# Patient Record
Sex: Female | Born: 1950 | Race: White | Hispanic: No | Marital: Married | Smoking: Never smoker
Health system: Southern US, Community
[De-identification: ages and names within clinical notes are randomized; demographics above are authoritative.]

## PROBLEM LIST (undated history)

## (undated) DIAGNOSIS — Z8619 Personal history of other infectious and parasitic diseases: Secondary | ICD-10-CM

## (undated) DIAGNOSIS — Z9981 Dependence on supplemental oxygen: Secondary | ICD-10-CM

## (undated) DIAGNOSIS — I2721 Secondary pulmonary arterial hypertension: Secondary | ICD-10-CM

## (undated) DIAGNOSIS — I509 Heart failure, unspecified: Secondary | ICD-10-CM

## (undated) DIAGNOSIS — L97509 Non-pressure chronic ulcer of other part of unspecified foot with unspecified severity: Secondary | ICD-10-CM

## (undated) DIAGNOSIS — R0609 Other forms of dyspnea: Secondary | ICD-10-CM

## (undated) DIAGNOSIS — I10 Essential (primary) hypertension: Secondary | ICD-10-CM

## (undated) DIAGNOSIS — E039 Hypothyroidism, unspecified: Secondary | ICD-10-CM

## (undated) DIAGNOSIS — L97909 Non-pressure chronic ulcer of unspecified part of unspecified lower leg with unspecified severity: Secondary | ICD-10-CM

## (undated) DIAGNOSIS — M1990 Unspecified osteoarthritis, unspecified site: Secondary | ICD-10-CM

## (undated) DIAGNOSIS — I779 Disorder of arteries and arterioles, unspecified: Secondary | ICD-10-CM

## (undated) DIAGNOSIS — R0600 Dyspnea, unspecified: Secondary | ICD-10-CM

## (undated) DIAGNOSIS — C8590 Non-Hodgkin lymphoma, unspecified, unspecified site: Secondary | ICD-10-CM

## (undated) DIAGNOSIS — R040 Epistaxis: Secondary | ICD-10-CM

## (undated) DIAGNOSIS — I272 Pulmonary hypertension, unspecified: Secondary | ICD-10-CM

## (undated) DIAGNOSIS — D649 Anemia, unspecified: Secondary | ICD-10-CM

## (undated) DIAGNOSIS — M349 Systemic sclerosis, unspecified: Secondary | ICD-10-CM

## (undated) DIAGNOSIS — C439 Malignant melanoma of skin, unspecified: Secondary | ICD-10-CM

## (undated) HISTORY — DX: Dyspnea, unspecified: R06.00

## (undated) HISTORY — DX: Personal history of other infectious and parasitic diseases: Z86.19

## (undated) HISTORY — DX: Non-pressure chronic ulcer of other part of unspecified foot with unspecified severity: L97.509

## (undated) HISTORY — DX: Secondary pulmonary arterial hypertension: I27.21

## (undated) HISTORY — DX: Essential (primary) hypertension: I10

## (undated) HISTORY — DX: Non-Hodgkin lymphoma, unspecified, unspecified site: C85.90

## (undated) HISTORY — DX: Systemic sclerosis, unspecified: M34.9

## (undated) HISTORY — PX: OTHER SURGICAL HISTORY: SHX169

## (undated) HISTORY — PX: TONSILLECTOMY AND ADENOIDECTOMY: SHX28

## (undated) HISTORY — DX: Disorder of arteries and arterioles, unspecified: I77.9

## (undated) HISTORY — DX: Malignant melanoma of skin, unspecified: C43.9

## (undated) HISTORY — DX: Other forms of dyspnea: R06.09

## (undated) HISTORY — PX: TONSILLECTOMY: SUR1361

## (undated) SURGERY — Surgical Case
Anesthesia: *Unknown

---

## 1997-12-31 DIAGNOSIS — C439 Malignant melanoma of skin, unspecified: Secondary | ICD-10-CM

## 1997-12-31 HISTORY — DX: Malignant melanoma of skin, unspecified: C43.9

## 1998-05-10 ENCOUNTER — Ambulatory Visit (HOSPITAL_COMMUNITY): Admission: RE | Admit: 1998-05-10 | Discharge: 1998-05-10 | Payer: Self-pay | Admitting: Dermatology

## 1998-10-07 ENCOUNTER — Encounter: Admission: RE | Admit: 1998-10-07 | Discharge: 1999-01-05 | Payer: Self-pay | Admitting: Internal Medicine

## 1998-10-18 ENCOUNTER — Ambulatory Visit (HOSPITAL_COMMUNITY): Admission: RE | Admit: 1998-10-18 | Discharge: 1998-10-18 | Payer: Self-pay | Admitting: Orthopedic Surgery

## 1998-10-19 ENCOUNTER — Encounter (HOSPITAL_BASED_OUTPATIENT_CLINIC_OR_DEPARTMENT_OTHER): Payer: Self-pay | Admitting: Internal Medicine

## 1998-10-31 HISTORY — PX: OTHER SURGICAL HISTORY: SHX169

## 1998-11-07 ENCOUNTER — Encounter: Admission: RE | Admit: 1998-11-07 | Discharge: 1998-11-07 | Payer: Self-pay | Admitting: Infectious Diseases

## 1998-11-10 ENCOUNTER — Ambulatory Visit (HOSPITAL_COMMUNITY): Admission: RE | Admit: 1998-11-10 | Discharge: 1998-11-10 | Payer: Self-pay | Admitting: Orthopedic Surgery

## 2000-01-05 ENCOUNTER — Ambulatory Visit (HOSPITAL_COMMUNITY): Admission: RE | Admit: 2000-01-05 | Discharge: 2000-01-05 | Payer: Self-pay | Admitting: Rheumatology

## 2000-01-29 ENCOUNTER — Encounter: Admission: RE | Admit: 2000-01-29 | Discharge: 2000-01-29 | Payer: Self-pay | Admitting: Dermatology

## 2000-01-29 ENCOUNTER — Encounter: Payer: Self-pay | Admitting: Dermatology

## 2000-02-27 ENCOUNTER — Other Ambulatory Visit: Admission: RE | Admit: 2000-02-27 | Discharge: 2000-02-27 | Payer: Self-pay | Admitting: *Deleted

## 2000-02-28 ENCOUNTER — Other Ambulatory Visit: Admission: RE | Admit: 2000-02-28 | Discharge: 2000-02-28 | Payer: Self-pay | Admitting: *Deleted

## 2000-03-22 ENCOUNTER — Encounter: Admission: RE | Admit: 2000-03-22 | Discharge: 2000-03-22 | Payer: Self-pay | Admitting: Internal Medicine

## 2000-03-22 ENCOUNTER — Encounter: Payer: Self-pay | Admitting: Internal Medicine

## 2000-10-07 ENCOUNTER — Ambulatory Visit (HOSPITAL_COMMUNITY): Admission: RE | Admit: 2000-10-07 | Discharge: 2000-10-07 | Payer: Self-pay | Admitting: Rheumatology

## 2001-01-10 ENCOUNTER — Encounter: Payer: Self-pay | Admitting: Dermatology

## 2001-01-10 ENCOUNTER — Encounter: Admission: RE | Admit: 2001-01-10 | Discharge: 2001-01-10 | Payer: Self-pay | Admitting: Dermatology

## 2001-03-26 ENCOUNTER — Encounter: Admission: RE | Admit: 2001-03-26 | Discharge: 2001-03-26 | Payer: Self-pay | Admitting: Obstetrics and Gynecology

## 2001-03-26 ENCOUNTER — Encounter: Payer: Self-pay | Admitting: Obstetrics and Gynecology

## 2001-08-08 ENCOUNTER — Ambulatory Visit (HOSPITAL_COMMUNITY): Admission: RE | Admit: 2001-08-08 | Discharge: 2001-08-08 | Payer: Self-pay | Admitting: Rheumatology

## 2002-03-30 ENCOUNTER — Encounter: Payer: Self-pay | Admitting: Obstetrics & Gynecology

## 2002-03-30 ENCOUNTER — Encounter: Admission: RE | Admit: 2002-03-30 | Discharge: 2002-03-30 | Payer: Self-pay | Admitting: Obstetrics & Gynecology

## 2002-05-15 ENCOUNTER — Ambulatory Visit (HOSPITAL_COMMUNITY): Admission: RE | Admit: 2002-05-15 | Discharge: 2002-05-15 | Payer: Self-pay | Admitting: Rheumatology

## 2003-03-25 ENCOUNTER — Other Ambulatory Visit: Admission: RE | Admit: 2003-03-25 | Discharge: 2003-03-25 | Payer: Self-pay | Admitting: Obstetrics & Gynecology

## 2003-04-13 ENCOUNTER — Encounter: Admission: RE | Admit: 2003-04-13 | Discharge: 2003-04-13 | Payer: Self-pay | Admitting: Rheumatology

## 2003-04-13 ENCOUNTER — Encounter: Payer: Self-pay | Admitting: Rheumatology

## 2003-05-26 ENCOUNTER — Ambulatory Visit (HOSPITAL_COMMUNITY): Admission: RE | Admit: 2003-05-26 | Discharge: 2003-05-26 | Payer: Self-pay | Admitting: Rheumatology

## 2004-05-08 ENCOUNTER — Other Ambulatory Visit: Admission: RE | Admit: 2004-05-08 | Discharge: 2004-05-08 | Payer: Self-pay | Admitting: Obstetrics & Gynecology

## 2004-06-05 ENCOUNTER — Encounter: Admission: RE | Admit: 2004-06-05 | Discharge: 2004-06-05 | Payer: Self-pay | Admitting: Obstetrics & Gynecology

## 2006-12-31 DIAGNOSIS — J189 Pneumonia, unspecified organism: Secondary | ICD-10-CM

## 2006-12-31 HISTORY — DX: Pneumonia, unspecified organism: J18.9

## 2010-02-28 HISTORY — PX: OTHER SURGICAL HISTORY: SHX169

## 2010-08-12 LAB — HM MAMMOGRAPHY

## 2011-06-04 LAB — HM PAP SMEAR

## 2011-06-19 ENCOUNTER — Encounter: Payer: Self-pay | Admitting: Internal Medicine

## 2011-08-10 ENCOUNTER — Encounter: Payer: Self-pay | Admitting: Internal Medicine

## 2011-08-10 ENCOUNTER — Ambulatory Visit (INDEPENDENT_AMBULATORY_CARE_PROVIDER_SITE_OTHER): Payer: BC Managed Care – PPO | Admitting: Internal Medicine

## 2011-08-10 DIAGNOSIS — F3289 Other specified depressive episodes: Secondary | ICD-10-CM

## 2011-08-10 DIAGNOSIS — I10 Essential (primary) hypertension: Secondary | ICD-10-CM | POA: Insufficient documentation

## 2011-08-10 DIAGNOSIS — G629 Polyneuropathy, unspecified: Secondary | ICD-10-CM

## 2011-08-10 DIAGNOSIS — Z9109 Other allergy status, other than to drugs and biological substances: Secondary | ICD-10-CM

## 2011-08-10 DIAGNOSIS — F329 Major depressive disorder, single episode, unspecified: Secondary | ICD-10-CM

## 2011-08-10 DIAGNOSIS — G589 Mononeuropathy, unspecified: Secondary | ICD-10-CM

## 2011-08-10 DIAGNOSIS — F32A Depression, unspecified: Secondary | ICD-10-CM

## 2011-08-10 DIAGNOSIS — I739 Peripheral vascular disease, unspecified: Secondary | ICD-10-CM

## 2011-08-10 DIAGNOSIS — I743 Embolism and thrombosis of arteries of the lower extremities: Secondary | ICD-10-CM | POA: Insufficient documentation

## 2011-08-10 DIAGNOSIS — L940 Localized scleroderma [morphea]: Secondary | ICD-10-CM

## 2011-08-10 DIAGNOSIS — I779 Disorder of arteries and arterioles, unspecified: Secondary | ICD-10-CM | POA: Insufficient documentation

## 2011-08-10 DIAGNOSIS — E785 Hyperlipidemia, unspecified: Secondary | ICD-10-CM

## 2011-08-10 DIAGNOSIS — Z0000 Encounter for general adult medical examination without abnormal findings: Secondary | ICD-10-CM

## 2011-08-10 DIAGNOSIS — Z889 Allergy status to unspecified drugs, medicaments and biological substances status: Secondary | ICD-10-CM

## 2011-08-10 MED ORDER — TRAMADOL HCL 50 MG PO TABS: 50.0000 mg | ORAL_TABLET | Freq: Four times a day (QID) | ORAL | Status: DC | PRN

## 2011-08-10 MED ORDER — CITALOPRAM HYDROBROMIDE 20 MG PO TABS: 20.0000 mg | ORAL_TABLET | Freq: Every day | ORAL | Status: DC

## 2011-08-10 NOTE — Progress Notes (Signed)
Subjective:    [name], female    DOB: 29-Jul-1951, 60 y.o.   MRN: [id number]  HPI Patient is a new patient to establish with a history of scleroderma.  She has been told in the past that she has Raynaud's and peripheral artery occlusive disease scleroderma which has been seemingly localized to her skin hypertension which has been moderately well controlled and hyperlipidemia.  She has never been on an ACE inhibitor she has taken steroids for her scleroderma in the past.  When they lived in the [place] area she had both a rheumatologist and a cardiologist.  Those records will be obtained and reviewed. She did not have primary care physician nor has she had a recent physical examination.  She is unaware of how her scleroderma was diagnosed specifically she is unaware of an ANA titer or any sedimentation rate  information.  She certainly takes gabapentin and tramadol for pain to lower extremities and is on Celexa for depression he takes Lipitor for hyperlipidemia    Review of Systems  Constitutional: Negative for activity change, appetite change and fatigue.  HENT: Negative for ear pain, congestion, neck pain, postnasal drip and sinus pressure.   Eyes: Negative for redness and visual disturbance.  Respiratory: Positive for shortness of breath. Negative for cough and wheezing.   Cardiovascular: Positive for palpitations and leg swelling.  Gastrointestinal: Negative for abdominal pain and abdominal distention.  Genitourinary: Negative for dysuria, frequency and menstrual problem.  Musculoskeletal: Negative for myalgias, joint swelling and arthralgias.  Skin: Negative for rash and wound.  Neurological: Negative for dizziness, weakness and headaches.  Hematological: Negative for adenopathy. Does not bruise/bleed easily.  Psychiatric/Behavioral: Negative for sleep disturbance and decreased concentration.   Past Medical History  Diagnosis Date  . Cancer   . History  of chicken pox   . Hypertension   . Sclerodermia generalized   . Non Hodgkin's lymphoma   . Melanoma 1999   Past Surgical History  Procedure Date  . Rt little toe removal 10/1998  . Stent left thigh 3/11  . Tonsillectomy and adenoidectomy   . Small intestine surgery     reports that she has never smoked. She does not have any smokeless tobacco history on file. She reports that she does not drink alcohol or use illicit drugs. family history includes Hyperlipidemia in her mother; Lupus in her father; and Lymphoma in her father. Allergies  Allergen Reactions  . Penicillins        Objective:   Physical Exam  Nursing note and vitals reviewed. Constitutional: She is oriented to person, place, and time. She appears well-developed and well-nourished. No distress.  HENT:  Head: Normocephalic and atraumatic.  Right Ear: External ear normal.  Left Ear: External ear normal.  Nose: Nose normal.  Mouth/Throat: Oropharynx is clear and moist.  Eyes: Conjunctivae and EOM are normal. Pupils are equal, round, and reactive to light.  Neck: Normal range of motion. Neck supple. No JVD present. No tracheal deviation present. No thyromegaly present.  Cardiovascular: Normal rate, regular rhythm, normal heart sounds and intact distal pulses.   No murmur heard. Pulmonary/Chest: Effort normal and breath sounds normal. She has no wheezes. She exhibits no tenderness.  Abdominal: Soft. Bowel sounds are normal.  Musculoskeletal: Normal range of motion. She exhibits no edema and no tenderness.  Lymphadenopathy:    She has no cervical adenopathy.  Neurological: She is alert and oriented to person, place, and time. She has normal reflexes. No cranial nerve  deficit.  Skin: Skin is warm and dry. She is not diaphoretic.  Psychiatric: She has a normal mood and affect. Her behavior is normal.          Assessment & Plan:  Ace for scleroderma? We will review an ANA and also her previous rheumatological  history she is on Aldactone to for blood pressure. I do believe that she may benefit from the addition of an Ace especially if the diagnosis of scleroderma is true we will obtain appropriate lab work to confirm this diagnosis as well as review what her vitamin D levels are her lipids and liver panel and look at a sedimentation rate.  I believe this complicated patient will require a team to manage her including cardiology for a history of hypertension peripheral vascular disease and rheumatology if indeed the diagnosis of scleroderma as correct.  We have asked the patient to obtain records from previous physicians which over the

## 2011-08-10 NOTE — Patient Instructions (Signed)
b12 dots subligual 500 mg twice a day

## 2011-08-11 LAB — BASIC METABOLIC PANEL
BUN: 23 mg/dL (ref 6–23)
CO2: 25 mEq/L (ref 19–32)
Calcium: 10.2 mg/dL (ref 8.4–10.5)
Chloride: 97 mEq/L (ref 96–112)
Creat: 1.1 mg/dL (ref 0.50–1.10)
Glucose, Bld: 86 mg/dL (ref 70–99)
Potassium: 3.9 mEq/L (ref 3.5–5.3)
Sodium: 140 mEq/L (ref 135–145)

## 2011-08-11 LAB — VITAMIN B12: Vitamin B-12: 781 pg/mL (ref 211–911)

## 2011-08-11 LAB — SEDIMENTATION RATE: Sed Rate: 34 mm/hr — ABNORMAL HIGH (ref 0–22)

## 2011-08-13 LAB — ANTI-NUCLEAR AB-TITER (ANA TITER): ANA Titer 1: 1:320 {titer} — ABNORMAL HIGH

## 2011-08-13 LAB — ANA: Anti Nuclear Antibody(ANA): POSITIVE — AB

## 2011-10-12 ENCOUNTER — Other Ambulatory Visit: Payer: Self-pay | Admitting: *Deleted

## 2011-10-12 ENCOUNTER — Ambulatory Visit (INDEPENDENT_AMBULATORY_CARE_PROVIDER_SITE_OTHER): Payer: BC Managed Care – PPO | Admitting: Internal Medicine

## 2011-10-12 VITALS — BP 120/74 | HR 72 | Temp 98.2°F | Resp 16 | Ht 68.0 in | Wt 152.0 lb

## 2011-10-12 DIAGNOSIS — M792 Neuralgia and neuritis, unspecified: Secondary | ICD-10-CM

## 2011-10-12 DIAGNOSIS — G5790 Unspecified mononeuropathy of unspecified lower limb: Secondary | ICD-10-CM

## 2011-10-12 MED ORDER — AMITRIPTYLINE HCL 10 MG PO TABS: 10.0000 mg | ORAL_TABLET | Freq: Every day | ORAL | Status: DC

## 2011-10-12 NOTE — Progress Notes (Signed)
  Subjective:    [name], female    DOB: 16-Jan-1951, 60 y.o.   MRN: [id number]  HPI Pt will continue with the [facility] Rheumatologist and cardiologist She will need an echo to look at right sided heart pressures This has been set up for march The [place] information is in the  Chart [facility]   At [facility] The cardiologist is a [place]  The ANA was normal for her per the rheumatologist   Review of Systems  Constitutional: Negative for activity change, appetite change and fatigue.  HENT: Negative for ear pain, congestion, neck pain, postnasal drip and sinus pressure.   Eyes: Negative for redness and visual disturbance.  Respiratory: Negative for cough, shortness of breath and wheezing.   Gastrointestinal: Negative for abdominal pain and abdominal distention.  Genitourinary: Negative for dysuria, frequency and menstrual problem.  Musculoskeletal: Negative for myalgias, joint swelling and arthralgias.  Skin: Negative for rash and wound.  Neurological: Negative for dizziness, weakness and headaches.  Hematological: Negative for adenopathy. Does not bruise/bleed easily.  Psychiatric/Behavioral: Negative for sleep disturbance and decreased concentration.   Past Medical History  Diagnosis Date  . Cancer   . History of chicken pox   . Hypertension   . Sclerodermia generalized   . Non Hodgkin's lymphoma   . Melanoma 1999   Past Surgical History  Procedure Date  . Rt little toe removal 10/1998  . Stent left thigh 3/11  . Tonsillectomy and adenoidectomy   . Small intestine surgery     reports that she has never smoked. She does not have any smokeless tobacco history on file. She reports that she does not drink alcohol or use illicit drugs. family history includes Hyperlipidemia in her mother; Lupus in her father; and Lymphoma in her father. Allergies  Allergen Reactions  . Penicillins        Objective:   Physical Exam  Nursing note and vitals  reviewed. Constitutional: She is oriented to person, place, and time. She appears well-developed and well-nourished. No distress.  HENT:  Head: Normocephalic and atraumatic.  Right Ear: External ear normal.  Left Ear: External ear normal.  Nose: Nose normal.  Mouth/Throat: Oropharynx is clear and moist.  Eyes: Conjunctivae and EOM are normal. Pupils are equal, round, and reactive to light.  Neck: Normal range of motion. Neck supple. No JVD present. No tracheal deviation present. No thyromegaly present.  Cardiovascular: Normal rate, regular rhythm, normal heart sounds and intact distal pulses.   No murmur heard. Pulmonary/Chest: Effort normal and breath sounds normal. She has no wheezes. She exhibits no tenderness.  Abdominal: Soft. Bowel sounds are normal.  Musculoskeletal: Normal range of motion. She exhibits no edema and no tenderness.  Lymphadenopathy:    She has no cervical adenopathy.  Neurological: She is alert and oriented to person, place, and time. She has normal reflexes. No cranial nerve deficit.  Skin: Skin is warm and dry. She is not diaphoretic.  Psychiatric: She has a normal mood and affect. Her behavior is normal.          Assessment & Plan:  Neuropathy not improved Started lyrica 4 weeks a go on 100 BID Trial of elavil 25

## 2011-10-12 NOTE — Patient Instructions (Signed)
Patient was instructed to continue all medications as prescribed. To stop at the checkout desk and schedule a followup appointment

## 2011-11-19 ENCOUNTER — Encounter (HOSPITAL_COMMUNITY): Payer: Self-pay | Admitting: *Deleted

## 2011-11-19 ENCOUNTER — Emergency Department (HOSPITAL_COMMUNITY)
Admission: EM | Admit: 2011-11-19 | Discharge: 2011-11-19 | Disposition: A | Discharge status: Home / Self Care | Payer: BC Managed Care – PPO | Attending: Emergency Medicine | Admitting: Emergency Medicine

## 2011-11-19 ENCOUNTER — Encounter (HOSPITAL_COMMUNITY): Payer: Self-pay | Admitting: Emergency Medicine

## 2011-11-19 ENCOUNTER — Emergency Department (HOSPITAL_COMMUNITY)
Admission: EM | Admit: 2011-11-19 | Discharge: 2011-11-19 | Disposition: A | Discharge status: Home / Self Care | Payer: BC Managed Care – PPO | Source: Home / Self Care | Attending: Emergency Medicine | Admitting: Emergency Medicine

## 2011-11-19 DIAGNOSIS — Z7982 Long term (current) use of aspirin: Secondary | ICD-10-CM | POA: Insufficient documentation

## 2011-11-19 DIAGNOSIS — R040 Epistaxis: Secondary | ICD-10-CM | POA: Insufficient documentation

## 2011-11-19 DIAGNOSIS — J3489 Other specified disorders of nose and nasal sinuses: Secondary | ICD-10-CM | POA: Insufficient documentation

## 2011-11-19 DIAGNOSIS — Z79899 Other long term (current) drug therapy: Secondary | ICD-10-CM | POA: Insufficient documentation

## 2011-11-19 DIAGNOSIS — I10 Essential (primary) hypertension: Secondary | ICD-10-CM | POA: Insufficient documentation

## 2011-11-19 MED ORDER — COCAINE HCL 4 % EX SOLN
4.0000 mL | Freq: Once | CUTANEOUS | Status: AC
  Administered 2011-11-19: 4 mL via NASAL
  Filled 2011-11-19: qty 4

## 2011-11-19 NOTE — ED Notes (Signed)
Pt states she started having a nose bleed 3 hours ago

## 2011-11-19 NOTE — ED Notes (Signed)
Pt was discharged from ER with packing to left nares. Pt blew nose and some clots came out. Pt returned for further eval. Packing remains in place. No active bleeding noted.

## 2011-11-19 NOTE — ED Notes (Signed)
Pt sitting quietly in bed. No distress noted. No active bleeding noted.

## 2011-11-19 NOTE — ED Provider Notes (Addendum)
History     CSN: [id number] Arrival date & time: 11/19/2011  2:32 AM   First MD Initiated Contact with Patient 11/19/11 0252      Chief Complaint  Patient presents with  . Epistaxis    (Consider location/radiation/quality/duration/timing/severity/associated sxs/prior treatment) HPI Patient returns the emergency room this evening after being discharged short time ago with complaints of recurrent nosebleed.  Patient has history of perforated nasal septum. While in the emergency room earlier she had her left nares packed with a Rhinostat. Patient went out to the car this evening and tried to blow her nose to clear out the right nares. She started to feel that the blood was coming out of the left side again so she returned to the emergency room. Currently the bleeding has stopped but the nasal packing does look bloodier than  before.    . Past Medical History  Diagnosis Date  . Cancer   . History of chicken pox   . Hypertension   . Sclerodermia generalized   . Non Hodgkin's lymphoma   . Melanoma 1999    Past Surgical History  Procedure Date  . Rt little toe removal 10/1998  . Stent left thigh 3/11  . Tonsillectomy and adenoidectomy   . Small intestine surgery     Family History  Problem Relation Age of Onset  . Lupus Father   . Lymphoma Father   . Hyperlipidemia Mother     History  Substance Use Topics  . Smoking status: Never Smoker   . Smokeless tobacco: Not on file  . Alcohol Use: Yes     couple of times a week    OB History    Grav Para Term Preterm Abortions TAB SAB Ect Mult Living                  Review of Systems  All other systems reviewed and are negative.    Allergies  Penicillins  Home Medications   Current Outpatient Rx  Name Route Sig Dispense Refill  . AMITRIPTYLINE HCL 10 MG PO TABS Oral Take 1 tablet (10 mg total) by mouth at bedtime. 30 tablet 11  . ASPIRIN 81 MG PO TABS Oral Take 81 mg by mouth daily.      . ATORVASTATIN CALCIUM  40 MG PO TABS Oral Take 40 mg by mouth daily.      . AZELASTINE HCL 137 MCG/SPRAY NA SOLN Nasal Place 1 spray into the nose daily. Use in each nostril as directed    . VITAMIN D 2000 UNITS PO CAPS Oral Take by mouth daily.      Marland Kitchen CITALOPRAM HYDROBROMIDE 20 MG PO TABS Oral Take 1 tablet (20 mg total) by mouth daily. 30 tablet 11  . CLOPIDOGREL BISULFATE 75 MG PO TABS Oral Take 75 mg by mouth daily.      . FUROSEMIDE 20 MG PO TABS Oral Take 20 mg by mouth 2 (two) times daily.      Marland Kitchen GABAPENTIN 300 MG PO CAPS Oral Take 1 tablet by mouth 2 (two) times daily.    . MOMETASONE FUROATE 50 MCG/ACT NA SUSP Nasal Place 2 sprays into the nose daily.      . MULTIVITAMINS PO TABS Oral Take 1 tablet by mouth daily.      Marland Kitchen MUPIROCIN 2 % EX OINT Oral Take 1 application by mouth 2 (two) times daily.    Marland Kitchen NEXIUM 40 MG PO CPDR Oral Take 1 tablet by mouth Daily.    Marland Kitchen  OMEGA 3 1000 MG PO CAPS Oral Take 3 capsules by mouth daily.      Marland Kitchen SPIRONOLACTONE 25 MG PO TABS Oral Take 25 mg by mouth daily.        There were no vitals taken for this visit.  Physical Exam  Nursing note and vitals reviewed. Constitutional: She appears well-developed and well-nourished. No distress.  HENT:  Head: Normocephalic and atraumatic.  Right Ear: External ear normal.  Left Ear: External ear normal.       No blood noted in posterior pharynx, Rhinostat in the left nares, no active bleeding noted from the left or right time.  Eyes: Conjunctivae are normal. Right eye exhibits no discharge. Left eye exhibits no discharge. No scleral icterus.  Neck: Neck supple. No tracheal deviation present.  Cardiovascular: Normal rate.   Pulmonary/Chest: Effort normal. No stridor. No respiratory distress.  Musculoskeletal: She exhibits no edema.  Neurological: She is alert. Cranial nerve deficit: no gross deficits.  Skin: Skin is warm and dry. No rash noted.  Psychiatric: She has a normal mood and affect.    ED Course  Procedures (including  critical care time)  Labs Reviewed - No data to display No results found.   1. Epistaxis       MDM  The patient was monitored in the emergency room for a couple of hours. There is no return of the epistaxis. Her packing remained in place        Kathalene Frames, MD 11/19/11 5929  Kathalene Frames, MD 11/27/11 [phone]

## 2011-11-19 NOTE — ED Notes (Signed)
Pt provided with warm blanket and diet sprite. Husband provided with diet sprite as well. No active bleeding noted.

## 2011-11-19 NOTE — ED Notes (Signed)
Pt sitting in bed quietly. No active bleeding noted.

## 2011-11-19 NOTE — ED Provider Notes (Signed)
History     CSN: [id number] Arrival date & time: 11/19/2011 12:42 AM   First MD Initiated Contact with Patient 11/19/11 0049      Chief Complaint  Patient presents with  . Epistaxis    (Consider location/radiation/quality/duration/timing/severity/associated sxs/prior treatment) HPI Comments: The patient denies any history of bleeding disorder. She's not having any numbness or weakness.  Patient is a 60 y.o. female presenting with nosebleeds. The history is provided by the patient.  Epistaxis  This is a new problem. The current episode started 3 to 5 hours ago. The problem occurs constantly. The problem has not changed since onset.The problem is associated with aspirin. The bleeding has been from both nares. She has tried applying pressure for the symptoms. The treatment provided mild relief. Her past medical history does not include sinus problems or frequent nosebleeds. Past medical history comments: The patient does have a history of a perforated nasal septum.    Past Medical History  Diagnosis Date  . Cancer   . History of chicken pox   . Hypertension   . Sclerodermia generalized   . Non Hodgkin's lymphoma   . Melanoma 1999    Past Surgical History  Procedure Date  . Rt little toe removal 10/1998  . Stent left thigh 3/11  . Tonsillectomy and adenoidectomy   . Small intestine surgery     Family History  Problem Relation Age of Onset  . Lupus Father   . Lymphoma Father   . Hyperlipidemia Mother     History  Substance Use Topics  . Smoking status: Never Smoker   . Smokeless tobacco: Not on file  . Alcohol Use: Yes     couple of times a week    OB History    Grav Para Term Preterm Abortions TAB SAB Ect Mult Living                  Review of Systems  HENT: Positive for nosebleeds.   All other systems reviewed and are negative.    Allergies  Penicillins  Home Medications   Current Outpatient Rx  Name Route Sig Dispense Refill  . AMITRIPTYLINE HCL  10 MG PO TABS Oral Take 1 tablet (10 mg total) by mouth at bedtime. 30 tablet 11  . ASPIRIN 81 MG PO TABS Oral Take 81 mg by mouth daily.      . ATORVASTATIN CALCIUM 40 MG PO TABS Oral Take 40 mg by mouth daily.      . AZELASTINE HCL 137 MCG/SPRAY NA SOLN Nasal Place 1 spray into the nose 2 (two) times daily. Use in each nostril as directed     . VITAMIN D 2000 UNITS PO CAPS Oral Take by mouth daily.      Marland Kitchen CITALOPRAM HYDROBROMIDE 20 MG PO TABS Oral Take 1 tablet (20 mg total) by mouth daily. 30 tablet 11  . CLOPIDOGREL BISULFATE 75 MG PO TABS Oral Take 75 mg by mouth daily.      . FUROSEMIDE 20 MG PO TABS Oral Take 20 mg by mouth 2 (two) times daily.      . MOMETASONE FUROATE 50 MCG/ACT NA SUSP Nasal Place 2 sprays into the nose daily.      . MULTIVITAMINS PO TABS Oral Take 1 tablet by mouth daily.      . OMEGA 3 1000 MG PO CAPS Oral Take 3 capsules by mouth daily.      Marland Kitchen PREGABALIN 100 MG PO CAPS Oral Take 100 mg by mouth  2 (two) times daily.      Marland Kitchen SPIRONOLACTONE 25 MG PO TABS Oral Take 25 mg by mouth daily.        BP 161/92  Pulse 97  Temp(Src) 98.7 F (37.1 C) (Oral)  Resp 18  SpO2 94%  Physical Exam  Nursing note and vitals reviewed. Constitutional: She appears well-developed and well-nourished. No distress.  HENT:  Head: Normocephalic and atraumatic.  Right Ear: External ear normal.  Left Ear: External ear normal.  Nose: Epistaxis is observed.  Mouth/Throat: No oropharyngeal exudate.       Perforated nasal septum, clotted blood in both nares  Eyes: Conjunctivae are normal. Right eye exhibits no discharge. Left eye exhibits no discharge. No scleral icterus.  Neck: Neck supple. No tracheal deviation present.  Cardiovascular: Normal rate and regular rhythm.   Pulmonary/Chest: Effort normal and breath sounds normal. No stridor. No respiratory distress. She has no wheezes. She has no rales.  Musculoskeletal: She exhibits no edema.  Neurological: She is alert. Cranial nerve  deficit: no gross deficits.  Skin: Skin is warm and dry. No rash noted.  Psychiatric: She has a normal mood and affect.    ED Course  EPISTAXIS MANAGEMENT Date/Time: 11/19/2011 1:39 AM Performed by: Dorie Rank R Authorized by: Dorie Rank R Consent: Verbal consent obtained. Risks and benefits: risks, benefits and alternatives were discussed Consent given by: patient Patient sedated: no Treatment site: left anterior Repair method: nasal balloon (Rhinostat anterior 5.5 cm  packing) Post-procedure assessment: bleeding stopped Treatment complexity: simple Patient tolerance: Patient tolerated the procedure well with no immediate complications.   (including critical care time)  Labs Reviewed - No data to display No results found.  Diagnosis: Epistaxis   MDM  Patient monitored in the ED. No further bleeding noted. He should be discharged home to followup with an ear nose and throat doctor.        Kathalene Frames, MD 11/19/11 [phone]

## 2011-12-05 ENCOUNTER — Encounter: Payer: Self-pay | Admitting: Internal Medicine

## 2011-12-05 ENCOUNTER — Ambulatory Visit (INDEPENDENT_AMBULATORY_CARE_PROVIDER_SITE_OTHER): Payer: BC Managed Care – PPO | Admitting: Internal Medicine

## 2011-12-05 ENCOUNTER — Ambulatory Visit: Payer: BC Managed Care – PPO | Admitting: Internal Medicine

## 2011-12-05 VITALS — BP 130/80 | HR 76 | Temp 98.2°F | Resp 16 | Ht 68.0 in | Wt 152.0 lb

## 2011-12-05 DIAGNOSIS — I998 Other disorder of circulatory system: Secondary | ICD-10-CM

## 2011-12-05 DIAGNOSIS — I999 Unspecified disorder of circulatory system: Secondary | ICD-10-CM

## 2011-12-05 DIAGNOSIS — G5790 Unspecified mononeuropathy of unspecified lower limb: Secondary | ICD-10-CM

## 2011-12-05 DIAGNOSIS — R300 Dysuria: Secondary | ICD-10-CM

## 2011-12-05 DIAGNOSIS — I872 Venous insufficiency (chronic) (peripheral): Secondary | ICD-10-CM

## 2011-12-05 DIAGNOSIS — M792 Neuralgia and neuritis, unspecified: Secondary | ICD-10-CM

## 2011-12-05 LAB — POCT URINALYSIS DIPSTICK
Bilirubin, UA: NEGATIVE
Blood, UA: NEGATIVE
Glucose, UA: NEGATIVE
Ketones, UA: NEGATIVE
Nitrite, UA: NEGATIVE
Protein, UA: NEGATIVE
Spec Grav, UA: <=1.005
Urobilinogen, UA: 0.2
pH, UA: 5.5

## 2011-12-05 MED ORDER — AMITRIPTYLINE HCL 25 MG PO TABS: 25.0000 mg | ORAL_TABLET | Freq: Every day | ORAL | Status: DC

## 2011-12-05 MED ORDER — CIPROFLOXACIN HCL 250 MG PO TABS: 250.0000 mg | ORAL_TABLET | Freq: Two times a day (BID) | ORAL | Status: AC

## 2011-12-05 NOTE — Progress Notes (Signed)
Subjective:    [name], female    DOB: 1951-06-13, 60 y.o.   MRN: [id number]  HPI  Follow up for CTD, HTN and depression Elavil started for the neuropathy The pt cannot detect any change in the neuropathy bu she sleeps better at night Has change back to neurontin from lyrica for the same diagnosis The pt has been on b12 orally Blood pressure stable Diagnosed with scleroderma seeing once a year Had been on reclast for bone density and the specialist has recommended skipping a year  Review of Systems  Constitutional: Positive for fatigue.  HENT: Negative.   Eyes: Negative.   Respiratory: Positive for wheezing.   Genitourinary: Positive for frequency.  Musculoskeletal: Positive for arthralgias.  Skin: Positive for color change.  Neurological: Positive for weakness and light-headedness.  / Past Medical History  Diagnosis Date  . Cancer   . History of chicken pox   . Hypertension   . Sclerodermia generalized   . Non Hodgkin's lymphoma   . Melanoma 1999    History   Social History  . Marital Status: Married    Spouse Name: N/A    Number of Children: N/A  . Years of Education: N/A   Occupational History  . Not on file.   Social History Main Topics  . Smoking status: Never Smoker   . Smokeless tobacco: Not on file  . Alcohol Use: Yes     couple of times a week  . Drug Use: No  . Sexually Active: Not Currently   Other Topics Concern  . Not on file   Social History Narrative  . No narrative on file    Past Surgical History  Procedure Date  . Rt little toe removal 10/1998  . Stent left thigh 3/11  . Tonsillectomy and adenoidectomy   . Small intestine surgery     Family History  Problem Relation Age of Onset  . Lupus Father   . Lymphoma Father   . Hyperlipidemia Mother     Allergies  Allergen Reactions  . Penicillins Rash    Current Outpatient Prescriptions on File Prior to Visit  Medication Sig Dispense Refill  . aspirin 81 MG  tablet Take 81 mg by mouth daily.        Marland Kitchen atorvastatin (LIPITOR) 40 MG tablet Take 40 mg by mouth daily.        Marland Kitchen azelastine (ASTELIN) 137 MCG/SPRAY nasal spray Place 1 spray into the nose daily. Use in each nostril as directed      . Cholecalciferol (VITAMIN D) 2000 UNITS CAPS Take by mouth daily.        . citalopram (CELEXA) 20 MG tablet Take 1 tablet (20 mg total) by mouth daily.  30 tablet  11  . clopidogrel (PLAVIX) 75 MG tablet Take 75 mg by mouth daily.        . furosemide (LASIX) 20 MG tablet Take 20 mg by mouth 2 (two) times daily.        Marland Kitchen gabapentin (NEURONTIN) 300 MG capsule Take 1 tablet by mouth 2 (two) times daily.      . mometasone (NASONEX) 50 MCG/ACT nasal spray Place 2 sprays into the nose daily.        . multivitamin (THERAGRAN) per tablet Take 1 tablet by mouth daily.        . mupirocin ointment (BACTROBAN) 2 % Take 1 application by mouth 2 (two) times daily.      Marland Kitchen NEXIUM 40 MG capsule  Take 1 tablet by mouth Daily.      . OMEGA 3 1000 MG CAPS Take 3 capsules by mouth daily.        Marland Kitchen spironolactone (ALDACTONE) 25 MG tablet Take 25 mg by mouth daily.          BP 130/80  Pulse 76  Temp 98.2 F (36.8 C)  Resp 16  Ht 5' 8" (1.727 m)  Wt 152 lb (68.947 kg)  BMI 23.11 kg/m2        Objective:   Physical Exam  Nursing note and vitals reviewed.  Patient is a 60 year old white female HEENT reveals pupils are equal round reactive to light and accommodation neck is supple without JVD or peduncles were clear to auscultation and percussion heart examination revealed regular rate and rhythm with 1/6 systolic murmur abdomen soft bowel sounds present all 4 quadrants examination of the upper extremities revealed small ulcerations on the fingertips and scleroderma changes. Examination of lower extremities reveals 2+ edema good capillary refill multiple small ulcerations and chronic skin changes       Assessment & Plan:  Scleroderma stable per her  rheumatologist.  Peripheral neuropathy increase Elavil to 25 mg by mouth each bedtime both venous and vascular insufficiency of the lower subcutaneous followed by vascular surgeon at 10 pound compression stockings for the most of the edema and poor wound healing  check urinalysis for frequency some

## 2011-12-05 NOTE — Progress Notes (Signed)
Addended by: Allyne Gee on: 12/05/2011 12:50 PM   Modules accepted: Orders

## 2011-12-05 NOTE — Patient Instructions (Addendum)
The patient is instructed to continue all medications as prescribed. Schedule followup with check out clerk upon leaving the clinic Get the hose for compression of the legs

## 2011-12-12 ENCOUNTER — Ambulatory Visit: Payer: BC Managed Care – PPO | Admitting: Internal Medicine

## 2012-01-08 ENCOUNTER — Encounter: Payer: Self-pay | Admitting: Family

## 2012-01-08 ENCOUNTER — Telehealth: Payer: Self-pay | Admitting: *Deleted

## 2012-01-08 ENCOUNTER — Ambulatory Visit (INDEPENDENT_AMBULATORY_CARE_PROVIDER_SITE_OTHER): Payer: BC Managed Care – PPO | Admitting: Family

## 2012-01-08 VITALS — BP 120/70 | Temp 97.5°F | Wt 158.0 lb

## 2012-01-08 DIAGNOSIS — L299 Pruritus, unspecified: Secondary | ICD-10-CM

## 2012-01-08 DIAGNOSIS — T7840XA Allergy, unspecified, initial encounter: Secondary | ICD-10-CM

## 2012-01-08 DIAGNOSIS — M349 Systemic sclerosis, unspecified: Secondary | ICD-10-CM

## 2012-01-08 DIAGNOSIS — Z888 Allergy status to other drugs, medicaments and biological substances status: Secondary | ICD-10-CM

## 2012-01-08 MED ORDER — PREDNISONE 20 MG PO TABS: 20.0000 mg | ORAL_TABLET | Freq: Every day | ORAL | Status: AC

## 2012-01-08 MED ORDER — FLUCONAZOLE 150 MG PO TABS: 150.0000 mg | ORAL_TABLET | Freq: Once | ORAL | Status: AC

## 2012-01-08 MED ORDER — METHYLPREDNISOLONE ACETATE PF 40 MG/ML IJ SUSP
80.0000 mg | Freq: Once | INTRAMUSCULAR | Status: AC
  Administered 2012-01-08: 80 mg via INTRAMUSCULAR

## 2012-01-08 NOTE — Telephone Encounter (Signed)
Keep OV for today.

## 2012-01-08 NOTE — Telephone Encounter (Signed)
Pt is taking Zpack and Bactrim x one week or more, and has had hives x 2-3 days.  Spoke to Dr. Allyson Sabal, and he advised her to start Benadry 50 mg. q 4 hours, and stop Bactrim.  She has done that, but is still having residual rash, and has progressed to her face.  No SOB, throat closing, or other symptoms of of anaphylactic reaction.  Itching has stopped.  Pt was sheduled this afternoon with go see one of the MDs here.  Advised if any other symptoms (as above) to go straight to ER., or call 911

## 2012-01-08 NOTE — Progress Notes (Signed)
Subjective:    [name], female    DOB: 1951/09/20, 61 y.o.   MRN: [id number]  HPI 61 year old white female who complete allergic reaction 2 possibly Bactrim. She was started on Bactrim by the dermatologist for management of her scleroderma. She is contacted the dermatologist who his encouraged her to take Benadryl for symptoms, and it has helped some but she continues to have swelling and itching. She is also on Zithromax for respiratory infection. She has tolerated Zithromax 5 in the past. To her knowledge, she is never been on Bactrim. She denies any shortness of breath, chest pain, palpitations, nausea or vomiting.   Review of Systems  HENT: Positive for facial swelling.   Eyes: Negative.   Respiratory: Negative.   Cardiovascular: Negative.   Skin:        Scleroderma. A red, itchy , rash noted to the arms and chest.  Neurological: Negative.   Hematological: Negative.        Past Medical History  Diagnosis Date  . Cancer   . History of chicken pox   . Hypertension   . Sclerodermia generalized   . Non Hodgkin's lymphoma   . Melanoma 1999    History   Social History  . Marital Status: Married    Spouse Name: N/A    Number of Children: N/A  . Years of Education: N/A   Occupational History  . Not on file.   Social History Main Topics  . Smoking status: Never Smoker   . Smokeless tobacco: Not on file  . Alcohol Use: Yes     couple of times a week  . Drug Use: No  . Sexually Active: Not Currently   Other Topics Concern  . Not on file   Social History Narrative  . No narrative on file    Past Surgical History  Procedure Date  . Rt little toe removal 10/1998  . Stent left thigh 3/11  . Tonsillectomy and adenoidectomy   . Small intestine surgery     Family History  Problem Relation Age of Onset  . Lupus Father   . Lymphoma Father   . Hyperlipidemia Mother     Allergies  Allergen Reactions  . Penicillins Rash    Current Outpatient  Prescriptions on File Prior to Visit  Medication Sig Dispense Refill  . amitriptyline (ELAVIL) 25 MG tablet Take 1 tablet (25 mg total) by mouth at bedtime.  30 tablet  11  . aspirin 81 MG tablet Take 81 mg by mouth daily.        Marland Kitchen atorvastatin (LIPITOR) 40 MG tablet Take 40 mg by mouth daily.        Marland Kitchen azelastine (ASTELIN) 137 MCG/SPRAY nasal spray Place 1 spray into the nose daily. Use in each nostril as directed      . Cholecalciferol (VITAMIN D) 2000 UNITS CAPS Take by mouth daily.        . citalopram (CELEXA) 20 MG tablet Take 1 tablet (20 mg total) by mouth daily.  30 tablet  11  . clopidogrel (PLAVIX) 75 MG tablet Take 75 mg by mouth daily.        . furosemide (LASIX) 20 MG tablet Take 20 mg by mouth 2 (two) times daily.        Marland Kitchen gabapentin (NEURONTIN) 300 MG capsule Take 1 tablet by mouth 2 (two) times daily.      . mometasone (NASONEX) 50 MCG/ACT nasal spray Place 2 sprays into the nose  daily.        . multivitamin (THERAGRAN) per tablet Take 1 tablet by mouth daily.        . mupirocin ointment (BACTROBAN) 2 % Take 1 application by mouth 2 (two) times daily.      Marland Kitchen NEXIUM 40 MG capsule Take 1 tablet by mouth Daily.      . OMEGA 3 1000 MG CAPS Take 3 capsules by mouth daily.        Marland Kitchen spironolactone (ALDACTONE) 25 MG tablet Take 25 mg by mouth daily.        . vitamin B-12 (CYANOCOBALAMIN) 500 MCG tablet Take 500 mcg by mouth daily.         No current facility-administered medications on file prior to visit.    BP 120/70  Temp(Src) 97.5 F (36.4 C) (Oral)  Wt 158 lb (71.668 kg)chart Objective:   Physical Exam  Constitutional: She is oriented to person, place, and time. She appears well-developed and well-nourished.  HENT:  Right Ear: External ear normal.  Nose: Nose normal.  Mouth/Throat: Oropharynx is clear and moist.       Mild facial swelling noted, mild , pruritic rash noted. No difficulty breathing.  Neck: Normal range of motion. Neck supple.  Cardiovascular: Normal rate,  regular rhythm and normal heart sounds.   Pulmonary/Chest: Effort normal and breath sounds normal.       Great air movement.  Neurological: She is alert and oriented to person, place, and time.  Skin: Skin is warm and dry. Rash noted.       Red,  Pruritic rash noted to the face, arms bilaterally and torso.  Psychiatric: She has a normal mood and affect.          Assessment & Plan:  Assessment: Allergic reaction to medication, pruritus  Plan: Depo-Medrol 80 mg IM given x1. DC Bactrim. Continue azithromycin. Prednisone 20 mg 1 by mouth daily x5 days. Followup dermatology for further management of her scleroderma.

## 2012-01-08 NOTE — Patient Instructions (Signed)
Drug Allergy Allergic reactions to medicines are common. Some allergic reactions are mild. A delayed type of drug allergy that occurs 1 week or more after exposure to a medicine or vaccine is called serum sickness. A life-threatening, sudden (acute) allergic reaction that involves the whole body is called anaphylaxis. CAUSES  "True" drug allergies occur when there is an allergic reaction to a medicine. This is caused by overactivity of the immune system. First, the body becomes sensitized. The immune system is triggered by your first exposure to the medicine. Following this first exposure, future exposure to the same medicine may be life-threatening. Almost any medicine can cause an allergic reaction. Common ones are:  Penicillin.   Sulfonamides (sulfa drugs).   Local anesthetics.   X-ray dyes that contain iodine.  SYMPTOMS  Common symptoms of a minor allergic reaction are:  Swelling around the mouth.   An itchy red rash or hives.   Vomiting or diarrhea.  Anaphylaxis can cause swelling of the mouth and throat. This makes it difficult to breathe and swallow. Severe reactions can be fatal within seconds, even after exposure to only a trace amount of the drug that causes the reaction. HOME CARE INSTRUCTIONS   If you are unsure of what caused your reaction, keep a diary of foods and medicines used. Include the symptoms that followed. Avoid anything that causes reactions.   You may want to follow up with an allergy specialist after the reaction has cleared in order to be tested to confirm the allergy. It is important to confirm that your reaction is an allergy, not just a side effect to the medicine. If you have a true allergy to a medicine, this may prevent that medicine and related medicines from being given to you when you are very ill.   If you have hives or a rash:   Take medicines as directed by your caregiver.   You may use an over-the-counter antihistamine (diphenhydramine) as  needed.   Apply cold compresses to the skin or take baths in cool water. Avoid hot baths or showers.   If you are severely allergic:   Continuous observation after a severe reaction may be needed. Hospitalization is often required.   Wear a medical alert bracelet or necklace stating your allergy.   You and your family must learn how to use an anaphylaxis kit or give an epinephrine injection to temporarily treat an emergency allergic reaction. If you have had a severe reaction, always carry your epinephrine injection or anaphylaxis kit with you. This can be lifesaving if you have a severe reaction.   Do not drive or perform tasks after treatment until the medicines used to treat your reaction have worn off, or until your caregiver says it is okay.  SEEK MEDICAL CARE IF:   You think you had an allergic reaction. Symptoms usually start within 30 minutes after exposure.   Symptoms are getting worse rather than better.   You develop new symptoms.   The symptoms that brought you to your caregiver return.  SEEK IMMEDIATE MEDICAL CARE IF:   You have swelling of the mouth, difficulty breathing, or wheezing.   You have a tight feeling in your chest or throat.   You develop hives, swelling, or itching all over your body.   You develop severe vomiting or diarrhea.   You feel faint or pass out.  This is an emergency. Use your epinephrine injection or anaphylaxis kit as you have been instructed. Call for emergency medical help. Even  if you improve after the injection, you need to be examined at a hospital emergency department. MAKE SURE YOU:   Understand these instructions.   Will watch your condition.   Will get help right away if you are not doing well or get worse.  Document Released: 12/17/2005 Document Revised: 08/29/2011 Document Reviewed: 05/23/2011 [name] Patient Information 2012 [place].

## 2012-01-11 ENCOUNTER — Telehealth: Payer: Self-pay | Admitting: *Deleted

## 2012-01-11 NOTE — Telephone Encounter (Addendum)
Pt saw Padonda this week, and was given Prednisone.  She feels it is making her feel worse and wants to discontinue it.  Please advise.  Rash is gone, but all she does is sleep.

## 2012-01-11 NOTE — Telephone Encounter (Signed)
Okay to discontinue prednisone

## 2012-01-11 NOTE — Telephone Encounter (Signed)
Notified pt. 

## 2012-02-07 ENCOUNTER — Encounter: Payer: Self-pay | Admitting: Internal Medicine

## 2012-02-07 ENCOUNTER — Ambulatory Visit (INDEPENDENT_AMBULATORY_CARE_PROVIDER_SITE_OTHER): Payer: BC Managed Care – PPO | Admitting: Internal Medicine

## 2012-02-07 VITALS — BP 138/80 | HR 76 | Temp 98.3°F | Resp 16 | Ht 68.0 in | Wt 152.0 lb

## 2012-02-07 DIAGNOSIS — G589 Mononeuropathy, unspecified: Secondary | ICD-10-CM

## 2012-02-07 DIAGNOSIS — T887XXA Unspecified adverse effect of drug or medicament, initial encounter: Secondary | ICD-10-CM

## 2012-02-07 DIAGNOSIS — Z23 Encounter for immunization: Secondary | ICD-10-CM

## 2012-02-07 DIAGNOSIS — Z0000 Encounter for general adult medical examination without abnormal findings: Secondary | ICD-10-CM

## 2012-02-07 DIAGNOSIS — G629 Polyneuropathy, unspecified: Secondary | ICD-10-CM

## 2012-02-07 DIAGNOSIS — M349 Systemic sclerosis, unspecified: Secondary | ICD-10-CM

## 2012-02-07 MED ORDER — GABAPENTIN 300 MG PO CAPS: 300.0000 mg | ORAL_CAPSULE | Freq: Three times a day (TID) | ORAL | Status: DC

## 2012-02-07 MED ORDER — DIAZEPAM 10 MG PO TABS: 10.0000 mg | ORAL_TABLET | Freq: Every evening | ORAL | Status: AC | PRN

## 2012-02-07 NOTE — Progress Notes (Signed)
Subjective:    [name], female    DOB: Jun 28, 1951, 62 y.o.   MRN: [id number]  HPI The patient had ulcers on the leg was seen by dermatology she was placed on Silvadene cream and sulfa trimethoprim and oral antibiotic.  She developed a diffuse burning rash with sulfa antibiotic was seen by our advanced clinical practitioner recognized as a drug allergy and the severity of which mandated a prednisone course.  The sulfa drug was withdrawn the prednisone course resulted in the resolution of the skin reaction before it developed into a more serious syndrome.  The patient is continuing to apply the Silvadene cream to the legs with slow but steady progress.   Review of Systems  Constitutional: Negative for activity change, appetite change and fatigue.  HENT: Negative for ear pain, congestion, neck pain, postnasal drip and sinus pressure.   Eyes: Negative for redness and visual disturbance.  Respiratory: Negative for cough, shortness of breath and wheezing.   Gastrointestinal: Negative for abdominal pain and abdominal distention.  Genitourinary: Negative for dysuria, frequency and menstrual problem.  Musculoskeletal: Negative for myalgias, joint swelling and arthralgias.  Skin: Negative for rash and wound.  Neurological: Negative for dizziness, weakness and headaches.  Hematological: Negative for adenopathy. Does not bruise/bleed easily.  Psychiatric/Behavioral: Negative for sleep disturbance and decreased concentration.   The patient is instructed to continue all medications as prescribed. Schedule followup with check out clerk upon leaving the clinic Past Medical History  Diagnosis Date  . Cancer   . History of chicken pox   . Hypertension   . Sclerodermia generalized   . Non Hodgkin's lymphoma   . Melanoma 1999    History   Social History  . Marital Status: Married    Spouse Name: N/A    Number of Children: N/A  . Years of Education: N/A   Occupational  History  . Not on file.   Social History Main Topics  . Smoking status: Never Smoker   . Smokeless tobacco: Not on file  . Alcohol Use: Yes     couple of times a week  . Drug Use: No  . Sexually Active: Not Currently   Other Topics Concern  . Not on file   Social History Narrative  . No narrative on file    Past Surgical History  Procedure Date  . Rt little toe removal 10/1998  . Stent left thigh 3/11  . Tonsillectomy and adenoidectomy   . Small intestine surgery     Family History  Problem Relation Age of Onset  . Lupus Father   . Lymphoma Father   . Hyperlipidemia Mother     Allergies  Allergen Reactions  . Sulfa Antibiotics     rash  . Penicillins Rash    Current Outpatient Prescriptions on File Prior to Visit  Medication Sig Dispense Refill  . amitriptyline (ELAVIL) 25 MG tablet Take 1 tablet (25 mg total) by mouth at bedtime.  30 tablet  11  . aspirin 81 MG tablet Take 81 mg by mouth daily.        Marland Kitchen atorvastatin (LIPITOR) 40 MG tablet Take 40 mg by mouth daily.        Marland Kitchen azelastine (ASTELIN) 137 MCG/SPRAY nasal spray Place 1 spray into the nose daily. Use in each nostril as directed      . Cholecalciferol (VITAMIN D) 2000 UNITS CAPS Take by mouth daily.        . citalopram (CELEXA) 20 MG tablet  Take 1 tablet (20 mg total) by mouth daily.  30 tablet  11  . clopidogrel (PLAVIX) 75 MG tablet Take 75 mg by mouth daily.        . furosemide (LASIX) 20 MG tablet Take 20 mg by mouth 2 (two) times daily.        Marland Kitchen gabapentin (NEURONTIN) 300 MG capsule Take 1 tablet by mouth 2 (two) times daily.      . mometasone (NASONEX) 50 MCG/ACT nasal spray Place 2 sprays into the nose daily.        . multivitamin (THERAGRAN) per tablet Take 1 tablet by mouth daily.        . mupirocin ointment (BACTROBAN) 2 % Take 1 application by mouth 2 (two) times daily.      Marland Kitchen NEXIUM 40 MG capsule Take 1 tablet by mouth Daily.      . OMEGA 3 1000 MG CAPS Take 3 capsules by mouth daily.          Marland Kitchen spironolactone (ALDACTONE) 25 MG tablet Take 25 mg by mouth daily.        . vitamin B-12 (CYANOCOBALAMIN) 500 MCG tablet Take 500 mcg by mouth daily.          BP 138/80  Pulse 76  Temp 98.3 F (36.8 C)  Resp 16  Ht _0  (1.727 m)  Wt 152 lb (68.947 kg)  BMI 23.11 kg/m2         Objective:   Physical Exam  Nursing note and vitals reviewed. Constitutional: She is oriented to person, place, and time. She appears well-developed and well-nourished. No distress.  HENT:  Head: Normocephalic and atraumatic.  Right Ear: External ear normal.  Left Ear: External ear normal.  Nose: Nose normal.  Mouth/Throat: Oropharynx is clear and moist.  Eyes: Conjunctivae and EOM are normal. Pupils are equal, round, and reactive to light.  Neck: Normal range of motion. Neck supple. No JVD present. No tracheal deviation present. No thyromegaly present.  Cardiovascular: Normal rate, regular rhythm, normal heart sounds and intact distal pulses.   No murmur heard. Pulmonary/Chest: Effort normal and breath sounds normal. She has no wheezes. She exhibits no tenderness.  Abdominal: Soft. Bowel sounds are normal.  Musculoskeletal: Normal range of motion. She exhibits no edema and no tenderness.  Lymphadenopathy:    She has no cervical adenopathy.  Neurological: She is alert and oriented to person, place, and time. She has normal reflexes. No cranial nerve deficit.  Skin: Skin is warm and dry. She is not diaphoretic.  Psychiatric: She has a normal mood and affect. Her behavior is normal.          Assessment & Plan:  Do to her scleroderma she will require an EGD and this time for colonoscopy.  We have referred her to gastroenterology to have this done.  The patient passed worsening peripheral neuropathy and it's keeping her awake at night this is partially neuropathic and partially vascular we will increase the Neurontin to 600 mg at night and give her diazepam for rest.  She will require a  zostervax We will continue to monitor her with her rheumatologist.

## 2012-02-07 NOTE — Patient Instructions (Signed)
The patient is instructed to continue all medications as prescribed. Schedule followup with check out clerk upon leaving the clinic

## 2012-02-07 NOTE — Progress Notes (Signed)
Addended by: Allyne Gee on: 02/07/2012 12:11 PM   Modules accepted: Orders

## 2012-02-28 ENCOUNTER — Telehealth: Payer: Self-pay | Admitting: Internal Medicine

## 2012-02-28 DIAGNOSIS — Z889 Allergy status to unspecified drugs, medicaments and biological substances status: Secondary | ICD-10-CM

## 2012-02-28 MED ORDER — MOMETASONE FUROATE 50 MCG/ACT NA SUSP: 2.0000 | Freq: Every day | NASAL | Status: DC

## 2012-02-28 MED ORDER — AZELASTINE HCL 0.1 % NA SOLN: 1.0000 | Freq: Every day | NASAL | Status: DC

## 2012-02-28 NOTE — Telephone Encounter (Signed)
Patient called stating that she needs refills on her nasonex 50 mcg and astlin 137 mcg called into [facility]. Please assist.

## 2012-03-14 ENCOUNTER — Other Ambulatory Visit: Payer: Self-pay | Admitting: *Deleted

## 2012-03-14 ENCOUNTER — Other Ambulatory Visit: Payer: Self-pay | Admitting: Internal Medicine

## 2012-03-14 DIAGNOSIS — I272 Pulmonary hypertension, unspecified: Secondary | ICD-10-CM

## 2012-03-14 DIAGNOSIS — E785 Hyperlipidemia, unspecified: Secondary | ICD-10-CM

## 2012-03-14 DIAGNOSIS — D869 Sarcoidosis, unspecified: Secondary | ICD-10-CM

## 2012-03-14 MED ORDER — ATORVASTATIN CALCIUM 40 MG PO TABS: 40.0000 mg | ORAL_TABLET | Freq: Every day | ORAL | Status: DC

## 2012-03-14 NOTE — Telephone Encounter (Signed)
Pt needs new rx generic lipitor 39m call into [facility] [address] [phone] Pt will be bringing a copy of abnormal echocardiogram for dr jArnoldo Moral[place]

## 2012-03-18 ENCOUNTER — Encounter: Payer: Self-pay | Admitting: Internal Medicine

## 2012-03-24 ENCOUNTER — Encounter (HOSPITAL_BASED_OUTPATIENT_CLINIC_OR_DEPARTMENT_OTHER): Payer: BC Managed Care – PPO | Attending: Internal Medicine

## 2012-03-24 DIAGNOSIS — Z79899 Other long term (current) drug therapy: Secondary | ICD-10-CM | POA: Insufficient documentation

## 2012-03-24 DIAGNOSIS — Z85820 Personal history of malignant melanoma of skin: Secondary | ICD-10-CM | POA: Insufficient documentation

## 2012-03-24 DIAGNOSIS — M349 Systemic sclerosis, unspecified: Secondary | ICD-10-CM | POA: Insufficient documentation

## 2012-03-24 DIAGNOSIS — I7300 Raynaud's syndrome without gangrene: Secondary | ICD-10-CM | POA: Insufficient documentation

## 2012-03-24 DIAGNOSIS — Z9221 Personal history of antineoplastic chemotherapy: Secondary | ICD-10-CM | POA: Insufficient documentation

## 2012-03-24 DIAGNOSIS — G5790 Unspecified mononeuropathy of unspecified lower limb: Secondary | ICD-10-CM | POA: Insufficient documentation

## 2012-03-24 DIAGNOSIS — I2789 Other specified pulmonary heart diseases: Secondary | ICD-10-CM | POA: Insufficient documentation

## 2012-03-24 DIAGNOSIS — Z7982 Long term (current) use of aspirin: Secondary | ICD-10-CM | POA: Insufficient documentation

## 2012-03-24 DIAGNOSIS — S98139A Complete traumatic amputation of one unspecified lesser toe, initial encounter: Secondary | ICD-10-CM | POA: Insufficient documentation

## 2012-03-24 DIAGNOSIS — I10 Essential (primary) hypertension: Secondary | ICD-10-CM | POA: Insufficient documentation

## 2012-03-24 DIAGNOSIS — Z87898 Personal history of other specified conditions: Secondary | ICD-10-CM | POA: Insufficient documentation

## 2012-03-24 DIAGNOSIS — L97809 Non-pressure chronic ulcer of other part of unspecified lower leg with unspecified severity: Secondary | ICD-10-CM | POA: Insufficient documentation

## 2012-03-25 ENCOUNTER — Ambulatory Visit (INDEPENDENT_AMBULATORY_CARE_PROVIDER_SITE_OTHER)
Admission: RE | Admit: 2012-03-25 | Discharge: 2012-03-25 | Disposition: A | Discharge status: Home / Self Care | Payer: BC Managed Care – PPO | Source: Ambulatory Visit | Attending: Pulmonary Disease | Admitting: Pulmonary Disease

## 2012-03-25 ENCOUNTER — Encounter: Payer: Self-pay | Admitting: Pulmonary Disease

## 2012-03-25 ENCOUNTER — Ambulatory Visit (INDEPENDENT_AMBULATORY_CARE_PROVIDER_SITE_OTHER): Payer: BC Managed Care – PPO | Admitting: Pulmonary Disease

## 2012-03-25 VITALS — BP 110/68 | HR 80 | Temp 97.4°F | Ht 68.0 in | Wt 157.6 lb

## 2012-03-25 DIAGNOSIS — R0600 Dyspnea, unspecified: Secondary | ICD-10-CM

## 2012-03-25 DIAGNOSIS — I2729 Other secondary pulmonary hypertension: Secondary | ICD-10-CM

## 2012-03-25 DIAGNOSIS — R0989 Other specified symptoms and signs involving the circulatory and respiratory systems: Secondary | ICD-10-CM

## 2012-03-25 DIAGNOSIS — R69 Illness, unspecified: Secondary | ICD-10-CM

## 2012-03-25 DIAGNOSIS — R0609 Other forms of dyspnea: Secondary | ICD-10-CM

## 2012-03-25 DIAGNOSIS — I2789 Other specified pulmonary heart diseases: Secondary | ICD-10-CM

## 2012-03-25 NOTE — Progress Notes (Signed)
[facility]  NAME:  [name] [name], [name] [name]                ACCOUNT NO.:  [id number]  MEDICAL RECORD NO.:  [id number]      DATE OF BIRTH:  1951/09/29  PHYSICIAN:  Orlando Penner. Seretha Estabrooks, M.D.  VISIT DATE:  03/24/2012                                  OFFICE VISIT   HISTORY:  This pleasant 61 year old white female is seen for evaluation of multiple ulcerations on her lower extremities.  The patient's history is extremely complex, is centered primarily around scleroderma (incidentally, this has been the dermatologist's assessment that these are vascular ulcers in association with her scleroderma, although I have no documentation that a biopsy is ever been done).  She has also had non-Hodgkin's lymphoma of the brain and has required chemotherapy for series of 8 treatments in 2010 for that problem.  She has had a melanoma removed, which is thought have been cured.  She does have hypertension and also known pulmonary hypertension, recently diagnosed.  She has had basal cell carcinomas in numerous areas, which have been resected.  She has Raynaud syndrome in connection with her scleroderma, and she has lower extremity neuropathy.  Her past operative procedures have included tonsillectomy in the remote past, amputation of the right fifth toe secondary to osteomyelitis a number of years ago, biopsy of her brain tumor in 2010, and right and left femoral stents placed by physician in [place] several years ago.  She has been treated with all routine measures at dermatology office elsewhere and has had trials of Oasis, and Apligraf with failure to heal these ulcers.  She is referred now and hopes that we can through the use of some more recently available techniques to provide healing for these ulcerations.  PAST MEDICAL HISTORY:  Operations are as indicated above. Hospitalizations have been in association with these problems.  She has allergies to PENICILLIN, which causes hives and  SULFA, which is said to cause edema and a rash.  Her regular medications include: 1. Baby aspirin 1 daily. 2. Vitamin D 1000 units daily. 3. Fish oil 2 g daily. 4. Multiple vitamin 1 daily. 5. Lipitor 20 mg daily. 6. Biotin 2500 mg daily. 7. Celexa 20 mg daily. 8. Plavix 75 mg daily. 9. Nexium 40 mg daily. 10.Lasix 20 mg b.i.d. 11.Neurontin 800 mg b.i.d. 12.Aldactone 75 mg daily. 13.She has recently been started on doxycycline per the dermatologist     100 mg b.i.d. for 30 days.  This was started on March 20, 2-13.  PERSONAL HISTORY:  The patient is married, lives with her husband, who has been very supportive throughout these various illnesses.  She is college educated, is currently unemployed and has been for a number of years.  She apparently does not smoke or use alcoholic beverages at this time.  FAMILY HISTORY:  Not reviewed.  The patient says that she is essentially stable with respect all over problems except for these skin lesions and also that she is as yet under no specific treatment for recently discovered pulmonary hypertension.  PHYSICAL EXAMINATION:  VITAL SIGNS:  Blood pressure is 120/85, pulse is 87 and regular, respirations 18 AND unlabored, temperature 98.3.  She is 5 feet 8 inches tall, weighs 150 pounds. GENERAL:  She is alert, cooperative, in no immediate distress. SKIN:  Warm and  dry, shows distal mottling in all extremities, consistent with her scleroderma and Raynaud's phenomena. HEENT:  She is slightly pallid.  Her mucous membranes are moist. CHEST:  Grossly clear to auscultation. HEART:  Tones are of good quality.  There is a definite fourth heart sound heard at the sternal border, which might well be a right ventricular gallop in view of her known pulmonary hypertension. ABDOMEN:  Nontender. EXTREMITIES:  Free of edema.  The mottling changes as described above are in a stocking-glove distribution in both upper and lower extremities.  She has on  the tip of the index finger of her right hand a small ulcer, which is bandaged and which she says she can manage on her own, and if these occur frequently and that they usually resolve themselves with her on limited treatment. On the other hand, on the lower extremities, she has multiple lesions to include one in the right ankle measuring 1.3 x 3.3 cm approximately 0.2 cm in depth, with an adherent fibrotic base. On the left ankle, she has another 2.9 x 3.4 x 0.5 cm in depth that has an extremely semi-necrotic base, and this is just distal and slightly posterior to the lateral malleolus.  On her right anterior ankle, she has another lesion measuring 1.4 x 1.1 x 0.1 cm that has a more yellowish semi-fibrotic base.  On her left ankle laterally, she has fourth lesion measuring 2.4 x 1.8 x 0.3 cm again with a fairly fibrous base.  Pulses are not palpable in her feet bilaterally.  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  DISPOSITION:  Effort is made to the extent that she can tolerate to debride these wounds of as much of the slough as previously described as possible.  This precedes a reasonably well, although on the anterior lower extremity lesion on the right, which is about the mid pretibial in location.  I very quickly had a small vein, which spewed blood at pretty good pressure presumably because she lacks the ability to contract these veins.  This was stopped with extended pressure and eventually a silver nitrate cauterization.  The lesion on the left medial heel really rather than ankle was cautiously debrided because of its proximity to the posterior tibial artery.  I was able to get out most of the semi-necrotic material, but was very cautious and had to leave some behind.  The debridement of the other lesions went reasonably well, although  none of them was I able to get perfectly clean.  Following this, the lesions were all treated with applications of Santyl mixed with Hydrogel, and were dressed with Tielle dressings.  I certainly do not think that any form of compressive dressing can or should be used safely in this situation.  The patient was given a prescription for Santyl and instructions about how to change these dressings at home on every-other-day basis.  Her followup visit here will be in 1 week.          ______________________________ Orlando Penner. London Pepper, M.D.     RES/MEDQ  D:  03/24/2012  T:  03/25/2012  Job:  [id number]

## 2012-03-25 NOTE — Assessment & Plan Note (Addendum)
The patient has a history of scleroderma, as well as documented pulmonary hypertension in the past at right heart catheterization per the patient.  She has been treated with Tracleer and revatio in the past, but this was apparently discontinued when followup echoes appeared normal??  She has not had a right heart catheterization since 2007, and her echocardiogram this year shows an estimated right ventricular systolic pressure of 643- 220 mmHg.  She was also noted to have severe tricuspid regurgitation.  At this point, I think she needs to have a right heart catheterization, as long as cardiology feels it is safe given her pulmonary pressures.  If she truly does have very severe pulmonary hypertension, she would be best served with a referral to [facility] for consideration of flolan.

## 2012-03-25 NOTE — Patient Instructions (Signed)
Agree with referral to rheumatology and GI by Dr. Arnoldo Morale Will set up for full breathing studies, and do baseline cxr today.  Will call you with the results. Will schedule appt with Dr. [name] to consider right heart cath that I think you need to establish severity of your pulmonary hypertension. Once all of the above is done, will see you again to discuss treatment.

## 2012-03-25 NOTE — Progress Notes (Signed)
Subjective:    [name], female    DOB: 10-02-1951, 61 y.o.   MRN: [id number]  HPI The patient is a 61 year old female who I've been asked to see for pulmonary hypertension.  She was diagnosed with scleroderma in 1968, and subsequently diagnosed with pulmonary hypertension in December of 2007.  She did have a right heart catheterization by her history for verification.  The patient has been treated with tracleer and revatio for approximately one to 2 years, and then tells me the medications were stopped because the echo "looked good".  I do not have her old records available for review.  Apparently she was followed very closely with serial echocardiograms that remained fairly normal, and therefore she was left off medications.  She had a recent echo last month in [place] that showed changes consistent with severe pulmonary hypertension with a dysfunctional right ventricle.  The patient states that she has never been told scleroderma is affecting her lungs.  She does have some reflux symptoms but no definite regurgitation.  She does have a history of aspiration pneumonia.  She has been followed by a rheumatologist in the past treated with CellCept, but is unsure why this medication was stopped.  She has been referred to a rheumatologist at [facility].  The patient has not had any recent chest x-rays or pulmonary function studies, and her last right heart catheter was in 2007.  Currently, the patient has significant dyspnea on exertion.  She has been in active for 3 years, and also comments that neuropathy limits her mobility more than her breathing.  She will get winded bringing groceries in from the car, and also walking up one flight of stairs.  She has a 1-2 block dyspnea on exertion at a moderate pace on flat ground.  She states that cough is usually not an issue for her, but she has had URI symptoms this past week.  She states that  lower extremity edema is not usually an issue for  her.  The patient also has a history of non-Hodgkin's lymphoma, and was felt to be cured after chemotherapy in 2010.   Review of Systems  Constitutional: Negative for fever and unexpected weight change.  HENT: Negative for ear pain, nosebleeds, congestion, sore throat, rhinorrhea, sneezing, trouble swallowing, dental problem, postnasal drip and sinus pressure.   Eyes: Negative for redness and itching.  Respiratory: Positive for cough and shortness of breath. Negative for chest tightness and wheezing.   Cardiovascular: Negative for palpitations and leg swelling.  Gastrointestinal: Negative for nausea and vomiting.  Genitourinary: Negative for dysuria.  Musculoskeletal: Negative for joint swelling.  Skin: Negative for rash.  Neurological: Negative for headaches.  Hematological: Does not bruise/bleed easily.  Psychiatric/Behavioral: Negative for dysphoric mood. The patient is not nervous/anxious.        Objective:   Physical Exam Constitutional:  Well developed, no acute distress  HENT:  Nares patent without discharge, mild crusting noted  Oropharynx without exudate, palate and uvula are normal  Eyes:  Perrla, eomi, no scleral icterus  Neck:  No JVD, no TMG  Cardiovascular:  Normal rate, regular rhythm, no rubs or gallops.  No murmurs.  Prominent P2        Intact distal pulses  Pulmonary :  Normal breath sounds, no stridor or respiratory distress   No rales, rhonchi, or wheezing  Abdominal:  Soft, nondistended, bowel sounds present.  No tenderness noted.   Musculoskeletal:  mild lower extremity edema noted.  Lymph  Nodes:  No cervical lymphadenopathy noted  Skin:  No cyanosis noted, but ++skin changes in extremities compatible with her scleroderma  Neurologic:  Alert, appropriate, moves all 4 extremities without obvious deficit.         Assessment & Plan:

## 2012-03-25 NOTE — Assessment & Plan Note (Signed)
The patient has significant dyspnea on exertion that I suspect is related to her severe pulmonary hypertension, but has not had a pulmonary workup in many years.  We need to make sure she does not have interstitial lung disease related to her scleroderma or chronic occult aspiration.  The patient feels that her neuropathy also limits her exertional tolerance as well.  Will schedule for chest x-ray and pulmonary function studies in the near future.

## 2012-03-26 ENCOUNTER — Other Ambulatory Visit: Payer: Self-pay | Admitting: Pulmonary Disease

## 2012-03-26 DIAGNOSIS — R0600 Dyspnea, unspecified: Secondary | ICD-10-CM

## 2012-03-26 MED ORDER — LEVOFLOXACIN 750 MG PO TABS: 750.0000 mg | ORAL_TABLET | Freq: Every day | ORAL | Status: AC

## 2012-03-28 ENCOUNTER — Encounter (HOSPITAL_COMMUNITY): Payer: BC Managed Care – PPO

## 2012-03-31 ENCOUNTER — Encounter (HOSPITAL_COMMUNITY): Payer: Self-pay | Admitting: Emergency Medicine

## 2012-03-31 ENCOUNTER — Encounter (HOSPITAL_BASED_OUTPATIENT_CLINIC_OR_DEPARTMENT_OTHER): Payer: BC Managed Care – PPO | Attending: Internal Medicine

## 2012-03-31 ENCOUNTER — Emergency Department (HOSPITAL_COMMUNITY)
Admission: EM | Admit: 2012-03-31 | Discharge: 2012-03-31 | Disposition: A | Discharge status: Home / Self Care | Payer: BC Managed Care – PPO | Attending: Emergency Medicine | Admitting: Emergency Medicine

## 2012-03-31 DIAGNOSIS — IMO0002 Reserved for concepts with insufficient information to code with codable children: Secondary | ICD-10-CM | POA: Insufficient documentation

## 2012-03-31 DIAGNOSIS — M349 Systemic sclerosis, unspecified: Secondary | ICD-10-CM | POA: Insufficient documentation

## 2012-03-31 DIAGNOSIS — I872 Venous insufficiency (chronic) (peripheral): Secondary | ICD-10-CM | POA: Insufficient documentation

## 2012-03-31 DIAGNOSIS — T148XXA Other injury of unspecified body region, initial encounter: Secondary | ICD-10-CM

## 2012-03-31 DIAGNOSIS — Z880 Allergy status to penicillin: Secondary | ICD-10-CM | POA: Insufficient documentation

## 2012-03-31 DIAGNOSIS — S81009A Unspecified open wound, unspecified knee, initial encounter: Secondary | ICD-10-CM | POA: Insufficient documentation

## 2012-03-31 DIAGNOSIS — Z87898 Personal history of other specified conditions: Secondary | ICD-10-CM | POA: Insufficient documentation

## 2012-03-31 DIAGNOSIS — X58XXXA Exposure to other specified factors, initial encounter: Secondary | ICD-10-CM | POA: Insufficient documentation

## 2012-03-31 DIAGNOSIS — Z882 Allergy status to sulfonamides status: Secondary | ICD-10-CM | POA: Insufficient documentation

## 2012-03-31 DIAGNOSIS — L97809 Non-pressure chronic ulcer of other part of unspecified lower leg with unspecified severity: Secondary | ICD-10-CM | POA: Insufficient documentation

## 2012-03-31 DIAGNOSIS — Z79899 Other long term (current) drug therapy: Secondary | ICD-10-CM | POA: Insufficient documentation

## 2012-03-31 DIAGNOSIS — Y849 Medical procedure, unspecified as the cause of abnormal reaction of the patient, or of later complication, without mention of misadventure at the time of the procedure: Secondary | ICD-10-CM | POA: Insufficient documentation

## 2012-03-31 NOTE — ED Provider Notes (Signed)
History     CSN: [id number]  Arrival date & time 03/31/12  1648   First MD Initiated Contact with Patient 03/31/12 1655      Chief Complaint  Patient presents with  . bleeding from debridement in lower leg     HPI Patient presented to the emergency room today for bleeding from a wound. She has a history of scleroderma and has had recurrent chronic issues with poor peripheral vascular perfusion and wounds on her extremities. Patient went to the wound care center today to have a debridement of a wound on her right lower leg. Patient states she had the treatment and they noted some bleeding and initially felt like the bleeding had stopped with pressure.   As the patient was walking out the door, she noted a fair amount of blood oozing from the wound and it had soaked her shoe. Patient had a pressure dressing applied and she was sent to the emergency room. She denies any numbness or weakness or any other new acute complaints. Past Medical History  Diagnosis Date  . History of chicken pox   . Hypertension   . Sclerodermia generalized   . Non Hodgkin's lymphoma     Treated with chemo 2010, felt to be cured.  . Melanoma 1999    Past Surgical History  Procedure Date  . Rt little toe removal 10/1998  . Stent left thigh 3/11  . Tonsillectomy and adenoidectomy   . Small intestine surgery   . Biopsy on cerebellum     Family History  Problem Relation Age of Onset  . Lupus Father   . Lymphoma Father   . Hyperlipidemia Mother   . Cancer Daughter     sarcoma    History  Substance Use Topics  . Smoking status: Never Smoker   . Smokeless tobacco: Not on file  . Alcohol Use: Yes     couple of times a week    OB History    Grav Para Term Preterm Abortions TAB SAB Ect Mult Living                  Review of Systems  All other systems reviewed and are negative.    Allergies  Sulfa antibiotics and Penicillins  Home Medications   Current Outpatient Rx  Name Route Sig  Dispense Refill  . AMITRIPTYLINE HCL 25 MG PO TABS Oral Take 25 mg by mouth at bedtime.    . ASPIRIN 81 MG PO TABS Oral Take 81 mg by mouth daily.      . ATORVASTATIN CALCIUM 40 MG PO TABS Oral Take 1 tablet (40 mg total) by mouth daily. 30 tablet 11  . AZELASTINE HCL 137 MCG/SPRAY NA SOLN Nasal Place 1 spray into the nose daily. Use in each nostril as directed 30 mL 6  . VITAMIN D 2000 UNITS PO CAPS Oral Take by mouth daily.      Marland Kitchen CITALOPRAM HYDROBROMIDE 20 MG PO TABS Oral Take 1 tablet (20 mg total) by mouth daily. 30 tablet 11  . CLOPIDOGREL BISULFATE 75 MG PO TABS Oral Take 75 mg by mouth daily.      . COLLAGENASE 250 UNIT/GM EX OINT Topical Apply 1 application topically 2 (two) times daily. To both legs.    Marland Kitchen DOXYCYCLINE HYCLATE 100 MG PO TABS Oral Take 100 mg by mouth 2 (two) times daily.    . FUROSEMIDE 20 MG PO TABS Oral Take 20 mg by mouth 2 (two) times daily.      Marland Kitchen  GABAPENTIN 300 MG PO CAPS Oral Take 1 capsule (300 mg total) by mouth 3 (three) times daily. 300 mg in the a.m. and 600 mg at night 90 capsule 11  . LEVOFLOXACIN 750 MG PO TABS Oral Take 1 tablet (750 mg total) by mouth daily. 7 tablet 0  . MOMETASONE FUROATE 50 MCG/ACT NA SUSP Nasal Place 2 sprays into the nose daily. 17 g 6  . MULTIVITAMINS PO TABS Oral Take 1 tablet by mouth daily.      Marland Kitchen NEXIUM 40 MG PO CPDR Oral Take 1 tablet by mouth Daily.    . OMEGA 3 1000 MG PO CAPS Oral Take 3 capsules by mouth daily.     Marland Kitchen SPIRONOLACTONE 25 MG PO TABS Oral Take 25 mg by mouth daily.     Marland Kitchen VITAMIN B-12 500 MCG PO TABS Oral Take 500 mcg by mouth daily.        BP 120/78  Pulse 80  Temp 97.2 F (36.2 C)  SpO2 98%  Physical Exam  Nursing note and vitals reviewed. Constitutional: She appears well-developed and well-nourished. No distress.  HENT:  Head: Normocephalic and atraumatic.  Right Ear: External ear normal.  Left Ear: External ear normal.  Eyes: Conjunctivae are normal. Right eye exhibits no discharge. Left eye  exhibits no discharge. No scleral icterus.  Neck: Neck supple. No tracheal deviation present.  Cardiovascular: Normal rate and regular rhythm.   Pulmonary/Chest: Effort normal and breath sounds normal. No stridor. No respiratory distress. She has no wheezes.  Musculoskeletal: She exhibits no edema.       Poor perfusion distal extremities in the fingers and toes, approximately 1-2 cm ulcerative type lesion anterior shin on the right leg with a bandage in place  Neurological: She is alert. Cranial nerve deficit: no gross deficits.  Skin: Skin is warm and dry.       Shiny tense skin consistent with scleroderma  Psychiatric: She has a normal mood and affect.    ED Course  Procedures (including critical care time)  Labs Reviewed - No data to display No results found.   1. Bleeding from wound       MDM  The patient's pressure dressing was removed. At this time there is no active bleeding at the site. There is a small dressing with dried blood overlying the wound.  Will monitor the patient in the ED at this time appears like the bleeding has been controlled. Her own doctors coming to see her here in the next few minutes.  Pt monitored in the ED.  No further bleeding.  Pt was seen by her wound care doctor in the ED.      Kathalene Frames, MD 04/01/12 [phone]

## 2012-03-31 NOTE — ED Notes (Signed)
NHA:FB90<XY> Expected date:<BR> Expected time:<BR> Means of arrival:<BR> Comments:<BR>

## 2012-03-31 NOTE — ED Notes (Signed)
When bandage removed from lower leg, no bleeding present. Dr London Pepper at bedside

## 2012-03-31 NOTE — Discharge Instructions (Signed)
Wound Care Wound care helps prevent pain and infection.  You may need a tetanus shot if:  You cannot remember when you had your last tetanus shot.   You have never had a tetanus shot.   The injury broke your skin.  If you need a tetanus shot and you choose not to have one, you may get tetanus. Sickness from tetanus can be serious. HOME CARE   Only take medicine as told by your doctor.   Clean the wound daily with mild soap and water.   Change any bandages (dressings) as told by your doctor.   Put medicated cream and a bandage on the wound as told by your doctor.   Change the bandage if it gets wet, dirty, or starts to smell.   Take showers. Do not take baths, swim, or do anything that puts your wound under water.   Rest and raise (elevate) the wound until the pain and puffiness (swelling) are better.   Keep all doctor visits as told.  GET HELP RIGHT AWAY IF:   Yellowish-white fluid (pus) comes from the wound.   Medicine does not lessen your pain.   There is a red streak going away from the wound.   You cannot move your finger or toe.   You have a fever.  MAKE SURE YOU:   Understand these instructions.   Will watch your condition.   Will get help right away if you are not doing well or get worse.  Document Released: 09/25/2008 Document Revised: 12/06/2011 Document Reviewed: 04/22/2011 [facility] Patient Information 2012 [place].

## 2012-03-31 NOTE — ED Notes (Signed)
Was at wound care center having debridement of lower right leg and started bleeding

## 2012-04-04 ENCOUNTER — Ambulatory Visit (HOSPITAL_COMMUNITY)
Admission: RE | Admit: 2012-04-04 | Discharge: 2012-04-04 | Disposition: A | Discharge status: Home / Self Care | Payer: BC Managed Care – PPO | Source: Ambulatory Visit | Attending: Pulmonary Disease | Admitting: Pulmonary Disease

## 2012-04-04 DIAGNOSIS — R0989 Other specified symptoms and signs involving the circulatory and respiratory systems: Secondary | ICD-10-CM | POA: Insufficient documentation

## 2012-04-04 DIAGNOSIS — R0600 Dyspnea, unspecified: Secondary | ICD-10-CM

## 2012-04-04 DIAGNOSIS — R0602 Shortness of breath: Secondary | ICD-10-CM | POA: Insufficient documentation

## 2012-04-04 DIAGNOSIS — R05 Cough: Secondary | ICD-10-CM | POA: Insufficient documentation

## 2012-04-04 DIAGNOSIS — R0609 Other forms of dyspnea: Secondary | ICD-10-CM | POA: Insufficient documentation

## 2012-04-04 DIAGNOSIS — J988 Other specified respiratory disorders: Secondary | ICD-10-CM | POA: Insufficient documentation

## 2012-04-04 DIAGNOSIS — R059 Cough, unspecified: Secondary | ICD-10-CM | POA: Insufficient documentation

## 2012-04-04 MED ORDER — ALBUTEROL SULFATE (5 MG/ML) 0.5% IN NEBU: 2.5000 mg | INHALATION_SOLUTION | Freq: Once | RESPIRATORY_TRACT | Status: DC

## 2012-04-08 ENCOUNTER — Encounter: Payer: Self-pay | Admitting: Internal Medicine

## 2012-04-08 ENCOUNTER — Ambulatory Visit (INDEPENDENT_AMBULATORY_CARE_PROVIDER_SITE_OTHER): Payer: BC Managed Care – PPO | Admitting: Internal Medicine

## 2012-04-08 VITALS — BP 120/80 | HR 84 | Temp 98.2°F | Resp 18 | Ht 68.0 in | Wt 149.0 lb

## 2012-04-08 DIAGNOSIS — L940 Localized scleroderma [morphea]: Secondary | ICD-10-CM

## 2012-04-08 DIAGNOSIS — I10 Essential (primary) hypertension: Secondary | ICD-10-CM

## 2012-04-08 DIAGNOSIS — L97509 Non-pressure chronic ulcer of other part of unspecified foot with unspecified severity: Secondary | ICD-10-CM | POA: Insufficient documentation

## 2012-04-08 DIAGNOSIS — I743 Embolism and thrombosis of arteries of the lower extremities: Secondary | ICD-10-CM

## 2012-04-08 DIAGNOSIS — I779 Disorder of arteries and arterioles, unspecified: Secondary | ICD-10-CM

## 2012-04-08 LAB — BASIC METABOLIC PANEL
BUN: 22 mg/dL (ref 6–23)
CO2: 27 mEq/L (ref 19–32)
Calcium: 9.1 mg/dL (ref 8.4–10.5)
Chloride: 102 mEq/L (ref 96–112)
Creatinine, Ser: 1 mg/dL (ref 0.4–1.2)
GFR: 57.61 mL/min — ABNORMAL LOW (ref >60.00)
Glucose, Bld: 66 mg/dL — ABNORMAL LOW (ref 70–99)
Potassium: 4.4 mEq/L (ref 3.5–5.1)
Sodium: 140 mEq/L (ref 135–145)

## 2012-04-08 LAB — CBC WITH DIFFERENTIAL/PLATELET
Basophils Absolute: 0.1 10*3/uL (ref 0.0–0.1)
Basophils Relative: 0.6 % (ref 0.0–3.0)
Eosinophils Absolute: 0 10*3/uL (ref 0.0–0.7)
Eosinophils Relative: 0.6 % (ref 0.0–5.0)
HCT: 45 % (ref 36.0–46.0)
Hemoglobin: 14.9 g/dL (ref 12.0–15.0)
Lymphocytes Relative: 11.6 % — ABNORMAL LOW (ref 12.0–46.0)
Lymphs Abs: 1 10*3/uL (ref 0.7–4.0)
MCHC: 33 g/dL (ref 30.0–36.0)
MCV: 102.1 fl — ABNORMAL HIGH (ref 78.0–100.0)
Monocytes Absolute: 0.8 10*3/uL (ref 0.1–1.0)
Monocytes Relative: 9.6 % (ref 3.0–12.0)
Neutro Abs: 6.6 10*3/uL (ref 1.4–7.7)
Neutrophils Relative %: 77.6 % — ABNORMAL HIGH (ref 43.0–77.0)
Platelets: 299 10*3/uL (ref 150.0–400.0)
RBC: 4.41 Mil/uL (ref 3.87–5.11)
RDW: 18 % — ABNORMAL HIGH (ref 11.5–14.6)
WBC: 8.5 10*3/uL (ref 4.5–10.5)

## 2012-04-08 NOTE — Progress Notes (Addendum)
Subjective:    Patient ID: CHESNEE FLOREN, female    DOB: March 25, 1951, 61 y.o.   MRN: [id number]  HPI as is a 61 year old female with a history of scleroderma hypertension hyperlipidemia who was seen in wound care clinic for progressive ulcerations of the legs and now she is wearing Unna boots bilaterally.  She had a debridement which hit a vein causing significant blood loss and CBC differential will be monitored today as well as an iron level to see if she desired to rebuild blood.  She also will be monitored with a base metabolic panel due to the development of pulmonary hypertension seen on her echocardiogram she was referred to cardiology for examination and consideration for heart catheterization to monitor pressures at which time she may be a candidate for either CellCept or Revadio    Review of Systems  Constitutional: Positive for activity change, appetite change and unexpected weight change.  HENT: Positive for hearing loss and congestion.   Respiratory: Positive for chest tightness and shortness of breath.   Genitourinary: Negative.   Musculoskeletal: Positive for myalgias, joint swelling and gait problem.  Neurological: Positive for weakness.       Objective:   Physical Exam  Nursing note and vitals reviewed. Constitutional: She appears well-developed and well-nourished. No distress.  HENT:  Head: Normocephalic and atraumatic.  Right Ear: External ear normal.  Left Ear: External ear normal.  Nose: Nose normal.  Mouth/Throat: Oropharynx is clear and moist.  Eyes: Conjunctivae and EOM are normal. Pupils are equal, round, and reactive to light.  Neck: Normal range of motion. Neck supple. No JVD present. No tracheal deviation present. No thyromegaly present.  Cardiovascular: Normal rate, regular rhythm, normal heart sounds and intact distal pulses.   No murmur heard. Pulmonary/Chest: Effort normal and breath sounds normal. She has no wheezes. She exhibits no tenderness.    Abdominal: Soft. Bowel sounds are normal.  Musculoskeletal: She exhibits no edema and no tenderness.  Lymphadenopathy:    She has no cervical adenopathy.  Neurological: She has normal reflexes. No cranial nerve deficit.  Skin: She is not diaphoretic.       Una boots in place Multiple foot ulcers  Psychiatric: She has a normal mood and affect. Her behavior is normal.      Past Medical History  Diagnosis Date  . History of chicken pox   . Hypertension   . Sclerodermia generalized   . Non Hodgkin's lymphoma     Treated with chemo 2010, felt to be cured.  . Melanoma 1999    History   Social History  . Marital Status: Married    Spouse Name: N/A    Number of Children: 1  . Years of Education: N/A   Occupational History  . homemaker    Social History Main Topics  . Smoking status: Never Smoker   . Smokeless tobacco: Not on file  . Alcohol Use: Yes     couple of times a week  . Drug Use: No  . Sexually Active: Not Currently   Other Topics Concern  . Not on file   Social History Narrative  . No narrative on file    Past Surgical History  Procedure Date  . Rt little toe removal 10/1998  . Stent left thigh 3/11  . Tonsillectomy and adenoidectomy   . Small intestine surgery   . Biopsy on cerebellum     Family History  Problem Relation Age of Onset  . Lupus Father   .  Lymphoma Father   . Hyperlipidemia Mother   . Cancer Daughter     sarcoma    Allergies  Allergen Reactions  . Sulfa Antibiotics     rash  . Penicillins Rash    Current Outpatient Prescriptions on File Prior to Visit  Medication Sig Dispense Refill  . amitriptyline (ELAVIL) 25 MG tablet Take 25 mg by mouth at bedtime.      Marland Kitchen aspirin 81 MG tablet Take 81 mg by mouth daily.        Marland Kitchen atorvastatin (LIPITOR) 40 MG tablet Take 1 tablet (40 mg total) by mouth daily.  30 tablet  11  . azelastine (ASTELIN) 137 MCG/SPRAY nasal spray Place 1 spray into the nose daily. Use in each nostril as  directed  30 mL  6  . Cholecalciferol (VITAMIN D) 2000 UNITS CAPS Take by mouth daily.        . citalopram (CELEXA) 20 MG tablet Take 1 tablet (20 mg total) by mouth daily.  30 tablet  11  . clopidogrel (PLAVIX) 75 MG tablet Take 75 mg by mouth daily.        . collagenase (SANTYL) ointment Apply 1 application topically 2 (two) times daily. To both legs.      Marland Kitchen doxycycline (VIBRA-TABS) 100 MG tablet Take 100 mg by mouth 2 (two) times daily.      . furosemide (LASIX) 20 MG tablet Take 20 mg by mouth 2 (two) times daily.        Marland Kitchen gabapentin (NEURONTIN) 300 MG capsule Take 1 capsule (300 mg total) by mouth 3 (three) times daily. 300 mg in the a.m. and 600 mg at night  90 capsule  11  . mometasone (NASONEX) 50 MCG/ACT nasal spray Place 2 sprays into the nose daily.  17 g  6  . multivitamin (THERAGRAN) per tablet Take 1 tablet by mouth daily.        Marland Kitchen NEXIUM 40 MG capsule Take 1 tablet by mouth Daily.      . OMEGA 3 1000 MG CAPS Take 3 capsules by mouth daily.       Marland Kitchen spironolactone (ALDACTONE) 25 MG tablet Take 25 mg by mouth daily.       . vitamin B-12 (CYANOCOBALAMIN) 500 MCG tablet Take 500 mcg by mouth daily.          BP 120/80  Pulse 84  Temp 98.2 F (36.8 C)  Resp 18  Ht _0  (1.727 m)  Wt 149 lb (67.586 kg)  BMI 22.66 kg/m2       Assessment & Plan:  Discussion of pulmonary HTN with possible therapeutic options to include Cellcept or Revadio? She will have a CBC differential and renal panel monitor today as part of our followup her blood pressure stable She will continue to follow up with Dr Gwenette Greet and with cardiology locally with plans to continue with Rhematology in [place]. Seeing GI for colon with Carlean Purl.

## 2012-04-08 NOTE — Patient Instructions (Signed)
The patient is instructed to continue all medications as prescribed. Schedule followup with check out clerk upon leaving the clinic

## 2012-04-08 NOTE — Progress Notes (Signed)
  Subjective:    [name], female    DOB: [birth year], 61 y.o.   MRN: [id number]  HPI Pt with scleroderma and foot ulcers  Seen in the wound center and una boots placed  For one week. Seeing Dr London Pepper. Lost significant blood at last debridement. Blood pressure stable Breathing stable but has seen Dr Gwenette Greet and echo noted pulmonary HTN and a cath is planned. Seeing Dr Zoila Shutter to discuss this procedure. Notes late afternoon SOB   Review of Systems  Constitutional: Negative for activity change, appetite change and fatigue.  HENT: Negative for ear pain, congestion, neck pain, postnasal drip and sinus pressure.   Eyes: Negative for redness and visual disturbance.  Respiratory: Negative for cough, shortness of breath and wheezing.   Gastrointestinal: Negative for abdominal pain and abdominal distention.  Genitourinary: Negative for dysuria, frequency and menstrual problem.  Musculoskeletal: Negative for myalgias, joint swelling and arthralgias.  Skin: Negative for rash and wound.  Neurological: Negative for dizziness, weakness and headaches.  Hematological: Negative for adenopathy. Does not bruise/bleed easily.  Psychiatric/Behavioral: Negative for sleep disturbance and decreased concentration.       Objective:   Physical Exam  Nursing note and vitals reviewed. Constitutional: She is oriented to person, place, and time. She appears well-developed and well-nourished. No distress.  HENT:  Head: Normocephalic and atraumatic.  Right Ear: External ear normal.  Left Ear: External ear normal.  Nose: Nose normal.  Mouth/Throat: Oropharynx is clear and moist.  Eyes: Conjunctivae and EOM are normal. Pupils are equal, round, and reactive to light.  Neck: Normal range of motion. Neck supple. No JVD present. No tracheal deviation present. No thyromegaly present.  Cardiovascular: Normal rate, regular rhythm, normal heart sounds and intact distal pulses.   No murmur  heard. Pulmonary/Chest: Effort normal and breath sounds normal. She has no wheezes. She exhibits no tenderness.  Abdominal: Soft. Bowel sounds are normal.  Musculoskeletal: Normal range of motion. She exhibits no edema and no tenderness.  Lymphadenopathy:    She has no cervical adenopathy.  Neurological: She is alert and oriented to person, place, and time. She has normal reflexes. No cranial nerve deficit.  Skin: Skin is warm and dry. She is not diaphoretic.  Psychiatric: She has a normal mood and affect. Her behavior is normal.          Assessment & Plan:

## 2012-04-08 NOTE — Progress Notes (Signed)
Addended by: Ricard Dillon on: 04/08/2012 11:45 AM   Modules accepted: Orders

## 2012-04-09 ENCOUNTER — Encounter (HOSPITAL_COMMUNITY): Payer: Self-pay | Admitting: *Deleted

## 2012-04-09 ENCOUNTER — Ambulatory Visit (HOSPITAL_COMMUNITY)
Admission: RE | Admit: 2012-04-09 | Discharge: 2012-04-09 | Disposition: A | Discharge status: Home / Self Care | Payer: BC Managed Care – PPO | Source: Ambulatory Visit | Attending: Internal Medicine | Admitting: Internal Medicine

## 2012-04-09 VITALS — BP 124/64 | HR 80 | Wt 148.5 lb

## 2012-04-09 DIAGNOSIS — R69 Illness, unspecified: Secondary | ICD-10-CM

## 2012-04-09 DIAGNOSIS — I2729 Other secondary pulmonary hypertension: Secondary | ICD-10-CM

## 2012-04-09 DIAGNOSIS — R0609 Other forms of dyspnea: Secondary | ICD-10-CM | POA: Insufficient documentation

## 2012-04-09 DIAGNOSIS — I2789 Other specified pulmonary heart diseases: Secondary | ICD-10-CM | POA: Insufficient documentation

## 2012-04-09 DIAGNOSIS — R0600 Dyspnea, unspecified: Secondary | ICD-10-CM

## 2012-04-09 DIAGNOSIS — R0989 Other specified symptoms and signs involving the circulatory and respiratory systems: Secondary | ICD-10-CM | POA: Insufficient documentation

## 2012-04-09 NOTE — Progress Notes (Signed)
Referring Physician: Primary Care: Primary Cardiologist:  Weight Range: Baseline proBNP:  HPI: Ms. Radin is a 61 y.o. 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  The patient has been treated with tracleer and revatio for approximately one to 2 years, and then tells me the medications were stopped because the echo "looked good".  Initially was in [place] then moved to [place] for 2 years. Has been in [place] for 1 year.   With regard to her scleroderma.  Has been following with Dr. Ricky Stabs in [place]. Prednisone was stopped several years ago and has not been anything for her scleroderma since that time.  Diagnosed with PAH in 2007 in [place]/ She did have a right heart catheterization by her history for verification. The patient has been treated with cellcept, tracleer and revatio for approximately one to 2 years, and then tells me the medications were stopped in [place] because the echo "looked good".  I do not have her old records available for review. Apparently she was followed very closely with serial echocardiograms that remained fairly normal, and therefore she was left off medications. She had a recent echo last month in [place] as a f/u for her chemotherapy. LV EF 65-70%. RV severely enlarged/HK severe TR. pRVSP 115-120. She saw Dr. Gwenette Greet 2-3 weeks ago and referred here for [place]. Also had CXR which showed RML PNA and treated with abx. No evidence of pulmonary fibrosis.   Over the last few weeks has noticed exertional dyspnea. She lost her daughter to cancer last year and now is helping to take care of her young granddaughter. Denies syncope or presyncope. Struggling with leg ulcers and neuropathy. Has severe Raynaud's. Due to see Dr. Ricky Stabs this summer. Mild edema which is well controlled. Walks slowly but can get around whole store without too much problem. Struggles with  stairs.  Takes lasix and spironolactone. Weight stable.   Also has a h/o PAD and has bilateral lower extremity stents and started on Plavix. No claudication currently.   PFTs pending.    Review of Systems: [y] = yes, _0  = no   General: Weight gain _1 ; Weight loss _2 ; Anorexia _3 ; Fatigue _4 ; Fever _5 ; Chills _6 ; Weakness _7   Cardiac: Chest pain/pressure _8 ; Resting SOB _9 ; Exertional SOB _10 ; Orthopnea _11 ; Pedal Edema _12 ; Palpitations _13 ; Syncope _14 ; Presyncope _15 ; Paroxysmal nocturnal dyspnea_16   Pulmonary: Cough _17 ; Wheezing_18 ; Hemoptysis_19 ; Sputum _20 ; Snoring _21   GI: Vomiting_22 ; Dysphagia_23 ; Melena_24 ; Hematochezia _25 ; Heartburn_26 ; Abdominal pain _27 ; Constipation _28 ; Diarrhea _29 ; BRBPR _30   GU: Hematuria_31 ; Dysuria _32 ; Nocturia_33   Vascular: Pain in legs with walking _34 ; Pain in feet with lying flat _35 ; Non-healing sores _36 ; Stroke _37 ; TIA _38 ; Slurred speech _39 ;  Neuro: Headaches_40 ; Vertigo_41 ; Seizures_42 ; Paresthesias_43 ;Blurred vision _44 ; Diplopia _45 ; Vision changes _46   Ortho/Skin: Arthritis _47 ; Joint pain _48 ; Muscle pain _49 ; Joint swelling _50 ; Back Pain _51 ; Rash _52   Psych: Depression_53 ; Anxiety_54   Heme: Bleeding problems _55 ; Clotting disorders _56 ; Anemia _57   Endocrine: Diabetes _58 ; Thyroid dysfunction_59    Past Medical History  Diagnosis Date  . History of  chicken pox   . Hypertension   . Sclerodermia generalized   . Non Hodgkin's lymphoma     Treated with chemo 2010, felt to be cured.  . Melanoma 1999  . Peripheral arterial occlusive disease   . Neuropathic foot ulcer   . DOE (dyspnea on exertion)     Current Outpatient Prescriptions  Medication Sig Dispense Refill  . amitriptyline (ELAVIL) 25 MG tablet Take 25 mg by mouth at bedtime.      Marland Kitchen aspirin 81 MG tablet Take 81 mg by mouth daily.        Marland Kitchen atorvastatin (LIPITOR) 40 MG tablet Take 1 tablet (40 mg total) by mouth daily.  30 tablet  11  . azelastine (ASTELIN)  137 MCG/SPRAY nasal spray Place 1 spray into the nose daily. Use in each nostril as directed  30 mL  6  . Cholecalciferol (VITAMIN D) 2000 UNITS CAPS Take by mouth daily.        . citalopram (CELEXA) 20 MG tablet Take 1 tablet (20 mg total) by mouth daily.  30 tablet  11  . clopidogrel (PLAVIX) 75 MG tablet Take 75 mg by mouth daily.        . collagenase (SANTYL) ointment Apply 1 application topically 2 (two) times daily. To both legs.      Marland Kitchen doxycycline (VIBRA-TABS) 100 MG tablet Take 100 mg by mouth 2 (two) times daily.      . furosemide (LASIX) 20 MG tablet Take 20 mg by mouth 2 (two) times daily.        Marland Kitchen gabapentin (NEURONTIN) 300 MG capsule Take 1 capsule (300 mg total) by mouth 3 (three) times daily. 300 mg in the a.m. and 600 mg at night  90 capsule  11  . mometasone (NASONEX) 50 MCG/ACT nasal spray Place 2 sprays into the nose daily.  17 g  6  . multivitamin (THERAGRAN) per tablet Take 1 tablet by mouth daily.        Marland Kitchen NEXIUM 40 MG capsule Take 1 tablet by mouth Daily.      . OMEGA 3 1000 MG CAPS Take 3 capsules by mouth daily.       Marland Kitchen spironolactone (ALDACTONE) 25 MG tablet Take 25 mg by mouth daily.       . vitamin B-12 (CYANOCOBALAMIN) 500 MCG tablet Take 500 mcg by mouth daily.          Allergies  Allergen Reactions  . Sulfa Antibiotics     rash  . Penicillins Rash    History   Social History  . Marital Status: Married    Spouse Name: N/A    Number of Children: 1  . Years of Education: N/A   Occupational History  . homemaker    Social History Main Topics  . Smoking status: Never Smoker   . Smokeless tobacco: Not on file  . Alcohol Use: Yes     couple of times a week  . Drug Use: No  . Sexually Active: Not Currently   Other Topics Concern  . Not on file   Social History Narrative  . No narrative on file    Family History  Problem Relation Age of Onset  . Lupus Father   . Lymphoma Father   . Hyperlipidemia Mother   . Cancer Daughter     sarcoma  .  Colon cancer      PHYSICAL EXAM: Filed Vitals:   04/09/12 1225  BP: 124/64  Pulse: 80  Weight: 148  lb 8 oz (67.359 kg)    General: No acute distress No respiratory difficulty HEENT: normal Neck: supple. JVP 6. Carotids 2+ bilat; no bruits. No lymphadenopathy or thryomegaly appreciated. Cor: PMI nondisplaced. Regular rate & rhythm. Prominent P2. Soft TR murmur. No obvious RV lift Lungs: clear Abdomen: soft, nontender, nondistended. No hepatosplenomegaly. No bruits or masses. Good bowel sounds. 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 + gouty nodules. LEs wrapped due to ulcers. + edema Neuro: alert & oriented x 3, cranial nerves grossly intact. moves all 4 extremities w/o difficulty. Affect pleasant.    ASSESSMENT & PLAN:

## 2012-04-09 NOTE — Assessment & Plan Note (Signed)
She has evidence of severe PAH by ECHO in the setting of scleroderma. NYHA II-III with mild volume overload. Will plan RHC next week with probable re-initiation of combination therapy with bosentan and sildenafil once we have the data. Would have low threshold to consider IV therapies given degree of her PAH. She was unable to perform 6 minute walk today but we will schedule for next week pre-cath. Have suggested she also continue to follow closely with rheumatology for close management of her scleroderma with particular attention as to whether or not she should be back on Cellcept.

## 2012-04-09 NOTE — Patient Instructions (Signed)
You are scheduled for a Right Heart Cath on Friday 4/19, please see instruction sheet   Your physician recommends that you return for lab work in: Thursday 4/18 to [facility] on [address]

## 2012-04-10 ENCOUNTER — Other Ambulatory Visit (INDEPENDENT_AMBULATORY_CARE_PROVIDER_SITE_OTHER): Payer: BC Managed Care – PPO

## 2012-04-10 DIAGNOSIS — I2789 Other specified pulmonary heart diseases: Secondary | ICD-10-CM

## 2012-04-10 DIAGNOSIS — R69 Illness, unspecified: Secondary | ICD-10-CM

## 2012-04-10 DIAGNOSIS — R0600 Dyspnea, unspecified: Secondary | ICD-10-CM

## 2012-04-10 DIAGNOSIS — R0989 Other specified symptoms and signs involving the circulatory and respiratory systems: Secondary | ICD-10-CM

## 2012-04-10 DIAGNOSIS — I2729 Other secondary pulmonary hypertension: Secondary | ICD-10-CM

## 2012-04-10 DIAGNOSIS — R0609 Other forms of dyspnea: Secondary | ICD-10-CM

## 2012-04-10 LAB — CBC
HCT: 48.2 % — ABNORMAL HIGH (ref 36.0–46.0)
Hemoglobin: 15.6 g/dL — ABNORMAL HIGH (ref 12.0–15.0)
MCHC: 32.4 g/dL (ref 30.0–36.0)
MCV: 102.6 fl — ABNORMAL HIGH (ref 78.0–100.0)
Platelets: 292 10*3/uL (ref 150.0–400.0)
RBC: 4.7 Mil/uL (ref 3.87–5.11)
RDW: 17.6 % — ABNORMAL HIGH (ref 11.5–14.6)
WBC: 7.7 10*3/uL (ref 4.5–10.5)

## 2012-04-10 LAB — BASIC METABOLIC PANEL
BUN: 20 mg/dL (ref 6–23)
CO2: 25 mEq/L (ref 19–32)
Calcium: 8.9 mg/dL (ref 8.4–10.5)
Chloride: 99 mEq/L (ref 96–112)
Creatinine, Ser: 1 mg/dL (ref 0.4–1.2)
GFR: 60.3 mL/min (ref >60.00)
Glucose, Bld: 110 mg/dL — ABNORMAL HIGH (ref 70–99)
Potassium: 4.2 mEq/L (ref 3.5–5.1)
Sodium: 136 mEq/L (ref 135–145)

## 2012-04-10 LAB — PROTIME-INR
INR: 1.1 ratio — ABNORMAL HIGH (ref 0.8–1.0)
Prothrombin Time: 12.3 s (ref 10.2–12.4)

## 2012-04-11 ENCOUNTER — Ambulatory Visit: Payer: BC Managed Care – PPO | Admitting: Internal Medicine

## 2012-04-17 ENCOUNTER — Other Ambulatory Visit (HOSPITAL_COMMUNITY): Payer: Self-pay | Admitting: Adult Health

## 2012-04-17 DIAGNOSIS — I272 Pulmonary hypertension, unspecified: Secondary | ICD-10-CM

## 2012-04-18 ENCOUNTER — Encounter (HOSPITAL_BASED_OUTPATIENT_CLINIC_OR_DEPARTMENT_OTHER)
Admission: RE | Disposition: A | Discharge status: Home / Self Care | Payer: Self-pay | Source: Ambulatory Visit | Attending: Internal Medicine

## 2012-04-18 ENCOUNTER — Ambulatory Visit (HOSPITAL_COMMUNITY)
Admission: RE | Admit: 2012-04-18 | Discharge: 2012-04-18 | Disposition: A | Discharge status: Home / Self Care | Payer: BC Managed Care – PPO | Source: Ambulatory Visit | Attending: Internal Medicine | Admitting: Internal Medicine

## 2012-04-18 ENCOUNTER — Inpatient Hospital Stay (HOSPITAL_BASED_OUTPATIENT_CLINIC_OR_DEPARTMENT_OTHER)
Admission: RE | Admit: 2012-04-18 | Discharge: 2012-04-18 | Disposition: A | Discharge status: Home / Self Care | Payer: BC Managed Care – PPO | Source: Ambulatory Visit | Attending: Internal Medicine | Admitting: Internal Medicine

## 2012-04-18 DIAGNOSIS — M349 Systemic sclerosis, unspecified: Secondary | ICD-10-CM | POA: Insufficient documentation

## 2012-04-18 DIAGNOSIS — I10 Essential (primary) hypertension: Secondary | ICD-10-CM | POA: Insufficient documentation

## 2012-04-18 DIAGNOSIS — Z79899 Other long term (current) drug therapy: Secondary | ICD-10-CM | POA: Insufficient documentation

## 2012-04-18 DIAGNOSIS — I272 Pulmonary hypertension, unspecified: Secondary | ICD-10-CM

## 2012-04-18 DIAGNOSIS — I739 Peripheral vascular disease, unspecified: Secondary | ICD-10-CM | POA: Insufficient documentation

## 2012-04-18 DIAGNOSIS — I2789 Other specified pulmonary heart diseases: Secondary | ICD-10-CM

## 2012-04-18 DIAGNOSIS — I279 Pulmonary heart disease, unspecified: Secondary | ICD-10-CM

## 2012-04-18 LAB — POCT I-STAT 3, VENOUS BLOOD GAS (G3P V)
Acid-base deficit: 1 mmol/L (ref 0.0–2.0)
Bicarbonate: 24.9 mEq/L — ABNORMAL HIGH (ref 20.0–24.0)
Bicarbonate: 25 mEq/L — ABNORMAL HIGH (ref 20.0–24.0)
O2 Saturation: 41 %
O2 Saturation: 43 %
TCO2: 26 mmol/L (ref 0–100)
TCO2: 26 mmol/L (ref 0–100)
pCO2, Ven: 42.1 mmHg — ABNORMAL LOW (ref 45.0–50.0)
pCO2, Ven: 43.3 mmHg — ABNORMAL LOW (ref 45.0–50.0)
pH, Ven: 7.367 — ABNORMAL HIGH (ref 7.250–7.300)
pH, Ven: 7.382 — ABNORMAL HIGH (ref 7.250–7.300)
pO2, Ven: 24 mmHg — CL (ref 30.0–45.0)
pO2, Ven: 25 mmHg — CL (ref 30.0–45.0)

## 2012-04-18 LAB — POCT I-STAT 3, ART BLOOD GAS (G3+)
Acid-base deficit: 3 mmol/L — ABNORMAL HIGH (ref 0.0–2.0)
Bicarbonate: 21.1 mEq/L (ref 20.0–24.0)
Bicarbonate: 24.2 mEq/L — ABNORMAL HIGH (ref 20.0–24.0)
Bicarbonate: 24.2 mEq/L — ABNORMAL HIGH (ref 20.0–24.0)
O2 Saturation: 76 %
O2 Saturation: 79 %
O2 Saturation: 79 %
TCO2: 22 mmol/L (ref 0–100)
TCO2: 25 mmol/L (ref 0–100)
TCO2: 25 mmol/L (ref 0–100)
pCO2 arterial: 35.9 mmHg (ref 35.0–45.0)
pCO2 arterial: 36.1 mmHg (ref 35.0–45.0)
pCO2 arterial: 36.2 mmHg (ref 35.0–45.0)
pH, Arterial: 7.376 (ref 7.350–7.400)
pH, Arterial: 7.434 — ABNORMAL HIGH (ref 7.350–7.400)
pH, Arterial: 7.437 — ABNORMAL HIGH (ref 7.350–7.400)
pO2, Arterial: 41 mmHg — ABNORMAL LOW (ref 80.0–100.0)
pO2, Arterial: 42 mmHg — ABNORMAL LOW (ref 80.0–100.0)
pO2, Arterial: 42 mmHg — ABNORMAL LOW (ref 80.0–100.0)

## 2012-04-18 SURGERY — JV RIGHT HEART CATHETERIZATION
Anesthesia: Moderate Sedation

## 2012-04-18 MED ORDER — SODIUM CHLORIDE 0.9 % IJ SOLN: 3.0000 mL | Freq: Two times a day (BID) | INTRAMUSCULAR | Status: DC

## 2012-04-18 MED ORDER — ASPIRIN 81 MG PO CHEW
324.0000 mg | CHEWABLE_TABLET | ORAL | Status: AC
  Administered 2012-04-18: 324 mg via ORAL

## 2012-04-18 MED ORDER — ONDANSETRON HCL 4 MG/2ML IJ SOLN: 4.0000 mg | Freq: Four times a day (QID) | INTRAMUSCULAR | Status: DC | PRN

## 2012-04-18 MED ORDER — DIAZEPAM 5 MG PO TABS
5.0000 mg | ORAL_TABLET | ORAL | Status: AC
  Administered 2012-04-18: 5 mg via ORAL

## 2012-04-18 MED ORDER — SODIUM CHLORIDE 0.9 % IJ SOLN: 3.0000 mL | INTRAMUSCULAR | Status: DC | PRN

## 2012-04-18 MED ORDER — SODIUM CHLORIDE 0.9 % IV SOLN
INTRAVENOUS | Status: DC
  Administered 2012-04-18: 13:00:00 via INTRAVENOUS

## 2012-04-18 MED ORDER — SODIUM CHLORIDE 0.9 % IV SOLN: 250.0000 mL | INTRAVENOUS | Status: DC | PRN

## 2012-04-18 MED ORDER — ACETAMINOPHEN 325 MG PO TABS: 650.0000 mg | ORAL_TABLET | ORAL | Status: DC | PRN

## 2012-04-18 MED ORDER — GUAIFENESIN-CODEINE 100-10 MG/5ML PO SOLN
5.0000 mL | Freq: Once | ORAL | Status: AC
  Administered 2012-04-18: 5 mL via ORAL

## 2012-04-18 NOTE — Progress Notes (Signed)
Bedrest begins @ 1515.  Dr. [name] in to discuss results with patient and husband.  Home oxygen arranged for patient with [place].

## 2012-04-18 NOTE — H&P (View-Only) (Signed)
Referring Physician: Primary Care: Primary Cardiologist:  Weight Range: Baseline proBNP:  HPI: Ms. Standley is a 60 y.o. 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  The patient has been treated with tracleer and revatio for approximately one to 2 years, and then tells me the medications were stopped because the echo "looked good".  Initially was in [place] then moved to [place] for 2 years. Has been in [place] for 1 year.   With regard to her scleroderma.  Has been following with Dr. Kassab in [place]. Prednisone was stopped several years ago and has not been anything for her scleroderma since that time.  Diagnosed with PAH in 2007 in [place]/ She did have a right heart catheterization by her history for verification. The patient has been treated with cellcept, tracleer and revatio for approximately one to 2 years, and then tells me the medications were stopped in [place] because the echo "looked good".  I do not have her old records available for review. Apparently she was followed very closely with serial echocardiograms that remained fairly normal, and therefore she was left off medications. She had a recent echo last month in [place] as a f/u for her chemotherapy. LV EF 65-70%. RV severely enlarged/HK severe TR. pRVSP 115-120. She saw Dr. Clance 2-3 weeks ago and referred here for RHC. Also had CXR which showed RML PNA and treated with abx. No evidence of pulmonary fibrosis.   Over the last few weeks has noticed exertional dyspnea. She lost her daughter to cancer last year and now is helping to take care of her young granddaughter. Denies syncope or presyncope. Struggling with leg ulcers and neuropathy. Has severe Raynaud's. Due to see Dr. Kassab this summer. Mild edema which is well controlled. Walks slowly but can get around whole store without too much problem. Struggles with  stairs.  Takes lasix and spironolactone. Weight stable.   Also has a h/o PAD and has bilateral lower extremity stents and started on Plavix. No claudication currently.   PFTs pending.    Review of Systems: [y] = yes, [ ] = no   General: Weight gain [ ]; Weight loss [ ]; Anorexia [ ]; Fatigue [ ]; Fever [ ]; Chills [ ]; Weakness [ ]  Cardiac: Chest pain/pressure [ ]; Resting SOB [ ]; Exertional SOB [ ]; Orthopnea [ ]; Pedal Edema [ ]; Palpitations [ ]; Syncope [ ]; Presyncope [ ]; Paroxysmal nocturnal dyspnea[ ]  Pulmonary: Cough [ ]; Wheezing[ ]; Hemoptysis[ ]; Sputum [ ]; Snoring [ ]  GI: Vomiting[ ]; Dysphagia[ ]; Melena[ ]; Hematochezia [ ]; Heartburn[ ]; Abdominal pain [ ]; Constipation [ ]; Diarrhea [ ]; BRBPR [ ]  GU: Hematuria[ ]; Dysuria [ ]; Nocturia[ ]  Vascular: Pain in legs with walking [ ]; Pain in feet with lying flat [ ]; Non-healing sores [ ]; Stroke [ ]; TIA [ ]; Slurred speech [ ];  Neuro: Headaches[ ]; Vertigo[ ]; Seizures[ ]; Paresthesias[ ];Blurred vision [ ]; Diplopia [ ]; Vision changes [ ]  Ortho/Skin: Arthritis [ ]; Joint pain [ ]; Muscle pain [ ]; Joint swelling [ ]; Back Pain [ ]; Rash [ ]  Psych: Depression[ ]; Anxiety[ ]  Heme: Bleeding problems [ ]; Clotting disorders [ ]; Anemia [ ]  Endocrine: Diabetes [ ]; Thyroid dysfunction[ ]   Past Medical History  Diagnosis Date  . History of   chicken pox   . Hypertension   . Sclerodermia generalized   . Non Hodgkin's lymphoma     Treated with chemo 2010, felt to be cured.  . Melanoma 1999  . Peripheral arterial occlusive disease   . Neuropathic foot ulcer   . DOE (dyspnea on exertion)     Current Outpatient Prescriptions  Medication Sig Dispense Refill  . amitriptyline (ELAVIL) 25 MG tablet Take 25 mg by mouth at bedtime.      . aspirin 81 MG tablet Take 81 mg by mouth daily.        . atorvastatin (LIPITOR) 40 MG tablet Take 1 tablet (40 mg total) by mouth daily.  30 tablet  11  . azelastine (ASTELIN)  137 MCG/SPRAY nasal spray Place 1 spray into the nose daily. Use in each nostril as directed  30 mL  6  . Cholecalciferol (VITAMIN D) 2000 UNITS CAPS Take by mouth daily.        . citalopram (CELEXA) 20 MG tablet Take 1 tablet (20 mg total) by mouth daily.  30 tablet  11  . clopidogrel (PLAVIX) 75 MG tablet Take 75 mg by mouth daily.        . collagenase (SANTYL) ointment Apply 1 application topically 2 (two) times daily. To both legs.      . doxycycline (VIBRA-TABS) 100 MG tablet Take 100 mg by mouth 2 (two) times daily.      . furosemide (LASIX) 20 MG tablet Take 20 mg by mouth 2 (two) times daily.        . gabapentin (NEURONTIN) 300 MG capsule Take 1 capsule (300 mg total) by mouth 3 (three) times daily. 300 mg in the a.m. and 600 mg at night  90 capsule  11  . mometasone (NASONEX) 50 MCG/ACT nasal spray Place 2 sprays into the nose daily.  17 g  6  . multivitamin (THERAGRAN) per tablet Take 1 tablet by mouth daily.        . NEXIUM 40 MG capsule Take 1 tablet by mouth Daily.      . OMEGA 3 1000 MG CAPS Take 3 capsules by mouth daily.       . spironolactone (ALDACTONE) 25 MG tablet Take 25 mg by mouth daily.       . vitamin B-12 (CYANOCOBALAMIN) 500 MCG tablet Take 500 mcg by mouth daily.          Allergies  Allergen Reactions  . Sulfa Antibiotics     rash  . Penicillins Rash    History   Social History  . Marital Status: Married    Spouse Name: N/A    Number of Children: 1  . Years of Education: N/A   Occupational History  . homemaker    Social History Main Topics  . Smoking status: Never Smoker   . Smokeless tobacco: Not on file  . Alcohol Use: Yes     couple of times a week  . Drug Use: No  . Sexually Active: Not Currently   Other Topics Concern  . Not on file   Social History Narrative  . No narrative on file    Family History  Problem Relation Age of Onset  . Lupus Father   . Lymphoma Father   . Hyperlipidemia Mother   . Cancer Daughter     sarcoma  .  Colon cancer      PHYSICAL EXAM: Filed Vitals:   04/09/12 1225  BP: 124/64  Pulse: 80  Weight: 148   lb 8 oz (67.359 kg)    General: No acute distress No respiratory difficulty HEENT: normal Neck: supple. JVP 6. Carotids 2+ bilat; no bruits. No lymphadenopathy or thryomegaly appreciated. Cor: PMI nondisplaced. Regular rate & rhythm. Prominent P2. Soft TR murmur. No obvious RV lift Lungs: clear Abdomen: soft, nontender, nondistended. No hepatosplenomegaly. No bruits or masses. Good bowel sounds. 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 + gouty nodules. LEs wrapped due to ulcers. + edema Neuro: alert & oriented x 3, cranial nerves grossly intact. moves all 4 extremities w/o difficulty. Affect pleasant.    ASSESSMENT & PLAN:   

## 2012-04-18 NOTE — CV Procedure (Signed)
Cardiac Cath Procedure Note:  Indication: PAH  Procedures performed:  1) Right heart catheterization  Description of procedure:   The risks and indication of the procedure were explained. Consent was signed and placed on the chart. An appropriate timeout was taken prior to the procedure. The right groin was prepped and draped in the routine sterile fashion and anesthetized with 1% local lidocaine.   A 7 FR venous sheath was placed in the right femoral vein using a modified Seldinger technique. A standard Swan-Ganz catheter was used for the procedure.   Complications: None apparent.  Findings:  RA = 9 RV = 75/11/14 PA = 71/25 (44) PCW = 4 Fick cardiac output/index = 3.0/1.7 Thermo CO/CI = 2.7/1.5 PVR = 23.5 Woods FA sat = 79% on room air (checked 3 times). Sat with finger probe on air 94% PA sat = 41%, 43%  Assessment: 1. Moderate PAH with cor pulmonale in setting of scleroderma 2. Hypoxemia  Plan/Discussion:  She is on the verge of requiring IV therapies however she would like to avoid if possible. Will treat aggressively with bosentan and add sildenafil very shortly thereafter. Start home O2. Consider recath in 2-3 months, if outputs still low would have low threshold to switch to IV therapies.   Daniel Bensimhon 3:01 PM

## 2012-04-18 NOTE — Progress Notes (Signed)
Pt in for 6 minute walk test, she walked 880 feet

## 2012-04-18 NOTE — OR Nursing (Signed)
Patient stable and meal was served

## 2012-04-18 NOTE — Interval H&P Note (Signed)
History and Physical Interval Note:  04/18/2012 2:28 PM  [name] [name]  has presented today for surgery, with the diagnosis of pulmonary HTN  The various methods of treatment have been discussed with the patient and family. After consideration of risks, benefits and other options for treatment, the patient has consented to  Procedure(s) (LRB): JV RIGHT HEART CATHETERIZATION (N/A) as a surgical intervention .  The patients' history has been reviewed, patient examined, no change in status, stable for surgery.  I have reviewed the patients' chart and labs.  Questions were answered to the patient's satisfaction.     Giovannie Scerbo

## 2012-04-18 NOTE — OR Nursing (Signed)
Discharge instructions reviewed and signed, pt stated understanding, ambulated in hall without difficulty, site level 0, transported to husband's car via wheelchair

## 2012-04-21 ENCOUNTER — Telehealth (HOSPITAL_COMMUNITY): Payer: Self-pay | Admitting: *Deleted

## 2012-04-21 ENCOUNTER — Encounter (HOSPITAL_BASED_OUTPATIENT_CLINIC_OR_DEPARTMENT_OTHER): Payer: BC Managed Care – PPO

## 2012-04-21 NOTE — Telephone Encounter (Signed)
[name] [name] called today in regards to her recent cath.  She wants to know if they have decided what type of medicine they are going to put her on.  Please give her a call back. Thanks.

## 2012-04-21 NOTE — Telephone Encounter (Signed)
Order faxed to [place] for O2, have started paperwork for Tracleer, need pt's signature and labs have left her a mess to call back

## 2012-04-24 NOTE — Telephone Encounter (Signed)
Pt is coming in AM at 11 to sign paperwork and get labs

## 2012-04-25 ENCOUNTER — Ambulatory Visit (HOSPITAL_COMMUNITY)
Admission: RE | Admit: 2012-04-25 | Discharge: 2012-04-25 | Disposition: A | Discharge status: Home / Self Care | Payer: BC Managed Care – PPO | Source: Ambulatory Visit | Attending: Internal Medicine | Admitting: Internal Medicine

## 2012-04-25 ENCOUNTER — Telehealth (HOSPITAL_COMMUNITY): Payer: Self-pay | Admitting: Adult Health

## 2012-04-25 ENCOUNTER — Other Ambulatory Visit: Payer: Self-pay | Admitting: *Deleted

## 2012-04-25 ENCOUNTER — Other Ambulatory Visit: Payer: Self-pay | Admitting: Internal Medicine

## 2012-04-25 DIAGNOSIS — I272 Pulmonary hypertension, unspecified: Secondary | ICD-10-CM

## 2012-04-25 NOTE — Telephone Encounter (Signed)
Patient's labs from earlier hemolyzed. Called and asked her to get CMET 4/29 at [facility] office on [address]. She verbalized understand.

## 2012-04-28 ENCOUNTER — Other Ambulatory Visit (INDEPENDENT_AMBULATORY_CARE_PROVIDER_SITE_OTHER): Payer: BC Managed Care – PPO

## 2012-04-28 DIAGNOSIS — I272 Pulmonary hypertension, unspecified: Secondary | ICD-10-CM

## 2012-04-28 DIAGNOSIS — I2789 Other specified pulmonary heart diseases: Secondary | ICD-10-CM

## 2012-04-28 LAB — COMPREHENSIVE METABOLIC PANEL
ALT: 18 U/L (ref 0–35)
AST: 28 U/L (ref 0–37)
Albumin: 3.7 g/dL (ref 3.5–5.2)
Alkaline Phosphatase: 118 U/L — ABNORMAL HIGH (ref 39–117)
BUN: 24 mg/dL — ABNORMAL HIGH (ref 6–23)
CO2: 27 mEq/L (ref 19–32)
Calcium: 9.1 mg/dL (ref 8.4–10.5)
Chloride: 99 mEq/L (ref 96–112)
Creatinine, Ser: 1 mg/dL (ref 0.4–1.2)
GFR: 56.96 mL/min — ABNORMAL LOW (ref >60.00)
Glucose, Bld: 109 mg/dL — ABNORMAL HIGH (ref 70–99)
Potassium: 4.8 mEq/L (ref 3.5–5.1)
Sodium: 138 mEq/L (ref 135–145)
Total Bilirubin: 0.9 mg/dL (ref 0.3–1.2)
Total Protein: 7.3 g/dL (ref 6.0–8.3)

## 2012-04-28 NOTE — Telephone Encounter (Signed)
Pt came by on Friday and signed forms and had labs, paperwork faxed to [facility]

## 2012-04-30 ENCOUNTER — Telehealth (HOSPITAL_COMMUNITY): Payer: Self-pay | Admitting: *Deleted

## 2012-04-30 NOTE — Telephone Encounter (Signed)
[facility] is working on getting her Tracleer, pt is aware

## 2012-04-30 NOTE — Telephone Encounter (Signed)
[name] [name] called today. Her medication has not arrived yet.  She is anxious to get started and wanted you to be aware. Thanks.

## 2012-05-05 ENCOUNTER — Encounter (HOSPITAL_BASED_OUTPATIENT_CLINIC_OR_DEPARTMENT_OTHER): Payer: BC Managed Care – PPO | Attending: Internal Medicine

## 2012-05-05 DIAGNOSIS — M349 Systemic sclerosis, unspecified: Secondary | ICD-10-CM | POA: Insufficient documentation

## 2012-05-05 DIAGNOSIS — L97909 Non-pressure chronic ulcer of unspecified part of unspecified lower leg with unspecified severity: Secondary | ICD-10-CM | POA: Insufficient documentation

## 2012-05-05 DIAGNOSIS — I87319 Chronic venous hypertension (idiopathic) with ulcer of unspecified lower extremity: Secondary | ICD-10-CM | POA: Insufficient documentation

## 2012-05-05 DIAGNOSIS — L97309 Non-pressure chronic ulcer of unspecified ankle with unspecified severity: Secondary | ICD-10-CM | POA: Insufficient documentation

## 2012-05-05 DIAGNOSIS — Z79899 Other long term (current) drug therapy: Secondary | ICD-10-CM | POA: Insufficient documentation

## 2012-05-06 ENCOUNTER — Telehealth (HOSPITAL_COMMUNITY): Payer: Self-pay | Admitting: *Deleted

## 2012-05-06 NOTE — Telephone Encounter (Signed)
Pt is aware we are working on getting her Tracleer, PA was completed and faxed to [employer] this AM, will let her know when it is approved

## 2012-05-06 NOTE — Telephone Encounter (Signed)
Ms Dawkins called regarding her medications she was suppose to receive in the mail.  She has not gotten them yet and is wondering what is going on. Can you give her a call. Thanks.

## 2012-05-08 ENCOUNTER — Encounter (HOSPITAL_COMMUNITY): Payer: Self-pay

## 2012-05-08 ENCOUNTER — Ambulatory Visit (HOSPITAL_COMMUNITY)
Admission: RE | Admit: 2012-05-08 | Discharge: 2012-05-08 | Disposition: A | Discharge status: Home / Self Care | Payer: BC Managed Care – PPO | Source: Ambulatory Visit | Attending: Internal Medicine | Admitting: Internal Medicine

## 2012-05-08 VITALS — BP 98/58 | HR 64 | Resp 18 | Ht 68.0 in | Wt 149.1 lb

## 2012-05-08 DIAGNOSIS — R69 Illness, unspecified: Secondary | ICD-10-CM

## 2012-05-08 DIAGNOSIS — R0609 Other forms of dyspnea: Secondary | ICD-10-CM | POA: Insufficient documentation

## 2012-05-08 DIAGNOSIS — Z9221 Personal history of antineoplastic chemotherapy: Secondary | ICD-10-CM | POA: Insufficient documentation

## 2012-05-08 DIAGNOSIS — I739 Peripheral vascular disease, unspecified: Secondary | ICD-10-CM | POA: Insufficient documentation

## 2012-05-08 DIAGNOSIS — Z85820 Personal history of malignant melanoma of skin: Secondary | ICD-10-CM | POA: Insufficient documentation

## 2012-05-08 DIAGNOSIS — I2729 Other secondary pulmonary hypertension: Secondary | ICD-10-CM

## 2012-05-08 DIAGNOSIS — I10 Essential (primary) hypertension: Secondary | ICD-10-CM | POA: Insufficient documentation

## 2012-05-08 DIAGNOSIS — I2789 Other specified pulmonary heart diseases: Secondary | ICD-10-CM

## 2012-05-08 DIAGNOSIS — R0989 Other specified symptoms and signs involving the circulatory and respiratory systems: Secondary | ICD-10-CM | POA: Insufficient documentation

## 2012-05-08 DIAGNOSIS — Z7982 Long term (current) use of aspirin: Secondary | ICD-10-CM | POA: Insufficient documentation

## 2012-05-08 DIAGNOSIS — M349 Systemic sclerosis, unspecified: Secondary | ICD-10-CM | POA: Insufficient documentation

## 2012-05-08 NOTE — Patient Instructions (Addendum)
Your physician recommends that you schedule a follow-up appointment in: 3 weeks

## 2012-05-08 NOTE — Progress Notes (Signed)
Referring Physician: Primary Care: Primary Cardiologist:  Weight Range: Baseline proBNP:  HPI: Ms. Bankson is a 61 y.o. 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  The patient has been treated with tracleer and revatio for approximately one to 2 years, and then tells me the medications were stopped because the echo "looked good".  Initially was in [place] then moved to [place] for 2 years. Has been in [place] for 1 year.   With regard to her scleroderma.  Has been following with Dr. Ricky Stabs in [place]. Prednisone was stopped several years ago and has not been anything for her scleroderma since that time.  Diagnosed with PAH in 2007 in [place]/ She did have a right heart catheterization by her history for verification. The patient has been treated with cellcept, tracleer and revatio for approximately one to 2 years, and then tells me the medications were stopped in [place] because the echo "looked good".  I do not have her old records available for review. Apparently she was followed very closely with serial echocardiograms that remained fairly normal, and therefore she was left off medications. She had a recent echo last month in [place] as a f/u for her chemotherapy. LV EF 65-70%. RV severely enlarged/HK severe TR. pRVSP 115-120. She saw Dr. Gwenette Greet 2-3 weeks ago and referred here for Pleak. Also had CXR which showed RML PNA and treated with abx. No evidence of pulmonary fibrosis.   Underwent RHC on 4/19 as below:  RA = 9  RV = 75/11/14  PA = 71/25 (44)  PCW = 4  Fick cardiac output/index = 3.0/1.7  Thermo CO/CI = 2.7/1.5  PVR = 23.5 [place]  FA sat = 79% on room air (checked 3 times). Sat with finger probe on air 94%  PA sat = 41%, 43%  Assessment:  1. 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 Still  dyspneic with exertion. NYHA III. Has a hard time carrying the O2. No edema.  She lost her daughter to cancer last year and now is helping to take care of her young granddaughter. Denies syncope or presyncope. Struggling with leg ulcers and neuropathy. Has severe Raynaud's. Tracleer has just been approved and should start soon.  Takes lasix and spironolactone. Weight stable.   Also has a h/o PAD and has bilateral lower extremity stents and started on Plavix. No claudication currently.   PFTs pending.    Review of Systems: [y] = yes, _0  = no   General: Weight gain _1 ; Weight loss _2 ; Anorexia _3 ; Fatigue _4 ; Fever _5 ; Chills _6 ; Weakness _7   Cardiac: Chest pain/pressure _8 ; Resting SOB _9 ; Exertional SOB _10 ; Orthopnea _11 ; Pedal Edema _12 ; Palpitations _13 ; Syncope _14 ; Presyncope _15 ; Paroxysmal nocturnal dyspnea_16   Pulmonary: Cough _17 ; Wheezing_18 ; Hemoptysis_19 ; Sputum _20 ; Snoring _21   GI: Vomiting_22 ; Dysphagia_23 ; Melena_24 ; Hematochezia _25 ; Heartburn_26 ; Abdominal pain _27 ; Constipation _28 ; Diarrhea _29 ; BRBPR _30   GU: Hematuria_31 ; Dysuria _32 ; Nocturia_33   Vascular: Pain in legs with walking _34 ; Pain in feet with lying flat _35 ; Non-healing sores _36 ; Stroke _37 ; TIA _38 ; Slurred speech _39 ;  Neuro: Headaches_40 ; Vertigo_41 ;  Seizures_0 ; Paresthesias_1 ;Blurred vision _2 ; Diplopia _3 ; Vision changes _4   Ortho/Skin: Arthritis _5 ; Joint pain _6 ; Muscle pain _7 ; Joint swelling _8 ; Back Pain _9 ; Rash _10   Psych: Depression_11 ; Anxiety_12   Heme: Bleeding problems _13 ; Clotting disorders _14 ; Anemia _15   Endocrine: Diabetes _16 ; Thyroid dysfunction_17    Past Medical History  Diagnosis Date  . History of chicken pox   . Hypertension   . Sclerodermia generalized   . Non Hodgkin's lymphoma     Treated with chemo 2010, felt to be cured.  . Melanoma 1999  . Peripheral arterial occlusive disease   . Neuropathic foot ulcer   . DOE (dyspnea on exertion)      Current Outpatient Prescriptions  Medication Sig Dispense Refill  . amitriptyline (ELAVIL) 25 MG tablet Take 25 mg by mouth at bedtime.      Marland Kitchen aspirin 81 MG tablet Take 81 mg by mouth daily.        Marland Kitchen atorvastatin (LIPITOR) 40 MG tablet Take 1 tablet (40 mg total) by mouth daily.  30 tablet  11  . azelastine (ASTELIN) 137 MCG/SPRAY nasal spray Place 2 sprays into the nose daily. Use in each nostril as directed      . BIOTIN PO Take 1 tablet by mouth daily.      . Cholecalciferol (VITAMIN D) 2000 UNITS CAPS Take by mouth daily.        . citalopram (CELEXA) 20 MG tablet Take 1 tablet (20 mg total) by mouth daily.  30 tablet  11  . clopidogrel (PLAVIX) 75 MG tablet Take 75 mg by mouth daily.        . collagenase (SANTYL) ointment Apply 1 application topically 2 (two) times daily. To both legs.      Marland Kitchen doxycycline (VIBRA-TABS) 100 MG tablet Take 100 mg by mouth 2 (two) times daily.      . furosemide (LASIX) 20 MG tablet Take 20 mg by mouth 2 (two) times daily.        Marland Kitchen gabapentin (NEURONTIN) 300 MG capsule Take 300 mg by mouth 2 (two) times daily.      . mometasone (NASONEX) 50 MCG/ACT nasal spray Place 2 sprays into the nose daily.  17 g  6  . multivitamin (THERAGRAN) per tablet Take 1 tablet by mouth daily.        Marland Kitchen NEXIUM 40 MG capsule Take 1 tablet by mouth Daily.      . OMEGA 3 1000 MG CAPS Take 3 capsules by mouth daily.       . Probiotic Product (PROBIOTIC FORMULA PO) Take 1 capsule by mouth daily.      Marland Kitchen spironolactone (ALDACTONE) 25 MG tablet Take 25 mg by mouth daily.       . vitamin B-12 (CYANOCOBALAMIN) 500 MCG tablet Take 500 mcg by mouth 2 (two) times daily.       Marland Kitchen DISCONTD: azelastine (ASTELIN) 137 MCG/SPRAY nasal spray Place 1 spray into the nose daily. Use in each nostril as directed  30 mL  6  . DISCONTD: gabapentin (NEURONTIN) 300 MG capsule Take 1 capsule (300 mg total) by mouth 3 (three) times daily. 300 mg in the a.m. and 600 mg at night  90 capsule  11    Allergies   Allergen Reactions  . Sulfa Antibiotics     Severe rash with significant peeling of face. No airway involvement per patient  . Penicillins  Rash    History   Social History  . Marital Status: Married    Spouse Name: N/A    Number of Children: 1  . Years of Education: N/A   Occupational History  . homemaker    Social History Main Topics  . Smoking status: Never Smoker   . Smokeless tobacco: Not on file  . Alcohol Use: Yes     couple of times a week  . Drug Use: No  . Sexually Active: Not Currently   Other Topics Concern  . Not on file   Social History Narrative  . No narrative on file    Family History  Problem Relation Age of Onset  . Lupus Father   . Lymphoma Father   . Hyperlipidemia Mother   . Cancer Daughter     sarcoma  . Colon cancer      PHYSICAL EXAM: Filed Vitals:   05/08/12 1004  BP: 98/58  Pulse: 64  Resp: 18  Height: _0  (1.727 m)  Weight: 149 lb 1.9 oz (67.64 kg)    General: No acute distress No respiratory difficulty HEENT: normal Neck: supple. JVP 6. Carotids 2+ bilat; no bruits. No lymphadenopathy or thryomegaly appreciated. Cor: PMI nondisplaced. Regular rate & rhythm. Prominent P2. Soft TR murmur. No obvious RV lift Lungs: clear Abdomen: soft, nontender, nondistended. No hepatosplenomegaly. No bruits or masses. Good bowel sounds. 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 + gouty nodules. LEs wrapped due to ulcers. 1+ edema Neuro: alert & oriented x 3, cranial nerves grossly intact. moves all 4 extremities w/o difficulty. Affect pleasant.    ASSESSMENT & PLAN:

## 2012-05-08 NOTE — Assessment & Plan Note (Signed)
She has moderate to severe PAH with cor pulmonale. NYHA III. Should be starting Tracleer this week. Given severity of her disease will likely start Revatio for combination therapy in next several weeks once Tracleer on board. Continue O2. Can repeat 6MW with forehead probe to reassess need for home O2 once started on [facility] therapy. Volume status OK. See back in 1 month,

## 2012-05-08 NOTE — Progress Notes (Signed)
Encounter addended by: Scarlette Calico, RN on: 05/08/2012 11:05 AM<BR>     Documentation filed: Visit Diagnoses, Patient Instructions Section

## 2012-05-12 ENCOUNTER — Encounter (HOSPITAL_COMMUNITY): Payer: Self-pay | Admitting: Emergency Medicine

## 2012-05-12 ENCOUNTER — Telehealth (HOSPITAL_COMMUNITY): Payer: Self-pay | Admitting: *Deleted

## 2012-05-12 ENCOUNTER — Emergency Department (HOSPITAL_COMMUNITY)
Admission: EM | Admit: 2012-05-12 | Discharge: 2012-05-13 | Disposition: A | Discharge status: Home / Self Care | Payer: BC Managed Care – PPO | Attending: Emergency Medicine | Admitting: Emergency Medicine

## 2012-05-12 DIAGNOSIS — I10 Essential (primary) hypertension: Secondary | ICD-10-CM | POA: Insufficient documentation

## 2012-05-12 DIAGNOSIS — S91009A Unspecified open wound, unspecified ankle, initial encounter: Secondary | ICD-10-CM | POA: Insufficient documentation

## 2012-05-12 DIAGNOSIS — I83893 Varicose veins of bilateral lower extremities with other complications: Secondary | ICD-10-CM | POA: Insufficient documentation

## 2012-05-12 DIAGNOSIS — X58XXXA Exposure to other specified factors, initial encounter: Secondary | ICD-10-CM | POA: Insufficient documentation

## 2012-05-12 DIAGNOSIS — S81009A Unspecified open wound, unspecified knee, initial encounter: Secondary | ICD-10-CM | POA: Insufficient documentation

## 2012-05-12 DIAGNOSIS — I83899 Varicose veins of unspecified lower extremities with other complications: Secondary | ICD-10-CM

## 2012-05-12 NOTE — ED Notes (Signed)
PA Olean Ree at bedside.

## 2012-05-12 NOTE — ED Notes (Signed)
CWC:[id number]<GB> Expected date:<BR> Expected time:11:24 PM<BR> Means of arrival:<BR> Comments:<BR> M251 - 60yoF Bleeding varicose

## 2012-05-12 NOTE — ED Notes (Signed)
Pt BIB  EMS. 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 Pt bumped leg and vein to R leg began to bleed tonight. Presently, bleeding controlled with pressure bandage.

## 2012-05-12 NOTE — Telephone Encounter (Addendum)
[name] [name] called today, she still has not received her medications, wants you to be aware. Thanks.  Pt called back, accidentally erased a message from the pharmacy, and is waiting for them to call her back again. Thanks.

## 2012-05-13 NOTE — ED Notes (Signed)
PA Schulz at bedside. 

## 2012-05-13 NOTE — ED Notes (Signed)
61 year old female with a history of scleroderma and poorly healing wounds to her right anterior lower extremity. Presents after wound change this evening with heavy bleeding from the wound.  Physical exam shows that she has a large vein which has been injured, there is a heavily bleeding wound with dark red blood which is controllable with distal pressure but not proximal pressure or inflated blood pressure cuff.  Due to the size of the wound and the ongoing heavy bleeding it required suture with a figure-of-eight suture, see repair below. Post suture placement, with wound reinspected with no bleeding, no pain, good capillary refill distally.  LACERATION REPAIR Performed by: Johnna Acosta Authorized by: Johnna Acosta Consent: Verbal consent obtained. Risks and benefits: risks, benefits and alternatives were discussed Consent given by: patient Patient identity confirmed: provided demographic data Prepped and Draped in normal sterile fashion Wound explored  Laceration Location: Right lower extremity  Bleeding vein, approximately 0.25 cm  No Foreign Bodies seen or palpated  Anesthesia: local infiltration  Local anesthetic: lidocaine 1 % with epinephrine  Anesthetic total: 4 ml  Irrigation method: syringe Amount of cleaning: standard  Skin closure: 3-0 Prolene   Number of sutures: 1   Technique: Figure-of-eight   Patient tolerance: Patient tolerated the procedure well with no immediate complications.   Johnna Acosta, MD 05/13/12 [phone]

## 2012-05-13 NOTE — ED Provider Notes (Signed)
Medical screening examination/treatment/procedure(s) were conducted as a shared visit with non-physician practitioner(s) and myself.  I personally evaluated the patient during the encounter  Please see my separate respective documentation pertaining to this patient encounter   Johnna Acosta, MD 05/13/12 1428

## 2012-05-13 NOTE — Discharge Instructions (Signed)
With the dressing in place for 12 hours, then he may wash the area with water until the "quick clot" has been washed off, keep the area moistened with a thick coating of antibiotic ointment.  Keep the area covered with a dressing, you may experience a small amount of oozing, but she should not have bleeding like tonight.  Please followup with your wound care Dr. as scheduled on the 20th.  If you have any further difficulties, return to the emergency department or to your primary care physician.  The sutures should be left in for at least 10 days Bleeding Varicose Veins, Surgical Repair Surgery can be used to repair varicose veins that have caused bleeding. The first goal is to stop the bleeding. Then, the aim is to keep the bleeding from happening again. To do this, the vein can be:  Removed.   Sealed off.  Varicose veins are a sign that blood is not flowing to the heart as it should. Instead the blood has pooled in the veins because the small valves in the veins no longer prevent back flow of blood. This causes the veins to get bigger. These bigger veins push toward the surface of the skin. The skin over the veins becomes very thin. Even a small injury to a varicose vein can cause bleeding. Bleeding might result from:  A scrape from bumping a piece of furniture.   A nick from shaving.  Surgery is one way to keep the bleeding from happening over and over. The body has many veins. Some veins are very deep and cannot be seen. Closing or removing a few that are close to the surface will not cause a problem. There will still be plenty of blood flowing throughout your body. LET YOUR CAREGIVER KNOW ABOUT:   Any allergies.   All medications you are taking, including:   Herbs, eyedrops, over-the-counter medications, and creams.   Blood thinners (anticoagulants), aspirin or other drugs that could affect blood clotting.   Use of steroids (by mouth or as creams).   Previous problems with anesthetics,  including local anesthetics.   Possibility of pregnancy, if this applies.   Any history of blood clots.   Any history of bleeding or other blood problems.   Previous surgery.   Smoking history.   Other health problems.  RISKS AND COMPLICATIONS  Possible problems vary, depending on the method that was used. In general:  Short-term possibilities include:   Excessive bleeding.   Pain.   Redness or tenderness at the treatment site.   Swelling of the leg or ankle.   Loss of feeling (numbness) in the area treated. Tingling also is possible.   A pooling of blood in the wound (hematoma).   Infection.   Slow healing.   Longer-term possibilities include:   Scarring.   Skin damage or changes in color.   Damage to blood vessels in the area.   A return of bleeding. The same vein or another may bleed again. New treatment, or surgery, may be needed.  BEFORE THE PROCEDURE  Two weeks before your surgery, stop using aspirin and nonsteroidal anti-inflammatory drugs (NSAIDs) for pain relief. This includes prescription drugs and over-the-counter NSAIDs. Also stop taking vitamin E.   If you take blood thinners, ask your health care provider when you should stop taking them.   You might be asked to wear pressure stockings for a few days before the surgery.   You might be asked to wear a boot that is like  a cast Louretta Parma boot). It will help any open sores to heal better.   Do not eat or drink for about 8 hours before your surgery.   You might be asked to shower or wash with a special antibacterial soap before the procedure.   Arrive at least an hour before the surgery or whenever your surgeon recommends. This will give you time to check in and fill out any needed paperwork.   If this will be an outpatient procedure, you will be able to go home the same day. Sometimes people stay overnight in the hospital after the surgery. Ask your surgeon what to expect. Either way, make arrangements  in advance for someone to drive you home.  PROCEDURE Several methods can be used to treat varicose veins that have caused bleeding. The best one for you will depend on the location and size of the veins. Discuss the options with your surgeon. Make sure you understand what the procedure involves. To prepare for varicose vein surgery:  You will change into a hospital gown.   You will be given an IV; a needle will be inserted in your arm. Medication will be able to flow directly into your body through this needle.   You might be given a medication to help you relax (sedative).   One of two methods will keep you from feeling pain:   You may be given a drug that will put you to sleep during the surgery (general anesthetic).   The area around the veins will be numb, but you will remain awake (local anesthetic).   The procedure options include:   Ablation. This method uses heat to close varicose veins. A thin tube (catheter) is threaded into the vein. High frequency electrical current heats the walls of the vein. This destroys them. The procedure is sometimes called radiofrequency ablation.   Laser treatment. A cut (incision) is made in the skin near the vein. The surgeon uses a laser (high-energy light beam) to destroy the walls of the vein. The procedure takes about 30 minutes.   Ambulatory phlebectomy. Tiny cuts are made in the skin over the veins. Then, the vein is removed, section by section. This can take several hours. Ambulatory means that you will be able to walk as soon as the procedure is done. "Phleb" means vein and "ectomy" means to remove by surgery.   Stripping. The surgeon will make a small incision near the vein. Then, the vein will be tied off. This shuts off the blood supply. The vein can then be removed.  AFTER THE PROCEDURE  You will stay in a recovery area until the anesthesia has worn off. Your blood pressure and pulse will be checked every so often.   You might be given  more pain medication. Your leg may be wrapped, or you may be given compression stockings or a bandage. They are elastic. The pressure helps prevent bleeding in veins that have been cut.   Some surgeries are outpatient procedures. That means you will go home the same day. If not, you will be moved to a hospital room for an overnight stay.   Before you go home, you will be taught how to care for the dressing (medicine and bandage). You also will be told what you should and should not do while your veins heal. For instance, you might need to walk a lot to prevent blood clots.  PROGNOSIS  Surgical treatment for varicose veins usually works. Sometimes bleeding does return, or a different  vein bleeds. Be sure to talk to your health care providers about what you can do to keep this from happening. Document Released: 05/05/2009 Document Revised: 12/06/2011 Document Reviewed: 05/05/2009 [facility] Patient Information 2012 [place].

## 2012-05-13 NOTE — ED Provider Notes (Signed)
History     CSN: [id number]  Arrival date & time 05/12/12  2329   First MD Initiated Contact with Patient 05/12/12 2341      Chief Complaint  Patient presents with  . Bleeding Varicose Vein     (Consider location/radiation/quality/duration/timing/severity/associated sxs/prior treatment) HPI Comments: Patient has a history of sclera derma and lower extremity ulcers.  She is followed at the wound care clinic.  She recently had a small sore on her anterior right shin debrided.  At that time.  She had significant blood loss, but was able to be stopped with pressure.  Today.  She was changing the dressing and bleeding was again noted.  Pressure was applied, and the EMS was called.  They applied a pressure dressing and transported patient to the emergency department.  On arrival to the emergency room the wound was breathing, briskly pressure was again applied  The history is provided by the patient.    Past Medical History  Diagnosis Date  . History of chicken pox   . Hypertension   . Sclerodermia generalized   . Non Hodgkin's lymphoma     Treated with chemo 2010, felt to be cured.  . Melanoma 1999  . Peripheral arterial occlusive disease   . Neuropathic foot ulcer   . DOE (dyspnea on exertion)     Past Surgical History  Procedure Date  . Rt little toe removal 10/1998  . Stent left thigh 3/11  . Tonsillectomy and adenoidectomy   . Small intestine surgery   . Biopsy on cerebellum     Family History  Problem Relation Age of Onset  . Lupus Father   . Lymphoma Father   . Hyperlipidemia Mother   . Cancer Daughter     sarcoma  . Colon cancer      History  Substance Use Topics  . Smoking status: Never Smoker   . Smokeless tobacco: Not on file  . Alcohol Use: Yes     couple of times a week    OB History    Grav Para Term Preterm Abortions TAB SAB Ect Mult Living                  Review of Systems  Constitutional: Negative for fever.  Skin: Positive for wound.    Neurological: Negative for dizziness and weakness.    Allergies  Sulfa antibiotics and Penicillins  Home Medications   Current Outpatient Rx  Name Route Sig Dispense Refill  . AMITRIPTYLINE HCL 25 MG PO TABS Oral Take 25 mg by mouth at bedtime.    . ASPIRIN 81 MG PO TABS Oral Take 81 mg by mouth daily.      . ATORVASTATIN CALCIUM 40 MG PO TABS Oral Take 1 tablet (40 mg total) by mouth daily. 30 tablet 11  . AZELASTINE HCL 137 MCG/SPRAY NA SOLN Nasal Place 2 sprays into the nose daily. Use in each nostril as directed    . BIOTIN PO Oral Take 1 tablet by mouth daily.    Marland Kitchen VITAMIN D 2000 UNITS PO CAPS Oral Take by mouth daily.      Marland Kitchen CITALOPRAM HYDROBROMIDE 20 MG PO TABS Oral Take 1 tablet (20 mg total) by mouth daily. 30 tablet 11  . CLOPIDOGREL BISULFATE 75 MG PO TABS Oral Take 75 mg by mouth daily.      . COLLAGENASE 250 UNIT/GM EX OINT Topical Apply 1 application topically 2 (two) times daily. To both legs.    Marland Kitchen DOXYCYCLINE HYCLATE  100 MG PO TABS Oral Take 100 mg by mouth 2 (two) times daily.    . FUROSEMIDE 20 MG PO TABS Oral Take 20 mg by mouth 2 (two) times daily.      Marland Kitchen GABAPENTIN 300 MG PO CAPS Oral Take 300 mg by mouth 2 (two) times daily.    . MOMETASONE FUROATE 50 MCG/ACT NA SUSP Nasal Place 2 sprays into the nose daily. 17 g 6  . MULTIVITAMINS PO TABS Oral Take 1 tablet by mouth daily.      Marland Kitchen NEXIUM 40 MG PO CPDR Oral Take 1 tablet by mouth Daily.    . OMEGA 3 1000 MG PO CAPS Oral Take 3 capsules by mouth daily.     Marland Kitchen PROBIOTIC FORMULA PO Oral Take 1 capsule by mouth daily.    Marland Kitchen SPIRONOLACTONE 25 MG PO TABS Oral Take 25 mg by mouth daily.     Marland Kitchen VITAMIN B-12 500 MCG PO TABS Oral Take 500 mcg by mouth 2 (two) times daily.       BP 152/87  Pulse 93  SpO2 94%  Physical Exam  Constitutional: She is oriented to person, place, and time. She appears well-developed and well-nourished.  HENT:  Head: Normocephalic.  Eyes: Pupils are equal, round, and reactive to light.  Neck:  Normal range of motion.  Cardiovascular: Normal rate.   Pulmonary/Chest: Effort normal.  Neurological: She is alert and oriented to person, place, and time.  Skin:       ED Course  Procedures (including critical care time)  Labs Reviewed - No data to display No results found.   No diagnosis found.  The site was reexamined approximately 35 minutes after sutures placed as to further bleeding.  Quick Clot was placed with  a dressing over   Will discharge patient home  MDM   I was unable to stop bleeding with pressure cuff applied to the thigh, nor digital pressure applied distal to the wound.  Dr. Sabra Heck was called to the bedside for assistance.  He anesthetized the area, and placed a figure-of-eight distal to the bleeding vein with resolution of bleeding.        Garald Balding, NP 05/13/12 [phone]

## 2012-05-14 ENCOUNTER — Emergency Department (HOSPITAL_COMMUNITY)
Admission: EM | Admit: 2012-05-14 | Discharge: 2012-05-15 | Disposition: A | Discharge status: Home / Self Care | Payer: BC Managed Care – PPO | Attending: Emergency Medicine | Admitting: Emergency Medicine

## 2012-05-14 DIAGNOSIS — S91009A Unspecified open wound, unspecified ankle, initial encounter: Secondary | ICD-10-CM | POA: Insufficient documentation

## 2012-05-14 DIAGNOSIS — W269XXA Contact with unspecified sharp object(s), initial encounter: Secondary | ICD-10-CM | POA: Insufficient documentation

## 2012-05-14 DIAGNOSIS — I10 Essential (primary) hypertension: Secondary | ICD-10-CM | POA: Insufficient documentation

## 2012-05-14 DIAGNOSIS — Z87898 Personal history of other specified conditions: Secondary | ICD-10-CM | POA: Insufficient documentation

## 2012-05-14 DIAGNOSIS — T148XXA Other injury of unspecified body region, initial encounter: Secondary | ICD-10-CM

## 2012-05-14 DIAGNOSIS — Z7982 Long term (current) use of aspirin: Secondary | ICD-10-CM | POA: Insufficient documentation

## 2012-05-14 DIAGNOSIS — S81009A Unspecified open wound, unspecified knee, initial encounter: Secondary | ICD-10-CM | POA: Insufficient documentation

## 2012-05-14 DIAGNOSIS — Z79899 Other long term (current) drug therapy: Secondary | ICD-10-CM | POA: Insufficient documentation

## 2012-05-14 DIAGNOSIS — Z7902 Long term (current) use of antithrombotics/antiplatelets: Secondary | ICD-10-CM | POA: Insufficient documentation

## 2012-05-14 MED ORDER — LIDOCAINE-EPINEPHRINE (PF) 1 %-1:200000 IJ SOLN
INTRAMUSCULAR | Status: AC
  Administered 2012-05-14
  Filled 2012-05-14: qty 10

## 2012-05-14 MED ORDER — BACITRACIN ZINC 500 UNIT/GM EX OINT
TOPICAL_OINTMENT | CUTANEOUS | Status: AC
  Filled 2012-05-14: qty 0.9

## 2012-05-14 NOTE — ED Notes (Signed)
EHM:[id number]<SJ> Expected date:<BR> Expected time: 8:53 PM<BR> Means of arrival:<BR> Comments:<BR> PTAR 36 - 60yoF Open wound seeping on ankle

## 2012-05-14 NOTE — ED Provider Notes (Signed)
History     CSN: [id number] Arrival date & time 05/14/12  2105 First MD Initiated Contact with Patient 05/14/12 2212     Chief Complaint  Patient presents with  . Wound Check   HPI The patient has history of scleroderma and recurrent skin ulcerations. She had a wound on her right inner ankle debris the last week and today it started bleeding. Patient thinks she lost maybe 30-40 cc of blood. Patient does take Plavix. Had similar symptoms like this in the past requiring visits to the emergency room. She had been seen recently for this because of bleeding in another area. In the emergency room in the placed a suture to ligate the vein. Patient states the bleeding seems to decrease when she is supine but as soon as she stands up the bleeding returns. Patient denies any numbness weakness or other complaints. Past Medical History  Diagnosis Date  . History of chicken pox   . Hypertension   . Sclerodermia generalized   . Non Hodgkin's lymphoma     Treated with chemo 2010, felt to be cured.  . Melanoma 1999  . Peripheral arterial occlusive disease   . Neuropathic foot ulcer   . DOE (dyspnea on exertion)     Past Surgical History  Procedure Date  . Rt little toe removal 10/1998  . Stent left thigh 3/11  . Tonsillectomy and adenoidectomy   . Small intestine surgery   . Biopsy on cerebellum     Family History  Problem Relation Age of Onset  . Lupus Father   . Lymphoma Father   . Hyperlipidemia Mother   . Cancer Daughter     sarcoma  . Colon cancer      History  Substance Use Topics  . Smoking status: Never Smoker   . Smokeless tobacco: Not on file  . Alcohol Use: Yes     couple of times a week    OB History    Grav Para Term Preterm Abortions TAB SAB Ect Mult Living                  Review of Systems  All other systems reviewed and are negative.    Allergies  Sulfa antibiotics and Penicillins  Home Medications   Current Outpatient Rx  Name Route Sig Dispense  Refill  . AMITRIPTYLINE HCL 25 MG PO TABS Oral Take 25 mg by mouth at bedtime.    . ASPIRIN 81 MG PO TABS Oral Take 81 mg by mouth daily.      . ATORVASTATIN CALCIUM 40 MG PO TABS Oral Take 1 tablet (40 mg total) by mouth daily. 30 tablet 11  . AZELASTINE HCL 137 MCG/SPRAY NA SOLN Nasal Place 2 sprays into the nose daily. Use in each nostril at bedtime    . BIOTIN PO Oral Take 1 tablet by mouth daily at 12 noon.     . BOSENTAN 62.5 MG PO TABS Oral Take 62.5 mg by mouth 2 (two) times daily.    Marland Kitchen VITAMIN D 2000 UNITS PO CAPS Oral Take by mouth daily.      Marland Kitchen CITALOPRAM HYDROBROMIDE 20 MG PO TABS Oral Take 1 tablet (20 mg total) by mouth daily. 30 tablet 11  . CLOPIDOGREL BISULFATE 75 MG PO TABS Oral Take 75 mg by mouth daily.      . COLLAGENASE 250 UNIT/GM EX OINT Topical Apply 1 application topically 2 (two) times daily. To both legs.    Marland Kitchen DOXYCYCLINE HYCLATE 100 MG  PO TABS Oral Take 100 mg by mouth 2 (two) times daily.    . FUROSEMIDE 20 MG PO TABS Oral Take 20 mg by mouth 2 (two) times daily.      Marland Kitchen GABAPENTIN 300 MG PO CAPS Oral Take 300 mg by mouth 2 (two) times daily.    . MOMETASONE FUROATE 50 MCG/ACT NA SUSP Nasal Place 2 sprays into the nose daily. 17 g 6  . MULTIVITAMINS PO TABS Oral Take 1 tablet by mouth daily.      Marland Kitchen NEXIUM 40 MG PO CPDR Oral Take 1 tablet by mouth Daily.    . OMEGA-3-ACID ETHYL ESTERS 1 G PO CAPS Oral Take 1 g by mouth See admin instructions. 1 g cap at noon and then 2 capsules at bedtime    . PROBIOTIC FORMULA PO Oral Take 1 capsule by mouth daily.    Marland Kitchen SPIRONOLACTONE 25 MG PO TABS Oral Take 25 mg by mouth daily.     Marland Kitchen VITAMIN B-12 500 MCG PO TABS Oral Take 500 mcg by mouth 2 (two) times daily.       BP 131/66  Pulse 93  Temp(Src) 98.1 F (36.7 C) (Oral)  Resp 18  SpO2 96%  Physical Exam  Nursing note and vitals reviewed. Constitutional: She appears well-developed and well-nourished. No distress.  HENT:  Head: Normocephalic and atraumatic.  Right Ear:  External ear normal.  Left Ear: External ear normal.  Eyes: Conjunctivae are normal. Right eye exhibits no discharge. Left eye exhibits no discharge. No scleral icterus.  Neck: Neck supple. No tracheal deviation present.  Cardiovascular: Normal rate.   Pulmonary/Chest: Effort normal. No stridor. No respiratory distress.  Musculoskeletal: She exhibits no edema.       Ulcerative lesion medial aspect of right ankle, small visible vessel at surface, when pt stood up the bleeding returned  Neurological: She is alert. Cranial nerve deficit: no gross deficits.  Skin: Skin is warm and dry.       Shiny thick skin  Psychiatric: She has a normal mood and affect.    ED Course  Procedures (including critical care time) Wound treatment Figure of eight sutures placed with 4-o vicryl to help ligate wound.  Gelfoam dressing placed with pressure dressing.  Labs Reviewed - No data to display No results found.   1. Bleeding from wound       MDM  A pressure dressing was placed following the suture ligation. The patient was monitored in the emergency room and she was able to and alert without any additional bleeding. The patient is a pressure dressing in place and she will followup with the wound care center as planned.        Kathalene Frames, MD 05/15/12 0000

## 2012-05-14 NOTE — Discharge Instructions (Signed)
Keep the dressing on the wound.  Follow up at the wound care center for removal of the dressing as planned.  Return as needed for worsening symptoms

## 2012-05-14 NOTE — ED Notes (Signed)
Pt has wound on her R inner ankle and had it debrided last week and states that it simply burst today. Lost approx 30-40cc per EMS. Pt is on blood thinners, plavix. Bleeding controlled at this time.

## 2012-05-16 ENCOUNTER — Encounter (HOSPITAL_COMMUNITY)
Admission: EM | Disposition: A | Discharge status: Home / Self Care | Payer: Self-pay | Source: Home / Self Care | Attending: Emergency Medicine

## 2012-05-16 ENCOUNTER — Emergency Department (HOSPITAL_COMMUNITY): Payer: BC Managed Care – PPO | Admitting: Anesthesiology

## 2012-05-16 ENCOUNTER — Other Ambulatory Visit: Payer: Self-pay | Admitting: Thoracic Diseases

## 2012-05-16 ENCOUNTER — Encounter (HOSPITAL_COMMUNITY): Payer: Self-pay | Admitting: Anesthesiology

## 2012-05-16 ENCOUNTER — Emergency Department (HOSPITAL_COMMUNITY)
Admission: EM | Admit: 2012-05-16 | Discharge: 2012-05-16 | Disposition: A | Discharge status: Home / Self Care | Payer: BC Managed Care – PPO | Attending: Emergency Medicine | Admitting: Emergency Medicine

## 2012-05-16 ENCOUNTER — Encounter (HOSPITAL_COMMUNITY): Payer: Self-pay

## 2012-05-16 DIAGNOSIS — IMO0002 Reserved for concepts with insufficient information to code with codable children: Secondary | ICD-10-CM | POA: Insufficient documentation

## 2012-05-16 DIAGNOSIS — M349 Systemic sclerosis, unspecified: Secondary | ICD-10-CM | POA: Insufficient documentation

## 2012-05-16 DIAGNOSIS — T148XXA Other injury of unspecified body region, initial encounter: Secondary | ICD-10-CM

## 2012-05-16 DIAGNOSIS — Y838 Other surgical procedures as the cause of abnormal reaction of the patient, or of later complication, without mention of misadventure at the time of the procedure: Secondary | ICD-10-CM | POA: Insufficient documentation

## 2012-05-16 DIAGNOSIS — I10 Essential (primary) hypertension: Secondary | ICD-10-CM | POA: Insufficient documentation

## 2012-05-16 DIAGNOSIS — Z85820 Personal history of malignant melanoma of skin: Secondary | ICD-10-CM | POA: Insufficient documentation

## 2012-05-16 DIAGNOSIS — I8390 Asymptomatic varicose veins of unspecified lower extremity: Secondary | ICD-10-CM

## 2012-05-16 DIAGNOSIS — I70209 Unspecified atherosclerosis of native arteries of extremities, unspecified extremity: Secondary | ICD-10-CM | POA: Insufficient documentation

## 2012-05-16 DIAGNOSIS — L97909 Non-pressure chronic ulcer of unspecified part of unspecified lower leg with unspecified severity: Secondary | ICD-10-CM | POA: Insufficient documentation

## 2012-05-16 DIAGNOSIS — I87319 Chronic venous hypertension (idiopathic) with ulcer of unspecified lower extremity: Secondary | ICD-10-CM | POA: Insufficient documentation

## 2012-05-16 DIAGNOSIS — C8589 Other specified types of non-Hodgkin lymphoma, extranodal and solid organ sites: Secondary | ICD-10-CM | POA: Insufficient documentation

## 2012-05-16 HISTORY — PX: VEIN LIGATION AND STRIPPING: SHX2653

## 2012-05-16 LAB — CBC
HCT: 38.7 % (ref 36.0–46.0)
Hemoglobin: 12.6 g/dL (ref 12.0–15.0)
MCH: 32.6 pg (ref 26.0–34.0)
MCHC: 32.6 g/dL (ref 30.0–36.0)
MCV: 100 fL (ref 78.0–100.0)
Platelets: 216 10*3/uL (ref 150–400)
RBC: 3.87 MIL/uL (ref 3.87–5.11)
RDW: 15.4 % (ref 11.5–15.5)
WBC: 7.1 10*3/uL (ref 4.0–10.5)

## 2012-05-16 LAB — BASIC METABOLIC PANEL
BUN: 24 mg/dL — ABNORMAL HIGH (ref 6–23)
CO2: 26 mEq/L (ref 19–32)
Calcium: 9.4 mg/dL (ref 8.4–10.5)
Chloride: 100 mEq/L (ref 96–112)
Creatinine, Ser: 0.83 mg/dL (ref 0.50–1.10)
GFR calc Af Amer: 86 mL/min — ABNORMAL LOW (ref >90)
GFR calc non Af Amer: 74 mL/min — ABNORMAL LOW (ref >90)
Glucose, Bld: 97 mg/dL (ref 70–99)
Potassium: 3.9 mEq/L (ref 3.5–5.1)
Sodium: 138 mEq/L (ref 135–145)

## 2012-05-16 SURGERY — LIGATION AND STRIPPING, VARICOSE VEIN
Anesthesia: Spinal | Site: Leg Lower | Laterality: Right | Wound class: Dirty or Infected

## 2012-05-16 MED ORDER — CEFAZOLIN SODIUM 1-5 GM-% IV SOLN
INTRAVENOUS | Status: AC
  Filled 2012-05-16: qty 50

## 2012-05-16 MED ORDER — ONDANSETRON HCL 4 MG/2ML IJ SOLN: 4.0000 mg | Freq: Once | INTRAMUSCULAR | Status: DC | PRN

## 2012-05-16 MED ORDER — FENTANYL CITRATE 0.05 MG/ML IJ SOLN
INTRAMUSCULAR | Status: DC | PRN
  Administered 2012-05-16: 100 ug via INTRAVENOUS

## 2012-05-16 MED ORDER — CEFAZOLIN SODIUM 1-5 GM-% IV SOLN
1.0000 g | INTRAVENOUS | Status: AC
  Administered 2012-05-16: 1 g via INTRAVENOUS

## 2012-05-16 MED ORDER — PHENYLEPHRINE HCL 10 MG/ML IJ SOLN
20.0000 mg | INTRAVENOUS | Status: DC | PRN
  Administered 2012-05-16: 100 ug/min via INTRAVENOUS

## 2012-05-16 MED ORDER — FLUMAZENIL 0.5 MG/5ML IV SOLN
INTRAVENOUS | Status: DC | PRN
  Administered 2012-05-16: 0.3 mg via INTRAVENOUS

## 2012-05-16 MED ORDER — MIDAZOLAM HCL 5 MG/5ML IJ SOLN
INTRAMUSCULAR | Status: DC | PRN
  Administered 2012-05-16 (×2): 2 mg via INTRAVENOUS

## 2012-05-16 MED ORDER — LACTATED RINGERS IV SOLN
INTRAVENOUS | Status: DC | PRN
  Administered 2012-05-16 (×2): via INTRAVENOUS

## 2012-05-16 MED ORDER — HYDROMORPHONE HCL PF 1 MG/ML IJ SOLN: 0.2500 mg | INTRAMUSCULAR | Status: DC | PRN

## 2012-05-16 MED ORDER — PHENYLEPHRINE HCL 10 MG/ML IJ SOLN
INTRAMUSCULAR | Status: DC | PRN
  Administered 2012-05-16 (×4): 120 ug via INTRAVENOUS

## 2012-05-16 MED ORDER — OXYCODONE-ACETAMINOPHEN 5-325 MG PO TABS: 1.0000 | ORAL_TABLET | ORAL | Status: AC | PRN

## 2012-05-16 MED ORDER — SUCCINYLCHOLINE CHLORIDE 20 MG/ML IJ SOLN
INTRAMUSCULAR | Status: DC | PRN
  Administered 2012-05-16: 80 mg via INTRAVENOUS

## 2012-05-16 MED ORDER — ALBUMIN HUMAN 5 % IV SOLN
INTRAVENOUS | Status: DC | PRN
  Administered 2012-05-16 (×2): via INTRAVENOUS

## 2012-05-16 MED ORDER — PROPOFOL 10 MG/ML IV EMUL
INTRAVENOUS | Status: DC | PRN
  Administered 2012-05-16: 50 mg via INTRAVENOUS
  Administered 2012-05-16: 150 mg via INTRAVENOUS

## 2012-05-16 MED ORDER — LACTATED RINGERS IV SOLN
INTRAVENOUS | Status: DC
  Administered 2012-05-16: 15:00:00 via INTRAVENOUS

## 2012-05-16 SURGICAL SUPPLY — 45 items
BAG ISOLATION DRAPE 18X18 (DRAPES) IMPLANT
BANDAGE GAUZE ELAST BULKY 4 IN (GAUZE/BANDAGES/DRESSINGS) ×2 IMPLANT
BENZOIN TINCTURE PRP APPL 2/3 (GAUZE/BANDAGES/DRESSINGS) IMPLANT
BLADE SURG 11 STRL SS (BLADE) ×2 IMPLANT
BLADE SURG 15 STRL LF DISP TIS (BLADE) IMPLANT
BLADE SURG 15 STRL SS (BLADE)
BNDG COHESIVE 4X5 TAN STRL (GAUZE/BANDAGES/DRESSINGS) ×2 IMPLANT
BNDG COHESIVE 6X5 TAN STRL LF (GAUZE/BANDAGES/DRESSINGS) IMPLANT
CANISTER SUCTION 2500CC (MISCELLANEOUS) ×2 IMPLANT
CLIP LIGATING EXTRA MED SLVR (CLIP) IMPLANT
CLIP LIGATING EXTRA SM BLUE (MISCELLANEOUS) IMPLANT
CLOTH BEACON ORANGE TIMEOUT ST (SAFETY) ×2 IMPLANT
COVER SURGICAL LIGHT HANDLE (MISCELLANEOUS) ×4 IMPLANT
DRAPE INCISE IOBAN 66X45 STRL (DRAPES) IMPLANT
DRAPE ISOLATION BAG 18X18 (DRAPES)
DRSG COVADERM 4X8 (GAUZE/BANDAGES/DRESSINGS) IMPLANT
ELECT REM PT RETURN 9FT ADLT (ELECTROSURGICAL) ×2
ELECTRODE REM PT RTRN 9FT ADLT (ELECTROSURGICAL) ×1 IMPLANT
GLOVE SS BIOGEL STRL SZ 7.5 (GLOVE) ×1 IMPLANT
GLOVE SUPERSENSE BIOGEL SZ 7.5 (GLOVE) ×1
GOWN STRL NON-REIN LRG LVL3 (GOWN DISPOSABLE) ×6 IMPLANT
KIT BASIN OR (CUSTOM PROCEDURE TRAY) ×2 IMPLANT
KIT ROOM TURNOVER OR (KITS) ×2 IMPLANT
NS IRRIG 1000ML POUR BTL (IV SOLUTION) ×2 IMPLANT
PACK GENERAL/GYN (CUSTOM PROCEDURE TRAY) ×2 IMPLANT
PACK UNIVERSAL I (CUSTOM PROCEDURE TRAY) ×2 IMPLANT
PAD ARMBOARD 7.5X6 YLW CONV (MISCELLANEOUS) ×4 IMPLANT
SPECIMEN JAR SMALL (MISCELLANEOUS) IMPLANT
SPONGE GAUZE 4X4 12PLY (GAUZE/BANDAGES/DRESSINGS) ×2 IMPLANT
STRIP CLOSURE SKIN 1/2X4 (GAUZE/BANDAGES/DRESSINGS) IMPLANT
SUT SILK 2 0 (SUTURE) ×1
SUT SILK 2 0 SH (SUTURE) IMPLANT
SUT SILK 2-0 18XBRD TIE 12 (SUTURE) ×1 IMPLANT
SUT SILK 3 0 (SUTURE)
SUT SILK 3-0 18XBRD TIE 12 (SUTURE) IMPLANT
SUT VIC AB 3-0 SH 27 (SUTURE) ×1
SUT VIC AB 3-0 SH 27X BRD (SUTURE) ×1 IMPLANT
SUT VIC AB 3-0 SH 8-18 (SUTURE) IMPLANT
SUT VICRYL 4-0 PS2 18IN ABS (SUTURE) ×4 IMPLANT
SUT VICRYL AB 3 0 TIES (SUTURE) ×2 IMPLANT
TOWEL OR 17X24 6PK STRL BLUE (TOWEL DISPOSABLE) ×4 IMPLANT
TOWEL OR 17X26 10 PK STRL BLUE (TOWEL DISPOSABLE) ×4 IMPLANT
UNDERPAD 30X30 INCONTINENT (UNDERPADS AND DIAPERS) ×2 IMPLANT
VEIN STRIPPER DISP (MISCELLANEOUS) ×2 IMPLANT
WATER STERILE IRR 1000ML POUR (IV SOLUTION) ×2 IMPLANT

## 2012-05-16 NOTE — ED Notes (Signed)
Called or and spoke with dr Scot Dock scrub nurse (relay) and asked about possibly getting a bed upstairs for pt. md advises pt is not being admitted today. He hopes to be able to get pt procedure done before the emergency case arrives from other facility. Pt and family aware.

## 2012-05-16 NOTE — ED Notes (Signed)
MD at bedside. 

## 2012-05-16 NOTE — ED Notes (Signed)
Vascular Surgeon at bedside.

## 2012-05-16 NOTE — ED Notes (Signed)
To ED via EMS from home for eval of complications of vascular procedure done on Monday 5/13. Initial procedure done 4wks ago, where a vein was scraped and repaired. On Monday the vein in RLE burst and Dr Tempie Hoist repaired it with suture, upon exiting bed this morning the vein ruptured and bled on the floor. EMS was able to control bleeding. At arrival gauze applied and original pressure dressing. Per EMS pt 110/68, 88p. No distress noted.

## 2012-05-16 NOTE — Anesthesia Preprocedure Evaluation (Signed)
Anesthesia Evaluation  Patient identified by MRN, date of birth, ID band Patient awake    Reviewed: Allergy & Precautions, H&P , NPO status , Patient's Chart, lab work & pertinent test results  Airway Mallampati: II TM Distance: >3 FB     Dental  (+) Teeth Intact   Pulmonary  breath sounds clear to auscultation        Cardiovascular Rhythm:Regular Rate:Normal     Neuro/Psych    GI/Hepatic   Endo/Other    Renal/GU      Musculoskeletal   Abdominal   Peds  Hematology   Anesthesia Other Findings   Reproductive/Obstetrics                           Anesthesia Physical Anesthesia Plan  ASA: III  Anesthesia Plan: Spinal   Post-op Pain Management:    Induction:   Airway Management Planned: Nasal Cannula  Additional Equipment:   Intra-op Plan:   Post-operative Plan:   Informed Consent: I have reviewed the patients History and Physical, chart, labs and discussed the procedure including the risks, benefits and alternatives for the proposed anesthesia with the patient or authorized representative who has indicated his/her understanding and acceptance.   Dental advisory given  Plan Discussed with:   Anesthesia Plan Comments: (Plan SAB Pulmonary htn a concern.  Roberts Gaudy, MD)        Anesthesia Quick Evaluation

## 2012-05-16 NOTE — ED Notes (Signed)
Report givent to pam

## 2012-05-16 NOTE — ED Notes (Signed)
Pt does have covered wound from debridement above the one that is bleeding to the mid tibial and also has one on her left shin mid tibial, and left medial ankle.

## 2012-05-16 NOTE — Op Note (Signed)
OPERATIVE REPORT  DATE OF SURGERY: 05/16/2012  PATIENT: [name] [name], 61 y.o. female MRN: [id number]  DOB: 06/23/1951  PRE-OPERATIVE DIAGNOSIS: Bleeding from disrupted necrotic right great saphenous vein at the medial malleolus  POST-OPERATIVE DIAGNOSIS:  Same  PROCEDURE: Debridement of medial ankle wound and ligation proximally and distally of exposed great saphenous vein  SURGEON:  [name], M.D.   ASSISTANT: Nurse  ANESTHESIA:  LMA  EBL: 50 ml     BLOOD ADMINISTERED: None  DRAINS: None  SPECIMEN: None  COUNTS CORRECT:  YES  PLAN OF CARE: PACU   PATIENT DISPOSITION:  PACU - hemodynamically stable  PROCEDURE DETAILS: Indication for procedure: The patient has severe venous hypertension and venous ulceration over her right leg she has had several episodes of bleeding requiring several instances of presenting to the emergency room for control. She presents again today with significant bleeding. On physical exam she has disruption of the anterior wall of her great saphenous vein at the base of this necrotic ulcer. It is recommended that she get [name] the operating room for repair of this area for local control and consideration for eventual treatment for venous hypertension.  Procedure in detail: The patient was taken to the operating room placed supine position where the area of the right leg was prepped and draped in usual sterile fashion. There was an area pretibial where she had suture in several weeks ago and the suture was removed and there was no evidence of bleeding. This was not over the saphenous vein. Attention was then directed to the area just at the level of the medial malleolus where she had an ulcer that extended down into the anterior wall of the great saphenous vein. On removing the dressing and I clinically necessary she had significant amount of bleeding from this. The patient was placed in Trendelenburg and this was easily controlled with pressure above the level  of the ulcer. The necrotic tissue was debrided and and this essentially went down into [name] large great saphenous vein which was necrotic. This was controlled this was [name] placement of mattress sutures to control the vein. The difficulty was she did have [name] great deal of ulceration and very friable tissue around this due to the venous ulcer. 3-0 Prolene suture in [name] horizontal mattress fashion was placed proximal and distal to the opening to control the bleeding. This was successful. The wounds were irrigated further debrided and [name] hydrogel was placed over the open areas. 4 x 4 was placed over this and [name] Curlex and Coban pressure dressing was applied. The patient tolerated procedure without immediate complication was transferred to the recovery room. She will be seen in my office in 3 days for formal venous study. It should be noted that I did reimage her great saphenous vein with SonoSite ultrasound in the operating room and she had obvious gross reflux in her great saphenous vein. It is indeterminate what her deep system anatomy is. This will be evaluated in our office.   [name], M.D. 05/16/2012 4:52 PM

## 2012-05-16 NOTE — Transfer of Care (Signed)
Immediate Anesthesia Transfer of Care Note  Patient: [name] [name]  Procedure(s) Performed: Procedure(s) (LRB): VEIN LIGATION AND STRIPPING (Right)  Patient Location: PACU  Anesthesia Type: General  Level of Consciousness: awake, alert , oriented and patient cooperative  Airway & Oxygen Therapy: Patient Spontanous Breathing and Patient connected to nasal cannula oxygen  Post-op Assessment: Report given to PACU RN, Post -op Vital signs reviewed and stable and Patient moving all extremities X 4  Post vital signs: Reviewed and stable  Complications: No apparent anesthesia complications

## 2012-05-16 NOTE — H&P (Signed)
[facility] [place]  Patient name: [name] [name] MRN: [id number] DOB: 1951/07/05 Sex: female  REASON FOR CONSULT: bleeding from greater saphenous vein right leg  HPI: [name] [name] is a 61 y.o. female with a history of scleroderma. She has had extensive wounds of both lower extremities. These are followed closely in the wound care center. In addition she has undergone previous endovascular revascularization of both lower extremities according to the patient. This was done in [place] and she continues to follow up with her vascular surgeon in [place]. She has presented now with a third episode of bleeding from the greater saphenous vein of the distal right leg. Most recently 2 days ago this was sutured however she returns again with bleeding from this. She is unaware of any bleeding or clotting disorders.  Past Medical History  Diagnosis Date  . History of chicken pox   . Hypertension   . Sclerodermia generalized   . Non Hodgkin's lymphoma     Treated with chemo 2010, felt to be cured.  . Melanoma 1999  . Peripheral arterial occlusive disease   . Neuropathic foot ulcer   . DOE (dyspnea on exertion)     Family History  Problem Relation Age of Onset  . Lupus Father   . Lymphoma Father   . Hyperlipidemia Mother   . Cancer Daughter     sarcoma  . Colon cancer      SOCIAL HISTORY: History  Substance Use Topics  . Smoking status: Never Smoker   . Smokeless tobacco: Not on file  . Alcohol Use: Yes     couple of times a week    Allergies  Allergen Reactions  . Sulfa Antibiotics     Severe rash with significant peeling of face. No airway involvement per patient  . Penicillins Rash    Current Facility-Administered Medications  Medication Dose Route Frequency Provider Last Rate Last Dose  . ceFAZolin (ANCEF) IVPB 1 g/50 mL premix  1 g Intravenous On Call Angelia Mould, MD      . lactated ringers infusion   Intravenous Continuous Finis Bud, MD 50 mL/hr at 05/16/12 1430      REVIEW OF SYSTEMS: Valu.Nieves ] denotes positive finding; [  ] denotes negative finding CARDIOVASCULAR:  _0  chest pain   _1  chest pressure   _2  palpitations   _3  orthopnea   _4  dyspnea on exertion   _5  claudication   _6  rest pain   _7  DVT   _8  phlebitis PULMONARY:   _9  productive cough   _10  asthma   _11  wheezing NEUROLOGIC:   _12  weakness  _13  paresthesias  _14  aphasia  _15  amaurosis  _16  dizziness HEMATOLOGIC:   _17  bleeding problems   _18  clotting disorders MUSCULOSKELETAL:  _19  joint pain   _20  joint swelling _21  leg swelling GASTROINTESTINAL: _22   blood in stool  _23   hematemesis GENITOURINARY:  _24   dysuria  _25   hematuria PSYCHIATRIC:  _26  history of major depression INTEGUMENTARY:  _27  rashes  _28  ulcers CONSTITUTIONAL:  _29  fever   _30  chills  PHYSICAL EXAM: Filed Vitals:   05/16/12 1047 05/16/12 1200 05/16/12 1307 05/16/12 1359  BP: 106/57 102/56 107/66 109/53  Pulse: 88 79 84 83  Temp:      TempSrc:      Resp: 18  20  SpO2: 97% 98% 99% 100%   There is no height or weight on file to calculate BMI. GENERAL: The patient is a well-nourished female, in no acute distress. The vital signs are documented above. CARDIOVASCULAR: There is a regular rate and rhythm without significant murmur appreciated.she has palpable femoral and popliteal pulses bilaterally. Both feet are warm and well perfused although I cannot palpate pulses. She does have brisk anterior tibial dorsalis pedis and posterior tibial signals bilaterally.  PULMONARY: There is good air exchange bilaterally without wheezing or rales. ABDOMEN: Soft and non-tender with normal pitched bowel sounds.  MUSCULOSKELETAL: There are no major deformities or cyanosis. NEUROLOGIC: No focal weakness or paresthesias are detected. SKIN: She has multiple wounds in both lower extremities. As above the ankle on the right there is exposed saphenous vein which is the area that has been bleeding.  There is a wound on the anterior lateral aspect of the right leg which also had apparently bled recently also. She has an extensive wound of her left medial malleolus. PSYCHIATRIC: The patient has a normal affect.  DATA:  Lab Results  Component Value Date   WBC 7.1 05/16/2012   HGB 12.6 05/16/2012   HCT 38.7 05/16/2012   MCV 100.0 05/16/2012   PLT 216 05/16/2012   Lab Results  Component Value Date   NA 138 05/16/2012   K 3.9 05/16/2012   CL 100 05/16/2012   CO2 26 05/16/2012   Lab Results  Component Value Date   CREATININE 0.83 05/16/2012   Lab Results  Component Value Date   INR 1.1* 04/10/2012   No results found for this basename: HGBA1C   CBG (last 3)  No results found for this basename: GLUCAP:3 in the last 72 hours  MEDICAL ISSUES: Given the recurrent episodes of bleeding from the right leg wound I have recommended exploration in the operating room with ligation of the right greater saphenous vein. Dr. [name] will proceed with surgery as another emergency has. The procedure and risks were discussed with the patient.   [facility] [place] Beeper: [phone]

## 2012-05-16 NOTE — ED Notes (Signed)
Lab at bedside

## 2012-05-16 NOTE — ED Provider Notes (Signed)
History     CSN: [id number]  Arrival date & time 05/16/12  7322   First MD Initiated Contact with Patient 05/16/12 [phone]      Chief Complaint  Patient presents with  . Coagulation Disorder    Vascular surgery on BLE with bleeding     HPI Patient presents to the emergency room with complaints of recurrent bleeding from her left lower extremity.  If she seen this patient twice before for the same problem. Most recently I saw her on Monday. Patient had a procedure done about 4 weeks ago of wound debridement around her right medial lower extremity. Patient has history of scleroderma and recurrent skin ulcerations. She has frequent visits to the wound care center for treatment of these wounds. Since this most recent procedure in that area patient has had several episodes of recurrent bleeding.  She actually had an area of bleeding in a different location requiring a visit to the ED which was treated with figure-of-eight suturing of a vessel. Patient was seen by me on Monday when she had a noted area that started bleeding. The family had noticed that the vein in that area had become more swollen after the suturing of the other areas of bleeding. On Monday in the emergency room she was noted to have brisk bleeding from a visible vein at the base of the wound.  I was able to treat the bleeding vessel with superficial suturing and Gelfoam dressing. Patient states yesterday there was no additional bleeding but this morning she woke up and all started having brisk bleeding from the ankle again. When she is supine the bleeding diminishes as soon as she tries to stand it becomes very brisk again. She denies any other symptoms. Past Medical History  Diagnosis Date  . History of chicken pox   . Hypertension   . Sclerodermia generalized   . Non Hodgkin's lymphoma     Treated with chemo 2010, felt to be cured.  . Melanoma 1999  . Peripheral arterial occlusive disease   . Neuropathic foot ulcer   . DOE  (dyspnea on exertion)     Past Surgical History  Procedure Date  . Rt little toe removal 10/1998  . Stent left thigh 3/11  . Tonsillectomy and adenoidectomy   . Small intestine surgery   . Biopsy on cerebellum     Family History  Problem Relation Age of Onset  . Lupus Father   . Lymphoma Father   . Hyperlipidemia Mother   . Cancer Daughter     sarcoma  . Colon cancer      History  Substance Use Topics  . Smoking status: Never Smoker   . Smokeless tobacco: Not on file  . Alcohol Use: Yes     couple of times a week    OB History    Grav Para Term Preterm Abortions TAB SAB Ect Mult Living                  Review of Systems  All other systems reviewed and are negative.    Allergies  Sulfa antibiotics and Penicillins  Home Medications   Current Outpatient Rx  Name Route Sig Dispense Refill  . BOSENTAN 62.5 MG PO TABS Oral Take 62.5 mg by mouth 2 (two) times daily.    Marland Kitchen AMITRIPTYLINE HCL 25 MG PO TABS Oral Take 25 mg by mouth at bedtime.    . ASPIRIN 81 MG PO TABS Oral Take 81 mg by mouth daily.      Marland Kitchen  AZELASTINE HCL 137 MCG/SPRAY NA SOLN Nasal Place 2 sprays into the nose daily. Use in each nostril at bedtime    . BIOTIN PO Oral Take 1 tablet by mouth daily at 12 noon.     Marland Kitchen VITAMIN D 2000 UNITS PO CAPS Oral Take by mouth daily.      Marland Kitchen CITALOPRAM HYDROBROMIDE 20 MG PO TABS Oral Take 1 tablet (20 mg total) by mouth daily. 30 tablet 11  . CLOPIDOGREL BISULFATE 75 MG PO TABS Oral Take 75 mg by mouth daily.      . COLLAGENASE 250 UNIT/GM EX OINT Topical Apply 1 application topically 2 (two) times daily. To both legs.    Marland Kitchen DOXYCYCLINE HYCLATE 100 MG PO TABS Oral Take 100 mg by mouth 2 (two) times daily.    . FUROSEMIDE 20 MG PO TABS Oral Take 20 mg by mouth 2 (two) times daily.      Marland Kitchen GABAPENTIN 300 MG PO CAPS Oral Take 300 mg by mouth 2 (two) times daily.    . MOMETASONE FUROATE 50 MCG/ACT NA SUSP Nasal Place 2 sprays into the nose daily. 17 g 6  . MULTIVITAMINS PO  TABS Oral Take 1 tablet by mouth daily.      Marland Kitchen NEXIUM 40 MG PO CPDR Oral Take 1 tablet by mouth Daily.    . OMEGA-3-ACID ETHYL ESTERS 1 G PO CAPS Oral Take 1 g by mouth See admin instructions. 1 g cap at noon and then 2 capsules at bedtime    . PROBIOTIC FORMULA PO Oral Take 1 capsule by mouth daily.    Marland Kitchen SPIRONOLACTONE 25 MG PO TABS Oral Take 25 mg by mouth daily.     Marland Kitchen VITAMIN B-12 500 MCG PO TABS Oral Take 500 mcg by mouth 2 (two) times daily.       BP 113/56  Pulse 80  Temp(Src) 97.7 F (36.5 C) (Oral)  Resp 18  Physical Exam  Nursing note and vitals reviewed. Constitutional: She appears well-developed and well-nourished. No distress.  HENT:  Head: Normocephalic and atraumatic.  Right Ear: External ear normal.  Left Ear: External ear normal.  Eyes: Conjunctivae are normal. Right eye exhibits no discharge. Left eye exhibits no discharge. No scleral icterus.  Neck: Neck supple. No tracheal deviation present.  Cardiovascular: Normal rate.   Pulmonary/Chest: Effort normal. No stridor. No respiratory distress.  Musculoskeletal: She exhibits no edema.       Right medial ankle with skin ulcer, at the base of this ulcer is a visible vein with a slight bulge, at rest there is no active bleeding at this time  Neurological: She is alert. Cranial nerve deficit: no gross deficits.  Skin: Skin is warm and dry. No rash noted.       Skin changes associated with scleroderma  Psychiatric: She has a normal mood and affect.    ED Course  Procedures (including critical care time)  Labs Reviewed  BASIC METABOLIC PANEL - Abnormal; Notable for the following:    BUN 24 (*)    GFR calc non Af Amer 74 (*)    GFR calc Af Amer 86 (*)    All other components within normal limits  CBC   No results found.    MDM  I suspect that this bleeding may be coming from the patient's saphenous vein or or a branch thereof. The last time I saw her I attempted to close the skin around the wound however  because of her scleroderma really is  no tissue that I am able to bring up over the vessel itself. Concern that she may need either a vascular repair possible skin grafting to cover this area. I will consult the vascular surgeon on call to get their opinion.  9:41 AM Dr. Scot Dock is in the OR.  He will come to evaluate the patient.        Kathalene Frames, MD 05/16/12 [phone]

## 2012-05-16 NOTE — ED Notes (Signed)
Family at bedside. 

## 2012-05-16 NOTE — Preoperative (Signed)
Beta Blockers   Reason not to administer Beta Blockers:Not Applicable

## 2012-05-16 NOTE — Discharge Instructions (Signed)
Keep dressing on until seen by Dr.Early next Tuesday - 05/20/12

## 2012-05-16 NOTE — Anesthesia Postprocedure Evaluation (Signed)
  Anesthesia Post-op Note  Patient: [name] [name]  Procedure(s) Performed: Procedure(s) (LRB): VEIN LIGATION AND STRIPPING (Right)  Patient Location: PACU  Anesthesia Type: General  Level of Consciousness: awake, alert  and oriented  Airway and Oxygen Therapy: Patient Spontanous Breathing and Patient connected to nasal cannula oxygen  Post-op Pain: mild  Post-op Assessment: Post-op Vital signs reviewed and Patient's Cardiovascular Status Stable  Post-op Vital Signs: stable  Complications: No apparent anesthesia complications

## 2012-05-19 ENCOUNTER — Encounter (HOSPITAL_COMMUNITY): Payer: Self-pay | Admitting: Vascular Surgery

## 2012-05-19 ENCOUNTER — Encounter: Payer: Self-pay | Admitting: Vascular Surgery

## 2012-05-20 ENCOUNTER — Encounter (INDEPENDENT_AMBULATORY_CARE_PROVIDER_SITE_OTHER): Payer: BC Managed Care – PPO | Admitting: *Deleted

## 2012-05-20 ENCOUNTER — Ambulatory Visit (INDEPENDENT_AMBULATORY_CARE_PROVIDER_SITE_OTHER): Payer: BC Managed Care – PPO | Admitting: Vascular Surgery

## 2012-05-20 ENCOUNTER — Encounter: Payer: Self-pay | Admitting: Vascular Surgery

## 2012-05-20 VITALS — BP 105/63 | HR 85 | Temp 97.7°F | Resp 20 | Ht 68.0 in | Wt 150.0 lb

## 2012-05-20 DIAGNOSIS — I8390 Asymptomatic varicose veins of unspecified lower extremity: Secondary | ICD-10-CM

## 2012-05-20 DIAGNOSIS — I83893 Varicose veins of bilateral lower extremities with other complications: Secondary | ICD-10-CM | POA: Insufficient documentation

## 2012-05-20 DIAGNOSIS — I872 Venous insufficiency (chronic) (peripheral): Secondary | ICD-10-CM

## 2012-05-20 NOTE — Progress Notes (Signed)
The patient presents today for followup of the emergent surgery on 05/16/2002. She presented to the emergency department with ongoing bleeding from her right ankle. She has an extensive past history of venous stasis disease and also extensive past history of peripheral vascular occlusive disease. She does have severe scleroderma as well. She had an ulcer that had eroded into the anterior wall of her right great saphenous vein at her medial malleolus with complete disruption of the anterior wall and major bleeding. She was taken emergently to the operating room where she underwent repair of this. I did do a SonoSite ultrasound in the operating room and this did show gross reflux in her saphenous vein. She is here today for followup and also for formal duplex of her right leg. She had a pressure dressing placed a time and has had no further bleeding.  Past Medical History  Diagnosis Date  . History of chicken pox   . Hypertension   . Sclerodermia generalized   . Non Hodgkin's lymphoma     Treated with chemo 2010, felt to be cured.  . Melanoma 1999  . Peripheral arterial occlusive disease   . Neuropathic foot ulcer   . DOE (dyspnea on exertion)     History  Substance Use Topics  . Smoking status: Never Smoker   . Smokeless tobacco: Never Used  . Alcohol Use: Yes     couple of times a week    Family History  Problem Relation Age of Onset  . Lupus Father   . Lymphoma Father   . Hyperlipidemia Mother   . Cancer Daughter     sarcoma  . Colon cancer      Allergies  Allergen Reactions  . Sulfa Antibiotics     Severe rash with significant peeling of face. No airway involvement per patient  . Penicillins Rash    Current outpatient prescriptions:amitriptyline (ELAVIL) 25 MG tablet, Take 25 mg by mouth at bedtime., Disp: , Rfl: ;  aspirin 81 MG tablet, Take 81 mg by mouth daily.  , Disp: , Rfl: ;  azelastine (ASTELIN) 137 MCG/SPRAY nasal spray, Place 2 sprays into the nose daily. Use in  each nostril at bedtime, Disp: , Rfl: ;  BIOTIN PO, Take 1 tablet by mouth daily at 12 noon. , Disp: , Rfl:  bosentan (TRACLEER) 62.5 MG tablet, Take 62.5 mg by mouth 2 (two) times daily., Disp: , Rfl: ;  Cholecalciferol (VITAMIN D) 2000 UNITS CAPS, Take by mouth daily.  , Disp: , Rfl: ;  citalopram (CELEXA) 20 MG tablet, Take 1 tablet (20 mg total) by mouth daily., Disp: 30 tablet, Rfl: 11;  clopidogrel (PLAVIX) 75 MG tablet, Take 75 mg by mouth daily.  , Disp: , Rfl:  collagenase (SANTYL) ointment, Apply 1 application topically 2 (two) times daily. To both legs., Disp: , Rfl: ;  doxycycline (VIBRA-TABS) 100 MG tablet, Take 100 mg by mouth 2 (two) times daily., Disp: , Rfl: ;  furosemide (LASIX) 20 MG tablet, Take 20 mg by mouth 2 (two) times daily.  , Disp: , Rfl: ;  gabapentin (NEURONTIN) 300 MG capsule, Take 300 mg by mouth 2 (two) times daily., Disp: , Rfl:  mometasone (NASONEX) 50 MCG/ACT nasal spray, Place 2 sprays into the nose daily., Disp: 17 g, Rfl: 6;  multivitamin (THERAGRAN) per tablet, Take 1 tablet by mouth daily.  , Disp: , Rfl: ;  NEXIUM 40 MG capsule, Take 1 tablet by mouth Daily., Disp: , Rfl: ;  omega-3 acid  ethyl esters (LOVAZA) 1 G capsule, Take 1 g by mouth See admin instructions. 1 g cap at noon and then 2 capsules at bedtime, Disp: , Rfl:  oxyCODONE-acetaminophen (ROXICET) 5-325 MG per tablet, Take 1 tablet by mouth every 4 (four) hours as needed for pain., Disp: 30 tablet, Rfl: 0;  Probiotic Product (PROBIOTIC FORMULA PO), Take 1 capsule by mouth daily., Disp: , Rfl: ;  spironolactone (ALDACTONE) 25 MG tablet, Take 25 mg by mouth daily. , Disp: , Rfl: ;  vitamin B-12 (CYANOCOBALAMIN) 500 MCG tablet, Take 500 mcg by mouth 2 (two) times daily. , Disp: , Rfl:   BP 105/63  Pulse 85  Temp(Src) 97.7 F (36.5 C) (Oral)  Resp 20  Ht _0  (1.727 m)  Wt 150 lb (68.04 kg)  BMI 22.81 kg/m2  Body mass index is 22.81 kg/(m^2).       Physical exam well-developed white female no  acute distress. She did have her Kopan dressing removed from her right leg she also had the dressing removed from her left leg where she has multiple open venous ulcers. She had not been using compression but had been using and spermatic debridement or ulcers. She is being followed at the wound center. She does not have any further bleeding with sutures intact at the level of the ankle. The ulcerations have a significant amount of necrotic debris in the base of the right and left leg. Your leg does look better after 3 days of Kopan compression  Venous duplex of her right leg with no gross reflux in her deep and superficial system with dilated saphenous vein from the saphenofemoral junction down to the area of the wrap.  Impression and plan severe venous stasis disease bilaterally. We have switched her to Silvadene and compression. I explained the critical importance of compression for healing of these wounds. She will followup with me in several weeks to assure that this is healing and also for removal of the sutures at her saphenous vein ligation site. She will continue to followup at the wound center for her chronic wound

## 2012-05-21 NOTE — Telephone Encounter (Signed)
Pt's Tracleer was started about a week ago, she is asking if she should be started on Revatio or not, will ask Dr [name] and get back to her tomorrow

## 2012-05-22 ENCOUNTER — Encounter (HOSPITAL_COMMUNITY): Payer: Self-pay

## 2012-05-22 NOTE — Telephone Encounter (Signed)
Discussed w/Dr Bensimhon he would like for pt to be on Tracleer for a good month before Revatio was added, pt is aware, will go ahead and start paperwork next week since it will take time to get med

## 2012-05-23 NOTE — Procedures (Unsigned)
LOWER EXTREMITY VENOUS REFLUX EXAM  INDICATION:  Right varicose vein ligation with venous hypertension and chronic insufficiency  EXAM:  Using color-flow imaging and pulse Doppler spectral analysis, the right common femoral, femoral, popliteal, posterior tibial, great and small saphenous veins were evaluated.  There is evidence suggesting deep venous insufficiency in the right lower extremity.  The right saphenofemoral junction is not competent with reflux of >500 milliseconds.  The right GSV is not competent with reflux of >500 milliseconds with the caliber as described below.   The right proximal small saphenous vein demonstrates competency.  GSV Diameter (used if found to be incompetent only)                                           Right    Left Proximal Greater Saphenous Vein           1.15 cm  cm Proximal-to-mid-thigh                     0.73 cm  cm Mid thigh                                 0.68 cm  cm Mid-distal thigh                          cm       cm Distal thigh                              0.64 cm  cm Knee                                      0.54 cm  cm  IMPRESSION: 1. The right great saphenous vein is not competent with reflux of >500     milliseconds. 2. The right great saphenous vein is not tortuous. 3. The deep venous system is not competent with reflux of >500     milliseconds. 4. The right small saphenous vein is competent.  ___________________________________________ [name], M.D.  EM/MEDQ  D:  05/20/2012  T:  05/20/2012  Job:  [id number]

## 2012-06-02 ENCOUNTER — Encounter (HOSPITAL_BASED_OUTPATIENT_CLINIC_OR_DEPARTMENT_OTHER): Payer: BC Managed Care – PPO | Attending: Internal Medicine

## 2012-06-02 ENCOUNTER — Encounter: Payer: Self-pay | Admitting: Vascular Surgery

## 2012-06-02 DIAGNOSIS — M349 Systemic sclerosis, unspecified: Secondary | ICD-10-CM | POA: Insufficient documentation

## 2012-06-02 DIAGNOSIS — I87319 Chronic venous hypertension (idiopathic) with ulcer of unspecified lower extremity: Secondary | ICD-10-CM | POA: Insufficient documentation

## 2012-06-02 DIAGNOSIS — Z79899 Other long term (current) drug therapy: Secondary | ICD-10-CM | POA: Insufficient documentation

## 2012-06-02 DIAGNOSIS — L97309 Non-pressure chronic ulcer of unspecified ankle with unspecified severity: Secondary | ICD-10-CM | POA: Insufficient documentation

## 2012-06-02 DIAGNOSIS — L97909 Non-pressure chronic ulcer of unspecified part of unspecified lower leg with unspecified severity: Secondary | ICD-10-CM | POA: Insufficient documentation

## 2012-06-03 ENCOUNTER — Encounter: Payer: Self-pay | Admitting: Vascular Surgery

## 2012-06-03 ENCOUNTER — Ambulatory Visit (INDEPENDENT_AMBULATORY_CARE_PROVIDER_SITE_OTHER): Payer: BC Managed Care – PPO | Admitting: Vascular Surgery

## 2012-06-03 VITALS — BP 114/59 | HR 90 | Resp 18 | Ht 68.0 in | Wt 152.8 lb

## 2012-06-03 DIAGNOSIS — I83893 Varicose veins of bilateral lower extremities with other complications: Secondary | ICD-10-CM

## 2012-06-03 NOTE — Progress Notes (Signed)
Cleansed bilateral venous ulcers right and left legs with Carraklenz.  Applied Silvadene to bilateral venous ulcers then applied sterile 4x4s over ulcerations then applied Kerlix and Cobain wrap to bilateral foot and calves. Pt. Tolerated procedure well.  [name] [name], [name] [name]

## 2012-06-03 NOTE — Progress Notes (Signed)
The patient presents today for followup of emergent surgery for bleeding from her saphenous vein at her ankle due to severe venous stasis disease. She has had no further bleeding and the suture sutures were removed from this area today. She does have bilateral severe venous stasis ulceration. She is currently using Silvadene and Ace wrap compression with good response. She is being seen by the wound center for followup of her chronic wounds. We will see her again in one month for followup as well suspect that she will come to staged bilateral ablation of her saphenous vein for her  superficial component of her venous hypertension

## 2012-06-12 ENCOUNTER — Encounter (HOSPITAL_BASED_OUTPATIENT_CLINIC_OR_DEPARTMENT_OTHER): Payer: BC Managed Care – PPO | Attending: General Surgery

## 2012-06-12 ENCOUNTER — Ambulatory Visit (HOSPITAL_COMMUNITY)
Admission: RE | Admit: 2012-06-12 | Discharge: 2012-06-12 | Disposition: A | Discharge status: Home / Self Care | Payer: BC Managed Care – PPO | Source: Ambulatory Visit | Attending: Internal Medicine | Admitting: Internal Medicine

## 2012-06-12 ENCOUNTER — Encounter (HOSPITAL_COMMUNITY): Payer: Self-pay

## 2012-06-12 VITALS — BP 105/60 | HR 80 | Ht 68.0 in | Wt 148.4 lb

## 2012-06-12 DIAGNOSIS — Z79899 Other long term (current) drug therapy: Secondary | ICD-10-CM | POA: Insufficient documentation

## 2012-06-12 DIAGNOSIS — I10 Essential (primary) hypertension: Secondary | ICD-10-CM | POA: Insufficient documentation

## 2012-06-12 DIAGNOSIS — I2789 Other specified pulmonary heart diseases: Secondary | ICD-10-CM

## 2012-06-12 DIAGNOSIS — L97409 Non-pressure chronic ulcer of unspecified heel and midfoot with unspecified severity: Secondary | ICD-10-CM | POA: Insufficient documentation

## 2012-06-12 DIAGNOSIS — I2729 Other secondary pulmonary hypertension: Secondary | ICD-10-CM

## 2012-06-12 DIAGNOSIS — R69 Illness, unspecified: Secondary | ICD-10-CM

## 2012-06-12 DIAGNOSIS — I872 Venous insufficiency (chronic) (peripheral): Secondary | ICD-10-CM | POA: Insufficient documentation

## 2012-06-12 DIAGNOSIS — M349 Systemic sclerosis, unspecified: Secondary | ICD-10-CM | POA: Insufficient documentation

## 2012-06-12 DIAGNOSIS — E119 Type 2 diabetes mellitus without complications: Secondary | ICD-10-CM | POA: Insufficient documentation

## 2012-06-12 LAB — HEPATIC FUNCTION PANEL
ALT: 13 U/L (ref 0–35)
AST: 30 U/L (ref 0–37)
Albumin: 3.7 g/dL (ref 3.5–5.2)
Alkaline Phosphatase: 134 U/L — ABNORMAL HIGH (ref 39–117)
Bilirubin, Direct: 0.1 mg/dL (ref 0.0–0.3)
Indirect Bilirubin: 0.4 mg/dL (ref 0.3–0.9)
Total Bilirubin: 0.5 mg/dL (ref 0.3–1.2)
Total Protein: 7.4 g/dL (ref 6.0–8.3)

## 2012-06-12 MED ORDER — BOSENTAN 62.5 MG PO TABS: 125.0000 mg | ORAL_TABLET | Freq: Two times a day (BID) | ORAL | Status: DC

## 2012-06-12 NOTE — Assessment & Plan Note (Addendum)
Feeling gradually better. Will increase Tracleer 125 mg twice a day. Add revatio 20 mg tid in 2 weeks.  Follow up in 1 month with a  6 MW with forehead probe.  Follow up in 2 months with an ECHO.  Check liver function monthly. Dr. Arnoldo Morale working on getting her connected with Rheumatologist in town to transfer care from [place].

## 2012-06-12 NOTE — Patient Instructions (Addendum)
Take Tracleer 125 mg twice a day  In two weeks start Revatio 20 mg tid.  Follow up in 1 month with a 6 minute walk with forehead probe  Follow up in 2 months with an ECHO

## 2012-06-16 ENCOUNTER — Telehealth (HOSPITAL_COMMUNITY): Payer: Self-pay | Admitting: *Deleted

## 2012-06-16 ENCOUNTER — Encounter: Payer: Self-pay | Admitting: Internal Medicine

## 2012-06-16 ENCOUNTER — Ambulatory Visit (INDEPENDENT_AMBULATORY_CARE_PROVIDER_SITE_OTHER): Payer: BC Managed Care – PPO | Admitting: Internal Medicine

## 2012-06-16 VITALS — BP 136/72 | HR 76 | Temp 98.6°F | Resp 18 | Ht 68.0 in | Wt 149.0 lb

## 2012-06-16 DIAGNOSIS — I779 Disorder of arteries and arterioles, unspecified: Secondary | ICD-10-CM

## 2012-06-16 DIAGNOSIS — I743 Embolism and thrombosis of arteries of the lower extremities: Secondary | ICD-10-CM

## 2012-06-16 DIAGNOSIS — I2729 Other secondary pulmonary hypertension: Secondary | ICD-10-CM

## 2012-06-16 DIAGNOSIS — R69 Illness, unspecified: Secondary | ICD-10-CM

## 2012-06-16 DIAGNOSIS — I2789 Other specified pulmonary heart diseases: Secondary | ICD-10-CM

## 2012-06-16 DIAGNOSIS — L940 Localized scleroderma [morphea]: Secondary | ICD-10-CM

## 2012-06-16 NOTE — Progress Notes (Signed)
Subjective:    [name], female    DOB: 08/27/1951, 61 y.o.   MRN: [id number]  HPI Pt has been seen by cardiology and has been places on a titrations of medications With goal of taking both Revatio and Tracleer for pulmonary HTN Mood is stable Oxygen dependent  pulmonary hypertension reviewed medication list We have referred her to local pulmonary and local cardiology and we are seeking to get rheumatology in the region.  She has been previously followed by a rheumatologist in [place]. Her diagnoses is scleroderma   Review of Systems  Constitutional: Negative for activity change, appetite change and fatigue.  HENT: Negative for ear pain, congestion, neck pain, postnasal drip and sinus pressure.   Eyes: Negative for redness and visual disturbance.  Respiratory: Positive for shortness of breath. Negative for cough and wheezing.   Cardiovascular: Positive for leg swelling.  Gastrointestinal: Negative for abdominal pain and abdominal distention.  Genitourinary: Negative for dysuria, frequency and menstrual problem.  Musculoskeletal: Positive for myalgias, joint swelling and arthralgias.  Skin: Negative for rash and wound.       Minimal sclerodactyly  Neurological: Positive for weakness and numbness. Negative for dizziness and headaches.  Hematological: Negative for adenopathy. Does not bruise/bleed easily.  Psychiatric/Behavioral: Negative for disturbed wake/sleep cycle and decreased concentration.   Past Medical History  Diagnosis Date  . History of chicken pox   . Hypertension   . Sclerodermia generalized   . Non Hodgkin's lymphoma     Treated with chemo 2010, felt to be cured.  . Melanoma 1999  . Peripheral arterial occlusive disease   . Neuropathic foot ulcer   . DOE (dyspnea on exertion)     History   Social History  . Marital Status: Married    Spouse Name: N/A    Number of Children: 1  . Years of Education: N/A   Occupational History  .  homemaker    Social History Main Topics  . Smoking status: Never Smoker   . Smokeless tobacco: Never Used  . Alcohol Use: Yes     couple of times a week  . Drug Use: No  . Sexually Active: Not Currently   Other Topics Concern  . Not on file   Social History Narrative  . No narrative on file    Past Surgical History  Procedure Date  . Rt little toe removal 10/1998  . Stent left thigh 3/11  . Tonsillectomy and adenoidectomy   . Small intestine surgery   . Biopsy on cerebellum   . Vein ligation and stripping 05/16/2012    Procedure: VEIN LIGATION AND STRIPPING;  Surgeon: [name], MD;  Location: [facility];  Service: Vascular;  Laterality: Right;  Irrigation and Debridement right lower leg, ligation of saphenous vein.    Family History  Problem Relation Age of Onset  . Lupus Father   . Lymphoma Father   . Hyperlipidemia Mother   . Cancer Daughter     sarcoma  . Colon cancer      Allergies  Allergen Reactions  . Sulfa Antibiotics     Severe rash with significant peeling of face. No airway involvement per patient  . Penicillins Rash    Current Outpatient Prescriptions on File Prior to Visit  Medication Sig Dispense Refill  . amitriptyline (ELAVIL) 25 MG tablet Take 25 mg by mouth at bedtime.      Marland Kitchen aspirin 81 MG tablet Take 81 mg by mouth daily.        Marland Kitchen  azelastine (ASTELIN) 137 MCG/SPRAY nasal spray Place 2 sprays into the nose daily. Use in each nostril at bedtime      . BIOTIN PO Take 1 tablet by mouth daily at 12 noon.       . bosentan (TRACLEER) 62.5 MG tablet Take 2 tablets (125 mg total) by mouth 2 (two) times daily.  120 tablet  3  . Cholecalciferol (VITAMIN D) 2000 UNITS CAPS Take by mouth daily.        . citalopram (CELEXA) 20 MG tablet Take 1 tablet (20 mg total) by mouth daily.  30 tablet  11  . clopidogrel (PLAVIX) 75 MG tablet Take 75 mg by mouth daily.        . collagenase (SANTYL) ointment Apply 1 application topically 2 (two) times daily. To both legs.       Marland Kitchen doxycycline (VIBRA-TABS) 100 MG tablet Take 100 mg by mouth 2 (two) times daily.      . furosemide (LASIX) 20 MG tablet Take 20 mg by mouth 2 (two) times daily.        Marland Kitchen gabapentin (NEURONTIN) 300 MG capsule Take 300 mg by mouth 2 (two) times daily.      . mometasone (NASONEX) 50 MCG/ACT nasal spray Place 2 sprays into the nose daily.  17 g  6  . multivitamin (THERAGRAN) per tablet Take 1 tablet by mouth daily.        Marland Kitchen NEXIUM 40 MG capsule Take 1 tablet by mouth Daily.      Marland Kitchen omega-3 acid ethyl esters (LOVAZA) 1 G capsule Take 1 g by mouth See admin instructions. 1 g cap at noon and then 2 capsules at bedtime      . Probiotic Product (PROBIOTIC FORMULA PO) Take 1 capsule by mouth daily.      Marland Kitchen spironolactone (ALDACTONE) 25 MG tablet Take 25 mg by mouth daily.       . vitamin B-12 (CYANOCOBALAMIN) 500 MCG tablet Take 500 mcg by mouth 2 (two) times daily.         BP 136/72  Pulse 76  Temp 98.6 F (37 C)  Resp 18  Ht _0  (1.727 m)  Wt 149 lb (67.586 kg)  BMI 22.66 kg/m2       Objective:   Physical Exam  Nursing note and vitals reviewed. Constitutional: She is oriented to person, place, and time. She appears well-nourished.  HENT:  Head: Normocephalic and atraumatic.  Eyes: Conjunctivae are normal. Pupils are equal, round, and reactive to light.  Neck: Normal range of motion. Neck supple.  Cardiovascular: Normal rate.   Murmur heard. Pulmonary/Chest: She has rales.       Increased breath sounds with dry crackles at bases  Abdominal: Soft. Bowel sounds are normal.  Musculoskeletal: Normal range of motion.       Early clubbing changes   Neurological: She is alert and oriented to person, place, and time.  Skin: Skin is warm and dry.  Psychiatric: She has a normal mood and affect. Her behavior is normal.          Assessment & Plan:  Stop the neurontin due to side effects Stay on the elavil Referral [facility]. Continue medication titration per  cardiology 4 pulmonary hypertension , monitoring echocardiogram  Stable hypertension Nonprogression of peripheral artery occlusive disease monitored by vascular surgery

## 2012-06-16 NOTE — Patient Instructions (Addendum)
"  pratical paleo" Stop the Neurontin and see how the peripheral neuropathy is affected if you notice a significant worsening resume the Neurontin and we will ween off the Elavil

## 2012-06-17 NOTE — Telephone Encounter (Signed)
Pt needs monthly liver function test due to Tracleer, order placed and appt scheduled for [place] the 2nd Friday of every month, pt is aware

## 2012-06-29 NOTE — Progress Notes (Signed)
[name], female   DOB: 05-25-1951, 61 y.o.   MRN: [id number] Referring Physician: Primary Care: Primary Cardiologist:  Weight Range: Baseline proBNP:  HPI: [name] is a 61 y.o. 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  The patient has been treated with tracleer and revatio for approximately one to 2 years, and then tells me the medications were stopped because the echo "looked good".  Initially was in [place] then moved to [place] for 2 years. Has been in [place] for 1 year.   With regard to her scleroderma.  Has been following with Dr. Ricky Stabs in [place]. Prednisone was stopped several years ago and has not been anything for her scleroderma since that time.  Diagnosed with PAH in 2007 in [place]/ She did have a right heart catheterization by her history for verification. The patient has been treated with cellcept, tracleer and revatio for approximately one to 2 years, and then tells me the medications were stopped in [place] because the echo "looked good".  I do not have her old records available for review. Apparently she was followed very closely with serial echocardiograms that remained fairly normal, and therefore she was left off medications. She had a recent echo last month in [place] as a f/u for her chemotherapy. LV EF 65-70%. RV severely enlarged/HK severe TR. pRVSP 115-120. She saw Dr. Gwenette Greet 2-3 weeks ago and referred here for [place]. Also had CXR which showed RML PNA and treated with abx. No evidence of pulmonary fibrosis.   Underwent RHC on 4/19 as below:  RA = 9  RV = 75/11/14  PA = 71/25 (44)  PCW = 4  Fick cardiac output/index = 3.0/1.7  Thermo CO/CI = 2.7/1.5  PVR = 23.5 [place]  FA sat = 79% on room air (checked 3 times). Sat with finger probe on air 94%  PA sat = 41%, 43%   MW 04/18/12 880 feet   She returns for follow up. Denies  SOB/PND/Orthopnea. On chronic 3 liters [place]. Chronic lower extremity edema/wounds. Difficulty with bleeding associated with debridement. Now followed by Dr [name]. Started on traclear last month and feels better. She does not weigh at home.  Review of Systems: [y] = yes, _0  = no   General: Weight gain _1 ; Weight loss _2 ; Anorexia _3 ; Fatigue _4 ; Fever _5 ; Chills _6 ; Weakness _7   Cardiac: Chest pain/pressure _8 ; Resting SOB _9 ; Exertional SOB [Y ]; Orthopnea _10 ; Pedal Edema _11 ; Palpitations _12 ; Syncope _13 ; Presyncope _14 ; Paroxysmal nocturnal dyspnea_15   Pulmonary: Cough _16 ; Wheezing_17 ; Hemoptysis_18 ; Sputum _19 ; Snoring _20   GI: Vomiting_21 ; Dysphagia_22 ; Melena_23 ; Hematochezia _24 ; Heartburn_25 ; Abdominal pain _26 ; Constipation _27 ; Diarrhea _28 ; BRBPR _29   GU: Hematuria_30 ; Dysuria _31 ; Nocturia_32   Vascular: Pain in legs with walking _33 ; Pain in feet with lying flat _34 ; Non-healing sores _35 ; Stroke _36 ; TIA _37 ; Slurred speech _38 ;  Neuro: Headaches_39 ; Vertigo_40 ; Seizures_41 ; Paresthesias_42 ;Blurred vision _43 ; Diplopia _44 ; Vision changes _45   Ortho/Skin: Arthritis _46 ; Joint pain _47 ; Muscle pain _48 ; Joint swelling [Y ]; Back Pain _49 ; Rash _50   Psych: Depression_51 ; Anxiety_52   Heme: Bleeding problems [Y ]; Clotting  disorders _0 ; Anemia _1   Endocrine: Diabetes _2 ; Thyroid dysfunction_3    Past Medical History  Diagnosis Date  . History of chicken pox   . Hypertension   . Sclerodermia generalized   . Non Hodgkin's lymphoma     Treated with chemo 2010, felt to be cured.  . Melanoma 1999  . Peripheral arterial occlusive disease   . Neuropathic foot ulcer   . DOE (dyspnea on exertion)     Current Outpatient Prescriptions  Medication Sig Dispense Refill  . amitriptyline (ELAVIL) 25 MG tablet Take 25 mg by mouth at bedtime.      Marland Kitchen aspirin 81 MG tablet Take 81 mg by mouth daily.        Marland Kitchen azelastine (ASTELIN) 137 MCG/SPRAY nasal spray Place 2 sprays into the nose  daily. Use in each nostril at bedtime      . BIOTIN PO Take 1 tablet by mouth daily at 12 noon.       . bosentan (TRACLEER) 62.5 MG tablet Take 2 tablets (125 mg total) by mouth 2 (two) times daily.  120 tablet  3  . Cholecalciferol (VITAMIN D) 2000 UNITS CAPS Take by mouth daily.        . citalopram (CELEXA) 20 MG tablet Take 1 tablet (20 mg total) by mouth daily.  30 tablet  11  . clopidogrel (PLAVIX) 75 MG tablet Take 75 mg by mouth daily.        . collagenase (SANTYL) ointment Apply 1 application topically 2 (two) times daily. To both legs.      Marland Kitchen doxycycline (VIBRA-TABS) 100 MG tablet Take 100 mg by mouth 2 (two) times daily.      . furosemide (LASIX) 20 MG tablet Take 20 mg by mouth 2 (two) times daily.        Marland Kitchen gabapentin (NEURONTIN) 300 MG capsule Take 300 mg by mouth 2 (two) times daily.      . mometasone (NASONEX) 50 MCG/ACT nasal spray Place 2 sprays into the nose daily.  17 g  6  . multivitamin (THERAGRAN) per tablet Take 1 tablet by mouth daily.        Marland Kitchen NEXIUM 40 MG capsule Take 1 tablet by mouth Daily.      Marland Kitchen omega-3 acid ethyl esters (LOVAZA) 1 G capsule Take 1 g by mouth See admin instructions. 1 g cap at noon and then 2 capsules at bedtime      . Probiotic Product (PROBIOTIC FORMULA PO) Take 1 capsule by mouth daily.      Marland Kitchen spironolactone (ALDACTONE) 25 MG tablet Take 25 mg by mouth daily.       . vitamin B-12 (CYANOCOBALAMIN) 500 MCG tablet Take 500 mcg by mouth 2 (two) times daily.         Allergies  Allergen Reactions  . Sulfa Antibiotics     Severe rash with significant peeling of face. No airway involvement per patient  . Penicillins Rash    History   Social History  . Marital Status: Married    Spouse Name: N/A    Number of Children: 1  . Years of Education: N/A   Occupational History  . homemaker    Social History Main Topics  . Smoking status: Never Smoker   . Smokeless tobacco: Never Used  . Alcohol Use: Yes     couple of times a week  . Drug Use:  No  . Sexually Active: Not Currently   Other Topics Concern  .  Not on file   Social History Narrative  . No narrative on file    Family History  Problem Relation Age of Onset  . Lupus Father   . Lymphoma Father   . Hyperlipidemia Mother   . Cancer Daughter     sarcoma  . Colon cancer      PHYSICAL EXAM: Filed Vitals:   06/12/12 1338  BP: 105/60  Pulse: 80  Height: _0  (1.727 m)  Weight: 148 lb 6.4 oz (67.314 kg)    General: No acute distress No respiratory difficulty HEENT: normal Neck: supple. JVP5-6. Carotids 2+ bilat; no bruits. No lymphadenopathy or thryomegaly appreciated. Cor: PMI nondisplaced. Regular rate & rhythm. Prominent P2. Soft TR murmur. No obvious RV lift Lungs: clear on 3 liters [place] Abdomen: soft, nontender, nondistended. No hepatosplenomegaly. No bruits or masses. Good bowel sounds. 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 + gouty nodules. LEs wrapped due to ulcers. 1+ edema Neuro: alert & oriented x 3, cranial nerves grossly intact. moves all 4 extremities w/o difficulty. Affect pleasant.    ASSESSMENT & PLAN:

## 2012-07-04 ENCOUNTER — Telehealth (HOSPITAL_COMMUNITY): Payer: Self-pay | Admitting: Vascular Surgery

## 2012-07-04 NOTE — Telephone Encounter (Signed)
Returned call to the patient in regards to her Adcirca.  Approval for [name] [name] has been faxed to her insurance company.  Currently awaiting approval.  Will have [name] follow up next week.  She appreciated the call back.

## 2012-07-04 NOTE — Telephone Encounter (Signed)
Pt was told she going to be put on some med in two weeks her two weeks was up last week she would like to talk to someone

## 2012-07-07 ENCOUNTER — Telehealth (HOSPITAL_COMMUNITY): Payer: Self-pay | Admitting: *Deleted

## 2012-07-07 NOTE — Telephone Encounter (Signed)
[name] [name] called today in regards to revatio. She is suppose to be starting this medication this week with the increase of the tracleer.  There might be some confusion on where she is suppose to be getting this medication. She has taken it in the past and had it filled at a major pharmacy, she currently is using [facility] on [address], so she can get it there.  Please follow up with her.

## 2012-07-08 ENCOUNTER — Ambulatory Visit: Payer: BC Managed Care – PPO | Admitting: Vascular Surgery

## 2012-07-10 ENCOUNTER — Encounter (HOSPITAL_BASED_OUTPATIENT_CLINIC_OR_DEPARTMENT_OTHER): Payer: BC Managed Care – PPO

## 2012-07-11 ENCOUNTER — Other Ambulatory Visit: Payer: BC Managed Care – PPO

## 2012-07-11 NOTE — Telephone Encounter (Signed)
Revatio was approved and pt states she received the med and started taking it on Tuesday.  Also am working on getting pt's Tracleer approved since her insurance changed a little form has been completed and faxed to [facility], [facility] is aware as well

## 2012-07-14 ENCOUNTER — Other Ambulatory Visit: Payer: BC Managed Care – PPO

## 2012-07-14 ENCOUNTER — Encounter: Payer: Self-pay | Admitting: Vascular Surgery

## 2012-07-14 DIAGNOSIS — I2789 Other specified pulmonary heart diseases: Secondary | ICD-10-CM

## 2012-07-14 LAB — HEPATIC FUNCTION PANEL
ALT: 18 U/L (ref 0–35)
AST: 36 U/L (ref 0–37)
Albumin: 3.6 g/dL (ref 3.5–5.2)
Alkaline Phosphatase: 159 U/L — ABNORMAL HIGH (ref 39–117)
Bilirubin, Direct: 0.2 mg/dL (ref 0.0–0.3)
Total Bilirubin: 0.6 mg/dL (ref 0.3–1.2)
Total Protein: 7 g/dL (ref 6.0–8.3)

## 2012-07-15 ENCOUNTER — Encounter: Payer: Self-pay | Admitting: Vascular Surgery

## 2012-07-15 ENCOUNTER — Ambulatory Visit (INDEPENDENT_AMBULATORY_CARE_PROVIDER_SITE_OTHER): Payer: BC Managed Care – PPO | Admitting: Vascular Surgery

## 2012-07-15 VITALS — BP 115/66 | HR 86 | Temp 97.6°F | Resp 16 | Ht 68.0 in | Wt 152.0 lb

## 2012-07-15 DIAGNOSIS — I83893 Varicose veins of bilateral lower extremities with other complications: Secondary | ICD-10-CM

## 2012-07-15 NOTE — Progress Notes (Signed)
Patient presents today for 10 followup of her venous and arterial pathology. She's had no further bleeding since a major bleed from her right great saphenous vein at her ankle which was treated in surgery. She's continued to have local care of this area at the the wound center. I have a complete office charts from the prior surgical treatment achieved in [facility]. She had a left common femoral artery endarterectomy and left superficial artery stenting and also had a right superficial femoral artery stent. She is known narrowing at the tibioperoneal trunk on the right. Her venous ulcers are very slowly healing with 2 large ulcers on her left leg and 2 large ulcers on her right leg. The area at the bleed from her saphenous vein I has a very deep wound with some necrotic debris which was reached today in the office. Does have palpable popliteal pulses bilaterally I do not feel distal pulses. She does have significant swelling. On listening with a pencil probe Doppler she does have good Doppler signal at the dorsalis pedis and posterior tibial bilaterally.  Again reviewed her right leg venous duplex with her. This does show gross reflux and enlarged great saphenous vein. She's had several months of treatment now with By constant 24-hour date she may benefit from similar procedure on the left but I would defer this until she has had recovered from the right leg. She understands this is an outpatient procedure in our office under local anesthetic she understands that should take approximately one hourtube and compresWe will proceed with this once we have assured insurance coveragesion and continues to have nonhealing of the saphenous ulcers. I have recommended that we proceed with laser ablation of her right great saphenous vein for improvement in her venous hypertension.

## 2012-07-17 ENCOUNTER — Other Ambulatory Visit: Payer: Self-pay | Admitting: *Deleted

## 2012-07-17 ENCOUNTER — Encounter (HOSPITAL_BASED_OUTPATIENT_CLINIC_OR_DEPARTMENT_OTHER): Payer: BC Managed Care – PPO | Attending: General Surgery

## 2012-07-17 DIAGNOSIS — I872 Venous insufficiency (chronic) (peripheral): Secondary | ICD-10-CM | POA: Insufficient documentation

## 2012-07-17 DIAGNOSIS — I83219 Varicose veins of right lower extremity with both ulcer of unspecified site and inflammation: Secondary | ICD-10-CM

## 2012-07-17 DIAGNOSIS — Z79899 Other long term (current) drug therapy: Secondary | ICD-10-CM | POA: Insufficient documentation

## 2012-07-17 DIAGNOSIS — E119 Type 2 diabetes mellitus without complications: Secondary | ICD-10-CM | POA: Insufficient documentation

## 2012-07-17 DIAGNOSIS — I10 Essential (primary) hypertension: Secondary | ICD-10-CM | POA: Insufficient documentation

## 2012-07-17 DIAGNOSIS — L97409 Non-pressure chronic ulcer of unspecified heel and midfoot with unspecified severity: Secondary | ICD-10-CM | POA: Insufficient documentation

## 2012-07-17 DIAGNOSIS — L97919 Non-pressure chronic ulcer of unspecified part of right lower leg with unspecified severity: Secondary | ICD-10-CM

## 2012-07-17 DIAGNOSIS — M349 Systemic sclerosis, unspecified: Secondary | ICD-10-CM | POA: Insufficient documentation

## 2012-07-21 ENCOUNTER — Encounter (HOSPITAL_COMMUNITY): Payer: Self-pay

## 2012-07-21 ENCOUNTER — Ambulatory Visit (HOSPITAL_COMMUNITY)
Admission: RE | Admit: 2012-07-21 | Discharge: 2012-07-21 | Disposition: A | Discharge status: Home / Self Care | Payer: BC Managed Care – PPO | Source: Ambulatory Visit | Attending: Internal Medicine | Admitting: Internal Medicine

## 2012-07-21 VITALS — BP 118/68 | HR 84 | Ht 68.0 in | Wt 155.0 lb

## 2012-07-21 DIAGNOSIS — I2729 Other secondary pulmonary hypertension: Secondary | ICD-10-CM

## 2012-07-21 DIAGNOSIS — I2789 Other specified pulmonary heart diseases: Secondary | ICD-10-CM

## 2012-07-21 DIAGNOSIS — R69 Illness, unspecified: Secondary | ICD-10-CM

## 2012-07-21 DIAGNOSIS — I10 Essential (primary) hypertension: Secondary | ICD-10-CM

## 2012-07-21 MED ORDER — FUROSEMIDE 40 MG PO TABS: 40.0000 mg | ORAL_TABLET | Freq: Two times a day (BID) | ORAL | Status: DC

## 2012-07-21 MED ORDER — POTASSIUM CHLORIDE CRYS ER 20 MEQ PO TBCR: 20.0000 meq | EXTENDED_RELEASE_TABLET | Freq: Two times a day (BID) | ORAL | Status: DC

## 2012-07-21 MED ORDER — SILDENAFIL CITRATE 20 MG PO TABS: 20.0000 mg | ORAL_TABLET | Freq: Three times a day (TID) | ORAL | Status: DC

## 2012-07-21 NOTE — Assessment & Plan Note (Addendum)
Now on combination therapy. Seems somewhat improved but confounded by edema which is likely related to bosentan. Will increase lasix to 40 bid and add kcl 20 bid. Check labs on Wednesday (prior to surgery). See back next week. If fluid not getting better call me. At some point will need to do brief hall walk with forehead pulse-ox to ensure that exertional O2 sats are >90%.  Once fluid off will aggressively titrate revatio up to 80 tid in stepwise fashion.

## 2012-07-21 NOTE — Patient Instructions (Addendum)
Increase Furosemide to 40 mg Twice daily   Start K-dur 20 meq Twice daily   Labs on Wed. 7/24 at [facility] recommends that you schedule a follow-up appointment in: 1 week

## 2012-07-21 NOTE — Progress Notes (Signed)
[name], female   DOB: 11-16-1951, 61 y.o.   MRN: [id number] Referring Physician: Primary Care: Primary Cardiologist:  Weight Range: Baseline proBNP:  HPI: [name] is a 61 y.o. 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  The patient has been treated with tracleer and revatio for approximately one to 2 years, and then tells me the medications were stopped because the echo "looked good".  Initially was in [place] then moved to [place] for 2 years. Has been in [place] for 1 year.   With regard to her scleroderma.  Has been following with Dr. Ricky Stabs in [place]. Prednisone was stopped several years ago and has not been anything for her scleroderma since that time.  Diagnosed with PAH in 2007 in [place]/ She did have a right heart catheterization by her history for verification. The patient has been treated with cellcept, tracleer and revatio for approximately one to 2 years, and then tells me the medications were stopped in [place] because the echo "looked good".  I do not have her old records available for review. Apparently she was followed very closely with serial echocardiograms that remained fairly normal, and therefore she was left off medications. She had a recent echo last month in [place] as a f/u for her chemotherapy. LV EF 65-70%. RV severely enlarged/HK severe TR. pRVSP 115-120. She saw Dr. Gwenette Greet 2-3 weeks ago and referred here for [place]. Also had CXR which showed RML PNA and treated with abx. No evidence of pulmonary fibrosis.   Underwent RHC on 4/19 as below:  RA = 9  RV = 75/11/14  PA = 71/25 (44)  PCW = 4  Fick cardiac output/index = 3.0/1.7  Thermo CO/CI = 2.7/1.5  PVR = 23.5 Woods  FA sat = 79% on room air (checked 3 times). Sat with finger probe on air 94%  PA sat = 41%, 43%   MW 04/18/12 880 feet   In June we increased her bosentan  to 125 bid. Two weeks ago also started Revatio 20 tid. Tolerating meds well. However remains dyspneic with minimal exertion. + edema. Up 10 pounds. On chronic 3 liters [place]. Chronic lower extremity edema/wounds. Having venous surgery with Dr. [name] on Thursday. No syncope or presyncope. LFTs checked last week - OK.   Review of Systems: [y] = yes, _0  = no   General: Weight gain _1 ; Weight loss _2 ; Anorexia _3 ; Fatigue _4 ; Fever _5 ; Chills _6 ; Weakness _7   Cardiac: Chest pain/pressure _8 ; Resting SOB _9 ; Exertional SOB [Y ]; Orthopnea _10 ; Pedal Edema _11 ; Palpitations _12 ; Syncope _13 ; Presyncope _14 ; Paroxysmal nocturnal dyspnea_15   Pulmonary: Cough _16 ; Wheezing_17 ; Hemoptysis_18 ; Sputum _19 ; Snoring _20   GI: Vomiting_21 ; Dysphagia_22 ; Melena_23 ; Hematochezia _24 ; Heartburn_25 ; Abdominal pain _26 ; Constipation _27 ; Diarrhea _28 ; BRBPR _29   GU: Hematuria_30 ; Dysuria _31 ; Nocturia_32   Vascular: Pain in legs with walking _33 ; Pain in feet with lying flat _34 ; Non-healing sores _35 ; Stroke _36 ; TIA _37 ; Slurred speech _38 ;  Neuro: Headaches_39 ; Vertigo_40 ; Seizures_41 ; Paresthesias_42 ;Blurred vision _43 ; Diplopia _44 ; Vision changes _45   Ortho/Skin: Arthritis _46 ; Joint pain _47 ; Muscle pain _48 ; Joint swelling [Y ]; Back Pain _49 ;  Rash _0   Psych: Depression_1 ; Anxiety_2   Heme: Bleeding problems [Y ]; Clotting disorders _3 ; Anemia _4   Endocrine: Diabetes _5 ; Thyroid dysfunction_6    Past Medical History  Diagnosis Date  . History of chicken pox   . Hypertension   . Sclerodermia generalized   . Non Hodgkin's lymphoma     Treated with chemo 2010, felt to be cured.  . Melanoma 1999  . Peripheral arterial occlusive disease   . Neuropathic foot ulcer   . DOE (dyspnea on exertion)     Current Outpatient Prescriptions  Medication Sig Dispense Refill  . amitriptyline (ELAVIL) 25 MG tablet Take 25 mg by mouth at bedtime.      Marland Kitchen aspirin 81 MG tablet Take 81 mg by mouth daily.          Marland Kitchen atorvastatin (LIPITOR) 40 MG tablet Take 1 tablet by mouth Daily.      Marland Kitchen azelastine (ASTELIN) 137 MCG/SPRAY nasal spray Place 2 sprays into the nose daily. Use in each nostril at bedtime      . BIOTIN PO Take 1 tablet by mouth daily at 12 noon.       . bosentan (TRACLEER) 125 MG tablet Take 125 mg by mouth 2 (two) times daily.      . Cholecalciferol (VITAMIN D) 2000 UNITS CAPS Take by mouth daily.        . citalopram (CELEXA) 20 MG tablet Take 1 tablet (20 mg total) by mouth daily.  30 tablet  11  . clopidogrel (PLAVIX) 75 MG tablet Take 75 mg by mouth daily.        . collagenase (SANTYL) ointment Apply 1 application topically 2 (two) times daily. To both legs.      . furosemide (LASIX) 20 MG tablet Take 20 mg by mouth 2 (two) times daily.        Marland Kitchen gabapentin (NEURONTIN) 300 MG capsule One tablet in the morning and two tablets at night      . mometasone (NASONEX) 50 MCG/ACT nasal spray Place 2 sprays into the nose daily.  17 g  6  . multivitamin (THERAGRAN) per tablet Take 1 tablet by mouth daily.        Marland Kitchen NEXIUM 40 MG capsule Take 1 tablet by mouth Daily.      Marland Kitchen omega-3 acid ethyl esters (LOVAZA) 1 G capsule Take 1 g by mouth See admin instructions. 1 g cap at noon and then 2 capsules at bedtime      . Probiotic Product (PROBIOTIC FORMULA PO) Take 1 capsule by mouth daily.      Marland Kitchen REVATIO 20 MG tablet Take 20 mg by mouth 3 (three) times daily.      Marland Kitchen spironolactone (ALDACTONE) 25 MG tablet Take 25 mg by mouth daily.       . vitamin B-12 (CYANOCOBALAMIN) 500 MCG tablet Take 500 mcg by mouth 2 (two) times daily.         Allergies  Allergen Reactions  . Sulfa Antibiotics     Severe rash with significant peeling of face. No airway involvement per patient  . Penicillins Rash    History   Social History  . Marital Status: Married    Spouse Name: N/A    Number of Children: 1  . Years of Education: N/A   Occupational History  . homemaker    Social History Main Topics  . Smoking  status: Never Smoker   . Smokeless tobacco: Never Used  .  Alcohol Use: Yes     couple of times a week  . Drug Use: No  . Sexually Active: Not Currently   Other Topics Concern  . Not on file   Social History Narrative  . No narrative on file    Family History  Problem Relation Age of Onset  . Lupus Father   . Lymphoma Father   . Hyperlipidemia Mother   . Cancer Daughter     sarcoma  . Colon cancer      PHYSICAL EXAM: Filed Vitals:   07/21/12 1426  BP: 118/68  Pulse: 84  Height: _0  (1.727 m)  Weight: 155 lb (70.308 kg)    General: No acute distress No respiratory difficulty HEENT: normal Neck: supple. JVP 5-6. Carotids 2+ bilat; no bruits. No lymphadenopathy or thryomegaly appreciated. Cor: PMI nondisplaced. Regular rate & rhythm. Prominent P2. Soft TR murmur. No obvious RV lift Lungs: clear on 3 liters [place] Abdomen: soft, nontender, nondistended. No hepatosplenomegaly. No bruits or masses. Good bowel sounds. 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 + gouty nodules. LEs wrapped due to ulcers. 2+ edema to thighs Neuro: alert & oriented x 3, cranial nerves grossly intact. moves all 4 extremities w/o difficulty. Affect pleasant.    ASSESSMENT & PLAN:

## 2012-07-21 NOTE — Addendum Note (Signed)
Encounter addended by: Scarlette Calico, RN on: 07/21/2012  3:19 PM<BR>     Documentation filed: Patient Instructions Section, Orders

## 2012-07-23 ENCOUNTER — Other Ambulatory Visit: Payer: Self-pay | Admitting: *Deleted

## 2012-07-23 ENCOUNTER — Other Ambulatory Visit (INDEPENDENT_AMBULATORY_CARE_PROVIDER_SITE_OTHER): Payer: BC Managed Care – PPO

## 2012-07-23 ENCOUNTER — Encounter: Payer: Self-pay | Admitting: Vascular Surgery

## 2012-07-23 DIAGNOSIS — I83009 Varicose veins of unspecified lower extremity with ulcer of unspecified site: Secondary | ICD-10-CM

## 2012-07-23 DIAGNOSIS — I2789 Other specified pulmonary heart diseases: Secondary | ICD-10-CM

## 2012-07-23 DIAGNOSIS — R69 Illness, unspecified: Secondary | ICD-10-CM

## 2012-07-23 DIAGNOSIS — L97909 Non-pressure chronic ulcer of unspecified part of unspecified lower leg with unspecified severity: Secondary | ICD-10-CM

## 2012-07-23 DIAGNOSIS — I2729 Other secondary pulmonary hypertension: Secondary | ICD-10-CM

## 2012-07-23 LAB — BASIC METABOLIC PANEL
BUN: 19 mg/dL (ref 6–23)
CO2: 23 mEq/L (ref 19–32)
Calcium: 9.4 mg/dL (ref 8.4–10.5)
Chloride: 96 mEq/L (ref 96–112)
Creatinine, Ser: 0.9 mg/dL (ref 0.4–1.2)
GFR: 69.93 mL/min (ref >60.00)
Glucose, Bld: 102 mg/dL — ABNORMAL HIGH (ref 70–99)
Potassium: 5.5 mEq/L — ABNORMAL HIGH (ref 3.5–5.1)
Sodium: 135 mEq/L (ref 135–145)

## 2012-07-24 ENCOUNTER — Ambulatory Visit (INDEPENDENT_AMBULATORY_CARE_PROVIDER_SITE_OTHER): Payer: BC Managed Care – PPO | Admitting: Vascular Surgery

## 2012-07-24 ENCOUNTER — Telehealth: Payer: Self-pay | Admitting: Internal Medicine

## 2012-07-24 ENCOUNTER — Encounter: Payer: Self-pay | Admitting: Vascular Surgery

## 2012-07-24 VITALS — BP 123/77 | HR 79 | Resp 16 | Ht 68.0 in | Wt 150.0 lb

## 2012-07-24 DIAGNOSIS — L97919 Non-pressure chronic ulcer of unspecified part of right lower leg with unspecified severity: Secondary | ICD-10-CM | POA: Insufficient documentation

## 2012-07-24 DIAGNOSIS — I83219 Varicose veins of right lower extremity with both ulcer of unspecified site and inflammation: Secondary | ICD-10-CM

## 2012-07-24 HISTORY — PX: ENDOVENOUS ABLATION SAPHENOUS VEIN W/ LASER: SUR449

## 2012-07-24 NOTE — Telephone Encounter (Signed)
Spoke with pt - informed of dr. Lora Havens instructions - She is also on KCl bid because it was running low - per dr. Shawna Orleans- hold KCl and aldactone , no NSAIDs - rov next week here with lab before - she states she has appt with dr. Sung Amabile on tues.of next week - I will leave note for bonnye to advise if appt here is needed. KIK

## 2012-07-24 NOTE — Progress Notes (Signed)
Laser Ablation Procedure      Date: 07/24/2012    [name] [name] DOB:01-24-1951  Consent signed: Yes  Surgeon:T.F. Neal Oshea  Procedure: Laser Ablation: right Greater Saphenous Vein  BP 123/77  Pulse 79  Resp 16  Ht 5' 8" (1.727 m)  Wt 150 lb (68.04 kg)  BMI 22.81 kg/m2  Start time: 11:00AM   End time: 12:10AM  Tumescent Anesthesia: 450 cc 0.9% NaCl with 50 cc Lidocaine HCL with 1% Epi and 15 cc 8.4% NaHCO3  Local Anesthesia: 4 cc Lidocaine HCL and NaHCO3 (ratio 2:1)  Continuous Mode: 15 Watts Total Energy 2531 Joules Total Time2:48       Patient tolerated procedure well: Yes  Notes: [name] [name] has open ulcerations right leg ankle and calf.   Silvadene applied to ulcers, sterile 2x2s and 4x4s applied on top of ulcers and then right foot and calf wrapped with Kerlix and topped with Cobain.  Ace wrap applied to right thigh for compression.  Rankin, Meliton Rattan  Description of Procedure:  After marking the course of the saphenous vein and the secondary varicosities in the standing position, the patient was placed on the operating table in the supine position, and the right leg was prepped and draped in sterile fashion. Local anesthetic was administered, and under ultrasound guidance the saphenous vein was accessed with a micro needle and guide wire; then the micro puncture sheath was placed. A guide wire was inserted to the saphenofemoral junction, followed by a 5 french sheath.  The position of the sheath and then the laser fiber below the junction was confirmed using the ultrasound and visualization of the aiming beam.  Tumescent anesthesia was administered along the course of the saphenous vein using ultrasound guidance. Protective laser glasses were placed on the patient, and the laser was fired at at 15 watt continuous mode.  For a total of 2531 joules.  A steri strip was applied to the puncture site.      Marland Kitchen  Ace wrap bandages were applied over right thigh up to  the  saphenofemoral junction. Silvadene was applied to ulcers on right ankle and right foot. 2x2s and 4x4s applied over ulcers and right foot and calf wrapped with Kerlix and topped with Cobain.    Blood loss was less than 15 cc.  The patient transported by wheelchair out of the operating room having tolerated the procedure well.

## 2012-07-24 NOTE — Telephone Encounter (Signed)
I would not use NSAIDs especially considering her recent blood work which shows hyperkalemia.   I suggest pt hold her aldactone and follow up with Dr. Arnoldo Morale next week.  She will need repeat BMET before OV.  Use hyperkalemia code.

## 2012-07-24 NOTE — Telephone Encounter (Signed)
Caller: [name] [name]/Patient; PCP: Benay Pillow; CB#: [phone]; ; ; Call regarding can pt take Advil Husband states pt had vein ablation surgery today and they recommended Advil 658m three times a day, but he wanted to make sure she can use this with other medications that she is on. The physician that did surgery recommended he call as well to make sure that this will be ok.

## 2012-07-24 NOTE — Telephone Encounter (Signed)
Dr. Arnoldo Morale out of office at this time - please advise

## 2012-07-25 ENCOUNTER — Encounter: Payer: Self-pay | Admitting: Vascular Surgery

## 2012-07-25 ENCOUNTER — Telehealth (HOSPITAL_COMMUNITY): Payer: Self-pay | Admitting: *Deleted

## 2012-07-25 DIAGNOSIS — I2789 Other specified pulmonary heart diseases: Secondary | ICD-10-CM

## 2012-07-25 NOTE — Telephone Encounter (Signed)
Message copied by Scarlette Calico on Fri Jul 25, 2012  8:57 AM ------      Message from: Glori Bickers R      Created: Thu Jul 24, 2012 11:03 AM       Repeat 2 weeks

## 2012-07-25 NOTE — Telephone Encounter (Signed)
Dr Arnoldo Morale is aware

## 2012-07-29 ENCOUNTER — Ambulatory Visit (HOSPITAL_COMMUNITY)
Admission: RE | Admit: 2012-07-29 | Discharge: 2012-07-29 | Disposition: A | Discharge status: Home / Self Care | Payer: BC Managed Care – PPO | Source: Ambulatory Visit | Attending: Adult Health | Admitting: Adult Health

## 2012-07-29 ENCOUNTER — Encounter (HOSPITAL_COMMUNITY): Payer: Self-pay

## 2012-07-29 VITALS — BP 116/69 | HR 90 | Ht 68.0 in | Wt 148.4 lb

## 2012-07-29 DIAGNOSIS — R69 Illness, unspecified: Secondary | ICD-10-CM

## 2012-07-29 DIAGNOSIS — I2789 Other specified pulmonary heart diseases: Secondary | ICD-10-CM

## 2012-07-29 DIAGNOSIS — I2729 Other secondary pulmonary hypertension: Secondary | ICD-10-CM

## 2012-07-29 MED ORDER — SILDENAFIL CITRATE 20 MG PO TABS: 40.0000 mg | ORAL_TABLET | Freq: Three times a day (TID) | ORAL | Status: DC

## 2012-07-29 NOTE — Assessment & Plan Note (Signed)
Lower extremity edema improving with increased lasix 40 mg BID.  Have discussed use of sliding scale lasix to keep home weight around 148 pounds.  She says she will start weighing daily.  If weight is stable around 148 pounds for 7-10 days will have her increase revatio to 40 mg TID.  Am hopeful to titrate to 80 mg TID.  Can check 6 MW at follow up appointment.  Will have CMET drawn next week.  She will call if weight begins to climb again.  Have discussed the above recommendations with Dr. [name] via phone and he agrees.

## 2012-07-29 NOTE — Progress Notes (Signed)
Primary Care: Dr. Benay Pillow Primary Cardiologist: Dr. [name] Pulmonologist: Dr. Gwenette Greet  HPI: [name] [name] is a 61 y.o. 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  The patient has been treated with tracleer and revatio for approximately one to 2 years, and then tells me the medications were stopped because the echo "looked good".  Initially was in [place] then moved to [place] for 2 years. Has been in [place] for 1 year.   With regard to her scleroderma.  Has been following with Dr. Ricky Stabs in [place]. Prednisone was stopped several years ago and has not been anything for her scleroderma since that time.  Diagnosed with PAH in 2007 in [place]/ She did have a right heart catheterization by her history for verification. The patient has been treated with cellcept, tracleer and revatio for approximately one to 2 years, and then tells me the medications were stopped in [place] because the echo "looked good".  I do not have her old records available for review. Apparently she was followed very closely with serial echocardiograms that remained fairly normal, and therefore she was left off medications. She had a recent echo last month in [place] as a f/u for her chemotherapy. LV EF 65-70%. RV severely enlarged/HK severe TR. pRVSP 115-120. She saw Dr. Gwenette Greet 2-3 weeks ago and referred here for [place]. Also had CXR which showed RML PNA and treated with abx. No evidence of pulmonary fibrosis.   Underwent RHC on 4/19 as below:  RA = 9  RV = 75/11/14  PA = 71/25 (44)  PCW = 4  Fick cardiac output/index = 3.0/1.7  Thermo CO/CI = 2.7/1.5  PVR = 23.5 [place]  FA sat = 79% on room air (checked 3 times). Sat with finger probe on air 94%  PA sat = 41%, 43%   6 MW 04/18/12 880 feet   Returns for 1 week follow up today.  Volume status elevated last week therefore lasix changed to BID.  She has  had improvement since the increase in lasix.  Her breathing is close to baseline, she does not feel dyspneic with exertion but does still have dyspnea when lifting objects.  Continues on chronic 3 liters.  Legs wrapped due to nonhealing wounds on lower extremities.  She underwent right greater savenous vein laser ablation by Dr. [name] on 7/25 in hopes to help with healing.  She denies dizziness/syncope.  Tolerating revatio at this time.  Not weighing daily.     Review of Systems: All pertinent positives and negatives as in HPI, otherwise negative.   Past Medical History  Diagnosis Date  . History of chicken pox   . Hypertension   . Sclerodermia generalized   . Non Hodgkin's lymphoma     Treated with chemo 2010, felt to be cured.  . Melanoma 1999  . Peripheral arterial occlusive disease   . Neuropathic foot ulcer   . DOE (dyspnea on exertion)     Current Outpatient Prescriptions  Medication Sig Dispense Refill  . amitriptyline (ELAVIL) 25 MG tablet Take 25 mg by mouth at bedtime.      Marland Kitchen aspirin 81 MG tablet Take 81 mg by mouth daily.        Marland Kitchen atorvastatin (LIPITOR) 40 MG tablet Take 1 tablet by mouth Daily.      Marland Kitchen azelastine (ASTELIN) 137 MCG/SPRAY nasal spray Place 2 sprays into the nose daily. Use in each  nostril at bedtime      . BIOTIN PO Take 1 tablet by mouth daily at 12 noon.       . bosentan (TRACLEER) 125 MG tablet Take 125 mg by mouth 2 (two) times daily.      . Cholecalciferol (VITAMIN D) 2000 UNITS CAPS Take by mouth daily.        . citalopram (CELEXA) 20 MG tablet Take 1 tablet (20 mg total) by mouth daily.  30 tablet  11  . clopidogrel (PLAVIX) 75 MG tablet Take 75 mg by mouth daily.        . collagenase (SANTYL) ointment Apply 1 application topically 2 (two) times daily. To both legs.      . furosemide (LASIX) 40 MG tablet Take 1 tablet (40 mg total) by mouth 2 (two) times daily.  60 tablet  6  . gabapentin (NEURONTIN) 300 MG capsule One tablet in the morning and two  tablets at night      . mometasone (NASONEX) 50 MCG/ACT nasal spray Place 2 sprays into the nose daily.  17 g  6  . multivitamin (THERAGRAN) per tablet Take 1 tablet by mouth daily.        Marland Kitchen NEXIUM 40 MG capsule Take 1 tablet by mouth Daily.      Marland Kitchen omega-3 acid ethyl esters (LOVAZA) 1 G capsule Take 1 g by mouth See admin instructions. 1 g cap at noon and then 2 capsules at bedtime      . potassium chloride SA (K-DUR,KLOR-CON) 20 MEQ tablet Take 1 tablet (20 mEq total) by mouth 2 (two) times daily.  60 tablet  6  . Probiotic Product (PROBIOTIC FORMULA PO) Take 1 capsule by mouth daily.      . sildenafil (REVATIO) 20 MG tablet Take 1 tablet (20 mg total) by mouth 3 (three) times daily.  90 tablet  6  . spironolactone (ALDACTONE) 25 MG tablet Take 25 mg by mouth daily.       . vitamin B-12 (CYANOCOBALAMIN) 500 MCG tablet Take 500 mcg by mouth 2 (two) times daily.         Allergies  Allergen Reactions  . Sulfa Antibiotics     Severe rash with significant peeling of face. No airway involvement per patient  . Penicillins Rash    PHYSICAL EXAM: Filed Vitals:   07/29/12 1351  BP: 116/69  Pulse: 90  Height: _0  (1.727 m)  Weight: 148 lb 6.4 oz (67.314 kg)    General: No acute distress No respiratory difficulty HEENT: normal Neck: supple. JVP 5-6. Carotids 2+ bilat; no bruits. No lymphadenopathy or thryomegaly appreciated. Cor: PMI nondisplaced. Regular rate & rhythm. Prominent P2. Soft TR murmur. No obvious RV lift Lungs: clear on 3 liters [place] Abdomen: soft, nontender, nondistended. No hepatosplenomegaly. No bruits or masses. Good bowel sounds. 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 + gouty nodules. LEs wrapped due to ulcers. trace edema to thighs Neuro: alert & oriented x 3, cranial nerves grossly intact. moves all 4 extremities w/o difficulty. Affect pleasant.    ASSESSMENT & PLAN:

## 2012-07-29 NOTE — Patient Instructions (Addendum)
Check weight daily.  If weight is stable at 148 pounds take lasix 40 mg daily.  If weight increasing 150 pounds take 40 mg twice daily.  If weight continues to drop or you become dizzy hold lasix that day.   If weight stable around 148 pounds in 8-10 days then increase revatio 40 mg (2 tabs) three times daily.    Labs next week.   Follow up 3-4 weeks with Dr. [name].

## 2012-07-30 ENCOUNTER — Telehealth: Payer: Self-pay | Admitting: *Deleted

## 2012-07-30 ENCOUNTER — Encounter: Payer: Self-pay | Admitting: Vascular Surgery

## 2012-07-30 NOTE — Telephone Encounter (Signed)
07/30/2012  Time: 10:58 AM   Patient Name: [name] [name]  Patient of: T.F. Early  Procedure:Laser Ablation right greater saphenous vein 07-24-2012  Reached patient at home and checked  Her status  Yes    Comments/Actions Taken: [name] [name] reports last night a small amount of "pinkish" drainage at right ankle--site of large venous ulcer.  No further discharge today. She states she is having mild discomfort over right inner thigh that is relieved by Tylenol arthritis formula.  [name] [name] is unable to use Ibuprofen due to liver abnormalities. No complaints of swelling. Reviewed post procedural instructions with [name] [name] and reminded her of post laser ablation duplex and follow up appointment with Dr. [name] on 07-31-2012.      _0 _1 , Meliton Rattan

## 2012-07-31 ENCOUNTER — Encounter (INDEPENDENT_AMBULATORY_CARE_PROVIDER_SITE_OTHER): Payer: BC Managed Care – PPO | Admitting: *Deleted

## 2012-07-31 ENCOUNTER — Encounter: Payer: Self-pay | Admitting: Vascular Surgery

## 2012-07-31 ENCOUNTER — Ambulatory Visit (INDEPENDENT_AMBULATORY_CARE_PROVIDER_SITE_OTHER): Payer: BC Managed Care – PPO | Admitting: Vascular Surgery

## 2012-07-31 VITALS — BP 123/80 | HR 80 | Resp 16 | Ht 68.0 in | Wt 147.0 lb

## 2012-07-31 DIAGNOSIS — I83009 Varicose veins of unspecified lower extremity with ulcer of unspecified site: Secondary | ICD-10-CM

## 2012-07-31 DIAGNOSIS — L97909 Non-pressure chronic ulcer of unspecified part of unspecified lower leg with unspecified severity: Secondary | ICD-10-CM

## 2012-07-31 DIAGNOSIS — I83219 Varicose veins of right lower extremity with both ulcer of unspecified site and inflammation: Secondary | ICD-10-CM

## 2012-07-31 DIAGNOSIS — I83893 Varicose veins of bilateral lower extremities with other complications: Secondary | ICD-10-CM

## 2012-07-31 DIAGNOSIS — Z48812 Encounter for surgical aftercare following surgery on the circulatory system: Secondary | ICD-10-CM

## 2012-07-31 NOTE — Progress Notes (Signed)
The patient has today for one week followup of laser ablation of her right great saphenous vein. She had no immediate postoperative complications. She also has undergone left leg venous duplex today as well. She does have severe bilateral venous hypertension and severe venous ulceration. She had minimal discomfort and minimal bruising following the ablation.  Venous duplex today on the right leg reveals closure of her great saphenous vein from the proximal calf to mid thigh. She does continue to have patency from the mid thigh up to the saphenofemoral junction. There is no evidence of DVT. Her left leg reveals reflux in both her great and small saphenous vein and also entered the system as well. I discussed this at length with the patient. I will see her again in one month. She will continue her local wound care for venous ulceration. She would be a candidate for ablation of her left leg. I explained we would terminate her level of improvement with the right leg ablation. I did explain that are certainly we would've preferred to have ablation and closure of her vein caudally up to the saphenofemoral junction were treated if this can sometimes have incomplete closure. She is still having the benefit of the closure of the mid thigh to proximal calf saphenous vein. We will continue this discussion in one month

## 2012-08-06 ENCOUNTER — Telehealth: Payer: Self-pay | Admitting: Internal Medicine

## 2012-08-06 NOTE — Telephone Encounter (Signed)
Caller: [name] [name]/Patient; PCP: Benay Pillow; CB#: [phone]; ; ; Call regarding Neuropathy Flare; states ankles have been very tender.  States Dr. Arnoldo Morale mentioned that he may want to add cymbalta to her prescriptions, and she told him at that time she was doing all right, but feels she is ready to try adding cymbalta.  Per protocol, emergent symptoms denied; advised appointment within 24 hours.  Declines appointment; states discussed this with Dr. Arnoldo Morale and prefers he consider Rx.  Info to office for provider review/Rx/callback.  [facility] May reach patient [phone].

## 2012-08-07 ENCOUNTER — Other Ambulatory Visit: Payer: Self-pay | Admitting: *Deleted

## 2012-08-07 ENCOUNTER — Other Ambulatory Visit (INDEPENDENT_AMBULATORY_CARE_PROVIDER_SITE_OTHER): Payer: BC Managed Care – PPO

## 2012-08-07 ENCOUNTER — Encounter (HOSPITAL_BASED_OUTPATIENT_CLINIC_OR_DEPARTMENT_OTHER): Payer: BC Managed Care – PPO | Attending: Internal Medicine

## 2012-08-07 DIAGNOSIS — I7300 Raynaud's syndrome without gangrene: Secondary | ICD-10-CM | POA: Insufficient documentation

## 2012-08-07 DIAGNOSIS — I872 Venous insufficiency (chronic) (peripheral): Secondary | ICD-10-CM | POA: Insufficient documentation

## 2012-08-07 DIAGNOSIS — M349 Systemic sclerosis, unspecified: Secondary | ICD-10-CM | POA: Insufficient documentation

## 2012-08-07 DIAGNOSIS — I2789 Other specified pulmonary heart diseases: Secondary | ICD-10-CM

## 2012-08-07 DIAGNOSIS — I739 Peripheral vascular disease, unspecified: Secondary | ICD-10-CM | POA: Insufficient documentation

## 2012-08-07 DIAGNOSIS — L97809 Non-pressure chronic ulcer of other part of unspecified lower leg with unspecified severity: Secondary | ICD-10-CM | POA: Insufficient documentation

## 2012-08-07 LAB — BASIC METABOLIC PANEL
BUN: 20 mg/dL (ref 6–23)
CO2: 27 mEq/L (ref 19–32)
Calcium: 9.2 mg/dL (ref 8.4–10.5)
Chloride: 96 mEq/L (ref 96–112)
Creatinine, Ser: 1 mg/dL (ref 0.4–1.2)
GFR: 63.17 mL/min (ref >60.00)
Glucose, Bld: 116 mg/dL — ABNORMAL HIGH (ref 70–99)
Potassium: 4.8 mEq/L (ref 3.5–5.1)
Sodium: 134 mEq/L — ABNORMAL LOW (ref 135–145)

## 2012-08-07 MED ORDER — DULOXETINE HCL 30 MG PO CPEP: 30.0000 mg | ORAL_CAPSULE | Freq: Every day | ORAL | Status: DC

## 2012-08-07 NOTE — Telephone Encounter (Signed)
startcymbalta 30 qd

## 2012-08-08 ENCOUNTER — Other Ambulatory Visit: Payer: BC Managed Care – PPO

## 2012-08-12 ENCOUNTER — Other Ambulatory Visit: Payer: Self-pay | Admitting: Internal Medicine

## 2012-08-12 ENCOUNTER — Telehealth: Payer: Self-pay | Admitting: Internal Medicine

## 2012-08-12 ENCOUNTER — Other Ambulatory Visit (INDEPENDENT_AMBULATORY_CARE_PROVIDER_SITE_OTHER): Payer: BC Managed Care – PPO

## 2012-08-12 DIAGNOSIS — M549 Dorsalgia, unspecified: Secondary | ICD-10-CM

## 2012-08-12 LAB — POCT URINALYSIS DIPSTICK
Bilirubin, UA: NEGATIVE
Blood, UA: NEGATIVE
Glucose, UA: NEGATIVE
Ketones, UA: NEGATIVE
Leukocytes, UA: NEGATIVE
Nitrite, UA: NEGATIVE
Protein, UA: NEGATIVE
Spec Grav, UA: 1.01
Urobilinogen, UA: 0.2
pH, UA: 7.5

## 2012-08-12 NOTE — Telephone Encounter (Signed)
Coming for ua  This pm

## 2012-08-12 NOTE — Telephone Encounter (Signed)
Caller: [name] [name]/Patient; Patient Name: [name] [name]; PCP: Benay Pillow; [place] Phone Number: [phone]. Patient calling regarding lower back pain. Onset 08/09/12. Emergent symptom of "Mild to moderate pain in back with normal activity or rest and not responding to 72 hour of home care" positive per Back Symptoms guideline. Unable to find an appointment with Dr. Arnoldo Morale or Roxy Cedar, PA before the time period specified by the disposition after triage (72 hours). Please call the patient back if you are able to find an appointment for her or work her in. Thank you.

## 2012-08-13 ENCOUNTER — Encounter: Payer: Self-pay | Admitting: Internal Medicine

## 2012-08-13 ENCOUNTER — Ambulatory Visit (INDEPENDENT_AMBULATORY_CARE_PROVIDER_SITE_OTHER): Payer: BC Managed Care – PPO | Admitting: Internal Medicine

## 2012-08-13 ENCOUNTER — Ambulatory Visit (INDEPENDENT_AMBULATORY_CARE_PROVIDER_SITE_OTHER)
Admission: RE | Admit: 2012-08-13 | Discharge: 2012-08-13 | Disposition: A | Discharge status: Home / Self Care | Payer: BC Managed Care – PPO | Source: Ambulatory Visit | Attending: Internal Medicine | Admitting: Internal Medicine

## 2012-08-13 VITALS — BP 110/70 | HR 72 | Temp 98.6°F | Resp 16 | Ht 68.0 in | Wt 148.0 lb

## 2012-08-13 DIAGNOSIS — M533 Sacrococcygeal disorders, not elsewhere classified: Secondary | ICD-10-CM

## 2012-08-13 DIAGNOSIS — M549 Dorsalgia, unspecified: Secondary | ICD-10-CM

## 2012-08-13 MED ORDER — CELECOXIB 200 MG PO CAPS: 200.0000 mg | ORAL_CAPSULE | Freq: Every day | ORAL | Status: DC

## 2012-08-13 NOTE — Progress Notes (Signed)
Subjective:    [name], female    DOB: 1951-10-31, 61 y.o.   MRN: [id number]  HPI Patient is having sacroiliac pain radiating into the top of the buttocks bilaterally on both sides equal.  Began Saturday prior to her office visit today urinalysis which showed no leukocytes in the urine culture is pending she has a history of scleroderma peripheral artery occlusive disease and hypertension she is on Plavix Lipitor and Revatio for pulmonary hypertensiON Arthritis strength Tylenol seems to help  Review of Systems  Constitutional: Negative for activity change, appetite change and fatigue.  HENT: Negative for ear pain, congestion, neck pain, postnasal drip and sinus pressure.   Eyes: Negative for redness and visual disturbance.  Respiratory: Negative for cough, shortness of breath and wheezing.   Cardiovascular: Positive for leg swelling.  Gastrointestinal: Negative for abdominal pain and abdominal distention.  Genitourinary: Negative for dysuria, frequency and menstrual problem.  Musculoskeletal: Positive for myalgias, back pain, joint swelling and gait problem. Negative for arthralgias.  Skin: Negative for rash and wound.  Neurological: Negative for dizziness, weakness and headaches.  Hematological: Negative for adenopathy. Does not bruise/bleed easily.  Psychiatric/Behavioral: Negative for disturbed wake/sleep cycle and decreased concentration.   Past Medical History  Diagnosis Date  . History of chicken pox   . Hypertension   . Sclerodermia generalized   . Non Hodgkin's lymphoma     Treated with chemo 2010, felt to be cured.  . Melanoma 1999  . Peripheral arterial occlusive disease   . Neuropathic foot ulcer   . DOE (dyspnea on exertion)     History   Social History  . Marital Status: Married    Spouse Name: N/A    Number of Children: 1  . Years of Education: N/A   Occupational History  . homemaker    Social History Main Topics  . Smoking status:  Never Smoker   . Smokeless tobacco: Never Used  . Alcohol Use: Yes     couple of times a week  . Drug Use: No  . Sexually Active: Not Currently   Other Topics Concern  . Not on file   Social History Narrative  . No narrative on file    Past Surgical History  Procedure Date  . Rt little toe removal 10/1998  . Stent left thigh 3/11  . Tonsillectomy and adenoidectomy   . Small intestine surgery   . Biopsy on cerebellum   . Vein ligation and stripping 05/16/2012    Procedure: VEIN LIGATION AND STRIPPING;  Surgeon: [name], MD;  Location: [facility] OR;  Service: Vascular;  Laterality: Right;  Irrigation and Debridement right lower leg, ligation of saphenous vein.  . Endovenous ablation saphenous vein w/ laser 07-24-2012    right greater saphenous vein by [name], M.D.     Family History  Problem Relation Age of Onset  . Lupus Father   . Lymphoma Father   . Hyperlipidemia Mother   . Cancer Daughter     sarcoma  . Colon cancer      Allergies  Allergen Reactions  . Sulfa Antibiotics     Severe rash with significant peeling of face. No airway involvement per patient  . Penicillins Rash    Current Outpatient Prescriptions on File Prior to Visit  Medication Sig Dispense Refill  . amitriptyline (ELAVIL) 25 MG tablet Take 25 mg by mouth at bedtime.      Marland Kitchen aspirin 81 MG tablet Take 81 mg by mouth  daily.        . atorvastatin (LIPITOR) 40 MG tablet Take 1 tablet by mouth Daily.      Marland Kitchen azelastine (ASTELIN) 137 MCG/SPRAY nasal spray Place 2 sprays into the nose daily. Use in each nostril at bedtime      . BIOTIN PO Take 1 tablet by mouth daily at 12 noon.       . bosentan (TRACLEER) 125 MG tablet Take 125 mg by mouth 2 (two) times daily.      . Cholecalciferol (VITAMIN D) 2000 UNITS CAPS Take by mouth daily.        . clopidogrel (PLAVIX) 75 MG tablet Take 75 mg by mouth daily.        . collagenase (SANTYL) ointment Apply 1 application topically 2 (two) times daily. To both legs.       . DULoxetine (CYMBALTA) 30 MG capsule Take 1 capsule (30 mg total) by mouth daily.  30 capsule  3  . furosemide (LASIX) 40 MG tablet Take 1 tablet (40 mg total) by mouth 2 (two) times daily.  60 tablet  6  . gabapentin (NEURONTIN) 300 MG capsule One tablet in the morning and two tablets at night      . mometasone (NASONEX) 50 MCG/ACT nasal spray Place 2 sprays into the nose daily.  17 g  6  . multivitamin (THERAGRAN) per tablet Take 1 tablet by mouth daily.        Marland Kitchen NEXIUM 40 MG capsule Take 1 tablet by mouth Daily.      Marland Kitchen omega-3 acid ethyl esters (LOVAZA) 1 G capsule Take 1 g by mouth See admin instructions. 1 g cap at noon and then 2 capsules at bedtime      . potassium chloride SA (K-DUR,KLOR-CON) 20 MEQ tablet Take 20 mEq by mouth daily.      . Probiotic Product (PROBIOTIC FORMULA PO) Take 1 capsule by mouth daily.      . sildenafil (REVATIO) 20 MG tablet Take 2 tablets (40 mg total) by mouth 3 (three) times daily.  180 tablet  2  . spironolactone (ALDACTONE) 25 MG tablet Take 25 mg by mouth daily.       . vitamin B-12 (CYANOCOBALAMIN) 500 MCG tablet Take 500 mcg by mouth 2 (two) times daily.       Marland Kitchen DISCONTD: potassium chloride SA (K-DUR,KLOR-CON) 20 MEQ tablet Take 1 tablet (20 mEq total) by mouth 2 (two) times daily.  60 tablet  6    BP 110/70  Pulse 72  Temp 98.6 F (37 C)  Resp 16  Ht _0  (1.727 m)  Wt 148 lb (67.132 kg)  BMI 22.50 kg/m2        Objective:   Physical Exam  Nursing note and vitals reviewed. Constitutional: She is oriented to person, place, and time. She appears well-developed and well-nourished. No distress.  HENT:  Head: Normocephalic and atraumatic.  Right Ear: External ear normal.  Left Ear: External ear normal.  Nose: Nose normal.  Mouth/Throat: Oropharynx is clear and moist.  Eyes: Conjunctivae and EOM are normal. Pupils are equal, round, and reactive to light.  Neck: Normal range of motion. Neck supple. No JVD present. No tracheal deviation  present. No thyromegaly present.  Cardiovascular: Normal rate and regular rhythm.   Murmur heard. Pulmonary/Chest: Effort normal. She has wheezes. She exhibits no tenderness.  Abdominal: Soft. Bowel sounds are normal.  Musculoskeletal: She exhibits edema and tenderness.  Lymphadenopathy:    She has no cervical  adenopathy.  Neurological: She is alert and oriented to person, place, and time. She has normal reflexes. No cranial nerve deficit.  Skin: Skin is warm and dry. She is not diaphoretic.  Psychiatric: She has a normal mood and affect. Her behavior is normal.          Assessment & Plan:  Obtain plain films today to make sure that there is no loss of vertebral height suggesting compression fracture since she has extreme risk for that given her other diagnoses and medication that she takes a trial of Celebrex 200 mg one by mouth daily for anti-inflammatory effect 4 were waiting the results of the x-rays as well as the pending urine culture to make sure there is no urine infection

## 2012-08-14 LAB — URINE CULTURE: Colony Count: 2000

## 2012-08-15 ENCOUNTER — Telehealth (HOSPITAL_COMMUNITY): Payer: Self-pay | Admitting: *Deleted

## 2012-08-15 NOTE — Telephone Encounter (Signed)
Opened in error

## 2012-08-19 ENCOUNTER — Encounter (HOSPITAL_COMMUNITY): Payer: BC Managed Care – PPO

## 2012-08-19 ENCOUNTER — Telehealth (HOSPITAL_COMMUNITY): Payer: Self-pay | Admitting: Cardiology

## 2012-08-19 NOTE — Telephone Encounter (Signed)
Note pt called to see if this was done, if not please call in the [facility] Call pt when this is done  thanks

## 2012-08-19 NOTE — Telephone Encounter (Signed)
Pt called to request that Rx of Revaito be sent to medication assistance program [facility].  Last Rx was sent to [facility] and pt is unable to afford. Please send in 12m strength.  [facility] [phone] [phone] Thanks!

## 2012-08-19 NOTE — Telephone Encounter (Signed)
Called in rx to [facility], left mess on pt's VM

## 2012-08-22 ENCOUNTER — Ambulatory Visit (HOSPITAL_COMMUNITY)
Admission: RE | Admit: 2012-08-22 | Discharge: 2012-08-22 | Disposition: A | Discharge status: Home / Self Care | Payer: BC Managed Care – PPO | Source: Ambulatory Visit | Attending: Internal Medicine | Admitting: Internal Medicine

## 2012-08-22 ENCOUNTER — Encounter (HOSPITAL_COMMUNITY): Payer: Self-pay

## 2012-08-22 VITALS — BP 100/60 | HR 80 | Ht 66.0 in | Wt 148.8 lb

## 2012-08-22 DIAGNOSIS — I2789 Other specified pulmonary heart diseases: Secondary | ICD-10-CM | POA: Insufficient documentation

## 2012-08-22 DIAGNOSIS — R69 Illness, unspecified: Secondary | ICD-10-CM

## 2012-08-22 DIAGNOSIS — I2729 Other secondary pulmonary hypertension: Secondary | ICD-10-CM

## 2012-08-22 NOTE — Assessment & Plan Note (Signed)
Improving slowly but steadily now on combination therapy. Will continue current regimen for now. Will check f/u echo and will need repeat 6MW (with forehead or ear probe) in the near future when she is able. Can stop potassium. Will recheck labs next week. If lasix not working as well can consider change to demadex as needed. Will see back in 1 month with plan to titrate Revatio 60 TID. Discussed need for possible RHC in future pending results of echo.

## 2012-08-22 NOTE — Patient Instructions (Signed)
Please stop taking Potassium. We will recheck your labs in one week at [place] has requested that you have an echocardiogram. Echocardiography is a painless test that uses sound waves to create images of your heart. It provides your doctor with information about the size and shape of your heart and how well your heart's chambers and valves are working. This procedure takes approximately one hour. There are no restrictions for this procedure.  Your physician recommends that you schedule a follow-up appointment in: 1 month

## 2012-08-22 NOTE — Progress Notes (Signed)
Primary Care: Dr. Benay Pillow Primary Cardiologist: Dr. [name] Pulmonologist: Dr. Gwenette Greet  HPI: [name] is a 61 y.o. 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  The patient has been treated with tracleer and revatio for approximately one to 2 years, and then tells me the medications were stopped because the echo "looked good".  Initially was in [place] then moved to [place] for 2 years. Has been in [place] for 1 year.   With regard to her scleroderma.  Has been following with Dr. Ricky Stabs in [place]. Prednisone was stopped several years ago and has not been anything for her scleroderma since that time.  Diagnosed with PAH in 2007 in [place]. She did have a right heart catheterization by her history for verification. The patient has been treated with cellcept, tracleer and revatio for approximately one to 2 years, and then tells me the medications were stopped in [place] because the echo "looked good".  I do not have her old records available for review. Apparently she was followed very closely with serial echocardiograms that remained fairly normal, and therefore she was left off medications. She had a recent echo last month in [place] as a f/u for her chemotherapy. LV EF 65-70%. RV severely enlarged/HK severe TR. pRVSP 115-120. She saw Dr. Gwenette Greet 2-3 weeks ago and referred here for [facility]. Also had CXR which showed RML PNA and treated with abx. No evidence of pulmonary fibrosis.   Underwent RHC on 4/19 as below:  RA = 9  RV = 75/11/14  PA = 71/25 (44)  PCW = 4  Fick cardiac output/index = 3.0/1.7  Thermo CO/CI = 2.7/1.5  PVR = 23.5 [place]  FA sat = 79% on room air (checked 3 times). Sat with finger probe on air 94%  PA sat = 41%, 43%   6 MW 04/18/12 880 feet   Last visit 7/30. Lasix recently increased to 40 bid and edema much improved. Weight now running 142-147.   Knows how to use of sliding scale lasix to keep home weight stable. Revatio increase 2 weeks ago to 40 TID. Tolerating well. Feels functional capacity is limited mostly be LE wounds. Saw wound clinic yesterday with debridement and they felt they were granulating well. No syncope/presyncope. LFTs have been fine.    Review of Systems: All pertinent positives and negatives as in HPI, otherwise negative.   Past Medical History  Diagnosis Date  . History of chicken pox   . Hypertension   . Sclerodermia generalized   . Non Hodgkin's lymphoma     Treated with chemo 2010, felt to be cured.  . Melanoma 1999  . Peripheral arterial occlusive disease   . Neuropathic foot ulcer   . DOE (dyspnea on exertion)     Current Outpatient Prescriptions  Medication Sig Dispense Refill  . amitriptyline (ELAVIL) 25 MG tablet Take 25 mg by mouth at bedtime.      Marland Kitchen aspirin 81 MG tablet Take 81 mg by mouth daily.        Marland Kitchen atorvastatin (LIPITOR) 40 MG tablet Take 1 tablet by mouth Daily.      Marland Kitchen azelastine (ASTELIN) 137 MCG/SPRAY nasal spray Place 2 sprays into the nose daily. Use in each nostril at bedtime      . BIOTIN PO Take 1 tablet by mouth daily at 12 noon.       . bosentan (TRACLEER) 125 MG tablet  Take 125 mg by mouth 2 (two) times daily.      . celecoxib (CELEBREX) 200 MG capsule Take 1 capsule (200 mg total) by mouth daily.      . Cholecalciferol (VITAMIN D) 2000 UNITS CAPS Take by mouth daily.        . clopidogrel (PLAVIX) 75 MG tablet Take 75 mg by mouth daily.        . collagenase (SANTYL) ointment Apply 1 application topically 2 (two) times daily. To both legs.      . DULoxetine (CYMBALTA) 30 MG capsule Take 1 capsule (30 mg total) by mouth daily.  30 capsule  3  . furosemide (LASIX) 40 MG tablet Take 1 tablet (40 mg total) by mouth 2 (two) times daily.  60 tablet  6  . gabapentin (NEURONTIN) 300 MG capsule One tablet in the morning and two tablets at night      . mometasone (NASONEX) 50 MCG/ACT  nasal spray Place 2 sprays into the nose daily.  17 g  6  . multivitamin (THERAGRAN) per tablet Take 1 tablet by mouth daily.        Marland Kitchen NEXIUM 40 MG capsule Take 1 tablet by mouth Daily.      Marland Kitchen omega-3 acid ethyl esters (LOVAZA) 1 G capsule Take 1 g by mouth See admin instructions. 1 g cap at noon and then 2 capsules at bedtime      . potassium chloride SA (K-DUR,KLOR-CON) 20 MEQ tablet Take 20 mEq by mouth daily.      . Probiotic Product (PROBIOTIC FORMULA PO) Take 1 capsule by mouth daily.      . sildenafil (REVATIO) 20 MG tablet Take 2 tablets (40 mg total) by mouth 3 (three) times daily.  180 tablet  2  . spironolactone (ALDACTONE) 25 MG tablet Take 25 mg by mouth daily.       . vitamin B-12 (CYANOCOBALAMIN) 500 MCG tablet Take 500 mcg by mouth 2 (two) times daily.         Allergies  Allergen Reactions  . Sulfa Antibiotics     Severe rash with significant peeling of face. No airway involvement per patient  . Penicillins Rash    PHYSICAL EXAM: Filed Vitals:   08/22/12 1131  BP: 100/60  Pulse: 80  Height: _0  (1.676 m)  Weight: 148 lb 12.8 oz (67.495 kg)    General: No acute distress No respiratory difficulty HEENT: normal Neck: supple. JVP 5-6. Carotids 2+ bilat; no bruits. No lymphadenopathy or thryomegaly appreciated. Cor: PMI nondisplaced. Regular rate & rhythm. Prominent P2. Soft TR murmur. No obvious RV lift Lungs: clear on 3 liters [place] Abdomen: soft, nontender, nondistended. No hepatosplenomegaly. No bruits or masses. Good bowel sounds. 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 + gouty nodules. LEs wrapped due to ulcers. trace - 1+ edema to thighs Neuro: alert & oriented x 3, cranial nerves grossly intact. moves all 4 extremities w/o difficulty. Affect pleasant.    ASSESSMENT & PLAN:

## 2012-08-25 ENCOUNTER — Encounter: Payer: Self-pay | Admitting: Vascular Surgery

## 2012-08-26 ENCOUNTER — Telehealth: Payer: Self-pay | Admitting: Internal Medicine

## 2012-08-26 ENCOUNTER — Ambulatory Visit (INDEPENDENT_AMBULATORY_CARE_PROVIDER_SITE_OTHER): Payer: BC Managed Care – PPO | Admitting: Vascular Surgery

## 2012-08-26 ENCOUNTER — Telehealth (HOSPITAL_COMMUNITY): Payer: Self-pay | Admitting: Cardiology

## 2012-08-26 ENCOUNTER — Encounter: Payer: Self-pay | Admitting: Vascular Surgery

## 2012-08-26 VITALS — BP 117/61 | HR 77 | Resp 16 | Ht 66.0 in | Wt 150.0 lb

## 2012-08-26 DIAGNOSIS — M549 Dorsalgia, unspecified: Secondary | ICD-10-CM

## 2012-08-26 DIAGNOSIS — I83893 Varicose veins of bilateral lower extremities with other complications: Secondary | ICD-10-CM

## 2012-08-26 DIAGNOSIS — M533 Sacrococcygeal disorders, not elsewhere classified: Secondary | ICD-10-CM

## 2012-08-26 DIAGNOSIS — I5032 Chronic diastolic (congestive) heart failure: Secondary | ICD-10-CM

## 2012-08-26 MED ORDER — CELECOXIB 200 MG PO CAPS: 200.0000 mg | ORAL_CAPSULE | Freq: Every day | ORAL | Status: DC

## 2012-08-26 NOTE — Telephone Encounter (Signed)
Spoke w/pt she is having some edema and feels like her abd is bloated, she states her wt has gradually drifted up over last several days and is 147 she is normally around 142, per last ov note ? Change lasix to demadex if needed, pt doesn't think she wants to change meds now since she is going out of town on Friday, advised ok to take extra lasix today and will discuss w/Dr [employer] tomorrow

## 2012-08-26 NOTE — Telephone Encounter (Signed)
done

## 2012-08-26 NOTE — Progress Notes (Signed)
Patient presents today for continued followup of her extensive venous ulceration bilaterally. She has been treated at the wound center and reports she is having continued improvement. She has a multi-layer dressing on both legs and did not remove these today. She does continue to have significant swelling and discomfort with prolonged standing. She did undergo a venous duplex in our office on 07/31/2012 regarding her left leg. She does have significant reflux throughout her left great saphenous vein.  Impression and plan: Extensive bilateral venous stasis ulceration resistant to conservative treatment. She has undergone ablation of her right great saphenous vein. I would recommend ablation of her left great saphenous vein to improve her venous hypertension. She did quite well with her initial procedure on the right leg and wished to proceed with left leg as soon as possible

## 2012-08-26 NOTE — Telephone Encounter (Signed)
Caller: Shivangi/Patient; Patient Name: [name] [name]; PCP: Benay Pillow (Adults only); [facility] Phone Number: [phone].  Call regarding Celebrex prescription needed for Ankle and Back Pain. Denies Back pain at time of triage. Patient has Sacroiliac Joint Pain diagnosed on 8-14.   Patient was given sample at office.  Patient will run out on 8-29. All emergent symptoms ruled out per Foot non-injury protocol, see in 2 weeks.  Patient is going good on Celebrex, uses [facility], [place], [phone].

## 2012-08-26 NOTE — Telephone Encounter (Signed)
Feeling bloated. Would like to know if she can double her dose of Lasix. Currently taking Lavito two tabs TID and Lasix two tabs BID

## 2012-08-27 NOTE — Telephone Encounter (Signed)
Per Dr [name] increase Lasix to 80 mg bid and restart KCL 20 daily repeat labs when she [place] from the beach, pt is aware and agreeable, will have repeat labs 9/10

## 2012-08-28 ENCOUNTER — Other Ambulatory Visit: Payer: BC Managed Care – PPO

## 2012-08-28 ENCOUNTER — Telehealth: Payer: Self-pay | Admitting: Internal Medicine

## 2012-08-28 DIAGNOSIS — M549 Dorsalgia, unspecified: Secondary | ICD-10-CM

## 2012-08-28 DIAGNOSIS — I2729 Other secondary pulmonary hypertension: Secondary | ICD-10-CM

## 2012-08-28 DIAGNOSIS — M533 Sacrococcygeal disorders, not elsewhere classified: Secondary | ICD-10-CM

## 2012-08-28 LAB — BASIC METABOLIC PANEL WITH GFR
BUN: 31 mg/dL — ABNORMAL HIGH (ref 6–23)
CO2: 26 meq/L (ref 19–32)
Calcium: 9.3 mg/dL (ref 8.4–10.5)
Chloride: 97 meq/L (ref 96–112)
Creatinine, Ser: 1 mg/dL (ref 0.4–1.2)
GFR: 56.9 mL/min — ABNORMAL LOW (ref >60.00)
Glucose, Bld: 86 mg/dL (ref 70–99)
Potassium: 4.8 meq/L (ref 3.5–5.1)
Sodium: 136 meq/L (ref 135–145)

## 2012-08-28 NOTE — Telephone Encounter (Signed)
Pt states pharmacy did not receive rx for celecoxib (CELEBREX) 200 MG capsule. Pt requesting to have rx re-sent to pharmacy  [facility]

## 2012-08-29 ENCOUNTER — Telehealth: Payer: Self-pay | Admitting: Internal Medicine

## 2012-08-29 MED ORDER — CELECOXIB 200 MG PO CAPS: 200.0000 mg | ORAL_CAPSULE | Freq: Every day | ORAL | Status: DC

## 2012-08-29 NOTE — Telephone Encounter (Signed)
Pt called and said that [facility] on [address] still does not have the script for celecoxib (CELEBREX) 200 MG capsule. Pls call pharmacy directly.

## 2012-09-02 ENCOUNTER — Other Ambulatory Visit: Payer: Self-pay | Admitting: *Deleted

## 2012-09-02 ENCOUNTER — Ambulatory Visit: Payer: BC Managed Care – PPO | Admitting: Vascular Surgery

## 2012-09-02 DIAGNOSIS — I83219 Varicose veins of right lower extremity with both ulcer of unspecified site and inflammation: Secondary | ICD-10-CM

## 2012-09-02 DIAGNOSIS — I83229 Varicose veins of left lower extremity with both ulcer of unspecified site and inflammation: Secondary | ICD-10-CM

## 2012-09-02 NOTE — Telephone Encounter (Signed)
Called the pharmacy and pt's medication has been called in.

## 2012-09-08 ENCOUNTER — Other Ambulatory Visit (INDEPENDENT_AMBULATORY_CARE_PROVIDER_SITE_OTHER): Payer: BC Managed Care – PPO

## 2012-09-08 DIAGNOSIS — I2789 Other specified pulmonary heart diseases: Secondary | ICD-10-CM

## 2012-09-08 DIAGNOSIS — I5032 Chronic diastolic (congestive) heart failure: Secondary | ICD-10-CM

## 2012-09-08 LAB — HEPATIC FUNCTION PANEL
ALT: 18 U/L (ref 0–35)
AST: 29 U/L (ref 0–37)
Albumin: 3.8 g/dL (ref 3.5–5.2)
Alkaline Phosphatase: 130 U/L — ABNORMAL HIGH (ref 39–117)
Bilirubin, Direct: 0.2 mg/dL (ref 0.0–0.3)
Total Bilirubin: 0.7 mg/dL (ref 0.3–1.2)
Total Protein: 7.2 g/dL (ref 6.0–8.3)

## 2012-09-08 LAB — BASIC METABOLIC PANEL
BUN: 42 mg/dL — ABNORMAL HIGH (ref 6–23)
CO2: 27 mEq/L (ref 19–32)
Calcium: 9.1 mg/dL (ref 8.4–10.5)
Chloride: 95 mEq/L — ABNORMAL LOW (ref 96–112)
Creatinine, Ser: 1.4 mg/dL — ABNORMAL HIGH (ref 0.4–1.2)
GFR: 41.05 mL/min — ABNORMAL LOW (ref >60.00)
Glucose, Bld: 94 mg/dL (ref 70–99)
Potassium: 4.6 mEq/L (ref 3.5–5.1)
Sodium: 135 mEq/L (ref 135–145)

## 2012-09-11 ENCOUNTER — Encounter (HOSPITAL_BASED_OUTPATIENT_CLINIC_OR_DEPARTMENT_OTHER): Payer: BC Managed Care – PPO | Attending: Internal Medicine

## 2012-09-11 ENCOUNTER — Encounter (HOSPITAL_BASED_OUTPATIENT_CLINIC_OR_DEPARTMENT_OTHER): Payer: BC Managed Care – PPO

## 2012-09-11 ENCOUNTER — Telehealth (HOSPITAL_COMMUNITY): Payer: Self-pay | Admitting: Cardiology

## 2012-09-11 DIAGNOSIS — M349 Systemic sclerosis, unspecified: Secondary | ICD-10-CM | POA: Insufficient documentation

## 2012-09-11 DIAGNOSIS — I739 Peripheral vascular disease, unspecified: Secondary | ICD-10-CM | POA: Insufficient documentation

## 2012-09-11 DIAGNOSIS — L97809 Non-pressure chronic ulcer of other part of unspecified lower leg with unspecified severity: Secondary | ICD-10-CM | POA: Insufficient documentation

## 2012-09-11 DIAGNOSIS — I872 Venous insufficiency (chronic) (peripheral): Secondary | ICD-10-CM | POA: Insufficient documentation

## 2012-09-11 DIAGNOSIS — I10 Essential (primary) hypertension: Secondary | ICD-10-CM

## 2012-09-11 DIAGNOSIS — I7300 Raynaud's syndrome without gangrene: Secondary | ICD-10-CM | POA: Insufficient documentation

## 2012-09-11 MED ORDER — FUROSEMIDE 40 MG PO TABS: 80.0000 mg | ORAL_TABLET | Freq: Two times a day (BID) | ORAL | Status: DC

## 2012-09-11 NOTE — Telephone Encounter (Signed)
Pt called to request a refill on meds as she is out early since recent increase in dose.. meds sent to pharm

## 2012-09-12 ENCOUNTER — Other Ambulatory Visit: Payer: BC Managed Care – PPO

## 2012-09-16 ENCOUNTER — Encounter: Payer: Self-pay | Admitting: Internal Medicine

## 2012-09-16 ENCOUNTER — Ambulatory Visit (INDEPENDENT_AMBULATORY_CARE_PROVIDER_SITE_OTHER): Payer: BC Managed Care – PPO | Admitting: Internal Medicine

## 2012-09-16 VITALS — BP 126/60 | HR 84 | Temp 97.6°F | Resp 20 | Ht 66.0 in | Wt 148.0 lb

## 2012-09-16 DIAGNOSIS — L940 Localized scleroderma [morphea]: Secondary | ICD-10-CM

## 2012-09-16 DIAGNOSIS — I10 Essential (primary) hypertension: Secondary | ICD-10-CM

## 2012-09-16 DIAGNOSIS — G5790 Unspecified mononeuropathy of unspecified lower limb: Secondary | ICD-10-CM

## 2012-09-16 DIAGNOSIS — G5793 Unspecified mononeuropathy of bilateral lower limbs: Secondary | ICD-10-CM

## 2012-09-16 DIAGNOSIS — Z23 Encounter for immunization: Secondary | ICD-10-CM

## 2012-09-16 MED ORDER — TAPENTADOL HCL ER 100 MG PO TB12: 1.0000 | ORAL_TABLET | Freq: Two times a day (BID) | ORAL | Status: DC

## 2012-09-16 MED ORDER — SPIRONOLACTONE 25 MG PO TABS: 25.0000 mg | ORAL_TABLET | Freq: Every day | ORAL | Status: DC

## 2012-09-16 NOTE — Progress Notes (Signed)
Subjective:    [name], female    DOB: [birth year], 61 y.o.   MRN: [id number]  HPI Patient is a 61 year old female with a history of scleroderma peripheral arterial occlusive disease who is followed by Dr. early and vascular surgery standpoint.  She presents today with a chief complaint of significant increased pain in the lower extremities and feet she has been on tramadol and Celebrex without relief of the neuropathic pain. She was not able to tolerate Lyrica but has tolerated gabapentin and she is on 900 mg a day (in split dosing)  She is oxygen dependent and is on  rivatio for pulmonary hypertension.     Review of Systems  Constitutional: Negative for activity change, appetite change and fatigue.  HENT: Negative for ear pain, congestion, neck pain, postnasal drip and sinus pressure.   Eyes: Negative for redness and visual disturbance.  Respiratory: Negative for cough, shortness of breath and wheezing.   Gastrointestinal: Negative for abdominal pain and abdominal distention.  Genitourinary: Negative for dysuria, frequency and menstrual problem.  Musculoskeletal: Negative for myalgias, joint swelling and arthralgias.  Skin: Negative for rash and wound.  Neurological: Negative for dizziness, weakness and headaches.  Hematological: Negative for adenopathy. Does not bruise/bleed easily.  Psychiatric/Behavioral: Negative for disturbed wake/sleep cycle and decreased concentration.   Past Medical History  Diagnosis Date  . History of chicken pox   . Hypertension   . Sclerodermia generalized   . Non Hodgkin's lymphoma     Treated with chemo 2010, felt to be cured.  . Melanoma 1999  . Peripheral arterial occlusive disease   . Neuropathic foot ulcer   . DOE (dyspnea on exertion)     History   Social History  . Marital Status: Married    Spouse Name: N/A    Number of Children: 1  . Years of Education: N/A   Occupational History  . homemaker    Social  History Main Topics  . Smoking status: Never Smoker   . Smokeless tobacco: Never Used  . Alcohol Use: Yes     couple of times a week  . Drug Use: No  . Sexually Active: Not Currently   Other Topics Concern  . Not on file   Social History Narrative  . No narrative on file    Past Surgical History  Procedure Date  . Rt little toe removal 10/1998  . Stent left thigh 3/11  . Tonsillectomy and adenoidectomy   . Small intestine surgery   . Biopsy on cerebellum   . Vein ligation and stripping 05/16/2012    Procedure: VEIN LIGATION AND STRIPPING;  Surgeon: [name], MD;  Location: [facility] OR;  Service: Vascular;  Laterality: Right;  Irrigation and Debridement right lower leg, ligation of saphenous vein.  . Endovenous ablation saphenous vein w/ laser 07-24-2012    right greater saphenous vein by [name], M.D.     Family History  Problem Relation Age of Onset  . Lupus Father   . Lymphoma Father   . Hyperlipidemia Mother   . Cancer Daughter     sarcoma  . Colon cancer      Allergies  Allergen Reactions  . Sulfa Antibiotics     Severe rash with significant peeling of face. No airway involvement per patient  . Penicillins Rash    Current Outpatient Prescriptions on File Prior to Visit  Medication Sig Dispense Refill  . amitriptyline (ELAVIL) 25 MG tablet Take 25 mg by mouth at  bedtime.      Marland Kitchen aspirin 81 MG tablet Take 81 mg by mouth daily.        Marland Kitchen atorvastatin (LIPITOR) 40 MG tablet Take 1 tablet by mouth Daily.      Marland Kitchen BIOTIN PO Take 1 tablet by mouth daily at 12 noon.       . bosentan (TRACLEER) 125 MG tablet Take 125 mg by mouth 2 (two) times daily.      . Cholecalciferol (VITAMIN D) 2000 UNITS CAPS Take by mouth daily.        . clopidogrel (PLAVIX) 75 MG tablet Take 75 mg by mouth daily.        . collagenase (SANTYL) ointment Apply 1 application topically 2 (two) times daily. To both legs.      . DULoxetine (CYMBALTA) 30 MG capsule Take 1 capsule (30 mg total) by mouth  daily.  30 capsule  3  . furosemide (LASIX) 40 MG tablet Take 2 tablets (80 mg total) by mouth 2 (two) times daily.  60 tablet  6  . gabapentin (NEURONTIN) 300 MG capsule One tablet in the morning and two tablets at night      . multivitamin (THERAGRAN) per tablet Take 1 tablet by mouth daily.        Marland Kitchen NEXIUM 40 MG capsule Take 1 tablet by mouth Daily.      Marland Kitchen omega-3 acid ethyl esters (LOVAZA) 1 G capsule Take 1 g by mouth See admin instructions. 1 g cap at noon and then 2 capsules at bedtime      . Probiotic Product (PROBIOTIC FORMULA PO) Take 1 capsule by mouth daily.      . sildenafil (REVATIO) 20 MG tablet Take 2 tablets (40 mg total) by mouth 3 (three) times daily.  180 tablet  2  . vitamin B-12 (CYANOCOBALAMIN) 500 MCG tablet Take 500 mcg by mouth 2 (two) times daily.       Marland Kitchen DISCONTD: spironolactone (ALDACTONE) 25 MG tablet Take 25 mg by mouth daily.         BP 126/60  Pulse 84  Temp 97.6 F (36.4 C)  Resp 20  Ht 5' 6" (1.676 m)  Wt 148 lb (67.132 kg)  BMI 23.89 kg/m2       Objective:   Physical Exam  Constitutional: She is oriented to person, place, and time. She appears well-developed and well-nourished.  HENT:  Head: Normocephalic and atraumatic.  Eyes: Conjunctivae normal are normal. Pupils are equal, round, and reactive to light.  Cardiovascular:  Murmur heard. Pulmonary/Chest: Effort normal. She has no rales.  Neurological: She is alert and oriented to person, place, and time.  Skin: Rash noted. There is erythema. There is pallor.   She is going to the wound center for her foot ulcers       Assessment & Plan:   Patient has significant neuropathic pain of her lower extremities she has tried and failed multiple medications including Lyrica, Ultram, and because of other comorbid problems a constipating opioid should be avoided if at all possible.  We will try nucynta and monitor her brother pain control and her side effects from the medications.   Narcotic  pain control is difficult due to the pulmonary hypertension in the medications she is on

## 2012-09-16 NOTE — Patient Instructions (Signed)
The patient is instructed to continue all medications as prescribed. Schedule followup with check out clerk upon leaving the clinic

## 2012-09-18 ENCOUNTER — Telehealth: Payer: Self-pay | Admitting: Internal Medicine

## 2012-09-18 MED ORDER — TAPENTADOL HCL ER 50 MG PO TB12: 50.0000 mg | ORAL_TABLET | Freq: Every morning | ORAL | Status: DC

## 2012-09-18 NOTE — Telephone Encounter (Signed)
Caller: [name]/Patient; Patient Name: [name] [name]; PCP: Benay Pillow (Adults only); Best Callback Phone Number: [phone]; Reason for call: Patient has been having pain in her ankles . She has been diagnosed with neuropathy from Chemotherap along with an Ulcer on her foot that is healing.  Undergoing wound therapy. She was prescribed- Nucynta ER 159m  on Tuesday 09/16/12.  She doing fine with the medication  at night but is unable to take during the day to sleepiness. She is taking it 9a & 9p but is just "zonked" during the day.  She is asking is there an option to do a lower dosage during the day.  She has noticed a difference in her pain level at night.   Advised that I would forward her concerns with the medication to Dr. JArnoldo Moraleto review. Caller expressed understanding. Advised to call back for questions, changes or concerns.

## 2012-09-19 NOTE — Telephone Encounter (Signed)
May give her 50 mg to use in day time

## 2012-09-19 NOTE — Telephone Encounter (Signed)
Left message for pt to call back  °

## 2012-09-19 NOTE — Telephone Encounter (Signed)
Pt aware

## 2012-09-29 ENCOUNTER — Ambulatory Visit (HOSPITAL_COMMUNITY): Payer: BC Managed Care – PPO

## 2012-10-02 ENCOUNTER — Encounter (HOSPITAL_BASED_OUTPATIENT_CLINIC_OR_DEPARTMENT_OTHER): Payer: BC Managed Care – PPO | Attending: Internal Medicine

## 2012-10-02 DIAGNOSIS — L97809 Non-pressure chronic ulcer of other part of unspecified lower leg with unspecified severity: Secondary | ICD-10-CM | POA: Insufficient documentation

## 2012-10-02 DIAGNOSIS — I872 Venous insufficiency (chronic) (peripheral): Secondary | ICD-10-CM | POA: Insufficient documentation

## 2012-10-02 DIAGNOSIS — M349 Systemic sclerosis, unspecified: Secondary | ICD-10-CM | POA: Insufficient documentation

## 2012-10-02 DIAGNOSIS — I8390 Asymptomatic varicose veins of unspecified lower extremity: Secondary | ICD-10-CM | POA: Insufficient documentation

## 2012-10-02 DIAGNOSIS — I739 Peripheral vascular disease, unspecified: Secondary | ICD-10-CM | POA: Insufficient documentation

## 2012-10-02 DIAGNOSIS — I658 Occlusion and stenosis of other precerebral arteries: Secondary | ICD-10-CM | POA: Insufficient documentation

## 2012-10-06 ENCOUNTER — Telehealth (HOSPITAL_COMMUNITY): Payer: Self-pay | Admitting: *Deleted

## 2012-10-06 ENCOUNTER — Encounter (HOSPITAL_COMMUNITY): Payer: Self-pay

## 2012-10-06 ENCOUNTER — Other Ambulatory Visit: Payer: Self-pay | Admitting: *Deleted

## 2012-10-06 ENCOUNTER — Other Ambulatory Visit (HOSPITAL_COMMUNITY): Payer: BC Managed Care – PPO

## 2012-10-06 ENCOUNTER — Ambulatory Visit (HOSPITAL_COMMUNITY)
Admission: RE | Admit: 2012-10-06 | Discharge: 2012-10-06 | Disposition: A | Discharge status: Home / Self Care | Payer: BC Managed Care – PPO | Source: Ambulatory Visit | Attending: Internal Medicine | Admitting: Internal Medicine

## 2012-10-06 ENCOUNTER — Ambulatory Visit (HOSPITAL_BASED_OUTPATIENT_CLINIC_OR_DEPARTMENT_OTHER)
Admission: RE | Admit: 2012-10-06 | Discharge: 2012-10-06 | Disposition: A | Discharge status: Home / Self Care | Payer: BC Managed Care – PPO | Source: Ambulatory Visit | Attending: Internal Medicine | Admitting: Internal Medicine

## 2012-10-06 VITALS — BP 122/60 | Ht 68.0 in | Wt 145.1 lb

## 2012-10-06 DIAGNOSIS — I2729 Other secondary pulmonary hypertension: Secondary | ICD-10-CM

## 2012-10-06 DIAGNOSIS — R69 Illness, unspecified: Secondary | ICD-10-CM

## 2012-10-06 DIAGNOSIS — I369 Nonrheumatic tricuspid valve disorder, unspecified: Secondary | ICD-10-CM

## 2012-10-06 DIAGNOSIS — I2789 Other specified pulmonary heart diseases: Secondary | ICD-10-CM

## 2012-10-06 MED ORDER — ESOMEPRAZOLE MAGNESIUM 40 MG PO CPDR
40.0000 mg | DELAYED_RELEASE_CAPSULE | Freq: Every day | ORAL | Status: DC
Start: 2012-10-06 — End: 2013-05-29

## 2012-10-06 MED ORDER — SILDENAFIL CITRATE 20 MG PO TABS: 80.0000 mg | ORAL_TABLET | Freq: Three times a day (TID) | ORAL | Status: DC

## 2012-10-06 NOTE — Progress Notes (Signed)
Primary Care: Dr. Benay Pillow Primary Cardiologist: Dr. [name] Pulmonologist: Dr. Gwenette Greet  HPI: [name] [name] is a 61 y.o. 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  The patient has been treated with tracleer and revatio for approximately one to 2 years, and then tells me the medications were stopped because the echo "looked good".  Initially was in [place] then moved to [place] for 2 years. Has been in [place] for 1 year.   With regard to her scleroderma.  Has been following with Dr. Ricky Stabs in [place]. Prednisone was stopped several years ago and has not been anything for her scleroderma since that time.  Diagnosed with PAH in 2007 in [place]. She did have a right heart catheterization by her history for verification. The patient has been treated with cellcept, tracleer and revatio for approximately one to 2 years, and then tells me the medications were stopped in [place] because the echo "looked good".  I do not have her old records available for review. Apparently she was followed very closely with serial echocardiograms that remained fairly normal, and therefore she was left off medications. She had a recent echo last month in [place] as a f/u for her chemotherapy. LV EF 65-70%. RV severely enlarged/HK severe TR. pRVSP 115-120. She saw Dr. Gwenette Greet 2-3 weeks ago and referred here for [place]. Also had CXR which showed RML PNA and treated with abx. No evidence of pulmonary fibrosis.   Underwent RHC on 4/19 as below:  RA = 9  RV = 75/11/14  PA = 71/25 (44)  PCW = 4  Fick cardiac output/index = 3.0/1.7  Thermo CO/CI = 2.7/1.5  PVR = 23.5 Woods  FA sat = 79% on room air (checked 3 times). Sat with finger probe on air 94%  PA sat = 41%, 43%   6 MW 04/18/12 880 feet   Last visit 7/30. Lasix increased to 40 bid a few months ago and edema much improved. Weight now running  142-147.  Knows how to use of sliding scale lasix to keep home weight stable. Only mild edema.  Revatio increased about 2 months ago to 40 TID. Also on Tracleer 125 bid Tolerating well. Feels like she is getting better. Functional capacity is limited mostly be LE wounds. Followed by wound clinic healing slowly. No syncope/presyncope. LFTs have been fine. At last increased O2 to 5L/min. 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  Echo reviewed today personally: LV EF 65-70%. RV severely enlarged/HK severe TR. pRVSP 115-120.  Review of Systems: All pertinent positives and negatives as in HPI, otherwise negative.   Past Medical History  Diagnosis Date  . History of chicken pox   . Hypertension   . Sclerodermia generalized   . Non Hodgkin's lymphoma     Treated with chemo 2010, felt to be cured.  . Melanoma 1999  . Peripheral arterial occlusive disease   . Neuropathic foot ulcer   . DOE (dyspnea on exertion)     Current Outpatient Prescriptions  Medication Sig Dispense Refill  . amitriptyline (ELAVIL) 25 MG tablet Take 25 mg by mouth at bedtime.      Marland Kitchen aspirin 81 MG tablet Take 81 mg by mouth daily.        Marland Kitchen atorvastatin (LIPITOR) 40 MG tablet Take 1 tablet by mouth Daily.      Marland Kitchen bosentan (TRACLEER) 125 MG tablet Take 125 mg by mouth 2 (  two) times daily.      . Cholecalciferol (VITAMIN D) 2000 UNITS CAPS Take by mouth daily.        . clopidogrel (PLAVIX) 75 MG tablet Take 75 mg by mouth daily.        . collagenase (SANTYL) ointment Apply 1 application topically 2 (two) times daily. To both legs.      . DULoxetine (CYMBALTA) 30 MG capsule Take 1 capsule (30 mg total) by mouth daily.  30 capsule  3  . furosemide (LASIX) 40 MG tablet Take 2 tablets (80 mg total) by mouth 2 (two) times daily.  60 tablet  6  . multivitamin (THERAGRAN) per tablet Take 1 tablet by mouth daily.        Marland Kitchen NEXIUM 40 MG capsule Take 1 tablet by mouth Daily.      Marland Kitchen omega-3 acid ethyl esters (LOVAZA) 1 G capsule Take 1  g by mouth See admin instructions. 1 g cap at noon and then 2 capsules at bedtime      . Probiotic Product (PROBIOTIC FORMULA PO) Take 1 capsule by mouth daily.      . sildenafil (REVATIO) 20 MG tablet Take 2 tablets (40 mg total) by mouth 3 (three) times daily.  180 tablet  2  . spironolactone (ALDACTONE) 25 MG tablet Take 1 tablet (25 mg total) by mouth daily.  30 tablet  6  . Tapentadol HCl 50 MG TB12 Take 50 mg by mouth every morning.  30 tablet  0  . vitamin B-12 (CYANOCOBALAMIN) 500 MCG tablet Take 500 mcg by mouth 2 (two) times daily.       Marland Kitchen BIOTIN PO Take 1 tablet by mouth daily at 12 noon.       . gabapentin (NEURONTIN) 300 MG capsule One tablet in the morning and two tablets at night      . Tapentadol HCl (NUCYNTA ER) 100 MG TB12 Take 1 tablet by mouth 2 (two) times daily.  60 tablet  0    Allergies  Allergen Reactions  . Sulfa Antibiotics     Severe rash with significant peeling of face. No airway involvement per patient  . Penicillins Rash    PHYSICAL EXAM: Filed Vitals:   10/06/12 1428  BP: 122/60  Height: 5' 8" (1.727 m)  Weight: 145 lb 1.9 oz (65.826 kg)    General: No acute distress No respiratory difficulty HEENT: normal Neck: supple. JVP 5-6. Carotids 2+ bilat; no bruits. No lymphadenopathy or thryomegaly appreciated. Cor: PMI nondisplaced. Regular rate & rhythm. Prominent P2. Soft TR murmur. No obvious RV lift Lungs: clear on 5 liters [place] Abdomen: soft, nontender, nondistended. No hepatosplenomegaly. No bruits or masses. Good bowel sounds. 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 + gouty nodules. LEs wrapped due to ulcers.  1+ edema to thighs Neuro: alert & oriented x 3, cranial nerves grossly intact. moves all 4 extremities w/o difficulty. Affect pleasant.    ASSESSMENT & PLAN:

## 2012-10-06 NOTE — Assessment & Plan Note (Signed)
Functionally improved on combination therapy but still with very high PA pressures and RV strain on echo. On previous cath CI was low so nearing the point where we might consider IV Flolan however she is a bit reluctant to consider at this point. Will increase Revatio to 80 tid and repeat RHC in 4-6 weeks. Volume status looks pretty good. Reinforced need for daily weights and reviewed use of sliding scale diuretics.Will need to make sure we have a forehead or ear probe to make sure oxygenation is adequate.

## 2012-10-06 NOTE — Patient Instructions (Addendum)
Increase Revatio to 80 mg (4 tabs) Three times a day   Your physician recommends that you schedule a follow-up appointment in: 1 month

## 2012-10-06 NOTE — Progress Notes (Signed)
  Echocardiogram 2D Echocardiogram has been performed.  [name] [name] 10/06/2012, 2:10 PM

## 2012-10-06 NOTE — Addendum Note (Signed)
Encounter addended by: Scarlette Calico, RN on: 10/06/2012  3:07 PM<BR>     Documentation filed: Patient Instructions Section, Orders

## 2012-10-06 NOTE — Telephone Encounter (Signed)
Opened in error

## 2012-10-07 MED ORDER — SILDENAFIL CITRATE 20 MG PO TABS: 80.0000 mg | ORAL_TABLET | Freq: Three times a day (TID) | ORAL | Status: DC

## 2012-10-07 NOTE — Addendum Note (Signed)
Encounter addended by: Scarlette Calico, RN on: 10/07/2012  4:40 PM<BR>     Documentation filed: Orders

## 2012-10-07 NOTE — Addendum Note (Signed)
Encounter addended by: Scarlette Calico, RN on: 10/07/2012  3:29 PM<BR>     Documentation filed: Orders

## 2012-10-08 ENCOUNTER — Other Ambulatory Visit: Payer: BC Managed Care – PPO

## 2012-10-08 DIAGNOSIS — I2789 Other specified pulmonary heart diseases: Secondary | ICD-10-CM

## 2012-10-08 LAB — HEPATIC FUNCTION PANEL
ALT: 18 U/L (ref 0–35)
AST: 31 U/L (ref 0–37)
Albumin: 3.5 g/dL (ref 3.5–5.2)
Alkaline Phosphatase: 124 U/L — ABNORMAL HIGH (ref 39–117)
Bilirubin, Direct: 0.2 mg/dL (ref 0.0–0.3)
Total Bilirubin: 1 mg/dL (ref 0.3–1.2)
Total Protein: 7.2 g/dL (ref 6.0–8.3)

## 2012-10-09 ENCOUNTER — Encounter (HOSPITAL_COMMUNITY): Payer: BC Managed Care – PPO

## 2012-10-10 ENCOUNTER — Encounter: Payer: Self-pay | Admitting: Internal Medicine

## 2012-10-10 ENCOUNTER — Other Ambulatory Visit: Payer: BC Managed Care – PPO

## 2012-10-15 ENCOUNTER — Encounter: Payer: Self-pay | Admitting: Vascular Surgery

## 2012-10-16 ENCOUNTER — Encounter: Payer: Self-pay | Admitting: Vascular Surgery

## 2012-10-16 ENCOUNTER — Ambulatory Visit (INDEPENDENT_AMBULATORY_CARE_PROVIDER_SITE_OTHER): Payer: BC Managed Care – PPO | Admitting: Vascular Surgery

## 2012-10-16 VITALS — BP 128/78 | HR 79 | Resp 18 | Ht 68.0 in | Wt 142.0 lb

## 2012-10-16 DIAGNOSIS — I83893 Varicose veins of bilateral lower extremities with other complications: Secondary | ICD-10-CM

## 2012-10-16 HISTORY — PX: ENDOVENOUS ABLATION SAPHENOUS VEIN W/ LASER: SUR449

## 2012-10-16 NOTE — Progress Notes (Signed)
Laser Ablation Procedure      Date: 10/16/2012    [name] [name] DOB:25-Sep-1951  Consent signed: Yes  Surgeon:T.F. Tong Pieczynski  Procedure: Laser Ablation: left Greater Saphenous Vein  BP 128/78  Pulse 79  Resp 18  Ht _0  (1.727 m)  Wt 142 lb (64.411 kg)  BMI 21.59 kg/m2  Start time: 10:45AM   End time: 11:35AM  Tumescent Anesthesia: 400 cc 0.9% NaCl with 50 cc Lidocaine HCL with 1% Epi and 15 cc 8.4% NaHCO3  Local Anesthesia: 2 cc Lidocaine HCL and NaHCO3 (ratio 2:1)  Continuous Mode: 15 Watts Total Energy 2675 Joules Total Time2:58       Patient tolerated procedure well: Yes    Description of Procedure:  After marking the course of the saphenous vein and the secondary varicosities in the standing position, the patient was placed on the operating table in the supine position, and the left leg was prepped and draped in sterile fashion. Local anesthetic was administered, and under ultrasound guidance the saphenous vein was accessed with a micro needle and guide wire; then the micro puncture sheath was placed. A guide wire was inserted to the saphenofemoral junction, followed by a 5 french sheath.  The position of the sheath and then the laser fiber below the junction was confirmed using the ultrasound and visualization of the aiming beam.  Tumescent anesthesia was administered along the course of the saphenous vein using ultrasound guidance. Protective laser glasses were placed on the patient, and the laser was fired at at 15 watt continuous mode.  For a total of 2675 joules.  A steri strip was applied to the puncture site.         ABD pads and thigh high compression stockings were applied.  Ace wrap bandages were applied  at the top of the saphenofemoral junction.  Blood loss was less than 15 cc.  The patient ambulated out of the operating room having tolerated the procedure well.

## 2012-10-20 ENCOUNTER — Telehealth: Payer: Self-pay | Admitting: *Deleted

## 2012-10-20 NOTE — Telephone Encounter (Signed)
10/20/2012  Time: 2:01 PM   Patient Name: [name] [name]  Patient of: T.F. Early  Procedure:Laser Ablation left  10-16-2012  Reached patient at home and checked  Her status  Yes    Comments/Actions Taken: [name] [name] states she had difficulty getting comfortable and sleeping last night.  She states she is feeling much better today and that she is keeping her leg elevated when sitting and taking Tylenol as needed for left inner thigh discomfort.  Reviewed post procedural instructions with her and reminded her of post laser ablation duplex and follow up appointment with Dr. [name] on 10-23-2012.      _0 @

## 2012-10-22 ENCOUNTER — Encounter: Payer: Self-pay | Admitting: Vascular Surgery

## 2012-10-22 ENCOUNTER — Telehealth: Payer: Self-pay | Admitting: Internal Medicine

## 2012-10-22 DIAGNOSIS — G5793 Unspecified mononeuropathy of bilateral lower limbs: Secondary | ICD-10-CM

## 2012-10-22 MED ORDER — TAPENTADOL HCL ER 100 MG PO TB12: 1.0000 | ORAL_TABLET | Freq: Two times a day (BID) | ORAL | Status: DC

## 2012-10-22 NOTE — Telephone Encounter (Signed)
Printed and will call pt when dr Arnoldo Morale signs and is ready for pick up

## 2012-10-22 NOTE — Telephone Encounter (Signed)
Patient called stating that she need a refill of her nucynta 50 mg faxed to [facility] [address]. Please assist.

## 2012-10-23 ENCOUNTER — Other Ambulatory Visit: Payer: Self-pay | Admitting: Internal Medicine

## 2012-10-23 ENCOUNTER — Ambulatory Visit (INDEPENDENT_AMBULATORY_CARE_PROVIDER_SITE_OTHER): Payer: BC Managed Care – PPO | Admitting: Vascular Surgery

## 2012-10-23 ENCOUNTER — Encounter: Payer: Self-pay | Admitting: Vascular Surgery

## 2012-10-23 VITALS — BP 122/72 | HR 85 | Resp 18 | Ht 68.0 in | Wt 142.0 lb

## 2012-10-23 DIAGNOSIS — L97919 Non-pressure chronic ulcer of unspecified part of right lower leg with unspecified severity: Secondary | ICD-10-CM

## 2012-10-23 DIAGNOSIS — I83219 Varicose veins of right lower extremity with both ulcer of unspecified site and inflammation: Secondary | ICD-10-CM

## 2012-10-23 DIAGNOSIS — L97929 Non-pressure chronic ulcer of unspecified part of left lower leg with unspecified severity: Secondary | ICD-10-CM

## 2012-10-23 DIAGNOSIS — I83229 Varicose veins of left lower extremity with both ulcer of unspecified site and inflammation: Secondary | ICD-10-CM

## 2012-10-23 DIAGNOSIS — I83893 Varicose veins of bilateral lower extremities with other complications: Secondary | ICD-10-CM

## 2012-10-23 NOTE — Progress Notes (Signed)
Left lower extremity venous duplex performed for post ablation @ [facility] 10/23/2012

## 2012-10-23 NOTE — Progress Notes (Signed)
Patient presents today for followup of laser ablation of her left great saphenous vein. She has severe venous hypertension and venous ulcer disease. She had slightly more soreness on this following this procedure in the right leg approximately one month ago. She does have mild bruising on the medial aspect of her left thigh. She does have a palpable occluded saphenous vein at the level of her knee.  She underwent repeat venous duplex in our office today and this reveals closure of her great saphenous vein from the level of just below her knee up to just below the saphenofemoral junction. She does not have any evidence of DVT.  I'm pleased with her results from ablation standpoint. We will see her again in 2 months for continued followup. She'll notify us if she should develop any new difficulties

## 2012-10-24 ENCOUNTER — Other Ambulatory Visit: Payer: Self-pay | Admitting: *Deleted

## 2012-10-24 MED ORDER — TAPENTADOL HCL ER 50 MG PO TB12: 50.0000 mg | ORAL_TABLET | Freq: Every morning | ORAL | Status: DC

## 2012-11-04 ENCOUNTER — Other Ambulatory Visit: Payer: BC Managed Care – PPO

## 2012-11-04 DIAGNOSIS — I2789 Other specified pulmonary heart diseases: Secondary | ICD-10-CM

## 2012-11-04 LAB — HEPATIC FUNCTION PANEL
ALT: 24 U/L (ref 0–35)
AST: 36 U/L (ref 0–37)
Albumin: 3.6 g/dL (ref 3.5–5.2)
Alkaline Phosphatase: 153 U/L — ABNORMAL HIGH (ref 39–117)
Bilirubin, Direct: 0.2 mg/dL (ref 0.0–0.3)
Total Bilirubin: 0.8 mg/dL (ref 0.3–1.2)
Total Protein: 7.3 g/dL (ref 6.0–8.3)

## 2012-11-06 ENCOUNTER — Encounter (HOSPITAL_BASED_OUTPATIENT_CLINIC_OR_DEPARTMENT_OTHER): Payer: BC Managed Care – PPO | Attending: Internal Medicine

## 2012-11-06 ENCOUNTER — Other Ambulatory Visit (HOSPITAL_COMMUNITY): Payer: Self-pay | Admitting: *Deleted

## 2012-11-06 DIAGNOSIS — I83009 Varicose veins of unspecified lower extremity with ulcer of unspecified site: Secondary | ICD-10-CM | POA: Insufficient documentation

## 2012-11-06 DIAGNOSIS — I872 Venous insufficiency (chronic) (peripheral): Secondary | ICD-10-CM | POA: Insufficient documentation

## 2012-11-06 DIAGNOSIS — I10 Essential (primary) hypertension: Secondary | ICD-10-CM

## 2012-11-06 DIAGNOSIS — I739 Peripheral vascular disease, unspecified: Secondary | ICD-10-CM | POA: Insufficient documentation

## 2012-11-06 MED ORDER — FUROSEMIDE 80 MG PO TABS: 80.0000 mg | ORAL_TABLET | Freq: Two times a day (BID) | ORAL | Status: DC

## 2012-11-07 ENCOUNTER — Other Ambulatory Visit: Payer: BC Managed Care – PPO

## 2012-11-10 ENCOUNTER — Ambulatory Visit (HOSPITAL_COMMUNITY)
Admission: RE | Admit: 2012-11-10 | Discharge: 2012-11-10 | Disposition: A | Discharge status: Home / Self Care | Payer: BC Managed Care – PPO | Source: Ambulatory Visit | Attending: Internal Medicine | Admitting: Internal Medicine

## 2012-11-10 VITALS — BP 138/56 | Wt 147.2 lb

## 2012-11-10 DIAGNOSIS — M349 Systemic sclerosis, unspecified: Secondary | ICD-10-CM | POA: Insufficient documentation

## 2012-11-10 DIAGNOSIS — I2789 Other specified pulmonary heart diseases: Secondary | ICD-10-CM

## 2012-11-10 DIAGNOSIS — I10 Essential (primary) hypertension: Secondary | ICD-10-CM | POA: Insufficient documentation

## 2012-11-10 DIAGNOSIS — R69 Illness, unspecified: Secondary | ICD-10-CM

## 2012-11-10 DIAGNOSIS — I70209 Unspecified atherosclerosis of native arteries of extremities, unspecified extremity: Secondary | ICD-10-CM | POA: Insufficient documentation

## 2012-11-10 DIAGNOSIS — C8581 Other specified types of non-Hodgkin lymphoma, lymph nodes of head, face, and neck: Secondary | ICD-10-CM | POA: Insufficient documentation

## 2012-11-10 DIAGNOSIS — I2729 Other secondary pulmonary hypertension: Secondary | ICD-10-CM

## 2012-11-10 NOTE — Progress Notes (Signed)
[name], female   DOB: 10/05/1951, 61 y.o.   MRN: [id number] Primary Care: Dr. John Jenkins Primary Cardiologist: Dr. Enna Warwick Pulmonologist: Dr. Clance  HPI: [name] is a 61 y.o. 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    Initially was in [place] then moved to [place] for 2 years. Has been in [place] since 2011.  With regard to her scleroderma. Was following at [facility] but rheumatologist left. Prednisone was stopped several years ago and has not been anything for her scleroderma since that time.  Diagnosed with PAH in 2007 in [place]. She did have a right heart catheterization by her history for verification. The patient has been treated with cellcept, tracleer and revatio for approximately one to 2 years, and then tells me the medications were stopped in [place] because the echo "looked good".  I do not have her old records available for review. Apparently she was followed very closely with serial echocardiograms that remained fairly normal, and therefore she was taken off medications. She had a recent echo in 2011 as a f/u for her chemotherapy. LV EF 65-70%. RV severely enlarged/HK severe TR. pRVSP 115-120. She saw Dr. Clance and referred here for RHC. No evidence of pulmonary fibrosis on CXR.   Underwent RHC on 04/18/12 as below:  RA = 9  RV = 75/11/14  PA = 71/25 (44)  PCW = 4  Fick cardiac output/index = 3.0/1.7  Thermo CO/CI = 2.7/1.5  PVR = 23.5 Woods  FA sat = 79% on room air (checked 3 times). Sat with finger probe on air 94%  PA sat = 41%, 43%   6 MW 04/18/12 880 feet   Echo 10/13 LV EF 65-70%. RV severely enlarged/HK severe TR. pRVSP 115-120.  She returns for follow up. On Tracleer and Revatio.  Last visit Revatio increased to 80 tid. Remains on O2. Continues to struggle with LE non-healing ulcers. Going to wound care center.  Has been followed [facility]. [facility]. Has had ablations on her veins. Feels like she is getting better. Functional capacity is limited mostly be LE wounds. No syncope/presyncope. LFTs have been fine. At last increased O2 to 5L/min. Hard to tell about edema due to LE wraps. Husband feels that Revatio is improving peripheral cyanosis.   6 MW 11/10/12 650 feet. Limited by legs. Sats down to 85% on 5L (sats at rest 95%)   Review of Systems: All pertinent positives and negatives as in HPI, otherwise negative.   Past Medical History  Diagnosis Date  . History of chicken pox   . Hypertension   . Sclerodermia generalized   . Non Hodgkin's lymphoma     Treated with chemo 2010, felt to be cured.  . Melanoma 1999  . Peripheral arterial occlusive disease   . Neuropathic foot ulcer   . DOE (dyspnea on exertion)     Current Outpatient Prescriptions  Medication Sig Dispense Refill  . amitriptyline (ELAVIL) 10 MG tablet take 1 tablet by mouth at bedtime  30 tablet  11  . amitriptyline (ELAVIL) 25 MG tablet Take 25 mg by mouth at bedtime.      . aspirin 81 MG tablet Take 81 mg by mouth daily.        . atorvastatin (LIPITOR) 40 MG tablet Take 1 tablet by mouth Daily.      . BIOTIN PO Take   1 tablet by mouth daily at 12 noon.       . bosentan (TRACLEER) 125 MG tablet Take 125 mg by mouth 2 (two) times daily.      . Cholecalciferol (VITAMIN D) 2000 UNITS CAPS Take by mouth daily.        . clopidogrel (PLAVIX) 75 MG tablet Take 75 mg by mouth daily.        . collagenase (SANTYL) ointment Apply 1 application topically 2 (two) times daily. To both legs.      . DULoxetine (CYMBALTA) 30 MG capsule Take 1 capsule (30 mg total) by mouth daily.  30 capsule  3  . esomeprazole (NEXIUM) 40 MG capsule Take 1 capsule (40 mg total) by mouth daily before breakfast.  90 capsule  3  . furosemide (LASIX) 80 MG tablet Take 1 tablet (80 mg total) by mouth 2 (two) times daily.  60 tablet  6  . gabapentin  (NEURONTIN) 300 MG capsule One tablet in the morning and two tablets at night      . multivitamin (THERAGRAN) per tablet Take 1 tablet by mouth daily.        . omega-3 acid ethyl esters (LOVAZA) 1 G capsule Take 1 g by mouth See admin instructions. 1 g cap at noon and then 2 capsules at bedtime      . Probiotic Product (PROBIOTIC FORMULA PO) Take 1 capsule by mouth daily.      . sildenafil (REVATIO) 20 MG tablet Take 4 tablets (80 mg total) by mouth 3 (three) times daily.  240 tablet  6  . spironolactone (ALDACTONE) 25 MG tablet Take 1 tablet (25 mg total) by mouth daily.  30 tablet  6  . Tapentadol HCl (NUCYNTA ER) 100 MG TB12 Take 1 tablet by mouth 2 (two) times daily.  60 tablet  0  . Tapentadol HCl 50 MG TB12 Take 50 mg by mouth every morning.  30 tablet  0  . vitamin B-12 (CYANOCOBALAMIN) 500 MCG tablet Take 500 mcg by mouth 2 (two) times daily.         Allergies  Allergen Reactions  . Sulfa Antibiotics     Severe rash with significant peeling of face. No airway involvement per patient  . Penicillins Rash    PHYSICAL EXAM: Filed Vitals:   11/10/12 1359  BP: 138/56  Weight: 147 lb 4 oz (66.792 kg)    General: No acute distress No respiratory difficulty HEENT: normal Neck: supple. JVP 7-8. Carotids 2+ bilat; no bruits. No lymphadenopathy or thryomegaly appreciated. Cor: PMI nondisplaced. Regular rate & rhythm. Prominent P2. Soft TR murmur. + RV lift Lungs: clear on 5 liters [place] Abdomen: soft, nontender, nondistended. No hepatosplenomegaly. No bruits or masses. Good bowel sounds. 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 + gouty nodules. LEs wrapped due to ulcers.  1+ edema to thighs Neuro: alert & oriented x 3, cranial nerves grossly intact. moves all 4 extremities w/o difficulty. Affect pleasant.    ASSESSMENT & PLAN:   

## 2012-11-10 NOTE — Assessment & Plan Note (Addendum)
Overall I feel that [place] is improved on combination therapy. 6MW today not as good as before, however I think this may be more related to her LE wounds. I think it is reasonable to proceed with repeat RHC to reassess where we are at and to see if we should add an inhaled agent or refer for IV epoprostenol. She does get hypoxic with exertion on 5L. She has had trouble using larger tanks in past. I have called Dr. Gwenette Greet to see if there are any other options to increase her O2 delivery without switching to a larger tank.

## 2012-11-10 NOTE — Progress Notes (Signed)
6 min walk test completed, pt walked 650 ft on 5L O2, at start of walk sat was 95% and dropped to 83-84%, after test with rest O2 sat returned to 90%

## 2012-11-12 ENCOUNTER — Encounter (HOSPITAL_COMMUNITY): Payer: Self-pay | Admitting: Pharmacy Technician

## 2012-11-12 ENCOUNTER — Telehealth (HOSPITAL_COMMUNITY): Payer: Self-pay | Admitting: *Deleted

## 2012-11-12 ENCOUNTER — Ambulatory Visit: Payer: BC Managed Care – PPO | Admitting: Internal Medicine

## 2012-11-12 ENCOUNTER — Other Ambulatory Visit (HOSPITAL_COMMUNITY): Payer: Self-pay | Admitting: Adult Health

## 2012-11-12 DIAGNOSIS — I272 Pulmonary hypertension, unspecified: Secondary | ICD-10-CM

## 2012-11-12 NOTE — Telephone Encounter (Signed)
Pt called to let me know she watned to have the [place] on Friday 11/15, [place] lab was full so scheduled pt for 11/15 at 9:30 in Main Lab, pt is aware and instructions reviewed via phone

## 2012-11-14 ENCOUNTER — Ambulatory Visit (HOSPITAL_COMMUNITY)
Admission: RE | Admit: 2012-11-14 | Discharge: 2012-11-14 | Disposition: A | Discharge status: Home / Self Care | Payer: BC Managed Care – PPO | Source: Ambulatory Visit | Attending: Internal Medicine | Admitting: Internal Medicine

## 2012-11-14 ENCOUNTER — Encounter (HOSPITAL_COMMUNITY)
Admission: RE | Disposition: A | Discharge status: Home / Self Care | Payer: Self-pay | Source: Ambulatory Visit | Attending: Internal Medicine

## 2012-11-14 DIAGNOSIS — I10 Essential (primary) hypertension: Secondary | ICD-10-CM | POA: Insufficient documentation

## 2012-11-14 DIAGNOSIS — M349 Systemic sclerosis, unspecified: Secondary | ICD-10-CM | POA: Insufficient documentation

## 2012-11-14 DIAGNOSIS — I279 Pulmonary heart disease, unspecified: Secondary | ICD-10-CM

## 2012-11-14 DIAGNOSIS — I70209 Unspecified atherosclerosis of native arteries of extremities, unspecified extremity: Secondary | ICD-10-CM | POA: Insufficient documentation

## 2012-11-14 DIAGNOSIS — I2789 Other specified pulmonary heart diseases: Secondary | ICD-10-CM | POA: Insufficient documentation

## 2012-11-14 DIAGNOSIS — I272 Pulmonary hypertension, unspecified: Secondary | ICD-10-CM

## 2012-11-14 DIAGNOSIS — Z87898 Personal history of other specified conditions: Secondary | ICD-10-CM | POA: Insufficient documentation

## 2012-11-14 HISTORY — PX: RIGHT HEART CATHETERIZATION: SHX5447

## 2012-11-14 LAB — BASIC METABOLIC PANEL
BUN: 27 mg/dL — ABNORMAL HIGH (ref 6–23)
CO2: 30 mEq/L (ref 19–32)
Calcium: 9.1 mg/dL (ref 8.4–10.5)
Chloride: 99 mEq/L (ref 96–112)
Creatinine, Ser: 0.97 mg/dL (ref 0.50–1.10)
GFR calc Af Amer: 71 mL/min — ABNORMAL LOW (ref >90)
GFR calc non Af Amer: 61 mL/min — ABNORMAL LOW (ref >90)
Glucose, Bld: 100 mg/dL — ABNORMAL HIGH (ref 70–99)
Potassium: 3.6 mEq/L (ref 3.5–5.1)
Sodium: 139 mEq/L (ref 135–145)

## 2012-11-14 LAB — POCT I-STAT 3, ART BLOOD GAS (G3+)
Acid-Base Excess: 6 mmol/L — ABNORMAL HIGH (ref 0.0–2.0)
Bicarbonate: 29.7 mEq/L — ABNORMAL HIGH (ref 20.0–24.0)
O2 Saturation: 86 %
TCO2: 31 mmol/L (ref 0–100)
pCO2 arterial: 40 mmHg (ref 35.0–45.0)
pH, Arterial: 7.478 — ABNORMAL HIGH (ref 7.350–7.450)
pO2, Arterial: 48 mmHg — ABNORMAL LOW (ref 80.0–100.0)

## 2012-11-14 LAB — CBC
HCT: 38.4 % (ref 36.0–46.0)
Hemoglobin: 12.5 g/dL (ref 12.0–15.0)
MCH: 30.7 pg (ref 26.0–34.0)
MCHC: 32.6 g/dL (ref 30.0–36.0)
MCV: 94.3 fL (ref 78.0–100.0)
Platelets: 260 10*3/uL (ref 150–400)
RBC: 4.07 MIL/uL (ref 3.87–5.11)
RDW: 16.8 % — ABNORMAL HIGH (ref 11.5–15.5)
WBC: 5 10*3/uL (ref 4.0–10.5)

## 2012-11-14 LAB — POCT I-STAT 3, VENOUS BLOOD GAS (G3P V)
Acid-Base Excess: 1 mmol/L (ref 0.0–2.0)
Acid-Base Excess: 1 mmol/L (ref 0.0–2.0)
Acid-Base Excess: 4 mmol/L — ABNORMAL HIGH (ref 0.0–2.0)
Acid-Base Excess: 5 mmol/L — ABNORMAL HIGH (ref 0.0–2.0)
Bicarbonate: 26.3 mEq/L — ABNORMAL HIGH (ref 20.0–24.0)
Bicarbonate: 26.9 mEq/L — ABNORMAL HIGH (ref 20.0–24.0)
Bicarbonate: 29.6 mEq/L — ABNORMAL HIGH (ref 20.0–24.0)
Bicarbonate: 30 mEq/L — ABNORMAL HIGH (ref 20.0–24.0)
O2 Saturation: 44 %
O2 Saturation: 55 %
O2 Saturation: 57 %
O2 Saturation: 60 %
TCO2: 28 mmol/L (ref 0–100)
TCO2: 28 mmol/L (ref 0–100)
TCO2: 31 mmol/L (ref 0–100)
TCO2: 31 mmol/L (ref 0–100)
pCO2, Ven: 44.7 mmHg — ABNORMAL LOW (ref 45.0–50.0)
pCO2, Ven: 45.7 mmHg (ref 45.0–50.0)
pCO2, Ven: 45.7 mmHg (ref 45.0–50.0)
pCO2, Ven: 46.8 mmHg (ref 45.0–50.0)
pH, Ven: 7.369 — ABNORMAL HIGH (ref 7.250–7.300)
pH, Ven: 7.377 — ABNORMAL HIGH (ref 7.250–7.300)
pH, Ven: 7.419 — ABNORMAL HIGH (ref 7.250–7.300)
pH, Ven: 7.425 — ABNORMAL HIGH (ref 7.250–7.300)
pO2, Ven: 26 mmHg — CL (ref 30.0–45.0)
pO2, Ven: 30 mmHg (ref 30.0–45.0)
pO2, Ven: 30 mmHg (ref 30.0–45.0)
pO2, Ven: 31 mmHg (ref 30.0–45.0)

## 2012-11-14 LAB — PROTIME-INR
INR: 0.98 (ref 0.00–1.49)
Prothrombin Time: 12.9 seconds (ref 11.6–15.2)

## 2012-11-14 SURGERY — RIGHT HEART CATH
Anesthesia: LOCAL

## 2012-11-14 MED ORDER — SODIUM CHLORIDE 0.9 % IJ SOLN: 3.0000 mL | INTRAMUSCULAR | Status: DC | PRN

## 2012-11-14 MED ORDER — SODIUM CHLORIDE 0.9 % IJ SOLN: 3.0000 mL | Freq: Two times a day (BID) | INTRAMUSCULAR | Status: DC

## 2012-11-14 MED ORDER — FENTANYL CITRATE 0.05 MG/ML IJ SOLN
INTRAMUSCULAR | Status: AC
  Filled 2012-11-14: qty 2

## 2012-11-14 MED ORDER — SODIUM CHLORIDE 0.9 % IV SOLN: INTRAVENOUS | Status: DC

## 2012-11-14 MED ORDER — HEPARIN (PORCINE) IN NACL 2-0.9 UNIT/ML-% IJ SOLN
INTRAMUSCULAR | Status: AC
  Filled 2012-11-14: qty 500

## 2012-11-14 MED ORDER — MIDAZOLAM HCL 2 MG/2ML IJ SOLN
INTRAMUSCULAR | Status: AC
  Filled 2012-11-14: qty 2

## 2012-11-14 MED ORDER — LIDOCAINE HCL (PF) 1 % IJ SOLN
INTRAMUSCULAR | Status: AC
  Filled 2012-11-14: qty 30

## 2012-11-14 MED ORDER — SODIUM CHLORIDE 0.9 % IV SOLN: 250.0000 mL | INTRAVENOUS | Status: DC | PRN

## 2012-11-14 NOTE — Interval H&P Note (Signed)
History and Physical Interval Note:  11/14/2012 9:30 AM  [name] [name]  has presented today for surgery, with the diagnosis of pulmonary hypertension. The various methods of treatment have been discussed with the patient and family. After consideration of risks, benefits and other options for treatment, the patient has consented to  Procedure(s) (LRB) with comments: RIGHT HEART CATH (N/A) as a surgical intervention .  The patient's history has been reviewed, patient examined, no change in status, stable for surgery.  I have reviewed the patient's chart and labs.  Questions were answered to the patient's satisfaction.     [name]

## 2012-11-14 NOTE — CV Procedure (Signed)
Cardiac Cath Procedure Note:  Indication:  Pulmonary hypertension  Procedures performed:  1) Right heart catheterization  Description of procedure:   The risks and indication of the procedure were explained. Consent was signed and placed on the chart. An appropriate timeout was taken prior to the procedure. The right groin was prepped and draped in the routine sterile fashion and anesthetized with 1% local lidocaine.   A 7 FR venous sheath was placed in the right femoral vein using a modified Seldinger technique. A standard Swan-Ganz catheter was used for the procedure.   Complications: None apparent.  Findings:  RA = 14 RV = 84/8/20 PA = 83/29 (50) PCW = 10 Fick cardiac output/index = 4.5/2.5 Thermo CO/CI = 3.8/2.2 PVR = 8.9 Woods (Fick) FA sat = 86% on 3L PA sat = 55%, 57%  Assessment: 1. Persistent severe PAH despite combination oral therapy 2. Improved cardiac output since previous  Plan/Discussion:  Pulmonary pressure remain very high despite combination therapy however RV function seems somewhat improved. Will discuss adding inhaled therapy versus IV epoprostenol.   Titrate oxygen to avoid hypoxemia.  [name] [name] 9:32 AM

## 2012-11-14 NOTE — H&P (View-Only) (Signed)
Patient ID: VICTORIAN GUNN, female   DOB: 12-02-1951, 61 y.o.   MRN: [id number] Primary Care: Dr. Benay Pillow Primary Cardiologist: Dr. [name] Pulmonologist: Dr. Gwenette Greet  HPI: Ms. Podgurski is a 61 y.o. female with history of scleroderma dx'd in 1968, [place] of the brain treated with chem which is in remission since 2010, pulmonary hypertension diagnosed in 2007.    Initially was in [place] then moved to [place] for 2 years. Has been in [place] since 2011.  With regard to her scleroderma. Was following at [facility] but rheumatologist left. Prednisone was stopped several years ago and has not been anything for her scleroderma since that time.  Diagnosed with PAH in 2007 in [place]. She did have a right heart catheterization by her history for verification. The patient has been treated with cellcept, tracleer and revatio for approximately one to 2 years, and then tells me the medications were stopped in [place] because the echo "looked good".  I do not have her old records available for review. Apparently she was followed very closely with serial echocardiograms that remained fairly normal, and therefore she was taken off medications. She had a recent echo in 2011 as a f/u for her chemotherapy. LV EF 65-70%. RV severely enlarged/HK severe TR. pRVSP 115-120. She saw Dr. Gwenette Greet and referred here for [place]. No evidence of pulmonary fibrosis on CXR.   Underwent RHC on 04/18/12 as below:  RA = 9  RV = 75/11/14  PA = 71/25 (44)  PCW = 4  Fick cardiac output/index = 3.0/1.7  Thermo CO/CI = 2.7/1.5  PVR = 23.5 Woods  FA sat = 79% on room air (checked 3 times). Sat with finger probe on air 94%  PA sat = 41%, 43%   6 MW 04/18/12 880 feet   Echo 10/13 LV EF 65-70%. RV severely enlarged/HK severe TR. pRVSP 115-120.  She returns for follow up. On Tracleer and Revatio.  Last visit Revatio increased to 80 tid. Remains on O2. Continues to struggle with LE non-healing ulcers. Going to wound care center.  Has been followed by [facility] [name]. Has had ablations on her veins. Feels like she is getting better. Functional capacity is limited mostly be LE wounds. No syncope/presyncope. LFTs have been fine. At last increased O2 to 5L/min. Hard to tell about edema due to LE wraps. Husband feels that Revatio is improving peripheral cyanosis.   6 MW 11/10/12 650 feet. Limited by legs. Sats down to 85% on 5L (sats at rest 95%)   Review of Systems: All pertinent positives and negatives as in HPI, otherwise negative.   Past Medical History  Diagnosis Date  . History of chicken pox   . Hypertension   . Sclerodermia generalized   . Non Hodgkin's lymphoma     Treated with chemo 2010, felt to be cured.  . Melanoma 1999  . Peripheral arterial occlusive disease   . Neuropathic foot ulcer   . DOE (dyspnea on exertion)     Current Outpatient Prescriptions  Medication Sig Dispense Refill  . amitriptyline (ELAVIL) 10 MG tablet take 1 tablet by mouth at bedtime  30 tablet  11  . amitriptyline (ELAVIL) 25 MG tablet Take 25 mg by mouth at bedtime.      Marland Kitchen aspirin 81 MG tablet Take 81 mg by mouth daily.        Marland Kitchen atorvastatin (LIPITOR) 40 MG tablet Take 1 tablet by mouth Daily.      Marland Kitchen BIOTIN PO Take  1 tablet by mouth daily at 12 noon.       . bosentan (TRACLEER) 125 MG tablet Take 125 mg by mouth 2 (two) times daily.      . Cholecalciferol (VITAMIN D) 2000 UNITS CAPS Take by mouth daily.        . clopidogrel (PLAVIX) 75 MG tablet Take 75 mg by mouth daily.        . collagenase (SANTYL) ointment Apply 1 application topically 2 (two) times daily. To both legs.      . DULoxetine (CYMBALTA) 30 MG capsule Take 1 capsule (30 mg total) by mouth daily.  30 capsule  3  . esomeprazole (NEXIUM) 40 MG capsule Take 1 capsule (40 mg total) by mouth daily before breakfast.  90 capsule  3  . furosemide (LASIX) 80 MG tablet Take 1 tablet (80 mg total) by mouth 2 (two) times daily.  60 tablet  6  . gabapentin  (NEURONTIN) 300 MG capsule One tablet in the morning and two tablets at night      . multivitamin (THERAGRAN) per tablet Take 1 tablet by mouth daily.        Marland Kitchen omega-3 acid ethyl esters (LOVAZA) 1 G capsule Take 1 g by mouth See admin instructions. 1 g cap at noon and then 2 capsules at bedtime      . Probiotic Product (PROBIOTIC FORMULA PO) Take 1 capsule by mouth daily.      . sildenafil (REVATIO) 20 MG tablet Take 4 tablets (80 mg total) by mouth 3 (three) times daily.  240 tablet  6  . spironolactone (ALDACTONE) 25 MG tablet Take 1 tablet (25 mg total) by mouth daily.  30 tablet  6  . Tapentadol HCl (NUCYNTA ER) 100 MG TB12 Take 1 tablet by mouth 2 (two) times daily.  60 tablet  0  . Tapentadol HCl 50 MG TB12 Take 50 mg by mouth every morning.  30 tablet  0  . vitamin B-12 (CYANOCOBALAMIN) 500 MCG tablet Take 500 mcg by mouth 2 (two) times daily.         Allergies  Allergen Reactions  . Sulfa Antibiotics     Severe rash with significant peeling of face. No airway involvement per patient  . Penicillins Rash    PHYSICAL EXAM: Filed Vitals:   11/10/12 1359  BP: 138/56  Weight: 147 lb 4 oz (66.792 kg)    General: No acute distress No respiratory difficulty HEENT: normal Neck: supple. JVP 7-8. Carotids 2+ bilat; no bruits. No lymphadenopathy or thryomegaly appreciated. Cor: PMI nondisplaced. Regular rate & rhythm. Prominent P2. Soft TR murmur. + RV lift Lungs: clear on 5 liters [place] Abdomen: soft, nontender, nondistended. No hepatosplenomegaly. No bruits or masses. Good bowel sounds. 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 + gouty nodules. LEs wrapped due to ulcers.  1+ edema to thighs Neuro: alert & oriented x 3, cranial nerves grossly intact. moves all 4 extremities w/o difficulty. Affect pleasant.    ASSESSMENT & PLAN:

## 2012-11-15 ENCOUNTER — Other Ambulatory Visit: Payer: Self-pay | Admitting: Internal Medicine

## 2012-11-24 ENCOUNTER — Ambulatory Visit (HOSPITAL_COMMUNITY)
Admission: RE | Admit: 2012-11-24 | Discharge: 2012-11-24 | Disposition: A | Discharge status: Home / Self Care | Payer: BC Managed Care – PPO | Source: Ambulatory Visit | Attending: Internal Medicine | Admitting: Internal Medicine

## 2012-11-24 ENCOUNTER — Encounter (HOSPITAL_COMMUNITY): Payer: Self-pay

## 2012-11-24 VITALS — BP 132/78 | Wt 145.1 lb

## 2012-11-24 DIAGNOSIS — I2729 Other secondary pulmonary hypertension: Secondary | ICD-10-CM

## 2012-11-24 DIAGNOSIS — I2789 Other specified pulmonary heart diseases: Secondary | ICD-10-CM | POA: Insufficient documentation

## 2012-11-24 DIAGNOSIS — R69 Illness, unspecified: Secondary | ICD-10-CM

## 2012-11-24 NOTE — Progress Notes (Signed)
[name], female   DOB: 1951/10/31, 61 y.o.   MRN: [id number] Primary Care: Dr. Benay Pillow Primary Cardiologist: Dr. [name] Pulmonologist: Dr. Gwenette Greet  HPI: [name] [name] is a 61 y.o. female with history of scleroderma dx'd in 1968, [place] of the brain treated with chem which is in remission since 2010, pulmonary hypertension diagnosed in 2007.  With regard to her scleroderma. Was following at [facility] but rheumatologist left. Prednisone was stopped several years ago and has not been anything for her scleroderma since that time.  Diagnosed with PAH in 2007 in [place]. She did have a right heart catheterization by her history for verification. The patient has been treated with cellcept, tracleer and revatio for approximately one to 2 years, and then tells me the medications were stopped in [place] because the echo "looked good".  I do not have her old records available for review. Apparently she was followed very closely with serial echocardiograms that remained fairly normal, and therefore she was taken off medications. She had a recent echo in 2011 as a f/u for her chemotherapy. LV EF 65-70%. RV severely enlarged/HK severe TR. pRVSP 115-120. She saw Dr. Gwenette Greet and referred here for [place]. No evidence of pulmonary fibrosis on CXR.   RHC 04/18/12  RA = 9  RV = 75/11/14  PA = 71/25 (44)  PCW = 4  Fick cardiac output/index = 3.0/1.7  Thermo CO/CI = 2.7/1.5  PVR = 13.3 Woods  FA sat = 79% on room air (checked 3 times). Sat with finger probe on air 94%  PA sat = 41%, 43%   6 MW 04/18/12 880 feet 6 MW 11/10/12 650 feet. Limited by legs. Sats down to 85% on 5L (sats at rest 95%)  Echo 10/13 LV EF 65-70%. RV severely enlarged/HK severe TR. pRVSP 115-120.   Repeat RHC 11/14/12  RA = 14  RV = 84/8/20  PA = 83/29 (50)  PCW = 10  Fick cardiac output/index = 4.5/2.5  Thermo CO/CI = 3.8/2.2  PVR = 8.9 Woods (Fick)  FA sat = 86% on 3L  PA sat = 55%, 57  Last week had RHC as above  which showed persistent severe PAH despite combination oral therapy. Discussed referral to [facility] for consideration of IV epoprostenol.     She returns for follow up. On Tracleer and Revatio.     Review of Systems: All pertinent positives and negatives as in HPI, otherwise negative.   Past Medical History  Diagnosis Date  . History of chicken pox   . Hypertension   . Sclerodermia generalized   . Non Hodgkin's lymphoma     Treated with chemo 2010, felt to be cured.  . Melanoma 1999  . Peripheral arterial occlusive disease   . Neuropathic foot ulcer   . DOE (dyspnea on exertion)     Current Outpatient Prescriptions  Medication Sig Dispense Refill  . amitriptyline (ELAVIL) 10 MG tablet take 1 tablet by mouth at bedtime  30 tablet  11  . aspirin 81 MG tablet Take 81 mg by mouth daily.        Marland Kitchen atorvastatin (LIPITOR) 40 MG tablet Take 1 tablet by mouth Daily.      Marland Kitchen BIOTIN PO Take 1 tablet by mouth daily at 12 noon.       . bosentan (TRACLEER) 125 MG tablet Take 125 mg by mouth 2 (two) times daily.      . Cholecalciferol (VITAMIN D) 2000 UNITS CAPS Take by mouth  daily.        . citalopram (CELEXA) 20 MG tablet Take 20 mg by mouth daily.      . citalopram (CELEXA) 20 MG tablet take 1 tablet by mouth daily  30 tablet  11  . clopidogrel (PLAVIX) 75 MG tablet Take 75 mg by mouth daily.        . collagenase (SANTYL) ointment Apply 1 application topically 2 (two) times daily. To both legs.      Marland Kitchen esomeprazole (NEXIUM) 40 MG capsule Take 1 capsule (40 mg total) by mouth daily before breakfast.  90 capsule  3  . furosemide (LASIX) 80 MG tablet Take 1 tablet (80 mg total) by mouth 2 (two) times daily.  60 tablet  6  . gabapentin (NEURONTIN) 300 MG capsule One tablet in the morning and two tablets at night      . multivitamin (THERAGRAN) per tablet Take 1 tablet by mouth daily.        Marland Kitchen omega-3 acid ethyl esters (LOVAZA) 1 G capsule Take 1 g by mouth See admin instructions. 1 g cap at noon and  then 2 capsules at bedtime      . Probiotic Product (PROBIOTIC FORMULA PO) Take 1 capsule by mouth daily.      . sildenafil (REVATIO) 20 MG tablet Take 4 tablets (80 mg total) by mouth 3 (three) times daily.  240 tablet  6  . spironolactone (ALDACTONE) 25 MG tablet Take 1 tablet (25 mg total) by mouth daily.  30 tablet  6  . Tapentadol HCl (NUCYNTA ER) 100 MG TB12 Take 1 tablet by mouth 2 (two) times daily.  60 tablet  0  . Tapentadol HCl 50 MG TB12 Take 50 mg by mouth every morning.  30 tablet  0  . vitamin B-12 (CYANOCOBALAMIN) 500 MCG tablet Take 500 mcg by mouth 2 (two) times daily.         Allergies  Allergen Reactions  . Sulfa Antibiotics     Severe rash with significant peeling of face. No airway involvement per patient  . Penicillins Rash    PHYSICAL EXAM: Filed Vitals:   11/24/12 0933  BP: 132/78  Weight: 145 lb 1.9 oz (65.826 kg)    General: No acute distress No respiratory difficulty HEENT: normal Neck: supple. JVP 7-8. Carotids 2+ bilat; no bruits. No lymphadenopathy or thryomegaly appreciated. Cor: PMI nondisplaced. Regular rate & rhythm. Prominent P2. Soft TR murmur. + RV lift Lungs: clear on 5 liters [place] Abdomen: soft, nontender, nondistended. No hepatosplenomegaly. No bruits or masses. Good bowel sounds. 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 + gouty nodules. LEs wrapped due to ulcers.  1-2+ edema to thighs Neuro: alert & oriented x 3, cranial nerves grossly intact. moves all 4 extremities w/o difficulty. Affect pleasant.    ASSESSMENT & PLAN:

## 2012-11-24 NOTE — Assessment & Plan Note (Signed)
Continues with severe PAH despite combination therapy. I have suggested IV epoprostenol and we discussed the way it is administered. Have called Dr. Gilles Chiquito at [facility] today  and she will arrange an evaluation in the next few weeks.

## 2012-11-25 ENCOUNTER — Telehealth (HOSPITAL_COMMUNITY): Payer: Self-pay | Admitting: Cardiology

## 2012-11-25 NOTE — Telephone Encounter (Signed)
Pt aware

## 2012-11-25 NOTE — Telephone Encounter (Signed)
Spoke w/[facility], the [place] tanks are the liquid oxygen, she states it's a lot more expensive so the insurance company's have a strict list of requirements for pt's to get those tanks and pt does not qualify, she states with pt's liter flow she is on the smallest tanks available.  Attempted to call pt and Left message to call back

## 2012-11-25 NOTE — Telephone Encounter (Signed)
While at wound care visit seen someone with a helios O2 tank, very small compact and easy to get around with. Aware this is something that [facility] can offer. Can we please get equipment changed?

## 2012-12-01 ENCOUNTER — Other Ambulatory Visit: Payer: Self-pay | Admitting: Internal Medicine

## 2012-12-01 ENCOUNTER — Encounter (HOSPITAL_BASED_OUTPATIENT_CLINIC_OR_DEPARTMENT_OTHER): Payer: BC Managed Care – PPO | Attending: Internal Medicine

## 2012-12-01 DIAGNOSIS — I8390 Asymptomatic varicose veins of unspecified lower extremity: Secondary | ICD-10-CM | POA: Insufficient documentation

## 2012-12-01 DIAGNOSIS — I739 Peripheral vascular disease, unspecified: Secondary | ICD-10-CM | POA: Insufficient documentation

## 2012-12-01 DIAGNOSIS — I872 Venous insufficiency (chronic) (peripheral): Secondary | ICD-10-CM | POA: Insufficient documentation

## 2012-12-01 DIAGNOSIS — L97809 Non-pressure chronic ulcer of other part of unspecified lower leg with unspecified severity: Secondary | ICD-10-CM | POA: Insufficient documentation

## 2012-12-02 ENCOUNTER — Encounter (HOSPITAL_BASED_OUTPATIENT_CLINIC_OR_DEPARTMENT_OTHER): Payer: BC Managed Care – PPO

## 2012-12-04 ENCOUNTER — Encounter (HOSPITAL_BASED_OUTPATIENT_CLINIC_OR_DEPARTMENT_OTHER): Payer: BC Managed Care – PPO

## 2012-12-08 ENCOUNTER — Ambulatory Visit: Payer: BC Managed Care – PPO | Admitting: Internal Medicine

## 2012-12-11 ENCOUNTER — Other Ambulatory Visit (INDEPENDENT_AMBULATORY_CARE_PROVIDER_SITE_OTHER): Payer: BC Managed Care – PPO

## 2012-12-11 DIAGNOSIS — I2789 Other specified pulmonary heart diseases: Secondary | ICD-10-CM

## 2012-12-11 DIAGNOSIS — I2729 Other secondary pulmonary hypertension: Secondary | ICD-10-CM

## 2012-12-11 DIAGNOSIS — R69 Illness, unspecified: Secondary | ICD-10-CM

## 2012-12-11 LAB — HEPATIC FUNCTION PANEL
ALT: 18 U/L (ref 0–35)
AST: 30 U/L (ref 0–37)
Albumin: 3.8 g/dL (ref 3.5–5.2)
Alkaline Phosphatase: 197 U/L — ABNORMAL HIGH (ref 39–117)
Bilirubin, Direct: 0.1 mg/dL (ref 0.0–0.3)
Total Bilirubin: 0.9 mg/dL (ref 0.3–1.2)
Total Protein: 7.4 g/dL (ref 6.0–8.3)

## 2012-12-11 LAB — BASIC METABOLIC PANEL
BUN: 29 mg/dL — ABNORMAL HIGH (ref 6–23)
CO2: 27 mEq/L (ref 19–32)
Calcium: 9.1 mg/dL (ref 8.4–10.5)
Chloride: 98 mEq/L (ref 96–112)
Creatinine, Ser: 0.9 mg/dL (ref 0.4–1.2)
GFR: 63.88 mL/min (ref >60.00)
Glucose, Bld: 111 mg/dL — ABNORMAL HIGH (ref 70–99)
Potassium: 4 mEq/L (ref 3.5–5.1)
Sodium: 135 mEq/L (ref 135–145)

## 2012-12-12 ENCOUNTER — Other Ambulatory Visit: Payer: BC Managed Care – PPO

## 2012-12-12 ENCOUNTER — Telehealth (HOSPITAL_COMMUNITY): Payer: Self-pay | Admitting: Cardiology

## 2012-12-12 DIAGNOSIS — I2789 Other specified pulmonary heart diseases: Secondary | ICD-10-CM

## 2012-12-12 DIAGNOSIS — I270 Primary pulmonary hypertension: Secondary | ICD-10-CM

## 2012-12-12 NOTE — Telephone Encounter (Signed)
Per VO Dr.Bensimhon additonal studies will be needed to complete referral to [facility] at [facility]. VQ scan,PFT, HIIV, and ANA are needed  Also clinic requested ,most recent ECHO and RHC be faxed over. [phone]

## 2012-12-15 ENCOUNTER — Telehealth (HOSPITAL_COMMUNITY): Payer: Self-pay | Admitting: *Deleted

## 2012-12-15 DIAGNOSIS — I2789 Other specified pulmonary heart diseases: Secondary | ICD-10-CM

## 2012-12-15 NOTE — Telephone Encounter (Signed)
Pt called she states she has been talking to [facility] regarding appt and they would like for her to have pfts, 6 mw test, echo (within 3 months), chest xray, VQ scan and lab work (cmp, bnp, cbc, ANA, HIV) pt had echo in Oct, will send that disc, she is sch for xray, VQ and labs 12/13, will sch pft and 6 mw test and call her back

## 2012-12-16 ENCOUNTER — Encounter (HOSPITAL_COMMUNITY)
Admission: RE | Admit: 2012-12-16 | Discharge: 2012-12-16 | Disposition: A | Discharge status: Home / Self Care | Payer: BC Managed Care – PPO | Source: Ambulatory Visit | Attending: Internal Medicine | Admitting: Internal Medicine

## 2012-12-16 ENCOUNTER — Other Ambulatory Visit: Payer: BC Managed Care – PPO

## 2012-12-16 ENCOUNTER — Ambulatory Visit (HOSPITAL_COMMUNITY)
Admission: RE | Admit: 2012-12-16 | Discharge: 2012-12-16 | Disposition: A | Discharge status: Home / Self Care | Payer: BC Managed Care – PPO | Source: Ambulatory Visit | Attending: Internal Medicine | Admitting: Internal Medicine

## 2012-12-16 DIAGNOSIS — J4489 Other specified chronic obstructive pulmonary disease: Secondary | ICD-10-CM | POA: Insufficient documentation

## 2012-12-16 DIAGNOSIS — I270 Primary pulmonary hypertension: Secondary | ICD-10-CM

## 2012-12-16 DIAGNOSIS — J449 Chronic obstructive pulmonary disease, unspecified: Secondary | ICD-10-CM | POA: Insufficient documentation

## 2012-12-16 DIAGNOSIS — R0602 Shortness of breath: Secondary | ICD-10-CM | POA: Insufficient documentation

## 2012-12-16 DIAGNOSIS — I2789 Other specified pulmonary heart diseases: Secondary | ICD-10-CM | POA: Insufficient documentation

## 2012-12-16 MED ORDER — TECHNETIUM TO 99M ALBUMIN AGGREGATED
6.0000 | Freq: Once | INTRAVENOUS | Status: AC | PRN
  Administered 2012-12-16: 6 via INTRAVENOUS

## 2012-12-16 MED ORDER — TECHNETIUM TC 99M DIETHYLENETRIAME-PENTAACETIC ACID: 40.0000 | Freq: Once | INTRAVENOUS | Status: AC | PRN

## 2012-12-16 NOTE — Telephone Encounter (Signed)
Pt sch for pfts 12/18 at 1pm and will have 6 min walk test afterwards she is aware

## 2012-12-17 ENCOUNTER — Ambulatory Visit (HOSPITAL_COMMUNITY)
Admission: RE | Admit: 2012-12-17 | Discharge: 2012-12-17 | Disposition: A | Discharge status: Home / Self Care | Payer: BC Managed Care – PPO | Source: Ambulatory Visit | Attending: Internal Medicine | Admitting: Internal Medicine

## 2012-12-17 ENCOUNTER — Ambulatory Visit (HOSPITAL_BASED_OUTPATIENT_CLINIC_OR_DEPARTMENT_OTHER)
Admission: RE | Admit: 2012-12-17 | Discharge: 2012-12-17 | Disposition: A | Discharge status: Home / Self Care | Payer: BC Managed Care – PPO | Source: Ambulatory Visit | Attending: Internal Medicine | Admitting: Internal Medicine

## 2012-12-17 VITALS — BP 122/60 | HR 100 | Wt 143.5 lb

## 2012-12-17 DIAGNOSIS — I2789 Other specified pulmonary heart diseases: Secondary | ICD-10-CM

## 2012-12-17 DIAGNOSIS — I279 Pulmonary heart disease, unspecified: Secondary | ICD-10-CM | POA: Insufficient documentation

## 2012-12-17 LAB — CBC
HCT: 41 % (ref 36.0–46.0)
Hemoglobin: 13.2 g/dL (ref 12.0–15.0)
MCH: 31.1 pg (ref 26.0–34.0)
MCHC: 32.2 g/dL (ref 30.0–36.0)
MCV: 96.5 fL (ref 78.0–100.0)
Platelets: 324 10*3/uL (ref 150–400)
RBC: 4.25 MIL/uL (ref 3.87–5.11)
RDW: 16.9 % — ABNORMAL HIGH (ref 11.5–15.5)
WBC: 9.7 10*3/uL (ref 4.0–10.5)

## 2012-12-17 LAB — PRO B NATRIURETIC PEPTIDE: Pro B Natriuretic peptide (BNP): 3211 pg/mL — ABNORMAL HIGH (ref 0–125)

## 2012-12-17 MED ORDER — ALBUTEROL SULFATE (5 MG/ML) 0.5% IN NEBU
2.5000 mg | INHALATION_SOLUTION | Freq: Once | RESPIRATORY_TRACT | Status: AC
  Administered 2012-12-17: 2.5 mg via RESPIRATORY_TRACT

## 2012-12-17 NOTE — Progress Notes (Signed)
Pt in for 6 min walk, she ambulated 900 ft, O2 sat ranged from 85-91% on 5 L of O2

## 2012-12-18 LAB — ANA: Anti Nuclear Antibody(ANA): POSITIVE — AB

## 2012-12-18 LAB — ANTI-NUCLEAR AB-TITER (ANA TITER): ANA Titer 1: 1:2560 {titer} — ABNORMAL HIGH

## 2012-12-22 ENCOUNTER — Telehealth: Payer: Self-pay | Admitting: Internal Medicine

## 2012-12-22 NOTE — Telephone Encounter (Signed)
[facility] Triage Call Report Triage Record Num: [id number] Operator: Flonnie Hailstone Patient Name: [name] [name] Call Date & Time: 12/19/2012 5:12:01PM Patient Phone: [phone] PCP: Benay Pillow Patient Gender: Female PCP Fax : [phone] Patient DOB: 02-17-1951 Practice Name: [facility]  Reason for Call: Caller: [place]/Patient; PCP: Benay Pillow (Adults only); CB#: [phone]; Has nasal congestion since 12-18. Has developed heacahce 12-20 that hurts when coughs or blows nose. MInimal cough. Is wanting antibiotic. Advised appointment 12-21 but is going out of town. Will call office 12-23 if not improving.  Protocol(s) Used: Upper Respiratory Infection (URI)  Recommended Outcome per Protocol: See Provider within 24 hours  Reason for Outcome: Mild to moderate headache for more than 24 hours unrelieved with nonprescription medications

## 2012-12-29 ENCOUNTER — Encounter: Payer: Self-pay | Admitting: Vascular Surgery

## 2012-12-30 ENCOUNTER — Ambulatory Visit (INDEPENDENT_AMBULATORY_CARE_PROVIDER_SITE_OTHER): Payer: BC Managed Care – PPO | Admitting: Vascular Surgery

## 2012-12-30 ENCOUNTER — Encounter: Payer: Self-pay | Admitting: Vascular Surgery

## 2012-12-30 VITALS — BP 118/60 | HR 79 | Resp 18 | Ht 68.0 in | Wt 148.8 lb

## 2012-12-30 DIAGNOSIS — I83219 Varicose veins of right lower extremity with both ulcer of unspecified site and inflammation: Secondary | ICD-10-CM

## 2012-12-30 DIAGNOSIS — I83229 Varicose veins of left lower extremity with both ulcer of unspecified site and inflammation: Secondary | ICD-10-CM

## 2012-12-30 DIAGNOSIS — L97929 Non-pressure chronic ulcer of unspecified part of left lower leg with unspecified severity: Secondary | ICD-10-CM

## 2012-12-30 NOTE — Progress Notes (Signed)
Here today for followup of her venous hypertension. She has continued to be seen in the wound center and is currently being treated with bilateral lower extremity Apligraf. These have been applied and she has a dressing and they were not removed today. She reports that the position of the wound center for have completely have near complete healing on the left and progress on the right. She does not have any pain and has not had any more bleeding which is what she initially presented with sudden severe bleeding from her saphenous vein erosion at her ankle. Her swelling is controlled with the [place] wraps.  I imaged her saphenous vein at the distal thigh bilaterally and she is complete closure of these by sono site ultrasound bilaterally.  Discussed with the patient and her husband the ongoing need for lifelong compression and elevation. She will continue followup at the wound center for the open venous ulcers. She also reports that she is planning a trip to [facility] for evaluation of a new treatment for severe pulmonary hypertension. She will notify should she develop a progressive problems in her left legs

## 2013-01-01 ENCOUNTER — Encounter (HOSPITAL_BASED_OUTPATIENT_CLINIC_OR_DEPARTMENT_OTHER): Payer: BC Managed Care – PPO

## 2013-01-01 ENCOUNTER — Encounter (HOSPITAL_BASED_OUTPATIENT_CLINIC_OR_DEPARTMENT_OTHER): Payer: BC Managed Care – PPO | Attending: Internal Medicine

## 2013-01-01 DIAGNOSIS — I83009 Varicose veins of unspecified lower extremity with ulcer of unspecified site: Secondary | ICD-10-CM | POA: Insufficient documentation

## 2013-01-01 DIAGNOSIS — I739 Peripheral vascular disease, unspecified: Secondary | ICD-10-CM | POA: Insufficient documentation

## 2013-01-08 ENCOUNTER — Encounter (HOSPITAL_COMMUNITY): Payer: BC Managed Care – PPO

## 2013-01-08 ENCOUNTER — Other Ambulatory Visit: Payer: BC Managed Care – PPO

## 2013-01-08 DIAGNOSIS — I2789 Other specified pulmonary heart diseases: Secondary | ICD-10-CM

## 2013-01-08 LAB — HEPATIC FUNCTION PANEL
ALT: 18 U/L (ref 0–35)
AST: 29 U/L (ref 0–37)
Albumin: 3.5 g/dL (ref 3.5–5.2)
Alkaline Phosphatase: 190 U/L — ABNORMAL HIGH (ref 39–117)
Bilirubin, Direct: 0.2 mg/dL (ref 0.0–0.3)
Total Bilirubin: 1 mg/dL (ref 0.3–1.2)
Total Protein: 7.4 g/dL (ref 6.0–8.3)

## 2013-01-09 ENCOUNTER — Other Ambulatory Visit: Payer: BC Managed Care – PPO

## 2013-01-10 ENCOUNTER — Emergency Department (HOSPITAL_COMMUNITY)
Admission: EM | Admit: 2013-01-10 | Discharge: 2013-01-10 | Disposition: A | Discharge status: Home / Self Care | Payer: BC Managed Care – PPO | Attending: Emergency Medicine | Admitting: Emergency Medicine

## 2013-01-10 ENCOUNTER — Ambulatory Visit (INDEPENDENT_AMBULATORY_CARE_PROVIDER_SITE_OTHER): Payer: BC Managed Care – PPO | Admitting: Internal Medicine

## 2013-01-10 ENCOUNTER — Encounter (HOSPITAL_COMMUNITY): Payer: Self-pay | Admitting: Emergency Medicine

## 2013-01-10 ENCOUNTER — Encounter: Payer: Self-pay | Admitting: Internal Medicine

## 2013-01-10 VITALS — BP 110/58 | Temp 97.7°F | Wt 142.0 lb

## 2013-01-10 DIAGNOSIS — Z7982 Long term (current) use of aspirin: Secondary | ICD-10-CM | POA: Insufficient documentation

## 2013-01-10 DIAGNOSIS — Z8571 Personal history of Hodgkin lymphoma: Secondary | ICD-10-CM | POA: Insufficient documentation

## 2013-01-10 DIAGNOSIS — Z8619 Personal history of other infectious and parasitic diseases: Secondary | ICD-10-CM | POA: Insufficient documentation

## 2013-01-10 DIAGNOSIS — Z79899 Other long term (current) drug therapy: Secondary | ICD-10-CM | POA: Insufficient documentation

## 2013-01-10 DIAGNOSIS — Z872 Personal history of diseases of the skin and subcutaneous tissue: Secondary | ICD-10-CM | POA: Insufficient documentation

## 2013-01-10 DIAGNOSIS — Z85820 Personal history of malignant melanoma of skin: Secondary | ICD-10-CM | POA: Insufficient documentation

## 2013-01-10 DIAGNOSIS — R040 Epistaxis: Secondary | ICD-10-CM

## 2013-01-10 DIAGNOSIS — Z8709 Personal history of other diseases of the respiratory system: Secondary | ICD-10-CM | POA: Insufficient documentation

## 2013-01-10 DIAGNOSIS — I2789 Other specified pulmonary heart diseases: Secondary | ICD-10-CM | POA: Insufficient documentation

## 2013-01-10 DIAGNOSIS — Z8679 Personal history of other diseases of the circulatory system: Secondary | ICD-10-CM | POA: Insufficient documentation

## 2013-01-10 LAB — CBC
HCT: 37.1 % (ref 36.0–46.0)
Hemoglobin: 12 g/dL (ref 12.0–15.0)
MCH: 30.9 pg (ref 26.0–34.0)
MCHC: 32.3 g/dL (ref 30.0–36.0)
MCV: 95.6 fL (ref 78.0–100.0)
Platelets: 267 10*3/uL (ref 150–400)
RBC: 3.88 MIL/uL (ref 3.87–5.11)
RDW: 16.1 % — ABNORMAL HIGH (ref 11.5–15.5)
WBC: 7.1 10*3/uL (ref 4.0–10.5)

## 2013-01-10 LAB — PROTIME-INR
INR: 0.98 (ref 0.00–1.49)
Prothrombin Time: 12.9 seconds (ref 11.6–15.2)

## 2013-01-10 LAB — APTT: aPTT: 68 seconds — ABNORMAL HIGH (ref 24–37)

## 2013-01-10 NOTE — ED Notes (Signed)
Pt states that she is on 8 L of 02 at home for pulmonary htn.  States that he nose was dry and she blew it last night and it started bleeding.  States that it has been bleeding fairly consistently since it started last night.  NAD.  VSS.

## 2013-01-10 NOTE — ED Provider Notes (Signed)
History     CSN: [id number]  Arrival date & time 01/10/13  1221   First MD Initiated Contact with Patient 01/10/13 1341      Chief Complaint  Patient presents with  . Epistaxis    (Consider location/radiation/quality/duration/timing/severity/associated sxs/prior treatment) HPI Pt presenting with epistaxis from bilateral nostrils. More from right side.  Bleeding started last night- she has tried holding pressure without much relief.  She wears [place] o2 due to pulmonary hypertension and has been using the [place] through her mouth.  No hx of bruising or recent illness.  She states that this has happened in the past due to nasal passages drying out from the O2.  There are no other associated systemic symptoms, there are no other alleviating or modifying factors.   Past Medical History  Diagnosis Date  . History of chicken pox   . Hypertension   . Sclerodermia generalized   . Non Hodgkin's lymphoma     Treated with chemo 2010, felt to be cured.  . Melanoma 1999  . Peripheral arterial occlusive disease   . Neuropathic foot ulcer   . DOE (dyspnea on exertion)     Past Surgical History  Procedure Date  . Rt little toe removal 10/1998  . Stent left thigh 3/11  . Tonsillectomy and adenoidectomy   . Small intestine surgery   . Biopsy on cerebellum   . Vein ligation and stripping 05/16/2012    Procedure: VEIN LIGATION AND STRIPPING;  Surgeon: [name], MD;  Location: [facility] OR;  Service: Vascular;  Laterality: Right;  Irrigation and Debridement right lower leg, ligation of saphenous vein.  . Endovenous ablation saphenous vein w/ laser 07-24-2012    right greater saphenous vein by [name], M.D.   . Endovenous ablation saphenous vein w/ laser 10-16-2012    left greater saphenous vein  by Dr. [name]  . Apligraph 12-04-2102,   12-18-2012    bilateral calves   [name]    Family History  Problem Relation Age of Onset  . Lupus Father   . Lymphoma Father   .  Hyperlipidemia Mother   . Cancer Daughter     sarcoma  . Colon cancer      History  Substance Use Topics  . Smoking status: Never Smoker   . Smokeless tobacco: Never Used  . Alcohol Use: Yes     Comment: couple of times a week    OB History    Grav Para Term Preterm Abortions TAB SAB Ect Mult Living                  Review of Systems ROS reviewed and all otherwise negative except for mentioned in HPI  Allergies  Sulfa antibiotics and Penicillins  Home Medications   Current Outpatient Rx  Name  Route  Sig  Dispense  Refill  . RISAQUAD PO CAPS   Oral   Take 1 capsule by mouth daily.         Marland Kitchen AMITRIPTYLINE HCL 10 MG PO TABS      take 1 tablet by mouth at bedtime   30 tablet   11   . ASPIRIN 81 MG PO TABS   Oral   Take 81 mg by mouth every evening.          . ATORVASTATIN CALCIUM 40 MG PO TABS   Oral   Take 1 tablet by mouth every evening.          Marland Kitchen BIOTIN  10 MG PO TABS   Oral   Take 1 tablet by mouth every morning.         Marland Kitchen BOSENTAN 125 MG PO TABS   Oral   Take 125 mg by mouth 2 (two) times daily.         Marland Kitchen VITAMIN D 2000 UNITS PO CAPS   Oral   Take 1 capsule by mouth daily.          Marland Kitchen CITALOPRAM HYDROBROMIDE 20 MG PO TABS   Oral   Take 20 mg by mouth daily.         Marland Kitchen CLOPIDOGREL BISULFATE 75 MG PO TABS      take 1 tablet by mouth once daily   30 tablet   12   . ESOMEPRAZOLE MAGNESIUM 40 MG PO CPDR   Oral   Take 1 capsule (40 mg total) by mouth daily before breakfast.   90 capsule   3   . OMEGA-3 FATTY ACIDS 1000 MG PO CAPS   Oral   Take 1-2 g by mouth 2 (two) times daily. Takes 1 capsule in the morning and 2 capsules at night         . FUROSEMIDE 80 MG PO TABS   Oral   Take 1 tablet (80 mg total) by mouth 2 (two) times daily.   60 tablet   6   . GABAPENTIN 300 MG PO CAPS   Oral   Take 300-600 mg by mouth 2 (two) times daily. One tablet in the morning and two tablets at night         . MULTIVITAMINS PO TABS    Oral   Take 1 tablet by mouth daily.           Marland Kitchen SILDENAFIL CITRATE 20 MG PO TABS   Oral   Take 4 tablets (80 mg total) by mouth 3 (three) times daily.   240 tablet   6   . SPIRONOLACTONE 25 MG PO TABS   Oral   Take 25 mg by mouth every morning.         Marland Kitchen VITAMIN B-12 500 MCG PO TABS   Oral   Take 500 mcg by mouth 2 (two) times daily.            BP 113/64  Pulse 85  Temp 98 F (36.7 C) (Oral)  Resp 18  SpO2 93% Vitals reviewed Physical Exam Physical Examination: General appearance - alert, well appearing, and in no distress Mental status - alert, oriented to person, place, and time Eyes- no scleral icterus, no conjunctival injection Nose - bilateral blood clots in nostrils- more prominent in right nostril- no arterial bleeding Mouth - mucous membranes moist, pharynx normal without lesions Chest - clear to auscultation, no wheezes, rales or rhonchi, symmetric air entry Heart - normal rate, regular rhythm, normal S1, S2, no murmurs, rubs, clicks or gallops Extremities - peripheral pulses normal, no pedal edema, no clubbing or cyanosis Skin - normal coloration and turgor, no rashes  ED Course  Procedures (including critical care time)  Labs Reviewed  CBC - Abnormal; Notable for the following:    RDW 16.1 (*)     All other components within normal limits  APTT - Abnormal; Notable for the following:    aPTT 68 (*)     All other components within normal limits  PROTIME-INR  LAB REPORT - SCANNED   No results found.   1. Epistaxis       MDM  Pt presenting with c/o epistaxis.  Nasal packing performed by Verl Dicker, PA.  Pt has reassuring labs- not anemic.  Bleeding has not recurred after packing placed while waiting for labs.  Pt to f/u with ENT.  Discharged with strict return precautions.  Pt agreeable with plan.        Threasa Beards, MD 01/11/13 [phone]

## 2013-01-10 NOTE — ED Provider Notes (Addendum)
EPISTAXIS MANAGEMENT Date/Time: 01/10/2013 2:05 PM Performed by: Verl Dicker Authorized by: Verl Dicker Consent: Verbal consent obtained. Consent given by: patient Patient understanding: patient states understanding of the procedure being performed Patient consent: the patient's understanding of the procedure matches consent given Patient identity confirmed: verbally with patient and arm band Patient sedated: no Treatment site: left anterior and right anterior Repair method: anterior pack Post-procedure assessment: bleeding stopped Treatment complexity: simple Patient tolerance: Patient tolerated the procedure well with no immediate complications. Packing material: [place], PA-C 01/10/13 [address], [place] 01/27/13 [phone]

## 2013-01-11 NOTE — Progress Notes (Signed)
  Subjective:    [name], female    DOB: [birth year], 62 y.o.   MRN: [id number]  HPI [name] [name] presents due to epistaxis. The bleeding has been difficult to control ENT on call recommended afrin soaked gauze packed into the nostril. This has not stopped the bleeding.  She is referred to ED for eval and treatment.   Review of Systems     Objective:   Physical Exam        Assessment & Plan:

## 2013-01-12 NOTE — ED Provider Notes (Signed)
Medical screening examination/treatment/procedure(s) were conducted as a shared visit with non-physician practitioner(s) and myself.  I personally evaluated the patient during the encounter  Threasa Beards, MD 01/12/13 1210

## 2013-01-14 ENCOUNTER — Encounter: Payer: Self-pay | Admitting: Internal Medicine

## 2013-01-14 ENCOUNTER — Ambulatory Visit (INDEPENDENT_AMBULATORY_CARE_PROVIDER_SITE_OTHER): Payer: BC Managed Care – PPO | Admitting: Internal Medicine

## 2013-01-14 VITALS — BP 132/62 | Temp 98.1°F | Wt 143.0 lb

## 2013-01-14 DIAGNOSIS — J329 Chronic sinusitis, unspecified: Secondary | ICD-10-CM | POA: Insufficient documentation

## 2013-01-14 MED ORDER — CEFUROXIME AXETIL 500 MG PO TABS: 500.0000 mg | ORAL_TABLET | Freq: Two times a day (BID) | ORAL | Status: AC

## 2013-01-14 NOTE — Progress Notes (Signed)
Subjective:    [name], female    DOB: 11-29-1951, 62 y.o.   MRN: [id number]  HPI  62 year old white female with history of primary pulmonary hypertension and scleroderma complains of sinus congestion for 1 week. [facility] for [facility] epistaxis. She was referred to the ER and required nasal packing. She later followed up with ENT-Dr. Erik Obey. He removed the packing and cauterized area right nasal turbinate. Patient reports epistaxis has resolved. She has persistent sinus congestion with purulent discharge.  Over the last couple of days she has noticed redness of both eyes, left greater than right. ENT suggested eye redness may be related to recent epistaxis episode. She denies any eye discharge. There is mild burning. She denies any change in vision or loss of vision.  Review of Systems Negative for fever or chills.  She is chronically on oxygen therapy due to pulmonary hypertension  Past Medical History  Diagnosis Date  . History of chicken pox   . Hypertension   . Sclerodermia generalized   . Non Hodgkin's lymphoma     Treated with chemo 2010, felt to be cured.  . Melanoma 1999  . Peripheral arterial occlusive disease   . Neuropathic foot ulcer   . DOE (dyspnea on exertion)     History   Social History  . Marital Status: Married    Spouse Name: N/A    Number of Children: 1  . Years of Education: N/A   Occupational History  . homemaker    Social History Main Topics  . Smoking status: Never Smoker   . Smokeless tobacco: Never Used  . Alcohol Use: Yes     Comment: couple of times a week  . Drug Use: No  . Sexually Active: Not Currently   Other Topics Concern  . Not on file   Social History Narrative  . No narrative on file    Past Surgical History  Procedure Date  . Rt little toe removal 10/1998  . Stent left thigh 3/11  . Tonsillectomy and adenoidectomy   . Small intestine surgery   . Biopsy on cerebellum   . Vein  ligation and stripping 05/16/2012    Procedure: VEIN LIGATION AND STRIPPING;  Surgeon: [name], MD;  Location: [facility] OR;  Service: Vascular;  Laterality: Right;  Irrigation and Debridement right lower leg, ligation of saphenous vein.  . Endovenous ablation saphenous vein w/ laser 07-24-2012    right greater saphenous vein by [name], M.D.   . Endovenous ablation saphenous vein w/ laser 10-16-2012    left greater saphenous vein  by Dr. [name]  . Apligraph 12-04-2102,   12-18-2012    bilateral calves   [name]    Family History  Problem Relation Age of Onset  . Lupus Father   . Lymphoma Father   . Hyperlipidemia Mother   . Cancer Daughter     sarcoma  . Colon cancer      Allergies  Allergen Reactions  . Sulfa Antibiotics     Severe rash with significant peeling of face. No airway involvement per patient  . Penicillins Rash    Current Outpatient Prescriptions on File Prior to Visit  Medication Sig Dispense Refill  . acidophilus (RISAQUAD) CAPS Take 1 capsule by mouth daily.      Marland Kitchen amitriptyline (ELAVIL) 10 MG tablet take 1 tablet by mouth at bedtime  30 tablet  11  . aspirin 81 MG tablet  Take 81 mg by mouth every evening.       Marland Kitchen atorvastatin (LIPITOR) 40 MG tablet Take 1 tablet by mouth every evening.       . Biotin 10 MG TABS Take 1 tablet by mouth every morning.      . bosentan (TRACLEER) 125 MG tablet Take 125 mg by mouth 2 (two) times daily.      . Cholecalciferol (VITAMIN D) 2000 UNITS CAPS Take 1 capsule by mouth daily.       . citalopram (CELEXA) 20 MG tablet Take 20 mg by mouth daily.      . clopidogrel (PLAVIX) 75 MG tablet take 1 tablet by mouth once daily  30 tablet  12  . esomeprazole (NEXIUM) 40 MG capsule Take 1 capsule (40 mg total) by mouth daily before breakfast.  90 capsule  3  . fish oil-omega-3 fatty acids 1000 MG capsule Take 1-2 g by mouth 2 (two) times daily. Takes 1 capsule in the morning and 2 capsules at night      . furosemide  (LASIX) 80 MG tablet Take 1 tablet (80 mg total) by mouth 2 (two) times daily.  60 tablet  6  . gabapentin (NEURONTIN) 300 MG capsule Take 300-600 mg by mouth 2 (two) times daily. One tablet in the morning and two tablets at night      . multivitamin (THERAGRAN) per tablet Take 1 tablet by mouth daily.        . sildenafil (REVATIO) 20 MG tablet Take 4 tablets (80 mg total) by mouth 3 (three) times daily.  240 tablet  6  . spironolactone (ALDACTONE) 25 MG tablet Take 25 mg by mouth every morning.      . vitamin B-12 (CYANOCOBALAMIN) 500 MCG tablet Take 500 mcg by mouth 2 (two) times daily.         BP 132/62  Temp 98.1 F (36.7 C) (Oral)  Wt 143 lb (64.864 kg)       Objective:   Physical Exam  Constitutional: She is oriented to person, place, and time.       Thin, pleasant 62 y/o female, NAD  HENT:  Head: Normocephalic and atraumatic.  Right Ear: External ear normal.  Left Ear: External ear normal.  Mouth/Throat: No oropharyngeal exudate.       Oropharyngeal erythema  Eyes: EOM are normal. Pupils are equal, round, and reactive to light.       Bilateral conjunctival injection left greater than right  Neck: Neck supple.       No neck tenderness  Cardiovascular: Normal rate, regular rhythm and normal heart sounds.   Pulmonary/Chest: Effort normal and breath sounds normal. She has no wheezes.  Neurological: She is alert and oriented to person, place, and time. No cranial nerve deficit.  Skin: Skin is warm and dry.       "Sausage" like appearance to fingers bilaterally          Assessment & Plan:

## 2013-01-14 NOTE — Patient Instructions (Addendum)
Use over the counter saline eye drops as directed Please call our office if your symptoms do not improve or gets worse.

## 2013-01-14 NOTE — Assessment & Plan Note (Signed)
62 year old white female with history of severe pulmonary hypertension and scleroderma presents with signs and symptoms of possible bacterial sinusitis. Treat with cefuroxime 500 mg twice daily for 10 days. She is ready using intranasal saline spray as directed by ENT. Her conjunctivitis maybe reactive to epistaxis versus viral. Patient advised to use over-the-counter saline drops for her eyes. She understands to call office should her eye redness get worse.

## 2013-01-27 DIAGNOSIS — C8590 Non-Hodgkin lymphoma, unspecified, unspecified site: Secondary | ICD-10-CM | POA: Insufficient documentation

## 2013-01-27 DIAGNOSIS — I50810 Right heart failure, unspecified: Secondary | ICD-10-CM | POA: Insufficient documentation

## 2013-01-29 DIAGNOSIS — L97909 Non-pressure chronic ulcer of unspecified part of unspecified lower leg with unspecified severity: Secondary | ICD-10-CM | POA: Insufficient documentation

## 2013-01-30 NOTE — ED Provider Notes (Signed)
Medical screening examination/treatment/procedure(s) were conducted as a shared visit with non-physician practitioner(s) and myself.  I personally evaluated the patient during the encounter  Threasa Beards, MD 01/30/13 1119

## 2013-01-31 HISTORY — PX: TUNNELED VENOUS CATHETER PLACEMENT: SHX818

## 2013-02-02 ENCOUNTER — Encounter (HOSPITAL_COMMUNITY): Payer: BC Managed Care – PPO

## 2013-02-02 ENCOUNTER — Ambulatory Visit (INDEPENDENT_AMBULATORY_CARE_PROVIDER_SITE_OTHER): Payer: BC Managed Care – PPO | Admitting: Internal Medicine

## 2013-02-02 ENCOUNTER — Encounter: Payer: Self-pay | Admitting: Internal Medicine

## 2013-02-02 VITALS — BP 116/56 | Temp 97.5°F | Wt 146.0 lb

## 2013-02-02 DIAGNOSIS — Z23 Encounter for immunization: Secondary | ICD-10-CM

## 2013-02-02 DIAGNOSIS — I2789 Other specified pulmonary heart diseases: Secondary | ICD-10-CM

## 2013-02-02 DIAGNOSIS — I272 Pulmonary hypertension, unspecified: Secondary | ICD-10-CM

## 2013-02-02 DIAGNOSIS — I10 Essential (primary) hypertension: Secondary | ICD-10-CM

## 2013-02-02 MED ORDER — MOMETASONE FUROATE 50 MCG/ACT NA SUSP: 2.0000 | Freq: Every day | NASAL | Status: DC

## 2013-02-02 MED ORDER — AZELASTINE HCL 0.1 % NA SOLN: 2.0000 | Freq: Two times a day (BID) | NASAL | Status: DC

## 2013-02-02 NOTE — Patient Instructions (Signed)
The patient is instructed to continue all medications as prescribed. Schedule followup with check out clerk upon leaving the clinic

## 2013-02-02 NOTE — Progress Notes (Signed)
Subjective:    [name], female    DOB: 1951/03/15, 62 y.o.   MRN: [id number]  HPI  Starting on a new medication for pulmonary HTN associated with SCLeroderma  Review of Systems  Constitutional: Negative for activity change, appetite change and fatigue.  HENT: Negative for ear pain, congestion, neck pain, postnasal drip and sinus pressure.   Eyes: Negative for redness and visual disturbance.  Respiratory: Positive for shortness of breath and stridor. Negative for cough and wheezing.   Cardiovascular: Positive for leg swelling.  Gastrointestinal: Negative for abdominal pain and abdominal distention.  Genitourinary: Negative for dysuria, frequency and menstrual problem.  Musculoskeletal: Positive for joint swelling and arthralgias.  Skin: Negative for rash and wound.  Neurological: Positive for weakness. Negative for dizziness and headaches.  Hematological: Negative for adenopathy. Does not bruise/bleed easily.  Psychiatric/Behavioral: Negative for sleep disturbance and decreased concentration.   Past Medical History  Diagnosis Date  . History of chicken pox   . Hypertension   . Sclerodermia generalized   . Non Hodgkin's lymphoma     Treated with chemo 2010, felt to be cured.  . Melanoma 1999  . Peripheral arterial occlusive disease   . Neuropathic foot ulcer   . DOE (dyspnea on exertion)     History   Social History  . Marital Status: Married    Spouse Name: N/A    Number of Children: 1  . Years of Education: N/A   Occupational History  . homemaker    Social History Main Topics  . Smoking status: Never Smoker   . Smokeless tobacco: Never Used  . Alcohol Use: Yes     Comment: couple of times a week  . Drug Use: No  . Sexually Active: Not Currently   Other Topics Concern  . Not on file   Social History Narrative  . No narrative on file    Past Surgical History  Procedure Date  . Rt little toe removal 10/1998  . Stent left thigh 3/11  .  Tonsillectomy and adenoidectomy   . Small intestine surgery   . Biopsy on cerebellum   . Vein ligation and stripping 05/16/2012    Procedure: VEIN LIGATION AND STRIPPING;  Surgeon: [name], MD;  Location: [facility] OR;  Service: Vascular;  Laterality: Right;  Irrigation and Debridement right lower leg, ligation of saphenous vein.  . Endovenous ablation saphenous vein w/ laser 07-24-2012    right greater saphenous vein by [name], M.D.   . Endovenous ablation saphenous vein w/ laser 10-16-2012    left greater saphenous vein  by Dr. [name]  . Apligraph 12-04-2102,   12-18-2012    bilateral calves   [name]    Family History  Problem Relation Age of Onset  . Lupus Father   . Lymphoma Father   . Hyperlipidemia Mother   . Cancer Daughter     sarcoma  . Colon cancer      Allergies  Allergen Reactions  . Sulfa Antibiotics     Severe rash with significant peeling of face. No airway involvement per patient  . Penicillins Rash    Current Outpatient Prescriptions on File Prior to Visit  Medication Sig Dispense Refill  . acidophilus (RISAQUAD) CAPS Take 1 capsule by mouth daily.      Marland Kitchen amitriptyline (ELAVIL) 10 MG tablet take 1 tablet by mouth at bedtime  30 tablet  11  . aspirin 81 MG tablet Take 81 mg by  mouth every evening.       Marland Kitchen atorvastatin (LIPITOR) 40 MG tablet Take 1 tablet by mouth every evening.       . Biotin 10 MG TABS Take 1 tablet by mouth every morning.      . bosentan (TRACLEER) 125 MG tablet Take 125 mg by mouth 2 (two) times daily.      . Cholecalciferol (VITAMIN D) 2000 UNITS CAPS Take 1 capsule by mouth daily.       . citalopram (CELEXA) 20 MG tablet Take 20 mg by mouth daily.      . clopidogrel (PLAVIX) 75 MG tablet take 1 tablet by mouth once daily  30 tablet  12  . esomeprazole (NEXIUM) 40 MG capsule Take 1 capsule (40 mg total) by mouth daily before breakfast.  90 capsule  3  . fish oil-omega-3 fatty acids 1000 MG capsule Take 1-2 g by mouth  2 (two) times daily. Takes 1 capsule in the morning and 2 capsules at night      . furosemide (LASIX) 80 MG tablet Take 1 tablet (80 mg total) by mouth 2 (two) times daily.  60 tablet  6  . gabapentin (NEURONTIN) 300 MG capsule Take 300-600 mg by mouth 2 (two) times daily. One tablet in the morning and two tablets at night      . multivitamin (THERAGRAN) per tablet Take 1 tablet by mouth daily.        . sildenafil (REVATIO) 20 MG tablet Take 4 tablets (80 mg total) by mouth 3 (three) times daily.  240 tablet  6  . spironolactone (ALDACTONE) 25 MG tablet Take 25 mg by mouth every morning.      . vitamin B-12 (CYANOCOBALAMIN) 500 MCG tablet Take 500 mcg by mouth 2 (two) times daily.         BP 116/56  Temp 97.5 F (36.4 C)  Wt 146 lb (66.225 kg)  SpO2 92%       Objective:   Physical Exam  Nursing note and vitals reviewed. Constitutional: She is oriented to person, place, and time. She appears well-developed and well-nourished. No distress.  HENT:  Head: Normocephalic and atraumatic.  Right Ear: External ear normal.  Left Ear: External ear normal.  Nose: Nose normal.  Mouth/Throat: Oropharynx is clear and moist.  Eyes: Conjunctivae normal and EOM are normal. Pupils are equal, round, and reactive to light.  Neck: Normal range of motion. Neck supple. No JVD present. No tracheal deviation present. No thyromegaly present.  Cardiovascular: Normal rate, regular rhythm and intact distal pulses.   Murmur heard. Pulmonary/Chest: She is in respiratory distress. She has wheezes. She exhibits no tenderness.  Abdominal: Soft. Bowel sounds are normal.  Musculoskeletal: Normal range of motion. She exhibits edema and tenderness.  Lymphadenopathy:    She has no cervical adenopathy.  Neurological: She is alert and oriented to person, place, and time. She has normal reflexes. No cranial nerve deficit.  Skin: Skin is warm and dry. Rash noted. She is not diaphoretic. There is erythema.  Psychiatric:  She has a normal mood and affect. Her behavior is normal.          Assessment & Plan:  Scleroderma rheumatologist in [place] The [place] pulmonologist has good experience and if needed will use [facility]ology HTN stable PAD related to the pulmonary HTN and scleroderma Is to start IV prostacycline for pulmonary HTN Reviewed labs from [facility].

## 2013-02-02 NOTE — Addendum Note (Signed)
Addended by: Miles Costain T on: 02/02/2013 05:10 PM   Modules accepted: Orders

## 2013-02-03 ENCOUNTER — Other Ambulatory Visit: Payer: BC Managed Care – PPO

## 2013-02-03 DIAGNOSIS — I2789 Other specified pulmonary heart diseases: Secondary | ICD-10-CM

## 2013-02-03 LAB — HEPATIC FUNCTION PANEL
ALT: 20 U/L (ref 0–35)
AST: 33 U/L (ref 0–37)
Albumin: 3.7 g/dL (ref 3.5–5.2)
Alkaline Phosphatase: 145 U/L — ABNORMAL HIGH (ref 39–117)
Bilirubin, Direct: 0 mg/dL (ref 0.0–0.3)
Total Bilirubin: 0.7 mg/dL (ref 0.3–1.2)
Total Protein: 7.3 g/dL (ref 6.0–8.3)

## 2013-02-05 ENCOUNTER — Encounter (HOSPITAL_BASED_OUTPATIENT_CLINIC_OR_DEPARTMENT_OTHER): Payer: BC Managed Care – PPO

## 2013-02-05 ENCOUNTER — Encounter (HOSPITAL_BASED_OUTPATIENT_CLINIC_OR_DEPARTMENT_OTHER): Payer: BC Managed Care – PPO | Attending: Internal Medicine

## 2013-02-05 DIAGNOSIS — I872 Venous insufficiency (chronic) (peripheral): Secondary | ICD-10-CM | POA: Insufficient documentation

## 2013-02-05 DIAGNOSIS — I83009 Varicose veins of unspecified lower extremity with ulcer of unspecified site: Secondary | ICD-10-CM | POA: Insufficient documentation

## 2013-02-13 ENCOUNTER — Other Ambulatory Visit: Payer: BC Managed Care – PPO

## 2013-03-05 ENCOUNTER — Other Ambulatory Visit: Payer: BC Managed Care – PPO

## 2013-03-05 ENCOUNTER — Encounter (HOSPITAL_BASED_OUTPATIENT_CLINIC_OR_DEPARTMENT_OTHER): Payer: BC Managed Care – PPO | Attending: Internal Medicine

## 2013-03-05 DIAGNOSIS — L02419 Cutaneous abscess of limb, unspecified: Secondary | ICD-10-CM | POA: Insufficient documentation

## 2013-03-05 DIAGNOSIS — L03119 Cellulitis of unspecified part of limb: Secondary | ICD-10-CM | POA: Insufficient documentation

## 2013-03-05 DIAGNOSIS — I2789 Other specified pulmonary heart diseases: Secondary | ICD-10-CM

## 2013-03-05 DIAGNOSIS — I8390 Asymptomatic varicose veins of unspecified lower extremity: Secondary | ICD-10-CM | POA: Insufficient documentation

## 2013-03-05 DIAGNOSIS — L97809 Non-pressure chronic ulcer of other part of unspecified lower leg with unspecified severity: Secondary | ICD-10-CM | POA: Insufficient documentation

## 2013-03-05 DIAGNOSIS — I872 Venous insufficiency (chronic) (peripheral): Secondary | ICD-10-CM | POA: Insufficient documentation

## 2013-03-05 LAB — HEPATIC FUNCTION PANEL
ALT: 24 U/L (ref 0–35)
AST: 34 U/L (ref 0–37)
Albumin: 3.6 g/dL (ref 3.5–5.2)
Alkaline Phosphatase: 245 U/L — ABNORMAL HIGH (ref 39–117)
Bilirubin, Direct: 0.2 mg/dL (ref 0.0–0.3)
Total Bilirubin: 0.7 mg/dL (ref 0.3–1.2)
Total Protein: 7.4 g/dL (ref 6.0–8.3)

## 2013-03-13 ENCOUNTER — Other Ambulatory Visit: Payer: BC Managed Care – PPO

## 2013-03-16 ENCOUNTER — Other Ambulatory Visit: Payer: Self-pay | Admitting: Internal Medicine

## 2013-03-20 ENCOUNTER — Telehealth: Payer: Self-pay | Admitting: Internal Medicine

## 2013-03-20 ENCOUNTER — Other Ambulatory Visit: Payer: Self-pay | Admitting: *Deleted

## 2013-03-20 NOTE — Telephone Encounter (Signed)
Patient Information:  Caller Name: Dawnmarie  Phone: [phone]  Patient: [name] [name], [name] [name]  Gender: Female  DOB: 1951/05/01  Age: 62 Years  PCP: Benay Pillow (Adults only)  Office Follow Up:  Does the office need to follow up with this patient?: No  Instructions For The Office: N/A   Symptoms  Reason For Call & Symptoms: Pt has sx of thrush. She is familiar with this because she has had thrush following several rounds of abx. Pt is on a medication from [place] called Veletri for her pulmonary hypertension via a [place] 24/7 - and the wound center had prescribed Zyvox x 1 week which was changed to cipro which she completed on Monday night 03/16/13(she has sceraderma). She states her mouth is raw feeling and she has a burning sensation with food/fluids. Pt also has white spots that are not removed with a warm cloth. Pt is requesting Nystatin mouth wash to be be called into [facility] on [address] [phone]  Reviewed Health History In EMR: Yes  Reviewed Medications In EMR: Yes  Reviewed Allergies In EMR: Yes  Reviewed Surgeries / Procedures: Yes  Date of Onset of Symptoms: 03/16/2013  Guideline(s) Used:  No Protocol Available - Sick Adult  Disposition Per Guideline:   Callback by PCP Today  Reason For Disposition Reached:   Nursing judgment  Advice Given:  N/A  Patient Will Follow Care Advice:  YES

## 2013-03-20 NOTE — Telephone Encounter (Signed)
Per dr Arnoldo Morale may have nystasting swish and swallow- 15 ml tid #326m with 1 refill--this was call into pharmacy- unable to find in epic

## 2013-03-22 ENCOUNTER — Other Ambulatory Visit: Payer: Self-pay | Admitting: Internal Medicine

## 2013-03-24 ENCOUNTER — Telehealth (HOSPITAL_COMMUNITY): Payer: Self-pay | Admitting: Cardiology

## 2013-03-24 DIAGNOSIS — I2789 Other specified pulmonary heart diseases: Secondary | ICD-10-CM

## 2013-03-24 NOTE — Telephone Encounter (Signed)
Pt called with update from [place].Htn visit.  O2 has been increased to 8L and she is now taking Veletry po Pt wanted to know if a portable tank can be ordered through [facility] for 8L, her tank now only goes up to 5L and pt feels that is not enough. She gets really SOB and uncomfortable.  Please advise

## 2013-03-24 NOTE — Telephone Encounter (Signed)
Spoke with pt have sent a mess to [facility] to send someone to eval what she has and what she will need, advised pt for 8 L she will need the big tanks

## 2013-03-26 NOTE — Addendum Note (Signed)
Addended by: Scarlette Calico on: 03/26/2013 02:55 PM   Modules accepted: Orders

## 2013-04-02 ENCOUNTER — Encounter (HOSPITAL_BASED_OUTPATIENT_CLINIC_OR_DEPARTMENT_OTHER): Payer: BC Managed Care – PPO | Attending: Internal Medicine

## 2013-04-02 DIAGNOSIS — I872 Venous insufficiency (chronic) (peripheral): Secondary | ICD-10-CM | POA: Insufficient documentation

## 2013-04-02 DIAGNOSIS — I83009 Varicose veins of unspecified lower extremity with ulcer of unspecified site: Secondary | ICD-10-CM | POA: Insufficient documentation

## 2013-04-03 ENCOUNTER — Telehealth: Payer: Self-pay | Admitting: Internal Medicine

## 2013-04-03 MED ORDER — CEFUROXIME AXETIL 500 MG PO TABS: 500.0000 mg | ORAL_TABLET | Freq: Two times a day (BID) | ORAL | Status: DC

## 2013-04-03 NOTE — Telephone Encounter (Signed)
Ok per Dr Arnoldo Morale, rx sent in electronically to [facility], pt aware

## 2013-04-03 NOTE — Telephone Encounter (Signed)
Pt would like a refill of cefUROXime (CEFTIN) 500 MG tablet that Dr Shawna Orleans rx'd her in Jan.  Pt says she has the exact same thing and that med really helped. Pt unable to come in. Pt has pulmonary hypertension and is on O2 24/7.  The tanks are very heavy and it is hard for her to get around.  Pharm: [facility]/ [address]

## 2013-04-09 DIAGNOSIS — Z79899 Other long term (current) drug therapy: Secondary | ICD-10-CM | POA: Insufficient documentation

## 2013-04-10 ENCOUNTER — Other Ambulatory Visit: Payer: BC Managed Care – PPO

## 2013-04-24 ENCOUNTER — Other Ambulatory Visit (HOSPITAL_COMMUNITY): Payer: Self-pay | Admitting: Internal Medicine

## 2013-04-24 DIAGNOSIS — C8589 Other specified types of non-Hodgkin lymphoma, extranodal and solid organ sites: Secondary | ICD-10-CM

## 2013-04-24 DIAGNOSIS — C8339 Primary central nervous system lymphoma: Secondary | ICD-10-CM

## 2013-04-27 ENCOUNTER — Other Ambulatory Visit (INDEPENDENT_AMBULATORY_CARE_PROVIDER_SITE_OTHER): Payer: BC Managed Care – PPO

## 2013-04-27 ENCOUNTER — Telehealth (HOSPITAL_COMMUNITY): Payer: Self-pay | Admitting: *Deleted

## 2013-04-27 DIAGNOSIS — I270 Primary pulmonary hypertension: Secondary | ICD-10-CM

## 2013-04-27 LAB — BASIC METABOLIC PANEL
BUN: 18 mg/dL (ref 6–23)
CO2: 32 mEq/L (ref 19–32)
Calcium: 8.8 mg/dL (ref 8.4–10.5)
Chloride: 97 mEq/L (ref 96–112)
Creatinine, Ser: 0.9 mg/dL (ref 0.4–1.2)
GFR: 64.59 mL/min (ref >60.00)
Glucose, Bld: 128 mg/dL — ABNORMAL HIGH (ref 70–99)
Potassium: 3.4 mEq/L — ABNORMAL LOW (ref 3.5–5.1)
Sodium: 137 mEq/L (ref 135–145)

## 2013-04-27 NOTE — Telephone Encounter (Signed)
Per pt she needs a bmet per Dr Gilles Chiquito at [facility], order placed results will be faxed to [phone]

## 2013-04-28 ENCOUNTER — Telehealth (HOSPITAL_COMMUNITY): Payer: Self-pay | Admitting: *Deleted

## 2013-04-28 DIAGNOSIS — I2789 Other specified pulmonary heart diseases: Secondary | ICD-10-CM

## 2013-04-28 NOTE — Telephone Encounter (Signed)
Message copied by Scarlette Calico on Tue Apr 28, 2013  4:33 PM ------      Message from: Glori Bickers R      Created: Tue Apr 28, 2013 10:15 AM       Give kcl 20 for 3 days. Then stop. Recheck 2 weeks. ------

## 2013-04-29 ENCOUNTER — Ambulatory Visit (HOSPITAL_COMMUNITY): Payer: BC Managed Care – PPO

## 2013-04-30 ENCOUNTER — Encounter (HOSPITAL_BASED_OUTPATIENT_CLINIC_OR_DEPARTMENT_OTHER): Payer: BC Managed Care – PPO | Attending: Internal Medicine

## 2013-04-30 DIAGNOSIS — I8390 Asymptomatic varicose veins of unspecified lower extremity: Secondary | ICD-10-CM | POA: Insufficient documentation

## 2013-04-30 DIAGNOSIS — L97809 Non-pressure chronic ulcer of other part of unspecified lower leg with unspecified severity: Secondary | ICD-10-CM | POA: Insufficient documentation

## 2013-04-30 DIAGNOSIS — I872 Venous insufficiency (chronic) (peripheral): Secondary | ICD-10-CM | POA: Insufficient documentation

## 2013-05-04 ENCOUNTER — Encounter: Payer: Self-pay | Admitting: Internal Medicine

## 2013-05-04 ENCOUNTER — Ambulatory Visit (INDEPENDENT_AMBULATORY_CARE_PROVIDER_SITE_OTHER): Payer: BC Managed Care – PPO | Admitting: Internal Medicine

## 2013-05-04 VITALS — BP 130/60 | HR 84 | Temp 97.8°F | Resp 20 | Ht 68.0 in | Wt 141.0 lb

## 2013-05-04 DIAGNOSIS — I10 Essential (primary) hypertension: Secondary | ICD-10-CM

## 2013-05-04 DIAGNOSIS — R0609 Other forms of dyspnea: Secondary | ICD-10-CM

## 2013-05-04 DIAGNOSIS — R0600 Dyspnea, unspecified: Secondary | ICD-10-CM

## 2013-05-04 DIAGNOSIS — I270 Primary pulmonary hypertension: Secondary | ICD-10-CM

## 2013-05-04 DIAGNOSIS — L940 Localized scleroderma [morphea]: Secondary | ICD-10-CM

## 2013-05-04 DIAGNOSIS — R0989 Other specified symptoms and signs involving the circulatory and respiratory systems: Secondary | ICD-10-CM

## 2013-05-04 NOTE — Patient Instructions (Addendum)
The patient is instructed to continue all medications as prescribed. Schedule followup with check out clerk upon leaving the clinic   Change the neurontin to one twice daily then if tolerated then reduce to one a bed time

## 2013-05-04 NOTE — Progress Notes (Signed)
  Subjective:    [name], female    DOB: Feb 22, 1951, 62 y.o.   MRN: [id number]  HPI    Review of Systems     Objective:   Physical Exam    Severe cyanotic neuropathy     Assessment & Plan:

## 2013-05-04 NOTE — Progress Notes (Signed)
Subjective:    [name], female    DOB: 1951/08/08, 62 y.o.   MRN: [id number]  HPI Patient has pulmonary HTN  And scleroderma She has been at [facility] and has bee on infusion therapy In the past she has responded to higher dose prednisone and even cellcept. She is seeing cardiology at [facility] for her pulmonary HTN    Review of Systems  Constitutional: Positive for activity change and appetite change. Negative for fatigue.  HENT: Negative for ear pain, congestion, neck pain, postnasal drip and sinus pressure.   Eyes: Positive for visual disturbance. Negative for redness.  Respiratory: Positive for shortness of breath and stridor. Negative for cough and wheezing.   Cardiovascular: Positive for leg swelling.  Gastrointestinal: Positive for abdominal pain. Negative for abdominal distention.  Genitourinary: Negative for dysuria, frequency and menstrual problem.  Musculoskeletal: Negative for myalgias, joint swelling and arthralgias.  Skin: Negative for rash and wound.  Neurological: Positive for weakness. Negative for dizziness and headaches.  Hematological: Negative for adenopathy. Does not bruise/bleed easily.  Psychiatric/Behavioral: Negative for sleep disturbance and decreased concentration.   Past Medical History  Diagnosis Date  . History of chicken pox   . Hypertension   . Sclerodermia generalized   . Non Hodgkin's lymphoma     Treated with chemo 2010, felt to be cured.  . Melanoma 1999  . Peripheral arterial occlusive disease   . Neuropathic foot ulcer   . DOE (dyspnea on exertion)     History   Social History  . Marital Status: Married    Spouse Name: N/A    Number of Children: 1  . Years of Education: N/A   Occupational History  . homemaker    Social History Main Topics  . Smoking status: Never Smoker   . Smokeless tobacco: Never Used  . Alcohol Use: Yes     Comment: couple of times a week  . Drug Use: No  . Sexually Active: Not Currently    Other Topics Concern  . Not on file   Social History Narrative  . No narrative on file    Past Surgical History  Procedure Laterality Date  . Rt little toe removal  10/1998  . Stent left thigh  3/11  . Tonsillectomy and adenoidectomy    . Small intestine surgery    . Biopsy on cerebellum    . Vein ligation and stripping  05/16/2012    Procedure: VEIN LIGATION AND STRIPPING;  Surgeon: [name], MD;  Location: [facility] OR;  Service: Vascular;  Laterality: Right;  Irrigation and Debridement right lower leg, ligation of saphenous vein.  . Endovenous ablation saphenous vein w/ laser  07-24-2012    right greater saphenous vein by [name], M.D.   . Endovenous ablation saphenous vein w/ laser  10-16-2012    left greater saphenous vein  by Dr. [name]  . Apligraph  12-04-2102,   12-18-2012    bilateral calves   [name]    Family History  Problem Relation Age of Onset  . Lupus Father   . Lymphoma Father   . Hyperlipidemia Mother   . Cancer Daughter     sarcoma  . Colon cancer      Allergies  Allergen Reactions  . Sulfa Antibiotics     Severe rash with significant peeling of face. No airway involvement per patient  . Penicillins Rash    Current Outpatient Prescriptions on File Prior to Visit  Medication Sig  Dispense Refill  . acidophilus (RISAQUAD) CAPS Take 1 capsule by mouth daily.      Marland Kitchen amitriptyline (ELAVIL) 10 MG tablet take 1 tablet by mouth at bedtime  30 tablet  11  . aspirin 81 MG tablet Take 81 mg by mouth every evening.       Marland Kitchen atorvastatin (LIPITOR) 40 MG tablet Take 1 tablet by mouth every evening.       Marland Kitchen azelastine (ASTELIN) 137 MCG/SPRAY nasal spray Place 2 sprays into the nose 2 (two) times daily. Use in each nostril as directed  30 mL  11  . Biotin 10 MG TABS Take 1 tablet by mouth every morning.      . bosentan (TRACLEER) 125 MG tablet Take 125 mg by mouth 2 (two) times daily.      . cefUROXime (CEFTIN) 500 MG tablet Take 1 tablet (500  mg total) by mouth 2 (two) times daily.  20 tablet  0  . Cholecalciferol (VITAMIN D) 2000 UNITS CAPS Take 1 capsule by mouth daily.       . citalopram (CELEXA) 20 MG tablet Take 20 mg by mouth daily.      . clopidogrel (PLAVIX) 75 MG tablet take 1 tablet by mouth once daily  30 tablet  12  . esomeprazole (NEXIUM) 40 MG capsule Take 1 capsule (40 mg total) by mouth daily before breakfast.  90 capsule  3  . fish oil-omega-3 fatty acids 1000 MG capsule Take 1-2 g by mouth 2 (two) times daily. Takes 1 capsule in the morning and 2 capsules at night      . furosemide (LASIX) 80 MG tablet Take 1 tablet (80 mg total) by mouth 2 (two) times daily.  60 tablet  6  . gabapentin (NEURONTIN) 300 MG capsule Take 300-600 mg by mouth 2 (two) times daily. One tablet in the morning and two tablets at night      . multivitamin (THERAGRAN) per tablet Take 1 tablet by mouth daily.        Marland Kitchen spironolactone (ALDACTONE) 25 MG tablet Take 25 mg by mouth every morning.      . vitamin B-12 (CYANOCOBALAMIN) 500 MCG tablet Take 500 mcg by mouth 2 (two) times daily.       Marland Kitchen atorvastatin (LIPITOR) 40 MG tablet take 1 tablet by mouth once daily  30 tablet  11  . gabapentin (NEURONTIN) 300 MG capsule take 1 capsule by mouth every morning and 2 capsules every evening  90 capsule  11  . mometasone (NASONEX) 50 MCG/ACT nasal spray Place 2 sprays into the nose daily. 1-2 sprays once daily  17 g  11   No current facility-administered medications on file prior to visit.    BP 130/60  Pulse 84  Temp(Src) 97.8 F (36.6 C)  Resp 20  Ht _0  (1.727 m)  Wt 141 lb (63.957 kg)  BMI 21.44 kg/m2       Objective:   Physical Exam  Vitals reviewed. Constitutional: She appears distressed.  HENT:  Head: Normocephalic and atraumatic.  Eyes: Conjunctivae are normal. Pupils are equal, round, and reactive to light.  Neck: Normal range of motion. Neck supple.  Cardiovascular: Normal rate.   Murmur heard. Pulmonary/Chest: She is in  respiratory distress. She has wheezes. She exhibits tenderness.  Abdominal: Soft. Bowel sounds are normal. There is tenderness.  Skin: Skin is warm.          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  Consider steroids?   can this be added to Veletri? Rhem consult may be needed.

## 2013-05-08 ENCOUNTER — Telehealth: Payer: Self-pay | Admitting: Internal Medicine

## 2013-05-08 ENCOUNTER — Other Ambulatory Visit: Payer: BC Managed Care – PPO

## 2013-05-08 MED ORDER — CIPROFLOXACIN HCL 250 MG PO TABS: 250.0000 mg | ORAL_TABLET | Freq: Two times a day (BID) | ORAL | Status: DC

## 2013-05-08 NOTE — Telephone Encounter (Signed)
Patient Information:  Caller Name: [name] [name]  Phone: [phone]  Patient: [name] [name], [name] [name]  Gender: Female  DOB: 1951/03/09  Age: 62 Years  PCP: Benay Pillow (Adults only)  Office Follow Up:  Does the office need to follow up with this patient?: Yes  Instructions For The Office: She is on 02 24/7 and is carrying tanks and is unable to do it by herself and currently IV fusion therapy and reports she cannot come to the office today. She cannot do sitz baths due to wounds on her legs.   Her husband is at work in [place] and has no way to bring specimen into the office. Pharmacy [facility]- [phone].  PLEASE CONTACT PATIENT.  RN Note:  She is on 02 24/7 and is carrying tanks and is unable to do it by herself and currently IV fusion therapy and reports she cannot come to the office today. She cannot do sitz baths due to wounds on her legs.   Her husband is at work in [place] and has no way to bring specimen into the office. Pharmacy [facility]- [phone].  PLEASE CONTACT PATIENT.  Symptoms  Reason For Call & Symptoms: Patient states she is having Frequency and pressure . Last UOP 1 hour ago . Feels as if she can't empty her bladder.  She is a Pulmonary HTN and getting infusion therapy 24/7 since February from [facility] Patient and in office on Monday  Reviewed Health History In EMR: Yes  Reviewed Medications In EMR: Yes  Reviewed Allergies In EMR: Yes  Reviewed Surgeries / Procedures: Yes  Date of Onset of Symptoms: 05/08/2013  Guideline(s) Used:  Urination Pain - Female  Disposition Per Guideline:   See Today in Office  Reason For Disposition Reached:   Age > 50 years  Advice Given:  Fluids:   Drink extra fluids. Drink 8-10 glasses of liquids a day (Reason: to produce a dilute, non-irritating urine).  Cranberry Juice:   Some people think that drinking cranberry juice may help in fighting urinary tract infections. However, there is no good research that has ever  proved this.  Dosage 100% Cranberry Juice: 1 oz (30 ml) twice a day.  Caution: Do not drink more than 16 oz (480 ml). Here is the reason: too much cranberry juice can also be irritating to the bladder.  Warm Saline SITZ Baths to Reduce Pain:  Sit in a warm saline bath for 20 minutes to cleanse the area and to reduce pain. Add 2 oz. of table salt or baking soda to a tub of water.  Call Back If:  You become worse.  RN Overrode Recommendation:  Patient Requests Prescription  She is on 02 24/7 and is carrying tanks and is unable to do it by herself and currently IV fusion therapy and reports she cannot come to the office today. She cannot do sitz baths due to wounds on her legs.   Her husband is at work in [place] and has no way to bring specimen into the office. Pharmacy [facility]- [phone].  PLEASE CONTACT PATIENT.

## 2013-05-08 NOTE — Telephone Encounter (Signed)
Per dr Arnoldo Morale- may have cipro 250 bid for 7 days-pt informed and med sent to pharmacy

## 2013-05-14 ENCOUNTER — Other Ambulatory Visit: Payer: BC Managed Care – PPO

## 2013-05-14 DIAGNOSIS — I2789 Other specified pulmonary heart diseases: Secondary | ICD-10-CM

## 2013-05-14 LAB — HEPATIC FUNCTION PANEL
ALT: 16 U/L (ref 0–35)
AST: 27 U/L (ref 0–37)
Albumin: 3.5 g/dL (ref 3.5–5.2)
Alkaline Phosphatase: 91 U/L (ref 39–117)
Bilirubin, Direct: 0.1 mg/dL (ref 0.0–0.3)
Total Bilirubin: 0.8 mg/dL (ref 0.3–1.2)
Total Protein: 6.7 g/dL (ref 6.0–8.3)

## 2013-05-21 ENCOUNTER — Other Ambulatory Visit: Payer: Self-pay | Admitting: Internal Medicine

## 2013-05-28 ENCOUNTER — Encounter: Payer: Self-pay | Admitting: Emergency Medicine

## 2013-05-28 ENCOUNTER — Encounter (HOSPITAL_COMMUNITY): Payer: Self-pay | Admitting: Family Medicine

## 2013-05-28 ENCOUNTER — Ambulatory Visit (INDEPENDENT_AMBULATORY_CARE_PROVIDER_SITE_OTHER): Payer: BC Managed Care – PPO | Admitting: Emergency Medicine

## 2013-05-28 ENCOUNTER — Emergency Department (HOSPITAL_COMMUNITY)
Admission: EM | Admit: 2013-05-28 | Discharge: 2013-05-29 | Disposition: A | Discharge status: Home / Self Care | Payer: BC Managed Care – PPO | Attending: Emergency Medicine | Admitting: Emergency Medicine

## 2013-05-28 VITALS — BP 96/58 | HR 72 | Temp 98.3°F | Ht 67.0 in | Wt 136.6 lb

## 2013-05-28 DIAGNOSIS — R69 Illness, unspecified: Secondary | ICD-10-CM

## 2013-05-28 DIAGNOSIS — Z9981 Dependence on supplemental oxygen: Secondary | ICD-10-CM | POA: Insufficient documentation

## 2013-05-28 DIAGNOSIS — Z79899 Other long term (current) drug therapy: Secondary | ICD-10-CM | POA: Insufficient documentation

## 2013-05-28 DIAGNOSIS — IMO0002 Reserved for concepts with insufficient information to code with codable children: Secondary | ICD-10-CM | POA: Insufficient documentation

## 2013-05-28 DIAGNOSIS — Z8679 Personal history of other diseases of the circulatory system: Secondary | ICD-10-CM | POA: Insufficient documentation

## 2013-05-28 DIAGNOSIS — Z880 Allergy status to penicillin: Secondary | ICD-10-CM | POA: Insufficient documentation

## 2013-05-28 DIAGNOSIS — R0600 Dyspnea, unspecified: Secondary | ICD-10-CM

## 2013-05-28 DIAGNOSIS — Z872 Personal history of diseases of the skin and subcutaneous tissue: Secondary | ICD-10-CM | POA: Insufficient documentation

## 2013-05-28 DIAGNOSIS — I2789 Other specified pulmonary heart diseases: Secondary | ICD-10-CM | POA: Insufficient documentation

## 2013-05-28 DIAGNOSIS — Z8619 Personal history of other infectious and parasitic diseases: Secondary | ICD-10-CM | POA: Insufficient documentation

## 2013-05-28 DIAGNOSIS — Z87898 Personal history of other specified conditions: Secondary | ICD-10-CM | POA: Insufficient documentation

## 2013-05-28 DIAGNOSIS — Z85820 Personal history of malignant melanoma of skin: Secondary | ICD-10-CM | POA: Insufficient documentation

## 2013-05-28 DIAGNOSIS — Z7982 Long term (current) use of aspirin: Secondary | ICD-10-CM | POA: Insufficient documentation

## 2013-05-28 DIAGNOSIS — Z8709 Personal history of other diseases of the respiratory system: Secondary | ICD-10-CM | POA: Insufficient documentation

## 2013-05-28 DIAGNOSIS — Z8669 Personal history of other diseases of the nervous system and sense organs: Secondary | ICD-10-CM | POA: Insufficient documentation

## 2013-05-28 DIAGNOSIS — R0609 Other forms of dyspnea: Secondary | ICD-10-CM

## 2013-05-28 DIAGNOSIS — R6889 Other general symptoms and signs: Secondary | ICD-10-CM | POA: Insufficient documentation

## 2013-05-28 DIAGNOSIS — R040 Epistaxis: Secondary | ICD-10-CM | POA: Insufficient documentation

## 2013-05-28 DIAGNOSIS — I2729 Other secondary pulmonary hypertension: Secondary | ICD-10-CM

## 2013-05-28 DIAGNOSIS — Z7902 Long term (current) use of antithrombotics/antiplatelets: Secondary | ICD-10-CM | POA: Insufficient documentation

## 2013-05-28 HISTORY — DX: Epistaxis: R04.0

## 2013-05-28 MED ORDER — PREDNISONE 20 MG PO TABS: 40.0000 mg | ORAL_TABLET | Freq: Every day | ORAL | Status: DC

## 2013-05-28 NOTE — Patient Instructions (Addendum)
Please continue your PAH meds as you are taking them Wear your oxygen at all times.  Stay on prednisone 59m until our next visit We will perform a CT scan of the chest in approximately 3 weeks Follow with Dr BLamonte Sakaiin 4 weeks to review the scan and to decide how to adjust your medicines

## 2013-05-28 NOTE — Assessment & Plan Note (Addendum)
-  bosentan - sildenifil - veletri - lasix/aldactone - no anticoagulation currently - o2 at 8L/min

## 2013-05-28 NOTE — Assessment & Plan Note (Addendum)
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 Her dyspnea was thought to relate to her [place] but did not improve with initiation veletri in 2/14, in fact has worsened. Then she was started on prednisone 40 and her SOB has significantly improved - better exertional tolerance, better saturations. She has been on immunosuppression before, but this was stopped. She questions whether she needs to be back on chronic immunosuppression and whether she can be off of veletri.   With regard to the immunosuppression, I would extend the steroid course to 55m qd x 4-5 weeks (a typical ILD flare course) and then taper quickly to 0. Will check CT scan chest at that time [facility] If her sx return then she will likely need chronic cellcept or pred. Would start this and then refer her to rheum to manage long term.   With regard to the veletri, this will probably be a clinical but also hemodynamic question. she likely will not come off veletri until she has either TTE or R heart cath, etc. This will likely be managed primarily by Dr FGilles Chiquito

## 2013-05-28 NOTE — Progress Notes (Signed)
Subjective:    [name], female    DOB: [birth year], 62 y.o.   MRN: [id number]  HPI 62 yo woman, never smoker, hx of scleroderma, Raynaud's, non-Hodgkins lymphoma (treated '10), HTN, PAH originally dx in [place] and treated, but meds d/c'd when she improved. Has been seen in by Dr [name], had R heart cath as below in 03/2012. She has been restarted on therapy, seen both in [place] and at [facility] >> Tracleer + Revatio + Veletri (since 2/'14). She has felt worse on IV therapy, progressive as it titrated up.  Dr Gilles Chiquito scaled back her veletri for this reason. She has had progressive dyspnea - was just started prednisone 93m qd. She has benefited significantly, better exertional tolerance and SpO2.       Underwent RHC on 4/19 as below:  RA = 9  RV = 75/11/14  PA = 71/25 (44)  PCW = 4  Fick cardiac output/index = 3.0/1.7  Thermo CO/CI = 2.7/1.5  PVR = 23.5 [place]  FA sat = 79% on room air (checked 3 times). Sat with finger probe on air 94%  PA sat = 41%, 43%  MW 04/18/12 880 feet    Review of Systems  Constitutional: Positive for appetite change and unexpected weight change. Negative for fever.  HENT: Negative for ear pain, nosebleeds, congestion, sore throat, rhinorrhea, sneezing, trouble swallowing, dental problem, postnasal drip and sinus pressure.   Eyes: Negative for redness and itching.  Respiratory: Positive for cough and shortness of breath. Negative for chest tightness and wheezing.   Cardiovascular: Negative for palpitations and leg swelling.  Gastrointestinal: Negative for nausea and vomiting.  Genitourinary: Negative for dysuria.  Musculoskeletal: Negative for joint swelling.  Skin: Negative for rash.  Neurological: Negative for headaches.  Hematological: Does not bruise/bleed easily.  Psychiatric/Behavioral: Negative for dysphoric mood. The patient is not nervous/anxious.    Past Medical History  Diagnosis Date  . History of chicken pox   . Hypertension   .  Sclerodermia generalized   . Non Hodgkin's lymphoma     Treated with chemo 2010, felt to be cured.  . Melanoma 1999  . Peripheral arterial occlusive disease   . Neuropathic foot ulcer   . DOE (dyspnea on exertion)   . PAH (pulmonary artery hypertension)      Family History  Problem Relation Age of Onset  . Lupus Father   . Lymphoma Father   . Hyperlipidemia Mother   . Cancer Daughter     sarcoma  . Colon cancer       History   Social History  . Marital Status: Married    Spouse Name: N/A    Number of Children: 1  . Years of Education: N/A   Occupational History  . homemaker    Social History Main Topics  . Smoking status: Never Smoker   . Smokeless tobacco: Never Used  . Alcohol Use: Yes     Comment: couple of times a week  . Drug Use: No  . Sexually Active: Not Currently   Other Topics Concern  . Not on file   Social History Narrative  . No narrative on file     Allergies  Allergen Reactions  . Sulfa Antibiotics     Severe rash with significant peeling of face. No airway involvement per patient  . Levaquin  (Levofloxacin In D5w)     Other reaction(s): Other (See Comments) FLU LIKE SYMPTOM  . Penicillins Rash     Outpatient Prescriptions  Prior to Visit  Medication Sig Dispense Refill  . acidophilus (RISAQUAD) CAPS Take 1 capsule by mouth daily.      Marland Kitchen amitriptyline (ELAVIL) 10 MG tablet take 1 tablet by mouth at bedtime  30 tablet  11  . aspirin 81 MG tablet Take 81 mg by mouth every evening.       Marland Kitchen atorvastatin (LIPITOR) 40 MG tablet Take 1 tablet by mouth every evening.       Marland Kitchen atorvastatin (LIPITOR) 40 MG tablet take 1 tablet by mouth once daily  30 tablet  11  . azelastine (ASTELIN) 137 MCG/SPRAY nasal spray Place 2 sprays into the nose 2 (two) times daily. Use in each nostril as directed  30 mL  11  . Biotin 10 MG TABS Take 1 tablet by mouth every morning.      . bosentan (TRACLEER) 125 MG tablet Take 125 mg by mouth 2 (two) times daily.      .  Cholecalciferol (VITAMIN D) 2000 UNITS CAPS Take 1 capsule by mouth daily.       . citalopram (CELEXA) 20 MG tablet Take 20 mg by mouth daily.      . clopidogrel (PLAVIX) 75 MG tablet take 1 tablet by mouth once daily  30 tablet  12  . Epoprostenol Sodium (VELETRI IV) Inject into the vein.      Marland Kitchen esomeprazole (NEXIUM) 40 MG capsule Take 1 capsule (40 mg total) by mouth daily before breakfast.  90 capsule  3  . fish oil-omega-3 fatty acids 1000 MG capsule Take 1-2 g by mouth 2 (two) times daily. Takes 1 capsule in the morning and 2 capsules at night      . furosemide (LASIX) 80 MG tablet Take 1 tablet (80 mg total) by mouth 2 (two) times daily.  60 tablet  6  . gabapentin (NEURONTIN) 300 MG capsule Take 300-600 mg by mouth at bedtime.       . mometasone (NASONEX) 50 MCG/ACT nasal spray Place 2 sprays into the nose daily. 1-2 sprays once daily  17 g  11  . multivitamin (THERAGRAN) per tablet Take 1 tablet by mouth daily.        Marland Kitchen spironolactone (ALDACTONE) 25 MG tablet take 1 tablet by mouth once daily  30 tablet  6  . vitamin B-12 (CYANOCOBALAMIN) 500 MCG tablet Take 500 mcg by mouth 2 (two) times daily.       . cefUROXime (CEFTIN) 500 MG tablet Take 1 tablet (500 mg total) by mouth 2 (two) times daily.  20 tablet  0  . ciprofloxacin (CIPRO) 250 MG tablet Take 1 tablet (250 mg total) by mouth 2 (two) times daily.  14 tablet  0  . gabapentin (NEURONTIN) 300 MG capsule take 1 capsule by mouth every morning and 2 capsules every evening  90 capsule  11   No facility-administered medications prior to visit.       Objective:   Physical Exam Filed Vitals:   05/28/13 1447  BP: 96/58  Pulse: 72  Temp: 98.3 F (36.8 C)  TempSrc: Oral  Height: _0  (1.702 m)  Weight: 136 lb 9.6 oz (61.961 kg)  SpO2: 100%   Gen: Pleasant, thin, in no distress,  normal affect on high flow O2  ENT: No lesions,  mouth clear,  oropharynx clear, no postnasal drip  Neck: No JVD, no TMG, no carotid bruits  Lungs:  No use of accessory muscles, few bibasilar insp crackles  Cardiovascular: RRR, heart sounds normal,  no murmur or gallops, no peripheral edema  Musculoskeletal: No deformities, no cyanosis or clubbing  Neuro: alert, non focal  Skin: significant changes from scleroderma on hands, fingers       Assessment & Plan:  DOE (dyspnea on exertion) Interesting case - 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 Her dyspnea was thought to relate to her [place] but did not improve with initiation veletri in 2/14, in fact has worsened. Then she was started on prednisone 40 and her SOB has significantly improved - better exertional tolerance, better saturations. She has been on immunosuppression before, but this was stopped. She questions whether she needs to be back on chronic immunosuppression and whether she can be off of veletri.   With regard to the immunosuppression, I would extend the steroid course to 39m qd x 4-5 weeks (a typical ILD flare course) and then taper quickly to 0. Will check CT scan chest at that time [facility] If her sx return then she will likely need chronic cellcept or pred. Would start this and then refer her to rheum to manage long term.   With regard to the veletri, this will probably be a clinical but also hemodynamic question. she likely will not come off veletri until she has either TTE or R heart cath, etc. This will likely be managed primarily by Dr FGilles Chiquito Pulmonary hypertension associated with systemic disorder - bosentan - sildenifil - veletri - lasix/aldactone - no anticoagulation currently - o2 at 8L/min

## 2013-05-28 NOTE — ED Notes (Signed)
Patient states that she has had a nosebleed for the past 3 hours. States that bleeding is primarily from the right side. Has a history of nosebleeds. Currently takes Plavix daily.

## 2013-05-29 LAB — BASIC METABOLIC PANEL
BUN: 21 mg/dL (ref 6–23)
CO2: 33 mEq/L — ABNORMAL HIGH (ref 19–32)
Calcium: 8.7 mg/dL (ref 8.4–10.5)
Chloride: 96 mEq/L (ref 96–112)
Creatinine, Ser: 0.78 mg/dL (ref 0.50–1.10)
GFR calc Af Amer: >90 mL/min (ref >90)
GFR calc non Af Amer: 87 mL/min — ABNORMAL LOW (ref >90)
Glucose, Bld: 171 mg/dL — ABNORMAL HIGH (ref 70–99)
Potassium: 3.4 mEq/L — ABNORMAL LOW (ref 3.5–5.1)
Sodium: 137 mEq/L (ref 135–145)

## 2013-05-29 LAB — PROTIME-INR
INR: 0.96 (ref 0.00–1.49)
Prothrombin Time: 12.7 seconds (ref 11.6–15.2)

## 2013-05-29 LAB — CBC
HCT: 36.6 % (ref 36.0–46.0)
Hemoglobin: 11.2 g/dL — ABNORMAL LOW (ref 12.0–15.0)
MCH: 27 pg (ref 26.0–34.0)
MCHC: 30.6 g/dL (ref 30.0–36.0)
MCV: 88.2 fL (ref 78.0–100.0)
Platelets: 220 10*3/uL (ref 150–400)
RBC: 4.15 MIL/uL (ref 3.87–5.11)
RDW: 16.8 % — ABNORMAL HIGH (ref 11.5–15.5)
WBC: 5.5 10*3/uL (ref 4.0–10.5)

## 2013-05-29 LAB — APTT: aPTT: 43 seconds — ABNORMAL HIGH (ref 24–37)

## 2013-05-29 NOTE — ED Provider Notes (Signed)
Medical screening examination/treatment/procedure(s) were performed by non-physician practitioner and as supervising physician I was immediately available for consultation/collaboration.  Kalman Drape, MD 05/29/13 [phone]

## 2013-05-29 NOTE — ED Provider Notes (Signed)
Medical screening examination/treatment/procedure(s) were performed by non-physician practitioner and as supervising physician I was immediately available for consultation/collaboration.  Kalman Drape, MD 05/29/13 [phone]

## 2013-05-29 NOTE — ED Provider Notes (Signed)
History     CSN: [id number]  Arrival date & time 05/28/13  2331   First MD Initiated Contact with Patient 05/29/13 0003      Chief Complaint  Patient presents with  . Epistaxis    (Consider location/radiation/quality/duration/timing/severity/associated sxs/prior treatment) Patient is a 62 y.o. female presenting with nosebleeds. The history is provided by the patient, the spouse and medical records. No language interpreter was used.  Epistaxis Location:  R nare Severity:  Moderate Timing:  Constant Progression:  Unchanged Chronicity:  Recurrent Context: anticoagulants and home oxygen   Context: not aspirin use, not BiPAP, not bleeding disorder, not CPAP, not drug use, not elevation change, not foreign body, not hypertension, not nose picking, not recent infection, not thrombocytopenia, not trauma and not weather change   Relieved by:  Nothing Worsened by:  Sneezing Ineffective treatments:  Applying pressure Associated symptoms: blood in oropharynx   Associated symptoms: no congestion, no cough, no dizziness, no facial pain, no fever, no headaches, no sinus pain, no sneezing, no sore throat and no syncope   Risk factors: frequent nosebleeds   Risk factors: no alcohol use, no allergies, no change in medication, no head and neck surgery, no head and neck tumor, no intranasal steroids, no liver disease, no radiation treatment, no recent chemotherapy, no recent nasal surgery and no sinus problems     [name] [name] is a 62 y.o. female  with a hx of pulmonary hypertension (on O2) presents to the Emergency Department complaining of gradual, persistent, progressively worsening epistaxis beginning this evening from bilateral nares but right more than L.  She wears nasal cannula oxygen and she states that this has happened in the past due to nasal passages drying out from the O2.  She has been using the [place] through her mouth to help her breath. She was seen by ENT last year for a cautery and has  had no problems since that time.  There are no other associated systemic symptoms.  She has tried holding pressure without relief.  Nothing makes it better or worse.  Pt denies other bleeding or bruising, nasal trauma, fever, chills, headache, neck pain.  .      Past Medical History  Diagnosis Date  . History of chicken pox   . Hypertension   . Sclerodermia generalized   . Non Hodgkin's lymphoma     Treated with chemo 2010, felt to be cured.  . Melanoma 1999  . Peripheral arterial occlusive disease   . Neuropathic foot ulcer   . DOE (dyspnea on exertion)   . PAH (pulmonary artery hypertension)   . Epistaxis     Past Surgical History  Procedure Laterality Date  . Rt little toe removal  10/1998  . Stent left thigh  3/11  . Tonsillectomy and adenoidectomy    . Small intestine surgery    . Biopsy on cerebellum    . Vein ligation and stripping  05/16/2012    Procedure: VEIN LIGATION AND STRIPPING;  Surgeon: [name], MD;  Location: [facility] OR;  Service: Vascular;  Laterality: Right;  Irrigation and Debridement right lower leg, ligation of saphenous vein.  . Endovenous ablation saphenous vein w/ laser  07-24-2012    right greater saphenous vein by [name], M.D.   . Endovenous ablation saphenous vein w/ laser  10-16-2012    left greater saphenous vein  by Dr. [name]  . Apligraph  12-04-2102,   12-18-2012    bilateral calves   [name]    Family History  Problem Relation Age of Onset  . Lupus Father   . Lymphoma Father   . Hyperlipidemia Mother   . Cancer Daughter     sarcoma  . Colon cancer      History  Substance Use Topics  . Smoking status: Never Smoker   . Smokeless tobacco: Never Used  . Alcohol Use: Yes     Comment: Occasional    OB History   Grav Para Term Preterm Abortions TAB SAB Ect Mult Living                  Review of Systems  Constitutional: Negative for fever, diaphoresis, appetite change, fatigue and unexpected weight change.   HENT: Positive for nosebleeds. Negative for congestion, sore throat, sneezing, mouth sores and neck stiffness.   Eyes: Negative for visual disturbance.  Respiratory: Negative for cough, chest tightness, shortness of breath and wheezing.   Cardiovascular: Negative for chest pain and syncope.  Gastrointestinal: Negative for nausea, vomiting, abdominal pain, diarrhea and constipation.  Endocrine: Negative for polydipsia, polyphagia and polyuria.  Genitourinary: Negative for dysuria, urgency, frequency and hematuria.  Musculoskeletal: Negative for back pain.  Skin: Negative for rash.  Allergic/Immunologic: Negative for immunocompromised state.  Neurological: Negative for dizziness, syncope, light-headedness and headaches.  Hematological: Does not bruise/bleed easily.  Psychiatric/Behavioral: Negative for sleep disturbance. The patient is not nervous/anxious.     Allergies  Sulfa antibiotics; Levaquin; and Penicillins  Home Medications   Current Outpatient Rx  Name  Route  Sig  Dispense  Refill  . acidophilus (RISAQUAD) CAPS   Oral   Take 1 capsule by mouth every morning.          Marland Kitchen amitriptyline (ELAVIL) 10 MG tablet      take 1 tablet by mouth at bedtime   30 tablet   11   . aspirin 81 MG tablet   Oral   Take 81 mg by mouth every evening.          Marland Kitchen azelastine (ASTELIN) 137 MCG/SPRAY nasal spray   Nasal   Place 2 sprays into the nose 2 (two) times daily as needed for rhinitis. Use in each nostril as directed         . Biotin 10 MG TABS   Oral   Take 1 tablet by mouth every morning.         . bosentan (TRACLEER) 125 MG tablet   Oral   Take 125 mg by mouth 2 (two) times daily.         . cholecalciferol (VITAMIN D) 1000 UNITS tablet   Oral   Take 2,000 Units by mouth every morning.         . citalopram (CELEXA) 20 MG tablet   Oral   Take 20 mg by mouth every evening.          . clopidogrel (PLAVIX) 75 MG tablet   Oral   Take 75 mg by mouth every  morning.         Marland Kitchen Epoprostenol Sodium (VELETRI IV)   Intravenous   Inject 3 microL into the vein continuous. Diluted to 67m/24 hours rate in pump         . esomeprazole (NEXIUM) 40 MG capsule   Oral   Take 40 mg by mouth daily with lunch.         . fish oil-omega-3 fatty acids 1000 MG capsule   Oral   Take 1-2 g by mouth  2 (two) times daily. Takes 1 capsule in the morning and 2 capsules at night         . furosemide (LASIX) 80 MG tablet   Oral   Take 1 tablet (80 mg total) by mouth 2 (two) times daily.   60 tablet   6   . gabapentin (NEURONTIN) 300 MG capsule   Oral   Take 300 mg by mouth at bedtime.          . mometasone (NASONEX) 50 MCG/ACT nasal spray   Nasal   Place 2 sprays into the nose daily. 1-2 sprays once daily   17 g   11   . multivitamin (THERAGRAN) per tablet   Oral   Take 1 tablet by mouth 2 (two) times daily.          . predniSONE (DELTASONE) 20 MG tablet   Oral   Take 40 mg by mouth every evening.         . sildenafil (REVATIO) 20 MG tablet   Oral   Take 40 mg by mouth 3 (three) times daily.         Marland Kitchen spironolactone (ALDACTONE) 25 MG tablet   Oral   Take 25 mg by mouth every morning.         . vitamin B-12 (CYANOCOBALAMIN) 500 MCG tablet   Oral   Take 500 mcg by mouth every morning.          Marland Kitchen atorvastatin (LIPITOR) 40 MG tablet   Oral   Take 1 tablet by mouth every evening.            BP 116/67  Pulse 85  Resp 20  Ht _0  (1.702 m)  Wt 134 lb (60.782 kg)  BMI 20.98 kg/m2  SpO2 95%  Physical Exam  Nursing note and vitals reviewed. Constitutional: She is oriented to person, place, and time. She appears well-developed and well-nourished. No distress.  HENT:  Head: Normocephalic and atraumatic.  Right Ear: Tympanic membrane, external ear and ear canal normal.  Left Ear: Tympanic membrane, external ear and ear canal normal.  Nose: Epistaxis is observed.  Mouth/Throat: Uvula is midline, oropharynx is clear and  moist and mucous membranes are normal. No edematous. No oropharyngeal exudate, posterior oropharyngeal edema, posterior oropharyngeal erythema or tonsillar abscesses.  Oozing from the right nare seen; no definitive source of bleeding  Eyes: Conjunctivae are normal. Pupils are equal, round, and reactive to light. No scleral icterus.  Neck: Normal range of motion. Neck supple.  Cardiovascular: Normal rate, regular rhythm, normal heart sounds and intact distal pulses.   Pulmonary/Chest: Effort normal and breath sounds normal. No respiratory distress. She has no wheezes.  Abdominal: Soft. Bowel sounds are normal. She exhibits no mass. There is no tenderness. There is no rebound and no guarding.  Musculoskeletal: Normal range of motion. She exhibits no edema.  Lymphadenopathy:    She has no cervical adenopathy.  Neurological: She is alert and oriented to person, place, and time. She exhibits normal muscle tone. Coordination normal.  Speech is clear and goal oriented Moves extremities without ataxia  Skin: Skin is warm and dry. No rash noted. She is not diaphoretic. No erythema.  Psychiatric: She has a normal mood and affect.    ED Course  EPISTAXIS MANAGEMENT Date/Time: 05/29/2013 1:07 AM Performed by: Abigail Butts Authorized by: Abigail Butts Consent: Verbal consent obtained. Risks and benefits: risks, benefits and alternatives were discussed Consent given by: patient Patient understanding: patient states understanding of  the procedure being performed Patient consent: the patient's understanding of the procedure matches consent given Procedure consent: procedure consent matches procedure scheduled Relevant documents: relevant documents present and verified Site marked: the operative site was marked Required items: required blood products, implants, devices, and special equipment available Patient identity confirmed: verbally with patient and arm band Time out: Immediately  prior to procedure a "time out" was called to verify the correct patient, procedure, equipment, support staff and site/side marked as required. Preparation: Patient was prepped and draped in the usual sterile fashion. Patient sedated: no Treatment site: right anterior Repair method: nasal balloon and anterior pack Post-procedure assessment: bleeding stopped Treatment complexity: simple Patient tolerance: Patient tolerated the procedure well with no immediate complications.   (including critical care time)  Labs Reviewed  CBC - Abnormal; Notable for the following:    Hemoglobin 11.2 (*)    RDW 16.8 (*)    All other components within normal limits  BASIC METABOLIC PANEL - Abnormal; Notable for the following:    Potassium 3.4 (*)    CO2 33 (*)    Glucose, Bld 171 (*)    GFR calc non Af Amer 87 (*)    All other components within normal limits  PROTIME-INR  APTT   No results found.   1. Epistaxis       MDM  [name] [name] presents with epistaxis.  Pt has a hx of same and risk factors include use of plavix, astelin, nasonex, and revalto.  Pt with persistent epistaxis in spite of home treatments.   Pt's right nare packed with cessation of epistaxis.  Labs pending.  Pt sees  Dr Erik Obey  with ENT.  Pt is to follow-up for packing removal and re-evaluation.  Delos Haring, PA-C will follow-labs and dispo.  Dr. Linton Flemings is aware of patient.         Jarrett Soho Acelyn Basham, PA-C 05/29/13 [phone]

## 2013-05-29 NOTE — ED Provider Notes (Signed)
Patient hand off from [facility], PA-C.   Patient here for epistaxis and was packed by Jarrett Soho. Currently waiting for labs to reassure patients coags and blood counts are stable.  Results for orders placed during the hospital encounter of 05/28/13  CBC      Result Value Range   WBC 5.5  4.0 - 10.5 K/uL   RBC 4.15  3.87 - 5.11 MIL/uL   Hemoglobin 11.2 (*) 12.0 - 15.0 g/dL   HCT 36.6  36.0 - 46.0 %   MCV 88.2  78.0 - 100.0 fL   MCH 27.0  26.0 - 34.0 pg   MCHC 30.6  30.0 - 36.0 g/dL   RDW 16.8 (*) 11.5 - 15.5 %   Platelets 220  150 - 400 K/uL  BASIC METABOLIC PANEL      Result Value Range   Sodium 137  135 - 145 mEq/L   Potassium 3.4 (*) 3.5 - 5.1 mEq/L   Chloride 96  96 - 112 mEq/L   CO2 33 (*) 19 - 32 mEq/L   Glucose, Bld 171 (*) 70 - 99 mg/dL   BUN 21  6 - 23 mg/dL   Creatinine, Ser 0.78  0.50 - 1.10 mg/dL   Calcium 8.7  8.4 - 10.5 mg/dL   GFR calc non Af Amer 87 (*) >90 mL/min   GFR calc Af Amer >90  >90 mL/min  PROTIME-INR      Result Value Range   Prothrombin Time 12.7  11.6 - 15.2 seconds   INR 0.96  0.00 - 1.49  APTT      Result Value Range   aPTT 43 (*) 24 - 37 seconds   No results found.    Prior to discharge the patient had a large clot going down the back of her throat which was causing her to gag and her O2 sats to decline. I used Hurricane spray to numb the back of her throat and with forceps removed a very large clot from her oralpharynx. Patients sats improved up to 92% and she is no longer gagging. Pt safe to dc at this time and will see her ENT doctor, Dr. Erik Obey, tomorrow. Discussed with Dr. Sharol Given prior to dc.  Pt has been advised of the symptoms that warrant their return to the ED. Patient has voiced understanding and has agreed to follow-up with the PCP or specialist.   Linus Mako, PA-C 05/29/13 0321

## 2013-06-04 ENCOUNTER — Encounter (HOSPITAL_BASED_OUTPATIENT_CLINIC_OR_DEPARTMENT_OTHER): Payer: BC Managed Care – PPO | Attending: Internal Medicine

## 2013-06-04 ENCOUNTER — Telehealth: Payer: Self-pay | Admitting: Internal Medicine

## 2013-06-04 DIAGNOSIS — L97919 Non-pressure chronic ulcer of unspecified part of right lower leg with unspecified severity: Secondary | ICD-10-CM | POA: Insufficient documentation

## 2013-06-04 DIAGNOSIS — I83219 Varicose veins of right lower extremity with both ulcer of unspecified site and inflammation: Secondary | ICD-10-CM | POA: Insufficient documentation

## 2013-06-04 NOTE — Telephone Encounter (Signed)
Pt would like bonnie to return her call concerning medications

## 2013-06-05 ENCOUNTER — Other Ambulatory Visit: Payer: Self-pay | Admitting: *Deleted

## 2013-06-05 NOTE — Telephone Encounter (Signed)
Per dr Prudy Feeler lipitor and add 300 neurontin at noon

## 2013-06-05 NOTE — Telephone Encounter (Signed)
Read in paper lipitor can cause neuropathy.should she continue- and she is only taking 1 neurontin at night and then she should go back on more-please advise

## 2013-06-11 ENCOUNTER — Other Ambulatory Visit: Payer: BC Managed Care – PPO

## 2013-06-11 DIAGNOSIS — I2789 Other specified pulmonary heart diseases: Secondary | ICD-10-CM

## 2013-06-11 LAB — HEPATIC FUNCTION PANEL
ALT: 21 U/L (ref 0–35)
AST: 22 U/L (ref 0–37)
Albumin: 3.9 g/dL (ref 3.5–5.2)
Alkaline Phosphatase: 67 U/L (ref 39–117)
Bilirubin, Direct: 0 mg/dL (ref 0.0–0.3)
Total Bilirubin: 0.6 mg/dL (ref 0.3–1.2)
Total Protein: 6.8 g/dL (ref 6.0–8.3)

## 2013-06-12 ENCOUNTER — Other Ambulatory Visit: Payer: BC Managed Care – PPO

## 2013-06-15 ENCOUNTER — Ambulatory Visit (INDEPENDENT_AMBULATORY_CARE_PROVIDER_SITE_OTHER)
Admission: RE | Admit: 2013-06-15 | Discharge: 2013-06-15 | Disposition: A | Discharge status: Home / Self Care | Payer: BC Managed Care – PPO | Source: Ambulatory Visit | Attending: Emergency Medicine | Admitting: Emergency Medicine

## 2013-06-15 DIAGNOSIS — R0989 Other specified symptoms and signs involving the circulatory and respiratory systems: Secondary | ICD-10-CM

## 2013-06-15 DIAGNOSIS — R0609 Other forms of dyspnea: Secondary | ICD-10-CM

## 2013-06-15 DIAGNOSIS — R0600 Dyspnea, unspecified: Secondary | ICD-10-CM

## 2013-06-24 ENCOUNTER — Ambulatory Visit (INDEPENDENT_AMBULATORY_CARE_PROVIDER_SITE_OTHER): Payer: BC Managed Care – PPO | Admitting: Emergency Medicine

## 2013-06-24 ENCOUNTER — Encounter: Payer: Self-pay | Admitting: Emergency Medicine

## 2013-06-24 VITALS — BP 118/62 | HR 72 | Temp 98.7°F | Ht 67.5 in | Wt 124.4 lb

## 2013-06-24 DIAGNOSIS — L940 Localized scleroderma [morphea]: Secondary | ICD-10-CM

## 2013-06-24 DIAGNOSIS — R69 Illness, unspecified: Secondary | ICD-10-CM

## 2013-06-24 DIAGNOSIS — I2789 Other specified pulmonary heart diseases: Secondary | ICD-10-CM

## 2013-06-24 DIAGNOSIS — I2729 Other secondary pulmonary hypertension: Secondary | ICD-10-CM

## 2013-06-24 NOTE — Patient Instructions (Addendum)
Your CT scan shows residual interstitial inflammatory changes and some scar formation, likely from scleroderma and your prior pneumonias.  We will continue prednisone at 50m for the next week, then decrease to 343mfor 2 weeks, then to 2063mor two weeks, then to 62m95mFollow with Dr ByruLamonte Sakaiabout 1 month Follow with Dr FortGilles Chiquitoplanned to discuss whether to stay on Veletri. You may need to have testing such as an echocardiogram or a right heart catherization to help make this decision. [facility] that it may be difficult to differentiate the effects of the Veletri from the Prednisone.  Continue your sildenifil and bosentin.  Get labwork at Dr Bensimhon's office as planned

## 2013-06-24 NOTE — Assessment & Plan Note (Signed)
Will try tapering the pred as we would with a standard ILD flare. Hopefully she will tolerate this. If she worsens then she will likely need chronic steroid-sparing agent. F/u in 1 month to see if she tolerates decrease in pred.

## 2013-06-24 NOTE — Assessment & Plan Note (Signed)
She will follow with Dr Gilles Chiquito as planned to discuss whether to stay on Veletri. She is fairly convinced that the [facility] didn't benefit her, but it doesn't seem clear cut since both Pred and Veletri were started within a few months of each other and were overlapped. She indicates that she wants to stop it. I suspect that she will need to have PAP's measured in order to decide. She can get this done in [place] or [place], but the ultimate decisions about Tresa Res should be made at [facility] clinic.

## 2013-06-24 NOTE — Progress Notes (Signed)
Subjective:    [name], female    DOB: 12-26-1951, 62 y.o.   MRN: [id number] HPI 62 yo woman, never smoker, hx of scleroderma, Raynaud's, non-Hodgkins lymphoma (treated '10), HTN, PAH originally dx in [place] and treated, but meds d/c'd when she improved. Has been seen in by Dr [name], had R heart cath as below in 03/2012. She has been restarted on therapy, seen both in [facility] and at [facility] >> Tracleer + Revatio + Veletri (since 2/'14). She has felt worse on IV therapy, progressive as it titrated up.  Dr Gilles Chiquito scaled back her veletri for this reason. She has had progressive dyspnea - was just started prednisone 9m qd. She has benefited significantly, better exertional tolerance and SpO2.    Underwent RHC on 04/18/12 as below:  RA = 9  RV = 75/11/14  PA = 71/25 (44)  PCW = 4  Fick cardiac output/index = 3.0/1.7  Thermo CO/CI = 2.7/1.5  PVR = 23.5 [place]  FA sat = 79% on room air (checked 3 times). Sat with finger probe on air 94%  PA sat = 41%, 43%  MW 04/18/12 880 feet  ROV 06/24/13 -- f/u for scleroderma, Raynaud's, non-Hodgkins lymphoma (treated '10), HTN, PAH on IV therapy. 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 Aspirating).  She states that she has felt much better with good functional capacity, less dyspnea, no CP since she started prednisone. No overt aspiration sx although she is high risk. [facility]    Review of Systems  Constitutional: Negative for fever, appetite change and unexpected weight change.  HENT: Negative for ear pain, nosebleeds, congestion, sore throat, rhinorrhea, sneezing, trouble swallowing, dental problem, postnasal drip and sinus pressure.   Eyes: Negative for redness and itching.  Respiratory: Positive for shortness of breath. Negative for cough, chest tightness and wheezing.   Cardiovascular: Negative for palpitations and leg  swelling.  Gastrointestinal: Negative for nausea and vomiting.  Genitourinary: Negative for dysuria.  Musculoskeletal: Negative for joint swelling.  Skin: Negative for rash.  Neurological: Negative for headaches.  Hematological: Does not bruise/bleed easily.  Psychiatric/Behavioral: Negative for dysphoric mood. The patient is not nervous/anxious.        Objective:   Physical Exam Filed Vitals:   06/24/13 1626  BP: 118/62  Pulse: 72  Temp: 98.7 F (37.1 C)  TempSrc: Oral  Height: 5' 7.5" (1.715 m)  Weight: 124 lb 6.4 oz (56.427 kg)  SpO2: 100%   Gen: Pleasant, thin, in no distress,  normal affect on high flow O2  ENT: No lesions,  mouth clear,  oropharynx clear, no postnasal drip  Neck: No JVD, no TMG, no carotid bruits  Lungs: No use of accessory muscles, few bibasilar insp crackles  Cardiovascular: RRR, heart sounds normal, no murmur or gallops, no peripheral edema  Musculoskeletal: No deformities, no cyanosis or clubbing  Neuro: alert, non focal  Skin: significant changes from scleroderma on arms, hands, fingers       Assessment & Plan:  Pulmonary hypertension associated with systemic disorder She will follow with Dr FGilles Chiquitoas planned to discuss whether to stay on Veletri. She is fairly convinced that [facility], but it doesn't seem clear cut since both Pred and Veletri were started within a few months of each other and were overlapped. She indicates that she wants to stop it. I  suspect that she will need to have PAP's measured in order to decide. She can get this done in [place] or [place], but the ultimate decisions about Tresa Res should be made at [facility] clinic.   Scleroderma, circumscribed or localized Will try tapering the pred as we would with a standard ILD flare. Hopefully she will tolerate this. If she worsens then she will likely need chronic steroid-sparing agent. F/u in 1 month to see if she tolerates decrease in pred.

## 2013-07-02 ENCOUNTER — Encounter (HOSPITAL_BASED_OUTPATIENT_CLINIC_OR_DEPARTMENT_OTHER): Payer: BC Managed Care – PPO | Attending: General Surgery

## 2013-07-02 DIAGNOSIS — L97909 Non-pressure chronic ulcer of unspecified part of unspecified lower leg with unspecified severity: Secondary | ICD-10-CM | POA: Insufficient documentation

## 2013-07-02 DIAGNOSIS — I83009 Varicose veins of unspecified lower extremity with ulcer of unspecified site: Secondary | ICD-10-CM | POA: Insufficient documentation

## 2013-07-06 ENCOUNTER — Ambulatory Visit (HOSPITAL_COMMUNITY)
Admission: RE | Admit: 2013-07-06 | Discharge: 2013-07-06 | Disposition: A | Discharge status: Home / Self Care | Payer: BC Managed Care – PPO | Source: Ambulatory Visit | Attending: Internal Medicine | Admitting: Internal Medicine

## 2013-07-06 ENCOUNTER — Telehealth: Payer: Self-pay | Admitting: Internal Medicine

## 2013-07-06 DIAGNOSIS — C8589 Other specified types of non-Hodgkin lymphoma, extranodal and solid organ sites: Secondary | ICD-10-CM

## 2013-07-06 MED ORDER — GADOBENATE DIMEGLUMINE 529 MG/ML IV SOLN
10.0000 mL | Freq: Once | INTRAVENOUS | Status: AC
  Administered 2013-07-06: 10 mL via INTRAVENOUS

## 2013-07-06 NOTE — Telephone Encounter (Signed)
Received 7 pages from [place], sent to Dr. Benay Pillow at [facility]. 07/06/13/ss

## 2013-07-10 ENCOUNTER — Other Ambulatory Visit: Payer: BC Managed Care – PPO

## 2013-07-14 ENCOUNTER — Ambulatory Visit (HOSPITAL_COMMUNITY)
Admission: RE | Admit: 2013-07-14 | Discharge: 2013-07-14 | Disposition: A | Discharge status: Home / Self Care | Payer: BC Managed Care – PPO | Source: Ambulatory Visit | Attending: Internal Medicine | Admitting: Internal Medicine

## 2013-07-14 ENCOUNTER — Other Ambulatory Visit (INDEPENDENT_AMBULATORY_CARE_PROVIDER_SITE_OTHER): Payer: BC Managed Care – PPO

## 2013-07-14 ENCOUNTER — Other Ambulatory Visit (HOSPITAL_COMMUNITY): Payer: Self-pay

## 2013-07-14 DIAGNOSIS — K116 Mucocele of salivary gland: Secondary | ICD-10-CM | POA: Insufficient documentation

## 2013-07-14 DIAGNOSIS — I2789 Other specified pulmonary heart diseases: Secondary | ICD-10-CM

## 2013-07-14 DIAGNOSIS — G9389 Other specified disorders of brain: Secondary | ICD-10-CM | POA: Insufficient documentation

## 2013-07-14 DIAGNOSIS — M349 Systemic sclerosis, unspecified: Secondary | ICD-10-CM | POA: Insufficient documentation

## 2013-07-14 DIAGNOSIS — C8339 Primary central nervous system lymphoma: Secondary | ICD-10-CM | POA: Insufficient documentation

## 2013-07-14 DIAGNOSIS — C8589 Other specified types of non-Hodgkin lymphoma, extranodal and solid organ sites: Secondary | ICD-10-CM | POA: Insufficient documentation

## 2013-07-14 LAB — BASIC METABOLIC PANEL
BUN: 30 mg/dL — ABNORMAL HIGH (ref 6–23)
CO2: 34 mEq/L — ABNORMAL HIGH (ref 19–32)
Calcium: 9.2 mg/dL (ref 8.4–10.5)
Chloride: 93 mEq/L — ABNORMAL LOW (ref 96–112)
Creatinine, Ser: 1 mg/dL (ref 0.4–1.2)
GFR: 57.37 mL/min — ABNORMAL LOW (ref >60.00)
Glucose, Bld: 100 mg/dL — ABNORMAL HIGH (ref 70–99)
Potassium: 3.7 mEq/L (ref 3.5–5.1)
Sodium: 137 mEq/L (ref 135–145)

## 2013-07-14 LAB — CREATININE, SERUM
Creatinine, Ser: 1.01 mg/dL (ref 0.50–1.10)
GFR calc Af Amer: 67 mL/min — ABNORMAL LOW (ref >90)
GFR calc non Af Amer: 58 mL/min — ABNORMAL LOW (ref >90)

## 2013-07-14 MED ORDER — GADOBENATE DIMEGLUMINE 529 MG/ML IV SOLN
12.0000 mL | Freq: Once | INTRAVENOUS | Status: AC
  Administered 2013-07-14: 12 mL via INTRAVENOUS

## 2013-07-21 ENCOUNTER — Other Ambulatory Visit (HOSPITAL_COMMUNITY): Payer: Self-pay

## 2013-07-21 DIAGNOSIS — C8589 Other specified types of non-Hodgkin lymphoma, extranodal and solid organ sites: Secondary | ICD-10-CM

## 2013-07-21 DIAGNOSIS — C8339 Primary central nervous system lymphoma: Secondary | ICD-10-CM

## 2013-07-22 ENCOUNTER — Telehealth (HOSPITAL_COMMUNITY): Payer: Self-pay | Admitting: *Deleted

## 2013-07-22 DIAGNOSIS — I2789 Other specified pulmonary heart diseases: Secondary | ICD-10-CM

## 2013-07-22 NOTE — Telephone Encounter (Signed)
Message copied by Scarlette Calico on Wed Jul 22, 2013 10:06 AM ------      Message from: Glori Bickers R      Created: Thu Jul 16, 2013 11:00 PM       Pepin. Did she get LFTs? ------

## 2013-07-24 ENCOUNTER — Encounter: Payer: Self-pay | Admitting: Emergency Medicine

## 2013-07-24 ENCOUNTER — Other Ambulatory Visit: Payer: BC Managed Care – PPO

## 2013-07-24 ENCOUNTER — Ambulatory Visit (INDEPENDENT_AMBULATORY_CARE_PROVIDER_SITE_OTHER): Payer: BC Managed Care – PPO | Admitting: Emergency Medicine

## 2013-07-24 VITALS — BP 100/60 | HR 68 | Ht 68.0 in | Wt 128.4 lb

## 2013-07-24 DIAGNOSIS — L940 Localized scleroderma [morphea]: Secondary | ICD-10-CM

## 2013-07-24 DIAGNOSIS — R69 Illness, unspecified: Secondary | ICD-10-CM

## 2013-07-24 DIAGNOSIS — I2789 Other specified pulmonary heart diseases: Secondary | ICD-10-CM

## 2013-07-24 DIAGNOSIS — I2729 Other secondary pulmonary hypertension: Secondary | ICD-10-CM

## 2013-07-24 LAB — HEPATIC FUNCTION PANEL
ALT: 22 U/L (ref 0–35)
AST: 26 U/L (ref 0–37)
Albumin: 3.8 g/dL (ref 3.5–5.2)
Alkaline Phosphatase: 70 U/L (ref 39–117)
Bilirubin, Direct: 0.1 mg/dL (ref 0.0–0.3)
Total Bilirubin: 0.7 mg/dL (ref 0.3–1.2)
Total Protein: 6.9 g/dL (ref 6.0–8.3)

## 2013-07-24 NOTE — Assessment & Plan Note (Signed)
She is very interested in getting off IV therapy - I have told her that this may not be the right decision at least based on clinical data.  She may decide to stop it in the future no matter what is recommended. For now, I have advised that we get her on a baseline dose of Pred, then repeat her R heart cath so she can discuss further w Dr Gilles Chiquito.

## 2013-07-24 NOTE — Assessment & Plan Note (Signed)
Pulm process (? NSIP) appears to have been steroid responsive. Clinically better

## 2013-07-24 NOTE — Patient Instructions (Addendum)
Please continue to decrease your prednisone every 2 weeks > 8m for 2 weeks, then 167mfor 2 weeks, then 52m70mnd stay at that dose.  Continue your PAH meds as you are taking them We will follow in 1 month. At that time depending on how you are feeling we will discuss a repeat R heart catherization. This information will be helpful when deciding whether to continue your veletri. We can forward the information and discuss with Dr ForGilles Chiquito

## 2013-07-24 NOTE — Progress Notes (Signed)
Subjective:    [name], female    DOB: October 17, 1951, 62 y.o.   MRN: [id number] HPI 62 yo woman, never smoker, hx of scleroderma, Raynaud's, non-Hodgkins lymphoma (treated '10), HTN, PAH originally dx in [place] and treated, but meds d/c'd when she improved. Has been seen in by Dr [name], had R heart cath as below in 03/2012. She has been restarted on therapy, seen both in [place] and at [facility] >> Tracleer + Revatio + Veletri (since 2/'14). She has felt worse on IV therapy, progressive as it titrated up.  Dr Gilles Chiquito scaled back her veletri for this reason. She has had progressive dyspnea - was just started prednisone 72m qd. She has benefited significantly, better exertional tolerance and SpO2.    Underwent RHC on 04/18/12 as below:  RA = 9  RV = 75/11/14  PA = 71/25 (44)  PCW = 4  Fick cardiac output/index = 3.0/1.7  Thermo CO/CI = 2.7/1.5  PVR = 23.5 Woods  FA sat = 79% on room air (checked 3 times). Sat with finger probe on air 94%  PA sat = 41%, 43%  MW 04/18/12 880 feet  ROV 06/24/13 -- f/u for scleroderma, Raynaud's, non-Hodgkins lymphoma (treated '10), HTN, PAH on IV therapy. 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 Aspirating).  She states that she has felt much better with good functional capacity, less dyspnea, no CP since she started prednisone. No overt aspiration sx although she is high risk. [facility]   ROV 07/24/13 -- scleroderma, Raynaud's, non-Hodgkins lymphoma (treated '10), HTN, PAH on IV therapy. Last time we initiated a pred taper, currently on 241m She has noticed a bit of lightheadedness. She saw Dr FoGilles Chiquitoarly July - her O2 was decreased to 4L/min w exertion. Her former maintenance pred dose was 81m47md.    Review of Systems  Constitutional: Negative for fever, appetite change and unexpected weight change.  HENT: Negative for ear pain,  nosebleeds, congestion, sore throat, rhinorrhea, sneezing, trouble swallowing, dental problem, postnasal drip and sinus pressure.   Eyes: Negative for redness and itching.  Respiratory: Positive for shortness of breath. Negative for cough, chest tightness and wheezing.   Cardiovascular: Negative for palpitations and leg swelling.  Gastrointestinal: Negative for nausea and vomiting.  Genitourinary: Negative for dysuria.  Musculoskeletal: Negative for joint swelling.  Skin: Negative for rash.  Neurological: Negative for headaches.  Hematological: Does not bruise/bleed easily.  Psychiatric/Behavioral: Negative for dysphoric mood. The patient is not nervous/anxious.        Objective:   Physical Exam Filed Vitals:   07/24/13 0923  BP: 100/60  Pulse: 68  Height: _0  (1.727 m)  Weight: 128 lb 6.4 oz (58.242 kg)  SpO2: 98%   Gen: Pleasant, thin, in no distress,  normal affect on high flow O2  ENT: No lesions,  mouth clear,  oropharynx clear, no postnasal drip  Neck: No JVD, no TMG, no carotid bruits  Lungs: No use of accessory muscles, few bibasilar insp crackles  Cardiovascular: RRR, heart sounds normal, no murmur or gallops, no peripheral edema  Musculoskeletal: No deformities, no cyanosis or clubbing  Neuro: alert, non focal  Skin: significant changes from scleroderma on arms, hands, fingers      Assessment & Plan:  Scleroderma, circumscribed or localized Pulm process (? NSIP) appears to have been steroid responsive. Clinically better  Pulmonary hypertension associated with systemic disorder She is very interested in getting off IV therapy - I have told her that this may not be the right decision at least based on clinical data.  She may decide to stop it in the future no matter what is recommended. For now, I have advised that we get her on a baseline dose of Pred, then repeat her R heart cath so she can discuss further w Dr Gilles Chiquito.

## 2013-08-06 ENCOUNTER — Encounter (HOSPITAL_BASED_OUTPATIENT_CLINIC_OR_DEPARTMENT_OTHER): Payer: BC Managed Care – PPO | Attending: Internal Medicine

## 2013-08-06 DIAGNOSIS — I83009 Varicose veins of unspecified lower extremity with ulcer of unspecified site: Secondary | ICD-10-CM | POA: Insufficient documentation

## 2013-08-27 ENCOUNTER — Ambulatory Visit (INDEPENDENT_AMBULATORY_CARE_PROVIDER_SITE_OTHER): Payer: BC Managed Care – PPO | Admitting: Emergency Medicine

## 2013-08-27 ENCOUNTER — Other Ambulatory Visit: Payer: BC Managed Care – PPO

## 2013-08-27 ENCOUNTER — Encounter: Payer: Self-pay | Admitting: Emergency Medicine

## 2013-08-27 VITALS — BP 130/80 | HR 80 | Ht 67.5 in | Wt 130.0 lb

## 2013-08-27 DIAGNOSIS — R69 Illness, unspecified: Secondary | ICD-10-CM

## 2013-08-27 DIAGNOSIS — I2789 Other specified pulmonary heart diseases: Secondary | ICD-10-CM

## 2013-08-27 DIAGNOSIS — L940 Localized scleroderma [morphea]: Secondary | ICD-10-CM

## 2013-08-27 DIAGNOSIS — I2729 Other secondary pulmonary hypertension: Secondary | ICD-10-CM

## 2013-08-27 MED ORDER — PREDNISONE 5 MG PO TABS: 5.0000 mg | ORAL_TABLET | Freq: Every day | ORAL | Status: DC

## 2013-08-27 NOTE — Assessment & Plan Note (Signed)
Plan to repeat R heart cath once we confirm that she is stable on 34m pred. Will forward this info to Dr FGilles Chiquito We discussed today that I am skeptical that we will be able to come off flolan.

## 2013-08-27 NOTE — Assessment & Plan Note (Signed)
Decrease pred to 45m today. Based on her past experience, this is probably as low as we should go. Suspect she will need long term. Can consider addition of cellcept in the future.

## 2013-08-27 NOTE — Addendum Note (Signed)
Addended by: Carlos American A on: 08/27/2013 10:29 AM   Modules accepted: Orders

## 2013-08-27 NOTE — Patient Instructions (Addendum)
We will decrease your prednisone to 74m daily Please continue your other medications as you are taking them We will discuss repeating your R heart cath to send this information to Dr FGilles Chiquitoat your next visit Follow with Dr BLamonte Sakaiin 6 -8 weeks.

## 2013-08-27 NOTE — Progress Notes (Signed)
Subjective:    [name], female    DOB: 17-Feb-1951, 62 y.o.   MRN: [id number] HPI 62 yo woman, never smoker, hx of scleroderma, Raynaud's, non-Hodgkins lymphoma (treated '10), HTN, PAH originally dx in [place] and treated, but meds d/c'd when she improved. Has been seen in by Dr [name], had R heart cath as below in 03/2012. She has been restarted on therapy, seen both in [place] and at [facility] >> Tracleer + Revatio + Veletri (since 2/'14). She has felt worse on IV therapy, progressive as it titrated up.  Dr Gilles Chiquito scaled back her veletri for this reason. She has had progressive dyspnea - was just started prednisone 59m qd. She has benefited significantly, better exertional tolerance and SpO2.    Underwent RHC on 04/18/12 as below:  RA = 9  RV = 75/11/14  PA = 71/25 (44)  PCW = 4  Fick cardiac output/index = 3.0/1.7  Thermo CO/CI = 2.7/1.5  PVR = 23.5 Woods  FA sat = 79% on room air (checked 3 times). Sat with finger probe on air 94%  PA sat = 41%, 43%  MW 04/18/12 880 feet  ROV 06/24/13 -- f/u for scleroderma, Raynaud's, non-Hodgkins lymphoma (treated '10), HTN, PAH on IV therapy. 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 Aspirating).  She states that she has felt much better with good functional capacity, less dyspnea, no CP since she started prednisone. No overt aspiration sx although she is high risk. [facility]   ROV 07/24/13 -- scleroderma, Raynaud's, non-Hodgkins lymphoma (treated '10), HTN, PAH on IV therapy. Last time we initiated a pred taper, currently on 224m She has noticed a bit of lightheadedness. She saw Dr FoGilles Chiquitoarly July - her O2 was decreased to 4L/min w exertion. Her former maintenance pred dose was 31m74md.   ROV 08/27/13 -- scleroderma, Raynaud's, non-Hodgkins lymphoma (treated '10), HTN, PAH on IV therapy. We have been tapering pred, currently at 66m40md. She has been interested in getting off Flolan. We have planned to repeat R heart cath when we get to stable pred dosing. Breathing is stable. Continues to have occasional Reynaud's, not really increased freq.    Review of Systems  Constitutional: Negative for fever, appetite change and unexpected weight change.  HENT: Negative for ear pain, nosebleeds, congestion, sore throat, rhinorrhea, sneezing, trouble swallowing, dental problem, postnasal drip and sinus pressure.   Eyes: Negative for redness and itching.  Respiratory: Positive for shortness of breath. Negative for cough, chest tightness and wheezing.   Cardiovascular: Negative for palpitations and leg swelling.  Gastrointestinal: Negative for nausea and vomiting.  Genitourinary: Negative for dysuria.  Musculoskeletal: Negative for joint swelling.  Skin: Negative for rash.  Neurological: Negative for headaches.  Hematological: Does not bruise/bleed easily.  Psychiatric/Behavioral: Negative for dysphoric mood. The patient is not nervous/anxious.        Objective:   Physical Exam Filed Vitals:   08/27/13 0954  BP: 130/80  Pulse: 80  Height: 5' 7.5" (1.715 m)  Weight: 130 lb (58.968 kg)  SpO2: 92%   Gen: Pleasant, thin, in no distress,  normal affect on high flow O2  ENT: No lesions,  mouth clear,  oropharynx clear, no postnasal drip  Neck: No JVD, no TMG, no carotid bruits  Lungs: No use of accessory muscles, few bibasilar insp crackles  Cardiovascular: RRR, heart sounds  normal, no murmur or gallops, no peripheral edema  Musculoskeletal: No deformities, no cyanosis or clubbing  Neuro: alert, non focal  Skin: significant changes from scleroderma on arms, hands, fingers      Assessment & Plan:  Scleroderma, circumscribed or localized Decrease pred to 32m today. Based on her past experience, this is probably as low as we should go. Suspect she will need long term. Can consider addition of cellcept in the future.    Pulmonary hypertension associated with systemic disorder Plan to repeat R heart cath once we confirm that she is stable on 552mpred. Will forward this info to Dr FoGilles ChiquitoWe discussed today that I am skeptical that we will be able to come off flolan.

## 2013-08-28 ENCOUNTER — Ambulatory Visit (INDEPENDENT_AMBULATORY_CARE_PROVIDER_SITE_OTHER): Payer: BC Managed Care – PPO | Admitting: Internal Medicine

## 2013-08-28 ENCOUNTER — Encounter: Payer: Self-pay | Admitting: Internal Medicine

## 2013-08-28 VITALS — BP 134/56 | HR 84 | Temp 98.8°F | Resp 18 | Ht 67.5 in | Wt 128.0 lb

## 2013-08-28 DIAGNOSIS — I10 Essential (primary) hypertension: Secondary | ICD-10-CM

## 2013-08-28 DIAGNOSIS — M831 Senile osteomalacia: Secondary | ICD-10-CM

## 2013-08-28 DIAGNOSIS — K219 Gastro-esophageal reflux disease without esophagitis: Secondary | ICD-10-CM

## 2013-08-28 DIAGNOSIS — I272 Pulmonary hypertension, unspecified: Secondary | ICD-10-CM

## 2013-08-28 DIAGNOSIS — T887XXA Unspecified adverse effect of drug or medicament, initial encounter: Secondary | ICD-10-CM

## 2013-08-28 DIAGNOSIS — M839 Adult osteomalacia, unspecified: Secondary | ICD-10-CM

## 2013-08-28 DIAGNOSIS — I2789 Other specified pulmonary heart diseases: Secondary | ICD-10-CM

## 2013-08-28 LAB — CBC WITH DIFFERENTIAL/PLATELET
Basophils Absolute: 0 10*3/uL (ref 0.0–0.1)
Basophils Relative: 0.6 % (ref 0.0–3.0)
Eosinophils Absolute: 0 10*3/uL (ref 0.0–0.7)
Eosinophils Relative: 0.7 % (ref 0.0–5.0)
HCT: 30.9 % — ABNORMAL LOW (ref 36.0–46.0)
Hemoglobin: 10 g/dL — ABNORMAL LOW (ref 12.0–15.0)
Lymphocytes Relative: 11.4 % — ABNORMAL LOW (ref 12.0–46.0)
Lymphs Abs: 0.7 10*3/uL (ref 0.7–4.0)
MCHC: 32.5 g/dL (ref 30.0–36.0)
MCV: 82.7 fl (ref 78.0–100.0)
Monocytes Absolute: 0.6 10*3/uL (ref 0.1–1.0)
Monocytes Relative: 8.7 % (ref 3.0–12.0)
Neutro Abs: 5.1 10*3/uL (ref 1.4–7.7)
Neutrophils Relative %: 78.6 % — ABNORMAL HIGH (ref 43.0–77.0)
Platelets: 242 10*3/uL (ref 150.0–400.0)
RBC: 3.74 Mil/uL — ABNORMAL LOW (ref 3.87–5.11)
RDW: 19.8 % — ABNORMAL HIGH (ref 11.5–14.6)
WBC: 6.4 10*3/uL (ref 4.5–10.5)

## 2013-08-28 LAB — HEPATIC FUNCTION PANEL
ALT: 13 U/L (ref 0–35)
ALT: 14 U/L (ref 0–35)
AST: 23 U/L (ref 0–37)
AST: 22 U/L (ref 0–37)
Albumin: 3.9 g/dL (ref 3.5–5.2)
Albumin: 3.8 g/dL (ref 3.5–5.2)
Alkaline Phosphatase: 65 U/L (ref 39–117)
Alkaline Phosphatase: 68 U/L (ref 39–117)
Bilirubin, Direct: 0.1 mg/dL (ref 0.0–0.3)
Bilirubin, Direct: 0 mg/dL (ref 0.0–0.3)
Total Bilirubin: 0.5 mg/dL (ref 0.3–1.2)
Total Bilirubin: 0.7 mg/dL (ref 0.3–1.2)
Total Protein: 6.8 g/dL (ref 6.0–8.3)
Total Protein: 7.1 g/dL (ref 6.0–8.3)

## 2013-08-28 MED ORDER — PANTOPRAZOLE SODIUM 40 MG PO TBEC: 40.0000 mg | DELAYED_RELEASE_TABLET | Freq: Every day | ORAL | Status: DC

## 2013-08-28 NOTE — Patient Instructions (Signed)
The patient is instructed to continue all medications as prescribed. Schedule followup with check out clerk upon leaving the clinic  

## 2013-08-28 NOTE — Progress Notes (Signed)
  Subjective:    [name], female    DOB: [birth year], 62 y.o.   MRN: [id number]  HPI reviewed consultation note from pulmonary and heart cath discussed to measure right heart pressures Oxygen need has decreased Stable HTN GERD medications costs of concern   Review of Systems  Constitutional: Positive for activity change, fatigue and unexpected weight change.  HENT: Positive for neck stiffness.   Respiratory: Positive for shortness of breath. Negative for cough.   Gastrointestinal: Negative.   Endocrine: Negative.   Genitourinary: Negative.   Neurological: Negative.        Objective:   Physical Exam  Nursing note and vitals reviewed. Constitutional: She is oriented to person, place, and time. She appears well-developed and well-nourished. No distress.  HENT:  Head: Normocephalic and atraumatic.  Eyes: Conjunctivae and EOM are normal. Pupils are equal, round, and reactive to light.  Neck: Normal range of motion. Neck supple. No JVD present. No tracheal deviation present. No thyromegaly present.  Cardiovascular: Normal rate and regular rhythm.   Murmur heard. Pulmonary/Chest: She has no wheezes. She exhibits no tenderness.  On oxygen  Abdominal: Soft. Bowel sounds are normal.  Musculoskeletal: She exhibits edema and tenderness.  Lymphadenopathy:    She has no cervical adenopathy.  Neurological: She is alert and oriented to person, place, and time. She has normal reflexes. No cranial nerve deficit.  Skin: Skin is dry. Rash noted. She is not diaphoretic. There is erythema.  Psychiatric: She has a normal mood and affect. Her behavior is normal.          Assessment & Plan:  Stable DOE  From pulmonary HTN Foot ulcer treated at wound center in context of PAD Stable essential HTN GERD stratigy Underlying connective tissue dz

## 2013-09-01 ENCOUNTER — Ambulatory Visit (INDEPENDENT_AMBULATORY_CARE_PROVIDER_SITE_OTHER)
Admission: RE | Admit: 2013-09-01 | Discharge: 2013-09-01 | Disposition: A | Discharge status: Home / Self Care | Payer: BC Managed Care – PPO | Source: Ambulatory Visit | Attending: Internal Medicine | Admitting: Internal Medicine

## 2013-09-01 DIAGNOSIS — M839 Adult osteomalacia, unspecified: Secondary | ICD-10-CM

## 2013-09-01 DIAGNOSIS — M831 Senile osteomalacia: Secondary | ICD-10-CM

## 2013-09-03 ENCOUNTER — Encounter (HOSPITAL_BASED_OUTPATIENT_CLINIC_OR_DEPARTMENT_OTHER): Payer: BC Managed Care – PPO | Attending: Internal Medicine

## 2013-09-03 DIAGNOSIS — I83009 Varicose veins of unspecified lower extremity with ulcer of unspecified site: Secondary | ICD-10-CM | POA: Insufficient documentation

## 2013-09-15 ENCOUNTER — Ambulatory Visit (INDEPENDENT_AMBULATORY_CARE_PROVIDER_SITE_OTHER): Payer: BC Managed Care – PPO

## 2013-09-15 DIAGNOSIS — Z23 Encounter for immunization: Secondary | ICD-10-CM

## 2013-10-01 ENCOUNTER — Other Ambulatory Visit: Payer: BC Managed Care – PPO

## 2013-10-01 ENCOUNTER — Encounter (HOSPITAL_BASED_OUTPATIENT_CLINIC_OR_DEPARTMENT_OTHER): Payer: BC Managed Care – PPO | Attending: Internal Medicine

## 2013-10-01 DIAGNOSIS — I2789 Other specified pulmonary heart diseases: Secondary | ICD-10-CM

## 2013-10-01 DIAGNOSIS — I83219 Varicose veins of right lower extremity with both ulcer of unspecified site and inflammation: Secondary | ICD-10-CM | POA: Insufficient documentation

## 2013-10-01 DIAGNOSIS — L97919 Non-pressure chronic ulcer of unspecified part of right lower leg with unspecified severity: Secondary | ICD-10-CM | POA: Insufficient documentation

## 2013-10-01 LAB — HEPATIC FUNCTION PANEL
ALT: 16 U/L (ref 0–35)
AST: 27 U/L (ref 0–37)
Albumin: 4.2 g/dL (ref 3.5–5.2)
Alkaline Phosphatase: 74 U/L (ref 39–117)
Bilirubin, Direct: 0 mg/dL (ref 0.0–0.3)
Total Bilirubin: 0.5 mg/dL (ref 0.3–1.2)
Total Protein: 7.6 g/dL (ref 6.0–8.3)

## 2013-10-08 ENCOUNTER — Ambulatory Visit (INDEPENDENT_AMBULATORY_CARE_PROVIDER_SITE_OTHER): Payer: BC Managed Care – PPO | Admitting: Emergency Medicine

## 2013-10-08 ENCOUNTER — Encounter: Payer: Self-pay | Admitting: Emergency Medicine

## 2013-10-08 VITALS — BP 100/48 | Temp 98.1°F | Ht 68.0 in | Wt 128.0 lb

## 2013-10-08 DIAGNOSIS — R69 Illness, unspecified: Secondary | ICD-10-CM

## 2013-10-08 DIAGNOSIS — I2729 Other secondary pulmonary hypertension: Secondary | ICD-10-CM

## 2013-10-08 DIAGNOSIS — L940 Localized scleroderma [morphea]: Secondary | ICD-10-CM

## 2013-10-08 DIAGNOSIS — I2789 Other specified pulmonary heart diseases: Secondary | ICD-10-CM

## 2013-10-08 NOTE — Progress Notes (Signed)
  Subjective:    [name], female    DOB: 1951/10/12, 62 y.o.   MRN: [id number]  HPI    Review of Systems  Constitutional: Negative for fever and unexpected weight change.  HENT: Negative for congestion, dental problem, ear pain, nosebleeds, postnasal drip, rhinorrhea, sinus pressure, sneezing, sore throat and trouble swallowing.   Eyes: Negative for redness and itching.  Respiratory: Negative for cough, chest tightness, shortness of breath and wheezing.   Cardiovascular: Negative for palpitations and leg swelling.  Gastrointestinal: Negative for nausea and vomiting.  Genitourinary: Negative for dysuria.  Musculoskeletal: Negative for joint swelling.  Skin: Negative for rash.  Neurological: Negative for headaches.  Hematological: Does not bruise/bleed easily.  Psychiatric/Behavioral: Negative for dysphoric mood. The patient is not nervous/anxious.        Objective:   Physical Exam        Assessment & Plan:

## 2013-10-08 NOTE — Patient Instructions (Signed)
Please continue your oxygen as you have been using it Continue your prednisone at 39m daily Dr BLamonte Sakaiwill arrange a R heart catherization with Dr BHaroldine Laws We will contact you with the details.  Follow with Dr BLamonte Sakaiin 3 months or sooner if you have any problems.

## 2013-10-08 NOTE — Assessment & Plan Note (Signed)
As before, she would like to come off veletri. I'm not sure this is realistic. Before making such a move, will need to get R heart data for Dr Gilles Chiquito to interpret. I will discuss with dr bensimhon

## 2013-10-08 NOTE — Progress Notes (Signed)
Subjective:    [name], female    DOB: 01/30/1951, 62 y.o.   MRN: [id number] HPI 62 yo woman, never smoker, hx of scleroderma, Raynaud's, non-Hodgkins lymphoma (treated '10), HTN, PAH originally dx in [place] and treated, but meds d/c'd when she improved. Has been seen in by Dr [name], had R heart cath as below in 03/2012. She has been restarted on therapy, seen both in [place] and at [facility] >> Tracleer + Revatio + Veletri (since 2/'14). She has felt worse on IV therapy, progressive as it titrated up.  Dr Gilles Chiquito scaled back her veletri for this reason. She has had progressive dyspnea - was just started prednisone 637m qd. She has benefited significantly, better exertional tolerance and SpO2.    Underwent RHC on 04/18/12 as below:  RA = 9  RV = 75/11/14  PA = 71/25 (44)  PCW = 4  Fick cardiac output/index = 3.0/1.7  Thermo CO/CI = 2.7/1.5  PVR = 23.5 Woods  FA sat = 79% on room air (checked 3 times). Sat with finger probe on air 94%  PA sat = 41%, 43%  MW 04/18/12 880 feet  ROV 06/24/13 -- f/u for scleroderma, Raynaud's, non-Hodgkins lymphoma (treated '10), HTN, PAH on IV therapy. 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 Aspirating).  She states that she has felt much better with good functional capacity, less dyspnea, no CP since she started prednisone. No overt aspiration sx although she is high risk. [facility]   ROV 07/24/13 -- scleroderma, Raynaud's, non-Hodgkins lymphoma (treated '10), HTN, PAH on IV therapy. Last time we initiated a pred taper, currently on 239m She has noticed a bit of lightheadedness. She saw Dr FoGilles Chiquitoarly July - her O2 was decreased to 4L/min w exertion. Her former maintenance pred dose was 37m152md.   ROV 08/27/13 -- scleroderma, Raynaud's, non-Hodgkins lymphoma (treated '10), HTN, PAH on IV therapy. We have been tapering pred, currently at 52m78md. She has been interested in getting off Veletri. We have planned to repeat R heart cath when we get to stable pred dosing. Breathing is stable. Continues to have occasional Reynaud's, not really increased freq.   ROV 10/08/13 -- scleroderma, Raynaud's, non-Hodgkins lymphoma (treated '10), HTN, PAH on IV therapy. We have been tapering pred, last time decreased to 37mg.52me appears to have tolerated this. No new issues with breathing, CP. Edema is stable. No syncope or pre-syncope.    Review of Systems  Constitutional: Negative for fever, appetite change and unexpected weight change.  HENT: Negative for congestion, dental problem, ear pain, nosebleeds, postnasal drip, rhinorrhea, sinus pressure, sneezing, sore throat and trouble swallowing.   Eyes: Negative for redness and itching.  Respiratory: Positive for shortness of breath. Negative for cough, chest tightness and wheezing.   Cardiovascular: Negative for palpitations and leg swelling.  Gastrointestinal: Negative for nausea and vomiting.  Genitourinary: Negative for dysuria.  Musculoskeletal: Negative for joint swelling.  Skin: Negative for rash.  Neurological: Negative for headaches.  Hematological: Does not bruise/bleed easily.  Psychiatric/Behavioral: Negative for dysphoric mood. The patient is not nervous/anxious.        Objective:   Physical Exam Filed Vitals:   10/08/13 1458  BP: 100/48  Temp: 98.1 F (36.7 C)  TempSrc: Oral  Height: _0  (1.727 m)  Weight: 128 lb (58.06 kg)   Gen: Pleasant, thin,  in no distress,  normal affect on high flow O2  ENT: No lesions,  mouth clear,  oropharynx clear, no postnasal drip  Neck: No JVD, no TMG, no carotid bruits  Lungs: No use of accessory muscles, few bibasilar insp crackles  Cardiovascular: RRR, heart sounds normal, no murmur or gallops, no peripheral edema  Musculoskeletal: No deformities, no cyanosis or clubbing  Neuro: alert, non focal  Skin: significant changes from  scleroderma on arms, hands, fingers      Assessment & Plan:  Scleroderma, circumscribed or localized She has tolerated decrease in Pred to 3m without new issues. I suspect we will try to transition to Cellcept in the future.    Pulmonary hypertension associated with systemic disorder As before, she would like to come off veletri. I'm not sure this is realistic. Before making such a move, will need to get R heart data for Dr FGilles Chiquitoto interpret. I will discuss with dr bensimhon

## 2013-10-08 NOTE — Assessment & Plan Note (Signed)
She has tolerated decrease in Pred to 72m without new issues. I suspect we will try to transition to Cellcept in the future.

## 2013-10-20 ENCOUNTER — Ambulatory Visit (HOSPITAL_COMMUNITY)
Admission: RE | Admit: 2013-10-20 | Discharge: 2013-10-20 | Disposition: A | Discharge status: Home / Self Care | Payer: BC Managed Care – PPO | Source: Ambulatory Visit | Attending: Internal Medicine | Admitting: Internal Medicine

## 2013-10-20 ENCOUNTER — Encounter (HOSPITAL_COMMUNITY): Payer: Self-pay

## 2013-10-20 VITALS — BP 106/62 | Wt 126.1 lb

## 2013-10-20 DIAGNOSIS — I2789 Other specified pulmonary heart diseases: Secondary | ICD-10-CM | POA: Insufficient documentation

## 2013-10-20 DIAGNOSIS — R69 Illness, unspecified: Secondary | ICD-10-CM

## 2013-10-20 DIAGNOSIS — I2729 Other secondary pulmonary hypertension: Secondary | ICD-10-CM

## 2013-10-20 NOTE — Patient Instructions (Signed)
Your physician has requested that you have an echocardiogram. Echocardiography is a painless test that uses sound waves to create images of your heart. It provides your doctor with information about the size and shape of your heart and how well your heart's chambers and valves are working. This procedure takes approximately one hour. There are no restrictions for this procedure.  WED October 21, 2013 @ 10:00 aM  Your physician recommends that you schedule a follow-up appointment in: 1 MONTH

## 2013-10-20 NOTE — Progress Notes (Signed)
[name], female   DOB: 10-Jun-1951, 62 y.o.   MRN: [id number] Primary Care: Dr. Benay Pillow Primary Cardiologist: Dr. [name] Pulmonologist: Dr. Gwenette Greet  HPI: [name] is a 62 y.o. female with history of scleroderma dx'd in 1968, [place] of the brain treated with chem which is in remission since 2010, pulmonary hypertension diagnosed in 2007.  With regard to her scleroderma. Was following at [facility] but rheumatologist left. Prednisone was stopped several years ago and has not been anything for her scleroderma since that time.  Diagnosed with PAH in 2007 in [place]. She did have a right heart catheterization by her history for verification. The patient has been treated with cellcept, tracleer and revatio for approximately one to 2 years, and then tells me the medications were stopped in [place] because the echo "looked good".  I do not have her old records available for review. Apparently she was followed very closely with serial echocardiograms that remained fairly normal, and therefore she was taken off medications. Echo in 2011 as a f/u for her chemotherapy. LV EF 65-70%. RV severely enlarged/HK severe TR. pRVSP 115-120. She saw Dr. Gwenette Greet and referred here for [facility]. No evidence of pulmonary fibrosis on CXR.   RHC 04/18/12  RA = 9  RV = 75/11/14  PA = 71/25 (44)  PCW = 4  Fick cardiac output/index = 3.0/1.7  Thermo CO/CI = 2.7/1.5  PVR = 13.3 Woods  FA sat = 79% on room air (checked 3 times). Sat with finger probe on air 94%  PA sat = 41%, 43%   6 MW 04/18/12 880 feet 6 MW 11/10/12 650 feet. Limited by legs. Sats down to 85% on 5L (sats at rest 95%)   Echo 10/13 LV EF 65-70%. RV severely enlarged/HK severe TR. pRVSP 23mHg  Repeat RHC 11/14/12  RA = 14  RV = 84/8/20  PA = 83/29 (50)  PCW = 10  Fick cardiac output/index = 4.5/2.5  Thermo CO/CI = 3.8/2.2  PVR = 8.9 Woods (Fick)  FA sat = 86% on 3L  PA sat = 55%, 57  [facility]and  started on IV epoprostenol (Veletri) in 01/2013. Also on bosentan 10 and Revatio 40 tid. She says she is doing OK but wants to come off Veletri. [facility] then as the dose was titrated up she felt horrible. Couldn't breathe or do anything. + chest pain. Saw Dr. FGilles Chiquitoagain in May. Started on prednisone at 40 mg x 4 weeks with the feeling that she might be having a scleroderma flare. Dose was also cut back but no real change. Has been at that dose since. With prednisone she felt great.   Remains on 539mprednisone daily. LE wounds have almost healed completely. Has not had f/u echo since starting Veletri. [facility]. Having mild jaw claudication. Also struggling with dry skin and lose stools. Would like to stop Veletri. Not seeing a rheumatologist currently.   [place], prednisone, bosentan and sildenafil.     Review of Systems: All pertinent positives and negatives as in HPI, otherwise negative.   Past Medical History  Diagnosis Date  . History of chicken pox   . Hypertension   . Sclerodermia generalized   . Non Hodgkin's lymphoma     Treated with chemo 2010, felt to  be cured.  . Melanoma 1999  . Peripheral arterial occlusive disease   . Neuropathic foot ulcer   . DOE (dyspnea on exertion)   . PAH (pulmonary artery hypertension)   . Epistaxis     Current Outpatient Prescriptions  Medication Sig Dispense Refill  . acidophilus (RISAQUAD) CAPS Take 1 capsule by mouth every morning.       Marland Kitchen amitriptyline (ELAVIL) 10 MG tablet take 1 tablet by mouth at bedtime  30 tablet  11  . aspirin 81 MG tablet Take 81 mg by mouth every evening.       Marland Kitchen azelastine (ASTELIN) 137 MCG/SPRAY nasal spray Place 2 sprays into the nose 2 (two) times daily as needed for rhinitis. Use in each nostril as directed      . Biotin 10 MG TABS Take 1 tablet by mouth every  morning.      . bosentan (TRACLEER) 125 MG tablet Take 125 mg by mouth 2 (two) times daily.      . cholecalciferol (VITAMIN D) 1000 UNITS tablet Take 2,000 Units by mouth every morning.      . citalopram (CELEXA) 20 MG tablet Take 20 mg by mouth every evening.       . clopidogrel (PLAVIX) 75 MG tablet Take 75 mg by mouth every morning.      Marland Kitchen Epoprostenol Sodium (VELETRI IV) Inject 3 microL into the vein continuous. Diluted to 5m/24 hours rate in pump      . esomeprazole (NEXIUM) 40 MG capsule Take 40 mg by mouth daily with lunch.      . fish oil-omega-3 fatty acids 1000 MG capsule Take 1-2 g by mouth 2 (two) times daily. Takes 1 capsule in the morning and 2 capsules at night      . furosemide (LASIX) 80 MG tablet Take 1 tablet (80 mg total) by mouth 2 (two) times daily.  60 tablet  6  . gabapentin (NEURONTIN) 300 MG capsule Take 300 mg by mouth. Take 1 at noon and 1 at bedtime      . mometasone (NASONEX) 50 MCG/ACT nasal spray Place 2 sprays into the nose daily as needed. 1-2 sprays once daily      . multivitamin (THERAGRAN) per tablet Take 1 tablet by mouth 2 (two) times daily.       . pantoprazole (PROTONIX) 40 MG tablet Take 1 tablet (40 mg total) by mouth daily.  30 tablet  3  . predniSONE (DELTASONE) 5 MG tablet Take 1 tablet (5 mg total) by mouth daily.  30 tablet  5  . sildenafil (REVATIO) 20 MG tablet Take 40 mg by mouth 3 (three) times daily.      .Marland Kitchenspironolactone (ALDACTONE) 25 MG tablet Take 25 mg by mouth every morning.      . vitamin B-12 (CYANOCOBALAMIN) 500 MCG tablet Take 500 mcg by mouth every morning.        No current facility-administered medications for this encounter.    Allergies  Allergen Reactions  . Sulfa Antibiotics     Severe rash with significant peeling of face. No airway involvement per patient  . Levaquin [Levofloxacin In D5w] Other (See Comments)    Other reaction(s): Other (See Comments) FLU LIKE SYMPTOM  . Penicillins Rash     Filed Vitals:    10/20/13 1407  BP: 106/62  Weight: 126 lb 1.6 oz (57.199 kg)    PHYSICAL EXAM: General: No acute distress No respiratory difficulty HEENT: normal Neck: supple. JVP 7-8. Carotids 2+ bilat;  no bruits. No lymphadenopathy or thryomegaly appreciated. Cor: PMI nondisplaced. Regular rate & rhythm. Prominent P2. Soft TR murmur. + RV lift Lungs: clear on 5 liters [place] Abdomen: soft, nontender, nondistended. No hepatosplenomegaly. No bruits or masses. Good bowel sounds. 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 + gouty nodules. LEs wrapped due to ulcers.  Minimal edema Neuro: alert & oriented x 3, cranial nerves grossly intact. moves all 4 extremities w/o difficulty. Affect pleasant.  ASSESSMENT & PLAN:  1. PAH - on veletri, macitentan and sildenafil 2. Scleroderma 3. Chronic leg ulcers 4. H/o NHL  Difficult situation. She feels veletri is not helping and actually making her worse. She is adamant that she would like to stop this. Seems to get significant benefit from steroids and thus I worry that her scleroderma remains active. Currently NYHA III on combination therapy and O2. We had long talk about therapeutic options to replace Veletri.  I called and discussed with [facility] and plan will be to get echo tomorrow to assess PA pressures on current therapy. [facility] will contact her to begin to wean Veletri. We will begin Tyvaso registration process. Once Tyvaso started can then wean Veletri. D/w Dr. Lamonte Sakai as well.   Have encouraged her to make appointment with Dr. Amil Amen in Rheumatology to f/u on her scleroderma and assess need for Cellcept.   Daniel Bensimhon,MD 4:08 PM

## 2013-10-21 ENCOUNTER — Ambulatory Visit (HOSPITAL_COMMUNITY)
Admission: RE | Admit: 2013-10-21 | Discharge: 2013-10-21 | Disposition: A | Discharge status: Home / Self Care | Payer: BC Managed Care – PPO | Source: Ambulatory Visit | Attending: Internal Medicine | Admitting: Internal Medicine

## 2013-10-21 DIAGNOSIS — I079 Rheumatic tricuspid valve disease, unspecified: Secondary | ICD-10-CM | POA: Insufficient documentation

## 2013-10-21 DIAGNOSIS — C8589 Other specified types of non-Hodgkin lymphoma, extranodal and solid organ sites: Secondary | ICD-10-CM | POA: Insufficient documentation

## 2013-10-21 DIAGNOSIS — R0989 Other specified symptoms and signs involving the circulatory and respiratory systems: Secondary | ICD-10-CM | POA: Insufficient documentation

## 2013-10-21 DIAGNOSIS — E785 Hyperlipidemia, unspecified: Secondary | ICD-10-CM | POA: Insufficient documentation

## 2013-10-21 DIAGNOSIS — R0609 Other forms of dyspnea: Secondary | ICD-10-CM | POA: Insufficient documentation

## 2013-10-21 DIAGNOSIS — I509 Heart failure, unspecified: Secondary | ICD-10-CM | POA: Insufficient documentation

## 2013-10-21 DIAGNOSIS — I2729 Other secondary pulmonary hypertension: Secondary | ICD-10-CM

## 2013-10-21 DIAGNOSIS — I2789 Other specified pulmonary heart diseases: Secondary | ICD-10-CM | POA: Insufficient documentation

## 2013-10-21 DIAGNOSIS — I369 Nonrheumatic tricuspid valve disorder, unspecified: Secondary | ICD-10-CM

## 2013-10-21 NOTE — Progress Notes (Signed)
*  PRELIMINARY RESULTS* Echocardiogram 2D Echocardiogram has been performed.  Leavy Cella 10/21/2013, 11:02 AM

## 2013-10-29 ENCOUNTER — Other Ambulatory Visit: Payer: BC Managed Care – PPO

## 2013-10-29 DIAGNOSIS — I2789 Other specified pulmonary heart diseases: Secondary | ICD-10-CM

## 2013-10-29 LAB — HEPATIC FUNCTION PANEL
ALT: 15 U/L (ref 0–35)
AST: 26 U/L (ref 0–37)
Albumin: 3.8 g/dL (ref 3.5–5.2)
Alkaline Phosphatase: 108 U/L (ref 39–117)
Bilirubin, Direct: 0.1 mg/dL (ref 0.0–0.3)
Total Bilirubin: 0.7 mg/dL (ref 0.3–1.2)
Total Protein: 7.8 g/dL (ref 6.0–8.3)

## 2013-11-12 ENCOUNTER — Encounter (HOSPITAL_BASED_OUTPATIENT_CLINIC_OR_DEPARTMENT_OTHER): Payer: BC Managed Care – PPO | Attending: Internal Medicine

## 2013-11-12 DIAGNOSIS — L97909 Non-pressure chronic ulcer of unspecified part of unspecified lower leg with unspecified severity: Secondary | ICD-10-CM | POA: Insufficient documentation

## 2013-11-12 DIAGNOSIS — I872 Venous insufficiency (chronic) (peripheral): Secondary | ICD-10-CM | POA: Insufficient documentation

## 2013-11-12 DIAGNOSIS — I87319 Chronic venous hypertension (idiopathic) with ulcer of unspecified lower extremity: Secondary | ICD-10-CM | POA: Insufficient documentation

## 2013-11-23 ENCOUNTER — Encounter (HOSPITAL_COMMUNITY): Payer: Self-pay

## 2013-11-23 ENCOUNTER — Ambulatory Visit (HOSPITAL_COMMUNITY)
Admission: RE | Admit: 2013-11-23 | Discharge: 2013-11-23 | Disposition: A | Discharge status: Home / Self Care | Payer: BC Managed Care – PPO | Source: Ambulatory Visit | Attending: Internal Medicine | Admitting: Internal Medicine

## 2013-11-23 VITALS — BP 120/70 | HR 75 | Wt 125.0 lb

## 2013-11-23 DIAGNOSIS — I2789 Other specified pulmonary heart diseases: Secondary | ICD-10-CM | POA: Insufficient documentation

## 2013-11-23 DIAGNOSIS — R69 Illness, unspecified: Secondary | ICD-10-CM

## 2013-11-23 DIAGNOSIS — L97909 Non-pressure chronic ulcer of unspecified part of unspecified lower leg with unspecified severity: Secondary | ICD-10-CM | POA: Insufficient documentation

## 2013-11-23 DIAGNOSIS — L940 Localized scleroderma [morphea]: Secondary | ICD-10-CM

## 2013-11-23 DIAGNOSIS — C8589 Other specified types of non-Hodgkin lymphoma, extranodal and solid organ sites: Secondary | ICD-10-CM | POA: Insufficient documentation

## 2013-11-23 DIAGNOSIS — M349 Systemic sclerosis, unspecified: Secondary | ICD-10-CM | POA: Insufficient documentation

## 2013-11-23 DIAGNOSIS — I2729 Other secondary pulmonary hypertension: Secondary | ICD-10-CM

## 2013-11-23 NOTE — Progress Notes (Signed)
[name], female   DOB: [birth year], 62 y.o.   MRN: [id number] Primary Care: Dr. Benay Pillow Primary Cardiologist: Dr. [name] Pulmonologist: Dr. Gwenette Greet  HPI: [name] is a 62 y.o. female with history of scleroderma dx'd in 1968, [place] of the brain treated with chem which is in remission since 2010, pulmonary hypertension diagnosed in 2007.  With regard to her scleroderma. Was following at [facility] but rheumatologist left. Prednisone was stopped several years ago and has not been anything for her scleroderma since that time.  Diagnosed with PAH in 2007 in [place]. She did have a right heart catheterization by her history for verification. The patient has been treated with cellcept, tracleer and revatio for approximately one to 2 years, and then tells me the medications were stopped in [place] because the echo "looked good".  I do not have her old records available for review. Apparently she was followed very closely with serial echocardiograms that remained fairly normal, and therefore she was taken off medications. Echo in 2011 as a f/u for her chemotherapy. LV EF 65-70%. RV severely enlarged/HK severe TR. pRVSP 115-120. She saw Dr. Gwenette Greet and referred here for [place]. No evidence of pulmonary fibrosis on CXR.   RHC 04/18/12  RA = 9  RV = 75/11/14  PA = 71/25 (44)  PCW = 4  Fick cardiac output/index = 3.0/1.7  Thermo CO/CI = 2.7/1.5  PVR = 13.3 Woods  FA sat = 79% on room air (checked 3 times). Sat with finger probe on air 94%  PA sat = 41%, 43%   6 MW 04/18/12 880 feet 6 MW 11/10/12 650 feet. Limited by legs. Sats down to 85% on 5L (sats at rest 95%)   Echo 10/13 LV EF 65-70%. RV severely enlarged/HK severe TR. pRVSP 74mHg  Repeat RHC 11/14/12  RA = 14  RV = 84/8/20  PA = 83/29 (50)  PCW = 10  Fick cardiac output/index = 4.5/2.5  Thermo CO/CI = 3.8/2.2  PVR = 8.9 Woods (Fick)  FA sat = 86% on 3L  PA sat = 55%, 57   [facility]and started on IV  epoprostenol (Veletri) in 01/2013. Also on bosentan 10 and Revatio 40 tid.    [place], prednisone, bosentan and sildenafil.   She returns for follow up.  She is in the process of wean Veletri as it didn't seem to provide much benefit symptomatically. The plan will be to try to switch her over to inhaled Tyvaso. Current dose of Veletri is 66 cc/12.5.  Also remains on 526mprednisone daily as it is felt her scleroderma may still be active. LE wounds healing and currently using OASIS every 2 weeks. Referral to Dr BeAmil Amenending. Says she is feeling pretty good NYHA II-III dyspnea. No syncope or CP. Edema well controlled on lasix 80 bid. Weight stable.    Review of Systems: All pertinent positives and negatives as in HPI, otherwise negative.   Past Medical History  Diagnosis Date  . History of chicken pox   . Hypertension   . Sclerodermia generalized   . Non Hodgkin's lymphoma     Treated with chemo 2010, felt to be cured.  . Melanoma 1999  . Peripheral arterial occlusive disease   . Neuropathic foot ulcer   . DOE (dyspnea on exertion)   . PAH (pulmonary artery hypertension)   . Epistaxis     Current Outpatient Prescriptions  Medication  Sig Dispense Refill  . acidophilus (RISAQUAD) CAPS Take 1 capsule by mouth every morning.       Marland Kitchen aspirin 81 MG tablet Take 81 mg by mouth every evening.       Marland Kitchen azelastine (ASTELIN) 137 MCG/SPRAY nasal spray Place 2 sprays into the nose 2 (two) times daily as needed for rhinitis. Use in each nostril as directed      . Biotin 10 MG TABS Take 1 tablet by mouth every morning.      . bosentan (TRACLEER) 125 MG tablet Take 125 mg by mouth 2 (two) times daily.      . cholecalciferol (VITAMIN D) 1000 UNITS tablet Take 2,000 Units by mouth every morning.      . clopidogrel (PLAVIX) 75 MG tablet Take 75 mg by mouth every morning.      Marland Kitchen Epoprostenol Sodium (VELETRI IV) Inject 3 microL into the vein continuous.  Diluted to 2m/24 hours rate in pump      . esomeprazole (NEXIUM) 40 MG capsule Take 40 mg by mouth daily with lunch.      . fish oil-omega-3 fatty acids 1000 MG capsule Take 1-2 g by mouth 2 (two) times daily. Takes 1 capsule in the morning and 2 capsules at night      . furosemide (LASIX) 80 MG tablet Take 1 tablet (80 mg total) by mouth 2 (two) times daily.  60 tablet  6  . gabapentin (NEURONTIN) 300 MG capsule Take 300 mg by mouth. Take 1 at noon and 1 at bedtime      . mometasone (NASONEX) 50 MCG/ACT nasal spray Place 2 sprays into the nose daily as needed. 1-2 sprays once daily      . multivitamin (THERAGRAN) per tablet Take 1 tablet by mouth 2 (two) times daily.       . pantoprazole (PROTONIX) 40 MG tablet Take 1 tablet (40 mg total) by mouth daily.  30 tablet  3  . predniSONE (DELTASONE) 5 MG tablet Take 1 tablet (5 mg total) by mouth daily.  30 tablet  5  . sildenafil (REVATIO) 20 MG tablet Take 40 mg by mouth 3 (three) times daily.      .Marland Kitchenspironolactone (ALDACTONE) 25 MG tablet Take 25 mg by mouth every morning.      . vitamin B-12 (CYANOCOBALAMIN) 500 MCG tablet Take 500 mcg by mouth every morning.        No current facility-administered medications for this encounter.    Allergies  Allergen Reactions  . Sulfa Antibiotics     Severe rash with significant peeling of face. No airway involvement per patient  . Levaquin [Levofloxacin In D5w] Other (See Comments)    Other reaction(s): Other (See Comments) FLU LIKE SYMPTOM  . Penicillins Rash     Filed Vitals:   11/23/13 1504  BP: 120/70  Pulse: 75  Weight: 125 lb (56.7 kg)  SpO2: 98%    PHYSICAL EXAM: General: No acute distress No respiratory difficulty HEENT: normal Neck: supple. JVP 7-8. Carotids 2+ bilat; no bruits. No lymphadenopathy or thryomegaly appreciated. Cor: PMI nondisplaced. Regular rate & rhythm. Prominent P2. Soft TR murmur. + RV lift Lungs: clear on 5 liters [place] Abdomen: soft, nontender, nondistended. No  hepatosplenomegaly. No bruits or masses. Good bowel sounds. 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 + gouty nodules. LEs wrapped due to ulcers.  Minimal edema Neuro: alert & oriented x 3, cranial nerves grossly intact. moves all 4 extremities w/o difficulty. Affect pleasant.  ASSESSMENT & PLAN:  1. PAH - on veletri, macitentan and sildenafil 2. Scleroderma 3. Chronic leg ulcers 4. H/o NHL  Tolerating Veletri wean. Currently NYHA II-III on combination therapy and O2. Once Tyvaso started can then wean Veletri.   Will make referral to Dr. Amil Amen in Rheumatology to f/u on her scleroderma and assess need for Cellcept.   CLEGG,AMY,NP-C  3:09 PM  Patient seen and examined with Darrick Grinder, NP. We discussed all aspects of the encounter. I agree with the assessment and plan as stated above.   Tolerating Veletri wean well. Will plan to transition to Tyvaso when it is received. We will do this in clinic to make sure she does not have a rebound effect. Volume status looks good on current dose of lasix. Need to check electrolytes with next LFT check.   Have filled out referral form for her to see Dr. Amil Amen and re-establish Rheumatologic f/u.   Daniel Bensimhon,MD 3:28 PM

## 2013-11-23 NOTE — Addendum Note (Signed)
Encounter addended by: Scarlette Calico, RN on: 11/23/2013  4:25 PM<BR>     Documentation filed: Orders

## 2013-11-23 NOTE — Addendum Note (Signed)
Encounter addended by: Scarlette Calico, RN on: 11/23/2013  4:24 PM<BR>     Documentation filed: Orders

## 2013-12-03 ENCOUNTER — Other Ambulatory Visit (INDEPENDENT_AMBULATORY_CARE_PROVIDER_SITE_OTHER): Payer: BC Managed Care – PPO

## 2013-12-03 ENCOUNTER — Encounter (HOSPITAL_BASED_OUTPATIENT_CLINIC_OR_DEPARTMENT_OTHER): Payer: BC Managed Care – PPO | Attending: Internal Medicine

## 2013-12-03 DIAGNOSIS — R69 Illness, unspecified: Secondary | ICD-10-CM

## 2013-12-03 DIAGNOSIS — L02419 Cutaneous abscess of limb, unspecified: Secondary | ICD-10-CM | POA: Insufficient documentation

## 2013-12-03 DIAGNOSIS — L97909 Non-pressure chronic ulcer of unspecified part of unspecified lower leg with unspecified severity: Secondary | ICD-10-CM | POA: Insufficient documentation

## 2013-12-03 DIAGNOSIS — L940 Localized scleroderma [morphea]: Secondary | ICD-10-CM

## 2013-12-03 DIAGNOSIS — I87319 Chronic venous hypertension (idiopathic) with ulcer of unspecified lower extremity: Secondary | ICD-10-CM | POA: Insufficient documentation

## 2013-12-03 DIAGNOSIS — I2789 Other specified pulmonary heart diseases: Secondary | ICD-10-CM

## 2013-12-03 DIAGNOSIS — I2729 Other secondary pulmonary hypertension: Secondary | ICD-10-CM

## 2013-12-03 LAB — HEPATIC FUNCTION PANEL
ALT: 22 U/L (ref 0–35)
AST: 31 U/L (ref 0–37)
Albumin: 4 g/dL (ref 3.5–5.2)
Alkaline Phosphatase: 87 U/L (ref 39–117)
Bilirubin, Direct: 0.1 mg/dL (ref 0.0–0.3)
Total Bilirubin: 0.7 mg/dL (ref 0.3–1.2)
Total Protein: 7.4 g/dL (ref 6.0–8.3)

## 2013-12-03 LAB — BASIC METABOLIC PANEL
BUN: 30 mg/dL — ABNORMAL HIGH (ref 6–23)
CO2: 31 mEq/L (ref 19–32)
Calcium: 9.3 mg/dL (ref 8.4–10.5)
Chloride: 95 mEq/L — ABNORMAL LOW (ref 96–112)
Creatinine, Ser: 1.1 mg/dL (ref 0.4–1.2)
GFR: 53.12 mL/min — ABNORMAL LOW (ref >60.00)
Glucose, Bld: 95 mg/dL (ref 70–99)
Potassium: 4 mEq/L (ref 3.5–5.1)
Sodium: 137 mEq/L (ref 135–145)

## 2013-12-10 ENCOUNTER — Other Ambulatory Visit: Payer: Self-pay | Admitting: Internal Medicine

## 2013-12-15 ENCOUNTER — Ambulatory Visit (INDEPENDENT_AMBULATORY_CARE_PROVIDER_SITE_OTHER): Payer: BC Managed Care – PPO | Admitting: Family

## 2013-12-15 ENCOUNTER — Telehealth (HOSPITAL_COMMUNITY): Payer: Self-pay | Admitting: *Deleted

## 2013-12-15 ENCOUNTER — Encounter: Payer: Self-pay | Admitting: Family

## 2013-12-15 VITALS — BP 130/78 | HR 78 | Temp 98.7°F | Wt 117.0 lb

## 2013-12-15 DIAGNOSIS — I2789 Other specified pulmonary heart diseases: Secondary | ICD-10-CM

## 2013-12-15 DIAGNOSIS — J069 Acute upper respiratory infection, unspecified: Secondary | ICD-10-CM

## 2013-12-15 DIAGNOSIS — I272 Pulmonary hypertension, unspecified: Secondary | ICD-10-CM

## 2013-12-15 MED ORDER — PREDNISONE 20 MG PO TABS: 40.0000 mg | ORAL_TABLET | Freq: Every day | ORAL | Status: AC

## 2013-12-15 NOTE — Patient Instructions (Signed)

## 2013-12-15 NOTE — Progress Notes (Signed)
Subjective:    [name], female    DOB: [birth year], 62 y.o.   MRN: [id number]  HPI 62 year old white female, nonsmoker with a history of pulmonary hypertension is in today for complaints of cough, nasal congestion, postnasal drip, itchy throat x4 days. She's been taking Mucinex over-the-counter with no relief. Is on 4 L of oxygen during the day and 2 L at bedtime.   Review of Systems  Constitutional: Negative.   HENT: Positive for congestion, postnasal drip, sneezing and sore throat.   Respiratory: Positive for cough. Negative for shortness of breath and wheezing.   Cardiovascular: Negative.   Gastrointestinal: Negative.   Musculoskeletal: Negative.   Skin: Negative.   Allergic/Immunologic: Negative.   Neurological: Negative.   Psychiatric/Behavioral: Negative.    Past Medical History  Diagnosis Date  . History of chicken pox   . Hypertension   . Sclerodermia generalized   . Non Hodgkin's lymphoma     Treated with chemo 2010, felt to be cured.  . Melanoma 1999  . Peripheral arterial occlusive disease   . Neuropathic foot ulcer   . DOE (dyspnea on exertion)   . PAH (pulmonary artery hypertension)   . Epistaxis     History   Social History  . Marital Status: Married    Spouse Name: N/A    Number of Children: 1  . Years of Education: N/A   Occupational History  . homemaker    Social History Main Topics  . Smoking status: Never Smoker   . Smokeless tobacco: Never Used  . Alcohol Use: Yes     Comment: Occasional  . Drug Use: No  . Sexual Activity: Not Currently   Other Topics Concern  . Not on file   Social History Narrative  . No narrative on file    Past Surgical History  Procedure Laterality Date  . Rt little toe removal  10/1998  . Stent left thigh  3/11  . Tonsillectomy and adenoidectomy    . Small intestine surgery    . Biopsy on cerebellum    . Vein ligation and stripping  05/16/2012    Procedure: VEIN LIGATION AND STRIPPING;   Surgeon: [name], MD;  Location: [facility] OR;  Service: Vascular;  Laterality: Right;  Irrigation and Debridement right lower leg, ligation of saphenous vein.  . Endovenous ablation saphenous vein w/ laser  07-24-2012    right greater saphenous vein by [name], M.D.   . Endovenous ablation saphenous vein w/ laser  10-16-2012    left greater saphenous vein  by Dr. [name]  . Apligraph  12-04-2102,   12-18-2012    bilateral calves   [name]    Family History  Problem Relation Age of Onset  . Lupus Father   . Lymphoma Father   . Hyperlipidemia Mother   . Cancer Daughter     sarcoma  . Colon cancer      Allergies  Allergen Reactions  . Sulfa Antibiotics     Severe rash with significant peeling of face. No airway involvement per patient  . Levaquin [Levofloxacin In D5w] Other (See Comments)    Other reaction(s): Other (See Comments) FLU LIKE SYMPTOM  . Penicillins Rash    Current Outpatient Prescriptions on File Prior to Visit  Medication Sig Dispense Refill  . acidophilus (RISAQUAD) CAPS Take 1 capsule by mouth every morning.       Marland Kitchen aspirin 81 MG tablet Take 81 mg by mouth every  evening.       Marland Kitchen azelastine (ASTELIN) 137 MCG/SPRAY nasal spray Place 2 sprays into the nose 2 (two) times daily as needed for rhinitis. Use in each nostril as directed      . Biotin 10 MG TABS Take 1 tablet by mouth every morning.      . bosentan (TRACLEER) 125 MG tablet Take 125 mg by mouth 2 (two) times daily.      . cholecalciferol (VITAMIN D) 1000 UNITS tablet Take 2,000 Units by mouth every morning.      . clopidogrel (PLAVIX) 75 MG tablet Take 75 mg by mouth every morning.      . clopidogrel (PLAVIX) 75 MG tablet take 1 tablet by mouth once daily  30 tablet  12  . Epoprostenol Sodium (VELETRI IV) Inject 3 microL into the vein continuous. Diluted to 14m/24 hours rate in pump      . esomeprazole (NEXIUM) 40 MG capsule Take 40 mg by mouth daily with lunch.      . fish  oil-omega-3 fatty acids 1000 MG capsule Take 1-2 g by mouth 2 (two) times daily. Takes 1 capsule in the morning and 2 capsules at night      . furosemide (LASIX) 80 MG tablet Take 1 tablet (80 mg total) by mouth 2 (two) times daily.  60 tablet  6  . gabapentin (NEURONTIN) 300 MG capsule Take 300 mg by mouth. Take 1 at noon and 1 at bedtime      . mometasone (NASONEX) 50 MCG/ACT nasal spray Place 2 sprays into the nose daily as needed. 1-2 sprays once daily      . multivitamin (THERAGRAN) per tablet Take 1 tablet by mouth 2 (two) times daily.       . pantoprazole (PROTONIX) 40 MG tablet Take 1 tablet (40 mg total) by mouth daily.  30 tablet  3  . predniSONE (DELTASONE) 5 MG tablet Take 1 tablet (5 mg total) by mouth daily.  30 tablet  5  . sildenafil (REVATIO) 20 MG tablet Take 40 mg by mouth 3 (three) times daily.      .Marland Kitchenspironolactone (ALDACTONE) 25 MG tablet Take 25 mg by mouth every morning.      . vitamin B-12 (CYANOCOBALAMIN) 500 MCG tablet Take 500 mcg by mouth every morning.        No current facility-administered medications on file prior to visit.    BP 130/78  Pulse 78  Temp(Src) 98.7 F (37.1 C) (Oral)  Wt 117 lb (53.071 kg)chart    Objective:   Physical Exam  Constitutional: She is oriented to person, place, and time. She appears well-developed and well-nourished.  HENT:  Right Ear: External ear normal.  Left Ear: External ear normal.  Nose: Nose normal.  Mouth/Throat: Oropharynx is clear and moist.  Clear nasal drainage  Neck: Normal range of motion. Neck supple.  Cardiovascular: Normal rate, regular rhythm and normal heart sounds.   Pulmonary/Chest: Effort normal and breath sounds normal.  Musculoskeletal: Normal range of motion.  Neurological: She is alert and oriented to person, place, and time.  Skin: Skin is warm and dry.  Psychiatric: She has a normal mood and affect.          Assessment & Plan:  Assessment: 1. Upper respiratory infection 2. Pulmonary  hypertension  Plan: Prednisone 40 mg daily x5 days. Then resume 5 mg daily. Patient the office with any questions or concerns. Check as scheduled, and as needed.

## 2013-12-15 NOTE — Progress Notes (Signed)
Pre visit review using our clinic review tool, if applicable. No additional management support is needed unless otherwise documented below in the visit note.

## 2013-12-15 NOTE — Telephone Encounter (Signed)
Pt has been approved for Tyvaso and [facility] has been weaning her flolan down, spoke w/Teresa, RN with [facility], she states pt is ready to be transitioned from [place] to tyvaso but she feels pt needs to be in a monitored setting for most of the day for this, per Dr [name] ok to bring pt in for observation on tele unit.  This has been arranged for [facility] 12/18 pt is aware hospital will call her when to come, [name] is also aware, she will come into hospital and make transition for pt

## 2013-12-17 ENCOUNTER — Encounter (HOSPITAL_COMMUNITY): Payer: Self-pay | Admitting: General Practice

## 2013-12-17 ENCOUNTER — Observation Stay (HOSPITAL_COMMUNITY)
Admission: AD | Admit: 2013-12-17 | Discharge: 2013-12-18 | Disposition: A | Discharge status: Home / Self Care | Payer: BC Managed Care – PPO | Source: Ambulatory Visit | Attending: Internal Medicine | Admitting: Internal Medicine

## 2013-12-17 DIAGNOSIS — L940 Localized scleroderma [morphea]: Secondary | ICD-10-CM | POA: Insufficient documentation

## 2013-12-17 DIAGNOSIS — I2789 Other specified pulmonary heart diseases: Principal | ICD-10-CM | POA: Insufficient documentation

## 2013-12-17 DIAGNOSIS — Z7982 Long term (current) use of aspirin: Secondary | ICD-10-CM | POA: Insufficient documentation

## 2013-12-17 DIAGNOSIS — S8990XA Unspecified injury of unspecified lower leg, initial encounter: Secondary | ICD-10-CM | POA: Insufficient documentation

## 2013-12-17 DIAGNOSIS — Z9221 Personal history of antineoplastic chemotherapy: Secondary | ICD-10-CM | POA: Insufficient documentation

## 2013-12-17 DIAGNOSIS — C8589 Other specified types of non-Hodgkin lymphoma, extranodal and solid organ sites: Secondary | ICD-10-CM | POA: Insufficient documentation

## 2013-12-17 DIAGNOSIS — X58XXXA Exposure to other specified factors, initial encounter: Secondary | ICD-10-CM | POA: Insufficient documentation

## 2013-12-17 DIAGNOSIS — I272 Pulmonary hypertension, unspecified: Secondary | ICD-10-CM

## 2013-12-17 HISTORY — DX: Pulmonary hypertension, unspecified: I27.20

## 2013-12-17 LAB — CBC WITH DIFFERENTIAL/PLATELET
Basophils Absolute: 0 10*3/uL (ref 0.0–0.1)
Basophils Relative: 0 % (ref 0–1)
Eosinophils Absolute: 0.1 10*3/uL (ref 0.0–0.7)
Eosinophils Relative: 1 % (ref 0–5)
HCT: 41.1 % (ref 36.0–46.0)
Hemoglobin: 13.5 g/dL (ref 12.0–15.0)
Lymphocytes Relative: 22 % (ref 12–46)
Lymphs Abs: 1.6 10*3/uL (ref 0.7–4.0)
MCH: 29 pg (ref 26.0–34.0)
MCHC: 32.8 g/dL (ref 30.0–36.0)
MCV: 88.4 fL (ref 78.0–100.0)
Monocytes Absolute: 0.9 10*3/uL (ref 0.1–1.0)
Monocytes Relative: 13 % — ABNORMAL HIGH (ref 3–12)
Neutro Abs: 4.5 10*3/uL (ref 1.7–7.7)
Neutrophils Relative %: 64 % (ref 43–77)
Platelets: 204 10*3/uL (ref 150–400)
RBC: 4.65 MIL/uL (ref 3.87–5.11)
RDW: 21.9 % — ABNORMAL HIGH (ref 11.5–15.5)
WBC: 7.1 10*3/uL (ref 4.0–10.5)

## 2013-12-17 LAB — COMPREHENSIVE METABOLIC PANEL
ALT: 19 U/L (ref 0–35)
AST: 31 U/L (ref 0–37)
Albumin: 3.6 g/dL (ref 3.5–5.2)
Alkaline Phosphatase: 94 U/L (ref 39–117)
BUN: 29 mg/dL — ABNORMAL HIGH (ref 6–23)
CO2: 31 mEq/L (ref 19–32)
Calcium: 9 mg/dL (ref 8.4–10.5)
Chloride: 97 mEq/L (ref 96–112)
Creatinine, Ser: 0.91 mg/dL (ref 0.50–1.10)
GFR calc Af Amer: 76 mL/min — ABNORMAL LOW (ref >90)
GFR calc non Af Amer: 65 mL/min — ABNORMAL LOW (ref >90)
Glucose, Bld: 84 mg/dL (ref 70–99)
Potassium: 3.7 mEq/L (ref 3.5–5.1)
Sodium: 139 mEq/L (ref 135–145)
Total Bilirubin: 0.3 mg/dL (ref 0.3–1.2)
Total Protein: 7.5 g/dL (ref 6.0–8.3)

## 2013-12-17 LAB — MRSA PCR SCREENING: MRSA by PCR: POSITIVE — AB

## 2013-12-17 MED ORDER — CYANOCOBALAMIN 500 MCG PO TABS
500.0000 ug | ORAL_TABLET | Freq: Every day | ORAL | Status: DC
  Administered 2013-12-18: 500 ug via ORAL
  Filled 2013-12-17: qty 1

## 2013-12-17 MED ORDER — CHLORHEXIDINE GLUCONATE CLOTH 2 % EX PADS
6.0000 | MEDICATED_PAD | Freq: Every day | CUTANEOUS | Status: DC
  Administered 2013-12-18: 6 via TOPICAL

## 2013-12-17 MED ORDER — AZELASTINE HCL 0.1 % NA SOLN: 2.0000 | Freq: Two times a day (BID) | NASAL | Status: DC | PRN

## 2013-12-17 MED ORDER — OMEGA-3 FATTY ACIDS 1000 MG PO CAPS: 1.0000 g | ORAL_CAPSULE | Freq: Every day | ORAL | Status: DC

## 2013-12-17 MED ORDER — PANTOPRAZOLE SODIUM 40 MG PO TBEC
40.0000 mg | DELAYED_RELEASE_TABLET | Freq: Every day | ORAL | Status: DC
  Administered 2013-12-17: 40 mg via ORAL
  Filled 2013-12-17: qty 1

## 2013-12-17 MED ORDER — GABAPENTIN 300 MG PO CAPS
300.0000 mg | ORAL_CAPSULE | Freq: Two times a day (BID) | ORAL | Status: DC
  Administered 2013-12-17: 300 mg via ORAL
  Filled 2013-12-17 (×3): qty 1

## 2013-12-17 MED ORDER — VITAMIN B-12 500 MCG PO TABS: 500.0000 ug | ORAL_TABLET | Freq: Every morning | ORAL | Status: DC

## 2013-12-17 MED ORDER — ONDANSETRON HCL 4 MG/2ML IJ SOLN: 4.0000 mg | Freq: Four times a day (QID) | INTRAMUSCULAR | Status: DC | PRN

## 2013-12-17 MED ORDER — RISAQUAD PO CAPS
1.0000 | ORAL_CAPSULE | Freq: Every morning | ORAL | Status: DC
  Administered 2013-12-18: 1 via ORAL
  Filled 2013-12-17: qty 1

## 2013-12-17 MED ORDER — VITAMIN D3 25 MCG (1000 UNIT) PO TABS
2000.0000 [IU] | ORAL_TABLET | Freq: Every evening | ORAL | Status: DC
  Administered 2013-12-17: 2000 [IU] via ORAL
  Filled 2013-12-17 (×2): qty 2

## 2013-12-17 MED ORDER — SILDENAFIL CITRATE 20 MG PO TABS
40.0000 mg | ORAL_TABLET | Freq: Three times a day (TID) | ORAL | Status: DC
  Administered 2013-12-17 – 2013-12-18 (×3): 40 mg via ORAL
  Filled 2013-12-17 (×5): qty 2

## 2013-12-17 MED ORDER — FUROSEMIDE 80 MG PO TABS
80.0000 mg | ORAL_TABLET | Freq: Two times a day (BID) | ORAL | Status: DC
  Administered 2013-12-17 – 2013-12-18 (×2): 80 mg via ORAL
  Filled 2013-12-17 (×4): qty 1

## 2013-12-17 MED ORDER — ADULT MULTIVITAMIN W/MINERALS CH
2.0000 | ORAL_TABLET | Freq: Two times a day (BID) | ORAL | Status: DC
  Administered 2013-12-17: 2 via ORAL
  Filled 2013-12-17 (×3): qty 2

## 2013-12-17 MED ORDER — OMEGA-3-ACID ETHYL ESTERS 1 G PO CAPS
1.0000 g | ORAL_CAPSULE | ORAL | Status: DC
  Administered 2013-12-17: 1 g via ORAL
  Filled 2013-12-17 (×2): qty 1

## 2013-12-17 MED ORDER — BOSENTAN 125 MG PO TABS
125.0000 mg | ORAL_TABLET | Freq: Two times a day (BID) | ORAL | Status: DC
  Administered 2013-12-17 – 2013-12-18 (×2): 125 mg via ORAL
  Filled 2013-12-17: qty 1

## 2013-12-17 MED ORDER — TREPROSTINIL 0.6 MG/ML IN SOLN
18.0000 ug | Freq: Four times a day (QID) | RESPIRATORY_TRACT | Status: DC
  Administered 2013-12-18: 18 ug via RESPIRATORY_TRACT

## 2013-12-17 MED ORDER — PREDNISONE 20 MG PO TABS
40.0000 mg | ORAL_TABLET | Freq: Every day | ORAL | Status: DC
Start: 2013-12-17 — End: 2013-12-18
  Administered 2013-12-17: 40 mg via ORAL
  Filled 2013-12-17 (×2): qty 2

## 2013-12-17 MED ORDER — FLUTICASONE PROPIONATE 50 MCG/ACT NA SUSP
1.0000 | Freq: Every day | NASAL | Status: DC
  Filled 2013-12-17: qty 16

## 2013-12-17 MED ORDER — SODIUM CHLORIDE 0.9 % IJ SOLN
10.0000 mL | INTRAMUSCULAR | Status: DC | PRN
  Administered 2013-12-17 – 2013-12-18 (×2): 10 mL

## 2013-12-17 MED ORDER — ASPIRIN EC 81 MG PO TBEC
81.0000 mg | DELAYED_RELEASE_TABLET | Freq: Every day | ORAL | Status: DC
  Administered 2013-12-17: 81 mg via ORAL
  Filled 2013-12-17 (×2): qty 1

## 2013-12-17 MED ORDER — ACETAMINOPHEN 325 MG PO TABS: 650.0000 mg | ORAL_TABLET | ORAL | Status: DC | PRN

## 2013-12-17 MED ORDER — SODIUM CHLORIDE 0.9 % IV SOLN: 250.0000 mL | INTRAVENOUS | Status: DC | PRN

## 2013-12-17 MED ORDER — GATIFLOXACIN 0.5 % OP SOLN
1.0000 [drp] | Freq: Four times a day (QID) | OPHTHALMIC | Status: DC
Start: 2013-12-17 — End: 2013-12-18
  Administered 2013-12-17: 1 [drp] via OPHTHALMIC
  Filled 2013-12-17: qty 2.5

## 2013-12-17 MED ORDER — MUPIROCIN 2 % EX OINT
1.0000 "application " | TOPICAL_OINTMENT | Freq: Two times a day (BID) | CUTANEOUS | Status: DC
  Administered 2013-12-17 (×2): 1 via NASAL
  Filled 2013-12-17: qty 22

## 2013-12-17 MED ORDER — ENOXAPARIN SODIUM 40 MG/0.4ML ~~LOC~~ SOLN
40.0000 mg | SUBCUTANEOUS | Status: DC
  Filled 2013-12-17 (×2): qty 0.4

## 2013-12-17 MED ORDER — SPIRONOLACTONE 25 MG PO TABS
25.0000 mg | ORAL_TABLET | Freq: Every morning | ORAL | Status: DC
  Administered 2013-12-18: 25 mg via ORAL
  Filled 2013-12-17: qty 1

## 2013-12-17 MED ORDER — SODIUM CHLORIDE 0.9 % IJ SOLN: 3.0000 mL | Freq: Two times a day (BID) | INTRAMUSCULAR | Status: DC

## 2013-12-17 MED ORDER — ASPIRIN 81 MG PO TABS: 81.0000 mg | ORAL_TABLET | Freq: Every day | ORAL | Status: DC

## 2013-12-17 MED ORDER — SODIUM CHLORIDE 0.9 % IJ SOLN: 3.0000 mL | INTRAMUSCULAR | Status: DC | PRN

## 2013-12-17 MED ORDER — MULTIVITAMINS PO TABS: 2.0000 | ORAL_TABLET | Freq: Two times a day (BID) | ORAL | Status: DC

## 2013-12-17 MED ORDER — CLOPIDOGREL BISULFATE 75 MG PO TABS
75.0000 mg | ORAL_TABLET | Freq: Every day | ORAL | Status: DC
  Administered 2013-12-18: 75 mg via ORAL
  Filled 2013-12-17 (×2): qty 1

## 2013-12-17 NOTE — Progress Notes (Signed)
   Now off Veletri. No dyspnea. Feels well. Anxious to go home.  Order written for removal of [place] in am.  Benay Spice 7:29 PM

## 2013-12-17 NOTE — H&P (Signed)
Advanced Heart Failure H&P   HPI: Ms. Battaglia is a 62 y.o. 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 With regard to her scleroderma. Was following at [facility] but rheumatologist left. Prednisone was stopped several years ago and has not been anything for her scleroderma since that time.  Diagnosed with PAH in 2007 in [place].  She has been treated with cellcept, tracleer and revatio for approximately one to 2 years, and then meds stopped because her ECHO improved. . Echo in 2011 as a f/u for her chemotherapy. LV EF 65-70%. RV severely enlarged/HK severe TR. pRVSP 115-120.   Repeat RHC 11/14/12  RA = 14  RV = 84/8/20  PA = 83/29 (50)  PCW = 10  Fick cardiac output/index = 4.5/2.5  Thermo CO/CI = 3.8/2.2  PVR = 8.9 Woods (Fick)  FA sat = 86% on 3L  PA sat = 55%, 57%  ECHO 08/2013 EF 65 % Peak PA pressure 67 mmhg  Prior to admit she has been followed by Dr Gilles Chiquito and started on IV epoprostenol (Veletri) in 01/2013. Also on bosentan 10 and Revatio 40 tid. She has been weaning off Veletri as it did not seem to improve symptoms and she wanted to transition of IV therapies to an inhaled agent.   She is a scheduled admit to transition from Veletri to Tyvaso and monitor for rebound pulmonary HTN.   Review of Systems:     Cardiac Review of Systems: {Y] = yes _0  = no  Chest Pain [    ]  Resting SOB [   ] Exertional SOB  [Y  ]  Orthopnea [  ]   Pedal Edema [   ]    Palpitations [  ] Syncope  [  ]   Presyncope [   ]  General Review of Systems: [Y] = yes [  ]=no Constitional: recent weight change [  ]; anorexia [  ]; fatigue [ Y ]; nausea [  ]; night sweats [  ]; fever [  ]; or chills [  ];                                                                                                                                          Dental: poor dentition[  ]; Last Dentist visit:    Eye : blurred vision [  ]; diplopia [   ]; vision changes [  ];  Amaurosis fugax[  ]; Resp: cough [  ];  wheezing[  ];  hemoptysis[  ]; shortness of breath[Y  ]; paroxysmal nocturnal dyspnea[  ]; dyspnea on exertion[ Y ]; or orthopnea[  ];  GI:  gallstones[  ], vomiting[  ];  dysphagia[  ]; melena[  ];  hematochezia [  ]; heartburn[  ];  GU: kidney stones [  ]; hematuria[  ];   dysuria [  ];  nocturia[  ];  history of     obstruction [  ];                 Skin: rash, swelling[  ];, hair loss[  ];  peripheral edema[  ];  or itching[  ]; Musculosketetal: myalgias[  ];  joint swelling[  ];  joint erythema[  ];  joint pain[ Y ];  back pain[  ];  Heme/Lymph: bruising[ Y ];  bleeding[  ];  anemia[  ];  Neuro: TIA[  ];  headaches[  ];  stroke[  ];  vertigo[  ];  seizures[  ];   paresthesias[  ];  difficulty walking[  ];  Psych:depression[  ]; anxiety[  ];  Endocrine: diabetes[  ];  thyroid dysfunction[  ];   Other: LLE wound  Past Medical History  Diagnosis Date  . History of chicken pox   . Hypertension   . Sclerodermia generalized   . Non Hodgkin's lymphoma     Treated with chemo 2010, felt to be cured.  . Melanoma 1999  . Peripheral arterial occlusive disease   . Neuropathic foot ulcer   . DOE (dyspnea on exertion)   . PAH (pulmonary artery hypertension)   . Epistaxis   . Pulmonary hypertension     Medications Prior to Admission  Medication Sig Dispense Refill  . acidophilus (RISAQUAD) CAPS Take 1 capsule by mouth every morning.       Marland Kitchen aspirin 81 MG tablet Take 81 mg by mouth every evening.       Marland Kitchen azelastine (ASTELIN) 137 MCG/SPRAY nasal spray Place 2 sprays into the nose 2 (two) times daily as needed for rhinitis. Use in each nostril as directed      . bosentan (TRACLEER) 125 MG tablet Take 125 mg by mouth 2 (two) times daily.      . cholecalciferol (VITAMIN D) 1000 UNITS tablet Take 2,000 Units by mouth every evening.       . clopidogrel (PLAVIX) 75 MG tablet take 1 tablet  by mouth once daily  30 tablet  12  . Epoprostenol Sodium (VELETRI IV) Inject 1.5 mLs into the vein continuous.       . fish oil-omega-3 fatty acids 1000 MG capsule Take 1 g by mouth daily with lunch.       . furosemide (LASIX) 80 MG tablet Take 1 tablet (80 mg total) by mouth 2 (two) times daily.  60 tablet  6  . gabapentin (NEURONTIN) 300 MG capsule Take 300 mg by mouth 2 (two) times daily. Take 1 at noon and 1 at bedtime      . mometasone (NASONEX) 50 MCG/ACT nasal spray Place 2 sprays into the nose daily as needed. 1-2 sprays once daily      . moxifloxacin (VIGAMOX) 0.5 % ophthalmic solution Place 1 drop into both eyes 3 (three) times daily.      . multivitamin (THERAGRAN) per tablet Take 2 tablets by mouth 2 (two) times daily.       . pantoprazole (PROTONIX) 40 MG tablet Take 1 tablet (40 mg total) by mouth daily.  30 tablet  3  . predniSONE (DELTASONE) 20 MG tablet Take 2 tablets (40 mg total) by mouth daily.  10 tablet  0  . sildenafil (REVATIO) 20 MG tablet Take 40 mg by mouth 3 (three) times daily.      Marland Kitchen spironolactone (ALDACTONE) 25  MG tablet Take 25 mg by mouth every morning.      . vitamin B-12 (CYANOCOBALAMIN) 500 MCG tablet Take 500 mcg by mouth every morning.          Allergies  Allergen Reactions  . Sulfa Antibiotics     Severe rash with significant peeling of face. No airway involvement per patient  . Levaquin [Levofloxacin In D5w] Other (See Comments)    Other reaction(s): Other (See Comments) FLU LIKE SYMPTOM  . Penicillins Rash    History   Social History  . Marital Status: Married    Spouse Name: N/A    Number of Children: 1  . Years of Education: N/A   Occupational History  . homemaker    Social History Main Topics  . Smoking status: Never Smoker   . Smokeless tobacco: Never Used  . Alcohol Use: Yes     Comment: Occasional  . Drug Use: No  . Sexual Activity: Not Currently   Other Topics Concern  . Not on file   Social History Narrative  . No  narrative on file    Family History  Problem Relation Age of Onset  . Lupus Father   . Lymphoma Father   . Hyperlipidemia Mother   . Cancer Daughter     sarcoma  . Colon cancer      PHYSICAL EXAM: Filed Vitals:   12/17/13 0900  BP: 116/62  Pulse: 72  Temp: 98 F (36.7 C)  Resp: 20   PHYSICAL EXAM:  General: No acute distress No respiratory difficulty  Husband at bedside HEENT: normal  Neck: supple. JVP 6-7 Carotids 2+ bilat; no bruits. No lymphadenopathy or thryomegaly appreciated.  Cor: PMI nondisplaced. Regular rate & rhythm. Prominent P2. Soft TR murmur. + RV lift Hickman catheter Lungs: clear on 5 liters [place]  Abdomen: soft, nontender, nondistended. No hepatosplenomegaly. No bruits or masses. Good bowel sounds.  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 Minimal edema. + gouty nodules. RLE wound  Neuro: alert & oriented x 3, cranial nerves grossly intact. moves all 4 extremities w/o difficulty. Affect pleasant.       No results found for this or any previous visit (from the past 24 hour(s)). No results found.   ASSESSMENT: 1. PAH - on veletri, macitentan and sildenafil  2. Scleroderma  3. Chronic RLE wound 4. H/O NHL   PLAN/DISCUSSION:  Mrs Amer is admitted today for transition off Veletri  to Tyvaso. [facility] rep at bedside to start Tyvaso and stop veletri. [facility] providing education on Tyvaso administration and equipment.   Plan in D/C in am if she tolerates Tyvaso.    CLEGG,AMY NP-C 11:33 AM  Patient seen and examined with Darrick Grinder, NP. We discussed all aspects of the encounter. I agree with the assessment and plan as stated above.   Ms. Kunz admitted today for monitoring in the setting of weaning off IV epoprostenol to inhaled treprostinol. Concern is over possible rebound effect. Protocol reviewed and is being managed by [facility] rep. Volume status looks good. Can pull Hickman catheter in am if transitions goes well.    Daniel Bensimhon,MD 5:59 PM

## 2013-12-18 ENCOUNTER — Observation Stay (HOSPITAL_COMMUNITY): Payer: BC Managed Care – PPO

## 2013-12-18 LAB — BASIC METABOLIC PANEL
BUN: 27 mg/dL — ABNORMAL HIGH (ref 6–23)
CO2: 30 mEq/L (ref 19–32)
Calcium: 8.9 mg/dL (ref 8.4–10.5)
Chloride: 100 mEq/L (ref 96–112)
Creatinine, Ser: 0.86 mg/dL (ref 0.50–1.10)
GFR calc Af Amer: 81 mL/min — ABNORMAL LOW (ref >90)
GFR calc non Af Amer: 70 mL/min — ABNORMAL LOW (ref >90)
Glucose, Bld: 102 mg/dL — ABNORMAL HIGH (ref 70–99)
Potassium: 3.5 mEq/L (ref 3.5–5.1)
Sodium: 142 mEq/L (ref 135–145)

## 2013-12-18 MED ORDER — TREPROSTINIL 0.6 MG/ML IN SOLN: 18.0000 ug | Freq: Four times a day (QID) | RESPIRATORY_TRACT | Status: DC

## 2013-12-18 MED ORDER — CHLORHEXIDINE GLUCONATE 4 % EX LIQD
CUTANEOUS | Status: AC
  Filled 2013-12-18: qty 30

## 2013-12-18 NOTE — Procedures (Signed)
Successful removal of right IJ tunneled hickman catheter, no longer needed. No immediate complications.  Tsosie Billing PA-C Interventional Radiology  12/18/13  10:59 AM

## 2013-12-18 NOTE — Discharge Summary (Signed)
Advanced Heart Failure Team  Discharge Summary   [name] MRN: [id number], DOB/AGE: 1951/05/19 62 y.o. Admit date: 12/17/2013 D/C date:     12/18/2013   Primary Discharge Diagnoses:  1. PAH - on  bosentan and sildenafil .  Veletri was weaned off and transitioned to Tyvaso during 12/07/13 admit  2. Scleroderma  3. Chronic RLE wound  4. H/O NHL    Hospital Course:  [name] is a 62 y.o. 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 With regard to her scleroderma. Was following at [facility] but rheumatologist left. Prednisone was stopped several years ago and has not been anything for her scleroderma since that time.   Diagnosed with PAH in 2007 in [place]. She has been treated with cellcept, tracleer and revatio for approximately one to 2 years, and then meds stopped because her ECHO improved. . Echo in 2011 as a f/u for her chemotherapy. LV EF 65-70%. RV severely enlarged/HK severe TR. pRVSP 115-120.   Repeat RHC 11/14/12  RA = 14  RV = 84/8/20  PA = 83/29 (50)  PCW = 10  Fick cardiac output/index = 4.5/2.5  Thermo CO/CI = 3.8/2.2  PVR = 8.9 Woods (Fick)  FA sat = 86% on 3L  PA sat = 55%, 57%  ECHO 08/2013 EF 65 % Peak PA pressure 67 mmhg   Prior to admit she has been followed by Dr Gilles Chiquito and started on IV epoprostenol (Veletri) in 01/2013. Also on bosentan 10 and Revatio 40 tid. She has been weaning off Veletri as it did not seem to improve symptoms and she wanted to transition of IV therapies to an inhaled agent.   She was a scheduled admit to  transition from Veletri to Tyvaso and monitor for rebound pulmonary HTN. [facility] representative weaned Veletri as she transitioned to Tyvaso.  She tolerated this well and she was discharged home with Tyvaso and equipment. Hickman catheter was removed prior to discharge.   She will continue to be followed in  the HF clinic with follow up scheduled January 8 at 2:45. Will check 6MW at follow up.    Discharge Weight Range: Discharge weight 119 pounds Discharge Vitals: Blood pressure 117/67, pulse 57, temperature 97.4 F (36.3 C), temperature source Oral, resp. rate 20, height _0  (1.727 m), weight 119 lb 4.3 oz (54.1 kg), SpO2 100.00%.  Labs: Lab Results  Component Value Date   WBC 7.1 12/17/2013   HGB 13.5 12/17/2013   HCT 41.1 12/17/2013   MCV 88.4 12/17/2013   PLT 204 12/17/2013     Recent Labs Lab 12/17/13 1150 12/18/13 0350  NA 139 142  K 3.7 3.5  CL 97 100  CO2 31 30  BUN 29* 27*  CREATININE 0.91 0.86  CALCIUM 9.0 8.9  PROT 7.5  --   BILITOT 0.3  --   ALKPHOS 94  --   ALT 19  --   AST 31  --   GLUCOSE 84 102*   No results found for this basename: CHOL,  HDL,  LDLCALC,  TRIG   BNP (last 3 results) No results found for this basename: PROBNP,  in the last 8760 hours  Diagnostic Studies/Procedures   No results found.  Discharge Medications     Medication List    STOP taking these medications       VELETRI IV  TAKE these medications       acidophilus Caps capsule  Take 1 capsule by mouth every morning.     aspirin 81 MG tablet  Take 81 mg by mouth every evening.     azelastine 137 MCG/SPRAY nasal spray  Commonly known as:  ASTELIN  Place 2 sprays into the nose 2 (two) times daily as needed for rhinitis. Use in each nostril as directed     bosentan 125 MG tablet  Commonly known as:  TRACLEER  Take 125 mg by mouth 2 (two) times daily.     cholecalciferol 1000 UNITS tablet  Commonly known as:  VITAMIN D  Take 2,000 Units by mouth every evening.     clopidogrel 75 MG tablet  Commonly known as:  PLAVIX  take 1 tablet by mouth once daily     fish oil-omega-3 fatty acids 1000 MG capsule  Take 1 g by mouth daily with lunch.     furosemide 80 MG tablet  Commonly known as:  LASIX  Take 1 tablet (80 mg total) by mouth 2 (two) times daily.      gabapentin 300 MG capsule  Commonly known as:  NEURONTIN  Take 300 mg by mouth 2 (two) times daily. Take 1 at noon and 1 at bedtime     mometasone 50 MCG/ACT nasal spray  Commonly known as:  NASONEX  Place 2 sprays into the nose daily as needed. 1-2 sprays once daily     moxifloxacin 0.5 % ophthalmic solution  Commonly known as:  VIGAMOX  Place 1 drop into both eyes 3 (three) times daily.     multivitamin per tablet  Take 2 tablets by mouth 2 (two) times daily.     pantoprazole 40 MG tablet  Commonly known as:  PROTONIX  Take 1 tablet (40 mg total) by mouth daily.     predniSONE 20 MG tablet  Commonly known as:  DELTASONE  Take 2 tablets (40 mg total) by mouth daily.     sildenafil 20 MG tablet  Commonly known as:  REVATIO  Take 40 mg by mouth 3 (three) times daily.     spironolactone 25 MG tablet  Commonly known as:  ALDACTONE  Take 25 mg by mouth every morning.     Treprostinil 0.6 MG/ML Soln  Commonly known as:  TYVASO  Inhale 18 mcg into the lungs 4 (four) times daily.     vitamin B-12 500 MCG tablet  Commonly known as:  CYANOCOBALAMIN  Take 500 mcg by mouth every morning.        Disposition   The patient will be discharged in stable condition to home. Discharge Orders   Future Appointments Provider Department Dept Phone   01/04/2014 11:45 AM Ricard Dillon, MD [facility] [place] [phone]   01/06/2014 2:30 PM Collene Gobble, MD [facility] [phone]   01/07/2014 2:45 PM [facility] [facility] [phone]   01/22/2014 11:00 AM [facility] [phone]   Please arrive 15 minutes prior to your appointment time.   Future Orders Complete By Expires   (HEART FAILURE PATIENTS) Call MD:  Anytime you have any of the following symptoms: 1) 3 pound weight gain in 24 hours or 5 pounds in 1 week 2) shortness of breath, with or without a dry hacking cough 3) swelling  in the hands, feet or stomach 4) if you have to sleep on extra pillows at night  in order to breathe.  As directed    Contraindication to ACEI at discharge  As directed    Comments:     Pulmonary hypertension   Diet - low sodium heart healthy  As directed    Heart Failure patients record your daily weight using the same scale at the same time of day  As directed    Increase activity slowly  As directed      Follow-up Information   Follow up with Glori Bickers, MD On 01/07/2014. (At 2: [place])    Specialty:  Cardiology   Contact information:   [address] [place] [address] [phone]         Duration of Discharge Encounter: Greater than 35 minutes   Signed, CLEGG,AMY NP-C 12/18/2013, 11:11 AM   Glori Bickers MD

## 2013-12-18 NOTE — Progress Notes (Signed)
Advanced Heart Failure Rounding Note   Subjective:    Veleltri stopped yesterday and she transitioned to Tyvaso without complications.   Denies SOB/PND. Wants to go home.     Objective:   Weight Range:  Vital Signs:   Temp:  [97.4 F (36.3 C)-98.1 F (36.7 C)] 97.4 F (36.3 C) (12/19 0512) Pulse Rate:  [57-72] 57 (12/18 2255) Resp:  [20] 20 (12/19 0512) BP: (111-127)/(57-67) 117/67 mmHg (12/19 0512) SpO2:  [100 %] 100 % (12/19 0512) Weight:  [119 lb 4.3 oz (54.1 kg)-122 lb 1.6 oz (55.384 kg)] 119 lb 4.3 oz (54.1 kg) (12/19 0512) Last BM Date: 12/17/13  Weight change: Filed Weights   12/17/13 0900 12/18/13 0512  Weight: 122 lb 1.6 oz (55.384 kg) 119 lb 4.3 oz (54.1 kg)    Intake/Output:   Intake/Output Summary (Last 24 hours) at 12/18/13 0820 Last data filed at 12/18/13 0511  Gross per 24 hour  Intake    240 ml  Output   2550 ml  Net  -2310 ml    PHYSICAL EXAM:  General: No acute distress No respiratory difficulty. Sitting on the side of the bed.  Husband at bedside  HEENT: normal  Neck: supple. JVP 6-7 Carotids 2+ bilat; no bruits. No lymphadenopathy or thryomegaly appreciated.  Cor: PMI nondisplaced. Regular rate & rhythm. Prominent P2. Soft TR murmur. + RV lift Hickman catheter  Lungs: clear on 5 liters [place]  Abdomen: soft, nontender, nondistended. No hepatosplenomegaly. No bruits or masses. Good bowel sounds.  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 Minimal edema. + gouty nodules. RLE wound  Neuro: alert & oriented x 3, cranial nerves grossly intact. moves all 4 extremities w/o difficulty. Affect pleasant.       Telemetry: SR 60s  Labs: Basic Metabolic Panel:  Recent Labs Lab 12/17/13 1150 12/18/13 0350  NA 139 142  K 3.7 3.5  CL 97 100  CO2 31 30  GLUCOSE 84 102*  BUN 29* 27*  CREATININE 0.91 0.86  CALCIUM 9.0 8.9    Liver Function Tests:  Recent Labs Lab 12/17/13 1150  AST 31  ALT 19  ALKPHOS 94   BILITOT 0.3  PROT 7.5  ALBUMIN 3.6   No results found for this basename: LIPASE, AMYLASE,  in the last 168 hours No results found for this basename: AMMONIA,  in the last 168 hours  CBC:  Recent Labs Lab 12/17/13 1150  WBC 7.1  NEUTROABS 4.5  HGB 13.5  HCT 41.1  MCV 88.4  PLT 204    Cardiac Enzymes: No results found for this basename: CKTOTAL, CKMB, CKMBINDEX, TROPONINI,  in the last 168 hours  BNP: BNP (last 3 results) No results found for this basename: PROBNP,  in the last 8760 hours   Other results:  EKG:   Imaging:  No results found.   Medications:     Scheduled Medications: . acidophilus  1 capsule Oral q morning - 10a  . aspirin EC  81 mg Oral Daily  . bosentan  125 mg Oral BID  . Chlorhexidine Gluconate Cloth  6 each Topical Q0600  . cholecalciferol  2,000 Units Oral QPM  . clopidogrel  75 mg Oral Q breakfast  . vitamin B-12  500 mcg Oral Daily  . enoxaparin (LOVENOX) injection  40 mg Subcutaneous Q24H  . fluticasone  1 spray Each Nare Daily  . furosemide  80 mg Oral BID  . gabapentin  300 mg Oral BID  . gatifloxacin  1  drop Both Eyes QID  . multivitamin with minerals  2 tablet Oral BID  . mupirocin ointment  1 application Nasal BID  . omega-3 acid ethyl esters  1 g Oral Q24H  . pantoprazole  40 mg Oral Daily  . predniSONE  40 mg Oral Daily  . sildenafil  40 mg Oral TID  . sodium chloride  3 mL Intravenous Q12H  . spironolactone  25 mg Oral q morning - 10a  . Treprostinil  18 mcg Inhalation QID     Infusions:     PRN Medications:  sodium chloride, acetaminophen, azelastine, ondansetron (ZOFRAN) IV, sodium chloride, sodium chloride   Assessment:   1. PAH - on veletri, macitentan and sildenafil  2. Scleroderma  3. Chronic RLE wound  4. H/O NHL   Plan/Discussion:    Tolerating Tyvaso. No volume overload. Remove hickman. D/C later today.   Length of Stay: 1   CLEGG,AMY 12/18/2013, 8:20 AM  Advanced Heart Failure  Team Pager [phone] (M-F; 7a - 4p)  Please contact [facility] for night-coverage after hours (4p -7a ) and weekends on [email or web address]  Patient seen and examined with Darrick Grinder, NP. We discussed all aspects of the encounter. I agree with the assessment and plan as stated above.   Has tolerated stopping epoprostenol well and now is on Tyvaso. No evidence of rebound PAH. Will pull Hickman today and can d/c home  Benay Spice 9:39 AM

## 2013-12-18 NOTE — Progress Notes (Signed)
Pt/family given discharge instructions, medication lists, follow up appointments, and when to call the doctor.  Pt/family verbalizes understanding [name] [name]   

## 2013-12-21 ENCOUNTER — Other Ambulatory Visit: Payer: Self-pay | Admitting: Internal Medicine

## 2013-12-23 ENCOUNTER — Telehealth (HOSPITAL_COMMUNITY): Payer: Self-pay | Admitting: *Deleted

## 2013-12-23 NOTE — Telephone Encounter (Signed)
Pt called c/o increased SOB, she is now on Tyvaso 9 breaths 4 times a day and feels that is too much, she states she had the same symptoms when her veletri was increased and then it had to be decreased.  Discussed with [place] they state pt is on less tyvaso then veletri as it was a 24 hour infusion and she probably needs more tyvaso to help with sob they recommend increasing to 12 breaths, discussed w/Dr Bensimhon he feels ok to try 12 breaths for a day or 2 and if not helping can decrease to 6 breaths, as pt had hard time with veletri.  Pt is aware and agreeable, [facility] is aware of our plan is well, nurse will go see pt on Monday they will a nurse touch base with her via phone over holiday to check on her

## 2013-12-29 ENCOUNTER — Telehealth (HOSPITAL_COMMUNITY): Payer: Self-pay | Admitting: Cardiology

## 2013-12-29 NOTE — Telephone Encounter (Signed)
Will need a new rx for increase in tyvaso was taking 3-9 breaths, pt states she was increased to 12 breaths  If this is correct will need new rx called into [facility]

## 2013-12-29 NOTE — Telephone Encounter (Signed)
Pt only increased to 12 breaths for a couple of days and then decreased to 6 breaths, she states she is feeling better on this dose and will continue, spoke with [facility] will keep rx the same

## 2014-01-04 ENCOUNTER — Encounter: Payer: Self-pay | Admitting: Internal Medicine

## 2014-01-04 ENCOUNTER — Ambulatory Visit (INDEPENDENT_AMBULATORY_CARE_PROVIDER_SITE_OTHER): Payer: BC Managed Care – PPO | Admitting: Internal Medicine

## 2014-01-04 VITALS — BP 140/82 | HR 76 | Temp 97.7°F | Resp 20 | Ht 68.0 in | Wt 126.0 lb

## 2014-01-04 DIAGNOSIS — F3289 Other specified depressive episodes: Secondary | ICD-10-CM

## 2014-01-04 DIAGNOSIS — F32A Depression, unspecified: Secondary | ICD-10-CM

## 2014-01-04 DIAGNOSIS — F329 Major depressive disorder, single episode, unspecified: Secondary | ICD-10-CM

## 2014-01-04 MED ORDER — CLONAZEPAM 0.5 MG PO TABS: 0.2500 mg | ORAL_TABLET | Freq: Two times a day (BID) | ORAL | Status: DC | PRN

## 2014-01-04 NOTE — Progress Notes (Signed)
Pre visit review using our clinic review tool, if applicable. No additional management support is needed unless otherwise documented below in the visit note.

## 2014-01-04 NOTE — Patient Instructions (Signed)
Take the klonopin as needed for nerves and anxiety

## 2014-01-04 NOTE — Progress Notes (Signed)
Subjective:    [name], female    DOB: June 10, 1951, 63 y.o.   MRN: [id number]  HPI Off the [place] and on inhailed Tyvasco Has not been on it long enough to judge Oxygen 4liters in the day and 2 at night Scleraderma Has a mild foot ulcer and has been on the zyvox for MRSA    Review of Systems  Constitutional: Positive for activity change and appetite change.  HENT: Positive for congestion, postnasal drip, rhinorrhea and sinus pressure.   Respiratory: Positive for cough. Negative for shortness of breath and wheezing.   Cardiovascular: Negative.   Gastrointestinal: Negative.   Musculoskeletal: Negative.   Skin: Negative.   Allergic/Immunologic: Negative.   Neurological: Positive for weakness.  Psychiatric/Behavioral: Negative.        Past Medical History  Diagnosis Date  . History of chicken pox   . Hypertension   . Sclerodermia generalized   . Non Hodgkin's lymphoma     Treated with chemo 2010, felt to be cured.  . Melanoma 1999  . Peripheral arterial occlusive disease   . Neuropathic foot ulcer   . DOE (dyspnea on exertion)   . PAH (pulmonary artery hypertension)   . Epistaxis   . Pulmonary hypertension     History   Social History  . Marital Status: Married    Spouse Name: N/A    Number of Children: 1  . Years of Education: N/A   Occupational History  . homemaker    Social History Main Topics  . Smoking status: Never Smoker   . Smokeless tobacco: Never Used  . Alcohol Use: Yes     Comment: Occasional  . Drug Use: No  . Sexual Activity: Not Currently   Other Topics Concern  . Not on file   Social History Narrative  . No narrative on file    Past Surgical History  Procedure Laterality Date  . Rt little toe removal  10/1998  . Stent left thigh  3/11  . Tonsillectomy and adenoidectomy    . Small intestine surgery    . Biopsy on cerebellum    . Vein ligation and stripping  05/16/2012    Procedure: VEIN LIGATION AND STRIPPING;   Surgeon: [name], MD;  Location: [facility] OR;  Service: Vascular;  Laterality: Right;  Irrigation and Debridement right lower leg, ligation of saphenous vein.  . Endovenous ablation saphenous vein w/ laser  07-24-2012    right greater saphenous vein by [name], M.D.   . Endovenous ablation saphenous vein w/ laser  10-16-2012    left greater saphenous vein  by Dr. [name]  . Apligraph  12-04-2102,   12-18-2012    bilateral calves   [name]  . Tunneled venous catheter placement  01/2013    Family History  Problem Relation Age of Onset  . Lupus Father   . Lymphoma Father   . Hyperlipidemia Mother   . Cancer Daughter     sarcoma  . Colon cancer      Allergies  Allergen Reactions  . Sulfa Antibiotics     Severe rash with significant peeling of face. No airway involvement per patient  . Levaquin [Levofloxacin In D5w] Other (See Comments)    Other reaction(s): Other (See Comments) FLU LIKE SYMPTOM  . Penicillins Rash    Current Outpatient Prescriptions on File Prior to Visit  Medication Sig Dispense Refill  . acidophilus (RISAQUAD) CAPS Take 1 capsule by mouth every morning.       Marland Kitchen  aspirin 81 MG tablet Take 81 mg by mouth every evening.       Marland Kitchen azelastine (ASTELIN) 137 MCG/SPRAY nasal spray Place 2 sprays into the nose 2 (two) times daily as needed for rhinitis. Use in each nostril as directed      . bosentan (TRACLEER) 125 MG tablet Take 125 mg by mouth 2 (two) times daily.      . cholecalciferol (VITAMIN D) 1000 UNITS tablet Take 2,000 Units by mouth every evening.       . clopidogrel (PLAVIX) 75 MG tablet take 1 tablet by mouth once daily  30 tablet  12  . fish oil-omega-3 fatty acids 1000 MG capsule Take 1 g by mouth daily with lunch.       . furosemide (LASIX) 80 MG tablet Take 1 tablet (80 mg total) by mouth 2 (two) times daily.  60 tablet  6  . gabapentin (NEURONTIN) 300 MG capsule Take 300 mg by mouth 2 (two) times daily. Take 1 at noon and 1 at bedtime       . mometasone (NASONEX) 50 MCG/ACT nasal spray Place 2 sprays into the nose daily as needed. 1-2 sprays once daily      . moxifloxacin (VIGAMOX) 0.5 % ophthalmic solution Place 1 drop into both eyes 3 (three) times daily.      . multivitamin (THERAGRAN) per tablet Take 2 tablets by mouth 2 (two) times daily.       . pantoprazole (PROTONIX) 40 MG tablet Take 1 tablet (40 mg total) by mouth daily.  30 tablet  3  . sildenafil (REVATIO) 20 MG tablet Take 40 mg by mouth 3 (three) times daily.      Marland Kitchen spironolactone (ALDACTONE) 25 MG tablet Take 25 mg by mouth every morning.      . Treprostinil (TYVASO) 0.6 MG/ML SOLN Inhale 18 mcg into the lungs 4 (four) times daily.  3.6 mL  6  . vitamin B-12 (CYANOCOBALAMIN) 500 MCG tablet Take 500 mcg by mouth every morning.        No current facility-administered medications on file prior to visit.    BP 140/82  Pulse 76  Temp(Src) 97.7 F (36.5 C)  Resp 20  Ht _0  (1.727 m)  Wt 126 lb (57.153 kg)  BMI 19.16 kg/m2    Objective:   Physical Exam  Nursing note and vitals reviewed. Constitutional: She appears well-nourished.  HENT:  Turbinates swollen  Eyes: EOM are normal.  Neck: Neck supple.  Cardiovascular: Normal rate and regular rhythm.   Pulmonary/Chest: Effort normal and breath sounds normal.  Abdominal: Soft. Bowel sounds are normal.  Musculoskeletal: She exhibits edema and tenderness.  Skin: Skin is dry.  Cyanotic extermities  Psychiatric: Her behavior is normal.          Assessment & Plan:  Had to stop the celexa due to qt prolongation on the zyvox  Mood depression noted off the celexa

## 2014-01-06 ENCOUNTER — Encounter: Payer: Self-pay | Admitting: Emergency Medicine

## 2014-01-06 ENCOUNTER — Other Ambulatory Visit (INDEPENDENT_AMBULATORY_CARE_PROVIDER_SITE_OTHER): Payer: BC Managed Care – PPO

## 2014-01-06 ENCOUNTER — Ambulatory Visit (INDEPENDENT_AMBULATORY_CARE_PROVIDER_SITE_OTHER): Payer: BC Managed Care – PPO | Admitting: Emergency Medicine

## 2014-01-06 VITALS — BP 120/76 | HR 89 | Ht 67.0 in | Wt 126.0 lb

## 2014-01-06 DIAGNOSIS — R69 Illness, unspecified: Secondary | ICD-10-CM

## 2014-01-06 DIAGNOSIS — I2789 Other specified pulmonary heart diseases: Secondary | ICD-10-CM

## 2014-01-06 DIAGNOSIS — L940 Localized scleroderma [morphea]: Secondary | ICD-10-CM

## 2014-01-06 DIAGNOSIS — I2729 Other secondary pulmonary hypertension: Secondary | ICD-10-CM

## 2014-01-06 LAB — HEPATIC FUNCTION PANEL
ALT: 18 U/L (ref 0–35)
AST: 30 U/L (ref 0–37)
Albumin: 4 g/dL (ref 3.5–5.2)
Alkaline Phosphatase: 92 U/L (ref 39–117)
Bilirubin, Direct: 0.1 mg/dL (ref 0.0–0.3)
Total Bilirubin: 0.7 mg/dL (ref 0.3–1.2)
Total Protein: 7.4 g/dL (ref 6.0–8.3)

## 2014-01-06 LAB — BASIC METABOLIC PANEL
BUN: 32 mg/dL — ABNORMAL HIGH (ref 6–23)
CO2: 32 mEq/L (ref 19–32)
Calcium: 9 mg/dL (ref 8.4–10.5)
Chloride: 94 mEq/L — ABNORMAL LOW (ref 96–112)
Creatinine, Ser: 1.2 mg/dL (ref 0.4–1.2)
GFR: 48.03 mL/min — ABNORMAL LOW (ref >60.00)
Glucose, Bld: 104 mg/dL — ABNORMAL HIGH (ref 70–99)
Potassium: 3.7 mEq/L (ref 3.5–5.1)
Sodium: 136 mEq/L (ref 135–145)

## 2014-01-06 NOTE — Assessment & Plan Note (Signed)
pred 5 - will consider a steroid sparing agent in the future but would like her to see Dr Amil Amen and get on a stable PAH regimen first

## 2014-01-06 NOTE — Patient Instructions (Signed)
Please continue your medications as you have been taking them.  Follow with Dr [name] as planned - you will probably need a repeat echocardiogram and 6 minute walk in several months to assess your status on the Tyvaso Continue Prednisone 28m daily Follow with Dr BLamonte Sakaiin 4 months or sooner if you have any problems.

## 2014-01-06 NOTE — Assessment & Plan Note (Signed)
Stable on Tyvaso, bosentan, sildenifil, O2 Repeat TTE and 6 min walk in a few months to assess fxn and exertional tolerance on the new regimen.

## 2014-01-06 NOTE — Progress Notes (Signed)
Subjective:    [name], female    DOB: 1951/04/21, 63 y.o.   MRN: [id number] HPI 63 yo woman, never smoker, hx of scleroderma, Raynaud's, non-Hodgkins lymphoma (treated '10), HTN, PAH originally dx in [place] and treated, but meds d/c'd when she improved. Has been seen in by Dr [name], had R heart cath as below in 03/2012. She has been restarted on therapy, seen both in [place] and at [facility] >> Tracleer + Revatio + Veletri (since 2/'14). She has felt worse on IV therapy, progressive as it titrated up.  Dr Gilles Chiquito scaled back her veletri for this reason. She has had progressive dyspnea - was just started prednisone 79m qd. She has benefited significantly, better exertional tolerance and SpO2.    Underwent RHC on 04/18/12 as below:  RA = 9  RV = 75/11/14  PA = 71/25 (44)  PCW = 4  Fick cardiac output/index = 3.0/1.7  Thermo CO/CI = 2.7/1.5  PVR = 23.5 Woods  FA sat = 79% on room air (checked 3 times). Sat with finger probe on air 94%  PA sat = 41%, 43%  MW 04/18/12 880 feet  ROV 06/24/13 -- f/u for scleroderma, Raynaud's, non-Hodgkins lymphoma (treated '10), HTN, PAH on IV therapy. 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 Aspirating).  She states that she has felt much better with good functional capacity, less dyspnea, no CP since she started prednisone. No overt aspiration sx although she is high risk. [facility]   ROV 07/24/13 -- scleroderma, Raynaud's, non-Hodgkins lymphoma (treated '10), HTN, PAH on IV therapy. Last time we initiated a pred taper, currently on 282m She has noticed a bit of lightheadedness. She saw Dr FoGilles Chiquitoarly July - her O2 was decreased to 4L/min w exertion. Her former maintenance pred dose was 54m57md.   ROV 08/27/13 -- scleroderma, Raynaud's, non-Hodgkins lymphoma (treated '10), HTN, PAH on IV therapy. We have been tapering pred, currently at 29m1md. She has been interested in getting off Veletri. We have planned to repeat R heart cath when we get to stable pred dosing. Breathing is stable. Continues to have occasional Reynaud's, not really increased freq.   ROV 10/08/13 -- scleroderma, Raynaud's, non-Hodgkins lymphoma (treated '10), HTN, PAH on IV therapy. We have been tapering pred, last time decreased to 54mg.33me appears to have tolerated this. No new issues with breathing, CP. Edema is stable. No syncope or pre-syncope.   ROV 01/06/14 -- scleroderma, Raynaud's, non-Hodgkins lymphoma (treated '10), HTN, PAH previously on IV therapy >> [facility]. Current dose is 6 puffs qid. Also on bosentan and sildenifil. She has also been treated for a MRSA R leg ulcer with linezolid, planned for 10 days. She had to stop celexa when the linezolid caused QTc prolongation. She is on Pred 54mg, 38mble scleroderma dose. She saw Dr BeekmaAmil Amenlanning to follow with him.   Breathing is doing well. Doesn't feel that she lost much ground since she changed from IV therapy. No real edema. No syncope or presyncope. No HA's or visual changes.    Review of Systems  Constitutional: Negative for fever, appetite change and unexpected weight change.  HENT: Negative for congestion, dental problem, ear pain, nosebleeds, postnasal drip, rhinorrhea, sinus pressure, sneezing, sore throat and trouble swallowing.   Eyes: Negative for redness  and itching.  Respiratory: Positive for shortness of breath. Negative for cough, chest tightness and wheezing.   Cardiovascular: Negative for palpitations and leg swelling.  Gastrointestinal: Negative for nausea and vomiting.  Genitourinary: Negative for dysuria.  Musculoskeletal: Negative for joint swelling.  Skin: Negative for rash.  Neurological: Negative for headaches.  Hematological: Does not bruise/bleed easily.  Psychiatric/Behavioral: Negative for dysphoric mood. The  patient is not nervous/anxious.        Objective:   Physical Exam Filed Vitals:   01/06/14 1436  BP: 120/76  Pulse: 89  Height: _0  (1.702 m)  Weight: 126 lb (57.153 kg)  SpO2: 94%   Gen: Pleasant, thin, in no distress,  normal affect on high flow O2  ENT: No lesions,  mouth clear,  oropharynx clear, no postnasal drip  Neck: No JVD, no TMG, no carotid bruits  Lungs: No use of accessory muscles, few bibasilar insp crackles  Cardiovascular: RRR, heart sounds normal, no murmur or gallops, no peripheral edema  Musculoskeletal: No deformities, no cyanosis or clubbing  Neuro: alert, non focal  Skin: significant changes from scleroderma on arms, hands, fingers      Assessment & Plan:  Scleroderma, circumscribed or localized pred 5 - will consider a steroid sparing agent in the future but would like her to see Dr Amil Amen and get on a stable PAH regimen first  Pulmonary hypertension associated with systemic disorder Stable on Tyvaso, bosentan, sildenifil, O2 Repeat TTE and 6 min walk in a few months to assess fxn and exertional tolerance on the new regimen.

## 2014-01-07 ENCOUNTER — Other Ambulatory Visit (HOSPITAL_COMMUNITY): Payer: Self-pay | Admitting: Adult Health

## 2014-01-07 ENCOUNTER — Encounter (HOSPITAL_BASED_OUTPATIENT_CLINIC_OR_DEPARTMENT_OTHER): Payer: BC Managed Care – PPO | Attending: Internal Medicine

## 2014-01-07 ENCOUNTER — Ambulatory Visit (HOSPITAL_COMMUNITY)
Admission: RE | Admit: 2014-01-07 | Discharge: 2014-01-07 | Disposition: A | Discharge status: Home / Self Care | Payer: BC Managed Care – PPO | Source: Ambulatory Visit | Attending: Internal Medicine | Admitting: Internal Medicine

## 2014-01-07 VITALS — BP 124/58 | HR 72 | Resp 20 | Wt 125.5 lb

## 2014-01-07 DIAGNOSIS — I743 Embolism and thrombosis of arteries of the lower extremities: Secondary | ICD-10-CM | POA: Insufficient documentation

## 2014-01-07 DIAGNOSIS — L940 Localized scleroderma [morphea]: Secondary | ICD-10-CM

## 2014-01-07 DIAGNOSIS — L97909 Non-pressure chronic ulcer of unspecified part of unspecified lower leg with unspecified severity: Principal | ICD-10-CM

## 2014-01-07 DIAGNOSIS — Z7982 Long term (current) use of aspirin: Secondary | ICD-10-CM | POA: Insufficient documentation

## 2014-01-07 DIAGNOSIS — I272 Pulmonary hypertension, unspecified: Secondary | ICD-10-CM

## 2014-01-07 DIAGNOSIS — I2789 Other specified pulmonary heart diseases: Secondary | ICD-10-CM | POA: Insufficient documentation

## 2014-01-07 DIAGNOSIS — M109 Gout, unspecified: Secondary | ICD-10-CM | POA: Insufficient documentation

## 2014-01-07 DIAGNOSIS — Z79899 Other long term (current) drug therapy: Secondary | ICD-10-CM | POA: Insufficient documentation

## 2014-01-07 DIAGNOSIS — Z87898 Personal history of other specified conditions: Secondary | ICD-10-CM | POA: Insufficient documentation

## 2014-01-07 DIAGNOSIS — IMO0002 Reserved for concepts with insufficient information to code with codable children: Secondary | ICD-10-CM | POA: Insufficient documentation

## 2014-01-07 DIAGNOSIS — I10 Essential (primary) hypertension: Secondary | ICD-10-CM | POA: Insufficient documentation

## 2014-01-07 DIAGNOSIS — R230 Cyanosis: Secondary | ICD-10-CM | POA: Insufficient documentation

## 2014-01-07 DIAGNOSIS — I87339 Chronic venous hypertension (idiopathic) with ulcer and inflammation of unspecified lower extremity: Secondary | ICD-10-CM | POA: Insufficient documentation

## 2014-01-07 NOTE — Progress Notes (Signed)
[name], female   DOB: Feb 03, 1951, 63 y.o.   MRN: [id number] Primary Care: Dr. Benay Pillow Primary Cardiologist: Dr. [name] Pulmonologist: Dr. Gwenette Greet  HPI: [name] [name] is a 63 y.o. female with history of scleroderma dx'd in 1968, [place] of the brain treated with chem which is in remission since 2010, pulmonary hypertension diagnosed in 2007.  With regard to her scleroderma. Was following [facility] but rheumatologist left. Prednisone was stopped several years ago and has not been anything for her scleroderma since that time.  Diagnosed with PAH in 2007 in [place]. She did have a right heart catheterization by her history for verification. The patient has been treated with cellcept, tracleer and revatio for approximately one to 2 years, and then tells me the medications were stopped in [place] because the echo "looked good".  I do not have her old records available for review. Apparently she was followed very closely with serial echocardiograms that remained fairly normal, and therefore she was taken off medications. Echo in 2011 as a f/u for her chemotherapy. LV EF 65-70%. RV severely enlarged/HK severe TR. pRVSP 115-120. She saw Dr. Gwenette Greet and referred here for [place]. No evidence of pulmonary fibrosis on CXR.   RHC 04/18/12  RA = 9  RV = 75/11/14  PA = 71/25 (44)  PCW = 4  Fick cardiac output/index = 3.0/1.7  Thermo CO/CI = 2.7/1.5  PVR = 13.3 Woods  FA sat = 79% on room air (checked 3 times). Sat with finger probe on air 94%  PA sat = 41%, 43%   6 MW 04/18/12 880 feet 6 MW 11/10/12 650 feet. Limited by legs. Sats down to 85% on 5L (sats at rest 95%)   Echo 10/13 LV EF 65-70%. RV severely enlarged/HK severe TR. pRVSP 40mHg  Repeat RHC 11/14/12  RA = 14  RV = 84/8/20  PA = 83/29 (50)  PCW = 10  Fick cardiac output/index = 4.5/2.5  Thermo CO/CI = 3.8/2.2  PVR = 8.9 Woods (Fick)  FA sat = 86% on 3L  PA sat = 55%, 57   [facility]iquitoand started on IV  epoprostenol (Veletri) in 01/2013. Also on bosentan 10 and Revatio 40 tid.    [place], prednisone, bosentan and sildenafil.   [facility] ve[facility]ri wean and tyvaso initiation.   She returns for post hospital follow up. She was intolerant of 12 breaths of Tyvaso and able to tolerate 6 breaths 4 times a day. Overall she is feeling ok. Says she feels good. Doesn't notice any improvement or worsening. Denies SOB/PND/Orthopnea. Denies presyncope. Remains on 4 liters continuous oxygen during the day and 2 liters continuous at night. Able to perform additional ADLs without dyspnea. Weight at home 122 pounds. Waiting to hear from Dr. BMelissa Noonoffice to see about re-initiating Cellcept which she says made her feel better in past. Continues to [facility].   Review of Systems: All pertinent positives and negatives as in HPI, otherwise negative.   Past Medical History  Diagnosis Date  . History of chicken pox   . Hypertension   . Sclerodermia generalized   . Non Hodgkin's lymphoma     Treated with chemo 2010, felt to be cured.  . Melanoma 1999  . Peripheral arterial occlusive disease   . Neuropathic foot ulcer   . DOE (dyspnea on exertion)   .  PAH (pulmonary artery hypertension)   . Epistaxis   . Pulmonary hypertension     Current Outpatient Prescriptions  Medication Sig Dispense Refill  . acidophilus (RISAQUAD) CAPS Take 1 capsule by mouth every morning.       Marland Kitchen aspirin 81 MG tablet Take 81 mg by mouth every evening.       Marland Kitchen azelastine (ASTELIN) 137 MCG/SPRAY nasal spray Place 2 sprays into the nose 2 (two) times daily as needed for rhinitis. Use in each nostril as directed      . bosentan (TRACLEER) 125 MG tablet Take 125 mg by mouth 2 (two) times daily.      . cholecalciferol (VITAMIN D) 1000 UNITS tablet Take 2,000 Units by mouth every evening.       . clonazePAM (KLONOPIN) 0.5 MG tablet Take 0.5  tablets (0.25 mg total) by mouth 2 (two) times daily as needed for anxiety.  30 tablet  2  . clopidogrel (PLAVIX) 75 MG tablet take 1 tablet by mouth once daily  30 tablet  12  . fish oil-omega-3 fatty acids 1000 MG capsule Take 1 g by mouth daily with lunch.       . furosemide (LASIX) 80 MG tablet Take 1 tablet (80 mg total) by mouth 2 (two) times daily.  60 tablet  6  . gabapentin (NEURONTIN) 300 MG capsule Take 300 mg by mouth 2 (two) times daily. Take 1 at noon and 1 at bedtime      . Linezolid (ZYVOX PO) Take 500 mg by mouth 2 (two) times daily.      . mometasone (NASONEX) 50 MCG/ACT nasal spray Place 2 sprays into the nose daily as needed. 1-2 sprays once daily      . moxifloxacin (VIGAMOX) 0.5 % ophthalmic solution Place 1 drop into both eyes 3 (three) times daily.      . multivitamin (THERAGRAN) per tablet Take 2 tablets by mouth 2 (two) times daily.       . pantoprazole (PROTONIX) 40 MG tablet Take 1 tablet (40 mg total) by mouth daily.  30 tablet  3  . predniSONE (DELTASONE) 5 MG tablet Take 5 mg by mouth daily with breakfast.      . sildenafil (REVATIO) 20 MG tablet Take 40 mg by mouth 3 (three) times daily.      Marland Kitchen spironolactone (ALDACTONE) 25 MG tablet Take 25 mg by mouth every morning.      . Treprostinil (TYVASO) 0.6 MG/ML SOLN Inhale 18 mcg into the lungs 4 (four) times daily.  3.6 mL  6  . vitamin B-12 (CYANOCOBALAMIN) 500 MCG tablet Take 500 mcg by mouth every morning.        No current facility-administered medications for this encounter.    Allergies  Allergen Reactions  . Sulfa Antibiotics     Severe rash with significant peeling of face. No airway involvement per patient  . Levaquin [Levofloxacin In D5w] Other (See Comments)    Other reaction(s): Other (See Comments) FLU LIKE SYMPTOM  . Penicillins Rash     Filed Vitals:   01/07/14 1439  BP: 124/58  Pulse: 72  Resp: 20  Weight: 125 lb 8 oz (56.926 kg)  SpO2: 96%    PHYSICAL EXAM: General: No acute  distress No respiratory difficulty HEENT: normal Neck: supple. JVP 7-8. Carotids 2+ bilat; no bruits. No lymphadenopathy or thryomegaly appreciated. Cor: PMI nondisplaced. Regular rate & rhythm. Prominent P2. Soft TR murmur. + RV lift Lungs: clear on 4 liters [place] Abdomen:  soft, nontender, nondistended. No hepatosplenomegaly. No bruits or masses. Good bowel sounds. 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 + gouty nodules. LEs wrapped due to ulcers.  Minimal edema Neuro: alert & oriented x 3, cranial nerves grossly intact. moves all 4 extremities w/o difficulty. Affect pleasant.  ASSESSMENT & PLAN:  1. PAH - Continue  macitentan and sildenafil. On Tyvaso able to tolerate 6 breaths 4 times a day.  At next OV will need 6MW and ECHO.  2. Scleroderma- Pending referral to Dr Amil Amen  3. Chronic RLE wound - on Zyvox  4. H/o NHL  Follow up in 1 month   CLEGG,AMY NP-C  2:56 PM  Patient seen and examined with Darrick Grinder, NP. We discussed all aspects of the encounter. I agree with the assessment and plan as stated above. Seems stable on triple therapy. Will bring back in 2 months and 6MW. Have contacted Dr. Melissa Noon office directly to try and have her seen in the near future sow e can make decision about what to do with prednisone or restarting Cellcept.  Benay Spice 3:38 PM

## 2014-01-07 NOTE — Patient Instructions (Signed)
Follow up in 2 months...will need ECHO with 6 minute walk at this time, we will call you!

## 2014-01-07 NOTE — Addendum Note (Signed)
Encounter addended by: [name], RN on: 01/07/2014  3:45 PM<BR>     Documentation filed: Patient Instructions Section

## 2014-01-22 ENCOUNTER — Ambulatory Visit (HOSPITAL_COMMUNITY): Admission: RE | Admit: 2014-01-22 | Payer: BC Managed Care – PPO | Source: Ambulatory Visit

## 2014-01-27 ENCOUNTER — Ambulatory Visit (HOSPITAL_COMMUNITY)
Admission: RE | Admit: 2014-01-27 | Discharge: 2014-01-27 | Disposition: A | Discharge status: Home / Self Care | Payer: BC Managed Care – PPO | Source: Ambulatory Visit | Attending: Internal Medicine | Admitting: Internal Medicine

## 2014-01-27 DIAGNOSIS — C8589 Other specified types of non-Hodgkin lymphoma, extranodal and solid organ sites: Secondary | ICD-10-CM

## 2014-01-27 DIAGNOSIS — C8339 Primary central nervous system lymphoma: Secondary | ICD-10-CM

## 2014-01-27 DIAGNOSIS — Z87898 Personal history of other specified conditions: Secondary | ICD-10-CM | POA: Insufficient documentation

## 2014-01-27 DIAGNOSIS — G319 Degenerative disease of nervous system, unspecified: Secondary | ICD-10-CM | POA: Insufficient documentation

## 2014-01-27 DIAGNOSIS — Z09 Encounter for follow-up examination after completed treatment for conditions other than malignant neoplasm: Secondary | ICD-10-CM | POA: Insufficient documentation

## 2014-01-27 MED ORDER — GADOBENATE DIMEGLUMINE 529 MG/ML IV SOLN
15.0000 mL | Freq: Once | INTRAVENOUS | Status: AC | PRN
  Administered 2014-01-27: 11 mL via INTRAVENOUS

## 2014-02-02 ENCOUNTER — Telehealth: Payer: Self-pay | Admitting: Internal Medicine

## 2014-02-02 NOTE — Telephone Encounter (Signed)
Pt would like your permission to go back on citalopram (CELEXA) 20 MG tablet  Pt has stopped taking clonazePAM (KLONOPIN) 0.5 MG tablet, has not taken in several days. Is that ok w/ you? Pt will let you know if she needs any refills.

## 2014-02-03 ENCOUNTER — Other Ambulatory Visit (HOSPITAL_COMMUNITY): Payer: Self-pay | Admitting: Internal Medicine

## 2014-02-03 DIAGNOSIS — C8589 Other specified types of non-Hodgkin lymphoma, extranodal and solid organ sites: Secondary | ICD-10-CM

## 2014-02-03 DIAGNOSIS — C8339 Primary central nervous system lymphoma: Secondary | ICD-10-CM

## 2014-02-04 ENCOUNTER — Encounter (HOSPITAL_BASED_OUTPATIENT_CLINIC_OR_DEPARTMENT_OTHER): Payer: BC Managed Care – PPO | Attending: Internal Medicine

## 2014-02-04 DIAGNOSIS — L97909 Non-pressure chronic ulcer of unspecified part of unspecified lower leg with unspecified severity: Principal | ICD-10-CM

## 2014-02-04 DIAGNOSIS — I87339 Chronic venous hypertension (idiopathic) with ulcer and inflammation of unspecified lower extremity: Secondary | ICD-10-CM | POA: Insufficient documentation

## 2014-02-05 NOTE — Telephone Encounter (Signed)
Ok to take celexa and pt informed

## 2014-02-05 NOTE — Telephone Encounter (Signed)
May resume

## 2014-02-14 ENCOUNTER — Other Ambulatory Visit: Payer: Self-pay | Admitting: Internal Medicine

## 2014-02-18 ENCOUNTER — Other Ambulatory Visit (INDEPENDENT_AMBULATORY_CARE_PROVIDER_SITE_OTHER): Payer: BC Managed Care – PPO

## 2014-02-18 DIAGNOSIS — I2789 Other specified pulmonary heart diseases: Secondary | ICD-10-CM

## 2014-02-18 DIAGNOSIS — R69 Illness, unspecified: Secondary | ICD-10-CM

## 2014-02-18 DIAGNOSIS — I2729 Other secondary pulmonary hypertension: Secondary | ICD-10-CM

## 2014-02-18 DIAGNOSIS — L940 Localized scleroderma [morphea]: Secondary | ICD-10-CM

## 2014-02-18 LAB — BASIC METABOLIC PANEL
BUN: 33 mg/dL — ABNORMAL HIGH (ref 6–23)
CO2: 32 mEq/L (ref 19–32)
Calcium: 9.4 mg/dL (ref 8.4–10.5)
Chloride: 95 mEq/L — ABNORMAL LOW (ref 96–112)
Creatinine, Ser: 1 mg/dL (ref 0.4–1.2)
GFR: 57.27 mL/min — ABNORMAL LOW (ref >60.00)
Glucose, Bld: 101 mg/dL — ABNORMAL HIGH (ref 70–99)
Potassium: 3.9 mEq/L (ref 3.5–5.1)
Sodium: 136 mEq/L (ref 135–145)

## 2014-02-18 LAB — HEPATIC FUNCTION PANEL
ALT: 21 U/L (ref 0–35)
AST: 33 U/L (ref 0–37)
Albumin: 4.1 g/dL (ref 3.5–5.2)
Alkaline Phosphatase: 68 U/L (ref 39–117)
Bilirubin, Direct: 0 mg/dL (ref 0.0–0.3)
Total Bilirubin: 0.8 mg/dL (ref 0.3–1.2)
Total Protein: 7.5 g/dL (ref 6.0–8.3)

## 2014-02-22 ENCOUNTER — Other Ambulatory Visit: Payer: Self-pay | Admitting: Emergency Medicine

## 2014-02-26 ENCOUNTER — Encounter: Payer: Self-pay | Admitting: Internal Medicine

## 2014-03-02 ENCOUNTER — Ambulatory Visit (INDEPENDENT_AMBULATORY_CARE_PROVIDER_SITE_OTHER): Payer: BC Managed Care – PPO | Admitting: Family Medicine

## 2014-03-02 VITALS — BP 120/70 | Temp 98.7°F | Wt 128.0 lb

## 2014-03-02 DIAGNOSIS — J329 Chronic sinusitis, unspecified: Secondary | ICD-10-CM

## 2014-03-02 MED ORDER — DOXYCYCLINE HYCLATE 100 MG PO TABS: 100.0000 mg | ORAL_TABLET | Freq: Two times a day (BID) | ORAL | Status: DC

## 2014-03-02 NOTE — Progress Notes (Signed)
Pre visit review using our clinic review tool, if applicable. No additional management support is needed unless otherwise documented below in the visit note.

## 2014-03-02 NOTE — Progress Notes (Signed)
Chief Complaint  Patient presents with  . Sore Throat    cough with drainage, face sore     HPI:  -started: 3 days ago -symptoms:nasal congestion, sore throat, cough, drainage, R maxillary sinus pain -denies:fever, SOB, NVD, tooth pain -has tried: tylenol -sick contacts/travel/risks: denies flu exposure or Ebola risks -Hx of: pulm hypertension on O2 and prednisone chronically, scleroderma  ROS: See pertinent positives and negatives per HPI.  Past Medical History  Diagnosis Date  . History of chicken pox   . Hypertension   . Sclerodermia generalized   . Non Hodgkin's lymphoma     Treated with chemo 2010, felt to be cured.  . Melanoma 1999  . Peripheral arterial occlusive disease   . Neuropathic foot ulcer   . DOE (dyspnea on exertion)   . PAH (pulmonary artery hypertension)   . Epistaxis   . Pulmonary hypertension     Past Surgical History  Procedure Laterality Date  . Rt little toe removal  10/1998  . Stent left thigh  3/11  . Tonsillectomy and adenoidectomy    . Small intestine surgery    . Biopsy on cerebellum    . Vein ligation and stripping  05/16/2012    Procedure: VEIN LIGATION AND STRIPPING;  Surgeon: [name], MD;  Location: [facility] OR;  Service: Vascular;  Laterality: Right;  Irrigation and Debridement right lower leg, ligation of saphenous vein.  . Endovenous ablation saphenous vein w/ laser  07-24-2012    right greater saphenous vein by [name], M.D.   . Endovenous ablation saphenous vein w/ laser  10-16-2012    left greater saphenous vein  by Dr. [name]  . Apligraph  12-04-2102,   12-18-2012    bilateral calves   [name]  . Tunneled venous catheter placement  01/2013    Family History  Problem Relation Age of Onset  . Lupus Father   . Lymphoma Father   . Hyperlipidemia Mother   . Cancer Daughter     sarcoma  . Colon cancer      History   Social History  . Marital Status: Married    Spouse Name: N/A    Number of  Children: 1  . Years of Education: N/A   Occupational History  . homemaker    Social History Main Topics  . Smoking status: Never Smoker   . Smokeless tobacco: Never Used  . Alcohol Use: Yes     Comment: Occasional  . Drug Use: No  . Sexual Activity: Not Currently   Other Topics Concern  . Not on file   Social History Narrative  . No narrative on file    Current outpatient prescriptions:acidophilus (RISAQUAD) CAPS, Take 1 capsule by mouth every morning. , Disp: , Rfl: ;  aspirin 81 MG tablet, Take 81 mg by mouth every evening. , Disp: , Rfl: ;  azelastine (ASTELIN) 137 MCG/SPRAY nasal spray, Place 2 sprays into the nose 2 (two) times daily as needed for rhinitis. Use in each nostril as directed, Disp: , Rfl:  bosentan (TRACLEER) 125 MG tablet, Take 125 mg by mouth 2 (two) times daily., Disp: , Rfl: ;  cholecalciferol (VITAMIN D) 1000 UNITS tablet, Take 2,000 Units by mouth every evening. , Disp: , Rfl: ;  citalopram (CELEXA) 20 MG tablet, take 1 tablet by mouth once daily, Disp: 30 tablet, Rfl: 11;  clonazePAM (KLONOPIN) 0.5 MG tablet, Take 0.5 tablets (0.25 mg total) by mouth 2 (two) times daily as needed  for anxiety., Disp: 30 tablet, Rfl: 2 clopidogrel (PLAVIX) 75 MG tablet, take 1 tablet by mouth once daily, Disp: 30 tablet, Rfl: 12;  fish oil-omega-3 fatty acids 1000 MG capsule, Take 1 g by mouth daily with lunch. , Disp: , Rfl: ;  furosemide (LASIX) 80 MG tablet, Take 1 tablet (80 mg total) by mouth 2 (two) times daily., Disp: 60 tablet, Rfl: 6;  gabapentin (NEURONTIN) 300 MG capsule, Take 300 mg by mouth 2 (two) times daily. Take 1 at noon and 1 at bedtime, Disp: , Rfl:  Linezolid (ZYVOX PO), Take 500 mg by mouth 2 (two) times daily., Disp: , Rfl: ;  mometasone (NASONEX) 50 MCG/ACT nasal spray, Place 2 sprays into the nose daily as needed. 1-2 sprays once daily, Disp: , Rfl: ;  moxifloxacin (VIGAMOX) 0.5 % ophthalmic solution, Place 1 drop into both eyes 3 (three) times daily., Disp: ,  Rfl: ;  multivitamin (THERAGRAN) per tablet, Take 2 tablets by mouth 2 (two) times daily. , Disp: , Rfl:  pantoprazole (PROTONIX) 40 MG tablet, Take 1 tablet (40 mg total) by mouth daily., Disp: 30 tablet, Rfl: 3;  predniSONE (DELTASONE) 5 MG tablet, Take 5 mg by mouth daily with breakfast., Disp: , Rfl: ;  predniSONE (DELTASONE) 5 MG tablet, take 1 tablet by mouth once daily, Disp: 30 tablet, Rfl: 5;  sildenafil (REVATIO) 20 MG tablet, Take 40 mg by mouth 3 (three) times daily., Disp: , Rfl:  spironolactone (ALDACTONE) 25 MG tablet, Take 25 mg by mouth every morning., Disp: , Rfl: ;  Treprostinil (TYVASO) 0.6 MG/ML SOLN, Inhale 18 mcg into the lungs 4 (four) times daily., Disp: 3.6 mL, Rfl: 6;  vitamin B-12 (CYANOCOBALAMIN) 500 MCG tablet, Take 500 mcg by mouth every morning. , Disp: , Rfl: ;  doxycycline (VIBRA-TABS) 100 MG tablet, Take 1 tablet (100 mg total) by mouth 2 (two) times daily., Disp: 20 tablet, Rfl: 0  EXAM:  Filed Vitals:   03/02/14 1308  BP: 120/70  Temp: 98.7 F (37.1 C)    Body mass index is 20.04 kg/(m^2).  GENERAL: vitals reviewed and listed above, alert, oriented, appears well hydrated and in no acute distress  HEENT: atraumatic, conjunttiva clear, no obvious abnormalities on inspection of external nose and ears, normal appearance of ear canals and TMs, thick nasal congestion, mild post oropharyngeal erythema with PND, no tonsillar edema or exudate, R max sinus TTP  NECK: no obvious masses on inspection  LUNGS: clear to auscultation bilaterally, no wheezes, rales or rhonchi, good air movement  CV: HRRR, no peripheral edema  MS: moves all extremities without noticeable abnormality  PSYCH: pleasant and cooperative, no obvious depression or anxiety  ASSESSMENT AND PLAN:  Discussed the following assessment and plan:  Sinusitis - Plan: doxycycline (VIBRA-TABS) 100 MG tablet  - We discussed potential etiologies, viral versus bacterial rhinosinusitis likely -We  discussed treatment side effects, likely course, antibiotic misuse, transmission, and signs of developing a serious illness. -She opted to start and antibiotic after discussion risks/benefits given other health issues and sinus pain -of course, we advised to return or notify a doctor immediately if symptoms worsen or persist or new concerns arise.    There are no Patient Instructions on file for this visit.   Colin Benton R.

## 2014-03-03 ENCOUNTER — Encounter (HOSPITAL_COMMUNITY): Payer: Self-pay | Admitting: Emergency Medicine

## 2014-03-03 ENCOUNTER — Telehealth: Payer: Self-pay | Admitting: Internal Medicine

## 2014-03-03 ENCOUNTER — Emergency Department (HOSPITAL_COMMUNITY)
Admission: EM | Admit: 2014-03-03 | Discharge: 2014-03-03 | Disposition: A | Discharge status: Home / Self Care | Payer: BC Managed Care – PPO | Source: Home / Self Care | Attending: Family Medicine | Admitting: Family Medicine

## 2014-03-03 DIAGNOSIS — I10 Essential (primary) hypertension: Secondary | ICD-10-CM

## 2014-03-03 DIAGNOSIS — T50995A Adverse effect of other drugs, medicaments and biological substances, initial encounter: Secondary | ICD-10-CM

## 2014-03-03 DIAGNOSIS — T7840XA Allergy, unspecified, initial encounter: Secondary | ICD-10-CM

## 2014-03-03 MED ORDER — PREDNISONE 20 MG PO TABS
20.0000 mg | ORAL_TABLET | Freq: Once | ORAL | Status: AC
  Administered 2014-03-03: 20 mg via ORAL

## 2014-03-03 MED ORDER — PREDNISONE 20 MG PO TABS
ORAL_TABLET | ORAL | Status: AC
  Filled 2014-03-03: qty 1

## 2014-03-03 MED ORDER — DIPHENHYDRAMINE HCL 50 MG/ML IJ SOLN
INTRAMUSCULAR | Status: AC
  Filled 2014-03-03: qty 1

## 2014-03-03 MED ORDER — PREDNISONE 10 MG PO TABS: 20.0000 mg | ORAL_TABLET | Freq: Every day | ORAL | Status: DC

## 2014-03-03 MED ORDER — DIPHENHYDRAMINE HCL 25 MG PO CAPS: 25.0000 mg | ORAL_CAPSULE | Freq: Three times a day (TID) | ORAL | Status: DC

## 2014-03-03 MED ORDER — FAMOTIDINE 20 MG PO TABS: 20.0000 mg | ORAL_TABLET | Freq: Two times a day (BID) | ORAL | Status: DC

## 2014-03-03 MED ORDER — FAMOTIDINE 20 MG PO TABS: 20.0000 mg | ORAL_TABLET | Freq: Once | ORAL | Status: DC

## 2014-03-03 MED ORDER — DIPHENHYDRAMINE HCL 50 MG/ML IJ SOLN
25.0000 mg | Freq: Once | INTRAMUSCULAR | Status: AC
  Administered 2014-03-03: 25 mg via INTRAMUSCULAR

## 2014-03-03 MED ORDER — DIPHENHYDRAMINE HCL 25 MG PO CAPS: 25.0000 mg | ORAL_CAPSULE | Freq: Two times a day (BID) | ORAL | Status: DC

## 2014-03-03 NOTE — Discharge Instructions (Signed)
I would advise that you discontinue taking the doxycycline and discuss with your provider if he/she would like to start you on a different medication instead. Please use medications as prescribed here and if symptoms become worse or you develop difficulty breathing, speaking or swallowing, seek medication attention at your nearest ER.

## 2014-03-03 NOTE — ED Notes (Signed)
Pt  Has  A  History      Of  scleraderma         She  Reports  Seen yest  For      Samuel [place]  And  Was  Placed  On  Doxy         Although   She  Has  Never   Had  Problems  Wit  Doxy  Before       She     Reports  Some  Tenderness  In face  And glands  Of  Her  Neck      She  Initially  Had     sorethroat  But  That  Is  Better

## 2014-03-03 NOTE — ED Provider Notes (Signed)
CSN: [id number]     Arrival date & time 03/03/14  1226 History   First MD Initiated Contact with Patient 03/03/14 1403     Chief Complaint  Patient presents with  . Facial Swelling   (Consider location/radiation/quality/duration/timing/severity/associated sxs/prior Treatment) HPI Comments: Patient reports she was seen by her PCP provider yesterday [facility] [place]) for nasal congestion, post nasal drainage and throat irritation. She was prescribed oral doxycycline which she began taking yesterday and over the last 24 hours she has noticed swelling of her lower cheeks, periorbital tissues and redness on her forehead. Denies any swelling of lips or tongue. Denies pain or any difficulty breathing, speaking or swelling. Denies any additional skin rashes, such as hives. Denies fever or pruritis.  Reports soreness of her throat has since resolved.   The history is provided by the patient.    Past Medical History  Diagnosis Date  . History of chicken pox   . Hypertension   . Sclerodermia generalized   . Non Hodgkin's lymphoma     Treated with chemo 2010, felt to be cured.  . Melanoma 1999  . Peripheral arterial occlusive disease   . Neuropathic foot ulcer   . DOE (dyspnea on exertion)   . PAH (pulmonary artery hypertension)   . Epistaxis   . Pulmonary hypertension    Past Surgical History  Procedure Laterality Date  . Rt little toe removal  10/1998  . Stent left thigh  3/11  . Tonsillectomy and adenoidectomy    . Small intestine surgery    . Biopsy on cerebellum    . Vein ligation and stripping  05/16/2012    Procedure: VEIN LIGATION AND STRIPPING;  Surgeon: [name], MD;  Location: [facility] OR;  Service: Vascular;  Laterality: Right;  Irrigation and Debridement right lower leg, ligation of saphenous vein.  . Endovenous ablation saphenous vein w/ laser  07-24-2012    right greater saphenous vein by [name], M.D.   . Endovenous ablation saphenous vein w/ laser  10-16-2012   left greater saphenous vein  by Dr. [name]  . Apligraph  12-04-2102,   12-18-2012    bilateral calves   [name]  . Tunneled venous catheter placement  01/2013   Family History  Problem Relation Age of Onset  . Lupus Father   . Lymphoma Father   . Hyperlipidemia Mother   . Cancer Daughter     sarcoma  . Colon cancer     History  Substance Use Topics  . Smoking status: Never Smoker   . Smokeless tobacco: Never Used  . Alcohol Use: Yes     Comment: Occasional   OB History   Grav Para Term Preterm Abortions TAB SAB Ect Mult Living                 Review of Systems  All other systems reviewed and are negative.    Allergies  Sulfa antibiotics; Levaquin; and Penicillins  Home Medications   Current Outpatient Rx  Name  Route  Sig  Dispense  Refill  . acidophilus (RISAQUAD) CAPS   Oral   Take 1 capsule by mouth every morning.          Marland Kitchen aspirin 81 MG tablet   Oral   Take 81 mg by mouth every evening.          Marland Kitchen azelastine (ASTELIN) 137 MCG/SPRAY nasal spray   Nasal   Place 2 sprays into the nose 2 (two)  times daily as needed for rhinitis. Use in each nostril as directed         . bosentan (TRACLEER) 125 MG tablet   Oral   Take 125 mg by mouth 2 (two) times daily.         . cholecalciferol (VITAMIN D) 1000 UNITS tablet   Oral   Take 2,000 Units by mouth every evening.          . citalopram (CELEXA) 20 MG tablet      take 1 tablet by mouth once daily   30 tablet   11   . clonazePAM (KLONOPIN) 0.5 MG tablet   Oral   Take 0.5 tablets (0.25 mg total) by mouth 2 (two) times daily as needed for anxiety.   30 tablet   2   . clopidogrel (PLAVIX) 75 MG tablet      take 1 tablet by mouth once daily   30 tablet   12   . diphenhydrAMINE (BENADRYL) 25 mg capsule   Oral   Take 1 capsule (25 mg total) by mouth every 12 (twelve) hours. X 3 days   6 capsule   0   . doxycycline (VIBRA-TABS) 100 MG tablet   Oral   Take 1 tablet (100  mg total) by mouth 2 (two) times daily.   20 tablet   0   . famotidine (PEPCID) 20 MG tablet   Oral   Take 1 tablet (20 mg total) by mouth 2 (two) times daily. X 4 days   8 tablet   0   . fish oil-omega-3 fatty acids 1000 MG capsule   Oral   Take 1 g by mouth daily with lunch.          . furosemide (LASIX) 80 MG tablet   Oral   Take 1 tablet (80 mg total) by mouth 2 (two) times daily.   60 tablet   6   . gabapentin (NEURONTIN) 300 MG capsule   Oral   Take 300 mg by mouth 2 (two) times daily. Take 1 at noon and 1 at bedtime         . Linezolid (ZYVOX PO)   Oral   Take 500 mg by mouth 2 (two) times daily.         . mometasone (NASONEX) 50 MCG/ACT nasal spray   Nasal   Place 2 sprays into the nose daily as needed. 1-2 sprays once daily         . moxifloxacin (VIGAMOX) 0.5 % ophthalmic solution   Both Eyes   Place 1 drop into both eyes 3 (three) times daily.         . multivitamin (THERAGRAN) per tablet   Oral   Take 2 tablets by mouth 2 (two) times daily.          . pantoprazole (PROTONIX) 40 MG tablet   Oral   Take 1 tablet (40 mg total) by mouth daily.   30 tablet   3   . predniSONE (DELTASONE) 10 MG tablet   Oral   Take 2 tablets (20 mg total) by mouth daily. X 3 days. Begin 03/04/2014   6 tablet   0   . predniSONE (DELTASONE) 5 MG tablet   Oral   Take 5 mg by mouth daily with breakfast.         . predniSONE (DELTASONE) 5 MG tablet      take 1 tablet by mouth once daily   30 tablet   5   .  sildenafil (REVATIO) 20 MG tablet   Oral   Take 40 mg by mouth 3 (three) times daily.         Marland Kitchen spironolactone (ALDACTONE) 25 MG tablet   Oral   Take 25 mg by mouth every morning.         . Treprostinil (TYVASO) 0.6 MG/ML SOLN   Inhalation   Inhale 18 mcg into the lungs 4 (four) times daily.   3.6 mL   6   . vitamin B-12 (CYANOCOBALAMIN) 500 MCG tablet   Oral   Take 500 mcg by mouth every morning.           BP 138/67  Pulse 89   Temp(Src) 98.9 F (37.2 C) (Oral)  Resp 16  SpO2 % Physical Exam  Nursing note and vitals reviewed. Constitutional: She is oriented to person, place, and time. She appears well-developed and well-nourished. No distress.  HENT:  Head: Normocephalic and atraumatic.  Right Ear: Hearing and external ear normal.  Left Ear: Hearing and external ear normal.  Nose: Nose normal.  Mouth/Throat: Uvula is midline, oropharynx is clear and moist and mucous membranes are normal.  Eyes: Conjunctivae and EOM are normal. Right eye exhibits no discharge and no exudate. Left eye exhibits no discharge and no exudate. Right conjunctiva is not injected. Right conjunctiva has no hemorrhage. Left conjunctiva is not injected. Left conjunctiva has no hemorrhage. No scleral icterus.  +moderate periorbital edema without tenderness and with mild erythema  Neck: Normal range of motion. Neck supple. No thyromegaly present.    Areas outlines are areas of mild soft tissue fullness without induration, erythema or tenderness.  Cardiovascular: Normal rate, regular rhythm and normal heart sounds.   Pulmonary/Chest: Effort normal and breath sounds normal.  Musculoskeletal: Normal range of motion.  Lymphadenopathy:    She has no cervical adenopathy.  Neurological: She is alert and oriented to person, place, and time.  Skin: Skin is warm and dry.  Psychiatric: She has a normal mood and affect. Her behavior is normal.    ED Course  Procedures (including critical care time) Labs Review Labs Reviewed - No data to display Imaging Review No results found.   MDM   1. Allergic reaction caused by a drug    Allergic response: advised patient to discontinue use of doxycycline and discuss with her PCP if he/she wishes to start another agent instead. Provided benadryl and prednisone at [facility] and will provide Rx for 3 days prednisone, pepcid and benadryl at home. Cautioned patient should she develop additional swelling or any  difficulty with speaking, breathing or swallowing, she should either dial 911 or seek medical attention at her nearest ER.    [employer], [place] 03/03/14 [phone]

## 2014-03-03 NOTE — Telephone Encounter (Signed)
Patient Information:  Caller Name: Stacie  Phone: [phone]  Patient: [name] [name], [name] [name]  Gender: Female  DOB: 1951/09/12  Age: 63 Years  PCP: Benay Pillow (Adults only)  Office Follow Up:  Does the office need to follow up with this patient?: No  Instructions For The Office: N/A  RN Note:  Patient calling with facial swelling entire face.  Per office, no appointment available.  Patient advised to go to [facility].  Directions and phone number given.    Symptoms  Reason For Call & Symptoms: facial swelling & under chin.  Reviewed Health History In EMR: Yes  Reviewed Medications In EMR: Yes  Reviewed Allergies In EMR: Yes  Reviewed Surgeries / Procedures: Yes  Date of Onset of Symptoms: 03/03/2014  Guideline(s) Used:  Face Swelling  Disposition Per Guideline:   Go to Office Now  Reason For Disposition Reached:   SEVERE swelling of the entire face  Advice Given:  N/A  Patient Will Follow Care Advice:  YES

## 2014-03-03 NOTE — Telephone Encounter (Signed)
Noted

## 2014-03-04 ENCOUNTER — Encounter (HOSPITAL_BASED_OUTPATIENT_CLINIC_OR_DEPARTMENT_OTHER): Payer: BC Managed Care – PPO | Attending: Internal Medicine

## 2014-03-04 DIAGNOSIS — L97909 Non-pressure chronic ulcer of unspecified part of unspecified lower leg with unspecified severity: Principal | ICD-10-CM

## 2014-03-04 DIAGNOSIS — I87339 Chronic venous hypertension (idiopathic) with ulcer and inflammation of unspecified lower extremity: Secondary | ICD-10-CM | POA: Insufficient documentation

## 2014-03-04 NOTE — ED Provider Notes (Signed)
Medical screening examination/treatment/procedure(s) were performed by a resident physician or non-physician practitioner and as the supervising physician I was immediately available for consultation/collaboration.  Lynne Leader, MD    Gregor Hams, MD 03/04/14 [phone]

## 2014-03-11 ENCOUNTER — Other Ambulatory Visit: Payer: Self-pay | Admitting: Internal Medicine

## 2014-03-23 ENCOUNTER — Other Ambulatory Visit (INDEPENDENT_AMBULATORY_CARE_PROVIDER_SITE_OTHER): Payer: BC Managed Care – PPO

## 2014-03-23 DIAGNOSIS — I2789 Other specified pulmonary heart diseases: Secondary | ICD-10-CM

## 2014-03-23 DIAGNOSIS — R69 Illness, unspecified: Secondary | ICD-10-CM

## 2014-03-23 DIAGNOSIS — L940 Localized scleroderma [morphea]: Secondary | ICD-10-CM

## 2014-03-23 DIAGNOSIS — I2729 Other secondary pulmonary hypertension: Secondary | ICD-10-CM

## 2014-03-23 LAB — HEPATIC FUNCTION PANEL
ALT: 16 U/L (ref 0–35)
AST: 28 U/L (ref 0–37)
Albumin: 4 g/dL (ref 3.5–5.2)
Alkaline Phosphatase: 74 U/L (ref 39–117)
Bilirubin, Direct: 0.1 mg/dL (ref 0.0–0.3)
Total Bilirubin: 0.9 mg/dL (ref 0.3–1.2)
Total Protein: 7.2 g/dL (ref 6.0–8.3)

## 2014-03-23 LAB — BASIC METABOLIC PANEL
BUN: 31 mg/dL — ABNORMAL HIGH (ref 6–23)
CO2: 34 mEq/L — ABNORMAL HIGH (ref 19–32)
Calcium: 9.3 mg/dL (ref 8.4–10.5)
Chloride: 95 mEq/L — ABNORMAL LOW (ref 96–112)
Creatinine, Ser: 1 mg/dL (ref 0.4–1.2)
GFR: 56.61 mL/min — ABNORMAL LOW (ref >60.00)
Glucose, Bld: 91 mg/dL (ref 70–99)
Potassium: 3.9 mEq/L (ref 3.5–5.1)
Sodium: 137 mEq/L (ref 135–145)

## 2014-03-28 ENCOUNTER — Emergency Department (HOSPITAL_BASED_OUTPATIENT_CLINIC_OR_DEPARTMENT_OTHER)
Admission: EM | Admit: 2014-03-28 | Discharge: 2014-03-28 | Disposition: A | Discharge status: Home / Self Care | Payer: BC Managed Care – PPO | Attending: Emergency Medicine | Admitting: Emergency Medicine

## 2014-03-28 DIAGNOSIS — Z792 Long term (current) use of antibiotics: Secondary | ICD-10-CM | POA: Insufficient documentation

## 2014-03-28 DIAGNOSIS — Y929 Unspecified place or not applicable: Secondary | ICD-10-CM | POA: Insufficient documentation

## 2014-03-28 DIAGNOSIS — I10 Essential (primary) hypertension: Secondary | ICD-10-CM | POA: Insufficient documentation

## 2014-03-28 DIAGNOSIS — S81812A Laceration without foreign body, left lower leg, initial encounter: Secondary | ICD-10-CM

## 2014-03-28 DIAGNOSIS — S91009A Unspecified open wound, unspecified ankle, initial encounter: Principal | ICD-10-CM

## 2014-03-28 DIAGNOSIS — Z872 Personal history of diseases of the skin and subcutaneous tissue: Secondary | ICD-10-CM | POA: Insufficient documentation

## 2014-03-28 DIAGNOSIS — I2789 Other specified pulmonary heart diseases: Secondary | ICD-10-CM | POA: Insufficient documentation

## 2014-03-28 DIAGNOSIS — S81009A Unspecified open wound, unspecified knee, initial encounter: Secondary | ICD-10-CM | POA: Insufficient documentation

## 2014-03-28 DIAGNOSIS — S81809A Unspecified open wound, unspecified lower leg, initial encounter: Principal | ICD-10-CM

## 2014-03-28 DIAGNOSIS — Z8619 Personal history of other infectious and parasitic diseases: Secondary | ICD-10-CM | POA: Insufficient documentation

## 2014-03-28 DIAGNOSIS — IMO0002 Reserved for concepts with insufficient information to code with codable children: Secondary | ICD-10-CM | POA: Insufficient documentation

## 2014-03-28 DIAGNOSIS — Z7982 Long term (current) use of aspirin: Secondary | ICD-10-CM | POA: Insufficient documentation

## 2014-03-28 DIAGNOSIS — Z7902 Long term (current) use of antithrombotics/antiplatelets: Secondary | ICD-10-CM | POA: Insufficient documentation

## 2014-03-28 DIAGNOSIS — Z79899 Other long term (current) drug therapy: Secondary | ICD-10-CM | POA: Insufficient documentation

## 2014-03-28 DIAGNOSIS — Y9389 Activity, other specified: Secondary | ICD-10-CM | POA: Insufficient documentation

## 2014-03-28 DIAGNOSIS — Z85820 Personal history of malignant melanoma of skin: Secondary | ICD-10-CM | POA: Insufficient documentation

## 2014-03-28 DIAGNOSIS — Z880 Allergy status to penicillin: Secondary | ICD-10-CM | POA: Insufficient documentation

## 2014-03-28 MED ORDER — SILVER NITRATE-POT NITRATE 75-25 % EX MISC
CUTANEOUS | Status: AC
  Filled 2014-03-28: qty 1

## 2014-03-28 NOTE — ED Notes (Signed)
Laceration to left leg.  Approximately 2 cm.  Bleeding controlled.

## 2014-03-28 NOTE — ED Provider Notes (Signed)
CSN: [id number]     Arrival date & time 03/28/14  1847 History   First MD Initiated Contact with Patient 03/28/14 1905     Chief Complaint  Patient presents with  . Extremity Laceration     (Consider location/radiation/quality/duration/timing/severity/associated sxs/prior Treatment) Patient is a 63 y.o. female presenting with skin laceration. The history is provided by the patient.  Laceration Location:  Leg Leg laceration location:  L lower leg Length (cm):  2 Depth:  Cutaneous Quality: avulsion   Bleeding: controlled   Time since incident:  1 hour Pain details:    Quality:  Aching   Severity:  Mild Foreign body present:  No foreign bodies Relieved by:  Pressure Tetanus status:  Up to date  [name] [name] is a 63 y.o. female who presents to the ED with a laceration to the lateral aspect of the left lower leg. She was getting out of the car and the corner of the door hit her leg. She had on pants and support hose. It did not tear her pants. She is up to date on tetanus. She denies any other injuries or problems today. She does take 81 mg ASA and Plavix daily.   Past Medical History  Diagnosis Date  . History of chicken pox   . Hypertension   . Sclerodermia generalized   . Non Hodgkin's lymphoma     Treated with chemo 2010, felt to be cured.  . Melanoma 1999  . Peripheral arterial occlusive disease   . Neuropathic foot ulcer   . DOE (dyspnea on exertion)   . PAH (pulmonary artery hypertension)   . Epistaxis   . Pulmonary hypertension    Past Surgical History  Procedure Laterality Date  . Rt little toe removal  10/1998  . Stent left thigh  3/11  . Tonsillectomy and adenoidectomy    . Small intestine surgery    . Biopsy on cerebellum    . Vein ligation and stripping  05/16/2012    Procedure: VEIN LIGATION AND STRIPPING;  Surgeon: [name], MD;  Location: [facility] OR;  Service: Vascular;  Laterality: Right;  Irrigation and Debridement right lower leg, ligation of saphenous  vein.  . Endovenous ablation saphenous vein w/ laser  07-24-2012    right greater saphenous vein by [name], M.D.   . Endovenous ablation saphenous vein w/ laser  10-16-2012    left greater saphenous vein  by Dr. [name]  . Apligraph  12-04-2102,   12-18-2012    bilateral calves   [name]  . Tunneled venous catheter placement  01/2013   Family History  Problem Relation Age of Onset  . Lupus Father   . Lymphoma Father   . Hyperlipidemia Mother   . Cancer Daughter     sarcoma  . Colon cancer     History  Substance Use Topics  . Smoking status: Never Smoker   . Smokeless tobacco: Never Used  . Alcohol Use: Yes     Comment: Occasional   OB History   Grav Para Term Preterm Abortions TAB SAB Ect Mult Living                 Review of Systems Negative except as stated in HPI   Allergies  Sulfa antibiotics; Levaquin; and Penicillins  Home Medications   Current Outpatient Rx  Name  Route  Sig  Dispense  Refill  . acidophilus (RISAQUAD) CAPS   Oral   Take 1 capsule by mouth every  morning.          Marland Kitchen aspirin 81 MG tablet   Oral   Take 81 mg by mouth every evening.          Marland Kitchen azelastine (ASTELIN) 137 MCG/SPRAY nasal spray   Nasal   Place 2 sprays into the nose 2 (two) times daily as needed for rhinitis. Use in each nostril as directed         . bosentan (TRACLEER) 125 MG tablet   Oral   Take 125 mg by mouth 2 (two) times daily.         . cholecalciferol (VITAMIN D) 1000 UNITS tablet   Oral   Take 2,000 Units by mouth every evening.          . citalopram (CELEXA) 20 MG tablet      take 1 tablet by mouth once daily   30 tablet   11   . clonazePAM (KLONOPIN) 0.5 MG tablet   Oral   Take 0.5 tablets (0.25 mg total) by mouth 2 (two) times daily as needed for anxiety.   30 tablet   2   . clopidogrel (PLAVIX) 75 MG tablet      take 1 tablet by mouth once daily   30 tablet   12   . diphenhydrAMINE (BENADRYL) 25 mg capsule    Oral   Take 1 capsule (25 mg total) by mouth every 12 (twelve) hours. X 3 days   6 capsule   0   . doxycycline (VIBRA-TABS) 100 MG tablet   Oral   Take 1 tablet (100 mg total) by mouth 2 (two) times daily.   20 tablet   0   . famotidine (PEPCID) 20 MG tablet   Oral   Take 1 tablet (20 mg total) by mouth 2 (two) times daily. X 4 days   8 tablet   0   . fish oil-omega-3 fatty acids 1000 MG capsule   Oral   Take 1 g by mouth daily with lunch.          . furosemide (LASIX) 80 MG tablet   Oral   Take 1 tablet (80 mg total) by mouth 2 (two) times daily.   60 tablet   6   . gabapentin (NEURONTIN) 300 MG capsule   Oral   Take 300 mg by mouth 2 (two) times daily. Take 1 at noon and 1 at bedtime         . Linezolid (ZYVOX PO)   Oral   Take 500 mg by mouth 2 (two) times daily.         . mometasone (NASONEX) 50 MCG/ACT nasal spray   Nasal   Place 2 sprays into the nose daily as needed. 1-2 sprays once daily         . moxifloxacin (VIGAMOX) 0.5 % ophthalmic solution   Both Eyes   Place 1 drop into both eyes 3 (three) times daily.         . multivitamin (THERAGRAN) per tablet   Oral   Take 2 tablets by mouth 2 (two) times daily.          . pantoprazole (PROTONIX) 40 MG tablet      take 1 tablet by mouth once daily   30 tablet   3   . predniSONE (DELTASONE) 10 MG tablet   Oral   Take 2 tablets (20 mg total) by mouth daily. X 3 days. Begin 03/04/2014   6 tablet   0   .  predniSONE (DELTASONE) 5 MG tablet   Oral   Take 5 mg by mouth daily with breakfast.         . predniSONE (DELTASONE) 5 MG tablet      take 1 tablet by mouth once daily   30 tablet   5   . sildenafil (REVATIO) 20 MG tablet   Oral   Take 40 mg by mouth 3 (three) times daily.         Marland Kitchen spironolactone (ALDACTONE) 25 MG tablet   Oral   Take 25 mg by mouth every morning.         . Treprostinil (TYVASO) 0.6 MG/ML SOLN   Inhalation   Inhale 18 mcg into the lungs 4 (four) times  daily.   3.6 mL   6   . vitamin B-12 (CYANOCOBALAMIN) 500 MCG tablet   Oral   Take 500 mcg by mouth every morning.           BP 175/65  Pulse 65  Temp(Src) 98.2 F (36.8 C) (Oral)  Resp 16  SpO2 91% Physical Exam  Nursing note and vitals reviewed. Constitutional: She is oriented to person, place, and time. She appears well-developed and well-nourished. No distress.  HENT:  Head: Normocephalic and atraumatic.  Eyes: EOM are normal.  Neck: Neck supple.  Cardiovascular: Normal rate.   Pulmonary/Chest: Effort normal.  Abdominal: Soft. There is no tenderness.  Musculoskeletal: Normal range of motion.       Left lower leg: She exhibits laceration.       Legs: Neurological: She is alert and oriented to person, place, and time. No cranial nerve deficit.  Skin: Skin is warm and dry.  Psychiatric: She has a normal mood and affect. Her behavior is normal.    ED Course  Procedures  Wound cleaned with NSS, one area of bleeding cauterized with silver nitrite stick. Wound closed with Dermabond. Pressure dressing applied.  MDM  63 y.o. female with superficial laceration to the left lower leg s/p injury. She will follow up as needed for any problems.  Discussed with the patient and all questioned fully answered. No bleeding from site on discharge.    [facility] Bunnie Pion, [place] 03/28/14 2007

## 2014-03-28 NOTE — ED Notes (Signed)
Hemostasis was achieved, however patient and husband requested additional DermaBond to secure the wound.  Delano Metz, FNP agrees.  A thin layer of DermaBond was applied and dried prior to discharge.

## 2014-03-30 ENCOUNTER — Encounter (HOSPITAL_COMMUNITY): Payer: Self-pay

## 2014-03-30 ENCOUNTER — Ambulatory Visit (HOSPITAL_COMMUNITY)
Admission: RE | Admit: 2014-03-30 | Discharge: 2014-03-30 | Disposition: A | Discharge status: Home / Self Care | Payer: BC Managed Care – PPO | Source: Ambulatory Visit | Attending: Internal Medicine | Admitting: Internal Medicine

## 2014-03-30 ENCOUNTER — Ambulatory Visit (HOSPITAL_BASED_OUTPATIENT_CLINIC_OR_DEPARTMENT_OTHER)
Admission: RE | Admit: 2014-03-30 | Discharge: 2014-03-30 | Disposition: A | Discharge status: Home / Self Care | Payer: BC Managed Care – PPO | Source: Ambulatory Visit | Attending: Internal Medicine | Admitting: Internal Medicine

## 2014-03-30 VITALS — BP 142/68 | Wt 127.8 lb

## 2014-03-30 DIAGNOSIS — I739 Peripheral vascular disease, unspecified: Secondary | ICD-10-CM | POA: Diagnosis not present

## 2014-03-30 DIAGNOSIS — I2789 Other specified pulmonary heart diseases: Secondary | ICD-10-CM | POA: Insufficient documentation

## 2014-03-30 DIAGNOSIS — I779 Disorder of arteries and arterioles, unspecified: Secondary | ICD-10-CM

## 2014-03-30 DIAGNOSIS — I272 Pulmonary hypertension, unspecified: Secondary | ICD-10-CM

## 2014-03-30 DIAGNOSIS — E785 Hyperlipidemia, unspecified: Secondary | ICD-10-CM

## 2014-03-30 DIAGNOSIS — I743 Embolism and thrombosis of arteries of the lower extremities: Secondary | ICD-10-CM

## 2014-03-30 DIAGNOSIS — I2729 Other secondary pulmonary hypertension: Secondary | ICD-10-CM

## 2014-03-30 DIAGNOSIS — M349 Systemic sclerosis, unspecified: Secondary | ICD-10-CM | POA: Insufficient documentation

## 2014-03-30 DIAGNOSIS — I369 Nonrheumatic tricuspid valve disorder, unspecified: Secondary | ICD-10-CM

## 2014-03-30 DIAGNOSIS — L940 Localized scleroderma [morphea]: Secondary | ICD-10-CM

## 2014-03-30 DIAGNOSIS — R69 Illness, unspecified: Secondary | ICD-10-CM

## 2014-03-30 DIAGNOSIS — I10 Essential (primary) hypertension: Secondary | ICD-10-CM

## 2014-03-30 NOTE — Progress Notes (Signed)
6 min walk test completed, pt ambulated 915 ft (279 m) pt tolerated well O2 sat ranged from 93-97% on oxygen and HR ranged 88-99

## 2014-03-30 NOTE — Patient Instructions (Signed)
Increase Sildenafil to 60 mg (3 tabs) Three times a day   Your physician has requested that you have an ankle brachial index (ABI). During this test an ultrasound and blood pressure cuff are used to evaluate the arteries that supply the arms and legs with blood. Allow thirty minutes for this exam. There are no restrictions or special instructions.

## 2014-03-31 NOTE — Progress Notes (Signed)
Patient ID: Anjela Cassara, female   DOB: 10-Mar-1951, 63 y.o.   MRN: [id number] Primary Care: Dr. Benay Pillow Primary Cardiologist: Dr. [name] Pulmonologist: Dr. Gwenette Greet  HPI: Ms. Ledesma is a 63 y.o. 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  With regard to her scleroderma. Was following at [facility] but rheumatologist left. Prednisone was stopped several years ago and has not been anything for her scleroderma since that time.  Diagnosed with PAH in 2007 in [place]. She did have a right heart catheterization by her history for verification. The patient has been treated with cellcept, tracleer and revatio for approximately one to 2 years, and then tells me the medications were stopped in [place] because the echo "looked good".  I do not have her old records available for review. Apparently she was followed very closely with serial echocardiograms that remained fairly normal, and therefore she was taken off medications. Echo in 2011 as a f/u for her chemotherapy. LV EF 65-70%. RV severely enlarged/HK severe TR. pRVSP 115-120. She saw Dr. Gwenette Greet and referred here for [place]. No evidence of pulmonary fibrosis on CXR.   RHC 04/18/12  RA = 9  RV = 75/11/14  PA = 71/25 (44)  PCW = 4  Fick cardiac output/index = 3.0/1.7  Thermo CO/CI = 2.7/1.5  PVR = 13.3 Woods  FA sat = 79% on room air (checked 3 times). Sat with finger probe on air 94%  PA sat = 41%, 43%   6 MW 04/18/12 880 feet 6 MW 11/10/12 650 feet. Limited by legs. Sats down to 85% on 5L (sats at rest 95%)   Echo 10/13 LV EF 65-70%. RV severely enlarged/HK severe TR. RVSP 57mHg  Repeat RHC 11/14/12  RA = 14  RV = 84/8/20  PA = 83/29 (50)  PCW = 10  Fick cardiac output/index = 4.5/2.5  Thermo CO/CI = 3.8/2.2  PVR = 8.9 Woods (Fick)  FA sat = 86% on 3L  PA sat = 55%, 57  [facility] started on IV  epoprostenol (Veletri) in 01/2013. Also on Bosentan and Revatio 40 tid.  She had intolerable side effects with Veletri.  [facility] for Veletri wean and Tyvaso initiation.  She was intolerant of 12 breaths of Tyvaso but able to tolerate 6 breaths 4 times a day.   Echo (3/15) with EF 60-65%, mildly D-shaped septum, RV with mild to moderate dilation and mildly decreased systolic function, mild TR, PA systolic pressure 62 mmHg, IVC not dilated.   Overall stable.  Remains on 4 liters continuous oxygen during the day and 2 liters continuous at night. Able to perform additional ADLs without dyspnea. Able to take 15+ minute walks, no dyspnea walking on flat ground.  Weight stable.  No orthopnea/PND.  She has been seen by rheumatology, will be returning soon to discuss treatment options for her scleroderma. She continues to be seen at the wound center for ulcer on right calf.   6 MW 3/15 915 feet (279 m)  Labs (3/15): K 3.9, creatinine 1.0, LFTs normal  Review of Systems: All pertinent positives and negatives as in HPI, otherwise negative.   Past Medical History  Diagnosis Date  . History of chicken pox   . Hypertension   . Sclerodermia generalized   . Non Hodgkin's lymphoma     Treated with chemo 2010,  felt to be cured.  . Melanoma 1999  . Peripheral arterial occlusive disease   . Neuropathic foot ulcer   . DOE (dyspnea on exertion)   . PAH (pulmonary artery hypertension)   . Epistaxis   . Pulmonary hypertension     Current Outpatient Prescriptions  Medication Sig Dispense Refill  . acidophilus (RISAQUAD) CAPS Take 1 capsule by mouth every morning.       Marland Kitchen aspirin 81 MG tablet Take 81 mg by mouth every evening.       . bosentan (TRACLEER) 125 MG tablet Take 125 mg by mouth 2 (two) times daily.      . cholecalciferol (VITAMIN D) 1000 UNITS tablet Take 2,000 Units by mouth every evening.       . citalopram (CELEXA) 20 MG tablet take 1 tablet by mouth once daily  30  tablet  11  . clopidogrel (PLAVIX) 75 MG tablet take 1 tablet by mouth once daily  30 tablet  12  . diphenhydrAMINE (BENADRYL) 25 mg capsule Take 1 capsule (25 mg total) by mouth every 12 (twelve) hours. X 3 days  6 capsule  0  . fish oil-omega-3 fatty acids 1000 MG capsule Take 1 g by mouth daily with lunch.       . furosemide (LASIX) 80 MG tablet Take 1 tablet (80 mg total) by mouth 2 (two) times daily.  60 tablet  6  . gabapentin (NEURONTIN) 300 MG capsule Take 300 mg by mouth 2 (two) times daily. Take 1 at noon and 1 at bedtime      . moxifloxacin (VIGAMOX) 0.5 % ophthalmic solution Place 1 drop into both eyes 3 (three) times daily.      . multivitamin (THERAGRAN) per tablet Take 2 tablets by mouth 2 (two) times daily.       . pantoprazole (PROTONIX) 40 MG tablet take 1 tablet by mouth once daily  30 tablet  3  . predniSONE (DELTASONE) 5 MG tablet take 1 tablet by mouth once daily  30 tablet  5  . sildenafil (REVATIO) 20 MG tablet Take 40 mg by mouth 3 (three) times daily.      Marland Kitchen spironolactone (ALDACTONE) 25 MG tablet Take 25 mg by mouth every morning.      . Treprostinil (TYVASO) 0.6 MG/ML SOLN Inhale 18 mcg into the lungs 4 (four) times daily.  3.6 mL  6  . vitamin B-12 (CYANOCOBALAMIN) 500 MCG tablet Take 500 mcg by mouth every morning.        No current facility-administered medications for this encounter.    Allergies  Allergen Reactions  . Sulfa Antibiotics     Severe rash with significant peeling of face. No airway involvement per patient  . Levaquin [Levofloxacin In D5w] Other (See Comments)    Other reaction(s): Other (See Comments) FLU LIKE SYMPTOM  . Penicillins Rash     Filed Vitals:   03/30/14 1149  BP: 142/68  Weight: 127 lb 12.8 oz (57.97 kg)    PHYSICAL EXAM: General: No acute distress No respiratory difficulty HEENT: normal Neck: supple. JVP 7. Carotids 2+ bilat; no bruits. No lymphadenopathy or thryomegaly appreciated. Cor: PMI nondisplaced. Regular rate  & rhythm. Prominent P2. Soft TR murmur. + RV lift Lungs: clear on 4 liters [place] Abdomen: soft, nontender, nondistended. No hepatosplenomegaly. No bruits or masses. Good bowel sounds. 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 + gouty nodules. LEs wrapped due to ulcers.  No edema Neuro: alert & oriented x 3,  cranial nerves grossly intact. moves all 4 extremities w/o difficulty. Affect pleasant.  ASSESSMENT & PLAN:  1. PAH: Group one, related to scleroderma.  Unable to tolerate IV epoprostenol and now on triple therapy with bosentan, sildenafil, and Tyvaso.  Unable to tolerate Tyvaso at 12 breaths/day. 6 minute walk today is improved but still not ideal.  I reviewed today's echo.  RV actually seems to be better than on the prior echo and TR is less. WHO class II-III dyspnea.  - Increase sildenafil to 60 mg tid - Continue standing monthly LFTs given bosentan use.  - Continue current Tyvaso dose.  2. Scleroderma: Being evaluated by Dr. Amil Amen.  3. Chronic right calf ulceration: Patient has history of PAD, no recent evaluation. I will arrange for ABIs.  4. PAD: As above, plan ABIs.  She is on Plavix.    Benjamim Harnish,MD 03/31/2014

## 2014-03-31 NOTE — ED Provider Notes (Signed)
Medical screening examination/treatment/procedure(s) were performed by non-physician practitioner and as supervising physician I was immediately available for consultation/collaboration.   EKG Interpretation None        Orpah Greek, MD 03/31/14 [phone]

## 2014-03-31 NOTE — Addendum Note (Signed)
Encounter addended by: Ladoris Gene, CCT on: 03/31/2014  9:48 AM<BR>     Documentation filed: Charges VN

## 2014-04-01 ENCOUNTER — Other Ambulatory Visit (HOSPITAL_COMMUNITY): Payer: Self-pay | Admitting: Internal Medicine

## 2014-04-01 ENCOUNTER — Encounter (HOSPITAL_BASED_OUTPATIENT_CLINIC_OR_DEPARTMENT_OTHER): Payer: BC Managed Care – PPO | Attending: Internal Medicine

## 2014-04-01 ENCOUNTER — Other Ambulatory Visit (HOSPITAL_COMMUNITY): Payer: Self-pay | Admitting: Cardiology

## 2014-04-01 DIAGNOSIS — L97909 Non-pressure chronic ulcer of unspecified part of unspecified lower leg with unspecified severity: Principal | ICD-10-CM

## 2014-04-01 DIAGNOSIS — IMO0002 Reserved for concepts with insufficient information to code with codable children: Secondary | ICD-10-CM

## 2014-04-01 DIAGNOSIS — L97809 Non-pressure chronic ulcer of other part of unspecified lower leg with unspecified severity: Secondary | ICD-10-CM | POA: Insufficient documentation

## 2014-04-01 DIAGNOSIS — I739 Peripheral vascular disease, unspecified: Secondary | ICD-10-CM

## 2014-04-01 DIAGNOSIS — I87339 Chronic venous hypertension (idiopathic) with ulcer and inflammation of unspecified lower extremity: Secondary | ICD-10-CM | POA: Insufficient documentation

## 2014-04-01 DIAGNOSIS — T148XXA Other injury of unspecified body region, initial encounter: Secondary | ICD-10-CM

## 2014-04-01 MED ORDER — SILDENAFIL CITRATE 20 MG PO TABS: 60.0000 mg | ORAL_TABLET | Freq: Three times a day (TID) | ORAL | Status: DC

## 2014-04-01 NOTE — Addendum Note (Signed)
Encounter addended by: Scarlette Calico, RN on: 04/01/2014 10:00 AM<BR>     Documentation filed: Orders

## 2014-04-02 ENCOUNTER — Encounter (HOSPITAL_COMMUNITY): Payer: Self-pay

## 2014-04-02 ENCOUNTER — Ambulatory Visit (HOSPITAL_COMMUNITY)
Admission: RE | Admit: 2014-04-02 | Discharge: 2014-04-02 | Disposition: A | Discharge status: Home / Self Care | Payer: BC Managed Care – PPO | Source: Ambulatory Visit | Attending: Internal Medicine | Admitting: Internal Medicine

## 2014-04-02 DIAGNOSIS — T148XXA Other injury of unspecified body region, initial encounter: Secondary | ICD-10-CM

## 2014-04-02 DIAGNOSIS — L03039 Cellulitis of unspecified toe: Principal | ICD-10-CM | POA: Insufficient documentation

## 2014-04-02 DIAGNOSIS — L02619 Cutaneous abscess of unspecified foot: Secondary | ICD-10-CM | POA: Insufficient documentation

## 2014-04-02 LAB — CREATININE, SERUM
Creatinine, Ser: 0.99 mg/dL (ref 0.50–1.10)
GFR calc Af Amer: 68 mL/min — ABNORMAL LOW (ref >90)
GFR calc non Af Amer: 59 mL/min — ABNORMAL LOW (ref >90)

## 2014-04-02 MED ORDER — GADOBENATE DIMEGLUMINE 529 MG/ML IV SOLN
10.0000 mL | Freq: Once | INTRAVENOUS | Status: AC | PRN
  Administered 2014-04-02: 10 mL via INTRAVENOUS

## 2014-04-05 ENCOUNTER — Ambulatory Visit (HOSPITAL_COMMUNITY): Payer: BC Managed Care – PPO | Attending: Cardiology | Admitting: Cardiology

## 2014-04-05 DIAGNOSIS — L97509 Non-pressure chronic ulcer of other part of unspecified foot with unspecified severity: Secondary | ICD-10-CM | POA: Insufficient documentation

## 2014-04-05 DIAGNOSIS — IMO0002 Reserved for concepts with insufficient information to code with codable children: Secondary | ICD-10-CM

## 2014-04-05 DIAGNOSIS — I739 Peripheral vascular disease, unspecified: Secondary | ICD-10-CM | POA: Insufficient documentation

## 2014-04-05 DIAGNOSIS — I779 Disorder of arteries and arterioles, unspecified: Secondary | ICD-10-CM

## 2014-04-05 NOTE — Progress Notes (Signed)
Lower Arterial Doppler/ABI performed

## 2014-04-08 ENCOUNTER — Other Ambulatory Visit (HOSPITAL_COMMUNITY): Payer: Self-pay

## 2014-04-08 MED ORDER — BOSENTAN 125 MG PO TABS: 125.0000 mg | ORAL_TABLET | Freq: Two times a day (BID) | ORAL | Status: DC

## 2014-04-20 ENCOUNTER — Other Ambulatory Visit (INDEPENDENT_AMBULATORY_CARE_PROVIDER_SITE_OTHER): Payer: BC Managed Care – PPO

## 2014-04-20 DIAGNOSIS — I2789 Other specified pulmonary heart diseases: Secondary | ICD-10-CM

## 2014-04-20 DIAGNOSIS — R69 Illness, unspecified: Secondary | ICD-10-CM

## 2014-04-20 DIAGNOSIS — I2729 Other secondary pulmonary hypertension: Secondary | ICD-10-CM

## 2014-04-20 DIAGNOSIS — L940 Localized scleroderma [morphea]: Secondary | ICD-10-CM

## 2014-04-20 LAB — HEPATIC FUNCTION PANEL
ALT: 16 U/L (ref 0–35)
ALT: 16 U/L (ref 0–35)
AST: 28 U/L (ref 0–37)
AST: 30 U/L (ref 0–37)
Albumin: 3.9 g/dL (ref 3.5–5.2)
Albumin: 3.9 g/dL (ref 3.5–5.2)
Alkaline Phosphatase: 68 U/L (ref 39–117)
Alkaline Phosphatase: 70 U/L (ref 39–117)
Bilirubin, Direct: 0 mg/dL (ref 0.0–0.3)
Bilirubin, Direct: 0 mg/dL (ref 0.0–0.3)
Total Bilirubin: 0.8 mg/dL (ref 0.3–1.2)
Total Bilirubin: 1 mg/dL (ref 0.3–1.2)
Total Protein: 7.3 g/dL (ref 6.0–8.3)
Total Protein: 7.4 g/dL (ref 6.0–8.3)

## 2014-04-20 LAB — BASIC METABOLIC PANEL
BUN: 30 mg/dL — ABNORMAL HIGH (ref 6–23)
CO2: 32 mEq/L (ref 19–32)
Calcium: 9.5 mg/dL (ref 8.4–10.5)
Chloride: 95 mEq/L — ABNORMAL LOW (ref 96–112)
Creatinine, Ser: 0.9 mg/dL (ref 0.4–1.2)
GFR: 63.6 mL/min (ref >60.00)
Glucose, Bld: 99 mg/dL (ref 70–99)
Potassium: 4.2 mEq/L (ref 3.5–5.1)
Sodium: 139 mEq/L (ref 135–145)

## 2014-04-26 ENCOUNTER — Other Ambulatory Visit: Payer: Self-pay | Admitting: Internal Medicine

## 2014-05-06 ENCOUNTER — Encounter (HOSPITAL_BASED_OUTPATIENT_CLINIC_OR_DEPARTMENT_OTHER): Payer: BC Managed Care – PPO | Attending: Internal Medicine

## 2014-05-06 DIAGNOSIS — L97909 Non-pressure chronic ulcer of unspecified part of unspecified lower leg with unspecified severity: Principal | ICD-10-CM

## 2014-05-06 DIAGNOSIS — I87339 Chronic venous hypertension (idiopathic) with ulcer and inflammation of unspecified lower extremity: Secondary | ICD-10-CM | POA: Insufficient documentation

## 2014-05-11 ENCOUNTER — Telehealth (HOSPITAL_COMMUNITY): Payer: Self-pay | Admitting: *Deleted

## 2014-05-11 MED ORDER — METOLAZONE 2.5 MG PO TABS: 2.5000 mg | ORAL_TABLET | ORAL | Status: DC | PRN

## 2014-05-11 NOTE — Telephone Encounter (Signed)
Pt called to report gradual wt gain since OV 3/31, she reports wt is up 5 lbs, hasn't noticed much edema but states abd is "puffy" denies SOB, she is on Lasix 80 mg Twice daily, discussed w/Dr Bensimhon, he would like pt to take Metolazone 2.5 mg today and tomorrow if needed then take as needed, pt aware and agreeable, rx sent in

## 2014-05-13 ENCOUNTER — Other Ambulatory Visit (HOSPITAL_COMMUNITY): Payer: Self-pay | Admitting: *Deleted

## 2014-05-13 DIAGNOSIS — I10 Essential (primary) hypertension: Secondary | ICD-10-CM

## 2014-05-13 MED ORDER — FUROSEMIDE 80 MG PO TABS: 80.0000 mg | ORAL_TABLET | Freq: Two times a day (BID) | ORAL | Status: DC

## 2014-05-17 ENCOUNTER — Other Ambulatory Visit (INDEPENDENT_AMBULATORY_CARE_PROVIDER_SITE_OTHER): Payer: BC Managed Care – PPO

## 2014-05-17 DIAGNOSIS — I2729 Other secondary pulmonary hypertension: Secondary | ICD-10-CM

## 2014-05-17 DIAGNOSIS — I2789 Other specified pulmonary heart diseases: Secondary | ICD-10-CM

## 2014-05-17 DIAGNOSIS — L940 Localized scleroderma [morphea]: Secondary | ICD-10-CM

## 2014-05-17 DIAGNOSIS — R69 Illness, unspecified: Secondary | ICD-10-CM

## 2014-05-17 LAB — BASIC METABOLIC PANEL
BUN: 40 mg/dL — ABNORMAL HIGH (ref 6–23)
CO2: 37 mEq/L — ABNORMAL HIGH (ref 19–32)
Calcium: 9.2 mg/dL (ref 8.4–10.5)
Chloride: 88 mEq/L — ABNORMAL LOW (ref 96–112)
Creatinine, Ser: 1.1 mg/dL (ref 0.4–1.2)
GFR: 55.36 mL/min — ABNORMAL LOW (ref >60.00)
Glucose, Bld: 99 mg/dL (ref 70–99)
Potassium: 3.4 mEq/L — ABNORMAL LOW (ref 3.5–5.1)
Sodium: 137 mEq/L (ref 135–145)

## 2014-05-17 LAB — HEPATIC FUNCTION PANEL
ALT: 17 U/L (ref 0–35)
AST: 32 U/L (ref 0–37)
Albumin: 4.1 g/dL (ref 3.5–5.2)
Alkaline Phosphatase: 63 U/L (ref 39–117)
Bilirubin, Direct: 0.1 mg/dL (ref 0.0–0.3)
Total Bilirubin: 0.9 mg/dL (ref 0.2–1.2)
Total Protein: 7.5 g/dL (ref 6.0–8.3)

## 2014-06-01 ENCOUNTER — Telehealth (HOSPITAL_COMMUNITY): Payer: Self-pay | Admitting: *Deleted

## 2014-06-01 NOTE — Telephone Encounter (Signed)
Patient made aware of change in medication from lasix to torsemide 67m BID.  Aware and agreeable.  Rx sent to preferred pharmacy electronically. [name]

## 2014-06-01 NOTE — Telephone Encounter (Signed)
Pt called to report the Metolazone makes her feel very lightheaded when she takes it and she has been taking it about once a week and the fluid comes back in a few days, on 3/31 she was weighing 125-126 which is her baseline and now she is staying more around 128-130, wt does decrease with metolazone but returns and she can feel the fluid in her belly, discussed with Dr [name] he would like for pt to change lasix to torsemide 40 mg Twice daily, attempted to call pt back and Left message for her to call back

## 2014-06-02 MED ORDER — TORSEMIDE 20 MG PO TABS: 40.0000 mg | ORAL_TABLET | Freq: Two times a day (BID) | ORAL | Status: DC

## 2014-06-02 NOTE — Addendum Note (Signed)
Addended by: Scarlette Calico on: 06/02/2014 08:29 AM   Modules accepted: Orders, Medications

## 2014-06-03 ENCOUNTER — Encounter (HOSPITAL_BASED_OUTPATIENT_CLINIC_OR_DEPARTMENT_OTHER): Payer: BC Managed Care – PPO | Attending: Internal Medicine

## 2014-06-03 DIAGNOSIS — L97809 Non-pressure chronic ulcer of other part of unspecified lower leg with unspecified severity: Secondary | ICD-10-CM | POA: Insufficient documentation

## 2014-06-03 DIAGNOSIS — L97909 Non-pressure chronic ulcer of unspecified part of unspecified lower leg with unspecified severity: Principal | ICD-10-CM

## 2014-06-03 DIAGNOSIS — I87339 Chronic venous hypertension (idiopathic) with ulcer and inflammation of unspecified lower extremity: Secondary | ICD-10-CM | POA: Insufficient documentation

## 2014-06-07 ENCOUNTER — Ambulatory Visit (INDEPENDENT_AMBULATORY_CARE_PROVIDER_SITE_OTHER): Payer: BC Managed Care – PPO | Admitting: Internal Medicine

## 2014-06-07 ENCOUNTER — Encounter: Payer: Self-pay | Admitting: Internal Medicine

## 2014-06-07 VITALS — BP 120/78 | HR 68 | Temp 98.3°F | Ht 68.0 in | Wt 132.0 lb

## 2014-06-07 DIAGNOSIS — I2789 Other specified pulmonary heart diseases: Secondary | ICD-10-CM

## 2014-06-07 DIAGNOSIS — I272 Pulmonary hypertension, unspecified: Secondary | ICD-10-CM

## 2014-06-07 DIAGNOSIS — M349 Systemic sclerosis, unspecified: Secondary | ICD-10-CM

## 2014-06-07 NOTE — Progress Notes (Signed)
Subjective:    [name], female    DOB: 07/12/1951, 63 y.o.   MRN: [id number]  HPI Pulmonary HTN with sclerdema Mood good Added third revatio and added weight? Added Demadex in the place of the lasix Noted 3-4 lbs on flow sheet. No increase in fluid in legs but wears hose.  Has an appointment with the cardiology in 2 weeks Discussed the need to effect the weight gain  Slowly       Review of Systems  Constitutional: Negative for activity change, appetite change and fatigue.  HENT: Negative for congestion, ear pain, postnasal drip and sinus pressure.   Eyes: Negative for redness and visual disturbance.  Respiratory: Negative for cough, shortness of breath and wheezing.   Gastrointestinal: Negative for abdominal pain and abdominal distention.  Genitourinary: Negative for dysuria, frequency and menstrual problem.  Musculoskeletal: Negative for arthralgias, joint swelling, myalgias and neck pain.  Skin: Negative for rash and wound.  Neurological: Negative for dizziness, weakness and headaches.  Hematological: Negative for adenopathy. Does not bruise/bleed easily.  Psychiatric/Behavioral: Negative for sleep disturbance and decreased concentration.   Past Medical History  Diagnosis Date  . History of chicken pox   . Hypertension   . Sclerodermia generalized   . Non Hodgkin's lymphoma     Treated with chemo 2010, felt to be cured.  . Melanoma 1999  . Peripheral arterial occlusive disease   . Neuropathic foot ulcer   . DOE (dyspnea on exertion)   . PAH (pulmonary artery hypertension)   . Epistaxis   . Pulmonary hypertension     History   Social History  . Marital Status: Married    Spouse Name: N/A    Number of Children: 1  . Years of Education: N/A   Occupational History  . homemaker    Social History Main Topics  . Smoking status: Never Smoker   . Smokeless tobacco: Never Used  . Alcohol Use: Yes     Comment: Occasional  . Drug Use: No  .  Sexual Activity: Not Currently   Other Topics Concern  . Not on file   Social History Narrative  . No narrative on file    Past Surgical History  Procedure Laterality Date  . Rt little toe removal  10/1998  . Stent left thigh  3/11  . Tonsillectomy and adenoidectomy    . Small intestine surgery    . Biopsy on cerebellum    . Vein ligation and stripping  05/16/2012    Procedure: VEIN LIGATION AND STRIPPING;  Surgeon: [name], MD;  Location: [facility] OR;  Service: Vascular;  Laterality: Right;  Irrigation and Debridement right lower leg, ligation of saphenous vein.  . Endovenous ablation saphenous vein w/ laser  07-24-2012    right greater saphenous vein by [name], M.D.   . Endovenous ablation saphenous vein w/ laser  10-16-2012    left greater saphenous vein  by Dr. [name]  . Apligraph  12-04-2102,   12-18-2012    bilateral calves   [name]  . Tunneled venous catheter placement  01/2013    Family History  Problem Relation Age of Onset  . Lupus Father   . Lymphoma Father   . Hyperlipidemia Mother   . Cancer Daughter     sarcoma  . Colon cancer      Allergies  Allergen Reactions  . Sulfa Antibiotics     Severe rash with significant peeling of face. No airway  involvement per patient  . Levaquin [Levofloxacin In D5w] Other (See Comments)    Other reaction(s): Other (See Comments) FLU LIKE SYMPTOM  . Penicillins Rash    Current Outpatient Prescriptions on File Prior to Visit  Medication Sig Dispense Refill  . acidophilus (RISAQUAD) CAPS Take 1 capsule by mouth every morning.       Marland Kitchen aspirin 81 MG tablet Take 81 mg by mouth every evening.       . bosentan (TRACLEER) 125 MG tablet Take 1 tablet (125 mg total) by mouth 2 (two) times daily.  60 tablet  3  . cholecalciferol (VITAMIN D) 1000 UNITS tablet Take 2,000 Units by mouth every evening.       . citalopram (CELEXA) 20 MG tablet take 1 tablet by mouth once daily  30 tablet  11  . clopidogrel  (PLAVIX) 75 MG tablet take 1 tablet by mouth once daily  30 tablet  12  . diphenhydrAMINE (BENADRYL) 25 mg capsule Take 1 capsule (25 mg total) by mouth every 12 (twelve) hours. X 3 days  6 capsule  0  . fish oil-omega-3 fatty acids 1000 MG capsule Take 1 g by mouth daily with lunch.       . gabapentin (NEURONTIN) 300 MG capsule Take 300 mg by mouth 2 (two) times daily. Take 1 at noon and 1 at bedtime      . gabapentin (NEURONTIN) 300 MG capsule take 1 capsule by mouth every morning and 2 every evening  90 capsule  11  . metolazone (ZAROXOLYN) 2.5 MG tablet Take 1 tablet (2.5 mg total) by mouth as needed.  10 tablet  3  . moxifloxacin (VIGAMOX) 0.5 % ophthalmic solution Place 1 drop into both eyes 3 (three) times daily.      . multivitamin (THERAGRAN) per tablet Take 2 tablets by mouth 2 (two) times daily.       . pantoprazole (PROTONIX) 40 MG tablet take 1 tablet by mouth once daily  30 tablet  3  . predniSONE (DELTASONE) 5 MG tablet take 1 tablet by mouth once daily  30 tablet  5  . sildenafil (REVATIO) 20 MG tablet Take 3 tablets (60 mg total) by mouth 3 (three) times daily.  270 tablet  6  . spironolactone (ALDACTONE) 25 MG tablet Take 25 mg by mouth every morning.      . torsemide (DEMADEX) 20 MG tablet Take 2 tablets (40 mg total) by mouth 2 (two) times daily.  120 tablet  3  . Treprostinil (TYVASO) 0.6 MG/ML SOLN Inhale 18 mcg into the lungs 4 (four) times daily.  3.6 mL  6  . vitamin B-12 (CYANOCOBALAMIN) 500 MCG tablet Take 500 mcg by mouth every morning.        No current facility-administered medications on file prior to visit.    BP 120/78  Pulse 68  Temp(Src) 98.3 F (36.8 C) (Oral)  Ht _0  (1.727 m)  Wt 132 lb (59.875 kg)  BMI 20.08 kg/m2  SpO2 98%       Objective:   Physical Exam  Constitutional: She is oriented to person, place, and time. She appears well-developed and well-nourished. No distress.  HENT:  Head: Normocephalic and atraumatic.  Nose: Nose normal.    Eyes: Conjunctivae and EOM are normal. Pupils are equal, round, and reactive to light.  Neck: Normal range of motion. Neck supple. No JVD present. No tracheal deviation present. No thyromegaly present.  Cardiovascular: Normal rate and regular rhythm.   Murmur  heard. Pulmonary/Chest: She has no wheezes. She exhibits no tenderness.  Abdominal: Soft. Bowel sounds are normal.  Musculoskeletal: She exhibits no edema and no tenderness.  Lymphadenopathy:    She has no cervical adenopathy.  Neurological: She is alert and oriented to person, place, and time. She has normal reflexes. No cranial nerve deficit.  Skin: She is not diaphoretic.  Psychiatric: Her behavior is normal.          Assessment & Plan:  weigth stable SOB better The oxygen  Need is better  Needs a referral for internal medicine  Very tough case

## 2014-06-07 NOTE — Progress Notes (Signed)
Pre visit review using our clinic review tool, if applicable. No additional management support is needed unless otherwise documented below in the visit note.

## 2014-06-07 NOTE — Patient Instructions (Signed)
The patient is instructed to continue all medications as prescribed. Schedule followup with check out clerk upon leaving the clinic

## 2014-06-10 ENCOUNTER — Other Ambulatory Visit: Payer: BC Managed Care – PPO

## 2014-06-10 ENCOUNTER — Telehealth (HOSPITAL_COMMUNITY): Payer: Self-pay | Admitting: *Deleted

## 2014-06-10 DIAGNOSIS — I2789 Other specified pulmonary heart diseases: Secondary | ICD-10-CM

## 2014-06-10 LAB — HEPATIC FUNCTION PANEL
ALT: 18 U/L (ref 0–35)
AST: 30 U/L (ref 0–37)
Albumin: 3.9 g/dL (ref 3.5–5.2)
Alkaline Phosphatase: 64 U/L (ref 39–117)
Bilirubin, Direct: 0 mg/dL (ref 0.0–0.3)
Total Bilirubin: 0.5 mg/dL (ref 0.2–1.2)
Total Protein: 7.4 g/dL (ref 6.0–8.3)

## 2014-06-10 NOTE — Telephone Encounter (Signed)
Pt in for monthly liver panel

## 2014-06-16 ENCOUNTER — Ambulatory Visit (HOSPITAL_COMMUNITY)
Admission: RE | Admit: 2014-06-16 | Discharge: 2014-06-16 | Disposition: A | Discharge status: Home / Self Care | Payer: BC Managed Care – PPO | Source: Ambulatory Visit | Attending: Internal Medicine | Admitting: Internal Medicine

## 2014-06-16 ENCOUNTER — Encounter (HOSPITAL_COMMUNITY): Payer: Self-pay

## 2014-06-16 VITALS — BP 120/52 | HR 71 | Wt 133.8 lb

## 2014-06-16 DIAGNOSIS — Z9981 Dependence on supplemental oxygen: Secondary | ICD-10-CM | POA: Insufficient documentation

## 2014-06-16 DIAGNOSIS — M349 Systemic sclerosis, unspecified: Secondary | ICD-10-CM | POA: Insufficient documentation

## 2014-06-16 DIAGNOSIS — I272 Pulmonary hypertension, unspecified: Secondary | ICD-10-CM

## 2014-06-16 DIAGNOSIS — I2789 Other specified pulmonary heart diseases: Secondary | ICD-10-CM

## 2014-06-16 DIAGNOSIS — I2510 Atherosclerotic heart disease of native coronary artery without angina pectoris: Secondary | ICD-10-CM | POA: Insufficient documentation

## 2014-06-16 DIAGNOSIS — Z7982 Long term (current) use of aspirin: Secondary | ICD-10-CM | POA: Insufficient documentation

## 2014-06-16 DIAGNOSIS — Z7901 Long term (current) use of anticoagulants: Secondary | ICD-10-CM | POA: Insufficient documentation

## 2014-06-16 DIAGNOSIS — L97209 Non-pressure chronic ulcer of unspecified calf with unspecified severity: Secondary | ICD-10-CM | POA: Insufficient documentation

## 2014-06-16 DIAGNOSIS — M3481 Systemic sclerosis with lung involvement: Secondary | ICD-10-CM | POA: Insufficient documentation

## 2014-06-16 MED ORDER — SILDENAFIL CITRATE 20 MG PO TABS: 40.0000 mg | ORAL_TABLET | Freq: Three times a day (TID) | ORAL | Status: DC

## 2014-06-16 NOTE — Progress Notes (Signed)
[name], female   DOB: 1951-02-06, 63 y.o.   MRN: [id number] Primary Care: Dr. Benay Pillow Primary Cardiologist: Dr. [name] Pulmonologist: Dr. Gwenette Greet  HPI: [name] is a 63 y.o. female with history of scleroderma dx'd in 1968, [place] of the brain treated with chem which is in remission since 2010, pulmonary hypertension diagnosed in 2007.    Diagnosed with PAH in 2007 in [place]. We saw her in 2013.   RHC 04/18/12  RA = 9  RV = 75/11/14  PA = 71/25 (44)  PCW = 4  Fick cardiac output/index = 3.0/1.7  Thermo CO/CI = 2.7/1.5  PVR = 13.3 Woods  FA sat = 79% on room air (checked 3 times). Sat with finger probe on air 94%  PA sat = 41%, 43%   6 MW 04/18/12 880 feet 6 MW 11/10/12 650 feet. Limited by legs. Sats down to 85% on 5L (sats at rest 95%)   Echo 10/13 LV EF 65-70%. RV severely enlarged/HK severe TR. RVSP 31mHg  Repeat RHC 11/14/12  RA = 14  RV = 84/8/20  PA = 83/29 (50)  PCW = 10  Fick cardiac output/index = 4.5/2.5  Thermo CO/CI = 3.8/2.2  PVR = 8.9 Woods (Fick)  FA sat = 86% on 3L  PA sat = 55%, 57  [facility] F[facility]illes Chiquitoand started on IV epoprostenol (Veletri) in 01/2013. Also on Bosentan and Revatio 40 tid.  She had intolerable side effects with Veletri.  [facility] Veletri wean and Tyvaso initiation.  She was intolerant of 12 breaths of Tyvaso but able to tolerate 6 breaths 3 times a day.   ABI 4/15: R 0.86 L 1.0   Echo (3/15) with EF 60-65%, mildly D-shaped septum, RV with mild to moderate dilation and mildly decreased systolic function, mild TR, PA systolic pressure 62 mmHg, IVC not dilated.   At last visit Revatio increased to 60 tid. Doesn't notice much benefit with Tyvaso. Several weeks ago had 5 pound weight gain. Took metolazone for 2 days. Lost 3 pounds but got very lightheaded. Eventually switched from lasix to torsemide. Doesn't notice that it made much difference. Feels she is swelling around the  midsection. Wounds healing.  Remains on 4 liters continuous oxygen during the day and 2 liters continuous at night. Able to perform additional ADLs without dyspnea. Able to take 15+ minute walks, no dyspnea walking on flat ground.  Weight stable.  No orthopnea/PND.  She is following with Dr. BAmil Amenin rheumatology - decided not to retreat with Cellcept. Remains on prednisone 529mday. LFTs ok last week.    6 MW 3/15 915 feet (279 m)  Labs (3/15): K 3.9, creatinine 1.0, LFTs normal 05/17/14: K 3.4 Cr 1.1 Bun 40 LFTs ok   Review of Systems: All pertinent positives and negatives as in HPI, otherwise negative.   Past Medical History  Diagnosis Date  . History of chicken pox   . Hypertension   . Sclerodermia generalized   . Non Hodgkin's lymphoma     Treated with chemo 2010, felt to be cured.  . Melanoma 1999  . Peripheral arterial occlusive disease   . Neuropathic foot ulcer   . DOE (dyspnea on exertion)   . PAH (pulmonary artery hypertension)   . Epistaxis   . Pulmonary hypertension     Current Outpatient Prescriptions  Medication Sig Dispense Refill  . acidophilus (RISAQUAD) CAPS Take 1 capsule by mouth every morning.       .Marland Kitchen  aspirin 81 MG tablet Take 81 mg by mouth every evening.       . bosentan (TRACLEER) 125 MG tablet Take 1 tablet (125 mg total) by mouth 2 (two) times daily.  60 tablet  3  . cholecalciferol (VITAMIN D) 1000 UNITS tablet Take 2,000 Units by mouth every evening.       . citalopram (CELEXA) 20 MG tablet take 1 tablet by mouth once daily  30 tablet  11  . clopidogrel (PLAVIX) 75 MG tablet take 1 tablet by mouth once daily  30 tablet  12  . fish oil-omega-3 fatty acids 1000 MG capsule Take 1 g by mouth daily with lunch.       . gabapentin (NEURONTIN) 300 MG capsule take 1 capsule by mouth every morning and 2 every evening  90 capsule  11  . moxifloxacin (VIGAMOX) 0.5 % ophthalmic solution Place 1 drop into both eyes 3 (three) times daily.      . multivitamin  (THERAGRAN) per tablet Take 2 tablets by mouth 2 (two) times daily.       . pantoprazole (PROTONIX) 40 MG tablet take 1 tablet by mouth once daily  30 tablet  3  . predniSONE (DELTASONE) 5 MG tablet take 1 tablet by mouth once daily  30 tablet  5  . sildenafil (REVATIO) 20 MG tablet Take 3 tablets (60 mg total) by mouth 3 (three) times daily.  270 tablet  6  . spironolactone (ALDACTONE) 25 MG tablet Take 25 mg by mouth every morning.      . torsemide (DEMADEX) 20 MG tablet Take 2 tablets (40 mg total) by mouth 2 (two) times daily.  120 tablet  3  . Treprostinil (TYVASO) 0.6 MG/ML SOLN Inhale 18 mcg into the lungs 4 (four) times daily.  3.6 mL  6  . vitamin B-12 (CYANOCOBALAMIN) 500 MCG tablet Take 500 mcg by mouth every morning.        No current facility-administered medications for this encounter.    Allergies  Allergen Reactions  . Sulfa Antibiotics     Severe rash with significant peeling of face. No airway involvement per patient  . Levaquin [Levofloxacin In D5w] Other (See Comments)    Other reaction(s): Other (See Comments) FLU LIKE SYMPTOM  . Penicillins Rash     Filed Vitals:   06/16/14 1444  BP: 120/52  Pulse: 71  Weight: 133 lb 12.8 oz (60.691 kg)  SpO2: 98%    PHYSICAL EXAM: General: No acute distress No respiratory difficulty HEENT: normal Neck: supple. JVP 6. Carotids 2+ bilat; no bruits. No lymphadenopathy or thryomegaly appreciated. Cor: PMI nondisplaced. Regular rate & rhythm. Prominent P2. Soft TR murmur. + RV lift Lungs: clear on 4 liters [place] Abdomen: soft, nontender, nondistended. No hepatosplenomegaly. No bruits or masses. Good bowel sounds. 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 + gouty nodules. LEs wrapped due to ulcers.  No edema Neuro: alert & oriented x 3, cranial nerves grossly intact. moves all 4 extremities w/o difficulty. Affect pleasant.  ASSESSMENT & PLAN:  1. PAH: Group one, related to scleroderma.  Unable  to tolerate IV epoprostenol and now on triple therapy with bosentan, sildenafil, and Tyvaso.  WHO class II-III dyspnea.  - She feels abdominal bloating related to increased sildenafil. I suspect it may be adipose tissue. Will try to cut sildenafil to 40 mg tid and see if it improves, If no change go back to 60 - Continue standing monthly LFTs given bosentan use.  -  Continue current Tyvaso dose. - I worry she may be a little overdiuresed. Check BMET.   - RTC in 3 months with 6MW and echo 2. Scleroderma: Followed by Dr. Amil Amen.  3. Chronic right calf ulceration: ABIs stable. Follwoed by Wound Care.   4. PAD:  ABIs stable.  She is on Plavix.    Benay Spice 06/16/2014

## 2014-06-16 NOTE — Addendum Note (Signed)
Encounter addended by: Micki Riley, RN on: 06/16/2014  4:34 PM<BR>     Documentation filed: Orders

## 2014-06-18 ENCOUNTER — Telehealth (HOSPITAL_COMMUNITY): Payer: Self-pay | Admitting: Vascular Surgery

## 2014-06-18 NOTE — Telephone Encounter (Signed)
Spoke with Ronalee Belts from [facility] to verify dose changes for sildenfil- last OV Dr.Bensimhon decreased to 40 mg TID Unclear on tyvaso, pt reports 6 breaths TID, chart documents/rx reads QID  Will verify with pt and Dr.Bensimhon

## 2014-06-18 NOTE — Telephone Encounter (Signed)
Please call [facility] to confirm dosage changes on 2 medication

## 2014-06-21 ENCOUNTER — Other Ambulatory Visit: Payer: BC Managed Care – PPO

## 2014-06-21 DIAGNOSIS — I272 Pulmonary hypertension, unspecified: Secondary | ICD-10-CM

## 2014-06-21 LAB — BASIC METABOLIC PANEL
BUN: 34 mg/dL — ABNORMAL HIGH (ref 6–23)
CO2: 32 mEq/L (ref 19–32)
Calcium: 9.7 mg/dL (ref 8.4–10.5)
Chloride: 96 mEq/L (ref 96–112)
Creat: 1.15 mg/dL — ABNORMAL HIGH (ref 0.50–1.10)
Glucose, Bld: 88 mg/dL (ref 70–99)
Potassium: 4.3 mEq/L (ref 3.5–5.3)
Sodium: 139 mEq/L (ref 135–145)

## 2014-06-22 NOTE — Telephone Encounter (Signed)
rx for tyvaso left to reflect QID so quantity will be available when she can do QID Pt aware and  [facility] aware

## 2014-06-22 NOTE — Telephone Encounter (Signed)
Yes if that is all she can do.

## 2014-06-22 NOTE — Telephone Encounter (Signed)
Pt reports 6 breaths TID however on days that she is able she takes QID- 2/3 per week  Dr.Bensimhon-> Ok to leave RX at QID?

## 2014-07-08 ENCOUNTER — Encounter (HOSPITAL_BASED_OUTPATIENT_CLINIC_OR_DEPARTMENT_OTHER): Payer: BC Managed Care – PPO | Attending: Internal Medicine

## 2014-07-08 DIAGNOSIS — L97809 Non-pressure chronic ulcer of other part of unspecified lower leg with unspecified severity: Secondary | ICD-10-CM | POA: Insufficient documentation

## 2014-07-08 DIAGNOSIS — I87339 Chronic venous hypertension (idiopathic) with ulcer and inflammation of unspecified lower extremity: Secondary | ICD-10-CM | POA: Insufficient documentation

## 2014-07-08 DIAGNOSIS — L97909 Non-pressure chronic ulcer of unspecified part of unspecified lower leg with unspecified severity: Principal | ICD-10-CM

## 2014-07-11 ENCOUNTER — Other Ambulatory Visit: Payer: Self-pay | Admitting: Internal Medicine

## 2014-07-15 ENCOUNTER — Other Ambulatory Visit: Payer: BC Managed Care – PPO

## 2014-07-15 ENCOUNTER — Telehealth (HOSPITAL_COMMUNITY): Payer: Self-pay | Admitting: *Deleted

## 2014-07-15 DIAGNOSIS — I2789 Other specified pulmonary heart diseases: Secondary | ICD-10-CM

## 2014-07-15 LAB — HEPATIC FUNCTION PANEL
ALT: 15 U/L (ref 0–35)
AST: 26 U/L (ref 0–37)
Albumin: 3.9 g/dL (ref 3.5–5.2)
Alkaline Phosphatase: 94 U/L (ref 39–117)
Bilirubin, Direct: 0 mg/dL (ref 0.0–0.3)
Total Bilirubin: 0.6 mg/dL (ref 0.2–1.2)
Total Protein: 7.8 g/dL (ref 6.0–8.3)

## 2014-07-15 NOTE — Telephone Encounter (Signed)
Order placed for pt's monthly liver test due to medication

## 2014-07-16 ENCOUNTER — Other Ambulatory Visit: Payer: Self-pay | Admitting: Internal Medicine

## 2014-08-03 ENCOUNTER — Other Ambulatory Visit: Payer: Self-pay | Admitting: Dermatology

## 2014-08-05 ENCOUNTER — Ambulatory Visit (HOSPITAL_COMMUNITY): Payer: BC Managed Care – PPO

## 2014-08-05 ENCOUNTER — Encounter (HOSPITAL_BASED_OUTPATIENT_CLINIC_OR_DEPARTMENT_OTHER): Payer: BC Managed Care – PPO | Attending: Internal Medicine

## 2014-08-05 DIAGNOSIS — L97909 Non-pressure chronic ulcer of unspecified part of unspecified lower leg with unspecified severity: Secondary | ICD-10-CM | POA: Insufficient documentation

## 2014-08-05 DIAGNOSIS — I87339 Chronic venous hypertension (idiopathic) with ulcer and inflammation of unspecified lower extremity: Secondary | ICD-10-CM | POA: Insufficient documentation

## 2014-08-09 ENCOUNTER — Other Ambulatory Visit (HOSPITAL_COMMUNITY): Payer: Self-pay

## 2014-08-09 MED ORDER — BOSENTAN 125 MG PO TABS: 125.0000 mg | ORAL_TABLET | Freq: Two times a day (BID) | ORAL | Status: DC

## 2014-08-11 ENCOUNTER — Ambulatory Visit (HOSPITAL_COMMUNITY)
Admission: RE | Admit: 2014-08-11 | Discharge: 2014-08-11 | Disposition: A | Discharge status: Home / Self Care | Payer: BC Managed Care – PPO | Source: Ambulatory Visit | Attending: Internal Medicine | Admitting: Internal Medicine

## 2014-08-11 DIAGNOSIS — Z87898 Personal history of other specified conditions: Secondary | ICD-10-CM | POA: Insufficient documentation

## 2014-08-11 DIAGNOSIS — C8589 Other specified types of non-Hodgkin lymphoma, extranodal and solid organ sites: Secondary | ICD-10-CM

## 2014-08-11 LAB — POCT I-STAT CREATININE: Creatinine, Ser: 1.4 mg/dL — ABNORMAL HIGH (ref 0.50–1.10)

## 2014-08-11 MED ORDER — GADOBENATE DIMEGLUMINE 529 MG/ML IV SOLN
10.0000 mL | Freq: Once | INTRAVENOUS | Status: AC | PRN
  Administered 2014-08-11: 10 mL via INTRAVENOUS

## 2014-08-12 DIAGNOSIS — L97909 Non-pressure chronic ulcer of unspecified part of unspecified lower leg with unspecified severity: Secondary | ICD-10-CM | POA: Diagnosis not present

## 2014-08-12 DIAGNOSIS — I87339 Chronic venous hypertension (idiopathic) with ulcer and inflammation of unspecified lower extremity: Secondary | ICD-10-CM | POA: Diagnosis not present

## 2014-08-16 ENCOUNTER — Other Ambulatory Visit: Payer: BC Managed Care – PPO

## 2014-08-16 DIAGNOSIS — I2789 Other specified pulmonary heart diseases: Secondary | ICD-10-CM

## 2014-08-16 LAB — HEPATIC FUNCTION PANEL
ALT: 18 U/L (ref 0–35)
AST: 29 U/L (ref 0–37)
Albumin: 3.8 g/dL (ref 3.5–5.2)
Alkaline Phosphatase: 83 U/L (ref 39–117)
Bilirubin, Direct: 0.1 mg/dL (ref 0.0–0.3)
Total Bilirubin: 1.1 mg/dL (ref 0.2–1.2)
Total Protein: 7.3 g/dL (ref 6.0–8.3)

## 2014-08-18 ENCOUNTER — Other Ambulatory Visit (HOSPITAL_COMMUNITY): Payer: Self-pay | Admitting: Anesthesiology

## 2014-08-18 DIAGNOSIS — I2721 Secondary pulmonary arterial hypertension: Secondary | ICD-10-CM

## 2014-08-19 DIAGNOSIS — L97909 Non-pressure chronic ulcer of unspecified part of unspecified lower leg with unspecified severity: Secondary | ICD-10-CM | POA: Diagnosis not present

## 2014-08-19 DIAGNOSIS — I87339 Chronic venous hypertension (idiopathic) with ulcer and inflammation of unspecified lower extremity: Secondary | ICD-10-CM | POA: Diagnosis not present

## 2014-08-26 DIAGNOSIS — I87339 Chronic venous hypertension (idiopathic) with ulcer and inflammation of unspecified lower extremity: Secondary | ICD-10-CM | POA: Diagnosis not present

## 2014-08-26 DIAGNOSIS — L97909 Non-pressure chronic ulcer of unspecified part of unspecified lower leg with unspecified severity: Secondary | ICD-10-CM | POA: Diagnosis not present

## 2014-09-02 ENCOUNTER — Other Ambulatory Visit: Payer: Self-pay | Admitting: Emergency Medicine

## 2014-09-02 ENCOUNTER — Encounter (HOSPITAL_BASED_OUTPATIENT_CLINIC_OR_DEPARTMENT_OTHER): Payer: BC Managed Care – PPO | Attending: Internal Medicine

## 2014-09-02 DIAGNOSIS — L97909 Non-pressure chronic ulcer of unspecified part of unspecified lower leg with unspecified severity: Secondary | ICD-10-CM | POA: Diagnosis not present

## 2014-09-02 DIAGNOSIS — I87339 Chronic venous hypertension (idiopathic) with ulcer and inflammation of unspecified lower extremity: Secondary | ICD-10-CM | POA: Diagnosis present

## 2014-09-09 DIAGNOSIS — I87339 Chronic venous hypertension (idiopathic) with ulcer and inflammation of unspecified lower extremity: Secondary | ICD-10-CM | POA: Diagnosis not present

## 2014-09-09 DIAGNOSIS — L97909 Non-pressure chronic ulcer of unspecified part of unspecified lower leg with unspecified severity: Secondary | ICD-10-CM | POA: Diagnosis not present

## 2014-09-14 ENCOUNTER — Other Ambulatory Visit: Payer: BC Managed Care – PPO

## 2014-09-14 DIAGNOSIS — I2789 Other specified pulmonary heart diseases: Secondary | ICD-10-CM

## 2014-09-14 LAB — HEPATIC FUNCTION PANEL
ALT: 17 U/L (ref 0–35)
AST: 28 U/L (ref 0–37)
Albumin: 3.8 g/dL (ref 3.5–5.2)
Alkaline Phosphatase: 88 U/L (ref 39–117)
Bilirubin, Direct: 0.1 mg/dL (ref 0.0–0.3)
Total Bilirubin: 0.8 mg/dL (ref 0.2–1.2)
Total Protein: 7.5 g/dL (ref 6.0–8.3)

## 2014-09-16 DIAGNOSIS — L97909 Non-pressure chronic ulcer of unspecified part of unspecified lower leg with unspecified severity: Secondary | ICD-10-CM | POA: Diagnosis not present

## 2014-09-16 DIAGNOSIS — I87339 Chronic venous hypertension (idiopathic) with ulcer and inflammation of unspecified lower extremity: Secondary | ICD-10-CM | POA: Diagnosis not present

## 2014-09-17 ENCOUNTER — Encounter (HOSPITAL_COMMUNITY): Payer: Self-pay | Admitting: Vascular Surgery

## 2014-09-17 ENCOUNTER — Encounter: Payer: Self-pay | Admitting: Internal Medicine

## 2014-09-17 ENCOUNTER — Ambulatory Visit (INDEPENDENT_AMBULATORY_CARE_PROVIDER_SITE_OTHER): Payer: BC Managed Care – PPO | Admitting: Internal Medicine

## 2014-09-17 VITALS — BP 122/64 | HR 62 | Temp 98.1°F | Resp 20 | Wt 134.4 lb

## 2014-09-17 DIAGNOSIS — Z23 Encounter for immunization: Secondary | ICD-10-CM

## 2014-09-17 DIAGNOSIS — I83223 Varicose veins of left lower extremity with both ulcer of ankle and inflammation: Secondary | ICD-10-CM

## 2014-09-17 DIAGNOSIS — R69 Illness, unspecified: Secondary | ICD-10-CM

## 2014-09-17 DIAGNOSIS — L97929 Non-pressure chronic ulcer of unspecified part of left lower leg with unspecified severity: Secondary | ICD-10-CM

## 2014-09-17 DIAGNOSIS — I83222 Varicose veins of left lower extremity with both ulcer of calf and inflammation: Secondary | ICD-10-CM

## 2014-09-17 DIAGNOSIS — I83221 Varicose veins of left lower extremity with both ulcer of thigh and inflammation: Secondary | ICD-10-CM

## 2014-09-17 DIAGNOSIS — I10 Essential (primary) hypertension: Secondary | ICD-10-CM

## 2014-09-17 DIAGNOSIS — I2789 Other specified pulmonary heart diseases: Secondary | ICD-10-CM

## 2014-09-17 DIAGNOSIS — L97919 Non-pressure chronic ulcer of unspecified part of right lower leg with unspecified severity: Secondary | ICD-10-CM

## 2014-09-17 DIAGNOSIS — L940 Localized scleroderma [morphea]: Secondary | ICD-10-CM

## 2014-09-17 DIAGNOSIS — I2729 Other secondary pulmonary hypertension: Secondary | ICD-10-CM

## 2014-09-17 DIAGNOSIS — I83224 Varicose veins of left lower extremity with both ulcer of heel and midfoot and inflammation: Secondary | ICD-10-CM

## 2014-09-17 DIAGNOSIS — I83228 Varicose veins of left lower extremity with both ulcer of other part of lower extremity and inflammation: Secondary | ICD-10-CM

## 2014-09-17 DIAGNOSIS — I83225 Varicose veins of left lower extremity with both ulcer other part of foot and inflammation: Secondary | ICD-10-CM

## 2014-09-17 DIAGNOSIS — I83219 Varicose veins of right lower extremity with both ulcer of unspecified site and inflammation: Secondary | ICD-10-CM

## 2014-09-17 DIAGNOSIS — I83229 Varicose veins of left lower extremity with both ulcer of unspecified site and inflammation: Secondary | ICD-10-CM

## 2014-09-17 NOTE — Progress Notes (Signed)
Pre visit review using our clinic review tool, if applicable. No additional management support is needed unless otherwise documented below in the visit note.

## 2014-09-17 NOTE — Assessment & Plan Note (Signed)
Continue to use compression stockings and follow with wound clinic. They have started her on doxycycline and wound not looking infected at today's visit.

## 2014-09-17 NOTE — Patient Instructions (Signed)
I will send a message to Dr. [name] and let him know about the swelling to see if they need to change your torsemide.   We will see you back in about 6 months to check on things. If you have problems before then please feel free to call us sooner.

## 2014-09-17 NOTE — Assessment & Plan Note (Signed)
Stable at this time but complicated by [facility] and ulcers on her legs. She does follow with pulmonology, cardiology, wound clinic.

## 2014-09-17 NOTE — Assessment & Plan Note (Signed)
She takes revatio 2 pills TID at this time, bosentan, tyvaso. She is due for follow up with Dr. [name] and will forward visit to him. She wants her torsemide increased although I am not sure she really has excessive volume. She has minimal edema at the top of compression stockings which is likely more related to venous insufficiency than volume overload. No sounds of fluid in her lungs or abdomen.

## 2014-09-17 NOTE — Progress Notes (Signed)
   Subjective:    [name], female    DOB: 13-Jun-1951, 63 y.o.   MRN: [id number]  HPI The patient is a 63 YO female who is coming in today to establish care. She has PMH of scleroderma which caused PAH. She has some circulation issues in her legs and has an ulcer which she is working with the wound center to heal now. She also has HTN, hyperlipidemia. She is fairly functional and is able to walk around the grocery store without getting winded. She does use oxygen all the time and does not get SOB with activity. 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 She denies abdominal pain, chest pains, diarrhea, constipation. She does notice some edema in her legs above her compression stockings as the day progresses and she wants to know if her torsemide needs to be adjusted.   Review of Systems  Constitutional: Negative for fever, chills, activity change and fatigue.  HENT: Negative.   Eyes: Negative.   Respiratory: Negative for cough, chest tightness, shortness of breath and wheezing.   Cardiovascular: Positive for leg swelling. Negative for chest pain and palpitations.  Gastrointestinal: Negative for abdominal pain, diarrhea, constipation and abdominal distention.  Skin: Positive for wound.      Objective:   Physical Exam  Constitutional: She appears well-developed and well-nourished. No distress.  HENT:  Head: Normocephalic and atraumatic.  Eyes: EOM are normal.  Neck: Normal range of motion.  Cardiovascular: Normal rate and regular rhythm.   Pulmonary/Chest: Effort normal and breath sounds normal.  Abdominal: Soft. Bowel sounds are normal. She exhibits no distension. There is no tenderness. There is no rebound.  Skin: Skin is warm.  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 She does have ulcer on her left leg which does not appear infected.    Filed Vitals:   09/17/14 1540    BP: 122/64  Pulse: 62  Temp: 98.1 F (36.7 C)  TempSrc: Oral  Resp: 20  Weight: 134 lb 6.4 oz (60.963 kg)      Assessment & Plan:

## 2014-09-17 NOTE — Assessment & Plan Note (Signed)
BP well controlled at today's visit. Filed Vitals:   09/17/14 1540  BP: 122/64  Pulse: 62  Temp: 98.1 F (36.7 C)  TempSrc: Oral  Resp: 20  Weight: 134 lb 6.4 oz (60.963 kg)

## 2014-09-27 ENCOUNTER — Other Ambulatory Visit (HOSPITAL_COMMUNITY): Payer: Self-pay

## 2014-09-27 MED ORDER — TORSEMIDE 20 MG PO TABS: 40.0000 mg | ORAL_TABLET | Freq: Two times a day (BID) | ORAL | Status: DC

## 2014-09-30 ENCOUNTER — Encounter (HOSPITAL_BASED_OUTPATIENT_CLINIC_OR_DEPARTMENT_OTHER): Payer: BC Managed Care – PPO | Attending: Internal Medicine

## 2014-09-30 DIAGNOSIS — I872 Venous insufficiency (chronic) (peripheral): Secondary | ICD-10-CM | POA: Diagnosis not present

## 2014-09-30 DIAGNOSIS — L97929 Non-pressure chronic ulcer of unspecified part of left lower leg with unspecified severity: Secondary | ICD-10-CM | POA: Diagnosis not present

## 2014-10-07 DIAGNOSIS — L97929 Non-pressure chronic ulcer of unspecified part of left lower leg with unspecified severity: Secondary | ICD-10-CM | POA: Diagnosis not present

## 2014-10-07 DIAGNOSIS — I872 Venous insufficiency (chronic) (peripheral): Secondary | ICD-10-CM | POA: Diagnosis not present

## 2014-10-13 ENCOUNTER — Other Ambulatory Visit: Payer: BC Managed Care – PPO

## 2014-10-13 DIAGNOSIS — I279 Pulmonary heart disease, unspecified: Secondary | ICD-10-CM

## 2014-10-13 LAB — HEPATIC FUNCTION PANEL
ALT: 19 U/L (ref 0–35)
AST: 27 U/L (ref 0–37)
Albumin: 3.3 g/dL — ABNORMAL LOW (ref 3.5–5.2)
Alkaline Phosphatase: 97 U/L (ref 39–117)
Bilirubin, Direct: 0.1 mg/dL (ref 0.0–0.3)
Total Bilirubin: 0.8 mg/dL (ref 0.2–1.2)
Total Protein: 8 g/dL (ref 6.0–8.3)

## 2014-10-14 DIAGNOSIS — L97929 Non-pressure chronic ulcer of unspecified part of left lower leg with unspecified severity: Secondary | ICD-10-CM | POA: Diagnosis not present

## 2014-10-14 DIAGNOSIS — I872 Venous insufficiency (chronic) (peripheral): Secondary | ICD-10-CM | POA: Diagnosis not present

## 2014-10-21 DIAGNOSIS — L97929 Non-pressure chronic ulcer of unspecified part of left lower leg with unspecified severity: Secondary | ICD-10-CM | POA: Diagnosis not present

## 2014-10-21 DIAGNOSIS — I872 Venous insufficiency (chronic) (peripheral): Secondary | ICD-10-CM | POA: Diagnosis not present

## 2014-10-26 ENCOUNTER — Encounter (HOSPITAL_COMMUNITY): Payer: Self-pay

## 2014-10-26 ENCOUNTER — Ambulatory Visit (INDEPENDENT_AMBULATORY_CARE_PROVIDER_SITE_OTHER)
Admission: RE | Admit: 2014-10-26 | Discharge: 2014-10-26 | Disposition: A | Discharge status: Home / Self Care | Payer: BC Managed Care – PPO | Source: Ambulatory Visit | Attending: Cardiology | Admitting: Cardiology

## 2014-10-26 ENCOUNTER — Ambulatory Visit (HOSPITAL_COMMUNITY)
Admission: RE | Admit: 2014-10-26 | Discharge: 2014-10-26 | Disposition: A | Discharge status: Home / Self Care | Payer: BC Managed Care – PPO | Source: Ambulatory Visit | Attending: Cardiology | Admitting: Cardiology

## 2014-10-26 VITALS — BP 142/66 | HR 66 | Wt 135.8 lb

## 2014-10-26 DIAGNOSIS — I2729 Other secondary pulmonary hypertension: Secondary | ICD-10-CM

## 2014-10-26 DIAGNOSIS — I739 Peripheral vascular disease, unspecified: Secondary | ICD-10-CM | POA: Diagnosis not present

## 2014-10-26 DIAGNOSIS — M349 Systemic sclerosis, unspecified: Secondary | ICD-10-CM | POA: Insufficient documentation

## 2014-10-26 DIAGNOSIS — L940 Localized scleroderma [morphea]: Secondary | ICD-10-CM

## 2014-10-26 DIAGNOSIS — Z8572 Personal history of non-Hodgkin lymphomas: Secondary | ICD-10-CM | POA: Insufficient documentation

## 2014-10-26 DIAGNOSIS — R69 Illness, unspecified: Secondary | ICD-10-CM

## 2014-10-26 DIAGNOSIS — I779 Disorder of arteries and arterioles, unspecified: Secondary | ICD-10-CM

## 2014-10-26 DIAGNOSIS — L97219 Non-pressure chronic ulcer of right calf with unspecified severity: Secondary | ICD-10-CM | POA: Diagnosis not present

## 2014-10-26 DIAGNOSIS — I272 Other secondary pulmonary hypertension: Secondary | ICD-10-CM | POA: Insufficient documentation

## 2014-10-26 DIAGNOSIS — I2721 Secondary pulmonary arterial hypertension: Secondary | ICD-10-CM

## 2014-10-26 DIAGNOSIS — I379 Nonrheumatic pulmonary valve disorder, unspecified: Secondary | ICD-10-CM

## 2014-10-26 DIAGNOSIS — I10 Essential (primary) hypertension: Secondary | ICD-10-CM | POA: Insufficient documentation

## 2014-10-26 DIAGNOSIS — Z7982 Long term (current) use of aspirin: Secondary | ICD-10-CM | POA: Diagnosis not present

## 2014-10-26 DIAGNOSIS — Z7901 Long term (current) use of anticoagulants: Secondary | ICD-10-CM | POA: Diagnosis not present

## 2014-10-26 NOTE — Progress Notes (Signed)
  Echocardiogram 2D Echocardiogram has been performed.  Darlina Sicilian M 10/26/2014, 11:41 AM

## 2014-10-26 NOTE — Patient Instructions (Signed)
Will look into cost of Opsumit  Stop Tyvaso for 1 week, if not feeling better then restart, if you are feeling better off medication give [place] a call and schedule an appointment to come back in 2 weeks  Otherwise follow up in 2 months

## 2014-10-26 NOTE — Progress Notes (Signed)
[name], female   DOB: [birth year], 63 y.o.   MRN: [id number] Primary Cardiologist: Dr. [name] Pulmonologist: Dr. Gwenette Greet  HPI: [name] is a 63 y.o. female with history of scleroderma dx'd in 1968, [place] of the brain treated with chem which is in remission since 2010, pulmonary hypertension diagnosed in 2007.    Diagnosed with PAH in 2007 in [place]. We saw her in 2013.   RHC 04/18/12  RA = 9  RV = 75/11/14  PA = 71/25 (44)  PCW = 4  Fick cardiac output/index = 3.0/1.7  Thermo CO/CI = 2.7/1.5  PVR = 13.3 Woods  FA sat = 79% on room air (checked 3 times). Sat with finger probe on air 94%  PA sat = 41%, 43%   6 MW 04/18/12 880 feet 6 MW 11/10/12 650 feet. Limited by legs. Sats down to 85% on 5L (sats at rest 95%)   Echo 10/13 LV EF 65-70%. RV severely enlarged/HK severe TR. RVSP 13mHg  Repeat RHC 11/14/12  RA = 14  RV = 84/8/20  PA = 83/29 (50)  PCW = 10  Fick cardiac output/index = 4.5/2.5  Thermo CO/CI = 3.8/2.2  PVR = 8.9 Woods (Fick)  FA sat = 86% on 3L  PA sat = 55%, 57  [facility] started on IV epoprostenol (Veletri) in 01/2013. Also on Bosentan and Revatio 40 tid.  She had intolerable side effects with Veletri.  [facility] Veletri wean and Tyvaso initiation.  She was intolerant of 12 breaths of Tyvaso but able to tolerate 6 breaths 3 times a day.   ABI 4/15: R 0.86 L 1.0   Echo (3/15) with EF 60-65%, mildly D-shaped septum, RV with mild to moderate dilation and mildly decreased systolic function, mild TR, PA systolic pressure 62 mmHg, IVC not dilated.   Echo (10/15) with EF 60-65%, D-shaped interventricular septum, moderately dilated RV with moderately decreased systolic function, PA systolic pressure 72 mmHg, IVC not dilated.   6 MW 3/15 915 feet (279 m)  Symptomatically, she is doing pretty well.  She has been cutting her oxygen back to 2 L [place] instead of 4 L to see if she can get a small  portable tank (needs to be on 2 L).  [place]  She is short of breath really only with heavy exertion.  No chest pain.  No lightheadedness or syncope.  She does not feel like Tyvaso has helped her any and wants to stop it.   Labs (3/15): K 3.9, creatinine 1.0, LFTs normal Labs (05/17/14): K 3.4 Cr 1.1 Bun 40 LFTs ok  Labs (6/15): K 4.3, creatinine 1.15 Labs (10/15): LFTs normal  Review of Systems: All pertinent positives and negatives as in HPI, otherwise negative.   Past Medical History  Diagnosis Date  . History of chicken pox   . Hypertension   . Sclerodermia generalized   . Non Hodgkin's lymphoma     Treated with chemo 2010, felt to be cured.  . Melanoma 1999  . Peripheral arterial occlusive disease   . Neuropathic foot ulcer   . DOE (dyspnea on exertion)   . PAH (pulmonary artery hypertension)   . Epistaxis   . Pulmonary hypertension     Current Outpatient Prescriptions  Medication Sig Dispense Refill  . acidophilus (RISAQUAD) CAPS Take 1 capsule by mouth every morning.       .[name] [name]  aspirin 81 MG tablet Take 81 mg by mouth every evening.       . bosentan (TRACLEER) 125 MG tablet Take 1 tablet (125 mg total) by mouth 2 (two) times daily.  60 tablet  3  . cholecalciferol (VITAMIN D) 1000 UNITS tablet Take 2,000 Units by mouth every evening.       . citalopram (CELEXA) 20 MG tablet take 1 tablet by mouth once daily  30 tablet  11  . clopidogrel (PLAVIX) 75 MG tablet take 1 tablet by mouth once daily  30 tablet  12  . gabapentin (NEURONTIN) 300 MG capsule take 1 capsule by mouth every morning and 2 every evening  90 capsule  11  . moxifloxacin (VIGAMOX) 0.5 % ophthalmic solution Place 1 drop into both eyes 3 (three) times daily.      . multivitamin (THERAGRAN) per tablet Take 2 tablets by mouth 2 (two) times daily.       . pantoprazole (PROTONIX) 40 MG tablet take 1 tablet by mouth once daily  30 tablet  3  . predniSONE  (DELTASONE) 5 MG tablet take 1 tablet by mouth once daily  30 tablet  2  . sildenafil (REVATIO) 20 MG tablet Take 2 tablets (40 mg total) by mouth 3 (three) times daily.  270 tablet  6  . spironolactone (ALDACTONE) 25 MG tablet take 1 tablet by mouth once daily  30 tablet  5  . torsemide (DEMADEX) 20 MG tablet Take 2 tablets (40 mg total) by mouth 2 (two) times daily.  120 tablet  3  . Treprostinil (TYVASO) 0.6 MG/ML SOLN Inhale 18 mcg into the lungs 4 (four) times daily.  3.6 mL  6  . vitamin B-12 (CYANOCOBALAMIN) 500 MCG tablet Take 500 mcg by mouth every morning.       . fish oil-omega-3 fatty acids 1000 MG capsule Take 1 g by mouth daily with lunch.        No current facility-administered medications for this encounter.    Allergies  Allergen Reactions  . Sulfa Antibiotics     Severe rash with significant peeling of face. No airway involvement per patient  . Levaquin [Levofloxacin In D5w] Other (See Comments)    Other reaction(s): Other (See Comments) FLU LIKE SYMPTOM  . Penicillins Rash     Filed Vitals:   10/26/14 1203  BP: 142/66  Pulse: 66  Weight: 135 lb 12.8 oz (61.598 kg)  SpO2: 98%    PHYSICAL EXAM: General: No acute distress No respiratory difficulty HEENT: normal Neck: supple. JVP 6. Carotids 2+ bilat; no bruits. No lymphadenopathy or thryomegaly appreciated. Cor: PMI nondisplaced. Regular rate & rhythm. Prominent P2. No murmur. + RV lift Lungs: clear on 4 liters [place] Abdomen: soft, nontender, nondistended. No hepatosplenomegaly. No bruits or masses. Good bowel sounds. 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 + gouty nodules. LEs wrapped due to ulcers.  1+ ankle edema.  Neuro: alert & oriented x 3, cranial nerves grossly intact. moves all 4 extremities w/o difficulty. Affect pleasant.  ASSESSMENT & PLAN:  1. 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  Unable to tolerate IV epoprostenol and now on triple therapy with bosentan,  sildenafil, and Tyvaso.  WHO class II dyspnea.  Echo today shows moderately dilated and moderately dysfunctional RV with persistent severe pulmonary hypertension.   - Bosentan is very expensive and she does not like having to get LFTs done monthly.  I will look into whether Opsumit would be any less expensive  for her.  She would not have to have her LFTs checked monthly.   - Continue sildenafil.  - Patient does not think Tyvaso has helped at all and wants to stop it.  I told her that she could stop it for a week.  If she feels worse, she will need to restart Tyvaso.  If she feels better, she can stay off Tyvaso and followup here for a 6 minute walk in 2 wks.  If the 6 minute walk is worsened, she will need to go back on Tyvaso.  - She is not volume overloaded on exam.  Continue current torsemide.  - RTC in 3 months with 6MW and echo - 6 minute walk today.  Will check oxygen saturation while she is walking on 2L [place].  If oxygen saturation stays > 90%, she can stay on 2L [place] and get a portable oxygen tankl.  2. Scleroderma: Followed by Dr. Amil Amen.  3. Chronic right calf ulceration: ABIs stable. Followed by Wound Care.   4. PAD:  ABIs stable.  She is on Plavix.    Jammi Morrissette,MD 10/26/2014

## 2014-10-27 NOTE — Progress Notes (Signed)
LATE ENTRY:  6 min walk test completed 10/17 at OV, pt ambulated 930 ft on 2 L O2 via [place], O2 sats ranged from 91-98% on 2 L, HR ranged from 85-105

## 2014-10-27 NOTE — Addendum Note (Signed)
Encounter addended by: Scarlette Calico, RN on: 10/27/2014  1:53 PM<BR>     Documentation filed: Notes Section

## 2014-10-28 DIAGNOSIS — I872 Venous insufficiency (chronic) (peripheral): Secondary | ICD-10-CM | POA: Diagnosis not present

## 2014-10-28 DIAGNOSIS — L97929 Non-pressure chronic ulcer of unspecified part of left lower leg with unspecified severity: Secondary | ICD-10-CM | POA: Diagnosis not present

## 2014-11-08 ENCOUNTER — Other Ambulatory Visit: Payer: Self-pay | Admitting: Geriatric Medicine

## 2014-11-08 MED ORDER — PANTOPRAZOLE SODIUM 40 MG PO TBEC: DELAYED_RELEASE_TABLET | ORAL | Status: DC

## 2014-11-11 ENCOUNTER — Encounter (HOSPITAL_BASED_OUTPATIENT_CLINIC_OR_DEPARTMENT_OTHER): Payer: BC Managed Care – PPO

## 2014-11-12 ENCOUNTER — Encounter (HOSPITAL_BASED_OUTPATIENT_CLINIC_OR_DEPARTMENT_OTHER): Payer: BC Managed Care – PPO | Attending: Internal Medicine

## 2014-11-12 DIAGNOSIS — I87331 Chronic venous hypertension (idiopathic) with ulcer and inflammation of right lower extremity: Secondary | ICD-10-CM | POA: Diagnosis not present

## 2014-11-12 DIAGNOSIS — L97819 Non-pressure chronic ulcer of other part of right lower leg with unspecified severity: Secondary | ICD-10-CM | POA: Insufficient documentation

## 2014-11-15 ENCOUNTER — Telehealth (HOSPITAL_COMMUNITY): Payer: Self-pay | Admitting: Vascular Surgery

## 2014-11-15 ENCOUNTER — Other Ambulatory Visit: Payer: BC Managed Care – PPO

## 2014-11-15 DIAGNOSIS — I279 Pulmonary heart disease, unspecified: Secondary | ICD-10-CM

## 2014-11-15 LAB — HEPATIC FUNCTION PANEL
ALT: 22 U/L (ref 0–35)
AST: 33 U/L (ref 0–37)
Albumin: 4 g/dL (ref 3.5–5.2)
Alkaline Phosphatase: 142 U/L — ABNORMAL HIGH (ref 39–117)
Bilirubin, Direct: 0 mg/dL (ref 0.0–0.3)
Total Bilirubin: 0.7 mg/dL (ref 0.2–1.2)
Total Protein: 8 g/dL (ref 6.0–8.3)

## 2014-11-15 NOTE — Telephone Encounter (Signed)
Pt doing well off Tyvaso, will let Dr Aundra Dubin know

## 2014-11-15 NOTE — Telephone Encounter (Signed)
[place], stay off.

## 2014-11-15 NOTE — Telephone Encounter (Signed)
Pt called she was told to stop Tivaso 3 weeks ago.. She is doing well .Marland Kitchen She wanted Dr. Aundra Dubin to know.. Please call pt about her  O2.. please advise

## 2014-11-15 NOTE — Telephone Encounter (Signed)
Pt aware and will remain off tyvasso Pt also inquired about order for small/concnetrated oxygen tank, order sent into [facility]

## 2014-11-15 NOTE — Telephone Encounter (Signed)
Left message to call back

## 2014-11-16 ENCOUNTER — Other Ambulatory Visit (HOSPITAL_COMMUNITY): Payer: Self-pay | Admitting: Cardiology

## 2014-11-16 DIAGNOSIS — I272 Pulmonary hypertension, unspecified: Secondary | ICD-10-CM

## 2014-11-18 ENCOUNTER — Other Ambulatory Visit (HOSPITAL_COMMUNITY): Payer: Self-pay | Admitting: Orthopedic Surgery

## 2014-11-18 ENCOUNTER — Other Ambulatory Visit: Payer: Self-pay | Admitting: Emergency Medicine

## 2014-11-18 ENCOUNTER — Telehealth (HOSPITAL_COMMUNITY): Payer: Self-pay | Admitting: Vascular Surgery

## 2014-11-18 NOTE — Telephone Encounter (Signed)
Left detailed mess on ID VM that medication was stopped by the MD, call back for further questions

## 2014-11-18 NOTE — Telephone Encounter (Signed)
Wells Guiles from [facility] called ... Pt stopped her Tyvaso... Is that correct ? and does the prescription need to be D/C.Marland Kitchen PLEASE ADVISE

## 2014-11-19 DIAGNOSIS — I87331 Chronic venous hypertension (idiopathic) with ulcer and inflammation of right lower extremity: Secondary | ICD-10-CM | POA: Diagnosis not present

## 2014-11-19 DIAGNOSIS — L97819 Non-pressure chronic ulcer of other part of right lower leg with unspecified severity: Secondary | ICD-10-CM | POA: Diagnosis not present

## 2014-11-24 ENCOUNTER — Telehealth: Payer: Self-pay | Admitting: Emergency Medicine

## 2014-11-24 MED ORDER — PREDNISONE 5 MG PO TABS: 5.0000 mg | ORAL_TABLET | Freq: Every day | ORAL | Status: DC

## 2014-11-24 NOTE — Telephone Encounter (Signed)
Called and spoke to pt. Pt requesting pred refill. Rx sent to preferred pharmacy. Pt also overdue for appt. Appt made for 01/04/15 with RB. Pt verbalized understanding and denied any further questions or concerns at this time.

## 2014-11-30 ENCOUNTER — Other Ambulatory Visit (HOSPITAL_COMMUNITY): Payer: Self-pay | Admitting: Orthopedic Surgery

## 2014-12-02 ENCOUNTER — Other Ambulatory Visit (HOSPITAL_COMMUNITY): Payer: Self-pay | Admitting: Orthopedic Surgery

## 2014-12-02 DIAGNOSIS — D2111 Benign neoplasm of connective and other soft tissue of right upper limb, including shoulder: Secondary | ICD-10-CM

## 2014-12-03 ENCOUNTER — Encounter (HOSPITAL_BASED_OUTPATIENT_CLINIC_OR_DEPARTMENT_OTHER): Payer: BLUE CROSS/BLUE SHIELD | Attending: Internal Medicine

## 2014-12-03 DIAGNOSIS — I87301 Chronic venous hypertension (idiopathic) without complications of right lower extremity: Secondary | ICD-10-CM | POA: Insufficient documentation

## 2014-12-03 DIAGNOSIS — L97919 Non-pressure chronic ulcer of unspecified part of right lower leg with unspecified severity: Secondary | ICD-10-CM | POA: Diagnosis not present

## 2014-12-09 ENCOUNTER — Encounter (HOSPITAL_COMMUNITY): Payer: Self-pay | Admitting: Internal Medicine

## 2014-12-10 DIAGNOSIS — L97919 Non-pressure chronic ulcer of unspecified part of right lower leg with unspecified severity: Secondary | ICD-10-CM | POA: Diagnosis not present

## 2014-12-10 DIAGNOSIS — I87301 Chronic venous hypertension (idiopathic) without complications of right lower extremity: Secondary | ICD-10-CM | POA: Diagnosis not present

## 2014-12-13 ENCOUNTER — Other Ambulatory Visit: Payer: BC Managed Care – PPO

## 2014-12-13 DIAGNOSIS — I279 Pulmonary heart disease, unspecified: Secondary | ICD-10-CM

## 2014-12-13 LAB — HEPATIC FUNCTION PANEL
ALT: 19 U/L (ref 0–35)
AST: 30 U/L (ref 0–37)
Albumin: 4.1 g/dL (ref 3.5–5.2)
Alkaline Phosphatase: 124 U/L — ABNORMAL HIGH (ref 39–117)
Bilirubin, Direct: 0 mg/dL (ref 0.0–0.3)
Total Bilirubin: 0.5 mg/dL (ref 0.2–1.2)
Total Protein: 7.8 g/dL (ref 6.0–8.3)

## 2014-12-14 ENCOUNTER — Ambulatory Visit (HOSPITAL_COMMUNITY)
Admission: RE | Admit: 2014-12-14 | Discharge: 2014-12-14 | Disposition: A | Discharge status: Home / Self Care | Payer: BC Managed Care – PPO | Source: Ambulatory Visit | Attending: Orthopedic Surgery | Admitting: Orthopedic Surgery

## 2014-12-14 DIAGNOSIS — Z8572 Personal history of non-Hodgkin lymphomas: Secondary | ICD-10-CM | POA: Insufficient documentation

## 2014-12-14 DIAGNOSIS — Z85820 Personal history of malignant melanoma of skin: Secondary | ICD-10-CM | POA: Diagnosis not present

## 2014-12-14 DIAGNOSIS — D2111 Benign neoplasm of connective and other soft tissue of right upper limb, including shoulder: Secondary | ICD-10-CM

## 2014-12-14 DIAGNOSIS — R2231 Localized swelling, mass and lump, right upper limb: Secondary | ICD-10-CM | POA: Diagnosis not present

## 2014-12-14 DIAGNOSIS — M12841 Other specific arthropathies, not elsewhere classified, right hand: Secondary | ICD-10-CM | POA: Insufficient documentation

## 2014-12-14 DIAGNOSIS — M7989 Other specified soft tissue disorders: Secondary | ICD-10-CM | POA: Diagnosis present

## 2014-12-16 DIAGNOSIS — L97919 Non-pressure chronic ulcer of unspecified part of right lower leg with unspecified severity: Secondary | ICD-10-CM | POA: Diagnosis not present

## 2014-12-16 DIAGNOSIS — I87301 Chronic venous hypertension (idiopathic) without complications of right lower extremity: Secondary | ICD-10-CM | POA: Diagnosis not present

## 2014-12-30 DIAGNOSIS — I87301 Chronic venous hypertension (idiopathic) without complications of right lower extremity: Secondary | ICD-10-CM | POA: Diagnosis not present

## 2014-12-30 DIAGNOSIS — L97919 Non-pressure chronic ulcer of unspecified part of right lower leg with unspecified severity: Secondary | ICD-10-CM | POA: Diagnosis not present

## 2015-01-04 ENCOUNTER — Ambulatory Visit (INDEPENDENT_AMBULATORY_CARE_PROVIDER_SITE_OTHER): Payer: BLUE CROSS/BLUE SHIELD | Admitting: Emergency Medicine

## 2015-01-04 ENCOUNTER — Encounter: Payer: Self-pay | Admitting: Emergency Medicine

## 2015-01-04 VITALS — BP 142/62 | HR 64 | Ht 68.0 in | Wt 135.0 lb

## 2015-01-04 DIAGNOSIS — I272 Other secondary pulmonary hypertension: Secondary | ICD-10-CM

## 2015-01-04 DIAGNOSIS — I2729 Other secondary pulmonary hypertension: Secondary | ICD-10-CM

## 2015-01-04 DIAGNOSIS — R69 Illness, unspecified: Secondary | ICD-10-CM

## 2015-01-04 NOTE — Assessment & Plan Note (Signed)
Please continue your Tracleer and Revatio for now. Discuss with Dr [name] and Aundra Dubin the possibility of an alternative to Tracleer (for example Letaris) Follow with Dr Lamonte Sakai in 2 months to discuss possibly transitioning from prednisone to Cellcept.

## 2015-01-04 NOTE — Progress Notes (Signed)
Subjective:    [name], female    DOB: 02/08/1951, 64 y.o.   MRN: [id number] HPI 64 yo woman, never smoker, hx of scleroderma, Raynaud's, non-Hodgkins lymphoma (treated '10), HTN, PAH originally dx in [place] and treated, but meds d/c'd when she improved. Has been seen in by Dr [name], had R heart cath as below in 03/2012. She has been restarted on therapy, seen both in [place] and at [facility] >> Tracleer + Revatio + Veletri (since 2/'14). She has felt worse on IV therapy, progressive as it titrated up.  Dr Gilles Chiquito scaled back her veletri for this reason. She has had progressive dyspnea - was just started prednisone 34m qd. She has benefited significantly, better exertional tolerance and SpO2.    Underwent RHC on 04/18/12 as below:  RA = 9  RV = 75/11/14  PA = 71/25 (44)  PCW = 4  Fick cardiac output/index = 3.0/1.7  Thermo CO/CI = 2.7/1.5  PVR = 23.5 Woods  FA sat = 79% on room air (checked 3 times). Sat with finger probe on air 94%  PA sat = 41%, 43%  MW 04/18/12 880 feet  ROV 06/24/13 -- f/u for scleroderma, Raynaud's, non-Hodgkins lymphoma (treated '10), HTN, PAH on IV therapy. 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 Aspirating).  She states that she has felt much better with good functional capacity, less dyspnea, no CP since she started prednisone. No overt aspiration sx although she is high risk. [facility]   ROV 07/24/13 -- scleroderma, Raynaud's, non-Hodgkins lymphoma (treated '10), HTN, PAH on IV therapy. Last time we initiated a pred taper, currently on 268m She has noticed a bit of lightheadedness. She saw Dr FoGilles Chiquitoarly July - her O2 was decreased to 4L/min w exertion. Her former maintenance pred dose was 72m2md.   ROV 08/27/13 -- scleroderma, Raynaud's, non-Hodgkins lymphoma (treated '10), HTN, PAH on IV therapy. We have been tapering pred, currently at 55m80md. She has been interested in getting off Veletri. We have planned to repeat R heart cath when we get to stable pred dosing. Breathing is stable. Continues to have occasional Reynaud's, not really increased freq.   ROV 10/08/13 -- scleroderma, Raynaud's, non-Hodgkins lymphoma (treated '10), HTN, PAH on IV therapy. We have been tapering pred, last time decreased to 72mg.46me appears to have tolerated this. No new issues with breathing, CP. Edema is stable. No syncope or pre-syncope.   ROV 01/06/14 -- scleroderma, Raynaud's, non-Hodgkins lymphoma (treated '10), HTN, PAH previously on IV therapy >> she has [facility]. Current dose is 6 puffs qid. Also on bosentan and sildenifil. She has also been treated for a MRSA R leg ulcer with linezolid, planned for 10 days. She had to stop celexa when the linezolid caused QTc prolongation. She is on Pred 72mg, 64mble scleroderma dose. She saw Dr BeekmaAmil Amenlanning to follow with him.   Breathing is doing well. Doesn't feel that she lost much ground since she changed from IV therapy. No real edema. No syncope or presyncope. No HA's or visual changes.   ROV 01/04/15 -- follow up visit for scleroderma, secondary PAH. We had changed her Veletri to Tyvaso. She has since been taken off Tyvaso. She hasn't seemed to lose any ground off this. Remains on bosentan and tracleer. Also on chronic  prednisone 63m. She wonders about starting cellcept and getting off the prednisone. She also wonders about a substitute for bosentan.      Review of Systems  Constitutional: Negative for fever, appetite change and unexpected weight change.  HENT: Negative for congestion, dental problem, ear pain, nosebleeds, postnasal drip, rhinorrhea, sinus pressure, sneezing, sore throat and trouble swallowing.   Eyes: Negative for redness and itching.  Respiratory: Positive for shortness of breath. Negative for cough, chest tightness and wheezing.    Cardiovascular: Negative for palpitations and leg swelling.  Gastrointestinal: Negative for nausea and vomiting.  Genitourinary: Negative for dysuria.  Musculoskeletal: Negative for joint swelling.  Skin: Negative for rash.  Neurological: Negative for headaches.  Hematological: Does not bruise/bleed easily.  Psychiatric/Behavioral: Negative for dysphoric mood. The patient is not nervous/anxious.        Objective:   Physical Exam Filed Vitals:   01/04/15 1649  BP: 142/62  Pulse: 64  Height: _0  (1.727 m)  Weight: 135 lb (61.236 kg)  SpO2: 94%   Gen: Pleasant, thin, in no distress,  normal affect on high flow O2  ENT: No lesions,  mouth clear,  oropharynx clear, no postnasal drip  Neck: No JVD, no TMG, no carotid bruits  Lungs: No use of accessory muscles, few bibasilar insp crackles  Cardiovascular: RRR, heart sounds normal, no murmur or gallops, no peripheral edema  Musculoskeletal: No deformities, no cyanosis or clubbing  Neuro: alert, non focal  Skin: significant changes from scleroderma on arms, hands, fingers      Assessment & Plan:  Pulmonary hypertension associated with systemic disorder Please continue your Tracleer and Revatio for now. Discuss with Dr BHaroldine Lawsand MAundra Dubinthe possibility of an alternative to Tracleer (for example Letaris) Follow with Dr BLamonte Sakaiin 2 months to discuss possibly transitioning from prednisone to Cellcept.

## 2015-01-04 NOTE — Patient Instructions (Signed)
Please continue your Tracleer and Revatio for now. Discuss with Dr Bensimhon and [place] the possibility of an alternative to Tracleer (for example Letaris) Follow with Dr Luvenia Cranford in 2 months to discuss possibly transitioning from prednisone to Cellcept.  

## 2015-01-05 ENCOUNTER — Other Ambulatory Visit: Payer: Self-pay | Admitting: Internal Medicine

## 2015-01-10 ENCOUNTER — Telehealth: Payer: Self-pay | Admitting: Internal Medicine

## 2015-01-10 ENCOUNTER — Other Ambulatory Visit: Payer: Self-pay | Admitting: Geriatric Medicine

## 2015-01-10 MED ORDER — CLOPIDOGREL BISULFATE 75 MG PO TABS: 75.0000 mg | ORAL_TABLET | Freq: Every day | ORAL | Status: DC

## 2015-01-10 NOTE — Telephone Encounter (Signed)
Sent to pharmacy 

## 2015-01-10 NOTE — Telephone Encounter (Signed)
Pt states she is out of plavix, pharmacy sent request no response  [facility]

## 2015-01-11 ENCOUNTER — Telehealth: Payer: Self-pay | Admitting: Internal Medicine

## 2015-01-13 ENCOUNTER — Encounter (HOSPITAL_BASED_OUTPATIENT_CLINIC_OR_DEPARTMENT_OTHER): Payer: BLUE CROSS/BLUE SHIELD | Attending: Internal Medicine

## 2015-01-13 ENCOUNTER — Other Ambulatory Visit: Payer: BLUE CROSS/BLUE SHIELD

## 2015-01-13 DIAGNOSIS — I279 Pulmonary heart disease, unspecified: Secondary | ICD-10-CM

## 2015-01-13 DIAGNOSIS — L97812 Non-pressure chronic ulcer of other part of right lower leg with fat layer exposed: Secondary | ICD-10-CM | POA: Diagnosis not present

## 2015-01-13 DIAGNOSIS — I878 Other specified disorders of veins: Secondary | ICD-10-CM | POA: Insufficient documentation

## 2015-01-13 LAB — HEPATIC FUNCTION PANEL
ALT: 15 U/L (ref 0–35)
AST: 24 U/L (ref 0–37)
Albumin: 4.3 g/dL (ref 3.5–5.2)
Alkaline Phosphatase: 101 U/L (ref 39–117)
Bilirubin, Direct: 0.1 mg/dL (ref 0.0–0.3)
Total Bilirubin: 0.5 mg/dL (ref 0.2–1.2)
Total Protein: 7.9 g/dL (ref 6.0–8.3)

## 2015-01-17 NOTE — Telephone Encounter (Signed)
Pt called to check up on this request.

## 2015-01-17 NOTE — Telephone Encounter (Signed)
Patient aware and will go pick up rx

## 2015-01-17 NOTE — Telephone Encounter (Signed)
Sent in

## 2015-01-20 DIAGNOSIS — I878 Other specified disorders of veins: Secondary | ICD-10-CM | POA: Diagnosis not present

## 2015-01-20 DIAGNOSIS — L97812 Non-pressure chronic ulcer of other part of right lower leg with fat layer exposed: Secondary | ICD-10-CM | POA: Diagnosis not present

## 2015-01-25 ENCOUNTER — Telehealth (HOSPITAL_COMMUNITY): Payer: Self-pay | Admitting: Vascular Surgery

## 2015-01-25 ENCOUNTER — Other Ambulatory Visit (HOSPITAL_COMMUNITY): Payer: Self-pay

## 2015-01-25 MED ORDER — SILDENAFIL CITRATE 20 MG PO TABS: 40.0000 mg | ORAL_TABLET | Freq: Three times a day (TID) | ORAL | Status: DC

## 2015-01-25 NOTE — Telephone Encounter (Signed)
Refill Torsemide 20 mg

## 2015-01-26 MED ORDER — TORSEMIDE 20 MG PO TABS: 40.0000 mg | ORAL_TABLET | Freq: Two times a day (BID) | ORAL | Status: DC

## 2015-01-27 DIAGNOSIS — I878 Other specified disorders of veins: Secondary | ICD-10-CM | POA: Diagnosis not present

## 2015-01-27 DIAGNOSIS — L97812 Non-pressure chronic ulcer of other part of right lower leg with fat layer exposed: Secondary | ICD-10-CM | POA: Diagnosis not present

## 2015-02-03 ENCOUNTER — Encounter (HOSPITAL_BASED_OUTPATIENT_CLINIC_OR_DEPARTMENT_OTHER): Payer: BLUE CROSS/BLUE SHIELD | Attending: Internal Medicine

## 2015-02-03 DIAGNOSIS — I87331 Chronic venous hypertension (idiopathic) with ulcer and inflammation of right lower extremity: Secondary | ICD-10-CM | POA: Diagnosis not present

## 2015-02-03 DIAGNOSIS — L97812 Non-pressure chronic ulcer of other part of right lower leg with fat layer exposed: Secondary | ICD-10-CM | POA: Insufficient documentation

## 2015-02-08 ENCOUNTER — Other Ambulatory Visit (HOSPITAL_COMMUNITY): Payer: Self-pay | Admitting: Cardiology

## 2015-02-08 MED ORDER — SILDENAFIL CITRATE 20 MG PO TABS: 40.0000 mg | ORAL_TABLET | Freq: Three times a day (TID) | ORAL | Status: DC

## 2015-02-10 ENCOUNTER — Other Ambulatory Visit: Payer: BLUE CROSS/BLUE SHIELD

## 2015-02-10 DIAGNOSIS — L97812 Non-pressure chronic ulcer of other part of right lower leg with fat layer exposed: Secondary | ICD-10-CM | POA: Diagnosis not present

## 2015-02-10 DIAGNOSIS — I87331 Chronic venous hypertension (idiopathic) with ulcer and inflammation of right lower extremity: Secondary | ICD-10-CM | POA: Diagnosis not present

## 2015-02-10 DIAGNOSIS — I279 Pulmonary heart disease, unspecified: Secondary | ICD-10-CM

## 2015-02-10 LAB — HEPATIC FUNCTION PANEL
ALT: 18 U/L (ref 0–35)
AST: 26 U/L (ref 0–37)
Albumin: 4.2 g/dL (ref 3.5–5.2)
Alkaline Phosphatase: 109 U/L (ref 39–117)
Bilirubin, Direct: 0 mg/dL (ref 0.0–0.3)
Total Bilirubin: 0.4 mg/dL (ref 0.2–1.2)
Total Protein: 7.7 g/dL (ref 6.0–8.3)

## 2015-02-15 ENCOUNTER — Other Ambulatory Visit: Payer: Self-pay | Admitting: Emergency Medicine

## 2015-02-18 ENCOUNTER — Encounter (HOSPITAL_COMMUNITY): Payer: BLUE CROSS/BLUE SHIELD

## 2015-02-22 ENCOUNTER — Encounter (HOSPITAL_COMMUNITY): Payer: Self-pay

## 2015-02-22 ENCOUNTER — Ambulatory Visit (HOSPITAL_COMMUNITY)
Admission: RE | Admit: 2015-02-22 | Discharge: 2015-02-22 | Disposition: A | Discharge status: Home / Self Care | Payer: BLUE CROSS/BLUE SHIELD | Source: Ambulatory Visit | Attending: Internal Medicine | Admitting: Internal Medicine

## 2015-02-22 VITALS — BP 126/52 | HR 76 | Wt 138.8 lb

## 2015-02-22 DIAGNOSIS — Z7952 Long term (current) use of systemic steroids: Secondary | ICD-10-CM | POA: Diagnosis not present

## 2015-02-22 DIAGNOSIS — I272 Other secondary pulmonary hypertension: Secondary | ICD-10-CM | POA: Diagnosis not present

## 2015-02-22 DIAGNOSIS — Z7902 Long term (current) use of antithrombotics/antiplatelets: Secondary | ICD-10-CM | POA: Insufficient documentation

## 2015-02-22 DIAGNOSIS — Z881 Allergy status to other antibiotic agents status: Secondary | ICD-10-CM | POA: Diagnosis not present

## 2015-02-22 DIAGNOSIS — Z880 Allergy status to penicillin: Secondary | ICD-10-CM | POA: Insufficient documentation

## 2015-02-22 DIAGNOSIS — Z9981 Dependence on supplemental oxygen: Secondary | ICD-10-CM | POA: Insufficient documentation

## 2015-02-22 DIAGNOSIS — M349 Systemic sclerosis, unspecified: Secondary | ICD-10-CM | POA: Diagnosis not present

## 2015-02-22 DIAGNOSIS — Z7982 Long term (current) use of aspirin: Secondary | ICD-10-CM | POA: Insufficient documentation

## 2015-02-22 DIAGNOSIS — I2729 Other secondary pulmonary hypertension: Secondary | ICD-10-CM

## 2015-02-22 DIAGNOSIS — I779 Disorder of arteries and arterioles, unspecified: Secondary | ICD-10-CM

## 2015-02-22 DIAGNOSIS — Z889 Allergy status to unspecified drugs, medicaments and biological substances status: Secondary | ICD-10-CM | POA: Diagnosis not present

## 2015-02-22 DIAGNOSIS — J849 Interstitial pulmonary disease, unspecified: Secondary | ICD-10-CM | POA: Insufficient documentation

## 2015-02-22 DIAGNOSIS — I739 Peripheral vascular disease, unspecified: Secondary | ICD-10-CM | POA: Insufficient documentation

## 2015-02-22 DIAGNOSIS — R69 Illness, unspecified: Secondary | ICD-10-CM

## 2015-02-22 DIAGNOSIS — Z79899 Other long term (current) drug therapy: Secondary | ICD-10-CM | POA: Diagnosis not present

## 2015-02-22 DIAGNOSIS — L97219 Non-pressure chronic ulcer of right calf with unspecified severity: Secondary | ICD-10-CM | POA: Insufficient documentation

## 2015-02-22 DIAGNOSIS — I10 Essential (primary) hypertension: Secondary | ICD-10-CM | POA: Insufficient documentation

## 2015-02-22 DIAGNOSIS — L940 Localized scleroderma [morphea]: Secondary | ICD-10-CM

## 2015-02-22 LAB — BASIC METABOLIC PANEL
Anion gap: 9 (ref 5–15)
BUN: 33 mg/dL — ABNORMAL HIGH (ref 6–23)
CO2: 31 mmol/L (ref 19–32)
Calcium: 9.6 mg/dL (ref 8.4–10.5)
Chloride: 101 mmol/L (ref 96–112)
Creatinine, Ser: 1.12 mg/dL — ABNORMAL HIGH (ref 0.50–1.10)
GFR calc Af Amer: 58 mL/min — ABNORMAL LOW (ref >90)
GFR calc non Af Amer: 50 mL/min — ABNORMAL LOW (ref >90)
Glucose, Bld: 95 mg/dL (ref 70–99)
Potassium: 3.8 mmol/L (ref 3.5–5.1)
Sodium: 141 mmol/L (ref 135–145)

## 2015-02-22 LAB — BRAIN NATRIURETIC PEPTIDE: B Natriuretic Peptide: 303.8 pg/mL — ABNORMAL HIGH (ref 0.0–100.0)

## 2015-02-22 MED ORDER — MACITENTAN 10 MG PO TABS
10.0000 mg | ORAL_TABLET | Freq: Every day | ORAL | Status: DC
Start: 2015-02-22 — End: 2016-03-09

## 2015-02-22 MED ORDER — ATORVASTATIN CALCIUM 10 MG PO TABS: 10.0000 mg | ORAL_TABLET | Freq: Every day | ORAL | Status: DC

## 2015-02-22 NOTE — Patient Instructions (Addendum)
STOP Tracleer START Opsumit 10 mg daily START Atorvastatin 10 mg   Labs today  Your physician recommends that you schedule a follow-up appointment in: 3 months

## 2015-02-22 NOTE — Progress Notes (Signed)
[name], female   DOB: 1951-01-23, 64 y.o.   MRN: [id number] Primary Cardiologist: Dr. Haroldine [name] Pulmonologist: Dr. Lamonte [name]  HPI: [name] [name] is a 64 y.o. 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    Diagnosed with PAH in 2007 in [place]. We saw her in 2013.   RHC 04/18/12  RA = 9  RV = 75/11/14  PA = 71/25 (44)  PCW = 4  Fick cardiac output/index = 3.0/1.7  Thermo CO/CI = 2.7/1.5  PVR = 13.3 Woods  FA sat = 79% on room air (checked 3 times). Sat with finger probe on air 94%  PA sat = 41%, 43%   6 MW 04/18/12 880 feet 6 MW 11/10/12 650 feet. Limited by legs. Sats down to 85% on 5L (sats at rest 95%)   Echo 10/13 LV EF 65-70%. RV severely enlarged/HK severe TR. RVSP 49mHg  Repeat RHC 11/14/12  RA = 14  RV = 84/8/20  PA = 83/29 (50)  PCW = 10  Fick cardiac output/index = 4.5/2.5  Thermo CO/CI = 3.8/2.2  PVR = 8.9 Woods (Fick)  FA sat = 86% on 3L  PA sat = 55%, 57  [facility] on IV epoprostenol (Veletri) in 01/2013. Also on Bosentan and Revatio 40 tid.  She had intolerable side effects with Veletri.  [facility] Veletri wean and Tyvaso initiation.  She was intolerant of 12 breaths of Tyvaso but able to tolerate 6 breaths 3 times a day.   ABI 4/15: R 0.86 L 1.0   Echo (3/15) with EF 60-65%, mildly D-shaped septum, RV with mild to moderate dilation and mildly decreased systolic function, mild TR, PA systolic pressure 62 mmHg, IVC not dilated.   Echo (10/15) with EF 60-65%, D-shaped interventricular septum, moderately dilated RV with moderately decreased systolic function, PA systolic pressure 72 mmHg, IVC not dilated.   6 MW 3/15 915 feet (279 m) 6 MW 2/16 930 feet (283 m)  At last appointment, she asked about stopping Tyvaso as she feels it did not help at all.  We took her off  Tyvaso, and she thinks she actually feels a bit better.  Her 6 minute walk is stable before and after stopping Tyvaso (done today).  She is stable symptomatically and is able to walk around in stores with her oxygen without significant problem.  She has chronic lower extremity ulcers followed at the wound clinic and her legs are wrapped.  Tracleer remains very expensive for her.   Labs (3/15): K 3.9, creatinine 1.0, LFTs normal Labs (05/17/14): K 3.4 Cr 1.1 Bun 40 LFTs ok  Labs (6/15): K 4.3, creatinine 1.15 Labs (8/15): creatinine 1.4 Labs (10/15): LFTs normal Labs (2/16): LFTs normal  Review of Systems: All pertinent positives and negatives as in HPI, otherwise negative.   Past Medical History  Diagnosis Date  . History of chicken pox   . Hypertension   . Sclerodermia generalized   . Non Hodgkin's lymphoma     Treated with chemo 2010, felt to be cured.  . Melanoma 1999  . Peripheral arterial occlusive disease   . Neuropathic foot ulcer   . DOE (dyspnea on exertion)   . PAH (pulmonary artery hypertension)   . Epistaxis   . Pulmonary hypertension     Current Outpatient Prescriptions  Medication Sig  Dispense Refill  . acidophilus (RISAQUAD) CAPS Take 1 capsule by mouth every morning.     Marland Kitchen aspirin 81 MG tablet Take 81 mg by mouth every evening.     . cholecalciferol (VITAMIN D) 1000 UNITS tablet Take 2,000 Units by mouth every evening.     . citalopram (CELEXA) 20 MG tablet take 1 tablet by mouth once daily 30 tablet 11  . clopidogrel (PLAVIX) 75 MG tablet Take 1 tablet (75 mg total) by mouth daily. 30 tablet 3  . fish oil-omega-3 fatty acids 1000 MG capsule Take 1 g by mouth daily with lunch.     . gabapentin (NEURONTIN) 300 MG capsule take 1 capsule by mouth every morning and 2 every evening 90 capsule 11  . moxifloxacin (VIGAMOX) 0.5 % ophthalmic solution Place 1 drop into both eyes 3 (three) times daily.    . multivitamin (THERAGRAN) per tablet Take 2 tablets by mouth 2 (two)  times daily.     . pantoprazole (PROTONIX) 40 MG tablet take 1 tablet by mouth once daily 30 tablet 6  . predniSONE (DELTASONE) 5 MG tablet take 1 tablet by mouth once daily 30 tablet 2  . sildenafil (REVATIO) 20 MG tablet Take 2 tablets (40 mg total) by mouth 3 (three) times daily. 270 tablet 6  . spironolactone (ALDACTONE) 25 MG tablet take 1 tablet by mouth once daily 30 tablet 5  . torsemide (DEMADEX) 20 MG tablet Take 2 tablets (40 mg total) by mouth 2 (two) times daily. 120 tablet 3  . vitamin B-12 (CYANOCOBALAMIN) 500 MCG tablet Take 500 mcg by mouth every morning.     Marland Kitchen atorvastatin (LIPITOR) 10 MG tablet Take 1 tablet (10 mg total) by mouth daily. 30 tablet 3  . Macitentan (OPSUMIT) 10 MG TABS Take 10 mg by mouth daily. 30 tablet 6   No current facility-administered medications for this encounter.    Allergies  Allergen Reactions  . Sulfa Antibiotics     Severe rash with significant peeling of face. No airway involvement per patient  . Levaquin [Levofloxacin In D5w] Other (See Comments)    Other reaction(s): Other (See Comments) FLU LIKE SYMPTOM  . Penicillins Rash     Filed Vitals:   02/22/15 1133  BP: 126/52  Pulse: 76  Weight: 138 lb 12.8 oz (62.959 kg)  SpO2: 99%    PHYSICAL EXAM: General: No acute distress No respiratory difficulty HEENT: normal Neck: supple. JVP 6. Carotids 2+ bilat; no bruits. No lymphadenopathy or thryomegaly appreciated. Cor: PMI nondisplaced. Regular rate & rhythm. Prominent P2. No murmur. + RV lift Lungs: clear on 4 liters [place] Abdomen: soft, nontender, nondistended. No hepatosplenomegaly. No bruits or masses. Good bowel sounds. 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 + gouty nodules. LEs wrapped due to ulcers.  1+ ankle edema.  Neuro: alert & oriented x 3, cranial nerves grossly intact. moves all 4 extremities w/o difficulty. Affect pleasant.  ASSESSMENT & PLAN:  1. 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   Unable to tolerate IV epoprostenol and with no improvement on Tyvaso.  She stopped Tyvaso after last appointment, and 6 minute walk (done today) showed no deterioration.  She remains on Revatio and bosentan.  The bosentan is very expensive for her.  WHO class II dyspnea.  Last echo showed moderately dilated and moderately dysfunctional RV with persistent severe pulmonary hypertension.   - We will work on stopping bosentan and starting her on macitentan 10 mg daily.  If she can  get this at a reasonable price, she will not have to get monthly LFTs. Continue monthly LFTs while on bosentan.  - Continue sildenafil at current dose.  - She is not volume overloaded on exam.  Continue current torsemide. She will need BMET today.  - Return in 3 months.  - Continue home oxygen.  2. Scleroderma: Followed by Dr. Amil Amen. She is now on low dose prednisone.  3. Chronic right calf ulceration: Followed by Wound Care.   4. PAD:  Repeat ABIs in 4/16.  She is on Plavix.  She needs to start statin given vascular disease, will use atorvastatin 10 mg daily (she was on this in the past with no problems) and will get lipids/LFTs in 2 months.  5. Interstitial lung disease: In setting of scleroderma.  She is on home oxygen and followed by pulmonary.   Dalton French Ana 02/22/2015

## 2015-02-24 ENCOUNTER — Other Ambulatory Visit: Payer: Self-pay | Admitting: Geriatric Medicine

## 2015-02-24 ENCOUNTER — Telehealth: Payer: Self-pay | Admitting: Internal Medicine

## 2015-02-24 DIAGNOSIS — I87331 Chronic venous hypertension (idiopathic) with ulcer and inflammation of right lower extremity: Secondary | ICD-10-CM | POA: Diagnosis not present

## 2015-02-24 DIAGNOSIS — L97812 Non-pressure chronic ulcer of other part of right lower leg with fat layer exposed: Secondary | ICD-10-CM | POA: Diagnosis not present

## 2015-02-24 MED ORDER — CITALOPRAM HYDROBROMIDE 20 MG PO TABS: 20.0000 mg | ORAL_TABLET | Freq: Every day | ORAL | Status: DC

## 2015-02-24 NOTE — Telephone Encounter (Signed)
Sent to pharmacy

## 2015-02-24 NOTE — Telephone Encounter (Signed)
citalopram (CELEXA) 20 MG tablet [[id number]]  Patient requests refill of this RX. Dr. Benay Pillow filled electronically on 02/15/2015, but [facility] did not receive. I did call [facility] and spoke to a pharmacist to verify that they did not fill this RX. Please void and resubmit

## 2015-03-07 ENCOUNTER — Ambulatory Visit (INDEPENDENT_AMBULATORY_CARE_PROVIDER_SITE_OTHER): Payer: BLUE CROSS/BLUE SHIELD | Admitting: Internal Medicine

## 2015-03-07 ENCOUNTER — Other Ambulatory Visit: Payer: BLUE CROSS/BLUE SHIELD

## 2015-03-07 VITALS — BP 130/56 | HR 68 | Temp 97.5°F | Resp 16 | Wt 139.0 lb

## 2015-03-07 DIAGNOSIS — J3089 Other allergic rhinitis: Secondary | ICD-10-CM

## 2015-03-07 DIAGNOSIS — I83229 Varicose veins of left lower extremity with both ulcer of unspecified site and inflammation: Secondary | ICD-10-CM

## 2015-03-07 DIAGNOSIS — I83223 Varicose veins of left lower extremity with both ulcer of ankle and inflammation: Secondary | ICD-10-CM

## 2015-03-07 DIAGNOSIS — I10 Essential (primary) hypertension: Secondary | ICD-10-CM

## 2015-03-07 DIAGNOSIS — I83222 Varicose veins of left lower extremity with both ulcer of calf and inflammation: Secondary | ICD-10-CM

## 2015-03-07 DIAGNOSIS — I83225 Varicose veins of left lower extremity with both ulcer other part of foot and inflammation: Secondary | ICD-10-CM

## 2015-03-07 DIAGNOSIS — I279 Pulmonary heart disease, unspecified: Secondary | ICD-10-CM

## 2015-03-07 DIAGNOSIS — I272 Other secondary pulmonary hypertension: Secondary | ICD-10-CM

## 2015-03-07 DIAGNOSIS — I83221 Varicose veins of left lower extremity with both ulcer of thigh and inflammation: Secondary | ICD-10-CM

## 2015-03-07 DIAGNOSIS — I83224 Varicose veins of left lower extremity with both ulcer of heel and midfoot and inflammation: Secondary | ICD-10-CM

## 2015-03-07 DIAGNOSIS — I83228 Varicose veins of left lower extremity with both ulcer of other part of lower extremity and inflammation: Secondary | ICD-10-CM

## 2015-03-07 DIAGNOSIS — I2729 Other secondary pulmonary hypertension: Secondary | ICD-10-CM

## 2015-03-07 DIAGNOSIS — R69 Illness, unspecified: Secondary | ICD-10-CM

## 2015-03-07 LAB — HEPATIC FUNCTION PANEL
ALT: 16 U/L (ref 0–35)
AST: 21 U/L (ref 0–37)
Albumin: 4.5 g/dL (ref 3.5–5.2)
Alkaline Phosphatase: 86 U/L (ref 39–117)
Bilirubin, Direct: 0.1 mg/dL (ref 0.0–0.3)
Total Bilirubin: 0.4 mg/dL (ref 0.2–1.2)
Total Protein: 8.1 g/dL (ref 6.0–8.3)

## 2015-03-07 MED ORDER — MOMETASONE FUROATE 50 MCG/ACT NA SUSP: 2.0000 | Freq: Every day | NASAL | Status: DC

## 2015-03-07 NOTE — Progress Notes (Signed)
Pre visit review using our clinic review tool, if applicable. No additional management support is needed unless otherwise documented below in the visit note.

## 2015-03-07 NOTE — Patient Instructions (Addendum)
We have sent in a nose steroid that you use 2 puffs in each nostril once a day for the nose dripping called nasonex.  You can stop the gabapentin by taking 1 pill twice a day for 1 week. After that you can take 1 pill in the morning for 1 week then stop altogether.   You do not need antibiotics today and we will check the labs on you.

## 2015-03-08 ENCOUNTER — Telehealth (HOSPITAL_COMMUNITY): Payer: Self-pay | Admitting: *Deleted

## 2015-03-08 ENCOUNTER — Encounter: Payer: Self-pay | Admitting: Internal Medicine

## 2015-03-08 DIAGNOSIS — J309 Allergic rhinitis, unspecified: Secondary | ICD-10-CM | POA: Insufficient documentation

## 2015-03-08 NOTE — Assessment & Plan Note (Signed)
Appears to be healing, continues to go to the wound center.

## 2015-03-08 NOTE — Assessment & Plan Note (Signed)
Needs monthly monitoring of CMP per patient and will do that so she does not have to travel to her pulmonologist.

## 2015-03-08 NOTE — Assessment & Plan Note (Signed)
Try nasonex to see if this helps. If no improvement low threshold for antibiotics given her pulmonary hypertension.

## 2015-03-08 NOTE — Progress Notes (Signed)
   Subjective:    [name], female    DOB: 07-07-1951, 64 y.o.   MRN: [id number]  HPI The patient is a 64 YO female who is coming in for her post nasal drip. She is concerned about a sinus infection given her pulmonary hypertension her lungs have trouble clearing infections. She is having nasal dripping, denies facial tenderness. She denies fevers or chills. She is coughing some and mostly green stuff. Denies ear pain or drainage. She has been taking mucinex for it and it is improving. Has been going on 5-6 days. She would like to also do her follow up visit of her hypertension today as it was scheduled for next week. Still taking same medications, no new problems. Denies headaches, chills, chest pain or pressure.   Review of Systems  Constitutional: Negative for fever, chills, activity change and fatigue.  HENT: Positive for congestion, postnasal drip and rhinorrhea. Negative for ear discharge, ear pain, sinus pressure, sore throat and trouble swallowing.   Eyes: Negative.   Respiratory: Positive for cough. Negative for chest tightness, shortness of breath and wheezing.   Cardiovascular: Positive for leg swelling. Negative for chest pain and palpitations.  Gastrointestinal: Negative for abdominal pain, diarrhea, constipation and abdominal distention.  Skin: Positive for wound.       healing      Objective:   Physical Exam  Constitutional: She appears well-developed and well-nourished. No distress.  HENT:  Head: Normocephalic and atraumatic.  Nasal turbinates with crusting and swelling. Oropharynx with mild erythema, no purulent discharge.   Eyes: EOM are normal.  Neck: Normal range of motion.  Cardiovascular: Normal rate and regular rhythm.   Pulmonary/Chest: Effort normal and breath sounds normal. No respiratory distress. She has no wheezes. She has no rales.  Abdominal: Soft. Bowel sounds are normal. She exhibits no distension. There is no tenderness. There is no  rebound.  Skin: Skin is warm.  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 She does have ulcer on her left leg which is healing   Filed Vitals:   03/07/15 1328  BP: 130/56  Pulse: 68  Temp: 97.5 F (36.4 C)  TempSrc: Oral  Resp: 16  Weight: 139 lb (63.05 kg)      Assessment & Plan:  Visit time 25 minutes, greater than 50% of which was spent in face to face counseling.

## 2015-03-08 NOTE — Telephone Encounter (Signed)
Received authorization from [place], pt's O[employer] has been approved from 03/04/15-12/30/38

## 2015-03-08 NOTE — Assessment & Plan Note (Signed)
BP still well controlled on current regimen as reflected in the medication list. Recent BMP with stable creatinine.

## 2015-03-10 ENCOUNTER — Encounter (HOSPITAL_BASED_OUTPATIENT_CLINIC_OR_DEPARTMENT_OTHER): Payer: BLUE CROSS/BLUE SHIELD | Attending: Internal Medicine

## 2015-03-10 DIAGNOSIS — I83222 Varicose veins of left lower extremity with both ulcer of calf and inflammation: Secondary | ICD-10-CM | POA: Diagnosis not present

## 2015-03-10 DIAGNOSIS — L97821 Non-pressure chronic ulcer of other part of left lower leg limited to breakdown of skin: Secondary | ICD-10-CM | POA: Insufficient documentation

## 2015-03-15 ENCOUNTER — Ambulatory Visit: Payer: BLUE CROSS/BLUE SHIELD | Admitting: Internal Medicine

## 2015-03-17 DIAGNOSIS — L97821 Non-pressure chronic ulcer of other part of left lower leg limited to breakdown of skin: Secondary | ICD-10-CM | POA: Diagnosis not present

## 2015-03-17 DIAGNOSIS — I83222 Varicose veins of left lower extremity with both ulcer of calf and inflammation: Secondary | ICD-10-CM | POA: Diagnosis not present

## 2015-03-18 DIAGNOSIS — I83222 Varicose veins of left lower extremity with both ulcer of calf and inflammation: Secondary | ICD-10-CM | POA: Diagnosis not present

## 2015-03-22 ENCOUNTER — Ambulatory Visit: Payer: BC Managed Care – PPO | Admitting: Internal Medicine

## 2015-03-24 DIAGNOSIS — I83222 Varicose veins of left lower extremity with both ulcer of calf and inflammation: Secondary | ICD-10-CM | POA: Diagnosis not present

## 2015-03-24 DIAGNOSIS — L97821 Non-pressure chronic ulcer of other part of left lower leg limited to breakdown of skin: Secondary | ICD-10-CM | POA: Diagnosis not present

## 2015-03-31 DIAGNOSIS — I83222 Varicose veins of left lower extremity with both ulcer of calf and inflammation: Secondary | ICD-10-CM | POA: Diagnosis not present

## 2015-04-05 ENCOUNTER — Other Ambulatory Visit: Payer: BLUE CROSS/BLUE SHIELD

## 2015-04-05 LAB — HEPATIC FUNCTION PANEL
ALT: 19 U/L (ref 0–35)
AST: 25 U/L (ref 0–37)
Albumin: 4.2 g/dL (ref 3.5–5.2)
Alkaline Phosphatase: 82 U/L (ref 39–117)
Bilirubin, Direct: 0.1 mg/dL (ref 0.0–0.3)
Total Bilirubin: 0.5 mg/dL (ref 0.2–1.2)
Total Protein: 7.6 g/dL (ref 6.0–8.3)

## 2015-04-06 ENCOUNTER — Other Ambulatory Visit: Payer: Self-pay | Admitting: Dermatology

## 2015-04-07 ENCOUNTER — Encounter (HOSPITAL_BASED_OUTPATIENT_CLINIC_OR_DEPARTMENT_OTHER): Payer: BLUE CROSS/BLUE SHIELD | Attending: Internal Medicine

## 2015-04-07 DIAGNOSIS — I83222 Varicose veins of left lower extremity with both ulcer of calf and inflammation: Secondary | ICD-10-CM | POA: Diagnosis not present

## 2015-04-07 DIAGNOSIS — L97821 Non-pressure chronic ulcer of other part of left lower leg limited to breakdown of skin: Secondary | ICD-10-CM | POA: Diagnosis not present

## 2015-04-14 DIAGNOSIS — I83222 Varicose veins of left lower extremity with both ulcer of calf and inflammation: Secondary | ICD-10-CM | POA: Diagnosis not present

## 2015-04-21 ENCOUNTER — Telehealth: Payer: Self-pay | Admitting: Internal Medicine

## 2015-04-21 DIAGNOSIS — I83222 Varicose veins of left lower extremity with both ulcer of calf and inflammation: Secondary | ICD-10-CM | POA: Diagnosis not present

## 2015-04-21 DIAGNOSIS — L97821 Non-pressure chronic ulcer of other part of left lower leg limited to breakdown of skin: Secondary | ICD-10-CM | POA: Diagnosis not present

## 2015-04-21 MED ORDER — GABAPENTIN 300 MG PO CAPS: ORAL_CAPSULE | ORAL | Status: DC

## 2015-04-21 NOTE — Telephone Encounter (Signed)
Pt called in and needs a refill on her gabapentin (NEURONTIN) 300 MG capsule [[id number]].  She wants to stay at the 300 mg  [facility] on [address] [phone]

## 2015-04-21 NOTE — Telephone Encounter (Signed)
[facility]...Johny Chess

## 2015-04-22 ENCOUNTER — Telehealth: Payer: Self-pay | Admitting: Emergency Medicine

## 2015-04-22 NOTE — Telephone Encounter (Signed)
Spoke with the pt  He states that he has had wound on her leg for a while- being txed at [place] by Dr Dellia Nims She states that her wound is slow to heal and they had discussed pred may be a contributing factor  She is currently taking pred 5 mg daily and her breathing is stable  Next ov here 05/20/15  She is asking if she can lower the pred some  Please advise thanks!

## 2015-04-25 ENCOUNTER — Other Ambulatory Visit: Payer: Self-pay | Admitting: Internal Medicine

## 2015-04-25 NOTE — Telephone Encounter (Signed)
I think we can safely decrease, would not stop abruptly.  Please change her to 37m daily. Have her call [place] any new symptoms.

## 2015-04-25 NOTE — Telephone Encounter (Signed)
Attempted to contact pt. No answer. Will try back.

## 2015-04-26 MED ORDER — PREDNISONE 2 MG PO TBEC: 2.0000 mg | DELAYED_RELEASE_TABLET | Freq: Every day | ORAL | Status: DC

## 2015-04-26 NOTE — Telephone Encounter (Signed)
Called and spoke to pt. Informed pt of the recs per RB. Rx sent to preferred pharmacy. Pt verbalized understanding and denied any further questions or concerns at this time.

## 2015-04-28 DIAGNOSIS — I83222 Varicose veins of left lower extremity with both ulcer of calf and inflammation: Secondary | ICD-10-CM | POA: Diagnosis not present

## 2015-05-02 ENCOUNTER — Other Ambulatory Visit: Payer: BLUE CROSS/BLUE SHIELD

## 2015-05-02 DIAGNOSIS — I279 Pulmonary heart disease, unspecified: Secondary | ICD-10-CM

## 2015-05-02 LAB — HEPATIC FUNCTION PANEL
ALT: 17 U/L (ref 0–35)
AST: 25 U/L (ref 0–37)
Albumin: 4.2 g/dL (ref 3.5–5.2)
Alkaline Phosphatase: 83 U/L (ref 39–117)
Bilirubin, Direct: 0.1 mg/dL (ref 0.0–0.3)
Total Bilirubin: 0.5 mg/dL (ref 0.2–1.2)
Total Protein: 8.1 g/dL (ref 6.0–8.3)

## 2015-05-05 ENCOUNTER — Encounter (HOSPITAL_BASED_OUTPATIENT_CLINIC_OR_DEPARTMENT_OTHER): Payer: BLUE CROSS/BLUE SHIELD | Attending: Internal Medicine

## 2015-05-05 DIAGNOSIS — I83018 Varicose veins of right lower extremity with ulcer other part of lower leg: Secondary | ICD-10-CM | POA: Insufficient documentation

## 2015-05-05 DIAGNOSIS — L97821 Non-pressure chronic ulcer of other part of left lower leg limited to breakdown of skin: Secondary | ICD-10-CM | POA: Diagnosis not present

## 2015-05-05 DIAGNOSIS — I272 Other secondary pulmonary hypertension: Secondary | ICD-10-CM | POA: Insufficient documentation

## 2015-05-05 DIAGNOSIS — M349 Systemic sclerosis, unspecified: Secondary | ICD-10-CM | POA: Diagnosis not present

## 2015-05-10 ENCOUNTER — Other Ambulatory Visit: Payer: Self-pay | Admitting: Internal Medicine

## 2015-05-12 DIAGNOSIS — I83018 Varicose veins of right lower extremity with ulcer other part of lower leg: Secondary | ICD-10-CM | POA: Diagnosis not present

## 2015-05-19 DIAGNOSIS — L97821 Non-pressure chronic ulcer of other part of left lower leg limited to breakdown of skin: Secondary | ICD-10-CM | POA: Diagnosis not present

## 2015-05-19 DIAGNOSIS — I83018 Varicose veins of right lower extremity with ulcer other part of lower leg: Secondary | ICD-10-CM | POA: Diagnosis not present

## 2015-05-19 DIAGNOSIS — I272 Other secondary pulmonary hypertension: Secondary | ICD-10-CM | POA: Diagnosis not present

## 2015-05-19 DIAGNOSIS — M349 Systemic sclerosis, unspecified: Secondary | ICD-10-CM | POA: Diagnosis not present

## 2015-05-20 ENCOUNTER — Encounter: Payer: Self-pay | Admitting: Emergency Medicine

## 2015-05-20 ENCOUNTER — Ambulatory Visit (INDEPENDENT_AMBULATORY_CARE_PROVIDER_SITE_OTHER): Payer: BLUE CROSS/BLUE SHIELD | Admitting: Emergency Medicine

## 2015-05-20 VITALS — BP 130/54 | HR 76 | Ht 68.0 in | Wt 139.0 lb

## 2015-05-20 DIAGNOSIS — R69 Illness, unspecified: Secondary | ICD-10-CM | POA: Diagnosis not present

## 2015-05-20 DIAGNOSIS — I2729 Other secondary pulmonary hypertension: Secondary | ICD-10-CM

## 2015-05-20 DIAGNOSIS — L940 Localized scleroderma [morphea]: Secondary | ICD-10-CM

## 2015-05-20 DIAGNOSIS — I272 Other secondary pulmonary hypertension: Secondary | ICD-10-CM | POA: Diagnosis not present

## 2015-05-20 NOTE — Progress Notes (Signed)
Subjective:    [name], female    DOB: 02/08/1951, 64 y.o.   MRN: [id number] HPI 64 yo woman, never smoker, hx of scleroderma, Raynaud's, non-Hodgkins lymphoma (treated '10), HTN, PAH originally dx in [place] and treated, but meds d/c'd when she improved. Has been seen in by Dr [name], had R heart cath as below in 03/2012. She has been restarted on therapy, seen both in [place] and at [facility] >> Tracleer + Revatio + Veletri (since 2/'14). She has felt worse on IV therapy, progressive as it titrated up.  Dr Gilles Chiquito scaled back her veletri for this reason. She has had progressive dyspnea - was just started prednisone 34m qd. She has benefited significantly, better exertional tolerance and SpO2.    Underwent RHC on 04/18/12 as below:  RA = 9  RV = 75/11/14  PA = 71/25 (44)  PCW = 4  Fick cardiac output/index = 3.0/1.7  Thermo CO/CI = 2.7/1.5  PVR = 23.5 Woods  FA sat = 79% on room air (checked 3 times). Sat with finger probe on air 94%  PA sat = 41%, 43%  MW 04/18/12 880 feet  ROV 06/24/13 -- f/u for scleroderma, Raynaud's, non-Hodgkins lymphoma (treated '10), HTN, PAH on IV therapy. 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 Aspirating).  She states that she has felt much better with good functional capacity, less dyspnea, no CP since she started prednisone. No overt aspiration sx although she is high risk. [facility]   ROV 07/24/13 -- scleroderma, Raynaud's, non-Hodgkins lymphoma (treated '10), HTN, PAH on IV therapy. Last time we initiated a pred taper, currently on 66m She has noticed a bit of lightheadedness. She saw Dr FoGilles Chiquitoarly July - her O2 was decreased to 4L/min w exertion. Her former maintenance pred dose was 72m2md.   ROV 08/27/13 -- scleroderma, Raynaud's, non-Hodgkins lymphoma (treated '10), HTN, PAH on IV therapy. We have been tapering pred, currently at 55m80md. She has been interested in getting off Veletri. We have planned to repeat R heart cath when we get to stable pred dosing. Breathing is stable. Continues to have occasional Reynaud's, not really increased freq.   ROV 10/08/13 -- scleroderma, Raynaud's, non-Hodgkins lymphoma (treated '10), HTN, PAH on IV therapy. We have been tapering pred, last time decreased to 72mg.46me appears to have tolerated this. No new issues with breathing, CP. Edema is stable. No syncope or pre-syncope.   ROV 01/06/14 -- scleroderma, Raynaud's, non-Hodgkins lymphoma (treated '10), HTN, PAH previously on IV therapy >> [facility]. Current dose is 6 puffs qid. Also on bosentan and sildenifil. She has also been treated for a MRSA R leg ulcer with linezolid, planned for 10 days. She had to stop celexa when the linezolid caused QTc prolongation. She is on Pred 72mg, 64mble scleroderma dose. She saw Dr BeekmaAmil Amenlanning to follow with him.   Breathing is doing well. Doesn't feel that she lost much ground since she changed from IV therapy. No real edema. No syncope or presyncope. No HA's or visual changes.   ROV 01/04/15 -- follow up visit for scleroderma, secondary PAH. We had changed her Veletri to Tyvaso. She has since been taken off Tyvaso. She hasn't seemed to lose any ground off this. Remains on bosentan and tracleer. Also on chronic  prednisone 66m. She wonders about starting cellcept and getting off the prednisone. She also wonders about a substitute for bosentan.   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 She has been on bosentan and sildenafil; [facility] she doesn;t notice any real change. We dropped her pred from 5 to 211mand she has noted some severe fatigue 2h before its time to take the next dose. We did the decrease because she has a poorly healing wound.  She does  not desaturate.                                                                                                                                                                                                                                                                          Review of Systems  Constitutional: Negative for fever, appetite change and unexpected weight change.  HENT: Negative for congestion, dental problem, ear pain, nosebleeds, postnasal drip, rhinorrhea, sinus pressure, sneezing, sore throat and trouble swallowing.   Eyes: Negative for redness and itching.  Respiratory: Positive for shortness of breath. Negative for cough, chest tightness and wheezing.   Cardiovascular: Negative for palpitations and leg swelling.  Gastrointestinal: Negative for nausea and vomiting.  Genitourinary: Negative for dysuria.  Musculoskeletal: Negative for joint swelling.  Skin: Negative for rash.  Neurological: Negative for headaches.  Hematological: Does not bruise/bleed easily.  Psychiatric/Behavioral: Negative for dysphoric mood. The patient is not nervous/anxious.        Objective:   Physical Exam Filed Vitals:   05/20/15 1625 05/20/15 1632 05/20/15 1634  BP: 130/54    Pulse:  76   Height: _0  (1.727 m)    Weight: 139 lb (63.05 kg)    SpO2:  100% 100%   Gen: Pleasant, thin, in no distress,  normal affect on high flow O2  ENT: No lesions,  mouth clear,  oropharynx clear, no postnasal drip  Neck: No JVD, no TMG, no carotid bruits  Lungs: No use of accessory muscles, few bibasilar insp crackles  Cardiovascular: RRR, heart sounds normal, no murmur or gallops, no peripheral edema  Musculoskeletal: No deformities, no cyanosis or clubbing  Neuro: alert, non  focal  Skin: significant changes from scleroderma on arms, hands, fingers      Assessment & Plan:  Scleroderma, circumscribed or localized We came down on her prednisone from 5 mg to 2 mg daily. She is beginning to  have some severe fatigue consistent with adrenal insufficiency. I don't believe we can go any lower than 2 mg. Actually need to increase at some point in the future but I will defer for now as we are trying to enhance her wound healing.    Pulmonary hypertension associated with systemic disorder Agree with sildenifil and macetentan.

## 2015-05-20 NOTE — Assessment & Plan Note (Signed)
We came down on her prednisone from 5 mg to 2 mg daily. She is beginning to have some severe fatigue consistent with adrenal insufficiency. I don't believe we can go any lower than 2 mg. Actually need to increase at some point in the future but I will defer for now as we are trying to enhance her wound healing.

## 2015-05-20 NOTE — Patient Instructions (Signed)
Please continue your medications as you have been taking them  Continue your oxygen at all times.  Follow with Dr Lamonte Sakai in 4 months or sooner if you have any problems.

## 2015-05-20 NOTE — Assessment & Plan Note (Signed)
Agree with sildenifil and macetentan.

## 2015-05-24 ENCOUNTER — Other Ambulatory Visit (HOSPITAL_COMMUNITY): Payer: Self-pay | Admitting: *Deleted

## 2015-05-24 MED ORDER — TORSEMIDE 20 MG PO TABS: 40.0000 mg | ORAL_TABLET | Freq: Two times a day (BID) | ORAL | Status: DC

## 2015-05-26 DIAGNOSIS — I83018 Varicose veins of right lower extremity with ulcer other part of lower leg: Secondary | ICD-10-CM | POA: Diagnosis not present

## 2015-05-31 ENCOUNTER — Telehealth (HOSPITAL_COMMUNITY): Payer: Self-pay | Admitting: *Deleted

## 2015-05-31 ENCOUNTER — Other Ambulatory Visit: Payer: BLUE CROSS/BLUE SHIELD

## 2015-05-31 DIAGNOSIS — I270 Primary pulmonary hypertension: Secondary | ICD-10-CM

## 2015-05-31 LAB — HEPATIC FUNCTION PANEL
ALT: 21 U/L (ref 0–35)
AST: 30 U/L (ref 0–37)
Albumin: 4.4 g/dL (ref 3.5–5.2)
Alkaline Phosphatase: 99 U/L (ref 39–117)
Bilirubin, Direct: 0.1 mg/dL (ref 0.0–0.3)
Total Bilirubin: 0.4 mg/dL (ref 0.2–1.2)
Total Protein: 7.6 g/dL (ref 6.0–8.3)

## 2015-05-31 NOTE — Telephone Encounter (Signed)
New order for her monthly hepatic panel placed

## 2015-06-02 ENCOUNTER — Encounter (HOSPITAL_BASED_OUTPATIENT_CLINIC_OR_DEPARTMENT_OTHER): Payer: BLUE CROSS/BLUE SHIELD | Attending: Internal Medicine

## 2015-06-02 DIAGNOSIS — M349 Systemic sclerosis, unspecified: Secondary | ICD-10-CM | POA: Diagnosis not present

## 2015-06-02 DIAGNOSIS — L97811 Non-pressure chronic ulcer of other part of right lower leg limited to breakdown of skin: Secondary | ICD-10-CM | POA: Insufficient documentation

## 2015-06-02 DIAGNOSIS — I83018 Varicose veins of right lower extremity with ulcer other part of lower leg: Secondary | ICD-10-CM | POA: Diagnosis not present

## 2015-06-02 DIAGNOSIS — I272 Other secondary pulmonary hypertension: Secondary | ICD-10-CM | POA: Diagnosis not present

## 2015-06-08 DIAGNOSIS — I83018 Varicose veins of right lower extremity with ulcer other part of lower leg: Secondary | ICD-10-CM | POA: Diagnosis not present

## 2015-06-09 ENCOUNTER — Telehealth: Payer: Self-pay | Admitting: *Deleted

## 2015-06-09 NOTE — Telephone Encounter (Signed)
I called pt to see when her last mammogram was. She states she just had it done earlier today at [place]. Will call for report. Results not ready yet. I asked that a copy be sent to our office. Was advised we should receive results by 06/15/15 and if not, to call back @ [phone] for results.

## 2015-06-13 ENCOUNTER — Telehealth: Payer: Self-pay | Admitting: Emergency Medicine

## 2015-06-13 NOTE — Telephone Encounter (Signed)
Pt returning call.[name] [name] ° °

## 2015-06-13 NOTE — Telephone Encounter (Signed)
lmtcb.

## 2015-06-13 NOTE — Telephone Encounter (Signed)
Per last OV: Scleroderma, circumscribed or localized - [name] Gobble, MD at 05/20/2015 5:39 PM     Status: Written Related Problem: Scleroderma, circumscribed or localized   Expand All Collapse All   We came down on her prednisone from 5 mg to 2 mg daily. She is beginning to have some severe fatigue consistent with adrenal insufficiency. I don't believe we can go any lower than 2 mg. Actually need to increase at some point in the future but I will defer for now as we are trying to enhance her wound healing.       --  Spoke with pt. She is wanting to go back to prednisone 5 mg. She has been taking pred 2 mg and since doing so she has notice her breathing is worse at times. She is aware he is not in the office until tomorrow and is fine with this. Please advise RB thanks

## 2015-06-14 ENCOUNTER — Ambulatory Visit (HOSPITAL_COMMUNITY)
Admission: RE | Admit: 2015-06-14 | Discharge: 2015-06-14 | Disposition: A | Discharge status: Home / Self Care | Payer: BLUE CROSS/BLUE SHIELD | Source: Ambulatory Visit | Attending: Internal Medicine | Admitting: Internal Medicine

## 2015-06-14 ENCOUNTER — Telehealth: Payer: Self-pay | Admitting: Internal Medicine

## 2015-06-14 VITALS — BP 144/58 | HR 78 | Wt 140.8 lb

## 2015-06-14 DIAGNOSIS — R69 Illness, unspecified: Secondary | ICD-10-CM

## 2015-06-14 DIAGNOSIS — J849 Interstitial pulmonary disease, unspecified: Secondary | ICD-10-CM | POA: Diagnosis not present

## 2015-06-14 DIAGNOSIS — Z7982 Long term (current) use of aspirin: Secondary | ICD-10-CM | POA: Insufficient documentation

## 2015-06-14 DIAGNOSIS — Z8572 Personal history of non-Hodgkin lymphomas: Secondary | ICD-10-CM | POA: Insufficient documentation

## 2015-06-14 DIAGNOSIS — I2729 Other secondary pulmonary hypertension: Secondary | ICD-10-CM

## 2015-06-14 DIAGNOSIS — Z7902 Long term (current) use of antithrombotics/antiplatelets: Secondary | ICD-10-CM | POA: Insufficient documentation

## 2015-06-14 DIAGNOSIS — I739 Peripheral vascular disease, unspecified: Secondary | ICD-10-CM | POA: Insufficient documentation

## 2015-06-14 DIAGNOSIS — M349 Systemic sclerosis, unspecified: Secondary | ICD-10-CM | POA: Diagnosis not present

## 2015-06-14 DIAGNOSIS — Z7952 Long term (current) use of systemic steroids: Secondary | ICD-10-CM | POA: Diagnosis not present

## 2015-06-14 DIAGNOSIS — I10 Essential (primary) hypertension: Secondary | ICD-10-CM | POA: Diagnosis not present

## 2015-06-14 DIAGNOSIS — Z79899 Other long term (current) drug therapy: Secondary | ICD-10-CM | POA: Diagnosis not present

## 2015-06-14 DIAGNOSIS — Z9981 Dependence on supplemental oxygen: Secondary | ICD-10-CM | POA: Diagnosis not present

## 2015-06-14 DIAGNOSIS — I272 Other secondary pulmonary hypertension: Secondary | ICD-10-CM

## 2015-06-14 NOTE — Telephone Encounter (Signed)
Patient called regarding prescription for mometasone (NASONEX) 50 MCG/ACT nasal spray [[id number]]. She states the pharmacy sent in the request but they are needing authorization.

## 2015-06-14 NOTE — Patient Instructions (Signed)
Your physician recommends that you return for a FASTING lipid profile: at your convenience    Your physician has requested that you have an echocardiogram. Echocardiography is a painless test that uses sound waves to create images of your heart. It provides your doctor with information about the size and shape of your heart and how well your heart's chambers and valves are working. This procedure takes approximately one hour. There are no restrictions for this procedure.  Your physician recommends that you schedule a follow-up appointment in: 3 months

## 2015-06-14 NOTE — Progress Notes (Signed)
[name], female   DOB: 1951-06-08, 64 y.o.   MRN: [id number] Primary Cardiologist: Dr. [name] Pulmonologist: Dr. Lamonte Sakai  HPI: [name] is a 64 y.o. 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    Diagnosed with PAH in 2007 in [place]. We saw her in 2013.   RHC 04/18/12  RA = 9  RV = 75/11/14  PA = 71/25 (44)  PCW = 4  Fick cardiac output/index = 3.0/1.7  Thermo CO/CI = 2.7/1.5  PVR = 13.3 Woods  FA sat = 79% on room air (checked 3 times). Sat with finger probe on air 94%  PA sat = 41%, 43%   6 MW 04/18/12 880 feet 6 MW 11/10/12 650 feet. Limited by legs. Sats down to 85% on 5L (sats at rest 95%)  6 MW 3/15 915 feet (279 m) 6 MW 2/16 930 feet (283 m)  Echo 10/13 LV EF 65-70%. RV severely enlarged/HK severe TR. RVSP 94mHg Echo (10/15) with EF 60-65%, D-shaped interventricular septum, moderately dilated RV with moderately decreased systolic function, PA systolic pressure 72 mmHg, IVC not dilated.    Repeat RHC 11/14/12  RA = 14  RV = 84/8/20  PA = 83/29 (50)  PCW = 10  Fick cardiac output/index = 4.5/2.5  Thermo CO/CI = 3.8/2.2  PVR = 8.9 Woods (Fick)  FA sat = 86% on 3L  PA sat = 55%, 57  [facility] Chiquitoand started on IV epoprostenol (Veletri) in 01/2013. Also on Bosentan and Revatio 40 tid.  She had intolerable side effects with Veletri.  [facility] Veletri wean and Tyvaso initiation.  She was intolerant of 12 breaths of Tyvaso but able to tolerate 6 breaths 3 times a day. Stopped Tyvaso in 2015. In February 2016 switched bosentan to Macitentan. She is stable symptomatically and is able to walk around in stores with her oxygen without significant problem.  She has chronic lower extremity ulcers on the lerf followed at the wound clinic and her legs are wrapped.  Now on macitentan 10 and revatio  40 tid  ABI 4/15: R 0.86 L 1.0        Labs (3/15): K 3.9, creatinine 1.0, LFTs normal Labs (05/17/14): K 3.4 Cr 1.1 Bun 40 LFTs ok  Labs (6/15): K 4.3, creatinine 1.15 Labs (8/15): creatinine 1.4 Labs (10/15): LFTs normal Labs (2/16): LFTs normal  Review of Systems: All pertinent positives and negatives as in HPI, otherwise negative.   Past Medical History  Diagnosis Date  . History of chicken pox   . Hypertension   . Sclerodermia generalized   . Non Hodgkin's lymphoma     Treated with chemo 2010, felt to be cured.  . Melanoma 1999  . Peripheral arterial occlusive disease   . Neuropathic foot ulcer   . DOE (dyspnea on exertion)   . PAH (pulmonary artery hypertension)   . Epistaxis   . Pulmonary hypertension     Current Outpatient Prescriptions  Medication Sig Dispense Refill  . acidophilus (RISAQUAD) CAPS Take 1 capsule by mouth every morning.     .Marland Kitchenaspirin 81 MG tablet Take 81 mg by mouth every evening.     .Marland Kitchenatorvastatin (LIPITOR) 10 MG tablet Take 1 tablet (10 mg total) by mouth daily. 30 tablet 3  . cholecalciferol (VITAMIN D) 1000 UNITS tablet Take  2,000 Units by mouth every evening.     . citalopram (CELEXA) 20 MG tablet Take 1 tablet (20 mg total) by mouth daily. 30 tablet 3  . clopidogrel (PLAVIX) 75 MG tablet take 1 tablet by mouth once daily 30 tablet 5  . fish oil-omega-3 fatty acids 1000 MG capsule Take 1 g by mouth daily with lunch.     . gabapentin (NEURONTIN) 300 MG capsule take 1 capsule by mouth every morning and 2 every evening 90 capsule 11  . Macitentan (OPSUMIT) 10 MG TABS Take 10 mg by mouth daily. 30 tablet 6  . mometasone (NASONEX) 50 MCG/ACT nasal spray Place 2 sprays into the nose daily. 17 g 12  . moxifloxacin (VIGAMOX) 0.5 % ophthalmic solution Place 1 drop into both eyes 3 (three) times daily as needed.     . multivitamin (THERAGRAN) per tablet Take 2 tablets by mouth 2 (two) times daily.     . pantoprazole (PROTONIX) 40 MG tablet take 1  tablet by mouth once daily 30 tablet 6  . PredniSONE 2 MG TBEC Take 2 mg by mouth daily. 30 tablet 1  . sildenafil (REVATIO) 20 MG tablet Take 2 tablets (40 mg total) by mouth 3 (three) times daily. 270 tablet 6  . spironolactone (ALDACTONE) 25 MG tablet take 1 tablet by mouth once daily 30 tablet 5  . torsemide (DEMADEX) 20 MG tablet Take 2 tablets (40 mg total) by mouth 2 (two) times daily. 120 tablet 3  . vitamin B-12 (CYANOCOBALAMIN) 500 MCG tablet Take 500 mcg by mouth every morning.      No current facility-administered medications for this encounter.    Allergies  Allergen Reactions  . Sulfa Antibiotics     Severe rash with significant peeling of face. No airway involvement per patient  . Levaquin [Levofloxacin In D5w] Other (See Comments)    Other reaction(s): Other (See Comments) FLU LIKE SYMPTOM  . Penicillins Rash     Filed Vitals:   06/14/15 1509  BP: 144/58  Pulse: 78  Weight: 140 lb 12 oz (63.844 kg)  SpO2: 92%    PHYSICAL EXAM: General: No acute distress No respiratory difficulty HEENT: normal Neck: supple. JVP 6. Carotids 2+ bilat; no bruits. No lymphadenopathy or thryomegaly appreciated. Cor: PMI nondisplaced. Regular rate & rhythm. Prominent P2. No murmur. + RV lift Lungs: clear on 4 liters Orient Abdomen: soft, nontender, nondistended. No hepatosplenomegaly. No bruits or masses. Good bowel sounds. 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 + gouty nodules. LEs wrapped due to ulcers.  mild edema.  Neuro: alert & oriented x 3, cranial nerves grossly intact. moves all 4 extremities w/o difficulty. Affect pleasant.  ASSESSMENT & PLAN:  1. 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  Unable to tolerate IV epoprostenol and with no improvement on Tyvaso.  She stopped Tyvaso and 6 minute walkshowed no deterioration.  She remains on Revatio and macitentan.  WHO class II dyspnea.  Last echo showed moderately dilated and moderately dysfunctional  RV with persistent severe pulmonary hypertension.   - Continue sildenafil and macitentan at current dose.  - Will need repeat echo. If PAP pressures increasing may need to consider repeat RHC and adding selexapeg or increasing sildenafil.  - She is not volume overloaded on exam.  Continue current torsemide.  - Return in 3 months.  - Continue home oxygen.  2. Scleroderma: Followed by Dr. Amil Amen. She is now on low dose prednisone.  3. Chronic LEulceration: Followed by Wound Care.  4. PAD:  Repeat ABIs in 4/16.  She is on Plavix and atorva. Has f/u ABIs with Dr. [name] this month.  5. Interstitial lung disease: In setting of scleroderma.  She is on home oxygen and followed by pulmonary.   Lakeem Rozo,MD 06/14/2015

## 2015-06-14 NOTE — Telephone Encounter (Signed)
I agree, have her go back to pred 77m

## 2015-06-15 ENCOUNTER — Other Ambulatory Visit: Payer: Self-pay | Admitting: Geriatric Medicine

## 2015-06-15 MED ORDER — PREDNISONE 5 MG PO TABS: 5.0000 mg | ORAL_TABLET | Freq: Every day | ORAL | Status: DC

## 2015-06-15 MED ORDER — MOMETASONE FUROATE 50 MCG/ACT NA SUSP: 2.0000 | Freq: Every day | NASAL | Status: DC

## 2015-06-15 NOTE — Telephone Encounter (Signed)
I called made pt aware. Nothing further needed

## 2015-06-16 DIAGNOSIS — L97811 Non-pressure chronic ulcer of other part of right lower leg limited to breakdown of skin: Secondary | ICD-10-CM | POA: Diagnosis not present

## 2015-06-16 DIAGNOSIS — I83018 Varicose veins of right lower extremity with ulcer other part of lower leg: Secondary | ICD-10-CM | POA: Diagnosis not present

## 2015-06-16 DIAGNOSIS — M349 Systemic sclerosis, unspecified: Secondary | ICD-10-CM | POA: Diagnosis not present

## 2015-06-16 DIAGNOSIS — I272 Other secondary pulmonary hypertension: Secondary | ICD-10-CM | POA: Diagnosis not present

## 2015-06-20 ENCOUNTER — Other Ambulatory Visit (HOSPITAL_COMMUNITY): Payer: Self-pay | Admitting: *Deleted

## 2015-06-20 DIAGNOSIS — I779 Disorder of arteries and arterioles, unspecified: Secondary | ICD-10-CM

## 2015-06-20 MED ORDER — ATORVASTATIN CALCIUM 10 MG PO TABS: 10.0000 mg | ORAL_TABLET | Freq: Every day | ORAL | Status: DC

## 2015-06-21 ENCOUNTER — Other Ambulatory Visit (INDEPENDENT_AMBULATORY_CARE_PROVIDER_SITE_OTHER): Payer: BLUE CROSS/BLUE SHIELD

## 2015-06-21 DIAGNOSIS — I10 Essential (primary) hypertension: Secondary | ICD-10-CM | POA: Diagnosis not present

## 2015-06-21 LAB — LIPID PANEL
Cholesterol: 161 mg/dL (ref 0–200)
HDL: 59.2 mg/dL (ref >39.00)
LDL Cholesterol: 85 mg/dL (ref 0–99)
NonHDL: 101.8
Total CHOL/HDL Ratio: 3
Triglycerides: 85 mg/dL (ref 0.0–149.0)
VLDL: 17 mg/dL (ref 0.0–40.0)

## 2015-06-23 DIAGNOSIS — I272 Other secondary pulmonary hypertension: Secondary | ICD-10-CM | POA: Diagnosis not present

## 2015-06-23 DIAGNOSIS — M349 Systemic sclerosis, unspecified: Secondary | ICD-10-CM | POA: Diagnosis not present

## 2015-06-23 DIAGNOSIS — I83018 Varicose veins of right lower extremity with ulcer other part of lower leg: Secondary | ICD-10-CM | POA: Diagnosis not present

## 2015-06-23 DIAGNOSIS — L97811 Non-pressure chronic ulcer of other part of right lower leg limited to breakdown of skin: Secondary | ICD-10-CM | POA: Diagnosis not present

## 2015-06-28 ENCOUNTER — Other Ambulatory Visit: Payer: Self-pay | Admitting: Dermatology

## 2015-06-28 ENCOUNTER — Other Ambulatory Visit: Payer: Self-pay | Admitting: Internal Medicine

## 2015-06-30 DIAGNOSIS — I272 Other secondary pulmonary hypertension: Secondary | ICD-10-CM | POA: Diagnosis not present

## 2015-06-30 DIAGNOSIS — M349 Systemic sclerosis, unspecified: Secondary | ICD-10-CM | POA: Diagnosis not present

## 2015-06-30 DIAGNOSIS — L97811 Non-pressure chronic ulcer of other part of right lower leg limited to breakdown of skin: Secondary | ICD-10-CM | POA: Diagnosis not present

## 2015-06-30 DIAGNOSIS — I83018 Varicose veins of right lower extremity with ulcer other part of lower leg: Secondary | ICD-10-CM | POA: Diagnosis not present

## 2015-07-05 ENCOUNTER — Encounter (HOSPITAL_COMMUNITY): Payer: Self-pay | Admitting: *Deleted

## 2015-07-07 ENCOUNTER — Telehealth (HOSPITAL_COMMUNITY): Payer: Self-pay | Admitting: *Deleted

## 2015-07-07 ENCOUNTER — Encounter (HOSPITAL_BASED_OUTPATIENT_CLINIC_OR_DEPARTMENT_OTHER): Payer: BLUE CROSS/BLUE SHIELD | Attending: Internal Medicine

## 2015-07-07 ENCOUNTER — Other Ambulatory Visit: Payer: Self-pay | Admitting: *Deleted

## 2015-07-07 DIAGNOSIS — M349 Systemic sclerosis, unspecified: Secondary | ICD-10-CM | POA: Insufficient documentation

## 2015-07-07 DIAGNOSIS — I83893 Varicose veins of bilateral lower extremities with other complications: Secondary | ICD-10-CM

## 2015-07-07 DIAGNOSIS — I878 Other specified disorders of veins: Secondary | ICD-10-CM | POA: Insufficient documentation

## 2015-07-07 DIAGNOSIS — L97811 Non-pressure chronic ulcer of other part of right lower leg limited to breakdown of skin: Secondary | ICD-10-CM | POA: Insufficient documentation

## 2015-07-07 DIAGNOSIS — I272 Other secondary pulmonary hypertension: Secondary | ICD-10-CM | POA: Insufficient documentation

## 2015-07-07 NOTE — Telephone Encounter (Signed)
Received call from [facility], they state that pt's RX for Sildenafil has been transferred to [facility] due to her insurance and he just wanted [place] to be aware

## 2015-07-12 ENCOUNTER — Telehealth: Payer: Self-pay | Admitting: Internal Medicine

## 2015-07-12 ENCOUNTER — Ambulatory Visit (HOSPITAL_COMMUNITY): Admission: RE | Admit: 2015-07-12 | Payer: BLUE CROSS/BLUE SHIELD | Source: Ambulatory Visit

## 2015-07-12 ENCOUNTER — Ambulatory Visit (HOSPITAL_COMMUNITY)
Admission: RE | Admit: 2015-07-12 | Discharge: 2015-07-12 | Disposition: A | Discharge status: Home / Self Care | Payer: BLUE CROSS/BLUE SHIELD | Source: Ambulatory Visit | Attending: Internal Medicine | Admitting: Internal Medicine

## 2015-07-12 DIAGNOSIS — R69 Illness, unspecified: Secondary | ICD-10-CM | POA: Diagnosis not present

## 2015-07-12 DIAGNOSIS — I517 Cardiomegaly: Secondary | ICD-10-CM | POA: Insufficient documentation

## 2015-07-12 DIAGNOSIS — I2729 Other secondary pulmonary hypertension: Secondary | ICD-10-CM

## 2015-07-12 DIAGNOSIS — I272 Other secondary pulmonary hypertension: Secondary | ICD-10-CM | POA: Diagnosis not present

## 2015-07-12 NOTE — Progress Notes (Signed)
  Echocardiogram 2D Echocardiogram has been performed.  Darlina Sicilian M 07/12/2015, 12:07 PM

## 2015-07-12 NOTE — Telephone Encounter (Signed)
Pt called in needs refill on her spironolactone (ALDACTONE) 25 MG tablet [[id number]]   [facility] on [address]

## 2015-07-13 ENCOUNTER — Other Ambulatory Visit: Payer: Self-pay | Admitting: Geriatric Medicine

## 2015-07-13 MED ORDER — SPIRONOLACTONE 25 MG PO TABS: 25.0000 mg | ORAL_TABLET | Freq: Every day | ORAL | Status: DC

## 2015-07-13 NOTE — Telephone Encounter (Signed)
Sent to pharmacy 

## 2015-07-14 DIAGNOSIS — I878 Other specified disorders of veins: Secondary | ICD-10-CM | POA: Diagnosis not present

## 2015-07-14 DIAGNOSIS — L97811 Non-pressure chronic ulcer of other part of right lower leg limited to breakdown of skin: Secondary | ICD-10-CM | POA: Diagnosis not present

## 2015-07-14 DIAGNOSIS — I272 Other secondary pulmonary hypertension: Secondary | ICD-10-CM | POA: Diagnosis not present

## 2015-07-14 DIAGNOSIS — M349 Systemic sclerosis, unspecified: Secondary | ICD-10-CM | POA: Diagnosis not present

## 2015-07-15 ENCOUNTER — Telehealth (HOSPITAL_COMMUNITY): Payer: Self-pay | Admitting: *Deleted

## 2015-07-15 NOTE — Telephone Encounter (Signed)
revatio rx approved through [employer] (ref #[id number]) 07/13/15-12/30/2038 Faxed to pharmacy.

## 2015-07-19 ENCOUNTER — Telehealth (HOSPITAL_COMMUNITY): Payer: Self-pay | Admitting: *Deleted

## 2015-07-19 NOTE — Telephone Encounter (Signed)
Completed PA's through [facility] for pt's Revatio and Opsumit, both meds were approved 07/13/15-12/30/38

## 2015-07-21 DIAGNOSIS — I878 Other specified disorders of veins: Secondary | ICD-10-CM | POA: Diagnosis not present

## 2015-07-21 DIAGNOSIS — M349 Systemic sclerosis, unspecified: Secondary | ICD-10-CM | POA: Diagnosis not present

## 2015-07-21 DIAGNOSIS — I272 Other secondary pulmonary hypertension: Secondary | ICD-10-CM | POA: Diagnosis not present

## 2015-07-21 DIAGNOSIS — L97811 Non-pressure chronic ulcer of other part of right lower leg limited to breakdown of skin: Secondary | ICD-10-CM | POA: Diagnosis not present

## 2015-07-28 ENCOUNTER — Encounter: Payer: Self-pay | Admitting: Vascular Surgery

## 2015-07-28 DIAGNOSIS — M349 Systemic sclerosis, unspecified: Secondary | ICD-10-CM | POA: Diagnosis not present

## 2015-07-28 DIAGNOSIS — I272 Other secondary pulmonary hypertension: Secondary | ICD-10-CM | POA: Diagnosis not present

## 2015-07-28 DIAGNOSIS — I878 Other specified disorders of veins: Secondary | ICD-10-CM | POA: Diagnosis not present

## 2015-07-28 DIAGNOSIS — L97811 Non-pressure chronic ulcer of other part of right lower leg limited to breakdown of skin: Secondary | ICD-10-CM | POA: Diagnosis not present

## 2015-08-02 ENCOUNTER — Ambulatory Visit (HOSPITAL_COMMUNITY)
Admission: RE | Admit: 2015-08-02 | Discharge: 2015-08-02 | Disposition: A | Discharge status: Home / Self Care | Payer: BLUE CROSS/BLUE SHIELD | Source: Ambulatory Visit | Attending: Vascular Surgery | Admitting: Vascular Surgery

## 2015-08-02 ENCOUNTER — Ambulatory Visit (INDEPENDENT_AMBULATORY_CARE_PROVIDER_SITE_OTHER)
Admission: RE | Admit: 2015-08-02 | Discharge: 2015-08-02 | Disposition: A | Discharge status: Home / Self Care | Payer: BLUE CROSS/BLUE SHIELD | Source: Ambulatory Visit | Attending: Vascular Surgery | Admitting: Vascular Surgery

## 2015-08-02 ENCOUNTER — Ambulatory Visit (INDEPENDENT_AMBULATORY_CARE_PROVIDER_SITE_OTHER): Payer: BLUE CROSS/BLUE SHIELD | Admitting: Vascular Surgery

## 2015-08-02 ENCOUNTER — Encounter: Payer: Self-pay | Admitting: Vascular Surgery

## 2015-08-02 VITALS — BP 119/62 | HR 63 | Temp 97.7°F | Resp 18 | Ht 68.0 in | Wt 140.0 lb

## 2015-08-02 DIAGNOSIS — E785 Hyperlipidemia, unspecified: Secondary | ICD-10-CM | POA: Insufficient documentation

## 2015-08-02 DIAGNOSIS — I10 Essential (primary) hypertension: Secondary | ICD-10-CM | POA: Diagnosis not present

## 2015-08-02 DIAGNOSIS — I87311 Chronic venous hypertension (idiopathic) with ulcer of right lower extremity: Secondary | ICD-10-CM

## 2015-08-02 DIAGNOSIS — L97919 Non-pressure chronic ulcer of unspecified part of right lower leg with unspecified severity: Secondary | ICD-10-CM

## 2015-08-02 DIAGNOSIS — I83019 Varicose veins of right lower extremity with ulcer of unspecified site: Secondary | ICD-10-CM

## 2015-08-02 DIAGNOSIS — I83893 Varicose veins of bilateral lower extremities with other complications: Secondary | ICD-10-CM | POA: Diagnosis not present

## 2015-08-02 DIAGNOSIS — Z95828 Presence of other vascular implants and grafts: Secondary | ICD-10-CM | POA: Insufficient documentation

## 2015-08-02 NOTE — Progress Notes (Signed)
Patient name: [name] [name] MRN: [id number] DOB: 05-Sep-1951 Sex: female   Referred by: Dellia Nims  Reason for referral:  Chief Complaint  Patient presents with  . Follow-up    seen weekly at [facility] ,  nonhealing ulcer right calf   . Varicose Veins  . PAD    HISTORY OF PRESENT ILLNESS: Patient presents today for evaluation of right medial severe venous stasis ulcer. She has a very complex past history. Is known to me from prior treatment in 2012 2013. She initially presented with a major bleed from open erosion into her saphenous vein at her right medial malleolus. This was treated urgently in the operating room. She eventually underwent a more formal workup. Was found to have severe bilateral venous hypertension related to superficial and deep venous hypertension with reflux. She underwent staged bilateral laser ablation of her great saphenous vein. Her initial 1 week follow-up from her right leg in July 2013 showed closure up to her mid thigh. She had persistent flow in her saphenous vein from her mid thigh to her saphenofemoral junction. Her duplex follow-up on the left showed complete closure up to the saphenofemoral junction. She has multiple issues as listed below with severe pulmonary hypertension. This has remained relatively stable. She does have a 4-5 cm venous ulcer on the medial aspect of her right calf with very slow poor healing and reports several episodes of infection despite aggressive care at the wound center.  Past Medical History  Diagnosis Date  . History of chicken pox   . Hypertension   . Sclerodermia generalized   . Non Hodgkin's lymphoma     Treated with chemo 2010, felt to be cured.  . Melanoma 1999  . Peripheral arterial occlusive disease   . Neuropathic foot ulcer   . DOE (dyspnea on exertion)   . PAH (pulmonary artery hypertension)   . Epistaxis   . Pulmonary hypertension     Past Surgical History  Procedure Laterality Date  .  Rt little toe removal  10/1998  . Stent left thigh  3/11  . Tonsillectomy and adenoidectomy    . Small intestine surgery    . Biopsy on cerebellum    . Vein ligation and stripping  05/16/2012    Procedure: VEIN LIGATION AND STRIPPING;  Surgeon: [name], MD;  Location: [facility] OR;  Service: Vascular;  Laterality: Right;  Irrigation and Debridement right lower leg, ligation of saphenous vein.  . Endovenous ablation saphenous vein w/ laser  07-24-2012    right greater saphenous vein by [name], M.D.   . Endovenous ablation saphenous vein w/ laser  10-16-2012    left greater saphenous vein  by Dr. [name]  . Apligraph  12-04-2102,   12-18-2012    bilateral calves   [name]  . Tunneled venous catheter placement  01/2013  . Right heart catheterization N/A 11/14/2012    Procedure: RIGHT HEART CATH;  Surgeon: [name], MD;  Location: [facility] C[facility] LAB;  Service: Cardiovascular;  Laterality: N/A;    History   Social History  . Marital Status: Married    Spouse Name: N/A  . Number of Children: 1  . Years of Education: N/A   Occupational History  . homemaker    Social History Main Topics  . Smoking status: Never Smoker   . Smokeless tobacco: Never Used  . Alcohol Use: Yes     Comment: Occasional  . Drug Use:  No  . Sexual Activity: Not Currently   Other Topics Concern  . Not on file   Social History Narrative    Family History  Problem Relation Age of Onset  . Lupus Father   . Lymphoma Father   . Hyperlipidemia Mother   . Cancer Daughter     sarcoma  . Colon cancer      Allergies as of 08/02/2015 - Review Complete 08/02/2015  Allergen Reaction Noted  . Sulfa antibiotics  02/07/2012  . Levaquin [levofloxacin in d5w] Other (See Comments) 05/28/2013  . Penicillins Rash 08/10/2011    Current Outpatient Prescriptions on File Prior to Visit  Medication Sig Dispense Refill  . acidophilus (RISAQUAD) CAPS Take 1 capsule by mouth every morning.     Marland Kitchen  aspirin 81 MG tablet Take 81 mg by mouth every evening.     Marland Kitchen atorvastatin (LIPITOR) 10 MG tablet Take 1 tablet (10 mg total) by mouth daily. 30 tablet 3  . cholecalciferol (VITAMIN D) 1000 UNITS tablet Take 2,000 Units by mouth every evening.     . citalopram (CELEXA) 20 MG tablet take 1 tablet by mouth once daily 30 tablet 3  . clopidogrel (PLAVIX) 75 MG tablet take 1 tablet by mouth once daily 30 tablet 5  . fish oil-omega-3 fatty acids 1000 MG capsule Take 1 g by mouth daily with lunch.     . gabapentin (NEURONTIN) 300 MG capsule take 1 capsule by mouth every morning and 2 every evening 90 capsule 11  . Macitentan (OPSUMIT) 10 MG TABS Take 10 mg by mouth daily. 30 tablet 6  . mometasone (NASONEX) 50 MCG/ACT nasal spray Place 2 sprays into the nose daily. 17 g 12  . moxifloxacin (VIGAMOX) 0.5 % ophthalmic solution Place 1 drop into both eyes 3 (three) times daily as needed.     . multivitamin (THERAGRAN) per tablet Take 2 tablets by mouth 2 (two) times daily.     . pantoprazole (PROTONIX) 40 MG tablet take 1 tablet by mouth once daily 30 tablet 6  . predniSONE (DELTASONE) 5 MG tablet Take 1 tablet (5 mg total) by mouth daily with breakfast. 30 tablet 1  . sildenafil (REVATIO) 20 MG tablet Take 2 tablets (40 mg total) by mouth 3 (three) times daily. 270 tablet 6  . spironolactone (ALDACTONE) 25 MG tablet Take 1 tablet (25 mg total) by mouth daily. 30 tablet 3  . torsemide (DEMADEX) 20 MG tablet Take 2 tablets (40 mg total) by mouth 2 (two) times daily. 120 tablet 3  . vitamin B-12 (CYANOCOBALAMIN) 500 MCG tablet Take 500 mcg by mouth every morning.     . PredniSONE 2 MG TBEC Take 2 mg by mouth daily. (Patient not taking: Reported on 08/02/2015) 30 tablet 1   No current facility-administered medications on file prior to visit.     REVIEW OF SYSTEMS:  Positives indicated with an "X"  CARDIOVASCULAR:  _0  chest pain   _1  chest pressure   _2  palpitations   [x ] orthopnea   [ x] dyspnea on  exertion   _3  claudication   _4  rest pain   _5  DVT   _6  phlebitis PULMONARY:   _7  productive cough   _8  asthma   _9  wheezing NEUROLOGIC:   _10  weakness  _11  paresthesias  _12  aphasia  _13  amaurosis  _14  dizziness HEMATOLOGIC:   _15  bleeding problems   _16   clotting disorders MUSCULOSKELETAL:  _0  joint pain   _1  joint swelling GASTROINTESTINAL: _2   blood in stool  _3   hematemesis GENITOURINARY:  _4   dysuria  _5   hematuria PSYCHIATRIC:  _6  history of major depression INTEGUMENTARY:  _7  rashes  [x ] ulcers CONSTITUTIONAL:  _8  fever   _9  chills  PHYSICAL EXAMINATION:  General: The patient is a well-nourished female, in no acute distress. Vital signs are BP 119/62 mmHg  Pulse 63  Temp(Src) 97.7 F (36.5 C) (Oral)  Resp 18  Ht _10  (1.727 m)  Wt 140 lb (63.504 kg)  BMI 21.29 kg/m2 Pulmonary: There is a good air exchange. Wearing 2 L nasal cannula with no distress Musculoskeletal: Deformities in her hand related to her scleroderma. Neurologic: No focal weakness or paresthesias are detected, Skin: Large open ulcer with poor granulating base and the medial right calf Psychiatric: The patient has normal affect. Cardiovascular: Palpable radial pulses. No palpable pedal pulses bilaterally   [facility] Vascular Lab Studies:  Ordered and Independently Reviewed noninvasive studies show a falsely elevated ankle arm indices with medial calcification bilaterally. She has monophasic waveforms in her right dorsalis pedis and posterior tibial and triphasic waveforms on the left.  Venous duplex on the right reveals patency of her saphenous vein from the saphenofemoral junction to mid thigh with large tributary varicosities arising from this. She does have deep venous reflux in the common femoral vein, femoral vein, popliteal and tibial veins  Impression and Plan:  Severe venous hypertension with severe venous ulcer disease. This is related to both deep and superficial venous reflux. She  does have persistent saphenous vein reflux from her midthigh to her saphenofemoral junction. Explain the only option to improve this would be ablation of this segment of her saphenous vein. I do not feel this is related to arterial insufficiency. That she does not have completely normal flow but this does not appear to be related to arterial insufficiency. I did explain the option of ablation of her saphenous vein and she had her husband understand this and wish to proceed. We will schedule this at her earliest convenience    Jahquez Steffler [facility] Office: [phone]

## 2015-08-04 ENCOUNTER — Encounter (HOSPITAL_BASED_OUTPATIENT_CLINIC_OR_DEPARTMENT_OTHER): Payer: BLUE CROSS/BLUE SHIELD | Attending: Internal Medicine

## 2015-08-04 DIAGNOSIS — I83222 Varicose veins of left lower extremity with both ulcer of calf and inflammation: Secondary | ICD-10-CM | POA: Insufficient documentation

## 2015-08-04 DIAGNOSIS — L97821 Non-pressure chronic ulcer of other part of left lower leg limited to breakdown of skin: Secondary | ICD-10-CM | POA: Diagnosis not present

## 2015-08-05 ENCOUNTER — Other Ambulatory Visit: Payer: Self-pay | Admitting: *Deleted

## 2015-08-05 DIAGNOSIS — I83012 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: Secondary | ICD-10-CM

## 2015-08-05 DIAGNOSIS — L97219 Non-pressure chronic ulcer of right calf with unspecified severity: Principal | ICD-10-CM

## 2015-08-08 ENCOUNTER — Other Ambulatory Visit: Payer: Self-pay | Admitting: Emergency Medicine

## 2015-08-09 ENCOUNTER — Other Ambulatory Visit (HOSPITAL_COMMUNITY): Payer: Self-pay | Admitting: Orthopedic Surgery

## 2015-08-09 DIAGNOSIS — M25531 Pain in right wrist: Secondary | ICD-10-CM

## 2015-08-11 ENCOUNTER — Ambulatory Visit (HOSPITAL_COMMUNITY): Payer: BLUE CROSS/BLUE SHIELD

## 2015-08-11 DIAGNOSIS — I83222 Varicose veins of left lower extremity with both ulcer of calf and inflammation: Secondary | ICD-10-CM | POA: Diagnosis not present

## 2015-08-11 DIAGNOSIS — L97821 Non-pressure chronic ulcer of other part of left lower leg limited to breakdown of skin: Secondary | ICD-10-CM | POA: Diagnosis not present

## 2015-08-12 ENCOUNTER — Ambulatory Visit (HOSPITAL_COMMUNITY)
Admission: RE | Admit: 2015-08-12 | Discharge: 2015-08-12 | Disposition: A | Discharge status: Home / Self Care | Payer: BLUE CROSS/BLUE SHIELD | Source: Ambulatory Visit | Attending: Orthopedic Surgery | Admitting: Orthopedic Surgery

## 2015-08-12 DIAGNOSIS — M659 Synovitis and tenosynovitis, unspecified: Secondary | ICD-10-CM | POA: Diagnosis not present

## 2015-08-12 DIAGNOSIS — M25531 Pain in right wrist: Secondary | ICD-10-CM

## 2015-08-12 DIAGNOSIS — M189 Osteoarthritis of first carpometacarpal joint, unspecified: Secondary | ICD-10-CM | POA: Diagnosis not present

## 2015-08-18 DIAGNOSIS — I83222 Varicose veins of left lower extremity with both ulcer of calf and inflammation: Secondary | ICD-10-CM | POA: Diagnosis not present

## 2015-08-18 DIAGNOSIS — L97821 Non-pressure chronic ulcer of other part of left lower leg limited to breakdown of skin: Secondary | ICD-10-CM | POA: Diagnosis not present

## 2015-08-25 DIAGNOSIS — I83222 Varicose veins of left lower extremity with both ulcer of calf and inflammation: Secondary | ICD-10-CM | POA: Diagnosis not present

## 2015-08-25 DIAGNOSIS — L97821 Non-pressure chronic ulcer of other part of left lower leg limited to breakdown of skin: Secondary | ICD-10-CM | POA: Diagnosis not present

## 2015-08-31 ENCOUNTER — Encounter: Payer: Self-pay | Admitting: Vascular Surgery

## 2015-09-01 ENCOUNTER — Ambulatory Visit (INDEPENDENT_AMBULATORY_CARE_PROVIDER_SITE_OTHER): Payer: BLUE CROSS/BLUE SHIELD | Admitting: Vascular Surgery

## 2015-09-01 ENCOUNTER — Encounter: Payer: Self-pay | Admitting: Vascular Surgery

## 2015-09-01 VITALS — BP 133/58 | HR 68 | Temp 97.3°F | Resp 18 | Ht 68.0 in | Wt 140.0 lb

## 2015-09-01 DIAGNOSIS — I83012 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: Secondary | ICD-10-CM | POA: Diagnosis not present

## 2015-09-01 DIAGNOSIS — L97219 Non-pressure chronic ulcer of right calf with unspecified severity: Principal | ICD-10-CM

## 2015-09-01 HISTORY — PX: ENDOVENOUS ABLATION SAPHENOUS VEIN W/ LASER: SUR449

## 2015-09-01 NOTE — Progress Notes (Signed)
Laser Ablation Procedure    Date: 09/01/2015   [name] [name] DOB:June 13, 1951  Consent signed: Yes    Surgeon:  Dr. Sherren Mocha Early  Procedure: Laser Ablation: right Greater Saphenous Vein  BP 133/58 mmHg  Pulse 68  Temp(Src) 97.3 F (36.3 C) (Oral)  Resp 18  Ht _0  (1.727 m)  Wt 140 lb (63.504 kg)  BMI 21.29 kg/m2  SpO2 96%  Tumescent Anesthesia: 450 cc 0.9% NaCl with 50 cc Lidocaine HCL with 1% Epi and 15 cc 8.4% NaHCO3  Local Anesthesia: 3 cc Lidocaine HCL and NaHCO3 (ratio 2:1)  15 watts continuous mode        Total energy: 1672 Joules   Total time: 1:51      Patient tolerated procedure well    Description of Procedure:  After marking the course of the secondary varicosities, the patient was placed on the operating table in the supine position, and the right leg was prepped and draped in sterile fashion.   Local anesthetic was administered and under ultrasound guidance the saphenous vein was accessed with a micro needle and guide wire; then the mirco puncture sheath was placed.  A guide wire was inserted saphenofemoral junction , followed by a 5 french sheath.  The position of the sheath and then the laser fiber below the junction was confirmed using the ultrasound.  Tumescent anesthesia was administered along the course of the saphenous vein using ultrasound guidance. The patient was placed in Trendelenburg position and protective laser glasses were placed on patient and staff, and the laser was fired at 15 watts continuous mode advancing 1-65m/second for a total of 1672 joules.     Steri strip was applied and ABD pads and Ace wrap bandages were applied to right thigh and at the top of the saphenofemoral junction. Blood loss was less than 15 cc.  The patient ambulated out of the operating room having tolerated the procedure well.

## 2015-09-02 ENCOUNTER — Other Ambulatory Visit: Payer: Self-pay | Admitting: Orthopedic Surgery

## 2015-09-02 ENCOUNTER — Telehealth: Payer: Self-pay | Admitting: *Deleted

## 2015-09-02 ENCOUNTER — Encounter (HOSPITAL_BASED_OUTPATIENT_CLINIC_OR_DEPARTMENT_OTHER): Payer: BLUE CROSS/BLUE SHIELD | Attending: Internal Medicine

## 2015-09-02 DIAGNOSIS — I83222 Varicose veins of left lower extremity with both ulcer of calf and inflammation: Secondary | ICD-10-CM | POA: Insufficient documentation

## 2015-09-02 DIAGNOSIS — L97821 Non-pressure chronic ulcer of other part of left lower leg limited to breakdown of skin: Secondary | ICD-10-CM | POA: Diagnosis not present

## 2015-09-02 NOTE — Telephone Encounter (Signed)
09/02/2015  Time: 9:34 AM   Patient Name: [name] [name]  Patient of: T.F. Early  Procedure:Laser Ablation right greater saphenous vein 09-01-2015  Reached patient at home and checked  Her status  Yes    Comments/Actions Taken: Mrs. Mahrt states she has no right leg pain or swelling.  Reviewed post procedural instructions with her and reminded her of post laser ablation duplex and VV follow up with Dr. [name] on 09-08-2015.      _0 @

## 2015-09-06 ENCOUNTER — Ambulatory Visit: Payer: BLUE CROSS/BLUE SHIELD | Admitting: Vascular Surgery

## 2015-09-06 ENCOUNTER — Encounter (HOSPITAL_COMMUNITY): Payer: BLUE CROSS/BLUE SHIELD

## 2015-09-06 ENCOUNTER — Encounter: Payer: Self-pay | Admitting: Internal Medicine

## 2015-09-06 ENCOUNTER — Ambulatory Visit (INDEPENDENT_AMBULATORY_CARE_PROVIDER_SITE_OTHER): Payer: BLUE CROSS/BLUE SHIELD | Admitting: Internal Medicine

## 2015-09-06 ENCOUNTER — Other Ambulatory Visit (INDEPENDENT_AMBULATORY_CARE_PROVIDER_SITE_OTHER): Payer: BLUE CROSS/BLUE SHIELD

## 2015-09-06 VITALS — BP 140/58 | HR 60 | Temp 98.3°F | Resp 16 | Ht 68.0 in | Wt 138.1 lb

## 2015-09-06 DIAGNOSIS — I83229 Varicose veins of left lower extremity with both ulcer of unspecified site and inflammation: Secondary | ICD-10-CM

## 2015-09-06 DIAGNOSIS — Z23 Encounter for immunization: Secondary | ICD-10-CM | POA: Diagnosis not present

## 2015-09-06 DIAGNOSIS — I83224 Varicose veins of left lower extremity with both ulcer of heel and midfoot and inflammation: Secondary | ICD-10-CM

## 2015-09-06 DIAGNOSIS — E538 Deficiency of other specified B group vitamins: Secondary | ICD-10-CM

## 2015-09-06 DIAGNOSIS — I272 Other secondary pulmonary hypertension: Secondary | ICD-10-CM

## 2015-09-06 DIAGNOSIS — I270 Primary pulmonary hypertension: Secondary | ICD-10-CM

## 2015-09-06 DIAGNOSIS — I83225 Varicose veins of left lower extremity with both ulcer other part of foot and inflammation: Secondary | ICD-10-CM

## 2015-09-06 DIAGNOSIS — I10 Essential (primary) hypertension: Secondary | ICD-10-CM | POA: Diagnosis not present

## 2015-09-06 DIAGNOSIS — I83221 Varicose veins of left lower extremity with both ulcer of thigh and inflammation: Secondary | ICD-10-CM | POA: Diagnosis not present

## 2015-09-06 DIAGNOSIS — I83223 Varicose veins of left lower extremity with both ulcer of ankle and inflammation: Secondary | ICD-10-CM

## 2015-09-06 DIAGNOSIS — I2729 Other secondary pulmonary hypertension: Secondary | ICD-10-CM

## 2015-09-06 DIAGNOSIS — R69 Illness, unspecified: Secondary | ICD-10-CM

## 2015-09-06 DIAGNOSIS — I83222 Varicose veins of left lower extremity with both ulcer of calf and inflammation: Secondary | ICD-10-CM

## 2015-09-06 DIAGNOSIS — I83228 Varicose veins of left lower extremity with both ulcer of other part of lower extremity and inflammation: Secondary | ICD-10-CM

## 2015-09-06 LAB — CBC
HCT: 26.5 % — ABNORMAL LOW (ref 36.0–46.0)
Hemoglobin: 8.2 g/dL — ABNORMAL LOW (ref 12.0–15.0)
MCHC: 30.9 g/dL (ref 30.0–36.0)
MCV: 78.7 fl (ref 78.0–100.0)
Platelets: 370 10*3/uL (ref 150.0–400.0)
RBC: 3.38 Mil/uL — ABNORMAL LOW (ref 3.87–5.11)
RDW: 19 % — ABNORMAL HIGH (ref 11.5–15.5)
WBC: 8.6 10*3/uL (ref 4.0–10.5)

## 2015-09-06 LAB — COMPREHENSIVE METABOLIC PANEL
ALT: 14 U/L (ref 0–35)
AST: 22 U/L (ref 0–37)
Albumin: 4.6 g/dL (ref 3.5–5.2)
Alkaline Phosphatase: 73 U/L (ref 39–117)
BUN: 44 mg/dL — ABNORMAL HIGH (ref 6–23)
CO2: 29 mEq/L (ref 19–32)
Calcium: 9.7 mg/dL (ref 8.4–10.5)
Chloride: 97 mEq/L (ref 96–112)
Creatinine, Ser: 1.33 mg/dL — ABNORMAL HIGH (ref 0.40–1.20)
GFR: 42.43 mL/min — ABNORMAL LOW (ref >60.00)
Glucose, Bld: 103 mg/dL — ABNORMAL HIGH (ref 70–99)
Potassium: 4.1 mEq/L (ref 3.5–5.1)
Sodium: 138 mEq/L (ref 135–145)
Total Bilirubin: 0.4 mg/dL (ref 0.2–1.2)
Total Protein: 8 g/dL (ref 6.0–8.3)

## 2015-09-06 LAB — VITAMIN B12: Vitamin B-12: 1189 pg/mL — ABNORMAL HIGH (ref 211–911)

## 2015-09-06 LAB — BRAIN NATRIURETIC PEPTIDE: Pro B Natriuretic peptide (BNP): 305 pg/mL — ABNORMAL HIGH (ref 0.0–100.0)

## 2015-09-06 NOTE — Patient Instructions (Signed)
We have given you the flu and the pneumonia shot today.   We will check your labs and call you back with the results. We will also forward them to Dr. Amil Amen.

## 2015-09-06 NOTE — Progress Notes (Signed)
Pre visit review using our clinic review tool, if applicable. No additional management support is needed unless otherwise documented below in the visit note.

## 2015-09-07 ENCOUNTER — Encounter: Payer: Self-pay | Admitting: Vascular Surgery

## 2015-09-07 NOTE — Assessment & Plan Note (Signed)
Has had ablation and is going to get another. Still following with wound clinic and slowly healing. Complicated by her scleroderma which is causing healing to be delayed as well. Talked to her about good nutrition and multivitamin as part of the healing process.

## 2015-09-07 NOTE — Progress Notes (Signed)
   Subjective:    [name], female    DOB: June 26, 1951, 64 y.o.   MRN: [id number]  HPI The patient is a 64 YO female coming in for follow up. Overall she is doing well, having some more joint pains. Her rheumatologist wants to have her labs done as she recently saw them. They have not adjusted her medications but her prednisone has changed doses since last time. No significant weight change. Breathing is doing well. Still taking her medications without significant side effects. Still seeing wound center for her leg and has gotten an ablation of the veins down there and is set to have another. No signs of infection or purulent discharge on the area.   Review of Systems  Constitutional: Negative for fever, chills, activity change and fatigue.  HENT: Negative for ear discharge, ear pain, sinus pressure, sore throat and trouble swallowing.   Respiratory: Negative for cough, chest tightness, shortness of breath and wheezing.   Cardiovascular: Positive for leg swelling. Negative for chest pain and palpitations.  Gastrointestinal: Negative for abdominal pain, diarrhea, constipation and abdominal distention.  Skin: Positive for wound.       healing      Objective:   Physical Exam  Constitutional: She appears well-developed and well-nourished. No distress.  HENT:  Head: Normocephalic and atraumatic.  Eyes: EOM are normal.  Neck: Normal range of motion.  Cardiovascular: Normal rate and regular rhythm.   Pulmonary/Chest: Effort normal and breath sounds normal. No respiratory distress. She has no wheezes. She has no rales.  Abdominal: Soft. Bowel sounds are normal. She exhibits no distension. There is no tenderness. There is no rebound.  Skin: Skin is warm.  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 She does have ulcer on her  leg which is wrapped today (followed at wound center)   Filed Vitals:   09/06/15 1131  BP: 140/58  Pulse: 60  Temp: 98.3 F (36.8 C)    TempSrc: Oral  Resp: 16  Height: _0  (1.727 m)  Weight: 138 lb 1.9 oz (62.651 kg)      Assessment & Plan:  High dose flu and prevnar given at visit.

## 2015-09-07 NOTE — Assessment & Plan Note (Signed)
Checking CMP today and adjust as needed. BP at goal on spironolactone, torsemide.

## 2015-09-07 NOTE — Assessment & Plan Note (Signed)
Is stable on opsumit and checking labs today. Breathing is stable and lung function appears to be stable as well. Checking BNP for fluid status today, no signs of overload on exam.

## 2015-09-08 ENCOUNTER — Ambulatory Visit (INDEPENDENT_AMBULATORY_CARE_PROVIDER_SITE_OTHER): Payer: BLUE CROSS/BLUE SHIELD | Admitting: Vascular Surgery

## 2015-09-08 ENCOUNTER — Encounter: Payer: Self-pay | Admitting: Vascular Surgery

## 2015-09-08 ENCOUNTER — Ambulatory Visit (HOSPITAL_COMMUNITY)
Admission: RE | Admit: 2015-09-08 | Discharge: 2015-09-08 | Disposition: A | Discharge status: Home / Self Care | Payer: BLUE CROSS/BLUE SHIELD | Source: Ambulatory Visit | Attending: Vascular Surgery | Admitting: Vascular Surgery

## 2015-09-08 VITALS — BP 123/53 | HR 93 | Temp 97.3°F | Resp 14

## 2015-09-08 DIAGNOSIS — Z9889 Other specified postprocedural states: Secondary | ICD-10-CM | POA: Diagnosis not present

## 2015-09-08 DIAGNOSIS — L97219 Non-pressure chronic ulcer of right calf with unspecified severity: Principal | ICD-10-CM

## 2015-09-08 DIAGNOSIS — I83012 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: Secondary | ICD-10-CM | POA: Insufficient documentation

## 2015-09-08 NOTE — Progress Notes (Signed)
Today for continued follow-up of her venous hypertension. She underwent right great saphenous vein ablation 1 week ago. She does have a venous ulcer. She had minimal discomfort following the procedure and no bruising.  Past Medical History  Diagnosis Date  . History of chicken pox   . Hypertension   . Sclerodermia generalized   . Non Hodgkin's lymphoma     Treated with chemo 2010, felt to be cured.  . Melanoma 1999  . Peripheral arterial occlusive disease   . Neuropathic foot ulcer   . DOE (dyspnea on exertion)   . PAH (pulmonary artery hypertension)   . Epistaxis   . Pulmonary hypertension     Social History  Substance Use Topics  . Smoking status: Never Smoker   . Smokeless tobacco: Never Used  . Alcohol Use: 0.0 oz/week    0 Standard drinks or equivalent per week     Comment: Occasional    Family History  Problem Relation Age of Onset  . Lupus Father   . Lymphoma Father   . Hyperlipidemia Mother   . Cancer Daughter     sarcoma  . Colon cancer      Allergies  Allergen Reactions  . Sulfa Antibiotics     Severe rash with significant peeling of face. No airway involvement per patient  . Levaquin [Levofloxacin In D5w] Other (See Comments)    Other reaction(s): Other (See Comments) FLU LIKE SYMPTOM  . Penicillins Rash     Current outpatient prescriptions:  .  acidophilus (RISAQUAD) CAPS, Take 1 capsule by mouth every morning. , Disp: , Rfl:  .  aspirin 81 MG tablet, Take 81 mg by mouth every evening. , Disp: , Rfl:  .  atorvastatin (LIPITOR) 10 MG tablet, Take 1 tablet (10 mg total) by mouth daily., Disp: 30 tablet, Rfl: 3 .  cephALEXin (KEFLEX) 500 MG capsule, Take 500 mg by mouth 3 (three) times daily., Disp: , Rfl:  .  cholecalciferol (VITAMIN D) 1000 UNITS tablet, Take 2,000 Units by mouth every evening. , Disp: , Rfl:  .  citalopram (CELEXA) 20 MG tablet, take 1 tablet by mouth once daily, Disp: 30 tablet, Rfl: 3 .  clopidogrel (PLAVIX) 75 MG tablet, take 1  tablet by mouth once daily, Disp: 30 tablet, Rfl: 5 .  fish oil-omega-3 fatty acids 1000 MG capsule, Take 1 g by mouth daily with lunch. , Disp: , Rfl:  .  fluocinonide cream (LIDEX) 0.05 %, apply to affected area twice a day UPON ITCHING OR IRRITATION, Disp: , Rfl: 0 .  gabapentin (NEURONTIN) 300 MG capsule, take 1 capsule by mouth every morning and 2 every evening, Disp: 90 capsule, Rfl: 11 .  Macitentan (OPSUMIT) 10 MG TABS, Take 10 mg by mouth daily., Disp: 30 tablet, Rfl: 6 .  mometasone (NASONEX) 50 MCG/ACT nasal spray, Place 2 sprays into the nose daily., Disp: 17 g, Rfl: 12 .  moxifloxacin (VIGAMOX) 0.5 % ophthalmic solution, Place 1 drop into both eyes 3 (three) times daily as needed. , Disp: , Rfl:  .  multivitamin (THERAGRAN) per tablet, Take 2 tablets by mouth 2 (two) times daily. , Disp: , Rfl:  .  pantoprazole (PROTONIX) 40 MG tablet, take 1 tablet by mouth once daily, Disp: 30 tablet, Rfl: 6 .  predniSONE (DELTASONE) 5 MG tablet, take 1 tablet by mouth once daily WITH BREAKFAST, Disp: 30 tablet, Rfl: 5 .  PredniSONE 2 MG TBEC, Take 2 mg by mouth daily., Disp: 30 tablet, Rfl: 1 .  sildenafil (REVATIO) 20 MG tablet, Take 2 tablets (40 mg total) by mouth 3 (three) times daily., Disp: 270 tablet, Rfl: 6 .  spironolactone (ALDACTONE) 25 MG tablet, Take 1 tablet (25 mg total) by mouth daily., Disp: 30 tablet, Rfl: 3 .  torsemide (DEMADEX) 20 MG tablet, Take 2 tablets (40 mg total) by mouth 2 (two) times daily., Disp: 120 tablet, Rfl: 3 .  vitamin B-12 (CYANOCOBALAMIN) 500 MCG tablet, Take 500 mcg by mouth every morning. , Disp: , Rfl:   Filed Vitals:   09/08/15 1000  BP: 123/53  Pulse: 93  Temp: 97.3 F (36.3 C)  Resp: 14    There is no weight on file to calculate BMI.       On physical exam she does have no evidence of irritation at the insertion site. She has no bruising. She does have some thickening at the ablation.  Venous duplex today reveals complete closure from the  insertion site below knee to 5 mm from the saphenofemoral junction. No evidence of DVT  Impression and plan: Good result following ablation of her great saphenous vein. Will continue local wound care for venous stasis ulcer. Will see [place] again on as-needed basis

## 2015-09-09 DIAGNOSIS — I83222 Varicose veins of left lower extremity with both ulcer of calf and inflammation: Secondary | ICD-10-CM | POA: Diagnosis not present

## 2015-09-09 DIAGNOSIS — L97821 Non-pressure chronic ulcer of other part of left lower leg limited to breakdown of skin: Secondary | ICD-10-CM | POA: Diagnosis not present

## 2015-09-14 ENCOUNTER — Other Ambulatory Visit (HOSPITAL_COMMUNITY): Payer: Self-pay | Admitting: *Deleted

## 2015-09-14 MED ORDER — TORSEMIDE 20 MG PO TABS: 40.0000 mg | ORAL_TABLET | Freq: Two times a day (BID) | ORAL | Status: DC

## 2015-09-15 ENCOUNTER — Telehealth: Payer: Self-pay | Admitting: *Deleted

## 2015-09-15 DIAGNOSIS — I83222 Varicose veins of left lower extremity with both ulcer of calf and inflammation: Secondary | ICD-10-CM | POA: Diagnosis not present

## 2015-09-15 DIAGNOSIS — L97821 Non-pressure chronic ulcer of other part of left lower leg limited to breakdown of skin: Secondary | ICD-10-CM | POA: Diagnosis not present

## 2015-09-15 NOTE — Telephone Encounter (Signed)
Left msg on triage requesting to speak with [place] concerning lab results...[name] [name]

## 2015-09-20 ENCOUNTER — Telehealth: Payer: Self-pay | Admitting: Internal Medicine

## 2015-09-20 NOTE — Telephone Encounter (Signed)
Pt has questions about her recent lab work Can you please call her at [phone]

## 2015-09-20 NOTE — Telephone Encounter (Signed)
I spoke with patient about her labs.

## 2015-09-21 ENCOUNTER — Other Ambulatory Visit (HOSPITAL_COMMUNITY): Payer: Self-pay | Admitting: Internal Medicine

## 2015-09-21 DIAGNOSIS — C8590 Non-Hodgkin lymphoma, unspecified, unspecified site: Secondary | ICD-10-CM

## 2015-09-22 DIAGNOSIS — I83222 Varicose veins of left lower extremity with both ulcer of calf and inflammation: Secondary | ICD-10-CM | POA: Diagnosis not present

## 2015-09-22 DIAGNOSIS — L97821 Non-pressure chronic ulcer of other part of left lower leg limited to breakdown of skin: Secondary | ICD-10-CM | POA: Diagnosis not present

## 2015-09-29 DIAGNOSIS — I83222 Varicose veins of left lower extremity with both ulcer of calf and inflammation: Secondary | ICD-10-CM | POA: Diagnosis not present

## 2015-09-29 DIAGNOSIS — L97821 Non-pressure chronic ulcer of other part of left lower leg limited to breakdown of skin: Secondary | ICD-10-CM | POA: Diagnosis not present

## 2015-10-05 ENCOUNTER — Inpatient Hospital Stay (HOSPITAL_COMMUNITY)
Admission: EM | Admit: 2015-10-05 | Discharge: 2015-10-08 | DRG: 812 | Disposition: A | Discharge status: Home / Self Care | Payer: BLUE CROSS/BLUE SHIELD | Attending: Internal Medicine | Admitting: Internal Medicine

## 2015-10-05 ENCOUNTER — Inpatient Hospital Stay (HOSPITAL_COMMUNITY): Payer: BLUE CROSS/BLUE SHIELD

## 2015-10-05 ENCOUNTER — Encounter (HOSPITAL_COMMUNITY): Payer: Self-pay | Admitting: Internal Medicine

## 2015-10-05 DIAGNOSIS — Z7982 Long term (current) use of aspirin: Secondary | ICD-10-CM

## 2015-10-05 DIAGNOSIS — K621 Rectal polyp: Secondary | ICD-10-CM | POA: Diagnosis present

## 2015-10-05 DIAGNOSIS — I10 Essential (primary) hypertension: Secondary | ICD-10-CM | POA: Diagnosis present

## 2015-10-05 DIAGNOSIS — K208 Unspecified mycosis: Secondary | ICD-10-CM | POA: Diagnosis present

## 2015-10-05 DIAGNOSIS — M349 Systemic sclerosis, unspecified: Secondary | ICD-10-CM | POA: Diagnosis present

## 2015-10-05 DIAGNOSIS — I779 Disorder of arteries and arterioles, unspecified: Secondary | ICD-10-CM | POA: Diagnosis present

## 2015-10-05 DIAGNOSIS — Z7902 Long term (current) use of antithrombotics/antiplatelets: Secondary | ICD-10-CM | POA: Diagnosis not present

## 2015-10-05 DIAGNOSIS — Z9221 Personal history of antineoplastic chemotherapy: Secondary | ICD-10-CM | POA: Diagnosis not present

## 2015-10-05 DIAGNOSIS — Z85820 Personal history of malignant melanoma of skin: Secondary | ICD-10-CM | POA: Diagnosis not present

## 2015-10-05 DIAGNOSIS — I272 Other secondary pulmonary hypertension: Secondary | ICD-10-CM | POA: Diagnosis present

## 2015-10-05 DIAGNOSIS — R0602 Shortness of breath: Secondary | ICD-10-CM

## 2015-10-05 DIAGNOSIS — Z8572 Personal history of non-Hodgkin lymphomas: Secondary | ICD-10-CM | POA: Diagnosis not present

## 2015-10-05 DIAGNOSIS — D509 Iron deficiency anemia, unspecified: Secondary | ICD-10-CM | POA: Diagnosis present

## 2015-10-05 DIAGNOSIS — B49 Unspecified mycosis: Secondary | ICD-10-CM | POA: Diagnosis present

## 2015-10-05 DIAGNOSIS — L940 Localized scleroderma [morphea]: Secondary | ICD-10-CM | POA: Diagnosis not present

## 2015-10-05 DIAGNOSIS — I2729 Other secondary pulmonary hypertension: Secondary | ICD-10-CM | POA: Diagnosis present

## 2015-10-05 DIAGNOSIS — D649 Anemia, unspecified: Secondary | ICD-10-CM | POA: Diagnosis present

## 2015-10-05 DIAGNOSIS — Z79899 Other long term (current) drug therapy: Secondary | ICD-10-CM | POA: Diagnosis not present

## 2015-10-05 DIAGNOSIS — E876 Hypokalemia: Secondary | ICD-10-CM | POA: Diagnosis present

## 2015-10-05 DIAGNOSIS — Z792 Long term (current) use of antibiotics: Secondary | ICD-10-CM

## 2015-10-05 DIAGNOSIS — Z7952 Long term (current) use of systemic steroids: Secondary | ICD-10-CM

## 2015-10-05 DIAGNOSIS — Z9981 Dependence on supplemental oxygen: Secondary | ICD-10-CM

## 2015-10-05 DIAGNOSIS — I739 Peripheral vascular disease, unspecified: Secondary | ICD-10-CM | POA: Diagnosis present

## 2015-10-05 DIAGNOSIS — R5383 Other fatigue: Secondary | ICD-10-CM | POA: Diagnosis present

## 2015-10-05 LAB — BASIC METABOLIC PANEL
Anion gap: 14 (ref 5–15)
BUN: 31 mg/dL — ABNORMAL HIGH (ref 6–20)
CO2: 25 mmol/L (ref 22–32)
Calcium: 9.1 mg/dL (ref 8.9–10.3)
Chloride: 95 mmol/L — ABNORMAL LOW (ref 101–111)
Creatinine, Ser: 1.33 mg/dL — ABNORMAL HIGH (ref 0.44–1.00)
GFR calc Af Amer: 47 mL/min — ABNORMAL LOW (ref >60)
GFR calc non Af Amer: 41 mL/min — ABNORMAL LOW (ref >60)
Glucose, Bld: 109 mg/dL — ABNORMAL HIGH (ref 65–99)
Potassium: 3.8 mmol/L (ref 3.5–5.1)
Sodium: 134 mmol/L — ABNORMAL LOW (ref 135–145)

## 2015-10-05 LAB — VITAMIN B12: Vitamin B-12: 1061 pg/mL — ABNORMAL HIGH (ref 180–914)

## 2015-10-05 LAB — CBC
HCT: 21.1 % — ABNORMAL LOW (ref 36.0–46.0)
Hemoglobin: 6 g/dL — CL (ref 12.0–15.0)
MCH: 22.7 pg — ABNORMAL LOW (ref 26.0–34.0)
MCHC: 28.4 g/dL — ABNORMAL LOW (ref 30.0–36.0)
MCV: 79.9 fL (ref 78.0–100.0)
Platelets: 353 10*3/uL (ref 150–400)
RBC: 2.64 MIL/uL — ABNORMAL LOW (ref 3.87–5.11)
RDW: 20.4 % — ABNORMAL HIGH (ref 11.5–15.5)
WBC: 6.5 10*3/uL (ref 4.0–10.5)

## 2015-10-05 LAB — MAGNESIUM: Magnesium: 2.3 mg/dL (ref 1.7–2.4)

## 2015-10-05 LAB — FOLATE: Folate: 53.1 ng/mL (ref >5.9)

## 2015-10-05 LAB — IRON AND TIBC
Iron: 11 ug/dL — ABNORMAL LOW (ref 28–170)
Saturation Ratios: 2 % — ABNORMAL LOW (ref 10.4–31.8)
TIBC: 522 ug/dL — ABNORMAL HIGH (ref 250–450)
UIBC: 511 ug/dL

## 2015-10-05 LAB — RETICULOCYTES
RBC.: 2.72 MIL/uL — ABNORMAL LOW (ref 3.87–5.11)
Retic Count, Absolute: 81.6 10*3/uL (ref 19.0–186.0)
Retic Ct Pct: 3 % (ref 0.4–3.1)

## 2015-10-05 LAB — ABO/RH: ABO/RH(D): B POS

## 2015-10-05 LAB — MRSA PCR SCREENING: MRSA by PCR: POSITIVE — AB

## 2015-10-05 LAB — PREPARE RBC (CROSSMATCH)

## 2015-10-05 LAB — FERRITIN: Ferritin: 8 ng/mL — ABNORMAL LOW (ref 11–307)

## 2015-10-05 MED ORDER — FLUTICASONE PROPIONATE 50 MCG/ACT NA SUSP
2.0000 | Freq: Every day | NASAL | Status: DC
Start: 2015-10-06 — End: 2015-10-08
  Filled 2015-10-05: qty 16

## 2015-10-05 MED ORDER — PANTOPRAZOLE SODIUM 40 MG IV SOLR
80.0000 mg | Freq: Once | INTRAVENOUS | Status: AC
  Administered 2015-10-05: 80 mg via INTRAVENOUS
  Filled 2015-10-05: qty 80

## 2015-10-05 MED ORDER — ALBUTEROL SULFATE (2.5 MG/3ML) 0.083% IN NEBU: 2.5000 mg | INHALATION_SOLUTION | RESPIRATORY_TRACT | Status: DC | PRN

## 2015-10-05 MED ORDER — OMEGA-3 FATTY ACIDS 1000 MG PO CAPS: 1.0000 g | ORAL_CAPSULE | Freq: Every day | ORAL | Status: DC

## 2015-10-05 MED ORDER — ACETAMINOPHEN 325 MG PO TABS
650.0000 mg | ORAL_TABLET | Freq: Once | ORAL | Status: AC
  Administered 2015-10-05: 650 mg via ORAL
  Filled 2015-10-05: qty 2

## 2015-10-05 MED ORDER — OMEGA-3-ACID ETHYL ESTERS 1 G PO CAPS
1.0000 g | ORAL_CAPSULE | Freq: Every day | ORAL | Status: DC
  Administered 2015-10-06 – 2015-10-08 (×3): 1 g via ORAL
  Filled 2015-10-05 (×3): qty 1

## 2015-10-05 MED ORDER — PANTOPRAZOLE SODIUM 40 MG IV SOLR
40.0000 mg | Freq: Two times a day (BID) | INTRAVENOUS | Status: DC
  Administered 2015-10-05 – 2015-10-08 (×6): 40 mg via INTRAVENOUS
  Filled 2015-10-05 (×6): qty 40

## 2015-10-05 MED ORDER — ALUM & MAG HYDROXIDE-SIMETH 200-200-20 MG/5ML PO SUSP: 30.0000 mL | Freq: Four times a day (QID) | ORAL | Status: DC | PRN

## 2015-10-05 MED ORDER — SODIUM CHLORIDE 0.9 % IV SOLN
10.0000 mL/h | Freq: Once | INTRAVENOUS | Status: AC
  Administered 2015-10-05: 10 mL/h via INTRAVENOUS

## 2015-10-05 MED ORDER — SODIUM CHLORIDE 0.9 % IV SOLN
INTRAVENOUS | Status: AC
  Administered 2015-10-05 – 2015-10-06 (×2): via INTRAVENOUS

## 2015-10-05 MED ORDER — CYANOCOBALAMIN 500 MCG PO TABS
500.0000 ug | ORAL_TABLET | Freq: Every morning | ORAL | Status: DC
  Administered 2015-10-06 – 2015-10-08 (×3): 500 ug via ORAL
  Filled 2015-10-05 (×3): qty 1

## 2015-10-05 MED ORDER — ACETAMINOPHEN 650 MG RE SUPP: 650.0000 mg | Freq: Four times a day (QID) | RECTAL | Status: DC | PRN

## 2015-10-05 MED ORDER — PREDNISONE 5 MG PO TABS
5.0000 mg | ORAL_TABLET | Freq: Every day | ORAL | Status: DC
  Administered 2015-10-06 – 2015-10-08 (×3): 5 mg via ORAL
  Filled 2015-10-05 (×3): qty 1

## 2015-10-05 MED ORDER — TORSEMIDE 20 MG PO TABS
40.0000 mg | ORAL_TABLET | Freq: Two times a day (BID) | ORAL | Status: DC
  Administered 2015-10-06 – 2015-10-08 (×5): 40 mg via ORAL
  Filled 2015-10-05 (×6): qty 2

## 2015-10-05 MED ORDER — MACITENTAN 10 MG PO TABS
10.0000 mg | ORAL_TABLET | Freq: Every day | ORAL | Status: DC
  Administered 2015-10-06 – 2015-10-07 (×2): 10 mg via ORAL
  Filled 2015-10-05 (×3): qty 1

## 2015-10-05 MED ORDER — SENNOSIDES-DOCUSATE SODIUM 8.6-50 MG PO TABS: 1.0000 | ORAL_TABLET | Freq: Every evening | ORAL | Status: DC | PRN

## 2015-10-05 MED ORDER — DIPHENHYDRAMINE HCL 25 MG PO CAPS
25.0000 mg | ORAL_CAPSULE | Freq: Once | ORAL | Status: AC
  Administered 2015-10-05: 25 mg via ORAL
  Filled 2015-10-05: qty 1

## 2015-10-05 MED ORDER — ONDANSETRON HCL 4 MG PO TABS: 4.0000 mg | ORAL_TABLET | Freq: Four times a day (QID) | ORAL | Status: DC | PRN

## 2015-10-05 MED ORDER — SILDENAFIL CITRATE 20 MG PO TABS
40.0000 mg | ORAL_TABLET | Freq: Three times a day (TID) | ORAL | Status: DC
  Administered 2015-10-05 – 2015-10-08 (×7): 40 mg via ORAL
  Filled 2015-10-05 (×7): qty 2

## 2015-10-05 MED ORDER — ACETAMINOPHEN 325 MG PO TABS
650.0000 mg | ORAL_TABLET | Freq: Four times a day (QID) | ORAL | Status: DC | PRN
  Filled 2015-10-05: qty 2

## 2015-10-05 MED ORDER — FUROSEMIDE 10 MG/ML IJ SOLN
20.0000 mg | Freq: Once | INTRAMUSCULAR | Status: AC
  Administered 2015-10-06: 20 mg via INTRAVENOUS
  Filled 2015-10-05: qty 2

## 2015-10-05 MED ORDER — CITALOPRAM HYDROBROMIDE 20 MG PO TABS
20.0000 mg | ORAL_TABLET | Freq: Every day | ORAL | Status: DC
  Administered 2015-10-05 – 2015-10-07 (×3): 20 mg via ORAL
  Filled 2015-10-05 (×3): qty 1

## 2015-10-05 MED ORDER — SPIRONOLACTONE 25 MG PO TABS
25.0000 mg | ORAL_TABLET | Freq: Every day | ORAL | Status: DC
  Administered 2015-10-06 – 2015-10-08 (×3): 25 mg via ORAL
  Filled 2015-10-05 (×3): qty 1

## 2015-10-05 MED ORDER — ONDANSETRON HCL 4 MG/2ML IJ SOLN: 4.0000 mg | Freq: Four times a day (QID) | INTRAMUSCULAR | Status: DC | PRN

## 2015-10-05 MED ORDER — CETYLPYRIDINIUM CHLORIDE 0.05 % MT LIQD
7.0000 mL | Freq: Two times a day (BID) | OROMUCOSAL | Status: DC
  Administered 2015-10-05 – 2015-10-07 (×2): 7 mL via OROMUCOSAL

## 2015-10-05 MED ORDER — SODIUM CHLORIDE 0.9 % IJ SOLN
3.0000 mL | Freq: Two times a day (BID) | INTRAMUSCULAR | Status: DC
  Administered 2015-10-05: 22:00:00 via INTRAVENOUS
  Administered 2015-10-06 – 2015-10-08 (×4): 3 mL via INTRAVENOUS

## 2015-10-05 MED ORDER — ADULT MULTIVITAMIN W/MINERALS CH
2.0000 | ORAL_TABLET | Freq: Two times a day (BID) | ORAL | Status: DC
  Administered 2015-10-05: 2 via ORAL
  Filled 2015-10-05 (×3): qty 2

## 2015-10-05 MED ORDER — VITAMIN D 1000 UNITS PO TABS
2000.0000 [IU] | ORAL_TABLET | Freq: Every evening | ORAL | Status: DC
  Administered 2015-10-05 – 2015-10-07 (×3): 2000 [IU] via ORAL
  Filled 2015-10-05 (×3): qty 2

## 2015-10-05 MED ORDER — RISAQUAD PO CAPS
1.0000 | ORAL_CAPSULE | Freq: Every morning | ORAL | Status: DC
  Administered 2015-10-06 – 2015-10-08 (×3): 1 via ORAL
  Filled 2015-10-05 (×4): qty 1

## 2015-10-05 MED ORDER — ATORVASTATIN CALCIUM 10 MG PO TABS
10.0000 mg | ORAL_TABLET | Freq: Every day | ORAL | Status: DC
  Administered 2015-10-06 – 2015-10-08 (×3): 10 mg via ORAL
  Filled 2015-10-05 (×3): qty 1

## 2015-10-05 MED ORDER — SORBITOL 70 % SOLN: 30.0000 mL | Freq: Every day | Status: DC | PRN

## 2015-10-05 NOTE — ED Notes (Signed)
Patient went to physician yesterday, patients hemoglobin was 6.2 and physician instructed her to go to ED

## 2015-10-05 NOTE — H&P (Signed)
[facility] History and Physical  [name] [name] BDZ:[id number] DOB: 1951/01/10 DOA: 10/05/2015  Referring physician: Dr Wilson [name] PCP: [name] [name]  Cardiologist: [name] [name] Rheumatologist: [name] [name]  Chief Complaint: Abnormal labs/fatigue/anemia  HPI: [name] [name] is a 64 y.o. female  With history of scleroderma on chronic prednisone, history of pulmonary hypertension on aspirin and Plavix, history of non-Hodgkin's lymphoma status post treatment with chemotherapy in 2010 felt to be cured, hypertension, peripheral arterial occlusive disease who presents to the ED with anemia. Patient stated that she had blood work done about 3 weeks ago at the PCPs office and a hemoglobin was 8.2. Patient had repeat blood work done one day prior to admission at her rheumatologist's office was noted to have a hemoglobin of 6.2. Patient was subsequently directly to the ED. Patient does report approximately a 10 day history of increased lethargy, generalized fatigue, generalized weakness, worsening shortness of breath with increased O2 requirements increase in her oxygen from 2 L to 4 L/m. Patient denies any chest pain, no fevers, no chills, no nausea, no vomiting, no hematemesis, no abdominal pain, no diarrhea, no constipation, no hematochezia. Patient states took some iron supplements for about a week and noted she had black stools then which resolved after she stopped taking iron supplements. Patient denies any NSAID use. Patient denies any prior history of peptic ulcer disease. Patient states she drinks occasionally. Patient reported a EGD done approximately 10 years ago in [place]. Patient was seen in the emergency room CBC drawn on a hemoglobin of 6.0 otherwise was within normal limits. Basic metabolic profile sodium of 134 chloride of 95 BUN of 31 creatinine of 1.33 otherwise was within normal limits. Patient was typed and screened for 2 units of packed red blood cells. ED physician stated he  spoke with [name] [name] of gastroenterology who will see the patient in consultation. [facility] will call to admit the patient for further evaluation and management.   Review of Systems: As per history of present illness otherwise negative. Constitutional:  No weight loss, night sweats, Fevers, chills, fatigue.  HEENT:  No headaches, Difficulty swallowing,Tooth/dental problems,Sore throat,  No sneezing, itching, ear ache, nasal congestion, post nasal drip,  Cardio-vascular:  No chest pain, Orthopnea, PND, swelling in lower extremities, anasarca, dizziness, palpitations  GI:  No heartburn, indigestion, abdominal pain, nausea, vomiting, diarrhea, change in bowel habits, loss of appetite  Resp:  No shortness of breath with exertion or at rest. No excess mucus, no productive cough, No non-productive cough, No coughing up of blood.No change in color of mucus.No wheezing.No chest wall deformity  Skin:  no rash or lesions.  GU:  no dysuria, change in color of urine, no urgency or frequency. No flank pain.  Musculoskeletal:  No joint pain or swelling. No decreased range of motion. No back pain.  Psych:  No change in mood or affect. No depression or anxiety. No memory loss.   Past Medical History  Diagnosis Date  . History of chicken pox   . Hypertension   . Sclerodermia generalized ([place])   . Non Hodgkin's lymphoma ([place])     Treated with chemo 2010, felt to be cured.  . Melanoma ([place]) 1999  . Peripheral arterial occlusive disease ([place])   . 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[place])   . DOE (dyspnea on exertion)   . PAH (pulmonary artery hypertension) ([place])   . Epistaxis   . Pulmonary hypertension [facility]    Past Surgical History  Procedure Laterality Date  .  Rt little toe removal  10/1998  . Stent left thigh  3/11  . Tonsillectomy and adenoidectomy    . Small intestine surgery    . Biopsy on cerebellum    . Vein ligation and stripping  05/16/2012    Procedure: VEIN LIGATION AND  STRIPPING;  Surgeon: [name], [name];  Location: [facility] OR;  Service: Vascular;  Laterality: Right;  Irrigation and Debridement right lower leg, ligation of saphenous vein.  . Endovenous ablation saphenous vein w/ laser  07-24-2012    right greater saphenous vein by [name], M.D.   . Endovenous ablation saphenous vein w/ laser  10-16-2012    left greater saphenous vein  by [name]. [name]  . Apligraph  12-04-2102,   12-18-2012    bilateral calves   [name]  . Tunneled venous catheter placement  01/2013  . Right heart catheterization N/A 11/14/2012    Procedure: RIGHT HEART CATH;  Surgeon: [name], [name];  Location: [facility] CATH LAB;  Service: Cardiovascular;  Laterality: N/A;  . Endovenous ablation saphenous vein w/ laser Right 09-01-2015    endovenous laser ablation right greater saphenous vein by [name] [name]   Social History:  reports that she has never smoked. She has never used smokeless tobacco. She reports that she drinks alcohol. She reports that she does not use illicit drugs.  Allergies  Allergen Reactions  . Sulfa Antibiotics     Severe rash with significant peeling of face. No airway involvement per patient  . Levaquin [Levofloxacin In D5w] Other (See Comments)    Other reaction(s): Other (See Comments) FLU LIKE SYMPTOM  . Penicillins Rash    Family History  Problem Relation Age of Onset  . Lupus Father   . Lymphoma Father   . Hyperlipidemia Mother   . Cancer Daughter     sarcoma  . Colon cancer     mother deceased at age 66 from arterial sclerosis [place]. Father deceased at age 50 had lupus and lymphoma. Had a daughter deceased at age 13 from sarcoma.  Prior to Admission medications   Medication Sig Start Date End Date Taking? Authorizing Provider  acidophilus (RISAQUAD) CAPS Take 1 capsule by mouth every morning.     Historical Provider, [name]  aspirin 81 MG tablet Take 81 mg by mouth every evening.     Historical Provider, [name]  atorvastatin  (LIPITOR) 10 MG tablet Take 1 tablet (10 mg total) by mouth daily. 06/20/15   Shaune Pascal Bensimhon, [name]  cephALEXin (KEFLEX) 500 MG capsule Take 500 mg by mouth 3 (three) times daily.    Historical Provider, [name]  cholecalciferol (VITAMIN D) 1000 UNITS tablet Take 2,000 Units by mouth every evening.     Historical Provider, [name]  citalopram (CELEXA) 20 MG tablet take 1 tablet by mouth once daily 06/29/15   [name] [name]  clopidogrel (PLAVIX) 75 MG tablet take 1 tablet by mouth once daily 05/10/15   [name] [name]  fish oil-omega-3 fatty acids 1000 MG capsule Take 1 g by mouth daily with lunch.     Historical Provider, [name]  fluocinonide cream (LIDEX) 0.05 % apply to affected area twice a day UPON ITCHING OR IRRITATION 06/28/15   Historical Provider, [name]  gabapentin (NEURONTIN) 300 MG capsule take 1 capsule by mouth every morning and 2 every evening 04/21/15   [name] [name]  Macitentan (OPSUMIT) 10 MG TABS Take 10 mg by mouth daily. 02/22/15   [name]  Aundra Dubin, [name]  mometasone (NASONEX) 50 MCG/ACT nasal spray Place 2 sprays into the nose daily. 06/15/15   [name] [name]  moxifloxacin (VIGAMOX) 0.5 % ophthalmic solution Place 1 drop into both eyes 3 (three) times daily as needed.     Historical Provider, [name]  multivitamin [facility]) per tablet Take 2 tablets by mouth 2 (two) times daily.     Historical Provider, [name]  pantoprazole (PROTONIX) 40 MG tablet take 1 tablet by mouth once daily 04/26/15   [name] [name]  predniSONE (DELTASONE) 5 MG tablet take 1 tablet by mouth once daily WITH BREAKFAST 08/10/15   Collene Gobble, [name]  PredniSONE 2 MG TBEC Take 2 mg by mouth daily. 04/26/15   Collene Gobble, [name]  sildenafil (REVATIO) 20 MG tablet Take 2 tablets (40 mg total) by mouth 3 (three) times daily. 02/08/15   [name], [name]  spironolactone (ALDACTONE) 25 MG tablet Take 1 tablet (25 mg total) by mouth daily. 07/13/15   [name] [name]  torsemide (DEMADEX) 20 MG  tablet Take 2 tablets (40 mg total) by mouth 2 (two) times daily. 09/14/15   [name], [name]  vitamin B-12 (CYANOCOBALAMIN) 500 MCG tablet Take 500 mcg by mouth every morning.     Historical Provider, [name]   Physical Exam: Filed Vitals:   10/05/15 1500 10/05/15 1515 10/05/15 1530 10/05/15 1545  BP: 129/48 125/46 131/56 108/48  Pulse: 64 66 66 66  Temp:      TempSrc:      Resp: _0 Height:      Weight:      SpO2: 100% 98% 97% 98%    Wt Readings from Last 3 Encounters:  10/05/15 61.236 kg (135 lb)  09/06/15 62.651 kg (138 lb 1.9 oz)  09/01/15 63.504 kg (140 lb)    General:  Well-developed well nourished female lying on gurney in no acute cardiopulmonary distress. Speaking in full sentences.  Eyes: PERRLA, EOMI, normal lids, irises & conjunctiva ENT: grossly normal hearing, lips & tongue Neck: no LAD, masses or thyromegaly Cardiovascular: RRR, no m/r/g. No LE edema. Telemetry: SR, no arrhythmias  Respiratory: CTA bilaterally, no w/r/r. Normal respiratory effort. Abdomen: soft, ntnd, positive bowel sounds, no rebound, no guarding Skin: no rash or induration seen on limited exam. Skin tightening. Chronic changes of scleroderma noted on hands bilaterally. Musculoskeletal: grossly normal tone BUE/BLE Psychiatric: grossly normal mood and affect, speech fluent and appropriate Neurologic: Alert and oriented 3. Cranial nerves II through XII grossly intact. No focal deficits.           Labs on Admission:  Basic Metabolic Panel:  Recent Labs Lab 10/05/15 1239  NA 134*  K 3.8  CL 95*  CO2 25  GLUCOSE 109*  BUN 31*  CREATININE 1.33*  CALCIUM 9.1   Liver Function Tests: No results for input(s): AST, ALT, ALKPHOS, BILITOT, PROT, ALBUMIN in the last 168 hours. No results for input(s): LIPASE, AMYLASE in the last 168 hours. No results for input(s): AMMONIA in the last 168 hours. CBC:  Recent Labs Lab 10/05/15 1239  WBC 6.5  HGB 6.0*  HCT 21.1*  MCV 79.9    PLT 353   Cardiac Enzymes: No results for input(s): CKTOTAL, CKMB, CKMBINDEX, TROPONINI in the last 168 hours.  BNP (last 3 results)  Recent Labs  02/22/15 1230  BNP 303.8*    ProBNP (last 3 results)  Recent Labs  09/06/15 1221  PROBNP 305.0*  CBG: No results for input(s): GLUCAP in the last 168 hours.  Radiological Exams on Admission: No results found.  EKG: None  Assessment/Plan Principal Problem:   Symptomatic anemia Active Problems:   Peripheral arterial occlusive disease (HCC)   Scleroderma, circumscribed or localized   Essential hypertension   Pulmonary hypertension associated with systemic disorder (HCC)   Anemia  #1 symptomatic anemia Patient presenting with lethargy, fatigue, worsening shortness of breath noted to find a hemoglobin of 6. Patient denies any chest pain. Patient with history of scleroderma on chronic prednisone therapy. No use of NSAIDs. Questionable etiology. Patient denies any overt bleeding. FOBT is pending. Anemia panel has been drawn and is pending. Patient has been typed and screened and patient to a chest use 2 units packed red blood cells. Place on PPI IV every 12 hours. Gentle IV hydration. Supportive care. Will hold patient's aspirin and Plavix for now. GI has been consulted and patient be seen in consultation by [name] [name] of [facility] gastroenterology. Will place patient on clear liquids for now. Follow.  #2 hypertension Stable.  #3 pulmonary hypertension Continue Aldactone, Demadex, Slidenafil, macitentan. Will hold patient's aspirin and Plavix secondary to problem #1. GI to advise when aspirin and Plavix may be resumed.  #4 scleroderma Continue chronic prednisone for now. Patient will need to follow-up with rheumatologist as outpatient.  #5 prophylaxis PPI for GI prophylaxis. SCDs for DVT prophylaxis.  Code Status: Full DVT Prophylaxis: SCDs Family Communication: updated patient and husband at bedside. Disposition Plan:  Admit to telemetry.  Time spent: 60 mins  [facility] Pager [phone]

## 2015-10-05 NOTE — ED Provider Notes (Signed)
CSN: [id number]     Arrival date & time 10/05/15  1159 History   First MD Initiated Contact with Patient 10/05/15 1457     Chief Complaint  Patient presents with  . Anemia     (Consider location/radiation/quality/duration/timing/severity/associated sxs/prior Treatment) HPI   64 year old female with anemia. Patient reports that he had recent blood work which showed hemoglobin of 8.2. Had repeat blood work again today which showed it has worsened. On review of systems, she does endorse increasing fatigue over the past several weeks. She increased her home oxygen requirement from 2 L to 4 L over the same time period. Some dizziness with changes in position. She tried iron supplementation for about a week and reports darker stool during this time which stopped when she stopped taking it. No BRBPR. Denies past history of significant anemia. She is on Plavix. Reports EGD approximately 10 years ago in [place] that was done with regards to her history of scleroderma. Has not seen GI since then. Denies past history of peptic ulcer disease. Denies significant NSAID use. Drinks alcohol occasionally.    Past Medical History  Diagnosis Date  . History of chicken pox   . Hypertension   . Sclerodermia generalized   . Non Hodgkin's lymphoma     Treated with chemo 2010, felt to be cured.  . Melanoma 1999  . Peripheral arterial occlusive disease   . Neuropathic foot ulcer   . DOE (dyspnea on exertion)   . PAH (pulmonary artery hypertension)   . Epistaxis   . Pulmonary hypertension    Past Surgical History  Procedure Laterality Date  . Rt little toe removal  10/1998  . Stent left thigh  3/11  . Tonsillectomy and adenoidectomy    . Small intestine surgery    . Biopsy on cerebellum    . Vein ligation and stripping  05/16/2012    Procedure: VEIN LIGATION AND STRIPPING;  Surgeon: [name], MD;  Location: [facility] OR;  Service: Vascular;  Laterality: Right;  Irrigation and Debridement right lower leg,  ligation of saphenous vein.  . Endovenous ablation saphenous vein w/ laser  07-24-2012    right greater saphenous vein by [name], M.D.   . Endovenous ablation saphenous vein w/ laser  10-16-2012    left greater saphenous vein  by Dr. [name]  . Apligraph  12-04-2102,   12-18-2012    bilateral calves   [name]  . Tunneled venous catheter placement  01/2013  . Right heart catheterization N/A 11/14/2012    Procedure: RIGHT HEART CATH;  Surgeon: [name], MD;  Location: [facility] CATH LAB;  Service: Cardiovascular;  Laterality: N/A;  . Endovenous ablation saphenous vein w/ laser Right 09-01-2015    endovenous laser ablation right greater saphenous vein by [name] MD   Family History  Problem Relation Age of Onset  . Lupus Father   . Lymphoma Father   . Hyperlipidemia Mother   . Cancer Daughter     sarcoma  . Colon cancer     Social History  Substance Use Topics  . Smoking status: Never Smoker   . Smokeless tobacco: Never Used  . Alcohol Use: 0.0 oz/week    0 Standard drinks or equivalent per week     Comment: Occasional   OB History    No data available     Review of Systems  All systems reviewed and negative, other than as noted in HPI.   Allergies  Sulfa antibiotics;  Levaquin; and Penicillins  Home Medications   Prior to Admission medications   Medication Sig Start Date End Date Taking? Authorizing Provider  acidophilus (RISAQUAD) CAPS Take 1 capsule by mouth every morning.     Historical Provider, MD  aspirin 81 MG tablet Take 81 mg by mouth every evening.     Historical Provider, MD  atorvastatin (LIPITOR) 10 MG tablet Take 1 tablet (10 mg total) by mouth daily. 06/20/15   Shaune Pascal Bensimhon, MD  cephALEXin (KEFLEX) 500 MG capsule Take 500 mg by mouth 3 (three) times daily.    Historical Provider, MD  cholecalciferol (VITAMIN D) 1000 UNITS tablet Take 2,000 Units by mouth every evening.     Historical Provider, MD  citalopram (CELEXA) 20  MG tablet take 1 tablet by mouth once daily 06/29/15   Hoyt Koch, MD  clopidogrel (PLAVIX) 75 MG tablet take 1 tablet by mouth once daily 05/10/15   Hoyt Koch, MD  fish oil-omega-3 fatty acids 1000 MG capsule Take 1 g by mouth daily with lunch.     Historical Provider, MD  fluocinonide cream (LIDEX) 0.05 % apply to affected area twice a day UPON ITCHING OR IRRITATION 06/28/15   Historical Provider, MD  gabapentin (NEURONTIN) 300 MG capsule take 1 capsule by mouth every morning and 2 every evening 04/21/15   Hoyt Koch, MD  Macitentan (OPSUMIT) 10 MG TABS Take 10 mg by mouth daily. 02/22/15   Larey Dresser, MD  mometasone (NASONEX) 50 MCG/ACT nasal spray Place 2 sprays into the nose daily. 06/15/15   Hoyt Koch, MD  moxifloxacin (VIGAMOX) 0.5 % ophthalmic solution Place 1 drop into both eyes 3 (three) times daily as needed.     Historical Provider, MD  multivitamin [facility]) per tablet Take 2 tablets by mouth 2 (two) times daily.     Historical Provider, MD  pantoprazole (PROTONIX) 40 MG tablet take 1 tablet by mouth once daily 04/26/15   Hoyt Koch, MD  predniSONE (DELTASONE) 5 MG tablet take 1 tablet by mouth once daily WITH BREAKFAST 08/10/15   Collene Gobble, MD  PredniSONE 2 MG TBEC Take 2 mg by mouth daily. 04/26/15   Collene Gobble, MD  sildenafil (REVATIO) 20 MG tablet Take 2 tablets (40 mg total) by mouth 3 (three) times daily. 02/08/15   [name], MD  spironolactone (ALDACTONE) 25 MG tablet Take 1 tablet (25 mg total) by mouth daily. 07/13/15   Hoyt Koch, MD  torsemide (DEMADEX) 20 MG tablet Take 2 tablets (40 mg total) by mouth 2 (two) times daily. 09/14/15   [name], MD  vitamin B-12 (CYANOCOBALAMIN) 500 MCG tablet Take 500 mcg by mouth every morning.     Historical Provider, MD   BP 134/57 mmHg  Pulse 66  Temp(Src) 98.4 F (36.9 C) (Oral)  Resp 15  Ht _0  (1.727 m)  Wt 135 lb (61.236 kg)  BMI 20.53 kg/m2   SpO2 99% Physical Exam  Constitutional: She appears well-developed and well-nourished. No distress.  Laying in bed. NAD.   HENT:  Head: Normocephalic and atraumatic.  Eyes: Conjunctivae are normal. Right eye exhibits no discharge. Left eye exhibits no discharge.  Neck: Neck supple.  Cardiovascular: Normal rate, regular rhythm and normal heart sounds.  Exam reveals no gallop and no friction rub.   No murmur heard. Pulmonary/Chest: Effort normal and breath sounds normal. No respiratory distress.  Abdominal: Soft. She exhibits no distension. There is no  tenderness.  Musculoskeletal: She exhibits no edema or tenderness.  Neurological: She is alert.  Skin: Skin is warm and dry.  Stigmata of scleroderma noted to hands  Psychiatric: She has a normal mood and affect. Her behavior is normal. Thought content normal.  Nursing note and vitals reviewed.   ED Course  Procedures (including critical care time)  CRITICAL CARE Performed by: Virgel Manifold Total critical care time: 35 minutes Critical care time was exclusive of separately billable procedures and treating other patients. Critical care was necessary to treat or prevent imminent or life-threatening deterioration. Critical care was time spent personally by me on the following activities: development of treatment plan with patient and/or surrogate as well as nursing, discussions with consultants, evaluation of patient's response to treatment, examination of patient, obtaining history from patient or surrogate, ordering and performing treatments and interventions, ordering and review of laboratory studies, ordering and review of radiographic studies, pulse oximetry and re-evaluation of patient's condition.  Labs Review Labs Reviewed  CBC - Abnormal; Notable for the following:    RBC 2.64 (*)    Hemoglobin 6.0 (*)    HCT 21.1 (*)    MCH 22.7 (*)    MCHC 28.4 (*)    RDW 20.4 (*)    All other components within normal limits  BASIC  METABOLIC PANEL - Abnormal; Notable for the following:    Sodium 134 (*)    Chloride 95 (*)    Glucose, Bld 109 (*)    BUN 31 (*)    Creatinine, Ser 1.33 (*)    GFR calc non Af Amer 41 (*)    GFR calc Af Amer 47 (*)    All other components within normal limits  OCCULT BLOOD X 1 CARD TO LAB, STOOL  VITAMIN B12  FOLATE  IRON AND TIBC  FERRITIN  RETICULOCYTES  TYPE AND SCREEN  ABO/RH  PREPARE RBC (CROSSMATCH)    Imaging Review No results found. I have personally reviewed and evaluated these images and lab results as part of my medical decision-making.   EKG Interpretation None      MDM   Final diagnoses:  Symptomatic anemia    64yF with symptomatic anemia. Check FOBT. Anemia panel. Transfusion. Admission.   Discussed with Dr Oletta Lamas, GI.      Virgel Manifold, MD 10/05/15 [phone]

## 2015-10-05 NOTE — ED Notes (Signed)
Attempted to call report

## 2015-10-05 NOTE — Progress Notes (Signed)
Patient arrived on unit from ED via stretcher.  Family at bedside.  Telemetry placed per MD order & CMT notified.

## 2015-10-06 ENCOUNTER — Encounter (HOSPITAL_BASED_OUTPATIENT_CLINIC_OR_DEPARTMENT_OTHER): Payer: BLUE CROSS/BLUE SHIELD | Attending: Internal Medicine

## 2015-10-06 DIAGNOSIS — I272 Other secondary pulmonary hypertension: Secondary | ICD-10-CM | POA: Insufficient documentation

## 2015-10-06 DIAGNOSIS — M349 Systemic sclerosis, unspecified: Secondary | ICD-10-CM | POA: Insufficient documentation

## 2015-10-06 DIAGNOSIS — I7300 Raynaud's syndrome without gangrene: Secondary | ICD-10-CM | POA: Insufficient documentation

## 2015-10-06 DIAGNOSIS — I872 Venous insufficiency (chronic) (peripheral): Secondary | ICD-10-CM | POA: Insufficient documentation

## 2015-10-06 DIAGNOSIS — L97811 Non-pressure chronic ulcer of other part of right lower leg limited to breakdown of skin: Secondary | ICD-10-CM | POA: Insufficient documentation

## 2015-10-06 DIAGNOSIS — D509 Iron deficiency anemia, unspecified: Principal | ICD-10-CM

## 2015-10-06 LAB — BASIC METABOLIC PANEL
Anion gap: 10 (ref 5–15)
BUN: 18 mg/dL (ref 6–20)
CO2: 26 mmol/L (ref 22–32)
Calcium: 8.7 mg/dL — ABNORMAL LOW (ref 8.9–10.3)
Chloride: 102 mmol/L (ref 101–111)
Creatinine, Ser: 1.07 mg/dL — ABNORMAL HIGH (ref 0.44–1.00)
GFR calc Af Amer: >60 mL/min (ref >60)
GFR calc non Af Amer: 53 mL/min — ABNORMAL LOW (ref >60)
Glucose, Bld: 89 mg/dL (ref 65–99)
Potassium: 3.3 mmol/L — ABNORMAL LOW (ref 3.5–5.1)
Sodium: 138 mmol/L (ref 135–145)

## 2015-10-06 LAB — CBC
HCT: 26.7 % — ABNORMAL LOW (ref 36.0–46.0)
HCT: 27.1 % — ABNORMAL LOW (ref 36.0–46.0)
Hemoglobin: 7.8 g/dL — ABNORMAL LOW (ref 12.0–15.0)
Hemoglobin: 8.1 g/dL — ABNORMAL LOW (ref 12.0–15.0)
MCH: 23.6 pg — ABNORMAL LOW (ref 26.0–34.0)
MCH: 24.3 pg — ABNORMAL LOW (ref 26.0–34.0)
MCHC: 29.2 g/dL — ABNORMAL LOW (ref 30.0–36.0)
MCHC: 29.9 g/dL — ABNORMAL LOW (ref 30.0–36.0)
MCV: 80.9 fL (ref 78.0–100.0)
MCV: 81.1 fL (ref 78.0–100.0)
Platelets: 327 10*3/uL (ref 150–400)
Platelets: 316 10*3/uL (ref 150–400)
RBC: 3.3 MIL/uL — ABNORMAL LOW (ref 3.87–5.11)
RBC: 3.34 MIL/uL — ABNORMAL LOW (ref 3.87–5.11)
RDW: 18.2 % — ABNORMAL HIGH (ref 11.5–15.5)
RDW: 18.5 % — ABNORMAL HIGH (ref 11.5–15.5)
WBC: 5 10*3/uL (ref 4.0–10.5)
WBC: 6.7 10*3/uL (ref 4.0–10.5)

## 2015-10-06 MED ORDER — IRON DEXTRAN 50 MG/ML IJ SOLN
25.0000 mg | Freq: Once | INTRAMUSCULAR | Status: AC
  Administered 2015-10-06: 25 mg via INTRAVENOUS
  Filled 2015-10-06: qty 0.5

## 2015-10-06 MED ORDER — BISACODYL 5 MG PO TBEC
10.0000 mg | DELAYED_RELEASE_TABLET | Freq: Once | ORAL | Status: AC
  Administered 2015-10-06: 10 mg via ORAL
  Filled 2015-10-06: qty 2

## 2015-10-06 MED ORDER — SODIUM CHLORIDE 0.9 % IV SOLN
1000.0000 mg | Freq: Once | INTRAVENOUS | Status: AC
  Administered 2015-10-06: 1000 mg via INTRAVENOUS
  Filled 2015-10-06 (×2): qty 20

## 2015-10-06 MED ORDER — PEG-KCL-NACL-NASULF-NA ASC-C 100 G PO SOLR: 1.0000 | Freq: Once | ORAL | Status: DC

## 2015-10-06 MED ORDER — PEG-KCL-NACL-NASULF-NA ASC-C 100 G PO SOLR
0.5000 | Freq: Once | ORAL | Status: AC
  Administered 2015-10-07: 100 g via ORAL

## 2015-10-06 MED ORDER — ADULT MULTIVITAMIN W/MINERALS CH
1.0000 | ORAL_TABLET | Freq: Every day | ORAL | Status: DC
  Administered 2015-10-06 – 2015-10-08 (×3): 1 via ORAL
  Filled 2015-10-06 (×3): qty 1

## 2015-10-06 MED ORDER — POTASSIUM CHLORIDE CRYS ER 20 MEQ PO TBCR
40.0000 meq | EXTENDED_RELEASE_TABLET | Freq: Once | ORAL | Status: AC
  Administered 2015-10-06: 40 meq via ORAL
  Filled 2015-10-06: qty 2

## 2015-10-06 MED ORDER — MUPIROCIN 2 % EX OINT
1.0000 "application " | TOPICAL_OINTMENT | Freq: Two times a day (BID) | CUTANEOUS | Status: DC
  Administered 2015-10-06 – 2015-10-08 (×5): 1 via NASAL
  Filled 2015-10-06 (×3): qty 22

## 2015-10-06 MED ORDER — SODIUM CHLORIDE 0.9 % IV SOLN: INTRAVENOUS | Status: DC

## 2015-10-06 MED ORDER — CHLORHEXIDINE GLUCONATE CLOTH 2 % EX PADS
6.0000 | MEDICATED_PAD | Freq: Every day | CUTANEOUS | Status: DC
  Administered 2015-10-06 – 2015-10-08 (×2): 6 via TOPICAL

## 2015-10-06 MED ORDER — PEG-KCL-NACL-NASULF-NA ASC-C 100 G PO SOLR
0.5000 | Freq: Once | ORAL | Status: AC
  Administered 2015-10-06: 100 g via ORAL
  Filled 2015-10-06: qty 1

## 2015-10-06 NOTE — Progress Notes (Signed)
[facility] PROGRESS NOTE  [name] [name] NTI:[id number] DOB: 06-27-1951 DOA: 10/05/2015 PCP: Hoyt Koch, MD  Assessment/Plan: #1 symptomatic anemia/iron deficiency anemia Questionable etiology. Patient with history of scleroderma. Patient with no overt bleeding. FOBT pending. Hemoglobin on admission was 6.0. Patient status post 2 units packed red blood cells with hemoglobin currently at 7.8. Anemia panel consistent with iron deficiency anemia with a iron level of 11 and a ferritin of 8. Patient states when taking oral iron developed diarrhea. Will give IV iron. Patient has been assessed by gastroenterology and patient for upper endoscopy and colonoscopy tomorrow. Continue to hold aspirin and Plavix. Serial CBCs. Continue PPI. GI has ordered celiac serologies. GI following and appreciate input and recommendations.  #2 chronic scleroderma Stable. Outpatient follow-up.  #3 hypertension Stable.  #4 pulmonary hypertension Continue home regimen of Aldactone, Demadex, slidenafil, macitentan. Aspirin and Plavix on hold. GI to advise 1 may resume aspirin and Plavix.  #5 prophylaxis PPI for GI prophylaxis. SCDs for DVT prophylaxis.  Code Status: Full Family Communication: Updated patient and husband at bedside. Disposition Plan: Home when medically stable and hemoglobin is stable and per GI.   Consultants:  Gastroenterology: Dr. Paulita Fujita 10/06/2015  Procedures:  2 units packed red blood cells 10/05/2015  Chest x-ray 10/05/2015  Antibiotics:  None  HPI/Subjective: Patient states she's feeling better. No shortness of breath. No chest pain.  Objective: Filed Vitals:   10/06/15 0750  BP: 102/47  Pulse: 58  Temp: 98.1 F (36.7 C)  Resp: 18    Intake/Output Summary (Last 24 hours) at 10/06/15 1328 Last data filed at 10/06/15 1313  Gross per 24 hour  Intake 2032.5 ml  Output   3700 ml  Net -1667.5 ml   Filed Weights   10/05/15 1218 10/05/15 2047  Weight:  61.236 kg (135 lb) 61.281 kg (135 lb 1.6 oz)    Exam:   General:  NAD  Cardiovascular: RRR  Respiratory: CTAB  Abdomen: Soft, nontender, nondistended, positive bowel sounds.  Musculoskeletal: No clubbing cyanosis. Right lower extremity wrapped in bandage.  Data Reviewed: Basic Metabolic Panel:  Recent Labs Lab 10/05/15 1231 10/05/15 1239 10/06/15 0905  NA  --  134* 138  K  --  3.8 3.3*  CL  --  95* 102  CO2  --  25 26  GLUCOSE  --  109* 89  BUN  --  31* 18  CREATININE  --  1.33* 1.07*  CALCIUM  --  9.1 8.7*  MG 2.3  --   --    Liver Function Tests: No results for input(s): AST, ALT, ALKPHOS, BILITOT, PROT, ALBUMIN in the last 168 hours. No results for input(s): LIPASE, AMYLASE in the last 168 hours. No results for input(s): AMMONIA in the last 168 hours. CBC:  Recent Labs Lab 10/05/15 1239 10/06/15 0905  WBC 6.5 5.0  HGB 6.0* 7.8*  HCT 21.1* 26.7*  MCV 79.9 80.9  PLT 353 327   Cardiac Enzymes: No results for input(s): CKTOTAL, CKMB, CKMBINDEX, TROPONINI in the last 168 hours. BNP (last 3 results)  Recent Labs  02/22/15 1230  BNP 303.8*    ProBNP (last 3 results)  Recent Labs  09/06/15 1221  PROBNP 305.0*    CBG: No results for input(s): GLUCAP in the last 168 hours.  Recent Results (from the past 240 hour(s))  MRSA PCR Screening     Status: Abnormal   Collection Time: 10/05/15  6:30 PM  Result Value Ref Range Status   MRSA by  PCR POSITIVE (A) NEGATIVE Final    Comment:        The GeneXpert MRSA Assay (FDA approved for NASAL specimens only), is one component of a comprehensive MRSA colonization surveillance program. It is not intended to diagnose MRSA infection nor to guide or monitor treatment for MRSA infections. RESULT CALLED TO, READ BACK BY AND VERIFIED WITH: Armando Reichert RN 2006 10/05/15 A BROWNING      Studies: Dg Chest 2 View  10/05/2015   CLINICAL DATA:  Weakness and fatigue.  Anemia.  EXAM: CHEST  2 VIEW  COMPARISON:   Chest CTs 06/15/2013  FINDINGS: The heart is enlarged but stable. There is tortuosity and calcification of the thoracic aorta. The lungs demonstrate emphysematous changes and pulmonary scarring. Patchy basilar density, left greater than right suspicious for infiltrate. The bony thorax is intact.  IMPRESSION: Stable cardiac enlargement.  Emphysematous changes and pulmonary scarring.  Suspect bibasilar infiltrates, left greater than right.   Electronically Signed   By: Marijo Sanes M.D.   On: 10/05/2015 17:40    Scheduled Meds: . acidophilus  1 capsule Oral q morning - 10a  . antiseptic oral rinse  7 mL Mouth Rinse BID  . atorvastatin  10 mg Oral Daily  . bisacodyl  10 mg Oral Once  . Chlorhexidine Gluconate Cloth  6 each Topical Q0600  . cholecalciferol  2,000 Units Oral QPM  . citalopram  20 mg Oral Daily  . cyanocobalamin  500 mcg Oral q morning - 10a  . fluticasone  2 spray Each Nare Daily  . Macitentan  10 mg Oral Daily  . multivitamin with minerals  1 tablet Oral Daily  . mupirocin ointment  1 application Nasal BID  . omega-3 acid ethyl esters  1 g Oral Daily  . pantoprazole (PROTONIX) IV  40 mg Intravenous Q12H  . peg 3350 powder  0.5 kit Oral Once   And  . [START ON 10/07/2015] peg 3350 powder  0.5 kit Oral Once  . predniSONE  5 mg Oral Q breakfast  . sildenafil  40 mg Oral TID  . sodium chloride  3 mL Intravenous Q12H  . spironolactone  25 mg Oral Daily  . torsemide  40 mg Oral BID   Continuous Infusions: . sodium chloride 75 mL/hr at 10/06/15 0606    Principal Problem:   Symptomatic anemia Active Problems:   Peripheral arterial occlusive disease (HCC)   Scleroderma, circumscribed or localized   Essential hypertension   Pulmonary hypertension associated with systemic disorder ([place])   Anemia    Time spent: 35 minutes    [facility] MD [facility] Pager [phone]. If 7PM-7AM, please contact night-coverage at [email or web address], password [facility] 10/06/2015, 1:28  PM  LOS: 1 day

## 2015-10-06 NOTE — Progress Notes (Signed)
OT Cancellation Note  Patient Details Name: [name] [name] MRN: [id number] DOB: 07-11-1951   Cancelled Treatment:    Reason Eval/Treat Not Completed: OT screened, no needs identified, will sign off  [place], Thereasa Parkin 10/06/2015, 12:24 PM

## 2015-10-06 NOTE — Progress Notes (Signed)
PT Cancellation Note  Patient Details Name: [name] [name] MRN: [id number] DOB: [birth year]   Cancelled Treatment:    Reason Eval/Treat Not Completed: PT screened, no needs identified, will sign off.  Pt up in room ambulating without assist. Independent and reports no LOB or issues.    Irwin Brakeman F 10/06/2015, 9:57 AM [facility] [phone] (pager)

## 2015-10-06 NOTE — Consult Note (Signed)
[facility] Gastroenterology Consultation Note  Referring Provider: Dr. Irine Seal [facility]) Primary Care Physician:  Hoyt Koch, MD  Reason for Consultation:  Iron-deficiency anemia  HPI: [name] [name] is a 64 y.o. female whom we've been asked to see for iron-deficiency anemia.  Patient has history of scleroderma and pulmonary hypertension, recently started on clopidigrel.  She has had progressive weakness and fatigue over the past three weeks.  She had some transient black stools which she was taking iron supplementation, but stopped iron supplement and no long has black stool.  She denies nausea, vomiting, GERD, dysphagia, abdominal pain, change in bowel habits, hematemesis, blood in stool, loss-of-appetite, unintentional weight loss.  Describes having endoscopy and colonoscopy about 10 years ago, unclear reason, normal per patient recollection.     Past Medical History  Diagnosis Date  . History of chicken pox   . Hypertension   . 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 Non Hodgkin's lymphoma ([place])     Treated with chemo 2010, felt to be cured.  . Melanoma ([place]) 1999  . Peripheral arterial occlusive disease ([place])   . 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 DOE (dyspnea on exertion)   . PAH (pulmonary artery hypertension) ([place])   . Epistaxis   . Pulmonary hypertension [facility])     Past Surgical History  Procedure Laterality Date  . Rt little toe removal  10/1998  . Stent left thigh  3/11  . Tonsillectomy and adenoidectomy    . Small intestine surgery    . Biopsy on cerebellum    . Vein ligation and stripping  05/16/2012    Procedure: VEIN LIGATION AND STRIPPING;  Surgeon: [name], MD;  Location: [facility] OR;  Service: Vascular;  Laterality: Right;  Irrigation and Debridement right lower leg, ligation of saphenous vein.  . Endovenous ablation saphenous vein w/ laser  07-24-2012    right greater saphenous vein by [name], M.D.   . Endovenous ablation saphenous vein w/ laser  10-16-2012     left greater saphenous vein  by Dr. [name]  . Apligraph  12-04-2102,   12-18-2012    bilateral calves   [name]  . Tunneled venous catheter placement  01/2013  . Right heart catheterization N/A 11/14/2012    Procedure: RIGHT HEART CATH;  Surgeon: [name], MD;  Location: [facility] CATH LAB;  Service: Cardiovascular;  Laterality: N/A;  . Endovenous ablation saphenous vein w/ laser Right 09-01-2015    endovenous laser ablation right greater saphenous vein by [name] MD    Prior to Admission medications   Medication Sig Start Date End Date Taking? Authorizing Provider  acidophilus (RISAQUAD) CAPS Take 1 capsule by mouth every morning.    Yes Historical Provider, MD  aspirin 81 MG tablet Take 81 mg by mouth every evening.    Yes Historical Provider, MD  atorvastatin (LIPITOR) 10 MG tablet Take 1 tablet (10 mg total) by mouth daily. 06/20/15  Yes [name], MD  BIOTIN PO Take 1 tablet by mouth daily.   Yes Historical Provider, MD  cholecalciferol (VITAMIN D) 1000 UNITS tablet Take 2,000 Units by mouth every evening.    Yes Historical Provider, MD  citalopram (CELEXA) 20 MG tablet take 1 tablet by mouth once daily Patient taking differently: take 1 tablet by mouth once daily at bedtime 06/29/15  Yes Hoyt Koch, MD  clopidogrel (PLAVIX) 75 MG tablet take 1 tablet by mouth once daily 05/10/15  Yes Real Cons  Sharlet Salina, MD  fish oil-omega-3 fatty acids 1000 MG capsule Take 1 g by mouth daily with lunch.    Yes Historical Provider, MD  gabapentin (NEURONTIN) 300 MG capsule take 1 capsule by mouth every morning and 2 every evening Patient taking differently: Take 300-600 mg by mouth 2 (two) times daily. take 1 capsule by mouth every morning and 2 every evening 04/21/15  Yes Hoyt Koch, MD  Macitentan (OPSUMIT) 10 MG TABS Take 10 mg by mouth daily. 02/22/15  Yes Larey Dresser, MD  mometasone (NASONEX) 50 MCG/ACT nasal spray Place 2 sprays into the  nose daily. 06/15/15  Yes Hoyt Koch, MD  moxifloxacin (VIGAMOX) 0.5 % ophthalmic solution Place 1 drop into both eyes 3 (three) times daily as needed (allergies).    Yes Historical Provider, MD  multivitamin [facility]) per tablet Take 2 tablets by mouth 2 (two) times daily.    Yes Historical Provider, MD  OXYGEN Inhale 2 L into the lungs continuous.   Yes Historical Provider, MD  pantoprazole (PROTONIX) 40 MG tablet take 1 tablet by mouth once daily 04/26/15  Yes Hoyt Koch, MD  predniSONE (DELTASONE) 5 MG tablet take 1 tablet by mouth once daily WITH BREAKFAST 08/10/15  Yes Collene Gobble, MD  sildenafil (REVATIO) 20 MG tablet Take 2 tablets (40 mg total) by mouth 3 (three) times daily. 02/08/15  Yes [name], MD  spironolactone (ALDACTONE) 25 MG tablet Take 1 tablet (25 mg total) by mouth daily. 07/13/15  Yes Hoyt Koch, MD  torsemide (DEMADEX) 20 MG tablet Take 2 tablets (40 mg total) by mouth 2 (two) times daily. 09/14/15  Yes [name], MD  vitamin B-12 (CYANOCOBALAMIN) 500 MCG tablet Take 500 mcg by mouth every morning.    Yes Historical Provider, MD  PredniSONE 2 MG TBEC Take 2 mg by mouth daily. Patient not taking: Reported on 10/05/2015 04/26/15   Collene Gobble, MD    Current Facility-Administered Medications  Medication Dose Route Frequency Provider Last Rate Last Dose  . 0.9 %  sodium chloride infusion   Intravenous Continuous Eugenie Filler, MD 75 mL/hr at 10/06/15 0606    . acetaminophen (TYLENOL) tablet 650 mg  650 mg Oral Q6H PRN Eugenie Filler, MD       Or  . acetaminophen (TYLENOL) suppository 650 mg  650 mg Rectal Q6H PRN Eugenie Filler, MD      . acidophilus (RISAQUAD) capsule 1 capsule  1 capsule Oral q morning - 10a Eugenie Filler, MD      . albuterol (PROVENTIL) (2.5 MG/3ML) 0.083% nebulizer solution 2.5 mg  2.5 mg Nebulization Q2H PRN Eugenie Filler, MD      . alum & mag hydroxide-simeth (MAALOX/MYLANTA) 200-200-20  MG/5ML suspension 30 mL  30 mL Oral Q6H PRN Eugenie Filler, MD      . antiseptic oral rinse (CPC / CETYLPYRIDINIUM CHLORIDE 0.05%) solution 7 mL  7 mL Mouth Rinse BID Eugenie Filler, MD   7 mL at 10/05/15 2129  . atorvastatin (LIPITOR) tablet 10 mg  10 mg Oral Daily Eugenie Filler, MD      . Chlorhexidine Gluconate Cloth 2 % PADS 6 each  6 each Topical Q0600 Eugenie Filler, MD   6 each at 10/06/15 0700  . cholecalciferol (VITAMIN D) tablet 2,000 Units  2,000 Units Oral QPM Eugenie Filler, MD   2,000 Units at 10/05/15 2129  . citalopram (CELEXA) tablet 20  mg  20 mg Oral Daily Eugenie Filler, MD   20 mg at 10/05/15 2129  . cyanocobalamin tablet 500 mcg  500 mcg Oral q morning - 10a Eugenie Filler, MD      . fluticasone [facility]) 50 MCG/ACT nasal spray 2 spray  2 spray Each Nare Daily Eugenie Filler, MD      . Macitentan TABS 10 mg  10 mg Oral Daily Eugenie Filler, MD   10 mg at 10/05/15 1800  . multivitamin with minerals tablet 1 tablet  1 tablet Oral Daily Eugenie Filler, MD      . mupirocin ointment (BACTROBAN) 2 % 1 application  1 application Nasal BID Irine Seal V, MD      . omega-3 acid ethyl esters (LOVAZA) capsule 1 g  1 g Oral Daily Eugenie Filler, MD   1 g at 10/05/15 2130  . ondansetron (ZOFRAN) tablet 4 mg  4 mg Oral Q6H PRN Eugenie Filler, MD       Or  . ondansetron [facility]) injection 4 mg  4 mg Intravenous Q6H PRN Eugenie Filler, MD      . pantoprazole (PROTONIX) injection 40 mg  40 mg Intravenous Q12H Eugenie Filler, MD   40 mg at 10/05/15 2130  . predniSONE (DELTASONE) tablet 5 mg  5 mg Oral Q breakfast Eugenie Filler, MD   5 mg at 10/06/15 0802  . senna-docusate (Senokot-S) tablet 1 tablet  1 tablet Oral QHS PRN Eugenie Filler, MD      . sildenafil (REVATIO) tablet 40 mg  40 mg Oral TID Eugenie Filler, MD   40 mg at 10/05/15 2128  . sodium chloride 0.9 % injection 3 mL  3 mL Intravenous Q12H Irine Seal V, MD      .  sorbitol 70 % solution 30 mL  30 mL Oral Daily PRN Eugenie Filler, MD      . spironolactone (ALDACTONE) tablet 25 mg  25 mg Oral Daily Irine Seal V, MD      . torsemide [facility]) tablet 40 mg  40 mg Oral BID Eugenie Filler, MD   40 mg at 10/06/15 0802    Allergies as of 10/05/2015 - Review Complete 10/05/2015  Allergen Reaction Noted  . Sulfa antibiotics  02/07/2012  . Levaquin [levofloxacin in d5w] Other (See Comments) 05/28/2013  . Penicillins Rash 08/10/2011    Family History  Problem Relation Age of Onset  . Lupus Father   . Lymphoma Father   . Hyperlipidemia Mother   . Cancer Daughter     sarcoma  . Colon cancer      Social History   Social History  . Marital Status: Married    Spouse Name: N/A  . Number of Children: 1  . Years of Education: N/A   Occupational History  . homemaker    Social History Main Topics  . Smoking status: Never Smoker   . Smokeless tobacco: Never Used  . Alcohol Use: 0.0 oz/week    0 Standard drinks or equivalent per week     Comment: Occasional  . Drug Use: No  . Sexual Activity: Not Currently   Other Topics Concern  . Not on file   Social History Narrative    Review of Systems: ROS Dr. [place] Silos reviewed and I agree  Physical Exam: Vital signs in last 24 hours: Temp:  [97.8 F (36.6 C)-98.6 F (37 C)] 98.1 F (36.7 C) (10/06 0750) Pulse  Rate:  [58-78] 58 (10/06 0750) Resp:  [14-21] 18 (10/06 0750) BP: (101-134)/(39-57) 102/47 mmHg (10/06 0750) SpO2:  [91 %-100 %] 95 % (10/06 0750) Weight:  [61.236 kg (135 lb)-61.281 kg (135 lb 1.6 oz)] 61.281 kg (135 lb 1.6 oz) (10/05 2047) Last BM Date: 10/04/15 General:   Alert,  Well-developed, well-nourished, pleasant and cooperative in NAD Head:  Normocephalic and atraumatic. Eyes:  Sclera clear, no icterus.   Conjunctiva pink. Ears:  Normal auditory acuity. Nose:  No deformity, discharge,  or lesions. Mouth:  No deformity or lesions.  Oropharynx pink & moist. Neck:   Supple; no masses or thyromegaly. Lungs:  Clear throughout to auscultation.   No wheezes, crackles, or rhonchi. No acute distress. Heart:  Regular rate and rhythm; no murmurs, clicks, rubs,  or gallops. Abdomen:  Soft, nontender and nondistended. No masses, hepatosplenomegaly or hernias noted. Normal bowel sounds, without guarding, and without rebound.     Msk:  Somewhat diffusely atrophic; deformities as described below. Normal posture. Pulses: Poor peripheral pulses Extremities:  Upper and lower extremity digital and joint deformities with also signs of chronic ischemic necrosis of digital tips; wrapping on right lower extremity; Without clubbing or edema. Neurologic:  Alert and  oriented x4; diffusely weak, but otherwise grossly normal neurologically. Skin:  Scattered telangiectasias on chest and back; sclerotic texturing of skin consistent with scleroderma; chronic distal digital ischemic necrosis changes Psych:  Alert and cooperative. Flat affect.  Depressed-appearing   Lab Results:  Recent Labs  10/05/15 1239 10/06/15 0905  WBC 6.5 5.0  HGB 6.0* 7.8*  HCT 21.1* 26.7*  PLT 353 327   BMET  Recent Labs  10/05/15 1239  NA 134*  K 3.8  CL 95*  CO2 25  GLUCOSE 109*  BUN 31*  CREATININE 1.33*  CALCIUM 9.1   LFT No results for input(s): PROT, ALBUMIN, AST, ALT, ALKPHOS, BILITOT, BILIDIR, IBILI in the last 72 hours. PT/INR No results for input(s): LABPROT, INR in the last 72 hours.  Studies/Results: Dg Chest 2 View  10/05/2015   CLINICAL DATA:  Weakness and fatigue.  Anemia.  EXAM: CHEST  2 VIEW  COMPARISON:  Chest CTs 06/15/2013  FINDINGS: The heart is enlarged but stable. There is tortuosity and calcification of the thoracic aorta. The lungs demonstrate emphysematous changes and pulmonary scarring. Patchy basilar density, left greater than right suspicious for infiltrate. The bony thorax is intact.  IMPRESSION: Stable cardiac enlargement.  Emphysematous changes and  pulmonary scarring.  Suspect bibasilar infiltrates, left greater than right.   Electronically Signed   By: Marijo Sanes M.D.   On: 10/05/2015 17:40   Impression:  1.  New onset iron-deficiency anemia.  On chronic clopidigrel and prednisone.  No overt GI bleeding. 2.  Black stools, seem related to iron intake, resolved after iron cessation. 3.  Chronic scleroderma without obvious GI tract symptoms (constipation, GERD).  Plan:  1.  Hold clopidigrel and blood thinners. 2.  Serial CBCs. 3.  Protonix. 4.  Endoscopy and colonoscopy tomorrow. 5.  Celiac serologies. 6.  Risks (bleeding, infection, bowel perforation that could require surgery, sedation-related changes in cardiopulmonary systems), benefits (identification and possible treatment of source of symptoms, exclusion of certain causes of symptoms), and alternatives (watchful waiting, radiographic imaging studies, empiric medical treatment) of upper endoscopy (EGD) were explained to patient/family in detail and patient wishes to proceed. 7.  Risks (bleeding, infection, bowel perforation that could require surgery, sedation-related changes in cardiopulmonary systems), benefits (identification and possible treatment of source  of symptoms, exclusion of certain causes of symptoms), and alternatives (watchful waiting, radiographic imaging studies, empiric medical treatment) of colonoscopy were explained to patient/family in detail and patient wishes to proceed.   LOS: 1 day   Quindon Denker M  10/06/2015, 9:26 AM  Pager [phone] If no answer or after 5 PM call [phone]

## 2015-10-06 NOTE — Progress Notes (Signed)
MEDICATION RELATED CONSULT NOTE - INITIAL   Pharmacy Consult for Iron Dextran Indication: iron deficient anemia  Patient Measurements: Height: _0  (172.7 cm) Weight: 135 lb 1.6 oz (61.281 kg) IBW/kg (Calculated) : 63.9 Adjusted Body Weight:  Vital Signs: Temp: 98.1 F (36.7 C) (10/06 0750) Temp Source: Oral (10/06 0750) BP: 102/47 mmHg (10/06 0750) Pulse Rate: 58 (10/06 0750) Intake/Output from previous day: 10/05 0701 - 10/06 0700 In: 1672.5 [P.O.:720; I.V.:252.5; Blood:700] Out: 2300 [Urine:2300] Intake/Output from this shift: Total I/O In: 952.5 [P.O.:360; I.V.:592.5] Out: 1600 [Urine:1600]  Labs:  Recent Labs  10/05/15 1231 10/05/15 1239 10/06/15 0905 10/06/15 1500  WBC  --  6.5 5.0 6.7  HGB  --  6.0* 7.8* 8.1*  HCT  --  21.1* 26.7* 27.1*  PLT  --  353 327 316  CREATININE  --  1.33* 1.07*  --   MG 2.3  --   --   --    Estimated Creatinine Clearance: 50 mL/min (by C-G formula based on Cr of 1.07).   Microbiology: Recent Results (from the past 720 hour(s))  MRSA PCR Screening     Status: Abnormal   Collection Time: 10/05/15  6:30 PM  Result Value Ref Range Status   MRSA by PCR POSITIVE (A) NEGATIVE Final    Comment:        The GeneXpert MRSA Assay (FDA approved for NASAL specimens only), is one component of a comprehensive MRSA colonization surveillance program. It is not intended to diagnose MRSA infection nor to guide or monitor treatment for MRSA infections. RESULT CALLED TO, READ BACK BY AND VERIFIED WITH: Armando Reichert RN 2006 10/05/15 A BROWNING     Medical History: Past Medical History  Diagnosis Date  . History of chicken pox   . Hypertension   . 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 Non Hodgkin's lymphoma ([place])     Treated with chemo 2010, felt to be cured.  . Melanoma ([place]) 1999  . Peripheral arterial occlusive disease ([place])   . 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[place])   . DOE (dyspnea on exertion)   . PAH (pulmonary artery hypertension) ([place])    . Epistaxis   . Pulmonary hypertension ([facility])     Medications:  Prescriptions prior to admission  Medication Sig Dispense Refill Last Dose  . acidophilus (RISAQUAD) CAPS Take 1 capsule by mouth every morning.    10/05/2015 at Unknown time  . aspirin 81 MG tablet Take 81 mg by mouth every evening.    10/04/2015 at Unknown time  . atorvastatin (LIPITOR) 10 MG tablet Take 1 tablet (10 mg total) by mouth daily. 30 tablet 3 10/05/2015 at Unknown time  . BIOTIN PO Take 1 tablet by mouth daily.   10/05/2015 at Unknown time  . cholecalciferol (VITAMIN D) 1000 UNITS tablet Take 2,000 Units by mouth every evening.    10/04/2015 at Unknown time  . citalopram (CELEXA) 20 MG tablet take 1 tablet by mouth once daily (Patient taking differently: take 1 tablet by mouth once daily at bedtime) 30 tablet 3 10/04/2015 at Unknown time  . clopidogrel (PLAVIX) 75 MG tablet take 1 tablet by mouth once daily 30 tablet 5 10/05/2015 at Unknown time  . fish oil-omega-3 fatty acids 1000 MG capsule Take 1 g by mouth daily with lunch.    10/04/2015 at Unknown time  . gabapentin (NEURONTIN) 300 MG capsule take 1 capsule by mouth every morning and 2 every evening (Patient taking differently: Take 300-600 mg by mouth 2 (two) times  daily. take 1 capsule by mouth every morning and 2 every evening) 90 capsule 11 10/05/2015 at Unknown time  . Macitentan (OPSUMIT) 10 MG TABS Take 10 mg by mouth daily. 30 tablet 6 10/05/2015 at Unknown time  . mometasone (NASONEX) 50 MCG/ACT nasal spray Place 2 sprays into the nose daily. 17 g 12 Past Month at Unknown time  . moxifloxacin (VIGAMOX) 0.5 % ophthalmic solution Place 1 drop into both eyes 3 (three) times daily as needed (allergies).    Past Week at Unknown time  . multivitamin (THERAGRAN) per tablet Take 2 tablets by mouth 2 (two) times daily.    10/05/2015 at Unknown time  . OXYGEN Inhale 2 L into the lungs continuous.   10/05/2015 at Unknown time  . pantoprazole (PROTONIX) 40 MG tablet take 1  tablet by mouth once daily 30 tablet 6 10/04/2015 at Unknown time  . predniSONE (DELTASONE) 5 MG tablet take 1 tablet by mouth once daily WITH BREAKFAST 30 tablet 5 10/05/2015 at Unknown time  . sildenafil (REVATIO) 20 MG tablet Take 2 tablets (40 mg total) by mouth 3 (three) times daily. 270 tablet 6 10/05/2015 at Unknown time  . spironolactone (ALDACTONE) 25 MG tablet Take 1 tablet (25 mg total) by mouth daily. 30 tablet 3 10/05/2015 at Unknown time  . torsemide (DEMADEX) 20 MG tablet Take 2 tablets (40 mg total) by mouth 2 (two) times daily. 120 tablet 3 10/05/2015 at Unknown time  . vitamin B-12 (CYANOCOBALAMIN) 500 MCG tablet Take 500 mcg by mouth every morning.    10/05/2015 at Unknown time  . PredniSONE 2 MG TBEC Take 2 mg by mouth daily. (Patient not taking: Reported on 10/05/2015) 30 tablet 1 Not Taking at Unknown time   Scheduled:  . acidophilus  1 capsule Oral q morning - 10a  . antiseptic oral rinse  7 mL Mouth Rinse BID  . atorvastatin  10 mg Oral Daily  . bisacodyl  10 mg Oral Once  . Chlorhexidine Gluconate Cloth  6 each Topical Q0600  . cholecalciferol  2,000 Units Oral QPM  . citalopram  20 mg Oral Daily  . cyanocobalamin  500 mcg Oral q morning - 10a  . fluticasone  2 spray Each Nare Daily  . Macitentan  10 mg Oral Daily  . multivitamin with minerals  1 tablet Oral Daily  . mupirocin ointment  1 application Nasal BID  . omega-3 acid ethyl esters  1 g Oral Daily  . pantoprazole (PROTONIX) IV  40 mg Intravenous Q12H  . peg 3350 powder  0.5 kit Oral Once   And  . [START ON 10/07/2015] peg 3350 powder  0.5 kit Oral Once  . predniSONE  5 mg Oral Q breakfast  . sildenafil  40 mg Oral TID  . sodium chloride  3 mL Intravenous Q12H  . spironolactone  25 mg Oral Daily  . torsemide  40 mg Oral BID   Infusions:  . sodium chloride 75 mL/hr at 10/06/15 1529    Assessment: 64yo female with history of PAH, scleroderma, non-Hodgkin's lymphoma, HTN and PAD presents with fatigue. Pharmacy  is consulted to dose IV iron for iron deficient anemia. Hgb 8.1, Fe 11, UIBC 511, TIBC 522, Sat Ratios 2, Ferritin 8, Folate 53.1 and VitB 1061. Pt has received 2u of PRBCs.  Goal of Therapy:  Improved Hgb  Plan:  Iron dextran 74m test dose followed by 1g IV once Monitor CBC F/u future anemia panel  CAndrey Cota BDiona Foley PharmD Clinical Pharmacist  Pager [phone] 10/06/2015,3:31 PM

## 2015-10-07 ENCOUNTER — Encounter (HOSPITAL_COMMUNITY)
Admission: EM | Disposition: A | Discharge status: Home / Self Care | Payer: Self-pay | Source: Home / Self Care | Attending: Internal Medicine

## 2015-10-07 ENCOUNTER — Inpatient Hospital Stay (HOSPITAL_COMMUNITY): Payer: BLUE CROSS/BLUE SHIELD | Admitting: Anesthesiology

## 2015-10-07 ENCOUNTER — Encounter (HOSPITAL_COMMUNITY): Payer: Self-pay | Admitting: *Deleted

## 2015-10-07 DIAGNOSIS — E876 Hypokalemia: Secondary | ICD-10-CM | POA: Diagnosis not present

## 2015-10-07 HISTORY — PX: ESOPHAGOGASTRODUODENOSCOPY (EGD) WITH PROPOFOL: SHX5813

## 2015-10-07 HISTORY — PX: COLONOSCOPY WITH PROPOFOL: SHX5780

## 2015-10-07 LAB — CBC
HCT: 30.4 % — ABNORMAL LOW (ref 36.0–46.0)
HCT: 26.5 % — ABNORMAL LOW (ref 36.0–46.0)
Hemoglobin: 9 g/dL — ABNORMAL LOW (ref 12.0–15.0)
Hemoglobin: 7.7 g/dL — ABNORMAL LOW (ref 12.0–15.0)
MCH: 24.1 pg — ABNORMAL LOW (ref 26.0–34.0)
MCH: 23.9 pg — ABNORMAL LOW (ref 26.0–34.0)
MCHC: 29.6 g/dL — ABNORMAL LOW (ref 30.0–36.0)
MCHC: 29.1 g/dL — ABNORMAL LOW (ref 30.0–36.0)
MCV: 81.3 fL (ref 78.0–100.0)
MCV: 82.3 fL (ref 78.0–100.0)
Platelets: 383 K/uL (ref 150–400)
Platelets: 311 K/uL (ref 150–400)
RBC: 3.74 MIL/uL — ABNORMAL LOW (ref 3.87–5.11)
RBC: 3.22 MIL/uL — ABNORMAL LOW (ref 3.87–5.11)
RDW: 19 % — ABNORMAL HIGH (ref 11.5–15.5)
RDW: 19.1 % — ABNORMAL HIGH (ref 11.5–15.5)
WBC: 6.8 K/uL (ref 4.0–10.5)
WBC: 5.6 K/uL (ref 4.0–10.5)

## 2015-10-07 LAB — BASIC METABOLIC PANEL WITH GFR
Anion gap: 14 (ref 5–15)
BUN: 12 mg/dL (ref 6–20)
CO2: 24 mmol/L (ref 22–32)
Calcium: 9 mg/dL (ref 8.9–10.3)
Chloride: 100 mmol/L — ABNORMAL LOW (ref 101–111)
Creatinine, Ser: 1.14 mg/dL — ABNORMAL HIGH (ref 0.44–1.00)
GFR calc Af Amer: 57 mL/min — ABNORMAL LOW
GFR calc non Af Amer: 49 mL/min — ABNORMAL LOW
Glucose, Bld: 82 mg/dL (ref 65–99)
Potassium: 3.3 mmol/L — ABNORMAL LOW (ref 3.5–5.1)
Sodium: 138 mmol/L (ref 135–145)

## 2015-10-07 LAB — TYPE AND SCREEN
ABO/RH(D): B POS
Antibody Screen: NEGATIVE
Unit division: 0
Unit division: 0

## 2015-10-07 SURGERY — ESOPHAGOGASTRODUODENOSCOPY (EGD) WITH PROPOFOL
Anesthesia: Monitor Anesthesia Care | Laterality: Left

## 2015-10-07 MED ORDER — PROPOFOL 500 MG/50ML IV EMUL
INTRAVENOUS | Status: DC | PRN
  Administered 2015-10-07: 75 ug/kg/min via INTRAVENOUS

## 2015-10-07 MED ORDER — LACTATED RINGERS IV SOLN
INTRAVENOUS | Status: DC | PRN
  Administered 2015-10-07 (×2): via INTRAVENOUS

## 2015-10-07 MED ORDER — BUTAMBEN-TETRACAINE-BENZOCAINE 2-2-14 % EX AERO
INHALATION_SPRAY | CUTANEOUS | Status: DC | PRN
  Administered 2015-10-07: 2 via TOPICAL

## 2015-10-07 MED ORDER — FENTANYL CITRATE (PF) 100 MCG/2ML IJ SOLN
INTRAMUSCULAR | Status: DC | PRN
  Administered 2015-10-07: 50 ug via INTRAVENOUS
  Administered 2015-10-07: 25 ug via INTRAVENOUS
  Administered 2015-10-07: 50 ug via INTRAVENOUS

## 2015-10-07 MED ORDER — PHENYLEPHRINE HCL 10 MG/ML IJ SOLN
INTRAMUSCULAR | Status: DC | PRN
  Administered 2015-10-07 (×4): 80 ug via INTRAVENOUS

## 2015-10-07 MED ORDER — POTASSIUM CHLORIDE CRYS ER 20 MEQ PO TBCR
40.0000 meq | EXTENDED_RELEASE_TABLET | Freq: Once | ORAL | Status: AC
  Administered 2015-10-07: 40 meq via ORAL
  Filled 2015-10-07: qty 2

## 2015-10-07 MED ORDER — FLUCONAZOLE 100 MG PO TABS
200.0000 mg | ORAL_TABLET | Freq: Every day | ORAL | Status: DC
  Administered 2015-10-07 – 2015-10-08 (×2): 200 mg via ORAL
  Filled 2015-10-07 (×2): qty 2

## 2015-10-07 MED ORDER — MIDAZOLAM HCL 5 MG/5ML IJ SOLN
INTRAMUSCULAR | Status: DC | PRN
  Administered 2015-10-07: 1 mg via INTRAVENOUS

## 2015-10-07 MED ORDER — PROPOFOL 10 MG/ML IV BOLUS
INTRAVENOUS | Status: DC | PRN
  Administered 2015-10-07 (×2): 20 mg via INTRAVENOUS

## 2015-10-07 NOTE — Op Note (Signed)
[facility] [place], [id number]   ENDOSCOPY PROCEDURE REPORT  PATIENT: [name] [name], [name] [name]  MR#: [id number] BIRTHDATE: 04-13-1951 , 64  yrs. old GENDER: female ENDOSCOPIST:Kolbey Teichert Oletta Lamas, MD REFERRED BY: [facility]. PCP: Dr. Pricilla Holm PROCEDURE DATE:  10/07/2015 PROCEDURE:   EGD with brushing ASA CLASS:    class III INDICATIONS: anemia  positive stool MEDICATION: see colonoscopy report TOPICAL ANESTHETIC:   Cetacaine Spray  DESCRIPTION OF PROCEDURE:   After the risks and benefits of the procedure were explained, informed consent was obtained.  The Pentax Gastroscope [id number]  endoscope was introduced through the mouth  and advanced to the second portion of the duodenum .  The instrument was slowly withdrawn as the mucosa was fully examined. Estimated blood loss is zero unless otherwise noted in this procedure report.there was what appeared to be fungus in the esophagus and a brush and was obtained for fungal smear. The petition was somewhat probable. There was no gross ulceration, varices etc. The scope was passed into the duodenum and the duodenal bulb was normal with no ulceration or inflammation in the 2nd duodenum was normal as well. The stomach was examined in the forward and retro flex view and was normal. Scope was withdrawn in the patient tolerated the procedure well.    The scope was then withdrawn from the patient and the procedure completed.  COMPLICATIONS: There were no immediate complications.  ENDOSCOPIC IMPRESSION: 1. Possible fungal esophagitis. 2. Heme positive stool anemia without clear calls on EGD RECOMMENDATIONS: will empirically treated with Diflucan. Will proceed with colonoscopy   _______________________________ eSigned:  [name], MD 10/07/2015 1:29 PM     cc: Dr. Pricilla Holm  CPT CODES: ICD CODES:  The ICD and CPT codes recommended by this software are interpretations  from the data that the clinical staff has captured with the software.  The verification of the translation of this report to the ICD and CPT codes and modifiers is the sole responsibility of the health care institution and practicing physician where this report was generated.  [place]. will not be held responsible for the validity of the ICD and CPT codes included on this report.  AMA assumes no liability for data contained or not contained herein. CPT is a Designer, television/film set of the [employer].  PATIENT NAME:  [name] [name], [name] [name] MR#: [id number]

## 2015-10-07 NOTE — H&P (View-Only) (Signed)
[facility] Gastroenterology Consultation Note  Referring Provider: Dr. Irine Seal [facility]) Primary Care Physician:  Hoyt Koch, MD  Reason for Consultation:  Iron-deficiency anemia  HPI: [name] [name] is a 64 y.o. female whom we've been asked to see for iron-deficiency anemia.  Patient has history of scleroderma and pulmonary hypertension, recently started on clopidigrel.  She has had progressive weakness and fatigue over the past three weeks.  She had some transient black stools which she was taking iron supplementation, but stopped iron supplement and no long has black stool.  She denies nausea, vomiting, GERD, dysphagia, abdominal pain, change in bowel habits, hematemesis, blood in stool, loss-of-appetite, unintentional weight loss.  Describes having endoscopy and colonoscopy about 10 years ago, unclear reason, normal per patient recollection.     Past Medical History  Diagnosis Date  . History of chicken pox   . Hypertension   . 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 Non Hodgkin's lymphoma ([place])     Treated with chemo 2010, felt to be cured.  . Melanoma ([place]) 1999  . Peripheral arterial occlusive disease ([place])   . 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 DOE (dyspnea on exertion)   . PAH (pulmonary artery hypertension) ([place])   . Epistaxis   . Pulmonary hypertension [facility])     Past Surgical History  Procedure Laterality Date  . Rt little toe removal  10/1998  . Stent left thigh  3/11  . Tonsillectomy and adenoidectomy    . Small intestine surgery    . Biopsy on cerebellum    . Vein ligation and stripping  05/16/2012    Procedure: VEIN LIGATION AND STRIPPING;  Surgeon: [name], MD;  Location: [facility] OR;  Service: Vascular;  Laterality: Right;  Irrigation and Debridement right lower leg, ligation of saphenous vein.  . Endovenous ablation saphenous vein w/ laser  07-24-2012    right greater saphenous vein by [name], M.D.   . Endovenous ablation saphenous vein w/ laser  10-16-2012     left greater saphenous vein  by Dr. [name]  . Apligraph  12-04-2102,   12-18-2012    bilateral calves   [name]  . Tunneled venous catheter placement  01/2013  . Right heart catheterization N/A 11/14/2012    Procedure: RIGHT HEART CATH;  Surgeon: [name], MD;  Location: [facility] CATH LAB;  Service: Cardiovascular;  Laterality: N/A;  . Endovenous ablation saphenous vein w/ laser Right 09-01-2015    endovenous laser ablation right greater saphenous vein by [name] MD    Prior to Admission medications   Medication Sig Start Date End Date Taking? Authorizing Provider  acidophilus (RISAQUAD) CAPS Take 1 capsule by mouth every morning.    Yes Historical Provider, MD  aspirin 81 MG tablet Take 81 mg by mouth every evening.    Yes Historical Provider, MD  atorvastatin (LIPITOR) 10 MG tablet Take 1 tablet (10 mg total) by mouth daily. 06/20/15  Yes [name], MD  BIOTIN PO Take 1 tablet by mouth daily.   Yes Historical Provider, MD  cholecalciferol (VITAMIN D) 1000 UNITS tablet Take 2,000 Units by mouth every evening.    Yes Historical Provider, MD  citalopram (CELEXA) 20 MG tablet take 1 tablet by mouth once daily Patient taking differently: take 1 tablet by mouth once daily at bedtime 06/29/15  Yes Hoyt Koch, MD  clopidogrel (PLAVIX) 75 MG tablet take 1 tablet by mouth once daily 05/10/15  Yes Real Cons  Sharlet Salina, MD  fish oil-omega-3 fatty acids 1000 MG capsule Take 1 g by mouth daily with lunch.    Yes Historical Provider, MD  gabapentin (NEURONTIN) 300 MG capsule take 1 capsule by mouth every morning and 2 every evening Patient taking differently: Take 300-600 mg by mouth 2 (two) times daily. take 1 capsule by mouth every morning and 2 every evening 04/21/15  Yes Hoyt Koch, MD  Macitentan (OPSUMIT) 10 MG TABS Take 10 mg by mouth daily. 02/22/15  Yes Larey Dresser, MD  mometasone (NASONEX) 50 MCG/ACT nasal spray Place 2 sprays into the  nose daily. 06/15/15  Yes Hoyt Koch, MD  moxifloxacin (VIGAMOX) 0.5 % ophthalmic solution Place 1 drop into both eyes 3 (three) times daily as needed (allergies).    Yes Historical Provider, MD  multivitamin [facility]) per tablet Take 2 tablets by mouth 2 (two) times daily.    Yes Historical Provider, MD  OXYGEN Inhale 2 L into the lungs continuous.   Yes Historical Provider, MD  pantoprazole (PROTONIX) 40 MG tablet take 1 tablet by mouth once daily 04/26/15  Yes Hoyt Koch, MD  predniSONE (DELTASONE) 5 MG tablet take 1 tablet by mouth once daily WITH BREAKFAST 08/10/15  Yes Collene Gobble, MD  sildenafil (REVATIO) 20 MG tablet Take 2 tablets (40 mg total) by mouth 3 (three) times daily. 02/08/15  Yes [name], MD  spironolactone (ALDACTONE) 25 MG tablet Take 1 tablet (25 mg total) by mouth daily. 07/13/15  Yes Hoyt Koch, MD  torsemide (DEMADEX) 20 MG tablet Take 2 tablets (40 mg total) by mouth 2 (two) times daily. 09/14/15  Yes [name], MD  vitamin B-12 (CYANOCOBALAMIN) 500 MCG tablet Take 500 mcg by mouth every morning.    Yes Historical Provider, MD  PredniSONE 2 MG TBEC Take 2 mg by mouth daily. Patient not taking: Reported on 10/05/2015 04/26/15   Collene Gobble, MD    Current Facility-Administered Medications  Medication Dose Route Frequency Provider Last Rate Last Dose  . 0.9 %  sodium chloride infusion   Intravenous Continuous Eugenie Filler, MD 75 mL/hr at 10/06/15 0606    . acetaminophen (TYLENOL) tablet 650 mg  650 mg Oral Q6H PRN Eugenie Filler, MD       Or  . acetaminophen (TYLENOL) suppository 650 mg  650 mg Rectal Q6H PRN Eugenie Filler, MD      . acidophilus (RISAQUAD) capsule 1 capsule  1 capsule Oral q morning - 10a Eugenie Filler, MD      . albuterol (PROVENTIL) (2.5 MG/3ML) 0.083% nebulizer solution 2.5 mg  2.5 mg Nebulization Q2H PRN Eugenie Filler, MD      . alum & mag hydroxide-simeth (MAALOX/MYLANTA) 200-200-20  MG/5ML suspension 30 mL  30 mL Oral Q6H PRN Eugenie Filler, MD      . antiseptic oral rinse (CPC / CETYLPYRIDINIUM CHLORIDE 0.05%) solution 7 mL  7 mL Mouth Rinse BID Eugenie Filler, MD   7 mL at 10/05/15 2129  . atorvastatin (LIPITOR) tablet 10 mg  10 mg Oral Daily Eugenie Filler, MD      . Chlorhexidine Gluconate Cloth 2 % PADS 6 each  6 each Topical Q0600 Eugenie Filler, MD   6 each at 10/06/15 0700  . cholecalciferol (VITAMIN D) tablet 2,000 Units  2,000 Units Oral QPM Eugenie Filler, MD   2,000 Units at 10/05/15 2129  . citalopram (CELEXA) tablet 20  mg  20 mg Oral Daily Eugenie Filler, MD   20 mg at 10/05/15 2129  . cyanocobalamin tablet 500 mcg  500 mcg Oral q morning - 10a Eugenie Filler, MD      . fluticasone [facility]) 50 MCG/ACT nasal spray 2 spray  2 spray Each Nare Daily Eugenie Filler, MD      . Macitentan TABS 10 mg  10 mg Oral Daily Eugenie Filler, MD   10 mg at 10/05/15 1800  . multivitamin with minerals tablet 1 tablet  1 tablet Oral Daily Eugenie Filler, MD      . mupirocin ointment (BACTROBAN) 2 % 1 application  1 application Nasal BID Irine Seal V, MD      . omega-3 acid ethyl esters (LOVAZA) capsule 1 g  1 g Oral Daily Eugenie Filler, MD   1 g at 10/05/15 2130  . ondansetron (ZOFRAN) tablet 4 mg  4 mg Oral Q6H PRN Eugenie Filler, MD       Or  . ondansetron [facility]) injection 4 mg  4 mg Intravenous Q6H PRN Eugenie Filler, MD      . pantoprazole (PROTONIX) injection 40 mg  40 mg Intravenous Q12H Eugenie Filler, MD   40 mg at 10/05/15 2130  . predniSONE (DELTASONE) tablet 5 mg  5 mg Oral Q breakfast Eugenie Filler, MD   5 mg at 10/06/15 0802  . senna-docusate (Senokot-S) tablet 1 tablet  1 tablet Oral QHS PRN Eugenie Filler, MD      . sildenafil (REVATIO) tablet 40 mg  40 mg Oral TID Eugenie Filler, MD   40 mg at 10/05/15 2128  . sodium chloride 0.9 % injection 3 mL  3 mL Intravenous Q12H Irine Seal V, MD      .  sorbitol 70 % solution 30 mL  30 mL Oral Daily PRN Eugenie Filler, MD      . spironolactone (ALDACTONE) tablet 25 mg  25 mg Oral Daily Irine Seal V, MD      . torsemide [facility]) tablet 40 mg  40 mg Oral BID Eugenie Filler, MD   40 mg at 10/06/15 0802    Allergies as of 10/05/2015 - Review Complete 10/05/2015  Allergen Reaction Noted  . Sulfa antibiotics  02/07/2012  . Levaquin [levofloxacin in d5w] Other (See Comments) 05/28/2013  . Penicillins Rash 08/10/2011    Family History  Problem Relation Age of Onset  . Lupus Father   . Lymphoma Father   . Hyperlipidemia Mother   . Cancer Daughter     sarcoma  . Colon cancer      Social History   Social History  . Marital Status: Married    Spouse Name: N/A  . Number of Children: 1  . Years of Education: N/A   Occupational History  . homemaker    Social History Main Topics  . Smoking status: Never Smoker   . Smokeless tobacco: Never Used  . Alcohol Use: 0.0 oz/week    0 Standard drinks or equivalent per week     Comment: Occasional  . Drug Use: No  . Sexual Activity: Not Currently   Other Topics Concern  . Not on file   Social History Narrative    Review of Systems: ROS Dr. [place] Silos reviewed and I agree  Physical Exam: Vital signs in last 24 hours: Temp:  [97.8 F (36.6 C)-98.6 F (37 C)] 98.1 F (36.7 C) (10/06 0750) Pulse  Rate:  [58-78] 58 (10/06 0750) Resp:  [14-21] 18 (10/06 0750) BP: (101-134)/(39-57) 102/47 mmHg (10/06 0750) SpO2:  [91 %-100 %] 95 % (10/06 0750) Weight:  [61.236 kg (135 lb)-61.281 kg (135 lb 1.6 oz)] 61.281 kg (135 lb 1.6 oz) (10/05 2047) Last BM Date: 10/04/15 General:   Alert,  Well-developed, well-nourished, pleasant and cooperative in NAD Head:  Normocephalic and atraumatic. Eyes:  Sclera clear, no icterus.   Conjunctiva pink. Ears:  Normal auditory acuity. Nose:  No deformity, discharge,  or lesions. Mouth:  No deformity or lesions.  Oropharynx pink & moist. Neck:   Supple; no masses or thyromegaly. Lungs:  Clear throughout to auscultation.   No wheezes, crackles, or rhonchi. No acute distress. Heart:  Regular rate and rhythm; no murmurs, clicks, rubs,  or gallops. Abdomen:  Soft, nontender and nondistended. No masses, hepatosplenomegaly or hernias noted. Normal bowel sounds, without guarding, and without rebound.     Msk:  Somewhat diffusely atrophic; deformities as described below. Normal posture. Pulses: Poor peripheral pulses Extremities:  Upper and lower extremity digital and joint deformities with also signs of chronic ischemic necrosis of digital tips; wrapping on right lower extremity; Without clubbing or edema. Neurologic:  Alert and  oriented x4; diffusely weak, but otherwise grossly normal neurologically. Skin:  Scattered telangiectasias on chest and back; sclerotic texturing of skin consistent with scleroderma; chronic distal digital ischemic necrosis changes Psych:  Alert and cooperative. Flat affect.  Depressed-appearing   Lab Results:  Recent Labs  10/05/15 1239 10/06/15 0905  WBC 6.5 5.0  HGB 6.0* 7.8*  HCT 21.1* 26.7*  PLT 353 327   BMET  Recent Labs  10/05/15 1239  NA 134*  K 3.8  CL 95*  CO2 25  GLUCOSE 109*  BUN 31*  CREATININE 1.33*  CALCIUM 9.1   LFT No results for input(s): PROT, ALBUMIN, AST, ALT, ALKPHOS, BILITOT, BILIDIR, IBILI in the last 72 hours. PT/INR No results for input(s): LABPROT, INR in the last 72 hours.  Studies/Results: Dg Chest 2 View  10/05/2015   CLINICAL DATA:  Weakness and fatigue.  Anemia.  EXAM: CHEST  2 VIEW  COMPARISON:  Chest CTs 06/15/2013  FINDINGS: The heart is enlarged but stable. There is tortuosity and calcification of the thoracic aorta. The lungs demonstrate emphysematous changes and pulmonary scarring. Patchy basilar density, left greater than right suspicious for infiltrate. The bony thorax is intact.  IMPRESSION: Stable cardiac enlargement.  Emphysematous changes and  pulmonary scarring.  Suspect bibasilar infiltrates, left greater than right.   Electronically Signed   By: Marijo Sanes M.D.   On: 10/05/2015 17:40   Impression:  1.  New onset iron-deficiency anemia.  On chronic clopidigrel and prednisone.  No overt GI bleeding. 2.  Black stools, seem related to iron intake, resolved after iron cessation. 3.  Chronic scleroderma without obvious GI tract symptoms (constipation, GERD).  Plan:  1.  Hold clopidigrel and blood thinners. 2.  Serial CBCs. 3.  Protonix. 4.  Endoscopy and colonoscopy tomorrow. 5.  Celiac serologies. 6.  Risks (bleeding, infection, bowel perforation that could require surgery, sedation-related changes in cardiopulmonary systems), benefits (identification and possible treatment of source of symptoms, exclusion of certain causes of symptoms), and alternatives (watchful waiting, radiographic imaging studies, empiric medical treatment) of upper endoscopy (EGD) were explained to patient/family in detail and patient wishes to proceed. 7.  Risks (bleeding, infection, bowel perforation that could require surgery, sedation-related changes in cardiopulmonary systems), benefits (identification and possible treatment of source  of symptoms, exclusion of certain causes of symptoms), and alternatives (watchful waiting, radiographic imaging studies, empiric medical treatment) of colonoscopy were explained to patient/family in detail and patient wishes to proceed.   LOS: 1 day   Karman Veney M  10/06/2015, 9:26 AM  Pager [phone] If no answer or after 5 PM call [phone]

## 2015-10-07 NOTE — Anesthesia Preprocedure Evaluation (Addendum)
Anesthesia Evaluation  Patient identified by MRN, date of birth, ID band Patient awake    Reviewed: Allergy & Precautions, H&P , NPO status , Patient's Chart, lab work & pertinent test results  History of Anesthesia Complications Negative for: history of anesthetic complications  Airway Mallampati: II  TM Distance: >3 FB Neck ROM: full    Dental no notable dental hx.    Pulmonary neg pulmonary ROS,    Pulmonary exam normal breath sounds clear to auscultation       Cardiovascular hypertension, + Peripheral Vascular Disease, +CHF and + DOE  negative cardio ROS Normal cardiovascular exam Rhythm:regular Rate:Normal  Patient with severe pulmonary hypertension, see Dr. Edwena Bunde note -echo with normal EF left sided   Neuro/Psych negative neurological ROS     GI/Hepatic negative GI ROS, Neg liver ROS,   Endo/Other  negative endocrine ROS  Renal/GU negative Renal ROS     Musculoskeletal   Abdominal   Peds  Hematology  (+) anemia ,   Anesthesia Other Findings   Reproductive/Obstetrics negative OB ROS                            Anesthesia Physical Anesthesia Plan  ASA: III  Anesthesia Plan: MAC   Post-op Pain Management:    Induction: Intravenous  Airway Management Planned: Natural Airway  Additional Equipment:   Intra-op Plan:   Post-operative Plan:   Informed Consent: I have reviewed the patients History and Physical, chart, labs and discussed the procedure including the risks, benefits and alternatives for the proposed anesthesia with the patient or authorized representative who has indicated his/her understanding and acceptance.   Dental Advisory Given  Plan Discussed with: Anesthesiologist, CRNA and Surgeon  Anesthesia Plan Comments:         Anesthesia Quick Evaluation

## 2015-10-07 NOTE — Transfer of Care (Signed)
Immediate Anesthesia Transfer of Care Note  Patient: [name] [name]  Procedure(s) Performed: Procedure(s): ESOPHAGOGASTRODUODENOSCOPY (EGD) WITH PROPOFOL (Left) COLONOSCOPY WITH PROPOFOL (Left)  Patient Location: PACU  Anesthesia Type:MAC  Level of Consciousness: awake, alert , sedated and patient cooperative  Airway & Oxygen Therapy: Patient Spontanous Breathing and Patient connected to nasal cannula oxygen  Post-op Assessment: Report given to RN, Post -op Vital signs reviewed and stable and Patient moving all extremities  Post vital signs: Reviewed and stable  Last Vitals:  Filed Vitals:   10/07/15 1106  BP: 160/48  Pulse: 70  Temp:   Resp: 19    Complications: No apparent anesthesia complications

## 2015-10-07 NOTE — Interval H&P Note (Signed)
History and Physical Interval Note:  10/07/2015 12:26 PM  [name] [name]  has presented today for surgery, with the diagnosis of iron-deficiency anemia  The various methods of treatment have been discussed with the patient and family. After consideration of risks, benefits and other options for treatment, the patient has consented to  Procedure(s): ESOPHAGOGASTRODUODENOSCOPY (EGD) WITH PROPOFOL (Left) COLONOSCOPY WITH PROPOFOL (Left) as a surgical intervention .  The patient's history has been reviewed, patient examined, no change in status, stable for surgery.  I have reviewed the patient's chart and labs.  Questions were answered to the patient's satisfaction.     Slate Debroux JR,Abir Eroh L

## 2015-10-07 NOTE — Progress Notes (Signed)
Pharmacy: IV Iron Follow-Up  38 YOF with history of PAH, scleroderma, non-Hodgkin's lymphoma, HTN and PAD presents with fatigue. Hgb 8.1, Fe 11, UIBC 511, TIBC 522, Sat Ratios 2, Ferritin 8, Folate 53.1 and VitB 1061. Pharmacy was consulted to dose IV iron for iron deficient anemia.   The patient tolerated both the test dose and total dose repletion of 1g of IV dextran on 10/6. The patient also received 2 units PRBC on 10/6 and Hgb is up to 9.   Pharmacy will plan to sign off since no further IV iron is needed at this time. Please re-consult [place] if needed.   Alycia Rossetti, PharmD, BCPS Clinical Pharmacist Pager: [phone] 10/07/2015 1:41 PM

## 2015-10-07 NOTE — Anesthesia Postprocedure Evaluation (Signed)
  Anesthesia Post-op Note  Patient: [name] [name]  Procedure(s) Performed: Procedure(s) (LRB): ESOPHAGOGASTRODUODENOSCOPY (EGD) WITH PROPOFOL (Left) COLONOSCOPY WITH PROPOFOL (Left)  Patient Location: PACU  Anesthesia Type: MAC  Level of Consciousness: awake and alert   Airway and Oxygen Therapy: Patient Spontanous Breathing  Post-op Pain: mild  Post-op Assessment: Post-op Vital signs reviewed, Patient's Cardiovascular Status Stable, Respiratory Function Stable, Patent Airway and No signs of Nausea or vomiting  Last Vitals:  Filed Vitals:   10/07/15 1332  BP: 115/42  Pulse: 81  Temp:   Resp: 18    Post-op Vital Signs: stable   Complications: No apparent anesthesia complications

## 2015-10-07 NOTE — Progress Notes (Signed)
[facility] PROGRESS NOTE  [name] [name] QMV:[id number] DOB: 1951-07-27 DOA: 10/05/2015 PCP: Hoyt Koch, MD  Assessment/Plan: #1 symptomatic anemia/iron deficiency anemia Questionable etiology. Patient with history of scleroderma. Patient with no overt bleeding. FOBT pending. Hemoglobin on admission was 6.0. Patient status post 2 units packed red blood cells with hemoglobin currently at 9.0. Anemia panel consistent with iron deficiency anemia with a iron level of 11 and a ferritin of 8. Patient states when taking oral iron developed diarrhea. Patient status post 1 g IV iron. Patient has been assessed by gastroenterology and patient for upper endoscopy and colonoscopy today. Continue to hold aspirin and Plavix. Serial CBCs. Continue PPI. GI has ordered celiac serologies. GI following and appreciate input and recommendations.  #2 chronic scleroderma Stable. Outpatient follow-up.  #3 hypertension Stable.  #4 pulmonary hypertension Continue home regimen of Aldactone, Demadex, slidenafil, macitentan. Aspirin and Plavix on hold. GI to advise when we may resume aspirin and Plavix.  #5 right lower extremity wounds Patient does not want wound care to assess the right lower extremity wounds prefers to follow-up at the wound care center as outpatient.  #6 hypokalemia Replete.  #7 prophylaxis PPI for GI prophylaxis. SCDs for DVT prophylaxis.  Code Status: Full Family Communication: Updated patient and husband at bedside. Disposition Plan: Home when medically stable and hemoglobin is stable and per GI.   Consultants:  Gastroenterology: Dr. Paulita Fujita 10/06/2015  Procedures:  2 units packed red blood cells 10/05/2015  Chest x-ray 10/05/2015  Antibiotics:  None  HPI/Subjective: Patient states fatigue improved. No shortness of breath. No chest pain.  Objective: Filed Vitals:   10/07/15 0433  BP: 117/49  Pulse: 67  Temp: 98.5 F (36.9 C)  Resp: 20     Intake/Output Summary (Last 24 hours) at 10/07/15 0835 Last data filed at 10/07/15 2841  Gross per 24 hour  Intake 1252.5 ml  Output   2800 ml  Net -1547.5 ml   Filed Weights   10/05/15 1218 10/05/15 2047 10/06/15 2018  Weight: 61.236 kg (135 lb) 61.281 kg (135 lb 1.6 oz) 62.3 kg (137 lb 5.6 oz)    Exam:   General:  NAD  Cardiovascular: RRR  Respiratory: CTAB  Abdomen: Soft, nontender, nondistended, positive bowel sounds.  Musculoskeletal: No clubbing cyanosis. Right lower extremity wrapped in bandage.  Data Reviewed: Basic Metabolic Panel:  Recent Labs Lab 10/05/15 1231 10/05/15 1239 10/06/15 0905 10/07/15 0447  NA  --  134* 138 138  K  --  3.8 3.3* 3.3*  CL  --  95* 102 100*  CO2  --  _0 GLUCOSE  --  109* 89 82  BUN  --  31* 18 12  CREATININE  --  1.33* 1.07* 1.14*  CALCIUM  --  9.1 8.7* 9.0  MG 2.3  --   --   --    Liver Function Tests: No results for input(s): AST, ALT, ALKPHOS, BILITOT, PROT, ALBUMIN in the last 168 hours. No results for input(s): LIPASE, AMYLASE in the last 168 hours. No results for input(s): AMMONIA in the last 168 hours. CBC:  Recent Labs Lab 10/05/15 1239 10/06/15 0905 10/06/15 1500 10/07/15 0447  WBC 6.5 5.0 6.7 6.8  HGB 6.0* 7.8* 8.1* 9.0*  HCT 21.1* 26.7* 27.1* 30.4*  MCV 79.9 80.9 81.1 81.3  PLT 353 327 316 383   Cardiac Enzymes: No results for input(s): CKTOTAL, CKMB, CKMBINDEX, TROPONINI in the last 168 hours. BNP (last 3 results)  Recent Labs  02/22/15 1230  BNP 303.8*    ProBNP (last 3 results)  Recent Labs  09/06/15 1221  PROBNP 305.0*    CBG: No results for input(s): GLUCAP in the last 168 hours.  Recent Results (from the past 240 hour(s))  MRSA PCR Screening     Status: Abnormal   Collection Time: 10/05/15  6:30 PM  Result Value Ref Range Status   MRSA by PCR POSITIVE (A) NEGATIVE Final    Comment:        The GeneXpert MRSA Assay (FDA approved for NASAL specimens only), is one  component of a comprehensive MRSA colonization surveillance program. It is not intended to diagnose MRSA infection nor to guide or monitor treatment for MRSA infections. RESULT CALLED TO, READ BACK BY AND VERIFIED WITH: Armando Reichert RN 2006 10/05/15 A BROWNING      Studies: Dg Chest 2 View  10/05/2015   CLINICAL DATA:  Weakness and fatigue.  Anemia.  EXAM: CHEST  2 VIEW  COMPARISON:  Chest CTs 06/15/2013  FINDINGS: The heart is enlarged but stable. There is tortuosity and calcification of the thoracic aorta. The lungs demonstrate emphysematous changes and pulmonary scarring. Patchy basilar density, left greater than right suspicious for infiltrate. The bony thorax is intact.  IMPRESSION: Stable cardiac enlargement.  Emphysematous changes and pulmonary scarring.  Suspect bibasilar infiltrates, left greater than right.   Electronically Signed   By: Marijo Sanes M.D.   On: 10/05/2015 17:40    Scheduled Meds: . acidophilus  1 capsule Oral q morning - 10a  . antiseptic oral rinse  7 mL Mouth Rinse BID  . atorvastatin  10 mg Oral Daily  . Chlorhexidine Gluconate Cloth  6 each Topical Q0600  . cholecalciferol  2,000 Units Oral QPM  . citalopram  20 mg Oral Daily  . cyanocobalamin  500 mcg Oral q morning - 10a  . fluticasone  2 spray Each Nare Daily  . Macitentan  10 mg Oral Daily  . multivitamin with minerals  1 tablet Oral Daily  . mupirocin ointment  1 application Nasal BID  . omega-3 acid ethyl esters  1 g Oral Daily  . pantoprazole (PROTONIX) IV  40 mg Intravenous Q12H  . potassium chloride  40 mEq Oral Once  . predniSONE  5 mg Oral Q breakfast  . sildenafil  40 mg Oral TID  . sodium chloride  3 mL Intravenous Q12H  . spironolactone  25 mg Oral Daily  . torsemide  40 mg Oral BID   Continuous Infusions: . sodium chloride Stopped (10/06/15 1803)    Principal Problem:   Symptomatic anemia Active Problems:   Peripheral arterial occlusive disease (HCC)   Scleroderma, circumscribed  or localized   Essential hypertension   Pulmonary hypertension associated with systemic disorder (HCC)   Anemia   Anemia, iron deficiency   Hypokalemia    Time spent: 35 minutes    [facility] MD [facility] Pager [phone]. If 7PM-7AM, please contact night-coverage at [email or web address], password [facility] 10/07/2015, 8:35 AM  LOS: 2 days

## 2015-10-07 NOTE — Op Note (Signed)
[facility] [place], [address]   COLONOSCOPY PROCEDURE REPORT  PATIENT: [name] [name], [name] [name]  MR#: [id number] BIRTHDATE: 11-04-1951 , 64  yrs. old GENDER: female ENDOSCOPIST: [name], MD REFERRED BY:  [facility]. PCP: Dr. Pricilla Holm PROCEDURE DATE:  10-27-2015 PROCEDURE: ASA CLASS:   class III INDICATIONS:heme positive, anemia MEDICATIONS: propofol 220 mg for EGD and colonoscopy  DESCRIPTION OF PROCEDURE:   After the risks and benefits and of the procedure were explained, informed consent was obtained.  digital exam was normal         The [employer] Adult Colon [id number]  endoscope was introduced through the anus and advanced to the cecum identified by ileocecal valve and appendiceal orifice      .  The quality of the prep was good      .  The instrument was then slowly withdrawn as the colon was fully examined. Estimated blood loss is zero unless otherwise noted in this procedure report. The scope was withdrawn in the mucosa carefully examined. In the rectum was a 1 cm sessile polyp was removed with the hot polypectomy snare and subject to the scope. No other lesions were seen throughout the entire colon.   retro flexion was normal          The scope was then withdrawn from the patient and the procedure completed.  WITHDRAWAL TIME:  COMPLICATIONS: There were no immediate complications. ENDOSCOPIC IMPRESSION: 1. Rectal polyp removed. RECOMMENDATIONS: would recommend avoiding aspirin Plavix and other NSAIDs for at least 5 days. Repeat colonoscopy depending on pathology results.  REPEAT EXAM:  cc:Dr. Pricilla Holm  _______________________________ eSignedLaurence Spates, MD October 27, 2015 1:34 PM   CPT CODES: ICD CODES:  The ICD and CPT codes recommended by this software are interpretations from the data that the clinical staff has captured with the software.  The verification of the translation of this report  to the ICD and CPT codes and modifiers is the sole responsibility of the health care institution and practicing physician where this report was generated.  [place]. will not be held responsible for the validity of the ICD and CPT codes included on this report.  AMA assumes no liability for data contained or not contained herein. CPT is a Designer, television/film set of the [employer].   PATIENT NAME:  [name], [name] MR#: [id number]

## 2015-10-08 DIAGNOSIS — K208 Unspecified mycosis: Secondary | ICD-10-CM | POA: Diagnosis present

## 2015-10-08 DIAGNOSIS — B49 Unspecified mycosis: Secondary | ICD-10-CM | POA: Diagnosis present

## 2015-10-08 LAB — BASIC METABOLIC PANEL
Anion gap: 12 (ref 5–15)
BUN: 12 mg/dL (ref 6–20)
CO2: 28 mmol/L (ref 22–32)
Calcium: 9.1 mg/dL (ref 8.9–10.3)
Chloride: 100 mmol/L — ABNORMAL LOW (ref 101–111)
Creatinine, Ser: 1.03 mg/dL — ABNORMAL HIGH (ref 0.44–1.00)
GFR calc Af Amer: >60 mL/min (ref >60)
GFR calc non Af Amer: 55 mL/min — ABNORMAL LOW (ref >60)
Glucose, Bld: 94 mg/dL (ref 65–99)
Potassium: 3.4 mmol/L — ABNORMAL LOW (ref 3.5–5.1)
Sodium: 140 mmol/L (ref 135–145)

## 2015-10-08 LAB — CBC
HCT: 26.3 % — ABNORMAL LOW (ref 36.0–46.0)
Hemoglobin: 7.7 g/dL — ABNORMAL LOW (ref 12.0–15.0)
MCH: 24.2 pg — ABNORMAL LOW (ref 26.0–34.0)
MCHC: 29.3 g/dL — ABNORMAL LOW (ref 30.0–36.0)
MCV: 82.7 fL (ref 78.0–100.0)
Platelets: 325 10*3/uL (ref 150–400)
RBC: 3.18 MIL/uL — ABNORMAL LOW (ref 3.87–5.11)
RDW: 19.2 % — ABNORMAL HIGH (ref 11.5–15.5)
WBC: 6.5 10*3/uL (ref 4.0–10.5)

## 2015-10-08 MED ORDER — POTASSIUM CHLORIDE CRYS ER 20 MEQ PO TBCR
40.0000 meq | EXTENDED_RELEASE_TABLET | Freq: Once | ORAL | Status: AC
  Administered 2015-10-08: 40 meq via ORAL
  Filled 2015-10-08: qty 2

## 2015-10-08 MED ORDER — ASPIRIN 81 MG PO TABS: 81.0000 mg | ORAL_TABLET | Freq: Every evening | ORAL | Status: DC

## 2015-10-08 MED ORDER — CLOPIDOGREL BISULFATE 75 MG PO TABS: 75.0000 mg | ORAL_TABLET | Freq: Every day | ORAL | Status: DC

## 2015-10-08 MED ORDER — POLYSACCHARIDE IRON COMPLEX 150 MG PO CAPS: 150.0000 mg | ORAL_CAPSULE | Freq: Two times a day (BID) | ORAL | Status: DC

## 2015-10-08 MED ORDER — FLUCONAZOLE 200 MG PO TABS: 200.0000 mg | ORAL_TABLET | Freq: Every day | ORAL | Status: AC

## 2015-10-08 MED ORDER — FLUCONAZOLE 200 MG PO TABS: 200.0000 mg | ORAL_TABLET | Freq: Every day | ORAL | Status: DC

## 2015-10-08 NOTE — Discharge Summary (Signed)
Physician Discharge Summary  [name] [name] VHQ:[id number] DOB: 1951-11-22 DOA: 10/05/2015  PCP: [name] Koch, MD  Admit date: 10/05/2015 Discharge date: 10/08/2015  Time spent: 65 minutes  Recommendations for Outpatient Follow-up:  1. Follow-up with [name] Koch, MD in 1 week. On follow-up patient need a CBC done to follow-up on her hemoglobin. Patient will also need a basic metabolic profile done to follow-up on electrolytes and renal function. Results of polypectomy pathology will need to be followed up upon. 2. Follow-up with [name] [name] of gastroenterology in 3 weeks. On follow-up patient need a CBC done to follow-up on her hemoglobin. Patient need to be reassessed for fungal esophagitis versus being discharged on 3 weeks of oral Diflucan. Pathology results from polypectomy done during this hospitalization will need to be followed up upon.  Discharge Diagnoses:  Principal Problem:   Symptomatic anemia Active Problems:   Fungal esophagitis; Probable per EGD 10/07/2015   Anemia, iron deficiency   Peripheral arterial occlusive disease (HCC)   Scleroderma, circumscribed or localized   Essential hypertension   Pulmonary hypertension associated with systemic disorder (HCC)   Anemia   Hypokalemia   Discharge Condition: Stable and improved  Diet recommendation: Heart healthy  Filed Weights   10/05/15 2047 10/06/15 2018 10/07/15 2132  Weight: 61.281 kg (135 lb 1.6 oz) 62.3 kg (137 lb 5.6 oz) 63.64 kg (140 lb 4.8 oz)    History of present illness:  [name] [name] is a 64 y.o. female  With history of scleroderma on chronic prednisone, history of pulmonary hypertension on aspirin and Plavix, history of non-Hodgkin's lymphoma status post treatment with chemotherapy in 2010 felt to be cured, hypertension, peripheral arterial occlusive disease who presented to the ED with anemia. Patient stated that she had blood work done about 3 weeks ago at the PCPs office and a hemoglobin  was 8.2. Patient had repeat blood work done one day prior to admission at her rheumatologist's office was noted to have a hemoglobin of 6.2. Patient was subsequently directly to the ED. Patient did report approximately a 10 day history of increased lethargy, generalized fatigue, generalized weakness, worsening shortness of breath with increased O2 requirements increase in her oxygen from 2 L to 4 L/m. Patient denied any chest pain, no fevers, no chills, no nausea, no vomiting, no hematemesis, no abdominal pain, no diarrhea, no constipation, no hematochezia. Patient stated she took some iron supplements for about a week and noted she had black stools then which resolved after she stopped taking iron supplements. Patient denied any NSAID use. Patient denied any prior history of peptic ulcer disease. Patient stated she drinks occasionally. Patient reported a EGD done approximately 10 years ago in [place]. Patient was seen in the emergency room CBC drawn on a hemoglobin of 6.0 otherwise was within normal limits. Basic metabolic profile sodium of 134 chloride of 95 BUN of 31 creatinine of 1.33 otherwise was within normal limits. Patient was typed and screened for 2 units of packed red blood cells. ED physician stated he spoke with [name] [name] of gastroenterology who will see the patient in consultation. [facility] were called to admit the patient for further evaluation and management.  Hospital Course:  #1 symptomatic anemia/iron deficiency anemia Questionable etiology. May be secondary to an occult GI blood loss. Patient with history of scleroderma. Patient with no overt bleeding. FOBT pending. Hemoglobin on admission was 6.0. Patient was admitted and transfused 2 units packed red blood cells with hemoglobin stabilizing at 7.7. Anemia panel  consistent with iron deficiency anemia with a iron level of 11 and a ferritin of 8. Patient stated when taking oral iron developed diarrhea. Patient status post 1 g IV  iron. Patient has been assessed by gastroenterology and patient subsequently underwent  upper endoscopy and colonoscopy on 10/07/2015. Upper endoscopy showed possible fungal esophagitis. Colonoscopy showed rectal polyp that was removed and sent for pathology with results pending at time of discharge. Patient was maintained on a PPI throughout the hospitalization. Patient's hemoglobin stabilized. Celiac serologies were ordered per GI. Patient will follow-up with GI as outpatient in about 3 weeks. Patient will be discharged on Nu iron. Outpatient follow-up. today.   #2 probable fungal esophagitis Per EGD 10/07/2015. Patient was started on oral Diflucan and will be discharged on a 3 week course of oral Diflucan. Patient will follow-up with GI as outpatient.  #3 chronic scleroderma Stable. Outpatient follow-up.  #4 hypertension Stable.  #5 pulmonary hypertension Continued on home regimen of Aldactone, Demadex, slidenafil, macitentan. Aspirin and Plavix were held, throughout the hospitalization and per GI may be resumed in 5-7 days.   #6 right lower extremity wounds Patient did not want wound care to assess the right lower extremity wounds, and preferred to follow-up at the wound care center as outpatient.  #7 hypokalemia Repleted.  Procedures:  2 units packed red blood cells 10/05/2015  Chest x-ray 10/05/2015  Upper endoscopy 10/07/2015 [name] [name]  Colonoscopy 10/07/2015 [name] [name]  Consultations:  Gastroenterology: [name] [name] 10/06/2015  Discharge Exam: Filed Vitals:   10/08/15 0800  BP: 115/49  Pulse: 70  Temp: 98.3 F (36.8 C)  Resp: 16    General: NAD Cardiovascular: RRR Respiratory: CTAB  Discharge Instructions   Discharge Instructions    Diet - low sodium heart healthy    Complete by:  As directed      Discharge instructions    Complete by:  As directed   NO Aspirin, Plavix or NSAIDS in 1 week. Follow up with [name] Koch, MD in 1 week. Follow up  with Dr [name] [name], Gastroenterology in 3 weeks.     Increase activity slowly    Complete by:  As directed           Current Discharge Medication List    START taking these medications   Details  fluconazole (DIFLUCAN) 200 MG tablet Take 1 tablet (200 mg total) by mouth daily. Take for 3 weeks Qty: 21 tablet, Refills: 0    iron polysaccharides (NIFEREX) 150 MG capsule Take 1 capsule (150 mg total) by mouth 2 (two) times daily. Qty: 60 capsule, Refills: 0      CONTINUE these medications which have CHANGED   Details  aspirin 81 MG tablet Take 1 tablet (81 mg total) by mouth every evening. Resume in 1 week. Qty: 30 tablet   Associated Diagnoses: Peripheral arterial occlusive disease (HCC)    clopidogrel (PLAVIX) 75 MG tablet Take 1 tablet (75 mg total) by mouth daily. Resume in 1 week. Qty: 30 tablet, Refills: 0      CONTINUE these medications which have NOT CHANGED   Details  acidophilus (RISAQUAD) CAPS Take 1 capsule by mouth every morning.     atorvastatin (LIPITOR) 10 MG tablet Take 1 tablet (10 mg total) by mouth daily. Qty: 30 tablet, Refills: 3   Associated Diagnoses: Peripheral arterial occlusive disease (HCC)    BIOTIN PO Take 1 tablet by mouth daily.    cholecalciferol (VITAMIN D) 1000 UNITS tablet Take 2,000 Units by mouth every  evening.     citalopram (CELEXA) 20 MG tablet take 1 tablet by mouth once daily Qty: 30 tablet, Refills: 3    fish oil-omega-3 fatty acids 1000 MG capsule Take 1 g by mouth daily with lunch.     gabapentin (NEURONTIN) 300 MG capsule take 1 capsule by mouth every morning and 2 every evening Qty: 90 capsule, Refills: 11    Macitentan (OPSUMIT) 10 MG TABS Take 10 mg by mouth daily. 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 Qty: 17 g, Refills: 12    moxifloxacin (VIGAMOX) 0.5 %  ophthalmic solution Place 1 drop into both eyes 3 (three) times daily as needed (allergies).     multivitamin (THERAGRAN) per tablet Take 2 tablets by mouth 2 (two) times daily.    Associated Diagnoses: Neuropathy ([place])    OXYGEN Inhale 2 L into the lungs continuous.    pantoprazole (PROTONIX) 40 MG tablet take 1 tablet by mouth once daily Qty: 30 tablet, Refills: 6    predniSONE (DELTASONE) 5 MG tablet take 1 tablet by mouth once daily WITH BREAKFAST Qty: 30 tablet, Refills: 5    sildenafil (REVATIO) 20 MG tablet Take 2 tablets (40 mg total) by mouth 3 (three) times daily. Qty: 270 tablet, Refills: 6    spironolactone (ALDACTONE) 25 MG tablet Take 1 tablet (25 mg total) by mouth daily. Qty: 30 tablet, Refills: 3    torsemide (DEMADEX) 20 MG tablet Take 2 tablets (40 mg total) by mouth 2 (two) times daily. Qty: 120 tablet, Refills: 3    vitamin B-12 (CYANOCOBALAMIN) 500 MCG tablet Take 500 mcg by mouth every morning.       STOP taking these medications     PredniSONE 2 MG TBEC        Allergies  Allergen Reactions  . Sulfa Antibiotics     Severe rash with significant peeling of face. No airway involvement per patient  . Levaquin [Levofloxacin In D5w] Other (See Comments)    Other reaction(s): Other (See Comments) FLU LIKE SYMPTOM  . Penicillins Rash    Has patient had a PCN reaction causing immediate rash, facial/tongue/throat swelling, SOB or lightheadedness with hypotension: Yes Has patient had a PCN reaction causing severe rash involving mucus membranes or skin necrosis:  Has patient had a PCN reaction that required hospitalization  Has patient had a PCN reaction occurring within the last 10 years: Yes If all of the above answers are "NO", then may proceed with Cephalosporin use.     Follow-up Information    Follow up with [name] Koch, MD. Schedule an appointment as soon as possible for a visit in 1 week.   Specialty:  Internal Medicine   Contact  information:   [place] [id number] [phone]       Follow up with EDWARDS JR,JAMES L, MD. Schedule an appointment as soon as possible for a visit in 3 weeks.   Specialty:  Gastroenterology   Why:  f/u in 3-4 weeks   Contact information:   [address] [place] [address] [phone]        The results of significant diagnostics from this hospitalization (including imaging, microbiology, ancillary and laboratory) are listed below for reference.    Significant Diagnostic Studies: Dg Chest 2 View  10/05/2015   CLINICAL DATA:  Weakness and  fatigue.  Anemia.  EXAM: CHEST  2 VIEW  COMPARISON:  Chest CTs 06/15/2013  FINDINGS: The heart is enlarged but stable. There is tortuosity and calcification of the thoracic aorta. The lungs demonstrate emphysematous changes and pulmonary scarring. Patchy basilar density, left greater than right suspicious for infiltrate. The bony thorax is intact.  IMPRESSION: Stable cardiac enlargement.  Emphysematous changes and pulmonary scarring.  Suspect bibasilar infiltrates, left greater than right.   Electronically Signed   By: Marijo Sanes M.D.   On: 10/05/2015 17:40    Microbiology: Recent Results (from the past 240 hour(s))  MRSA PCR Screening     Status: Abnormal   Collection Time: 10/05/15  6:30 PM  Result Value Ref Range Status   MRSA by PCR POSITIVE (A) NEGATIVE Final    Comment:        The GeneXpert MRSA Assay (FDA approved for NASAL specimens only), is one component of a comprehensive MRSA colonization surveillance program. It is not intended to diagnose MRSA infection nor to guide or monitor treatment for MRSA infections. RESULT CALLED TO, READ BACK BY AND VERIFIED WITH: T ISAACS RN 2006 10/05/15 A BROWNING      Labs: Basic Metabolic Panel:  Recent Labs Lab 10/05/15 1231 10/05/15 1239 10/06/15 0905 10/07/15 0447 10/08/15 0541  NA  --  134* 138 138 140  K  --  3.8 3.3* 3.3* 3.4*  CL  --   95* 102 100* 100*  CO2  --  _0 GLUCOSE  --  109* 89 82 94  BUN  --  31* _1 CREATININE  --  1.33* 1.07* 1.14* 1.03*  CALCIUM  --  9.1 8.7* 9.0 9.1  MG 2.3  --   --   --   --    Liver Function Tests: No results for input(s): AST, ALT, ALKPHOS, BILITOT, PROT, ALBUMIN in the last 168 hours. No results for input(s): LIPASE, AMYLASE in the last 168 hours. No results for input(s): AMMONIA in the last 168 hours. CBC:  Recent Labs Lab 10/06/15 0905 10/06/15 1500 10/07/15 0447 10/07/15 1510 10/08/15 0541  WBC 5.0 6.7 6.8 5.6 6.5  HGB 7.8* 8.1* 9.0* 7.7* 7.7*  HCT 26.7* 27.1* 30.4* 26.5* 26.3*  MCV 80.9 81.1 81.3 82.3 82.7  PLT 327 316 383 311 325   Cardiac Enzymes: No results for input(s): CKTOTAL, CKMB, CKMBINDEX, TROPONINI in the last 168 hours. BNP: BNP (last 3 results)  Recent Labs  02/22/15 1230  BNP 303.8*    ProBNP (last 3 results)  Recent Labs  09/06/15 1221  PROBNP 305.0*    CBG: No results for input(s): GLUCAP in the last 168 hours.     SignedIrine Seal MD [facility] 10/08/2015, 11:31 AM

## 2015-10-08 NOTE — Progress Notes (Signed)
Discharge instructions and medications discussed with patient and spouse.  Home medication returned to patient.  Prescriptions given to patient.  All questions answered.

## 2015-10-10 ENCOUNTER — Encounter (HOSPITAL_COMMUNITY): Payer: Self-pay | Admitting: Gastroenterology

## 2015-10-11 ENCOUNTER — Encounter (HOSPITAL_BASED_OUTPATIENT_CLINIC_OR_DEPARTMENT_OTHER): Admission: RE | Payer: Self-pay | Source: Ambulatory Visit

## 2015-10-11 ENCOUNTER — Ambulatory Visit (HOSPITAL_BASED_OUTPATIENT_CLINIC_OR_DEPARTMENT_OTHER)
Admission: RE | Admit: 2015-10-11 | Payer: BLUE CROSS/BLUE SHIELD | Source: Ambulatory Visit | Admitting: Orthopedic Surgery

## 2015-10-11 SURGERY — OPEN REDUCTION INTERNAL FIXATION (ORIF) ULNAR FRACTURE
Anesthesia: Choice | Laterality: Right

## 2015-10-13 ENCOUNTER — Encounter: Payer: Self-pay | Admitting: Internal Medicine

## 2015-10-13 DIAGNOSIS — L97811 Non-pressure chronic ulcer of other part of right lower leg limited to breakdown of skin: Secondary | ICD-10-CM | POA: Diagnosis not present

## 2015-10-13 DIAGNOSIS — I7300 Raynaud's syndrome without gangrene: Secondary | ICD-10-CM | POA: Diagnosis not present

## 2015-10-13 DIAGNOSIS — I872 Venous insufficiency (chronic) (peripheral): Secondary | ICD-10-CM | POA: Diagnosis not present

## 2015-10-13 DIAGNOSIS — I272 Other secondary pulmonary hypertension: Secondary | ICD-10-CM | POA: Diagnosis not present

## 2015-10-13 DIAGNOSIS — M349 Systemic sclerosis, unspecified: Secondary | ICD-10-CM | POA: Diagnosis not present

## 2015-10-17 ENCOUNTER — Encounter: Payer: Self-pay | Admitting: Internal Medicine

## 2015-10-17 ENCOUNTER — Other Ambulatory Visit (INDEPENDENT_AMBULATORY_CARE_PROVIDER_SITE_OTHER): Payer: BLUE CROSS/BLUE SHIELD

## 2015-10-17 ENCOUNTER — Ambulatory Visit (INDEPENDENT_AMBULATORY_CARE_PROVIDER_SITE_OTHER): Payer: BLUE CROSS/BLUE SHIELD | Admitting: Internal Medicine

## 2015-10-17 VITALS — BP 128/60 | HR 76 | Temp 97.9°F | Resp 18 | Ht 68.0 in | Wt 134.8 lb

## 2015-10-17 DIAGNOSIS — D62 Acute posthemorrhagic anemia: Secondary | ICD-10-CM | POA: Diagnosis not present

## 2015-10-17 DIAGNOSIS — K208 Other esophagitis without bleeding: Secondary | ICD-10-CM

## 2015-10-17 DIAGNOSIS — I10 Essential (primary) hypertension: Secondary | ICD-10-CM

## 2015-10-17 DIAGNOSIS — E876 Hypokalemia: Secondary | ICD-10-CM

## 2015-10-17 DIAGNOSIS — D509 Iron deficiency anemia, unspecified: Secondary | ICD-10-CM

## 2015-10-17 DIAGNOSIS — I779 Disorder of arteries and arterioles, unspecified: Secondary | ICD-10-CM

## 2015-10-17 LAB — CBC
HCT: 34.3 % — ABNORMAL LOW (ref 36.0–46.0)
Hemoglobin: 10.8 g/dL — ABNORMAL LOW (ref 12.0–15.0)
MCHC: 31.4 g/dL (ref 30.0–36.0)
MCV: 82.6 fl (ref 78.0–100.0)
Platelets: 331 10*3/uL (ref 150.0–400.0)
RBC: 4.16 Mil/uL (ref 3.87–5.11)
RDW: 26.8 % — ABNORMAL HIGH (ref 11.5–15.5)
WBC: 9.4 10*3/uL (ref 4.0–10.5)

## 2015-10-17 LAB — BASIC METABOLIC PANEL
BUN: 52 mg/dL — ABNORMAL HIGH (ref 6–23)
CO2: 30 mEq/L (ref 19–32)
Calcium: 10 mg/dL (ref 8.4–10.5)
Chloride: 90 mEq/L — ABNORMAL LOW (ref 96–112)
Creatinine, Ser: 1.69 mg/dL — ABNORMAL HIGH (ref 0.40–1.20)
GFR: 32.17 mL/min — ABNORMAL LOW (ref >60.00)
Glucose, Bld: 110 mg/dL — ABNORMAL HIGH (ref 70–99)
Potassium: 4.9 mEq/L (ref 3.5–5.1)
Sodium: 131 mEq/L — ABNORMAL LOW (ref 135–145)

## 2015-10-17 MED ORDER — ATORVASTATIN CALCIUM 10 MG PO TABS: 10.0000 mg | ORAL_TABLET | Freq: Every day | ORAL | Status: DC

## 2015-10-17 MED ORDER — SPIRONOLACTONE 25 MG PO TABS: 25.0000 mg | ORAL_TABLET | Freq: Every day | ORAL | Status: DC

## 2015-10-17 MED ORDER — PANTOPRAZOLE SODIUM 40 MG PO TBEC: 40.0000 mg | DELAYED_RELEASE_TABLET | Freq: Every day | ORAL | Status: DC

## 2015-10-17 MED ORDER — GABAPENTIN 300 MG PO CAPS: ORAL_CAPSULE | ORAL | Status: DC

## 2015-10-17 MED ORDER — CITALOPRAM HYDROBROMIDE 20 MG PO TABS: 20.0000 mg | ORAL_TABLET | Freq: Every day | ORAL | Status: DC

## 2015-10-17 MED ORDER — POTASSIUM CHLORIDE ER 10 MEQ PO TBCR: 10.0000 meq | EXTENDED_RELEASE_TABLET | Freq: Every day | ORAL | Status: DC

## 2015-10-17 NOTE — Progress Notes (Signed)
Pre visit review using our clinic review tool, if applicable. No additional management support is needed unless otherwise documented below in the visit note.

## 2015-10-17 NOTE — Patient Instructions (Signed)
We will check the blood work today and call you back with the results.   You can also go to [email or web address] to log into your account. If you need help with mychart call [phone]).  Keep taking the fluconazole and you can use imodium or kaopectate for the diarrhea. I would say to take imodium before breakfast and if you are still having diarrhea take 1 pill before lunch as well. You can take imodium until you get blocked up as constipation is the only worrisome side effect. If you get constipated take less of the imodium.   Food Choices to Help Relieve Diarrhea, Adult When you have diarrhea, the foods you eat and your eating habits are very important. Choosing the right foods and drinks can help relieve diarrhea. Also, because diarrhea can last up to 7 days, you need to replace lost fluids and electrolytes (such as sodium, potassium, and chloride) in order to help prevent dehydration.  WHAT GENERAL GUIDELINES DO I NEED TO FOLLOW?  Slowly drink 1 cup (8 oz) of fluid for each episode of diarrhea. If you are getting enough fluid, your urine will be clear or pale yellow.  Eat starchy foods. Some good choices include white rice, white toast, pasta, low-fiber cereal, baked potatoes (without the skin), saltine crackers, and bagels.  Avoid large servings of any cooked vegetables.  Limit fruit to two servings per day. A serving is  cup or 1 small piece.  Choose foods with less than 2 g of fiber per serving.  Limit fats to less than 8 tsp (38 g) per day.  Avoid fried foods.  Eat foods that have probiotics in them. Probiotics can be found in certain dairy products.  Avoid foods and beverages that may increase the speed at which food moves through the stomach and intestines (gastrointestinal tract). Things to avoid include:  High-fiber foods, such as dried fruit, raw fruits and vegetables, nuts, seeds, and whole grain foods.  Spicy foods and high-fat foods.  Foods and  beverages sweetened with high-fructose corn syrup, honey, or sugar alcohols such as xylitol, sorbitol, and mannitol. WHAT FOODS ARE RECOMMENDED? Grains White rice. White, [place], or pita breads (fresh or toasted), including plain rolls, buns, or bagels. White pasta. Saltine, soda, or graham crackers. Pretzels. Low-fiber cereal. Cooked cereals made with water (such as cornmeal, farina, or cream cereals). Plain muffins. Matzo. Melba toast. Zwieback.  Vegetables Potatoes (without the skin). Strained tomato and vegetable juices. Most well-cooked and canned vegetables without seeds. Tender lettuce. Fruits Cooked or canned applesauce, apricots, cherries, fruit cocktail, grapefruit, peaches, pears, or plums. Fresh bananas, apples without skin, cherries, grapes, cantaloupe, grapefruit, peaches, oranges, or plums.  Meat and Other Protein Products Baked or boiled chicken. Eggs. Tofu. Fish. Seafood. Smooth peanut butter. Ground or well-cooked tender beef, ham, veal, lamb, pork, or poultry.  Dairy Plain yogurt, kefir, and unsweetened liquid yogurt. Lactose-free milk, buttermilk, or soy milk. Plain hard cheese. Beverages Sport drinks. Clear broths. Diluted fruit juices (except prune). Regular, caffeine-free sodas such as ginger ale. Water. Decaffeinated teas. Oral rehydration solutions. Sugar-free beverages not sweetened with sugar alcohols. Other Bouillon, broth, or soups made from recommended foods.  The items listed above may not be a complete list of recommended foods or beverages. Contact your dietitian for more options. WHAT FOODS ARE NOT RECOMMENDED? Grains Whole grain, whole wheat, bran, or rye breads, rolls, pastas, crackers, and cereals. Wild or brown rice. Cereals that contain more than 2 g of fiber per serving. Corn tortillas or  taco shells. Cooked or dry oatmeal. Granola. Popcorn. Vegetables Raw vegetables. Cabbage, broccoli, Brussels sprouts, artichokes, baked beans, beet greens, corn, kale,  legumes, peas, sweet potatoes, and yams. Potato skins. Cooked spinach and cabbage. Fruits Dried fruit, including raisins and dates. Raw fruits. Stewed or dried prunes. Fresh apples with skin, apricots, mangoes, pears, raspberries, and strawberries.  Meat and Other Protein Products Chunky peanut butter. Nuts and seeds. Beans and lentils. Berniece Salines.  Dairy High-fat cheeses. Milk, chocolate milk, and beverages made with milk, such as milk shakes. Cream. Ice cream. Sweets and Desserts Sweet rolls, doughnuts, and sweet breads. Pancakes and waffles. Fats and Oils Butter. Cream sauces. Margarine. Salad oils. Plain salad dressings. Olives. Avocados.  Beverages Caffeinated beverages (such as coffee, tea, soda, or energy drinks). Alcoholic beverages. Fruit juices with pulp. Prune juice. Soft drinks sweetened with high-fructose corn syrup or sugar alcohols. Other Coconut. Hot sauce. Chili powder. Mayonnaise. Gravy. Cream-based or milk-based soups.  The items listed above may not be a complete list of foods and beverages to avoid. Contact your dietitian for more information. WHAT SHOULD I DO IF I BECOME DEHYDRATED? Diarrhea can sometimes lead to dehydration. Signs of dehydration include dark urine and dry mouth and skin. If you think you are dehydrated, you should rehydrate with an oral rehydration solution. These solutions can be purchased at pharmacies, retail stores, or online.  Drink -1 cup (120-240 mL) of oral rehydration solution each time you have an episode of diarrhea. If drinking this amount makes your diarrhea worse, try drinking smaller amounts more often. For example, drink 1-3 tsp (5-15 mL) every 5-10 minutes.  A general rule for staying hydrated is to drink 1-2 L of fluid per day. Talk to your health care Bryndan Bilyk about the specific amount you should be drinking each day. Drink enough fluids to keep your urine clear or pale yellow.   This information is not intended to replace advice given to  you by your health care Sherrita Riederer. Make sure you discuss any questions you have with your health care Kirti Carl.   Document Released: 03/08/2004 Document Revised: 01/07/2015 Document Reviewed: 11/09/2013 Elsevier Interactive Patient Education [employer].

## 2015-10-18 ENCOUNTER — Inpatient Hospital Stay (HOSPITAL_COMMUNITY): Admission: RE | Admit: 2015-10-18 | Payer: BLUE CROSS/BLUE SHIELD | Source: Ambulatory Visit

## 2015-10-20 DIAGNOSIS — L97811 Non-pressure chronic ulcer of other part of right lower leg limited to breakdown of skin: Secondary | ICD-10-CM | POA: Diagnosis not present

## 2015-10-20 DIAGNOSIS — M349 Systemic sclerosis, unspecified: Secondary | ICD-10-CM | POA: Diagnosis not present

## 2015-10-20 DIAGNOSIS — I872 Venous insufficiency (chronic) (peripheral): Secondary | ICD-10-CM | POA: Diagnosis not present

## 2015-10-20 DIAGNOSIS — I272 Other secondary pulmonary hypertension: Secondary | ICD-10-CM | POA: Diagnosis not present

## 2015-10-20 NOTE — Assessment & Plan Note (Signed)
Replaced in the hospital and normal prior to discharge.

## 2015-10-20 NOTE — Assessment & Plan Note (Signed)
BP at goal and not low today.

## 2015-10-20 NOTE — Assessment & Plan Note (Signed)
Taking iron pills and B12 daily. Checking CBC today and adjust as needed.

## 2015-10-20 NOTE — Progress Notes (Signed)
   Subjective:    [name], female    DOB: 1951/09/25, 64 y.o.   MRN: [id number]  HPI The patient is a 64 YO female coming in for hospital follow up (was in for anemia and found to have some GI bleeding, EGD and colonoscopy done and treated, taking PPI). She is still taking her PPI and has not noticed any dark stools or blood. Also feeling less fatigue and less SOB. 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 She gets 3-4 bouts after eating every time. Has had some accidents but able to leave the house in between. Appetite is good and she is drinking plenty of fluids.   Review of Systems  Constitutional: Negative for fever, chills, activity change and fatigue.  HENT: Negative for ear discharge, ear pain, sinus pressure, sore throat and trouble swallowing.   Respiratory: Negative for cough, chest tightness, shortness of breath and wheezing.   Cardiovascular: Negative for chest pain, palpitations and leg swelling.  Gastrointestinal: Positive for diarrhea. Negative for nausea, vomiting, abdominal pain, constipation and abdominal distention.  Skin: Positive for wound.       healing  Neurological: Negative.       Objective:   Physical Exam  Constitutional: She appears well-developed and well-nourished. No distress.  HENT:  Head: Normocephalic and atraumatic.  Eyes: EOM are normal.  Neck: Normal range of motion.  Cardiovascular: Normal rate and regular rhythm.   Pulmonary/Chest: Effort normal and breath sounds normal. No respiratory distress. She has no wheezes. She has no rales.  Abdominal: Soft. Bowel sounds are normal. She exhibits no distension. There is no tenderness. There is no rebound.  Skin: Skin is warm.  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    Filed Vitals:   10/17/15 1547  BP: 128/60  Pulse: 76  Temp: 97.9 F (36.6 C)  TempSrc: Oral  Resp: 18  Height: _0  (1.727 m)  Weight: 134 lb  12.8 oz (61.145 kg)      Assessment & Plan:

## 2015-10-20 NOTE — Assessment & Plan Note (Signed)
Talked to her about continuing the fluconazole for now and using imodium for the diarrhea. She only has 1 week left of therapy.

## 2015-10-21 ENCOUNTER — Other Ambulatory Visit: Payer: Self-pay

## 2015-10-21 DIAGNOSIS — I272 Pulmonary hypertension, unspecified: Secondary | ICD-10-CM

## 2015-11-01 ENCOUNTER — Encounter: Payer: Self-pay | Admitting: Emergency Medicine

## 2015-11-01 ENCOUNTER — Ambulatory Visit (INDEPENDENT_AMBULATORY_CARE_PROVIDER_SITE_OTHER): Payer: BLUE CROSS/BLUE SHIELD | Admitting: Emergency Medicine

## 2015-11-01 VITALS — BP 130/52 | HR 86 | Ht 68.0 in | Wt 137.0 lb

## 2015-11-01 DIAGNOSIS — L940 Localized scleroderma [morphea]: Secondary | ICD-10-CM | POA: Diagnosis not present

## 2015-11-01 DIAGNOSIS — I2729 Other secondary pulmonary hypertension: Secondary | ICD-10-CM

## 2015-11-01 DIAGNOSIS — I272 Other secondary pulmonary hypertension: Secondary | ICD-10-CM

## 2015-11-01 NOTE — Assessment & Plan Note (Signed)
Please continue your macetentan and sildenifil as you are taking them  Continue your oxygen at 2L/min.  Continue prednisone 79m daily.  Follow with Dr BAmil Amenas planned.  Echocardiogram next July 2017 Follow with Dr BLamonte Sakaiin 6 months or sooner if you have any problems

## 2015-11-01 NOTE — Progress Notes (Signed)
Subjective:    [name], female    DOB: Jan 12, 1951, 64 y.o.   MRN: [id number] HPI 64 yo woman, never smoker, hx of scleroderma, Raynaud's, non-Hodgkins lymphoma (treated '10), HTN, PAH originally dx in [place] and treated, but meds d/c'd when she improved. Has been seen in by Dr [name], had R heart cath as below in 03/2012. She has been restarted on therapy, seen both in [place] and at [facility] >> Tracleer + Revatio + Veletri (since 2/'14). She has felt worse on IV therapy, progressive as it titrated up.  Dr Gilles Chiquito scaled back her veletri for this reason. She has had progressive dyspnea - was just started prednisone 78m qd. She has benefited significantly, better exertional tolerance and SpO2.    Underwent RHC on 04/18/12 as below:  RA = 9  RV = 75/11/14  PA = 71/25 (44)  PCW = 4  Fick cardiac output/index = 3.0/1.7  Thermo CO/CI = 2.7/1.5  PVR = 23.5 [place]  FA sat = 79% on room air (checked 3 times). Sat with finger probe on air 94%  PA sat = 41%, 43%  MW 04/18/12 880 feet  ROV 06/24/13 -- f/u for scleroderma, Raynaud's, non-Hodgkins lymphoma (treated '10), HTN, PAH on IV therapy. 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 Aspirating).  She states that she has felt much better with good functional capacity, less dyspnea, no CP since she started prednisone. No overt aspiration sx although she is high risk. [facility]   ROV 07/24/13 -- scleroderma, Raynaud's, non-Hodgkins lymphoma (treated '10), HTN, PAH on IV therapy. Last time we initiated a pred taper, currently on 254m She has noticed a bit of lightheadedness. She saw Dr FoGilles Chiquitoarly July - her O2 was decreased to 4L/min w exertion. Her former maintenance pred dose was 45m1md.   ROV 08/27/13 -- scleroderma, Raynaud's, non-Hodgkins lymphoma (treated '10), HTN, PAH on IV therapy. We have been tapering pred, currently at 15m80md. She has been interested in getting off Veletri. We have planned to repeat R heart cath when we get to stable pred dosing. Breathing is stable. Continues to have occasional Reynaud's, not really increased freq.   ROV 10/08/13 -- scleroderma, Raynaud's, non-Hodgkins lymphoma (treated '10), HTN, PAH on IV therapy. We have been tapering pred, last time decreased to 45mg.29me appears to have tolerated this. No new issues with breathing, CP. Edema is stable. No syncope or pre-syncope.   ROV 01/06/14 -- scleroderma, Raynaud's, non-Hodgkins lymphoma (treated '10), HTN, PAH previously on IV therapy >> [facility]. Current dose is 6 puffs qid. Also on bosentan and sildenifil. She has also been treated for a MRSA R leg ulcer with linezolid, planned for 10 days. She had to stop celexa when the linezolid caused QTc prolongation. She is on Pred 45mg, 61mble scleroderma dose. She saw Dr BeekmaAmil Amenlanning to follow with him.   Breathing is doing well. Doesn't feel that she lost much ground since she changed from IV therapy. No real edema. No syncope or presyncope. No HA's or visual changes.   ROV 01/04/15 -- follow up visit for scleroderma, secondary PAH. We had changed her Veletri to Tyvaso. She has since been taken off Tyvaso. She hasn't seemed to lose any ground off this. Remains on bosentan and tracleer. Also on chronic  prednisone 70m. She wonders about starting cellcept and getting off the prednisone. She also wonders about a substitute for bosentan.   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 She has been on bosentan and sildenafil; [facility] any real change. We dropped her pred from 5 to 228mand she has noted some severe fatigue 2h before its time to take the next dose. We did the decrease because she has a poorly healing wound.  She does  not desaturate.        ROV 11/01/15 -- Follow-up visit for history of autoimmune disease and associated secondary pulmonary hypertension. She is on macetentan, slidenifil. She is on prednisone 80m69mShe was admited recently for symptomatic anemia, required transfusion. EGD and CSY were reassuring, received PRBC and staretd on Fe. She feels better, stronger. TTE per CHF clinic plans.   Review of Systems  Constitutional: Negative for fever, appetite change and unexpected weight change.  HENT: Negative for congestion, dental problem, ear pain, nosebleeds, postnasal drip, rhinorrhea, sinus pressure, sneezing, sore throat and trouble swallowing.   Eyes: Negative for redness and itching.  Respiratory: Positive for shortness of breath. Negative for cough, chest tightness and wheezing.   Cardiovascular: Negative for palpitations and leg swelling.  Gastrointestinal: Negative for nausea and vomiting.  Genitourinary: Negative for dysuria.  Musculoskeletal: Negative for joint swelling.  Skin: Negative for rash.  Neurological: Negative for headaches.  Hematological: Does not bruise/bleed easily.  Psychiatric/Behavioral: Negative for dysphoric mood. The patient is not nervous/anxious.        Objective:   Physical Exam Filed Vitals:   11/01/15 1536 11/01/15 1538  BP:  130/52  Pulse:  86  Height: 5' 8" (1.727 m)   Weight: 137 lb (62.143 kg)   SpO2:  96%   Gen: Pleasant, thin, in no distress,  normal affect on high flow O2  ENT: No lesions,  mouth clear,  oropharynx clear, no postnasal drip  Neck: No JVD, no TMG, no carotid bruits  Lungs: No use of accessory muscles, few bibasilar insp crackles  Cardiovascular: RRR, heart sounds normal, no murmur or gallops, no peripheral edema  Musculoskeletal: No deformities, no cyanosis or clubbing  Neuro: alert, non focal  Skin: significant changes from scleroderma on arms, hands, fingers      Assessment & Plan:  Scleroderma, circumscribed or  localized Prednisone 5 mg daily  Pulmonary hypertension associated with systemic disorder (HCCWest Warehamlease continue your macetentan and sildenifil as you are taking them  Continue your oxygen at 2L/min.  Continue prednisone 80mg71mily.  Follow with Dr BeekAmil Amenplanned.  Echocardiogram next July 2017 Follow with Dr ByruLamonte Sakai6 months or sooner if you have any problems

## 2015-11-01 NOTE — Patient Instructions (Addendum)
Please continue your macetentan and sildenifil as you are taking them  Continue your oxygen at 2L/min.  Continue prednisone 10m daily.  Follow with Dr BAmil Amenas planned.  Echocardiogram next July 2017 Follow with Dr BLamonte Sakaiin 6 months or sooner if you have any problems

## 2015-11-01 NOTE — Assessment & Plan Note (Signed)
Prednisone 5 mg daily

## 2015-11-03 ENCOUNTER — Other Ambulatory Visit: Payer: Self-pay | Admitting: Geriatric Medicine

## 2015-11-03 ENCOUNTER — Telehealth: Payer: Self-pay | Admitting: Internal Medicine

## 2015-11-03 ENCOUNTER — Other Ambulatory Visit: Payer: Self-pay | Admitting: Internal Medicine

## 2015-11-03 ENCOUNTER — Encounter (HOSPITAL_BASED_OUTPATIENT_CLINIC_OR_DEPARTMENT_OTHER): Payer: BLUE CROSS/BLUE SHIELD | Attending: Internal Medicine

## 2015-11-03 DIAGNOSIS — L97821 Non-pressure chronic ulcer of other part of left lower leg limited to breakdown of skin: Secondary | ICD-10-CM | POA: Insufficient documentation

## 2015-11-03 DIAGNOSIS — M349 Systemic sclerosis, unspecified: Secondary | ICD-10-CM | POA: Diagnosis not present

## 2015-11-03 DIAGNOSIS — I83222 Varicose veins of left lower extremity with both ulcer of calf and inflammation: Secondary | ICD-10-CM | POA: Diagnosis not present

## 2015-11-03 DIAGNOSIS — I272 Other secondary pulmonary hypertension: Secondary | ICD-10-CM | POA: Insufficient documentation

## 2015-11-03 MED ORDER — POLYSACCHARIDE IRON COMPLEX 150 MG PO CAPS: 150.0000 mg | ORAL_CAPSULE | Freq: Two times a day (BID) | ORAL | Status: DC

## 2015-11-03 NOTE — Telephone Encounter (Signed)
Pt was prescribed iron polysaccharides (NIFEREX) 150 MG capsule [[id number]] while in the hospital and she is going to need a refill Pharmacy is [facility] on [address]

## 2015-11-03 NOTE — Telephone Encounter (Signed)
Should patient be taking this medication because it is not on her med list. Please advise, thanks.

## 2015-11-03 NOTE — Telephone Encounter (Signed)
Refilled today and would recommend to keep taking for 2 more months.

## 2015-11-03 NOTE — Telephone Encounter (Signed)
Needs hospital follow up visit first.

## 2015-11-03 NOTE — Telephone Encounter (Signed)
She already had a hospital f/u appt on 10/17

## 2015-11-09 ENCOUNTER — Ambulatory Visit (HOSPITAL_COMMUNITY): Admission: RE | Admit: 2015-11-09 | Payer: BLUE CROSS/BLUE SHIELD | Source: Ambulatory Visit

## 2015-11-10 ENCOUNTER — Other Ambulatory Visit (HOSPITAL_COMMUNITY): Payer: BLUE CROSS/BLUE SHIELD

## 2015-11-10 ENCOUNTER — Ambulatory Visit (HOSPITAL_COMMUNITY)
Admission: RE | Admit: 2015-11-10 | Discharge: 2015-11-10 | Disposition: A | Discharge status: Home / Self Care | Payer: BLUE CROSS/BLUE SHIELD | Source: Ambulatory Visit | Attending: Internal Medicine | Admitting: Internal Medicine

## 2015-11-10 DIAGNOSIS — C8590 Non-Hodgkin lymphoma, unspecified, unspecified site: Secondary | ICD-10-CM | POA: Diagnosis not present

## 2015-11-10 MED ORDER — GADOBENATE DIMEGLUMINE 529 MG/ML IV SOLN
10.0000 mL | Freq: Once | INTRAVENOUS | Status: AC | PRN
  Administered 2015-11-10: 7 mL via INTRAVENOUS

## 2015-11-17 ENCOUNTER — Other Ambulatory Visit (INDEPENDENT_AMBULATORY_CARE_PROVIDER_SITE_OTHER): Payer: BLUE CROSS/BLUE SHIELD

## 2015-11-17 DIAGNOSIS — I83222 Varicose veins of left lower extremity with both ulcer of calf and inflammation: Secondary | ICD-10-CM | POA: Diagnosis not present

## 2015-11-17 DIAGNOSIS — I270 Primary pulmonary hypertension: Secondary | ICD-10-CM

## 2015-11-17 DIAGNOSIS — L97821 Non-pressure chronic ulcer of other part of left lower leg limited to breakdown of skin: Secondary | ICD-10-CM | POA: Diagnosis not present

## 2015-11-17 DIAGNOSIS — M349 Systemic sclerosis, unspecified: Secondary | ICD-10-CM | POA: Diagnosis not present

## 2015-11-17 DIAGNOSIS — I272 Other secondary pulmonary hypertension: Secondary | ICD-10-CM | POA: Diagnosis not present

## 2015-11-17 LAB — HEPATIC FUNCTION PANEL
ALT: 23 U/L (ref 0–35)
AST: 27 U/L (ref 0–37)
Albumin: 4.5 g/dL (ref 3.5–5.2)
Alkaline Phosphatase: 99 U/L (ref 39–117)
Bilirubin, Direct: 0.1 mg/dL (ref 0.0–0.3)
Total Bilirubin: 0.5 mg/dL (ref 0.2–1.2)
Total Protein: 8 g/dL (ref 6.0–8.3)

## 2015-11-17 LAB — BASIC METABOLIC PANEL
BUN: 41 mg/dL — ABNORMAL HIGH (ref 6–23)
CO2: 29 mEq/L (ref 19–32)
Calcium: 9.7 mg/dL (ref 8.4–10.5)
Chloride: 95 mEq/L — ABNORMAL LOW (ref 96–112)
Creatinine, Ser: 1.31 mg/dL — ABNORMAL HIGH (ref 0.40–1.20)
GFR: 43.15 mL/min — ABNORMAL LOW (ref >60.00)
Glucose, Bld: 103 mg/dL — ABNORMAL HIGH (ref 70–99)
Potassium: 3.7 mEq/L (ref 3.5–5.1)
Sodium: 138 mEq/L (ref 135–145)

## 2015-11-21 ENCOUNTER — Telehealth: Payer: Self-pay | Admitting: Internal Medicine

## 2015-11-21 DIAGNOSIS — D62 Acute posthemorrhagic anemia: Secondary | ICD-10-CM

## 2015-11-21 NOTE — Telephone Encounter (Signed)
Patient called and would like to have her hemoglobin checked. She would like an order to be put in for this. Please advise

## 2015-11-21 NOTE — Telephone Encounter (Signed)
Patient states that she was supposed to have her hemoglobin checked. She wants to know if we will enter an order AND if the blood from her 11/17/2015 visit is kept so they could use that OR if she needs to go back down. Please advise her.

## 2015-11-21 NOTE — Telephone Encounter (Signed)
Order placed, she can come to the lab anytime.

## 2015-11-22 ENCOUNTER — Other Ambulatory Visit: Payer: Self-pay | Admitting: Internal Medicine

## 2015-11-29 ENCOUNTER — Other Ambulatory Visit (INDEPENDENT_AMBULATORY_CARE_PROVIDER_SITE_OTHER): Payer: BLUE CROSS/BLUE SHIELD

## 2015-11-29 DIAGNOSIS — D62 Acute posthemorrhagic anemia: Secondary | ICD-10-CM | POA: Diagnosis not present

## 2015-11-29 LAB — CBC
HCT: 34.5 % — ABNORMAL LOW (ref 36.0–46.0)
Hemoglobin: 11.1 g/dL — ABNORMAL LOW (ref 12.0–15.0)
MCHC: 32.1 g/dL (ref 30.0–36.0)
MCV: 91.3 fl (ref 78.0–100.0)
Platelets: 287 10*3/uL (ref 150.0–400.0)
RBC: 3.78 Mil/uL — ABNORMAL LOW (ref 3.87–5.11)
RDW: 25.9 % — ABNORMAL HIGH (ref 11.5–15.5)
WBC: 8.6 10*3/uL (ref 4.0–10.5)

## 2015-12-01 ENCOUNTER — Telehealth: Payer: Self-pay | Admitting: Internal Medicine

## 2015-12-01 ENCOUNTER — Encounter (HOSPITAL_BASED_OUTPATIENT_CLINIC_OR_DEPARTMENT_OTHER): Payer: BLUE CROSS/BLUE SHIELD | Attending: Internal Medicine

## 2015-12-01 DIAGNOSIS — I83218 Varicose veins of right lower extremity with both ulcer of other part of lower extremity and inflammation: Secondary | ICD-10-CM | POA: Diagnosis not present

## 2015-12-01 DIAGNOSIS — M349 Systemic sclerosis, unspecified: Secondary | ICD-10-CM | POA: Diagnosis not present

## 2015-12-01 DIAGNOSIS — L97812 Non-pressure chronic ulcer of other part of right lower leg with fat layer exposed: Secondary | ICD-10-CM | POA: Diagnosis not present

## 2015-12-01 DIAGNOSIS — I272 Other secondary pulmonary hypertension: Secondary | ICD-10-CM | POA: Insufficient documentation

## 2015-12-01 NOTE — Telephone Encounter (Signed)
Spoke with patient and discussed lab results.

## 2015-12-01 NOTE — Telephone Encounter (Signed)
Please call pt with lab results at [phone]

## 2015-12-14 DIAGNOSIS — M069 Rheumatoid arthritis, unspecified: Secondary | ICD-10-CM | POA: Insufficient documentation

## 2015-12-15 DIAGNOSIS — L97812 Non-pressure chronic ulcer of other part of right lower leg with fat layer exposed: Secondary | ICD-10-CM | POA: Diagnosis not present

## 2015-12-15 DIAGNOSIS — I272 Other secondary pulmonary hypertension: Secondary | ICD-10-CM | POA: Diagnosis not present

## 2015-12-15 DIAGNOSIS — I83218 Varicose veins of right lower extremity with both ulcer of other part of lower extremity and inflammation: Secondary | ICD-10-CM | POA: Diagnosis not present

## 2015-12-15 DIAGNOSIS — M349 Systemic sclerosis, unspecified: Secondary | ICD-10-CM | POA: Diagnosis not present

## 2015-12-16 ENCOUNTER — Telehealth (HOSPITAL_COMMUNITY): Payer: Self-pay

## 2015-12-16 NOTE — Telephone Encounter (Signed)
Patient called to report no order received via fax to [facility] for her O2.  Per our records, this was faxed on the 13th (stamped).  Will refax order.  Patient aware and appreciative.  [name]

## 2015-12-22 DIAGNOSIS — I83218 Varicose veins of right lower extremity with both ulcer of other part of lower extremity and inflammation: Secondary | ICD-10-CM | POA: Diagnosis not present

## 2015-12-22 DIAGNOSIS — L97812 Non-pressure chronic ulcer of other part of right lower leg with fat layer exposed: Secondary | ICD-10-CM | POA: Diagnosis not present

## 2015-12-22 DIAGNOSIS — I272 Other secondary pulmonary hypertension: Secondary | ICD-10-CM | POA: Diagnosis not present

## 2015-12-22 DIAGNOSIS — M349 Systemic sclerosis, unspecified: Secondary | ICD-10-CM | POA: Diagnosis not present

## 2016-01-05 ENCOUNTER — Encounter (HOSPITAL_BASED_OUTPATIENT_CLINIC_OR_DEPARTMENT_OTHER): Payer: BLUE CROSS/BLUE SHIELD | Attending: Internal Medicine

## 2016-01-05 ENCOUNTER — Other Ambulatory Visit (INDEPENDENT_AMBULATORY_CARE_PROVIDER_SITE_OTHER): Payer: BLUE CROSS/BLUE SHIELD

## 2016-01-05 DIAGNOSIS — L942 Calcinosis cutis: Secondary | ICD-10-CM | POA: Insufficient documentation

## 2016-01-05 DIAGNOSIS — I272 Other secondary pulmonary hypertension: Secondary | ICD-10-CM

## 2016-01-05 DIAGNOSIS — I83228 Varicose veins of left lower extremity with both ulcer of other part of lower extremity and inflammation: Secondary | ICD-10-CM | POA: Diagnosis not present

## 2016-01-05 DIAGNOSIS — L97822 Non-pressure chronic ulcer of other part of left lower leg with fat layer exposed: Secondary | ICD-10-CM | POA: Insufficient documentation

## 2016-01-05 DIAGNOSIS — M349 Systemic sclerosis, unspecified: Secondary | ICD-10-CM | POA: Diagnosis not present

## 2016-01-05 LAB — BASIC METABOLIC PANEL
BUN: 37 mg/dL — ABNORMAL HIGH (ref 6–23)
CO2: 30 mEq/L (ref 19–32)
Calcium: 10.2 mg/dL (ref 8.4–10.5)
Chloride: 97 mEq/L (ref 96–112)
Creatinine, Ser: 1.26 mg/dL — ABNORMAL HIGH (ref 0.40–1.20)
GFR: 45.12 mL/min — ABNORMAL LOW (ref >60.00)
Glucose, Bld: 105 mg/dL — ABNORMAL HIGH (ref 70–99)
Potassium: 4.2 mEq/L (ref 3.5–5.1)
Sodium: 139 mEq/L (ref 135–145)

## 2016-01-11 ENCOUNTER — Telehealth (HOSPITAL_COMMUNITY): Payer: Self-pay | Admitting: Vascular Surgery

## 2016-01-11 NOTE — Telephone Encounter (Signed)
Pt called she states she left several messages since Friday afternoon no one has call her back, she needs to speak to to someone about a medication approval for Sildenfil, she also states that [facility] has sent request for this medication and no one has responded to their request. Pt would like a call back on this matter ASAP. Please advise

## 2016-01-12 ENCOUNTER — Other Ambulatory Visit (HOSPITAL_COMMUNITY): Payer: Self-pay | Admitting: *Deleted

## 2016-01-12 MED ORDER — SILDENAFIL CITRATE 20 MG PO TABS: 40.0000 mg | ORAL_TABLET | Freq: Three times a day (TID) | ORAL | Status: DC

## 2016-01-19 ENCOUNTER — Other Ambulatory Visit (HOSPITAL_COMMUNITY): Payer: Self-pay | Admitting: *Deleted

## 2016-01-19 DIAGNOSIS — I83228 Varicose veins of left lower extremity with both ulcer of other part of lower extremity and inflammation: Secondary | ICD-10-CM | POA: Diagnosis not present

## 2016-01-19 MED ORDER — TORSEMIDE 20 MG PO TABS: 40.0000 mg | ORAL_TABLET | Freq: Two times a day (BID) | ORAL | Status: DC

## 2016-01-24 ENCOUNTER — Telehealth (HOSPITAL_COMMUNITY): Payer: Self-pay | Admitting: Cardiology

## 2016-01-24 ENCOUNTER — Other Ambulatory Visit (HOSPITAL_COMMUNITY): Payer: Self-pay | Admitting: Cardiology

## 2016-01-24 MED ORDER — TORSEMIDE 20 MG PO TABS: 40.0000 mg | ORAL_TABLET | Freq: Two times a day (BID) | ORAL | Status: DC

## 2016-01-24 NOTE — Telephone Encounter (Signed)
Pt requested refill of torsemide to be sent into [facility] [place]

## 2016-01-31 ENCOUNTER — Other Ambulatory Visit: Payer: Self-pay | Admitting: Internal Medicine

## 2016-02-02 ENCOUNTER — Encounter (HOSPITAL_BASED_OUTPATIENT_CLINIC_OR_DEPARTMENT_OTHER): Payer: BLUE CROSS/BLUE SHIELD | Attending: Internal Medicine

## 2016-02-02 DIAGNOSIS — I83228 Varicose veins of left lower extremity with both ulcer of other part of lower extremity and inflammation: Secondary | ICD-10-CM | POA: Diagnosis not present

## 2016-02-02 DIAGNOSIS — L97822 Non-pressure chronic ulcer of other part of left lower leg with fat layer exposed: Secondary | ICD-10-CM | POA: Insufficient documentation

## 2016-02-02 DIAGNOSIS — M341 CR(E)ST syndrome: Secondary | ICD-10-CM | POA: Diagnosis not present

## 2016-02-14 ENCOUNTER — Other Ambulatory Visit (INDEPENDENT_AMBULATORY_CARE_PROVIDER_SITE_OTHER): Payer: BLUE CROSS/BLUE SHIELD

## 2016-02-14 ENCOUNTER — Other Ambulatory Visit: Payer: Self-pay | Admitting: Internal Medicine

## 2016-02-14 DIAGNOSIS — I272 Other secondary pulmonary hypertension: Secondary | ICD-10-CM

## 2016-02-14 LAB — COMPREHENSIVE METABOLIC PANEL
ALT: 20 U/L (ref 0–35)
AST: 27 U/L (ref 0–37)
Albumin: 4.4 g/dL (ref 3.5–5.2)
Alkaline Phosphatase: 102 U/L (ref 39–117)
BUN: 32 mg/dL — ABNORMAL HIGH (ref 6–23)
CO2: 28 mEq/L (ref 19–32)
Calcium: 9.6 mg/dL (ref 8.4–10.5)
Chloride: 100 mEq/L (ref 96–112)
Creatinine, Ser: 1.34 mg/dL — ABNORMAL HIGH (ref 0.40–1.20)
GFR: 42.01 mL/min — ABNORMAL LOW (ref >60.00)
Glucose, Bld: 96 mg/dL (ref 70–99)
Potassium: 3.9 mEq/L (ref 3.5–5.1)
Sodium: 139 mEq/L (ref 135–145)
Total Bilirubin: 0.5 mg/dL (ref 0.2–1.2)
Total Protein: 7.7 g/dL (ref 6.0–8.3)

## 2016-02-14 LAB — CBC
HCT: 35.4 % — ABNORMAL LOW (ref 36.0–46.0)
Hemoglobin: 11.9 g/dL — ABNORMAL LOW (ref 12.0–15.0)
MCHC: 33.5 g/dL (ref 30.0–36.0)
MCV: 96.6 fl (ref 78.0–100.0)
Platelets: 302 10*3/uL (ref 150.0–400.0)
RBC: 3.67 Mil/uL — ABNORMAL LOW (ref 3.87–5.11)
RDW: 14.6 % (ref 11.5–15.5)
WBC: 7.9 10*3/uL (ref 4.0–10.5)

## 2016-02-16 DIAGNOSIS — I83228 Varicose veins of left lower extremity with both ulcer of other part of lower extremity and inflammation: Secondary | ICD-10-CM | POA: Diagnosis not present

## 2016-02-24 DIAGNOSIS — M65831 Other synovitis and tenosynovitis, right forearm: Secondary | ICD-10-CM | POA: Insufficient documentation

## 2016-03-01 ENCOUNTER — Encounter (HOSPITAL_BASED_OUTPATIENT_CLINIC_OR_DEPARTMENT_OTHER): Payer: BLUE CROSS/BLUE SHIELD | Attending: Internal Medicine

## 2016-03-01 DIAGNOSIS — L97212 Non-pressure chronic ulcer of right calf with fat layer exposed: Secondary | ICD-10-CM | POA: Diagnosis not present

## 2016-03-01 DIAGNOSIS — L942 Calcinosis cutis: Secondary | ICD-10-CM | POA: Diagnosis not present

## 2016-03-01 DIAGNOSIS — I83222 Varicose veins of left lower extremity with both ulcer of calf and inflammation: Secondary | ICD-10-CM | POA: Diagnosis not present

## 2016-03-06 ENCOUNTER — Ambulatory Visit: Payer: BLUE CROSS/BLUE SHIELD | Admitting: Internal Medicine

## 2016-03-09 ENCOUNTER — Other Ambulatory Visit: Payer: Self-pay | Admitting: Geriatric Medicine

## 2016-03-09 ENCOUNTER — Ambulatory Visit: Payer: BLUE CROSS/BLUE SHIELD | Admitting: Internal Medicine

## 2016-03-09 ENCOUNTER — Telehealth: Payer: Self-pay | Admitting: Internal Medicine

## 2016-03-09 DIAGNOSIS — I2729 Other secondary pulmonary hypertension: Secondary | ICD-10-CM

## 2016-03-09 DIAGNOSIS — I779 Disorder of arteries and arterioles, unspecified: Secondary | ICD-10-CM

## 2016-03-09 MED ORDER — ATORVASTATIN CALCIUM 10 MG PO TABS: 10.0000 mg | ORAL_TABLET | Freq: Every day | ORAL | Status: DC

## 2016-03-09 MED ORDER — POTASSIUM CHLORIDE ER 10 MEQ PO TBCR: 10.0000 meq | EXTENDED_RELEASE_TABLET | Freq: Every day | ORAL | Status: DC

## 2016-03-09 MED ORDER — GABAPENTIN 300 MG PO CAPS: ORAL_CAPSULE | ORAL | Status: DC

## 2016-03-09 MED ORDER — CITALOPRAM HYDROBROMIDE 20 MG PO TABS: 20.0000 mg | ORAL_TABLET | Freq: Every day | ORAL | Status: DC

## 2016-03-09 MED ORDER — SPIRONOLACTONE 25 MG PO TABS: 25.0000 mg | ORAL_TABLET | Freq: Every day | ORAL | Status: DC

## 2016-03-09 MED ORDER — TORSEMIDE 20 MG PO TABS: 40.0000 mg | ORAL_TABLET | Freq: Two times a day (BID) | ORAL | Status: DC

## 2016-03-09 MED ORDER — MACITENTAN 10 MG PO TABS: 10.0000 mg | ORAL_TABLET | Freq: Every day | ORAL | Status: DC

## 2016-03-09 MED ORDER — CLOPIDOGREL BISULFATE 75 MG PO TABS: 75.0000 mg | ORAL_TABLET | Freq: Every day | ORAL | Status: DC

## 2016-03-09 MED ORDER — PANTOPRAZOLE SODIUM 40 MG PO TBEC: 40.0000 mg | DELAYED_RELEASE_TABLET | Freq: Every day | ORAL | Status: DC

## 2016-03-09 MED ORDER — SILDENAFIL CITRATE 20 MG PO TABS: 40.0000 mg | ORAL_TABLET | Freq: Three times a day (TID) | ORAL | Status: DC

## 2016-03-09 MED ORDER — PREDNISONE 5 MG PO TABS: ORAL_TABLET | ORAL | Status: DC

## 2016-03-09 NOTE — Telephone Encounter (Signed)
Sent to pharmacy 

## 2016-03-09 NOTE — Telephone Encounter (Signed)
i called [name] [name] and r/s her to the end of April. Please fill her maintenance meds to the pharmacy on file.

## 2016-03-15 ENCOUNTER — Other Ambulatory Visit (INDEPENDENT_AMBULATORY_CARE_PROVIDER_SITE_OTHER): Payer: BLUE CROSS/BLUE SHIELD

## 2016-03-15 DIAGNOSIS — I272 Other secondary pulmonary hypertension: Secondary | ICD-10-CM | POA: Diagnosis not present

## 2016-03-15 DIAGNOSIS — I270 Primary pulmonary hypertension: Secondary | ICD-10-CM

## 2016-03-15 DIAGNOSIS — I83222 Varicose veins of left lower extremity with both ulcer of calf and inflammation: Secondary | ICD-10-CM | POA: Diagnosis not present

## 2016-03-15 LAB — COMPREHENSIVE METABOLIC PANEL
ALT: 26 U/L (ref 0–35)
AST: 32 U/L (ref 0–37)
Albumin: 4.5 g/dL (ref 3.5–5.2)
Alkaline Phosphatase: 109 U/L (ref 39–117)
BUN: 41 mg/dL — ABNORMAL HIGH (ref 6–23)
CO2: 28 mEq/L (ref 19–32)
Calcium: 9.7 mg/dL (ref 8.4–10.5)
Chloride: 98 mEq/L (ref 96–112)
Creatinine, Ser: 1.36 mg/dL — ABNORMAL HIGH (ref 0.40–1.20)
GFR: 41.29 mL/min — ABNORMAL LOW (ref >60.00)
Glucose, Bld: 113 mg/dL — ABNORMAL HIGH (ref 70–99)
Potassium: 3.9 mEq/L (ref 3.5–5.1)
Sodium: 138 mEq/L (ref 135–145)
Total Bilirubin: 0.5 mg/dL (ref 0.2–1.2)
Total Protein: 7.9 g/dL (ref 6.0–8.3)

## 2016-03-15 LAB — HEPATIC FUNCTION PANEL
ALT: 26 U/L (ref 0–35)
AST: 32 U/L (ref 0–37)
Albumin: 4.5 g/dL (ref 3.5–5.2)
Alkaline Phosphatase: 109 U/L (ref 39–117)
Bilirubin, Direct: 0.1 mg/dL (ref 0.0–0.3)
Total Bilirubin: 0.5 mg/dL (ref 0.2–1.2)
Total Protein: 7.9 g/dL (ref 6.0–8.3)

## 2016-03-15 LAB — CBC
HCT: 36.6 % (ref 36.0–46.0)
Hemoglobin: 12.1 g/dL (ref 12.0–15.0)
MCHC: 33 g/dL (ref 30.0–36.0)
MCV: 97.3 fl (ref 78.0–100.0)
Platelets: 281 10*3/uL (ref 150.0–400.0)
RBC: 3.76 Mil/uL — ABNORMAL LOW (ref 3.87–5.11)
RDW: 14.7 % (ref 11.5–15.5)
WBC: 8.2 10*3/uL (ref 4.0–10.5)

## 2016-03-29 DIAGNOSIS — I83222 Varicose veins of left lower extremity with both ulcer of calf and inflammation: Secondary | ICD-10-CM | POA: Diagnosis not present

## 2016-04-12 ENCOUNTER — Encounter (HOSPITAL_BASED_OUTPATIENT_CLINIC_OR_DEPARTMENT_OTHER): Payer: BLUE CROSS/BLUE SHIELD | Attending: Internal Medicine

## 2016-04-12 DIAGNOSIS — L97811 Non-pressure chronic ulcer of other part of right lower leg limited to breakdown of skin: Secondary | ICD-10-CM | POA: Diagnosis not present

## 2016-04-17 ENCOUNTER — Other Ambulatory Visit (INDEPENDENT_AMBULATORY_CARE_PROVIDER_SITE_OTHER): Payer: BLUE CROSS/BLUE SHIELD

## 2016-04-17 DIAGNOSIS — I272 Other secondary pulmonary hypertension: Secondary | ICD-10-CM | POA: Diagnosis not present

## 2016-04-17 LAB — CBC
HCT: 32 % — ABNORMAL LOW (ref 36.0–46.0)
Hemoglobin: 10.6 g/dL — ABNORMAL LOW (ref 12.0–15.0)
MCHC: 33.2 g/dL (ref 30.0–36.0)
MCV: 98.4 fl (ref 78.0–100.0)
Platelets: 320 10*3/uL (ref 150.0–400.0)
RBC: 3.26 Mil/uL — ABNORMAL LOW (ref 3.87–5.11)
RDW: 15 % (ref 11.5–15.5)
WBC: 6.8 10*3/uL (ref 4.0–10.5)

## 2016-04-17 LAB — COMPREHENSIVE METABOLIC PANEL
ALT: 19 U/L (ref 0–35)
AST: 26 U/L (ref 0–37)
Albumin: 4.3 g/dL (ref 3.5–5.2)
Alkaline Phosphatase: 103 U/L (ref 39–117)
BUN: 42 mg/dL — ABNORMAL HIGH (ref 6–23)
CO2: 31 mEq/L (ref 19–32)
Calcium: 9.6 mg/dL (ref 8.4–10.5)
Chloride: 96 mEq/L (ref 96–112)
Creatinine, Ser: 1.29 mg/dL — ABNORMAL HIGH (ref 0.40–1.20)
GFR: 43.87 mL/min — ABNORMAL LOW (ref >60.00)
Glucose, Bld: 152 mg/dL — ABNORMAL HIGH (ref 70–99)
Potassium: 4.2 mEq/L (ref 3.5–5.1)
Sodium: 136 mEq/L (ref 135–145)
Total Bilirubin: 0.4 mg/dL (ref 0.2–1.2)
Total Protein: 7.6 g/dL (ref 6.0–8.3)

## 2016-04-17 LAB — HEPATIC FUNCTION PANEL
ALT: 19 U/L (ref 0–35)
AST: 26 U/L (ref 0–37)
Albumin: 4.3 g/dL (ref 3.5–5.2)
Alkaline Phosphatase: 103 U/L (ref 39–117)
Bilirubin, Direct: 0.1 mg/dL (ref 0.0–0.3)
Total Bilirubin: 0.4 mg/dL (ref 0.2–1.2)
Total Protein: 7.6 g/dL (ref 6.0–8.3)

## 2016-04-24 ENCOUNTER — Ambulatory Visit (INDEPENDENT_AMBULATORY_CARE_PROVIDER_SITE_OTHER): Payer: BLUE CROSS/BLUE SHIELD | Admitting: Internal Medicine

## 2016-04-24 ENCOUNTER — Encounter: Payer: Self-pay | Admitting: Internal Medicine

## 2016-04-24 VITALS — BP 118/58 | HR 80 | Temp 97.8°F | Resp 16 | Ht 67.0 in | Wt 137.0 lb

## 2016-04-24 DIAGNOSIS — D509 Iron deficiency anemia, unspecified: Secondary | ICD-10-CM | POA: Diagnosis not present

## 2016-04-24 DIAGNOSIS — F329 Major depressive disorder, single episode, unspecified: Secondary | ICD-10-CM | POA: Diagnosis not present

## 2016-04-24 DIAGNOSIS — F32A Depression, unspecified: Secondary | ICD-10-CM

## 2016-04-24 MED ORDER — CITALOPRAM HYDROBROMIDE 20 MG PO TABS: 20.0000 mg | ORAL_TABLET | Freq: Every day | ORAL | Status: DC

## 2016-04-24 NOTE — Assessment & Plan Note (Signed)
Still taking iron and B12, will continue with monthly CBC and will add ferritin to next draw. No overt blood loss but she is taking prednisone. Is still taking protonix daily.

## 2016-04-24 NOTE — Progress Notes (Signed)
Pre visit review using our clinic review tool, if applicable. No additional management support is needed unless otherwise documented below in the visit note.

## 2016-04-24 NOTE — Progress Notes (Signed)
Subjective:    [name], female    DOB: Nov 30, 1951, 65 y.o.   MRN: [id number]  HPI The patient is a 64 YO female coming in for follow up on her iron deficiency anemia. Her last blood counts were slightly down and she is worried. She is still taking her iron pills and this causes her to have dark stools. No blood that she has seen and no new bleeding or nose bleeds. She denies more SOB. She is having some more dysphoric moods since her husband is going through a bad infection in his back and she is having to do more things for him. This is causing her more stress and her celexa is not working well enough right now.   Review of Systems  Constitutional: Negative for fever, chills, activity change and fatigue.  HENT: Negative for ear discharge, ear pain, sinus pressure, sore throat and trouble swallowing.   Respiratory: Positive for shortness of breath. Negative for cough, chest tightness and wheezing.   Cardiovascular: Negative for chest pain, palpitations and leg swelling.  Gastrointestinal: Negative for nausea, vomiting, abdominal pain, diarrhea, constipation and abdominal distention.  Musculoskeletal: Negative.   Neurological: Negative.   Psychiatric/Behavioral: Positive for dysphoric mood. Negative for behavioral problems, sleep disturbance, decreased concentration and agitation. The patient is nervous/anxious.       Objective:   Physical Exam  Constitutional: She appears well-developed and well-nourished. No distress.  HENT:  Head: Normocephalic and atraumatic.  Eyes: EOM are normal.  Neck: Normal range of motion.  Cardiovascular: Normal rate and regular rhythm.   Pulmonary/Chest: Effort normal and breath sounds normal. No respiratory distress. She has no wheezes. She has no rales.  Wearing oxygen  Abdominal: Soft. Bowel sounds are normal. She exhibits no distension. There is no tenderness. There is no rebound.  Skin: Skin is warm.  Her skin has stigmata of  scleroderma and tight skin.    Filed Vitals:   04/24/16 1128  BP: 118/58  Pulse: 80  Temp: 97.8 F (36.6 C)  TempSrc: Oral  Resp: 16  Height: 5' 7" (1.702 m)  Weight: 137 lb (62.143 kg)       Assessment & Plan:

## 2016-04-24 NOTE — Patient Instructions (Signed)
We will increase the celexa to 1.5 pills daily and then to 2 pills daily if needed.

## 2016-04-24 NOTE — Assessment & Plan Note (Signed)
Worsened with the social situation and will increase celexa to 30 mg daily for 2 weeks and then to 40 mg daily. Can try to decrease once the stressor is passed.

## 2016-04-26 DIAGNOSIS — L97811 Non-pressure chronic ulcer of other part of right lower leg limited to breakdown of skin: Secondary | ICD-10-CM | POA: Diagnosis not present

## 2016-04-29 ENCOUNTER — Other Ambulatory Visit: Payer: Self-pay | Admitting: Internal Medicine

## 2016-04-30 ENCOUNTER — Telehealth (HOSPITAL_COMMUNITY): Payer: Self-pay | Admitting: *Deleted

## 2016-04-30 NOTE — Telephone Encounter (Signed)
Entered in [place]

## 2016-05-01 ENCOUNTER — Ambulatory Visit (HOSPITAL_BASED_OUTPATIENT_CLINIC_OR_DEPARTMENT_OTHER)
Admission: RE | Admit: 2016-05-01 | Discharge: 2016-05-01 | Disposition: A | Discharge status: Home / Self Care | Payer: BLUE CROSS/BLUE SHIELD | Source: Ambulatory Visit | Attending: Internal Medicine | Admitting: Internal Medicine

## 2016-05-01 ENCOUNTER — Ambulatory Visit (HOSPITAL_COMMUNITY)
Admission: RE | Admit: 2016-05-01 | Discharge: 2016-05-01 | Disposition: A | Discharge status: Home / Self Care | Payer: BLUE CROSS/BLUE SHIELD | Source: Ambulatory Visit | Attending: Internal Medicine | Admitting: Internal Medicine

## 2016-05-01 VITALS — BP 138/70 | HR 75 | Wt 135.0 lb

## 2016-05-01 DIAGNOSIS — I340 Nonrheumatic mitral (valve) insufficiency: Secondary | ICD-10-CM | POA: Insufficient documentation

## 2016-05-01 DIAGNOSIS — I071 Rheumatic tricuspid insufficiency: Secondary | ICD-10-CM | POA: Insufficient documentation

## 2016-05-01 DIAGNOSIS — I779 Disorder of arteries and arterioles, unspecified: Secondary | ICD-10-CM

## 2016-05-01 DIAGNOSIS — I2729 Other secondary pulmonary hypertension: Secondary | ICD-10-CM

## 2016-05-01 DIAGNOSIS — I272 Other secondary pulmonary hypertension: Secondary | ICD-10-CM

## 2016-05-01 DIAGNOSIS — I371 Nonrheumatic pulmonary valve insufficiency: Secondary | ICD-10-CM | POA: Diagnosis not present

## 2016-05-01 DIAGNOSIS — I517 Cardiomegaly: Secondary | ICD-10-CM | POA: Diagnosis not present

## 2016-05-01 MED ORDER — TADALAFIL (PAH) 20 MG PO TABS: 40.0000 mg | ORAL_TABLET | Freq: Every day | ORAL | Status: DC

## 2016-05-01 NOTE — Progress Notes (Signed)
PULMONARY HYPERTENSION CLINIC  [name], female   DOB: 1951/01/05, 65 y.o.   MRN: [id number] [name], female   DOB: November 17, 1951, 65 y.o.   MRN: [id number] Primary Cardiologist: Dr. [name] Pulmonologist: Dr. Lamonte Sakai  HPI: [name] is a 65 y.o. 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    Diagnosed with PAH in 2007 in [place]. We saw her in 2013.   RHC 04/18/12  RA = 9  RV = 75/11/14  PA = 71/25 (44)  PCW = 4  Fick cardiac output/index = 3.0/1.7  Thermo CO/CI = 2.7/1.5  PVR = 13.3 Woods  FA sat = 79% on room air (checked 3 times). Sat with finger probe on air 94%  PA sat = 41%, 43%   6 MW 04/18/12 880 feet 6 MW 11/10/12 650 feet. Limited by legs. Sats down to 85% on 5L (sats at rest 95%)  6 MW 3/15 915 feet (279 m) 6 MW 2/16 930 feet (283 m)  Echo 10/13 LV EF 65-70%. RV severely enlarged/HK severe TR. RVSP 17mHg Echo (10/15) with EF 60-65%, D-shaped interventricular septum, moderately dilated RV with moderately decreased systolic function, PA systolic pressure 72 mmHg, IVC not dilated.    Repeat RHC 11/14/12  RA = 14  RV = 84/8/20  PA = 83/29 (50)  PCW = 10  Fick cardiac output/index = 4.5/2.5  Thermo CO/CI = 3.8/2.2  PVR = 8.9 Woods (Fick)  FA sat = 86% on 3L  PA sat = 55%, 57  [facility] started on IV epoprostenol (Veletri) in 01/2013. She had intolerable side effects with Veletri.  [facility] Veletri wean and Tyvaso initiation.  She was intolerant of 12 breaths of Tyvaso but able to tolerate 6 breaths 3 times a day. Stopped Tyvaso in 2015. In February 2016 switched bosentan to Macitentan. Now on Macitentan 10 and sildenafil 40 tid.   Here for f/u: She is stable symptomatically and is able to walk around in stores with her oxygen without significant problem,  uses 2-3L O2. She is very stressed b the fact that her husband has been quite ill ad in the hospital. He is scheduled to come home in the next day or two. She has chronic lower extremity ulcers on the left followed at the wound clinic and her legs are wrapped. These are healing. Dr BAmil Amenrecently decreased prednisone to 2.5 daily  Now on macitentan 10 and revatio 40 tid. No syncope or significant edema.   Echo (05/01/16) with EF 60-65%, Mild flattening of interventricular septum, moderately dilated RV with moderately decreased systolic function, PA systolic pressure ~~89mmHg, IVC dilated.   ABI 4/15: R 0.86 L 1.0     Labs (3/15): K 3.9, creatinine 1.0, LFTs normal Labs (05/17/14): K 3.4 Cr 1.1 Bun 40 LFTs ok  Labs (6/15): K 4.3, creatinine 1.15 Labs (8/15): creatinine 1.4 Labs (10/15): LFTs normal Labs (2/16): LFTs normal  Review of Systems: All pertinent positives and negatives as in HPI, otherwise negative.   Past Medical History  Diagnosis Date  . History of chicken pox   . Hypertension   . Sclerodermia generalized ([place]   . Non Hodgkin's lymphoma ([facility]     Treated with chemo 2010, felt to be cured.  . Melanoma ([place] 1999  . Peripheral arterial  occlusive disease ([place])   . 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[place])   . DOE (dyspnea on exertion)   . PAH (pulmonary artery hypertension) ([place])   . Epistaxis   . Pulmonary hypertension ([facility])     Current Outpatient Prescriptions  Medication Sig Dispense Refill  . acidophilus (RISAQUAD) CAPS Take 1 capsule by mouth every morning.     Marland Kitchen aspirin 81 MG tablet Take 1 tablet (81 mg total) by mouth every evening. Resume in 1 week. 30 tablet   . atorvastatin (LIPITOR) 10 MG tablet Take 1 tablet (10 mg total) by mouth daily. 30 tablet 1  . BIOTIN PO Take 1 tablet by mouth daily.    . cholecalciferol (VITAMIN D) 1000 UNITS tablet Take 2,000 Units by mouth every evening.     . citalopram (CELEXA) 20 MG tablet Take 30 mg by mouth daily.    . clopidogrel  (PLAVIX) 75 MG tablet take 1 tablet by mouth once daily 30 tablet 5  . fish oil-omega-3 fatty acids 1000 MG capsule Take 1 g by mouth daily with lunch.     . gabapentin (NEURONTIN) 300 MG capsule take 1 capsule by mouth every morning and 2 every evening 180 capsule 1  . Macitentan (OPSUMIT) 10 MG TABS Take 10 mg by mouth daily. 30 tablet 1  . moxifloxacin (VIGAMOX) 0.5 % ophthalmic solution Place 1 drop into both eyes 3 (three) times daily as needed (allergies).     . multivitamin (THERAGRAN) per tablet Take 2 tablets by mouth 2 (two) times daily.     . OXYGEN Inhale 2 L into the lungs continuous.    . pantoprazole (PROTONIX) 40 MG tablet Take 1 tablet (40 mg total) by mouth daily. 30 tablet 1  . POLY-IRON 150 150 MG capsule take 1 capsule by mouth twice a day 60 capsule 2  . potassium chloride (K-DUR) 10 MEQ tablet Take 1 tablet (10 mEq total) by mouth daily. 30 tablet 1  . predniSONE (DELTASONE) 5 MG tablet take 1 tablet by mouth once daily WITH BREAKFAST (Patient taking differently: Take 2.5 mg by mouth. take 1 tablet by mouth once daily WITH BREAKFAST) 30 tablet 1  . sildenafil (REVATIO) 20 MG tablet Take 2 tablets (40 mg total) by mouth 3 (three) times daily. 180 tablet 1  . spironolactone (ALDACTONE) 25 MG tablet Take 1 tablet (25 mg total) by mouth daily. 30 tablet 1  . torsemide (DEMADEX) 20 MG tablet Take 2 tablets (40 mg total) by mouth 2 (two) times daily. 120 tablet 1  . vitamin B-12 (CYANOCOBALAMIN) 500 MCG tablet Take 500 mcg by mouth every morning.      No current facility-administered medications for this encounter.    Allergies  Allergen Reactions  . Sulfa Antibiotics     Severe rash with significant peeling of face. No airway involvement per patient  . Levaquin [Levofloxacin In D5w] Other (See Comments)    Other reaction(s): Other (See Comments) FLU LIKE SYMPTOM  . Penicillins Rash     Filed Vitals:   05/01/16 1357  BP: 138/70  Pulse: 75  Weight: 135 lb (61.236 kg)     PHYSICAL EXAM: General: No acute distress No respiratory difficulty HEENT: normal Neck: supple. JVP 5-6. Carotids 2+ bilat; no bruits. No lymphadenopathy or thryomegaly appreciated. Cor: PMI nondisplaced. Regular rate & rhythm. Prominent P2. No murmur. + RV lift Lungs: clear on 4 liters [place] Abdomen: soft, nontender, nondistended. No hepatosplenomegaly. No bruits or masses. Good bowel sounds. Extremities: Severe sclerodactyly with  mild cyanosis and joint deformity. + gouty nodules. LEs wrapped due to ulcers. No significant edema.  Neuro: alert & oriented x 3, cranial nerves grossly intact. moves all 4 extremities w/o difficulty. Affect pleasant.  ASSESSMENT & PLAN:  1. 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  Unable to tolerate IV epoprostenol and with no improvement on Tyvaso.  She stopped Tyvaso and 6 minute walkshowed no deterioration.  She remains on Revatio and macitentan.  WHO class II-III dyspnea.  Last echo showed moderately dilated and moderately dysfunctional RV with persistent severe pulmonary hypertension.   - Continue macitentan at current dose.  - Switch revatio to adcirca 40. Consider selaxepg at next visit  - Refuses 6MW today as her husband is sick in hospital and she is not up to it.   - She is not volume overloaded on exam.  Continue current torsemide. .  - Continue home oxygen.  2. Scleroderma: Followed by Dr. Amil Amen. 3. Chronic L Eulceration: Followed by Wound Care.   4. PAD: Had ABIs in 4/16.  She is on Plavix and atorva. Follows with [facility]. 5. Interstitial lung disease: In setting of scleroderma.  She is on home oxygen and followed by pulmonary.   Bensimhon, Daniel,MD 05/01/2016

## 2016-05-01 NOTE — Progress Notes (Signed)
  Echocardiogram 2D Echocardiogram has been performed.  Johny Chess 05/01/2016, 1:40 PM

## 2016-05-01 NOTE — Patient Instructions (Addendum)
When you get the Adcirca 40 mg daily stop the Revatio  We will contact you in 4 months to schedule your next appointment.

## 2016-05-02 ENCOUNTER — Ambulatory Visit (INDEPENDENT_AMBULATORY_CARE_PROVIDER_SITE_OTHER): Payer: BLUE CROSS/BLUE SHIELD | Admitting: Emergency Medicine

## 2016-05-02 ENCOUNTER — Encounter: Payer: Self-pay | Admitting: Emergency Medicine

## 2016-05-02 ENCOUNTER — Encounter: Payer: Self-pay | Admitting: Family

## 2016-05-02 ENCOUNTER — Other Ambulatory Visit (INDEPENDENT_AMBULATORY_CARE_PROVIDER_SITE_OTHER): Payer: BLUE CROSS/BLUE SHIELD

## 2016-05-02 ENCOUNTER — Ambulatory Visit (INDEPENDENT_AMBULATORY_CARE_PROVIDER_SITE_OTHER): Payer: BLUE CROSS/BLUE SHIELD | Admitting: Family

## 2016-05-02 VITALS — BP 104/58 | HR 60 | Temp 98.1°F | Resp 14 | Ht 67.0 in | Wt 136.0 lb

## 2016-05-02 VITALS — BP 90/52 | HR 76 | Ht 68.0 in | Wt 138.0 lb

## 2016-05-02 DIAGNOSIS — I2729 Other secondary pulmonary hypertension: Secondary | ICD-10-CM

## 2016-05-02 DIAGNOSIS — J849 Interstitial pulmonary disease, unspecified: Secondary | ICD-10-CM | POA: Diagnosis not present

## 2016-05-02 DIAGNOSIS — D649 Anemia, unspecified: Secondary | ICD-10-CM | POA: Diagnosis not present

## 2016-05-02 DIAGNOSIS — L940 Localized scleroderma [morphea]: Secondary | ICD-10-CM

## 2016-05-02 DIAGNOSIS — I272 Other secondary pulmonary hypertension: Secondary | ICD-10-CM | POA: Diagnosis not present

## 2016-05-02 LAB — IBC PANEL
Iron: 19 ug/dL — ABNORMAL LOW (ref 42–145)
Saturation Ratios: 4.9 % — ABNORMAL LOW (ref 20.0–50.0)
Transferrin: 276 mg/dL (ref 212.0–360.0)

## 2016-05-02 LAB — CBC
HCT: 29.2 % — ABNORMAL LOW (ref 36.0–46.0)
Hemoglobin: 9.8 g/dL — ABNORMAL LOW (ref 12.0–15.0)
MCHC: 33.5 g/dL (ref 30.0–36.0)
MCV: 97.3 fl (ref 78.0–100.0)
Platelets: 266 10*3/uL (ref 150.0–400.0)
RBC: 3.01 Mil/uL — ABNORMAL LOW (ref 3.87–5.11)
RDW: 15.4 % (ref 11.5–15.5)
WBC: 13 10*3/uL — ABNORMAL HIGH (ref 4.0–10.5)

## 2016-05-02 LAB — FERRITIN: Ferritin: 56.3 ng/mL (ref 10.0–291.0)

## 2016-05-02 LAB — TSH: TSH: 1.67 u[IU]/mL (ref 0.35–4.50)

## 2016-05-02 NOTE — Assessment & Plan Note (Signed)
Start Adcirca in place of Revatio when the med is available. Continue your macitentan.  Continue your oxygen at 2-3 L/min.

## 2016-05-02 NOTE — Patient Instructions (Signed)
Thank you for choosing [facility].  Summary/Instructions:  Please continue to take your medications as prescribed.  We will follow-up after receiving your blood work results.  Please stop by the lab on the basement level of the building for your blood work. Your results will be released to [place] (or called to you) after review, usually within 72 hours after test completion. If any changes need to be made, you will be notified at that same time.  If your symptoms worsen or fail to improve, please contact our office for further instruction, or in case of emergency go directly to the emergency room at the closest medical facility.

## 2016-05-02 NOTE — Progress Notes (Signed)
Subjective:    [name], female    DOB: 08/21/1951, 65 y.o.   MRN: [id number]  Chief Complaint  Patient presents with  . Hot Flashes    has been having issues with getting really hot and really cold, wanted to check hemaglobin since it was low last time it was checked    HPI:  [name] [name] is a 65 y.o. 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 and presents today for an acute office visit.  Associated symptom of temperature intolerance has been going on for a couple of days. She was recently seen by cardiology and had chills and was very cold to the point that she was shivering some. She was noted to have decreasing hemoglobin. Denies symptoms of cold, cough or flu. No previous history of thyroid issues. She has been under a significant amount of stress recently secondary to family concerns of her husband being in the hospital. Did have menopausal symptoms in her early 28s, but none since then.   Allergies  Allergen Reactions  . Sulfa Antibiotics     Severe rash with significant peeling of face. No airway involvement per patient  . Levaquin [Levofloxacin In D5w] Other (See Comments)    Other reaction(s): Other (See Comments) FLU LIKE SYMPTOM  . Penicillins Rash     Current Outpatient Prescriptions on File Prior to Visit  Medication Sig Dispense Refill  . acidophilus (RISAQUAD) CAPS Take 1 capsule by mouth every morning.     Marland Kitchen aspirin 81 MG tablet Take 1 tablet (81 mg total) by mouth every evening. Resume in 1 week. 30 tablet   . atorvastatin (LIPITOR) 10 MG tablet Take 1 tablet (10 mg total) by mouth daily. 30 tablet 1  . BIOTIN PO Take 1 tablet by mouth  daily.    . cholecalciferol (VITAMIN D) 1000 UNITS tablet Take 2,000 Units by mouth every evening.     . citalopram (CELEXA) 20 MG tablet Take 30 mg by mouth daily.    . clopidogrel (PLAVIX) 75 MG tablet take 1 tablet by mouth once daily 30 tablet 5  . fish oil-omega-3 fatty acids 1000 MG capsule Take 1 g by mouth daily with lunch.     . gabapentin (NEURONTIN) 300 MG capsule take 1 capsule by mouth every morning and 2 every evening 180 capsule 1  . Macitentan (OPSUMIT) 10 MG TABS Take 10 mg by mouth daily. 30 tablet 1  . moxifloxacin (VIGAMOX) 0.5 % ophthalmic solution Place 1 drop into both eyes 3 (three) times daily as needed (allergies).     . multivitamin (THERAGRAN) per tablet Take 2 tablets by mouth 2 (two) times daily.     . OXYGEN Inhale 2 L into the lungs continuous.    . pantoprazole (PROTONIX) 40 MG tablet Take 1 tablet (40 mg total) by mouth daily. 30 tablet 1  . POLY-IRON 150 150 MG capsule take 1 capsule by mouth twice a day 60 capsule 2  . potassium chloride (K-DUR) 10 MEQ tablet Take 1 tablet (10 mEq total) by mouth daily. 30 tablet 1  . predniSONE (DELTASONE) 5 MG tablet take 1 tablet by mouth once daily WITH BREAKFAST (Patient taking differently: Take 2.5 mg by mouth. take 1 tablet by mouth  once daily WITH BREAKFAST) 30 tablet 1  . spironolactone (ALDACTONE) 25 MG tablet Take 1 tablet (25 mg total) by mouth daily. 30 tablet 1  . Tadalafil, PAH, (ADCIRCA) 20 MG TABS Take 2 tablets (40 mg total) by mouth daily. 60 tablet 6  . torsemide (DEMADEX) 20 MG tablet Take 2 tablets (40 mg total) by mouth 2 (two) times daily. 120 tablet 1  . vitamin B-12 (CYANOCOBALAMIN) 500 MCG tablet Take 500 mcg by mouth every morning.      No current facility-administered medications on file prior to visit.     Past Surgical History  Procedure Laterality Date  . Rt little toe removal  10/1998  . Stent left thigh  3/11  . Tonsillectomy and adenoidectomy    . Small intestine surgery    . Biopsy  on cerebellum    . Vein ligation and stripping  05/16/2012    Procedure: VEIN LIGATION AND STRIPPING;  Surgeon: [name], MD;  Location: [facility] OR;  Service: Vascular;  Laterality: Right;  Irrigation and Debridement right lower leg, ligation of saphenous vein.  . Endovenous ablation saphenous vein w/ laser  07-24-2012    right greater saphenous vein by [name], M.D.   . Endovenous ablation saphenous vein w/ laser  10-16-2012    left greater saphenous vein  by Dr. [name]  . Apligraph  12-04-2102,   12-18-2012    bilateral calves   [name]  . Tunneled venous catheter placement  01/2013  . Right heart catheterization N/A 11/14/2012    Procedure: RIGHT HEART CATH;  Surgeon: [name], MD;  Location: [facility] CATH LAB;  Service: Cardiovascular;  Laterality: N/A;  . Endovenous ablation saphenous vein w/ laser Right 09-01-2015    endovenous laser ablation right greater saphenous vein by [name] MD  . Esophagogastroduodenoscopy (egd) with propofol Left 10/07/2015    Procedure: ESOPHAGOGASTRODUODENOSCOPY (EGD) WITH PROPOFOL;  Surgeon: [name], MD;  Location: [facility];  Service: Endoscopy;  Laterality: Left;  . Colonoscopy with propofol Left 10/07/2015    Procedure: COLONOSCOPY WITH PROPOFOL;  Surgeon: [name], MD;  Location: [facility];  Service: Endoscopy;  Laterality: Left;     Review of Systems  Constitutional: Negative for fever and chills.  Respiratory: Negative for chest tightness and shortness of breath.   Cardiovascular: Negative for chest pain, palpitations and leg swelling.  Endocrine: Positive for cold intolerance and heat intolerance. Negative for polydipsia, polyphagia and polyuria.  Neurological: Negative for dizziness and weakness.      Objective:    BP 104/58 mmHg  Pulse 60  Temp(Src) 98.1 F (36.7 C) (Oral)  Resp 14  Ht 5' 7" (1.702 m)  Wt 136 lb (61.689 kg)  BMI 21.30 kg/m2 Nursing note and vital signs reviewed.  Physical  Exam  Constitutional: She is oriented to person, place, and time. She appears well-developed and well-nourished. No distress.  Seated in chair with nasal cannula and pressurized oxygen.   Eyes:  Mild pallor under lower eyelids present.   Cardiovascular: Normal rate, regular rhythm, normal heart sounds and intact distal pulses.   Pulmonary/Chest: Effort normal and breath sounds normal.  Neurological: She is alert and oriented to person, place, and time.  Skin: Skin is warm and dry.  Psychiatric: She has a normal mood and affect. Her behavior is normal. Judgment and thought content normal.       Assessment & Plan:   Problem List Items Addressed This Visit  Other   Absolute anemia - Primary    Symptoms and exam with possible metabolic origin although a viral infection cannot be ruled out. Obtain CBC, IBC panel, ferritin, and TSH. Continue current dosage of ferrous sulfate pending blood work results.       Relevant Orders   CBC   TSH   Ferritin   IBC panel       I am having [name] [name] maintain her multivitamin, vitamin B-12, acidophilus, fish oil-omega-3 fatty acids, cholecalciferol, moxifloxacin, OXYGEN, BIOTIN PO, aspirin, clopidogrel, atorvastatin, gabapentin, Macitentan, pantoprazole, potassium chloride, spironolactone, predniSONE, torsemide, POLY-IRON 150, citalopram, and Tadalafil (PAH).    Follow-up: Return if symptoms worsen or fail to improve.  Mauricio Po, FNP

## 2016-05-02 NOTE — Assessment & Plan Note (Signed)
Please continue your prednisone 2.45m daily for now.  Continue your oxygen at 2-3 L/min.  We will perform a CT scan of your chest to compare with your priors.

## 2016-05-02 NOTE — Patient Instructions (Addendum)
Please continue your prednisone 2.65m daily for now.  Start Adcirca in place of Revatio when the med is available. Continue your macitentan.  Continue your oxygen at 2-3 L/min.  We will perform a CT scan of your chest to compare with your priors.  Follow with Dr BLamonte Sakaiin 3 months or sooner if you have any problems.

## 2016-05-02 NOTE — Assessment & Plan Note (Signed)
Symptoms and exam with possible metabolic origin although a viral infection cannot be ruled out. Obtain CBC, IBC panel, ferritin, and TSH. Continue current dosage of ferrous sulfate pending blood work results.

## 2016-05-02 NOTE — Progress Notes (Signed)
Subjective:    [name], female    DOB: Jan 12, 1951, 65 y.o.   MRN: [id number] HPI 65 yo woman, never smoker, hx of scleroderma, Raynaud's, non-Hodgkins lymphoma (treated '10), HTN, PAH originally dx in [place] and treated, but meds d/c'd when she improved. Has been seen in by Dr [name], had R heart cath as below in 03/2012. She has been restarted on therapy, seen both in [place] and at [facility] >> Tracleer + Revatio + Veletri (since 2/'14). She has felt worse on IV therapy, progressive as it titrated up.  Dr Gilles Chiquito scaled back her veletri for this reason. She has had progressive dyspnea - was just started prednisone 78m qd. She has benefited significantly, better exertional tolerance and SpO2.    Underwent RHC on 04/18/12 as below:  RA = 9  RV = 75/11/14  PA = 71/25 (44)  PCW = 4  Fick cardiac output/index = 3.0/1.7  Thermo CO/CI = 2.7/1.5  PVR = 23.5 [place]  FA sat = 79% on room air (checked 3 times). Sat with finger probe on air 94%  PA sat = 41%, 43%  MW 04/18/12 880 feet  ROV 06/24/13 -- f/u for scleroderma, Raynaud's, non-Hodgkins lymphoma (treated '10), HTN, PAH on IV therapy. 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 Aspirating).  She states that she has felt much better with good functional capacity, less dyspnea, no CP since she started prednisone. No overt aspiration sx although she is high risk. [facility]   ROV 07/24/13 -- scleroderma, Raynaud's, non-Hodgkins lymphoma (treated '10), HTN, PAH on IV therapy. Last time we initiated a pred taper, currently on 254m She has noticed a bit of lightheadedness. She saw Dr FoGilles Chiquitoarly July - her O2 was decreased to 4L/min w exertion. Her former maintenance pred dose was 45m1md.   ROV 08/27/13 -- scleroderma, Raynaud's, non-Hodgkins lymphoma (treated '10), HTN, PAH on IV therapy. We have been tapering pred, currently at 15m80md. She has been interested in getting off Veletri. We have planned to repeat R heart cath when we get to stable pred dosing. Breathing is stable. Continues to have occasional Reynaud's, not really increased freq.   ROV 10/08/13 -- scleroderma, Raynaud's, non-Hodgkins lymphoma (treated '10), HTN, PAH on IV therapy. We have been tapering pred, last time decreased to 45mg.29me appears to have tolerated this. No new issues with breathing, CP. Edema is stable. No syncope or pre-syncope.   ROV 01/06/14 -- scleroderma, Raynaud's, non-Hodgkins lymphoma (treated '10), HTN, PAH previously on IV therapy >> [facility] Current dose is 6 puffs qid. Also on bosentan and sildenifil. She has also been treated for a MRSA R leg ulcer with linezolid, planned for 10 days. She had to stop celexa when the linezolid caused QTc prolongation. She is on Pred 45mg, 61mble scleroderma dose. She saw Dr BeekmaAmil Amenlanning to follow with him.   Breathing is doing well. Doesn't feel that she lost much ground since she changed from IV therapy. No real edema. No syncope or presyncope. No HA's or visual changes.   ROV 01/04/15 -- follow up visit for scleroderma, secondary PAH. We had changed her Veletri to Tyvaso. She has since been taken off Tyvaso. She hasn't seemed to lose any ground off this. Remains on bosentan and tracleer. Also on chronic  prednisone 18m. She wonders about starting cellcept and getting off the prednisone. She also wonders about a substitute for bosentan.   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 She has been on bosentan and sildenafil; [facility]t notice any real change. We dropped her pred from 5 to 221mand she has noted some severe fatigue 2h before its time to take the next dose. We did the decrease because she has a poorly healing wound.  She does  not desaturate.        ROV 11/01/15 -- Follow-up visit for history of autoimmune disease and associated secondary pulmonary hypertension. She is on macetentan, slidenifil. She is on prednisone 24m50mShe was admited recently for symptomatic anemia, required transfusion. EGD and CSY were reassuring, received PRBC and staretd on Fe. She feels better, stronger. TTE per CHF clinic plans.   ROV 05/02/16 -- patient with a history of scleroderma on low-dose prednisone and secondary primary hypertension. Also with a history of non-Hodgkin's lymphoma that has been in remission since 2010. She had an echocardiogram on 05/01/16 and have reviewed the results. This showed a stable left ventricular ejection fraction, mild flattening of the intermittent radicular septum and a moderately dilated RV with decreased systolic function. Her PA systolic pressure was estimated at 75 mmHg. Her macitentan was continued sildenafil was changed to Adcirca 80m51mily (not yet available from the pharmacy).   She has been overall stable, although she has been dealing with the stress of her husband being sick. She tells me that she has increased her home O2 to 3L/min, is on 2L/min pulsed on POC. She remains on pred 2.24mg,3mcreased 6 months ago by Dr BeekmAmil Amene has been dealing with symptomatic anemia without evidence GIB   Recent Labs Lab 05/02/16 1203  HGB 9.8*  HCT 29.2*  WBC 13.0*  PLT 266.0      Review of Systems  Constitutional: Negative for fever, appetite change and unexpected weight change.  HENT: Negative for congestion, dental problem, ear pain, nosebleeds, postnasal drip, rhinorrhea, sinus pressure, sneezing, sore throat and trouble swallowing.   Eyes: Negative for redness and itching.  Respiratory: Positive for shortness of breath. Negative for cough, chest tightness and wheezing.   Cardiovascular: Negative for palpitations and leg swelling.  Gastrointestinal: Negative for nausea and vomiting.  Genitourinary:  Negative for dysuria.  Musculoskeletal: Negative for joint swelling.  Skin: Negative for rash.  Neurological: Negative for headaches.  Hematological: Does not bruise/bleed easily.  Psychiatric/Behavioral: Negative for dysphoric mood. The patient is not nervous/anxious.        Objective:   Physical Exam Filed Vitals:   05/02/16 1427  BP: 90/52  Pulse: 76  Height: _0  (1.727 m)  Weight: 138 lb (62.596 kg)  SpO2: 92%   Gen: Pleasant, thin, in no distress,  normal affect on pulsed nasal cannula O2  ENT: No lesions,  mouth clear,  oropharynx clear, no postnasal drip  Neck: No JVD, no TMG, no carotid bruits  Lungs: No use of accessory muscles, few bibasilar insp crackles  Cardiovascular: RRR, heart sounds normal, no murmur or gallops, 1+ peripheral edema  Musculoskeletal: No deformities, no cyanosis or clubbing  Neuro: alert, non focal  Skin: significant changes from scleroderma on arms, hands, fingers; right lower extremity wound is wrapped and dressed      Assessment & Plan:  Scleroderma, circumscribed or localized Please continue your prednisone 2.24mg d30my  for now.  Continue your oxygen at 2-3 L/min.  We will perform a CT scan of your chest to compare with your priors.   Pulmonary hypertension associated with systemic disorder ([place]) Start Adcirca in place of Revatio when the med is available. Continue your macitentan.  Continue your oxygen at 2-3 L/min.

## 2016-05-06 ENCOUNTER — Encounter (HOSPITAL_COMMUNITY): Payer: Self-pay | Admitting: *Deleted

## 2016-05-06 ENCOUNTER — Inpatient Hospital Stay (HOSPITAL_COMMUNITY)
Admission: EM | Admit: 2016-05-06 | Discharge: 2016-05-08 | DRG: 378 | Disposition: A | Discharge status: Home / Self Care | Payer: BLUE CROSS/BLUE SHIELD | Attending: Internal Medicine | Admitting: Internal Medicine

## 2016-05-06 DIAGNOSIS — F329 Major depressive disorder, single episode, unspecified: Secondary | ICD-10-CM | POA: Diagnosis present

## 2016-05-06 DIAGNOSIS — K920 Hematemesis: Principal | ICD-10-CM | POA: Diagnosis present

## 2016-05-06 DIAGNOSIS — N183 Chronic kidney disease, stage 3 (moderate): Secondary | ICD-10-CM | POA: Diagnosis present

## 2016-05-06 DIAGNOSIS — D62 Acute posthemorrhagic anemia: Secondary | ICD-10-CM | POA: Diagnosis not present

## 2016-05-06 DIAGNOSIS — Z85820 Personal history of malignant melanoma of skin: Secondary | ICD-10-CM | POA: Diagnosis not present

## 2016-05-06 DIAGNOSIS — Z7952 Long term (current) use of systemic steroids: Secondary | ICD-10-CM

## 2016-05-06 DIAGNOSIS — M349 Systemic sclerosis, unspecified: Secondary | ICD-10-CM | POA: Diagnosis present

## 2016-05-06 DIAGNOSIS — E876 Hypokalemia: Secondary | ICD-10-CM | POA: Diagnosis not present

## 2016-05-06 DIAGNOSIS — D509 Iron deficiency anemia, unspecified: Secondary | ICD-10-CM | POA: Diagnosis present

## 2016-05-06 DIAGNOSIS — Z7982 Long term (current) use of aspirin: Secondary | ICD-10-CM

## 2016-05-06 DIAGNOSIS — L940 Localized scleroderma [morphea]: Secondary | ICD-10-CM | POA: Diagnosis present

## 2016-05-06 DIAGNOSIS — E785 Hyperlipidemia, unspecified: Secondary | ICD-10-CM | POA: Diagnosis not present

## 2016-05-06 DIAGNOSIS — Z7902 Long term (current) use of antithrombotics/antiplatelets: Secondary | ICD-10-CM

## 2016-05-06 DIAGNOSIS — K922 Gastrointestinal hemorrhage, unspecified: Secondary | ICD-10-CM | POA: Diagnosis not present

## 2016-05-06 DIAGNOSIS — E870 Hyperosmolality and hypernatremia: Secondary | ICD-10-CM | POA: Diagnosis present

## 2016-05-06 DIAGNOSIS — E860 Dehydration: Secondary | ICD-10-CM | POA: Diagnosis present

## 2016-05-06 DIAGNOSIS — R0902 Hypoxemia: Secondary | ICD-10-CM

## 2016-05-06 DIAGNOSIS — I10 Essential (primary) hypertension: Secondary | ICD-10-CM | POA: Diagnosis present

## 2016-05-06 DIAGNOSIS — F32A Depression, unspecified: Secondary | ICD-10-CM | POA: Diagnosis present

## 2016-05-06 DIAGNOSIS — I739 Peripheral vascular disease, unspecified: Secondary | ICD-10-CM | POA: Diagnosis present

## 2016-05-06 DIAGNOSIS — I272 Other secondary pulmonary hypertension: Secondary | ICD-10-CM | POA: Diagnosis not present

## 2016-05-06 DIAGNOSIS — K210 Gastro-esophageal reflux disease with esophagitis: Secondary | ICD-10-CM | POA: Diagnosis present

## 2016-05-06 DIAGNOSIS — I779 Disorder of arteries and arterioles, unspecified: Secondary | ICD-10-CM

## 2016-05-06 DIAGNOSIS — I2729 Other secondary pulmonary hypertension: Secondary | ICD-10-CM | POA: Diagnosis present

## 2016-05-06 DIAGNOSIS — Z8572 Personal history of non-Hodgkin lymphomas: Secondary | ICD-10-CM

## 2016-05-06 HISTORY — DX: Anemia, unspecified: D64.9

## 2016-05-06 LAB — URINE MICROSCOPIC-ADD ON

## 2016-05-06 LAB — COMPREHENSIVE METABOLIC PANEL
ALT: 19 U/L (ref 14–54)
AST: 21 U/L (ref 15–41)
Albumin: 3.2 g/dL — ABNORMAL LOW (ref 3.5–5.0)
Alkaline Phosphatase: 96 U/L (ref 38–126)
Anion gap: 16 — ABNORMAL HIGH (ref 5–15)
BUN: 65 mg/dL — ABNORMAL HIGH (ref 6–20)
CO2: 30 mmol/L (ref 22–32)
Calcium: 9.6 mg/dL (ref 8.9–10.3)
Chloride: 93 mmol/L — ABNORMAL LOW (ref 101–111)
Creatinine, Ser: 1.5 mg/dL — ABNORMAL HIGH (ref 0.44–1.00)
GFR calc Af Amer: 41 mL/min — ABNORMAL LOW (ref >60)
GFR calc non Af Amer: 35 mL/min — ABNORMAL LOW (ref >60)
Glucose, Bld: 123 mg/dL — ABNORMAL HIGH (ref 65–99)
Potassium: 4 mmol/L (ref 3.5–5.1)
Sodium: 139 mmol/L (ref 135–145)
Total Bilirubin: 0.7 mg/dL (ref 0.3–1.2)
Total Protein: 7 g/dL (ref 6.5–8.1)

## 2016-05-06 LAB — URINALYSIS, ROUTINE W REFLEX MICROSCOPIC
Bilirubin Urine: NEGATIVE
Glucose, UA: NEGATIVE mg/dL
Hgb urine dipstick: NEGATIVE
Ketones, ur: NEGATIVE mg/dL
Nitrite: NEGATIVE
Protein, ur: NEGATIVE mg/dL
Specific Gravity, Urine: 1.014 (ref 1.005–1.030)
pH: 7 (ref 5.0–8.0)

## 2016-05-06 LAB — CBC
HCT: 27.6 % — ABNORMAL LOW (ref 36.0–46.0)
Hemoglobin: 8.8 g/dL — ABNORMAL LOW (ref 12.0–15.0)
MCH: 32.8 pg (ref 26.0–34.0)
MCHC: 31.9 g/dL (ref 30.0–36.0)
MCV: 103 fL — ABNORMAL HIGH (ref 78.0–100.0)
Platelets: 341 10*3/uL (ref 150–400)
RBC: 2.68 MIL/uL — ABNORMAL LOW (ref 3.87–5.11)
RDW: 14.4 % (ref 11.5–15.5)
WBC: 9.5 10*3/uL (ref 4.0–10.5)

## 2016-05-06 LAB — PHOSPHORUS: Phosphorus: 4.1 mg/dL (ref 2.5–4.6)

## 2016-05-06 LAB — LIPASE, BLOOD: Lipase: 87 U/L — ABNORMAL HIGH (ref 11–51)

## 2016-05-06 LAB — POC OCCULT BLOOD, ED: Fecal Occult Bld: POSITIVE — AB

## 2016-05-06 LAB — HEMOGLOBIN: Hemoglobin: 8.7 g/dL — ABNORMAL LOW (ref 12.0–15.0)

## 2016-05-06 LAB — OCCULT BLOOD GASTRIC / DUODENUM (SPECIMEN CUP): Occult Blood, Gastric: POSITIVE — AB

## 2016-05-06 LAB — MRSA PCR SCREENING: MRSA by PCR: POSITIVE — AB

## 2016-05-06 LAB — MAGNESIUM: Magnesium: 1.9 mg/dL (ref 1.7–2.4)

## 2016-05-06 LAB — HEMATOCRIT: HCT: 27.7 % — ABNORMAL LOW (ref 36.0–46.0)

## 2016-05-06 MED ORDER — ONDANSETRON HCL 4 MG PO TABS: 4.0000 mg | ORAL_TABLET | Freq: Four times a day (QID) | ORAL | Status: DC | PRN

## 2016-05-06 MED ORDER — SODIUM CHLORIDE 0.9 % IV SOLN
INTRAVENOUS | Status: DC
  Administered 2016-05-06 – 2016-05-07 (×2): via INTRAVENOUS

## 2016-05-06 MED ORDER — SODIUM CHLORIDE 0.9 % IV BOLUS (SEPSIS)
500.0000 mL | Freq: Once | INTRAVENOUS | Status: AC
  Administered 2016-05-06: 500 mL via INTRAVENOUS

## 2016-05-06 MED ORDER — SODIUM CHLORIDE 0.9 % IV BOLUS (SEPSIS)
1000.0000 mL | Freq: Once | INTRAVENOUS | Status: AC
  Administered 2016-05-06: 1000 mL via INTRAVENOUS

## 2016-05-06 MED ORDER — METOCLOPRAMIDE HCL 5 MG/ML IJ SOLN
10.0000 mg | Freq: Once | INTRAMUSCULAR | Status: AC
  Administered 2016-05-06: 10 mg via INTRAVENOUS
  Filled 2016-05-06: qty 2

## 2016-05-06 MED ORDER — PANTOPRAZOLE SODIUM 40 MG IV SOLR: 40.0000 mg | Freq: Two times a day (BID) | INTRAVENOUS | Status: DC

## 2016-05-06 MED ORDER — DIPHENHYDRAMINE HCL 50 MG/ML IJ SOLN
25.0000 mg | Freq: Four times a day (QID) | INTRAMUSCULAR | Status: DC | PRN
  Administered 2016-05-06: 25 mg via INTRAVENOUS
  Filled 2016-05-06: qty 1

## 2016-05-06 MED ORDER — PANTOPRAZOLE SODIUM 40 MG IV SOLR
80.0000 mg | Freq: Once | INTRAVENOUS | Status: AC
  Administered 2016-05-06: 80 mg via INTRAVENOUS
  Filled 2016-05-06: qty 80

## 2016-05-06 MED ORDER — ONDANSETRON HCL 4 MG/2ML IJ SOLN
4.0000 mg | Freq: Once | INTRAMUSCULAR | Status: DC
  Filled 2016-05-06: qty 2

## 2016-05-06 MED ORDER — SODIUM CHLORIDE 0.9 % IV SOLN
80.0000 mg | Freq: Once | INTRAVENOUS | Status: DC
  Filled 2016-05-06: qty 80

## 2016-05-06 MED ORDER — ONDANSETRON HCL 4 MG/2ML IJ SOLN: 4.0000 mg | Freq: Four times a day (QID) | INTRAMUSCULAR | Status: DC | PRN

## 2016-05-06 MED ORDER — SODIUM CHLORIDE 0.9 % IV SOLN
8.0000 mg/h | INTRAVENOUS | Status: DC
  Administered 2016-05-06 – 2016-05-08 (×4): 8 mg/h via INTRAVENOUS
  Filled 2016-05-06 (×10): qty 80

## 2016-05-06 NOTE — H&P (Signed)
History and Physical    [name] [name] FGH:[id number] DOB: September 04, 1951 DOA: 05/06/2016  Referring MD/NP/PA: Pattricia Boss, MD PCP: Hoyt Koch, MD  Outpatient Specialists: [name], MD (gastroenterology) Patient coming from: Home  Chief Complaint: Fatigue and vomiting.  HPI: [name] [name] is a 65 y.o. female with medical history significant of hypertension, scleroderma, non-Hodgkin lymphoma, melanoma, peripheral tibial occlusive disease, neuropathic foot ulcer, pulmonary hypertension, anemia is coming to the emergency department due to fatigue and vomiting.  Per patient, she has been a little bit more fatigue lately, but otherwise did not have any other symptoms. She otherwise was in her usual state of health last night, but states that she woke up feeling more fatigued this morning. She states that she had breakfast around 9 in the morning, but around 11:00 had an episode of emesis. She is states that later in the day she had a second episode which showed coffee-ground contents and had a third episode after this as well. She is states that she has melena, but she has been taking iron supplements. She denies using any over-the-counter NSAIDs, but she takes Aspirin and Plavix. She does not smoke, occasionally drinks coffee and has wine or a liquor drink on most evenings. She denies drinking more than a glass of wine or more than an ounce of liquor. She has had 2 more episodes of emesis in the emergency department.   She had bidirectional endoscopic studies last year in October, after she was admitted to the hospital for anemia, but no definite source of bleeding from the GI tract was identified.  ED Course: In the ER, the patient was in no acute distress. Workup reveals a hemoglobin level of 8.8 g/dL decreased from12.1 g/dL in March, 10.6 g/dL on April 18th and 9.8 g/dL4 days ago.  Review of Systems: As per HPI otherwise 10 point review of systems negative.  Past Medical History   Diagnosis Date  . History of chicken pox   . Hypertension   . 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 Non Hodgkin's lymphoma ([place])     Treated with chemo 2010, felt to be cured.  . Melanoma ([place]) 1999  . Peripheral arterial occlusive disease ([place])   . 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[place])   . DOE (dyspnea on exertion)   . PAH (pulmonary artery hypertension) ([place])   . Epistaxis   . Pulmonary hypertension ([place])   . Anemia     Past Surgical History  Procedure Laterality Date  . Rt little toe removal  10/1998  . Stent left thigh  3/11  . Tonsillectomy and adenoidectomy    . Small intestine surgery    . Biopsy on cerebellum    . Vein ligation and stripping  05/16/2012    Procedure: VEIN LIGATION AND STRIPPING;  Surgeon: [name], MD;  [facility] OR;  Service: Vascular;  Laterality: Right;  Irrigation and Debridement right lower leg, ligation of saphenous vein.  . Endovenous ablation saphenous vein w/ laser  07-24-2012    right greater saphenous vein by [name], M.D.   . Endovenous ablation saphenous vein w/ laser  10-16-2012    left greater saphenous vein  by Dr. [name]  . Apligraph  12-04-2102,   12-18-2012    bilateral calves   [name]  . Tunneled venous catheter placement  01/2013  . Right heart catheterization N/A 11/14/2012    Procedure: RIGHT HEART CATH;  Surgeon: [name], MD;  Location: [facility] C[facility]  LAB;  Service: Cardiovascular;  Laterality: N/A;  . Endovenous ablation saphenous vein w/ laser Right 09-01-2015    endovenous laser ablation right greater saphenous vein by [name] MD  . Esophagogastroduodenoscopy (egd) with propofol Left 10/07/2015    Procedure: ESOPHAGOGASTRODUODENOSCOPY (EGD) WITH PROPOFOL;  Surgeon: [name], MD;  Location: [place];  Service: Endoscopy;  Laterality: Left;  . Colonoscopy with propofol Left 10/07/2015    Procedure: COLONOSCOPY WITH PROPOFOL;  Surgeon: [name], MD;  Location: [place];  Service:  Endoscopy;  Laterality: Left;     reports that she has never smoked. She has never used smokeless tobacco. She reports that she drinks alcohol. She reports that she does not use illicit drugs.  Allergies  Allergen Reactions  . Sulfa Antibiotics     Severe rash with significant peeling of face. No airway involvement per patient  . Levaquin [Levofloxacin In D5w] Other (See Comments)    FLU LIKE SYMPTOM  . Penicillins Rash    Family History  Problem Relation Age of Onset  . Lupus Father   . Lymphoma Father   . Hyperlipidemia Mother   . Cancer Daughter     sarcoma  . Colon cancer      Prior to Admission medications   Medication Sig Start Date End Date Taking? Authorizing Provider  acidophilus (RISAQUAD) CAPS Take 1 capsule by mouth every morning.    Yes Historical Provider, MD  aspirin 81 MG tablet Take 1 tablet (81 mg total) by mouth every evening. Resume in 1 week. 10/15/15  Yes Eugenie Filler, MD  atorvastatin (LIPITOR) 10 MG tablet Take 1 tablet (10 mg total) by mouth daily. 03/09/16  Yes Hoyt Koch, MD  azithromycin (ZITHROMAX) 250 MG tablet Take 250 mg by mouth daily. 05/03/16  Yes Historical Provider, MD  BIOTIN PO Take 1 tablet by mouth daily.   Yes Historical Provider, MD  cholecalciferol (VITAMIN D) 1000 UNITS tablet Take 2,000 Units by mouth every evening.    Yes Historical Provider, MD  citalopram (CELEXA) 20 MG tablet Take 30 mg by mouth daily.   Yes Historical Provider, MD  clopidogrel (PLAVIX) 75 MG tablet take 1 tablet by mouth once daily 11/23/15  Yes Hoyt Koch, MD  fish oil-omega-3 fatty acids 1000 MG capsule Take 1 g by mouth daily with lunch.    Yes Historical Provider, MD  gabapentin (NEURONTIN) 300 MG capsule take 1 capsule by mouth every morning and 2 every evening Patient taking differently: Take 300-600 mg by mouth 2 (two) times daily. take 1 capsule by mouth every morning and 2 every evening 03/09/16  Yes Hoyt Koch, MD   Macitentan (OPSUMIT) 10 MG TABS Take 10 mg by mouth daily. 03/09/16  Yes Hoyt Koch, MD  moxifloxacin (VIGAMOX) 0.5 % ophthalmic solution Place 1 drop into both eyes 3 (three) times daily as needed (allergies).    Yes Historical Provider, MD  multivitamin [facility]) per tablet Take 1 tablet by mouth daily.    Yes Historical Provider, MD  OXYGEN Inhale 2 L into the lungs continuous.   Yes Historical Provider, MD  pantoprazole (PROTONIX) 40 MG tablet Take 1 tablet (40 mg total) by mouth daily. 03/09/16  Yes Hoyt Koch, MD  POLY-IRON 150 150 MG capsule take 1 capsule by mouth twice a day 04/30/16  Yes Hoyt Koch, MD  predniSONE (DELTASONE) 2.5 MG tablet Take 2.5 mg by mouth daily. 04/23/16  Yes Historical Provider, MD  sildenafil (REVATIO) 20 MG  tablet Take 40 mg by mouth 3 (three) times daily. 04/02/16  Yes Historical Provider, MD  spironolactone (ALDACTONE) 25 MG tablet Take 1 tablet (25 mg total) by mouth daily. 03/09/16  Yes Hoyt Koch, MD  torsemide (DEMADEX) 20 MG tablet Take 2 tablets (40 mg total) by mouth 2 (two) times daily. 03/09/16  Yes Hoyt Koch, MD  vitamin B-12 (CYANOCOBALAMIN) 500 MCG tablet Take 500 mcg by mouth every morning.    Yes Historical Provider, MD  potassium chloride (K-DUR) 10 MEQ tablet Take 1 tablet (10 mEq total) by mouth daily. 03/09/16   Hoyt Koch, MD  predniSONE (DELTASONE) 5 MG tablet take 1 tablet by mouth once daily WITH BREAKFAST Patient taking differently: Take 2.5 mg by mouth. take 1 tablet by mouth once daily WITH BREAKFAST 03/09/16   Hoyt Koch, MD  Tadalafil, PAH, (ADCIRCA) 20 MG TABS Take 2 tablets (40 mg total) by mouth daily. 05/01/16   [name], MD    Physical Exam: Filed Vitals:   05/06/16 1445 05/06/16 1705 05/06/16 1715 05/06/16 1721  BP: 115/47  125/50   Pulse: 82  74 66  Temp: 98.1 F (36.7 C)     TempSrc: Oral     Resp: 18   16  SpO2:  100% 100% 100%       Constitutional: Looks acutely ill, but in no acute distress at the moment. Filed Vitals:   05/06/16 1445 05/06/16 1705 05/06/16 1715 05/06/16 1721  BP: 115/47  125/50   Pulse: 82  74 66  Temp: 98.1 F (36.7 C)     TempSrc: Oral     Resp: 18   16  SpO2:  100% 100% 100%   Eyes: PERRL, lids and conjunctivae normal ENMT: Mucous membranes are dry. Posterior pharynx clear of any exudate or lesions.Normal dentition.  Neck: normal, supple, no masses, no thyromegaly Respiratory: clear to auscultation bilaterally, no wheezing, no crackles. Normal respiratory effort. No accessory muscle use.  Cardiovascular: Regular rate and rhythm, no murmurs / rubs / gallops. No extremity edema. 2+ pedal pulses. No carotid bruits.  Abdomen: no tenderness, no masses palpated. No hepatosplenomegaly. Bowel sounds positive.  Rectal done earlier by Dr. Jeanell Sparrow: Black stools on rectal exam with positive guaiac. Musculoskeletal: no clubbing / cyanosis. No joint deformity upper and lower extremities. Good ROM, no contractures. Normal muscle tone.  Skin: Positive skin thickening and tightening, particularly the patient shows the deformities on hands and fingers.          Positive in dressing with Ace wrap on right lower extremity.  Neurologic: CN 2-12 grossly intact. Sensation intact, DTR normal. Strength 5/5 in all 4.  Psychiatric: Normal judgment and insight. Alert and oriented x 4. Normal mood.    Labs on Admission: I have personally reviewed following labs and imaging studies  CBC:  Recent Labs Lab 05/02/16 1203 05/06/16 1455  WBC 13.0* 9.5  HGB 9.8* 8.8*  HCT 29.2* 27.6*  MCV 97.3 103.0*  PLT 266.0 885   Basic Metabolic Panel:  Recent Labs Lab 05/06/16 1455  NA 139  K 4.0  CL 93*  CO2 30  GLUCOSE 123*  BUN 65*  CREATININE 1.50*  CALCIUM 9.6   GFR: Estimated Creatinine Clearance: 36.5 mL/min (by C-G formula based on Cr of 1.5). Liver Function Tests:  Recent Labs Lab 05/06/16 1455   AST 21  ALT 19  ALKPHOS 96  BILITOT 0.7  PROT 7.0  ALBUMIN 3.2*    Recent Labs  Lab 05/06/16 1455  LIPASE 87*    Recent Labs  09/06/15 1221  PROBNP 305.0*   Urine analysis:    Component Value Date/Time   BILIRUBINUR n 08/12/2012 1420   PROTEINUR n 08/12/2012 1420   UROBILINOGEN 0.2 08/12/2012 1420   NITRITE n 08/12/2012 1420   LEUKOCYTESUR Negative 08/12/2012 1420    EKG: Independently reviewed. Vent. rate 66 BPM PR interval 160 ms QRS duration 101 ms QT/QTc 563/590 ms P-R-T axes 77 99 -1 Sinus rhythm Probable left atrial enlargement Anteroseptal infarct, age indeterminate Prolonged QT interval  Assessment/Plan Principal Problem:   UGI bleed Admit to stepdown/inpatient. Keep nothing by mouth. Continue IV fluids. Start Pantoprazole infusion Monitor H&H closely and transfuse if needed. Gastroenterology has been contacted and will probably evaluate in a.m. The patient has a history of peripheral arterial disease and has been taking clopidogrel and aspirin.  Active Problems:   Anemia, iron deficiency Secondary to GI losses. Continue treatment as above. Monitor H&H    Scleroderma, circumscribed or localized Stable. Resume low-dose glucocorticoid once the patient is tolerating oral intake.    Essential hypertension Stable. Will monitor closely. Low-dose IV antihypertensives as needed, if the blood pressure rises.    Hyperlipidemia Hold atorvastatin for now.    Pulmonary hypertension associated with systemic disorder (HCC) Resume sildenafil once the patient is tolerating oral intake.      Depression Resume Celexa once the patient is tolerating oral intake.       DVT prophylaxis: SCDs. Code Status: Full code Family Communication:  Disposition Plan: Admit to stepdown for close monitoring and GI evaluation. Consults called: Gastroenterology (Dr. Watt Climes) Admission status: Stepdown/inpatient   Reubin Milan MD [facility] Pager  [phone].  If 7PM-7AM, please contact night-coverage [email or web address] Password [email or web address]  05/06/2016, 6:50 PM

## 2016-05-06 NOTE — ED Provider Notes (Signed)
CSN: [id number]     Arrival date & time 05/06/16  1432 History   First MD Initiated Contact with Patient 05/06/16 1639     Chief Complaint  Patient presents with  . Fatigue  . Emesis     (Consider location/radiation/quality/duration/timing/severity/associated sxs/prior Treatment) HPI 65 year old female history of scleroderma, non-Hodgkin's lymphoma, melanoma, peripheral arterial occlusive disease presents today complaining of generalized fatigue and weakness with vomiting.  She states that she has been weak for several days. She has had some chills but no fever. Today she has vomited 3 times. The second episode was a copious amount of coffee-ground emesis. She has not seen any bright red blood. She denies any previous history of coffee-ground emesis. She feels that this is consistent with her iron intake. She was admitted in October for anemia that was thought to be secondary to GI loss but no definite source was ever found. She denies any headache, neck pain, chest pain, dyspnea, abdominal pain, urinary tract infection symptoms, melena or bright red blood per rectum. Her stools are always dark secondary to the iron intake. Past Medical History  Diagnosis Date  . History of chicken pox   . Hypertension   . 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 Non Hodgkin's lymphoma ([place])     Treated with chemo 2010, felt to be cured.  . Melanoma ([place]) 1999  . Peripheral arterial occlusive disease ([place])   . 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[place])   . DOE (dyspnea on exertion)   . PAH (pulmonary artery hypertension) ([place])   . Epistaxis   . Pulmonary hypertension ([place])   . Anemia    Past Surgical History  Procedure Laterality Date  . Rt little toe removal  10/1998  . Stent left thigh  3/11  . Tonsillectomy and adenoidectomy    . Small intestine surgery    . Biopsy on cerebellum    . Vein ligation and stripping  05/16/2012    Procedure: VEIN LIGATION AND STRIPPING;  Surgeon: [name], MD;  Location: [facility] OR;   Service: Vascular;  Laterality: Right;  Irrigation and Debridement right lower leg, ligation of saphenous vein.  . Endovenous ablation saphenous vein w/ laser  07-24-2012    right greater saphenous vein by [name], M.D.   . Endovenous ablation saphenous vein w/ laser  10-16-2012    left greater saphenous vein  by Dr. [name]  . Apligraph  12-04-2102,   12-18-2012    bilateral calves   [name]  . Tunneled venous catheter placement  01/2013  . Right heart catheterization N/A 11/14/2012    Procedure: RIGHT HEART CATH;  Surgeon: [name], MD;  Location: [facility] CATH LAB;  Service: Cardiovascular;  Laterality: N/A;  . Endovenous ablation saphenous vein w/ laser Right 09-01-2015    endovenous laser ablation right greater saphenous vein by [name] MD  . Esophagogastroduodenoscopy (egd) with propofol Left 10/07/2015    Procedure: ESOPHAGOGASTRODUODENOSCOPY (EGD) WITH PROPOFOL;  Surgeon: [name], MD;  Location: [place];  Service: Endoscopy;  Laterality: Left;  . Colonoscopy with propofol Left 10/07/2015    Procedure: COLONOSCOPY WITH PROPOFOL;  Surgeon: [name], MD;  Location: [place];  Service: Endoscopy;  Laterality: Left;   Family History  Problem Relation Age of Onset  . Lupus Father   . Lymphoma Father   . Hyperlipidemia Mother   . Cancer Daughter     sarcoma  . Colon cancer     Social History  Substance Use Topics  .  Smoking status: Never Smoker   . Smokeless tobacco: Never Used  . Alcohol Use: 0.0 oz/week    0 Standard drinks or equivalent per week     Comment: Occasional   OB History    No data available     Review of Systems  All other systems reviewed and are negative.     Allergies  Sulfa antibiotics; Levaquin; and Penicillins  Home Medications   Prior to Admission medications   Medication Sig Start Date End Date Taking? Authorizing Provider  acidophilus (RISAQUAD) CAPS Take 1 capsule by mouth every morning.      Historical Provider, MD  aspirin 81 MG tablet Take 1 tablet (81 mg total) by mouth every evening. Resume in 1 week. 10/15/15   Eugenie Filler, MD  atorvastatin (LIPITOR) 10 MG tablet Take 1 tablet (10 mg total) by mouth daily. 03/09/16   Hoyt Koch, MD  BIOTIN PO Take 1 tablet by mouth daily.    Historical Provider, MD  cholecalciferol (VITAMIN D) 1000 UNITS tablet Take 2,000 Units by mouth every evening.     Historical Provider, MD  citalopram (CELEXA) 20 MG tablet Take 30 mg by mouth daily.    Historical Provider, MD  clopidogrel (PLAVIX) 75 MG tablet take 1 tablet by mouth once daily 11/23/15   Hoyt Koch, MD  fish oil-omega-3 fatty acids 1000 MG capsule Take 1 g by mouth daily with lunch.     Historical Provider, MD  gabapentin (NEURONTIN) 300 MG capsule take 1 capsule by mouth every morning and 2 every evening 03/09/16   Hoyt Koch, MD  Macitentan (OPSUMIT) 10 MG TABS Take 10 mg by mouth daily. 03/09/16   Hoyt Koch, MD  moxifloxacin (VIGAMOX) 0.5 % ophthalmic solution Place 1 drop into both eyes 3 (three) times daily as needed (allergies).     Historical Provider, MD  multivitamin [facility]) per tablet Take 2 tablets by mouth 2 (two) times daily.     Historical Provider, MD  OXYGEN Inhale 2 L into the lungs continuous.    Historical Provider, MD  pantoprazole (PROTONIX) 40 MG tablet Take 1 tablet (40 mg total) by mouth daily. 03/09/16   Hoyt Koch, MD  POLY-IRON 150 150 MG capsule take 1 capsule by mouth twice a day 04/30/16   Hoyt Koch, MD  potassium chloride (K-DUR) 10 MEQ tablet Take 1 tablet (10 mEq total) by mouth daily. 03/09/16   Hoyt Koch, MD  predniSONE (DELTASONE) 5 MG tablet take 1 tablet by mouth once daily WITH BREAKFAST Patient taking differently: Take 2.5 mg by mouth. take 1 tablet by mouth once daily WITH BREAKFAST 03/09/16   Hoyt Koch, MD  spironolactone (ALDACTONE) 25 MG tablet Take 1 tablet (25  mg total) by mouth daily. 03/09/16   Hoyt Koch, MD  Tadalafil, PAH, (ADCIRCA) 20 MG TABS Take 2 tablets (40 mg total) by mouth daily. 05/01/16   [name], MD  torsemide (DEMADEX) 20 MG tablet Take 2 tablets (40 mg total) by mouth 2 (two) times daily. 03/09/16   Hoyt Koch, MD  vitamin B-12 (CYANOCOBALAMIN) 500 MCG tablet Take 500 mcg by mouth every morning.     Historical Provider, MD   BP 115/47 mmHg  Pulse 82  Temp(Src) 98.1 F (36.7 C) (Oral)  Resp 18  SpO2 96% Physical Exam  Constitutional: She is oriented to person, place, and time. She appears well-developed and well-nourished. No distress.  HENT:  Head:  Normocephalic and atraumatic.  Right Ear: External ear normal.  Left Ear: External ear normal.  Nose: Nose normal.  Mouth/Throat: Oropharynx is clear and moist.  Eyes: EOM are normal. Pupils are equal, round, and reactive to light.  Conjunctiva pale  Neck: Normal range of motion. Neck supple. No JVD present. No tracheal deviation present. No thyromegaly present.  Cardiovascular: Normal rate, regular rhythm and normal heart sounds.   Pulmonary/Chest: Effort normal and breath sounds normal. No respiratory distress. She has no wheezes.  Abdominal: Soft. Bowel sounds are normal. She exhibits no distension.  Genitourinary: Guaiac positive stool.  Black stool on rectal exam  Musculoskeletal: Normal range of motion. She exhibits no edema.  Neurological: She is alert and oriented to person, place, and time.  Skin: Skin is warm and dry.  Psychiatric: She has a normal mood and affect. Her behavior is normal. Judgment and thought content normal.  Nursing note and vitals reviewed.   ED Course  Procedures (including critical care time) Labs Review Labs Reviewed  LIPASE, BLOOD - Abnormal; Notable for the following:    Lipase 87 (*)    All other components within normal limits  COMPREHENSIVE METABOLIC PANEL - Abnormal; Notable for the following:    Chloride  93 (*)    Glucose, Bld 123 (*)    BUN 65 (*)    Creatinine, Ser 1.50 (*)    Albumin 3.2 (*)    GFR calc non Af Amer 35 (*)    GFR calc Af Amer 41 (*)    Anion gap 16 (*)    All other components within normal limits  CBC - Abnormal; Notable for the following:    RBC 2.68 (*)    Hemoglobin 8.8 (*)    HCT 27.6 (*)    MCV 103.0 (*)    All other components within normal limits  URINALYSIS, ROUTINE W REFLEX MICROSCOPIC (NOT AT [facility])    Imaging Review No results found. I have personally reviewed and evaluated these images and lab results as part of my medical decision-making.   EKG Interpretation   Date/Time:  Sunday May 06 2016 17:22:36 EDT Ventricular Rate:  66 PR Interval:  160 QRS Duration: 101 QT Interval:  563 QTC Calculation: 590 R Axis:   99 Text Interpretation:  Sinus rhythm Probable left atrial enlargement  Anteroseptal infarct, age indeterminate Prolonged QT interval Confirmed by  Landis Cassaro MD, Andee Poles [phone]) on 05/06/2016 6:14:18 PM      MDM   Final diagnoses:  Gastrointestinal hemorrhage, unspecified gastritis, unspecified gastrointestinal hemorrhage type    65 year old female history of scleroderma, history of anemia presents today with coffee-ground emesis. Hemoglobin here is decreased at 8.8. Blood pressure has been stable systolically 7:16 to 967 with normal heart rate. Hemoccult is positive. She has been typed and crossed. She has received Protonix. She did vomit here in the waiting room but has not had a repeat episodes of vomiting. Patient appears very volume depleted with BUN 65 and creatinine 1.5. She is receiving IV fluids here in the emergency department with 1500 mL ordered.  Discussed with Dr. Olevia Bowens and plan admission to stepdown unit. Discussed with Dr. Watt Climes, on-call for either gastroenterology, he requests that she be made nothing by mouth after midnight and [facility] team will see her in consult tomorrow  Pattricia Boss, MD 05/06/16 [phone]

## 2016-05-06 NOTE — ED Notes (Signed)
Pt arrived by [facility], woke up this am with increase in fatigue and weakness, vomiting this am. Recently had blood drawn and was told her hgb was low.

## 2016-05-07 ENCOUNTER — Encounter (HOSPITAL_COMMUNITY): Payer: Self-pay

## 2016-05-07 ENCOUNTER — Inpatient Hospital Stay (HOSPITAL_COMMUNITY): Payer: BLUE CROSS/BLUE SHIELD | Admitting: Certified Registered Nurse Anesthetist

## 2016-05-07 ENCOUNTER — Encounter (HOSPITAL_COMMUNITY)
Admission: EM | Disposition: A | Discharge status: Home / Self Care | Payer: Self-pay | Source: Home / Self Care | Attending: Internal Medicine

## 2016-05-07 ENCOUNTER — Other Ambulatory Visit: Payer: Self-pay | Admitting: Family

## 2016-05-07 DIAGNOSIS — E785 Hyperlipidemia, unspecified: Secondary | ICD-10-CM

## 2016-05-07 DIAGNOSIS — D62 Acute posthemorrhagic anemia: Secondary | ICD-10-CM

## 2016-05-07 DIAGNOSIS — E870 Hyperosmolality and hypernatremia: Secondary | ICD-10-CM

## 2016-05-07 HISTORY — PX: ESOPHAGOGASTRODUODENOSCOPY: SHX5428

## 2016-05-07 LAB — COMPREHENSIVE METABOLIC PANEL
ALT: 15 U/L (ref 14–54)
AST: 16 U/L (ref 15–41)
Albumin: 2.6 g/dL — ABNORMAL LOW (ref 3.5–5.0)
Alkaline Phosphatase: 66 U/L (ref 38–126)
Anion gap: 11 (ref 5–15)
BUN: 58 mg/dL — ABNORMAL HIGH (ref 6–20)
CO2: 24 mmol/L (ref 22–32)
Calcium: 8.1 mg/dL — ABNORMAL LOW (ref 8.9–10.3)
Chloride: 111 mmol/L (ref 101–111)
Creatinine, Ser: 1.11 mg/dL — ABNORMAL HIGH (ref 0.44–1.00)
GFR calc Af Amer: 59 mL/min — ABNORMAL LOW (ref >60)
GFR calc non Af Amer: 51 mL/min — ABNORMAL LOW (ref >60)
Glucose, Bld: 102 mg/dL — ABNORMAL HIGH (ref 65–99)
Potassium: 3.7 mmol/L (ref 3.5–5.1)
Sodium: 146 mmol/L — ABNORMAL HIGH (ref 135–145)
Total Bilirubin: 0.5 mg/dL (ref 0.3–1.2)
Total Protein: 5.4 g/dL — ABNORMAL LOW (ref 6.5–8.1)

## 2016-05-07 LAB — CBC WITH DIFFERENTIAL/PLATELET
Basophils Absolute: 0 10*3/uL (ref 0.0–0.1)
Basophils Relative: 0 %
Eosinophils Absolute: 0 10*3/uL (ref 0.0–0.7)
Eosinophils Relative: 0 %
HCT: 22 % — ABNORMAL LOW (ref 36.0–46.0)
Hemoglobin: 6.8 g/dL — CL (ref 12.0–15.0)
Lymphocytes Relative: 20 %
Lymphs Abs: 0.9 10*3/uL (ref 0.7–4.0)
MCH: 31.8 pg (ref 26.0–34.0)
MCHC: 30.9 g/dL (ref 30.0–36.0)
MCV: 102.8 fL — ABNORMAL HIGH (ref 78.0–100.0)
Monocytes Absolute: 0.6 10*3/uL (ref 0.1–1.0)
Monocytes Relative: 13 %
Neutro Abs: 3 10*3/uL (ref 1.7–7.7)
Neutrophils Relative %: 67 %
Platelets: 285 10*3/uL (ref 150–400)
RBC: 2.14 MIL/uL — ABNORMAL LOW (ref 3.87–5.11)
RDW: 14.7 % (ref 11.5–15.5)
WBC: 4.4 10*3/uL (ref 4.0–10.5)

## 2016-05-07 LAB — PREPARE RBC (CROSSMATCH)

## 2016-05-07 LAB — HEMOGLOBIN AND HEMATOCRIT, BLOOD
HCT: 25.5 % — ABNORMAL LOW (ref 36.0–46.0)
Hemoglobin: 7.9 g/dL — ABNORMAL LOW (ref 12.0–15.0)

## 2016-05-07 SURGERY — EGD (ESOPHAGOGASTRODUODENOSCOPY)
Anesthesia: Monitor Anesthesia Care

## 2016-05-07 MED ORDER — PROPOFOL 500 MG/50ML IV EMUL
INTRAVENOUS | Status: DC | PRN
  Administered 2016-05-07: 75 ug/kg/min via INTRAVENOUS

## 2016-05-07 MED ORDER — SODIUM CHLORIDE 0.9 % IV SOLN
Freq: Once | INTRAVENOUS | Status: AC
  Administered 2016-05-07: 08:00:00 via INTRAVENOUS

## 2016-05-07 MED ORDER — POLYETHYLENE GLYCOL 3350 17 G PO PACK
17.0000 g | PACK | Freq: Two times a day (BID) | ORAL | Status: DC
  Administered 2016-05-08: 17 g via ORAL
  Filled 2016-05-07 (×2): qty 1

## 2016-05-07 MED ORDER — PROPOFOL 10 MG/ML IV BOLUS
INTRAVENOUS | Status: DC | PRN
  Administered 2016-05-07 (×2): 50 mg via INTRAVENOUS

## 2016-05-07 MED ORDER — SODIUM CHLORIDE 0.9 % IV SOLN: Freq: Once | INTRAVENOUS | Status: AC

## 2016-05-07 MED ORDER — SODIUM CHLORIDE 0.9 % IV SOLN: INTRAVENOUS | Status: DC

## 2016-05-07 MED ORDER — BUTAMBEN-TETRACAINE-BENZOCAINE 2-2-14 % EX AERO
INHALATION_SPRAY | CUTANEOUS | Status: DC | PRN
Start: 2016-05-07 — End: 2016-05-07
  Administered 2016-05-07: 2 via TOPICAL

## 2016-05-07 NOTE — Progress Notes (Signed)
CRITICAL VALUE ALERT  Critical value received:  Hemoglobin 6.8  Date of notification:  05/07/2016  Time of notification:  0752  Critical value read back:Yes.    Nurse who received alert:  Fara Chute   MD notified (1st page):  Charlies Silvers  Time of first page:  [phone]  MD notified (2nd page):  Time of second page:  Responding MD:    Time MD responded:

## 2016-05-07 NOTE — H&P (View-Only) (Signed)
[facility] CON[facility] Reason for consult: hematemesis drop in hemoglobin Referring Physician: [facility] hospitalist. PCP: Dr. Elizabeth Crawford  [name] [name] is an 65 y.o. female.  HPI: I saw her in consultation 10/16 for anemia. She had EGD and colonoscopy at that time. Her EGD showed esophagitis with cytology brushing is consistent with fungal esophagitis and she was discharged on PPI therapy and Diflucan. 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 Colonoscopy was performed as well. The prep was quite good and there was no significant diverticulosis or hemorrhoids. A 1 cm adenoma was removed. I saw her back in the office subsequent to that hospitalization in her hemoglobin had risen to 11.1. It had risen to 12.1 as an outpatient as recently as 3/17 and has been slowly declining since that time. She is on chronic iron therapy in her stools are always dark. She takes aspirin Plavix. Her husband has been in the hospital and returned home last week. She was feeling reasonably well several weeks ago but started to feel tired and fatigued and just did not feel good starting last week but initially attributed this distress over her husband's illness. She had marked fatigue and had some vomiting causing her to come to the emergency room. Her hemoglobin was checked and was down to 8.8. Her stools were positive for blood and she apparently vomited up some material that was tested and was positive for blood. She denies abdominal pain, any nausea prior to the past 2 or 3 days. She has multiple medical problems including scleroderma and a history of non-Hodgkin's lymphoma treated about 5 or 6 years ago. She has peripheral arterial disease with problems with foot ulcers. She has primary pulmonary hypertension. She is followed by Dr. Byrum. She has had previous surgeries that included multiple vascular procedures as well as lysis adhesions in  some type of small intestinal surgery in the distant past   Past Medical History  Diagnosis Date  . History of chicken pox   . Hypertension   . Sclerodermia generalized (HCC)   . Non Hodgkin's lymphoma (HCC)     Treated with chemo 2010, felt to be cured.  . Melanoma (HCC) 1999  . Peripheral arterial occlusive disease (HCC)   . 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HCC)   . DOE (dyspnea on exertion)   . PAH (pulmonary artery hypertension) (HCC)   . Epistaxis   . Pulmonary hypertension (HCC)   . Anemia     Past Surgical History  Procedure Laterality Date  . Rt little toe removal  10/1998  . Stent left thigh  3/11  . Tonsillectomy and adenoidectomy    . Small intestine surgery    . Biopsy on cerebellum    . Vein ligation and stripping  05/16/2012    Procedure: VEIN LIGATION AND STRIPPING;  Surgeon: Todd F Early, MD;  Location: [facility] OR;  Service: Vascular;  Laterality: Right;  Irrigation and Debridement right lower leg, ligation of saphenous vein.  . Endovenous ablation saphenous vein w/ laser  07-24-2012    right greater saphenous vein by Todd Early, M.D.   . Endovenous ablation saphenous vein w/ laser  10-16-2012    left greater saphenous vein  by Dr. Early  . Apligraph  12-04-2102,   12-18-2012    bilateral calves   [facility]  . Tunneled venous catheter placement  01/2013  . Right heart catheterization N/A 11/14/2012    Procedure: RIGHT   HEART CATH;  Surgeon: Daniel R Bensimhon, MD;  Location: [facility] CATH LAB;  Service: Cardiovascular;  Laterality: N/A;  . Endovenous ablation saphenous vein w/ laser Right 09-01-2015    endovenous laser ablation right greater saphenous vein by Todd Early MD  . Esophagogastroduodenoscopy (egd) with propofol Left 10/07/2015    Procedure: ESOPHAGOGASTRODUODENOSCOPY (EGD) WITH PROPOFOL;  Surgeon: Tamla Winkels, MD;  Location: [facility] ENDOSCOPY;  Service: Endoscopy;  Laterality: Left;  . Colonoscopy with propofol Left 10/07/2015    Procedure: COLONOSCOPY  WITH PROPOFOL;  Surgeon: Caretha Rumbaugh, MD;  Location: [facility] ENDOSCOPY;  Service: Endoscopy;  Laterality: Left;    Family History  Problem Relation Age of Onset  . Lupus Father   . Lymphoma Father   . Hyperlipidemia Mother   . Cancer Daughter     sarcoma  . Colon cancer      Social History:  reports that she has never smoked. She has never used smokeless tobacco. She reports that she drinks alcohol. She reports that she does not use illicit drugs.  Allergies:  Allergies  Allergen Reactions  . Sulfa Antibiotics     Severe rash with significant peeling of face. No airway involvement per patient  . Levaquin [Levofloxacin In D5w] Other (See Comments)    FLU LIKE SYMPTOM  . Penicillins Rash    Medications; Prior to Admission medications   Medication Sig Start Date End Date Taking? Authorizing Provider  acidophilus (RISAQUAD) CAPS Take 1 capsule by mouth every morning.    Yes Historical Provider, MD  aspirin 81 MG tablet Take 1 tablet (81 mg total) by mouth every evening. Resume in 1 week. 10/15/15  Yes Daniel Thompson V, MD  atorvastatin (LIPITOR) 10 MG tablet Take 1 tablet (10 mg total) by mouth daily. 03/09/16  Yes Elizabeth A Crawford, MD  azithromycin (ZITHROMAX) 250 MG tablet Take 250 mg by mouth daily. 05/03/16  Yes Historical Provider, MD  BIOTIN PO Take 1 tablet by mouth daily.   Yes Historical Provider, MD  cholecalciferol (VITAMIN D) 1000 UNITS tablet Take 2,000 Units by mouth every evening.    Yes Historical Provider, MD  citalopram (CELEXA) 20 MG tablet Take 30 mg by mouth daily.   Yes Historical Provider, MD  clopidogrel (PLAVIX) 75 MG tablet take 1 tablet by mouth once daily 11/23/15  Yes Elizabeth A Crawford, MD  fish oil-omega-3 fatty acids 1000 MG capsule Take 1 g by mouth daily with lunch.    Yes Historical Provider, MD  gabapentin (NEURONTIN) 300 MG capsule take 1 capsule by mouth every morning and 2 every evening Patient taking differently: Take 300-600 mg by mouth 2  (two) times daily. take 1 capsule by mouth every morning and 2 every evening 03/09/16  Yes Elizabeth A Crawford, MD  Macitentan (OPSUMIT) 10 MG TABS Take 10 mg by mouth daily. 03/09/16  Yes Elizabeth A Crawford, MD  moxifloxacin (VIGAMOX) 0.5 % ophthalmic solution Place 1 drop into both eyes 3 (three) times daily as needed (allergies).    Yes Historical Provider, MD  multivitamin (THERAGRAN) per tablet Take 1 tablet by mouth daily.    Yes Historical Provider, MD  OXYGEN Inhale 2 L into the lungs continuous.   Yes Historical Provider, MD  pantoprazole (PROTONIX) 40 MG tablet Take 1 tablet (40 mg total) by mouth daily. 03/09/16  Yes Elizabeth A Crawford, MD  POLY-IRON 150 150 MG capsule take 1 capsule by mouth twice a day 04/30/16  Yes Elizabeth A Crawford, MD  predniSONE (DELTASONE) 2.5 MG tablet   Take 2.5 mg by mouth daily. 04/23/16  Yes Historical Provider, MD  sildenafil (REVATIO) 20 MG tablet Take 40 mg by mouth 3 (three) times daily. 04/02/16  Yes Historical Provider, MD  spironolactone (ALDACTONE) 25 MG tablet Take 1 tablet (25 mg total) by mouth daily. 03/09/16  Yes Elizabeth A Crawford, MD  torsemide (DEMADEX) 20 MG tablet Take 2 tablets (40 mg total) by mouth 2 (two) times daily. 03/09/16  Yes Elizabeth A Crawford, MD  vitamin B-12 (CYANOCOBALAMIN) 500 MCG tablet Take 500 mcg by mouth every morning.    Yes Historical Provider, MD  potassium chloride (K-DUR) 10 MEQ tablet Take 1 tablet (10 mEq total) by mouth daily. 03/09/16   Elizabeth A Crawford, MD  predniSONE (DELTASONE) 5 MG tablet take 1 tablet by mouth once daily WITH BREAKFAST Patient taking differently: Take 2.5 mg by mouth. take 1 tablet by mouth once daily WITH BREAKFAST 03/09/16   Elizabeth A Crawford, MD  Tadalafil, PAH, (ADCIRCA) 20 MG TABS Take 2 tablets (40 mg total) by mouth daily. 05/01/16   Daniel R Bensimhon, MD   . [START ON 05/10/2016] pantoprazole (PROTONIX) IV  40 mg Intravenous Q12H   PRN Meds diphenhydrAMINE, ondansetron **OR**  ondansetron (ZOFRAN) IV Results for orders placed or performed during the hospital encounter of 05/06/16 (from the past 48 hour(s))  Lipase, blood     Status: Abnormal   Collection Time: 05/06/16  2:55 PM  Result Value Ref Range   Lipase 87 (H) 11 - 51 U/L  Comprehensive metabolic panel     Status: Abnormal   Collection Time: 05/06/16  2:55 PM  Result Value Ref Range   Sodium 139 135 - 145 mmol/L   Potassium 4.0 3.5 - 5.1 mmol/L   Chloride 93 (L) 101 - 111 mmol/L   CO2 30 22 - 32 mmol/L   Glucose, Bld 123 (H) 65 - 99 mg/dL   BUN 65 (H) 6 - 20 mg/dL   Creatinine, Ser 1.50 (H) 0.44 - 1.00 mg/dL   Calcium 9.6 8.9 - 10.3 mg/dL   Total Protein 7.0 6.5 - 8.1 g/dL   Albumin 3.2 (L) 3.5 - 5.0 g/dL   AST 21 15 - 41 U/L   ALT 19 14 - 54 U/L   Alkaline Phosphatase 96 38 - 126 U/L   Total Bilirubin 0.7 0.3 - 1.2 mg/dL   GFR calc non Af Amer 35 (L) >60 mL/min   GFR calc Af Amer 41 (L) >60 mL/min    Comment: (NOTE) The eGFR has been calculated using the CKD EPI equation. This calculation has not been validated in all clinical situations. eGFR's persistently <60 mL/min signify possible Chronic Kidney Disease.    Anion gap 16 (H) 5 - 15  CBC     Status: Abnormal   Collection Time: 05/06/16  2:55 PM  Result Value Ref Range   WBC 9.5 4.0 - 10.5 K/uL   RBC 2.68 (L) 3.87 - 5.11 MIL/uL   Hemoglobin 8.8 (L) 12.0 - 15.0 g/dL   HCT 27.6 (L) 36.0 - 46.0 %   MCV 103.0 (H) 78.0 - 100.0 fL   MCH 32.8 26.0 - 34.0 pg   MCHC 31.9 30.0 - 36.0 g/dL   RDW 14.4 11.5 - 15.5 %   Platelets 341 150 - 400 K/uL  Occult bld gastric/duodenum (cup to lab)     Status: Abnormal   Collection Time: 05/06/16  5:10 PM  Result Value Ref Range   Occult Blood, Gastric POSITIVE (A) NEGATIVE    Type and screen [facility]     Status: None   Collection Time: 05/06/16  5:10 PM  Result Value Ref Range   ABO/RH(D) B POS    Antibody Screen NEG    Sample Expiration 05/09/2016   POC occult blood, ED      Status: Abnormal   Collection Time: 05/06/16  5:22 PM  Result Value Ref Range   Fecal Occult Bld POSITIVE (A) NEGATIVE  Hemoglobin     Status: Abnormal   Collection Time: 05/06/16  7:27 PM  Result Value Ref Range   Hemoglobin 8.7 (L) 12.0 - 15.0 g/dL  Hematocrit     Status: Abnormal   Collection Time: 05/06/16  7:27 PM  Result Value Ref Range   HCT 27.7 (L) 36.0 - 46.0 %  Magnesium     Status: None   Collection Time: 05/06/16  7:27 PM  Result Value Ref Range   Magnesium 1.9 1.7 - 2.4 mg/dL  Phosphorus     Status: None   Collection Time: 05/06/16  7:27 PM  Result Value Ref Range   Phosphorus 4.1 2.5 - 4.6 mg/dL  MRSA PCR Screening     Status: Abnormal   Collection Time: 05/06/16  8:40 PM  Result Value Ref Range   MRSA by PCR POSITIVE (A) NEGATIVE    Comment:        The GeneXpert MRSA Assay (FDA approved for NASAL specimens only), is one component of a comprehensive MRSA colonization surveillance program. It is not intended to diagnose MRSA infection nor to guide or monitor treatment for MRSA infections. RESULT CALLED TO, READ BACK BY AND VERIFIED WITH: TSOUTIS,J RN 2345 05/06/16 MITCHELL,L   Urinalysis, Routine w reflex microscopic     Status: Abnormal   Collection Time: 05/06/16  8:57 PM  Result Value Ref Range   Color, Urine YELLOW YELLOW   APPearance CLOUDY (A) CLEAR   Specific Gravity, Urine 1.014 1.005 - 1.030   pH 7.0 5.0 - 8.0   Glucose, UA NEGATIVE NEGATIVE mg/dL   Hgb urine dipstick NEGATIVE NEGATIVE   Bilirubin Urine NEGATIVE NEGATIVE   Ketones, ur NEGATIVE NEGATIVE mg/dL   Protein, ur NEGATIVE NEGATIVE mg/dL   Nitrite NEGATIVE NEGATIVE   Leukocytes, UA SMALL (A) NEGATIVE  Urine microscopic-add on     Status: Abnormal   Collection Time: 05/06/16  8:57 PM  Result Value Ref Range   Squamous Epithelial / LPF 0-5 (A) NONE SEEN   WBC, UA 6-30 0 - 5 WBC/hpf   RBC / HPF 0-5 0 - 5 RBC/hpf   Bacteria, UA MANY (A) NONE SEEN  Comprehensive metabolic panel      Status: Abnormal   Collection Time: 05/07/16  5:37 AM  Result Value Ref Range   Sodium 146 (H) 135 - 145 mmol/L    Comment: DELTA CHECK NOTED   Potassium 3.7 3.5 - 5.1 mmol/L   Chloride 111 101 - 111 mmol/L   CO2 24 22 - 32 mmol/L   Glucose, Bld 102 (H) 65 - 99 mg/dL   BUN 58 (H) 6 - 20 mg/dL   Creatinine, Ser 1.11 (H) 0.44 - 1.00 mg/dL   Calcium 8.1 (L) 8.9 - 10.3 mg/dL   Total Protein 5.4 (L) 6.5 - 8.1 g/dL   Albumin 2.6 (L) 3.5 - 5.0 g/dL   AST 16 15 - 41 U/L   ALT 15 14 - 54 U/L   Alkaline Phosphatase 66 38 - 126 U/L   Total Bilirubin 0.5 0.3 -   1.2 mg/dL   GFR calc non Af Amer 51 (L) >60 mL/min   GFR calc Af Amer 59 (L) >60 mL/min    Comment: (NOTE) The eGFR has been calculated using the CKD EPI equation. This calculation has not been validated in all clinical situations. eGFR's persistently <60 mL/min signify possible Chronic Kidney Disease.    Anion gap 11 5 - 15    No results found.             Blood pressure 130/51, pulse 65, temperature 98.6 F (37 C), temperature source Oral, resp. rate 21, height 5' 7" (1.702 m), weight 62.324 kg (137 lb 6.4 oz), SpO2 97 %.  Physical exam:   General--Pleasant white female in no acute distress ENT-- nonicteric Neck-- no lymphadenopathy Heart-- regular rate and rhythm without murmurs gallops Lungs-- clear Abdomen-- nondistended and nontender with good bowel sounds Psych-- alert and oriented. Answers questions appropriately   Assessment: 1. Hematemesis. Patient has documented vomitus positive for blood. Cause unclear. She is on antiplatelet agents for peripheral arterial disease. Drop in hemoglobin is worrisome. 2. History of fungal esophagitis. Should've been adequately treated 7 or 8 months ago when she had previous EGD 3. History of adenomatous: polyp with otherwise negative colonoscopy 10/16 4. Peripheral vascular disease with multiple prior procedures 5. Scleroderma 6. Pulmonary hypertension    Plan: 1.  Given the hematemesis with documented blood and vomitus I think we should definitely go ahead with another EGD. She had a very good: exam just 5 months ago with no significant findings other than a single polyp don't feel that needs to be repeated at this time. Have discussed this with the patient. We'll try to get her work in later today.   Cristol Engdahl JR,Judit Awad L 05/07/2016, 6:55 AM   This note was created using voice recognition software and minor errors may Have occurred unintentionally. Pager: [phone] If no answer or after hours call [phone]     

## 2016-05-07 NOTE — Op Note (Signed)
[facility] Patient Name: [name] [name] Procedure Date : 05/07/2016 MRN: [id number] Attending MD: [name] , MD Date of Birth: December 18, 1951 CSN: [id number] Age: 65 Admit Type: Inpatient Procedure:                Upper GI endoscopy Indications:              Acute post hemorrhagic anemia, Iron deficiency                            anemia secondary to chronic blood loss Providers:                Jeneen Rinks L. Oletta Lamas, MD, Kingsley Plan, RN, Janie                            Billups, Technician, Tawni Carnes, CRNA Referring MD:              Medicines:                Propofol total dose 130 mg IV, Cetacaine spray Complications:            No immediate complications. Estimated Blood Loss:     Estimated blood loss: none. Procedure:                Pre-Anesthesia Assessment:                           - Prior to the procedure, a History and Physical                            was performed, and patient medications and                            allergies were reviewed. The patient's tolerance of                            previous anesthesia was also reviewed. The risks                            and benefits of the procedure and the sedation                            options and risks were discussed with the patient.                            All questions were answered, and informed consent                            was obtained. Prior Anticoagulants: The patient has                            taken no previous anticoagulant or antiplatelet                            agents. ASA Grade Assessment: III - A patient with  severe systemic disease. After reviewing the risks                            and benefits, the patient was deemed in                            satisfactory condition to undergo the procedure.                           After obtaining informed consent, the endoscope was                            passed under direct vision. Throughout  the                            procedure, the patient's blood pressure, pulse, and                            oxygen saturations were monitored continuously. The                            [id number] ([id number]) scope was introduced through the                            mouth, and advanced to the second part of duodenum.                            The upper GI endoscopy was accomplished without                            difficulty. The patient tolerated the procedure                            well. Scope In: Scope Out: Findings:      Mildly severe esophagitis with no bleeding was found in the lower third       of the esophagus.      The stomach was normal.      The examined duodenum was normal. Impression:               - Mildly severe reflux esophagitis.                           - Normal stomach.                           - Normal examined duodenum.                           - No specimens collected. Moderate Sedation:      MAC by anesthesia Recommendation:           - Return patient to hospital ward for ongoing care.                           - Resume previous diet.                           -  Continue present medications. Procedure Code(s):        --- Professional ---                           [phone], Esophagogastroduodenoscopy, flexible,                            transoral; diagnostic, including collection of                            specimen(s) by brushing or washing, when performed                            (separate procedure) Diagnosis Code(s):        --- Professional ---                           D62, Acute posthemorrhagic anemia                           K21.0, Gastro-esophageal reflux disease with                            esophagitis                           D50.0, Iron deficiency anemia secondary to blood                            loss (chronic) CPT copyright 2016 [employer]. All rights reserved. The codes documented in this report are  preliminary and upon coder review may  be revised to meet current compliance requirements. [name], MD [name], MD 05/07/2016 2:17:04 PM This report has been signed electronically. Number of Addenda: 0

## 2016-05-07 NOTE — Progress Notes (Signed)
[name], female   DOB: 16-Jan-1951, 65 y.o.   MRN: [id number]  PROGRESS NOTE    [name] [name]  JGO:[id number] DOB: October 23, 1951 DOA: 05/06/2016  PCP: Hoyt Koch, MD  Outpatient Specialists: Pulmonary, Dr. Baltazar Apo  Brief Narrative:  65 y.o. female with medical history significant of hypertension, scleroderma, non-Hodgkin lymphoma, melanoma, peripheral tibial occlusive disease, neuropathic foot ulcer, pulmonary hypertension, anemia. Patient presented to [facility] with fatigue and vomiting started the morning of this admission. Patient reported having coffee ground emesis, 3 episodes at home prior to coming to emergency room. Patient also reported melanotic stool. She is on aspirin and Plavix.  In ED, patient was hemodynamically stable. Hemoglobin was 8.8 on the admission but further drop noted in 24 hours, 6.8 on 05/07/2016. GI has seen the patient in consultation and plans for endoscopy 05/07/2016.  Assessment & Plan:   Acute blood loss anemia / acute upper GI bleed - Likely triggered by aspirin and Plavix which were placed on hold - Patient started on Protonix drip and Protonix 40 mg IV every 12 hours - Her hemoglobin is 6.8 this morning so she will get 1 unit of PRBC transfusion - GI has seen the patient in consultation, plan for EGD today - Continue supportive care with IV fluids, nothing by mouth - Continue antiemetics as needed-    Hypernatremia  - Sodium 146 this morning, likely dehydration  - Continue IV fluids  - Follow-up BMP tomorrow morning   Chronic kidney disease stage III - Baseline creatinine 1.69 about 6 months ago\ - Creatinine on this admission 1.5, within baseline range  Dyslipidemia - Resume statin once patient on solids  Scleroderma, circumscribed or localized - Continue your prednisone 2.63m daily for now, resume once back from EGD - Continue oxygen at 2-3 L/min per home regimen   Pulmonary hypertension associated  with systemic disorder (HCC) - Sees Dr. BLamonte Sakaiin clinic   DVT prophylaxis: SCDs bilaterally due to risk of bleeding Code Status: full code  Family Communication: Family not at the bedside this morning Disposition Plan: Home once cleared by GI   Consultants:   GI, Dr. JLaurence Spates  Procedures:   EGD 05/07/2016   Antimicrobials:   None     Subjective: Feels better this morning.  Objective: Filed Vitals:   05/06/16 2000 05/06/16 2034 05/06/16 2335 05/07/16 0441  BP: 117/53 126/45 110/49 130/51  Pulse: 74  65   Temp:  99.6 F (37.6 C)  98.6 F (37 C)  TempSrc:  Oral  Oral  Resp: 22  21   Height:  _0  (1.702 m)    Weight:  63.231 kg (139 lb 6.4 oz)  62.324 kg (137 lb 6.4 oz)  SpO2: 100%  94% 97%    Intake/Output Summary (Last 24 hours) at 05/07/16 0940 Last data filed at 05/07/16 0851  Gross per 24 hour  Intake 3315.83 ml  Output   2100 ml  Net 1215.83 ml   Filed Weights   05/06/16 2034 05/07/16 0441  Weight: 63.231 kg (139 lb 6.4 oz) 62.324 kg (137 lb 6.4 oz)    Examination:  General exam: Appears calm and comfortable  Respiratory system: Clear to auscultation. Respiratory effort normal. Cardiovascular system: S1 & S2 heard, RRR. No JVD, murmurs, rubs, gallops or clicks. No pedal edema. Gastrointestinal system: Abdomen is nondistended, soft and nontender. No organomegaly or masses felt. Normal bowel sounds heard. Central nervous system: Alert and oriented. No focal neurological deficits.  Extremities: Symmetric 5 x 5 power. Skin: No rashes, lesions or ulcers Psychiatry: Judgement and insight appear normal. Mood & affect appropriate.   Data Reviewed: I have personally reviewed following labs and imaging studies  CBC:  Recent Labs Lab 05/02/16 1203 05/06/16 1455 05/06/16 1927 05/07/16 0537  WBC 13.0* 9.5  --  4.4  NEUTROABS  --   --   --  3.0  HGB 9.8* 8.8* 8.7* 6.8*  HCT 29.2* 27.6* 27.7* 22.0*  MCV 97.3 103.0*  --  102.8*  PLT 266.0 341  --   767   Basic Metabolic Panel:  Recent Labs Lab 05/06/16 1455 05/06/16 1927 05/07/16 0537  NA 139  --  146*  K 4.0  --  3.7  CL 93*  --  111  CO2 30  --  24  GLUCOSE 123*  --  102*  BUN 65*  --  58*  CREATININE 1.50*  --  1.11*  CALCIUM 9.6  --  8.1*  MG  --  1.9  --   PHOS  --  4.1  --    GFR: Estimated Creatinine Clearance: 48.5 mL/min (by C-G formula based on Cr of 1.11). Liver Function Tests:  Recent Labs Lab 05/06/16 1455 05/07/16 0537  AST 21 16  ALT 19 15  ALKPHOS 96 66  BILITOT 0.7 0.5  PROT 7.0 5.4*  ALBUMIN 3.2* 2.6*    Recent Labs Lab 05/06/16 1455  LIPASE 87*   No results for input(s): AMMONIA in the last 168 hours. Coagulation Profile: No results for input(s): INR, PROTIME in the last 168 hours. Cardiac Enzymes: No results for input(s): CKTOTAL, CKMB, CKMBINDEX, TROPONINI in the last 168 hours. BNP (last 3 results)  Recent Labs  09/06/15 1221  PROBNP 305.0*   HbA1C: No results for input(s): HGBA1C in the last 72 hours. CBG: No results for input(s): GLUCAP in the last 168 hours. Lipid Profile: No results for input(s): CHOL, HDL, LDLCALC, TRIG, CHOLHDL, LDLDIRECT in the last 72 hours. Thyroid Function Tests: No results for input(s): TSH, T4TOTAL, FREET4, T3FREE, THYROIDAB in the last 72 hours. Anemia Panel: No results for input(s): VITAMINB12, FOLATE, FERRITIN, TIBC, IRON, RETICCTPCT in the last 72 hours. Urine analysis:    Component Value Date/Time   COLORURINE YELLOW 05/06/2016 2057   APPEARANCEUR CLOUDY* 05/06/2016 2057   LABSPEC 1.014 05/06/2016 2057   PHURINE 7.0 05/06/2016 2057   GLUCOSEU NEGATIVE 05/06/2016 2057   HGBUR NEGATIVE 05/06/2016 2057   BILIRUBINUR NEGATIVE 05/06/2016 2057   BILIRUBINUR n 08/12/2012 1420   KETONESUR NEGATIVE 05/06/2016 2057   PROTEINUR NEGATIVE 05/06/2016 2057   PROTEINUR n 08/12/2012 1420   UROBILINOGEN 0.2 08/12/2012 1420   NITRITE NEGATIVE 05/06/2016 2057   NITRITE n 08/12/2012 1420    LEUKOCYTESUR SMALL* 05/06/2016 2057   Sepsis Labs: _0 (procalcitonin:4,lacticidven:4)   ) Recent Results (from the past 240 hour(s))  MRSA PCR Screening     Status: Abnormal   Collection Time: 05/06/16  8:40 PM  Result Value Ref Range Status   MRSA by PCR POSITIVE (A) NEGATIVE Final    Comment:        The GeneXpert MRSA Assay (FDA approved for NASAL specimens only), is one component of a comprehensive MRSA colonization surveillance program. It is not intended to diagnose MRSA infection nor to guide or monitor treatment for MRSA infections. RESULT CALLED TO, READ BACK BY AND VERIFIED WITH: TSOUTIS,J RN 2345 05/06/16 MITCHELL,L       Radiology Studies: No results found.   Scheduled  Meds: . sodium chloride   Intravenous Once  . sodium chloride   Intravenous Once  . [START ON 05/10/2016] pantoprazole (PROTONIX) IV  40 mg Intravenous Q12H   Continuous Infusions: . sodium chloride 100 mL/hr at 05/06/16 2339  . sodium chloride    . pantoprozole (PROTONIX) infusion 8 mg/hr (05/07/16 0456)     LOS: 1 day    Time spent: 25 minutes    Leisa Lenz, MD [facility] Pager [phone]  If 7PM-7AM, please contact night-coverage [email or web address] Password [email or web address] 05/07/2016, 9:40 AM

## 2016-05-07 NOTE — Progress Notes (Signed)
Pt arrived to unit with dressing on RLE.  She states she has a chronic wound on her right leg and is followed by wound clinic every 2 weeks.  She states we are not to remove dressing.  Will consult WOC for eval.

## 2016-05-07 NOTE — Interval H&P Note (Signed)
History and Physical Interval Note:  05/07/2016 1:16 PM  [name] [name]  has presented today for surgery, with the diagnosis of hematemesis  The various methods of treatment have been discussed with the patient and family. After consideration of risks, benefits and other options for treatment, the patient has consented to  Procedure(s): ESOPHAGOGASTRODUODENOSCOPY (EGD) (N/A) as a surgical intervention .  The patient's history has been reviewed, patient examined, no change in status, stable for surgery.  I have reviewed the patient's chart and labs.  Questions were answered to the patient's satisfaction.     Dilynn Munroe JR,Jacarius Handel L

## 2016-05-07 NOTE — Progress Notes (Signed)
Pt noted to have increase in work of breathing upon exertion.  O2 sats 85-89 on 3L of O2 (pt home requirement).  O2 increased to 4L with sats 92-94%.  Lungs diminished but clear to auscultation at present.  Pt does have non productive cough that she states is not new and feels related to sinus drainage.  IVF currently at 100 and pt tolerating clear liquid diet.  Pt also voices anxiety due to her health and spouse's upcoming surgery on Friday.  Pt states she was taking celexa 83m prior to admission and has not had since Saturday am.  Reviewed pt home med list including prednisone, aldactone, torsemide, sildenafil, and celexa.  [facility] provider paged to review and possibly restart home meds.

## 2016-05-07 NOTE — Consult Note (Addendum)
WOC wound consult note Reason for Consult: Consult requested for right leg.  Pt is followed as an outpatient by the wound care center at [facility] and uses Hydroferra blue for topical treatment of a chronic full thickness wound.  She is very well-informed regarding wound care and states her dressing is not due to be changed until Thursday.  She does not want to remove it at this time for an assessment, but is willing to have the [name] assist with changing on Thursday and can assess at that time.  [facility] does not have the product she is using in our formulary. Discussed plan of care with patient and she agrees. Plan: Leave dressing intact to right leg.  If pt is still in the hospital on [place], she will have supplies brought in from home and [facility] will assist her to change the dressing and assess the wound at that time. Julien Girt MSN, RN, [place], [place], [place]

## 2016-05-07 NOTE — Transfer of Care (Signed)
Immediate Anesthesia Transfer of Care Note  Patient: [name] [name]  Procedure(s) Performed: Procedure(s): ESOPHAGOGASTRODUODENOSCOPY (EGD) (N/A)  Patient Location: PACU and Endoscopy Unit  Anesthesia Type:MAC  Level of Consciousness: awake, alert , oriented and patient cooperative  Airway & Oxygen Therapy: Patient Spontanous Breathing and Patient connected to nasal cannula oxygen  Post-op Assessment: Report given to RN, Post -op Vital signs reviewed and stable and Patient moving all extremities X 4  Post vital signs: Reviewed and stable  Last Vitals:  Filed Vitals:   05/07/16 1233 05/07/16 1310  BP: 134/53 132/47  Pulse:  79  Temp: 37.3 C 37.1 C  Resp: 20 20    Last Pain: There were no vitals filed for this visit.       Complications: No apparent anesthesia complications

## 2016-05-07 NOTE — Anesthesia Preprocedure Evaluation (Signed)
Anesthesia Evaluation  Patient identified by MRN, date of birth, ID band Patient awake    Reviewed: Allergy & Precautions, NPO status   Airway Mallampati: II  TM Distance: >3 FB Neck ROM: Full    Dental   Pulmonary shortness of breath,    breath sounds clear to auscultation       Cardiovascular hypertension, + Peripheral Vascular Disease and + DOE   Rhythm:Regular Rate:Normal     Neuro/Psych    GI/Hepatic Neg liver ROS, GI history noted. CE   Endo/Other  negative endocrine ROS  Renal/GU negative Renal ROS     Musculoskeletal   Abdominal   Peds  Hematology   Anesthesia Other Findings   Reproductive/Obstetrics                             Anesthesia Physical Anesthesia Plan  ASA: III  Anesthesia Plan: MAC   Post-op Pain Management:    Induction: Intravenous  Airway Management Planned: Simple Face Mask  Additional Equipment:   Intra-op Plan:   Post-operative Plan:   Informed Consent: I have reviewed the patients History and Physical, chart, labs and discussed the procedure including the risks, benefits and alternatives for the proposed anesthesia with the patient or authorized representative who has indicated his/her understanding and acceptance.   Dental advisory given  Plan Discussed with: CRNA and Anesthesiologist  Anesthesia Plan Comments:         Anesthesia Quick Evaluation

## 2016-05-07 NOTE — Consult Note (Signed)
[facility] CONSULT Reason for consult: hematemesis drop in hemoglobin Referring Physician: [facility] hospital[facility]. PCP: Dr. Pricilla Holm  [name] [name] is an 65 y.o. female.  HPI: I saw her in consultation 10/16 for anemia. She had EGD and colonoscopy at that time. Her EGD showed esophagitis with cytology brushing is consistent with fungal esophagitis and she was discharged on PPI therapy and Diflucan. 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 Colonoscopy was performed as well. The prep was quite good and there was no significant diverticulosis or hemorrhoids. A 1 cm adenoma was removed. I saw her back in the office subsequent to that hospitalization in her hemoglobin had risen to 11.1. It had risen to 12.1 as an outpatient as recently as 3/17 and has been slowly declining since that time. She is on chronic iron therapy in her stools are always dark. She takes aspirin Plavix. Her husband has been in the hospital and returned home last week. She was feeling reasonably well several weeks ago but started to feel tired and fatigued and just did not feel good starting last week but initially attributed this distress over her husband's illness. She had marked fatigue and had some vomiting causing her to come to the emergency room. Her hemoglobin was checked and was down to 8.8. Her stools were positive for blood and she apparently vomited up some material that was tested and was positive for blood. She denies abdominal pain, any nausea prior to the past 2 or 3 days. She has multiple medical problems including scleroderma and a history of non-Hodgkin's lymphoma treated about 5 or 6 years ago. She has peripheral arterial disease with problems with foot ulcers. She has primary pulmonary hypertension. She is followed by Dr. Lamonte Sakai. She has had previous surgeries that included multiple vascular procedures as well as lysis adhesions in  some type of small intestinal surgery in the distant past   Past Medical History  Diagnosis Date  . History of chicken pox   . Hypertension   . 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 Non Hodgkin's lymphoma ([place])     Treated with chemo 2010, felt to be cured.  . Melanoma ([place]) 1999  . Peripheral arterial occlusive disease ([place])   . 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[place])   . DOE (dyspnea on exertion)   . PAH (pulmonary artery hypertension) ([place])   . Epistaxis   . Pulmonary hypertension ([place])   . Anemia     Past Surgical History  Procedure Laterality Date  . Rt little toe removal  10/1998  . Stent left thigh  3/11  . Tonsillectomy and adenoidectomy    . Small intestine surgery    . Biopsy on cerebellum    . Vein ligation and stripping  05/16/2012    Procedure: VEIN LIGATION AND STRIPPING;  Surgeon: [name], MD;  Location: [facility] OR;  Service: Vascular;  Laterality: Right;  Irrigation and Debridement right lower leg, ligation of saphenous vein.  . Endovenous ablation saphenous vein w/ laser  07-24-2012    right greater saphenous vein by [name], M.D.   . Endovenous ablation saphenous vein w/ laser  10-16-2012    left greater saphenous vein  by Dr. [name]  . Apligraph  12-04-2102,   12-18-2012    bilateral calves   [name]  . Tunneled venous catheter placement  01/2013  . Right heart catheterization N/A 11/14/2012    Procedure: RIGHT  HEART CATH;  Surgeon: [name], MD;  Location: [facility] CATH LAB;  Service: Cardiovascular;  Laterality: N/A;  . Endovenous ablation saphenous vein w/ laser Right 09-01-2015    endovenous laser ablation right greater saphenous vein by [name] MD  . Esophagogastroduodenoscopy (egd) with propofol Left 10/07/2015    Procedure: ESOPHAGOGASTRODUODENOSCOPY (EGD) WITH PROPOFOL;  Surgeon: [name], MD;  Location: [place];  Service: Endoscopy;  Laterality: Left;  . Colonoscopy with propofol Left 10/07/2015    Procedure: COLONOSCOPY  WITH PROPOFOL;  Surgeon: [name], MD;  Location: [place];  Service: Endoscopy;  Laterality: Left;    Family History  Problem Relation Age of Onset  . Lupus Father   . Lymphoma Father   . Hyperlipidemia Mother   . Cancer Daughter     sarcoma  . Colon cancer      Social History:  reports that she has never smoked. She has never used smokeless tobacco. She reports that she drinks alcohol. She reports that she does not use illicit drugs.  Allergies:  Allergies  Allergen Reactions  . Sulfa Antibiotics     Severe rash with significant peeling of face. No airway involvement per patient  . Levaquin [Levofloxacin In D5w] Other (See Comments)    FLU LIKE SYMPTOM  . Penicillins Rash    Medications; Prior to Admission medications   Medication Sig Start Date End Date Taking? Authorizing Provider  acidophilus (RISAQUAD) CAPS Take 1 capsule by mouth every morning.    Yes Historical Provider, MD  aspirin 81 MG tablet Take 1 tablet (81 mg total) by mouth every evening. Resume in 1 week. 10/15/15  Yes Eugenie Filler, MD  atorvastatin (LIPITOR) 10 MG tablet Take 1 tablet (10 mg total) by mouth daily. 03/09/16  Yes Hoyt Koch, MD  azithromycin (ZITHROMAX) 250 MG tablet Take 250 mg by mouth daily. 05/03/16  Yes Historical Provider, MD  BIOTIN PO Take 1 tablet by mouth daily.   Yes Historical Provider, MD  cholecalciferol (VITAMIN D) 1000 UNITS tablet Take 2,000 Units by mouth every evening.    Yes Historical Provider, MD  citalopram (CELEXA) 20 MG tablet Take 30 mg by mouth daily.   Yes Historical Provider, MD  clopidogrel (PLAVIX) 75 MG tablet take 1 tablet by mouth once daily 11/23/15  Yes Hoyt Koch, MD  fish oil-omega-3 fatty acids 1000 MG capsule Take 1 g by mouth daily with lunch.    Yes Historical Provider, MD  gabapentin (NEURONTIN) 300 MG capsule take 1 capsule by mouth every morning and 2 every evening Patient taking differently: Take 300-600 mg by mouth 2  (two) times daily. take 1 capsule by mouth every morning and 2 every evening 03/09/16  Yes Hoyt Koch, MD  Macitentan (OPSUMIT) 10 MG TABS Take 10 mg by mouth daily. 03/09/16  Yes Hoyt Koch, MD  moxifloxacin (VIGAMOX) 0.5 % ophthalmic solution Place 1 drop into both eyes 3 (three) times daily as needed (allergies).    Yes Historical Provider, MD  multivitamin [facility]) per tablet Take 1 tablet by mouth daily.    Yes Historical Provider, MD  OXYGEN Inhale 2 L into the lungs continuous.   Yes Historical Provider, MD  pantoprazole (PROTONIX) 40 MG tablet Take 1 tablet (40 mg total) by mouth daily. 03/09/16  Yes Hoyt Koch, MD  POLY-IRON 150 150 MG capsule take 1 capsule by mouth twice a day 04/30/16  Yes Hoyt Koch, MD  predniSONE (DELTASONE) 2.5 MG tablet  Take 2.5 mg by mouth daily. 04/23/16  Yes Historical Provider, MD  sildenafil (REVATIO) 20 MG tablet Take 40 mg by mouth 3 (three) times daily. 04/02/16  Yes Historical Provider, MD  spironolactone (ALDACTONE) 25 MG tablet Take 1 tablet (25 mg total) by mouth daily. 03/09/16  Yes Hoyt Koch, MD  torsemide (DEMADEX) 20 MG tablet Take 2 tablets (40 mg total) by mouth 2 (two) times daily. 03/09/16  Yes Hoyt Koch, MD  vitamin B-12 (CYANOCOBALAMIN) 500 MCG tablet Take 500 mcg by mouth every morning.    Yes Historical Provider, MD  potassium chloride (K-DUR) 10 MEQ tablet Take 1 tablet (10 mEq total) by mouth daily. 03/09/16   Hoyt Koch, MD  predniSONE (DELTASONE) 5 MG tablet take 1 tablet by mouth once daily WITH BREAKFAST Patient taking differently: Take 2.5 mg by mouth. take 1 tablet by mouth once daily WITH BREAKFAST 03/09/16   Hoyt Koch, MD  Tadalafil, PAH, (ADCIRCA) 20 MG TABS Take 2 tablets (40 mg total) by mouth daily. 05/01/16   [name], MD   . Derrill Memo ON 05/10/2016] pantoprazole (PROTONIX) IV  40 mg Intravenous Q12H   PRN Meds diphenhydrAMINE, ondansetron **OR**  ondansetron (ZOFRAN) IV Results for orders placed or performed during the hospital encounter of 05/06/16 (from the past 48 hour(s))  Lipase, blood     Status: Abnormal   Collection Time: 05/06/16  2:55 PM  Result Value Ref Range   Lipase 87 (H) 11 - 51 U/L  Comprehensive metabolic panel     Status: Abnormal   Collection Time: 05/06/16  2:55 PM  Result Value Ref Range   Sodium 139 135 - 145 mmol/L   Potassium 4.0 3.5 - 5.1 mmol/L   Chloride 93 (L) 101 - 111 mmol/L   CO2 30 22 - 32 mmol/L   Glucose, Bld 123 (H) 65 - 99 mg/dL   BUN 65 (H) 6 - 20 mg/dL   Creatinine, Ser 1.50 (H) 0.44 - 1.00 mg/dL   Calcium 9.6 8.9 - 10.3 mg/dL   Total Protein 7.0 6.5 - 8.1 g/dL   Albumin 3.2 (L) 3.5 - 5.0 g/dL   AST 21 15 - 41 U/L   ALT 19 14 - 54 U/L   Alkaline Phosphatase 96 38 - 126 U/L   Total Bilirubin 0.7 0.3 - 1.2 mg/dL   GFR calc non Af Amer 35 (L) >60 mL/min   GFR calc Af Amer 41 (L) >60 mL/min    Comment: (NOTE) The eGFR has been calculated using the CKD EPI equation. This calculation has not been validated in all clinical situations. eGFR's persistently <60 mL/min signify possible Chronic Kidney Disease.    Anion gap 16 (H) 5 - 15  CBC     Status: Abnormal   Collection Time: 05/06/16  2:55 PM  Result Value Ref Range   WBC 9.5 4.0 - 10.5 K/uL   RBC 2.68 (L) 3.87 - 5.11 MIL/uL   Hemoglobin 8.8 (L) 12.0 - 15.0 g/dL   HCT 27.6 (L) 36.0 - 46.0 %   MCV 103.0 (H) 78.0 - 100.0 fL   MCH 32.8 26.0 - 34.0 pg   MCHC 31.9 30.0 - 36.0 g/dL   RDW 14.4 11.5 - 15.5 %   Platelets 341 150 - 400 K/uL  Occult bld gastric/duodenum (cup to lab)     Status: Abnormal   Collection Time: 05/06/16  5:10 PM  Result Value Ref Range   Occult Blood, Gastric POSITIVE (A) NEGATIVE  Type and screen [place]     Status: None   Collection Time: 05/06/16  5:10 PM  Result Value Ref Range   ABO/RH(D) B POS    Antibody Screen NEG    Sample Expiration 05/09/2016   POC occult blood, ED      Status: Abnormal   Collection Time: 05/06/16  5:22 PM  Result Value Ref Range   Fecal Occult Bld POSITIVE (A) NEGATIVE  Hemoglobin     Status: Abnormal   Collection Time: 05/06/16  7:27 PM  Result Value Ref Range   Hemoglobin 8.7 (L) 12.0 - 15.0 g/dL  Hematocrit     Status: Abnormal   Collection Time: 05/06/16  7:27 PM  Result Value Ref Range   HCT 27.7 (L) 36.0 - 46.0 %  Magnesium     Status: None   Collection Time: 05/06/16  7:27 PM  Result Value Ref Range   Magnesium 1.9 1.7 - 2.4 mg/dL  Phosphorus     Status: None   Collection Time: 05/06/16  7:27 PM  Result Value Ref Range   Phosphorus 4.1 2.5 - 4.6 mg/dL  MRSA PCR Screening     Status: Abnormal   Collection Time: 05/06/16  8:40 PM  Result Value Ref Range   MRSA by PCR POSITIVE (A) NEGATIVE    Comment:        The GeneXpert MRSA Assay (FDA approved for NASAL specimens only), is one component of a comprehensive MRSA colonization surveillance program. It is not intended to diagnose MRSA infection nor to guide or monitor treatment for MRSA infections. RESULT CALLED TO, READ BACK BY AND VERIFIED WITH: TSOUTIS,J RN 2345 05/06/16 MITCHELL,L   Urinalysis, Routine w reflex microscopic     Status: Abnormal   Collection Time: 05/06/16  8:57 PM  Result Value Ref Range   Color, Urine YELLOW YELLOW   APPearance CLOUDY (A) CLEAR   Specific Gravity, Urine 1.014 1.005 - 1.030   pH 7.0 5.0 - 8.0   Glucose, UA NEGATIVE NEGATIVE mg/dL   Hgb urine dipstick NEGATIVE NEGATIVE   Bilirubin Urine NEGATIVE NEGATIVE   Ketones, ur NEGATIVE NEGATIVE mg/dL   Protein, ur NEGATIVE NEGATIVE mg/dL   Nitrite NEGATIVE NEGATIVE   Leukocytes, UA SMALL (A) NEGATIVE  Urine microscopic-add on     Status: Abnormal   Collection Time: 05/06/16  8:57 PM  Result Value Ref Range   Squamous Epithelial / LPF 0-5 (A) NONE SEEN   WBC, UA 6-30 0 - 5 WBC/hpf   RBC / HPF 0-5 0 - 5 RBC/hpf   Bacteria, UA MANY (A) NONE SEEN  Comprehensive metabolic panel      Status: Abnormal   Collection Time: 05/07/16  5:37 AM  Result Value Ref Range   Sodium 146 (H) 135 - 145 mmol/L    Comment: DELTA CHECK NOTED   Potassium 3.7 3.5 - 5.1 mmol/L   Chloride 111 101 - 111 mmol/L   CO2 24 22 - 32 mmol/L   Glucose, Bld 102 (H) 65 - 99 mg/dL   BUN 58 (H) 6 - 20 mg/dL   Creatinine, Ser 1.11 (H) 0.44 - 1.00 mg/dL   Calcium 8.1 (L) 8.9 - 10.3 mg/dL   Total Protein 5.4 (L) 6.5 - 8.1 g/dL   Albumin 2.6 (L) 3.5 - 5.0 g/dL   AST 16 15 - 41 U/L   ALT 15 14 - 54 U/L   Alkaline Phosphatase 66 38 - 126 U/L   Total Bilirubin 0.5 0.3 -  1.2 mg/dL   GFR calc non Af Amer 51 (L) >60 mL/min   GFR calc Af Amer 59 (L) >60 mL/min    Comment: (NOTE) The eGFR has been calculated using the CKD EPI equation. This calculation has not been validated in all clinical situations. eGFR's persistently <60 mL/min signify possible Chronic Kidney Disease.    Anion gap 11 5 - 15    No results found.             Blood pressure 130/51, pulse 65, temperature 98.6 F (37 C), temperature source Oral, resp. rate 21, height _0  (1.702 m), weight 62.324 kg (137 lb 6.4 oz), SpO2 97 %.  Physical exam:   General--Pleasant white female in no acute distress ENT-- nonicteric Neck-- no lymphadenopathy Heart-- regular rate and rhythm without murmurs gallops Lungs-- clear Abdomen-- nondistended and nontender with good bowel sounds Psych-- alert and oriented. Answers questions appropriately   Assessment: 1. Hematemesis. Patient has documented vomitus positive for blood. Cause unclear. She is on antiplatelet agents for peripheral arterial disease. Drop in hemoglobin is worrisome. 2. History of fungal esophagitis. Should've been adequately treated 7 or 8 months ago when she had previous EGD 3. History of adenomatous: polyp with otherwise negative colonoscopy 10/16 4. Peripheral vascular disease with multiple prior procedures 5. Scleroderma 6. Pulmonary hypertension    Plan: 1.  Given the hematemesis with documented blood and vomitus I think we should definitely go ahead with another EGD. She had a very good: exam just 5 months ago with no significant findings other than a single polyp don't feel that needs to be repeated at this time. Have discussed this with the patient. We'll try to get her work in later today.   Kendrew Paci JR,Mansa Willers L 05/07/2016, 6:55 AM   This note was created using voice recognition software and minor errors may Have occurred unintentionally. Pager: [phone] If no answer or after hours call [phone]

## 2016-05-07 NOTE — Anesthesia Postprocedure Evaluation (Signed)
Anesthesia Post Note  Patient: [name] [name]  Procedure(s) Performed: Procedure(s) (LRB): ESOPHAGOGASTRODUODENOSCOPY (EGD) (N/A)  Patient location during evaluation: Endoscopy Anesthesia Type: MAC Level of consciousness: awake Pain management: pain level controlled Vital Signs Assessment: post-procedure vital signs reviewed and stable Respiratory status: spontaneous breathing Cardiovascular status: stable Anesthetic complications: no    Last Vitals:  Filed Vitals:   05/07/16 1400 05/07/16 1410  BP: 124/48 117/44  Pulse: 87 71  Temp:    Resp: 21 22    Last Pain: There were no vitals filed for this visit.               EDWARDS,Dejai Schubach

## 2016-05-08 ENCOUNTER — Encounter (HOSPITAL_COMMUNITY): Payer: Self-pay | Admitting: Gastroenterology

## 2016-05-08 ENCOUNTER — Inpatient Hospital Stay (HOSPITAL_COMMUNITY): Payer: BLUE CROSS/BLUE SHIELD

## 2016-05-08 DIAGNOSIS — I272 Other secondary pulmonary hypertension: Secondary | ICD-10-CM

## 2016-05-08 DIAGNOSIS — L940 Localized scleroderma [morphea]: Secondary | ICD-10-CM

## 2016-05-08 DIAGNOSIS — E876 Hypokalemia: Secondary | ICD-10-CM

## 2016-05-08 LAB — CBC WITH DIFFERENTIAL/PLATELET
Basophils Absolute: 0 10*3/uL (ref 0.0–0.1)
Basophils Relative: 0 %
Eosinophils Absolute: 0.1 10*3/uL (ref 0.0–0.7)
Eosinophils Relative: 1 %
HCT: 26.5 % — ABNORMAL LOW (ref 36.0–46.0)
Hemoglobin: 8.3 g/dL — ABNORMAL LOW (ref 12.0–15.0)
Lymphocytes Relative: 13 %
Lymphs Abs: 1 10*3/uL (ref 0.7–4.0)
MCH: 31.4 pg (ref 26.0–34.0)
MCHC: 31.3 g/dL (ref 30.0–36.0)
MCV: 100.4 fL — ABNORMAL HIGH (ref 78.0–100.0)
Monocytes Absolute: 0.6 10*3/uL (ref 0.1–1.0)
Monocytes Relative: 8 %
Neutro Abs: 5.6 10*3/uL (ref 1.7–7.7)
Neutrophils Relative %: 78 %
Platelets: 291 10*3/uL (ref 150–400)
RBC: 2.64 MIL/uL — ABNORMAL LOW (ref 3.87–5.11)
RDW: 15 % (ref 11.5–15.5)
WBC: 7.3 10*3/uL (ref 4.0–10.5)

## 2016-05-08 LAB — COMPREHENSIVE METABOLIC PANEL
ALT: 16 U/L (ref 14–54)
AST: 25 U/L (ref 15–41)
Albumin: 2.9 g/dL — ABNORMAL LOW (ref 3.5–5.0)
Alkaline Phosphatase: 82 U/L (ref 38–126)
Anion gap: 17 — ABNORMAL HIGH (ref 5–15)
BUN: 21 mg/dL — ABNORMAL HIGH (ref 6–20)
CO2: 21 mmol/L — ABNORMAL LOW (ref 22–32)
Calcium: 8.3 mg/dL — ABNORMAL LOW (ref 8.9–10.3)
Chloride: 106 mmol/L (ref 101–111)
Creatinine, Ser: 0.91 mg/dL (ref 0.44–1.00)
GFR calc Af Amer: >60 mL/min (ref >60)
GFR calc non Af Amer: >60 mL/min (ref >60)
Glucose, Bld: 92 mg/dL (ref 65–99)
Potassium: 3.1 mmol/L — ABNORMAL LOW (ref 3.5–5.1)
Sodium: 144 mmol/L (ref 135–145)
Total Bilirubin: 0.7 mg/dL (ref 0.3–1.2)
Total Protein: 5.9 g/dL — ABNORMAL LOW (ref 6.5–8.1)

## 2016-05-08 LAB — TYPE AND SCREEN
ABO/RH(D): B POS
Antibody Screen: NEGATIVE
Unit division: 0

## 2016-05-08 MED ORDER — SILDENAFIL CITRATE 20 MG PO TABS
40.0000 mg | ORAL_TABLET | Freq: Three times a day (TID) | ORAL | Status: DC
  Administered 2016-05-08: 40 mg via ORAL
  Filled 2016-05-08: qty 2

## 2016-05-08 MED ORDER — CHLORHEXIDINE GLUCONATE CLOTH 2 % EX PADS: 6.0000 | MEDICATED_PAD | Freq: Every day | CUTANEOUS | Status: DC

## 2016-05-08 MED ORDER — GABAPENTIN 300 MG PO CAPS: 300.0000 mg | ORAL_CAPSULE | Freq: Every day | ORAL | Status: DC

## 2016-05-08 MED ORDER — FUROSEMIDE 10 MG/ML IJ SOLN
40.0000 mg | Freq: Once | INTRAMUSCULAR | Status: AC
  Administered 2016-05-08: 40 mg via INTRAVENOUS
  Filled 2016-05-08: qty 4

## 2016-05-08 MED ORDER — ASPIRIN 81 MG PO TABS: 81.0000 mg | ORAL_TABLET | Freq: Every evening | ORAL | Status: DC

## 2016-05-08 MED ORDER — PREDNISONE 5 MG PO TABS
2.5000 mg | ORAL_TABLET | Freq: Every day | ORAL | Status: DC
  Administered 2016-05-08: 2.5 mg via ORAL
  Filled 2016-05-08: qty 1

## 2016-05-08 MED ORDER — GABAPENTIN 300 MG PO CAPS: 600.0000 mg | ORAL_CAPSULE | Freq: Every day | ORAL | Status: DC

## 2016-05-08 MED ORDER — SPIRONOLACTONE 25 MG PO TABS
25.0000 mg | ORAL_TABLET | Freq: Every day | ORAL | Status: DC
  Administered 2016-05-08: 25 mg via ORAL
  Filled 2016-05-08: qty 1

## 2016-05-08 MED ORDER — POTASSIUM CHLORIDE CRYS ER 20 MEQ PO TBCR: 20.0000 meq | EXTENDED_RELEASE_TABLET | Freq: Once | ORAL | Status: DC

## 2016-05-08 MED ORDER — CITALOPRAM HYDROBROMIDE 20 MG PO TABS
30.0000 mg | ORAL_TABLET | Freq: Every day | ORAL | Status: DC
  Administered 2016-05-08: 30 mg via ORAL
  Filled 2016-05-08 (×2): qty 1

## 2016-05-08 MED ORDER — MACITENTAN 10 MG PO TABS: 10.0000 mg | ORAL_TABLET | Freq: Every day | ORAL | Status: DC

## 2016-05-08 MED ORDER — MUPIROCIN 2 % EX OINT
1.0000 "application " | TOPICAL_OINTMENT | Freq: Two times a day (BID) | CUTANEOUS | Status: DC
  Administered 2016-05-08: 1 via NASAL
  Filled 2016-05-08: qty 22

## 2016-05-08 MED ORDER — ATORVASTATIN CALCIUM 10 MG PO TABS: 10.0000 mg | ORAL_TABLET | Freq: Every day | ORAL | Status: DC

## 2016-05-08 MED ORDER — TORSEMIDE 20 MG PO TABS
40.0000 mg | ORAL_TABLET | Freq: Two times a day (BID) | ORAL | Status: DC
  Administered 2016-05-08: 40 mg via ORAL
  Filled 2016-05-08: qty 2

## 2016-05-08 MED ORDER — PANTOPRAZOLE SODIUM 40 MG PO TBEC: 40.0000 mg | DELAYED_RELEASE_TABLET | Freq: Two times a day (BID) | ORAL | Status: DC

## 2016-05-08 NOTE — Progress Notes (Signed)
Pt voided 500 cc urine on bedside commode.  Sats remain 95% on venti mask.  Portable  CXR result  pending.

## 2016-05-08 NOTE — Discharge Summary (Signed)
Physician Discharge Summary  [name] [name]:[id number] DOB: [birth year] DOA: 05/06/2016  PCP: Hoyt Koch, MD  Admit date: 05/06/2016 Discharge date: 05/08/2016  Recommendations for Outpatient Follow-up:  Continue Protonix 40 mg twice daily per GI recommendations. Please follow-up in their office in about one month after discharge.  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 female with medical history significant of hypertension, scleroderma, non-Hodgkin lymphoma, melanoma, peripheral tibial occlusive disease, neuropathic foot ulcer, pulmonary hypertension, anemia. Patient presented to [facility] with fatigue and vomiting started the morning of this admission. Patient reported having coffee ground emesis, 3 episodes at home prior to coming to emergency room. Patient also reported melanotic stool. She is on aspirin and Plavix.  In ED, patient was hemodynamically stable. Hemoglobin was 8.8 on the admission but further drop noted in 24 hours, 6.8 on 05/07/2016. GI has seen the patient in consultation and plans for endoscopy 05/07/2016. Her EGD showed esophagitis which per GI is likely related to scleroderma.  Please note, on May 9 8 2017, patient more short of breath in respiratory distress. Chest x-ray showed unchanged cardiomegaly and chronic appearing interstitial coarsening. She was given one dose of Lasix and her respiratory status improved. No further respiratory distress.  Hospital Course:   Assessment & Plan:  Acute blood loss anemia / acute upper GI bleed / esophagitis likely related to scleroderma - Likely triggered by aspirin and Plavix  which were placed on hold - Patient started on Protonix drip and Protonix 40 mg IV every 12 hours - She has received 1 unit of PRBC transfusion 05/07/2016 - Endoscopy done 05/07/2016 with findings of esophagitis and recommendation to continue twice-daily PPI - She will see GI on outpatient basis in one month after discharge  Hypernatremia  - Sodium 146 this morning, likely dehydration  - Sodium normalized with IV fluids  Occlusive peripheral arterial disease - Continue aspirin and Plavix  Chronic kidney disease stage III - Baseline creatinine 1.69 about 6 months ago\ - Creatinine on this admission 1.5 and further normalized during this hospital stay  Hypokalemia - Secondary to GI losses - Continue to supplement  Dyslipidemia - Continue statin on discharge  Scleroderma, circumscribed or localized - Continue your prednisone 2.72m daily for now, resume once back from EGD - Continue oxygen at 2-3 L/min per home regimen   Pulmonary hypertension associated with systemic disorder ([place] - Sees Dr. BLamonte Sakaiin clinic   DVT prophylaxis: SCDs bilaterally due to risk of bleeding Code Status: full code  Family Communication: Friend at the bedside this morning   Consultants:   GI, Dr. JLaurence Spates Procedures:   EGD 05/07/2016  Antimicrobials:   None   Signed:  DLeisa Lenz MD  [facility] 05/08/2016, 12:10 PM  Pager #: [phone] Time spent in minutes: more than 30 minutes   Discharge Exam: Filed Vitals:   05/08/16 0812 05/08/16 0820  BP:    Pulse:    Temp: 98.9 F (37.2 C) 98.9 F (37.2 C)  Resp: 21    Filed Vitals:   05/08/16 0158 05/08/16 0400 05/08/16 0812 05/08/16 0820  BP: 136/61     Pulse:  Temp:  97.8 F (36.6 C) 98.9 F (37.2 C) 98.9 F (37.2 C)  TempSrc:   Oral Oral  Resp:   21   Height:      Weight:  63.05 kg (139 lb)    SpO2: 91%       General: Pt is alert, follows commands appropriately, not in acute  distress Cardiovascular: Regular rate and rhythm, S1/S2 +, no murmurs Respiratory: Clear to auscultation bilaterally, no wheezing, no crackles, no rhonchi Abdominal: Soft, non tender, non distended, bowel sounds +, no guarding Extremities: no edema, no cyanosis, pulses palpable bilaterally DP and PT Neuro: Grossly nonfocal  Discharge Instructions  Discharge Instructions    Call MD for:  difficulty breathing, headache or visual disturbances    Complete by:  As directed      Call MD for:  persistant dizziness or light-headedness    Complete by:  As directed      Call MD for:  persistant nausea and vomiting    Complete by:  As directed      Call MD for:  severe uncontrolled pain    Complete by:  As directed      Diet - low sodium heart healthy    Complete by:  As directed      Discharge instructions    Complete by:  As directed   Continue Protonix 40 mg twice daily per GI recommendations. Please follow-up in their office in about one month after discharge.     Increase activity slowly    Complete by:  As directed             Medication List    STOP taking these medications        azithromycin 250 MG tablet  Commonly known as:  ZITHROMAX      TAKE these medications        acidophilus Caps capsule  Take 1 capsule by mouth every morning.     aspirin 81 MG tablet  Take 1 tablet (81 mg total) by mouth every evening. Resume in 1 week.     atorvastatin 10 MG tablet  Commonly known as:  LIPITOR  Take 1 tablet (10 mg total) by mouth daily.     BIOTIN PO  Take 1 tablet by mouth daily.     cholecalciferol 1000 units tablet  Commonly known as:  VITAMIN D  Take 2,000 Units by mouth every evening.     citalopram 20 MG tablet  Commonly known as:  CELEXA  Take 30 mg by mouth daily.     clopidogrel 75 MG tablet  Commonly known as:  PLAVIX  take 1 tablet by mouth once daily     fish oil-omega-3 fatty acids 1000 MG capsule  Take 1 g by mouth daily with lunch.      gabapentin 300 MG capsule  Commonly known as:  NEURONTIN  take 1 capsule by mouth every morning and 2 every evening     Macitentan 10 MG Tabs  Commonly known as:  OPSUMIT  Take 10 mg by mouth daily.     moxifloxacin 0.5 % ophthalmic solution  Commonly known as:  VIGAMOX  Place 1 drop into both eyes 3 (three) times daily as needed (allergies).     multivitamin per tablet  Take 1 tablet by mouth daily.     OXYGEN  Inhale 2 L into the lungs continuous.     pantoprazole 40 MG tablet  Commonly known as:  PROTONIX  Take 1 tablet (40  mg total) by mouth 2 (two) times daily.     POLY-IRON 150 150 MG capsule  Generic drug:  iron polysaccharides  take 1 capsule by mouth twice a day     potassium chloride 10 MEQ tablet  Commonly known as:  K-DUR  Take 1 tablet (10 mEq total) by mouth daily.     predniSONE 2.5 MG tablet  Commonly known as:  DELTASONE  Take 2.5 mg by mouth daily.     sildenafil 20 MG tablet  Commonly known as:  REVATIO  Take 40 mg by mouth 3 (three) times daily.     spironolactone 25 MG tablet  Commonly known as:  ALDACTONE  Take 1 tablet (25 mg total) by mouth daily.     Tadalafil (PAH) 20 MG Tabs  Commonly known as:  ADCIRCA  Take 2 tablets (40 mg total) by mouth daily.     torsemide 20 MG tablet  Commonly known as:  DEMADEX  Take 2 tablets (40 mg total) by mouth 2 (two) times daily.     vitamin B-12 500 MCG tablet  Commonly known as:  CYANOCOBALAMIN  Take 500 mcg by mouth every morning.           Follow-up Information    Follow up with Hoyt Koch, MD. Schedule an appointment as soon as possible for a visit in 1 week.   Specialty:  Internal Medicine   Why:  Follow up appt after recent hospitalization   Contact information:   [place] [address] [phone]       Schedule an appointment as soon as possible for a visit with EDWARDS Dahlia Client, MD.   Specialty:  Gastroenterology   Why:  Follow up appt after recent  hospitalization   Contact information:   [address] [place] [address] [phone]        The results of significant diagnostics from this hospitalization (including imaging, microbiology, ancillary and laboratory) are listed below for reference.    Significant Diagnostic Studies: Dg Chest Port 1 View  05/08/2016  CLINICAL DATA:  Hypoxia.  Dyspnea. EXAM: PORTABLE CHEST 1 VIEW COMPARISON:  10/05/2015 FINDINGS: There is moderate cardiomegaly, unchanged. There is extensive interstitial and vascular fullness, unchanged. No airspace consolidation. No large effusion. IMPRESSION: Unchanged cardiomegaly and chronic appearing interstitial coarsening. No consolidation. No large effusion. Electronically Signed   By: Andreas Newport M.D.   On: 05/08/2016 02:42    Microbiology: Recent Results (from the past 240 hour(s))  MRSA PCR Screening     Status: Abnormal   Collection Time: 05/06/16  8:40 PM  Result Value Ref Range Status   MRSA by PCR POSITIVE (A) NEGATIVE Final    Comment:        The GeneXpert MRSA Assay (FDA approved for NASAL specimens only), is one component of a comprehensive MRSA colonization surveillance program. It is not intended to diagnose MRSA infection nor to guide or monitor treatment for MRSA infections. RESULT CALLED TO, READ BACK BY AND VERIFIED WITH: TSOUTIS,J RN [id number] 05/06/16 MITCHELL,L      Labs: Basic Metabolic Panel:  Recent Labs Lab 05/06/16 1455 05/06/16 1927 05/07/16 0537 05/08/16 0547  NA 139  --  146* 144  K 4.0  --  3.7 3.1*  CL 93*  --  111 106  CO2 30  --  24 21*  GLUCOSE 123*  --  102* 92  BUN 65*  --  58* 21*  CREATININE 1.50*  --  1.11* 0.91  CALCIUM 9.6  --  8.1* 8.3*  MG  --  1.9  --   --   PHOS  --  4.1  --   --    Liver Function Tests:  Recent Labs Lab 05/06/16 1455 05/07/16 0537 05/08/16 0547  AST _0 ALT _1 ALKPHOS 96 66 82  BILITOT 0.7 0.5 0.7  PROT 7.0 5.4* 5.9*  ALBUMIN 3.2* 2.6*  2.9*    Recent Labs Lab 05/06/16 1455  LIPASE 87*   No results for input(s): AMMONIA in the last 168 hours. CBC:  Recent Labs Lab 05/02/16 1203 05/06/16 1455 05/06/16 1927 05/07/16 0537 05/07/16 1646 05/08/16 0547  WBC 13.0* 9.5  --  4.4  --  7.3  NEUTROABS  --   --   --  3.0  --  5.6  HGB 9.8* 8.8* 8.7* 6.8* 7.9* 8.3*  HCT 29.2* 27.6* 27.7* 22.0* 25.5* 26.5*  MCV 97.3 103.0*  --  102.8*  --  100.4*  PLT 266.0 341  --  285  --  291   Cardiac Enzymes: No results for input(s): CKTOTAL, CKMB, CKMBINDEX, TROPONINI in the last 168 hours. BNP: BNP (last 3 results) No results for input(s): BNP in the last 8760 hours.  ProBNP (last 3 results)  Recent Labs  09/06/15 1221  PROBNP 305.0*    CBG: No results for input(s): GLUCAP in the last 168 hours.

## 2016-05-08 NOTE — Progress Notes (Signed)
Per midlevel, attending MD to address home medications in am.  Pt currently resting on right side in no acute distress.  Pt continues to have DOE when up to bedside commode.  Pt reports fatigue as well.  Will continue to monitor.

## 2016-05-08 NOTE — Progress Notes (Signed)
[facility] PROGRESS NOTE Subjective Pt had SOB last PM better. Stools dark but on iron.  Objective: Vital signs in last 24 hours: Temp:  [97.8 F (36.6 C)-99.1 F (37.3 C)] 97.8 F (36.6 C) (05/09 0400) Pulse Rate:  [70-87] 70 (05/08 2018) Resp:  [18-23] 23 (05/08 2018) BP: (117-152)/(44-65) 136/61 mmHg (05/09 0158) SpO2:  [85 %-98 %] 91 % (05/09 0158) FiO2 (%):  [50 %] 50 % (05/09 0158) Weight:  [63.05 kg (139 lb)] 63.05 kg (139 lb) (05/09 0400) Last BM Date: 05/06/16  Intake/Output from previous day: 05/08 0701 - 05/09 0700 In: 2475 [P.O.:1320; I.V.:775; Blood:380] Out: 3750 [Urine:3750] Intake/Output this shift:    PE: General--O2 no distress  Abdomen--soft nontender,   Lab Results:  Recent Labs  05/06/16 1455 05/06/16 1927 05/07/16 0537 05/07/16 1646 05/08/16 0547  WBC 9.5  --  4.4  --  7.3  HGB 8.8* 8.7* 6.8* 7.9* 8.3*  HCT 27.6* 27.7* 22.0* 25.5* 26.5*  PLT 341  --  285  --  291   BMET  Recent Labs  05/06/16 1455 05/07/16 0537  NA 139 146*  K 4.0 3.7  CL 93* 111  CO2 30 24  CREATININE 1.50* 1.11*   LFT  Recent Labs  05/06/16 1455 05/07/16 0537  PROT 7.0 5.4*  AST 21 16  ALT 19 15  ALKPHOS 96 66  BILITOT 0.7 0.5   PT/INR No results for input(s): LABPROT, INR in the last 72 hours. PANCREAS  Recent Labs  05/06/16 1455  LIPASE 87*         Studies/Results: Dg Chest Port 1 View  05/08/2016  CLINICAL DATA:  Hypoxia.  Dyspnea. EXAM: PORTABLE CHEST 1 VIEW COMPARISON:  10/05/2015 FINDINGS: There is moderate cardiomegaly, unchanged. There is extensive interstitial and vascular fullness, unchanged. No airspace consolidation. No large effusion. IMPRESSION: Unchanged cardiomegaly and chronic appearing interstitial coarsening. No consolidation. No large effusion. Electronically Signed   By: Andreas Newport M.D.   On: 05/08/2016 02:42    Medications: I have reviewed the patient's current medications.  Assessment/Plan: 1.  Hematemesis. Esophagitis on EGD in distal esophagus consistent with reflux. She has scleroderma and suspect causing decreased esophageal motility. Discussed being vertical while eating, HOB elevated etc. Would keep on BID PPI. We would like to see in office in about 1 month after discharge.   Lorianna Spadaccini JR,Prakash Kimberling L 05/08/2016, 7:04 AM  This note was created using voice recognition software. Minor errors may Have occurred unintentionally.  Pager: [phone] If no answer or after hours call [phone]

## 2016-05-08 NOTE — Progress Notes (Signed)
RN, Sharee Pimple, paged NP secondary to pt having SOB and dropping O2 sats. O2 per [place] at 6L now with O2 sat 88-91%. Asked RN to bump O2 to VM at 50% and wean as tolerated. Decreased IVF to 30cc/hr and gave Lasix 47m IV x 1. Stat CXR ordered. Demadex restarted from home med list. Celexa and Prednisone as well. Other meds to be addressed in am.  Note: RN had paged NP earlier in shift but did not relay that pt had increased WOB or hypoxia at that time.  Will follow after meds. May need more Lasix.  KJKG, NP [facility]

## 2016-05-08 NOTE — Progress Notes (Signed)
Pt discharged home. Discharge instructions have been gone over with the patient. IV's removed. Pt given unit number and told to call if they have any concerns regarding their discharge instructions.

## 2016-05-08 NOTE — Progress Notes (Signed)
Pt saturation dropped to 84% on 4L [place], lungs with course crackles rt lung field and fine crackles left base. O2 increased to 5L with sats 85-91%.  [facility] provider paged with orders received.  Pt has received 79m  IV lasix, 50% venti mask applied with sats 94%.  Awating portable chest xray.  Nursing remains with pt at bedside.

## 2016-05-08 NOTE — Discharge Instructions (Signed)
Esophagitis °Esophagitis is inflammation of the esophagus. The esophagus is the tube that carries food and liquids from your mouth to your stomach. Esophagitis can cause soreness or pain in the esophagus. This condition can make it difficult and painful to swallow.  °CAUSES °Most causes of esophagitis are not serious. Common causes of this condition include: °· Gastroesophageal reflux disease (GERD). This is when stomach contents move back up into the esophagus (reflux). °· Repeated vomiting. °· An allergic-type reaction, especially caused by food allergies (eosinophilic esophagitis). °· Injury to the esophagus by swallowing large pills with or without water, or swallowing certain types of medicines. °· Swallowing (ingesting) harmful chemicals, such as household cleaning products. °· Heavy alcohol use. °· An infection of the esophagus. This most often occurs in people who have a weakened immune system. °· Radiation or chemotherapy treatment for cancer. °· Certain diseases such as sarcoidosis, Crohn disease, and scleroderma. °SYMPTOMS °Symptoms of this condition include: °· Difficult or painful swallowing. °· Pain with swallowing acidic liquids, such as citrus juices. °· Pain with burping. °· Chest pain. °· Difficulty breathing. °· Nausea. °· Vomiting. °· Pain in the abdomen. °· Weight loss. °· Ulcers in the mouth. °· Patches of white material in the mouth (candidiasis). °· Fever. °· Coughing up blood or vomiting blood. °· Stool that is black, tarry, or bright red. °DIAGNOSIS °Your health care provider will take a medical history and perform a physical exam. You may also have other tests, including: °· An endoscopy to examine your stomach and esophagus with a small camera. °· A test that measures the acidity level in your esophagus. °· A test that measures how much pressure is on your esophagus. °· A barium swallow or modified barium swallow to show the shape, size, and functioning of your esophagus. °· Allergy  tests. °TREATMENT °Treatment for this condition depends on the cause of your esophagitis. In some cases, steroids or other medicines may be given to help relieve your symptoms or to treat the underlying cause of your condition. You may have to make some lifestyle changes, such as: °· Avoiding alcohol. °· Quitting smoking. °· Changing your diet. °· Exercising. °· Changing your sleep habits and your sleep environment. °HOME CARE INSTRUCTIONS °Take these actions to decrease your discomfort and to help avoid complications. °Diet °· Follow a diet as recommended by your health care provider. This may involve avoiding foods and drinks such as: °¨ Coffee and tea (with or without caffeine). °¨ Drinks that contain alcohol. °¨ Energy drinks and sports drinks. °¨ Carbonated drinks or sodas. °¨ Chocolate and cocoa. °¨ Peppermint and mint flavorings. °¨ Garlic and onions. °¨ Horseradish. °¨ Spicy and acidic foods, including peppers, chili powder, curry powder, vinegar, hot sauces, and barbecue sauce. °¨ Citrus fruit juices and citrus fruits, such as oranges, lemons, and limes. °¨ Tomato-based foods, such as red sauce, chili, salsa, and pizza with red sauce. °¨ Fried and fatty foods, such as donuts, french fries, potato chips, and high-fat dressings. °¨ High-fat meats, such as hot dogs and fatty cuts of red and white meats, such as rib eye steak, sausage, ham, and bacon. °¨ High-fat dairy items, such as whole milk, butter, and cream cheese. °· Eat small, frequent meals instead of large meals. °· Avoid drinking large amounts of liquid with your meals. °· Avoid eating meals during the 2-3 hours before bedtime. °· Avoid lying down right after you eat. °· Do not exercise right after you eat. °· Avoid foods and drinks that seem to   make your symptoms worse. General Instructions  Pay attention to any changes in your symptoms.  Take over-the-counter and prescription medicines only as told by your health care provider. Do not take  aspirin, ibuprofen, or other NSAIDs unless your health care provider told you to do so.  If you have trouble taking pills, use a pill splitter to decrease the size of the pill. This will decrease the chance of the pill getting stuck or injuring your esophagus on the way down. Also, drink water after you take a pill.  Do not use any tobacco products, including cigarettes, chewing tobacco, and e-cigarettes. If you need help quitting, ask your health care provider.  Wear loose-fitting clothing. Do not wear anything tight around your waist that causes pressure on your abdomen.  Raise (elevate) the head of your bed about 6 inches (15 cm).  Try to reduce your stress, such as with yoga or meditation. If you need help reducing stress, ask your health care provider.  If you are overweight, reduce your weight to an amount that is healthy for you. Ask your health care provider for guidance about a safe weight loss goal.  Keep all follow-up visits as told by your health care provider. This is important. SEEK MEDICAL CARE IF:  You have new symptoms.  You have unexplained weight loss.  You have difficulty swallowing, or it hurts to swallow.  You have wheezing or a persistent cough.  Your symptoms do not improve with treatment.  You have frequent heartburn for more than two weeks. SEEK IMMEDIATE MEDICAL CARE IF:  You have severe pain in your arms, neck, jaw, teeth, or back.  You feel sweaty, dizzy, or light-headed.  You have chest pain or shortness of breath.  You vomit and your vomit looks like blood or coffee grounds.  Your stool is bloody or black.  You have a fever.  You cannot swallow, drink, or eat.   This information is not intended to replace advice given to you by your health care provider. Make sure you discuss any questions you have with your health care provider.   Document Released: 01/24/2005 Document Revised: 09/07/2015 Document Reviewed: 04/13/2015 Elsevier Interactive  Patient Education [employer].

## 2016-05-09 ENCOUNTER — Inpatient Hospital Stay: Payer: Medicare Other | Admitting: Internal Medicine

## 2016-05-10 ENCOUNTER — Encounter (HOSPITAL_BASED_OUTPATIENT_CLINIC_OR_DEPARTMENT_OTHER): Payer: BLUE CROSS/BLUE SHIELD | Attending: Internal Medicine

## 2016-05-10 ENCOUNTER — Telehealth: Payer: Self-pay | Admitting: *Deleted

## 2016-05-10 DIAGNOSIS — L97821 Non-pressure chronic ulcer of other part of left lower leg limited to breakdown of skin: Secondary | ICD-10-CM | POA: Insufficient documentation

## 2016-05-10 DIAGNOSIS — I83228 Varicose veins of left lower extremity with both ulcer of other part of lower extremity and inflammation: Secondary | ICD-10-CM | POA: Insufficient documentation

## 2016-05-10 DIAGNOSIS — L942 Calcinosis cutis: Secondary | ICD-10-CM | POA: Diagnosis not present

## 2016-05-10 NOTE — Telephone Encounter (Signed)
Transition Care Management Follow-up Telephone Call   Date discharged? 05/08/16   How have you been since you were released from the hospital? Pt states she is doing pretty good   Do you understand why you were in the hospital? YES   Do you understand the discharge instructions? YES   Where were you discharged to? Home   Items Reviewed:  Medications reviewed: YES  Allergies reviewed: YES  Dietary changes reviewed: YES  Referrals reviewed: YES, she states she is still waiting on GI appt   Functional Questionnaire:   Activities of Daily Living (ADLs):   She states she are independent in the following: ambulation, bathing and hygiene, feeding, continence, grooming, toileting and dressing States she require assistance with the following: ambulation   Any transportation issues/concerns?: NO   Any patient concerns? NO   Confirmed importance and date/time of follow-up visits scheduled YES, appt made 05/16/16  Provider Appointment booked with Dr. Alain Marion due to PCP on vacation  Confirmed with patient if condition begins to worsen call PCP or go to the ER.  Patient was given the office number and encouraged to call back with question or concerns.  : YES

## 2016-05-11 ENCOUNTER — Inpatient Hospital Stay: Payer: Medicare Other | Admitting: Internal Medicine

## 2016-05-11 NOTE — Progress Notes (Signed)
Quick Note:  This was done in the hospital. She was there for a GI bleed. Urine was not a good sample - not sure if she is having symptoms, but this was done in the hospital. She has a f/u next week ______

## 2016-05-16 ENCOUNTER — Other Ambulatory Visit (INDEPENDENT_AMBULATORY_CARE_PROVIDER_SITE_OTHER): Payer: BLUE CROSS/BLUE SHIELD

## 2016-05-16 DIAGNOSIS — I272 Other secondary pulmonary hypertension: Secondary | ICD-10-CM | POA: Diagnosis not present

## 2016-05-16 LAB — COMPREHENSIVE METABOLIC PANEL
ALT: 10 U/L (ref 0–35)
AST: 18 U/L (ref 0–37)
Albumin: 4.2 g/dL (ref 3.5–5.2)
Alkaline Phosphatase: 80 U/L (ref 39–117)
BUN: 52 mg/dL — ABNORMAL HIGH (ref 6–23)
CO2: 28 mEq/L (ref 19–32)
Calcium: 9.2 mg/dL (ref 8.4–10.5)
Chloride: 98 mEq/L (ref 96–112)
Creatinine, Ser: 1.48 mg/dL — ABNORMAL HIGH (ref 0.40–1.20)
GFR: 37.43 mL/min — ABNORMAL LOW (ref >60.00)
Glucose, Bld: 110 mg/dL — ABNORMAL HIGH (ref 70–99)
Potassium: 3.7 mEq/L (ref 3.5–5.1)
Sodium: 136 mEq/L (ref 135–145)
Total Bilirubin: 0.3 mg/dL (ref 0.2–1.2)
Total Protein: 7.2 g/dL (ref 6.0–8.3)

## 2016-05-16 LAB — CBC
HCT: 26 % — ABNORMAL LOW (ref 36.0–46.0)
Hemoglobin: 8.8 g/dL — ABNORMAL LOW (ref 12.0–15.0)
MCHC: 33.6 g/dL (ref 30.0–36.0)
MCV: 96.5 fl (ref 78.0–100.0)
Platelets: 481 10*3/uL — ABNORMAL HIGH (ref 150.0–400.0)
RBC: 2.7 Mil/uL — ABNORMAL LOW (ref 3.87–5.11)
RDW: 15.2 % (ref 11.5–15.5)
WBC: 8.5 10*3/uL (ref 4.0–10.5)

## 2016-05-17 ENCOUNTER — Telehealth (HOSPITAL_COMMUNITY): Payer: Self-pay

## 2016-05-17 DIAGNOSIS — I83228 Varicose veins of left lower extremity with both ulcer of other part of lower extremity and inflammation: Secondary | ICD-10-CM | POA: Diagnosis not present

## 2016-05-17 NOTE — Telephone Encounter (Signed)
Patient reporting she is out of sildenafil and has not yet received her new medication Adcirca to replace the Sildenafil. [name] RN currently awaiting PA process for [employer]. Advised the patient that we will be in touch with her in the next 24 hrs to address this issue further and plan to have her new Rx taken care of.  [name]

## 2016-05-18 ENCOUNTER — Ambulatory Visit (INDEPENDENT_AMBULATORY_CARE_PROVIDER_SITE_OTHER): Payer: BLUE CROSS/BLUE SHIELD | Admitting: Internal Medicine

## 2016-05-18 ENCOUNTER — Encounter: Payer: Self-pay | Admitting: Internal Medicine

## 2016-05-18 VITALS — BP 140/60 | HR 76 | Wt 135.0 lb

## 2016-05-18 DIAGNOSIS — I272 Other secondary pulmonary hypertension: Secondary | ICD-10-CM

## 2016-05-18 DIAGNOSIS — R202 Paresthesia of skin: Secondary | ICD-10-CM | POA: Diagnosis not present

## 2016-05-18 DIAGNOSIS — I2729 Other secondary pulmonary hypertension: Secondary | ICD-10-CM

## 2016-05-18 DIAGNOSIS — I10 Essential (primary) hypertension: Secondary | ICD-10-CM | POA: Diagnosis not present

## 2016-05-18 DIAGNOSIS — E876 Hypokalemia: Secondary | ICD-10-CM

## 2016-05-18 DIAGNOSIS — K922 Gastrointestinal hemorrhage, unspecified: Secondary | ICD-10-CM

## 2016-05-18 DIAGNOSIS — D500 Iron deficiency anemia secondary to blood loss (chronic): Secondary | ICD-10-CM

## 2016-05-18 NOTE — Progress Notes (Signed)
Pre visit review using our clinic review tool, if applicable. No additional management support is needed unless otherwise documented below in the visit note. 

## 2016-05-18 NOTE — Progress Notes (Signed)
Subjective:  [name], female    DOB: November 20, 1951  Age: 65 y.o. MRN: [id number]  CC: No chief complaint on file.   HPI [name] [name] presents for post-hosp f/u - upper GI bleed, anemia, dehydration (5/7-5/9) - hosp notes/data reviewed  "65 y.o. female with medical history significant of hypertension, scleroderma, non-Hodgkin lymphoma, melanoma, peripheral tibial occlusive disease, neuropathic foot ulcer, pulmonary hypertension, anemia. Patient presented to [facility] with fatigue and vomiting started the morning of this admission. Patient reported having coffee ground emesis, 3 episodes at home prior to coming to emergency room. Patient also reported melanotic stool. She is on aspirin and Plavix.  In ED, patient was hemodynamically stable. Hemoglobin was 8.8 on the admission but further drop noted in 24 hours, 6.8 on 05/07/2016. GI has seen the patient in consultation and plans for endoscopy 05/07/2016. Her EGD showed esophagitis which per GI is likely related to scleroderma.  Please note, on May 9 8 2017, patient more short of breath in respiratory distress. Chest x-ray showed unchanged cardiomegaly and chronic appearing interstitial coarsening. She was given one dose of Lasix and her respiratory status improved. No further respiratory distress.  Hospital Course:   Assessment & Plan:  Acute blood loss anemia / acute upper GI bleed / esophagitis likely related to scleroderma - Likely triggered by aspirin and Plavix which were placed on hold - Patient started on Protonix drip and Protonix 40 mg IV every 12 hours - She has received 1 unit of PRBC transfusion 05/07/2016 - Endoscopy done 05/07/2016 with findings of esophagitis and recommendation to continue twice-daily PPI - She will see GI on outpatient basis in one month after discharge  Hypernatremia  - Sodium 146 this morning, likely dehydration  - Sodium normalized with IV fluids  Occlusive peripheral  arterial disease - Continue aspirin and Plavix  Chronic kidney disease stage III - Baseline creatinine 1.69 about 6 months ago\ - Creatinine on this admission 1.5 and further normalized during this hospital stay  Hypokalemia - Secondary to GI losses - Continue to supplement  Dyslipidemia - Continue statin on discharge  Scleroderma, circumscribed or localized - Continue your prednisone 2.41m daily for now, resume once back from EGD - Continue oxygen at 2-3 L/min per home regimen   Pulmonary hypertension [place] with systemic disorder [place] Sees Dr. BLamonte Sakaiin clinic   DVT prophylaxis: SCDs bilaterally due to risk of bleeding Code Status: full code  Family Communication: Friend at the bedside this morning"   Outpatient Prescriptions Prior to Visit  Medication Sig Dispense Refill  . acidophilus (RISAQUAD) CAPS Take 1 capsule by mouth every morning.     .Marland Kitchenaspirin 81 MG tablet Take 1 tablet (81 mg total) by mouth every evening. 30 tablet   . atorvastatin (LIPITOR) 10 MG tablet Take 1 tablet (10 mg total) by mouth daily. 30 tablet 1  . BIOTIN PO Take 1 tablet by mouth daily.    . cholecalciferol (VITAMIN D) 1000 UNITS tablet Take 2,000 Units by mouth every evening.     . citalopram (CELEXA) 20 MG tablet Take 30 mg by mouth daily.    . clopidogrel (PLAVIX) 75 MG tablet take 1 tablet by mouth once daily 30 tablet 5  . fish oil-omega-3 fatty acids 1000 MG capsule Take 1 g by mouth daily with lunch.     . gabapentin (NEURONTIN) 300 MG capsule take 1 capsule by mouth every morning and 2 every evening (Patient taking differently: Take 300-600  mg by mouth 2 (two) times daily. take 1 capsule by mouth every morning and 2 every evening) 180 capsule 1  . Macitentan (OPSUMIT) 10 MG TABS Take 10 mg by mouth daily. 30 tablet 1  . moxifloxacin (VIGAMOX) 0.5 % ophthalmic solution Place 1 drop into both eyes 3 (three) times daily as needed (allergies).     . multivitamin (THERAGRAN) per tablet  Take 1 tablet by mouth daily.     . OXYGEN Inhale 2 L into the lungs continuous.    . pantoprazole (PROTONIX) 40 MG tablet Take 1 tablet (40 mg total) by mouth 2 (two) times daily. 60 tablet 1  . POLY-IRON 150 150 MG capsule take 1 capsule by mouth twice a day 60 capsule 2  . potassium chloride (K-DUR) 10 MEQ tablet Take 1 tablet (10 mEq total) by mouth daily. 30 tablet 1  . predniSONE (DELTASONE) 2.5 MG tablet Take 2.5 mg by mouth daily.  0  . spironolactone (ALDACTONE) 25 MG tablet Take 1 tablet (25 mg total) by mouth daily. 30 tablet 1  . Tadalafil, PAH, (ADCIRCA) 20 MG TABS Take 2 tablets (40 mg total) by mouth daily. 60 tablet 6  . torsemide (DEMADEX) 20 MG tablet Take 2 tablets (40 mg total) by mouth 2 (two) times daily. 120 tablet 1  . vitamin B-12 (CYANOCOBALAMIN) 500 MCG tablet Take 500 mcg by mouth every morning.     . sildenafil (REVATIO) 20 MG tablet Take 40 mg by mouth 3 (three) times daily. Reported on 05/18/2016  5   No facility-administered medications prior to visit.    ROS Review of Systems  Constitutional: Positive for fatigue. Negative for chills, activity change, appetite change and unexpected weight change.  HENT: Negative for congestion, mouth sores and sinus pressure.   Eyes: Negative for visual disturbance.  Respiratory: Positive for shortness of breath. Negative for cough and chest tightness.   Gastrointestinal: Negative for nausea, abdominal pain, diarrhea and blood in stool.  Genitourinary: Negative for frequency, difficulty urinating and vaginal pain.  Musculoskeletal: Positive for arthralgias. Negative for back pain and gait problem.  Skin: Negative for pallor and rash.  Neurological: Positive for weakness. Negative for dizziness, tremors, numbness and headaches.  Psychiatric/Behavioral: Negative for confusion and sleep disturbance.    Objective:  BP 140/60 mmHg  Pulse 76  Wt 135 lb (61.236 kg)  SpO2 91%  BP Readings from Last 3 Encounters:  05/18/16  140/60  05/08/16 115/55  05/02/16 90/52    Wt Readings from Last 3 Encounters:  05/18/16 135 lb (61.236 kg)  05/08/16 139 lb (63.05 kg)  05/02/16 138 lb (62.596 kg)    Physical Exam  Constitutional: She appears well-developed. No distress.  HENT:  Head: Normocephalic.  Right Ear: External ear normal.  Left Ear: External ear normal.  Nose: Nose normal.  Mouth/Throat: Oropharynx is clear and moist.  Eyes: Conjunctivae are normal. Pupils are equal, round, and reactive to light. Right eye exhibits no discharge. Left eye exhibits no discharge.  Neck: Normal range of motion. Neck supple. No JVD present. No tracheal deviation present. No thyromegaly present.  Cardiovascular: Normal rate, regular rhythm and normal heart sounds.   Pulmonary/Chest: No stridor. No respiratory distress. She has no wheezes.  Abdominal: Soft. Bowel sounds are normal. She exhibits no distension and no mass. There is no tenderness. There is no rebound and no guarding.  Musculoskeletal: She exhibits no edema or tenderness.  Lymphadenopathy:    She has no cervical adenopathy.  Neurological: She  displays normal reflexes. No cranial nerve deficit. She exhibits normal muscle tone. Coordination normal.  Skin: No rash noted. No erythema.  Psychiatric: She has a normal mood and affect. Her behavior is normal. Judgment and thought content normal.    Lab Results  Component Value Date   WBC 8.5 05/16/2016   HGB 8.8 Repeated and verified X2.* 05/16/2016   HCT 26.0 Repeated and verified X2.* 05/16/2016   PLT 481.0* 05/16/2016   GLUCOSE 110* 05/16/2016   CHOL 161 06/21/2015   TRIG 85.0 06/21/2015   HDL 59.20 06/21/2015   LDLCALC 85 06/21/2015   ALT 10 05/16/2016   AST 18 05/16/2016   NA 136 05/16/2016   K 3.7 05/16/2016   CL 98 05/16/2016   CREATININE 1.48* 05/16/2016   BUN 52* 05/16/2016   CO2 28 05/16/2016   TSH 1.67 05/02/2016   INR 0.96 05/29/2013    No results found.  Assessment & Plan:   There are  no diagnoses linked to this encounter. I am having Ms. Eilts maintain her multivitamin, vitamin B-12, acidophilus, fish oil-omega-3 fatty acids, cholecalciferol, moxifloxacin, OXYGEN, BIOTIN PO, clopidogrel, atorvastatin, gabapentin, Macitentan, potassium chloride, spironolactone, torsemide, POLY-IRON 150, citalopram, Tadalafil (PAH), sildenafil, predniSONE, pantoprazole, and aspirin.  No orders of the defined types were placed in this encounter.     Follow-up: No Follow-up on file.  Walker Kehr, MD

## 2016-05-21 ENCOUNTER — Ambulatory Visit (HOSPITAL_COMMUNITY)
Admission: RE | Admit: 2016-05-21 | Discharge: 2016-05-21 | Disposition: A | Discharge status: Home / Self Care | Payer: BLUE CROSS/BLUE SHIELD | Source: Ambulatory Visit | Attending: Emergency Medicine | Admitting: Emergency Medicine

## 2016-05-21 ENCOUNTER — Encounter (HOSPITAL_COMMUNITY): Payer: Self-pay

## 2016-05-21 DIAGNOSIS — K229 Disease of esophagus, unspecified: Secondary | ICD-10-CM | POA: Diagnosis not present

## 2016-05-21 DIAGNOSIS — J849 Interstitial pulmonary disease, unspecified: Secondary | ICD-10-CM | POA: Insufficient documentation

## 2016-05-21 DIAGNOSIS — I7090 Unspecified atherosclerosis: Secondary | ICD-10-CM | POA: Insufficient documentation

## 2016-05-21 DIAGNOSIS — I2510 Atherosclerotic heart disease of native coronary artery without angina pectoris: Secondary | ICD-10-CM | POA: Insufficient documentation

## 2016-05-23 NOTE — Telephone Encounter (Signed)
Late Entry:  Pt's Adcirca was approved on 05/18/16, ref # [id number] approved 05/14/16-12/30/38.    05/22/16 spoke w/pt she was aware med had been approved and states she had received it in the mail.

## 2016-05-30 ENCOUNTER — Telehealth (HOSPITAL_COMMUNITY): Payer: Self-pay | Admitting: *Deleted

## 2016-05-30 MED ORDER — SILDENAFIL CITRATE 20 MG PO TABS: 40.0000 mg | ORAL_TABLET | Freq: Three times a day (TID) | ORAL | Status: DC

## 2016-05-30 NOTE — Telephone Encounter (Signed)
Pt called c/o feeling lightheaded and increase fatigue since starting the Adcirca on 5/23.  VS have been stable BP 125/56 O2 95-97% HR 60's.  Discussed w/Dr Bensimhon, he recommends pt can switch back to Sildenafil 40 mg TID, rx sent to [facility], pt aware

## 2016-05-31 ENCOUNTER — Encounter (HOSPITAL_BASED_OUTPATIENT_CLINIC_OR_DEPARTMENT_OTHER): Payer: BLUE CROSS/BLUE SHIELD | Attending: Internal Medicine

## 2016-05-31 DIAGNOSIS — I83225 Varicose veins of left lower extremity with both ulcer other part of foot and inflammation: Secondary | ICD-10-CM | POA: Insufficient documentation

## 2016-05-31 DIAGNOSIS — L942 Calcinosis cutis: Secondary | ICD-10-CM | POA: Insufficient documentation

## 2016-05-31 DIAGNOSIS — Z7952 Long term (current) use of systemic steroids: Secondary | ICD-10-CM | POA: Insufficient documentation

## 2016-05-31 DIAGNOSIS — Z7982 Long term (current) use of aspirin: Secondary | ICD-10-CM | POA: Insufficient documentation

## 2016-05-31 DIAGNOSIS — Z79899 Other long term (current) drug therapy: Secondary | ICD-10-CM | POA: Insufficient documentation

## 2016-05-31 DIAGNOSIS — L97821 Non-pressure chronic ulcer of other part of left lower leg limited to breakdown of skin: Secondary | ICD-10-CM | POA: Diagnosis not present

## 2016-06-08 ENCOUNTER — Other Ambulatory Visit: Payer: BLUE CROSS/BLUE SHIELD

## 2016-06-08 ENCOUNTER — Telehealth: Payer: Self-pay | Admitting: Emergency Medicine

## 2016-06-08 ENCOUNTER — Other Ambulatory Visit (INDEPENDENT_AMBULATORY_CARE_PROVIDER_SITE_OTHER): Payer: BLUE CROSS/BLUE SHIELD

## 2016-06-08 DIAGNOSIS — R202 Paresthesia of skin: Secondary | ICD-10-CM

## 2016-06-08 DIAGNOSIS — D500 Iron deficiency anemia secondary to blood loss (chronic): Secondary | ICD-10-CM | POA: Diagnosis not present

## 2016-06-08 DIAGNOSIS — I272 Other secondary pulmonary hypertension: Secondary | ICD-10-CM | POA: Diagnosis not present

## 2016-06-08 LAB — CBC
HCT: 23.8 % — CL (ref 36.0–46.0)
Hemoglobin: 7.7 g/dL — CL (ref 12.0–15.0)
MCHC: 32.4 g/dL (ref 30.0–36.0)
MCV: 94.2 fl (ref 78.0–100.0)
Platelets: 362 10*3/uL (ref 150.0–400.0)
RBC: 2.53 Mil/uL — ABNORMAL LOW (ref 3.87–5.11)
RDW: 17.1 % — ABNORMAL HIGH (ref 11.5–15.5)
WBC: 9.1 10*3/uL (ref 4.0–10.5)

## 2016-06-08 LAB — VITAMIN B12: Vitamin B-12: 1055 pg/mL — ABNORMAL HIGH (ref 211–911)

## 2016-06-08 LAB — BASIC METABOLIC PANEL
BUN: 62 mg/dL — ABNORMAL HIGH (ref 6–23)
CO2: 27 mEq/L (ref 19–32)
Calcium: 8.9 mg/dL (ref 8.4–10.5)
Chloride: 93 mEq/L — ABNORMAL LOW (ref 96–112)
Creatinine, Ser: 1.84 mg/dL — ABNORMAL HIGH (ref 0.40–1.20)
GFR: 29.11 mL/min — ABNORMAL LOW (ref >60.00)
Glucose, Bld: 116 mg/dL — ABNORMAL HIGH (ref 70–99)
Potassium: 5.1 mEq/L (ref 3.5–5.1)
Sodium: 129 mEq/L — ABNORMAL LOW (ref 135–145)

## 2016-06-08 LAB — COMPREHENSIVE METABOLIC PANEL
ALT: 14 U/L (ref 0–35)
AST: 22 U/L (ref 0–37)
Albumin: 3.9 g/dL (ref 3.5–5.2)
Alkaline Phosphatase: 113 U/L (ref 39–117)
BUN: 61 mg/dL — ABNORMAL HIGH (ref 6–23)
CO2: 27 mEq/L (ref 19–32)
Calcium: 8.9 mg/dL (ref 8.4–10.5)
Chloride: 92 mEq/L — ABNORMAL LOW (ref 96–112)
Creatinine, Ser: 1.87 mg/dL — ABNORMAL HIGH (ref 0.40–1.20)
GFR: 28.57 mL/min — ABNORMAL LOW (ref >60.00)
Glucose, Bld: 116 mg/dL — ABNORMAL HIGH (ref 70–99)
Potassium: 5.1 mEq/L (ref 3.5–5.1)
Sodium: 129 mEq/L — ABNORMAL LOW (ref 135–145)
Total Bilirubin: 0.4 mg/dL (ref 0.2–1.2)
Total Protein: 7.1 g/dL (ref 6.0–8.3)

## 2016-06-08 NOTE — Telephone Encounter (Signed)
Ok noted  

## 2016-06-08 NOTE — Telephone Encounter (Signed)
Received Critical from lab. HGB 7.7. Amy has been informed

## 2016-06-13 ENCOUNTER — Ambulatory Visit: Payer: Medicare Other | Admitting: Family

## 2016-06-13 DIAGNOSIS — Z0289 Encounter for other administrative examinations: Secondary | ICD-10-CM

## 2016-06-14 DIAGNOSIS — I83225 Varicose veins of left lower extremity with both ulcer other part of foot and inflammation: Secondary | ICD-10-CM | POA: Diagnosis not present

## 2016-06-15 DIAGNOSIS — D509 Iron deficiency anemia, unspecified: Secondary | ICD-10-CM | POA: Diagnosis not present

## 2016-06-15 LAB — ABO/RH: ABO/RH(D): B POS

## 2016-06-18 ENCOUNTER — Encounter (HOSPITAL_COMMUNITY): Payer: Self-pay

## 2016-06-18 ENCOUNTER — Ambulatory Visit (HOSPITAL_COMMUNITY)
Admission: RE | Admit: 2016-06-18 | Discharge: 2016-06-18 | Disposition: A | Discharge status: Home / Self Care | Payer: BLUE CROSS/BLUE SHIELD | Source: Ambulatory Visit | Attending: Gastroenterology | Admitting: Gastroenterology

## 2016-06-18 DIAGNOSIS — D509 Iron deficiency anemia, unspecified: Secondary | ICD-10-CM | POA: Insufficient documentation

## 2016-06-18 LAB — PREPARE RBC (CROSSMATCH)

## 2016-06-18 MED ORDER — SODIUM CHLORIDE 0.9 % IV SOLN
Freq: Once | INTRAVENOUS | Status: AC
  Administered 2016-06-18: 09:00:00 via INTRAVENOUS

## 2016-06-18 NOTE — Discharge Instructions (Signed)
Blood Transfusion, Care After Refer to this sheet in the next few weeks. These instructions provide you with information about caring for yourself after your procedure. Your health care provider may also give you more specific instructions. Your treatment has been planned according to current medical practices, but problems sometimes occur. Call your health care provider if you have any problems or questions after your procedure. WHAT TO EXPECT AFTER THE PROCEDURE After your procedure, it is common to have:  Bruising and soreness at the IV site.  Chills or fever.  Headache. HOME CARE INSTRUCTIONS  Take medicines only as directed by your health care provider. Ask your health care provider if you can take an over-the-counter pain reliever in case you have a fever or headache a day or two after your transfusion.  Return to your normal activities as directed by your health care provider. SEEK MEDICAL CARE IF:   You develop redness or irritation at your IV site.  You have persistent fever, chills, or headache.  Your urine is darker than normal.  Your urine turns pink, red, or brown.   The white part of your eye turns yellow (jaundice).   You feel weak after doing your normal activities.  SEEK IMMEDIATE MEDICAL CARE IF:   You have trouble breathing.  You have fever and chills along with:  Anxiety.  Chest or back pain.  Flushed skin.  Clammy skin.  A rapid heartbeat.  Nausea.   This information is not intended to replace advice given to you by your health care provider. Make sure you discuss any questions you have with your health care provider.   Document Released: 01/07/2015 Document Reviewed: 01/07/2015 Elsevier Interactive Patient Education [employer].

## 2016-06-18 NOTE — Progress Notes (Signed)
Patient received two units PRBC per order as outpatient today. Tolerated well.

## 2016-06-19 ENCOUNTER — Encounter: Payer: Self-pay | Admitting: Internal Medicine

## 2016-06-19 LAB — TYPE AND SCREEN
ABO/RH(D): B POS
Antibody Screen: NEGATIVE
Unit division: 0
Unit division: 0

## 2016-06-21 DIAGNOSIS — I83225 Varicose veins of left lower extremity with both ulcer other part of foot and inflammation: Secondary | ICD-10-CM | POA: Diagnosis not present

## 2016-06-22 ENCOUNTER — Other Ambulatory Visit: Payer: Self-pay | Admitting: Internal Medicine

## 2016-06-22 ENCOUNTER — Other Ambulatory Visit (INDEPENDENT_AMBULATORY_CARE_PROVIDER_SITE_OTHER): Payer: BLUE CROSS/BLUE SHIELD

## 2016-06-22 DIAGNOSIS — G40219 Localization-related (focal) (partial) symptomatic epilepsy and epileptic syndromes with complex partial seizures, intractable, without status epilepticus: Secondary | ICD-10-CM

## 2016-06-22 DIAGNOSIS — D62 Acute posthemorrhagic anemia: Secondary | ICD-10-CM

## 2016-06-22 DIAGNOSIS — Z1159 Encounter for screening for other viral diseases: Secondary | ICD-10-CM

## 2016-06-22 LAB — CBC
HCT: 35.7 % — ABNORMAL LOW (ref 36.0–46.0)
Hemoglobin: 11.5 g/dL — ABNORMAL LOW (ref 12.0–15.0)
MCHC: 32.3 g/dL (ref 30.0–36.0)
MCV: 90.4 fl (ref 78.0–100.0)
Platelets: 424 10*3/uL — ABNORMAL HIGH (ref 150.0–400.0)
RBC: 3.95 Mil/uL (ref 3.87–5.11)
RDW: 18.1 % — ABNORMAL HIGH (ref 11.5–15.5)
WBC: 7.1 10*3/uL (ref 4.0–10.5)

## 2016-06-23 LAB — HEPATITIS C ANTIBODY: HCV Ab: NEGATIVE

## 2016-06-26 NOTE — Assessment & Plan Note (Signed)
Labs Hospital Labs/records reviewed

## 2016-06-26 NOTE — Assessment & Plan Note (Signed)
BP Readings from Last 3 Encounters:  06/18/16 131/52  05/18/16 140/60  05/08/16 115/55  On Spironolactone

## 2016-06-26 NOTE — Assessment & Plan Note (Signed)
F/u w/Dr Lamonte Sakai

## 2016-06-26 NOTE — Assessment & Plan Note (Signed)
Hospital labs reviewed Spironolactone

## 2016-06-29 ENCOUNTER — Encounter (HOSPITAL_COMMUNITY): Payer: Self-pay

## 2016-06-29 ENCOUNTER — Encounter: Payer: Self-pay | Admitting: Internal Medicine

## 2016-06-29 ENCOUNTER — Ambulatory Visit (INDEPENDENT_AMBULATORY_CARE_PROVIDER_SITE_OTHER): Payer: BLUE CROSS/BLUE SHIELD | Admitting: Internal Medicine

## 2016-06-29 ENCOUNTER — Encounter (HOSPITAL_COMMUNITY)
Admission: RE | Admit: 2016-06-29 | Discharge: 2016-06-29 | Disposition: A | Discharge status: Home / Self Care | Payer: BLUE CROSS/BLUE SHIELD | Source: Ambulatory Visit | Attending: Gastroenterology | Admitting: Gastroenterology

## 2016-06-29 VITALS — BP 120/64 | HR 76 | Temp 97.8°F | Resp 14 | Ht 67.0 in | Wt 137.0 lb

## 2016-06-29 DIAGNOSIS — D509 Iron deficiency anemia, unspecified: Secondary | ICD-10-CM | POA: Diagnosis not present

## 2016-06-29 DIAGNOSIS — Z0000 Encounter for general adult medical examination without abnormal findings: Secondary | ICD-10-CM | POA: Diagnosis not present

## 2016-06-29 DIAGNOSIS — I739 Peripheral vascular disease, unspecified: Secondary | ICD-10-CM

## 2016-06-29 MED ORDER — SODIUM CHLORIDE 0.9 % IV SOLN
Freq: Once | INTRAVENOUS | Status: AC
  Administered 2016-06-29: 08:00:00 via INTRAVENOUS

## 2016-06-29 MED ORDER — SODIUM CHLORIDE 0.9 % IV SOLN
510.0000 mg | INTRAVENOUS | Status: DC
  Administered 2016-06-29: 510 mg via INTRAVENOUS
  Filled 2016-06-29: qty 17

## 2016-06-29 NOTE — Discharge Instructions (Signed)
Ferumoxytol injection What is this medicine? FERUMOXYTOL is an iron complex. Iron is used to make healthy red blood cells, which carry oxygen and nutrients throughout the body. This medicine is used to treat iron deficiency anemia in people with chronic kidney disease. This medicine may be used for other purposes; ask your health care provider or pharmacist if you have questions. What should I tell my health care provider before I take this medicine? They need to know if you have any of these conditions: -anemia not caused by low iron levels -high levels of iron in the blood -magnetic resonance imaging (MRI) test scheduled -an unusual or allergic reaction to iron, other medicines, foods, dyes, or preservatives -pregnant or trying to get pregnant -breast-feeding How should I use this medicine? This medicine is for injection into a vein. It is given by a health care professional in a hospital or clinic setting. Talk to your pediatrician regarding the use of this medicine in children. Special care may be needed. Overdosage: If you think you have taken too much of this medicine contact a poison control center or emergency room at once. NOTE: This medicine is only for you. Do not share this medicine with others. What if I miss a dose? It is important not to miss your dose. Call your doctor or health care professional if you are unable to keep an appointment. What may interact with this medicine? This medicine may interact with the following medications: -other iron products This list may not describe all possible interactions. Give your health care provider a list of all the medicines, herbs, non-prescription drugs, or dietary supplements you use. Also tell them if you smoke, drink alcohol, or use illegal drugs. Some items may interact with your medicine. What should I watch for while using this medicine? Visit your doctor or healthcare professional regularly. Tell your doctor or healthcare  professional if your symptoms do not start to get better or if they get worse. You may need blood work done while you are taking this medicine. You may need to follow a special diet. Talk to your doctor. Foods that contain iron include: whole grains/cereals, dried fruits, beans, or peas, leafy green vegetables, and organ meats (liver, kidney). What side effects may I notice from receiving this medicine? Side effects that you should report to your doctor or health care professional as soon as possible: -allergic reactions like skin rash, itching or hives, swelling of the face, lips, or tongue -breathing problems -changes in blood pressure -feeling faint or lightheaded, falls -fever or chills -flushing, sweating, or hot feelings -swelling of the ankles or feet Side effects that usually do not require medical attention (Report these to your doctor or health care professional if they continue or are bothersome.): -diarrhea -headache -nausea, vomiting -stomach pain This list may not describe all possible side effects. Call your doctor for medical advice about side effects. You may report side effects to FDA at 1-800-FDA-1088. Where should I keep my medicine? This drug is given in a hospital or clinic and will not be stored at home. NOTE: This sheet is a summary. It may not cover all possible information. If you have questions about this medicine, talk to your doctor, pharmacist, or health care provider.    2016, Elsevier/Gold Standard. (2012-08-01 15:23:36)

## 2016-06-29 NOTE — Assessment & Plan Note (Signed)
Recent labs reviewed with her. Colonoscopy up to date, immunizations are up to date. Counseled her on medication safety and mole surveillance. Given 10 year screening recommendations.

## 2016-06-29 NOTE — Progress Notes (Signed)
Pre visit review using our clinic review tool, if applicable. No additional management support is needed unless otherwise documented below in the visit note.

## 2016-06-29 NOTE — Patient Instructions (Signed)
Something we could try for the pain in the feet is called cymbalta (duloxetine) which is an atypical antidepressant that sometimes helps with nerve pain.  We would add on the low dose of this with your celexa and if you noticed a benefit at 2 weeks we would increase the cymbalta and decide if we should wean the celexa down. It is okay to be on both if you need both. If it does not help we would stop it.   Health Maintenance, Female Adopting a healthy lifestyle and getting preventive care can go a long way to promote health and wellness. Talk with your health care provider about what schedule of regular examinations is right for you. This is a good chance for you to check in with your provider about disease prevention and staying healthy. In between checkups, there are plenty of things you can do on your own. Experts have done a lot of research about which lifestyle changes and preventive measures are most likely to keep you healthy. Ask your health care provider for more information. WEIGHT AND DIET  Eat a healthy diet  Be sure to include plenty of vegetables, fruits, low-fat dairy products, and lean protein.  Do not eat a lot of foods high in solid fats, added sugars, or salt.  Get regular exercise. This is one of the most important things you can do for your health.  Most adults should exercise for at least 150 minutes each week. The exercise should increase your heart rate and make you sweat (moderate-intensity exercise).  Most adults should also do strengthening exercises at least twice a week. This is in addition to the moderate-intensity exercise.  Maintain a healthy weight  Body mass index (BMI) is a measurement that can be used to identify possible weight problems. It estimates body fat based on height and weight. Your health care provider can help determine your BMI and help you achieve or maintain a healthy weight.  For females 77 years of age and older:   A BMI below 18.5 is  considered underweight.  A BMI of 18.5 to 24.9 is normal.  A BMI of 25 to 29.9 is considered overweight.  A BMI of 30 and above is considered obese.  Watch levels of cholesterol and blood lipids  You should start having your blood tested for lipids and cholesterol at 67 years of age, then have this test every 5 years.  You may need to have your cholesterol levels checked more often if:  Your lipid or cholesterol levels are high.  You are older than 67 years of age.  You are at high risk for heart disease.  CANCER SCREENING   Lung Cancer  Lung cancer screening is recommended for adults 7-54 years old who are at high risk for lung cancer because of a history of smoking.  A yearly low-dose CT scan of the lungs is recommended for people who:  Currently smoke.  Have quit within the past 15 years.  Have at least a 30-pack-year history of smoking. A pack year is smoking an average of one pack of cigarettes a day for 1 year.  Yearly screening should continue until it has been 15 years since you quit.  Yearly screening should stop if you develop a health problem that would prevent you from having lung cancer treatment.  Breast Cancer  Practice breast self-awareness. This means understanding how your breasts normally appear and feel.  It also means doing regular breast self-exams. Let your health care provider  know about any changes, no matter how small.  If you are in your 20s or 30s, you should have a clinical breast exam (CBE) by a health care provider every 1-3 years as part of a regular health exam.  If you are 31 or older, have a CBE every year. Also consider having a breast X-ray (mammogram) every year.  If you have a family history of breast cancer, talk to your health care provider about genetic screening.  If you are at high risk for breast cancer, talk to your health care provider about having an MRI and a mammogram every year.  Breast cancer gene (BRCA)  assessment is recommended for women who have family members with BRCA-related cancers. BRCA-related cancers include:  Breast.  Ovarian.  Tubal.  Peritoneal cancers.  Results of the assessment will determine the need for genetic counseling and BRCA1 and BRCA2 testing. Cervical Cancer Your health care provider may recommend that you be screened regularly for cancer of the pelvic organs (ovaries, uterus, and vagina). This screening involves a pelvic examination, including checking for microscopic changes to the surface of your cervix (Pap test). You may be encouraged to have this screening done every 3 years, beginning at age 80.  For women ages 78-65, health care providers may recommend pelvic exams and Pap testing every 3 years, or they may recommend the Pap and pelvic exam, combined with testing for human papilloma virus (HPV), every 5 years. Some types of HPV increase your risk of cervical cancer. Testing for HPV may also be done on women of any age with unclear Pap test results.  Other health care providers may not recommend any screening for nonpregnant women who are considered low risk for pelvic cancer and who do not have symptoms. Ask your health care provider if a screening pelvic exam is right for you.  If you have had past treatment for cervical cancer or a condition that could lead to cancer, you need Pap tests and screening for cancer for at least 20 years after your treatment. If Pap tests have been discontinued, your risk factors (such as having a new sexual partner) need to be reassessed to determine if screening should resume. Some women have medical problems that increase the chance of getting cervical cancer. In these cases, your health care provider may recommend more frequent screening and Pap tests. Colorectal Cancer  This type of cancer can be detected and often prevented.  Routine colorectal cancer screening usually begins at 67 years of age and continues through 67 years  of age.  Your health care provider may recommend screening at an earlier age if you have risk factors for colon cancer.  Your health care provider may also recommend using home test kits to check for hidden blood in the stool.  A small camera at the end of a tube can be used to examine your colon directly (sigmoidoscopy or colonoscopy). This is done to check for the earliest forms of colorectal cancer.  Routine screening usually begins at age 71.  Direct examination of the colon should be repeated every 5-10 years through 67 years of age. However, you may need to be screened more often if early forms of precancerous polyps or small growths are found. Skin Cancer  Check your skin from head to toe regularly.  Tell your health care provider about any new moles or changes in moles, especially if there is a change in a mole's shape or color.  Also tell your health care provider if  you have a mole that is larger than the size of a pencil eraser.  Always use sunscreen. Apply sunscreen liberally and repeatedly throughout the day.  Protect yourself by wearing long sleeves, pants, a wide-brimmed hat, and sunglasses whenever you are outside. HEART DISEASE, DIABETES, AND HIGH BLOOD PRESSURE   High blood pressure causes heart disease and increases the risk of stroke. High blood pressure is more likely to develop in:  People who have blood pressure in the high end of the normal range (130-139/85-89 mm Hg).  People who are overweight or obese.  People who are African American.  If you are 42-32 years of age, have your blood pressure checked every 3-5 years. If you are 11 years of age or older, have your blood pressure checked every year. You should have your blood pressure measured twice--once when you are at a hospital or clinic, and once when you are not at a hospital or clinic. Record the average of the two measurements. To check your blood pressure when you are not at a hospital or clinic, you  can use:  An automated blood pressure machine at a pharmacy.  A home blood pressure monitor.  If you are between 24 years and 58 years old, ask your health care provider if you should take aspirin to prevent strokes.  Have regular diabetes screenings. This involves taking a blood sample to check your fasting blood sugar level.  If you are at a normal weight and have a low risk for diabetes, have this test once every three years after 67 years of age.  If you are overweight and have a high risk for diabetes, consider being tested at a younger age or more often. PREVENTING INFECTION  Hepatitis B  If you have a higher risk for hepatitis B, you should be screened for this virus. You are considered at high risk for hepatitis B if:  You were born in a country where hepatitis B is common. Ask your health care provider which countries are considered high risk.  Your parents were born in a high-risk country, and you have not been immunized against hepatitis B (hepatitis B vaccine).  You have HIV or AIDS.  You use needles to inject street drugs.  You live with someone who has hepatitis B.  You have had sex with someone who has hepatitis B.  You get hemodialysis treatment.  You take certain medicines for conditions, including cancer, organ transplantation, and autoimmune conditions. Hepatitis C  Blood testing is recommended for:  Everyone born from [birth year] through 1967.  Anyone with known risk factors for hepatitis C. Sexually transmitted infections (STIs)  You should be screened for sexually transmitted infections (STIs) including gonorrhea and chlamydia if:  You are sexually active and are younger than 67 years of age.  You are older than 67 years of age and your health care provider tells you that you are at risk for this type of infection.  Your sexual activity has changed since you were last screened and you are at an increased risk for chlamydia or gonorrhea. Ask your health  care provider if you are at risk.  If you do not have HIV, but are at risk, it may be recommended that you take a prescription medicine daily to prevent HIV infection. This is called pre-exposure prophylaxis (PrEP). You are considered at risk if:  You are sexually active and do not regularly use condoms or know the HIV status of your partner(s).  You take drugs by injection.  You are sexually active with a partner who has HIV. Talk with your health care provider about whether you are at high risk of being infected with HIV. If you choose to begin PrEP, you should first be tested for HIV. You should then be tested every 3 months for as long as you are taking PrEP.  PREGNANCY   If you are premenopausal and you may become pregnant, ask your health care provider about preconception counseling.  If you may become pregnant, take 400 to 800 micrograms (mcg) of folic acid every day.  If you want to prevent pregnancy, talk to your health care provider about birth control (contraception). OSTEOPOROSIS AND MENOPAUSE   Osteoporosis is a disease in which the bones lose minerals and strength with aging. This can result in serious bone fractures. Your risk for osteoporosis can be identified using a bone density scan.  If you are 13 years of age or older, or if you are at risk for osteoporosis and fractures, ask your health care provider if you should be screened.  Ask your health care provider whether you should take a calcium or vitamin D supplement to lower your risk for osteoporosis.  Menopause may have certain physical symptoms and risks.  Hormone replacement therapy may reduce some of these symptoms and risks. Talk to your health care provider about whether hormone replacement therapy is right for you.  HOME CARE INSTRUCTIONS   Schedule regular health, dental, and eye exams.  Stay current with your immunizations.   Do not use any tobacco products including cigarettes, chewing tobacco, or  electronic cigarettes.  If you are pregnant, do not drink alcohol.  If you are breastfeeding, limit how much and how often you drink alcohol.  Limit alcohol intake to no more than 1 drink per day for nonpregnant women. One drink equals 12 ounces of beer, 5 ounces of wine, or 1 ounces of hard liquor.  Do not use street drugs.  Do not share needles.  Ask your health care provider for help if you need support or information about quitting drugs.  Tell your health care provider if you often feel depressed.  Tell your health care provider if you have ever been abused or do not feel safe at home.   This information is not intended to replace advice given to you by your health care provider. Make sure you discuss any questions you have with your health care provider.   Document Released: 07/02/2011 Document Revised: 01/07/2015 Document Reviewed: 11/18/2013 Elsevier Interactive Patient Education [employer].

## 2016-06-29 NOTE — Progress Notes (Signed)
Subjective:    [name], female    DOB: 1951-04-21, 65 y.o.   MRN: [id number]  HPI Here for medicare wellness, no new complaints. Please see A/P for status and treatment of chronic medical problems.   Diet: heart healthy Physical activity: sedentary Depression/mood screen: negative Hearing: intact to whispered voice Visual acuity: grossly normal, performs annual eye exam  ADLs: capable Fall risk: none Home safety: good Cognitive evaluation: intact to orientation, naming, recall and repetition EOL planning: adv directives discussed, in place  I have personally reviewed and have noted 1. The patient's medical and social history - reviewed today no changes 2. Their use of alcohol, tobacco or illicit drugs 3. Their current medications and supplements 4. The patient's functional ability including ADL's, fall risks, home safety risks and hearing or visual impairment. 5. Diet and physical activities 6. Evidence for depression or mood disorders 7. Care team reviewed and updated (available in snapshot)  Review of Systems  Constitutional: Negative for fever, chills, activity change and fatigue.  HENT: Negative for ear discharge, ear pain, sinus pressure, sore throat and trouble swallowing.   Eyes: Negative.   Respiratory: Positive for shortness of breath. Negative for cough, chest tightness and wheezing.   Cardiovascular: Negative for chest pain, palpitations and leg swelling.  Gastrointestinal: Negative for nausea, vomiting, abdominal pain, diarrhea, constipation and abdominal distention.  Musculoskeletal: Negative.   Skin: Positive for wound.  Neurological: Negative.   Psychiatric/Behavioral: Negative for behavioral problems, sleep disturbance, dysphoric mood, decreased concentration and agitation.      Objective:   Physical Exam  Constitutional: She is oriented to person, place, and time. She appears well-developed and well-nourished. No distress.  HENT:    Head: Normocephalic and atraumatic.  Eyes: EOM are normal.  Neck: Normal range of motion.  Cardiovascular: Normal rate and regular rhythm.   Pulmonary/Chest: Effort normal and breath sounds normal. No respiratory distress. She has no wheezes. She has no rales.  Wearing oxygen  Abdominal: Soft. Bowel sounds are normal. She exhibits no distension. There is no tenderness. There is no rebound.  Musculoskeletal: She exhibits no edema.  Neurological: She is alert and oriented to person, place, and time. Coordination normal.  Skin: Skin is warm.  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   Psychiatric: She has a normal mood and affect.   Filed Vitals:   06/29/16 1314  BP: 120/64  Pulse: 76  Temp: 97.8 F (36.6 C)  TempSrc: Oral  Resp: 14  Height: 5' 7" (1.702 m)  Weight: 137 lb (62.143 kg)      Assessment & Plan:

## 2016-06-29 NOTE — Progress Notes (Signed)
D/c instructions given on Feraheme.  Next appointment for 07/06/16 given to pt for next feraheme infusion.  Pt tolerated this 1st feraheme infusion well.

## 2016-07-05 ENCOUNTER — Encounter (HOSPITAL_BASED_OUTPATIENT_CLINIC_OR_DEPARTMENT_OTHER): Payer: BLUE CROSS/BLUE SHIELD | Attending: Internal Medicine

## 2016-07-05 DIAGNOSIS — L97511 Non-pressure chronic ulcer of other part of right foot limited to breakdown of skin: Secondary | ICD-10-CM | POA: Diagnosis not present

## 2016-07-05 DIAGNOSIS — L97812 Non-pressure chronic ulcer of other part of right lower leg with fat layer exposed: Secondary | ICD-10-CM | POA: Diagnosis not present

## 2016-07-05 DIAGNOSIS — L97311 Non-pressure chronic ulcer of right ankle limited to breakdown of skin: Secondary | ICD-10-CM | POA: Insufficient documentation

## 2016-07-06 ENCOUNTER — Encounter (HOSPITAL_COMMUNITY): Payer: Self-pay

## 2016-07-06 ENCOUNTER — Encounter (HOSPITAL_COMMUNITY)
Admission: RE | Admit: 2016-07-06 | Discharge: 2016-07-06 | Disposition: A | Discharge status: Home / Self Care | Payer: BLUE CROSS/BLUE SHIELD | Source: Ambulatory Visit | Attending: Gastroenterology | Admitting: Gastroenterology

## 2016-07-06 DIAGNOSIS — D509 Iron deficiency anemia, unspecified: Secondary | ICD-10-CM | POA: Insufficient documentation

## 2016-07-06 MED ORDER — SODIUM CHLORIDE 0.9 % IV SOLN
Freq: Once | INTRAVENOUS | Status: AC
  Administered 2016-07-06: 13:00:00 via INTRAVENOUS

## 2016-07-06 MED ORDER — SODIUM CHLORIDE 0.9 % IV SOLN
510.0000 mg | INTRAVENOUS | Status: AC
  Administered 2016-07-06: 510 mg via INTRAVENOUS
  Filled 2016-07-06: qty 17

## 2016-07-06 NOTE — Progress Notes (Signed)
Uneventful infusion of #2/2 Feraheme 528m in this series. Pt discharged ambulatory unaccompanied to elevator to meet husband at car

## 2016-07-10 ENCOUNTER — Telehealth: Payer: Self-pay | Admitting: Vascular Surgery

## 2016-07-10 NOTE — Telephone Encounter (Signed)
Spoke to pt to sch appt 08/14/16; lab at [facility] at 1:45.

## 2016-07-10 NOTE — Telephone Encounter (Signed)
-----  Message from Rica Records, RN sent at 07/10/2016  9:06 AM EDT ----- Regarding: RE: referral or follow up Yes..OK for her to be a follow up but I would schedule a venous reflux study.  (bilateral if swelling is in both legs)   ----- Message -----    From: Georgiann Mccoy    Sent: 07/06/2016   3:30 PM      To: Georgetta Haber Rankin, RN Subject: referral or follow up                          Hi Sonya,  So this pt is being referred here as a new pt for swelling in his legs. However, he was a laser abl pt in 2016.   Should this be a follow up for that?  Thank you, Selinda Eon

## 2016-07-13 DIAGNOSIS — L97812 Non-pressure chronic ulcer of other part of right lower leg with fat layer exposed: Secondary | ICD-10-CM | POA: Diagnosis not present

## 2016-07-24 ENCOUNTER — Encounter: Payer: BLUE CROSS/BLUE SHIELD | Admitting: Internal Medicine

## 2016-07-25 ENCOUNTER — Encounter: Payer: Self-pay | Admitting: Internal Medicine

## 2016-07-25 ENCOUNTER — Other Ambulatory Visit: Payer: Self-pay | Admitting: Internal Medicine

## 2016-07-26 ENCOUNTER — Other Ambulatory Visit (INDEPENDENT_AMBULATORY_CARE_PROVIDER_SITE_OTHER): Payer: BLUE CROSS/BLUE SHIELD

## 2016-07-26 DIAGNOSIS — I272 Other secondary pulmonary hypertension: Secondary | ICD-10-CM

## 2016-07-26 DIAGNOSIS — L97812 Non-pressure chronic ulcer of other part of right lower leg with fat layer exposed: Secondary | ICD-10-CM | POA: Diagnosis not present

## 2016-07-26 LAB — COMPREHENSIVE METABOLIC PANEL
ALT: 21 U/L (ref 0–35)
AST: 27 U/L (ref 0–37)
Albumin: 4.4 g/dL (ref 3.5–5.2)
Alkaline Phosphatase: 120 U/L — ABNORMAL HIGH (ref 39–117)
BUN: 75 mg/dL — ABNORMAL HIGH (ref 6–23)
CO2: 29 mEq/L (ref 19–32)
Calcium: 10 mg/dL (ref 8.4–10.5)
Chloride: 94 mEq/L — ABNORMAL LOW (ref 96–112)
Creatinine, Ser: 1.68 mg/dL — ABNORMAL HIGH (ref 0.40–1.20)
GFR: 32.32 mL/min — ABNORMAL LOW (ref >60.00)
Glucose, Bld: 96 mg/dL (ref 70–99)
Potassium: 4 mEq/L (ref 3.5–5.1)
Sodium: 136 mEq/L (ref 135–145)
Total Bilirubin: 0.4 mg/dL (ref 0.2–1.2)
Total Protein: 8.2 g/dL (ref 6.0–8.3)

## 2016-07-26 LAB — CBC
HCT: 35.1 % — ABNORMAL LOW (ref 36.0–46.0)
Hemoglobin: 11.6 g/dL — ABNORMAL LOW (ref 12.0–15.0)
MCHC: 32.9 g/dL (ref 30.0–36.0)
MCV: 93.5 fl (ref 78.0–100.0)
Platelets: 315 10*3/uL (ref 150.0–400.0)
RBC: 3.76 Mil/uL — ABNORMAL LOW (ref 3.87–5.11)
RDW: 21.6 % — ABNORMAL HIGH (ref 11.5–15.5)
WBC: 6.5 10*3/uL (ref 4.0–10.5)

## 2016-07-28 ENCOUNTER — Other Ambulatory Visit: Payer: Self-pay | Admitting: Internal Medicine

## 2016-07-29 ENCOUNTER — Encounter (HOSPITAL_COMMUNITY): Payer: Self-pay | Admitting: Emergency Medicine

## 2016-07-29 ENCOUNTER — Inpatient Hospital Stay (HOSPITAL_COMMUNITY)
Admission: EM | Admit: 2016-07-29 | Discharge: 2016-08-01 | DRG: 378 | Disposition: A | Discharge status: Home / Self Care | Payer: BLUE CROSS/BLUE SHIELD | Attending: Internal Medicine | Admitting: Internal Medicine

## 2016-07-29 DIAGNOSIS — I739 Peripheral vascular disease, unspecified: Secondary | ICD-10-CM | POA: Diagnosis present

## 2016-07-29 DIAGNOSIS — K922 Gastrointestinal hemorrhage, unspecified: Secondary | ICD-10-CM | POA: Diagnosis not present

## 2016-07-29 DIAGNOSIS — N183 Chronic kidney disease, stage 3 unspecified: Secondary | ICD-10-CM

## 2016-07-29 DIAGNOSIS — Z7902 Long term (current) use of antithrombotics/antiplatelets: Secondary | ICD-10-CM

## 2016-07-29 DIAGNOSIS — R55 Syncope and collapse: Secondary | ICD-10-CM

## 2016-07-29 DIAGNOSIS — M349 Systemic sclerosis, unspecified: Secondary | ICD-10-CM

## 2016-07-29 DIAGNOSIS — I2729 Other secondary pulmonary hypertension: Secondary | ICD-10-CM | POA: Diagnosis present

## 2016-07-29 DIAGNOSIS — N184 Chronic kidney disease, stage 4 (severe): Secondary | ICD-10-CM | POA: Diagnosis present

## 2016-07-29 DIAGNOSIS — Z22322 Carrier or suspected carrier of Methicillin resistant Staphylococcus aureus: Secondary | ICD-10-CM

## 2016-07-29 DIAGNOSIS — Z807 Family history of other malignant neoplasms of lymphoid, hematopoietic and related tissues: Secondary | ICD-10-CM

## 2016-07-29 DIAGNOSIS — E785 Hyperlipidemia, unspecified: Secondary | ICD-10-CM | POA: Diagnosis present

## 2016-07-29 DIAGNOSIS — D62 Acute posthemorrhagic anemia: Secondary | ICD-10-CM | POA: Diagnosis present

## 2016-07-29 DIAGNOSIS — I272 Other secondary pulmonary hypertension: Secondary | ICD-10-CM | POA: Diagnosis present

## 2016-07-29 DIAGNOSIS — Z888 Allergy status to other drugs, medicaments and biological substances status: Secondary | ICD-10-CM

## 2016-07-29 DIAGNOSIS — Z79899 Other long term (current) drug therapy: Secondary | ICD-10-CM

## 2016-07-29 DIAGNOSIS — Z9981 Dependence on supplemental oxygen: Secondary | ICD-10-CM

## 2016-07-29 DIAGNOSIS — Z7982 Long term (current) use of aspirin: Secondary | ICD-10-CM

## 2016-07-29 DIAGNOSIS — E876 Hypokalemia: Secondary | ICD-10-CM | POA: Diagnosis not present

## 2016-07-29 DIAGNOSIS — Z881 Allergy status to other antibiotic agents status: Secondary | ICD-10-CM

## 2016-07-29 DIAGNOSIS — R9431 Abnormal electrocardiogram [ECG] [EKG]: Secondary | ICD-10-CM

## 2016-07-29 DIAGNOSIS — F32A Depression, unspecified: Secondary | ICD-10-CM | POA: Diagnosis present

## 2016-07-29 DIAGNOSIS — I129 Hypertensive chronic kidney disease with stage 1 through stage 4 chronic kidney disease, or unspecified chronic kidney disease: Secondary | ICD-10-CM | POA: Diagnosis present

## 2016-07-29 DIAGNOSIS — Z880 Allergy status to penicillin: Secondary | ICD-10-CM

## 2016-07-29 DIAGNOSIS — F329 Major depressive disorder, single episode, unspecified: Secondary | ICD-10-CM | POA: Diagnosis present

## 2016-07-29 DIAGNOSIS — L97509 Non-pressure chronic ulcer of other part of unspecified foot with unspecified severity: Secondary | ICD-10-CM | POA: Diagnosis present

## 2016-07-29 DIAGNOSIS — Z85820 Personal history of malignant melanoma of skin: Secondary | ICD-10-CM

## 2016-07-29 DIAGNOSIS — Z9221 Personal history of antineoplastic chemotherapy: Secondary | ICD-10-CM

## 2016-07-29 DIAGNOSIS — Z8572 Personal history of non-Hodgkin lymphomas: Secondary | ICD-10-CM

## 2016-07-29 LAB — CBC WITH DIFFERENTIAL/PLATELET
Basophils Absolute: 0 10*3/uL (ref 0.0–0.1)
Basophils Relative: 0 %
Eosinophils Absolute: 0 10*3/uL (ref 0.0–0.7)
Eosinophils Relative: 0 %
HCT: 30.7 % — ABNORMAL LOW (ref 36.0–46.0)
Hemoglobin: 9.5 g/dL — ABNORMAL LOW (ref 12.0–15.0)
Lymphocytes Relative: 5 %
Lymphs Abs: 0.4 10*3/uL — ABNORMAL LOW (ref 0.7–4.0)
MCH: 30.3 pg (ref 26.0–34.0)
MCHC: 30.9 g/dL (ref 30.0–36.0)
MCV: 97.8 fL (ref 78.0–100.0)
Monocytes Absolute: 0.8 10*3/uL (ref 0.1–1.0)
Monocytes Relative: 10 %
Neutro Abs: 6.7 10*3/uL (ref 1.7–7.7)
Neutrophils Relative %: 85 %
Platelets: 290 10*3/uL (ref 150–400)
RBC: 3.14 MIL/uL — ABNORMAL LOW (ref 3.87–5.11)
RDW: 19.9 % — ABNORMAL HIGH (ref 11.5–15.5)
WBC: 8 10*3/uL (ref 4.0–10.5)

## 2016-07-29 LAB — COMPREHENSIVE METABOLIC PANEL
ALT: 19 U/L (ref 14–54)
AST: 26 U/L (ref 15–41)
Albumin: 3.4 g/dL — ABNORMAL LOW (ref 3.5–5.0)
Alkaline Phosphatase: 83 U/L (ref 38–126)
Anion gap: 13 (ref 5–15)
BUN: 104 mg/dL — ABNORMAL HIGH (ref 6–20)
CO2: 34 mmol/L — ABNORMAL HIGH (ref 22–32)
Calcium: 9 mg/dL (ref 8.9–10.3)
Chloride: 92 mmol/L — ABNORMAL LOW (ref 101–111)
Creatinine, Ser: 1.85 mg/dL — ABNORMAL HIGH (ref 0.44–1.00)
GFR calc Af Amer: 32 mL/min — ABNORMAL LOW (ref >60)
GFR calc non Af Amer: 27 mL/min — ABNORMAL LOW (ref >60)
Glucose, Bld: 108 mg/dL — ABNORMAL HIGH (ref 65–99)
Potassium: 3.3 mmol/L — ABNORMAL LOW (ref 3.5–5.1)
Sodium: 139 mmol/L (ref 135–145)
Total Bilirubin: 0.6 mg/dL (ref 0.3–1.2)
Total Protein: 6.5 g/dL (ref 6.5–8.1)

## 2016-07-29 LAB — CBC
HCT: 29 % — ABNORMAL LOW (ref 36.0–46.0)
HCT: 24.7 % — ABNORMAL LOW (ref 36.0–46.0)
Hemoglobin: 8.9 g/dL — ABNORMAL LOW (ref 12.0–15.0)
Hemoglobin: 7.8 g/dL — ABNORMAL LOW (ref 12.0–15.0)
MCH: 30.3 pg (ref 26.0–34.0)
MCH: 30.8 pg (ref 26.0–34.0)
MCHC: 30.7 g/dL (ref 30.0–36.0)
MCHC: 31.6 g/dL (ref 30.0–36.0)
MCV: 98.6 fL (ref 78.0–100.0)
MCV: 97.6 fL (ref 78.0–100.0)
Platelets: 292 10*3/uL (ref 150–400)
Platelets: 279 10*3/uL (ref 150–400)
RBC: 2.94 MIL/uL — ABNORMAL LOW (ref 3.87–5.11)
RBC: 2.53 MIL/uL — ABNORMAL LOW (ref 3.87–5.11)
RDW: 19.9 % — ABNORMAL HIGH (ref 11.5–15.5)
RDW: 20.1 % — ABNORMAL HIGH (ref 11.5–15.5)
WBC: 6.5 10*3/uL (ref 4.0–10.5)
WBC: 5.9 10*3/uL (ref 4.0–10.5)

## 2016-07-29 LAB — I-STAT TROPONIN, ED: Troponin i, poc: 0.01 ng/mL (ref 0.00–0.08)

## 2016-07-29 LAB — MRSA PCR SCREENING: MRSA by PCR: POSITIVE — AB

## 2016-07-29 LAB — POC OCCULT BLOOD, ED: Fecal Occult Bld: POSITIVE — AB

## 2016-07-29 LAB — LIPASE, BLOOD: Lipase: 72 U/L — ABNORMAL HIGH (ref 11–51)

## 2016-07-29 LAB — PREPARE RBC (CROSSMATCH)

## 2016-07-29 LAB — MAGNESIUM: Magnesium: 2.2 mg/dL (ref 1.7–2.4)

## 2016-07-29 MED ORDER — ONDANSETRON HCL 4 MG PO TABS: 4.0000 mg | ORAL_TABLET | Freq: Four times a day (QID) | ORAL | Status: DC | PRN

## 2016-07-29 MED ORDER — POTASSIUM CHLORIDE 10 MEQ/100ML IV SOLN
INTRAVENOUS | Status: AC
  Administered 2016-07-29 (×3)
  Filled 2016-07-29: qty 100

## 2016-07-29 MED ORDER — ONDANSETRON HCL 4 MG/2ML IJ SOLN
4.0000 mg | Freq: Three times a day (TID) | INTRAMUSCULAR | Status: DC | PRN
  Administered 2016-07-29 – 2016-07-30 (×2): 4 mg via INTRAVENOUS
  Filled 2016-07-29: qty 2

## 2016-07-29 MED ORDER — MUPIROCIN 2 % EX OINT
TOPICAL_OINTMENT | Freq: Two times a day (BID) | CUTANEOUS | Status: DC
  Administered 2016-07-29 – 2016-07-30 (×3): via NASAL
  Administered 2016-07-31: 1 via NASAL
  Administered 2016-07-31: 22:00:00 via NASAL
  Administered 2016-08-01: 1 via NASAL
  Filled 2016-07-29: qty 22

## 2016-07-29 MED ORDER — ATORVASTATIN CALCIUM 10 MG PO TABS
10.0000 mg | ORAL_TABLET | Freq: Every day | ORAL | Status: DC
  Administered 2016-07-30 – 2016-08-01 (×3): 10 mg via ORAL
  Filled 2016-07-29 (×4): qty 1

## 2016-07-29 MED ORDER — SODIUM CHLORIDE 0.9 % IV SOLN
INTRAVENOUS | Status: DC
  Administered 2016-07-29 – 2016-07-30 (×3): via INTRAVENOUS
  Administered 2016-07-31: 1000 mL via INTRAVENOUS

## 2016-07-29 MED ORDER — ONDANSETRON HCL 4 MG/2ML IJ SOLN
INTRAMUSCULAR | Status: AC
  Filled 2016-07-29: qty 2

## 2016-07-29 MED ORDER — POLYETHYLENE GLYCOL 3350 17 G PO PACK: 17.0000 g | PACK | Freq: Every day | ORAL | Status: DC | PRN

## 2016-07-29 MED ORDER — POTASSIUM CHLORIDE 10 MEQ/100ML IV SOLN: 10.0000 meq | INTRAVENOUS | Status: AC

## 2016-07-29 MED ORDER — GABAPENTIN 300 MG PO CAPS
600.0000 mg | ORAL_CAPSULE | Freq: Two times a day (BID) | ORAL | Status: DC
  Administered 2016-07-30 – 2016-08-01 (×5): 600 mg via ORAL
  Filled 2016-07-29 (×7): qty 2

## 2016-07-29 MED ORDER — ONDANSETRON HCL 4 MG/2ML IJ SOLN: 4.0000 mg | Freq: Four times a day (QID) | INTRAMUSCULAR | Status: DC | PRN

## 2016-07-29 MED ORDER — SODIUM CHLORIDE 0.9% FLUSH
3.0000 mL | Freq: Two times a day (BID) | INTRAVENOUS | Status: DC
  Administered 2016-07-29 – 2016-07-30 (×4): 3 mL via INTRAVENOUS

## 2016-07-29 MED ORDER — CHLORHEXIDINE GLUCONATE CLOTH 2 % EX PADS
6.0000 | MEDICATED_PAD | Freq: Every day | CUTANEOUS | Status: DC
  Administered 2016-07-30 – 2016-08-01 (×3): 6 via TOPICAL

## 2016-07-29 MED ORDER — BISACODYL 5 MG PO TBEC: 5.0000 mg | DELAYED_RELEASE_TABLET | Freq: Every day | ORAL | Status: DC | PRN

## 2016-07-29 MED ORDER — PREDNISONE 5 MG PO TABS
5.0000 mg | ORAL_TABLET | Freq: Every day | ORAL | Status: DC
  Administered 2016-07-30 – 2016-08-01 (×3): 5 mg via ORAL
  Filled 2016-07-29 (×3): qty 1

## 2016-07-29 MED ORDER — SODIUM CHLORIDE 0.9 % IV SOLN
80.0000 mg | Freq: Once | INTRAVENOUS | Status: AC
  Administered 2016-07-29: 80 mg via INTRAVENOUS
  Filled 2016-07-29: qty 80

## 2016-07-29 MED ORDER — ACETAMINOPHEN 650 MG RE SUPP: 650.0000 mg | Freq: Four times a day (QID) | RECTAL | Status: DC | PRN

## 2016-07-29 MED ORDER — SODIUM CHLORIDE 0.9 % IV SOLN
8.0000 mg/h | INTRAVENOUS | Status: AC
  Administered 2016-07-29 – 2016-07-31 (×4): 8 mg/h via INTRAVENOUS
  Filled 2016-07-29 (×13): qty 80

## 2016-07-29 MED ORDER — ACETAMINOPHEN 325 MG PO TABS
650.0000 mg | ORAL_TABLET | Freq: Four times a day (QID) | ORAL | Status: DC | PRN
  Administered 2016-07-30 – 2016-07-31 (×2): 650 mg via ORAL
  Filled 2016-07-29 (×2): qty 2

## 2016-07-29 MED ORDER — SODIUM CHLORIDE 0.9 % IV BOLUS (SEPSIS)
1000.0000 mL | Freq: Once | INTRAVENOUS | Status: AC
  Administered 2016-07-29: 1000 mL via INTRAVENOUS

## 2016-07-29 MED ORDER — SODIUM CHLORIDE 0.9 % IV SOLN: Freq: Once | INTRAVENOUS | Status: DC

## 2016-07-29 MED ORDER — TRAZODONE HCL 50 MG PO TABS: 25.0000 mg | ORAL_TABLET | Freq: Every evening | ORAL | Status: DC | PRN

## 2016-07-29 MED ORDER — CITALOPRAM HYDROBROMIDE 10 MG PO TABS: 30.0000 mg | ORAL_TABLET | Freq: Every day | ORAL | Status: DC

## 2016-07-29 NOTE — Progress Notes (Signed)
Nurse called for antiemetic. Patient with prolonged QTC. No great choices for antiemetics. Will give Zofran-sparingly.   Also, hgb fallen 0.5gms since ED this am. Down to 8.5 now. Overall down 3 grams since last cbc three days ago. GI saw - on watch and wait for now. Place order for one unit of blood to be given IF hgb falls below 8. RN will pass along in report that this is PRN order only.

## 2016-07-29 NOTE — ED Notes (Signed)
Gave pt Sprite Zero, per [facility] - Therapist, sports.

## 2016-07-29 NOTE — ED Notes (Signed)
Husband at bedside reports patient in fact had two syncopal episodes this morning, not one.

## 2016-07-29 NOTE — ED Triage Notes (Addendum)
Pt to ER BIB [facility] from home with complaint of multiple episodes of dark emesis since 2 am this morning with one episode of syncope while sitting in her living room. Pt reports diarrhea yesterday as well. Denies pain. Hx of anemia requiring blood transfusions and iron transfusions. A/o x4. Pt received 400 cc NS in route. VS - BP 96/46, HR 70, 96% on daily O2 2L, RR 12.

## 2016-07-29 NOTE — Consult Note (Signed)
[facility] Consult Note  Referring Provider: No ref. provider found Primary Care Physician:  Hoyt Koch, MD Primary Gastroenterologist:  Dr.  Laurel Dimmer Complaint: Syncope HPI: [name] [name] is an 65 y.o. white female  who awoke in the night with the urge to defecate with some nausea and abdominal discomfort, did not have a stool and returned to bed. Later her husband heard a thud and returned to the bathroom to 5 her legs there more lethargic and apparently having roused from a faint. Then she complained of malaise and fainted again and was unresponsive for 2 minutes then roused spontaneously and was probably emergency room. She did not have a bowel movement either time but states that she had dark brown to coffee-ground appearing emesis both time she went to the bathroom.  neither were witnessed by her husband. She was brought in here found to have a hemoglobin 9.5 and elevated BUN of 100 and his elevated creatinine above baseline. She has had chronically elevated BUNs a varying degree looking back over the last 6 months. She did have a heme positive stool here. She currently is alert and not nausea or having abdominal pain. She has had workups for anemia and heme positive stool as well as presentation similar to that described above and had an EGD and colonoscopy in October 2016 which showed candidal esophagitis and a rectal polyp respectively. Repeat EGD in May showed mild reflux type esophagitis. The patient remains on Plavix. Her last hemoglobin in May was 11.0.  Past Medical History:  Diagnosis Date  . Anemia   . DOE (dyspnea on exertion)   . Epistaxis   . History of chicken pox   . Hypertension   . Melanoma ([place]) 1999  . 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[place])   . Non Hodgkin's lymphoma ([place])    Treated with chemo 2010, felt to be cured.  Marland Kitchen PAH (pulmonary artery hypertension) ([place])   . Peripheral arterial occlusive disease ([place])   . Pulmonary hypertension ([place])   .  Sclerodermia generalized [facility]     Past Surgical History:  Procedure Laterality Date  . apligraph  12-04-2102,   12-18-2012   bilateral calves   [name]  . biopsy on cerebellum    . COLONOSCOPY WITH PROPOFOL Left 10/07/2015   Procedure: COLONOSCOPY WITH PROPOFOL;  Surgeon: [name], MD;  Location: [place];  Service: Endoscopy;  Laterality: Left;  . ENDOVENOUS ABLATION SAPHENOUS VEIN W/ LASER  07-24-2012   right greater saphenous vein by [name], M.D.   . ENDOVENOUS ABLATION SAPHENOUS VEIN W/ LASER  10-16-2012   left greater saphenous vein  by Dr. [name]  . ENDOVENOUS ABLATION SAPHENOUS VEIN W/ LASER Right 09-01-2015   endovenous laser ablation right greater saphenous vein by [name] MD  . ESOPHAGOGASTRODUODENOSCOPY N/A 05/07/2016   Procedure: ESOPHAGOGASTRODUODENOSCOPY (EGD);  Surgeon: [name], MD;  Location: [facility] ENDOSCOPY;  Service: Endoscopy;  Laterality: N/A;  . ESOPHAGOGASTRODUODENOSCOPY (EGD) WITH PROPOFOL Left 10/07/2015   Procedure: ESOPHAGOGASTRODUODENOSCOPY (EGD) WITH PROPOFOL;  Surgeon: [name], MD;  Location: [place];  Service: Endoscopy;  Laterality: Left;  . RIGHT HEART CATHETERIZATION N/A 11/14/2012   Procedure: RIGHT HEART CATH;  Surgeon: [name], MD;  Location: [facility] CATH LAB;  Service: Cardiovascular;  Laterality: N/A;  . rt little toe removal  10/1998  . SMALL INTESTINE SURGERY    . stent left thigh  3/11  . TONSILLECTOMY AND ADENOIDECTOMY    . TUNNELED VENOUS CATHETER PLACEMENT  01/2013  .  VEIN LIGATION AND STRIPPING  05/16/2012   Procedure: VEIN LIGATION AND STRIPPING;  Surgeon: [name], MD;  Location: [facility];  Service: Vascular;  Laterality: Right;  Irrigation and Debridement right lower leg, ligation of saphenous vein.     (Not in a hospital admission)  Allergies:  Allergies  Allergen Reactions  . Sulfa Antibiotics     Severe rash with significant peeling of face. No airway involvement per patient   . Levaquin [Levofloxacin In D5w] Other (See Comments)    FLU LIKE SYMPTOM  . Penicillins Rash    Family History  Problem Relation Age of Onset  . Lupus Father   . Lymphoma Father   . Hyperlipidemia Mother   . Cancer Daughter     sarcoma  . Colon cancer      Social History:  reports that she has never smoked. She has never used smokeless tobacco. She reports that she drinks alcohol. She reports that she does not use drugs.  Review of Systems: negative except As above   Blood pressure 103/56, pulse 75, temperature 98.1 F (36.7 C), temperature source Oral, resp. rate 15, height _0  (1.702 m), weight 61.2 kg (135 lb), SpO2 100 %. Head: Normocephalic, without obvious abnormality, atraumatic Neck: no adenopathy, no carotid bruit, no JVD, supple, symmetrical, trachea midline and thyroid not enlarged, symmetric, no tenderness/mass/nodules Resp: clear to auscultation bilaterally Cardio: regular rate and rhythm, S1, S2 normal, no murmur, click, rub or gallop GI: Abdomen soft nondistended with normoactive bowel sounds. No hepatomegaly masses or guarding Extremities: extremities normal, atraumatic, no cyanosis or edema  Results for orders placed or performed during the hospital encounter of 07/29/16 (from the past 48 hour(s))  Comprehensive metabolic panel     Status: Abnormal   Collection Time: 07/29/16 10:00 AM  Result Value Ref Range   Sodium 139 135 - 145 mmol/L   Potassium 3.3 (L) 3.5 - 5.1 mmol/L   Chloride 92 (L) 101 - 111 mmol/L   CO2 34 (H) 22 - 32 mmol/L   Glucose, Bld 108 (H) 65 - 99 mg/dL   BUN 104 (H) 6 - 20 mg/dL   Creatinine, Ser 1.85 (H) 0.44 - 1.00 mg/dL   Calcium 9.0 8.9 - 10.3 mg/dL   Total Protein 6.5 6.5 - 8.1 g/dL   Albumin 3.4 (L) 3.5 - 5.0 g/dL   AST 26 15 - 41 U/L   ALT 19 14 - 54 U/L   Alkaline Phosphatase 83 38 - 126 U/L   Total Bilirubin 0.6 0.3 - 1.2 mg/dL   GFR calc non Af Amer 27 (L) >60 mL/min   GFR calc Af Amer 32 (L) >60 mL/min    Comment:  (NOTE) The eGFR has been calculated using the CKD EPI equation. This calculation has not been validated in all clinical situations. eGFR's persistently <60 mL/min signify possible Chronic Kidney Disease.    Anion gap 13 5 - 15  Lipase, blood     Status: Abnormal   Collection Time: 07/29/16 10:00 AM  Result Value Ref Range   Lipase 72 (H) 11 - 51 U/L  CBC with Differential     Status: Abnormal   Collection Time: 07/29/16 10:00 AM  Result Value Ref Range   WBC 8.0 4.0 - 10.5 K/uL   RBC 3.14 (L) 3.87 - 5.11 MIL/uL   Hemoglobin 9.5 (L) 12.0 - 15.0 g/dL   HCT 30.7 (L) 36.0 - 46.0 %   MCV 97.8 78.0 - 100.0 fL   MCH  30.3 26.0 - 34.0 pg   MCHC 30.9 30.0 - 36.0 g/dL   RDW 19.9 (H) 11.5 - 15.5 %   Platelets 290 150 - 400 K/uL   Neutrophils Relative % 85 %   Neutro Abs 6.7 1.7 - 7.7 K/uL   Lymphocytes Relative 5 %   Lymphs Abs 0.4 (L) 0.7 - 4.0 K/uL   Monocytes Relative 10 %   Monocytes Absolute 0.8 0.1 - 1.0 K/uL   Eosinophils Relative 0 %   Eosinophils Absolute 0.0 0.0 - 0.7 K/uL   Basophils Relative 0 %   Basophils Absolute 0.0 0.0 - 0.1 K/uL  I-stat troponin, ED     Status: None   Collection Time: 07/29/16 10:03 AM  Result Value Ref Range   Troponin i, poc 0.01 0.00 - 0.08 ng/mL   Comment 3            Comment: Due to the release kinetics of cTnI, a negative result within the first hours of the onset of symptoms does not rule out myocardial infarction with certainty. If myocardial infarction is still suspected, repeat the test at appropriate intervals.   POC occult blood, ED     Status: Abnormal   Collection Time: 07/29/16 10:35 AM  Result Value Ref Range   Fecal Occult Bld POSITIVE (A) NEGATIVE   No results found.  Assessment: Syncopal episode with heme positive stools and description of coffee-ground emesis. Some drop in hemoglobin. Overall primary presentation does not appear to be an acute GI bleed. Plan:  Monitor stools hemoglobin and further symptoms. Doubt  further endoscopic workup will be of significant yield. Treat empirically with PPI Okay for clear liquid diet for now. Tytan Sandate C 07/29/2016, 12:02 PM  Pager [phone] If no answer or after 5 PM call [phone]

## 2016-07-29 NOTE — H&P (Signed)
History and Physical    [name] [name] KPT:[id number] DOB: 1951-05-14 DOA: 07/29/2016  PCP: Hoyt Koch, MD  Patient coming from:  home  Chief Complaint:  passed out / vomited blood  HPI: [name] [name] is a 65 y.o. female with a medical history significant for, but not  limited to,  scleroderma, pulmonary hypertension, peripheral artery disease, and  non-Hodgkin's lymphoma, melanoma  Patient woke up in the middle of the night nauseated. She went to the bathroom and at some point had a syncopal episode. Husband is present and helps provide history. Per husband patient actually had to syncopal episodes around which time she vomited some food and dark brown material. Patient went to bed feeling okay. No recent abdominal pain or nausea.. Her stools are normally dark on iron. Patient had an iron transfusion 3 weeks ago. She gets blood work on a regular basis and 3 days ago hemoglobin was in mid 11 range. Yesterday stools were loose but this is not necessarily abnormal for her. Stool consistency depends on what ever she eats.    ED Course:  Afebrile, hemodynamically stable. Normal white count, hemoglobin 9.5, BUN 75 , creatinine 1.68.  Patient received 400 mL IV fluid and Route Currently on protonix drip Given 1 L fluid bolus  Review of Systems: As per HPI, otherwise 10 point review of systems negative.   Past Medical History:  Diagnosis Date  . Anemia   . DOE (dyspnea on exertion)   . Epistaxis   . History of chicken pox   . Hypertension   . Melanoma ([place]) 1999  . 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[place])   . Non Hodgkin's lymphoma ([place])    Treated with chemo 2010, felt to be cured.  Marland Kitchen PAH (pulmonary artery hypertension) ([place])   . Peripheral arterial occlusive disease ([place])   . Pulmonary hypertension ([place])   . Sclerodermia generalized [facility])     Past Surgical History:  Procedure Laterality Date  . apligraph  12-04-2102,   12-18-2012   bilateral calves   [name]   . biopsy on cerebellum    . COLONOSCOPY WITH PROPOFOL Left 10/07/2015   Procedure: COLONOSCOPY WITH PROPOFOL;  Surgeon: [name], MD;  Location: [place];  Service: Endoscopy;  Laterality: Left;  . ENDOVENOUS ABLATION SAPHENOUS VEIN W/ LASER  07-24-2012   right greater saphenous vein by [name], M.D.   . ENDOVENOUS ABLATION SAPHENOUS VEIN W/ LASER  10-16-2012   left greater saphenous vein  by Dr. [name]  . ENDOVENOUS ABLATION SAPHENOUS VEIN W/ LASER Right 09-01-2015   endovenous laser ablation right greater saphenous vein by [name] MD  . ESOPHAGOGASTRODUODENOSCOPY N/A 05/07/2016   Procedure: ESOPHAGOGASTRODUODENOSCOPY (EGD);  Surgeon: [name], MD;  Location: [facility] ENDOSCOPY;  Service: Endoscopy;  Laterality: N/A;  . ESOPHAGOGASTRODUODENOSCOPY (EGD) WITH PROPOFOL Left 10/07/2015   Procedure: ESOPHAGOGASTRODUODENOSCOPY (EGD) WITH PROPOFOL;  Surgeon: [name], MD;  Location: [place];  Service: Endoscopy;  Laterality: Left;  . RIGHT HEART CATHETERIZATION N/A 11/14/2012   Procedure: RIGHT HEART CATH;  Surgeon: [name], MD;  Location: [facility] CATH LAB;  Service: Cardiovascular;  Laterality: N/A;  . rt little toe removal  10/1998  . SMALL INTESTINE SURGERY    . stent left thigh  3/11  . TONSILLECTOMY AND ADENOIDECTOMY    . TUNNELED VENOUS CATHETER PLACEMENT  01/2013  . VEIN LIGATION AND STRIPPING  05/16/2012   Procedure: VEIN LIGATION AND STRIPPING;  Surgeon: [name], MD;  Location: [facility]  OR;  Service: Vascular;  Laterality: Right;  Irrigation and Debridement right lower leg, ligation of saphenous vein.    Social History   Social History  . Marital status: Married    Spouse name: N/A  . Number of children: 1  . Years of education: N/A   Occupational History  . homemaker    Social History Main Topics  . Smoking status: Never Smoker  . Smokeless tobacco: Never Used  . Alcohol use 0.0 oz/week     Comment: Occasional  . Drug use: No  . Sexual activity:  Not Currently   Other Topics Concern  . Not on file   Social History Narrative  . No narrative on file  lives at home with husband. No assistive devices needed for ambulation   Allergies  Allergen Reactions  . Sulfa Antibiotics     Severe rash with significant peeling of face. No airway involvement per patient  . Levaquin [Levofloxacin In D5w] Other (See Comments)    FLU LIKE SYMPTOM  . Penicillins Rash    Family History  Problem Relation Age of Onset  . Lupus Father   . Lymphoma Father   . Hyperlipidemia Mother   . Cancer Daughter     sarcoma  . Colon cancer      Prior to Admission medications   Medication Sig Start Date End Date Taking? Authorizing Provider  acidophilus (RISAQUAD) CAPS Take 1 capsule by mouth every morning.    Yes Historical Provider, MD  aspirin 81 MG tablet Take 1 tablet (81 mg total) by mouth every evening. 05/08/16  Yes Robbie Lis, MD  atorvastatin (LIPITOR) 10 MG tablet Take 1 tablet (10 mg total) by mouth daily. 03/09/16  Yes Hoyt Koch, MD  BIOTIN PO Take 1 tablet by mouth daily.   Yes Historical Provider, MD  cholecalciferol (VITAMIN D) 1000 UNITS tablet Take 2,000 Units by mouth every evening.    Yes Historical Provider, MD  citalopram (CELEXA) 20 MG tablet Take 30 mg by mouth daily.   Yes Historical Provider, MD  clopidogrel (PLAVIX) 75 MG tablet take 1 tablet by mouth once daily 11/23/15  Yes Hoyt Koch, MD  fish oil-omega-3 fatty acids 1000 MG capsule Take 1 g by mouth 2 (two) times daily.    Yes Historical Provider, MD  gabapentin (NEURONTIN) 300 MG capsule take 1 capsule by mouth every morning and 2 every evening Patient taking differently: Take 600 mg by mouth 2 (two) times daily. take 1 capsule by mouth every morning and 2 every evening 03/09/16  Yes Hoyt Koch, MD  gabapentin (NEURONTIN) 300 MG capsule Take 600 mg by mouth 2 (two) times daily.   Yes Historical Provider, MD  Macitentan (OPSUMIT) 10 MG TABS Take  10 mg by mouth daily. 03/09/16  Yes Hoyt Koch, MD  moxifloxacin (VIGAMOX) 0.5 % ophthalmic solution Place 1 drop into both eyes 3 (three) times daily as needed (allergies).    Yes Historical Provider, MD  multivitamin [facility]) per tablet Take 1 tablet by mouth daily.    Yes Historical Provider, MD  pantoprazole (PROTONIX) 40 MG tablet Take 1 tablet (40 mg total) by mouth 2 (two) times daily. 05/08/16  Yes Robbie Lis, MD  POLY-IRON 150 150 MG capsule take 1 capsule by mouth twice a day 04/30/16  Yes Hoyt Koch, MD  potassium chloride (K-DUR) 10 MEQ tablet Take 1 tablet (10 mEq total) by mouth daily. 03/09/16  Yes Hoyt Koch, MD  predniSONE (DELTASONE) 2.5 MG tablet Take 5 mg by mouth daily.  04/23/16  Yes Historical Provider, MD  predniSONE (DELTASONE) 5 MG tablet Take 5 mg by mouth daily with breakfast.   Yes Historical Provider, MD  sildenafil (REVATIO) 20 MG tablet Take 2 tablets (40 mg total) by mouth 3 (three) times daily. 05/30/16  Yes [name], MD  spironolactone (ALDACTONE) 25 MG tablet Take 1 tablet (25 mg total) by mouth daily. 03/09/16  Yes Hoyt Koch, MD  torsemide (DEMADEX) 20 MG tablet Take 2 tablets (40 mg total) by mouth 2 (two) times daily. 03/09/16  Yes Hoyt Koch, MD  vitamin B-12 (CYANOCOBALAMIN) 500 MCG tablet Take 500 mcg by mouth every morning.    Yes Historical Provider, MD  clopidogrel (PLAVIX) 75 MG tablet take 1 tablet by mouth once daily RESUME IN 1 WEEK 07/27/16   Hoyt Koch, MD  OXYGEN Inhale 2 L into the lungs continuous.    Historical Provider, MD    Physical Exam: Vitals:   07/29/16 0922 07/29/16 0930 07/29/16 0945 07/29/16 1000  BP:  (!) 105/54 106/56 103/56  Pulse:  70 78 75  Resp:  _0 Temp:      TempSrc:      SpO2:  99% 100% 100%  Weight: 61.2 kg (135 lb)     Height: _1  (1.702 m)       Constitutional:  NAD, calm, comfortable Vitals:   07/29/16 0922 07/29/16 0930 07/29/16 0945  07/29/16 1000  BP:  (!) 105/54 106/56 103/56  Pulse:  70 78 75  Resp:  _2 Temp:      TempSrc:      SpO2:  99% 100% 100%  Weight: 61.2 kg (135 lb)     Height: _3  (1.702 m)      Eyes: PER, lids and conjunctivae normal ENMT: Mucous membranes are moist. Posterior pharynx clear of any exudate or lesions..  Neck: normal, supple, no masses Respiratory: clear to auscultation bilaterally, no wheezing, no crackles. Normal respiratory effort. No accessory muscle use.  Cardiovascular: Regular rate and rhythm, no murmurs / rubs / gallops. No extremity edema. 2+ pedal pulses.   Abdomen: no tenderness, no masses palpated. No hepatomegaly. Bowel sounds positive.  Rectal:  Soft maroon stool in vault Musculoskeletal: no clubbing / cyanosis. No joint deformity upper and lower extremities. Good ROM, no contractures. Normal muscle tone.  Skin: no rashes, lesions, ulcers.  Neurologic: CN 2-12 grossly intact. Sensation intact, Strength 5/5 in all 4.  Psychiatric: Normal judgment and insight. Alert and oriented x 3. Normal mood.    Labs on Admission: I have personally reviewed following labs and imaging studies   Urine analysis:    Component Value Date/Time   COLORURINE YELLOW 05/06/2016 2057   APPEARANCEUR CLOUDY (A) 05/06/2016 2057   LABSPEC 1.014 05/06/2016 2057   PHURINE 7.0 05/06/2016 2057   GLUCOSEU NEGATIVE 05/06/2016 2057   HGBUR NEGATIVE 05/06/2016 2057   BILIRUBINUR NEGATIVE 05/06/2016 2057   BILIRUBINUR n 08/12/2012 1420   KETONESUR NEGATIVE 05/06/2016 2057   PROTEINUR NEGATIVE 05/06/2016 2057   UROBILINOGEN 0.2 08/12/2012 1420   NITRITE NEGATIVE 05/06/2016 2057   LEUKOCYTESUR SMALL (A) 05/06/2016 2057    Radiological Exams on Admission: No results found.  EKG: Independently reviewed.   EKG Interpretation  Date/Time:  Sunday July 29 2016 09:19:01 EDT Ventricular Rate:  70 PR Interval:    QRS Duration: 102 QT Interval:  516 QTC Calculation: 557  R Axis:   107 Text  Interpretation:  Sinus rhythm Probable left atrial enlargement Probable RVH w/ secondary repol abnormality Nonspecific T abnormalities, lateral leads Prolonged QT interval Continues to have deep inverted T waves in V2 and V3 when compared to previous tracing from May 16, 2016 Confirmed by Watts, Raquel Sarna ([id number]) on 07/29/2016 10:03:31 AM      Assessment/Plan   Principal Problem:   Gastrointestinal bleed Active Problems:   Hyperlipidemia   Pulmonary hypertension associated with systemic disorder (HCC)   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HCC)   Depression   Scleroderma (HCC)   Prolonged Q-T interval on ECG      Recurrent GI bleed on aspirin and Plavix. Hgb 11.6 3 days ago, down to 9.5 now. -OBS - tele -Gastroenterology consult- spoke with Dr. Amedeo Plenty at [facility] who will see patient -NPO until seen by GI, gentle hydration with 53m/ hr -Hold aspirin and Plavix -Continue protonix drip -cbc now and Q6 -type and screen  Hx of severe esophagitis May 2017. On BID PPI at home.  -PPI gtt for now.   Hypokalemia, 3.2 -Will give 3 runs of IV K+ -am bmet -check Mg+ level  CKD, stage 3-4. Difficult to ascertain baseline but renal function appears slightly worse than baseline. BUN definitely above baseline in setting of GI bleed (104) , creatinine 1.85. -gentle IV hydration -management of GI bleed -avoid nephrotoxic med -am bmet  Prolonged QTc on ECG, 557. QTC higher in May (590).  -replete K+ -check Mg+ -Avoid QT prolonging medications- hold celexa -Monitor on telemetry -am ECG  Scleroderma with Pulmonary hypertension.          -Hold diuretics, sildenafil and Macitentan until resolution of GI bleed    Hyperlipidemia -Continue home statin.  Depression, stable -holding celexa for prolonged QTc.   RLE wound, currently dressed. [facility]                                      DVT prophylaxis:    SCDs Code Status:   Full code  Family Communication:     Treatment plan  discussed with suband in the room and he understands and agrees with the plan..  Disposition Plan: Discharge home  in 24-48 hours              Consults called:  [facility] - Dr. HAmedeo Plenty Admission status:  Observation -  Telemetry  PTye SavoyNP [facility] Pager [phone] If 7PM-7AM, please contact night-coverage [email or web address] Password [email or web address] 07/29/2016, 11:37 AM

## 2016-07-29 NOTE — Progress Notes (Signed)
MARVEEN DONLON [id number] Admission Data: 07/29/2016 4:38 PM Attending Provider: Maren Reamer, MD  HEN:[id number] A Crawford, MD Consults/ Treatment Team: Treatment Team:  Teena Irani, MD  ODIS WICKEY is a 65 y.o. female patient admitted from ED awake, alert  & orientated  X 3,  Full Code, VSS - Blood pressure (!) 105/41, pulse 100, temperature 98.7 F (37.1 C), temperature source Oral, resp. rate 17, height 5' 7" (1.702 m), weight 62.4 kg (137 lb 8 oz), SpO2 100 %., O2    2 L nasal cannular, no c/o shortness of breath, no c/o chest pain, no distress noted. Tele # 14 placed and pt is currently running:normal sinus rhythm.   IV site WDL:  wrist right, condition patent and no redness with a transparent dsg that's clean dry and intact.  Allergies:   Allergies  Allergen Reactions  . Sulfa Antibiotics     Severe rash with significant peeling of face. No airway involvement per patient  . Levaquin [Levofloxacin In D5w] Other (See Comments)    FLU LIKE SYMPTOM  . Penicillins Rash     Past Medical History:  Diagnosis Date  . Anemia   . DOE (dyspnea on exertion)   . Epistaxis   . History of chicken pox   . Hypertension   . Melanoma ([place]) 1999  . 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[place])   . Non Hodgkin's lymphoma ([place])    Treated with chemo 2010, felt to be cured.  Marland Kitchen PAH (pulmonary artery hypertension) ([place])   . Peripheral arterial occlusive disease ([place])   . Pulmonary hypertension ([place])   . Sclerodermia generalized ([place])     History:  obtained from chart review. Tobacco/alcohol: denied none  Pt orientation to unit, room and routine. Information packet given to patient/family and safety video watched.  Admission INP armband ID verified with patient/family, and in place. SR up x 2, fall risk assessment complete with Patient and family verbalizing understanding of risks associated with falls. Pt verbalizes an understanding of how to use the call bell and to call for help before getting out of  bed.  Skin, clean-dry- intact without evidence of bruising, or skin tears.   No evidence of skin break down noted on exam. no rashes, no ecchymoses, no petechiae, no nodules, no jaundice, no purpura Wound to right lower extremity, wrapped in ace bandage and coban. Unable to assess. Patient refused dressing change.     Will cont to monitor and assist as needed.  Salley Slaughter, RN 07/29/2016 4:38 PM

## 2016-07-29 NOTE — ED Provider Notes (Signed)
[facility] DEPT Provider Note   CSN: [id number] Arrival date & time: 07/29/16  0901  First Provider Contact:  None       History   Chief Complaint Chief Complaint  Patient presents with  . Loss of Consciousness    HPI [name] [name] is a 65 y.o. female.  HPI Patient with a history of GI bleed of unknown origin, anemia, scleroderma, presents with loss of consciousness. She reports awakening approximately 8 hours ago with nausea and episodes of emesis consistent of ground up food and dark fluid. At that point she passed out. Husband was witness, she was unconscious for approximately 2 minutes. She then went back to bed and had a similar episode occur 1 time.  She reports she's had episodes like this before passing out with vomiting. She reports dark stools for a while that she attributes to iron infusions. She states she's had recent EGD and colonoscopies that were negative. She states she is on Protonix at home.  She denies any chest pain or shortness of breath of these events. She denies any fevers at home and states that she was a normal day for her.  Past Medical History:  Diagnosis Date  . Anemia   . DOE (dyspnea on exertion)   . Epistaxis   . History of chicken pox   . Hypertension   . Melanoma ([place]) 1999  . 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[place])   . Non Hodgkin's lymphoma ([place])    Treated with chemo 2010, felt to be cured.  Marland Kitchen PAH (pulmonary artery hypertension) ([place])   . Peripheral arterial occlusive disease ([place])   . Pulmonary hypertension ([place])   . 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 Routine general medical examination at a health care facility 06/29/2016  . UGI bleed 05/06/2016  . Scleroderma ([place]) 05/06/2016  . Absolute anemia 05/02/2016  . Depression 04/24/2016  . Fungal esophagitis; Probable per EGD 10/07/2015 10/08/2015  . Hypokalemia 10/07/2015  . Anemia, iron deficiency   . 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 Allergic rhinitis 03/08/2015  . 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[place]) 04/08/2012  . Pulmonary hypertension associated with systemic disorder ([place]) 03/25/2012  . Peripheral arterial occlusive disease ([place]) 08/10/2011  . Scleroderma, circumscribed or localized 08/10/2011  . Essential hypertension 08/10/2011  . Hyperlipidemia 08/10/2011    Past Surgical History:  Procedure Laterality Date  . apligraph  12-04-2102,   12-18-2012   bilateral calves   [name]  . biopsy on cerebellum    . COLONOSCOPY WITH PROPOFOL Left 10/07/2015   Procedure: COLONOSCOPY WITH PROPOFOL;  Surgeon: [name], MD;  Location: [facility];  Service: Endoscopy;  Laterality: Left;  . ENDOVENOUS ABLATION SAPHENOUS VEIN W/ LASER  07-24-2012   right greater saphenous vein by [name], M.D.   . ENDOVENOUS ABLATION SAPHENOUS VEIN W/ LASER  10-16-2012   left greater saphenous vein  by Dr. [name]  . ENDOVENOUS ABLATION SAPHENOUS VEIN W/ LASER Right 09-01-2015   endovenous laser ablation right greater saphenous vein by [name] MD  . ESOPHAGOGASTRODUODENOSCOPY N/A 05/07/2016   Procedure: ESOPHAGOGASTRODUODENOSCOPY (EGD);  Surgeon: [name], MD;  Location: [facility] ENDOSCOPY;  Service: Endoscopy;  Laterality: N/A;  . ESOPHAGOGASTRODUODENOSCOPY (EGD) WITH PROPOFOL Left 10/07/2015   Procedure: ESOPHAGOGASTRODUODENOSCOPY (EGD) WITH PROPOFOL;  Surgeon: [name], MD;  Location:  [facility] ENDOSCOPY;  Service: Endoscopy;  Laterality: Left;  . RIGHT HEART CATHETERIZATION N/A 11/14/2012   Procedure: RIGHT HEART CATH;  Surgeon: [name], MD;  Location: [facility] CATH LAB;  Service: Cardiovascular;  Laterality: N/A;  . rt little toe removal  10/1998  . SMALL INTESTINE SURGERY    . stent left thigh  3/11  . TONSILLECTOMY AND ADENOIDECTOMY    . TUNNELED VENOUS CATHETER PLACEMENT  01/2013  .  VEIN LIGATION AND STRIPPING  05/16/2012   Procedure: VEIN LIGATION AND STRIPPING;  Surgeon: [name], MD;  Location: [facility] OR;  Service: Vascular;  Laterality: Right;  Irrigation and Debridement right lower leg, ligation of saphenous vein.    OB History    No data available       Home Medications    Prior to Admission medications   Medication Sig Start Date End Date Taking? Authorizing Provider  acidophilus (RISAQUAD) CAPS Take 1 capsule by mouth every morning.     Historical Provider, MD  aspirin 81 MG tablet Take 1 tablet (81 mg total) by mouth every evening. 05/08/16   Robbie Lis, MD  atorvastatin (LIPITOR) 10 MG tablet Take 1 tablet (10 mg total) by mouth daily. 03/09/16   Hoyt Koch, MD  BIOTIN PO Take 1 tablet by mouth daily.    Historical Provider, MD  cholecalciferol (VITAMIN D) 1000 UNITS tablet Take 2,000 Units by mouth every evening.     Historical Provider, MD  citalopram (CELEXA) 20 MG tablet Take 30 mg by mouth daily.    Historical Provider, MD  clopidogrel (PLAVIX) 75 MG tablet take 1 tablet by mouth once daily 11/23/15   Hoyt Koch, MD  clopidogrel (PLAVIX) 75 MG tablet take 1 tablet by mouth once daily RESUME IN 1 WEEK 07/27/16   Hoyt Koch, MD  fish oil-omega-3 fatty acids 1000 MG capsule Take 1 g by mouth daily with lunch.     Historical Provider, MD  gabapentin (NEURONTIN) 300 MG capsule take 1 capsule by mouth every morning and 2 every evening Patient taking differently: Take 300-600 mg by mouth 2 (two) times daily. take 1 capsule by mouth every morning and 2 every evening 03/09/16   Hoyt Koch, MD  Macitentan (OPSUMIT) 10 MG TABS Take 10 mg by mouth daily. 03/09/16   Hoyt Koch, MD  moxifloxacin (VIGAMOX) 0.5 % ophthalmic solution Place 1 drop into both eyes 3 (three) times daily as needed (allergies).     Historical Provider, MD  multivitamin [facility]) per tablet Take 1 tablet by mouth daily.     Historical Provider,  MD  OXYGEN Inhale 2 L into the lungs continuous.    Historical Provider, MD  pantoprazole (PROTONIX) 40 MG tablet Take 1 tablet (40 mg total) by mouth 2 (two) times daily. 05/08/16   Robbie Lis, MD  POLY-IRON 150 150 MG capsule take 1 capsule by mouth twice a day 04/30/16   Hoyt Koch, MD  potassium chloride (K-DUR) 10 MEQ tablet Take 1 tablet (10 mEq total) by mouth daily. 03/09/16   Hoyt Koch, MD  predniSONE (DELTASONE) 2.5 MG tablet Take 2.5 mg by mouth daily. 04/23/16   Historical Provider, MD  sildenafil (REVATIO) 20 MG tablet Take 2 tablets (40 mg total) by mouth 3 (three) times daily. 05/30/16   [name], MD  spironolactone (ALDACTONE) 25 MG tablet Take 1 tablet (25 mg total) by mouth daily. 03/09/16   Hoyt Koch, MD  torsemide (DEMADEX) 20 MG tablet Take 2 tablets (40 mg total) by mouth 2 (two) times daily. 03/09/16   Hoyt Koch, MD  vitamin B-12 (CYANOCOBALAMIN) 500 MCG tablet Take 500 mcg by mouth every morning.     Historical Provider, MD    Family History Family History  Problem Relation Age of Onset  . Lupus Father   . Lymphoma Father   . Hyperlipidemia Mother   . Cancer Daughter     sarcoma  . Colon cancer      Social History Social History  Substance Use Topics  . Smoking status: Never Smoker  . Smokeless tobacco: Never Used  . Alcohol use 0.0 oz/week     Comment: Occasional     Allergies   Sulfa antibiotics; Levaquin [levofloxacin in d5w]; Adcirca [tadalafil]; and Penicillins   Review of Systems Review of Systems  Constitutional: Negative for chills and fever.  HENT: Negative for ear pain and sore throat.   Eyes: Negative for pain and visual disturbance.  Respiratory: Negative for cough and shortness of breath.   Cardiovascular: Negative for chest pain and palpitations.  Gastrointestinal: Positive for nausea and vomiting. Negative for abdominal pain.  Genitourinary: Negative for dysuria and hematuria.    Musculoskeletal: Negative for arthralgias and back pain.  Skin: Negative for color change and rash.  Neurological: Positive for syncope. Negative for seizures.  All other systems reviewed and are negative.    Physical Exam Updated Vital Signs BP (!) 109/49 (BP Location: Left Arm)   Pulse 71   Temp 98.1 F (36.7 C) (Oral)   Resp 18   Ht _0  (1.702 m)   Wt 61.2 kg   SpO2 99%   BMI 21.14 kg/m   Physical Exam  Constitutional: She appears well-developed and well-nourished. No distress.  HENT:  Head: Normocephalic and atraumatic.  Eyes: Conjunctivae are normal.  Neck: Neck supple.  Cardiovascular: Normal rate and regular rhythm.   No murmur heard. Pulmonary/Chest: Effort normal and breath sounds normal. No respiratory distress.  Abdominal: Soft. There is no tenderness.  Genitourinary: Rectal exam shows guaiac positive stool (gross blood).  Musculoskeletal: She exhibits no edema.  Bilateral extremity contractures  Neurological: She is alert.  Skin: Skin is warm and dry.  Psychiatric: She has a normal mood and affect.  Nursing note and vitals reviewed.    ED Treatments / Results  Labs (all labs ordered are listed, but only abnormal results are displayed) Labs Reviewed - No data to display  EKG  EKG Interpretation None       Radiology No results found.  Procedures Procedures (including critical care time)  Medications Ordered in ED Medications - No data to display   Initial Impression / Assessment and Plan / ED Course  I have reviewed the triage vital signs and the nursing notes.  Pertinent labs & imaging results that were available during my care of the patient were reviewed by me and considered in my medical decision making (see chart for details).  Clinical Course    Patient noted to be stable on initial assessment. Labs ordered.CBC was significant for significant hemoglobin drop over 3 days.  With positive Hemoccult test will be concern for acute  blood loss anemia due to GI bleed. IV Protonix ordered. No abdominal pain that would suggest mesenteric ischemia.EKG, as above shows some inverted T waves similar to prior. Troponin negative x1  Patient admitted to hospitalist for GIB with symptomatic anemia.    Final Clinical Impressions(s) / ED Diagnoses  Final diagnoses:  None    New Prescriptions New Prescriptions   No medications on file     Levada Schilling, MD 07/29/16 [place] Nguyen, MD 07/30/16 2100

## 2016-07-30 ENCOUNTER — Observation Stay (HOSPITAL_COMMUNITY): Payer: BLUE CROSS/BLUE SHIELD

## 2016-07-30 DIAGNOSIS — R55 Syncope and collapse: Secondary | ICD-10-CM | POA: Diagnosis present

## 2016-07-30 DIAGNOSIS — I129 Hypertensive chronic kidney disease with stage 1 through stage 4 chronic kidney disease, or unspecified chronic kidney disease: Secondary | ICD-10-CM | POA: Diagnosis present

## 2016-07-30 DIAGNOSIS — Z7982 Long term (current) use of aspirin: Secondary | ICD-10-CM | POA: Diagnosis not present

## 2016-07-30 DIAGNOSIS — K922 Gastrointestinal hemorrhage, unspecified: Principal | ICD-10-CM

## 2016-07-30 DIAGNOSIS — L97509 Non-pressure chronic ulcer of other part of unspecified foot with unspecified severity: Secondary | ICD-10-CM | POA: Diagnosis present

## 2016-07-30 DIAGNOSIS — F329 Major depressive disorder, single episode, unspecified: Secondary | ICD-10-CM | POA: Diagnosis present

## 2016-07-30 DIAGNOSIS — I272 Other secondary pulmonary hypertension: Secondary | ICD-10-CM | POA: Diagnosis present

## 2016-07-30 DIAGNOSIS — Z79899 Other long term (current) drug therapy: Secondary | ICD-10-CM | POA: Diagnosis not present

## 2016-07-30 DIAGNOSIS — N184 Chronic kidney disease, stage 4 (severe): Secondary | ICD-10-CM | POA: Diagnosis present

## 2016-07-30 DIAGNOSIS — Z8572 Personal history of non-Hodgkin lymphomas: Secondary | ICD-10-CM | POA: Diagnosis not present

## 2016-07-30 DIAGNOSIS — I739 Peripheral vascular disease, unspecified: Secondary | ICD-10-CM | POA: Diagnosis present

## 2016-07-30 DIAGNOSIS — Z22322 Carrier or suspected carrier of Methicillin resistant Staphylococcus aureus: Secondary | ICD-10-CM | POA: Diagnosis not present

## 2016-07-30 DIAGNOSIS — Z880 Allergy status to penicillin: Secondary | ICD-10-CM | POA: Diagnosis not present

## 2016-07-30 DIAGNOSIS — Z9981 Dependence on supplemental oxygen: Secondary | ICD-10-CM | POA: Diagnosis not present

## 2016-07-30 DIAGNOSIS — D62 Acute posthemorrhagic anemia: Secondary | ICD-10-CM | POA: Diagnosis present

## 2016-07-30 DIAGNOSIS — M349 Systemic sclerosis, unspecified: Secondary | ICD-10-CM | POA: Diagnosis present

## 2016-07-30 DIAGNOSIS — Z9221 Personal history of antineoplastic chemotherapy: Secondary | ICD-10-CM | POA: Diagnosis not present

## 2016-07-30 DIAGNOSIS — Z881 Allergy status to other antibiotic agents status: Secondary | ICD-10-CM | POA: Diagnosis not present

## 2016-07-30 DIAGNOSIS — N183 Chronic kidney disease, stage 3 (moderate): Secondary | ICD-10-CM | POA: Diagnosis not present

## 2016-07-30 DIAGNOSIS — Z7902 Long term (current) use of antithrombotics/antiplatelets: Secondary | ICD-10-CM | POA: Diagnosis not present

## 2016-07-30 DIAGNOSIS — I4581 Long QT syndrome: Secondary | ICD-10-CM | POA: Diagnosis not present

## 2016-07-30 DIAGNOSIS — Z888 Allergy status to other drugs, medicaments and biological substances status: Secondary | ICD-10-CM | POA: Diagnosis not present

## 2016-07-30 DIAGNOSIS — E876 Hypokalemia: Secondary | ICD-10-CM | POA: Diagnosis present

## 2016-07-30 DIAGNOSIS — Z85820 Personal history of malignant melanoma of skin: Secondary | ICD-10-CM | POA: Diagnosis not present

## 2016-07-30 DIAGNOSIS — Z807 Family history of other malignant neoplasms of lymphoid, hematopoietic and related tissues: Secondary | ICD-10-CM | POA: Diagnosis not present

## 2016-07-30 DIAGNOSIS — E785 Hyperlipidemia, unspecified: Secondary | ICD-10-CM | POA: Diagnosis present

## 2016-07-30 LAB — CBC
HCT: 27.6 % — ABNORMAL LOW (ref 36.0–46.0)
HCT: 21.2 % — ABNORMAL LOW (ref 36.0–46.0)
Hemoglobin: 8.4 g/dL — ABNORMAL LOW (ref 12.0–15.0)
Hemoglobin: 6.6 g/dL — CL (ref 12.0–15.0)
MCH: 29.4 pg (ref 26.0–34.0)
MCH: 30.3 pg (ref 26.0–34.0)
MCHC: 30.4 g/dL (ref 30.0–36.0)
MCHC: 31.1 g/dL (ref 30.0–36.0)
MCV: 96.5 fL (ref 78.0–100.0)
MCV: 97.2 fL (ref 78.0–100.0)
Platelets: 272 10*3/uL (ref 150–400)
Platelets: 240 10*3/uL (ref 150–400)
RBC: 2.86 MIL/uL — ABNORMAL LOW (ref 3.87–5.11)
RBC: 2.18 MIL/uL — ABNORMAL LOW (ref 3.87–5.11)
RDW: 20.7 % — ABNORMAL HIGH (ref 11.5–15.5)
RDW: 21.2 % — ABNORMAL HIGH (ref 11.5–15.5)
WBC: 7.7 10*3/uL (ref 4.0–10.5)
WBC: 7.2 10*3/uL (ref 4.0–10.5)

## 2016-07-30 LAB — BASIC METABOLIC PANEL
Anion gap: 8 (ref 5–15)
BUN: 99 mg/dL — ABNORMAL HIGH (ref 6–20)
CO2: 32 mmol/L (ref 22–32)
Calcium: 8.1 mg/dL — ABNORMAL LOW (ref 8.9–10.3)
Chloride: 99 mmol/L — ABNORMAL LOW (ref 101–111)
Creatinine, Ser: 1.48 mg/dL — ABNORMAL HIGH (ref 0.44–1.00)
GFR calc Af Amer: 41 mL/min — ABNORMAL LOW (ref >60)
GFR calc non Af Amer: 36 mL/min — ABNORMAL LOW (ref >60)
Glucose, Bld: 119 mg/dL — ABNORMAL HIGH (ref 65–99)
Potassium: 3.3 mmol/L — ABNORMAL LOW (ref 3.5–5.1)
Sodium: 139 mmol/L (ref 135–145)

## 2016-07-30 LAB — HEMOGLOBIN AND HEMATOCRIT, BLOOD
HCT: 27.1 % — ABNORMAL LOW (ref 36.0–46.0)
Hemoglobin: 9 g/dL — ABNORMAL LOW (ref 12.0–15.0)

## 2016-07-30 LAB — PREPARE RBC (CROSSMATCH)

## 2016-07-30 MED ORDER — SODIUM CHLORIDE 0.9 % IV SOLN
Freq: Once | INTRAVENOUS | Status: AC
  Administered 2016-07-30: 10:00:00 via INTRAVENOUS

## 2016-07-30 MED ORDER — TECHNETIUM TC 99M-LABELED RED BLOOD CELLS IV KIT
26.0000 | PACK | Freq: Once | INTRAVENOUS | Status: AC | PRN
  Administered 2016-07-30: 26 via INTRAVENOUS

## 2016-07-30 NOTE — Progress Notes (Signed)
[name], female   DOB: June 04, 1951, 65 y.o.   MRN: [id number]    PROGRESS NOTE    [name] [name]  TRR:[id number] DOB: May 09, 1951 DOA: 07/29/2016  PCP: Hoyt Koch, MD   Brief Narrative:  65 y.o. white female  presented with sudden onset of large bloody bowel movements associated with dizziness and lethargy, poor oral intake. Upon arrival to emergency department hemoglobin was 9.5 and BUN 100. Patient underwent EGD and colonoscopy October 2016 which showed candidal esophagitis and rectal polyp respectively.  Assessment & Plan:   Principal Problem:   Acute blood loss anemia, secondary to GI bleed - Large bloody bowel movement this morning - Also noted drop in hemoglobin from 8.4 --> 6.6 this morning - Patient has been on clear liquids but still reports nausea - GI team is following, appreciate assistance - I will go ahead and order 2 units of PRBC - CBC in the morning  Active Problems:   Hypokalemia - Supplement and repeat BMP in the morning    Pulmonary hypertension associated with systemic disorder ([place]), scleroderma - Outpatient follow-up - On low-dose prednisone daily which has been continued here    Prolonged Q-T interval on ECG - No chest pain this morning, avoid medications that prolong QT specifically Zofran - Keep on telemetry    Acute on CKD (chronic kidney disease), stage III - Creatinine trending down from 1.85 --> 1.48 - BMP in the morning  DVT prophylaxis: SCDs Code Status: Full Family Communication: Patient and husband at bedside  Disposition Plan: Home once cleared by GI team  Consultants:   GI  Procedures:   None  Antimicrobials:   None   Subjective: Had large bloody bowel movement this morning  Objective: Vitals:   07/30/16 0336 07/30/16 0604 07/30/16 0959 07/30/16 1023  BP:  (!) 101/44 (!) 120/47 (!) 110/45  Pulse:  67 71 63  Resp:  _0 Temp:  98.5 F (36.9 C) 98.1 F (36.7 C) 97.9 F (36.6 C)    TempSrc:  Oral Oral Oral  SpO2:  100% 99% 100%  Weight: 61.8 kg (136 lb 3.2 oz)     Height:        Intake/Output Summary (Last 24 hours) at 07/30/16 1213 Last data filed at 07/30/16 1023  Gross per 24 hour  Intake          2606.66 ml  Output             2775 ml  Net          -168.34 ml   Filed Weights   07/29/16 0922 07/29/16 1447 07/30/16 0336  Weight: 61.2 kg (135 lb) 62.4 kg (137 lb 8 oz) 61.8 kg (136 lb 3.2 oz)    Examination:  General exam: Appears calm and comfortable  Respiratory system: Clear to auscultation. Respiratory effort normal. Cardiovascular system: S1 & S2 heard, RRR. No JVD, murmurs, rubs, gallops or clicks. No pedal edema. Gastrointestinal system: Abdomen is nondistended, soft and nontender. No organomegaly or masses felt.  Extremities: Symmetric 5 x 5 power.  Data Reviewed: I have personally reviewed following labs and imaging studies  CBC:  Recent Labs Lab 07/29/16 1000 07/29/16 1243 07/29/16 1825 07/30/16 0347 07/30/16 0605  WBC 8.0 6.5 5.9 7.7 7.2  NEUTROABS 6.7  --   --   --   --   HGB 9.5* 8.9* 7.8* 8.4* 6.6*  HCT 30.7* 29.0* 24.7* 27.6* 21.2*  MCV 97.8 98.6 97.6 96.5 97.2  PLT 290 292 279 272 416   Basic Metabolic Panel:  Recent Labs Lab 07/26/16 1325 07/29/16 1000 07/29/16 1243 07/30/16 0605  NA 136 139  --  139  K 4.0 3.3*  --  3.3*  CL 94* 92*  --  99*  CO2 29 34*  --  32  GLUCOSE 96 108*  --  119*  BUN 75* 104*  --  99*  CREATININE 1.68* 1.85*  --  1.48*  CALCIUM 10.0 9.0  --  8.1*  MG  --   --  2.2  --    Liver Function Tests:  Recent Labs Lab 07/26/16 1325 07/29/16 1000  AST 27 26  ALT 21 19  ALKPHOS 120* 83  BILITOT 0.4 0.6  PROT 8.2 6.5  ALBUMIN 4.4 3.4*    Recent Labs Lab 07/29/16 1000  LIPASE 72*   BNP (last 3 results)  Recent Labs  09/06/15 1221  PROBNP 305.0*   Urine analysis:    Component Value Date/Time   COLORURINE YELLOW 05/06/2016 2057   APPEARANCEUR CLOUDY (A) 05/06/2016 2057    LABSPEC 1.014 05/06/2016 2057   PHURINE 7.0 05/06/2016 2057   GLUCOSEU NEGATIVE 05/06/2016 2057   HGBUR NEGATIVE 05/06/2016 2057   BILIRUBINUR NEGATIVE 05/06/2016 2057   BILIRUBINUR n 08/12/2012 1420   KETONESUR NEGATIVE 05/06/2016 2057   PROTEINUR NEGATIVE 05/06/2016 2057   UROBILINOGEN 0.2 08/12/2012 1420   NITRITE NEGATIVE 05/06/2016 2057   LEUKOCYTESUR SMALL (A) 05/06/2016 2057    Recent Results (from the past 240 hour(s))  MRSA PCR Screening     Status: Abnormal   Collection Time: 07/29/16  2:56 PM  Result Value Ref Range Status   MRSA by PCR POSITIVE (A) NEGATIVE Final    Radiology Studies: No results found.  Scheduled Meds: . sodium chloride   Intravenous Once  . atorvastatin  10 mg Oral Daily  . Chlorhexidine Gluconate Cloth  6 each Topical Q0600  . gabapentin  600 mg Oral BID  . mupirocin ointment   Nasal BID  . predniSONE  5 mg Oral Q breakfast  . sodium chloride flush  3 mL Intravenous Q12H   Continuous Infusions: . sodium chloride 50 mL/hr at 07/30/16 0226  . pantoprozole (PROTONIX) infusion 8 mg/hr (07/30/16 0440)     LOS: 0 days    Time spent: 20 minutes    Faye Ramsay, MD [facility] Pager [phone]  If 7PM-7AM, please contact night-coverage [email or web address] Password [email or web address] 07/30/2016, 12:13 PM

## 2016-07-30 NOTE — Progress Notes (Signed)
[name] [name] 2:57 PM  Subjective: Patient seen and examined and case discussed with her and her husband and her hospital computer chart was reviewed and she's been having more maroon stools and clots today and has been on aspirin Plavix Mobic and ibuprofen at home which we discussed but no other GI complaints  Objective: Vital signs stable afebrile no acute distress lungs are clear regular rate and rhythm abdomen is soft nontender hemoglobin decreased BUN slight decrease creatinine moderate decrease  Assessment: GI bleed in a patient on multiple blood thinners and anti-inflammatories  Plan: Begin workup with a nuclear stat bleeding scan and consider endoscopic evaluation pending those findings and continue clear liquids for now  [facility]  Pager [phone] After 5PM or if no answer call [phone]

## 2016-07-31 ENCOUNTER — Encounter (HOSPITAL_COMMUNITY)
Admission: EM | Disposition: A | Discharge status: Home / Self Care | Payer: Self-pay | Source: Home / Self Care | Attending: Internal Medicine

## 2016-07-31 DIAGNOSIS — E785 Hyperlipidemia, unspecified: Secondary | ICD-10-CM

## 2016-07-31 DIAGNOSIS — I4581 Long QT syndrome: Secondary | ICD-10-CM

## 2016-07-31 DIAGNOSIS — N183 Chronic kidney disease, stage 3 (moderate): Secondary | ICD-10-CM

## 2016-07-31 DIAGNOSIS — E876 Hypokalemia: Secondary | ICD-10-CM

## 2016-07-31 HISTORY — PX: GIVENS CAPSULE STUDY: SHX5432

## 2016-07-31 LAB — CBC
HCT: 25.1 % — ABNORMAL LOW (ref 36.0–46.0)
Hemoglobin: 7.8 g/dL — ABNORMAL LOW (ref 12.0–15.0)
MCH: 29.2 pg (ref 26.0–34.0)
MCHC: 31.1 g/dL (ref 30.0–36.0)
MCV: 94 fL (ref 78.0–100.0)
Platelets: 188 10*3/uL (ref 150–400)
RBC: 2.67 MIL/uL — ABNORMAL LOW (ref 3.87–5.11)
RDW: 20.2 % — ABNORMAL HIGH (ref 11.5–15.5)
WBC: 4.6 10*3/uL (ref 4.0–10.5)

## 2016-07-31 LAB — TYPE AND SCREEN
ABO/RH(D): B POS
Antibody Screen: NEGATIVE
Unit division: 0
Unit division: 0
Unit division: 0

## 2016-07-31 LAB — BASIC METABOLIC PANEL
Anion gap: 5 (ref 5–15)
BUN: 42 mg/dL — ABNORMAL HIGH (ref 6–20)
CO2: 29 mmol/L (ref 22–32)
Calcium: 8.2 mg/dL — ABNORMAL LOW (ref 8.9–10.3)
Chloride: 105 mmol/L (ref 101–111)
Creatinine, Ser: 0.94 mg/dL (ref 0.44–1.00)
GFR calc Af Amer: >60 mL/min (ref >60)
GFR calc non Af Amer: >60 mL/min (ref >60)
Glucose, Bld: 88 mg/dL (ref 65–99)
Potassium: 3.3 mmol/L — ABNORMAL LOW (ref 3.5–5.1)
Sodium: 139 mmol/L (ref 135–145)

## 2016-07-31 SURGERY — IMAGING PROCEDURE, GI TRACT, INTRALUMINAL, VIA CAPSULE
Anesthesia: LOCAL

## 2016-07-31 MED ORDER — SODIUM CHLORIDE 0.9 % IV SOLN
INTRAVENOUS | Status: DC
  Administered 2016-07-31: 250 mL via INTRAVENOUS

## 2016-07-31 MED ORDER — POTASSIUM CHLORIDE CRYS ER 20 MEQ PO TBCR
40.0000 meq | EXTENDED_RELEASE_TABLET | Freq: Once | ORAL | Status: AC
  Administered 2016-07-31: 40 meq via ORAL
  Filled 2016-07-31: qty 2

## 2016-07-31 MED ORDER — COLLAGENASE 250 UNIT/GM EX OINT
TOPICAL_OINTMENT | Freq: Every day | CUTANEOUS | Status: DC
  Administered 2016-07-31 – 2016-08-01 (×2): 1 via TOPICAL
  Filled 2016-07-31: qty 30

## 2016-07-31 SURGICAL SUPPLY — 1 items: TOWEL COTTON PACK 4EA (MISCELLANEOUS) ×4 IMPLANT

## 2016-07-31 NOTE — Consult Note (Signed)
[facility] Nurse wound consult note Reason for Consult:Chronic mixed venous and arterial insufficiency. S/P stents.  Wound type:Chronic nonhealing.  Seen at wound care center.  Pressure Ulcer POA: N/A Measurement:Right anterior pretibial leg 4.2 cm x 3.5 cm x 0.2 cm with adherent slough in wound bed.  Right lateral leg 0.5 cm diameter  Slough to wound bed Right lateral malleolus  1 cm x 1 cm x 0.2 cm  Wound WGY:KZLDJ red and adherent slough Drainage (amount, consistency, odor) moderate serosanguinous  Musty odor.  Periwound:Dry skin. Dimethicone barrier cream to periwound for protection Dressing procedure/placement/frequency:Cleanse right leg with soap and water and pat dry.  Apply Santyl to right anterior leg wound.  Cover with NS moist gauze.  Cover with ABD pad, kerlix and Coban.   Aquacel Ag to wounds near right malleolus.  Change twice weekly.  Will not follow at this time.  Please re-consult if needed.  Domenic Moras RN BSN [place] Pager [phone]

## 2016-07-31 NOTE — Progress Notes (Signed)
Pill cam ingested by pt at 3888 for small bowel capsule study.  Education given, pt verb und.  Floor RN aware.

## 2016-07-31 NOTE — Care Management Note (Signed)
Case Management Note  Patient Details  Name: [name] [name] MRN: [id number] Date of Birth: June 17, 1951  Subjective/Objective:                Spoke with patient at the bedside. Independent patient admitted from home, lives with husband, drives.  Patient admitted with GI Bleed. Continues IV protonix and IVF. Had transfusions. Uses [facility] for home oxygen, not active for [facility]. Goes to wound care center at [facility] for scleroderma, currently has ACE wrap to RLE. Patient denies needs for DME or medication help.     Action/Plan:  Continue to follow for DC planning.   Expected Discharge Date:                  Expected Discharge Plan:  Home/Self Care  In-House Referral:     Discharge planning Services  CM Consult  Post Acute Care Choice:  NA Choice offered to:  NA  DME Arranged:  N/A DME Agency:  NA  HH Arranged:  NA HH Agency:  NA  Status of Service:  Completed, signed off  If discussed at [place] of Stay Meetings, dates discussed:    Additional Comments:  Carles Collet, RN 07/31/2016, 2:28 PM

## 2016-07-31 NOTE — Progress Notes (Signed)
[name], female   DOB: Feb 19, 1951, 65 y.o.   MRN: [id number]  PROGRESS NOTE    [name] [name]  WUJ:[id number] DOB: 03/09/1951 DOA: 07/29/2016  PCP: Hoyt Koch, MD   Brief Narrative:  65 y.o.whitefemalewith past medical history significant for dyslipidemia, depression, scleroderma, pulmonary hypertension, peripheral artery disease, on aspirin and Plavix at home. Patient presented to [facility] with sudden onset of large bloody bowel movement associated with dizziness and lethargy, poor by mouth intake.  In ED, hemoglobin was 9.5, BUN 100. Patient underwent EGD and colonoscopy back in October 2016 with findings significant for candidal esophagitis and rectal polyp respectively.  She has been seen by GI in consultation.  Assessment & Plan:   Principal Problem:   Acute blood loss anemia, secondary to GI bleed - Unclear etiology. Plan for capsule endoscopy per GI today - Patient is status post 2 units of PRBC transfusion 07/30/2016 - Hemoglobin up from 6.6 to 9 and then this morning 7.8 - Continue protonix infusion    Active Problems:   Hypokalemia - Possibly from GI losses. Supplemented - Follow-up BMP tomorrow morning    Pulmonary hypertension associated with systemic disorder (HCC), scleroderma - Continue low-dose prednisone    Prolonged Q-T interval on ECG - No chest pain - Patient is on Protonix infusion so we will recheck 12-lead EKG today to make sure QT interval stable - Continue to monitor on telemetry    Acute on CKD (chronic kidney disease), stage III - Creatinine has improved and within normal limits this morning    Dyslipidemia - Continue statin therapy   DVT prophylaxis: SCDs due to risk of bleeding Code Status: Full Family Communication:  husband at the bedside this morning Disposition Plan: Home once cleared by GI team, possibly by tomorrow   Consultants:   GI  Procedures:   Capsule endoscopy planned for  today   Antimicrobials:   None    Subjective: No overnight events.   Objective: Vitals:   07/30/16 1305 07/30/16 1509 07/30/16 2129 07/31/16 0442  BP: (!) 104/39 (!) 113/45 (!) 129/58   Pulse: 73 60 62   Resp: 16 16    Temp: 98 F (36.7 C) 98.4 F (36.9 C) 99.1 F (37.3 C)   TempSrc: Oral Oral    SpO2: 100% 100% 91%   Weight:    62.1 kg (136 lb 12.8 oz)  Height:        Intake/Output Summary (Last 24 hours) at 07/31/16 1045 Last data filed at 07/31/16 0814  Gross per 24 hour  Intake             1825 ml  Output                0 ml  Net             1825 ml   Filed Weights   07/29/16 1447 07/30/16 0336 07/31/16 0442  Weight: 62.4 kg (137 lb 8 oz) 61.8 kg (136 lb 3.2 oz) 62.1 kg (136 lb 12.8 oz)    Examination:  General exam: Appears calm and comfortable  Respiratory system: Clear to auscultation. Respiratory effort normal. Cardiovascular system: S1 & S2 heard, RRR. No JVD, murmurs, rubs, gallops or clicks. No pedal edema. Gastrointestinal system: Abdomen is nondistended, soft and nontender. No organomegaly or masses felt. Normal bowel sounds heard. Central nervous system: Alert and oriented. No focal neurological deficits. Extremities: Symmetric 5 x 5 power. Skin: No rashes, lesions or ulcers Psychiatry:  Judgement and insight appear normal. Mood & affect appropriate.   Data Reviewed: I have personally reviewed following labs and imaging studies  CBC:  Recent Labs Lab 07/29/16 1000 07/29/16 1243 07/29/16 1825 07/30/16 0347 07/30/16 0605 07/30/16 2019 07/31/16 0541  WBC 8.0 6.5 5.9 7.7 7.2  --  4.6  NEUTROABS 6.7  --   --   --   --   --   --   HGB 9.5* 8.9* 7.8* 8.4* 6.6* 9.0* 7.8*  HCT 30.7* 29.0* 24.7* 27.6* 21.2* 27.1* 25.1*  MCV 97.8 98.6 97.6 96.5 97.2  --  94.0  PLT 290 292 279 272 240  --  779   Basic Metabolic Panel:  Recent Labs Lab 07/26/16 1325 07/29/16 1000 07/29/16 1243 07/30/16 0605 07/31/16 0541  NA 136 139  --  139 139  K 4.0  3.3*  --  3.3* 3.3*  CL 94* 92*  --  99* 105  CO2 29 34*  --  32 29  GLUCOSE 96 108*  --  119* 88  BUN 75* 104*  --  99* 42*  CREATININE 1.68* 1.85*  --  1.48* 0.94  CALCIUM 10.0 9.0  --  8.1* 8.2*  MG  --   --  2.2  --   --    GFR: Estimated Creatinine Clearance: 57.2 mL/min (by C-G formula based on SCr of 0.94 mg/dL). Liver Function Tests:  Recent Labs Lab 07/26/16 1325 07/29/16 1000  AST 27 26  ALT 21 19  ALKPHOS 120* 83  BILITOT 0.4 0.6  PROT 8.2 6.5  ALBUMIN 4.4 3.4*    Recent Labs Lab 07/29/16 1000  LIPASE 72*   No results for input(s): AMMONIA in the last 168 hours. Coagulation Profile: No results for input(s): INR, PROTIME in the last 168 hours. Cardiac Enzymes: No results for input(s): CKTOTAL, CKMB, CKMBINDEX, TROPONINI in the last 168 hours. BNP (last 3 results)  Recent Labs  09/06/15 1221  PROBNP 305.0*   HbA1C: No results for input(s): HGBA1C in the last 72 hours. CBG: No results for input(s): GLUCAP in the last 168 hours. Lipid Profile: No results for input(s): CHOL, HDL, LDLCALC, TRIG, CHOLHDL, LDLDIRECT in the last 72 hours. Thyroid Function Tests: No results for input(s): TSH, T4TOTAL, FREET4, T3FREE, THYROIDAB in the last 72 hours. Anemia Panel: No results for input(s): VITAMINB12, FOLATE, FERRITIN, TIBC, IRON, RETICCTPCT in the last 72 hours. Urine analysis:    Component Value Date/Time   COLORURINE YELLOW 05/06/2016 2057   APPEARANCEUR CLOUDY (A) 05/06/2016 2057   LABSPEC 1.014 05/06/2016 2057   PHURINE 7.0 05/06/2016 2057   GLUCOSEU NEGATIVE 05/06/2016 2057   HGBUR NEGATIVE 05/06/2016 2057   BILIRUBINUR NEGATIVE 05/06/2016 2057   BILIRUBINUR n 08/12/2012 1420   KETONESUR NEGATIVE 05/06/2016 2057   PROTEINUR NEGATIVE 05/06/2016 2057   UROBILINOGEN 0.2 08/12/2012 1420   NITRITE NEGATIVE 05/06/2016 2057   LEUKOCYTESUR SMALL (A) 05/06/2016 2057   Sepsis Labs: _0 (procalcitonin:4,lacticidven:4)   Recent Results (from the  past 240 hour(s))  MRSA PCR Screening     Status: Abnormal   Collection Time: 07/29/16  2:56 PM  Result Value Ref Range Status   MRSA by PCR POSITIVE (A) NEGATIVE Final      Radiology Studies: Nm Gi Blood Loss Result Date: 07/30/2016 CLINICAL DATA:  Negative for active GI bleed. Electronically Signed   By: Inge Rise M.D.   On: 07/30/2016 19:17     Scheduled Meds: . sodium chloride   Intravenous Once  . atorvastatin  10 mg Oral Daily  . Chlorhexidine Gluconate Cloth  6 each Topical Q0600  . collagenase   Topical Daily  . gabapentin  600 mg Oral BID  . mupirocin ointment   Nasal BID  . predniSONE  5 mg Oral Q breakfast  . sodium chloride flush  3 mL Intravenous Q12H   Continuous Infusions: . sodium chloride 50 mL/hr at 07/30/16 2003  . pantoprozole (PROTONIX) infusion 8 mg/hr (07/31/16 0333)     LOS: 1 day    Time spent: 25 minutes  Greater than 50% of the time spent on counseling and coordinating the care.   Leisa Lenz, MD [facility] Pager [phone]  If 7PM-7AM, please contact night-coverage [email or web address] Password [email or web address] 07/31/2016, 10:45 AM

## 2016-07-31 NOTE — Progress Notes (Signed)
[name] [name] 9:33 AM  Subjective: Patient without signs of any further bleeding and no other GI complaints and again we had a long talk with her and her husband about the options and the probable causes and we answered all of their questions  Objective: Vital signs stable afebrile no acute distress abdomen is soft nontender hemoglobin decreased but BUN and creatinine significantly decreased nuclear medicine bleeding scan negative  Assessment: GI blood loss probably related to aspirin and arthritis pills and Plavix  Plan: We discussed endoscopy colonoscopy and capsule endoscopy and will proceed with a capsule endoscopy today and if no signs of bleeding can slowly advance diet per the capsule instructions and possibly be discharged tomorrow but patient does need to know what is the bare minimum of blood thinners she needs to be on and she has seen cardiology in the past but hopefully she does not require both aspirin and Plavix in the future  [facility] E  Pager [phone] After 5PM or if no answer call [phone]

## 2016-08-01 ENCOUNTER — Encounter (HOSPITAL_COMMUNITY): Payer: Self-pay | Admitting: Gastroenterology

## 2016-08-01 LAB — CBC
HCT: 26.7 % — ABNORMAL LOW (ref 36.0–46.0)
Hemoglobin: 8.3 g/dL — ABNORMAL LOW (ref 12.0–15.0)
MCH: 30.3 pg (ref 26.0–34.0)
MCHC: 31.1 g/dL (ref 30.0–36.0)
MCV: 97.4 fL (ref 78.0–100.0)
Platelets: 204 10*3/uL (ref 150–400)
RBC: 2.74 MIL/uL — ABNORMAL LOW (ref 3.87–5.11)
RDW: 20.2 % — ABNORMAL HIGH (ref 11.5–15.5)
WBC: 6.2 10*3/uL (ref 4.0–10.5)

## 2016-08-01 LAB — BASIC METABOLIC PANEL
Anion gap: 4 — ABNORMAL LOW (ref 5–15)
BUN: 19 mg/dL (ref 6–20)
CO2: 27 mmol/L (ref 22–32)
Calcium: 8.2 mg/dL — ABNORMAL LOW (ref 8.9–10.3)
Chloride: 111 mmol/L (ref 101–111)
Creatinine, Ser: 0.78 mg/dL (ref 0.44–1.00)
GFR calc Af Amer: >60 mL/min (ref >60)
GFR calc non Af Amer: >60 mL/min (ref >60)
Glucose, Bld: 91 mg/dL (ref 65–99)
Potassium: 4.1 mmol/L (ref 3.5–5.1)
Sodium: 142 mmol/L (ref 135–145)

## 2016-08-01 NOTE — Discharge Summary (Signed)
Physician Discharge Summary  [name] [name] QRF:[id number] DOB: [birth year] DOA: 07/29/2016  PCP: Hoyt Koch, MD  Admit date: 07/29/2016 Discharge date: 08/01/2016  Recommendations for Outpatient Follow-up:  Please hold aspirin and continue plavix  Please follow up with your cardiologist by 8/7 who can recommend whether it is safe to continue to continue plavix and resume aspirin.  F/U with GI per scheduled appointment in regards to capsule endoscopy results.  Discharge Diagnoses:  Principal Problem:   Gastrointestinal bleed Active Problems:   Hyperlipidemia   Pulmonary hypertension associated with systemic disorder (HCC)   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HCC)   Hypokalemia   Depression   Scleroderma (HCC)   Prolonged Q-T interval on ECG   CKD (chronic kidney disease), stage III    Discharge Condition: stable   Diet recommendation: as tolerated   History of present illness:  65 y.o.whitefemalewith past medical history significant for dyslipidemia, depression, scleroderma, pulmonary hypertension, peripheral artery disease, on aspirin and Plavix at home. Patient presented to [facility] with sudden onset of large bloody bowel movement associated with dizziness and lethargy, poor by mouth intake.  In ED, hemoglobin was 9.5, BUN 100. Patient underwent EGD and colonoscopy back in October 2016 with findings significant for candidal esophagitis and rectal polyp respectively.  She has been seen by GI in consultation.Capsule endoscopy done 8/1.  Hospital Course:   Assessment & Plan:  Principal Problem: Acute blood loss anemia, secondary to GI bleed - Unclear etiology. Capsule endo 8/1 - Outpt results  - Patient is status post 2 units of PRBC transfusion 07/30/2016 - Hemoglobin stable this am  - Continue protonix    Active Problems: Hypokalemia - Possibly from GI losses. Supplemented  Pulmonary hypertension associated with systemic disorder (HCC),  scleroderma - Continue low-dose prednisone  Prolonged Q-T interval on ECG - No chest pain - Patient is on Protonix as her home med and to protect form GI bleed as she is on prednsione, plavix   Acute on CKD (chronic kidney disease), stage III - Creatinine has improved and within normal limits     Dyslipidemia - Continue statin therapy   DVT prophylaxis:SCDs due to risk of bleeding Code Status:Full Family Communication: husband at the bedside this morning   Consultants:  GI  Procedures:  Capsule endoscopy planned for today   Antimicrobials:   None   Signed:  Leisa Lenz, MD  [facility] 08/01/2016, 1:29 PM  Pager #: [phone]  Time spent in minutes: more than 30 minutes    Discharge Exam: Vitals:   07/31/16 2150 08/01/16 0615  BP: (!) 110/50 114/62  Pulse: 65 72  Resp: 16 19  Temp: (!) 102.2 F (39 C) 98.4 F (36.9 C)   Vitals:   07/31/16 1107 07/31/16 2150 08/01/16 0615 08/01/16 0632  BP: (!) 101/41 (!) 110/50 114/62   Pulse: (!) 56 65 72   Resp: _0 Temp: 97.9 F (36.6 C) (!) 102.2 F (39 C) 98.4 F (36.9 C)   TempSrc: Oral Oral Oral   SpO2:  96% 93%   Weight:    63.6 kg (140 lb 3.2 oz)  Height:        General: Pt is alert, follows commands appropriately, not in acute distress Cardiovascular: Regular rate and rhythm, S1/S2 +, no murmurs Respiratory: Clear to auscultation bilaterally, no wheezing, no crackles, no rhonchi Abdominal: Soft, non tender, non distended, bowel sounds +, no guarding Extremities: no edema, no cyanosis, pulses palpable bilaterally DP and PT  Neuro: Grossly nonfocal  Discharge Instructions  Discharge Instructions    Call MD for:  persistant dizziness or light-headedness    Complete by:  As directed   Call MD for:  persistant nausea and vomiting    Complete by:  As directed   Call MD for:  severe uncontrolled pain    Complete by:  As directed   Diet - low sodium heart healthy     Complete by:  As directed   Discharge instructions    Complete by:  As directed      Increase activity slowly    Complete by:  As directed       Medication List    STOP taking these medications   aspirin 81 MG tablet     TAKE these medications   acidophilus Caps capsule Take 1 capsule by mouth every morning.   atorvastatin 10 MG tablet Commonly known as:  LIPITOR Take 1 tablet (10 mg total) by mouth daily.   BIOTIN PO Take 1 tablet by mouth daily.   cholecalciferol 1000 units tablet Commonly known as:  VITAMIN D Take 2,000 Units by mouth every evening.   citalopram 20 MG tablet Commonly known as:  CELEXA Take 30 mg by mouth daily.   clopidogrel 75 MG tablet Commonly known as:  PLAVIX take 1 tablet by mouth once daily What changed:  Another medication with the same name was removed. Continue taking this medication, and follow the directions you see here.   fish oil-omega-3 fatty acids 1000 MG capsule Take 1 g by mouth 2 (two) times daily.   gabapentin 300 MG capsule Commonly known as:  NEURONTIN Take 600 mg by mouth 2 (two) times daily. What changed:  Another medication with the same name was changed. Make sure you understand how and when to take each.   gabapentin 300 MG capsule Commonly known as:  NEURONTIN take 1 capsule by mouth every morning and 2 every evening What changed:  how much to take  how to take this  when to take this  additional instructions   Macitentan 10 MG Tabs Commonly known as:  OPSUMIT Take 10 mg by mouth daily.   moxifloxacin 0.5 % ophthalmic solution Commonly known as:  VIGAMOX Place 1 drop into both eyes 3 (three) times daily as needed (allergies).   multivitamin per tablet Take 1 tablet by mouth daily.   OXYGEN Inhale 2 L into the lungs continuous.   pantoprazole 40 MG tablet Commonly known as:  PROTONIX Take 1 tablet (40 mg total) by mouth 2 (two) times daily.   POLY-IRON 150 150 MG capsule Generic drug:  iron  polysaccharides take 1 capsule by mouth twice a day   potassium chloride 10 MEQ tablet Commonly known as:  K-DUR take 1 tablet by mouth once daily What changed:  See the new instructions.   predniSONE 5 MG tablet Commonly known as:  DELTASONE Take 5 mg by mouth daily with breakfast. What changed:  Another medication with the same name was removed. Continue taking this medication, and follow the directions you see here.   sildenafil 20 MG tablet Commonly known as:  REVATIO Take 2 tablets (40 mg total) by mouth 3 (three) times daily.   spironolactone 25 MG tablet Commonly known as:  ALDACTONE Take 1 tablet (25 mg total) by mouth daily.   torsemide 20 MG tablet Commonly known as:  DEMADEX Take 2 tablets (40 mg total) by mouth 2 (two) times daily.   vitamin B-12 500  MCG tablet Commonly known as:  CYANOCOBALAMIN Take 500 mcg by mouth every morning.            The results of significant diagnostics from this hospitalization (including imaging, microbiology, ancillary and laboratory) are listed below for reference.    Significant Diagnostic Studies: Nm Gi Blood Loss  Result Date: 07/30/2016 CLINICAL DATA:  Gastrointestinal bleeding. EXAM: NUCLEAR MEDICINE GASTROINTESTINAL BLEEDING SCAN TECHNIQUE: Sequential abdominal images were obtained following intravenous administration of Tc-23mlabeled red blood cells. RADIOPHARMACEUTICALS:  26.0 mCi Tc-953mn-vitro labeled red cells. COMPARISON:  None. FINDINGS: Scanning was performed for 2 hours. No evidence of active GI bleeding is identified. Soft tissue structures are unremarkable. IMPRESSION: Negative for active GI bleed. Electronically Signed   By: ThInge Rise.D.   On: 07/30/2016 19:17    Microbiology: Recent Results (from the past 240 hour(s))  MRSA PCR Screening     Status: Abnormal   Collection Time: 07/29/16  2:56 PM  Result Value Ref Range Status   MRSA by PCR POSITIVE (A) NEGATIVE Final    Comment:        The  GeneXpert MRSA Assay (FDA approved for NASAL specimens only), is one component of a comprehensive MRSA colonization surveillance program. It is not intended to diagnose MRSA infection nor to guide or monitor treatment for MRSA infections. RESULT CALLED TO, READ BACK BY AND VERIFIED WITH: SMITH,V RN 07/29/16 16[place]el    Labs: Basic Metabolic Panel:  Recent Labs Lab 07/26/16 1325 07/29/16 1000 07/29/16 1243 07/30/16 0605 07/31/16 0541 08/01/16 0838  NA 136 139  --  139 139 142  K 4.0 3.3*  --  3.3* 3.3* 4.1  CL 94* 92*  --  99* 105 111  CO2 29 34*  --  32 29 27  GLUCOSE 96 108*  --  119* 88 91  BUN 75* 104*  --  99* 42* 19  CREATININE 1.68* 1.85*  --  1.48* 0.94 0.78  CALCIUM 10.0 9.0  --  8.1* 8.2* 8.2*  MG  --   --  2.2  --   --   --    Liver Function Tests:  Recent Labs Lab 07/26/16 1325 07/29/16 1000  AST 27 26  ALT 21 19  ALKPHOS 120* 83  BILITOT 0.4 0.6  PROT 8.2 6.5  ALBUMIN 4.4 3.4*    Recent Labs Lab 07/29/16 1000  LIPASE 72*   No results for input(s): AMMONIA in the last 168 hours. CBC:  Recent Labs Lab 07/29/16 1000  07/29/16 1825 07/30/16 0347 07/30/16 0605 07/30/16 2019 07/31/16 0541 08/01/16 0838  WBC 8.0  < > 5.9 7.7 7.2  --  4.6 6.2  NEUTROABS 6.7  --   --   --   --   --   --   --   HGB 9.5*  < > 7.8* 8.4* 6.6* 9.0* 7.8* 8.3*  HCT 30.7*  < > 24.7* 27.6* 21.2* 27.1* 25.1* 26.7*  MCV 97.8  < > 97.6 96.5 97.2  --  94.0 97.4  PLT 290  < > 279 272 240  --  188 204  < > = values in this interval not displayed. Cardiac Enzymes: No results for input(s): CKTOTAL, CKMB, CKMBINDEX, TROPONINI in the last 168 hours. BNP: BNP (last 3 results) No results for input(s): BNP in the last 8760 hours.  ProBNP (last 3 results)  Recent Labs  09/06/15 1221  PROBNP 305.0*    CBG: No results for input(s): GLUCAP in  the last 168 hours.

## 2016-08-01 NOTE — Progress Notes (Signed)
Nsg Discharge Note  Admit Date:  07/29/2016 Discharge date: 08/01/2016   [name] [name] to be D/C'd Home per MD order.  AVS completed.  Copy for chart, and copy for patient signed, and dated. Patient/caregiver able to verbalize understanding.  Discharge Medication:   Medication List    STOP taking these medications   aspirin 81 MG tablet     TAKE these medications   acidophilus Caps capsule Take 1 capsule by mouth every morning.   atorvastatin 10 MG tablet Commonly known as:  LIPITOR Take 1 tablet (10 mg total) by mouth daily.   BIOTIN PO Take 1 tablet by mouth daily.   cholecalciferol 1000 units tablet Commonly known as:  VITAMIN D Take 2,000 Units by mouth every evening.   citalopram 20 MG tablet Commonly known as:  CELEXA Take 30 mg by mouth daily.   clopidogrel 75 MG tablet Commonly known as:  PLAVIX take 1 tablet by mouth once daily What changed:  Another medication with the same name was removed. Continue taking this medication, and follow the directions you see here.   fish oil-omega-3 fatty acids 1000 MG capsule Take 1 g by mouth 2 (two) times daily.   gabapentin 300 MG capsule Commonly known as:  NEURONTIN Take 600 mg by mouth 2 (two) times daily. What changed:  Another medication with the same name was changed. Make sure you understand how and when to take each.   gabapentin 300 MG capsule Commonly known as:  NEURONTIN take 1 capsule by mouth every morning and 2 every evening What changed:  how much to take  how to take this  when to take this  additional instructions   Macitentan 10 MG Tabs Commonly known as:  OPSUMIT Take 10 mg by mouth daily.   moxifloxacin 0.5 % ophthalmic solution Commonly known as:  VIGAMOX Place 1 drop into both eyes 3 (three) times daily as needed (allergies).   multivitamin per tablet Take 1 tablet by mouth daily.   OXYGEN Inhale 2 L into the lungs continuous.   pantoprazole 40 MG tablet Commonly known as:   PROTONIX Take 1 tablet (40 mg total) by mouth 2 (two) times daily.   POLY-IRON 150 150 MG capsule Generic drug:  iron polysaccharides take 1 capsule by mouth twice a day   potassium chloride 10 MEQ tablet Commonly known as:  K-DUR take 1 tablet by mouth once daily What changed:  See the new instructions.   predniSONE 5 MG tablet Commonly known as:  DELTASONE Take 5 mg by mouth daily with breakfast. What changed:  Another medication with the same name was removed. Continue taking this medication, and follow the directions you see here.   sildenafil 20 MG tablet Commonly known as:  REVATIO Take 2 tablets (40 mg total) by mouth 3 (three) times daily.   spironolactone 25 MG tablet Commonly known as:  ALDACTONE Take 1 tablet (25 mg total) by mouth daily.   torsemide 20 MG tablet Commonly known as:  DEMADEX Take 2 tablets (40 mg total) by mouth 2 (two) times daily.   vitamin B-12 500 MCG tablet Commonly known as:  CYANOCOBALAMIN Take 500 mcg by mouth every morning.       Discharge Assessment: Vitals:   08/01/16 0615 08/01/16 1340  BP: 114/62 (!) 120/49  Pulse: 72 76  Resp: 19 17  Temp: 98.4 F (36.9 C) 98.8 F (37.1 C)   Skin clean, dry and intact without evidence of skin break down, no evidence  of skin tears noted. IV catheter discontinued intact. Site without signs and symptoms of complications - no redness or edema noted at insertion site, patient denies c/o pain - only slight tenderness at site.  Dressing with slight pressure applied.  D/c Instructions-Education: Discharge instructions given to patient/family with verbalized understanding. D/c education completed with patient/family including follow up instructions, medication list, d/c activities limitations if indicated, with other d/c instructions as indicated by MD - patient able to verbalize understanding, all questions fully answered. Patient instructed to return to ED, call 911, or call MD for any changes in  condition.  Patient escorted via [place], and D/C home via private auto.  Dayle Points, RN 08/01/2016 2:07 PM

## 2016-08-01 NOTE — Progress Notes (Signed)
[name] [name] 1:07 PM  Subjective: Patient without any signs of bleeding and completed her capsule endoscopy and tolerating regular diet unfortunately I have not read the capsule yet but will later today if available if not first thing tomorrow  Objective: Vital signs stable afebrile no acute distress abdomen is soft nontender  Assessment: Recurrent GI bleeding questionable etiology worse with aspirin nonsteroidals and Plavix  Plan: Okay with me to go home and I will call her cell phone with the results and then we will set up outpatient follow-up with either myself or Dr. Oletta Lamas however she once to go to the beach next week and she'll need some guidance on what is the bare minimum of blood thinners she can take and would ask cardiology if we can use either plavix only or a baby aspirin only or even hold for a few weeks until workup can be completed  [facility] E  Pager [phone] After 5PM or if no answer call [phone]

## 2016-08-02 ENCOUNTER — Encounter (HOSPITAL_BASED_OUTPATIENT_CLINIC_OR_DEPARTMENT_OTHER): Payer: BLUE CROSS/BLUE SHIELD | Attending: Internal Medicine

## 2016-08-02 DIAGNOSIS — L942 Calcinosis cutis: Secondary | ICD-10-CM | POA: Diagnosis not present

## 2016-08-02 DIAGNOSIS — L97411 Non-pressure chronic ulcer of right heel and midfoot limited to breakdown of skin: Secondary | ICD-10-CM | POA: Insufficient documentation

## 2016-08-02 DIAGNOSIS — B954 Other streptococcus as the cause of diseases classified elsewhere: Secondary | ICD-10-CM | POA: Insufficient documentation

## 2016-08-02 DIAGNOSIS — L97819 Non-pressure chronic ulcer of other part of right lower leg with unspecified severity: Secondary | ICD-10-CM | POA: Insufficient documentation

## 2016-08-02 DIAGNOSIS — Z7902 Long term (current) use of antithrombotics/antiplatelets: Secondary | ICD-10-CM | POA: Insufficient documentation

## 2016-08-02 DIAGNOSIS — L97811 Non-pressure chronic ulcer of other part of right lower leg limited to breakdown of skin: Secondary | ICD-10-CM | POA: Insufficient documentation

## 2016-08-02 DIAGNOSIS — L97511 Non-pressure chronic ulcer of other part of right foot limited to breakdown of skin: Secondary | ICD-10-CM | POA: Insufficient documentation

## 2016-08-03 ENCOUNTER — Other Ambulatory Visit: Payer: Self-pay | Admitting: Internal Medicine

## 2016-08-03 ENCOUNTER — Other Ambulatory Visit: Payer: Self-pay | Admitting: *Deleted

## 2016-08-03 MED ORDER — POLYSACCHARIDE IRON COMPLEX 150 MG PO CAPS: 150.0000 mg | ORAL_CAPSULE | Freq: Two times a day (BID) | ORAL | 5 refills | Status: DC

## 2016-08-07 ENCOUNTER — Ambulatory Visit: Payer: BLUE CROSS/BLUE SHIELD | Admitting: Emergency Medicine

## 2016-08-09 ENCOUNTER — Encounter: Payer: Self-pay | Admitting: Vascular Surgery

## 2016-08-10 ENCOUNTER — Other Ambulatory Visit: Payer: Self-pay | Admitting: *Deleted

## 2016-08-10 DIAGNOSIS — M7989 Other specified soft tissue disorders: Secondary | ICD-10-CM

## 2016-08-14 ENCOUNTER — Other Ambulatory Visit: Payer: Self-pay

## 2016-08-14 ENCOUNTER — Encounter: Payer: Self-pay | Admitting: Vascular Surgery

## 2016-08-14 ENCOUNTER — Ambulatory Visit (INDEPENDENT_AMBULATORY_CARE_PROVIDER_SITE_OTHER): Payer: BLUE CROSS/BLUE SHIELD | Admitting: Vascular Surgery

## 2016-08-14 ENCOUNTER — Ambulatory Visit (HOSPITAL_COMMUNITY)
Admission: RE | Admit: 2016-08-14 | Discharge: 2016-08-14 | Disposition: A | Discharge status: Home / Self Care | Payer: BLUE CROSS/BLUE SHIELD | Source: Ambulatory Visit | Attending: Vascular Surgery | Admitting: Vascular Surgery

## 2016-08-14 VITALS — BP 115/64 | HR 60 | Temp 98.4°F | Resp 18 | Ht 67.0 in | Wt 137.1 lb

## 2016-08-14 DIAGNOSIS — M7989 Other specified soft tissue disorders: Secondary | ICD-10-CM | POA: Diagnosis not present

## 2016-08-14 DIAGNOSIS — L97219 Non-pressure chronic ulcer of right calf with unspecified severity: Principal | ICD-10-CM

## 2016-08-14 DIAGNOSIS — I739 Peripheral vascular disease, unspecified: Secondary | ICD-10-CM | POA: Diagnosis not present

## 2016-08-14 DIAGNOSIS — I83012 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: Secondary | ICD-10-CM

## 2016-08-14 DIAGNOSIS — I8391 Asymptomatic varicose veins of right lower extremity: Secondary | ICD-10-CM | POA: Insufficient documentation

## 2016-08-14 DIAGNOSIS — I10 Essential (primary) hypertension: Secondary | ICD-10-CM | POA: Insufficient documentation

## 2016-08-14 NOTE — Progress Notes (Signed)
 Patient name: [name] [name] MRN: [id number] DOB: 10/27/1951 Sex: female  REASON FOR VISIT: Follow-up of extensive right leg venous ulceration.  HPI: [name] [name] is a 65 y.o. female seen today for follow-up of her venous ulceration. She is well-known to me from prior ablation of her great saphenous vein. She had severe venous hypertension with severe venous ulceration. This has progressed despite maximal medical therapy with the wound center. She has multiple very large deep ulcers over calf extending down onto her ankle. She does have known arterial insufficiency with a prior history of bilateral lower extremity stenting in [name] [name] several years ago. She is on chronic prednisone therapy related to her scleroderma.  Past Medical History:  Diagnosis Date  . Anemia   . DOE (dyspnea on exertion)   . Epistaxis   . History of chicken pox   . Hypertension   . Melanoma (HCC) 1999  . 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HCC)   . Non Hodgkin's lymphoma (HCC)    Treated with chemo 2010, felt to be cured.  . PAH (pulmonary artery hypertension) (HCC)   . Peripheral arterial occlusive disease (HCC)   . Pulmonary hypertension (HCC)   . Sclerodermia generalized (HCC)     Family History  Problem Relation Age of Onset  . Lupus Father   . Lymphoma Father   . Hyperlipidemia Mother   . Cancer Daughter     sarcoma  . Colon cancer      SOCIAL HISTORY: Social History  Substance Use Topics  . Smoking status: Never Smoker  . Smokeless tobacco: Never Used  . Alcohol use 0.0 oz/week     Comment: Occasional    Allergies  Allergen Reactions  . Sulfa Antibiotics     Severe rash with significant peeling of face. No airway involvement per patient  . Levaquin [Levofloxacin In D5w] Other (See Comments)    FLU LIKE SYMPTOM  . Penicillins Rash    Current Outpatient Prescriptions  Medication Sig Dispense Refill  . acidophilus (RISAQUAD) CAPS Take 1 capsule by mouth every morning.     .  atorvastatin (LIPITOR) 10 MG tablet Take 1 tablet (10 mg total) by mouth daily. 30 tablet 1  . BIOTIN PO Take 1 tablet by mouth daily.    . cholecalciferol (VITAMIN D) 1000 UNITS tablet Take 2,000 Units by mouth every evening.     . citalopram (CELEXA) 20 MG tablet Take 40 mg by mouth daily.     . clopidogrel (PLAVIX) 75 MG tablet take 1 tablet by mouth once daily 30 tablet 5  . fish oil-omega-3 fatty acids 1000 MG capsule Take 1 g by mouth 2 (two) times daily.     . gabapentin (NEURONTIN) 300 MG capsule take 1 capsule by mouth every morning and 2 every evening (Patient taking differently: Take 300 mg by mouth 2 (two) times daily. take 2 capsules by mouth every morning and 2 every evening) 180 capsule 1  . iron polysaccharides (POLY-IRON 150) 150 MG capsule Take 1 capsule (150 mg total) by mouth 2 (two) times daily. 60 capsule 5  . Macitentan (OPSUMIT) 10 MG TABS Take 10 mg by mouth daily. 30 tablet 1  . moxifloxacin (VIGAMOX) 0.5 % ophthalmic solution Place 1 drop into both eyes 3 (three) times daily as needed (allergies).     . multivitamin (THERAGRAN) per tablet Take 1 tablet by mouth daily.     . OXYGEN Inhale 2 L into the lungs continuous.    . pantoprazole (  PROTONIX) 40 MG tablet Take 1 tablet (40 mg total) by mouth 2 (two) times daily. 60 tablet 1  . potassium chloride (K-DUR) 10 MEQ tablet take 1 tablet by mouth once daily 30 tablet 1  . predniSONE (DELTASONE) 5 MG tablet Take 5 mg by mouth daily with breakfast.    . sildenafil (REVATIO) 20 MG tablet Take 2 tablets (40 mg total) by mouth 3 (three) times daily. 180 tablet 6  . spironolactone (ALDACTONE) 25 MG tablet Take 1 tablet (25 mg total) by mouth daily. 30 tablet 1  . torsemide (DEMADEX) 20 MG tablet Take 2 tablets (40 mg total) by mouth 2 (two) times daily. 120 tablet 1  . vitamin B-12 (CYANOCOBALAMIN) 500 MCG tablet Take 500 mcg by mouth every morning.     . gabapentin (NEURONTIN) 300 MG capsule Take 600 mg by mouth 2 (two) times  daily.     No current facility-administered medications for this visit.     REVIEW OF SYSTEMS:  _0  denotes positive finding, _1  denotes negative finding Cardiac  Comments:  Chest pain or chest pressure:    Shortness of breath upon exertion:    Short of breath when lying flat:    Irregular heart rhythm:        Vascular    Pain in calf, thigh, or hip brought on by ambulation:    Pain in feet at night that wakes you up from your sleep:     Blood clot in your veins:    Leg swelling:         Pulmonary    Oxygen at home: x   Productive cough:     Wheezing:         Neurologic    Sudden weakness in arms or legs:     Sudden numbness in arms or legs:     Sudden onset of difficulty speaking or slurred speech:    Temporary loss of vision in one eye:     Problems with dizziness:         Gastrointestinal    Blood in stool:     Vomited blood:         Genitourinary    Burning when urinating:     Blood in urine:        Psychiatric    Major depression:         Hematologic    Bleeding problems:    Problems with blood clotting too easily:        Skin    Rashes or ulcers: x       Constitutional    Fever or chills:      PHYSICAL EXAM: Vitals:   08/14/16 1408  BP: 115/64  Pulse: 60  Resp: 18  Temp: 98.4 F (36.9 C)  TempSrc: Oral  Weight: 137 lb 1.6 oz (62.2 kg)  Height: 5' 7" (1.702 m)    GENERAL: The patient is a well-nourished female, in no acute distress. The vital signs are documented above. CARDIAC: There is a regular rate and rhythm.  VASCULAR: Do not palpate pedal pulses bilaterally. She does have Doppler flow in her feet bilaterally.  PULMONARY: There is good air exchange . On nasal oxygen  MUSCULOSKELETAL:Deformities related to scleroderma  NEUROLOGIC: No focal weakness or paresthesias are detected. SKIN:Marked edema in her right leg with large ulcerations with moderately poor granulation tissue at the base of these.  PSYCHIATRIC: The patient has a normal  affect.  DATA:   Venous duplex-shows successful  ablation of her right great saphenous vein. She does have reflux throughout her deep system on the right.  MEDICAL ISSUES:  Long discussion with patient and her husband present. Explained there is no other options regarding treatment of her venous hypertension. She does have moderate arterial insufficiency. We have discussed in the past potential evaluation of this for possible re-intervention. Since she has had progression of her ulceration despite treatment of her superficial system, I would recommend arteriography for further evaluation to determine if any options are possible. I we'll schedule her for outpatient arteriography for further evaluation   Early, Todd [facility] [employer] [phone]

## 2016-08-15 ENCOUNTER — Telehealth: Payer: Self-pay | Admitting: Emergency Medicine

## 2016-08-15 NOTE — Telephone Encounter (Signed)
Pt called and wants to know if the no show from 06/13/16 can be removed. She called and cancelled appt. This is her first now show with this office in 2 yrs. Thanks.

## 2016-08-15 NOTE — Telephone Encounter (Signed)
email sent to billing to remove

## 2016-08-16 ENCOUNTER — Other Ambulatory Visit (HOSPITAL_BASED_OUTPATIENT_CLINIC_OR_DEPARTMENT_OTHER): Payer: Self-pay | Admitting: General Surgery

## 2016-08-16 ENCOUNTER — Ambulatory Visit (HOSPITAL_COMMUNITY)
Admission: RE | Admit: 2016-08-16 | Discharge: 2016-08-16 | Disposition: A | Discharge status: Home / Self Care | Payer: BLUE CROSS/BLUE SHIELD | Source: Ambulatory Visit | Attending: General Surgery | Admitting: General Surgery

## 2016-08-16 DIAGNOSIS — M869 Osteomyelitis, unspecified: Secondary | ICD-10-CM

## 2016-08-16 DIAGNOSIS — M85871 Other specified disorders of bone density and structure, right ankle and foot: Secondary | ICD-10-CM | POA: Insufficient documentation

## 2016-08-16 DIAGNOSIS — L97311 Non-pressure chronic ulcer of right ankle limited to breakdown of skin: Secondary | ICD-10-CM | POA: Insufficient documentation

## 2016-08-16 DIAGNOSIS — L97811 Non-pressure chronic ulcer of other part of right lower leg limited to breakdown of skin: Secondary | ICD-10-CM | POA: Diagnosis not present

## 2016-08-21 ENCOUNTER — Encounter: Payer: Self-pay | Admitting: Emergency Medicine

## 2016-08-21 ENCOUNTER — Ambulatory Visit (INDEPENDENT_AMBULATORY_CARE_PROVIDER_SITE_OTHER): Payer: BLUE CROSS/BLUE SHIELD | Admitting: Emergency Medicine

## 2016-08-21 DIAGNOSIS — I272 Other secondary pulmonary hypertension: Secondary | ICD-10-CM

## 2016-08-21 DIAGNOSIS — L940 Localized scleroderma [morphea]: Secondary | ICD-10-CM

## 2016-08-21 DIAGNOSIS — I2729 Other secondary pulmonary hypertension: Secondary | ICD-10-CM

## 2016-08-21 DIAGNOSIS — K922 Gastrointestinal hemorrhage, unspecified: Secondary | ICD-10-CM | POA: Diagnosis not present

## 2016-08-21 NOTE — Assessment & Plan Note (Signed)
Continue your oxygen at 2L/min pulsed.  Continue Oprumit and revatio as you are you are taking them

## 2016-08-21 NOTE — Patient Instructions (Signed)
Continue your oxygen at 2L/min pulsed.  Continue Oprumit and revatio as you are you are taking them Continue your prednisone at 50m daily until otherwise directed by Dr BAmil Amen  Follow with Dr EOletta Lamasregarding timing and appropriateness of iron infusions.  Follow with Dr BLamonte Sakaiin 6 months or sooner if you have any problems

## 2016-08-21 NOTE — Assessment & Plan Note (Signed)
  Follow with Dr Oletta Lamas regarding timing and appropriateness of iron infusions.

## 2016-08-21 NOTE — Assessment & Plan Note (Signed)
Continue your prednisone at 16m daily until otherwise directed by Dr BAmil Amen

## 2016-08-21 NOTE — Progress Notes (Signed)
Subjective:    [name], female    DOB: Jan 12, 1951, 65 y.o.   MRN: [id number] HPI 65 yo woman, never smoker, hx of scleroderma, Raynaud's, non-Hodgkins lymphoma (treated '10), HTN, PAH originally dx in [place] and treated, but meds d/c'd when she improved. Has been seen in by Dr [name], had R heart cath as below in 03/2012. She has been restarted on therapy, seen both in [place] and at [facility] >> Tracleer + Revatio + Veletri (since 2/'14). She has felt worse on IV therapy, progressive as it titrated up.  Dr Gilles Chiquito scaled back her veletri for this reason. She has had progressive dyspnea - was just started prednisone 78m qd. She has benefited significantly, better exertional tolerance and SpO2.    Underwent RHC on 04/18/12 as below:  RA = 9  RV = 75/11/14  PA = 71/25 (44)  PCW = 4  Fick cardiac output/index = 3.0/1.7  Thermo CO/CI = 2.7/1.5  PVR = 23.5 [place]  FA sat = 79% on room air (checked 3 times). Sat with finger probe on air 94%  PA sat = 41%, 43%  MW 04/18/12 880 feet  ROV 06/24/13 -- f/u for scleroderma, Raynaud's, non-Hodgkins lymphoma (treated '10), HTN, PAH on IV therapy. 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 Aspirating).  She states that she has felt much better with good functional capacity, less dyspnea, no CP since she started prednisone. No overt aspiration sx although she is high risk. [facility]   ROV 07/24/13 -- scleroderma, Raynaud's, non-Hodgkins lymphoma (treated '10), HTN, PAH on IV therapy. Last time we initiated a pred taper, currently on 254m She has noticed a bit of lightheadedness. She saw Dr FoGilles Chiquitoarly July - her O2 was decreased to 4L/min w exertion. Her former maintenance pred dose was 45m1md.   ROV 08/27/13 -- scleroderma, Raynaud's, non-Hodgkins lymphoma (treated '10), HTN, PAH on IV therapy. We have been tapering pred, currently at 15m80md. She has been interested in getting off Veletri. We have planned to repeat R heart cath when we get to stable pred dosing. Breathing is stable. Continues to have occasional Reynaud's, not really increased freq.   ROV 10/08/13 -- scleroderma, Raynaud's, non-Hodgkins lymphoma (treated '10), HTN, PAH on IV therapy. We have been tapering pred, last time decreased to 45mg.29me appears to have tolerated this. No new issues with breathing, CP. Edema is stable. No syncope or pre-syncope.   ROV 01/06/14 -- scleroderma, Raynaud's, non-Hodgkins lymphoma (treated '10), HTN, PAH previously on IV therapy >> [facility]. Current dose is 6 puffs qid. Also on bosentan and sildenifil. She has also been treated for a MRSA R leg ulcer with linezolid, planned for 10 days. She had to stop celexa when the linezolid caused QTc prolongation. She is on Pred 45mg, 61mble scleroderma dose. She saw Dr BeekmaAmil Amenlanning to follow with him.   Breathing is doing well. Doesn't feel that she lost much ground since she changed from IV therapy. No real edema. No syncope or presyncope. No HA's or visual changes.   ROV 01/04/15 -- follow up visit for scleroderma, secondary PAH. We had changed her Veletri to Tyvaso. She has since been taken off Tyvaso. She hasn't seemed to lose any ground off this. Remains on bosentan and tracleer. Also on chronic  prednisone 27m. She wonders about starting cellcept and getting off the prednisone. She also wonders about a substitute for bosentan.   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 She has been on bosentan and sildenafil; [facility] she doesn;t notice any real change. We dropped her pred from 5 to 286mand she has noted some severe fatigue 2h before its time to take the next dose. We did the decrease because she has a poorly healing wound.  She does  not desaturate.        ROV 11/01/15 -- Follow-up visit for history of autoimmune disease and associated secondary pulmonary hypertension. She is on macetentan, slidenifil. She is on prednisone 17m71mShe was admited recently for symptomatic anemia, required transfusion. EGD and CSY were reassuring, received PRBC and staretd on Fe. She feels better, stronger. TTE per CHF clinic plans.   ROV 05/02/16 -- patient with a history of scleroderma on low-dose prednisone and secondary primary hypertension. Also with a history of non-Hodgkin's lymphoma that has been in remission since 2010. She had an echocardiogram on 05/01/16 and have reviewed the results. This showed a stable left ventricular ejection fraction, mild flattening of the intermittent radicular septum and a moderately dilated RV with decreased systolic function. Her PA systolic pressure was estimated at 75 mmHg. Her macitentan was continued sildenafil was changed to Adcirca 35m43mily (not yet available from the pharmacy).   She has been overall stable, although she has been dealing with the stress of her husband being sick. She tells me that she has increased her home O2 to 3L/min, is on 2L/min pulsed on POC. She remains on pred 2.17mg,8mcreased 6 months ago by Dr BeekmAmil Amene has been dealing with symptomatic anemia without evidence GIB  ROV 08/21/16 -- Patient follows up today for her history of secondary pulmonary hypertension in the setting of scleroderma. As of our last visit she was on macetentan and was about to change from sildenafil to [employer] made the switch but did not tolerate so she is back to sildenifil 35mg 44m She's also been maintained on prednisone, current dose is 17mg da50m. She is followed by Dr EdwardsOletta Lamasand Dr Early for her vascular disease. She is to have angiography next month to evaluate LE's. Last TTE was May 2017.   No results for input(s): HGB, HCT, WBC, PLT in the last 168 hours.    Review of Systems  Constitutional:  Negative for appetite change, fever and unexpected weight change.  HENT: Negative for congestion, dental problem, ear pain, nosebleeds, postnasal drip, rhinorrhea, sinus pressure, sneezing, sore throat and trouble swallowing.   Eyes: Negative for redness and itching.  Respiratory: Positive for shortness of breath. Negative for cough, chest tightness and wheezing.   Cardiovascular: Negative for palpitations and leg swelling.  Gastrointestinal: Negative for nausea and vomiting.  Genitourinary: Negative for dysuria.  Musculoskeletal: Negative for joint swelling.  Skin: Negative for rash.  Neurological: Negative for headaches.  Hematological: Does not bruise/bleed easily.  Psychiatric/Behavioral: Negative for dysphoric mood. The patient is not nervous/anxious.        Objective:   Physical Exam Vitals:   08/21/16 1159  BP: 112/64  BP Location: Left Arm  Cuff Size: Normal  Pulse: 64  SpO2: 97%  Weight: 138 lb (62.6 kg)  Height: _0  (1.702 m)   Gen: Pleasant, thin, in no distress,  normal affect on pulsed nasal cannula  O2  ENT: No lesions,  mouth clear,  oropharynx clear, no postnasal drip  Neck: No JVD, no TMG, no carotid bruits  Lungs: No use of accessory muscles, few bibasilar insp crackles  Cardiovascular: RRR, heart sounds normal, no murmur or gallops, 1+ peripheral edema  Musculoskeletal: No deformities, no cyanosis or clubbing  Neuro: alert, non focal  Skin: significant changes from scleroderma on arms, hands, fingers; right lower extremity wound is wrapped and dressed      Assessment & Plan:  Pulmonary hypertension associated with systemic disorder ([place]) Continue your oxygen at 2L/min pulsed.  Continue Oprumit and revatio as you are you are taking them   UGI bleed   Follow with Dr Oletta Lamas regarding timing and appropriateness of iron infusions.    Scleroderma, circumscribed or localized Continue your prednisone at 47m daily until otherwise directed by Dr  BAmil Amen    RBaltazar Apo MD, PhD 08/21/2016, 12:16 PM [facility] [phone] if no answer [phone]

## 2016-08-23 DIAGNOSIS — L97811 Non-pressure chronic ulcer of other part of right lower leg limited to breakdown of skin: Secondary | ICD-10-CM | POA: Diagnosis not present

## 2016-09-06 ENCOUNTER — Encounter (HOSPITAL_BASED_OUTPATIENT_CLINIC_OR_DEPARTMENT_OTHER): Payer: BLUE CROSS/BLUE SHIELD | Attending: Internal Medicine

## 2016-09-06 DIAGNOSIS — I272 Other secondary pulmonary hypertension: Secondary | ICD-10-CM | POA: Diagnosis not present

## 2016-09-06 DIAGNOSIS — I83222 Varicose veins of left lower extremity with both ulcer of calf and inflammation: Secondary | ICD-10-CM | POA: Diagnosis not present

## 2016-09-06 DIAGNOSIS — M349 Systemic sclerosis, unspecified: Secondary | ICD-10-CM | POA: Insufficient documentation

## 2016-09-06 DIAGNOSIS — L97511 Non-pressure chronic ulcer of other part of right foot limited to breakdown of skin: Secondary | ICD-10-CM | POA: Insufficient documentation

## 2016-09-06 DIAGNOSIS — L97311 Non-pressure chronic ulcer of right ankle limited to breakdown of skin: Secondary | ICD-10-CM | POA: Insufficient documentation

## 2016-09-06 DIAGNOSIS — L97811 Non-pressure chronic ulcer of other part of right lower leg limited to breakdown of skin: Secondary | ICD-10-CM | POA: Diagnosis not present

## 2016-09-07 ENCOUNTER — Encounter (HOSPITAL_COMMUNITY)
Admission: RE | Disposition: A | Discharge status: Home / Self Care | Payer: Self-pay | Source: Ambulatory Visit | Attending: Vascular Surgery

## 2016-09-07 ENCOUNTER — Ambulatory Visit (HOSPITAL_COMMUNITY)
Admission: RE | Admit: 2016-09-07 | Discharge: 2016-09-07 | Disposition: A | Discharge status: Home / Self Care | Payer: BLUE CROSS/BLUE SHIELD | Source: Ambulatory Visit | Attending: Vascular Surgery | Admitting: Vascular Surgery

## 2016-09-07 ENCOUNTER — Other Ambulatory Visit: Payer: Self-pay | Admitting: *Deleted

## 2016-09-07 DIAGNOSIS — I70232 Atherosclerosis of native arteries of right leg with ulceration of calf: Secondary | ICD-10-CM | POA: Insufficient documentation

## 2016-09-07 DIAGNOSIS — Z85820 Personal history of malignant melanoma of skin: Secondary | ICD-10-CM | POA: Insufficient documentation

## 2016-09-07 DIAGNOSIS — Z95820 Peripheral vascular angioplasty status with implants and grafts: Secondary | ICD-10-CM | POA: Diagnosis not present

## 2016-09-07 DIAGNOSIS — L97219 Non-pressure chronic ulcer of right calf with unspecified severity: Secondary | ICD-10-CM | POA: Diagnosis not present

## 2016-09-07 DIAGNOSIS — I70242 Atherosclerosis of native arteries of left leg with ulceration of calf: Secondary | ICD-10-CM | POA: Diagnosis not present

## 2016-09-07 DIAGNOSIS — Z8572 Personal history of non-Hodgkin lymphomas: Secondary | ICD-10-CM | POA: Insufficient documentation

## 2016-09-07 DIAGNOSIS — I272 Other secondary pulmonary hypertension: Secondary | ICD-10-CM | POA: Diagnosis not present

## 2016-09-07 DIAGNOSIS — Z7902 Long term (current) use of antithrombotics/antiplatelets: Secondary | ICD-10-CM | POA: Insufficient documentation

## 2016-09-07 DIAGNOSIS — I10 Essential (primary) hypertension: Secondary | ICD-10-CM | POA: Diagnosis not present

## 2016-09-07 DIAGNOSIS — Z7952 Long term (current) use of systemic steroids: Secondary | ICD-10-CM | POA: Insufficient documentation

## 2016-09-07 DIAGNOSIS — M3489 Other systemic sclerosis: Secondary | ICD-10-CM | POA: Diagnosis not present

## 2016-09-07 DIAGNOSIS — Z880 Allergy status to penicillin: Secondary | ICD-10-CM | POA: Insufficient documentation

## 2016-09-07 DIAGNOSIS — I87311 Chronic venous hypertension (idiopathic) with ulcer of right lower extremity: Secondary | ICD-10-CM | POA: Diagnosis not present

## 2016-09-07 DIAGNOSIS — I739 Peripheral vascular disease, unspecified: Secondary | ICD-10-CM | POA: Diagnosis present

## 2016-09-07 DIAGNOSIS — D649 Anemia, unspecified: Secondary | ICD-10-CM | POA: Diagnosis not present

## 2016-09-07 DIAGNOSIS — Z882 Allergy status to sulfonamides status: Secondary | ICD-10-CM | POA: Diagnosis not present

## 2016-09-07 HISTORY — PX: PERIPHERAL VASCULAR CATHETERIZATION: SHX172C

## 2016-09-07 LAB — POCT I-STAT, CHEM 8
BUN: 43 mg/dL — ABNORMAL HIGH (ref 6–20)
Calcium, Ion: 1.12 mmol/L — ABNORMAL LOW (ref 1.15–1.40)
Chloride: 97 mmol/L — ABNORMAL LOW (ref 101–111)
Creatinine, Ser: 1.4 mg/dL — ABNORMAL HIGH (ref 0.44–1.00)
Glucose, Bld: 90 mg/dL (ref 65–99)
HCT: 38 % (ref 36.0–46.0)
Hemoglobin: 12.9 g/dL (ref 12.0–15.0)
Potassium: 3.6 mmol/L (ref 3.5–5.1)
Sodium: 140 mmol/L (ref 135–145)
TCO2: 30 mmol/L (ref 0–100)

## 2016-09-07 SURGERY — ABDOMINAL AORTOGRAM W/LOWER EXTREMITY

## 2016-09-07 MED ORDER — LIDOCAINE HCL (PF) 1 % IJ SOLN
INTRAMUSCULAR | Status: DC | PRN
  Administered 2016-09-07: 5 mL

## 2016-09-07 MED ORDER — LIDOCAINE HCL (PF) 1 % IJ SOLN
INTRAMUSCULAR | Status: AC
  Filled 2016-09-07: qty 30

## 2016-09-07 MED ORDER — ACETAMINOPHEN 325 MG PO TABS
325.0000 mg | ORAL_TABLET | ORAL | Status: DC | PRN
  Filled 2016-09-07: qty 2

## 2016-09-07 MED ORDER — IODIXANOL 320 MG/ML IV SOLN
INTRAVENOUS | Status: DC | PRN
  Administered 2016-09-07: 142 mL via INTRA_ARTERIAL

## 2016-09-07 MED ORDER — ACETAMINOPHEN 325 MG PO TABS
ORAL_TABLET | ORAL | Status: AC
  Filled 2016-09-07: qty 2

## 2016-09-07 MED ORDER — FENTANYL CITRATE (PF) 100 MCG/2ML IJ SOLN
INTRAMUSCULAR | Status: AC
  Filled 2016-09-07: qty 2

## 2016-09-07 MED ORDER — HEPARIN (PORCINE) IN NACL 2-0.9 UNIT/ML-% IJ SOLN
INTRAMUSCULAR | Status: DC | PRN
  Administered 2016-09-07: 1000 mL

## 2016-09-07 MED ORDER — ACETAMINOPHEN 325 MG RE SUPP
325.0000 mg | RECTAL | Status: DC | PRN
  Filled 2016-09-07: qty 2

## 2016-09-07 MED ORDER — FENTANYL CITRATE (PF) 100 MCG/2ML IJ SOLN
INTRAMUSCULAR | Status: DC | PRN
  Administered 2016-09-07: 25 ug via INTRAVENOUS

## 2016-09-07 MED ORDER — METOPROLOL TARTRATE 5 MG/5ML IV SOLN: 2.0000 mg | INTRAVENOUS | Status: DC | PRN

## 2016-09-07 MED ORDER — SODIUM CHLORIDE 0.45 % IV SOLN: INTRAVENOUS | Status: DC

## 2016-09-07 MED ORDER — SODIUM CHLORIDE 0.9 % IV SOLN
INTRAVENOUS | Status: DC
  Administered 2016-09-07: 09:00:00 via INTRAVENOUS

## 2016-09-07 MED ORDER — ONDANSETRON HCL 4 MG/2ML IJ SOLN: 4.0000 mg | Freq: Four times a day (QID) | INTRAMUSCULAR | Status: DC | PRN

## 2016-09-07 MED ORDER — MIDAZOLAM HCL 2 MG/2ML IJ SOLN
INTRAMUSCULAR | Status: DC | PRN
  Administered 2016-09-07: 1 mg via INTRAVENOUS

## 2016-09-07 MED ORDER — HEPARIN (PORCINE) IN NACL 2-0.9 UNIT/ML-% IJ SOLN
INTRAMUSCULAR | Status: AC
  Filled 2016-09-07: qty 1000

## 2016-09-07 MED ORDER — MORPHINE SULFATE (PF) 10 MG/ML IV SOLN: 2.0000 mg | INTRAVENOUS | Status: DC | PRN

## 2016-09-07 MED ORDER — HYDRALAZINE HCL 20 MG/ML IJ SOLN: 5.0000 mg | INTRAMUSCULAR | Status: DC | PRN

## 2016-09-07 MED ORDER — GUAIFENESIN-DM 100-10 MG/5ML PO SYRP
15.0000 mL | ORAL_SOLUTION | ORAL | Status: DC | PRN
  Filled 2016-09-07: qty 15

## 2016-09-07 MED ORDER — LABETALOL HCL 5 MG/ML IV SOLN: 10.0000 mg | INTRAVENOUS | Status: DC | PRN

## 2016-09-07 MED ORDER — MIDAZOLAM HCL 2 MG/2ML IJ SOLN
INTRAMUSCULAR | Status: AC
  Filled 2016-09-07: qty 2

## 2016-09-07 SURGICAL SUPPLY — 7 items
CATH OMNI FLUSH 5F 65CM (CATHETERS) ×2 IMPLANT
KIT PV (KITS) ×2 IMPLANT
SHEATH PINNACLE 5F 10CM (SHEATH) ×2 IMPLANT
SYR MEDRAD MARK V 150ML (SYRINGE) ×2 IMPLANT
TRANSDUCER W/STOPCOCK (MISCELLANEOUS) ×2 IMPLANT
TRAY PV CATH (CUSTOM PROCEDURE TRAY) ×2 IMPLANT
WIRE HITORQ VERSACORE ST 145CM (WIRE) ×2 IMPLANT

## 2016-09-07 NOTE — Discharge Instructions (Signed)
Angiogram, Care After These instructions give you information about caring for yourself after your procedure. Your doctor may also give you more specific instructions. Call your doctor if you have any problems or questions after your procedure.  HOME CARE  Take medicines only as told by your doctor.  Follow your doctor's instructions about:  Care of the area where the tube was inserted.  Bandage (dressing) changes and removal.  You may shower 24-48 hours after the procedure or as told by your doctor.  Do not take baths, swim, or use a hot tub until your doctor approves.  Every day, check the area where the tube was inserted. Watch for:  Redness, swelling, or pain.  Fluid, blood, or pus.  Do not apply powder or lotion to the site.  Do not lift anything that is heavier than 10 lb (4.5 kg) for 5 days or as told by your doctor.  Ask your doctor when you can:  Return to work or school.  Do physical activities or play sports.  Have sex.  Do not drive or operate heavy machinery for 24 hours or as told by your doctor.  Have someone with you for the first 24 hours after the procedure.  Keep all follow-up visits as told by your doctor. This is important. GET HELP IF:  You have a fever.   You have chills.   You have more bleeding from the area where the tube was inserted. Hold pressure on the area.  You have redness, swelling, or pain in the area where the tube was inserted.  You have fluid or pus coming from the area. GET HELP RIGHT AWAY IF:   You have a lot of pain in the area where the tube was inserted.  The area where the tube was inserted is bleeding, and the bleeding does not stop after 30 minutes of holding steady pressure on the area.  The area near or just beyond the insertion site becomes pale, cool, tingly, or numb.   This information is not intended to replace advice given to you by your health care provider. Make sure you discuss any questions you have  with your health care provider.   Document Released: 03/15/2009 Document Revised: 01/07/2015 Document Reviewed: 05/20/2013 Elsevier Interactive Patient Education [employer].

## 2016-09-07 NOTE — Progress Notes (Signed)
Reviewed arteriogram and discussed with the patient and her husband present. Flow-limiting stenosis and heavily calcified common femoral artery proximal to superficial femoral and profunda takeoff.  On physical exam she does have a palpable popliteal pulse distal to this.  Have recommended right femoral endarterectomy for improvement in arterial flow to her right foot. Has had very difficult time healing of her mixed venous and arterial ulcer. She wishes to proceed as soon as possible 

## 2016-09-07 NOTE — Interval H&P Note (Signed)
History and Physical Interval Note:  09/07/2016 9:41 AM  [name] [name]  has presented today for surgery, with the diagnosis of bilateral lower extremity swelling  The various methods of treatment have been discussed with the patient and family. After consideration of risks, benefits and other options for treatment, the patient has consented to  Procedure(s): Abdominal Aortogram w/Lower Extremity (N/A) as a surgical intervention .  The patient's history has been reviewed, patient examined, no change in status, stable for surgery.  I have reviewed the patient's chart and labs.  Questions were answered to the patient's satisfaction.     Ruta Hinds

## 2016-09-07 NOTE — Op Note (Signed)
Procedure: Abdominal aortogram with bilateral lower extremity runoff  Preoperative diagnosis: Nonhealing wound right leg. Postoperative diagnosis: Same  Anesthesia: Local with IV sedation  Operative findings: #1 subtotal occlusion right common femoral artery with heavily calcified plaque                                #2 patent left common femoral endarterectomy                                 #3 patent left superficial femoral artery stent                                 #4  3 vessel runoff left foot                                 #5 diffuse calcific atherosclerosis right superficial femoral artery with two-vessel runoff to the right foot posterior tibial artery is occluded in the mid leg but reconstitutes at the ankle  Operative details: After informed consent, the patient was taken to the [place] lab. The patient was in supine position the Angio table. Both groins were prepped and draped in the usual sterile fashion. Local anesthesia was infiltrated over the left common femoral artery. Ultrasound was used to identify the left common femoral artery and femoral bifurcation. Next an introducer needle was used to cannulate the left common femoral artery without difficulty. An 035 versacore wire was inserted up in the abdominal aorta under fluoroscopic guidance. A 5 French sheath was then placed over the guidewire and the left common femoral artery. This was thoroughly flushed with heparinized saline. A 5 French Omni Flush catheter was advanced over the guidewire into the abdominal aorta and abdominal aortogram obtained in AP projection. The left and right renal arteries are widely patent. There is scattered calcification of the infrarenal abdominal aorta but no focal stenosis. The left and right common external and internal iliac arteries are all widely patent. Next the Omni Flush catheter was pulled down just above the aortic bifurcation and an AP pelvis view obtained. This shows a patent left common femoral  endarterectomy site with patent profunda and SFA origin. On the right side there is a large exophytic calcified plaque obscuring most of the right common femoral artery which is heavily calcified.  At this point we did lower extremity runoff views bilaterally through the Omni Flush catheter.  In the left lower extremity, the left common femoral artery is patent. The left profunda femoris and superficial femoral arteries are patent. In the left mid superficial femoral artery there is a stent which is widely patent. The popliteal artery is widely patent. There is three-vessel runoff to the left foot although there is some scattered areas of narrowing of the tibial origins of about 50%.  In the right lower extremity, the right common femoral artery has a large exophytic calcified plaque as mentioned above. This obscures about 90% of the lumen of the right common femoral artery. The right profunda femoris and superficial femoral arteries are patent. The right superficial femoral artery is diffusely diseased with several segments of exophytic calcified plaque with multiple areas of 50-70% stenosis. The above-knee popliteal artery is patent. The below-knee popliteal artery is patent. The anterior  tibial and peroneal arteries are patent and are the 2 main runoff vessels to the right foot. The posterior tibial artery is occluded in the mid leg. However it does reconstitute at the level of the ankle via collaterals.  At this point the Omni Flush catheter was removed over a guidewire. The 5 French sheath was pulled and hemostasis obtained with direct pressure. The patient tolerated procedure well and there were no complications. The patient was taken to the holding area in stable condition.  Operative management: These films will be reviewed by Dr. [name] for decision-making regarding right femoral endarterectomy plus minus right femoral popliteal bypass graft.  Ruta Hinds, MD [facility]  [place] Office: [phone] Pager: [phone]

## 2016-09-07 NOTE — H&P (View-Only) (Signed)
Patient name: [name] [name] MRN: [id number] DOB: 06/27/1951 Sex: female  REASON FOR VISIT: Follow-up of extensive right leg venous ulceration.  HPI: [name] [name] is a 65 y.o. female seen today for follow-up of her venous ulceration. She is well-known to me from prior ablation of her great saphenous vein. She had severe venous hypertension with severe venous ulceration. This has progressed despite maximal medical therapy with the wound center. She has multiple very large deep ulcers over calf extending down onto her ankle. She does have known arterial insufficiency with a prior history of bilateral lower extremity stenting in [facility] several years ago. She is on chronic prednisone therapy related to her scleroderma.  Past Medical History:  Diagnosis Date  . Anemia   . DOE (dyspnea on exertion)   . Epistaxis   . History of chicken pox   . Hypertension   . Melanoma ([place]) 1999  . 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 Non Hodgkin's lymphoma ([place])    Treated with chemo 2010, felt to be cured.  Marland Kitchen PAH (pulmonary artery hypertension) ([place])   . Peripheral arterial occlusive disease ([place])   . Pulmonary hypertension ([place])   . 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 Lupus Father   . Lymphoma Father   . Hyperlipidemia Mother   . Cancer Daughter     sarcoma  . Colon cancer      SOCIAL HISTORY: Social History  Substance Use Topics  . Smoking status: Never Smoker  . Smokeless tobacco: Never Used  . Alcohol use 0.0 oz/week     Comment: Occasional    Allergies  Allergen Reactions  . Sulfa Antibiotics     Severe rash with significant peeling of face. No airway involvement per patient  . Levaquin [Levofloxacin In D5w] Other (See Comments)    FLU LIKE SYMPTOM  . Penicillins Rash    Current Outpatient Prescriptions  Medication Sig Dispense Refill  . acidophilus (RISAQUAD) CAPS Take 1 capsule by mouth every morning.     Marland Kitchen  atorvastatin (LIPITOR) 10 MG tablet Take 1 tablet (10 mg total) by mouth daily. 30 tablet 1  . BIOTIN PO Take 1 tablet by mouth daily.    . cholecalciferol (VITAMIN D) 1000 UNITS tablet Take 2,000 Units by mouth every evening.     . citalopram (CELEXA) 20 MG tablet Take 40 mg by mouth daily.     . clopidogrel (PLAVIX) 75 MG tablet take 1 tablet by mouth once daily 30 tablet 5  . fish oil-omega-3 fatty acids 1000 MG capsule Take 1 g by mouth 2 (two) times daily.     Marland Kitchen gabapentin (NEURONTIN) 300 MG capsule take 1 capsule by mouth every morning and 2 every evening (Patient taking differently: Take 300 mg by mouth 2 (two) times daily. take 2 capsules by mouth every morning and 2 every evening) 180 capsule 1  . iron polysaccharides (POLY-IRON 150) 150 MG capsule Take 1 capsule (150 mg total) by mouth 2 (two) times daily. 60 capsule 5  . Macitentan (OPSUMIT) 10 MG TABS Take 10 mg by mouth daily. 30 tablet 1  . moxifloxacin (VIGAMOX) 0.5 % ophthalmic solution Place 1 drop into both eyes 3 (three) times daily as needed (allergies).     . multivitamin (THERAGRAN) per tablet Take 1 tablet by mouth daily.     . OXYGEN Inhale 2 L into the lungs continuous.    . pantoprazole (  PROTONIX) 40 MG tablet Take 1 tablet (40 mg total) by mouth 2 (two) times daily. 60 tablet 1  . potassium chloride (K-DUR) 10 MEQ tablet take 1 tablet by mouth once daily 30 tablet 1  . predniSONE (DELTASONE) 5 MG tablet Take 5 mg by mouth daily with breakfast.    . sildenafil (REVATIO) 20 MG tablet Take 2 tablets (40 mg total) by mouth 3 (three) times daily. 180 tablet 6  . spironolactone (ALDACTONE) 25 MG tablet Take 1 tablet (25 mg total) by mouth daily. 30 tablet 1  . torsemide (DEMADEX) 20 MG tablet Take 2 tablets (40 mg total) by mouth 2 (two) times daily. 120 tablet 1  . vitamin B-12 (CYANOCOBALAMIN) 500 MCG tablet Take 500 mcg by mouth every morning.     . gabapentin (NEURONTIN) 300 MG capsule Take 600 mg by mouth 2 (two) times  daily.     No current facility-administered medications for this visit.     REVIEW OF SYSTEMS:  _0  denotes positive finding, _1  denotes negative finding Cardiac  Comments:  Chest pain or chest pressure:    Shortness of breath upon exertion:    Short of breath when lying flat:    Irregular heart rhythm:        Vascular    Pain in calf, thigh, or hip brought on by ambulation:    Pain in feet at night that wakes you up from your sleep:     Blood clot in your veins:    Leg swelling:         Pulmonary    Oxygen at home: x   Productive cough:     Wheezing:         Neurologic    Sudden weakness in arms or legs:     Sudden numbness in arms or legs:     Sudden onset of difficulty speaking or slurred speech:    Temporary loss of vision in one eye:     Problems with dizziness:         Gastrointestinal    Blood in stool:     Vomited blood:         Genitourinary    Burning when urinating:     Blood in urine:        Psychiatric    Major depression:         Hematologic    Bleeding problems:    Problems with blood clotting too easily:        Skin    Rashes or ulcers: x       Constitutional    Fever or chills:      PHYSICAL EXAM: Vitals:   08/14/16 1408  BP: 115/64  Pulse: 60  Resp: 18  Temp: 98.4 F (36.9 C)  TempSrc: Oral  Weight: 137 lb 1.6 oz (62.2 kg)  Height: 5' 7" (1.702 m)    GENERAL: The patient is a well-nourished female, in no acute distress. The vital signs are documented above. CARDIAC: There is a regular rate and rhythm.  VASCULAR: Do not palpate pedal pulses bilaterally. She does have Doppler flow in her feet bilaterally.  PULMONARY: There is good air exchange . On nasal oxygen  MUSCULOSKELETAL:Deformities related to scleroderma  NEUROLOGIC: No focal weakness or paresthesias are detected. SKIN:Marked edema in her right leg with large ulcerations with moderately poor granulation tissue at the base of these.  PSYCHIATRIC: The patient has a normal  affect.  DATA:   Venous duplex-shows successful  ablation of her right great saphenous vein. She does have reflux throughout her deep system on the right.  MEDICAL ISSUES:  Long discussion with patient and her husband present. Explained there is no other options regarding treatment of her venous hypertension. She does have moderate arterial insufficiency. We have discussed in the past potential evaluation of this for possible re-intervention. Since she has had progression of her ulceration despite treatment of her superficial system, I would recommend arteriography for further evaluation to determine if any options are possible. I we'll schedule her for outpatient arteriography for further evaluation   Hisao Doo [facility] [employer] [facility] [phone]

## 2016-09-10 ENCOUNTER — Encounter (HOSPITAL_COMMUNITY): Payer: Self-pay | Admitting: Vascular Surgery

## 2016-09-10 LAB — HM MAMMOGRAPHY

## 2016-09-11 ENCOUNTER — Other Ambulatory Visit (HOSPITAL_COMMUNITY): Payer: Self-pay | Admitting: *Deleted

## 2016-09-11 ENCOUNTER — Encounter (HOSPITAL_COMMUNITY)
Admission: RE | Admit: 2016-09-11 | Discharge: 2016-09-11 | Disposition: A | Discharge status: Home / Self Care | Payer: BLUE CROSS/BLUE SHIELD | Source: Ambulatory Visit | Attending: Vascular Surgery | Admitting: Vascular Surgery

## 2016-09-11 ENCOUNTER — Encounter (HOSPITAL_COMMUNITY): Payer: Self-pay

## 2016-09-11 DIAGNOSIS — Z01818 Encounter for other preprocedural examination: Secondary | ICD-10-CM | POA: Diagnosis not present

## 2016-09-11 HISTORY — DX: Dependence on supplemental oxygen: Z99.81

## 2016-09-11 HISTORY — DX: Non-pressure chronic ulcer of unspecified part of unspecified lower leg with unspecified severity: L97.909

## 2016-09-11 HISTORY — DX: Unspecified osteoarthritis, unspecified site: M19.90

## 2016-09-11 LAB — CBC
HCT: 35 % — ABNORMAL LOW (ref 36.0–46.0)
Hemoglobin: 10.9 g/dL — ABNORMAL LOW (ref 12.0–15.0)
MCH: 31.4 pg (ref 26.0–34.0)
MCHC: 31.1 g/dL (ref 30.0–36.0)
MCV: 100.9 fL — ABNORMAL HIGH (ref 78.0–100.0)
Platelets: 270 10*3/uL (ref 150–400)
RBC: 3.47 MIL/uL — ABNORMAL LOW (ref 3.87–5.11)
RDW: 17.2 % — ABNORMAL HIGH (ref 11.5–15.5)
WBC: 8.1 10*3/uL (ref 4.0–10.5)

## 2016-09-11 LAB — COMPREHENSIVE METABOLIC PANEL
ALT: 24 U/L (ref 14–54)
AST: 32 U/L (ref 15–41)
Albumin: 3.9 g/dL (ref 3.5–5.0)
Alkaline Phosphatase: 96 U/L (ref 38–126)
Anion gap: 11 (ref 5–15)
BUN: 44 mg/dL — ABNORMAL HIGH (ref 6–20)
CO2: 28 mmol/L (ref 22–32)
Calcium: 9.3 mg/dL (ref 8.9–10.3)
Chloride: 97 mmol/L — ABNORMAL LOW (ref 101–111)
Creatinine, Ser: 1.52 mg/dL — ABNORMAL HIGH (ref 0.44–1.00)
GFR calc Af Amer: 40 mL/min — ABNORMAL LOW (ref >60)
GFR calc non Af Amer: 35 mL/min — ABNORMAL LOW (ref >60)
Glucose, Bld: 85 mg/dL (ref 65–99)
Potassium: 4.6 mmol/L (ref 3.5–5.1)
Sodium: 136 mmol/L (ref 135–145)
Total Bilirubin: 0.7 mg/dL (ref 0.3–1.2)
Total Protein: 7.6 g/dL (ref 6.5–8.1)

## 2016-09-11 LAB — URINE MICROSCOPIC-ADD ON: Bacteria, UA: NONE SEEN

## 2016-09-11 LAB — SURGICAL PCR SCREEN
MRSA, PCR: POSITIVE — AB
Staphylococcus aureus: POSITIVE — AB

## 2016-09-11 LAB — URINALYSIS, ROUTINE W REFLEX MICROSCOPIC
Bilirubin Urine: NEGATIVE
Glucose, UA: NEGATIVE mg/dL
Hgb urine dipstick: NEGATIVE
Ketones, ur: NEGATIVE mg/dL
Nitrite: NEGATIVE
Protein, ur: NEGATIVE mg/dL
Specific Gravity, Urine: <1.005 — ABNORMAL LOW (ref 1.005–1.030)
pH: 6 (ref 5.0–8.0)

## 2016-09-11 LAB — PROTIME-INR
INR: 0.9
Prothrombin Time: 12.1 seconds (ref 11.4–15.2)

## 2016-09-11 LAB — APTT: aPTT: 47 seconds — ABNORMAL HIGH (ref 24–36)

## 2016-09-11 NOTE — Pre-Procedure Instructions (Signed)
[name]  09/11/2016      [id number], [place] [address] Phone: [phone] Fax: [phone]  [place], [facility] [address] [place] [address] Phone: [phone] Fax: [place], [place] [address] [place] [address] Phone: [phone] Fax: [phone]    Your procedure is scheduled on 09-14-2016   Friday .  Report to [facility] Admitting at 10:45 A.M.   Call this number if you have problems the morning of surgery:  [phone]   Remember:  Do not eat food or drink liquids after midnight.   Take these medicines the morning of surgery with A SIP OF WATER Atorvastatin(Lipitor),citalopram(Celexa),Gabapentin(Neurontin),Macitentan(Opsumit),Vigamox eye drops if needed,Pantoprazole(Protonix),Prednisone,Sildenafil(Revatio)             STOP ASPIRIN,ANTIINFLAMATORIES (IBUPROFEN,ALEVE,MOTRIN,ADVIL,GOODY'S POWDERS),HERBAL SUPPLEMENTS,FISH OIL,AND VITAMINS 5-7 DAYS PRIOR TO SURGERY        Stop Plavix per Physician  instructions   Do not wear jewelry, make-up or nail polish.  Do not wear lotions, powders, or perfumes, or deoderant.  Do not shave 48 hours prior to surgery.  Men may shave face and neck.  Do not bring valuables to the hospital.  [facility] is not responsible for any belongings or valuables.  Contacts, dentures or bridgework may not be worn into surgery.  Leave your suitcase in the car.  After surgery it may be brought to your room.  For patients admitted to the hospital, discharge time will be determined by your treatment team.  Patients discharged the day of surgery will not be allowed to drive home.     Special instructions:  See attached instructions for CHG shower

## 2016-09-11 NOTE — Progress Notes (Signed)
Pt. Had blood transfusion in July,T&S needs to be done DOS.  Pt. States that her PCR's are always positive.she has mupirocin at home and she is going to start using it today.

## 2016-09-12 NOTE — Progress Notes (Addendum)
Anesthesia chart review: Patient is a 65 year old female scheduled for right femoral endarterectomy on 09/14/2016 by Dr. [name].  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 Admission 04/2016 for UGI bleed with EGD showing esophagitis. Admission 07/29/16-08/01/16 for GI bleed, CKD. Capsule endo ordered (results showed inadequate visualization). Transfused with 2 Units PRBC.  - PCP is Dr. Pricilla Holm. - Pulmonologist is Dr. Lamonte Sakai, last visit 08/21/16. No changes made at that time.  - Cardiologist is Dr. [name] ([facility]), last visit 05/01/16. He switched her Revatio to Adcirca, but this was switched back on 05/30/16 due to lightheadedness. She declined a 6MW that visit due to stress from her husband's hospitalization. - Rheumatologist is Dr. Amil Amen. - GI is Dr. Oletta Lamas.  Meds include Lipitor, Celexa, Plavix, fish oil, Neurontin, poly-iron 150, macitentan (Opsumit), Vigamox ophthalmic, Protonix, KCl, prednisone, sildenafil, Aldactone, torsemide. (Previously intolerant to Veletri and Adcirca, no improvement on Tyvaso.) Okay for patient to continue ASA and Plavix per Dr. [name].  BP (!) 124/48   Pulse 77   Temp 36.8 C   Resp 20   Ht 5' 7.5" (1.715 m)   Wt 142 lb 11.2 oz (64.7 kg)   SpO2 94%   BMI 22.02 kg/m    EKG 07/31/16: SR, probable LAE, probable RVH with secondary repolarization abnormality, non-specific T wave abnormality in inferolateral leads, prolonged QT. Overall EKG appears stable when compared to 05/01/16 tracing.   Echo 05/01/16: LVEF 65-70%. LV wall motion was normal; there were no regional wall motion  abnormalities. Grade 1 diastolic dysfunction. Trivial MR.Mild pulmonic regurgitation. Moderately dilated RV with moderator band prominence. RV systolic function is reduced. PA peak pressure 68 mm Hg (S). Compared to prior study in 07/2015, RVSP reduced from 83 mmHg to 68 mmHg.   Repeat RHC 11/14/12  RA = 14  RV = 84/8/20  PA = 83/29 (50)  PCW = 10  Fick cardiac output/index = 4.5/2.5  Thermo CO/CI = 3.8/2.2  PVR = 8.9 Woods (Fick)  FA sat = 86% on 3L  PA sat = 55%, 57  RHC 04/18/12:  RA = 9  RV = 75/11/14  PA = 71/25 (44)  PCW = 4  Fick cardiac output/index = 3.0/1.7  Thermo CO/CI = 2.7/1.5  PVR = 23.5 Woods  FA sat = 79% on room air (checked 3 times). Sat with finger probe on air 94%  PA sat = 41%, 43%  MW 04/18/12 880 feet  6 MW 04/18/12 880 feet 6 MW 11/10/12 650 feet. Limited by legs. Sats down to 85% on 5L (sats at rest 95%)  6 MW 3/15 915 feet (10 m) 6 MW 2/16 930 feet (283 m)  Abdominal aortogram with BLE runoff 09/07/16: Findings: 1. subtotal occlusion right common femoral artery with heavily calcified plaque 2. patent left common femoral endarterectomy  3. patent left superficial femoral artery stent  4. 3 vessel runoff left foot  5. diffuse calcific atherosclerosis right superficial femoral artery with two-vessel runoff to the right foot posterior tibial artery is occluded in the mid leg but reconstitutes at the ankle  Preoperative labs noted. BUN 44, Cr 1.52, up from 1.40 on 09/07/16 and 0.78 on 08/01/16 (she has had variable Cr results since at  least 10/2015, ranging from 0.78-1.87; Cr > 1.50 on 10/17/15, 11/17/15, 05/06/16, 06/08/16, 07/26/16, 07/29/16). H/H 10.9/35.0. PT/INR 12.1/0.90, PTT 47. Glucose 85. UA showed trace leukocytes, negative nitrites. She needs T&S on the day of surgery. Cr and PTT called to [facility] RN Zigmund Daniel.  Discussed above with anesthesiologist Dr. Jillyn Hidden. Recommend pre-operative cardiology input from Dr. [name] due to severe pulmonary HTN history. [facility] RN Ulis Rias  notified. She will follow-up with cardiology. Chart will be left for follow-up.  George Hugh [facility] Short Stay Center/Anesthesiology Phone [phone] 09/12/2016 5:04 PM  Addendum: Dr. [name] aware of surgery plans. He wrote, "She is at high-risk for perioperative CV complications with her pulmonary HTN particularly if general anesthesia is used.   However, this risk is not prohibitive and if she is at risk for losing a limb would proceed with surgery with careful hemodynamic monitoring."   Since her Cr has been quite variable and PTT was elevated with PAT labs, I will add repeat a BMET and PTT when they draw a specimen for her T&S tomorrow. Review of labs and evaluation of patient tomorrow by her anesthesiologist. Definitive plan at that time.  George Hugh [facility] Short Stay Center/Anesthesiology Phone [phone] 09/13/2016 2:57 PM

## 2016-09-13 ENCOUNTER — Telehealth (HOSPITAL_COMMUNITY): Payer: Self-pay | Admitting: *Deleted

## 2016-09-13 MED ORDER — VANCOMYCIN HCL IN DEXTROSE 1-5 GM/200ML-% IV SOLN
1000.0000 mg | INTRAVENOUS | Status: AC
  Administered 2016-09-14: 1000 mg via INTRAVENOUS
  Filled 2016-09-13 (×2): qty 200

## 2016-09-13 NOTE — Telephone Encounter (Signed)
Called Zigmund Daniel back with results, spoke w/Carol, she will send message to Dr [name]

## 2016-09-13 NOTE — Telephone Encounter (Signed)
She is at high-risk for perioperative CV complications with her pulmonary HTN particularly if general anesthesia is used.   However, this risk is not prohibitive and if she is at risk for losing a limb would proceed with surgery with careful hemodynamic monitoring.

## 2016-09-13 NOTE — Telephone Encounter (Signed)
Pt is scheduled for femoral endarterectomy with Dr [name] tomorrow and anaesthesia  needs clearance from Dr [name], they have told pt to start holding Plavix yesterday.  Will send to Dr [name] for review

## 2016-09-14 ENCOUNTER — Inpatient Hospital Stay (HOSPITAL_COMMUNITY): Payer: BLUE CROSS/BLUE SHIELD | Admitting: Vascular Surgery

## 2016-09-14 ENCOUNTER — Inpatient Hospital Stay (HOSPITAL_COMMUNITY)
Admission: RE | Admit: 2016-09-14 | Discharge: 2016-09-15 | DRG: 253 | Disposition: A | Discharge status: Home / Self Care | Payer: BLUE CROSS/BLUE SHIELD | Source: Ambulatory Visit | Attending: Vascular Surgery | Admitting: Vascular Surgery

## 2016-09-14 ENCOUNTER — Encounter (HOSPITAL_COMMUNITY): Payer: Self-pay | Admitting: Surgery

## 2016-09-14 ENCOUNTER — Encounter (HOSPITAL_COMMUNITY)
Admission: RE | Disposition: A | Discharge status: Home / Self Care | Payer: Self-pay | Source: Ambulatory Visit | Attending: Vascular Surgery

## 2016-09-14 DIAGNOSIS — Z23 Encounter for immunization: Secondary | ICD-10-CM

## 2016-09-14 DIAGNOSIS — I129 Hypertensive chronic kidney disease with stage 1 through stage 4 chronic kidney disease, or unspecified chronic kidney disease: Secondary | ICD-10-CM | POA: Diagnosis not present

## 2016-09-14 DIAGNOSIS — L97219 Non-pressure chronic ulcer of right calf with unspecified severity: Secondary | ICD-10-CM | POA: Diagnosis present

## 2016-09-14 DIAGNOSIS — Z880 Allergy status to penicillin: Secondary | ICD-10-CM | POA: Diagnosis not present

## 2016-09-14 DIAGNOSIS — Z9981 Dependence on supplemental oxygen: Secondary | ICD-10-CM

## 2016-09-14 DIAGNOSIS — Z882 Allergy status to sulfonamides status: Secondary | ICD-10-CM | POA: Diagnosis not present

## 2016-09-14 DIAGNOSIS — M349 Systemic sclerosis, unspecified: Secondary | ICD-10-CM | POA: Diagnosis present

## 2016-09-14 DIAGNOSIS — Z7902 Long term (current) use of antithrombotics/antiplatelets: Secondary | ICD-10-CM

## 2016-09-14 DIAGNOSIS — I70239 Atherosclerosis of native arteries of right leg with ulceration of unspecified site: Principal | ICD-10-CM | POA: Diagnosis present

## 2016-09-14 DIAGNOSIS — Z79899 Other long term (current) drug therapy: Secondary | ICD-10-CM | POA: Diagnosis not present

## 2016-09-14 DIAGNOSIS — Z7952 Long term (current) use of systemic steroids: Secondary | ICD-10-CM | POA: Diagnosis not present

## 2016-09-14 DIAGNOSIS — I739 Peripheral vascular disease, unspecified: Secondary | ICD-10-CM | POA: Diagnosis not present

## 2016-09-14 DIAGNOSIS — D62 Acute posthemorrhagic anemia: Secondary | ICD-10-CM | POA: Diagnosis not present

## 2016-09-14 DIAGNOSIS — I87311 Chronic venous hypertension (idiopathic) with ulcer of right lower extremity: Secondary | ICD-10-CM | POA: Diagnosis not present

## 2016-09-14 DIAGNOSIS — I70242 Atherosclerosis of native arteries of left leg with ulceration of calf: Secondary | ICD-10-CM | POA: Diagnosis not present

## 2016-09-14 DIAGNOSIS — Z881 Allergy status to other antibiotic agents status: Secondary | ICD-10-CM | POA: Diagnosis not present

## 2016-09-14 DIAGNOSIS — I70261 Atherosclerosis of native arteries of extremities with gangrene, right leg: Secondary | ICD-10-CM | POA: Diagnosis present

## 2016-09-14 DIAGNOSIS — L97319 Non-pressure chronic ulcer of right ankle with unspecified severity: Secondary | ICD-10-CM | POA: Diagnosis present

## 2016-09-14 DIAGNOSIS — I272 Other secondary pulmonary hypertension: Secondary | ICD-10-CM | POA: Diagnosis present

## 2016-09-14 DIAGNOSIS — N183 Chronic kidney disease, stage 3 (moderate): Secondary | ICD-10-CM | POA: Diagnosis present

## 2016-09-14 HISTORY — PX: PATCH ANGIOPLASTY: SHX6230

## 2016-09-14 HISTORY — PX: ENDARTERECTOMY FEMORAL: SHX5804

## 2016-09-14 LAB — TYPE AND SCREEN
ABO/RH(D): B POS
Antibody Screen: NEGATIVE

## 2016-09-14 LAB — APTT: aPTT: 44 seconds — ABNORMAL HIGH (ref 24–36)

## 2016-09-14 SURGERY — ENDARTERECTOMY, FEMORAL
Anesthesia: General | Site: Groin | Laterality: Right

## 2016-09-14 MED ORDER — LIDOCAINE 2% (20 MG/ML) 5 ML SYRINGE
INTRAMUSCULAR | Status: AC
  Filled 2016-09-14: qty 10

## 2016-09-14 MED ORDER — CYANOCOBALAMIN 500 MCG PO TABS
500.0000 ug | ORAL_TABLET | Freq: Every morning | ORAL | Status: DC
  Administered 2016-09-15: 500 ug via ORAL
  Filled 2016-09-14: qty 1

## 2016-09-14 MED ORDER — CHLORHEXIDINE GLUCONATE CLOTH 2 % EX PADS: 6.0000 | MEDICATED_PAD | Freq: Once | CUTANEOUS | Status: DC

## 2016-09-14 MED ORDER — MIDAZOLAM HCL 5 MG/5ML IJ SOLN
INTRAMUSCULAR | Status: DC | PRN
  Administered 2016-09-14: 1 mg via INTRAVENOUS

## 2016-09-14 MED ORDER — SILDENAFIL CITRATE 20 MG PO TABS
40.0000 mg | ORAL_TABLET | Freq: Three times a day (TID) | ORAL | Status: DC
  Administered 2016-09-14 – 2016-09-15 (×2): 40 mg via ORAL
  Filled 2016-09-14 (×2): qty 2

## 2016-09-14 MED ORDER — FENTANYL CITRATE (PF) 100 MCG/2ML IJ SOLN
INTRAMUSCULAR | Status: AC
  Filled 2016-09-14: qty 2

## 2016-09-14 MED ORDER — ENOXAPARIN SODIUM 40 MG/0.4ML ~~LOC~~ SOLN: 40.0000 mg | SUBCUTANEOUS | Status: DC

## 2016-09-14 MED ORDER — TORSEMIDE 20 MG PO TABS
40.0000 mg | ORAL_TABLET | Freq: Two times a day (BID) | ORAL | Status: DC
  Administered 2016-09-15: 40 mg via ORAL
  Filled 2016-09-14: qty 2

## 2016-09-14 MED ORDER — OXYCODONE-ACETAMINOPHEN 5-325 MG PO TABS
1.0000 | ORAL_TABLET | ORAL | Status: DC | PRN
  Administered 2016-09-14 (×2): 2 via ORAL
  Filled 2016-09-14 (×2): qty 2

## 2016-09-14 MED ORDER — ORAL CARE MOUTH RINSE: 15.0000 mL | Freq: Two times a day (BID) | OROMUCOSAL | Status: DC

## 2016-09-14 MED ORDER — MIDAZOLAM HCL 2 MG/2ML IJ SOLN: 0.5000 mg | Freq: Once | INTRAMUSCULAR | Status: DC | PRN

## 2016-09-14 MED ORDER — PANTOPRAZOLE SODIUM 40 MG PO TBEC
40.0000 mg | DELAYED_RELEASE_TABLET | Freq: Two times a day (BID) | ORAL | Status: DC
  Administered 2016-09-14 – 2016-09-15 (×2): 40 mg via ORAL
  Filled 2016-09-14 (×2): qty 1

## 2016-09-14 MED ORDER — MAGNESIUM SULFATE 2 GM/50ML IV SOLN: 2.0000 g | Freq: Every day | INTRAVENOUS | Status: DC | PRN

## 2016-09-14 MED ORDER — SUGAMMADEX SODIUM 200 MG/2ML IV SOLN
INTRAVENOUS | Status: AC
  Filled 2016-09-14: qty 2

## 2016-09-14 MED ORDER — ONDANSETRON HCL 4 MG/2ML IJ SOLN: 4.0000 mg | Freq: Four times a day (QID) | INTRAMUSCULAR | Status: DC | PRN

## 2016-09-14 MED ORDER — GUAIFENESIN-DM 100-10 MG/5ML PO SYRP: 15.0000 mL | ORAL_SOLUTION | ORAL | Status: DC | PRN

## 2016-09-14 MED ORDER — PROPOFOL 10 MG/ML IV BOLUS
INTRAVENOUS | Status: DC | PRN
  Administered 2016-09-14: 100 mg via INTRAVENOUS

## 2016-09-14 MED ORDER — EPHEDRINE 5 MG/ML INJ
INTRAVENOUS | Status: AC
  Filled 2016-09-14: qty 10

## 2016-09-14 MED ORDER — VITAMIN D 1000 UNITS PO TABS
2000.0000 [IU] | ORAL_TABLET | Freq: Every evening | ORAL | Status: DC
  Administered 2016-09-14: 2000 [IU] via ORAL
  Filled 2016-09-14: qty 2

## 2016-09-14 MED ORDER — MORPHINE SULFATE (PF) 2 MG/ML IV SOLN: 1.0000 mg | INTRAVENOUS | Status: DC | PRN

## 2016-09-14 MED ORDER — ALUM & MAG HYDROXIDE-SIMETH 200-200-20 MG/5ML PO SUSP: 15.0000 mL | ORAL | Status: DC | PRN

## 2016-09-14 MED ORDER — PHENOL 1.4 % MT LIQD: 1.0000 | OROMUCOSAL | Status: DC | PRN

## 2016-09-14 MED ORDER — POTASSIUM CHLORIDE ER 10 MEQ PO TBCR
10.0000 meq | EXTENDED_RELEASE_TABLET | Freq: Every day | ORAL | Status: DC
  Administered 2016-09-14 – 2016-09-15 (×2): 10 meq via ORAL
  Filled 2016-09-14 (×3): qty 1

## 2016-09-14 MED ORDER — SUGAMMADEX SODIUM 200 MG/2ML IV SOLN
INTRAVENOUS | Status: DC | PRN
  Administered 2016-09-14: 150 mg via INTRAVENOUS

## 2016-09-14 MED ORDER — SODIUM CHLORIDE 0.9 % IV SOLN
INTRAVENOUS | Status: DC | PRN
  Administered 2016-09-14: 14:00:00

## 2016-09-14 MED ORDER — VANCOMYCIN HCL IN DEXTROSE 1-5 GM/200ML-% IV SOLN
1000.0000 mg | Freq: Two times a day (BID) | INTRAVENOUS | Status: DC
  Administered 2016-09-14: 1000 mg via INTRAVENOUS
  Filled 2016-09-14 (×2): qty 200

## 2016-09-14 MED ORDER — LIDOCAINE HCL 4 % EX SOLN
CUTANEOUS | Status: DC | PRN
  Administered 2016-09-14: 3 mL via TOPICAL

## 2016-09-14 MED ORDER — GATIFLOXACIN 0.5 % OP SOLN
1.0000 [drp] | Freq: Four times a day (QID) | OPHTHALMIC | Status: DC
  Administered 2016-09-14 – 2016-09-15 (×2): 1 [drp] via OPHTHALMIC
  Filled 2016-09-14: qty 2.5

## 2016-09-14 MED ORDER — FENTANYL CITRATE (PF) 100 MCG/2ML IJ SOLN
INTRAMUSCULAR | Status: DC | PRN
  Administered 2016-09-14: 50 ug via INTRAVENOUS
  Administered 2016-09-14: 100 ug via INTRAVENOUS

## 2016-09-14 MED ORDER — FENTANYL CITRATE (PF) 100 MCG/2ML IJ SOLN
25.0000 ug | INTRAMUSCULAR | Status: DC | PRN
  Administered 2016-09-14 (×3): 50 ug via INTRAVENOUS

## 2016-09-14 MED ORDER — ROCURONIUM BROMIDE 100 MG/10ML IV SOLN
INTRAVENOUS | Status: DC | PRN
  Administered 2016-09-14: 10 mg via INTRAVENOUS
  Administered 2016-09-14: 40 mg via INTRAVENOUS

## 2016-09-14 MED ORDER — MACITENTAN 10 MG PO TABS: 10.0000 mg | ORAL_TABLET | Freq: Every day | ORAL | Status: DC

## 2016-09-14 MED ORDER — HEPARIN SODIUM (PORCINE) 1000 UNIT/ML IJ SOLN
INTRAMUSCULAR | Status: DC | PRN
  Administered 2016-09-14: 7000 [IU] via INTRAVENOUS

## 2016-09-14 MED ORDER — POTASSIUM CHLORIDE CRYS ER 20 MEQ PO TBCR: 20.0000 meq | EXTENDED_RELEASE_TABLET | Freq: Every day | ORAL | Status: DC | PRN

## 2016-09-14 MED ORDER — OXYCODONE-ACETAMINOPHEN 5-325 MG PO TABS
ORAL_TABLET | ORAL | Status: AC
  Filled 2016-09-14: qty 2

## 2016-09-14 MED ORDER — MIDAZOLAM HCL 2 MG/2ML IJ SOLN
INTRAMUSCULAR | Status: AC
  Filled 2016-09-14: qty 2

## 2016-09-14 MED ORDER — METOPROLOL TARTRATE 5 MG/5ML IV SOLN
2.0000 mg | INTRAVENOUS | Status: DC | PRN
  Filled 2016-09-14: qty 5

## 2016-09-14 MED ORDER — ONDANSETRON HCL 4 MG/2ML IJ SOLN
INTRAMUSCULAR | Status: AC
  Filled 2016-09-14: qty 2

## 2016-09-14 MED ORDER — HYDRALAZINE HCL 20 MG/ML IJ SOLN
5.0000 mg | INTRAMUSCULAR | Status: DC | PRN
  Filled 2016-09-14: qty 0.25

## 2016-09-14 MED ORDER — PHENYLEPHRINE HCL 10 MG/ML IJ SOLN
INTRAVENOUS | Status: DC | PRN
  Administered 2016-09-14: 40 ug/min via INTRAVENOUS
  Administered 2016-09-14: 20 ug/min via INTRAVENOUS

## 2016-09-14 MED ORDER — EPHEDRINE SULFATE 50 MG/ML IJ SOLN
INTRAMUSCULAR | Status: DC | PRN
  Administered 2016-09-14 (×4): 5 mg via INTRAVENOUS

## 2016-09-14 MED ORDER — PROTAMINE SULFATE 10 MG/ML IV SOLN
INTRAVENOUS | Status: AC
  Filled 2016-09-14: qty 5

## 2016-09-14 MED ORDER — LABETALOL HCL 5 MG/ML IV SOLN
10.0000 mg | INTRAVENOUS | Status: DC | PRN
  Filled 2016-09-14: qty 4

## 2016-09-14 MED ORDER — ADULT MULTIVITAMIN W/MINERALS CH
1.0000 | ORAL_TABLET | Freq: Every day | ORAL | Status: DC
  Administered 2016-09-15: 1 via ORAL
  Filled 2016-09-14 (×3): qty 1

## 2016-09-14 MED ORDER — ACETAMINOPHEN 325 MG PO TABS
325.0000 mg | ORAL_TABLET | ORAL | Status: DC | PRN
Start: 2016-09-14 — End: 2016-09-15

## 2016-09-14 MED ORDER — CITALOPRAM HYDROBROMIDE 20 MG PO TABS
40.0000 mg | ORAL_TABLET | Freq: Every day | ORAL | Status: DC
  Administered 2016-09-14: 40 mg via ORAL
  Filled 2016-09-14 (×2): qty 2

## 2016-09-14 MED ORDER — CLOPIDOGREL BISULFATE 75 MG PO TABS
75.0000 mg | ORAL_TABLET | Freq: Every day | ORAL | Status: DC
  Administered 2016-09-15: 75 mg via ORAL
  Filled 2016-09-14: qty 1

## 2016-09-14 MED ORDER — LACTATED RINGERS IV SOLN
INTRAVENOUS | Status: DC
  Administered 2016-09-14 (×2): via INTRAVENOUS

## 2016-09-14 MED ORDER — ATORVASTATIN CALCIUM 10 MG PO TABS
10.0000 mg | ORAL_TABLET | Freq: Every day | ORAL | Status: DC
  Administered 2016-09-15: 10 mg via ORAL
  Filled 2016-09-14: qty 1

## 2016-09-14 MED ORDER — SODIUM CHLORIDE 0.9 % IV SOLN
500.0000 mL | Freq: Once | INTRAVENOUS | Status: AC | PRN
  Administered 2016-09-14: 500 mL via INTRAVENOUS

## 2016-09-14 MED ORDER — PREDNISONE 10 MG PO TABS
5.0000 mg | ORAL_TABLET | Freq: Every day | ORAL | Status: DC
  Administered 2016-09-15: 5 mg via ORAL
  Filled 2016-09-14: qty 1

## 2016-09-14 MED ORDER — GABAPENTIN 300 MG PO CAPS
600.0000 mg | ORAL_CAPSULE | Freq: Two times a day (BID) | ORAL | Status: DC
  Administered 2016-09-14 – 2016-09-15 (×2): 600 mg via ORAL
  Filled 2016-09-14 (×2): qty 2

## 2016-09-14 MED ORDER — 0.9 % SODIUM CHLORIDE (POUR BTL) OPTIME
TOPICAL | Status: DC | PRN
  Administered 2016-09-14: 2000 mL

## 2016-09-14 MED ORDER — DOCUSATE SODIUM 100 MG PO CAPS
100.0000 mg | ORAL_CAPSULE | Freq: Every day | ORAL | Status: DC
  Administered 2016-09-15: 100 mg via ORAL
  Filled 2016-09-14: qty 1

## 2016-09-14 MED ORDER — HEPARIN SODIUM (PORCINE) 1000 UNIT/ML IJ SOLN
INTRAMUSCULAR | Status: AC
  Filled 2016-09-14: qty 1

## 2016-09-14 MED ORDER — INFLUENZA VAC SPLIT QUAD 0.5 ML IM SUSY
0.5000 mL | PREFILLED_SYRINGE | INTRAMUSCULAR | Status: AC
  Administered 2016-09-15: 0.5 mL via INTRAMUSCULAR
  Filled 2016-09-14: qty 0.5

## 2016-09-14 MED ORDER — ONDANSETRON HCL 4 MG/2ML IJ SOLN
INTRAMUSCULAR | Status: DC | PRN
  Administered 2016-09-14: 4 mg via INTRAVENOUS

## 2016-09-14 MED ORDER — MEPERIDINE HCL 25 MG/ML IJ SOLN: 6.2500 mg | INTRAMUSCULAR | Status: DC | PRN

## 2016-09-14 MED ORDER — POLYSACCHARIDE IRON COMPLEX 150 MG PO CAPS
150.0000 mg | ORAL_CAPSULE | Freq: Two times a day (BID) | ORAL | Status: DC
  Administered 2016-09-14 – 2016-09-15 (×2): 150 mg via ORAL
  Filled 2016-09-14 (×2): qty 1

## 2016-09-14 MED ORDER — ACETAMINOPHEN 325 MG RE SUPP: 325.0000 mg | RECTAL | Status: DC | PRN

## 2016-09-14 MED ORDER — PROTAMINE SULFATE 10 MG/ML IV SOLN
INTRAVENOUS | Status: DC | PRN
Start: 2016-09-14 — End: 2016-09-14
  Administered 2016-09-14: 50 mg via INTRAVENOUS

## 2016-09-14 MED ORDER — LIDOCAINE HCL (CARDIAC) 20 MG/ML IV SOLN
INTRAVENOUS | Status: DC | PRN
  Administered 2016-09-14: 40 mg via INTRAVENOUS

## 2016-09-14 MED ORDER — SPIRONOLACTONE 25 MG PO TABS
25.0000 mg | ORAL_TABLET | Freq: Every day | ORAL | Status: DC
  Administered 2016-09-14 – 2016-09-15 (×2): 25 mg via ORAL
  Filled 2016-09-14 (×2): qty 1

## 2016-09-14 MED ORDER — SODIUM CHLORIDE 0.9 % IV SOLN: INTRAVENOUS | Status: DC

## 2016-09-14 MED ORDER — SODIUM CHLORIDE 0.9 % IV SOLN
INTRAVENOUS | Status: DC
  Administered 2016-09-14: 20:00:00 via INTRAVENOUS

## 2016-09-14 SURGICAL SUPPLY — 42 items
BENZOIN TINCTURE PRP APPL 2/3 (GAUZE/BANDAGES/DRESSINGS) ×2 IMPLANT
CANISTER SUCTION 2500CC (MISCELLANEOUS) ×2 IMPLANT
CANNULA VESSEL 3MM 2 BLNT TIP (CANNULA) ×4 IMPLANT
CLIP LIGATING EXTRA MED SLVR (CLIP) ×2 IMPLANT
CLIP LIGATING EXTRA SM BLUE (MISCELLANEOUS) ×2 IMPLANT
DRAIN SNY 10X20 3/4 PERF (WOUND CARE) IMPLANT
DRAPE X-RAY CASS 24X20 (DRAPES) IMPLANT
DRSG COVADERM 4X6 (GAUZE/BANDAGES/DRESSINGS) ×2 IMPLANT
ELECT REM PT RETURN 9FT ADLT (ELECTROSURGICAL) ×2
ELECTRODE REM PT RTRN 9FT ADLT (ELECTROSURGICAL) ×1 IMPLANT
EVACUATOR SILICONE 100CC (DRAIN) IMPLANT
GLOVE BIO SURGEON STRL SZ 6.5 (GLOVE) ×2 IMPLANT
GLOVE BIOGEL PI IND STRL 6.5 (GLOVE) ×2 IMPLANT
GLOVE BIOGEL PI IND STRL 7.0 (GLOVE) ×1 IMPLANT
GLOVE BIOGEL PI INDICATOR 6.5 (GLOVE) ×2
GLOVE BIOGEL PI INDICATOR 7.0 (GLOVE) ×1
GLOVE SS BIOGEL STRL SZ 7.5 (GLOVE) ×1 IMPLANT
GLOVE SUPERSENSE BIOGEL SZ 7.5 (GLOVE) ×1
GLOVE SURG SS PI 6.5 STRL IVOR (GLOVE) ×4 IMPLANT
GLOVE SURG SS PI 7.0 STRL IVOR (GLOVE) ×2 IMPLANT
GOWN STRL REUS W/ TWL LRG LVL3 (GOWN DISPOSABLE) ×3 IMPLANT
GOWN STRL REUS W/ TWL XL LVL3 (GOWN DISPOSABLE) ×1 IMPLANT
GOWN STRL REUS W/TWL LRG LVL3 (GOWN DISPOSABLE) ×3
GOWN STRL REUS W/TWL XL LVL3 (GOWN DISPOSABLE) ×1
KIT BASIN OR (CUSTOM PROCEDURE TRAY) ×2 IMPLANT
KIT ROOM TURNOVER OR (KITS) ×2 IMPLANT
NS IRRIG 1000ML POUR BTL (IV SOLUTION) ×4 IMPLANT
PACK PERIPHERAL VASCULAR (CUSTOM PROCEDURE TRAY) ×2 IMPLANT
PAD ARMBOARD 7.5X6 YLW CONV (MISCELLANEOUS) ×4 IMPLANT
PATCH HEMASHIELD 8X75 (Vascular Products) ×2 IMPLANT
SET COLLECT BLD 21X3/4 12 (NEEDLE) IMPLANT
STOPCOCK 4 WAY LG BORE MALE ST (IV SETS) IMPLANT
STRIP CLOSURE SKIN 1/2X4 (GAUZE/BANDAGES/DRESSINGS) ×2 IMPLANT
SUT ETHILON 3 0 PS 1 (SUTURE) IMPLANT
SUT PROLENE 5 0 C 1 24 (SUTURE) ×2 IMPLANT
SUT PROLENE 6 0 CC (SUTURE) ×8 IMPLANT
SUT VIC AB 2-0 CTX 36 (SUTURE) ×4 IMPLANT
SUT VIC AB 3-0 SH 27 (SUTURE) ×2
SUT VIC AB 3-0 SH 27X BRD (SUTURE) ×2 IMPLANT
TUBING EXTENTION W/L.L. (IV SETS) IMPLANT
UNDERPAD 30X30 (UNDERPADS AND DIAPERS) ×2 IMPLANT
WATER STERILE IRR 1000ML POUR (IV SOLUTION) ×2 IMPLANT

## 2016-09-14 NOTE — Interval H&P Note (Signed)
History and Physical Interval Note:  09/14/2016 12:15 PM  [name] [name]  has presented today for surgery, with the diagnosis of Peripheral vascular disease  The various methods of treatment have been discussed with the patient and family. After consideration of risks, benefits and other options for treatment, the patient has consented to  Procedure(s): ENDARTERECTOMY FEMORAL (Right) as a surgical intervention .  The patient's history has been reviewed, patient examined, no change in status, stable for surgery.  I have reviewed the patient's chart and labs.  Questions were answered to the patient's satisfaction.     [name]

## 2016-09-14 NOTE — Anesthesia Preprocedure Evaluation (Signed)
Anesthesia Evaluation  Patient identified by MRN, date of birth, ID band Patient awake    Reviewed: Allergy & Precautions, NPO status , Patient's Chart, lab work & pertinent test results  Airway Mallampati: II  TM Distance: >3 FB Neck ROM: Full    Dental  (+) Dental Advisory Given   Pulmonary shortness of breath,  pulm HTN   breath sounds clear to auscultation       Cardiovascular hypertension, + Peripheral Vascular Disease   Rhythm:Regular Rate:Normal  5/17 ECHO:  Ef 65-70%, mod TR   Neuro/Psych Depression    GI/Hepatic negative GI ROS, Neg liver ROS,   Endo/Other  Scleroderma: steroids  Renal/GU Renal InsufficiencyRenal disease (creat 1.52)     Musculoskeletal  (+) Arthritis ,   Abdominal   Peds  Hematology  (+) anemia , Hb 10.9   Anesthesia Other Findings   Reproductive/Obstetrics                            Anesthesia Physical Anesthesia Plan  ASA: III  Anesthesia Plan: General   Post-op Pain Management:    Induction: Intravenous  Airway Management Planned: Oral ETT  Additional Equipment:   Intra-op Plan:   Post-operative Plan: Extubation in OR  Informed Consent: I have reviewed the patients History and Physical, chart, labs and discussed the procedure including the risks, benefits and alternatives for the proposed anesthesia with the patient or authorized representative who has indicated his/her understanding and acceptance.   Dental advisory given  Plan Discussed with: CRNA and Surgeon  Anesthesia Plan Comments: (Plan routine monitors, GETA)        Anesthesia Quick Evaluation

## 2016-09-14 NOTE — Anesthesia Procedure Notes (Signed)
Procedure Name: Intubation Date/Time: 09/14/2016 1:12 PM Performed by: Izora Gala Pre-anesthesia Checklist: Patient identified, Emergency Drugs available, Suction available and Patient being monitored Patient Re-evaluated:Patient Re-evaluated prior to inductionOxygen Delivery Method: Circle system utilized Preoxygenation: Pre-oxygenation with 100% oxygen Intubation Type: IV induction Ventilation: Mask ventilation without difficulty Laryngoscope Size: Miller and 3 Grade View: Grade I Tube type: Oral Number of attempts: 1 Airway Equipment and Method: Stylet and LTA kit utilized Placement Confirmation: ETT inserted through vocal cords under direct vision,  positive ETCO2 and breath sounds checked- equal and bilateral Secured at: 21 cm Tube secured with: Tape Dental Injury: Teeth and Oropharynx as per pre-operative assessment

## 2016-09-14 NOTE — Transfer of Care (Signed)
Immediate Anesthesia Transfer of Care Note  Patient: [name] [name]  Procedure(s) Performed: Procedure(s): ENDARTERECTOMY FEMORAL RIGHT (Right) PATCH ANGIOPLASTY RIGHT FEMORAL ARTERY USING HEMASHIELD PLATINUM FINESSE PATCH (Right)  Patient Location: PACU  Anesthesia Type:General  Level of Consciousness: awake, alert , oriented and patient cooperative  Airway & Oxygen Therapy: Patient Spontanous Breathing and Patient connected to nasal cannula oxygen  Post-op Assessment: Report given to RN, Post -op Vital signs reviewed and stable and Patient moving all extremities  Post vital signs: Reviewed and stable  Last Vitals:  Vitals:   09/14/16 1055  BP: (!) 140/43  Pulse: (!) 59  Resp: 18  Temp: 36.8 C    Last Pain: There were no vitals filed for this visit.    Patients Stated Pain Goal: 1 (47/34/03 7096)  Complications: No apparent anesthesia complications

## 2016-09-14 NOTE — H&P (View-Only) (Signed)
Reviewed arteriogram and discussed with the patient and her husband present. Flow-limiting stenosis and heavily calcified common femoral artery proximal to superficial femoral and profunda takeoff.  On physical exam she does have a palpable popliteal pulse distal to this.  Have recommended right femoral endarterectomy for improvement in arterial flow to her right foot. Has had very difficult time healing of her mixed venous and arterial ulcer. She wishes to proceed as soon as possible

## 2016-09-14 NOTE — Op Note (Addendum)
    OPERATIVE REPORT  DATE OF SURGERY: 09/14/2016  PATIENT: [name] [name], 65 y.o. female MRN: [id number]  DOB: [birth year]  PRE-OPERATIVE DIAGNOSIS: Arterial insufficiency right lower extremity  POST-OPERATIVE DIAGNOSIS:  Same  PROCEDURE: Right femoral endarterectomy and Dacron patch angioplasty  SURGEON:  [name], M.D.  PHYSICIAN ASSISTANT: Silva Bandy PA-C  ANESTHESIA:  Gen.  EBL: Minimal ml  Total I/O In: 1000 [I.V.:1000] Out: 40 [Blood:40]  BLOOD ADMINISTERED: None  DRAINS: None  SPECIMEN: None  COUNTS CORRECT:  YES  PLAN OF CARE: PACU   PATIENT DISPOSITION:  PACU - hemodynamically stable  PROCEDURE DETAILS: Patient was taken to the operating placed supine position where the area of the right groin prepped and draped in usual sterile fashion. Oblique incision was made over the femoral pulse and carried down to isolate the common superficial femoral and profundus femoris arteries. The patient had extensive atherosclerotic plaque in the common femoral artery. There was calcification in the external iliac artery at the inguinal ligament but the artery was relatively soft. The superficial femoral and profundus reverse arteries were dissected out past the bifurcation. Patient was given 6000 units of intravenous heparin. After adequate circulation time the common superficial femoral and profundus femoris arteries were occluded. The common femoral artery was opened with 11 blade incision longitudinally with Potts scissors. There was extensive coral reef plaque in the common femoral artery. The endarterectomy was again on the common femoral artery was extended up to the level of the inguinal ligament. The plaque was transected at this level. Dissection was carried down to the orifice of the deep femoral artery and just past the orifice of the superficial femoral artery. The plaque was divided in the superficial femoral and at the orifice of the deep femoral artery. There was no  flow-limiting narrowing in the superficial femoral artery or deep femoral artery. Multiple 6-0 Prolene sutures used to tack the plaque distally on the SFA and deep femoral artery. A Finesse Hemashield Dacron patch was brought onto the field and was sewn as a patch angioplasty with a running 6-0 Prolene suture. Prior to completion of the closure the usual flushing maneuvers were undertaken. Anastomosis was completed and clamps removed and the excellent Doppler flow was noted through the dorsalis pedis and posterior tibial artery. The patient was given 50 mg of protamine to reverse the heparin. Wounds irrigated with saline. Hemostasis tablet cautery. The wounds were closed with several layers of 2-0 Vicryl the subcutaneous tissue. Skin was closed with 3-0 subcuticular Vicryl suture. Sterile dressing was applied the patient was transferred to the recovery room in stable condition   [name], M.D. 09/14/2016 3:32 PM

## 2016-09-14 NOTE — Anesthesia Procedure Notes (Signed)
Performed by: Izora Gala

## 2016-09-15 LAB — CBC
HCT: 33.1 % — ABNORMAL LOW (ref 36.0–46.0)
Hemoglobin: 9.9 g/dL — ABNORMAL LOW (ref 12.0–15.0)
MCH: 30.8 pg (ref 26.0–34.0)
MCHC: 29.9 g/dL — ABNORMAL LOW (ref 30.0–36.0)
MCV: 103.1 fL — ABNORMAL HIGH (ref 78.0–100.0)
Platelets: 218 10*3/uL (ref 150–400)
RBC: 3.21 MIL/uL — ABNORMAL LOW (ref 3.87–5.11)
RDW: 17 % — ABNORMAL HIGH (ref 11.5–15.5)
WBC: 12.2 10*3/uL — ABNORMAL HIGH (ref 4.0–10.5)

## 2016-09-15 LAB — BASIC METABOLIC PANEL
Anion gap: 9 (ref 5–15)
BUN: 28 mg/dL — ABNORMAL HIGH (ref 6–20)
CO2: 27 mmol/L (ref 22–32)
Calcium: 8.4 mg/dL — ABNORMAL LOW (ref 8.9–10.3)
Chloride: 103 mmol/L (ref 101–111)
Creatinine, Ser: 1.09 mg/dL — ABNORMAL HIGH (ref 0.44–1.00)
GFR calc Af Amer: 60 mL/min — ABNORMAL LOW (ref >60)
GFR calc non Af Amer: 52 mL/min — ABNORMAL LOW (ref >60)
Glucose, Bld: 89 mg/dL (ref 65–99)
Potassium: 3.6 mmol/L (ref 3.5–5.1)
Sodium: 139 mmol/L (ref 135–145)

## 2016-09-15 MED ORDER — OXYCODONE-ACETAMINOPHEN 5-325 MG PO TABS: 1.0000 | ORAL_TABLET | Freq: Four times a day (QID) | ORAL | 0 refills | Status: DC | PRN

## 2016-09-15 NOTE — Progress Notes (Signed)
Patient ambulated approximately 300 feet around the unit using a front wheel walker on 4L O2. Patient denied feeling SOB or dizziness. The only pain patient endorsed was a nagging pain in her ankle where her ulcers are. Following ambulation, patient is sitting comfortably in the chair.  Will continue to monitor.

## 2016-09-15 NOTE — Progress Notes (Addendum)
[facility]  Subjective  - some pain right calf wound   Objective (!) 101/51 74 98.3 F (36.8 C) (Oral) 14 94%  Intake/Output Summary (Last 24 hours) at 09/15/16 0757 Last data filed at 09/15/16 0615  Gross per 24 hour  Intake          2841.67 ml  Output              590 ml  Net          2251.67 ml   Right groin incision clean without hematoma Right foot warm  Assessment/Planning: Acute blood loss anemia asymptomatic D/c home today Follow up Dr [name]   [name] [name] 09/15/2016 7:57 AM --  Laboratory Lab Results:  Recent Labs  09/15/16 0258  WBC 12.2*  HGB 9.9*  HCT 33.1*  PLT 218   BMET  Recent Labs  09/15/16 0258  NA 139  K 3.6  CL 103  CO2 27  GLUCOSE 89  BUN 28*  CREATININE 1.09*  CALCIUM 8.4*    COAG Lab Results  Component Value Date   INR 0.90 09/11/2016   INR 0.96 05/29/2013   INR 0.98 01/10/2013   No results found for: PTT

## 2016-09-17 ENCOUNTER — Encounter (HOSPITAL_COMMUNITY): Payer: Self-pay | Admitting: Vascular Surgery

## 2016-09-17 ENCOUNTER — Telehealth: Payer: Self-pay | Admitting: Vascular Surgery

## 2016-09-17 ENCOUNTER — Telehealth: Payer: Self-pay | Admitting: Internal Medicine

## 2016-09-17 NOTE — Telephone Encounter (Signed)
-----  Message from Mena Goes, RN sent at 09/17/2016  9:53 AM EDT ----- Regarding: 2 weeks   ----- Message ----- From: Alvia Grove, PA-C Sent: 09/15/2016   7:50 AM To: [facility] Charge Pool  S/p right femoral endarterectomy 09/14/16  F/u with Dr. [name] in 2 weeks.   Thanks Maudie Mercury

## 2016-09-17 NOTE — Telephone Encounter (Signed)
Error

## 2016-09-17 NOTE — Anesthesia Postprocedure Evaluation (Signed)
Anesthesia Post Note  Patient: [name] [name]  Procedure(s) Performed: Procedure(s) (LRB): ENDARTERECTOMY FEMORAL RIGHT (Right) PATCH ANGIOPLASTY RIGHT FEMORAL ARTERY USING HEMASHIELD PLATINUM FINESSE PATCH (Right)  Patient location during evaluation: PACU Anesthesia Type: General Level of consciousness: sedated, oriented and patient cooperative Pain management: pain level controlled Vital Signs Assessment: post-procedure vital signs reviewed and stable Respiratory status: spontaneous breathing, nonlabored ventilation, respiratory function stable and patient connected to nasal cannula oxygen Cardiovascular status: blood pressure returned to baseline and stable Postop Assessment: no signs of nausea or vomiting Anesthetic complications: no Comments: Delayed entry: eval in PACU    Last Vitals:  Vitals:   09/15/16 0600 09/15/16 0752  BP: (!) 101/51 (!) 99/50  Pulse: 74 74  Resp: 14 17  Temp:  36.7 C    Last Pain:  Vitals:   09/15/16 0752  TempSrc: Axillary  PainSc:                  Jersee Winiarski,E. Sareena Odeh

## 2016-09-17 NOTE — Telephone Encounter (Signed)
Spoke to pt on home # advised of appt on 10/10 @ 8:45 09/17/16 beg

## 2016-09-19 NOTE — Discharge Summary (Addendum)
[facility] Discharge Summary  ELLYN [name] [birth year] 65 y.o. female  [id number]  Admission Date: 09/14/2016  Discharge Date: 09/15/2016  Physician: [name] Jews, MD  Admission Diagnosis: Peripheral vascular disease  HPI:   This is a 65 y.o. female who presented for for follow-up of her venous ulceration. She is well-known to Dr. Donnetta [name] from prior ablation of her great saphenous vein. She had severe venous hypertension with severe venous ulceration. This has progressed despite maximal medical therapy with the wound center. She has multiple very large deep ulcers over calf extending down onto her ankle. She does have known arterial insufficiency with a prior history of bilateral lower extremity stenting in [facility] several years ago. She is on chronic prednisone therapy related to her scleroderma.  Hospital Course:  The patient was admitted to the hospital and taken to the operating room on 09/14/2016 and underwent: Right femoral endarterectomy and Dacron patch angioplasty    The patient tolerated the procedure well and was transported to the PACU in stable condition.   On POD 1, the patient was doing well. Her right groin incision was clean without hematoma. Her right foot was warm. She had acute blood loss anemia but was asymptomatic. She was discharged home on POD 1 in good condition.    CBC    Component Value Date/Time   WBC 12.2 (H) 09/15/2016 0258   RBC 3.21 (L) 09/15/2016 0258   HGB 9.9 (L) 09/15/2016 0258   HCT 33.1 (L) 09/15/2016 0258   PLT 218 09/15/2016 0258   MCV 103.1 (H) 09/15/2016 0258   MCH 30.8 09/15/2016 0258   MCHC 29.9 (L) 09/15/2016 0258   RDW 17.0 (H) 09/15/2016 0258   LYMPHSABS 0.4 (L) 07/29/2016 1000   MONOABS 0.8 07/29/2016 1000   EOSABS 0.0 07/29/2016 1000   BASOSABS 0.0 07/29/2016 1000    BMET    Component Value Date/Time   NA 139 09/15/2016 0258   K 3.6 09/15/2016 0258   CL 103 09/15/2016 0258   CO2 27  09/15/2016 0258   GLUCOSE 89 09/15/2016 0258   BUN 28 (H) 09/15/2016 0258   CREATININE 1.09 (H) 09/15/2016 0258   CREATININE 1.15 (H) 06/21/2014 1409   CALCIUM 8.4 (L) 09/15/2016 0258   GFRNONAA 52 (L) 09/15/2016 0258   GFRAA 60 (L) 09/15/2016 0258     Discharge Instructions:   The patient is discharged to home with extensive instructions on wound care and progressive ambulation.  They are instructed not to drive or perform any heavy lifting until returning to see the physician in his office.  Discharge Instructions    Call MD for:  redness, tenderness, or signs of infection (pain, swelling, bleeding, redness, odor or green/yellow discharge around incision site)    Complete by:  As directed    Call MD for:  severe or increased pain, loss or decreased feeling  in affected limb(s)    Complete by:  As directed    Call MD for:  temperature >100.5    Complete by:  As directed    Discharge wound care:    Complete by:  As directed    Wash the groin wound with soap and water daily and pat dry. (No tub bath-only shower)  Then put a dry gauze or washcloth there to keep this area dry daily and as needed.  Do not use Vaseline or neosporin on your incisions.  Only use soap and water on your incisions and then protect and keep dry.  Driving Restrictions    Complete by:  As directed    No driving for 2 weeks   Increase activity slowly    Complete by:  As directed    Walk with assistance use walker or cane as needed   Lifting restrictions    Complete by:  As directed    No lifting for 2 weeks   Resume previous diet    Complete by:  As directed       Discharge Diagnosis:  Peripheral vascular disease  Secondary Diagnosis: Patient Active Problem List   Diagnosis Date Noted  . Atherosclerosis of native arteries of right leg with ulceration of unspecified site ([place]) 09/14/2016  . Gastrointestinal bleed 07/29/2016  . Prolonged Q-T interval on ECG 07/29/2016  . CKD (chronic kidney disease),  stage III 07/29/2016  . Routine general medical examination at a health care facility 06/29/2016  . UGI bleed 05/06/2016  . Scleroderma ([place]) 05/06/2016  . Absolute anemia 05/02/2016  . Depression 04/24/2016  . Fungal esophagitis; Probable per EGD 10/07/2015 10/08/2015  . Hypokalemia 10/07/2015  . Anemia, iron deficiency   . 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 Allergic rhinitis 03/08/2015  . Varicose veins of lower extremities with ulcer and inflammation ([place]) 07/24/2012  . 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 Pulmonary hypertension associated with systemic disorder ([place]) 03/25/2012  . Peripheral arterial occlusive disease ([place]) 08/10/2011  . Scleroderma, circumscribed or localized 08/10/2011  . Essential hypertension 08/10/2011  . Hyperlipidemia 08/10/2011   Past Medical History:  Diagnosis Date  . Anemia   . Arthritis   . DOE (dyspnea on exertion)   . Epistaxis   . History of chicken pox   . Hypertension   . Leg ulcer ([place])   . Melanoma ([place]) 1999  . 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[place])   . Non Hodgkin's lymphoma ([place])    Treated with chemo 2010, felt to be cured.  Marland Kitchen PAH (pulmonary artery hypertension) ([place])   . Peripheral arterial occlusive disease ([place])   . Pulmonary hypertension ([place])   . Requires supplemental oxygen    2 liters  . 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   atorvastatin 10 MG tablet Commonly known as:  LIPITOR Take 1 tablet (10 mg total) by mouth daily.   BIOTIN PO Take 1 tablet by mouth daily.   cholecalciferol 1000 units tablet Commonly known as:  VITAMIN D Take 2,000 Units by mouth every evening.   citalopram 20 MG tablet Commonly known as:  CELEXA Take 40 mg by mouth daily.   clopidogrel 75 MG tablet Commonly known as:  PLAVIX take 1 tablet by mouth once daily   fish oil-omega-3  fatty acids 1000 MG capsule Take 1 g by mouth 2 (two) times daily.   gabapentin 300 MG capsule Commonly known as:  NEURONTIN Take 600 mg by mouth 2 (two) times daily.   iron polysaccharides 150 MG capsule Commonly known as:  POLY-IRON 150 Take 1 capsule (150 mg total) by mouth 2 (two) times daily.   Macitentan 10 MG Tabs Commonly known as:  OPSUMIT Take 10 mg by mouth daily.   moxifloxacin 0.5 % ophthalmic solution Commonly known as:  VIGAMOX Place 1 drop into both eyes 3 (three) times daily as needed (allergies).   multivitamin per tablet Take 1 tablet by mouth daily.  oxyCODONE-acetaminophen 5-325 MG tablet Commonly known as:  PERCOCET/ROXICET Take 1-2 tablets by mouth every 6 (six) hours as needed for moderate pain.   OXYGEN Inhale 2 L into the lungs continuous.   pantoprazole 40 MG tablet Commonly known as:  PROTONIX Take 1 tablet (40 mg total) by mouth 2 (two) times daily.   potassium chloride 10 MEQ tablet Commonly known as:  K-DUR take 1 tablet by mouth once daily   predniSONE 5 MG tablet Commonly known as:  DELTASONE Take 5 mg by mouth daily with breakfast.   SANTYL ointment Generic drug:  collagenase APP TO RIGHT LEG ULCER EVERY OTHER DAY   sildenafil 20 MG tablet Commonly known as:  REVATIO Take 2 tablets (40 mg total) by mouth 3 (three) times daily.   spironolactone 25 MG tablet Commonly known as:  ALDACTONE Take 1 tablet (25 mg total) by mouth daily.   torsemide 20 MG tablet Commonly known as:  DEMADEX Take 2 tablets (40 mg total) by mouth 2 (two) times daily.   vitamin B-12 500 MCG tablet Commonly known as:  CYANOCOBALAMIN Take 500 mcg by mouth every morning.       Percocet #30 No Refill  Disposition: Home  Patient's condition: is Good  Follow up: 1. Dr. Donnetta [name] in 2 weeks   Virgina Jock, PA-C [facility] [phone] 09/19/2016  2:08 PM  - For VQI Registry use --- Instructions: Press F2 to tab through  selections.  Delete question if not applicable.   Post-op:  Wound infection: No  Graft infection: No  Transfusion: No   New Arrhythmia: No Ipsilateral amputation: No, _0  Minor, _1  BKA, _2  AKA D/C Ambulatory Status: Ambulatory  Complications: MI: No, <QMVHQIONGEXBMWUX>_3<\/KGMWNUUVOZDGUYQI>_3  Troponin only, _4  EKG or Clinical CHF: No Resp failure:No, _5  Pneumonia, _6  Ventilator Chg in renal function: No, _7  Inc. Cr > 0.5, _8  Temp. Dialysis, _9  Permanent dialysis Stroke: No, _10  Minor, _11  Major Return to OR: No  Reason for return to OR: _12  Bleeding, _13  Infection, _14  Thrombosis, _15  Revision  Discharge medications: Statin use:  yes ASA use:  no Plavix use:  yes Beta blocker use: no Coumadin use: no  Addendum   ABLA hgb 9.9, down from 10.9 three days prior. Asymptomatic without tachycardia or weakness.   Virgina Jock, PA-C 10/29/16

## 2016-09-20 DIAGNOSIS — L97511 Non-pressure chronic ulcer of other part of right foot limited to breakdown of skin: Secondary | ICD-10-CM | POA: Diagnosis not present

## 2016-09-20 DIAGNOSIS — I272 Other secondary pulmonary hypertension: Secondary | ICD-10-CM | POA: Diagnosis not present

## 2016-09-20 DIAGNOSIS — M349 Systemic sclerosis, unspecified: Secondary | ICD-10-CM | POA: Diagnosis not present

## 2016-09-20 DIAGNOSIS — L97311 Non-pressure chronic ulcer of right ankle limited to breakdown of skin: Secondary | ICD-10-CM | POA: Diagnosis not present

## 2016-09-20 DIAGNOSIS — L97811 Non-pressure chronic ulcer of other part of right lower leg limited to breakdown of skin: Secondary | ICD-10-CM | POA: Diagnosis not present

## 2016-09-20 DIAGNOSIS — I83222 Varicose veins of left lower extremity with both ulcer of calf and inflammation: Secondary | ICD-10-CM | POA: Diagnosis not present

## 2016-09-24 ENCOUNTER — Encounter: Payer: Self-pay | Admitting: Internal Medicine

## 2016-10-01 ENCOUNTER — Other Ambulatory Visit (INDEPENDENT_AMBULATORY_CARE_PROVIDER_SITE_OTHER): Payer: BLUE CROSS/BLUE SHIELD

## 2016-10-01 ENCOUNTER — Ambulatory Visit (INDEPENDENT_AMBULATORY_CARE_PROVIDER_SITE_OTHER): Payer: BLUE CROSS/BLUE SHIELD | Admitting: Internal Medicine

## 2016-10-01 ENCOUNTER — Encounter: Payer: Self-pay | Admitting: Internal Medicine

## 2016-10-01 VITALS — BP 132/58 | HR 60 | Temp 98.3°F | Resp 14 | Ht 67.0 in | Wt 140.0 lb

## 2016-10-01 DIAGNOSIS — I70233 Atherosclerosis of native arteries of right leg with ulceration of ankle: Secondary | ICD-10-CM

## 2016-10-01 DIAGNOSIS — D500 Iron deficiency anemia secondary to blood loss (chronic): Secondary | ICD-10-CM | POA: Diagnosis not present

## 2016-10-01 LAB — CBC
HCT: 32.5 % — ABNORMAL LOW (ref 36.0–46.0)
Hemoglobin: 10.8 g/dL — ABNORMAL LOW (ref 12.0–15.0)
MCHC: 33.3 g/dL (ref 30.0–36.0)
MCV: 95.1 fl (ref 78.0–100.0)
Platelets: 395 10*3/uL (ref 150.0–400.0)
RBC: 3.42 Mil/uL — ABNORMAL LOW (ref 3.87–5.11)
RDW: 16 % — ABNORMAL HIGH (ref 11.5–15.5)
WBC: 10.2 10*3/uL (ref 4.0–10.5)

## 2016-10-01 LAB — FERRITIN: Ferritin: 61 ng/mL (ref 10.0–291.0)

## 2016-10-01 MED ORDER — POLYSACCHARIDE IRON COMPLEX 150 MG PO CAPS: 150.0000 mg | ORAL_CAPSULE | Freq: Two times a day (BID) | ORAL | 5 refills | Status: DC

## 2016-10-01 MED ORDER — CITALOPRAM HYDROBROMIDE 20 MG PO TABS: 40.0000 mg | ORAL_TABLET | Freq: Every day | ORAL | 1 refills | Status: DC

## 2016-10-01 MED ORDER — TORSEMIDE 20 MG PO TABS: 40.0000 mg | ORAL_TABLET | Freq: Two times a day (BID) | ORAL | 1 refills | Status: DC

## 2016-10-01 NOTE — Progress Notes (Signed)
Subjective:    [name], female    DOB: 26-Mar-1951, 65 y.o.   MRN: [id number]  HPI The patient is a 65 YO female coming in for hospital follow up (had endarterectomy on 9/15 to help with blood flow to her ulcers on her right ankle, no bleeding since leaving the hospital). She is noticing more granulation tissue on her wounds when changing dressing. No leaking blood from the right groin. She is worried as her blood counts were falling while she was at the hospital and this is a recurrent issue for her. No lightheadedness or syncope.   Review of Systems  Constitutional: Negative for activity change, chills, fatigue, fever and unexpected weight change.  HENT: Positive for congestion.   Respiratory: Positive for shortness of breath. Negative for cough, chest tightness and wheezing.        Stable  Cardiovascular: Negative for chest pain, palpitations and leg swelling.  Gastrointestinal: Negative for abdominal distention, abdominal pain, blood in stool, constipation, diarrhea and nausea.  Musculoskeletal: Negative.   Skin: Positive for wound.       improving  Neurological: Negative.  Negative for syncope and light-headedness.      Objective:   Physical Exam  Constitutional: She is oriented to person, place, and time. She appears well-developed and well-nourished.  HENT:  Head: Normocephalic and atraumatic.  Oropharynx with mild erythema.   Eyes: EOM are normal.  Neck: Normal range of motion.  Cardiovascular: Normal rate and regular rhythm.   Pulmonary/Chest: Effort normal and breath sounds normal. No respiratory distress. She has no wheezes. She has no rales.  Wearing oxygen, right groin with healed wound, no signs of infection or rash  Abdominal: Soft. She exhibits no distension. There is no tenderness. There is no rebound.  Musculoskeletal: She exhibits no edema.  Neurological: She is alert and oriented to person, place, and time. Coordination normal.  Skin: Skin is  warm.  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 Right ankle wrapped so unable to visualize her ulcers.   Psychiatric: She has a normal mood and affect.   Vitals:   10/01/16 1105  BP: (!) 132/58  Pulse: 60  Resp: 14  Temp: 98.3 F (36.8 C)  TempSrc: Oral  Weight: 140 lb (63.5 kg)  Height: 5' 7" (1.702 m)      Assessment & Plan:

## 2016-10-01 NOTE — Assessment & Plan Note (Signed)
Checking CBC and ferritin today as levels were trending down in the hospital. No active bleeding or blood in stool she has seen.

## 2016-10-01 NOTE — Progress Notes (Signed)
Pre visit review using our clinic review tool, if applicable. No additional management support is needed unless otherwise documented below in the visit note.

## 2016-10-01 NOTE — Assessment & Plan Note (Signed)
S/P procedure and she is healed on exam with some scabbing still, no infection or leaking. Per her reports her ulcer are improving some as well.

## 2016-10-01 NOTE — Patient Instructions (Signed)
We will check the labs today and call you back with the results.    

## 2016-10-04 ENCOUNTER — Other Ambulatory Visit: Payer: Self-pay | Admitting: Internal Medicine

## 2016-10-04 ENCOUNTER — Encounter (HOSPITAL_BASED_OUTPATIENT_CLINIC_OR_DEPARTMENT_OTHER): Payer: BLUE CROSS/BLUE SHIELD | Attending: Internal Medicine

## 2016-10-04 DIAGNOSIS — L97312 Non-pressure chronic ulcer of right ankle with fat layer exposed: Secondary | ICD-10-CM | POA: Insufficient documentation

## 2016-10-04 DIAGNOSIS — L97812 Non-pressure chronic ulcer of other part of right lower leg with fat layer exposed: Secondary | ICD-10-CM | POA: Diagnosis present

## 2016-10-04 DIAGNOSIS — I87331 Chronic venous hypertension (idiopathic) with ulcer and inflammation of right lower extremity: Secondary | ICD-10-CM | POA: Diagnosis not present

## 2016-10-04 DIAGNOSIS — L97511 Non-pressure chronic ulcer of other part of right foot limited to breakdown of skin: Secondary | ICD-10-CM | POA: Insufficient documentation

## 2016-10-05 ENCOUNTER — Encounter: Payer: Self-pay | Admitting: Vascular Surgery

## 2016-10-09 ENCOUNTER — Encounter: Payer: Self-pay | Admitting: Vascular Surgery

## 2016-10-09 ENCOUNTER — Ambulatory Visit (INDEPENDENT_AMBULATORY_CARE_PROVIDER_SITE_OTHER): Payer: Self-pay | Admitting: Vascular Surgery

## 2016-10-09 VITALS — BP 119/67 | HR 68 | Temp 97.2°F | Resp 18 | Ht 67.0 in | Wt 139.2 lb

## 2016-10-09 DIAGNOSIS — I739 Peripheral vascular disease, unspecified: Secondary | ICD-10-CM

## 2016-10-09 NOTE — Addendum Note (Signed)
Addended by: Kaleen Mask on: 10/09/2016 10:04 AM   Modules accepted: Orders

## 2016-10-09 NOTE — Progress Notes (Signed)
Patient name: [name] [name] MRN: [id number] DOB: [birth year] Sex: female  REASON FOR VISIT: Follow-up right femoral endarterectomy and Dacron patch angioplasty on 09/14/2016  HPI: [name] [name] is a 65 y.o. female for follow-up. Has severe venous stasis ulceration on her right leg. Had a combined arterial insufficiency as well. Underwent endarterectomy of the very bulky lesion in her common femoral artery above her femoral bifurcation. Did well and had uneventful recovery. Is here today for follow-up. She is being followed at the wound center and reports that she is having a marked improvement in the ulcerations on her medial and lateral aspect of her calf. She has her multilayer dressing on today and this was not examined today in our visit.  Current Outpatient Prescriptions  Medication Sig Dispense Refill  . acidophilus (RISAQUAD) CAPS Take 1 capsule by mouth every morning.     Marland Kitchen atorvastatin (LIPITOR) 10 MG tablet Take 1 tablet (10 mg total) by mouth daily. 30 tablet 1  . BIOTIN PO Take 1 tablet by mouth daily.    . cholecalciferol (VITAMIN D) 1000 UNITS tablet Take 2,000 Units by mouth every evening.     . citalopram (CELEXA) 20 MG tablet Take 2 tablets (40 mg total) by mouth daily. 180 tablet 1  . clopidogrel (PLAVIX) 75 MG tablet take 1 tablet by mouth once daily 30 tablet 5  . fish oil-omega-3 fatty acids 1000 MG capsule Take 1 g by mouth 2 (two) times daily.     Marland Kitchen gabapentin (NEURONTIN) 300 MG capsule Take 600 mg by mouth 2 (two) times daily.    . iron polysaccharides (POLY-IRON 150) 150 MG capsule Take 1 capsule (150 mg total) by mouth 2 (two) times daily. 60 capsule 5  . Macitentan (OPSUMIT) 10 MG TABS Take 10 mg by mouth daily. 30 tablet 1  . moxifloxacin (VIGAMOX) 0.5 % ophthalmic solution Place 1 drop into both eyes 3 (three) times daily as needed (allergies).     . multivitamin (THERAGRAN) per tablet Take 1 tablet by mouth daily.     . OXYGEN  Inhale 2 L into the lungs continuous.    . pantoprazole (PROTONIX) 40 MG tablet Take 1 tablet (40 mg total) by mouth 2 (two) times daily. 60 tablet 1  . potassium chloride (K-DUR) 10 MEQ tablet take 1 tablet by mouth once daily 30 tablet 1  . predniSONE (DELTASONE) 5 MG tablet Take 5 mg by mouth daily with breakfast.    . SANTYL ointment APP TO RIGHT LEG ULCER EVERY OTHER DAY  3  . sildenafil (REVATIO) 20 MG tablet Take 2 tablets (40 mg total) by mouth 3 (three) times daily. 180 tablet 6  . spironolactone (ALDACTONE) 25 MG tablet Take 1 tablet (25 mg total) by mouth daily. 30 tablet 1  . torsemide (DEMADEX) 20 MG tablet Take 2 tablets (40 mg total) by mouth 2 (two) times daily. 120 tablet 1  . vitamin B-12 (CYANOCOBALAMIN) 500 MCG tablet Take 500 mcg by mouth every morning.     . clopidogrel (PLAVIX) 75 MG tablet take 1 tablet by mouth once daily RESUME IN ONE WEEK (Patient not taking: Reported on 10/09/2016) 30 tablet 1  . predniSONE (DELTASONE) 5 MG tablet take 1 tablet by mouth once daily with BREAKFAST (Patient not taking: Reported on 10/09/2016) 30 tablet 1   No current facility-administered medications for this visit.      PHYSICAL EXAM: Vitals:   10/09/16 0905  BP: 119/67  Pulse: 68  Resp: 18  Temp: 97.2 F (36.2 C)  TempSrc: Oral  SpO2: 95%  Weight: 139 lb 3.2 oz (63.1 kg)  Height: _0  (1.702 m)    GENERAL: The patient is a well-nourished female, in no acute distress. The vital signs are documented above. Right groin incision is healing quite nicely with no evidence of infection. Does have easily palpable 2+ right popliteal pulse  MEDICAL ISSUES: Stable follow-up after right femoral endarterectomy for improvement of flow to hopefully allow her healing of her severe venous stasis ulceration in her right lower extremity. We'll continue her wound care and will see [place] again in 3 months with ankle arm indices at that time   [name], MD [facility] Tel [phone] Pager [phone]

## 2016-10-11 ENCOUNTER — Other Ambulatory Visit: Payer: Self-pay | Admitting: Internal Medicine

## 2016-10-11 DIAGNOSIS — I87331 Chronic venous hypertension (idiopathic) with ulcer and inflammation of right lower extremity: Secondary | ICD-10-CM | POA: Diagnosis not present

## 2016-10-18 DIAGNOSIS — I87331 Chronic venous hypertension (idiopathic) with ulcer and inflammation of right lower extremity: Secondary | ICD-10-CM | POA: Diagnosis not present

## 2016-10-22 ENCOUNTER — Other Ambulatory Visit (INDEPENDENT_AMBULATORY_CARE_PROVIDER_SITE_OTHER): Payer: BLUE CROSS/BLUE SHIELD

## 2016-10-22 DIAGNOSIS — I272 Pulmonary hypertension, unspecified: Secondary | ICD-10-CM | POA: Diagnosis not present

## 2016-10-22 LAB — CBC
HCT: 31.9 % — ABNORMAL LOW (ref 36.0–46.0)
Hemoglobin: 10.5 g/dL — ABNORMAL LOW (ref 12.0–15.0)
MCHC: 32.9 g/dL (ref 30.0–36.0)
MCV: 94.8 fl (ref 78.0–100.0)
Platelets: 342 10*3/uL (ref 150.0–400.0)
RBC: 3.36 Mil/uL — ABNORMAL LOW (ref 3.87–5.11)
RDW: 15.4 % (ref 11.5–15.5)
WBC: 11.9 10*3/uL — ABNORMAL HIGH (ref 4.0–10.5)

## 2016-10-22 LAB — COMPREHENSIVE METABOLIC PANEL
ALT: 31 U/L (ref 0–35)
AST: 32 U/L (ref 0–37)
Albumin: 3.9 g/dL (ref 3.5–5.2)
Alkaline Phosphatase: 183 U/L — ABNORMAL HIGH (ref 39–117)
BUN: 45 mg/dL — ABNORMAL HIGH (ref 6–23)
CO2: 29 mEq/L (ref 19–32)
Calcium: 9.7 mg/dL (ref 8.4–10.5)
Chloride: 97 mEq/L (ref 96–112)
Creatinine, Ser: 1.34 mg/dL — ABNORMAL HIGH (ref 0.40–1.20)
GFR: 41.92 mL/min — ABNORMAL LOW (ref >60.00)
Glucose, Bld: 128 mg/dL — ABNORMAL HIGH (ref 70–99)
Potassium: 4.1 mEq/L (ref 3.5–5.1)
Sodium: 136 mEq/L (ref 135–145)
Total Bilirubin: 0.5 mg/dL (ref 0.2–1.2)
Total Protein: 8 g/dL (ref 6.0–8.3)

## 2016-10-23 ENCOUNTER — Telehealth: Payer: Self-pay

## 2016-10-23 NOTE — Telephone Encounter (Signed)
Pt. called to report increased swelling, heat, and pain in the right groin incisional area.  Reported the incision remains intact; no drainage.  Reported it is painful to bend, and is noticeable when she walks.  Reported the discomfort goes down into the right thigh with walking.  Denied fever or chills.  Appt. given for 1:00 PM 10/25 to evaluate for infection.  Agreed.

## 2016-10-24 ENCOUNTER — Ambulatory Visit (INDEPENDENT_AMBULATORY_CARE_PROVIDER_SITE_OTHER): Payer: Self-pay | Admitting: Vascular Surgery

## 2016-10-24 ENCOUNTER — Encounter: Payer: Self-pay | Admitting: Vascular Surgery

## 2016-10-24 ENCOUNTER — Ambulatory Visit (HOSPITAL_COMMUNITY)
Admission: RE | Admit: 2016-10-24 | Discharge: 2016-10-24 | Disposition: A | Discharge status: Home / Self Care | Payer: BLUE CROSS/BLUE SHIELD | Source: Ambulatory Visit | Attending: Vascular Surgery | Admitting: Vascular Surgery

## 2016-10-24 VITALS — BP 133/65 | HR 83 | Temp 98.6°F | Resp 20 | Ht 67.0 in | Wt 143.2 lb

## 2016-10-24 DIAGNOSIS — M7989 Other specified soft tissue disorders: Secondary | ICD-10-CM | POA: Diagnosis not present

## 2016-10-24 MED ORDER — CEPHALEXIN 500 MG PO CAPS: 500.0000 mg | ORAL_CAPSULE | Freq: Three times a day (TID) | ORAL | 0 refills | Status: DC

## 2016-10-24 NOTE — Progress Notes (Signed)
POST OPERATIVE OFFICE NOTE    CC:  F/u for surgery  HPI:  This is a 65 y.o. female who is s/p Femoral endarterectomy by Dr. [name] 09/09/2016.  She was last seen on 10/09/2016 by Dr. [name] and was doing well.  Sh has recently developed erythema of the right thigh.  No chills or drainage from the right groin incision.  She had a temp of 99 2 days ago, but non since then.    Allergies  Allergen Reactions  . Sulfa Antibiotics Rash    Severe rash with significant peeling of face. No airway involvement per patient  . Levaquin [Levofloxacin In D5w] Other (See Comments)    FLU LIKE SYMPTOM  . Penicillins Rash    Has patient had a PCN reaction causing immediate rash, facial/tongue/throat swelling, SOB or lightheadedness with hypotension: no Has patient had a PCN reaction causing severe rash involving mucus membranes or skin necrosis:no Has patient had a PCN reaction that required hospitalization no Has patient had a PCN reaction occurring within the last 10 years: {no If all of the above answers are "NO", then may proceed with Cephalosporin use.    Current Outpatient Prescriptions  Medication Sig Dispense Refill  . acidophilus (RISAQUAD) CAPS Take 1 capsule by mouth every morning.     Marland Kitchen atorvastatin (LIPITOR) 10 MG tablet Take 1 tablet (10 mg total) by mouth daily. 30 tablet 1  . BIOTIN PO Take 1 tablet by mouth daily.    . cholecalciferol (VITAMIN D) 1000 UNITS tablet Take 2,000 Units by mouth every evening.     . citalopram (CELEXA) 20 MG tablet Take 2 tablets (40 mg total) by mouth daily. 180 tablet 1  . clopidogrel (PLAVIX) 75 MG tablet take 1 tablet by mouth once daily 30 tablet 5  . fish oil-omega-3 fatty acids 1000 MG capsule Take 1 g by mouth 2 (two) times daily.     Marland Kitchen gabapentin (NEURONTIN) 300 MG capsule Take 600 mg by mouth 2 (two) times daily.    . iron polysaccharides (POLY-IRON 150) 150 MG capsule Take 1 capsule (150 mg total) by mouth 2 (two) times daily. 60 capsule 5  .  Macitentan (OPSUMIT) 10 MG TABS Take 10 mg by mouth daily. 30 tablet 1  . moxifloxacin (VIGAMOX) 0.5 % ophthalmic solution Place 1 drop into both eyes 3 (three) times daily as needed (allergies).     . multivitamin (THERAGRAN) per tablet Take 1 tablet by mouth daily.     . OXYGEN Inhale 2 L into the lungs continuous.    . pantoprazole (PROTONIX) 40 MG tablet Take 1 tablet (40 mg total) by mouth 2 (two) times daily. 60 tablet 1  . potassium chloride (K-DUR) 10 MEQ tablet take 1 tablet by mouth once daily 30 tablet 1  . predniSONE (DELTASONE) 5 MG tablet Take 5 mg by mouth daily with breakfast.    . SANTYL ointment APP TO RIGHT LEG ULCER EVERY OTHER DAY  3  . sildenafil (REVATIO) 20 MG tablet Take 2 tablets (40 mg total) by mouth 3 (three) times daily. 180 tablet 6  . spironolactone (ALDACTONE) 25 MG tablet Take 1 tablet (25 mg total) by mouth daily. 30 tablet 1  . torsemide (DEMADEX) 20 MG tablet Take 2 tablets (40 mg total) by mouth 2 (two) times daily. 120 tablet 1  . vitamin B-12 (CYANOCOBALAMIN) 500 MCG tablet Take 500 mcg by mouth every morning.     . cephALEXin (KEFLEX) 500 MG capsule Take 1 capsule (  500 mg total) by mouth 3 (three) times daily. 42 capsule 0  . clopidogrel (PLAVIX) 75 MG tablet take 1 tablet by mouth once daily RESUME IN ONE WEEK (Patient not taking: Reported on 10/24/2016) 30 tablet 1  . predniSONE (DELTASONE) 5 MG tablet take 1 tablet by mouth once daily with BREAKFAST (Patient not taking: Reported on 10/24/2016) 30 tablet 1   No current facility-administered medications for this visit.      ROS:  See HPI  Physical Exam:  Vitals:   10/24/16 1300  BP: 133/65  Pulse: 83  Resp: 20  Temp: 98.6 F (37 C)   Venous duplex to r/o DVT Chronic thrombus in the small saphenous No DVT otherwise note in the right LE  Incision:  Right groin healing well without drainage or dehiscences.  Palpable right femoral pulse. Extremities:  Right lower leg with multi layered  dressing follow by the wound center. Right thigh skin is warm and erythematous with firmness to palpation.  Assessment/Plan:  This is a 65 y.o. female who is s/p: Right femoral endarterectomy 09/09/2016.  We will place her on 2 weeks of keflex TID. I will give a follow up appt. With Dr. [name] for 2 weeks.  She will call sooner if she has concerns.     Theda Sers EMMA [facility] PA-C [facility] [phone]  Clinic MD:  Pt seen and examined with Dr. Oneida Alar  History and exam findings as above. The patient does have some erythema in her right leg/thigh.  There is no drainage from her right groin. There is no fluctuance. Ultrasound was done today which showed no DVT or obvious fluid collection. Patient has a prosthetic patch with some risk of infection. We will place her on Keflex today. We confirmed with her that she has not had significant allergic reaction to this despite allergy being listed to penicillin.  She will follow-up in 2 weeks with Dr. [name].  Ruta Hinds, MD [facility] [place] Office: [phone] Pager: [phone]

## 2016-10-25 DIAGNOSIS — I87331 Chronic venous hypertension (idiopathic) with ulcer and inflammation of right lower extremity: Secondary | ICD-10-CM | POA: Diagnosis not present

## 2016-10-26 ENCOUNTER — Other Ambulatory Visit (HOSPITAL_COMMUNITY): Payer: Self-pay | Admitting: Internal Medicine

## 2016-10-26 DIAGNOSIS — C8589 Other specified types of non-Hodgkin lymphoma, extranodal and solid organ sites: Secondary | ICD-10-CM

## 2016-10-31 ENCOUNTER — Ambulatory Visit (INDEPENDENT_AMBULATORY_CARE_PROVIDER_SITE_OTHER): Payer: BLUE CROSS/BLUE SHIELD | Admitting: Adult Health

## 2016-10-31 ENCOUNTER — Ambulatory Visit (INDEPENDENT_AMBULATORY_CARE_PROVIDER_SITE_OTHER)
Admission: RE | Admit: 2016-10-31 | Discharge: 2016-10-31 | Disposition: A | Discharge status: Home / Self Care | Payer: BLUE CROSS/BLUE SHIELD | Source: Ambulatory Visit | Attending: Adult Health | Admitting: Adult Health

## 2016-10-31 ENCOUNTER — Encounter: Payer: Self-pay | Admitting: Adult Health

## 2016-10-31 VITALS — BP 128/60 | HR 60 | Ht 67.5 in | Wt 140.6 lb

## 2016-10-31 DIAGNOSIS — R0602 Shortness of breath: Secondary | ICD-10-CM | POA: Diagnosis not present

## 2016-10-31 DIAGNOSIS — J208 Acute bronchitis due to other specified organisms: Secondary | ICD-10-CM | POA: Diagnosis not present

## 2016-10-31 DIAGNOSIS — J9611 Chronic respiratory failure with hypoxia: Secondary | ICD-10-CM | POA: Diagnosis not present

## 2016-10-31 DIAGNOSIS — J209 Acute bronchitis, unspecified: Secondary | ICD-10-CM | POA: Insufficient documentation

## 2016-10-31 DIAGNOSIS — I779 Disorder of arteries and arterioles, unspecified: Secondary | ICD-10-CM

## 2016-10-31 DIAGNOSIS — J9610 Chronic respiratory failure, unspecified whether with hypoxia or hypercapnia: Secondary | ICD-10-CM | POA: Insufficient documentation

## 2016-10-31 MED ORDER — PREDNISONE 10 MG PO TABS: 10.0000 mg | ORAL_TABLET | Freq: Every day | ORAL | 0 refills | Status: DC

## 2016-10-31 NOTE — Assessment & Plan Note (Signed)
Acute Bronchitis /URI  Check cxr   Plan  Patient Instructions  Chest x-ray today Finish Keflex as directed Increase prednisone 81m daily  Mucinex DM twice daily as needed for cough and congestion Saline nasal rinses as needed Saline nasal gel AYR As needed   Follow Dr. BLamonte Sakaias planned and as needed Please contact office for sooner follow up if symptoms do not improve or worsen or seek emergency care

## 2016-10-31 NOTE — Addendum Note (Signed)
Addended by: Melvenia Needles on: 10/31/2016 02:19 PM   Modules accepted: Orders

## 2016-10-31 NOTE — Progress Notes (Signed)
Subjective:    [name], female    DOB: 1951-01-23, 65 y.o.   MRN: [id number]  HPI 65 year old female never smoker followed for scleroderma, Raynaud, non-Hodgkin's lymphoma (treated in 2010) , hypertension, pulmonary artery hypertension.  TEST  Underwent RHC on 04/18/12 as below:  RA = 9  RV = 75/11/14  PA = 71/25 (44)  PCW = 4  Fick cardiac output/index = 3.0/1.7  Thermo CO/CI = 2.7/1.5  PVR = 23.5 [place]  FA sat = 79% on room air (checked 3 times). Sat with finger probe on air 94%  PA sat = 41%, 43%  MW 04/18/12 880 feet   10/31/2016 Acute OV  Pt presents for an acute office visit. Complains of cough, congestion , drainage , increased sob. Mainly mucus clear .  Took some Zycam with some relief.  She denies any chest pain, orthopnea, PND, increased leg swelling or hemoptysis..   Patient has secondary pulmonary hypertension. The setting of scleroderma. Remains on sildenafil  And Opsumit .   Patient underwent a right femoral endarterectomy and Dacron patch angioplasty on 09/14/2016 for severe venous stasis. Recent cellulitis on kefelx , 7/14 day . Leg is getting better. Says [place] was ok last week at [facility].  On Oxygen 2-3 l/m .    Past Medical History:  Diagnosis Date  . Anemia   . Arthritis   . DOE (dyspnea on exertion)   . Epistaxis   . History of chicken pox   . Hypertension   . Leg ulcer ([place])   . Melanoma ([place]) 1999  . 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[place])   . Non Hodgkin's lymphoma ([place])    Treated with chemo 2010, felt to be cured.  Marland Kitchen PAH (pulmonary artery hypertension)   . Peripheral arterial occlusive disease ([place])   . Pulmonary hypertension   . Requires supplemental oxygen    2 liters  . Sclerodermia generalized [facility])    Current Outpatient Prescriptions on File Prior to Visit  Medication Sig Dispense Refill  . acidophilus (RISAQUAD) CAPS Take 1 capsule by mouth every morning.     Marland Kitchen atorvastatin (LIPITOR) 10 MG tablet Take 1 tablet (10 mg  total) by mouth daily. 30 tablet 1  . BIOTIN PO Take 1 tablet by mouth daily.    . cephALEXin (KEFLEX) 500 MG capsule Take 1 capsule (500 mg total) by mouth 3 (three) times daily. 42 capsule 0  . cholecalciferol (VITAMIN D) 1000 UNITS tablet Take 2,000 Units by mouth every evening.     . citalopram (CELEXA) 20 MG tablet Take 2 tablets (40 mg total) by mouth daily. 180 tablet 1  . clopidogrel (PLAVIX) 75 MG tablet take 1 tablet by mouth once daily 30 tablet 5  . fish oil-omega-3 fatty acids 1000 MG capsule Take 1 g by mouth 2 (two) times daily.     Marland Kitchen gabapentin (NEURONTIN) 300 MG capsule Take 600 mg by mouth 2 (two) times daily.    . iron polysaccharides (POLY-IRON 150) 150 MG capsule Take 1 capsule (150 mg total) by mouth 2 (two) times daily. 60 capsule 5  . Macitentan (OPSUMIT) 10 MG TABS Take 10 mg by mouth daily. 30 tablet 1  . moxifloxacin (VIGAMOX) 0.5 % ophthalmic solution Place 1 drop into both eyes 3 (three) times daily as needed (allergies).     . multivitamin (THERAGRAN) per tablet Take 1 tablet by mouth daily.     . OXYGEN Inhale 2 L into the lungs continuous.    Marland Kitchen  pantoprazole (PROTONIX) 40 MG tablet Take 1 tablet (40 mg total) by mouth 2 (two) times daily. 60 tablet 1  . potassium chloride (K-DUR) 10 MEQ tablet take 1 tablet by mouth once daily 30 tablet 1  . predniSONE (DELTASONE) 5 MG tablet Take 5 mg by mouth daily with breakfast.    . sildenafil (REVATIO) 20 MG tablet Take 2 tablets (40 mg total) by mouth 3 (three) times daily. 180 tablet 6  . spironolactone (ALDACTONE) 25 MG tablet Take 1 tablet (25 mg total) by mouth daily. 30 tablet 1  . torsemide (DEMADEX) 20 MG tablet Take 2 tablets (40 mg total) by mouth 2 (two) times daily. 120 tablet 1  . vitamin B-12 (CYANOCOBALAMIN) 500 MCG tablet Take 500 mcg by mouth every morning.     Marland Kitchen SANTYL ointment APP TO RIGHT LEG ULCER EVERY OTHER DAY  3   No current facility-administered medications on file prior to visit.      Review of  Systems Constitutional:   No  weight loss, night sweats,  Fevers, chills, fatigue, or  lassitude.  HEENT:   No headaches,  Difficulty swallowing,  Tooth/dental problems, or  Sore throat,                No sneezing, itching, ear ache, + nasal congestion, post nasal drip,   CV:  No chest pain,  Orthopnea, PND, swelling in lower extremities, anasarca, dizziness, palpitations, syncope.   GI  No heartburn, indigestion, abdominal pain, nausea, vomiting, diarrhea, change in bowel habits, loss of appetite, bloody stools.   Resp: .  No chest wall deformity  Skin: no rash or lesions.  GU: no dysuria, change in color of urine, no urgency or frequency.  No flank pain, no hematuria   MS:  No joint pain or swelling.  No decreased range of motion.  No back pain.  Psych:  No change in mood or affect. No depression or anxiety.  No memory loss.         Objective:   Physical Exam Vitals:   10/31/16 1236  BP: 128/60  Pulse: 60  SpO2: 92%  Weight: 140 lb 9.6 oz (63.8 kg)  Height: 5' 7.5" (1.715 m)   GEN: A/Ox3; pleasant , NAD, elderly    HEENT:  [place]/AT,  EACs-clear, TMs-wnl, NOSE-clear drainage with turbinate edema , THROAT-clear, no lesions, no postnasal drip or exudate noted.   NECK:  Supple w/ fair ROM; no JVD; normal carotid impulses w/o bruits; no thyromegaly or nodules palpated; no lymphadenopathy.    RESP  Decreased BS in bases , no wheezing  no dullness to percussion  CARD:  RRR, no m/r/g  , tr -1 +peripheral edema, pulses intact, no cyanosis. + clubbing   GI:   Soft & nt; nml bowel sounds; no organomegaly or masses detected.   Musco: Warm bil,  Arthritic changes of the hands  Neuro: alert, no focal deficits noted.    Skin: Warm, no lesions or rashes  Harmonii Karle NP-C  [facility]  10/31/2016         Assessment & Plan:

## 2016-10-31 NOTE — Patient Instructions (Addendum)
Chest x-ray today Finish Keflex as directed Increase prednisone 21m daily  Mucinex DM twice daily as needed for cough and congestion Saline nasal rinses as needed Saline nasal gel AYR As needed   Follow Dr. BLamonte Sakaias planned and as needed Please contact office for sooner follow up if symptoms do not improve or worsen or seek emergency care

## 2016-10-31 NOTE — Assessment & Plan Note (Signed)
Compensated on O2  

## 2016-11-01 ENCOUNTER — Telehealth: Payer: Self-pay | Admitting: Adult Health

## 2016-11-01 NOTE — Progress Notes (Signed)
Called and spoke to pt. Informed her of the results and recs per TP. Pt verbalized understanding and denied any further questions or concerns at this time.

## 2016-11-01 NOTE — Telephone Encounter (Signed)
Called and spoke to pt. Informed her of the results and recs per TP. Pt verbalized understanding and denied any further questions or concerns at this time.    Notes Recorded by Melvenia Needles, NP on 11/01/2016 at 1:06 PM EDT No sign of PNA  Chronic changes  Cont w/ ov recs Please contact office for sooner follow up if symptoms do not improve or worsen or seek emergency care

## 2016-11-02 ENCOUNTER — Encounter: Payer: Self-pay | Admitting: Vascular Surgery

## 2016-11-06 ENCOUNTER — Ambulatory Visit (INDEPENDENT_AMBULATORY_CARE_PROVIDER_SITE_OTHER): Payer: Self-pay | Admitting: Vascular Surgery

## 2016-11-06 VITALS — BP 140/62 | HR 68 | Temp 98.0°F | Resp 20 | Ht 67.0 in | Wt 138.0 lb

## 2016-11-06 DIAGNOSIS — I739 Peripheral vascular disease, unspecified: Secondary | ICD-10-CM

## 2016-11-06 NOTE — Progress Notes (Signed)
Patient name: [name] [name] MRN: [id number] DOB: Aug 20, 1951 Sex: female  REASON FOR VISIT: Follow-up right femoral endarterectomy and Dacron patch angioplasty 09/14/2016  HPI: [name] [name] is a 65 y.o. female here for follow-up. She initially was doing quite well but then presented with erythema and swelling which was quite marked on her thigh down to her knee. She was seen by Dr. Oneida Alar who prescribed 2 weeks of Keflex. She had dramatic [name] [name] resolution of this.  Current Outpatient Prescriptions  Medication Sig Dispense Refill  . acidophilus (RISAQUAD) CAPS Take 1 capsule by mouth every morning.     Marland Kitchen atorvastatin (LIPITOR) 10 MG tablet Take 1 tablet (10 mg total) by mouth daily. 30 tablet 1  . BIOTIN PO Take 1 tablet by mouth daily.    . Cadexomer Iodine (IODOFLEX EX) Apply topically.    . cephALEXin (KEFLEX) 500 MG capsule Take 1 capsule (500 mg total) by mouth 3 (three) times daily. 42 capsule 0  . cholecalciferol (VITAMIN D) 1000 UNITS tablet Take 2,000 Units by mouth every evening.     . citalopram (CELEXA) 20 MG tablet Take 2 tablets (40 mg total) by mouth daily. 180 tablet 1  . clopidogrel (PLAVIX) 75 MG tablet take 1 tablet by mouth once daily 30 tablet 5  . fish oil-omega-3 fatty acids 1000 MG capsule Take 1 g by mouth 2 (two) times daily.     Marland Kitchen gabapentin (NEURONTIN) 300 MG capsule Take 600 mg by mouth 2 (two) times daily.    . iron polysaccharides (POLY-IRON 150) 150 MG capsule Take 1 capsule (150 mg total) by mouth 2 (two) times daily. 60 capsule 5  . Macitentan (OPSUMIT) 10 MG TABS Take 10 mg by mouth daily. 30 tablet 1  . moxifloxacin (VIGAMOX) 0.5 % ophthalmic solution Place 1 drop into both eyes 3 (three) times daily as needed (allergies).     . multivitamin (THERAGRAN) per tablet Take 1 tablet by mouth daily.     . OXYGEN Inhale 2 L into the lungs continuous.    . pantoprazole (PROTONIX) 40 MG tablet Take 1 tablet (40 mg total) by mouth  2 (two) times daily. 60 tablet 1  . potassium chloride (K-DUR) 10 MEQ tablet take 1 tablet by mouth once daily 30 tablet 1  . predniSONE (DELTASONE) 10 MG tablet Take 1 tablet (10 mg total) by mouth daily with breakfast. 7 tablet 0  . sildenafil (REVATIO) 20 MG tablet Take 2 tablets (40 mg total) by mouth 3 (three) times daily. 180 tablet 6  . spironolactone (ALDACTONE) 25 MG tablet Take 1 tablet (25 mg total) by mouth daily. 30 tablet 1  . torsemide (DEMADEX) 20 MG tablet Take 2 tablets (40 mg total) by mouth 2 (two) times daily. 120 tablet 1  . vitamin B-12 (CYANOCOBALAMIN) 500 MCG tablet Take 500 mcg by mouth every morning.     . predniSONE (DELTASONE) 5 MG tablet Take 5 mg by mouth daily with breakfast.     No current facility-administered medications for this visit.      PHYSICAL EXAM: Vitals:   11/06/16 1416  BP: 140/62  Pulse: 68  Resp: 20  Temp: 98 F (36.7 C)  TempSrc: Oral  SpO2: 93%  Weight: 138 lb (62.6 kg)  Height: _0  (1.702 m)    GENERAL: The patient is a well-nourished female, in no acute distress. The vital signs are documented above. Her incision is healing quite now there well and there is no remaining erythema  present. No fluctuance. She does have a palpable popliteal pulse. She does have her compression dressing for her of venous stasis ulceration but reports this is improving  MEDICAL ISSUES: Stable overall. Will be seen again in 2 months for continued ongoing follow-up with ankle arm indices at that time. She'll notify should she develop any recurrent evidence of erythema.   [name] Posner, MD FACS [facility] Tel [phone] Pager [phone]

## 2016-11-07 ENCOUNTER — Ambulatory Visit (HOSPITAL_COMMUNITY)
Admission: RE | Admit: 2016-11-07 | Discharge: 2016-11-07 | Disposition: A | Discharge status: Home / Self Care | Payer: BLUE CROSS/BLUE SHIELD | Source: Ambulatory Visit | Attending: Internal Medicine | Admitting: Internal Medicine

## 2016-11-07 DIAGNOSIS — C8589 Other specified types of non-Hodgkin lymphoma, extranodal and solid organ sites: Secondary | ICD-10-CM | POA: Insufficient documentation

## 2016-11-07 MED ORDER — GADOBENATE DIMEGLUMINE 529 MG/ML IV SOLN
15.0000 mL | Freq: Once | INTRAVENOUS | Status: AC | PRN
  Administered 2016-11-07: 12 mL via INTRAVENOUS

## 2016-11-08 ENCOUNTER — Encounter (HOSPITAL_BASED_OUTPATIENT_CLINIC_OR_DEPARTMENT_OTHER): Payer: BLUE CROSS/BLUE SHIELD | Attending: Internal Medicine

## 2016-11-08 DIAGNOSIS — L942 Calcinosis cutis: Secondary | ICD-10-CM | POA: Insufficient documentation

## 2016-11-08 DIAGNOSIS — L97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: Secondary | ICD-10-CM | POA: Diagnosis not present

## 2016-11-08 DIAGNOSIS — I87333 Chronic venous hypertension (idiopathic) with ulcer and inflammation of bilateral lower extremity: Secondary | ICD-10-CM | POA: Diagnosis not present

## 2016-11-08 DIAGNOSIS — L97821 Non-pressure chronic ulcer of other part of left lower leg limited to breakdown of skin: Secondary | ICD-10-CM | POA: Diagnosis not present

## 2016-11-09 NOTE — Addendum Note (Signed)
Addended by: Mena Goes on: 11/09/2016 12:56 PM   Modules accepted: Orders

## 2016-11-15 DIAGNOSIS — I87333 Chronic venous hypertension (idiopathic) with ulcer and inflammation of bilateral lower extremity: Secondary | ICD-10-CM | POA: Diagnosis not present

## 2016-11-29 DIAGNOSIS — I87333 Chronic venous hypertension (idiopathic) with ulcer and inflammation of bilateral lower extremity: Secondary | ICD-10-CM | POA: Diagnosis not present

## 2016-12-06 ENCOUNTER — Encounter (HOSPITAL_BASED_OUTPATIENT_CLINIC_OR_DEPARTMENT_OTHER): Payer: BLUE CROSS/BLUE SHIELD | Attending: Internal Medicine

## 2016-12-06 DIAGNOSIS — L97312 Non-pressure chronic ulcer of right ankle with fat layer exposed: Secondary | ICD-10-CM | POA: Diagnosis not present

## 2016-12-06 DIAGNOSIS — I87331 Chronic venous hypertension (idiopathic) with ulcer and inflammation of right lower extremity: Secondary | ICD-10-CM | POA: Diagnosis not present

## 2016-12-06 DIAGNOSIS — L942 Calcinosis cutis: Secondary | ICD-10-CM | POA: Insufficient documentation

## 2016-12-06 DIAGNOSIS — L97812 Non-pressure chronic ulcer of other part of right lower leg with fat layer exposed: Secondary | ICD-10-CM | POA: Insufficient documentation

## 2016-12-12 ENCOUNTER — Other Ambulatory Visit: Payer: Self-pay | Admitting: Internal Medicine

## 2016-12-13 DIAGNOSIS — I87331 Chronic venous hypertension (idiopathic) with ulcer and inflammation of right lower extremity: Secondary | ICD-10-CM | POA: Diagnosis not present

## 2016-12-15 ENCOUNTER — Other Ambulatory Visit: Payer: Self-pay | Admitting: Internal Medicine

## 2016-12-20 DIAGNOSIS — I87331 Chronic venous hypertension (idiopathic) with ulcer and inflammation of right lower extremity: Secondary | ICD-10-CM | POA: Diagnosis not present

## 2016-12-21 ENCOUNTER — Other Ambulatory Visit (HOSPITAL_COMMUNITY): Payer: Self-pay | Admitting: Internal Medicine

## 2016-12-26 ENCOUNTER — Other Ambulatory Visit: Payer: Self-pay | Admitting: Internal Medicine

## 2016-12-26 DIAGNOSIS — I779 Disorder of arteries and arterioles, unspecified: Secondary | ICD-10-CM

## 2016-12-27 DIAGNOSIS — I87331 Chronic venous hypertension (idiopathic) with ulcer and inflammation of right lower extremity: Secondary | ICD-10-CM | POA: Diagnosis not present

## 2017-01-03 ENCOUNTER — Encounter (HOSPITAL_BASED_OUTPATIENT_CLINIC_OR_DEPARTMENT_OTHER): Payer: BLUE CROSS/BLUE SHIELD | Attending: Internal Medicine

## 2017-01-03 DIAGNOSIS — L97812 Non-pressure chronic ulcer of other part of right lower leg with fat layer exposed: Secondary | ICD-10-CM | POA: Diagnosis not present

## 2017-01-03 DIAGNOSIS — I87331 Chronic venous hypertension (idiopathic) with ulcer and inflammation of right lower extremity: Secondary | ICD-10-CM | POA: Diagnosis present

## 2017-01-03 DIAGNOSIS — L97511 Non-pressure chronic ulcer of other part of right foot limited to breakdown of skin: Secondary | ICD-10-CM | POA: Diagnosis not present

## 2017-01-03 DIAGNOSIS — L97312 Non-pressure chronic ulcer of right ankle with fat layer exposed: Secondary | ICD-10-CM | POA: Insufficient documentation

## 2017-01-07 ENCOUNTER — Other Ambulatory Visit: Payer: Self-pay | Admitting: Dermatology

## 2017-01-10 DIAGNOSIS — I87331 Chronic venous hypertension (idiopathic) with ulcer and inflammation of right lower extremity: Secondary | ICD-10-CM | POA: Diagnosis not present

## 2017-01-15 ENCOUNTER — Ambulatory Visit: Payer: Medicare Other | Admitting: Vascular Surgery

## 2017-01-15 ENCOUNTER — Encounter (HOSPITAL_COMMUNITY): Payer: Medicare Other

## 2017-01-17 ENCOUNTER — Encounter (HOSPITAL_COMMUNITY): Payer: BLUE CROSS/BLUE SHIELD | Admitting: Internal Medicine

## 2017-01-17 ENCOUNTER — Encounter: Payer: Self-pay | Admitting: Vascular Surgery

## 2017-01-18 ENCOUNTER — Other Ambulatory Visit (HOSPITAL_COMMUNITY): Payer: Self-pay | Admitting: Internal Medicine

## 2017-01-21 ENCOUNTER — Other Ambulatory Visit (INDEPENDENT_AMBULATORY_CARE_PROVIDER_SITE_OTHER): Payer: BLUE CROSS/BLUE SHIELD

## 2017-01-21 DIAGNOSIS — I272 Pulmonary hypertension, unspecified: Secondary | ICD-10-CM | POA: Diagnosis not present

## 2017-01-21 LAB — COMPREHENSIVE METABOLIC PANEL
ALT: 12 U/L (ref 0–35)
AST: 20 U/L (ref 0–37)
Albumin: 4 g/dL (ref 3.5–5.2)
Alkaline Phosphatase: 90 U/L (ref 39–117)
BUN: 43 mg/dL — ABNORMAL HIGH (ref 6–23)
CO2: 31 mEq/L (ref 19–32)
Calcium: 9.5 mg/dL (ref 8.4–10.5)
Chloride: 96 mEq/L (ref 96–112)
Creatinine, Ser: 1.42 mg/dL — ABNORMAL HIGH (ref 0.40–1.20)
GFR: 39.18 mL/min — ABNORMAL LOW (ref >60.00)
Glucose, Bld: 100 mg/dL — ABNORMAL HIGH (ref 70–99)
Potassium: 4.3 mEq/L (ref 3.5–5.1)
Sodium: 136 mEq/L (ref 135–145)
Total Bilirubin: 0.4 mg/dL (ref 0.2–1.2)
Total Protein: 7.8 g/dL (ref 6.0–8.3)

## 2017-01-21 LAB — CBC
HCT: 34.3 % — ABNORMAL LOW (ref 36.0–46.0)
Hemoglobin: 11.4 g/dL — ABNORMAL LOW (ref 12.0–15.0)
MCHC: 33.2 g/dL (ref 30.0–36.0)
MCV: 93.8 fl (ref 78.0–100.0)
Platelets: 338 10*3/uL (ref 150.0–400.0)
RBC: 3.66 Mil/uL — ABNORMAL LOW (ref 3.87–5.11)
RDW: 16 % — ABNORMAL HIGH (ref 11.5–15.5)
WBC: 9.4 10*3/uL (ref 4.0–10.5)

## 2017-01-22 ENCOUNTER — Encounter: Payer: Self-pay | Admitting: Vascular Surgery

## 2017-01-22 ENCOUNTER — Ambulatory Visit (HOSPITAL_COMMUNITY)
Admission: RE | Admit: 2017-01-22 | Discharge: 2017-01-22 | Disposition: A | Discharge status: Home / Self Care | Payer: BLUE CROSS/BLUE SHIELD | Source: Ambulatory Visit | Attending: Vascular Surgery | Admitting: Vascular Surgery

## 2017-01-22 ENCOUNTER — Ambulatory Visit (INDEPENDENT_AMBULATORY_CARE_PROVIDER_SITE_OTHER): Payer: BLUE CROSS/BLUE SHIELD | Admitting: Vascular Surgery

## 2017-01-22 VITALS — BP 130/69 | HR 71 | Temp 97.8°F | Resp 18 | Ht 67.0 in | Wt 135.0 lb

## 2017-01-22 DIAGNOSIS — I10 Essential (primary) hypertension: Secondary | ICD-10-CM | POA: Insufficient documentation

## 2017-01-22 DIAGNOSIS — I83012 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: Secondary | ICD-10-CM

## 2017-01-22 DIAGNOSIS — L97219 Non-pressure chronic ulcer of right calf with unspecified severity: Secondary | ICD-10-CM

## 2017-01-22 DIAGNOSIS — I739 Peripheral vascular disease, unspecified: Secondary | ICD-10-CM

## 2017-01-22 DIAGNOSIS — Z95820 Peripheral vascular angioplasty status with implants and grafts: Secondary | ICD-10-CM | POA: Diagnosis not present

## 2017-01-22 NOTE — Progress Notes (Signed)
[facility] [place]  Patient name: [name] [name] MRN: [id number] DOB: March 02, 1951 Sex: female  REASON FOR VISIT: Follow-up right femoral endarterectomy and patch angioplasty 09/14/2016  HPI: [name] [name] is a 66 y.o. female here today for follow-up. She had both severe venous stasis ulceration of her right distal calf and also arterial insufficiency. She underwent right common femoral endarterectomy and Dacron patch angioplasty and is here for continued follow-up. She is completely healed her incision. She is being seen by the wound center and she has her multilayer impression dressing intact and is tells me that she is continuing to have improvement in this. She has less discomfort than prior related to the open ulceration as well.  Past Medical History:  Diagnosis Date  . Anemia   . Arthritis   . DOE (dyspnea on exertion)   . Epistaxis   . History of chicken pox   . Hypertension   . Leg ulcer ([place])   . Melanoma ([place]) 1999  . 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[place])   . Non Hodgkin's lymphoma ([place])    Treated with chemo 2010, felt to be cured.  Marland Kitchen PAH (pulmonary artery hypertension)   . Peripheral arterial occlusive disease ([place])   . Pulmonary hypertension   . Requires supplemental oxygen    2 liters  . Sclerodermia generalized ([place])     Family History  Problem Relation Age of Onset  . Lupus Father   . Lymphoma Father   . Hyperlipidemia Mother   . Cancer Daughter     sarcoma  . Colon cancer      SOCIAL HISTORY: Social History  Substance Use Topics  . Smoking status: Never Smoker  . Smokeless tobacco: Never Used  . Alcohol use 0.0 oz/week     Comment: Occasional    Allergies  Allergen Reactions  . Sulfa Antibiotics Rash    Severe rash with significant peeling of face. No airway involvement per patient  . Levaquin [Levofloxacin In D5w] Other (See Comments)    FLU LIKE SYMPTOM  . Penicillins Rash    Has patient  had a PCN reaction causing immediate rash, facial/tongue/throat swelling, SOB or lightheadedness with hypotension: no Has patient had a PCN reaction causing severe rash involving mucus membranes or skin necrosis:no Has patient had a PCN reaction that required hospitalization no Has patient had a PCN reaction occurring within the last 10 years: {no If all of the above answers are "NO", then may proceed with Cephalosporin use.    Current Outpatient Prescriptions  Medication Sig Dispense Refill  . Acetaminophen (TYLENOL ARTHRITIS PAIN PO) Take by mouth.    Marland Kitchen acidophilus (RISAQUAD) CAPS Take 1 capsule by mouth every morning.     Marland Kitchen atorvastatin (LIPITOR) 10 MG tablet take 1 tablet by mouth once daily 30 tablet 1  . BIOTIN PO Take 1 tablet by mouth daily.    . Cadexomer Iodine (IODOFLEX EX) Apply topically.    . cholecalciferol (VITAMIN D) 1000 UNITS tablet Take 2,000 Units by mouth every evening.     . citalopram (CELEXA) 20 MG tablet Take 2 tablets (40 mg total) by mouth daily. 180 tablet 1  . clopidogrel (PLAVIX) 75 MG tablet take 1 tablet by mouth once daily RESUME IN ONE WEEK 30 tablet 1  . fish oil-omega-3 fatty acids 1000 MG capsule Take 1 g by mouth 2 (two) times daily.     Marland Kitchen gabapentin (NEURONTIN) 300 MG capsule Take 600 mg by mouth 2 (two) times  daily.    . iron polysaccharides (POLY-IRON 150) 150 MG capsule Take 1 capsule (150 mg total) by mouth 2 (two) times daily. 60 capsule 5  . Macitentan (OPSUMIT) 10 MG TABS Take 10 mg by mouth daily. 30 tablet 1  . moxifloxacin (VIGAMOX) 0.5 % ophthalmic solution Place 1 drop into both eyes 3 (three) times daily as needed (allergies).     . multivitamin (THERAGRAN) per tablet Take 1 tablet by mouth daily.     . OXYGEN Inhale 2 L into the lungs continuous.    . pantoprazole (PROTONIX) 40 MG tablet Take 1 tablet (40 mg total) by mouth 2 (two) times daily. 60 tablet 1  . potassium chloride (K-DUR) 10 MEQ tablet take 1 tablet by mouth once daily 30  tablet 1  . predniSONE (DELTASONE) 5 MG tablet take 1 tablet by mouth once daily WITH BREAKFAST 30 tablet 1  . sildenafil (REVATIO) 20 MG tablet TAKE 2 TABLETS BY MOUTH THREE TIMES A DAY 180 tablet 2  . spironolactone (ALDACTONE) 25 MG tablet Take 1 tablet (25 mg total) by mouth daily. 30 tablet 1  . torsemide (DEMADEX) 20 MG tablet take 2 tablets by mouth twice a day 120 tablet 1  . vitamin B-12 (CYANOCOBALAMIN) 500 MCG tablet Take 500 mcg by mouth every morning.     . cephALEXin (KEFLEX) 500 MG capsule Take 1 capsule (500 mg total) by mouth 3 (three) times daily. (Patient not taking: Reported on 01/22/2017) 42 capsule 0  . clopidogrel (PLAVIX) 75 MG tablet take 1 tablet by mouth once daily (Patient not taking: Reported on 01/22/2017) 30 tablet 5  . predniSONE (DELTASONE) 10 MG tablet Take 1 tablet (10 mg total) by mouth daily with breakfast. (Patient not taking: Reported on 01/22/2017) 7 tablet 0  . predniSONE (DELTASONE) 5 MG tablet Take 5 mg by mouth daily with breakfast.     No current facility-administered medications for this visit.     REVIEW OF SYSTEMS:  _0  denotes positive finding, _1  denotes negative finding Cardiac  Comments:  Chest pain or chest pressure:    Shortness of breath upon exertion: x   Short of breath when lying flat:    Irregular heart rhythm:        Vascular    Pain in calf, thigh, or hip brought on by ambulation:    Pain in feet at night that wakes you up from your sleep:     Blood clot in your veins:    Leg swelling:           PHYSICAL EXAM: Vitals:   01/22/17 1613  BP: 130/69  Pulse: 71  Resp: 18  Temp: 97.8 F (36.6 C)  TempSrc: Oral  SpO2: 93%  Weight: 135 lb (61.2 kg)  Height: _2  (1.702 m)    GENERAL: The patient is a well-nourished female, in no acute distress. The vital signs are documented above. CARDIOVASCULAR: Her right groin incision is completely healed with a 2-3+ femoral pulse. She has an easily palpable right popliteal pulse.  She does have swelling on her foot with a compression dressing intact. I do not feel pedal pulses. PULMONARY: There is good air exchange  MUSCULOSKELETAL: There are no major deformities or cyanosis. NEUROLOGIC: No focal weakness or paresthesias are detected. SKIN: There are no ulcers or rashes noted. PSYCHIATRIC: The patient has a normal affect.  DATA:  Noninvasive studies today reveal normal ankle arm index bilaterally with multiphasic waveforms bilaterally  MEDICAL ISSUES: Excellent result  from her endarterectomy with normal flow to her lower extremity from the arterial standpoint. Severe venous stasis disease. Has had ablation of her saphenous vein for correction of her superficial venous reflux. Does have known deep venous reflux as well. We'll continue her local wound care and the wound center and see [place] again on an as-needed basis    [name], MD [facility] Tel [phone] Pager [phone]

## 2017-01-23 ENCOUNTER — Encounter: Payer: Self-pay | Admitting: Emergency Medicine

## 2017-01-23 ENCOUNTER — Ambulatory Visit (INDEPENDENT_AMBULATORY_CARE_PROVIDER_SITE_OTHER): Payer: BLUE CROSS/BLUE SHIELD | Admitting: Emergency Medicine

## 2017-01-23 ENCOUNTER — Telehealth: Payer: Self-pay | Admitting: Emergency Medicine

## 2017-01-23 DIAGNOSIS — J301 Allergic rhinitis due to pollen: Secondary | ICD-10-CM

## 2017-01-23 DIAGNOSIS — I2729 Other secondary pulmonary hypertension: Secondary | ICD-10-CM | POA: Diagnosis not present

## 2017-01-23 DIAGNOSIS — J208 Acute bronchitis due to other specified organisms: Secondary | ICD-10-CM

## 2017-01-23 MED ORDER — AZELASTINE HCL 0.1 % NA SOLN: NASAL | 2 refills | Status: DC

## 2017-01-23 MED ORDER — DOXYCYCLINE HYCLATE 100 MG PO TABS
100.0000 mg | ORAL_TABLET | Freq: Two times a day (BID) | ORAL | 0 refills | Status: DC
Start: 2017-01-23 — End: 2017-02-25

## 2017-01-23 NOTE — Patient Instructions (Signed)
Please take astelin nasal spray, 2 sprays each nostril 2-3 times a day as needed for congestion and drainage Take doxycycline 156m twice a day until completely gone.  Continue your oxygen and other medications as you are using them  Follow with Dr BLamonte Sakaiin Spring 2018, sooner if you have any problems.

## 2017-01-23 NOTE — Telephone Encounter (Signed)
Spoke with pt. States that she can't take Doxy tablets. She can tolerate Doxy capsules. I have called the pharmacy and changed the prescription. Nothing further was needed.

## 2017-01-23 NOTE — Progress Notes (Signed)
Subjective:    [name], female    DOB: [birth year], 66 y.o.   MRN: [id number] HPI 66 yo woman, never smoker, hx of scleroderma, Raynaud's, non-Hodgkins lymphoma (treated '10), HTN, PAH originally dx in [place] and treated, but meds d/c'd when she improved. Has been seen in by Dr [name], had R heart cath as below in 03/2012. She has been restarted on therapy, seen both in [place] and at [facility] >> Tracleer + Revatio + Veletri (since 2/'14). She has felt worse on IV therapy, progressive as it titrated up.  Dr Gilles Chiquito scaled back her veletri for this reason. She has had progressive dyspnea - was just started prednisone 20m qd. She has benefited significantly, better exertional tolerance and SpO2.    Underwent RHC on 04/18/12 as below:  RA = 9  RV = 75/11/14  PA = 71/25 (44)  PCW = 4  Fick cardiac output/index = 3.0/1.7  Thermo CO/CI = 2.7/1.5  PVR = 23.5 Woods  FA sat = 79% on room air (checked 3 times). Sat with finger probe on air 94%  PA sat = 41%, 43%  MW 04/18/12 880 feet  ROV 06/24/13 -- f/u for scleroderma, Raynaud's, non-Hodgkins lymphoma (treated '10), HTN, PAH on IV therapy. 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 Aspirating).  She states that she has felt much better with good functional capacity, less dyspnea, no CP since she started prednisone. No overt aspiration sx although she is high risk. [facility]   ROV 07/24/13 -- scleroderma, Raynaud's, non-Hodgkins lymphoma (treated '10), HTN, PAH on IV therapy. Last time we initiated a pred taper, currently on 254m She has noticed a bit of lightheadedness. She saw Dr FoGilles Chiquitoarly July - her O2 was decreased to 4L/min w exertion. Her former maintenance pred dose was 5m16md.   ROV 08/27/13 -- scleroderma, Raynaud's, non-Hodgkins lymphoma (treated '10), HTN, PAH on IV therapy. We have been tapering pred, currently at 72m72md. She has been interested in getting off Veletri. We have planned to repeat R heart cath when we get to stable pred dosing. Breathing is stable. Continues to have occasional Reynaud's, not really increased freq.   ROV 10/08/13 -- scleroderma, Raynaud's, non-Hodgkins lymphoma (treated '10), HTN, PAH on IV therapy. We have been tapering pred, last time decreased to 5mg.72me appears to have tolerated this. No new issues with breathing, CP. Edema is stable. No syncope or pre-syncope.   ROV 01/06/14 -- scleroderma, Raynaud's, non-Hodgkins lymphoma (treated '10), HTN, PAH previously on IV therapy >> [facility]. Current dose is 6 puffs qid. Also on bosentan and sildenifil. She has also been treated for a MRSA R leg ulcer with linezolid, planned for 10 days. She had to stop celexa when the linezolid caused QTc prolongation. She is on Pred 5mg, 65mble scleroderma dose. She saw Dr BeekmaAmil Amenlanning to follow with him.   Breathing is doing well. Doesn't feel that she lost much ground since she changed from IV therapy. No real edema. No syncope or presyncope. No HA's or visual changes.   ROV 01/04/15 -- follow up visit for scleroderma, secondary PAH. We had changed her Veletri to Tyvaso. She has since been taken off Tyvaso. She hasn't seemed to lose any ground off this. Remains on bosentan and tracleer. Also on chronic  prednisone 68m. She wonders about starting cellcept and getting off the prednisone. She also wonders about a substitute for bosentan.   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 She has been on bosentan and sildenafil; [facility] doesn[facility]t notice any real change. We dropped her pred from 5 to 252mand she has noted some severe fatigue 2h before its time to take the next dose. We did the decrease because she has a poorly healing wound.  She does  not desaturate.        ROV 11/01/15 -- Follow-up visit for history of autoimmune disease and associated secondary pulmonary hypertension. She is on macetentan, slidenifil. She is on prednisone 10m41mShe was admited recently for symptomatic anemia, required transfusion. EGD and CSY were reassuring, received PRBC and staretd on Fe. She feels better, stronger. TTE per CHF clinic plans.   ROV 05/02/16 -- patient with a history of scleroderma on low-dose prednisone and secondary primary hypertension. Also with a history of non-Hodgkin's lymphoma that has been in remission since 66 She had an echocardiogram on 05/01/16 and have reviewed the results. This showed a stable left ventricular ejection fraction, mild flattening of the intermittent radicular septum and a moderately dilated RV with decreased systolic function. Her PA systolic pressure was estimated at 75 mmHg. Her macitentan was continued sildenafil was changed to Adcirca 68m71mily (not yet available from the pharmacy).   She has been overall stable, although she has been dealing with the stress of her husband being sick. She tells me that she has increased her home O2 to 3L/min, is on 2L/min pulsed on POC. She remains on pred 68.10mg,61mcreased 6 months ago by Dr BeekmAmil Amene has been dealing with symptomatic anemia without evidence GIB  ROV 08/21/16 -- Patient follows up today for her history of secondary pulmonary hypertension in the setting of scleroderma. As of our last visit she was on macetentan and was about to change from sildenafil to [employer] made the switch but did not tolerate so she is back to sildenifil 21mg 210m She's also been maintained on prednisone, current dose is 68mg da50m. She is followed by Dr EdwardsOletta Lamasand Dr Early for her vascular disease. She is to have angiography next month to evaluate LE's. Last TTE was May 2017.   Acute OV 01/23/17 -- this is an acute office visit. Patient has a history of secondary pulmonary hypertension  in setting of scleroderma. Has been on sildenifil and and macetentan, prednisone 10mg. Sh78mas treated for bronchitis 10/31/16 with brief increase in pred to 10mg, mu14mx. She is now having more head congestion, associated with cough esp at night. She is coughing up clear to white mucous. No fever.    Recent Labs Lab 01/21/17 1246  HGB 11.4*  HCT 34.3*  WBC 9.4  PLT 338.0      Review of Systems  Constitutional: Negative for appetite change, fever and unexpected weight change.  HENT: Negative for congestion, dental problem, ear pain, nosebleeds, postnasal drip, rhinorrhea, sinus pressure, sneezing, sore throat and trouble swallowing.   Eyes: Negative for redness and itching.  Respiratory: Positive for shortness of breath. Negative for cough, chest tightness and wheezing.   Cardiovascular: Negative for palpitations and leg swelling.  Gastrointestinal: Negative for nausea and vomiting.  Genitourinary: Negative for dysuria.  Musculoskeletal: Negative for joint swelling.  Skin: Negative for rash.  Neurological: Negative for headaches.  Hematological:  Does not bruise/bleed easily.  Psychiatric/Behavioral: Negative for dysphoric mood. The patient is not nervous/anxious.        Objective:   Physical Exam Vitals:   01/23/17 1134  BP: 118/70  BP Location: Left Arm  Patient Position: Sitting  Cuff Size: Normal  Pulse: 67  SpO2: 96%  Weight: 148 lb (67.1 kg)  Height: _0  (1.702 m)   Gen: Pleasant, thin, in no distress,  normal affect on pulsed nasal cannula O2  ENT: No lesions,  mouth clear,  oropharynx clear, no postnasal drip  Neck: No JVD, no TMG, no carotid bruits  Lungs: No use of accessory muscles, few bibasilar insp crackles  Cardiovascular: RRR, heart sounds normal, no murmur or gallops, trace peripheral edema  Musculoskeletal: No deformities, no cyanosis or clubbing  Neuro: alert, non focal  Skin: significant changes from scleroderma on arms, hands, fingers;  right lower extremity wound is wrapped and dressed      Assessment & Plan:  Pulmonary hypertension associated with systemic disorder Continue current medical regimen and oxygen. A prescription was written so that she can get oxygen on her flight [employer]. Following Spring 2018. We will determine when she needs a repeat echocardiogram at that time.  Acute bronchitis Doxycycline 127m bid x 7 days.   Allergic rhinitis Appears to be driving cough, driving recurrent bronchitis. Will try adding astelin nasal spray  RBaltazar Apo MD, PhD 01/23/2017, 12:07 PM [facility] [phone] if no answer [phone]

## 2017-01-23 NOTE — Assessment & Plan Note (Signed)
Appears to be driving cough, driving recurrent bronchitis. Will try adding astelin nasal spray

## 2017-01-23 NOTE — Assessment & Plan Note (Signed)
Doxycycline 126m bid x 7 days.

## 2017-01-23 NOTE — Assessment & Plan Note (Signed)
Continue current medical regimen and oxygen. A prescription was written so that she can get oxygen on her flight [employer]. Following Spring 2018. We will determine when she needs a repeat echocardiogram at that time.

## 2017-01-24 DIAGNOSIS — I87331 Chronic venous hypertension (idiopathic) with ulcer and inflammation of right lower extremity: Secondary | ICD-10-CM | POA: Diagnosis not present

## 2017-01-25 ENCOUNTER — Encounter: Payer: Self-pay | Admitting: Internal Medicine

## 2017-01-25 ENCOUNTER — Other Ambulatory Visit: Payer: Self-pay | Admitting: Internal Medicine

## 2017-01-29 ENCOUNTER — Other Ambulatory Visit (HOSPITAL_COMMUNITY): Payer: Self-pay | Admitting: *Deleted

## 2017-02-07 ENCOUNTER — Encounter (HOSPITAL_BASED_OUTPATIENT_CLINIC_OR_DEPARTMENT_OTHER): Payer: BLUE CROSS/BLUE SHIELD | Attending: Internal Medicine

## 2017-02-07 DIAGNOSIS — L97812 Non-pressure chronic ulcer of other part of right lower leg with fat layer exposed: Secondary | ICD-10-CM | POA: Diagnosis not present

## 2017-02-07 DIAGNOSIS — L942 Calcinosis cutis: Secondary | ICD-10-CM | POA: Diagnosis not present

## 2017-02-07 DIAGNOSIS — L97312 Non-pressure chronic ulcer of right ankle with fat layer exposed: Secondary | ICD-10-CM | POA: Insufficient documentation

## 2017-02-07 DIAGNOSIS — I87331 Chronic venous hypertension (idiopathic) with ulcer and inflammation of right lower extremity: Secondary | ICD-10-CM | POA: Diagnosis present

## 2017-02-07 DIAGNOSIS — L97511 Non-pressure chronic ulcer of other part of right foot limited to breakdown of skin: Secondary | ICD-10-CM | POA: Insufficient documentation

## 2017-02-11 DIAGNOSIS — I87331 Chronic venous hypertension (idiopathic) with ulcer and inflammation of right lower extremity: Secondary | ICD-10-CM | POA: Diagnosis not present

## 2017-02-12 ENCOUNTER — Other Ambulatory Visit: Payer: Self-pay | Admitting: Internal Medicine

## 2017-02-14 ENCOUNTER — Ambulatory Visit (HOSPITAL_COMMUNITY)
Admission: RE | Admit: 2017-02-14 | Discharge: 2017-02-14 | Disposition: A | Discharge status: Home / Self Care | Payer: BLUE CROSS/BLUE SHIELD | Source: Ambulatory Visit | Attending: Internal Medicine | Admitting: Internal Medicine

## 2017-02-14 DIAGNOSIS — J849 Interstitial pulmonary disease, unspecified: Secondary | ICD-10-CM | POA: Diagnosis not present

## 2017-02-14 DIAGNOSIS — I2729 Other secondary pulmonary hypertension: Secondary | ICD-10-CM | POA: Diagnosis not present

## 2017-02-14 DIAGNOSIS — Z882 Allergy status to sulfonamides status: Secondary | ICD-10-CM | POA: Diagnosis not present

## 2017-02-14 DIAGNOSIS — Z7902 Long term (current) use of antithrombotics/antiplatelets: Secondary | ICD-10-CM | POA: Insufficient documentation

## 2017-02-14 DIAGNOSIS — Z8572 Personal history of non-Hodgkin lymphomas: Secondary | ICD-10-CM | POA: Insufficient documentation

## 2017-02-14 DIAGNOSIS — M349 Systemic sclerosis, unspecified: Secondary | ICD-10-CM | POA: Insufficient documentation

## 2017-02-14 DIAGNOSIS — I739 Peripheral vascular disease, unspecified: Secondary | ICD-10-CM | POA: Diagnosis not present

## 2017-02-14 DIAGNOSIS — I2721 Secondary pulmonary arterial hypertension: Secondary | ICD-10-CM | POA: Insufficient documentation

## 2017-02-14 DIAGNOSIS — Z881 Allergy status to other antibiotic agents status: Secondary | ICD-10-CM | POA: Diagnosis not present

## 2017-02-14 DIAGNOSIS — Z9221 Personal history of antineoplastic chemotherapy: Secondary | ICD-10-CM | POA: Diagnosis not present

## 2017-02-14 DIAGNOSIS — Z9981 Dependence on supplemental oxygen: Secondary | ICD-10-CM | POA: Diagnosis not present

## 2017-02-14 DIAGNOSIS — Z7982 Long term (current) use of aspirin: Secondary | ICD-10-CM | POA: Insufficient documentation

## 2017-02-14 DIAGNOSIS — Z8619 Personal history of other infectious and parasitic diseases: Secondary | ICD-10-CM | POA: Diagnosis not present

## 2017-02-14 DIAGNOSIS — Z85820 Personal history of malignant melanoma of skin: Secondary | ICD-10-CM | POA: Insufficient documentation

## 2017-02-14 DIAGNOSIS — Z880 Allergy status to penicillin: Secondary | ICD-10-CM | POA: Insufficient documentation

## 2017-02-14 NOTE — Progress Notes (Signed)
PULMONARY HYPERTENSION CLINIC  [name], female   DOB: [birth year], 66 y.o.   MRN: [id number] [name], female   DOB: 06/03/1951, 66 y.o.   MRN: [id number] Primary Cardiologist: Dr. [name] Pulmonologist: Dr. Lamonte Sakai  HPI: [name] is a 66 y.o. 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 We saw her initially in 2013.   RHC 04/18/12  RA = 9  RV = 75/11/14  PA = 71/25 (44)  PCW = 4  Fick cardiac output/index = 3.0/1.7  Thermo CO/CI = 2.7/1.5  PVR = 13.3 Woods  FA sat = 79% on room air (checked 3 times). Sat with finger probe on air 94%  PA sat = 41%, 43%   6 MW 04/18/12 880 feet 6 MW 11/10/12 650 feet. Limited by legs. Sats down to 85% on 5L (sats at rest 95%)  6 MW 3/15 915 feet (279 m) 6 MW 2/16 930 feet (283 m)  Echo 10/13 LV EF 65-70%. RV severely enlarged/HK severe TR. RVSP 41mHg Echo (10/15) with EF 60-65%, D-shaped interventricular septum, moderately dilated RV with moderately decreased systolic function, PA systolic pressure 72 mmHg, IVC not dilated.  Echo (5/17) with EF 65-70%, Mildly flattened septum, severely dilated RV with moderately decreased systolic function, PA systolic pressure 68 mmHg, IVC not dilated.   Repeat RHC 11/14/12  RA = 14  RV = 84/8/20  PA = 83/29 (50)  PCW = 10  Fick cardiac output/index = 4.5/2.5  Thermo CO/CI = 3.8/2.2  PVR = 8.9 Woods (Fick)  FA sat = 86% on 3L  PA sat = 55%, 57  [facility] on IV epoprostenol (Veletri) in 01/2013. She had intolerable side effects with Veletri.  [facility] Ve[facility] wean and Tyvaso initiation.  She was intolerant of 12 breaths of Tyvaso but able to tolerate 6 breaths 3 times a day. Stopped Tyvaso in 2015. In February 2016 switched bosentan to Macitentan. Now on Macitentan 10 and  sildenafil 40 tid.   Here for f/u: Taking Macitentan 10 daily and sildenafil 40 tid (did not tolerate 60 tid or Adcirca). Overall feels like she is symptomatically better. Main limitation is peripheral neuropathy.  She is able to walk around in stores with her oxygen without significant problem, uses 2-3L O2. She has chronic lower extremity ulcers  followed at the wound clinic. Also had PCI of right LE to help with healing. No syncope or significant edema. Sats 94-95 with ear probe at home    ABI 4/15: R 0.86 L 1.0     Labs (3/15): K 3.9, creatinine 1.0, LFTs normal Labs (05/17/14): K 3.4 Cr 1.1 Bun 40 LFTs ok  Labs (6/15): K 4.3, creatinine 1.15 Labs (8/15): creatinine 1.4 Labs (10/15): LFTs normal Labs (2/16): LFTs normal  Review of Systems: All pertinent positives and negatives as in HPI, otherwise negative.   Past Medical History  Diagnosis Date  . History of chicken pox   . Hypertension   . Sclerodermia generalized ([facility]   . Non Hodgkin's lymphoma ([place]     Treated with chemo 2010, felt to be cured.  . Melanoma ([facility] 1999  . Peripheral arterial occlusive disease ([place]   . 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[place]   . DOE (dyspnea on exertion)   . PAH (pulmonary artery hypertension) ([place]   .  Epistaxis   . Pulmonary hypertension (HCC)     Current Outpatient Prescriptions  Medication Sig Dispense Refill  . acidophilus (RISAQUAD) CAPS Take 1 capsule by mouth every morning.     Marland Kitchen aspirin 81 MG tablet Take 1 tablet (81 mg total) by mouth every evening. Resume in 1 week. 30 tablet   . atorvastatin (LIPITOR) 10 MG tablet Take 1 tablet (10 mg total) by mouth daily. 30 tablet 1  . BIOTIN PO Take 1 tablet by mouth daily.    . cholecalciferol (VITAMIN D) 1000 UNITS tablet Take 2,000 Units by mouth every evening.     . citalopram (CELEXA) 20 MG tablet Take 30 mg by mouth daily.    . clopidogrel (PLAVIX) 75 MG tablet take 1 tablet by mouth once daily 30 tablet 5  . fish oil-omega-3 fatty acids  1000 MG capsule Take 1 g by mouth daily with lunch.     . gabapentin (NEURONTIN) 300 MG capsule take 1 capsule by mouth every morning and 2 every evening 180 capsule 1  . Macitentan (OPSUMIT) 10 MG TABS Take 10 mg by mouth daily. 30 tablet 1  . moxifloxacin (VIGAMOX) 0.5 % ophthalmic solution Place 1 drop into both eyes 3 (three) times daily as needed (allergies).     . multivitamin (THERAGRAN) per tablet Take 2 tablets by mouth 2 (two) times daily.     . OXYGEN Inhale 2 L into the lungs continuous.    . pantoprazole (PROTONIX) 40 MG tablet Take 1 tablet (40 mg total) by mouth daily. 30 tablet 1  . POLY-IRON 150 150 MG capsule take 1 capsule by mouth twice a day 60 capsule 2  . potassium chloride (K-DUR) 10 MEQ tablet Take 1 tablet (10 mEq total) by mouth daily. 30 tablet 1  . predniSONE (DELTASONE) 5 MG tablet take 1 tablet by mouth once daily WITH BREAKFAST (Patient taking differently: Take 2.5 mg by mouth. take 1 tablet by mouth once daily WITH BREAKFAST) 30 tablet 1  . sildenafil (REVATIO) 20 MG tablet Take 2 tablets (40 mg total) by mouth 3 (three) times daily. 180 tablet 1  . spironolactone (ALDACTONE) 25 MG tablet Take 1 tablet (25 mg total) by mouth daily. 30 tablet 1  . torsemide (DEMADEX) 20 MG tablet Take 2 tablets (40 mg total) by mouth 2 (two) times daily. 120 tablet 1  . vitamin B-12 (CYANOCOBALAMIN) 500 MCG tablet Take 500 mcg by mouth every morning.      No current facility-administered medications for this encounter.    Allergies  Allergen Reactions  . Sulfa Antibiotics     Severe rash with significant peeling of face. No airway involvement per patient  . Levaquin [Levofloxacin In D5w] Other (See Comments)    Other reaction(s): Other (See Comments) FLU LIKE SYMPTOM  . Penicillins Rash     Filed Vitals:   05/01/16 1357  BP: 138/70  Pulse: 75  Weight: 135 lb (61.236 kg)    PHYSICAL EXAM: General: No acute distress No respiratory difficulty HEENT: normal Neck:  supple. JVP 5-6. Carotids 2+ bilat; no bruits. No lymphadenopathy or thryomegaly appreciated. Cor: PMI nondisplaced. Regular rate & rhythm. Prominent P2. No murmur. + RV lift Lungs: clear on 4 liters [place] Abdomen: soft, nontender, nondistended. No hepatosplenomegaly. No bruits or masses. Good bowel sounds. 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 + gouty nodules. LEs wrapped due to ulcers. No significant edema.  Neuro: alert & oriented x 3, cranial nerves grossly intact. moves  all 4 extremities w/o difficulty. Affect pleasant.  ASSESSMENT & PLAN:  1. 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  Unable to tolerate IV epoprostenol and with no improvement on Tyvaso.  She stopped Tyvaso and 6 minute walkshowed no deterioration.  She remains on Revatio and macitentan.  WHO class II-III dyspnea.  Last echo showed moderately dilated and moderately dysfunctional RV with persistent severe pulmonary hypertension.   - We discussed options     1. Stay put     2. Repeat RHC and if worse add new med     3. Consider increasing therapy without repeat RHC.            --increase sildenafil to 60 tid           --switch sildenafil to Adempas           --or add Uptravi  - She is not volume overloaded on exam.  Continue current torsemide. .  - Continue home oxygen.  2. Scleroderma: Followed by Dr. Amil Amen. 3. Chronic L Ulceration: Followed by Wound Care.   4. PAD: Follows with [facility]. 5. Interstitial lung disease: In setting of scleroderma.  She is on home oxygen and followed by pulmonary.   Loxley Schmale,MD 05/01/2016

## 2017-02-14 NOTE — Patient Instructions (Signed)
Call to set up heart cath. [phone]  Follow up 3-4 months with Dr. [name].  Do the following things EVERYDAY: 1) Weigh yourself in the morning before breakfast. Write it down and keep it in a log. 2) Take your medicines as prescribed 3) Eat low salt foods-Limit salt (sodium) to 2000 mg per day.  4) Stay as active as you can everyday 5) Limit all fluids for the day to less than 2 liters

## 2017-02-14 NOTE — Addendum Note (Signed)
Encounter addended by: Effie Berkshire, RN on: 02/14/2017  4:24 PM<BR>    Actions taken: Sign clinical note

## 2017-02-18 ENCOUNTER — Telehealth (HOSPITAL_COMMUNITY): Payer: Self-pay | Admitting: Vascular Surgery

## 2017-02-18 DIAGNOSIS — I87331 Chronic venous hypertension (idiopathic) with ulcer and inflammation of right lower extremity: Secondary | ICD-10-CM | POA: Diagnosis not present

## 2017-02-18 NOTE — Telephone Encounter (Signed)
Pt called to speak to [facility], pt was told to call back to schedule Heart Cath.. Please advise

## 2017-02-19 ENCOUNTER — Other Ambulatory Visit (HOSPITAL_COMMUNITY): Payer: Self-pay | Admitting: *Deleted

## 2017-02-19 DIAGNOSIS — I272 Pulmonary hypertension, unspecified: Secondary | ICD-10-CM

## 2017-02-19 NOTE — Telephone Encounter (Signed)
Spoke w/pt, sch RHC for 3/2 at 10:30, pt aware and instructions reviewed via phone

## 2017-02-25 DIAGNOSIS — I87331 Chronic venous hypertension (idiopathic) with ulcer and inflammation of right lower extremity: Secondary | ICD-10-CM | POA: Diagnosis not present

## 2017-02-27 ENCOUNTER — Other Ambulatory Visit: Payer: Self-pay | Admitting: Internal Medicine

## 2017-02-27 DIAGNOSIS — I779 Disorder of arteries and arterioles, unspecified: Secondary | ICD-10-CM

## 2017-03-01 ENCOUNTER — Ambulatory Visit (HOSPITAL_COMMUNITY)
Admission: RE | Admit: 2017-03-01 | Payer: BLUE CROSS/BLUE SHIELD | Source: Ambulatory Visit | Admitting: Internal Medicine

## 2017-03-01 ENCOUNTER — Encounter (HOSPITAL_COMMUNITY): Admission: RE | Payer: Self-pay | Source: Ambulatory Visit

## 2017-03-01 SURGERY — RIGHT HEART CATH
Anesthesia: LOCAL

## 2017-03-04 ENCOUNTER — Encounter (HOSPITAL_BASED_OUTPATIENT_CLINIC_OR_DEPARTMENT_OTHER): Payer: BLUE CROSS/BLUE SHIELD | Attending: Internal Medicine

## 2017-03-04 DIAGNOSIS — L97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: Secondary | ICD-10-CM | POA: Diagnosis not present

## 2017-03-04 DIAGNOSIS — L97811 Non-pressure chronic ulcer of other part of right lower leg limited to breakdown of skin: Secondary | ICD-10-CM | POA: Insufficient documentation

## 2017-03-04 DIAGNOSIS — I87331 Chronic venous hypertension (idiopathic) with ulcer and inflammation of right lower extremity: Secondary | ICD-10-CM | POA: Diagnosis not present

## 2017-03-04 DIAGNOSIS — I83222 Varicose veins of left lower extremity with both ulcer of calf and inflammation: Secondary | ICD-10-CM | POA: Insufficient documentation

## 2017-03-04 DIAGNOSIS — L942 Calcinosis cutis: Secondary | ICD-10-CM | POA: Insufficient documentation

## 2017-03-07 ENCOUNTER — Other Ambulatory Visit: Payer: Self-pay | Admitting: Internal Medicine

## 2017-03-11 DIAGNOSIS — I83222 Varicose veins of left lower extremity with both ulcer of calf and inflammation: Secondary | ICD-10-CM | POA: Diagnosis not present

## 2017-03-14 ENCOUNTER — Telehealth: Payer: Self-pay | Admitting: Internal Medicine

## 2017-03-14 ENCOUNTER — Other Ambulatory Visit: Payer: BLUE CROSS/BLUE SHIELD

## 2017-03-14 NOTE — Telephone Encounter (Signed)
These standing orders are already in place.

## 2017-03-14 NOTE — Telephone Encounter (Signed)
Patient came in office during lunch, she states she should of had lab orders in. She went to lab no orders. She has an appointment on April 2. She asked if we could put the orders in she said its all the normal stuff. Could you please call patient once orders are in so she can come back by, Thank you.

## 2017-03-14 NOTE — Telephone Encounter (Signed)
Patient contacted and made aware that orders are in system and to come back at her convince

## 2017-03-15 ENCOUNTER — Other Ambulatory Visit (INDEPENDENT_AMBULATORY_CARE_PROVIDER_SITE_OTHER): Payer: BLUE CROSS/BLUE SHIELD

## 2017-03-15 DIAGNOSIS — I272 Pulmonary hypertension, unspecified: Secondary | ICD-10-CM | POA: Diagnosis not present

## 2017-03-15 LAB — COMPREHENSIVE METABOLIC PANEL
ALT: 14 U/L (ref 0–35)
AST: 24 U/L (ref 0–37)
Albumin: 4.2 g/dL (ref 3.5–5.2)
Alkaline Phosphatase: 70 U/L (ref 39–117)
BUN: 34 mg/dL — ABNORMAL HIGH (ref 6–23)
CO2: 30 mEq/L (ref 19–32)
Calcium: 9.6 mg/dL (ref 8.4–10.5)
Chloride: 94 mEq/L — ABNORMAL LOW (ref 96–112)
Creatinine, Ser: 1.26 mg/dL — ABNORMAL HIGH (ref 0.40–1.20)
GFR: 44.95 mL/min — ABNORMAL LOW (ref >60.00)
Glucose, Bld: 104 mg/dL — ABNORMAL HIGH (ref 70–99)
Potassium: 3.9 mEq/L (ref 3.5–5.1)
Sodium: 132 mEq/L — ABNORMAL LOW (ref 135–145)
Total Bilirubin: 0.5 mg/dL (ref 0.2–1.2)
Total Protein: 7.7 g/dL (ref 6.0–8.3)

## 2017-03-15 LAB — CBC
HCT: 35.4 % — ABNORMAL LOW (ref 36.0–46.0)
Hemoglobin: 11.7 g/dL — ABNORMAL LOW (ref 12.0–15.0)
MCHC: 33 g/dL (ref 30.0–36.0)
MCV: 96.1 fl (ref 78.0–100.0)
Platelets: 320 10*3/uL (ref 150.0–400.0)
RBC: 3.68 Mil/uL — ABNORMAL LOW (ref 3.87–5.11)
RDW: 14.4 % (ref 11.5–15.5)
WBC: 8 10*3/uL (ref 4.0–10.5)

## 2017-03-18 DIAGNOSIS — I83222 Varicose veins of left lower extremity with both ulcer of calf and inflammation: Secondary | ICD-10-CM | POA: Diagnosis not present

## 2017-03-21 ENCOUNTER — Telehealth (HOSPITAL_COMMUNITY): Payer: Self-pay | Admitting: *Deleted

## 2017-03-21 NOTE — Telephone Encounter (Signed)
Patient called to reschedule her Right Heart Cath with Dr. [name].  First available date patient could do was April 20th at [place] scheduled and patient aware.

## 2017-03-25 DIAGNOSIS — I83222 Varicose veins of left lower extremity with both ulcer of calf and inflammation: Secondary | ICD-10-CM | POA: Diagnosis not present

## 2017-03-26 ENCOUNTER — Other Ambulatory Visit (HOSPITAL_COMMUNITY): Payer: Self-pay | Admitting: Pharmacist

## 2017-03-26 DIAGNOSIS — I2729 Other secondary pulmonary hypertension: Secondary | ICD-10-CM

## 2017-03-26 MED ORDER — MACITENTAN 10 MG PO TABS: 10.0000 mg | ORAL_TABLET | Freq: Every day | ORAL | 3 refills | Status: DC

## 2017-03-28 ENCOUNTER — Other Ambulatory Visit: Payer: Self-pay | Admitting: Internal Medicine

## 2017-04-01 ENCOUNTER — Encounter (HOSPITAL_BASED_OUTPATIENT_CLINIC_OR_DEPARTMENT_OTHER): Payer: BLUE CROSS/BLUE SHIELD | Attending: Internal Medicine

## 2017-04-01 ENCOUNTER — Ambulatory Visit: Payer: Medicare Other | Admitting: Internal Medicine

## 2017-04-01 DIAGNOSIS — I7300 Raynaud's syndrome without gangrene: Secondary | ICD-10-CM | POA: Diagnosis not present

## 2017-04-01 DIAGNOSIS — L97812 Non-pressure chronic ulcer of other part of right lower leg with fat layer exposed: Secondary | ICD-10-CM | POA: Insufficient documentation

## 2017-04-01 DIAGNOSIS — I87331 Chronic venous hypertension (idiopathic) with ulcer and inflammation of right lower extremity: Secondary | ICD-10-CM | POA: Insufficient documentation

## 2017-04-01 DIAGNOSIS — I739 Peripheral vascular disease, unspecified: Secondary | ICD-10-CM | POA: Insufficient documentation

## 2017-04-01 DIAGNOSIS — I10 Essential (primary) hypertension: Secondary | ICD-10-CM | POA: Insufficient documentation

## 2017-04-01 DIAGNOSIS — L97312 Non-pressure chronic ulcer of right ankle with fat layer exposed: Secondary | ICD-10-CM | POA: Diagnosis not present

## 2017-04-01 DIAGNOSIS — M069 Rheumatoid arthritis, unspecified: Secondary | ICD-10-CM | POA: Diagnosis not present

## 2017-04-01 DIAGNOSIS — L942 Calcinosis cutis: Secondary | ICD-10-CM | POA: Insufficient documentation

## 2017-04-03 ENCOUNTER — Other Ambulatory Visit (HOSPITAL_COMMUNITY): Payer: Self-pay | Admitting: Internal Medicine

## 2017-04-05 ENCOUNTER — Other Ambulatory Visit: Payer: Self-pay | Admitting: Internal Medicine

## 2017-04-08 DIAGNOSIS — I87331 Chronic venous hypertension (idiopathic) with ulcer and inflammation of right lower extremity: Secondary | ICD-10-CM | POA: Diagnosis not present

## 2017-04-09 ENCOUNTER — Encounter: Payer: Self-pay | Admitting: Internal Medicine

## 2017-04-09 ENCOUNTER — Ambulatory Visit (INDEPENDENT_AMBULATORY_CARE_PROVIDER_SITE_OTHER): Payer: BLUE CROSS/BLUE SHIELD | Admitting: Internal Medicine

## 2017-04-09 DIAGNOSIS — I779 Disorder of arteries and arterioles, unspecified: Secondary | ICD-10-CM

## 2017-04-09 DIAGNOSIS — F329 Major depressive disorder, single episode, unspecified: Secondary | ICD-10-CM | POA: Diagnosis not present

## 2017-04-09 DIAGNOSIS — N183 Chronic kidney disease, stage 3 unspecified: Secondary | ICD-10-CM

## 2017-04-09 NOTE — Progress Notes (Signed)
   Subjective:    [name], female    DOB: 05/06/1951, 66 y.o.   MRN: [id number]  HPI The patient is a 66 YO female coming in for follow up of her depression (doing much better since her husband's health has improved, taking celexa 20 mg daily, no side effects, feeling well) and her CKD stage 3 (gets monitoring monthly with labs due to high risk medications for progression, BP at goal, no diabetes, last labs stable and discussed with her at visit). Getting right heart cath to check pulmonary pressures in the next month with cardiology.   Review of Systems  Constitutional: Negative.   Respiratory: Positive for shortness of breath. Negative for cough, chest tightness and wheezing.   Cardiovascular: Negative for chest pain, palpitations and leg swelling.  Gastrointestinal: Negative for abdominal distention, abdominal pain, constipation and diarrhea.  Musculoskeletal: Positive for arthralgias and myalgias. Negative for back pain, gait problem and joint swelling.  Skin: Positive for wound. Negative for color change, pallor and rash.       Seeing wound clinic, stable and healing  Neurological: Negative.       Objective:   Physical Exam  Constitutional: She is oriented to person, place, and time. She appears well-developed and well-nourished.  HENT:  Head: Normocephalic and atraumatic.  Eyes: EOM are normal.  Cardiovascular: Normal rate and regular rhythm.   Pulmonary/Chest: Effort normal. No respiratory distress. She has no wheezes.  On oxygen chronically  Abdominal: Soft.  Neurological: She is alert and oriented to person, place, and time. Coordination normal.  Skin: Skin is warm and dry.  Psychiatric: She has a normal mood and affect.   Vitals:   04/09/17 1316  BP: 138/60  Pulse: 71  Resp: 14  Temp: 97.9 F (36.6 C)  TempSrc: Oral  Weight: 143 lb (64.9 kg)  Height: _0  (1.702 m)      Assessment & Plan:

## 2017-04-09 NOTE — Progress Notes (Signed)
Pre visit review using our clinic review tool, if applicable. No additional management support is needed unless otherwise documented below in the visit note.

## 2017-04-09 NOTE — Patient Instructions (Signed)
You can come back next week for labs.

## 2017-04-10 NOTE — Assessment & Plan Note (Signed)
Checking labs in about 1 week and she will return for those. BP at goal and no diabetes. She is on high risk medications and needs frequent monitoring of renal function due to those medications.

## 2017-04-10 NOTE — Assessment & Plan Note (Signed)
Doing well on her celexa 20 mg daily. Symptoms are stable and improved with her husband's improved health. No change to regimen today.

## 2017-04-14 ENCOUNTER — Other Ambulatory Visit: Payer: Self-pay | Admitting: Internal Medicine

## 2017-04-15 DIAGNOSIS — I87331 Chronic venous hypertension (idiopathic) with ulcer and inflammation of right lower extremity: Secondary | ICD-10-CM | POA: Diagnosis not present

## 2017-04-17 ENCOUNTER — Other Ambulatory Visit: Payer: Self-pay | Admitting: Internal Medicine

## 2017-04-19 ENCOUNTER — Ambulatory Visit (HOSPITAL_COMMUNITY)
Admission: RE | Admit: 2017-04-19 | Discharge: 2017-04-19 | Disposition: A | Discharge status: Home / Self Care | Payer: BLUE CROSS/BLUE SHIELD | Source: Ambulatory Visit | Attending: Internal Medicine | Admitting: Internal Medicine

## 2017-04-19 ENCOUNTER — Encounter (HOSPITAL_COMMUNITY)
Admission: RE | Disposition: A | Discharge status: Home / Self Care | Payer: Self-pay | Source: Ambulatory Visit | Attending: Internal Medicine

## 2017-04-19 ENCOUNTER — Encounter (HOSPITAL_COMMUNITY): Payer: Self-pay | Admitting: Internal Medicine

## 2017-04-19 DIAGNOSIS — Z7982 Long term (current) use of aspirin: Secondary | ICD-10-CM | POA: Diagnosis not present

## 2017-04-19 DIAGNOSIS — M3481 Systemic sclerosis with lung involvement: Secondary | ICD-10-CM | POA: Diagnosis not present

## 2017-04-19 DIAGNOSIS — G629 Polyneuropathy, unspecified: Secondary | ICD-10-CM | POA: Insufficient documentation

## 2017-04-19 DIAGNOSIS — Z8572 Personal history of non-Hodgkin lymphomas: Secondary | ICD-10-CM | POA: Insufficient documentation

## 2017-04-19 DIAGNOSIS — Z7902 Long term (current) use of antithrombotics/antiplatelets: Secondary | ICD-10-CM | POA: Diagnosis not present

## 2017-04-19 DIAGNOSIS — Z9981 Dependence on supplemental oxygen: Secondary | ICD-10-CM | POA: Insufficient documentation

## 2017-04-19 DIAGNOSIS — Z7952 Long term (current) use of systemic steroids: Secondary | ICD-10-CM | POA: Diagnosis not present

## 2017-04-19 DIAGNOSIS — Z880 Allergy status to penicillin: Secondary | ICD-10-CM | POA: Diagnosis not present

## 2017-04-19 DIAGNOSIS — I10 Essential (primary) hypertension: Secondary | ICD-10-CM | POA: Insufficient documentation

## 2017-04-19 DIAGNOSIS — Z9221 Personal history of antineoplastic chemotherapy: Secondary | ICD-10-CM | POA: Diagnosis not present

## 2017-04-19 DIAGNOSIS — I739 Peripheral vascular disease, unspecified: Secondary | ICD-10-CM | POA: Diagnosis not present

## 2017-04-19 DIAGNOSIS — Z882 Allergy status to sulfonamides status: Secondary | ICD-10-CM | POA: Diagnosis not present

## 2017-04-19 DIAGNOSIS — J849 Interstitial pulmonary disease, unspecified: Secondary | ICD-10-CM | POA: Diagnosis not present

## 2017-04-19 DIAGNOSIS — I2721 Secondary pulmonary arterial hypertension: Secondary | ICD-10-CM | POA: Diagnosis present

## 2017-04-19 HISTORY — PX: RIGHT HEART CATH: CATH118263

## 2017-04-19 LAB — POCT I-STAT 3, VENOUS BLOOD GAS (G3P V)
Acid-Base Excess: 4 mmol/L — ABNORMAL HIGH (ref 0.0–2.0)
Acid-Base Excess: 5 mmol/L — ABNORMAL HIGH (ref 0.0–2.0)
Bicarbonate: 29.3 mmol/L — ABNORMAL HIGH (ref 20.0–28.0)
Bicarbonate: 30.3 mmol/L — ABNORMAL HIGH (ref 20.0–28.0)
O2 Saturation: 59 %
O2 Saturation: 62 %
TCO2: 31 mmol/L (ref 0–100)
TCO2: 32 mmol/L (ref 0–100)
pCO2, Ven: 44.3 mmHg (ref 44.0–60.0)
pCO2, Ven: 45.9 mmHg (ref 44.0–60.0)
pH, Ven: 7.428 (ref 7.250–7.430)
pH, Ven: 7.428 (ref 7.250–7.430)
pO2, Ven: 31 mmHg — CL (ref 32.0–45.0)
pO2, Ven: 32 mmHg (ref 32.0–45.0)

## 2017-04-19 LAB — BASIC METABOLIC PANEL
Anion gap: 13 (ref 5–15)
BUN: 41 mg/dL — ABNORMAL HIGH (ref 6–20)
CO2: 27 mmol/L (ref 22–32)
Calcium: 9.3 mg/dL (ref 8.9–10.3)
Chloride: 98 mmol/L — ABNORMAL LOW (ref 101–111)
Creatinine, Ser: 1.26 mg/dL — ABNORMAL HIGH (ref 0.44–1.00)
GFR calc Af Amer: 50 mL/min — ABNORMAL LOW (ref >60)
GFR calc non Af Amer: 43 mL/min — ABNORMAL LOW (ref >60)
Glucose, Bld: 89 mg/dL (ref 65–99)
Potassium: 3.6 mmol/L (ref 3.5–5.1)
Sodium: 138 mmol/L (ref 135–145)

## 2017-04-19 LAB — CBC
HCT: 40.8 % (ref 36.0–46.0)
Hemoglobin: 12.9 g/dL (ref 12.0–15.0)
MCH: 31.2 pg (ref 26.0–34.0)
MCHC: 31.6 g/dL (ref 30.0–36.0)
MCV: 98.8 fL (ref 78.0–100.0)
Platelets: 270 10*3/uL (ref 150–400)
RBC: 4.13 MIL/uL (ref 3.87–5.11)
RDW: 14.5 % (ref 11.5–15.5)
WBC: 6 10*3/uL (ref 4.0–10.5)

## 2017-04-19 LAB — PROTIME-INR
INR: 0.87
Prothrombin Time: 11.8 seconds (ref 11.4–15.2)

## 2017-04-19 SURGERY — RIGHT HEART CATH
Anesthesia: LOCAL

## 2017-04-19 MED ORDER — LIDOCAINE HCL 1 % IJ SOLN
INTRAMUSCULAR | Status: AC
  Filled 2017-04-19: qty 20

## 2017-04-19 MED ORDER — SODIUM CHLORIDE 0.9% FLUSH: 3.0000 mL | INTRAVENOUS | Status: DC | PRN

## 2017-04-19 MED ORDER — ASPIRIN 81 MG PO CHEW
81.0000 mg | CHEWABLE_TABLET | ORAL | Status: AC
  Administered 2017-04-19: 81 mg via ORAL

## 2017-04-19 MED ORDER — LIDOCAINE HCL (PF) 1 % IJ SOLN
INTRAMUSCULAR | Status: DC | PRN
  Administered 2017-04-19: 1 mL via INTRADERMAL

## 2017-04-19 MED ORDER — SODIUM CHLORIDE 0.9 % IV SOLN: 250.0000 mL | INTRAVENOUS | Status: DC | PRN

## 2017-04-19 MED ORDER — ASPIRIN 81 MG PO CHEW
CHEWABLE_TABLET | ORAL | Status: AC
  Filled 2017-04-19: qty 1

## 2017-04-19 MED ORDER — SODIUM CHLORIDE 0.9 % IV SOLN
INTRAVENOUS | Status: DC
  Administered 2017-04-19: 08:00:00 via INTRAVENOUS

## 2017-04-19 MED ORDER — HEPARIN (PORCINE) IN NACL 2-0.9 UNIT/ML-% IJ SOLN
INTRAMUSCULAR | Status: DC | PRN
  Administered 2017-04-19: 1000 mL

## 2017-04-19 MED ORDER — SODIUM CHLORIDE 0.9% FLUSH: 3.0000 mL | Freq: Two times a day (BID) | INTRAVENOUS | Status: DC

## 2017-04-19 MED ORDER — MIDAZOLAM HCL 2 MG/2ML IJ SOLN
INTRAMUSCULAR | Status: DC | PRN
  Administered 2017-04-19: 1 mg via INTRAVENOUS

## 2017-04-19 MED ORDER — HEPARIN (PORCINE) IN NACL 2-0.9 UNIT/ML-% IJ SOLN
INTRAMUSCULAR | Status: AC
  Filled 2017-04-19: qty 1000

## 2017-04-19 MED ORDER — FENTANYL CITRATE (PF) 100 MCG/2ML IJ SOLN
INTRAMUSCULAR | Status: AC
  Filled 2017-04-19: qty 2

## 2017-04-19 MED ORDER — ONDANSETRON HCL 4 MG/2ML IJ SOLN: 4.0000 mg | Freq: Four times a day (QID) | INTRAMUSCULAR | Status: DC | PRN

## 2017-04-19 MED ORDER — MIDAZOLAM HCL 2 MG/2ML IJ SOLN
INTRAMUSCULAR | Status: AC
  Filled 2017-04-19: qty 2

## 2017-04-19 MED ORDER — FENTANYL CITRATE (PF) 100 MCG/2ML IJ SOLN
INTRAMUSCULAR | Status: DC | PRN
  Administered 2017-04-19: 25 ug via INTRAVENOUS

## 2017-04-19 MED ORDER — ACETAMINOPHEN 325 MG PO TABS: 650.0000 mg | ORAL_TABLET | ORAL | Status: DC | PRN

## 2017-04-19 SURGICAL SUPPLY — 9 items
CATH BALLN WEDGE 5F 110CM (CATHETERS) ×2 IMPLANT
GUIDEWIRE .025 260CM (WIRE) ×2 IMPLANT
PACK CARDIAC CATHETERIZATION (CUSTOM PROCEDURE TRAY) ×2 IMPLANT
PROTECTION STATION PRESSURIZED (MISCELLANEOUS) ×2
SHEATH GLIDE SLENDER 4/5FR (SHEATH) ×2 IMPLANT
STATION PROTECTION PRESSURIZED (MISCELLANEOUS) ×1 IMPLANT
TRANSDUCER W/STOPCOCK (MISCELLANEOUS) ×2 IMPLANT
TUBING ART PRESS 72  MALE/FEM (TUBING) ×1
TUBING ART PRESS 72 MALE/FEM (TUBING) ×1 IMPLANT

## 2017-04-19 NOTE — Discharge Instructions (Signed)
Angiogram, Care After Pt to start new research medication Sexlexipag per Pts husband This sheet gives you information about how to care for yourself after your procedure. Your health care provider may also give you more specific instructions. If you have problems or questions, contact your health care provider. What can I expect after the procedure? After the procedure, it is common to have bruising and tenderness at the catheter insertion area. Follow these instructions at home: Insertion site care   Follow instructions from your health care provider about how to take care of your insertion site. Make sure you:  Wash your hands with soap and water before you change your bandage (dressing). If soap and water are not available, use hand sanitizer.  Change your dressing as told by your health care provider.  Leave stitches (sutures), skin glue, or adhesive strips in place. These skin closures may need to stay in place for 2 weeks or longer. If adhesive strip edges start to loosen and curl up, you may trim the loose edges. Do not remove adhesive strips completely unless your health care provider tells you to do that.  Do not take baths, swim, or use a hot tub until your health care provider approves.  You may shower 24-48 hours after the procedure or as told by your health care provider.  Gently wash the site with plain soap and water.  Pat the area dry with a clean towel.  Do not rub the site. This may cause bleeding.  Do not apply powder or lotion to the site. Keep the site clean and dry.  Check your insertion site every day for signs of infection. Check for:  Redness, swelling, or pain.  Fluid or blood.  Warmth.  Pus or a bad smell. Activity   Rest as told by your health care provider, usually for 1-2 days.  Do not lift anything that is heavier than 10 lbs. (4.5 kg) or as told by your health care provider.  Do not drive for 24 hours if you were given a medicine to help you  relax (sedative).  Do not drive or use heavy machinery while taking prescription pain medicine. General instructions   Return to your normal activities as told by your health care provider, usually in about a week. Ask your health care provider what activities are safe for you.  If the catheter site starts bleeding, lie flat and put pressure on the site. If the bleeding does not stop, get help right away. This is a medical emergency.  Drink enough fluid to keep your urine clear or pale yellow. This helps flush the contrast dye from your body.  Take over-the-counter and prescription medicines only as told by your health care provider.  Keep all follow-up visits as told by your health care provider. This is important. Contact a health care provider if:  You have a fever or chills.  You have redness, swelling, or pain around your insertion site.  You have fluid or blood coming from your insertion site.  The insertion site feels warm to the touch.  You have pus or a bad smell coming from your insertion site.  You have bruising around the insertion site.  You notice blood collecting in the tissue around the catheter site (hematoma). The hematoma may be painful to the touch. Get help right away if:  You have severe pain at the catheter insertion area.  The catheter insertion area swells very fast.  The catheter insertion area is bleeding, and the  bleeding does not stop when you hold steady pressure on the area.  The area near or just beyond the catheter insertion site becomes pale, cool, tingly, or numb. These symptoms may represent a serious problem that is an emergency. Do not wait to see if the symptoms will go away. Get medical help right away. Call your local emergency services (911 in the [place] Do not drive yourself to the hospital. Summary  After the procedure, it is common to have bruising and tenderness at the catheter insertion area.  After the procedure, it is  important to rest and drink plenty of fluids.  Do not take baths, swim, or use a hot tub until your health care provider says it is okay to do so. You may shower 24-48 hours after the procedure or as told by your health care provider.  If the catheter site starts bleeding, lie flat and put pressure on the site. If the bleeding does not stop, get help right away. This is a medical emergency. This information is not intended to replace advice given to you by your health care provider. Make sure you discuss any questions you have with your health care provider. Document Released: 07/05/2005 Document Revised: 11/21/2016 Document Reviewed: 11/21/2016 Elsevier Interactive Patient Education  2017 [employer].

## 2017-04-19 NOTE — Progress Notes (Signed)
Pt's husband states patient can be included in a clinical study with the use of new drug Selexipag.

## 2017-04-19 NOTE — Interval H&P Note (Signed)
History and Physical Interval Note:  04/19/2017 8:45 AM  [name] [name]  has presented today for surgery, with the diagnosis of pulmonary hypertension  The various methods of treatment have been discussed with the patient and family. After consideration of risks, benefits and other options for treatment, the patient has consented to  Procedure(s): Right Heart Cath (N/A) as a surgical intervention .  The patient's history has been reviewed, patient examined, no change in status, stable for surgery.  I have reviewed the patient's chart and labs.  Questions were answered to the patient's satisfaction.     Bensimhon, Quillian Quince

## 2017-04-19 NOTE — H&P (Signed)
PULMONARY HYPERTENSION CLINIC  Patient ID: ROSHAN SALAMON, female   DOB: 19-Jan-1951, 66 y.o.   MRN: [id number] Patient ID: CHANISE HABECK, female   DOB: 29-Jul-1951, 66 y.o.   MRN: [id number] Primary Cardiologist: Dr. [name] Pulmonologist: Dr. Lamonte Sakai  HPI: Ms. Menefee is a 66 y.o. 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 We saw her initially in 2013.   6 MW 04/18/12 880 feet 6 MW 11/10/12 650 feet. Limited by legs. Sats down to 85% on 5L (sats at rest 95%)  6 MW 3/15 915 feet (279 m) 6 MW 2/16 930 feet (283 m)  Echo 10/13 LV EF 65-70%. RV severely enlarged/HK severe TR. RVSP 70mHg Echo (10/15) with EF 60-65%, D-shaped interventricular septum, moderately dilated RV with moderately decreased systolic function, PA systolic pressure 72 mmHg, IVC not dilated.  Echo (5/17) with EF 65-70%, Mildly flattened septum, severely dilated RV with moderately decreased systolic function, PA systolic pressure 68 mmHg, IVC not dilated.   [place]  RA = 14  RV = 84/8/20  PA = 83/29 (50)  PCW = 10  Fick cardiac output/index = 4.5/2.5  Thermo CO/CI = 3.8/2.2  PVR = 8.9 Woods (Fick)  FA sat = 86% on 3L  PA sat = 55%, 57  [facility] F[facility]illes Chiquitoand started on IV epoprostenol (Veletri) in 01/2013. She had intolerable side effects with Veletri.  [facility] Veletri wean and Tyvaso initiation.  She was intolerant of 12 breaths of Tyvaso but able to tolerate 6 breaths 3 times a day. Stopped Tyvaso in 2015. In February 2016 switched bosentan to Macitentan. Now on Macitentan 10 and sildenafil 40 tid.   We saw her last in 2/18. Was taking Macitentan 10 daily and sildenafil 40 tid (did not tolerate 60 tid or Adcirca). Overall feels like she is symptomatically better. Main limitation is peripheral neuropathy.  She is able to walk  around in stores with her oxygen without significant problem, uses 2-3L O2. She has chronic lower extremity ulcers  followed at the wound clinic. Also had PCI of right LE to help with healing. No syncope or significant edema. Sats 94-95 with ear probe at home  6MW low but stable at 2815mHere for f/u RHC to assess PAH and possible need for selexipag.    ABI 4/15: R 0.86 L 1.0     Labs (3/15): K 3.9, creatinine 1.0, LFTs normal Labs (05/17/14): K 3.4 Cr 1.1 Bun 40 LFTs ok  Labs (6/15): K 4.3, creatinine 1.15 Labs (8/15): creatinine 1.4 Labs (10/15): LFTs normal Labs (2/16): LFTs normal  Review of Systems: All pertinent positives and negatives as in HPI, otherwise negative.        Past Medical History  Diagnosis Date  . History of chicken pox   . Hypertension   . Sclerodermia generalized (HCIrr[place]  . Non Hodgkin's lymphoma (HCCedar[place]    Treated with chemo 2010, felt to be cured.  . Melanoma (HCD[place]  . Peripheral arterial occlusive disease (HCW[place]  . 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HCS[place]  . DOE (dyspnea on exertion)   . PAH (pulmonary artery hypertension) (HCElk [place]  . Epistaxis   . Pulmonary hypertension (HCBoy[place]          Current Outpatient Prescriptions  Medication Sig Dispense Refill  . acidophilus (RISAQUAD)  CAPS Take 1 capsule by mouth every morning.     Marland Kitchen aspirin 81 MG tablet Take 1 tablet (81 mg total) by mouth every evening. Resume in 1 week. 30 tablet   . atorvastatin (LIPITOR) 10 MG tablet Take 1 tablet (10 mg total) by mouth daily. 30 tablet 1  . BIOTIN PO Take 1 tablet by mouth daily.    . cholecalciferol (VITAMIN D) 1000 UNITS tablet Take 2,000 Units by mouth every evening.     . citalopram (CELEXA) 20 MG tablet Take 30 mg by mouth daily.    . clopidogrel (PLAVIX) 75 MG tablet take 1 tablet by mouth once daily 30 tablet 5  . fish oil-omega-3 fatty acids 1000 MG capsule Take 1 g by mouth daily with lunch.     . gabapentin (NEURONTIN) 300 MG capsule  take 1 capsule by mouth every morning and 2 every evening 180 capsule 1  . Macitentan (OPSUMIT) 10 MG TABS Take 10 mg by mouth daily. 30 tablet 1  . moxifloxacin (VIGAMOX) 0.5 % ophthalmic solution Place 1 drop into both eyes 3 (three) times daily as needed (allergies).     . multivitamin (THERAGRAN) per tablet Take 2 tablets by mouth 2 (two) times daily.     . OXYGEN Inhale 2 L into the lungs continuous.    . pantoprazole (PROTONIX) 40 MG tablet Take 1 tablet (40 mg total) by mouth daily. 30 tablet 1  . POLY-IRON 150 150 MG capsule take 1 capsule by mouth twice a day 60 capsule 2  . potassium chloride (K-DUR) 10 MEQ tablet Take 1 tablet (10 mEq total) by mouth daily. 30 tablet 1  . predniSONE (DELTASONE) 5 MG tablet take 1 tablet by mouth once daily WITH BREAKFAST (Patient taking differently: Take 2.5 mg by mouth. take 1 tablet by mouth once daily WITH BREAKFAST) 30 tablet 1  . sildenafil (REVATIO) 20 MG tablet Take 2 tablets (40 mg total) by mouth 3 (three) times daily. 180 tablet 1  . spironolactone (ALDACTONE) 25 MG tablet Take 1 tablet (25 mg total) by mouth daily. 30 tablet 1  . torsemide (DEMADEX) 20 MG tablet Take 2 tablets (40 mg total) by mouth 2 (two) times daily. 120 tablet 1  . vitamin B-12 (CYANOCOBALAMIN) 500 MCG tablet Take 500 mcg by mouth every morning.      No current facility-administered medications for this encounter.         Allergies  Allergen Reactions  . Sulfa Antibiotics     Severe rash with significant peeling of face. No airway involvement per patient  . Levaquin [Levofloxacin In D5w] Other (See Comments)    Other reaction(s): Other (See Comments) FLU LIKE SYMPTOM  . Penicillins Rash        Filed Vitals:   05/01/16 1357  BP: 138/70  Pulse: 75  Weight: 135 lb (61.236 kg)    PHYSICAL EXAM: General: No acute distress No respiratory difficulty HEENT: normal Neck: supple. JVP 6-7 . Carotids 2+ bilat; no bruits. No lymphadenopathy  or thryomegaly appreciated. Cor: PMI nondisplaced. Regular rate & rhythm. Prominent P2. No murmur. + RV lift Lungs: clear on 4 liters [place] Abdomen: soft, nontender, nondistended. No hepatosplenomegaly. No bruits or masses. Good bowel sounds. 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 + gouty nodules. LEs wrapped due to ulcers. No significant edema.  Neuro: alert & oriented x 3, cranial nerves grossly intact. moves all 4 extremities w/o difficulty. Affect pleasant.  ASSESSMENT & PLAN:  1. PAH:  Group 1, related to scleroderma.  Unable to tolerate IV epoprostenol and with no improvement on Tyvaso.  She stopped Tyvaso and 6 minute walkshowed no deterioration.  She remains on Revatio and macitentan.  WHO class II-III dyspnea.  Last echo showed moderately dilated and moderately dysfunctional RV with persistent severe pulmonary hypertension.   - She is not volume overloaded on exam.  Continue current torsemide. .  - Continue home oxygen.  - Repeat RHC today to assess for persistent PAH and need for selexipag 2. Scleroderma: Followed by Dr. Amil Amen. 3. Chronic L Ulceration: Followed by Wound Care.   4. PAD: Follows with [facility]. 5. Interstitial lung disease: In setting of scleroderma.  She is on home oxygen and followed by pulmonary.   Ayvah Caroll,MD 05/01/2016

## 2017-04-22 DIAGNOSIS — I87331 Chronic venous hypertension (idiopathic) with ulcer and inflammation of right lower extremity: Secondary | ICD-10-CM | POA: Diagnosis not present

## 2017-04-23 ENCOUNTER — Telehealth (HOSPITAL_COMMUNITY): Payer: Self-pay | Admitting: Pharmacist

## 2017-04-23 ENCOUNTER — Ambulatory Visit (INDEPENDENT_AMBULATORY_CARE_PROVIDER_SITE_OTHER): Payer: BLUE CROSS/BLUE SHIELD | Admitting: Emergency Medicine

## 2017-04-23 ENCOUNTER — Encounter: Payer: Self-pay | Admitting: Emergency Medicine

## 2017-04-23 DIAGNOSIS — I2729 Other secondary pulmonary hypertension: Secondary | ICD-10-CM | POA: Diagnosis not present

## 2017-04-23 DIAGNOSIS — J9611 Chronic respiratory failure with hypoxia: Secondary | ICD-10-CM

## 2017-04-23 DIAGNOSIS — L940 Localized scleroderma [morphea]: Secondary | ICD-10-CM | POA: Diagnosis not present

## 2017-04-23 DIAGNOSIS — I779 Disorder of arteries and arterioles, unspecified: Secondary | ICD-10-CM

## 2017-04-23 NOTE — Assessment & Plan Note (Signed)
Continue O2 as ordered.

## 2017-04-23 NOTE — Assessment & Plan Note (Signed)
R heart data reviewed - no significant change from her prior. She did not tolerate IV therapy, agree with trial addition selexipag. Will follow with her in 3 months to assess clinical response. Correlate with Dr [name].

## 2017-04-23 NOTE — Assessment & Plan Note (Signed)
Continue current prednisone 36m  Will defer a repeat CT chest at this time. Plan for subsequent imaging depending on her clinical status, no later than 1 year from now.

## 2017-04-23 NOTE — Patient Instructions (Addendum)
Please continue your oxygen at 2-3L/min depending on your exertional level.  Continue your prednisone 43m daily Continue your sildenifil and macetentan.  Agree with starting selexipag as per Dr Bensimhon's plans.  Follow with Dr BLamonte Sakaiin 3 months or sooner if you have any problems.

## 2017-04-23 NOTE — Progress Notes (Signed)
Subjective:    [name], female    DOB: 10/03/1951, 66 y.o.   MRN: [id number] HPI 66 yo woman, never smoker, hx of scleroderma, Raynaud's, non-Hodgkins lymphoma (treated '10), HTN, PAH originally dx in [place] and treated, but meds d/c'd when she improved. Has been seen in by Dr [name], had R heart cath as below in 03/2012. She has been restarted on therapy, seen both in [place] and at [facility] >> [place] + Revatio + Veletri (since 2/'14). She has felt worse on IV therapy, progressive as it titrated up.  Dr Gilles Chiquito scaled back her veletri for this reason. She has had progressive dyspnea - was just started prednisone 245m qd. She has benefited significantly, better exertional tolerance and SpO2.    Underwent RHC on 04/18/12 as below:  RA = 9  RV = 75/11/14  PA = 71/25 (44)  PCW = 4  Fick cardiac output/index = 3.0/1.7  Thermo CO/CI = 2.7/1.5  PVR = 23.5 Woods  FA sat = 79% on room air (checked 3 times). Sat with finger probe on air 94%  PA sat = 41%, 43%  MW 04/18/12 880 feet .   ROV 05/02/16 -- patient with a history of scleroderma on low-dose prednisone and secondary primary hypertension. Also with a history of non-Hodgkin's lymphoma that has been in remission since 2010. She had an echocardiogram on 05/01/16 and have reviewed the results. This showed a stable left ventricular ejection fraction, mild flattening of the intermittent radicular septum and a moderately dilated RV with decreased systolic function. Her PA systolic pressure was estimated at 75 mmHg. Her macitentan was continued sildenafil was changed to Adcirca 44mdaily (not yet available from the pharmacy).   She has been overall stable, although she has been dealing with the stress of her husband being sick. She tells me that she has increased her home O2 to 3L/min, is on 2L/min pulsed on POC. She remains on pred 2.45m72mdecreased 6 months ago by Dr BeeAmil AmenShe has been dealing with symptomatic anemia without evidence GIB  ROV  08/21/16 -- Patient follows up today for her history of secondary pulmonary hypertension in the setting of scleroderma. As of our last visit she was on macetentan and was about to change from sildenafil to AdcCox Communicationshe made the switch but did not tolerate so she is back to sildenifil 15m59md. She's also been maintained on prednisone, current dose is 45mg 24mly. She is followed by Dr EdwarOletta LamasI and Dr Early for her vascular disease. She is to have angiography next month to evaluate LE's. Last TTE was May 2017.   Acute OV 01/23/17 -- this is an acute office visit. Patient has a history of secondary pulmonary hypertension in setting of scleroderma. Has been on sildenifil and and macetentan, prednisone 45mg. 30m was treated for bronchitis 10/31/16 with brief increase in pred to 10mg, 71mnex. She is now having more head congestion, associated with cough esp at night. She is coughing up clear to white mucous. No fever.   ROV 04/23/17 -- this is a follow-up visit for patient with a history of scleroderma on chronic prednisone, secondary pulmonary hypertension that is currently managed on macetentan and sildenifil. She underwent r heart cath 4/20 that I have reviewed, as below. Recommendation made to try addition of selexipag to her current regimen. She reports that her breathing is stable, she is able to exert some. She feels at baseline. Turns her o2 to 3l/min with exertion. Her joints  are sore.    04/19/17 --  RA = 4 RV = 80/8 PA = 79/17 (43) PCW = 6 Fick cardiac output/index = 4.1/2.4 PVR = 9.0 WU Ao sat = 94% PA sat = 59%, 62%   Recent Labs Lab 04/19/17 0728  HGB 12.9  HCT 40.8  WBC 6.0  PLT 270      Review of Systems  Constitutional: Negative for appetite change, fever and unexpected weight change.  HENT: Negative for congestion, dental problem, ear pain, nosebleeds, postnasal drip, rhinorrhea, sinus pressure, sneezing, sore throat and trouble swallowing.   Eyes: Negative for redness and  itching.  Respiratory: Positive for shortness of breath. Negative for cough, chest tightness and wheezing.   Cardiovascular: Negative for palpitations and leg swelling.  Gastrointestinal: Negative for nausea and vomiting.  Genitourinary: Negative for dysuria.  Musculoskeletal: Negative for joint swelling.  Skin: Negative for rash.  Neurological: Negative for headaches.  Hematological: Does not bruise/bleed easily.  Psychiatric/Behavioral: Negative for dysphoric mood. The patient is not nervous/anxious.        Objective:   Physical Exam Vitals:   04/23/17 1159  BP: 120/70  Pulse: 68  SpO2: 93%  Weight: 146 lb (66.2 kg)  Height: _0  (1.702 m)   Gen: Pleasant, thin, in no distress,  normal affect on pulsed nasal cannula O2  ENT: No lesions,  mouth clear,  oropharynx clear, no postnasal drip  Neck: No JVD, no TMG, no carotid bruits  Lungs: No use of accessory muscles, few bibasilar insp crackles  Cardiovascular: RRR, heart sounds normal, no murmur or gallops, trace peripheral edema  Musculoskeletal: No deformities, no cyanosis or clubbing  Neuro: alert, non focal  Skin: significant changes from scleroderma on arms, hands, fingers; right lower extremity wound is wrapped and dressed      Assessment & Plan:  Scleroderma, circumscribed or localized Continue current prednisone 1m  Will defer a repeat CT chest at this time. Plan for subsequent imaging depending on her clinical status, no later than 1 year from now.   Chronic respiratory failure (HCC) Continue O2 as ordered.   Pulmonary hypertension associated with systemic disorder R heart data reviewed - no significant change from her prior. She did not tolerate IV therapy, agree with trial addition selexipag. Will follow with her in 3 months to assess clinical response. Correlate with Dr BHaroldine Laws   RBaltazar Apo MD, PhD 04/23/2017, 12:31 PM [facility] [phone] if no answer [phone]

## 2017-04-23 NOTE — Telephone Encounter (Addendum)
Interaction with Plavix and Uptravi. Plavix is a CYP2C8 inhibitor which may increase the concentrations of Uptravi. Per Dr. [name], will titrate Uptravi cautiously. May consider d/c Plavix in near future.  Ruta Hinds. Velva Harman, PharmD, BCPS, CPP Clinical Pharmacist Pager: [phone] Phone: [phone] 04/23/2017 10:45 AM

## 2017-04-28 ENCOUNTER — Other Ambulatory Visit: Payer: Self-pay | Admitting: Internal Medicine

## 2017-04-28 DIAGNOSIS — I779 Disorder of arteries and arterioles, unspecified: Secondary | ICD-10-CM

## 2017-04-29 ENCOUNTER — Telehealth: Payer: Self-pay | Admitting: *Deleted

## 2017-04-29 ENCOUNTER — Other Ambulatory Visit: Payer: Self-pay | Admitting: *Deleted

## 2017-04-29 DIAGNOSIS — R6889 Other general symptoms and signs: Secondary | ICD-10-CM

## 2017-04-29 DIAGNOSIS — L819 Disorder of pigmentation, unspecified: Secondary | ICD-10-CM

## 2017-04-29 DIAGNOSIS — M349 Systemic sclerosis, unspecified: Secondary | ICD-10-CM

## 2017-04-29 DIAGNOSIS — I87331 Chronic venous hypertension (idiopathic) with ulcer and inflammation of right lower extremity: Secondary | ICD-10-CM | POA: Diagnosis not present

## 2017-04-29 NOTE — Telephone Encounter (Signed)
Mrs. Brogden called Triage today c/o right arm and hand coolness and "feeling like there is no blood going down to her fingers". She has a history of scleroderma and has had callus-like ulcers on the tips of her fingers.  On 04-19-17, Dr. [name] did a right heart catheterization with a right brachial approach. Patient states that she has had these symptoms for about a month prior to her heart cath. The symptoms have not changed in intensity since the cath. Dr. [name] has previously seen her for lower extremity arterial insufficiency and venous stasis ulceration. I will schedule her to come in for RUE duplex and to see Dr. [name]. I also strongly suggested that she call Dr. [name]' office to report this since she did have cath access in the arm.  She voiced understanding and agreement of this plan.

## 2017-04-30 ENCOUNTER — Encounter: Payer: Self-pay | Admitting: Vascular Surgery

## 2017-04-30 ENCOUNTER — Ambulatory Visit: Payer: BLUE CROSS/BLUE SHIELD

## 2017-04-30 ENCOUNTER — Ambulatory Visit (INDEPENDENT_AMBULATORY_CARE_PROVIDER_SITE_OTHER): Payer: BLUE CROSS/BLUE SHIELD | Admitting: Physician Assistant

## 2017-04-30 ENCOUNTER — Ambulatory Visit (HOSPITAL_COMMUNITY)
Admission: RE | Admit: 2017-04-30 | Discharge: 2017-04-30 | Disposition: A | Discharge status: Home / Self Care | Payer: BLUE CROSS/BLUE SHIELD | Source: Ambulatory Visit | Attending: Vascular Surgery | Admitting: Vascular Surgery

## 2017-04-30 VITALS — BP 143/70 | HR 66 | Temp 97.7°F | Resp 18 | Ht 67.0 in | Wt 140.3 lb

## 2017-04-30 DIAGNOSIS — L819 Disorder of pigmentation, unspecified: Secondary | ICD-10-CM | POA: Diagnosis not present

## 2017-04-30 DIAGNOSIS — M349 Systemic sclerosis, unspecified: Secondary | ICD-10-CM

## 2017-04-30 DIAGNOSIS — L98491 Non-pressure chronic ulcer of skin of other sites limited to breakdown of skin: Secondary | ICD-10-CM

## 2017-04-30 DIAGNOSIS — R6889 Other general symptoms and signs: Secondary | ICD-10-CM

## 2017-04-30 NOTE — Telephone Encounter (Signed)
Sched appt 04/30/17; lab at 2:00 and PA at 3:00. Spoke to pt. Pt said they would rather wait to see TFE. Resched provider appt to 05/15/17 at 3:30.

## 2017-04-30 NOTE — Progress Notes (Signed)
History of Present Illness:  Patient is a 66 y.o. year old female who presents for with new right finger wounds.  Tip of index and nail edge medially of ring finger.  She states she has been using neosporin and then just band aides.  She states the right hand is colder than the left and is concerned about her circulation.  She has Scleroderma with Raynaud.   He most recent procedure was cardiac catheterization access of the right brachial artery.  These wounds were present prior to this intervention.     Past Medical History:  Diagnosis Date  . Anemia   . Arthritis   . DOE (dyspnea on exertion)   . Epistaxis   . History of chicken pox   . Hypertension   . Leg ulcer ([place])   . Melanoma ([place]) 1999  . 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[place])   . Non Hodgkin's lymphoma ([place])    Treated with chemo 2010, felt to be cured.  Marland Kitchen PAH (pulmonary artery hypertension) ([place])   . Peripheral arterial occlusive disease ([place])   . Pulmonary hypertension ([place])   . Requires supplemental oxygen    2 liters  . Sclerodermia generalized [facility])     Past Surgical History:  Procedure Laterality Date  . apligraph  12-04-2102,   12-18-2012   bilateral calves   [name]  . biopsy on cerebellum    . COLONOSCOPY WITH PROPOFOL Left 10/07/2015   Procedure: COLONOSCOPY WITH PROPOFOL;  Surgeon: [name], MD;  Location: [place];  Service: Endoscopy;  Laterality: Left;  . ENDARTERECTOMY FEMORAL Right 09/14/2016   Procedure: ENDARTERECTOMY FEMORAL RIGHT;  Surgeon: [name], MD;  Location: [facility] OR;  Service: Vascular;  Laterality: Right;  . ENDOVENOUS ABLATION SAPHENOUS VEIN W/ LASER  07-24-2012   right greater saphenous vein by [name], M.D.   . ENDOVENOUS ABLATION SAPHENOUS VEIN W/ LASER  10-16-2012   left greater saphenous vein  by Dr. [name]  . ENDOVENOUS ABLATION SAPHENOUS VEIN W/ LASER Right 09-01-2015   endovenous laser ablation right greater saphenous vein by [name] MD  .  ESOPHAGOGASTRODUODENOSCOPY N/A 05/07/2016   Procedure: ESOPHAGOGASTRODUODENOSCOPY (EGD);  Surgeon: [name], MD;  Location: [facility] ENDOSCOPY;  Service: Endoscopy;  Laterality: N/A;  . ESOPHAGOGASTRODUODENOSCOPY (EGD) WITH PROPOFOL Left 10/07/2015   Procedure: ESOPHAGOGASTRODUODENOSCOPY (EGD) WITH PROPOFOL;  Surgeon: [name], MD;  Location: [place];  Service: Endoscopy;  Laterality: Left;  . GIVENS CAPSULE STUDY N/A 07/31/2016   Procedure: GIVENS CAPSULE STUDY;  Surgeon: [name], MD;  Location: [facility] ENDOSCOPY;  Service: Endoscopy;  Laterality: N/A;  . melonoma removes Right    behind right knee  . PATCH ANGIOPLASTY Right 09/14/2016   Procedure: PATCH ANGIOPLASTY RIGHT FEMORAL ARTERY USING HEMASHIELD PLATINUM FINESSE PATCH;  Surgeon: [name], MD;  Location: [place];  Service: Vascular;  Laterality: Right;  . PERIPHERAL VASCULAR CATHETERIZATION N/A 09/07/2016   Procedure: Abdominal Aortogram w/Lower Extremity;  Surgeon: [name], MD;  Location: [place] CV LAB;  Service: Cardiovascular;  Laterality: N/A;  . RIGHT HEART CATH N/A 04/19/2017   Procedure: Right Heart Cath;  Surgeon: [name], MD;  Location: [place] CV LAB;  Service: Cardiovascular;  Laterality: N/A;  . RIGHT HEART CATHETERIZATION N/A 11/14/2012   Procedure: RIGHT HEART CATH;  Surgeon: [name], MD;  Location: [facility] CATH LAB;  Service: Cardiovascular;  Laterality: N/A;  . rt little toe removal  10/1998  . stent left thigh  3/11  . TONSILLECTOMY AND ADENOIDECTOMY    . TUNNELED VENOUS CATHETER PLACEMENT  01/2013  . VEIN LIGATION AND STRIPPING  05/16/2012   Procedure: VEIN LIGATION AND STRIPPING;  Surgeon: [name], MD;  Location: [facility] OR;  Service: Vascular;  Laterality: Right;  Irrigation and Debridement right lower leg, ligation of saphenous vein.     Social History Social History  Substance Use Topics  . Smoking status: Never Smoker  . Smokeless tobacco: Never Used  . Alcohol use 0.0  oz/week     Comment: Occasional    Family History Family History  Problem Relation Age of Onset  . Lupus Father   . Lymphoma Father   . Hyperlipidemia Mother   . Cancer Daughter     sarcoma  . Colon cancer      Allergies  Allergies  Allergen Reactions  . Sulfa Antibiotics Rash    Severe rash with significant peeling of face. No airway involvement per patient  . Levaquin [Levofloxacin In D5w] Other (See Comments)    FLU LIKE SYMPTOMS  . Doxycycline Hyclate Swelling    TABLETS ONLY - CAPSULES ARE TOLERATED FINE  . Penicillins Rash    Has patient had a PCN reaction causing immediate rash, facial/tongue/throat swelling, SOB or lightheadedness with hypotension: no Has patient had a PCN reaction causing severe rash involving mucus membranes or skin necrosis: no Has patient had a PCN reaction that required hospitalization: no Has patient had a PCN reaction occurring within the last 10 years: {no If all of the above answers are "NO", then may proceed with Cephalosporin use.     Current Outpatient Prescriptions  Medication Sig Dispense Refill  . acetaminophen (TYLENOL ARTHRITIS PAIN) 650 MG CR tablet Take 1,300 mg by mouth every 8 (eight) hours as needed for pain.     Marland Kitchen acidophilus (RISAQUAD) CAPS Take 1 capsule by mouth every morning.     Marland Kitchen atorvastatin (LIPITOR) 10 MG tablet take 1 tablet by mouth once daily 30 tablet 1  . BIOTIN PO Take 1 tablet by mouth daily.    . cholecalciferol (VITAMIN D) 1000 UNITS tablet Take 2,000 Units by mouth every evening.     . citalopram (CELEXA) 20 MG tablet Take 2 tablets (40 mg total) by mouth daily. (Patient taking differently: Take 20 mg by mouth at bedtime. ) 180 tablet 1  . clopidogrel (PLAVIX) 75 MG tablet take 1 tablet by mouth once daily RESUME IN 1 WEEK (Patient taking differently: take 1 tablet by mouth once daily) 30 tablet 1  . Cultured Skin Substitute (APLIGRAF EX) Apply 1 application topically every 14 (fourteen) days.    . fish  oil-omega-3 fatty acids 1000 MG capsule Take 1,000 mg by mouth 2 (two) times daily.     Marland Kitchen gabapentin (NEURONTIN) 300 MG capsule take 1 capsules by mouth every morning and 2 every evening 180 capsule 1  . iron polysaccharides (POLY-IRON 150) 150 MG capsule Take 1 capsule (150 mg total) by mouth 2 (two) times daily. 60 capsule 5  . Macitentan (OPSUMIT) 10 MG TABS Take 1 tablet (10 mg total) by mouth daily. 90 tablet 3  . moxifloxacin (VIGAMOX) 0.5 % ophthalmic solution Place 1 drop into both eyes 3 (three) times daily as needed (allergies).     . multivitamin (THERAGRAN) per tablet Take 1 tablet by mouth daily.     . OXYGEN Inhale 2-3 L into the lungs continuous.     . pantoprazole (PROTONIX) 40 MG tablet Take 1 tablet (  40 mg total) by mouth 2 (two) times daily. 60 tablet 1  . potassium chloride (K-DUR) 10 MEQ tablet take 1 tablet by mouth once daily 30 tablet 1  . predniSONE (DELTASONE) 5 MG tablet take 1 tablet by mouth once daily WITH BREAKFAST 30 tablet 0  . sildenafil (REVATIO) 20 MG tablet TAKE 2 TABLETS BY MOUTH THREE TIMES A DAY 180 tablet 2  . spironolactone (ALDACTONE) 25 MG tablet take 1 tablet by mouth once daily 30 tablet 1  . torsemide (DEMADEX) 20 MG tablet take 2 tablets by mouth twice a day 120 tablet 1  . vitamin B-12 (CYANOCOBALAMIN) 500 MCG tablet Take 500 mcg by mouth every morning.     Marland Kitchen azelastine (ASTELIN) 0.1 % nasal spray Use 2 sprays in each nostril 2-3 times a day as need (Patient not taking: Reported on 04/30/2017) 30 mL 2   No current facility-administered medications for this visit.     ROS:   General:  No weight loss, Fever, chills  HEENT: No recent headaches, no nasal bleeding, no visual changes, no sore throat  Neurologic: No dizziness, blackouts, seizures. No recent symptoms of stroke or mini- stroke. No recent episodes of slurred speech, or temporary blindness.  Cardiac: No recent episodes of chest pain/pressure, no shortness of breath at rest.  No shortness  of breath with exertion.  Denies history of atrial fibrillation or irregular heartbeat  Vascular: No history of rest pain in feet.  No history of claudication.  No history of non-healing ulcer, No history of DVT   Pulmonary: On home oxygen 24 hours/day, no productive cough, no hemoptysis,  No asthma or wheezing  Musculoskeletal:  [x ] Arthritis, _0  Low back pain,  _1  Joint pain  Hematologic:No history of hypercoagulable state.  No history of easy bleeding.  No history of anemia  Gastrointestinal: No hematochezia or melena,  No gastroesophageal reflux, no trouble swallowing  Urinary: _2  chronic Kidney disease, _3  on HD - _4  MWF or _5  TTHS, _6  Burning with urination, _7  Frequent urination, _8  Difficulty urinating;   Skin: finger tip ulcers  Psychological: No history of anxiety,  No history of depression   Physical Examination  Vitals:   04/30/17 1454  BP: (!) 143/70  Pulse: 66  Resp: 18  Temp: 97.7 F (36.5 C)  TempSrc: Oral  SpO2: 94%  Weight: 140 lb 4.8 oz (63.6 kg)  Height: _9  (1.702 m)    Body mass index is 21.97 kg/m.  General:  Alert and oriented, no acute distress HEENT: Normal Neck: No bruit or JVD Pulmonary: Clear to auscultation bilaterally Cardiac: Regular Rate and Rhythm without murmur Gastrointestinal: Soft, non-tender, non-distended, no mass, no scars Skin: No rash Extremity Pulses:  3+ radial, brachial pulses bilaterally.  Doppler biphasic palmer arch Musculoskeletal: No deformity or edema  Neurologic: Upper and lower extremity motor grossly intact and symmetric Decreased motor in hands secondary to arthritic deformities in the hands.  DATA:  Right UE arterial Duplex Triphasic flow patent arterial flow right UE.   ASSESSMENT/PLAN:  Finger tip ulcers She has very good triphasic arterial blood flow.   Continue dial soap washes to wounds an band aides for skin protection until the wounds heal.   If the wounds get worse and progress up  her fingers she will need to consult he hand orthopedic.  We can revascularize to the wrist, but the arteries in the digits are too small for revascularization.  The  patient was examined by Dr. [name] today as well   Theda Sers, Aphrodite [facility] PA-C [facility]  MD in clinic [place]

## 2017-05-06 ENCOUNTER — Telehealth (HOSPITAL_COMMUNITY): Payer: Self-pay | Admitting: Pharmacist

## 2017-05-06 ENCOUNTER — Encounter (HOSPITAL_BASED_OUTPATIENT_CLINIC_OR_DEPARTMENT_OTHER): Payer: BLUE CROSS/BLUE SHIELD | Attending: Internal Medicine

## 2017-05-06 DIAGNOSIS — D649 Anemia, unspecified: Secondary | ICD-10-CM | POA: Insufficient documentation

## 2017-05-06 DIAGNOSIS — I83222 Varicose veins of left lower extremity with both ulcer of calf and inflammation: Secondary | ICD-10-CM | POA: Insufficient documentation

## 2017-05-06 DIAGNOSIS — L942 Calcinosis cutis: Secondary | ICD-10-CM | POA: Insufficient documentation

## 2017-05-06 DIAGNOSIS — M1990 Unspecified osteoarthritis, unspecified site: Secondary | ICD-10-CM | POA: Insufficient documentation

## 2017-05-06 DIAGNOSIS — L97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: Secondary | ICD-10-CM | POA: Insufficient documentation

## 2017-05-06 DIAGNOSIS — M069 Rheumatoid arthritis, unspecified: Secondary | ICD-10-CM | POA: Insufficient documentation

## 2017-05-06 DIAGNOSIS — L97811 Non-pressure chronic ulcer of other part of right lower leg limited to breakdown of skin: Secondary | ICD-10-CM | POA: Diagnosis not present

## 2017-05-06 DIAGNOSIS — I10 Essential (primary) hypertension: Secondary | ICD-10-CM | POA: Diagnosis not present

## 2017-05-06 MED ORDER — SELEXIPAG 200 MCG PO TABS: 200.0000 ug | ORAL_TABLET | Freq: Two times a day (BID) | ORAL | 11 refills | Status: DC

## 2017-05-06 NOTE — Addendum Note (Signed)
Addended by: Adora Fridge on: 05/06/2017 02:49 PM   Modules accepted: Orders

## 2017-05-06 NOTE — Telephone Encounter (Signed)
Uptravi 200 mcg and 800 mcg tablets approved by [employer] through 12/30/38.   Ruta Hinds. Velva Harman, PharmD, BCPS, CPP Clinical Pharmacist Pager: [phone] Phone: [phone] 05/06/2017 3:04 PM

## 2017-05-13 DIAGNOSIS — I83222 Varicose veins of left lower extremity with both ulcer of calf and inflammation: Secondary | ICD-10-CM | POA: Diagnosis not present

## 2017-05-15 ENCOUNTER — Ambulatory Visit (HOSPITAL_COMMUNITY)
Admission: RE | Admit: 2017-05-15 | Discharge: 2017-05-15 | Disposition: A | Discharge status: Home / Self Care | Payer: BLUE CROSS/BLUE SHIELD | Source: Ambulatory Visit | Attending: Internal Medicine | Admitting: Internal Medicine

## 2017-05-15 ENCOUNTER — Ambulatory Visit: Payer: BLUE CROSS/BLUE SHIELD | Admitting: Vascular Surgery

## 2017-05-15 ENCOUNTER — Other Ambulatory Visit: Payer: Self-pay | Admitting: Internal Medicine

## 2017-05-15 VITALS — BP 130/70 | HR 68 | Ht 67.0 in | Wt 142.5 lb

## 2017-05-15 DIAGNOSIS — Z79899 Other long term (current) drug therapy: Secondary | ICD-10-CM | POA: Insufficient documentation

## 2017-05-15 DIAGNOSIS — Z880 Allergy status to penicillin: Secondary | ICD-10-CM | POA: Diagnosis not present

## 2017-05-15 DIAGNOSIS — I10 Essential (primary) hypertension: Secondary | ICD-10-CM | POA: Diagnosis not present

## 2017-05-15 DIAGNOSIS — M349 Systemic sclerosis, unspecified: Secondary | ICD-10-CM | POA: Diagnosis not present

## 2017-05-15 DIAGNOSIS — Z7902 Long term (current) use of antithrombotics/antiplatelets: Secondary | ICD-10-CM | POA: Diagnosis not present

## 2017-05-15 DIAGNOSIS — Z8572 Personal history of non-Hodgkin lymphomas: Secondary | ICD-10-CM | POA: Diagnosis not present

## 2017-05-15 DIAGNOSIS — Z9981 Dependence on supplemental oxygen: Secondary | ICD-10-CM | POA: Diagnosis not present

## 2017-05-15 DIAGNOSIS — J849 Interstitial pulmonary disease, unspecified: Secondary | ICD-10-CM | POA: Insufficient documentation

## 2017-05-15 DIAGNOSIS — I2729 Other secondary pulmonary hypertension: Secondary | ICD-10-CM

## 2017-05-15 DIAGNOSIS — Z9221 Personal history of antineoplastic chemotherapy: Secondary | ICD-10-CM | POA: Insufficient documentation

## 2017-05-15 DIAGNOSIS — L97929 Non-pressure chronic ulcer of unspecified part of left lower leg with unspecified severity: Secondary | ICD-10-CM | POA: Insufficient documentation

## 2017-05-15 DIAGNOSIS — I272 Pulmonary hypertension, unspecified: Secondary | ICD-10-CM | POA: Insufficient documentation

## 2017-05-15 DIAGNOSIS — Z85820 Personal history of malignant melanoma of skin: Secondary | ICD-10-CM | POA: Insufficient documentation

## 2017-05-15 DIAGNOSIS — L97919 Non-pressure chronic ulcer of unspecified part of right lower leg with unspecified severity: Secondary | ICD-10-CM | POA: Diagnosis not present

## 2017-05-15 DIAGNOSIS — Z882 Allergy status to sulfonamides status: Secondary | ICD-10-CM | POA: Insufficient documentation

## 2017-05-15 DIAGNOSIS — I739 Peripheral vascular disease, unspecified: Secondary | ICD-10-CM | POA: Insufficient documentation

## 2017-05-15 NOTE — Patient Instructions (Signed)
Your physician recommends that you schedule a follow-up appointment in: 4-6 weeks

## 2017-05-15 NOTE — Progress Notes (Signed)
PULMONARY HYPERTENSION CLINIC  [name], female   DOB: July 25, 1951, 66 y.o.   MRN: [id number] [name], female   DOB: [birth year], 66 y.o.   MRN: [id number] Primary Cardiologist: Dr. [name] Pulmonologist: Dr. Lamonte Sakai  HPI: [name] [name] is a 66 y.o. 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 We saw her initially in 2013.    PAH Meds: 1. started on IV epoprostenol (Veletri) in 01/2013. She had intolerable side effects with Veletri.  Admitted to [facility] 12/17/13 through 12/19 for Veletri wean and Tyvaso initiation. 2.  Stopped Tyvaso in 2015. 3. February 2016 switched bosentan to Macitentan 4. Now on Macitentan 10 and sildenafil 40 tid (did not tolerate 60 tid or Adcirca).  5. Selexipag added 5/18  RHC 04/19/17   RA = 4 RV = 80/8 PA = 79/17 (43) PCW = 6 Fick cardiac output/index = 4.1/2.4 PVR = 9.0 WU Ao sat = 94% PA sat = 59%, 62%    Here for f/u: Since last visit underwent repeat RHC. Selexipag added. Started selexipag on Monday. Initially had bad HA and jaw claudication. That resolved. Now having loose stools/urgency. No ab pain. On 200 bid.   Doing ok able to do all ADLs. Main limitation is peripheral neuropathy.  She is able to walk around in stores with her oxygen without significant problem, uses 2-3L O2. She has chronic lower extremity ulcers  followed at the wound clinic and these are improving. Had PCI of right LE in 1/18 (no stent) No syncope or significant edema. Sats 94-95 with ear probe at home    Studies   6 MW 04/18/12 880 feet 6 MW 11/10/12 650 feet. Limited by legs. Sats down to 85% on 5L (sats at rest 95%)  6 MW 3/15 915 feet (279 m) 6 MW 2/16 930 feet (283 m)  Echo 10/13 LV EF 65-70%. RV severely enlarged/HK severe TR. RVSP 78mHg Echo (10/15) with EF 60-65%, D-shaped interventricular septum,  moderately dilated RV with moderately decreased systolic function, PA systolic pressure 72 mmHg, IVC not dilated.  Echo (5/17) with EF 65-70%, Mildly flattened septum, severely dilated RV with moderately decreased systolic function, PA systolic pressure 68 mmHg, IVC not dilated.   RHC 04/18/12  RA = 9  RV = 75/11/14  PA = 71/25 (44)  PCW = 4  Fick cardiac output/index = 3.0/1.7  Thermo CO/CI = 2.7/1.5  PVR = 13.3 [place]  FA sat = 79% on room air (checked 3 times). Sat with finger probe on air 94%  PA sat = 41%, 43%   RHC 11/14/12  RA = 14  RV = 84/8/20  PA = 83/29 (50)  PCW = 10  Fick cardiac output/index = 4.5/2.5  Thermo CO/CI = 3.8/2.2  PVR = 8.9 Woods (Fick)  FA sat = 86% on 3L  PA sat = 55%, 57  ABI 4/15: R 0.86 L 1.0   Labs (3/15): K 3.9, creatinine 1.0, LFTs normal Labs (05/17/14): K 3.4 Cr 1.1 Bun 40 LFTs ok  Labs (6/15): K 4.3, creatinine 1.15 Labs (8/15): creatinine 1.4 Labs (10/15): LFTs normal Labs (2/16): LFTs normal  Review of Systems: All pertinent positives and negatives as in HPI, otherwise negative.   Past Medical History  Diagnosis Date  . History of chicken pox   . Hypertension   . [place]clerodermia [place] ([place]   .  Non Hodgkin's lymphoma ([place])     Treated with chemo 2010, felt to be cured.  . Melanoma ([place]) 1999  . Peripheral arterial occlusive disease ([place])   . 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[place])   . DOE (dyspnea on exertion)   . PAH (pulmonary artery hypertension) ([place])   . Epistaxis   . Pulmonary hypertension ([place])     Prior to Admission medications   Medication Sig Start Date End Date Taking? Authorizing Provider  acetaminophen (TYLENOL ARTHRITIS PAIN) 650 MG CR tablet Take 1,300 mg by mouth every 8 (eight) hours as needed for pain.    Yes [provider]  acidophilus (RISAQUAD) CAPS Take 1 capsule by mouth every morning.    Yes [provider]  atorvastatin (LIPITOR) 10 MG tablet take 1 tablet by mouth once daily 04/29/17  Yes  Hoyt Koch, MD  azelastine (ASTELIN) 0.1 % nasal spray Use 2 sprays in each nostril 2-3 times a day as need 01/23/17 01/23/18 Yes Byrum, Rose Fillers, MD  BIOTIN PO Take 1 tablet by mouth daily.   Yes [provider]  cholecalciferol (VITAMIN D) 1000 UNITS tablet Take 2,000 Units by mouth every evening.    Yes [provider]  citalopram (CELEXA) 20 MG tablet Take 20 mg by mouth at bedtime.   Yes [provider]  clopidogrel (PLAVIX) 75 MG tablet Take 75 mg by mouth daily.   Yes [provider]  Cultured Skin Substitute (APLIGRAF EX) Apply 1 application topically every 14 (fourteen) days.   Yes [provider]  fish oil-omega-3 fatty acids 1000 MG capsule Take 1,000 mg by mouth 2 (two) times daily.    Yes [provider]  gabapentin (NEURONTIN) 300 MG capsule take 1 capsules by mouth every morning and 2 every evening 03/07/17  Yes Hoyt Koch, MD  iron polysaccharides (POLY-IRON 150) 150 MG capsule Take 1 capsule (150 mg total) by mouth 2 (two) times daily. 10/01/16  Yes Hoyt Koch, MD  Macitentan (OPSUMIT) 10 MG TABS Take 1 tablet (10 mg total) by mouth daily. 03/26/17  Yes Jaeliana Lococo, Shaune Pascal, MD  moxifloxacin (VIGAMOX) 0.5 % ophthalmic solution Place 1 drop into both eyes 3 (three) times daily as needed (allergies).    Yes [provider]  multivitamin [facility]) per tablet Take 1 tablet by mouth daily.    Yes [provider]  OXYGEN Inhale 2-3 L into the lungs continuous.    Yes [provider]  pantoprazole (PROTONIX) 40 MG tablet Take 1 tablet (40 mg total) by mouth 2 (two) times daily. 05/08/16  Yes Robbie Lis, MD  potassium chloride (K-DUR) 10 MEQ tablet take 1 tablet by mouth once daily 04/15/17  Yes Hoyt Koch, MD  predniSONE (DELTASONE) 5 MG tablet take 1 tablet by mouth once daily WITH BREAKFAST 04/17/17  Yes Hoyt Koch, MD  Selexipag (UPTRAVI) 200 MCG TABS Take 1  tablet (200 mcg total) by mouth 2 (two) times daily. 05/06/17  Yes Elfrida Pixley, Shaune Pascal, MD  sildenafil (REVATIO) 20 MG tablet TAKE 2 TABLETS BY MOUTH THREE TIMES A DAY 04/04/17  Yes Jesusita Jocelyn, Shaune Pascal, MD  spironolactone (ALDACTONE) 25 MG tablet take 1 tablet by mouth once daily 04/01/17  Yes Hoyt Koch, MD  torsemide [facility]) 20 MG tablet take 2 tablets by mouth twice a day 04/29/17  Yes Hoyt Koch, MD  vitamin B-12 (CYANOCOBALAMIN) 500 MCG tablet Take 500 mcg by mouth every morning.    Yes [provider]     Allergies  Allergen Reactions  . Sulfa Antibiotics     Severe rash with significant peeling of face. No airway involvement per patient  . Levaquin [Levofloxacin In D5w] Other (See Comments)    Other reaction(s): Other (See Comments) FLU LIKE SYMPTOM  . Penicillins Rash     Filed Vitals:   05/01/16 1357  BP: 138/70  Pulse: 75  Weight: 135 lb (61.236 kg)    PHYSICAL EXAM: General: No acute distress No respiratory difficulty HEENT: normal anicteric Neck: supple. JVP 6. Carotids 2+ bilat; no bruits. No lymphadenopathy or thryomegaly appreciated. Cor: PMI nondisplaced. Regular rate & rhythm. Prominent P2. +RV lift. Soft TR Lungs: clear on 4 liters [place] no wheeze Abdomen: soft, NT/ND. No hepatosplenomegaly. No bruits or masses. Good bowel sounds. 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 + gouty nodules. LEs wrapped due to ulcers. Very mild edema  Neuro: alert & oriented x 3, cranial nerves grossly intact. moves all 4 extremities w/o difficulty. Affect pleasant.  ASSESSMENT & PLAN:  1. 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  Unable to tolerate IV epoprostenol and with no improvement on Tyvaso.  She stopped Tyvaso and 6 minute walkshowed no deterioration.  She remains on Revatio and macitentan.  WHO class II-III dyspnea.  Last echo showed moderately dilated and moderately dysfunctional RV with persistent severe pulmonary  hypertension.   - Recent RHC reviewed - Selexipag added earlier this week. Having significant side effects but seems to be abating - Will continue 200 bid for now and not titrate. Given interaction with Plavix will titrate more slowly as well. Discussed with PharmD - She is not volume overloaded on exam.  Continue current torsemide. .  - Continue home oxygen.  2. Scleroderma: Followed by Dr. Amil Amen. 3. Chronic L Ulceration: Followed by Wound Care.  Improvinf 4. PAD: Follows with [facility]. Continue Plavix  5. Interstitial lung disease: In setting of scleroderma.  She is on home oxygen and followed by pulmonary.   Mollee Neer,MD 05/01/2016

## 2017-05-16 NOTE — Telephone Encounter (Signed)
Note says future should come from pulmonary or rheumatology, not PCP, please advise

## 2017-05-20 DIAGNOSIS — I83222 Varicose veins of left lower extremity with both ulcer of calf and inflammation: Secondary | ICD-10-CM | POA: Diagnosis not present

## 2017-05-24 DIAGNOSIS — I83222 Varicose veins of left lower extremity with both ulcer of calf and inflammation: Secondary | ICD-10-CM | POA: Diagnosis not present

## 2017-05-31 ENCOUNTER — Encounter: Payer: Self-pay | Admitting: Nurse Practitioner

## 2017-05-31 ENCOUNTER — Ambulatory Visit (INDEPENDENT_AMBULATORY_CARE_PROVIDER_SITE_OTHER): Payer: BLUE CROSS/BLUE SHIELD | Admitting: Nurse Practitioner

## 2017-05-31 ENCOUNTER — Other Ambulatory Visit (INDEPENDENT_AMBULATORY_CARE_PROVIDER_SITE_OTHER): Payer: BLUE CROSS/BLUE SHIELD

## 2017-05-31 VITALS — BP 122/60 | HR 66 | Temp 98.4°F | Ht 67.0 in | Wt 141.0 lb

## 2017-05-31 DIAGNOSIS — H6981 Other specified disorders of Eustachian tube, right ear: Secondary | ICD-10-CM | POA: Diagnosis not present

## 2017-05-31 DIAGNOSIS — I272 Pulmonary hypertension, unspecified: Secondary | ICD-10-CM

## 2017-05-31 LAB — COMPREHENSIVE METABOLIC PANEL
ALT: 21 U/L (ref 0–35)
AST: 27 U/L (ref 0–37)
Albumin: 4.4 g/dL (ref 3.5–5.2)
Alkaline Phosphatase: 72 U/L (ref 39–117)
BUN: 50 mg/dL — ABNORMAL HIGH (ref 6–23)
CO2: 29 mEq/L (ref 19–32)
Calcium: 9.6 mg/dL (ref 8.4–10.5)
Chloride: 97 mEq/L (ref 96–112)
Creatinine, Ser: 1.27 mg/dL — ABNORMAL HIGH (ref 0.40–1.20)
GFR: 44.52 mL/min — ABNORMAL LOW (ref >60.00)
Glucose, Bld: 127 mg/dL — ABNORMAL HIGH (ref 70–99)
Potassium: 4 mEq/L (ref 3.5–5.1)
Sodium: 135 mEq/L (ref 135–145)
Total Bilirubin: 0.4 mg/dL (ref 0.2–1.2)
Total Protein: 7.8 g/dL (ref 6.0–8.3)

## 2017-05-31 LAB — CBC
HCT: 35 % — ABNORMAL LOW (ref 36.0–46.0)
Hemoglobin: 11.8 g/dL — ABNORMAL LOW (ref 12.0–15.0)
MCHC: 33.6 g/dL (ref 30.0–36.0)
MCV: 97.4 fl (ref 78.0–100.0)
Platelets: 304 10*3/uL (ref 150.0–400.0)
RBC: 3.6 Mil/uL — ABNORMAL LOW (ref 3.87–5.11)
RDW: 14 % (ref 11.5–15.5)
WBC: 7.8 10*3/uL (ref 4.0–10.5)

## 2017-05-31 MED ORDER — METHYLPREDNISOLONE 4 MG PO TBPK: ORAL_TABLET | ORAL | 0 refills | Status: DC

## 2017-05-31 MED ORDER — METHYLPREDNISOLONE ACETATE 40 MG/ML IJ SUSP
40.0000 mg | Freq: Once | INTRAMUSCULAR | Status: AC
Start: 2017-05-31 — End: 2017-05-31
  Administered 2017-05-31: 40 mg via INTRAMUSCULAR

## 2017-05-31 NOTE — Patient Instructions (Addendum)
Start medrol dose pack tomorrow (take with food). Hold prednisone 17m while taking medrol dose pack. Resume prednisone 510mafter completion of medrol dose pack.   Eustachian Tube Dysfunction The eustachian tube connects the middle ear to the back of the nose. It regulates air pressure in the middle ear by allowing air to move between the ear and nose. It also helps to drain fluid from the middle ear space. When the eustachian tube does not function properly, air pressure, fluid, or both can build up in the middle ear. Eustachian tube dysfunction can affect one or both ears. What are the causes? This condition happens when the eustachian tube becomes blocked or cannot open normally. This may result from:  Ear infections.  Colds and other upper respiratory infections.  Allergies.  Irritation, such as from cigarette smoke or acid from the stomach coming up into the esophagus (gastroesophageal reflux).  Sudden changes in air pressure, such as from descending in an airplane.  Abnormal growths in the nose or throat, such as nasal polyps, tumors, or enlarged tissue at the back of the throat (adenoids).  What increases the risk? This condition may be more likely to develop in people who smoke and people who are overweight. Eustachian tube dysfunction may also be more likely to develop in children, especially children who have:  Certain birth defects of the mouth, such as cleft palate.  Large tonsils and adenoids.  What are the signs or symptoms? Symptoms of this condition may include:  A feeling of fullness in the ear.  Ear pain.  Clicking or popping noises in the ear.  Ringing in the ear.  Hearing loss.  Loss of balance.  Symptoms may get worse when the air pressure around you changes, such as when you travel to an area of high elevation or fly on an airplane. How is this diagnosed? This condition may be diagnosed based on:  Your symptoms.  A physical exam of your ear,  nose, and throat.  Tests, such as those that measure: ? The movement of your eardrum (tympanogram). ? Your hearing (audiometry).  How is this treated? Treatment depends on the cause and severity of your condition. If your symptoms are mild, you may be able to relieve your symptoms by moving air into ("popping") your ears. If you have symptoms of fluid in your ears, treatment may include:  Decongestants.  Antihistamines.  Nasal sprays or ear drops that contain medicines that reduce swelling (steroids).  In some cases, you may need to have a procedure to drain the fluid in your eardrum (myringotomy). In this procedure, a small tube is placed in the eardrum to:  Drain the fluid.  Restore the air in the middle ear space.  Follow these instructions at home:  Take over-the-counter and prescription medicines only as told by your health care provider.  Use techniques to help pop your ears as recommended by your health care provider. These may include: ? Chewing gum. ? Yawning. ? Frequent, forceful swallowing. ? Closing your mouth, holding your nose closed, and gently blowing as if you are trying to blow air out of your nose.  Do not do any of the following until your health care provider approves: ? Travel to high altitudes. ? Fly in airplanes. ? Work in a prPension scheme managerr room. ? Scuba dive.  Keep your ears dry. Dry your ears completely after showering or bathing.  Do not smoke.  Keep all follow-up visits as told by your health care provider. This  is important. Contact a health care provider if:  Your symptoms do not go away after treatment.  Your symptoms come back after treatment.  You are unable to pop your ears.  You have: ? A fever. ? Pain in your ear. ? Pain in your head or neck. ? Fluid draining from your ear.  Your hearing suddenly changes.  You become very dizzy.  You lose your balance. This information is not intended to replace advice given to you  by your health care provider. Make sure you discuss any questions you have with your health care provider. Document Released: 01/13/2016 Document Revised: 05/24/2016 Document Reviewed: 01/05/2015 Elsevier Interactive Patient Education  [employer].

## 2017-05-31 NOTE — Progress Notes (Signed)
Subjective:  [name], female    DOB: 05-19-1951  Age: 66 y.o. MRN: [id number]  CC: Hearing Loss (right ear stop up going on for 3 wks. )   Otalgia   There is pain in the right ear. This is a new problem. The current episode started 1 to 4 weeks ago. The problem occurs constantly. The problem has been unchanged. There has been no fever. Pertinent negatives include no abdominal pain, coughing, diarrhea, ear discharge, headaches, hearing loss, neck pain, rash, rhinorrhea, sore throat or vomiting. She has tried nothing for the symptoms. Her past medical history is significant for hearing loss.  she has plane trip coming up in 1week and is concerned about persistent ear pain during flight.  Outpatient Medications Prior to Visit  Medication Sig Dispense Refill  . acetaminophen (TYLENOL ARTHRITIS PAIN) 650 MG CR tablet Take 1,300 mg by mouth every 8 (eight) hours as needed for pain.     Marland Kitchen acidophilus (RISAQUAD) CAPS Take 1 capsule by mouth every morning.     Marland Kitchen atorvastatin (LIPITOR) 10 MG tablet take 1 tablet by mouth once daily 30 tablet 1  . azelastine (ASTELIN) 0.1 % nasal spray Use 2 sprays in each nostril 2-3 times a day as need 30 mL 2  . BIOTIN PO Take 1 tablet by mouth daily.    . cholecalciferol (VITAMIN D) 1000 UNITS tablet Take 2,000 Units by mouth every evening.     . citalopram (CELEXA) 20 MG tablet Take 20 mg by mouth at bedtime.    . clopidogrel (PLAVIX) 75 MG tablet Take 75 mg by mouth daily.    . Cultured Skin Substitute (APLIGRAF EX) Apply 1 application topically every 14 (fourteen) days.    . fish oil-omega-3 fatty acids 1000 MG capsule Take 1,000 mg by mouth 2 (two) times daily.     Marland Kitchen gabapentin (NEURONTIN) 300 MG capsule take 1 capsules by mouth every morning and 2 every evening 180 capsule 1  . iron polysaccharides (POLY-IRON 150) 150 MG capsule Take 1 capsule (150 mg total) by mouth 2 (two) times daily. 60 capsule 5  . Macitentan (OPSUMIT) 10 MG TABS Take  1 tablet (10 mg total) by mouth daily. 90 tablet 3  . moxifloxacin (VIGAMOX) 0.5 % ophthalmic solution Place 1 drop into both eyes 3 (three) times daily as needed (allergies).     . multivitamin (THERAGRAN) per tablet Take 1 tablet by mouth daily.     . OXYGEN Inhale 2-3 L into the lungs continuous.     . pantoprazole (PROTONIX) 40 MG tablet Take 1 tablet (40 mg total) by mouth 2 (two) times daily. 60 tablet 1  . potassium chloride (K-DUR) 10 MEQ tablet take 1 tablet by mouth once daily 30 tablet 1  . predniSONE (DELTASONE) 5 MG tablet take 1 tablet by mouth once daily WITH BREAKFAST 30 tablet 0  . Selexipag (UPTRAVI) 200 MCG TABS Take 1 tablet (200 mcg total) by mouth 2 (two) times daily. 60 tablet 11  . sildenafil (REVATIO) 20 MG tablet TAKE 2 TABLETS BY MOUTH THREE TIMES A DAY 180 tablet 2  . spironolactone (ALDACTONE) 25 MG tablet take 1 tablet by mouth once daily 30 tablet 1  . torsemide (DEMADEX) 20 MG tablet take 2 tablets by mouth twice a day 120 tablet 1  . vitamin B-12 (CYANOCOBALAMIN) 500 MCG tablet Take 500 mcg by mouth every morning.      No facility-administered medications prior to visit.  ROS See HPI  Objective:  BP 122/60   Pulse 66   Temp 98.4 F (36.9 C)   Ht _0  (1.702 m)   Wt 141 lb (64 kg)   SpO2 93%   BMI 22.08 kg/m   BP Readings from Last 3 Encounters:  05/31/17 122/60  05/15/17 130/70  04/30/17 (!) 143/70    Wt Readings from Last 3 Encounters:  05/31/17 141 lb (64 kg)  05/15/17 142 lb 8 oz (64.6 kg)  04/30/17 140 lb 4.8 oz (63.6 kg)    Physical Exam  Constitutional: No distress.  HENT:  Head: Normocephalic.  Right Ear: External ear and ear canal normal. No tenderness. No mastoid tenderness. Tympanic membrane is not injected and not erythematous. A middle ear effusion is present. Decreased hearing is noted.  Left Ear: Tympanic membrane, external ear and ear canal normal. No mastoid tenderness. Tympanic membrane is not injected and not  erythematous.  No middle ear effusion. No decreased hearing is noted.  Nose: Nose normal.  Mouth/Throat: Uvula is midline and oropharynx is clear and moist. No oropharyngeal exudate or posterior oropharyngeal erythema.  Neck: Normal range of motion. Neck supple.  Vitals reviewed.   Lab Results  Component Value Date   WBC 6.0 04/19/2017   HGB 12.9 04/19/2017   HCT 40.8 04/19/2017   PLT 270 04/19/2017   GLUCOSE 89 04/19/2017   CHOL 161 06/21/2015   TRIG 85.0 06/21/2015   HDL 59.20 06/21/2015   LDLCALC 85 06/21/2015   ALT 14 03/15/2017   AST 24 03/15/2017   NA 138 04/19/2017   K 3.6 04/19/2017   CL 98 (L) 04/19/2017   CREATININE 1.26 (H) 04/19/2017   BUN 41 (H) 04/19/2017   CO2 27 04/19/2017   TSH 1.67 05/02/2016   INR 0.87 04/19/2017    No results found.  Assessment & Plan:   [name] [name] was seen today for hearing loss.  Diagnoses and all orders for this visit:  Eustachian tube dysfunction, right -     methylPREDNISolone acetate (DEPO-MEDROL) injection 40 mg; Inject 1 mL (40 mg total) into the muscle once. -     methylPREDNISolone (MEDROL DOSEPAK) 4 MG TBPK tablet; Take as directed on package   I am having [name] [name] start on methylPREDNISolone. I am also having her maintain her multivitamin, vitamin B-12, acidophilus, fish oil-omega-3 fatty acids, cholecalciferol, moxifloxacin, OXYGEN, BIOTIN PO, pantoprazole, iron polysaccharides, acetaminophen, azelastine, Cultured Skin Substitute (APLIGRAF EX), gabapentin, Macitentan, spironolactone, sildenafil, potassium chloride, atorvastatin, torsemide, Selexipag, clopidogrel, citalopram, and predniSONE. We administered methylPREDNISolone acetate.  Meds ordered this encounter  Medications  . methylPREDNISolone acetate (DEPO-MEDROL) injection 40 mg  . methylPREDNISolone (MEDROL DOSEPAK) 4 MG TBPK tablet    Sig: Take as directed on package    Dispense:  21 tablet    Refill:  0    Order Specific Question:   Supervising Provider     Answer:   Cassandria Anger [[id number]]    Follow-up: Return if symptoms worsen or fail to improve.  Wilfred Lacy, NP

## 2017-06-03 ENCOUNTER — Other Ambulatory Visit: Payer: Self-pay | Admitting: Internal Medicine

## 2017-06-07 ENCOUNTER — Telehealth: Payer: Self-pay | Admitting: Internal Medicine

## 2017-06-07 NOTE — Telephone Encounter (Signed)
Pt called in and said that she is still having ear pain and has gotten worse.  What does she need to do?  She would like nurse to call her

## 2017-06-07 NOTE — Telephone Encounter (Signed)
[name] advise for pt to come back to the office to be check again or we can refer pt to ENT. Pt wants an appt with [place] and appt is set for Monday.

## 2017-06-10 ENCOUNTER — Encounter: Payer: Self-pay | Admitting: Nurse Practitioner

## 2017-06-10 ENCOUNTER — Ambulatory Visit (INDEPENDENT_AMBULATORY_CARE_PROVIDER_SITE_OTHER): Payer: BLUE CROSS/BLUE SHIELD | Admitting: Nurse Practitioner

## 2017-06-10 ENCOUNTER — Encounter (HOSPITAL_BASED_OUTPATIENT_CLINIC_OR_DEPARTMENT_OTHER): Payer: BLUE CROSS/BLUE SHIELD | Attending: Internal Medicine

## 2017-06-10 VITALS — BP 118/58 | HR 68 | Temp 98.2°F | Ht 67.0 in | Wt 139.0 lb

## 2017-06-10 DIAGNOSIS — L942 Calcinosis cutis: Secondary | ICD-10-CM | POA: Diagnosis not present

## 2017-06-10 DIAGNOSIS — S61200A Unspecified open wound of right index finger without damage to nail, initial encounter: Secondary | ICD-10-CM | POA: Insufficient documentation

## 2017-06-10 DIAGNOSIS — L97812 Non-pressure chronic ulcer of other part of right lower leg with fat layer exposed: Secondary | ICD-10-CM | POA: Diagnosis not present

## 2017-06-10 DIAGNOSIS — L97312 Non-pressure chronic ulcer of right ankle with fat layer exposed: Secondary | ICD-10-CM | POA: Insufficient documentation

## 2017-06-10 DIAGNOSIS — S91001A Unspecified open wound, right ankle, initial encounter: Secondary | ICD-10-CM | POA: Insufficient documentation

## 2017-06-10 DIAGNOSIS — I83222 Varicose veins of left lower extremity with both ulcer of calf and inflammation: Secondary | ICD-10-CM | POA: Diagnosis present

## 2017-06-10 DIAGNOSIS — H6981 Other specified disorders of Eustachian tube, right ear: Secondary | ICD-10-CM | POA: Diagnosis not present

## 2017-06-10 DIAGNOSIS — I7301 Raynaud's syndrome with gangrene: Secondary | ICD-10-CM | POA: Insufficient documentation

## 2017-06-10 DIAGNOSIS — X58XXXA Exposure to other specified factors, initial encounter: Secondary | ICD-10-CM | POA: Insufficient documentation

## 2017-06-10 NOTE — Progress Notes (Signed)
Subjective:  [name], female    DOB: September 12, 1951  Age: 66 y.o. MRN: [id number]  CC: Ear Fullness (ear not better,rattling in right ear)   HPI   Right Ear: complains of persistent fulness in right ear. Complete oral prednisone as prescribed. Denies any ear or head injury.  Outpatient Medications Prior to Visit  Medication Sig Dispense Refill  . acetaminophen (TYLENOL ARTHRITIS PAIN) 650 MG CR tablet Take 1,300 mg by mouth every 8 (eight) hours as needed for pain.     Marland Kitchen acidophilus (RISAQUAD) CAPS Take 1 capsule by mouth every morning.     Marland Kitchen atorvastatin (LIPITOR) 10 MG tablet take 1 tablet by mouth once daily 30 tablet 1  . azelastine (ASTELIN) 0.1 % nasal spray Use 2 sprays in each nostril 2-3 times a day as need 30 mL 2  . BIOTIN PO Take 1 tablet by mouth daily.    . cholecalciferol (VITAMIN D) 1000 UNITS tablet Take 2,000 Units by mouth every evening.     . citalopram (CELEXA) 20 MG tablet Take 20 mg by mouth at bedtime.    . clopidogrel (PLAVIX) 75 MG tablet Take 75 mg by mouth daily.    . Cultured Skin Substitute (APLIGRAF EX) Apply 1 application topically every 14 (fourteen) days.    . fish oil-omega-3 fatty acids 1000 MG capsule Take 1,000 mg by mouth 2 (two) times daily.     Marland Kitchen gabapentin (NEURONTIN) 300 MG capsule take 1 capsules by mouth every morning and 2 every evening 180 capsule 1  . iron polysaccharides (POLY-IRON 150) 150 MG capsule Take 1 capsule (150 mg total) by mouth 2 (two) times daily. 60 capsule 5  . Macitentan (OPSUMIT) 10 MG TABS Take 1 tablet (10 mg total) by mouth daily. 90 tablet 3  . methylPREDNISolone (MEDROL DOSEPAK) 4 MG TBPK tablet Take as directed on package 21 tablet 0  . moxifloxacin (VIGAMOX) 0.5 % ophthalmic solution Place 1 drop into both eyes 3 (three) times daily as needed (allergies).     . multivitamin (THERAGRAN) per tablet Take 1 tablet by mouth daily.     . OXYGEN Inhale 2-3 L into the lungs continuous.     . pantoprazole  (PROTONIX) 40 MG tablet Take 1 tablet (40 mg total) by mouth 2 (two) times daily. 60 tablet 1  . potassium chloride (K-DUR) 10 MEQ tablet take 1 tablet by mouth once daily 30 tablet 1  . predniSONE (DELTASONE) 5 MG tablet take 1 tablet by mouth once daily WITH BREAKFAST 30 tablet 0  . Selexipag (UPTRAVI) 200 MCG TABS Take 1 tablet (200 mcg total) by mouth 2 (two) times daily. 60 tablet 11  . sildenafil (REVATIO) 20 MG tablet TAKE 2 TABLETS BY MOUTH THREE TIMES A DAY 180 tablet 2  . spironolactone (ALDACTONE) 25 MG tablet take 1 tablet by mouth once daily 30 tablet 1  . torsemide (DEMADEX) 20 MG tablet take 2 tablets by mouth twice a day 120 tablet 1  . vitamin B-12 (CYANOCOBALAMIN) 500 MCG tablet Take 500 mcg by mouth every morning.      No facility-administered medications prior to visit.     ROS Review of Systems  Constitutional: Negative for fever.  HENT: Positive for ear pain. Negative for congestion, ear discharge, hearing loss, sinus pain, sore throat and tinnitus.   Neurological: Negative for dizziness, sensory change and headaches.     Objective:  BP (!) 118/58   Pulse 68   Temp 98.2  F (36.8 C)   Ht _0  (1.702 m)   Wt 139 lb (63 kg)   SpO2 94%   BMI 21.77 kg/m   BP Readings from Last 3 Encounters:  06/10/17 (!) 118/58  05/31/17 122/60  05/15/17 130/70    Wt Readings from Last 3 Encounters:  06/10/17 139 lb (63 kg)  05/31/17 141 lb (64 kg)  05/15/17 142 lb 8 oz (64.6 kg)    Physical Exam  HENT:  Right Ear: Tympanic membrane, external ear and ear canal normal. No mastoid tenderness. Tympanic membrane is not injected. No middle ear effusion.  Left Ear: Tympanic membrane, external ear and ear canal normal. No mastoid tenderness. Tympanic membrane is not injected.  No middle ear effusion.  Nose: Nose normal. No mucosal edema or rhinorrhea. Right sinus exhibits no maxillary sinus tenderness and no frontal sinus tenderness. Left sinus exhibits no maxillary sinus  tenderness and no frontal sinus tenderness.  Mouth/Throat: Uvula is midline and oropharynx is clear and moist. No oropharyngeal exudate or posterior oropharyngeal erythema.  Eyes: No scleral icterus.    Lab Results  Component Value Date   WBC 7.8 05/31/2017   HGB 11.8 (L) 05/31/2017   HCT 35.0 (L) 05/31/2017   PLT 304.0 05/31/2017   GLUCOSE 127 (H) 05/31/2017   CHOL 161 06/21/2015   TRIG 85.0 06/21/2015   HDL 59.20 06/21/2015   LDLCALC 85 06/21/2015   ALT 21 05/31/2017   AST 27 05/31/2017   NA 135 05/31/2017   K 4.0 05/31/2017   CL 97 05/31/2017   CREATININE 1.27 (H) 05/31/2017   BUN 50 (H) 05/31/2017   CO2 29 05/31/2017   TSH 1.67 05/02/2016   INR 0.87 04/19/2017    No results found.  Assessment & Plan:   Carrisa was seen today for ear fullness.  Diagnoses and all orders for this visit:  Eustachian tube dysfunction, right   I am having Ms. Prieto maintain her multivitamin, vitamin B-12, acidophilus, fish oil-omega-3 fatty acids, cholecalciferol, moxifloxacin, OXYGEN, BIOTIN PO, pantoprazole, iron polysaccharides, acetaminophen, azelastine, Cultured Skin Substitute (APLIGRAF EX), gabapentin, Macitentan, sildenafil, potassium chloride, atorvastatin, torsemide, Selexipag, clopidogrel, citalopram, predniSONE, methylPREDNISolone, spironolactone, and vitamin B-12.  Meds ordered this encounter  Medications  . vitamin B-12 (CYANOCOBALAMIN) 500 MCG tablet    Sig: Take by mouth.    Follow-up: Return if symptoms worsen or fail to improve.  Wilfred Lacy, NP

## 2017-06-10 NOTE — Patient Instructions (Signed)
She declined referral to ENT. States she already has any ENT and will contact for further evaluation

## 2017-06-18 ENCOUNTER — Other Ambulatory Visit: Payer: Self-pay | Admitting: Internal Medicine

## 2017-06-18 ENCOUNTER — Encounter (HOSPITAL_COMMUNITY): Payer: BLUE CROSS/BLUE SHIELD | Admitting: Internal Medicine

## 2017-06-24 ENCOUNTER — Other Ambulatory Visit (HOSPITAL_COMMUNITY): Payer: Self-pay | Admitting: *Deleted

## 2017-06-24 DIAGNOSIS — I83222 Varicose veins of left lower extremity with both ulcer of calf and inflammation: Secondary | ICD-10-CM | POA: Diagnosis not present

## 2017-06-24 MED ORDER — CLOPIDOGREL BISULFATE 75 MG PO TABS: 75.0000 mg | ORAL_TABLET | Freq: Every day | ORAL | 3 refills | Status: DC

## 2017-06-24 MED ORDER — POTASSIUM CHLORIDE ER 10 MEQ PO TBCR: 10.0000 meq | EXTENDED_RELEASE_TABLET | Freq: Every day | ORAL | 3 refills | Status: DC

## 2017-06-26 ENCOUNTER — Other Ambulatory Visit (HOSPITAL_COMMUNITY): Payer: Self-pay | Admitting: Internal Medicine

## 2017-06-26 ENCOUNTER — Other Ambulatory Visit (HOSPITAL_COMMUNITY): Payer: Self-pay | Admitting: *Deleted

## 2017-06-26 DIAGNOSIS — I779 Disorder of arteries and arterioles, unspecified: Secondary | ICD-10-CM

## 2017-06-26 MED ORDER — ATORVASTATIN CALCIUM 10 MG PO TABS: 10.0000 mg | ORAL_TABLET | Freq: Every day | ORAL | 3 refills | Status: DC

## 2017-06-26 MED ORDER — SPIRONOLACTONE 25 MG PO TABS: 25.0000 mg | ORAL_TABLET | Freq: Every day | ORAL | 3 refills | Status: DC

## 2017-06-26 MED ORDER — TORSEMIDE 20 MG PO TABS: 40.0000 mg | ORAL_TABLET | Freq: Two times a day (BID) | ORAL | 3 refills | Status: DC

## 2017-07-08 ENCOUNTER — Encounter (HOSPITAL_BASED_OUTPATIENT_CLINIC_OR_DEPARTMENT_OTHER): Payer: BLUE CROSS/BLUE SHIELD | Attending: Internal Medicine

## 2017-07-08 DIAGNOSIS — I10 Essential (primary) hypertension: Secondary | ICD-10-CM | POA: Insufficient documentation

## 2017-07-08 DIAGNOSIS — S61200A Unspecified open wound of right index finger without damage to nail, initial encounter: Secondary | ICD-10-CM | POA: Insufficient documentation

## 2017-07-08 DIAGNOSIS — M341 CR(E)ST syndrome: Secondary | ICD-10-CM | POA: Insufficient documentation

## 2017-07-08 DIAGNOSIS — L97812 Non-pressure chronic ulcer of other part of right lower leg with fat layer exposed: Secondary | ICD-10-CM | POA: Insufficient documentation

## 2017-07-08 DIAGNOSIS — X58XXXA Exposure to other specified factors, initial encounter: Secondary | ICD-10-CM | POA: Diagnosis not present

## 2017-07-08 DIAGNOSIS — I2510 Atherosclerotic heart disease of native coronary artery without angina pectoris: Secondary | ICD-10-CM | POA: Insufficient documentation

## 2017-07-08 DIAGNOSIS — L97312 Non-pressure chronic ulcer of right ankle with fat layer exposed: Secondary | ICD-10-CM | POA: Insufficient documentation

## 2017-07-08 DIAGNOSIS — I87331 Chronic venous hypertension (idiopathic) with ulcer and inflammation of right lower extremity: Secondary | ICD-10-CM | POA: Diagnosis not present

## 2017-07-08 DIAGNOSIS — I7300 Raynaud's syndrome without gangrene: Secondary | ICD-10-CM | POA: Diagnosis not present

## 2017-07-10 ENCOUNTER — Other Ambulatory Visit: Payer: Self-pay | Admitting: Internal Medicine

## 2017-07-10 DIAGNOSIS — J3489 Other specified disorders of nose and nasal sinuses: Secondary | ICD-10-CM | POA: Insufficient documentation

## 2017-07-12 NOTE — Telephone Encounter (Signed)
Pt is needing refill on this med

## 2017-07-15 MED ORDER — CITALOPRAM HYDROBROMIDE 20 MG PO TABS: 20.0000 mg | ORAL_TABLET | Freq: Every day | ORAL | 0 refills | Status: DC

## 2017-07-15 NOTE — Telephone Encounter (Signed)
Pt would like a refill of this med. She talked to Dr. Gillermina Hu office and they have no record of prescribing this for her. She has 2 days left. Please advise

## 2017-07-15 NOTE — Telephone Encounter (Signed)
Please advise

## 2017-07-15 NOTE — Addendum Note (Signed)
Addended by: Mauricio Po D on: 07/15/2017 09:53 PM   Modules accepted: Orders

## 2017-07-15 NOTE — Telephone Encounter (Signed)
Medication refilled.

## 2017-07-22 ENCOUNTER — Other Ambulatory Visit (HOSPITAL_COMMUNITY): Payer: BLUE CROSS/BLUE SHIELD

## 2017-07-22 ENCOUNTER — Encounter: Payer: Self-pay | Admitting: Emergency Medicine

## 2017-07-22 ENCOUNTER — Other Ambulatory Visit (INDEPENDENT_AMBULATORY_CARE_PROVIDER_SITE_OTHER): Payer: BLUE CROSS/BLUE SHIELD

## 2017-07-22 ENCOUNTER — Ambulatory Visit (INDEPENDENT_AMBULATORY_CARE_PROVIDER_SITE_OTHER): Payer: BLUE CROSS/BLUE SHIELD | Admitting: Emergency Medicine

## 2017-07-22 DIAGNOSIS — I272 Pulmonary hypertension, unspecified: Secondary | ICD-10-CM | POA: Diagnosis not present

## 2017-07-22 DIAGNOSIS — J849 Interstitial pulmonary disease, unspecified: Secondary | ICD-10-CM

## 2017-07-22 DIAGNOSIS — I2729 Other secondary pulmonary hypertension: Secondary | ICD-10-CM

## 2017-07-22 DIAGNOSIS — I87331 Chronic venous hypertension (idiopathic) with ulcer and inflammation of right lower extremity: Secondary | ICD-10-CM | POA: Diagnosis not present

## 2017-07-22 DIAGNOSIS — L940 Localized scleroderma [morphea]: Secondary | ICD-10-CM | POA: Diagnosis not present

## 2017-07-22 LAB — COMPREHENSIVE METABOLIC PANEL
ALT: 16 U/L (ref 0–35)
AST: 21 U/L (ref 0–37)
Albumin: 4.2 g/dL (ref 3.5–5.2)
Alkaline Phosphatase: 74 U/L (ref 39–117)
BUN: 43 mg/dL — ABNORMAL HIGH (ref 6–23)
CO2: 31 mEq/L (ref 19–32)
Calcium: 9.6 mg/dL (ref 8.4–10.5)
Chloride: 98 mEq/L (ref 96–112)
Creatinine, Ser: 1.57 mg/dL — ABNORMAL HIGH (ref 0.40–1.20)
GFR: 34.84 mL/min — ABNORMAL LOW (ref >60.00)
Glucose, Bld: 103 mg/dL — ABNORMAL HIGH (ref 70–99)
Potassium: 4.4 mEq/L (ref 3.5–5.1)
Sodium: 138 mEq/L (ref 135–145)
Total Bilirubin: 0.4 mg/dL (ref 0.2–1.2)
Total Protein: 7.3 g/dL (ref 6.0–8.3)

## 2017-07-22 LAB — CBC
HCT: 36.5 % (ref 36.0–46.0)
Hemoglobin: 12 g/dL (ref 12.0–15.0)
MCHC: 32.9 g/dL (ref 30.0–36.0)
MCV: 100.4 fl — ABNORMAL HIGH (ref 78.0–100.0)
Platelets: 323 10*3/uL (ref 150.0–400.0)
RBC: 3.64 Mil/uL — ABNORMAL LOW (ref 3.87–5.11)
RDW: 14.9 % (ref 11.5–15.5)
WBC: 7.5 10*3/uL (ref 4.0–10.5)

## 2017-07-22 NOTE — Patient Instructions (Signed)
Please continue sildenifil and macetentan as you are taking them  Continue prednisone 5 mg daily Continue oxygen at 2-3 L/m We will plan to repeat your CT scan of the chest in May 2019. If you develop any changes, new symptoms, then we will perform the scan earlier. Follow with Dr Lamonte Sakai in 3 months or sooner if you have any problems.

## 2017-07-22 NOTE — Assessment & Plan Note (Signed)
Currently stable on 5 mg prednisone daily

## 2017-07-22 NOTE — Assessment & Plan Note (Signed)
Unfortunately she was unable to tolerate the selexapag. Too many side effects. We will continue her sildenafil, macetentan. She will follow-up with the advanced heart failure clinic to discuss timing of any repeat echocardiogram, 6 minute walk.

## 2017-07-22 NOTE — Assessment & Plan Note (Signed)
Stable exam, stable exertional tolerance. I believe we can defer any repeat CT scan until the two-year mark which would be May 2019

## 2017-07-22 NOTE — Assessment & Plan Note (Signed)
Continue oxygen to 3 L/m.

## 2017-07-22 NOTE — Progress Notes (Signed)
Subjective:    [name], female    DOB: 10/03/1951, 66 y.o.   MRN: [id number] HPI 66 yo woman, never smoker, hx of scleroderma, Raynaud's, non-Hodgkins lymphoma (treated '10), HTN, PAH originally dx in [place] and treated, but meds d/c'd when she improved. Has been seen in by Dr [name], had R heart cath as below in 03/2012. She has been restarted on therapy, seen both in [place] and at [facility] >> [facility]acleer + Revatio + Veletri (since 2/'14). She has felt worse on IV therapy, progressive as it titrated up.  Dr Gilles Chiquito scaled back her veletri for this reason. She has had progressive dyspnea - was just started prednisone 245m qd. She has benefited significantly, better exertional tolerance and SpO2.    Underwent RHC on 04/18/12 as below:  RA = 9  RV = 75/11/14  PA = 71/25 (44)  PCW = 4  Fick cardiac output/index = 3.0/1.7  Thermo CO/CI = 2.7/1.5  PVR = 23.5 [place]  FA sat = 79% on room air (checked 3 times). Sat with finger probe on air 94%  PA sat = 41%, 43%  MW 04/18/12 880 feet .   ROV 05/02/16 -- patient with a history of scleroderma on low-dose prednisone and secondary primary hypertension. Also with a history of non-Hodgkin's lymphoma that has been in remission since 2010. She had an echocardiogram on 05/01/16 and have reviewed the results. This showed a stable left ventricular ejection fraction, mild flattening of the intermittent radicular septum and a moderately dilated RV with decreased systolic function. Her PA systolic pressure was estimated at 75 mmHg. Her macitentan was continued sildenafil was changed to Adcirca 44mdaily (not yet available from the pharmacy).   She has been overall stable, although she has been dealing with the stress of her husband being sick. She tells me that she has increased her home O2 to 3L/min, is on 2L/min pulsed on POC. 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 As of our last visit she was on macetentan and was about to change from sildenafil to AdcCox Communicationshe made the switch but did not tolerate so she is back to sildenifil 15m59md. She's also been maintained on prednisone, current dose is 45mg 68mly. She is followed by Dr EdwarOletta LamasI and Dr Early for her vascular disease. She is to have angiography next month to evaluate LE's. Last TTE was May 2017.   66 Acute OV 01/23/17 -- this is an acute office visit. Patient has a history of secondary pulmonary hypertension in setting of scleroderma. Has been on sildenifil and and macetentan, prednisone 45mg. 30m was treated for bronchitis 10/31/16 with brief increase in pred to 10mg, 71mnex. She is now having more head congestion, associated with cough esp at night. She is coughing up clear to white mucous. No fever.   ROV 04/23/17 -- this is a follow-up visit for patient with a history of scleroderma on chronic prednisone, secondary pulmonary hypertension that is currently managed on macetentan and sildenifil. She underwent r heart cath 4/20 that I have reviewed, as below. Recommendation made to try addition of selexipag to her current regimen. She reports that her 66 breathing is stable, she is able to exert some. She feels at baseline. Turns her o2 to 3l/min with exertion. Her joints  are sore.   ROV 68/23/18 -- this follow-up visit for patient with a history of scleroderma. She has secondary pulmonary hypertension that we have managed with sildenafil, macetentan. She added selexipag in May 2018. She experienced headache, has had abdominal discomfort and diarrhea - she had to stop it after about 2-3 weeks. Her last chest x-ray was on 10/31/16 and was stable. Stable on prednisone 68m qd. Was treated with a higher dose methylpred taper for ear fullness. Her exertional tolerance is stable. She uses her o2 at 2-3L/min.  She does not have a 6 minute walk or TTE scheduled at this time.    04/19/17 --  RA = 4 RV = 80/8 PA = 79/17 (43) PCW = 6 Fick cardiac output/index = 4.1/2.4 PVR = 9.0 WU Ao sat = 94% PA sat = 59%, 62%  No results for input(s): HGB, HCT, WBC, PLT in the last 168 hours.    Review of Systems  Constitutional: Negative for appetite change, fever and unexpected weight change.  HENT: Negative for congestion, dental problem, ear pain, nosebleeds, postnasal drip, rhinorrhea, sinus pressure, sneezing, sore throat and trouble swallowing.   Eyes: Negative for redness and itching.  Respiratory: Positive for shortness of breath. Negative for cough, chest tightness and wheezing.   Cardiovascular: Negative for palpitations and leg swelling.  Gastrointestinal: Negative for nausea and vomiting.  Genitourinary: Negative for dysuria.  Musculoskeletal: Negative for joint swelling.  Skin: Negative for rash.  Neurological: Negative for headaches.  Hematological: Does not bruise/bleed easily.  Psychiatric/Behavioral: Negative for dysphoric mood. The patient is not nervous/anxious.        Objective:   Physical Exam Vitals:   07/22/17 1519  BP: 130/60  Pulse: 67  SpO2: 97%  Weight: 143 lb 12.8 oz (65.2 kg)  Height: 5' 7.5" (1.715 m)   Gen: Pleasant, thin, in no distress,  normal affect on pulsed nasal cannula O2  ENT: No lesions,  mouth clear,  oropharynx clear, no postnasal drip  Neck: No JVD, no TMG, no carotid bruits  Lungs: No use of accessory muscles, few bibasilar insp crackles  Cardiovascular: RRR, heart sounds normal, no murmur or gallops, trace peripheral edema  Musculoskeletal: No deformities, no cyanosis or clubbing  Neuro: alert, non focal  Skin: significant changes from scleroderma on arms, hands, fingers; right lower extremity wound is wrapped and dressed      Assessment & Plan:  Pulmonary hypertension associated with systemic disorder Unfortunately she was unable  to tolerate the selexapag. Too many side effects. We will continue her sildenafil, macetentan. She will follow-up with the advanced heart failure clinic to discuss timing of any repeat echocardiogram, 6 minute walk.  Chronic respiratory failure (HCC) Continue oxygen to 3 L/m.  Scleroderma, circumscribed or localized Currently stable on 5 mg prednisone daily  ILD (interstitial lung disease) [place] Stable exam, stable exertional tolerance. I believe we can defer any repeat CT scan until the two-year mark which would be May 2019  RBaltazar Apo MD, PhD 07/22/2017, 3:39 PM [facility] [phone] if no answer [phone]

## 2017-07-23 ENCOUNTER — Ambulatory Visit: Payer: BLUE CROSS/BLUE SHIELD | Admitting: Emergency Medicine

## 2017-07-29 DIAGNOSIS — I87331 Chronic venous hypertension (idiopathic) with ulcer and inflammation of right lower extremity: Secondary | ICD-10-CM | POA: Diagnosis not present

## 2017-08-07 ENCOUNTER — Other Ambulatory Visit: Payer: Self-pay | Admitting: Dermatology

## 2017-08-12 ENCOUNTER — Encounter (HOSPITAL_BASED_OUTPATIENT_CLINIC_OR_DEPARTMENT_OTHER): Payer: BLUE CROSS/BLUE SHIELD | Attending: Internal Medicine

## 2017-08-12 DIAGNOSIS — I7301 Raynaud's syndrome with gangrene: Secondary | ICD-10-CM | POA: Diagnosis not present

## 2017-08-12 DIAGNOSIS — I87331 Chronic venous hypertension (idiopathic) with ulcer and inflammation of right lower extremity: Secondary | ICD-10-CM | POA: Diagnosis not present

## 2017-08-12 DIAGNOSIS — I10 Essential (primary) hypertension: Secondary | ICD-10-CM | POA: Insufficient documentation

## 2017-08-12 DIAGNOSIS — L97322 Non-pressure chronic ulcer of left ankle with fat layer exposed: Secondary | ICD-10-CM | POA: Diagnosis not present

## 2017-08-12 DIAGNOSIS — L98499 Non-pressure chronic ulcer of skin of other sites with unspecified severity: Secondary | ICD-10-CM | POA: Diagnosis not present

## 2017-08-12 DIAGNOSIS — L97812 Non-pressure chronic ulcer of other part of right lower leg with fat layer exposed: Secondary | ICD-10-CM | POA: Insufficient documentation

## 2017-08-14 ENCOUNTER — Ambulatory Visit (HOSPITAL_COMMUNITY)
Admission: RE | Admit: 2017-08-14 | Discharge: 2017-08-14 | Disposition: A | Discharge status: Home / Self Care | Payer: BLUE CROSS/BLUE SHIELD | Source: Ambulatory Visit | Attending: Internal Medicine | Admitting: Internal Medicine

## 2017-08-14 VITALS — BP 134/50 | HR 90 | Wt 141.0 lb

## 2017-08-14 DIAGNOSIS — I2729 Other secondary pulmonary hypertension: Secondary | ICD-10-CM

## 2017-08-14 DIAGNOSIS — J849 Interstitial pulmonary disease, unspecified: Secondary | ICD-10-CM | POA: Insufficient documentation

## 2017-08-14 DIAGNOSIS — Z9981 Dependence on supplemental oxygen: Secondary | ICD-10-CM | POA: Insufficient documentation

## 2017-08-14 DIAGNOSIS — Z881 Allergy status to other antibiotic agents status: Secondary | ICD-10-CM | POA: Insufficient documentation

## 2017-08-14 DIAGNOSIS — I2721 Secondary pulmonary arterial hypertension: Secondary | ICD-10-CM | POA: Insufficient documentation

## 2017-08-14 DIAGNOSIS — Z882 Allergy status to sulfonamides status: Secondary | ICD-10-CM | POA: Insufficient documentation

## 2017-08-14 DIAGNOSIS — Z880 Allergy status to penicillin: Secondary | ICD-10-CM | POA: Diagnosis not present

## 2017-08-14 DIAGNOSIS — I10 Essential (primary) hypertension: Secondary | ICD-10-CM | POA: Diagnosis not present

## 2017-08-14 DIAGNOSIS — Z85820 Personal history of malignant melanoma of skin: Secondary | ICD-10-CM | POA: Insufficient documentation

## 2017-08-14 DIAGNOSIS — M349 Systemic sclerosis, unspecified: Secondary | ICD-10-CM | POA: Insufficient documentation

## 2017-08-14 DIAGNOSIS — L98499 Non-pressure chronic ulcer of skin of other sites with unspecified severity: Secondary | ICD-10-CM | POA: Diagnosis not present

## 2017-08-14 DIAGNOSIS — Z5111 Encounter for antineoplastic chemotherapy: Secondary | ICD-10-CM | POA: Insufficient documentation

## 2017-08-14 DIAGNOSIS — Z8619 Personal history of other infectious and parasitic diseases: Secondary | ICD-10-CM | POA: Insufficient documentation

## 2017-08-14 DIAGNOSIS — I739 Peripheral vascular disease, unspecified: Secondary | ICD-10-CM | POA: Diagnosis not present

## 2017-08-14 DIAGNOSIS — Z7902 Long term (current) use of antithrombotics/antiplatelets: Secondary | ICD-10-CM | POA: Insufficient documentation

## 2017-08-14 DIAGNOSIS — Z8572 Personal history of non-Hodgkin lymphomas: Secondary | ICD-10-CM | POA: Diagnosis not present

## 2017-08-14 NOTE — Patient Instructions (Signed)
Echocardiogram and follow up in 4 Months

## 2017-08-14 NOTE — Progress Notes (Signed)
PULMONARY HYPERTENSION CLINIC  [name], female   DOB: 1951/05/05, 66 y.o.   MRN: [id number] Primary Cardiologist: Dr. [name] Pulmonologist: Dr. Lamonte Sakai  HPI: [name] is a 66 y.o. 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 We saw her initially in 2013.    PAH Meds: 1. started on IV epoprostenol (Veletri) in 01/2013. She had intolerable side effects with Veletri.  Admitted to [facility] 12/17/13 through 12/19 for Veletri wean and Tyvaso initiation. 2.  Stopped Tyvaso in 2015. 3. February 2016 switched bosentan to Macitentan 4. Now on Macitentan 10 and sildenafil 40 tid (did not tolerate 60 tid or Adcirca).  5. Selexipag added 5/18 - stopped due to SEs   Here for f/u: Had to stop selexipag due to side effects and flu-like symptoms. Feels ok. Gets around and able to do ADLs. LE wounds are healing.  Still with severe neuropathy. Continues with wearing O2. Denies edema.   Studies   6 MW 04/18/12 880 feet 6 MW 11/10/12 650 feet. Limited by legs. Sats down to 85% on 5L (sats at rest 95%)  6 MW 3/15 915 feet (279 m) 6 MW 2/16 930 feet (283 m)  Echo 10/13 LV EF 65-70%. RV severely enlarged/HK severe TR. RVSP 73mHg Echo (10/15) with EF 60-65%, D-shaped interventricular septum, moderately dilated RV with moderately decreased systolic function, PA systolic pressure 72 mmHg, IVC not dilated.  Echo (5/17) with EF 65-70%, Mildly flattened septum, severely dilated RV with moderately decreased systolic function, PA systolic pressure 68 mmHg, IVC not dilated.   RHC 04/19/17  RA = 4 RV = 80/8 PA = 79/17 (43) PCW = 6 Fick cardiac output/index = 4.1/2.4 PVR = 9.0 WU Ao sat = 94% PA sat = 59%, 62%  Assessment:  1. Moderate PAH   RHC 04/18/12  RA = 9  RV = 75/11/14  PA = 71/25 (44)  PCW = 4  Fick cardiac output/index = 3.0/1.7  Thermo  CO/CI = 2.7/1.5  PVR = 13.3 Woods  FA sat = 79% on room air (checked 3 times). Sat with finger probe on air 94%  PA sat = 41%, 43%   RHC 11/14/12  RA = 14  RV = 84/8/20  PA = 83/29 (50)  PCW = 10  Fick cardiac output/index = 4.5/2.5  Thermo CO/CI = 3.8/2.2  PVR = 8.9 Woods (Fick)  FA sat = 86% on 3L  PA sat = 55%, 57  ABI 4/15: R 0.86 L 1.0   Labs (3/15): K 3.9, creatinine 1.0, LFTs normal Labs (05/17/14): K 3.4 Cr 1.1 Bun 40 LFTs ok  Labs (6/15): K 4.3, creatinine 1.15 Labs (8/15): creatinine 1.4 Labs (10/15): LFTs normal Labs (2/16): LFTs normal  Review of Systems: All pertinent positives and negatives as in HPI, otherwise negative.   Past Medical History  Diagnosis Date  . History of chicken pox   . Hypertension   . Sclerodermia generalized ([facility]   . Non Hodgkin's lymphoma ([facility]     Treated with chemo 2010, felt to be cured.  . Melanoma ([place] 1999  . Peripheral arterial occlusive disease ([facility]   . 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[facility]   . DOE (dyspnea on exertion)   . PAH (pulmonary artery hypertension) ([facility]   . Epistaxis   . Pulmonary hypertension ([facility]     Prior to Admission medications   Medication  Sig Start Date End Date Taking? Authorizing Provider  acetaminophen (TYLENOL ARTHRITIS PAIN) 650 MG CR tablet Take 1,300 mg by mouth every 8 (eight) hours as needed for pain.    Yes [provider]  acidophilus (RISAQUAD) CAPS Take 1 capsule by mouth every morning.    Yes [provider]  atorvastatin (LIPITOR) 10 MG tablet take 1 tablet by mouth once daily 04/29/17  Yes Hoyt Koch, MD  azelastine (ASTELIN) 0.1 % nasal spray Use 2 sprays in each nostril 2-3 times a day as need 01/23/17 01/23/18 Yes Byrum, Rose Fillers, MD  BIOTIN PO Take 1 tablet by mouth daily.   Yes [provider]  cholecalciferol (VITAMIN D) 1000 UNITS tablet Take 2,000 Units by mouth every evening.    Yes [provider]  citalopram (CELEXA) 20 MG tablet Take 20  mg by mouth at bedtime.   Yes [provider]  clopidogrel (PLAVIX) 75 MG tablet Take 75 mg by mouth daily.   Yes [provider]  Cultured Skin Substitute (APLIGRAF EX) Apply 1 application topically every 14 (fourteen) days.   Yes [provider]  fish oil-omega-3 fatty acids 1000 MG capsule Take 1,000 mg by mouth 2 (two) times daily.    Yes [provider]  gabapentin (NEURONTIN) 300 MG capsule take 1 capsules by mouth every morning and 2 every evening 03/07/17  Yes Hoyt Koch, MD  iron polysaccharides (POLY-IRON 150) 150 MG capsule Take 1 capsule (150 mg total) by mouth 2 (two) times daily. 10/01/16  Yes Hoyt Koch, MD  Macitentan (OPSUMIT) 10 MG TABS Take 1 tablet (10 mg total) by mouth daily. 03/26/17  Yes Bensimhon, Shaune Pascal, MD  moxifloxacin (VIGAMOX) 0.5 % ophthalmic solution Place 1 drop into both eyes 3 (three) times daily as needed (allergies).    Yes [provider]  multivitamin [facility]) per tablet Take 1 tablet by mouth daily.    Yes [provider]  OXYGEN Inhale 2-3 L into the lungs continuous.    Yes [provider]  pantoprazole (PROTONIX) 40 MG tablet Take 1 tablet (40 mg total) by mouth 2 (two) times daily. 05/08/16  Yes Robbie Lis, MD  potassium chloride (K-DUR) 10 MEQ tablet take 1 tablet by mouth once daily 04/15/17  Yes Hoyt Koch, MD  predniSONE (DELTASONE) 5 MG tablet take 1 tablet by mouth once daily WITH BREAKFAST 04/17/17  Yes Hoyt Koch, MD  Selexipag (UPTRAVI) 200 MCG TABS Take 1 tablet (200 mcg total) by mouth 2 (two) times daily. 05/06/17  Yes Bensimhon, Shaune Pascal, MD  sildenafil (REVATIO) 20 MG tablet TAKE 2 TABLETS BY MOUTH THREE TIMES A DAY 04/04/17  Yes Bensimhon, Shaune Pascal, MD  spironolactone (ALDACTONE) 25 MG tablet take 1 tablet by mouth once daily 04/01/17  Yes Hoyt Koch, MD  torsemide [facility]) 20 MG tablet take 2 tablets by mouth twice a day 04/29/17   Yes Hoyt Koch, MD  vitamin B-12 (CYANOCOBALAMIN) 500 MCG tablet Take 500 mcg by mouth every morning.    Yes [provider]     Allergies  Allergen Reactions  . Sulfa Antibiotics     Severe rash with significant peeling of face. No airway involvement per patient  . Levaquin [Levofloxacin In D5w] Other (See Comments)    Other reaction(s): Other (See Comments) FLU LIKE SYMPTOM  . Penicillins Rash     Filed Vitals:   05/01/16 1357  BP: 138/70  Pulse: 75  Weight: 135 lb (61.236 kg)    PHYSICAL EXAM: General: No acute distress No respiratory difficulty HEENT: normal anicteric Neck: supple. JVP 6-7. Carotids 2+ bilat; no bruits. No lymphadenopathy or thryomegaly appreciated. Cor: PMI nondisplaced. RRR 2/6 TR + RV lift with prominent P2 Lungs: clear on 4L decreased BS throughout n wheeze Abdomen: soft NT/ND  No hepatosplenomegaly. No bruits or masses. Good bowel sounds. 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 + gouty nodules. RLE wrapped. No edema on left . Dry gangrene on R index finger tip Neuro: alert & oriented x 3, cranial nerves grossly intact. moves all 4 extremities w/o difficulty. Affect pleasant  ASSESSMENT & PLAN:  1. 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  Unable to tolerate IV epoprostenol and with no improvement on Tyvaso.  She stopped Tyvaso and 6 minute walkshowed no deterioration.  She remains on Revatio and macitentan.  WHO class II-III dyspnea.  Last echo showed moderately dilated and moderately dysfunctional RV with persistent severe pulmonary hypertension.   - Recent RHC reviewed< has now failed Selexipag as well as uptitration of her other PAH meds. Only other option would be to try to switch sildenafil to Adempas but will defer for now given intolerances -Stable NYHA II-III - Volume status ok - Continue home oxygen.  - Unable to do 6MW - Echo next visit to assess degree of RV strain.  2. Scleroderma:  Followed by Dr. Amil Amen. 3. Chronic L Ulceration: Followed by Wound Care.  Improving. They are watching dry gangrene on R 2nd finger closely.  4. PAD: Follows with [facility]. Continue Plavix  5. Interstitial lung disease: In setting of scleroderma.  She is on home oxygen and followed by Dr. Ocie Doyne, MD  10:58 PM

## 2017-08-19 DIAGNOSIS — I87331 Chronic venous hypertension (idiopathic) with ulcer and inflammation of right lower extremity: Secondary | ICD-10-CM | POA: Diagnosis not present

## 2017-08-26 DIAGNOSIS — I87331 Chronic venous hypertension (idiopathic) with ulcer and inflammation of right lower extremity: Secondary | ICD-10-CM | POA: Diagnosis not present

## 2017-08-30 ENCOUNTER — Other Ambulatory Visit: Payer: Self-pay | Admitting: Internal Medicine

## 2017-08-30 ENCOUNTER — Other Ambulatory Visit: Payer: Self-pay | Admitting: Family

## 2017-09-05 ENCOUNTER — Other Ambulatory Visit: Payer: Self-pay | Admitting: Internal Medicine

## 2017-09-05 ENCOUNTER — Other Ambulatory Visit (INDEPENDENT_AMBULATORY_CARE_PROVIDER_SITE_OTHER): Payer: BLUE CROSS/BLUE SHIELD

## 2017-09-05 DIAGNOSIS — I2729 Other secondary pulmonary hypertension: Secondary | ICD-10-CM

## 2017-09-05 LAB — COMPREHENSIVE METABOLIC PANEL
ALT: 13 U/L (ref 0–35)
AST: 21 U/L (ref 0–37)
Albumin: 4.4 g/dL (ref 3.5–5.2)
Alkaline Phosphatase: 61 U/L (ref 39–117)
BUN: 44 mg/dL — ABNORMAL HIGH (ref 6–23)
CO2: 33 mEq/L — ABNORMAL HIGH (ref 19–32)
Calcium: 9.9 mg/dL (ref 8.4–10.5)
Chloride: 94 mEq/L — ABNORMAL LOW (ref 96–112)
Creatinine, Ser: 1.36 mg/dL — ABNORMAL HIGH (ref 0.40–1.20)
GFR: 41.1 mL/min — ABNORMAL LOW (ref >60.00)
Glucose, Bld: 146 mg/dL — ABNORMAL HIGH (ref 70–99)
Potassium: 3.7 mEq/L (ref 3.5–5.1)
Sodium: 138 mEq/L (ref 135–145)
Total Bilirubin: 0.4 mg/dL (ref 0.2–1.2)
Total Protein: 7.8 g/dL (ref 6.0–8.3)

## 2017-09-05 LAB — CBC
HCT: 35 % — ABNORMAL LOW (ref 36.0–46.0)
Hemoglobin: 11.5 g/dL — ABNORMAL LOW (ref 12.0–15.0)
MCHC: 32.8 g/dL (ref 30.0–36.0)
MCV: 101.1 fl — ABNORMAL HIGH (ref 78.0–100.0)
Platelets: 355 10*3/uL (ref 150.0–400.0)
RBC: 3.46 Mil/uL — ABNORMAL LOW (ref 3.87–5.11)
RDW: 14.2 % (ref 11.5–15.5)
WBC: 7.1 10*3/uL (ref 4.0–10.5)

## 2017-09-09 ENCOUNTER — Encounter (HOSPITAL_BASED_OUTPATIENT_CLINIC_OR_DEPARTMENT_OTHER): Payer: BLUE CROSS/BLUE SHIELD | Attending: Internal Medicine

## 2017-09-09 DIAGNOSIS — L97819 Non-pressure chronic ulcer of other part of right lower leg with unspecified severity: Secondary | ICD-10-CM | POA: Diagnosis not present

## 2017-09-09 DIAGNOSIS — L97312 Non-pressure chronic ulcer of right ankle with fat layer exposed: Secondary | ICD-10-CM | POA: Diagnosis not present

## 2017-09-09 DIAGNOSIS — I87331 Chronic venous hypertension (idiopathic) with ulcer and inflammation of right lower extremity: Secondary | ICD-10-CM | POA: Diagnosis not present

## 2017-09-09 DIAGNOSIS — L942 Calcinosis cutis: Secondary | ICD-10-CM | POA: Diagnosis not present

## 2017-09-09 DIAGNOSIS — I7301 Raynaud's syndrome with gangrene: Secondary | ICD-10-CM | POA: Insufficient documentation

## 2017-09-09 DIAGNOSIS — I83222 Varicose veins of left lower extremity with both ulcer of calf and inflammation: Secondary | ICD-10-CM | POA: Diagnosis not present

## 2017-09-09 DIAGNOSIS — L97812 Non-pressure chronic ulcer of other part of right lower leg with fat layer exposed: Secondary | ICD-10-CM | POA: Insufficient documentation

## 2017-09-09 DIAGNOSIS — S61401A Unspecified open wound of right hand, initial encounter: Secondary | ICD-10-CM | POA: Insufficient documentation

## 2017-09-09 DIAGNOSIS — X58XXXA Exposure to other specified factors, initial encounter: Secondary | ICD-10-CM | POA: Diagnosis not present

## 2017-09-10 ENCOUNTER — Encounter: Payer: Self-pay | Admitting: Internal Medicine

## 2017-09-10 ENCOUNTER — Ambulatory Visit (INDEPENDENT_AMBULATORY_CARE_PROVIDER_SITE_OTHER): Payer: BLUE CROSS/BLUE SHIELD | Admitting: Internal Medicine

## 2017-09-10 VITALS — BP 112/62 | HR 71 | Temp 98.4°F | Ht 67.5 in | Wt 139.0 lb

## 2017-09-10 DIAGNOSIS — I779 Disorder of arteries and arterioles, unspecified: Secondary | ICD-10-CM

## 2017-09-10 DIAGNOSIS — Z23 Encounter for immunization: Secondary | ICD-10-CM | POA: Diagnosis not present

## 2017-09-10 DIAGNOSIS — F329 Major depressive disorder, single episode, unspecified: Secondary | ICD-10-CM | POA: Diagnosis not present

## 2017-09-10 DIAGNOSIS — I96 Gangrene, not elsewhere classified: Secondary | ICD-10-CM | POA: Diagnosis not present

## 2017-09-10 DIAGNOSIS — T887XXA Unspecified adverse effect of drug or medicament, initial encounter: Secondary | ICD-10-CM | POA: Diagnosis not present

## 2017-09-10 DIAGNOSIS — T50905A Adverse effect of unspecified drugs, medicaments and biological substances, initial encounter: Secondary | ICD-10-CM

## 2017-09-10 MED ORDER — TRAMADOL HCL 50 MG PO TABS: 50.0000 mg | ORAL_TABLET | Freq: Three times a day (TID) | ORAL | 0 refills | Status: DC | PRN

## 2017-09-10 MED ORDER — CITALOPRAM HYDROBROMIDE 20 MG PO TABS: 20.0000 mg | ORAL_TABLET | Freq: Every day | ORAL | 1 refills | Status: DC

## 2017-09-10 NOTE — Patient Instructions (Signed)
We have given you the tramadol prescription to take at night time.   Try taking 1 gabapentin in the morning.

## 2017-09-11 DIAGNOSIS — I96 Gangrene, not elsewhere classified: Secondary | ICD-10-CM | POA: Insufficient documentation

## 2017-09-11 DIAGNOSIS — T50905A Adverse effect of unspecified drugs, medicaments and biological substances, initial encounter: Secondary | ICD-10-CM | POA: Insufficient documentation

## 2017-09-11 NOTE — Assessment & Plan Note (Signed)
Suspect withdrawal from gabapentin and celexa reduction at the same time precipitated her hot flashes. She is having more neuropathic pain in her legs and feet since no gabapentin. She will resume 1 pill in the morning (previously 3 per day) to see if this stops the hot flashes and helps with pain. She stopped as it was aggravating the pain in her hand.

## 2017-09-11 NOTE — Assessment & Plan Note (Signed)
Okay to stay at celexa 20 mg daily from 40 mg daily if mood is controlled. We talked about the need to call if she notices worsening symptoms and we can increase back to 40 mg daily.

## 2017-09-11 NOTE — Assessment & Plan Note (Signed)
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

## 2017-09-11 NOTE — Progress Notes (Signed)
Subjective:    [name], female    DOB: 04/19/1951, 67 y.o.   MRN: [id number]  HPI The patient is a 67 YO female coming in for several concerns including her mood (celexa refilled for 1 pill daily and previously had been taking 2 pills daily per our adjustment, she switched about 1 week ago and has not noticed any difference yet, denies SI/HI and feels well controlled), and hot flashes (stopped gabapentin and decrease in celexa suddenly about 1 week ago and since that time hot and cold spells, denies fever with thermometer, no increasing SOB, cough, chest pains, abdominal pain, nausea, diarrhea, constipation), as well as finger amputating itself (from vasospasm and possible frost bite with her severe rheumatoid disease, painful at this time, has seen vascular and they do not feel intervention will be helpful, has happened to several other fingers from her disease). No other new concerns.   Review of Systems  Constitutional: Positive for activity change and chills. Negative for appetite change, fever and unexpected weight change.  HENT: Negative.   Eyes: Negative.   Respiratory: Negative for cough, chest tightness, shortness of breath and wheezing.   Cardiovascular: Negative for chest pain, palpitations and leg swelling.  Gastrointestinal: Negative.   Musculoskeletal: Positive for arthralgias and myalgias. Negative for back pain.  Skin: Positive for color change and wound.  Neurological: Positive for numbness. Negative for dizziness, syncope, speech difficulty, weakness, light-headedness and headaches.  Psychiatric/Behavioral: Negative.       Objective:   Physical Exam  Constitutional: She is oriented to person, place, and time. She appears well-developed and well-nourished.  HENT:  Head: Normocephalic and atraumatic.  Eyes: EOM are normal.  Neck: Normal range of motion.  Cardiovascular: Normal rate and regular rhythm.   Pulmonary/Chest: Effort normal and breath sounds  normal. No respiratory distress. She has no wheezes. She has no rales.  On oxygen portable concentrator.  Abdominal: Soft. Bowel sounds are normal. She exhibits no distension. There is no tenderness. There is no rebound.  Musculoskeletal: She exhibits no edema.  Neurological: She is alert and oriented to person, place, and time. Coordination normal.  Skin: Skin is warm and dry.  Several distal joints on the hands are gone, 1 is with dry gangrene on the right hand from distal joint to fingertip and withered, some pain in the proximal joints to touch.   Psychiatric: She has a normal mood and affect.   Vitals:   09/10/17 1319  BP: 112/62  Pulse: 71  Temp: 98.4 F (36.9 C)  TempSrc: Oral  SpO2: 98%  Weight: 139 lb (63 kg)  Height: 5' 7.5" (1.715 m)      Assessment & Plan:  Flu shot given at visit.

## 2017-09-13 ENCOUNTER — Telehealth: Payer: Self-pay | Admitting: Internal Medicine

## 2017-09-13 NOTE — Telephone Encounter (Signed)
Pt called stating that the prescription that was sent in for her Celexa should be up to two at bedtime. Can this be changed and sent in? She will be due for them in the next few days and can not get a refill on the current on until later in October because it was written for one at bedtime. Please advise.

## 2017-09-16 DIAGNOSIS — I83222 Varicose veins of left lower extremity with both ulcer of calf and inflammation: Secondary | ICD-10-CM | POA: Diagnosis not present

## 2017-09-16 NOTE — Telephone Encounter (Signed)
Called patient and she stated that yes she is taking 1 pill a day, but while you were out someone refilled her RX and refilled it for 1 pill a day. She was still taking 2 pills a day not realizing the change and now she is down to 5 pills and will not make it till October. She wants to know if she can get a RX to hold her over till then.

## 2017-09-16 NOTE — Telephone Encounter (Signed)
Patient contacted and informed that the pharmacy said that insurance would not cover it until the 23rd and that they would fill it for then. They also stated that if she wanted to buy a couple of pills to get her through she could do that out of pocket but that insurance will not cover it until the 23rd. Patient states understanding and is going to contact the pharmacy about being able to buy a couple.

## 2017-09-16 NOTE — Telephone Encounter (Signed)
We talked about during visit that she has been on 1 pill and has not noticed difference and she was going to stay at 1 pill. Did she change her mind?

## 2017-09-16 NOTE — Telephone Encounter (Signed)
Refill was sent in on 11th, can you call pharmacy to fill early?

## 2017-09-21 ENCOUNTER — Other Ambulatory Visit (HOSPITAL_COMMUNITY): Payer: Self-pay | Admitting: Internal Medicine

## 2017-09-30 ENCOUNTER — Encounter (HOSPITAL_BASED_OUTPATIENT_CLINIC_OR_DEPARTMENT_OTHER): Payer: BLUE CROSS/BLUE SHIELD | Attending: Internal Medicine

## 2017-09-30 DIAGNOSIS — L942 Calcinosis cutis: Secondary | ICD-10-CM | POA: Diagnosis not present

## 2017-09-30 DIAGNOSIS — I739 Peripheral vascular disease, unspecified: Secondary | ICD-10-CM | POA: Insufficient documentation

## 2017-09-30 DIAGNOSIS — L97312 Non-pressure chronic ulcer of right ankle with fat layer exposed: Secondary | ICD-10-CM | POA: Diagnosis not present

## 2017-09-30 DIAGNOSIS — X58XXXA Exposure to other specified factors, initial encounter: Secondary | ICD-10-CM | POA: Diagnosis not present

## 2017-09-30 DIAGNOSIS — L97812 Non-pressure chronic ulcer of other part of right lower leg with fat layer exposed: Secondary | ICD-10-CM | POA: Diagnosis not present

## 2017-09-30 DIAGNOSIS — I7301 Raynaud's syndrome with gangrene: Secondary | ICD-10-CM | POA: Insufficient documentation

## 2017-09-30 DIAGNOSIS — S61200A Unspecified open wound of right index finger without damage to nail, initial encounter: Secondary | ICD-10-CM | POA: Insufficient documentation

## 2017-09-30 DIAGNOSIS — I10 Essential (primary) hypertension: Secondary | ICD-10-CM | POA: Diagnosis not present

## 2017-09-30 DIAGNOSIS — I87331 Chronic venous hypertension (idiopathic) with ulcer and inflammation of right lower extremity: Secondary | ICD-10-CM | POA: Diagnosis not present

## 2017-09-30 DIAGNOSIS — M069 Rheumatoid arthritis, unspecified: Secondary | ICD-10-CM | POA: Insufficient documentation

## 2017-10-04 ENCOUNTER — Other Ambulatory Visit: Payer: Self-pay | Admitting: Internal Medicine

## 2017-10-04 ENCOUNTER — Other Ambulatory Visit: Payer: BLUE CROSS/BLUE SHIELD | Admitting: Internal Medicine

## 2017-10-04 DIAGNOSIS — E785 Hyperlipidemia, unspecified: Secondary | ICD-10-CM

## 2017-10-04 DIAGNOSIS — N183 Chronic kidney disease, stage 3 unspecified: Secondary | ICD-10-CM

## 2017-10-04 DIAGNOSIS — F329 Major depressive disorder, single episode, unspecified: Secondary | ICD-10-CM

## 2017-10-04 DIAGNOSIS — J849 Interstitial pulmonary disease, unspecified: Secondary | ICD-10-CM | POA: Diagnosis not present

## 2017-10-04 DIAGNOSIS — D509 Iron deficiency anemia, unspecified: Secondary | ICD-10-CM

## 2017-10-04 DIAGNOSIS — J9611 Chronic respiratory failure with hypoxia: Secondary | ICD-10-CM

## 2017-10-04 DIAGNOSIS — I10 Essential (primary) hypertension: Secondary | ICD-10-CM | POA: Diagnosis not present

## 2017-10-04 DIAGNOSIS — J301 Allergic rhinitis due to pollen: Secondary | ICD-10-CM | POA: Diagnosis not present

## 2017-10-04 DIAGNOSIS — Z0000 Encounter for general adult medical examination without abnormal findings: Secondary | ICD-10-CM

## 2017-10-04 DIAGNOSIS — I779 Disorder of arteries and arterioles, unspecified: Secondary | ICD-10-CM | POA: Diagnosis not present

## 2017-10-04 LAB — COMPLETE METABOLIC PANEL WITH GFR
AG Ratio: 1.3 (calc) (ref 1.0–2.5)
ALT: 13 U/L (ref 6–29)
AST: 22 U/L (ref 10–35)
Albumin: 4 g/dL (ref 3.6–5.1)
Alkaline phosphatase (APISO): 75 U/L (ref 33–130)
BUN/Creatinine Ratio: 32 (calc) — ABNORMAL HIGH (ref 6–22)
BUN: 41 mg/dL — ABNORMAL HIGH (ref 7–25)
CO2: 29 mmol/L (ref 20–32)
Calcium: 9.3 mg/dL (ref 8.6–10.4)
Chloride: 96 mmol/L — ABNORMAL LOW (ref 98–110)
Creat: 1.27 mg/dL — ABNORMAL HIGH (ref 0.50–0.99)
GFR, Est African American: 50 mL/min/{1.73_m2} — ABNORMAL LOW (ref >=60)
GFR, Est Non African American: 43 mL/min/{1.73_m2} — ABNORMAL LOW (ref >=60)
Globulin: 3.1 g/dL (calc) (ref 1.9–3.7)
Glucose, Bld: 87 mg/dL (ref 65–99)
Potassium: 3.7 mmol/L (ref 3.5–5.3)
Sodium: 137 mmol/L (ref 135–146)
Total Bilirubin: 0.5 mg/dL (ref 0.2–1.2)
Total Protein: 7.1 g/dL (ref 6.1–8.1)

## 2017-10-04 LAB — CBC WITH DIFFERENTIAL/PLATELET
Basophils Absolute: 20 cells/uL (ref 0–200)
Basophils Relative: 0.3 %
Eosinophils Absolute: 33 cells/uL (ref 15–500)
Eosinophils Relative: 0.5 %
HCT: 33.2 % — ABNORMAL LOW (ref 35.0–45.0)
Hemoglobin: 11 g/dL — ABNORMAL LOW (ref 11.7–15.5)
Lymphs Abs: 1040 cells/uL (ref 850–3900)
MCH: 31.5 pg (ref 27.0–33.0)
MCHC: 33.1 g/dL (ref 32.0–36.0)
MCV: 95.1 fL (ref 80.0–100.0)
MPV: 9.4 fL (ref 7.5–12.5)
Monocytes Relative: 9.5 %
Neutro Abs: 4791 cells/uL (ref 1500–7800)
Neutrophils Relative %: 73.7 %
Platelets: 357 10*3/uL (ref 140–400)
RBC: 3.49 10*6/uL — ABNORMAL LOW (ref 3.80–5.10)
RDW: 11.8 % (ref 11.0–15.0)
Total Lymphocyte: 16 %
WBC mixed population: 618 cells/uL (ref 200–950)
WBC: 6.5 10*3/uL (ref 3.8–10.8)

## 2017-10-04 LAB — TSH: TSH: 5.22 mIU/L — ABNORMAL HIGH (ref 0.40–4.50)

## 2017-10-04 LAB — LIPID PANEL
Cholesterol: 181 mg/dL (ref <200)
HDL: 74 mg/dL (ref >50)
LDL Cholesterol (Calc): 89 mg/dL (calc)
Non-HDL Cholesterol (Calc): 107 mg/dL (calc) (ref <130)
Total CHOL/HDL Ratio: 2.4 (calc) (ref <5.0)
Triglycerides: 86 mg/dL (ref <150)

## 2017-10-07 DIAGNOSIS — M19041 Primary osteoarthritis, right hand: Secondary | ICD-10-CM | POA: Insufficient documentation

## 2017-10-08 ENCOUNTER — Ambulatory Visit (INDEPENDENT_AMBULATORY_CARE_PROVIDER_SITE_OTHER): Payer: BLUE CROSS/BLUE SHIELD | Admitting: Internal Medicine

## 2017-10-08 ENCOUNTER — Encounter: Payer: Self-pay | Admitting: Internal Medicine

## 2017-10-08 VITALS — BP 110/76 | HR 68 | Temp 97.8°F | Ht 67.0 in | Wt 142.0 lb

## 2017-10-08 DIAGNOSIS — E7849 Other hyperlipidemia: Secondary | ICD-10-CM | POA: Diagnosis not present

## 2017-10-08 DIAGNOSIS — Z7901 Long term (current) use of anticoagulants: Secondary | ICD-10-CM

## 2017-10-08 DIAGNOSIS — Z0000 Encounter for general adult medical examination without abnormal findings: Secondary | ICD-10-CM

## 2017-10-08 DIAGNOSIS — Z8572 Personal history of non-Hodgkin lymphomas: Secondary | ICD-10-CM

## 2017-10-08 DIAGNOSIS — H903 Sensorineural hearing loss, bilateral: Secondary | ICD-10-CM | POA: Diagnosis not present

## 2017-10-08 DIAGNOSIS — Z8719 Personal history of other diseases of the digestive system: Secondary | ICD-10-CM | POA: Diagnosis not present

## 2017-10-08 DIAGNOSIS — M349 Systemic sclerosis, unspecified: Secondary | ICD-10-CM

## 2017-10-08 DIAGNOSIS — Z9981 Dependence on supplemental oxygen: Secondary | ICD-10-CM

## 2017-10-08 DIAGNOSIS — I10 Essential (primary) hypertension: Secondary | ICD-10-CM | POA: Diagnosis not present

## 2017-10-08 DIAGNOSIS — I50812 Chronic right heart failure: Secondary | ICD-10-CM | POA: Diagnosis not present

## 2017-10-08 DIAGNOSIS — I2729 Other secondary pulmonary hypertension: Secondary | ICD-10-CM | POA: Insufficient documentation

## 2017-10-08 DIAGNOSIS — I739 Peripheral vascular disease, unspecified: Secondary | ICD-10-CM

## 2017-10-08 DIAGNOSIS — I779 Disorder of arteries and arterioles, unspecified: Secondary | ICD-10-CM

## 2017-10-08 MED ORDER — LEVOTHYROXINE SODIUM 50 MCG PO TABS: 50.0000 ug | ORAL_TABLET | Freq: Every day | ORAL | 0 refills | Status: DC

## 2017-10-08 NOTE — Progress Notes (Signed)
Subjective:    [name], female    DOB: 02-16-1951, 66 y.o.   MRN: [id number]  HPI First visit for this 66 year old Female with scleroderma and other medical issues including chronic kidney disease stage III, hypertension, depression, severe venous stasis ulceration right leg with arterial insufficiency.  History of non-Hodgkin's lymphoma treated with chemotherapy in 2010 and felt to be cured.  Was diagnosed in September 2010.  History of melanoma.   Hospitalized for gastrointestinal hemorrhage May 2017.  Upper endoscopy showed a possible fungal esophagitis treated with Diflucan by Dr. Oletta Lamas.  Colonoscopy revealed some rectal polyps that were removed.  No obvious source of bleeding.  Aspirin and Plavix were held for several days.  She was started on PPI.  Sees Dr. Fredna Dow for gangrene right index finger and will need partial amputation on near furure. Had partial amputation third finger around 2007.  Can Take DOXYCYCLINE CAPSULES but not tablets.   Rheumatologist is Dr. Amil Amen. Dr. Jeffie Pollock is Cardiologist.  Has high frequency hearing loss. Does not want hearing aids.  Right femoral endarterectomy Dr. [name] Sept 2017. Also saw him for extensive right leg venous ulceration. August 2017. Prior to that had ablation of saphenous vein. Hx severe voenous HTN. Hx arterial insuffiency in [place] with bilateral lower extremity stenting a number of years ago.  Hx non Hodgin's lymphoma primary being in the central nervous system treated with methotrexate and rituximab at [facility] in [place] in 2010. Felt to be cured. Hx melanoma Has chronic ulcer leg treated at wound care center by Dr. Dellia Nims. Tonsillectomy and adenoidectomy 1966.  Right fifth toe removed 1999.  History of basal cell carcinoma right lower extremity.  Lasik eye surgery 2001  Old records indicate she had a 2D echocardiogram in [place] in 2013 showing severe right ventricular enlargement with  moderate RV dysfunction.  Severe regurgitation of the tricuspid valve.  Severe pulmonary hypertension with estimated RVSP 115-120 mmHg.  Diastolic dysfunction and a small underfilled left ventricular cavity with hyperdynamic function.  History of hyperlipidemia treated with Lipitor.  Has had biopsy on cerebellum in the past to diagnose lymphoma.  Social history: She denies smoking.  Reports one alcoholic drink in the evening.  Married.  One daughter.  Husband works for a [employer], clinical.  4-year college degree.  Formerly was a Radio producer.  She is Allergic to Penicillin --it causes a rash, Sulfa antibiotics caused her face to peel with a severe rash, Levaquin caused flulike symptoms.  Doxycycline has caused swelling if she takes tablets but capsules are tolerated just fine.  Vein ligation and stripping May 2013 including the debridement of right lower leg by Dr. [name], endovenous ablation saphenous vein with laser July 2013 involving right greater saphenous vein, endovenous ablation saphenous vein with laser left greater saphenous vein by Dr. [name] October 2013.  Sees Dr. Lamonte Sakai for Pulmonary HTN  Dr. Amil Amen is her Rheumatologist.  Dr. Jeffie Pollock is her cardiologist.  Dr. Oletta Lamas is her gastroenterologist.   Dr. Dellia Nims takes care of her wounds at the wound care center.  Dr. [name] is her vascular surgeon.  Oncologist is Dr. Gustavo Lah at [facility] in [place]   Has been fatigued. Cut Celexa back in the Summer. Has been found to be hypothyroid. Take Neurontin for peripheral neuropathy.  Pt to return for follow up of newly dx hypothyroidism in 6 weeks and will get Pneumococcal 23 then. Tetanus is Up to date.  Had iron infusion per Dr. Oletta Lamas  2017.Hgb is stable at 11 grams with normal MCV  Has stage 3 CKD with Cr 1.27 has improved past few months. Is on statin and lipds are normal  Chronic respiratory failure and interstitial lung disease requiring constant  O2.  Family history: Father with history of lupus and non-Hodgkin's  lymphoma involving the thoracic area died of lymphoma at age 73  Mother with history of hyperlipidemia died of reportedly atherosclerosis..  Daughter with history of sarcoma is deceased at age 32.    Is on chronic anticoagulation with Plavix and aspirin.  History of pneumonia in the remote past.  The  History of allergic rhinitis.        Review of Systems menarche at age 37.  Last menstrual period 2000.  Complains of weakness in her hands.  Numbness in feet.     Objective:   Physical Exam  Constitutional: She is oriented to person, place, and time.  HENT:  Head: Normocephalic and atraumatic.  Right Ear: External ear normal.  Left Ear: External ear normal.  Eyes: Right eye exhibits no discharge. Left eye exhibits no discharge. No scleral icterus.  Neck: Neck supple. No JVD present. No thyromegaly present.  Cardiovascular: Normal rate and normal heart sounds.   No murmur heard. Pulmonary/Chest: Breath sounds normal. She has no wheezes. She has no rales.  Abdominal: Soft. Bowel sounds are normal. She exhibits no distension and no mass. There is no tenderness. There is no rebound and no guarding.  Musculoskeletal: She exhibits no edema.  Lymphadenopathy:    She has no cervical adenopathy.  Neurological: She is alert and oriented to person, place, and time. She has normal reflexes.  Skin: Skin is warm and dry.  Psychiatric: She has a normal mood and affect. Her behavior is normal. Judgment and thought content normal.  Vitals reviewed.         Assessment & Plan:  Severe pulmonary hypertension- O2 dependent  History of scleroderma- seen by Dr, Amil Amen  History of non-Hodgkin's lymphoma affecting the brain  Peripheral vascular disease  Essential hypertension  Hyperlipidemia-lipid panel normal on statin therapy  Chronic anticoagulation  Chronic kidney disease-creatinine ranges between 1.27 and  1.57  History of depression  Neuropathic foot ulcer  History of GI bleed on anticoagulation without obvious source- treated with PPI  Hearing loss - does not want hearing aids   Dry gangrene right hand  Bone density study shows osteoporosis with T score of -2.9.  Have her ask rheumatologist if she is a candidate for Prolia.  Plan: Would like to see patient every 6 months.  Continue same medications.  She is going to have procedure by Dr. Fredna Dow involving a ray resection of the right index finger due to gangrene.  Subjective:   Patient presents for Medicare Annual/Subsequent preventive examination.  Review Past Medical/Family/Social:see above   Risk Factors  Current exercise habits: dsedentary due to pulmonary HTN Dietary issues discussed: yes  Cardiac risk factors:hyperlipidemia  Depression Screen  (Note: if answer to either of the following is "Yes", a more complete depression screening is indicated)   Over the past two weeks, have you felt down, depressed or hopeless? No  Over the past two weeks, have you felt little interest or pleasure in doing things? No Have you lost interest or pleasure in daily life? No Do you often feel hopeless? No Do you cry easily over simple problems? No   Activities of Daily Living  In your present state of health, do you have any difficulty  performing the following activities?:   Driving? No  Managing money? No  Feeding yourself? No  Getting from bed to chair? No  Climbing a flight of stairs? Yes due to SOB Preparing food and eating?: No  Bathing or showering? No  Getting dressed: No  Getting to the toilet? No  Using the toilet:No  Moving around from place to place: yes due to SOB In the past year have you fallen or had a near fall?:No  Are you sexually active? yes Do you have more than one partner? No   Hearing Difficulties: yes Do you often ask people to speak up or repeat themselves? yes Do you experience ringing or noises in  your ears? No  Do you have difficulty understanding soft or whispered voices? yes Do you feel that you have a problem with memory? No Do you often misplace items? no   Home Safety:  Do you have a smoke alarm at your residence? Yes Do you have grab bars in the bathroom? Do you have throw rugs in your house?   Cognitive Testing  Alert? Yes Normal Appearance?Yes  Oriented to person? Yes Place? Yes  Time? Yes  Recall of three objects? Yes  Can perform simple calculations? Yes  Displays appropriate judgment?Yes  Can read the correct time from a watch face?Yes   List the Names of Other Physician/Practitioners you currently use:  See referral list for the physicians patient is currently seeing.     Review of Systems: See above   Objective:     General appearance: Appears stated age and thin B.  Wearing oxygen. Head: Normocephalic, without obvious abnormality, atraumatic  Eyes: conj clear, EOMi PEERLA  Ears: normal TM's and external ear canals both ears  Nose: Nares normal. Septum midline. Mucosa normal. No drainage or sinus tenderness.  Throat: lips, mucosa, and tongue normal; teeth and gums normal  Neck: no adenopathy, no carotid bruit, no JVD, supple, symmetrical, trachea midline and thyroid not enlarged, symmetric, no tenderness/mass/nodules  No CVA tenderness.  Lungs: clear to auscultation bilaterally  Breasts: normal appearance, no masses or tenderness Heart: regular rate and rhythm, S1, S2 normal, no murmur, click, rub or gallop  Abdomen: soft, non-tender; bowel sounds normal; no masses, no organomegaly  Musculoskeletal: ROM normal in all joints, no crepitus, no deformity, Normal muscle strengthen. Back  is symmetric, no curvature. Skin: Skin color, texture, turgor normal. No rashes or lesions  Lymph nodes: Cervical, supraclavicular, and axillary nodes normal.  Neurologic: CN 2 -12 Normal, Normal symmetric reflexes. Normal coordination and gait  Psych: Alert & Oriented  x 3, Mood appear stable.    Assessment:    Annual wellness medicare exam   Plan:    During the course of the visit the patient was educated and counseled about appropriate screening and preventive services including:   Annual mammogram  Annual flu vaccine     Patient Instructions (the written plan) was given to the patient.  Medicare Attestation  I have personally reviewed:  The patient's medical and social history  Their use of alcohol, tobacco or illicit drugs  Their current medications and supplements  The patient's functional ability including ADLs,fall risks, home safety risks, cognitive, and hearing and visual impairment  Diet and physical activities  Evidence for depression or mood disorders  The patient's weight, height, BMI, and visual acuity have been recorded in the chart. I have made referrals, counseling, and provided education to the patient based on review of the above and I have provided  the patient with a written personalized care plan for preventive services.

## 2017-10-09 ENCOUNTER — Ambulatory Visit: Payer: BLUE CROSS/BLUE SHIELD | Admitting: Internal Medicine

## 2017-10-14 ENCOUNTER — Other Ambulatory Visit: Payer: Self-pay | Admitting: Orthopedic Surgery

## 2017-10-14 DIAGNOSIS — I87331 Chronic venous hypertension (idiopathic) with ulcer and inflammation of right lower extremity: Secondary | ICD-10-CM | POA: Diagnosis not present

## 2017-10-15 ENCOUNTER — Encounter (HOSPITAL_COMMUNITY): Admission: RE | Payer: Self-pay | Source: Ambulatory Visit

## 2017-10-15 ENCOUNTER — Ambulatory Visit (HOSPITAL_COMMUNITY)
Admission: RE | Admit: 2017-10-15 | Payer: BLUE CROSS/BLUE SHIELD | Source: Ambulatory Visit | Admitting: Orthopedic Surgery

## 2017-10-15 SURGERY — AMPUTATION DIGIT
Anesthesia: Monitor Anesthesia Care | Laterality: Right

## 2017-10-17 ENCOUNTER — Other Ambulatory Visit: Payer: Self-pay | Admitting: Orthopedic Surgery

## 2017-10-21 ENCOUNTER — Encounter (HOSPITAL_COMMUNITY)
Admission: RE | Admit: 2017-10-21 | Discharge: 2017-10-21 | Disposition: A | Discharge status: Home / Self Care | Payer: BLUE CROSS/BLUE SHIELD | Source: Ambulatory Visit | Attending: Orthopedic Surgery | Admitting: Orthopedic Surgery

## 2017-10-21 ENCOUNTER — Encounter (HOSPITAL_COMMUNITY): Payer: Self-pay

## 2017-10-21 DIAGNOSIS — I10 Essential (primary) hypertension: Secondary | ICD-10-CM | POA: Diagnosis not present

## 2017-10-21 DIAGNOSIS — E039 Hypothyroidism, unspecified: Secondary | ICD-10-CM | POA: Diagnosis not present

## 2017-10-21 DIAGNOSIS — L940 Localized scleroderma [morphea]: Secondary | ICD-10-CM | POA: Diagnosis not present

## 2017-10-21 DIAGNOSIS — Z882 Allergy status to sulfonamides status: Secondary | ICD-10-CM | POA: Diagnosis not present

## 2017-10-21 DIAGNOSIS — Z9981 Dependence on supplemental oxygen: Secondary | ICD-10-CM | POA: Diagnosis not present

## 2017-10-21 DIAGNOSIS — Z881 Allergy status to other antibiotic agents status: Secondary | ICD-10-CM | POA: Diagnosis not present

## 2017-10-21 DIAGNOSIS — I96 Gangrene, not elsewhere classified: Secondary | ICD-10-CM | POA: Diagnosis not present

## 2017-10-21 DIAGNOSIS — Z880 Allergy status to penicillin: Secondary | ICD-10-CM | POA: Diagnosis not present

## 2017-10-21 DIAGNOSIS — I2721 Secondary pulmonary arterial hypertension: Secondary | ICD-10-CM | POA: Diagnosis not present

## 2017-10-21 DIAGNOSIS — M66241 Spontaneous rupture of extensor tendons, right hand: Secondary | ICD-10-CM | POA: Diagnosis not present

## 2017-10-21 DIAGNOSIS — M25841 Other specified joint disorders, right hand: Secondary | ICD-10-CM | POA: Diagnosis not present

## 2017-10-21 HISTORY — DX: Hypothyroidism, unspecified: E03.9

## 2017-10-21 LAB — BASIC METABOLIC PANEL
Anion gap: 12 (ref 5–15)
BUN: 41 mg/dL — ABNORMAL HIGH (ref 6–20)
CO2: 27 mmol/L (ref 22–32)
Calcium: 9.2 mg/dL (ref 8.9–10.3)
Chloride: 96 mmol/L — ABNORMAL LOW (ref 101–111)
Creatinine, Ser: 1.33 mg/dL — ABNORMAL HIGH (ref 0.44–1.00)
GFR calc Af Amer: 46 mL/min — ABNORMAL LOW (ref >60)
GFR calc non Af Amer: 40 mL/min — ABNORMAL LOW (ref >60)
Glucose, Bld: 105 mg/dL — ABNORMAL HIGH (ref 65–99)
Potassium: 4.1 mmol/L (ref 3.5–5.1)
Sodium: 135 mmol/L (ref 135–145)

## 2017-10-21 LAB — CBC
HCT: 35.2 % — ABNORMAL LOW (ref 36.0–46.0)
Hemoglobin: 11.3 g/dL — ABNORMAL LOW (ref 12.0–15.0)
MCH: 32.2 pg (ref 26.0–34.0)
MCHC: 32.1 g/dL (ref 30.0–36.0)
MCV: 100.3 fL — ABNORMAL HIGH (ref 78.0–100.0)
Platelets: 297 10*3/uL (ref 150–400)
RBC: 3.51 MIL/uL — ABNORMAL LOW (ref 3.87–5.11)
RDW: 14.4 % (ref 11.5–15.5)
WBC: 7.4 10*3/uL (ref 4.0–10.5)

## 2017-10-21 LAB — SURGICAL PCR SCREEN
MRSA, PCR: NEGATIVE
Staphylococcus aureus: NEGATIVE

## 2017-10-21 NOTE — Pre-Procedure Instructions (Signed)
[name] [name]  10/21/2017      [id number] [address], [place] [address] Phone: [phone] Fax: [phone]  [place], [place] [address] Phone: [phone] Fax: [phone]  [place], [place] [address] [place] [address] Phone: [phone] Fax: [phone]  [facility] [address] [place], [place] [address] [place] [address] Phone: [phone] Fax: [phone]  [facility] [address] [place], [place] [address] Phone: [phone] Fax: [phone]    Your procedure is scheduled on Thursday October 25.  [facility] to [facility] at 5:30 A.M.  Call this number if you have problems the morning of surgery:  [phone]   Remember:  Do not eat food or drink liquids after midnight.  Take these medicines the morning of surgery with A SIP OF WATER: gabapentin (neurontin), levothyroxine (synthroid), pantoprazole (protonix), prednisone, sildenafil (Revatio), Macitentan (Opsumit), acetaminophen (tylenol) if needed  7 days prior to surgery STOP taking any Aspirin (unless otherwise instructed by your surgeon), Aleve, Naproxen, Ibuprofen, Motrin, Advil, Goody's, BC's, all herbal medications, fish oil, and all vitamins  FOLLOW MD's instructions on stopping Plavix (clopidogrel)   Do not wear jewelry, make-up or nail polish.  Do not wear lotions, powders, or perfumes, or deoderant.  Do not shave 48 hours prior to surgery.  Men may shave face and neck.  Do not bring valuables to the hospital.  [facility] is not responsible for any belongings or valuables.  Contacts, dentures or bridgework may not be worn into surgery.   Leave your suitcase in the car.  After surgery it may be brought to your room.  For patients admitted to the hospital, discharge time will be determined by your treatment team.  Patients discharged the day of surgery will not be allowed to drive home.    Special instructions:    [facility]- Preparing For Surgery  Before surgery, you can play an important role. Because skin is not sterile, your skin needs to be as free of germs as possible. You can reduce the number of germs on your skin by washing with CHG (chlorahexidine gluconate) Soap before surgery.  CHG is an antiseptic cleaner which kills germs and bonds with the skin to continue killing germs even after washing.  Please do not use if you have an allergy to CHG or antibacterial soaps. If your skin becomes reddened/irritated stop using the CHG.  Do not shave (including legs and underarms) for at least 48 hours prior to first CHG shower. It is OK to shave your face.  Please follow these instructions carefully.   1. Shower the NIGHT BEFORE SURGERY and the MORNING OF SURGERY with CHG.   2. If you chose to wash your hair, wash your hair first as usual with your normal shampoo.  3. After you shampoo, rinse your hair and body thoroughly to remove the shampoo.  4. Use CHG as you would any other liquid soap. You can apply CHG directly to the skin and wash gently with a scrungie or a clean washcloth.   5. Apply the CHG Soap to your body ONLY FROM THE NECK DOWN.  Do not use on open wounds or open sores. Avoid contact with your eyes, ears, mouth and genitals (private  parts). Wash Face and genitals (private parts)  with your normal soap.  6. Wash thoroughly, paying special attention to the area where your surgery will be performed.  7. Thoroughly rinse your body with warm water from the neck down.  8. DO NOT shower/wash with your normal soap after using and rinsing off the CHG Soap.  9. Pat yourself dry with a CLEAN TOWEL.  10. Wear  CLEAN PAJAMAS to bed the night before surgery, wear comfortable clothes the morning of surgery  11. Place CLEAN SHEETS on your bed the night of your first shower and DO NOT SLEEP WITH PETS.    Day of Surgery: Do not apply any deodorants/lotions. Please wear clean clothes to the hospital/surgery center.      Please read over the following fact sheets that you were given. Coughing and Deep Breathing

## 2017-10-21 NOTE — Progress Notes (Signed)
PCP - Tommie Ard Baxley Cardiologist - Dr. [name] Pulmonologist- Dr. Lamonte Sakai, to see on 10/22/17 pt states she will notify him of surgery Pt sees Dr. 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 Amil Amen  Pt states she was told to stop her Plavix and last dose was on Saturday 10/19/17.   Chest x-ray - 10/31/16 EKG - 10/21/2017  ECHO - 05/01/16 Cardiac Cath -[facility] 04/19/17   Pt wears 2L [place] of oxygen at home, 3L with exertion. Pt states she feels at her "normal" today. Typically O2 sat high 90s at home, pt states O2 sat is more accurate when check on ear d/t blood flow problems to her fingers.   Patient denies shortness of breath, fever, cough and chest pain at PAT appointment  Patient verbalized understanding of instructions that were given to them at the PAT appointment. Patient was also instructed that they will need to review over the PAT instructions again at home before surgery.

## 2017-10-22 NOTE — Progress Notes (Addendum)
Anesthesia Chart Review:  Pt is a 66 year old female scheduled for R index Ray resection, R transfer extensor indecus propius/extensor digitorum commumus index to middle ring and small, darrach R ulna on 10/24/2017 with Daryll Brod, MD  Case is posted for axillary block and MAC.   - PCP is [name], MD - Cardiologist is Glori Bickers, MD, last office visit 08/14/17 - Pulmonologist is Baltazar Apo, MD who is aware of upcoming surgery. Last office visit 10/23/17.  - Rheumatologist is Dr. Amil Amen.   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 S/p R femoral endarterectomy, patch angioplasty 09/14/16. BMI 22  Requires O2 2-3L at all times.   Medications include: lipitor, plavix, levothyroxine, opsumit, protonix, iron, prednisone, revatio, spironolactone, demadex. Last dose plavix 10/19/17  BP (!) 118/52   Pulse 66   Temp 36.8 C   Resp 20   Ht 5' 7.5" (1.715 m)   Wt 142 lb 12.8 oz (64.8 kg)   SpO2 96%   BMI 22.04 kg/m   Preoperative labs reviewed.    CXR 10/31/16: Cardiomegaly. Stable chronic interstitial disease. No acute findings.  EKG 10/21/17: NSR. Possible LA enlargement. Rightward axis. T wave abnormality, consider anterior ischemia. No significant change since previous EKG 07/31/16  R cardiac cath 04/19/17:  Findings: RA = 4 RV = 80/8 PA = 79/17 (43) PCW = 6 Fick cardiac output/index = 4.1/2.4 PVR = 9.0 WU Ao sat = 94% PA sat = 59%, 62% Assessment: 1. Moderate PAH  Plan/Discussion: She has persistent moderate PAH with elevated PVR and relatively preserved RV function. WI have suggested adding selexipag. She will discuss with her husband.    Echo 05/01/16:  - Left ventricle: The cavity size was normal. Wall thickness was normal. Systolic function was vigorous. The estimated ejection fraction was in the range of 65% to 70%. Wall motion was normal; there were no regional wall motion abnormalities. Doppler parameters are consistent with abnormal left ventricular relaxation (grade 1 diastolic dysfunction). The E/e&' ratio between 8-15, suggesting indeterminate LV filing pressure. - Mitral valve: Mildly thickened leaflets . There was trivial regurgitation. - Left atrium: The atrium was normal in size. - Right ventricle: The cavity size was moderately dilated. Wall thickness was normal. The moderator band was prominent. Systolic function is reduced. - Right atrium: The atrium was mildly dilated. - Tricuspid valve: There was moderate regurgitation. - Pulmonic valve: The valve appears to be grossly normal. There was mild regurgitation. - Pulmonary arteries: PA peak pressure: 68 mm Hg (S). - Inferior vena cava: The vessel was normal in size. The respirophasic diameter changes were in the normal range (= 50%), consistent with normal central venous pressure. - Impressions: Compared to a prior study in 07/2015, RVSP is reduced from 83 mmHg to 68 mmHg.  If no changes, I anticipate pt can proceed with surgery as scheduled.   Willeen Cass, FNP-BC [facility]/Anesthesiology Phone: [phone] 10/23/2017 2:16 PM

## 2017-10-23 ENCOUNTER — Encounter: Payer: Self-pay | Admitting: Emergency Medicine

## 2017-10-23 ENCOUNTER — Ambulatory Visit (INDEPENDENT_AMBULATORY_CARE_PROVIDER_SITE_OTHER): Payer: BLUE CROSS/BLUE SHIELD | Admitting: Emergency Medicine

## 2017-10-23 DIAGNOSIS — M349 Systemic sclerosis, unspecified: Secondary | ICD-10-CM

## 2017-10-23 DIAGNOSIS — I2729 Other secondary pulmonary hypertension: Secondary | ICD-10-CM | POA: Diagnosis not present

## 2017-10-23 MED ORDER — VANCOMYCIN HCL IN DEXTROSE 1-5 GM/200ML-% IV SOLN
1000.0000 mg | INTRAVENOUS | Status: AC
  Administered 2017-10-24: 1000 mg via INTRAVENOUS
  Filled 2017-10-23: qty 200

## 2017-10-23 NOTE — Assessment & Plan Note (Signed)
Continue current prednisone dosing.  She sees wound care for her right lower extremity.  She is scheduled to have a right second digit partial amputation due to ischemic changes.  This will be done under local block, not general anesthesia.

## 2017-10-23 NOTE — Progress Notes (Signed)
Subjective:    [name], female    DOB: [birth year], 66 y.o.   MRN: [id number] HPI 66 yo woman, never smoker, hx of scleroderma, Raynaud's, non-Hodgkins lymphoma (treated '10), HTN, PAH originally dx in [place] and treated, but meds d/c'd when she improved. Has been seen in by Dr [name], had R heart cath as below in 03/2012. follow up 03/2017.   ROV 07/22/17 -- this follow-up visit for patient with a history of scleroderma. She has secondary pulmonary hypertension that we have managed with sildenafil, macetentan. She added selexipag in May 2018. She experienced headache, has had abdominal discomfort and diarrhea - she had to stop it after about 2-3 weeks. Her last chest x-ray was on 10/31/16 and was stable. Stable on prednisone 81m qd. Was treated with a higher dose methylpred taper for ear fullness. Her exertional tolerance is stable. She uses her o2 at 2-3L/min. She does not have a 6 minute walk or TTE scheduled at this time.   ROV 10/23/17 --patient has a history of scleroderma with secondary pulmonary hypertension.  She is currently being treated with oxygen at 2-3 L/min.  Current regimen includes macitentan, sildenafil, torsemide 40 mg twice daily. She has ischemia to R second digit, will need an amputation under local. She is on pred 515m stable dose. Follow with Dr BeAmil AmenShe has a TTE in December. She has L foot pain, being worked up, wounds are healing. Unable to do 6 minute walk at this time.    04/19/17 --  RA = 4 RV = 80/8 PA = 79/17 (43) PCW = 6 Fick cardiac output/index = 4.1/2.4 PVR = 9.0 WU Ao sat = 94% PA sat = 59%, 62%   Underwent [facility] on 04/18/12 as below:  RA = 9  RV = 75/11/14  PA = 71/25 (44)  PCW = 4  Fick cardiac output/index = 3.0/1.7  Thermo CO/CI = 2.7/1.5  PVR = 23.5 [place]  FA sat = 79% on room air (checked 3 times). Sat with finger probe on air 94%  PA sat = 41%, 43%  MW 04/18/12 880 feet   Recent Labs Lab 10/21/17 1516  HGB 11.3*  HCT  35.2*  WBC 7.4  PLT 297      Review of Systems  Constitutional: Negative for appetite change, fever and unexpected weight change.  HENT: Negative for congestion, dental problem, ear pain, nosebleeds, postnasal drip, rhinorrhea, sinus pressure, sneezing, sore throat and trouble swallowing.   Eyes: Negative for redness and itching.  Respiratory: Positive for shortness of breath. Negative for cough, chest tightness and wheezing.   Cardiovascular: Negative for palpitations and leg swelling.  Gastrointestinal: Negative for nausea and vomiting.  Genitourinary: Negative for dysuria.  Musculoskeletal: Negative for joint swelling.  Skin: Negative for rash.  Neurological: Negative for headaches.  Hematological: Does not bruise/bleed easily.  Psychiatric/Behavioral: Negative for dysphoric mood. The patient is not nervous/anxious.        Objective:   Physical Exam Vitals:   10/23/17 1349 10/23/17 1350  BP:  122/60  Pulse:  70  SpO2:  96%  Weight: 143 lb (64.9 kg)    Gen: Pleasant, thin, in no distress,  normal affect on pulsed nasal cannula O2  ENT: No lesions,  mouth clear,  oropharynx clear, no postnasal drip  Neck: No JVD, no TMG, no carotid bruits  Lungs: No use of accessory muscles, few bibasilar insp crackles  Cardiovascular: RRR, heart sounds normal, no murmur or gallops, trace peripheral edema  Musculoskeletal: No deformities, no cyanosis or clubbing  Neuro: alert, non focal  Skin: significant changes from scleroderma on arms, hands, fingers; 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  Right lower extremity shin and calf are wrapped and dressed      Assessment & Plan:  Pulmonary hypertension associated with systemic disorder (HCC) Clinically stable functional capacity and respiratory status.  Stable oxygen needs.  She did not tolerate selexipag.  We will continue macitentan, sildenafil, current diuretic regimen.  Oxygen is ordered.  She has a repeat  echocardiogram planned for December.  We will continue to consult with cardiology, Dr. [name]  Scleroderma [facility]) Continue current prednisone dosing.  She sees wound care for her right lower extremity.  She is scheduled to have a right second digit partial amputation due to ischemic changes.  This will be done under local block, not general anesthesia.  Baltazar Apo, MD, PhD 10/23/2017, 2:05 PM [facility] [phone] or if no answer [phone]

## 2017-10-23 NOTE — Patient Instructions (Signed)
Please continue prednisone 5 mg once a day Please continue macitentan, sildenafil, torsemide as you are taking them Continue oxygen at 2-3 L/min at all times Get your echocardiogram as planned in December Follow with Dr Lamonte Sakai in 6 months or sooner if you have any problems

## 2017-10-23 NOTE — Assessment & Plan Note (Signed)
Clinically stable functional capacity and respiratory status.  Stable oxygen needs.  She did not tolerate selexipag.  We will continue macitentan, sildenafil, current diuretic regimen.  Oxygen is ordered.  She has a repeat echocardiogram planned for December.  We will continue to consult with cardiology, Dr. [name]

## 2017-10-24 ENCOUNTER — Encounter (HOSPITAL_COMMUNITY)
Admission: RE | Disposition: A | Discharge status: Home / Self Care | Payer: Self-pay | Source: Ambulatory Visit | Attending: Orthopedic Surgery

## 2017-10-24 ENCOUNTER — Encounter (HOSPITAL_COMMUNITY): Payer: Self-pay | Admitting: Urology

## 2017-10-24 ENCOUNTER — Ambulatory Visit (HOSPITAL_COMMUNITY): Payer: BLUE CROSS/BLUE SHIELD | Admitting: Emergency Medicine

## 2017-10-24 ENCOUNTER — Ambulatory Visit (HOSPITAL_COMMUNITY)
Admission: RE | Admit: 2017-10-24 | Discharge: 2017-10-24 | Disposition: A | Discharge status: Home / Self Care | Payer: BLUE CROSS/BLUE SHIELD | Source: Ambulatory Visit | Attending: Orthopedic Surgery | Admitting: Orthopedic Surgery

## 2017-10-24 ENCOUNTER — Ambulatory Visit (HOSPITAL_COMMUNITY): Payer: BLUE CROSS/BLUE SHIELD | Admitting: Certified Registered Nurse Anesthetist

## 2017-10-24 DIAGNOSIS — Z882 Allergy status to sulfonamides status: Secondary | ICD-10-CM | POA: Insufficient documentation

## 2017-10-24 DIAGNOSIS — I2721 Secondary pulmonary arterial hypertension: Secondary | ICD-10-CM | POA: Insufficient documentation

## 2017-10-24 DIAGNOSIS — Z9981 Dependence on supplemental oxygen: Secondary | ICD-10-CM | POA: Insufficient documentation

## 2017-10-24 DIAGNOSIS — I96 Gangrene, not elsewhere classified: Secondary | ICD-10-CM | POA: Diagnosis not present

## 2017-10-24 DIAGNOSIS — Z881 Allergy status to other antibiotic agents status: Secondary | ICD-10-CM | POA: Insufficient documentation

## 2017-10-24 DIAGNOSIS — L940 Localized scleroderma [morphea]: Secondary | ICD-10-CM | POA: Insufficient documentation

## 2017-10-24 DIAGNOSIS — I10 Essential (primary) hypertension: Secondary | ICD-10-CM | POA: Insufficient documentation

## 2017-10-24 DIAGNOSIS — M66241 Spontaneous rupture of extensor tendons, right hand: Secondary | ICD-10-CM | POA: Insufficient documentation

## 2017-10-24 DIAGNOSIS — E039 Hypothyroidism, unspecified: Secondary | ICD-10-CM | POA: Insufficient documentation

## 2017-10-24 DIAGNOSIS — Z880 Allergy status to penicillin: Secondary | ICD-10-CM | POA: Insufficient documentation

## 2017-10-24 DIAGNOSIS — M25841 Other specified joint disorders, right hand: Secondary | ICD-10-CM | POA: Insufficient documentation

## 2017-10-24 HISTORY — PX: AMPUTATION: SHX166

## 2017-10-24 HISTORY — PX: ULNAR HEAD EXCISION: SHX6344

## 2017-10-24 HISTORY — PX: TENDON TRANSFER: SHX6109

## 2017-10-24 SURGERY — AMPUTATION DIGIT
Anesthesia: Monitor Anesthesia Care | Site: Finger | Laterality: Right

## 2017-10-24 MED ORDER — METHYLPREDNISOLONE 4 MG PO TBPK: ORAL_TABLET | ORAL | 0 refills | Status: DC

## 2017-10-24 MED ORDER — OXYCODONE-ACETAMINOPHEN 5-325 MG PO TABS: 1.0000 | ORAL_TABLET | ORAL | 0 refills | Status: DC | PRN

## 2017-10-24 MED ORDER — PROPOFOL 10 MG/ML IV BOLUS
INTRAVENOUS | Status: AC
  Filled 2017-10-24: qty 20

## 2017-10-24 MED ORDER — DOXYCYCLINE HYCLATE 50 MG PO CAPS: 50.0000 mg | ORAL_CAPSULE | Freq: Two times a day (BID) | ORAL | 0 refills | Status: DC

## 2017-10-24 MED ORDER — MIDAZOLAM HCL 2 MG/2ML IJ SOLN
INTRAMUSCULAR | Status: AC
  Filled 2017-10-24: qty 2

## 2017-10-24 MED ORDER — MIDAZOLAM HCL 5 MG/5ML IJ SOLN
INTRAMUSCULAR | Status: DC | PRN
  Administered 2017-10-24: 2 mg via INTRAVENOUS

## 2017-10-24 MED ORDER — FENTANYL CITRATE (PF) 250 MCG/5ML IJ SOLN
INTRAMUSCULAR | Status: AC
  Filled 2017-10-24: qty 5

## 2017-10-24 MED ORDER — PROPOFOL 500 MG/50ML IV EMUL
INTRAVENOUS | Status: DC | PRN
  Administered 2017-10-24: 50 ug/kg/min via INTRAVENOUS

## 2017-10-24 MED ORDER — LIDOCAINE HCL (PF) 1 % IJ SOLN: INTRAMUSCULAR | Status: DC | PRN

## 2017-10-24 MED ORDER — FENTANYL CITRATE (PF) 100 MCG/2ML IJ SOLN
INTRAMUSCULAR | Status: DC | PRN
  Administered 2017-10-24: 50 ug via INTRAVENOUS

## 2017-10-24 MED ORDER — LACTATED RINGERS IV SOLN
INTRAVENOUS | Status: DC | PRN
  Administered 2017-10-24 (×2): via INTRAVENOUS

## 2017-10-24 MED ORDER — HYDROMORPHONE HCL 1 MG/ML IJ SOLN: 0.2500 mg | INTRAMUSCULAR | Status: DC | PRN

## 2017-10-24 MED ORDER — 0.9 % SODIUM CHLORIDE (POUR BTL) OPTIME
TOPICAL | Status: DC | PRN
  Administered 2017-10-24: 1000 mL

## 2017-10-24 MED ORDER — MEPERIDINE HCL 25 MG/ML IJ SOLN: 6.2500 mg | INTRAMUSCULAR | Status: DC | PRN

## 2017-10-24 MED ORDER — LIDOCAINE HCL (PF) 1 % IJ SOLN
INTRAMUSCULAR | Status: AC
  Filled 2017-10-24: qty 30

## 2017-10-24 MED ORDER — PROPOFOL 10 MG/ML IV BOLUS
INTRAVENOUS | Status: DC | PRN
  Administered 2017-10-24: 10 mg via INTRAVENOUS
  Administered 2017-10-24: 20 mg via INTRAVENOUS
  Administered 2017-10-24: 10 mg via INTRAVENOUS
  Administered 2017-10-24: 20 mg via INTRAVENOUS

## 2017-10-24 MED ORDER — CHLORHEXIDINE GLUCONATE 4 % EX LIQD: 60.0000 mL | Freq: Once | CUTANEOUS | Status: DC

## 2017-10-24 MED ORDER — ONDANSETRON HCL 4 MG/2ML IJ SOLN
INTRAMUSCULAR | Status: AC
  Filled 2017-10-24: qty 2

## 2017-10-24 SURGICAL SUPPLY — 48 items
BLADE LONG MED 31X9 (MISCELLANEOUS) ×2 IMPLANT
BLADE MINI ORANGE (BLADE) ×2 IMPLANT
BLADE MINI RND TIP GREEN BEAV (BLADE) ×2 IMPLANT
BNDG COHESIVE 3X5 TAN STRL LF (GAUZE/BANDAGES/DRESSINGS) ×2 IMPLANT
BNDG ESMARK 4X9 LF (GAUZE/BANDAGES/DRESSINGS) ×2 IMPLANT
BNDG GAUZE ELAST 4 BULKY (GAUZE/BANDAGES/DRESSINGS) ×2 IMPLANT
CORDS BIPOLAR (ELECTRODE) ×2 IMPLANT
CUFF TOURNIQUET SINGLE 18IN (TOURNIQUET CUFF) ×2 IMPLANT
CUFF TOURNIQUET SINGLE 24IN (TOURNIQUET CUFF) IMPLANT
DRSG KUZMA FLUFF (GAUZE/BANDAGES/DRESSINGS) IMPLANT
DURAPREP 26ML APPLICATOR (WOUND CARE) ×2 IMPLANT
GAUZE SPONGE 4X4 12PLY STRL (GAUZE/BANDAGES/DRESSINGS) ×2 IMPLANT
GAUZE XEROFORM 1X8 LF (GAUZE/BANDAGES/DRESSINGS) ×2 IMPLANT
GLOVE BIO SURGEON STRL SZ 6.5 (GLOVE) ×2 IMPLANT
GLOVE BIOGEL PI IND STRL 6 (GLOVE) ×1 IMPLANT
GLOVE BIOGEL PI IND STRL 7.0 (GLOVE) ×1 IMPLANT
GLOVE BIOGEL PI IND STRL 8.5 (GLOVE) ×1 IMPLANT
GLOVE BIOGEL PI INDICATOR 6 (GLOVE) ×1
GLOVE BIOGEL PI INDICATOR 7.0 (GLOVE) ×1
GLOVE BIOGEL PI INDICATOR 8.5 (GLOVE) ×1
GLOVE INDICATOR 6.5 STRL GRN (GLOVE) ×2 IMPLANT
GLOVE SURG ORTHO 8.0 STRL STRW (GLOVE) ×2 IMPLANT
GOWN STRL REUS W/ TWL LRG LVL3 (GOWN DISPOSABLE) ×2 IMPLANT
GOWN STRL REUS W/ TWL XL LVL3 (GOWN DISPOSABLE) ×1 IMPLANT
GOWN STRL REUS W/TWL LRG LVL3 (GOWN DISPOSABLE) ×2
GOWN STRL REUS W/TWL XL LVL3 (GOWN DISPOSABLE) ×1
KIT BASIN OR (CUSTOM PROCEDURE TRAY) ×2 IMPLANT
KIT ROOM TURNOVER OR (KITS) ×2 IMPLANT
MANIFOLD NEPTUNE II (INSTRUMENTS) ×2 IMPLANT
NEEDLE HYPO 25GX1X1/2 BEV (NEEDLE) IMPLANT
NS IRRIG 1000ML POUR BTL (IV SOLUTION) ×2 IMPLANT
PACK ORTHO EXTREMITY (CUSTOM PROCEDURE TRAY) ×2 IMPLANT
PAD ARMBOARD 7.5X6 YLW CONV (MISCELLANEOUS) ×4 IMPLANT
PAD CAST 4YDX4 CTTN HI CHSV (CAST SUPPLIES) ×1 IMPLANT
PADDING CAST COTTON 4X4 STRL (CAST SUPPLIES) ×1
RUBBERBAND STERILE (MISCELLANEOUS) ×2 IMPLANT
SPECIMEN JAR SMALL (MISCELLANEOUS) ×2 IMPLANT
SUT ETHILON 4 0 PS 2 18 (SUTURE) ×4 IMPLANT
SUT ETHILON 5 0 PS 2 18 (SUTURE) IMPLANT
SUT SILK 4 0 PS 2 (SUTURE) IMPLANT
SUT VIC AB 4-0 PS2 18 (SUTURE) ×2 IMPLANT
SUT VICRYL 4-0 PS2 18IN ABS (SUTURE) IMPLANT
SYR CONTROL 10ML LL (SYRINGE) IMPLANT
TOWEL OR 17X24 6PK STRL BLUE (TOWEL DISPOSABLE) ×2 IMPLANT
TOWEL OR 17X26 10 PK STRL BLUE (TOWEL DISPOSABLE) ×2 IMPLANT
TUBE CONNECTING 12X1/4 (SUCTIONS) ×2 IMPLANT
UNDERPAD 30X30 (UNDERPADS AND DIAPERS) ×2 IMPLANT
WATER STERILE IRR 1000ML POUR (IV SOLUTION) ×2 IMPLANT

## 2017-10-24 NOTE — Brief Op Note (Signed)
10/24/2017  9:15 AM  PATIENT:  [name] [name]  66 y.o. 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  PROCEDURE:  Procedure(s) with comments: RAY resection right index (Right) - Axillary block transfer extensor indecus propius/extensor digitorum commomus index to middle ring and small (Right) darrach right ulna (Right)  SURGEON:  Surgeon(s) and Role:    Daryll Brod, MD - Primary  PHYSICIAN ASSISTANT:   ASSISTANTS:  Leverne Humbles, PAC   ANESTHESIA:   regional and IV sedation  EBL:   none   BLOOD ADMINISTERED:none  DRAINS: none   LOCAL MEDICATIONS USED:  NONE  SPECIMEN:  excision  DISPOSITION OF SPECIMEN:  PATHOLOGY  COUNTS:  YES  TOURNIQUET:  * Missing tourniquet times found for documented tourniquets in log:  [id number] *  DICTATION: .Other Dictation: Dictation Number [id number]  PLAN OF CARE: Discharge to home after PACU  PATIENT DISPOSITION:  PACU - hemodynamically stable.

## 2017-10-24 NOTE — Progress Notes (Signed)
Orthopedic Tech Progress Note Patient Details:  [name] [name] August 31, 1951 [id number]  Ortho Devices Type of Ortho Device: Arm sling Ortho Device/Splint Interventions: Application   [name] 10/24/2017, 11:15 AM

## 2017-10-24 NOTE — Op Note (Signed)
Dictation Number [id number]

## 2017-10-24 NOTE — Anesthesia Postprocedure Evaluation (Signed)
Anesthesia Post Note  Patient: [name] [name]  Procedure(s) Performed: RAY resection right index (Right Finger) transfer extensor indecus propius/extensor digitorum commomus index to middle ring and small (Right Finger) darrach right ulna (Right Finger)     Patient location during evaluation: PACU Anesthesia Type: Regional Level of consciousness: awake and alert and patient cooperative Pain management: pain level controlled Vital Signs Assessment: post-procedure vital signs reviewed and stable Respiratory status: spontaneous breathing and respiratory function stable Cardiovascular status: stable Anesthetic complications: no    Last Vitals:  Vitals:   10/24/17 1033 10/24/17 1042  BP: (!) 107/49   Pulse: (!) 49 (!) 49  Resp: 15 16  Temp:    SpO2: 95% 94%    Last Pain:  Vitals:   10/24/17 0932  TempSrc:   PainSc: 0-No pain                 Arien Benincasa DAVID

## 2017-10-24 NOTE — Op Note (Signed)
NAMECHOLE, DRIVER                ACCOUNT NO.:  [id number]  MEDICAL RECORD NO.:  [id number]  LOCATION:                                 FACILITY:  PHYSICIAN:  Daryll Brod, M.D.            DATE OF BIRTH:  DATE OF PROCEDURE:  10/24/2017 DATE OF DISCHARGE:                              OPERATIVE REPORT   PREOPERATIVE DIAGNOSES:Vaughn -Glennon Mac syndrome, Scleroderma, Gangrene index finger   POST OPERATIVE DIAGNOSIS: Same  OPERATION: Ray resection, right index finger; transfer of extensor digitorum communis and extensor indicis proprius to extensor digitorum communis, middle, ring, and small fingers; Darrach and stabilization of the  Right distal radio-ulnar joint.  SURGEON:  Daryll Brod, MD.  ASSISTANT:  Marily Lente. Dasnoit, PA-C.  PLACE OF SURGERY:  [facility].  HISTORY:  The patient is a 66 year old female with a history of scleroderma and arthritis.  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  Her scleroderma has resulted in necrosis of her index finger to the PIP joint level.  She is admitted for ray resection with transfer of the extensor indicis proprius, extensor digitorum communis to the middle, ring, and small fingers.  Excision of her distal ulna.  She is aware that there is no guarantee to the surgery; the possibility of infection; recurrence of injury to arteries, nerves, tendons; incomplete relief of symptoms; dystrophy.  In the preoperative area, the patient was seen, the extremity marked by both patient and surgeon.  Antibiotic given.  PROCEDURE IN DETAIL:  The patient was brought to the operating room. After a supraclavicular block was carried out in the preoperative area under the direction of Dr. Conrad Addison.  She was prepped using ChloraPrep in a supine position with the right arm free.  A 3-minute dry time was allowed and a time-out  taken, confirming patient and procedure.  The limb was exsanguinated from the hand proximally.  A tourniquet placed high on the upper arm, was inflated to 250 mmHg.  An incision was then made over the distal ulna carried down through subcutaneous tissue. Bleeders were electrocauterized with bipolar.  An incision was then made over the dorsum of the distal ulna.  The distal ulna was then isolated, Hohmann retractors were placed.  An oscillating saw was then used to cut the ulna.  Approximately a centimeter proximal to the head of the ulna, which was distinctly arthritic and moderately deformed.  Bone was extremely soft.  It was then removed with rongeurs and sharp dissection. An extensor carpi ulnaris, which had subluxated was then identified. The dorsal portion was then used to stabilize the distal ulna.  This was dissected out to the level of the pisiform.  This was placed into the canal of the ulna.  Drill holes were placed using FiberWire and needle and that the bone was soft enough to pass through and a suture was then used to stabilize the distal radioulnar joint with 2-0 FiberWire pulling the one-half of the extensor carpi ulnaris into the canal of the ulna. This was then sutured over  the bone to stabilize the stump of the ulna. Wound was copiously irrigated with saline.  The sheath of the extensor carpi ulnaris was then sutured over the dorsum of the hand to stabilize the distal ulna as much as possible.  This was done with figure-of-eight 4-0 Vicryl sutures.  The subcutaneous tissue was closed with interrupted 4-0 Vicryl and the skin with interrupted 4-0 nylon sutures.  An incision was then made on the radial aspect dorsally of the index metacarpal carried down through subcutaneous tissue.  This was then dissected around the base of the index proximal phalanx.  The neurovascular structures were identified.  These were transected, tagged.  The interossei muscle was then dissected  free from the shaft of the second metacarpal.  These were then transected distally allowing the shaft of the metacarpal to be transected proximally with an oscillating saw. This allowed removal of the entire index ray.  The stump of the index metacarpal was then smoothed with a rongeur.  The nerves were then passed into muscle to minimize the neuroma formation.  The arteries were sutured with 4-0 Vicryl sutures.  The interossei were then sutured to the inner metacarpal ligament.  The wound was irrigated.  The subcutaneous tissue was closed with interrupted 4-0 Vicryl.  The skin was then closed after excising redundant skin with interrupted 4-0 nylon sutures.  The extensor tendons were left distally.  These were passed under the skin.  A separate incision was made over the metacarpal of the ring finger carried down through subcutaneous tissue.  The extensor digitorum communis to the index, extensor indicis proprius were then brought into this wound.  The rupture of the tendons was noted proximally.  The canal of the fourth dorsal compartment was opened and a large amount of necrotic tissue was removed.  The extensor digitorum communis was fibrillated, but not entirely ruptured.  It was decided to pass both tendons through the middle, ring, and small finger in a Pulvertaft weave leaving the extensor digitorum communis fibers to the middle finger extensor digitorum communis juncture intact.  These Pulvertaft weave was then made multiple times and sutured into position with horizontal mattress 4-0 FiberWire sutures.  This was done with the wrist extended, the fingers extended at the metacarpophalangeal joint. The PIP joints were stiff and flexion and no attempt was made to change this.  The patient was aware that we were transferring for extension of the metacarpophalangeal joints only.  The extensor retinaculum was then transected.  A marked amount of scar tissue was present around  the ruptured tendons.  This was entirely freed and a tenosynovectomy performed dorsally.  The hand was placed through full flexion and extension.  The fingers were fully straight in extension with the wrist in neutral position.  These were fully flexed with the wrist dorsiflexed.  The wound was copiously irrigated with saline.  The proximal ends of the ruptured tendons were then smoothed.  The subcutaneous tissue was closed after irrigation with interrupted 4-0 Vicryl and the skin with interrupted 4-0 nylon sutures.  A sterile compressive dressing and splint were applied.  On deflation of the tourniquet, remaining fingers pinked.  She was taken to the recovery room for observation in satisfactory condition.  She will be discharged to home to return to the [facility] in 1 week, on [place] and Septra DS.          ______________________________ Daryll Brod, M.D.     GK/MEDQ  D:  10/24/2017  T:  10/24/2017  Job:  [id number]

## 2017-10-24 NOTE — Discharge Instructions (Signed)
[facility] Instructions Hand Surgery  Wound Care: Keep your hand elevated above the level of your heart.  Do not allow it to dangle by your side.  Keep the dressing dry and do not remove it unless your doctor advises you to do so.  He will usually change it at the time of your post-op visit.  Moving your fingers is advised to stimulate circulation but will depend on the site of your surgery.  If you have a splint applied, your doctor will advise you regarding movement.  Activity: Do not drive or operate machinery today.  Rest today and then you may return to your normal activity and work as indicated by your physician.  Diet:  Drink liquids today or eat a light diet.  You may resume a regular diet tomorrow.    General expectations: Pain for two to three days. Fingers may become slightly swollen.  Call your doctor if any of the following occur: Severe pain not relieved by pain medication. Elevated temperature. Dressing soaked with blood. Inability to move fingers. White or bluish color to fingers.

## 2017-10-24 NOTE — H&P (Signed)
[name] [name] is an 66 y.o. female.   Chief Complaint: gangrene rightindex finger loss of extension middle ring and small FVC:BSWH is a 66 yo female with [name] [name] syndrome right arm.  She states that approximately 3-4 months ago she began having necrotic changes on her index finger right hand. This has progressed back to the level of PIP joint area. She has some evidence of purulence along the margin. She is complaining of moderate pain with this. She has  no new injury. She continues to have the inability to extend her middle ring and small fingers with degenerative changes of distal radio-ulnar joint. She states that the area of necrosis on her leg has not entirely healed. She states there is no evidence of infection on her leg. She has a history of scleroderma.      Past Medical History:  Diagnosis Date  . Anemia   . Arthritis   . DOE (dyspnea on exertion)   . Epistaxis   . History of chicken pox   . Hypertension   . Hypothyroidism   . Leg ulcer ([place])   . Melanoma ([place]) 1999  . 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[place])   . Non Hodgkin's lymphoma ([place])    Treated with chemo 2010, felt to be cured.  Marland Kitchen PAH (pulmonary artery hypertension) ([place])   . Peripheral arterial occlusive disease ([place])   . Pulmonary hypertension ([place])   . Requires supplemental oxygen    2 liters-3liters when moving  . 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 apligraph  12-04-2102,   12-18-2012   bilateral calves   [name]  . biopsy on cerebellum    . COLONOSCOPY WITH PROPOFOL Left 10/07/2015   Procedure: COLONOSCOPY WITH PROPOFOL;  Surgeon: [name], MD;  Location: [place];  Service: Endoscopy;  Laterality: Left;  . ENDARTERECTOMY FEMORAL Right 09/14/2016   Procedure: ENDARTERECTOMY FEMORAL RIGHT;  Surgeon: [name], MD;  Location: [facility] OR;  Service: Vascular;  Laterality: Right;  . ENDOVENOUS ABLATION SAPHENOUS VEIN W/ LASER   07-24-2012   right greater saphenous vein by [name], M.D.   . ENDOVENOUS ABLATION SAPHENOUS VEIN W/ LASER  10-16-2012   left greater saphenous vein  by Dr. [name]  . ENDOVENOUS ABLATION SAPHENOUS VEIN W/ LASER Right 09-01-2015   endovenous laser ablation right greater saphenous vein by [name] MD  . ESOPHAGOGASTRODUODENOSCOPY N/A 05/07/2016   Procedure: ESOPHAGOGASTRODUODENOSCOPY (EGD);  Surgeon: [name], MD;  Location: [facility] ENDOSCOPY;  Service: Endoscopy;  Laterality: N/A;  . ESOPHAGOGASTRODUODENOSCOPY (EGD) WITH PROPOFOL Left 10/07/2015   Procedure: ESOPHAGOGASTRODUODENOSCOPY (EGD) WITH PROPOFOL;  Surgeon: [name], MD;  Location: [place];  Service: Endoscopy;  Laterality: Left;  . GIVENS CAPSULE STUDY N/A 07/31/2016   Procedure: GIVENS CAPSULE STUDY;  Surgeon: [name], MD;  Location: [facility] ENDOSCOPY;  Service: Endoscopy;  Laterality: N/A;  . melonoma removes Right    behind right knee  . PATCH ANGIOPLASTY Right 09/14/2016   Procedure: PATCH ANGIOPLASTY RIGHT FEMORAL ARTERY USING HEMASHIELD PLATINUM FINESSE PATCH;  Surgeon: [name], MD;  Location: [place];  Service: Vascular;  Laterality: Right;  . PERIPHERAL VASCULAR CATHETERIZATION N/A 09/07/2016   Procedure: Abdominal Aortogram w/Lower Extremity;  Surgeon: [name], MD;  Location: [place] CV LAB;  Service: Cardiovascular;  Laterality: N/A;  . RIGHT HEART CATH N/A 04/19/2017   Procedure: Right Heart Cath;  Surgeon: [name], MD;  Location: [place]  CV LAB;  Service: Cardiovascular;  Laterality: N/A;  . RIGHT HEART CATHETERIZATION N/A 11/14/2012   Procedure: RIGHT HEART CATH;  Surgeon: [name], MD;  Location: [facility] CATH LAB;  Service: Cardiovascular;  Laterality: N/A;  . rt little toe removal  10/1998  . stent left thigh  3/11  . TONSILLECTOMY    . TONSILLECTOMY AND ADENOIDECTOMY    . TUNNELED VENOUS CATHETER PLACEMENT  01/2013  . VEIN LIGATION AND STRIPPING  05/16/2012   Procedure: VEIN  LIGATION AND STRIPPING;  Surgeon: [name], MD;  Location[facility];  Service: Vascular;  Laterality: Right;  Irrigation and Debridement right lower leg, ligation of saphenous vein.    Family History  Problem Relation Age of Onset  . Lupus Father   . Lymphoma Father   . Hyperlipidemia Mother   . Cancer Daughter        sarcoma  . Colon cancer Unknown    Social History:  reports that she has never smoked. She has never used smokeless tobacco. She reports that she drinks alcohol. She reports that she does not use drugs.  Allergies:  Allergies  Allergen Reactions  . Levofloxacin Other (See Comments)    FLU LIKE SYMPTOM  . Sulfa Antibiotics Rash and Other (See Comments)    Face peeled Severe rash with significant peeling of face. No airway involvement per patient  . Levaquin [Levofloxacin In D5w] Other (See Comments)    FLU LIKE SYMPTOMS  . Doxycycline Hyclate Swelling    SWELLING REACTION UNSPECIFIED  "TABLETS ONLY - CAPSULES ARE TOLERATED FINE"  . Penicillins Rash    Has patient had a PCN reaction causing immediate rash, facial/tongue/throat swelling, SOB or lightheadedness with hypotension: no Has patient had a PCN reaction causing severe rash involving mucus membranes or skin necrosis: no Has patient had a PCN reaction that required hospitalization: no Has patient had a PCN reaction occurring within the last 10 years: {no If all of the above answers are "NO", then may proceed with Cephalosporin use.    No prescriptions prior to admission.    No results found for this or any previous visit (from the past 48 hour(s)).  No results found.   Pertinent items are noted in HPI.  There were no vitals taken for this visit.  General appearance: alert, cooperative and appears stated age Head: Normocephalic, without obvious abnormality Neck: no JVD Resp: clear to auscultation bilaterally Cardio: regular rate and rhythm, S1, S2 normal, no murmur, click, rub or gallop GI: soft,  non-tender; bowel sounds normal; no masses,  no organomegaly Extremities:  gangrene right index finger loss of extension middle ring and small Pulses: 2+ and symmetric Skin: Skin color, texture, turgor normal. No rashes or lesions Neurologic: Grossly normal Incision/Wound: Gangrene index finger  Assessment/Plan Assessment:  1. 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 Dry gangrene Right  3. Right hand pain  4. Scleroderma  5. Primary osteoarthritis of right hand    Plan: We would recommend ray resection this will free up her extensor tendons which can be transferred at the time of the ray resection. We would also recommend a distal ulna section be performed at that point time. Would like to place her on doxycycline at the present time. 200 mg 1 p.o. twice daily. She is scheduled as an outpatient with regional anesthesia. She is advised there is no guarantee to the surgery the possibility of infection recurrence injury to arteries nerves tendons the fact that she  will not regain full extension of her fingers at the PIP DIP joint levels. Questions are encouraged and answered to her satisfaction.      Gianlucas Evenson R 10/24/2017, 3:32 AM

## 2017-10-24 NOTE — Transfer of Care (Signed)
Immediate Anesthesia Transfer of Care Note  Patient: [name] [name]  Procedure(s) Performed: RAY resection right index (Right Finger) transfer extensor indecus propius/extensor digitorum commomus index to middle ring and small (Right Finger) darrach right ulna (Right Finger)  Patient Location: PACU  Anesthesia Type:MAC combined with regional for post-op pain  Level of Consciousness: awake, alert  and oriented  Airway & Oxygen Therapy: Patient Spontanous Breathing and Patient connected to face mask oxygen  Post-op Assessment: Report given to RN and Post -op Vital signs reviewed and stable  Post vital signs: Reviewed and stable  Last Vitals:  Vitals:   10/24/17 0558  BP: (!) 144/39  Pulse: (!) 55  Resp: 20  Temp: 36.5 C  SpO2: 97%    Last Pain:  Vitals:   10/24/17 0558  TempSrc: Oral  PainSc:          Complications: No apparent anesthesia complications

## 2017-10-24 NOTE — Anesthesia Preprocedure Evaluation (Addendum)
Anesthesia Evaluation  Patient identified by MRN, date of birth, ID band Patient awake    Reviewed: Allergy & Precautions, NPO status , Patient's Chart, lab work & pertinent test results  Airway Mallampati: I  TM Distance: >3 FB Neck ROM: Full    Dental   Pulmonary    Pulmonary exam normal        Cardiovascular hypertension, Pt. on medications + DOE  Normal cardiovascular exam     Neuro/Psych Depression    GI/Hepatic   Endo/Other    Renal/GU Renal InsufficiencyRenal disease     Musculoskeletal   Abdominal   Peds  Hematology   Anesthesia Other Findings   Reproductive/Obstetrics                             Anesthesia Physical Anesthesia Plan  ASA: III  Anesthesia Plan: Regional and MAC   Post-op Pain Management:    Induction:   PONV Risk Score and Plan: 2 and Treatment may vary due to age or medical condition  Airway Management Planned: Simple Face Mask  Additional Equipment:   Intra-op Plan:   Post-operative Plan:   Informed Consent: I have reviewed the patients History and Physical, chart, labs and discussed the procedure including the risks, benefits and alternatives for the proposed anesthesia with the patient or authorized representative who has indicated his/her understanding and acceptance.     Plan Discussed with: CRNA and Surgeon  Anesthesia Plan Comments:         Anesthesia Quick Evaluation

## 2017-10-25 ENCOUNTER — Encounter (HOSPITAL_COMMUNITY): Payer: Self-pay | Admitting: Orthopedic Surgery

## 2017-10-25 MED ORDER — BUPIVACAINE-EPINEPHRINE (PF) 0.5% -1:200000 IJ SOLN
INTRAMUSCULAR | Status: DC | PRN
  Administered 2017-10-24: 30 mL via PERINEURAL

## 2017-10-25 NOTE — Addendum Note (Signed)
Addendum  created 10/25/17 1706 by Lillia Abed, MD   Anesthesia Intra Blocks edited, Anesthesia Intra Meds edited, Child order released for a procedure order, Sign clinical note

## 2017-10-25 NOTE — Anesthesia Procedure Notes (Signed)
Anesthesia Regional Block: Supraclavicular block   Pre-Anesthetic Checklist: ,, timeout performed, Correct Patient, Correct Site, Correct Laterality, Correct Procedure, Correct Position, site marked, Risks and benefits discussed,  Surgical consent,  Pre-op evaluation,  At surgeon's request and post-op pain management  Laterality: Right  Prep: chloraprep       Needles:   Needle Type: Echogenic Stimulator Needle     Needle Length: 9cm  Needle Gauge: 21     Additional Needles:   Procedures:, nerve stimulator,,,,,,,   Nerve Stimulator or Paresthesia:  Response: 0.4 mA,   Additional Responses:   Narrative:  Start time: 10/24/2017 7:15 AM End time: 10/24/2017 7:25 AM Injection made incrementally with aspirations every 5 mL.  Performed by: Personally  Anesthesiologist: Lillia Abed  Additional Notes: Monitors applied. Patient sedated. Sterile prep and drape,hand hygiene and sterile gloves were used. Relevant anatomy identified.Needle position confirmed.Local anesthetic injected incrementally after negative aspiration. Local anesthetic spread visualized around nerve(s). Vascular puncture avoided. No complications. Image printed for medical record.The patient tolerated the procedure well.

## 2017-10-27 NOTE — Patient Instructions (Signed)
It was a pleasure to see you today.  Good luck with upcoming surgery.  Continue to be seen at wound care center.  Please return in 6 months or as needed.  Continue same medications.

## 2017-10-28 DIAGNOSIS — I87331 Chronic venous hypertension (idiopathic) with ulcer and inflammation of right lower extremity: Secondary | ICD-10-CM | POA: Diagnosis not present

## 2017-10-31 ENCOUNTER — Telehealth: Payer: Self-pay

## 2017-10-31 ENCOUNTER — Telehealth: Payer: Self-pay | Admitting: Internal Medicine

## 2017-10-31 MED ORDER — POLYSACCHARIDE IRON COMPLEX 150 MG PO CAPS: 150.0000 mg | ORAL_CAPSULE | Freq: Two times a day (BID) | ORAL | 99 refills | Status: DC

## 2017-10-31 NOTE — Telephone Encounter (Signed)
Done this morning

## 2017-10-31 NOTE — Telephone Encounter (Signed)
Received fax from [facility] in regards to a refill on Poly-Iron 150 mg for patient. Medication was refilled per Dr. Verlene Mayer request. Sent PRN refills

## 2017-10-31 NOTE — Telephone Encounter (Signed)
Patient calling for refill on Poly-Iron 167m capsule.  She takes 1 capsule po bid.    Pharmacy:  [facility] for contact:  [phone] Thank you.

## 2017-10-31 NOTE — Telephone Encounter (Signed)
Has been done!

## 2017-11-01 ENCOUNTER — Other Ambulatory Visit: Payer: Self-pay

## 2017-11-11 ENCOUNTER — Encounter (HOSPITAL_BASED_OUTPATIENT_CLINIC_OR_DEPARTMENT_OTHER): Payer: BLUE CROSS/BLUE SHIELD | Attending: Internal Medicine

## 2017-11-11 DIAGNOSIS — L97212 Non-pressure chronic ulcer of right calf with fat layer exposed: Secondary | ICD-10-CM | POA: Insufficient documentation

## 2017-11-11 DIAGNOSIS — L97312 Non-pressure chronic ulcer of right ankle with fat layer exposed: Secondary | ICD-10-CM | POA: Diagnosis not present

## 2017-11-11 DIAGNOSIS — I10 Essential (primary) hypertension: Secondary | ICD-10-CM | POA: Insufficient documentation

## 2017-11-11 DIAGNOSIS — L97812 Non-pressure chronic ulcer of other part of right lower leg with fat layer exposed: Secondary | ICD-10-CM | POA: Diagnosis not present

## 2017-11-11 DIAGNOSIS — I7300 Raynaud's syndrome without gangrene: Secondary | ICD-10-CM | POA: Diagnosis not present

## 2017-11-11 DIAGNOSIS — I87331 Chronic venous hypertension (idiopathic) with ulcer and inflammation of right lower extremity: Secondary | ICD-10-CM | POA: Diagnosis not present

## 2017-11-19 ENCOUNTER — Other Ambulatory Visit (HOSPITAL_COMMUNITY): Payer: Self-pay | Admitting: Internal Medicine

## 2017-11-19 ENCOUNTER — Other Ambulatory Visit (HOSPITAL_COMMUNITY): Payer: Self-pay | Admitting: Obstetrics and Gynecology

## 2017-11-19 DIAGNOSIS — Z8572 Personal history of non-Hodgkin lymphomas: Secondary | ICD-10-CM

## 2017-11-26 ENCOUNTER — Other Ambulatory Visit: Payer: BLUE CROSS/BLUE SHIELD | Admitting: Internal Medicine

## 2017-11-26 ENCOUNTER — Ambulatory Visit (HOSPITAL_COMMUNITY): Payer: BLUE CROSS/BLUE SHIELD

## 2017-11-26 DIAGNOSIS — E039 Hypothyroidism, unspecified: Secondary | ICD-10-CM | POA: Diagnosis not present

## 2017-11-26 DIAGNOSIS — R7989 Other specified abnormal findings of blood chemistry: Secondary | ICD-10-CM

## 2017-11-26 LAB — TSH: TSH: 1.66 mIU/L (ref 0.40–4.50)

## 2017-11-27 NOTE — Progress Notes (Signed)
Labs drawn

## 2017-11-28 ENCOUNTER — Ambulatory Visit (INDEPENDENT_AMBULATORY_CARE_PROVIDER_SITE_OTHER): Payer: BLUE CROSS/BLUE SHIELD | Admitting: Internal Medicine

## 2017-11-28 ENCOUNTER — Encounter: Payer: Self-pay | Admitting: Internal Medicine

## 2017-11-28 VITALS — BP 112/50 | HR 78 | Temp 98.1°F | Wt 143.8 lb

## 2017-11-28 DIAGNOSIS — E039 Hypothyroidism, unspecified: Secondary | ICD-10-CM

## 2017-11-28 DIAGNOSIS — M349 Systemic sclerosis, unspecified: Secondary | ICD-10-CM | POA: Diagnosis not present

## 2017-11-28 DIAGNOSIS — I87331 Chronic venous hypertension (idiopathic) with ulcer and inflammation of right lower extremity: Secondary | ICD-10-CM | POA: Diagnosis not present

## 2017-11-28 DIAGNOSIS — I10 Essential (primary) hypertension: Secondary | ICD-10-CM | POA: Diagnosis not present

## 2017-11-28 NOTE — Patient Instructions (Signed)
Continue same dose of thyroid replacement.  Continue same medications and return in 6 months for 70-monthrecheck, fasting lipid panel TSH and office visit

## 2017-11-28 NOTE — Progress Notes (Signed)
   Subjective:    [name], female    DOB: 03/20/1951, 66 y.o.   MRN: [id number]  HPI Patient is here today to follow-up on recent diagnosis of hypothyroidism.  At her initial visit here in October TSH was elevated at 5.22 and had previously been 1.67 a year ago.  She was placed on low-dose thyroid replacement 0.05 mg and is here today for follow-up.  TSH is now 1.66.  Recently had surgery by Dr. Fredna Dow for necrotic changes right index finger that had progressed to level of PIP joint.  Diagnosed with spontaneous rupture of extensor tendon right hand.  Was diagnosed with dry gangrene.  Underwent ray resection right index finger  Blood pressures under good control at present time.    Review of Systems see above she feels well and has no side effects with thyroid replacement     Objective:   Physical Exam No thyromegaly       Assessment & Plan:  Scleroderma  Dry gangrene right index finger status post ray resection  Hypothyroidism-new onset with normal TSH on thyroid replacement  Plan: Continue current medications and return in 6 months for office visit and TSH along with blood pressure check.  Recommend lipid panel at that time as well.

## 2017-11-29 ENCOUNTER — Ambulatory Visit (HOSPITAL_COMMUNITY)
Admission: RE | Admit: 2017-11-29 | Discharge: 2017-11-29 | Disposition: A | Discharge status: Home / Self Care | Payer: BLUE CROSS/BLUE SHIELD | Source: Ambulatory Visit | Attending: Internal Medicine | Admitting: Internal Medicine

## 2017-11-29 DIAGNOSIS — M2548 Effusion, other site: Secondary | ICD-10-CM | POA: Diagnosis not present

## 2017-11-29 DIAGNOSIS — C8590 Non-Hodgkin lymphoma, unspecified, unspecified site: Secondary | ICD-10-CM | POA: Diagnosis not present

## 2017-11-29 DIAGNOSIS — G319 Degenerative disease of nervous system, unspecified: Secondary | ICD-10-CM | POA: Insufficient documentation

## 2017-11-29 DIAGNOSIS — Z8572 Personal history of non-Hodgkin lymphomas: Secondary | ICD-10-CM

## 2017-11-29 MED ORDER — GADOBENATE DIMEGLUMINE 529 MG/ML IV SOLN
10.0000 mL | Freq: Once | INTRAVENOUS | Status: AC | PRN
  Administered 2017-11-29: 7 mL via INTRAVENOUS

## 2017-12-02 ENCOUNTER — Encounter (HOSPITAL_BASED_OUTPATIENT_CLINIC_OR_DEPARTMENT_OTHER): Payer: BLUE CROSS/BLUE SHIELD | Attending: Internal Medicine

## 2017-12-02 DIAGNOSIS — L942 Calcinosis cutis: Secondary | ICD-10-CM | POA: Insufficient documentation

## 2017-12-02 DIAGNOSIS — I739 Peripheral vascular disease, unspecified: Secondary | ICD-10-CM | POA: Insufficient documentation

## 2017-12-02 DIAGNOSIS — N760 Acute vaginitis: Secondary | ICD-10-CM | POA: Insufficient documentation

## 2017-12-02 DIAGNOSIS — M349 Systemic sclerosis, unspecified: Secondary | ICD-10-CM | POA: Diagnosis not present

## 2017-12-02 DIAGNOSIS — I83222 Varicose veins of left lower extremity with both ulcer of calf and inflammation: Secondary | ICD-10-CM | POA: Diagnosis not present

## 2017-12-02 DIAGNOSIS — I10 Essential (primary) hypertension: Secondary | ICD-10-CM | POA: Diagnosis not present

## 2017-12-02 DIAGNOSIS — L97811 Non-pressure chronic ulcer of other part of right lower leg limited to breakdown of skin: Secondary | ICD-10-CM | POA: Insufficient documentation

## 2017-12-02 DIAGNOSIS — L97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: Secondary | ICD-10-CM | POA: Insufficient documentation

## 2017-12-02 DIAGNOSIS — I87331 Chronic venous hypertension (idiopathic) with ulcer and inflammation of right lower extremity: Secondary | ICD-10-CM | POA: Diagnosis not present

## 2017-12-02 DIAGNOSIS — I7301 Raynaud's syndrome with gangrene: Secondary | ICD-10-CM | POA: Diagnosis not present

## 2017-12-02 DIAGNOSIS — M069 Rheumatoid arthritis, unspecified: Secondary | ICD-10-CM | POA: Insufficient documentation

## 2017-12-16 ENCOUNTER — Ambulatory Visit (HOSPITAL_COMMUNITY)
Admission: RE | Admit: 2017-12-16 | Discharge: 2017-12-16 | Disposition: A | Discharge status: Home / Self Care | Payer: BLUE CROSS/BLUE SHIELD | Source: Ambulatory Visit | Attending: Surgery | Admitting: Surgery

## 2017-12-16 ENCOUNTER — Other Ambulatory Visit: Payer: Self-pay | Admitting: Internal Medicine

## 2017-12-16 DIAGNOSIS — Z882 Allergy status to sulfonamides status: Secondary | ICD-10-CM | POA: Insufficient documentation

## 2017-12-16 DIAGNOSIS — R600 Localized edema: Secondary | ICD-10-CM | POA: Diagnosis not present

## 2017-12-16 DIAGNOSIS — Z880 Allergy status to penicillin: Secondary | ICD-10-CM | POA: Diagnosis not present

## 2017-12-16 DIAGNOSIS — I872 Venous insufficiency (chronic) (peripheral): Secondary | ICD-10-CM | POA: Diagnosis not present

## 2017-12-16 DIAGNOSIS — I83222 Varicose veins of left lower extremity with both ulcer of calf and inflammation: Secondary | ICD-10-CM | POA: Diagnosis not present

## 2017-12-20 ENCOUNTER — Ambulatory Visit (HOSPITAL_BASED_OUTPATIENT_CLINIC_OR_DEPARTMENT_OTHER)
Admission: RE | Admit: 2017-12-20 | Discharge: 2017-12-20 | Disposition: A | Discharge status: Home / Self Care | Payer: BLUE CROSS/BLUE SHIELD | Source: Ambulatory Visit | Attending: Internal Medicine | Admitting: Internal Medicine

## 2017-12-20 ENCOUNTER — Ambulatory Visit (HOSPITAL_COMMUNITY)
Admission: RE | Admit: 2017-12-20 | Discharge: 2017-12-20 | Disposition: A | Discharge status: Home / Self Care | Payer: BLUE CROSS/BLUE SHIELD | Source: Ambulatory Visit | Attending: Internal Medicine | Admitting: Internal Medicine

## 2017-12-20 ENCOUNTER — Other Ambulatory Visit: Payer: Self-pay

## 2017-12-20 VITALS — BP 140/56 | HR 86 | Wt 143.4 lb

## 2017-12-20 DIAGNOSIS — Z882 Allergy status to sulfonamides status: Secondary | ICD-10-CM | POA: Insufficient documentation

## 2017-12-20 DIAGNOSIS — Z9221 Personal history of antineoplastic chemotherapy: Secondary | ICD-10-CM | POA: Insufficient documentation

## 2017-12-20 DIAGNOSIS — Z9981 Dependence on supplemental oxygen: Secondary | ICD-10-CM | POA: Insufficient documentation

## 2017-12-20 DIAGNOSIS — I2729 Other secondary pulmonary hypertension: Secondary | ICD-10-CM

## 2017-12-20 DIAGNOSIS — Z85820 Personal history of malignant melanoma of skin: Secondary | ICD-10-CM | POA: Insufficient documentation

## 2017-12-20 DIAGNOSIS — M349 Systemic sclerosis, unspecified: Secondary | ICD-10-CM

## 2017-12-20 DIAGNOSIS — I10 Essential (primary) hypertension: Secondary | ICD-10-CM | POA: Insufficient documentation

## 2017-12-20 DIAGNOSIS — R600 Localized edema: Secondary | ICD-10-CM | POA: Diagnosis not present

## 2017-12-20 DIAGNOSIS — Z8572 Personal history of non-Hodgkin lymphomas: Secondary | ICD-10-CM | POA: Diagnosis not present

## 2017-12-20 DIAGNOSIS — Z881 Allergy status to other antibiotic agents status: Secondary | ICD-10-CM | POA: Diagnosis not present

## 2017-12-20 DIAGNOSIS — J849 Interstitial pulmonary disease, unspecified: Secondary | ICD-10-CM | POA: Diagnosis not present

## 2017-12-20 DIAGNOSIS — Z7902 Long term (current) use of antithrombotics/antiplatelets: Secondary | ICD-10-CM | POA: Diagnosis not present

## 2017-12-20 DIAGNOSIS — Z880 Allergy status to penicillin: Secondary | ICD-10-CM | POA: Insufficient documentation

## 2017-12-20 DIAGNOSIS — Z79899 Other long term (current) drug therapy: Secondary | ICD-10-CM | POA: Diagnosis not present

## 2017-12-20 DIAGNOSIS — Z7952 Long term (current) use of systemic steroids: Secondary | ICD-10-CM | POA: Diagnosis not present

## 2017-12-20 DIAGNOSIS — I2721 Secondary pulmonary arterial hypertension: Secondary | ICD-10-CM | POA: Diagnosis not present

## 2017-12-20 DIAGNOSIS — I779 Disorder of arteries and arterioles, unspecified: Secondary | ICD-10-CM | POA: Diagnosis not present

## 2017-12-20 DIAGNOSIS — Z89021 Acquired absence of right finger(s): Secondary | ICD-10-CM | POA: Diagnosis not present

## 2017-12-20 DIAGNOSIS — I739 Peripheral vascular disease, unspecified: Secondary | ICD-10-CM | POA: Insufficient documentation

## 2017-12-20 DIAGNOSIS — Z79890 Hormone replacement therapy (postmenopausal): Secondary | ICD-10-CM | POA: Diagnosis not present

## 2017-12-20 LAB — ECHOCARDIOGRAM COMPLETE
E decel time: 292 msec
E/e' ratio: 8.33
FS: 47 % — AB (ref 28–44)
IVS/LV PW RATIO, ED: 0.75
LA ID, A-P, ES: 41 mm
LA diam end sys: 41 mm
LA diam index: 2.32 cm/m2
LA vol A4C: 55.8 ml
LA vol index: 28.4 mL/m2
LA vol: 50.1 mL
LV E/e' medial: 8.33
LV E/e'average: 8.33
LV PW d: 12 mm — AB (ref 0.6–1.1)
LV e' LATERAL: 8.27 cm/s
LVOT SV: 90 mL
LVOT VTI: 28.7 cm
LVOT area: 3.14 cm2
LVOT diameter: 20 mm
LVOT peak grad rest: 8 mmHg
LVOT peak vel: 137 cm/s
Lateral S' vel: 9.57 cm/s
MV Dec: 292
MV pk A vel: 79.5 m/s
MV pk E vel: 68.9 m/s
PV Reg grad dias: 18 mmHg
PV Reg vel dias: 212 cm/s
TAPSE: 17.5 mm
TDI e' lateral: 8.27
TDI e' medial: 5.66

## 2017-12-20 NOTE — Progress Notes (Signed)
  Echocardiogram 2D Echocardiogram has been performed.  [name] [name] 12/20/2017, 1:59 PM

## 2017-12-20 NOTE — Progress Notes (Signed)
PULMONARY HYPERTENSION [facility]IN[facility]  [name], female   DOB: [birth year], 66 y.o.   MRN: [id number] Primary Cardiologist: Dr. [name] Pulmonologist: Dr. Lamonte Sakai  HPI: [name] [name] is a 66 y.o. 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   PAH Meds: 1. started on IV epoprostenol (Veletri) in 01/2013. She had intolerable side effects with Veletri.  Ad[facility] to [facility]/17/13 through 12/19 for Veletri wean and Tyvaso initiation. 2.  Stopped Tyvaso in 2015. 3. February 2016 switched bosentan to Macitentan 4. Now on Macitentan 10 and sildenafil 40 tid (did not tolerate 60 tid or Adcirca).  5. Selexipag added 5/18 - stopped due to SEs  Here for f/u: Had to stop selexipag due to side effects and flu-like symptoms. Doing well from [facility] standpoint. No dizziness.  Struggling with recurrent wound on leg.  Still with severe neuropathy. Continues with wearing O2. Denies edema. Takes torsemide 40 bid. 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 Limited by legs. Sats down to 85% on 5L (sats at rest 95%)  6 MW 3/15 915 feet (279 m) 6 MW 2/16 930 feet (283 m)  Echo 10/13 LV EF 65-70%. RV severely enlarged/HK severe TR. RVSP 20mHg Echo (10/15) with EF 60-65%, D-shaped interventricular septum, moderately dilated RV with moderately decreased systolic function, PA systolic pressure 72 mmHg, IVC not dilated.  Echo (5/17) with EF 65-70%, Mildly flattened septum, severely dilated RV with moderately decreased systolic function, PA systolic pressure 68 mmHg, IVC not dilated.   RHC 04/19/17  RA = 4 RV = 80/8 PA = 79/17 (43) PCW = 6 Fick cardiac output/index = 4.1/2.4 PVR = 9.0 WU Ao sat =  94% PA sat = 59%, 62%  Assessment:  1. Moderate PAH   RHC 04/18/12  RA = 9  RV = 75/11/14  PA = 71/25 (44)  PCW = 4  Fick cardiac output/index = 3.0/1.7  Thermo CO/CI = 2.7/1.5  PVR = 13.3 Woods  FA sat = 79% on room air (checked 3 times). Sat with finger probe on air 94%  PA sat = 41%, 43%   RHC 11/14/12  RA = 14  RV = 84/8/20  PA = 83/29 (50)  PCW = 10  Fick cardiac output/index = 4.5/2.5  Thermo CO/CI = 3.8/2.2  PVR = 8.9 Woods (Fick)  FA sat = 86% on 3L  PA sat = 55%, 57  ABI 4/15: R 0.86 L 1.0   Labs (3/15): K 3.9, creatinine 1.0, LFTs normal Labs (05/17/14): K 3.4 Cr 1.1 Bun 40 LFTs ok  Labs (6/15): K 4.3, creatinine 1.15 Labs (8/15): creatinine 1.4 Labs (10/15): LFTs normal Labs (2/16): LFTs normal  Review of Systems: All pertinent positives and negatives as in HPI, otherwise negative.   Past Medical History  Diagnosis Date  . History of chicken pox   . Hypertension   . Sclerodermia generalized ([place]   . Non Hodgkin's lymphoma ([place]     Treated with chemo 2010, felt to be cured.  . Melanoma ([place] 1999  . Peripheral arterial occlusive disease ([facility]   . 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[place]   . DOE (dyspnea on exertion)   . PAH (pulmonary artery hypertension) ([place]   .  Epistaxis   . Pulmonary hypertension ([place])     Prior to Admission medications   Medication Sig Start Date End Date Taking? Authorizing Provider  acetaminophen (TYLENOL ARTHRITIS PAIN) 650 MG CR tablet Take 1,300 mg by mouth every 8 (eight) hours.    Yes [provider]  atorvastatin (LIPITOR) 10 MG tablet Take 1 tablet (10 mg total) by mouth daily. 06/26/17  Yes Avelardo Reesman, Shaune Pascal, MD  Biotin 5000 MCG TABS Take 5,000 mcg by mouth daily.   Yes [provider]  CALCIUM-MAGNESIUM-VITAMIN D PO Take 2 tablets by mouth daily.   Yes [provider]  Cholecalciferol (VITAMIN D) 2000 units CAPS Take 2,000 Units by mouth daily.   Yes [provider]  citalopram  (CELEXA) 20 MG tablet Take 1 tablet (20 mg total) by mouth at bedtime. 09/10/17  Yes Hoyt Koch, MD  clopidogrel (PLAVIX) 75 MG tablet Take 1 tablet (75 mg total) by mouth daily. 06/24/17  Yes Toniann Dickerson, Shaune Pascal, MD  fish oil-omega-3 fatty acids 1000 MG capsule Take 1,000 mg by mouth 2 (two) times daily.    Yes [provider]  gabapentin (NEURONTIN) 300 MG capsule take 1 capsules by mouth every morning and 2 every evening Patient taking differently: Take 600 mg in the morning and 300 mg in the evening 03/07/17  Yes Hoyt Koch, MD  iron polysaccharides (POLY-IRON 150) 150 MG capsule Take 1 capsule (150 mg total) by mouth 2 (two) times daily. 10/31/17  Yes Baxley, Cresenciano Lick, MD  levothyroxine (SYNTHROID, LEVOTHROID) 50 MCG tablet Take 50 mcg by mouth daily before breakfast.   Yes [provider]  Macitentan (OPSUMIT) 10 MG TABS Take 1 tablet (10 mg total) by mouth daily. 03/26/17  Yes Lucifer Soja, Shaune Pascal, MD  Multiple Vitamin (MULTIVITAMIN WITH MINERALS) TABS tablet Take 1 tablet by mouth daily.   Yes [provider]  oxyCODONE-acetaminophen (ROXICET) 5-325 MG tablet Take 1 tablet by mouth every 4 (four) hours as needed. 10/24/17  Yes Daryll Brod, MD  OXYGEN Inhale 2-3 L into the lungs continuous.    Yes [provider]  pantoprazole (PROTONIX) 40 MG tablet Take 1 tablet (40 mg total) by mouth 2 (two) times daily. 05/08/16  Yes Robbie Lis, MD  potassium chloride (K-DUR) 10 MEQ tablet Take 1 tablet (10 mEq total) by mouth daily. 06/24/17  Yes Milani Lowenstein, Shaune Pascal, MD  predniSONE (DELTASONE) 5 MG tablet Take 5 mg by mouth daily with breakfast.   Yes [provider]  Probiotic CAPS Take 1 capsule by mouth daily.   Yes [provider]  sildenafil (REVATIO) 20 MG tablet TAKE 2 TABLETS BY MOUTH THREE TIMES DAILY 09/23/17  Yes Jariya Reichow, Shaune Pascal, MD  spironolactone (ALDACTONE) 25 MG tablet Take 1 tablet (25 mg total) by mouth daily. 06/26/17   Yes Nakota Ackert, Shaune Pascal, MD  torsemide (DEMADEX) 20 MG tablet Take 2 tablets (40 mg total) by mouth 2 (two) times daily. 06/26/17  Yes Bailey Faiella, Shaune Pascal, MD  vitamin B-12 (CYANOCOBALAMIN) 1000 MCG tablet Take 1,000 mcg by mouth daily.   Yes [provider]     Allergies  Allergen Reactions  . Sulfa Antibiotics     Severe rash with significant peeling of face. No airway involvement per patient  . Levaquin [Levofloxacin In D5w] Other (See Comments)    Other reaction(s): Other (See Comments) FLU LIKE SYMPTOM  . Penicillins Rash     Filed Vitals:   05/01/16 1357  BP: 138/70  Pulse: 75  Weight: 135 lb (61.236 kg)    PHYSICAL EXAM: General: No acute distress No respiratory difficulty HEENT: normal anicteric Neck: supple. JVP 7-8 . Carotids 2+ bilat; no bruits. No lymphadenopathy or thryomegaly appreciated. Cor: PMI nondisplaced.  RRR 2/6 TR with RV lift. Loud P2 Lungs: clear on 4L decreased breath sounds throughout Abdomen: soft NT/ND no HSM. Good BS Extremities: Severe sclerodactyly with extensive joint deformity. + diffusegouty nodules. R index finger with stitches at previous amputation site  BLE with 2+ edema Neuro: alert & oriented x 3, cranial nerves grossly intact. moves all 4 extremities w/o difficulty. Affect pleasant   ASSESSMENT & PLAN:  1. 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  Unable to tolerate IV epoprostenol and with no improvement on Tyvaso.  She stopped Tyvaso and 6 minute walkshowed no deterioration.  She remains on Revatio and macitentan.  WHO class II-III dyspnea.  Last echo showed moderately dilated and moderately dysfunctional RV with persistent severe pulmonary hypertension.   - Recent RHC as above -has now failed Selexipag as well as uptitration of her other PAH meds. Only other option would be to try to switch sildenafil to Adempas but will defer for now given intolerances - Echo today reviewed personally. Mild RV strain. Unable to estimate RVSP  due to lack of significant TR  - Stable NYHA III. She has been remarkably resilient in the face of her extensive severe medical issues - Volume status ok today. Continue torsemide - Continue home oxygen.  - Unable to do 6MW 2. Scleroderma: Followed by Dr. Amil Amen. 3. Chronic L Ulceration: Followed by Wound Care.   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 PAD: Follows with [facility]. Continue Plavix  5. Interstitial lung disease: In setting of scleroderma.  She is on home oxygen and followed by Dr. Ocie Doyne, MD  2:33 PM

## 2017-12-20 NOTE — Patient Instructions (Signed)
Your physician recommends that you schedule a follow-up appointment in: 6 months with Dr. Aundra Dubin  ( we will call you)

## 2017-12-26 ENCOUNTER — Encounter: Payer: Self-pay | Admitting: Internal Medicine

## 2017-12-26 ENCOUNTER — Ambulatory Visit (INDEPENDENT_AMBULATORY_CARE_PROVIDER_SITE_OTHER): Payer: BLUE CROSS/BLUE SHIELD | Admitting: Internal Medicine

## 2017-12-26 VITALS — BP 140/52 | HR 78 | Wt 145.0 lb

## 2017-12-26 DIAGNOSIS — M79672 Pain in left foot: Secondary | ICD-10-CM

## 2017-12-26 DIAGNOSIS — M349 Systemic sclerosis, unspecified: Secondary | ICD-10-CM

## 2017-12-26 DIAGNOSIS — I779 Disorder of arteries and arterioles, unspecified: Secondary | ICD-10-CM

## 2017-12-26 MED ORDER — METHYLPREDNISOLONE ACETATE 80 MG/ML IJ SUSP
80.0000 mg | Freq: Once | INTRAMUSCULAR | Status: AC
  Administered 2017-12-26: 80 mg via INTRAMUSCULAR

## 2017-12-27 NOTE — Progress Notes (Signed)
   Subjective:    [name], female    DOB: 1951/04/15, 66 y.o.   MRN: [id number]  HPI Patient having issues with her foot.  She and her husband went with another couple to [place] and did a lot of walking.  Pain is on the bottom of her left foot.  It was there before her trip to [place] for several weeks and now worse after the trip to [place].  Present regardless of the type of shoes she wears.  She has a history of severe scleroderma.    Review of Systems see above     Objective:   Physical Exam Significant point tenderness along  medial aspect of left foot plantar aspect.  No palpable mass.  No ulcer.  No increased warmth.       Assessment & Plan:   Soft tissue inflammation left foot-seems higher than normal plantar fasciitis pain  Scleroderma  Plan: I suggested x-ray but patient declined.  Area was injected with Depo-Medrol Marcaine and Xylocaine.  She will call if not progressing well in the next several days.

## 2017-12-30 DIAGNOSIS — I83222 Varicose veins of left lower extremity with both ulcer of calf and inflammation: Secondary | ICD-10-CM | POA: Diagnosis not present

## 2017-12-30 NOTE — Patient Instructions (Addendum)
Foot injected with Depo-Medrol, Marcaine, and Xylocaine.  Call if not better in 5-7 days or sooner if worse.

## 2018-01-02 ENCOUNTER — Other Ambulatory Visit: Payer: Self-pay

## 2018-01-02 MED ORDER — LEVOTHYROXINE SODIUM 50 MCG PO TABS: 50.0000 ug | ORAL_TABLET | Freq: Every day | ORAL | 3 refills | Status: DC

## 2018-01-06 ENCOUNTER — Encounter (HOSPITAL_BASED_OUTPATIENT_CLINIC_OR_DEPARTMENT_OTHER): Payer: Medicare Other | Attending: Internal Medicine

## 2018-01-06 DIAGNOSIS — L97312 Non-pressure chronic ulcer of right ankle with fat layer exposed: Secondary | ICD-10-CM | POA: Insufficient documentation

## 2018-01-06 DIAGNOSIS — I739 Peripheral vascular disease, unspecified: Secondary | ICD-10-CM | POA: Diagnosis not present

## 2018-01-06 DIAGNOSIS — I10 Essential (primary) hypertension: Secondary | ICD-10-CM | POA: Insufficient documentation

## 2018-01-06 DIAGNOSIS — I87331 Chronic venous hypertension (idiopathic) with ulcer and inflammation of right lower extremity: Secondary | ICD-10-CM | POA: Diagnosis not present

## 2018-01-06 DIAGNOSIS — M349 Systemic sclerosis, unspecified: Secondary | ICD-10-CM | POA: Insufficient documentation

## 2018-01-06 DIAGNOSIS — M069 Rheumatoid arthritis, unspecified: Secondary | ICD-10-CM | POA: Insufficient documentation

## 2018-01-06 DIAGNOSIS — L97812 Non-pressure chronic ulcer of other part of right lower leg with fat layer exposed: Secondary | ICD-10-CM | POA: Insufficient documentation

## 2018-01-06 DIAGNOSIS — I7301 Raynaud's syndrome with gangrene: Secondary | ICD-10-CM | POA: Insufficient documentation

## 2018-01-08 DIAGNOSIS — L905 Scar conditions and fibrosis of skin: Secondary | ICD-10-CM | POA: Diagnosis not present

## 2018-01-08 DIAGNOSIS — Z85828 Personal history of other malignant neoplasm of skin: Secondary | ICD-10-CM | POA: Diagnosis not present

## 2018-01-08 DIAGNOSIS — L570 Actinic keratosis: Secondary | ICD-10-CM | POA: Diagnosis not present

## 2018-01-15 DIAGNOSIS — M340 Progressive systemic sclerosis: Secondary | ICD-10-CM | POA: Diagnosis not present

## 2018-01-15 DIAGNOSIS — M79672 Pain in left foot: Secondary | ICD-10-CM | POA: Diagnosis not present

## 2018-01-15 DIAGNOSIS — G629 Polyneuropathy, unspecified: Secondary | ICD-10-CM | POA: Diagnosis not present

## 2018-01-15 DIAGNOSIS — I7301 Raynaud's syndrome with gangrene: Secondary | ICD-10-CM | POA: Diagnosis not present

## 2018-01-15 DIAGNOSIS — S81801D Unspecified open wound, right lower leg, subsequent encounter: Secondary | ICD-10-CM | POA: Diagnosis not present

## 2018-01-15 DIAGNOSIS — Z6821 Body mass index (BMI) 21.0-21.9, adult: Secondary | ICD-10-CM | POA: Diagnosis not present

## 2018-01-15 DIAGNOSIS — K219 Gastro-esophageal reflux disease without esophagitis: Secondary | ICD-10-CM | POA: Diagnosis not present

## 2018-01-15 DIAGNOSIS — D638 Anemia in other chronic diseases classified elsewhere: Secondary | ICD-10-CM | POA: Diagnosis not present

## 2018-01-20 DIAGNOSIS — I87311 Chronic venous hypertension (idiopathic) with ulcer of right lower extremity: Secondary | ICD-10-CM | POA: Diagnosis not present

## 2018-01-20 DIAGNOSIS — L97312 Non-pressure chronic ulcer of right ankle with fat layer exposed: Secondary | ICD-10-CM | POA: Diagnosis not present

## 2018-01-20 DIAGNOSIS — L97812 Non-pressure chronic ulcer of other part of right lower leg with fat layer exposed: Secondary | ICD-10-CM | POA: Diagnosis not present

## 2018-01-20 DIAGNOSIS — I10 Essential (primary) hypertension: Secondary | ICD-10-CM | POA: Diagnosis not present

## 2018-01-20 DIAGNOSIS — I739 Peripheral vascular disease, unspecified: Secondary | ICD-10-CM | POA: Diagnosis not present

## 2018-01-20 DIAGNOSIS — M349 Systemic sclerosis, unspecified: Secondary | ICD-10-CM | POA: Diagnosis not present

## 2018-01-20 DIAGNOSIS — I87331 Chronic venous hypertension (idiopathic) with ulcer and inflammation of right lower extremity: Secondary | ICD-10-CM | POA: Diagnosis not present

## 2018-01-23 ENCOUNTER — Other Ambulatory Visit (HOSPITAL_COMMUNITY): Payer: Self-pay | Admitting: *Deleted

## 2018-01-23 MED ORDER — SILDENAFIL CITRATE 20 MG PO TABS: 40.0000 mg | ORAL_TABLET | Freq: Three times a day (TID) | ORAL | 11 refills | Status: DC

## 2018-01-27 ENCOUNTER — Telehealth (HOSPITAL_COMMUNITY): Payer: Self-pay | Admitting: Pharmacist

## 2018-01-27 NOTE — Telephone Encounter (Signed)
Sildenafil 40 TID PA approved by [employer] through 12/30/18.   Ruta Hinds. Velva Harman, PharmD, BCPS, CPP Clinical Pharmacist Phone: [phone] 01/27/2018 10:24 AM

## 2018-02-03 ENCOUNTER — Encounter (HOSPITAL_BASED_OUTPATIENT_CLINIC_OR_DEPARTMENT_OTHER): Payer: Medicare Other | Attending: Internal Medicine

## 2018-02-03 DIAGNOSIS — L97812 Non-pressure chronic ulcer of other part of right lower leg with fat layer exposed: Secondary | ICD-10-CM | POA: Diagnosis not present

## 2018-02-03 DIAGNOSIS — L97312 Non-pressure chronic ulcer of right ankle with fat layer exposed: Secondary | ICD-10-CM | POA: Insufficient documentation

## 2018-02-03 DIAGNOSIS — I10 Essential (primary) hypertension: Secondary | ICD-10-CM | POA: Diagnosis not present

## 2018-02-03 DIAGNOSIS — L97212 Non-pressure chronic ulcer of right calf with fat layer exposed: Secondary | ICD-10-CM | POA: Insufficient documentation

## 2018-02-03 DIAGNOSIS — I87311 Chronic venous hypertension (idiopathic) with ulcer of right lower extremity: Secondary | ICD-10-CM | POA: Diagnosis not present

## 2018-02-03 DIAGNOSIS — I87331 Chronic venous hypertension (idiopathic) with ulcer and inflammation of right lower extremity: Secondary | ICD-10-CM | POA: Insufficient documentation

## 2018-02-04 ENCOUNTER — Ambulatory Visit: Payer: BLUE CROSS/BLUE SHIELD | Admitting: Vascular Surgery

## 2018-02-10 DIAGNOSIS — I10 Essential (primary) hypertension: Secondary | ICD-10-CM | POA: Diagnosis not present

## 2018-02-10 DIAGNOSIS — L97312 Non-pressure chronic ulcer of right ankle with fat layer exposed: Secondary | ICD-10-CM | POA: Diagnosis not present

## 2018-02-10 DIAGNOSIS — L97812 Non-pressure chronic ulcer of other part of right lower leg with fat layer exposed: Secondary | ICD-10-CM | POA: Diagnosis not present

## 2018-02-10 DIAGNOSIS — L97215 Non-pressure chronic ulcer of right calf with muscle involvement without evidence of necrosis: Secondary | ICD-10-CM | POA: Diagnosis not present

## 2018-02-10 DIAGNOSIS — I87331 Chronic venous hypertension (idiopathic) with ulcer and inflammation of right lower extremity: Secondary | ICD-10-CM | POA: Diagnosis not present

## 2018-02-10 DIAGNOSIS — I87311 Chronic venous hypertension (idiopathic) with ulcer of right lower extremity: Secondary | ICD-10-CM | POA: Diagnosis not present

## 2018-02-10 DIAGNOSIS — L97212 Non-pressure chronic ulcer of right calf with fat layer exposed: Secondary | ICD-10-CM | POA: Diagnosis not present

## 2018-02-17 DIAGNOSIS — L03115 Cellulitis of right lower limb: Secondary | ICD-10-CM | POA: Diagnosis not present

## 2018-02-17 DIAGNOSIS — L97812 Non-pressure chronic ulcer of other part of right lower leg with fat layer exposed: Secondary | ICD-10-CM | POA: Diagnosis not present

## 2018-02-17 DIAGNOSIS — L97212 Non-pressure chronic ulcer of right calf with fat layer exposed: Secondary | ICD-10-CM | POA: Diagnosis not present

## 2018-02-17 DIAGNOSIS — I87331 Chronic venous hypertension (idiopathic) with ulcer and inflammation of right lower extremity: Secondary | ICD-10-CM | POA: Diagnosis not present

## 2018-02-17 DIAGNOSIS — L97312 Non-pressure chronic ulcer of right ankle with fat layer exposed: Secondary | ICD-10-CM | POA: Diagnosis not present

## 2018-02-17 DIAGNOSIS — I10 Essential (primary) hypertension: Secondary | ICD-10-CM | POA: Diagnosis not present

## 2018-02-24 DIAGNOSIS — I10 Essential (primary) hypertension: Secondary | ICD-10-CM | POA: Diagnosis not present

## 2018-02-24 DIAGNOSIS — L97312 Non-pressure chronic ulcer of right ankle with fat layer exposed: Secondary | ICD-10-CM | POA: Diagnosis not present

## 2018-02-24 DIAGNOSIS — I87331 Chronic venous hypertension (idiopathic) with ulcer and inflammation of right lower extremity: Secondary | ICD-10-CM | POA: Diagnosis not present

## 2018-02-24 DIAGNOSIS — L97212 Non-pressure chronic ulcer of right calf with fat layer exposed: Secondary | ICD-10-CM | POA: Diagnosis not present

## 2018-02-24 DIAGNOSIS — L97812 Non-pressure chronic ulcer of other part of right lower leg with fat layer exposed: Secondary | ICD-10-CM | POA: Diagnosis not present

## 2018-02-24 DIAGNOSIS — I87311 Chronic venous hypertension (idiopathic) with ulcer of right lower extremity: Secondary | ICD-10-CM | POA: Diagnosis not present

## 2018-02-28 ENCOUNTER — Other Ambulatory Visit (HOSPITAL_COMMUNITY): Payer: Self-pay | Admitting: *Deleted

## 2018-02-28 DIAGNOSIS — I2729 Other secondary pulmonary hypertension: Secondary | ICD-10-CM

## 2018-02-28 MED ORDER — MACITENTAN 10 MG PO TABS: 10.0000 mg | ORAL_TABLET | Freq: Every day | ORAL | 3 refills | Status: DC

## 2018-03-03 ENCOUNTER — Encounter (HOSPITAL_BASED_OUTPATIENT_CLINIC_OR_DEPARTMENT_OTHER): Payer: Medicare Other | Attending: Internal Medicine

## 2018-03-03 DIAGNOSIS — I87311 Chronic venous hypertension (idiopathic) with ulcer of right lower extremity: Secondary | ICD-10-CM | POA: Diagnosis not present

## 2018-03-03 DIAGNOSIS — I7300 Raynaud's syndrome without gangrene: Secondary | ICD-10-CM | POA: Diagnosis not present

## 2018-03-03 DIAGNOSIS — L97312 Non-pressure chronic ulcer of right ankle with fat layer exposed: Secondary | ICD-10-CM | POA: Diagnosis not present

## 2018-03-03 DIAGNOSIS — I739 Peripheral vascular disease, unspecified: Secondary | ICD-10-CM | POA: Insufficient documentation

## 2018-03-03 DIAGNOSIS — L97812 Non-pressure chronic ulcer of other part of right lower leg with fat layer exposed: Secondary | ICD-10-CM | POA: Insufficient documentation

## 2018-03-03 DIAGNOSIS — M069 Rheumatoid arthritis, unspecified: Secondary | ICD-10-CM | POA: Insufficient documentation

## 2018-03-03 DIAGNOSIS — I87331 Chronic venous hypertension (idiopathic) with ulcer and inflammation of right lower extremity: Secondary | ICD-10-CM | POA: Insufficient documentation

## 2018-03-03 DIAGNOSIS — L942 Calcinosis cutis: Secondary | ICD-10-CM | POA: Insufficient documentation

## 2018-03-03 DIAGNOSIS — L97215 Non-pressure chronic ulcer of right calf with muscle involvement without evidence of necrosis: Secondary | ICD-10-CM | POA: Diagnosis not present

## 2018-03-03 DIAGNOSIS — I10 Essential (primary) hypertension: Secondary | ICD-10-CM | POA: Insufficient documentation

## 2018-03-10 DIAGNOSIS — I87331 Chronic venous hypertension (idiopathic) with ulcer and inflammation of right lower extremity: Secondary | ICD-10-CM | POA: Diagnosis not present

## 2018-03-10 DIAGNOSIS — I10 Essential (primary) hypertension: Secondary | ICD-10-CM | POA: Diagnosis not present

## 2018-03-10 DIAGNOSIS — L97312 Non-pressure chronic ulcer of right ankle with fat layer exposed: Secondary | ICD-10-CM | POA: Diagnosis not present

## 2018-03-10 DIAGNOSIS — L942 Calcinosis cutis: Secondary | ICD-10-CM | POA: Diagnosis not present

## 2018-03-10 DIAGNOSIS — L97812 Non-pressure chronic ulcer of other part of right lower leg with fat layer exposed: Secondary | ICD-10-CM | POA: Diagnosis not present

## 2018-03-10 DIAGNOSIS — I7300 Raynaud's syndrome without gangrene: Secondary | ICD-10-CM | POA: Diagnosis not present

## 2018-03-10 DIAGNOSIS — I87311 Chronic venous hypertension (idiopathic) with ulcer of right lower extremity: Secondary | ICD-10-CM | POA: Diagnosis not present

## 2018-03-10 DIAGNOSIS — L97215 Non-pressure chronic ulcer of right calf with muscle involvement without evidence of necrosis: Secondary | ICD-10-CM | POA: Diagnosis not present

## 2018-03-12 ENCOUNTER — Other Ambulatory Visit: Payer: Self-pay

## 2018-03-12 MED ORDER — CITALOPRAM HYDROBROMIDE 20 MG PO TABS: 20.0000 mg | ORAL_TABLET | Freq: Every day | ORAL | 3 refills | Status: DC

## 2018-03-17 DIAGNOSIS — L97812 Non-pressure chronic ulcer of other part of right lower leg with fat layer exposed: Secondary | ICD-10-CM | POA: Diagnosis not present

## 2018-03-17 DIAGNOSIS — L97212 Non-pressure chronic ulcer of right calf with fat layer exposed: Secondary | ICD-10-CM | POA: Diagnosis not present

## 2018-03-17 DIAGNOSIS — I87331 Chronic venous hypertension (idiopathic) with ulcer and inflammation of right lower extremity: Secondary | ICD-10-CM | POA: Diagnosis not present

## 2018-03-17 DIAGNOSIS — I87311 Chronic venous hypertension (idiopathic) with ulcer of right lower extremity: Secondary | ICD-10-CM | POA: Diagnosis not present

## 2018-03-17 DIAGNOSIS — I10 Essential (primary) hypertension: Secondary | ICD-10-CM | POA: Diagnosis not present

## 2018-03-17 DIAGNOSIS — L97312 Non-pressure chronic ulcer of right ankle with fat layer exposed: Secondary | ICD-10-CM | POA: Diagnosis not present

## 2018-03-17 DIAGNOSIS — I7300 Raynaud's syndrome without gangrene: Secondary | ICD-10-CM | POA: Diagnosis not present

## 2018-03-17 DIAGNOSIS — L942 Calcinosis cutis: Secondary | ICD-10-CM | POA: Diagnosis not present

## 2018-03-24 DIAGNOSIS — I10 Essential (primary) hypertension: Secondary | ICD-10-CM | POA: Diagnosis not present

## 2018-03-24 DIAGNOSIS — L97312 Non-pressure chronic ulcer of right ankle with fat layer exposed: Secondary | ICD-10-CM | POA: Diagnosis not present

## 2018-03-24 DIAGNOSIS — L97212 Non-pressure chronic ulcer of right calf with fat layer exposed: Secondary | ICD-10-CM | POA: Diagnosis not present

## 2018-03-24 DIAGNOSIS — L942 Calcinosis cutis: Secondary | ICD-10-CM | POA: Diagnosis not present

## 2018-03-24 DIAGNOSIS — L97812 Non-pressure chronic ulcer of other part of right lower leg with fat layer exposed: Secondary | ICD-10-CM | POA: Diagnosis not present

## 2018-03-24 DIAGNOSIS — I87311 Chronic venous hypertension (idiopathic) with ulcer of right lower extremity: Secondary | ICD-10-CM | POA: Diagnosis not present

## 2018-03-24 DIAGNOSIS — I87331 Chronic venous hypertension (idiopathic) with ulcer and inflammation of right lower extremity: Secondary | ICD-10-CM | POA: Diagnosis not present

## 2018-03-24 DIAGNOSIS — I7300 Raynaud's syndrome without gangrene: Secondary | ICD-10-CM | POA: Diagnosis not present

## 2018-03-25 ENCOUNTER — Other Ambulatory Visit (HOSPITAL_COMMUNITY): Payer: Self-pay | Admitting: *Deleted

## 2018-03-25 MED ORDER — CLOPIDOGREL BISULFATE 75 MG PO TABS: 75.0000 mg | ORAL_TABLET | Freq: Every day | ORAL | 3 refills | Status: DC

## 2018-03-31 ENCOUNTER — Encounter (HOSPITAL_BASED_OUTPATIENT_CLINIC_OR_DEPARTMENT_OTHER): Payer: Medicare Other | Attending: Internal Medicine

## 2018-03-31 DIAGNOSIS — I87331 Chronic venous hypertension (idiopathic) with ulcer and inflammation of right lower extremity: Secondary | ICD-10-CM | POA: Diagnosis not present

## 2018-03-31 DIAGNOSIS — I7300 Raynaud's syndrome without gangrene: Secondary | ICD-10-CM | POA: Insufficient documentation

## 2018-03-31 DIAGNOSIS — I7301 Raynaud's syndrome with gangrene: Secondary | ICD-10-CM | POA: Insufficient documentation

## 2018-03-31 DIAGNOSIS — L97212 Non-pressure chronic ulcer of right calf with fat layer exposed: Secondary | ICD-10-CM | POA: Diagnosis not present

## 2018-03-31 DIAGNOSIS — L97312 Non-pressure chronic ulcer of right ankle with fat layer exposed: Secondary | ICD-10-CM | POA: Insufficient documentation

## 2018-03-31 DIAGNOSIS — L97812 Non-pressure chronic ulcer of other part of right lower leg with fat layer exposed: Secondary | ICD-10-CM | POA: Diagnosis not present

## 2018-03-31 DIAGNOSIS — L942 Calcinosis cutis: Secondary | ICD-10-CM | POA: Diagnosis not present

## 2018-03-31 DIAGNOSIS — E1151 Type 2 diabetes mellitus with diabetic peripheral angiopathy without gangrene: Secondary | ICD-10-CM | POA: Diagnosis not present

## 2018-03-31 DIAGNOSIS — M069 Rheumatoid arthritis, unspecified: Secondary | ICD-10-CM | POA: Insufficient documentation

## 2018-03-31 DIAGNOSIS — I87311 Chronic venous hypertension (idiopathic) with ulcer of right lower extremity: Secondary | ICD-10-CM | POA: Diagnosis not present

## 2018-04-01 ENCOUNTER — Other Ambulatory Visit (HOSPITAL_COMMUNITY): Payer: Self-pay | Admitting: *Deleted

## 2018-04-01 DIAGNOSIS — I779 Disorder of arteries and arterioles, unspecified: Secondary | ICD-10-CM

## 2018-04-01 MED ORDER — ATORVASTATIN CALCIUM 10 MG PO TABS: 10.0000 mg | ORAL_TABLET | Freq: Every day | ORAL | 3 refills | Status: DC

## 2018-04-02 ENCOUNTER — Other Ambulatory Visit: Payer: Self-pay

## 2018-04-02 MED ORDER — LEVOTHYROXINE SODIUM 50 MCG PO TABS
50.0000 ug | ORAL_TABLET | Freq: Every day | ORAL | 1 refills | Status: DC
Start: 2018-04-02 — End: 2018-04-03

## 2018-04-02 NOTE — Telephone Encounter (Signed)
Refill x 6 months

## 2018-04-02 NOTE — Telephone Encounter (Signed)
Patient called request a refill on levothyroxine sent to a different pharmacy that she uses now which is [id number] [place], [place]

## 2018-04-03 ENCOUNTER — Other Ambulatory Visit: Payer: Self-pay

## 2018-04-03 MED ORDER — LEVOTHYROXINE SODIUM 50 MCG PO TABS: 50.0000 ug | ORAL_TABLET | Freq: Every day | ORAL | 1 refills | Status: DC

## 2018-04-07 DIAGNOSIS — M349 Systemic sclerosis, unspecified: Secondary | ICD-10-CM | POA: Diagnosis not present

## 2018-04-07 DIAGNOSIS — I96 Gangrene, not elsewhere classified: Secondary | ICD-10-CM | POA: Diagnosis not present

## 2018-04-08 DIAGNOSIS — L570 Actinic keratosis: Secondary | ICD-10-CM | POA: Diagnosis not present

## 2018-04-08 DIAGNOSIS — L814 Other melanin hyperpigmentation: Secondary | ICD-10-CM | POA: Diagnosis not present

## 2018-04-08 DIAGNOSIS — L821 Other seborrheic keratosis: Secondary | ICD-10-CM | POA: Diagnosis not present

## 2018-04-08 DIAGNOSIS — D225 Melanocytic nevi of trunk: Secondary | ICD-10-CM | POA: Diagnosis not present

## 2018-04-08 DIAGNOSIS — Z85820 Personal history of malignant melanoma of skin: Secondary | ICD-10-CM | POA: Diagnosis not present

## 2018-04-14 DIAGNOSIS — L97812 Non-pressure chronic ulcer of other part of right lower leg with fat layer exposed: Secondary | ICD-10-CM | POA: Diagnosis not present

## 2018-04-14 DIAGNOSIS — L97212 Non-pressure chronic ulcer of right calf with fat layer exposed: Secondary | ICD-10-CM | POA: Diagnosis not present

## 2018-04-14 DIAGNOSIS — I7301 Raynaud's syndrome with gangrene: Secondary | ICD-10-CM | POA: Diagnosis not present

## 2018-04-14 DIAGNOSIS — L942 Calcinosis cutis: Secondary | ICD-10-CM | POA: Diagnosis not present

## 2018-04-14 DIAGNOSIS — I87331 Chronic venous hypertension (idiopathic) with ulcer and inflammation of right lower extremity: Secondary | ICD-10-CM | POA: Diagnosis not present

## 2018-04-14 DIAGNOSIS — L97312 Non-pressure chronic ulcer of right ankle with fat layer exposed: Secondary | ICD-10-CM | POA: Diagnosis not present

## 2018-04-14 DIAGNOSIS — I87311 Chronic venous hypertension (idiopathic) with ulcer of right lower extremity: Secondary | ICD-10-CM | POA: Diagnosis not present

## 2018-04-14 DIAGNOSIS — E1151 Type 2 diabetes mellitus with diabetic peripheral angiopathy without gangrene: Secondary | ICD-10-CM | POA: Diagnosis not present

## 2018-04-15 DIAGNOSIS — K219 Gastro-esophageal reflux disease without esophagitis: Secondary | ICD-10-CM | POA: Diagnosis not present

## 2018-04-15 DIAGNOSIS — M810 Age-related osteoporosis without current pathological fracture: Secondary | ICD-10-CM | POA: Diagnosis not present

## 2018-04-15 DIAGNOSIS — Z6821 Body mass index (BMI) 21.0-21.9, adult: Secondary | ICD-10-CM | POA: Diagnosis not present

## 2018-04-15 DIAGNOSIS — M79672 Pain in left foot: Secondary | ICD-10-CM | POA: Diagnosis not present

## 2018-04-15 DIAGNOSIS — S81801D Unspecified open wound, right lower leg, subsequent encounter: Secondary | ICD-10-CM | POA: Diagnosis not present

## 2018-04-15 DIAGNOSIS — M340 Progressive systemic sclerosis: Secondary | ICD-10-CM | POA: Diagnosis not present

## 2018-04-15 DIAGNOSIS — G629 Polyneuropathy, unspecified: Secondary | ICD-10-CM | POA: Diagnosis not present

## 2018-04-15 DIAGNOSIS — I7301 Raynaud's syndrome with gangrene: Secondary | ICD-10-CM | POA: Diagnosis not present

## 2018-04-15 DIAGNOSIS — D638 Anemia in other chronic diseases classified elsewhere: Secondary | ICD-10-CM | POA: Diagnosis not present

## 2018-04-28 DIAGNOSIS — L97312 Non-pressure chronic ulcer of right ankle with fat layer exposed: Secondary | ICD-10-CM | POA: Diagnosis not present

## 2018-04-28 DIAGNOSIS — L942 Calcinosis cutis: Secondary | ICD-10-CM | POA: Diagnosis not present

## 2018-04-28 DIAGNOSIS — L97215 Non-pressure chronic ulcer of right calf with muscle involvement without evidence of necrosis: Secondary | ICD-10-CM | POA: Diagnosis not present

## 2018-04-28 DIAGNOSIS — I7301 Raynaud's syndrome with gangrene: Secondary | ICD-10-CM | POA: Diagnosis not present

## 2018-04-28 DIAGNOSIS — I87331 Chronic venous hypertension (idiopathic) with ulcer and inflammation of right lower extremity: Secondary | ICD-10-CM | POA: Diagnosis not present

## 2018-04-28 DIAGNOSIS — L97812 Non-pressure chronic ulcer of other part of right lower leg with fat layer exposed: Secondary | ICD-10-CM | POA: Diagnosis not present

## 2018-04-28 DIAGNOSIS — I87311 Chronic venous hypertension (idiopathic) with ulcer of right lower extremity: Secondary | ICD-10-CM | POA: Diagnosis not present

## 2018-04-28 DIAGNOSIS — E1151 Type 2 diabetes mellitus with diabetic peripheral angiopathy without gangrene: Secondary | ICD-10-CM | POA: Diagnosis not present

## 2018-04-30 ENCOUNTER — Ambulatory Visit (INDEPENDENT_AMBULATORY_CARE_PROVIDER_SITE_OTHER): Payer: Medicare Other | Admitting: Emergency Medicine

## 2018-04-30 ENCOUNTER — Encounter: Payer: Self-pay | Admitting: Emergency Medicine

## 2018-04-30 DIAGNOSIS — I2729 Other secondary pulmonary hypertension: Secondary | ICD-10-CM

## 2018-04-30 DIAGNOSIS — M349 Systemic sclerosis, unspecified: Secondary | ICD-10-CM | POA: Diagnosis not present

## 2018-04-30 DIAGNOSIS — J849 Interstitial pulmonary disease, unspecified: Secondary | ICD-10-CM

## 2018-04-30 DIAGNOSIS — J9611 Chronic respiratory failure with hypoxia: Secondary | ICD-10-CM

## 2018-04-30 DIAGNOSIS — I779 Disorder of arteries and arterioles, unspecified: Secondary | ICD-10-CM

## 2018-04-30 NOTE — Assessment & Plan Note (Signed)
Last echocardiogram did not estimate PASP but based on her RV function suspect that her pressures are stable.  Continue same regimen.

## 2018-04-30 NOTE — Progress Notes (Signed)
Subjective:    [name], female    DOB: [birth year], 67 y.o.   MRN: [id number] HPI 67 yo woman, never smoker, hx of scleroderma, Raynaud's, non-Hodgkins lymphoma (treated '10), HTN, PAH originally dx in [place] and treated, but meds d/c'd when she improved. Has been seen in by Dr [name], had R heart cath as below in 03/2012. follow up 03/2017.   ROV 07/22/17 -- this follow-up visit for patient with a history of scleroderma. She has secondary pulmonary hypertension that we have managed with sildenafil, macetentan. She added selexipag in May 2018. She experienced headache, has had abdominal discomfort and diarrhea - she had to stop it after about 2-3 weeks. Her last chest x-ray was on 10/31/16 and was stable. Stable on prednisone 54m qd. Was treated with a higher dose methylpred taper for ear fullness. Her exertional tolerance is stable. She uses her o2 at 2-3L/min. She does not have a 6 minute walk or TTE scheduled at this time.   ROV 10/23/17 --patient has a history of scleroderma with secondary pulmonary hypertension.  She is currently being treated with oxygen at 2-3 L/min.  Current regimen includes macitentan, sildenafil, torsemide 40 mg twice daily. She has ischemia to R second digit, will need an amputation under local. She is on pred 534m stable dose. Follow with Dr BeAmil AmenShe has a TTE in December. She has L foot pain, being worked up, wounds are healing. Unable to do 6 minute walk at this time.   04/30/2018 --pleasant [age]ear old woman with a history of scleroderma and associated secondary pulmonary arterial hypertension.  She has chronic hypoxemia and is on oxygen at 2 3 L/min.  She remains on prednisone 5 mg daily, follows with Dr. BeAmil Amenith rheumatology. She is having a lot of congestion and some dry cough from the pollen. She uses coricidin at night. She is on sildenafil, macitentan, torsemide. TTE from 11/2018 > moderate decreased RV fxn, no PASP estimated.    04/19/17 --  RA  = 4 RV = 80/8 PA = 79/17 (43) PCW = 6 Fick cardiac output/index = 4.1/2.4 PVR = 9.0 WU Ao sat = 94% PA sat = 59%, 62%   Underwent RHC on 04/18/12 as below:  RA = 9  RV = 75/11/14  PA = 71/25 (44)  PCW = 4  Fick cardiac output/index = 3.0/1.7  Thermo CO/CI = 2.7/1.5  PVR = 23.5 Woods  FA sat = 79% on room air (checked 3 times). Sat with finger probe on air 94%  PA sat = 41%, 43%  MW 04/18/12 880 feet  No results for input(s): HGB, HCT, WBC, PLT in the last 168 hours.    Review of Systems  Constitutional: Negative for appetite change, fever and unexpected weight change.  HENT: Negative for congestion, dental problem, ear pain, nosebleeds, postnasal drip, rhinorrhea, sinus pressure, sneezing, sore throat and trouble swallowing.   Eyes: Negative for redness and itching.  Respiratory: Positive for shortness of breath. Negative for cough, chest tightness and wheezing.   Cardiovascular: Negative for palpitations and leg swelling.  Gastrointestinal: Negative for nausea and vomiting.  Genitourinary: Negative for dysuria.  Musculoskeletal: Negative for joint swelling.  Skin: Negative for rash.  Neurological: Negative for headaches.  Hematological: Does not bruise/bleed easily.  Psychiatric/Behavioral: Negative for dysphoric mood. The patient is not nervous/anxious.        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   Right lower extremity shin and calf are wrapped and dressed      Assessment & Plan:  ILD (interstitial lung disease) ([place]) No clear indication to repeat her CT chest right now based on respiratory stability, clinical exam.  I think she needs a repeat scan in May 2020 for surveillance purposes.  Chronic respiratory failure (HCC) Continue current oxygen 2 to 3 L/min  Scleroderma (HCC) On prednisone 5 mg daily  Pulmonary hypertension associated with systemic disorder (HCC) Last echocardiogram did not estimate PASP but based on her RV function suspect that her pressures are stable.  Continue same regimen.  Baltazar Apo, MD, PhD 04/30/2018, 3:45 PM [facility] [phone] or if no answer [phone]

## 2018-04-30 NOTE — Assessment & Plan Note (Signed)
No clear indication to repeat her CT chest right now based on respiratory stability, clinical exam.  I think she needs a repeat scan in May 2020 for surveillance purposes.

## 2018-04-30 NOTE — Assessment & Plan Note (Signed)
On prednisone 5 mg daily

## 2018-04-30 NOTE — Assessment & Plan Note (Signed)
Continue current oxygen 2 to 3 L/min

## 2018-04-30 NOTE — Patient Instructions (Signed)
Please continue your prednisone as you have been taking it, follow with Dr Amil Amen Continue macitentan, sildenafil  Continue oxygen at 2-3 L/min We will plan to repeat your CT chest in 05/2019 or sooner if you have any new problems with breathing Follow echocardiogram as per Dr Clayborne Dana plans.  Follow with Dr Lamonte Sakai in 6 months or sooner if you have any problems

## 2018-05-07 DIAGNOSIS — H04123 Dry eye syndrome of bilateral lacrimal glands: Secondary | ICD-10-CM | POA: Diagnosis not present

## 2018-05-09 ENCOUNTER — Other Ambulatory Visit: Payer: Self-pay

## 2018-05-12 ENCOUNTER — Encounter (HOSPITAL_BASED_OUTPATIENT_CLINIC_OR_DEPARTMENT_OTHER): Payer: Medicare Other | Attending: Internal Medicine

## 2018-05-12 ENCOUNTER — Other Ambulatory Visit: Payer: Self-pay

## 2018-05-12 DIAGNOSIS — I7301 Raynaud's syndrome with gangrene: Secondary | ICD-10-CM | POA: Diagnosis not present

## 2018-05-12 DIAGNOSIS — L942 Calcinosis cutis: Secondary | ICD-10-CM | POA: Diagnosis not present

## 2018-05-12 DIAGNOSIS — I739 Peripheral vascular disease, unspecified: Secondary | ICD-10-CM | POA: Insufficient documentation

## 2018-05-12 DIAGNOSIS — I83222 Varicose veins of left lower extremity with both ulcer of calf and inflammation: Secondary | ICD-10-CM | POA: Diagnosis not present

## 2018-05-12 DIAGNOSIS — I87331 Chronic venous hypertension (idiopathic) with ulcer and inflammation of right lower extremity: Secondary | ICD-10-CM | POA: Diagnosis not present

## 2018-05-12 DIAGNOSIS — L97212 Non-pressure chronic ulcer of right calf with fat layer exposed: Secondary | ICD-10-CM | POA: Diagnosis not present

## 2018-05-12 DIAGNOSIS — L97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: Secondary | ICD-10-CM | POA: Diagnosis not present

## 2018-05-12 DIAGNOSIS — I10 Essential (primary) hypertension: Secondary | ICD-10-CM | POA: Insufficient documentation

## 2018-05-12 DIAGNOSIS — I87311 Chronic venous hypertension (idiopathic) with ulcer of right lower extremity: Secondary | ICD-10-CM | POA: Diagnosis not present

## 2018-05-12 DIAGNOSIS — L97312 Non-pressure chronic ulcer of right ankle with fat layer exposed: Secondary | ICD-10-CM | POA: Insufficient documentation

## 2018-05-12 DIAGNOSIS — L97812 Non-pressure chronic ulcer of other part of right lower leg with fat layer exposed: Secondary | ICD-10-CM | POA: Diagnosis not present

## 2018-05-12 DIAGNOSIS — M069 Rheumatoid arthritis, unspecified: Secondary | ICD-10-CM | POA: Insufficient documentation

## 2018-05-19 DIAGNOSIS — L942 Calcinosis cutis: Secondary | ICD-10-CM | POA: Diagnosis not present

## 2018-05-19 DIAGNOSIS — L97212 Non-pressure chronic ulcer of right calf with fat layer exposed: Secondary | ICD-10-CM | POA: Diagnosis not present

## 2018-05-19 DIAGNOSIS — I83222 Varicose veins of left lower extremity with both ulcer of calf and inflammation: Secondary | ICD-10-CM | POA: Diagnosis not present

## 2018-05-19 DIAGNOSIS — L97812 Non-pressure chronic ulcer of other part of right lower leg with fat layer exposed: Secondary | ICD-10-CM | POA: Diagnosis not present

## 2018-05-19 DIAGNOSIS — I87331 Chronic venous hypertension (idiopathic) with ulcer and inflammation of right lower extremity: Secondary | ICD-10-CM | POA: Diagnosis not present

## 2018-05-19 DIAGNOSIS — L97312 Non-pressure chronic ulcer of right ankle with fat layer exposed: Secondary | ICD-10-CM | POA: Diagnosis not present

## 2018-05-20 ENCOUNTER — Other Ambulatory Visit: Payer: Medicare Other | Admitting: Internal Medicine

## 2018-05-20 DIAGNOSIS — I10 Essential (primary) hypertension: Secondary | ICD-10-CM | POA: Diagnosis not present

## 2018-05-20 DIAGNOSIS — R7989 Other specified abnormal findings of blood chemistry: Secondary | ICD-10-CM | POA: Diagnosis not present

## 2018-05-20 DIAGNOSIS — E039 Hypothyroidism, unspecified: Secondary | ICD-10-CM | POA: Diagnosis not present

## 2018-05-20 LAB — TSH: TSH: 1.53 mIU/L (ref 0.40–4.50)

## 2018-05-27 ENCOUNTER — Other Ambulatory Visit: Payer: BLUE CROSS/BLUE SHIELD | Admitting: Internal Medicine

## 2018-05-27 DIAGNOSIS — L97812 Non-pressure chronic ulcer of other part of right lower leg with fat layer exposed: Secondary | ICD-10-CM | POA: Diagnosis not present

## 2018-05-27 DIAGNOSIS — L942 Calcinosis cutis: Secondary | ICD-10-CM | POA: Diagnosis not present

## 2018-05-27 DIAGNOSIS — I87331 Chronic venous hypertension (idiopathic) with ulcer and inflammation of right lower extremity: Secondary | ICD-10-CM | POA: Diagnosis not present

## 2018-05-27 DIAGNOSIS — L97312 Non-pressure chronic ulcer of right ankle with fat layer exposed: Secondary | ICD-10-CM | POA: Diagnosis not present

## 2018-05-27 DIAGNOSIS — I87311 Chronic venous hypertension (idiopathic) with ulcer of right lower extremity: Secondary | ICD-10-CM | POA: Diagnosis not present

## 2018-05-27 DIAGNOSIS — L97212 Non-pressure chronic ulcer of right calf with fat layer exposed: Secondary | ICD-10-CM | POA: Diagnosis not present

## 2018-05-27 DIAGNOSIS — I83222 Varicose veins of left lower extremity with both ulcer of calf and inflammation: Secondary | ICD-10-CM | POA: Diagnosis not present

## 2018-05-29 ENCOUNTER — Ambulatory Visit: Payer: BLUE CROSS/BLUE SHIELD | Admitting: Internal Medicine

## 2018-05-29 ENCOUNTER — Ambulatory Visit: Payer: Medicare Other | Admitting: Internal Medicine

## 2018-05-30 ENCOUNTER — Encounter: Payer: Self-pay | Admitting: Internal Medicine

## 2018-05-30 ENCOUNTER — Ambulatory Visit (INDEPENDENT_AMBULATORY_CARE_PROVIDER_SITE_OTHER): Payer: Medicare Other | Admitting: Internal Medicine

## 2018-05-30 VITALS — BP 120/60 | HR 66 | Temp 97.9°F | Ht 67.0 in | Wt 142.0 lb

## 2018-05-30 DIAGNOSIS — M810 Age-related osteoporosis without current pathological fracture: Secondary | ICD-10-CM | POA: Diagnosis not present

## 2018-05-30 DIAGNOSIS — M349 Systemic sclerosis, unspecified: Secondary | ICD-10-CM

## 2018-05-30 DIAGNOSIS — Z9981 Dependence on supplemental oxygen: Secondary | ICD-10-CM | POA: Diagnosis not present

## 2018-05-30 DIAGNOSIS — E039 Hypothyroidism, unspecified: Secondary | ICD-10-CM

## 2018-05-30 DIAGNOSIS — I10 Essential (primary) hypertension: Secondary | ICD-10-CM | POA: Diagnosis not present

## 2018-05-30 DIAGNOSIS — I779 Disorder of arteries and arterioles, unspecified: Secondary | ICD-10-CM | POA: Diagnosis not present

## 2018-05-30 DIAGNOSIS — I2729 Other secondary pulmonary hypertension: Secondary | ICD-10-CM | POA: Diagnosis not present

## 2018-05-30 NOTE — Progress Notes (Signed)
   Subjective:    [name], female    DOB: [birth year], 67 y.o.   MRN: [id number]  HPI patient here today to follow-up on hypothyroidism.  TSH is stable on current dose of thyroid replacement at 1.53.  She has a history of scleroderma.  She is on chronic oxygen therapy.  She looks and feels well.  She is asking about having her blood count checked.  It was last checked in October and was 11.3 g.  Seems to run between 11 and 12.9 g.  More recently has been in the 11 g range.  We can do that next week when she has fasting lipid panel drawn.  She is on Lipitor 10 mg daily.  She is on prednisone 5 mg daily for scleroderma.  She has pulmonary hypertension associated with that.  She is followed by Dr. Amil Amen.  She had bone density study done October 2018.  Results were sent to Dr. Amil Amen.  He has recommended Prolia for her.  We will need to get that approved with her insurance company.  Lowest T score was -2.6.  She is on chronic diuretic therapy    Review of Systems see above     Objective:   Physical Exam Skin warm and dry.  Nodes none.  Neck is supple.  Chest clear.  Cardiac exam regular rate and rhythm.  Extremities without edema.       Assessment & Plan:  Scleroderma  Pulmonary hypertension  Hyperlipidemia-to have fasting lipid panel next week along with CBC  Hypothyroidism-TSH is normal  Chronic oxygen therapy 2 to 3 L/min.  History of mild anemia on iron therapy-to have CBC next week  Osteoporosis- Prolia will need to be approved by insurance company  Plan: Physical exam due in 6 months.  Additional lab work next week

## 2018-05-30 NOTE — Patient Instructions (Signed)
With Prolia will need to be approved through [employer].  Have additional lab work next week as we discussed.  Physical exam due in 6 months.

## 2018-06-02 ENCOUNTER — Encounter (HOSPITAL_BASED_OUTPATIENT_CLINIC_OR_DEPARTMENT_OTHER): Payer: Medicare Other | Attending: Internal Medicine

## 2018-06-02 ENCOUNTER — Other Ambulatory Visit: Payer: Self-pay | Admitting: Internal Medicine

## 2018-06-02 DIAGNOSIS — L97312 Non-pressure chronic ulcer of right ankle with fat layer exposed: Secondary | ICD-10-CM | POA: Diagnosis not present

## 2018-06-02 DIAGNOSIS — I87331 Chronic venous hypertension (idiopathic) with ulcer and inflammation of right lower extremity: Secondary | ICD-10-CM | POA: Diagnosis not present

## 2018-06-02 DIAGNOSIS — L97812 Non-pressure chronic ulcer of other part of right lower leg with fat layer exposed: Secondary | ICD-10-CM | POA: Diagnosis not present

## 2018-06-02 DIAGNOSIS — L97215 Non-pressure chronic ulcer of right calf with muscle involvement without evidence of necrosis: Secondary | ICD-10-CM | POA: Insufficient documentation

## 2018-06-02 MED ORDER — OFLOXACIN 0.3 % OP SOLN: 1.0000 [drp] | Freq: Four times a day (QID) | OPHTHALMIC | 0 refills | Status: DC

## 2018-06-04 ENCOUNTER — Encounter (HOSPITAL_COMMUNITY): Payer: Self-pay | Admitting: *Deleted

## 2018-06-05 ENCOUNTER — Other Ambulatory Visit: Payer: Medicare Other | Admitting: Internal Medicine

## 2018-06-05 DIAGNOSIS — M349 Systemic sclerosis, unspecified: Secondary | ICD-10-CM

## 2018-06-05 DIAGNOSIS — E785 Hyperlipidemia, unspecified: Secondary | ICD-10-CM | POA: Diagnosis not present

## 2018-06-05 LAB — CBC WITH DIFFERENTIAL/PLATELET
Basophils Absolute: 19 cells/uL (ref 0–200)
Basophils Relative: 0.5 %
Eosinophils Absolute: 30 cells/uL (ref 15–500)
Eosinophils Relative: 0.8 %
HCT: 33.6 % — ABNORMAL LOW (ref 35.0–45.0)
Hemoglobin: 11.6 g/dL — ABNORMAL LOW (ref 11.7–15.5)
Lymphs Abs: 862 cells/uL (ref 850–3900)
MCH: 33.3 pg — ABNORMAL HIGH (ref 27.0–33.0)
MCHC: 34.5 g/dL (ref 32.0–36.0)
MCV: 96.6 fL (ref 80.0–100.0)
MPV: 9.6 fL (ref 7.5–12.5)
Monocytes Relative: 13.1 %
Neutro Abs: 2305 cells/uL (ref 1500–7800)
Neutrophils Relative %: 62.3 %
Platelets: 257 10*3/uL (ref 140–400)
RBC: 3.48 10*6/uL — ABNORMAL LOW (ref 3.80–5.10)
RDW: 12.3 % (ref 11.0–15.0)
Total Lymphocyte: 23.3 %
WBC mixed population: 485 cells/uL (ref 200–950)
WBC: 3.7 10*3/uL — ABNORMAL LOW (ref 3.8–10.8)

## 2018-06-05 LAB — LIPID PANEL
Cholesterol: 211 mg/dL — ABNORMAL HIGH (ref <200)
HDL: 65 mg/dL (ref >50)
LDL Cholesterol (Calc): 121 mg/dL (calc) — ABNORMAL HIGH
Non-HDL Cholesterol (Calc): 146 mg/dL (calc) — ABNORMAL HIGH (ref <130)
Total CHOL/HDL Ratio: 3.2 (calc) (ref <5.0)
Triglycerides: 134 mg/dL (ref <150)

## 2018-06-09 DIAGNOSIS — L97212 Non-pressure chronic ulcer of right calf with fat layer exposed: Secondary | ICD-10-CM | POA: Diagnosis not present

## 2018-06-09 DIAGNOSIS — L97312 Non-pressure chronic ulcer of right ankle with fat layer exposed: Secondary | ICD-10-CM | POA: Diagnosis not present

## 2018-06-09 DIAGNOSIS — L97215 Non-pressure chronic ulcer of right calf with muscle involvement without evidence of necrosis: Secondary | ICD-10-CM | POA: Diagnosis not present

## 2018-06-09 DIAGNOSIS — I87311 Chronic venous hypertension (idiopathic) with ulcer of right lower extremity: Secondary | ICD-10-CM | POA: Diagnosis not present

## 2018-06-09 DIAGNOSIS — L97812 Non-pressure chronic ulcer of other part of right lower leg with fat layer exposed: Secondary | ICD-10-CM | POA: Diagnosis not present

## 2018-06-09 DIAGNOSIS — I87331 Chronic venous hypertension (idiopathic) with ulcer and inflammation of right lower extremity: Secondary | ICD-10-CM | POA: Diagnosis not present

## 2018-06-11 DIAGNOSIS — H04123 Dry eye syndrome of bilateral lacrimal glands: Secondary | ICD-10-CM | POA: Diagnosis not present

## 2018-06-12 ENCOUNTER — Ambulatory Visit (INDEPENDENT_AMBULATORY_CARE_PROVIDER_SITE_OTHER): Payer: Medicare Other | Admitting: Internal Medicine

## 2018-06-12 VITALS — BP 120/70 | HR 68 | Temp 98.1°F

## 2018-06-12 DIAGNOSIS — I779 Disorder of arteries and arterioles, unspecified: Secondary | ICD-10-CM

## 2018-06-12 DIAGNOSIS — M810 Age-related osteoporosis without current pathological fracture: Secondary | ICD-10-CM | POA: Diagnosis not present

## 2018-06-12 MED ORDER — DENOSUMAB 60 MG/ML ~~LOC~~ SOSY
60.0000 mg | PREFILLED_SYRINGE | Freq: Once | SUBCUTANEOUS | Status: AC
  Administered 2018-06-12: 60 mg via SUBCUTANEOUS

## 2018-06-12 NOTE — Patient Instructions (Signed)
Patient received a prolia injection SQ on the left arm.

## 2018-06-12 NOTE — Progress Notes (Signed)
   Subjective:    [name], female    DOB: Mar 13, 1951, 67 y.o.   MRN: [id number]  HPI Prolia injection given by CMA. Next dose due in 6 months    Review of Systems     Objective:   Physical Exam        Assessment & Plan:

## 2018-06-16 DIAGNOSIS — I87331 Chronic venous hypertension (idiopathic) with ulcer and inflammation of right lower extremity: Secondary | ICD-10-CM | POA: Diagnosis not present

## 2018-06-16 DIAGNOSIS — I87311 Chronic venous hypertension (idiopathic) with ulcer of right lower extremity: Secondary | ICD-10-CM | POA: Diagnosis not present

## 2018-06-16 DIAGNOSIS — L97812 Non-pressure chronic ulcer of other part of right lower leg with fat layer exposed: Secondary | ICD-10-CM | POA: Diagnosis not present

## 2018-06-16 DIAGNOSIS — L97212 Non-pressure chronic ulcer of right calf with fat layer exposed: Secondary | ICD-10-CM | POA: Diagnosis not present

## 2018-06-16 DIAGNOSIS — L97312 Non-pressure chronic ulcer of right ankle with fat layer exposed: Secondary | ICD-10-CM | POA: Diagnosis not present

## 2018-06-16 DIAGNOSIS — L97215 Non-pressure chronic ulcer of right calf with muscle involvement without evidence of necrosis: Secondary | ICD-10-CM | POA: Diagnosis not present

## 2018-06-16 DIAGNOSIS — E039 Hypothyroidism, unspecified: Secondary | ICD-10-CM | POA: Insufficient documentation

## 2018-06-20 ENCOUNTER — Other Ambulatory Visit (HOSPITAL_COMMUNITY): Payer: Self-pay | Admitting: *Deleted

## 2018-06-20 MED ORDER — TORSEMIDE 20 MG PO TABS: 40.0000 mg | ORAL_TABLET | Freq: Two times a day (BID) | ORAL | 3 refills | Status: DC

## 2018-06-23 ENCOUNTER — Telehealth: Payer: Self-pay | Admitting: Internal Medicine

## 2018-06-23 DIAGNOSIS — L97212 Non-pressure chronic ulcer of right calf with fat layer exposed: Secondary | ICD-10-CM | POA: Diagnosis not present

## 2018-06-23 DIAGNOSIS — L97215 Non-pressure chronic ulcer of right calf with muscle involvement without evidence of necrosis: Secondary | ICD-10-CM | POA: Diagnosis not present

## 2018-06-23 DIAGNOSIS — L97812 Non-pressure chronic ulcer of other part of right lower leg with fat layer exposed: Secondary | ICD-10-CM | POA: Diagnosis not present

## 2018-06-23 DIAGNOSIS — L97312 Non-pressure chronic ulcer of right ankle with fat layer exposed: Secondary | ICD-10-CM | POA: Diagnosis not present

## 2018-06-23 DIAGNOSIS — I87311 Chronic venous hypertension (idiopathic) with ulcer of right lower extremity: Secondary | ICD-10-CM | POA: Diagnosis not present

## 2018-06-23 DIAGNOSIS — I87331 Chronic venous hypertension (idiopathic) with ulcer and inflammation of right lower extremity: Secondary | ICD-10-CM | POA: Diagnosis not present

## 2018-06-23 NOTE — Telephone Encounter (Signed)
She says her thyroid replacement is scored so she can cut it in half however, I don't think this is causing diarrhea although she read this on line. She has not traveled recently. No blood in BM but episodes of diarrhea are frequent and not related to any particular food. Told her we can do stool studies if this continues. Consider irritable bowel syndrome. May need to see GI.

## 2018-06-23 NOTE — Telephone Encounter (Signed)
Patient wants to know if she can decrease the strength of her Levothyroxine?  She is having a lot of diarrhea with her thyroid medication.  States that this is the only change in medication that she has had in the past 3 months.  She hasn't had diarrhea for the entire 3 months, however, she feels that thyroid medication is the culprit.  She IS taking the medication correctly.  She is taking it on an empty stomach 30 minutes prior to meals.    She is also taking Ferrous Sulfate as an iron supplement.  She takes this bid; once in the morning and once in the evening.  She doesn't take this near the time of her thyroid medication in the morning.  She doesn't know if that is interacting at all with the thyroid medication.  She is wondering about that though??    Any ideas??  She is just tired of the diarrhea because she has had to take Immodium lately in order to get to some of her other doctor's appointments because of the diarrhea.  So, she just wonders about cutting the dosage if that would make a difference at all??    Phone #:  [phone]  Thank you.

## 2018-06-25 ENCOUNTER — Other Ambulatory Visit: Payer: Self-pay | Admitting: Internal Medicine

## 2018-06-25 ENCOUNTER — Ambulatory Visit (HOSPITAL_COMMUNITY)
Admission: RE | Admit: 2018-06-25 | Discharge: 2018-06-25 | Disposition: A | Discharge status: Home / Self Care | Payer: Medicare Other | Source: Ambulatory Visit | Attending: Internal Medicine | Admitting: Internal Medicine

## 2018-06-25 VITALS — BP 116/68 | HR 76 | Wt 142.1 lb

## 2018-06-25 DIAGNOSIS — J849 Interstitial pulmonary disease, unspecified: Secondary | ICD-10-CM | POA: Insufficient documentation

## 2018-06-25 DIAGNOSIS — Z9981 Dependence on supplemental oxygen: Secondary | ICD-10-CM | POA: Diagnosis not present

## 2018-06-25 DIAGNOSIS — Z85820 Personal history of malignant melanoma of skin: Secondary | ICD-10-CM | POA: Diagnosis not present

## 2018-06-25 DIAGNOSIS — Z881 Allergy status to other antibiotic agents status: Secondary | ICD-10-CM | POA: Diagnosis not present

## 2018-06-25 DIAGNOSIS — Z880 Allergy status to penicillin: Secondary | ICD-10-CM | POA: Insufficient documentation

## 2018-06-25 DIAGNOSIS — Z8572 Personal history of non-Hodgkin lymphomas: Secondary | ICD-10-CM | POA: Insufficient documentation

## 2018-06-25 DIAGNOSIS — M349 Systemic sclerosis, unspecified: Secondary | ICD-10-CM | POA: Diagnosis not present

## 2018-06-25 DIAGNOSIS — Z79899 Other long term (current) drug therapy: Secondary | ICD-10-CM | POA: Diagnosis not present

## 2018-06-25 DIAGNOSIS — I739 Peripheral vascular disease, unspecified: Secondary | ICD-10-CM | POA: Diagnosis not present

## 2018-06-25 DIAGNOSIS — I272 Pulmonary hypertension, unspecified: Secondary | ICD-10-CM

## 2018-06-25 DIAGNOSIS — Z9221 Personal history of antineoplastic chemotherapy: Secondary | ICD-10-CM | POA: Diagnosis not present

## 2018-06-25 DIAGNOSIS — Z7902 Long term (current) use of antithrombotics/antiplatelets: Secondary | ICD-10-CM | POA: Diagnosis not present

## 2018-06-25 DIAGNOSIS — Z882 Allergy status to sulfonamides status: Secondary | ICD-10-CM | POA: Diagnosis not present

## 2018-06-25 DIAGNOSIS — I2721 Secondary pulmonary arterial hypertension: Secondary | ICD-10-CM | POA: Diagnosis not present

## 2018-06-25 DIAGNOSIS — I10 Essential (primary) hypertension: Secondary | ICD-10-CM | POA: Insufficient documentation

## 2018-06-25 DIAGNOSIS — Z89021 Acquired absence of right finger(s): Secondary | ICD-10-CM | POA: Diagnosis not present

## 2018-06-25 DIAGNOSIS — R197 Diarrhea, unspecified: Secondary | ICD-10-CM

## 2018-06-25 MED ORDER — SILDENAFIL CITRATE 20 MG PO TABS: 60.0000 mg | ORAL_TABLET | Freq: Three times a day (TID) | ORAL | 3 refills | Status: DC

## 2018-06-25 MED ORDER — TORSEMIDE 20 MG PO TABS: 40.0000 mg | ORAL_TABLET | Freq: Two times a day (BID) | ORAL | 3 refills | Status: DC

## 2018-06-25 NOTE — Progress Notes (Signed)
PULMONARY HYPERTENSION CLINIC  [name], female   DOB: 08/20/1951, 67 y.o.   MRN: [id number] Primary Cardiologist: Dr. Haroldine [name] Pulmonologist: Dr. Lamonte [name]  HPI: [name] [name] is a 67 y.o. 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   PAH Meds: 1. started on IV epoprostenol (Veletri) in 01/2013. She had intolerable side effects with Veletri.  Admitted to [facility] 12/17/13 through 12/19 for Veletri wean and Tyvaso initiation. 2.  Stopped Tyvaso in 2015. 3. February 2016 switched bosentan to Macitentan 4. Now on Macitentan 10 and sildenafil 40 tid (did not tolerate 60 tid or Adcirca).  5. Selexipag added 5/18 - stopped due to SEs  Here for f/u: Had to stop selexipag due to side effects and flu-like symptoms. Remains on Macitentan 10 and sildenafil 40 tid. Doing well from [facility] standpoint. Breathing ok. No dizziness.  Still with some edema. But relatively well controlled on torsemide 40 bid. Struggling with recurrent wound on leg.  Still with severe neuropathy. Continues with wearing O2. Denies edema. Takes torsemide 40 bid. 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 Continues to follow with Dr. Dellia Nims for wounds on LLE. Getting cadaver skin grafts.   Echo 12/18 EF 60-65% RV dilated with mildly to moderately decreased function Personally reviewed   Studies   6 MW 04/18/12 880 feet 6 MW 11/10/12 650 feet. Limited by legs. Sats down to 85% on 5L (sats at rest 95%)  6 MW 3/15 915 feet (279 m) 6 MW 2/16 930 feet (283 m)  Echo 10/13 LV EF 65-70%. RV severely enlarged/HK severe TR. RVSP 43mHg Echo (10/15) with EF 60-65%, D-shaped interventricular septum, moderately dilated RV with moderately decreased systolic function, PA systolic pressure 72 mmHg, IVC not dilated.  Echo (5/17) with EF 65-70%, Mildly flattened septum,  severely dilated RV with moderately decreased systolic function, PA systolic pressure 68 mmHg, IVC not dilated.   RHC 04/19/17  RA = 4 RV = 80/8 PA = 79/17 (43) PCW = 6 Fick cardiac output/index = 4.1/2.4 PVR = 9.0 WU Ao sat = 94% PA sat = 59%, 62%  Assessment:  1. Moderate PAH   RHC 04/18/12  RA = 9  RV = 75/11/14  PA = 71/25 (44)  PCW = 4  Fick cardiac output/index = 3.0/1.7  Thermo CO/CI = 2.7/1.5  PVR = 13.3 Woods  FA sat = 79% on room air (checked 3 times). Sat with finger probe on air 94%  PA sat = 41%, 43%   RHC 11/14/12  RA = 14  RV = 84/8/20  PA = 83/29 (50)  PCW = 10  Fick cardiac output/index = 4.5/2.5  Thermo CO/CI = 3.8/2.2  PVR = 8.9 Woods (Fick)  FA sat = 86% on 3L  PA sat = 55%, 57  ABI 4/15: R 0.86 L 1.0   Labs (3/15): K 3.9, creatinine 1.0, LFTs normal Labs (05/17/14): K 3.4 Cr 1.1 Bun 40 LFTs ok  Labs (6/15): K 4.3, creatinine 1.15 Labs (8/15): creatinine 1.4 Labs (10/15): LFTs normal Labs (2/16): LFTs normal  Review of Systems: All pertinent positives and negatives as in HPI, otherwise negative.   Past Medical History  Diagnosis Date  . History of chicken pox   . Hypertension   . [place]   . Non Hodgkin's lymphoma ([place]     Treated with  chemo 2010, felt to be cured.  . Melanoma ([place]) 1999  . Peripheral arterial occlusive disease ([place])   . 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[place])   . DOE (dyspnea on exertion)   . PAH (pulmonary artery hypertension) ([place])   . Epistaxis   . Pulmonary hypertension ([place])     Prior to Admission medications   Medication Sig Start Date End Date Taking? Authorizing Provider  acetaminophen (TYLENOL ARTHRITIS PAIN) 650 MG CR tablet Take 1,300 mg by mouth every 8 (eight) hours.    Yes [provider]  atorvastatin (LIPITOR) 10 MG tablet Take 1 tablet (10 mg total) by mouth daily. 06/26/17  Yes Oliver Heitzenrater, Shaune Pascal, MD  Biotin 5000 MCG TABS Take 5,000 mcg by mouth daily.   Yes [provider]  CALCIUM-MAGNESIUM-VITAMIN D PO Take 2 tablets by mouth daily.   Yes [provider]  Cholecalciferol (VITAMIN D) 2000 units CAPS Take 2,000 Units by mouth daily.   Yes [provider]  citalopram (CELEXA) 20 MG tablet Take 1 tablet (20 mg total) by mouth at bedtime. 09/10/17  Yes Hoyt Koch, MD  clopidogrel (PLAVIX) 75 MG tablet Take 1 tablet (75 mg total) by mouth daily. 06/24/17  Yes Cydnee Fuquay, Shaune Pascal, MD  fish oil-omega-3 fatty acids 1000 MG capsule Take 1,000 mg by mouth 2 (two) times daily.    Yes [provider]  gabapentin (NEURONTIN) 300 MG capsule take 1 capsules by mouth every morning and 2 every evening Patient taking differently: Take 600 mg in the morning and 300 mg in the evening 03/07/17  Yes Hoyt Koch, MD  iron polysaccharides (POLY-IRON 150) 150 MG capsule Take 1 capsule (150 mg total) by mouth 2 (two) times daily. 10/31/17  Yes Baxley, Cresenciano Lick, MD  levothyroxine (SYNTHROID, LEVOTHROID) 50 MCG tablet Take 50 mcg by mouth daily before breakfast.   Yes [provider]  Macitentan (OPSUMIT) 10 MG TABS Take 1 tablet (10 mg total) by mouth daily. 03/26/17  Yes Ventura Leggitt, Shaune Pascal, MD  Multiple Vitamin (MULTIVITAMIN WITH MINERALS) TABS tablet Take 1 tablet by mouth daily.   Yes [provider]  oxyCODONE-acetaminophen (ROXICET) 5-325 MG tablet Take 1 tablet by mouth every 4 (four) hours as needed. 10/24/17  Yes Daryll Brod, MD  OXYGEN Inhale 2-3 L into the lungs continuous.    Yes [provider]  pantoprazole (PROTONIX) 40 MG tablet Take 1 tablet (40 mg total) by mouth 2 (two) times daily. 05/08/16  Yes Robbie Lis, MD  potassium chloride (K-DUR) 10 MEQ tablet Take 1 tablet (10 mEq total) by mouth daily. 06/24/17  Yes Barbi Kumagai, Shaune Pascal, MD  predniSONE (DELTASONE) 5 MG tablet Take 5 mg by mouth daily with breakfast.   Yes [provider]  Probiotic CAPS Take 1 capsule by mouth daily.   Yes  [provider]  sildenafil (REVATIO) 20 MG tablet TAKE 2 TABLETS BY MOUTH THREE TIMES DAILY 09/23/17  Yes Fabian Coca, Shaune Pascal, MD  spironolactone (ALDACTONE) 25 MG tablet Take 1 tablet (25 mg total) by mouth daily. 06/26/17  Yes Gorman Safi, Shaune Pascal, MD  torsemide (DEMADEX) 20 MG tablet Take 2 tablets (40 mg total) by mouth 2 (two) times daily. 06/26/17  Yes Jace Dowe, Shaune Pascal, MD  vitamin B-12 (CYANOCOBALAMIN) 1000 MCG tablet Take 1,000 mcg by mouth daily.   Yes [provider]     Allergies  Allergen Reactions  . Sulfa Antibiotics     Severe rash with significant peeling of face.  No airway involvement per patient  . Levaquin [Levofloxacin In D5w] Other (See Comments)    Other reaction(s): Other (See Comments) FLU LIKE SYMPTOM  . Penicillins Rash     Filed Vitals:   05/01/16 1357  BP: 138/70  Pulse: 75  Weight: 135 lb (61.236 kg)    PHYSICAL EXAM: General: Walked into clinic with O2. NAD HEENT: normal anicteric Neck: supple. JVP 8-9  . Carotids 2+ bilat; no bruits. No lymphadenopathy or thryomegaly appreciated. Cor: PMI nondisplaced.  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 R index finger amputated. Has regression of R 3rd finger as well   BLE wrapped 2+ edema on L Neuro: alert & oriented x 3, cranial nerves grossly intact. moves all 4 extremities w/o difficulty. Affect pleasant    ASSESSMENT & PLAN:  1. 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  Unable to tolerate IV epoprostenol and with no improvement on Tyvaso.  She stopped Tyvaso and 6 minute walkshowed no deterioration.  She remains on Revatio and macitentan.  WHO class II-III dyspnea.  Last echo showed moderately dilated and moderately dysfunctional RV with persistent severe pulmonary hypertension.   -  RHC 4/18 as above -has now failed Selexipag as well  as uptitration of her other PAH meds. Only other option would be to try to switch sildenafil to Adempas but will defer for now given intolerances. Will increase sildeanfil to 600 tid - Echo 12/18. Mild RV strain. Unable to estimate RVSP due to lack of significant TR  - Stable NYHA III. She has been remarkably resilient in the face of her extensive severe medical issues - Volume status ok today. Have refilled torsemide for her - Continue home oxygen.  - Unable to do 6MW 2. Scleroderma:  - Advanced. Followed by Dr. Amil Amen. 3. Chronic L Ulceration: Followed by Wound Care.   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 PAD with LE wounds: - Follows with [facility]. Continue Plavix - Seeing Dr. Dellia Nims at wound clinic. Continue skin grafting  5. Interstitial lung disease:  - In setting of scleroderma. Stable. O2 sats ok.  She is on home oxygen and followed by Dr. Ocie Doyne, MD  12:49 PM

## 2018-06-25 NOTE — Patient Instructions (Signed)
Increase Sildenafil to 60 mg (3 tabs) Three times a day   We will contact you in 6 months to schedule your next appointment.

## 2018-06-27 DIAGNOSIS — R197 Diarrhea, unspecified: Secondary | ICD-10-CM | POA: Diagnosis not present

## 2018-06-27 DIAGNOSIS — M349 Systemic sclerosis, unspecified: Secondary | ICD-10-CM | POA: Diagnosis not present

## 2018-06-27 LAB — TIQ-NTM

## 2018-06-27 NOTE — Addendum Note (Signed)
Addended by: Mady Haagensen on: 06/27/2018 01:28 PM   Modules accepted: Orders

## 2018-07-01 ENCOUNTER — Other Ambulatory Visit: Payer: Self-pay | Admitting: Internal Medicine

## 2018-07-01 LAB — FECAL LEUKOCYTES
Micro Number: 90781010
Result: NOT DETECTED
Specimen Quality: ADEQUATE

## 2018-07-01 LAB — TEST AUTHORIZATION

## 2018-07-01 LAB — OVA AND PARASITE EXAMINATION
CONCENTRATE RESULT:: NONE SEEN
MICRO NUMBER:: 90781161
SPECIMEN QUALITY:: ADEQUATE
TRICHROME RESULT:: NONE SEEN

## 2018-07-01 LAB — TEST AUTHORIZATION 2

## 2018-07-01 LAB — GASTROINTESTINAL PATHOGEN PANEL PCR
C. difficile Tox A/B, PCR: NOT DETECTED
Campylobacter, PCR: NOT DETECTED
Cryptosporidium, PCR: NOT DETECTED
E coli (ETEC) LT/ST PCR: NOT DETECTED
E coli (STEC) stx1/stx2, PCR: NOT DETECTED
E coli 0157, PCR: NOT DETECTED
Giardia lamblia, PCR: NOT DETECTED
Norovirus, PCR: NOT DETECTED
Rotavirus A, PCR: NOT DETECTED
Salmonella, PCR: NOT DETECTED
Shigella, PCR: NOT DETECTED

## 2018-07-01 LAB — FECAL LACTOFERRIN, QUANT
Fecal Lactoferrin: POSITIVE — AB
MICRO NUMBER:: 90774762
SPECIMEN QUALITY:: ADEQUATE

## 2018-07-07 ENCOUNTER — Encounter (HOSPITAL_BASED_OUTPATIENT_CLINIC_OR_DEPARTMENT_OTHER): Payer: Medicare Other | Attending: Internal Medicine

## 2018-07-07 DIAGNOSIS — L97312 Non-pressure chronic ulcer of right ankle with fat layer exposed: Secondary | ICD-10-CM | POA: Diagnosis not present

## 2018-07-07 DIAGNOSIS — I7300 Raynaud's syndrome without gangrene: Secondary | ICD-10-CM | POA: Diagnosis not present

## 2018-07-07 DIAGNOSIS — L97812 Non-pressure chronic ulcer of other part of right lower leg with fat layer exposed: Secondary | ICD-10-CM | POA: Insufficient documentation

## 2018-07-07 DIAGNOSIS — M349 Systemic sclerosis, unspecified: Secondary | ICD-10-CM | POA: Insufficient documentation

## 2018-07-07 DIAGNOSIS — M069 Rheumatoid arthritis, unspecified: Secondary | ICD-10-CM | POA: Diagnosis not present

## 2018-07-07 DIAGNOSIS — I87331 Chronic venous hypertension (idiopathic) with ulcer and inflammation of right lower extremity: Secondary | ICD-10-CM | POA: Insufficient documentation

## 2018-07-07 DIAGNOSIS — I87311 Chronic venous hypertension (idiopathic) with ulcer of right lower extremity: Secondary | ICD-10-CM | POA: Diagnosis not present

## 2018-07-07 DIAGNOSIS — I10 Essential (primary) hypertension: Secondary | ICD-10-CM | POA: Diagnosis not present

## 2018-07-07 DIAGNOSIS — I272 Pulmonary hypertension, unspecified: Secondary | ICD-10-CM | POA: Insufficient documentation

## 2018-07-07 DIAGNOSIS — I739 Peripheral vascular disease, unspecified: Secondary | ICD-10-CM | POA: Insufficient documentation

## 2018-07-07 DIAGNOSIS — L97212 Non-pressure chronic ulcer of right calf with fat layer exposed: Secondary | ICD-10-CM | POA: Diagnosis not present

## 2018-07-09 DIAGNOSIS — Z9981 Dependence on supplemental oxygen: Secondary | ICD-10-CM | POA: Diagnosis not present

## 2018-07-09 DIAGNOSIS — D509 Iron deficiency anemia, unspecified: Secondary | ICD-10-CM | POA: Diagnosis not present

## 2018-07-09 DIAGNOSIS — M349 Systemic sclerosis, unspecified: Secondary | ICD-10-CM | POA: Diagnosis not present

## 2018-07-09 DIAGNOSIS — R197 Diarrhea, unspecified: Secondary | ICD-10-CM | POA: Diagnosis not present

## 2018-07-09 DIAGNOSIS — I272 Pulmonary hypertension, unspecified: Secondary | ICD-10-CM | POA: Diagnosis not present

## 2018-07-09 DIAGNOSIS — K210 Gastro-esophageal reflux disease with esophagitis: Secondary | ICD-10-CM | POA: Diagnosis not present

## 2018-07-14 DIAGNOSIS — I87311 Chronic venous hypertension (idiopathic) with ulcer of right lower extremity: Secondary | ICD-10-CM | POA: Diagnosis not present

## 2018-07-14 DIAGNOSIS — L97312 Non-pressure chronic ulcer of right ankle with fat layer exposed: Secondary | ICD-10-CM | POA: Diagnosis not present

## 2018-07-14 DIAGNOSIS — I87331 Chronic venous hypertension (idiopathic) with ulcer and inflammation of right lower extremity: Secondary | ICD-10-CM | POA: Diagnosis not present

## 2018-07-14 DIAGNOSIS — L97812 Non-pressure chronic ulcer of other part of right lower leg with fat layer exposed: Secondary | ICD-10-CM | POA: Diagnosis not present

## 2018-07-14 DIAGNOSIS — L97212 Non-pressure chronic ulcer of right calf with fat layer exposed: Secondary | ICD-10-CM | POA: Diagnosis not present

## 2018-07-14 DIAGNOSIS — M349 Systemic sclerosis, unspecified: Secondary | ICD-10-CM | POA: Diagnosis not present

## 2018-07-14 DIAGNOSIS — I739 Peripheral vascular disease, unspecified: Secondary | ICD-10-CM | POA: Diagnosis not present

## 2018-07-14 DIAGNOSIS — I10 Essential (primary) hypertension: Secondary | ICD-10-CM | POA: Diagnosis not present

## 2018-07-17 DIAGNOSIS — M79672 Pain in left foot: Secondary | ICD-10-CM | POA: Diagnosis not present

## 2018-07-17 DIAGNOSIS — M340 Progressive systemic sclerosis: Secondary | ICD-10-CM | POA: Diagnosis not present

## 2018-07-17 DIAGNOSIS — Z6821 Body mass index (BMI) 21.0-21.9, adult: Secondary | ICD-10-CM | POA: Diagnosis not present

## 2018-07-17 DIAGNOSIS — S81801D Unspecified open wound, right lower leg, subsequent encounter: Secondary | ICD-10-CM | POA: Diagnosis not present

## 2018-07-17 DIAGNOSIS — I7301 Raynaud's syndrome with gangrene: Secondary | ICD-10-CM | POA: Diagnosis not present

## 2018-07-17 DIAGNOSIS — M810 Age-related osteoporosis without current pathological fracture: Secondary | ICD-10-CM | POA: Diagnosis not present

## 2018-07-17 DIAGNOSIS — D638 Anemia in other chronic diseases classified elsewhere: Secondary | ICD-10-CM | POA: Diagnosis not present

## 2018-07-17 DIAGNOSIS — G629 Polyneuropathy, unspecified: Secondary | ICD-10-CM | POA: Diagnosis not present

## 2018-07-17 DIAGNOSIS — K219 Gastro-esophageal reflux disease without esophagitis: Secondary | ICD-10-CM | POA: Diagnosis not present

## 2018-07-28 DIAGNOSIS — L97812 Non-pressure chronic ulcer of other part of right lower leg with fat layer exposed: Secondary | ICD-10-CM | POA: Diagnosis not present

## 2018-07-28 DIAGNOSIS — I739 Peripheral vascular disease, unspecified: Secondary | ICD-10-CM | POA: Diagnosis not present

## 2018-07-28 DIAGNOSIS — M349 Systemic sclerosis, unspecified: Secondary | ICD-10-CM | POA: Diagnosis not present

## 2018-07-28 DIAGNOSIS — L97212 Non-pressure chronic ulcer of right calf with fat layer exposed: Secondary | ICD-10-CM | POA: Diagnosis not present

## 2018-07-28 DIAGNOSIS — I10 Essential (primary) hypertension: Secondary | ICD-10-CM | POA: Diagnosis not present

## 2018-07-28 DIAGNOSIS — I87331 Chronic venous hypertension (idiopathic) with ulcer and inflammation of right lower extremity: Secondary | ICD-10-CM | POA: Diagnosis not present

## 2018-07-28 DIAGNOSIS — I87311 Chronic venous hypertension (idiopathic) with ulcer of right lower extremity: Secondary | ICD-10-CM | POA: Diagnosis not present

## 2018-07-28 DIAGNOSIS — L97312 Non-pressure chronic ulcer of right ankle with fat layer exposed: Secondary | ICD-10-CM | POA: Diagnosis not present

## 2018-08-01 DIAGNOSIS — I96 Gangrene, not elsewhere classified: Secondary | ICD-10-CM | POA: Diagnosis not present

## 2018-08-01 DIAGNOSIS — M349 Systemic sclerosis, unspecified: Secondary | ICD-10-CM | POA: Diagnosis not present

## 2018-08-08 DIAGNOSIS — I96 Gangrene, not elsewhere classified: Secondary | ICD-10-CM | POA: Diagnosis not present

## 2018-08-08 DIAGNOSIS — M349 Systemic sclerosis, unspecified: Secondary | ICD-10-CM | POA: Diagnosis not present

## 2018-08-11 ENCOUNTER — Encounter (HOSPITAL_BASED_OUTPATIENT_CLINIC_OR_DEPARTMENT_OTHER): Payer: Medicare Other | Attending: Internal Medicine

## 2018-08-11 DIAGNOSIS — L97811 Non-pressure chronic ulcer of other part of right lower leg limited to breakdown of skin: Secondary | ICD-10-CM | POA: Insufficient documentation

## 2018-08-11 DIAGNOSIS — L97312 Non-pressure chronic ulcer of right ankle with fat layer exposed: Secondary | ICD-10-CM | POA: Insufficient documentation

## 2018-08-11 DIAGNOSIS — Z9981 Dependence on supplemental oxygen: Secondary | ICD-10-CM | POA: Insufficient documentation

## 2018-08-11 DIAGNOSIS — I10 Essential (primary) hypertension: Secondary | ICD-10-CM | POA: Insufficient documentation

## 2018-08-11 DIAGNOSIS — M069 Rheumatoid arthritis, unspecified: Secondary | ICD-10-CM | POA: Insufficient documentation

## 2018-08-11 DIAGNOSIS — I87331 Chronic venous hypertension (idiopathic) with ulcer and inflammation of right lower extremity: Secondary | ICD-10-CM | POA: Insufficient documentation

## 2018-08-11 DIAGNOSIS — I272 Pulmonary hypertension, unspecified: Secondary | ICD-10-CM | POA: Diagnosis not present

## 2018-08-11 DIAGNOSIS — I7300 Raynaud's syndrome without gangrene: Secondary | ICD-10-CM | POA: Insufficient documentation

## 2018-08-11 DIAGNOSIS — I87311 Chronic venous hypertension (idiopathic) with ulcer of right lower extremity: Secondary | ICD-10-CM | POA: Diagnosis not present

## 2018-08-11 DIAGNOSIS — L97812 Non-pressure chronic ulcer of other part of right lower leg with fat layer exposed: Secondary | ICD-10-CM | POA: Insufficient documentation

## 2018-08-11 DIAGNOSIS — Z89021 Acquired absence of right finger(s): Secondary | ICD-10-CM | POA: Insufficient documentation

## 2018-08-11 DIAGNOSIS — I739 Peripheral vascular disease, unspecified: Secondary | ICD-10-CM | POA: Diagnosis not present

## 2018-08-11 DIAGNOSIS — Z87891 Personal history of nicotine dependence: Secondary | ICD-10-CM | POA: Insufficient documentation

## 2018-08-11 DIAGNOSIS — M349 Systemic sclerosis, unspecified: Secondary | ICD-10-CM | POA: Diagnosis not present

## 2018-08-12 DIAGNOSIS — K210 Gastro-esophageal reflux disease with esophagitis: Secondary | ICD-10-CM | POA: Diagnosis not present

## 2018-08-12 DIAGNOSIS — M349 Systemic sclerosis, unspecified: Secondary | ICD-10-CM | POA: Diagnosis not present

## 2018-08-12 DIAGNOSIS — I739 Peripheral vascular disease, unspecified: Secondary | ICD-10-CM | POA: Diagnosis not present

## 2018-08-12 DIAGNOSIS — Z9981 Dependence on supplemental oxygen: Secondary | ICD-10-CM | POA: Diagnosis not present

## 2018-08-12 DIAGNOSIS — D509 Iron deficiency anemia, unspecified: Secondary | ICD-10-CM | POA: Diagnosis not present

## 2018-08-12 DIAGNOSIS — I270 Primary pulmonary hypertension: Secondary | ICD-10-CM | POA: Diagnosis not present

## 2018-08-12 DIAGNOSIS — R197 Diarrhea, unspecified: Secondary | ICD-10-CM | POA: Diagnosis not present

## 2018-08-19 DIAGNOSIS — L943 Sclerodactyly: Secondary | ICD-10-CM | POA: Diagnosis not present

## 2018-08-19 DIAGNOSIS — C44622 Squamous cell carcinoma of skin of right upper limb, including shoulder: Secondary | ICD-10-CM | POA: Diagnosis not present

## 2018-08-19 DIAGNOSIS — L570 Actinic keratosis: Secondary | ICD-10-CM | POA: Diagnosis not present

## 2018-08-25 ENCOUNTER — Other Ambulatory Visit (HOSPITAL_COMMUNITY): Payer: Self-pay | Admitting: Internal Medicine

## 2018-08-25 DIAGNOSIS — L97811 Non-pressure chronic ulcer of other part of right lower leg limited to breakdown of skin: Secondary | ICD-10-CM | POA: Diagnosis not present

## 2018-08-25 DIAGNOSIS — I87331 Chronic venous hypertension (idiopathic) with ulcer and inflammation of right lower extremity: Secondary | ICD-10-CM | POA: Diagnosis not present

## 2018-08-25 DIAGNOSIS — L97312 Non-pressure chronic ulcer of right ankle with fat layer exposed: Secondary | ICD-10-CM | POA: Diagnosis not present

## 2018-08-25 DIAGNOSIS — I272 Pulmonary hypertension, unspecified: Secondary | ICD-10-CM | POA: Diagnosis not present

## 2018-08-25 DIAGNOSIS — I87311 Chronic venous hypertension (idiopathic) with ulcer of right lower extremity: Secondary | ICD-10-CM | POA: Diagnosis not present

## 2018-08-25 DIAGNOSIS — L97812 Non-pressure chronic ulcer of other part of right lower leg with fat layer exposed: Secondary | ICD-10-CM | POA: Diagnosis not present

## 2018-08-25 DIAGNOSIS — I10 Essential (primary) hypertension: Secondary | ICD-10-CM | POA: Diagnosis not present

## 2018-08-29 DIAGNOSIS — I96 Gangrene, not elsewhere classified: Secondary | ICD-10-CM | POA: Diagnosis not present

## 2018-08-29 DIAGNOSIS — M349 Systemic sclerosis, unspecified: Secondary | ICD-10-CM | POA: Diagnosis not present

## 2018-09-02 DIAGNOSIS — C44719 Basal cell carcinoma of skin of left lower limb, including hip: Secondary | ICD-10-CM | POA: Diagnosis not present

## 2018-09-03 ENCOUNTER — Encounter: Payer: Self-pay | Admitting: Internal Medicine

## 2018-09-03 DIAGNOSIS — C44719 Basal cell carcinoma of skin of left lower limb, including hip: Secondary | ICD-10-CM | POA: Diagnosis not present

## 2018-09-08 ENCOUNTER — Encounter (HOSPITAL_BASED_OUTPATIENT_CLINIC_OR_DEPARTMENT_OTHER): Payer: Medicare Other | Attending: Internal Medicine

## 2018-09-08 ENCOUNTER — Encounter (HOSPITAL_BASED_OUTPATIENT_CLINIC_OR_DEPARTMENT_OTHER): Payer: Self-pay

## 2018-09-08 DIAGNOSIS — L97212 Non-pressure chronic ulcer of right calf with fat layer exposed: Secondary | ICD-10-CM | POA: Diagnosis not present

## 2018-09-08 DIAGNOSIS — I10 Essential (primary) hypertension: Secondary | ICD-10-CM | POA: Diagnosis not present

## 2018-09-08 DIAGNOSIS — I87331 Chronic venous hypertension (idiopathic) with ulcer and inflammation of right lower extremity: Secondary | ICD-10-CM | POA: Insufficient documentation

## 2018-09-08 DIAGNOSIS — L97812 Non-pressure chronic ulcer of other part of right lower leg with fat layer exposed: Secondary | ICD-10-CM | POA: Insufficient documentation

## 2018-09-08 DIAGNOSIS — M349 Systemic sclerosis, unspecified: Secondary | ICD-10-CM | POA: Diagnosis not present

## 2018-09-08 DIAGNOSIS — I87311 Chronic venous hypertension (idiopathic) with ulcer of right lower extremity: Secondary | ICD-10-CM | POA: Diagnosis not present

## 2018-09-08 DIAGNOSIS — L97312 Non-pressure chronic ulcer of right ankle with fat layer exposed: Secondary | ICD-10-CM | POA: Insufficient documentation

## 2018-09-08 DIAGNOSIS — I96 Gangrene, not elsewhere classified: Secondary | ICD-10-CM | POA: Diagnosis not present

## 2018-09-09 ENCOUNTER — Ambulatory Visit (INDEPENDENT_AMBULATORY_CARE_PROVIDER_SITE_OTHER): Payer: Medicare Other | Admitting: Internal Medicine

## 2018-09-09 VITALS — BP 130/80 | HR 80 | Temp 98.3°F

## 2018-09-09 DIAGNOSIS — Z23 Encounter for immunization: Secondary | ICD-10-CM

## 2018-09-09 NOTE — Addendum Note (Signed)
Addended by: Mady Haagensen on: 09/09/2018 04:59 PM   Modules accepted: Level of Service

## 2018-09-09 NOTE — Progress Notes (Signed)
Flu vaccine given by CMA

## 2018-09-09 NOTE — Patient Instructions (Signed)
Flu vaccine given.

## 2018-09-11 ENCOUNTER — Other Ambulatory Visit (HOSPITAL_COMMUNITY): Payer: Self-pay | Admitting: *Deleted

## 2018-09-11 MED ORDER — POTASSIUM CHLORIDE ER 10 MEQ PO TBCR: 10.0000 meq | EXTENDED_RELEASE_TABLET | Freq: Every day | ORAL | 3 refills | Status: DC

## 2018-09-15 ENCOUNTER — Encounter: Payer: Self-pay | Admitting: Internal Medicine

## 2018-09-15 ENCOUNTER — Ambulatory Visit (INDEPENDENT_AMBULATORY_CARE_PROVIDER_SITE_OTHER): Payer: Medicare Other | Admitting: Internal Medicine

## 2018-09-15 ENCOUNTER — Encounter: Payer: Self-pay | Admitting: Emergency Medicine

## 2018-09-15 ENCOUNTER — Ambulatory Visit (INDEPENDENT_AMBULATORY_CARE_PROVIDER_SITE_OTHER): Payer: Medicare Other | Admitting: Emergency Medicine

## 2018-09-15 ENCOUNTER — Telehealth: Payer: Self-pay | Admitting: Internal Medicine

## 2018-09-15 VITALS — BP 130/52 | Temp 99.1°F | Ht 67.0 in | Wt 144.0 lb

## 2018-09-15 DIAGNOSIS — J9611 Chronic respiratory failure with hypoxia: Secondary | ICD-10-CM

## 2018-09-15 DIAGNOSIS — I779 Disorder of arteries and arterioles, unspecified: Secondary | ICD-10-CM

## 2018-09-15 DIAGNOSIS — M79672 Pain in left foot: Secondary | ICD-10-CM

## 2018-09-15 DIAGNOSIS — I2729 Other secondary pulmonary hypertension: Secondary | ICD-10-CM

## 2018-09-15 DIAGNOSIS — J849 Interstitial pulmonary disease, unspecified: Secondary | ICD-10-CM

## 2018-09-15 MED ORDER — METHYLPREDNISOLONE ACETATE 80 MG/ML IJ SUSP
80.0000 mg | Freq: Once | INTRAMUSCULAR | Status: AC
  Administered 2018-09-15: 80 mg via INTRAMUSCULAR

## 2018-09-15 NOTE — Progress Notes (Signed)
   Subjective:    [name], female    DOB: 06-02-1951, 67 y.o.   MRN: [id number]  HPI Patient went to an estate sale instead on concrete for some 45 minutes yesterday.  Has had pain in her left foot.  In December 2018 we injected left foot with Depo-Medrol Marcaine and Xylocaine with relief.  She called today asking for another injection.  She has scleroderma and is on prednisone 5 mg daily.    Review of Systems see above     Objective:   Physical Exam  Tender plantar aspect mid left lateral foot.  Point tenderness present.      Assessment & Plan:  Soft tissue inflammation left foot-does not seem to be plantar fasciitis as location is higher than normal for that diagnosis  Plan: Injected left foot plantar aspect pinpoint tender area overall 1 cc, Xylocaine 1/2 cc and Marcaine 1/2 cc.  Patient tolerated procedure well.  Is to call if not progressing well in the next several days.

## 2018-09-15 NOTE — Telephone Encounter (Signed)
Patient called stating that you put a shot of cortisone in her foot a while back and it really helped it.  She wants to know if you would do that again sometime today.  We are booked.  I told her that we had an appointment tomorrow morning, but it seems that she is going to be at an appointment at Pulmonary this afternoon at 2:30, so she is wanting to know if there is any way possible that you could do it this afternoon while she is out??  I told her that I would have to ask you if you would be willing to work her in?

## 2018-09-15 NOTE — Telephone Encounter (Signed)
Work her in- may have to wait.

## 2018-09-15 NOTE — Progress Notes (Signed)
Subjective:    [name], female    DOB: 1951-12-12, 67 y.o.   MRN: [id number] HPI 67 yo woman, never smoker, hx of scleroderma, Raynaud's, non-Hodgkins lymphoma (treated '10), HTN, PAH originally dx in [place] and treated, but meds d/c'd when she improved. Has been seen in by Dr [name], had R heart cath as below in 03/2012. follow up 03/2017.   ROV 07/22/17 -- this follow-up visit for patient with a history of scleroderma. She has secondary pulmonary hypertension that we have managed with sildenafil, macetentan. She added selexipag in May 2018. She experienced headache, has had abdominal discomfort and diarrhea - she had to stop it after about 2-3 weeks. Her last chest x-ray was on 10/31/16 and was stable. Stable on prednisone 62m qd. Was treated with a higher dose methylpred taper for ear fullness. Her exertional tolerance is stable. She uses her o2 at 2-3L/min. She does not have a 6 minute walk or TTE scheduled at this time.   ROV 10/23/17 --patient has a history of scleroderma with secondary pulmonary hypertension.  She is currently being treated with oxygen at 2-3 L/min.  Current regimen includes macitentan, sildenafil, torsemide 40 mg twice daily. She has ischemia to R second digit, will need an amputation under local. She is on pred 565m stable dose. Follow with Dr BeAmil AmenShe has a TTE in December. She has L foot pain, being worked up, wounds are healing. Unable to do 6 minute walk at this time.   04/30/2018 --pleasant 89ear old woman with a history of scleroderma and associated secondary pulmonary arterial hypertension.  She has chronic hypoxemia and is on oxygen at 2 3 L/min.  She remains on prednisone 5 mg daily, follows with Dr. BeAmil Amenith rheumatology. She is having a lot of congestion and some dry cough from the pollen. She uses coricidin at night. She is on sildenafil, macitentan, torsemide. TTE from 11/2017 > moderate decreased RV fxn, no PASP estimated.   ROV 09/15/18 --this  follow-up visit for [age]ear old woman with scleroderma and associated interstitial lung disease with pulmonary hypertension.  She has chronic hypoxemia and is on oxygen at 2 to 3 L/min.  Currently maintained on prednisone 5 mg daily.  Pulmonary hypertension regimen includes sildenafil recently increased 60 mg 3 times daily, macitentan 10 mg daily. She is having a hard time getting Opsumit, very expensive even w current coverage. She has tolerated the change to 6033mildenafil.     04/19/17 --  RA = 4 RV = 80/8 PA = 79/17 (43) PCW = 6 Fick cardiac output/index = 4.1/2.4 PVR = 9.0 WU Ao sat = 94% PA sat = 59%, 62%   Underwent RHC on 04/18/12 as below:  RA = 9  RV = 75/11/14  PA = 71/25 (44)  PCW = 4  Fick cardiac output/index = 3.0/1.7  Thermo CO/CI = 2.7/1.5  PVR = 23.5 Woods  FA sat = 79% on room air (checked 3 times). Sat with finger probe on air 94%  PA sat = 41%, 43%  MW 04/18/12 880 feet  No results for input(s): HGB, HCT, WBC, PLT in the last 168 hours.    Review of Systems  Constitutional: Negative for appetite change, fever and unexpected weight change.  HENT: Negative for congestion, dental problem, ear pain, nosebleeds, postnasal drip, rhinorrhea, sinus pressure, sneezing, sore throat and trouble swallowing.   Eyes: Negative for redness and itching.  Respiratory: Positive for shortness of breath. Negative for cough, chest tightness and wheezing.  Cardiovascular: Negative for palpitations and leg swelling.  Gastrointestinal: Negative for nausea and vomiting.  Genitourinary: Negative for dysuria.  Musculoskeletal: Negative for joint swelling.  Skin: Negative for rash.  Neurological: Negative for headaches.  Hematological: Does not bruise/bleed easily.  Psychiatric/Behavioral: Negative for dysphoric mood. The patient is not nervous/anxious.        Objective:   Physical Exam Vitals:   09/15/18 1434  BP: (!) 102/52  Pulse: 75  SpO2: 99%  Weight: 144 lb (65.3 kg)    Gen: Pleasant, thin, in no distress,  normal affect on pulsed nasal cannula O2  ENT: No lesions,  mouth clear,  oropharynx clear, no postnasal drip  Neck: No JVD, no stridor  Lungs: No use of accessory muscles, few bibasilar insp crackles  Cardiovascular: RRR, heart sounds normal, no murmur or gallops, trace peripheral edema  Musculoskeletal: No deformities, no cyanosis or clubbing  Neuro: alert, non focal  Skin: significant changes from scleroderma on arms, hands, fingers; 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   Right lower extremity shin and calf are wrapped and dressed      Assessment & Plan:  Pulmonary hypertension associated with systemic disorder Please continue sildenafil 60 mg 3 times a day Please continue macitentan 10 mg daily. We will discuss your medicines with Dr. [name] to see if we can come up with a more cost effective substitution to the macitentan.  Bosentan and ambrisentan might both be options. Follow with Dr Lamonte Sakai in 4 months or sooner if you have any problems  ILD (interstitial lung disease) ([place]) Please continue your prednisone 5 mg daily. We will plan the timing of repeat chest imaging at your next visit to ensure no interval change.  Chronic respiratory failure (HCC) Continue oxygen 2- 3 L/min as you have been using it.  Baltazar Apo, MD, PhD 09/15/2018, 2:58 PM [facility] [phone] or if no answer [phone]

## 2018-09-15 NOTE — Assessment & Plan Note (Signed)
Please continue sildenafil 60 mg 3 times a day Please continue macitentan 10 mg daily. We will discuss your medicines with Dr. [name] to see if we can come up with a more cost effective substitution to the macitentan.  Bosentan and ambrisentan might both be options. Follow with Dr Lamonte Sakai in 4 months or sooner if you have any problems

## 2018-09-15 NOTE — Patient Instructions (Addendum)
Please continue your prednisone 5 mg daily. Continue oxygen 2- 3 L/min as you have been using it. We will plan the timing of repeat chest imaging at your next visit to ensure no interval change. Please continue sildenafil 60 mg 3 times a day Please continue macitentan 10 mg daily. We will discuss your medicines with Dr. [name] to see if we can come up with a more cost effective substitution to the macitentan.  Bosentan and ambrisentan might both be options. Follow with Dr Lamonte Sakai in 4 months or sooner if you have any problems.

## 2018-09-15 NOTE — Assessment & Plan Note (Signed)
Please continue your prednisone 5 mg daily. We will plan the timing of repeat chest imaging at your next visit to ensure no interval change.

## 2018-09-15 NOTE — Assessment & Plan Note (Signed)
Continue oxygen 2- 3 L/min as you have been using it.

## 2018-09-15 NOTE — Telephone Encounter (Signed)
Spoke with patient, she is grateful.  She will come by after her appointment at Pulmonary this afternoon.  Patient understands that she is a work-in and that she will have to wait.

## 2018-09-15 NOTE — Patient Instructions (Signed)
Left foot injected with Depo-Medrol Marcaine and Xylocaine.  Call if not progressing well in the next few days.

## 2018-09-16 DIAGNOSIS — L820 Inflamed seborrheic keratosis: Secondary | ICD-10-CM | POA: Diagnosis not present

## 2018-09-22 DIAGNOSIS — I87311 Chronic venous hypertension (idiopathic) with ulcer of right lower extremity: Secondary | ICD-10-CM | POA: Diagnosis not present

## 2018-09-22 DIAGNOSIS — L97212 Non-pressure chronic ulcer of right calf with fat layer exposed: Secondary | ICD-10-CM | POA: Diagnosis not present

## 2018-09-22 DIAGNOSIS — L97812 Non-pressure chronic ulcer of other part of right lower leg with fat layer exposed: Secondary | ICD-10-CM | POA: Diagnosis not present

## 2018-09-22 DIAGNOSIS — I87331 Chronic venous hypertension (idiopathic) with ulcer and inflammation of right lower extremity: Secondary | ICD-10-CM | POA: Diagnosis not present

## 2018-09-22 DIAGNOSIS — I10 Essential (primary) hypertension: Secondary | ICD-10-CM | POA: Diagnosis not present

## 2018-09-22 DIAGNOSIS — L97312 Non-pressure chronic ulcer of right ankle with fat layer exposed: Secondary | ICD-10-CM | POA: Diagnosis not present

## 2018-09-29 ENCOUNTER — Other Ambulatory Visit: Payer: Self-pay | Admitting: Internal Medicine

## 2018-09-29 DIAGNOSIS — I96 Gangrene, not elsewhere classified: Secondary | ICD-10-CM | POA: Diagnosis not present

## 2018-09-29 DIAGNOSIS — M349 Systemic sclerosis, unspecified: Secondary | ICD-10-CM | POA: Diagnosis not present

## 2018-09-30 DIAGNOSIS — Z1231 Encounter for screening mammogram for malignant neoplasm of breast: Secondary | ICD-10-CM | POA: Diagnosis not present

## 2018-10-06 ENCOUNTER — Encounter (HOSPITAL_BASED_OUTPATIENT_CLINIC_OR_DEPARTMENT_OTHER): Payer: Medicare Other | Attending: Internal Medicine

## 2018-10-06 DIAGNOSIS — I10 Essential (primary) hypertension: Secondary | ICD-10-CM | POA: Diagnosis not present

## 2018-10-06 DIAGNOSIS — L97812 Non-pressure chronic ulcer of other part of right lower leg with fat layer exposed: Secondary | ICD-10-CM | POA: Diagnosis not present

## 2018-10-06 DIAGNOSIS — I7300 Raynaud's syndrome without gangrene: Secondary | ICD-10-CM | POA: Diagnosis not present

## 2018-10-06 DIAGNOSIS — L97312 Non-pressure chronic ulcer of right ankle with fat layer exposed: Secondary | ICD-10-CM | POA: Diagnosis not present

## 2018-10-06 DIAGNOSIS — I87331 Chronic venous hypertension (idiopathic) with ulcer and inflammation of right lower extremity: Secondary | ICD-10-CM | POA: Insufficient documentation

## 2018-10-06 DIAGNOSIS — M069 Rheumatoid arthritis, unspecified: Secondary | ICD-10-CM | POA: Diagnosis not present

## 2018-10-06 DIAGNOSIS — L942 Calcinosis cutis: Secondary | ICD-10-CM | POA: Insufficient documentation

## 2018-10-06 DIAGNOSIS — I739 Peripheral vascular disease, unspecified: Secondary | ICD-10-CM | POA: Diagnosis not present

## 2018-10-06 DIAGNOSIS — S81801A Unspecified open wound, right lower leg, initial encounter: Secondary | ICD-10-CM | POA: Diagnosis not present

## 2018-10-06 DIAGNOSIS — S91001A Unspecified open wound, right ankle, initial encounter: Secondary | ICD-10-CM | POA: Diagnosis not present

## 2018-10-07 ENCOUNTER — Other Ambulatory Visit (HOSPITAL_COMMUNITY): Payer: Self-pay

## 2018-10-07 MED ORDER — SILDENAFIL CITRATE 20 MG PO TABS: 60.0000 mg | ORAL_TABLET | Freq: Three times a day (TID) | ORAL | 1 refills | Status: DC

## 2018-10-09 ENCOUNTER — Telehealth (HOSPITAL_COMMUNITY): Payer: Self-pay | Admitting: Pharmacist

## 2018-10-09 NOTE — Telephone Encounter (Signed)
Sildenafil 20 mg #270 tablets per 30 days PA approved by [employer] Part D through 12/30/18.   Ruta Hinds. Velva Harman, PharmD, BCPS, CPP Clinical Pharmacist Phone: [phone] 10/09/2018 2:23 PM

## 2018-10-14 DIAGNOSIS — H25813 Combined forms of age-related cataract, bilateral: Secondary | ICD-10-CM | POA: Diagnosis not present

## 2018-10-14 DIAGNOSIS — H04123 Dry eye syndrome of bilateral lacrimal glands: Secondary | ICD-10-CM | POA: Diagnosis not present

## 2018-10-16 DIAGNOSIS — G629 Polyneuropathy, unspecified: Secondary | ICD-10-CM | POA: Diagnosis not present

## 2018-10-16 DIAGNOSIS — M810 Age-related osteoporosis without current pathological fracture: Secondary | ICD-10-CM | POA: Diagnosis not present

## 2018-10-16 DIAGNOSIS — K219 Gastro-esophageal reflux disease without esophagitis: Secondary | ICD-10-CM | POA: Diagnosis not present

## 2018-10-16 DIAGNOSIS — M340 Progressive systemic sclerosis: Secondary | ICD-10-CM | POA: Diagnosis not present

## 2018-10-16 DIAGNOSIS — Z6821 Body mass index (BMI) 21.0-21.9, adult: Secondary | ICD-10-CM | POA: Diagnosis not present

## 2018-10-16 DIAGNOSIS — I7301 Raynaud's syndrome with gangrene: Secondary | ICD-10-CM | POA: Diagnosis not present

## 2018-10-16 DIAGNOSIS — S81801D Unspecified open wound, right lower leg, subsequent encounter: Secondary | ICD-10-CM | POA: Diagnosis not present

## 2018-10-16 DIAGNOSIS — D638 Anemia in other chronic diseases classified elsewhere: Secondary | ICD-10-CM | POA: Diagnosis not present

## 2018-10-16 DIAGNOSIS — M79672 Pain in left foot: Secondary | ICD-10-CM | POA: Diagnosis not present

## 2018-10-20 ENCOUNTER — Other Ambulatory Visit (HOSPITAL_COMMUNITY): Payer: Self-pay

## 2018-10-20 DIAGNOSIS — I739 Peripheral vascular disease, unspecified: Secondary | ICD-10-CM | POA: Diagnosis not present

## 2018-10-20 DIAGNOSIS — I10 Essential (primary) hypertension: Secondary | ICD-10-CM | POA: Diagnosis not present

## 2018-10-20 DIAGNOSIS — L942 Calcinosis cutis: Secondary | ICD-10-CM | POA: Diagnosis not present

## 2018-10-20 DIAGNOSIS — L97312 Non-pressure chronic ulcer of right ankle with fat layer exposed: Secondary | ICD-10-CM | POA: Diagnosis not present

## 2018-10-20 DIAGNOSIS — I87311 Chronic venous hypertension (idiopathic) with ulcer of right lower extremity: Secondary | ICD-10-CM | POA: Diagnosis not present

## 2018-10-20 DIAGNOSIS — L97812 Non-pressure chronic ulcer of other part of right lower leg with fat layer exposed: Secondary | ICD-10-CM | POA: Diagnosis not present

## 2018-10-20 DIAGNOSIS — I87331 Chronic venous hypertension (idiopathic) with ulcer and inflammation of right lower extremity: Secondary | ICD-10-CM | POA: Diagnosis not present

## 2018-10-20 MED ORDER — SILDENAFIL CITRATE 20 MG PO TABS: 60.0000 mg | ORAL_TABLET | Freq: Three times a day (TID) | ORAL | 1 refills | Status: DC

## 2018-10-22 DIAGNOSIS — I96 Gangrene, not elsewhere classified: Secondary | ICD-10-CM | POA: Diagnosis not present

## 2018-10-22 DIAGNOSIS — M349 Systemic sclerosis, unspecified: Secondary | ICD-10-CM | POA: Diagnosis not present

## 2018-11-03 ENCOUNTER — Encounter (HOSPITAL_BASED_OUTPATIENT_CLINIC_OR_DEPARTMENT_OTHER): Payer: Medicare Other | Attending: Internal Medicine

## 2018-11-03 DIAGNOSIS — I10 Essential (primary) hypertension: Secondary | ICD-10-CM | POA: Insufficient documentation

## 2018-11-03 DIAGNOSIS — I87331 Chronic venous hypertension (idiopathic) with ulcer and inflammation of right lower extremity: Secondary | ICD-10-CM | POA: Insufficient documentation

## 2018-11-03 DIAGNOSIS — L97312 Non-pressure chronic ulcer of right ankle with fat layer exposed: Secondary | ICD-10-CM | POA: Diagnosis not present

## 2018-11-03 DIAGNOSIS — L97812 Non-pressure chronic ulcer of other part of right lower leg with fat layer exposed: Secondary | ICD-10-CM | POA: Diagnosis not present

## 2018-11-03 DIAGNOSIS — I87311 Chronic venous hypertension (idiopathic) with ulcer of right lower extremity: Secondary | ICD-10-CM | POA: Diagnosis not present

## 2018-11-05 DIAGNOSIS — M349 Systemic sclerosis, unspecified: Secondary | ICD-10-CM | POA: Diagnosis not present

## 2018-11-12 DIAGNOSIS — I96 Gangrene, not elsewhere classified: Secondary | ICD-10-CM | POA: Diagnosis not present

## 2018-11-12 DIAGNOSIS — M349 Systemic sclerosis, unspecified: Secondary | ICD-10-CM | POA: Diagnosis not present

## 2018-11-17 DIAGNOSIS — L97212 Non-pressure chronic ulcer of right calf with fat layer exposed: Secondary | ICD-10-CM | POA: Diagnosis not present

## 2018-11-17 DIAGNOSIS — I10 Essential (primary) hypertension: Secondary | ICD-10-CM | POA: Diagnosis not present

## 2018-11-17 DIAGNOSIS — L97312 Non-pressure chronic ulcer of right ankle with fat layer exposed: Secondary | ICD-10-CM | POA: Diagnosis not present

## 2018-11-17 DIAGNOSIS — L97812 Non-pressure chronic ulcer of other part of right lower leg with fat layer exposed: Secondary | ICD-10-CM | POA: Diagnosis not present

## 2018-11-17 DIAGNOSIS — I87311 Chronic venous hypertension (idiopathic) with ulcer of right lower extremity: Secondary | ICD-10-CM | POA: Diagnosis not present

## 2018-11-17 DIAGNOSIS — I87331 Chronic venous hypertension (idiopathic) with ulcer and inflammation of right lower extremity: Secondary | ICD-10-CM | POA: Diagnosis not present

## 2018-11-21 ENCOUNTER — Other Ambulatory Visit: Payer: Self-pay | Admitting: Orthopedic Surgery

## 2018-11-21 ENCOUNTER — Ambulatory Visit (HOSPITAL_BASED_OUTPATIENT_CLINIC_OR_DEPARTMENT_OTHER): Payer: Medicare Other | Admitting: Anesthesiology

## 2018-11-21 ENCOUNTER — Encounter (HOSPITAL_BASED_OUTPATIENT_CLINIC_OR_DEPARTMENT_OTHER)
Admission: AD | Disposition: A | Discharge status: Home / Self Care | Payer: Self-pay | Source: Ambulatory Visit | Attending: Orthopedic Surgery

## 2018-11-21 ENCOUNTER — Ambulatory Visit (HOSPITAL_BASED_OUTPATIENT_CLINIC_OR_DEPARTMENT_OTHER)
Admission: AD | Admit: 2018-11-21 | Discharge: 2018-11-21 | Disposition: A | Discharge status: Home / Self Care | Payer: Medicare Other | Source: Ambulatory Visit | Attending: Orthopedic Surgery | Admitting: Orthopedic Surgery

## 2018-11-21 ENCOUNTER — Other Ambulatory Visit: Payer: Self-pay

## 2018-11-21 ENCOUNTER — Encounter (HOSPITAL_BASED_OUTPATIENT_CLINIC_OR_DEPARTMENT_OTHER): Payer: Self-pay

## 2018-11-21 DIAGNOSIS — I129 Hypertensive chronic kidney disease with stage 1 through stage 4 chronic kidney disease, or unspecified chronic kidney disease: Secondary | ICD-10-CM | POA: Diagnosis not present

## 2018-11-21 DIAGNOSIS — E039 Hypothyroidism, unspecified: Secondary | ICD-10-CM | POA: Diagnosis not present

## 2018-11-21 DIAGNOSIS — Z882 Allergy status to sulfonamides status: Secondary | ICD-10-CM | POA: Insufficient documentation

## 2018-11-21 DIAGNOSIS — I96 Gangrene, not elsewhere classified: Secondary | ICD-10-CM | POA: Insufficient documentation

## 2018-11-21 DIAGNOSIS — E785 Hyperlipidemia, unspecified: Secondary | ICD-10-CM | POA: Diagnosis not present

## 2018-11-21 DIAGNOSIS — I2721 Secondary pulmonary arterial hypertension: Secondary | ICD-10-CM | POA: Diagnosis not present

## 2018-11-21 DIAGNOSIS — I10 Essential (primary) hypertension: Secondary | ICD-10-CM | POA: Insufficient documentation

## 2018-11-21 DIAGNOSIS — Z85820 Personal history of malignant melanoma of skin: Secondary | ICD-10-CM | POA: Insufficient documentation

## 2018-11-21 DIAGNOSIS — Z881 Allergy status to other antibiotic agents status: Secondary | ICD-10-CM | POA: Diagnosis not present

## 2018-11-21 DIAGNOSIS — Z79899 Other long term (current) drug therapy: Secondary | ICD-10-CM | POA: Insufficient documentation

## 2018-11-21 DIAGNOSIS — Z8572 Personal history of non-Hodgkin lymphomas: Secondary | ICD-10-CM | POA: Diagnosis not present

## 2018-11-21 DIAGNOSIS — M868X4 Other osteomyelitis, hand: Secondary | ICD-10-CM | POA: Insufficient documentation

## 2018-11-21 DIAGNOSIS — M1990 Unspecified osteoarthritis, unspecified site: Secondary | ICD-10-CM | POA: Diagnosis not present

## 2018-11-21 DIAGNOSIS — M349 Systemic sclerosis, unspecified: Secondary | ICD-10-CM | POA: Diagnosis not present

## 2018-11-21 DIAGNOSIS — M87044 Idiopathic aseptic necrosis of right finger(s): Secondary | ICD-10-CM | POA: Diagnosis not present

## 2018-11-21 DIAGNOSIS — N183 Chronic kidney disease, stage 3 (moderate): Secondary | ICD-10-CM | POA: Diagnosis not present

## 2018-11-21 HISTORY — PX: AMPUTATION: SHX166

## 2018-11-21 LAB — POCT I-STAT, CHEM 8
BUN: 35 mg/dL — ABNORMAL HIGH (ref 8–23)
Calcium, Ion: 0.84 mmol/L — CL (ref 1.15–1.40)
Chloride: 95 mmol/L — ABNORMAL LOW (ref 98–111)
Creatinine, Ser: 1.4 mg/dL — ABNORMAL HIGH (ref 0.44–1.00)
Glucose, Bld: 109 mg/dL — ABNORMAL HIGH (ref 70–99)
HCT: 35 % — ABNORMAL LOW (ref 36.0–46.0)
Hemoglobin: 11.9 g/dL — ABNORMAL LOW (ref 12.0–15.0)
Potassium: 3.3 mmol/L — ABNORMAL LOW (ref 3.5–5.1)
Sodium: 137 mmol/L (ref 135–145)
TCO2: 29 mmol/L (ref 22–32)

## 2018-11-21 SURGERY — AMPUTATION, FOOT, RAY
Anesthesia: Monitor Anesthesia Care | Laterality: Right

## 2018-11-21 MED ORDER — OXYCODONE HCL 5 MG/5ML PO SOLN: 5.0000 mg | Freq: Once | ORAL | Status: DC | PRN

## 2018-11-21 MED ORDER — ACETAMINOPHEN 325 MG PO TABS: 325.0000 mg | ORAL_TABLET | ORAL | Status: DC | PRN

## 2018-11-21 MED ORDER — MIDAZOLAM HCL 2 MG/2ML IJ SOLN: 1.0000 mg | INTRAMUSCULAR | Status: DC | PRN

## 2018-11-21 MED ORDER — LACTATED RINGERS IV SOLN
INTRAVENOUS | Status: DC
  Administered 2018-11-21: 14:00:00 via INTRAVENOUS

## 2018-11-21 MED ORDER — VANCOMYCIN HCL IN DEXTROSE 1-5 GM/200ML-% IV SOLN
1000.0000 mg | INTRAVENOUS | Status: AC
  Administered 2018-11-21: 1000 mg via INTRAVENOUS

## 2018-11-21 MED ORDER — FENTANYL CITRATE (PF) 100 MCG/2ML IJ SOLN: 25.0000 ug | INTRAMUSCULAR | Status: DC | PRN

## 2018-11-21 MED ORDER — ROPIVACAINE HCL 7.5 MG/ML IJ SOLN
INTRAMUSCULAR | Status: DC | PRN
  Administered 2018-11-21: 30 mL via PERINEURAL

## 2018-11-21 MED ORDER — HYDROCODONE-ACETAMINOPHEN 5-325 MG PO TABS: 1.0000 | ORAL_TABLET | Freq: Four times a day (QID) | ORAL | 0 refills | Status: DC | PRN

## 2018-11-21 MED ORDER — MEPERIDINE HCL 25 MG/ML IJ SOLN: 6.2500 mg | INTRAMUSCULAR | Status: DC | PRN

## 2018-11-21 MED ORDER — PROPOFOL 500 MG/50ML IV EMUL
INTRAVENOUS | Status: DC | PRN
  Administered 2018-11-21: 35 ug/kg/min via INTRAVENOUS

## 2018-11-21 MED ORDER — MIDAZOLAM HCL 2 MG/2ML IJ SOLN
INTRAMUSCULAR | Status: AC
  Filled 2018-11-21: qty 2

## 2018-11-21 MED ORDER — ACETAMINOPHEN 160 MG/5ML PO SOLN: 325.0000 mg | ORAL | Status: DC | PRN

## 2018-11-21 MED ORDER — VANCOMYCIN HCL IN DEXTROSE 1-5 GM/200ML-% IV SOLN
INTRAVENOUS | Status: AC
  Filled 2018-11-21: qty 200

## 2018-11-21 MED ORDER — 0.9 % SODIUM CHLORIDE (POUR BTL) OPTIME
TOPICAL | Status: DC | PRN
  Administered 2018-11-21: 1000 mL

## 2018-11-21 MED ORDER — ONDANSETRON HCL 4 MG/2ML IJ SOLN
INTRAMUSCULAR | Status: DC | PRN
  Administered 2018-11-21: 4 mg via INTRAVENOUS

## 2018-11-21 MED ORDER — DOXYCYCLINE HYCLATE 50 MG PO CAPS: 50.0000 mg | ORAL_CAPSULE | Freq: Two times a day (BID) | ORAL | 0 refills | Status: DC

## 2018-11-21 MED ORDER — SCOPOLAMINE 1 MG/3DAYS TD PT72: 1.0000 | MEDICATED_PATCH | Freq: Once | TRANSDERMAL | Status: DC | PRN

## 2018-11-21 MED ORDER — CHLORHEXIDINE GLUCONATE 4 % EX LIQD: 60.0000 mL | Freq: Once | CUTANEOUS | Status: DC

## 2018-11-21 MED ORDER — BUPIVACAINE HCL (PF) 0.25 % IJ SOLN
INTRAMUSCULAR | Status: DC | PRN
  Administered 2018-11-21: 10 mL

## 2018-11-21 MED ORDER — FENTANYL CITRATE (PF) 100 MCG/2ML IJ SOLN
INTRAMUSCULAR | Status: AC
  Filled 2018-11-21: qty 2

## 2018-11-21 MED ORDER — OXYCODONE HCL 5 MG PO TABS: 5.0000 mg | ORAL_TABLET | Freq: Once | ORAL | Status: DC | PRN

## 2018-11-21 MED ORDER — FENTANYL CITRATE (PF) 100 MCG/2ML IJ SOLN
50.0000 ug | INTRAMUSCULAR | Status: DC | PRN
  Administered 2018-11-21: 100 ug via INTRAVENOUS

## 2018-11-21 SURGICAL SUPPLY — 44 items
BLADE MINI RND TIP GREEN BEAV (BLADE) IMPLANT
BLADE OSC/SAG .038X5.5 CUT EDG (BLADE) IMPLANT
BLADE SURG 15 STRL LF DISP TIS (BLADE) ×1 IMPLANT
BLADE SURG 15 STRL SS (BLADE) ×1
BNDG COHESIVE 1X5 TAN STRL LF (GAUZE/BANDAGES/DRESSINGS) ×2 IMPLANT
BNDG ESMARK 4X9 LF (GAUZE/BANDAGES/DRESSINGS) ×2 IMPLANT
CHLORAPREP W/TINT 26ML (MISCELLANEOUS) ×2 IMPLANT
CORD BIPOLAR FORCEPS 12FT (ELECTRODE) ×2 IMPLANT
COVER BACK TABLE 60X90IN (DRAPES) ×2 IMPLANT
COVER MAYO STAND STRL (DRAPES) ×2 IMPLANT
COVER WAND RF STERILE (DRAPES) IMPLANT
CUFF TOURNIQUET SINGLE 18IN (TOURNIQUET CUFF) ×2 IMPLANT
DECANTER SPIKE VIAL GLASS SM (MISCELLANEOUS) IMPLANT
DRAIN PENROSE 1/4X12 LTX STRL (WOUND CARE) IMPLANT
DRAPE EXTREMITY T 121X128X90 (DRAPE) ×2 IMPLANT
DRAPE OEC MINIVIEW 54X84 (DRAPES) IMPLANT
DRAPE SURG 17X23 STRL (DRAPES) ×2 IMPLANT
GAUZE SPONGE 4X4 12PLY STRL (GAUZE/BANDAGES/DRESSINGS) ×2 IMPLANT
GAUZE XEROFORM 1X8 LF (GAUZE/BANDAGES/DRESSINGS) ×2 IMPLANT
GLOVE BIO SURGEON STRL SZ7.5 (GLOVE) ×2 IMPLANT
GLOVE BIOGEL M STRL SZ7.5 (GLOVE) ×2 IMPLANT
GLOVE BIOGEL PI IND STRL 8.5 (GLOVE) ×1 IMPLANT
GLOVE BIOGEL PI INDICATOR 8.5 (GLOVE) ×1
GLOVE SURG ORTHO 8.0 STRL STRW (GLOVE) ×2 IMPLANT
GOWN STRL REUS W/ TWL LRG LVL3 (GOWN DISPOSABLE) IMPLANT
GOWN STRL REUS W/TWL LRG LVL3 (GOWN DISPOSABLE)
GOWN STRL REUS W/TWL XL LVL3 (GOWN DISPOSABLE) ×4 IMPLANT
LOOP VESSEL MAXI BLUE (MISCELLANEOUS) ×2 IMPLANT
NS IRRIG 1000ML POUR BTL (IV SOLUTION) ×2 IMPLANT
PACK BASIN DAY SURGERY FS (CUSTOM PROCEDURE TRAY) ×2 IMPLANT
PADDING CAST ABS 4INX4YD NS (CAST SUPPLIES) ×1
PADDING CAST ABS COTTON 4X4 ST (CAST SUPPLIES) ×1 IMPLANT
SPLINT FINGER 4.25 BULB 911906 (SOFTGOODS) IMPLANT
STOCKINETTE 4X48 STRL (DRAPES) ×2 IMPLANT
SUT CHROMIC 4 0 P 3 18 (SUTURE) IMPLANT
SUT ETHILON 4 0 PS 2 18 (SUTURE) ×2 IMPLANT
SUT MERSILENE 4 0 P 3 (SUTURE) IMPLANT
SUT VIC AB 4-0 P-3 18XBRD (SUTURE) IMPLANT
SUT VIC AB 4-0 P3 18 (SUTURE)
SWAB CULTURE ESWAB REG 1ML (MISCELLANEOUS) ×2 IMPLANT
SYR BULB 3OZ (MISCELLANEOUS) ×2 IMPLANT
SYR CONTROL 10ML LL (SYRINGE) IMPLANT
TOWEL GREEN STERILE FF (TOWEL DISPOSABLE) ×2 IMPLANT
UNDERPAD 30X30 (UNDERPADS AND DIAPERS) ×2 IMPLANT

## 2018-11-21 NOTE — H&P (Signed)
[name] [name] is an 67 y.o. female.   Chief Complaint: gangrene right handHPI: [name] [name] is a 67 year old female with a long history of scleroderma.  This been affecting her right hand.  He has had a ray resection of her right index finger in the past for gangrene.  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  She has undergone digital sympathectomies but has had continued complaints of pain increasing loss of tissue.  She is been scheduled to see Dr. Gershon Mussel for the possibility of Botox injections for arterial insufficiency.  She has chronic doxycycline for suppression of bacteria.  The called with increasing pain swelling of her middle finger.  She has no history of diabetes.  She does have a history of arthritis.  She has a history of scleroderma.  Past Medical History:  Diagnosis Date  . Anemia   . Arthritis   . DOE (dyspnea on exertion)   . Epistaxis   . History of chicken pox   . Hypertension   . Hypothyroidism   . Leg ulcer ([place])   . Melanoma ([place]) 1999  . 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[place])   . Non Hodgkin's lymphoma ([place])    Treated with chemo 2010, felt to be cured.  Marland Kitchen PAH (pulmonary artery hypertension) ([place])   . Peripheral arterial occlusive disease ([place])   . Pulmonary hypertension ([place])   . Requires supplemental oxygen    2 liters-3liters when moving  . Sclerodermia generalized [facility])     Past Surgical History:  Procedure Laterality Date  . AMPUTATION Right 10/24/2017   Procedure: RAY resection right index;  Surgeon: Daryll Brod, MD;  Location: [place];  Service: Orthopedics;  Laterality: Right;  Axillary block  . apligraph  12-04-2102,   12-18-2012   bilateral calves   [name]  . biopsy on cerebellum    . COLONOSCOPY WITH PROPOFOL Left 10/07/2015   Procedure: COLONOSCOPY WITH PROPOFOL;  Surgeon: [name], MD;  Location: [place];  Service: Endoscopy;  Laterality: Left;  . ENDARTERECTOMY FEMORAL Right 09/14/2016   Procedure: ENDARTERECTOMY  FEMORAL RIGHT;  Surgeon: [name], MD;  Location: [facility] OR;  Service: Vascular;  Laterality: Right;  . ENDOVENOUS ABLATION SAPHENOUS VEIN W/ LASER  07-24-2012   right greater saphenous vein by [name], M.D.   . ENDOVENOUS ABLATION SAPHENOUS VEIN W/ LASER  10-16-2012   left greater saphenous vein  by Dr. [name]  . ENDOVENOUS ABLATION SAPHENOUS VEIN W/ LASER Right 09-01-2015   endovenous laser ablation right greater saphenous vein by [name] MD  . ESOPHAGOGASTRODUODENOSCOPY N/A 05/07/2016   Procedure: ESOPHAGOGASTRODUODENOSCOPY (EGD);  Surgeon: [name], MD;  Location: [facility] ENDOSCOPY;  Service: Endoscopy;  Laterality: N/A;  . ESOPHAGOGASTRODUODENOSCOPY (EGD) WITH PROPOFOL Left 10/07/2015   Procedure: ESOPHAGOGASTRODUODENOSCOPY (EGD) WITH PROPOFOL;  Surgeon: [name], MD;  Location: [place];  Service: Endoscopy;  Laterality: Left;  . GIVENS CAPSULE STUDY N/A 07/31/2016   Procedure: GIVENS CAPSULE STUDY;  Surgeon: [name], MD;  Location: [facility] ENDOSCOPY;  Service: Endoscopy;  Laterality: N/A;  . melonoma removes Right    behind right knee  . PATCH ANGIOPLASTY Right 09/14/2016   Procedure: PATCH ANGIOPLASTY RIGHT FEMORAL ARTERY USING HEMASHIELD PLATINUM FINESSE PATCH;  Surgeon: [name], MD;  Location: [place];  Service: Vascular;  Laterality: Right;  . PERIPHERAL VASCULAR CATHETERIZATION N/A 09/07/2016   Procedure: Abdominal Aortogram w/Lower Extremity;  Surgeon: [name], MD;  Location: [place] CV LAB;  Service: Cardiovascular;  Laterality: N/A;  . RIGHT HEART CATH N/A 04/19/2017   Procedure: Right Heart Cath;  Surgeon: [name], MD;  Location: [place] CV LAB;  Service: Cardiovascular;  Laterality: N/A;  . RIGHT HEART CATHETERIZATION N/A 11/14/2012   Procedure: RIGHT HEART CATH;  Surgeon: [name], MD;  Location: [facility] CATH LAB;  Service: Cardiovascular;  Laterality: N/A;  . rt little toe removal  10/1998  . stent left thigh  3/11  . TENDON TRANSFER  Right 10/24/2017   Procedure: transfer extensor indecus propius/extensor digitorum commomus index to middle ring and small;  Surgeon: Daryll Brod, MD;  Location: [place];  Service: Orthopedics;  Laterality: Right;  . TONSILLECTOMY    . TONSILLECTOMY AND ADENOIDECTOMY    . TUNNELED VENOUS CATHETER PLACEMENT  01/2013  . ULNAR HEAD EXCISION Right 10/24/2017   Procedure: darrach right ulna;  Surgeon: Daryll Brod, MD;  Location: [place];  Service: Orthopedics;  Laterality: Right;  . VEIN LIGATION AND STRIPPING  05/16/2012   Procedure: VEIN LIGATION AND STRIPPING;  Surgeon: [name], MD;  Location: [facility] OR;  Service: Vascular;  Laterality: Right;  Irrigation and Debridement right lower leg, ligation of saphenous vein.    Family History  Problem Relation Age of Onset  . Lupus Father   . Lymphoma Father   . Hyperlipidemia Mother   . Cancer Daughter        sarcoma  . Colon cancer Unknown    Social History:  reports that she has never smoked. She has never used smokeless tobacco. She reports that she drinks alcohol. She reports that she does not use drugs.  Allergies:  Allergies  Allergen Reactions  . Levofloxacin Other (See Comments)    FLU LIKE SYMPTOM  . Sulfa Antibiotics Rash and Other (See Comments)    Face peeled Severe rash with significant peeling of face. No airway involvement per patient  . Levaquin [Levofloxacin In D5w] Other (See Comments)    FLU LIKE SYMPTOMS  . Doxycycline Hyclate Swelling    SWELLING REACTION UNSPECIFIED  "TABLETS ONLY - CAPSULES ARE TOLERATED FINE"  . Penicillins Rash    Has patient had a PCN reaction causing immediate rash, facial/tongue/throat swelling, SOB or lightheadedness with hypotension: no Has patient had a PCN reaction causing severe rash involving mucus membranes or skin necrosis: no Has patient had a PCN reaction that required hospitalization: no Has patient had a PCN reaction occurring within the last 10 years: {no If all of the above answers  are "NO", then may proceed with Cephalosporin use.    Medications Prior to Admission  Medication Sig Dispense Refill  . acetaminophen (TYLENOL ARTHRITIS PAIN) 650 MG CR tablet Take 1,300 mg by mouth every 8 (eight) hours.     Marland Kitchen atorvastatin (LIPITOR) 10 MG tablet Take 1 tablet (10 mg total) by mouth daily. 90 tablet 3  . Biotin 5000 MCG TABS Take 5,000 mcg by mouth daily.    Marland Kitchen CALCIUM-MAGNESIUM-VITAMIN D PO Take 2 tablets by mouth daily.    . Cholecalciferol (VITAMIN D) 2000 units CAPS Take 2,000 Units by mouth daily.    . citalopram (CELEXA) 20 MG tablet TAKE 1 TABLET BY MOUTH EVERYDAY AT BEDTIME 90 tablet 3  . clopidogrel (PLAVIX) 75 MG tablet Take 1 tablet (75 mg total) by mouth daily. 90 tablet 3  . fish oil-omega-3 fatty acids 1000 MG capsule Take 1,000 mg by mouth 2 (two) times daily.     Marland Kitchen gabapentin (NEURONTIN) 300 MG capsule Take 600  mg by mouth 2 (two) times daily.    . iron polysaccharides (POLY-IRON 150) 150 MG capsule Take 1 capsule (150 mg total) by mouth 2 (two) times daily. 60 capsule prn  . levothyroxine (SYNTHROID, LEVOTHROID) 50 MCG tablet TAKE 1 TABLET (50 MCG TOTAL) BY MOUTH DAILY BEFORE BREAKFAST. 90 tablet 1  . Macitentan (OPSUMIT) 10 MG TABS Take 1 tablet (10 mg total) by mouth daily. 90 tablet 3  . Multiple Vitamin (MULTIVITAMIN WITH MINERALS) TABS tablet Take 1 tablet by mouth daily.    . OXYGEN Inhale 2-3 L into the lungs continuous.     . pantoprazole (PROTONIX) 40 MG tablet Take 1 tablet (40 mg total) by mouth 2 (two) times daily. 60 tablet 1  . potassium chloride (K-DUR) 10 MEQ tablet Take 1 tablet (10 mEq total) by mouth daily. 90 tablet 3  . predniSONE (DELTASONE) 5 MG tablet Take 5 mg by mouth daily with breakfast.    . Probiotic CAPS Take 1 capsule by mouth daily.    . sildenafil (REVATIO) 20 MG tablet Take 3 tablets (60 mg total) by mouth 3 (three) times daily. 810 tablet 1  . spironolactone (ALDACTONE) 25 MG tablet TAKE 1 TABLET BY MOUTH EVERY DAY 90 tablet  1  . torsemide (DEMADEX) 20 MG tablet Take 2 tablets (40 mg total) by mouth 2 (two) times daily. 360 tablet 3  . vitamin B-12 (CYANOCOBALAMIN) 1000 MCG tablet Take 1,000 mcg by mouth daily.      No results found for this or any previous visit (from the past 48 hour(s)).  No results found.   Pertinent items are noted in HPI.  There were no vitals taken for this visit.  General appearance: alert, cooperative and appears stated age Head: Normocephalic, without obvious abnormality Neck: no JVD Resp: clear to auscultation bilaterally Cardio: regular rate and rhythm, S1, S2 normal, no murmur, click, rub or gallop GI: soft, non-tender; bowel sounds normal; no masses,  no organomegaly Extremities: gangrene right middle finger Pulses: 2+ and symmetric Skin: Skin color, texture, turgor normal. No rashes or lesions Neurologic: Grossly normal Incision/Wound: Gangrene  Assessment/Plan Diagnosed scleroderma with gangrene and infection middle finger right hand  Plan: Ray resection right middle finger.  Pre-peri-and postoperative course of well-known to her.  This done in an effort to gain healing of the finger.  To prevent further infection and tissue loss to the hand.  Daryll Brod 11/21/2018, 1:28 PM

## 2018-11-21 NOTE — Op Note (Signed)
NAME: [name] [name] MEDICAL RECORD NO: [id number] DATE OF BIRTH: [birth year] FAC[name] LOCATION: [facility] PHYSICIAN: Wynonia Sours, MD   OPERATIVE REPORT   DATE OF PROCEDURE: 11/21/18    PREOPERATIVE DIAGNOSIS:   Thyroid normal with gangrene right middle finger infection   POSTOPERATIVE DIAGNOSIS:   Same   PROCEDURE:   Ray resection right middle finger   SURGEON: Daryll Brod, M.D.   ASSISTANT: Leanora Cover, MD   ANESTHESIA:  Regional with sedation and Local   INTRAVENOUS FLUIDS:  Per anesthesia flow sheet.   ESTIMATED BLOOD LOSS:  Minimal.   COMPLICATIONS:  None.   SPECIMENS:   Finger   TOURNIQUET TIME:    Total Tourniquet Time Documented: Upper Arm (Right) - 40 minutes Total: Upper Arm (Right) - 40 minutes    DISPOSITION:  Stable to PACU.   INDICATIONS: [name] [name] is a 67 year old female with history of scleroderma she has had a ray resection to her index finger for vascular compromise.  She has undergone a little sympathectomy in the past.  She has exposed bone and middle ring and small finger.  The middle finger has become grossly swollen more erythematous and markedly painful over the past day.  Is admitted for amputation of the right knee.  She is aware of risks and complications including further loss.  The possibility of more proximal infection.  Injuries to arteries nerves tendons the operative area the patient is seen extremity marked by both patient and surgeon antibiotic has been given she has been on doxycycline.  OPERATIVE COURSE: After a supraclavicular block was carried out without difficulty in the preoperative area the patient was brought to the operating room where she was placed in the supine position prepped with ChloraPrep with the right arm free.  A three-minute dry time was allowed timeout taken to confirm patient procedure.  The limb was exsanguinated from the wrist proximally with an Esmarch bandage turn placed on the upper  arm inflated 250 mmHg.  An incision was made including incorporating her old incision for the index ray resection carried around the proximal phalanx just distal to the webspace of the middle finger carried down through subcutaneous tissue bleeders were meticulously cauterized with bipolar.  Dissection was carried to finding the digital nerves bilaterally.  These were ligated and allowed to retract after traction.  The digital artery on the radial side was entirely clotted.  The ulnar digital artery was present but was of questionable patency.  This was cauterized and tied with a 4-0 Vicryl suture.  The extensors and flexor tendons were transected proximally cultures were taken from the flexor sheath for both aerobic and anaerobic cultures intrinsics were dissected free from the metacarpal the metacarpal was then transected obliquely just distal to the index metacarpal transection.  The entire ray was sent to pathology.  The wound was copiously irrigated with saline.  A double over vessel loop drain was placed to the depths of the wound.  Wound was then closed with interrupted 4-0 nylon sutures excising excess skin.  Sterile compressive dressing with a splint in the webspace was applied.  Inflation of the tourniquet the tourniquet the remaining digits pink.  She was taken to the recovery room for observation in satisfactory condition.  She will be discharged home to return to [facility] [place] next week on Norco and doxycycline which she has been on   Daryll Brod, MD Electronically signed, 11/21/18

## 2018-11-21 NOTE — Progress Notes (Signed)
Assisted Dr. Ambrose Pancoast with right, ultrasound guided, axillary block. Side rails up, monitors on throughout procedure. See vital signs in flow sheet. Tolerated Procedure well.

## 2018-11-21 NOTE — Transfer of Care (Signed)
Immediate Anesthesia Transfer of Care Note  Patient: [name] [name]  Procedure(s) Performed: AMPUTATION RAY RIGHT MIDDLE FINGER (Right )  Patient Location: PACU  Anesthesia Type:MAC and Regional  Level of Consciousness: awake, alert  and oriented  Airway & Oxygen Therapy: Patient Spontanous Breathing and Patient connected to face mask oxygen  Post-op Assessment: Report given to RN and Post -op Vital signs reviewed and stable  Post vital signs: Reviewed and stable  Last Vitals:  Vitals Value Taken Time  BP    Temp    Pulse 62 11/21/2018  3:54 PM  Resp 19 11/21/2018  3:54 PM  SpO2 94 % 11/21/2018  3:54 PM  Vitals shown include unvalidated device data.  Last Pain:  Vitals:   11/21/18 1330  TempSrc: Oral  PainSc: 5       Patients Stated Pain Goal: 2 (96/75/91 [id number])  Complications: No apparent anesthesia complications

## 2018-11-21 NOTE — Discharge Instructions (Addendum)
[facility] Center Instructions Hand Surgery  Wound Care: Keep your hand elevated above the level of your heart.  Do not allow it to dangle by your side.  Keep the dressing dry and do not remove it unless your doctor advises you to do so.  He will usually change it at the time of your post-op visit.  Moving your fingers is advised to stimulate circulation but will depend on the site of your surgery.  If you have a splint applied, your doctor will advise you regarding movement.  Activity: Do not drive or operate machinery today.  Rest today and then you may return to your normal activity and work as indicated by your physician.  Diet:  Drink liquids today or eat a light diet.  You may resume a regular diet tomorrow.    General expectations: Pain for two to three days. Fingers may become slightly swollen.  Call your doctor if any of the following occur: Severe pain not relieved by pain medication. Elevated temperature. Dressing soaked with blood. Inability to move fingers. White or bluish color to fingers.   Post Anesthesia Home Care Instructions  Activity: Get plenty of rest for the remainder of the day. A responsible individual must stay with you for 24 hours following the procedure.  For the next 24 hours, DO NOT: -Drive a car -Paediatric nurse -Drink alcoholic beverages -Take any medication unless instructed by your physician -Make any legal decisions or sign important papers.  Meals: Start with liquid foods such as gelatin or soup. Progress to regular foods as tolerated. Avoid greasy, spicy, heavy foods. If nausea and/or vomiting occur, drink only clear liquids until the nausea and/or vomiting subsides. Call your physician if vomiting continues.  Special Instructions/Symptoms: Your throat may feel dry or sore from the anesthesia or the breathing tube placed in your throat during surgery. If this causes discomfort, gargle with warm salt water. The discomfort should disappear within  24 hours.  If you had a scopolamine patch placed behind your ear for the management of post- operative nausea and/or vomiting:  1. The medication in the patch is effective for 72 hours, after which it should be removed.  Wrap patch in a tissue and discard in the trash. Wash hands thoroughly with soap and water. 2. You may remove the patch earlier than 72 hours if you experience unpleasant side effects which may include dry mouth, dizziness or visual disturbances. 3. Avoid touching the patch. Wash your hands with soap and water after contact with the patch.  Regional Anesthesia Blocks  1. Numbness or the inability to move the "blocked" extremity may last from 3-48 hours after placement. The length of time depends on the medication injected and your individual response to the medication. If the numbness is not going away after 48 hours, call your surgeon.  2. The extremity that is blocked will need to be protected until the numbness is gone and the  Strength has returned. Because you cannot feel it, you will need to take extra care to avoid injury. Because it may be weak, you may have difficulty moving it or using it. You may not know what position it is in without looking at it while the block is in effect.  3. For blocks in the legs and feet, returning to weight bearing and walking needs to be done carefully. You will need to wait until the numbness is entirely gone and the strength has returned. You should be able to move your leg and foot  normally before you try and bear weight or walk. You will need someone to be with you when you first try to ensure you do not fall and possibly risk injury.  4. Bruising and tenderness at the needle site are common side effects and will resolve in a few days.  5. Persistent numbness or new problems with movement should be communicated to the surgeon or the [place] [phone] [place] [phone]).

## 2018-11-21 NOTE — Anesthesia Postprocedure Evaluation (Signed)
Anesthesia Post Note  Patient: [name] [name]  Procedure(s) Performed: AMPUTATION RAY RIGHT MIDDLE FINGER (Right )     Patient location during evaluation: PACU Anesthesia Type: MAC Level of consciousness: awake and alert Pain management: pain level controlled Vital Signs Assessment: post-procedure vital signs reviewed and stable Respiratory status: spontaneous breathing, nonlabored ventilation, respiratory function stable and patient connected to nasal cannula oxygen Cardiovascular status: stable and blood pressure returned to baseline Postop Assessment: no apparent nausea or vomiting Anesthetic complications: no    Last Vitals:  Vitals:   11/21/18 1553 11/21/18 1600  BP: (!) 101/52 (!) 105/51  Pulse: 63 62  Resp: 18 18  Temp: 37 C   SpO2: 98% 97%    Last Pain:  Vitals:   11/21/18 1553  TempSrc:   PainSc: 0-No pain                 Dhyana Bastone

## 2018-11-21 NOTE — Anesthesia Preprocedure Evaluation (Signed)
Anesthesia Evaluation  Patient identified by MRN, date of birth, ID band Patient awake    Reviewed: Allergy & Precautions, NPO status   Airway Mallampati: II  TM Distance: >3 FB Neck ROM: Full    Dental   Pulmonary shortness of breath,    breath sounds clear to auscultation       Cardiovascular hypertension, + Peripheral Vascular Disease and + DOE   Rhythm:Regular Rate:Normal     Neuro/Psych    GI/Hepatic Neg liver ROS, GI history noted. CE   Endo/Other  negative endocrine ROS  Renal/GU negative Renal ROS     Musculoskeletal   Abdominal   Peds  Hematology  (+) Blood dyscrasia, anemia ,   Anesthesia Other Findings   Reproductive/Obstetrics                             Anesthesia Physical  Anesthesia Plan  ASA: III  Anesthesia Plan: MAC   Post-op Pain Management:    Induction: Intravenous  PONV Risk Score and Plan:   Airway Management Planned: Simple Face Mask  Additional Equipment:   Intra-op Plan:   Post-operative Plan:   Informed Consent: I have reviewed the patients History and Physical, chart, labs and discussed the procedure including the risks, benefits and alternatives for the proposed anesthesia with the patient or authorized representative who has indicated his/her understanding and acceptance.   Dental advisory given  Plan Discussed with: CRNA and Anesthesiologist  Anesthesia Plan Comments:         Anesthesia Quick Evaluation

## 2018-11-21 NOTE — Anesthesia Procedure Notes (Signed)
Anesthesia Regional Block: Axillary brachial plexus block   Pre-Anesthetic Checklist: ,, timeout performed, Correct Patient, Correct Site, Correct Laterality, Correct Procedure, Correct Position, site marked, Risks and benefits discussed,  Surgical consent,  Pre-op evaluation,  At surgeon's request and post-op pain management  Laterality: Right  Prep: chloraprep       Needles:  Injection technique: Single-shot  Needle Type: Echogenic Stimulator Needle     Needle Length: 5cm  Needle Gauge: 22     Additional Needles:   Procedures:, nerve stimulator,,, ultrasound used (permanent image in chart),,,,   Nerve Stimulator or Paresthesia:  Response: hand, 0.45 mA,   Additional Responses:   Narrative:  Start time: 11/21/2018 2:07 PM End time: 11/21/2018 2:15 PM Injection made incrementally with aspirations every 5 mL.  Performed by: Personally  Anesthesiologist: Janeece Riggers, MD  Additional Notes: Functioning IV was confirmed and monitors were applied.  A 17m 22ga Arrow echogenic stimulator needle was used. Sterile prep and drape,hand hygiene and sterile gloves were used. Ultrasound guidance: relevant anatomy identified, needle position confirmed, local anesthetic spread visualized around nerve(s)., vascular puncture avoided.  Image printed for medical record. Negative aspiration and negative test dose prior to incremental administration of local anesthetic. The patient tolerated the procedure well.

## 2018-11-21 NOTE — Brief Op Note (Signed)
11/21/2018  3:53 PM  PATIENT:  [name] [name]  67 y.o. female  PRE-OPERATIVE DIAGNOSIS:  NECROTIC RIGHT MIDDLE FINGER  POST-OPERATIVE DIAGNOSIS:  NECROTIC RIGHT MIDDLE FINGER  PROCEDURE:  Procedure(s): AMPUTATION RAY RIGHT MIDDLE FINGER (Right)  SURGEON:  Surgeon(s) and Role:    * Daryll Brod, MD - Primary  PHYSICIAN ASSISTANT:   ASSISTANTS: K Melvie Paglia,MD   ANESTHESIA:   local and regional  EBL:  4m  BLOOD ADMINISTERED:none  DRAINS: Penrose drain in the hand   LOCAL MEDICATIONS USED:  BUPIVICAINE   SPECIMEN:  Excision  DISPOSITION OF SPECIMEN:  PATHOLOGY  COUNTS:  YES  TOURNIQUET:   Total Tourniquet Time Documented: Upper Arm (Right) - 40 minutes Total: Upper Arm (Right) - 40 minutes   DICTATION: .DViviann SpareDictation  PLAN OF CARE: Discharge to home after PACU  PATIENT DISPOSITION:  PACU - hemodynamically stable.

## 2018-11-24 ENCOUNTER — Encounter (HOSPITAL_BASED_OUTPATIENT_CLINIC_OR_DEPARTMENT_OTHER): Payer: Self-pay | Admitting: Orthopedic Surgery

## 2018-11-24 NOTE — Addendum Note (Signed)
Addendum  created 11/24/18 1241 by Tawni Millers, CRNA   Charge Capture section accepted

## 2018-11-26 ENCOUNTER — Other Ambulatory Visit: Payer: Self-pay | Admitting: Internal Medicine

## 2018-11-26 DIAGNOSIS — E7849 Other hyperlipidemia: Secondary | ICD-10-CM

## 2018-11-26 DIAGNOSIS — M349 Systemic sclerosis, unspecified: Secondary | ICD-10-CM

## 2018-11-26 DIAGNOSIS — E039 Hypothyroidism, unspecified: Secondary | ICD-10-CM

## 2018-11-26 DIAGNOSIS — I50812 Chronic right heart failure: Secondary | ICD-10-CM

## 2018-11-26 DIAGNOSIS — Z7901 Long term (current) use of anticoagulants: Secondary | ICD-10-CM

## 2018-11-26 DIAGNOSIS — I10 Essential (primary) hypertension: Secondary | ICD-10-CM

## 2018-11-26 DIAGNOSIS — I739 Peripheral vascular disease, unspecified: Secondary | ICD-10-CM

## 2018-11-26 DIAGNOSIS — Z0000 Encounter for general adult medical examination without abnormal findings: Secondary | ICD-10-CM

## 2018-11-27 LAB — AEROBIC/ANAEROBIC CULTURE W GRAM STAIN (SURGICAL/DEEP WOUND): Culture: NO GROWTH

## 2018-12-01 DIAGNOSIS — Z882 Allergy status to sulfonamides status: Secondary | ICD-10-CM | POA: Diagnosis not present

## 2018-12-01 DIAGNOSIS — Z89021 Acquired absence of right finger(s): Secondary | ICD-10-CM | POA: Diagnosis not present

## 2018-12-01 DIAGNOSIS — Z881 Allergy status to other antibiotic agents status: Secondary | ICD-10-CM | POA: Diagnosis not present

## 2018-12-01 DIAGNOSIS — I96 Gangrene, not elsewhere classified: Secondary | ICD-10-CM | POA: Diagnosis not present

## 2018-12-01 DIAGNOSIS — Z880 Allergy status to penicillin: Secondary | ICD-10-CM | POA: Diagnosis not present

## 2018-12-01 DIAGNOSIS — M349 Systemic sclerosis, unspecified: Secondary | ICD-10-CM | POA: Diagnosis not present

## 2018-12-02 ENCOUNTER — Other Ambulatory Visit: Payer: Medicare Other | Admitting: Internal Medicine

## 2018-12-02 DIAGNOSIS — Z7901 Long term (current) use of anticoagulants: Secondary | ICD-10-CM | POA: Diagnosis not present

## 2018-12-02 DIAGNOSIS — I739 Peripheral vascular disease, unspecified: Secondary | ICD-10-CM | POA: Diagnosis not present

## 2018-12-02 DIAGNOSIS — M349 Systemic sclerosis, unspecified: Secondary | ICD-10-CM | POA: Diagnosis not present

## 2018-12-02 DIAGNOSIS — E7849 Other hyperlipidemia: Secondary | ICD-10-CM

## 2018-12-02 DIAGNOSIS — I50812 Chronic right heart failure: Secondary | ICD-10-CM | POA: Diagnosis not present

## 2018-12-02 DIAGNOSIS — I10 Essential (primary) hypertension: Secondary | ICD-10-CM | POA: Diagnosis not present

## 2018-12-02 DIAGNOSIS — E039 Hypothyroidism, unspecified: Secondary | ICD-10-CM

## 2018-12-02 DIAGNOSIS — Z0000 Encounter for general adult medical examination without abnormal findings: Secondary | ICD-10-CM | POA: Diagnosis not present

## 2018-12-03 LAB — CBC WITH DIFFERENTIAL/PLATELET
Basophils Absolute: 31 cells/uL (ref 0–200)
Basophils Relative: 0.6 %
Eosinophils Absolute: 62 cells/uL (ref 15–500)
Eosinophils Relative: 1.2 %
HCT: 29.9 % — ABNORMAL LOW (ref 35.0–45.0)
Hemoglobin: 10.1 g/dL — ABNORMAL LOW (ref 11.7–15.5)
Lymphs Abs: 1076 cells/uL (ref 850–3900)
MCH: 33.1 pg — ABNORMAL HIGH (ref 27.0–33.0)
MCHC: 33.8 g/dL (ref 32.0–36.0)
MCV: 98 fL (ref 80.0–100.0)
MPV: 9.2 fL (ref 7.5–12.5)
Monocytes Relative: 10.6 %
Neutro Abs: 3479 cells/uL (ref 1500–7800)
Neutrophils Relative %: 66.9 %
Platelets: 348 10*3/uL (ref 140–400)
RBC: 3.05 10*6/uL — ABNORMAL LOW (ref 3.80–5.10)
RDW: 11.9 % (ref 11.0–15.0)
Total Lymphocyte: 20.7 %
WBC mixed population: 551 cells/uL (ref 200–950)
WBC: 5.2 10*3/uL (ref 3.8–10.8)

## 2018-12-03 LAB — LIPID PANEL
Cholesterol: 177 mg/dL (ref <200)
HDL: 48 mg/dL — ABNORMAL LOW (ref >50)
LDL Cholesterol (Calc): 102 mg/dL (calc) — ABNORMAL HIGH
Non-HDL Cholesterol (Calc): 129 mg/dL (calc) (ref <130)
Total CHOL/HDL Ratio: 3.7 (calc) (ref <5.0)
Triglycerides: 162 mg/dL — ABNORMAL HIGH (ref <150)

## 2018-12-03 LAB — COMPLETE METABOLIC PANEL WITH GFR
AG Ratio: 1.4 (calc) (ref 1.0–2.5)
ALT: 11 U/L (ref 6–29)
AST: 22 U/L (ref 10–35)
Albumin: 3.7 g/dL (ref 3.6–5.1)
Alkaline phosphatase (APISO): 48 U/L (ref 33–130)
BUN/Creatinine Ratio: 29 (calc) — ABNORMAL HIGH (ref 6–22)
BUN: 34 mg/dL — ABNORMAL HIGH (ref 7–25)
CO2: 29 mmol/L (ref 20–32)
Calcium: 7.2 mg/dL — ABNORMAL LOW (ref 8.6–10.4)
Chloride: 100 mmol/L (ref 98–110)
Creat: 1.17 mg/dL — ABNORMAL HIGH (ref 0.50–0.99)
GFR, Est African American: 55 mL/min/{1.73_m2} — ABNORMAL LOW (ref >=60)
GFR, Est Non African American: 48 mL/min/{1.73_m2} — ABNORMAL LOW (ref >=60)
Globulin: 2.6 g/dL (calc) (ref 1.9–3.7)
Glucose, Bld: 79 mg/dL (ref 65–99)
Potassium: 3.4 mmol/L — ABNORMAL LOW (ref 3.5–5.3)
Sodium: 141 mmol/L (ref 135–146)
Total Bilirubin: 0.4 mg/dL (ref 0.2–1.2)
Total Protein: 6.3 g/dL (ref 6.1–8.1)

## 2018-12-03 LAB — TSH: TSH: 4.33 mIU/L (ref 0.40–4.50)

## 2018-12-05 ENCOUNTER — Ambulatory Visit (INDEPENDENT_AMBULATORY_CARE_PROVIDER_SITE_OTHER): Payer: Medicare Other | Admitting: Internal Medicine

## 2018-12-05 ENCOUNTER — Encounter: Payer: Self-pay | Admitting: Internal Medicine

## 2018-12-05 VITALS — BP 120/60 | HR 76 | Ht 67.0 in | Wt 143.0 lb

## 2018-12-05 DIAGNOSIS — Z9981 Dependence on supplemental oxygen: Secondary | ICD-10-CM | POA: Diagnosis not present

## 2018-12-05 DIAGNOSIS — E039 Hypothyroidism, unspecified: Secondary | ICD-10-CM

## 2018-12-05 DIAGNOSIS — I2729 Other secondary pulmonary hypertension: Secondary | ICD-10-CM

## 2018-12-05 DIAGNOSIS — M810 Age-related osteoporosis without current pathological fracture: Secondary | ICD-10-CM | POA: Diagnosis not present

## 2018-12-05 DIAGNOSIS — E7849 Other hyperlipidemia: Secondary | ICD-10-CM | POA: Diagnosis not present

## 2018-12-05 DIAGNOSIS — I739 Peripheral vascular disease, unspecified: Secondary | ICD-10-CM

## 2018-12-05 DIAGNOSIS — Z8572 Personal history of non-Hodgkin lymphomas: Secondary | ICD-10-CM

## 2018-12-05 DIAGNOSIS — Z7901 Long term (current) use of anticoagulants: Secondary | ICD-10-CM

## 2018-12-05 DIAGNOSIS — E876 Hypokalemia: Secondary | ICD-10-CM | POA: Diagnosis not present

## 2018-12-05 DIAGNOSIS — M349 Systemic sclerosis, unspecified: Secondary | ICD-10-CM

## 2018-12-05 DIAGNOSIS — I779 Disorder of arteries and arterioles, unspecified: Secondary | ICD-10-CM

## 2018-12-05 DIAGNOSIS — I10 Essential (primary) hypertension: Secondary | ICD-10-CM | POA: Diagnosis not present

## 2018-12-05 DIAGNOSIS — H903 Sensorineural hearing loss, bilateral: Secondary | ICD-10-CM

## 2018-12-05 DIAGNOSIS — Z0000 Encounter for general adult medical examination without abnormal findings: Secondary | ICD-10-CM | POA: Diagnosis not present

## 2018-12-05 DIAGNOSIS — R8290 Unspecified abnormal findings in urine: Secondary | ICD-10-CM | POA: Diagnosis not present

## 2018-12-05 DIAGNOSIS — Z8719 Personal history of other diseases of the digestive system: Secondary | ICD-10-CM

## 2018-12-05 LAB — POCT URINALYSIS DIPSTICK
Bilirubin, UA: NEGATIVE
Blood, UA: NEGATIVE
Glucose, UA: NEGATIVE
Ketones, UA: NEGATIVE
Nitrite, UA: POSITIVE
Protein, UA: NEGATIVE
Spec Grav, UA: 1.01 (ref 1.010–1.025)
Urobilinogen, UA: 0.2 E.U./dL
pH, UA: 6 (ref 5.0–8.0)

## 2018-12-05 MED ORDER — POTASSIUM CHLORIDE CRYS ER 20 MEQ PO TBCR: 20.0000 meq | EXTENDED_RELEASE_TABLET | Freq: Every day | ORAL | 3 refills | Status: DC

## 2018-12-05 MED ORDER — DENOSUMAB 60 MG/ML ~~LOC~~ SOSY
60.0000 mg | PREFILLED_SYRINGE | Freq: Once | SUBCUTANEOUS | Status: AC
  Administered 2018-12-05: 60 mg via SUBCUTANEOUS

## 2018-12-05 NOTE — Patient Instructions (Addendum)
We will need to adjust potassium supplementation.  Urine culture sent with further instructions to follow.  We will need to follow-up on low calcium and elevated TSH at the end of January.  Prolia injection given today

## 2018-12-05 NOTE — Progress Notes (Signed)
Subjective:    [name], female    DOB: 08-15-1951, 67 y.o.   MRN: [id number]  HPI 67 year old Female presents for Medicare wellness, health maintenance exam and evaluation of medical issues including chronic kidney disease stage III, hypertension, depression, severe venous stasis ulceration right leg with arterial insufficiency, history of scleroderma.  History of non-Hodgkin's lymphoma treated with chemotherapy in 2010 and felt to be cured.  History of melanoma.  Was hospitalized for GI hemorrhage May 2017.  Upper endoscopy showed a possible fungal esophagitis treated with Diflucan by Dr. Oletta Lamas.  Colonoscopy revealed some rectal polyps that were removed.  No obvious source of bleeding.  Aspirin and Plavix were held for several days and she was started on PPI.  Followed by Dr. Fredna Dow for issues with fingers.  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  Had partial amputation of right third finger around 2007.  Has been scheduled to see Dr. Gershon Mussel for the possibility of Botox injections for arterial insufficiency.  She has chronic doxycycline therapy.  In November had gangrene and infection of right third finger requiring ray resection by Dr. Fredna Dow.  Can take doxycycline capsules but not tablets  Rheumatologist is Dr. Amil Amen.  Dr. Jeffie Pollock is cardiologist.  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  Right femoral endarterectomy by Dr. [name] September 2017.  She also saw healing for extensive right leg venous ulceration in August 2017 and prior to that had ablation of saphenous vein.  History of severe venous hypertension.  History of arterial insufficiency in [place] with bilateral lower extremity standing a number of years ago.  History of non-Hodgkin's lymphoma primary being treated in the CNS with methotrexate and rituximab at [facility] in [place] 2010.  Felt to be cured.  Chronic leg ulcer has been treated at wound  care center by Dr. Quentin Cornwall.  Tonsillectomy and adenoidectomy 1966.  Right fifth toe removed 1999.  History of basal cell carcinoma right lower extremity.  LASEK eye surgery 2001.  Old records indicates she had 2D echocardiogram in [place] in 2013 showing severe right ventricular enlargement and moderate right ventricular dysfunction.  Severe regurgitation of the tricuspid valve.  Severe pulmonary hypertension with estimated RVSP 115 to 120 mmHg.  Diastolic dysfunction and a small underfilled left ventricular cavity with hyperdynamic function.  History of hyperlipidemia treated with Lipitor.  Is had biopsy on cerebellum in the past to diagnose lymphoma.  Social history: She denied smoking.  Has 1 alcoholic drink in the evening.  Married.  Daughter is deceased from cancer.  Husband until recently was employed by [employer].  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  One granddaughter.  She is allergic to penicillin-causes a rash.  Sulfa antibiotics caused her face to peel with a severe rash.  Levaquin causes flulike symptoms.  Doxycycline is caused swelling if she takes tablets but capsules are tolerated fine.  Vein ligation and stripping May 2013 including the debridement right lower leg by Dr. [name].  Endovenous ablation saphenous vein with laser July 2013 and mildly right greater saphenous vein, endovenous ablation saphenous vein with laser left greater saphenous vein by Dr. [name] October 2013.  Sees Dr. Lamonte Sakai for pulmonary hypertension related to scleroderma.  Dr. Oletta Lamas is her gastroenterologist.  Oncologist is Dr. Gustavo Lah at [facility] in [place]  Diagnosed with hypothyroidism 2018.  History of chronic kidney disease stage III that is stable.  Chronic  respiratory failure and interstitial lung disease requiring constant oxygen.  Is on chronic anticoagulation with Plavix and aspirin.  History of pneumonia in the remote past.   History of allergic rhinitis.  Family history: Father with history of lupus and non-Hodgkin's lymphoma involving the thoracic area and died of lymphoma at age 45.  Mother with history of hyperlipidemia died of reported atherosclerosis.  Daughter with history of sarcoma deceased at age 32.      Review of Systems  Constitutional: Positive for fatigue.  Respiratory: Positive for shortness of breath.   Cardiovascular: Negative for chest pain.  Gastrointestinal: Negative.   Genitourinary: Negative.    chronic fatigue, depression     Objective:   Physical Exam Constitutional:      Appearance: Normal appearance. She is not toxic-appearing or diaphoretic.  HENT:     Head: Normocephalic and atraumatic.     Right Ear: Tympanic membrane normal.     Left Ear: Tympanic membrane normal.     Mouth/Throat:     Mouth: Mucous membranes are moist.  Eyes:     Pupils: Pupils are equal, round, and reactive to light.  Cardiovascular:     Rate and Rhythm: Normal rate and regular rhythm.     Heart sounds: No murmur.  Pulmonary:     Effort: Pulmonary effort is normal. No respiratory distress.     Breath sounds: Normal breath sounds. No wheezing.  Abdominal:     General: Bowel sounds are normal.     Palpations: Abdomen is soft. There is no mass.     Tenderness: There is no rebound.  Musculoskeletal:        General: No swelling.     Right lower leg: No edema.     Left lower leg: No edema.  Lymphadenopathy:     Cervical: No cervical adenopathy.  Skin:    General: Skin is warm and dry.  Neurological:     Mental Status: She is alert.           Assessment & Plan:  History of scleroderma seen by Dr. Amil Amen  Severe pulmonary hypertension oxygen dependent  Peripheral vascular disease-has had procedures by Dr. [name]  Essential hypertension  Hyperlipidemia  Chronic anticoagulation  Chronic kidney disease  History of depression  History of foot ulcer  History of amputation/ray  resection right index finger and right third finger  Osteoporosis  History of GI bleed on anticoagulation without obvious source and treated with PPI  History of lymphoma in the remote past treated in [place] affecting the brain  Hearing loss-does not want hearing aids  Plan: Urinalysis is abnormal and culture will be sent.  Has mild elevation of triglycerides at 162.  Is anemic with hemoglobin 10.1 g MCV is normal.  This may be related to recent surgery.  In June hemoglobin was 11.6 g.  TSH 4.33 and will continue to monitor.  Creatinine stable at 1.17.  Potassium is low at 3.4 and potassium supplement will be increased with follow-up scheduled.  Calcium is low at 7.2 with low normal total protein and albumin.  Calcium was normal in 2018.  Will need to repeat soon.  Subjective:   Patient presents for Medicare Annual/Subsequent preventive examination.  Review Past Medical/Family/Social: See above   Risk Factors  Current exercise habits: Sedentary Dietary issues discussed: Low-fat low carbohydrate  Cardiac risk factors: Hyperlipidemia  Depression Screen  (Note: if answer to either of the following is "Yes", a more complete depression screening is indicated)   Over  the past two weeks, have you felt down, depressed or hopeless?  Sometimes Over the past two weeks, have you felt little interest or pleasure in doing things? No Have you lost interest or pleasure in daily life? No Do you often feel hopeless? No Do you cry easily over simple problems? No   Activities of Daily Living  In your present state of health, do you have any difficulty performing the following activities?:   Driving? No  Managing money? No  Feeding yourself? No  Getting from bed to chair? No  Climbing a flight of stairs?  Yes due to shortness of breath Preparing food and eating?: No  Bathing or showering? No  Getting dressed: No  Getting to the toilet? No  Using the toilet:No  Moving around from place to  place: Yes due to shortness of breath In the past year have you fallen or had a near fall?:No  Are you sexually active?  Yes Do you have more than one partner? No   Hearing Difficulties: No  Do you often ask people to speak up or repeat themselves?  Yes does not want hearing aids Do you experience ringing or noises in your ears? No  Do you have difficulty understanding soft or whispered voices?  Yes Do you feel that you have a problem with memory? No Do you often misplace items? No    Home Safety:  Do you have a smoke alarm at your residence? Yes Do you have grab bars in the bathroom? Do you have throw rugs in your house?   Cognitive Testing  Alert? Yes Normal Appearance?Yes  Oriented to person? Yes Place? Yes  Time? Yes  Recall of three objects?  Not tested Can perform simple calculations?  Not tested Displays appropriate judgment?Yes  Can read the correct time from a watch face?Yes   List the Names of Other Physician/Practitioners you currently use:  See referral list for the physicians patient is currently seeing.     Review of Systems: See above   Objective:     General appearance: Appears stated age and thin Head: Normocephalic, without obvious abnormality, atraumatic  Eyes: conj clear, EOMi PEERLA  Ears: normal TM's and external ear canals both ears  Nose: Nares normal. Septum midline. Mucosa normal. No drainage or sinus tenderness.  Throat: lips, mucosa, and tongue normal; teeth and gums normal  Neck: no adenopathy, no carotid bruit, no JVD, supple, symmetrical, trachea midline and thyroid not enlarged, symmetric, no tenderness/mass/nodules  No CVA tenderness.  Lungs: clear to auscultation bilaterally  Breasts: normal appearance, no masses or tenderness Heart: regular rate and rhythm, S1, S2 normal, no murmur, click, rub or gallop  Abdomen: soft, non-tender; bowel sounds normal; no masses, no organomegaly  Musculoskeletal: ROM normal in all joints, no  crepitus, no deformity, Normal muscle strengthen. Back  is symmetric, no curvature. Skin: Skin color, texture, turgor normal. No rashes or lesions  Lymph nodes: Cervical, supraclavicular, and axillary nodes normal.  Neurologic: CN 2 -12 Normal, Normal symmetric reflexes. Normal coordination and gait  Psych: Alert & Oriented x 3, Mood appear stable.    Assessment:    Annual wellness medicare exam   Plan:    During the course of the visit the patient was educated and counseled about appropriate screening and preventive services including:   Annual mammogram  Annual flu vaccine     Patient Instructions (the written plan) was given to the patient.  Medicare Attestation  I have personally reviewed:  The patient's  medical and social history  Their use of alcohol, tobacco or illicit drugs  Their current medications and supplements  The patient's functional ability including ADLs,fall risks, home safety risks, cognitive, and hearing and visual impairment  Diet and physical activities  Evidence for depression or mood disorders  The patient's weight, height, BMI, and visual acuity have been recorded in the chart. I have made referrals, counseling, and provided education to the patient based on review of the above and I have provided the patient with a written personalized care plan for preventive services.

## 2018-12-07 LAB — URINE CULTURE
MICRO NUMBER:: 91463756
SPECIMEN QUALITY:: ADEQUATE

## 2018-12-08 ENCOUNTER — Ambulatory Visit (INDEPENDENT_AMBULATORY_CARE_PROVIDER_SITE_OTHER): Payer: Medicare Other | Admitting: Internal Medicine

## 2018-12-08 ENCOUNTER — Encounter (HOSPITAL_BASED_OUTPATIENT_CLINIC_OR_DEPARTMENT_OTHER): Payer: Medicare Other | Attending: Internal Medicine

## 2018-12-08 VITALS — BP 110/60 | HR 60 | Temp 98.1°F

## 2018-12-08 DIAGNOSIS — I779 Disorder of arteries and arterioles, unspecified: Secondary | ICD-10-CM | POA: Diagnosis not present

## 2018-12-08 DIAGNOSIS — L97812 Non-pressure chronic ulcer of other part of right lower leg with fat layer exposed: Secondary | ICD-10-CM | POA: Insufficient documentation

## 2018-12-08 DIAGNOSIS — L97312 Non-pressure chronic ulcer of right ankle with fat layer exposed: Secondary | ICD-10-CM | POA: Diagnosis not present

## 2018-12-08 DIAGNOSIS — I10 Essential (primary) hypertension: Secondary | ICD-10-CM | POA: Insufficient documentation

## 2018-12-08 DIAGNOSIS — I87331 Chronic venous hypertension (idiopathic) with ulcer and inflammation of right lower extremity: Secondary | ICD-10-CM | POA: Diagnosis not present

## 2018-12-08 DIAGNOSIS — N390 Urinary tract infection, site not specified: Secondary | ICD-10-CM | POA: Diagnosis not present

## 2018-12-08 DIAGNOSIS — I87311 Chronic venous hypertension (idiopathic) with ulcer of right lower extremity: Secondary | ICD-10-CM | POA: Diagnosis not present

## 2018-12-08 DIAGNOSIS — L97212 Non-pressure chronic ulcer of right calf with fat layer exposed: Secondary | ICD-10-CM | POA: Insufficient documentation

## 2018-12-08 MED ORDER — CEFTRIAXONE SODIUM 1 G IJ SOLR
1.0000 g | Freq: Once | INTRAMUSCULAR | Status: AC
  Administered 2018-12-08: 1 g via INTRAMUSCULAR

## 2018-12-08 NOTE — Progress Notes (Signed)
Pt was here Friday for CPE and has Klebsiella UTI. Has multiple drug intolerances. Treat with IM Rocephin daily x 3 days.

## 2018-12-08 NOTE — Patient Instructions (Addendum)
Rocephin one gram IM given. RTC tomorrow.

## 2018-12-09 ENCOUNTER — Ambulatory Visit (INDEPENDENT_AMBULATORY_CARE_PROVIDER_SITE_OTHER): Payer: Medicare Other | Admitting: Internal Medicine

## 2018-12-09 DIAGNOSIS — N390 Urinary tract infection, site not specified: Secondary | ICD-10-CM | POA: Diagnosis not present

## 2018-12-09 MED ORDER — CEFTRIAXONE SODIUM 1 G IJ SOLR
1.0000 g | Freq: Once | INTRAMUSCULAR | Status: AC
  Administered 2018-12-09: 1 g via INTRAMUSCULAR

## 2018-12-09 NOTE — Progress Notes (Signed)
One gram Rocephin given IM. This is second of 3 injections.

## 2018-12-10 ENCOUNTER — Ambulatory Visit (INDEPENDENT_AMBULATORY_CARE_PROVIDER_SITE_OTHER): Payer: Medicare Other | Admitting: Internal Medicine

## 2018-12-10 VITALS — BP 130/70 | HR 60 | Temp 98.1°F

## 2018-12-10 DIAGNOSIS — I779 Disorder of arteries and arterioles, unspecified: Secondary | ICD-10-CM | POA: Diagnosis not present

## 2018-12-10 DIAGNOSIS — R8290 Unspecified abnormal findings in urine: Secondary | ICD-10-CM

## 2018-12-10 DIAGNOSIS — N390 Urinary tract infection, site not specified: Secondary | ICD-10-CM | POA: Diagnosis not present

## 2018-12-10 LAB — POCT URINALYSIS DIPSTICK
Appearance: NEGATIVE
Bilirubin, UA: NEGATIVE
Blood, UA: NEGATIVE
Glucose, UA: NEGATIVE
Ketones, UA: NEGATIVE
Leukocytes, UA: NEGATIVE
Nitrite, UA: NEGATIVE
Odor: NEGATIVE
Protein, UA: NEGATIVE
Spec Grav, UA: 1.015 (ref 1.010–1.025)
Urobilinogen, UA: 0.2 E.U./dL
pH, UA: 6.5 (ref 5.0–8.0)

## 2018-12-10 MED ORDER — CEFTRIAXONE SODIUM 1 G IJ SOLR
1.0000 g | Freq: Once | INTRAMUSCULAR | Status: AC
  Administered 2018-12-10: 1 g via INTRAMUSCULAR

## 2018-12-10 NOTE — Patient Instructions (Signed)
Last of 3 doses of Rocephin given today. Urine specimen is normal.

## 2018-12-10 NOTE — Progress Notes (Signed)
Clean catch urine s/p treatment with Rocephin is now normal. Last of 3 doses given today.

## 2018-12-10 NOTE — Addendum Note (Signed)
Addended by: Mady Haagensen on: 12/10/2018 12:11 PM   Modules accepted: Orders

## 2018-12-12 ENCOUNTER — Telehealth: Payer: Self-pay

## 2018-12-12 DIAGNOSIS — E876 Hypokalemia: Secondary | ICD-10-CM

## 2018-12-12 DIAGNOSIS — D500 Iron deficiency anemia secondary to blood loss (chronic): Secondary | ICD-10-CM

## 2018-12-15 ENCOUNTER — Other Ambulatory Visit: Payer: Medicare Other | Admitting: Internal Medicine

## 2018-12-15 ENCOUNTER — Other Ambulatory Visit: Payer: Self-pay | Admitting: Internal Medicine

## 2018-12-15 DIAGNOSIS — E876 Hypokalemia: Secondary | ICD-10-CM

## 2018-12-15 DIAGNOSIS — D500 Iron deficiency anemia secondary to blood loss (chronic): Secondary | ICD-10-CM

## 2018-12-16 DIAGNOSIS — Z8572 Personal history of non-Hodgkin lymphomas: Secondary | ICD-10-CM | POA: Diagnosis not present

## 2018-12-16 DIAGNOSIS — G9389 Other specified disorders of brain: Secondary | ICD-10-CM | POA: Diagnosis not present

## 2018-12-16 DIAGNOSIS — Z9889 Other specified postprocedural states: Secondary | ICD-10-CM | POA: Diagnosis not present

## 2018-12-16 DIAGNOSIS — C8589 Other specified types of non-Hodgkin lymphoma, extranodal and solid organ sites: Secondary | ICD-10-CM | POA: Diagnosis not present

## 2018-12-18 ENCOUNTER — Other Ambulatory Visit: Payer: Self-pay

## 2018-12-18 MED ORDER — POTASSIUM CHLORIDE ER 10 MEQ PO TBCR: 10.0000 meq | EXTENDED_RELEASE_TABLET | Freq: Every day | ORAL | 0 refills | Status: DC

## 2018-12-19 LAB — CBC WITH DIFFERENTIAL/PLATELET
Absolute Monocytes: 690 cells/uL (ref 200–950)
Basophils Absolute: 30 cells/uL (ref 0–200)
Basophils Relative: 0.4 %
Eosinophils Absolute: 60 cells/uL (ref 15–500)
Eosinophils Relative: 0.8 %
HCT: 33.2 % — ABNORMAL LOW (ref 35.0–45.0)
Hemoglobin: 11.3 g/dL — ABNORMAL LOW (ref 11.7–15.5)
Lymphs Abs: 1200 cells/uL (ref 850–3900)
MCH: 34 pg — ABNORMAL HIGH (ref 27.0–33.0)
MCHC: 34 g/dL (ref 32.0–36.0)
MCV: 100 fL (ref 80.0–100.0)
MPV: 9.6 fL (ref 7.5–12.5)
Monocytes Relative: 9.2 %
Neutro Abs: 5520 cells/uL (ref 1500–7800)
Neutrophils Relative %: 73.6 %
Platelets: 326 10*3/uL (ref 140–400)
RBC: 3.32 10*6/uL — ABNORMAL LOW (ref 3.80–5.10)
RDW: 11.8 % (ref 11.0–15.0)
Total Lymphocyte: 16 %
WBC: 7.5 10*3/uL (ref 3.8–10.8)

## 2018-12-19 LAB — BASIC METABOLIC PANEL
BUN/Creatinine Ratio: 27 (calc) — ABNORMAL HIGH (ref 6–22)
BUN: 36 mg/dL — ABNORMAL HIGH (ref 7–25)
CO2: 23 mmol/L (ref 20–32)
Calcium: 7.3 mg/dL — ABNORMAL LOW (ref 8.6–10.4)
Chloride: 97 mmol/L — ABNORMAL LOW (ref 98–110)
Creat: 1.31 mg/dL — ABNORMAL HIGH (ref 0.50–0.99)
Glucose, Bld: 79 mg/dL (ref 65–99)
Potassium: 3.1 mmol/L — ABNORMAL LOW (ref 3.5–5.3)
Sodium: 138 mmol/L (ref 135–146)

## 2018-12-22 DIAGNOSIS — I87311 Chronic venous hypertension (idiopathic) with ulcer of right lower extremity: Secondary | ICD-10-CM | POA: Diagnosis not present

## 2018-12-22 DIAGNOSIS — I10 Essential (primary) hypertension: Secondary | ICD-10-CM | POA: Diagnosis not present

## 2018-12-22 DIAGNOSIS — L97312 Non-pressure chronic ulcer of right ankle with fat layer exposed: Secondary | ICD-10-CM | POA: Diagnosis not present

## 2018-12-22 DIAGNOSIS — L97812 Non-pressure chronic ulcer of other part of right lower leg with fat layer exposed: Secondary | ICD-10-CM | POA: Diagnosis not present

## 2018-12-22 DIAGNOSIS — I87331 Chronic venous hypertension (idiopathic) with ulcer and inflammation of right lower extremity: Secondary | ICD-10-CM | POA: Diagnosis not present

## 2018-12-22 DIAGNOSIS — L97212 Non-pressure chronic ulcer of right calf with fat layer exposed: Secondary | ICD-10-CM | POA: Diagnosis not present

## 2018-12-25 ENCOUNTER — Other Ambulatory Visit: Payer: Medicare Other | Admitting: Internal Medicine

## 2018-12-25 DIAGNOSIS — E876 Hypokalemia: Secondary | ICD-10-CM

## 2018-12-25 LAB — POTASSIUM: Potassium: 4.1 mmol/L (ref 3.5–5.3)

## 2018-12-28 ENCOUNTER — Encounter: Payer: Self-pay | Admitting: Internal Medicine

## 2018-12-29 DIAGNOSIS — L97812 Non-pressure chronic ulcer of other part of right lower leg with fat layer exposed: Secondary | ICD-10-CM | POA: Diagnosis not present

## 2018-12-29 DIAGNOSIS — I10 Essential (primary) hypertension: Secondary | ICD-10-CM | POA: Diagnosis not present

## 2018-12-29 DIAGNOSIS — I87331 Chronic venous hypertension (idiopathic) with ulcer and inflammation of right lower extremity: Secondary | ICD-10-CM | POA: Diagnosis not present

## 2018-12-29 DIAGNOSIS — I87311 Chronic venous hypertension (idiopathic) with ulcer of right lower extremity: Secondary | ICD-10-CM | POA: Diagnosis not present

## 2018-12-29 DIAGNOSIS — L97312 Non-pressure chronic ulcer of right ankle with fat layer exposed: Secondary | ICD-10-CM | POA: Diagnosis not present

## 2018-12-29 DIAGNOSIS — L97212 Non-pressure chronic ulcer of right calf with fat layer exposed: Secondary | ICD-10-CM | POA: Diagnosis not present

## 2019-01-05 ENCOUNTER — Encounter (HOSPITAL_BASED_OUTPATIENT_CLINIC_OR_DEPARTMENT_OTHER): Payer: Medicare Other | Attending: Internal Medicine

## 2019-01-05 DIAGNOSIS — I87311 Chronic venous hypertension (idiopathic) with ulcer of right lower extremity: Secondary | ICD-10-CM | POA: Diagnosis not present

## 2019-01-05 DIAGNOSIS — I87331 Chronic venous hypertension (idiopathic) with ulcer and inflammation of right lower extremity: Secondary | ICD-10-CM | POA: Insufficient documentation

## 2019-01-05 DIAGNOSIS — L97312 Non-pressure chronic ulcer of right ankle with fat layer exposed: Secondary | ICD-10-CM | POA: Diagnosis not present

## 2019-01-05 DIAGNOSIS — L97812 Non-pressure chronic ulcer of other part of right lower leg with fat layer exposed: Secondary | ICD-10-CM | POA: Diagnosis not present

## 2019-01-05 DIAGNOSIS — I10 Essential (primary) hypertension: Secondary | ICD-10-CM | POA: Diagnosis not present

## 2019-01-05 DIAGNOSIS — L97212 Non-pressure chronic ulcer of right calf with fat layer exposed: Secondary | ICD-10-CM | POA: Diagnosis not present

## 2019-01-12 ENCOUNTER — Other Ambulatory Visit: Payer: Self-pay | Admitting: Internal Medicine

## 2019-01-12 DIAGNOSIS — L97212 Non-pressure chronic ulcer of right calf with fat layer exposed: Secondary | ICD-10-CM | POA: Diagnosis not present

## 2019-01-12 DIAGNOSIS — I87331 Chronic venous hypertension (idiopathic) with ulcer and inflammation of right lower extremity: Secondary | ICD-10-CM | POA: Diagnosis not present

## 2019-01-12 DIAGNOSIS — I87311 Chronic venous hypertension (idiopathic) with ulcer of right lower extremity: Secondary | ICD-10-CM | POA: Diagnosis not present

## 2019-01-12 DIAGNOSIS — L97312 Non-pressure chronic ulcer of right ankle with fat layer exposed: Secondary | ICD-10-CM | POA: Diagnosis not present

## 2019-01-12 DIAGNOSIS — L97812 Non-pressure chronic ulcer of other part of right lower leg with fat layer exposed: Secondary | ICD-10-CM | POA: Diagnosis not present

## 2019-01-12 DIAGNOSIS — I10 Essential (primary) hypertension: Secondary | ICD-10-CM | POA: Diagnosis not present

## 2019-01-13 ENCOUNTER — Encounter: Payer: Self-pay | Admitting: Emergency Medicine

## 2019-01-13 ENCOUNTER — Ambulatory Visit (INDEPENDENT_AMBULATORY_CARE_PROVIDER_SITE_OTHER): Payer: Medicare Other | Admitting: Emergency Medicine

## 2019-01-13 VITALS — BP 118/52 | HR 84 | Ht 67.0 in | Wt 140.6 lb

## 2019-01-13 DIAGNOSIS — M349 Systemic sclerosis, unspecified: Secondary | ICD-10-CM | POA: Diagnosis not present

## 2019-01-13 DIAGNOSIS — I2729 Other secondary pulmonary hypertension: Secondary | ICD-10-CM

## 2019-01-13 DIAGNOSIS — J9611 Chronic respiratory failure with hypoxia: Secondary | ICD-10-CM | POA: Diagnosis not present

## 2019-01-13 DIAGNOSIS — J3489 Other specified disorders of nose and nasal sinuses: Secondary | ICD-10-CM | POA: Diagnosis not present

## 2019-01-13 DIAGNOSIS — H6504 Acute serous otitis media, recurrent, right ear: Secondary | ICD-10-CM | POA: Diagnosis not present

## 2019-01-13 DIAGNOSIS — J849 Interstitial pulmonary disease, unspecified: Secondary | ICD-10-CM

## 2019-01-13 NOTE — Assessment & Plan Note (Signed)
Continue current oxygen at 2 to 3 L/min

## 2019-01-13 NOTE — Patient Instructions (Signed)
We will arrange for a repeat echocardiogram. Review with Dr [name] when you see him in February We will perform a high-resolution CT scan of the chest to compare with your prior Please continue Opsumit, sildenafil as you have been taking them Continue your prednisone 5 mg daily. Continue your oxygen at 2 to 3 L/min at all times. Follow with Dr Lamonte Sakai in March 2020

## 2019-01-13 NOTE — Assessment & Plan Note (Signed)
Tolerating sildenafil and macitentan.  She does have some difficulty getting the macitentan due to expense, wants to discuss with Dr. [name] possibly changing to bosentan or ambrisentan.  I will check an echocardiogram now so that she can review with him when they follow-up in February

## 2019-01-13 NOTE — Assessment & Plan Note (Signed)
She has been on prednisone 5 mg daily for an extended period of time.  Her exam is reassuring, no significant change in basilar crackles.  She needs a repeat CT scan of her chest to compare with 2017

## 2019-01-13 NOTE — Assessment & Plan Note (Signed)
Chronic prednisone 5 mg daily.  Consider adjusting depending on her pulmonary pressures, interstitial lung disease on her repeat CT

## 2019-01-13 NOTE — Progress Notes (Signed)
Subjective:    [name], female    DOB: August 06, 1951, 68 y.o.   MRN: [id number] HPI 68 yo woman, never smoker, hx of scleroderma, Raynaud's, non-Hodgkins lymphoma (treated '10), HTN, PAH originally dx in [place] and treated, but meds d/c'd when she improved. Has been seen in by Dr [name], had R heart cath as below in 03/2012. follow up 03/2017.   ROV 01/13/19 --Mrs. Kyer is 68 and has a history of scleroderma with significant associated interstitial lung disease and pulmonary hypertension, chronic hypoxemic respiratory failure.  She currently uses oxygen at 2 to 3 L/min.  She is maintained on prednisone 5 mg daily. Her PAH meds include sildenafil, macitentan,  She has been stable since last time. 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 She was on doxycycline over the last few weeks - for her finger. She has also had some depression. She felt that the doxy contributed to the depression, to some SOB.    04/19/17 --  RA = 4 RV = 80/8 PA = 79/17 (43) PCW = 6 Fick cardiac output/index = 4.1/2.4 PVR = 9.0 WU Ao sat = 94% PA sat = 59%, 62%   Underwent RHC on 04/18/12 as below:  RA = 9  RV = 75/11/14  PA = 71/25 (44)  PCW = 4  Fick cardiac output/index = 3.0/1.7  Thermo CO/CI = 2.7/1.5  PVR = 23.5 [place]  FA sat = 79% on room air (checked 3 times). Sat with finger probe on air 94%  PA sat = 41%, 43%  MW 04/18/12 880 feet  No results for input(s): HGB, HCT, WBC, PLT in the last 168 hours.    Review of Systems  Constitutional: Negative for appetite change, fever and unexpected weight change.  HENT: Negative for congestion, dental problem, ear pain, nosebleeds, postnasal drip, rhinorrhea, sinus pressure, sneezing, sore throat and trouble swallowing.   Eyes: Negative for redness and itching.  Respiratory: Positive for shortness of breath. Negative for cough, chest tightness and wheezing.   Cardiovascular: Negative  for palpitations and leg swelling.  Gastrointestinal: Negative for nausea and vomiting.  Genitourinary: Negative for dysuria.  Musculoskeletal: Negative for joint swelling.  Skin: Negative for rash.  Neurological: Negative for headaches.  Hematological: Does not bruise/bleed easily.  Psychiatric/Behavioral: Negative for dysphoric mood. The patient is not nervous/anxious.        Objective:   Physical Exam Vitals:   01/13/19 1101  BP: (!) 118/52  Pulse: 84  SpO2: 99%  Weight: 140 lb 9.6 oz (63.8 kg)  Height: _0  (1.702 m)   Gen: Pleasant, thin, in no distress,  normal affect on pulsed nasal cannula O2  ENT: No lesions,  mouth clear,  oropharynx clear, no postnasal drip  Neck: No JVD, no stridor  Lungs: No use of accessory muscles, few bibasilar insp crackles  Cardiovascular: RRR, heart sounds normal, no murmur or gallops, trace peripheral edema  Musculoskeletal: No deformities, no cyanosis or clubbing  Neuro: alert, non focal  Skin: significant changes from scleroderma on arms, hands, fingers; several amputations.   Right lower extremity shin and calf are wrapped and dressed      Assessment & Plan:  ILD (interstitial lung disease) ([place]) She has been on prednisone 5 mg daily for an extended period of time.  Her exam is reassuring, no significant change in basilar crackles.  She needs a repeat CT scan of her chest to compare  with 2017  Chronic respiratory failure (HCC) Continue current oxygen at 2 to 3 L/min  Scleroderma (HCC) Chronic prednisone 5 mg daily.  Consider adjusting depending on her pulmonary pressures, interstitial lung disease on her repeat CT  Pulmonary hypertension associated with systemic disorder Tolerating sildenafil and macitentan.  She does have some difficulty getting the macitentan due to expense, wants to discuss with Dr. [name] possibly changing to bosentan or ambrisentan.  I will check an echocardiogram now so that she can review with him  when they follow-up in February  Baltazar Apo, MD, PhD 01/13/2019, 11:35 AM [facility] [phone] or if no answer [phone]

## 2019-01-15 DIAGNOSIS — L570 Actinic keratosis: Secondary | ICD-10-CM | POA: Diagnosis not present

## 2019-01-15 DIAGNOSIS — Z85828 Personal history of other malignant neoplasm of skin: Secondary | ICD-10-CM | POA: Diagnosis not present

## 2019-01-15 DIAGNOSIS — L905 Scar conditions and fibrosis of skin: Secondary | ICD-10-CM | POA: Diagnosis not present

## 2019-01-16 ENCOUNTER — Other Ambulatory Visit: Payer: Self-pay

## 2019-01-16 ENCOUNTER — Ambulatory Visit (HOSPITAL_COMMUNITY): Payer: Medicare Other | Attending: Cardiovascular Disease

## 2019-01-16 DIAGNOSIS — I2729 Other secondary pulmonary hypertension: Secondary | ICD-10-CM | POA: Insufficient documentation

## 2019-01-19 DIAGNOSIS — I87331 Chronic venous hypertension (idiopathic) with ulcer and inflammation of right lower extremity: Secondary | ICD-10-CM | POA: Diagnosis not present

## 2019-01-19 DIAGNOSIS — L97212 Non-pressure chronic ulcer of right calf with fat layer exposed: Secondary | ICD-10-CM | POA: Diagnosis not present

## 2019-01-19 DIAGNOSIS — I87311 Chronic venous hypertension (idiopathic) with ulcer of right lower extremity: Secondary | ICD-10-CM | POA: Diagnosis not present

## 2019-01-19 DIAGNOSIS — L97812 Non-pressure chronic ulcer of other part of right lower leg with fat layer exposed: Secondary | ICD-10-CM | POA: Diagnosis not present

## 2019-01-19 DIAGNOSIS — I10 Essential (primary) hypertension: Secondary | ICD-10-CM | POA: Diagnosis not present

## 2019-01-19 DIAGNOSIS — L97312 Non-pressure chronic ulcer of right ankle with fat layer exposed: Secondary | ICD-10-CM | POA: Diagnosis not present

## 2019-01-21 ENCOUNTER — Encounter (HOSPITAL_COMMUNITY): Payer: Self-pay

## 2019-01-21 ENCOUNTER — Ambulatory Visit (HOSPITAL_COMMUNITY)
Admission: RE | Admit: 2019-01-21 | Discharge: 2019-01-21 | Disposition: A | Discharge status: Home / Self Care | Payer: Medicare Other | Source: Ambulatory Visit | Attending: Emergency Medicine | Admitting: Emergency Medicine

## 2019-01-21 DIAGNOSIS — J849 Interstitial pulmonary disease, unspecified: Secondary | ICD-10-CM | POA: Diagnosis not present

## 2019-01-21 DIAGNOSIS — R918 Other nonspecific abnormal finding of lung field: Secondary | ICD-10-CM | POA: Diagnosis not present

## 2019-01-22 ENCOUNTER — Encounter: Payer: Self-pay | Admitting: Internal Medicine

## 2019-01-22 ENCOUNTER — Ambulatory Visit (INDEPENDENT_AMBULATORY_CARE_PROVIDER_SITE_OTHER): Payer: Medicare Other | Admitting: Internal Medicine

## 2019-01-22 VITALS — BP 120/60 | HR 70 | Temp 98.8°F | Ht 67.0 in

## 2019-01-22 DIAGNOSIS — M810 Age-related osteoporosis without current pathological fracture: Secondary | ICD-10-CM | POA: Diagnosis not present

## 2019-01-22 NOTE — Patient Instructions (Signed)
Prolia given by CMA

## 2019-01-22 NOTE — Progress Notes (Signed)
Prolia given by CMA.  Follow-up with another injection in 6 months

## 2019-01-23 DIAGNOSIS — M810 Age-related osteoporosis without current pathological fracture: Secondary | ICD-10-CM | POA: Diagnosis not present

## 2019-01-23 MED ORDER — DENOSUMAB 60 MG/ML ~~LOC~~ SOSY
60.0000 mg | PREFILLED_SYRINGE | Freq: Once | SUBCUTANEOUS | Status: AC
  Administered 2019-01-23: 60 mg via SUBCUTANEOUS

## 2019-01-26 ENCOUNTER — Other Ambulatory Visit: Payer: Self-pay | Admitting: Internal Medicine

## 2019-01-26 DIAGNOSIS — I87311 Chronic venous hypertension (idiopathic) with ulcer of right lower extremity: Secondary | ICD-10-CM | POA: Diagnosis not present

## 2019-01-26 DIAGNOSIS — I10 Essential (primary) hypertension: Secondary | ICD-10-CM | POA: Diagnosis not present

## 2019-01-26 DIAGNOSIS — I87331 Chronic venous hypertension (idiopathic) with ulcer and inflammation of right lower extremity: Secondary | ICD-10-CM | POA: Diagnosis not present

## 2019-01-26 DIAGNOSIS — L97212 Non-pressure chronic ulcer of right calf with fat layer exposed: Secondary | ICD-10-CM | POA: Diagnosis not present

## 2019-01-26 DIAGNOSIS — L97312 Non-pressure chronic ulcer of right ankle with fat layer exposed: Secondary | ICD-10-CM | POA: Diagnosis not present

## 2019-01-26 DIAGNOSIS — L97812 Non-pressure chronic ulcer of other part of right lower leg with fat layer exposed: Secondary | ICD-10-CM | POA: Diagnosis not present

## 2019-01-27 DIAGNOSIS — D638 Anemia in other chronic diseases classified elsewhere: Secondary | ICD-10-CM | POA: Diagnosis not present

## 2019-01-27 DIAGNOSIS — M79672 Pain in left foot: Secondary | ICD-10-CM | POA: Diagnosis not present

## 2019-01-27 DIAGNOSIS — G629 Polyneuropathy, unspecified: Secondary | ICD-10-CM | POA: Diagnosis not present

## 2019-01-27 DIAGNOSIS — J069 Acute upper respiratory infection, unspecified: Secondary | ICD-10-CM | POA: Diagnosis not present

## 2019-01-27 DIAGNOSIS — Z6821 Body mass index (BMI) 21.0-21.9, adult: Secondary | ICD-10-CM | POA: Diagnosis not present

## 2019-01-27 DIAGNOSIS — M340 Progressive systemic sclerosis: Secondary | ICD-10-CM | POA: Diagnosis not present

## 2019-01-27 DIAGNOSIS — I7301 Raynaud's syndrome with gangrene: Secondary | ICD-10-CM | POA: Diagnosis not present

## 2019-01-27 DIAGNOSIS — K219 Gastro-esophageal reflux disease without esophagitis: Secondary | ICD-10-CM | POA: Diagnosis not present

## 2019-01-27 DIAGNOSIS — S81801D Unspecified open wound, right lower leg, subsequent encounter: Secondary | ICD-10-CM | POA: Diagnosis not present

## 2019-01-27 DIAGNOSIS — M810 Age-related osteoporosis without current pathological fracture: Secondary | ICD-10-CM | POA: Diagnosis not present

## 2019-01-28 ENCOUNTER — Other Ambulatory Visit (HOSPITAL_COMMUNITY): Payer: Self-pay | Admitting: Internal Medicine

## 2019-01-28 ENCOUNTER — Other Ambulatory Visit (HOSPITAL_COMMUNITY): Payer: Self-pay

## 2019-01-28 DIAGNOSIS — I2729 Other secondary pulmonary hypertension: Secondary | ICD-10-CM

## 2019-01-28 DIAGNOSIS — L97919 Non-pressure chronic ulcer of unspecified part of right lower leg with unspecified severity: Secondary | ICD-10-CM

## 2019-01-29 ENCOUNTER — Ambulatory Visit (HOSPITAL_COMMUNITY)
Admission: RE | Admit: 2019-01-29 | Discharge: 2019-01-29 | Disposition: A | Discharge status: Home / Self Care | Payer: Medicare Other | Source: Ambulatory Visit | Attending: Family | Admitting: Family

## 2019-01-29 DIAGNOSIS — L97919 Non-pressure chronic ulcer of unspecified part of right lower leg with unspecified severity: Secondary | ICD-10-CM | POA: Diagnosis not present

## 2019-02-02 ENCOUNTER — Encounter (HOSPITAL_BASED_OUTPATIENT_CLINIC_OR_DEPARTMENT_OTHER): Payer: Medicare Other | Attending: Internal Medicine

## 2019-02-02 DIAGNOSIS — L97412 Non-pressure chronic ulcer of right heel and midfoot with fat layer exposed: Secondary | ICD-10-CM | POA: Diagnosis not present

## 2019-02-02 DIAGNOSIS — I8392 Asymptomatic varicose veins of left lower extremity: Secondary | ICD-10-CM | POA: Insufficient documentation

## 2019-02-02 DIAGNOSIS — I87311 Chronic venous hypertension (idiopathic) with ulcer of right lower extremity: Secondary | ICD-10-CM | POA: Diagnosis not present

## 2019-02-02 DIAGNOSIS — I10 Essential (primary) hypertension: Secondary | ICD-10-CM | POA: Insufficient documentation

## 2019-02-02 DIAGNOSIS — M069 Rheumatoid arthritis, unspecified: Secondary | ICD-10-CM | POA: Insufficient documentation

## 2019-02-02 DIAGNOSIS — L97312 Non-pressure chronic ulcer of right ankle with fat layer exposed: Secondary | ICD-10-CM | POA: Insufficient documentation

## 2019-02-02 DIAGNOSIS — M349 Systemic sclerosis, unspecified: Secondary | ICD-10-CM | POA: Diagnosis not present

## 2019-02-02 DIAGNOSIS — L97812 Non-pressure chronic ulcer of other part of right lower leg with fat layer exposed: Secondary | ICD-10-CM | POA: Insufficient documentation

## 2019-02-02 DIAGNOSIS — I7300 Raynaud's syndrome without gangrene: Secondary | ICD-10-CM | POA: Diagnosis not present

## 2019-02-02 DIAGNOSIS — I87331 Chronic venous hypertension (idiopathic) with ulcer and inflammation of right lower extremity: Secondary | ICD-10-CM | POA: Insufficient documentation

## 2019-02-02 DIAGNOSIS — L97819 Non-pressure chronic ulcer of other part of right lower leg with unspecified severity: Secondary | ICD-10-CM | POA: Insufficient documentation

## 2019-02-02 DIAGNOSIS — L97212 Non-pressure chronic ulcer of right calf with fat layer exposed: Secondary | ICD-10-CM | POA: Diagnosis not present

## 2019-02-02 DIAGNOSIS — I739 Peripheral vascular disease, unspecified: Secondary | ICD-10-CM | POA: Diagnosis not present

## 2019-02-03 DIAGNOSIS — Z7289 Other problems related to lifestyle: Secondary | ICD-10-CM | POA: Diagnosis not present

## 2019-02-03 DIAGNOSIS — H9191 Unspecified hearing loss, right ear: Secondary | ICD-10-CM | POA: Diagnosis not present

## 2019-02-03 DIAGNOSIS — H6591 Unspecified nonsuppurative otitis media, right ear: Secondary | ICD-10-CM | POA: Diagnosis not present

## 2019-02-03 DIAGNOSIS — H938X3 Other specified disorders of ear, bilateral: Secondary | ICD-10-CM | POA: Diagnosis not present

## 2019-02-09 ENCOUNTER — Other Ambulatory Visit (HOSPITAL_COMMUNITY): Payer: Self-pay | Admitting: Internal Medicine

## 2019-02-09 DIAGNOSIS — L97312 Non-pressure chronic ulcer of right ankle with fat layer exposed: Secondary | ICD-10-CM | POA: Diagnosis not present

## 2019-02-09 DIAGNOSIS — L97812 Non-pressure chronic ulcer of other part of right lower leg with fat layer exposed: Secondary | ICD-10-CM | POA: Diagnosis not present

## 2019-02-09 DIAGNOSIS — Z8572 Personal history of non-Hodgkin lymphomas: Secondary | ICD-10-CM

## 2019-02-09 DIAGNOSIS — I87311 Chronic venous hypertension (idiopathic) with ulcer of right lower extremity: Secondary | ICD-10-CM | POA: Diagnosis not present

## 2019-02-09 DIAGNOSIS — C8590 Non-Hodgkin lymphoma, unspecified, unspecified site: Secondary | ICD-10-CM

## 2019-02-09 DIAGNOSIS — I739 Peripheral vascular disease, unspecified: Secondary | ICD-10-CM | POA: Diagnosis not present

## 2019-02-09 DIAGNOSIS — I87331 Chronic venous hypertension (idiopathic) with ulcer and inflammation of right lower extremity: Secondary | ICD-10-CM | POA: Diagnosis not present

## 2019-02-09 DIAGNOSIS — I10 Essential (primary) hypertension: Secondary | ICD-10-CM | POA: Diagnosis not present

## 2019-02-09 DIAGNOSIS — L97819 Non-pressure chronic ulcer of other part of right lower leg with unspecified severity: Secondary | ICD-10-CM | POA: Diagnosis not present

## 2019-02-09 DIAGNOSIS — L97212 Non-pressure chronic ulcer of right calf with fat layer exposed: Secondary | ICD-10-CM | POA: Diagnosis not present

## 2019-02-16 DIAGNOSIS — L97812 Non-pressure chronic ulcer of other part of right lower leg with fat layer exposed: Secondary | ICD-10-CM | POA: Diagnosis not present

## 2019-02-16 DIAGNOSIS — L97819 Non-pressure chronic ulcer of other part of right lower leg with unspecified severity: Secondary | ICD-10-CM | POA: Diagnosis not present

## 2019-02-16 DIAGNOSIS — L97212 Non-pressure chronic ulcer of right calf with fat layer exposed: Secondary | ICD-10-CM | POA: Diagnosis not present

## 2019-02-16 DIAGNOSIS — I739 Peripheral vascular disease, unspecified: Secondary | ICD-10-CM | POA: Diagnosis not present

## 2019-02-16 DIAGNOSIS — I87311 Chronic venous hypertension (idiopathic) with ulcer of right lower extremity: Secondary | ICD-10-CM | POA: Diagnosis not present

## 2019-02-16 DIAGNOSIS — I87331 Chronic venous hypertension (idiopathic) with ulcer and inflammation of right lower extremity: Secondary | ICD-10-CM | POA: Diagnosis not present

## 2019-02-16 DIAGNOSIS — L97312 Non-pressure chronic ulcer of right ankle with fat layer exposed: Secondary | ICD-10-CM | POA: Diagnosis not present

## 2019-02-16 DIAGNOSIS — I10 Essential (primary) hypertension: Secondary | ICD-10-CM | POA: Diagnosis not present

## 2019-02-17 ENCOUNTER — Encounter: Payer: Self-pay | Admitting: Vascular Surgery

## 2019-02-17 ENCOUNTER — Other Ambulatory Visit: Payer: Self-pay

## 2019-02-17 ENCOUNTER — Ambulatory Visit (INDEPENDENT_AMBULATORY_CARE_PROVIDER_SITE_OTHER): Payer: Medicare Other | Admitting: Vascular Surgery

## 2019-02-17 VITALS — Resp 16 | Ht 67.5 in | Wt 138.0 lb

## 2019-02-17 DIAGNOSIS — I739 Peripheral vascular disease, unspecified: Secondary | ICD-10-CM | POA: Diagnosis not present

## 2019-02-17 NOTE — Progress Notes (Signed)
[facility] [place]  Patient name: [name] [name] MRN: [id number] DOB: 1951/01/02 Sex: female  REASON FOR VISIT: Follow-up right leg ulcer  HPI: [name] [name] is a 68 y.o. female well-known to me from prior treatment of venous stasis disease with laser ablation.  She also had peripheral vascular occlusive disease and underwent right femoral endarterectomy and Dacron patch.  She has had recurrent ulceration in her right calf.  She has severe scleroderma and has had actually several fingers amputated since my last visit with her.  Past Medical History:  Diagnosis Date  . Anemia   . Arthritis   . DOE (dyspnea on exertion)   . Epistaxis   . History of chicken pox   . Hypertension   . Hypothyroidism   . Leg ulcer ([place])   . Melanoma ([place]) 1999  . 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[place])   . Non Hodgkin's lymphoma ([place])    Treated with chemo 2010, felt to be cured.  Marland Kitchen PAH (pulmonary artery hypertension) ([place])   . Peripheral arterial occlusive disease ([place])   . Pulmonary hypertension ([place])   . Requires supplemental oxygen    2 liters-3liters when moving  . Sclerodermia generalized ([employer])     Family History  Problem Relation Age of Onset  . Lupus Father   . Lymphoma Father   . Hyperlipidemia Mother   . Cancer Daughter        sarcoma  . Colon cancer Other     SOCIAL HISTORY: Social History   Tobacco Use  . Smoking status: Never Smoker  . Smokeless tobacco: Never Used  Substance Use Topics  . Alcohol use: Yes    Alcohol/week: 0.0 standard drinks    Comment: 1-2 drinks per day wine.liquor or beer    Allergies  Allergen Reactions  . Levofloxacin Other (See Comments)    FLU LIKE SYMPTOM Other reaction(s): Other (See Comments) FLU LIKE SYMPTOM  . Sulfa Antibiotics Rash and Other (See Comments)    Face peeled Severe rash with significant peeling of face. No airway involvement per patient Face peeled  . Levofloxacin In D5w  Other (See Comments)    FLU LIKE SYMPTOMS FLU LIKE SYMPTOMS  . Doxycycline Hyclate Swelling    SWELLING REACTION UNSPECIFIED  "TABLETS ONLY - CAPSULES ARE TOLERATED FINE"  . Penicillins Rash    Has patient had a PCN reaction causing immediate rash, facial/tongue/throat swelling, SOB or lightheadedness with hypotension: no Has patient had a PCN reaction causing severe rash involving mucus membranes or skin necrosis: no Has patient had a PCN reaction that required hospitalization: no Has patient had a PCN reaction occurring within the last 10 years: {no If all of the above answers are "NO", then may proceed with Cephalosporin use.    Current Outpatient Medications  Medication Sig Dispense Refill  . acetaminophen (TYLENOL ARTHRITIS PAIN) 650 MG CR tablet Take 1,300 mg by mouth every 8 (eight) hours.     Marland Kitchen atorvastatin (LIPITOR) 10 MG tablet Take 1 tablet (10 mg total) by mouth daily. 90 tablet 3  . Biotin 5000 MCG TABS Take 5,000 mcg by mouth daily.    Marland Kitchen CALCIUM-MAGNESIUM-VITAMIN D PO Take 2 tablets by mouth daily.    . Cholecalciferol (VITAMIN D) 2000 units CAPS Take 2,000 Units by mouth daily.    . citalopram (CELEXA) 20 MG tablet TAKE 1 TABLET BY MOUTH EVERYDAY AT BEDTIME 90 tablet 3  . clopidogrel (PLAVIX) 75 MG tablet Take 1 tablet (75 mg total)  by mouth daily. 90 tablet 3  . FERREX 150 150 MG capsule TAKE ONE CAPSULE BY MOUTH TWICE A DAY 180 capsule 3  . fish oil-omega-3 fatty acids 1000 MG capsule Take 1,000 mg by mouth 2 (two) times daily.     Marland Kitchen gabapentin (NEURONTIN) 300 MG capsule Take 600 mg by mouth 2 (two) times daily.    Marland Kitchen levothyroxine (SYNTHROID, LEVOTHROID) 50 MCG tablet TAKE 1 TABLET (50 MCG TOTAL) BY MOUTH DAILY BEFORE BREAKFAST. 90 tablet 1  . Multiple Vitamin (MULTIVITAMIN WITH MINERALS) TABS tablet Take 1 tablet by mouth daily.    . OPSUMIT 10 MG tablet Take 1 tablet (62m) by mouth daily. 30 tablet 0  . OXYGEN Inhale 2-3 L into the lungs continuous.     .  pantoprazole (PROTONIX) 40 MG tablet Take 1 tablet (40 mg total) by mouth 2 (two) times daily. 60 tablet 1  . potassium chloride (K-DUR) 10 MEQ tablet TAKE 1 TABLET BY MOUTH EVERY DAY 90 tablet 1  . potassium chloride SA (K-DUR,KLOR-CON) 20 MEQ tablet Take 1 tablet (20 mEq total) by mouth daily. 30 tablet 3  . predniSONE (DELTASONE) 5 MG tablet Take 5 mg by mouth daily with breakfast.    . Probiotic CAPS Take 1 capsule by mouth daily.    . sildenafil (REVATIO) 20 MG tablet Take 3 tablets (60 mg total) by mouth 3 (three) times daily. 810 tablet 1  . spironolactone (ALDACTONE) 25 MG tablet TAKE 1 TABLET BY MOUTH EVERY DAY 90 tablet 1  . torsemide (DEMADEX) 20 MG tablet Take 2 tablets (40 mg total) by mouth 2 (two) times daily. 360 tablet 3  . vitamin B-12 (CYANOCOBALAMIN) 1000 MCG tablet Take 1,000 mcg by mouth daily.     No current facility-administered medications for this visit.     REVIEW OF SYSTEMS:  _0  denotes positive finding, _1  denotes negative finding Cardiac  Comments:  Chest pain or chest pressure:    Shortness of breath upon exertion: x   Short of breath when lying flat:    Irregular heart rhythm:        Vascular    Pain in calf, thigh, or hip brought on by ambulation:    Pain in feet at night that wakes you up from your sleep:     Blood clot in your veins:    Leg swelling:  x         PHYSICAL EXAM: Vitals:   02/17/19 1551  Resp: 16  Weight: 138 lb (62.6 kg)  Height: 5' 7.5" (1.715 m)    GENERAL: The patient is a well-nourished female, in no acute distress. The vital signs are documented above. CARDIOVASCULAR: She does have palpable femoral and palpable popliteal pulses bilaterally.  I do not palpate pedal pulses PULMONARY: There is good air exchange  MUSCULOSKELETAL: There are no major deformities or cyanosis. NEUROLOGIC: No focal weakness or paresthesias are detected. SKIN: There are no ulcers or rashes noted. PSYCHIATRIC: The patient has a normal  affect.  DATA:  Noninvasive lower extremity studies from several weeks ago were reviewed with the patient and her husband present.  This shows normal ankle arm index.  She does have monophasic waveforms suggesting tibial disease.  MEDICAL ISSUES: Did not feel that she has any evidence of arterial insufficiency which would suggest reasons for slowing of her healing.  I did explain that with her venous stasis disease and scleroderma that she will in all likelihood have a lifelong issues with healing.  She understands this and will see [place] again on an as-needed basis    [name], MD [facility] Tel [phone] Pager [phone]

## 2019-02-23 DIAGNOSIS — L97819 Non-pressure chronic ulcer of other part of right lower leg with unspecified severity: Secondary | ICD-10-CM | POA: Diagnosis not present

## 2019-02-23 DIAGNOSIS — I87311 Chronic venous hypertension (idiopathic) with ulcer of right lower extremity: Secondary | ICD-10-CM | POA: Diagnosis not present

## 2019-02-23 DIAGNOSIS — I10 Essential (primary) hypertension: Secondary | ICD-10-CM | POA: Diagnosis not present

## 2019-02-23 DIAGNOSIS — I87331 Chronic venous hypertension (idiopathic) with ulcer and inflammation of right lower extremity: Secondary | ICD-10-CM | POA: Diagnosis not present

## 2019-02-23 DIAGNOSIS — L97812 Non-pressure chronic ulcer of other part of right lower leg with fat layer exposed: Secondary | ICD-10-CM | POA: Diagnosis not present

## 2019-02-23 DIAGNOSIS — L97212 Non-pressure chronic ulcer of right calf with fat layer exposed: Secondary | ICD-10-CM | POA: Diagnosis not present

## 2019-02-23 DIAGNOSIS — L97312 Non-pressure chronic ulcer of right ankle with fat layer exposed: Secondary | ICD-10-CM | POA: Diagnosis not present

## 2019-02-23 DIAGNOSIS — I739 Peripheral vascular disease, unspecified: Secondary | ICD-10-CM | POA: Diagnosis not present

## 2019-02-25 ENCOUNTER — Other Ambulatory Visit (HOSPITAL_COMMUNITY): Payer: Self-pay

## 2019-02-25 ENCOUNTER — Ambulatory Visit (HOSPITAL_COMMUNITY)
Admission: RE | Admit: 2019-02-25 | Discharge: 2019-02-25 | Disposition: A | Discharge status: Home / Self Care | Payer: Medicare Other | Source: Ambulatory Visit | Attending: Internal Medicine | Admitting: Internal Medicine

## 2019-02-25 VITALS — BP 130/72 | HR 70 | Wt 142.4 lb

## 2019-02-25 DIAGNOSIS — Z880 Allergy status to penicillin: Secondary | ICD-10-CM | POA: Insufficient documentation

## 2019-02-25 DIAGNOSIS — Z882 Allergy status to sulfonamides status: Secondary | ICD-10-CM | POA: Insufficient documentation

## 2019-02-25 DIAGNOSIS — I739 Peripheral vascular disease, unspecified: Secondary | ICD-10-CM

## 2019-02-25 DIAGNOSIS — Z85820 Personal history of malignant melanoma of skin: Secondary | ICD-10-CM | POA: Insufficient documentation

## 2019-02-25 DIAGNOSIS — Z881 Allergy status to other antibiotic agents status: Secondary | ICD-10-CM | POA: Insufficient documentation

## 2019-02-25 DIAGNOSIS — J849 Interstitial pulmonary disease, unspecified: Secondary | ICD-10-CM | POA: Diagnosis not present

## 2019-02-25 DIAGNOSIS — Z89021 Acquired absence of right finger(s): Secondary | ICD-10-CM | POA: Diagnosis not present

## 2019-02-25 DIAGNOSIS — Z9981 Dependence on supplemental oxygen: Secondary | ICD-10-CM | POA: Diagnosis not present

## 2019-02-25 DIAGNOSIS — I272 Pulmonary hypertension, unspecified: Secondary | ICD-10-CM | POA: Diagnosis not present

## 2019-02-25 DIAGNOSIS — M349 Systemic sclerosis, unspecified: Secondary | ICD-10-CM | POA: Diagnosis not present

## 2019-02-25 DIAGNOSIS — Z79899 Other long term (current) drug therapy: Secondary | ICD-10-CM | POA: Insufficient documentation

## 2019-02-25 DIAGNOSIS — Z79890 Hormone replacement therapy (postmenopausal): Secondary | ICD-10-CM | POA: Diagnosis not present

## 2019-02-25 DIAGNOSIS — Z888 Allergy status to other drugs, medicaments and biological substances status: Secondary | ICD-10-CM | POA: Insufficient documentation

## 2019-02-25 DIAGNOSIS — Z8572 Personal history of non-Hodgkin lymphomas: Secondary | ICD-10-CM | POA: Insufficient documentation

## 2019-02-25 DIAGNOSIS — Z791 Long term (current) use of non-steroidal anti-inflammatories (NSAID): Secondary | ICD-10-CM | POA: Diagnosis not present

## 2019-02-25 DIAGNOSIS — Z9221 Personal history of antineoplastic chemotherapy: Secondary | ICD-10-CM | POA: Insufficient documentation

## 2019-02-25 DIAGNOSIS — I10 Essential (primary) hypertension: Secondary | ICD-10-CM | POA: Insufficient documentation

## 2019-02-25 MED ORDER — SPIRONOLACTONE 25 MG PO TABS: 25.0000 mg | ORAL_TABLET | Freq: Every day | ORAL | 1 refills | Status: DC

## 2019-02-25 NOTE — Patient Instructions (Signed)
Your physician wants you to follow-up in: 6 MONTHS You will receive a reminder letter in the mail two months in advance. If you don't receive a letter, please call our office to schedule the follow-up appointment.

## 2019-02-25 NOTE — Progress Notes (Signed)
PULMONARY HYPERTENSION CLINIC  [name], female   DOB: 17-Jan-1951, 68 y.o.   MRN: [id number] Primary Cardiologist: Dr. [name] Pulmonologist: Dr. Lamonte Sakai  HPI: [name] [name] is a 68 y.o. 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   PAH Meds: 1. started on IV epoprostenol (Veletri) in 01/2013. She had intolerable side effects with Veletri.  Admitted to [facility] 12/17/13 through 12/19 for Veletri wean and Tyvaso initiation. 2.  Stopped Tyvaso in 2015. 3. February 2016 switched bosentan to Macitentan 4. Now on Macitentan 10 and sildenafil 60 tid (did not tolerate Adcirca).  5. Selexipag added 5/18 - stopped due to SEs  She presents today for regular follow up. Feeling ok. Recently had R middle finger removed. Still with sores on other right fingers. Gets around with her O2 without problem. On macitentan 10 and sildenafil 60 tid.    for f/u: Had to stop selexipag due to side effects and flu-like symptoms. Remains on Macitentan 10 and sildenafil 40 tid. Doing well from [facility] standpoint. Breathing ok. No dizziness.  Still with some edema. But relatively well controlled on torsemide 40 bid. Struggling with recurrent wound on leg.  Still with severe neuropathy. Continues with wearing O2. Denies edema. Takes torsemide 40 bid. 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 Continues to follow with Dr. Dellia Nims for wounds on LLE. Getting cadaver skin grafts.   Echo 01/16/2019 LVEF 60-65%, PA peak pressure 58 mm Hg  Echo 12/18 EF 60-65% RV dilated with mildly to moderately decreased function   Studies   6 MW 04/18/12 880 feet 6 MW 11/10/12 650 feet. Limited by legs. Sats down to 85% on 5L (sats at rest 95%)  6 MW 3/15 915 feet (279 m) 6 MW 2/16 930 feet (283 m)  Echo 10/13 LV EF 65-70%. RV severely enlarged/HK severe TR.  RVSP 65mHg Echo (10/15) with EF 60-65%, D-shaped interventricular septum, moderately dilated RV with moderately decreased systolic function, PA systolic pressure 72 mmHg, IVC not dilated.  Echo (5/17) with EF 65-70%, Mildly flattened septum, severely dilated RV with moderately decreased systolic function, PA systolic pressure 68 mmHg, IVC not dilated.   RHC 04/19/17  RA = 4 RV = 80/8 PA = 79/17 (43) PCW = 6 Fick cardiac output/index = 4.1/2.4 PVR = 9.0 WU Ao sat = 94% PA sat = 59%, 62%  Assessment:  1. Moderate PAH   RHC 04/18/12  RA = 9  RV = 75/11/14  PA = 71/25 (44)  PCW = 4  Fick cardiac output/index = 3.0/1.7  Thermo CO/CI = 2.7/1.5  PVR = 13.3 Woods  FA sat = 79% on room air (checked 3 times). Sat with finger probe on air 94%  PA sat = 41%, 43%   RHC 11/14/12  RA = 14  RV = 84/8/20  PA = 83/29 (50)  PCW = 10  Fick cardiac output/index = 4.5/2.5  Thermo CO/CI = 3.8/2.2  PVR = 8.9 Woods (Fick)  FA sat = 86% on 3L  PA sat = 55%, 57  ABI 4/15: R 0.86 L 1.0   Labs (3/15): K 3.9, creatinine 1.0, LFTs normal Labs (05/17/14): K 3.4 Cr 1.1 Bun 40 LFTs ok  Labs (6/15): K 4.3, creatinine 1.15 Labs (8/15): creatinine 1.4 Labs (10/15): LFTs normal Labs (2/16): LFTs normal  Review of systems complete and  found to be negative unless listed in HPI.    Past Medical History  Diagnosis Date  . History of chicken pox   . Hypertension   . Sclerodermia generalized ([place])   . Non Hodgkin's lymphoma ([place])     Treated with chemo 2010, felt to be cured.  . Melanoma ([place]) 1999  . Peripheral arterial occlusive disease ([place])   . 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[place])   . DOE (dyspnea on exertion)   . PAH (pulmonary artery hypertension) ([place])   . Epistaxis   . Pulmonary hypertension ([place])    Current Outpatient Medications  Medication Sig Dispense Refill  . acetaminophen (TYLENOL ARTHRITIS PAIN) 650 MG CR tablet Take 1,300 mg by mouth every 8 (eight) hours.     Marland Kitchen atorvastatin  (LIPITOR) 10 MG tablet Take 1 tablet (10 mg total) by mouth daily. 90 tablet 3  . Biotin 5000 MCG TABS Take 5,000 mcg by mouth daily.    Marland Kitchen CALCIUM-MAGNESIUM-VITAMIN D PO Take 2 tablets by mouth daily.    . Cholecalciferol (VITAMIN D) 2000 units CAPS Take 2,000 Units by mouth daily.    . citalopram (CELEXA) 20 MG tablet TAKE 1 TABLET BY MOUTH EVERYDAY AT BEDTIME 90 tablet 3  . clopidogrel (PLAVIX) 75 MG tablet Take 1 tablet (75 mg total) by mouth daily. 90 tablet 3  . FERREX 150 150 MG capsule TAKE ONE CAPSULE BY MOUTH TWICE A DAY 180 capsule 3  . fish oil-omega-3 fatty acids 1000 MG capsule Take 1,000 mg by mouth 2 (two) times daily.     Marland Kitchen gabapentin (NEURONTIN) 300 MG capsule Take 600 mg by mouth 2 (two) times daily.    Marland Kitchen levothyroxine (SYNTHROID, LEVOTHROID) 50 MCG tablet TAKE 1 TABLET (50 MCG TOTAL) BY MOUTH DAILY BEFORE BREAKFAST. 90 tablet 1  . Multiple Vitamin (MULTIVITAMIN WITH MINERALS) TABS tablet Take 1 tablet by mouth daily.    . OPSUMIT 10 MG tablet Take 1 tablet (66m) by mouth daily. 30 tablet 0  . OXYGEN Inhale 2-3 L into the lungs continuous.     . pantoprazole (PROTONIX) 40 MG tablet Take 1 tablet (40 mg total) by mouth 2 (two) times daily. 60 tablet 1  . potassium chloride (K-DUR) 10 MEQ tablet TAKE 1 TABLET BY MOUTH EVERY DAY 90 tablet 1  . potassium chloride SA (K-DUR,KLOR-CON) 20 MEQ tablet Take 1 tablet (20 mEq total) by mouth daily. 30 tablet 3  . predniSONE (DELTASONE) 5 MG tablet Take 5 mg by mouth daily with breakfast.    . Probiotic CAPS Take 1 capsule by mouth daily.    . sildenafil (REVATIO) 20 MG tablet Take 3 tablets (60 mg total) by mouth 3 (three) times daily. 810 tablet 1  . spironolactone (ALDACTONE) 25 MG tablet Take 1 tablet (25 mg total) by mouth daily. 90 tablet 1  . torsemide (DEMADEX) 20 MG tablet Take 2 tablets (40 mg total) by mouth 2 (two) times daily. 360 tablet 3  . vitamin B-12 (CYANOCOBALAMIN) 1000 MCG tablet Take 1,000 mcg by mouth daily.     No  current facility-administered medications for this visit.     Allergies  Allergen Reactions  . Levofloxacin Other (See Comments)    FLU LIKE SYMPTOM Other reaction(s): Other (See Comments) FLU LIKE SYMPTOM  . Sulfa Antibiotics Rash and Other (See Comments)    Face peeled Severe rash with significant peeling of face. No airway involvement per patient Face peeled  . Levofloxacin In D5w Other (See Comments)  FLU LIKE SYMPTOMS FLU LIKE SYMPTOMS  . Doxycycline Hyclate Swelling    SWELLING REACTION UNSPECIFIED  "TABLETS ONLY - CAPSULES ARE TOLERATED FINE"  . Penicillins Rash    Has patient had a PCN reaction causing immediate rash, facial/tongue/throat swelling, SOB or lightheadedness with hypotension: no Has patient had a PCN reaction causing severe rash involving mucus membranes or skin necrosis: no Has patient had a PCN reaction that required hospitalization: no Has patient had a PCN reaction occurring within the last 10 years: {no If all of the above answers are "NO", then may proceed with Cephalosporin use.   There were no vitals filed for this visit.   Wt Readings from Last 3 Encounters:  02/17/19 62.6 kg (138 lb)  01/13/19 63.8 kg (140 lb 9.6 oz)  12/05/18 64.9 kg (143 lb)    PHYSICAL EXAM: General:  Frail appearing. No resp difficulty HEENT: normal Neck: supple. JVP 7-8. Carotids 2+ bilat; no bruits. No lymphadenopathy or thryomegaly appreciated. Cor: PMI nondisplaced. Regular rate & rhythm. 2/6 TR Lungs: minimal crackles  Abdomen: soft, nontender, nondistended. No hepatosplenomegaly. No bruits or masses. Good bowel sounds. Extremities: no cyanosis, clubbing, rash, 1-2+ edema (chronic) under compression stockings. S/p finger amputations on right Neuro: alert & orientedx3, cranial nerves grossly intact. moves all 4 extremities w/o difficulty. Affect pleasant   ASSESSMENT & PLAN:  1. 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  Unable to tolerate IV epoprostenol, Tyvaso or  selexipag.  She remains on Revatio and macitentan. - NYHA III  WHO class II-III dyspnea on macitentan 10 and revatio 60 tid -  RHC 4/18 as above -has now failed Selexipag as well as uptitration of her other PAH meds. Only other option would be to try to switch sildenafil to Adempas but will defer for now given intolerances. - Echo 12/18. Mild RV strain. Unable to estimate RVSP due to lack of significant TR  - Echo 01/16/2019 LVEF 60-65%, PA peak pressure 58 mm Hg. R looks normal (much improved) - Volume status mildly elevated but stable - Continue home oxygen.  - Unable to do 6MW due to sores 2. Scleroderma:  - Advanced. She is followed by Dr. Amil Amen. 3. Chronic L Ulceration: Followed by Wound Care.   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 Stable.  4. PAD with LE wounds: - Follows with [facility]. Continue Plavix - Seeing Dr. Dellia Nims at wound clinic.  5. Interstitial lung disease:  - Secondary to scleroderma.  - Stable on chronic O2.  - Followed by Dr. Lamonte Sakai  Recent chest CT reviewed and is stable  Glori Bickers, MD  4:44 PM

## 2019-02-27 DIAGNOSIS — H938X3 Other specified disorders of ear, bilateral: Secondary | ICD-10-CM | POA: Diagnosis not present

## 2019-02-27 DIAGNOSIS — Z7289 Other problems related to lifestyle: Secondary | ICD-10-CM | POA: Diagnosis not present

## 2019-02-27 DIAGNOSIS — J3489 Other specified disorders of nose and nasal sinuses: Secondary | ICD-10-CM | POA: Diagnosis not present

## 2019-02-27 DIAGNOSIS — Z9981 Dependence on supplemental oxygen: Secondary | ICD-10-CM | POA: Diagnosis not present

## 2019-02-27 DIAGNOSIS — H906 Mixed conductive and sensorineural hearing loss, bilateral: Secondary | ICD-10-CM | POA: Diagnosis not present

## 2019-02-27 DIAGNOSIS — H6521 Chronic serous otitis media, right ear: Secondary | ICD-10-CM | POA: Diagnosis not present

## 2019-02-27 DIAGNOSIS — H6981 Other specified disorders of Eustachian tube, right ear: Secondary | ICD-10-CM | POA: Diagnosis not present

## 2019-02-28 ENCOUNTER — Other Ambulatory Visit: Payer: Self-pay | Admitting: Internal Medicine

## 2019-03-02 ENCOUNTER — Encounter (HOSPITAL_BASED_OUTPATIENT_CLINIC_OR_DEPARTMENT_OTHER): Payer: Medicare Other | Attending: Internal Medicine

## 2019-03-02 DIAGNOSIS — I87311 Chronic venous hypertension (idiopathic) with ulcer of right lower extremity: Secondary | ICD-10-CM | POA: Diagnosis not present

## 2019-03-02 DIAGNOSIS — L97812 Non-pressure chronic ulcer of other part of right lower leg with fat layer exposed: Secondary | ICD-10-CM | POA: Insufficient documentation

## 2019-03-02 DIAGNOSIS — I739 Peripheral vascular disease, unspecified: Secondary | ICD-10-CM | POA: Insufficient documentation

## 2019-03-02 DIAGNOSIS — L97312 Non-pressure chronic ulcer of right ankle with fat layer exposed: Secondary | ICD-10-CM | POA: Diagnosis not present

## 2019-03-02 DIAGNOSIS — I10 Essential (primary) hypertension: Secondary | ICD-10-CM | POA: Diagnosis not present

## 2019-03-02 DIAGNOSIS — I87331 Chronic venous hypertension (idiopathic) with ulcer and inflammation of right lower extremity: Secondary | ICD-10-CM | POA: Insufficient documentation

## 2019-03-02 DIAGNOSIS — I7300 Raynaud's syndrome without gangrene: Secondary | ICD-10-CM | POA: Insufficient documentation

## 2019-03-02 DIAGNOSIS — M069 Rheumatoid arthritis, unspecified: Secondary | ICD-10-CM | POA: Insufficient documentation

## 2019-03-02 DIAGNOSIS — I8392 Asymptomatic varicose veins of left lower extremity: Secondary | ICD-10-CM | POA: Insufficient documentation

## 2019-03-02 DIAGNOSIS — L97212 Non-pressure chronic ulcer of right calf with fat layer exposed: Secondary | ICD-10-CM | POA: Diagnosis not present

## 2019-03-09 DIAGNOSIS — L97812 Non-pressure chronic ulcer of other part of right lower leg with fat layer exposed: Secondary | ICD-10-CM | POA: Diagnosis not present

## 2019-03-09 DIAGNOSIS — I739 Peripheral vascular disease, unspecified: Secondary | ICD-10-CM | POA: Diagnosis not present

## 2019-03-09 DIAGNOSIS — L97312 Non-pressure chronic ulcer of right ankle with fat layer exposed: Secondary | ICD-10-CM | POA: Diagnosis not present

## 2019-03-09 DIAGNOSIS — I87331 Chronic venous hypertension (idiopathic) with ulcer and inflammation of right lower extremity: Secondary | ICD-10-CM | POA: Diagnosis not present

## 2019-03-09 DIAGNOSIS — I87311 Chronic venous hypertension (idiopathic) with ulcer of right lower extremity: Secondary | ICD-10-CM | POA: Diagnosis not present

## 2019-03-09 DIAGNOSIS — I8392 Asymptomatic varicose veins of left lower extremity: Secondary | ICD-10-CM | POA: Diagnosis not present

## 2019-03-09 DIAGNOSIS — I10 Essential (primary) hypertension: Secondary | ICD-10-CM | POA: Diagnosis not present

## 2019-03-09 DIAGNOSIS — L97212 Non-pressure chronic ulcer of right calf with fat layer exposed: Secondary | ICD-10-CM | POA: Diagnosis not present

## 2019-03-10 ENCOUNTER — Other Ambulatory Visit (HOSPITAL_COMMUNITY): Payer: Self-pay | Admitting: Internal Medicine

## 2019-03-10 DIAGNOSIS — I779 Disorder of arteries and arterioles, unspecified: Secondary | ICD-10-CM

## 2019-03-11 ENCOUNTER — Other Ambulatory Visit (HOSPITAL_COMMUNITY): Payer: Self-pay | Admitting: Cardiology

## 2019-03-11 DIAGNOSIS — M349 Systemic sclerosis, unspecified: Secondary | ICD-10-CM | POA: Diagnosis not present

## 2019-03-16 ENCOUNTER — Telehealth: Payer: Self-pay | Admitting: Emergency Medicine

## 2019-03-16 NOTE — Telephone Encounter (Signed)
Spoke with pt, I advised her that we may have called to remind her of her appointment tomorrow with RB. Pt understood and stated she would be here for her appt. Nothing further is needed.

## 2019-03-17 ENCOUNTER — Ambulatory Visit: Payer: Medicare Other | Admitting: Emergency Medicine

## 2019-03-23 ENCOUNTER — Other Ambulatory Visit: Payer: Self-pay | Admitting: Internal Medicine

## 2019-03-23 DIAGNOSIS — I87331 Chronic venous hypertension (idiopathic) with ulcer and inflammation of right lower extremity: Secondary | ICD-10-CM | POA: Diagnosis not present

## 2019-03-23 DIAGNOSIS — L97812 Non-pressure chronic ulcer of other part of right lower leg with fat layer exposed: Secondary | ICD-10-CM | POA: Diagnosis not present

## 2019-03-23 DIAGNOSIS — I8392 Asymptomatic varicose veins of left lower extremity: Secondary | ICD-10-CM | POA: Diagnosis not present

## 2019-03-23 DIAGNOSIS — I872 Venous insufficiency (chronic) (peripheral): Secondary | ICD-10-CM | POA: Diagnosis not present

## 2019-03-23 DIAGNOSIS — I10 Essential (primary) hypertension: Secondary | ICD-10-CM | POA: Diagnosis not present

## 2019-03-23 DIAGNOSIS — I739 Peripheral vascular disease, unspecified: Secondary | ICD-10-CM | POA: Diagnosis not present

## 2019-03-23 DIAGNOSIS — L97312 Non-pressure chronic ulcer of right ankle with fat layer exposed: Secondary | ICD-10-CM | POA: Diagnosis not present

## 2019-04-10 ENCOUNTER — Other Ambulatory Visit (HOSPITAL_COMMUNITY): Payer: Self-pay

## 2019-04-10 DIAGNOSIS — I2729 Other secondary pulmonary hypertension: Secondary | ICD-10-CM

## 2019-04-10 MED ORDER — OPSUMIT 10 MG PO TABS: ORAL_TABLET | ORAL | 11 refills | Status: DC

## 2019-04-16 ENCOUNTER — Ambulatory Visit (INDEPENDENT_AMBULATORY_CARE_PROVIDER_SITE_OTHER): Payer: Medicare Other | Admitting: Internal Medicine

## 2019-04-16 ENCOUNTER — Other Ambulatory Visit: Payer: Self-pay

## 2019-04-16 ENCOUNTER — Encounter: Payer: Self-pay | Admitting: Internal Medicine

## 2019-04-16 ENCOUNTER — Telehealth: Payer: Self-pay | Admitting: Internal Medicine

## 2019-04-16 DIAGNOSIS — J0100 Acute maxillary sinusitis, unspecified: Secondary | ICD-10-CM

## 2019-04-16 DIAGNOSIS — M349 Systemic sclerosis, unspecified: Secondary | ICD-10-CM

## 2019-04-16 DIAGNOSIS — I779 Disorder of arteries and arterioles, unspecified: Secondary | ICD-10-CM

## 2019-04-16 DIAGNOSIS — I2729 Other secondary pulmonary hypertension: Secondary | ICD-10-CM

## 2019-04-16 NOTE — Telephone Encounter (Signed)
Done.

## 2019-04-16 NOTE — Patient Instructions (Signed)
Patient is to stay inside and rest at home.  Call if symptoms worsen.  Zithromax Z-PAK take 2 p.o. day 1 followed by 1 p.o. days 2 through 5.  Monitor pulse oximetry at home closely

## 2019-04-16 NOTE — Telephone Encounter (Signed)
[name] [name] [phone]  [name] called to say that she is having some drainage that is causing her throat to be sore at times. No fever, no contact, no travel, she wanted to call before this has a chance to get worse.

## 2019-04-16 NOTE — Telephone Encounter (Signed)
Schedule virtual visit

## 2019-04-16 NOTE — Progress Notes (Addendum)
   Subjective:    [name], female    DOB: 1951-02-14, 68 y.o.   MRN: [id number]  HPI 67 year old Female with history of scleroderma, interstitial lung disease and pulmonary hypertension on chronic oxygen therapy called with complaint of sore throat onset yesterday.  Postnasal drip.  Denies cough.  Denies fever.  She has multiple drug intolerances including levofloxacin, sulfa, doxycycline and penicillins.  She is under the care of rheumatologist for scleroderma.  She says she feels pretty well and has not had any travel history.  Husband rides her around in the car some so she can get out but has not been in any stores.  Denies any shaking chills.  No nausea or vomiting.  Due to the Coronavirus pandemic she is seen by interactive audio and video telecommunications today.  She consents to visit in this format.  Interactive audio and video telecommunications were achieved between this provider and patient without difficulty.  Review of Systems see above     Objective:   Physical Exam  Reports that she is afebrile.  Seen by virtual office visit.  2 identifiers used.  Husband is present with her.  She is able to give a clear concise history without his assistance. She says her pulse oximetry is stable and within normal limits.  See above for history  20 minutes spent reviewing records, medical decision making  , Prescribing medication and documentation Assessment & Plan:  Acute nonrecurrent maxillary sinusitis  Scleroderma  Pulmonary hypertension associated with systemic disorder  Plan: Zithromax Z-PAK take 2 p.o. day 1 followed by 1 p.o. days 2 through 5 with no refill.  She is to monitor her symptoms closely and stay inside.  Call if symptoms worsen.  Monitor pulse oximetry at home closely.

## 2019-04-17 ENCOUNTER — Ambulatory Visit: Payer: Medicare Other | Admitting: Internal Medicine

## 2019-04-27 DIAGNOSIS — S81801A Unspecified open wound, right lower leg, initial encounter: Secondary | ICD-10-CM | POA: Diagnosis not present

## 2019-04-27 DIAGNOSIS — S91001A Unspecified open wound, right ankle, initial encounter: Secondary | ICD-10-CM | POA: Diagnosis not present

## 2019-04-29 DIAGNOSIS — D638 Anemia in other chronic diseases classified elsewhere: Secondary | ICD-10-CM | POA: Diagnosis not present

## 2019-04-29 DIAGNOSIS — M79672 Pain in left foot: Secondary | ICD-10-CM | POA: Diagnosis not present

## 2019-04-29 DIAGNOSIS — M810 Age-related osteoporosis without current pathological fracture: Secondary | ICD-10-CM | POA: Diagnosis not present

## 2019-04-29 DIAGNOSIS — S81801D Unspecified open wound, right lower leg, subsequent encounter: Secondary | ICD-10-CM | POA: Diagnosis not present

## 2019-04-29 DIAGNOSIS — M340 Progressive systemic sclerosis: Secondary | ICD-10-CM | POA: Diagnosis not present

## 2019-04-29 DIAGNOSIS — I7301 Raynaud's syndrome with gangrene: Secondary | ICD-10-CM | POA: Diagnosis not present

## 2019-04-29 DIAGNOSIS — G629 Polyneuropathy, unspecified: Secondary | ICD-10-CM | POA: Diagnosis not present

## 2019-04-29 DIAGNOSIS — K219 Gastro-esophageal reflux disease without esophagitis: Secondary | ICD-10-CM | POA: Diagnosis not present

## 2019-04-30 NOTE — Progress Notes (Signed)
This has been done. Thank you

## 2019-05-11 DIAGNOSIS — L940 Localized scleroderma [morphea]: Secondary | ICD-10-CM | POA: Diagnosis not present

## 2019-05-11 DIAGNOSIS — I272 Pulmonary hypertension, unspecified: Secondary | ICD-10-CM | POA: Diagnosis not present

## 2019-05-12 ENCOUNTER — Ambulatory Visit: Payer: Medicare Other | Admitting: Emergency Medicine

## 2019-05-26 DIAGNOSIS — I87309 Chronic venous hypertension (idiopathic) without complications of unspecified lower extremity: Secondary | ICD-10-CM | POA: Diagnosis not present

## 2019-05-26 DIAGNOSIS — L97812 Non-pressure chronic ulcer of other part of right lower leg with fat layer exposed: Secondary | ICD-10-CM | POA: Diagnosis not present

## 2019-05-26 DIAGNOSIS — L97312 Non-pressure chronic ulcer of right ankle with fat layer exposed: Secondary | ICD-10-CM | POA: Diagnosis not present

## 2019-05-26 DIAGNOSIS — L97815 Non-pressure chronic ulcer of other part of right lower leg with muscle involvement without evidence of necrosis: Secondary | ICD-10-CM | POA: Diagnosis not present

## 2019-06-06 ENCOUNTER — Other Ambulatory Visit (HOSPITAL_COMMUNITY): Payer: Self-pay | Admitting: Internal Medicine

## 2019-06-08 DIAGNOSIS — S91001A Unspecified open wound, right ankle, initial encounter: Secondary | ICD-10-CM | POA: Diagnosis not present

## 2019-06-08 DIAGNOSIS — S81801A Unspecified open wound, right lower leg, initial encounter: Secondary | ICD-10-CM | POA: Diagnosis not present

## 2019-06-09 ENCOUNTER — Other Ambulatory Visit: Payer: Medicare Other | Admitting: Internal Medicine

## 2019-06-11 ENCOUNTER — Ambulatory Visit: Payer: Medicare Other | Admitting: Internal Medicine

## 2019-06-15 ENCOUNTER — Other Ambulatory Visit: Payer: Medicare Other | Admitting: Internal Medicine

## 2019-06-16 ENCOUNTER — Other Ambulatory Visit: Payer: Medicare Other | Admitting: Internal Medicine

## 2019-06-16 ENCOUNTER — Other Ambulatory Visit: Payer: Self-pay

## 2019-06-16 DIAGNOSIS — E7849 Other hyperlipidemia: Secondary | ICD-10-CM

## 2019-06-16 DIAGNOSIS — I10 Essential (primary) hypertension: Secondary | ICD-10-CM | POA: Diagnosis not present

## 2019-06-16 DIAGNOSIS — M349 Systemic sclerosis, unspecified: Secondary | ICD-10-CM | POA: Diagnosis not present

## 2019-06-16 LAB — CBC WITH DIFFERENTIAL/PLATELET
Absolute Monocytes: 537 cells/uL (ref 200–950)
Basophils Absolute: 21 cells/uL (ref 0–200)
Basophils Relative: 0.5 %
Eosinophils Absolute: 21 cells/uL (ref 15–500)
Eosinophils Relative: 0.5 %
HCT: 32.7 % — ABNORMAL LOW (ref 35.0–45.0)
Hemoglobin: 10.9 g/dL — ABNORMAL LOW (ref 11.7–15.5)
Lymphs Abs: 976 cells/uL (ref 850–3900)
MCH: 33.4 pg — ABNORMAL HIGH (ref 27.0–33.0)
MCHC: 33.3 g/dL (ref 32.0–36.0)
MCV: 100.3 fL — ABNORMAL HIGH (ref 80.0–100.0)
MPV: 9.6 fL (ref 7.5–12.5)
Monocytes Relative: 13.1 %
Neutro Abs: 2546 cells/uL (ref 1500–7800)
Neutrophils Relative %: 62.1 %
Platelets: 260 10*3/uL (ref 140–400)
RBC: 3.26 10*6/uL — ABNORMAL LOW (ref 3.80–5.10)
RDW: 11.7 % (ref 11.0–15.0)
Total Lymphocyte: 23.8 %
WBC: 4.1 10*3/uL (ref 3.8–10.8)

## 2019-06-16 LAB — LIPID PANEL
Cholesterol: 221 mg/dL — ABNORMAL HIGH (ref <200)
HDL: 69 mg/dL (ref >=50)
LDL Cholesterol (Calc): 126 mg/dL (calc) — ABNORMAL HIGH
Non-HDL Cholesterol (Calc): 152 mg/dL (calc) — ABNORMAL HIGH (ref <130)
Total CHOL/HDL Ratio: 3.2 (calc) (ref <5.0)
Triglycerides: 144 mg/dL (ref <150)

## 2019-06-18 ENCOUNTER — Other Ambulatory Visit: Payer: Self-pay

## 2019-06-18 ENCOUNTER — Encounter: Payer: Self-pay | Admitting: Internal Medicine

## 2019-06-18 ENCOUNTER — Ambulatory Visit (INDEPENDENT_AMBULATORY_CARE_PROVIDER_SITE_OTHER): Payer: Medicare Other | Admitting: Internal Medicine

## 2019-06-18 VITALS — BP 144/60 | HR 79 | Temp 97.5°F | Resp 16 | Ht 67.5 in | Wt 143.0 lb

## 2019-06-18 DIAGNOSIS — M810 Age-related osteoporosis without current pathological fracture: Secondary | ICD-10-CM

## 2019-06-18 DIAGNOSIS — E782 Mixed hyperlipidemia: Secondary | ICD-10-CM

## 2019-06-18 DIAGNOSIS — Z9981 Dependence on supplemental oxygen: Secondary | ICD-10-CM

## 2019-06-18 DIAGNOSIS — D649 Anemia, unspecified: Secondary | ICD-10-CM | POA: Diagnosis not present

## 2019-06-18 DIAGNOSIS — Z8719 Personal history of other diseases of the digestive system: Secondary | ICD-10-CM

## 2019-06-18 DIAGNOSIS — I2729 Other secondary pulmonary hypertension: Secondary | ICD-10-CM

## 2019-06-18 DIAGNOSIS — E876 Hypokalemia: Secondary | ICD-10-CM | POA: Diagnosis not present

## 2019-06-18 DIAGNOSIS — M349 Systemic sclerosis, unspecified: Secondary | ICD-10-CM

## 2019-06-18 DIAGNOSIS — I779 Disorder of arteries and arterioles, unspecified: Secondary | ICD-10-CM

## 2019-06-18 DIAGNOSIS — E039 Hypothyroidism, unspecified: Secondary | ICD-10-CM | POA: Diagnosis not present

## 2019-06-18 DIAGNOSIS — Z7901 Long term (current) use of anticoagulants: Secondary | ICD-10-CM | POA: Diagnosis not present

## 2019-06-18 DIAGNOSIS — I10 Essential (primary) hypertension: Secondary | ICD-10-CM | POA: Diagnosis not present

## 2019-06-18 MED ORDER — ATORVASTATIN CALCIUM 20 MG PO TABS: 20.0000 mg | ORAL_TABLET | Freq: Every day | ORAL | 3 refills | Status: DC

## 2019-06-18 NOTE — Progress Notes (Signed)
Subjective:    [name], female    DOB: [birth year], 68 y.o.   MRN: [id number]  HPI She is here today for her six-month follow-up appointment.  She has a history of osteoporosis treated with Prolia.  She has scleroderma and is followed by rheumatologist.  Total cholesterol was 221 and LDL cholesterol 126.  In December lipids panel showed triglycerides of 162 and LDL of 102.  She is on Lipitor 20 mg daily.  Probably not as much exercise and dietary discretion during the pandemic.  History of normocytic anemia.  Hemoglobin is 10.9 g.  History of chronic kidney disease with elevated creatinine 1.41.  History of hypothyroidism treated with low-dose levothyroxine.  Has had issues with hypokalemia on diuretic and currently is on K. Dur    Review of Systems depression stable with pandemic.  No new complaints.     Objective:   Physical Exam  Vital signs reviewed.  Neck is supple.  No thyromegaly.  Chest clear.  Cardiac exam regular rate and rhythm.  Extremities without pitting edema.  Wearing chronic oxygen therapy.  Alert and oriented.      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  History of normocytic anemia-check CBC at next visit.  Last CBC was December 2019.  Chronic anticoagulation with aspirin and Plavix  Plan: Follow-up in July with lipid panel, liver functions and CBC with office  visit.  Will be time for Prolia at that point.  25 minutes spent with patient including medical decision making and evaluation of multiple medical problems.  Needs to make sure she is taking lipid-lowering medication.

## 2019-06-22 ENCOUNTER — Encounter (HOSPITAL_BASED_OUTPATIENT_CLINIC_OR_DEPARTMENT_OTHER): Payer: Medicare Other | Attending: Internal Medicine

## 2019-06-22 DIAGNOSIS — L97215 Non-pressure chronic ulcer of right calf with muscle involvement without evidence of necrosis: Secondary | ICD-10-CM | POA: Diagnosis not present

## 2019-06-22 DIAGNOSIS — L97812 Non-pressure chronic ulcer of other part of right lower leg with fat layer exposed: Secondary | ICD-10-CM | POA: Diagnosis not present

## 2019-06-22 DIAGNOSIS — L97312 Non-pressure chronic ulcer of right ankle with fat layer exposed: Secondary | ICD-10-CM | POA: Insufficient documentation

## 2019-06-22 DIAGNOSIS — Z87891 Personal history of nicotine dependence: Secondary | ICD-10-CM | POA: Insufficient documentation

## 2019-06-22 DIAGNOSIS — Z9981 Dependence on supplemental oxygen: Secondary | ICD-10-CM | POA: Diagnosis not present

## 2019-06-22 DIAGNOSIS — I10 Essential (primary) hypertension: Secondary | ICD-10-CM | POA: Diagnosis not present

## 2019-06-22 DIAGNOSIS — I87331 Chronic venous hypertension (idiopathic) with ulcer and inflammation of right lower extremity: Secondary | ICD-10-CM | POA: Diagnosis not present

## 2019-06-22 DIAGNOSIS — L97815 Non-pressure chronic ulcer of other part of right lower leg with muscle involvement without evidence of necrosis: Secondary | ICD-10-CM | POA: Diagnosis not present

## 2019-06-22 DIAGNOSIS — I272 Pulmonary hypertension, unspecified: Secondary | ICD-10-CM | POA: Insufficient documentation

## 2019-06-25 ENCOUNTER — Telehealth: Payer: Self-pay | Admitting: Internal Medicine

## 2019-06-25 NOTE — Telephone Encounter (Signed)
Called pharmacy and spoke with pharmacist. They are going to give her a small capsule. She will take 2 daily

## 2019-06-25 NOTE — Telephone Encounter (Signed)
[name] [name] [phone]  [facility]  [address] called to say she had talked with the pharmacist and they stated she would need a prescription to get potasium in liquid or capsule form.

## 2019-06-29 ENCOUNTER — Telehealth (HOSPITAL_COMMUNITY): Payer: Self-pay

## 2019-06-29 NOTE — Telephone Encounter (Signed)
recert for oxygen was faxed over to [facility] oxygen  06/26/2019.

## 2019-07-06 ENCOUNTER — Encounter (HOSPITAL_BASED_OUTPATIENT_CLINIC_OR_DEPARTMENT_OTHER): Payer: Medicare Other | Attending: Internal Medicine

## 2019-07-06 DIAGNOSIS — M349 Systemic sclerosis, unspecified: Secondary | ICD-10-CM | POA: Insufficient documentation

## 2019-07-06 DIAGNOSIS — Z9981 Dependence on supplemental oxygen: Secondary | ICD-10-CM | POA: Insufficient documentation

## 2019-07-06 DIAGNOSIS — L97812 Non-pressure chronic ulcer of other part of right lower leg with fat layer exposed: Secondary | ICD-10-CM | POA: Insufficient documentation

## 2019-07-06 DIAGNOSIS — L97312 Non-pressure chronic ulcer of right ankle with fat layer exposed: Secondary | ICD-10-CM | POA: Insufficient documentation

## 2019-07-06 DIAGNOSIS — I272 Pulmonary hypertension, unspecified: Secondary | ICD-10-CM | POA: Insufficient documentation

## 2019-07-06 DIAGNOSIS — I10 Essential (primary) hypertension: Secondary | ICD-10-CM | POA: Insufficient documentation

## 2019-07-08 DIAGNOSIS — M25541 Pain in joints of right hand: Secondary | ICD-10-CM | POA: Diagnosis not present

## 2019-07-08 DIAGNOSIS — I7300 Raynaud's syndrome without gangrene: Secondary | ICD-10-CM | POA: Diagnosis not present

## 2019-07-08 DIAGNOSIS — M25542 Pain in joints of left hand: Secondary | ICD-10-CM | POA: Diagnosis not present

## 2019-07-08 DIAGNOSIS — Z89022 Acquired absence of left finger(s): Secondary | ICD-10-CM | POA: Diagnosis not present

## 2019-07-08 DIAGNOSIS — Z89021 Acquired absence of right finger(s): Secondary | ICD-10-CM | POA: Diagnosis not present

## 2019-07-08 DIAGNOSIS — M6249 Contracture of muscle, multiple sites: Secondary | ICD-10-CM | POA: Diagnosis not present

## 2019-07-08 DIAGNOSIS — M349 Systemic sclerosis, unspecified: Secondary | ICD-10-CM | POA: Diagnosis not present

## 2019-07-10 ENCOUNTER — Other Ambulatory Visit: Payer: Self-pay | Admitting: Internal Medicine

## 2019-07-13 DIAGNOSIS — Z9981 Dependence on supplemental oxygen: Secondary | ICD-10-CM | POA: Diagnosis not present

## 2019-07-13 DIAGNOSIS — M349 Systemic sclerosis, unspecified: Secondary | ICD-10-CM | POA: Diagnosis not present

## 2019-07-13 DIAGNOSIS — L940 Localized scleroderma [morphea]: Secondary | ICD-10-CM | POA: Diagnosis not present

## 2019-07-13 DIAGNOSIS — L97312 Non-pressure chronic ulcer of right ankle with fat layer exposed: Secondary | ICD-10-CM | POA: Diagnosis not present

## 2019-07-13 DIAGNOSIS — L97812 Non-pressure chronic ulcer of other part of right lower leg with fat layer exposed: Secondary | ICD-10-CM | POA: Diagnosis not present

## 2019-07-13 DIAGNOSIS — I272 Pulmonary hypertension, unspecified: Secondary | ICD-10-CM | POA: Diagnosis not present

## 2019-07-13 DIAGNOSIS — I10 Essential (primary) hypertension: Secondary | ICD-10-CM | POA: Diagnosis not present

## 2019-07-13 DIAGNOSIS — L97215 Non-pressure chronic ulcer of right calf with muscle involvement without evidence of necrosis: Secondary | ICD-10-CM | POA: Diagnosis not present

## 2019-07-17 ENCOUNTER — Ambulatory Visit (INDEPENDENT_AMBULATORY_CARE_PROVIDER_SITE_OTHER): Payer: Medicare Other | Admitting: Internal Medicine

## 2019-07-17 ENCOUNTER — Other Ambulatory Visit: Payer: Medicare Other | Admitting: Internal Medicine

## 2019-07-17 ENCOUNTER — Other Ambulatory Visit: Payer: Self-pay

## 2019-07-17 ENCOUNTER — Encounter: Payer: Self-pay | Admitting: Internal Medicine

## 2019-07-17 VITALS — BP 100/60 | HR 64 | Temp 98.4°F | Ht 67.5 in | Wt 141.0 lb

## 2019-07-17 DIAGNOSIS — I779 Disorder of arteries and arterioles, unspecified: Secondary | ICD-10-CM | POA: Diagnosis not present

## 2019-07-17 DIAGNOSIS — M810 Age-related osteoporosis without current pathological fracture: Secondary | ICD-10-CM

## 2019-07-17 MED ORDER — DENOSUMAB 60 MG/ML ~~LOC~~ SOSY
60.0000 mg | PREFILLED_SYRINGE | Freq: Once | SUBCUTANEOUS | Status: AC
  Administered 2019-07-17: 60 mg via SUBCUTANEOUS

## 2019-07-17 NOTE — Progress Notes (Signed)
Prolia injection given by CMA. Repeat in 6 months

## 2019-07-17 NOTE — Patient Instructions (Addendum)
Prolia injection given next dose in 6 months.

## 2019-07-18 ENCOUNTER — Encounter: Payer: Self-pay | Admitting: Internal Medicine

## 2019-07-18 NOTE — Patient Instructions (Signed)
Follow-up in July with lipid panel, liver functions, and CBC along with Prolia treatment.

## 2019-07-21 ENCOUNTER — Other Ambulatory Visit: Payer: Self-pay

## 2019-07-21 ENCOUNTER — Other Ambulatory Visit: Payer: Medicare Other | Admitting: Internal Medicine

## 2019-07-21 ENCOUNTER — Ambulatory Visit: Payer: Medicare Other | Admitting: Emergency Medicine

## 2019-07-21 DIAGNOSIS — E785 Hyperlipidemia, unspecified: Secondary | ICD-10-CM | POA: Diagnosis not present

## 2019-07-21 DIAGNOSIS — D649 Anemia, unspecified: Secondary | ICD-10-CM

## 2019-07-22 DIAGNOSIS — L570 Actinic keratosis: Secondary | ICD-10-CM | POA: Diagnosis not present

## 2019-07-22 DIAGNOSIS — Z1283 Encounter for screening for malignant neoplasm of skin: Secondary | ICD-10-CM | POA: Diagnosis not present

## 2019-07-22 DIAGNOSIS — Z85820 Personal history of malignant melanoma of skin: Secondary | ICD-10-CM | POA: Diagnosis not present

## 2019-07-22 DIAGNOSIS — Z08 Encounter for follow-up examination after completed treatment for malignant neoplasm: Secondary | ICD-10-CM | POA: Diagnosis not present

## 2019-07-22 DIAGNOSIS — X32XXXA Exposure to sunlight, initial encounter: Secondary | ICD-10-CM | POA: Diagnosis not present

## 2019-07-22 LAB — HEPATIC FUNCTION PANEL
AG Ratio: 1.6 (calc) (ref 1.0–2.5)
ALT: 22 U/L (ref 6–29)
AST: 28 U/L (ref 10–35)
Albumin: 4.1 g/dL (ref 3.6–5.1)
Alkaline phosphatase (APISO): 85 U/L (ref 37–153)
Bilirubin, Direct: 0.1 mg/dL (ref 0.0–0.2)
Globulin: 2.6 g/dL (calc) (ref 1.9–3.7)
Indirect Bilirubin: 0.4 mg/dL (calc) (ref 0.2–1.2)
Total Bilirubin: 0.5 mg/dL (ref 0.2–1.2)
Total Protein: 6.7 g/dL (ref 6.1–8.1)

## 2019-07-22 LAB — CBC WITH DIFFERENTIAL/PLATELET
Absolute Monocytes: 418 cells/uL (ref 200–950)
Basophils Absolute: 22 cells/uL (ref 0–200)
Basophils Relative: 0.4 %
Eosinophils Absolute: 28 cells/uL (ref 15–500)
Eosinophils Relative: 0.5 %
HCT: 34.1 % — ABNORMAL LOW (ref 35.0–45.0)
Hemoglobin: 11.4 g/dL — ABNORMAL LOW (ref 11.7–15.5)
Lymphs Abs: 1106 cells/uL (ref 850–3900)
MCH: 34.3 pg — ABNORMAL HIGH (ref 27.0–33.0)
MCHC: 33.4 g/dL (ref 32.0–36.0)
MCV: 102.7 fL — ABNORMAL HIGH (ref 80.0–100.0)
MPV: 9.7 fL (ref 7.5–12.5)
Monocytes Relative: 7.6 %
Neutro Abs: 3927 cells/uL (ref 1500–7800)
Neutrophils Relative %: 71.4 %
Platelets: 322 10*3/uL (ref 140–400)
RBC: 3.32 10*6/uL — ABNORMAL LOW (ref 3.80–5.10)
RDW: 11.8 % (ref 11.0–15.0)
Total Lymphocyte: 20.1 %
WBC: 5.5 10*3/uL (ref 3.8–10.8)

## 2019-07-22 LAB — LIPID PANEL
Cholesterol: 199 mg/dL (ref <200)
HDL: 62 mg/dL (ref >=50)
LDL Cholesterol (Calc): 105 mg/dL (calc) — ABNORMAL HIGH
Non-HDL Cholesterol (Calc): 137 mg/dL (calc) — ABNORMAL HIGH (ref <130)
Total CHOL/HDL Ratio: 3.2 (calc) (ref <5.0)
Triglycerides: 197 mg/dL — ABNORMAL HIGH (ref <150)

## 2019-07-24 ENCOUNTER — Encounter: Payer: Self-pay | Admitting: Internal Medicine

## 2019-07-24 ENCOUNTER — Other Ambulatory Visit: Payer: Self-pay

## 2019-07-24 ENCOUNTER — Ambulatory Visit (INDEPENDENT_AMBULATORY_CARE_PROVIDER_SITE_OTHER): Payer: Medicare Other | Admitting: Internal Medicine

## 2019-07-24 VITALS — BP 120/60 | HR 88 | Temp 98.0°F | Ht 67.5 in | Wt 139.0 lb

## 2019-07-24 DIAGNOSIS — E876 Hypokalemia: Secondary | ICD-10-CM | POA: Diagnosis not present

## 2019-07-24 DIAGNOSIS — E782 Mixed hyperlipidemia: Secondary | ICD-10-CM | POA: Diagnosis not present

## 2019-07-24 DIAGNOSIS — H903 Sensorineural hearing loss, bilateral: Secondary | ICD-10-CM

## 2019-07-24 DIAGNOSIS — M810 Age-related osteoporosis without current pathological fracture: Secondary | ICD-10-CM | POA: Diagnosis not present

## 2019-07-24 DIAGNOSIS — Z7901 Long term (current) use of anticoagulants: Secondary | ICD-10-CM

## 2019-07-24 DIAGNOSIS — M349 Systemic sclerosis, unspecified: Secondary | ICD-10-CM | POA: Diagnosis not present

## 2019-07-24 DIAGNOSIS — I779 Disorder of arteries and arterioles, unspecified: Secondary | ICD-10-CM | POA: Diagnosis not present

## 2019-07-24 DIAGNOSIS — I10 Essential (primary) hypertension: Secondary | ICD-10-CM

## 2019-07-24 DIAGNOSIS — Z9981 Dependence on supplemental oxygen: Secondary | ICD-10-CM | POA: Diagnosis not present

## 2019-07-24 DIAGNOSIS — I2729 Other secondary pulmonary hypertension: Secondary | ICD-10-CM | POA: Diagnosis not present

## 2019-07-24 DIAGNOSIS — E039 Hypothyroidism, unspecified: Secondary | ICD-10-CM | POA: Diagnosis not present

## 2019-07-24 DIAGNOSIS — D649 Anemia, unspecified: Secondary | ICD-10-CM

## 2019-07-27 DIAGNOSIS — S91001A Unspecified open wound, right ankle, initial encounter: Secondary | ICD-10-CM | POA: Diagnosis not present

## 2019-07-27 DIAGNOSIS — L97312 Non-pressure chronic ulcer of right ankle with fat layer exposed: Secondary | ICD-10-CM | POA: Diagnosis not present

## 2019-07-27 DIAGNOSIS — Z9981 Dependence on supplemental oxygen: Secondary | ICD-10-CM | POA: Diagnosis not present

## 2019-07-27 DIAGNOSIS — I272 Pulmonary hypertension, unspecified: Secondary | ICD-10-CM | POA: Diagnosis not present

## 2019-07-27 DIAGNOSIS — I10 Essential (primary) hypertension: Secondary | ICD-10-CM | POA: Diagnosis not present

## 2019-07-27 DIAGNOSIS — S81801A Unspecified open wound, right lower leg, initial encounter: Secondary | ICD-10-CM | POA: Diagnosis not present

## 2019-07-27 DIAGNOSIS — L97812 Non-pressure chronic ulcer of other part of right lower leg with fat layer exposed: Secondary | ICD-10-CM | POA: Diagnosis not present

## 2019-07-27 DIAGNOSIS — M349 Systemic sclerosis, unspecified: Secondary | ICD-10-CM | POA: Diagnosis not present

## 2019-07-28 ENCOUNTER — Encounter: Payer: Self-pay | Admitting: Internal Medicine

## 2019-07-28 NOTE — Patient Instructions (Signed)
Switch potassium from tablet formation to liquid formation.  Follow-up in 6 months.  Have bone density study in 6 months.  Recently received Prolia injection for osteoporosis.

## 2019-07-28 NOTE — Progress Notes (Signed)
   Subjective:    [name], female    DOB: 11-26-1951, 68 y.o.   MRN: [id number]  HPI 35-monthrecheck on mixed hyperlipidemia, mild anemia, scleroderma, osteoporosis, chronic kidney disease stage III, hypothyroidism.  History of depression.  Hypokalemia secondary to diuretic therapy treated with potassium supplement.  Chronic respiratory failure and interstitial lung disease consistent with scleroderma requiring oxygen therapy.  History of GI bleed treated with PPI.  Chronic anticoagulation with aspirin and Plavix.  Seems to be doing fairly well during the pandemic.  She recently received Prolia injection.  She does not tolerate her current potassium supplement well and would like a liquid supplement.  We will make that change.  Currently on Lipitor 40 mg daily.  Hemoglobin is stable with iron supplementation.  Scleroderma is followed by rheumatologist.  History of chronic kidney disease with creatinine generally around 1.41 range.  Hemoglobin stable at 10.9 g.  Triglycerides are elevated at 197, LDL cholesterol 105 and total cholesterol 199.  Improved from June when total cholesterol was 221 with an LDL of 126 but triglycerides have increased from 1 44-1 97. Had T score 2018 lowest -2.9.  Needs to be repeated.   Review of Systems     Objective:   Physical Exam  Chest clear.  Cardiac exam regular rate and rhythm.  Extremities without edema.  Affect is normal.      Assessment & Plan:  Scleroderma followed by rheumatologist with severe pulmonary hypertension  Chronic respiratory failure and interstitial lung disease requiring oxygen therapy followed by pulmonary  Ascitic anemia-stable on iron treatment  Essential hypertension-stable  History of depression treated with Celexa  History of tricuspid regurgitation and severe pulmonary hypertension  Hyperlipidemia treated with Lipitor female doses 40 mg daily.  Osteoporosis treated with Prolia  Hypokalemia secondary  to diuretic therapy treated with potassium supplement  Chronic anticoagulation with aspirin and Plavix  History of diastolic dysfunction  History of chronic kidney disease stage III  Hypothyroidism treated with thyroid replacement  GE reflux treated with PPI  Plan: Switch to liquid potassium supplement and husband is tolerated better than the oral tablet.  Due for Prolia in 6 months.  Due for bone density study in 6 months.

## 2019-07-30 DIAGNOSIS — S81801D Unspecified open wound, right lower leg, subsequent encounter: Secondary | ICD-10-CM | POA: Diagnosis not present

## 2019-07-30 DIAGNOSIS — K219 Gastro-esophageal reflux disease without esophagitis: Secondary | ICD-10-CM | POA: Diagnosis not present

## 2019-07-30 DIAGNOSIS — I7301 Raynaud's syndrome with gangrene: Secondary | ICD-10-CM | POA: Diagnosis not present

## 2019-07-30 DIAGNOSIS — M340 Progressive systemic sclerosis: Secondary | ICD-10-CM | POA: Diagnosis not present

## 2019-07-30 DIAGNOSIS — M810 Age-related osteoporosis without current pathological fracture: Secondary | ICD-10-CM | POA: Diagnosis not present

## 2019-07-30 DIAGNOSIS — M79672 Pain in left foot: Secondary | ICD-10-CM | POA: Diagnosis not present

## 2019-07-30 DIAGNOSIS — I272 Pulmonary hypertension, unspecified: Secondary | ICD-10-CM | POA: Diagnosis not present

## 2019-07-30 DIAGNOSIS — D638 Anemia in other chronic diseases classified elsewhere: Secondary | ICD-10-CM | POA: Diagnosis not present

## 2019-07-30 DIAGNOSIS — G629 Polyneuropathy, unspecified: Secondary | ICD-10-CM | POA: Diagnosis not present

## 2019-08-15 ENCOUNTER — Other Ambulatory Visit (HOSPITAL_COMMUNITY): Payer: Self-pay | Admitting: Internal Medicine

## 2019-08-17 ENCOUNTER — Encounter (HOSPITAL_BASED_OUTPATIENT_CLINIC_OR_DEPARTMENT_OTHER): Payer: Medicare Other | Attending: Internal Medicine

## 2019-08-17 ENCOUNTER — Encounter (HOSPITAL_BASED_OUTPATIENT_CLINIC_OR_DEPARTMENT_OTHER): Payer: Self-pay

## 2019-08-17 DIAGNOSIS — I10 Essential (primary) hypertension: Secondary | ICD-10-CM | POA: Insufficient documentation

## 2019-08-17 DIAGNOSIS — L97312 Non-pressure chronic ulcer of right ankle with fat layer exposed: Secondary | ICD-10-CM | POA: Diagnosis not present

## 2019-08-17 DIAGNOSIS — L97812 Non-pressure chronic ulcer of other part of right lower leg with fat layer exposed: Secondary | ICD-10-CM | POA: Insufficient documentation

## 2019-08-17 DIAGNOSIS — I87331 Chronic venous hypertension (idiopathic) with ulcer and inflammation of right lower extremity: Secondary | ICD-10-CM | POA: Diagnosis not present

## 2019-08-17 DIAGNOSIS — L97212 Non-pressure chronic ulcer of right calf with fat layer exposed: Secondary | ICD-10-CM | POA: Diagnosis not present

## 2019-08-17 DIAGNOSIS — I87311 Chronic venous hypertension (idiopathic) with ulcer of right lower extremity: Secondary | ICD-10-CM | POA: Diagnosis not present

## 2019-08-25 ENCOUNTER — Ambulatory Visit: Payer: Medicare Other | Admitting: Emergency Medicine

## 2019-08-31 ENCOUNTER — Encounter: Payer: Self-pay | Admitting: Emergency Medicine

## 2019-08-31 ENCOUNTER — Other Ambulatory Visit: Payer: Self-pay

## 2019-08-31 ENCOUNTER — Ambulatory Visit (INDEPENDENT_AMBULATORY_CARE_PROVIDER_SITE_OTHER): Payer: Medicare Other | Admitting: Emergency Medicine

## 2019-08-31 DIAGNOSIS — I2729 Other secondary pulmonary hypertension: Secondary | ICD-10-CM | POA: Diagnosis not present

## 2019-08-31 DIAGNOSIS — J849 Interstitial pulmonary disease, unspecified: Secondary | ICD-10-CM

## 2019-08-31 DIAGNOSIS — L97212 Non-pressure chronic ulcer of right calf with fat layer exposed: Secondary | ICD-10-CM | POA: Diagnosis not present

## 2019-08-31 DIAGNOSIS — L97312 Non-pressure chronic ulcer of right ankle with fat layer exposed: Secondary | ICD-10-CM | POA: Diagnosis not present

## 2019-08-31 DIAGNOSIS — J9611 Chronic respiratory failure with hypoxia: Secondary | ICD-10-CM

## 2019-08-31 DIAGNOSIS — L97812 Non-pressure chronic ulcer of other part of right lower leg with fat layer exposed: Secondary | ICD-10-CM | POA: Diagnosis not present

## 2019-08-31 DIAGNOSIS — I10 Essential (primary) hypertension: Secondary | ICD-10-CM | POA: Diagnosis not present

## 2019-08-31 DIAGNOSIS — I87331 Chronic venous hypertension (idiopathic) with ulcer and inflammation of right lower extremity: Secondary | ICD-10-CM | POA: Diagnosis not present

## 2019-08-31 DIAGNOSIS — I87311 Chronic venous hypertension (idiopathic) with ulcer of right lower extremity: Secondary | ICD-10-CM | POA: Diagnosis not present

## 2019-08-31 NOTE — Assessment & Plan Note (Addendum)
Following clinically, chest x-ray.  Continue prednisone 5 mg daily

## 2019-08-31 NOTE — Assessment & Plan Note (Signed)
Tolerating and benefiting from macitentan, sildenafil.  Her most recent echocardiogram was January 2020, PASP estimated 58 mmHg (improved).  Plan to continue same, repeat her echocardiogram January 2021

## 2019-08-31 NOTE — Assessment & Plan Note (Signed)
She is on oxygen at 2 to 3 L/min.  Good compliance.  Continue same

## 2019-08-31 NOTE — Progress Notes (Signed)
Virtual Visit via Video Note  I connected with [name] [name] on 08/31/19 at  3:30 PM EDT by a video enabled telemedicine application and verified that I am speaking with the correct person using two identifiers.  Location: Patient: Home Provider: Office   I discussed the limitations of evaluation and management by telemedicine and the availability of in person appointments. The patient expressed understanding and agreed to proceed.  History of Present Illness: 68 year old woman with scleroderma and associated interstitial lung disease, pulmonary hypertension, chronic hypoxemic respiratory failure.  She is maintained on prednisone 5 mg daily, has been on a stable dose for several years.  She is on sildenafil and macitentan for PAH.  Her oxygen is at 2 to 3 L/min at all times   Observations/Objective: She has been social distancing, protecting herself. She is using o2 reliably.  She is on sildenifil 60 mg tid, macetentan 12m qd. She is still getting botox injections. We have deferred a 6 minute walk test recently as she has difficulty due to her peripheral neuropathy. No LE edema. Still goes to the wound center for her LE wounds.  No new issues, no new medications  Assessment and Plan: Chronic respiratory failure ([place] She is on oxygen at 2 to 3 L/min.  Good compliance.  Continue same  Pulmonary hypertension associated with systemic disorder ([place] Tolerating and benefiting from macitentan, sildenafil.  Her most recent echocardiogram was January 2020, PASP estimated 58 mmHg (improved).  Plan to continue same, repeat her echocardiogram January 2021  ILD (interstitial lung disease) ([facility] Following clinically, chest x-ray.  Continue prednisone 5 mg daily    Follow Up Instructions: January 2021   I discussed the assessment and treatment plan with the patient. The patient was provided an opportunity to ask questions and all were answered. The patient agreed with the plan and demonstrated an  understanding of the instructions.   The patient was advised to call back or seek an in-person evaluation if the symptoms worsen or if the condition fails to improve as anticipated.  I provided 15 minutes of non-face-to-face time during this encounter.   RCollene Gobble MD

## 2019-09-02 ENCOUNTER — Telehealth: Payer: Self-pay | Admitting: Internal Medicine

## 2019-09-02 ENCOUNTER — Other Ambulatory Visit: Payer: Self-pay

## 2019-09-02 ENCOUNTER — Encounter: Payer: Self-pay | Admitting: Internal Medicine

## 2019-09-02 ENCOUNTER — Ambulatory Visit (INDEPENDENT_AMBULATORY_CARE_PROVIDER_SITE_OTHER): Payer: Medicare Other | Admitting: Internal Medicine

## 2019-09-02 VITALS — BP 130/80 | HR 86 | Temp 98.3°F | Ht 67.5 in | Wt 139.0 lb

## 2019-09-02 DIAGNOSIS — I2729 Other secondary pulmonary hypertension: Secondary | ICD-10-CM | POA: Diagnosis not present

## 2019-09-02 DIAGNOSIS — R55 Syncope and collapse: Secondary | ICD-10-CM | POA: Diagnosis not present

## 2019-09-02 DIAGNOSIS — M349 Systemic sclerosis, unspecified: Secondary | ICD-10-CM

## 2019-09-02 DIAGNOSIS — L570 Actinic keratosis: Secondary | ICD-10-CM | POA: Diagnosis not present

## 2019-09-02 DIAGNOSIS — C44319 Basal cell carcinoma of skin of other parts of face: Secondary | ICD-10-CM | POA: Diagnosis not present

## 2019-09-02 DIAGNOSIS — L820 Inflamed seborrheic keratosis: Secondary | ICD-10-CM | POA: Diagnosis not present

## 2019-09-02 DIAGNOSIS — X32XXXD Exposure to sunlight, subsequent encounter: Secondary | ICD-10-CM | POA: Diagnosis not present

## 2019-09-02 DIAGNOSIS — Z23 Encounter for immunization: Secondary | ICD-10-CM | POA: Diagnosis not present

## 2019-09-02 DIAGNOSIS — C441191 Basal cell carcinoma of skin of left upper eyelid, including canthus: Secondary | ICD-10-CM | POA: Diagnosis not present

## 2019-09-02 MED ORDER — VALACYCLOVIR HCL 500 MG PO TABS: 500.0000 mg | ORAL_TABLET | Freq: Two times a day (BID) | ORAL | 0 refills | Status: DC

## 2019-09-02 NOTE — Telephone Encounter (Signed)
Ellery just called back and said that she forgot to mention that she has a fever blister on her lip and would like to have some Valtrex called in.

## 2019-09-03 ENCOUNTER — Ambulatory Visit: Payer: Medicare Other | Admitting: Internal Medicine

## 2019-09-03 LAB — COMPLETE METABOLIC PANEL WITH GFR
AG Ratio: 1.7 (calc) (ref 1.0–2.5)
ALT: 18 U/L (ref 6–29)
AST: 26 U/L (ref 10–35)
Albumin: 4.2 g/dL (ref 3.6–5.1)
Alkaline phosphatase (APISO): 70 U/L (ref 37–153)
BUN/Creatinine Ratio: 28 (calc) — ABNORMAL HIGH (ref 6–22)
BUN: 41 mg/dL — ABNORMAL HIGH (ref 7–25)
CO2: 26 mmol/L (ref 20–32)
Calcium: 8.9 mg/dL (ref 8.6–10.4)
Chloride: 96 mmol/L — ABNORMAL LOW (ref 98–110)
Creat: 1.47 mg/dL — ABNORMAL HIGH (ref 0.50–0.99)
GFR, Est African American: 42 mL/min/{1.73_m2} — ABNORMAL LOW (ref >=60)
GFR, Est Non African American: 36 mL/min/{1.73_m2} — ABNORMAL LOW (ref >=60)
Globulin: 2.5 g/dL (calc) (ref 1.9–3.7)
Glucose, Bld: 102 mg/dL — ABNORMAL HIGH (ref 65–99)
Potassium: 4.2 mmol/L (ref 3.5–5.3)
Sodium: 136 mmol/L (ref 135–146)
Total Bilirubin: 0.4 mg/dL (ref 0.2–1.2)
Total Protein: 6.7 g/dL (ref 6.1–8.1)

## 2019-09-03 LAB — CBC WITH DIFFERENTIAL/PLATELET
Absolute Monocytes: 757 cells/uL (ref 200–950)
Basophils Absolute: 26 cells/uL (ref 0–200)
Basophils Relative: 0.3 %
Eosinophils Absolute: 0 cells/uL — ABNORMAL LOW (ref 15–500)
Eosinophils Relative: 0 %
HCT: 32.1 % — ABNORMAL LOW (ref 35.0–45.0)
Hemoglobin: 11 g/dL — ABNORMAL LOW (ref 11.7–15.5)
Lymphs Abs: 619 cells/uL — ABNORMAL LOW (ref 850–3900)
MCH: 34 pg — ABNORMAL HIGH (ref 27.0–33.0)
MCHC: 34.3 g/dL (ref 32.0–36.0)
MCV: 99.1 fL (ref 80.0–100.0)
MPV: 10.1 fL (ref 7.5–12.5)
Monocytes Relative: 8.8 %
Neutro Abs: 7198 cells/uL (ref 1500–7800)
Neutrophils Relative %: 83.7 %
Platelets: 288 10*3/uL (ref 140–400)
RBC: 3.24 10*6/uL — ABNORMAL LOW (ref 3.80–5.10)
RDW: 11.7 % (ref 11.0–15.0)
Total Lymphocyte: 7.2 %
WBC: 8.6 10*3/uL (ref 3.8–10.8)

## 2019-09-03 NOTE — Telephone Encounter (Signed)
This was done yesterday.

## 2019-09-03 NOTE — Telephone Encounter (Signed)
Valtrex 500 mg bid x 5 days #10 with one refill

## 2019-09-09 ENCOUNTER — Other Ambulatory Visit: Payer: Self-pay | Admitting: Internal Medicine

## 2019-09-11 NOTE — Progress Notes (Addendum)
   Subjective:    [name], female    DOB: 06-May-1951, 68 y.o.   MRN: [id number]  HPI 68 year old Female came in this afternoon for flu vaccine but complained of dizziness earlier in the day.  Was at dermatologist.  Had a sitting car and when she got out of the car felt a bit lightheaded but had no frank syncope.  It alarmed patient and her husband.  She wanted to be seen regarding this.  She has had very little to eat or drink prior to going to the dermatologist late morning.  She has a history of progressive systemic sclerosis and pulmonary hypertension.  Review of Systems see above     Objective:   Physical Exam See orthostatic BPs with 20 mm drop from lying to standing.  Pulse oximetry 92%.  She wears home oxygen. Neck is supple.  She is alert and oriented x3.  Chest clear to auscultation.  Cardiac exam regular rate and rhythm normal S1 and S2.  No lower extremity edema.      Assessment & Plan:  Near syncope- likely orthostasis from volume depletion  Severe systemic sclerosis  Pulmonary hypertension-oxygen dependent  Flu vaccine given  Plan: Keep well hydrated.  Check CBC and C-met today.  Call if symptoms persist

## 2019-09-20 NOTE — Patient Instructions (Signed)
Flu vaccine given.  CBC and C met check today due to near syncope.  Stay well-hydrated.  Call if symptoms persist.

## 2019-09-20 NOTE — Addendum Note (Signed)
Addended by: [name] on: 09/20/2019 02:42 PM   Modules accepted: Level of Service

## 2019-09-21 ENCOUNTER — Encounter (HOSPITAL_BASED_OUTPATIENT_CLINIC_OR_DEPARTMENT_OTHER): Payer: Medicare Other | Attending: Internal Medicine

## 2019-09-21 DIAGNOSIS — M069 Rheumatoid arthritis, unspecified: Secondary | ICD-10-CM | POA: Insufficient documentation

## 2019-09-21 DIAGNOSIS — I83218 Varicose veins of right lower extremity with both ulcer of other part of lower extremity and inflammation: Secondary | ICD-10-CM | POA: Diagnosis not present

## 2019-09-21 DIAGNOSIS — I7300 Raynaud's syndrome without gangrene: Secondary | ICD-10-CM | POA: Insufficient documentation

## 2019-09-21 DIAGNOSIS — L97312 Non-pressure chronic ulcer of right ankle with fat layer exposed: Secondary | ICD-10-CM | POA: Diagnosis not present

## 2019-09-21 DIAGNOSIS — L97815 Non-pressure chronic ulcer of other part of right lower leg with muscle involvement without evidence of necrosis: Secondary | ICD-10-CM | POA: Insufficient documentation

## 2019-09-21 DIAGNOSIS — L97812 Non-pressure chronic ulcer of other part of right lower leg with fat layer exposed: Secondary | ICD-10-CM | POA: Diagnosis not present

## 2019-09-21 DIAGNOSIS — M349 Systemic sclerosis, unspecified: Secondary | ICD-10-CM | POA: Diagnosis not present

## 2019-09-21 DIAGNOSIS — I10 Essential (primary) hypertension: Secondary | ICD-10-CM | POA: Diagnosis not present

## 2019-09-21 DIAGNOSIS — I87311 Chronic venous hypertension (idiopathic) with ulcer of right lower extremity: Secondary | ICD-10-CM | POA: Diagnosis not present

## 2019-09-21 DIAGNOSIS — I739 Peripheral vascular disease, unspecified: Secondary | ICD-10-CM | POA: Diagnosis not present

## 2019-09-21 DIAGNOSIS — L97215 Non-pressure chronic ulcer of right calf with muscle involvement without evidence of necrosis: Secondary | ICD-10-CM | POA: Diagnosis not present

## 2019-09-30 DIAGNOSIS — Z85828 Personal history of other malignant neoplasm of skin: Secondary | ICD-10-CM | POA: Diagnosis not present

## 2019-09-30 DIAGNOSIS — X32XXXD Exposure to sunlight, subsequent encounter: Secondary | ICD-10-CM | POA: Diagnosis not present

## 2019-09-30 DIAGNOSIS — L570 Actinic keratosis: Secondary | ICD-10-CM | POA: Diagnosis not present

## 2019-09-30 DIAGNOSIS — Z08 Encounter for follow-up examination after completed treatment for malignant neoplasm: Secondary | ICD-10-CM | POA: Diagnosis not present

## 2019-09-30 DIAGNOSIS — C4442 Squamous cell carcinoma of skin of scalp and neck: Secondary | ICD-10-CM | POA: Diagnosis not present

## 2019-10-01 ENCOUNTER — Telehealth (HOSPITAL_COMMUNITY): Payer: Self-pay

## 2019-10-01 NOTE — Telephone Encounter (Signed)
Called pt she stated shes up all night using the restroom and then when she wakes up she has to drink a lot of fluids just so she doesn't feel bad. Pt thinks she is on too many diuretics and wants to either get an appointment or make medication changes. Pt denies any sob, edema, dizziness, chest pain, or fatigue. Pts weight is stable.  Routed to [place] for advice

## 2019-10-01 NOTE — Telephone Encounter (Signed)
I can have televisit with her on Monday.

## 2019-10-01 NOTE — Telephone Encounter (Signed)
PT contacted [facility] office in need of an appt as she is having issues with dehydration.  Transferred call to triage for appropriate handling/scheduling.

## 2019-10-02 ENCOUNTER — Encounter (HOSPITAL_COMMUNITY): Payer: Self-pay | Admitting: *Deleted

## 2019-10-02 NOTE — Telephone Encounter (Signed)
Called pt no answer. mychart message sent for virtual visit Monday at 11am per Kevan Rosebush, RN

## 2019-10-05 ENCOUNTER — Ambulatory Visit (HOSPITAL_COMMUNITY)
Admission: RE | Admit: 2019-10-05 | Discharge: 2019-10-05 | Disposition: A | Discharge status: Home / Self Care | Payer: Medicare Other | Source: Ambulatory Visit | Attending: Internal Medicine | Admitting: Internal Medicine

## 2019-10-05 ENCOUNTER — Other Ambulatory Visit (HOSPITAL_COMMUNITY): Payer: Self-pay | Admitting: *Deleted

## 2019-10-05 ENCOUNTER — Other Ambulatory Visit: Payer: Self-pay

## 2019-10-05 DIAGNOSIS — I272 Pulmonary hypertension, unspecified: Secondary | ICD-10-CM

## 2019-10-05 DIAGNOSIS — M349 Systemic sclerosis, unspecified: Secondary | ICD-10-CM

## 2019-10-05 DIAGNOSIS — I96 Gangrene, not elsewhere classified: Secondary | ICD-10-CM | POA: Diagnosis not present

## 2019-10-05 MED ORDER — TORSEMIDE 20 MG PO TABS: ORAL_TABLET | ORAL | 3 refills | Status: DC

## 2019-10-05 NOTE — Patient Instructions (Addendum)
-  Decrease torsemide to 40/20  (Take afternoon dose prior to 5pm)    -You can take extra torsemide as needed for swelling   -BMET 1 week with Dr. Renold Genta   -Discuss with Dr. Amil Amen about decreasing Adalat to 30   -Televisit with Dr.Bensimhon in 4 weeks (11/02/2019 at 10:20am)

## 2019-10-05 NOTE — Progress Notes (Addendum)
Called patient and discussed med changes and orders. AVS mailed to patients home.   1. Decrease torsemide to 40/20 (was on 40 bid)  2. Take afternoon dose prior to 5pm  3. Can take extra torsemide as needed for swelling  4. BMET 1 week with Dr. Renold Genta  5. Discuss with Dr. Amil Amen about decreasing Adalat to 30  6. Televisit with me 4 week.

## 2019-10-05 NOTE — Progress Notes (Signed)
Heart Failure TeleHealth Note  Due to national recommendations of social distancing due to [place] 19, Audio/video telehealth visit is felt to be most appropriate for this patient at this time.  See MyChart message from today for patient consent regarding telehealth for [facility].  Date:  10/05/2019   ID:  [name] [name], DOB 26-Feb-1951, MRN [id number]  Location: Home  Provider location: [facility] Type of Visit: Established patient  PCP:  [name], MD  Cardiologist:  Glori Bickers, MD Primary HF: [place]  Chief Complaint: Heart Failure follow-up   History of Present Illness:  [name] [name] is a 68 y.o. 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  PAH Meds: 1. Started on IV epoprostenol (Veletri) in 01/2013. She had intolerable side effects with Veletri.   2. Stopped Tyvaso in 2015. 3. February 2016 switched bosentan to Macitentan 4. Macitentan 10 and sildenafil 60 tid (did not tolerate Adcirca).  5. Selexipag added 5/18 - stopped due to SEs  She presents via audio/video conferencing for a telehealth visit today. Taking torsemide 40 bid. In September had presyncopal episode and felt to be volume depleted. Creatinine 1.31 -> 1.47. No further dizziness. No edema. LE wounds are healing very slowly. Previous had apple grafts. Going to bathroom several times per night. Taking torsemide at 10p. SBP recently 107-115. (Previously in 120s)    Echo 01/16/2019 LVEF 60-65%, PA peak pressure 58 mm Hg  Echo 12/18 EF 60-65% RV dilated with mildly to moderately decreased function   Studies   6 MW 04/18/12 880 feet 6 MW 11/10/12 650 feet. Limited by legs. Sats down to 85% on 5L (sats at rest 95%)  6 MW 3/15 915 feet (279 m) 6 MW 2/16 930 feet (283 m)   RHC 04/19/17  RA = 4 RV = 80/8 PA = 79/17  (43) PCW = 6 Fick cardiac output/index = 4.1/2.4 PVR = 9.0 WU Ao sat = 94% PA sat = 59%, 62%    [name] [name] denies symptoms worrisome for COVID 19.   Past Medical History:  Diagnosis Date  . Anemia   . Arthritis   . DOE (dyspnea on exertion)   . Epistaxis   . History of chicken pox   . Hypertension   . Hypothyroidism   . Leg ulcer ([place])   . Melanoma ([place]) 1999  . 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[place])   . Non Hodgkin's lymphoma ([place])    Treated with chemo 2010, felt to be cured.  Marland Kitchen PAH (pulmonary artery hypertension) ([place])   . Peripheral arterial occlusive disease ([place])   . Pulmonary hypertension ([place])   . Requires supplemental oxygen    2 liters-3liters when moving  . Sclerodermia generalized [facility])    Past Surgical History:  Procedure Laterality Date  . AMPUTATION Right 10/24/2017   Procedure: RAY resection right index;  Surgeon: Daryll Brod, MD;  Location: [place];  Service: Orthopedics;  Laterality: Right;  Axillary block  . AMPUTATION Right 11/21/2018   Procedure: AMPUTATION RAY RIGHT MIDDLE FINGER;  Surgeon: Daryll Brod, MD;  Location: [place];  Service: Orthopedics;  Laterality: Right;  . apligraph  12-04-2102,   12-18-2012   bilateral calves   [name]  . biopsy on cerebellum    . COLONOSCOPY WITH PROPOFOL Left 10/07/2015   Procedure: COLONOSCOPY  WITH PROPOFOL;  Surgeon: [name], MD;  Location: [place];  Service: Endoscopy;  Laterality: Left;  . ENDARTERECTOMY FEMORAL Right 09/14/2016   Procedure: ENDARTERECTOMY FEMORAL RIGHT;  Surgeon: [name], MD;  Location: [facility] OR;  Service: Vascular;  Laterality: Right;  . ENDOVENOUS ABLATION SAPHENOUS VEIN W/ LASER  07-24-2012   right greater saphenous vein by [name], M.D.   . ENDOVENOUS ABLATION SAPHENOUS VEIN W/ LASER  10-16-2012   left greater saphenous vein  by Dr. [name]  . ENDOVENOUS ABLATION SAPHENOUS VEIN W/ LASER Right 09-01-2015   endovenous laser ablation right  greater saphenous vein by [name] MD  . ESOPHAGOGASTRODUODENOSCOPY N/A 05/07/2016   Procedure: ESOPHAGOGASTRODUODENOSCOPY (EGD);  Surgeon: [name], MD;  Location: [facility] ENDOSCOPY;  Service: Endoscopy;  Laterality: N/A;  . ESOPHAGOGASTRODUODENOSCOPY (EGD) WITH PROPOFOL Left 10/07/2015   Procedure: ESOPHAGOGASTRODUODENOSCOPY (EGD) WITH PROPOFOL;  Surgeon: [name], MD;  Location: [place];  Service: Endoscopy;  Laterality: Left;  . GIVENS CAPSULE STUDY N/A 07/31/2016   Procedure: GIVENS CAPSULE STUDY;  Surgeon: [name], MD;  Location: [facility] ENDOSCOPY;  Service: Endoscopy;  Laterality: N/A;  . melonoma removes Right    behind right knee  . PATCH ANGIOPLASTY Right 09/14/2016   Procedure: PATCH ANGIOPLASTY RIGHT FEMORAL ARTERY USING HEMASHIELD PLATINUM FINESSE PATCH;  Surgeon: [name], MD;  Location: [place];  Service: Vascular;  Laterality: Right;  . PERIPHERAL VASCULAR CATHETERIZATION N/A 09/07/2016   Procedure: Abdominal Aortogram w/Lower Extremity;  Surgeon: [name], MD;  Location: [place] CV LAB;  Service: Cardiovascular;  Laterality: N/A;  . RIGHT HEART CATH N/A 04/19/2017   Procedure: Right Heart Cath;  Surgeon: [name], MD;  Location: [place] CV LAB;  Service: Cardiovascular;  Laterality: N/A;  . RIGHT HEART CATHETERIZATION N/A 11/14/2012   Procedure: RIGHT HEART CATH;  Surgeon: [name], MD;  Location: [facility] CATH LAB;  Service: Cardiovascular;  Laterality: N/A;  . rt little toe removal  10/1998  . stent left thigh  3/11  . TENDON TRANSFER Right 10/24/2017   Procedure: transfer extensor indecus propius/extensor digitorum commomus index to middle ring and small;  Surgeon: Daryll Brod, MD;  Location: [place];  Service: Orthopedics;  Laterality: Right;  . TONSILLECTOMY    . TONSILLECTOMY AND ADENOIDECTOMY    . TUNNELED VENOUS CATHETER PLACEMENT  01/2013  . ULNAR HEAD EXCISION Right 10/24/2017   Procedure: darrach right ulna;  Surgeon: Daryll Brod, MD;   Location: [place];  Service: Orthopedics;  Laterality: Right;  . VEIN LIGATION AND STRIPPING  05/16/2012   Procedure: VEIN LIGATION AND STRIPPING;  Surgeon: [name], MD;  Location[facility] OR;  Service: Vascular;  Laterality: Right;  Irrigation and Debridement right lower leg, ligation of saphenous vein.     Current Outpatient Medications  Medication Sig Dispense Refill  . acetaminophen (TYLENOL ARTHRITIS PAIN) 650 MG CR tablet Take 1,300 mg by mouth every 8 (eight) hours.     Marland Kitchen atorvastatin (LIPITOR) 20 MG tablet Take 1 tablet (20 mg total) by mouth daily. 90 tablet 3  . Biotin 5000 MCG TABS Take 5,000 mcg by mouth daily.    Marland Kitchen CALCIUM-MAGNESIUM-VITAMIN D PO Take 2 tablets by mouth daily.    . Cholecalciferol (VITAMIN D) 2000 units CAPS Take 2,000 Units by mouth daily.    . citalopram (CELEXA) 20 MG tablet TAKE 1 TABLET BY MOUTH EVERYDAY AT BEDTIME 90 tablet 3  . clopidogrel (PLAVIX) 75 MG tablet TAKE 1 TABLET BY MOUTH  EVERY DAY 90 tablet 3  . FERREX 150 150 MG capsule TAKE ONE CAPSULE BY MOUTH TWICE A DAY 180 capsule 3  . fish oil-omega-3 fatty acids 1000 MG capsule Take 1,000 mg by mouth 2 (two) times daily.     Marland Kitchen gabapentin (NEURONTIN) 300 MG capsule Take 600 mg by mouth 2 (two) times daily.    Marland Kitchen levothyroxine (SYNTHROID) 50 MCG tablet TAKE 1 TABLET BY MOUTH DAILY BEFORE BREAKFAST 90 tablet 1  . Multiple Vitamin (MULTIVITAMIN WITH MINERALS) TABS tablet Take 1 tablet by mouth daily.    Marland Kitchen NIFEdipine (ADALAT CC) 60 MG 24 hr tablet Take 60 mg by mouth daily.    . OPSUMIT 10 MG tablet Take 1 tablet (68m) by mouth daily. 30 tablet 11  . OXYGEN Inhale 2-3 L into the lungs continuous.     . pantoprazole (PROTONIX) 40 MG tablet Take 1 tablet (40 mg total) by mouth 2 (two) times daily. 60 tablet 1  . Probiotic CAPS Take 1 capsule by mouth daily.    . sildenafil (REVATIO) 20 MG tablet Take 3 tablets (60 mg total) by mouth 3 (three) times daily. 810 tablet 1  . spironolactone (ALDACTONE) 25 MG tablet  TAKE 1 TABLET BY MOUTH EVERY DAY 90 tablet 1  . torsemide (DEMADEX) 20 MG tablet TAKE 2 TABLETS (40 MG TOTAL) BY MOUTH 2 (TWO) TIMES DAILY. 360 tablet 3  . valACYclovir (VALTREX) 500 MG tablet Take 1 tablet (500 mg total) by mouth 2 (two) times daily. 10 tablet 0  . vitamin B-12 (CYANOCOBALAMIN) 1000 MCG tablet Take 1,000 mcg by mouth daily.     No current facility-administered medications for this encounter.     Allergies:   Levofloxacin, Sulfa antibiotics, Levofloxacin in d5w, Doxycycline hyclate, and Penicillins   Social History:  The patient  reports that she has never smoked. She has never used smokeless tobacco. She reports current alcohol use. She reports that she does not use drugs.   Family History:  The patient's family history includes Cancer in her daughter; Colon cancer in an other family member; Hyperlipidemia in her mother; Lupus in her father; Lymphoma in her father.   ROS:  Please see the history of present illness.   All other systems are personally reviewed and negative.   Exam:  (Video/Tele Health Call; Exam is subjective and or/visual.) General:  Speaks in full sentences. No resp difficulty. Lungs: Normal respiratory effort with conversation.  Abdomen: Non-distended per patient report Extremities: Pt denies edema. Neuro: Alert & oriented x 3.   Recent Labs: 12/02/2018: TSH 4.33 09/02/2019: ALT 18; BUN 41; Creat 1.47; Hemoglobin 11.0; Platelets 288; Potassium 4.2; Sodium 136  Personally reviewed   Wt Readings from Last 3 Encounters:  09/02/19 63 kg (139 lb)  07/24/19 63 kg (139 lb)  07/17/19 64 kg (141 lb)      ASSESSMENT AND PLAN:  1. 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  Unable to tolerate IV epoprostenol, Tyvaso or selexipag.  She remains on Revatio and macitentan. - NYHA III  WHO class II-III dyspnea on macitentan 10 and revatio 60 tid -  RHC 4/18 as above -has now failed Selexipag as well as uptitration of her other PAH meds. Only other option would be to  try to switch sildenafil to Adempas but will defer for now given intolerances. - Echo 12/18. Mild RV strain. Unable to estimate RVSP due to lack of significant TR  - Echo 01/16/2019 LVEF 60-65%, PA peak pressure 58 mm Hg. RV  looks normal (much improved) - Volume status seems ok to slightly low. Cut torsemide to 40/20 and take prior to 5pm. Can take extra torsemide as needed. Need to keep edema down to heal the wounds.  - BMET next week.  - F/u call 4 weeks - Continue home oxygen.  2. Scleroderma:  - Advanced. She is followed by Dr. Amil Amen. - With low BP will drop Adalat back to 30 daily  3. Chronic L Ulceration: Followed by Wound Care.   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 Stable.  4. PAD with LE wounds: - Follows with [facility]. Continue Plavix - Seeing Dr. Dellia Nims at [place]  5. Interstitial lung disease:  - Secondary to scleroderma.  - Stable on chronic O2.  - Followed by Dr. Lamonte Sakai  Recent chest CT reviewed and is stable  Signed, Glori Bickers, MD  10/05/2019 12:36 PM  Advanced [facility] Failure [place] [id number] [phone] (office) [phone] (fax)

## 2019-10-05 NOTE — Addendum Note (Signed)
Encounter addended by: Harvie Junior, CMA on: 10/05/2019 1:36 PM  Actions taken: Clinical Note Signed, Order list changed

## 2019-10-06 DIAGNOSIS — Z1231 Encounter for screening mammogram for malignant neoplasm of breast: Secondary | ICD-10-CM | POA: Diagnosis not present

## 2019-10-06 LAB — HM MAMMOGRAPHY

## 2019-10-08 ENCOUNTER — Encounter: Payer: Self-pay | Admitting: Internal Medicine

## 2019-10-12 ENCOUNTER — Other Ambulatory Visit: Payer: Self-pay

## 2019-10-12 ENCOUNTER — Encounter (HOSPITAL_BASED_OUTPATIENT_CLINIC_OR_DEPARTMENT_OTHER): Payer: Medicare Other | Attending: Internal Medicine | Admitting: Internal Medicine

## 2019-10-12 DIAGNOSIS — L97312 Non-pressure chronic ulcer of right ankle with fat layer exposed: Secondary | ICD-10-CM | POA: Insufficient documentation

## 2019-10-12 DIAGNOSIS — I10 Essential (primary) hypertension: Secondary | ICD-10-CM | POA: Insufficient documentation

## 2019-10-12 DIAGNOSIS — I87331 Chronic venous hypertension (idiopathic) with ulcer and inflammation of right lower extremity: Secondary | ICD-10-CM | POA: Diagnosis not present

## 2019-10-12 DIAGNOSIS — L97815 Non-pressure chronic ulcer of other part of right lower leg with muscle involvement without evidence of necrosis: Secondary | ICD-10-CM | POA: Insufficient documentation

## 2019-10-12 DIAGNOSIS — L97212 Non-pressure chronic ulcer of right calf with fat layer exposed: Secondary | ICD-10-CM | POA: Diagnosis not present

## 2019-10-12 DIAGNOSIS — I87311 Chronic venous hypertension (idiopathic) with ulcer of right lower extremity: Secondary | ICD-10-CM | POA: Diagnosis not present

## 2019-10-13 NOTE — Progress Notes (Addendum)
ROSELIND, KLUS ([id number]) Visit Report for 10/12/2019 Debridement Details Patient Name: Date of Service: SHAVAUN, OSTERLOH 10/12/2019 8:30 AM Medical Record Number:[id number] Patient Account Number: [id number] Date of Birth/Sex: Treating RN: [birth year] (68 y.o. [name] Primary Care Provider: [name] Other Clinician: Referring Provider: Treating Provider/Extender:Robson, Leotis Shames, MARY Weeks in Treatment: 394 Debridement Performed for Wound #5 Right,Medial Lower Leg Assessment: Performed By: Physician Ricard Dillon., MD Debridement Type: Debridement Severity of Tissue Pre Fat layer exposed Debridement: Level of Consciousness (Pre- Awake and Alert procedure): Pre-procedure Verification/Time Out Taken: Yes - 09:22 Start Time: 09:22 Pain Control: Lidocaine 5% topical ointment Total Area Debrided (L x W): 1.3 (cm) x 1.2 (cm) = 1.56 (cm) Tissue and other material Viable, Non-Viable, Slough, Subcutaneous, Slough debrided: Level: Skin/Subcutaneous Tissue Debridement Description: Excisional Instrument: Curette Bleeding: Minimum End Time: 09:23 Procedural Pain: 0 Post Procedural Pain: 0 Response to Treatment: Procedure was tolerated well Level of Consciousness Awake and Alert (Post-procedure): Post Debridement Measurements of Total Wound Length: (cm) 1.3 Width: (cm) 1.2 Depth: (cm) 0.1 Volume: (cm) 0.123 Character of Wound/Ulcer Post Improved Debridement: Severity of Tissue Post Debridement: Fat layer exposed Post Procedure Diagnosis Same as Pre-procedure Electronic Signature(s) Signed: 10/12/2019 5:50:55 PM By: Linton Ham MD Signed: 10/13/2019 5:52:09 PM By: Levan Hurst RN, BSN Entered By: Linton Ham on 10/12/2019 09:41:12 -------------------------------------------------------------------------------- Debridement Details Patient Name: Date of Service: [name] [name] 10/12/2019 8:30 AM Medical Record Number:[id number] Patient  Account Number: [id number] Date of Birth/Sex: Treating RN: [birth year] (68 y.o. [name] Primary Care Provider: [name] Other Clinician: Referring Provider: Treating Provider/Extender:Robson, Leotis Shames, MARY Weeks in Treatment: 394 Debridement Performed for Wound #7 Right,Lateral Malleolus Assessment: Performed By: Physician Ricard Dillon., MD Debridement Type: Debridement Severity of Tissue Pre Fat layer exposed Debridement: Level of Consciousness (Pre- Awake and Alert procedure): Pre-procedure Yes - 09:21 Verification/Time Out Taken: Start Time: 09:21 Pain Control: Lidocaine 5% topical ointment Total Area Debrided (L x W): 0.7 (cm) x 0.5 (cm) = 0.35 (cm) Tissue and other material Viable, Non-Viable, Slough, Subcutaneous, Slough debrided: Level: Skin/Subcutaneous Tissue Debridement Description: Excisional Instrument: Curette Bleeding: Minimum End Time: 09:22 Procedural Pain: 0 Post Procedural Pain: 0 Response to Treatment: Procedure was tolerated well Level of Consciousness Awake and Alert (Post-procedure): Post Debridement Measurements of Total Wound Length: (cm) 0.7 Width: (cm) 0.5 Depth: (cm) 0.1 Volume: (cm) 0.027 Character of Wound/Ulcer Post Improved Debridement: Severity of Tissue Post Debridement: Fat layer exposed Post Procedure Diagnosis Same as Pre-procedure Electronic Signature(s) Signed: 10/12/2019 5:50:55 PM By: Linton Ham MD Signed: 10/13/2019 5:52:09 PM By: Levan Hurst RN, BSN Entered By: Linton Ham on 10/12/2019 09:41:25 -------------------------------------------------------------------------------- HPI Details Patient Name: Date of Service: [name] [name] 10/12/2019 8:30 AM Medical Record Number:[id number] Patient Account Number: [id number] Date of Birth/Sex: Treating RN: 1951-03-14 (68 y.o. [name] Primary Care Provider: [name] Other Clinician: Referring Provider: Treating  Provider/Extender:Robson, Leotis Shames, MARY Weeks in Treatment: 394 History of Present Illness HPI Description: this is a patient who initially came to [place] for wounds on the medial malleoli bilaterally as well as her upper medial lower extremities bilaterally. These wounds eventually healed with assistance of Apligraf's bilaterally. While this was occurring she developed the current wound which opened into a fairly substantial wound on the right lower extremity. These are mostly secondary to venous stasis physiology however the patient also has underlying scleroderma, pulmonary hypertension. The wound has been making good progress lately with the Hydrofera Blue-based dressing. 03/17/2015; patient had a arterial  evaluation a year ago. Her right ABI was 0.86 left was 1.0. Toe brachial index was 0.41 on the right 0.45 on the left. Her bilateral common femoral artery waveforms were triphasic. Her white popliteal posterior tibial artery and anterior tibial artery waveforms were monophasic with good amplitude. Luteal artery waveforms were biphasic it was felt that her bilateral great toe pressures are of normal although adequate for tissue healing. 03/24/2015. The condition of this wound is not really improved that. He was covered with as fibrinous surface slough and eschar. This underwent an aggressive debridement with both a curette and scalpel. I still cannot really get down to what I can would consider to be a viable surface. There is no evidence of infection. Previous workup for ischemia roughly a year ago was negative nevertheless I think that continues to be a concern 04/07/15. The patient arrived for application of second Apligraf. Once again the surface of this wound is certainly less viable than I would like for an advanced treatment option. An aggressive debridement was done. She developed some arteriolar bleeding which required that along pressure and silver alginate. 04/21/15: change in the  condition of this wound. Once again it is covered in a gelatinous surface slough. After debridement today surface of the wound looks somewhat better but now a heeling surface 05/05/15 Apligraf #4 applied.Still a lot of slough on this wound. 05/19/15 Apligraf #5 applied. Still a lot of slough on this wound I did not aggressively remove this 06/02/15; continued copious amounts of surface slough. This debridement fairly easily. 06/08/2015 -- the last time she had a venous duplex study done was over 3 years ago and the surgery was prior to that. I have recommended that she sees Dr. early for a another opinion regarding a repeat venous duplex and possibly more endovenous ablation of vein stripping of micro-phlebotomies. 06/16/15; wound has a gelatinous surface eschar that the debridement fairly easily to a point. I don't disagree with the venous workup and perhaps even arterial re-evaluation. She is on prednisone 5 mg and continue his medications for her pulmonary artery hypertension I am not sure if the latter have any wound care healing issues I would need to investigate this. 06/23/15 continues with a gelatinous surface eschar with of fibrinous underlying. What I can see of this wound does not look unhealthy however I just can't get this material which I think is 2 different layers off. Empiric culture done 06/30/15; unfortunately not a lot of change in this wound. A gelatinous surface eschar is easily removed however it has a tight fibrinous surface underneath the. 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 The wound comes back and basically and unchanged state the. She has a gelatinous surface that is more easily removed however there is a tightly fibrinous surface underneath the. There is no evidence of infection. She has a vascular follow-up next month. I would have to inject her in order to do a more aggressive debridement of this area 07/21/15 the wound is roughly in the same state  albeit the debridement is done with greater ease. There is less of the fibrinous eschar underneath the. There is no evidence of infection. She has follow-up with vascular surgery next week. No evidence of surrounding infection. Her original distal wounds healed while this one formed. 07/28/15; wound is easier to debride. No wound erythema. She is seeing Dr. [name] next week. 08/18/15 Has been to [place] for repeat ablation. Have been using medihoney pad  with some improvement 08/25/15; absolutely no change in the condition of this wound in either its overall size or surface condition in many months now. At one point I had this down to [place] healthier surface I think with [facility] however this did not actually progress towards closure. Do not believe that the wound has actually deteriorated in terms of volume at all. We have been using a medihoney pad which allows easier debridement of the gelatinous eschar but again no overall actual improvement. the patient is going towards an ablation with pain and vascular which I think is scheduled for next week. The only other investigation that I could first see would be a biopsy. She does have underlying scleroderma 09/02/15 eschar is much easier to debridement however the base of this does not look particularly vibrant. We changed to Iodoflex. The patient had her ablation earlier this week 09/09/15 again the debridement over the base of this wound is easier and the base of the wound looks considerably better. We will continue the Iodoflex. Dr. Tawni Millers has expressed his satisfaction with the result of her ablation 09/15/15 once again the wound is relatively free of surface eschar. No debridement was done today. It has a pale- looking base to it. although this is not as deep as it once was it seems to be expanding especially inferiorly. She has had recent venous ablation but this is no closer to healing.I've gone ahead and done a punch biopsy this from the  inferior part of the wound close to normal skin 09/22/15: the wound is relatively free of surface eschar. There is some surrounding eschar. I'm not exactly sure at what level the surface is that I am seeing. 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 No evidence of atypical infection, malignancy. 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 No debridement was done 09/29/15; the wound surface is relatively free of surface eschar. There is eschar surrounding the walls of the wound. Aside from the improvement in the amount of surface slough. This wound has not progressed any towards closure. There is not even a surface that looks like there at this is ready. There is no evidence of any infection nor maligancy based on biopsy I did on 9/15. I continued with the Iodoflex however I am looking towards some alternative to try to promote some closure or filling in of this surface. Consider triple layer Oasis. Collagen did not result in adequate control of the surface slough 10/13/15; the patient was in hospital last week with severe anemia. The wound looks somewhat better after debridement although there is widening medially. There is no evidence of infection. 10/20/15; patient's wound on the right lateral lower leg is essentially unchanged. This underwent a light surface debridement and in general the debridement is easier and the surface looks improved. I noted in doing this on the side of the wound what appears to be a piece of calcium deposition. The patient noted that she had previous things on the tips of her fingers. 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 She follows with rheumatology and I have asked them to talk to her about this. In view of that S the nonhealing ulcer may have something to do with calcinosis and also unrelieved Raynaud's disease in this area. I should  note that her original wounds on the right and left medial malleolus and the inner aspects of both legs just below the knees  did however heal over 10/30/15; the patient's wound on the right lateral lower leg is essentially unchanged. I was able to remove some calcified material from the medial wound edge. 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 Otherwise the wound appears essentially unchanged there is less adherent surface eschar. Some of the calcified material was sent to pathology for analysis 11/17/15. The patient's wound on the right lateral leg is essentially unchanged. Wider Medially. 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 There is some surface slough but I did not debridement this. No evidence of surrounding infection. 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 This will make healing difficult 12/15/15; the wound has less depth surface has a fibrinous slough and calcifications in the wound edge no evidence of infection 12/22/15; the wound definitely has less Fibrinous slough on the base. Calcifications around the wound edges are still evident. Although the wound bed looks healthier it is still pale in appearance. Previous biopsy did not show malignancy 01/04/15; surgical debridement of nonviable slough and subcutaneous tissue the wound cleans up quite nicely but appears to be expanding outward calcifications around the wound edges are still there. 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 She is to see her rheumatologist I'll see if he has any opinions on this. My punch biopsy done in September did not show  calcifications although these are clearly evident. 01/19/16 light selective debridement of nonviable surface slough. There is epithelialization medially. This gives me reason for cautious optimism. She has been to see her rheumatologist, there is nothing that can be done for this type of soft tissue calcification associated with scleroderma 02/02/16 no debridement although there is a light surface slough. She has 2 peninsulas of skin 1 inferiorly and one medially. We continue to make a slight and slow but definite progressive here 02/16/16; light surface debridement with more attention to the circumference of the wound bed where the fibrinous eschar is more prevalent. No calcifications detected. She seems to have done nicely with the [facility] with some epithelialization and some improvement in the overall wound volume. 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 Continued attention to the circumference of the wound where the fibrinous eschar in calcinosis or prevalent. No calcifications were detected. She has continued improvement with Hydrofera Blue. The wound is no longer as deep 03/15/16 surgical debridement done to remove surface escha Especially around the circumference of the wound where there is nonviable subcutaneous tissue. In spite of this there is considerable improvement in the overall dimensions and depth of the wound. Islands of epithelialization are seen especially medially inferiorly and superiorly to a lesser extent. She is using Hydrofera Blue at home 03/29/16; surgical debridement done to remove surface eschar and nonviable subcutaneous tissue. This cleans up quite nicely mention slightly larger in terms of length and width however depth is less 04/12/16; continued gradual improvement in terms of depth and the condition of the wound base. Debridement  is done. Continuing long standing Hydrofera Blue at home with Kerlix Coban wraps 04/26/16; continued gradual improvement in terms of depth and management as  well as condition of the wound base. Surgical debridement done she continues with Hydrofera Blue. 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 She initially came to this clinic venous insufficiency ulcers which have long since healed 05/17/16 continued improvement in terms of the depth and measurements of this wound although she has a tightly adherent fibrinous slough each time. We've been continued with long standing Hydrofera Blue which seems to done as well for this wound is many advanced treatment options. 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 She also has chronic venous insufficiency. She has an irritation on her lateral right ankle secondary to our wraps 05/10/16; wound appears to be smaller especially on the medial aspect and especially in the width. Wound was debridement surface looks better. She is also been in the hospital apparently with anemia again she tells me she had an endoscopy. Since she got home after 3 days which I believe was sometime last week she has had an irritated painful area on the right lateral ankle surrounding the lateral malleolus 05/31/16; much more adherent surface slough today then recently although I don't think the dimensions of changed that much. A more aggressive debridement is required. The irritated area over her medial malleolus is more pruritic and painful and I don't think represents cellulitis 06/14/16; no major change in her wound dimensions however there is more tightly adherent surface slough which is disappointing. As well as she appears to have a new small area medially. Furthermore an irritated uncomfortable area on the lateral aspect of her right foot just below the lateral malleolus. 06/21/16; I'm seeing the patient  back in follow-up for the new areas under her major wound on the right anterior leg. She has been using Hydrofera Blue to this area probably for several months now and although the dressings seem to be helping for quite a period of time I think things have stagnated lately. She comes in today with a relatively tight adherent surface slough and really no changes in the wound shape or dimension. The 2 small areas she had inferiorly are tiny but still open they seem improved this well. There is no uncontrolled edema and I don't think there is any evidence of cellulitis. 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 Patient has had vascular evaluations both venous and arterial. I have biopsied this area. There is no obvious infection. The worrisome thing today is that she seems to be developing areas of erythema and epithelial damage on the medial aspect of the right foot. Also to a lesser degree inferior to the actual wound itself. Again I see no obvious changes to suggest cellulitis however as this is the only treatable option I will probably give her antibiotics. 07/13/16 no major change in the lady's large anterior right leg wound. Still covered with a very tightly adherent surface slough which is difficult to debridement. There is less erythema around this, culture last week grew pseudomonas I gave her ciprofloxacin. The area on her lateral right malleolus looks better- 08/02/16 the patient's wounds continued to decline. Her original large anterior right leg wound looks deeper. Still adherent surface slough that is difficult to debridement. She has a small area just below this and a punched-out wound just below her lateral malleolus. In the meantime she is been in hospital with apparently an upper GI bleed on  Plavix and aspirin. She is now just on Plavix she received 3 units of packed  cells 08/23/16; since I last saw this 3 weeks ago, the open large area on her right leg looks about the same syrup. She has a small satellite lesion just underneath this. The area on her medial right ankle is now a deep necrotic wound. I attempted to debridement this however there is just too much pain. 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 She follows with vascular surgery and is scheduled for an angiogram in early September 09/06/16; the patient is going for an arteriogram tomorrow. Her original large wound on the right calf is about the same the satellite lesion underneath it is about the same however the area on her medial ankle is now deeper with exposed tendon. I am no longer attempting to debride these wounds 09/20/16; the patient has undergone a right femoral endarterectomy and Dacron patch angioplasty. This seems to have helped the flow in her right leg. 10/04/16; Arrived today for aggressive debridement of the wounds on her right calf the original wound the one beneath it and a difficult area over her right lateral leg just above the lateral malleolus. 10/25/16; her 3 open wounds are about the same in terms of dimensions however the surface appears a lot healthier post debridement. Using Iodoflex 10/18/16 we have been using Iodoflex to her wounds which she tolerated with some difficulty. 10/11/16; has been using Santyl for a period of time with some improvement although again very adherent surface slough would prevent any attempted healing this. She has a original wound on the left calf, the satellite underneath that and the most recent wound on the right medial ankle. She has completed revascularization by Dr. early and has had venous ablation earlier. Want to go back to Iodoflex to see if week and get a healthier surface to this wound bed failing this I think she'll need to be taken to the OR and I am prepared to  call Dr. Marla Roe to discuss this. She is obviously not a good candidate for general anesthesia however.; 11/08/16; I put her on Iodoflex last time to see if I can get the wound bed any healthier and unfortunately today still had tremendous surface slough. 11/15/16; 4 weeks' worth of Iodoflex with not much improvement. Debridement on the major wounds on her left anterior leg is easier however this does not maintain from week to week. The punched-out area on her medial right ankle 11/29/16; I attempted to change the patient last visit to [facility] however she states this burned and was very uncomfortable therefore we gave her permission to go back to the eye out of complex which she already had at home. Also she noted a lot of pain and swelling on the lateral aspect of her leg before she traveled to [facility] for the holidays, I called her in doxycycline over the phone. This seems to have helped 12/06/16; Wounds unchanged by in large. Using Iodoflex 12/13/16; her wounds today actually looks somewhat better. The area on the right lateral lower leg has reasonably healthy-looking granulation and perhaps as actually filled and a bit. Debridement of the 2 wounds on the medial calf is easier and post debridement appears to have a healthier base. We have referred her to Dr. Migdalia Dk for consideration of operative debridement 12/20/16; we have a quick note from Dr. Merri Ray who feels that the patient needs  to be referred to an academic Manufacturing systems engineer. This is due to the complexity of the patient's medical issues as it applies to anything in the OR. We have been using Iodoflex 12/27/16; in general the wounds on the right leg are better in terms of the difficult to remove surface slough. She has been using Iodoflex. She is approved for Apligraf which I anticipated ordering in the next week or 2 when we get a better-looking surface 01/04/16 the deep wounds on the right leg  generally look better. Both of them are debrided further surface slough. The area on the lateral right leg was not debrided. She is approved for Apligraf I think I'll probably order this either next week or the week after depending on the surface of the wounds superiorly. We have been using Iodoflex which will continue until then 01/11/16; the deep wounds on the right leg again have a surface slough that requires debridement. I've not been able to get the wound bed on either one of these wounds down to what would be acceptable for an advanced skin stab- like Apligraf. The area on the medial leg has been improving. We have been using hot Iodoflex to all wounds which seems to do the best at at least limiting the nonviable surface 01/24/17; we have continued Iodoflex and all her wound areas. Her debridement Gen. he is easier and the surface underneath this looks viable. Nevertheless these are large area wounds with exposed muscle at least on the anterior parts. We have ordered Apligraf's for 2 weeks from now. The patient will be away next week 02/07/17; the patient was close to have first Apligraf today however we did not order one. I therefore replaced her Iodoflex. She essentially has 3 large punched-out areas on her right anterior leg and right medial ankle. 02/11/17; Apligraf #1 02/25/17; Apligraf #2. In general some improvement in the right medial ankle and right anterior leg wounds. The larger wound medially perhaps some better 03/11/17; Apligraf #3. In general continued improvement in the right medial ankle and the right anterior leg wounds. 03/25/17 Apligraf #4. In general continued improvement especially on the right medial ankle and the lower 04/08/17; Apligraf #5 in general continued improvement in all wound sites. 04/22/17; post Apligraf #5 her wound beds continued to look a lot better all of them up to the surface of the surrounding skin. Had a caramel-colored slough that I did not debrided in  case there is residual Apligraf effect. The wounds are as good as I have seen these looking quite some time. 04/29/17; we applied Walters and last week after completing her Apligraf. Wounds look as though they've contracted somewhat although they have a nonviable surface which was problematic in the past. Apparently she has been approved for further Apligraf's. We applied Iodosorb today after debridement. 05/06/17; we're fortunate enough today to be able to apply additional Apligraf approved by her insurance. In general all of her wounds look better Apligraf #6 05/24/17; Apligraf #7 continued improvement in all wounds 3 06/10/17 Apligraf #8. Continued improvement in the surface of all wounds. Not much of an improvement in dimensions as I might a follow 06/24/17 Apligraf #9 continued general improvement although not as much change in the wound areas I might of like. She has a new open area on the right anterior lateral ankle very small and superficial. She also has a necrotic wound on the tip of her right index finger probably secondary to severe uncontrolled Raynaud's phenomenon. She is already on  sildenafil and already seen her rheumatologist who gave her Keflex. 07/08/17; Apligraf #10. Generalized improvement although she has a small additional wound just medial to the major wound area. 07/22/17; after discussion we decided not to reorder any further Apligraf's although there is been considerable improvement with these it hasn't been recently. The major wound anteriorly looks better. Smaller wounds beneath this and the more recent one and laterally look about the same. The area on the right lateral lower leg looks about the same 07/29/17 this is a patient who is exceedingly complex. She has advanced scleroderma, crest syndrome including calcinosis, PAD status post revascularization, chronic venous insufficiency status post ablations. She initially presented to this clinic with wounds on her  bilateral lower legs however these closed. More recently we have been dealing with a large open area superiorly on the right anterior leg, a smaller wound underneath this and then another one on the right medial lower extremity. These improve significantly with 10 Apligraf applications. Over the last 2-3 weeks we are making good progress with Hydrofera Blue and these seem to be making progress towards closure 08/19/17; wounds continued to make good improvement with Hydrofera Blue and episodic aggressive debridements 08/26/17; still using Hydrofera Blue. Good improvements 09/09/17; using Hydrofera Blue continued improvement. Area on the lateral part of her right leg has only a small remaining open area. The small inferior satellite region is for all intents and purposes closed. Her major wound also is come in in terms of depth and has advancing epithelialization. 09/16/17; using Hydrofera Blue with continued improvement. The smaller satellite wound. We've closed out today along with a new were satellite wound medially. The area on her medial ankle is still open and her major wound is still open but making improvement. All using Hydrofera Blue. Currently 09/30/17; using Hydrofera Blue. Still a small open area on the lateral right ankle area and her original major wound seems to be making gradual and steady improvement. 10/14/17; still using Hydrofera Blue. Still too small open areas on the right lateral ankle. Her original major wound is horizontal and linear. The most problematic area paradoxically seems to be the area to the medial area wears I thought it would be the lateral. 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 10/28/17; still using Hydrofera Blue. Right lateral ankle has a very small open area superiorly on the most lateral part of the wound. Her original open wound has 2 open areas now separated by normal skin and we've  redefined this. 11/11/17; still using Hydrofera Blue area and right lateral ankle continues to have a small open area on mostly the lateral part of the wound. Her original wound has 2 small open areas now separated by a considerable amount of normal skin 11/28/17; the patient called in slightly before Thanksgiving to report pain and erythema above the wound on the right leg. In the past this is responded well to treatment for cellulitis and I gave her over the phone doxycycline. She stated this resulted in fairly abrupt improvement. We have been using Hydrofera Blue for a prolonged period of time to the larger wound anteriorly into the remaining wound on her right right lateral ankle. The latter is just about closed with only a small linear area and the bottom of the [place]. 12/02/17; use endoform and left the dressing on since last visit. There is no tenderness and no evidence of infection. 12/16/17; patient has been using Endoform but not making much  progress. The 2 punched out open areas anteriorly which were the reminiscence of her major wound appear deeper. The area on the lateral aspect of her right calf also appears deeper. Also she has a puzzling tender swelling above her wound on the right leg. This seems larger than last time. Just above her wounds there appears to be some fluctuance in this area it is not erythematous and there is no crepitus 12/30/17; patient has been using Endoform up until last week we used Hydrofera Blue. Ultrasound of the swelling above her 2 major wounds last time was negative for a fluid collection. I gave her cefaclor for the erythema and tenderness in this area which seems better. Unfortunately both punched out areas anteriorly and the area on her right medial lower leg appear deeper. In fact the lateral of the wounds anteriorly actually looks as though it has exposed tendon and/or muscle sheath. She is not systemically unwell. She is complaining of vaginitis  type symptoms presumably Candida from her antibiotics. 01/06/18; we're using santyl. she has 2 punched out areas anteriorly which were initially part of a large wound. Unfortunately medially this is now open to tendon/muscle. All the wounds have the same adherent very difficult to debride surface. 01/20/18; 2 week follow-up using Santyl. She has the 2 punched out areas anteriorly which were initially part of her large surface wound there. Medially this still has exposed muscle. All of these have the same tightly adherent necrotic surface which requires debridement. PuraPly was not accepted by the patient's insurance however her insurance I think it changed therefore we are going to run Apligraf to gain 02/03/18; the patient has been using Santyl. The wound on the right lateral ankle looks improved and the 2 areas anteriorly on the right leg looks about the same. The medial one has exposed muscle. The lateral 1 requiresdebridement. We use PuraPly today for the first week 02/10/18; PuraPly #2. The patient has 3 wounds. The area on the right lateral ankle, 2 areas anteriorly that were part of her original large wound in this area the medial one has exposed muscle. All of the wounds were lightly debrided with a number 3 curet. PuraPly #2 applied the lateral wound on the calf and the right lateral ankle look better. 02/17/18; PuraPly #3. Patient has 3 wounds. The area on the right lateral ankle in 2 areas internally that were part of her original large wound. The lateral area has exposed muscle. She arrived with some complaints of pain around the right ankle. 02/24/18; PuraPly #4; not much change in any of the 3 wound areas. Right lateral ankle, right lateral calf. Both of these required debridement with a #3 curet. She tolerates this marginally. The area on the medial leg still has exposed muscle. Not much change in dimensions 03/03/18 PuraPly #5. The area on the medial ankle actually looks better however the  2 separate areas that were original parts of the larger right anterior leg wound look as though they're attempting to coalesce. 03/10/18; PuraPly #6. The area on the medial ankle actually continues to look and measures smaller however the 2 separate areas that were part of the original large wound on the right anterior leg have now coalesced. There hasn't been much improvement here. The lateral area actually has underlying exposed muscle 03/17/18-she is here in follow-up evaluation for ulcerations to the right lower extremity. She is voicing no complaints or concerns. She tolerated debridement. Puraply#7 placed 03/24/18; difficult right lower extremity ulcerations. PuraPly #8 place.  She is been approved for [employer]. She did very well with Apligraf today however she is apparently reached her "lifetime max" 03/31/18; marginal improvement with PuraPly although her wounds looked as good as they have in several weeks today. Used TheraSkins #1 04/14/18 TheraSkin #2 today 04/28/18 TheraSkins #3. Wound slightly improved 05/12/18; TheraSkin skin #4. Wound response has been variable. 05/27/18 TheraSkin #5. Generally improvement in all wound areas. I've also put her in 3 layer compression to help with the severe venous hypertension 06/09/18; patient is done quite well with the TheraSkins unfortunately we have no further applications. I also put her in 3 layer compression last week and that really seems to of helped. 06/16/18; we have been using silver collagen. Wounds are smaller. Still be open area to the muscle layer of her calf however even that is contracted somewhat. She tells me that at night sometimes she has pain on the right lateral calf at the site of her lower wound. Notable that I put her into 3 layer compression about 3 weeks ago. She states that she dangles her leg over the bed that makes it feel better but she does not describe claudication during the day She is going to call her secondary  insurance to see if they will continue to cover advanced treatment products I have reviewed her arterial studies from 01/22/17; this showed an ABI in the right of 1 and on the left noncompressible. TBI on the right at 0.30 on the left at 0.34. It is therefore possible she has significant PAD with medial calcification falsely elevating her ABI into the normal range. I'll need to be careful about asking her about this next week it's possible the 3 layer compression is too much 06/23/18; was able to reapply TheraSkin 1 today. Edema control is good and she is not complaining of pain no claudication 07/07/18;no major change. New wound which was apparently a taper removal injury today in our clinic between her 2 wounds on the right calf TheraSkins #2 07/14/18; I think there is some improvement in the right lateral ankle and the medial part of her wound. There is still exposed muscle medially. 07/28/18; two-week follow-up. TheraSkins#3. Unfortunately no major change. She is not a candidate I don't think for skin grafting due to severe venous hypertension associated with her scleroderma and pulmonary hypertension 08/11/18 Patient is here today for her Theraskin application #12 (#5 of the second set). She seems to be doing well and in the base of the wound appears to show some progress at this point. This is the last approved Theraskin of the second set. 08/25/18; she has completed TheraSkin. There has been some improvement on the right lateral calf wound as well as the anterior leg wounds. The open area to muscle medially on the anterior leg wound is smaller. I'm going to transition her back to [facility] under Kerlix Coban change every second day. She reports that she had some calcification removed from her right upper arm. We have had previous problems with calcifications in her wounds on her legs but that has not happened recently 09/08/18;using Hydrofera Blue on both her wound areas. Wounds seem to of  contracted nicely. She uses Kerlix Coban wrap and changes at home herself 09/22/2018; using Hydrofera Blue on both her wound areas. Dimensions seem to have come down somewhat. There is certainly less depth in the medial part of the mid tibia wound and I do not think there is any exposed muscle at this point. 10/06/2018; 2-week follow-up.  Using [facility] on her wound areas. Dimensions have come down nicely both on the right lateral ankle area in the right mid tibial area. She has no new complaints 10/20/2018; 2-week follow-up. She is using Hydrofera Blue. Not much change from the last time she was here. The area on the lateral ankle has less depth although it has raised edges on one side. I attempted to remove as much of the raised edge as I could without creating more additional wounds. The area on the right anterior mid tibia area looks the same. 11/03/2018; 2-week follow-up she is using Hydrofera Blue. On the right anterior leg she now has 2 wounds separated by a large area of normal skin. The area on the medial part still has I think exposed muscle although this area itself is a lot smaller. The area laterally has some depth. Both areas with necrotic debris. The area on her right lateral ankle has come in nicely 11/17/2018; patient continues to use Hydrofera Blue. We have been increasing separation of the 2 wounds anteriorly which were at one point joint the area on the right lateral calf continues to have I think some improvement in depth. 12/08/2018; patient continues to use Hydrofera Blue. There is some improvement in the area on the right lateral calf. The 2 areas that were initially part of the original large wound in the mid right tibia are probably about the same. In fact the medial area is probably somewhat larger. We will run puraply through the patient's insurance 12/22/2018; she has been using Hydrofera Blue. We have small wounds on the right lateral calf and 2 small areas that  were initially part of a large wound in the right mid tibia. We applied pure apply #1 today. 12/29/2018; we applied puraply #2. Her wounds look somewhat better especially on the right lateral calf and the lateral part of her original wound in the mid tibial area. 01/05/2019; perhaps slightly improved in terms of wound bed condition but certainly not as much improvement as I might of liked. Puraply #3 1/13: we did not have a correct sized puraply to apply. wounds more pinched out looking, I increased her compression to 3 layer last week to help with significant multilevel venous hypertension. Since then I've reviewed her arterial status. She has a right femoral endarterectomy and a distal left SFA stent. She was being followed by Dr. [name] for a period however she does not appear to have seen him in 3 years. I will set up an appointment. 1/20. The patient has an additional wound on the right lateral calf between the distal wound and proximal wounds. We did not have Puraply last week. Still does not have a follow-up with Dr. early 1/27: Follow-up with Dr. early has been arranged apparently with follow-up noninvasive studies. Wounds are measuring roughly the same although they certainly look smaller 2/3; the patient had non invasive studies. Her ABIs on the right were 0.83 and on the left 1.02 however there was no great toe pressure bilaterally. Also worrisome monophasic waveforms at the PTA and dorsalis pedis. We are still using Puraply. We have had some improvement in all of the wounds especially the lateral part of the mid tibial area. 2/10; sees Dr. early of vein and vascular re-arterial studies next week. Puraply reapplied today. 2/17; Dr. early of vein and vascular his appointment is tomorrow. Puraply reapplied after debridement of all wounds 2/24; the patient saw Dr. early I reviewed his note. sHe noted the previous right femoral  endarterectomy with a Dacron patch. He also noted the normal ABI  and the monophasic waveforms suggesting tibial disease. Overall he did not feel that she had any evidence of arterial insufficiency that would impair her wound healing. He did note her venous disease as well. He suggested PRN follow-up. 3/2; I had the last puraply applied today. The original wounds over the mid tibia area are improved where is the area on the right lower leg is not 3/9; wounds are smaller especially in the right mid tibia perhaps slightly in the right lateral calf. We finished with puraply and went to endoform today 3/23; the patient arrives after 2 weeks. She has been using endoform. I think all of her wounds look slightly better which includes the area on the right lateral calf just above the right lateral malleolus and the 2 in the right mid tibia which were initially part of the same wound. 4/27; TELEHEALTH visit; the patient was seen for telehealth visit today with her consent in the middle of the worldwide epidemic. Since she was last here she called in for antibiotics with pain and tenderness around the area on the right medial ankle. I gave her empiric doxycycline. She states this feels better. She is using endoform on both of these areas 5/11 TELEHEALTH; the patient was seen for telehealth visit today. She was accompanied at home by her husband. She has severe pulmonary hypertension accompanied scleroderma and in the face of the Covid epidemic cannot be safely brought into our clinic. We have been using endoform on her wound areas. There are essentially 3 wound areas now in the left mid tibia now 2 open areas that it one point were connected and one on the right lateral ankle just above the malleolus. 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 She cannot come to clinic. The wounds were reviewed  today via telehealth. She has severe chronic venous hypertension which I think is centrally mediated. She has wounds on her right anterior tibia and right lateral ankle area. These are chronic. She has been using endoform. 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 We have been following her from telehealth. She has severe chronic venous hypertension which may be mostly centrally mediated secondary to right heart heart failure. She has wounds on her right anterior tibia and right lateral ankle these are chronic we have been using endoform 6/22; TELEHEALTH; this patient was seen today via telehealth. 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 Since the last time we had contact with this patient she developed some pain and erythema around the wound on her right lateral malleolus/ankle and we put in antibiotics for her. This is resulted in good improvement with resolution of the erythema and tenderness. I changed her to silver alginate last time. We had been using endoform for an extended period of time 7/13; TELEHEALTH; this patient was seen today via telehealth. 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 She would continue to be at a prohibitive risk to be brought into our clinic unless this was absolutely necessary. These visits have been done with her approval as well  as her husband. We have been using silver alginate to the areas on the right mid tibia and right lateral lower leg. 7/27 TELEHEALTH; patient was seen along with her husband today via telehealth. 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 We changed her to sample last visit. She has 2  areas a chronic wound on her right mid tibia and one just above her ankle. These were not the original wounds when she came into this clinic but she developed them during treatment 8/17; she comes in for her first face-to-face visit in a long period. She has a remaining area just medial to the right tibia which is the last open part of her large wound across this area. She also has an area on the right lateral lower leg. We prescribed Santyl last telehealth visit but they were concerned that this was making a deeper so they put silver alginate on it last week. Her husband changes the dressings. 8/31; using Santyl to the 2 wound areas some improvement in wound surfaces. Husband has surgery in 2 weeks we will put her out 3 weeks. Any of the advanced treatment options that I can think of that would be eligible for this wound would also cause her to have to come in weekly. The risk that the patient is just too high 9/21. Using Santyl to the 2 wound areas. Both of these are somewhat better although the medial mid tibia area still has exposed muscle. Lateral ankle requiring debridement. Using Santyl 10/12; using Santyl to the 2 wound areas. One on the right lateral ankle and the other in the medial calf which still has exposed muscle. Both areas have come down in size and have better looking surfaces. She has made nice progress with santyl Electronic Signature(s) Signed: 10/15/2019 5:35:18 PM By: Linton Ham MD Signed: 10/16/2019 6:00:55 PM By: Levan Hurst RN, BSN Previous Signature: 10/12/2019 5:50:55 PM Version By: Linton Ham MD Entered By: Levan Hurst on 10/14/2019 10:44:19 -------------------------------------------------------------------------------- Physical Exam Details Patient Name: Date of Service: [name] [name], [name] [name] 10/12/2019 8:30 AM Medical Record Number:[id number] Patient Account Number: [id number] Date of Birth/Sex: Treating RN: 1951/04/05 (68 y.o. [name] Primary Care Provider: [name] Other Clinician: Referring Provider: Treating Provider/Extender:Robson, Leotis Shames, MARY Weeks in Treatment: 394 Constitutional Sitting or standing Blood Pressure is within target range for patient.. Pulse regular and within target range for patient.Marland Kitchen Respirations regular, non-labored and within target range.. Temperature is normal and within the target range for the patient.Marland Kitchen Appears in no distress. Notes Wound exam; right mid calf the lateral part of this has closed. Medial point is smaller still surface debris and still exposed muscle. Lightly debrided with a #3 curette. Right lateral lower ankle also requires debridement although there is a healthy looking surface on this Electronic Signature(s) Signed: 10/15/2019 5:35:18 PM By: Linton Ham MD Signed: 10/16/2019 6:00:55 PM By: Levan Hurst RN, BSN Previous Signature: 10/12/2019 5:50:55 PM Version By: Linton Ham MD Entered By: Levan Hurst on 10/14/2019 10:45:37 -------------------------------------------------------------------------------- Physician Orders Details Patient Name: Date of Service: OVETA, IDRIS 10/12/2019 8:30 AM Medical Record Number:[id number] Patient Account Number: [id number] Date of Birth/Sex: Treating RN: August 22, 1951 (68 y.o. [name] Primary Care Provider: [name] Other Clinician: Referring Provider: Treating Provider/Extender:Robson, Leotis Shames, MARY Weeks in Treatment: [phone] Verbal / Phone Orders: No Diagnosis Coding Follow-up Appointments Return appointment in 3 weeks. Dressing Change Frequency Change dressing every day. -  all wounds Skin Barriers/Peri-Wound Care Moisturizing lotion - both legs Wound Cleansing May shower and wash wound with soap and water. Primary Wound Dressing Wound #14 Right,Medial,Anterior Lower Leg Santyl Ointment Wound #5 Right,Medial Lower Leg Santyl Ointment Wound #7 Right,Lateral  Malleolus Santyl Ointment Secondary Dressing Wound #14 Right,Medial,Anterior Lower Leg Dry Gauze Wound #5 Right,Medial Lower Leg Dry Gauze Wound #7 Right,Lateral Malleolus Dry Gauze Edema Control Kerlix and Coban - Right Lower Extremity Avoid standing for long periods of time Elevate legs to the level of the heart or above for 30 minutes daily and/or when sitting, a frequency of: - throughout the day Electronic Signature(s) Signed: 10/12/2019 5:18:03 PM By: Kela Millin Signed: 10/12/2019 5:50:55 PM By: Linton Ham MD Entered By: Kela Millin on 10/12/2019 09:25:51 -------------------------------------------------------------------------------- Problem List Details Patient Name: Date of Service: [name] [name]. 10/12/2019 8:30 AM Medical Record Number:[id number] Patient Account Number: [id number] Date of Birth/Sex: Treating RN: 06/13/1951 (68 y.o. 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 02/24/2015 No Yes of calf and inflammation I87.331 Chronic venous hypertension (idiopathic) with ulcer 10/04/2016 No Yes and inflammation of right lower extremity Inactive Problems ICD-10 Code Description Active Date Inactive Date 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/19/2016 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 [name] Primary Care Provider: [name] Other Clinician: Referring Provider: Treating Provider/Extender:Robson, Leotis Shames, MARY Weeks in Treatment: 394 Subjective History of Present Illness (HPI) this is a patient who initially came to [place] for wounds on the medial malleoli bilaterally as well as her upper medial lower extremities bilaterally. These wounds eventually healed with assistance of Apligraf's bilaterally. While this was occurring she developed the current wound which opened into a fairly substantial wound on the right lower extremity. These are mostly secondary to venous stasis physiology however the patient also has underlying scleroderma, pulmonary hypertension. The wound has been making good progress lately with the Hydrofera Blue-based dressing. 03/17/2015; patient had a arterial evaluation a year ago. Her right ABI was 0.86 left was 1.0. Toe brachial index was 0.41 on the right 0.45 on the left. Her bilateral common femoral artery waveforms were triphasic. Her white popliteal posterior tibial artery and anterior tibial artery waveforms were monophasic with good amplitude. Luteal artery waveforms were biphasic it was felt that her bilateral great toe pressures are of normal although adequate for tissue healing. 03/24/2015. The condition of this wound is not really improved that. He was covered with as fibrinous surface slough and eschar. This underwent an aggressive debridement with both a curette and scalpel. I still cannot really get down to what I can would consider to be a  viable surface. There is no evidence of infection. Previous workup for ischemia roughly a year ago was negative nevertheless I  think that continues to be a concern 04/07/15. The patient arrived for application of second Apligraf. Once again the surface of this wound is certainly less viable than I would like for an advanced treatment option. An aggressive debridement was done. She developed some arteriolar bleeding which required that along pressure and silver alginate. 04/21/15: change in the condition of this wound. Once again it is covered in a gelatinous surface slough. After debridement today surface of the wound looks somewhat better but now a heeling surface 05/05/15 Apligraf #4 applied.Still a lot of slough on this wound. 05/19/15 Apligraf #5 applied. Still a lot of slough on this wound I did not aggressively remove this 06/02/15; continued copious amounts of surface slough. This debridement fairly easily. 06/08/2015 -- the last time she had a venous duplex study done was over 3 years ago and the surgery was prior to that. I have recommended that she sees Dr. early for a another opinion regarding a repeat venous duplex and possibly more endovenous ablation of vein stripping of micro-phlebotomies. 06/16/15; wound has a gelatinous surface eschar that the debridement fairly easily to a point. I don't disagree with the venous workup and perhaps even arterial re-evaluation. She is on prednisone 5 mg and continue his medications for her pulmonary artery hypertension I am not sure if the latter have any wound care healing issues I would need to investigate this. 06/23/15 continues with a gelatinous surface eschar with of fibrinous underlying. What I can see of this wound does not look unhealthy however I just can't get this material which I think is 2 different layers off. Empiric culture done 06/30/15; unfortunately not a lot of change in this wound. A gelatinous surface eschar is easily removed however  it has a tight fibrinous surface underneath the. 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 The wound comes back and basically and unchanged state the. She has a gelatinous surface that is more easily removed however there is a tightly fibrinous surface underneath the. There is no evidence of infection. She has a vascular follow-up next month. I would have to inject her in order to do a more aggressive debridement of this area 07/21/15 the wound is roughly in the same state albeit the debridement is done with greater ease. There is less of the fibrinous eschar underneath the. There is no evidence of infection. She has follow-up with vascular surgery next week. No evidence of surrounding infection. Her original distal wounds healed while this one formed. 07/28/15; wound is easier to debride. No wound erythema. She is seeing Dr. [name] next week. 08/18/15 Has been to [place] for repeat ablation. Have been using medihoney pad with some improvement 08/25/15; absolutely no change in the condition of this wound in either its overall size or surface condition in many months now. At one point I had this down to [place] healthier surface I think with [facility] however this did not actually progress towards closure. Do not believe that the wound has actually deteriorated in terms of volume at all. We have been using a medihoney pad which allows easier debridement of the gelatinous eschar but again no overall actual improvement. the patient is going towards an ablation with pain and vascular which I think is scheduled for next week. The only other investigation that I could first see would be a biopsy. She does have underlying scleroderma 09/02/15 eschar is much easier to debridement however the base of this does not look particularly  vibrant. We changed to Iodoflex. The patient had her ablation earlier this week 09/09/15 again the debridement over the base of this wound is easier and the  base of the wound looks considerably better. We will continue the Iodoflex. Dr. Tawni Millers has expressed his satisfaction with the result of her ablation 09/15/15 once again the wound is relatively free of surface eschar. No debridement was done today. It has a pale- looking base to it. although this is not as deep as it once was it seems to be expanding especially inferiorly. She has had recent venous ablation but this is no closer to healing.I've gone ahead and done a punch biopsy this from the inferior part of the wound close to normal skin 09/22/15: the wound is relatively free of surface eschar. There is some surrounding eschar. I'm not exactly sure at what level the surface is that I am seeing. 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 No evidence of atypical infection, malignancy. 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 No debridement was done 09/29/15; the wound surface is relatively free of surface eschar. There is eschar surrounding the walls of the wound. Aside from the improvement in the amount of surface slough. This wound has not progressed any towards closure. There is not even a surface that looks like there at this is ready. There is no evidence of any infection nor maligancy based on biopsy I did on 9/15. I continued with the Iodoflex however I am looking towards some alternative to try to promote some closure or filling in of this surface. Consider triple layer Oasis. Collagen did not result in adequate control of the surface slough 10/13/15; the patient was in hospital last week with severe anemia. The wound looks somewhat better after debridement although there is widening medially. There is no evidence of infection. 10/20/15; patient's wound on the right lateral lower leg is essentially unchanged. This underwent a light surface debridement and in general the debridement is easier and the surface looks improved. I noted in  doing this on the side of the wound what appears to be a piece of calcium deposition. The patient noted that she had previous things on the tips of her fingers. 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 She follows with rheumatology and I have asked them to talk to her about this. In view of that S the nonhealing ulcer may have something to do with calcinosis and also unrelieved Raynaud's disease in this area. I should note that her original wounds on the right and left medial malleolus and the inner aspects of both legs just below the knees did however heal over 10/30/15; the patient's wound on the right lateral lower leg is essentially unchanged. I was able to remove some calcified material from the medial wound edge. 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 Otherwise the wound appears essentially unchanged there is less adherent surface eschar. Some of the calcified material was sent to pathology for analysis 11/17/15. The patient's wound on the right lateral leg is essentially unchanged. Wider Medially. 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 There is some surface slough but I did not debridement this. No evidence of  surrounding infection. 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 This will make healing difficult 12/15/15; the wound has less depth surface has a fibrinous slough and calcifications in the wound edge no evidence of infection 12/22/15; the wound definitely has less Fibrinous slough on the base. Calcifications around the wound edges are still evident. Although the  wound bed looks healthier it is still pale in appearance. Previous biopsy did not show malignancy 01/04/15; surgical debridement of nonviable slough and subcutaneous tissue the wound cleans up quite nicely but appears to be expanding outward calcifications around the wound edges are still there. 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 She is to see her rheumatologist I'll see if he has any opinions on this. My punch biopsy done in September did not show calcifications although these are clearly evident. 01/19/16 light selective debridement of nonviable surface slough. There is epithelialization medially. This gives me reason for cautious optimism. She has been to see her rheumatologist, there is nothing that can be done for this type of soft tissue calcification associated with scleroderma 02/02/16 no debridement although there is a light surface slough. She has 2 peninsulas of skin 1 inferiorly and one medially. We continue to make a slight and slow but definite progressive here 02/16/16; light surface debridement with more attention to the circumference of the wound bed where the fibrinous eschar is more prevalent. No calcifications detected. She seems to have done nicely with the [facility] with some epithelialization and some improvement in the overall wound volume. 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 Continued attention to the circumference of the wound where the fibrinous eschar in calcinosis or prevalent. No calcifications were detected. She has continued improvement with Hydrofera Blue. The wound is no longer as deep 03/15/16 surgical debridement done to remove surface escha Especially around the circumference of the wound where there is nonviable subcutaneous tissue. In spite of this there is considerable  improvement in the overall dimensions and depth of the wound. Islands of epithelialization are seen especially medially inferiorly and superiorly to a lesser extent. She is using Hydrofera Blue at home 03/29/16; surgical debridement done to remove surface eschar and nonviable subcutaneous tissue. This cleans up quite nicely mention slightly larger in terms of length and width however depth is less 04/12/16; continued gradual improvement in terms of depth and the condition of the wound base. Debridement is done. Continuing long standing Hydrofera Blue at home with Kerlix Coban wraps 04/26/16; continued gradual improvement in terms of depth and management as well as condition of the wound base. Surgical debridement done she continues with Hydrofera Blue. 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 She initially came to this clinic venous insufficiency ulcers which have long since healed 05/17/16 continued improvement in terms of the depth and measurements of this wound although she has a tightly adherent fibrinous slough each time. We've been continued with long standing Hydrofera Blue which seems to done as well for this wound is many advanced treatment options. 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 She also has chronic venous insufficiency. She has an irritation on her lateral right ankle secondary to our wraps 05/10/16; wound appears to be smaller especially on the medial aspect and especially in the width. Wound was debridement surface  looks better. She is also been in the hospital apparently with anemia again she tells me she had an endoscopy. Since she got home after 3 days which I believe was sometime last week she has had an irritated painful area on the right lateral ankle surrounding the lateral malleolus 05/31/16; much more adherent surface slough today then recently although I don't think the dimensions of  changed that much. A more aggressive debridement is required. The irritated area over her medial malleolus is more pruritic and painful and I don't think represents cellulitis 06/14/16; no major change in her wound dimensions however there is more tightly adherent surface slough which is disappointing. As well as she appears to have a new small area medially. Furthermore an irritated uncomfortable area on the lateral aspect of her right foot just below the lateral malleolus. 06/21/16; I'm seeing the patient back in follow-up for the new areas under her major wound on the right anterior leg. She has been using Hydrofera Blue to this area probably for several months now and although the dressings seem to be helping for quite a period of time I think things have stagnated lately. She comes in today with a relatively tight adherent surface slough and really no changes in the wound shape or dimension. The 2 small areas she had inferiorly are tiny but still open they seem improved this well. There is no uncontrolled edema and I don't think there is any evidence of cellulitis. 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 Patient has had vascular evaluations both venous and arterial. I have biopsied this area. There is no obvious infection. The worrisome thing today is that she seems to be developing areas of erythema and epithelial damage on the medial aspect of the right foot. Also to a lesser degree inferior to the actual wound itself. Again I see no obvious changes to suggest cellulitis however as this is the only treatable option I will probably give her antibiotics. 07/13/16 no major change in the lady's large anterior right leg wound. Still covered with a very tightly adherent surface slough which is difficult to debridement. There is less erythema around this, culture last week grew pseudomonas  I gave her ciprofloxacin. The area on her lateral right malleolus looks better- 08/02/16 the patient's wounds continued to decline. Her original large anterior right leg wound looks deeper. Still adherent surface slough that is difficult to debridement. She has a small area just below this and a punched-out wound just below her lateral malleolus. In the meantime she is been in hospital with apparently an upper GI bleed on Plavix and aspirin. She is now just on Plavix she received 3 units of packed cells 08/23/16; since I last saw this 3 weeks ago, the open large area on her right leg looks about the same syrup. She has a small satellite lesion just underneath this. The area on her medial right ankle is now a deep necrotic wound. I attempted to debridement this however there is just too much pain. 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 She follows with vascular surgery and is scheduled for an angiogram in early September 09/06/16; the patient is going for an arteriogram tomorrow. Her original large wound on the right calf is about the same the satellite lesion underneath it is about the same however  the area on her medial ankle is now deeper with exposed tendon. I am no longer attempting to debride these wounds 09/20/16; the patient has undergone a right femoral endarterectomy and Dacron patch angioplasty. This seems to have helped the flow in her right leg. 10/04/16; Arrived today for aggressive debridement of the wounds on her right calf the original wound the one beneath it and a difficult area over her right lateral leg just above the lateral malleolus. 10/25/16; her 3 open wounds are about the same in terms of dimensions however the surface appears a lot healthier post debridement. Using Iodoflex 10/18/16 we have been using Iodoflex to her wounds which she tolerated with some difficulty. 10/11/16; has been using Santyl for  a period of time with some improvement although again very adherent surface slough would prevent any attempted healing this. She has a original wound on the left calf, the satellite underneath that and the most recent wound on the right medial ankle. She has completed revascularization by Dr. early and has had venous ablation earlier. Want to go back to Iodoflex to see if week and get a healthier surface to this wound bed failing this I think she'll need to be taken to the OR and I am prepared to call Dr. Marla Roe to discuss this. She is obviously not a good candidate for general anesthesia however.; 11/08/16; I put her on Iodoflex last time to see if I can get the wound bed any healthier and unfortunately today still had tremendous surface slough. 11/15/16; 4 weeks' worth of Iodoflex with not much improvement. Debridement on the major wounds on her left anterior leg is easier however this does not maintain from week to week. The punched-out area on her medial right ankle 11/29/16; I attempted to change the patient last visit to [facility] however she states this burned and was very uncomfortable therefore we gave her permission to go back to the eye out of complex which she already had at home. Also she noted a lot of pain and swelling on the lateral aspect of her leg before she traveled to [facility] for the holidays, I called her in doxycycline over the phone. This seems to have helped 12/06/16; Wounds unchanged by in large. Using Iodoflex 12/13/16; her wounds today actually looks somewhat better. The area on the right lateral lower leg has reasonably healthy-looking granulation and perhaps as actually filled and a bit. Debridement of the 2 wounds on the medial calf is easier and post debridement appears to have a healthier base. We have referred her to Dr. Migdalia Dk for consideration of operative debridement 12/20/16; we have a quick note from Dr. Merri Ray who feels  that the patient needs to be referred to an academic center/plastic surgeon. This is due to the complexity of the patient's medical issues as it applies to anything in the OR. We have been using Iodoflex 12/27/16; in general the wounds on the right leg are better in terms of the difficult to remove surface slough. She has been using Iodoflex. She is approved for Apligraf which I anticipated ordering in the next week or 2 when we get a better-looking surface 01/04/16 the deep wounds on the right leg generally look better. Both of them are debrided further surface slough. The area on the lateral right leg was not debrided. She is approved for Apligraf I think I'll probably order this either next week or the week after depending on the surface of the wounds superiorly. We have been using  Iodoflex which will continue until then 01/11/16; the deep wounds on the right leg again have a surface slough that requires debridement. I've not been able to get the wound bed on either one of these wounds down to what would be acceptable for an advanced skin stab- like Apligraf. The area on the medial leg has been improving. We have been using hot Iodoflex to all wounds which seems to do the best at at least limiting the nonviable surface 01/24/17; we have continued Iodoflex and all her wound areas. Her debridement Gen. he is easier and the surface underneath this looks viable. Nevertheless these are large area wounds with exposed muscle at least on the anterior parts. We have ordered Apligraf's for 2 weeks from now. The patient will be away next week 02/07/17; the patient was close to have first Apligraf today however we did not order one. I therefore replaced her Iodoflex. She essentially has 3 large punched-out areas on her right anterior leg and right medial ankle. 02/11/17; Apligraf #1 02/25/17; Apligraf #2. In general some improvement in the right medial ankle and right anterior leg wounds. The larger wound  medially perhaps some better 03/11/17; Apligraf #3. In general continued improvement in the right medial ankle and the right anterior leg wounds. 03/25/17 Apligraf #4. In general continued improvement especially on the right medial ankle and the lower 04/08/17; Apligraf #5 in general continued improvement in all wound sites. 04/22/17; post Apligraf #5 her wound beds continued to look a lot better all of them up to the surface of the surrounding skin. Had a caramel-colored slough that I did not debrided in case there is residual Apligraf effect. The wounds are as good as I have seen these looking quite some time. 04/29/17; we applied [place] and last week after completing her Apligraf. Wounds look as though they've contracted somewhat although they have a nonviable surface which was problematic in the past. Apparently she has been approved for further Apligraf's. We applied Iodosorb today after debridement. 05/06/17; we're fortunate enough today to be able to apply additional Apligraf approved by her insurance. In general all of her wounds look better Apligraf #6 05/24/17; Apligraf #7 continued improvement in all wounds o3 06/10/17 Apligraf #8. Continued improvement in the surface of all wounds. Not much of an improvement in dimensions as I might a follow 06/24/17 Apligraf #9 continued general improvement although not as much change in the wound areas I might of like. She has a new open area on the right anterior lateral ankle very small and superficial. She also has a necrotic wound on the tip of her right index finger probably secondary to severe uncontrolled Raynaud's phenomenon. She is already on sildenafil and already seen her rheumatologist who gave her Keflex. 07/08/17; Apligraf #10. Generalized improvement although she has a small additional wound just medial to the major wound area. 07/22/17; after discussion we decided not to reorder any further Apligraf's although there is been  considerable improvement with these it hasn't been recently. The major wound anteriorly looks better. Smaller wounds beneath this and the more recent one and laterally look about the same. The area on the right lateral lower leg looks about the same 07/29/17 this is a patient who is exceedingly complex. She has advanced scleroderma, crest syndrome including calcinosis, PAD status post revascularization, chronic venous insufficiency status post ablations. She initially presented to this clinic with wounds on her bilateral lower legs however these closed. More recently we have been dealing with a large  open area superiorly on the right anterior leg, a smaller wound underneath this and then another one on the right medial lower extremity. These improve significantly with 10 Apligraf applications. Over the last 2-3 weeks we are making good progress with Hydrofera Blue and these seem to be making progress towards closure 08/19/17; wounds continued to make good improvement with Hydrofera Blue and episodic aggressive debridements 08/26/17; still using Hydrofera Blue. Good improvements 09/09/17; using Hydrofera Blue continued improvement. Area on the lateral part of her right leg has only a small remaining open area. The small inferior satellite region is for all intents and purposes closed. Her major wound also is come in in terms of depth and has advancing epithelialization. 09/16/17; using Hydrofera Blue with continued improvement. The smaller satellite wound. We've closed out today along with a new were satellite wound medially. The area on her medial ankle is still open and her major wound is still open but making improvement. All using Hydrofera Blue. Currently 09/30/17; using Hydrofera Blue. Still a small open area on the lateral right ankle area and her original major wound seems to be making gradual and steady improvement. 10/14/17; still using Hydrofera Blue. Still too small open areas on the right  lateral ankle. Her original major wound is horizontal and linear. The most problematic area paradoxically seems to be the area to the medial area wears I thought it would be the lateral. 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 10/28/17; still using Hydrofera Blue. Right lateral ankle has a very small open area superiorly on the most lateral part of the wound. Her original open wound has 2 open areas now separated by normal skin and we've redefined this. 11/11/17; still using Hydrofera Blue area and right lateral ankle continues to have a small open area on mostly the lateral part of the wound. Her original wound has 2 small open areas now separated by a considerable amount of normal skin 11/28/17; the patient called in slightly before Thanksgiving to report pain and erythema above the wound on the right leg. In the past this is responded well to treatment for cellulitis and I gave her over the phone doxycycline. She stated this resulted in fairly abrupt improvement. We have been using Hydrofera Blue for a prolonged period of time to the larger wound anteriorly into the remaining wound on her right right lateral ankle. The latter is just about closed with only a small linear area and the bottom of the [place]. 12/02/17; use endoform and left the dressing on since last visit. There is no tenderness and no evidence of infection. 12/16/17; patient has been using Endoform but not making much progress. The 2 punched out open areas anteriorly which were the reminiscence of her major wound appear deeper. The area on the lateral aspect of her right calf also appears deeper. Also she has a puzzling tender swelling above her wound on the right leg. This seems larger than last time. Just above her wounds there appears to be some fluctuance in this area it is not erythematous and there is no crepitus 12/30/17; patient has been using Endoform up until last  week we used Hydrofera Blue. Ultrasound of the swelling above her 2 major wounds last time was negative for a fluid collection. I gave her cefaclor for the erythema and tenderness in this area which seems better. Unfortunately both punched out areas anteriorly and the area on her right medial lower leg appear deeper. In  fact the lateral of the wounds anteriorly actually looks as though it has exposed tendon and/or muscle sheath. She is not systemically unwell. She is complaining of vaginitis type symptoms presumably Candida from her antibiotics. 01/06/18; we're using santyl. she has 2 punched out areas anteriorly which were initially part of a large wound. Unfortunately medially this is now open to tendon/muscle. All the wounds have the same adherent very difficult to debride surface. 01/20/18; 2 week follow-up using Santyl. She has the 2 punched out areas anteriorly which were initially part of her large surface wound there. Medially this still has exposed muscle. All of these have the same tightly adherent necrotic surface which requires debridement. PuraPly was not accepted by the patient's insurance however her insurance I think it changed therefore we are going to run Apligraf to gain 02/03/18; the patient has been using Santyl. The wound on the right lateral ankle looks improved and the 2 areas anteriorly on the right leg looks about the same. The medial one has exposed muscle. The lateral 1 requiresdebridement. We use PuraPly today for the first week 02/10/18; PuraPly #2. The patient has 3 wounds. The area on the right lateral ankle, 2 areas anteriorly that were part of her original large wound in this area the medial one has exposed muscle. All of the wounds were lightly debrided with a number 3 curet. PuraPly #2 applied the lateral wound on the calf and the right lateral ankle look better. 02/17/18; PuraPly #3. Patient has 3 wounds. The area on the right lateral ankle in 2 areas internally that  were part of her original large wound. The lateral area has exposed muscle. She arrived with some complaints of pain around the right ankle. 02/24/18; PuraPly #4; not much change in any of the 3 wound areas. Right lateral ankle, right lateral calf. Both of these required debridement with a #3 curet. She tolerates this marginally. The area on the medial leg still has exposed muscle. Not much change in dimensions 03/03/18 PuraPly #5. The area on the medial ankle actually looks better however the 2 separate areas that were original parts of the larger right anterior leg wound look as though they're attempting to coalesce. 03/10/18; PuraPly #6. The area on the medial ankle actually continues to look and measures smaller however the 2 separate areas that were part of the original large wound on the right anterior leg have now coalesced. There hasn't been much improvement here. The lateral area actually has underlying exposed muscle 03/17/18-she is here in follow-up evaluation for ulcerations to the right lower extremity. She is voicing no complaints or concerns. She tolerated debridement. Puraply#7 placed 03/24/18; difficult right lower extremity ulcerations. PuraPly #8 place. She is been approved for [employer]. She did very well with Apligraf today however she is apparently reached her "lifetime max" 03/31/18; marginal improvement with PuraPly although her wounds looked as good as they have in several weeks today. Used TheraSkins #1 04/14/18 TheraSkin #2 today 04/28/18 TheraSkins #3. Wound slightly improved 05/12/18; TheraSkin skin #4. Wound response has been variable. 05/27/18 TheraSkin #5. Generally improvement in all wound areas. I've also put her in 3 layer compression to help with the severe venous hypertension 06/09/18; patient is done quite well with the TheraSkins unfortunately we have no further applications. I also put her in 3 layer compression last week and that really seems to of  helped. 06/16/18; we have been using silver collagen. Wounds are smaller. Still be open area to the muscle layer of her  calf however even that is contracted somewhat. She tells me that at night sometimes she has pain on the right lateral calf at the site of her lower wound. Notable that I put her into 3 layer compression about 3 weeks ago. She states that she dangles her leg over the bed that makes it feel better but she does not describe claudication during the day ooShe is going to call her secondary insurance to see if they will continue to cover advanced treatment products I have reviewed her arterial studies from 01/22/17; this showed an ABI in the right of 1 and on the left noncompressible. TBI on the right at 0.30 on the left at 0.34. It is therefore possible she has significant PAD with medial calcification falsely elevating her ABI into the normal range. I'll need to be careful about asking her about this next week it's possible the 3 layer compression is too much 06/23/18; was able to reapply TheraSkin 1 today. Edema control is good and she is not complaining of pain no claudication 07/07/18;no major change. New wound which was apparently a taper removal injury today in our clinic between her 2 wounds on the right calf TheraSkins #2 07/14/18; I think there is some improvement in the right lateral ankle and the medial part of her wound. There is still exposed muscle medially. 07/28/18; two-week follow-up. TheraSkins#3. Unfortunately no major change. She is not a candidate I don't think for skin grafting due to severe venous hypertension associated with her scleroderma and pulmonary hypertension 08/11/18 Patient is here today for her Theraskin application #40 (#5 of the second set). She seems to be doing well and in the base of the wound appears to show some progress at this point. This is the last approved Theraskin of the second set. 08/25/18; she has completed TheraSkin. There has been  some improvement on the right lateral calf wound as well as the anterior leg wounds. The open area to muscle medially on the anterior leg wound is smaller. I'm going to transition her back to [facility] under Kerlix Coban change every second day. She reports that she had some calcification removed from her right upper arm. We have had previous problems with calcifications in her wounds on her legs but that has not happened recently 09/08/18;using Hydrofera Blue on both her wound areas. Wounds seem to of contracted nicely. She uses Kerlix Coban wrap and changes at home herself 09/22/2018; using Hydrofera Blue on both her wound areas. Dimensions seem to have come down somewhat. There is certainly less depth in the medial part of the mid tibia wound and I do not think there is any exposed muscle at this point. 10/06/2018; 2-week follow-up. Using [facility] on her wound areas. Dimensions have come down nicely both on the right lateral ankle area in the right mid tibial area. She has no new complaints 10/20/2018; 2-week follow-up. She is using Hydrofera Blue. Not much change from the last time she was here. The area on the lateral ankle has less depth although it has raised edges on one side. I attempted to remove as much of the raised edge as I could without creating more additional wounds. The area on the right anterior mid tibia area looks the same. 11/03/2018; 2-week follow-up she is using Hydrofera Blue. On the right anterior leg she now has 2 wounds separated by a large area of normal skin. The area on the medial part still has I think exposed muscle although this area itself is  a lot smaller. The area laterally has some depth. Both areas with necrotic debris. ooThe area on her right lateral ankle has come in nicely 11/17/2018; patient continues to use Hydrofera Blue. We have been increasing separation of the 2 wounds anteriorly which were at one point joint the area on the right lateral  calf continues to have I think some improvement in depth. 12/08/2018; patient continues to use Hydrofera Blue. There is some improvement in the area on the right lateral calf. The 2 areas that were initially part of the original large wound in the mid right tibia are probably about the same. In fact the medial area is probably somewhat larger. We will run puraply through the patient's insurance 12/22/2018; she has been using Hydrofera Blue. We have small wounds on the right lateral calf and 2 small areas that were initially part of a large wound in the right mid tibia. We applied pure apply #1 today. 12/29/2018; we applied puraply #2. Her wounds look somewhat better especially on the right lateral calf and the lateral part of her original wound in the mid tibial area. 01/05/2019; perhaps slightly improved in terms of wound bed condition but certainly not as much improvement as I might of liked. Puraply #3 1/13: we did not have a correct sized puraply to apply. wounds more pinched out looking, I increased her compression to 3 layer last week to help with significant multilevel venous hypertension. Since then I've reviewed her arterial status. She has a right femoral endarterectomy and a distal left SFA stent. She was being followed by Dr. [name] for a period however she does not appear to have seen him in 3 years. I will set up an appointment. 1/20. The patient has an additional wound on the right lateral calf between the distal wound and proximal wounds. We did not have Puraply last week. Still does not have a follow-up with Dr. early 1/27: Follow-up with Dr. early has been arranged apparently with follow-up noninvasive studies. Wounds are measuring roughly the same although they certainly look smaller 2/3; the patient had non invasive studies. Her ABIs on the right were 0.83 and on the left 1.02 however there was no great toe pressure bilaterally. Also worrisome monophasic waveforms at the PTA and  dorsalis pedis. We are still using Puraply. We have had some improvement in all of the wounds especially the lateral part of the mid tibial area. 2/10; sees Dr. early of vein and vascular re-arterial studies next week. Puraply reapplied today. 2/17; Dr. early of vein and vascular his appointment is tomorrow. Puraply reapplied after debridement of all wounds 2/24; the patient saw Dr. early I reviewed his note. sHe noted the previous right femoral endarterectomy with a Dacron patch. He also noted the normal ABI and the monophasic waveforms suggesting tibial disease. Overall he did not feel that she had any evidence of arterial insufficiency that would impair her wound healing. He did note her venous disease as well. He suggested PRN follow-up. 3/2; I had the last puraply applied today. The original wounds over the mid tibia area are improved where is the area on the right lower leg is not 3/9; wounds are smaller especially in the right mid tibia perhaps slightly in the right lateral calf. We finished with puraply and went to endoform today 3/23; the patient arrives after 2 weeks. She has been using endoform. I think all of her wounds look slightly better which includes the area on the right lateral calf just above the  right lateral malleolus and the 2 in the right mid tibia which were initially part of the same wound. 4/27; TELEHEALTH visit; the patient was seen for telehealth visit today with her consent in the middle of the worldwide epidemic. Since she was last here she called in for antibiotics with pain and tenderness around the area on the right medial ankle. I gave her empiric doxycycline. She states this feels better. She is using endoform on both of these areas 5/11 TELEHEALTH; the patient was seen for telehealth visit today. She was accompanied at home by her husband. She has severe pulmonary hypertension accompanied scleroderma and in the face of the Covid epidemic cannot be safely  brought into our clinic. We have been using endoform on her wound areas. There are essentially 3 wound areas now in the left mid tibia now 2 open areas that it one point were connected and one on the right lateral ankle just above the malleolus. 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 She cannot come to clinic. The wounds were reviewed today via telehealth. She has severe chronic venous hypertension which I think is centrally mediated. She has wounds on her right anterior tibia and right lateral ankle area. These are chronic. She has been using endoform. 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 We have been following her from telehealth. She has severe chronic venous hypertension which may be mostly centrally mediated secondary to right heart heart failure. She has wounds on her right anterior tibia and right lateral ankle these are chronic we have been using endoform 6/22; TELEHEALTH; this patient was seen today via telehealth. 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 Since the last time we had contact with this patient she developed some pain and erythema around the wound on her right lateral malleolus/ankle and we put in antibiotics for her. This is resulted in good improvement with resolution of the erythema and tenderness. I changed her to silver alginate last time. We had been using endoform for an extended period of time 7/13; TELEHEALTH; this patient was seen today via telehealth. 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 She would continue to be at  a prohibitive risk to be brought into our clinic unless this was absolutely necessary. These visits have been done with her approval as well as her husband. We have been using silver alginate to the areas on the right mid tibia and right lateral lower leg. 7/27 TELEHEALTH; patient was seen along with her husband today via telehealth. 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 We changed her to sample last visit. She has 2 areas a chronic wound on her right mid tibia and one just above her ankle. These were not the original wounds when she came into this clinic but she developed them during treatment 8/17; she comes in for her first face-to-face visit in a long period. She has a remaining area just medial to the right tibia which is the last open part of her large wound across this area. She  also has an area on the right lateral lower leg. We prescribed Santyl last telehealth visit but they were concerned that this was making a deeper so they put silver alginate on it last week. Her husband changes the dressings. 8/31; using Santyl to the 2 wound areas some improvement in wound surfaces. Husband has surgery in 2 weeks we will put her out 3 weeks. Any of the advanced treatment options that I can think of that would be eligible for this wound would also cause her to have to come in weekly. The risk that the patient is just too high 9/21. Using Santyl to the 2 wound areas. Both of these are somewhat better although the medial mid tibia area still has exposed muscle. Lateral ankle requiring debridement. Using Santyl 10/12; using Santyl to the 2 wound areas. One on the right lateral ankle and the other in the medial calf which still has exposed muscle. Both areas have come down in size and have better looking surfaces. She has made nice progress with santyl Objective Constitutional Sitting or standing Blood  Pressure is within target range for patient.. Pulse regular and within target range for patient.Marland Kitchen Respirations regular, non-labored and within target range.. Temperature is normal and within the target range for the patient.Marland Kitchen Appears in no distress. Vitals Time Taken: 9:02 AM, Height: 68 in, Weight: 132 lbs, BMI: 20.1, Temperature: 98.4 F, Pulse: 75 bpm, Respiratory Rate: 18 breaths/min, Blood Pressure: 124/65 mmHg. General Notes: Wound exam; right mid calf the lateral part of this has closed. Medial point is smaller still surface debris and still exposed muscle. Lightly debrided with a #3 curette. ooRight lateral lower ankle also requires debridement although there is a healthy looking surface on this Integumentary (Hair, Skin) Wound #14 status is Open. Original cause of wound was Gradually Appeared. The wound is located on the Right,Medial,Anterior Lower Leg. The wound measures 0.3cm length x 0.3cm width x 0.3cm depth; 0.071cm^2 area and 0.021cm^3 volume. There is Fat Layer (Subcutaneous Tissue) Exposed exposed. There is no tunneling or undermining noted. There is a medium amount of serous drainage noted. The wound margin is distinct with the outline attached to the wound base. There is small (1-33%) pink, pale granulation within the wound bed. There is a large (67-100%) amount of necrotic tissue within the wound bed including Adherent Slough. Wound #5 status is Open. Original cause of wound was Gradually Appeared. The wound is located on the Right,Medial Lower Leg. The wound measures 1.3cm length x 1.2cm width x 0.6cm depth; 1.225cm^2 area and 0.735cm^3 volume. There is muscle and Fat Layer (Subcutaneous Tissue) Exposed exposed. There is no tunneling or undermining noted. There is a medium amount of serous drainage noted. The wound margin is distinct with the outline attached to the wound base. There is small (1-33%) red, pink granulation within the wound bed. There is a large (67-100%)  amount of necrotic tissue within the wound bed including Adherent Slough and Necrosis of Muscle. Wound #7 status is Open. Original cause of wound was Gradually Appeared. The wound is located on the Right,Lateral Malleolus. The wound measures 0.7cm length x 0.5cm width x 0.3cm depth; 0.275cm^2 area and 0.082cm^3 volume. There is Fat Layer (Subcutaneous Tissue) Exposed exposed. There is no tunneling or undermining noted. There is a medium amount of serosanguineous drainage noted. The wound margin is distinct with the outline attached to the wound base. There is no granulation within the wound bed. There is a large (67- 100%) amount of necrotic tissue within  the wound bed including Adherent Slough. 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 There was a Excisional Skin/Subcutaneous Tissue Debridement with a total area of 1.56 sq cm performed by Ricard Dillon., MD. With the following instrument(s): Curette to remove Viable and Non-Viable tissue/material. Material removed includes Subcutaneous Tissue and Slough and after achieving pain control using Lidocaine 5% topical ointment. No specimens were taken. A time out was conducted at 09:22, prior to the start of the procedure. A Minimum amount of bleeding was controlled with N/A. The procedure was tolerated well with a pain level of 0 throughout and a pain level of 0 following the procedure. Post Debridement Measurements: 1.3cm length x 1.2cm width x 0.1cm depth;  0.123cm^3 volume. Character of Wound/Ulcer Post Debridement is improved. Severity of Tissue Post Debridement is: Fat layer exposed. Post procedure Diagnosis Wound #5: Same as Pre-Procedure Wound #7 Pre-procedure diagnosis of Wound #7 is a Venous Leg Ulcer located on the Right,Lateral Malleolus .Severity of Tissue Pre Debridement is: Fat layer exposed. There was a Excisional Skin/Subcutaneous Tissue Debridement with a total area of 0.35 sq cm performed by Ricard Dillon., MD. With the following instrument(s): Curette to remove Viable and Non-Viable tissue/material. Material removed includes Subcutaneous Tissue and Slough and after achieving pain control using Lidocaine 5% topical ointment. No specimens were taken. A time out was conducted at 09:21, prior to the start of the procedure. A Minimum amount of bleeding was controlled with N/A. The procedure was tolerated well with a pain level of 0 throughout and a pain level of 0 following the procedure. Post Debridement Measurements: 0.7cm length x 0.5cm width x 0.1cm depth; 0.027cm^3 volume. Character of Wound/Ulcer Post Debridement is improved. Severity of Tissue Post Debridement is: Fat layer exposed. Post procedure Diagnosis Wound #7: Same as Pre-Procedure Plan Follow-up Appointments: Return appointment in 3 weeks. Dressing Change Frequency: Change dressing every day. - all wounds Skin Barriers/Peri-Wound Care: Moisturizing lotion - both legs Wound Cleansing: May shower and wash wound with soap and water. Primary Wound Dressing: Wound #14 Right,Medial,Anterior Lower Leg: Santyl Ointment Wound #5 Right,Medial Lower Leg: Santyl Ointment Wound #7 Right,Lateral Malleolus: Santyl Ointment Secondary Dressing: Wound #14 Right,Medial,Anterior Lower Leg: Dry Gauze Wound #5 Right,Medial Lower Leg: Dry Gauze Wound #7 Right,Lateral Malleolus: Dry Gauze Edema Control: Kerlix and Coban - Right Lower Extremity Avoid standing for long  periods of time Elevate legs to the level of the heart or above for 30 minutes daily and/or when sitting, a frequency of: - throughout the day 1. Continue with Santyl to both areas. Had some thoughts about switching to collagen or endoform however on that he give the Santyl another few weeks. Much better surface easier to debride Electronic Signature(s) Signed: 10/15/2019 5:35:18 PM By: Linton Ham MD Signed: 10/16/2019 6:00:55 PM By: Levan Hurst RN, BSN Previous Signature: 10/12/2019 5:50:55 PM Version By: Linton Ham MD Entered By: Levan Hurst on 10/14/2019 10:45:48 -------------------------------------------------------------------------------- SuperBill Details Patient Name: Date of Service: [name] [name] 10/12/2019 Medical Record Number:[id number] Patient Account Number: [id number] Date of Birth/Sex: Treating RN: 09/30/1951 (68  y.o. 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

## 2019-10-16 ENCOUNTER — Other Ambulatory Visit: Payer: Medicare Other | Admitting: Internal Medicine

## 2019-10-16 ENCOUNTER — Other Ambulatory Visit: Payer: Self-pay | Admitting: Orthopedic Surgery

## 2019-10-16 DIAGNOSIS — I96 Gangrene, not elsewhere classified: Secondary | ICD-10-CM | POA: Diagnosis not present

## 2019-10-16 DIAGNOSIS — M349 Systemic sclerosis, unspecified: Secondary | ICD-10-CM | POA: Diagnosis not present

## 2019-10-19 ENCOUNTER — Other Ambulatory Visit (HOSPITAL_COMMUNITY)
Admission: RE | Admit: 2019-10-19 | Discharge: 2019-10-19 | Disposition: A | Discharge status: Home / Self Care | Payer: Medicare Other | Source: Ambulatory Visit | Attending: Orthopedic Surgery | Admitting: Orthopedic Surgery

## 2019-10-19 DIAGNOSIS — Z20828 Contact with and (suspected) exposure to other viral communicable diseases: Secondary | ICD-10-CM | POA: Insufficient documentation

## 2019-10-19 DIAGNOSIS — Z01812 Encounter for preprocedural laboratory examination: Secondary | ICD-10-CM | POA: Diagnosis not present

## 2019-10-20 ENCOUNTER — Other Ambulatory Visit: Payer: Self-pay

## 2019-10-20 ENCOUNTER — Other Ambulatory Visit: Payer: Medicare Other | Admitting: Internal Medicine

## 2019-10-20 ENCOUNTER — Encounter (HOSPITAL_COMMUNITY): Payer: Self-pay

## 2019-10-20 LAB — NOVEL CORONAVIRUS, NAA (HOSP ORDER, SEND-OUT TO REF LAB; TAT 18-24 HRS): SARS-CoV-2, NAA: NOT DETECTED

## 2019-10-20 NOTE — Telephone Encounter (Signed)
error 

## 2019-10-22 ENCOUNTER — Other Ambulatory Visit: Payer: Medicare Other | Admitting: Internal Medicine

## 2019-10-22 ENCOUNTER — Other Ambulatory Visit: Payer: Self-pay

## 2019-10-22 ENCOUNTER — Ambulatory Visit (HOSPITAL_COMMUNITY): Payer: Medicare Other | Admitting: Anesthesiology

## 2019-10-22 ENCOUNTER — Encounter (HOSPITAL_COMMUNITY): Payer: Self-pay

## 2019-10-22 ENCOUNTER — Encounter (HOSPITAL_COMMUNITY)
Admission: RE | Disposition: A | Discharge status: Home / Self Care | Payer: Self-pay | Source: Home / Self Care | Attending: Orthopedic Surgery

## 2019-10-22 ENCOUNTER — Ambulatory Visit (HOSPITAL_COMMUNITY)
Admission: RE | Admit: 2019-10-22 | Discharge: 2019-10-22 | Disposition: A | Discharge status: Home / Self Care | Payer: Medicare Other | Attending: Orthopedic Surgery | Admitting: Orthopedic Surgery

## 2019-10-22 DIAGNOSIS — I10 Essential (primary) hypertension: Secondary | ICD-10-CM | POA: Insufficient documentation

## 2019-10-22 DIAGNOSIS — M349 Systemic sclerosis, unspecified: Secondary | ICD-10-CM | POA: Diagnosis not present

## 2019-10-22 DIAGNOSIS — Z85820 Personal history of malignant melanoma of skin: Secondary | ICD-10-CM | POA: Insufficient documentation

## 2019-10-22 DIAGNOSIS — M329 Systemic lupus erythematosus, unspecified: Secondary | ICD-10-CM | POA: Diagnosis not present

## 2019-10-22 DIAGNOSIS — F329 Major depressive disorder, single episode, unspecified: Secondary | ICD-10-CM | POA: Diagnosis not present

## 2019-10-22 DIAGNOSIS — Z8572 Personal history of non-Hodgkin lymphomas: Secondary | ICD-10-CM | POA: Insufficient documentation

## 2019-10-22 DIAGNOSIS — M86141 Other acute osteomyelitis, right hand: Secondary | ICD-10-CM | POA: Diagnosis not present

## 2019-10-22 DIAGNOSIS — I96 Gangrene, not elsewhere classified: Secondary | ICD-10-CM | POA: Diagnosis not present

## 2019-10-22 DIAGNOSIS — I129 Hypertensive chronic kidney disease with stage 1 through stage 4 chronic kidney disease, or unspecified chronic kidney disease: Secondary | ICD-10-CM | POA: Diagnosis not present

## 2019-10-22 DIAGNOSIS — Z79899 Other long term (current) drug therapy: Secondary | ICD-10-CM | POA: Insufficient documentation

## 2019-10-22 DIAGNOSIS — E039 Hypothyroidism, unspecified: Secondary | ICD-10-CM | POA: Diagnosis not present

## 2019-10-22 DIAGNOSIS — Z7901 Long term (current) use of anticoagulants: Secondary | ICD-10-CM | POA: Diagnosis not present

## 2019-10-22 DIAGNOSIS — B49 Unspecified mycosis: Secondary | ICD-10-CM | POA: Diagnosis not present

## 2019-10-22 DIAGNOSIS — I2721 Secondary pulmonary arterial hypertension: Secondary | ICD-10-CM | POA: Insufficient documentation

## 2019-10-22 DIAGNOSIS — M1990 Unspecified osteoarthritis, unspecified site: Secondary | ICD-10-CM | POA: Diagnosis not present

## 2019-10-22 DIAGNOSIS — N183 Chronic kidney disease, stage 3 unspecified: Secondary | ICD-10-CM | POA: Diagnosis not present

## 2019-10-22 DIAGNOSIS — M879 Osteonecrosis, unspecified: Secondary | ICD-10-CM | POA: Diagnosis not present

## 2019-10-22 DIAGNOSIS — Z79890 Hormone replacement therapy (postmenopausal): Secondary | ICD-10-CM | POA: Insufficient documentation

## 2019-10-22 HISTORY — PX: AMPUTATION: SHX166

## 2019-10-22 LAB — CBC
HCT: 33.5 % — ABNORMAL LOW (ref 36.0–46.0)
Hemoglobin: 10.3 g/dL — ABNORMAL LOW (ref 12.0–15.0)
MCH: 33 pg (ref 26.0–34.0)
MCHC: 30.7 g/dL (ref 30.0–36.0)
MCV: 107.4 fL — ABNORMAL HIGH (ref 80.0–100.0)
Platelets: 306 10*3/uL (ref 150–400)
RBC: 3.12 MIL/uL — ABNORMAL LOW (ref 3.87–5.11)
RDW: 13.1 % (ref 11.5–15.5)
WBC: 5.2 10*3/uL (ref 4.0–10.5)
nRBC: 0 % (ref 0.0–0.2)

## 2019-10-22 LAB — BASIC METABOLIC PANEL
Anion gap: 17 — ABNORMAL HIGH (ref 5–15)
BUN: 41 mg/dL — ABNORMAL HIGH (ref 8–23)
CO2: 20 mmol/L — ABNORMAL LOW (ref 22–32)
Calcium: 7.3 mg/dL — ABNORMAL LOW (ref 8.9–10.3)
Chloride: 98 mmol/L (ref 98–111)
Creatinine, Ser: 1.58 mg/dL — ABNORMAL HIGH (ref 0.44–1.00)
GFR calc Af Amer: 38 mL/min — ABNORMAL LOW (ref >60)
GFR calc non Af Amer: 33 mL/min — ABNORMAL LOW (ref >60)
Glucose, Bld: 86 mg/dL (ref 70–99)
Potassium: 2.9 mmol/L — ABNORMAL LOW (ref 3.5–5.1)
Sodium: 135 mmol/L (ref 135–145)

## 2019-10-22 SURGERY — AMPUTATION DIGIT
Anesthesia: Monitor Anesthesia Care | Site: Hand | Laterality: Right

## 2019-10-22 MED ORDER — EPHEDRINE 5 MG/ML INJ
INTRAVENOUS | Status: AC
  Filled 2019-10-22: qty 20

## 2019-10-22 MED ORDER — ROPIVACAINE HCL 5 MG/ML IJ SOLN
INTRAMUSCULAR | Status: DC | PRN
  Administered 2019-10-22: 30 mL via PERINEURAL

## 2019-10-22 MED ORDER — ONDANSETRON HCL 4 MG/2ML IJ SOLN
INTRAMUSCULAR | Status: AC
  Filled 2019-10-22: qty 2

## 2019-10-22 MED ORDER — MIDAZOLAM HCL 2 MG/2ML IJ SOLN
INTRAMUSCULAR | Status: AC
  Administered 2019-10-22: 13:00:00 2 mg via INTRAVENOUS
  Filled 2019-10-22: qty 2

## 2019-10-22 MED ORDER — HYDROMORPHONE HCL 1 MG/ML IJ SOLN: 0.2500 mg | INTRAMUSCULAR | Status: DC | PRN

## 2019-10-22 MED ORDER — LIDOCAINE HCL (PF) 1 % IJ SOLN
INTRAMUSCULAR | Status: DC | PRN
  Administered 2019-10-22: 30 mL

## 2019-10-22 MED ORDER — FENTANYL CITRATE (PF) 250 MCG/5ML IJ SOLN
INTRAMUSCULAR | Status: AC
  Filled 2019-10-22: qty 5

## 2019-10-22 MED ORDER — MIDAZOLAM HCL 2 MG/2ML IJ SOLN
2.0000 mg | Freq: Once | INTRAMUSCULAR | Status: AC
  Administered 2019-10-22: 13:00:00 2 mg via INTRAVENOUS

## 2019-10-22 MED ORDER — OXYCODONE HCL 5 MG PO TABS: 5.0000 mg | ORAL_TABLET | Freq: Once | ORAL | Status: DC | PRN

## 2019-10-22 MED ORDER — FENTANYL CITRATE (PF) 100 MCG/2ML IJ SOLN
50.0000 ug | Freq: Once | INTRAMUSCULAR | Status: AC
  Administered 2019-10-22: 50 ug via INTRAVENOUS

## 2019-10-22 MED ORDER — ONDANSETRON HCL 4 MG/2ML IJ SOLN
INTRAMUSCULAR | Status: DC | PRN
  Administered 2019-10-22: 4 mg via INTRAVENOUS

## 2019-10-22 MED ORDER — PROPOFOL 10 MG/ML IV BOLUS
INTRAVENOUS | Status: DC | PRN
  Administered 2019-10-22 (×2): 10 mg via INTRAVENOUS

## 2019-10-22 MED ORDER — HYDROCODONE-ACETAMINOPHEN 5-325 MG PO TABS: 1.0000 | ORAL_TABLET | Freq: Four times a day (QID) | ORAL | 0 refills | Status: DC | PRN

## 2019-10-22 MED ORDER — VANCOMYCIN HCL IN DEXTROSE 1-5 GM/200ML-% IV SOLN
1000.0000 mg | INTRAVENOUS | Status: AC
  Administered 2019-10-22: 11:00:00 1000 mg via INTRAVENOUS
  Filled 2019-10-22: qty 200

## 2019-10-22 MED ORDER — LACTATED RINGERS IV SOLN
INTRAVENOUS | Status: DC
  Administered 2019-10-22: 11:00:00 via INTRAVENOUS

## 2019-10-22 MED ORDER — 0.9 % SODIUM CHLORIDE (POUR BTL) OPTIME
TOPICAL | Status: DC | PRN
  Administered 2019-10-22: 1000 mL

## 2019-10-22 MED ORDER — CHLORHEXIDINE GLUCONATE 4 % EX LIQD: 60.0000 mL | Freq: Once | CUTANEOUS | Status: DC

## 2019-10-22 MED ORDER — FENTANYL CITRATE (PF) 100 MCG/2ML IJ SOLN
50.0000 ug | Freq: Once | INTRAMUSCULAR | Status: AC
  Administered 2019-10-22: 12:00:00 50 ug via INTRAVENOUS
  Filled 2019-10-22: qty 2

## 2019-10-22 MED ORDER — OXYCODONE HCL 5 MG/5ML PO SOLN: 5.0000 mg | Freq: Once | ORAL | Status: DC | PRN

## 2019-10-22 MED ORDER — DEXAMETHASONE SODIUM PHOSPHATE 10 MG/ML IJ SOLN
INTRAMUSCULAR | Status: AC
  Filled 2019-10-22: qty 1

## 2019-10-22 MED ORDER — LIDOCAINE HCL (PF) 1 % IJ SOLN
INTRAMUSCULAR | Status: AC
  Filled 2019-10-22: qty 30

## 2019-10-22 MED ORDER — PROPOFOL 500 MG/50ML IV EMUL
INTRAVENOUS | Status: DC | PRN
  Administered 2019-10-22: 50 ug/kg/min via INTRAVENOUS

## 2019-10-22 MED ORDER — EPHEDRINE SULFATE 50 MG/ML IJ SOLN
INTRAMUSCULAR | Status: DC | PRN
  Administered 2019-10-22: 5 mg via INTRAVENOUS

## 2019-10-22 MED ORDER — PHENYLEPHRINE 40 MCG/ML (10ML) SYRINGE FOR IV PUSH (FOR BLOOD PRESSURE SUPPORT)
PREFILLED_SYRINGE | INTRAVENOUS | Status: AC
  Filled 2019-10-22: qty 10

## 2019-10-22 SURGICAL SUPPLY — 47 items
BLADE SURG 15 STRL LF DISP TIS (BLADE) ×1 IMPLANT
BLADE SURG 15 STRL SS (BLADE) ×1
BNDG COHESIVE 1X5 TAN STRL LF (GAUZE/BANDAGES/DRESSINGS) ×2 IMPLANT
BNDG COHESIVE 3X5 TAN STRL LF (GAUZE/BANDAGES/DRESSINGS) ×2 IMPLANT
BNDG ESMARK 4X9 LF (GAUZE/BANDAGES/DRESSINGS) ×2 IMPLANT
BNDG GAUZE ELAST 4 BULKY (GAUZE/BANDAGES/DRESSINGS) ×2 IMPLANT
CORD BIPOLAR FORCEPS 12FT (ELECTRODE) ×2 IMPLANT
COVER WAND RF STERILE (DRAPES) ×2 IMPLANT
CUFF TOURN SGL QUICK 18X4 (TOURNIQUET CUFF) ×2 IMPLANT
CUFF TOURN SGL QUICK 24 (TOURNIQUET CUFF)
CUFF TRNQT CYL 24X4X16.5-23 (TOURNIQUET CUFF) IMPLANT
DRSG KUZMA FLUFF (GAUZE/BANDAGES/DRESSINGS) IMPLANT
DRSG XEROFORM 1X8 (GAUZE/BANDAGES/DRESSINGS) ×2 IMPLANT
DURAPREP 26ML APPLICATOR (WOUND CARE) ×2 IMPLANT
GAUZE SPONGE 4X4 12PLY STRL (GAUZE/BANDAGES/DRESSINGS) IMPLANT
GAUZE SPONGE 4X4 12PLY STRL LF (GAUZE/BANDAGES/DRESSINGS) ×2 IMPLANT
GAUZE XEROFORM 1X8 LF (GAUZE/BANDAGES/DRESSINGS) ×2 IMPLANT
GLOVE BIO SURGEON STRL SZ 6.5 (GLOVE) ×2 IMPLANT
GLOVE BIOGEL PI IND STRL 8.5 (GLOVE) ×1 IMPLANT
GLOVE BIOGEL PI INDICATOR 8.5 (GLOVE) ×1
GLOVE SURG ORTHO 8.0 STRL STRW (GLOVE) ×2 IMPLANT
GOWN STRL REUS W/ TWL LRG LVL3 (GOWN DISPOSABLE) ×2 IMPLANT
GOWN STRL REUS W/ TWL XL LVL3 (GOWN DISPOSABLE) ×1 IMPLANT
GOWN STRL REUS W/TWL LRG LVL3 (GOWN DISPOSABLE) ×2
GOWN STRL REUS W/TWL XL LVL3 (GOWN DISPOSABLE) ×1
KIT BASIN OR (CUSTOM PROCEDURE TRAY) ×2 IMPLANT
KIT TURNOVER KIT B (KITS) ×2 IMPLANT
MANIFOLD NEPTUNE II (INSTRUMENTS) ×2 IMPLANT
NEEDLE HYPO 25GX1X1/2 BEV (NEEDLE) IMPLANT
NS IRRIG 1000ML POUR BTL (IV SOLUTION) ×2 IMPLANT
PACK ORTHO EXTREMITY (CUSTOM PROCEDURE TRAY) ×2 IMPLANT
PAD ARMBOARD 7.5X6 YLW CONV (MISCELLANEOUS) ×4 IMPLANT
PAD CAST 4YDX4 CTTN HI CHSV (CAST SUPPLIES) IMPLANT
PADDING CAST COTTON 4X4 STRL (CAST SUPPLIES)
RUBBERBAND STERILE (MISCELLANEOUS) ×2 IMPLANT
SPECIMEN JAR SMALL (MISCELLANEOUS) ×2 IMPLANT
SUT ETHILON 4 0 PS 2 18 (SUTURE) ×8 IMPLANT
SUT ETHILON 5 0 PS 2 18 (SUTURE) IMPLANT
SUT SILK 4 0 PS 2 (SUTURE) IMPLANT
SUT VICRYL 4-0 PS2 18IN ABS (SUTURE) IMPLANT
SWAB COLLECTION DEVICE MRSA (MISCELLANEOUS) ×2 IMPLANT
SWAB CULTURE LIQUID MINI MALE (MISCELLANEOUS) ×2 IMPLANT
SYR CONTROL 10ML LL (SYRINGE) IMPLANT
TOWEL GREEN STERILE (TOWEL DISPOSABLE) ×2 IMPLANT
TOWEL GREEN STERILE FF (TOWEL DISPOSABLE) ×2 IMPLANT
UNDERPAD 30X30 (UNDERPADS AND DIAPERS) ×2 IMPLANT
WATER STERILE IRR 1000ML POUR (IV SOLUTION) ×2 IMPLANT

## 2019-10-22 NOTE — Transfer of Care (Signed)
Immediate Anesthesia Transfer of Care Note  Patient: [name] [name]  Procedure(s) Performed: AMPUTATION DIGIT RIGHT RING AND SMALL FINGER, DIGITAL SYMPATHECTOMY RIGHT RADIAL ULNAR ARCH (Right Hand)  Patient Location: PACU  Anesthesia Type:MAC  Level of Consciousness: awake, alert  and oriented  Airway & Oxygen Therapy: Patient Spontanous Breathing and Patient connected to face mask oxygen  Post-op Assessment: Report given to RN and Post -op Vital signs reviewed and stable  Post vital signs: Reviewed and stable  Last Vitals:  Vitals Value Taken Time  BP 141/53 10/22/19 1659  Temp    Pulse 70 10/22/19 1659  Resp 18 10/22/19 1659  SpO2 93 % 10/22/19 1659  Vitals shown include unvalidated device data.  Last Pain:  Vitals:   10/22/19 1350  TempSrc:   PainSc: Asleep      Patients Stated Pain Goal: 2 (80/06/34 9494)  Complications: No apparent anesthesia complications

## 2019-10-22 NOTE — Anesthesia Preprocedure Evaluation (Signed)
Anesthesia Evaluation  Patient identified by MRN, date of birth, ID band Patient awake    Reviewed: Allergy & Precautions, NPO status   Airway Mallampati: II  TM Distance: >3 FB Neck ROM: Full    Dental   Pulmonary shortness of breath,    breath sounds clear to auscultation       Cardiovascular hypertension, + Peripheral Vascular Disease and + DOE   Rhythm:Regular Rate:Normal     Neuro/Psych Depression    GI/Hepatic Neg liver ROS, GI history noted. CE   Endo/Other  negative endocrine ROS  Renal/GU negative Renal ROS     Musculoskeletal   Abdominal   Peds  Hematology  (+) Blood dyscrasia, anemia ,   Anesthesia Other Findings   Reproductive/Obstetrics                             Anesthesia Physical  Anesthesia Plan  ASA: III  Anesthesia Plan: MAC   Post-op Pain Management:  Regional for Post-op pain   Induction: Intravenous  PONV Risk Score and Plan: 2 and Ondansetron, Midazolam and Treatment may vary due to age or medical condition  Airway Management Planned: Simple Face Mask  Additional Equipment:   Intra-op Plan:   Post-operative Plan:   Informed Consent: I have reviewed the patients History and Physical, chart, labs and discussed the procedure including the risks, benefits and alternatives for the proposed anesthesia with the patient or authorized representative who has indicated his/her understanding and acceptance.     Dental advisory given  Plan Discussed with: CRNA and Anesthesiologist  Anesthesia Plan Comments:         Anesthesia Quick Evaluation

## 2019-10-22 NOTE — Progress Notes (Signed)
Remaining 69mg of fentanyl given per Dr. MSabra Heckfor peripheral nerve block. See MAR. PMaryjean Ka RN witnessed.

## 2019-10-22 NOTE — Op Note (Signed)
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  Periarterial sympathectomy radial artery  4 periarterial sympathectomy ulnar artery  5 periarterial sympathectomy arch   SURGEON: Daryll Brod, M.D.   ASSISTANT: Leanora Cover, MD   ANESTHESIA:  Regional with sedation   INTRAVENOUS FLUIDS:  Per anesthesia flow sheet.   ESTIMATED BLOOD LOSS:  Minimal.   COMPLICATIONS:  None.   SPECIMENS:   Ring and small fingers   TOURNIQUET TIME:    Total Tourniquet Time Documented: Upper Arm (Right) - 73 minutes Total: Upper Arm (Right) - 73 minutes    DISPOSITION:  Stable to PACU.   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  Bone is exposed at the proximal phalanx of the small finger with gangrene distally.  Bone is exposed at the distal aspect of the middle phalanx of ring finger.  She has had conservative treatment along with Botox without relief she is admitted now for revision amputations of the ring and small fingers with periarterial sympathectomies radial and ulnar arteries and arch right hand he is aware that there is no guarantee to the surgery the possibility of infection recurrence injury to arteries nerves tendons complete relief symptoms dystrophy.  The sympathectomies are done in an effort to improve the possibility of healing especially to the remaining ring finger.  In the preoperative area the patient is seen the extremity marked by both patient and surgeon  antibiotic given.  A supraclavicular block was carried out without difficulty under the direction the anesthesia department. OPERATIVE COURSE: Patient is brought to the operating room placed in a supine position with the right arm free.  She was prepped and draped using ChloraPrep and a 3-minute dry time allowed.  A timeout was taken confirming patient procedure.  The fingers were attended to first.  These ring finger was incised so as to allow creation of a flap for closure through the middle phalanx.  Significant calcification of the skin was present about the entire area the midportion of the middle phalanx was then osteotomized using a bone cutter rongeurs were then used to shorten this neurovascular structures were significantly clotted.  The calcified subcutaneous tissue was debrided as much as possible to allow the closure to be performed wound was copiously irrigated with saline after tendons and extensor tendons were cut allowed to retract.  The skin was closed with interrupted 4-0 nylon sutures.  The small finger was attended to next a disarticulation through the metacarpal phalangeal joint with plan.  Curvilinear incisions about the base of the proximal phalanx were made carried down through subcutaneous tissue again significant calcification of the skin was present along with location about the arterial and venous system.  Bleeders were electrocauterized with bipolar as necessary.  The digit was disarticulated at the metacarpal phalangeal joint.  Wound was irrigated cultures were taken of the amputated specimen.  Sent for both aerobic and anaerobic cultures the skin was then closed  with interrupted 4-0 nylon sutures.  The incisions were then made over the radial artery on the radial aspect of the forearm the ulnar artery on the ulnar aspect and a across the arch.  Carried down through subcutaneous tissue identifying the radial artery ulnar artery and superficial arch.  The operative microscope was  then brought into the field.  This was then used to do a periarterial sympathectomy of the radial artery.  This was done over approximately a centimeter and 1/2 to 2 cm.  The ulnar artery was attended to next the operative microscope was readjusted.  This allowed visualization of the ulnar artery and vena comitans.  A periarterial sympathectomy was then performed over similar distance as to the radial artery.  The arch was then isolated after transecting the palmar fascia this allowed visualization of the arch especially tending to the common digital arteries to the ring finger a sympathectomy was then performed to the arch.  The wounds were copiously irrigated with saline.  Skin was closed on each of the wounds with interrupted 4-0 nylon sutures.  A sterile compressive dressing was applied.  The patient tolerated the procedure well and was taken to the recovery room for observation in satisfactory condition.  Be discharged home to return to [facility] [place] in 1 week on [place].   Daryll Brod, MD Electronically signed, 10/22/19

## 2019-10-22 NOTE — Brief Op Note (Signed)
210/22/2020  4:35 PM  PATIENT:  [name] [name]  68 y.o. female  PRE-OPERATIVE DIAGNOSIS:  GANGRENE RIGHT RING AND SMALL FINGERS  POST-OPERATIVE DIAGNOSIS:  GANGRENE RIGHT RING AND SMALL FINGERS  PROCEDURE:  Procedure(s) with comments: AMPUTATION DIGIT RIGHT RING AND SMALL FINGER, DIGITAL SYMPATHECTOMY RIGHT RADIAL ULNAR ARCH (Right) - AXILARY  SURGEON:  Surgeon(s) and Role:    * Daryll Brod, MD - Primary    * Leanora Cover, MD - Assisting  PHYSICIAN ASSISTANT:   ASSISTANTS: K Alden Feagan,MD   ANESTHESIA:   regional and IV sedation  EBL: 63m BLOOD ADMINISTERED:none  DRAINS: none   LOCAL MEDICATIONS USED:  NONE  SPECIMEN:  Excision  DISPOSITION OF SPECIMEN:  PATHOLOGY  COUNTS:  YES  TOURNIQUET:   Total Tourniquet Time Documented: Upper Arm (Right) - 73 minutes Total: Upper Arm (Right) - 73 minutes   DICTATION: .DViviann SpareDictation  PLAN OF CARE: Discharge to home after PACU  PATIENT DISPOSITION:  PACU - hemodynamically stable.

## 2019-10-22 NOTE — H&P (Signed)
[name] [name] is an 68 y.o. female.   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 She has undergone amputations in the past has recently seen Dr. Gershon Mussel for 2 Botox injections 1 to the right 1 to the left. She does not feel that they have afforded any significant improvement for her. She is showing increased discomfort on her right side as the weather worsens. She has  not had sympathectomies done. She has had increasing pain. She would like to proceed to have the fingers amputated. This is ring and small finger right hand.   Past Medical History:  Diagnosis Date  . Anemia   . Arthritis   . DOE (dyspnea on exertion)   . Epistaxis   . History of chicken pox   . Hypertension   . Hypothyroidism   . Leg ulcer ([place])   . Melanoma ([place]) 1999  . 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 Non Hodgkin's lymphoma ([place])    Treated with chemo 2010, felt to be cured.  Marland Kitchen PAH (pulmonary artery hypertension) ([place])   . Peripheral arterial occlusive disease ([place])   . Pulmonary hypertension ([place])   . Requires supplemental oxygen    2 liters-3liters when moving  . 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 AMPUTATION Right 10/24/2017   Procedure: RAY resection right index;  Surgeon: Daryll Brod, MD;  Location: [place];  Service: Orthopedics;  Laterality: Right;  Axillary block  . AMPUTATION Right 11/21/2018   Procedure: AMPUTATION RAY RIGHT MIDDLE FINGER;  Surgeon: Daryll Brod, MD;  Location: [place];  Service: Orthopedics;  Laterality: Right;  . apligraph  12-04-2102,   12-18-2012   bilateral calves   [name]  . biopsy on cerebellum    . COLONOSCOPY WITH PROPOFOL Left 10/07/2015   Procedure: COLONOSCOPY WITH PROPOFOL;  Surgeon: [name], MD;  Location: [place];  Service: Endoscopy;  Laterality: Left;  . ENDARTERECTOMY FEMORAL Right 09/14/2016   Procedure: ENDARTERECTOMY FEMORAL RIGHT;  Surgeon: [name], MD;  Location: [facility] OR;  Service: Vascular;  Laterality: Right;  . ENDOVENOUS ABLATION SAPHENOUS VEIN W/ LASER  07-24-2012   right greater saphenous vein by [name], M.D.   . ENDOVENOUS ABLATION SAPHENOUS VEIN W/ LASER  10-16-2012   left greater saphenous vein  by Dr. [name]  . ENDOVENOUS ABLATION SAPHENOUS VEIN W/ LASER Right 09-01-2015   endovenous laser ablation right greater saphenous vein by [name] MD  . ESOPHAGOGASTRODUODENOSCOPY N/A 05/07/2016   Procedure: ESOPHAGOGASTRODUODENOSCOPY (EGD);  Surgeon: [name], MD;  Location: [facility] ENDOSCOPY;  Service: Endoscopy;  Laterality: N/A;  . ESOPHAGOGASTRODUODENOSCOPY (EGD) WITH PROPOFOL Left 10/07/2015   Procedure: ESOPHAGOGASTRODUODENOSCOPY (EGD) WITH PROPOFOL;  Surgeon: [name], MD;  Location: [place];  Service: Endoscopy;  Laterality: Left;  . GIVENS CAPSULE STUDY N/A 07/31/2016   Procedure: GIVENS CAPSULE STUDY;  Surgeon: [name], MD;  Location: [facility] ENDOSCOPY;  Service: Endoscopy;  Laterality: N/A;  . melonoma removes Right    behind right knee  . PATCH ANGIOPLASTY Right 09/14/2016   Procedure: PATCH ANGIOPLASTY RIGHT FEMORAL ARTERY USING HEMASHIELD PLATINUM FINESSE PATCH;  Surgeon: [name], MD;  Location: [place];  Service: Vascular;  Laterality: Right;  . PERIPHERAL VASCULAR CATHETERIZATION N/A 09/07/2016   Procedure: Abdominal Aortogram w/Lower Extremity;  Surgeon: [name], MD;  Location: [place] CV LAB;  Service:  Cardiovascular;  Laterality: N/A;  . RIGHT HEART CATH N/A 04/19/2017   Procedure: Right Heart Cath;  Surgeon: [name], MD;  Location: [place] CV LAB;  Service: Cardiovascular;  Laterality: N/A;  . RIGHT HEART CATHETERIZATION N/A 11/14/2012   Procedure: RIGHT HEART CATH;  Surgeon: [name], MD;  Location: [facility] CATH LAB;  Service: Cardiovascular;  Laterality: N/A;  . rt little toe removal  10/1998  . stent left thigh   3/11  . TENDON TRANSFER Right 10/24/2017   Procedure: transfer extensor indecus propius/extensor digitorum commomus index to middle ring and small;  Surgeon: Daryll Brod, MD;  Location: [place];  Service: Orthopedics;  Laterality: Right;  . TONSILLECTOMY    . TONSILLECTOMY AND ADENOIDECTOMY    . TUNNELED VENOUS CATHETER PLACEMENT  01/2013  . ULNAR HEAD EXCISION Right 10/24/2017   Procedure: darrach right ulna;  Surgeon: Daryll Brod, MD;  Location: [place];  Service: Orthopedics;  Laterality: Right;  . VEIN LIGATION AND STRIPPING  05/16/2012   Procedure: VEIN LIGATION AND STRIPPING;  Surgeon: [name], MD;  Location: [facility] OR;  Service: Vascular;  Laterality: Right;  Irrigation and Debridement right lower leg, ligation of saphenous vein.    Family History  Problem Relation Age of Onset  . Lupus Father   . Lymphoma Father   . Hyperlipidemia Mother   . Cancer Daughter        sarcoma  . Colon cancer Other    Social History:  reports that she has never smoked. She has never used smokeless tobacco. She reports current alcohol use. She reports that she does not use drugs.  Allergies:  Allergies  Allergen Reactions  . Levofloxacin Other (See Comments)    FLU LIKE SYMPTOM Other reaction(s): Other (See Comments) FLU LIKE SYMPTOM  . Sulfa Antibiotics Rash and Other (See Comments)    Face peeled Severe rash with significant peeling of face. No airway involvement per patient Face peeled  . Levofloxacin In D5w Other (See Comments)    FLU LIKE SYMPTOMS FLU LIKE SYMPTOMS  . Doxycycline Hyclate Swelling    SWELLING REACTION UNSPECIFIED  "TABLETS ONLY - CAPSULES ARE TOLERATED FINE"  . Penicillins Rash    Has patient had a PCN reaction causing immediate rash, facial/tongue/throat swelling, SOB or lightheadedness with hypotension: no Has patient had a PCN reaction causing severe rash involving mucus membranes or skin necrosis: no Has patient had a PCN reaction that required hospitalization: no Has  patient had a PCN reaction occurring within the last 10 years: {no If all of the above answers are "NO", then may proceed with Cephalosporin use.    No medications prior to admission.    No results found for this or any previous visit (from the past 48 hour(s)).  No results found.   Pertinent items are noted in HPI.  Height _0  (1.727 m), weight 57.2 kg.  General appearance: alert, cooperative and appears stated age Head: Normocephalic, without obvious abnormality Neck: no JVD Resp: clear to auscultation bilaterally Cardio: regular rate and rhythm, S1, S2 normal, no murmur, click, rub or gallop GI: soft, non-tender; bowel sounds normal; no masses,  no organomegaly Extremities: gangrene right ring and small fingers Pulses: 2+ and symmetric Skin: Skin color, texture, turgor normal. No rashes or lesions Neurologic: Grossly normal Incision/Wound: open  Assessment/Plan  Assessment:  1. Scleroderma ([place]) M34.9  2. Dry gangrene ([place])    Plan: We will proceed with digital sympathectomies including radial and ulnar arteries arch with  amputation of the small finger at the metacarpal phalangeal joint and amputation of the ring finger attempting to maintain as much length as possible. They are aware of risks and complications including infection further loss of fingers. This will be scheduled as an outpatient under regional anesthesia.   Daryll Brod 10/22/2019, 6:35 AM

## 2019-10-22 NOTE — Anesthesia Procedure Notes (Signed)
Anesthesia Regional Block: Supraclavicular block   Pre-Anesthetic Checklist: ,, timeout performed, Correct Patient, Correct Site, Correct Laterality, Correct Procedure, Correct Position, site marked, Risks and benefits discussed,  Surgical consent,  Pre-op evaluation,  At surgeon's request and post-op pain management  Laterality: Right  Prep: chloraprep       Needles:  Injection technique: Single-shot  Needle Type: Stimiplex     Needle Length: 9cm  Needle Gauge: 21     Additional Needles:   Procedures:,,,, ultrasound used (permanent image in chart),,,,  Narrative:  Start time: 10/22/2019 1:30 PM End time: 10/22/2019 1:35 PM Injection made incrementally with aspirations every 5 mL.  Performed by: Personally  Anesthesiologist: Lynda Rainwater, MD

## 2019-10-22 NOTE — Discharge Instructions (Signed)

## 2019-10-22 NOTE — Anesthesia Procedure Notes (Signed)
Procedure Name: MAC Date/Time: 10/22/2019 3:00 PM Performed by: Inda Coke, CRNA Pre-anesthesia Checklist: Patient identified, Emergency Drugs available, Suction available, Timeout performed and Patient being monitored Patient Re-evaluated:Patient Re-evaluated prior to induction Oxygen Delivery Method: Simple face mask Induction Type: IV induction Dental Injury: Teeth and Oropharynx as per pre-operative assessment

## 2019-10-23 ENCOUNTER — Encounter (HOSPITAL_COMMUNITY): Payer: Self-pay | Admitting: Orthopedic Surgery

## 2019-10-23 NOTE — Anesthesia Postprocedure Evaluation (Signed)
Anesthesia Post Note  Patient: [name] [name]  Procedure(s) Performed: AMPUTATION DIGIT RIGHT RING AND SMALL FINGER, DIGITAL SYMPATHECTOMY RIGHT RADIAL ULNAR ARCH (Right Hand)     Patient location during evaluation: PACU Anesthesia Type: MAC and Regional Level of consciousness: awake and alert Pain management: pain level controlled Vital Signs Assessment: post-procedure vital signs reviewed and stable Respiratory status: spontaneous breathing, nonlabored ventilation, respiratory function stable and patient connected to nasal cannula oxygen Cardiovascular status: stable and blood pressure returned to baseline Postop Assessment: no apparent nausea or vomiting Anesthetic complications: no    Last Vitals:  Vitals:   10/22/19 1650 10/22/19 1705  BP: (!) 141/53 (!) 138/56  Pulse: 71 75  Resp: 17 19  Temp: 36.6 C   SpO2: 94% (!) 88%    Last Pain:  Vitals:   10/22/19 1650  TempSrc:   PainSc: 0-No pain                 Tiajuana Amass

## 2019-10-27 ENCOUNTER — Other Ambulatory Visit: Payer: Medicare Other | Admitting: Internal Medicine

## 2019-10-27 ENCOUNTER — Other Ambulatory Visit: Payer: Self-pay

## 2019-10-27 DIAGNOSIS — E876 Hypokalemia: Secondary | ICD-10-CM

## 2019-10-27 LAB — BASIC METABOLIC PANEL
BUN/Creatinine Ratio: 26 (calc) — ABNORMAL HIGH (ref 6–22)
BUN: 36 mg/dL — ABNORMAL HIGH (ref 7–25)
CO2: 24 mmol/L (ref 20–32)
Calcium: 7.1 mg/dL — ABNORMAL LOW (ref 8.6–10.4)
Chloride: 97 mmol/L — ABNORMAL LOW (ref 98–110)
Creat: 1.36 mg/dL — ABNORMAL HIGH (ref 0.60–0.93)
Glucose, Bld: 77 mg/dL (ref 65–99)
Potassium: 3.6 mmol/L (ref 3.5–5.3)
Sodium: 136 mmol/L (ref 135–146)

## 2019-10-27 LAB — AEROBIC/ANAEROBIC CULTURE W GRAM STAIN (SURGICAL/DEEP WOUND): Gram Stain: NONE SEEN

## 2019-10-27 LAB — SURGICAL PATHOLOGY

## 2019-10-28 ENCOUNTER — Other Ambulatory Visit (HOSPITAL_COMMUNITY): Payer: Self-pay

## 2019-10-28 DIAGNOSIS — Z08 Encounter for follow-up examination after completed treatment for malignant neoplasm: Secondary | ICD-10-CM | POA: Diagnosis not present

## 2019-10-28 DIAGNOSIS — Z85828 Personal history of other malignant neoplasm of skin: Secondary | ICD-10-CM | POA: Diagnosis not present

## 2019-10-28 DIAGNOSIS — C4442 Squamous cell carcinoma of skin of scalp and neck: Secondary | ICD-10-CM | POA: Diagnosis not present

## 2019-10-28 DIAGNOSIS — C4441 Basal cell carcinoma of skin of scalp and neck: Secondary | ICD-10-CM | POA: Diagnosis not present

## 2019-10-28 MED ORDER — SILDENAFIL CITRATE 20 MG PO TABS: 60.0000 mg | ORAL_TABLET | Freq: Three times a day (TID) | ORAL | 1 refills | Status: DC

## 2019-11-02 ENCOUNTER — Other Ambulatory Visit: Payer: Self-pay

## 2019-11-02 ENCOUNTER — Ambulatory Visit (HOSPITAL_BASED_OUTPATIENT_CLINIC_OR_DEPARTMENT_OTHER)
Admission: RE | Admit: 2019-11-02 | Discharge: 2019-11-02 | Disposition: A | Discharge status: Home / Self Care | Payer: Medicare Other | Source: Ambulatory Visit | Attending: Internal Medicine | Admitting: Internal Medicine

## 2019-11-02 ENCOUNTER — Encounter (HOSPITAL_COMMUNITY): Payer: Self-pay | Admitting: *Deleted

## 2019-11-02 ENCOUNTER — Ambulatory Visit (HOSPITAL_COMMUNITY)
Admission: RE | Admit: 2019-11-02 | Discharge: 2019-11-02 | Disposition: A | Discharge status: Home / Self Care | Payer: Medicare Other | Source: Ambulatory Visit | Attending: Internal Medicine | Admitting: Internal Medicine

## 2019-11-02 ENCOUNTER — Encounter (HOSPITAL_BASED_OUTPATIENT_CLINIC_OR_DEPARTMENT_OTHER): Payer: Medicare Other | Attending: Internal Medicine | Admitting: Internal Medicine

## 2019-11-02 DIAGNOSIS — I87331 Chronic venous hypertension (idiopathic) with ulcer and inflammation of right lower extremity: Secondary | ICD-10-CM | POA: Insufficient documentation

## 2019-11-02 DIAGNOSIS — D638 Anemia in other chronic diseases classified elsewhere: Secondary | ICD-10-CM | POA: Diagnosis not present

## 2019-11-02 DIAGNOSIS — M810 Age-related osteoporosis without current pathological fracture: Secondary | ICD-10-CM | POA: Diagnosis not present

## 2019-11-02 DIAGNOSIS — S81801D Unspecified open wound, right lower leg, subsequent encounter: Secondary | ICD-10-CM | POA: Diagnosis not present

## 2019-11-02 DIAGNOSIS — M79672 Pain in left foot: Secondary | ICD-10-CM | POA: Diagnosis not present

## 2019-11-02 DIAGNOSIS — L97212 Non-pressure chronic ulcer of right calf with fat layer exposed: Secondary | ICD-10-CM | POA: Diagnosis not present

## 2019-11-02 DIAGNOSIS — R600 Localized edema: Secondary | ICD-10-CM

## 2019-11-02 DIAGNOSIS — L97215 Non-pressure chronic ulcer of right calf with muscle involvement without evidence of necrosis: Secondary | ICD-10-CM | POA: Diagnosis not present

## 2019-11-02 DIAGNOSIS — K219 Gastro-esophageal reflux disease without esophagitis: Secondary | ICD-10-CM | POA: Diagnosis not present

## 2019-11-02 DIAGNOSIS — I272 Pulmonary hypertension, unspecified: Secondary | ICD-10-CM

## 2019-11-02 DIAGNOSIS — G629 Polyneuropathy, unspecified: Secondary | ICD-10-CM | POA: Diagnosis not present

## 2019-11-02 DIAGNOSIS — I87311 Chronic venous hypertension (idiopathic) with ulcer of right lower extremity: Secondary | ICD-10-CM | POA: Diagnosis not present

## 2019-11-02 DIAGNOSIS — L97312 Non-pressure chronic ulcer of right ankle with fat layer exposed: Secondary | ICD-10-CM | POA: Insufficient documentation

## 2019-11-02 DIAGNOSIS — L97812 Non-pressure chronic ulcer of other part of right lower leg with fat layer exposed: Secondary | ICD-10-CM | POA: Diagnosis present

## 2019-11-02 DIAGNOSIS — M340 Progressive systemic sclerosis: Secondary | ICD-10-CM | POA: Diagnosis not present

## 2019-11-02 DIAGNOSIS — M79605 Pain in left leg: Secondary | ICD-10-CM | POA: Diagnosis not present

## 2019-11-02 DIAGNOSIS — I7301 Raynaud's syndrome with gangrene: Secondary | ICD-10-CM | POA: Diagnosis not present

## 2019-11-02 NOTE — Progress Notes (Signed)
Heart Failure TeleHealth Note  Due to national recommendations of social distancing due to [name], Audio/video telehealth visit is felt to be most appropriate for this patient at this time.  See MyChart message from today for patient consent regarding telehealth for [name].  Date:  11/02/2019   ID:  [name] [name], DOB [birth year], MRN [id number]  Location: Home  Provider location: [facility] Type of Visit: Established patient  PCP:  [name], MD  Cardiologist:  Glori Bickers, MD Primary HF: [name] [name]  Chief Complaint: Heart Failure follow-up   History of Present Illness:  [name] [name] is a 68 y.o. 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  PAH Meds: 1. Started on IV epoprostenol (Veletri) in 01/2013. She had intolerable side effects with Veletri.   2. Stopped Tyvaso in 2015. 3. February 2016 switched bosentan to Macitentan 4. Macitentan 10 and sildenafil 60 tid (did not tolerate Adcirca).  5. Selexipag added 5/18 - stopped due to SEs  She presents via audio/video conferencing for a telehealth visit today. On 10/22/19 underwent amputation of R 4th and 5th fingers. Says she is doing OK. Wounds seems to be healing but has some concern over possible staph infection at ring finger site. She has been concerned that she is on too much diuretics. Recently cut torsemide back to 40/20 and seems to be better. She is drinking more fluid now. Having some swelling in left calf. RLE is wrapped and no swelling. Not tender. No trauma to left leg.    Echo 01/16/2019 LVEF 60-65%, PA peak pressure 58 mm Hg  Echo 12/18 EF 60-65% RV dilated with mildly to moderately decreased function   Studies   6 MW 04/18/12 880 feet 6 MW 11/10/12 650 feet. Limited by legs. Sats down to 85% on 5L (sats at rest 95%)  6  MW 3/15 915 feet (279 m) 6 MW 2/16 930 feet (283 m)   RHC 04/19/17  RA = 4 RV = 80/8 PA = 79/17 (43) PCW = 6 Fick cardiac output/index = 4.1/2.4 PVR = 9.0 WU Ao sat = 94% PA sat = 59%, 62%    MAKALEIGH REINARD denies symptoms worrisome for COVID 19.   Past Medical History:  Diagnosis Date  . Anemia   . Arthritis   . DOE (dyspnea on exertion)   . Epistaxis   . History of chicken pox   . Hypertension   . Hypothyroidism   . Leg ulcer ([name] [name])   . Melanoma ([name] [name]) 1999  . 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[name] [name])   . Non Hodgkin's lymphoma ([name] [name])    Treated with chemo 2010, felt to be cured.  Marland Kitchen PAH (pulmonary artery hypertension) ([name] [name])   . Peripheral arterial occlusive disease ([name] [name])   . Pulmonary hypertension ([name] [name])   . Requires supplemental oxygen    2 liters-3liters when moving  . Sclerodermia generalized [name] [name])    Past Surgical History:  Procedure Laterality Date  . AMPUTATION Right 10/24/2017   Procedure: RAY resection right index;  Surgeon: Daryll Brod, MD;  Location: [place];  Service: Orthopedics;  Laterality: Right;  Axillary block  . AMPUTATION Right 11/21/2018   Procedure: AMPUTATION RAY RIGHT MIDDLE FINGER;  Surgeon: Daryll Brod, MD;  Location: [place];  Service: Orthopedics;  Laterality: Right;  . AMPUTATION Right 10/22/2019   Procedure: AMPUTATION  DIGIT RIGHT RING AND SMALL FINGER, DIGITAL SYMPATHECTOMY RIGHT RADIAL ULNAR ARCH;  Surgeon: Daryll Brod, MD;  Location: [name];  Service: Orthopedics;  Laterality: Right;  AXILARY  . apligraph  12-04-2102,   12-18-2012   bilateral calves   [name]  . biopsy on cerebellum    . COLONOSCOPY WITH PROPOFOL Left 10/07/2015   Procedure: COLONOSCOPY WITH PROPOFOL;  Surgeon: [name], MD;  Location: [name];  Service: [name];  Laterality: Left;  . ENDARTERECTOMY FEMORAL Right 09/14/2016   Procedure: ENDARTERECTOMY FEMORAL RIGHT;  Surgeon: [name], MD;  Location: [name];  Service: Vascular;   Laterality: Right;  . ENDOVENOUS ABLATION SAPHENOUS VEIN W/ LASER  07-24-2012   right greater saphenous vein by [name], M.D.   . ENDOVENOUS ABLATION SAPHENOUS VEIN W/ LASER  10-16-2012   left greater saphenous vein  by Dr. [name]  . ENDOVENOUS ABLATION SAPHENOUS VEIN W/ LASER Right 09-01-2015   endovenous laser ablation right greater saphenous vein by [name] MD  . ESOPHAGOGASTRODUODENOSCOPY N/A 05/07/2016   Procedure: ESOPHAGOGASTRODUODENOSCOPY (EGD);  Surgeon: [name], MD;  Location: [name];  Service: [name];  Laterality: N/A;  . ESOPHAGOGASTRODUODENOSCOPY (EGD) WITH PROPOFOL Left 10/07/2015   Procedure: ESOPHAGOGASTRODUODENOSCOPY (EGD) WITH PROPOFOL;  Surgeon: [name], MD;  Location: [name];  Service: [name];  Laterality: Left;  . GIVENS CAPSULE STUDY N/A 07/31/2016   Procedure: GIVENS CAPSULE STUDY;  Surgeon: [name], MD;  Location: [name];  Service: [name];  Laterality: N/A;  . melonoma removes Right    behind right knee  . PATCH ANGIOPLASTY Right 09/14/2016   Procedure: PATCH ANGIOPLASTY RIGHT FEMORAL ARTERY USING HEMASHIELD PLATINUM FINESSE PATCH;  Surgeon: [name], MD;  Location: [name];  Service: Vascular;  Laterality: Right;  . PERIPHERAL VASCULAR CATHETERIZATION N/A 09/07/2016   Procedure: Abdominal Aortogram w/Lower Extremity;  Surgeon: [name], MD;  Location: [name];  Service: Cardiovascular;  Laterality: N/A;  . RIGHT HEART CATH N/A 04/19/2017   Procedure: Right Heart Cath;  Surgeon: [name], MD;  Location: [place] [facility];  Service: Cardiovascular;  Laterality: N/A;  . RIGHT HEART CATHETERIZATION N/A 11/14/2012   Procedure: RIGHT HEART CATH;  Surgeon: [name], MD;  Location: [place] [name];  Service: Cardiovascular;  Laterality: N/A;  . rt little toe removal  10/1998  . stent left thigh  3/11  . TENDON TRANSFER Right 10/24/2017   Procedure: transfer extensor indecus propius/extensor  digitorum commomus index to middle ring and small;  Surgeon: Daryll Brod, MD;  Location: [place];  Service: Orthopedics;  Laterality: Right;  . TONSILLECTOMY    . TONSILLECTOMY AND ADENOIDECTOMY    . TUNNELED VENOUS CATHETER PLACEMENT  01/2013  . ULNAR HEAD EXCISION Right 10/24/2017   Procedure: darrach right ulna;  Surgeon: Daryll Brod, MD;  Location: [place];  Service: Orthopedics;  Laterality: Right;  . VEIN LIGATION AND STRIPPING  05/16/2012   Procedure: VEIN LIGATION AND STRIPPING;  Surgeon: [name], MD;  Location: [name];  Service: Vascular;  Laterality: Right;  Irrigation and Debridement right lower leg, ligation of saphenous vein.     Current Outpatient Medications  Medication Sig Dispense Refill  . acetaminophen (TYLENOL ARTHRITIS PAIN) 650 MG CR tablet Take 1,300 mg by mouth every 8 (eight) hours.     Marland Kitchen atorvastatin (LIPITOR) 20 MG tablet Take 1 tablet (20 mg total) by mouth daily. 90 tablet 3  . Biotin 5000 MCG TABS Take  5,000 mcg by mouth daily.    Marland Kitchen CALCIUM-MAGNESIUM-VITAMIN D PO Take 2 tablets by mouth daily.    . Cholecalciferol (VITAMIN D) 2000 units CAPS Take 2,000 Units by mouth daily.    . citalopram (CELEXA) 20 MG tablet TAKE 1 TABLET BY MOUTH EVERYDAY AT BEDTIME (Patient taking differently: Take 20 mg by mouth daily. ) 90 tablet 3  . clopidogrel (PLAVIX) 75 MG tablet TAKE 1 TABLET BY MOUTH EVERY DAY 90 tablet 3  . FERREX 150 150 MG capsule TAKE ONE CAPSULE BY MOUTH TWICE A DAY 180 capsule 3  . fish oil-omega-3 fatty acids 1000 MG capsule Take 1,000 mg by mouth 2 (two) times daily.     Marland Kitchen gabapentin (NEURONTIN) 300 MG capsule Take 600 mg by mouth 2 (two) times daily.    Marland Kitchen HYDROcodone-acetaminophen (NORCO) 5-325 MG tablet Take 1 tablet by mouth every 6 (six) hours as needed for moderate pain. 30 tablet 0  . levothyroxine (SYNTHROID) 50 MCG tablet TAKE 1 TABLET BY MOUTH DAILY BEFORE BREAKFAST (Patient taking differently: Take 50 mcg by mouth daily before breakfast. ) 90 tablet  1  . Multiple Vitamin (MULTIVITAMIN WITH MINERALS) TABS tablet Take 1 tablet by mouth daily.    Marland Kitchen NIFEdipine (ADALAT CC) 30 MG 24 hr tablet Take 30 mg by mouth daily.     . OPSUMIT 10 MG tablet Take 1 tablet (59m) by mouth daily. 30 tablet 11  . OXYGEN Inhale 2-3 L into the lungs continuous.     . pantoprazole (PROTONIX) 40 MG tablet Take 1 tablet (40 mg total) by mouth 2 (two) times daily. 60 tablet 1  . Probiotic CAPS Take 1 capsule by mouth daily.    . RESTASIS 0.05 % ophthalmic emulsion Place 1 drop into both eyes 2 (two) times daily.    . sildenafil (REVATIO) 20 MG tablet Take 3 tablets (60 mg total) by mouth 3 (three) times daily. 810 tablet 1  . spironolactone (ALDACTONE) 25 MG tablet TAKE 1 TABLET BY MOUTH EVERY DAY 90 tablet 1  . torsemide (DEMADEX) 20 MG tablet Take 439min the AM and 2070mn the PM 360 tablet 3  . valACYclovir (VALTREX) 500 MG tablet Take 1 tablet (500 mg total) by mouth 2 (two) times daily. (Patient taking differently: Take 500 mg by mouth 2 (two) times daily as needed (cold sores). ) 10 tablet 0  . vitamin B-12 (CYANOCOBALAMIN) 1000 MCG tablet Take 1,000 mcg by mouth daily.     No current facility-administered medications for this encounter.     Allergies:   Levofloxacin, Sulfa antibiotics, Levofloxacin in d5w, Doxycycline hyclate, and Penicillins   Social History:  The patient  reports that she has never smoked. She has never used smokeless tobacco. She reports current alcohol use. She reports that she does not use drugs.   Family History:  The patient's family history includes Cancer in her daughter; Colon cancer in an other family member; Hyperlipidemia in her mother; Lupus in her father; Lymphoma in her father.   ROS:  Please see the history of present illness.   All other systems are personally reviewed and negative.   Exam:  (Video/Tele Health Call; Exam is subjective and [name]/visual.) General:  Speaks in full sentences. No resp difficulty. Lungs: Normal  respiratory effort with conversation.  Abdomen: Non-distended per patient report Extremities: RLE wrapped. LLE swollen Diffuse scleroderma changes. Multiple finger amputations Neuro: Alert & oriented x 3.   Recent Labs: 12/02/2018: TSH 4.33 09/02/2019: ALT 18 10/22/2019:  Hemoglobin 10.3; Platelets 306 10/27/2019: BUN 36; Creat 1.36; Potassium 3.6; Sodium 136  Personally reviewed   Wt Readings from Last 3 Encounters:  10/22/19 57.2 kg (126 lb)  09/02/19 63 kg (139 lb)  07/24/19 63 kg (139 lb)      ASSESSMENT AND PLAN:  1. 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  Unable to tolerate IV epoprostenol, Tyvaso [name] selexipag.  She remains on Revatio and macitentan. - NYHA III  WHO class II-III dyspnea on macitentan 10 and revatio 60 tid -  RHC 4/18 as above -has now failed Selexipag as well as uptitration of her other PAH meds. Only other option would be to try to switch sildenafil to Adempas but will defer for now given intolerances. - Echo 12/18. Mild RV strain. Unable to estimate RVSP due to lack of significant TR  - Echo 01/16/2019 LVEF 60-65%, PA peak pressure 58 mm Hg. RV looks normal (much improved) - Volume status seems stable on torsemide 40/20. Can take extra torsemide as needed. Need to keep edema down to heal the wounds.  - Continue home oxygen.  2. Scleroderma:  - Advanced. She is followed by Dr. Amil Amen. - Adalat recently dropped back to 30 daily due to low BP  3. Chronic L Ulceration: Followed by Wound Care.   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  - now s/p amputation of R 4th/5th recently  4. PAD with LE wounds: - Follows with [facility]. Continue Plavix - Seeing Dr. Dellia Nims at [place]  5. Interstitial lung disease:  - Secondary to scleroderma.  - Stable on chronic O2.  - Followed by Dr. Lamonte Sakai  Recent chest CT reviewed and is stable 6. LLE calf swelling - schedule LE u/s to exclude DVT  Signed, Glori Bickers, MD  11/02/2019 11:47 AM  Advanced  [facility] Failure [place] [address] [phone] (office) [phone] (fax)

## 2019-11-02 NOTE — Patient Instructions (Signed)
We will contact you to schedule a lower extremity ultrasound  Your physician recommends that you schedule a follow-up appointment in: 4 weeks with a tele-health visit, we will call you to schedule this  If you have any questions or concerns before your next appointment please send [place] a message through [place] or call our office at [phone].

## 2019-11-02 NOTE — Progress Notes (Signed)
Per Dr [name] order placed for LE ven duplex to r/o DVT.  AVS sent to pt via mychart.  Message sent to schedulers to arrange.

## 2019-11-05 ENCOUNTER — Ambulatory Visit (HOSPITAL_COMMUNITY)
Admission: RE | Admit: 2019-11-05 | Discharge: 2019-11-05 | Disposition: A | Discharge status: Home / Self Care | Payer: Medicare Other | Source: Ambulatory Visit | Attending: Internal Medicine | Admitting: Internal Medicine

## 2019-11-05 ENCOUNTER — Other Ambulatory Visit: Payer: Self-pay

## 2019-11-05 DIAGNOSIS — M79605 Pain in left leg: Secondary | ICD-10-CM

## 2019-11-05 NOTE — Progress Notes (Signed)
VASCULAR LAB PRELIMINARY  PRELIMINARY  PRELIMINARY  PRELIMINARY  Left lower extremity venous duplex completed.    Preliminary report:  See CV proc for preliminary results.  Texted Dr. [name] results  [name] [name], [name] [name], RVT 11/05/2019, 2:35 PM

## 2019-11-05 NOTE — Progress Notes (Signed)
[name], [name] ([id number]) Visit Report for 11/02/2019 Debridement Details Patient Name: Date of Service: [name], [name] 11/02/2019 8:00 AM Medical Record Number:[id number] Patient Account Number: [id number] Date of Birth/Sex: Treating RN: 1951-05-02 (68 y.o. [name] [name] Primary Care Provider: [name] Other Clinician: Referring Provider: Treating Provider/Extender:Navi Ewton, Leotis Shames, MARY Weeks in Treatment: 397 Debridement Performed for Wound #5 Right,Medial Lower Leg Assessment: Performed By: Physician Ricard Dillon., MD Debridement Type: Debridement Severity of Tissue Pre Fat layer exposed Debridement: Level of Consciousness (Pre- Awake and Alert procedure): Pre-procedure Verification/Time Out Taken: Yes - 09:24 Start Time: 09:24 Pain Control: Other : BENZOCAINE 20% spray Total Area Debrided (L x W): 1.2 (cm) x 1.6 (cm) = 1.92 (cm) Tissue and other material Viable, Non-Viable, Slough, Subcutaneous, Slough debrided: Level: Skin/Subcutaneous Tissue Debridement Description: Excisional Instrument: Curette Bleeding: Minimum Hemostasis Achieved: Pressure End Time: 09:25 Procedural Pain: 0 Post Procedural Pain: 0 Response to Treatment: Procedure was tolerated well Level of Consciousness Awake and Alert (Post-procedure): Post Debridement Measurements of Total Wound Length: (cm) 1.2 Width: (cm) 1.6 Depth: (cm) 0.9 Volume: (cm) 1.357 Character of Wound/Ulcer Post Improved Debridement: Severity of Tissue Post Debridement: Fat layer exposed Post Procedure Diagnosis Same as Pre-procedure Electronic Signature(s) Signed: 11/02/2019 5:57:33 PM By: Linton Ham MD Signed: 11/02/2019 6:00:40 PM By: Levan Hurst RN, BSN Entered By: Linton Ham on 11/02/2019 09:53:39 -------------------------------------------------------------------------------- Debridement Details Patient Name: Date of Service: [name] [name]. 11/02/2019 8:00 AM Medical  Record Number:[id number] Patient Account Number: [id number] Date of Birth/Sex: Treating RN: 1951-11-20 (68 y.o. [name] [name] Primary Care Provider: [name] Other Clinician: Referring Provider: Treating Provider/Extender:Joleah Kosak, Leotis Shames, MARY Weeks in Treatment: 397 Debridement Performed for Wound #7 Right,Lateral Malleolus Assessment: Performed By: Physician Ricard Dillon., MD Debridement Type: Debridement Severity of Tissue Pre Fat layer exposed Debridement: Level of Consciousness (Pre- Awake and Alert procedure): Pre-procedure Yes - 09:24 Verification/Time Out Taken: Start Time: 09:24 Pain Control: Other : BENZOCAINE 20% spray Total Area Debrided (L x W): 1 (cm) x 0.6 (cm) = 0.6 (cm) Tissue and other material Viable, Non-Viable, Slough, Subcutaneous, Slough debrided: Level: Skin/Subcutaneous Tissue Debridement Description: Excisional Instrument: Curette Bleeding: Minimum Hemostasis Achieved: Pressure End Time: 09:25 Procedural Pain: 0 Post Procedural Pain: 0 Response to Treatment: Procedure was tolerated well Level of Consciousness Awake and Alert (Post-procedure): Post Debridement Measurements of Total Wound Length: (cm) 1 Width: (cm) 0.6 Depth: (cm) 0.3 Volume: (cm) 0.141 Character of Wound/Ulcer Post Improved Debridement: Severity of Tissue Post Debridement: Fat layer exposed Post Procedure Diagnosis Same as Pre-procedure Electronic Signature(s) Signed: 11/02/2019 5:57:33 PM By: Linton Ham MD Signed: 11/02/2019 6:00:40 PM By: Levan Hurst RN, BSN Entered By: Linton Ham on 11/02/2019 09:53:49 -------------------------------------------------------------------------------- HPI Details Patient Name: Date of Service: [name] [name]. 11/02/2019 8:00 AM Medical Record Number:[id number] Patient Account Number: [id number] Date of Birth/Sex: Treating RN: 1951-07-28 (68 y.o. [name] [name] Primary Care Provider: [name] Other Clinician: Referring Provider: Treating Provider/Extender:Zaida Reiland, Leotis Shames, MARY Weeks in Treatment: 397 History of Present Illness HPI Description: this is a patient who initially came to [place] for wounds on the medial malleoli bilaterally as well as her upper medial lower extremities bilaterally. These wounds eventually healed with assistance of Apligraf's bilaterally. While this was occurring she developed the current wound which opened into a fairly substantial wound on the right lower extremity. These are mostly secondary to venous stasis physiology however the patient also has underlying scleroderma, pulmonary hypertension. The wound has been making good progress lately with the  Hydrofera Blue-based dressing. 03/17/2015; patient had a arterial evaluation a year ago. Her right ABI was 0.86 left was 1.0. Toe brachial index was 0.41 on the right 0.45 on the left. Her bilateral common femoral artery waveforms were triphasic. Her white popliteal posterior tibial artery and anterior tibial artery waveforms were monophasic with good amplitude. Luteal artery waveforms were biphasic it was felt that her bilateral great toe pressures are of normal although adequate for tissue healing. 03/24/2015. The condition of this wound is not really improved that. He was covered with as fibrinous surface slough and eschar. This underwent an aggressive debridement with both a curette and scalpel. I still cannot really get down to what I can would consider to be a viable surface. There is no evidence of infection. Previous workup for ischemia roughly a year ago was negative nevertheless I think that continues to be a concern 04/07/15. The patient arrived for application of second Apligraf. Once again the surface of this wound is certainly less viable than I would like for an advanced treatment option. An aggressive debridement was done. She developed some arteriolar bleeding which required that along  pressure and silver alginate. 04/21/15: change in the condition of this wound. Once again it is covered in a gelatinous surface slough. After debridement today surface of the wound looks somewhat better but now a heeling surface 05/05/15 Apligraf #4 applied.Still a lot of slough on this wound. 05/19/15 Apligraf #5 applied. Still a lot of slough on this wound I did not aggressively remove this 06/02/15; continued copious amounts of surface slough. This debridement fairly easily. 06/08/2015 -- the last time she had a venous duplex study done was over 3 years ago and the surgery was prior to that. I have recommended that she sees Dr. early for a another opinion regarding a repeat venous duplex and possibly more endovenous ablation of vein stripping of micro-phlebotomies. 06/16/15; wound has a gelatinous surface eschar that the debridement fairly easily to a point. I don't disagree with the venous workup and perhaps even arterial re-evaluation. She is on prednisone 5 mg and continue his medications for her pulmonary artery hypertension I am not sure if the latter have any wound care healing issues I would need to investigate this. 06/23/15 continues with a gelatinous surface eschar with of fibrinous underlying. What I can see of this wound does not look unhealthy however I just can't get this material which I think is 2 different layers off. Empiric culture done 06/30/15; unfortunately not a lot of change in this wound. A gelatinous surface eschar is easily removed however it has a tight fibrinous surface underneath the. 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 The wound comes back and basically and unchanged state the. She has a gelatinous surface that is more easily removed however there is a tightly fibrinous surface underneath the. There is no evidence of infection. She has a vascular follow-up next month. I would have to inject her in order to do a more aggressive debridement of  this area 07/21/15 the wound is roughly in the same state albeit the debridement is done with greater ease. There is less of the fibrinous eschar underneath the. There is no evidence of infection. She has follow-up with vascular surgery next week. No evidence of surrounding infection. Her original distal wounds healed while this one formed. 07/28/15; wound is easier to debride. No wound erythema. She is seeing Dr. [name] next week. 08/18/15 Has been to [place]  for repeat ablation. Have been using medihoney pad with some improvement 08/25/15; absolutely no change in the condition of this wound in either its overall size or surface condition in many months now. At one point I had this down to [place] healthier surface I think with [facility] however this did not actually progress towards closure. Do not believe that the wound has actually deteriorated in terms of volume at all. We have been using a medihoney pad which allows easier debridement of the gelatinous eschar but again no overall actual improvement. the patient is going towards an ablation with pain and vascular which I think is scheduled for next week. The only other investigation that I could first see would be a biopsy. She does have underlying scleroderma 09/02/15 eschar is much easier to debridement however the base of this does not look particularly vibrant. We changed to Iodoflex. The patient had her ablation earlier this week 09/09/15 again the debridement over the base of this wound is easier and the base of the wound looks considerably better. We will continue the Iodoflex. Dr. Tawni Millers has expressed his satisfaction with the result of her ablation 09/15/15 once again the wound is relatively free of surface eschar. No debridement was done today. It has a pale- looking base to it. although this is not as deep as it once was it seems to be expanding especially inferiorly. She has had recent venous ablation but this is no closer to  healing.I've gone ahead and done a punch biopsy this from the inferior part of the wound close to normal skin 09/22/15: the wound is relatively free of surface eschar. There is some surrounding eschar. I'm not exactly sure at what level the surface is that I am seeing. 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 No evidence of atypical infection, malignancy. 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 No debridement was done 09/29/15; the wound surface is relatively free of surface eschar. There is eschar surrounding the walls of the wound. Aside from the improvement in the amount of surface slough. This wound has not progressed any towards closure. There is not even a surface that looks like there at this is ready. There is no evidence of any infection nor maligancy based on biopsy I did on 9/15. I continued with the Iodoflex however I am looking towards some alternative to try to promote some closure or filling in of this surface. Consider triple layer Oasis. Collagen did not result in adequate control of the surface slough 10/13/15; the patient was in hospital last week with severe anemia. The wound looks somewhat better after debridement although there is widening medially. There is no evidence of infection. 10/20/15; patient's wound on the right lateral lower leg is essentially unchanged. This underwent a light surface debridement and in general the debridement is easier and the surface looks improved. I noted in doing this on the side of the wound what appears to be a piece of calcium deposition. The patient noted that she had previous things on the tips of her fingers. 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 She follows with rheumatology and I have asked them to talk to her about this. In view of that S the nonhealing ulcer may have something to do with calcinosis  and also unrelieved Raynaud's disease in this area. I should note that her original wounds on the right and left medial malleolus and the inner  aspects of both legs just below the knees did however heal over 10/30/15; the patient's wound on the right lateral lower leg is essentially unchanged. I was able to remove some calcified material from the medial wound edge. 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 Otherwise the wound appears essentially unchanged there is less adherent surface eschar. Some of the calcified material was sent to pathology for analysis 11/17/15. The patient's wound on the right lateral leg is essentially unchanged. Wider Medially. 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 There is some surface slough but I did not debridement this. No evidence of surrounding infection. 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 This will make healing difficult 12/15/15; the wound has less depth surface has a fibrinous slough and calcifications in the wound edge no evidence of infection 12/22/15; the wound definitely has less Fibrinous slough on the base. Calcifications around the wound edges are still evident. Although the wound bed looks healthier it is still pale in appearance. Previous biopsy did not show malignancy 01/04/15; surgical debridement of nonviable slough and subcutaneous tissue the wound cleans up quite nicely but appears to be expanding outward calcifications around the wound edges are still there. 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 She is to see her rheumatologist I'll see if he has  any opinions on this. My punch biopsy done in September did not show calcifications although these are clearly evident. 01/19/16 light selective debridement of nonviable surface slough. There is epithelialization medially. This gives me reason for cautious optimism. She has been to see her rheumatologist, there is nothing that can be done for this type of soft tissue calcification associated with scleroderma 02/02/16 no debridement although there is a light surface slough. She has 2 peninsulas of skin 1 inferiorly and one medially. We continue to make a slight and slow but definite progressive here 02/16/16; light surface debridement with more attention to the circumference of the wound bed where the fibrinous eschar is more prevalent. No calcifications detected. She seems to have done nicely with the [facility] with some epithelialization and some improvement in the overall wound volume. 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 Continued attention to the circumference of the wound where the fibrinous eschar in calcinosis or prevalent. No calcifications were detected. She has continued improvement with Hydrofera Blue. The wound is no longer as deep 03/15/16 surgical debridement done to remove surface escha Especially around the circumference of the wound where there is nonviable subcutaneous tissue. In spite of this there is considerable improvement in the overall dimensions and depth of the wound. Islands of epithelialization are seen especially medially inferiorly and superiorly to a lesser extent. She is using Hydrofera Blue at home 03/29/16; surgical debridement done to remove surface eschar and nonviable subcutaneous tissue. This cleans up quite nicely mention slightly larger in terms of length and width however depth is less 04/12/16; continued gradual  improvement in terms of depth and the condition of the wound base. Debridement is done. Continuing long standing Hydrofera Blue at home with Kerlix Coban wraps 04/26/16; continued gradual  improvement in terms of depth and management as well as condition of the wound base. Surgical debridement done she continues with Hydrofera Blue. 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 She initially came to this clinic venous insufficiency ulcers which have long since healed 05/17/16 continued improvement in terms of the depth and measurements of this wound although she has a tightly adherent fibrinous slough each time. We've been continued with long standing Hydrofera Blue which seems to done as well for this wound is many advanced treatment options. 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 She also has chronic venous insufficiency. She has an irritation on her lateral right ankle secondary to our wraps 05/10/16; wound appears to be smaller especially on the medial aspect and especially in the width. Wound was debridement surface looks better. She is also been in the hospital apparently with anemia again she tells me she had an endoscopy. Since she got home after 3 days which I believe was sometime last week she has had an irritated painful area on the right lateral ankle surrounding the lateral malleolus 05/31/16; much more adherent surface slough today then recently although I don't think the dimensions of changed that much. A more aggressive debridement is required. The irritated area over her medial malleolus is more pruritic and painful and I don't think represents cellulitis 06/14/16; no major change in her wound dimensions however there is more tightly adherent surface slough which is disappointing. As well as she appears to have a new small area medially. Furthermore an irritated uncomfortable area on the lateral aspect of her  right foot just below the lateral malleolus. 06/21/16; I'm seeing the patient back in follow-up for the new areas under her major wound on the right anterior leg. She has been using Hydrofera Blue to this area probably for several months now and although the dressings seem to be helping for quite a period of time I think things have stagnated lately. She comes in today with a relatively tight adherent surface slough and really no changes in the wound shape or dimension. The 2 small areas she had inferiorly are tiny but still open they seem improved this well. There is no uncontrolled edema and I don't think there is any evidence of cellulitis. 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 Patient has had vascular evaluations both venous and arterial. I have biopsied this area. There is no obvious infection. The worrisome thing today is that she seems to be developing areas of erythema and epithelial damage on the medial aspect of the right foot. Also to a lesser degree inferior to the actual wound itself. Again I see no obvious changes to suggest cellulitis however as this is the only treatable option I will probably give her antibiotics. 07/13/16 no major change in the lady's large anterior right leg wound. Still covered with a very tightly adherent surface slough which is difficult to debridement. There is less erythema around this, culture last week grew pseudomonas I gave her ciprofloxacin. The area on her lateral right malleolus looks better- 08/02/16 the patient's wounds continued to decline. Her original large anterior right leg wound looks deeper. Still adherent surface slough that is difficult to debridement. She has a small area just below this and a punched-out wound just below her lateral malleolus. In the meantime she is been in  hospital with apparently an upper GI bleed on Plavix and  aspirin. She is now just on Plavix she received 3 units of packed cells 08/23/16; since I last saw this 3 weeks ago, the open large area on her right leg looks about the same syrup. She has a small satellite lesion just underneath this. The area on her medial right ankle is now a deep necrotic wound. I attempted to debridement this however there is just too much pain. 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 She follows with vascular surgery and is scheduled for an angiogram in early September 09/06/16; the patient is going for an arteriogram tomorrow. Her original large wound on the right calf is about the same the satellite lesion underneath it is about the same however the area on her medial ankle is now deeper with exposed tendon. I am no longer attempting to debride these wounds 09/20/16; the patient has undergone a right femoral endarterectomy and Dacron patch angioplasty. This seems to have helped the flow in her right leg. 10/04/16; Arrived today for aggressive debridement of the wounds on her right calf the original wound the one beneath it and a difficult area over her right lateral leg just above the lateral malleolus. 10/25/16; her 3 open wounds are about the same in terms of dimensions however the surface appears a lot healthier post debridement. Using Iodoflex 10/18/16 we have been using Iodoflex to her wounds which she tolerated with some difficulty. 10/11/16; has been using Santyl for a period of time with some improvement although again very adherent surface slough would prevent any attempted healing this. She has a original wound on the left calf, the satellite underneath that and the most recent wound on the right medial ankle. She has completed revascularization by Dr. early and has had venous ablation earlier. Want to go back to Iodoflex to see if week and get a healthier surface to this wound bed failing  this I think she'll need to be taken to the OR and I am prepared to call Dr. Marla Roe to discuss this. She is obviously not a good candidate for general anesthesia however.; 11/08/16; I put her on Iodoflex last time to see if I can get the wound bed any healthier and unfortunately today still had tremendous surface slough. 11/15/16; 4 weeks' worth of Iodoflex with not much improvement. Debridement on the major wounds on her left anterior leg is easier however this does not maintain from week to week. The punched-out area on her medial right ankle 11/29/16; I attempted to change the patient last visit to [facility] however she states this burned and was very uncomfortable therefore we gave her permission to go back to the eye out of complex which she already had at home. Also she noted a lot of pain and swelling on the lateral aspect of her leg before she traveled to [facility] for the holidays, I called her in doxycycline over the phone. This seems to have helped 12/06/16; Wounds unchanged by in large. Using Iodoflex 12/13/16; her wounds today actually looks somewhat better. The area on the right lateral lower leg has reasonably healthy-looking granulation and perhaps as actually filled and a bit. Debridement of the 2 wounds on the medial calf is easier and post debridement appears to have a healthier base. We have referred her to Dr. Migdalia Dk for consideration of operative debridement 12/20/16; we have a quick note from  Dr. Merri Ray who feels that the patient needs to be referred to an academic center/plastic surgeon. This is due to the complexity of the patient's medical issues as it applies to anything in the OR. We have been using Iodoflex 12/27/16; in general the wounds on the right leg are better in terms of the difficult to remove surface slough. She has been using Iodoflex. She is approved for Apligraf which I anticipated ordering in the next week or 2 when we  get a better-looking surface 01/04/16 the deep wounds on the right leg generally look better. Both of them are debrided further surface slough. The area on the lateral right leg was not debrided. She is approved for Apligraf I think I'll probably order this either next week or the week after depending on the surface of the wounds superiorly. We have been using Iodoflex which will continue until then 01/11/16; the deep wounds on the right leg again have a surface slough that requires debridement. I've not been able to get the wound bed on either one of these wounds down to what would be acceptable for an advanced skin stab- like Apligraf. The area on the medial leg has been improving. We have been using hot Iodoflex to all wounds which seems to do the best at at least limiting the nonviable surface 01/24/17; we have continued Iodoflex and all her wound areas. Her debridement Gen. he is easier and the surface underneath this looks viable. Nevertheless these are large area wounds with exposed muscle at least on the anterior parts. We have ordered Apligraf's for 2 weeks from now. The patient will be away next week 02/07/17; the patient was close to have first Apligraf today however we did not order one. I therefore replaced her Iodoflex. She essentially has 3 large punched-out areas on her right anterior leg and right medial ankle. 02/11/17; Apligraf #1 02/25/17; Apligraf #2. In general some improvement in the right medial ankle and right anterior leg wounds. The larger wound medially perhaps some better 03/11/17; Apligraf #3. In general continued improvement in the right medial ankle and the right anterior leg wounds. 03/25/17 Apligraf #4. In general continued improvement especially on the right medial ankle and the lower 04/08/17; Apligraf #5 in general continued improvement in all wound sites. 04/22/17; post Apligraf #5 her wound beds continued to look a lot better all of them up to the surface of  the surrounding skin. Had a caramel-colored slough that I did not debrided in case there is residual Apligraf effect. The wounds are as good as I have seen these looking quite some time. 04/29/17; we applied [place] and last week after completing her Apligraf. Wounds look as though they've contracted somewhat although they have a nonviable surface which was problematic in the past. Apparently she has been approved for further Apligraf's. We applied Iodosorb today after debridement. 05/06/17; we're fortunate enough today to be able to apply additional Apligraf approved by her insurance. In general all of her wounds look better Apligraf #6 05/24/17; Apligraf #7 continued improvement in all wounds 3 06/10/17 Apligraf #8. Continued improvement in the surface of all wounds. Not much of an improvement in dimensions as I might a follow 06/24/17 Apligraf #9 continued general improvement although not as much change in the wound areas I might of like. She has a new open area on the right anterior lateral ankle very small and superficial. She also has a necrotic wound on the tip of her right index finger probably secondary to  severe uncontrolled Raynaud's phenomenon. She is already on sildenafil and already seen her rheumatologist who gave her Keflex. 07/08/17; Apligraf #10. Generalized improvement although she has a small additional wound just medial to the major wound area. 07/22/17; after discussion we decided not to reorder any further Apligraf's although there is been considerable improvement with these it hasn't been recently. The major wound anteriorly looks better. Smaller wounds beneath this and the more recent one and laterally look about the same. The area on the right lateral lower leg looks about the same 07/29/17 this is a patient who is exceedingly complex. She has advanced scleroderma, crest syndrome including calcinosis, PAD status post revascularization, chronic venous insufficiency status  post ablations. She initially presented to this clinic with wounds on her bilateral lower legs however these closed. More recently we have been dealing with a large open area superiorly on the right anterior leg, a smaller wound underneath this and then another one on the right medial lower extremity. These improve significantly with 10 Apligraf applications. Over the last 2-3 weeks we are making good progress with Hydrofera Blue and these seem to be making progress towards closure 08/19/17; wounds continued to make good improvement with Hydrofera Blue and episodic aggressive debridements 08/26/17; still using Hydrofera Blue. Good improvements 09/09/17; using Hydrofera Blue continued improvement. Area on the lateral part of her right leg has only a small remaining open area. The small inferior satellite region is for all intents and purposes closed. Her major wound also is come in in terms of depth and has advancing epithelialization. 09/16/17; using Hydrofera Blue with continued improvement. The smaller satellite wound. We've closed out today along with a new were satellite wound medially. The area on her medial ankle is still open and her major wound is still open but making improvement. All using Hydrofera Blue. Currently 09/30/17; using Hydrofera Blue. Still a small open area on the lateral right ankle area and her original major wound seems to be making gradual and steady improvement. 10/14/17; still using Hydrofera Blue. Still too small open areas on the right lateral ankle. Her original major wound is horizontal and linear. The most problematic area paradoxically seems to be the area to the medial area wears I thought it would be the lateral. 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 10/28/17; still using Hydrofera Blue. Right lateral ankle has a very small open area superiorly on the most lateral part of the wound. Her original open wound has 2  open areas now separated by normal skin and we've redefined this. 11/11/17; still using Hydrofera Blue area and right lateral ankle continues to have a small open area on mostly the lateral part of the wound. Her original wound has 2 small open areas now separated by a considerable amount of normal skin 11/28/17; the patient called in slightly before Thanksgiving to report pain and erythema above the wound on the right leg. In the past this is responded well to treatment for cellulitis and I gave her over the phone doxycycline. She stated this resulted in fairly abrupt improvement. We have been using Hydrofera Blue for a prolonged period of time to the larger wound anteriorly into the remaining wound on her right right lateral ankle. The latter is just about closed with only a small linear area and the bottom of the [place]. 12/02/17; use endoform and left the dressing on since last visit. There is no tenderness and no evidence of infection. 12/16/17; patient  has been using Endoform but not making much progress. The 2 punched out open areas anteriorly which were the reminiscence of her major wound appear deeper. The area on the lateral aspect of her right calf also appears deeper. Also she has a puzzling tender swelling above her wound on the right leg. This seems larger than last time. Just above her wounds there appears to be some fluctuance in this area it is not erythematous and there is no crepitus 12/30/17; patient has been using Endoform up until last week we used Hydrofera Blue. Ultrasound of the swelling above her 2 major wounds last time was negative for a fluid collection. I gave her cefaclor for the erythema and tenderness in this area which seems better. Unfortunately both punched out areas anteriorly and the area on her right medial lower leg appear deeper. In fact the lateral of the wounds anteriorly actually looks as though it has exposed tendon and/or muscle sheath. She is not  systemically unwell. She is complaining of vaginitis type symptoms presumably Candida from her antibiotics. 01/06/18; we're using santyl. she has 2 punched out areas anteriorly which were initially part of a large wound. Unfortunately medially this is now open to tendon/muscle. All the wounds have the same adherent very difficult to debride surface. 01/20/18; 2 week follow-up using Santyl. She has the 2 punched out areas anteriorly which were initially part of her large surface wound there. Medially this still has exposed muscle. All of these have the same tightly adherent necrotic surface which requires debridement. PuraPly was not accepted by the patient's insurance however her insurance I think it changed therefore we are going to run Apligraf to gain 02/03/18; the patient has been using Santyl. The wound on the right lateral ankle looks improved and the 2 areas anteriorly on the right leg looks about the same. The medial one has exposed muscle. The lateral 1 requiresdebridement. We use PuraPly today for the first week 02/10/18; PuraPly #2. The patient has 3 wounds. The area on the right lateral ankle, 2 areas anteriorly that were part of her original large wound in this area the medial one has exposed muscle. All of the wounds were lightly debrided with a number 3 curet. PuraPly #2 applied the lateral wound on the calf and the right lateral ankle look better. 02/17/18; PuraPly #3. Patient has 3 wounds. The area on the right lateral ankle in 2 areas internally that were part of her original large wound. The lateral area has exposed muscle. She arrived with some complaints of pain around the right ankle. 02/24/18; PuraPly #4; not much change in any of the 3 wound areas. Right lateral ankle, right lateral calf. Both of these required debridement with a #3 curet. She tolerates this marginally. The area on the medial leg still has exposed muscle. Not much change in dimensions 03/03/18 PuraPly #5. The area  on the medial ankle actually looks better however the 2 separate areas that were original parts of the larger right anterior leg wound look as though they're attempting to coalesce. 03/10/18; PuraPly #6. The area on the medial ankle actually continues to look and measures smaller however the 2 separate areas that were part of the original large wound on the right anterior leg have now coalesced. There hasn't been much improvement here. The lateral area actually has underlying exposed muscle 03/17/18-she is here in follow-up evaluation for ulcerations to the right lower extremity. She is voicing no complaints or concerns. She tolerated debridement. Puraply#7 placed 03/24/18;  difficult right lower extremity ulcerations. PuraPly #8 place. She is been approved for [employer]. She did very well with Apligraf today however she is apparently reached her "lifetime max" 03/31/18; marginal improvement with PuraPly although her wounds looked as good as they have in several weeks today. Used TheraSkins #1 04/14/18 TheraSkin #2 today 04/28/18 TheraSkins #3. Wound slightly improved 05/12/18; TheraSkin skin #4. Wound response has been variable. 05/27/18 TheraSkin #5. Generally improvement in all wound areas. I've also put her in 3 layer compression to help with the severe venous hypertension 06/09/18; patient is done quite well with the TheraSkins unfortunately we have no further applications. I also put her in 3 layer compression last week and that really seems to of helped. 06/16/18; we have been using silver collagen. Wounds are smaller. Still be open area to the muscle layer of her calf however even that is contracted somewhat. She tells me that at night sometimes she has pain on the right lateral calf at the site of her lower wound. Notable that I put her into 3 layer compression about 3 weeks ago. She states that she dangles her leg over the bed that makes it feel better but she does not describe claudication  during the day She is going to call her secondary insurance to see if they will continue to cover advanced treatment products I have reviewed her arterial studies from 01/22/17; this showed an ABI in the right of 1 and on the left noncompressible. TBI on the right at 0.30 on the left at 0.34. It is therefore possible she has significant PAD with medial calcification falsely elevating her ABI into the normal range. I'll need to be careful about asking her about this next week it's possible the 3 layer compression is too much 06/23/18; was able to reapply TheraSkin 1 today. Edema control is good and she is not complaining of pain no claudication 07/07/18;no major change. New wound which was apparently a taper removal injury today in our clinic between her 2 wounds on the right calf TheraSkins #2 07/14/18; I think there is some improvement in the right lateral ankle and the medial part of her wound. There is still exposed muscle medially. 07/28/18; two-week follow-up. TheraSkins#3. Unfortunately no major change. She is not a candidate I don't think for skin grafting due to severe venous hypertension associated with her scleroderma and pulmonary hypertension 08/11/18 Patient is here today for her [employer] application #34 (#5 of the second set). She seems to be doing well and in the base of the wound appears to show some progress at this point. This is the last approved Theraskin of the second set. 08/25/18; she has completed TheraSkin. There has been some improvement on the right lateral calf wound as well as the anterior leg wounds. The open area to muscle medially on the anterior leg wound is smaller. I'm going to transition her back to [facility] under Kerlix Coban change every second day. She reports that she had some calcification removed from her right upper arm. We have had previous problems with calcifications in her wounds on her legs but that has not happened recently 09/08/18;using  Hydrofera Blue on both her wound areas. Wounds seem to of contracted nicely. She uses Kerlix Coban wrap and changes at home herself 09/22/2018; using Hydrofera Blue on both her wound areas. Dimensions seem to have come down somewhat. There is certainly less depth in the medial part of the mid tibia wound and I do not think there is any  exposed muscle at this point. 10/06/2018; 2-week follow-up. Using [facility] on her wound areas. Dimensions have come down nicely both on the right lateral ankle area in the right mid tibial area. She has no new complaints 10/20/2018; 2-week follow-up. She is using Hydrofera Blue. Not much change from the last time she was here. The area on the lateral ankle has less depth although it has raised edges on one side. I attempted to remove as much of the raised edge as I could without creating more additional wounds. The area on the right anterior mid tibia area looks the same. 11/03/2018; 2-week follow-up she is using Hydrofera Blue. On the right anterior leg she now has 2 wounds separated by a large area of normal skin. The area on the medial part still has I think exposed muscle although this area itself is a lot smaller. The area laterally has some depth. Both areas with necrotic debris. The area on her right lateral ankle has come in nicely 11/17/2018; patient continues to use Hydrofera Blue. We have been increasing separation of the 2 wounds anteriorly which were at one point joint the area on the right lateral calf continues to have I think some improvement in depth. 12/08/2018; patient continues to use Hydrofera Blue. There is some improvement in the area on the right lateral calf. The 2 areas that were initially part of the original large wound in the mid right tibia are probably about the same. In fact the medial area is probably somewhat larger. We will run puraply through the patient's insurance 12/22/2018; she has been using Hydrofera Blue. We have small  wounds on the right lateral calf and 2 small areas that were initially part of a large wound in the right mid tibia. We applied pure apply #1 today. 12/29/2018; we applied puraply #2. Her wounds look somewhat better especially on the right lateral calf and the lateral part of her original wound in the mid tibial area. 01/05/2019; perhaps slightly improved in terms of wound bed condition but certainly not as much improvement as I might of liked. Puraply #3 1/13: we did not have a correct sized puraply to apply. wounds more pinched out looking, I increased her compression to 3 layer last week to help with significant multilevel venous hypertension. Since then I've reviewed her arterial status. She has a right femoral endarterectomy and a distal left SFA stent. She was being followed by Dr. [name] for a period however she does not appear to have seen him in 3 years. I will set up an appointment. 1/20. The patient has an additional wound on the right lateral calf between the distal wound and proximal wounds. We did not have Puraply last week. Still does not have a follow-up with Dr. early 1/27: Follow-up with Dr. early has been arranged apparently with follow-up noninvasive studies. Wounds are measuring roughly the same although they certainly look smaller 2/3; the patient had non invasive studies. Her ABIs on the right were 0.83 and on the left 1.02 however there was no great toe pressure bilaterally. Also worrisome monophasic waveforms at the PTA and dorsalis pedis. We are still using Puraply. We have had some improvement in all of the wounds especially the lateral part of the mid tibial area. 2/10; sees Dr. early of vein and vascular re-arterial studies next week. Puraply reapplied today. 2/17; Dr. early of vein and vascular his appointment is tomorrow. Puraply reapplied after debridement of all wounds 2/24; the patient saw Dr. early I reviewed  his note. sHe noted the previous right femoral  endarterectomy with a Dacron patch. He also noted the normal ABI and the monophasic waveforms suggesting tibial disease. Overall he did not feel that she had any evidence of arterial insufficiency that would impair her wound healing. He did note her venous disease as well. He suggested PRN follow-up. 3/2; I had the last puraply applied today. The original wounds over the mid tibia area are improved where is the area on the right lower leg is not 3/9; wounds are smaller especially in the right mid tibia perhaps slightly in the right lateral calf. We finished with puraply and went to endoform today 3/23; the patient arrives after 2 weeks. She has been using endoform. I think all of her wounds look slightly better which includes the area on the right lateral calf just above the right lateral malleolus and the 2 in the right mid tibia which were initially part of the same wound. 4/27; TELEHEALTH visit; the patient was seen for telehealth visit today with her consent in the middle of the worldwide epidemic. Since she was last here she called in for antibiotics with pain and tenderness around the area on the right medial ankle. I gave her empiric doxycycline. She states this feels better. She is using endoform on both of these areas 5/11 TELEHEALTH; the patient was seen for telehealth visit today. She was accompanied at home by her husband. She has severe pulmonary hypertension accompanied scleroderma and in the face of the Covid epidemic cannot be safely brought into our clinic. We have been using endoform on her wound areas. There are essentially 3 wound areas now in the left mid tibia now 2 open areas that it one point were connected and one on the right lateral ankle just above the malleolus. 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 She cannot come to clinic. The wounds were reviewed today via telehealth. She has severe chronic venous hypertension which I think is centrally mediated. She has wounds on her right anterior tibia and right lateral ankle area. These are chronic. She has been using endoform. 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 We have been following her from telehealth. She has severe chronic venous hypertension which may be mostly centrally mediated secondary to right heart heart failure. She has wounds on her right anterior tibia and right lateral ankle these are chronic we have been using endoform 6/22; TELEHEALTH; this patient was seen today via telehealth. 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 Since the last time we had contact with this patient she developed some pain and erythema around the wound on her right lateral malleolus/ankle and we put in antibiotics for her. This is resulted in good improvement with resolution of the erythema and tenderness. I changed her to silver alginate last time. We had been using endoform for an extended period of time 7/13; TELEHEALTH; this patient was seen today via telehealth. 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 She would continue to be at a prohibitive risk to be brought into our clinic unless this was absolutely necessary. These visits  have been done with her approval as well as her husband. We have been using silver alginate to the areas on the right mid tibia and right lateral lower leg. 7/27 TELEHEALTH; patient was seen along with her husband today via telehealth. 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 We changed her to sample last visit. She has 2 areas a chronic wound on her right mid tibia and one just above her ankle. These were not the original wounds when she came into this clinic but she developed them during treatment 8/17; she comes in for her first face-to-face visit in a long period. She has a remaining area just medial to the right tibia which is the last open part of her large wound across this area. She also has an area on the right lateral lower leg. We prescribed Santyl last telehealth visit but they were concerned that this was making a deeper so they put silver alginate on it last week. Her husband changes the dressings. 8/31; using Santyl to the 2 wound areas some improvement in wound surfaces. Husband has surgery in 2 weeks we will put her out 3 weeks. Any of the advanced treatment options that I can think of that would be eligible for this wound would also cause her to have to come in weekly. The risk that the patient is just too high 9/21. Using Santyl to the 2 wound areas. Both of these are somewhat better although the medial mid tibia area still has exposed muscle. Lateral ankle requiring debridement. Using Santyl 10/12; using Santyl to the 2 wound areas. One on the right lateral ankle and the other in the medial calf which still has exposed muscle. Both areas have come down in size and have better looking surfaces. She has made nice progress with santyl 11/2; we have been using Santyl to the 2 wound areas. Right lateral ankle and the other in the mid tibia area the medial part of this still has exposed muscle. Electronic Signature(s) Signed: 11/02/2019 5:57:33 PM By: Linton Ham MD Entered By: Linton Ham on 11/02/2019 09:54:32 -------------------------------------------------------------------------------- Physical Exam Details Patient Name: Date of Service: [name] [name] 11/02/2019 8:00 AM Medical Record Number:[id number] Patient Account  Number: [id number] Date of Birth/Sex: Treating RN: March 26, 1951 (68 y.o. [name] [name] Primary Care Provider: [name] Other Clinician: Referring Provider: Treating Provider/Extender:Niley Helbig, Leotis Shames, MARY Weeks in Treatment: 397 Constitutional Sitting or standing Blood Pressure is within target range for patient.. Pulse regular and within target range for patient.Marland Kitchen Respirations regular, non-labored and within target range.. Temperature is normal and within the target range for the patient.Marland Kitchen Appears in no distress. Notes Wound exam; right mid tibia area. Lateral part of this is reopened very tiny open area medial areas appears smaller. Both areas debrided by a #3 curette. Right lateral ankle. Debrided with a #3 curette. Wound is clean. No evidence of surrounding infection Electronic Signature(s) Signed: 11/02/2019 5:57:33 PM By: Linton Ham MD Entered By: Linton Ham on 11/02/2019 09:56:23 -------------------------------------------------------------------------------- Physician Orders Details Patient Name: Date of Service: [name] [name]. 11/02/2019 8:00 AM Medical Record Number:[id number] Patient Account Number: [id number] Date of Birth/Sex: Treating RN: [birth year] (68 y.o. [name] Primary Care Provider: [name] Other Clinician: Referring Provider: Treating Provider/Extender:Darnesha Diloreto, Leotis Shames, MARY Weeks in Treatment: [phone] Verbal / Phone Orders: No Diagnosis Coding Follow-up Appointments Return appointment in 3 weeks. Dressing Change Frequency Change dressing every day. -  all wounds Skin Barriers/Peri-Wound Care Moisturizing lotion - both legs Wound Cleansing May shower and wash wound with soap and water. Primary Wound Dressing Wound #14 Right,Medial,Anterior Lower Leg Collagen - apply over santyl Santyl Ointment Wound #5 Right,Medial Lower Leg Collagen - apply over santyl Santyl Ointment Wound #7 Right,Lateral  Malleolus Collagen - apply over santyl Santyl Ointment Secondary Dressing Wound #14 Right,Medial,Anterior Lower Leg Dry Gauze Wound #5 Right,Medial Lower Leg Dry Gauze Wound #7 Right,Lateral Malleolus Dry Gauze Edema Control Kerlix and Coban - Right Lower Extremity Avoid standing for long periods of time Elevate legs to the level of the heart or above for 30 minutes daily and/or when sitting, a frequency of: - throughout the day Electronic Signature(s) Signed: 11/02/2019 5:57:33 PM By: Linton Ham MD Signed: 11/05/2019 5:16:49 PM By: Kela Millin Entered By: Kela Millin on 11/02/2019 09:29:51 -------------------------------------------------------------------------------- Problem List Details Patient Name: Date of Service: [name] [name]. 11/02/2019 8:00 AM Medical Record Number:[id number] Patient Account Number: [id number] Date of Birth/Sex: Treating RN: [birth year] (68 y.o. 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 02/24/2015 No Yes of calf and inflammation I87.331 Chronic venous hypertension (idiopathic) with ulcer 10/04/2016 No Yes and inflammation of right lower extremity Inactive Problems ICD-10 Code Description Active Date Inactive Date 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/19/2016 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 [name] [name] Primary Care Provider: [name] Other Clinician: Referring Provider: Treating Provider/Extender:Jekhi Bolin, Leotis Shames, MARY Weeks in Treatment: 397 Subjective History of Present Illness (HPI) this is a patient who initially came to [place] for wounds on the medial malleoli bilaterally as well as her upper medial lower extremities bilaterally. These wounds eventually healed with assistance of Apligraf's bilaterally. While this was occurring she developed the current wound which opened into a fairly substantial wound on the right lower extremity. These are mostly secondary to venous stasis physiology however the patient also has underlying scleroderma, pulmonary hypertension. The wound has been making good progress lately with the Hydrofera Blue-based dressing. 03/17/2015; patient had a arterial evaluation a year ago. Her right ABI was 0.86 left was 1.0. Toe brachial index was 0.41 on the right 0.45 on the left. Her bilateral common femoral artery waveforms were triphasic. Her white popliteal posterior tibial artery and anterior tibial artery waveforms were monophasic with good amplitude. Luteal artery waveforms were biphasic it was felt that her bilateral great toe pressures are of normal although adequate for tissue healing. 03/24/2015. The condition of this wound is not really improved that. He was covered with as fibrinous surface slough and eschar. This underwent an aggressive debridement with both a curette and scalpel. I still cannot really get down to what I can would  consider to be a viable surface. There is no  evidence of infection. Previous workup for ischemia roughly a year ago was negative nevertheless I think that continues to be a concern 04/07/15. The patient arrived for application of second Apligraf. Once again the surface of this wound is certainly less viable than I would like for an advanced treatment option. An aggressive debridement was done. She developed some arteriolar bleeding which required that along pressure and silver alginate. 04/21/15: change in the condition of this wound. Once again it is covered in a gelatinous surface slough. After debridement today surface of the wound looks somewhat better but now a heeling surface 05/05/15 Apligraf #4 applied.Still a lot of slough on this wound. 05/19/15 Apligraf #5 applied. Still a lot of slough on this wound I did not aggressively remove this 06/02/15; continued copious amounts of surface slough. This debridement fairly easily. 06/08/2015 -- the last time she had a venous duplex study done was over 3 years ago and the surgery was prior to that. I have recommended that she sees Dr. early for a another opinion regarding a repeat venous duplex and possibly more endovenous ablation of vein stripping of micro-phlebotomies. 06/16/15; wound has a gelatinous surface eschar that the debridement fairly easily to a point. I don't disagree with the venous workup and perhaps even arterial re-evaluation. She is on prednisone 5 mg and continue his medications for her pulmonary artery hypertension I am not sure if the latter have any wound care healing issues I would need to investigate this. 06/23/15 continues with a gelatinous surface eschar with of fibrinous underlying. What I can see of this wound does not look unhealthy however I just can't get this material which I think is 2 different layers off. Empiric culture done 06/30/15; unfortunately not a lot of change in this wound. A gelatinous surface eschar is easily  removed however it has a tight fibrinous surface underneath the. 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 The wound comes back and basically and unchanged state the. She has a gelatinous surface that is more easily removed however there is a tightly fibrinous surface underneath the. There is no evidence of infection. She has a vascular follow-up next month. I would have to inject her in order to do a more aggressive debridement of this area 07/21/15 the wound is roughly in the same state albeit the debridement is done with greater ease. There is less of the fibrinous eschar underneath the. There is no evidence of infection. She has follow-up with vascular surgery next week. No evidence of surrounding infection. Her original distal wounds healed while this one formed. 07/28/15; wound is easier to debride. No wound erythema. She is seeing Dr. [name] next week. 08/18/15 Has been to [place] for repeat ablation. Have been using medihoney pad with some improvement 08/25/15; absolutely no change in the condition of this wound in either its overall size or surface condition in many months now. At one point I had this down to [place] healthier surface I think with [facility] however this did not actually progress towards closure. Do not believe that the wound has actually deteriorated in terms of volume at all. We have been using a medihoney pad which allows easier debridement of the gelatinous eschar but again no overall actual improvement. the patient is going towards an ablation with pain and vascular which I think is scheduled for next week. The only other investigation that I could first see would be a biopsy. She does have underlying scleroderma 09/02/15  eschar is much easier to debridement however the base of this does not look particularly vibrant. We changed to Iodoflex. The patient had her ablation earlier this week 09/09/15 again the debridement over the base of this wound is  easier and the base of the wound looks considerably better. We will continue the Iodoflex. Dr. Tawni Millers has expressed his satisfaction with the result of her ablation 09/15/15 once again the wound is relatively free of surface eschar. No debridement was done today. It has a pale- looking base to it. although this is not as deep as it once was it seems to be expanding especially inferiorly. She has had recent venous ablation but this is no closer to healing.I've gone ahead and done a punch biopsy this from the inferior part of the wound close to normal skin 09/22/15: the wound is relatively free of surface eschar. There is some surrounding eschar. I'm not exactly sure at what level the surface is that I am seeing. 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 No evidence of atypical infection, malignancy. 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 No debridement was done 09/29/15; the wound surface is relatively free of surface eschar. There is eschar surrounding the walls of the wound. Aside from the improvement in the amount of surface slough. This wound has not progressed any towards closure. There is not even a surface that looks like there at this is ready. There is no evidence of any infection nor maligancy based on biopsy I did on 9/15. I continued with the Iodoflex however I am looking towards some alternative to try to promote some closure or filling in of this surface. Consider triple layer Oasis. Collagen did not result in adequate control of the surface slough 10/13/15; the patient was in hospital last week with severe anemia. The wound looks somewhat better after debridement although there is widening medially. There is no evidence of infection. 10/20/15; patient's wound on the right lateral lower leg is essentially unchanged. This underwent a light surface debridement and in general the debridement is easier and the surface looks  improved. I noted in doing this on the side of the wound what appears to be a piece of calcium deposition. The patient noted that she had previous things on the tips of her fingers. 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 She follows with rheumatology and I have asked them to talk to her about this. In view of that S the nonhealing ulcer may have something to do with calcinosis and also unrelieved Raynaud's disease in this area. I should note that her original wounds on the right and left medial malleolus and the inner aspects of both legs just below the knees did however heal over 10/30/15; the patient's wound on the right lateral lower leg is essentially unchanged. I was able to remove some calcified material from the medial wound edge. 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 Otherwise the wound appears essentially unchanged there is less adherent surface eschar. Some of the calcified material was sent to pathology for analysis 11/17/15. The patient's wound on the right lateral leg is essentially unchanged. Wider Medially. 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 There is some surface slough but I did not debridement this. No evidence of surrounding infection. 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 This will make healing difficult 12/15/15; the wound has less depth surface has a fibrinous slough and calcifications in the wound edge no evidence of infection 12/22/15; the wound definitely has less Fibrinous slough on the base. Calcifications around the wound edges are still  evident. Although the wound bed looks healthier it is still pale in appearance. Previous biopsy did not show malignancy 01/04/15; surgical debridement of nonviable slough and subcutaneous tissue the wound cleans up quite nicely but appears to be expanding outward calcifications around the wound edges are still there. 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 She is to see her rheumatologist I'll see if he has any opinions on this. My punch biopsy done in September did not show calcifications although these are clearly evident. 01/19/16 light selective debridement of nonviable surface slough. There is epithelialization medially. This gives me reason for cautious optimism. She has been to see her rheumatologist, there is nothing that can be done for this type of soft tissue calcification associated with scleroderma 02/02/16 no debridement although there is a light surface slough. She has 2 peninsulas of skin 1 inferiorly and one medially. We continue to make a slight and slow but definite progressive here 02/16/16; light surface debridement with more attention to the circumference of the wound bed where the fibrinous eschar is more prevalent. No calcifications detected. She seems to have done nicely with the [facility] with some epithelialization and some improvement in the overall wound volume. 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 Continued attention to the circumference of the wound where the fibrinous eschar in calcinosis or prevalent. No calcifications were detected. She has continued improvement with Hydrofera Blue. The wound is no longer as deep 03/15/16 surgical debridement done to remove surface escha Especially around the circumference of the wound where there is nonviable subcutaneous tissue. In spite of this there is  considerable improvement in the overall dimensions and depth of the wound. Islands of epithelialization are seen especially medially inferiorly and superiorly to a lesser extent. She is using Hydrofera Blue at home 03/29/16; surgical debridement done to remove surface eschar and nonviable subcutaneous tissue. This cleans up quite nicely mention slightly larger in terms of length and width however depth is less 04/12/16; continued gradual improvement in terms of depth and the condition of the wound base. Debridement is done. Continuing long standing Hydrofera Blue at home with Kerlix Coban wraps 04/26/16; continued gradual improvement in terms of depth and management as well as condition of the wound base. Surgical debridement done she continues with Hydrofera Blue. 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 She initially came to this clinic venous insufficiency ulcers which have long since healed 05/17/16 continued improvement in terms of the depth and measurements of this wound although she has a tightly adherent fibrinous slough each time. We've been continued with long standing Hydrofera Blue which seems to done as well for this wound is many advanced treatment options. 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 She also has chronic venous insufficiency. She has an irritation on her lateral right ankle secondary to our wraps 05/10/16; wound appears to be  smaller especially on the medial aspect and especially in the width. Wound was debridement surface looks better. She is also been in the hospital apparently with anemia again she tells me she had an endoscopy. Since she got home after 3 days which I believe was sometime last week she has had an irritated painful area on the right lateral ankle surrounding the lateral malleolus 05/31/16; much more adherent surface slough today then recently although I don't think the  dimensions of changed that much. A more aggressive debridement is required. The irritated area over her medial malleolus is more pruritic and painful and I don't think represents cellulitis 06/14/16; no major change in her wound dimensions however there is more tightly adherent surface slough which is disappointing. As well as she appears to have a new small area medially. Furthermore an irritated uncomfortable area on the lateral aspect of her right foot just below the lateral malleolus. 06/21/16; I'm seeing the patient back in follow-up for the new areas under her major wound on the right anterior leg. She has been using Hydrofera Blue to this area probably for several months now and although the dressings seem to be helping for quite a period of time I think things have stagnated lately. She comes in today with a relatively tight adherent surface slough and really no changes in the wound shape or dimension. The 2 small areas she had inferiorly are tiny but still open they seem improved this well. There is no uncontrolled edema and I don't think there is any evidence of cellulitis. 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 Patient has had vascular evaluations both venous and arterial. I have biopsied this area. There is no obvious infection. The worrisome thing today is that she seems to be developing areas of erythema and epithelial damage on the medial aspect of the right foot. Also to a lesser degree inferior to the actual wound itself. Again I see no obvious changes to suggest cellulitis however as this is the only treatable option I will probably give her antibiotics. 07/13/16 no major change in the lady's large anterior right leg wound. Still covered with a very tightly adherent surface slough which is difficult to debridement. There is less erythema around this, culture last week  grew pseudomonas I gave her ciprofloxacin. The area on her lateral right malleolus looks better- 08/02/16 the patient's wounds continued to decline. Her original large anterior right leg wound looks deeper. Still adherent surface slough that is difficult to debridement. She has a small area just below this and a punched-out wound just below her lateral malleolus. In the meantime she is been in hospital with apparently an upper GI bleed on Plavix and aspirin. She is now just on Plavix she received 3 units of packed cells 08/23/16; since I last saw this 3 weeks ago, the open large area on her right leg looks about the same syrup. She has a small satellite lesion just underneath this. The area on her medial right ankle is now a deep necrotic wound. I attempted to debridement this however there is just too much pain. 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 She follows with vascular surgery and is scheduled for an angiogram in early September 09/06/16; the patient is going for an arteriogram tomorrow. Her original large wound on the right  calf is about the same the satellite lesion underneath it is about the same however the area on her medial ankle is now deeper with exposed tendon. I am no longer attempting to debride these wounds 09/20/16; the patient has undergone a right femoral endarterectomy and Dacron patch angioplasty. This seems to have helped the flow in her right leg. 10/04/16; Arrived today for aggressive debridement of the wounds on her right calf the original wound the one beneath it and a difficult area over her right lateral leg just above the lateral malleolus. 10/25/16; her 3 open wounds are about the same in terms of dimensions however the surface appears a lot healthier post debridement. Using Iodoflex 10/18/16 we have been using Iodoflex to her wounds which she tolerated with some difficulty. 10/11/16; has been  using Santyl for a period of time with some improvement although again very adherent surface slough would prevent any attempted healing this. She has a original wound on the left calf, the satellite underneath that and the most recent wound on the right medial ankle. She has completed revascularization by Dr. early and has had venous ablation earlier. Want to go back to Iodoflex to see if week and get a healthier surface to this wound bed failing this I think she'll need to be taken to the OR and I am prepared to call Dr. Marla Roe to discuss this. She is obviously not a good candidate for general anesthesia however.; 11/08/16; I put her on Iodoflex last time to see if I can get the wound bed any healthier and unfortunately today still had tremendous surface slough. 11/15/16; 4 weeks' worth of Iodoflex with not much improvement. Debridement on the major wounds on her left anterior leg is easier however this does not maintain from week to week. The punched-out area on her medial right ankle 11/29/16; I attempted to change the patient last visit to [facility] however she states this burned and was very uncomfortable therefore we gave her permission to go back to the eye out of complex which she already had at home. Also she noted a lot of pain and swelling on the lateral aspect of her leg before she traveled to [facility] for the holidays, I called her in doxycycline over the phone. This seems to have helped 12/06/16; Wounds unchanged by in large. Using Iodoflex 12/13/16; her wounds today actually looks somewhat better. The area on the right lateral lower leg has reasonably healthy-looking granulation and perhaps as actually filled and a bit. Debridement of the 2 wounds on the medial calf is easier and post debridement appears to have a healthier base. We have referred her to Dr. Migdalia Dk for consideration of operative debridement 12/20/16; we have a quick note from Dr.  Merri Ray who feels that the patient needs to be referred to an academic center/plastic surgeon. This is due to the complexity of the patient's medical issues as it applies to anything in the OR. We have been using Iodoflex 12/27/16; in general the wounds on the right leg are better in terms of the difficult to remove surface slough. She has been using Iodoflex. She is approved for Apligraf which I anticipated ordering in the next week or 2 when we get a better-looking surface 01/04/16 the deep wounds on the right leg generally look better. Both of them are debrided further surface slough. The area on the lateral right leg was not debrided. She is approved for Apligraf I think I'll probably order this either next week or  the week after depending on the surface of the wounds superiorly. We have been using Iodoflex which will continue until then 01/11/16; the deep wounds on the right leg again have a surface slough that requires debridement. I've not been able to get the wound bed on either one of these wounds down to what would be acceptable for an advanced skin stab- like Apligraf. The area on the medial leg has been improving. We have been using hot Iodoflex to all wounds which seems to do the best at at least limiting the nonviable surface 01/24/17; we have continued Iodoflex and all her wound areas. Her debridement Gen. he is easier and the surface underneath this looks viable. Nevertheless these are large area wounds with exposed muscle at least on the anterior parts. We have ordered Apligraf's for 2 weeks from now. The patient will be away next week 02/07/17; the patient was close to have first Apligraf today however we did not order one. I therefore replaced her Iodoflex. She essentially has 3 large punched-out areas on her right anterior leg and right medial ankle. 02/11/17; Apligraf #1 02/25/17; Apligraf #2. In general some improvement in the right medial ankle and right anterior leg  wounds. The larger wound medially perhaps some better 03/11/17; Apligraf #3. In general continued improvement in the right medial ankle and the right anterior leg wounds. 03/25/17 Apligraf #4. In general continued improvement especially on the right medial ankle and the lower 04/08/17; Apligraf #5 in general continued improvement in all wound sites. 04/22/17; post Apligraf #5 her wound beds continued to look a lot better all of them up to the surface of the surrounding skin. Had a caramel-colored slough that I did not debrided in case there is residual Apligraf effect. The wounds are as good as I have seen these looking quite some time. 04/29/17; we applied [place] and last week after completing her Apligraf. Wounds look as though they've contracted somewhat although they have a nonviable surface which was problematic in the past. Apparently she has been approved for further Apligraf's. We applied Iodosorb today after debridement. 05/06/17; we're fortunate enough today to be able to apply additional Apligraf approved by her insurance. In general all of her wounds look better Apligraf #6 05/24/17; Apligraf #7 continued improvement in all wounds o3 06/10/17 Apligraf #8. Continued improvement in the surface of all wounds. Not much of an improvement in dimensions as I might a follow 06/24/17 Apligraf #9 continued general improvement although not as much change in the wound areas I might of like. She has a new open area on the right anterior lateral ankle very small and superficial. She also has a necrotic wound on the tip of her right index finger probably secondary to severe uncontrolled Raynaud's phenomenon. She is already on sildenafil and already seen her rheumatologist who gave her Keflex. 07/08/17; Apligraf #10. Generalized improvement although she has a small additional wound just medial to the major wound area. 07/22/17; after discussion we decided not to reorder any further Apligraf's although  there is been considerable improvement with these it hasn't been recently. The major wound anteriorly looks better. Smaller wounds beneath this and the more recent one and laterally look about the same. The area on the right lateral lower leg looks about the same 07/29/17 this is a patient who is exceedingly complex. She has advanced scleroderma, crest syndrome including calcinosis, PAD status post revascularization, chronic venous insufficiency status post ablations. She initially presented to this clinic with wounds on her  bilateral lower legs however these closed. More recently we have been dealing with a large open area superiorly on the right anterior leg, a smaller wound underneath this and then another one on the right medial lower extremity. These improve significantly with 10 Apligraf applications. Over the last 2-3 weeks we are making good progress with Hydrofera Blue and these seem to be making progress towards closure 08/19/17; wounds continued to make good improvement with Hydrofera Blue and episodic aggressive debridements 08/26/17; still using Hydrofera Blue. Good improvements 09/09/17; using Hydrofera Blue continued improvement. Area on the lateral part of her right leg has only a small remaining open area. The small inferior satellite region is for all intents and purposes closed. Her major wound also is come in in terms of depth and has advancing epithelialization. 09/16/17; using Hydrofera Blue with continued improvement. The smaller satellite wound. We've closed out today along with a new were satellite wound medially. The area on her medial ankle is still open and her major wound is still open but making improvement. All using Hydrofera Blue. Currently 09/30/17; using Hydrofera Blue. Still a small open area on the lateral right ankle area and her original major wound seems to be making gradual and steady improvement. 10/14/17; still using Hydrofera Blue. Still too small open areas  on the right lateral ankle. Her original major wound is horizontal and linear. The most problematic area paradoxically seems to be the area to the medial area wears I thought it would be the lateral. 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 10/28/17; still using Hydrofera Blue. Right lateral ankle has a very small open area superiorly on the most lateral part of the wound. Her original open wound has 2 open areas now separated by normal skin and we've redefined this. 11/11/17; still using Hydrofera Blue area and right lateral ankle continues to have a small open area on mostly the lateral part of the wound. Her original wound has 2 small open areas now separated by a considerable amount of normal skin 11/28/17; the patient called in slightly before Thanksgiving to report pain and erythema above the wound on the right leg. In the past this is responded well to treatment for cellulitis and I gave her over the phone doxycycline. She stated this resulted in fairly abrupt improvement. We have been using Hydrofera Blue for a prolonged period of time to the larger wound anteriorly into the remaining wound on her right right lateral ankle. The latter is just about closed with only a small linear area and the bottom of the [place]. 12/02/17; use endoform and left the dressing on since last visit. There is no tenderness and no evidence of infection. 12/16/17; patient has been using Endoform but not making much progress. The 2 punched out open areas anteriorly which were the reminiscence of her major wound appear deeper. The area on the lateral aspect of her right calf also appears deeper. Also she has a puzzling tender swelling above her wound on the right leg. This seems larger than last time. Just above her wounds there appears to be some fluctuance in this area it is not erythematous and there is no crepitus 12/30/17; patient has been using Endoform up  until last week we used Hydrofera Blue. Ultrasound of the swelling above her 2 major wounds last time was negative for a fluid collection. I gave her cefaclor for the erythema and tenderness in this area which seems better. Unfortunately both punched  out areas anteriorly and the area on her right medial lower leg appear deeper. In fact the lateral of the wounds anteriorly actually looks as though it has exposed tendon and/or muscle sheath. She is not systemically unwell. She is complaining of vaginitis type symptoms presumably Candida from her antibiotics. 01/06/18; we're using santyl. she has 2 punched out areas anteriorly which were initially part of a large wound. Unfortunately medially this is now open to tendon/muscle. All the wounds have the same adherent very difficult to debride surface. 01/20/18; 2 week follow-up using Santyl. She has the 2 punched out areas anteriorly which were initially part of her large surface wound there. Medially this still has exposed muscle. All of these have the same tightly adherent necrotic surface which requires debridement. PuraPly was not accepted by the patient's insurance however her insurance I think it changed therefore we are going to run Apligraf to gain 02/03/18; the patient has been using Santyl. The wound on the right lateral ankle looks improved and the 2 areas anteriorly on the right leg looks about the same. The medial one has exposed muscle. The lateral 1 requiresdebridement. We use PuraPly today for the first week 02/10/18; PuraPly #2. The patient has 3 wounds. The area on the right lateral ankle, 2 areas anteriorly that were part of her original large wound in this area the medial one has exposed muscle. All of the wounds were lightly debrided with a number 3 curet. PuraPly #2 applied the lateral wound on the calf and the right lateral ankle look better. 02/17/18; PuraPly #3. Patient has 3 wounds. The area on the right lateral ankle in 2 areas  internally that were part of her original large wound. The lateral area has exposed muscle. She arrived with some complaints of pain around the right ankle. 02/24/18; PuraPly #4; not much change in any of the 3 wound areas. Right lateral ankle, right lateral calf. Both of these required debridement with a #3 curet. She tolerates this marginally. The area on the medial leg still has exposed muscle. Not much change in dimensions 03/03/18 PuraPly #5. The area on the medial ankle actually looks better however the 2 separate areas that were original parts of the larger right anterior leg wound look as though they're attempting to coalesce. 03/10/18; PuraPly #6. The area on the medial ankle actually continues to look and measures smaller however the 2 separate areas that were part of the original large wound on the right anterior leg have now coalesced. There hasn't been much improvement here. The lateral area actually has underlying exposed muscle 03/17/18-she is here in follow-up evaluation for ulcerations to the right lower extremity. She is voicing no complaints or concerns. She tolerated debridement. Puraply#7 placed 03/24/18; difficult right lower extremity ulcerations. PuraPly #8 place. She is been approved for [employer]. She did very well with Apligraf today however she is apparently reached her "lifetime max" 03/31/18; marginal improvement with PuraPly although her wounds looked as good as they have in several weeks today. Used TheraSkins #1 04/14/18 TheraSkin #2 today 04/28/18 TheraSkins #3. Wound slightly improved 05/12/18; TheraSkin skin #4. Wound response has been variable. 05/27/18 TheraSkin #5. Generally improvement in all wound areas. I've also put her in 3 layer compression to help with the severe venous hypertension 06/09/18; patient is done quite well with the TheraSkins unfortunately we have no further applications. I also put her in 3 layer compression last week and that really seems to of  helped. 06/16/18; we have been using  silver collagen. Wounds are smaller. Still be open area to the muscle layer of her calf however even that is contracted somewhat. She tells me that at night sometimes she has pain on the right lateral calf at the site of her lower wound. Notable that I put her into 3 layer compression about 3 weeks ago. She states that she dangles her leg over the bed that makes it feel better but she does not describe claudication during the day ooShe is going to call her secondary insurance to see if they will continue to cover advanced treatment products I have reviewed her arterial studies from 01/22/17; this showed an ABI in the right of 1 and on the left noncompressible. TBI on the right at 0.30 on the left at 0.34. It is therefore possible she has significant PAD with medial calcification falsely elevating her ABI into the normal range. I'll need to be careful about asking her about this next week it's possible the 3 layer compression is too much 06/23/18; was able to reapply TheraSkin 1 today. Edema control is good and she is not complaining of pain no claudication 07/07/18;no major change. New wound which was apparently a taper removal injury today in our clinic between her 2 wounds on the right calf TheraSkins #2 07/14/18; I think there is some improvement in the right lateral ankle and the medial part of her wound. There is still exposed muscle medially. 07/28/18; two-week follow-up. TheraSkins#3. Unfortunately no major change. She is not a candidate I don't think for skin grafting due to severe venous hypertension associated with her scleroderma and pulmonary hypertension 08/11/18 Patient is here today for her Theraskin application #00 (#5 of the second set). She seems to be doing well and in the base of the wound appears to show some progress at this point. This is the last approved Theraskin of the second set. 08/25/18; she has completed TheraSkin. There has been  some improvement on the right lateral calf wound as well as the anterior leg wounds. The open area to muscle medially on the anterior leg wound is smaller. I'm going to transition her back to [facility] under Kerlix Coban change every second day. She reports that she had some calcification removed from her right upper arm. We have had previous problems with calcifications in her wounds on her legs but that has not happened recently 09/08/18;using Hydrofera Blue on both her wound areas. Wounds seem to of contracted nicely. She uses Kerlix Coban wrap and changes at home herself 09/22/2018; using Hydrofera Blue on both her wound areas. Dimensions seem to have come down somewhat. There is certainly less depth in the medial part of the mid tibia wound and I do not think there is any exposed muscle at this point. 10/06/2018; 2-week follow-up. Using [facility] on her wound areas. Dimensions have come down nicely both on the right lateral ankle area in the right mid tibial area. She has no new complaints 10/20/2018; 2-week follow-up. She is using Hydrofera Blue. Not much change from the last time she was here. The area on the lateral ankle has less depth although it has raised edges on one side. I attempted to remove as much of the raised edge as I could without creating more additional wounds. The area on the right anterior mid tibia area looks the same. 11/03/2018; 2-week follow-up she is using Hydrofera Blue. On the right anterior leg she now has 2 wounds separated by a large area of normal skin. The area  on the medial part still has I think exposed muscle although this area itself is a lot smaller. The area laterally has some depth. Both areas with necrotic debris. ooThe area on her right lateral ankle has come in nicely 11/17/2018; patient continues to use Hydrofera Blue. We have been increasing separation of the 2 wounds anteriorly which were at one point joint the area on the right lateral  calf continues to have I think some improvement in depth. 12/08/2018; patient continues to use Hydrofera Blue. There is some improvement in the area on the right lateral calf. The 2 areas that were initially part of the original large wound in the mid right tibia are probably about the same. In fact the medial area is probably somewhat larger. We will run puraply through the patient's insurance 12/22/2018; she has been using Hydrofera Blue. We have small wounds on the right lateral calf and 2 small areas that were initially part of a large wound in the right mid tibia. We applied pure apply #1 today. 12/29/2018; we applied puraply #2. Her wounds look somewhat better especially on the right lateral calf and the lateral part of her original wound in the mid tibial area. 01/05/2019; perhaps slightly improved in terms of wound bed condition but certainly not as much improvement as I might of liked. Puraply #3 1/13: we did not have a correct sized puraply to apply. wounds more pinched out looking, I increased her compression to 3 layer last week to help with significant multilevel venous hypertension. Since then I've reviewed her arterial status. She has a right femoral endarterectomy and a distal left SFA stent. She was being followed by Dr. [name] for a period however she does not appear to have seen him in 3 years. I will set up an appointment. 1/20. The patient has an additional wound on the right lateral calf between the distal wound and proximal wounds. We did not have Puraply last week. Still does not have a follow-up with Dr. early 1/27: Follow-up with Dr. early has been arranged apparently with follow-up noninvasive studies. Wounds are measuring roughly the same although they certainly look smaller 2/3; the patient had non invasive studies. Her ABIs on the right were 0.83 and on the left 1.02 however there was no great toe pressure bilaterally. Also worrisome monophasic waveforms at the PTA and  dorsalis pedis. We are still using Puraply. We have had some improvement in all of the wounds especially the lateral part of the mid tibial area. 2/10; sees Dr. early of vein and vascular re-arterial studies next week. Puraply reapplied today. 2/17; Dr. early of vein and vascular his appointment is tomorrow. Puraply reapplied after debridement of all wounds 2/24; the patient saw Dr. early I reviewed his note. sHe noted the previous right femoral endarterectomy with a Dacron patch. He also noted the normal ABI and the monophasic waveforms suggesting tibial disease. Overall he did not feel that she had any evidence of arterial insufficiency that would impair her wound healing. He did note her venous disease as well. He suggested PRN follow-up. 3/2; I had the last puraply applied today. The original wounds over the mid tibia area are improved where is the area on the right lower leg is not 3/9; wounds are smaller especially in the right mid tibia perhaps slightly in the right lateral calf. We finished with puraply and went to endoform today 3/23; the patient arrives after 2 weeks. She has been using endoform. I think all of her wounds  look slightly better which includes the area on the right lateral calf just above the right lateral malleolus and the 2 in the right mid tibia which were initially part of the same wound. 4/27; TELEHEALTH visit; the patient was seen for telehealth visit today with her consent in the middle of the worldwide epidemic. Since she was last here she called in for antibiotics with pain and tenderness around the area on the right medial ankle. I gave her empiric doxycycline. She states this feels better. She is using endoform on both of these areas 5/11 TELEHEALTH; the patient was seen for telehealth visit today. She was accompanied at home by her husband. She has severe pulmonary hypertension accompanied scleroderma and in the face of the Covid epidemic cannot be safely  brought into our clinic. We have been using endoform on her wound areas. There are essentially 3 wound areas now in the left mid tibia now 2 open areas that it one point were connected and one on the right lateral ankle just above the malleolus. 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 She cannot come to clinic. The wounds were reviewed today via telehealth. She has severe chronic venous hypertension which I think is centrally mediated. She has wounds on her right anterior tibia and right lateral ankle area. These are chronic. She has been using endoform. 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 We have been following her from telehealth. She has severe chronic venous hypertension which may be mostly centrally mediated secondary to right heart heart failure. She has wounds on her right anterior tibia and right lateral ankle these are chronic we have been using endoform 6/22; TELEHEALTH; this patient was seen today via telehealth. 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 Since the last time we had contact with this patient she developed some pain and erythema around the wound on her right lateral malleolus/ankle and we put in antibiotics for her. This is resulted in good improvement with resolution of the erythema and tenderness. I changed her to silver alginate last time. We had been using endoform for an extended period of time 7/13; TELEHEALTH; this patient was seen today via telehealth. 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 She would continue to be at  a prohibitive risk to be brought into our clinic unless this was absolutely necessary. These visits have been done with her approval as well as her husband. We have been using silver alginate to the areas on the right mid tibia and right lateral lower leg. 7/27 TELEHEALTH; patient was seen along with her husband today via telehealth. 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 We changed her to sample last visit. She has 2 areas a chronic wound on her right mid tibia and one just above her ankle. These were not the original wounds when she came into this clinic but she developed them during treatment 8/17; she comes in for her first face-to-face visit in a long period. She has a remaining area just medial to the right  tibia which is the last open part of her large wound across this area. She also has an area on the right lateral lower leg. We prescribed Santyl last telehealth visit but they were concerned that this was making a deeper so they put silver alginate on it last week. Her husband changes the dressings. 8/31; using Santyl to the 2 wound areas some improvement in wound surfaces. Husband has surgery in 2 weeks we will put her out 3 weeks. Any of the advanced treatment options that I can think of that would be eligible for this wound would also cause her to have to come in weekly. The risk that the patient is just too high 9/21. Using Santyl to the 2 wound areas. Both of these are somewhat better although the medial mid tibia area still has exposed muscle. Lateral ankle requiring debridement. Using Santyl 10/12; using Santyl to the 2 wound areas. One on the right lateral ankle and the other in the medial calf which still has exposed muscle. Both areas have come down in size and have better looking surfaces. She has made nice progress with santyl 11/2; we have been using Santyl to the 2 wound areas.  Right lateral ankle and the other in the mid tibia area the medial part of this still has exposed muscle. Objective Constitutional Sitting or standing Blood Pressure is within target range for patient.. Pulse regular and within target range for patient.Marland Kitchen Respirations regular, non-labored and within target range.. Temperature is normal and within the target range for the patient.Marland Kitchen Appears in no distress. Vitals Time Taken: 9:02 AM, Height: 68 in, Weight: 132 lbs, BMI: 20.1, Temperature: 97.7 F, Pulse: 79 bpm, Respiratory Rate: 18 breaths/min, Blood Pressure: 131/53 mmHg. General Notes: Wound exam; right mid tibia area. Lateral part of this is reopened very tiny open area medial areas appears smaller. Both areas debrided by a #3 curette. ooRight lateral ankle. Debrided with a #3 curette. Wound is clean. ooNo evidence of surrounding infection Integumentary (Hair, Skin) Wound #14 status is Open. Original cause of wound was Gradually Appeared. The wound is located on the Right,Medial,Anterior Lower Leg. The wound measures 0.2cm length x 0.3cm width x 0.2cm depth; 0.047cm^2 area and 0.009cm^3 volume. There is Fat Layer (Subcutaneous Tissue) Exposed exposed. There is no tunneling or undermining noted. There is a medium amount of serosanguineous drainage noted. The wound margin is distinct with the outline attached to the wound base. There is small (1-33%) pink, pale granulation within the wound bed. There is a large (67-100%) amount of necrotic tissue within the wound bed including Adherent Slough. Wound #5 status is Open. Original cause of wound was Gradually Appeared. The wound is located on the Right,Medial Lower Leg. The wound measures 1.2cm length x 1.6cm width x 0.9cm depth; 1.508cm^2 area and 1.357cm^3 volume. There is muscle and Fat Layer (Subcutaneous Tissue) Exposed exposed. There is no tunneling or undermining noted. There is a medium amount of serosanguineous drainage noted. The wound  margin is distinct with the outline attached to the wound base. There is small (1-33%) red, pink granulation within the wound bed. There is a large (67-100%) amount of necrotic tissue within the wound bed including Adherent Slough and Necrosis of Muscle. Wound #7 status is Open. Original cause of wound was Gradually Appeared. The wound is located on the Right,Lateral Malleolus. The wound measures 1cm length x 0.6cm width x 0.3cm depth; 0.471cm^2 area and 0.141cm^3 volume. There is Fat Layer (Subcutaneous Tissue) Exposed exposed. There is no tunneling  or undermining noted. There is a medium amount of serosanguineous drainage noted. The wound margin is distinct with the outline attached to the wound base. There is no granulation within the wound bed. There is a large (67- 100%) amount of necrotic tissue within the wound bed including Adherent Slough. 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 There was a Excisional Skin/Subcutaneous Tissue Debridement with a total area of 1.92 sq cm performed by Ricard Dillon., MD. With the following instrument(s): Curette to remove Viable and Non-Viable tissue/material. Material removed includes Subcutaneous Tissue and Slough and after achieving pain control using Other (BENZOCAINE 20% spray). No specimens were taken. A time out was conducted at 09:24, prior to the start of the procedure. A Minimum amount of bleeding was controlled with Pressure. The procedure was  tolerated well with a pain level of 0 throughout and a pain level of 0 following the procedure. Post Debridement Measurements: 1.2cm length x 1.6cm width x 0.9cm depth; 1.357cm^3 volume. Character of Wound/Ulcer Post Debridement is improved. Severity of Tissue Post Debridement is: Fat layer exposed. Post procedure Diagnosis Wound #5: Same as Pre-Procedure Wound #7 Pre-procedure diagnosis of Wound #7 is a Venous Leg Ulcer located on the Right,Lateral Malleolus .Severity of Tissue Pre Debridement is: Fat layer exposed. There was a Excisional Skin/Subcutaneous Tissue Debridement with a total area of 0.6 sq cm performed by Ricard Dillon., MD. With the following instrument(s): Curette to remove Viable and Non-Viable tissue/material. Material removed includes Subcutaneous Tissue and Slough and after achieving pain control using Other (BENZOCAINE 20% spray). No specimens were taken. A time out was conducted at 09:24, prior to the start of the procedure. A Minimum amount of bleeding was controlled with Pressure. The procedure was tolerated well with a pain level of 0 throughout and a pain level of 0 following the procedure. Post Debridement Measurements: 1cm length x 0.6cm width x 0.3cm depth; 0.141cm^3 volume. Character of Wound/Ulcer Post Debridement is improved. Severity of Tissue Post Debridement is: Fat layer exposed. Post procedure Diagnosis Wound #7: Same as Pre-Procedure Plan Follow-up Appointments: Return appointment in 3 weeks. Dressing Change Frequency: Change dressing every day. - all wounds Skin Barriers/Peri-Wound Care: Moisturizing lotion - both legs Wound Cleansing: May shower and wash wound with soap and water. Primary Wound Dressing: Wound #14 Right,Medial,Anterior Lower Leg: Collagen - apply over santyl Santyl Ointment Wound #5 Right,Medial Lower Leg: Collagen - apply over santyl Santyl Ointment Wound #7 Right,Lateral Malleolus: Collagen - apply over santyl Santyl  Ointment Secondary Dressing: Wound #14 Right,Medial,Anterior Lower Leg: Dry Gauze Wound #5 Right,Medial Lower Leg: Dry Gauze Wound #7 Right,Lateral Malleolus: Dry Gauze Edema Control: Kerlix and Coban - Right Lower Extremity Avoid standing for long periods of time Elevate legs to the level of the heart or above for 30 minutes daily and/or when sitting, a frequency of: - throughout the day 1. Santyl ointment over the wounds with clean collagen over the top. Electronic Signature(s) Signed: 11/02/2019 5:57:33 PM By: Linton Ham MD Entered By: Linton Ham on 11/02/2019 09:57:02 -------------------------------------------------------------------------------- SuperBill Details Patient Name: Date  of Service: TAZIA, ILLESCAS 11/02/2019 Medical Record Number:[id number] Patient Account Number: [id number] Date of Birth/Sex: Treating RN: 1951/09/25 (68 y.o. 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

## 2019-11-08 ENCOUNTER — Encounter (HOSPITAL_BASED_OUTPATIENT_CLINIC_OR_DEPARTMENT_OTHER): Payer: Self-pay | Admitting: Emergency Medicine

## 2019-11-08 ENCOUNTER — Emergency Department (HOSPITAL_BASED_OUTPATIENT_CLINIC_OR_DEPARTMENT_OTHER): Payer: Medicare Other

## 2019-11-08 ENCOUNTER — Emergency Department (HOSPITAL_BASED_OUTPATIENT_CLINIC_OR_DEPARTMENT_OTHER)
Admission: EM | Admit: 2019-11-08 | Discharge: 2019-11-08 | Disposition: A | Discharge status: Home / Self Care | Payer: Medicare Other | Attending: Emergency Medicine | Admitting: Emergency Medicine

## 2019-11-08 ENCOUNTER — Other Ambulatory Visit: Payer: Self-pay

## 2019-11-08 DIAGNOSIS — I129 Hypertensive chronic kidney disease with stage 1 through stage 4 chronic kidney disease, or unspecified chronic kidney disease: Secondary | ICD-10-CM | POA: Insufficient documentation

## 2019-11-08 DIAGNOSIS — R112 Nausea with vomiting, unspecified: Secondary | ICD-10-CM | POA: Insufficient documentation

## 2019-11-08 DIAGNOSIS — R1110 Vomiting, unspecified: Secondary | ICD-10-CM | POA: Diagnosis not present

## 2019-11-08 DIAGNOSIS — E039 Hypothyroidism, unspecified: Secondary | ICD-10-CM | POA: Insufficient documentation

## 2019-11-08 DIAGNOSIS — Z7902 Long term (current) use of antithrombotics/antiplatelets: Secondary | ICD-10-CM | POA: Diagnosis not present

## 2019-11-08 DIAGNOSIS — Z79899 Other long term (current) drug therapy: Secondary | ICD-10-CM | POA: Diagnosis not present

## 2019-11-08 DIAGNOSIS — E876 Hypokalemia: Secondary | ICD-10-CM | POA: Diagnosis not present

## 2019-11-08 DIAGNOSIS — N183 Chronic kidney disease, stage 3 unspecified: Secondary | ICD-10-CM | POA: Insufficient documentation

## 2019-11-08 LAB — COMPREHENSIVE METABOLIC PANEL WITH GFR
ALT: 16 U/L (ref 0–44)
AST: 32 U/L (ref 15–41)
Albumin: 3.6 g/dL (ref 3.5–5.0)
Alkaline Phosphatase: 64 U/L (ref 38–126)
Anion gap: 15 (ref 5–15)
BUN: 17 mg/dL (ref 8–23)
CO2: 21 mmol/L — ABNORMAL LOW (ref 22–32)
Calcium: 7.2 mg/dL — ABNORMAL LOW (ref 8.9–10.3)
Chloride: 101 mmol/L (ref 98–111)
Creatinine, Ser: 0.72 mg/dL (ref 0.44–1.00)
GFR calc Af Amer: >60 mL/min (ref >60)
GFR calc non Af Amer: >60 mL/min (ref >60)
Glucose, Bld: 92 mg/dL (ref 70–99)
Potassium: 3.1 mmol/L — ABNORMAL LOW (ref 3.5–5.1)
Sodium: 137 mmol/L (ref 135–145)
Total Bilirubin: 0.7 mg/dL (ref 0.3–1.2)
Total Protein: 7.3 g/dL (ref 6.5–8.1)

## 2019-11-08 LAB — CBC
HCT: 36.5 % (ref 36.0–46.0)
Hemoglobin: 11.4 g/dL — ABNORMAL LOW (ref 12.0–15.0)
MCH: 33.1 pg (ref 26.0–34.0)
MCHC: 31.2 g/dL (ref 30.0–36.0)
MCV: 106.1 fL — ABNORMAL HIGH (ref 80.0–100.0)
Platelets: 266 K/uL (ref 150–400)
RBC: 3.44 MIL/uL — ABNORMAL LOW (ref 3.87–5.11)
RDW: 13.6 % (ref 11.5–15.5)
WBC: 7.7 K/uL (ref 4.0–10.5)
nRBC: 0 % (ref 0.0–0.2)

## 2019-11-08 LAB — URINALYSIS, ROUTINE W REFLEX MICROSCOPIC
Bilirubin Urine: NEGATIVE
Glucose, UA: NEGATIVE mg/dL
Hgb urine dipstick: NEGATIVE
Ketones, ur: NEGATIVE mg/dL
Leukocytes,Ua: NEGATIVE
Nitrite: NEGATIVE
Protein, ur: NEGATIVE mg/dL
Specific Gravity, Urine: <1.005 — ABNORMAL LOW (ref 1.005–1.030)
pH: 6 (ref 5.0–8.0)

## 2019-11-08 LAB — LIPASE, BLOOD: Lipase: 109 U/L — ABNORMAL HIGH (ref 11–51)

## 2019-11-08 MED ORDER — ONDANSETRON 4 MG PO TBDP: 4.0000 mg | ORAL_TABLET | Freq: Three times a day (TID) | ORAL | 0 refills | Status: DC | PRN

## 2019-11-08 MED ORDER — POTASSIUM CHLORIDE 20 MEQ/15ML (10%) PO SOLN
20.0000 meq | Freq: Once | ORAL | Status: AC
  Administered 2019-11-08: 20 meq via ORAL
  Filled 2019-11-08: qty 15

## 2019-11-08 MED ORDER — IOHEXOL 300 MG/ML  SOLN
100.0000 mL | Freq: Once | INTRAMUSCULAR | Status: AC | PRN
  Administered 2019-11-08: 80 mL via INTRAVENOUS

## 2019-11-08 NOTE — ED Provider Notes (Signed)
Patient care assumed at 1500.  Patient with history of scleroderma, oxygen dependence here for evaluation of nausea and vomiting. She is non-toxic appearing on evaluation. She has not had any recurrent emesis or nausea following antiemetics in the emergency department. Labs with mild hypokalemia - will replace orally. UA is not consistent with UTI. CT scan demonstrates cholelithiasis, no evidence of cholecystitis. Patient without active abdominal pain or tenderness. Counseled patient and husband on home care for nausea, vomiting. Discussed outpatient follow-up as well as return precautions.   Quintella Reichert, MD 11/08/19 1721

## 2019-11-08 NOTE — ED Provider Notes (Signed)
[facility] EMERGENCY DEPARTMENT Provider Note   CSN: [id number] Arrival date & time: 11/08/19  1245     History   Chief Complaint Chief Complaint  Patient presents with   Emesis    HPI [name] CAFFEY is a 68 y.o. female.     HPI   68 year old female with nausea and vomiting.  Symptom onset Friday.  Persistent since then.  She denies any abdominal pain.  She has been able to keep down a small amount of fluids today but no solid food.  No diarrhea.  No urinary complaints.  No sick contacts that she is aware of.  Denies any prior abdominal surgery.  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[facility])     Patient Active Problem List   Diagnosis Date Noted   Osteoporosis 01/22/2019   Hypothyroidism 06/16/2018   Pulmonary hypertension associated with systemic disorder ([place]) 10/08/2017   Dry gangrene ([place]) 09/11/2017   Medication reaction 09/11/2017   ILD (interstitial lung disease) ([place]) 07/22/2017   Chronic respiratory failure ([place]) 10/31/2016   Atherosclerosis of native arteries of right leg with ulceration of unspecified site ([place]) 09/14/2016   Prolonged Q-T interval on ECG 07/29/2016   CKD (chronic kidney disease), stage III 07/29/2016   Scleroderma ([place]) 05/06/2016   Depression 04/24/2016   Hypokalemia 10/07/2015   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[place]) 09/01/2015   Allergic rhinitis 03/08/2015   Varicose veins of  lower extremities with ulcer and inflammation ([place]) 07/24/2012   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[place]) 04/08/2012   Pulmonary hypertension associated with systemic disorder ([place]) 03/25/2012   Peripheral arterial occlusive disease ([place]) 08/10/2011   Essential hypertension 08/10/2011   Hyperlipidemia 08/10/2011    Past Surgical History:  Procedure Laterality Date   AMPUTATION Right 10/24/2017   Procedure: RAY resection right index;  Surgeon: Daryll Brod, MD;  Location: [facility];  Service: Orthopedics;  Laterality: Right;  Axillary block   AMPUTATION Right 11/21/2018   Procedure: AMPUTATION RAY RIGHT MIDDLE FINGER;  Surgeon: Daryll Brod, MD;  Location: [facility];  Service: Orthopedics;  Laterality: Right;   AMPUTATION Right 10/22/2019   Procedure: AMPUTATION DIGIT RIGHT RING AND SMALL FINGER, DIGITAL SYMPATHECTOMY RIGHT RADIAL ULNAR ARCH;  Surgeon: Daryll Brod, MD;  Location: [facility];  Service: Orthopedics;  Laterality: Right;  AXILARY   apligraph  12-04-2102,   12-18-2012   bilateral calves   [name]   biopsy on cerebellum     COLONOSCOPY WITH PROPOFOL Left 10/07/2015   Procedure: COLONOSCOPY WITH PROPOFOL;  Surgeon: [name], MD;  Location: [facility];  Service: Endoscopy;  Laterality: Left;   ENDARTERECTOMY FEMORAL Right 09/14/2016   Procedure: ENDARTERECTOMY FEMORAL RIGHT;  Surgeon: [name], MD;  Location: [facility];  Service: Vascular;  Laterality: Right;   ENDOVENOUS ABLATION SAPHENOUS VEIN W/ LASER  07-24-2012  right greater saphenous vein by [name], M.D.    ENDOVENOUS ABLATION SAPHENOUS VEIN W/ LASER  10-16-2012   left greater saphenous vein  by Dr. [name]   ENDOVENOUS ABLATION SAPHENOUS VEIN W/ LASER Right 09-01-2015   endovenous laser ablation right greater saphenous vein by [name] MD   ESOPHAGOGASTRODUODENOSCOPY N/A 05/07/2016   Procedure: ESOPHAGOGASTRODUODENOSCOPY (EGD);  Surgeon: [name], MD;  Location: [facility] ENDOSCOPY;   Service: Endoscopy;  Laterality: N/A;   ESOPHAGOGASTRODUODENOSCOPY (EGD) WITH PROPOFOL Left 10/07/2015   Procedure: ESOPHAGOGASTRODUODENOSCOPY (EGD) WITH PROPOFOL;  Surgeon: [name], MD;  Location: [facility];  Service: Endoscopy;  Laterality: Left;   GIVENS CAPSULE STUDY N/A 07/31/2016   Procedure: GIVENS CAPSULE STUDY;  Surgeon: [name], MD;  Location: [facility];  Service: Endoscopy;  Laterality: N/A;   melonoma removes Right    behind right knee   PATCH ANGIOPLASTY Right 09/14/2016   Procedure: PATCH ANGIOPLASTY RIGHT FEMORAL ARTERY USING HEMASHIELD PLATINUM FINESSE PATCH;  Surgeon: [name], MD;  Location: [facility];  Service: Vascular;  Laterality: Right;   PERIPHERAL VASCULAR CATHETERIZATION N/A 09/07/2016   Procedure: Abdominal Aortogram w/Lower Extremity;  Surgeon: [name], MD;  Location: [facility] CV LAB;  Service: Cardiovascular;  Laterality: N/A;   RIGHT HEART CATH N/A 04/19/2017   Procedure: Right Heart Cath;  Surgeon: [name], MD;  Location: [facility] CV LAB;  Service: Cardiovascular;  Laterality: N/A;   RIGHT HEART CATHETERIZATION N/A 11/14/2012   Procedure: RIGHT HEART CATH;  Surgeon: [name], MD;  Location: [facility] CATH LAB;  Service: Cardiovascular;  Laterality: N/A;   rt little toe removal  10/1998   stent left thigh  3/11   TENDON TRANSFER Right 10/24/2017   Procedure: transfer extensor indecus propius/extensor digitorum commomus index to middle ring and small;  Surgeon: Daryll Brod, MD;  Location: [facility];  Service: Orthopedics;  Laterality: Right;   TONSILLECTOMY     TONSILLECTOMY AND ADENOIDECTOMY     TUNNELED VENOUS CATHETER PLACEMENT  01/2013   ULNAR HEAD EXCISION Right 10/24/2017   Procedure: darrach right ulna;  Surgeon: Daryll Brod, MD;  Location: [facility];  Service: Orthopedics;  Laterality: Right;   VEIN LIGATION AND STRIPPING  05/16/2012   Procedure: VEIN LIGATION AND STRIPPING;  Surgeon: [name], MD;  Location:  [facility] OR;  Service: Vascular;  Laterality: Right;  Irrigation and Debridement right lower leg, ligation of saphenous vein.     OB History   No obstetric history on file.      Home Medications    Prior to Admission medications   Medication Sig Start Date End Date Taking? Authorizing Provider  acetaminophen (TYLENOL ARTHRITIS PAIN) 650 MG CR tablet Take 1,300 mg by mouth every 8 (eight) hours.     [provider]  atorvastatin (LIPITOR) 20 MG tablet Take 1 tablet (20 mg total) by mouth daily. 06/18/19   [name], MD  Biotin 5000 MCG TABS Take 5,000 mcg by mouth daily.    [provider]  CALCIUM-MAGNESIUM-VITAMIN D PO Take 2 tablets by mouth daily.    [provider]  Cholecalciferol (VITAMIN D) 2000 units CAPS Take 2,000 Units by mouth daily.    [provider]  citalopram (CELEXA) 20 MG tablet TAKE 1 TABLET BY MOUTH EVERYDAY AT BEDTIME Patient taking differently: Take 20 mg by mouth daily.  07/11/19   [name], MD  clopidogrel (PLAVIX) 75 MG tablet TAKE 1 TABLET BY MOUTH EVERY DAY 03/11/19  Larey Dresser, MD  FERREX 150 150 MG capsule TAKE ONE CAPSULE BY MOUTH TWICE A DAY 01/26/19   [name], MD  fish oil-omega-3 fatty acids 1000 MG capsule Take 1,000 mg by mouth 2 (two) times daily.     [provider]  gabapentin (NEURONTIN) 300 MG capsule Take 600 mg by mouth 2 (two) times daily.    [provider]  HYDROcodone-acetaminophen (NORCO) 5-325 MG tablet Take 1 tablet by mouth every 6 (six) hours as needed for moderate pain. 10/22/19   Daryll Brod, MD  levothyroxine (SYNTHROID) 50 MCG tablet TAKE 1 TABLET BY MOUTH DAILY BEFORE BREAKFAST Patient taking differently: Take 50 mcg by mouth daily before breakfast.  09/09/19   [name], MD  Multiple Vitamin (MULTIVITAMIN WITH MINERALS) TABS tablet Take 1 tablet by mouth daily.    [provider]  NIFEdipine (ADALAT CC) 30 MG 24 hr tablet Take 30 mg by mouth daily.      [provider]  OPSUMIT 10 MG tablet Take 1 tablet (19m) by mouth daily. 04/10/19   Bensimhon, DShaune Pascal MD  OXYGEN Inhale 2-3 L into the lungs continuous.     [provider]  pantoprazole (PROTONIX) 40 MG tablet Take 1 tablet (40 mg total) by mouth 2 (two) times daily. 05/08/16   DRobbie Lis MD  Probiotic CAPS Take 1 capsule by mouth daily.    [provider]  RESTASIS 0.05 % ophthalmic emulsion Place 1 drop into both eyes 2 (two) times daily. 08/28/19   [provider]  sildenafil (REVATIO) 20 MG tablet Take 3 tablets (60 mg total) by mouth 3 (three) times daily. 10/28/19   Bensimhon, DShaune Pascal MD  spironolactone (ALDACTONE) 25 MG tablet TAKE 1 TABLET BY MOUTH EVERY DAY 08/17/19   Bensimhon, DShaune Pascal MD  torsemide (DEMADEX) 20 MG tablet Take 48min the AM and 2022mn the PM 10/05/19   Bensimhon, DanShaune PascalD  valACYclovir (VALTREX) 500 MG tablet Take 1 tablet (500 mg total) by mouth 2 (two) times daily. Patient taking differently: Take 500 mg by mouth 2 (two) times daily as needed (cold sores).  09/02/19   BaxElby ShowersD  vitamin B-12 (CYANOCOBALAMIN) 1000 MCG tablet Take 1,000 mcg by mouth daily.    [provider]    Family History Family History  Problem Relation Age of Onset   Lupus Father    Lymphoma Father    Hyperlipidemia Mother    Cancer Daughter        sarcoma   Colon cancer Other     Social History Social History   Tobacco Use   Smoking status: Never Smoker   Smokeless tobacco: Never Used  Substance Use Topics   Alcohol use: Yes    Alcohol/week: 0.0 standard drinks    Comment: 1-2 drinks per day wine.liquor or beer   Drug use: No     Allergies   Levofloxacin, Sulfa antibiotics, Levofloxacin in d5w, Doxycycline hyclate, and Penicillins   Review of Systems Review of Systems  All systems reviewed and negative, other than as noted in HPI.  Physical Exam Updated Vital Signs BP (!) 168/56 (BP Location:  Right Arm)    Pulse (!) 58    Temp 99.4 F (37.4 C) (Oral)    Resp 20    Ht _0  (1.727 m)    Wt 61.2 kg    SpO2 100%    BMI 20.53 kg/m   Physical Exam Vitals  signs and nursing note reviewed.  Constitutional:      General: She is not in acute distress.    Appearance: She is well-developed.  HENT:     Head: Normocephalic and atraumatic.  Eyes:     General:        Right eye: No discharge.        Left eye: No discharge.     Conjunctiva/sclera: Conjunctivae normal.  Neck:     Musculoskeletal: Neck supple.  Cardiovascular:     Rate and Rhythm: Normal rate and regular rhythm.     Heart sounds: Normal heart sounds. No murmur. No friction rub. No gallop.   Pulmonary:     Effort: Pulmonary effort is normal. No respiratory distress.     Breath sounds: Normal breath sounds.  Abdominal:     General: There is no distension.     Palpations: Abdomen is soft.     Tenderness: There is no abdominal tenderness.  Musculoskeletal:        General: No tenderness.     Comments: Stigmata of scleroderma bilateral hands.  Skin:    General: Skin is warm and dry.  Neurological:     Mental Status: She is alert.  Psychiatric:        Behavior: Behavior normal.        Thought Content: Thought content normal.      ED Treatments / Results  Labs (all labs ordered are listed, but only abnormal results are displayed) Labs Reviewed  LIPASE, BLOOD - Abnormal; Notable for the following components:      Result Value   Lipase 109 (*)    All other components within normal limits  COMPREHENSIVE METABOLIC PANEL - Abnormal; Notable for the following components:   Potassium 3.1 (*)    CO2 21 (*)    Calcium 7.2 (*)    All other components within normal limits  CBC - Abnormal; Notable for the following components:   RBC 3.44 (*)    Hemoglobin 11.4 (*)    MCV 106.1 (*)    All other components within normal limits  URINALYSIS, ROUTINE W REFLEX MICROSCOPIC    EKG None  Radiology Ct Abdomen Pelvis W  Contrast  Result Date: 11/08/2019 CLINICAL DATA:  Nausea/vomiting, scleroderma EXAM: CT ABDOMEN AND PELVIS WITH CONTRAST TECHNIQUE: Multidetector CT imaging of the abdomen and pelvis was performed using the standard protocol following bolus administration of intravenous contrast. CONTRAST:  80m OMNIPAQUE IOHEXOL 300 MG/ML  SOLN COMPARISON:  Partial comparison to CT chest dated 01/21/2019 FINDINGS: Lower chest: Faint ground-glass opacity with scattered small bilateral pulmonary nodules in the visualized lungs, measuring up to 7 mm in the right middle lobe, unchanged. Mildly dilated, fluid filled distal esophagus, likely related to the patient's known scleroderma. Atherosclerotic calcifications of the aortic root. Three vessel coronary atherosclerosis. Hepatobiliary: Liver is within normal limits. Cholelithiasis, without associated inflammatory changes. No intrahepatic or extrahepatic ductal dilatation. Pancreas: Within normal limits. Spleen: Within normal limits. Adrenals/Urinary Tract: Adrenal glands are within normal limits. Kidneys are within normal limits.  No hydronephrosis. Bladder is within normal limits. Stomach/Bowel: Stomach is within normal limits. No evidence of bowel obstruction. Appendix is not discretely visualized. Left colon is decompressed. Vascular/Lymphatic: No evidence of abdominal aortic aneurysm. Atherosclerotic calcifications of the abdominal aorta and branch vessels. No suspicious abdominopelvic lymphadenopathy. Reproductive: Uterus is within normal limits. Bilateral ovaries are unremarkable. Other: Trace pelvic ascites. Musculoskeletal: Visualized osseous structures are within normal limits. IMPRESSION: No evidence of bowel obstruction. Cholelithiasis, without associated  inflammatory changes. Sequela of scleroderma and associated chronic lung disease involving the lower chest, unchanged. Electronically Signed   By: Julian Hy M.D.   On: 11/08/2019 15:20    Procedures Procedures  (including critical care time)  Medications Ordered in ED Medications  iohexol (OMNIPAQUE) 300 MG/ML solution 100 mL (80 mLs Intravenous Contrast Given 11/08/19 1428)     Initial Impression / Assessment and Plan / ED Course  I have reviewed the triage vital signs and the nursing notes.  Pertinent labs & imaging results that were available during my care of the patient were reviewed by me and considered in my medical decision making (see chart for details).       68 year old female with nausea/vomiting.  Onset 2 days ago.  Denies any pain & her exam is actually pretty benign.  Given her age though CT the abdomen pelvis was performed.  Lipase is mildly elevated but, again, no tenderness denies pain.  Care signed out to Dr. Ralene Bathe at change of shift with imaging pending.  Final Clinical Impressions(s) / ED Diagnoses   Final diagnoses:  Nausea and vomiting, intractability of vomiting not specified, unspecified vomiting type    ED Discharge Orders    None       Virgel Manifold, MD 11/08/19 1541

## 2019-11-08 NOTE — ED Triage Notes (Signed)
Vomiting for "a couple of days". Denies pain.

## 2019-11-08 NOTE — ED Notes (Signed)
Less than 10 mL saline infiltrated 20 G IV in left AC upon initial bolus injection, IV removed and dressing placed, new 22 G IV started in CT in left forearm, IV remains in place for further use.

## 2019-11-08 NOTE — Discharge Instructions (Addendum)
You had a CT scan performed in the emergency department today. The CT scan demonstrated that you have gallstones in your gallbladder. These do not appear to be causing your symptoms today. Please get rechecked immediately if you develop severe abdominal pain, fevers or new concerning symptoms.

## 2019-11-08 NOTE — ED Notes (Signed)
Patient transported to CT 

## 2019-11-09 ENCOUNTER — Telehealth: Payer: Self-pay

## 2019-11-09 NOTE — Telephone Encounter (Signed)
She said she was on doxycycline last week. Her husband is coming this afternoon to pick up stool kit.

## 2019-11-09 NOTE — Telephone Encounter (Signed)
Called to check on patient after ED visit, spoke with patient's husband patient feels lethargic and still has diarrhea and nausea, she has zofran for the nausea. Husband said she hasn't had any solid foods in a couple of days. Scheduled appointment for Thursday and advised if patient gets worse from now and then to take her back to the hospital.

## 2019-11-09 NOTE — Telephone Encounter (Signed)
See if she has had recent antibiotic therapy. May need stool studies.

## 2019-11-10 ENCOUNTER — Other Ambulatory Visit: Payer: Self-pay

## 2019-11-10 ENCOUNTER — Inpatient Hospital Stay (HOSPITAL_COMMUNITY)
Admission: EM | Admit: 2019-11-10 | Discharge: 2019-11-15 | DRG: 071 | Disposition: A | Discharge status: Home Health | Payer: Medicare Other | Attending: Internal Medicine | Admitting: Internal Medicine

## 2019-11-10 ENCOUNTER — Encounter (HOSPITAL_COMMUNITY): Payer: Self-pay | Admitting: Pharmacy Technician

## 2019-11-10 DIAGNOSIS — R4182 Altered mental status, unspecified: Secondary | ICD-10-CM | POA: Diagnosis present

## 2019-11-10 DIAGNOSIS — R197 Diarrhea, unspecified: Secondary | ICD-10-CM

## 2019-11-10 DIAGNOSIS — Z807 Family history of other malignant neoplasms of lymphoid, hematopoietic and related tissues: Secondary | ICD-10-CM

## 2019-11-10 DIAGNOSIS — E860 Dehydration: Secondary | ICD-10-CM | POA: Diagnosis present

## 2019-11-10 DIAGNOSIS — I8393 Asymptomatic varicose veins of bilateral lower extremities: Secondary | ICD-10-CM | POA: Diagnosis present

## 2019-11-10 DIAGNOSIS — M3481 Systemic sclerosis with lung involvement: Secondary | ICD-10-CM | POA: Diagnosis not present

## 2019-11-10 DIAGNOSIS — D849 Immunodeficiency, unspecified: Secondary | ICD-10-CM | POA: Diagnosis not present

## 2019-11-10 DIAGNOSIS — E039 Hypothyroidism, unspecified: Secondary | ICD-10-CM | POA: Diagnosis present

## 2019-11-10 DIAGNOSIS — E785 Hyperlipidemia, unspecified: Secondary | ICD-10-CM | POA: Diagnosis present

## 2019-11-10 DIAGNOSIS — Z888 Allergy status to other drugs, medicaments and biological substances status: Secondary | ICD-10-CM

## 2019-11-10 DIAGNOSIS — I739 Peripheral vascular disease, unspecified: Secondary | ICD-10-CM | POA: Diagnosis present

## 2019-11-10 DIAGNOSIS — L97929 Non-pressure chronic ulcer of unspecified part of left lower leg with unspecified severity: Secondary | ICD-10-CM | POA: Diagnosis present

## 2019-11-10 DIAGNOSIS — I2729 Other secondary pulmonary hypertension: Secondary | ICD-10-CM | POA: Diagnosis present

## 2019-11-10 DIAGNOSIS — Z20828 Contact with and (suspected) exposure to other viral communicable diseases: Secondary | ICD-10-CM | POA: Diagnosis present

## 2019-11-10 DIAGNOSIS — Z85820 Personal history of malignant melanoma of skin: Secondary | ICD-10-CM

## 2019-11-10 DIAGNOSIS — Z66 Do not resuscitate: Secondary | ICD-10-CM | POA: Diagnosis present

## 2019-11-10 DIAGNOSIS — J9610 Chronic respiratory failure, unspecified whether with hypoxia or hypercapnia: Secondary | ICD-10-CM | POA: Diagnosis present

## 2019-11-10 DIAGNOSIS — Z8572 Personal history of non-Hodgkin lymphomas: Secondary | ICD-10-CM

## 2019-11-10 DIAGNOSIS — F32A Depression, unspecified: Secondary | ICD-10-CM | POA: Diagnosis present

## 2019-11-10 DIAGNOSIS — Z9981 Dependence on supplemental oxygen: Secondary | ICD-10-CM

## 2019-11-10 DIAGNOSIS — R7989 Other specified abnormal findings of blood chemistry: Secondary | ICD-10-CM

## 2019-11-10 DIAGNOSIS — J9611 Chronic respiratory failure with hypoxia: Secondary | ICD-10-CM | POA: Diagnosis not present

## 2019-11-10 DIAGNOSIS — Z880 Allergy status to penicillin: Secondary | ICD-10-CM

## 2019-11-10 DIAGNOSIS — Z9221 Personal history of antineoplastic chemotherapy: Secondary | ICD-10-CM

## 2019-11-10 DIAGNOSIS — K8020 Calculus of gallbladder without cholecystitis without obstruction: Secondary | ICD-10-CM | POA: Diagnosis present

## 2019-11-10 DIAGNOSIS — Z832 Family history of diseases of the blood and blood-forming organs and certain disorders involving the immune mechanism: Secondary | ICD-10-CM

## 2019-11-10 DIAGNOSIS — R945 Abnormal results of liver function studies: Secondary | ICD-10-CM

## 2019-11-10 DIAGNOSIS — F329 Major depressive disorder, single episode, unspecified: Secondary | ICD-10-CM | POA: Diagnosis present

## 2019-11-10 DIAGNOSIS — E876 Hypokalemia: Secondary | ICD-10-CM | POA: Diagnosis present

## 2019-11-10 DIAGNOSIS — Z7902 Long term (current) use of antithrombotics/antiplatelets: Secondary | ICD-10-CM

## 2019-11-10 DIAGNOSIS — Z882 Allergy status to sulfonamides status: Secondary | ICD-10-CM

## 2019-11-10 DIAGNOSIS — I779 Disorder of arteries and arterioles, unspecified: Secondary | ICD-10-CM | POA: Diagnosis present

## 2019-11-10 DIAGNOSIS — I10 Essential (primary) hypertension: Secondary | ICD-10-CM | POA: Diagnosis present

## 2019-11-10 DIAGNOSIS — Z800 Family history of malignant neoplasm of digestive organs: Secondary | ICD-10-CM

## 2019-11-10 DIAGNOSIS — R0602 Shortness of breath: Secondary | ICD-10-CM | POA: Diagnosis not present

## 2019-11-10 DIAGNOSIS — L97509 Non-pressure chronic ulcer of other part of unspecified foot with unspecified severity: Secondary | ICD-10-CM | POA: Diagnosis present

## 2019-11-10 DIAGNOSIS — M349 Systemic sclerosis, unspecified: Secondary | ICD-10-CM | POA: Diagnosis present

## 2019-11-10 DIAGNOSIS — I2721 Secondary pulmonary arterial hypertension: Secondary | ICD-10-CM | POA: Diagnosis present

## 2019-11-10 DIAGNOSIS — I6523 Occlusion and stenosis of bilateral carotid arteries: Secondary | ICD-10-CM | POA: Diagnosis not present

## 2019-11-10 DIAGNOSIS — I83219 Varicose veins of right lower extremity with both ulcer of unspecified site and inflammation: Secondary | ICD-10-CM | POA: Diagnosis present

## 2019-11-10 DIAGNOSIS — Z7952 Long term (current) use of systemic steroids: Secondary | ICD-10-CM

## 2019-11-10 DIAGNOSIS — I6501 Occlusion and stenosis of right vertebral artery: Secondary | ICD-10-CM | POA: Diagnosis not present

## 2019-11-10 DIAGNOSIS — E878 Other disorders of electrolyte and fluid balance, not elsewhere classified: Secondary | ICD-10-CM | POA: Diagnosis present

## 2019-11-10 DIAGNOSIS — G9341 Metabolic encephalopathy: Secondary | ICD-10-CM | POA: Diagnosis not present

## 2019-11-10 DIAGNOSIS — J449 Chronic obstructive pulmonary disease, unspecified: Secondary | ICD-10-CM | POA: Diagnosis present

## 2019-11-10 DIAGNOSIS — Z8349 Family history of other endocrine, nutritional and metabolic diseases: Secondary | ICD-10-CM

## 2019-11-10 LAB — BASIC METABOLIC PANEL
Anion gap: 13 (ref 5–15)
BUN: 11 mg/dL (ref 8–23)
CO2: 24 mmol/L (ref 22–32)
Calcium: 6.7 mg/dL — ABNORMAL LOW (ref 8.9–10.3)
Chloride: 96 mmol/L — ABNORMAL LOW (ref 98–111)
Creatinine, Ser: 0.98 mg/dL (ref 0.44–1.00)
GFR calc Af Amer: >60 mL/min (ref >60)
GFR calc non Af Amer: 58 mL/min — ABNORMAL LOW (ref >60)
Glucose, Bld: 120 mg/dL — ABNORMAL HIGH (ref 70–99)
Potassium: 3 mmol/L — ABNORMAL LOW (ref 3.5–5.1)
Sodium: 133 mmol/L — ABNORMAL LOW (ref 135–145)

## 2019-11-10 LAB — CBC
HCT: 35.2 % — ABNORMAL LOW (ref 36.0–46.0)
Hemoglobin: 11 g/dL — ABNORMAL LOW (ref 12.0–15.0)
MCH: 32.8 pg (ref 26.0–34.0)
MCHC: 31.3 g/dL (ref 30.0–36.0)
MCV: 105.1 fL — ABNORMAL HIGH (ref 80.0–100.0)
Platelets: 225 10*3/uL (ref 150–400)
RBC: 3.35 MIL/uL — ABNORMAL LOW (ref 3.87–5.11)
RDW: 13.4 % (ref 11.5–15.5)
WBC: 8.5 10*3/uL (ref 4.0–10.5)
nRBC: 0 % (ref 0.0–0.2)

## 2019-11-10 MED ORDER — SODIUM CHLORIDE 0.9% FLUSH: 3.0000 mL | Freq: Once | INTRAVENOUS | Status: DC

## 2019-11-10 MED ORDER — ONDANSETRON HCL 4 MG PO TABS: 4.0000 mg | ORAL_TABLET | Freq: Three times a day (TID) | ORAL | 0 refills | Status: DC | PRN

## 2019-11-10 NOTE — Telephone Encounter (Signed)
Husband dropped off stool samples today and is requesting a refill on zofran he said they only gave them 3 days at the ED.

## 2019-11-10 NOTE — ED Triage Notes (Signed)
Pt reports stomach virus over the weakend with emesis and diarrhea. Pt family reports near syncopal event and after she "came too" pt was "speaking jibberish". Family reports this episode happened about 1900 tonight.

## 2019-11-11 ENCOUNTER — Emergency Department (HOSPITAL_COMMUNITY): Payer: Medicare Other

## 2019-11-11 ENCOUNTER — Encounter (HOSPITAL_COMMUNITY): Payer: Self-pay | Admitting: Internal Medicine

## 2019-11-11 ENCOUNTER — Observation Stay (HOSPITAL_COMMUNITY)
Admit: 2019-11-11 | Discharge: 2019-11-11 | Disposition: A | Discharge status: Home / Self Care | Payer: Medicare Other | Attending: Neurology | Admitting: Neurology

## 2019-11-11 DIAGNOSIS — I6501 Occlusion and stenosis of right vertebral artery: Secondary | ICD-10-CM | POA: Diagnosis not present

## 2019-11-11 DIAGNOSIS — R4182 Altered mental status, unspecified: Secondary | ICD-10-CM | POA: Diagnosis not present

## 2019-11-11 DIAGNOSIS — E876 Hypokalemia: Secondary | ICD-10-CM | POA: Diagnosis not present

## 2019-11-11 DIAGNOSIS — I6523 Occlusion and stenosis of bilateral carotid arteries: Secondary | ICD-10-CM | POA: Diagnosis not present

## 2019-11-11 DIAGNOSIS — R400 Somnolence: Secondary | ICD-10-CM

## 2019-11-11 DIAGNOSIS — E039 Hypothyroidism, unspecified: Secondary | ICD-10-CM | POA: Diagnosis not present

## 2019-11-11 LAB — URINALYSIS, ROUTINE W REFLEX MICROSCOPIC
Bilirubin Urine: NEGATIVE
Glucose, UA: NEGATIVE mg/dL
Ketones, ur: NEGATIVE mg/dL
Nitrite: NEGATIVE
Protein, ur: 100 mg/dL — AB
Specific Gravity, Urine: 1.013 (ref 1.005–1.030)
pH: 5 (ref 5.0–8.0)

## 2019-11-11 LAB — COMPREHENSIVE METABOLIC PANEL
ALT: 29 U/L (ref 0–44)
AST: 47 U/L — ABNORMAL HIGH (ref 15–41)
Albumin: 2.8 g/dL — ABNORMAL LOW (ref 3.5–5.0)
Alkaline Phosphatase: 109 U/L (ref 38–126)
Anion gap: 17 — ABNORMAL HIGH (ref 5–15)
BUN: 13 mg/dL (ref 8–23)
CO2: 18 mmol/L — ABNORMAL LOW (ref 22–32)
Calcium: 6.7 mg/dL — ABNORMAL LOW (ref 8.9–10.3)
Chloride: 104 mmol/L (ref 98–111)
Creatinine, Ser: 0.98 mg/dL (ref 0.44–1.00)
GFR calc Af Amer: >60 mL/min (ref >60)
GFR calc non Af Amer: 58 mL/min — ABNORMAL LOW (ref >60)
Glucose, Bld: 77 mg/dL (ref 70–99)
Potassium: 4 mmol/L (ref 3.5–5.1)
Sodium: 139 mmol/L (ref 135–145)
Total Bilirubin: 1.1 mg/dL (ref 0.3–1.2)
Total Protein: 6.6 g/dL (ref 6.5–8.1)

## 2019-11-11 LAB — HEPATIC FUNCTION PANEL
ALT: 26 U/L (ref 0–44)
AST: 40 U/L (ref 15–41)
Albumin: 3 g/dL — ABNORMAL LOW (ref 3.5–5.0)
Alkaline Phosphatase: 78 U/L (ref 38–126)
Bilirubin, Direct: 0.3 mg/dL — ABNORMAL HIGH (ref 0.0–0.2)
Indirect Bilirubin: 1 mg/dL — ABNORMAL HIGH (ref 0.3–0.9)
Total Bilirubin: 1.3 mg/dL — ABNORMAL HIGH (ref 0.3–1.2)
Total Protein: 6.8 g/dL (ref 6.5–8.1)

## 2019-11-11 LAB — TROPONIN I (HIGH SENSITIVITY): Troponin I (High Sensitivity): 30 ng/L — ABNORMAL HIGH (ref <18)

## 2019-11-11 LAB — HIV ANTIBODY (ROUTINE TESTING W REFLEX): HIV Screen 4th Generation wRfx: NONREACTIVE

## 2019-11-11 LAB — MAGNESIUM
Magnesium: 0.6 mg/dL — CL (ref 1.7–2.4)
Magnesium: 1.1 mg/dL — ABNORMAL LOW (ref 1.7–2.4)

## 2019-11-11 LAB — SARS CORONAVIRUS 2 (TAT 6-24 HRS): SARS Coronavirus 2: NEGATIVE

## 2019-11-11 LAB — TSH: TSH: 1.975 u[IU]/mL (ref 0.350–4.500)

## 2019-11-11 LAB — PHOSPHORUS: Phosphorus: 3 mg/dL (ref 2.5–4.6)

## 2019-11-11 MED ORDER — VITAMIN B-12 1000 MCG PO TABS
1000.0000 ug | ORAL_TABLET | Freq: Every day | ORAL | Status: DC
  Administered 2019-11-11 – 2019-11-15 (×5): 1000 ug via ORAL
  Filled 2019-11-11 (×5): qty 1

## 2019-11-11 MED ORDER — SACCHAROMYCES BOULARDII 250 MG PO CAPS
250.0000 mg | ORAL_CAPSULE | Freq: Two times a day (BID) | ORAL | Status: DC
  Administered 2019-11-12 – 2019-11-15 (×7): 250 mg via ORAL
  Filled 2019-11-11 (×9): qty 1

## 2019-11-11 MED ORDER — LEVOTHYROXINE SODIUM 50 MCG PO TABS
50.0000 ug | ORAL_TABLET | Freq: Every day | ORAL | Status: DC
  Administered 2019-11-13 – 2019-11-15 (×3): 50 ug via ORAL
  Filled 2019-11-11 (×4): qty 1

## 2019-11-11 MED ORDER — SILDENAFIL CITRATE 20 MG PO TABS
60.0000 mg | ORAL_TABLET | Freq: Three times a day (TID) | ORAL | Status: DC
  Administered 2019-11-12 – 2019-11-15 (×10): 60 mg via ORAL
  Filled 2019-11-11 (×13): qty 3

## 2019-11-11 MED ORDER — CYCLOSPORINE 0.05 % OP EMUL
1.0000 [drp] | Freq: Two times a day (BID) | OPHTHALMIC | Status: DC
  Administered 2019-11-11 – 2019-11-15 (×8): 1 [drp] via OPHTHALMIC
  Filled 2019-11-11 (×9): qty 30

## 2019-11-11 MED ORDER — SPIRONOLACTONE 25 MG PO TABS
25.0000 mg | ORAL_TABLET | Freq: Every day | ORAL | Status: DC
  Administered 2019-11-11: 25 mg via ORAL
  Filled 2019-11-11: qty 1

## 2019-11-11 MED ORDER — SODIUM CHLORIDE 0.9 % IV BOLUS
1000.0000 mL | Freq: Once | INTRAVENOUS | Status: AC
  Administered 2019-11-11: 13:00:00 1000 mL via INTRAVENOUS

## 2019-11-11 MED ORDER — NIFEDIPINE ER OSMOTIC RELEASE 30 MG PO TB24
30.0000 mg | ORAL_TABLET | Freq: Every day | ORAL | Status: DC
  Administered 2019-11-12 – 2019-11-15 (×4): 30 mg via ORAL
  Filled 2019-11-11 (×5): qty 1

## 2019-11-11 MED ORDER — IOHEXOL 350 MG/ML SOLN
100.0000 mL | Freq: Once | INTRAVENOUS | Status: AC | PRN
  Administered 2019-11-11: 13:00:00 100 mL via INTRAVENOUS

## 2019-11-11 MED ORDER — MAGNESIUM OXIDE 400 (241.3 MG) MG PO TABS
800.0000 mg | ORAL_TABLET | Freq: Two times a day (BID) | ORAL | Status: DC
  Filled 2019-11-11: qty 2

## 2019-11-11 MED ORDER — MAGNESIUM SULFATE 2 GM/50ML IV SOLN
2.0000 g | Freq: Once | INTRAVENOUS | Status: AC
  Administered 2019-11-11: 2 g via INTRAVENOUS
  Filled 2019-11-11: qty 50

## 2019-11-11 MED ORDER — HYDROCODONE-ACETAMINOPHEN 5-325 MG PO TABS: 1.0000 | ORAL_TABLET | Freq: Four times a day (QID) | ORAL | Status: DC | PRN

## 2019-11-11 MED ORDER — PREDNISONE 5 MG PO TABS
5.0000 mg | ORAL_TABLET | Freq: Every day | ORAL | Status: DC
  Administered 2019-11-12 – 2019-11-15 (×4): 5 mg via ORAL
  Filled 2019-11-11 (×5): qty 1

## 2019-11-11 MED ORDER — POLYSACCHARIDE IRON COMPLEX 150 MG PO CAPS
150.0000 mg | ORAL_CAPSULE | Freq: Two times a day (BID) | ORAL | Status: DC
  Administered 2019-11-12 – 2019-11-15 (×7): 150 mg via ORAL
  Filled 2019-11-11 (×9): qty 1

## 2019-11-11 MED ORDER — POTASSIUM CHLORIDE 10 MEQ/100ML IV SOLN
10.0000 meq | Freq: Once | INTRAVENOUS | Status: AC
  Administered 2019-11-11: 10 meq via INTRAVENOUS
  Filled 2019-11-11: qty 100

## 2019-11-11 MED ORDER — CLOPIDOGREL BISULFATE 75 MG PO TABS
75.0000 mg | ORAL_TABLET | Freq: Every day | ORAL | Status: DC
  Administered 2019-11-12 – 2019-11-15 (×4): 75 mg via ORAL
  Filled 2019-11-11 (×5): qty 1

## 2019-11-11 MED ORDER — PANTOPRAZOLE SODIUM 40 MG PO TBEC
40.0000 mg | DELAYED_RELEASE_TABLET | Freq: Two times a day (BID) | ORAL | Status: DC
  Administered 2019-11-12 – 2019-11-15 (×7): 40 mg via ORAL
  Filled 2019-11-11 (×8): qty 1

## 2019-11-11 MED ORDER — PROBIOTIC PO CAPS: 1.0000 | ORAL_CAPSULE | Freq: Every day | ORAL | Status: DC

## 2019-11-11 MED ORDER — GABAPENTIN 300 MG PO CAPS
600.0000 mg | ORAL_CAPSULE | Freq: Two times a day (BID) | ORAL | Status: DC
  Filled 2019-11-11: qty 2

## 2019-11-11 MED ORDER — MACITENTAN 10 MG PO TABS
10.0000 mg | ORAL_TABLET | Freq: Every day | ORAL | Status: DC
  Administered 2019-11-12 – 2019-11-15 (×4): 10 mg via ORAL
  Filled 2019-11-11 (×5): qty 1

## 2019-11-11 MED ORDER — CITALOPRAM HYDROBROMIDE 20 MG PO TABS
20.0000 mg | ORAL_TABLET | Freq: Every day | ORAL | Status: DC
  Administered 2019-11-12 – 2019-11-15 (×4): 20 mg via ORAL
  Filled 2019-11-11 (×5): qty 1

## 2019-11-11 MED ORDER — CALCIUM GLUCONATE-NACL 1-0.675 GM/50ML-% IV SOLN
1.0000 g | Freq: Once | INTRAVENOUS | Status: AC
  Administered 2019-11-11: 1000 mg via INTRAVENOUS
  Filled 2019-11-11: qty 50

## 2019-11-11 MED ORDER — ENOXAPARIN SODIUM 40 MG/0.4ML ~~LOC~~ SOLN
40.0000 mg | SUBCUTANEOUS | Status: DC
  Administered 2019-11-11 – 2019-11-14 (×3): 40 mg via SUBCUTANEOUS
  Filled 2019-11-11 (×3): qty 0.4

## 2019-11-11 MED ORDER — POTASSIUM CHLORIDE CRYS ER 20 MEQ PO TBCR
20.0000 meq | EXTENDED_RELEASE_TABLET | Freq: Two times a day (BID) | ORAL | Status: AC
  Administered 2019-11-12 (×2): 20 meq via ORAL
  Filled 2019-11-11 (×3): qty 1

## 2019-11-11 MED ORDER — POTASSIUM CHLORIDE IN NACL 40-0.9 MEQ/L-% IV SOLN
INTRAVENOUS | Status: DC
  Administered 2019-11-11 (×2): 100 mL/h via INTRAVENOUS
  Filled 2019-11-11 (×4): qty 1000

## 2019-11-11 MED ORDER — SODIUM CHLORIDE 0.9 % IV SOLN: 1.0000 g | Freq: Once | INTRAVENOUS | Status: DC

## 2019-11-11 NOTE — Progress Notes (Signed)
New Admission Note:  Arrival Method: Via stretcher from ED Mental Orientation: Alert & Oriented x1 (self) Telemetry: CCMD verified Assessment: Completed Skin: Refer to flowsheet IV: Left FA & Right AC Pain: 0/10 Safety Measures: Safety Fall Prevention Plan discussed with patient. Admission: Completed [facility] [place] Orientation: Patient has been orientated to the room, unit and the staff. Family: Husband came up with patient and then left.  Orders have been reviewed and are being implemented. Will continue to monitor the patient. Call light has been placed within reach and bed alarm has been activated.   Vassie Moselle, RN  Phone Number: [phone]

## 2019-11-11 NOTE — H&P (Signed)
History and Physical    [name] [name] NGE:[id number] DOB: 04/16/1951 DOA: 11/10/2019  PCP: [name], MD  Patient coming from: home   I have personally briefly reviewed patient's old medical records available.   Chief Complaint: Nausea, vomiting, feeling extremely weak.  HPI: [name] [name] is a 68 y.o. 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  Patient is lethargic and unable to provide detailed history.  Husband is at the bedside. According to the patient's husband, patient had recently undergone right finger amputations due to her scleroderma and necrosis last month.  About 2 weeks ago and more severe about 1 week, she started feeling unwell, nauseous all the time and few episodes of vomiting, she has not been able to eat anything for at least last few days.  Patient had 2-3 loose bowel movements, mostly watery a day since last 1 week.  No fever or chills.  No abdominal pain.  Every day she would look more lethargic. Denies any recent travel.  Denies any sick exposure.  No recent antibiotic use. ED Course: Patient afebrile in the ER.  Blood pressures are stable.  She is on 2 L oxygen and saturating 100% that she uses at home.  Potassium is 3, magnesium is 0.6, calcium 6.7.  Due to altered mental status and unable to talk, a stroke alert was called.  A CT head and CT angiogram of the neck and head is essentially normal.  She was in the emergency room 3 days ago, a CT scan of the abdomen pelvis was essentially normal. Patient was also seen and evaluated by her primary care physician yesterday, a C. difficile was negative.  Stool cultures are  pending. Her diarrhea has improved.  She has no bowel movement for almost 24 hours after last stool test samples.  Extreme lethargic. Patient was given IV fluids, 10 mEq of potassium and 2 g of magnesium in the ER and admission requested. ER also consulted neurology, they recommended treatment for metabolic causes and stroke was ruled out.  Review of Systems: all systems are reviewed and pertinent positive as per HPI otherwise rest are negative.    Past Medical History:  Diagnosis Date  . Anemia   . Arthritis   . DOE (dyspnea on exertion)   . Epistaxis   . History of chicken pox   . Hypertension   . Hypothyroidism   . Leg ulcer ([place])   . Melanoma ([place]) 1999  . 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[place])   . Non Hodgkin's lymphoma ([place])    Treated with chemo 2010, felt to be cured.  Marland Kitchen PAH (pulmonary artery hypertension) ([place])   . Peripheral arterial occlusive disease ([place])   . Pulmonary hypertension ([place])   . Requires supplemental oxygen    2 liters-3liters when moving  . 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 AMPUTATION Right 10/24/2017   Procedure: RAY resection right index;  Surgeon: Daryll Brod, MD;  Location: [place];  Service: Orthopedics;  Laterality: Right;  Axillary block  . AMPUTATION Right 11/21/2018   Procedure: AMPUTATION RAY RIGHT MIDDLE FINGER;  Surgeon: Daryll Brod, MD;  Location: [place];  Service: Orthopedics;  Laterality: Right;  . AMPUTATION Right 10/22/2019   Procedure: AMPUTATION DIGIT RIGHT RING AND SMALL FINGER, DIGITAL SYMPATHECTOMY RIGHT RADIAL ULNAR ARCH;  Surgeon: Daryll Brod, MD;  Location: [place];  Service: Orthopedics;  Laterality: Right;  AXILARY  . apligraph  12-04-2102,   12-18-2012   bilateral calves   [name]  . biopsy on cerebellum    . COLONOSCOPY WITH PROPOFOL Left 10/07/2015   Procedure: COLONOSCOPY WITH PROPOFOL;  Surgeon: [name], MD;  Location: [place];   Service: Endoscopy;  Laterality: Left;  . ENDARTERECTOMY FEMORAL Right 09/14/2016   Procedure: ENDARTERECTOMY FEMORAL RIGHT;  Surgeon: [name], MD;  Location: [facility] OR;  Service: Vascular;  Laterality: Right;  . ENDOVENOUS ABLATION SAPHENOUS VEIN W/ LASER  07-24-2012   right greater saphenous vein by [name], M.D.   . ENDOVENOUS ABLATION SAPHENOUS VEIN W/ LASER  10-16-2012   left greater saphenous vein  by Dr. [name]  . ENDOVENOUS ABLATION SAPHENOUS VEIN W/ LASER Right 09-01-2015   endovenous laser ablation right greater saphenous vein by [name] MD  . ESOPHAGOGASTRODUODENOSCOPY N/A 05/07/2016   Procedure: ESOPHAGOGASTRODUODENOSCOPY (EGD);  Surgeon: [name], MD;  Location: [facility] ENDOSCOPY;  Service: Endoscopy;  Laterality: N/A;  . ESOPHAGOGASTRODUODENOSCOPY (EGD) WITH PROPOFOL Left 10/07/2015   Procedure: ESOPHAGOGASTRODUODENOSCOPY (EGD) WITH PROPOFOL;  Surgeon: [name], MD;  Location: [place];  Service: Endoscopy;  Laterality: Left;  . GIVENS CAPSULE STUDY N/A 07/31/2016   Procedure: GIVENS CAPSULE STUDY;  Surgeon: [name], MD;  Location: [facility] ENDOSCOPY;  Service: Endoscopy;  Laterality: N/A;  . melonoma removes Right    behind right knee  . PATCH ANGIOPLASTY Right 09/14/2016   Procedure: PATCH ANGIOPLASTY RIGHT FEMORAL ARTERY USING HEMASHIELD PLATINUM FINESSE PATCH;  Surgeon: [name], MD;  Location: [place];  Service: Vascular;  Laterality: Right;  . PERIPHERAL VASCULAR CATHETERIZATION N/A 09/07/2016   Procedure: Abdominal Aortogram w/Lower Extremity;  Surgeon: [name], MD;  Location: [place] CV LAB;  Service: Cardiovascular;  Laterality: N/A;  . RIGHT HEART CATH N/A 04/19/2017   Procedure: Right Heart Cath;  Surgeon: [name], MD;  Location: [place] CV LAB;  Service: Cardiovascular;  Laterality: N/A;  . RIGHT HEART CATHETERIZATION N/A 11/14/2012   Procedure: RIGHT HEART CATH;  Surgeon: [name], MD;  Location: [facility] CATH LAB;  Service:  Cardiovascular;  Laterality: N/A;  . rt little toe removal  10/1998  . stent left thigh  3/11  . TENDON TRANSFER Right 10/24/2017   Procedure: transfer extensor indecus propius/extensor digitorum commomus index to middle ring and small;  Surgeon: Daryll Brod, MD;  Location: [place];  Service: Orthopedics;  Laterality: Right;  . TONSILLECTOMY    . TONSILLECTOMY AND ADENOIDECTOMY    . TUNNELED VENOUS CATHETER PLACEMENT  01/2013  . ULNAR HEAD EXCISION Right 10/24/2017   Procedure: darrach right ulna;  Surgeon: Daryll Brod, MD;  Location: [place];  Service: Orthopedics;  Laterality: Right;  . VEIN LIGATION AND STRIPPING  05/16/2012   Procedure: VEIN LIGATION AND STRIPPING;  Surgeon: [name], MD;  Location: [facility] OR;  Service: Vascular;  Laterality: Right;  Irrigation and Debridement right lower leg, ligation of saphenous vein.     reports that she has never smoked. She has never used smokeless tobacco. She reports current alcohol use. She reports that she does not use drugs.  Allergies  Allergen Reactions  . Levofloxacin Other (See Comments)    FLU LIKE SYMPTOM Other reaction(s): Other (  See Comments) FLU LIKE SYMPTOM  . Sulfa Antibiotics Rash and Other (See Comments)    Face peeled Severe rash with significant peeling of face. No airway involvement per patient Face peeled  . Levofloxacin In D5w Other (See Comments)    FLU LIKE SYMPTOMS FLU LIKE SYMPTOMS  . Doxycycline Hyclate Swelling    SWELLING REACTION UNSPECIFIED  "TABLETS ONLY - CAPSULES ARE TOLERATED FINE"  . Penicillins Rash    Has patient had a PCN reaction causing immediate rash, facial/tongue/throat swelling, SOB or lightheadedness with hypotension: no Has patient had a PCN reaction causing severe rash involving mucus membranes or skin necrosis: no Has patient had a PCN reaction that required hospitalization: no Has patient had a PCN reaction occurring within the last 10 years: {no If all of the above answers are "NO", then may  proceed with Cephalosporin use.    Family History  Problem Relation Age of Onset  . Lupus Father   . Lymphoma Father   . Hyperlipidemia Mother   . Cancer Daughter        sarcoma  . Colon cancer Other      Prior to Admission medications   Medication Sig Start Date End Date Taking? Authorizing Provider  acetaminophen (TYLENOL ARTHRITIS PAIN) 650 MG CR tablet Take 1,300 mg by mouth every 8 (eight) hours.     [provider]  atorvastatin (LIPITOR) 20 MG tablet Take 1 tablet (20 mg total) by mouth daily. 06/18/19   [name], MD  Biotin 5000 MCG TABS Take 5,000 mcg by mouth daily.    [provider]  CALCIUM-MAGNESIUM-VITAMIN D PO Take 2 tablets by mouth daily.    [provider]  Cholecalciferol (VITAMIN D) 2000 units CAPS Take 2,000 Units by mouth daily.    [provider]  citalopram (CELEXA) 20 MG tablet TAKE 1 TABLET BY MOUTH EVERYDAY AT BEDTIME Patient taking differently: Take 20 mg by mouth daily.  07/11/19   [name], MD  clopidogrel (PLAVIX) 75 MG tablet TAKE 1 TABLET BY MOUTH EVERY DAY 03/11/19   Larey Dresser, MD  FERREX 150 150 MG capsule TAKE ONE CAPSULE BY MOUTH TWICE A DAY 01/26/19   [name], MD  fish oil-omega-3 fatty acids 1000 MG capsule Take 1,000 mg by mouth 2 (two) times daily.     [provider]  gabapentin (NEURONTIN) 300 MG capsule Take 600 mg by mouth 2 (two) times daily.    [provider]  HYDROcodone-acetaminophen (NORCO) 5-325 MG tablet Take 1 tablet by mouth every 6 (six) hours as needed for moderate pain. 10/22/19   Daryll Brod, MD  levothyroxine (SYNTHROID) 50 MCG tablet TAKE 1 TABLET BY MOUTH DAILY BEFORE BREAKFAST Patient taking differently: Take 50 mcg by mouth daily before breakfast.  09/09/19   [name], MD  Multiple Vitamin (MULTIVITAMIN WITH MINERALS) TABS tablet Take 1 tablet by mouth daily.    [provider]  NIFEdipine (ADALAT CC) 30 MG 24 hr tablet Take 30 mg by  mouth daily.     [provider]  ondansetron (ZOFRAN ODT) 4 MG disintegrating tablet Take 1 tablet (4 mg total) by mouth every 8 (eight) hours as needed for nausea or vomiting. 11/08/19   Virgel Manifold, MD  ondansetron (ZOFRAN) 4 MG tablet Take 1 tablet (4 mg total) by mouth every 8 (eight) hours as needed for nausea or vomiting. 11/10/19   [name], MD  OPSUMIT 10 MG tablet Take 1 tablet (64m)  by mouth daily. 04/10/19   Bensimhon, Shaune Pascal, MD  OXYGEN Inhale 2-3 L into the lungs continuous.     [provider]  pantoprazole (PROTONIX) 40 MG tablet Take 1 tablet (40 mg total) by mouth 2 (two) times daily. 05/08/16   Robbie Lis, MD  Probiotic CAPS Take 1 capsule by mouth daily.    [provider]  RESTASIS 0.05 % ophthalmic emulsion Place 1 drop into both eyes 2 (two) times daily. 08/28/19   [provider]  sildenafil (REVATIO) 20 MG tablet Take 3 tablets (60 mg total) by mouth 3 (three) times daily. 10/28/19   Bensimhon, Shaune Pascal, MD  spironolactone (ALDACTONE) 25 MG tablet TAKE 1 TABLET BY MOUTH EVERY DAY 08/17/19   Bensimhon, Shaune Pascal, MD  torsemide (DEMADEX) 20 MG tablet Take 9m in the AM and 265min the PM 10/05/19   Bensimhon, DaShaune PascalMD  valACYclovir (VALTREX) 500 MG tablet Take 1 tablet (500 mg total) by mouth 2 (two) times daily. Patient taking differently: Take 500 mg by mouth 2 (two) times daily as needed (cold sores).  09/02/19   BaElby ShowersMD  vitamin B-12 (CYANOCOBALAMIN) 1000 MCG tablet Take 1,000 mcg by mouth daily.    [provider]    Physical Exam: Vitals:   11/11/19 1300 11/11/19 1315 11/11/19 1330 11/11/19 1330  BP: (!) 163/54 (!) 145/73 (!) 155/67   Pulse: 70 89 78   Resp: (!) 21     Temp:    100.1 F (37.8 C)  TempSrc:    Rectal  SpO2: 100% 95% 97%     Constitutional: NAD, calm, comfortable Vitals:   11/11/19 1300 11/11/19 1315 11/11/19 1330 11/11/19 1330  BP: (!) 163/54 (!) 145/73 (!) 155/67   Pulse: 70  89 78   Resp: (!) 21     Temp:    100.1 F (37.8 C)  TempSrc:    Rectal  SpO2: 100% 95% 97%    Eyes: PERRL, lids and conjunctivae normal ENMT: Mucous membranes are dry . Posterior pharynx clear of any exudate or lesions.Normal dentition.  Neck: normal, supple, no masses, no thyromegaly Respiratory: clear to auscultation bilaterally, no wheezing, no crackles. Normal respiratory effort. No accessory muscle use.  Cardiovascular: Regular rate and rhythm, no murmurs / rubs / gallops. No extremity edema. 2+ pedal pulses. No carotid bruits.  Abdomen: no tenderness, no masses palpated. No hepatosplenomegaly. Bowel sounds positive.  Musculoskeletal: no clubbing. Right fingers on postsurgical dressings. All 4 extremities are cold to touch. Left leg on [place] boot. Skin: no rashes. No induration Neurologic: CN 2-12 grossly intact.  Patient is alert, she is not oriented to place and time.  She is oriented to person.  Extremely lethargic and sleepy.  Moves all extremities equally.  Sensation intact, DTR normal. Strength 5/5 in all 4.  Psychiatric: Impaired judgment and insight. Alert and oriented x 2.  Flat affect.    Labs on Admission: I have personally reviewed following labs and imaging studies  CBC: Recent Labs  Lab 11/08/19 1329 11/10/19 2104  WBC 7.7 8.5  HGB 11.4* 11.0*  HCT 36.5 35.2*  MCV 106.1* 105.1*  PLT 266 22270 Basic Metabolic Panel: Recent Labs  Lab 11/08/19 1329 11/10/19 2104 11/11/19 1203  NA 137 133*  --   K 3.1* 3.0*  --   CL 101 96*  --   CO2 21* 24  --   GLUCOSE 92 120*  --   BUN 17 11  --  CREATININE 0.72 0.98  --   CALCIUM 7.2* 6.7*  --   MG  --   --  0.6*   GFR: Estimated Creatinine Clearance: 51.6 mL/min (by C-G formula based on SCr of 0.98 mg/dL). Liver Function Tests: Recent Labs  Lab 11/08/19 1329 11/11/19 1203  AST 32 40  ALT 16 26  ALKPHOS 64 78  BILITOT 0.7 1.3*  PROT 7.3 6.8  ALBUMIN 3.6 3.0*   Recent Labs  Lab 11/08/19 1329   LIPASE 109*   No results for input(s): AMMONIA in the last 168 hours. Coagulation Profile: No results for input(s): INR, PROTIME in the last 168 hours. Cardiac Enzymes: No results for input(s): CKTOTAL, CKMB, CKMBINDEX, TROPONINI in the last 168 hours. BNP (last 3 results) No results for input(s): PROBNP in the last 8760 hours. HbA1C: No results for input(s): HGBA1C in the last 72 hours. CBG: No results for input(s): GLUCAP in the last 168 hours. Lipid Profile: No results for input(s): CHOL, HDL, LDLCALC, TRIG, CHOLHDL, LDLDIRECT in the last 72 hours. Thyroid Function Tests: No results for input(s): TSH, T4TOTAL, FREET4, T3FREE, THYROIDAB in the last 72 hours. Anemia Panel: No results for input(s): VITAMINB12, FOLATE, FERRITIN, TIBC, IRON, RETICCTPCT in the last 72 hours. Urine analysis:    Component Value Date/Time   COLORURINE YELLOW 11/10/2019 2102   APPEARANCEUR HAZY (A) 11/10/2019 2102   LABSPEC 1.013 11/10/2019 2102   PHURINE 5.0 11/10/2019 2102   GLUCOSEU NEGATIVE 11/10/2019 2102   HGBUR SMALL (A) 11/10/2019 2102   BILIRUBINUR NEGATIVE 11/10/2019 2102   BILIRUBINUR NEG 12/10/2018 [place] 11/10/2019 2102   PROTEINUR 100 (A) 11/10/2019 2102   UROBILINOGEN 0.2 12/10/2018 1211   NITRITE NEGATIVE 11/10/2019 2102   LEUKOCYTESUR MODERATE (A) 11/10/2019 2102    Radiological Exams on Admission: Ct Angio Head W Or Wo Contrast  Result Date: 11/11/2019 CLINICAL DATA:  Slurred speech and weakness.  Rule out stroke EXAM: CT ANGIOGRAPHY HEAD AND NECK TECHNIQUE: Multidetector CT imaging of the head and neck was performed using the standard protocol during bolus administration of intravenous contrast. Multiplanar CT image reconstructions and MIPs were obtained to evaluate the vascular anatomy. Carotid stenosis measurements (when applicable) are obtained utilizing NASCET criteria, using the distal internal carotid diameter as the denominator. CONTRAST:  125m  OMNIPAQUE IOHEXOL 350 MG/ML SOLN COMPARISON:  CT head 11/11/2019 FINDINGS: CTA NECK FINDINGS Aortic arch: Atherosclerotic aortic arch. Aorta mass and mildly dilated. Ascending aorta 38 mm in diameter. Atherosclerotic disease in the proximal great vessels without significant stenosis. Right carotid system: Right common carotid artery widely patent. Atherosclerotic calcification right carotid bifurcation. Less than 25% diameter stenosis proximal right internal carotid artery. Left carotid system: Left common carotid artery widely patent. Mild atherosclerotic calcification left carotid bifurcation without significant carotid stenosis. Vertebral arteries: Both vertebral arteries patent to the basilar. Mild-to-moderate stenosis distal right vertebral artery otherwise no vertebral stenosis. Skeleton: Marked kyphoscoliosis in the thoracic spine. No acute skeletal abnormality. Left occipital craniotomy and cranioplasty. Other neck: Negative for soft tissue mass. Upper chest: Apical scarring. Patchy airspace disease in the lung apices bilaterally which could be scarring or infection. 5 mm left upper lobe nodule Review of the MIP images confirms the above findings CTA HEAD FINDINGS Anterior circulation: Atherosclerotic calcification in the cavernous carotid bilaterally. Mild to moderate stenosis in the cavernous carotid bilaterally. Anterior and middle cerebral arteries patent bilaterally without significant stenosis. Posterior circulation: Mild to moderate stenosis distal right vertebral artery. Left vertebral patent. PICA patent bilaterally.  Basilar widely patent. Superior cerebellar and posterior cerebral arteries patent bilaterally. Venous sinuses: Patent Anatomic variants: None Review of the MIP images confirms the above findings IMPRESSION: 1. Atherosclerotic calcification carotid bifurcation without significant stenosis. Mild to moderate stenosis cavernous carotid bilaterally 2. Mild to moderate stenosis distal right  vertebral artery. Left vertebral artery widely patent 3. No intracranial large vessel occlusion. Electronically Signed   By: Franchot Gallo M.D.   On: 11/11/2019 13:05   Ct Head Wo Contrast  Result Date: 11/11/2019 CLINICAL DATA:  Altered mental status. EXAM: CT HEAD WITHOUT CONTRAST TECHNIQUE: Contiguous axial images were obtained from the base of the skull through the vertex without intravenous contrast. COMPARISON:  Brain MRI 11/29/2017 FINDINGS: Brain: Ventricles, cisterns and other CSF spaces are within normal. There is mild chronic ischemic microvascular disease. Stable focus of encephalomalacia over the right cerebellar hemisphere. No mass, mass effect, shift of midline structures or acute hemorrhage. No evidence of acute infarction. Vascular: No hyperdense vessel or unexpected calcification. Skull: Previous right occipital craniotomy unchanged. Sinuses/Orbits: No acute finding. Other: None. IMPRESSION: 1.  No acute findings. 2. Mild chronic ischemic microvascular disease. Postsurgical change right cerebellar hemisphere with postsurgical change of the right occipital skull. Electronically Signed   By: Marin Olp M.D.   On: 11/11/2019 11:57   Ct Angio Neck W Or Wo Contrast  Result Date: 11/11/2019 CLINICAL DATA:  Slurred speech and weakness.  Rule out stroke EXAM: CT ANGIOGRAPHY HEAD AND NECK TECHNIQUE: Multidetector CT imaging of the head and neck was performed using the standard protocol during bolus administration of intravenous contrast. Multiplanar CT image reconstructions and MIPs were obtained to evaluate the vascular anatomy. Carotid stenosis measurements (when applicable) are obtained utilizing NASCET criteria, using the distal internal carotid diameter as the denominator. CONTRAST:  178m OMNIPAQUE IOHEXOL 350 MG/ML SOLN COMPARISON:  CT head 11/11/2019 FINDINGS: CTA NECK FINDINGS Aortic arch: Atherosclerotic aortic arch. Aorta mass and mildly dilated. Ascending aorta 38 mm in diameter.  Atherosclerotic disease in the proximal great vessels without significant stenosis. Right carotid system: Right common carotid artery widely patent. Atherosclerotic calcification right carotid bifurcation. Less than 25% diameter stenosis proximal right internal carotid artery. Left carotid system: Left common carotid artery widely patent. Mild atherosclerotic calcification left carotid bifurcation without significant carotid stenosis. Vertebral arteries: Both vertebral arteries patent to the basilar. Mild-to-moderate stenosis distal right vertebral artery otherwise no vertebral stenosis. Skeleton: Marked kyphoscoliosis in the thoracic spine. No acute skeletal abnormality. Left occipital craniotomy and cranioplasty. Other neck: Negative for soft tissue mass. Upper chest: Apical scarring. Patchy airspace disease in the lung apices bilaterally which could be scarring or infection. 5 mm left upper lobe nodule Review of the MIP images confirms the above findings CTA HEAD FINDINGS Anterior circulation: Atherosclerotic calcification in the cavernous carotid bilaterally. Mild to moderate stenosis in the cavernous carotid bilaterally. Anterior and middle cerebral arteries patent bilaterally without significant stenosis. Posterior circulation: Mild to moderate stenosis distal right vertebral artery. Left vertebral patent. PICA patent bilaterally. Basilar widely patent. Superior cerebellar and posterior cerebral arteries patent bilaterally. Venous sinuses: Patent Anatomic variants: None Review of the MIP images confirms the above findings IMPRESSION: 1. Atherosclerotic calcification carotid bifurcation without significant stenosis. Mild to moderate stenosis cavernous carotid bilaterally 2. Mild to moderate stenosis distal right vertebral artery. Left vertebral artery widely patent 3. No intracranial large vessel occlusion. Electronically Signed   By: CFranchot GalloM.D.   On: 11/11/2019 13:05   Dg Chest Portable 1 View   Result  Date: 11/11/2019 CLINICAL DATA:  Shortness of breath. EXAM: PORTABLE CHEST 1 VIEW COMPARISON:  Chest x-ray May 08, 2016. CT scan of the chest January 21, 2019 FINDINGS: Bilateral interstitial opacities are again identified and similar since the January 2020 CT scan. Prominence of the hila is likely due to pulmonary arterial hypertension given previous CT imaging. Stable cardiomegaly. No pneumothorax. No other acute abnormalities are identified. IMPRESSION: 1. Diffuse slightly nodular ground-glass opacities in the lungs are similar compared to January 2020 but increased compared to 2017. 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 No overt edema today. 2. Enlarged hila likely due to pulmonary arterial hypertension given previous CT imaging. Electronically Signed   By: Dorise Bullion III M.D   On: 11/11/2019 13:15    EKG: Independently reviewed.  Normal sinus rhythm.  QTC 480.  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  Acute metabolic encephalopathy: Due to severe electrolyte abnormalities.  This is started with poor appetite and frequent diarrhea.  Her diarrhea has improved.  Symptomatic treatment for nausea.  Since diarrhea has improved, that must have been transient viral illness. No focal deficit.  Head CT scans and CT angiograms were normal. Severe hypomagnesemia: Replace aggressively by mouth and IV.  Recheck frequently and replace as needed. Hypokalemia: We will replace by mouth and IV and monitor  levels.  We will also check phosphorus level.  2.  Hypertension: Blood pressures are stable.  Resume home medications.  3.  Chronic hypoxic respiratory failure due to pulmonary hypertension: Stable on 2 L oxygen.  4.  Hypothyroidism: On Synthroid replacement.  Continue.  5.  Neuropathic foot ulcer: Present on admission.  She will continue wound care when she goes home.  6.  Peripheral vascular disease: She is on Plavix that she will continue.    DVT prophylaxis: Lovenox subcu Code Status: Full code Family Communication: Husband at the bedside Disposition Plan: Home after adequate improvement Consults called: Neurology, consulted by ER, signed off Admission status: Observation telemetry.   Barb Merino MD [facility] Pager [phone]

## 2019-11-11 NOTE — ED Notes (Signed)
Spoke with lab, able to add magnesium to previously collected

## 2019-11-11 NOTE — ED Provider Notes (Signed)
[name] [name]   CSN: [id number] Arrival date & time: 11/10/19  2027     History   Chief Complaint Chief Complaint  Patient presents with  . Near Syncope    HPI [name] [name] is a 68 y.o. female.     HPI [name] [name] is a 68 year old female with a history of scleroderma with a 1 week history of diarrhea, nausea and vomiting here with episode of respiratory distress and confusion.  Patient is unable to give history.  History obtained via husband.  He reports nausea vomiting last Thursday, Friday, Saturday, Sunday which improved with Zofran after presentation to the ED on Sunday.  However, the nausea and vomiting has returned and patient returned to the ER after an episode of respiratory distress followed by confusion this evening.  Husband reports no history of TIA or stroke and that the last time she had mental confusion was in the setting of cerebellar non-Hodgkin's lymphoma.  Husband denies fevers, chills, sore throat, chest pain, cough, headache.  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  He denies any sick contacts.  Past Medical History:  Diagnosis Date  . Anemia   . Arthritis   . DOE (dyspnea on exertion)   . Epistaxis   . History of chicken pox   . Hypertension   . Hypothyroidism   . Leg ulcer ([place])   . Melanoma ([place]) 1999  . 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[place])   . Non Hodgkin's lymphoma ([place])    Treated with chemo 2010, felt to be cured.  Marland Kitchen PAH (pulmonary artery hypertension) ([place])   . Peripheral arterial occlusive disease ([place])   . Pulmonary hypertension ([place])   . Requires supplemental oxygen    2 liters-3liters when moving  . 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 Osteoporosis 01/22/2019  . Hypothyroidism 06/16/2018  . Pulmonary hypertension associated with systemic  disorder ([place]) 10/08/2017  . Dry gangrene ([place]) 09/11/2017  . Medication reaction 09/11/2017  . ILD (interstitial lung disease) ([place]) 07/22/2017  . Chronic respiratory failure ([place]) 10/31/2016  . Atherosclerosis of native arteries of right leg with ulceration of unspecified site ([place]) 09/14/2016  . Prolonged Q-T interval on ECG 07/29/2016  . CKD (chronic kidney disease), stage III 07/29/2016  . Scleroderma ([place]) 05/06/2016  . Depression 04/24/2016  . Hypokalemia 10/07/2015  . 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 Allergic rhinitis 03/08/2015  . Varicose veins of lower extremities with ulcer and inflammation ([place]) 07/24/2012  . 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[place]) 04/08/2012  . Pulmonary hypertension associated with systemic disorder ([place]) 03/25/2012  . Peripheral arterial occlusive disease ([place]) 08/10/2011  . Essential hypertension 08/10/2011  . Hyperlipidemia 08/10/2011    Past Surgical History:  Procedure Laterality Date  . AMPUTATION Right 10/24/2017   Procedure: RAY resection right index;  Surgeon: Daryll Brod, MD;  Location: [facility];  Service: Orthopedics;  Laterality: Right;  Axillary block  . AMPUTATION Right 11/21/2018   Procedure: AMPUTATION RAY RIGHT MIDDLE FINGER;  Surgeon: Daryll Brod, MD;  Location: [facility];  Service: Orthopedics;  Laterality: Right;  . AMPUTATION Right 10/22/2019   Procedure: AMPUTATION DIGIT RIGHT RING AND SMALL FINGER, DIGITAL SYMPATHECTOMY RIGHT RADIAL ULNAR ARCH;  Surgeon: Daryll Brod, MD;  Location: [facility];  Service: Orthopedics;  Laterality: Right;  AXILARY  .  apligraph  12-04-2102,   12-18-2012   bilateral calves   [name]  . biopsy on cerebellum    . COLONOSCOPY WITH PROPOFOL Left 10/07/2015   Procedure: COLONOSCOPY WITH PROPOFOL;  Surgeon: [name], MD;  Location: [facility];  Service: Endoscopy;  Laterality: Left;  . ENDARTERECTOMY FEMORAL Right 09/14/2016   Procedure:  ENDARTERECTOMY FEMORAL RIGHT;  Surgeon: [name], MD;  Location: [facility] OR;  Service: Vascular;  Laterality: Right;  . ENDOVENOUS ABLATION SAPHENOUS VEIN W/ LASER  07-24-2012   right greater saphenous vein by [name], M.D.   . ENDOVENOUS ABLATION SAPHENOUS VEIN W/ LASER  10-16-2012   left greater saphenous vein  by Dr. [name]  . ENDOVENOUS ABLATION SAPHENOUS VEIN W/ LASER Right 09-01-2015   endovenous laser ablation right greater saphenous vein by [name] MD  . ESOPHAGOGASTRODUODENOSCOPY N/A 05/07/2016   Procedure: ESOPHAGOGASTRODUODENOSCOPY (EGD);  Surgeon: [name], MD;  Location: [facility] ENDOSCOPY;  Service: Endoscopy;  Laterality: N/A;  . ESOPHAGOGASTRODUODENOSCOPY (EGD) WITH PROPOFOL Left 10/07/2015   Procedure: ESOPHAGOGASTRODUODENOSCOPY (EGD) WITH PROPOFOL;  Surgeon: [name], MD;  Location: [place] [facility];  Service: Endoscopy;  Laterality: Left;  . GIVENS CAPSULE STUDY N/A 07/31/2016   Procedure: GIVENS CAPSULE STUDY;  Surgeon: [name], MD;  Location: [facility]COPY;  Service: Endoscopy;  Laterality: N/A;  . melonoma removes Right    behind right knee  . PATCH ANGIOPLASTY Right 09/14/2016   Procedure: PATCH ANGIOPLASTY RIGHT FEMORAL ARTERY USING HEMASHIELD PLATINUM FINESSE PATCH;  Surgeon: [name], MD;  Location: [facility];  Service: Vascular;  Laterality: Right;  . PERIPHERAL VASCULAR CATHETERIZATION N/A 09/07/2016   Procedure: Abdominal Aortogram w/Lower Extremity;  Surgeon: [name], MD;  Location: [facility] CV LAB;  Service: Cardiovascular;  Laterality: N/A;  . RIGHT HEART CATH N/A 04/19/2017   Procedure: Right Heart Cath;  Surgeon: [name], MD;  Location: [place] [facility] CV LAB;  Service: Cardiovascular;  Laterality: N/A;  . RIGHT HEART CATHETERIZATION N/A 11/14/2012   Procedure: RIGHT HEART CATH;  Surgeon: [name], MD;  Location[facility] C[facility] LAB;  Service: Cardiovascular;  Laterality: N/A;  . rt little toe removal  10/1998  . stent left thigh  3/11  .  TENDON TRANSFER Right 10/24/2017   Procedure: transfer extensor indecus propius/extensor digitorum commomus index to middle ring and small;  Surgeon: Daryll Brod, MD;  Location: [facility];  Service: Orthopedics;  Laterality: Right;  . TONSILLECTOMY    . TONSILLECTOMY AND ADENOIDECTOMY    . TUNNELED VENOUS CATHETER PLACEMENT  01/2013  . ULNAR HEAD EXCISION Right 10/24/2017   Procedure: darrach right ulna;  Surgeon: Daryll Brod, MD;  Location: [facility];  Service: Orthopedics;  Laterality: Right;  . VEIN LIGATION AND STRIPPING  05/16/2012   Procedure: VEIN LIGATION AND STRIPPING;  Surgeon: [name], MD;  Location: [facility] OR;  Service: Vascular;  Laterality: Right;  Irrigation and Debridement right lower leg, ligation of saphenous vein.     OB History   No obstetric history on file.    Home Medications    Prior to Admission medications   Medication Sig Start Date End Date Taking? Authorizing Provider  acetaminophen (TYLENOL ARTHRITIS PAIN) 650 MG CR tablet Take 1,300 mg by mouth every 8 (eight) hours.     [provider]  atorvastatin (LIPITOR) 20 MG tablet Take 1 tablet (20 mg total) by mouth daily. 06/18/19   [name], MD  Biotin 5000 MCG TABS Take 5,000 mcg  by mouth daily.    [provider]  CALCIUM-MAGNESIUM-VITAMIN D PO Take 2 tablets by mouth daily.    [provider]  Cholecalciferol (VITAMIN D) 2000 units CAPS Take 2,000 Units by mouth daily.    [provider]  citalopram (CELEXA) 20 MG tablet TAKE 1 TABLET BY MOUTH EVERYDAY AT BEDTIME Patient taking differently: Take 20 mg by mouth daily.  07/11/19   [name], MD  clopidogrel (PLAVIX) 75 MG tablet TAKE 1 TABLET BY MOUTH EVERY DAY 03/11/19   Larey Dresser, MD  FERREX 150 150 MG capsule TAKE ONE CAPSULE BY MOUTH TWICE A DAY 01/26/19   [name], MD  fish oil-omega-3 fatty acids 1000 MG capsule Take 1,000 mg by mouth 2 (two) times daily.     [provider]  gabapentin (NEURONTIN)  300 MG capsule Take 600 mg by mouth 2 (two) times daily.    [provider]  HYDROcodone-acetaminophen (NORCO) 5-325 MG tablet Take 1 tablet by mouth every 6 (six) hours as needed for moderate pain. 10/22/19   Daryll Brod, MD  levothyroxine (SYNTHROID) 50 MCG tablet TAKE 1 TABLET BY MOUTH DAILY BEFORE BREAKFAST Patient taking differently: Take 50 mcg by mouth daily before breakfast.  09/09/19   [name], MD  Multiple Vitamin (MULTIVITAMIN WITH MINERALS) TABS tablet Take 1 tablet by mouth daily.    [provider]  NIFEdipine (ADALAT CC) 30 MG 24 hr tablet Take 30 mg by mouth daily.     [provider]  ondansetron (ZOFRAN ODT) 4 MG disintegrating tablet Take 1 tablet (4 mg total) by mouth every 8 (eight) hours as needed for nausea or vomiting. 11/08/19   Virgel Manifold, MD  ondansetron (ZOFRAN) 4 MG tablet Take 1 tablet (4 mg total) by mouth every 8 (eight) hours as needed for nausea or vomiting. 11/10/19   [name], MD  OPSUMIT 10 MG tablet Take 1 tablet (76m) by mouth daily. 04/10/19   Bensimhon, DShaune Pascal MD  OXYGEN Inhale 2-3 L into the lungs continuous.     [provider]  pantoprazole (PROTONIX) 40 MG tablet Take 1 tablet (40 mg total) by mouth 2 (two) times daily. 05/08/16   DRobbie Lis MD  Probiotic CAPS Take 1 capsule by mouth daily.    [provider]  RESTASIS 0.05 % ophthalmic emulsion Place 1 drop into both eyes 2 (two) times daily. 08/28/19   [provider]  sildenafil (REVATIO) 20 MG tablet Take 3 tablets (60 mg total) by mouth 3 (three) times daily. 10/28/19   Bensimhon, DShaune Pascal MD  spironolactone (ALDACTONE) 25 MG tablet TAKE 1 TABLET BY MOUTH EVERY DAY 08/17/19   Bensimhon, DShaune Pascal MD  torsemide (DEMADEX) 20 MG tablet Take 419min the AM and 2065mn the PM 10/05/19   Bensimhon, DanShaune PascalD  valACYclovir (VALTREX) 500 MG tablet Take 1 tablet (500 mg total) by mouth 2 (two) times daily. Patient taking differently:  Take 500 mg by mouth 2 (two) times daily as needed (cold sores).  09/02/19   BaxElby ShowersD  vitamin B-12 (CYANOCOBALAMIN) 1000 MCG tablet Take 1,000 mcg by mouth daily.    [provider]    Family History Family History  Problem Relation Age of Onset  . Lupus Father   . Lymphoma Father   . Hyperlipidemia Mother   . Cancer Daughter        sarcoma  . Colon cancer Other  Social History Social History   Tobacco Use  . Smoking status: Never Smoker  . Smokeless tobacco: Never Used  Substance Use Topics  . Alcohol use: Yes    Alcohol/week: 0.0 standard drinks    Comment: 1-2 drinks per day wine.liquor or beer  . Drug use: No     Allergies   Levofloxacin, Sulfa antibiotics, Levofloxacin in d5w, Doxycycline hyclate, and Penicillins   Review of Systems Review of Systems  Constitutional: Negative for chills and fatigue.  HENT: Negative for sore throat.   Respiratory: Positive for shortness of breath.   Cardiovascular: Negative for chest pain.  Gastrointestinal: Positive for diarrhea, nausea and vomiting. Negative for abdominal distention.  Genitourinary:       Decreased urination  Musculoskeletal:       Recent right ring and small finger amputation  Skin:       2 ulcers on right lower extremity  Neurological:       Near syncope  Psychiatric/Behavioral: Positive for confusion.  All other systems reviewed and are negative.  Physical Exam Updated Vital Signs BP (!) 134/55 (BP Location: Right Arm)   Pulse 71   Temp 99.1 F (37.3 C) (Oral)   Resp 16   SpO2 100%   Physical Exam Constitutional:      Comments: Ill-appearing female lying in bed  HENT:     Head: Normocephalic and atraumatic.  Eyes:     Conjunctiva/sclera: Conjunctivae normal.  Neck:     Musculoskeletal: Normal range of motion.  Cardiovascular:     Rate and Rhythm: Normal rate and regular rhythm.     Heart sounds: No murmur.  Pulmonary:     Effort: Pulmonary effort is normal.      Breath sounds: Rhonchi (left side) present.  Abdominal:     General: Abdomen is flat. Bowel sounds are normal.     Palpations: Abdomen is soft.  Musculoskeletal:     Comments: Jerky upper extremities; bandage on right lower extremity  Skin:    Comments: Distal extremities cool to touch  Neurological:     Comments: Patient alert but not oriented; does not speak or follow commands    ED Treatments / Results  Labs (all labs ordered are listed, but only abnormal results are displayed) Labs Reviewed  BASIC METABOLIC PANEL - Abnormal; Notable for the following components:      Result Value   Sodium 133 (*)    Potassium 3.0 (*)    Chloride 96 (*)    Glucose, Bld 120 (*)    Calcium 6.7 (*)    GFR calc non Af Amer 58 (*)    All other components within normal limits  CBC - Abnormal; Notable for the following components:   RBC 3.35 (*)    Hemoglobin 11.0 (*)    HCT 35.2 (*)    MCV 105.1 (*)    All other components within normal limits  URINALYSIS, ROUTINE W REFLEX MICROSCOPIC - Abnormal; Notable for the following components:   APPearance HAZY (*)    Hgb urine dipstick SMALL (*)    Protein, ur 100 (*)    Leukocytes,Ua MODERATE (*)    Bacteria, UA RARE (*)    Non Squamous Epithelial 0-5 (*)    All other components within normal limits  SARS CORONAVIRUS 2 (TAT 6-24 HRS)  HEPATIC FUNCTION PANEL  CBG MONITORING, ED  TROPONIN I (HIGH SENSITIVITY)    EKG EKG Interpretation  Date/Time:  Tuesday November 10 2019 21:06:46 EST Ventricular Rate:  74  PR Interval:  142 QRS Duration: 86 QT Interval:  434 QTC Calculation: 481 R Axis:   106 Text Interpretation: Normal sinus rhythm Possible Right ventricular hypertrophy Nonspecific ST abnormality Abnormal ECG When compared with ECG of 10/22/2019, No significant change was found Confirmed by Delora Fuel ([id number]) on 11/11/2019 1:11:34 AM   Radiology No results found.  Procedures Procedures (including critical care time)  Medications  Ordered in ED Medications  sodium chloride flush (NS) 0.9 % injection 3 mL (has no administration in time range)  sodium chloride 0.9 % bolus 1,000 mL (has no administration in time range)     Initial Impression / Assessment and Plan / ED Course  I have reviewed the triage vital signs and the nursing notes.  Pertinent labs & imaging results that were available during my care of the patient were reviewed by me and considered in my medical decision making (see chart for details).        Ms. Trzcinski is a 69 year old female with a past medical history of scleroderma and 1 week of nausea vomiting who presents after sudden onset dyspnea and confusion last evening around 7 PM who has a concerning neuro exam as patient unable to speak or follow commands.  Given acute onset of symptoms, concern is for stroke, for which she is getting CT head without right now.  Neuro has been consulted.  Patient has history of non-Hodgkin's lymphoma in her cerebellum at which time she had similar symptoms.  This could be a recurrence, however, the sudden onset makes this less likely.  CT head will give [place] more information.  Sudden onset dyspnea is concerning for PE, however patient not tachycardic and is satting at 100% on room air.  Dyspnea and nausea vomiting in a female who has likely been unable to keep down her chronic medications which include Plavix is concerning for ACS, thus her troponin was drawn.  No obvious ischemic changes on EKG.  Given dyspnea and nausea vomiting can be symptoms of COVID-19, will obtain chest x-ray and Covid test to evaluate.  Nausea vomiting may be secondary to Covid for which we are getting testing as above or it could be infectious for which PCP has already obtained C. difficile and O&P studies.  She is C. difficile negative and the O&P has not come back.  Will obtain hepatic function tests to evaluate for hepatitides.  Patient quite dehydrated for which she is receiving a liter  bolus of normal saline.  Will likely require admission..  Other treatment pending CT head results and neurology opinion.  We will follow up additional lab work and imaging.  Final Clinical Impressions(s) / ED Diagnoses   Final diagnoses:  None  CT head unremarkable.  Seen by neurology who thinks this may be metabolic encephalopathy in setting of electrolyte imbalances secondary to dehydration.  Obtain CT angio head and neck per neurology.  Will replete electrolytes and admit to triad.  ED Discharge Orders    None       Al Decant, MD 11/11/19 1312    Isla Pence, MD 11/11/19 1539

## 2019-11-11 NOTE — ED Notes (Signed)
Date and time results received: 11/11/19  (use smartphrase ".now" to insert current time)  Test: magnesium Critical Value: 0.6  Name of Provider Notified: hospitalist

## 2019-11-11 NOTE — Progress Notes (Signed)
When given medication, pt spit the pills out. On call provider made aware of pt refusal to take medication. RN will continue to monitor.

## 2019-11-11 NOTE — Progress Notes (Signed)
EEG complete - results pending.

## 2019-11-11 NOTE — Consult Note (Signed)
Reason for Consult:code stroke Referring Physician: Dr Annitta Needs is an 68 y.o. female.  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  She came to the ER and was found to be sleepy and gradually improved.  She spent overnight in the ER and this morning she had recurrence of her lethargy and altered mental status and a code stroke was called.  Patient has had fluctuating mental status as per the husband.  She recently undergone right finger amputations due to complications from scleroderma 2 weeks ago and has not done well since then.  She was on narcotics for pain which cause constipation.  She was seen in the ER few days ago for dehydration and nausea and diarrhea.  Husband admits that she is dehydrated and has not been eating well.  The patient denied any dysarthria or focal extremity weakness numbness gait or balance problems.  There is no prior history of strokes or TIA.  CT scan of the head done on admission shows no acute abnormality.  Small craniotomy defect in the occipital bone from prior biopsy for lymphoma.  Lab work show significant metabolic abnormalities with low potassium of 3.0, low magnesium 0.6 and calcium of 6.7.    Past Medical History:  Diagnosis Date  . Anemia   . Arthritis   . DOE (dyspnea on exertion)   . Epistaxis   . History of chicken pox   . Hypertension   . Hypothyroidism   . Leg ulcer ([place])   . Melanoma ([place]) 1999  . 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[place])   . Non Hodgkin's lymphoma ([place])    Treated with chemo 2010, felt to be cured.  Marland Kitchen PAH (pulmonary artery hypertension) ([place])   . Peripheral arterial occlusive disease ([place])   . Pulmonary hypertension ([place])   .  Requires supplemental oxygen    2 liters-3liters when moving  . 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 AMPUTATION Right 10/24/2017   Procedure: RAY resection right index;  Surgeon: Daryll Brod, MD;  Location: [place];  Service: Orthopedics;  Laterality: Right;  Axillary block  . AMPUTATION Right 11/21/2018   Procedure: AMPUTATION RAY RIGHT MIDDLE FINGER;  Surgeon: Daryll Brod, MD;  Location: [place];  Service: Orthopedics;  Laterality: Right;  . AMPUTATION Right 10/22/2019   Procedure: AMPUTATION DIGIT RIGHT RING AND SMALL FINGER, DIGITAL SYMPATHECTOMY RIGHT RADIAL ULNAR ARCH;  Surgeon: Daryll Brod, MD;  Location: [place];  Service: Orthopedics;  Laterality: Right;  AXILARY  . apligraph  12-04-2102,   12-18-2012   bilateral calves   [name]  . biopsy on cerebellum    . COLONOSCOPY WITH PROPOFOL Left 10/07/2015   Procedure: COLONOSCOPY WITH PROPOFOL;  Surgeon: [name], MD;  Location: [place];  Service: Endoscopy;  Laterality: Left;  . ENDARTERECTOMY FEMORAL Right 09/14/2016   Procedure: ENDARTERECTOMY FEMORAL RIGHT;  Surgeon: [name], MD;  Location[facility];  Service: Vascular;  Laterality: Right;  . ENDOVENOUS ABLATION SAPHENOUS VEIN W/ LASER  07-24-2012   right greater saphenous vein by [name], M.D.   . ENDOVENOUS ABLATION SAPHENOUS VEIN W/ LASER  10-16-2012   left greater saphenous vein  by Dr.  Early  . ENDOVENOUS ABLATION SAPHENOUS VEIN W/ LASER Right 09-01-2015   endovenous laser ablation right greater saphenous vein by [name] MD  . ESOPHAGOGASTRODUODENOSCOPY N/A 05/07/2016   Procedure: ESOPHAGOGASTRODUODENOSCOPY (EGD);  Surgeon: [name], MD;  Location: [facility] ENDOSCOPY;  Service: Endoscopy;  Laterality: N/A;  . ESOPHAGOGASTRODUODENOSCOPY (EGD) WITH PROPOFOL Left 10/07/2015   Procedure: ESOPHAGOGASTRODUODENOSCOPY (EGD) WITH PROPOFOL;  Surgeon: [name], MD;  Location: [place];  Service: Endoscopy;  Laterality: Left;  . GIVENS CAPSULE STUDY N/A 07/31/2016   Procedure: GIVENS CAPSULE STUDY;  Surgeon: [name], MD;  Location: [facility] ENDOSCOPY;  Service: Endoscopy;  Laterality: N/A;  . melonoma removes Right    behind right knee  . PATCH ANGIOPLASTY Right 09/14/2016   Procedure: PATCH ANGIOPLASTY RIGHT FEMORAL ARTERY USING HEMASHIELD PLATINUM FINESSE PATCH;  Surgeon: [name], MD;  Location: [place];  Service: Vascular;  Laterality: Right;  . PERIPHERAL VASCULAR CATHETERIZATION N/A 09/07/2016   Procedure: Abdominal Aortogram w/Lower Extremity;  Surgeon: [name], MD;  Location: [place] CV LAB;  Service: Cardiovascular;  Laterality: N/A;  . RIGHT HEART CATH N/A 04/19/2017   Procedure: Right Heart Cath;  Surgeon: [name], MD;  Location: [place] CV LAB;  Service: Cardiovascular;  Laterality: N/A;  . RIGHT HEART CATHETERIZATION N/A 11/14/2012   Procedure: RIGHT HEART CATH;  Surgeon: [name], MD;  Location: [facility] CATH LAB;  Service: Cardiovascular;  Laterality: N/A;  . rt little toe removal  10/1998  . stent left thigh  3/11  . TENDON TRANSFER Right 10/24/2017   Procedure: transfer extensor indecus propius/extensor digitorum commomus index to middle ring and small;  Surgeon: Daryll Brod, MD;  Location: [place];  Service: Orthopedics;  Laterality: Right;  . TONSILLECTOMY    . TONSILLECTOMY AND ADENOIDECTOMY    . TUNNELED VENOUS CATHETER PLACEMENT  01/2013  . ULNAR HEAD EXCISION Right 10/24/2017   Procedure: darrach right ulna;  Surgeon: Daryll Brod, MD;  Location: [place];  Service: Orthopedics;  Laterality: Right;  . VEIN LIGATION AND STRIPPING  05/16/2012   Procedure: VEIN LIGATION AND STRIPPING;  Surgeon: [name], MD;  Location: [facility] OR;  Service: Vascular;  Laterality: Right;  Irrigation and Debridement right lower leg, ligation of saphenous vein.    Family History  Problem Relation Age of Onset  . Lupus Father   . Lymphoma  Father   . Hyperlipidemia Mother   . Cancer Daughter        sarcoma  . Colon cancer Other     Social History:  reports that she has never smoked. She has never used smokeless tobacco. She reports current alcohol use. She reports that she does not use drugs.  Allergies:  Allergies  Allergen Reactions  . Levofloxacin Other (See Comments)    FLU LIKE SYMPTOM Other reaction(s): Other (See Comments) FLU LIKE SYMPTOM  . Sulfa Antibiotics Rash and Other (See Comments)    Face peeled Severe rash with significant peeling of face. No airway involvement per patient Face peeled  . Levofloxacin In D5w Other (See Comments)    FLU LIKE SYMPTOMS FLU LIKE SYMPTOMS  . Doxycycline Hyclate Swelling    SWELLING REACTION UNSPECIFIED  "TABLETS ONLY - CAPSULES ARE TOLERATED FINE"  . Penicillins Rash    Has patient had a PCN reaction causing immediate rash, facial/tongue/throat swelling, SOB or lightheadedness with hypotension: no Has patient had a PCN reaction causing severe rash involving mucus membranes or skin necrosis: no Has patient had a PCN  reaction that required hospitalization: no Has patient had a PCN reaction occurring within the last 10 years: {no If all of the above answers are "NO", then may proceed with Cephalosporin use.    Medications: I have reviewed the patient's current medications.  Results for orders placed or performed during the hospital encounter of 11/10/19 (from the past 48 hour(s))  Urinalysis, Routine w reflex microscopic     Status: Abnormal   Collection Time: 11/10/19  9:02 PM  Result Value Ref Range   Color, Urine YELLOW YELLOW   APPearance HAZY (A) CLEAR   Specific Gravity, Urine 1.013 1.005 - 1.030   pH 5.0 5.0 - 8.0   Glucose, UA NEGATIVE NEGATIVE mg/dL   Hgb urine dipstick SMALL (A) NEGATIVE   Bilirubin Urine NEGATIVE NEGATIVE   Ketones, ur NEGATIVE NEGATIVE mg/dL   Protein, ur 100 (A) NEGATIVE mg/dL   Nitrite NEGATIVE NEGATIVE   Leukocytes,Ua MODERATE  (A) NEGATIVE   RBC / HPF 0-5 0 - 5 RBC/hpf   WBC, UA 21-50 0 - 5 WBC/hpf   Bacteria, UA RARE (A) NONE SEEN   Squamous Epithelial / LPF 0-5 0 - 5   Mucus PRESENT    Non Squamous Epithelial 0-5 (A) NONE SEEN    Comment: Performed at [facility], [place] [address] [place], [place] [address]  Basic metabolic panel     Status: Abnormal   Collection Time: 11/10/19  9:04 PM  Result Value Ref Range   Sodium 133 (L) 135 - 145 mmol/L   Potassium 3.0 (L) 3.5 - 5.1 mmol/L   Chloride 96 (L) 98 - 111 mmol/L   CO2 24 22 - 32 mmol/L   Glucose, Bld 120 (H) 70 - 99 mg/dL   BUN 11 8 - 23 mg/dL   Creatinine, Ser 0.98 0.44 - 1.00 mg/dL   Calcium 6.7 (L) 8.9 - 10.3 mg/dL   GFR calc non Af Amer 58 (L) >60 mL/min   GFR calc Af Amer >60 >60 mL/min   Anion gap 13 5 - 15    Comment: Performed at [facility] [address], [place], [place] [address]  CBC     Status: Abnormal   Collection Time: 11/10/19  9:04 PM  Result Value Ref Range   WBC 8.5 4.0 - 10.5 K/uL   RBC 3.35 (L) 3.87 - 5.11 MIL/uL   Hemoglobin 11.0 (L) 12.0 - 15.0 g/dL   HCT 35.2 (L) 36.0 - 46.0 %   MCV 105.1 (H) 80.0 - 100.0 fL   MCH 32.8 26.0 - 34.0 pg   MCHC 31.3 30.0 - 36.0 g/dL   RDW 13.4 11.5 - 15.5 %   Platelets 225 150 - 400 K/uL   nRBC 0.0 0.0 - 0.2 %    Comment: Performed at [facility], [place] [address] [place], [place] [address]  Troponin I (High Sensitivity)     Status: Abnormal   Collection Time: 11/11/19 12:03 PM  Result Value Ref Range   Troponin I (High Sensitivity) 30 (H) <18 ng/L    Comment: (NOTE) Elevated high sensitivity troponin I (hsTnI) values and significant  changes across serial measurements may suggest ACS but many other  chronic and acute conditions are known to elevate hsTnI results.  Refer to the "Links" section for chest pain algorithms and additional  guidance. Performed at [facility], [place] [address], [place],  [address]   Hepatic function panel     Status:  Abnormal  Collection Time: 11/11/19 12:03 PM  Result Value Ref Range   Total Protein 6.8 6.5 - 8.1 g/dL   Albumin 3.0 (L) 3.5 - 5.0 g/dL   AST 40 15 - 41 U/L   ALT 26 0 - 44 U/L   Alkaline Phosphatase 78 38 - 126 U/L   Total Bilirubin 1.3 (H) 0.3 - 1.2 mg/dL   Bilirubin, Direct 0.3 (H) 0.0 - 0.2 mg/dL   Indirect Bilirubin 1.0 (H) 0.3 - 0.9 mg/dL    Comment: Performed at [facility] [address] [place], [place] [address]  Magnesium     Status: Abnormal   Collection Time: 11/11/19 12:03 PM  Result Value Ref Range   Magnesium 0.6 (LL) 1.7 - 2.4 mg/dL    Comment: CRITICAL RESULT CALLED TO, READ BACK BY AND VERIFIED WITH: Gertha Calkin RN [phone] [id number] BY A BENNETT Performed at [facility], [place] [address], [place], [place] [address]     Ct Angio Head W Or Wo Contrast  Result Date: 11/11/2019 CLINICAL DATA:  Slurred speech and weakness.  Rule out stroke EXAM: CT ANGIOGRAPHY HEAD AND NECK TECHNIQUE: Multidetector CT imaging of the head and neck was performed using the standard protocol during bolus administration of intravenous contrast. Multiplanar CT image reconstructions and MIPs were obtained to evaluate the vascular anatomy. Carotid stenosis measurements (when applicable) are obtained utilizing NASCET criteria, using the distal internal carotid diameter as the denominator. CONTRAST:  150m OMNIPAQUE IOHEXOL 350 MG/ML SOLN COMPARISON:  CT head 11/11/2019 FINDINGS: CTA NECK FINDINGS Aortic arch: Atherosclerotic aortic arch. Aorta mass and mildly dilated. Ascending aorta 38 mm in diameter. Atherosclerotic disease in the proximal great vessels without significant stenosis. Right carotid system: Right common carotid artery widely patent. Atherosclerotic calcification right carotid bifurcation. Less than 25% diameter stenosis proximal right internal carotid artery. Left carotid system: Left common carotid artery widely patent. Mild atherosclerotic calcification left carotid  bifurcation without significant carotid stenosis. Vertebral arteries: Both vertebral arteries patent to the basilar. Mild-to-moderate stenosis distal right vertebral artery otherwise no vertebral stenosis. Skeleton: Marked kyphoscoliosis in the thoracic spine. No acute skeletal abnormality. Left occipital craniotomy and cranioplasty. Other neck: Negative for soft tissue mass. Upper chest: Apical scarring. Patchy airspace disease in the lung apices bilaterally which could be scarring or infection. 5 mm left upper lobe nodule Review of the MIP images confirms the above findings CTA HEAD FINDINGS Anterior circulation: Atherosclerotic calcification in the cavernous carotid bilaterally. Mild to moderate stenosis in the cavernous carotid bilaterally. Anterior and middle cerebral arteries patent bilaterally without significant stenosis. Posterior circulation: Mild to moderate stenosis distal right vertebral artery. Left vertebral patent. PICA patent bilaterally. Basilar widely patent. Superior cerebellar and posterior cerebral arteries patent bilaterally. Venous sinuses: Patent Anatomic variants: None Review of the MIP images confirms the above findings IMPRESSION: 1. Atherosclerotic calcification carotid bifurcation without significant stenosis. Mild to moderate stenosis cavernous carotid bilaterally 2. Mild to moderate stenosis distal right vertebral artery. Left vertebral artery widely patent 3. No intracranial large vessel occlusion. Electronically Signed   By: CFranchot GalloM.D.   On: 11/11/2019 13:05   Ct Head Wo Contrast  Result Date: 11/11/2019 CLINICAL DATA:  Altered mental status. EXAM: CT HEAD WITHOUT CONTRAST TECHNIQUE: Contiguous axial images were obtained from the base of the skull through the vertex without intravenous contrast. COMPARISON:  Brain MRI 11/29/2017 FINDINGS: Brain: Ventricles, cisterns and other CSF spaces are within normal. There is mild chronic ischemic microvascular disease. Stable  focus of  encephalomalacia over the right cerebellar hemisphere. No mass, mass effect, shift of midline structures or acute hemorrhage. No evidence of acute infarction. Vascular: No hyperdense vessel or unexpected calcification. Skull: Previous right occipital craniotomy unchanged. Sinuses/Orbits: No acute finding. Other: None. IMPRESSION: 1.  No acute findings. 2. Mild chronic ischemic microvascular disease. Postsurgical change right cerebellar hemisphere with postsurgical change of the right occipital skull. Electronically Signed   By: Marin Olp M.D.   On: 11/11/2019 11:57   Ct Angio Neck W Or Wo Contrast  Result Date: 11/11/2019 CLINICAL DATA:  Slurred speech and weakness.  Rule out stroke EXAM: CT ANGIOGRAPHY HEAD AND NECK TECHNIQUE: Multidetector CT imaging of the head and neck was performed using the standard protocol during bolus administration of intravenous contrast. Multiplanar CT image reconstructions and MIPs were obtained to evaluate the vascular anatomy. Carotid stenosis measurements (when applicable) are obtained utilizing NASCET criteria, using the distal internal carotid diameter as the denominator. CONTRAST:  161m OMNIPAQUE IOHEXOL 350 MG/ML SOLN COMPARISON:  CT head 11/11/2019 FINDINGS: CTA NECK FINDINGS Aortic arch: Atherosclerotic aortic arch. Aorta mass and mildly dilated. Ascending aorta 38 mm in diameter. Atherosclerotic disease in the proximal great vessels without significant stenosis. Right carotid system: Right common carotid artery widely patent. Atherosclerotic calcification right carotid bifurcation. Less than 25% diameter stenosis proximal right internal carotid artery. Left carotid system: Left common carotid artery widely patent. Mild atherosclerotic calcification left carotid bifurcation without significant carotid stenosis. Vertebral arteries: Both vertebral arteries patent to the basilar. Mild-to-moderate stenosis distal right vertebral artery otherwise no vertebral  stenosis. Skeleton: Marked kyphoscoliosis in the thoracic spine. No acute skeletal abnormality. Left occipital craniotomy and cranioplasty. Other neck: Negative for soft tissue mass. Upper chest: Apical scarring. Patchy airspace disease in the lung apices bilaterally which could be scarring or infection. 5 mm left upper lobe nodule Review of the MIP images confirms the above findings CTA HEAD FINDINGS Anterior circulation: Atherosclerotic calcification in the cavernous carotid bilaterally. Mild to moderate stenosis in the cavernous carotid bilaterally. Anterior and middle cerebral arteries patent bilaterally without significant stenosis. Posterior circulation: Mild to moderate stenosis distal right vertebral artery. Left vertebral patent. PICA patent bilaterally. Basilar widely patent. Superior cerebellar and posterior cerebral arteries patent bilaterally. Venous sinuses: Patent Anatomic variants: None Review of the MIP images confirms the above findings IMPRESSION: 1. Atherosclerotic calcification carotid bifurcation without significant stenosis. Mild to moderate stenosis cavernous carotid bilaterally 2. Mild to moderate stenosis distal right vertebral artery. Left vertebral artery widely patent 3. No intracranial large vessel occlusion. Electronically Signed   By: CFranchot GalloM.D.   On: 11/11/2019 13:05   Dg Chest Portable 1 View  Result Date: 11/11/2019 CLINICAL DATA:  Shortness of breath. EXAM: PORTABLE CHEST 1 VIEW COMPARISON:  Chest x-ray May 08, 2016. CT scan of the chest January 21, 2019 FINDINGS: Bilateral interstitial opacities are again identified and similar since the January 2020 CT scan. Prominence of the hila is likely due to pulmonary arterial hypertension given previous CT imaging. Stable cardiomegaly. No pneumothorax. No other acute abnormalities are identified. IMPRESSION: 1. Diffuse slightly nodular ground-glass opacities in the lungs are similar compared to January 2020 but increased  compared to 2017. 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 No overt edema today. 2. Enlarged hila likely due to pulmonary arterial hypertension given previous CT imaging. Electronically Signed   By: DDorise BullionIII M.D   On: 11/11/2019 13:15   CT-scan of the brain MRI examination of  the brain.   CT-scan of the brain  Ultrasound   ROS 14 system review of systems is positive for above symptoms documented in history of presenting illness. Blood pressure (!) 144/52, pulse 83, temperature 100.1 F (37.8 C), temperature source Rectal, resp. rate 16, SpO2 100 %. Physical Exam Frail malnourished looking elderly Caucasian lady is not in distress but appears to be lethargic. . Afebrile. Head is nontraumatic. Neck is supple without bruit.    Cardiac exam no murmur or gallop. Lungs are clear to auscultation. Distal pulses are well felt.  She has postsurgical bandage in the right hand as well as left lower extremity. Neurological Exam: She is lethargic sleepy but can be aroused and follows simple commands.  Eyes in primary position.  She follows gaze in all directions.  Pupils equal reactive.  Fundi not visualized.  She blinks to threat bilaterally.  Speech appears clear without dysarthria or aphasia.  She follows simple midline and commands in all extremities well.  There is no upper or lower extremity drift.  There appears no focal weakness though her motor strength testing is limited due to poor cooperation.  She has pain in the right hand over her surgical site.  Deep tendon reflexes are depressed.  Plantars are downgoing.  Gait not tested.  Assessment/Plan: 68 year old lady with altered mental status fluctuating level of consciousness and transient speech difficulties possibly related to metabolic encephalopathy.  Doubt stroke due to lack of definite focal symptoms or signs. Recommend correction of reversible metabolic  parameters replace potassium, magnesium protein supplementation for monitor nutrition.  IV hydration.  Check EEG for seizure activity.  If mental status does not improve may consider MRI scan of the brain later if necessary.  Neuro hospitalist team can follow in the future for neurological follow-up and no stroke follow-up is necessary.  Discussed with patient and her husband and Dr. Gilford Raid and answered questions.  Greater than 50% time during this 80-minute consultation visit was spent on counseling and coordination of care about her speech difficulties fluctuating mental status and metabolic abnormalities and discussion with care team I have personally obtained history,examined this patient, reviewed notes, independently viewed imaging studies, participated in medical decision making and plan of care.ROS completed by me personally and pertinent positives fully documented  I have made any additions or clarifications directly to the above note.   Antony Contras, MD Medical Director [facility] Stroke [facility] Pager: [phone] 11/11/2019 4:30 PM  Antony Contras 11/11/2019, 4:21 PM    Note: This document was prepared with digital dictation and possible smart phrase technology. Any transcriptional errors that result from this process are unintentional.

## 2019-11-12 ENCOUNTER — Ambulatory Visit: Payer: Medicare Other | Admitting: Internal Medicine

## 2019-11-12 DIAGNOSIS — E878 Other disorders of electrolyte and fluid balance, not elsewhere classified: Secondary | ICD-10-CM | POA: Diagnosis present

## 2019-11-12 DIAGNOSIS — M349 Systemic sclerosis, unspecified: Secondary | ICD-10-CM | POA: Diagnosis present

## 2019-11-12 DIAGNOSIS — E039 Hypothyroidism, unspecified: Secondary | ICD-10-CM | POA: Diagnosis present

## 2019-11-12 DIAGNOSIS — L97509 Non-pressure chronic ulcer of other part of unspecified foot with unspecified severity: Secondary | ICD-10-CM | POA: Diagnosis present

## 2019-11-12 DIAGNOSIS — Z9981 Dependence on supplemental oxygen: Secondary | ICD-10-CM | POA: Diagnosis not present

## 2019-11-12 DIAGNOSIS — M3481 Systemic sclerosis with lung involvement: Secondary | ICD-10-CM | POA: Diagnosis present

## 2019-11-12 DIAGNOSIS — I10 Essential (primary) hypertension: Secondary | ICD-10-CM

## 2019-11-12 DIAGNOSIS — F329 Major depressive disorder, single episode, unspecified: Secondary | ICD-10-CM | POA: Diagnosis present

## 2019-11-12 DIAGNOSIS — R4182 Altered mental status, unspecified: Secondary | ICD-10-CM | POA: Diagnosis present

## 2019-11-12 DIAGNOSIS — Z66 Do not resuscitate: Secondary | ICD-10-CM | POA: Diagnosis present

## 2019-11-12 DIAGNOSIS — E785 Hyperlipidemia, unspecified: Secondary | ICD-10-CM | POA: Diagnosis present

## 2019-11-12 DIAGNOSIS — I739 Peripheral vascular disease, unspecified: Secondary | ICD-10-CM | POA: Diagnosis present

## 2019-11-12 DIAGNOSIS — I8393 Asymptomatic varicose veins of bilateral lower extremities: Secondary | ICD-10-CM | POA: Diagnosis present

## 2019-11-12 DIAGNOSIS — J9611 Chronic respiratory failure with hypoxia: Secondary | ICD-10-CM

## 2019-11-12 DIAGNOSIS — K8020 Calculus of gallbladder without cholecystitis without obstruction: Secondary | ICD-10-CM | POA: Diagnosis not present

## 2019-11-12 DIAGNOSIS — Z20828 Contact with and (suspected) exposure to other viral communicable diseases: Secondary | ICD-10-CM | POA: Diagnosis present

## 2019-11-12 DIAGNOSIS — R197 Diarrhea, unspecified: Secondary | ICD-10-CM | POA: Diagnosis not present

## 2019-11-12 DIAGNOSIS — G9341 Metabolic encephalopathy: Principal | ICD-10-CM

## 2019-11-12 DIAGNOSIS — J449 Chronic obstructive pulmonary disease, unspecified: Secondary | ICD-10-CM | POA: Diagnosis not present

## 2019-11-12 DIAGNOSIS — Z832 Family history of diseases of the blood and blood-forming organs and certain disorders involving the immune mechanism: Secondary | ICD-10-CM | POA: Diagnosis not present

## 2019-11-12 DIAGNOSIS — I2721 Secondary pulmonary arterial hypertension: Secondary | ICD-10-CM | POA: Diagnosis present

## 2019-11-12 DIAGNOSIS — E876 Hypokalemia: Secondary | ICD-10-CM | POA: Diagnosis present

## 2019-11-12 DIAGNOSIS — D849 Immunodeficiency, unspecified: Secondary | ICD-10-CM | POA: Diagnosis present

## 2019-11-12 DIAGNOSIS — E860 Dehydration: Secondary | ICD-10-CM | POA: Diagnosis present

## 2019-11-12 LAB — CBC
HCT: 30.8 % — ABNORMAL LOW (ref 36.0–46.0)
Hemoglobin: 9.5 g/dL — ABNORMAL LOW (ref 12.0–15.0)
MCH: 33 pg (ref 26.0–34.0)
MCHC: 30.8 g/dL (ref 30.0–36.0)
MCV: 106.9 fL — ABNORMAL HIGH (ref 80.0–100.0)
Platelets: 219 10*3/uL (ref 150–400)
RBC: 2.88 MIL/uL — ABNORMAL LOW (ref 3.87–5.11)
RDW: 13.3 % (ref 11.5–15.5)
WBC: 9.1 10*3/uL (ref 4.0–10.5)
nRBC: 0 % (ref 0.0–0.2)

## 2019-11-12 LAB — COMPREHENSIVE METABOLIC PANEL
ALT: 57 U/L — ABNORMAL HIGH (ref 0–44)
AST: 100 U/L — ABNORMAL HIGH (ref 15–41)
Albumin: 2.6 g/dL — ABNORMAL LOW (ref 3.5–5.0)
Alkaline Phosphatase: 173 U/L — ABNORMAL HIGH (ref 38–126)
Anion gap: 18 — ABNORMAL HIGH (ref 5–15)
BUN: 13 mg/dL (ref 8–23)
CO2: 15 mmol/L — ABNORMAL LOW (ref 22–32)
Calcium: 6.3 mg/dL — CL (ref 8.9–10.3)
Chloride: 105 mmol/L (ref 98–111)
Creatinine, Ser: 1.18 mg/dL — ABNORMAL HIGH (ref 0.44–1.00)
GFR calc Af Amer: 54 mL/min — ABNORMAL LOW (ref >60)
GFR calc non Af Amer: 47 mL/min — ABNORMAL LOW (ref >60)
Glucose, Bld: 89 mg/dL (ref 70–99)
Potassium: 4 mmol/L (ref 3.5–5.1)
Sodium: 138 mmol/L (ref 135–145)
Total Bilirubin: 1.2 mg/dL (ref 0.3–1.2)
Total Protein: 5.9 g/dL — ABNORMAL LOW (ref 6.5–8.1)

## 2019-11-12 LAB — MAGNESIUM: Magnesium: 0.9 mg/dL — CL (ref 1.7–2.4)

## 2019-11-12 LAB — PHOSPHORUS: Phosphorus: 3 mg/dL (ref 2.5–4.6)

## 2019-11-12 MED ORDER — GABAPENTIN 300 MG PO CAPS
300.0000 mg | ORAL_CAPSULE | Freq: Every day | ORAL | Status: DC
  Administered 2019-11-12 – 2019-11-14 (×3): 300 mg via ORAL
  Filled 2019-11-12 (×3): qty 1

## 2019-11-12 MED ORDER — MAGNESIUM SULFATE 2 GM/50ML IV SOLN
2.0000 g | Freq: Once | INTRAVENOUS | Status: AC
  Administered 2019-11-12: 2 g via INTRAVENOUS
  Filled 2019-11-12: qty 50

## 2019-11-12 MED ORDER — MAGNESIUM OXIDE 400 (241.3 MG) MG PO TABS
200.0000 mg | ORAL_TABLET | Freq: Two times a day (BID) | ORAL | Status: DC
  Administered 2019-11-12 – 2019-11-15 (×7): 200 mg via ORAL
  Filled 2019-11-12 (×7): qty 1

## 2019-11-12 MED ORDER — POTASSIUM CHLORIDE IN NACL 40-0.9 MEQ/L-% IV SOLN
INTRAVENOUS | Status: AC
  Administered 2019-11-12: 50 mL/h via INTRAVENOUS
  Filled 2019-11-12: qty 1000

## 2019-11-12 NOTE — Progress Notes (Addendum)
NEUROLOGY PROGRESS NOTE  Subjective: Patient's only complaint is slight shortness of breath.  Exam: Vitals:   11/12/19 0732 11/12/19 0923  BP: (!) 100/57 123/65  Pulse: 93 86  Resp: 18 18  Temp: 98 F (36.7 C) 98.8 F (37.1 C)  SpO2:  93%    ROS General ROS: negative for - chills, fatigue, fever, night sweats, weight gain or weight loss Psychological ROS: negative for - behavioral disorder, hallucinations, memory difficulties, mood swings or suicidal ideation Ophthalmic ROS: negative for - blurry vision, double vision, eye pain or loss of vision ENT ROS: negative for - epistaxis, nasal discharge, oral lesions, sore throat, tinnitus or vertigo Respiratory ROS: Positive for -  shortness of breath  Cardiovascular ROS: negative for - chest pain, dyspnea on exertion, edema or irregular heartbeat Gastrointestinal ROS: negative for - abdominal pain, diarrhea, hematemesis, nausea/vomiting or stool incontinence Genito-Urinary ROS: negative for - dysuria, hematuria, incontinence or urinary frequency/urgency Musculoskeletal ROS: negative for - joint swelling or muscular weakness Neurological ROS: as noted in HPI Dermatological ROS: negative for right hand and finger amputations and discoloration of left hand fingers        Physical Exam  Constitutional: Decreased muscle mass Psych: Affect appropriate to situation Eyes: No scleral injection HENT: No OP obstrucion Head: Normocephalic.  Cardiovascular: Normal rate and regular rhythm.  Respiratory: Slightly labored GI: Soft.  No distension. There is no tenderness.  Skin: WDI   Neuro:  Mental Status: Alert, believes she is in the emergency room however she knows it is November, the year is 2020, knows that she was brought in secondary to weakness and some confusion.  Speech fluent without evidence of aphasia.  Able to follow simple commands Cranial Nerves: II:  Visual fields grossly normal,  III,IV, VI: ptosis not present,  extra-ocular motions intact bilaterally pupils equal, round, reactive to light and accommodation V,VII: smile symmetric, facial light touch sensation normal bilaterally VIII: hearing normal bilaterally IX,X: Palate rises midline XI: bilateral shoulder shrug XII: midline tongue extension Motor: Right : Upper extremity   5/5    Left:     Upper extremity   5/5  Lower extremity   5/5     Lower extremity   5/5 Tone and bulk:normal tone throughout; no atrophy noted Sensory: Pinprick and light touch intact throughout, bilaterally Deep Tendon Reflexes: 1+ upper extremities with no knee jerk Plantars: Right: downgoing   Left: downgoing Cerebellar: normal finger-to-nose     Medications:  Scheduled: . citalopram  20 mg Oral Daily  . clopidogrel  75 mg Oral Daily  . cycloSPORINE  1 drop Both Eyes BID  . enoxaparin (LOVENOX) injection  40 mg Subcutaneous Q24H  . gabapentin  300 mg Oral QHS  . iron polysaccharides  150 mg Oral BID  . levothyroxine  50 mcg Oral QAC breakfast  . macitentan  10 mg Oral Daily  . magnesium oxide  200 mg Oral BID  . NIFEdipine  30 mg Oral Daily  . pantoprazole  40 mg Oral BID  . potassium chloride  20 mEq Oral BID  . predniSONE  5 mg Oral Daily  . saccharomyces boulardii  250 mg Oral BID  . sildenafil  60 mg Oral TID  . sodium chloride flush  3 mL Intravenous Once  . vitamin B-12  1,000 mcg Oral Daily   Continuous: . 0.9 % NaCl with KCl 40 mEq / L 50 mL/hr (11/12/19 0915)  . magnesium sulfate bolus IVPB     HKV:[id number]  Pertinent  Labs/Diagnostics: -Magnesium 0.9 -EEG showing no seizures or epileptiform discharges throughout    Ct Head Wo Contrast  Result Date: 11/11/2019 . IMPRESSION: 1.  No acute findings. 2. Mild chronic ischemic microvascular disease. Postsurgical change right cerebellar hemisphere with postsurgical change of the right occipital skull. Electronically Signed   By: Marin Olp M.D.   On: 11/11/2019 11:57   Ct  Angio Neck W Or Wo Contrast  Result Date: 11/11/2019  IMPRESSION: 1. Atherosclerotic calcification carotid bifurcation without significant stenosis. Mild to moderate stenosis cavernous carotid bilaterally 2. Mild to moderate stenosis distal right vertebral artery. Left vertebral artery widely patent 3. No intracranial large vessel occlusion. Electronically Signed   By: Franchot Gallo M.D.   On: 11/11/2019 13:05        Etta Quill PA-C [facility] [phone]   Assessment: 68 year old female with altered mental status which is fluctuating in the ED.  At this point she is alert and able to follow commands.  Likely altered mental status was secondary to metabolic encephalopathy and dehydration.  As noted this has improved overnight.  EEG shows no seizure or epileptiform discharges throughout recording   Recommendations: -Continue to replace fluids -Correct potassium, magnesium and calcium    11/12/2019, 10:17 AM   NEUROHOSPITALIST ADDENDUM Performed a face to face diagnostic evaluation.   I have reviewed the contents of history and physical exam as documented by PA/ARNP/Resident and agree with above documentation.  I have discussed and formulated the above plan as documented. Edits to the note have been made as needed.  Acute metabolic encephalopathy in the setting of dehydration, excessive gabapentin use and significant electrolyte disturbance. Patient back to baseline mental status. Will be available as needed.     Karena Addison Almer Bushey MD [facility] [phone]   If 7pm to 7am, please call on call as listed on AMION.

## 2019-11-12 NOTE — Procedures (Signed)
Patient Name: JENNILYN ESTEVE  MRN: [id number]  Epilepsy Attending: Lora Havens  Referring Physician/Provider: Dr. Antony Contras Date: 11/11/2019 Duration: 23.31 minutes  Patient history: 68 year old female with altered mental status and transient speech difficulties.  EEG to evaluate for seizures.  Level of alertness: Awake  AEDs during EEG study: None  Technical aspects: This EEG study was done with scalp electrodes positioned according to the 10-20 International system of electrode placement. Electrical activity was acquired at a sampling rate of 500Hz and reviewed with a high frequency filter of 70Hz and a low frequency filter of 1Hz. EEG data were recorded continuously and digitally stored.   Description: The posterior dominant rhythm consists of 9-10 Hz activity of moderate voltage (25-35 uV) seen predominantly in posterior head regions, symmetric and reactive to eye opening and eye closing.  Physiologic photic driving was not seen during photic stimulation.  Hyperventilation was not performed.  IMPRESSION: This study during awake state only is within normal limits. No seizures or epileptiform discharges were seen throughout the recording.  Clorine Swing Barbra Sarks

## 2019-11-12 NOTE — Evaluation (Addendum)
Physical Therapy Evaluation Patient Details Name: [name] [name] MRN: [id number] DOB: 1951/06/01 Today's Date: 11/12/2019   History of Present Illness  68 yo female admitted to ED on 11/10 with AMS, N/V and diarrhea over the course of the past week. Pt initially working up for CVA, neuro feels CVA is doubtful and metabolic encephalopathy due to electrolyte imbalance is more likely. EEG negative for acute findings. PMH inlcudes scleroderma with multiple R finger amputations, dyspnea on exertion on chronic O2 2-3L, HTN, melanoma, PAD with history of femoral endarterectomy and LE venous ablations, osteoporosis, ILD, CKD, foot ulceration.  Clinical Impression   Pt presents with generalized weakness, difficulty performing mobility tasks, impaired standing balance, increased work of breathing with activity, and decreased activity tolerance. Pt to benefit from acute PT to address deficits. Pt tolerated stand pivot to and from [facility] only, pt limited by fatigue and increased work of breathing with exertion. Pt requires min-mod assist for bed mobility and transfers at this time, PT recommended SNF level of care to pt and family and they decline stating they can manage at home. Therefore, PT recommending pt at minimum have HHPT and 24/7 assist upon d/c. PT to progress mobility as tolerated, and will continue to follow acutely.   Unable to obtain O2sats due to finger temperature vs circulation issues, pt with increased work of breathing.    Follow Up Recommendations SNF;Supervision/Assistance - 24 hour(pt and pt's husband politely decline recommendation)    Equipment Recommendations  None recommended by PT    Recommendations for Other Services       Precautions / Restrictions Precautions Precautions: Fall Restrictions Weight Bearing Restrictions: No      Mobility  Bed Mobility Overal bed mobility: Needs Assistance Bed Mobility: Supine to Sit;Sit to Supine     Supine to sit: Mod assist;HOB  elevated Sit to supine: Mod assist;HOB elevated   General bed mobility comments: Mod assist for supine<>sit for trunk elevation, LE lifting and translation to and from EOB, and scooting pt up in bed with use of bed pads. Very increased time to perform, pt limited by increased work of breathing and fatiguability.  Transfers Overall transfer level: Needs assistance Equipment used: Rolling walker (2 wheeled);1 person hand held assist Transfers: Sit to/from [facility] Sit to Stand: Min assist Stand pivot transfers: Min assist       General transfer comment: Min assisst for power up from EOB, steadying upon standing. Sit to stand x2, once from EOB [facility]) and once from [facility] (use of RW). PT assisted pt with stand pivot to [facility] at bedside with HHA, pt very unsteady and requiring husbands assist to lower onto [facility]  Ambulation/Gait Ambulation/Gait assistance: (NT - pt refuses due to fatigue and increased work of breathing on 3LO2)              Stairs            Wheelchair Mobility    Modified Rankin (Stroke Patients Only)       Balance Overall balance assessment: Needs assistance Sitting-balance support: No upper extremity supported;Feet supported Sitting balance-Leahy Scale: Fair     Standing balance support: Single extremity supported Standing balance-Leahy Scale: Poor Standing balance comment: reliant on external support in standing, especially dynamically                             Pertinent Vitals/Pain Pain Assessment: Faces Faces Pain Scale: Hurts a little bit Pain Location: general  discomfort Pain Descriptors / Indicators: Grimacing;Guarding Pain Intervention(s): Limited activity within patient's tolerance;Monitored during session;Repositioned    Home Living Family/patient expects to be discharged to:: Private residence Living Arrangements: Spouse/significant other Available Help at Discharge: Family;Available 24 hours/day Type of  Home: House Home Access: Stairs to enter   [employer] of Steps: 2 Home Layout: Able to live on main level with bedroom/bathroom Home Equipment: Walker - 2 wheels;Bedside commode;Wheelchair - manual      Prior Function Level of Independence: Independent         Comments: Per pt and husband, pt was 100% independent with mobility and ADLs PTA, had just slowed down due to recent GI virus. Pt with no history of falls.     Hand Dominance   Dominant Hand: Right    Extremity/Trunk Assessment   Upper Extremity Assessment Upper Extremity Assessment: Defer to OT evaluation    Lower Extremity Assessment Lower Extremity Assessment: Generalized weakness    Cervical / Trunk Assessment Cervical / Trunk Assessment: Kyphotic  Communication   Communication: No difficulties  Cognition Arousal/Alertness: Awake/alert Behavior During Therapy: Flat affect;WFL for tasks assessed/performed Overall Cognitive Status: Impaired/Different from baseline Area of Impairment: Following commands;Safety/judgement;Problem solving                       Following Commands: Follows one step commands inconsistently;Follows one step commands with increased time Safety/Judgement: Decreased awareness of deficits;Decreased awareness of safety   Problem Solving: Slow processing;Decreased initiation;Difficulty sequencing;Requires verbal cues;Requires tactile cues General Comments: oriented to self and situation, pt requires increased processing time and multiple repetitions of mobility commands in order to follow instructions.      General Comments      Exercises     Assessment/Plan    PT Assessment Patient needs continued PT services  PT Problem List Decreased strength;Decreased mobility;Decreased safety awareness;Decreased activity tolerance;Decreased cognition;Decreased balance;Decreased knowledge of use of DME;Pain       PT Treatment Interventions DME instruction;Therapeutic  activities;Gait training;Therapeutic exercise;Patient/family education;Balance training;Stair training;Functional mobility training    PT Goals (Current goals can be found in the Care Plan section)  Acute Rehab PT Goals Patient Stated Goal: get stronger PT Goal Formulation: With patient/family Time For Goal Achievement: 11/26/19 Potential to Achieve Goals: Good    Frequency Min 3X/week   Barriers to discharge        Co-evaluation               AM-PAC PT "6 Clicks" Mobility  Outcome Measure Help needed turning from your back to your side while in a flat bed without using bedrails?: A Little Help needed moving from lying on your back to sitting on the side of a flat bed without using bedrails?: A Lot Help needed moving to and from a bed to a chair (including a wheelchair)?: A Lot Help needed standing up from a chair using your arms (e.g., wheelchair or bedside chair)?: A Little Help needed to walk in hospital room?: A Lot Help needed climbing 3-5 steps with a railing? : A Lot 6 Click Score: 14    End of Session Equipment Utilized During Treatment: Gait belt;Oxygen Activity Tolerance: Patient limited by fatigue Patient left: in bed;with call bell/phone within reach;with bed alarm set;with family/visitor present Nurse Communication: Mobility status PT Visit Diagnosis: Difficulty in walking, not elsewhere classified (R26.2);Other abnormalities of gait and mobility (R26.89);Muscle weakness (generalized) (M62.81);Unsteadiness on feet (R26.81)    Time: 1040-1105 PT Time Calculation (min) (ACUTE ONLY): 25 min  Charges:   PT Evaluation $PT Eval Low Complexity: 1 Low PT Treatments $Therapeutic Activity: 8-22 mins        Randall Colden E, PT Acute Rehabilitation Services Pager [phone]  Office [phone]  Tou Hayner D Elonda Husky 11/12/2019, 11:31 AM

## 2019-11-12 NOTE — TOC Initial Note (Addendum)
Transition of Care (TOC) - Initial/Assessment Note  **Patient will d/c home - MD please see below regarding [place] orders**   Patient Details  Name: [name] [name] MRN: [id number] Date of Birth: [birth year]  Transition of Care [facility]) CM/SW Contact:    Sable Feil, LCSW Phone Number: 11/12/2019, 3:55 PM  Clinical Narrative: CSW visited with patient and spouse at the bedside to discuss the discharge disposition and to confirm patient/spouse's desire to d/c home versus SNF. [name] was in bed and was alert, oriented and agreeable to talking with CSW, and husband was agreeable also. Spouse indicated that he has been caring for his wife and will continue to do so. They live in a 2-story townhouse, however their bedroom is downstairs and [name] does not go upstairs per spouse. When asked, CSW advised that their daughter died in 03-10-2011 of CA, and they have an 42 year-old granddaughter that they hope to see Thanksgiving.    Discussed [place] services with patient/spouse and initially they declined, however after further discussion, patient/spouse were agreeable to PT. When asked, CSW informed that they have not had [facility] services before and patient has a bedside commode, walker and wheelchair.  RN case manager informed and another visit was made to room to discuss [facility] services with case manager. Choice was offered and pt./spouse chose [facility] and indicated that they have used this service before. Referral was made to [place] and is pending for PT and RN.  **Patient will need orders for Home Health RN and PT with Face-to-Face.** Patient currently has the following DME: bedside commode, walker and wheelchair.  Mrs. Revak expressed her desire to discharge today and contact was made with MD via secure chat regarding this. MD responded that she is monitoring patients and patient may discharge tomorrow, 11/13.  UPDATE (4:55 pm) Home health services approved by [place].             Expected Discharge Plan: [place] Barriers to Discharge: Continued Medical Work up   Patient Goals and CMS Choice Patient states their goals for this hospitalization and ongoing recovery are:: Patient wants to discharge home versus SNF for continued recovery CMS Medicare.gov Compare Post Acute Care list provided to:: Other (Comment Required)(Nurse case manager talked with patient/husband regarding [facility] and they chose [facility] as they have use this company before) Choice offered to / list presented to : Patient(Choice offered to patient/husband for [facility] services and [facility] chosen)  Expected Discharge Plan and Services Expected Discharge Plan: [place]   Discharge Planning Services: Other - See comment(Nurse case manager talked with patient/spouse re: [place] services)   Living arrangements for the past 2 months: Single Family Home(Townhome)                           HH Arranged: PT, Refused SNF, RN [place] Agency: [place] ([facility]) Date [place]: 11/12/19 Time [place]: Mar 10, 1550 Representative spoke with at [place]: Butch Penny  Prior Living Arrangements/Services Living arrangements for the past 2 months: Single Family Home(Townhome) Lives with:: Spouse Patient language and need for interpreter reviewed:: No Do you feel safe going back to the place where you live?: Yes      Need for Family Participation in Patient Care: Yes (Comment) Care giver support system in place?: Yes (comment) Current home services: Other (comment)(None at this time) Criminal Activity/Legal Involvement  Pertinent to Current Situation/Hospitalization: No - Comment as needed  Activities of Daily Living      Permission Sought/Granted Permission sought to share information with : Family Supports(Husband at the bedside) Permission granted to share information with : Yes, Verbal Permission Granted  Share  Information with NAME: Juanda Crumble Flaten(Mr. Landowski was at the bedside)     Permission granted to share info w Relationship: Spouse  Permission granted to share info w Contact Information: [phone] [facility]  Emotional Assessment Appearance:: Appears stated age Attitude/Demeanor/Rapport: Engaged Affect (typically observed): Pleasant Orientation: : Oriented to Self, Oriented to Place, Oriented to  Time, Oriented to Situation Alcohol / Substance Use: Tobacco Use, Alcohol Use, Illicit Drugs(Per H&P, patient has never smoked, drins 1/2 alcoholic drinks per week and has neve rused illicit drugs) Psych Involvement: No (comment)  Admission diagnosis:  Hypocalcemia [E83.51] Dehydration [E86.0] Hypokalemia [E87.6] Hypomagnesemia [C13.64] Acute metabolic encephalopathy [B83.77] Diarrhea, unspecified type [R19.7] Patient Active Problem List   Diagnosis Date Noted  . AMS (altered mental status) 11/12/2019  . Altered mental status 11/11/2019  . Hypomagnesemia 11/11/2019  . Osteoporosis 01/22/2019  . Hypothyroidism 06/16/2018  . Pulmonary hypertension associated with systemic disorder ([place]) 10/08/2017  . Dry gangrene ([place]) 09/11/2017  . Medication reaction 09/11/2017  . ILD (interstitial lung disease) ([place]) 07/22/2017  . Chronic respiratory failure ([place]) 10/31/2016  . Atherosclerosis of native arteries of right leg with ulceration of unspecified site ([place]) 09/14/2016  . Prolonged Q-T interval on ECG 07/29/2016  . CKD (chronic kidney disease), stage III 07/29/2016  . Scleroderma ([place]) 05/06/2016  . Depression 04/24/2016  . Hypokalemia 10/07/2015  . 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 Allergic rhinitis 03/08/2015  . Varicose veins of lower extremities with ulcer and inflammation ([place]) 07/24/2012  . 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[place]) 04/08/2012  . Pulmonary hypertension associated with systemic disorder ([place]) 03/25/2012  . Peripheral arterial occlusive  disease ([place]) 08/10/2011  . Essential hypertension 08/10/2011  . Hyperlipidemia 08/10/2011   PCP:  [name], MD Pharmacy:   [id number] [place] [id number]: [phone]: [address] [address]Phone: [phone]: [phone]    Social Determinants of Health (SDOH) Interventions  No SDOH interventions needed at this time  Readmission Risk Interventions No flowsheet data found.

## 2019-11-12 NOTE — Progress Notes (Addendum)
PROGRESS NOTE    [name] [name]  XBJ:[id number] DOB: [birth year] DOA: 11/10/2019 PCP: [name], MD  Brief Narrative: [name] [name] is a 68 y.o. 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 -According to the patient's husband, patient had recently undergone right finger amputations due to her scleroderma and necrosis last month.  About 2 weeks ago and more severe about 1 week, she started feeling unwell, nauseous all the time and few episodes of vomiting, she has not been able to eat anything for at least last few days.  Patient had 2-3 loose bowel movements, mostly watery a day since last 1 week.  No fever or chills.  No abdominal pain.  No recent antibiotic use. ED Course: Patient afebrile in the ER.  Potassium is 3, magnesium is 0.6, calcium 6.7.  Due to altered mental status and inability to speak, a code stroke was called by EDP.  A CT head and CT angiogram of the neck and head is essentially normal.  She was in the emergency room 3 days ago, a CT scan of the abdomen pelvis was essentially normal. Patient was also seen and evaluated by her primary care physician yesterday, a C. difficile was negative.Her diarrhea has improved.  She has no bowel movement for almost 24 hours after last stool test samples   Assessment & Plan:  1.    Lethargy, Acute metabolic encephalopathy  -Likely secondary to dehydration, high-dose gabapentin use  -No other acute trigger obvious at this time, she did have vomiting and diarrhea recently, this appears to have resolved now, CT abdomen pelvis few days prior to admission except  for gall stones -Mental status is improving, she is awake alert oriented x2 and appropriate, lfts abnormal will trend  -decrease gabapentin to 300 mg nightly from 600 mg twice daily  -Continue gentle IV fluids with KCl today -Covid PCR is negative, no symptoms of UTI, follow-up urine culture -PT OT evaluation  2.  Severe hypomagnesemia and hypokalemia -Replace IV mag again today, still 0.9 after replacement, continue oral mag as well -Potassium improving, will monitor  3.   Abnormal LFTs -Abdominal exam is benign, , recent CT abdomen was unremarkable with gall stones, trend and monitor  4. Advanced scleroderma with peripheral arterial disease -Has chronic leg wounds, followed at the wound center, wounds without signs of infection at this time -Wound care consult requested -Continue low-dose chronic prednisone, followed by Dr. Amil Amen -Also continue Plavix  5.  Severe pulmonary hypertension, chronic hypoxic respiratory failure -Due to COPD and pulmonary hypertension -Stable continue 2 L of O2 -Continue macitentan and sildenafil  -Also has interstitial lung disease from scleroderma, followed by Dr. Lamonte Sakai, x-ray notes stable groundglass opacities -Hold diuretics today  5.  Hypothyroidism: On Synthroid replacement.  Continue.  6.  History of non-Hodgkin's lymphoma -Treated with chemotherapy, reportedly in remission  DVT prophylaxis: Lovenox subcu Code Status: Full code Family Communication:  No family at bedside Disposition Plan: Home in 1 to 2 days   Procedures:   Antimicrobials:    Subjective: -Feels better this morning, per staff more awake -Reports that she has been chronically ill and weak for a while  Objective: Vitals:  11/11/19 2014 11/12/19 0630 11/12/19 0732 11/12/19 0923  BP:  (!) 96/57 (!) 100/57 123/65  Pulse:  98 93 86  Resp:  _0 Temp:   98 F (36.7 C) 98.8 F (37.1 C)  TempSrc:   Oral Oral  SpO2: 92%   93%    Intake/Output Summary  (Last 24 hours) at 11/12/2019 1144 Last data filed at 11/12/2019 0900 Gross per 24 hour  Intake 1303.33 ml  Output 400 ml  Net 903.33 ml   There were no vitals filed for this visit.  Examination:  General exam: Frail chronically ill female sitting up in bed awake alert oriented to self and place Respiratory system: Poor air movement bilaterally, decreased breath sounds at both bases Cardiovascular system: S1 & S2 heard, RRR. Gastrointestinal system: Abdomen is nondistended, soft and nontender.Normal bowel sounds heard. Central nervous system: Alert and oriented. No focal neurological deficits. Extremities: No edema multiple digit amputations in fingers and toes chronic old healing ulcers in lower left lower leg on the foot and lower third of the leg Skin: As above Psychiatry:  Mood & affect appropriate.     Data Reviewed:   CBC: Recent Labs  Lab 11/08/19 1329 11/10/19 2104 11/12/19 0546  WBC 7.7 8.5 9.1  HGB 11.4* 11.0* 9.5*  HCT 36.5 35.2* 30.8*  MCV 106.1* 105.1* 106.9*  PLT 266 225 001   Basic Metabolic Panel: Recent Labs  Lab 11/08/19 1329 11/10/19 2104 11/11/19 1203 11/11/19 2259 11/12/19 0546  NA 137 133*  --  139 138  K 3.1* 3.0*  --  4.0 4.0  CL 101 96*  --  104 105  CO2 21* 24  --  18* 15*  GLUCOSE 92 120*  --  77 89  BUN 17 11  --  13 13  CREATININE 0.72 0.98  --  0.98 1.18*  CALCIUM 7.2* 6.7*  --  6.7* 6.3*  MG  --   --  0.6* 1.1* 0.9*  PHOS  --   --   --  3.0 3.0   GFR: Estimated Creatinine Clearance: 42.9 mL/min (A) (by C-G formula based on SCr of 1.18 mg/dL (H)). Liver Function Tests: Recent Labs  Lab 11/08/19 1329 11/11/19 1203 11/11/19 2259 11/12/19 0546  AST 32 40 47* 100*  ALT _1 57*  ALKPHOS 64 78 109 173*  BILITOT 0.7 1.3* 1.1 1.2  PROT 7.3 6.8 6.6 5.9*  ALBUMIN 3.6 3.0* 2.8* 2.6*   Recent Labs  Lab 11/08/19 1329  LIPASE 109*   No results for input(s): AMMONIA in the last 168 hours. Coagulation Profile: No results  for input(s): INR, PROTIME in the last 168 hours. Cardiac Enzymes: No results for input(s): CKTOTAL, CKMB, CKMBINDEX, TROPONINI in the last 168 hours. BNP (last 3 results) No results for input(s): PROBNP in the last 8760 hours. HbA1C: No results for input(s): HGBA1C in the last 72 hours. CBG: No results for input(s): GLUCAP in the last 168 hours. Lipid Profile: No results for input(s): CHOL, HDL, LDLCALC, TRIG, CHOLHDL, LDLDIRECT in the last 72 hours. Thyroid Function Tests: Recent Labs    11/11/19 1640  TSH 1.975   Anemia Panel: No results for input(s): VITAMINB12, FOLATE, FERRITIN, TIBC, IRON, RETICCTPCT in the last 72 hours. Urine analysis:    Component Value Date/Time   COLORURINE YELLOW 11/10/2019 2102   APPEARANCEUR HAZY (A) 11/10/2019 2102   LABSPEC 1.013 11/10/2019 2102   PHURINE 5.0 11/10/2019 2102   GLUCOSEU NEGATIVE 11/10/2019 2102  HGBUR SMALL (A) 11/10/2019 2102   BILIRUBINUR NEGATIVE 11/10/2019 2102   BILIRUBINUR NEG 12/10/2018 1211   KETONESUR NEGATIVE 11/10/2019 2102   PROTEINUR 100 (A) 11/10/2019 2102   UROBILINOGEN 0.2 12/10/2018 1211   NITRITE NEGATIVE 11/10/2019 2102   LEUKOCYTESUR MODERATE (A) 11/10/2019 2102   Sepsis Labs: _0 (procalcitonin:4,lacticidven:4)  ) Recent Results (from the past 240 hour(s))  Stool culture     Status: None (Preliminary result)   Collection Time: 11/10/19 12:43 PM   Specimen: Stool  Result Value Ref Range Status   MICRO NUMBER: [id number]  Preliminary   SPECIMEN QUALITY: Adequate  Preliminary   Source STOOL  Preliminary   STATUS: PRELIMINARY  Preliminary   CAM RESULT: Culture in progress  Preliminary   MICRO NUMBER: [id number]  Preliminary   SPECIMEN QUALITY: Adequate  Preliminary   SOURCE: STOOL  Preliminary   STATUS: PRELIMINARY  Preliminary   SS RESULT: Culture in progress  Preliminary  SARS CORONAVIRUS 2 (TAT 6-24 HRS) Nasopharyngeal Nasopharyngeal Swab     Status: None   Collection Time: 11/11/19  1:35  PM   Specimen: Nasopharyngeal Swab  Result Value Ref Range Status   SARS Coronavirus 2 NEGATIVE NEGATIVE Final    Comment: (NOTE) SARS-CoV-2 target nucleic acids are NOT DETECTED. The SARS-CoV-2 RNA is generally detectable in upper and lower respiratory specimens during the acute phase of infection. Negative results do not preclude SARS-CoV-2 infection, do not rule out co-infections with other pathogens, and should not be used as the sole basis for treatment or other patient management decisions. Negative results must be combined with clinical observations, patient history, and epidemiological information. The expected result is Negative. Fact Sheet for Patients: SugarRoll.be Fact Sheet for Healthcare Providers: https://www.woods-mathews.com/ This test is not yet approved or cleared by the [place] FDA and  has been authorized for detection and/or diagnosis of SARS-CoV-2 by FDA under an Emergency Use Authorization (EUA). This EUA will remain  in effect (meaning this test can be used) for the duration of the COVID-19 declaration under Section 56 4(b)(1) of the Act, 21 U.S.C. section 360bbb-3(b)(1), unless the authorization is terminated or revoked sooner. Performed at [facility], [place] [address], [place], [place] [address]          Radiology Studies: Ct Angio Head W Or Wo Contrast  Result Date: 11/11/2019 CLINICAL DATA:  Slurred speech and weakness.  Rule out stroke EXAM: CT ANGIOGRAPHY HEAD AND NECK TECHNIQUE: Multidetector CT imaging of the head and neck was performed using the standard protocol during bolus administration of intravenous contrast. Multiplanar CT image reconstructions and MIPs were obtained to evaluate the vascular anatomy. Carotid stenosis measurements (when applicable) are obtained utilizing NASCET criteria, using the distal internal carotid diameter as the denominator. CONTRAST:  144m OMNIPAQUE IOHEXOL 350  MG/ML SOLN COMPARISON:  CT head 11/11/2019 FINDINGS: CTA NECK FINDINGS Aortic arch: Atherosclerotic aortic arch. Aorta mass and mildly dilated. Ascending aorta 38 mm in diameter. Atherosclerotic disease in the proximal great vessels without significant stenosis. Right carotid system: Right common carotid artery widely patent. Atherosclerotic calcification right carotid bifurcation. Less than 25% diameter stenosis proximal right internal carotid artery. Left carotid system: Left common carotid artery widely patent. Mild atherosclerotic calcification left carotid bifurcation without significant carotid stenosis. Vertebral arteries: Both vertebral arteries patent to the basilar. Mild-to-moderate stenosis distal right vertebral artery otherwise no vertebral stenosis. Skeleton: Marked kyphoscoliosis in the thoracic spine. No acute skeletal abnormality. Left occipital craniotomy and cranioplasty. Other neck: Negative for soft tissue mass.  Upper chest: Apical scarring. Patchy airspace disease in the lung apices bilaterally which could be scarring or infection. 5 mm left upper lobe nodule Review of the MIP images confirms the above findings CTA HEAD FINDINGS Anterior circulation: Atherosclerotic calcification in the cavernous carotid bilaterally. Mild to moderate stenosis in the cavernous carotid bilaterally. Anterior and middle cerebral arteries patent bilaterally without significant stenosis. Posterior circulation: Mild to moderate stenosis distal right vertebral artery. Left vertebral patent. PICA patent bilaterally. Basilar widely patent. Superior cerebellar and posterior cerebral arteries patent bilaterally. Venous sinuses: Patent Anatomic variants: None Review of the MIP images confirms the above findings IMPRESSION: 1. Atherosclerotic calcification carotid bifurcation without significant stenosis. Mild to moderate stenosis cavernous carotid bilaterally 2. Mild to moderate stenosis distal right vertebral artery. Left  vertebral artery widely patent 3. No intracranial large vessel occlusion. Electronically Signed   By: Franchot Gallo M.D.   On: 11/11/2019 13:05   Ct Head Wo Contrast  Result Date: 11/11/2019 CLINICAL DATA:  Altered mental status. EXAM: CT HEAD WITHOUT CONTRAST TECHNIQUE: Contiguous axial images were obtained from the base of the skull through the vertex without intravenous contrast. COMPARISON:  Brain MRI 11/29/2017 FINDINGS: Brain: Ventricles, cisterns and other CSF spaces are within normal. There is mild chronic ischemic microvascular disease. Stable focus of encephalomalacia over the right cerebellar hemisphere. No mass, mass effect, shift of midline structures or acute hemorrhage. No evidence of acute infarction. Vascular: No hyperdense vessel or unexpected calcification. Skull: Previous right occipital craniotomy unchanged. Sinuses/Orbits: No acute finding. Other: None. IMPRESSION: 1.  No acute findings. 2. Mild chronic ischemic microvascular disease. Postsurgical change right cerebellar hemisphere with postsurgical change of the right occipital skull. Electronically Signed   By: Marin Olp M.D.   On: 11/11/2019 11:57   Ct Angio Neck W Or Wo Contrast  Result Date: 11/11/2019 CLINICAL DATA:  Slurred speech and weakness.  Rule out stroke EXAM: CT ANGIOGRAPHY HEAD AND NECK TECHNIQUE: Multidetector CT imaging of the head and neck was performed using the standard protocol during bolus administration of intravenous contrast. Multiplanar CT image reconstructions and MIPs were obtained to evaluate the vascular anatomy. Carotid stenosis measurements (when applicable) are obtained utilizing NASCET criteria, using the distal internal carotid diameter as the denominator. CONTRAST:  187m OMNIPAQUE IOHEXOL 350 MG/ML SOLN COMPARISON:  CT head 11/11/2019 FINDINGS: CTA NECK FINDINGS Aortic arch: Atherosclerotic aortic arch. Aorta mass and mildly dilated. Ascending aorta 38 mm in diameter. Atherosclerotic disease  in the proximal great vessels without significant stenosis. Right carotid system: Right common carotid artery widely patent. Atherosclerotic calcification right carotid bifurcation. Less than 25% diameter stenosis proximal right internal carotid artery. Left carotid system: Left common carotid artery widely patent. Mild atherosclerotic calcification left carotid bifurcation without significant carotid stenosis. Vertebral arteries: Both vertebral arteries patent to the basilar. Mild-to-moderate stenosis distal right vertebral artery otherwise no vertebral stenosis. Skeleton: Marked kyphoscoliosis in the thoracic spine. No acute skeletal abnormality. Left occipital craniotomy and cranioplasty. Other neck: Negative for soft tissue mass. Upper chest: Apical scarring. Patchy airspace disease in the lung apices bilaterally which could be scarring or infection. 5 mm left upper lobe nodule Review of the MIP images confirms the above findings CTA HEAD FINDINGS Anterior circulation: Atherosclerotic calcification in the cavernous carotid bilaterally. Mild to moderate stenosis in the cavernous carotid bilaterally. Anterior and middle cerebral arteries patent bilaterally without significant stenosis. Posterior circulation: Mild to moderate stenosis distal right vertebral artery. Left vertebral patent. PICA patent bilaterally. Basilar widely patent. Superior cerebellar and  posterior cerebral arteries patent bilaterally. Venous sinuses: Patent Anatomic variants: None Review of the MIP images confirms the above findings IMPRESSION: 1. Atherosclerotic calcification carotid bifurcation without significant stenosis. Mild to moderate stenosis cavernous carotid bilaterally 2. Mild to moderate stenosis distal right vertebral artery. Left vertebral artery widely patent 3. No intracranial large vessel occlusion. Electronically Signed   By: Franchot Gallo M.D.   On: 11/11/2019 13:05   Dg Chest Portable 1 View  Result Date:  11/11/2019 CLINICAL DATA:  Shortness of breath. EXAM: PORTABLE CHEST 1 VIEW COMPARISON:  Chest x-ray May 08, 2016. CT scan of the chest January 21, 2019 FINDINGS: Bilateral interstitial opacities are again identified and similar since the January 2020 CT scan. Prominence of the hila is likely due to pulmonary arterial hypertension given previous CT imaging. Stable cardiomegaly. No pneumothorax. No other acute abnormalities are identified. IMPRESSION: 1. Diffuse slightly nodular ground-glass opacities in the lungs are similar compared to January 2020 but increased compared to 2017. 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 No overt edema today. 2. Enlarged hila likely due to pulmonary arterial hypertension given previous CT imaging. Electronically Signed   By: Dorise Bullion III M.D   On: 11/11/2019 13:15        Scheduled Meds:  citalopram  20 mg Oral Daily   clopidogrel  75 mg Oral Daily   cycloSPORINE  1 drop Both Eyes BID   enoxaparin (LOVENOX) injection  40 mg Subcutaneous Q24H   gabapentin  300 mg Oral QHS   iron polysaccharides  150 mg Oral BID   levothyroxine  50 mcg Oral QAC breakfast   macitentan  10 mg Oral Daily   magnesium oxide  200 mg Oral BID   NIFEdipine  30 mg Oral Daily   pantoprazole  40 mg Oral BID   potassium chloride  20 mEq Oral BID   predniSONE  5 mg Oral Daily   saccharomyces boulardii  250 mg Oral BID   sildenafil  60 mg Oral TID   sodium chloride flush  3 mL Intravenous Once   vitamin B-12  1,000 mcg Oral Daily   Continuous Infusions:  0.9 % NaCl with KCl 40 mEq / L 50 mL/hr (11/12/19 1021)   magnesium sulfate bolus IVPB       LOS: 0 days    Time spent: 39mn    PDomenic Polite MD [facility]  11/12/2019, 11:44 AM

## 2019-11-12 NOTE — Progress Notes (Signed)
Orthopedic Tech Progress Note Patient Details:  [name] [name] 11-Oct-1951 [id number]  Ortho Devices Type of Ortho Device: Forensic scientist Interventions Patient Tolerated: Refused intervention   Braulio Bosch 11/12/2019, 2:26 PM

## 2019-11-12 NOTE — Consult Note (Signed)
[place] Nurse wound consult note Patient receiving care in [facility] 5M[facility].  I spoke with primary RN, Cami, via telephone. I have sent the following SecureChat to Dr. Fanny Bien "Good morning Dr. Broadus John. I would like to help with the [facility] consult for the RLE wounds on this patient. Would you be willing to post photos in the EMR for her? Cami, her primary RN, and I spoke over the phone. She can take the unna boot down. Thank you for considering my request."  Reason for Consult: "leg ulcers".  According to Cami there is a wound on the right ankle and the right calf. Wound type: unclear at this time  AT 1300 I spoke with primary RN, Cami, and learned that the lateral ankle has a clear, hardened scab, no drainage, no odor, no erythema.  Photos not available in EMR.  The calf area has a linear clear scab that measures 2 cm x 4.1 cm.  This wound does not have drainage, odor, or eythema. Dressing procedure/placement/frequency:  Size appropriate foam dressing to lateral ankle, then Unna boot, change weekly. Thank you for the consult.  Discussed plan of care with the bedside nurse.  [facility] nurse will not follow at this time.  Please re-consult the [facility] team if needed.  Val Riles, RN, MSN, CWOCN, CNS-BC, pager [phone]

## 2019-11-13 ENCOUNTER — Encounter (HOSPITAL_COMMUNITY): Payer: Self-pay | Admitting: General Surgery

## 2019-11-13 ENCOUNTER — Inpatient Hospital Stay (HOSPITAL_COMMUNITY): Payer: Medicare Other

## 2019-11-13 LAB — STOOL CULTURE
MICRO NUMBER:: 1084118
MICRO NUMBER:: 1084119
MICRO NUMBER:: 1084122
SHIGA RESULT:: NOT DETECTED
SPECIMEN QUALITY:: ADEQUATE
SPECIMEN QUALITY:: ADEQUATE
SPECIMEN QUALITY:: ADEQUATE

## 2019-11-13 LAB — COMPREHENSIVE METABOLIC PANEL
ALT: 302 U/L — ABNORMAL HIGH (ref 0–44)
AST: 433 U/L — ABNORMAL HIGH (ref 15–41)
Albumin: 2.5 g/dL — ABNORMAL LOW (ref 3.5–5.0)
Alkaline Phosphatase: 212 U/L — ABNORMAL HIGH (ref 38–126)
Anion gap: 13 (ref 5–15)
BUN: 15 mg/dL (ref 8–23)
CO2: 18 mmol/L — ABNORMAL LOW (ref 22–32)
Calcium: 6.4 mg/dL — CL (ref 8.9–10.3)
Chloride: 106 mmol/L (ref 98–111)
Creatinine, Ser: 1.23 mg/dL — ABNORMAL HIGH (ref 0.44–1.00)
GFR calc Af Amer: 51 mL/min — ABNORMAL LOW (ref >60)
GFR calc non Af Amer: 44 mL/min — ABNORMAL LOW (ref >60)
Glucose, Bld: 112 mg/dL — ABNORMAL HIGH (ref 70–99)
Potassium: 5 mmol/L (ref 3.5–5.1)
Sodium: 137 mmol/L (ref 135–145)
Total Bilirubin: 1.2 mg/dL (ref 0.3–1.2)
Total Protein: 6 g/dL — ABNORMAL LOW (ref 6.5–8.1)

## 2019-11-13 LAB — CBC
HCT: 30.6 % — ABNORMAL LOW (ref 36.0–46.0)
Hemoglobin: 9.9 g/dL — ABNORMAL LOW (ref 12.0–15.0)
MCH: 33 pg (ref 26.0–34.0)
MCHC: 32.4 g/dL (ref 30.0–36.0)
MCV: 102 fL — ABNORMAL HIGH (ref 80.0–100.0)
Platelets: 256 10*3/uL (ref 150–400)
RBC: 3 MIL/uL — ABNORMAL LOW (ref 3.87–5.11)
RDW: 13.3 % (ref 11.5–15.5)
WBC: 7.2 10*3/uL (ref 4.0–10.5)
nRBC: 0 % (ref 0.0–0.2)

## 2019-11-13 LAB — OVA AND PARASITE EXAMINATION
CONCENTRATE RESULT:: NONE SEEN
MICRO NUMBER:: 1084121
SPECIMEN QUALITY:: ADEQUATE
TRICHROME RESULT:: NONE SEEN

## 2019-11-13 LAB — FECAL LACTOFERRIN, QUANT
Fecal Lactoferrin: POSITIVE — AB
MICRO NUMBER:: 1084120
SPECIMEN QUALITY:: ADEQUATE

## 2019-11-13 LAB — CLOSTRIDIUM DIFFICILE TOXIN B, QUALITATIVE, REAL-TIME PCR: Toxigenic C. Difficile by PCR: NOT DETECTED

## 2019-11-13 NOTE — Progress Notes (Addendum)
PROGRESS NOTE    ENVY MENO  GMW:[id number] DOB: May 20, 1951 DOA: 11/10/2019 PCP: [name], MD  Brief Narrative: [name] [name] is a 68 y.o. 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 -According to the patient's husband, patient had recently undergone right finger amputations due to her scleroderma and necrosis last month.  About 2 weeks ago and more severe about 1 week, she started feeling unwell, nauseous all the time and few episodes of vomiting, she has not been able to eat anything for at least last few days.  Patient had 2-3 loose bowel movements, mostly watery a day since last 1 week.  No fever or chills.  No abdominal pain.  No recent antibiotic use. ED Course: Patient afebrile in the ER.  Potassium is 3, magnesium is 0.6, calcium 6.7.  Due to altered mental status and inability to speak, a code stroke was called by EDP.  A CT head and CT angiogram of the neck and head is essentially normal.  She was in the emergency room 3 days ago, a CT scan of the abdomen pelvis was unremarkable. Patient was also seen and evaluated by her primary care physician yesterdayHer diarrhea has improved.  -mental status improved with hydration and cutting down gabapentin -LFTs significantly worse 11/13  Assessment & Plan:  1.    Lethargy, Acute metabolic encephalopathy  -Likely secondary to dehydration, high-dose gabapentin use  -Worsening LFTs could be contributing as well, mental status has improved considerably, not back to baseline yet  -decrease gabapentin to 300 mg nightly from 600 mg twice daily  -CT head  unremarkable -Continue gentle IV fluids with KCl today -Covid PCR is negative, no symptoms of UTI, follow-up urine culture -PT OT evaluation   2.  Severe hypomagnesemia and hypokalemia -Replaced magnesium IV x2, continue oral mag oxide, potassium is 5 today,  will stop IV fluids  3.   Worsening LFTs, gallstones -Abdominal exam is benign however patient is immunocompromised with chronic steroid use, temp of 100.6 this morning -Right upper quadrant ultrasound with some gallbladder wall thickening noted -Will request general surgery input -Immunocompromise state, chronic prednisone use could affect symptomatology  4. Advanced scleroderma with peripheral arterial disease -Has chronic leg wounds, followed at the wound center, wounds without signs of infection at this time -Wound care consult requested -Continue low-dose chronic prednisone, followed by Dr. Amil Amen -On Plavix for PAD  5.  Severe pulmonary hypertension, chronic hypoxic respiratory failure -Due to COPD and pulmonary hypertension -Stable continue 2 L of O2 -Continue macitentan and sildenafil  -Also has interstitial lung disease from scleroderma, followed by Dr. Lamonte Sakai, x-ray notes stable groundglass opacities -Hold diuretics 1 more day  5.  Hypothyroidism: On Synthroid replacement.  Continue.  6.  History of non-Hodgkin's lymphoma -Treated with chemotherapy, reportedly in remission  DVT prophylaxis: Lovenox subcu Code Status:  Discussed CODE STATUS with patient and spouse, she is DNR Family Communication:  Spouse at bedside Disposition Plan:  Home pending above work-up and improvement   Procedures:   Antimicrobials:    Subjective: -Staff reports that she is slightly more confused this morning, not able to answer questions as appropriately -Temp of 100.6 early this morning, no  further diarrhea  Objective: Vitals:   11/12/19 1654 11/12/19 2029 11/13/19 0458 11/13/19 0939  BP: 112/60 115/68 (!) 133/57 118/61   Pulse: 84 80 63 96  Resp: _0 (!) 22  Temp: 99 F (37.2 C) 98.9 F (37.2 C) (!) 100.6 F (38.1 C) 98.5 F (36.9 C)  TempSrc: Oral Oral Oral Oral  SpO2: 93% 95% 90% 92%    Intake/Output Summary (Last 24 hours) at 11/13/2019 1114 Last data filed at 11/13/2019 0940 Gross per 24 hour  Intake 1153.38 ml  Output 450 ml  Net 703.38 ml   There were no vitals filed for this visit.  Examination:  General exam: Frail chronically ill female sitting up in bed, alert awake oriented to self and only partly to place  Respiratory system: Basilar rales Cardiovascular system: S1 & S2 heard, RRR. Gastrointestinal system: Soft, mildly distended, no tenderness, bowel sounds present Central nervous system: Awake and alert, oriented to self and partly to place only, no focal neurological deficits. Extremities: No edema multiple digit amputations in fingers and toes chronic old healing ulcers in lower left lower leg on the foot and lower third of the leg Skin: As above Psychiatry: Appropriate mood and affect    Data Reviewed:   CBC: Recent Labs  Lab 11/08/19 1329 11/10/19 2104 11/12/19 0546 11/13/19 0526  WBC 7.7 8.5 9.1 7.2  HGB 11.4* 11.0* 9.5* 9.9*  HCT 36.5 35.2* 30.8* 30.6*  MCV 106.1* 105.1* 106.9* 102.0*  PLT 266 225 219 315   Basic Metabolic Panel: Recent Labs  Lab 11/08/19 1329 11/10/19 2104 11/11/19 1203 11/11/19 2259 11/12/19 0546 11/13/19 0526  NA 137 133*  --  139 138 137  K 3.1* 3.0*  --  4.0 4.0 5.0  CL 101 96*  --  104 105 106  CO2 21* 24  --  18* 15* 18*  GLUCOSE 92 120*  --  77 89 112*  BUN 17 11  --  _1 CREATININE 0.72 0.98  --  0.98 1.18* 1.23*  CALCIUM 7.2* 6.7*  --  6.7* 6.3* 6.4*  MG  --   --  0.6* 1.1* 0.9*  --   PHOS  --   --   --  3.0 3.0  --    GFR: Estimated Creatinine Clearance: 41.1 mL/min (A) (by C-G formula based on SCr of 1.23 mg/dL (H)). Liver Function Tests: Recent Labs  Lab 11/08/19 1329 11/11/19 1203 11/11/19 2259  11/12/19 0546 11/13/19 0526  AST 32 40 47* 100* 433*  ALT _2 57* 302*  ALKPHOS 64 78 109 173* 212*  BILITOT 0.7 1.3* 1.1 1.2 1.2  PROT 7.3 6.8 6.6 5.9* 6.0*  ALBUMIN 3.6 3.0* 2.8* 2.6* 2.5*   Recent Labs  Lab 11/08/19 1329  LIPASE 109*   No results for input(s): AMMONIA in the last 168 hours. Coagulation Profile: No results for input(s): INR, PROTIME in the last 168 hours. Cardiac Enzymes: No results for input(s): CKTOTAL, CKMB, CKMBINDEX, TROPONINI in the last 168 hours. BNP (last 3 results) No results for input(s): PROBNP in the last 8760 hours. HbA1C: No results for input(s): HGBA1C in the last 72 hours. CBG: No results for input(s): GLUCAP in the last 168 hours. Lipid Profile: No results for input(s): CHOL, HDL, LDLCALC, TRIG, CHOLHDL, LDLDIRECT in the last 72 hours. Thyroid Function Tests: Recent Labs    11/11/19 1640  TSH 1.975   Anemia Panel: No results for input(s): VITAMINB12, FOLATE, FERRITIN, TIBC, IRON, RETICCTPCT  in the last 72 hours. Urine analysis:    Component Value Date/Time   COLORURINE YELLOW 11/10/2019 2102   APPEARANCEUR HAZY (A) 11/10/2019 2102   LABSPEC 1.013 11/10/2019 2102   PHURINE 5.0 11/10/2019 2102   GLUCOSEU NEGATIVE 11/10/2019 2102   HGBUR SMALL (A) 11/10/2019 2102   BILIRUBINUR NEGATIVE 11/10/2019 2102   BILIRUBINUR NEG 12/10/2018 1211   KETONESUR NEGATIVE 11/10/2019 2102   PROTEINUR 100 (A) 11/10/2019 2102   UROBILINOGEN 0.2 12/10/2018 1211   NITRITE NEGATIVE 11/10/2019 2102   LEUKOCYTESUR MODERATE (A) 11/10/2019 2102   Sepsis Labs: _0 (procalcitonin:4,lacticidven:4)  ) Recent Results (from the past 240 hour(s))  Ova and parasite examination STOOL     Status: None   Collection Time: 11/10/19 12:43 PM   Specimen: Stool  Result Value Ref Range Status   MICRO NUMBER: [id number]  Final   SPECIMEN QUALITY: Adequate  Final   Source STOOL  Final   STATUS: FINAL  Final   CONCENTRATE RESULT: No ova or parasites seen   Final   TRICHROME RESULT: No ova or parasites seen  Final   COMMENT:   Final    Routine Ova and Parasite exam may not detect some parasites that occasionally cause diarrheal illness. Test code(s) 63149 (Cryptosporidium Ag., DFA) and/or 10018 (Cyclospora and Isospora Exam) may be ordered to detect these parasites. One negative sample  does not necessarily rule out the presence of a parasitic infection.  For additional information, please refer to https://education.questdiagnostics.com/faq/FAQ203 (This link is being provided for informational/ educational purposes only.)   Stool culture     Status: None (Preliminary result)   Collection Time: 11/10/19 12:43 PM   Specimen: Stool  Result Value Ref Range Status   MICRO NUMBER: [id number]  Final   SPECIMEN QUALITY: Adequate  Final   SOURCE: STOOL  Final   STATUS: FINAL  Final   SHIGA RESULT: Not Detected  Final    Comment: Reference Range:Not Detected   MICRO NUMBER: [id number]  Preliminary   SPECIMEN QUALITY: Adequate  Preliminary   Source STOOL  Preliminary   STATUS: PRELIMINARY  Preliminary   CAM RESULT: Culture in progress  Preliminary   MICRO NUMBER: [id number]  Final   SPECIMEN QUALITY: Adequate  Final   SOURCE: STOOL  Final   STATUS: FINAL  Final   SS RESULT: No Salmonella or Shigella isolated  Final  SARS CORONAVIRUS 2 (TAT 6-24 HRS) Nasopharyngeal Nasopharyngeal Swab     Status: None   Collection Time: 11/11/19  1:35 PM   Specimen: Nasopharyngeal Swab  Result Value Ref Range Status   SARS Coronavirus 2 NEGATIVE NEGATIVE Final    Comment: (NOTE) SARS-CoV-2 target nucleic acids are NOT DETECTED. The SARS-CoV-2 RNA is generally detectable in upper and lower respiratory specimens during the acute phase of infection. Negative results do not preclude SARS-CoV-2 infection, do not rule out co-infections with other pathogens, and should not be used as the sole basis for treatment or other patient management decisions. Negative results must  be combined with clinical observations, patient history, and epidemiological information. The expected result is Negative. Fact Sheet for Patients: SugarRoll.be Fact Sheet for Healthcare Providers: https://www.woods-mathews.com/ This test is not yet approved or cleared by the [place] FDA and  has been authorized for detection and/or diagnosis of SARS-CoV-2 by FDA under an Emergency Use Authorization (EUA). This EUA will remain  in effect (meaning this test can be used) for the duration of the COVID-19 declaration under Section 56 4(b)(1) of the Act, 21 U.S.C.  section 360bbb-3(b)(1), unless the authorization is terminated or revoked sooner. Performed at [facility], [place] [address], [place], [place] [address]          Radiology Studies: Ct Angio Head W Or Wo Contrast  Result Date: 11/11/2019 CLINICAL DATA:  Slurred speech and weakness.  Rule out stroke EXAM: CT ANGIOGRAPHY HEAD AND NECK TECHNIQUE: Multidetector CT imaging of the head and neck was performed using the standard protocol during bolus administration of intravenous contrast. Multiplanar CT image reconstructions and MIPs were obtained to evaluate the vascular anatomy. Carotid stenosis measurements (when applicable) are obtained utilizing NASCET criteria, using the distal internal carotid diameter as the denominator. CONTRAST:  1105m OMNIPAQUE IOHEXOL 350 MG/ML SOLN COMPARISON:  CT head 11/11/2019 FINDINGS: CTA NECK FINDINGS Aortic arch: Atherosclerotic aortic arch. Aorta mass and mildly dilated. Ascending aorta 38 mm in diameter. Atherosclerotic disease in the proximal great vessels without significant stenosis. Right carotid system: Right common carotid artery widely patent. Atherosclerotic calcification right carotid bifurcation. Less than 25% diameter stenosis proximal right internal carotid artery. Left carotid system: Left common carotid artery widely patent. Mild  atherosclerotic calcification left carotid bifurcation without significant carotid stenosis. Vertebral arteries: Both vertebral arteries patent to the basilar. Mild-to-moderate stenosis distal right vertebral artery otherwise no vertebral stenosis. Skeleton: Marked kyphoscoliosis in the thoracic spine. No acute skeletal abnormality. Left occipital craniotomy and cranioplasty. Other neck: Negative for soft tissue mass. Upper chest: Apical scarring. Patchy airspace disease in the lung apices bilaterally which could be scarring or infection. 5 mm left upper lobe nodule Review of the MIP images confirms the above findings CTA HEAD FINDINGS Anterior circulation: Atherosclerotic calcification in the cavernous carotid bilaterally. Mild to moderate stenosis in the cavernous carotid bilaterally. Anterior and middle cerebral arteries patent bilaterally without significant stenosis. Posterior circulation: Mild to moderate stenosis distal right vertebral artery. Left vertebral patent. PICA patent bilaterally. Basilar widely patent. Superior cerebellar and posterior cerebral arteries patent bilaterally. Venous sinuses: Patent Anatomic variants: None Review of the MIP images confirms the above findings IMPRESSION: 1. Atherosclerotic calcification carotid bifurcation without significant stenosis. Mild to moderate stenosis cavernous carotid bilaterally 2. Mild to moderate stenosis distal right vertebral artery. Left vertebral artery widely patent 3. No intracranial large vessel occlusion. Electronically Signed   By: CFranchot GalloM.D.   On: 11/11/2019 13:05   Ct Head Wo Contrast  Result Date: 11/11/2019 CLINICAL DATA:  Altered mental status. EXAM: CT HEAD WITHOUT CONTRAST TECHNIQUE: Contiguous axial images were obtained from the base of the skull through the vertex without intravenous contrast. COMPARISON:  Brain MRI 11/29/2017 FINDINGS: Brain: Ventricles, cisterns and other CSF spaces are within normal. There is mild chronic  ischemic microvascular disease. Stable focus of encephalomalacia over the right cerebellar hemisphere. No mass, mass effect, shift of midline structures or acute hemorrhage. No evidence of acute infarction. Vascular: No hyperdense vessel or unexpected calcification. Skull: Previous right occipital craniotomy unchanged. Sinuses/Orbits: No acute finding. Other: None. IMPRESSION: 1.  No acute findings. 2. Mild chronic ischemic microvascular disease. Postsurgical change right cerebellar hemisphere with postsurgical change of the right occipital skull. Electronically Signed   By: DMarin OlpM.D.   On: 11/11/2019 11:57   Ct Angio Neck W Or Wo Contrast  Result Date: 11/11/2019 CLINICAL DATA:  Slurred speech and weakness.  Rule out stroke EXAM: CT ANGIOGRAPHY HEAD AND NECK TECHNIQUE: Multidetector CT imaging of the head and neck was performed using the standard protocol during bolus administration of intravenous contrast. Multiplanar CT image  reconstructions and MIPs were obtained to evaluate the vascular anatomy. Carotid stenosis measurements (when applicable) are obtained utilizing NASCET criteria, using the distal internal carotid diameter as the denominator. CONTRAST:  188m OMNIPAQUE IOHEXOL 350 MG/ML SOLN COMPARISON:  CT head 11/11/2019 FINDINGS: CTA NECK FINDINGS Aortic arch: Atherosclerotic aortic arch. Aorta mass and mildly dilated. Ascending aorta 38 mm in diameter. Atherosclerotic disease in the proximal great vessels without significant stenosis. Right carotid system: Right common carotid artery widely patent. Atherosclerotic calcification right carotid bifurcation. Less than 25% diameter stenosis proximal right internal carotid artery. Left carotid system: Left common carotid artery widely patent. Mild atherosclerotic calcification left carotid bifurcation without significant carotid stenosis. Vertebral arteries: Both vertebral arteries patent to the basilar. Mild-to-moderate stenosis distal right  vertebral artery otherwise no vertebral stenosis. Skeleton: Marked kyphoscoliosis in the thoracic spine. No acute skeletal abnormality. Left occipital craniotomy and cranioplasty. Other neck: Negative for soft tissue mass. Upper chest: Apical scarring. Patchy airspace disease in the lung apices bilaterally which could be scarring or infection. 5 mm left upper lobe nodule Review of the MIP images confirms the above findings CTA HEAD FINDINGS Anterior circulation: Atherosclerotic calcification in the cavernous carotid bilaterally. Mild to moderate stenosis in the cavernous carotid bilaterally. Anterior and middle cerebral arteries patent bilaterally without significant stenosis. Posterior circulation: Mild to moderate stenosis distal right vertebral artery. Left vertebral patent. PICA patent bilaterally. Basilar widely patent. Superior cerebellar and posterior cerebral arteries patent bilaterally. Venous sinuses: Patent Anatomic variants: None Review of the MIP images confirms the above findings IMPRESSION: 1. Atherosclerotic calcification carotid bifurcation without significant stenosis. Mild to moderate stenosis cavernous carotid bilaterally 2. Mild to moderate stenosis distal right vertebral artery. Left vertebral artery widely patent 3. No intracranial large vessel occlusion. Electronically Signed   By: CFranchot GalloM.D.   On: 11/11/2019 13:05   Dg Chest Portable 1 View  Result Date: 11/11/2019 CLINICAL DATA:  Shortness of breath. EXAM: PORTABLE CHEST 1 VIEW COMPARISON:  Chest x-ray May 08, 2016. CT scan of the chest January 21, 2019 FINDINGS: Bilateral interstitial opacities are again identified and similar since the January 2020 CT scan. Prominence of the hila is likely due to pulmonary arterial hypertension given previous CT imaging. Stable cardiomegaly. No pneumothorax. No other acute abnormalities are identified. IMPRESSION: 1. Diffuse slightly nodular ground-glass opacities in the lungs are similar  compared to January 2020 but increased compared to 2017. 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 No overt edema today. 2. Enlarged hila likely due to pulmonary arterial hypertension given previous CT imaging. Electronically Signed   By: DDorise BullionIII M.D   On: 11/11/2019 13:15   UK[place]Abdomen Limited Ruq  Result Date: 11/13/2019 CLINICAL DATA:  Elevated LFTs. EXAM: ULTRASOUND ABDOMEN LIMITED RIGHT UPPER QUADRANT COMPARISON:  None. FINDINGS: Gallbladder: The gallbladder wall is upper limits of normal in thickness measuring 3 mm. Stone within the gallbladder is identified measuring 1.7 cm. No pericholecystic fluid. Negative sonographic Murphy's sign Common bile duct: Diameter: 2.3 mm Liver: No focal lesion identified. Within normal limits in parenchymal echogenicity. Portal vein is patent on color Doppler imaging with normal direction of blood flow towards the liver. Other: None. IMPRESSION: 1. Gallstone. Gallbladder wall thickness is upper limits of normal measuring 3 mm. If there is a high clinical concern for acute cholecystitis consider further evaluation with nuclear medicine hepatic biliary scan Electronically Signed   By: TKerby MoorsM.D.   On: 11/13/2019 09:41  Scheduled Meds: . citalopram  20 mg Oral Daily  . clopidogrel  75 mg Oral Daily  . cycloSPORINE  1 drop Both Eyes BID  . enoxaparin (LOVENOX) injection  40 mg Subcutaneous Q24H  . gabapentin  300 mg Oral QHS  . iron polysaccharides  150 mg Oral BID  . levothyroxine  50 mcg Oral QAC breakfast  . macitentan  10 mg Oral Daily  . magnesium oxide  200 mg Oral BID  . NIFEdipine  30 mg Oral Daily  . pantoprazole  40 mg Oral BID  . predniSONE  5 mg Oral Daily  . saccharomyces boulardii  250 mg Oral BID  . sildenafil  60 mg Oral TID  . sodium chloride flush  3 mL Intravenous Once  . vitamin B-12  1,000 mcg Oral Daily   Continuous Infusions:     LOS: 1 day    Time spent: 34mn    PDomenic Polite MD [facility]  11/13/2019, 11:14 AM

## 2019-11-13 NOTE — Evaluation (Signed)
Occupational Therapy Evaluation Patient Details Name: [name] [name] MRN: [id number] DOB: 1951/07/13 Today's Date: 11/13/2019    History of Present Illness 68 yo female admitted to ED on 11/10 with AMS, N/V and diarrhea over the course of the past week. Pt initially working up for CVA, neuro feels CVA is doubtful and metabolic encephalopathy due to electrolyte imbalance is more likely. EEG negative for acute findings. PMH inlcudes scleroderma with multiple R finger amputations, dyspnea on exertion on chronic O2 2-3L, HTN, melanoma, PAD with history of femoral endarterectomy and LE venous ablations, osteoporosis, ILD, CKD, foot ulceration.   Clinical Impression   Patient lives with her spouse in a townhouse, stays on main level. Patient was fully independent prior to admission, not using any adaptive devices. Currently patient demonstrates decreased standing balance, weakness, decreased safety awareness during functional tasks. Patient require min/mod A for safe stand pivot transfer from edge of bed to bedside chair.     Follow Up Recommendations  SNF;Supervision/Assistance - 24 hour;Other (comment)(pt/spouse declining SNF, agreeable to [facility])    Equipment Recommendations  3 in 1 bedside commode, rolling walker       Precautions / Restrictions Precautions Precautions: Fall Restrictions Weight Bearing Restrictions: No      Mobility Bed Mobility Overal bed mobility: Needs Assistance Bed Mobility: Supine to Sit     Supine to sit: Mod assist     General bed mobility comments: patient still groggy requiring max verbal cues to sequence/initiate and mod A to complete bed mobility  Transfers Overall transfer level: Needs assistance Equipment used: 1 person hand held assist Transfers: Stand Pivot Transfers   Stand pivot transfers: Min assist;Mod assist       General transfer comment: decreased safety due to impaired standing balance    Balance Overall balance assessment: Needs  assistance Sitting-balance support: Feet supported;No upper extremity supported Sitting balance-Leahy Scale: Fair     Standing balance support: Single extremity supported;During functional activity Standing balance-Leahy Scale: Poor Standing balance comment: reliant on external support in standing, especially dynamically                           ADL either performed or assessed with clinical judgement   ADL Overall ADL's : Needs assistance/impaired Eating/Feeding: Sitting;Independent(patient manage utensils to feed herself breakfast)   Grooming: Set up;Sitting   Upper Body Bathing: Min guard;Sitting   Lower Body Bathing: Moderate assistance;Sit to/from stand;Sitting/lateral leans   Upper Body Dressing : Min guard;Sitting(pt have minor loss of balance in sitting when leaning back)   Lower Body Dressing: Total assistance;Sitting/lateral leans(to don socks seated edge of bed)   Toilet Transfer: Minimal assistance;Moderate assistance;Stand-pivot;BSC Toilet Transfer Details (indicate cue type and reason): simulated with transfer to bedside chair, verbal cues for sequencing and safety Toileting- Clothing Manipulation and Hygiene: Maximal assistance;Sit to/from stand Toileting - Clothing Manipulation Details (indicate cue type and reason): due to decreased standing balance                          Pertinent Vitals/Pain Pain Assessment: No/denies pain     Hand Dominance Left(pt now uses her L hand due to 3-5th digit amputations on R)   Extremity/Trunk Assessment Upper Extremity Assessment Upper Extremity Assessment: Generalized weakness   Lower Extremity Assessment Lower Extremity Assessment: Defer to PT evaluation   Cervical / Trunk Assessment Cervical / Trunk Assessment: Kyphotic   Communication Communication Communication: No difficulties   Cognition Arousal/Alertness:  Awake/alert(initially lethargic with bed mobility pt maintain arousal) Behavior  During Therapy: WFL for tasks assessed/performed Overall Cognitive Status: No family/caregiver present to determine baseline cognitive functioning                                 General Comments: patient A/O x4              Home Living Family/patient expects to be discharged to:: Private residence Living Arrangements: Spouse/significant other Available Help at Discharge: Family;Available 24 hours/day Type of Home: House Home Access: Stairs to enter [employer] of Steps: 2   Home Layout: Able to live on main level with bedroom/bathroom     Bathroom Shower/Tub: Occupational psychologist: Standard     Home Equipment: Environmental consultant - 2 wheels;Bedside commode;Wheelchair - manual          Prior Functioning/Environment Level of Independence: Independent(patient reports her spouse helped her as needed at home)        Comments: Per pt and husband, pt was 100% independent with mobility and ADLs PTA, had just slowed down due to recent GI virus. Pt with no history of falls.        OT Problem List: Decreased strength;Decreased activity tolerance;Impaired balance (sitting and/or standing);Decreased safety awareness      OT Treatment/Interventions: Self-care/ADL training;Therapeutic exercise;DME and/or AE instruction;Therapeutic activities;Patient/family education;Balance training    OT Goals(Current goals can be found in the care plan section) Acute Rehab OT Goals Patient Stated Goal: to go home OT Goal Formulation: With patient Time For Goal Achievement: 11/27/19 Potential to Achieve Goals: Good  OT Frequency: Min 2X/week    AM-PAC OT "6 Clicks" Daily Activity     Outcome Measure Help from another person eating meals?: None Help from another person taking care of personal grooming?: A Little Help from another person toileting, which includes using toliet, bedpan, or urinal?: A Lot Help from another person bathing (including washing, rinsing,  drying)?: A Lot Help from another person to put on and taking off regular upper body clothing?: A Little Help from another person to put on and taking off regular lower body clothing?: Total 6 Click Score: 15   End of Session Equipment Utilized During Treatment: Oxygen Nurse Communication: Other (comment)(notified NA patient in chair with spouse present)  Activity Tolerance: Patient tolerated treatment well Patient left: in chair;with call bell/phone within reach;with family/visitor present;Other (comment)(asked spouse to notify RN if he leaves the room)  OT Visit Diagnosis: Unsteadiness on feet (R26.81);Other abnormalities of gait and mobility (R26.89);Muscle weakness (generalized) (M62.81)                Time: 2876-8115 OT Time Calculation (min): 27 min Charges:  OT General Charges $OT Visit: 1 Visit OT Evaluation $OT Eval Moderate Complexity: 1 Mod OT Treatments $Self Care/Home Management : 8-22 mins  Shon Millet OT OT office: [place] 11/13/2019, 11:41 AM

## 2019-11-13 NOTE — Consult Note (Signed)
[name] [name] [birth year]  [id number].    Requesting MD: Dr. Domenic Polite  Chief Complaint/Reason for Consult: Elevated LFT's, GB wall thickening on RUS   HPI: [name] [name] is a 68 y.o. 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  The husband provides all information as the patient is very sleepy and dozes off for the entirety of my visit.  Patient's lethargy and acute metabolic encephalopathy was thought to be secondary to dehydration and high dose gabapentin as last Thursday she started have N/V.  She was seen at [facility] and given zofran which resolved her symptoms.  Her mental status is improved since admission.  On 11/11 she did have a low-grade fever 100.1.  Last night she ran a temperature of 100.6.  LFTs been uptrending since admission.  Alk phos 109>173>212., AST 47>100>433. ALT 29>57>302. T bili wnl at 1.2. WBC 7.2.  Right upper quadrant ultrasound was obtained with gallstones and gallbladder wall thickening at upper limit normal, 3 mm.  There is no evidence of pericholecystic fluid.  CBD 2.3 mm.  Patient currently without any abdominal pain, nausea or emesis.  She just had significant anorexia.  She does have some diarrhea.  The husband thinks about 1-2 episodes of loose stool per day.  She is currently on a regular diet but not eating.  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  We are asked to see.  No prior abdominal surgeries.  ROS: ROS: Please see HPI, otherwise the patient was unable to perform a ROS due to lethargy.  Her husband does  admit to chronic RLE wounds that she sees the wound clinic for.  Family History  Problem Relation Age of Onset   Lupus Father    Lymphoma Father    Hyperlipidemia Mother    Cancer Daughter        sarcoma   Colon cancer Other     Past Medical History:  Diagnosis Date   Anemia    Arthritis    DOE (dyspnea on exertion)    Epistaxis    History of chicken pox    Hypertension    Hypothyroidism    Leg ulcer ([place])    Melanoma ([place]) 1999   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[place])    Non Hodgkin's lymphoma ([place])    Treated with chemo 2010, felt to be cured.   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[facility])     Past Surgical History:  Procedure Laterality Date   AMPUTATION Right 10/24/2017   Procedure: RAY resection right index;  Surgeon: Daryll Brod, MD;  Location: [place];  Service: Orthopedics;  Laterality: Right;  Axillary block   AMPUTATION Right 11/21/2018   Procedure: AMPUTATION RAY RIGHT MIDDLE FINGER;  Surgeon: Daryll Brod, MD;  Location: [place];  Service: Orthopedics;  Laterality: Right;   AMPUTATION Right 10/22/2019   Procedure: AMPUTATION DIGIT RIGHT  RING AND SMALL FINGER, DIGITAL SYMPATHECTOMY RIGHT RADIAL ULNAR ARCH;  Surgeon: Daryll Brod, MD;  Location: [place];  Service: Orthopedics;  Laterality: Right;  AXILARY   apligraph  12-04-2102,   12-18-2012   bilateral calves   [name]   biopsy on cerebellum     COLONOSCOPY WITH PROPOFOL Left 10/07/2015   Procedure: COLONOSCOPY WITH PROPOFOL;  Surgeon: [name], MD;  Location: [place];  Service: Endoscopy;  Laterality: Left;   ENDARTERECTOMY FEMORAL Right 09/14/2016   Procedure: ENDARTERECTOMY FEMORAL RIGHT;  Surgeon: [name], MD;  Location[facility] OR;  Service: Vascular;  Laterality: Right;    ENDOVENOUS ABLATION SAPHENOUS VEIN W/ LASER  07-24-2012   right greater saphenous vein by [name], M.D.    ENDOVENOUS ABLATION SAPHENOUS VEIN W/ LASER  10-16-2012   left greater saphenous vein  by Dr. [name]   ENDOVENOUS ABLATION SAPHENOUS VEIN W/ LASER Right 09-01-2015   endovenous laser ablation right greater saphenous vein by [name] MD   ESOPHAGOGASTRODUODENOSCOPY N/A 05/07/2016   Procedure: ESOPHAGOGASTRODUODENOSCOPY (EGD);  Surgeon: [name], MD;  Location: [facility] ENDOSCOPY;  Service: Endoscopy;  Laterality: N/A;   ESOPHAGOGASTRODUODENOSCOPY (EGD) WITH PROPOFOL Left 10/07/2015   Procedure: ESOPHAGOGASTRODUODENOSCOPY (EGD) WITH PROPOFOL;  Surgeon: [name], MD;  Location: [place];  Service: Endoscopy;  Laterality: Left;   GIVENS CAPSULE STUDY N/A 07/31/2016   Procedure: GIVENS CAPSULE STUDY;  Surgeon: [name], MD;  Location: [place];  Service: Endoscopy;  Laterality: N/A;   melonoma removes Right    behind right knee   PATCH ANGIOPLASTY Right 09/14/2016   Procedure: PATCH ANGIOPLASTY RIGHT FEMORAL ARTERY USING HEMASHIELD PLATINUM FINESSE PATCH;  Surgeon: [name], MD;  Location: [place];  Service: Vascular;  Laterality: Right;   PERIPHERAL VASCULAR CATHETERIZATION N/A 09/07/2016   Procedure: Abdominal Aortogram w/Lower Extremity;  Surgeon: [name], MD;  Location: [place] CV LAB;  Service: Cardiovascular;  Laterality: N/A;   RIGHT HEART CATH N/A 04/19/2017   Procedure: Right Heart Cath;  Surgeon: [name], MD;  Location: [place] CV LAB;  Service: Cardiovascular;  Laterality: N/A;   RIGHT HEART CATHETERIZATION N/A 11/14/2012   Procedure: RIGHT HEART CATH;  Surgeon: [name], MD;  Location: [facility] CATH LAB;  Service: Cardiovascular;  Laterality: N/A;   rt little toe removal  10/1998   stent left thigh  3/11   TENDON TRANSFER Right 10/24/2017   Procedure: transfer extensor indecus propius/extensor digitorum commomus index to  middle ring and small;  Surgeon: Daryll Brod, MD;  Location: [place];  Service: Orthopedics;  Laterality: Right;   TONSILLECTOMY     TONSILLECTOMY AND ADENOIDECTOMY     TUNNELED VENOUS CATHETER PLACEMENT  01/2013   ULNAR HEAD EXCISION Right 10/24/2017   Procedure: darrach right ulna;  Surgeon: Daryll Brod, MD;  Location: [place];  Service: Orthopedics;  Laterality: Right;   VEIN LIGATION AND STRIPPING  05/16/2012   Procedure: VEIN LIGATION AND STRIPPING;  Surgeon: [name], MD;  Location: [facility] OR;  Service: Vascular;  Laterality: Right;  Irrigation and Debridement right lower leg, ligation of saphenous vein.    Social History:  reports that she has never smoked. She has never used smokeless tobacco. She reports current alcohol use. She reports that she does not use drugs.  Allergies:  Allergies  Allergen Reactions   Levofloxacin Other (See Comments)    FLU LIKE SYMPTOM Other reaction(s): Other (See Comments) FLU LIKE SYMPTOM   Sulfa Antibiotics  Rash and Other (See Comments)    Face peeled Severe rash with significant peeling of face. No airway involvement per patient Face peeled   Levofloxacin In D5w Other (See Comments)    FLU LIKE SYMPTOMS FLU LIKE SYMPTOMS   Doxycycline Hyclate Swelling    SWELLING REACTION UNSPECIFIED  "TABLETS ONLY - CAPSULES ARE TOLERATED FINE"   Penicillins Rash    Has patient had a PCN reaction causing immediate rash, facial/tongue/throat swelling, SOB or lightheadedness with hypotension: no Has patient had a PCN reaction causing severe rash involving mucus membranes or skin necrosis: no Has patient had a PCN reaction that required hospitalization: no Has patient had a PCN reaction occurring within the last 10 years: {no If all of the above answers are "NO", then may proceed with Cephalosporin use.    Medications Prior to Admission  Medication Sig Dispense Refill   acetaminophen (TYLENOL ARTHRITIS PAIN) 650 MG CR tablet Take 1,300 mg by mouth  every 8 (eight) hours.      atorvastatin (LIPITOR) 20 MG tablet Take 1 tablet (20 mg total) by mouth daily. 90 tablet 3   Biotin 5000 MCG TABS Take 5,000 mcg by mouth daily.     CALCIUM-MAGNESIUM-VITAMIN D PO Take 2 tablets by mouth daily.     Cholecalciferol (VITAMIN D) 2000 units CAPS Take 2,000 Units by mouth daily.     citalopram (CELEXA) 20 MG tablet TAKE 1 TABLET BY MOUTH EVERYDAY AT BEDTIME (Patient taking differently: Take 20 mg by mouth daily. ) 90 tablet 3   clopidogrel (PLAVIX) 75 MG tablet TAKE 1 TABLET BY MOUTH EVERY DAY (Patient taking differently: Take 75 mg by mouth daily. ) 90 tablet 3   FERREX 150 150 MG capsule TAKE ONE CAPSULE BY MOUTH TWICE A DAY (Patient taking differently: Take 150 mg by mouth 2 (two) times daily. ) 180 capsule 3   fish oil-omega-3 fatty acids 1000 MG capsule Take 1,000 mg by mouth 2 (two) times daily.      gabapentin (NEURONTIN) 300 MG capsule Take 600 mg by mouth 2 (two) times daily.     HYDROcodone-acetaminophen (NORCO) 5-325 MG tablet Take 1 tablet by mouth every 6 (six) hours as needed for moderate pain. 30 tablet 0   levothyroxine (SYNTHROID) 50 MCG tablet TAKE 1 TABLET BY MOUTH DAILY BEFORE BREAKFAST (Patient taking differently: Take 50 mcg by mouth daily before breakfast. ) 90 tablet 1   Multiple Vitamin (MULTIVITAMIN WITH MINERALS) TABS tablet Take 1 tablet by mouth daily.     NIFEdipine (ADALAT CC) 30 MG 24 hr tablet Take 30 mg by mouth daily.      ondansetron (ZOFRAN ODT) 4 MG disintegrating tablet Take 1 tablet (4 mg total) by mouth every 8 (eight) hours as needed for nausea or vomiting. 10 tablet 0   OPSUMIT 10 MG tablet Take 1 tablet (80m) by mouth daily. 30 tablet 11   OXYGEN Inhale 2-3 L into the lungs continuous.      pantoprazole (PROTONIX) 40 MG tablet Take 1 tablet (40 mg total) by mouth 2 (two) times daily. 60 tablet 1   potassium chloride 20 MEQ/15ML (10%) SOLN Take 40 mEq by mouth daily.     predniSONE (DELTASONE) 5  MG tablet Take 5 mg by mouth daily.     Probiotic CAPS Take 1 capsule by mouth daily.     RESTASIS 0.05 % ophthalmic emulsion Place 1 drop into both eyes 2 (two) times daily.     sildenafil (REVATIO) 20 MG tablet  Take 3 tablets (60 mg total) by mouth 3 (three) times daily. 810 tablet 1   spironolactone (ALDACTONE) 25 MG tablet TAKE 1 TABLET BY MOUTH EVERY DAY 90 tablet 1   torsemide (DEMADEX) 20 MG tablet Take 86m in the AM and 210min the PM 360 tablet 3   valACYclovir (VALTREX) 500 MG tablet Take 1 tablet (500 mg total) by mouth 2 (two) times daily. (Patient taking differently: Take 500 mg by mouth 2 (two) times daily as needed (cold sores). ) 10 tablet 0   vitamin B-12 (CYANOCOBALAMIN) 1000 MCG tablet Take 1,000 mcg by mouth daily.       Physical Exam: Blood pressure 118/61, pulse 96, temperature 98.5 F (36.9 C), temperature source Oral, resp. rate (!) 22, SpO2 92 %. General: pleasant, WD, WN white female who is sitting up in a chair sleeping, but in NAD HEENT: head is normocephalic, atraumatic.  Sclera are noninjected.  PERRL.  Ears and nose without any masses or lesions.  Mouth is pink and dry Heart: regular, rate, and rhythm.  Normal s1,s2. No obvious murmurs, gallops, or rubs noted.  Palpable radial and pedal pulses bilaterally Lungs: CTAB, no wheezes, rhonchi, or rales noted.  Respiratory effort nonlabored Abd: soft, NT, ND, +BS, no masses, hernias, or organomegaly MS: all 4 extremities are symmetrical with no cyanosis, clubbing, but she does have some LE edema.  She is missing several digits from her right hand. Skin: warm and dry with no masses, lesions, or rashes, but some chronic venous stasis changes to her BLE Psych: sleepy/lethargic, doesn't interact much as she just goes back to sleep   Results for orders placed or performed during the hospital encounter of 11/10/19 (from the past 48 hour(s))  Troponin I (High Sensitivity)     Status: Abnormal   Collection Time:  11/11/19 12:03 PM  Result Value Ref Range   Troponin I (High Sensitivity) 30 (H) <18 ng/L    Comment: (NOTE) Elevated high sensitivity troponin I (hsTnI) values and significant  changes across serial measurements may suggest ACS but many other  chronic and acute conditions are known to elevate hsTnI results.  Refer to the "Links" section for chest pain algorithms and additional  guidance. Performed at [facility] [place] [place] [id number] Hepatic function panel     Status: Abnormal   Collection Time: 11/11/19 12:03 PM  Result Value Ref Range   Total Protein 6.8 6.5 - 8.1 g/dL   Albumin 3.0 (L) 3.5 - 5.0 g/dL   AST 40 15 - 41 U/L   ALT 26 0 - 44 U/L   Alkaline Phosphatase 78 38 - 126 U/L   Total Bilirubin 1.3 (H) 0.3 - 1.2 mg/dL   Bilirubin, Direct 0.3 (H) 0.0 - 0.2 mg/dL   Indirect Bilirubin 1.0 (H) 0.3 - 0.9 mg/dL    Comment: Performed at [address] [place] [id number]     Status: Abnormal   Collection Time: 11/11/19 12:03 PM  Result Value Ref Range   Magnesium 0.6 (LL) 1.7 - 2.4 mg/dL    Comment: CRITICAL RESULT CALLED TO, READ BACK BY AND VERIFIED WITH: A Gertha CalkinN [id number] A BENNETT Performed at [facility] [place] SARS CORONAVIRUS 2 (TAT 6-24 HRS) Nasopharyngeal Nasopharyngeal Swab     Status: None   Collection Time: 11/11/19  1:35 PM   Specimen: Nasopharyngeal  Swab  Result Value Ref Range   SARS Coronavirus 2 NEGATIVE NEGATIVE    Comment: (NOTE) SARS-CoV-2 target nucleic acids are NOT DETECTED. The SARS-CoV-2 RNA is generally detectable in upper and lower respiratory specimens during the acute phase of infection. Negative results do not preclude SARS-CoV-2 infection, do not rule out co-infections with other pathogens, and should not be used as the sole basis for treatment or other patient management decisions. Negative results must be combined  with clinical observations, patient history, and epidemiological information. The expected result is Negative. Fact Sheet for Patients: SugarRoll.be Fact Sheet for Healthcare Providers: https://www.woods-mathews.com/ This test is not yet approved or cleared by the [place] FDA and  has been authorized for detection and/or diagnosis of SARS-CoV-2 by FDA under an Emergency Use Authorization (EUA). This EUA will remain  in effect (meaning this test can be used) for the duration of the COVID-19 declaration under Section 56 4(b)(1) of the Act, 21 U.S.C. section 360bbb-3(b)(1), unless the authorization is terminated or revoked sooner. Performed at [facility], [place] [address], [place], [place] [address]   TSH     Status: None   Collection Time: 11/11/19  4:40 PM  Result Value Ref Range   TSH 1.975 0.350 - 4.500 uIU/mL    Comment: Performed by a 3rd Generation assay with a functional sensitivity of <=0.01 uIU/mL. Performed at [facility], [place] [address] [place], [place] [address]   HIV Antibody (routine testing w rflx)     Status: None   Collection Time: 11/11/19  9:54 PM  Result Value Ref Range   HIV Screen 4th Generation wRfx NON REACTIVE NON REACTIVE    Comment: Performed at [facility] [address], [place], [place] [address]  Comprehensive metabolic panel     Status: Abnormal   Collection Time: 11/11/19 10:59 PM  Result Value Ref Range   Sodium 139 135 - 145 mmol/L   Potassium 4.0 3.5 - 5.1 mmol/L   Chloride 104 98 - 111 mmol/L   CO2 18 (L) 22 - 32 mmol/L   Glucose, Bld 77 70 - 99 mg/dL   BUN 13 8 - 23 mg/dL   Creatinine, Ser 0.98 0.44 - 1.00 mg/dL   Calcium 6.7 (L) 8.9 - 10.3 mg/dL   Total Protein 6.6 6.5 - 8.1 g/dL   Albumin 2.8 (L) 3.5 - 5.0 g/dL   AST 47 (H) 15 - 41 U/L   ALT 29 0 - 44 U/L   Alkaline Phosphatase 109 38 - 126 U/L   Total Bilirubin 1.1 0.3 - 1.2 mg/dL   GFR calc non Af Amer 58 (L) >60  mL/min   GFR calc Af Amer >60 >60 mL/min   Anion gap 17 (H) 5 - 15    Comment: Performed at [facility], [place] [address] [place], [place] [address]  Magnesium     Status: Abnormal   Collection Time: 11/11/19 10:59 PM  Result Value Ref Range   Magnesium 1.1 (L) 1.7 - 2.4 mg/dL    Comment: Performed at [facility] [address], [place], [place] [address]  Phosphorus     Status: None   Collection Time: 11/11/19 10:59 PM  Result Value Ref Range   Phosphorus 3.0 2.5 - 4.6 mg/dL    Comment: Performed at [facility] [address] [place], [place] [address]  Comprehensive metabolic panel     Status: Abnormal   Collection Time: 11/12/19  5:46 AM  Result Value Ref Range   Sodium 138 135 - 145 mmol/L   Potassium 4.0 3.5 - 5.1 mmol/L   Chloride 105 98 - 111 mmol/L   CO2 15 (L) 22 - 32 mmol/L   Glucose, Bld 89 70 - 99 mg/dL   BUN 13 8 - 23 mg/dL   Creatinine, Ser 1.18 (H) 0.44 - 1.00 mg/dL   Calcium 6.3 (LL) 8.9 - 10.3 mg/dL    Comment: CRITICAL RESULT CALLED TO, READ BACK BY AND VERIFIED WITH: KAMI MOORE,RN AT 8119 11/12/2019 BY ZBEECH.    Total Protein 5.9 (L) 6.5 - 8.1 g/dL   Albumin 2.6 (L) 3.5 - 5.0 g/dL   AST 100 (H) 15 - 41 U/L   ALT 57 (H) 0 - 44 U/L   Alkaline Phosphatase 173 (H) 38 - 126 U/L   Total Bilirubin 1.2 0.3 - 1.2 mg/dL   GFR calc non Af Amer 47 (L) >60 mL/min   GFR calc Af Amer 54 (L) >60 mL/min   Anion gap 18 (H) 5 - 15    Comment: Performed at [facility] [address], [place], [place] [address]  CBC     Status: Abnormal   Collection Time: 11/12/19  5:46 AM  Result Value Ref Range   WBC 9.1 4.0 - 10.5 K/uL   RBC 2.88 (L) 3.87 - 5.11 MIL/uL   Hemoglobin 9.5 (L) 12.0 - 15.0 g/dL   HCT 30.8 (L) 36.0 - 46.0 %   MCV 106.9 (H) 80.0 - 100.0 fL   MCH 33.0 26.0 - 34.0 pg   MCHC 30.8 30.0 - 36.0 g/dL   RDW 13.3 11.5 - 15.5 %   Platelets 219 150 - 400 K/uL   nRBC 0.0 0.0 - 0.2 %    Comment: Performed at [facility],  [place] [address], [place], [place] [address]  Magnesium     Status: Abnormal   Collection Time: 11/12/19  5:46 AM  Result Value Ref Range   Magnesium 0.9 (LL) 1.7 - 2.4 mg/dL    Comment: CRITICAL RESULT CALLED TO, READ BACK BY AND VERIFIED WITH: KAMI MOORE,RN AT 3086 11/12/2019 BY ZBEECH. Performed at [facility], [place] [address], [place], [place] [address]   Phosphorus     Status: None   Collection Time: 11/12/19  5:46 AM  Result Value Ref Range   Phosphorus 3.0 2.5 - 4.6 mg/dL    Comment: Performed at [facility] [address], [place], [place] [address]  CBC     Status: Abnormal   Collection Time: 11/13/19  5:26 AM  Result Value Ref Range   WBC 7.2 4.0 - 10.5 K/uL   RBC 3.00 (L) 3.87 - 5.11 MIL/uL   Hemoglobin 9.9 (L) 12.0 - 15.0 g/dL   HCT 30.6 (L) 36.0 - 46.0 %   MCV 102.0 (H) 80.0 - 100.0 fL   MCH 33.0 26.0 - 34.0 pg   MCHC 32.4 30.0 - 36.0 g/dL   RDW 13.3 11.5 - 15.5 %   Platelets 256 150 - 400 K/uL   nRBC 0.0 0.0 - 0.2 %    Comment: Performed at [facility], [place] [address], [place], [place] [address]  Comprehensive metabolic panel     Status: Abnormal   Collection Time: 11/13/19  5:26 AM  Result Value Ref Range   Sodium 137 135 - 145 mmol/L   Potassium 5.0 3.5 - 5.1 mmol/L   Chloride 106  98 - 111 mmol/L   CO2 18 (L) 22 - 32 mmol/L   Glucose, Bld 112 (H) 70 - 99 mg/dL   BUN 15 8 - 23 mg/dL   Creatinine, Ser 1.23 (H) 0.44 - 1.00 mg/dL   Calcium 6.4 (LL) 8.9 - 10.3 mg/dL    Comment: CRITICAL RESULT CALLED TO, READ BACK BY AND VERIFIED WITH: A.MENETZ,RN 11/13/2019 0626 DAVISB    Total Protein 6.0 (L) 6.5 - 8.1 g/dL   Albumin 2.5 (L) 3.5 - 5.0 g/dL   AST 433 (H) 15 - 41 U/L   ALT 302 (H) 0 - 44 U/L   Alkaline Phosphatase 212 (H) 38 - 126 U/L   Total Bilirubin 1.2 0.3 - 1.2 mg/dL   GFR calc non Af Amer 44 (L) >60 mL/min   GFR calc Af Amer 51 (L) >60 mL/min   Anion gap 13 5 - 15    Comment: Performed at [facility] [address],  [place], [place] [address]   Ct Angio Head W Or Wo Contrast  Result Date: 11/11/2019 CLINICAL DATA:  Slurred speech and weakness.  Rule out stroke EXAM: CT ANGIOGRAPHY HEAD AND NECK TECHNIQUE: Multidetector CT imaging of the head and neck was performed using the standard protocol during bolus administration of intravenous contrast. Multiplanar CT image reconstructions and MIPs were obtained to evaluate the vascular anatomy. Carotid stenosis measurements (when applicable) are obtained utilizing NASCET criteria, using the distal internal carotid diameter as the denominator. CONTRAST:  190m OMNIPAQUE IOHEXOL 350 MG/ML SOLN COMPARISON:  CT head 11/11/2019 FINDINGS: CTA NECK FINDINGS Aortic arch: Atherosclerotic aortic arch. Aorta mass and mildly dilated. Ascending aorta 38 mm in diameter. Atherosclerotic disease in the proximal great vessels without significant stenosis. Right carotid system: Right common carotid artery widely patent. Atherosclerotic calcification right carotid bifurcation. Less than 25% diameter stenosis proximal right internal carotid artery. Left carotid system: Left common carotid artery widely patent. Mild atherosclerotic calcification left carotid bifurcation without significant carotid stenosis. Vertebral arteries: Both vertebral arteries patent to the basilar. Mild-to-moderate stenosis distal right vertebral artery otherwise no vertebral stenosis. Skeleton: Marked kyphoscoliosis in the thoracic spine. No acute skeletal abnormality. Left occipital craniotomy and cranioplasty. Other neck: Negative for soft tissue mass. Upper chest: Apical scarring. Patchy airspace disease in the lung apices bilaterally which could be scarring or infection. 5 mm left upper lobe nodule Review of the MIP images confirms the above findings CTA HEAD FINDINGS Anterior circulation: Atherosclerotic calcification in the cavernous carotid bilaterally. Mild to moderate stenosis in the cavernous carotid bilaterally. Anterior  and middle cerebral arteries patent bilaterally without significant stenosis. Posterior circulation: Mild to moderate stenosis distal right vertebral artery. Left vertebral patent. PICA patent bilaterally. Basilar widely patent. Superior cerebellar and posterior cerebral arteries patent bilaterally. Venous sinuses: Patent Anatomic variants: None Review of the MIP images confirms the above findings IMPRESSION: 1. Atherosclerotic calcification carotid bifurcation without significant stenosis. Mild to moderate stenosis cavernous carotid bilaterally 2. Mild to moderate stenosis distal right vertebral artery. Left vertebral artery widely patent 3. No intracranial large vessel occlusion. Electronically Signed   By: CFranchot GalloM.D.   On: 11/11/2019 13:05   Ct Angio Neck W Or Wo Contrast  Result Date: 11/11/2019 CLINICAL DATA:  Slurred speech and weakness.  Rule out stroke EXAM: CT ANGIOGRAPHY HEAD AND NECK TECHNIQUE: Multidetector CT imaging of the head and neck was performed using the standard protocol during bolus administration of intravenous contrast. Multiplanar CT image reconstructions and MIPs were obtained to  evaluate the vascular anatomy. Carotid stenosis measurements (when applicable) are obtained utilizing NASCET criteria, using the distal internal carotid diameter as the denominator. CONTRAST:  161m OMNIPAQUE IOHEXOL 350 MG/ML SOLN COMPARISON:  CT head 11/11/2019 FINDINGS: CTA NECK FINDINGS Aortic arch: Atherosclerotic aortic arch. Aorta mass and mildly dilated. Ascending aorta 38 mm in diameter. Atherosclerotic disease in the proximal great vessels without significant stenosis. Right carotid system: Right common carotid artery widely patent. Atherosclerotic calcification right carotid bifurcation. Less than 25% diameter stenosis proximal right internal carotid artery. Left carotid system: Left common carotid artery widely patent. Mild atherosclerotic calcification left carotid bifurcation without  significant carotid stenosis. Vertebral arteries: Both vertebral arteries patent to the basilar. Mild-to-moderate stenosis distal right vertebral artery otherwise no vertebral stenosis. Skeleton: Marked kyphoscoliosis in the thoracic spine. No acute skeletal abnormality. Left occipital craniotomy and cranioplasty. Other neck: Negative for soft tissue mass. Upper chest: Apical scarring. Patchy airspace disease in the lung apices bilaterally which could be scarring or infection. 5 mm left upper lobe nodule Review of the MIP images confirms the above findings CTA HEAD FINDINGS Anterior circulation: Atherosclerotic calcification in the cavernous carotid bilaterally. Mild to moderate stenosis in the cavernous carotid bilaterally. Anterior and middle cerebral arteries patent bilaterally without significant stenosis. Posterior circulation: Mild to moderate stenosis distal right vertebral artery. Left vertebral patent. PICA patent bilaterally. Basilar widely patent. Superior cerebellar and posterior cerebral arteries patent bilaterally. Venous sinuses: Patent Anatomic variants: None Review of the MIP images confirms the above findings IMPRESSION: 1. Atherosclerotic calcification carotid bifurcation without significant stenosis. Mild to moderate stenosis cavernous carotid bilaterally 2. Mild to moderate stenosis distal right vertebral artery. Left vertebral artery widely patent 3. No intracranial large vessel occlusion. Electronically Signed   By: CFranchot GalloM.D.   On: 11/11/2019 13:05   Dg Chest Portable 1 View  Result Date: 11/11/2019 CLINICAL DATA:  Shortness of breath. EXAM: PORTABLE CHEST 1 VIEW COMPARISON:  Chest x-ray May 08, 2016. CT scan of the chest January 21, 2019 FINDINGS: Bilateral interstitial opacities are again identified and similar since the January 2020 CT scan. Prominence of the hila is likely due to pulmonary arterial hypertension given previous CT imaging. Stable cardiomegaly. No pneumothorax.  No other acute abnormalities are identified. IMPRESSION: 1. Diffuse slightly nodular ground-glass opacities in the lungs are similar compared to January 2020 but increased compared to 2017. 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 No overt edema today. 2. Enlarged hila likely due to pulmonary arterial hypertension given previous CT imaging. Electronically Signed   By: DDorise BullionIII M.D   On: 11/11/2019 13:15   UKoreaAbdomen Limited Ruq  Result Date: 11/13/2019 CLINICAL DATA:  Elevated LFTs. EXAM: ULTRASOUND ABDOMEN LIMITED RIGHT UPPER QUADRANT COMPARISON:  None. FINDINGS: Gallbladder: The gallbladder wall is upper limits of normal in thickness measuring 3 mm. Stone within the gallbladder is identified measuring 1.7 cm. No pericholecystic fluid. Negative sonographic Murphy's sign Common bile duct: Diameter: 2.3 mm Liver: No focal lesion identified. Within normal limits in parenchymal echogenicity. Portal vein is patent on color Doppler imaging with normal direction of blood flow towards the liver. Other: None. IMPRESSION: 1. Gallstone. Gallbladder wall thickness is upper limits of normal measuring 3 mm. If there is a high clinical concern for acute cholecystitis consider further evaluation with nuclear medicine hepatic biliary scan Electronically Signed   By: TKerby MoorsM.D.   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  She has been noted to have gallstones, but no evidence of biliary obstruction or cholecystitis.  Her abdominal US shows wall thickening of 4m but this is not really abnormal in the setting of no biliary symptoms or abdominal pain.  Given she has no pain, WBC, or significant  fever, we do think think she needs any further work up for her gallbladder.  Her previous symptoms last week were also not c/w biliary colic either.  No plans for surgical intervention.  She may eat as able and will defer further care to the primary service.  Please call as needed, otherwise we will sign off.   KHenreitta Cea PChangepoint Psychiatric HospitalSurgery 11/13/2019, 11:52 AM Please see Amion for pager number during day hours 7:00am-4:30pm

## 2019-11-14 LAB — COMPREHENSIVE METABOLIC PANEL
ALT: 216 U/L — ABNORMAL HIGH (ref 0–44)
AST: 218 U/L — ABNORMAL HIGH (ref 15–41)
Albumin: 2.3 g/dL — ABNORMAL LOW (ref 3.5–5.0)
Alkaline Phosphatase: 177 U/L — ABNORMAL HIGH (ref 38–126)
Anion gap: 11 (ref 5–15)
BUN: 18 mg/dL (ref 8–23)
CO2: 18 mmol/L — ABNORMAL LOW (ref 22–32)
Calcium: 6.2 mg/dL — CL (ref 8.9–10.3)
Chloride: 104 mmol/L (ref 98–111)
Creatinine, Ser: 1.38 mg/dL — ABNORMAL HIGH (ref 0.44–1.00)
GFR calc Af Amer: 45 mL/min — ABNORMAL LOW (ref >60)
GFR calc non Af Amer: 39 mL/min — ABNORMAL LOW (ref >60)
Glucose, Bld: 100 mg/dL — ABNORMAL HIGH (ref 70–99)
Potassium: 4 mmol/L (ref 3.5–5.1)
Sodium: 133 mmol/L — ABNORMAL LOW (ref 135–145)
Total Bilirubin: 0.6 mg/dL (ref 0.3–1.2)
Total Protein: 5.4 g/dL — ABNORMAL LOW (ref 6.5–8.1)

## 2019-11-14 LAB — CBC
HCT: 26.8 % — ABNORMAL LOW (ref 36.0–46.0)
Hemoglobin: 8.6 g/dL — ABNORMAL LOW (ref 12.0–15.0)
MCH: 33.6 pg (ref 26.0–34.0)
MCHC: 32.1 g/dL (ref 30.0–36.0)
MCV: 104.7 fL — ABNORMAL HIGH (ref 80.0–100.0)
Platelets: 226 10*3/uL (ref 150–400)
RBC: 2.56 MIL/uL — ABNORMAL LOW (ref 3.87–5.11)
RDW: 13.3 % (ref 11.5–15.5)
WBC: 6.9 10*3/uL (ref 4.0–10.5)
nRBC: 0 % (ref 0.0–0.2)

## 2019-11-14 MED ORDER — DIPHENOXYLATE-ATROPINE 2.5-0.025 MG PO TABS
1.0000 | ORAL_TABLET | Freq: Four times a day (QID) | ORAL | Status: DC | PRN
  Administered 2019-11-14: 1 via ORAL
  Filled 2019-11-14: qty 1

## 2019-11-14 NOTE — Plan of Care (Signed)
  Problem: Clinical Measurements: Goal: Ability to maintain clinical measurements within normal limits will improve Outcome: Progressing

## 2019-11-14 NOTE — Progress Notes (Signed)
PROGRESS NOTE    [name] [name]  ZYS:[id number] DOB: June 16, 1951 DOA: 11/10/2019 PCP: [name], MD  Brief Narrative: [name] [name] is a 68 y.o. 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   CT's of Head and Abdo were unremarkable.   Assessment & Plan:  1.   Acute metabolic encephalopathy  -CT head unremarkable -Likely secondary to dehydration, high-dose gabapentin use - improved with IV hydration and dose decrement of gabapentin. -decreased gabapentin to 300 mg nightly from 600 mg twice daily  -Gentle IV fluids  -Covid PCR is negative, no symptoms of UTI, follow-up urine culture -PT OT evaluation   2.  Severe hypomagnesemia and hypokalemia -Replete electrolytes   3.   Worsening LFTs, gallstones -Abdominal exam is benign however patient is immunocompromised with chronic steroid use -Right upper quadrant ultrasound noted with gallbladder wall thickening -General surgery consult- no clinical signs of acute cholecystitis/ biliary obstruction, and in the absence of abd pain, fever or leukocytosis no further work up for her gallbladder. -Follow clinically   4. Advanced scleroderma with peripheral arterial disease -Has chronic leg wounds, followed at the wound center, wounds without signs of infection at this time -Wound care consult requested -Continue low-dose chronic prednisone, followed by Dr. Amil Amen -On Plavix for PAD  5.  Severe pulmonary hypertension, chronic hypoxic respiratory failure -Due to COPD and pulmonary hypertension -Stable continue 2 L of O2 -Continue macitentan and sildenafil  -Also has interstitial lung  disease from scleroderma, followed by Dr. Lamonte Sakai, x-ray notes stable groundglass opacities -Hold diuretics 1 more day  5.  Hypothyroidism: On Synthroid replacement.  Continue.  6.  History of non-Hodgkin's lymphoma -Treated with chemotherapy, reportedly in remission  DVT prophylaxis: Lovenox subcu Code Status:  Discussed CODE STATUS with patient and spouse, she is DNR Family Communication:  Spouse at bedside Disposition Plan:  Home pending above work-up and improvement   Procedures:   Antimicrobials:    Subjective: Recurrence of episode of diarrhea - symptomatic support  Objective: Vitals:   11/13/19 0939 11/13/19 1823 11/13/19 2055 11/14/19 0456  BP: 118/61 (!) 113/57 112/60 (!) 118/58  Pulse: 96 91 90 87  Resp: (!) _0 Temp: 98.5 F (36.9 C) 98.9 F (37.2 C) 98.9 F (37.2 C) 98.3 F (36.8 C)  TempSrc: Oral Oral Oral Oral  SpO2: 92% 95% 95% 90%    Intake/Output Summary (Last 24 hours) at 11/14/2019 1309 Last data filed at 11/14/2019 0456 Gross per 24 hour  Intake 0 ml  Output 0 ml  Net 0 ml   There were no vitals filed for this visit.  Examination:  General exam: No acute distress Respiratory system: Basilar rales Cardiovascular system: S1 & S2 heard, RRR. Gastrointestinal system: Soft, mildly distended, no tenderness, bowel sounds present Central nervous system: Alert, responsive Extremities: No edema multiple digit amputations in fingers and toes chronic old healing ulcers in lower left lower leg on the foot and lower third of the leg Skin: As above Psychiatry: Appropriate mood and affect    Data Reviewed:   CBC: Recent Labs  Lab 11/08/19 1329 11/10/19 2104 11/12/19 0546 11/13/19 0526 11/14/19 0410  WBC 7.7 8.5 9.1 7.2 6.9  HGB 11.4* 11.0* 9.5* 9.9* 8.6*  HCT 36.5 35.2* 30.8* 30.6* 26.8*  MCV 106.1* 105.1* 106.9* 102.0* 104.7*  PLT 266 225 219 256 951   Basic Metabolic Panel: Recent Labs  Lab 11/10/19 2104 11/11/19 1203  11/11/19 2259 11/12/19 0546 11/13/19 0526 11/14/19 0410  NA 133*  --  139 138 137 133*  K 3.0*  --  4.0 4.0 5.0 4.0  CL 96*  --  104 105 106 104  CO2 24  --  18* 15* 18* 18*  GLUCOSE 120*  --  77 89 112* 100*  BUN 11  --  _0 CREATININE 0.98  --  0.98 1.18* 1.23* 1.38*  CALCIUM 6.7*  --  6.7* 6.3* 6.4* 6.2*  MG  --  0.6* 1.1* 0.9*  --   --   PHOS  --   --  3.0 3.0  --   --    GFR: Estimated Creatinine Clearance: 36.6 mL/min (A) (by C-G formula based on SCr of 1.38 mg/dL (H)). Liver Function Tests: Recent Labs  Lab 11/11/19 1203 11/11/19 2259 11/12/19 0546 11/13/19 0526 11/14/19 0410  AST 40 47* 100* 433* 218*  ALT 26 29 57* 302* 216*  ALKPHOS 78 109 173* 212* 177*  BILITOT 1.3* 1.1 1.2 1.2 0.6  PROT 6.8 6.6 5.9* 6.0* 5.4*  ALBUMIN 3.0* 2.8* 2.6* 2.5* 2.3*   Recent Labs  Lab 11/08/19 1329  LIPASE 109*   No results for input(s): AMMONIA in the last 168 hours. Coagulation Profile: No results for input(s): INR, PROTIME in the last 168 hours. Cardiac Enzymes: No results for input(s): CKTOTAL, CKMB, CKMBINDEX, TROPONINI in the last 168 hours. BNP (last 3 results) No results for input(s): PROBNP in the last 8760 hours. HbA1C: No results for input(s): HGBA1C in the last 72 hours. CBG: No results for input(s): GLUCAP in the last 168 hours. Lipid Profile: No results for input(s): CHOL, HDL, LDLCALC, TRIG, CHOLHDL, LDLDIRECT in the last 72 hours. Thyroid Function Tests: Recent Labs    11/11/19 1640  TSH 1.975   Anemia Panel: No results for input(s): VITAMINB12, FOLATE, FERRITIN, TIBC, IRON, RETICCTPCT in the last 72 hours. Urine analysis:    Component Value Date/Time   COLORURINE YELLOW 11/10/2019 2102   APPEARANCEUR HAZY (A) 11/10/2019 2102   LABSPEC 1.013 11/10/2019 2102   PHURINE 5.0 11/10/2019 2102   GLUCOSEU NEGATIVE 11/10/2019 2102   HGBUR SMALL (A) 11/10/2019 2102   BILIRUBINUR NEGATIVE 11/10/2019 2102   BILIRUBINUR NEG 12/10/2018 1211    KETONESUR NEGATIVE 11/10/2019 2102   PROTEINUR 100 (A) 11/10/2019 2102   UROBILINOGEN 0.2 12/10/2018 1211   NITRITE NEGATIVE 11/10/2019 2102   LEUKOCYTESUR MODERATE (A) 11/10/2019 2102   Sepsis Labs: _1 (procalcitonin:4,lacticidven:4)  ) Recent Results (from the past 240 hour(s))  Ova and parasite examination STOOL     Status: None   Collection Time: 11/10/19 12:43 PM   Specimen: Stool  Result Value Ref Range Status   MICRO NUMBER: [id number]  Final   SPECIMEN QUALITY: Adequate  Final   Source STOOL  Final   STATUS: FINAL  Final   CONCENTRATE RESULT: No ova or parasites seen  Final   TRICHROME RESULT: No ova or parasites seen  Final   COMMENT:   Final    Routine Ova and Parasite exam may not detect some parasites that occasionally cause diarrheal illness. Test code(s) 30160 (Cryptosporidium Ag., DFA) and/or 10018 (Cyclospora and Isospora Exam) may be ordered to detect these parasites. One negative sample  does not necessarily rule out  the presence of a parasitic infection.  For additional information, please refer to https://education.questdiagnostics.com/faq/FAQ203 (This link is being provided for informational/ educational purposes only.)   Stool culture     Status: None   Collection Time: 11/10/19 12:43 PM   Specimen: Stool  Result Value Ref Range Status   MICRO NUMBER: [id number]  Final   SPECIMEN QUALITY: Adequate  Final   SOURCE: STOOL  Final   STATUS: FINAL  Final   SHIGA RESULT: Not Detected  Final    Comment: Reference Range:Not Detected   MICRO NUMBER: [id number]  Final   SPECIMEN QUALITY: Adequate  Final   Source STOOL  Final   STATUS: FINAL  Final   CAM RESULT: No enteric Campylobacter isolated  Final   MICRO NUMBER: [id number]  Final   SPECIMEN QUALITY: Adequate  Final   SOURCE: STOOL  Final   STATUS: FINAL  Final   SS RESULT: No Salmonella or Shigella isolated  Final  SARS CORONAVIRUS 2 (TAT 6-24 HRS) Nasopharyngeal Nasopharyngeal Swab     Status: None    Collection Time: 11/11/19  1:35 PM   Specimen: Nasopharyngeal Swab  Result Value Ref Range Status   SARS Coronavirus 2 NEGATIVE NEGATIVE Final    Comment: (NOTE) SARS-CoV-2 target nucleic acids are NOT DETECTED. The SARS-CoV-2 RNA is generally detectable in upper and lower respiratory specimens during the acute phase of infection. Negative results do not preclude SARS-CoV-2 infection, do not rule out co-infections with other pathogens, and should not be used as the sole basis for treatment or other patient management decisions. Negative results must be combined with clinical observations, patient history, and epidemiological information. The expected result is Negative. Fact Sheet for Patients: SugarRoll.be Fact Sheet for Healthcare Providers: https://www.woods-mathews.com/ This test is not yet approved or cleared by the [place] FDA and  has been authorized for detection and/or diagnosis of SARS-CoV-2 by FDA under an Emergency Use Authorization (EUA). This EUA will remain  in effect (meaning this test can be used) for the duration of the COVID-19 declaration under Section 56 4(b)(1) of the Act, 21 U.S.C. section 360bbb-3(b)(1), unless the authorization is terminated or revoked sooner. Performed at [facility], [place] [address], [place], [place] [address]          Radiology Studies: US Abdomen Limited Ruq  Result Date: 11/13/2019 CLINICAL DATA:  Elevated LFTs. EXAM: ULTRASOUND ABDOMEN LIMITED RIGHT UPPER QUADRANT COMPARISON:  None. FINDINGS: Gallbladder: The gallbladder wall is upper limits of normal in thickness measuring 3 mm. Stone within the gallbladder is identified measuring 1.7 cm. No pericholecystic fluid. Negative sonographic Murphy's sign Common bile duct: Diameter: 2.3 mm Liver: No focal lesion identified. Within normal limits in parenchymal echogenicity. Portal vein is patent on color Doppler imaging with normal  direction of blood flow towards the liver. Other: None. IMPRESSION: 1. Gallstone. Gallbladder wall thickness is upper limits of normal measuring 3 mm. If there is a high clinical concern for acute cholecystitis consider further evaluation with nuclear medicine hepatic biliary scan Electronically Signed   By: Kerby Moors M.D.   On: 11/13/2019 09:41        Scheduled Meds: . citalopram  20 mg Oral Daily  . clopidogrel  75 mg Oral Daily  . cycloSPORINE  1 drop Both Eyes BID  . enoxaparin (LOVENOX) injection  40 mg Subcutaneous Q24H  . gabapentin  300 mg Oral QHS  . iron polysaccharides  150 mg Oral BID  . levothyroxine  50 mcg Oral QAC  breakfast  . macitentan  10 mg Oral Daily  . magnesium oxide  200 mg Oral BID  . NIFEdipine  30 mg Oral Daily  . pantoprazole  40 mg Oral BID  . predniSONE  5 mg Oral Daily  . saccharomyces boulardii  250 mg Oral BID  . sildenafil  60 mg Oral TID  . sodium chloride flush  3 mL Intravenous Once  . vitamin B-12  1,000 mcg Oral Daily   Continuous Infusions:    LOS: 2 days    Time spent: 60mn   GBenito Mccreedy MD [facility]  11/14/2019, 1:09 PM

## 2019-11-15 LAB — COMPREHENSIVE METABOLIC PANEL
ALT: 166 U/L — ABNORMAL HIGH (ref 0–44)
AST: 126 U/L — ABNORMAL HIGH (ref 15–41)
Albumin: 2.2 g/dL — ABNORMAL LOW (ref 3.5–5.0)
Alkaline Phosphatase: 162 U/L — ABNORMAL HIGH (ref 38–126)
Anion gap: 12 (ref 5–15)
BUN: 20 mg/dL (ref 8–23)
CO2: 19 mmol/L — ABNORMAL LOW (ref 22–32)
Calcium: 6.2 mg/dL — CL (ref 8.9–10.3)
Chloride: 105 mmol/L (ref 98–111)
Creatinine, Ser: 1.31 mg/dL — ABNORMAL HIGH (ref 0.44–1.00)
GFR calc Af Amer: 48 mL/min — ABNORMAL LOW (ref >60)
GFR calc non Af Amer: 41 mL/min — ABNORMAL LOW (ref >60)
Glucose, Bld: 91 mg/dL (ref 70–99)
Potassium: 4 mmol/L (ref 3.5–5.1)
Sodium: 136 mmol/L (ref 135–145)
Total Bilirubin: 0.6 mg/dL (ref 0.3–1.2)
Total Protein: 5.6 g/dL — ABNORMAL LOW (ref 6.5–8.1)

## 2019-11-15 MED ORDER — SACCHAROMYCES BOULARDII 250 MG PO CAPS: 250.0000 mg | ORAL_CAPSULE | Freq: Two times a day (BID) | ORAL | 0 refills | Status: DC

## 2019-11-15 MED ORDER — MAGNESIUM OXIDE 400 (241.3 MG) MG PO TABS: 200.0000 mg | ORAL_TABLET | Freq: Two times a day (BID) | ORAL | 0 refills | Status: DC

## 2019-11-15 MED ORDER — DIPHENOXYLATE-ATROPINE 2.5-0.025 MG PO TABS: 1.0000 | ORAL_TABLET | Freq: Four times a day (QID) | ORAL | 0 refills | Status: DC | PRN

## 2019-11-15 MED ORDER — GABAPENTIN 300 MG PO CAPS: 300.0000 mg | ORAL_CAPSULE | Freq: Every day | ORAL | 0 refills | Status: DC

## 2019-11-15 MED ORDER — DEXTROSE 5 % IV SOLN: Freq: Once | INTRAVENOUS | Status: DC

## 2019-11-15 NOTE — Plan of Care (Signed)
  Problem: Education: Goal: Knowledge of General Education information will improve Description: Including pain rating scale, medication(s)/side effects and non-pharmacologic comfort measures Outcome: Adequate for Discharge   Problem: Health Behavior/Discharge Planning: Goal: Ability to manage health-related needs will improve Outcome: Adequate for Discharge   Problem: Clinical Measurements: Goal: Ability to maintain clinical measurements within normal limits will improve Outcome: Adequate for Discharge Goal: Will remain free from infection Outcome: Adequate for Discharge Goal: Diagnostic test results will improve Outcome: Adequate for Discharge Goal: Respiratory complications will improve Outcome: Adequate for Discharge Goal: Cardiovascular complication will be avoided Outcome: Adequate for Discharge   Problem: Activity: Goal: Risk for activity intolerance will decrease 11/15/2019 1520 by [name], RN Outcome: Adequate for Discharge 11/15/2019 0819 by [name], RN Outcome: Progressing   Problem: Nutrition: Goal: Adequate nutrition will be maintained Outcome: Adequate for Discharge   Problem: Coping: Goal: Level of anxiety will decrease Outcome: Adequate for Discharge   Problem: Elimination: Goal: Will not experience complications related to bowel motility Outcome: Adequate for Discharge Goal: Will not experience complications related to urinary retention Outcome: Adequate for Discharge   Problem: Safety: Goal: Ability to remain free from injury will improve Outcome: Adequate for Discharge   Problem: Acute Rehab PT Goals(only PT should resolve) Goal: Pt Will Go Supine/Side To Sit Outcome: Adequate for Discharge Goal: Patient Will Transfer Sit To/From Stand Outcome: Adequate for Discharge Goal: Pt Will Transfer Bed To Chair/Chair To Bed Outcome: Adequate for Discharge Goal: Pt Will Ambulate Outcome: Adequate for Discharge Goal: Pt Will Go Up/Down  Stairs Outcome: Adequate for Discharge Goal: Pt/caregiver will Perform Home Exercise Program Outcome: Adequate for Discharge   Problem: Acute Rehab OT Goals (only OT should resolve) Goal: Pt. Will Perform Lower Body Dressing Outcome: Adequate for Discharge Goal: Pt. Will Transfer To Toilet Outcome: Adequate for Discharge Goal: Pt. Will Perform Toileting-Clothing Manipulation Outcome: Adequate for Discharge Goal: Pt. Will Perform Tub/Shower Transfer Outcome: Adequate for Discharge

## 2019-11-15 NOTE — Discharge Summary (Signed)
[name] [name], is a 68 y.o. female  DOB 1951-10-16  MRN [id number].  Admission date:  11/10/2019  Admitting Physician  Barb Merino, MD  Discharge Date:  11/15/2019   Primary MD  [name], MD  Recommendations for primary care physician for things to follow:  Chemistries, Magnesium and LFT's   Admission Diagnosis  Hypocalcemia [E83.51] Dehydration [E86.0] Hypokalemia [E87.6] Hypomagnesemia [W29.56] Acute metabolic encephalopathy [O13.08] Diarrhea, unspecified type [R19.7]   Discharge Diagnosis  Hypocalcemia [E83.51] Dehydration [E86.0] Hypokalemia [E87.6] Hypomagnesemia [M57.84] Acute metabolic encephalopathy [O96.29] Diarrhea, unspecified type [R19.7]    Principal Problem:   Altered mental status Active Problems:   Peripheral arterial occlusive disease ([place])   Essential hypertension   Hyperlipidemia   Pulmonary hypertension associated with systemic disorder ([place])   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[place])   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[facility])     Past Surgical History:  Procedure Laterality Date   AMPUTATION Right 10/24/2017   Procedure: RAY  resection right index;  Surgeon: Daryll Brod, MD;  Location: [place];  Service: Orthopedics;  Laterality: Right;  Axillary block   AMPUTATION Right 11/21/2018   Procedure: AMPUTATION RAY RIGHT MIDDLE FINGER;  Surgeon: Daryll Brod, MD;  Location: [place];  Service: Orthopedics;  Laterality: Right;   AMPUTATION Right 10/22/2019   Procedure: AMPUTATION DIGIT RIGHT RING AND SMALL FINGER, DIGITAL SYMPATHECTOMY RIGHT RADIAL ULNAR ARCH;  Surgeon: Daryll Brod, MD;  Location: [place];  Service: Orthopedics;  Laterality: Right;  AXILARY   apligraph  12-04-2102,   12-18-2012   bilateral calves   [name]   biopsy on cerebellum     COLONOSCOPY WITH PROPOFOL Left 10/07/2015   Procedure: COLONOSCOPY WITH PROPOFOL;  Surgeon: [name], MD;  Location: [place];  Service: Endoscopy;  Laterality: Left;   ENDARTERECTOMY FEMORAL Right 09/14/2016   Procedure: ENDARTERECTOMY FEMORAL RIGHT;  Surgeon: [name], MD;  Location[facility] OR;  Service: Vascular;  Laterality: Right;   ENDOVENOUS ABLATION SAPHENOUS VEIN W/ LASER  07-24-2012   right greater saphenous vein by [name], M.D.    ENDOVENOUS ABLATION SAPHENOUS VEIN W/ LASER  10-16-2012   left greater saphenous vein  by Dr. [name]   ENDOVENOUS ABLATION SAPHENOUS VEIN W/ LASER Right 09-01-2015   endovenous laser ablation right greater saphenous vein by [name] MD   ESOPHAGOGASTRODUODENOSCOPY N/A 05/07/2016   Procedure: ESOPHAGOGASTRODUODENOSCOPY (EGD);  Surgeon: [name], MD;  Location: [facility] ENDOSCOPY;  Service: Endoscopy;  Laterality: N/A;   ESOPHAGOGASTRODUODENOSCOPY (EGD) WITH PROPOFOL Left 10/07/2015   Procedure: ESOPHAGOGASTRODUODENOSCOPY (EGD) WITH PROPOFOL;  Surgeon: [name], MD;  Location: [place];  Service: Endoscopy;  Laterality: Left;   GIVENS CAPSULE STUDY N/A 07/31/2016   Procedure: GIVENS CAPSULE STUDY;  Surgeon: [name], MD;  Location: [place];  Service: Endoscopy;  Laterality:  N/A;   melonoma removes Right    behind right knee   PATCH ANGIOPLASTY Right 09/14/2016   Procedure: PATCH ANGIOPLASTY RIGHT FEMORAL ARTERY USING HEMASHIELD PLATINUM FINESSE PATCH;  Surgeon: [name], MD;  Location: [place];  Service: Vascular;  Laterality: Right;   PERIPHERAL VASCULAR CATHETERIZATION N/A 09/07/2016   Procedure: Abdominal Aortogram w/Lower Extremity;  Surgeon: [name], MD;  Location: [place] CV LAB;  Service: Cardiovascular;  Laterality: N/A;   RIGHT HEART CATH N/A 04/19/2017   Procedure: Right Heart Cath;  Surgeon: [name], MD;  Location: [place] CV LAB;  Service: Cardiovascular;  Laterality: N/A;   RIGHT HEART CATHETERIZATION N/A 11/14/2012   Procedure: RIGHT HEART CATH;  Surgeon: [name], MD;  [facility] C[facility] LAB;  Service: Cardiovascular;  Laterality: N/A;   rt little toe removal  10/1998   stent left thigh  3/11   TENDON TRANSFER Right 10/24/2017   Procedure: transfer extensor indecus propius/extensor digitorum commomus index to middle ring and small;  Surgeon: Daryll Brod, MD;  Location: [place];  Service: Orthopedics;  Laterality: Right;   TONSILLECTOMY     TONSILLECTOMY AND ADENOIDECTOMY     TUNNELED VENOUS CATHETER PLACEMENT  01/2013   ULNAR HEAD EXCISION Right 10/24/2017   Procedure: darrach right ulna;  Surgeon: Daryll Brod, MD;  Location: [place];  Service: Orthopedics;  Laterality: Right;   VEIN LIGATION AND STRIPPING  05/16/2012   Procedure: VEIN LIGATION AND STRIPPING;  Surgeon: [name], MD;  Location: [facility] OR;  Service: Vascular;  Laterality: Right;  Irrigation and Debridement right lower leg, ligation of saphenous vein.       HPI  from the history and physical done on the day of admission:  [name] [name] is a 68 y.o. 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  Patient is lethargic and unable to provide detailed history.  Husband is at the bedside. According to the patient's husband, patient had recently undergone right finger amputations due to her scleroderma and necrosis last month.  About 2 weeks ago and more severe about 1 week, she started feeling unwell, nauseous all the time and few episodes of vomiting, she has not been able to eat anything for at least last few days.  Patient had 2-3 loose bowel movements, mostly watery a day since last 1 week.  No fever or chills.  No abdominal pain.  Every day she would look more lethargic. Denies any recent travel.  Denies any sick exposure.  No recent  antibiotic use. ED Course: Patient afebrile in the ER.  Blood pressures are stable.  She is on 2 L oxygen and saturating 100% that she uses at home.  Potassium is 3, magnesium is 0.6, calcium 6.7.  Due to altered mental status and unable to talk, a stroke alert was called.  A CT head and CT angiogram of the neck and head is essentially normal.  She was in the emergency room 3 days ago, a CT scan of the abdomen pelvis was essentially normal. Patient was also seen and evaluated by her primary care physician yesterday, a C. difficile was negative.  Stool cultures are pending. Her diarrhea has improved.  She has no bowel movement for almost 24 hours after last stool test samples.  Extreme lethargic. Patient was given IV fluids, 10 mEq of potassium and 2 g of magnesium in the ER and admission requested. ER also consulted neurology, they recommended treatment for metabolic causes and stroke was ruled out.  Review of Systems: all systems are reviewed and pertinent positive as per HPI otherwise rest are negative.         Hospital Course:    Brief Narrative: [name] Alia  Blalockis a 68 y.o.femalewith medical history significant ofnon-Hodgkin's lymphoma of the brain status post chemotherapy, severe peripheral vascular disease due to scleroderma, with multiple digit amputations, hypertension, COPD, chronic pulmonary hypertension with hypoxia on 2 L oxygen at home, hypothyroidism, chronic leg ulcers presenting  with diarrhea, lethargy and electrolyte imbalance with elevated LFT's.   CT's of Head and Abdo were unremarkable.   Assessment & Plan:  1. Acute metabolic encephalopathy  -CT head unremarkable -Likely secondary to dehydration, high-dose gabapentin use - improved with IV hydration and dose decrement of gabapentin. -decreased gabapentin to 300 mg nightly from 600 mg twice daily  -Gentle IV fluids  -Covid PCR is negative, no symptoms of UTI, follow-up urine culture -PT OT evaluation requested but she wanted to go home so bad and felt at baseline and safe with her husband. -resolved at back to baseline at discharge  2.  Severe hypomagnesemia and hypokalemia -Repleted electrolytes   3.   Worsening LFTs, gallstones -Abdominal exam was benign however patient is immunocompromised with chronic steroid use -Right upper quadrant ultrasound noted with gallbladder wall thickening -General surgery consult- no clinical signs of acute cholecystitis/ biliary obstruction, and in the absence of abd pain, fever or leukocytosis no further work up for her gallbladder.  -Follow clinically as out-patient   4. Advanced scleroderma with peripheral arterial disease -Has chronic leg wounds, followed at the wound center, wounds without signs of infection at this time -Wound care consult requested -Continue low-dose chronic prednisone, followed by Dr. Amil Amen -On Plavix for PAD  5.  Severe pulmonary hypertension, chronic hypoxic respiratory failure -Due to COPD and pulmonary hypertension -Stable continue 2 L of O2 -Continue macitentan and sildenafil    -Also has interstitial lung disease from scleroderma, followed by Dr. Lamonte Sakai, x-ray notes stable groundglass opacities -Hold diuretics 1 more day  5.Hypothyroidism: On Synthroid replacement. Continue.  6.  History of non-Hodgkin's lymphoma -Treated with chemotherapy, reportedly in remission       Discharge Condition: improved, stable and satisfactory  Follow UP  Follow-up Information    [facility] Follow up.   Why: Someone from the [facility] will call to schedule visits Contact information: [address] [place], [place] [address] [phone]           Consults obtained - Gen Surgery  Diet and Activity recommendation:  As advised  Discharge Instructions      Discharge Instructions    Call MD for:  difficulty breathing, headache or visual disturbances   Complete by: As directed    Call MD for:  extreme fatigue   Complete by: As directed    Call MD for:  hives   Complete by: As directed    Call MD for:  persistant dizziness or light-headedness   Complete by: As directed    Call MD for:  persistant nausea and vomiting   Complete by: As directed    Call MD for:  redness, tenderness, or signs of infection (pain, swelling, redness, odor or green/yellow discharge around incision site)   Complete by: As directed    Call MD for:  severe uncontrolled pain   Complete by: As directed    Call MD for:  temperature >100.4   Complete by: As directed    Diet - low sodium heart healthy   Complete by: As directed    Increase activity slowly   Complete by: As directed         Discharge Medications     Allergies as of 11/15/2019      Reactions   Levofloxacin Other (See Comments)   FLU LIKE SYMPTOM Other reaction(s): Other (See Comments) FLU LIKE SYMPTOM   Sulfa Antibiotics Rash, Other (See Comments)   Face peeled Severe rash with significant peeling of face. No airway involvement per patient Face peeled   Levofloxacin In D5w  Other (See Comments)   FLU LIKE SYMPTOMS FLU LIKE SYMPTOMS   Doxycycline Hyclate Swelling   SWELLING REACTION UNSPECIFIED  "TABLETS ONLY - CAPSULES ARE TOLERATED FINE"   Penicillins Rash   Has patient had a PCN reaction causing immediate rash, facial/tongue/throat swelling, SOB or lightheadedness with hypotension: no Has patient had a PCN reaction causing severe rash involving mucus membranes or skin necrosis: no Has patient had a PCN reaction that required hospitalization: no Has patient had a PCN reaction occurring within the last 10 years: {no If all of the above answers are "NO", then may proceed with Cephalosporin use.      Medication List    STOP taking these medications   NIFEdipine 30 MG 24 hr tablet Commonly known as: ADALAT CC   potassium chloride 20 MEQ/15ML (10%) Soln   predniSONE 5 MG tablet Commonly known as: DELTASONE   sildenafil 20 MG tablet Commonly known as: REVATIO   spironolactone 25 MG tablet Commonly known as: ALDACTONE   torsemide 20 MG tablet Commonly known as: DEMADEX     TAKE these medications   atorvastatin 20 MG tablet Commonly known as: LIPITOR Take 1 tablet (20 mg total) by mouth daily.   Biotin 5000 MCG Tabs Take 5,000 mcg by mouth daily.   CALCIUM-MAGNESIUM-VITAMIN D PO Take 2 tablets by mouth daily.   citalopram 20 MG tablet Commonly known as: CELEXA TAKE 1 TABLET BY MOUTH EVERYDAY AT BEDTIME What changed: See the new instructions.   clopidogrel 75 MG tablet Commonly known as: PLAVIX TAKE 1 TABLET BY MOUTH EVERY DAY   diphenoxylate-atropine 2.5-0.025 MG tablet Commonly known as: LOMOTIL Take 1 tablet by mouth 4 (four) times daily as needed for diarrhea or loose stools.   Ferrex 150 150 MG capsule Generic drug: iron polysaccharides TAKE ONE CAPSULE BY MOUTH TWICE A DAY What changed: how much to take   fish oil-omega-3 fatty acids 1000 MG capsule Take 1,000 mg by mouth 2 (two) times daily.  gabapentin 300 MG  capsule Commonly known as: NEURONTIN Take 1 capsule (300 mg total) by mouth at bedtime. What changed:   how much to take  when to take this   HYDROcodone-acetaminophen 5-325 MG tablet Commonly known as: Norco Take 1 tablet by mouth every 6 (six) hours as needed for moderate pain.   levothyroxine 50 MCG tablet Commonly known as: SYNTHROID TAKE 1 TABLET BY MOUTH DAILY BEFORE BREAKFAST What changed: See the new instructions.   magnesium oxide 400 (241.3 Mg) MG tablet Commonly known as: MAG-OX Take 0.5 tablets (200 mg total) by mouth 2 (two) times daily.   multivitamin with minerals Tabs tablet Take 1 tablet by mouth daily.   ondansetron 4 MG disintegrating tablet Commonly known as: Zofran ODT Take 1 tablet (4 mg total) by mouth every 8 (eight) hours as needed for nausea or vomiting.   Opsumit 10 MG tablet Generic drug: macitentan Take 1 tablet (75m) by mouth daily.   OXYGEN Inhale 2-3 L into the lungs continuous.   pantoprazole 40 MG tablet Commonly known as: PROTONIX Take 1 tablet (40 mg total) by mouth 2 (two) times daily.   Probiotic Caps Take 1 capsule by mouth daily.   Restasis 0.05 % ophthalmic emulsion Generic drug: cycloSPORINE Place 1 drop into both eyes 2 (two) times daily.   saccharomyces boulardii 250 MG capsule Commonly known as: FLORASTOR Take 1 capsule (250 mg total) by mouth 2 (two) times daily.   Tylenol Arthritis Pain 650 MG CR tablet Generic drug: acetaminophen Take 1,300 mg by mouth every 8 (eight) hours.   valACYclovir 500 MG tablet Commonly known as: Valtrex Take 1 tablet (500 mg total) by mouth 2 (two) times daily. What changed:   when to take this  reasons to take this   vitamin B-12 1000 MCG tablet Commonly known as: CYANOCOBALAMIN Take 1,000 mcg by mouth daily.   Vitamin D 50 MCG (2000 UT) Caps Take 2,000 Units by mouth daily.       Major procedures and Radiology Reports - PLEASE review detailed and final reports for  all details, in brief -     Ct Angio Head W Or Wo Contrast  Result Date: 11/11/2019 CLINICAL DATA:  Slurred speech and weakness.  Rule out stroke EXAM: CT ANGIOGRAPHY HEAD AND NECK TECHNIQUE: Multidetector CT imaging of the head and neck was performed using the standard protocol during bolus administration of intravenous contrast. Multiplanar CT image reconstructions and MIPs were obtained to evaluate the vascular anatomy. Carotid stenosis measurements (when applicable) are obtained utilizing NASCET criteria, using the distal internal carotid diameter as the denominator. CONTRAST:  1078mOMNIPAQUE IOHEXOL 350 MG/ML SOLN COMPARISON:  CT head 11/11/2019 FINDINGS: CTA NECK FINDINGS Aortic arch: Atherosclerotic aortic arch. Aorta mass and mildly dilated. Ascending aorta 38 mm in diameter. Atherosclerotic disease in the proximal great vessels without significant stenosis. Right carotid system: Right common carotid artery widely patent. Atherosclerotic calcification right carotid bifurcation. Less than 25% diameter stenosis proximal right internal carotid artery. Left carotid system: Left common carotid artery widely patent. Mild atherosclerotic calcification left carotid bifurcation without significant carotid stenosis. Vertebral arteries: Both vertebral arteries patent to the basilar. Mild-to-moderate stenosis distal right vertebral artery otherwise no vertebral stenosis. Skeleton: Marked kyphoscoliosis in the thoracic spine. No acute skeletal abnormality. Left occipital craniotomy and cranioplasty. Other neck: Negative for soft tissue mass. Upper chest: Apical scarring. Patchy airspace disease in the lung apices bilaterally which could be scarring or infection. 5 mm left upper lobe nodule Review  of the MIP images confirms the above findings CTA HEAD FINDINGS Anterior circulation: Atherosclerotic calcification in the cavernous carotid bilaterally. Mild to moderate stenosis in the cavernous carotid bilaterally.  Anterior and middle cerebral arteries patent bilaterally without significant stenosis. Posterior circulation: Mild to moderate stenosis distal right vertebral artery. Left vertebral patent. PICA patent bilaterally. Basilar widely patent. Superior cerebellar and posterior cerebral arteries patent bilaterally. Venous sinuses: Patent Anatomic variants: None Review of the MIP images confirms the above findings IMPRESSION: 1. Atherosclerotic calcification carotid bifurcation without significant stenosis. Mild to moderate stenosis cavernous carotid bilaterally 2. Mild to moderate stenosis distal right vertebral artery. Left vertebral artery widely patent 3. No intracranial large vessel occlusion. Electronically Signed   By: Franchot Gallo M.D.   On: 11/11/2019 13:05   Ct Head Wo Contrast  Result Date: 11/11/2019 CLINICAL DATA:  Altered mental status. EXAM: CT HEAD WITHOUT CONTRAST TECHNIQUE: Contiguous axial images were obtained from the base of the skull through the vertex without intravenous contrast. COMPARISON:  Brain MRI 11/29/2017 FINDINGS: Brain: Ventricles, cisterns and other CSF spaces are within normal. There is mild chronic ischemic microvascular disease. Stable focus of encephalomalacia over the right cerebellar hemisphere. No mass, mass effect, shift of midline structures or acute hemorrhage. No evidence of acute infarction. Vascular: No hyperdense vessel or unexpected calcification. Skull: Previous right occipital craniotomy unchanged. Sinuses/Orbits: No acute finding. Other: None. IMPRESSION: 1.  No acute findings. 2. Mild chronic ischemic microvascular disease. Postsurgical change right cerebellar hemisphere with postsurgical change of the right occipital skull. Electronically Signed   By: Marin Olp M.D.   On: 11/11/2019 11:57   Ct Angio Neck W Or Wo Contrast  Result Date: 11/11/2019 CLINICAL DATA:  Slurred speech and weakness.  Rule out stroke EXAM: CT ANGIOGRAPHY HEAD AND NECK TECHNIQUE:  Multidetector CT imaging of the head and neck was performed using the standard protocol during bolus administration of intravenous contrast. Multiplanar CT image reconstructions and MIPs were obtained to evaluate the vascular anatomy. Carotid stenosis measurements (when applicable) are obtained utilizing NASCET criteria, using the distal internal carotid diameter as the denominator. CONTRAST:  13m OMNIPAQUE IOHEXOL 350 MG/ML SOLN COMPARISON:  CT head 11/11/2019 FINDINGS: CTA NECK FINDINGS Aortic arch: Atherosclerotic aortic arch. Aorta mass and mildly dilated. Ascending aorta 38 mm in diameter. Atherosclerotic disease in the proximal great vessels without significant stenosis. Right carotid system: Right common carotid artery widely patent. Atherosclerotic calcification right carotid bifurcation. Less than 25% diameter stenosis proximal right internal carotid artery. Left carotid system: Left common carotid artery widely patent. Mild atherosclerotic calcification left carotid bifurcation without significant carotid stenosis. Vertebral arteries: Both vertebral arteries patent to the basilar. Mild-to-moderate stenosis distal right vertebral artery otherwise no vertebral stenosis. Skeleton: Marked kyphoscoliosis in the thoracic spine. No acute skeletal abnormality. Left occipital craniotomy and cranioplasty. Other neck: Negative for soft tissue mass. Upper chest: Apical scarring. Patchy airspace disease in the lung apices bilaterally which could be scarring or infection. 5 mm left upper lobe nodule Review of the MIP images confirms the above findings CTA HEAD FINDINGS Anterior circulation: Atherosclerotic calcification in the cavernous carotid bilaterally. Mild to moderate stenosis in the cavernous carotid bilaterally. Anterior and middle cerebral arteries patent bilaterally without significant stenosis. Posterior circulation: Mild to moderate stenosis distal right vertebral artery. Left vertebral patent. PICA patent  bilaterally. Basilar widely patent. Superior cerebellar and posterior cerebral arteries patent bilaterally. Venous sinuses: Patent Anatomic variants: None Review of the MIP images confirms the above findings IMPRESSION: 1. Atherosclerotic calcification  carotid bifurcation without significant stenosis. Mild to moderate stenosis cavernous carotid bilaterally 2. Mild to moderate stenosis distal right vertebral artery. Left vertebral artery widely patent 3. No intracranial large vessel occlusion. Electronically Signed   By: Franchot Gallo M.D.   On: 11/11/2019 13:05   Ct Abdomen Pelvis W Contrast  Result Date: 11/08/2019 CLINICAL DATA:  Nausea/vomiting, scleroderma EXAM: CT ABDOMEN AND PELVIS WITH CONTRAST TECHNIQUE: Multidetector CT imaging of the abdomen and pelvis was performed using the standard protocol following bolus administration of intravenous contrast. CONTRAST:  74m OMNIPAQUE IOHEXOL 300 MG/ML  SOLN COMPARISON:  Partial comparison to CT chest dated 01/21/2019 FINDINGS: Lower chest: Faint ground-glass opacity with scattered small bilateral pulmonary nodules in the visualized lungs, measuring up to 7 mm in the right middle lobe, unchanged. Mildly dilated, fluid filled distal esophagus, likely related to the patient's known scleroderma. Atherosclerotic calcifications of the aortic root. Three vessel coronary atherosclerosis. Hepatobiliary: Liver is within normal limits. Cholelithiasis, without associated inflammatory changes. No intrahepatic or extrahepatic ductal dilatation. Pancreas: Within normal limits. Spleen: Within normal limits. Adrenals/Urinary Tract: Adrenal glands are within normal limits. Kidneys are within normal limits.  No hydronephrosis. Bladder is within normal limits. Stomach/Bowel: Stomach is within normal limits. No evidence of bowel obstruction. Appendix is not discretely visualized. Left colon is decompressed. Vascular/Lymphatic: No evidence of abdominal aortic aneurysm.  Atherosclerotic calcifications of the abdominal aorta and branch vessels. No suspicious abdominopelvic lymphadenopathy. Reproductive: Uterus is within normal limits. Bilateral ovaries are unremarkable. Other: Trace pelvic ascites. Musculoskeletal: Visualized osseous structures are within normal limits. IMPRESSION: No evidence of bowel obstruction. Cholelithiasis, without associated inflammatory changes. Sequela of scleroderma and associated chronic lung disease involving the lower chest, unchanged. Electronically Signed   By: SJulian HyM.D.   On: 11/08/2019 15:20   Dg Chest Portable 1 View  Result Date: 11/11/2019 CLINICAL DATA:  Shortness of breath. EXAM: PORTABLE CHEST 1 VIEW COMPARISON:  Chest x-ray May 08, 2016. CT scan of the chest January 21, 2019 FINDINGS: Bilateral interstitial opacities are again identified and similar since the January 2020 CT scan. Prominence of the hila is likely due to pulmonary arterial hypertension given previous CT imaging. Stable cardiomegaly. No pneumothorax. No other acute abnormalities are identified. IMPRESSION: 1. Diffuse slightly nodular ground-glass opacities in the lungs are similar compared to January 2020 but increased compared to 2017. 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 No overt edema today. 2. Enlarged hila likely due to pulmonary arterial hypertension given previous CT imaging. Electronically Signed   By: DDorise BullionIII M.D   On: 11/11/2019 13:15   Vas UKoreaLower Extremity Venous (dvt)  Result Date: 11/05/2019  Lower Venous Study Indications: Edema, and Pain.  Risk Factors: Venous insufficiency. Limitations: Edema, skin texture. Comparison Study: No prior study on file Performing Technologist: CSharion DoveRVS  Examination Guidelines: A complete evaluation includes B-mode imaging, spectral Doppler, color Doppler, and power Doppler as needed of all accessible portions  of each vessel. Bilateral testing is considered an integral part of a complete examination. Limited examinations for reoccurring indications may be performed as noted.  +-----+---------------+---------+-----------+----------+--------------+  RIGHT Compressibility Phasicity Spontaneity Properties Thrombus Aging  +-----+---------------+---------+-----------+----------+--------------+  CFV   Full            Yes       Yes                                    +-----+---------------+---------+-----------+----------+--------------+   +---------+---------------+---------+-----------+----------+--------------+  LEFT      Compressibility Phasicity Spontaneity Properties Thrombus Aging  +---------+---------------+---------+-----------+----------+--------------+  CFV       Full            Yes       Yes                                    +---------+---------------+---------+-----------+----------+--------------+  SFJ       Full                                                             +---------+---------------+---------+-----------+----------+--------------+  FV Prox   Full                                                             +---------+---------------+---------+-----------+----------+--------------+  FV Mid    Full                                                             +---------+---------------+---------+-----------+----------+--------------+  FV Distal Full                                                             +---------+---------------+---------+-----------+----------+--------------+  PFV       Full                                                             +---------+---------------+---------+-----------+----------+--------------+  POP       Full            Yes       Yes                                    +---------+---------------+---------+-----------+----------+--------------+  PTV       Full                                                              +---------+---------------+---------+-----------+----------+--------------+  PERO      Full                                                             +---------+---------------+---------+-----------+----------+--------------+  Summary: Right: No evidence of common femoral vein obstruction. Left: There is no evidence of deep vein thrombosis in the lower extremity.  *See table(s) above for measurements and observations. Electronically signed by Monica Martinez MD on 11/05/2019 at 3:40:49 PM.    Final    US Abdomen Limited Ruq  Result Date: 11/13/2019 CLINICAL DATA:  Elevated LFTs. EXAM: ULTRASOUND ABDOMEN LIMITED RIGHT UPPER QUADRANT COMPARISON:  None. FINDINGS: Gallbladder: The gallbladder wall is upper limits of normal in thickness measuring 3 mm. Stone within the gallbladder is identified measuring 1.7 cm. No pericholecystic fluid. Negative sonographic Murphy's sign Common bile duct: Diameter: 2.3 mm Liver: No focal lesion identified. Within normal limits in parenchymal echogenicity. Portal vein is patent on color Doppler imaging with normal direction of blood flow towards the liver. Other: None. IMPRESSION: 1. Gallstone. Gallbladder wall thickness is upper limits of normal measuring 3 mm. If there is a high clinical concern for acute cholecystitis consider further evaluation with nuclear medicine hepatic biliary scan Electronically Signed   By: Kerby Moors M.D.   On: 11/13/2019 09:41    Micro Results     Recent Results (from the past 240 hour(s))  Ova and parasite examination STOOL     Status: None   Collection Time: 11/10/19 12:43 PM   Specimen: Stool  Result Value Ref Range Status   MICRO NUMBER: [id number]  Final   SPECIMEN QUALITY: Adequate  Final   Source STOOL  Final   STATUS: FINAL  Final   CONCENTRATE RESULT: No ova or parasites seen  Final   TRICHROME RESULT: No ova or parasites seen  Final   COMMENT:   Final    Routine Ova and Parasite exam may not detect some parasites  that occasionally cause diarrheal illness. Test code(s) 85885 (Cryptosporidium Ag., DFA) and/or 10018 (Cyclospora and Isospora Exam) may be ordered to detect these parasites. One negative sample  does not necessarily rule out the presence of a parasitic infection.  For additional information, please refer to https://education.questdiagnostics.com/faq/FAQ203 (This link is being provided for informational/ educational purposes only.)   Stool culture     Status: None   Collection Time: 11/10/19 12:43 PM   Specimen: Stool  Result Value Ref Range Status   MICRO NUMBER: [id number]  Final   SPECIMEN QUALITY: Adequate  Final   SOURCE: STOOL  Final   STATUS: FINAL  Final   SHIGA RESULT: Not Detected  Final    Comment: Reference Range:Not Detected   MICRO NUMBER: [id number]  Final   SPECIMEN QUALITY: Adequate  Final   Source STOOL  Final   STATUS: FINAL  Final   CAM RESULT: No enteric Campylobacter isolated  Final   MICRO NUMBER: [id number]  Final   SPECIMEN QUALITY: Adequate  Final   SOURCE: STOOL  Final   STATUS: FINAL  Final   SS RESULT: No Salmonella or Shigella isolated  Final  SARS CORONAVIRUS 2 (TAT 6-24 HRS) Nasopharyngeal Nasopharyngeal Swab     Status: None   Collection Time: 11/11/19  1:35 PM   Specimen: Nasopharyngeal Swab  Result Value Ref Range Status   SARS Coronavirus 2 NEGATIVE NEGATIVE Final    Comment: (NOTE) SARS-CoV-2 target nucleic acids are NOT DETECTED. The SARS-CoV-2 RNA is generally detectable in upper and lower respiratory specimens during the acute phase of infection. Negative results do not preclude SARS-CoV-2 infection, do not rule out co-infections with other pathogens, and should not be used as the sole basis for treatment or other patient management decisions.  Negative results must be combined with clinical observations, patient history, and epidemiological information. The expected result is Negative. Fact Sheet for  Patients: SugarRoll.be Fact Sheet for Healthcare Providers: https://www.woods-mathews.com/ This test is not yet approved or cleared by the [place] FDA and  has been authorized for detection and/or diagnosis of SARS-CoV-2 by FDA under an Emergency Use Authorization (EUA). This EUA will remain  in effect (meaning this test can be used) for the duration of the COVID-19 declaration under Section 56 4(b)(1) of the Act, 21 U.S.C. section 360bbb-3(b)(1), unless the authorization is terminated or revoked sooner. Performed at [facility], [place] [address] [place], [place] [address]        Today   Subjective    [name] [name] today has no dizziness, she is alert and back to baseline MS, and husband agrees. She has been pushing to go home, pending PT/OT eval/recc. But insists she is fine to go home, and husband agrees   Patient has been seen and examined prior to discharge   Objective   Blood pressure 115/66, pulse 74, temperature 98.2 F (36.8 C), temperature source Oral, resp. rate 17, weight 63.5 kg, SpO2 96 %.   Intake/Output Summary (Last 24 hours) at 11/15/2019 1444 Last data filed at 11/15/2019 1050 Gross per 24 hour  Intake 1140 ml  Output 250 ml  Net 890 ml    Exam  General exam: No acute distress Respiratory system: Basilar rales Cardiovascular system: S1 & S2 heard, RRR. 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 chronic old healing ulcers in lower left lower leg on the foot and lower third of the leg Psychiatry: Appropriate mood and affect  Data Review   CBC w Diff:  Lab Results  Component Value Date   WBC 6.9 11/14/2019   HGB 8.6 (L) 11/14/2019   HCT 26.8 (L) 11/14/2019   PLT 226 11/14/2019   LYMPHOPCT 5 07/29/2016   MONOPCT 8.8 09/02/2019   EOSPCT 0.0  09/02/2019   BASOPCT 0.3 09/02/2019    CMP:  Lab Results  Component Value Date   NA 136 11/15/2019   K 4.0 11/15/2019   CL 105 11/15/2019   CO2 19 (L) 11/15/2019   BUN 20 11/15/2019   CREATININE 1.31 (H) 11/15/2019   CREATININE 1.36 (H) 10/27/2019   PROT 5.6 (L) 11/15/2019   ALBUMIN 2.2 (L) 11/15/2019   BILITOT 0.6 11/15/2019   ALKPHOS 162 (H) 11/15/2019   AST 126 (H) 11/15/2019   ALT 166 (H) 11/15/2019  .   Total Discharge time is about 33 minutes  Benito Mccreedy M.D on 11/15/2019 at 2:44 PM  [facility]   Office  [phone]  Dragon dictation system was used to create this note, attempts have been made to correct errors, however presence of uncorrected errors is not a reflection quality of care provided

## 2019-11-15 NOTE — Progress Notes (Signed)
[name] [name] to be discharged home per MD order. Discussed prescriptions and follow up appointments with the patient. Prescriptions given to patient; medication list explained in detail. Patient verbalized understanding.  Skin clean, dry and intact without evidence of skin break down, no evidence of skin tears noted. IV catheter discontinued intact. Site without signs and symptoms of complications. Dressing and pressure applied. Pt denies pain at the site currently. No complaints noted.  Patient free of lines, drains, and wounds.   An After Visit Summary (AVS) was printed and given to the patient. Patient escorted via wheelchair, and discharged home via private auto.  [name], RN

## 2019-11-15 NOTE — Plan of Care (Signed)
  Problem: Activity: Goal: Risk for activity intolerance will decrease Outcome: Progressing

## 2019-11-17 ENCOUNTER — Telehealth: Payer: Self-pay

## 2019-11-17 ENCOUNTER — Telehealth: Payer: Self-pay | Admitting: Internal Medicine

## 2019-11-17 NOTE — Telephone Encounter (Signed)
Araceli needs to do med rec today and have her come in tomorrow for hospital follow up

## 2019-11-17 NOTE — Telephone Encounter (Signed)
Called patient to go over medications, after being in the hospital she is feeling okay, she is taking her medications, she is staying home her husband is assisting her. She said the only problem she has is her mouth "feels like burning sensation". Scheduled hospital follow up tomorrow 11/18.

## 2019-11-17 NOTE — Telephone Encounter (Signed)
Kaydon Creedon [phone]  [name] [name] called to say that [name] [name]'s lips are very chapped, they are putting blistex on them but it is not helping, also her mouth burns when she from food when she eats. However she is much better. Would like to know what to do about lips and mouth.

## 2019-11-18 ENCOUNTER — Ambulatory Visit (INDEPENDENT_AMBULATORY_CARE_PROVIDER_SITE_OTHER): Payer: Medicare Other | Admitting: Internal Medicine

## 2019-11-18 ENCOUNTER — Telehealth: Payer: Self-pay | Admitting: Internal Medicine

## 2019-11-18 ENCOUNTER — Encounter: Payer: Self-pay | Admitting: Internal Medicine

## 2019-11-18 ENCOUNTER — Telehealth: Payer: Self-pay

## 2019-11-18 ENCOUNTER — Other Ambulatory Visit: Payer: Self-pay

## 2019-11-18 VITALS — BP 110/50 | HR 60 | Temp 97.8°F | Ht 67.5 in | Wt 137.0 lb

## 2019-11-18 DIAGNOSIS — Z8572 Personal history of non-Hodgkin lymphomas: Secondary | ICD-10-CM | POA: Diagnosis not present

## 2019-11-18 DIAGNOSIS — E039 Hypothyroidism, unspecified: Secondary | ICD-10-CM | POA: Diagnosis not present

## 2019-11-18 DIAGNOSIS — R197 Diarrhea, unspecified: Secondary | ICD-10-CM | POA: Diagnosis not present

## 2019-11-18 DIAGNOSIS — R748 Abnormal levels of other serum enzymes: Secondary | ICD-10-CM

## 2019-11-18 DIAGNOSIS — R55 Syncope and collapse: Secondary | ICD-10-CM | POA: Diagnosis not present

## 2019-11-18 DIAGNOSIS — M349 Systemic sclerosis, unspecified: Secondary | ICD-10-CM | POA: Diagnosis not present

## 2019-11-18 DIAGNOSIS — K8020 Calculus of gallbladder without cholecystitis without obstruction: Secondary | ICD-10-CM

## 2019-11-18 DIAGNOSIS — D649 Anemia, unspecified: Secondary | ICD-10-CM

## 2019-11-18 DIAGNOSIS — K121 Other forms of stomatitis: Secondary | ICD-10-CM

## 2019-11-18 DIAGNOSIS — R790 Abnormal level of blood mineral: Secondary | ICD-10-CM | POA: Diagnosis not present

## 2019-11-18 DIAGNOSIS — I2729 Other secondary pulmonary hypertension: Secondary | ICD-10-CM | POA: Diagnosis not present

## 2019-11-18 DIAGNOSIS — Z9981 Dependence on supplemental oxygen: Secondary | ICD-10-CM | POA: Diagnosis not present

## 2019-11-18 DIAGNOSIS — R54 Age-related physical debility: Secondary | ICD-10-CM

## 2019-11-18 DIAGNOSIS — I10 Essential (primary) hypertension: Secondary | ICD-10-CM | POA: Diagnosis not present

## 2019-11-18 DIAGNOSIS — E869 Volume depletion, unspecified: Secondary | ICD-10-CM

## 2019-11-18 MED ORDER — NYSTATIN 100000 UNIT/ML MT SUSP: 5.0000 mL | Freq: Four times a day (QID) | OROMUCOSAL | 0 refills | Status: DC

## 2019-11-18 NOTE — Progress Notes (Signed)
Received call from [facility] liaison that patient discharged on Sunday but did not have [place] orders. Telephone to PCP office to advise of [place] needs. PCP office advised that patient was on her way to an appointment with PCP d/t having issues. Noted that DC summary not complete and PCP needing to know about recent hospitalization. Discharging MD notified through hospitalist office and advised of situation. Discharging MD to follow up with PCP.   Manya Silvas, RN MSN CCM Transitions of Care 27M/58M [phone]

## 2019-11-18 NOTE — Telephone Encounter (Signed)
[place] called to say that [name] [name] had been discharged from the hospital over the weekend and that they had contacted [place] to go out to the home. I let her know that we have not received the discharge sumary from the hospital, so we are not aware of what the expectations are at this time. Abigail Butts is going to work on getting Discharge Summary signed and sent to Dr Renold Genta, so that when the patient comes in today Dr Renold Genta can discuss with patient and then sign off on [place].

## 2019-11-18 NOTE — Patient Instructions (Signed)
See if diarrhea will resolve on its own without use of Lomotil.  Nystatin oral solution to use 4 times daily swish do not swallow for stomatitis.  Labs drawn and pending.

## 2019-11-18 NOTE — Progress Notes (Signed)
Subjective:    [name], female    DOB: November 21, 1951, 68 y.o.   MRN: [id number]  HPI 68 year old Female for hospital follow up.  She has a history of scleroderma, hypertension, hyperlipidemia depression, severe venous stasis ulceration right leg with arterial insufficiency.  History of chronic kidney disease stage III.  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  She has pulmonary hypertension and is on chronic oxygen therapy.   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  She underwent digital sympathectomy right radial ulnar arch by Dr. Daryll Brod.  She underwent wound debridement right medial lower leg by Dr. Dellia Nims on November 2.  She was rechecked by Dr. Fredna Dow November 6.  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  No evidence of infection at that time.  On November 8th, she developed nausea and vomiting and was seen at [facility].  She had a  elevated lipase of 109.  Had CT of the abdomen and pelvis with contrast.  This showed cholelithiasis without associated inflammatory changes.  Potassium was 3.1.  Bicarbonate was 21.  She was given Zofran and discharged home.  Subsequently developed diarrhea after being on doxycycline.  Stool studies were arranged.  Stool studies positive only for lactoferrin.  Subsequently hospitalized November 10 through November 15 after having syncopal episode at home.  Was diagnosed with volume depletion, hypocalcemia, hypokalemia, hypomagnesemia and acute metabolic encephalopathy.  Diarrhea had persisted.  Magnesium level was 0.6.  She was found to be in sinus rhythm.  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  Had atherosclerotic calcification of the carotid bifurcation without  significant stenosis.  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  Was seen by neurology.  EEG was negative.  Gabapentin was decreased from 600 mg twice daily to 300 mg at bedtime.  She is now here with her husband.  She is ambulatory.  PT and OT evaluation have been requested at discharge but patient is getting along fairly well at home and her husband is able to help her so we are going to decline no services.  We are following up today on low magnesium and hypokalemia.  Lab studies drawn and pending.  She is alert and oriented and able to give a clear concise history.   Review of Systems no new complaints today she had episode of diarrhea this morning but in general diarrhea has subsided     Objective:   Physical Exam Blood pressure 110/50 pulse 60 temperature 97.8 pulse oximetry 92% BMI 21.14 Skin warm and dry.  Right hand healing well without evidence of secondary infection.  Neck is supple.  No carotid bruits.  Chest clear to auscultation.  Cardiac exam regular rate and rhythm normal S1 and S2.  No lower extremity edema.  Her affect thought judgment processes are normal.  She looks frail.      Assessment & Plan:  Status post recent hospitalization for syncope.  Stroke ruled out.  Patient had severe hypomagnesemia which was treated.  This will be followed up today along with electrolytes and CBC.  History of scleroderma status recent right digit surgery by Dr. Fredna Dow and healing well.  Pulmonary hypertension and oxygen  dependency  Frailty  Diarrhea-resolving try to avoid Lomotil and see if diarrhea will run its course  Stomatitis-prescribed Nystatin oral solution swish but do not swallow 4 times a day per her request.  Time spent with patient and her husband 45 minutes

## 2019-11-19 LAB — COMPLETE METABOLIC PANEL WITH GFR
AG Ratio: 1.3 (calc) (ref 1.0–2.5)
ALT: 73 U/L — ABNORMAL HIGH (ref 6–29)
AST: 32 U/L (ref 10–35)
Albumin: 3.3 g/dL — ABNORMAL LOW (ref 3.6–5.1)
Alkaline phosphatase (APISO): 160 U/L — ABNORMAL HIGH (ref 37–153)
BUN/Creatinine Ratio: 14 (calc) (ref 6–22)
BUN: 17 mg/dL (ref 7–25)
CO2: 25 mmol/L (ref 20–32)
Calcium: 7.1 mg/dL — ABNORMAL LOW (ref 8.6–10.4)
Chloride: 100 mmol/L (ref 98–110)
Creat: 1.24 mg/dL — ABNORMAL HIGH (ref 0.60–0.93)
GFR, Est African American: 51 mL/min/{1.73_m2} — ABNORMAL LOW (ref >=60)
GFR, Est Non African American: 44 mL/min/{1.73_m2} — ABNORMAL LOW (ref >=60)
Globulin: 2.6 g/dL (calc) (ref 1.9–3.7)
Glucose, Bld: 87 mg/dL (ref 65–99)
Potassium: 3.9 mmol/L (ref 3.5–5.3)
Sodium: 138 mmol/L (ref 135–146)
Total Bilirubin: 0.4 mg/dL (ref 0.2–1.2)
Total Protein: 5.9 g/dL — ABNORMAL LOW (ref 6.1–8.1)

## 2019-11-19 LAB — CBC WITH DIFFERENTIAL/PLATELET
Absolute Monocytes: 644 cells/uL (ref 200–950)
Basophils Absolute: 22 cells/uL (ref 0–200)
Basophils Relative: 0.3 %
Eosinophils Absolute: 59 cells/uL (ref 15–500)
Eosinophils Relative: 0.8 %
HCT: 29.9 % — ABNORMAL LOW (ref 35.0–45.0)
Hemoglobin: 9.6 g/dL — ABNORMAL LOW (ref 11.7–15.5)
Lymphs Abs: 451 cells/uL — ABNORMAL LOW (ref 850–3900)
MCH: 32.1 pg (ref 27.0–33.0)
MCHC: 32.1 g/dL (ref 32.0–36.0)
MCV: 100 fL (ref 80.0–100.0)
MPV: 10.1 fL (ref 7.5–12.5)
Monocytes Relative: 8.7 %
Neutro Abs: 6223 cells/uL (ref 1500–7800)
Neutrophils Relative %: 84.1 %
Platelets: 331 10*3/uL (ref 140–400)
RBC: 2.99 10*6/uL — ABNORMAL LOW (ref 3.80–5.10)
RDW: 12.4 % (ref 11.0–15.0)
Total Lymphocyte: 6.1 %
WBC: 7.4 10*3/uL (ref 3.8–10.8)

## 2019-11-19 LAB — MAGNESIUM: Magnesium: 0.9 mg/dL — CL (ref 1.5–2.5)

## 2019-11-21 ENCOUNTER — Other Ambulatory Visit: Payer: Self-pay | Admitting: Internal Medicine

## 2019-11-23 ENCOUNTER — Encounter (HOSPITAL_COMMUNITY): Payer: Self-pay

## 2019-11-23 ENCOUNTER — Ambulatory Visit: Payer: Self-pay | Admitting: Surgical

## 2019-11-23 ENCOUNTER — Emergency Department (HOSPITAL_COMMUNITY): Payer: Medicare Other

## 2019-11-23 ENCOUNTER — Inpatient Hospital Stay (HOSPITAL_COMMUNITY)
Admission: EM | Admit: 2019-11-23 | Discharge: 2019-11-26 | DRG: 511 | Disposition: A | Discharge status: Home Health | Payer: Medicare Other | Attending: Student | Admitting: Student

## 2019-11-23 ENCOUNTER — Encounter (HOSPITAL_BASED_OUTPATIENT_CLINIC_OR_DEPARTMENT_OTHER): Payer: Medicare Other | Admitting: Physician Assistant

## 2019-11-23 ENCOUNTER — Other Ambulatory Visit: Payer: Self-pay

## 2019-11-23 DIAGNOSIS — I779 Disorder of arteries and arterioles, unspecified: Secondary | ICD-10-CM | POA: Diagnosis not present

## 2019-11-23 DIAGNOSIS — F32A Depression, unspecified: Secondary | ICD-10-CM | POA: Diagnosis present

## 2019-11-23 DIAGNOSIS — J9611 Chronic respiratory failure with hypoxia: Secondary | ICD-10-CM | POA: Diagnosis not present

## 2019-11-23 DIAGNOSIS — Z79899 Other long term (current) drug therapy: Secondary | ICD-10-CM

## 2019-11-23 DIAGNOSIS — S52509A Unspecified fracture of the lower end of unspecified radius, initial encounter for closed fracture: Secondary | ICD-10-CM | POA: Diagnosis not present

## 2019-11-23 DIAGNOSIS — Z8349 Family history of other endocrine, nutritional and metabolic diseases: Secondary | ICD-10-CM

## 2019-11-23 DIAGNOSIS — S32592A Other specified fracture of left pubis, initial encounter for closed fracture: Secondary | ICD-10-CM

## 2019-11-23 DIAGNOSIS — Z880 Allergy status to penicillin: Secondary | ICD-10-CM

## 2019-11-23 DIAGNOSIS — I2729 Other secondary pulmonary hypertension: Secondary | ICD-10-CM | POA: Diagnosis present

## 2019-11-23 DIAGNOSIS — I87311 Chronic venous hypertension (idiopathic) with ulcer of right lower extremity: Secondary | ICD-10-CM | POA: Diagnosis not present

## 2019-11-23 DIAGNOSIS — Z7902 Long term (current) use of antithrombotics/antiplatelets: Secondary | ICD-10-CM

## 2019-11-23 DIAGNOSIS — S52502A Unspecified fracture of the lower end of left radius, initial encounter for closed fracture: Secondary | ICD-10-CM | POA: Diagnosis not present

## 2019-11-23 DIAGNOSIS — S32810A Multiple fractures of pelvis with stable disruption of pelvic ring, initial encounter for closed fracture: Secondary | ICD-10-CM | POA: Diagnosis not present

## 2019-11-23 DIAGNOSIS — G8389 Other specified paralytic syndromes: Secondary | ICD-10-CM | POA: Diagnosis present

## 2019-11-23 DIAGNOSIS — Z85820 Personal history of malignant melanoma of skin: Secondary | ICD-10-CM

## 2019-11-23 DIAGNOSIS — E871 Hypo-osmolality and hyponatremia: Secondary | ICD-10-CM | POA: Diagnosis present

## 2019-11-23 DIAGNOSIS — Z9981 Dependence on supplemental oxygen: Secondary | ICD-10-CM

## 2019-11-23 DIAGNOSIS — R9431 Abnormal electrocardiogram [ECG] [EKG]: Secondary | ICD-10-CM

## 2019-11-23 DIAGNOSIS — R42 Dizziness and giddiness: Secondary | ICD-10-CM | POA: Diagnosis not present

## 2019-11-23 DIAGNOSIS — E8889 Other specified metabolic disorders: Secondary | ICD-10-CM | POA: Diagnosis present

## 2019-11-23 DIAGNOSIS — Z79890 Hormone replacement therapy (postmenopausal): Secondary | ICD-10-CM

## 2019-11-23 DIAGNOSIS — I959 Hypotension, unspecified: Secondary | ICD-10-CM | POA: Diagnosis not present

## 2019-11-23 DIAGNOSIS — I10 Essential (primary) hypertension: Secondary | ICD-10-CM | POA: Diagnosis present

## 2019-11-23 DIAGNOSIS — S62109A Fracture of unspecified carpal bone, unspecified wrist, initial encounter for closed fracture: Secondary | ICD-10-CM | POA: Diagnosis present

## 2019-11-23 DIAGNOSIS — N1831 Chronic kidney disease, stage 3a: Secondary | ICD-10-CM | POA: Diagnosis present

## 2019-11-23 DIAGNOSIS — E876 Hypokalemia: Secondary | ICD-10-CM | POA: Diagnosis not present

## 2019-11-23 DIAGNOSIS — D62 Acute posthemorrhagic anemia: Secondary | ICD-10-CM | POA: Diagnosis not present

## 2019-11-23 DIAGNOSIS — T148XXA Other injury of unspecified body region, initial encounter: Secondary | ICD-10-CM

## 2019-11-23 DIAGNOSIS — M349 Systemic sclerosis, unspecified: Secondary | ICD-10-CM | POA: Diagnosis present

## 2019-11-23 DIAGNOSIS — S52592A Other fractures of lower end of left radius, initial encounter for closed fracture: Secondary | ICD-10-CM | POA: Diagnosis not present

## 2019-11-23 DIAGNOSIS — Z832 Family history of diseases of the blood and blood-forming organs and certain disorders involving the immune mechanism: Secondary | ICD-10-CM

## 2019-11-23 DIAGNOSIS — E785 Hyperlipidemia, unspecified: Secondary | ICD-10-CM | POA: Diagnosis present

## 2019-11-23 DIAGNOSIS — Z03818 Encounter for observation for suspected exposure to other biological agents ruled out: Secondary | ICD-10-CM | POA: Diagnosis not present

## 2019-11-23 DIAGNOSIS — L97509 Non-pressure chronic ulcer of other part of unspecified foot with unspecified severity: Secondary | ICD-10-CM | POA: Diagnosis present

## 2019-11-23 DIAGNOSIS — Z8572 Personal history of non-Hodgkin lymphomas: Secondary | ICD-10-CM

## 2019-11-23 DIAGNOSIS — I739 Peripheral vascular disease, unspecified: Secondary | ICD-10-CM | POA: Diagnosis present

## 2019-11-23 DIAGNOSIS — W19XXXA Unspecified fall, initial encounter: Secondary | ICD-10-CM | POA: Diagnosis not present

## 2019-11-23 DIAGNOSIS — Z9221 Personal history of antineoplastic chemotherapy: Secondary | ICD-10-CM

## 2019-11-23 DIAGNOSIS — Z807 Family history of other malignant neoplasms of lymphoid, hematopoietic and related tissues: Secondary | ICD-10-CM

## 2019-11-23 DIAGNOSIS — I97411 Intraoperative hemorrhage and hematoma of a circulatory system organ or structure complicating a cardiac bypass: Secondary | ICD-10-CM

## 2019-11-23 DIAGNOSIS — M810 Age-related osteoporosis without current pathological fracture: Secondary | ICD-10-CM | POA: Diagnosis present

## 2019-11-23 DIAGNOSIS — R4020 Unspecified coma: Secondary | ICD-10-CM | POA: Diagnosis not present

## 2019-11-23 DIAGNOSIS — F329 Major depressive disorder, single episode, unspecified: Secondary | ICD-10-CM | POA: Diagnosis not present

## 2019-11-23 DIAGNOSIS — Y92009 Unspecified place in unspecified non-institutional (private) residence as the place of occurrence of the external cause: Secondary | ICD-10-CM

## 2019-11-23 DIAGNOSIS — R0902 Hypoxemia: Secondary | ICD-10-CM | POA: Diagnosis not present

## 2019-11-23 DIAGNOSIS — E039 Hypothyroidism, unspecified: Secondary | ICD-10-CM | POA: Diagnosis present

## 2019-11-23 DIAGNOSIS — I129 Hypertensive chronic kidney disease with stage 1 through stage 4 chronic kidney disease, or unspecified chronic kidney disease: Secondary | ICD-10-CM | POA: Diagnosis present

## 2019-11-23 DIAGNOSIS — E860 Dehydration: Secondary | ICD-10-CM | POA: Diagnosis not present

## 2019-11-23 DIAGNOSIS — I2721 Secondary pulmonary arterial hypertension: Secondary | ICD-10-CM | POA: Diagnosis present

## 2019-11-23 DIAGNOSIS — D638 Anemia in other chronic diseases classified elsewhere: Secondary | ICD-10-CM | POA: Diagnosis present

## 2019-11-23 DIAGNOSIS — Z888 Allergy status to other drugs, medicaments and biological substances status: Secondary | ICD-10-CM

## 2019-11-23 DIAGNOSIS — L97312 Non-pressure chronic ulcer of right ankle with fat layer exposed: Secondary | ICD-10-CM | POA: Diagnosis not present

## 2019-11-23 DIAGNOSIS — S32599A Other specified fracture of unspecified pubis, initial encounter for closed fracture: Secondary | ICD-10-CM | POA: Diagnosis present

## 2019-11-23 DIAGNOSIS — N183 Chronic kidney disease, stage 3 unspecified: Secondary | ICD-10-CM | POA: Diagnosis present

## 2019-11-23 DIAGNOSIS — Z20828 Contact with and (suspected) exposure to other viral communicable diseases: Secondary | ICD-10-CM | POA: Diagnosis present

## 2019-11-23 DIAGNOSIS — J9610 Chronic respiratory failure, unspecified whether with hypoxia or hypercapnia: Secondary | ICD-10-CM | POA: Diagnosis present

## 2019-11-23 DIAGNOSIS — W010XXA Fall on same level from slipping, tripping and stumbling without subsequent striking against object, initial encounter: Secondary | ICD-10-CM | POA: Diagnosis present

## 2019-11-23 DIAGNOSIS — S32502A Unspecified fracture of left pubis, initial encounter for closed fracture: Secondary | ICD-10-CM | POA: Diagnosis not present

## 2019-11-23 DIAGNOSIS — L97212 Non-pressure chronic ulcer of right calf with fat layer exposed: Secondary | ICD-10-CM | POA: Diagnosis not present

## 2019-11-23 DIAGNOSIS — K219 Gastro-esophageal reflux disease without esophagitis: Secondary | ICD-10-CM | POA: Diagnosis present

## 2019-11-23 DIAGNOSIS — Z882 Allergy status to sulfonamides status: Secondary | ICD-10-CM

## 2019-11-23 DIAGNOSIS — Z89021 Acquired absence of right finger(s): Secondary | ICD-10-CM

## 2019-11-23 LAB — CBC WITH DIFFERENTIAL/PLATELET
Abs Immature Granulocytes: 0.16 10*3/uL — ABNORMAL HIGH (ref 0.00–0.07)
Basophils Absolute: 0 10*3/uL (ref 0.0–0.1)
Basophils Relative: 0 %
Eosinophils Absolute: 0 10*3/uL (ref 0.0–0.5)
Eosinophils Relative: 0 %
HCT: 33.8 % — ABNORMAL LOW (ref 36.0–46.0)
Hemoglobin: 10.7 g/dL — ABNORMAL LOW (ref 12.0–15.0)
Immature Granulocytes: 2 %
Lymphocytes Relative: 4 %
Lymphs Abs: 0.3 10*3/uL — ABNORMAL LOW (ref 0.7–4.0)
MCH: 32.4 pg (ref 26.0–34.0)
MCHC: 31.7 g/dL (ref 30.0–36.0)
MCV: 102.4 fL — ABNORMAL HIGH (ref 80.0–100.0)
Monocytes Absolute: 0.5 10*3/uL (ref 0.1–1.0)
Monocytes Relative: 7 %
Neutro Abs: 6.6 10*3/uL (ref 1.7–7.7)
Neutrophils Relative %: 87 %
Platelets: 299 10*3/uL (ref 150–400)
RBC: 3.3 MIL/uL — ABNORMAL LOW (ref 3.87–5.11)
RDW: 13.9 % (ref 11.5–15.5)
WBC: 7.6 10*3/uL (ref 4.0–10.5)
nRBC: 0 % (ref 0.0–0.2)

## 2019-11-23 LAB — COMPREHENSIVE METABOLIC PANEL
ALT: 41 U/L (ref 0–44)
AST: 45 U/L — ABNORMAL HIGH (ref 15–41)
Albumin: 3.3 g/dL — ABNORMAL LOW (ref 3.5–5.0)
Alkaline Phosphatase: 130 U/L — ABNORMAL HIGH (ref 38–126)
Anion gap: 13 (ref 5–15)
BUN: 22 mg/dL (ref 8–23)
CO2: 28 mmol/L (ref 22–32)
Calcium: 8 mg/dL — ABNORMAL LOW (ref 8.9–10.3)
Chloride: 92 mmol/L — ABNORMAL LOW (ref 98–111)
Creatinine, Ser: 1.33 mg/dL — ABNORMAL HIGH (ref 0.44–1.00)
GFR calc Af Amer: 47 mL/min — ABNORMAL LOW (ref >60)
GFR calc non Af Amer: 40 mL/min — ABNORMAL LOW (ref >60)
Glucose, Bld: 124 mg/dL — ABNORMAL HIGH (ref 70–99)
Potassium: 3.2 mmol/L — ABNORMAL LOW (ref 3.5–5.1)
Sodium: 133 mmol/L — ABNORMAL LOW (ref 135–145)
Total Bilirubin: 0.6 mg/dL (ref 0.3–1.2)
Total Protein: 6.9 g/dL (ref 6.5–8.1)

## 2019-11-23 LAB — TROPONIN I (HIGH SENSITIVITY)
Troponin I (High Sensitivity): 19 ng/L — ABNORMAL HIGH (ref <18)
Troponin I (High Sensitivity): 17 ng/L (ref <18)

## 2019-11-23 LAB — MAGNESIUM: Magnesium: 0.8 mg/dL — CL (ref 1.7–2.4)

## 2019-11-23 LAB — SURGICAL PCR SCREEN
MRSA, PCR: NEGATIVE
Staphylococcus aureus: POSITIVE — AB

## 2019-11-23 LAB — SARS CORONAVIRUS 2 (TAT 6-24 HRS): SARS Coronavirus 2: NEGATIVE

## 2019-11-23 LAB — PHOSPHORUS: Phosphorus: 4.5 mg/dL (ref 2.5–4.6)

## 2019-11-23 MED ORDER — BACITRACIN ZINC 500 UNIT/GM EX OINT
TOPICAL_OINTMENT | Freq: Two times a day (BID) | CUTANEOUS | Status: DC
  Administered 2019-11-23: 1 via TOPICAL
  Administered 2019-11-24 – 2019-11-26 (×5): via TOPICAL
  Filled 2019-11-23: qty 0.9
  Filled 2019-11-23: qty 28.4

## 2019-11-23 MED ORDER — NYSTATIN 100000 UNIT/ML MT SUSP
5.0000 mL | Freq: Four times a day (QID) | OROMUCOSAL | Status: DC
  Administered 2019-11-24 – 2019-11-26 (×9): 500000 [IU] via ORAL
  Filled 2019-11-23 (×15): qty 5

## 2019-11-23 MED ORDER — CHLORHEXIDINE GLUCONATE 4 % EX LIQD
60.0000 mL | Freq: Once | CUTANEOUS | Status: AC
  Administered 2019-11-24: 4 via TOPICAL
  Filled 2019-11-23: qty 60

## 2019-11-23 MED ORDER — SODIUM CHLORIDE 0.9 % IV SOLN
INTRAVENOUS | Status: DC
  Administered 2019-11-24 – 2019-11-25 (×2): via INTRAVENOUS

## 2019-11-23 MED ORDER — HYDROCODONE-ACETAMINOPHEN 5-325 MG PO TABS
1.0000 | ORAL_TABLET | Freq: Four times a day (QID) | ORAL | Status: DC | PRN
  Filled 2019-11-23: qty 1

## 2019-11-23 MED ORDER — CLOPIDOGREL BISULFATE 75 MG PO TABS
75.0000 mg | ORAL_TABLET | Freq: Every day | ORAL | Status: DC
  Administered 2019-11-24 – 2019-11-26 (×3): 75 mg via ORAL
  Filled 2019-11-23 (×3): qty 1

## 2019-11-23 MED ORDER — LOPERAMIDE HCL 2 MG PO CAPS
2.0000 mg | ORAL_CAPSULE | Freq: Once | ORAL | Status: AC
  Administered 2019-11-23: 2 mg via ORAL
  Filled 2019-11-23: qty 1

## 2019-11-23 MED ORDER — SODIUM CHLORIDE 0.9% FLUSH
3.0000 mL | Freq: Two times a day (BID) | INTRAVENOUS | Status: DC
  Administered 2019-11-23 – 2019-11-25 (×4): 3 mL via INTRAVENOUS

## 2019-11-23 MED ORDER — ALBUTEROL SULFATE (2.5 MG/3ML) 0.083% IN NEBU: 2.5000 mg | INHALATION_SOLUTION | Freq: Four times a day (QID) | RESPIRATORY_TRACT | Status: DC | PRN

## 2019-11-23 MED ORDER — ENOXAPARIN SODIUM 40 MG/0.4ML ~~LOC~~ SOLN: 40.0000 mg | SUBCUTANEOUS | Status: DC

## 2019-11-23 MED ORDER — MAGNESIUM SULFATE 2 GM/50ML IV SOLN
2.0000 g | Freq: Once | INTRAVENOUS | Status: AC
  Administered 2019-11-24: 2 g via INTRAVENOUS
  Filled 2019-11-23: qty 50

## 2019-11-23 MED ORDER — DOXYCYCLINE HYCLATE 100 MG PO CAPS: 100.0000 mg | ORAL_CAPSULE | Freq: Two times a day (BID) | ORAL | 0 refills | Status: DC

## 2019-11-23 MED ORDER — ACETAMINOPHEN 650 MG RE SUPP: 650.0000 mg | Freq: Four times a day (QID) | RECTAL | Status: DC | PRN

## 2019-11-23 MED ORDER — MAGNESIUM SULFATE 2 GM/50ML IV SOLN: 2.0000 g | Freq: Once | INTRAVENOUS | Status: DC

## 2019-11-23 MED ORDER — POVIDONE-IODINE 10 % EX SWAB
2.0000 "application " | Freq: Once | CUTANEOUS | Status: AC
  Administered 2019-11-24: 2 via TOPICAL

## 2019-11-23 MED ORDER — MAGNESIUM SULFATE 2 GM/50ML IV SOLN
2.0000 g | Freq: Once | INTRAVENOUS | Status: AC
  Administered 2019-11-23: 2 g via INTRAVENOUS
  Filled 2019-11-23: qty 50

## 2019-11-23 MED ORDER — LEVOTHYROXINE SODIUM 50 MCG PO TABS
50.0000 ug | ORAL_TABLET | Freq: Every day | ORAL | Status: DC
  Administered 2019-11-24 – 2019-11-26 (×3): 50 ug via ORAL
  Filled 2019-11-23 (×3): qty 1

## 2019-11-23 MED ORDER — SACCHAROMYCES BOULARDII 250 MG PO CAPS
250.0000 mg | ORAL_CAPSULE | Freq: Two times a day (BID) | ORAL | Status: DC
  Administered 2019-11-23 – 2019-11-26 (×6): 250 mg via ORAL
  Filled 2019-11-23 (×6): qty 1

## 2019-11-23 MED ORDER — POTASSIUM CHLORIDE 20 MEQ/15ML (10%) PO SOLN
40.0000 meq | Freq: Once | ORAL | Status: AC
  Administered 2019-11-23: 40 meq via ORAL
  Filled 2019-11-23: qty 30

## 2019-11-23 MED ORDER — CALCIUM GLUCONATE-NACL 1-0.675 GM/50ML-% IV SOLN
1.0000 g | Freq: Once | INTRAVENOUS | Status: AC
  Administered 2019-11-23: 1000 mg via INTRAVENOUS
  Filled 2019-11-23: qty 50

## 2019-11-23 MED ORDER — POTASSIUM CHLORIDE CRYS ER 20 MEQ PO TBCR
40.0000 meq | EXTENDED_RELEASE_TABLET | Freq: Once | ORAL | Status: DC
  Filled 2019-11-23 (×2): qty 2

## 2019-11-23 MED ORDER — MACITENTAN 10 MG PO TABS
10.0000 mg | ORAL_TABLET | Freq: Every day | ORAL | Status: DC
  Administered 2019-11-24 – 2019-11-26 (×3): 10 mg via ORAL
  Filled 2019-11-23 (×3): qty 1

## 2019-11-23 MED ORDER — CLINDAMYCIN PHOSPHATE 900 MG/50ML IV SOLN
900.0000 mg | INTRAVENOUS | Status: AC
  Administered 2019-11-24: 900 mg via INTRAVENOUS
  Filled 2019-11-23: qty 50

## 2019-11-23 MED ORDER — ATORVASTATIN CALCIUM 10 MG PO TABS
20.0000 mg | ORAL_TABLET | Freq: Every day | ORAL | Status: DC
  Administered 2019-11-24 – 2019-11-25 (×2): 20 mg via ORAL
  Filled 2019-11-23 (×2): qty 2

## 2019-11-23 MED ORDER — CYCLOSPORINE 0.05 % OP EMUL
1.0000 [drp] | Freq: Two times a day (BID) | OPHTHALMIC | Status: DC
  Administered 2019-11-24 – 2019-11-26 (×6): 1 [drp] via OPHTHALMIC
  Filled 2019-11-23 (×8): qty 30

## 2019-11-23 MED ORDER — POLYSACCHARIDE IRON COMPLEX 150 MG PO CAPS
150.0000 mg | ORAL_CAPSULE | Freq: Two times a day (BID) | ORAL | Status: DC
  Administered 2019-11-23 – 2019-11-26 (×5): 150 mg via ORAL
  Filled 2019-11-23 (×5): qty 1

## 2019-11-23 MED ORDER — ACETAMINOPHEN 325 MG PO TABS
650.0000 mg | ORAL_TABLET | Freq: Four times a day (QID) | ORAL | Status: DC | PRN
  Administered 2019-11-23 – 2019-11-25 (×2): 650 mg via ORAL
  Filled 2019-11-23 (×2): qty 2

## 2019-11-23 MED ORDER — MORPHINE SULFATE (PF) 2 MG/ML IV SOLN
1.0000 mg | INTRAVENOUS | Status: DC | PRN
  Administered 2019-11-24 (×2): 1 mg via INTRAVENOUS
  Filled 2019-11-23 (×3): qty 1

## 2019-11-23 NOTE — H&P (Addendum)
History and Physical    [name] [name] ZCH:[id number] DOB: May 26, 1951 DOA: 11/23/2019  Referring MD/NP/PA: Krista Blue, PA-C PCP: [name], MD  Patient coming from: Home via EMS  Chief Complaint: Fall  I have personally briefly reviewed patient's old medical records in [place]   HPI: [name] [name] is a 68 y.o. 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  She presents after having a fall at home while walking into the house coming from wound care center.  She is unsure if she passed out or tripped.  States that she has been feeling a little lightheaded.  Her husband reports he was at the car with his back turned at the time and heard her fall.  There after she was unable to stand up or bear weight due to pain.  Denies having any chest pain, worsening shortness of breath, abdominal pain, nausea, vomiting, or fevers.  She had just recently been discharged home 1 week ago after presenting for dehydration with multiple electrolyte abnormalities related to diarrhea.  When she got home she reports that she had been trying to take magnesium supplements, but reported that it caused worsening of her diarrhea.  Husband reports that they had just been trying to eat foods rich in magnesium instead.   ED Course: Upon admission to the emergency department patient was noted to have stable vital signs.  Labs significant for hemoglobin 10.7, sodium 133, potassium 3.2, magnesium 0.8, calcium 8, AST 45, and troponin 1.9.   CT scan of the brain did not show any acute abnormalities.  X-rays revealed an acute displaced distal radius fracture on the left wrist along with inferior and superior pubic left ramus fractures.  Dr. Mardelle Matte of orthopedics was consulted.  [facility] called me  Review of  Systems  Constitutional: Positive for malaise/fatigue. Negative for chills and fever.  HENT: Negative for congestion and nosebleeds.   Eyes: Negative for photophobia.  Respiratory: Negative for sputum production and shortness of breath.   Cardiovascular: Negative for chest pain and leg swelling.  Gastrointestinal: Negative for abdominal pain, nausea and vomiting.  Genitourinary: Negative for dysuria and hematuria.  Musculoskeletal: Positive for falls and joint pain.  Skin: Negative for rash.  Neurological: Positive for dizziness and weakness.  Psychiatric/Behavioral: Positive for memory loss. Negative for substance abuse.     Past Medical History:  Diagnosis Date  . Anemia   . Arthritis   . DOE (dyspnea on exertion)   . Epistaxis   . History of chicken pox   . Hypertension   . Hypothyroidism   . Leg ulcer ([place])   . Melanoma ([place]) 1999  . 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[place])   . Non Hodgkin's lymphoma ([place])    Treated with chemo 2010, felt to be cured.  Marland Kitchen PAH (pulmonary artery hypertension) ([place])   . Peripheral arterial occlusive disease ([place])   . Pulmonary hypertension ([place])   . Requires supplemental oxygen    2 liters-3liters when moving  . Sclerodermia generalized [facility])     Past Surgical History:  Procedure Laterality Date  . AMPUTATION Right 10/24/2017   Procedure: RAY resection right index;  Surgeon: Daryll Brod, MD;  Location: [place];  Service: Orthopedics;  Laterality: Right;  Axillary block  . AMPUTATION Right 11/21/2018   Procedure: AMPUTATION RAY RIGHT MIDDLE FINGER;  Surgeon: Daryll Brod, MD;  Location: [place];  Service: Orthopedics;  Laterality: Right;  . AMPUTATION Right 10/22/2019   Procedure: AMPUTATION DIGIT RIGHT RING AND SMALL FINGER, DIGITAL SYMPATHECTOMY RIGHT RADIAL ULNAR ARCH;  Surgeon: Daryll Brod, MD;  Location: [place];  Service: Orthopedics;  Laterality: Right;  AXILARY  . apligraph  12-04-2102,   12-18-2012   bilateral calves   [name]  . biopsy on cerebellum    . COLONOSCOPY WITH PROPOFOL Left 10/07/2015   Procedure: COLONOSCOPY WITH PROPOFOL;  Surgeon: [name], MD;  Location: [place];  Service: Endoscopy;  Laterality: Left;  . ENDARTERECTOMY FEMORAL Right 09/14/2016   Procedure: ENDARTERECTOMY FEMORAL RIGHT;  Surgeon: [name], MD;  Location: [facility] OR;  Service: Vascular;  Laterality: Right;  . ENDOVENOUS ABLATION SAPHENOUS VEIN W/ LASER  07-24-2012   right greater saphenous vein by [name], M.D.   . ENDOVENOUS ABLATION SAPHENOUS VEIN W/ LASER  10-16-2012   left greater saphenous vein  by Dr. [name]  . ENDOVENOUS ABLATION SAPHENOUS VEIN W/ LASER Right 09-01-2015   endovenous laser ablation right greater saphenous vein by [name] MD  . ESOPHAGOGASTRODUODENOSCOPY N/A 05/07/2016   Procedure: ESOPHAGOGASTRODUODENOSCOPY (EGD);  Surgeon: [name], MD;  Location: [facility] ENDOSCOPY;  Service: Endoscopy;  Laterality: N/A;  . ESOPHAGOGASTRODUODENOSCOPY (EGD) WITH PROPOFOL Left 10/07/2015   Procedure: ESOPHAGOGASTRODUODENOSCOPY (EGD) WITH PROPOFOL;  Surgeon: [name], MD;  Location: [place];  Service: Endoscopy;  Laterality: Left;  . GIVENS CAPSULE STUDY N/A 07/31/2016   Procedure: GIVENS CAPSULE STUDY;  Surgeon: [name], MD;  Location: [facility] ENDOSCOPY;  Service: Endoscopy;  Laterality: N/A;  . melonoma removes Right    behind right knee  . PATCH ANGIOPLASTY Right 09/14/2016   Procedure: PATCH ANGIOPLASTY RIGHT FEMORAL ARTERY USING HEMASHIELD PLATINUM FINESSE PATCH;  Surgeon: [name], MD;  Location: [place];  Service: Vascular;  Laterality: Right;  . PERIPHERAL VASCULAR CATHETERIZATION N/A 09/07/2016   Procedure: Abdominal Aortogram w/Lower Extremity;  Surgeon: [name], MD;  Location: [place] CV LAB;  Service: Cardiovascular;  Laterality: N/A;  . RIGHT HEART CATH N/A 04/19/2017   Procedure: Right Heart Cath;  Surgeon: [name], MD;  Location: [place] CV LAB;   Service: Cardiovascular;  Laterality: N/A;  . RIGHT HEART CATHETERIZATION N/A 11/14/2012   Procedure: RIGHT HEART CATH;  Surgeon: [name], MD;  Location: [facility] CATH LAB;  Service: Cardiovascular;  Laterality: N/A;  . rt little toe removal  10/1998  . stent left thigh  3/11  . TENDON TRANSFER Right 10/24/2017   Procedure: transfer extensor indecus propius/extensor digitorum commomus index to middle ring and small;  Surgeon: Daryll Brod, MD;  Location: [place];  Service: Orthopedics;  Laterality: Right;  . TONSILLECTOMY    . TONSILLECTOMY AND ADENOIDECTOMY    . TUNNELED VENOUS CATHETER PLACEMENT  01/2013  . ULNAR HEAD EXCISION Right 10/24/2017   Procedure: darrach right ulna;  Surgeon: Daryll Brod, MD;  Location: [place];  Service: Orthopedics;  Laterality: Right;  . VEIN LIGATION AND STRIPPING  05/16/2012   Procedure: VEIN LIGATION AND STRIPPING;  Surgeon: [name], MD;  Location: [facility] OR;  Service: Vascular;  Laterality: Right;  Irrigation and Debridement right lower leg, ligation of saphenous vein.     reports that she has never smoked. She has never used smokeless tobacco. She reports current alcohol use. She reports that she does not use drugs.  Allergies  Allergen Reactions  . Levofloxacin Other (See Comments)  FLU LIKE SYMPTOM Other reaction(s): Other (See Comments) FLU LIKE SYMPTOM  . Sulfa Antibiotics Rash and Other (See Comments)    Face peeled Severe rash with significant peeling of face. No airway involvement per patient Face peeled  . Levofloxacin In D5w Other (See Comments)    FLU LIKE SYMPTOMS FLU LIKE SYMPTOMS  . Doxycycline Hyclate Swelling    SWELLING REACTION UNSPECIFIED  "TABLETS ONLY - CAPSULES ARE TOLERATED FINE"  . Penicillins Rash    Has patient had a PCN reaction causing immediate rash, facial/tongue/throat swelling, SOB or lightheadedness with hypotension: no Has patient had a PCN reaction causing severe rash involving mucus membranes or skin  necrosis: no Has patient had a PCN reaction that required hospitalization: no Has patient had a PCN reaction occurring within the last 10 years: {no If all of the above answers are "NO", then may proceed with Cephalosporin use.    Family History  Problem Relation Age of Onset  . Lupus Father   . Lymphoma Father   . Hyperlipidemia Mother   . Cancer Daughter        sarcoma  . Colon cancer Other     Prior to Admission medications   Medication Sig Start Date End Date Taking? Authorizing Provider  acetaminophen (TYLENOL ARTHRITIS PAIN) 650 MG CR tablet Take 1,300 mg by mouth every 8 (eight) hours.     [provider]  atorvastatin (LIPITOR) 20 MG tablet Take 1 tablet (20 mg total) by mouth daily. 06/18/19   [name], MD  Biotin 5000 MCG TABS Take 5,000 mcg by mouth daily.    [provider]  CALCIUM-MAGNESIUM-VITAMIN D PO Take 2 tablets by mouth daily.    [provider]  Cholecalciferol (VITAMIN D) 2000 units CAPS Take 2,000 Units by mouth daily.    [provider]  citalopram (CELEXA) 20 MG tablet TAKE 1 TABLET BY MOUTH EVERYDAY AT BEDTIME Patient taking differently: Take 20 mg by mouth daily.  07/11/19   [name], MD  clopidogrel (PLAVIX) 75 MG tablet TAKE 1 TABLET BY MOUTH EVERY DAY Patient taking differently: Take 75 mg by mouth daily.  03/11/19   Larey Dresser, MD  diphenoxylate-atropine (LOMOTIL) 2.5-0.025 MG tablet Take 1 tablet by mouth 4 (four) times daily as needed for diarrhea or loose stools. 11/15/19   Osei-Bonsu, Iona Beard, MD  FERREX 150 150 MG capsule TAKE ONE CAPSULE BY MOUTH TWICE A DAY Patient taking differently: Take 150 mg by mouth 2 (two) times daily.  01/26/19   [name], MD  fish oil-omega-3 fatty acids 1000 MG capsule Take 1,000 mg by mouth 2 (two) times daily.     [provider]  gabapentin (NEURONTIN) 300 MG capsule Take 1 capsule (300 mg total) by mouth at bedtime. 11/15/19   Benito Mccreedy, MD   HYDROcodone-acetaminophen (NORCO) 5-325 MG tablet Take 1 tablet by mouth every 6 (six) hours as needed for moderate pain. 10/22/19   Daryll Brod, MD  levothyroxine (SYNTHROID) 50 MCG tablet TAKE 1 TABLET BY MOUTH DAILY BEFORE BREAKFAST Patient taking differently: Take 50 mcg by mouth daily before breakfast.  09/09/19   Baxley, Cresenciano Lick, MD  magnesium oxide (MAG-OX) 400 (241.3 Mg) MG tablet Take 0.5 tablets (200 mg total) by mouth 2 (two) times daily. 11/15/19   Benito Mccreedy, MD  Multiple Vitamin (MULTIVITAMIN WITH MINERALS) TABS tablet Take 1 tablet by mouth daily.    [provider]  nystatin (MYCOSTATIN) 100000 UNIT/ML suspension TAKE 5 MLS (500,000  UNITS TOTAL) BY MOUTH 4 (FOUR) TIMES DAILY. 11/22/19   [name], MD  ondansetron (ZOFRAN ODT) 4 MG disintegrating tablet Take 1 tablet (4 mg total) by mouth every 8 (eight) hours as needed for nausea or vomiting. 11/08/19   Virgel Manifold, MD  OPSUMIT 10 MG tablet Take 1 tablet (21m) by mouth daily. 04/10/19   Bensimhon, DShaune Pascal MD  OXYGEN Inhale 2-3 L into the lungs continuous.     [provider]  pantoprazole (PROTONIX) 40 MG tablet Take 1 tablet (40 mg total) by mouth 2 (two) times daily. 05/08/16   DRobbie Lis MD  Probiotic CAPS Take 1 capsule by mouth daily.    [provider]  RESTASIS 0.05 % ophthalmic emulsion Place 1 drop into both eyes 2 (two) times daily. 08/28/19   [provider]  saccharomyces boulardii (FLORASTOR) 250 MG capsule Take 1 capsule (250 mg total) by mouth 2 (two) times daily. 11/15/19   OBenito Mccreedy MD  valACYclovir (VALTREX) 500 MG tablet Take 1 tablet (500 mg total) by mouth 2 (two) times daily. Patient taking differently: Take 500 mg by mouth 2 (two) times daily as needed (cold sores).  09/02/19   BElby Showers MD  vitamin B-12 (CYANOCOBALAMIN) 1000 MCG tablet Take 1,000 mcg by mouth daily.    [provider]    Physical Exam:  Constitutional: Elderly  female who appears to be NAD, calm, comfortable Vitals:   11/23/19 1335 11/23/19 1336 11/23/19 1337 11/23/19 1430  BP:  (!) 123/57  (!) 131/56  Pulse:  73  71  Resp:  15  19  Temp:  98.2 F (36.8 C)    TempSrc:  Oral    SpO2: 100% 100%  97%  Weight:   61.7 kg   Height:   _0  (1.727 m)    Eyes: PERRL, lids and conjunctivae normal ENMT: Mucous membranes are dry. Posterior pharynx clear of any exudate or lesions. n.  Neck: normal, supple, no masses, no thyromegaly Respiratory: Decreased overall aeration with no significant wheezes or rhonchi appreciated.  Patient currently on 2.5 L nasal cannula oxygen maintaining O2 saturations. Cardiovascular: Regular rate and rhythm, no murmurs / rubs / gallops. No extremity edema. 2+ pedal pulses. No carotid bruits.  Abdomen: no tenderness, no masses palpated. No hepatosplenomegaly. Bowel sounds positive.  Musculoskeletal: no clubbing / cyanosis.  Tenderness palpation of the left hip and left distal radius. Skin: Bruise and laceration to the left temple.  Right leg bandage. Neurologic: CN 2-12 grossly intact. Sensation intact, DTR normal. Strength 5/5 in all 4.  Psychiatric: Normal judgment and insight. Alert and oriented x 3. Normal mood.     Labs on Admission: I have personally reviewed following labs and imaging studies  CBC: Recent Labs  Lab 11/18/19 1159 11/23/19 1411  WBC 7.4 7.6  NEUTROABS 6,223 6.6  HGB 9.6* 10.7*  HCT 29.9* 33.8*  MCV 100.0 102.4*  PLT 331 2096  Basic Metabolic Panel: Recent Labs  Lab 11/18/19 1159 11/23/19 1455  NA 138 133*  K 3.9 3.2*  CL 100 92*  CO2 25 28  GLUCOSE 87 124*  BUN 17 22  CREATININE 1.24* 1.33*  CALCIUM 7.1* 8.0*  MG 0.9* 0.8*  PHOS  --  4.5   GFR: Estimated Creatinine Clearance: 38.3 mL/min (A) (by C-G formula based on SCr of 1.33 mg/dL (H)). Liver Function Tests: Recent Labs  Lab 11/18/19 1159 11/23/19 1455  AST 32 45*  ALT 73* 41  ALKPHOS  --  130*  BILITOT 0.4 0.6   PROT 5.9* 6.9  ALBUMIN  --  3.3*   No results for input(s): LIPASE, AMYLASE in the last 168 hours. No results for input(s): AMMONIA in the last 168 hours. Coagulation Profile: No results for input(s): INR, PROTIME in the last 168 hours. Cardiac Enzymes: No results for input(s): CKTOTAL, CKMB, CKMBINDEX, TROPONINI in the last 168 hours. BNP (last 3 results) No results for input(s): PROBNP in the last 8760 hours. HbA1C: No results for input(s): HGBA1C in the last 72 hours. CBG: No results for input(s): GLUCAP in the last 168 hours. Lipid Profile: No results for input(s): CHOL, HDL, LDLCALC, TRIG, CHOLHDL, LDLDIRECT in the last 72 hours. Thyroid Function Tests: No results for input(s): TSH, T4TOTAL, FREET4, T3FREE, THYROIDAB in the last 72 hours. Anemia Panel: No results for input(s): VITAMINB12, FOLATE, FERRITIN, TIBC, IRON, RETICCTPCT in the last 72 hours. Urine analysis:    Component Value Date/Time   COLORURINE YELLOW 11/10/2019 2102   APPEARANCEUR HAZY (A) 11/10/2019 2102   LABSPEC 1.013 11/10/2019 2102   PHURINE 5.0 11/10/2019 2102   GLUCOSEU NEGATIVE 11/10/2019 2102   HGBUR SMALL (A) 11/10/2019 2102   BILIRUBINUR NEGATIVE 11/10/2019 2102   BILIRUBINUR NEG 12/10/2018 1211   KETONESUR NEGATIVE 11/10/2019 2102   PROTEINUR 100 (A) 11/10/2019 2102   UROBILINOGEN 0.2 12/10/2018 1211   NITRITE NEGATIVE 11/10/2019 2102   LEUKOCYTESUR MODERATE (A) 11/10/2019 2102   Sepsis Labs: No results found for this or any previous visit (from the past 240 hour(s)).   Radiological Exams on Admission: Dg Wrist Complete Left  Result Date: 11/23/2019 CLINICAL DATA:  Fall EXAM: LEFT WRIST - COMPLETE 3+ VIEW COMPARISON:  None. FINDINGS: Bones appear osteopenic. Acute impacted fracture involving the distal radius with mild dorsal displacement of distal fracture fragment. No dislocation. Radial subluxation base of the first metacarpal, uncertain chronicity. Soft tissue calcifications at the  digits. IMPRESSION: 1. Acute impacted and mildly displaced distal radius fracture 2. Radial subluxation base of the first metacarpal of uncertain chronicity Electronically Signed   By: Donavan Foil M.D.   On: 11/23/2019 15:42   Ct Head Wo Contrast  Result Date: 11/23/2019 CLINICAL DATA:  Head trauma. Patient on anticoagulation. Hematoma to the left eye. EXAM: CT HEAD WITHOUT CONTRAST TECHNIQUE: Contiguous axial images were obtained from the base of the skull through the vertex without intravenous contrast. COMPARISON:  November 11, 2019. FINDINGS: Brain: No evidence of acute infarction, hemorrhage, hydrocephalus, extra-axial collection or mass lesion/mass effect. Chronic microvascular ischemic changes are again noted. There are postsurgical changes of the posterior right cerebellum. Vascular: No hyperdense vessel or unexpected calcification. Skull: Normal. Negative for fracture or focal lesion. Sinuses/Orbits: No acute finding. There is left periorbital soft tissue swelling without evidence for an underlying fracture. Other: None. IMPRESSION: No acute intracranial abnormality. Chronic findings as detailed above, not significantly changed from recent prior CT. Electronically Signed   By: Constance Holster M.D.   On: 11/23/2019 15:07   Dg Shoulder Left  Result Date: 11/23/2019 CLINICAL DATA:  Fall with shoulder pain EXAM: LEFT SHOULDER - 2+ VIEW COMPARISON:  None. FINDINGS: No fracture or malalignment.  AC joint appears intact. IMPRESSION: No acute osseous abnormality. Electronically Signed   By: Donavan Foil M.D.   On: 11/23/2019 15:40   Dg Hip Unilat W Or Wo Pelvis 2-3 Views Left  Result Date: 11/23/2019 CLINICAL DATA:  Fall EXAM: DG HIP (WITH OR WITHOUT PELVIS) 2-3V LEFT COMPARISON:  CT 11/08/2019  FINDINGS: Acute displaced fractures involving the left superior and inferior pubic rami. The left femoral head is normally position. There are vascular calcifications. IMPRESSION: Acute displaced  fractures involving the left superior and inferior pubic rami. Electronically Signed   By: Donavan Foil M.D.   On: 11/23/2019 15:39    EKG: Independently reviewed.  Sinus rhythm at 73 bpm with QTC 527.  Assessment/Plan Pubic ramus fractures and displaced distal radius fracture secondary to fall: Patient presents after having a fall at home coming from the wound care center.  Imaging studies revealed left pubic ramus fractures and left distal radius fracture.  Dr. Mardelle Matte of orthopedics was consulted. -Admit to a medical telemetry bed -Pain control -Appreciate orthopedic consultative services, we will follow-up for further recommendations  Hypomagnesemia: Acute on chronic. On admission magnesium 0.8.  Patient was given 2 g of magnesium sulfate IV in the ED. She reports inability to take oral magnesium replacement due to diarrhea. -Give additional 2 g of magnesium sulfate -Continue to monitor and replace as needed  Hypokalemia: Acute on chronic.  Potassium 3.2 on admission.  Patient was given 40 mEq of potassium chloride. -Continue to monitor and replace as needed  Prolonged QT interval: Acute.  On admission QT interval 527. -Hold QT prolonging medication -Correct electrolyte abnormality -Recheck EKG in a.m.  Chronic respiratory failure with hypoxia due to pulmonary hypertension: At home patient on 2 to 3 L depending on her activity level.  O2 saturations maintained on her home 2 to 3 L. -Continue nasal cannula oxygen of 2 to 3 L Chronic kidney disease stage III: Patient creatinine appears relatively stable at 1.33. -Continue to monitor  Pulmonary artery hypertension: Also likely related to history of scleroderma. -Continue Opsumit  Hypothyroidism: Last TSH within normal limits at 1.975 on 11/11/2019. -Continue levothyroxine   Peripheral vascular disease secondary to scleroderma -Continue Plavix and statin  Depression -Held Celexa due to prolonged QTC  Hyperlipidemia -Continue  atorvastatin   Neuropathic ulcer: Patient currently followed in outpatient setting by wound care. -Continue routine wound care  GERD -Held Protonix due to prolonged QT  DVT prophylaxis: Lovenox Code Status: Full Family Communication: Plan of care discussed with the patient and her husband is present at bedside Disposition Plan: TBD Consults called: none Admission status: observation  Norval Morton MD [facility] Pager [phone]   If 7PM-7AM, please contact night-coverage [email or web address] Password [email or web address]  11/23/2019, 4:37 PM

## 2019-11-23 NOTE — Consult Note (Addendum)
Reason for Consults: Left wrist pain, left hip pain Consulting Physician: Krista Blue, PA-C  HPI:   Patient presented with left wrist and left hip pain after mechanical fall. She is unsure how the fall happened but does note dizziness today. She has been feeling light headed but is unsure if she passed out. Since the fall, she has had constant achy left wrist and left buttock pain. Pain is worse with any movement of the wrist or left hip. Patient is currently on Plavix.  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 Daryll Brod.  Denies chest pain, abdominal pain, fever or chills.    Past Medical History: Past Medical History:  Diagnosis Date  . Anemia   . Arthritis   . DOE (dyspnea on exertion)   . Epistaxis   . History of chicken pox   . Hypertension   . Hypothyroidism   . Leg ulcer ([place])   . Melanoma ([place]) 1999  . 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 Non Hodgkin's lymphoma ([place])    Treated with chemo 2010, felt to be cured.  Marland Kitchen PAH (pulmonary artery hypertension) ([place])   . Peripheral arterial occlusive disease ([place])   . Pulmonary hypertension ([place])   . Requires supplemental oxygen    2 liters-3liters when moving  . 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 AMPUTATION Right 10/24/2017   Procedure: RAY resection right index;  Surgeon: Daryll Brod, MD;  Location: [place];  Service: Orthopedics;  Laterality: Right;  Axillary block  . AMPUTATION Right 11/21/2018   Procedure: AMPUTATION RAY RIGHT MIDDLE FINGER;  Surgeon: Daryll Brod, MD;  Location: [place];  Service: Orthopedics;  Laterality: Right;  . AMPUTATION Right 10/22/2019   Procedure: AMPUTATION DIGIT RIGHT RING AND SMALL FINGER, DIGITAL SYMPATHECTOMY RIGHT RADIAL ULNAR ARCH;  Surgeon: Daryll Brod, MD;  Location: [place];  Service: Orthopedics;   Laterality: Right;  AXILARY  . apligraph  12-04-2102,   12-18-2012   bilateral calves   [name]  . biopsy on cerebellum    . COLONOSCOPY WITH PROPOFOL Left 10/07/2015   Procedure: COLONOSCOPY WITH PROPOFOL;  Surgeon: [name], MD;  Location: [place];  Service: Endoscopy;  Laterality: Left;  . ENDARTERECTOMY FEMORAL Right 09/14/2016   Procedure: ENDARTERECTOMY FEMORAL RIGHT;  Surgeon: [name], MD;  Location: [facility] OR;  Service: Vascular;  Laterality: Right;  . ENDOVENOUS ABLATION SAPHENOUS VEIN W/ LASER  07-24-2012   right greater saphenous vein by [name], M.D.   . ENDOVENOUS ABLATION SAPHENOUS VEIN W/ LASER  10-16-2012   left greater saphenous vein  by Dr. [name]  . ENDOVENOUS ABLATION SAPHENOUS VEIN W/ LASER Right 09-01-2015   endovenous laser ablation right greater saphenous vein by [name] MD  . ESOPHAGOGASTRODUODENOSCOPY N/A 05/07/2016   Procedure: ESOPHAGOGASTRODUODENOSCOPY (EGD);  Surgeon: [name], MD;  Location: [facility] ENDOSCOPY;  Service: Endoscopy;  Laterality: N/A;  . ESOPHAGOGASTRODUODENOSCOPY (EGD) WITH PROPOFOL Left 10/07/2015   Procedure: ESOPHAGOGASTRODUODENOSCOPY (EGD) WITH PROPOFOL;  Surgeon: [name], MD;  Location: [place];  Service: Endoscopy;  Laterality: Left;  . GIVENS CAPSULE STUDY N/A 07/31/2016   Procedure: GIVENS CAPSULE STUDY;  Surgeon: [name], MD;  Location: [facility] ENDOSCOPY;  Service: Endoscopy;  Laterality: N/A;  . melonoma removes Right    behind right knee  . PATCH ANGIOPLASTY Right 09/14/2016  Procedure: PATCH ANGIOPLASTY RIGHT FEMORAL ARTERY USING HEMASHIELD PLATINUM FINESSE PATCH;  Surgeon: [name], MD;  Location: [place];  Service: Vascular;  Laterality: Right;  . PERIPHERAL VASCULAR CATHETERIZATION N/A 09/07/2016   Procedure: Abdominal Aortogram w/Lower Extremity;  Surgeon: [name], MD;  Location: [place] CV LAB;  Service: Cardiovascular;  Laterality: N/A;  . RIGHT HEART CATH N/A 04/19/2017    Procedure: Right Heart Cath;  Surgeon: [name], MD;  Location: [place] CV LAB;  Service: Cardiovascular;  Laterality: N/A;  . RIGHT HEART CATHETERIZATION N/A 11/14/2012   Procedure: RIGHT HEART CATH;  Surgeon: [name], MD;  Location: [facility] CATH LAB;  Service: Cardiovascular;  Laterality: N/A;  . rt little toe removal  10/1998  . stent left thigh  3/11  . TENDON TRANSFER Right 10/24/2017   Procedure: transfer extensor indecus propius/extensor digitorum commomus index to middle ring and small;  Surgeon: Daryll Brod, MD;  Location: [place];  Service: Orthopedics;  Laterality: Right;  . TONSILLECTOMY    . TONSILLECTOMY AND ADENOIDECTOMY    . TUNNELED VENOUS CATHETER PLACEMENT  01/2013  . ULNAR HEAD EXCISION Right 10/24/2017   Procedure: darrach right ulna;  Surgeon: Daryll Brod, MD;  Location: [place];  Service: Orthopedics;  Laterality: Right;  . VEIN LIGATION AND STRIPPING  05/16/2012   Procedure: VEIN LIGATION AND STRIPPING;  Surgeon: [name], MD;  Location: [facility] OR;  Service: Vascular;  Laterality: Right;  Irrigation and Debridement right lower leg, ligation of saphenous vein.    Social History: Social History   Tobacco Use  . Smoking status: Never Smoker  . Smokeless tobacco: Never Used  Substance Use Topics  . Alcohol use: Yes    Alcohol/week: 0.0 standard drinks    Comment: 1-2 drinks per day wine.liquor or beer  . Drug use: No    Allergies: Levofloxacin, Sulfa antibiotics, Levofloxacin in d5w, Doxycycline hyclate, and Penicillins  Current Medications: Scheduled Meds: . [START ON 11/24/2019] atorvastatin  20 mg Oral q1800  . bacitracin   Topical BID  . [START ON 11/24/2019] clopidogrel  75 mg Oral Daily  . cycloSPORINE  1 drop Both Eyes BID  . enoxaparin (LOVENOX) injection  40 mg Subcutaneous Q24H  . iron polysaccharides  150 mg Oral BID  . [START ON 11/24/2019] levothyroxine  50 mcg Oral Q0600  . macitentan  10 mg Oral Daily  . nystatin  5 mL Oral  QID  . saccharomyces boulardii  250 mg Oral BID  . sodium chloride flush  3 mL Intravenous Q12H   Physical Exam: General: Well-developed, thin and frail female. In no acute distress.  HENT:  Head: Normocephalic. Hematoma and ecchymosis on left temple. Eyes: Conjunctivae are normal. No discharge or scleral icterus.  Neck: Normal range of motion.  Cardiovascular: Normal rate and regular rhythm.  Respiratory: Effort normal. No respiratory distress.  Musculoskeletal: Right wrist visibly deformed with tenderness to palpation. Distal sensation and capillary refill intact. Patient able to flex extend and abduct all fingers of right hand.  Distal sensation and capillary refill of left lower extremity intact. Pain to palpation over left buttock.  Neurological: She is alert and responsive.  Skin: Skin is warm and dry. Psychiatric: She has a normal mood and affect. Her behavior is normal.   Assessment/ Plan: Left distal radius fracture - Dr. Marcelino Scot contacted for ORIF tomorrow. Patient notified and will be NPO at midnight. In the meantime she has been admitted to the hospitalist service.  Left superior and inferior pubic rami fractures - this will likely to handled non-operatively. Patient to follow-up after distal radius ORIF tomorrow. Patient will be WBAT.  Merlene Pulling, PA-C  Reviewed, agree with above.  This is an acute severe constellation of injuries, and especially given her history of oxygen dependency, recent discharge for dehydration, electrolyte abnormalities, diarrhea, I appreciate the [facility] hospitalists managing her complex medical comorbidities.  Hopefully we can fix her wrist and allow weightbearing through the elbow with a platform walker, plan is for surgery tomorrow, I am holding her Lovenox given her anticoagulation with Plavix, and surgery planned within the next 24 hours, especially in light of her renal insufficiency and chronic kidney disease.  I have spoken with Dr. Marcelino Scot  regarding her distal radius fracture.  Johnny Bridge, MD

## 2019-11-23 NOTE — Progress Notes (Signed)
[name] [name], [name] [name] ([id number]) Visit Report for 11/23/2019 Arrival Information Details Patient Name: Date of Service: [name] [name], [name] [name] 11/23/2019 9:00 AM Medical Record Number:[id number] Patient Account Number: [id number] Date of Birth/Sex: Treating RN: Jan 29, 1951 (68 y.o. F) Dwiggins, Shannon Primary Care Devota Viruet: [name] Other Clinician: Referring Ubah Radke: Treating Crystina Borrayo/Extender:Stone III, Sharen Hint, MARY Weeks in Treatment: 400 Visit Information History Since Last Visit Added or deleted any medications: Yes Patient Arrived: Ambulatory Any new allergies or adverse reactions: No Arrival Time: 09:28 Had a fall or experienced change in No Accompanied By: husband activities of daily living that may affect Transfer Assistance: None risk of falls: Patient Identification Verified: Yes Signs or symptoms of abuse/neglect since last No Secondary Verification Process Yes visito Completed: Hospitalized since last visit: Yes Patient Requires Transmission-Based No Implantable device outside of the clinic excluding No Precautions: cellular tissue based products placed in the center Patient Has Alerts: No since last visit: Has Dressing in Place as Prescribed: Yes Pain Present Now: No Electronic Signature(s) Signed: 11/23/2019 2:17:56 PM By: Kela Millin Entered By: Kela Millin on 11/23/2019 09:30:38 -------------------------------------------------------------------------------- Encounter Discharge Information Details Patient Name: Date of Service: [name] [name]. 11/23/2019 9:00 AM Medical Record Number:[id number] Patient Account Number: [id number] Date of Birth/Sex: Treating RN: December 13, 1951 (68 y.o. [name] Primary Care Osman Calzadilla: [name] Other Clinician: Referring Graceann Boileau: Treating Tameshia Bonneville/Extender:Stone III, Sharen Hint, MARY Weeks in Treatment: 400 Encounter Discharge Information Items Post Procedure Vitals Discharge Condition:  Stable Temperature (F): 98.3 Ambulatory Status: Ambulatory Pulse (bpm): 75 Discharge Destination: Home Respiratory Rate (breaths/min): 18 Transportation: Private Auto Blood Pressure (mmHg): 143/46 Accompanied By: husband Schedule Follow-up Appointment: Yes Clinical Summary of Care: Patient Declined Electronic Signature(s) Signed: 11/23/2019 2:17:56 PM By: Kela Millin Entered By: Kela Millin on 11/23/2019 10:15:00 -------------------------------------------------------------------------------- Lower Extremity Assessment Details Patient Name: Date of Service: [name] [name], [name] [name] 11/23/2019 9:00 AM Medical Record Number:[id number] Patient Account Number: [id number] Date of Birth/Sex: Treating RN: Dec 07, 1951 (67 y.o. [name] Primary Care Annalyssa Thune: [name] Other Clinician: Referring Edenilson Austad: Treating Deakin Lacek/Extender:Stone III, Sharen Hint, MARY Weeks in Treatment: 400 Edema Assessment Assessed: [Left: No] [Right: No] Edema: [Left: Ye] [Right: s] Calf Left: Right: Point of Measurement: 36 cm From Medial Instep cm 30.3 cm Ankle Left: Right: Point of Measurement: 10 cm From Medial Instep cm 20.1 cm Vascular Assessment Pulses: Dorsalis Pedis Palpable: [Right:Yes] Electronic Signature(s) Signed: 11/23/2019 2:17:56 PM By: Kela Millin Entered By: Kela Millin on 11/23/2019 09:34:58 -------------------------------------------------------------------------------- Multi-Disciplinary Care Plan Details Patient Name: Date of Service: [name] [name]. 11/23/2019 9:00 AM Medical Record Number:[id number] Patient Account Number: [id number] Date of Birth/Sex: Treating RN: 01/22/1951 (68 y.o. [name] Primary Care Janijah Symons: [name] Other Clinician: Referring Maurico Perrell: Treating Cael Worth/Extender:Stone III, Sharen Hint, MARY Weeks in Treatment: 400 Active Inactive Wound/Skin Impairment Nursing Diagnoses: Impaired tissue  integrity Knowledge deficit related to ulceration/compromised skin integrity Goals: Patient/caregiver will verbalize understanding of skin care regimen Date Initiated: 10/11/2016 Target Resolution Date: 12/25/2019 Goal Status: Active Interventions: Assess patient/caregiver ability to obtain necessary supplies Assess patient/caregiver ability to perform ulcer/skin care regimen upon admission and as needed Assess ulceration(s) every visit Provide education on ulcer and skin care Treatment Activities: Skin care regimen initiated : 10/11/2016 Topical wound management initiated : 10/11/2016 Notes: Electronic Signature(s) Signed: 11/23/2019 2:18:43 PM By: Levan Hurst RN, BSN Entered By: Levan Hurst on 11/23/2019 09:53:10 -------------------------------------------------------------------------------- Pain Assessment Details Patient Name: Date of Service: [name] [name] 11/23/2019 9:00 AM Medical Record Number:[id number] Patient Account Number: [id number] Date of Birth/Sex: Treating RN: [birth year] (  68 y.o. [name] Primary Care Kelsey Edman: [name] Other Clinician: Referring Nathanial Arrighi: Treating Knute Mazzuca/Extender:Stone III, Sharen Hint, MARY Weeks in Treatment: 400 Active Problems Location of Pain Severity and Description of Pain Patient Has Paino No Site Locations Pain Management and Medication Current Pain Management: Electronic Signature(s) Signed: 11/23/2019 2:17:56 PM By: Kela Millin Entered By: Kela Millin on 11/23/2019 09:32:14 -------------------------------------------------------------------------------- Patient/Caregiver Education Details Patient Name: Date of Service: [name] [name], [name] F. 11/23/2020andnbsp9:00 AM Medical Record Patient Account Number: [id number] Number: Treating RN: Levan Hurst Date of Birth/Gender: 1951/02/19 (68 y.o. Other Clinician: F) Treating Worthy Keeler Primary Care Physician: [name] Physician/Extender: Referring Physician: Assunta Gambles in Treatment: 400 Education Assessment Education Provided To: Patient Education Topics Provided Wound/Skin Impairment: Methods: Explain/Verbal Responses: State content correctly Electronic Signature(s) Signed: 11/23/2019 2:18:43 PM By: Levan Hurst RN, BSN Entered By: Levan Hurst on 11/23/2019 09:54:29 -------------------------------------------------------------------------------- Wound Assessment Details Patient Name: Date of Service: [name] [name] 11/23/2019 9:00 AM Medical Record Number:[id number] Patient Account Number: [id number] Date of Birth/Sex: Treating RN: Dec 30, 1951 (68 y.o. [name] Primary Care Kiah Keay: [name] Other Clinician: Referring Lory Nowaczyk: Treating Kalliope Riesen/Extender:Stone III, Sharen Hint, MARY Weeks in Treatment: 400 Wound Status Wound Number: 14 Primary Venous Leg Ulcer Etiology: Wound Location: Right Lower Leg - Medial, Anterior Wound Healed - Epithelialized Wounding Event: Gradually Appeared Status: Date Acquired: 11/03/2018 Comorbid Anemia, Hypertension, Peripheral Arterial Weeks Of Treatment: 55 History: Disease, Peripheral Venous Disease, Clustered Wound: No Raynauds, Scleroderma, Rheumatoid Arthritis, Osteoarthritis Wound Measurements Length: (cm) 0 % Reduct Width: (cm) 0 % Reduct Depth: (cm) 0 Epitheli Area: (cm) 0 Tunneli Volume: (cm) 0 Undermi Wound Description Classification: Full Thickness Without Exposed Support Structures Wound Distinct, outline attached Margin: Exudate None Present Amount: Wound Bed Granulation Amount: None Present (0%) Necrotic Amount: None Present (0%) Foul Odor After Cleansing: No Slough/Fibrino No Exposed Structure Fascia Exposed: No Fat Layer (Subcutaneous Tissue) Exposed: No Tendon Exposed: No Muscle Exposed: No Joint Exposed: No Bone Exposed: No ion in Area: 100% ion in Volume: 100% alization:  Large (67-100%) ng: No ning: No Electronic Signature(s) Signed: 11/23/2019 2:17:56 PM By: Kela Millin Signed: 11/23/2019 2:18:43 PM By: Levan Hurst RN, BSN Entered By: Levan Hurst on 11/23/2019 10:00:33 -------------------------------------------------------------------------------- Wound Assessment Details Patient Name: Date of Service: [name] [name] 11/23/2019 9:00 AM Medical Record Number:[id number] Patient Account Number: [id number] Date of Birth/Sex: Treating RN: Jun 07, 1951 (68 y.o. [name] Primary Care Gentri Guardado: [name] Other Clinician: Referring Madhuri Vacca: Treating Joselynne Killam/Extender:Stone III, Sharen Hint, MARY Weeks in Treatment: 400 Wound Status Wound Number: 5 Primary Venous Leg Ulcer Etiology: Wound Location: Right Lower Leg - Medial Wound Open Wounding Event: Gradually Appeared Status: Date Acquired: 01/13/2013 Comorbid Anemia, Hypertension, Peripheral Arterial Weeks Of Treatment: 356 History: Disease, Peripheral Venous Disease, Clustered Wound: Yes Raynauds, Scleroderma, Rheumatoid Arthritis, Osteoarthritis Wound Measurements Length: (cm) 1.2 % Reduction Width: (cm) 0.9 % Reduction Depth: (cm) 0.9 Epitheliali Area: (cm) 0.848 Tunneling: Volume: (cm) 0.763 Underminin Wound Description Full Thickness With Exposed Support Foul Odor Classification: Structures Slough/Fib Wound Well defined, not attached Margin: Exudate Medium Amount: Exudate Type: Purulent Exudate yellow, brown, green Color: Wound Bed Granulation Amount: None Present (0%) Necrotic Amount: Large (67-100%) Fascia Exp Necrotic Quality: Adherent Slough Fat Layer Tendon Exp Muscle Exp Joint Expo Bone Expos After Cleansing: No rino Yes Exposed Structure osed: No (Subcutaneous Tissue) Exposed: No osed: No osed: No sed: No ed: No in Area: 40% in Volume: -441.1% zation: Small (1-33%) No g: No Treatment Notes Wound #5 (Right,  Medial Lower  Leg) 1. Cleanse With Wound Cleanser Soap and water 2. Periwound Care Moisturizing lotion 3. Primary Dressing Applied Hydrofera Blue 4. Secondary Dressing Dry Gauze 6. Support Layer Holiday representative) Signed: 11/23/2019 2:17:56 PM By: Kela Millin Entered By: Kela Millin on 11/23/2019 09:39:52 -------------------------------------------------------------------------------- Wound Assessment Details Patient Name: Date of Service: [name] [name], [name] [name] 11/23/2019 9:00 AM Medical Record Number:[id number] Patient Account Number: [id number] Date of Birth/Sex: Treating RN: 03/29/1951 (68 y.o. [name] Primary Care Emmalie Haigh: [name] Other Clinician: Referring Siboney Requejo: Treating Shannen [place]/Extender:Stone III, Sharen Hint, MARY Weeks in Treatment: 400 Wound Status Wound Number: 7 Primary Venous Leg Ulcer Etiology: Wound Location: Right Malleolus - Lateral Wound Open Wounding Event: Gradually Appeared Status: Date Acquired: 06/25/2016 Comorbid Anemia, Hypertension, Peripheral Arterial Weeks Of Treatment: 176 History: Disease, Peripheral Venous Disease, Clustered Wound: No Raynauds, Scleroderma, Rheumatoid Arthritis, Osteoarthritis Wound Measurements Length: (cm) 1.2 % Reduct Width: (cm) 0.6 % Reduct Depth: (cm) 0.3 Epitheli Area: (cm) 0.565 Tunneli Volume: (cm) 0.17 Undermi Wound Description Classification: Full Thickness Without Exposed Support Structures Wound Well defined, not attached Margin: Exudate Medium Amount: Exudate Purulent Type: Exudate yellow, brown, green Color: Wound Bed Granulation Amount: None Present (0%) Necrotic Amount: Large (67-100%) Necrotic Quality: Adherent Slough Treatment Notes Wound #7 (Right, Lateral Malleolus) 1. Cleanse With Wound Cleanser Soap and water 2. Periwound Care Moisturizing lotion 3. Primary Dressing Applied Hydrofera Blue 4. Secondary Dressing Dry Gauze 6. Support  Layer Applied Kerlix/Coban Foul Odor After Cleansing: No Slough/Fibrino Yes Exposed Structure Fascia Exposed: No Fat Layer (Subcutaneous Tissue) Exposed: No Tendon Exposed: No Muscle Exposed: No Joint Exposed: No Bone Exposed: No ion in Area: 77.9% ion in Volume: 33.3% alization: None ng: No ning: No Electronic Signature(s) Signed: 11/23/2019 2:17:56 PM By: Kela Millin Entered By: Kela Millin on 11/23/2019 09:42:15 -------------------------------------------------------------------------------- Vitals Details Patient Name: Date of Service: [name] [name]. 11/23/2019 9:00 AM Medical Record Number:[id number] Patient Account Number: [id number] Date of Birth/Sex: Treating RN: 02-24-1951 (68 y.o. [name] Primary Care Taleshia Luff: [name] Other Clinician: Referring Andranik Jeune: Treating Nieshia Larmon/Extender:Stone III, Sharen Hint, MARY Weeks in Treatment: 400 Vital Signs Time Taken: 09:30 Temperature (F): 98.3 Height (in): 68 Pulse (bpm): 75 Weight (lbs): 132 Respiratory Rate (breaths/min): 18 Body Mass Index (BMI): 20.1 Blood Pressure (mmHg): 143/46 Reference Range: 80 - 120 mg / dl Electronic Signature(s) Signed: 11/23/2019 2:17:56 PM By: Kela Millin Entered By: Kela Millin on 11/23/2019 09:31:53

## 2019-11-23 NOTE — ED Notes (Signed)
Dinner Tray Ordered @ [phone].

## 2019-11-23 NOTE — ED Provider Notes (Signed)
[facility] EMERGENCY DEPARTMENT Provider Note   CSN: [id number] Arrival date & time: 11/23/19  1313     History   Chief Complaint Chief Complaint  Patient presents with   Loss of Consciousness   Fall    HPI [name] [name] is a 68 y.o. female presenting for evaluation after a fall.  Patient states she does not remember the fall specifically.  She remembers going to the wound center and coming back home today.  She states she was having intermittent dizziness, which is similar to how she felt last week when she was admitted.  Patient states she is admitted for electrolyte abnormalities.  She started to have diarrhea last night, but took Imodium today to help with this.  She denies dizziness currently.  Patient states the next thing she know she was with EMS coming to the hospital.  She reports pain of her left hip and left wrist.  She denies headache, vision changes, slurred speech, neck pain, back pain, chest pain, shortness of breath, nausea, vomiting, abdominal pain, urinary symptoms.  Denies recent fevers or chills.  She is on Plavix, has taken it today as prescribed.  Additional history obtained from triage note.  Husband told EMS that he witnessed her trip and fall.  Patient lost consciousness for about a minute before regaining consciousness and being alert and oriented.  Patient has not ambulated since the fall.  Additional history obtained from chart review.  Reviewed admission from last week due to electrolyte abnormalities.  Additionally, patient with a history of hypertension, hypothyroidism, chronic leg ulcer, non-Hodgkin's lymphoma status post chemo in remission, PAD, scleroderma, and chronic O2 use.     HPI  Past Medical History:  Diagnosis Date   Anemia    Arthritis    DOE (dyspnea on exertion)    Epistaxis    History of chicken pox    Hypertension    Hypothyroidism    Leg ulcer ([place])    Melanoma ([place]) 1999   Neuropathic foot ulcer  ([place])    Non Hodgkin's lymphoma ([place])    Treated with chemo 2010, felt to be cured.   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[facility])     Patient Active Problem List   Diagnosis Date Noted   AMS (altered mental status) 11/12/2019   Altered mental status 11/11/2019   Hypomagnesemia 11/11/2019   Osteoporosis 01/22/2019   Hypothyroidism 06/16/2018   Pulmonary hypertension associated with systemic disorder ([place]) 10/08/2017   Dry gangrene ([place]) 09/11/2017   Medication reaction 09/11/2017   ILD (interstitial lung disease) ([place]) 07/22/2017   Chronic respiratory failure ([place]) 10/31/2016   Atherosclerosis of native arteries of right leg with ulceration of unspecified site ([place]) 09/14/2016   Prolonged Q-T interval on ECG 07/29/2016   CKD (chronic kidney disease), stage III 07/29/2016   Scleroderma ([place]) 05/06/2016   Depression 04/24/2016   Hypokalemia 10/07/2015   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[place]) 09/01/2015   Allergic rhinitis 03/08/2015   Varicose veins of lower extremities with ulcer and inflammation ([place]) 07/24/2012   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[place]) 04/08/2012   Pulmonary hypertension associated with systemic disorder ([place]) 03/25/2012   Peripheral arterial occlusive disease ([place]) 08/10/2011   Essential hypertension 08/10/2011   Hyperlipidemia 08/10/2011    Past Surgical History:  Procedure  Laterality Date   AMPUTATION Right 10/24/2017   Procedure: RAY resection right index;  Surgeon: Daryll Brod, MD;  Location: [place];  Service: Orthopedics;  Laterality: Right;  Axillary block   AMPUTATION Right 11/21/2018   Procedure: AMPUTATION RAY RIGHT MIDDLE FINGER;  Surgeon: Daryll Brod, MD;  Location: [place];  Service: Orthopedics;  Laterality:  Right;   AMPUTATION Right 10/22/2019   Procedure: AMPUTATION DIGIT RIGHT RING AND SMALL FINGER, DIGITAL SYMPATHECTOMY RIGHT RADIAL ULNAR ARCH;  Surgeon: Daryll Brod, MD;  Location: [place];  Service: Orthopedics;  Laterality: Right;  AXILARY   apligraph  12-04-2102,   12-18-2012   bilateral calves   [name]   biopsy on cerebellum     COLONOSCOPY WITH PROPOFOL Left 10/07/2015   Procedure: COLONOSCOPY WITH PROPOFOL;  Surgeon: [name], MD;  Location: [place];  Service: Endoscopy;  Laterality: Left;   ENDARTERECTOMY FEMORAL Right 09/14/2016   Procedure: ENDARTERECTOMY FEMORAL RIGHT;  Surgeon: [name], MD;  Location: [facility] OR;  Service: Vascular;  Laterality: Right;   ENDOVENOUS ABLATION SAPHENOUS VEIN W/ LASER  07-24-2012   right greater saphenous vein by [name], M.D.    ENDOVENOUS ABLATION SAPHENOUS VEIN W/ LASER  10-16-2012   left greater saphenous vein  by Dr. [name]   ENDOVENOUS ABLATION SAPHENOUS VEIN W/ LASER Right 09-01-2015   endovenous laser ablation right greater saphenous vein by [name] MD   ESOPHAGOGASTRODUODENOSCOPY N/A 05/07/2016   Procedure: ESOPHAGOGASTRODUODENOSCOPY (EGD);  Surgeon: [name], MD;  Location: [facility] ENDOSCOPY;  Service: Endoscopy;  Laterality: N/A;   ESOPHAGOGASTRODUODENOSCOPY (EGD) WITH PROPOFOL Left 10/07/2015   Procedure: ESOPHAGOGASTRODUODENOSCOPY (EGD) WITH PROPOFOL;  Surgeon: [name], MD;  Location: [place];  Service: Endoscopy;  Laterality: Left;   GIVENS CAPSULE STUDY N/A 07/31/2016   Procedure: GIVENS CAPSULE STUDY;  Surgeon: [name], MD;  Location: [place];  Service: Endoscopy;  Laterality: N/A;   melonoma removes Right    behind right knee   PATCH ANGIOPLASTY Right 09/14/2016   Procedure: PATCH ANGIOPLASTY RIGHT FEMORAL ARTERY USING HEMASHIELD PLATINUM FINESSE PATCH;  Surgeon: [name], MD;  Location: [place];  Service: Vascular;  Laterality: Right;   PERIPHERAL VASCULAR CATHETERIZATION  N/A 09/07/2016   Procedure: Abdominal Aortogram w/Lower Extremity;  Surgeon: [name], MD;  Location: [place] CV LAB;  Service: Cardiovascular;  Laterality: N/A;   RIGHT HEART CATH N/A 04/19/2017   Procedure: Right Heart Cath;  Surgeon: [name], MD;  Location: [place] CV LAB;  Service: Cardiovascular;  Laterality: N/A;   RIGHT HEART CATHETERIZATION N/A 11/14/2012   Procedure: RIGHT HEART CATH;  Surgeon: [name], MD;  Location: [facility] CATH LAB;  Service: Cardiovascular;  Laterality: N/A;   rt little toe removal  10/1998   stent left thigh  3/11   TENDON TRANSFER Right 10/24/2017   Procedure: transfer extensor indecus propius/extensor digitorum commomus index to middle ring and small;  Surgeon: Daryll Brod, MD;  Location: [place];  Service: Orthopedics;  Laterality: Right;   TONSILLECTOMY     TONSILLECTOMY AND ADENOIDECTOMY     TUNNELED VENOUS CATHETER PLACEMENT  01/2013   ULNAR HEAD EXCISION Right 10/24/2017   Procedure: darrach right ulna;  Surgeon: Daryll Brod, MD;  Location: [place];  Service: Orthopedics;  Laterality: Right;   VEIN LIGATION AND STRIPPING  05/16/2012   Procedure: VEIN LIGATION AND STRIPPING;  Surgeon: [name], MD;  Location: [place];  Service: Vascular;  Laterality: Right;  Irrigation and Debridement right lower leg, ligation of saphenous vein.     OB History   No obstetric history on file.      Home Medications    Prior to Admission medications   Medication Sig Start Date End Date Taking? Authorizing Provider  acetaminophen (TYLENOL ARTHRITIS PAIN) 650 MG CR tablet Take 1,300 mg by mouth every 8 (eight) hours.     [provider]  atorvastatin (LIPITOR) 20 MG tablet Take 1 tablet (20 mg total) by mouth daily. 06/18/19   [name], MD  Biotin 5000 MCG TABS Take 5,000 mcg by mouth daily.    [provider]  CALCIUM-MAGNESIUM-VITAMIN D PO Take 2 tablets by mouth daily.    [provider]    Cholecalciferol (VITAMIN D) 2000 units CAPS Take 2,000 Units by mouth daily.    [provider]  citalopram (CELEXA) 20 MG tablet TAKE 1 TABLET BY MOUTH EVERYDAY AT BEDTIME Patient taking differently: Take 20 mg by mouth daily.  07/11/19   [name], MD  clopidogrel (PLAVIX) 75 MG tablet TAKE 1 TABLET BY MOUTH EVERY DAY Patient taking differently: Take 75 mg by mouth daily.  03/11/19   Larey Dresser, MD  diphenoxylate-atropine (LOMOTIL) 2.5-0.025 MG tablet Take 1 tablet by mouth 4 (four) times daily as needed for diarrhea or loose stools. 11/15/19   Osei-Bonsu, Iona Beard, MD  FERREX 150 150 MG capsule TAKE ONE CAPSULE BY MOUTH TWICE A DAY Patient taking differently: Take 150 mg by mouth 2 (two) times daily.  01/26/19   [name], MD  fish oil-omega-3 fatty acids 1000 MG capsule Take 1,000 mg by mouth 2 (two) times daily.     [provider]  gabapentin (NEURONTIN) 300 MG capsule Take 1 capsule (300 mg total) by mouth at bedtime. 11/15/19   Benito Mccreedy, MD  HYDROcodone-acetaminophen (NORCO) 5-325 MG tablet Take 1 tablet by mouth every 6 (six) hours as needed for moderate pain. 10/22/19   Daryll Brod, MD  levothyroxine (SYNTHROID) 50 MCG tablet TAKE 1 TABLET BY MOUTH DAILY BEFORE BREAKFAST Patient taking differently: Take 50 mcg by mouth daily before breakfast.  09/09/19   Baxley, Cresenciano Lick, MD  magnesium oxide (MAG-OX) 400 (241.3 Mg) MG tablet Take 0.5 tablets (200 mg total) by mouth 2 (two) times daily. 11/15/19   Benito Mccreedy, MD  Multiple Vitamin (MULTIVITAMIN WITH MINERALS) TABS tablet Take 1 tablet by mouth daily.    [provider]  nystatin (MYCOSTATIN) 100000 UNIT/ML suspension TAKE 5 MLS (500,000 UNITS TOTAL) BY MOUTH 4 (FOUR) TIMES DAILY. 11/22/19   [name], MD  ondansetron (ZOFRAN ODT) 4 MG disintegrating tablet Take 1 tablet (4 mg total) by mouth every 8 (eight) hours as needed for nausea or vomiting. 11/08/19   Virgel Manifold, MD  OPSUMIT  10 MG tablet Take 1 tablet (75m) by mouth daily. 04/10/19   Bensimhon, DShaune Pascal MD  OXYGEN Inhale 2-3 L into the lungs continuous.     [provider]  pantoprazole (PROTONIX) 40 MG tablet Take 1 tablet (40 mg total) by mouth 2 (two) times daily. 05/08/16   DRobbie Lis MD  Probiotic CAPS Take 1 capsule by mouth daily.    [provider]  RESTASIS 0.05 % ophthalmic emulsion Place 1 drop into both eyes 2 (two) times daily. 08/28/19   [provider]  saccharomyces boulardii (FLORASTOR) 250 MG capsule Take 1 capsule (250 mg total) by mouth 2 (two) times daily. 11/15/19  Benito Mccreedy, MD  valACYclovir (VALTREX) 500 MG tablet Take 1 tablet (500 mg total) by mouth 2 (two) times daily. Patient taking differently: Take 500 mg by mouth 2 (two) times daily as needed (cold sores).  09/02/19   [name], MD  vitamin B-12 (CYANOCOBALAMIN) 1000 MCG tablet Take 1,000 mcg by mouth daily.    [provider]    Family History Family History  Problem Relation Age of Onset   Lupus Father    Lymphoma Father    Hyperlipidemia Mother    Cancer Daughter        sarcoma   Colon cancer Other     Social History Social History   Tobacco Use   Smoking status: Never Smoker   Smokeless tobacco: Never Used  Substance Use Topics   Alcohol use: Yes    Alcohol/week: 0.0 standard drinks    Comment: 1-2 drinks per day wine.liquor or beer   Drug use: No     Allergies   Levofloxacin, Sulfa antibiotics, Levofloxacin in d5w, Doxycycline hyclate, and Penicillins   Review of Systems Review of Systems  Musculoskeletal: Positive for arthralgias.  Hematological: Bruises/bleeds easily.  All other systems reviewed and are negative.    Physical Exam Updated Vital Signs BP (!) 123/57 (BP Location: Right Arm)    Pulse 73    Temp 98.2 F (36.8 C) (Oral)    Resp 15    Ht _0  (1.727 m)    Wt 61.7 kg    SpO2 100%    BMI 20.68 kg/m   Physical Exam Vitals signs  and nursing note reviewed.  Constitutional:      General: She is not in acute distress.    Appearance: She is well-developed.     Comments: Resting comfortably in the bed in no acute distress  HENT:     Head: Normocephalic.      Comments: Hematoma to the right temple with skin tear.  No suturable laceration.  No hemotympanum or nasal septal hematoma.  No injury noted elsewhere.  No tenderness palpation of the zygoma or orbit. Eyes:     Extraocular Movements: Extraocular movements intact.     Conjunctiva/sclera: Conjunctivae normal.     Pupils: Pupils are equal, round, and reactive to light.     Comments: EOMI and PERRLA without nystagmus or entrapment.  Neck:     Musculoskeletal: Normal range of motion and neck supple.     Comments: No tenderness palpation midline C-spine.  Full active range of motion of the head without pain. Cardiovascular:     Rate and Rhythm: Normal rate and regular rhythm.     Pulses: Normal pulses.  Pulmonary:     Effort: Pulmonary effort is normal. No respiratory distress.     Breath sounds: Normal breath sounds. No wheezing.     Comments: Speaking full sentences.  Clear lung sounds in all fields.  No signs of bruising or trauma. Abdominal:     General: There is no distension.     Palpations: Abdomen is soft. There is no mass.     Tenderness: There is no abdominal tenderness. There is no guarding or rebound.  Musculoskeletal:        General: Tenderness present.     Comments: Tenderness palpation of left hip.  No shortening or rotation of the left leg.  Tenderness palpation of the distal radius of the left wrist.  No obvious deformity.  Radial pulses intact.  No tenderness palpation of the hand, forearm, or  elbow.  No tenderness palpation of the shoulder, although bruise noted of the anterior humerus.  Patient with skin tear of her shoulder just distal to her neck on the left side.  No suturable laceration.  No tenderness palpation of back or midline spine.  No  tenderness palpation of the lower legs.  Skin:    General: Skin is warm and dry.     Capillary Refill: Capillary refill takes less than 2 seconds.  Neurological:     Mental Status: She is alert and oriented to person, place, and time.      ED Treatments / Results  Labs (all labs ordered are listed, but only abnormal results are displayed) Labs Reviewed  CBC WITH DIFFERENTIAL/PLATELET  URINALYSIS, ROUTINE W REFLEX MICROSCOPIC  COMPREHENSIVE METABOLIC PANEL  MAGNESIUM  PHOSPHORUS  TROPONIN I (HIGH SENSITIVITY)    EKG None  Radiology No results found.  Procedures Procedures (including critical care time)  Medications Ordered in ED Medications  bacitracin ointment (has no administration in time range)     Initial Impression / Assessment and Plan / ED Course  I have reviewed the triage vital signs and the nursing notes.  Pertinent labs & imaging results that were available during my care of the patient were reviewed by me and considered in my medical decision making (see chart for details).        Pt presenting for evaluation after fall.  Physical examination, she appears nontoxic.  As she fell and hit her head and is on blood thinners, will obtain CT scan.  Per husband, this appears to be a mechanical fall.  However due to dizziness and recent hospitalization, will also obtain blood work.  Will obtain x-rays of the shoulder, wrist, and hip for further evaluation.  Case discussed with attending, Dr. Langston Masker agrees to plan.  Labs show improving hemoglobin.  Remaining labs and images pending.  Pt signed out to Joaquin Courts, PA-C for f/u on labs and imaging. If normal, pt can likely be ambulated and d/c.   Final Clinical Impressions(s) / ED Diagnoses   Final diagnoses:  None    ED Discharge Orders    None       Franchot Heidelberg, PA-C 11/23/19 1530    Wyvonnia Dusky, MD 11/23/19 1537

## 2019-11-23 NOTE — ED Provider Notes (Addendum)
Received patient as a handoff from [employer], PA-C.   Patient presents to the ED subsequent to sustaining hospital described a witnessed fall.  Her husband reported to EMS that it was a mechanical fall and that there was no preceding syncopal event.  Patient however does not fully recollect the event and reports that she has been having intermittent dizziness.  She reportedly lost consciousness briefly before becoming alert and oriented with no obvious postictal confusion.  No history of seizures.  Her chief complaints are left hip pain and left wrist discomfort.  Patient is on Plavix.  Physical Exam  BP (!) 131/56   Pulse 71   Temp 98.2 F (36.8 C) (Oral)   Resp 19   Ht _0  (1.727 m)   Wt 61.7 kg   SpO2 97%   BMI 20.68 kg/m   Physical Exam Vitals signs and nursing note reviewed. Exam conducted with a chaperone present.  Constitutional:      Appearance: Normal appearance.  HENT:     Head: Normocephalic.     Comments: Left temporal region hematoma.  No raccoon eyes, battle sign, or hemotympanum concerning for basilar skull fracture. Eyes:     General: No scleral icterus.    Conjunctiva/sclera: Conjunctivae normal.  Neck:     Musculoskeletal: Normal range of motion and neck supple. No neck rigidity.  Cardiovascular:     Rate and Rhythm: Normal rate and regular rhythm.     Pulses: Normal pulses.     Heart sounds: Normal heart sounds.  Pulmonary:     Effort: Pulmonary effort is normal. No respiratory distress.     Breath sounds: Normal breath sounds.  Musculoskeletal:     Comments: Left-sided scapular skin tear.  ROM intact.  No bony TTP. Ecchymoses over left shoulder.  Mildly TTP. Deformity and swelling noted left distal radius.  Limited ROM.  Pulses intact.  Neurovascular intact.  Cap refill intact. No significant swelling or ecchymoses over left lateral hip.  ROM not assessed until plain films obtained.  Skin:    General: Skin is dry.  Neurological:     Mental  Status: She is alert and oriented to person, place, and time.     GCS: GCS eye subscore is 4. GCS verbal subscore is 5. GCS motor subscore is 6.  Psychiatric:        Mood and Affect: Mood normal.        Behavior: Behavior normal.        Thought Content: Thought content normal.     ED Course/Procedures     .Critical Care Performed by: Corena Herter, PA-C Authorized by: Corena Herter, PA-C   Critical care provider statement:    Critical care time (minutes):  30   Critical care was time spent personally by me on the following activities:  Discussions with consultants, evaluation of patient's response to treatment, examination of patient, ordering and performing treatments and interventions, ordering and review of laboratory studies, ordering and review of radiographic studies, pulse oximetry, re-evaluation of patient's condition, obtaining history from patient or surrogate and review of old charts    CT Head Wo contrast: IMPRESSION:  No acute intracranial abnormality. Chronic findings as detailed  above, not significantly changed from recent prior CT.   DG Hip Unilat W or Wo Pelvis 2-3 views Left: IMPRESSION:  Acute displaced fractures involving the left superior and inferior  pubic rami.   DG Wrist Complete Left: IMPRESSION:  1. Acute impacted and mildly displaced distal radius  fracture  2. Radial subluxation base of the first metacarpal of uncertain  chronicity   DG left shoulder: IMPRESSION:  No acute osseous abnormality.     MDM   Clinical course: DG left wrist demonstrates no acute impacted mildly displaced distal radial fracture with radial subluxation of the first metacarpal.  DG left hip and pelvis demonstrate acute displaced fractures involving left superior and inferior pubic rami.  Patient is currently resting comfortably and declined analgesics.  CT head without contrast was obtained given patient's head injury, reported dizziness, left temporal hematoma,  and currently on Plavix.  There is no evidence of intracranial hemorrhage or other acute intracranial abnormalities.  I personally reviewed patient's past medical records. Upon taking over her care, her CBC demonstrated mild anemia, but above patient's usual baseline.  Her vitals are reassuring and she is hemodynamically stable.  CMP demonstrated calcium of 8.0 which is improved from her recent admission to the hospital for electrolyte maladies.  She also demonstrates hypokalemia to 3.2 and will receive 40 mEq K-Dur and she was mildly hyponatremic to 133.  Renal function is mildly improved from her recent admission.  Her magnesium is critically low at 0.8 which is not particularly unusual for her.  She will be provided 2 g magnesium sulfate.  Patient reports that she usually takes Imodium for her magnesium sulfate as it can cause profuse diarrhea, but she already has prolonged QT on EKG.  She continues to deny any significant pain or discomfort and does not require analgesics at this time.  Patient was admitted on 11/10/2019 and discharged on 11/15/2019 by [facility] for her electrolyte abnormalities, dehydration, and acute metabolic encephalopathy.  Will consult with them for admission for her displaced left hip fractures and continued electrolyte abnormalities.  We will first consult with orthopedics to make them aware of the patient.  Spoke with Delray Alt PA-C who works with Dr. Noemi Chapel, she will have Dr. Mardelle Matte who is on-call evaluate the patient while here in the ED. Placed order with hospitalist for admission.   5:12 PM Spoke with Dr. Tamala Julian with [facility] who will evaluate and admit the patient.        Corena Herter, PA-C 11/23/19 1708    Corena Herter, PA-C 11/23/19 1713    Virgel Manifold, MD 11/23/19 2213

## 2019-11-23 NOTE — ED Notes (Addendum)
Pt transported to CT

## 2019-11-23 NOTE — ED Notes (Signed)
Pt transported to XR.  

## 2019-11-23 NOTE — ED Triage Notes (Addendum)
Pt brought to ED from home via EMS after experiencing a fall with LOC. Pt's husband reported to EMS that the pt tripped and hit her head on the corner of a door and lost consciousness for around 1 minute. EMS reports the pt was A&O upon arrival to home but did not remember falling or losing consciousness. Hx of scleraderma, currently on Plavix. Pt is currently A&O x4, denies memory of fall but recalls experiencing dizziness this AM. Laceration to upper left shoulder and hematoma above left eye present. Pt c/o pain in left wrist and left hip.

## 2019-11-23 NOTE — Discharge Instructions (Addendum)
[facility] Discharge Instructions   General Discharge Instructions  Orthopaedic Injuries:  Left distal radius fracture treated with plates and screws (ORIF-open reduction and internal fixation)             Left pelvic ring fracture- treated non-operatively   WEIGHT BEARING STATUS: nonweightbearing left wrist, ok to weightbear through left elbow with a platform walker. weightbear as tolerated Both legs  RANGE OF MOTION/ACTIVITY: activity as tolerated while maintaining weightbearing restrictions. Do not remove splint left wrist, ok to move fingers, elbow and shoulder   Bone health:  Continue taking vitamin d and calcium. Recommend getting a new DEXA scan in the next 4-8 weeks   Wound Care: keep splint clean and dry. Do not remove splint. We will remove at your first post operative appointment    Diet: as you were eating previously.  Can use over the counter stool softeners and bowel preparations, such as Miralax, to help with bowel movements.  Narcotics can be constipating.  Be sure to drink plenty of fluids  PAIN MEDICATION USE AND EXPECTATIONS  You have likely been given narcotic medications to help control your pain.  After a traumatic event that results in an fracture (broken bone) with or without surgery, it is ok to use narcotic pain medications to help control one's pain.  We understand that everyone responds to pain differently and each individual patient will be evaluated on a regular basis for the continued need for narcotic medications. Ideally, narcotic medication use should last no more than 6-8 weeks (coinciding with fracture healing).   As a patient it is your responsibility as well to monitor narcotic medication use and report the amount and frequency you use these medications when you come to your office visit.   We would also advise that if you are using narcotic medications, you should take a dose prior to therapy to maximize you participation.  IF YOU  ARE ON NARCOTIC MEDICATIONS IT IS NOT PERMISSIBLE TO OPERATE A MOTOR VEHICLE (MOTORCYCLE/CAR/TRUCK/MOPED) OR HEAVY MACHINERY DO NOT MIX NARCOTICS WITH OTHER CNS (CENTRAL NERVOUS SYSTEM) DEPRESSANTS SUCH AS ALCOHOL   STOP SMOKING OR USING NICOTINE PRODUCTS!!!!  As discussed nicotine severely impairs your body's ability to heal surgical and traumatic wounds but also impairs bone healing.  Wounds and bone heal by forming microscopic blood vessels (angiogenesis) and nicotine is a vasoconstrictor (essentially, shrinks blood vessels).  Therefore, if vasoconstriction occurs to these microscopic blood vessels they essentially disappear and are unable to deliver necessary nutrients to the healing tissue.  This is one modifiable factor that you can do to dramatically increase your chances of healing your injury.    (This means no smoking, no nicotine gum, patches, etc)  DO NOT USE NONSTEROIDAL ANTI-INFLAMMATORY DRUGS (NSAID'S)  Using products such as Advil (ibuprofen), Aleve (naproxen), Motrin (ibuprofen) for additional pain control during fracture healing can delay and/or prevent the healing response.  If you would like to take over the counter (OTC) medication, Tylenol (acetaminophen) is ok.  However, some narcotic medications that are given for pain control contain acetaminophen as well. Therefore, you should not exceed more than 4000 mg of tylenol in a day if you do not have liver disease.  Also note that there are may OTC medicines, such as cold medicines and allergy medicines that my contain tylenol as well.  If you have any questions about medications and/or interactions please ask your doctor/PA or your pharmacist.      ICE AND ELEVATE INJURED/OPERATIVE EXTREMITY  Using ice and elevating the injured extremity above your heart can help with swelling and pain control.  Icing in a pulsatile fashion, such as 20 minutes on and 20 minutes off, can be followed.    Do not place ice directly on skin. Make sure  there is a barrier between to skin and the ice pack.    Using frozen items such as frozen peas works well as the conform nicely to the are that needs to be iced.  USE AN ACE WRAP OR TED HOSE FOR SWELLING CONTROL  In addition to icing and elevation, Ace wraps or TED hose are used to help limit and resolve swelling.  It is recommended to use Ace wraps or TED hose until you are informed to stop.    When using Ace Wraps start the wrapping distally (farthest away from the body) and wrap proximally (closer to the body)   Example: If you had surgery on your leg or thing and you do not have a splint on, start the ace wrap at the toes and work your way up to the thigh        If you had surgery on your upper extremity and do not have a splint on, start the ace wrap at your fingers and work your way up to the upper arm  IF YOU ARE IN A SPLINT OR CAST DO NOT [place]   If your splint gets wet for any reason please contact the office immediately. You may shower in your splint or cast as long as you keep it dry.  This can be done by wrapping in a cast cover or garbage back (or similar)  Do Not stick any thing down your splint or cast such as pencils, money, or hangers to try and scratch yourself with.  If you feel itchy take benadryl as prescribed on the bottle for itching  IF YOU ARE IN A CAM BOOT (BLACK BOOT)  You may remove boot periodically. Perform daily dressing changes as noted below.  Wash the liner of the boot regularly and wear a sock when wearing the boot. It is recommended that you sleep in the boot until told otherwise    Call office for the following:  Temperature greater than 101F  Persistent nausea and vomiting  Severe uncontrolled pain  Redness, tenderness, or signs of infection (pain, swelling, redness, odor or green/yellow discharge around the site)  Difficulty breathing, headache or visual disturbances  Hives  Persistent dizziness or light-headedness  Extreme  fatigue  Any other questions or concerns you may have after discharge  In an emergency, call 911 or go to an Emergency Department at a nearby hospital    [place]: [phone]   VISIT OUR WEBSITE FOR ADDITIONAL INFORMATION: [email or web address]

## 2019-11-24 ENCOUNTER — Inpatient Hospital Stay (HOSPITAL_COMMUNITY): Payer: Medicare Other

## 2019-11-24 ENCOUNTER — Other Ambulatory Visit: Payer: Medicare Other | Admitting: Internal Medicine

## 2019-11-24 ENCOUNTER — Inpatient Hospital Stay (HOSPITAL_COMMUNITY): Payer: Medicare Other | Admitting: Anesthesiology

## 2019-11-24 ENCOUNTER — Encounter (HOSPITAL_COMMUNITY)
Admission: EM | Disposition: A | Discharge status: Home Health | Payer: Self-pay | Source: Home / Self Care | Attending: Internal Medicine

## 2019-11-24 DIAGNOSIS — R9431 Abnormal electrocardiogram [ECG] [EKG]: Secondary | ICD-10-CM | POA: Diagnosis not present

## 2019-11-24 DIAGNOSIS — E039 Hypothyroidism, unspecified: Secondary | ICD-10-CM | POA: Diagnosis not present

## 2019-11-24 DIAGNOSIS — I10 Essential (primary) hypertension: Secondary | ICD-10-CM | POA: Diagnosis not present

## 2019-11-24 DIAGNOSIS — E785 Hyperlipidemia, unspecified: Secondary | ICD-10-CM | POA: Diagnosis not present

## 2019-11-24 DIAGNOSIS — N1831 Chronic kidney disease, stage 3a: Secondary | ICD-10-CM | POA: Diagnosis not present

## 2019-11-24 DIAGNOSIS — Z85820 Personal history of malignant melanoma of skin: Secondary | ICD-10-CM | POA: Diagnosis not present

## 2019-11-24 DIAGNOSIS — S52502A Unspecified fracture of the lower end of left radius, initial encounter for closed fracture: Secondary | ICD-10-CM | POA: Diagnosis not present

## 2019-11-24 DIAGNOSIS — L97509 Non-pressure chronic ulcer of other part of unspecified foot with unspecified severity: Secondary | ICD-10-CM | POA: Diagnosis present

## 2019-11-24 DIAGNOSIS — Z20828 Contact with and (suspected) exposure to other viral communicable diseases: Secondary | ICD-10-CM | POA: Diagnosis not present

## 2019-11-24 DIAGNOSIS — J9611 Chronic respiratory failure with hypoxia: Secondary | ICD-10-CM | POA: Diagnosis not present

## 2019-11-24 DIAGNOSIS — I2729 Other secondary pulmonary hypertension: Secondary | ICD-10-CM | POA: Diagnosis not present

## 2019-11-24 DIAGNOSIS — M810 Age-related osteoporosis without current pathological fracture: Secondary | ICD-10-CM | POA: Diagnosis present

## 2019-11-24 DIAGNOSIS — S32810A Multiple fractures of pelvis with stable disruption of pelvic ring, initial encounter for closed fracture: Secondary | ICD-10-CM | POA: Diagnosis not present

## 2019-11-24 DIAGNOSIS — W010XXA Fall on same level from slipping, tripping and stumbling without subsequent striking against object, initial encounter: Secondary | ICD-10-CM | POA: Diagnosis present

## 2019-11-24 DIAGNOSIS — S62109A Fracture of unspecified carpal bone, unspecified wrist, initial encounter for closed fracture: Secondary | ICD-10-CM | POA: Diagnosis present

## 2019-11-24 DIAGNOSIS — I2721 Secondary pulmonary arterial hypertension: Secondary | ICD-10-CM | POA: Diagnosis present

## 2019-11-24 DIAGNOSIS — S52509A Unspecified fracture of the lower end of unspecified radius, initial encounter for closed fracture: Secondary | ICD-10-CM | POA: Diagnosis not present

## 2019-11-24 DIAGNOSIS — Z9981 Dependence on supplemental oxygen: Secondary | ICD-10-CM | POA: Diagnosis not present

## 2019-11-24 DIAGNOSIS — M349 Systemic sclerosis, unspecified: Secondary | ICD-10-CM | POA: Diagnosis not present

## 2019-11-24 DIAGNOSIS — S32592A Other specified fracture of left pubis, initial encounter for closed fracture: Secondary | ICD-10-CM | POA: Diagnosis not present

## 2019-11-24 DIAGNOSIS — I739 Peripheral vascular disease, unspecified: Secondary | ICD-10-CM | POA: Diagnosis not present

## 2019-11-24 DIAGNOSIS — I129 Hypertensive chronic kidney disease with stage 1 through stage 4 chronic kidney disease, or unspecified chronic kidney disease: Secondary | ICD-10-CM | POA: Diagnosis not present

## 2019-11-24 DIAGNOSIS — F329 Major depressive disorder, single episode, unspecified: Secondary | ICD-10-CM | POA: Diagnosis not present

## 2019-11-24 DIAGNOSIS — S52502D Unspecified fracture of the lower end of left radius, subsequent encounter for closed fracture with routine healing: Secondary | ICD-10-CM | POA: Diagnosis not present

## 2019-11-24 DIAGNOSIS — E876 Hypokalemia: Secondary | ICD-10-CM | POA: Diagnosis not present

## 2019-11-24 DIAGNOSIS — D62 Acute posthemorrhagic anemia: Secondary | ICD-10-CM | POA: Diagnosis not present

## 2019-11-24 DIAGNOSIS — E8889 Other specified metabolic disorders: Secondary | ICD-10-CM | POA: Diagnosis present

## 2019-11-24 DIAGNOSIS — K219 Gastro-esophageal reflux disease without esophagitis: Secondary | ICD-10-CM | POA: Diagnosis present

## 2019-11-24 DIAGNOSIS — E871 Hypo-osmolality and hyponatremia: Secondary | ICD-10-CM | POA: Diagnosis not present

## 2019-11-24 DIAGNOSIS — G8389 Other specified paralytic syndromes: Secondary | ICD-10-CM | POA: Diagnosis present

## 2019-11-24 DIAGNOSIS — N183 Chronic kidney disease, stage 3 unspecified: Secondary | ICD-10-CM | POA: Diagnosis not present

## 2019-11-24 DIAGNOSIS — Y92009 Unspecified place in unspecified non-institutional (private) residence as the place of occurrence of the external cause: Secondary | ICD-10-CM | POA: Diagnosis not present

## 2019-11-24 DIAGNOSIS — D638 Anemia in other chronic diseases classified elsewhere: Secondary | ICD-10-CM | POA: Diagnosis not present

## 2019-11-24 HISTORY — PX: OPEN REDUCTION INTERNAL FIXATION (ORIF) DISTAL RADIAL FRACTURE: SHX5989

## 2019-11-24 LAB — CBC
HCT: 29.4 % — ABNORMAL LOW (ref 36.0–46.0)
Hemoglobin: 9.3 g/dL — ABNORMAL LOW (ref 12.0–15.0)
MCH: 32.7 pg (ref 26.0–34.0)
MCHC: 31.6 g/dL (ref 30.0–36.0)
MCV: 103.5 fL — ABNORMAL HIGH (ref 80.0–100.0)
Platelets: 308 10*3/uL (ref 150–400)
RBC: 2.84 MIL/uL — ABNORMAL LOW (ref 3.87–5.11)
RDW: 13.8 % (ref 11.5–15.5)
WBC: 7.7 10*3/uL (ref 4.0–10.5)
nRBC: 0 % (ref 0.0–0.2)

## 2019-11-24 LAB — BASIC METABOLIC PANEL
Anion gap: 12 (ref 5–15)
BUN: 20 mg/dL (ref 8–23)
CO2: 26 mmol/L (ref 22–32)
Calcium: 7.8 mg/dL — ABNORMAL LOW (ref 8.9–10.3)
Chloride: 97 mmol/L — ABNORMAL LOW (ref 98–111)
Creatinine, Ser: 1.24 mg/dL — ABNORMAL HIGH (ref 0.44–1.00)
GFR calc Af Amer: 51 mL/min — ABNORMAL LOW (ref >60)
GFR calc non Af Amer: 44 mL/min — ABNORMAL LOW (ref >60)
Glucose, Bld: 94 mg/dL (ref 70–99)
Potassium: 3.8 mmol/L (ref 3.5–5.1)
Sodium: 135 mmol/L (ref 135–145)

## 2019-11-24 LAB — URINALYSIS, ROUTINE W REFLEX MICROSCOPIC
Bilirubin Urine: NEGATIVE
Glucose, UA: NEGATIVE mg/dL
Hgb urine dipstick: NEGATIVE
Ketones, ur: NEGATIVE mg/dL
Leukocytes,Ua: NEGATIVE
Nitrite: NEGATIVE
Protein, ur: NEGATIVE mg/dL
Specific Gravity, Urine: 1.006 (ref 1.005–1.030)
pH: 5 (ref 5.0–8.0)

## 2019-11-24 LAB — MAGNESIUM: Magnesium: 1.6 mg/dL — ABNORMAL LOW (ref 1.7–2.4)

## 2019-11-24 SURGERY — OPEN REDUCTION INTERNAL FIXATION (ORIF) DISTAL RADIUS FRACTURE
Anesthesia: General | Site: Elbow | Laterality: Left

## 2019-11-24 MED ORDER — PHENYLEPHRINE HCL-NACL 10-0.9 MG/250ML-% IV SOLN
INTRAVENOUS | Status: DC | PRN
  Administered 2019-11-24: 50 ug/min via INTRAVENOUS
  Administered 2019-11-24: 100 ug/min via INTRAVENOUS

## 2019-11-24 MED ORDER — DEXAMETHASONE SODIUM PHOSPHATE 10 MG/ML IJ SOLN
INTRAMUSCULAR | Status: AC
  Filled 2019-11-24: qty 2

## 2019-11-24 MED ORDER — ACETAMINOPHEN 325 MG PO TABS: 325.0000 mg | ORAL_TABLET | ORAL | Status: DC | PRN

## 2019-11-24 MED ORDER — CLINDAMYCIN PHOSPHATE 600 MG/50ML IV SOLN
600.0000 mg | Freq: Three times a day (TID) | INTRAVENOUS | Status: AC
  Administered 2019-11-24 – 2019-11-25 (×2): 600 mg via INTRAVENOUS
  Filled 2019-11-24 (×2): qty 50

## 2019-11-24 MED ORDER — FENTANYL CITRATE (PF) 100 MCG/2ML IJ SOLN
100.0000 ug | Freq: Once | INTRAMUSCULAR | Status: AC
  Administered 2019-11-24: 15:00:00 100 ug via INTRAVENOUS

## 2019-11-24 MED ORDER — OXYCODONE HCL 5 MG PO TABS: 5.0000 mg | ORAL_TABLET | Freq: Once | ORAL | Status: DC | PRN

## 2019-11-24 MED ORDER — ROPIVACAINE HCL 7.5 MG/ML IJ SOLN
INTRAMUSCULAR | Status: DC | PRN
  Administered 2019-11-24: 20 mL via PERINEURAL

## 2019-11-24 MED ORDER — PROPOFOL 10 MG/ML IV BOLUS
INTRAVENOUS | Status: DC | PRN
  Administered 2019-11-24: 10 mg via INTRAVENOUS

## 2019-11-24 MED ORDER — MAGNESIUM SULFATE IN D5W 1-5 GM/100ML-% IV SOLN
1.0000 g | Freq: Once | INTRAVENOUS | Status: AC
  Administered 2019-11-24: 1 g via INTRAVENOUS
  Filled 2019-11-24: qty 100

## 2019-11-24 MED ORDER — THIAMINE HCL 100 MG/ML IJ SOLN
100.0000 mg | Freq: Every day | INTRAMUSCULAR | Status: DC
  Administered 2019-11-24 – 2019-11-25 (×2): 100 mg via INTRAVENOUS
  Filled 2019-11-24 (×2): qty 2

## 2019-11-24 MED ORDER — PROPOFOL 500 MG/50ML IV EMUL
INTRAVENOUS | Status: DC | PRN
  Administered 2019-11-24: 50 ug/kg/min via INTRAVENOUS

## 2019-11-24 MED ORDER — LACTATED RINGERS IV SOLN
INTRAVENOUS | Status: DC | PRN
  Administered 2019-11-24: 14:00:00 via INTRAVENOUS

## 2019-11-24 MED ORDER — MORPHINE SULFATE (PF) 2 MG/ML IV SOLN
1.0000 mg | Freq: Once | INTRAVENOUS | Status: AC
  Administered 2019-11-24: 1 mg via INTRAVENOUS

## 2019-11-24 MED ORDER — SUCCINYLCHOLINE CHLORIDE 200 MG/10ML IV SOSY
PREFILLED_SYRINGE | INTRAVENOUS | Status: AC
  Filled 2019-11-24: qty 20

## 2019-11-24 MED ORDER — FENTANYL CITRATE (PF) 100 MCG/2ML IJ SOLN
INTRAMUSCULAR | Status: AC
  Administered 2019-11-24: 100 ug via INTRAVENOUS
  Filled 2019-11-24: qty 2

## 2019-11-24 MED ORDER — PROPOFOL 10 MG/ML IV BOLUS
INTRAVENOUS | Status: AC
  Filled 2019-11-24: qty 20

## 2019-11-24 MED ORDER — 0.9 % SODIUM CHLORIDE (POUR BTL) OPTIME
TOPICAL | Status: DC | PRN
  Administered 2019-11-24: 1000 mL

## 2019-11-24 MED ORDER — FENTANYL CITRATE (PF) 100 MCG/2ML IJ SOLN
INTRAMUSCULAR | Status: DC | PRN
  Administered 2019-11-24: 50 ug via INTRAVENOUS

## 2019-11-24 MED ORDER — FENTANYL CITRATE (PF) 100 MCG/2ML IJ SOLN: 25.0000 ug | INTRAMUSCULAR | Status: DC | PRN

## 2019-11-24 MED ORDER — MEPERIDINE HCL 25 MG/ML IJ SOLN: 6.2500 mg | INTRAMUSCULAR | Status: DC | PRN

## 2019-11-24 MED ORDER — LORAZEPAM 2 MG/ML IJ SOLN: 1.0000 mg | INTRAMUSCULAR | Status: DC | PRN

## 2019-11-24 MED ORDER — VITAMIN B-1 100 MG PO TABS
100.0000 mg | ORAL_TABLET | Freq: Every day | ORAL | Status: DC
  Administered 2019-11-26: 100 mg via ORAL
  Filled 2019-11-24 (×2): qty 1

## 2019-11-24 MED ORDER — LIDOCAINE HCL (CARDIAC) PF 100 MG/5ML IV SOSY
PREFILLED_SYRINGE | INTRAVENOUS | Status: DC | PRN
  Administered 2019-11-24: 40 mg via INTRAVENOUS

## 2019-11-24 MED ORDER — ACETAMINOPHEN 160 MG/5ML PO SOLN: 325.0000 mg | ORAL | Status: DC | PRN

## 2019-11-24 MED ORDER — ONDANSETRON HCL 4 MG/2ML IJ SOLN
INTRAMUSCULAR | Status: AC
  Filled 2019-11-24: qty 4

## 2019-11-24 MED ORDER — CLINDAMYCIN PHOSPHATE 600 MG/50ML IV SOLN: 600.0000 mg | Freq: Two times a day (BID) | INTRAVENOUS | Status: DC

## 2019-11-24 MED ORDER — FOLIC ACID 1 MG PO TABS
1.0000 mg | ORAL_TABLET | Freq: Every day | ORAL | Status: DC
  Administered 2019-11-25 – 2019-11-26 (×2): 1 mg via ORAL
  Filled 2019-11-24 (×2): qty 1

## 2019-11-24 MED ORDER — DEXAMETHASONE SODIUM PHOSPHATE 4 MG/ML IJ SOLN
INTRAMUSCULAR | Status: DC | PRN
  Administered 2019-11-24: 10 mg via PERINEURAL

## 2019-11-24 MED ORDER — LIDOCAINE 2% (20 MG/ML) 5 ML SYRINGE
INTRAMUSCULAR | Status: AC
  Filled 2019-11-24: qty 15

## 2019-11-24 MED ORDER — PROPOFOL 1000 MG/100ML IV EMUL
INTRAVENOUS | Status: AC
  Filled 2019-11-24: qty 100

## 2019-11-24 MED ORDER — LORAZEPAM 1 MG PO TABS: 1.0000 mg | ORAL_TABLET | ORAL | Status: DC | PRN

## 2019-11-24 MED ORDER — FENTANYL CITRATE (PF) 250 MCG/5ML IJ SOLN
INTRAMUSCULAR | Status: AC
  Filled 2019-11-24: qty 5

## 2019-11-24 MED ORDER — OXYCODONE HCL 5 MG/5ML PO SOLN: 5.0000 mg | Freq: Once | ORAL | Status: DC | PRN

## 2019-11-24 MED ORDER — ADULT MULTIVITAMIN W/MINERALS CH
1.0000 | ORAL_TABLET | Freq: Every day | ORAL | Status: DC
  Administered 2019-11-25 – 2019-11-26 (×2): 1 via ORAL
  Filled 2019-11-24 (×2): qty 1

## 2019-11-24 MED ORDER — MIDAZOLAM HCL 2 MG/2ML IJ SOLN
INTRAMUSCULAR | Status: AC
  Filled 2019-11-24: qty 2

## 2019-11-24 SURGICAL SUPPLY — 79 items
BENZOIN TINCTURE PRP APPL 2/3 (GAUZE/BANDAGES/DRESSINGS) ×4 IMPLANT
BIT DRILL 2.2 SS TIBIAL (BIT) ×4 IMPLANT
BNDG ELASTIC 2X5.8 VLCR STR LF (GAUZE/BANDAGES/DRESSINGS) ×2 IMPLANT
BNDG GAUZE ELAST 4 BULKY (GAUZE/BANDAGES/DRESSINGS) ×2 IMPLANT
BRUSH SCRUB EZ PLAIN DRY (MISCELLANEOUS) ×4 IMPLANT
COVER SURGICAL LIGHT HANDLE (MISCELLANEOUS) ×4 IMPLANT
COVER WAND RF STERILE (DRAPES) IMPLANT
DRAPE C-ARM 42X72 X-RAY (DRAPES) ×2 IMPLANT
DRAPE C-ARMOR (DRAPES) ×2 IMPLANT
DRAPE IMP U-DRAPE 54X76 (DRAPES) ×2 IMPLANT
DRAPE INCISE IOBAN 66X45 STRL (DRAPES) IMPLANT
DRAPE ORTHO SPLIT 77X108 STRL (DRAPES) ×2
DRAPE SURG 17X11 SM STRL (DRAPES) ×4 IMPLANT
DRAPE SURG ORHT 6 SPLT 77X108 (DRAPES) ×2 IMPLANT
DRAPE U-SHAPE 47X51 STRL (DRAPES) ×4 IMPLANT
DRIVER BIT SQUARE 1.7/2.2 (TRAUMA) ×4 IMPLANT
DRSG ADAPTIC 3X8 NADH LF (GAUZE/BANDAGES/DRESSINGS) ×2 IMPLANT
DRSG CURAD 3X16 NADH (PACKING) ×2 IMPLANT
DRSG PAD ABDOMINAL 8X10 ST (GAUZE/BANDAGES/DRESSINGS) ×2 IMPLANT
ELECT REM PT RETURN 9FT ADLT (ELECTROSURGICAL) ×2
ELECTRODE REM PT RTRN 9FT ADLT (ELECTROSURGICAL) ×1 IMPLANT
EVACUATOR 1/8 PVC DRAIN (DRAIN) IMPLANT
GAUZE SPONGE 4X4 12PLY STRL (GAUZE/BANDAGES/DRESSINGS) ×4 IMPLANT
GAUZE SPONGE 4X4 12PLY STRL LF (GAUZE/BANDAGES/DRESSINGS) ×2 IMPLANT
GLOVE BIO SURGEON STRL SZ7.5 (GLOVE) ×2 IMPLANT
GLOVE BIO SURGEON STRL SZ8 (GLOVE) ×2 IMPLANT
GLOVE BIOGEL PI IND STRL 7.5 (GLOVE) ×2 IMPLANT
GLOVE BIOGEL PI IND STRL 8 (GLOVE) ×1 IMPLANT
GLOVE BIOGEL PI INDICATOR 7.5 (GLOVE) ×2
GLOVE BIOGEL PI INDICATOR 8 (GLOVE) ×1
GOWN STRL REUS W/ TWL LRG LVL3 (GOWN DISPOSABLE) ×2 IMPLANT
GOWN STRL REUS W/ TWL XL LVL3 (GOWN DISPOSABLE) ×1 IMPLANT
GOWN STRL REUS W/TWL 2XL LVL3 (GOWN DISPOSABLE) ×2 IMPLANT
GOWN STRL REUS W/TWL LRG LVL3 (GOWN DISPOSABLE) ×2
GOWN STRL REUS W/TWL XL LVL3 (GOWN DISPOSABLE) ×1
K-WIRE 1.6 (WIRE) ×2
K-WIRE FX5X1.6XNS BN SS (WIRE) ×2
KIT BASIN OR (CUSTOM PROCEDURE TRAY) ×2 IMPLANT
KIT TURNOVER KIT B (KITS) ×2 IMPLANT
KWIRE FX5X1.6XNS BN SS (WIRE) ×2 IMPLANT
LOOP VESSEL MAXI BLUE (MISCELLANEOUS) IMPLANT
MANIFOLD NEPTUNE II (INSTRUMENTS) ×2 IMPLANT
NS IRRIG 1000ML POUR BTL (IV SOLUTION) ×2 IMPLANT
PACK TOTAL JOINT (CUSTOM PROCEDURE TRAY) ×2 IMPLANT
PACK UNIVERSAL I (CUSTOM PROCEDURE TRAY) ×2 IMPLANT
PAD ARMBOARD 7.5X6 YLW CONV (MISCELLANEOUS) ×4 IMPLANT
PAD CAST 3X4 CTTN HI CHSV (CAST SUPPLIES) ×1 IMPLANT
PADDING CAST COTTON 3X4 STRL (CAST SUPPLIES) ×1
PEG LOCKING SMOOTH 2.2X16 (Screw) ×2 IMPLANT
PEG LOCKING SMOOTH 2.2X18 (Peg) ×4 IMPLANT
PEG LOCKING SMOOTH 2.2X20 (Screw) ×4 IMPLANT
PLATE NARROW DVR LEFT (Plate) ×2 IMPLANT
SCREW LOCK 12X2.7X 3 LD (Screw) ×1 IMPLANT
SCREW LOCK 14X2.7X 3 LD TPR (Screw) ×1 IMPLANT
SCREW LOCKING 2.7X12MM (Screw) ×1 IMPLANT
SCREW LOCKING 2.7X13MM (Screw) ×4 IMPLANT
SCREW LOCKING 2.7X14 (Screw) ×1 IMPLANT
SPLINT PLASTER EXTRA FAST 3X15 (CAST SUPPLIES) ×1
SPLINT PLASTER GYPS XFAST 3X15 (CAST SUPPLIES) ×1 IMPLANT
SPONGE LAP 18X18 RF (DISPOSABLE) IMPLANT
STAPLER VISISTAT 35W (STAPLE) ×2 IMPLANT
STOCKINETTE IMPERVIOUS LG (DRAPES) ×2 IMPLANT
SUCTION FRAZIER HANDLE 10FR (MISCELLANEOUS) ×1
SUCTION TUBE FRAZIER 10FR DISP (MISCELLANEOUS) ×1 IMPLANT
SUT ETHIBOND 5 LR DA (SUTURE) ×2 IMPLANT
SUT FIBERWIRE #2 38 T-5 BLUE (SUTURE)
SUT PDS AB 2-0 CT1 27 (SUTURE) IMPLANT
SUT VIC AB 0 CT1 27 (SUTURE) ×2
SUT VIC AB 0 CT1 27XBRD ANBCTR (SUTURE) ×2 IMPLANT
SUT VIC AB 2-0 CT1 27 (SUTURE) ×2
SUT VIC AB 2-0 CT1 TAPERPNT 27 (SUTURE) ×2 IMPLANT
SUT VIC AB 2-0 CT3 27 (SUTURE) IMPLANT
SUTURE FIBERWR #2 38 T-5 BLUE (SUTURE) IMPLANT
SYR 5ML LL (SYRINGE) IMPLANT
TOWEL GREEN STERILE (TOWEL DISPOSABLE) ×4 IMPLANT
TOWEL GREEN STERILE FF (TOWEL DISPOSABLE) ×2 IMPLANT
TRAY FOLEY MTR SLVR 16FR STAT (SET/KITS/TRAYS/PACK) IMPLANT
WATER STERILE IRR 1000ML POUR (IV SOLUTION) ×2 IMPLANT
YANKAUER SUCT BULB TIP NO VENT (SUCTIONS) IMPLANT

## 2019-11-24 NOTE — Progress Notes (Signed)
Dr. Marcelino Scot and Dr. Ambrose Pancoast aware that Plavix has not been stopped.

## 2019-11-24 NOTE — Anesthesia Procedure Notes (Signed)
Anesthesia Regional Block: Supraclavicular block   Pre-Anesthetic Checklist: ,, timeout performed, Correct Patient, Correct Site, Correct Laterality, Correct Procedure, Correct Position, site marked, Risks and benefits discussed,  Surgical consent,  Pre-op evaluation,  At surgeon's request and post-op pain management  Laterality: Left  Prep: chloraprep       Needles:  Injection technique: Single-shot  Needle Type: Echogenic Stimulator Needle     Needle Length: 5cm  Needle Gauge: 22     Additional Needles:   Procedures:, nerve stimulator,,, ultrasound used (permanent image in chart),,,,   Nerve Stimulator or Paresthesia:  Response: hand, 0.45 mA,   Additional Responses:   Narrative:  Start time: 11/24/2019 3:00 PM End time: 11/24/2019 3:12 PM Injection made incrementally with aspirations every 5 mL.  Performed by: Personally  Anesthesiologist: Janeece Riggers, MD  Additional Notes: Functioning IV was confirmed and monitors were applied.  A 56m 22ga Arrow echogenic stimulator needle was used. Sterile prep and drape,hand hygiene and sterile gloves were used. Ultrasound guidance: relevant anatomy identified, needle position confirmed, local anesthetic spread visualized around nerve(s)., vascular puncture avoided.  Image printed for medical record. Negative aspiration and negative test dose prior to incremental administration of local anesthetic. The patient tolerated the procedure well.   NO [place]

## 2019-11-24 NOTE — Anesthesia Preprocedure Evaluation (Addendum)
Anesthesia Evaluation  Patient identified by MRN, date of birth, ID band Patient awake    Reviewed: Allergy & Precautions, H&P , NPO status , Patient's Chart, lab work & pertinent test results, reviewed documented beta blocker date and time   Airway Mallampati: II  TM Distance: >3 FB Neck ROM: full    Dental no notable dental hx.    Pulmonary shortness of breath and with exertion,  oxygen dependent,    Pulmonary exam normal breath sounds clear to auscultation       Cardiovascular Exercise Tolerance: Good hypertension, + Peripheral Vascular Disease and + DOE   Rhythm:regular Rate:Normal  Echo 1/20 - Left ventricle: The cavity size was normal. Wall thickness was   increased in a pattern of mild LVH. Systolic function was normal.   The estimated ejection fraction was in the range of 60% to 65%.   Wall motion was normal; there were no regional wall motion   abnormalities. Left ventricular diastolic function parameters   were normal. - Atrial septum: No defect or patent foramen ovale was identified. - Pulmonary arteries: The main pulmonary artery was moderately   dilated. PA peak pressure: 58 mm Hg (S). - Impressions: Normal GLS -19.5.   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  Peripheral arterial occlusive disease  Pulmonary hypertension (HCC)  Requires supplemental oxygen  2 liters-3liters when moving Sclerodermia generalized      Reproductive/Obstetrics negative OB ROS                           Anesthesia Physical Anesthesia Plan  ASA: III and emergent  Anesthesia Plan: MAC and Regional   Post-op Pain Management: GA combined w/ Regional for post-op pain   Induction:   PONV Risk Score and Plan: 3 and Ondansetron, Dexamethasone and Treatment may vary due to age or medical condition  Airway Management Planned: Nasal Cannula, Simple Face Mask and Mask  Additional Equipment:   Intra-op Plan:   Post-operative Plan: Extubation in OR  Informed Consent: I have reviewed the patients History and Physical, chart, labs and discussed the procedure including the risks, benefits and alternatives for the proposed anesthesia with the patient or authorized representative who has indicated his/her understanding and acceptance.     Dental Advisory Given  Plan Discussed with: CRNA, Anesthesiologist and Surgeon  Anesthesia Plan Comments: (Discussed both nerve block for pain relief post-op and GA; including NV, sore throat, dental injury, and pulmonary complications)       Anesthesia Quick Evaluation

## 2019-11-24 NOTE — Care Management Obs Status (Signed)
[place] NOTIFICATION   Patient Details  Name: [name] [name] MRN: [id number] Date of Birth: Jun 12, 1951   Medicare Observation Status Notification Given:  Yes    Midge Minium MSN, RN, NCM-BC, ACM-RN [phone] 11/24/2019, 2:11 PM

## 2019-11-24 NOTE — Plan of Care (Signed)
  Problem: Clinical Measurements: Goal: Ability to maintain clinical measurements within normal limits will improve Outcome: Progressing   Problem: Elimination: Goal: Will not experience complications related to bowel motility Outcome: Progressing   Problem: Safety: Goal: Ability to remain free from injury will improve Outcome: Progressing   Problem: Pain Managment: Goal: General experience of comfort will improve Outcome: Progressing   Problem: Skin Integrity: Goal: Risk for impaired skin integrity will decrease Outcome: Progressing

## 2019-11-24 NOTE — Anesthesia Postprocedure Evaluation (Signed)
Anesthesia Post Note  Patient: [name] [name]  Procedure(s) Performed: OPEN REDUCTION INTERNAL FIXATION (ORIF) DISTAL RADIAL FRACTURE (Left Elbow)     Patient location during evaluation: PACU Anesthesia Type: MAC Level of consciousness: awake and alert Pain management: pain level controlled Vital Signs Assessment: post-procedure vital signs reviewed and stable Respiratory status: spontaneous breathing, nonlabored ventilation, respiratory function stable and patient connected to nasal cannula oxygen Cardiovascular status: stable and blood pressure returned to baseline Postop Assessment: no apparent nausea or vomiting Anesthetic complications: no    Last Vitals:  Vitals:   11/24/19 1713 11/24/19 1726  BP: (!) 165/60 (!) 152/63  Pulse: 73 71  Resp: 17 18  Temp: 36.6 C   SpO2: 92% 92%    Last Pain:  Vitals:   11/24/19 1713  TempSrc:   PainSc: 0-No pain                 Adren Dollins S

## 2019-11-24 NOTE — Progress Notes (Signed)
  I have personally reviewed and communicated with Dr. Mardelle Matte regarding the patient's presentation, and confirmed the examination findings; I have reviewed and interpreted the x-rays and laboratory studies; and I formulated the plan for treatment which is ORIF of the left distal radius. Because of patient's severe medical problems and deformities which include a thumb and single finger on the right hand, she has great reliance on her left hand. I also discussed with Drs. Fredna Dow the risks and benefits of ORIF and they asserted that it was in the patient's best interest to proceed with surgical repair, as well. Prolonged immobilization presents significant loss of function and skin risks.  I discussed with the patient the risks and benefits of surgery, including the possibility of infection, nerve injury, vessel injury, wound breakdown, arthritis, symptomatic hardware, DVT/ PE, loss of motion, malunion, nonunion, and need for further surgery among others.  We also specifically discussed the potential for loss of motion. She acknowledged these risks and wished to proceed.   Altamese [place], MD [facility] [phone] (p)

## 2019-11-24 NOTE — Anesthesia Procedure Notes (Signed)
Procedure Name: MAC Date/Time: 11/24/2019 3:30 PM Performed by: Mariea Clonts, CRNA Pre-anesthesia Checklist: Patient identified, Emergency Drugs available, Suction available, Patient being monitored and Timeout performed Oxygen Delivery Method: Simple face mask and Nasal cannula

## 2019-11-24 NOTE — Progress Notes (Signed)
Pt Oriented to room environment. Noted: L above eye Hematoma. Noted L back upper shoulder Laceration. Bacitracin applied to both areas per MD orders. Pt with recently removed digits on R hand. L wrist with weakness and soreness, L hip fracture. R LE with ace wrap dressing applied at wound care clinic. Buttocks slightly red but blanchable. Pt with continued pain from L hip fx rated 10/10. Call out to covering MD. Lorra Hals with call back see new orders for a 1X dose or MSO4 mg IV. Pt with relief and sleeping. Call light and phone within reach. Bed exit alarm set. Rounds continued per unit protocol and MD orders.

## 2019-11-24 NOTE — Op Note (Signed)
11/24/2019  5:31 PM  PATIENT:  [name] [name]  68 y.o. female  PRE-OPERATIVE DIAGNOSIS:  Left Distal Radius Fracture  POST-OPERATIVE DIAGNOSIS:  Left Distal Radius Fracture  PROCEDURE:  Procedure(s): OPEN REDUCTION INTERNAL FIXATION (ORIF) DISTAL RADIAL FRACTURE (Left) with DVR plate  SURGEON:  Surgeon(s) and Role:    Altamese [place], MD - Primary  PHYSICIAN ASSISTANT: Ainsley Spinner, PA-C  ANESTHESIA:   regional and IV sedation  I/O:  Total I/O In: 500 [I.V.:500] Out: 20 [Blood:20]  SPECIMEN:  No Specimen  TOURNIQUET:  * No tourniquets in log *  DICTATION: .Note written in EPIC   BRIEF SUMMARY AND INDICATION FOR PROCEDURE:   [name] [name] is a 68 y.o. who sustained a displaced angulated distal radius fracture.  There was both angulation as well as two intra-articular fragments. 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 As a result, demand is high for the left.  I did discuss with the patient the risks and benefits of surgical repair including the potential for arthritis, loss of motion, DVT, PE, symptomatic hardware, nerve injury, vessel injury, infection, and need for further surgery among others. After full discussion, the patient wished to proceed.   BRIEF SUMMARY OF PROCEDURE:  After administration of preoperative regional block and Clindamicin antibiotics, the patient was taken to the operating room where general anesthesia was induced.  The upper extremity was prepped and draped in usual sterile fashion. Tourniquet was placed about the arm, but never inflated during the procedure.  I began with a longitudinal incision, just proximal to the wrist flexion crease.  The FCR tendon sheath was exposed.  The tendon retracted radially and the deep portion of the tendon sheath incised.  The pronator was divided from the radial edge and this  exposed the distal volar radius.  We were able to identify some of the fracture fragments, most of the comminution was dorsal. A reduction maneuver was performed and held on a towel bump with improvement in alignment adequate to completely restore appropriate tilt and angulation. I then applied the distal radius plate and secured fixation in oblong hole then the subcondral segment.  I then checked plate position and reduction at the articular surface. This was followed by additional screws in the subchondral area and metaphysis.  Final reconstruction showed excellent articular congruity with restoration of radial inclination, height, and tilt.  All screws appeared to be of appropriate length.  The wound was irrigated copiously.  The pronator tagged back along the radial edge and then the subcu with 2-0 Vicryl and the skin with nylon.  Sterile gently compressive dressing and volar splint was then applied with the patient's hand and wrist in neutral position. Ainsley Spinner, PAC, assisted me throughout with retraction, reduction, and closure. Of note the DRUJ could translate  but was completely reducible and not acutely injured.   PROGNOSIS: The patient will have unrestricted range of motion of the elbow and return to the office in 10-14 days for removal of sutures.  At that time, we will likely convert into a removable splint vs cast application if concerns about noncompliance or force on the construct.         Astrid Divine. Marcelino Scot, M.D.

## 2019-11-24 NOTE — Progress Notes (Signed)
Progress Note    EVANNE [name]  RFF:[id number] DOB: 1951-01-11  DOA: 11/23/2019 PCP: [name], MD    Brief Narrative:     Medical records reviewed and are as summarized below:  [name] [name] is an 68 y.o. 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  She presents after having a fall at home while walking into the house coming from wound care center.  She is unsure if she passed out or tripped.  States that she has been feeling a little lightheaded.  Her husband reports he was at the car with his back turned at the time and heard her fall.  There after she was unable to stand up or bear weight due to pain.  Denies having any chest pain, worsening shortness of breath, abdominal pain, nausea, vomiting, or fevers.  She had just recently been discharged home 1 week ago after presenting for dehydration with multiple electrolyte abnormalities related to diarrhea.  When she got home she reports that she had been trying to take magnesium supplements, but reported that it caused worsening of her diarrhea.  Husband reports that they had just been trying to eat foods rich in magnesium instead.  Assessment/Plan:   Principal Problem:   Multiple closed stable fractures of pubic ramus (HCC) Active Problems:   Peripheral arterial occlusive disease (HCC)   Essential hypertension   Hyperlipidemia   Hypokalemia   Depression   Scleroderma (HCC)   Prolonged Q-T interval on ECG   CKD (chronic kidney disease), stage III   Chronic respiratory failure (HCC)   Pulmonary hypertension associated with systemic disorder (HCC)   Hypothyroidism   Hypomagnesemia   Distal radius fracture, left   Fall at home, initial encounter    Pubic ramus fractures and displaced distal radius  fracture secondary to fall: Patient presents after having a fall at home coming from the wound care center.  Imaging studies revealed left pubic ramus fractures and left distal radius fracture.  Dr. Mardelle Matte of orthopedics was consulted. -Admit to a medical telemetry bed -Pain control -Dr. Marcelino Scot contacted for ORIF today. Left superior and inferior pubic rami fractures - this will likely to handled non-operatively  Hypomagnesemia: Acute on chronic. On admission magnesium 0.8.  Patient was given 2 g of magnesium sulfate IV in the ED. She reports inability to take oral magnesium replacement due to diarrhea. -continue to replete  Hypokalemia: Acute on chronic.   -replete  Prolonged QT interval: Acute.  On admission QT interval 527 now 468 -Hold QT prolonging medication -Correct electrolyte abnormality  Chronic respiratory failure with hypoxia due to pulmonary hypertension: At home patient on 2 to 3 L depending on her activity level.  O2 saturations maintained on her home 2 to 3 L. -Continue nasal cannula oxygen of 2 to 3 L  Chronic kidney disease stage III: Patient creatinine appears relatively stable at 1.33. -Continue to monitor  Pulmonary artery hypertension: Also likely related to history of scleroderma. -Continue Opsumit  Hypothyroidism: Last TSH within normal limits at 1.975 on 11/11/2019. -Continue levothyroxine   Peripheral vascular disease secondary to scleroderma -Continue Plavix and statin  Depression -Held Celexa due to prolonged QTC  Hyperlipidemia -Continue atorvastatin   Neuropathic ulcer: Patient currently followed in outpatient setting by wound care. -Continue routine wound care  GERD -Held Protonix due to prolonged QT  Current alcohol use -CIWA  Family Communication/Anticipated D/C date and plan/Code Status   DVT prophylaxis: per ortho Code Status: Full Code.  Family Communication:  Disposition Plan: plan appears to be OR today at 4PM and then  tomm will need PT/OT eval   Medical Consultants:    orthopedics  Subjective:   Want to get her surgery "over with"  Objective:    Vitals:   11/23/19 2026 11/23/19 2054 11/24/19 0413 11/24/19 0752  BP: 140/81 (!) 131/48 (!) 130/51 (!) 132/45  Pulse: 70 65 64 62  Resp: _0 Temp:  98.7 F (37.1 C) 98.5 F (36.9 C) 98.6 F (37 C)  TempSrc:  Oral Oral Oral  SpO2: 99% 90% (!) 88% 97%  Weight:      Height:        Intake/Output Summary (Last 24 hours) at 11/24/2019 0903 Last data filed at 11/24/2019 0006 Gross per 24 hour  Intake 440 ml  Output 400 ml  Net 40 ml   Filed Weights   11/23/19 1337  Weight: 61.7 kg    Exam: In bed, chronically ill appearing, uncomfortable rrr No increased work of breathing Moves all 4 ext  Data Reviewed:   I have personally reviewed following labs and imaging studies:  Labs: Labs show the following:   Basic Metabolic Panel: Recent Labs  Lab 11/18/19 1159 11/23/19 1455 11/24/19 0251  NA 138 133* 135  K 3.9 3.2* 3.8  CL 100 92* 97*  CO2 _1 GLUCOSE 87 124* 94  BUN _2 CREATININE 1.24* 1.33* 1.24*  CALCIUM 7.1* 8.0* 7.8*  MG 0.9* 0.8* 1.6*  PHOS  --  4.5  --    GFR Estimated Creatinine Clearance: 41.1 mL/min (A) (by C-G formula based on SCr of 1.24 mg/dL (H)). Liver Function Tests: Recent Labs  Lab 11/18/19 1159 11/23/19 1455  AST 32 45*  ALT 73* 41  ALKPHOS  --  130*  BILITOT 0.4 0.6  PROT 5.9* 6.9  ALBUMIN  --  3.3*   No results for input(s): LIPASE, AMYLASE in the last 168 hours. No results for input(s): AMMONIA in the last 168 hours. Coagulation profile No results for input(s): INR, PROTIME in the last 168 hours.  CBC: Recent Labs  Lab 11/18/19 1159 11/23/19 1411 11/24/19 0251  WBC 7.4 7.6 7.7  NEUTROABS 6,223 6.6  --   HGB 9.6* 10.7* 9.3*  HCT 29.9* 33.8* 29.4*  MCV 100.0 102.4* 103.5*  PLT 331 299 308   Cardiac Enzymes: No results for input(s): CKTOTAL, CKMB,  CKMBINDEX, TROPONINI in the last 168 hours. BNP (last 3 results) No results for input(s): PROBNP in the last 8760 hours. CBG: No results for input(s): GLUCAP in the last 168 hours. D-Dimer: No results for input(s): DDIMER in the last 72 hours. Hgb A1c: No results for input(s): HGBA1C in the last 72 hours. Lipid Profile: No results for input(s): CHOL, HDL, LDLCALC, TRIG, CHOLHDL, LDLDIRECT in the last 72 hours. Thyroid function studies: No results for input(s): TSH, T4TOTAL, T3FREE, THYROIDAB in the last 72 hours.  Invalid input(s): FREET3 Anemia work up: No results for input(s): VITAMINB12, FOLATE, FERRITIN, TIBC, IRON, RETICCTPCT in the last 72 hours. Sepsis Labs: Recent Labs  Lab 11/18/19 1159 11/23/19 1411 11/24/19 0251  WBC 7.4 7.6 7.7    Microbiology Recent Results (from the past 240 hour(s))  SARS CORONAVIRUS 2 (TAT 6-24 HRS) Nasopharyngeal Nasopharyngeal Swab     Status: None   Collection  Time: 11/23/19  7:14 PM   Specimen: Nasopharyngeal Swab  Result Value Ref Range Status   SARS Coronavirus 2 NEGATIVE NEGATIVE Final    Comment: (NOTE) SARS-CoV-2 target nucleic acids are NOT DETECTED. The SARS-CoV-2 RNA is generally detectable in upper and lower respiratory specimens during the acute phase of infection. Negative results do not preclude SARS-CoV-2 infection, do not rule out co-infections with other pathogens, and should not be used as the sole basis for treatment or other patient management decisions. Negative results must be combined with clinical observations, patient history, and epidemiological information. The expected result is Negative. Fact Sheet for Patients: SugarRoll.be Fact Sheet for Healthcare Providers: https://www.woods-mathews.com/ This test is not yet approved or cleared by the [place] FDA and  has been authorized for detection and/or diagnosis of SARS-CoV-2 by FDA under an Emergency Use  Authorization (EUA). This EUA will remain  in effect (meaning this test can be used) for the duration of the COVID-19 declaration under Section 56 4(b)(1) of the Act, 21 U.S.C. section 360bbb-3(b)(1), unless the authorization is terminated or revoked sooner. Performed at [facility], [place] [address] [place], [place] [address]   Surgical pcr screen     Status: Abnormal   Collection Time: 11/23/19  9:35 PM   Specimen: Nasal Mucosa; Nasal Swab  Result Value Ref Range Status   MRSA, PCR NEGATIVE NEGATIVE Final   Staphylococcus aureus POSITIVE (A) NEGATIVE Final    Comment: (NOTE) The Xpert SA Assay (FDA approved for NASAL specimens in patients 24 years of age and older), is one component of a comprehensive surveillance program. It is not intended to diagnose infection nor to guide or monitor treatment. Performed at [facility], [place] [address] [place], [place] [address]     Procedures and diagnostic studies:  Dg Wrist Complete Left  Result Date: 11/23/2019 CLINICAL DATA:  Fall EXAM: LEFT WRIST - COMPLETE 3+ VIEW COMPARISON:  None. FINDINGS: Bones appear osteopenic. Acute impacted fracture involving the distal radius with mild dorsal displacement of distal fracture fragment. No dislocation. Radial subluxation base of the first metacarpal, uncertain chronicity. Soft tissue calcifications at the digits. IMPRESSION: 1. Acute impacted and mildly displaced distal radius fracture 2. Radial subluxation base of the first metacarpal of uncertain chronicity Electronically Signed   By: Donavan Foil M.D.   On: 11/23/2019 15:42   Ct Head Wo Contrast  Result Date: 11/23/2019 CLINICAL DATA:  Head trauma. Patient on anticoagulation. Hematoma to the left eye. EXAM: CT HEAD WITHOUT CONTRAST TECHNIQUE: Contiguous axial images were obtained from the base of the skull through the vertex without intravenous contrast. COMPARISON:  November 11, 2019. FINDINGS: Brain: No evidence of acute  infarction, hemorrhage, hydrocephalus, extra-axial collection or mass lesion/mass effect. Chronic microvascular ischemic changes are again noted. There are postsurgical changes of the posterior right cerebellum. Vascular: No hyperdense vessel or unexpected calcification. Skull: Normal. Negative for fracture or focal lesion. Sinuses/Orbits: No acute finding. There is left periorbital soft tissue swelling without evidence for an underlying fracture. Other: None. IMPRESSION: No acute intracranial abnormality. Chronic findings as detailed above, not significantly changed from recent prior CT. Electronically Signed   By: Constance Holster M.D.   On: 11/23/2019 15:07   Dg Shoulder Left  Result Date: 11/23/2019 CLINICAL DATA:  Fall with shoulder pain EXAM: LEFT SHOULDER - 2+ VIEW COMPARISON:  None. FINDINGS: No fracture or malalignment.  AC joint appears intact. IMPRESSION: No acute osseous abnormality. Electronically Signed   By: Donavan Foil  M.D.   On: 11/23/2019 15:40   Dg Hip Unilat W Or Wo Pelvis 2-3 Views Left  Result Date: 11/23/2019 CLINICAL DATA:  Fall EXAM: DG HIP (WITH OR WITHOUT PELVIS) 2-3V LEFT COMPARISON:  CT 11/08/2019 FINDINGS: Acute displaced fractures involving the left superior and inferior pubic rami. The left femoral head is normally position. There are vascular calcifications. IMPRESSION: Acute displaced fractures involving the left superior and inferior pubic rami. Electronically Signed   By: Donavan Foil M.D.   On: 11/23/2019 15:39    Medications:   . atorvastatin  20 mg Oral q1800  . bacitracin   Topical BID  . clopidogrel  75 mg Oral Daily  . cycloSPORINE  1 drop Both Eyes BID  . iron polysaccharides  150 mg Oral BID  . levothyroxine  50 mcg Oral Q0600  . macitentan  10 mg Oral Daily  . nystatin  5 mL Oral QID  . povidone-iodine  2 application Topical Once  . saccharomyces boulardii  250 mg Oral BID  . sodium chloride flush  3 mL Intravenous Q12H   Continuous  Infusions: . sodium chloride    . clindamycin (CLEOCIN) IV    . magnesium sulfate bolus IVPB       LOS: 0 days   Geradine Girt  [facility]   How to contact the [facility] Attending or Consulting provider [place] or covering provider during after hours [place], for this patient?  1. Check the care team in [facility] and look for a) attending/consulting [facility] provider listed and b) the [facility] team listed 2. Log into [email or web address] and use 's universal password to access. If you do not have the password, please contact the hospital operator. 3. Locate the [facility] provider you are looking for under [facility] and page to a number that you can be directly reached. 4. If you still have difficulty reaching the provider, please page the [facility] (Director on Call) for the Hospitalists listed on amion for assistance.  11/24/2019, 9:03 AM

## 2019-11-24 NOTE — Transfer of Care (Signed)
Immediate Anesthesia Transfer of Care Note  Patient: [name] [name]  Procedure(s) Performed: OPEN REDUCTION INTERNAL FIXATION (ORIF) DISTAL RADIAL FRACTURE (Left Elbow)  Patient Location: PACU  Anesthesia Type:MAC combined with regional for post-op pain  Level of Consciousness: awake, alert  and oriented  Airway & Oxygen Therapy: Patient Spontanous Breathing and Patient connected to nasal cannula oxygen  Post-op Assessment: Report given to RN and Post -op Vital signs reviewed and stable  Post vital signs: Reviewed and stable  Last Vitals:  Vitals Value Taken Time  BP 165/60 11/24/19 1713  Temp 36.6 C 11/24/19 1713  Pulse 68 11/24/19 1723  Resp 17 11/24/19 1723  SpO2 93 % 11/24/19 1723  Vitals shown include unvalidated device data.  Last Pain:  Vitals:   11/24/19 1713  TempSrc:   PainSc: 0-No pain      Patients Stated Pain Goal: 3 (70/[id number]/95 [id number])  Complications: No apparent anesthesia complications

## 2019-11-24 NOTE — Progress Notes (Signed)
Orthopedic Tech Progress Note Patient Details:  [name] [name] 1951/12/14 [id number]  Ortho Devices Type of Ortho Device: Sling immobilizer Ortho Device/Splint Location: left Ortho Device/Splint Interventions: Application   Post Interventions Patient Tolerated: Well Instructions Provided: Care of device   [name] 11/24/2019, 6:55 PM

## 2019-11-25 ENCOUNTER — Encounter (HOSPITAL_COMMUNITY): Payer: Self-pay | Admitting: Orthopedic Surgery

## 2019-11-25 DIAGNOSIS — F329 Major depressive disorder, single episode, unspecified: Secondary | ICD-10-CM | POA: Diagnosis not present

## 2019-11-25 DIAGNOSIS — S32592A Other specified fracture of left pubis, initial encounter for closed fracture: Secondary | ICD-10-CM | POA: Diagnosis not present

## 2019-11-25 DIAGNOSIS — E876 Hypokalemia: Secondary | ICD-10-CM | POA: Diagnosis not present

## 2019-11-25 LAB — BASIC METABOLIC PANEL
Anion gap: 10 (ref 5–15)
BUN: 14 mg/dL (ref 8–23)
CO2: 26 mmol/L (ref 22–32)
Calcium: 7.1 mg/dL — ABNORMAL LOW (ref 8.9–10.3)
Chloride: 99 mmol/L (ref 98–111)
Creatinine, Ser: 0.89 mg/dL (ref 0.44–1.00)
GFR calc Af Amer: >60 mL/min (ref >60)
GFR calc non Af Amer: >60 mL/min (ref >60)
Glucose, Bld: 138 mg/dL — ABNORMAL HIGH (ref 70–99)
Potassium: 4 mmol/L (ref 3.5–5.1)
Sodium: 135 mmol/L (ref 135–145)

## 2019-11-25 LAB — CBC
HCT: 19.4 % — ABNORMAL LOW (ref 36.0–46.0)
HCT: 21.9 % — ABNORMAL LOW (ref 36.0–46.0)
Hemoglobin: 5.9 g/dL — CL (ref 12.0–15.0)
Hemoglobin: 6.8 g/dL — CL (ref 12.0–15.0)
MCH: 32.8 pg (ref 26.0–34.0)
MCH: 32.5 pg (ref 26.0–34.0)
MCHC: 30.4 g/dL (ref 30.0–36.0)
MCHC: 31.1 g/dL (ref 30.0–36.0)
MCV: 107.8 fL — ABNORMAL HIGH (ref 80.0–100.0)
MCV: 104.8 fL — ABNORMAL HIGH (ref 80.0–100.0)
Platelets: 187 10*3/uL (ref 150–400)
Platelets: 319 10*3/uL (ref 150–400)
RBC: 1.8 MIL/uL — ABNORMAL LOW (ref 3.87–5.11)
RBC: 2.09 MIL/uL — ABNORMAL LOW (ref 3.87–5.11)
RDW: 14.1 % (ref 11.5–15.5)
RDW: 13.9 % (ref 11.5–15.5)
WBC: 7.7 10*3/uL (ref 4.0–10.5)
WBC: 12.6 10*3/uL — ABNORMAL HIGH (ref 4.0–10.5)
nRBC: 0 % (ref 0.0–0.2)
nRBC: 0 % (ref 0.0–0.2)

## 2019-11-25 LAB — HEMOGLOBIN AND HEMATOCRIT, BLOOD
HCT: 30.7 % — ABNORMAL LOW (ref 36.0–46.0)
Hemoglobin: 9.5 g/dL — ABNORMAL LOW (ref 12.0–15.0)

## 2019-11-25 LAB — PREPARE RBC (CROSSMATCH)

## 2019-11-25 LAB — MAGNESIUM: Magnesium: 1.8 mg/dL (ref 1.7–2.4)

## 2019-11-25 MED ORDER — OXYCODONE-ACETAMINOPHEN 5-325 MG PO TABS: 1.0000 | ORAL_TABLET | Freq: Four times a day (QID) | ORAL | 0 refills | Status: DC | PRN

## 2019-11-25 MED ORDER — ACETAMINOPHEN 500 MG PO TABS: 500.0000 mg | ORAL_TABLET | Freq: Two times a day (BID) | ORAL | 0 refills | Status: DC

## 2019-11-25 MED ORDER — OXYCODONE-ACETAMINOPHEN 5-325 MG PO TABS
1.0000 | ORAL_TABLET | Freq: Four times a day (QID) | ORAL | Status: DC | PRN
  Administered 2019-11-25: 2 via ORAL
  Filled 2019-11-25: qty 2

## 2019-11-25 MED ORDER — SODIUM CHLORIDE 0.9% IV SOLUTION: Freq: Once | INTRAVENOUS | Status: AC

## 2019-11-25 MED ORDER — MAGNESIUM SULFATE 2 GM/50ML IV SOLN
2.0000 g | Freq: Once | INTRAVENOUS | Status: AC
  Administered 2019-11-25: 2 g via INTRAVENOUS
  Filled 2019-11-25: qty 50

## 2019-11-25 MED ORDER — VITAMIN D 25 MCG (1000 UNIT) PO TABS
2000.0000 [IU] | ORAL_TABLET | Freq: Every day | ORAL | Status: DC
  Administered 2019-11-25 – 2019-11-26 (×2): 2000 [IU] via ORAL
  Filled 2019-11-25: qty 2

## 2019-11-25 MED ORDER — ACETAMINOPHEN 500 MG PO TABS
500.0000 mg | ORAL_TABLET | Freq: Two times a day (BID) | ORAL | Status: DC
  Administered 2019-11-25 – 2019-11-26 (×2): 500 mg via ORAL
  Filled 2019-11-25 (×2): qty 1

## 2019-11-25 NOTE — Progress Notes (Signed)
Notified Hospitalist, Hgb 5.9. Pt asymptomatic. Will continue to monitor until advised.

## 2019-11-25 NOTE — Evaluation (Signed)
Occupational Therapy Evaluation Patient Details Name: [name] [name] MRN: [id number] DOB: August 08, 1951 Today's Date: 11/25/2019    History of Present Illness 68 yo female admitted post mechanical fall with LOC, CT of brain did not show acute abnormalities, xrays revealed acute displaced distal radius fx of left wrist and inferior and superior pubic left ramus fx. PMH includes non hogdkins lymphoma of the brain s/p chemo, scleroderma with PVD, HTN, chronic pulmonary HTN, chronic respiratory failure, chronic leg ulcers, R index, middle and little finger ampuation   Clinical Impression   Pt PTA: pt living with spouse at home and reports independence. Pt currently very limited by limited use of BUEs, LUE splinted and NWB through wrist; decreased strength, decreased mobility, decreased ability to properly care for self and decreased processing for following commands. Pt weakness/pain in L pelvis region. Pt requiring momentous sit to stands and maximal cueing for each step repeatedly. Pt requiring constant cueing with multimodal cues. Pt minA/modA +2 for bed mobility. Pt transfers with minA to modA +2 overall for maximal multimodal cues to complete task and no carry over skills from 1 step to the next. Pt modA +2 for sliding board transfer to and from w/c. Pt maxA over all for ADL. 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 Pt requires increased assist for all functional transfers and ADL. Pt would benefit from continued OT skilled services for ADL, mobility and safety iN CIR setting. OT following.      Follow Up Recommendations  CIR;Supervision/Assistance - 24 hour    Equipment Recommendations  3 in 1 bedside commode;Other (comment);Wheelchair (measurements OT);Wheelchair cushion (measurements OT)(drop arm BSC)    Recommendations for Other Services Rehab consult     Precautions / Restrictions Precautions Precautions: Fall Required Braces or Orthoses:  Splint/Cast Splint/Cast: L UE Restrictions Weight Bearing Restrictions: Yes LUE Weight Bearing: Non weight bearing RLE Weight Bearing: Weight bearing as tolerated LLE Weight Bearing: Weight bearing as tolerated      Mobility Bed Mobility Overal bed mobility: Needs Assistance Bed Mobility: Supine to Sit;Sit to Supine     Supine to sit: Min assist;+2 for physical assistance;HOB elevated Sit to supine: Mod assist;+2 for physical assistance   General bed mobility comments: minA for trunk elevation and BLE management  Transfers Overall transfer level: Needs assistance Equipment used: Left platform walker;Sliding board Transfers: Sit to/from [facility];Lateral/Scoot Transfers Sit to Stand: Mod assist;+2 physical assistance;+2 safety/equipment Stand pivot transfers: Min assist;+2 physical assistance;+2 safety/equipment      Lateral/Scoot Transfers: Mod assist;Min assist;+2 physical assistance;With slide board General transfer comment: pt requiring momentous sit to stands and maximal cueing for each step repeatedly. Pt requiring constant cueing with multimodal cues.    Balance Overall balance assessment: Needs assistance Sitting-balance support: Feet supported;No upper extremity supported;Single extremity supported Sitting balance-Leahy Scale: Fair Sitting balance - Comments: reliant on UE support from RUE to sit EOB, pt has right lateral lean in sitting especially when fatiguing   Standing balance support: Bilateral upper extremity supported;During functional activity Standing balance-Leahy Scale: Poor Standing balance comment: reliant on RW+ platform and OT/PT                           ADL either performed or assessed with clinical judgement   ADL Overall ADL's : Needs assistance/impaired Eating/Feeding: Minimal assistance;Bed level Eating/Feeding Details (indicate cue type and reason): RUE already limited and LUE hand limited by splint and  discomfort of splint/fx. Grooming: Minimal assistance;Sitting;Cueing for sequencing;Cueing for safety Grooming Details (indicate cue type and reason): limited by BUEs Upper Body Bathing: Moderate assistance   Lower Body Bathing: Moderate assistance;+2 for physical assistance;+2 for safety/equipment;Cueing for safety;Cueing for sequencing;Sitting/lateral leans;Sit to/from stand   Upper Body Dressing : Minimal assistance;Sitting;Standing;Cueing for safety   Lower Body Dressing: Moderate assistance;+2 for physical assistance;+2 for safety/equipment;Cueing for safety;Cueing for sequencing;Sitting/lateral leans;Sit to/from stand   Toilet Transfer: Moderate assistance;+2 for physical assistance;+2 for safety/equipment;BSC;Requires drop arm   Toileting- Clothing Manipulation and Hygiene: Moderate assistance;+2 for physical assistance;+2 for safety/equipment;Cueing for safety;Cueing for sequencing;Sitting/lateral lean;Sit to/from stand       Functional mobility during ADLs: Moderate assistance;+2 for physical assistance;+2 for safety/equipment;Cueing for safety;Cueing for sequencing;Rolling walker(platform) General ADL Comments: Pt very limited by limited use of BUEs, LUE splinted and NWB through wrist; decreased strength, decreased mobility, decreased ability to properly care for self and decreased processing for following commands.     Vision Baseline Vision/History: No visual deficits Vision Assessment?: No apparent visual deficits     Perception     Praxis      Pertinent Vitals/Pain Pain Assessment: Faces Faces Pain Scale: Hurts even more Pain Location: LLE Pain Descriptors / Indicators: Grimacing;Guarding Pain Intervention(s): Limited activity within patient's tolerance;Repositioned;RN gave pain meds during session     Hand Dominance Left(R hand 3rd-5th digits)   Extremity/Trunk Assessment Upper Extremity Assessment Upper Extremity Assessment: Generalized weakness;LUE  deficits/detail LUE Deficits / Details: L distal radius fx, wrapped/splinted LUE: Unable to fully assess due to immobilization LUE Coordination: decreased fine motor;decreased gross motor   Lower Extremity Assessment Lower Extremity Assessment: Generalized weakness;Defer to PT evaluation LLE: Unable to fully assess due to pain   Cervical / Trunk Assessment Cervical / Trunk Assessment: Kyphotic   Communication Communication Communication: No difficulties   Cognition Arousal/Alertness: Awake/alert Behavior During Therapy: WFL for tasks assessed/performed Overall Cognitive Status: Impaired/Different from baseline Area of Impairment: Problem solving;Safety/judgement;Following commands                       Following Commands: Follows one step commands inconsistently;Follows one step commands with increased time Safety/Judgement: Decreased awareness of deficits;Decreased awareness of safety   Problem Solving: Slow processing;Decreased initiation;Difficulty sequencing;Requires verbal cues;Requires tactile cues General Comments: patient A/O x4   General Comments  VSS. Bandage over RLE wounds    Exercises     Shoulder Instructions      Home Living Family/patient expects to be discharged to:: Private residence Living Arrangements: Spouse/significant other Available Help at Discharge: Family;Available 24 hours/day Type of Home: House Home Access: Stairs to enter [employer] of Steps: 2 Entrance Stairs-Rails: Can reach both Home Layout: Two level;Able to live on main level with bedroom/bathroom     Bathroom Shower/Tub: Walk-in shower   Bathroom Toilet: Handicapped height     Home Equipment: Environmental consultant - 2 wheels;Bedside commode;Wheelchair - manual          Prior Functioning/Environment Level of Independence: Independent        Comments: pt was independent prior        OT Problem List: Decreased strength;Decreased activity tolerance;Impaired  balance (sitting and/or standing);Decreased safety awareness      OT Treatment/Interventions: Self-care/ADL training;Therapeutic exercise;DME and/or AE instruction;Therapeutic activities;Patient/family education;Balance training    OT Goals(Current goals can be found in the care plan section) Acute Rehab OT Goals Patient Stated Goal: go home OT Goal Formulation: With patient Time For Goal Achievement: 12/09/19 Potential to Achieve Goals:  Good ADL Goals Pt Will Perform Eating: with set-up;sitting;with adaptive utensils Pt Will Perform Grooming: with set-up;sitting Pt Will Perform Lower Body Dressing: with min assist;sitting/lateral leans;sit to/from stand Pt Will Transfer to Toilet: with min assist;stand pivot transfer;bedside commode Pt/caregiver will Perform Home Exercise Program: Increased strength;Both right and left upper extremity;With written HEP provided Additional ADL Goal #1: Pt will perform OOB ADL task following 100% of commands in 3/5 trials with 1-2 verbal cues for proper technique.  OT Frequency: Min 2X/week   Barriers to D/C:            Co-evaluation PT/OT/SLP Co-Evaluation/Treatment: Yes Reason for Co-Treatment: Complexity of the patient's impairments (multi-system involvement);For patient/therapist safety;To address functional/ADL transfers   OT goals addressed during session: ADL's and self-care      AM-PAC OT "6 Clicks" Daily Activity     Outcome Measure Help from another person eating meals?: A Lot Help from another person taking care of personal grooming?: A Lot Help from another person toileting, which includes using toliet, bedpan, or urinal?: Total Help from another person bathing (including washing, rinsing, drying)?: A Lot Help from another person to put on and taking off regular upper body clothing?: A Lot Help from another person to put on and taking off regular lower body clothing?: Total 6 Click Score: 10   End of Session Equipment Utilized  During Treatment: Oxygen Nurse Communication: Mobility status;Weight bearing status;Patient requests pain meds  Activity Tolerance: Patient tolerated treatment well Patient left: in bed;with call bell/phone within reach;with bed alarm set;with family/visitor present  OT Visit Diagnosis: Unsteadiness on feet (R26.81);Other abnormalities of gait and mobility (R26.89);Muscle weakness (generalized) (M62.81)                Time: 0223-3612 OT Time Calculation (min): 89 min Charges:  OT General Charges $OT Visit: 1 Visit OT Evaluation $OT Eval High Complexity: 1 High OT Treatments $Self Care/Home Management : 8-22 mins  Ebony Hail Harold Hedge) Marsa Aris OTR/L Acute Rehabilitation Services Pager: [phone] Office: [place] 11/25/2019, 3:37 PM

## 2019-11-25 NOTE — Care Management (Addendum)
Durable Medical Equipment  (From admission, onward)         Start     Ordered   11/25/19 1607  For home use only DME Other see comment  Once    Comments: Transfer board 30 inches  Question:  Length of Need  Answer:  Lifetime   11/25/19 1607   11/25/19 1606  For home use only DME Other see comment  Once    Comments: Platform for rolling walker for left side  Question:  Length of Need  Answer:  Lifetime   11/25/19 1607   11/25/19 1553  For home use only DME lightweight manual wheelchair with seat cushion  Once    Comments: Patient suffers from a acute displaced distal radius fx of left wrist and inferior and superior pubic left ramus fx which impairs their ability to perform daily activities like toileting, feeding, dressing, grooming, bathing in the home.  A cane, walker, crutch will not resolve issue with performing activities of daily living. A wheelchair will allow patient to safely perform daily activities. Patient is not able to propel themselves in the home using a standard weight wheelchair due to arm weakness, general weakness, endurance. Patient can self propel in the lightweight wheelchair.  Length of need: Lifetime  Accessories: elevating leg rests (ELRs), wheel locks, extensions and anti-tippers.   11/25/19 1552   11/25/19 1552  For home use only DME Walker rolling  Once    Comments: Platform walker  Question:  Patient needs a walker to treat with the following condition  Answer:  Fractures   11/25/19 1552   11/25/19 1551  For home use only DME Bedside commode  Once    Comments: Drop arm  Question:  Patient needs a bedside commode to treat with the following condition  Answer:  Fractures   11/25/19 [place]

## 2019-11-25 NOTE — Progress Notes (Signed)
Orthopaedic Trauma Service Progress Note  [name] MRN: [id number] DOB/AGE: Aug 13, 1951 68 y.o.  Subjective:  Doing ok this am  Pain controlled Block has worn off   Eating breakfast with assistance   No CP or SOB  Getting 1 unit of PRBCs this am   ROS As above Objective:   VITALS:   Vitals:   11/25/19 0342 11/25/19 0646 11/25/19 0723 11/25/19 0934  BP: (!) 126/52 (!) 127/38 (!) 124/55 (!) 146/66  Pulse: 61 61 61 61  Resp: _0 Temp: 98 F (36.7 C) 98.4 F (36.9 C) 98.5 F (36.9 C) 98.6 F (37 C)  TempSrc: Oral Oral Oral Oral  SpO2: (!) 52% 94% (!) 3% 96%  Weight:      Height:        Estimated body mass index is 20.68 kg/m as calculated from the following:   Height as of this encounter: _1  (1.727 m).   Weight as of this encounter: 61.7 kg.   Intake/Output      11/24 0701 - 11/25 0700 11/25 0701 - 11/26 0700   P.O. 0    I.V. (mL/kg) 1138.1 (18.4)    Blood  359.9   IV Piggyback 50    Total Intake(mL/kg) 1188.1 (19.3) 359.9 (5.8)   Urine (mL/kg/hr) 1000 (0.7)    Blood 20    Total Output 1020    Net +168.1 +359.9          LABS  Results for orders placed or performed during the hospital encounter of 11/23/19 (from the past 24 hour(s))  CBC     Status: Abnormal   Collection Time: 11/25/19  2:58 AM  Result Value Ref Range   WBC 7.7 4.0 - 10.5 K/uL   RBC 1.80 (L) 3.87 - 5.11 MIL/uL   Hemoglobin 5.9 (LL) 12.0 - 15.0 g/dL   HCT 19.4 (L) 36.0 - 46.0 %   MCV 107.8 (H) 80.0 - 100.0 fL   MCH 32.8 26.0 - 34.0 pg   MCHC 30.4 30.0 - 36.0 g/dL   RDW 14.1 11.5 - 15.5 %   Platelets 187 150 - 400 K/uL   nRBC 0.0 0.0 - 0.2 %  Type and screen [place]     Status: None (Preliminary result)   Collection Time: 11/25/19  4:45 AM  Result Value Ref Range   ABO/RH(D) B POS    Antibody Screen NEG    Sample Expiration 11/28/2019,2359    Unit Number  [id number]    Blood Component Type RED CELLS,LR    Unit division 00    Status of Unit ISSUED    Transfusion Status OK TO TRANSFUSE    Crossmatch Result      Compatible Performed at [facility], [address], [place], [place] [address]   Basic metabolic panel     Status: Abnormal   Collection Time: 11/25/19  4:45 AM  Result Value Ref Range   Sodium 135 135 - 145 mmol/L   Potassium 4.0 3.5 - 5.1 mmol/L   Chloride 99 98 - 111 mmol/L   CO2 26 22 - 32 mmol/L   Glucose, Bld 138 (H) 70 - 99 mg/dL   BUN 14 8 - 23 mg/dL   Creatinine, Ser 0.89 0.44 - 1.00 mg/dL  Calcium 7.1 (L) 8.9 - 10.3 mg/dL   GFR calc non Af Amer >60 >60 mL/min   GFR calc Af Amer >60 >60 mL/min   Anion gap 10 5 - 15  CBC     Status: Abnormal   Collection Time: 11/25/19  4:45 AM  Result Value Ref Range   WBC 12.6 (H) 4.0 - 10.5 K/uL   RBC 2.09 (L) 3.87 - 5.11 MIL/uL   Hemoglobin 6.8 (LL) 12.0 - 15.0 g/dL   HCT 21.9 (L) 36.0 - 46.0 %   MCV 104.8 (H) 80.0 - 100.0 fL   MCH 32.5 26.0 - 34.0 pg   MCHC 31.1 30.0 - 36.0 g/dL   RDW 13.9 11.5 - 15.5 %   Platelets 319 150 - 400 K/uL   nRBC 0.0 0.0 - 0.2 %  Magnesium     Status: None   Collection Time: 11/25/19  4:45 AM  Result Value Ref Range   Magnesium 1.8 1.7 - 2.4 mg/dL  Prepare RBC     Status: None   Collection Time: 11/25/19  6:27 AM  Result Value Ref Range   Order Confirmation      ORDER PROCESSED BY BLOOD BANK Performed at [facility], [place] [address], [place], [place] [address]      PHYSICAL EXAM:   Gen: sitting up in bed, NAD, appears well, pleasant  Pelvis:  No instability L hemipelvis  Exam stable  Mild tenderness  Ext:       Left upper extremity   Splint fitting well  Dressing c/d/i  Chronic deformity L hand   Motor and sensory functions grossly intact  Hand is warm   Good color   Swelling stable   Assessment/Plan: 1 Day Post-Op   Principal Problem:   Multiple closed stable fractures of pubic ramus (HCC) Active  Problems:   Peripheral arterial occlusive disease (HCC)   Essential hypertension   Hyperlipidemia   Hypokalemia   Depression   Scleroderma (HCC)   Prolonged Q-T interval on ECG   CKD (chronic kidney disease), stage III   Chronic respiratory failure (HCC)   Pulmonary hypertension associated with systemic disorder (HCC)   Hypothyroidism   Hypomagnesemia   Distal radius fracture, left   Fall at home, initial encounter   Wrist fracture   Anti-infectives (From admission, onward)   Start     Dose/Rate Route Frequency Ordered Stop   11/25/19 0300  clindamycin (CLEOCIN) IVPB 600 mg  Status:  Discontinued     600 mg 100 mL/hr over 30 Minutes Intravenous Every 12 hours 11/24/19 1744 11/24/19 1807   11/24/19 2330  clindamycin (CLEOCIN) IVPB 600 mg     600 mg 100 mL/hr over 30 Minutes Intravenous Every 8 hours 11/24/19 1807 11/25/19 1529   11/24/19 0600  clindamycin (CLEOCIN) IVPB 900 mg     900 mg 100 mL/hr over 30 Minutes Intravenous To Short Stay 11/23/19 2311 11/24/19 1611   11/23/19 0000  doxycycline (VIBRAMYCIN) 100 MG capsule  Status:  Discontinued     100 mg Oral 2 times daily 11/23/19 1539 11/23/19     .  POD/HD#: 1  68 y/o female s/p fall   - fall   -L distal radius fracture s/p ORIF 11/24/2019  NWB L wrist   Ok to wb thru elbow with platform walker if needed  Will convert to removable brace in 2 weeks  PT/OT evals    Unrestricted ROM L elbow and shoulder   Does not need to wear  sling     Ice and elevate for swelling and pain control   - L LC 1 pelvic ring fx/insufficiency fractures  WBAT L LEx  May need to use walker to assist with ambulation    - Pain management:  States in past hydrocodone did not help at all    Scheduled tylenol 500 mg po q12h   Percocet 5/325 1-2 po q6h prn pain   - ABL anemia/Hemodynamics  Monitor   Cbc in am   Very minimal blood loss in OR  - Medical issues   Per primary   - DVT/PE prophylaxis:  On plavix for her chronic  vascular disease   Do not think she needs anything additional from ortho standpoint  - ID:   periop abx  - Metabolic Bone Disease:  Check vitamin d   Low energy distal radius fracture suggestive of poor bone quality   Pt osteoporotic on DEXA in 2018   Looks like she is being followed by her PCP   Recommend new DEXA   Continue vitamin d and calcium   - Activity:  As above  - FEN/GI prophylaxis/Foley/Lines:  Reg diet  -Ex-fix/Splint care:  Keep splint clean and dry   Do not remove splint for any reason   - Impediments to fracture healing:  Chronic autoimmune disease  Osteoporosis   - Dispo:  Ortho issues stable  Follow up with ortho in 10-14 days     Jari Pigg, PA-C [phone] (C) 11/25/2019, 10:07 AM  [facility] [place] [address] [phone] Jenetta [phone] (F)   After 6pm on weekdays please call office number to get in touch with on call provider or refer to Amion and look to see who is on call for the Sports Medicine Call Group which is listed under orthopaedics   On Weekends please call office number to get in touch with on call provider or refer to Amion and look to see who is on call for the Sports Medicine Call Group which is listed under orthopaedics

## 2019-11-25 NOTE — Progress Notes (Signed)
Hgb 6.8, Dr. Ree Kida. 1 unit of place started, no reaction.

## 2019-11-25 NOTE — Progress Notes (Signed)
Rehab Admissions Coordinator Note:  Per PT/OT recommendation, patient was screened by Michel Santee for appropriateness for an Inpatient Acute Rehab Consult.  At this time, we are recommending Inpatient Rehab consult.  I will place a rehab consult order so that we can better assess pt for CIR candidacy.   Michel Santee 11/25/2019, 4:05 PM  I can be reached at [phone].

## 2019-11-25 NOTE — Evaluation (Signed)
Physical Therapy Evaluation Patient Details Name: [name] [name] MRN: [id number] DOB: 1951/12/21 Today's Date: 11/25/2019   History of Present Illness  68 yo female admitted post mechanical fall with LOC, CT of brain did not show acute abnormalities, xrays revealed acute displaced distal radius fx of left wrist and inferior and superior pubic left ramus fx. 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 Pt in bed upon arrival with spouse present. Pt and spouse both reporting they are going home today and are adamant about returning home especially with holiday tomorrow and granddaughter visiting from [place]. Pt presents with pain, decreased cognition, ROM, strength, balance and endurance limiting functional mobility. Pt required min-mod A x2 for bed mobility. Upon sitting EOB, multiple unsuccessful attempts to stand despite assist x2. Pt required mod A x2 utilizing rocking for momentum to achieve full upright standing. Pt required A to get LUE in platform. Pt required min A x2 for stand pivot transfer bed <> recliner requiring max cuing for technique and assist to manage RW. 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 Pt with decreased weight acceptance on LUE secondary to pain increasing difficulty of stand pivot transfer. Pt very fatigued after performing stand pivot. Pt and spouse reported they could manage transfers at home alone. Pt progressed to min A x2 to rise from recliner to transfer back to bed. Pt educated on transfer board transfer to manual w/c. Pt required mod A x2 to complete transfer board transfer to/from bed to w/c. Pts husband states he can provide adequate assistance at home and would try to get  neighbors to help him. Pt and spouse educated on how to set up w/c for transfer, locking w/c and using sliding board however reinforcement is needed to ensure safety with transfer. Pt and spouse educated on recommendation and benefit from CIR to improve independence with mobility in order to decrease caregiver burden and decrease fall risk. At this time pt is a high fall risk and is requiring +2 assist for functional mobility. Pt would benefit from skilled acute PT to improve deficits to progress functional mobility in order to decrease fall risk. Recommendation for CIR based on current mobility deficits and decreased caregiver support in order to maximize independence, decrease fall risk and to return to PLOF.   Follow Up Recommendations CIR;Supervision/Assistance - 24 hour;Other (comment)(HHPT if pt and family decline CIR)    Equipment Recommendations  Wheelchair (measurements PT);Wheelchair cushion (measurements PT);Other (comment)(platform for RW, transfer board, drop arm 3-in-1)    Recommendations for Other Services       Precautions / Restrictions Precautions Precautions: Fall Required Braces or Orthoses: Splint/Cast Splint/Cast: L UE Restrictions Weight Bearing Restrictions: Yes LUE Weight Bearing: Non weight bearing LLE Weight Bearing: Weight bearing as tolerated      Mobility  Bed Mobility Overal bed mobility: Needs Assistance Bed Mobility: Supine to Sit;Sit to Supine     Supine to sit: Min assist;+2 for physical assistance;HOB elevated Sit to supine: Mod assist;+2 for physical assistance   General bed mobility comments: min A to get EOB requiring A for trunk elevation and LE advancement, cuing for technique  Transfers Overall transfer level: Needs assistance Equipment used: Left platform walker;Sliding board Transfers: Sit to/from Stand;Stand  Pivot Transfers;Lateral/Scoot Transfers Sit to Stand: Mod assist;+2 physical assistance;+2 safety/equipment Stand pivot  transfers: Min assist;+2 physical assistance;+2 safety/equipment      Lateral/Scoot Transfers: Mod assist;Min assist;+2 physical assistance;With slide board General transfer comment: pt had multiple unsuccessful attempts to stand from bed, pt required mod A x2 to achieve full upright standing requiring physical assist and max cuing, pt requires A to get left UE on platform and for RW mgt, pt progressed to min A x2 to stand from recliner. min A x2 for stand pivot with pt requiring assist for RW mgt and max verbal and tactile cuing for technique and sequencing, pt has difficulty taking steps and pivoting feet for transfer, pt required mod A x2 for transfer board transfer to/from bed to w/c, spouse educated on proper guarding and how to assist pt and use of transfer board, pt required max cuing for transfer board transfer and progressed to min A to scoot on transfer board, pt requires A to off weight in order to place and remove transfer board  Ambulation/Gait             General Gait Details: unable  Stairs            Wheelchair Mobility    Modified Rankin (Stroke Patients Only)       Balance Overall balance assessment: Needs assistance Sitting-balance support: Feet supported;No upper extremity supported;Single extremity supported Sitting balance-Leahy Scale: Fair Sitting balance - Comments: reliant on UE support from RUE to sit EOB, pt has right lateral lean in sitting especially when fatiguing   Standing balance support: Bilateral upper extremity supported;During functional activity Standing balance-Leahy Scale: Zero Standing balance comment: reliant on external support fro RW and therapists                             Pertinent Vitals/Pain Faces Pain Scale: Hurts even more Pain Location: LLE Pain Descriptors / Indicators: Grimacing;Guarding Pain Intervention(s): Limited activity within patient's tolerance;Monitored during session;Patient requesting pain  meds-RN notified;Repositioned    Home Living Family/patient expects to be discharged to:: Private residence Living Arrangements: Spouse/significant other Available Help at Discharge: Family;Available 24 hours/day Type of Home: House Home Access: Stairs to enter Entrance Stairs-Rails: Can reach both Entrance Stairs-Number of Steps: 2 Home Layout: Two level;Able to live on main level with bedroom/bathroom Home Equipment: Gilford Rile - 2 wheels;Bedside commode;Wheelchair - manual      Prior Function Level of Independence: Independent         Comments: Per pt and husband, pt was 100% independent with mobility and ADLs PTA, had just slowed down due to recent GI virus. Pt with no history of falls.     Hand Dominance        Extremity/Trunk Assessment   Upper Extremity Assessment Upper Extremity Assessment: Generalized weakness;Defer to OT evaluation    Lower Extremity Assessment Lower Extremity Assessment: Generalized weakness;LLE deficits/detail LLE: Unable to fully assess due to pain    Cervical / Trunk Assessment Cervical / Trunk Assessment: Kyphotic  Communication   Communication: No difficulties  Cognition Arousal/Alertness: Awake/alert Behavior During Therapy: WFL for tasks assessed/performed Overall Cognitive Status: Impaired/Different from baseline Area of Impairment: Problem solving;Safety/judgement;Following commands                       Following Commands: Follows one step commands inconsistently;Follows one step commands with increased time Safety/Judgement: Decreased awareness of deficits;Decreased awareness of safety   Problem Solving: Slow  processing;Decreased initiation;Difficulty sequencing;Requires verbal cues;Requires tactile cues        General Comments General comments (skin integrity, edema, etc.): VSS, bandaging over chronic wounds on RLE    Exercises     Assessment/Plan    PT Assessment Patient needs continued PT services  PT  Problem List Decreased strength;Decreased mobility;Decreased safety awareness;Decreased activity tolerance;Decreased cognition;Decreased balance;Decreased knowledge of use of DME;Pain;Decreased range of motion;Decreased knowledge of precautions       PT Treatment Interventions DME instruction;Therapeutic activities;Gait training;Therapeutic exercise;Patient/family education;Balance training;Stair training;Functional mobility training;Wheelchair mobility training    PT Goals (Current goals can be found in the Care Plan section)  Acute Rehab PT Goals Patient Stated Goal: go home PT Goal Formulation: With patient/family Time For Goal Achievement: 12/09/19 Potential to Achieve Goals: Fair    Frequency Min 5X/week   Barriers to discharge Decreased caregiver support;Inaccessible home environment pt has stairs to enter home and pt's spouse is only person at home to assist, spouse stating he was going to try and get neighbors to assist as well    Co-evaluation               AM-PAC PT "6 Clicks" Mobility  Outcome Measure Help needed turning from your back to your side while in a flat bed without using bedrails?: A Lot Help needed moving from lying on your back to sitting on the side of a flat bed without using bedrails?: A Lot Help needed moving to and from a bed to a chair (including a wheelchair)?: A Lot Help needed standing up from a chair using your arms (e.g., wheelchair or bedside chair)?: A Lot Help needed to walk in hospital room?: Total Help needed climbing 3-5 steps with a railing? : Total 6 Click Score: 10    End of Session Equipment Utilized During Treatment: Gait belt;Oxygen Activity Tolerance: Patient limited by fatigue;Patient limited by pain Patient left: in bed;with call bell/phone within reach;with bed alarm set;with family/visitor present Nurse Communication: Mobility status;Patient requests pain meds PT Visit Diagnosis: Difficulty in walking, not elsewhere  classified (R26.2);Other abnormalities of gait and mobility (R26.89);Muscle weakness (generalized) (M62.81);Unsteadiness on feet (R26.81)    Time: 3735-7897 PT Time Calculation (min) (ACUTE ONLY): 77 min   Charges:   PT Evaluation $PT Eval High Complexity: 1 High PT Treatments $Therapeutic Activity: 8-22 mins $Self Care/Home Management: 23-37        Zachary George PT, DPT 2:16 PM,11/25/19   Aswad Wandrey Drucilla Chalet 11/25/2019, 2:16 PM

## 2019-11-25 NOTE — TOC Initial Note (Addendum)
Transition of Care [facility]) - Initial/Assessment Note    Patient Details  Name: [name] [name] MRN: [id number] Date of Birth: [birth year]  Transition [facility] CM/SW Contact:    Midge Minium MSN, RN, NCM-BC, ACM-RN [phone] Phone Number: 11/25/2019, 4:10 PM  Clinical Narrative:                 CM following for dispositional needs. CM met with the patient/spouse to discuss the POC. The patient lived at home with her spouse PTA; high readmission rate with frequent readmissions. 68 yo female admitted post mechanical fall with LOC, CT of brain did not show acute abnormalities, xrays revealed acute displaced distal radius fx of left wrist and inferior and superior pubic left ramus fx.Patient is open to [facility] for home oxygen at 3LPM. PT/OT eval completed with CIR recommended; CM discussed the recommendations with the patient/spouse opting to transition home with HH/DME. Patient qualifies for [facility], with the program discussed, with both interested in the increased HHA support post-discharge. Patient/spouse wants to continue with [facility] for their DME needs, with the equipment to be delivered to the patients room prior to discharge. CM discussed transportation needs, with the spouse declining ambulance transportation, stating his family will assist with the patients transfer from the vehicle at home. CM team will continue to follow.   Expected Discharge Plan: [place] Barriers to Discharge: Continued Medical Work up   Patient Goals and CMS Choice Patient states their goals for this hospitalization and ongoing recovery are:: "I want to go home, not to rehab" CMS Medicare.gov Compare Post Acute Care list provided to:: Patient Choice offered to / list presented to : Patient  Expected Discharge Plan and Services Expected Discharge Plan: [place] In-house Referral: [facility] Discharge Planning Services: CM Consult Post Acute Care Choice: Durable Medical  Equipment, Home Health Living arrangements for the past 2 months: [place]                 DME Arranged: Bedside commode, Wheelchair manual(transfer board; platform for rolling walker) DME Agency: [facility] Date DME Agency Contacted: 11/25/19 Time DME Agency Contacted: 1600 Representative spoke with at DME Agency: [place] (liaison) [place] Arranged: RN, Refused SNF, OT, PT, Disease Management, Nurse's Aide [place] Agency: [place] Date [place]: 11/25/19 Time [place]: [place] Representative spoke with at [place]: [place] (liaison)  Prior Living Arrangements/Services Living arrangements for the past 2 months: [place] with:: Self, Spouse Patient language and need for interpreter reviewed:: No Do you feel safe going back to the place where you live?: Yes      Need for Family Participation in Patient Care: Yes (Comment) Care giver support system in place?: Yes (comment) Current home services: DME Criminal Activity/Legal Involvement Pertinent to Current Situation/Hospitalization: No - Comment as needed  Activities of Daily Living Home Assistive Devices/Equipment: Cane (specify quad or straight) ADL Screening (condition at time of admission) Patient's cognitive ability adequate to safely complete daily activities?: Yes Is the patient deaf or have difficulty hearing?: No Does the patient have difficulty seeing, even when wearing glasses/contacts?: No Does the patient have difficulty concentrating, remembering, or making decisions?: No Patient able to express need for assistance with ADLs?: No Does the patient have difficulty dressing or bathing?: Yes Independently performs ADLs?: No Does the patient have difficulty walking or climbing stairs?: Yes Weakness of Legs: Both Weakness of Arms/Hands: Both  Permission Sought/Granted Permission sought  to share information with : Case Manager, Customer service manager, Family  Supports Permission granted to share information with : Yes, Verbal Permission Granted  Share Information with NAME: Alexine Pilant  Permission granted to share info w AGENCY: [facility] and [facility]  Permission granted to share info w Relationship: spouse  Permission granted to share info w Contact Information: [phone]  Emotional Assessment Appearance:: Appears stated age Attitude/Demeanor/Rapport: Gracious Affect (typically observed): Pleasant, Accepting Orientation: : Oriented to Self, Oriented to Place, Oriented to  Time, Oriented to Situation Alcohol / Substance Use: Alcohol Use Psych Involvement: No (comment)  Admission diagnosis:  Fall, initial encounter [W19.XXXA] Patient Active Problem List   Diagnosis Date Noted  . Wrist fracture 11/24/2019  . Multiple closed stable fractures of pubic ramus ([place]) 11/23/2019  . Distal radius fracture, left 11/23/2019  . Fall at home, initial encounter 11/23/2019  . AMS (altered mental status) 11/12/2019  . Altered mental status 11/11/2019  . Hypomagnesemia 11/11/2019  . Osteoporosis 01/22/2019  . Hypothyroidism 06/16/2018  . Pulmonary hypertension associated with systemic disorder ([place]) 10/08/2017  . Dry gangrene ([place]) 09/11/2017  . Medication reaction 09/11/2017  . ILD (interstitial lung disease) ([place]) 07/22/2017  . Chronic respiratory failure ([place]) 10/31/2016  . Atherosclerosis of native arteries of right leg with ulceration of unspecified site ([place]) 09/14/2016  . Prolonged Q-T interval on ECG 07/29/2016  . CKD (chronic kidney disease), stage III 07/29/2016  . Scleroderma ([place]) 05/06/2016  . Depression 04/24/2016  . Hypokalemia 10/07/2015  . 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 Allergic rhinitis 03/08/2015  . Varicose veins of lower extremities with ulcer and inflammation ([place]) 07/24/2012  . 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[place]) 04/08/2012  . Pulmonary hypertension associated with systemic  disorder ([place]) 03/25/2012  . Peripheral arterial occlusive disease ([place]) 08/10/2011  . Essential hypertension 08/10/2011  . Hyperlipidemia 08/10/2011   PCP:  [name], MD Pharmacy:   [id number] [place] [place]Phone: [phone]: [address] [place] [place] [address]Phone: [phone]: [phone]    Social Determinants of Health (SDOH) Interventions    Readmission Risk Interventions No flowsheet data found.

## 2019-11-25 NOTE — Plan of Care (Signed)

## 2019-11-25 NOTE — Progress Notes (Signed)
CRITICAL VALUE ALERT  Critical Value:  Hgb 5.9  Date & Time Notied:  11/25/19 0400  Provider Notified: Lorra Hals  Orders Received/Actions taken:

## 2019-11-25 NOTE — Progress Notes (Signed)
Progress Note    [name] [name]  NLZ:[id number] DOB: 06/25/1951  DOA: 11/23/2019 PCP: [name], MD    Brief Narrative:     Medical records reviewed and are as summarized below:  [name] [name] is an 68 y.o. 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  She presents after having a fall at home while walking into the house coming from wound care center.  She is unsure if she passed out or tripped.  States that she has been feeling a little lightheaded.  Her husband reports he was at the car with his back turned at the time and heard her fall.  There after she was unable to stand up or bear weight due to pain.  Denies having any chest pain, worsening shortness of breath, abdominal pain, nausea, vomiting, or fevers.  She had just recently been discharged home 1 week ago after presenting for dehydration with multiple electrolyte abnormalities related to diarrhea.  When she got home she reports that she had been trying to take magnesium supplements, but reported that it caused worsening of her diarrhea.  Husband reports that they had just been trying to eat foods rich in magnesium instead.  Assessment/Plan:   Principal Problem:   Multiple closed stable fractures of pubic ramus (HCC) Active Problems:   Peripheral arterial occlusive disease (HCC)   Essential hypertension   Hyperlipidemia   Hypokalemia   Depression   Scleroderma (HCC)   Prolonged Q-T interval on ECG   CKD (chronic kidney disease), stage III   Chronic respiratory failure (HCC)   Pulmonary hypertension associated with systemic disorder (HCC)   Hypothyroidism   Hypomagnesemia   Distal radius fracture, left   Fall at home, initial encounter   Wrist fracture    Pubic ramus fractures and displaced  distal radius fracture secondary to fall: Patient presents after having a fall at home coming from the wound care center.  Imaging studies revealed left pubic ramus fractures and left distal radius fracture.  Dr. Mardelle Matte of orthopedics was consulted. -Pain control -s/p ORIF today.  Left superior and inferior pubic rami fractures - this will likely to handled non-operatively -PT/OT eval  Anemia -suspect the AM reading may not have been correct (5.6 or 6.8) -was given 1 unit PRBC and Hgb went to 9.5  Hypomagnesemia: Acute on chronic. On admission magnesium 0.8.  Patient was given 2 g of magnesium sulfate IV in the ED. She reports inability to take oral magnesium replacement due to diarrhea. - repleted  Hypokalemia: Acute on chronic.   -repleted  Prolonged QT interval: Acute.  On admission QT interval 527 now 468 -Hold QT prolonging medication -Correct electrolyte abnormality  Chronic respiratory failure with hypoxia due to pulmonary hypertension: At home patient on 2 to 3 L depending on her activity level.  O2 saturations maintained on her home 2 to 3 L. -Continue nasal cannula oxygen of 2 to 3 L  Chronic kidney disease stage III: Patient creatinine appears relatively stable at 1.33. -Continue to monitor  Pulmonary artery hypertension: Also likely related to history of scleroderma. -Continue Opsumit  Hypothyroidism: Last TSH within normal limits at 1.975 on 11/11/2019. -Continue levothyroxine   Peripheral vascular disease secondary to scleroderma -Continue Plavix and statin  Depression -Held Celexa due to prolonged QTC  Hyperlipidemia -Continue atorvastatin   Neuropathic ulcer: Patient  currently followed in outpatient setting by wound care. -Continue routine wound care  GERD -Held Protonix due to prolonged QT  Current alcohol use -CIWA  Family Communication/Anticipated D/C date and plan/Code Status   DVT prophylaxis: per ortho Code Status: Full Code.   Family Communication:  Disposition Plan: PT/OT eval   Medical Consultants:    orthopedics  Subjective:   Has not had breakfast this AM  Objective:    Vitals:   11/25/19 0342 11/25/19 0646 11/25/19 0723 11/25/19 0934  BP: (!) 126/52 (!) 127/38 (!) 124/55 (!) 146/66  Pulse: 61 61 61 61  Resp: _0 Temp: 98 F (36.7 C) 98.4 F (36.9 C) 98.5 F (36.9 C) 98.6 F (37 C)  TempSrc: Oral Oral Oral Oral  SpO2: (!) 52% 94% (!) 3% 96%  Weight:      Height:        Intake/Output Summary (Last 24 hours) at 11/25/2019 1241 Last data filed at 11/25/2019 0934 Gross per 24 hour  Intake 1547.97 ml  Output 1020 ml  Net 527.97 ml   Filed Weights   11/23/19 1337  Weight: 61.7 kg    Exam: In bed, more comfortable appearing rrr No increased work of breathing alert  Data Reviewed:   I have personally reviewed following labs and imaging studies:  Labs: Labs show the following:   Basic Metabolic Panel: Recent Labs  Lab 11/23/19 1455 11/24/19 0251 11/25/19 0445  NA 133* 135 135  K 3.2* 3.8 4.0  CL 92* 97* 99  CO2 _1 GLUCOSE 124* 94 138*  BUN _2 CREATININE 1.33* 1.24* 0.89  CALCIUM 8.0* 7.8* 7.1*  MG 0.8* 1.6* 1.8  PHOS 4.5  --   --    GFR Estimated Creatinine Clearance: 57.3 mL/min (by C-G formula based on SCr of 0.89 mg/dL). Liver Function Tests: Recent Labs  Lab 11/23/19 1455  AST 45*  ALT 41  ALKPHOS 130*  BILITOT 0.6  PROT 6.9  ALBUMIN 3.3*   No results for input(s): LIPASE, AMYLASE in the last 168 hours. No results for input(s): AMMONIA in the last 168 hours. Coagulation profile No results for input(s): INR, PROTIME in the last 168 hours.  CBC: Recent Labs  Lab 11/23/19 1411 11/24/19 0251 11/25/19 0258 11/25/19 0445 11/25/19 1031  WBC 7.6 7.7 7.7 12.6*  --   NEUTROABS 6.6  --   --   --   --   HGB 10.7* 9.3* 5.9* 6.8* 9.5*  HCT 33.8* 29.4* 19.4* 21.9* 30.7*  MCV 102.4* 103.5* 107.8* 104.8*  --   PLT 299 308 187 319   --    Cardiac Enzymes: No results for input(s): CKTOTAL, CKMB, CKMBINDEX, TROPONINI in the last 168 hours. BNP (last 3 results) No results for input(s): PROBNP in the last 8760 hours. CBG: No results for input(s): GLUCAP in the last 168 hours. D-Dimer: No results for input(s): DDIMER in the last 72 hours. Hgb A1c: No results for input(s): HGBA1C in the last 72 hours. Lipid Profile: No results for input(s): CHOL, HDL, LDLCALC, TRIG, CHOLHDL, LDLDIRECT in the last 72 hours. Thyroid function studies: No results for input(s): TSH, T4TOTAL, T3FREE, THYROIDAB in the last 72 hours.  Invalid input(s): FREET3 Anemia work up: No results for input(s): VITAMINB12, FOLATE, FERRITIN, TIBC, IRON, RETICCTPCT in the last 72 hours. Sepsis Labs: Recent Labs  Lab 11/23/19 1411 11/24/19 0251 11/25/19 0258 11/25/19 0445  WBC 7.6 7.7 7.7 12.6*    Microbiology Recent  Results (from the past 240 hour(s))  SARS CORONAVIRUS 2 (TAT 6-24 HRS) Nasopharyngeal Nasopharyngeal Swab     Status: None   Collection Time: 11/23/19  7:14 PM   Specimen: Nasopharyngeal Swab  Result Value Ref Range Status   SARS Coronavirus 2 NEGATIVE NEGATIVE Final    Comment: (NOTE) SARS-CoV-2 target nucleic acids are NOT DETECTED. The SARS-CoV-2 RNA is generally detectable in upper and lower respiratory specimens during the acute phase of infection. Negative results do not preclude SARS-CoV-2 infection, do not rule out co-infections with other pathogens, and should not be used as the sole basis for treatment or other patient management decisions. Negative results must be combined with clinical observations, patient history, and epidemiological information. The expected result is Negative. Fact Sheet for Patients: SugarRoll.be Fact Sheet for Healthcare Providers: https://www.woods-mathews.com/ This test is not yet approved or cleared by the [place] FDA and  has been authorized  for detection and/or diagnosis of SARS-CoV-2 by FDA under an Emergency Use Authorization (EUA). This EUA will remain  in effect (meaning this test can be used) for the duration of the COVID-19 declaration under Section 56 4(b)(1) of the Act, 21 U.S.C. section 360bbb-3(b)(1), unless the authorization is terminated or revoked sooner. Performed at [facility], [place] [address], [place], [place] [address]   Surgical pcr screen     Status: Abnormal   Collection Time: 11/23/19  9:35 PM   Specimen: Nasal Mucosa; Nasal Swab  Result Value Ref Range Status   MRSA, PCR NEGATIVE NEGATIVE Final   Staphylococcus aureus POSITIVE (A) NEGATIVE Final    Comment: (NOTE) The Xpert SA Assay (FDA approved for NASAL specimens in patients 32 years of age and older), is one component of a comprehensive surveillance program. It is not intended to diagnose infection nor to guide or monitor treatment. Performed at [facility], [place] [address] [place], [place] [address]     Procedures and diagnostic studies:  Dg Wrist 2 Views Left  Result Date: 11/24/2019 CLINICAL DATA:  Open reduction, internal fixation. EXAM: LEFT WRIST - 2 VIEW COMPARISON:  November 23, 1999 FINDINGS: The patient has undergone ORIF of the left distal radius. The alignment is improved. The hardware is intact. There are expected postsurgical changes including subcutaneous gas and overlying soft tissue edema. IMPRESSION: Status post ORIF of the left distal radius without evidence of complicating feature. Electronically Signed   By: Constance Holster M.D.   On: 11/24/2019 18:08   Dg Wrist Complete Left  Result Date: 11/24/2019 CLINICAL DATA:  Status post ORIF of left wrist fracture EXAM: LEFT WRIST - COMPLETE 3+ VIEW COMPARISON:  Film from earlier in the same day. FINDINGS: Fixation sideplate is seen along the distal radius similar to that noted on the intraoperative films. The fracture fragments are in near anatomic alignment. No  new fracture is seen. Degenerative changes of the first [facility] joint are noted. Soft tissue calcifications are seen within the fingers but incompletely evaluated on this exam. This is related to the underlying history of scleroderma. IMPRESSION: Status post reduction and fixation of a distal left radial fracture. Soft tissue calcifications in the digits are noted related to the underlying scleroderma. Electronically Signed   By: Inez Catalina M.D.   On: 11/24/2019 19:52   Dg Wrist Complete Left  Result Date: 11/23/2019 CLINICAL DATA:  Fall EXAM: LEFT WRIST - COMPLETE 3+ VIEW COMPARISON:  None. FINDINGS: Bones appear osteopenic. Acute impacted fracture involving the distal radius with mild dorsal displacement  of distal fracture fragment. No dislocation. Radial subluxation base of the first metacarpal, uncertain chronicity. Soft tissue calcifications at the digits. IMPRESSION: 1. Acute impacted and mildly displaced distal radius fracture 2. Radial subluxation base of the first metacarpal of uncertain chronicity Electronically Signed   By: Donavan Foil M.D.   On: 11/23/2019 15:42   Ct Head Wo Contrast  Result Date: 11/23/2019 CLINICAL DATA:  Head trauma. Patient on anticoagulation. Hematoma to the left eye. EXAM: CT HEAD WITHOUT CONTRAST TECHNIQUE: Contiguous axial images were obtained from the base of the skull through the vertex without intravenous contrast. COMPARISON:  November 11, 2019. FINDINGS: Brain: No evidence of acute infarction, hemorrhage, hydrocephalus, extra-axial collection or mass lesion/mass effect. Chronic microvascular ischemic changes are again noted. There are postsurgical changes of the posterior right cerebellum. Vascular: No hyperdense vessel or unexpected calcification. Skull: Normal. Negative for fracture or focal lesion. Sinuses/Orbits: No acute finding. There is left periorbital soft tissue swelling without evidence for an underlying fracture. Other: None. IMPRESSION: No acute  intracranial abnormality. Chronic findings as detailed above, not significantly changed from recent prior CT. Electronically Signed   By: Constance Holster M.D.   On: 11/23/2019 15:07   Dg Shoulder Left  Result Date: 11/23/2019 CLINICAL DATA:  Fall with shoulder pain EXAM: LEFT SHOULDER - 2+ VIEW COMPARISON:  None. FINDINGS: No fracture or malalignment.  AC joint appears intact. IMPRESSION: No acute osseous abnormality. Electronically Signed   By: Donavan Foil M.D.   On: 11/23/2019 15:40   Dg C-arm 1-60 Min  Result Date: 11/24/2019 CLINICAL DATA:  Open reduction, internal fixation of the left wrist. EXAM: DG C-ARM 1-60 MIN FLUOROSCOPY TIME:  Fluoroscopy Time:  14 seconds Number of Acquired Spot Images: 3 COMPARISON:  11/23/2019 FINDINGS: The patient has undergone ORIF of the left distal radius. The alignment is improved. The hardware is intact. There are expected postsurgical changes including subcutaneous gas and overlying soft tissue edema. IMPRESSION: Status post ORIF of the left distal radius. Electronically Signed   By: Constance Holster M.D.   On: 11/24/2019 18:08   Dg Hip Unilat W Or Wo Pelvis 2-3 Views Left  Result Date: 11/23/2019 CLINICAL DATA:  Fall EXAM: DG HIP (WITH OR WITHOUT PELVIS) 2-3V LEFT COMPARISON:  CT 11/08/2019 FINDINGS: Acute displaced fractures involving the left superior and inferior pubic rami. The left femoral head is normally position. There are vascular calcifications. IMPRESSION: Acute displaced fractures involving the left superior and inferior pubic rami. Electronically Signed   By: Donavan Foil M.D.   On: 11/23/2019 15:39    Medications:   . acetaminophen  500 mg Oral Q12H  . atorvastatin  20 mg Oral q1800  . bacitracin   Topical BID  . cholecalciferol  2,000 Units Oral Daily  . clopidogrel  75 mg Oral Daily  . cycloSPORINE  1 drop Both Eyes BID  . folic acid  1 mg Oral Daily  . iron polysaccharides  150 mg Oral BID  . levothyroxine  50 mcg Oral Q0600   . macitentan  10 mg Oral Daily  . multivitamin with minerals  1 tablet Oral Daily  . nystatin  5 mL Oral QID  . saccharomyces boulardii  250 mg Oral BID  . sodium chloride flush  3 mL Intravenous Q12H  . thiamine  100 mg Oral Daily   Or  . thiamine  100 mg Intravenous Daily   Continuous Infusions: . sodium chloride 75 mL/hr at 11/24/19 1758     LOS: 1  day   [facility]   How to contact the [facility] Attending or Consulting provider [place] or covering provider during after hours [place], for this patient?  1. Check the care team in [facility] and look for a) attending/consulting [facility] provider listed and b) the [facility] team listed 2. Log into [email or web address] and use [facility]'s universal password to access. If you do not have the password, please contact the hospital operator. 3. Locate the [facility] provider you are looking for under [facility] and page to a number that you can be directly reached. 4. If you still have difficulty reaching the provider, please page the [facility] (Director on Call) for the Hospitalists listed on amion for assistance.  11/25/2019, 12:41 PM

## 2019-11-26 DIAGNOSIS — S32592A Other specified fracture of left pubis, initial encounter for closed fracture: Secondary | ICD-10-CM | POA: Diagnosis not present

## 2019-11-26 DIAGNOSIS — N1831 Chronic kidney disease, stage 3a: Secondary | ICD-10-CM | POA: Diagnosis not present

## 2019-11-26 DIAGNOSIS — J9611 Chronic respiratory failure with hypoxia: Secondary | ICD-10-CM | POA: Diagnosis not present

## 2019-11-26 DIAGNOSIS — S52502A Unspecified fracture of the lower end of left radius, initial encounter for closed fracture: Secondary | ICD-10-CM | POA: Diagnosis not present

## 2019-11-26 LAB — BPAM RBC
Blood Product Expiration Date: 202012192359
ISSUE DATE / TIME: 202011250659
Unit Type and Rh: 7300

## 2019-11-26 LAB — BASIC METABOLIC PANEL
Anion gap: 10 (ref 5–15)
BUN: 8 mg/dL (ref 8–23)
CO2: 26 mmol/L (ref 22–32)
Calcium: 7.3 mg/dL — ABNORMAL LOW (ref 8.9–10.3)
Chloride: 103 mmol/L (ref 98–111)
Creatinine, Ser: 0.73 mg/dL (ref 0.44–1.00)
GFR calc Af Amer: >60 mL/min (ref >60)
GFR calc non Af Amer: >60 mL/min (ref >60)
Glucose, Bld: 88 mg/dL (ref 70–99)
Potassium: 3.7 mmol/L (ref 3.5–5.1)
Sodium: 139 mmol/L (ref 135–145)

## 2019-11-26 LAB — TYPE AND SCREEN
ABO/RH(D): B POS
Antibody Screen: NEGATIVE
Unit division: 0

## 2019-11-26 LAB — CBC
HCT: 30.1 % — ABNORMAL LOW (ref 36.0–46.0)
Hemoglobin: 9.6 g/dL — ABNORMAL LOW (ref 12.0–15.0)
MCH: 31.8 pg (ref 26.0–34.0)
MCHC: 31.9 g/dL (ref 30.0–36.0)
MCV: 99.7 fL (ref 80.0–100.0)
Platelets: 263 10*3/uL (ref 150–400)
RBC: 3.02 MIL/uL — ABNORMAL LOW (ref 3.87–5.11)
RDW: 16.7 % — ABNORMAL HIGH (ref 11.5–15.5)
WBC: 7.8 10*3/uL (ref 4.0–10.5)
nRBC: 0 % (ref 0.0–0.2)

## 2019-11-26 LAB — VITAMIN D 25 HYDROXY (VIT D DEFICIENCY, FRACTURES): Vit D, 25-Hydroxy: 55.97 ng/mL (ref 30–100)

## 2019-11-26 LAB — MAGNESIUM: Magnesium: 1.7 mg/dL (ref 1.7–2.4)

## 2019-11-26 MED ORDER — MAGNESIUM SULFATE 2 GM/50ML IV SOLN
2.0000 g | Freq: Once | INTRAVENOUS | Status: DC
  Filled 2019-11-26: qty 50

## 2019-11-26 MED ORDER — FOLIC ACID 1 MG PO TABS: 1.0000 mg | ORAL_TABLET | Freq: Every day | ORAL | 1 refills | Status: DC

## 2019-11-26 NOTE — Progress Notes (Signed)
Physical Therapy Discharge Patient Details Name: [name] [name] MRN: [id number] DOB: May 23, 1951 Today's Date: 11/26/2019 Time: 1112-1200 PT Time Calculation (min) (ACUTE ONLY): 48 min  Patient discharged from PT services secondary to goals met and no further PT needs identified.  Please see latest therapy progress note for current level of functioning and progress toward goals.    Progress and discharge plan discussed with patient and/or caregiver: Patient/Caregiver agrees with plan  GP    Silvana Newness, SPT  Silvana Newness 11/26/2019, 1:36 PM

## 2019-11-26 NOTE — Progress Notes (Signed)
Dr. Cyndia Skeeters notified patient does not have IV access, asked if he wanted PO mag given, stated to just cancel/patient has home script for magnesium.

## 2019-11-26 NOTE — Progress Notes (Addendum)
Physical Therapy Treatment Patient Details Name: [name] [name] MRN: [id number] DOB: 09/01/1951 Today's Date: 11/26/2019    History of Present Illness 68 yo female admitted post mechanical fall with LOC, CT of brain did not show acute abnormalities, xrays revealed acute displaced distal radius fx of left wrist and inferior and superior pubic left ramus fx. 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 Pt equipment present in the room. Today, educated husband on transfers and wheelchair components. Husband completed transfers with PT/SPT there for safety and guidance. Husband demonstrated good body mechanics throughout and has good safety awareness of how to complete pt's transfers at home. Husband had tendency to want to pull on pt's R arm. However, demonstrated and encouraged him to use gait belt. He cued pt correctly, and pt able to complete stand pivot transfer to wheelchair with platform walker and lateral scoot transfer to wheelchair with mod A. Educated husband on stair navigation with wheelchair, and he notes that a neighbor will be able to help as well. Gave pt and husband How to Build Ramp and Stairs with [employer]. Pt's acute PT needs have been met and she is d/c from acute PT services at this time. Husband seems adequately prepared to assist pt at home. Therefore, follow-up recommendations have been changed to [facility] PT.    Follow Up Recommendations  Home health PT; Home health OT; Aide     Equipment Recommendations  Wheelchair (measurements PT);Wheelchair cushion (measurements PT);Other (comment); Sliding board; L Quarry manager   Recommendations for Other Services       Precautions / Restrictions Precautions Precautions: Fall Required Braces or Orthoses:  Splint/Cast Splint/Cast: L UE Restrictions Weight Bearing Restrictions: Yes LUE Weight Bearing: Non weight bearing RLE Weight Bearing: Weight bearing as tolerated LLE Weight Bearing: Weight bearing as tolerated    Mobility  Bed Mobility Overal bed mobility: Needs Assistance Bed Mobility: Supine to Sit     Supine to sit: Mod assist     General bed mobility comments: per husband: mod A for trunk elevation and hip scooting  Transfers Overall transfer level: Needs assistance Equipment used: Left platform walker;Sliding board Transfers: Lateral/Scoot Transfers;Stand Pivot Transfers;Sit to/from Stand Sit to Stand: Mod assist Stand pivot transfers: Mod assist      Lateral/Scoot Transfers: Mod assist;With slide board General transfer comment: pt required frequent cueing for sequencing and hand placement from husband throughout transfers. Husband able to safely transfer pt to and from W/C using both platform walker for stand pivot and slide board lateral scoot with mod A  Ambulation/Gait                 Stairs             Wheelchair Mobility    Modified Rankin (Stroke Patients Only)       Balance Overall balance assessment: Needs assistance Sitting-balance support: Feet supported;Single extremity supported Sitting balance-Leahy Scale: Fair Sitting balance - Comments: reliant on R UE support in sitting   Standing balance support: Bilateral upper extremity supported;During functional activity Standing balance-Leahy Scale: Poor Standing balance comment: reliant on RW + platform and husband                            Cognition Arousal/Alertness: Awake/alert  Behavior During Therapy: WFL for tasks assessed/performed Overall Cognitive Status: Within Functional Limits for tasks assessed                                        Exercises      General Comments        Pertinent Vitals/Pain Pain Assessment: Faces Faces Pain Scale:  Hurts a little bit Pain Location: LLE Pain Descriptors / Indicators: Grimacing;Guarding Pain Intervention(s): Limited activity within patient's tolerance;Monitored during session;Repositioned    Home Living                      Prior Function            PT Goals (current goals can now be found in the care plan section) Acute Rehab PT Goals Patient Stated Goal: go home Progress towards PT goals: Goals met/education completed, patient discharged from PT    Frequency    Min 5X/week      PT Plan Discharge plan needs to be updated    Co-evaluation              AM-PAC PT "6 Clicks" Mobility   Outcome Measure  Help needed turning from your back to your side while in a flat bed without using bedrails?: A Lot Help needed moving from lying on your back to sitting on the side of a flat bed without using bedrails?: A Lot Help needed moving to and from a bed to a chair (including a wheelchair)?: A Lot Help needed standing up from a chair using your arms (e.g., wheelchair or bedside chair)?: A Lot Help needed to walk in hospital room?: Total Help needed climbing 3-5 steps with a railing? : Total 6 Click Score: 10    End of Session Equipment Utilized During Treatment: Gait belt;Oxygen Activity Tolerance: Patient limited by fatigue Patient left: with call bell/phone within reach;in chair;with family/visitor present Nurse Communication: Mobility status PT Visit Diagnosis: Difficulty in walking, not elsewhere classified (R26.2);Other abnormalities of gait and mobility (R26.89);Muscle weakness (generalized) (M62.81);Unsteadiness on feet (R26.81)     Time: 1112-1200 PT Time Calculation (min) (ACUTE ONLY): 48 min  Charges:  $Therapeutic Activity: 23-37 mins $Self Care/Home Management: 8-22                    Silvana Newness, SPT    Silvana Newness 11/26/2019, 1:45 PM

## 2019-11-26 NOTE — Discharge Summary (Signed)
Physician Discharge Summary  [name] [name] GOT:[id number] DOB: 1951-08-07 DOA: 11/23/2019  PCP: [name] Showers, MD  Admit date: 11/23/2019 Discharge date: 11/26/2019  Admitted From: Home Disposition: Home  Recommendations for Outpatient Follow-up:  1. Follow up as below. 2. Please obtain CBC/BMP/Mag at follow up 3. Please follow up on the following pending results: None  Home Health: PT/OT Equipment/Devices: Rolling walker, lightweight manual wheelchair with cushion, platform for rolling walker, transfer board, bedside commode  Discharge Condition: Stable CODE STATUS: Full code  Follow-up Information    [name] Palmas, MD. Schedule an appointment as soon as possible for a visit on 12/07/2019.   Specialty: Orthopedic Surgery Contact information: [place] [address] [place], [facility] Follow up.   Specialty: Home Health Services Why: Home Health registered nurse, physical and occupational and home health aide Contact information: [place] [address] [phone]        [facility] Follow up.   Why: wheelchair, transfer board, drop-arm bedside commode and platform for your rolling walker Contact information: [place] [address] [phone]        [facility] Follow up.   Why: Comptroller information: [place]. [place] [phone]          Hospital Course: [name] is an 68 y.o. female with medical history significant ofnon-Hodgkin's lymphoma of the brains/pchemotherapy, scleroderma with PVD, hypertension, chronic pulmonary hypertension, chronic respiratory failure on 2-3Lnasal cannula oxygen, hypothyroidism, and chronic leg ulcers. She presents after having a fall at home while walking into the house coming from wound care center. She is unsure if she passed  out or tripped. States that she has been feeling a little lightheaded. Her husband reports he was at the car with his back turned at the time and heard her fall. There after she was unable to stand up or bear weight due to pain. Denies having any chest pain, worsening shortness of breath, abdominal pain, nausea, vomiting, or fevers.  She had just recently been discharged home 1 week ago after presenting for dehydration with multiple electrolyte abnormalities related to diarrhea. When she got home she reports that she had been trying to take magnesium supplements, but reported that it caused worsening of her diarrhea. Husband reports that they had just been trying to eat foods rich in magnesium instead.  Patient admitted for fall at home and left pubic ramus fracture and left distal radius fracture and electrolyte derangement.  She had QTC prolongation on EKG likely from electrolyte derangement.  She was taken to the OR by orthopedic surgery and had ORIF of left distal radius fracture by Dr. Mayford [name] on 11/24/2019.  Orthopedic surgery recommended nonsurgical treatment of left pubic ramus fracture.   Patient's electrolyte regiment resolved after aggressive repletion.   Patient was evaluated by PT/OT who recommended CIR.  However, patient declined CIR chose to be discharged home with home health and DME.  See individual problems below for more hospital course.  Discharge Diagnoses:  Fall at home-likely mechanical but also reports some lightheadedness. Left pubic ramus fractures Displaced left distal radius fracturesecondary to fall -ORIF of left distal radius fracture by orthopedic surgery, Dr. Mardelle [name] on 11/24/2019.   -Nonoperative management for left pubic ramus fracture.   -Patient declined CIR and discharged home with home health and DME as above -Outpatient follow-up as above.  Acute blood loss anemia on anemia of chronic disease: Hgb 8-10 (baseline)> 10.7 (admit)> 5.9 (postop)> 1 unit>  9.5>> 9.6.  Suspect some element of hemoconcentration on admit and hemodilution postop.  Discharged on iron and folic acid. -Protect discharge follow-up.  Acute on chronic hypomagnesemia: 0.8 on admissionmagnesium.  Received 2 g of magnesium sulfate in ED. magnesium stable on the day of discharge.  Discharged on oral magnesium.   Hypokalemia: Replenished and resolved.  Prolonged QT interval: Acute. On admission QT interval 527 now 468.  -Avoid QT prolonging drugs  Chronic respiratory failure with hypoxia due to pulmonary hypertension: At home patient on 2 to 3 L depending on her activity level. O2 saturations maintained on her home 2 to 3 L. -Continue nasal cannula oxygenof 2 to 3 L  Chronic kidney disease stageIII:Patient creatinine appears relatively stable at 1.33. -Recheck at follow-up  Pulmonary artery hypertension: Also likely related to history of scleroderma. -Continue Opsumit  Hypothyroidism:Last TSH within normal limits at 1.975 on 11/11/2019. -Continue levothyroxine  Peripheral vascular diseasesecondary to scleroderma -Continue Plavixand statin  Depression: Stable -Discharged on home medications.  Hyperlipidemia -Continue atorvastatin   Neuropathic ulcer: Patient currently followed in outpatient setting by wound care. -Continue routine wound care outpatient  GERD -Discharged on Protonix  Current alcohol use: Stable -CIWA scores low. -Encourage moderation   Discharge Instructions  Discharge Instructions    Call MD for:  extreme fatigue   Complete by: As directed    Call MD for:  persistant dizziness or light-headedness   Complete by: As directed    Call MD for:  redness, tenderness, or signs of infection (pain, swelling, redness, odor or green/yellow discharge around incision site)   Complete by: As directed    Call MD for:  severe uncontrolled pain   Complete by: As directed    Call MD for:  temperature >100.4   Complete by: As  directed    Diet - low sodium heart healthy   Complete by: As directed    Discharge instructions   Complete by: As directed    It has been a pleasure taking care of you! You were hospitalized due to pelvic and left arm fracture.  You were treated surgically for left arm fracture.  We anticipate the pelvic fracture to heal on its own.  We have added some new medications or made some adjustment to your home medications. Please review your new medication list and the directions before you take your medications. Please go to your follow-up appointments as recommended.  You may have to call to schedule some of these follow-up appointments.   Take care,   Increase activity slowly   Complete by: As directed      Allergies as of 11/26/2019      Reactions   Levofloxacin Other (See Comments)   FLU LIKE SYMPTOM Other reaction(s): Other (See Comments) FLU LIKE SYMPTOM   Sulfa Antibiotics Rash, Other (See Comments)   Face peeled Severe rash with significant peeling of face. No airway involvement per patient Face peeled   Levofloxacin In D5w Other (See Comments)   FLU LIKE SYMPTOMS FLU LIKE SYMPTOMS   Doxycycline Hyclate Swelling   SWELLING REACTION UNSPECIFIED  "TABLETS ONLY - CAPSULES ARE TOLERATED FINE"   Penicillins Rash   Has patient had a PCN reaction causing immediate rash, facial/tongue/throat swelling, SOB or lightheadedness with hypotension: no Has patient had a PCN reaction causing severe rash involving mucus membranes or skin necrosis: no Has patient had a PCN reaction that  required hospitalization: no Has patient had a PCN reaction occurring within the last 10 years: {no If all of the above answers are "NO", then may proceed with Cephalosporin use.      Medication List    STOP taking these medications   Tylenol Arthritis Pain 650 MG CR tablet Generic drug: acetaminophen Replaced by: acetaminophen 500 MG tablet     TAKE these medications   acetaminophen 500 MG  tablet Commonly known as: TYLENOL Take 1 tablet (500 mg total) by mouth every 12 (twelve) hours. Replaces: Tylenol Arthritis Pain 650 MG CR tablet   atorvastatin 20 MG tablet Commonly known as: LIPITOR Take 1 tablet (20 mg total) by mouth daily.   Biotin 5000 MCG Tabs Take 5,000 mcg by mouth daily.   CALCIUM-MAGNESIUM-VITAMIN D PO Take 2 tablets by mouth daily.   citalopram 20 MG tablet Commonly known as: CELEXA TAKE 1 TABLET BY MOUTH EVERYDAY AT BEDTIME What changed: See the new instructions.   clopidogrel 75 MG tablet Commonly known as: PLAVIX TAKE 1 TABLET BY MOUTH EVERY DAY   diphenoxylate-atropine 2.5-0.025 MG tablet Commonly known as: LOMOTIL Take 1 tablet by mouth 4 (four) times daily as needed for diarrhea or loose stools.   Ferrex 150 150 MG capsule Generic drug: iron polysaccharides TAKE ONE CAPSULE BY MOUTH TWICE A DAY What changed: how much to take   fish oil-omega-3 fatty acids 1000 MG capsule Take 1,000 mg by mouth 2 (two) times daily.   folic acid 1 MG tablet Commonly known as: FOLVITE Take 1 tablet (1 mg total) by mouth daily. Start taking on: November 27, 2019   gabapentin 300 MG capsule Commonly known as: NEURONTIN Take 1 capsule (300 mg total) by mouth at bedtime.   levothyroxine 50 MCG tablet Commonly known as: SYNTHROID TAKE 1 TABLET BY MOUTH DAILY BEFORE BREAKFAST What changed: See the new instructions.   magnesium oxide 400 (241.3 Mg) MG tablet Commonly known as: MAG-OX Take 0.5 tablets (200 mg total) by mouth 2 (two) times daily.   multivitamin with minerals Tabs tablet Take 1 tablet by mouth daily.   nystatin 100000 UNIT/ML suspension Commonly known as: MYCOSTATIN TAKE 5 MLS (500,000 UNITS TOTAL) BY MOUTH 4 (FOUR) TIMES DAILY.   ondansetron 4 MG disintegrating tablet Commonly known as: Zofran ODT Take 1 tablet (4 mg total) by mouth every 8 (eight) hours as needed for nausea or vomiting.   Opsumit 10 MG tablet Generic drug:  macitentan Take 1 tablet (2m) by mouth daily.   oxyCODONE-acetaminophen 5-325 MG tablet Commonly known as: PERCOCET/ROXICET Take 1-2 tablets by mouth every 6 (six) hours as needed for moderate pain or severe pain.   OXYGEN Inhale 2-3 L into the lungs continuous.   pantoprazole 40 MG tablet Commonly known as: PROTONIX Take 1 tablet (40 mg total) by mouth 2 (two) times daily.   Probiotic Caps Take 1 capsule by mouth daily.   Restasis 0.05 % ophthalmic emulsion Generic drug: cycloSPORINE Place 1 drop into both eyes 2 (two) times daily.   saccharomyces boulardii 250 MG capsule Commonly known as: FLORASTOR Take 1 capsule (250 mg total) by mouth 2 (two) times daily.   valACYclovir 500 MG tablet Commonly known as: Valtrex Take 1 tablet (500 mg total) by mouth 2 (two) times daily. What changed:   when to take this  reasons to take this   vitamin B-12 1000 MCG tablet Commonly known as: CYANOCOBALAMIN Take 1,000 mcg by mouth daily.   Vitamin D 50 MCG (2000  UT) Caps Take 2,000 Units by mouth daily.            Durable Medical Equipment  (From admission, onward)         Start     Ordered   11/25/19 1607  For home use only DME Other see comment  Once    Comments: Transfer board 30 inches  Question:  Length of Need  Answer:  Lifetime   11/25/19 1607   11/25/19 1606  For home use only DME Other see comment  Once    Comments: Platform for rolling walker for left side  Question:  Length of Need  Answer:  Lifetime   11/25/19 1607   11/25/19 1553  For home use only DME lightweight manual wheelchair with seat cushion  Once    Comments: Patient suffers from a acute displaced distal radius fx of left wrist and inferior and superior pubic left ramus fx which impairs their ability to perform daily activities like toileting, feeding, dressing, grooming, bathing in the home.  A cane, walker, crutch will not resolve issue with performing activities of daily living. A wheelchair  will allow patient to safely perform daily activities. Patient is not able to propel themselves in the home using a standard weight wheelchair due to arm weakness, general weakness, endurance. Patient can self propel in the lightweight wheelchair.  Length of need: Lifetime  Accessories: elevating leg rests (ELRs), wheel locks, extensions and anti-tippers.   11/25/19 1552   11/25/19 1552  For home use only DME Walker rolling  Once    Comments: Platform walker  Question:  Patient needs a walker to treat with the following condition  Answer:  Fractures   11/25/19 1552   11/25/19 1551  For home use only DME Bedside commode  Once    Comments: Drop arm  Question:  Patient needs a bedside commode to treat with the following condition  Answer:  Fractures   11/25/19 1552          Consultations:  Orthopedic surgery  Procedures/Studies:  2D Echo: None  Ct Angio Head W Or Wo Contrast  Result Date: 11/11/2019 CLINICAL DATA:  Slurred speech and weakness.  Rule out stroke EXAM: CT ANGIOGRAPHY HEAD AND NECK TECHNIQUE: Multidetector CT imaging of the head and neck was performed using the standard protocol during bolus administration of intravenous contrast. Multiplanar CT image reconstructions and MIPs were obtained to evaluate the vascular anatomy. Carotid stenosis measurements (when applicable) are obtained utilizing NASCET criteria, using the distal internal carotid diameter as the denominator. CONTRAST:  189m OMNIPAQUE IOHEXOL 350 MG/ML SOLN COMPARISON:  CT head 11/11/2019 FINDINGS: CTA NECK FINDINGS Aortic arch: Atherosclerotic aortic arch. Aorta mass and mildly dilated. Ascending aorta 38 mm in diameter. Atherosclerotic disease in the proximal great vessels without significant stenosis. Right carotid system: Right common carotid artery widely patent. Atherosclerotic calcification right carotid bifurcation. Less than 25% diameter stenosis proximal right internal carotid artery. Left carotid system:  Left common carotid artery widely patent. Mild atherosclerotic calcification left carotid bifurcation without significant carotid stenosis. Vertebral arteries: Both vertebral arteries patent to the basilar. Mild-to-moderate stenosis distal right vertebral artery otherwise no vertebral stenosis. Skeleton: Marked kyphoscoliosis in the thoracic spine. No acute skeletal abnormality. Left occipital craniotomy and cranioplasty. Other neck: Negative for soft tissue mass. Upper chest: Apical scarring. Patchy airspace disease in the lung apices bilaterally which could be scarring or infection. 5 mm left upper lobe nodule Review of the MIP images confirms the above findings CTA HEAD  FINDINGS Anterior circulation: Atherosclerotic calcification in the cavernous carotid bilaterally. Mild to moderate stenosis in the cavernous carotid bilaterally. Anterior and middle cerebral arteries patent bilaterally without significant stenosis. Posterior circulation: Mild to moderate stenosis distal right vertebral artery. Left vertebral patent. PICA patent bilaterally. Basilar widely patent. Superior cerebellar and posterior cerebral arteries patent bilaterally. Venous sinuses: Patent Anatomic variants: None Review of the MIP images confirms the above findings IMPRESSION: 1. Atherosclerotic calcification carotid bifurcation without significant stenosis. Mild to moderate stenosis cavernous carotid bilaterally 2. Mild to moderate stenosis distal right vertebral artery. Left vertebral artery widely patent 3. No intracranial large vessel occlusion. Electronically Signed   By: Franchot Gallo M.D.   On: 11/11/2019 13:05   Dg Wrist 2 Views Left  Result Date: 11/24/2019 CLINICAL DATA:  Open reduction, internal fixation. EXAM: LEFT WRIST - 2 VIEW COMPARISON:  November 23, 1999 FINDINGS: The patient has undergone ORIF of the left distal radius. The alignment is improved. The hardware is intact. There are expected postsurgical changes including  subcutaneous gas and overlying soft tissue edema. IMPRESSION: Status post ORIF of the left distal radius without evidence of complicating feature. Electronically Signed   By: Constance Holster M.D.   On: 11/24/2019 18:08   Dg Wrist Complete Left  Result Date: 11/24/2019 CLINICAL DATA:  Status post ORIF of left wrist fracture EXAM: LEFT WRIST - COMPLETE 3+ VIEW COMPARISON:  Film from earlier in the same day. FINDINGS: Fixation sideplate is seen along the distal radius similar to that noted on the intraoperative films. The fracture fragments are in near anatomic alignment. No new fracture is seen. Degenerative changes of the first [facility] joint are noted. Soft tissue calcifications are seen within the fingers but incompletely evaluated on this exam. This is related to the underlying history of scleroderma. IMPRESSION: Status post reduction and fixation of a distal left radial fracture. Soft tissue calcifications in the digits are noted related to the underlying scleroderma. Electronically Signed   By: Inez Catalina M.D.   On: 11/24/2019 19:52   Dg Wrist Complete Left  Result Date: 11/23/2019 CLINICAL DATA:  Fall EXAM: LEFT WRIST - COMPLETE 3+ VIEW COMPARISON:  None. FINDINGS: Bones appear osteopenic. Acute impacted fracture involving the distal radius with mild dorsal displacement of distal fracture fragment. No dislocation. Radial subluxation base of the first metacarpal, uncertain chronicity. Soft tissue calcifications at the digits. IMPRESSION: 1. Acute impacted and mildly displaced distal radius fracture 2. Radial subluxation base of the first metacarpal of uncertain chronicity Electronically Signed   By: Donavan Foil M.D.   On: 11/23/2019 15:42   Ct Head Wo Contrast  Result Date: 11/23/2019 CLINICAL DATA:  Head trauma. Patient on anticoagulation. Hematoma to the left eye. EXAM: CT HEAD WITHOUT CONTRAST TECHNIQUE: Contiguous axial images were obtained from the base of the skull through the vertex  without intravenous contrast. COMPARISON:  November 11, 2019. FINDINGS: Brain: No evidence of acute infarction, hemorrhage, hydrocephalus, extra-axial collection or mass lesion/mass effect. Chronic microvascular ischemic changes are again noted. There are postsurgical changes of the posterior right cerebellum. Vascular: No hyperdense vessel or unexpected calcification. Skull: Normal. Negative for fracture or focal lesion. Sinuses/Orbits: No acute finding. There is left periorbital soft tissue swelling without evidence for an underlying fracture. Other: None. IMPRESSION: No acute intracranial abnormality. Chronic findings as detailed above, not significantly changed from recent prior CT. Electronically Signed   By: Constance Holster M.D.   On: 11/23/2019 15:07   Ct Head Wo Contrast  Result Date:  11/11/2019 CLINICAL DATA:  Altered mental status. EXAM: CT HEAD WITHOUT CONTRAST TECHNIQUE: Contiguous axial images were obtained from the base of the skull through the vertex without intravenous contrast. COMPARISON:  Brain MRI 11/29/2017 FINDINGS: Brain: Ventricles, cisterns and other CSF spaces are within normal. There is mild chronic ischemic microvascular disease. Stable focus of encephalomalacia over the right cerebellar hemisphere. No mass, mass effect, shift of midline structures or acute hemorrhage. No evidence of acute infarction. Vascular: No hyperdense vessel or unexpected calcification. Skull: Previous right occipital craniotomy unchanged. Sinuses/Orbits: No acute finding. Other: None. IMPRESSION: 1.  No acute findings. 2. Mild chronic ischemic microvascular disease. Postsurgical change right cerebellar hemisphere with postsurgical change of the right occipital skull. Electronically Signed   By: Marin Olp M.D.   On: 11/11/2019 11:57   Ct Angio Neck W Or Wo Contrast  Result Date: 11/11/2019 CLINICAL DATA:  Slurred speech and weakness.  Rule out stroke EXAM: CT ANGIOGRAPHY HEAD AND NECK TECHNIQUE:  Multidetector CT imaging of the head and neck was performed using the standard protocol during bolus administration of intravenous contrast. Multiplanar CT image reconstructions and MIPs were obtained to evaluate the vascular anatomy. Carotid stenosis measurements (when applicable) are obtained utilizing NASCET criteria, using the distal internal carotid diameter as the denominator. CONTRAST:  149m OMNIPAQUE IOHEXOL 350 MG/ML SOLN COMPARISON:  CT head 11/11/2019 FINDINGS: CTA NECK FINDINGS Aortic arch: Atherosclerotic aortic arch. Aorta mass and mildly dilated. Ascending aorta 38 mm in diameter. Atherosclerotic disease in the proximal great vessels without significant stenosis. Right carotid system: Right common carotid artery widely patent. Atherosclerotic calcification right carotid bifurcation. Less than 25% diameter stenosis proximal right internal carotid artery. Left carotid system: Left common carotid artery widely patent. Mild atherosclerotic calcification left carotid bifurcation without significant carotid stenosis. Vertebral arteries: Both vertebral arteries patent to the basilar. Mild-to-moderate stenosis distal right vertebral artery otherwise no vertebral stenosis. Skeleton: Marked kyphoscoliosis in the thoracic spine. No acute skeletal abnormality. Left occipital craniotomy and cranioplasty. Other neck: Negative for soft tissue mass. Upper chest: Apical scarring. Patchy airspace disease in the lung apices bilaterally which could be scarring or infection. 5 mm left upper lobe nodule Review of the MIP images confirms the above findings CTA HEAD FINDINGS Anterior circulation: Atherosclerotic calcification in the cavernous carotid bilaterally. Mild to moderate stenosis in the cavernous carotid bilaterally. Anterior and middle cerebral arteries patent bilaterally without significant stenosis. Posterior circulation: Mild to moderate stenosis distal right vertebral artery. Left vertebral patent. PICA patent  bilaterally. Basilar widely patent. Superior cerebellar and posterior cerebral arteries patent bilaterally. Venous sinuses: Patent Anatomic variants: None Review of the MIP images confirms the above findings IMPRESSION: 1. Atherosclerotic calcification carotid bifurcation without significant stenosis. Mild to moderate stenosis cavernous carotid bilaterally 2. Mild to moderate stenosis distal right vertebral artery. Left vertebral artery widely patent 3. No intracranial large vessel occlusion. Electronically Signed   By: CFranchot GalloM.D.   On: 11/11/2019 13:05   Ct Abdomen Pelvis W Contrast  Result Date: 11/08/2019 CLINICAL DATA:  Nausea/vomiting, scleroderma EXAM: CT ABDOMEN AND PELVIS WITH CONTRAST TECHNIQUE: Multidetector CT imaging of the abdomen and pelvis was performed using the standard protocol following bolus administration of intravenous contrast. CONTRAST:  828mOMNIPAQUE IOHEXOL 300 MG/ML  SOLN COMPARISON:  Partial comparison to CT chest dated 01/21/2019 FINDINGS: Lower chest: Faint ground-glass opacity with scattered small bilateral pulmonary nodules in the visualized lungs, measuring up to 7 mm in the right middle lobe, unchanged. Mildly dilated, fluid filled distal esophagus,  likely related to the patient's known scleroderma. Atherosclerotic calcifications of the aortic root. Three vessel coronary atherosclerosis. Hepatobiliary: Liver is within normal limits. Cholelithiasis, without associated inflammatory changes. No intrahepatic or extrahepatic ductal dilatation. Pancreas: Within normal limits. Spleen: Within normal limits. Adrenals/Urinary Tract: Adrenal glands are within normal limits. Kidneys are within normal limits.  No hydronephrosis. Bladder is within normal limits. Stomach/Bowel: Stomach is within normal limits. No evidence of bowel obstruction. Appendix is not discretely visualized. Left colon is decompressed. Vascular/Lymphatic: No evidence of abdominal aortic aneurysm.  Atherosclerotic calcifications of the abdominal aorta and branch vessels. No suspicious abdominopelvic lymphadenopathy. Reproductive: Uterus is within normal limits. Bilateral ovaries are unremarkable. Other: Trace pelvic ascites. Musculoskeletal: Visualized osseous structures are within normal limits. IMPRESSION: No evidence of bowel obstruction. Cholelithiasis, without associated inflammatory changes. Sequela of scleroderma and associated chronic lung disease involving the lower chest, unchanged. Electronically Signed   By: Julian Hy M.D.   On: 11/08/2019 15:20   Dg Chest Portable 1 View  Result Date: 11/11/2019 CLINICAL DATA:  Shortness of breath. EXAM: PORTABLE CHEST 1 VIEW COMPARISON:  Chest x-ray May 08, 2016. CT scan of the chest January 21, 2019 FINDINGS: Bilateral interstitial opacities are again identified and similar since the January 2020 CT scan. Prominence of the hila is likely due to pulmonary arterial hypertension given previous CT imaging. Stable cardiomegaly. No pneumothorax. No other acute abnormalities are identified. IMPRESSION: 1. Diffuse slightly nodular ground-glass opacities in the lungs are similar compared to January 2020 but increased compared to 2017. 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 No overt edema today. 2. Enlarged hila likely due to pulmonary arterial hypertension given previous CT imaging. Electronically Signed   By: Dorise Bullion III M.D   On: 11/11/2019 13:15   Dg Shoulder Left  Result Date: 11/23/2019 CLINICAL DATA:  Fall with shoulder pain EXAM: LEFT SHOULDER - 2+ VIEW COMPARISON:  None. FINDINGS: No fracture or malalignment.  AC joint appears intact. IMPRESSION: No acute osseous abnormality. Electronically Signed   By: Donavan Foil M.D.   On: 11/23/2019 15:40   Dg C-arm 1-60 Min  Result Date: 11/24/2019 CLINICAL DATA:  Open reduction, internal fixation of the left wrist.  EXAM: DG C-ARM 1-60 MIN FLUOROSCOPY TIME:  Fluoroscopy Time:  14 seconds Number of Acquired Spot Images: 3 COMPARISON:  11/23/2019 FINDINGS: The patient has undergone ORIF of the left distal radius. The alignment is improved. The hardware is intact. There are expected postsurgical changes including subcutaneous gas and overlying soft tissue edema. IMPRESSION: Status post ORIF of the left distal radius. Electronically Signed   By: Constance Holster M.D.   On: 11/24/2019 18:08   Dg Hip Unilat W Or Wo Pelvis 2-3 Views Left  Result Date: 11/23/2019 CLINICAL DATA:  Fall EXAM: DG HIP (WITH OR WITHOUT PELVIS) 2-3V LEFT COMPARISON:  CT 11/08/2019 FINDINGS: Acute displaced fractures involving the left superior and inferior pubic rami. The left femoral head is normally position. There are vascular calcifications. IMPRESSION: Acute displaced fractures involving the left superior and inferior pubic rami. Electronically Signed   By: Donavan Foil M.D.   On: 11/23/2019 15:39   Vas [place] Lower Extremity Venous (dvt)  Result Date: 11/05/2019  Lower Venous Study Indications: Edema, and Pain.  Risk Factors: Venous insufficiency. Limitations: Edema, skin texture. Comparison Study: No prior study on file Performing Technologist: Sharion Dove RVS  Examination Guidelines: A complete evaluation includes B-mode imaging, spectral Doppler, color Doppler, and power Doppler as needed  of all accessible portions of each vessel. Bilateral testing is considered an integral part of a complete examination. Limited examinations for reoccurring indications may be performed as noted.  +-----+---------------+---------+-----------+----------+--------------+  RIGHT Compressibility Phasicity Spontaneity Properties Thrombus Aging  +-----+---------------+---------+-----------+----------+--------------+  CFV   Full            Yes       Yes                                    +-----+---------------+---------+-----------+----------+--------------+    +---------+---------------+---------+-----------+----------+--------------+  LEFT      Compressibility Phasicity Spontaneity Properties Thrombus Aging  +---------+---------------+---------+-----------+----------+--------------+  CFV       Full            Yes       Yes                                    +---------+---------------+---------+-----------+----------+--------------+  SFJ       Full                                                             +---------+---------------+---------+-----------+----------+--------------+  FV Prox   Full                                                             +---------+---------------+---------+-----------+----------+--------------+  FV Mid    Full                                                             +---------+---------------+---------+-----------+----------+--------------+  FV Distal Full                                                             +---------+---------------+---------+-----------+----------+--------------+  PFV       Full                                                             +---------+---------------+---------+-----------+----------+--------------+  POP       Full            Yes       Yes                                    +---------+---------------+---------+-----------+----------+--------------+  PTV       Full                                                             +---------+---------------+---------+-----------+----------+--------------+  PERO      Full                                                             +---------+---------------+---------+-----------+----------+--------------+     Summary: Right: No evidence of common femoral vein obstruction. Left: There is no evidence of deep vein thrombosis in the lower extremity.  *See table(s) above for measurements and observations. Electronically signed by Monica Martinez MD on 11/05/2019 at 3:40:49 PM.    Final    US Abdomen Limited Ruq  Result Date: 11/13/2019 CLINICAL DATA:   Elevated LFTs. EXAM: ULTRASOUND ABDOMEN LIMITED RIGHT UPPER QUADRANT COMPARISON:  None. FINDINGS: Gallbladder: The gallbladder wall is upper limits of normal in thickness measuring 3 mm. Stone within the gallbladder is identified measuring 1.7 cm. No pericholecystic fluid. Negative sonographic Murphy's sign Common bile duct: Diameter: 2.3 mm Liver: No focal lesion identified. Within normal limits in parenchymal echogenicity. Portal vein is patent on color Doppler imaging with normal direction of blood flow towards the liver. Other: None. IMPRESSION: 1. Gallstone. Gallbladder wall thickness is upper limits of normal measuring 3 mm. If there is a high clinical concern for acute cholecystitis consider further evaluation with nuclear medicine hepatic biliary scan Electronically Signed   By: Kerby Moors M.D.   On: 11/13/2019 09:41       Discharge Exam: Vitals:   11/26/19 0422 11/26/19 0728  BP: (!) 142/49 (!) 154/51  Pulse: 63 65  Resp:  16  Temp: 97.7 F (36.5 C) 97.7 F (36.5 C)  SpO2: 100%     GENERAL: No acute distress.  Appears well.  HEENT: MMM.  Bruising over left frontal head.  NECK: Supple.  No apparent JVD.  RESP:  No IWOB. Good air movement bilaterally. CVS:  RRR. Heart sounds normal.  ABD/GI/GU: Bowel sounds present. Soft. Non tender.  MSK/EXT:  Moves extremities.  Left upper extremity in Ace wrap.  Right lower extremity in Ace wrap SKIN: Dark bruise over left frontal head. NEURO: Awake, alert and oriented appropriately.  No gross deficit.  PSYCH: Calm. Normal affect.   The results of significant diagnostics from this hospitalization (including imaging, microbiology, ancillary and laboratory) are listed below for reference.     Microbiology: Recent Results (from the past 240 hour(s))  SARS CORONAVIRUS 2 (TAT 6-24 HRS) Nasopharyngeal Nasopharyngeal Swab     Status: None   Collection Time: 11/23/19  7:14 PM   Specimen: Nasopharyngeal Swab  Result Value Ref Range Status     SARS Coronavirus 2 NEGATIVE NEGATIVE Final    Comment: (NOTE) SARS-CoV-2 target nucleic acids are NOT DETECTED. The SARS-CoV-2 RNA is generally detectable in upper and lower respiratory specimens during the acute phase of infection. Negative results do not preclude SARS-CoV-2 infection, do not rule out co-infections with other pathogens, and should not be used as the sole basis for treatment or other patient management decisions. Negative results must be combined with clinical observations, patient history, and epidemiological information. The expected result is Negative. Fact Sheet for Patients: SugarRoll.be Fact Sheet for Healthcare Providers: https://www.woods-mathews.com/ This test is not yet approved or cleared by the [place] FDA and  has been authorized for detection and/or diagnosis of SARS-CoV-2 by FDA under an Emergency Use Authorization (EUA). This EUA will remain  in effect (meaning this  test can be used) for the duration of the COVID-19 declaration under Section 56 4(b)(1) of the Act, 21 U.S.C. section 360bbb-3(b)(1), unless the authorization is terminated or revoked sooner. Performed at [facility], [place] [address], [place], [place] [address]   Surgical pcr screen     Status: Abnormal   Collection Time: 11/23/19  9:35 PM   Specimen: Nasal Mucosa; Nasal Swab  Result Value Ref Range Status   MRSA, PCR NEGATIVE NEGATIVE Final   Staphylococcus aureus POSITIVE (A) NEGATIVE Final    Comment: (NOTE) The Xpert SA Assay (FDA approved for NASAL specimens in patients 67 years of age and older), is one component of a comprehensive surveillance program. It is not intended to diagnose infection nor to guide or monitor treatment. Performed at [facility], [place] [address], [place], [place] [address]      Labs: BNP (last 3 results) No results for input(s): BNP in the last 8760 hours. Basic Metabolic Panel: Recent  Labs  Lab 11/23/19 1455 11/24/19 0251 11/25/19 0445 11/26/19 0822  NA 133* 135 135 139  K 3.2* 3.8 4.0 3.7  CL 92* 97* 99 103  CO2 _0 GLUCOSE 124* 94 138* 88  BUN _1 CREATININE 1.33* 1.24* 0.89 0.73  CALCIUM 8.0* 7.8* 7.1* 7.3*  MG 0.8* 1.6* 1.8 1.7  PHOS 4.5  --   --   --    Liver Function Tests: Recent Labs  Lab 11/23/19 1455  AST 45*  ALT 41  ALKPHOS 130*  BILITOT 0.6  PROT 6.9  ALBUMIN 3.3*   No results for input(s): LIPASE, AMYLASE in the last 168 hours. No results for input(s): AMMONIA in the last 168 hours. CBC: Recent Labs  Lab 11/23/19 1411 11/24/19 0251 11/25/19 0258 11/25/19 0445 11/25/19 1031 11/26/19 0416  WBC 7.6 7.7 7.7 12.6*  --  7.8  NEUTROABS 6.6  --   --   --   --   --   HGB 10.7* 9.3* 5.9* 6.8* 9.5* 9.6*  HCT 33.8* 29.4* 19.4* 21.9* 30.7* 30.1*  MCV 102.4* 103.5* 107.8* 104.8*  --  99.7  PLT 299 308 187 319  --  263   Cardiac Enzymes: No results for input(s): CKTOTAL, CKMB, CKMBINDEX, TROPONINI in the last 168 hours. BNP: Invalid input(s): POCBNP CBG: No results for input(s): GLUCAP in the last 168 hours. D-Dimer No results for input(s): DDIMER in the last 72 hours. Hgb A1c No results for input(s): HGBA1C in the last 72 hours. Lipid Profile No results for input(s): CHOL, HDL, LDLCALC, TRIG, CHOLHDL, LDLDIRECT in the last 72 hours. Thyroid function studies No results for input(s): TSH, T4TOTAL, T3FREE, THYROIDAB in the last 72 hours.  Invalid input(s): FREET3 Anemia work up No results for input(s): VITAMINB12, FOLATE, FERRITIN, TIBC, IRON, RETICCTPCT in the last 72 hours. Urinalysis    Component Value Date/Time   COLORURINE STRAW (A) 11/23/2019 0008   APPEARANCEUR CLEAR 11/23/2019 0008   LABSPEC 1.006 11/23/2019 0008   PHURINE 5.0 11/23/2019 0008   GLUCOSEU NEGATIVE 11/23/2019 0008   HGBUR NEGATIVE 11/23/2019 0008   BILIRUBINUR NEGATIVE 11/23/2019 0008   BILIRUBINUR NEG 12/10/2018 1211   KETONESUR  NEGATIVE 11/23/2019 0008   PROTEINUR NEGATIVE 11/23/2019 0008   UROBILINOGEN 0.2 12/10/2018 1211   NITRITE NEGATIVE 11/23/2019 0008   LEUKOCYTESUR NEGATIVE 11/23/2019 0008   Sepsis Labs Invalid input(s): PROCALCITONIN,  WBC,  LACTICIDVEN   Time coordinating discharge: 35 minutes  SIGNED:  Bretta Bang  Bettina Gavia, MD  [facility] 11/26/2019, 10:41 AM  If 7PM-7AM, please contact night-coverage [email or web address] Password [email or web address]

## 2019-11-30 ENCOUNTER — Other Ambulatory Visit: Payer: Medicare Other | Admitting: Internal Medicine

## 2019-11-30 ENCOUNTER — Inpatient Hospital Stay: Payer: Medicare Other | Admitting: Internal Medicine

## 2019-12-02 ENCOUNTER — Telehealth (HOSPITAL_COMMUNITY): Payer: Medicare Other | Admitting: Internal Medicine

## 2019-12-03 ENCOUNTER — Other Ambulatory Visit: Payer: Self-pay | Admitting: *Deleted

## 2019-12-03 ENCOUNTER — Encounter: Payer: Self-pay | Admitting: *Deleted

## 2019-12-03 ENCOUNTER — Telehealth: Payer: Self-pay

## 2019-12-03 DIAGNOSIS — R790 Abnormal level of blood mineral: Secondary | ICD-10-CM

## 2019-12-03 DIAGNOSIS — D649 Anemia, unspecified: Secondary | ICD-10-CM

## 2019-12-03 NOTE — Telephone Encounter (Signed)
Will write order for you to fax and can wait until next visit with nurse

## 2019-12-03 NOTE — Telephone Encounter (Signed)
Called and spoke with Inez Catalina the nurse for [facility] and she will be glad to draw labs and then she will take to [employer] for processing. I also see where we have Remo Lipps scheduled for CPE labs next week. Inez Catalina said he we would fax orders to her she will be glad to draw all of them. Fax number is [phone] and her cell number is [phone]. Do labs need to be drawn tomorrow or on Betty's next visit.

## 2019-12-03 NOTE — Telephone Encounter (Signed)
We can do virtual visit but we need to follow up on magnesium. Do they have a home health nurse coming that can draw those labs?

## 2019-12-03 NOTE — Telephone Encounter (Signed)
Will call nurse to see if they can do labs for magnesium, and we will do Hospital follow up as virtual visit with text.

## 2019-12-03 NOTE — Patient Outreach (Addendum)
[place] [facility]) Care Management  12/03/2019  [name] [name] Nov 03, 1951 [id number]    Telephone Assessment-Enrollment-Prevention Measures (Falls)  RN spoke with pt and permitted to speak with spouse [name] [name] who was also on the line today. RN introduced [facility] program and services and verified identifiers prior to explained the purpose for today's call. Pt receptive as Therapist, sports further engaged on possible needs. Pt states she currently has [facility]  involved with PT/OT/CNA and a visiting nurse. States [employer] will be providing DME for a lift chair through the provider's office due to her recent surgery. Pt using Home O2 via 3 liters with good sats (pulse-Ox available). Spouse explains the events that has lead up to pt's current condition. States pt underwent hand surgery and was placed on post op antibiotics that depleted pt's electrolytes. Pt developed GI issues with nausea and was hospitalized to repenlish her electrolytes. Pt discharged very weak and was readmitted for a falls injury that required a surgical intervention. Another area discussed with pt's was her HTN which is currently controlled with medication. Pt has a BP device to check if needed. States if there are issues pt is aware to contact Dr. [name] as this is the pt's cardiologist.  RN further discussed available services with [facility] that would not interfere with the current services such as [facility]. Discussed fall prevention measures and information along with an offer to sent printed material with enrollment into the [facility] program and services. Explained other services available such as pharmacy and social workers for additional resources if needed (pt remains receptive). Discuss the use of assisted devices and working with PT/OT to prevent risk of falls. Also encouraged pt to follow discharge plans as written and offer to review. Reviewed all medications and educated accordingly. Plan of care generated with all goals  and interventions discussed with pt understanding. Will update primary care provider on pt's disposition with [facility] services.  THN CM Care Plan Problem One     Most Recent Value  Care Plan Problem One  Recent hospitalization related to Impaired safety-falls  Role Documenting the Problem One  Care Management Telephonic Coordinator  Care Plan for Problem One  Active  THN Long Term Goal   Pt will not sustain fall within the next 90 days.  THN Long Term Goal Start Date  12/03/19  Interventions for Problem One Long Term Goal  Discuss prevention measures to utilize and prevent falls and/or injuries. Will encourage pt to use all DME and continue to work with PT/OT for strenghtening exercises and ROM to assist with pt's ongoing weakness since her recent surgery  THN CM Short Term Goal #1   Pt will work with the physical therapist on fall prevention methods over the next 30 days.  THN CM Short Term Goal #1 Start Date  12/03/19  Interventions for Short Term Goal #1  Will strongly encouraged adherence with pt participation in working with PT/OT in the home to prevent falls/injuries.  THN CM Short Term Goal #2   Pt/caregiver will implement strategies to increase safety and prevent falls in the home within the next 30 days.  THN CM Short Term Goal #2 Start Date  12/03/19  Interventions for Short Term Goal #2  Will offer to send fall prevention information and request caregiver and pt to review and read information to lower the risk of falls and injuries while being safe in the home.      Raina Mina, RN Care Management Coordinator [employer] [phone]

## 2019-12-03 NOTE — Telephone Encounter (Signed)
Patient called sates she can not walk and she wants to know if she can do a virtual visit tomorrow bc she won't be able to come in for a hospital follow up.

## 2019-12-03 NOTE — Telephone Encounter (Signed)
Faxed order to [facility] and spoke with Inez Catalina the nurse she will draw fasting labs on Tuesday 12/08/19 and take to [place]. Also spoke with patient to let her know fasting labs will be drawn on Tuesday by Inez Catalina.

## 2019-12-04 ENCOUNTER — Encounter: Payer: Self-pay | Admitting: Internal Medicine

## 2019-12-04 ENCOUNTER — Other Ambulatory Visit: Payer: Self-pay

## 2019-12-04 ENCOUNTER — Ambulatory Visit (INDEPENDENT_AMBULATORY_CARE_PROVIDER_SITE_OTHER): Payer: Medicare Other | Admitting: Internal Medicine

## 2019-12-04 ENCOUNTER — Telehealth (HOSPITAL_COMMUNITY): Payer: Medicare Other | Admitting: Internal Medicine

## 2019-12-04 VITALS — BP 124/54 | HR 70 | Temp 97.6°F | Ht 67.5 in | Wt 137.0 lb

## 2019-12-04 DIAGNOSIS — Z9981 Dependence on supplemental oxygen: Secondary | ICD-10-CM

## 2019-12-04 DIAGNOSIS — W19XXXD Unspecified fall, subsequent encounter: Secondary | ICD-10-CM

## 2019-12-04 DIAGNOSIS — Y92009 Unspecified place in unspecified non-institutional (private) residence as the place of occurrence of the external cause: Secondary | ICD-10-CM | POA: Diagnosis not present

## 2019-12-04 DIAGNOSIS — R790 Abnormal level of blood mineral: Secondary | ICD-10-CM

## 2019-12-04 DIAGNOSIS — M349 Systemic sclerosis, unspecified: Secondary | ICD-10-CM

## 2019-12-04 DIAGNOSIS — S52502D Unspecified fracture of the lower end of left radius, subsequent encounter for closed fracture with routine healing: Secondary | ICD-10-CM

## 2019-12-04 DIAGNOSIS — S3282XD Multiple fractures of pelvis without disruption of pelvic ring, subsequent encounter for fracture with routine healing: Secondary | ICD-10-CM

## 2019-12-04 DIAGNOSIS — D649 Anemia, unspecified: Secondary | ICD-10-CM | POA: Diagnosis not present

## 2019-12-04 MED ORDER — TRAMADOL HCL 50 MG PO TABS: 50.0000 mg | ORAL_TABLET | Freq: Two times a day (BID) | ORAL | 0 refills | Status: DC | PRN

## 2019-12-08 ENCOUNTER — Other Ambulatory Visit: Payer: Medicare Other | Admitting: Internal Medicine

## 2019-12-08 DIAGNOSIS — E039 Hypothyroidism, unspecified: Secondary | ICD-10-CM | POA: Diagnosis not present

## 2019-12-08 DIAGNOSIS — R55 Syncope and collapse: Secondary | ICD-10-CM | POA: Diagnosis not present

## 2019-12-08 DIAGNOSIS — M810 Age-related osteoporosis without current pathological fracture: Secondary | ICD-10-CM | POA: Diagnosis not present

## 2019-12-08 DIAGNOSIS — I739 Peripheral vascular disease, unspecified: Secondary | ICD-10-CM | POA: Diagnosis not present

## 2019-12-08 DIAGNOSIS — M349 Systemic sclerosis, unspecified: Secondary | ICD-10-CM | POA: Diagnosis not present

## 2019-12-08 DIAGNOSIS — I779 Disorder of arteries and arterioles, unspecified: Secondary | ICD-10-CM | POA: Diagnosis not present

## 2019-12-08 DIAGNOSIS — D649 Anemia, unspecified: Secondary | ICD-10-CM | POA: Diagnosis not present

## 2019-12-08 NOTE — Progress Notes (Signed)
[name], [name] ([id number]) Visit Report for 10/12/2019 Arrival Information Details Patient Name: Date of Service: [name] [name], [name] [name] 10/12/2019 8:30 AM Medical Record Number:[id number] Patient Account Number: [id number] Date of Birth/Sex: Treating RN: 10-20-1951 (68 y.o. Orvan Falconer Primary Care [place] Trawick: [name] Other Clinician: Referring Sady Monaco: Treating Lytle Malburg/Extender:Robson, Leotis Shames, MARY Weeks in Treatment: 394 Visit Information History Since Last Visit All ordered tests and consults were completed: No Patient Arrived: Ambulatory Added or deleted any medications: No Arrival Time: 09:00 Any new allergies or adverse reactions: No Accompanied By: self Had a fall or experienced change in No Transfer Assistance: None activities of daily living that may affect Patient Identification Verified: Yes risk of falls: Secondary Verification Process Completed: Yes Signs or symptoms of abuse/neglect since last No Patient Requires Transmission-Based No visito Precautions: Hospitalized since last visit: No Patient Has Alerts: No Implantable device outside of the clinic excluding No cellular tissue based products placed in the center since last visit: Has Dressing in Place as Prescribed: Yes Has Compression in Place as Prescribed: Yes Pain Present Now: No Electronic Signature(s) Signed: 12/08/2019 3:01:15 PM By: Carlene Coria RN Entered By: Carlene Coria on 10/12/2019 09:02:25 -------------------------------------------------------------------------------- Encounter Discharge Information Details Patient Name: Date of Service: [name] [name]. 10/12/2019 8:30 AM Medical Record Number:[id number] Patient Account Number: [id number] Date of Birth/Sex: Treating RN: 1951/08/31 (68 y.o. [name] Primary Care Byron Tipping: [name] Other Clinician: Referring Oluwatobiloba Martin: Treating Elexus Barman/Extender:Robson, Leotis Shames, MARY Weeks in Treatment: [phone] Encounter  Discharge Information Items Post Procedure Vitals Discharge Condition: Stable Temperature (F): 98.4 Ambulatory Status: Ambulatory Pulse (bpm): 75 Discharge Destination: Home Respiratory Rate (breaths/min): 18 Transportation: Private Auto Blood Pressure (mmHg): 124/65 Accompanied By: self Schedule Follow-up Appointment: Yes Clinical Summary of Care: Electronic Signature(s) Signed: 10/12/2019 6:01:15 PM By: Deon Pilling Entered By: Deon Pilling on 10/12/2019 09:57:23 -------------------------------------------------------------------------------- Lower Extremity Assessment Details Patient Name: Date of Service: [name], [name] 10/12/2019 8:30 AM Medical Record Number:[id number] Patient Account Number: [id number] Date of Birth/Sex: Treating RN: July 06, 1951 (68 y.o. Orvan Falconer Primary Care Dvaughn Fickle: [name] Other Clinician: Referring Alfredo Spong: Treating Jackalyn Haith/Extender:Robson, Leotis Shames, MARY Weeks in Treatment: 394 Edema Assessment Assessed: [Left: No] [Right: No] Edema: [Left: Ye] [Right: s] Calf Left: Right: Point of Measurement: 36 cm From Medial Instep cm 30 cm Ankle Left: Right: Point of Measurement: 10 cm From Medial Instep cm 21 cm Electronic Signature(s) Signed: 12/08/2019 3:01:15 PM By: Carlene Coria RN Entered By: Carlene Coria on 10/12/2019 09:03:34 -------------------------------------------------------------------------------- Multi Wound Chart Details Patient Name: Date of Service: [name] [name] 10/12/2019 8:30 AM Medical Record Number:[id number] Patient Account Number: [id number] Date of Birth/Sex: Treating RN: 21-Feb-1951 (68 y.o. [name] Primary Care Chontel Warning: [name] Other Clinician: Referring Ruqayya Ventress: Treating Shalea Tomczak/Extender:Robson, Leotis Shames, MARY Weeks in Treatment: 394 Vital Signs Height(in): 68 Pulse(bpm): 75 Weight(lbs): 132 Blood Pressure(mmHg): 124/65 Body Mass Index(BMI): 20 Temperature(F):  98.4 Respiratory 18 Rate(breaths/min): Photos: [14:No Photos] [5:No Photos] [7:No Photos] Wound Location: [14:Right Lower Leg - Medial, Right Lower Leg - Medial Right Malleolus - Lateral Anterior] Wounding Event: [14:Gradually Appeared] [5:Gradually Appeared] [7:Gradually Appeared] 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] [5:Venous Leg Ulcer] [7:Venous Leg Ulcer] Comorbid History: [14:Anemia, Hypertension, Peripheral Arterial Disease, Peripheral Arterial Disease, Peripheral Arterial Disease, Peripheral Venous Disease, Peripheral Venous Disease, Peripheral Venous Disease, Raynauds, Scleroderma, Raynauds,  Scleroderma, Raynauds, Scleroderma, Rheumatoid Arthritis, Osteoarthritis] [5:Anemia, Hypertension, Rheumatoid Arthritis, Osteoarthritis] [7:Anemia, Hypertension, Rheumatoid Arthritis, Osteoarthritis] Date Acquired: [14:11/03/2018] [5:01/13/2013] [7:06/25/2016] Weeks of Treatment: [14:49] [5:350] [7:170] Wound Status: [14:Open] [5:Open] [7:Open] Clustered Wound: [14:No] [  5:Yes] [7:No] Measurements L x W x D 0.3x0.3x0.3 [5:1.3x1.2x0.6] [7:0.7x0.5x0.3] (cm) Area (cm) : [14:0.071] [5:1.225] [7:0.275] Volume (cm) : [14:0.021] [5:0.735] [7:0.082] % Reduction in Area: [14:54.80%] [5:13.40%] [7:89.20%] % Reduction in Volume: 32.30% [5:-421.30%] [7:67.80%] Classification: [14:Full Thickness Without Exposed Support Structures Exposed Support Structures Exposed Support Structures] [5:Full Thickness With] [7:Full Thickness Without] Exudate Amount: [14:Medium] [5:Medium] [7:Medium] Exudate Type: [14:Serous] [5:Serous] [7:Serosanguineous] Exudate Color: [14:amber] [5:amber] [7:red, brown] Wound Margin: [14:Distinct, outline attached Distinct, outline attached Distinct, outline attached] Granulation Amount: [14:Small (1-33%)] [5:Small (1-33%)] [7:None Present (0%)] Granulation Quality: [14:Pink, Pale] [5:Red, Pink] [7:N/A] Necrotic Amount: [14:Large (67-100%)] [5:Large (67-100%)] [7:Large  (67-100%)] Exposed Structures: [14:Fat Layer (Subcutaneous Fat Layer (Subcutaneous Fat Layer (Subcutaneous Tissue) Exposed: Yes Fascia: No Tendon: No Muscle: No Joint: No Bone: No] [5:Tissue) Exposed: Yes Muscle: Yes Fascia: No Tendon: No Joint: No Bone: No] [7:Tissue) Exposed: Yes  Fascia: No Tendon: No Muscle: No Joint: No Bone: No] Epithelialization: [14:Small (1-33%)] [5:Small (1-33%)] [7:None] Debridement: [14:N/A] [5:Debridement - Excisional Debridement - Excisional] Pre-procedure [14:N/A] [5:09:22] [7:09:21] Verification/Time Out Taken: Pain Control: [14:N/A] [5:Lidocaine 5% topical ointment] [7:Lidocaine 5% topical ointment] Tissue Debrided: [14:N/A] [5:Subcutaneous, Slough] [7:Subcutaneous, Slough] Level: [14:N/A] [5:Skin/Subcutaneous Tissue Skin/Subcutaneous Tissue] Debridement Area (sq cm):N/A [5:1.56] [7:0.35] Instrument: [14:N/A] [5:Curette] [7:Curette] Bleeding: [14:N/A] [5:Minimum] [7:Minimum] Procedural Pain: [14:N/A] [5:0] [7:0] Post Procedural Pain: [14:N/A] [5:0] [7:0] Debridement Treatment [14:N/A] [5:Procedure was tolerated] [7:Procedure was tolerated] Response: [5:well] [7:well] Post Debridement [14:N/A] [5:1.3x1.2x0.1] [7:0.7x0.5x0.1] Measurements L x W x D (cm) Post Debridement [14:N/A] [1:9.417] [7:0.027] Volume: (cm) Procedures Performed: [14:N/A] [5:Debridement] [7:Debridement] Treatment Notes Electronic Signature(s) Signed: 10/12/2019 5:50:55 PM By: Linton Ham MD Signed: 10/13/2019 5:52:09 PM By: Levan Hurst RN, BSN Entered By: Linton Ham on 10/12/2019 09:41:02 -------------------------------------------------------------------------------- Multi-Disciplinary Care Plan Details Patient Name: Date of Service: [name] [name]. 10/12/2019 8:30 AM Medical Record Number:[id number] Patient Account Number: [id number] Date of Birth/Sex: Treating RN: [birth year] (68 y.o. [name] Primary Care Zowie Lundahl: [name] Other  Clinician: Referring Nolberto Cheuvront: Treating Mikalia Fessel/Extender:Robson, Leotis Shames, MARY Weeks in Treatment: 394 Active Inactive Wound/Skin Impairment Nursing Diagnoses: Impaired tissue integrity Knowledge deficit related to ulceration/compromised skin integrity Goals: Patient/caregiver will verbalize understanding of skin care regimen Date Initiated: 10/11/2016 Target Resolution Date: 11/13/2019 Goal Status: Active Interventions: Assess patient/caregiver ability to obtain necessary supplies Assess patient/caregiver ability to perform ulcer/skin care regimen upon admission and as needed Assess ulceration(s) every visit Provide education on ulcer and skin care Treatment Activities: Skin care regimen initiated : 10/11/2016 Topical wound management initiated : 10/11/2016 Notes: Electronic Signature(s) Signed: 10/12/2019 5:18:03 PM By: Kela Millin Entered By: Kela Millin on 10/12/2019 08:51:38 -------------------------------------------------------------------------------- Pain Assessment Details Patient Name: Date of Service: [name] [name] 10/12/2019 8:30 AM Medical Record Number:[id number] Patient Account Number: [id number] Date of Birth/Sex: Treating RN: [birth year] (68 y.o. Orvan Falconer Primary Care Damaris Geers: [name] Other Clinician: Referring Kepler Mccabe: Treating Braylyn Eye/Extender:Robson, Leotis Shames, MARY Weeks in Treatment: 394 Active Problems Location of Pain Severity and Description of Pain Patient Has Paino No Site Locations Pain Management and Medication Current Pain Management: Electronic Signature(s) Signed: 12/08/2019 3:01:15 PM By: Carlene Coria RN Entered By: Carlene Coria on 10/12/2019 09:02:56 -------------------------------------------------------------------------------- Patient/Caregiver Education Details Patient Name: Date of Service: [name] [name] 10/12/2020andnbsp8:30 AM Medical Record Number:[id number] Patient Account Number:  [id number] Date of Birth/Gender: 07-04-1951 (68 y.o. F) Treating RN: Kela Millin Primary Care Physician: [name] Other Clinician: Referring Physician: Treating Physician/Extender:Robson, Leotis Shames, Timoteo Expose in Treatment: 408 Education Assessment Education Provided To: Patient Education Topics Provided Wound/Skin Impairment: Methods: Explain/Verbal  Responses: State content correctly Electronic Signature(s) Signed: 10/12/2019 5:18:03 PM By: Kela Millin Entered By: Kela Millin on 10/12/2019 08:52:07 -------------------------------------------------------------------------------- Wound Assessment Details Patient Name: Date of Service: [name] [name] 10/12/2019 8:30 AM Medical Record Number:[id number] Patient Account Number: [id number] Date of Birth/Sex: Treating RN: 09/27/1951 (68 y.o. Orvan Falconer Primary Care Akaylah Lalley: [name] Other Clinician: Referring Shaquala Broeker: Treating Harless Molinari/Extender:Robson, Leotis Shames, MARY Weeks in Treatment: 394 Wound Status Wound Number: 14 Primary Venous Leg Ulcer Etiology: Wound Location: Right Lower Leg - Medial, Anterior Wound Open Wounding Event: Gradually Appeared Status: Date Acquired: 11/03/2018 Comorbid Anemia, Hypertension, Peripheral Arterial Weeks Of Treatment: 49 History: Disease, Peripheral Venous Disease, Clustered Wound: No Raynauds, Scleroderma, Rheumatoid Arthritis, Osteoarthritis Photos Wound Measurements Length: (cm) 0.3 % Reduct Width: (cm) 0.3 % Reduct Depth: (cm) 0.3 Epithel Area: (cm) 0.071 Tunnel Volume: (cm) 0.021 Underm Wound Description Full Thickness Without Exposed Support Foul Od Classification: Structures Slough/ Wound Distinct, outline attached Margin: Exudate Medium Amount: Exudate Serous Type: Exudate amber Color: Wound Bed Granulation Amount: Small (1-33%) Granulation Quality: Pink, Pale Fascia Necrotic Amount: Large (67-100%) Fat Lay Necrotic  Quality: Adherent Slough Tendon Muscle Joint E Bone Ex or After Cleansing: No Fibrino Yes Exposed Structure Exposed: No er (Subcutaneous Tissue) Exposed: Yes Exposed: No Exposed: No xposed: No posed: No ion in Area: 54.8% ion in Volume: 32.3% ialization: Small (1-33%) ing: No ining: No Electronic Signature(s) Signed: 11/04/2019 4:01:42 PM By: Mikeal Hawthorne EMT/HBOT Signed: 12/08/2019 3:01:15 PM By: Carlene Coria RN Entered By: Mikeal Hawthorne on 10/13/2019 09:30:28 -------------------------------------------------------------------------------- Wound Assessment Details Patient Name: Date of Service: [name] [name] 10/12/2019 8:30 AM Medical Record Number:[id number] Patient Account Number: [id number] Date of Birth/Sex: Treating RN: 1951-05-02 (68 y.o. Orvan Falconer Primary Care Lile Mccurley: [name] Other Clinician: Referring Naim Murtha: Treating Dhruvi Crenshaw/Extender:Robson, Leotis Shames, MARY Weeks in Treatment: 394 Wound Status Wound Number: 5 Primary Venous Leg Ulcer Etiology: Wound Location: Right Lower Leg - Medial Wound Open Wounding Event: Gradually Appeared Status: Date Acquired: 01/13/2013 Comorbid Anemia, Hypertension, Peripheral Arterial Weeks Of Treatment: 350 History: Disease, Peripheral Venous Disease, Clustered Wound: Yes Raynauds, Scleroderma, Rheumatoid Arthritis, Osteoarthritis Photos Wound Measurements Length: (cm) 1.3 % Reductio Width: (cm) 1.2 % Reductio Depth: (cm) 0.6 Epithelial Area: (cm) 1.225 Tunneling Volume: (cm) 0.735 Undermini Wound Description Full Thickness With Exposed Support Classification: Structures Wound Distinct, outline attached Margin: Exudate Medium Amount: Exudate Type: Serous Exudate Color: amber Wound Bed Granulation Amount: Small (1-33%) Granulation Quality: Red, Pink Necrotic Amount: Large (67-100%) Necrotic Quality: Adherent Slough Foul Odor After Cleansing: No Slough/Fibrino Yes Exposed  Structure Fascia Exposed: No Fat Layer (Subcutaneous Tissue) Exposed: Yes Tendon Exposed: No Muscle Exposed: Yes Necrosis of Muscle: Yes Joint Exposed: No Bone Exposed: No n in Area: 13.4% n in Volume: -421.3% ization: Small (1-33%) : No ng: No Electronic Signature(s) Signed: 11/04/2019 4:01:42 PM By: Mikeal Hawthorne EMT/HBOT Signed: 12/08/2019 3:01:15 PM By: Carlene Coria RN Entered By: Mikeal Hawthorne on 10/13/2019 09:29:39 -------------------------------------------------------------------------------- Wound Assessment Details Patient Name: Date of Service: [name] [name] 10/12/2019 8:30 AM Medical Record Number:[id number] Patient Account Number: [id number] Date of Birth/Sex: Treating RN: February 02, 1951 (68 y.o. Orvan Falconer Primary Care Nashiya Disbrow: [name] Other Clinician: Referring Lekeya Rollings: Treating Woodson Macha/Extender:Robson, Leotis Shames, MARY Weeks in Treatment: 394 Wound Status Wound Number: 7 Primary Venous Leg Ulcer Etiology: Wound Location: Right Malleolus - Lateral Wound Open Wounding Event: Gradually Appeared Status: Date Acquired: 06/25/2016 Comorbid Anemia, Hypertension, Peripheral Arterial Weeks Of Treatment: 170 History: Disease, Peripheral Venous Disease, Clustered Wound: No Raynauds, Scleroderma,  Rheumatoid Arthritis, Osteoarthritis Photos Wound Measurements Length: (cm) 0.7 % Reduct Width: (cm) 0.5 % Reduct Depth: (cm) 0.3 Epitheli Area: (cm) 0.275 Tunneli Volume: (cm) 0.082 Undermi Wound Description Classification: Full Thickness Without Exposed Support Foul Odo Structures Slough/F Wound Distinct, outline attached Margin: Exudate Medium Amount: Exudate Serosanguineous Type: Exudate red, brown Color: Wound Bed Granulation Amount: None Present (0%) Necrotic Amount: Large (67-100%) Fascia E Necrotic Quality: Adherent Slough Fat Laye Tendon E Muscle E Joint Ex Bone Exp r After Cleansing: No ibrino Yes Exposed Structure xposed:  No r (Subcutaneous Tissue) Exposed: Yes xposed: No xposed: No posed: No osed: No ion in Area: 89.2% ion in Volume: 67.8% alization: None ng: No ning: No Electronic Signature(s) Signed: 11/04/2019 4:01:42 PM By: Mikeal Hawthorne EMT/HBOT Signed: 12/08/2019 3:01:15 PM By: Carlene Coria RN Entered By: Mikeal Hawthorne on 10/13/2019 09:28:06 -------------------------------------------------------------------------------- Vitals Details Patient Name: Date of Service: [name] [name]. 10/12/2019 8:30 AM Medical Record Number:[id number] Patient Account Number: [id number] Date of Birth/Sex: Treating RN: 02-Dec-1951 (68 y.o. Orvan Falconer Primary Care Lee-Anne Flicker: [name] Other Clinician: Referring Robby Bulkley: Treating Kasson Lamere/Extender:Robson, Leotis Shames, MARY Weeks in Treatment: 394 Vital Signs Time Taken: 09:02 Temperature (F): 98.4 Height (in): 68 Pulse (bpm): 75 Weight (lbs): 132 Respiratory Rate (breaths/min): 18 Body Mass Index (BMI): 20.1 Blood Pressure (mmHg): 124/65 Reference Range: 80 - 120 mg / dl Electronic Signature(s) Signed: 12/08/2019 3:01:15 PM By: Carlene Coria RN Entered By: Carlene Coria on 10/12/2019 09:02:46

## 2019-12-08 NOTE — Progress Notes (Signed)
[name] [name], [name] [name] ([id number]) Visit Report for 08/31/2019 Arrival Information Details Patient Name: Date of Service: [name] [name], [name] [name] 08/31/2019 7:30 AM Medical Record Number:[id number] Patient Account Number: [id number] Date of Birth/Sex: Treating RN: Apr 17, 1951 (68 y.o. Orvan Falconer Primary Care Carey Lafon: [name] Other Clinician: Referring Abou Sterkel: Treating Marene Gilliam/Extender:Robson, Leotis Shames, MARY Weeks in Treatment: 388 Visit Information History Since Last Visit All ordered tests and consults were completed: No Patient Arrived: Ambulatory Added or deleted any medications: No Arrival Time: 07:51 Any new allergies or adverse reactions: No Accompanied By: self Had a fall or experienced change in No Transfer Assistance: None activities of daily living that may affect Patient Identification Verified: Yes risk of falls: Secondary Verification Process Completed: Yes Signs or symptoms of abuse/neglect since last No Patient Requires Transmission-Based No visito Precautions: Hospitalized since last visit: No Patient Has Alerts: No Implantable device outside of the clinic excluding No cellular tissue based products placed in the center since last visit: Has Dressing in Place as Prescribed: Yes Has Compression in Place as Prescribed: Yes Pain Present Now: No Electronic Signature(s) Signed: 12/08/2019 3:07:45 PM By: Carlene Coria RN Entered By: Carlene Coria on 08/31/2019 07:52:11 -------------------------------------------------------------------------------- Encounter Discharge Information Details Patient Name: Date of Service: [name] [name]. 08/31/2019 7:30 AM Medical Record Number:[id number] Patient Account Number: [id number] Date of Birth/Sex: Treating RN: [birth year] (67 y.o. [name] Primary Care Mikia Delaluz: [name] Other Clinician: Referring Rielynn Trulson: Treating Huntington Leverich/Extender:Robson, Leotis Shames, MARY Weeks in Treatment: [phone] Encounter  Discharge Information Items Post Procedure Vitals Discharge Condition: Stable Temperature (F): 97.9 Ambulatory Status: Ambulatory Pulse (bpm): 76 Discharge Destination: Home Respiratory Rate (breaths/min): 18 Transportation: Private Auto Blood Pressure (mmHg): 133/68 Accompanied By: self Schedule Follow-up Appointment: Yes Clinical Summary of Care: Electronic Signature(s) Signed: 08/31/2019 5:34:15 PM By: Deon Pilling Entered By: Deon Pilling on 08/31/2019 08:35:13 -------------------------------------------------------------------------------- Lower Extremity Assessment Details Patient Name: Date of Service: [name] [name], [name] [name] 08/31/2019 7:30 AM Medical Record Number:[id number] Patient Account Number: [id number] Date of Birth/Sex: Treating RN: 11-11-1951 (67 y.o. Orvan Falconer Primary Care Nishtha Raider: [name] Other Clinician: Referring Teona Vargus: Treating Alexsia Klindt/Extender:Robson, Leotis Shames, MARY Weeks in Treatment: 388 Edema Assessment Assessed: [Left: No] [Right: No] Edema: [Left: Ye] [Right: s] Calf Left: Right: Point of Measurement: 36 cm From Medial Instep cm 31 cm Ankle Left: Right: Point of Measurement: 10 cm From Medial Instep cm 20 cm Electronic Signature(s) Signed: 12/08/2019 3:07:45 PM By: Carlene Coria RN Entered By: Carlene Coria on 08/31/2019 07:56:21 -------------------------------------------------------------------------------- Multi Wound Chart Details Patient Name: Date of Service: [name] [name]. 08/31/2019 7:30 AM Medical Record Number:[id number] Patient Account Number: [id number] Date of Birth/Sex: Treating RN: November 24, 1951 (68 y.o. 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] [5:Venous Leg Ulcer] [7:Venous Leg Ulcer] Comorbid History: [14:Anemia, Hypertension, Peripheral Arterial Disease, Peripheral Arterial Disease, Peripheral Arterial Disease, Peripheral Venous Disease, Peripheral Venous Disease, Peripheral Venous Disease, Raynauds, Scleroderma, Raynauds,  Scleroderma, Raynauds, Scleroderma, Rheumatoid Arthritis, Osteoarthritis] [5:Anemia, Hypertension, Rheumatoid Arthritis, Osteoarthritis] [7:Anemia, Hypertension, Rheumatoid Arthritis, Osteoarthritis] Date Acquired: [14:11/03/2018] [5:01/13/2013] [7:06/25/2016] Weeks of Treatment: [14:43] [0:300] [7:164] Wound Status: [14:Open] [5:Open] [7:Open] Clustered Wound: [14:No] [  5:Yes] [7:No] Measurements L x W x D 0.3x0.4x0.1 [5:1.1x1x0.2] [7:1x0.4x0.1] (cm) Area (cm) : [14:0.094] [5:0.864] [7:0.314] Volume (cm) : [14:0.009] [5:0.173] [7:0.031] % Reduction in Area: [14:40.10%] [5:38.90%] [7:87.70%] % Reduction in Volume: 71.00% [5:-22.70%] [7:87.80%] Classification: [14:Full Thickness Without Exposed Support Structures Exposed Support Structures Exposed Support Structures] [5:Full Thickness With] [7:Full Thickness Without] Exudate Amount: [14:Medium] [5:Medium] [7:Medium] Exudate Type: [14:Serous] [5:Serous] [7:Serosanguineous] Exudate Color: [14:amber] [5:amber] [7:red, brown] Wound Margin: [14:Distinct, outline attached Distinct, outline attached Distinct, outline attached] Granulation Amount: [14:Small (1-33%)] [5:Medium (34-66%)] [7:None Present (0%)] Granulation Quality: [14:Pink, Pale] [5:Red, Pink] [7:N/A] Necrotic Amount: [14:Large (67-100%)] [5:Medium (34-66%)] [7:Large (67-100%)] Exposed  Structures: [14:Fat Layer (Subcutaneous Fat Layer (Subcutaneous Fat Layer (Subcutaneous Tissue) Exposed: Yes Fascia: No Tendon: No Muscle: No Joint: No Bone: No] [5:Tissue) Exposed: Yes Muscle: Yes Fascia: No Tendon: No Joint: No Bone: No] [7:Tissue) Exposed: Yes  Fascia: No Tendon: No Muscle: No Joint: No Bone: No] Epithelialization: [14:Small (1-33%)] [5:Small (1-33%)] [7:None] Debridement: [14:N/A] [5:Debridement - Excisional Debridement - Excisional] Pre-procedure [14:N/A] [5:08:18] [7:08:18] Verification/Time Out Taken: Tissue Debrided: [14:N/A] [5:Subcutaneous, Slough] [7:Subcutaneous, Slough] Level: [14:N/A] [5:Skin/Subcutaneous Tissue Skin/Subcutaneous Tissue] Debridement Area (sq cm):N/A [5:1.1] [7:0.4] Instrument: [14:N/A] [5:Curette] [7:Curette] Bleeding: [14:N/A] [5:Minimum] [7:Minimum] Hemostasis Achieved: [14:N/A] [5:Pressure] [7:Pressure] Procedural Pain: [14:N/A] [5:0] [7:0] Post Procedural Pain: [14:N/A] [5:0] [7:0] Debridement Treatment [14:N/A] [5:Procedure was tolerated] [7:Procedure was tolerated] Response: [5:well] [7:well] Post Debridement [14:N/A] [5:1.1x1x0.2] [7:1x0.4x0.1] Measurements L x W x D (cm) Post Debridement [14:N/A] [5:0.173] [7:0.031] Volume: (cm) Procedures Performed: [14:N/A] [5:Debridement] [7:Debridement] Treatment Notes Wound #14 (Right, Medial, Anterior Lower Leg) 1. Cleanse With Wound Cleanser 2. Periwound Care Moisturizing lotion 3. Primary Dressing Applied Santyl 4. Secondary Dressing Dry Gauze 6. Support Layer Applied Kerlix/Coban Wound #5 (Right, Medial Lower Leg) 1. Cleanse With Wound Cleanser 2. Periwound Care Moisturizing lotion 3. Primary Dressing Applied Santyl 4. Secondary Dressing Dry Gauze 6. Support Layer Applied Kerlix/Coban Wound #7 (Right, Lateral Malleolus) 1. Cleanse With Wound Cleanser 2. Periwound Care Moisturizing lotion 3. Primary Dressing Applied Santyl 4. Secondary Dressing Dry Gauze 6.  Support Layer Holiday representative) Signed: 08/31/2019 5:58:27 PM By: Linton Ham MD Signed: 08/31/2019 6:17:49 PM By: Levan Hurst RN, BSN Entered By: Linton Ham on 08/31/2019 08:35:22 -------------------------------------------------------------------------------- Multi-Disciplinary Care Plan Details Patient Name: Date of Service: Falyn, Rubel 08/31/2019 7:30 AM Medical Record Number:[id number] Patient Account Number: [id number] Date of Birth/Sex: Treating RN: [birth year] (68 y.o. [name] Primary Care Carlo Lorson: [name] Other Clinician: Referring Raheen Capili: Treating Elianys Conry/Extender:Robson, Leotis Shames, MARY Weeks in Treatment: [phone] Active Inactive Wound/Skin Impairment Nursing Diagnoses: Impaired tissue integrity Knowledge deficit related to ulceration/compromised skin integrity Goals: Patient/caregiver will verbalize understanding of skin care regimen Date Initiated: 10/11/2016 Target Resolution Date: 09/18/2019 Goal Status: Active Interventions: Assess patient/caregiver ability to obtain necessary supplies Assess patient/caregiver ability to perform ulcer/skin care regimen upon admission and as needed Assess ulceration(s) every visit Provide education on ulcer and skin care Treatment Activities: Skin care regimen initiated : 10/11/2016 Topical wound management initiated : 10/11/2016 Notes: Electronic Signature(s) Signed: 08/31/2019 6:17:49 PM By: Levan Hurst RN, BSN Entered By: Levan Hurst on 08/31/2019 07:49:44 -------------------------------------------------------------------------------- Pain Assessment Details Patient Name: Date of Service: [name] [name]. 08/31/2019 7:30 AM Medical Record Number:[id number] Patient Account Number: [id number] Date of Birth/Sex: Treating RN: 1951/06/26 (67 y.o. Orvan Falconer Primary Care Savannha Welle: [name] Other Clinician: Referring Deronte Solis: Treating  Pollyanna Levay/Extender:Robson, Leotis Shames, MARY Weeks in Treatment: [phone] Active Problems Location of Pain Severity and Description of Pain Patient Has Paino No  Site Locations Pain Management and Medication Current Pain Management: Electronic Signature(s) Signed: 12/08/2019 3:07:45 PM By: Carlene Coria RN Entered By: Carlene Coria on 08/31/2019 07:52:55 -------------------------------------------------------------------------------- Patient/Caregiver Education Details Patient Name: Date of Service: [name] [name] 8/31/2020andnbsp7:30 AM Medical Record Number:[id number] Patient Account Number: [id number] Date of Birth/Gender: 1951/02/01 (67 y.o. F) Treating RN: Levan Hurst Primary Care Physician: [name] Other Clinician: Referring Physician: Treating Physician/Extender:Robson, Leotis Shames, Timoteo Expose in Treatment: [phone] Education Assessment Education Provided To: Patient Education Topics Provided Wound/Skin Impairment: Methods: Explain/Verbal Responses: State content correctly Electronic Signature(s) Signed: 08/31/2019 6:17:49 PM By: Levan Hurst RN, BSN Entered By: Levan Hurst on 08/31/2019 07:50:03 -------------------------------------------------------------------------------- Wound Assessment Details Patient Name: Date of Service: [name] [name]. 08/31/2019 7:30 AM Medical Record Number:[id number] Patient Account Number: [id number] Date of Birth/Sex: Treating RN: 04/18/1951 (67 y.o. Orvan Falconer Primary Care Celedonio Sortino: [name] Other Clinician: Referring Eissa Buchberger: Treating Caylei Sperry/Extender:Robson, Leotis Shames, MARY Weeks in Treatment: 388 Wound Status Wound Number: 14 Primary Venous Leg Ulcer Etiology: Wound Location: Right Lower Leg - Medial, Anterior Wound Open Wounding Event: Gradually Appeared Status: Date Acquired: 11/03/2018 Comorbid Anemia, Hypertension, Peripheral Arterial Weeks Of Treatment: 43 History: Disease, Peripheral Venous  Disease, Clustered Wound: No Raynauds, Scleroderma, Rheumatoid Arthritis, Osteoarthritis Photos Photo Uploaded By: Marlou Sa on 09/01/2019 08:38:14 Wound Measurements Length: (cm) 0.3 % Reduct Width: (cm) 0.4 % Reduct Depth: (cm) 0.1 Epitheli Area: (cm) 0.094 Tunneli Volume: (cm) 0.009 Undermi Wound Description Classification: Full Thickness Without Exposed Support Foul Odo Structures Slough/F Wound Distinct, outline attached Margin: Exudate Medium Amount: Exudate Serous Type: Exudate amber Color: Wound Bed Granulation Amount: Small (1-33%) Granulation Quality: Pink, Pale Fascia E Necrotic Amount: Large (67-100%) Fat Layer Necrotic Quality: Adherent Slough Tendon Exp Muscle Exp Joint Expo Bone Expos r After Cleansing: No ibrino Yes Exposed Structure xposed: No (Subcutaneous Tissue) Exposed: Yes osed: No osed: No sed: No ed: No ion in Area: 40.1% ion in Volume: 71% alization: Small (1-33%) ng: No ning: No Electronic Signature(s) Signed: 12/08/2019 3:07:45 PM By: Carlene Coria RN Entered By: Carlene Coria on 08/31/2019 08:04:42 -------------------------------------------------------------------------------- Wound Assessment Details Patient Name: Date of Service: [name] [name], [name] [name]. 08/31/2019 7:30 AM Medical Record Number:[id number] Patient Account Number: [id number] Date of Birth/Sex: Treating RN: 03-05-1951 (68 y.o. Orvan Falconer Primary Care Brahm Barbeau: [name] Other Clinician: Referring Lindbergh Winkles: Treating Tamyrah Burbage/Extender:Robson, Leotis Shames, MARY Weeks in Treatment: 388 Wound Status Wound Number: 5 Primary Venous Leg Ulcer Etiology: Wound Location: Right Lower Leg - Medial Wound Open Wounding Event: Gradually Appeared Status: Date Acquired: 01/13/2013 Comorbid Anemia, Hypertension, Peripheral Arterial Weeks Of Treatment: 344 History: Disease, Peripheral Venous Disease, Clustered Wound: Yes Raynauds, Scleroderma,  Rheumatoid Arthritis, Osteoarthritis Photos Photo Uploaded By: Marlou Sa on 09/01/2019 08:38:30 Wound Measurements Length: (cm) 1.1 % Reductio Width: (cm) 1 % Reductio Depth: (cm) 0.2 Epithelial Area: (cm) 0.864 Tunneling Volume: (cm) 0.173 Undermini Wound Description Full Thickness With Exposed Support Foul Odor Classification: Structures Slough/Fib Wound Distinct, outline attached Distinct, outline attached Margin: Exudate Medium Amount: Exudate Type: Serous Exudate Color: amber Wound Bed Granulation Amount: Medium (34-66%) Granulation Quality: Red, Pink Fascia E Necrotic Amount: Medium (34-66%) Fat Laye Necrotic Quality: Adherent Slough Tendon E Muscle E Nec Joint Ex Bone Exp After Cleansing: No rino Yes Exposed Structure xposed: No r (Subcutaneous Tissue) Exposed: Yes xposed: No xposed: Yes rosis of Muscle: Yes posed: No osed: No n in Area: 38.9% n in Volume: -22.7% ization: Small (1-33%) : No ng: No Electronic Signature(s) Signed: 12/08/2019 3:07:45 PM By: Carlene Coria  RN Entered By: Carlene Coria on 08/31/2019 08:05:03 -------------------------------------------------------------------------------- Wound Assessment Details Patient Name: Date of Service: [name] [name], [name] [name] 08/31/2019 7:30 AM Medical Record Number:[id number] Patient Account Number: [id number] Date of Birth/Sex: Treating RN: Oct 12, 1951 (68 y.o. Orvan Falconer Primary Care Helyne Genther: [name] Other Clinician: Referring Braelyn Bordonaro: Treating Billye Nydam/Extender:Robson, Leotis Shames, MARY Weeks in Treatment: 388 Wound Status Wound Number: 7 Primary Venous Leg Ulcer Etiology: Wound Location: Right Malleolus - Lateral Wound Open Wounding Event: Gradually Appeared Status: Date Acquired: 06/25/2016 Comorbid Anemia, Hypertension, Peripheral Arterial Weeks Of Treatment: 164 History: Disease, Peripheral Venous Disease, Clustered Wound: No Raynauds, Scleroderma,  Rheumatoid Arthritis, Osteoarthritis Photos Photo Uploaded By: Marlou Sa on 09/01/2019 08:38:45 Wound Measurements Length: (cm) 1 % Reduct Width: (cm) 0.4 % Reduct Depth: (cm) 0.1 Epitheli Area: (cm) 0.314 Tunneli Volume: (cm) 0.031 Undermi Wound Description Classification: Full Thickness Without Exposed Support Foul Od Structures Slough/ Wound Distinct, outline attached Margin: Exudate Medium Amount: Exudate Serosanguineous Type: Exudate red, brown Color: Wound Bed Granulation Amount: None Present (0%) Necrotic Amount: Large (67-100%) Fascia E Necrotic Quality: Adherent Slough Fat Laye Tendon E Muscle E Joint Ex Bone Exp or After Cleansing: No Fibrino Yes Exposed Structure xposed: No r (Subcutaneous Tissue) Exposed: Yes xposed: No xposed: No posed: No osed: No ion in Area: 87.7% ion in Volume: 87.8% alization: None ng: No ning: No Electronic Signature(s) Signed: 12/08/2019 3:07:45 PM By: Carlene Coria RN Entered By: Carlene Coria on 08/31/2019 08:05:21 -------------------------------------------------------------------------------- Vitals Details Patient Name: Date of Service: [name] [name]. 08/31/2019 7:30 AM Medical Record Number:[id number] Patient Account Number: [id number] Date of Birth/Sex: Treating RN: [birth year] (68 y.o. Orvan Falconer Primary Care Markese Bloxham: [name] Other Clinician: Referring Samarrah Tranchina: Treating Aashika Carta/Extender:Robson, Leotis Shames, MARY Weeks in Treatment: 388 Vital Signs Time Taken: 07:52 Temperature (F): 97.9 Height (in): 68 Pulse (bpm): 76 Weight (lbs): 132 Respiratory Rate (breaths/min): 18 Body Mass Index (BMI): 20.1 Blood Pressure (mmHg): 133/68 Reference Range: 80 - 120 mg / dl Electronic Signature(s) Signed: 12/08/2019 3:07:45 PM By: Carlene Coria RN Entered By: Carlene Coria on 08/31/2019 07:52:42

## 2019-12-09 ENCOUNTER — Telehealth: Payer: Self-pay | Admitting: Internal Medicine

## 2019-12-09 MED ORDER — TRAMADOL HCL 50 MG PO TABS: 50.0000 mg | ORAL_TABLET | Freq: Two times a day (BID) | ORAL | 0 refills | Status: AC | PRN

## 2019-12-09 NOTE — Telephone Encounter (Signed)
Refill Tramadol. Tell her we should repeat CBC and magnesium in 2 weeks. Nurse can draw this. We will need to give an order to home health nurse. Can you call and do that?

## 2019-12-09 NOTE — Progress Notes (Signed)
[name] [name] ([id number]) Visit Report for 11/02/2019 Arrival Information Details Patient Name: Date of Service: [name] [name] 11/02/2019 8:00 AM Medical Record Number:[id number] Patient Account Number: [id number] Date of Birth/Sex: Treating RN: Mar 12, 1951 (68 y.o. Orvan Falconer Primary Care Ulice Follett: [name] Other Clinician: Referring Liese Dizdarevic: Treating Akeen Ledyard/Extender:Robson, Leotis Shames, MARY Weeks in Treatment: 397 Visit Information History Since Last Visit All ordered tests and consults were completed: No Patient Arrived: Ambulatory Added or deleted any medications: Yes Arrival Time: 08:58 Any new allergies or adverse reactions: No Accompanied By: husband Had a fall or experienced change in No Transfer Assistance: None activities of daily living that may affect Patient Identification Verified: Yes risk of falls: Secondary Verification Process Completed: Yes Signs or symptoms of abuse/neglect since last No Patient Requires Transmission-Based No visito Precautions: Hospitalized since last visit: No Patient Has Alerts: No Implantable device outside of the clinic excluding No cellular tissue based products placed in the center since last visit: Has Dressing in Place as Prescribed: Yes Pain Present Now: No Electronic Signature(s) Signed: 12/09/2019 12:05:18 PM By: Carlene Coria RN Entered By: Carlene Coria on 11/02/2019 08:59:17 -------------------------------------------------------------------------------- Encounter Discharge Information Details Patient Name: Date of Service: [name] [name]. 11/02/2019 8:00 AM Medical Record Number:[id number] Patient Account Number: [id number] Date of Birth/Sex: Treating RN: 08-02-1951 (68 y.o. [name] Primary Care Tawnia Schirm: [name] Other Clinician: Referring Ayo Guarino: Treating Muhammed Teutsch/Extender:Robson, Leotis Shames, MARY Weeks in Treatment: [phone] Encounter Discharge Information Items Post Procedure  Vitals Discharge Condition: Stable Temperature (F): 97.7 Ambulatory Status: Ambulatory Pulse (bpm): 79 Discharge Destination: Home Respiratory Rate (breaths/min): 18 Transportation: Private Auto Blood Pressure (mmHg): 131/53 Accompanied By: husband Schedule Follow-up Appointment: Yes Clinical Summary of Care: Electronic Signature(s) Signed: 11/02/2019 5:49:34 PM By: Deon Pilling Entered By: Deon Pilling on 11/02/2019 09:43:47 -------------------------------------------------------------------------------- Lower Extremity Assessment Details Patient Name: Date of Service: [name] [name] 11/02/2019 8:00 AM Medical Record Number:[id number] Patient Account Number: [id number] Date of Birth/Sex: Treating RN: July 19, 1951 (68 y.o. Orvan Falconer Primary Care Zionah Criswell: [name] Other Clinician: Referring Alexia Dinger: Treating Madison Albea/Extender:Robson, Leotis Shames, MARY Weeks in Treatment: 397 Edema Assessment Assessed: [Left: No] [Right: No] Edema: [Left: Ye] [Right: s] Calf Left: Right: Point of Measurement: 36 cm From Medial Instep cm 33 cm Ankle Left: Right: Point of Measurement: 10 cm From Medial Instep cm 21 cm Electronic Signature(s) Signed: 12/09/2019 12:05:18 PM By: Carlene Coria RN Entered By: Carlene Coria on 11/02/2019 09:09:48 -------------------------------------------------------------------------------- Multi Wound Chart Details Patient Name: Date of Service: [name] [name]. 11/02/2019 8:00 AM Medical Record Number:[id number] Patient Account Number: [id number] Date of Birth/Sex: Treating RN: Nov 19, 1951 (68 y.o. 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] [5:Venous Leg Ulcer] [7:Venous Leg Ulcer] Comorbid History: [14:Anemia, Hypertension, Peripheral Arterial Disease, Peripheral Arterial Disease, Peripheral Arterial Disease, Peripheral Venous Disease, Peripheral Venous Disease, Peripheral Venous Disease, Raynauds, Scleroderma, Raynauds,  Scleroderma, Raynauds, Scleroderma, Rheumatoid Arthritis, Osteoarthritis] [5:Anemia, Hypertension, Rheumatoid Arthritis, Osteoarthritis] [7:Anemia, Hypertension, Rheumatoid Arthritis, Osteoarthritis] Date Acquired: [14:11/03/2018] [5:01/13/2013] [7:06/25/2016] Weeks of Treatment: [14:52] [5:353] [7:173] Wound Status: [14:Open] [5:Open] [7:Open] Clustered Wound: [14:No] [5:Yes] [7:No] Measurements L x W x  D 0.2x0.3x0.2 [5:1.2x1.6x0.9] [7:1x0.6x0.3] (cm) Area (cm) : [14:0.047] [5:1.508] [7:0.471] Volume (cm) : [14:0.009] [5:1.357] [7:0.141] % Reduction in Area: [14:70.10%] [5:-6.60%] [7:81.60%] % Reduction in Volume: 71.00% [5:-862.40%] [7:44.70%] Classification: [14:Full Thickness Without Exposed Support Structures Exposed Support Structures Exposed Support Structures] [5:Full Thickness With] [7:Full Thickness Without] Exudate Amount: [14:Medium] [5:Medium] [7:Medium] Exudate Type: [14:Serosanguineous] [5:Serosanguineous] [7:Serosanguineous] Exudate Color: [14:red, brown] [5:red, brown] [7:red, brown] Wound Margin: [14:Distinct, outline attached Distinct, outline attached Distinct, outline attached] Granulation Amount: [14:Small (1-33%)] [5:Small (1-33%)] [7:None Present (0%)] Granulation Quality: [14:Pink, Pale] [5:Red, Pink] [7:N/A] Necrotic Amount: [14:Large (67-100%)] [5:Large (67-100%)] [7:Large (67-100%)] Exposed Structures: [14:Fat  Layer (Subcutaneous Fat Layer (Subcutaneous Fat Layer (Subcutaneous Tissue) Exposed: Yes Fascia: No Tendon: No Muscle: No Joint: No Bone: No] [5:Tissue) Exposed: Yes Muscle: Yes Fascia: No Tendon: No Joint: No Bone: No] [7:Tissue) Exposed: Yes  Fascia: No Tendon: No Muscle: No Joint: No Bone: No] Epithelialization: [14:Small (1-33%)] [5:Small (1-33%)] [7:None] Debridement: [14:N/A] [5:Debridement - Excisional Debridement - Excisional] Pre-procedure [14:N/A] [5:09:24] [7:09:24] Verification/Time Out Taken: Pain Control: [14:N/A] [5:Other] [7:Other] Tissue Debrided: [14:N/A] [5:Subcutaneous, Slough] [7:Subcutaneous, Slough] Level: [14:N/A] [5:Skin/Subcutaneous Tissue Skin/Subcutaneous Tissue] Debridement Area (sq cm):N/A [5:1.92] [7:0.6] Instrument: [14:N/A] [5:Curette] [7:Curette] Bleeding: [14:N/A] [5:Minimum] [7:Minimum] Hemostasis Achieved: [14:N/A] [5:Pressure] [7:Pressure] Procedural Pain: [14:N/A] [5:0] [7:0] Post Procedural Pain: [14:N/A] [5:0] [7:0] Debridement Treatment [14:N/A] [5:Procedure was tolerated] [7:Procedure was tolerated] Response: [5:well] [7:well] Post Debridement [14:N/A] [5:1.2x1.6x0.9] [7:1x0.6x0.3] Measurements L x W x D (cm) Post Debridement [14:N/A] [5:1.357] [7:0.141] Volume: (cm) Procedures Performed: [14:N/A] [5:Debridement] [7:Debridement] Treatment Notes Wound #14 (Right, Medial, Anterior Lower Leg) 1. Cleanse With Wound Cleanser Soap and water 2. Periwound Care Moisturizing lotion 3. Primary Dressing Applied Collagen Santyl 4. Secondary Dressing Dry Gauze 6. Support Layer Applied Kerlix/Coban Wound #5 (Right, Medial Lower Leg) 1. Cleanse With Wound Cleanser Soap and water 2. Periwound Care Moisturizing lotion 3. Primary Dressing Applied Collagen Santyl 4. Secondary Dressing Dry Gauze 6. Support Layer Applied Kerlix/Coban Wound #7 (Right, Lateral Malleolus) 1. Cleanse With Wound Cleanser Soap and water 2. Periwound  Care Moisturizing lotion 3. Primary Dressing Applied Collagen Santyl 4. Secondary Dressing Dry Gauze 6. Support Layer Holiday representative) Signed: 11/02/2019 5:57:33 PM By: Linton Ham MD Signed: 11/02/2019 6:00:40 PM By: Levan Hurst RN, BSN Entered By: Linton Ham on 11/02/2019 09:53:28 -------------------------------------------------------------------------------- Multi-Disciplinary Care Plan Details Patient Name: Date of Service: CHARMAINE, PLACIDO 11/02/2019 8:00 AM Medical Record Number:[id number] Patient Account Number: [id number] Date of Birth/Sex: Treating RN: [birth year] (68 y.o. [name] Primary Care Orma Cheetham: [name] Other Clinician: Referring Taft Worthing: Treating Simara Rhyner/Extender:Robson, Leotis Shames, MARY Weeks in Treatment: 397 Active Inactive Wound/Skin Impairment Nursing Diagnoses: Impaired tissue integrity Knowledge deficit related to ulceration/compromised skin integrity Goals: Patient/caregiver will verbalize understanding of skin care regimen Date Initiated: 10/11/2016 Target Resolution Date: 11/27/2019 Goal Status: Active Interventions: Assess patient/caregiver ability to obtain necessary supplies Assess patient/caregiver ability to perform ulcer/skin care regimen upon admission and as needed Assess ulceration(s) every visit Provide education on ulcer and skin care Treatment Activities: Skin care regimen initiated : 10/11/2016 Topical wound management initiated : 10/11/2016 Notes: Electronic Signature(s) Signed: 11/05/2019 5:16:49 PM By: Kela Millin Entered By: Kela Millin on 11/02/2019 09:19:06 -------------------------------------------------------------------------------- Pain Assessment Details Patient Name: Date of Service: [name] [name]. 11/02/2019 8:00 AM Medical Record Number:[id number] Patient Account Number: [id number] Date of Birth/Sex: Treating RN: Jul 07, 1951 (68 y.o. Orvan Falconer Primary Care Luda Charbonneau: [name] Other Clinician: Referring Delando Satter: Treating Roshawn Lacina/Extender:Robson, Leotis Shames, MARY Weeks in Treatment: [phone] Active Problems Location of  Pain Severity and Description of Pain Patient Has Paino No Site Locations Pain Management and Medication Current Pain Management: Electronic Signature(s) Signed: 12/09/2019 12:05:18 PM By: Carlene Coria RN Entered By: Carlene Coria on 11/02/2019 09:03:18 -------------------------------------------------------------------------------- Patient/Caregiver Education Details Patient Name: Date of Service: Boike, Pooja F. 11/2/2020andnbsp8:00 AM Medical Record Number:[id number] Patient Account Number: [id number] Date of Birth/Gender: Oct 16, 1951 (68 y.o. F) Treating RN: Kela Millin Primary Care Physician: [name] Other Clinician: Referring Physician: Treating Physician/Extender:Robson, Leotis Shames, Timoteo Expose in Treatment: [phone] Education Assessment Education Provided To: Patient Education Topics Provided Wound/Skin Impairment: Methods: Explain/Verbal Responses: State content correctly Electronic Signature(s) Signed: 11/05/2019 5:16:49 PM By: Kela Millin Entered By: Kela Millin on 11/02/2019 09:19:25 -------------------------------------------------------------------------------- Wound Assessment Details Patient Name: Date of Service: [name] [name]. 11/02/2019 8:00 AM Medical Record Number:[id number] Patient Account Number: [id number] Date of Birth/Sex: Treating RN: 12/30/1951 (68 y.o. Orvan Falconer Primary Care Bryanne Riquelme: [name] Other Clinician: Referring Taya Ashbaugh: Treating Erza Mothershead/Extender:Robson, Leotis Shames, MARY Weeks in Treatment: 397 Wound Status Wound Number: 14 Primary Venous Leg Ulcer Etiology: Wound Location: Right Lower Leg - Medial, Anterior Wound Open Wounding Event: Gradually Appeared Status: Date Acquired: 11/03/2018 Comorbid Anemia,  Hypertension, Peripheral Arterial Weeks Of Treatment: 52 History: Disease, Peripheral Venous Disease, Clustered Wound: No Raynauds, Scleroderma, Rheumatoid Arthritis, Osteoarthritis Photos Wound Measurements Length: (cm) 0.2 % Reduct Width: (cm) 0.3 % Reduct Depth: (cm) 0.2 Epitheli Area: (cm) 0.047 Tunneli Volume: (cm) 0.009 Undermi Wound Description Full Thickness Without Exposed Support Foul Od Classification: Structures Slough/ Wound Distinct, outline attached Margin: Exudate Medium Amount: Exudate Serosanguineous Type: Exudate red, brown Color: Wound Bed Granulation Amount: Small (1-33%) Granulation Quality: Pink, Pale Fascia Exp Necrotic Amount: Large (67-100%) Fat Layer Necrotic Quality: Adherent Slough Tendon Exp Muscle Exp Joint Expo Bone Expos or After Cleansing: No Fibrino Yes Exposed Structure osed: No (Subcutaneous Tissue) Exposed: Yes osed: No osed: No sed: No ed: No ion in Area: 70.1% ion in Volume: 71% alization: Small (1-33%) ng: No ning: No Electronic Signature(s) Signed: 11/04/2019 3:21:07 PM By: Mikeal Hawthorne EMT/HBOT Signed: 12/09/2019 12:05:18 PM By: Carlene Coria RN Entered By: Mikeal Hawthorne on 11/04/2019 09:02:33 -------------------------------------------------------------------------------- Wound Assessment Details Patient Name: Date of Service: [name] [name]. 11/02/2019 8:00 AM Medical Record Number:[id number] Patient Account Number: [id number] Date of Birth/Sex: Treating RN: October 24, 1951 (68 y.o. Orvan Falconer Primary Care Colbe Viviano: [name] Other Clinician: Referring Edita Weyenberg: Treating Sapir Lavey/Extender:Robson, Leotis Shames, MARY Weeks in Treatment: 397 Wound Status Wound Number: 5 Primary Venous Leg Ulcer Etiology: Wound Location: Right Lower Leg - Medial Wound Open Wounding Event: Gradually Appeared Status: Date Acquired: 01/13/2013 Comorbid Anemia, Hypertension, Peripheral Arterial Weeks Of Treatment:  353 History: Disease, Peripheral Venous Disease, Clustered Wound: Yes Raynauds, Scleroderma, Rheumatoid Arthritis, Osteoarthritis Photos Wound Measurements Length: (cm) 1.2 % Reductio Width: (cm) 1.6 % Reductio Depth: (cm) 0.9 Epithelial Area: (cm) 1.508 Tunneling Volume: (cm) 1.357 Undermini Wound Description Full Thickness With Exposed Support Foul Odor Classification: Structures Slough/Fi Wound Wound Distinct, outline attached Margin: Exudate Medium Amount: Exudate Type: Serosanguineous Exudate Color: red, brown Wound Bed Granulation Amount: Small (1-33%) Granulation Quality: Red, Pink Fascia E Necrotic Amount: Large (67-100%) Fat Laye Necrotic Quality: Adherent Slough Tendon E Muscle E Nec Joint Ex Bone Exp After Cleansing: No brino Yes Exposed Structure xposed: No r (Subcutaneous Tissue) Exposed: Yes xposed: No xposed: Yes rosis of Muscle: Yes posed: No osed: No n in Area: -6.6% n in Volume: -862.4% ization: Small (1-33%) : No ng: No Electronic Signature(s) Signed: 11/04/2019 3:21:07 PM By: Mikeal Hawthorne  EMT/HBOT Signed: 12/09/2019 12:05:18 PM By: Carlene Coria RN Entered By: Mikeal Hawthorne on 11/04/2019 09:03:19 -------------------------------------------------------------------------------- Wound Assessment Details Patient Name: Date of Service: HUGH, GARROW 11/02/2019 8:00 AM Medical Record Number:[id number] Patient Account Number: [id number] Date of Birth/Sex: Treating RN: Jul 02, 1951 (68 y.o. Orvan Falconer Primary Care Jalise Zawistowski: [name] Other Clinician: Referring Tanetta Fuhriman: Treating Coleta Grosshans/Extender:Robson, Leotis Shames, MARY Weeks in Treatment: 397 Wound Status Wound Number: 7 Primary Venous Leg Ulcer Etiology: Wound Location: Right Malleolus - Lateral Wound Open Wounding Event: Gradually Appeared Status: Date Acquired: 06/25/2016 Comorbid Anemia, Hypertension, Peripheral Arterial Weeks Of Treatment: 173 History: Disease,  Peripheral Venous Disease, Clustered Wound: No Raynauds, Scleroderma, Rheumatoid Arthritis, Osteoarthritis Photos Wound Measurements Length: (cm) 1 % Reduct Width: (cm) 0.6 % Reduct Depth: (cm) 0.3 Epitheli Area: (cm) 0.471 Tunneli Volume: (cm) 0.141 Undermi Wound Description Classification: Full Thickness Without Exposed Support Foul Od Structures Slough/ Wound Distinct, outline attached Margin: Exudate Medium Amount: Exudate Serosanguineous Type: Exudate red, brown Color: Wound Bed Granulation Amount: None Present (0%) Necrotic Amount: Large (67-100%) Fascia E Necrotic Quality: Adherent Slough Fat Laye Tendon E Muscle E Joint Ex Bone Exp or After Cleansing: No Fibrino Yes Exposed Structure xposed: No r (Subcutaneous Tissue) Exposed: Yes xposed: No xposed: No posed: No osed: No ion in Area: 81.6% ion in Volume: 44.7% alization: None ng: No ning: No Electronic Signature(s) Signed: 11/04/2019 3:21:07 PM By: Mikeal Hawthorne EMT/HBOT Signed: 12/09/2019 12:05:18 PM By: Carlene Coria RN Entered By: Mikeal Hawthorne on 11/04/2019 09:02:01 -------------------------------------------------------------------------------- Vitals Details Patient Name: Date of Service: [name] [name]. 11/02/2019 8:00 AM Medical Record Number:[id number] Patient Account Number: [id number] Date of Birth/Sex: Treating RN: [birth year] (69 y.o. Orvan Falconer Primary Care Caileb Rhue: [name] Other Clinician: Referring Tressa Maldonado: Treating Tylynn Braniff/Extender:Robson, Leotis Shames, MARY Weeks in Treatment: 397 Vital Signs Time Taken: 09:02 Temperature (F): 97.7 Height (in): 68 Pulse (bpm): 79 Weight (lbs): 132 Respiratory Rate (breaths/min): 18 Body Mass Index (BMI): 20.1 Blood Pressure (mmHg): 131/53 Reference Range: 80 - 120 mg / dl Electronic Signature(s) Signed: 12/09/2019 12:05:18 PM By: Carlene Coria RN Entered By: Carlene Coria on 11/02/2019 09:03:10

## 2019-12-09 NOTE — Telephone Encounter (Signed)
Called and spoke with [name] [name], let her know her magnesium is still low, ask if she was taking medication from [facility], she said no. So I expressed how important it was for her to take something for her magnesium, to build her strength up and I let her know that they had 3 different things for her to try, that they were going to give her some samples, she verbalized understanding and is going to call and have her husband go and pick it up.  I ask how she was doing and feeling, she stated that each day she feels a little better and is walking a little with the walker at least once a day in the house, and that therapy is coming out and helping.[name] [name] ask what her hemoglobin was so I let her know it was 9.1, that it was still low. [name] [name] then said she only has one traMADol (ULTRAM) 50 MG tablet Left, should she keep taking and if so she needs a refill.  Rescheduled CPE for 01/11/2020, to give her time to get stronger.

## 2019-12-10 ENCOUNTER — Encounter: Payer: Medicare Other | Admitting: Internal Medicine

## 2019-12-10 DIAGNOSIS — Y92009 Unspecified place in unspecified non-institutional (private) residence as the place of occurrence of the external cause: Secondary | ICD-10-CM

## 2019-12-10 DIAGNOSIS — R790 Abnormal level of blood mineral: Secondary | ICD-10-CM | POA: Diagnosis not present

## 2019-12-10 DIAGNOSIS — Z9981 Dependence on supplemental oxygen: Secondary | ICD-10-CM

## 2019-12-10 DIAGNOSIS — M349 Systemic sclerosis, unspecified: Secondary | ICD-10-CM

## 2019-12-10 DIAGNOSIS — W19XXXD Unspecified fall, subsequent encounter: Secondary | ICD-10-CM

## 2019-12-10 DIAGNOSIS — S52502D Unspecified fracture of the lower end of left radius, subsequent encounter for closed fracture with routine healing: Secondary | ICD-10-CM

## 2019-12-10 DIAGNOSIS — S3282XD Multiple fractures of pelvis without disruption of pelvic ring, subsequent encounter for fracture with routine healing: Secondary | ICD-10-CM

## 2019-12-10 DIAGNOSIS — D649 Anemia, unspecified: Secondary | ICD-10-CM | POA: Diagnosis not present

## 2019-12-10 NOTE — Addendum Note (Signed)
Addended by: Mady Haagensen on: 12/10/2019 10:27 AM   Modules accepted: Orders

## 2019-12-14 ENCOUNTER — Encounter (HOSPITAL_BASED_OUTPATIENT_CLINIC_OR_DEPARTMENT_OTHER): Payer: Medicare Other | Attending: Internal Medicine | Admitting: Internal Medicine

## 2019-12-14 ENCOUNTER — Other Ambulatory Visit: Payer: Self-pay

## 2019-12-14 DIAGNOSIS — M341 CR(E)ST syndrome: Secondary | ICD-10-CM | POA: Diagnosis not present

## 2019-12-14 DIAGNOSIS — M1990 Unspecified osteoarthritis, unspecified site: Secondary | ICD-10-CM | POA: Diagnosis not present

## 2019-12-14 DIAGNOSIS — I7300 Raynaud's syndrome without gangrene: Secondary | ICD-10-CM | POA: Diagnosis not present

## 2019-12-14 DIAGNOSIS — M069 Rheumatoid arthritis, unspecified: Secondary | ICD-10-CM | POA: Insufficient documentation

## 2019-12-14 DIAGNOSIS — Z882 Allergy status to sulfonamides status: Secondary | ICD-10-CM | POA: Diagnosis not present

## 2019-12-14 DIAGNOSIS — L97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: Secondary | ICD-10-CM | POA: Diagnosis present

## 2019-12-14 DIAGNOSIS — I2721 Secondary pulmonary arterial hypertension: Secondary | ICD-10-CM | POA: Diagnosis not present

## 2019-12-14 DIAGNOSIS — Z7902 Long term (current) use of antithrombotics/antiplatelets: Secondary | ICD-10-CM | POA: Diagnosis not present

## 2019-12-14 DIAGNOSIS — Z9981 Dependence on supplemental oxygen: Secondary | ICD-10-CM | POA: Diagnosis not present

## 2019-12-14 DIAGNOSIS — L89616 Pressure-induced deep tissue damage of right heel: Secondary | ICD-10-CM | POA: Insufficient documentation

## 2019-12-14 DIAGNOSIS — L97811 Non-pressure chronic ulcer of other part of right lower leg limited to breakdown of skin: Secondary | ICD-10-CM | POA: Diagnosis not present

## 2019-12-14 DIAGNOSIS — I739 Peripheral vascular disease, unspecified: Secondary | ICD-10-CM | POA: Diagnosis not present

## 2019-12-14 DIAGNOSIS — Z880 Allergy status to penicillin: Secondary | ICD-10-CM | POA: Diagnosis not present

## 2019-12-14 DIAGNOSIS — Z87891 Personal history of nicotine dependence: Secondary | ICD-10-CM | POA: Insufficient documentation

## 2019-12-14 DIAGNOSIS — I83222 Varicose veins of left lower extremity with both ulcer of calf and inflammation: Secondary | ICD-10-CM | POA: Diagnosis not present

## 2019-12-14 DIAGNOSIS — Z881 Allergy status to other antibiotic agents status: Secondary | ICD-10-CM | POA: Diagnosis not present

## 2019-12-14 DIAGNOSIS — I10 Essential (primary) hypertension: Secondary | ICD-10-CM | POA: Diagnosis not present

## 2019-12-14 DIAGNOSIS — S81801A Unspecified open wound, right lower leg, initial encounter: Secondary | ICD-10-CM | POA: Diagnosis not present

## 2019-12-14 DIAGNOSIS — S91001A Unspecified open wound, right ankle, initial encounter: Secondary | ICD-10-CM | POA: Diagnosis not present

## 2019-12-14 DIAGNOSIS — M349 Systemic sclerosis, unspecified: Secondary | ICD-10-CM | POA: Insufficient documentation

## 2019-12-14 NOTE — Progress Notes (Signed)
THERSIA, PETRAGLIA ([id number]) Visit Report for 12/14/2019 HPI Details Patient Name: Date of Service: TEAGYN, FISHEL 12/14/2019 9:00 AM Medical Record Number:[id number] Patient Account Number: [id number] Date of Birth/Sex: Treating RN: 1951/10/31 (68 y.o. [name] Primary Care Provider: [name] Other Clinician: Referring Provider: Treating Provider/Extender:Montae Stager, Leotis Shames, MARY Weeks in Treatment: 403 History of Present Illness HPI Description: this is a patient who initially came to [place] for wounds on the medial malleoli bilaterally as well as her upper medial lower extremities bilaterally. These wounds eventually healed with assistance of Apligraf's bilaterally. While this was occurring she developed the current wound which opened into a fairly substantial wound on the right lower extremity. These are mostly secondary to venous stasis physiology however the patient also has underlying scleroderma, pulmonary hypertension. The wound has been making good progress lately with the Hydrofera Blue-based dressing. 03/17/2015; patient had a arterial evaluation a year ago. Her right ABI was 0.86 left was 1.0. Toe brachial index was 0.41 on the right 0.45 on the left. Her bilateral common femoral artery waveforms were triphasic. Her white popliteal posterior tibial artery and anterior tibial artery waveforms were monophasic with good amplitude. Luteal artery waveforms were biphasic it was felt that her bilateral great toe pressures are of normal although adequate for tissue healing. 03/24/2015. The condition of this wound is not really improved that. He was covered with as fibrinous surface slough and eschar. This underwent an aggressive debridement with both a curette and scalpel. I still cannot really get down to what I can would consider to be a viable surface. There is no evidence of infection. Previous workup for ischemia roughly a year ago was negative nevertheless I think  that continues to be a concern 04/07/15. The patient arrived for application of second Apligraf. Once again the surface of this wound is certainly less viable than I would like for an advanced treatment option. An aggressive debridement was done. She developed some arteriolar bleeding which required that along pressure and silver alginate. 04/21/15: change in the condition of this wound. Once again it is covered in a gelatinous surface slough. After debridement today surface of the wound looks somewhat better but now a heeling surface 05/05/15 Apligraf #4 applied.Still a lot of slough on this wound. 05/19/15 Apligraf #5 applied. Still a lot of slough on this wound I did not aggressively remove this 06/02/15; continued copious amounts of surface slough. This debridement fairly easily. 06/08/2015 -- the last time she had a venous duplex study done was over 3 years ago and the surgery was prior to that. I have recommended that she sees Dr. early for a another opinion regarding a repeat venous duplex and possibly more endovenous ablation of vein stripping of micro-phlebotomies. 06/16/15; wound has a gelatinous surface eschar that the debridement fairly easily to a point. I don't disagree with the venous workup and perhaps even arterial re-evaluation. She is on prednisone 5 mg and continue his medications for her pulmonary artery hypertension I am not sure if the latter have any wound care healing issues I would need to investigate this. 06/23/15 continues with a gelatinous surface eschar with of fibrinous underlying. What I can see of this wound does not look unhealthy however I just can't get this material which I think is 2 different layers off. Empiric culture done 06/30/15; unfortunately not a lot of change in this wound. A gelatinous surface eschar is easily removed however it has a tight fibrinous surface underneath the. 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 The wound comes back and  basically and unchanged state the. She has a gelatinous surface that is more easily removed however there is a tightly fibrinous surface underneath the. There is no evidence of infection. She has a vascular follow-up next month. I would have to inject her in order to do a more aggressive debridement of this area 07/21/15 the wound is roughly in the same state albeit the debridement is done with greater ease. There is less of the fibrinous eschar underneath the. There is no evidence of infection. She has follow-up with vascular surgery next week. No evidence of surrounding infection. Her original distal wounds healed while this one formed. 07/28/15; wound is easier to debride. No wound erythema. She is seeing Dr. [name] next week. 08/18/15 Has been to [place] for repeat ablation. Have been using medihoney pad with some improvement 08/25/15; absolutely no change in the condition of this wound in either its overall size or surface condition in many months now. At one point I had this down to [place] healthier surface I think with [facility] however this did not actually progress towards closure. Do not believe that the wound has actually deteriorated in terms of volume at all. We have been using a medihoney pad which allows easier debridement of the gelatinous eschar but again no overall actual improvement. the patient is going towards an ablation with pain and vascular which I think is scheduled for next week. The only other investigation that I could first see would be a biopsy. She does have underlying scleroderma 09/02/15 eschar is much easier to debridement however the base of this does not look particularly vibrant. We changed to Iodoflex. The patient had her ablation earlier this week 09/09/15 again the debridement over the base of this wound is easier and the base of the wound looks considerably better. We will continue the Iodoflex. Dr. Tawni Millers has expressed his satisfaction with the result of  her ablation 09/15/15 once again the wound is relatively free of surface eschar. No debridement was done today. It has a pale- looking base to it. although this is not as deep as it once was it seems to be expanding especially inferiorly. She has had recent venous ablation but this is no closer to healing.I've gone ahead and done a punch biopsy this from the inferior part of the wound close to normal skin 09/22/15: the wound is relatively free of surface eschar. There is some surrounding eschar. I'm not exactly sure at what level the surface is that I am seeing. 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 No evidence of atypical infection, malignancy. 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 No debridement was done 09/29/15; the wound surface is relatively free of surface eschar. There is eschar surrounding the walls of the wound. Aside from the improvement in the amount of surface slough. This wound has not progressed any towards closure. There is not even a surface that looks like there at this is ready. There is no evidence of any infection nor maligancy based on biopsy I did on 9/15. I continued with the Iodoflex however I am looking towards some alternative to try to promote some closure or filling in of this surface. Consider triple layer [place]. Collagen did not result in adequate control of the surface slough 10/13/15; the patient was in hospital last week with severe anemia. The wound looks somewhat better after debridement although there is widening  medially. There is no evidence of infection. 10/20/15; patient's wound on the right lateral lower leg is essentially unchanged. This underwent a light surface debridement and in general the debridement is easier and the surface looks improved. I noted in doing this on the side of the wound what appears to be a piece of calcium deposition. The patient noted that she had previous things on  the tips of her fingers. 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 She follows with rheumatology and I have asked them to talk to her about this. In view of that S the nonhealing ulcer may have something to do with calcinosis and also unrelieved Raynaud's disease in this area. I should note that her original wounds on the right and left medial malleolus and the inner aspects of both legs just below the knees did however heal over 10/30/15; the patient's wound on the right lateral lower leg is essentially unchanged. I was able to remove some calcified material from the medial wound edge. 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 Otherwise the wound appears essentially unchanged there is less adherent surface eschar. Some of the calcified material was sent to pathology for analysis 11/17/15. The patient's wound on the right lateral leg is essentially unchanged. Wider Medially. 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 There is some surface slough but I did not debridement this. No evidence of surrounding infection. 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 This will make healing difficult 12/15/15; the wound has less depth surface has a fibrinous slough and calcifications in the wound edge no evidence of infection 12/22/15; the wound definitely has less Fibrinous slough on the base. Calcifications around the wound edges are still evident. Although the wound bed looks healthier it is still pale in appearance. Previous biopsy did not show malignancy 01/04/15; surgical debridement of nonviable  slough and subcutaneous tissue the wound cleans up quite nicely but appears to be expanding outward calcifications around the wound edges are still there. 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 She is to see her rheumatologist I'll see if he has any opinions on this. My punch biopsy done in September did not show calcifications although these are clearly evident. 01/19/16 light selective debridement of nonviable surface slough. There is epithelialization medially. This gives me reason for cautious optimism. She has been to see her rheumatologist, there is nothing that can be done for this type of soft tissue calcification associated with scleroderma 02/02/16 no debridement although there is a light surface slough. She has 2 peninsulas of skin 1 inferiorly and one medially. We continue to make a slight and slow but definite progressive here 02/16/16; light surface debridement with more attention to the circumference of the wound bed where the fibrinous eschar is more prevalent. No calcifications detected. She seems to have done nicely with the [facility] with some epithelialization and some improvement in the overall wound volume. 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 Continued attention to the circumference of the wound where the fibrinous  eschar in calcinosis or prevalent. No calcifications were detected. She has continued improvement with Hydrofera Blue. The wound is no longer as deep 03/15/16 surgical debridement done to remove surface escha Especially around the circumference of the wound where there is nonviable subcutaneous tissue. In spite of this there is considerable improvement in the overall dimensions and depth of the wound. Islands of epithelialization are seen especially medially inferiorly and superiorly to a  lesser extent. She is using Hydrofera Blue at home 03/29/16; surgical debridement done to remove surface eschar and nonviable subcutaneous tissue. This cleans up quite nicely mention slightly larger in terms of length and width however depth is less 04/12/16; continued gradual improvement in terms of depth and the condition of the wound base. Debridement is done. Continuing long standing Hydrofera Blue at home with Kerlix Coban wraps 04/26/16; continued gradual improvement in terms of depth and management as well as condition of the wound base. Surgical debridement done she continues with Hydrofera Blue. 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 She initially came to this clinic venous insufficiency ulcers which have long since healed 05/17/16 continued improvement in terms of the depth and measurements of this wound although she has a tightly adherent fibrinous slough each time. We've been continued with long standing Hydrofera Blue which seems to done as well for this wound is many advanced treatment options. 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 She also has chronic venous insufficiency. She has an irritation on her lateral right ankle secondary to our wraps 05/10/16; wound appears to be smaller especially on the medial aspect and especially in the width. Wound was debridement surface looks better. She is also been in the hospital apparently with anemia again she tells me she had an endoscopy. Since she got home after 3 days which I believe was sometime last week she has had an irritated painful area on the right lateral ankle surrounding the lateral malleolus 05/31/16; much more adherent surface slough today then recently although I don't think the dimensions of changed that much. A more aggressive debridement is required. The irritated area over her medial malleolus is more pruritic and painful and I don't think  represents cellulitis 06/14/16; no major change in her wound dimensions however there is more tightly adherent surface slough which is disappointing. As well as she appears to have a new small area medially. Furthermore an irritated uncomfortable area on the lateral aspect of her right foot just below the lateral malleolus. 06/21/16; I'm seeing the patient back in follow-up for the new areas under her major wound on the right anterior leg. She has been using Hydrofera Blue to this area probably for several months now and although the dressings seem to be helping for quite a period of time I think things have stagnated lately. She comes in today with a relatively tight adherent surface slough and really no changes in the wound shape or dimension. The 2 small areas she had inferiorly are tiny but still open they seem improved this well. There is no uncontrolled edema and I don't think there is any evidence of cellulitis. 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 Patient has had vascular evaluations both venous and arterial. I have biopsied this area. There is no obvious infection. The worrisome thing today is that she seems to be developing areas of  erythema and epithelial damage on the medial aspect of the right foot. Also to a lesser degree inferior to the actual wound itself. Again I see no obvious changes to suggest cellulitis however as this is the only treatable option I will probably give her antibiotics. 07/13/16 no major change in the lady's large anterior right leg wound. Still covered with a very tightly adherent surface slough which is difficult to debridement. There is less erythema around this, culture last week grew pseudomonas I gave her ciprofloxacin. The area on her lateral right malleolus looks better- 08/02/16 the patient's wounds continued to decline. Her original large  anterior right leg wound looks deeper. Still adherent surface slough that is difficult to debridement. She has a small area just below this and a punched-out wound just below her lateral malleolus. In the meantime she is been in hospital with apparently an upper GI bleed on Plavix and aspirin. She is now just on Plavix she received 3 units of packed cells 08/23/16; since I last saw this 3 weeks ago, the open large area on her right leg looks about the same syrup. She has a small satellite lesion just underneath this. The area on her medial right ankle is now a deep necrotic wound. I attempted to debridement this however there is just too much pain. 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 She follows with vascular surgery and is scheduled for an angiogram in early September 09/06/16; the patient is going for an arteriogram tomorrow. Her original large wound on the right calf is about the same the satellite lesion underneath it is about the same however the area on her medial ankle is now deeper with exposed tendon. I am no longer attempting to debride these wounds 09/20/16; the patient has undergone a right femoral endarterectomy and Dacron patch angioplasty. This seems to have helped the flow in her right leg. 10/04/16; Arrived today for aggressive debridement of the wounds on her right calf the original wound the one beneath it and a difficult area over her right lateral leg just above the lateral malleolus. 10/25/16; her 3 open wounds are about the same in terms of dimensions however the surface appears a lot healthier post debridement. Using Iodoflex 10/18/16 we have been using Iodoflex to her wounds which she tolerated with some difficulty. 10/11/16; has been using Santyl for a period of time with some improvement although again very adherent surface slough would prevent any attempted healing this. She has a original wound  on the left calf, the satellite underneath that and the most recent wound on the right medial ankle. She has completed revascularization by Dr. early and has had venous ablation earlier. Want to go back to Iodoflex to see if week and get a healthier surface to this wound bed failing this I think she'll need to be taken to the OR and I am prepared to call Dr. Marla Roe to discuss this. She is obviously not a good candidate for general anesthesia however.; 11/08/16; I put her on Iodoflex last time to see if I can get the wound bed any healthier and unfortunately today still had tremendous surface slough. 11/15/16; 4 weeks' worth of Iodoflex with not much improvement. Debridement on the major wounds on her left anterior leg is easier however this does not maintain from week to week. The punched-out area on her medial right ankle 11/29/16; I attempted to change the patient last visit to  Hydrofera Blue however she states this burned and was very uncomfortable therefore we gave her permission to go back to the eye out of complex which she already had at home. Also she noted a lot of pain and swelling on the lateral aspect of her leg before she traveled to [facility] for the holidays, I called her in doxycycline over the phone. This seems to have helped 12/06/16; Wounds unchanged by in large. Using Iodoflex 12/13/16; her wounds today actually looks somewhat better. The area on the right lateral lower leg has reasonably healthy-looking granulation and perhaps as actually filled and a bit. Debridement of the 2 wounds on the medial calf is easier and post debridement appears to have a healthier base. We have referred her to Dr. Migdalia Dk for consideration of operative debridement 12/20/16; we have a quick note from Dr. Merri Ray who feels that the patient needs to be referred to an academic center/plastic surgeon. This is due to the complexity of the patient's medical issues as it  applies to anything in the OR. We have been using Iodoflex 12/27/16; in general the wounds on the right leg are better in terms of the difficult to remove surface slough. She has been using Iodoflex. She is approved for Apligraf which I anticipated ordering in the next week or 2 when we get a better-looking surface 01/04/16 the deep wounds on the right leg generally look better. Both of them are debrided further surface slough. The area on the lateral right leg was not debrided. She is approved for Apligraf I think I'll probably order this either next week or the week after depending on the surface of the wounds superiorly. We have been using Iodoflex which will continue until then 01/11/16; the deep wounds on the right leg again have a surface slough that requires debridement. I've not been able to get the wound bed on either one of these wounds down to what would be acceptable for an advanced skin stab- like Apligraf. The area on the medial leg has been improving. We have been using hot Iodoflex to all wounds which seems to do the best at at least limiting the nonviable surface 01/24/17; we have continued Iodoflex and all her wound areas. Her debridement Gen. he is easier and the surface underneath this looks viable. Nevertheless these are large area wounds with exposed muscle at least on the anterior parts. We have ordered Apligraf's for 2 weeks from now. The patient will be away next week 02/07/17; the patient was close to have first Apligraf today however we did not order one. I therefore replaced her Iodoflex. She essentially has 3 large punched-out areas on her right anterior leg and right medial ankle. 02/11/17; Apligraf #1 02/25/17; Apligraf #2. In general some improvement in the right medial ankle and right anterior leg wounds. The larger wound medially perhaps some better 03/11/17; Apligraf #3. In general continued improvement in the right medial ankle and the right anterior leg  wounds. 03/25/17 Apligraf #4. In general continued improvement especially on the right medial ankle and the lower 04/08/17; Apligraf #5 in general continued improvement in all wound sites. 04/22/17; post Apligraf #5 her wound beds continued to look a lot better all of them up to the surface of the surrounding skin. Had a caramel-colored slough that I did not debrided in case there is residual Apligraf effect. The wounds are as good as I have seen these looking quite some time. 04/29/17; we applied [place] and last week after completing  her Apligraf. Wounds look as though they've contracted somewhat although they have a nonviable surface which was problematic in the past. Apparently she has been approved for further Apligraf's. We applied Iodosorb today after debridement. 05/06/17; we're fortunate enough today to be able to apply additional Apligraf approved by her insurance. In general all of her wounds look better Apligraf #6 05/24/17; Apligraf #7 continued improvement in all wounds 3 06/10/17 Apligraf #8. Continued improvement in the surface of all wounds. Not much of an improvement in dimensions as I might a follow 06/24/17 Apligraf #9 continued general improvement although not as much change in the wound areas I might of like. She has a new open area on the right anterior lateral ankle very small and superficial. She also has a necrotic wound on the tip of her right index finger probably secondary to severe uncontrolled Raynaud's phenomenon. She is already on sildenafil and already seen her rheumatologist who gave her Keflex. 07/08/17; Apligraf #10. Generalized improvement although she has a small additional wound just medial to the major wound area. 07/22/17; after discussion we decided not to reorder any further Apligraf's although there is been considerable improvement with these it hasn't been recently. The major wound anteriorly looks better. Smaller wounds beneath this and the more recent  one and laterally look about the same. The area on the right lateral lower leg looks about the same 07/29/17 this is a patient who is exceedingly complex. She has advanced scleroderma, crest syndrome including calcinosis, PAD status post revascularization, chronic venous insufficiency status post ablations. She initially presented to this clinic with wounds on her bilateral lower legs however these closed. More recently we have been dealing with a large open area superiorly on the right anterior leg, a smaller wound underneath this and then another one on the right medial lower extremity. These improve significantly with 10 Apligraf applications. Over the last 2-3 weeks we are making good progress with Hydrofera Blue and these seem to be making progress towards closure 08/19/17; wounds continued to make good improvement with Hydrofera Blue and episodic aggressive debridements 08/26/17; still using Hydrofera Blue. Good improvements 09/09/17; using Hydrofera Blue continued improvement. Area on the lateral part of her right leg has only a small remaining open area. The small inferior satellite region is for all intents and purposes closed. Her major wound also is come in in terms of depth and has advancing epithelialization. 09/16/17; using Hydrofera Blue with continued improvement. The smaller satellite wound. We've closed out today along with a new were satellite wound medially. The area on her medial ankle is still open and her major wound is still open but making improvement. All using Hydrofera Blue. Currently 09/30/17; using Hydrofera Blue. Still a small open area on the lateral right ankle area and her original major wound seems to be making gradual and steady improvement. 10/14/17; still using Hydrofera Blue. Still too small open areas on the right lateral ankle. Her original major wound is horizontal and linear. The most problematic area paradoxically seems to be the area to the medial area wears  I thought it would be the lateral. 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 10/28/17; still using Hydrofera Blue. Right lateral ankle has a very small open area superiorly on the most lateral part of the wound. Her original open wound has 2 open areas now separated by normal skin and we've redefined this. 11/11/17; still using Hydrofera Blue area and right lateral ankle continues  to have a small open area on mostly the lateral part of the wound. Her original wound has 2 small open areas now separated by a considerable amount of normal skin 11/28/17; the patient called in slightly before Thanksgiving to report pain and erythema above the wound on the right leg. In the past this is responded well to treatment for cellulitis and I gave her over the phone doxycycline. She stated this resulted in fairly abrupt improvement. We have been using Hydrofera Blue for a prolonged period of time to the larger wound anteriorly into the remaining wound on her right right lateral ankle. The latter is just about closed with only a small linear area and the bottom of the [place]. 12/02/17; use endoform and left the dressing on since last visit. There is no tenderness and no evidence of infection. 12/16/17; patient has been using Endoform but not making much progress. The 2 punched out open areas anteriorly which were the reminiscence of her major wound appear deeper. The area on the lateral aspect of her right calf also appears deeper. Also she has a puzzling tender swelling above her wound on the right leg. This seems larger than last time. Just above her wounds there appears to be some fluctuance in this area it is not erythematous and there is no crepitus 12/30/17; patient has been using Endoform up until last week we used Hydrofera Blue. Ultrasound of the swelling above her 2 major wounds last time was negative for a fluid collection. I gave her cefaclor for the  erythema and tenderness in this area which seems better. Unfortunately both punched out areas anteriorly and the area on her right medial lower leg appear deeper. In fact the lateral of the wounds anteriorly actually looks as though it has exposed tendon and/or muscle sheath. She is not systemically unwell. She is complaining of vaginitis type symptoms presumably Candida from her antibiotics. 01/06/18; we're using santyl. she has 2 punched out areas anteriorly which were initially part of a large wound. Unfortunately medially this is now open to tendon/muscle. All the wounds have the same adherent very difficult to debride surface. 01/20/18; 2 week follow-up using Santyl. She has the 2 punched out areas anteriorly which were initially part of her large surface wound there. Medially this still has exposed muscle. All of these have the same tightly adherent necrotic surface which requires debridement. PuraPly was not accepted by the patient's insurance however her insurance I think it changed therefore we are going to run Apligraf to gain 02/03/18; the patient has been using Santyl. The wound on the right lateral ankle looks improved and the 2 areas anteriorly on the right leg looks about the same. The medial one has exposed muscle. The lateral 1 requiresdebridement. We use PuraPly today for the first week 02/10/18; PuraPly #2. The patient has 3 wounds. The area on the right lateral ankle, 2 areas anteriorly that were part of her original large wound in this area the medial one has exposed muscle. All of the wounds were lightly debrided with a number 3 curet. PuraPly #2 applied the lateral wound on the calf and the right lateral ankle look better. 02/17/18; PuraPly #3. Patient has 3 wounds. The area on the right lateral ankle in 2 areas internally that were part of her original large wound. The lateral area has exposed muscle. She arrived with some complaints of pain around the right ankle. 02/24/18;  PuraPly #4; not much change in any of the 3 wound areas. Right  lateral ankle, right lateral calf. Both of these required debridement with a #3 curet. She tolerates this marginally. The area on the medial leg still has exposed muscle. Not much change in dimensions 03/03/18 PuraPly #5. The area on the medial ankle actually looks better however the 2 separate areas that were original parts of the larger right anterior leg wound look as though they're attempting to coalesce. 03/10/18; PuraPly #6. The area on the medial ankle actually continues to look and measures smaller however the 2 separate areas that were part of the original large wound on the right anterior leg have now coalesced. There hasn't been much improvement here. The lateral area actually has underlying exposed muscle 03/17/18-she is here in follow-up evaluation for ulcerations to the right lower extremity. She is voicing no complaints or concerns. She tolerated debridement. Puraply#7 placed 03/24/18; difficult right lower extremity ulcerations. PuraPly #8 place. She is been approved for [employer]. She did very well with Apligraf today however she is apparently reached her "lifetime max" 03/31/18; marginal improvement with PuraPly although her wounds looked as good as they have in several weeks today. Used TheraSkins #1 04/14/18 TheraSkin #2 today 04/28/18 TheraSkins #3. Wound slightly improved 05/12/18; TheraSkin skin #4. Wound response has been variable. 05/27/18 TheraSkin #5. Generally improvement in all wound areas. I've also put her in 3 layer compression to help with the severe venous hypertension 06/09/18; patient is done quite well with the TheraSkins unfortunately we have no further applications. I also put her in 3 layer compression last week and that really seems to of helped. 06/16/18; we have been using silver collagen. Wounds are smaller. Still be open area to the muscle layer of her calf however even that is contracted  somewhat. She tells me that at night sometimes she has pain on the right lateral calf at the site of her lower wound. Notable that I put her into 3 layer compression about 3 weeks ago. She states that she dangles her leg over the bed that makes it feel better but she does not describe claudication during the day She is going to call her secondary insurance to see if they will continue to cover advanced treatment products I have reviewed her arterial studies from 01/22/17; this showed an ABI in the right of 1 and on the left noncompressible. TBI on the right at 0.30 on the left at 0.34. It is therefore possible she has significant PAD with medial calcification falsely elevating her ABI into the normal range. I'll need to be careful about asking her about this next week it's possible the 3 layer compression is too much 06/23/18; was able to reapply TheraSkin 1 today. Edema control is good and she is not complaining of pain no claudication 07/07/18;no major change. New wound which was apparently a taper removal injury today in our clinic between her 2 wounds on the right calf TheraSkins #2 07/14/18; I think there is some improvement in the right lateral ankle and the medial part of her wound. There is still exposed muscle medially. 07/28/18; two-week follow-up. TheraSkins#3. Unfortunately no major change. 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 She seems to be doing well and in the base of the wound appears to show some progress at this point. This is the last approved [employer]askin of the second set. 08/25/18; she  has completed TheraSkin. There has been some improvement on the right lateral calf wound as well as the anterior leg wounds. The open area to muscle medially on the anterior leg wound is smaller. I'm  going to transition her back to [facility] under Kerlix Coban change every second day. She reports that she had some calcification removed from her right upper arm. We have had previous problems with calcifications in her wounds on her legs but that has not happened recently 09/08/18;using Hydrofera Blue on both her wound areas. Wounds seem to of contracted nicely. She uses Kerlix Coban wrap and changes at home herself 09/22/2018; using Hydrofera Blue on both her wound areas. Dimensions seem to have come down somewhat. There is certainly less depth in the medial part of the mid tibia wound and I do not think there is any exposed muscle at this point. 10/06/2018; 2-week follow-up. Using [facility] on her wound areas. Dimensions have come down nicely both on the right lateral ankle area in the right mid tibial area. She has no new complaints 10/20/2018; 2-week follow-up. She is using Hydrofera Blue. Not much change from the last time she was here. The area on the lateral ankle has less depth although it has raised edges on one side. I attempted to remove as much of the raised edge as I could without creating more additional wounds. The area on the right anterior mid tibia area looks the same. 11/03/2018; 2-week follow-up she is using Hydrofera Blue. On the right anterior leg she now has 2 wounds separated by a large area of normal skin. The area on the medial part still has I think exposed muscle although this area itself is a lot smaller. The area laterally has some depth. Both areas with necrotic debris. The area on her right lateral ankle has come in nicely 11/17/2018; patient continues to use Hydrofera Blue. We have been increasing separation of the 2 wounds anteriorly which were at one point joint the area on the right lateral calf continues to have I think some improvement in depth. 12/08/2018; patient continues to use Hydrofera Blue. There is some improvement in the area on the right  lateral calf. The 2 areas that were initially part of the original large wound in the mid right tibia are probably about the same. In fact the medial area is probably somewhat larger. We will run puraply through the patient's insurance 12/22/2018; she has been using Hydrofera Blue. We have small wounds on the right lateral calf and 2 small areas that were initially part of a large wound in the right mid tibia. We applied pure apply #1 today. 12/29/2018; we applied puraply #2. Her wounds look somewhat better especially on the right lateral calf and the lateral part of her original wound in the mid tibial area. 01/05/2019; perhaps slightly improved in terms of wound bed condition but certainly not as much improvement as I might of liked. Puraply #3 1/13: we did not have a correct sized puraply to apply. wounds more pinched out looking, I increased her compression to 3 layer last week to help with significant multilevel venous hypertension. Since then I've reviewed her arterial status. She has a right femoral endarterectomy and a distal left SFA stent. She was being followed by Dr. [name] for a period however she does not appear to have seen him in 3 years. I will set up an appointment. 1/20. The patient has an additional wound on the right lateral calf between the distal wound  and proximal wounds. We did not have Puraply last week. Still does not have a follow-up with Dr. early 1/27: Follow-up with Dr. early has been arranged apparently with follow-up noninvasive studies. Wounds are measuring roughly the same although they certainly look smaller 2/3; the patient had non invasive studies. Her ABIs on the right were 0.83 and on the left 1.02 however there was no great toe pressure bilaterally. Also worrisome monophasic waveforms at the PTA and dorsalis pedis. We are still using Puraply. We have had some improvement in all of the wounds especially the lateral part of the mid tibial area. 2/10; sees Dr.  early of vein and vascular re-arterial studies next week. Puraply reapplied today. 2/17; Dr. early of vein and vascular his appointment is tomorrow. Puraply reapplied after debridement of all wounds 2/24; the patient saw Dr. early I reviewed his note. sHe noted the previous right femoral endarterectomy with a Dacron patch. He also noted the normal ABI and the monophasic waveforms suggesting tibial disease. Overall he did not feel that she had any evidence of arterial insufficiency that would impair her wound healing. He did note her venous disease as well. He suggested PRN follow-up. 3/2; I had the last puraply applied today. The original wounds over the mid tibia area are improved where is the area on the right lower leg is not 3/9; wounds are smaller especially in the right mid tibia perhaps slightly in the right lateral calf. We finished with puraply and went to endoform today 3/23; the patient arrives after 2 weeks. She has been using endoform. I think all of her wounds look slightly better which includes the area on the right lateral calf just above the right lateral malleolus and the 2 in the right mid tibia which were initially part of the same wound. 4/27; TELEHEALTH visit; the patient was seen for telehealth visit today with her consent in the middle of the worldwide epidemic. Since she was last here she called in for antibiotics with pain and tenderness around the area on the right medial ankle. I gave her empiric doxycycline. She states this feels better. She is using endoform on both of these areas 5/11 TELEHEALTH; the patient was seen for telehealth visit today. She was accompanied at home by her husband. She has severe pulmonary hypertension accompanied scleroderma and in the face of the Covid epidemic cannot be safely brought into our clinic. We have been using endoform on her wound areas. There are essentially 3 wound areas now in the left mid tibia now 2 open areas that it one  point were connected and one on the right lateral ankle just above the malleolus. 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 She cannot come to clinic. The wounds were reviewed today via telehealth. She has severe chronic venous hypertension which I think is centrally mediated. She has wounds on her right anterior tibia and right lateral ankle area. These are chronic. She has been using endoform. 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 We have been following her from telehealth. She has severe chronic venous hypertension which may be mostly centrally mediated secondary to right heart heart failure. She has wounds on her right anterior tibia and right lateral  ankle these are chronic we have been using endoform 6/22; TELEHEALTH; this patient was seen today via telehealth. 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 Since the last time we had contact with this patient she developed some pain and erythema around the wound on her right lateral malleolus/ankle and we put in antibiotics for her. This is resulted in good improvement with resolution of the erythema and tenderness. I changed her to silver alginate last time. We had been using endoform for an extended period of time 7/13; TELEHEALTH; this patient was seen today via telehealth. 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 She would continue to be at a prohibitive risk to be brought into our clinic unless this was absolutely necessary. These visits have been done with her approval as well as her husband. We  have been using silver alginate to the areas on the right mid tibia and right lateral lower leg. 7/27 TELEHEALTH; patient was seen along with her husband today via telehealth. 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 We changed her to sample last visit. She has 2 areas a chronic wound on her right mid tibia and one just above her ankle. These were not the original wounds when she came into this clinic but she developed them during treatment 8/17; she comes in for her first face-to-face visit in a long period. She has a remaining area just medial to the right tibia which is the last open part of her large wound across this area. She also has an area on the right lateral lower leg. We prescribed Santyl last telehealth visit but they were concerned that this was making a deeper so they put silver alginate on it last week. Her husband changes the dressings. 8/31; using Santyl to the 2 wound areas some improvement in wound surfaces. Husband has surgery in 2 weeks we will put her out 3 weeks. Any of the advanced treatment options that I can think of that would be eligible for this wound would also cause her to have to come in weekly. The risk that the patient is just too high 9/21. Using Santyl to the 2 wound areas. Both of these are somewhat better although the medial mid tibia area still has exposed muscle. Lateral ankle requiring debridement. Using Santyl 10/12; using Santyl to the 2 wound areas. One on the right lateral ankle and the other in the medial calf which still has exposed muscle. Both areas have come down in size and have better looking surfaces. She has made nice progress with santyl 11/2; we have been using Santyl to the 2 wound areas. Right lateral ankle and the other in the mid tibia area the medial part of this still has exposed muscle. 11/23/2019 on evaluation today patient appears to be  doing about the same really with regard to her wounds. She is actually not very pleased with how things seem to be progressing at this point she tells me that she really has not noted much improvement unfortunately. With that being said there is no signs of active infection at this time. There is some slough buildup noted at this time which again along with some dry skin around the edges of the wound I think would benefit her to try to debride some of this away. Fortunately her pain is  doing fairly well. She still has exposed muscle in the right medial/tibial area. 12/14; TELEHEALTH; she was changed to [facility] to the right calf wound and right lateral ankle wound when she was here last time. Unfortunately since then she had a fall with a pelvic fracture and a fracture of her wrist. She was apparently hospitalized for 5 days. I have not looked at her discharge summary. She apparently came out of the hospital with a blister on her right heel. She was seen today via telehealth by myself and our case manager. The patient and her husband were present. She has been using Hydrofera Blue at our direction from the last time she was in the clinic. There is been no major improvement in fact the areas appear deeper and with a less viable surface Electronic Signature(s) Signed: 12/14/2019 5:56:50 PM By: Linton Ham MD Entered By: Linton Ham on 12/14/2019 09:43:28 -------------------------------------------------------------------------------- Physical Exam Details Patient Name: Date of Service: [name] [name] 12/14/2019 9:00 AM Medical Record Number:[id number] Patient Account Number: [id number] Date of Birth/Sex: Treating RN: [birth year] (68 y.o. [name] Primary Care Provider: [name] Other Clinician: Referring Provider: Treating Provider/Extender:Samiyah Stupka, Leotis Shames, MARY Weeks in Treatment: [phone] Notes Wound exam; the area on the mid tibia medially has exposed muscle  this in itself is not new however there is no covering tissue. Right lateral ankle has some surrounding erythema. Area appears deeper and less viable. Right heel large hemorrhagic looking blister [not eschar]. Electronic Signature(s) Signed: 12/14/2019 5:56:50 PM By: Linton Ham MD Entered By: Linton Ham on 12/14/2019 09:44:32 -------------------------------------------------------------------------------- Physician Orders Details Patient Name: Date of Service: [name] [name] 12/14/2019 9:00 AM Medical Record Number:[id number] Patient Account Number: [id number] Date of Birth/Sex: Treating RN: 01-23-1951 (68 y.o. [name] Primary Care Provider: [name] Other Clinician: Referring Provider: Treating Provider/Extender:Grover Woodfield, Leotis Shames, MARY Weeks in Treatment: [phone] Verbal / Phone Orders: No Diagnosis Coding Follow-up Appointments Return appointment in 3 weeks. - Televisit Dressing Change Frequency Wound #5 Right,Medial Lower Leg Change Dressing every other day. Wound #7 Right,Lateral Malleolus Change Dressing every other day. Skin Barriers/Peri-Wound Care Moisturizing lotion - both legs Wound Cleansing May shower and wash wound with soap and water. Primary Wound Dressing Wound #5 Right,Medial Lower Leg Iodoflex - may use Iodosorb gel if Iodoflex not available Wound #7 Right,Lateral Malleolus Iodoflex - may use Iodosorb gel if Iodoflex not available Secondary Dressing Wound #5 Right,Medial Lower Leg Dry Gauze Heel Cup - to right heel Wound #7 Right,Lateral Malleolus Dry Gauze Heel Cup - to right heel Edema Control Kerlix and Coban - Right Lower Extremity Avoid standing for long periods of time Elevate legs to the level of the heart or above for 30 minutes daily and/or when sitting, a frequency of: - throughout the day Off-Loading Turn and reposition every 2 hours Other: - float heels off of bed/chair with pillow under calves [facility] skilled nursing for wound care. Alvis Lemmings Electronic Signature(s) Signed: 12/14/2019 5:56:50 PM By: Linton Ham MD Signed: 12/14/2019 6:02:21 PM By: Levan Hurst RN, BSN Entered By: Levan Hurst on 12/14/2019 10:39:23 -------------------------------------------------------------------------------- Problem List Details Patient Name: Date of Service: ALVIRA, HECHT 12/14/2019 9:00 AM Medical Record Number:[id number] Patient Account Number: [id number] Date of Birth/Sex: Treating RN: 09/28/1951 (68 y.o. 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 02/24/2015 No Yes of calf and inflammation I87.331 Chronic venous hypertension (idiopathic) with ulcer 10/04/2016 No Yes and inflammation of right lower extremity L89.616 Pressure-induced deep tissue damage of right heel 12/14/2019 No Yes Inactive Problems ICD-10 Code Description Active Date Inactive Date 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 [name] Primary Care Provider: [name] Other Clinician: Referring Provider: Treating Provider/Extender:Marciano Mundt, Leotis Shames, MARY Weeks in Treatment: 403 Subjective History of Present Illness (HPI) this is a patient who initially came to [place] for wounds on the medial malleoli bilaterally as well as her upper medial lower extremities bilaterally. These wounds eventually healed with assistance of Apligraf's bilaterally. While this was occurring she developed the current wound which opened into a fairly substantial wound on the right lower extremity. These are mostly secondary to venous stasis physiology however the patient also has underlying scleroderma, pulmonary hypertension. The wound has been making good progress lately with the Hydrofera Blue-based dressing. 03/17/2015; patient had a arterial evaluation a year ago. Her right ABI was 0.86 left was 1.0. Toe brachial index was 0.41 on the right 0.45 on the left. Her bilateral common femoral artery waveforms were triphasic. Her white popliteal posterior tibial artery and anterior tibial artery waveforms were monophasic with good amplitude. Luteal artery waveforms were biphasic it was felt that her bilateral great toe pressures are of normal although adequate for tissue healing. 03/24/2015. The condition of this wound is not really improved that. He was covered with as fibrinous surface slough and eschar. This underwent an aggressive debridement with both a curette and scalpel. I still cannot really get down to what I can would consider to be a viable surface. There is no evidence of infection. Previous workup for ischemia roughly a year ago was negative nevertheless I think that continues to be a concern 04/07/15. The patient arrived for application of second Apligraf. Once again the surface of this wound is  certainly less viable than I would like for an advanced treatment option. An aggressive debridement was done. She developed some arteriolar bleeding which required that along pressure and silver alginate. 04/21/15: change in the condition of this wound. Once again it is covered in a gelatinous surface slough. After debridement today surface of the wound looks somewhat better but now a heeling surface 05/05/15 Apligraf #4 applied.Still a lot of slough on this wound. 05/19/15 Apligraf #5 applied. Still a lot of slough on this wound I did not aggressively remove this 06/02/15; continued copious amounts of surface slough. This debridement fairly easily. 06/08/2015 -- the last time she had a venous duplex study done was over 3 years ago and the surgery was prior to that. I have recommended that she sees Dr. early for a another opinion regarding a repeat venous duplex and possibly more endovenous ablation of vein stripping of micro-phlebotomies. 06/16/15; wound has a gelatinous surface eschar that  the debridement fairly easily to a point. I don't disagree with the venous workup and perhaps even arterial re-evaluation. She is on prednisone 5 mg and continue his medications for her pulmonary artery hypertension I am not sure if the latter have any wound care healing issues I would need to investigate this. 06/23/15 continues with a gelatinous surface eschar with of fibrinous underlying. What I can see of this wound does not look unhealthy however I just can't get this material which I think is 2 different layers off. Empiric culture done 06/30/15; unfortunately not a lot of change in this wound. A gelatinous surface eschar is easily removed however it has a tight fibrinous surface underneath the. 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 The wound comes back and basically and unchanged state the. She has a gelatinous surface that is more easily removed however there is a tightly fibrinous surface  underneath the. There is no evidence of infection. She has a vascular follow-up next month. I would have to inject her in order to do a more aggressive debridement of this area 07/21/15 the wound is roughly in the same state albeit the debridement is done with greater ease. There is less of the fibrinous eschar underneath the. There is no evidence of infection. She has follow-up with vascular surgery next week. No evidence of surrounding infection. Her original distal wounds healed while this one formed. 07/28/15; wound is easier to debride. No wound erythema. She is seeing Dr. [name] next week. 08/18/15 Has been to [place] for repeat ablation. Have been using medihoney pad with some improvement 08/25/15; absolutely no change in the condition of this wound in either its overall size or surface condition in many months now. At one point I had this down to [place] healthier surface I think with [facility] however this did not actually progress towards closure. Do not believe that the wound has actually deteriorated in terms of volume at all. We have been using a medihoney pad which allows easier debridement of the gelatinous eschar but again no overall actual improvement. the patient is going towards an ablation with pain and vascular which I think is scheduled for next week. The only other investigation that I could first see would be a biopsy. She does have underlying scleroderma 09/02/15 eschar is much easier to debridement however the base of this does not look particularly vibrant. We changed to Iodoflex. The patient had her ablation earlier this week 09/09/15 again the debridement over the base of this wound is easier and the base of the wound looks considerably better. We will continue the Iodoflex. Dr. Tawni Millers has expressed his satisfaction with the result of her ablation 09/15/15 once again the wound is relatively free of surface eschar. No debridement was done today. It has a pale- looking  base to it. although this is not as deep as it once was it seems to be expanding especially inferiorly. She has had recent venous ablation but this is no closer to healing.I've gone ahead and done a punch biopsy this from the inferior part of the wound close to normal skin 09/22/15: the wound is relatively free of surface eschar. There is some surrounding eschar. I'm not exactly sure at what level the surface is that I am seeing. 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 No evidence of atypical infection, malignancy. 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 No debridement was done 09/29/15; the wound surface is  relatively free of surface eschar. There is eschar surrounding the walls of the wound. Aside from the improvement in the amount of surface slough. This wound has not progressed any towards closure. There is not even a surface that looks like there at this is ready. There is no evidence of any infection nor maligancy based on biopsy I did on 9/15. I continued with the Iodoflex however I am looking towards some alternative to try to promote some closure or filling in of this surface. Consider triple layer Oasis. Collagen did not result in adequate control of the surface slough 10/13/15; the patient was in hospital last week with severe anemia. The wound looks somewhat better after debridement although there is widening medially. There is no evidence of infection. 10/20/15; patient's wound on the right lateral lower leg is essentially unchanged. This underwent a light surface debridement and in general the debridement is easier and the surface looks improved. I noted in doing this on the side of the wound what appears to be a piece of calcium deposition. The patient noted that she had previous things on the tips of her fingers. 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 She follows with rheumatology and I have asked them to talk to her about this. In view of that S the nonhealing ulcer may have something to do with calcinosis and also unrelieved Raynaud's disease in this area. I should note that her original wounds on the right and left medial malleolus and the inner aspects of both legs just below the knees did however heal over 10/30/15; the patient's wound on the right lateral lower leg is essentially unchanged. I was able to remove some calcified material from the medial wound edge. 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 Otherwise the wound appears essentially unchanged there is less adherent surface eschar. Some of the calcified material was sent to pathology for analysis 11/17/15. The patient's wound on the right lateral leg is essentially unchanged. Wider Medially. 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 There is some surface slough but I did not debridement this. No evidence of surrounding infection. 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 This will make healing difficult 12/15/15; the wound has less depth surface has a fibrinous slough and calcifications in the wound edge no evidence of infection 12/22/15; the wound definitely has less Fibrinous slough on the base. Calcifications around the wound edges are still evident. Although the wound bed looks healthier it is still pale in appearance. Previous biopsy did not show malignancy 01/04/15; surgical debridement of nonviable slough and subcutaneous tissue the wound cleans up quite nicely but appears to be expanding outward calcifications around the wound  edges are still there. 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 She is to see her rheumatologist I'll see if he has any opinions on this. My punch biopsy done in September did not show calcifications although these are clearly evident. 01/19/16 light selective debridement of nonviable surface slough. There is epithelialization medially. This gives me reason for cautious optimism. She has been to see her rheumatologist, there is nothing that can be done for this  type of soft tissue calcification associated with scleroderma 02/02/16 no debridement although there is a light surface slough. She has 2 peninsulas of skin 1 inferiorly and one medially. We continue to make a slight and slow but definite progressive here 02/16/16; light surface debridement with more attention to the circumference of the wound bed where the fibrinous eschar is more prevalent. No calcifications detected. She seems to have done nicely with the [facility] with some epithelialization and some improvement in the overall wound volume. 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 Continued attention to the circumference of the wound where the fibrinous eschar in calcinosis or prevalent. No calcifications were detected. She has continued improvement with Hydrofera Blue. The wound is no longer as deep 03/15/16 surgical debridement done to remove surface escha Especially around the circumference of the wound where there is nonviable subcutaneous tissue. In spite of this there is considerable improvement in the overall dimensions and depth of the wound. Islands of epithelialization are seen especially medially inferiorly and superiorly to a lesser extent. She is using Hydrofera Blue at home 03/29/16; surgical debridement done to remove surface eschar and nonviable  subcutaneous tissue. This cleans up quite nicely mention slightly larger in terms of length and width however depth is less 04/12/16; continued gradual improvement in terms of depth and the condition of the wound base. Debridement is done. Continuing long standing Hydrofera Blue at home with Kerlix Coban wraps 04/26/16; continued gradual improvement in terms of depth and management as well as condition of the wound base. Surgical debridement done she continues with Hydrofera Blue. 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 She initially came to this clinic venous insufficiency ulcers which have long since healed 05/17/16 continued improvement in terms of the depth and measurements of this wound although she has a tightly adherent fibrinous slough each time. We've been continued with long standing Hydrofera Blue which seems to done as well for this wound is many advanced treatment options. 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 She also has chronic venous insufficiency. She has an irritation on her lateral right ankle secondary to our wraps 05/10/16; wound appears to be smaller especially on the medial aspect and especially in the width. Wound was debridement surface looks better. She is also been in the hospital apparently with anemia again she tells me she had an endoscopy. Since she got home after 3 days which I believe was sometime last week she has had an irritated painful area on the right lateral ankle surrounding the lateral malleolus 05/31/16; much more adherent surface slough today then recently although I don't think the dimensions of changed that much. A more aggressive debridement is required. The irritated area over her medial malleolus is more pruritic and painful and I don't think represents cellulitis 06/14/16; no major change in her wound dimensions however there is more tightly adherent surface slough which  is disappointing. As well as she appears to have a new small area medially. Furthermore an irritated uncomfortable area on the lateral aspect of her right foot just below the lateral malleolus. 06/21/16; I'm seeing the patient back in follow-up for the new areas under her major wound on the right anterior leg. She has been using Hydrofera Blue to this area probably for several months now and although the dressings seem to be helping  for quite a period of time I think things have stagnated lately. She comes in today with a relatively tight adherent surface slough and really no changes in the wound shape or dimension. The 2 small areas she had inferiorly are tiny but still open they seem improved this well. There is no uncontrolled edema and I don't think there is any evidence of cellulitis. 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 Patient has had vascular evaluations both venous and arterial. I have biopsied this area. There is no obvious infection. The worrisome thing today is that she seems to be developing areas of erythema and epithelial damage on the medial aspect of the right foot. Also to a lesser degree inferior to the actual wound itself. Again I see no obvious changes to suggest cellulitis however as this is the only treatable option I will probably give her antibiotics. 07/13/16 no major change in the lady's large anterior right leg wound. Still covered with a very tightly adherent surface slough which is difficult to debridement. There is less erythema around this, culture last week grew pseudomonas I gave her ciprofloxacin. The area on her lateral right malleolus looks better- 08/02/16 the patient's wounds continued to decline. Her original large anterior right leg wound looks deeper. Still adherent surface slough that is difficult to debridement. She has a small area just below  this and a punched-out wound just below her lateral malleolus. In the meantime she is been in hospital with apparently an upper GI bleed on Plavix and aspirin. She is now just on Plavix she received 3 units of packed cells 08/23/16; since I last saw this 3 weeks ago, the open large area on her right leg looks about the same syrup. She has a small satellite lesion just underneath this. The area on her medial right ankle is now a deep necrotic wound. I attempted to debridement this however there is just too much pain. 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 She follows with vascular surgery and is scheduled for an angiogram in early September 09/06/16; the patient is going for an arteriogram tomorrow. Her original large wound on the right calf is about the same the satellite lesion underneath it is about the same however the area on her medial ankle is now deeper with exposed tendon. I am no longer attempting to debride these wounds 09/20/16; the patient has undergone a right femoral endarterectomy and Dacron patch angioplasty. This seems to have helped the flow in her right leg. 10/04/16; Arrived today for aggressive debridement of the wounds on her right calf the original wound the one beneath it and a difficult area over her right lateral leg just above the lateral malleolus. 10/25/16; her 3 open wounds are about the same in terms of dimensions however the surface appears a lot healthier post debridement. Using Iodoflex 10/18/16 we have been using Iodoflex to her wounds which she tolerated with some difficulty. 10/11/16; has been using Santyl for a period of time with some improvement although again very adherent surface slough would prevent any attempted healing this. She has a original wound on the left calf, the satellite underneath that and the most recent wound on the right medial ankle. She has completed  revascularization by Dr. early and has had venous ablation earlier. Want to go back to Iodoflex  to see if week and get a healthier surface to this wound bed failing this I think she'll need to be taken to the OR and I am prepared to call Dr. Marla Roe to discuss this. She is obviously not a good candidate for general anesthesia however.; 11/08/16; I put her on Iodoflex last time to see if I can get the wound bed any healthier and unfortunately today still had tremendous surface slough. 11/15/16; 4 weeks' worth of Iodoflex with not much improvement. Debridement on the major wounds on her left anterior leg is easier however this does not maintain from week to week. The punched-out area on her medial right ankle 11/29/16; I attempted to change the patient last visit to [facility] however she states this burned and was very uncomfortable therefore we gave her permission to go back to the eye out of complex which she already had at home. Also she noted a lot of pain and swelling on the lateral aspect of her leg before she traveled to [facility] for the holidays, I called her in doxycycline over the phone. This seems to have helped 12/06/16; Wounds unchanged by in large. Using Iodoflex 12/13/16; her wounds today actually looks somewhat better. The area on the right lateral lower leg has reasonably healthy-looking granulation and perhaps as actually filled and a bit. Debridement of the 2 wounds on the medial calf is easier and post debridement appears to have a healthier base. We have referred her to Dr. Migdalia Dk for consideration of operative debridement 12/20/16; we have a quick note from Dr. Merri Ray who feels that the patient needs to be referred to an academic center/plastic surgeon. This is due to the complexity of the patient's medical issues as it applies to anything in the OR. We have been using Iodoflex 12/27/16; in general the wounds on the right leg are better in  terms of the difficult to remove surface slough. She has been using Iodoflex. She is approved for Apligraf which I anticipated ordering in the next week or 2 when we get a better-looking surface 01/04/16 the deep wounds on the right leg generally look better. Both of them are debrided further surface slough. The area on the lateral right leg was not debrided. She is approved for Apligraf I think I'll probably order this either next week or the week after depending on the surface of the wounds superiorly. We have been using Iodoflex which will continue until then 01/11/16; the deep wounds on the right leg again have a surface slough that requires debridement. I've not been able to get the wound bed on either one of these wounds down to what would be acceptable for an advanced skin stab- like Apligraf. The area on the medial leg has been improving. We have been using hot Iodoflex to all wounds which seems to do the best at at least limiting the nonviable surface 01/24/17; we have continued Iodoflex and all her wound areas. Her debridement Gen. he is easier and the surface underneath this looks viable. Nevertheless these are large area wounds with exposed muscle at least on the anterior parts. We have ordered Apligraf's for 2 weeks from now. The patient will be away next week 02/07/17; the patient was close to have first Apligraf today however we did not order one. I therefore replaced her Iodoflex. She essentially has 3 large punched-out areas on her right anterior leg and right medial ankle. 02/11/17; Apligraf #1 02/25/17; Apligraf #2. In general some improvement in the right medial  ankle and right anterior leg wounds. The larger wound medially perhaps some better 03/11/17; Apligraf #3. In general continued improvement in the right medial ankle and the right anterior leg wounds. 03/25/17 Apligraf #4. In general continued improvement especially on the right medial ankle and the lower 04/08/17; Apligraf #5 in  general continued improvement in all wound sites. 04/22/17; post Apligraf #5 her wound beds continued to look a lot better all of them up to the surface of the surrounding skin. Had a caramel-colored slough that I did not debrided in case there is residual Apligraf effect. The wounds are as good as I have seen these looking quite some time. 04/29/17; we applied [place] and last week after completing her Apligraf. Wounds look as though they've contracted somewhat although they have a nonviable surface which was problematic in the past. Apparently she has been approved for further Apligraf's. We applied Iodosorb today after debridement. 05/06/17; we're fortunate enough today to be able to apply additional Apligraf approved by her insurance. In general all of her wounds look better Apligraf #6 05/24/17; Apligraf #7 continued improvement in all wounds o3 06/10/17 Apligraf #8. Continued improvement in the surface of all wounds. Not much of an improvement in dimensions as I might a follow 06/24/17 Apligraf #9 continued general improvement although not as much change in the wound areas I might of like. She has a new open area on the right anterior lateral ankle very small and superficial. She also has a necrotic wound on the tip of her right index finger probably secondary to severe uncontrolled Raynaud's phenomenon. She is already on sildenafil and already seen her rheumatologist who gave her Keflex. 07/08/17; Apligraf #10. Generalized improvement although she has a small additional wound just medial to the major wound area. 07/22/17; after discussion we decided not to reorder any further Apligraf's although there is been considerable improvement with these it hasn't been recently. The major wound anteriorly looks better. Smaller wounds beneath this and the more recent one and laterally look about the same. The area on the right lateral lower leg looks about the same 07/29/17 this is a patient who is  exceedingly complex. She has advanced scleroderma, crest syndrome including calcinosis, PAD status post revascularization, chronic venous insufficiency status post ablations. She initially presented to this clinic with wounds on her bilateral lower legs however these closed. More recently we have been dealing with a large open area superiorly on the right anterior leg, a smaller wound underneath this and then another one on the right medial lower extremity. These improve significantly with 10 Apligraf applications. Over the last 2-3 weeks we are making good progress with Hydrofera Blue and these seem to be making progress towards closure 08/19/17; wounds continued to make good improvement with Hydrofera Blue and episodic aggressive debridements 08/26/17; still using Hydrofera Blue. Good improvements 09/09/17; using Hydrofera Blue continued improvement. Area on the lateral part of her right leg has only a small remaining open area. The small inferior satellite region is for all intents and purposes closed. Her major wound also is come in in terms of depth and has advancing epithelialization. 09/16/17; using Hydrofera Blue with continued improvement. The smaller satellite wound. We've closed out today along with a new were satellite wound medially. The area on her medial ankle is still open and her major wound is still open but making improvement. All using Hydrofera Blue. Currently 09/30/17; using Hydrofera Blue. Still a small open area on the lateral right ankle area and her  original major wound seems to be making gradual and steady improvement. 10/14/17; still using Hydrofera Blue. Still too small open areas on the right lateral ankle. Her original major wound is horizontal and linear. The most problematic area paradoxically seems to be the area to the medial area wears I thought it would be the lateral. 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 10/28/17; still using Hydrofera Blue. Right lateral ankle has a very small open area superiorly on the most lateral part of the wound. Her original open wound has 2 open areas now separated by normal skin and we've redefined this. 11/11/17; still using Hydrofera Blue area and right lateral ankle continues to have a small open area on mostly the lateral part of the wound. Her original wound has 2 small open areas now separated by a considerable amount of normal skin 11/28/17; the patient called in slightly before Thanksgiving to report pain and erythema above the wound on the right leg. In the past this is responded well to treatment for cellulitis and I gave her over the phone doxycycline. She stated this resulted in fairly abrupt improvement. We have been using Hydrofera Blue for a prolonged period of time to the larger wound anteriorly into the remaining wound on her right right lateral ankle. The latter is just about closed with only a small linear area and the bottom of the [place]. 12/02/17; use endoform and left the dressing on since last visit. There is no tenderness and no evidence of infection. 12/16/17; patient has been using Endoform but not making much progress. The 2 punched out open areas anteriorly which were the reminiscence of her major wound appear deeper. The area on the lateral aspect of her right calf also appears deeper. Also she has a puzzling tender swelling above her wound on the right leg. This seems larger than last time. Just above her wounds there appears to be some fluctuance in this area it is not erythematous and there is no crepitus 12/30/17; patient has been using Endoform up until last week we used Hydrofera Blue. Ultrasound of the swelling above her 2 major wounds last time was negative for a fluid collection. I gave her cefaclor for the erythema and tenderness in this area which seems better. Unfortunately both punched out areas anteriorly and  the area on her right medial lower leg appear deeper. In fact the lateral of the wounds anteriorly actually looks as though it has exposed tendon and/or muscle sheath. She is not systemically unwell. She is complaining of vaginitis type symptoms presumably Candida from her antibiotics. 01/06/18; we're using santyl. she has 2 punched out areas anteriorly which were initially part of a large wound. Unfortunately medially this is now open to tendon/muscle. All the wounds have the same adherent very difficult to debride surface. 01/20/18; 2 week follow-up using Santyl. She has the 2 punched out areas anteriorly which were initially part of her large surface wound there. Medially this still has exposed muscle. All of these have the same tightly adherent necrotic surface which requires debridement. PuraPly was not accepted by the patient's insurance however her insurance I think it changed therefore we are going to run Apligraf to gain 02/03/18; the patient has been using Santyl. The wound on the right lateral ankle looks improved and the 2 areas anteriorly on the right leg looks about the same. The medial one has exposed muscle. The lateral 1 requiresdebridement. We use PuraPly today for the  first week 02/10/18; PuraPly #2. The patient has 3 wounds. The area on the right lateral ankle, 2 areas anteriorly that were part of her original large wound in this area the medial one has exposed muscle. All of the wounds were lightly debrided with a number 3 curet. PuraPly #2 applied the lateral wound on the calf and the right lateral ankle look better. 02/17/18; PuraPly #3. Patient has 3 wounds. The area on the right lateral ankle in 2 areas internally that were part of her original large wound. The lateral area has exposed muscle. She arrived with some complaints of pain around the right ankle. 02/24/18; PuraPly #4; not much change in any of the 3 wound areas. Right lateral ankle, right lateral calf. Both of these  required debridement with a #3 curet. She tolerates this marginally. The area on the medial leg still has exposed muscle. Not much change in dimensions 03/03/18 PuraPly #5. The area on the medial ankle actually looks better however the 2 separate areas that were original parts of the larger right anterior leg wound look as though they're attempting to coalesce. 03/10/18; PuraPly #6. The area on the medial ankle actually continues to look and measures smaller however the 2 separate areas that were part of the original large wound on the right anterior leg have now coalesced. There hasn't been much improvement here. The lateral area actually has underlying exposed muscle 03/17/18-she is here in follow-up evaluation for ulcerations to the right lower extremity. She is voicing no complaints or concerns. She tolerated debridement. Puraply#7 placed 03/24/18; difficult right lower extremity ulcerations. PuraPly #8 place. She is been approved for [employer]. She did very well with Apligraf today however she is apparently reached her "lifetime max" 03/31/18; marginal improvement with PuraPly although her wounds looked as good as they have in several weeks today. Used TheraSkins #1 04/14/18 TheraSkin #2 today 04/28/18 TheraSkins #3. Wound slightly improved 05/12/18; TheraSkin skin #4. Wound response has been variable. 05/27/18 TheraSkin #5. Generally improvement in all wound areas. I've also put her in 3 layer compression to help with the severe venous hypertension 06/09/18; patient is done quite well with the TheraSkins unfortunately we have no further applications. I also put her in 3 layer compression last week and that really seems to of helped. 06/16/18; we have been using silver collagen. Wounds are smaller. Still be open area to the muscle layer of her calf however even that is contracted somewhat. She tells me that at night sometimes she has pain on the right lateral calf at the site of her lower wound.  Notable that I put her into 3 layer compression about 3 weeks ago. She states that she dangles her leg over the bed that makes it feel better but she does not describe claudication during the day ooShe is going to call her secondary insurance to see if they will continue to cover advanced treatment products I have reviewed her arterial studies from 01/22/17; this showed an ABI in the right of 1 and on the left noncompressible. TBI on the right at 0.30 on the left at 0.34. It is therefore possible she has significant PAD with medial calcification falsely elevating her ABI into the normal range. I'll need to be careful about asking her about this next week it's possible the 3 layer compression is too much 06/23/18; was able to reapply TheraSkin 1 today. Edema control is good and she is not complaining of pain no claudication 07/07/18;no major change. New wound which was  apparently a taper removal injury today in our clinic between her 2 wounds on the right calf TheraSkins #2 07/14/18; I think there is some improvement in the right lateral ankle and the medial part of her wound. There is still exposed muscle medially. 07/28/18; two-week follow-up. TheraSkins#3. Unfortunately no major change. 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 She seems to be doing well and in the base of the wound appears to show some progress at this point. This is the last approved Theraskin of the second set. 08/25/18; she has completed TheraSkin. There has been some improvement on the right lateral calf wound as well as the anterior leg wounds. The open area to muscle medially on the anterior leg wound is smaller. I'm going to transition her back to [facility] under Kerlix Coban change every second day. She reports that she had  some calcification removed from her right upper arm. We have had previous problems with calcifications in her wounds on her legs but that has not happened recently 09/08/18;using Hydrofera Blue on both her wound areas. Wounds seem to of contracted nicely. She uses Kerlix Coban wrap and changes at home herself 09/22/2018; using Hydrofera Blue on both her wound areas. Dimensions seem to have come down somewhat. There is certainly less depth in the medial part of the mid tibia wound and I do not think there is any exposed muscle at this point. 10/06/2018; 2-week follow-up. Using [facility] on her wound areas. Dimensions have come down nicely both on the right lateral ankle area in the right mid tibial area. She has no new complaints 10/20/2018; 2-week follow-up. She is using Hydrofera Blue. Not much change from the last time she was here. The area on the lateral ankle has less depth although it has raised edges on one side. I attempted to remove as much of the raised edge as I could without creating more additional wounds. The area on the right anterior mid tibia area looks the same. 11/03/2018; 2-week follow-up she is using Hydrofera Blue. On the right anterior leg she now has 2 wounds separated by a large area of normal skin. The area on the medial part still has I think exposed muscle although this area itself is a lot smaller. The area laterally has some depth. Both areas with necrotic debris. ooThe area on her right lateral ankle has come in nicely 11/17/2018; patient continues to use Hydrofera Blue. We have been increasing separation of the 2 wounds anteriorly which were at one point joint the area on the right lateral calf continues to have I think some improvement in depth. 12/08/2018; patient continues to use Hydrofera Blue. There is some improvement in the area on the right lateral calf. The 2 areas that were initially part of the original large wound in the mid right tibia are probably  about the same. In fact the medial area is probably somewhat larger. We will run puraply through the patient's insurance 12/22/2018; she has been using Hydrofera Blue. We have small wounds on the right lateral calf and 2 small areas that were initially part of a large wound in the right mid tibia. We applied pure apply #1 today. 12/29/2018; we applied puraply #2. Her wounds look somewhat better especially on the right lateral calf and the lateral part of her original wound  in the mid tibial area. 01/05/2019; perhaps slightly improved in terms of wound bed condition but certainly not as much improvement as I might of liked. Puraply #3 1/13: we did not have a correct sized puraply to apply. wounds more pinched out looking, I increased her compression to 3 layer last week to help with significant multilevel venous hypertension. Since then I've reviewed her arterial status. She has a right femoral endarterectomy and a distal left SFA stent. She was being followed by Dr. [name] for a period however she does not appear to have seen him in 3 years. I will set up an appointment. 1/20. The patient has an additional wound on the right lateral calf between the distal wound and proximal wounds. We did not have Puraply last week. Still does not have a follow-up with Dr. early 1/27: Follow-up with Dr. early has been arranged apparently with follow-up noninvasive studies. Wounds are measuring roughly the same although they certainly look smaller 2/3; the patient had non invasive studies. Her ABIs on the right were 0.83 and on the left 1.02 however there was no great toe pressure bilaterally. Also worrisome monophasic waveforms at the PTA and dorsalis pedis. We are still using Puraply. We have had some improvement in all of the wounds especially the lateral part of the mid tibial area. 2/10; sees Dr. early of vein and vascular re-arterial studies next week. Puraply reapplied today. 2/17; Dr. early of vein and  vascular his appointment is tomorrow. Puraply reapplied after debridement of all wounds 2/24; the patient saw Dr. early I reviewed his note. sHe noted the previous right femoral endarterectomy with a Dacron patch. He also noted the normal ABI and the monophasic waveforms suggesting tibial disease. Overall he did not feel that she had any evidence of arterial insufficiency that would impair her wound healing. He did note her venous disease as well. He suggested PRN follow-up. 3/2; I had the last puraply applied today. The original wounds over the mid tibia area are improved where is the area on the right lower leg is not 3/9; wounds are smaller especially in the right mid tibia perhaps slightly in the right lateral calf. We finished with puraply and went to endoform today 3/23; the patient arrives after 2 weeks. She has been using endoform. I think all of her wounds look slightly better which includes the area on the right lateral calf just above the right lateral malleolus and the 2 in the right mid tibia which were initially part of the same wound. 4/27; TELEHEALTH visit; the patient was seen for telehealth visit today with her consent in the middle of the worldwide epidemic. Since she was last here she called in for antibiotics with pain and tenderness around the area on the right medial ankle. I gave her empiric doxycycline. She states this feels better. She is using endoform on both of these areas 5/11 TELEHEALTH; the patient was seen for telehealth visit today. She was accompanied at home by her husband. She has severe pulmonary hypertension accompanied scleroderma and in the face of the Covid epidemic cannot be safely brought into our clinic. We have been using endoform on her wound areas. There are essentially 3 wound areas now in the left mid tibia now 2 open areas that it one point were connected and one on the right lateral ankle just above the malleolus. 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 She cannot come to clinic. The wounds were reviewed today via telehealth. She has severe chronic venous hypertension which I think is centrally mediated. She has wounds on her right anterior tibia and right lateral ankle area. These are chronic. She has been using endoform. 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 We have been following her from telehealth. She has severe chronic venous hypertension which may be mostly centrally mediated secondary to right heart heart failure. She has wounds on her right anterior tibia and right lateral ankle these are chronic we have been using endoform 6/22; TELEHEALTH; this patient was seen today via telehealth. 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 Since the last time we had contact with this patient she developed some pain and erythema around the wound on her right lateral malleolus/ankle and we put in antibiotics for her. This is resulted in good improvement with resolution of the erythema and tenderness. I changed her to silver alginate last time. We had been using endoform for an extended period of time 7/13; TELEHEALTH; this patient was seen today via telehealth. 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 She would continue to be at a prohibitive risk to be brought into our clinic unless this was absolutely necessary. These visits have been done with her approval as well as her husband. We have been using silver alginate to the areas on the right mid tibia and right lateral lower leg. 7/27 TELEHEALTH;  patient was seen along with her husband today via telehealth. 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 We changed her to sample last visit. She has 2 areas a chronic wound on her right mid tibia and one just above her ankle. These were not the original wounds when she came into this clinic but she developed them during treatment 8/17; she comes in for her first face-to-face visit in a long period. She has a remaining area just medial to the right tibia which is the last open part of her large wound across this area. She also has an area on the right lateral lower leg. We prescribed Santyl last telehealth visit but they were concerned that this was making a deeper so they put silver alginate on it last week. Her husband changes the dressings. 8/31; using Santyl to the 2 wound areas some improvement in wound surfaces. Husband has surgery in 2 weeks we will put her out 3 weeks. Any of the advanced treatment options that I can think of that would be eligible for this wound would also cause her to have to come in weekly. The risk that the patient is just too high 9/21. Using Santyl to the 2 wound areas. Both of these are somewhat better although the medial mid tibia area still has exposed muscle. Lateral ankle requiring debridement. Using Santyl 10/12; using Santyl to the 2 wound areas. One on the right lateral ankle and the other in the medial calf which still has exposed muscle. Both areas have come down in size and have better looking surfaces. She has made nice progress with santyl 11/2; we have been using Santyl to the  2 wound areas. Right lateral ankle and the other in the mid tibia area the medial part of this still has exposed muscle. 11/23/2019 on evaluation today patient appears to be doing about the same really with regard to her wounds. She is actually not very pleased with how things seem to be  progressing at this point she tells me that she really has not noted much improvement unfortunately. With that being said there is no signs of active infection at this time. There is some slough buildup noted at this time which again along with some dry skin around the edges of the wound I think would benefit her to try to debride some of this away. Fortunately her pain is doing fairly well. She still has exposed muscle in the right medial/tibial area. 12/14; TELEHEALTH; she was changed to [facility] to the right calf wound and right lateral ankle wound when she was here last time. Unfortunately since then she had a fall with a pelvic fracture and a fracture of her wrist. She was apparently hospitalized for 5 days. I have not looked at her discharge summary. She apparently came out of the hospital with a blister on her right heel. She was seen today via telehealth by myself and our case manager. The patient and her husband were present. She has been using Hydrofera Blue at our direction from the last time she was in the clinic. There is been no major improvement in fact the areas appear deeper and with a less viable surface Patient History Information obtained from Patient. Allergies Sulfa (Sulfonamide Antibiotics) (Severity: Severe, Reaction: rash), Levaquin (Severity: Severe, Reaction: flu like symptoms), penicillin (Severity: Severe, Reaction: rash) Social History Former smoker, Marital Status - Married, Alcohol Use - Moderate, Drug Use - No History. Medical History Ear/Nose/Mouth/Throat Denies history of Chronic sinus problems/congestion, Middle ear problems Hematologic/Lymphatic Patient has history of Anemia Denies history of Hemophilia, Human Immunodeficiency Virus, Lymphedema, Sickle Cell Disease Respiratory Denies history of Aspiration, Asthma, Chronic Obstructive Pulmonary Disease (COPD), Pneumothorax, Sleep Apnea, Tuberculosis Cardiovascular Patient has history of  Hypertension, Peripheral Arterial Disease, Peripheral Venous Disease Denies history of Angina, Arrhythmia, Congestive Heart Failure, Coronary Artery Disease, Deep Vein Thrombosis, Hypotension, Myocardial Infarction, Phlebitis, Vasculitis Gastrointestinal Denies history of Cirrhosis , Colitis, Crohnoos, Hepatitis A, Hepatitis B, Hepatitis C Endocrine Denies history of Type I Diabetes, Type II Diabetes Genitourinary Denies history of End Stage Renal Disease Immunological Patient has history of Raynaudoos, Scleroderma Denies history of Lupus Erythematosus Integumentary (Skin) Denies history of History of Burn Musculoskeletal Patient has history of Rheumatoid Arthritis, Osteoarthritis Denies history of Gout, Osteomyelitis Neurologic Denies history of Dementia, Neuropathy, Quadriplegia, Paraplegia, Seizure Disorder Oncologic Denies history of Received Chemotherapy, Received Radiation Psychiatric Denies history of Anorexia/bulimia, Confinement Anxiety Hospitalization/Surgery History - cardiac cath. - right 2nd finger ray amp and tendon lengthening. - Fall w/wrist and pelvic fx 11/2019. Medical And Surgical History Notes Respiratory O2 dependent, pulmonary hypertension Objective Integumentary (Hair, Skin) Wound #5 status is Open. Original cause of wound was Gradually Appeared. The wound is located on the Right,Medial Lower Leg. The wound measures 1.2cm length x 0.9cm width x 0.9cm depth; 0.848cm^2 area and 0.763cm^3 volume. There is no tunneling or undermining noted. There is a medium amount of purulent drainage noted. The wound margin is well defined and not attached to the wound base. There is no granulation within the wound bed. There is a large (67-100%) amount of necrotic tissue within the wound bed including Adherent Slough. Wound #7 status is Open.  Original cause of wound was Gradually Appeared. The wound is located on the Right,Lateral Malleolus. The wound measures 1.2cm length  x 0.6cm width x 0.3cm depth; 0.565cm^2 area and 0.17cm^3 volume. There is no tunneling or undermining noted. There is a medium amount of purulent drainage noted. The wound margin is well defined and not attached to the wound base. There is no granulation within the wound bed. There is a large (67-100%) amount of necrotic tissue within the wound bed including Adherent Slough. 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 - Televisit Dressing Change Frequency: Wound #5 Right,Medial Lower Leg: Change Dressing every other day. Wound #7 Right,Lateral Malleolus: Change Dressing every other day. Skin Barriers/Peri-Wound Care: Moisturizing lotion - both legs Wound Cleansing: May shower and wash wound with soap and water. Primary Wound Dressing: Wound #5 Right,Medial Lower Leg: Iodoflex - may use Iodosorb gel if Iodoflex not available Wound #7 Right,Lateral Malleolus: Iodoflex - may use Iodosorb gel if Iodoflex not available Secondary Dressing: Wound #5 Right,Medial Lower Leg: Dry Gauze Heel Cup - to right heel Wound #7 Right,Lateral Malleolus: Dry Gauze Heel Cup - to right heel Edema Control: Kerlix and Coban - Right Lower Extremity Avoid standing for long periods of time Elevate legs to the level of the heart or above for 30 minutes daily and/or when sitting, a frequency of: - throughout the day Off-Loading: Turn and reposition every 2 hours Other: - float heels off of bed/chair with pillow under calves Home Health: [facility] for  wound care. - [facility] 1. I change the primary dressings to Iodoflex 2. Pad the right heel. This is likely a deep tissue injury from when she was in the hospital. Advised to strictly offload this area 3. I will see her back in the new year possibly by telehealth visit possibly she might need to come into clinic Electronic Signature(s) Signed: 12/14/2019 5:56:50 PM By: Linton Ham MD Signed: 12/14/2019 6:02:21 PM By: Levan Hurst RN, BSN Entered By: Levan Hurst on 12/14/2019 11:11:18 -------------------------------------------------------------------------------- HxROS Details Patient Name: Date of Service: [name] [name]. 12/14/2019 9:00 AM Medical Record Number:[id number] Patient Account Number: [id number] Date of Birth/Sex: Treating RN: [birth year] (68 y.o. [name] Primary Care Provider: [name] Other Clinician: Referring Provider: Treating Provider/Extender:Endrit Gittins, Leotis Shames, MARY Weeks in Treatment: [phone] Information Obtained From Patient Ear/Nose/Mouth/Throat Medical History: Negative for: Chronic sinus problems/congestion; Middle ear problems Hematologic/Lymphatic Medical History: Positive for: Anemia Negative for: Hemophilia; Human Immunodeficiency Virus; Lymphedema; Sickle Cell Disease Respiratory Medical History: Negative for: Aspiration; Asthma; Chronic Obstructive Pulmonary Disease (COPD); Pneumothorax; Sleep Apnea; Tuberculosis Past Medical History Notes: O2 dependent, pulmonary hypertension Cardiovascular Medical History: Positive for: Hypertension; Peripheral Arterial Disease; Peripheral Venous Disease Negative for: Angina; Arrhythmia; Congestive Heart Failure; Coronary Artery Disease; Deep Vein Thrombosis; Hypotension; Myocardial Infarction; Phlebitis; Vasculitis Gastrointestinal Medical History: Negative for: Cirrhosis ; Colitis; Crohns; Hepatitis A; Hepatitis B; Hepatitis C Endocrine Medical History: Negative for: Type I Diabetes;  Type II Diabetes Genitourinary Medical History: Negative for: End Stage Renal Disease Immunological Medical History: Positive for: Raynauds; Scleroderma Negative for: Lupus Erythematosus Integumentary (Skin) Medical History: Negative for: History of Burn Musculoskeletal  Medical History: Positive for: Rheumatoid Arthritis; Osteoarthritis Negative for: Gout; Osteomyelitis Neurologic Medical History: Negative for: Dementia; Neuropathy; Quadriplegia; Paraplegia; Seizure Disorder Oncologic Medical History: Negative for: Received Chemotherapy; Received Radiation Psychiatric Medical History: Negative for: Anorexia/bulimia; Confinement Anxiety Immunizations Pneumococcal Vaccine: Received Pneumococcal Vaccination: Yes Implantable Devices No devices added Hospitalization / Surgery History Type of Hospitalization/Surgery cardiac cath right 2nd finger ray amp and tendon lengthening Fall w/wrist and pelvic fx 11/2019 Family and Social History Former smoker; Marital Status - Married; Alcohol Use: Moderate; Drug Use: No History; Financial Concerns: No; Food, Clothing or Shelter Needs: No; Support System Lacking: No; Transportation Concerns: No Engineer, maintenance) Signed: 12/14/2019 5:56:50 PM By: Linton Ham MD Signed: 12/14/2019 6:02:21 PM By: Levan Hurst RN, BSN Entered By: Levan Hurst on 12/14/2019 11:10:52 -------------------------------------------------------------------------------- [place] Details Patient Name: Date of Service: [name] [name] 12/14/2019 Medical Record Number:[id number] Patient Account Number: [id number] Date of Birth/Sex: Treating RN: August 07, 1951 (68 y.o. 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

## 2019-12-14 NOTE — Progress Notes (Signed)
KYANN, HEYDT ([id number]) Visit Report for 12/14/2019 Allergy List Details Patient Name: Date of Service: [name] [name], [name] [name] 12/14/2019 9:00 AM Medical Record Number:[id number] Patient Account Number: [id number] Date of Birth/Sex: Treating RN: 1951/02/23 (67 y.o. [name] Primary Care Towanna Avery: [name] Other Clinician: Referring Giovany Cosby: Treating Afua Hoots/Extender:Robson, Leotis Shames, MARY Weeks in Treatment: [phone] Allergies Active Allergies Sulfa (Sulfonamide Antibiotics) Reaction: rash Severity: Severe Levaquin Reaction: flu like symptoms Severity: Severe penicillin Reaction: rash Severity: Severe Allergy Notes Electronic Signature(s) Signed: 12/14/2019 5:56:50 PM By: Linton Ham MD Entered By: Linton Ham on 12/14/2019 09:39:00 -------------------------------------------------------------------------------- Wound Assessment Details Patient Name: Date of Service: [name] [name]. 12/14/2019 9:00 AM Medical Record Number:[id number] Patient Account Number: [id number] Date of Birth/Sex: Treating RN: 1951/02/05 (68 y.o. [name] Primary Care Artice Bergerson: [name] Other Clinician: Referring Jediah Horger: Treating Velia Pamer/Extender:Robson, Leotis Shames, MARY Weeks in Treatment: 403 Wound Status Wound Number: 5 Primary Venous Leg Ulcer Etiology: Wound Location: Right Lower Leg - Medial Wound Open Wounding Event: Gradually Appeared Wounding Event: Gradually Appeared Status: Date Acquired: 01/13/2013 Comorbid Anemia, Hypertension, Peripheral Arterial Weeks Of Treatment: 359 History: Disease, Peripheral Venous Disease, Clustered Wound: Yes Raynauds, Scleroderma, Rheumatoid Arthritis, Osteoarthritis Wound Measurements Length: (cm) 1.2 % Reduction Width: (cm) 0.9 % Reduction Depth: (cm) 0.9 Epitheliali Area: (cm) 0.848 Tunneling: Volume: (cm) 0.763 Underminin Wound Description Full Thickness With Exposed Support Foul  Odor Classification: Structures Slough/Fib Wound Well defined, not attached Margin: Exudate Medium Amount: Exudate Type: Purulent Exudate yellow, brown, green Color: Wound Bed Granulation Amount: None Present (0%) Necrotic Amount: Large (67-100%) Fascia Exp Necrotic Quality: Adherent Slough Fat Layer Tendon Exp Muscle Exp Joint Expo Bone Expos After Cleansing: No rino Yes Exposed Structure osed: No (Subcutaneous Tissue) Exposed: No osed: No osed: No sed: No ed: No in Area: 40% in Volume: -441.1% zation: Small (1-33%) No g: No Electronic Signature(s) Signed: 12/14/2019 6:02:21 PM By: Levan Hurst RN, BSN Entered By: Levan Hurst on 12/14/2019 10:38:28 -------------------------------------------------------------------------------- Wound Assessment Details Patient Name: Date of Service: [name] [name] 12/14/2019 9:00 AM Medical Record Number:[id number] Patient Account Number: [id number] Date of Birth/Sex: Treating RN: 06-30-1951 (68 y.o. [name] Primary Care Amel Kitch: [name] Other Clinician: Referring Denise Washburn: Treating Greenley Martone/Extender:Robson, Leotis Shames, MARY Weeks in Treatment: 403 Wound Status Wound Number: 7 Primary Venous Leg Ulcer Etiology: Wound Location: Right Malleolus - Lateral Wound Open Wounding Event: Gradually Appeared Status: Date Acquired: 06/25/2016 ComorbidAnemia, Hypertension, Peripheral Arterial Weeks Of Treatment: 179 Weeks Of Treatment: 179 History: Disease, Peripheral Venous Disease, Clustered Wound: No Raynauds, Scleroderma, Rheumatoid Arthritis, Osteoarthritis Wound Measurements Length: (cm) 1.2 % Reduct Width: (cm) 0.6 % Reduct Depth: (cm) 0.3 Epitheli Area: (cm) 0.565 Tunneli Volume: (cm) 0.17 Undermi Wound Description Full Thickness Without Exposed Support Foul Odo Classification: Structures Slough/F Wound Well defined, not  attached Margin: Exudate Medium Amount: Exudate Purulent Type: Exudate yellow, brown, green Color: Wound Bed Granulation Amount: None Present (0%) Necrotic Amount: Large (67-100%) Fascia E Necrotic Quality: Adherent Slough Fat Laye Tendon E Muscle E Joint Ex Bone Exp r After Cleansing: No ibrino Yes Exposed Structure xposed: No r (Subcutaneous Tissue) Exposed: No xposed: No xposed: No posed: No osed: No ion in Area: 77.9% ion in Volume: 33.3% alization: None ng: No ning: No Electronic Signature(s) Signed: 12/14/2019 6:02:21 PM By: Levan Hurst RN, BSN Entered By: Levan Hurst on 12/14/2019 10:38:51

## 2019-12-15 NOTE — Telephone Encounter (Signed)
FAXED

## 2019-12-15 NOTE — Telephone Encounter (Signed)
[name] [name] called to get Lab orders faxed to [phone], so that she may draw them next week ay her visit.

## 2019-12-17 ENCOUNTER — Other Ambulatory Visit: Payer: Medicare Other | Admitting: Internal Medicine

## 2019-12-17 ENCOUNTER — Other Ambulatory Visit (HOSPITAL_COMMUNITY): Payer: Self-pay | Admitting: Internal Medicine

## 2019-12-21 ENCOUNTER — Ambulatory Visit: Payer: Medicare Other | Admitting: Internal Medicine

## 2019-12-21 ENCOUNTER — Telehealth: Payer: Self-pay

## 2019-12-21 ENCOUNTER — Ambulatory Visit (INDEPENDENT_AMBULATORY_CARE_PROVIDER_SITE_OTHER): Payer: Medicare Other | Admitting: Internal Medicine

## 2019-12-21 ENCOUNTER — Encounter: Payer: Self-pay | Admitting: Internal Medicine

## 2019-12-21 VITALS — Temp 98.3°F

## 2019-12-21 DIAGNOSIS — I779 Disorder of arteries and arterioles, unspecified: Secondary | ICD-10-CM | POA: Diagnosis not present

## 2019-12-21 DIAGNOSIS — J069 Acute upper respiratory infection, unspecified: Secondary | ICD-10-CM

## 2019-12-21 MED ORDER — AZITHROMYCIN 250 MG PO TABS: ORAL_TABLET | ORAL | 0 refills | Status: DC

## 2019-12-21 NOTE — Progress Notes (Signed)
   Subjective:    Patient ID: [name] [name] [name], female    DOB: 07-13-1951, 68 y.o.   MRN: [id number]  HPI 68 year old Female with  pulmonary hypertension and chronic respiratory failure requiring home oxygen with history of scleroderma and hypothyroidism hospitalized November 23 after a fall at home.  She suffered distal radial fracture left elbow requiring open reduction internal fixation on November 24.  Also suffered fractures of pelvis involving left superior and inferior pubic rami.  She is seen today by [employer] due to Coronavirus pandemic.  She is seen with her husband today.  She is agreeable to visit in this format today. She has come down with a respiratory infection.  Has nasal congestion.  Realizes her anterior cervical glands are slightly swollen and tender.  Denies fever or chills.  No myalgias.  No known COVID-19 exposure.  Husband has no COVID-19 symptoms.  She is identified using 2 identifiers as [name] [name] [name]. [name] [name], a patient in this practice.    Review of Systems see above no nausea vomiting dysgeusia diarrhea     Objective:   Physical Exam Seen virtually sitting in chair in no acute distress       Assessment & Plan:  Acute respiratory infection  Plan: Given her multiple medical issues pulmonary hypertension and chronic oxygen therapy with scleroderma, it was imperative to cover her with antibiotic at this point time.  She usually responds well to Zithromax Z-PAK so we will going to start Zithromax 2 p.o. day 1 followed by 1 p.o. days 2 through 5.  She will rest and drink plenty of fluids and stay at home.  Call if symptoms worsen.  Time spent interviewing patient, reviewing chart, prescribing medication and medical decision making is 15 minutes.

## 2019-12-21 NOTE — Telephone Encounter (Signed)
Set up virtual visit

## 2019-12-21 NOTE — Telephone Encounter (Signed)
Patient called states she is congested and has a runny nose and one of her glands is swollen she would like to know what to do?

## 2019-12-21 NOTE — Patient Instructions (Signed)
Take Zithromax Z pak 2 po day 1 followed by one po days 2-5. Rest and drink fluids.

## 2019-12-23 ENCOUNTER — Encounter: Payer: Self-pay | Admitting: Internal Medicine

## 2019-12-23 ENCOUNTER — Telehealth (INDEPENDENT_AMBULATORY_CARE_PROVIDER_SITE_OTHER): Payer: Medicare Other | Admitting: Internal Medicine

## 2019-12-23 DIAGNOSIS — R790 Abnormal level of blood mineral: Secondary | ICD-10-CM

## 2019-12-23 NOTE — Telephone Encounter (Signed)
Called patient about magnesium supplement. She says she is taking one tab 150 mg daily. Mg level is slightly low at 1.3 normal is 1.6-2.3.  Asked her to increase dose to one night and morning. Home health nurse can draw magnesium level again in 2-3 weeks.  She and husband are staying in. Neighbor has Covid-19 and granddaughter not coming for Christmas as she was exposed to [place].  Patient had cast reoved from arm recently. Still having pelvic pain s/p fracture. Advised this will likely go on 2-3 months.

## 2019-12-27 ENCOUNTER — Encounter: Payer: Self-pay | Admitting: Internal Medicine

## 2019-12-27 NOTE — Patient Instructions (Signed)
Continue home PT and home health care services.  Will need follow-up magnesium and CBC in mid December.  Take magnesium supplement at home.

## 2019-12-27 NOTE — Progress Notes (Signed)
   Subjective:    [name], female    DOB: [birth year], 68 y.o.   MRN: [id number]  HPI 68 year old Female was acutely hospitalized November 23 after a fall at home.  Now being seen for hospitalization follow-up of multiple medical issues.  History of low magnesium.  Currently not taking magnesium supplement. She will try to get something she can tolerate at [facility].  When she was admitted, magnesium level was 0.8 but after receiving IV supplementation, magnesium level improved to 1.7 which is at the low end of normal.  I am afraid it will decrease again if she is not supplemented at home.  She suffered distal radial fracture (left elbow) requiring open reduction and internal fixation on November 24.  Also suffered fractures of pelvis involving left superior and inferior pubic rami.  She has chronic pulmonary hypertension and chronic respiratory failure requiring home oxygen.  History of hypothyroidism.  History of scleroderma.  Previously hospitalized November 10 with persistent diarrhea, volume depletion, hypocalcemia, hypokalemia and hypomagnesemia.  Had developed nausea vomiting and diarrhea and was seen in the emergency department November 8.  CT of abdomen at that time showed no obstruction.  Cholelithiasis present.  No evidence of UTI.  Was given antiemetics in emergency department and sent home.  Now receiving home health services.  Seen today by [employer].  She is agreeable to visit in this format today.  Identified using 2 identifiers as [name] [name]. [name] [name], a patient in this practice.  Review of Systems seen sitting in chair.  Looks frail.     Objective:   Physical Exam  Seen virtually sitting in chair.  Looks frail.  Husband is supportive and is with her.      Assessment & Plan:  Hypomagnesemia-level was 1.7 on November 26.  Needs to be taking oral magnesium at home.  Low magnesium level was found when she was  evaluated for diarrhea in November.  Needs to be consistently taking oral magnesium.  Fractures resulted in significant anemia.  Hemoglobin 5.9 on November 25 and at time of discharge was 9.6.  Received transfusion in hospital.    Fall resulted as she was going into her home from the car.  Will be receiving home PT.  Pelvic and left radial fracture secondary to fall  Chronic respiratory failure due to chronic pulmonary hypertension and scleroderma.  Is on home oxygen and stable.  We are staying in touch with home health nurse via phone and fax.  We will continue to check in on patient from time to time as needed.  Home health nurse will draw magnesium level and CBC in mid December.  Patient needs oral magnesium supplementation.  Has physical exam appointment here January 11  Time spent reviewing records and interviewing patient virtually 30 minutes

## 2019-12-29 DIAGNOSIS — S32592D Other specified fracture of left pubis, subsequent encounter for fracture with routine healing: Secondary | ICD-10-CM | POA: Diagnosis not present

## 2019-12-29 DIAGNOSIS — I6523 Occlusion and stenosis of bilateral carotid arteries: Secondary | ICD-10-CM | POA: Diagnosis not present

## 2019-12-29 DIAGNOSIS — I8312 Varicose veins of left lower extremity with inflammation: Secondary | ICD-10-CM | POA: Diagnosis not present

## 2019-12-29 DIAGNOSIS — I77819 Aortic ectasia, unspecified site: Secondary | ICD-10-CM | POA: Diagnosis not present

## 2019-12-29 DIAGNOSIS — M349 Systemic sclerosis, unspecified: Secondary | ICD-10-CM | POA: Diagnosis not present

## 2019-12-29 DIAGNOSIS — I700 Atherosclerosis of aorta: Secondary | ICD-10-CM | POA: Diagnosis not present

## 2019-12-29 DIAGNOSIS — E785 Hyperlipidemia, unspecified: Secondary | ICD-10-CM | POA: Diagnosis not present

## 2019-12-29 DIAGNOSIS — S52592D Other fractures of lower end of left radius, subsequent encounter for closed fracture with routine healing: Secondary | ICD-10-CM | POA: Diagnosis not present

## 2019-12-29 DIAGNOSIS — N183 Chronic kidney disease, stage 3 unspecified: Secondary | ICD-10-CM | POA: Diagnosis not present

## 2019-12-29 DIAGNOSIS — L97819 Non-pressure chronic ulcer of other part of right lower leg with unspecified severity: Secondary | ICD-10-CM | POA: Diagnosis not present

## 2019-12-29 DIAGNOSIS — I87321 Chronic venous hypertension (idiopathic) with inflammation of right lower extremity: Secondary | ICD-10-CM | POA: Diagnosis not present

## 2019-12-29 DIAGNOSIS — I2510 Atherosclerotic heart disease of native coronary artery without angina pectoris: Secondary | ICD-10-CM | POA: Diagnosis not present

## 2019-12-29 DIAGNOSIS — M419 Scoliosis, unspecified: Secondary | ICD-10-CM | POA: Diagnosis not present

## 2019-12-29 DIAGNOSIS — I739 Peripheral vascular disease, unspecified: Secondary | ICD-10-CM | POA: Diagnosis not present

## 2019-12-29 DIAGNOSIS — E039 Hypothyroidism, unspecified: Secondary | ICD-10-CM | POA: Diagnosis not present

## 2019-12-29 DIAGNOSIS — K8020 Calculus of gallbladder without cholecystitis without obstruction: Secondary | ICD-10-CM | POA: Diagnosis not present

## 2019-12-29 DIAGNOSIS — G629 Polyneuropathy, unspecified: Secondary | ICD-10-CM | POA: Diagnosis not present

## 2019-12-29 DIAGNOSIS — I672 Cerebral atherosclerosis: Secondary | ICD-10-CM | POA: Diagnosis not present

## 2019-12-29 DIAGNOSIS — I2721 Secondary pulmonary arterial hypertension: Secondary | ICD-10-CM | POA: Diagnosis not present

## 2019-12-29 DIAGNOSIS — S62102D Fracture of unspecified carpal bone, left wrist, subsequent encounter for fracture with routine healing: Secondary | ICD-10-CM | POA: Diagnosis not present

## 2019-12-29 DIAGNOSIS — D62 Acute posthemorrhagic anemia: Secondary | ICD-10-CM | POA: Diagnosis not present

## 2019-12-29 DIAGNOSIS — I129 Hypertensive chronic kidney disease with stage 1 through stage 4 chronic kidney disease, or unspecified chronic kidney disease: Secondary | ICD-10-CM | POA: Diagnosis not present

## 2019-12-29 DIAGNOSIS — J9611 Chronic respiratory failure with hypoxia: Secondary | ICD-10-CM | POA: Diagnosis not present

## 2019-12-29 DIAGNOSIS — D631 Anemia in chronic kidney disease: Secondary | ICD-10-CM | POA: Diagnosis not present

## 2019-12-29 DIAGNOSIS — M810 Age-related osteoporosis without current pathological fracture: Secondary | ICD-10-CM | POA: Diagnosis not present

## 2019-12-30 DIAGNOSIS — S52592D Other fractures of lower end of left radius, subsequent encounter for closed fracture with routine healing: Secondary | ICD-10-CM | POA: Diagnosis not present

## 2019-12-30 DIAGNOSIS — G629 Polyneuropathy, unspecified: Secondary | ICD-10-CM | POA: Diagnosis not present

## 2019-12-30 DIAGNOSIS — S62102D Fracture of unspecified carpal bone, left wrist, subsequent encounter for fracture with routine healing: Secondary | ICD-10-CM | POA: Diagnosis not present

## 2019-12-30 DIAGNOSIS — S32592D Other specified fracture of left pubis, subsequent encounter for fracture with routine healing: Secondary | ICD-10-CM | POA: Diagnosis not present

## 2019-12-30 DIAGNOSIS — M349 Systemic sclerosis, unspecified: Secondary | ICD-10-CM | POA: Diagnosis not present

## 2019-12-30 DIAGNOSIS — I739 Peripheral vascular disease, unspecified: Secondary | ICD-10-CM | POA: Diagnosis not present

## 2020-01-04 ENCOUNTER — Encounter (HOSPITAL_BASED_OUTPATIENT_CLINIC_OR_DEPARTMENT_OTHER): Payer: Medicare Other | Admitting: Internal Medicine

## 2020-01-04 ENCOUNTER — Telehealth: Payer: Self-pay

## 2020-01-04 DIAGNOSIS — G40909 Epilepsy, unspecified, not intractable, without status epilepticus: Secondary | ICD-10-CM | POA: Insufficient documentation

## 2020-01-04 DIAGNOSIS — M069 Rheumatoid arthritis, unspecified: Secondary | ICD-10-CM | POA: Diagnosis not present

## 2020-01-04 DIAGNOSIS — I2721 Secondary pulmonary arterial hypertension: Secondary | ICD-10-CM | POA: Insufficient documentation

## 2020-01-04 DIAGNOSIS — L97811 Non-pressure chronic ulcer of other part of right lower leg limited to breakdown of skin: Secondary | ICD-10-CM | POA: Diagnosis not present

## 2020-01-04 DIAGNOSIS — Z881 Allergy status to other antibiotic agents status: Secondary | ICD-10-CM | POA: Diagnosis not present

## 2020-01-04 DIAGNOSIS — L89616 Pressure-induced deep tissue damage of right heel: Secondary | ICD-10-CM | POA: Insufficient documentation

## 2020-01-04 DIAGNOSIS — I252 Old myocardial infarction: Secondary | ICD-10-CM | POA: Insufficient documentation

## 2020-01-04 DIAGNOSIS — Z882 Allergy status to sulfonamides status: Secondary | ICD-10-CM | POA: Diagnosis not present

## 2020-01-04 DIAGNOSIS — G8250 Quadriplegia, unspecified: Secondary | ICD-10-CM | POA: Diagnosis not present

## 2020-01-04 DIAGNOSIS — I83222 Varicose veins of left lower extremity with both ulcer of calf and inflammation: Secondary | ICD-10-CM | POA: Diagnosis not present

## 2020-01-04 DIAGNOSIS — J9610 Chronic respiratory failure, unspecified whether with hypoxia or hypercapnia: Secondary | ICD-10-CM | POA: Insufficient documentation

## 2020-01-04 DIAGNOSIS — M1990 Unspecified osteoarthritis, unspecified site: Secondary | ICD-10-CM | POA: Diagnosis not present

## 2020-01-04 DIAGNOSIS — Z87891 Personal history of nicotine dependence: Secondary | ICD-10-CM | POA: Diagnosis not present

## 2020-01-04 DIAGNOSIS — Z7902 Long term (current) use of antithrombotics/antiplatelets: Secondary | ICD-10-CM | POA: Insufficient documentation

## 2020-01-04 DIAGNOSIS — Z880 Allergy status to penicillin: Secondary | ICD-10-CM | POA: Insufficient documentation

## 2020-01-04 DIAGNOSIS — L97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: Secondary | ICD-10-CM | POA: Diagnosis not present

## 2020-01-04 DIAGNOSIS — M341 CR(E)ST syndrome: Secondary | ICD-10-CM | POA: Insufficient documentation

## 2020-01-04 DIAGNOSIS — I10 Essential (primary) hypertension: Secondary | ICD-10-CM | POA: Insufficient documentation

## 2020-01-04 DIAGNOSIS — I739 Peripheral vascular disease, unspecified: Secondary | ICD-10-CM | POA: Diagnosis not present

## 2020-01-04 DIAGNOSIS — Z9981 Dependence on supplemental oxygen: Secondary | ICD-10-CM | POA: Diagnosis not present

## 2020-01-04 NOTE — Telephone Encounter (Signed)
Called patine back and give her Dr Lauree Chandler advice, she verbalized understanding and does not think she needs to be COVID tested at this time.

## 2020-01-04 NOTE — Telephone Encounter (Signed)
May try saline nasal spray. I am a little reluctant to suggest decongestant with all of her medical problems. Hoping this will resolve in a few days. Does she need a Covid test?

## 2020-01-04 NOTE — Progress Notes (Signed)
AKAYA, PROFFIT ([id number]) Visit Report for 01/04/2020 HPI Details Patient Name: Date of Service: [name] [name], [name] [name] 01/04/2020 9:15 AM Medical Record Number:[id number] Patient Account Number: [id number] Date of Birth/Sex: Treating RN: 06-26-1951 (69 y.o. F) Primary Care Provider: [name] Other Clinician: Referring Provider: Treating Provider/Extender:Wilferd Ritson, Leotis Shames, MARY Weeks in Treatment: 406 History of Present Illness HPI Description: this is a patient who initially came to [place] for wounds on the medial malleoli bilaterally as well as her upper medial lower extremities bilaterally. These wounds eventually healed with assistance of Apligraf's bilaterally. While this was occurring she developed the current wound which opened into a fairly substantial wound on the right lower extremity. These are mostly secondary to venous stasis physiology however the patient also has underlying scleroderma, pulmonary hypertension. The wound has been making good progress lately with the Hydrofera Blue-based dressing. 03/17/2015; patient had a arterial evaluation a year ago. Her right ABI was 0.86 left was 1.0. Toe brachial index was 0.41 on the right 0.45 on the left. Her bilateral common femoral artery waveforms were triphasic. Her white popliteal posterior tibial artery and anterior tibial artery waveforms were monophasic with good amplitude. Luteal artery waveforms were biphasic it was felt that her bilateral great toe pressures are of normal although adequate for tissue healing. 03/24/2015. The condition of this wound is not really improved that. He was covered with as fibrinous surface slough and eschar. This underwent an aggressive debridement with both a curette and scalpel. I still cannot really get down to what I can would consider to be a viable surface. There is no evidence of infection. Previous workup for ischemia roughly a year ago was negative nevertheless I think that continues to be  a concern 04/07/15. The patient arrived for application of second Apligraf. Once again the surface of this wound is certainly less viable than I would like for an advanced treatment option. An aggressive debridement was done. She developed some arteriolar bleeding which required that along pressure and silver alginate. 04/21/15: change in the condition of this wound. Once again it is covered in a gelatinous surface slough. After debridement today surface of the wound looks somewhat better but now a heeling surface 05/05/15 Apligraf #4 applied.Still a lot of slough on this wound. 05/19/15 Apligraf #5 applied. Still a lot of slough on this wound I did not aggressively remove this 06/02/15; continued copious amounts of surface slough. This debridement fairly easily. 06/08/2015 -- the last time she had a venous duplex study done was over 3 years ago and the surgery was prior to that. I have recommended that she sees Dr. early for a another opinion regarding a repeat venous duplex and possibly more endovenous ablation of vein stripping of micro-phlebotomies. 06/16/15; wound has a gelatinous surface eschar that the debridement fairly easily to a point. I don't disagree with the venous workup and perhaps even arterial re-evaluation. She is on prednisone 5 mg and continue his medications for her pulmonary artery hypertension I am not sure if the latter have any wound care healing issues I would need to investigate this. 06/23/15 continues with a gelatinous surface eschar with of fibrinous underlying. What I can see of this wound does not look unhealthy however I just can't get this material which I think is 2 different layers off. Empiric culture done 06/30/15; unfortunately not a lot of change in this wound. A gelatinous surface eschar is easily removed however it has a tight fibrinous surface underneath the. 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 The wound comes back and basically and  unchanged state the. She has a gelatinous surface that is more easily removed however there is a tightly fibrinous surface underneath the. There is no evidence of infection. She has a vascular follow-up next month. I would have to inject her in order to do a more aggressive debridement of this area 07/21/15 the wound is roughly in the same state albeit the debridement is done with greater ease. There is less of the fibrinous eschar underneath the. There is no evidence of infection. She has follow-up with vascular surgery next week. No evidence of surrounding infection. Her original distal wounds healed while this one formed. 07/28/15; wound is easier to debride. No wound erythema. She is seeing Dr. [name] next week. 08/18/15 Has been to [place] for repeat ablation. Have been using medihoney pad with some improvement 08/25/15; absolutely no change in the condition of this wound in either its overall size or surface condition in many months now. At one point I had this down to [place] healthier surface I think with [facility] however this did not actually progress towards closure. Do not believe that the wound has actually deteriorated in terms of volume at all. We have been using a medihoney pad which allows easier debridement of the gelatinous eschar but again no overall actual improvement. the patient is going towards an ablation with pain and vascular which I think is scheduled for next week. The only other investigation that I could first see would be a biopsy. She does have underlying scleroderma 09/02/15 eschar is much easier to debridement however the base of this does not look particularly vibrant. We changed to Iodoflex. The patient had her ablation earlier this week 09/09/15 again the debridement over the base of this wound is easier and the base of the wound looks considerably better. We will continue the Iodoflex. Dr. Tawni Millers has expressed his satisfaction with the result of her  ablation 09/15/15 once again the wound is relatively free of surface eschar. No debridement was done today. It has a pale- looking base to it. although this is not as deep as it once was it seems to be expanding especially inferiorly. She has had recent venous ablation but this is no closer to healing.I've gone ahead and done a punch biopsy this from the inferior part of the wound close to normal skin 09/22/15: the wound is relatively free of surface eschar. There is some surrounding eschar. I'm not exactly sure at what level the surface is that I am seeing. 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 No evidence of atypical infection, malignancy. 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 No debridement was done 09/29/15; the wound surface is relatively free of surface eschar. There is eschar surrounding the walls of the wound. Aside from the improvement in the amount of surface slough. This wound has not progressed any towards closure. There is not even a surface that looks like there at this is ready. There is no evidence of any infection nor maligancy based on biopsy I did on 9/15. I continued with the Iodoflex however I am looking towards some alternative to try to promote some closure or filling in of this surface. Consider triple layer Oasis. Collagen did not result in adequate control of the surface slough 10/13/15; the patient was in hospital last week with severe anemia. The wound looks somewhat better after debridement although there is widening medially. There  is no evidence of infection. 10/20/15; patient's wound on the right lateral lower leg is essentially unchanged. This underwent a light surface debridement and in general the debridement is easier and the surface looks improved. I noted in doing this on the side of the wound what appears to be a piece of calcium deposition. The patient noted that she had previous things on the  tips of her fingers. 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 She follows with rheumatology and I have asked them to talk to her about this. In view of that S the nonhealing ulcer may have something to do with calcinosis and also unrelieved Raynaud's disease in this area. I should note that her original wounds on the right and left medial malleolus and the inner aspects of both legs just below the knees did however heal over 10/30/15; the patient's wound on the right lateral lower leg is essentially unchanged. I was able to remove some calcified material from the medial wound edge. 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 Otherwise the wound appears essentially unchanged there is less adherent surface eschar. Some of the calcified material was sent to pathology for analysis 11/17/15. The patient's wound on the right lateral leg is essentially unchanged. Wider Medially. 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 There is some surface slough but I did not debridement this. No evidence of surrounding infection. 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 This will make healing difficult 12/15/15; the wound has less depth surface has a fibrinous slough and calcifications in the wound edge no evidence of infection 12/22/15; the wound definitely has less Fibrinous slough on the base. Calcifications around the wound edges are still evident. Although the wound bed looks healthier it is still pale in appearance. Previous biopsy did not show malignancy 01/04/15; surgical debridement of nonviable  slough and subcutaneous tissue the wound cleans up quite nicely but appears to be expanding outward calcifications around the wound edges are still there. 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 She is to see her rheumatologist I'll see if he has any opinions on this. My punch biopsy done in September did not show calcifications although these are clearly evident. 01/19/16 light selective debridement of nonviable surface slough. There is epithelialization medially. This gives me reason for cautious optimism. She has been to see her rheumatologist, there is nothing that can be done for this type of soft tissue calcification associated with scleroderma 02/02/16 no debridement although there is a light surface slough. She has 2 peninsulas of skin 1 inferiorly and one medially. We continue to make a slight and slow but definite progressive here 02/16/16; light surface debridement with more attention to the circumference of the wound bed where the fibrinous eschar is more prevalent. No calcifications detected. She seems to have done nicely with the [facility] with some epithelialization and some improvement in the overall wound volume. 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 Continued attention to the circumference of the wound where the fibrinous eschar in  calcinosis or prevalent. No calcifications were detected. She has continued improvement with Hydrofera Blue. The wound is no longer as deep 03/15/16 surgical debridement done to remove surface escha Especially around the circumference of the wound where there is nonviable subcutaneous tissue. In spite of this there is considerable improvement in the overall dimensions and depth of the wound. Islands of epithelialization are seen especially medially inferiorly and superiorly to a  lesser extent. She is using Hydrofera Blue at home 03/29/16; surgical debridement done to remove surface eschar and nonviable subcutaneous tissue. This cleans up quite nicely mention slightly larger in terms of length and width however depth is less 04/12/16; continued gradual improvement in terms of depth and the condition of the wound base. Debridement is done. Continuing long standing Hydrofera Blue at home with Kerlix Coban wraps 04/26/16; continued gradual improvement in terms of depth and management as well as condition of the wound base. Surgical debridement done she continues with Hydrofera Blue. 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 She initially came to this clinic venous insufficiency ulcers which have long since healed 05/17/16 continued improvement in terms of the depth and measurements of this wound although she has a tightly adherent fibrinous slough each time. We've been continued with long standing Hydrofera Blue which seems to done as well for this wound is many advanced treatment options. 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 She also has chronic venous insufficiency. She has an irritation on her lateral right ankle secondary to our wraps 05/10/16; wound appears to be smaller especially on the medial aspect and especially in the width. Wound was debridement surface looks better. She is also been in the hospital apparently with anemia again she tells me she had an endoscopy. Since she got home after 3 days which I believe was sometime last week she has had an irritated painful area on the right lateral ankle surrounding the lateral malleolus 05/31/16; much more adherent surface slough today then recently although I don't think the dimensions of changed that much. A more aggressive debridement is required. The irritated area over her medial malleolus is more pruritic and painful and I don't think  represents cellulitis 06/14/16; no major change in her wound dimensions however there is more tightly adherent surface slough which is disappointing. As well as she appears to have a new small area medially. Furthermore an irritated uncomfortable area on the lateral aspect of her right foot just below the lateral malleolus. 06/21/16; I'm seeing the patient back in follow-up for the new areas under her major wound on the right anterior leg. She has been using Hydrofera Blue to this area probably for several months now and although the dressings seem to be helping for quite a period of time I think things have stagnated lately. She comes in today with a relatively tight adherent surface slough and really no changes in the wound shape or dimension. The 2 small areas she had inferiorly are tiny but still open they seem improved this well. There is no uncontrolled edema and I don't think there is any evidence of cellulitis. 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 Patient has had vascular evaluations both venous and arterial. I have biopsied this area. There is no obvious infection. The worrisome thing today is that she seems to be developing areas of erythema and  epithelial damage on the medial aspect of the right foot. Also to a lesser degree inferior to the actual wound itself. Again I see no obvious changes to suggest cellulitis however as this is the only treatable option I will probably give her antibiotics. 07/13/16 no major change in the lady's large anterior right leg wound. Still covered with a very tightly adherent surface slough which is difficult to debridement. There is less erythema around this, culture last week grew pseudomonas I gave her ciprofloxacin. The area on her lateral right malleolus looks better- 08/02/16 the patient's wounds continued to decline. Her original large  anterior right leg wound looks deeper. Still adherent surface slough that is difficult to debridement. She has a small area just below this and a punched-out wound just below her lateral malleolus. In the meantime she is been in hospital with apparently an upper GI bleed on Plavix and aspirin. She is now just on Plavix she received 3 units of packed cells 08/23/16; since I last saw this 3 weeks ago, the open large area on her right leg looks about the same syrup. She has a small satellite lesion just underneath this. The area on her medial right ankle is now a deep necrotic wound. I attempted to debridement this however there is just too much pain. 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 She follows with vascular surgery and is scheduled for an angiogram in early September 09/06/16; the patient is going for an arteriogram tomorrow. Her original large wound on the right calf is about the same the satellite lesion underneath it is about the same however the area on her medial ankle is now deeper with exposed tendon. I am no longer attempting to debride these wounds 09/20/16; the patient has undergone a right femoral endarterectomy and Dacron patch angioplasty. This seems to have helped the flow in her right leg. 10/04/16; Arrived today for aggressive debridement of the wounds on her right calf the original wound the one beneath it and a difficult area over her right lateral leg just above the lateral malleolus. 10/25/16; her 3 open wounds are about the same in terms of dimensions however the surface appears a lot healthier post debridement. Using Iodoflex 10/18/16 we have been using Iodoflex to her wounds which she tolerated with some difficulty. 10/11/16; has been using Santyl for a period of time with some improvement although again very adherent surface slough would prevent any attempted healing this. She has a original wound  on the left calf, the satellite underneath that and the most recent wound on the right medial ankle. She has completed revascularization by Dr. early and has had venous ablation earlier. Want to go back to Iodoflex to see if week and get a healthier surface to this wound bed failing this I think she'll need to be taken to the OR and I am prepared to call Dr. Marla Roe to discuss this. She is obviously not a good candidate for general anesthesia however.; 11/08/16; I put her on Iodoflex last time to see if I can get the wound bed any healthier and unfortunately today still had tremendous surface slough. 11/15/16; 4 weeks' worth of Iodoflex with not much improvement. Debridement on the major wounds on her left anterior leg is easier however this does not maintain from week to week. The punched-out area on her medial right ankle 11/29/16; I attempted to change the patient last visit to [facility]  however she states this burned and was very uncomfortable therefore we gave her permission to go back to the eye out of complex which she already had at home. Also she noted a lot of pain and swelling on the lateral aspect of her leg before she traveled to [facility] for the holidays, I called her in doxycycline over the phone. This seems to have helped 12/06/16; Wounds unchanged by in large. Using Iodoflex 12/13/16; her wounds today actually looks somewhat better. The area on the right lateral lower leg has reasonably healthy-looking granulation and perhaps as actually filled and a bit. Debridement of the 2 wounds on the medial calf is easier and post debridement appears to have a healthier base. We have referred her to Dr. Migdalia Dk for consideration of operative debridement 12/20/16; we have a quick note from Dr. Merri Ray who feels that the patient needs to be referred to an academic center/plastic surgeon. This is due to the complexity of the patient's medical issues as it  applies to anything in the OR. We have been using Iodoflex 12/27/16; in general the wounds on the right leg are better in terms of the difficult to remove surface slough. She has been using Iodoflex. She is approved for Apligraf which I anticipated ordering in the next week or 2 when we get a better-looking surface 01/04/16 the deep wounds on the right leg generally look better. Both of them are debrided further surface slough. The area on the lateral right leg was not debrided. She is approved for Apligraf I think I'll probably order this either next week or the week after depending on the surface of the wounds superiorly. We have been using Iodoflex which will continue until then 01/11/16; the deep wounds on the right leg again have a surface slough that requires debridement. I've not been able to get the wound bed on either one of these wounds down to what would be acceptable for an advanced skin stab- like Apligraf. The area on the medial leg has been improving. We have been using hot Iodoflex to all wounds which seems to do the best at at least limiting the nonviable surface 01/24/17; we have continued Iodoflex and all her wound areas. Her debridement Gen. he is easier and the surface underneath this looks viable. Nevertheless these are large area wounds with exposed muscle at least on the anterior parts. We have ordered Apligraf's for 2 weeks from now. The patient will be away next week 02/07/17; the patient was close to have first Apligraf today however we did not order one. I therefore replaced her Iodoflex. She essentially has 3 large punched-out areas on her right anterior leg and right medial ankle. 02/11/17; Apligraf #1 02/25/17; Apligraf #2. In general some improvement in the right medial ankle and right anterior leg wounds. The larger wound medially perhaps some better 03/11/17; Apligraf #3. In general continued improvement in the right medial ankle and the right anterior leg  wounds. 03/25/17 Apligraf #4. In general continued improvement especially on the right medial ankle and the lower 04/08/17; Apligraf #5 in general continued improvement in all wound sites. 04/22/17; post Apligraf #5 her wound beds continued to look a lot better all of them up to the surface of the surrounding skin. Had a caramel-colored slough that I did not debrided in case there is residual Apligraf effect. The wounds are as good as I have seen these looking quite some time. 04/29/17; we applied [place] and last week after completing her Apligraf.  Wounds look as though they've contracted somewhat although they have a nonviable surface which was problematic in the past. Apparently she has been approved for further Apligraf's. We applied Iodosorb today after debridement. 05/06/17; we're fortunate enough today to be able to apply additional Apligraf approved by her insurance. In general all of her wounds look better Apligraf #6 05/24/17; Apligraf #7 continued improvement in all wounds 3 06/10/17 Apligraf #8. Continued improvement in the surface of all wounds. Not much of an improvement in dimensions as I might a follow 06/24/17 Apligraf #9 continued general improvement although not as much change in the wound areas I might of like. She has a new open area on the right anterior lateral ankle very small and superficial. She also has a necrotic wound on the tip of her right index finger probably secondary to severe uncontrolled Raynaud's phenomenon. She is already on sildenafil and already seen her rheumatologist who gave her Keflex. 07/08/17; Apligraf #10. Generalized improvement although she has a small additional wound just medial to the major wound area. 07/22/17; after discussion we decided not to reorder any further Apligraf's although there is been considerable improvement with these it hasn't been recently. The major wound anteriorly looks better. Smaller wounds beneath this and the more recent  one and laterally look about the same. The area on the right lateral lower leg looks about the same 07/29/17 this is a patient who is exceedingly complex. She has advanced scleroderma, crest syndrome including calcinosis, PAD status post revascularization, chronic venous insufficiency status post ablations. She initially presented to this clinic with wounds on her bilateral lower legs however these closed. More recently we have been dealing with a large open area superiorly on the right anterior leg, a smaller wound underneath this and then another one on the right medial lower extremity. These improve significantly with 10 Apligraf applications. Over the last 2-3 weeks we are making good progress with Hydrofera Blue and these seem to be making progress towards closure 08/19/17; wounds continued to make good improvement with Hydrofera Blue and episodic aggressive debridements 08/26/17; still using Hydrofera Blue. Good improvements 09/09/17; using Hydrofera Blue continued improvement. Area on the lateral part of her right leg has only a small remaining open area. The small inferior satellite region is for all intents and purposes closed. Her major wound also is come in in terms of depth and has advancing epithelialization. 09/16/17; using Hydrofera Blue with continued improvement. The smaller satellite wound. We've closed out today along with a new were satellite wound medially. The area on her medial ankle is still open and her major wound is still open but making improvement. All using Hydrofera Blue. Currently 09/30/17; using Hydrofera Blue. Still a small open area on the lateral right ankle area and her original major wound seems to be making gradual and steady improvement. 10/14/17; still using Hydrofera Blue. Still too small open areas on the right lateral ankle. Her original major wound is horizontal and linear. The most problematic area paradoxically seems to be the area to the medial area wears  I thought it would be the lateral. 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 10/28/17; still using Hydrofera Blue. Right lateral ankle has a very small open area superiorly on the most lateral part of the wound. Her original open wound has 2 open areas now separated by normal skin and we've redefined this. 11/11/17; still using Hydrofera Blue area and right lateral ankle continues to have  a small open area on mostly the lateral part of the wound. Her original wound has 2 small open areas now separated by a considerable amount of normal skin 11/28/17; the patient called in slightly before Thanksgiving to report pain and erythema above the wound on the right leg. In the past this is responded well to treatment for cellulitis and I gave her over the phone doxycycline. She stated this resulted in fairly abrupt improvement. We have been using Hydrofera Blue for a prolonged period of time to the larger wound anteriorly into the remaining wound on her right right lateral ankle. The latter is just about closed with only a small linear area and the bottom of the [place]. 12/02/17; use endoform and left the dressing on since last visit. There is no tenderness and no evidence of infection. 12/16/17; patient has been using Endoform but not making much progress. The 2 punched out open areas anteriorly which were the reminiscence of her major wound appear deeper. The area on the lateral aspect of her right calf also appears deeper. Also she has a puzzling tender swelling above her wound on the right leg. This seems larger than last time. Just above her wounds there appears to be some fluctuance in this area it is not erythematous and there is no crepitus 12/30/17; patient has been using Endoform up until last week we used Hydrofera Blue. Ultrasound of the swelling above her 2 major wounds last time was negative for a fluid collection. I gave her cefaclor for the  erythema and tenderness in this area which seems better. Unfortunately both punched out areas anteriorly and the area on her right medial lower leg appear deeper. In fact the lateral of the wounds anteriorly actually looks as though it has exposed tendon and/or muscle sheath. She is not systemically unwell. She is complaining of vaginitis type symptoms presumably Candida from her antibiotics. 01/06/18; we're using santyl. she has 2 punched out areas anteriorly which were initially part of a large wound. Unfortunately medially this is now open to tendon/muscle. All the wounds have the same adherent very difficult to debride surface. 01/20/18; 2 week follow-up using Santyl. She has the 2 punched out areas anteriorly which were initially part of her large surface wound there. Medially this still has exposed muscle. All of these have the same tightly adherent necrotic surface which requires debridement. PuraPly was not accepted by the patient's insurance however her insurance I think it changed therefore we are going to run Apligraf to gain 02/03/18; the patient has been using Santyl. The wound on the right lateral ankle looks improved and the 2 areas anteriorly on the right leg looks about the same. The medial one has exposed muscle. The lateral 1 requiresdebridement. We use PuraPly today for the first week 02/10/18; PuraPly #2. The patient has 3 wounds. The area on the right lateral ankle, 2 areas anteriorly that were part of her original large wound in this area the medial one has exposed muscle. All of the wounds were lightly debrided with a number 3 curet. PuraPly #2 applied the lateral wound on the calf and the right lateral ankle look better. 02/17/18; PuraPly #3. Patient has 3 wounds. The area on the right lateral ankle in 2 areas internally that were part of her original large wound. The lateral area has exposed muscle. She arrived with some complaints of pain around the right ankle. 02/24/18;  PuraPly #4; not much change in any of the 3 wound areas. Right lateral ankle,  right lateral calf. Both of these required debridement with a #3 curet. She tolerates this marginally. The area on the medial leg still has exposed muscle. Not much change in dimensions 03/03/18 PuraPly #5. The area on the medial ankle actually looks better however the 2 separate areas that were original parts of the larger right anterior leg wound look as though they're attempting to coalesce. 03/10/18; PuraPly #6. The area on the medial ankle actually continues to look and measures smaller however the 2 separate areas that were part of the original large wound on the right anterior leg have now coalesced. There hasn't been much improvement here. The lateral area actually has underlying exposed muscle 03/17/18-she is here in follow-up evaluation for ulcerations to the right lower extremity. She is voicing no complaints or concerns. She tolerated debridement. Puraply#7 placed 03/24/18; difficult right lower extremity ulcerations. PuraPly #8 place. She is been approved for [employer]. She did very well with Apligraf today however she is apparently reached her "lifetime max" 03/31/18; marginal improvement with PuraPly although her wounds looked as good as they have in several weeks today. Used TheraSkins #1 04/14/18 TheraSkin #2 today 04/28/18 TheraSkins #3. Wound slightly improved 05/12/18; TheraSkin skin #4. Wound response has been variable. 05/27/18 TheraSkin #5. Generally improvement in all wound areas. I've also put her in 3 layer compression to help with the severe venous hypertension 06/09/18; patient is done quite well with the TheraSkins unfortunately we have no further applications. I also put her in 3 layer compression last week and that really seems to of helped. 06/16/18; we have been using silver collagen. Wounds are smaller. Still be open area to the muscle layer of her calf however even that is contracted  somewhat. She tells me that at night sometimes she has pain on the right lateral calf at the site of her lower wound. Notable that I put her into 3 layer compression about 3 weeks ago. She states that she dangles her leg over the bed that makes it feel better but she does not describe claudication during the day She is going to call her secondary insurance to see if they will continue to cover advanced treatment products I have reviewed her arterial studies from 01/22/17; this showed an ABI in the right of 1 and on the left noncompressible. TBI on the right at 0.30 on the left at 0.34. It is therefore possible she has significant PAD with medial calcification falsely elevating her ABI into the normal range. I'll need to be careful about asking her about this next week it's possible the 3 layer compression is too much 06/23/18; was able to reapply TheraSkin 1 today. Edema control is good and she is not complaining of pain no claudication 07/07/18;no major change. New wound which was apparently a taper removal injury today in our clinic between her 2 wounds on the right calf TheraSkins #2 07/14/18; I think there is some improvement in the right lateral ankle and the medial part of her wound. There is still exposed muscle medially. 07/28/18; two-week follow-up. TheraSkins#3. Unfortunately no major change. 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 She seems to be doing well and in the base of the wound appears to show some progress at this point. This is the last approved [employer]askin of the second set. 08/25/18; she has completed  TheraSkin. There has been some improvement on the right lateral calf wound as well as the anterior leg wounds. The open area to muscle medially on the anterior leg wound is smaller. I'm  going to transition her back to [facility] under Kerlix Coban change every second day. She reports that she had some calcification removed from her right upper arm. We have had previous problems with calcifications in her wounds on her legs but that has not happened recently 09/08/18;using Hydrofera Blue on both her wound areas. Wounds seem to of contracted nicely. She uses Kerlix Coban wrap and changes at home herself 09/22/2018; using Hydrofera Blue on both her wound areas. Dimensions seem to have come down somewhat. There is certainly less depth in the medial part of the mid tibia wound and I do not think there is any exposed muscle at this point. 10/06/2018; 2-week follow-up. Using [facility] on her wound areas. Dimensions have come down nicely both on the right lateral ankle area in the right mid tibial area. She has no new complaints 10/20/2018; 2-week follow-up. She is using Hydrofera Blue. Not much change from the last time she was here. The area on the lateral ankle has less depth although it has raised edges on one side. I attempted to remove as much of the raised edge as I could without creating more additional wounds. The area on the right anterior mid tibia area looks the same. 11/03/2018; 2-week follow-up she is using Hydrofera Blue. On the right anterior leg she now has 2 wounds separated by a large area of normal skin. The area on the medial part still has I think exposed muscle although this area itself is a lot smaller. The area laterally has some depth. Both areas with necrotic debris. The area on her right lateral ankle has come in nicely 11/17/2018; patient continues to use Hydrofera Blue. We have been increasing separation of the 2 wounds anteriorly which were at one point joint the area on the right lateral calf continues to have I think some improvement in depth. 12/08/2018; patient continues to use Hydrofera Blue. There is some improvement in the area on the right  lateral calf. The 2 areas that were initially part of the original large wound in the mid right tibia are probably about the same. In fact the medial area is probably somewhat larger. We will run puraply through the patient's insurance 12/22/2018; she has been using Hydrofera Blue. We have small wounds on the right lateral calf and 2 small areas that were initially part of a large wound in the right mid tibia. We applied pure apply #1 today. 12/29/2018; we applied puraply #2. Her wounds look somewhat better especially on the right lateral calf and the lateral part of her original wound in the mid tibial area. 01/05/2019; perhaps slightly improved in terms of wound bed condition but certainly not as much improvement as I might of liked. Puraply #3 1/13: we did not have a correct sized puraply to apply. wounds more pinched out looking, I increased her compression to 3 layer last week to help with significant multilevel venous hypertension. Since then I've reviewed her arterial status. She has a right femoral endarterectomy and a distal left SFA stent. She was being followed by Dr. [name] for a period however she does not appear to have seen him in 3 years. I will set up an appointment. 1/20. The patient has an additional wound on the right lateral calf between the distal wound and proximal  wounds. We did not have Puraply last week. Still does not have a follow-up with Dr. early 1/27: Follow-up with Dr. early has been arranged apparently with follow-up noninvasive studies. Wounds are measuring roughly the same although they certainly look smaller 2/3; the patient had non invasive studies. Her ABIs on the right were 0.83 and on the left 1.02 however there was no great toe pressure bilaterally. Also worrisome monophasic waveforms at the PTA and dorsalis pedis. We are still using Puraply. We have had some improvement in all of the wounds especially the lateral part of the mid tibial area. 2/10; sees Dr.  early of vein and vascular re-arterial studies next week. Puraply reapplied today. 2/17; Dr. early of vein and vascular his appointment is tomorrow. Puraply reapplied after debridement of all wounds 2/24; the patient saw Dr. early I reviewed his note. sHe noted the previous right femoral endarterectomy with a Dacron patch. He also noted the normal ABI and the monophasic waveforms suggesting tibial disease. Overall he did not feel that she had any evidence of arterial insufficiency that would impair her wound healing. He did note her venous disease as well. He suggested PRN follow-up. 3/2; I had the last puraply applied today. The original wounds over the mid tibia area are improved where is the area on the right lower leg is not 3/9; wounds are smaller especially in the right mid tibia perhaps slightly in the right lateral calf. We finished with puraply and went to endoform today 3/23; the patient arrives after 2 weeks. She has been using endoform. I think all of her wounds look slightly better which includes the area on the right lateral calf just above the right lateral malleolus and the 2 in the right mid tibia which were initially part of the same wound. 4/27; TELEHEALTH visit; the patient was seen for telehealth visit today with her consent in the middle of the worldwide epidemic. Since she was last here she called in for antibiotics with pain and tenderness around the area on the right medial ankle. I gave her empiric doxycycline. She states this feels better. She is using endoform on both of these areas 5/11 TELEHEALTH; the patient was seen for telehealth visit today. She was accompanied at home by her husband. She has severe pulmonary hypertension accompanied scleroderma and in the face of the Covid epidemic cannot be safely brought into our clinic. We have been using endoform on her wound areas. There are essentially 3 wound areas now in the left mid tibia now 2 open areas that it one  point were connected and one on the right lateral ankle just above the malleolus. 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 She cannot come to clinic. The wounds were reviewed today via telehealth. She has severe chronic venous hypertension which I think is centrally mediated. She has wounds on her right anterior tibia and right lateral ankle area. These are chronic. She has been using endoform. 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 We have been following her from telehealth. She has severe chronic venous hypertension which may be mostly centrally mediated secondary to right heart heart failure. She has wounds on her right anterior tibia and right lateral ankle these  are chronic we have been using endoform 6/22; TELEHEALTH; this patient was seen today via telehealth. 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 Since the last time we had contact with this patient she developed some pain and erythema around the wound on her right lateral malleolus/ankle and we put in antibiotics for her. This is resulted in good improvement with resolution of the erythema and tenderness. I changed her to silver alginate last time. We had been using endoform for an extended period of time 7/13; TELEHEALTH; this patient was seen today via telehealth. 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 She would continue to be at a prohibitive risk to be brought into our clinic unless this was absolutely necessary. These visits have been done with her approval as well as her husband. We  have been using silver alginate to the areas on the right mid tibia and right lateral lower leg. 7/27 TELEHEALTH; patient was seen along with her husband today via telehealth. 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 We changed her to sample last visit. She has 2 areas a chronic wound on her right mid tibia and one just above her ankle. These were not the original wounds when she came into this clinic but she developed them during treatment 8/17; she comes in for her first face-to-face visit in a long period. She has a remaining area just medial to the right tibia which is the last open part of her large wound across this area. She also has an area on the right lateral lower leg. We prescribed Santyl last telehealth visit but they were concerned that this was making a deeper so they put silver alginate on it last week. Her husband changes the dressings. 8/31; using Santyl to the 2 wound areas some improvement in wound surfaces. Husband has surgery in 2 weeks we will put her out 3 weeks. Any of the advanced treatment options that I can think of that would be eligible for this wound would also cause her to have to come in weekly. The risk that the patient is just too high 9/21. Using Santyl to the 2 wound areas. Both of these are somewhat better although the medial mid tibia area still has exposed muscle. Lateral ankle requiring debridement. Using Santyl 10/12; using Santyl to the 2 wound areas. One on the right lateral ankle and the other in the medial calf which still has exposed muscle. Both areas have come down in size and have better looking surfaces. She has made nice progress with santyl 11/2; we have been using Santyl to the 2 wound areas. Right lateral ankle and the other in the mid tibia area the medial part of this still has exposed muscle. 11/23/2019 on evaluation today patient appears to be  doing about the same really with regard to her wounds. She is actually not very pleased with how things seem to be progressing at this point she tells me that she really has not noted much improvement unfortunately. With that being said there is no signs of active infection at this time. There is some slough buildup noted at this time which again along with some dry skin around the edges of the wound I think would benefit her to try to debride some of this away. Fortunately her pain is doing fairly  well. She still has exposed muscle in the right medial/tibial area. 12/14; TELEHEALTH; she was changed to [facility] to the right calf wound and right lateral ankle wound when she was here last time. Unfortunately since then she had a fall with a pelvic fracture and a fracture of her wrist. She was apparently hospitalized for 5 days. I have not looked at her discharge summary. She apparently came out of the hospital with a blister on her right heel. She was seen today via telehealth by myself and our case manager. The patient and her husband were present. She has been using Hydrofera Blue at our direction from the last time she was in the clinic. There is been no major improvement in fact the areas appear deeper and with a less viable surface 01/04/2020; TELEHEALTH; the patient was seen today in accompaniment of her husband and our nurse. She has 3 open areas 1 on the right medial mid tibia, one on the right lateral ankle and a large eschared pressure area on the right heel. We have been using Iodoflex to the 2 original wounds. 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 It is simply too perilous for her to be seen in any other way Electronic Signature(s) Signed: 01/04/2020 4:56:38 PM By: Linton Ham MD Entered By: Linton Ham on 01/04/2020 13:15:07 -------------------------------------------------------------------------------- Physical Exam Details Patient  Name: Date of Service: [name] [name], [name] F. 01/04/2020 9:15 AM Medical Record Number:[id number] Patient Account Number: [id number] Date of Birth/Sex: Treating RN: 01/26/1951 (69 y.o. F) Primary Care Provider: [name] Other Clinician: Referring Provider: Treating Provider/Extender:Cresta Riden, Leotis Shames, MARY Weeks in Treatment: 406 Constitutional Sitting or standing Blood Pressure is within target range for patient.. Pulse regular and within target range for patient.Marland Kitchen Respirations regular, non-labored and within target range.. Temperature is normal and within the target range for the patient.Marland Kitchen Appears in no distress. Notes Wound exam; the area on the mid tibia has exposed muscle and looks about the same. Right lateral ankle some surrounding erythema but this is nontender. The wound itself looks somewhat better. Large eschared black area over the right heel which is an unstageable pressure ulcer. Electronic Signature(s) Signed: 01/04/2020 4:56:38 PM By: Linton Ham MD Entered By: Linton Ham on 01/04/2020 13:15:58 -------------------------------------------------------------------------------- Physician Orders Details Patient Name: Date of Service: JANAY, CANAN 01/04/2020 9:15 AM Medical Record Number:[id number] Patient Account Number: [id number] Date of Birth/Sex: Treating RN: [birth year] (69 y.o. [name] Primary Care Provider: [name] Other Clinician: Referring Provider: Treating Provider/Extender:Zamari Bonsall, Leotis Shames, MARY Weeks in Treatment: [phone] Verbal / Phone Orders: No Diagnosis Coding Follow-up Appointments Return appointment in 3 weeks. - Televisit Dressing Change Frequency Wound #5 Right,Medial Lower Leg Change Dressing every other day. Wound #7 Right,Lateral Malleolus Change Dressing every other day. Wound #16 Right Calcaneus Change dressing every day. Skin Barriers/Peri-Wound Care Moisturizing lotion - both legs Wound Cleansing May shower  and wash wound with soap and water. Primary Wound Dressing Wound #5 Right,Medial Lower Leg Iodoflex - may use Iodosorb gel if Iodoflex not available Wound #7 Right,Lateral Malleolus Iodoflex - may use Iodosorb gel if Iodoflex not available Wound #16 Right Calcaneus Other: - Paint with Betadine Secondary Dressing Wound #5 Right,Medial Lower Leg Dry Gauze Wound #7 Right,Lateral Malleolus Dry Gauze Wound #16 Right Calcaneus Heel Cup Edema Control Kerlix and Coban - Right Lower Extremity Avoid standing for long periods of time Elevate legs to the level of the heart or above for 30 minutes daily and/or when sitting, a frequency  of: - throughout the day Off-Loading Turn and reposition every 2 hours Other: - float heels off of bed/chair with pillow under calves [facility] for wound care. Alvis Lemmings Electronic Signature(s) Signed: 01/04/2020 4:56:38 PM By: Linton Ham MD Signed: 01/04/2020 5:01:35 PM By: Levan Hurst RN, BSN Entered By: Levan Hurst on 01/04/2020 09:35:17 -------------------------------------------------------------------------------- Problem List Details Patient Name: Date of Service: ZILPHIA, KOZINSKI 01/04/2020 9:15 AM Medical Record Number:[id number] Patient Account Number: [id number] Date of Birth/Sex: Treating RN: October 06, 1951 (69 y.o. 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 10/04/2016 No Yes and inflammation of right lower extremity L89.616 Pressure-induced deep tissue damage of right heel 12/14/2019 No Yes Inactive Problems ICD-10 Code Description Active Date Inactive Date 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/19/2016 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 F) Primary Care Provider: [name] Other Clinician: Referring Provider: Treating Provider/Extender:Dynasti Kerman, Leotis Shames, MARY Weeks in Treatment: 406 Subjective History of Present Illness (HPI) this is a patient who initially came to [place] for wounds on the medial malleoli bilaterally as well as her upper medial lower extremities bilaterally. These wounds eventually healed with assistance of Apligraf's bilaterally. While this was occurring she developed the current wound which opened into a fairly substantial wound on the right lower extremity. These are mostly secondary to venous stasis physiology however the patient also has underlying scleroderma, pulmonary hypertension. The wound has been making good progress lately with the Hydrofera Blue-based dressing. 03/17/2015; patient had a arterial evaluation a year ago. Her right ABI was 0.86 left was 1.0. Toe brachial index was 0.41 on the right 0.45 on the left. Her bilateral common femoral artery waveforms were triphasic. Her white popliteal posterior tibial  artery and anterior tibial artery waveforms were monophasic with good amplitude. Luteal artery waveforms were biphasic it was felt that her bilateral great toe pressures are of normal although adequate for tissue healing. 03/24/2015. The condition of this wound is not really improved that. He was covered with as fibrinous surface slough and eschar. This underwent an aggressive debridement with both a curette and scalpel. I still cannot really get down to what I can would consider to be a viable surface. There is no evidence of infection. Previous workup for ischemia roughly a year ago was negative nevertheless I think that continues to be a concern 04/07/15. The patient arrived for application of second Apligraf. Once again the surface of this wound is certainly less viable than I would like for an advanced treatment option. An aggressive debridement was done. She developed some arteriolar bleeding which required that along pressure and silver alginate. 04/21/15: change in the condition of this  wound. Once again it is covered in a gelatinous surface slough. After debridement today surface of the wound looks somewhat better but now a heeling surface 05/05/15 Apligraf #4 applied.Still a lot of slough on this wound. 05/19/15 Apligraf #5 applied. Still a lot of slough on this wound I did not aggressively remove this 06/02/15; continued copious amounts of surface slough. This debridement fairly easily. 06/08/2015 -- the last time she had a venous duplex study done was over 3 years ago and the surgery was prior to that. I have recommended that she sees Dr. early for a another opinion regarding a repeat venous duplex and possibly more endovenous ablation of vein stripping of micro-phlebotomies. 06/16/15; wound has a gelatinous surface eschar that the debridement fairly easily to a point. I don't disagree with the venous workup and perhaps even arterial re-evaluation. She is on prednisone 5 mg and continue his  medications for her pulmonary artery hypertension I am not sure if the latter have any wound care healing issues I would need to investigate this. 06/23/15 continues with a gelatinous surface eschar with of fibrinous underlying. What I can see of this wound does not look unhealthy however I just can't get this material which I think is 2 different layers off. Empiric culture done 06/30/15; unfortunately not a lot of change in this wound. A gelatinous surface eschar is easily removed however it has a tight fibrinous surface underneath the. 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 The wound comes back and basically and unchanged state the. She has a gelatinous surface that is more easily removed however there is a tightly fibrinous surface underneath the. There is no evidence of infection. She has a vascular follow-up next month. I would have to inject her in order to do a more aggressive debridement of this area 07/21/15 the wound is roughly in the same state albeit the debridement is done with greater ease. There is less of the fibrinous eschar underneath the. There is no evidence of infection. She has follow-up with vascular surgery next week. No evidence of surrounding infection. Her original distal wounds healed while this one formed. 07/28/15; wound is easier to debride. No wound erythema. She is seeing Dr. [name] next week. 08/18/15 Has been to [place] for repeat ablation. Have been using medihoney pad with some improvement 08/25/15; absolutely no change in the condition of this wound in either its overall size or surface condition in many months now. At one point I had this down to [place] healthier surface I think with [facility] however this did not actually progress towards closure. Do not believe that the wound has actually deteriorated in terms of volume at all. We have been using a medihoney pad which allows easier debridement of the gelatinous eschar but again  no overall actual improvement. the patient is going towards an ablation with pain and vascular which I think is scheduled for next week. The only other investigation that I could first see would be a biopsy. She does have underlying scleroderma 09/02/15 eschar is much easier to debridement however the base of this does not look particularly vibrant. We changed to Iodoflex. The patient had her ablation earlier this week 09/09/15 again the debridement over the base of this wound is easier and the base of the wound looks considerably better. We will continue the Iodoflex. Dr. Tawni Millers has expressed his satisfaction with the result of her ablation 09/15/15 once again the wound is relatively free of  surface eschar. No debridement was done today. It has a pale- looking base to it. although this is not as deep as it once was it seems to be expanding especially inferiorly. She has had recent venous ablation but this is no closer to healing.I've gone ahead and done a punch biopsy this from the inferior part of the wound close to normal skin 09/22/15: the wound is relatively free of surface eschar. There is some surrounding eschar. I'm not exactly sure at what level the surface is that I am seeing. 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 No evidence of atypical infection, malignancy. 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 No debridement was done 09/29/15; the wound surface is relatively free of surface eschar. There is eschar surrounding the walls of the wound. Aside from the improvement in the amount of surface slough. This wound has not progressed any towards closure. There is not even a surface that looks like there at this is ready. There is no evidence of any infection nor maligancy based on biopsy I did on 9/15. I continued with the Iodoflex however I am looking towards some alternative to try to promote some closure or filling in of this surface.  Consider triple layer [place]. Collagen did not result in adequate control of the surface slough 10/13/15; the patient was in hospital last week with severe anemia. The wound looks somewhat better after debridement although there is widening medially. There is no evidence of infection. 10/20/15; patient's wound on the right lateral lower leg is essentially unchanged. This underwent a light surface debridement and in general the debridement is easier and the surface looks improved. I noted in doing this on the side of the wound what appears to be a piece of calcium deposition. The patient noted that she had previous things on the tips of her fingers. 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 She follows with rheumatology and I have asked them to talk to her about this. In view of that S the nonhealing ulcer may have something to do with calcinosis and also unrelieved Raynaud's disease in this area. I should note that her original wounds on the right and left medial malleolus and the inner aspects of both legs just below the knees did however heal over 10/30/15; the patient's wound on the right lateral lower leg is essentially unchanged. I was able to remove some calcified material from the medial wound edge. 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 Otherwise the wound appears essentially unchanged there is less adherent surface eschar. Some of the calcified material was sent to pathology for analysis 11/17/15. The patient's wound on the right lateral leg is essentially unchanged. Wider Medially. 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 There is  some surface slough but I did not debridement this. No evidence of surrounding infection. 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 This will make healing difficult 12/15/15; the wound has less depth surface has a fibrinous slough and calcifications in the wound edge no evidence of infection 12/22/15; the wound definitely has less Fibrinous slough on the base. Calcifications around the wound edges are still  evident. Although the wound bed looks healthier it is still pale in appearance. Previous biopsy did not show malignancy 01/04/15; surgical debridement of nonviable slough and subcutaneous tissue the wound cleans up quite nicely but appears to be expanding outward calcifications around the wound edges are still there. 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 She is to see her rheumatologist I'll see if he has any opinions on this. My punch biopsy done in September did not show calcifications although these are clearly evident. 01/19/16 light selective debridement of nonviable surface slough. There is epithelialization medially. This gives me reason for cautious optimism. She has been to see her rheumatologist, there is nothing that can be done for this type of soft tissue calcification associated with scleroderma 02/02/16 no debridement although there is a light surface slough. She has 2 peninsulas of skin 1 inferiorly and one medially. We continue to make a slight and slow but definite progressive here 02/16/16; light surface debridement with more attention to the circumference of the wound bed where the fibrinous eschar is more prevalent. No calcifications detected. She seems to have done nicely with the [facility] with some epithelialization and some improvement in the overall wound volume. 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 Continued attention to the circumference of the wound where the fibrinous eschar in calcinosis or prevalent. No calcifications were detected. She has continued improvement with Hydrofera Blue. The wound is no longer as deep 03/15/16 surgical debridement done to remove surface escha Especially around the circumference of the wound where there is nonviable subcutaneous tissue. In spite of this there is considerable improvement in the overall dimensions and depth of the wound. Islands of epithelialization are seen especially medially inferiorly and superiorly to a lesser extent. She is using Hydrofera Blue at home 03/29/16; surgical debridement done to remove surface eschar and nonviable subcutaneous tissue. This cleans up quite nicely mention slightly larger in terms of length and width however depth is less 04/12/16; continued gradual improvement in terms of depth and the condition of the wound base. Debridement is done. Continuing long standing Hydrofera Blue at home with Kerlix Coban wraps 04/26/16; continued gradual improvement in terms of depth and management as well as condition of the wound base. Surgical debridement done she continues with Hydrofera Blue. 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 She initially came to this clinic venous insufficiency ulcers which have long since healed 05/17/16 continued improvement in terms of the depth and measurements of this wound although she has a tightly adherent fibrinous slough each time. We've been continued with long standing Hydrofera Blue which seems to done as well for this wound is many advanced treatment options. 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 She also has chronic venous insufficiency. She has an irritation on her lateral right ankle secondary to our wraps 05/10/16; wound appears to be  smaller especially on the medial aspect and especially in the width. Wound was debridement surface looks better. She is also been in the hospital apparently with anemia again she tells me she had an endoscopy. Since she got home after 3 days which I believe was sometime last week she has had an irritated painful area on the right lateral ankle surrounding the lateral malleolus 05/31/16; much more adherent surface slough today then recently although  I don't think the dimensions of changed that much. A more aggressive debridement is required. The irritated area over her medial malleolus is more pruritic and painful and I don't think represents cellulitis 06/14/16; no major change in her wound dimensions however there is more tightly adherent surface slough which is disappointing. As well as she appears to have a new small area medially. Furthermore an irritated uncomfortable area on the lateral aspect of her right foot just below the lateral malleolus. 06/21/16; I'm seeing the patient back in follow-up for the new areas under her major wound on the right anterior leg. She has been using Hydrofera Blue to this area probably for several months now and although the dressings seem to be helping for quite a period of time I think things have stagnated lately. She comes in today with a relatively tight adherent surface slough and really no changes in the wound shape or dimension. The 2 small areas she had inferiorly are tiny but still open they seem improved this well. There is no uncontrolled edema and I don't think there is any evidence of cellulitis. 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 Patient has had vascular evaluations both venous and arterial. I have biopsied this area. There is no obvious infection. The worrisome thing today is that she seems to be developing areas of erythema and  epithelial damage on the medial aspect of the right foot. Also to a lesser degree inferior to the actual wound itself. Again I see no obvious changes to suggest cellulitis however as this is the only treatable option I will probably give her antibiotics. 07/13/16 no major change in the lady's large anterior right leg wound. Still covered with a very tightly adherent surface slough which is difficult to debridement. There is less erythema around this, culture last week grew pseudomonas I gave her ciprofloxacin. The area on her lateral right malleolus looks better- 08/02/16 the patient's wounds continued to decline. Her original large anterior right leg wound looks deeper. Still adherent surface slough that is difficult to debridement. She has a small area just below this and a punched-out wound just below her lateral malleolus. In the meantime she is been in hospital with apparently an upper GI bleed on Plavix and aspirin. She is now just on Plavix she received 3 units of packed cells 08/23/16; since I last saw this 3 weeks ago, the open large area on her right leg looks about the same syrup. She has a small satellite lesion just underneath this. The area on her medial right ankle is now a deep necrotic wound. I attempted to debridement this however there is just too much pain. 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 She follows with vascular surgery and is scheduled for an angiogram in early September 09/06/16; the patient is going for an arteriogram tomorrow. Her original large wound on the right calf is about the same the satellite lesion underneath it is about the same however the area on her medial ankle is now deeper with exposed tendon. I am no longer attempting to debride these wounds 09/20/16; the patient has undergone a right femoral endarterectomy and Dacron patch angioplasty. This seems to have helped the flow in her  right leg. 10/04/16; Arrived today for aggressive debridement of the wounds on her right calf the original  wound the one beneath it and a difficult area over her right lateral leg just above the lateral malleolus. 10/25/16; her 3 open wounds are about the same in terms of dimensions however the surface appears a lot healthier post debridement. Using Iodoflex 10/18/16 we have been using Iodoflex to her wounds which she tolerated with some difficulty. 10/11/16; has been using Santyl for a period of time with some improvement although again very adherent surface slough would prevent any attempted healing this. She has a original wound on the left calf, the satellite underneath that and the most recent wound on the right medial ankle. She has completed revascularization by Dr. early and has had venous ablation earlier. Want to go back to Iodoflex to see if week and get a healthier surface to this wound bed failing this I think she'll need to be taken to the OR and I am prepared to call Dr. Marla Roe to discuss this. She is obviously not a good candidate for general anesthesia however.; 11/08/16; I put her on Iodoflex last time to see if I can get the wound bed any healthier and unfortunately today still had tremendous surface slough. 11/15/16; 4 weeks' worth of Iodoflex with not much improvement. Debridement on the major wounds on her left anterior leg is easier however this does not maintain from week to week. The punched-out area on her medial right ankle 11/29/16; I attempted to change the patient last visit to [facility] however she states this burned and was very uncomfortable therefore we gave her permission to go back to the eye out of complex which she already had at home. Also she noted a lot of pain and swelling on the lateral aspect of her leg before she traveled to [facility] for the holidays, I called her in doxycycline over the phone. This seems to have  helped 12/06/16; Wounds unchanged by in large. Using Iodoflex 12/13/16; her wounds today actually looks somewhat better. The area on the right lateral lower leg has reasonably healthy-looking granulation and perhaps as actually filled and a bit. Debridement of the 2 wounds on the medial calf is easier and post debridement appears to have a healthier base. We have referred her to Dr. Migdalia Dk for consideration of operative debridement 12/20/16; we have a quick note from Dr. Merri Ray who feels that the patient needs to be referred to an academic center/plastic surgeon. This is due to the complexity of the patient's medical issues as it applies to anything in the OR. We have been using Iodoflex 12/27/16; in general the wounds on the right leg are better in terms of the difficult to remove surface slough. She has been using Iodoflex. She is approved for Apligraf which I anticipated ordering in the next week or 2 when we get a better-looking surface 01/04/16 the deep wounds on the right leg generally look better. Both of them are debrided further surface slough. The area on the lateral right leg was not debrided. She is approved for Apligraf I think I'll probably order this either next week or the week after depending on the surface of the wounds superiorly. We have been using Iodoflex which will continue until then 01/11/16; the deep wounds on the right leg again have a surface slough that requires debridement. I've not been able to get the wound bed on either one of these wounds down to what would be acceptable for an advanced skin stab- like Apligraf. The area on the medial leg has been improving. We have  been using hot Iodoflex to all wounds which seems to do the best at at least limiting the nonviable surface 01/24/17; we have continued Iodoflex and all her wound areas. Her debridement Gen. he is easier and the surface underneath this looks viable. Nevertheless these are large area wounds with  exposed muscle at least on the anterior parts. We have ordered Apligraf's for 2 weeks from now. The patient will be away next week 02/07/17; the patient was close to have first Apligraf today however we did not order one. I therefore replaced her Iodoflex. She essentially has 3 large punched-out areas on her right anterior leg and right medial ankle. 02/11/17; Apligraf #1 02/25/17; Apligraf #2. In general some improvement in the right medial ankle and right anterior leg wounds. The larger wound medially perhaps some better 03/11/17; Apligraf #3. In general continued improvement in the right medial ankle and the right anterior leg wounds. 03/25/17 Apligraf #4. In general continued improvement especially on the right medial ankle and the lower 04/08/17; Apligraf #5 in general continued improvement in all wound sites. 04/22/17; post Apligraf #5 her wound beds continued to look a lot better all of them up to the surface of the surrounding skin. Had a caramel-colored slough that I did not debrided in case there is residual Apligraf effect. The wounds are as good as I have seen these looking quite some time. 04/29/17; we applied [place] and last week after completing her Apligraf. Wounds look as though they've contracted somewhat although they have a nonviable surface which was problematic in the past. Apparently she has been approved for further Apligraf's. We applied Iodosorb today after debridement. 05/06/17; we're fortunate enough today to be able to apply additional Apligraf approved by her insurance. In general all of her wounds look better Apligraf #6 05/24/17; Apligraf #7 continued improvement in all wounds o3 06/10/17 Apligraf #8. Continued improvement in the surface of all wounds. Not much of an improvement in dimensions as I might a follow 06/24/17 Apligraf #9 continued general improvement although not as much change in the wound areas I might of like. She has a new open area on the right  anterior lateral ankle very small and superficial. She also has a necrotic wound on the tip of her right index finger probably secondary to severe uncontrolled Raynaud's phenomenon. She is already on sildenafil and already seen her rheumatologist who gave her Keflex. 07/08/17; Apligraf #10. Generalized improvement although she has a small additional wound just medial to the major wound area. 07/22/17; after discussion we decided not to reorder any further Apligraf's although there is been considerable improvement with these it hasn't been recently. The major wound anteriorly looks better. Smaller wounds beneath this and the more recent one and laterally look about the same. The area on the right lateral lower leg looks about the same 07/29/17 this is a patient who is exceedingly complex. She has advanced scleroderma, crest syndrome including calcinosis, PAD status post revascularization, chronic venous insufficiency status post ablations. She initially presented to this clinic with wounds on her bilateral lower legs however these closed. More recently we have been dealing with a large open area superiorly on the right anterior leg, a smaller wound underneath this and then another one on the right medial lower extremity. These improve significantly with 10 Apligraf applications. Over the last 2-3 weeks we are making good progress with Hydrofera Blue and these seem to be making progress towards closure 08/19/17; wounds continued to make good improvement with Hydrofera  Blue and episodic aggressive debridements 08/26/17; still using Hydrofera Blue. Good improvements 09/09/17; using Hydrofera Blue continued improvement. Area on the lateral part of her right leg has only a small remaining open area. The small inferior satellite region is for all intents and purposes closed. Her major wound also is come in in terms of depth and has advancing epithelialization. 09/16/17; using Hydrofera Blue with continued  improvement. The smaller satellite wound. We've closed out today along with a new were satellite wound medially. The area on her medial ankle is still open and her major wound is still open but making improvement. All using Hydrofera Blue. Currently 09/30/17; using Hydrofera Blue. Still a small open area on the lateral right ankle area and her original major wound seems to be making gradual and steady improvement. 10/14/17; still using Hydrofera Blue. Still too small open areas on the right lateral ankle. Her original major wound is horizontal and linear. The most problematic area paradoxically seems to be the area to the medial area wears I thought it would be the lateral. 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 10/28/17; still using Hydrofera Blue. Right lateral ankle has a very small open area superiorly on the most lateral part of the wound. Her original open wound has 2 open areas now separated by normal skin and we've redefined this. 11/11/17; still using Hydrofera Blue area and right lateral ankle continues to have a small open area on mostly the lateral part of the wound. Her original wound has 2 small open areas now separated by a considerable amount of normal skin 11/28/17; the patient called in slightly before Thanksgiving to report pain and erythema above the wound on the right leg. In the past this is responded well to treatment for cellulitis and I gave her over the phone doxycycline. She stated this resulted in fairly abrupt improvement. We have been using Hydrofera Blue for a prolonged period of time to the larger wound anteriorly into the remaining wound on her right right lateral ankle. The latter is just about closed with only a small linear area and the bottom of the [place]. 12/02/17; use endoform and left the dressing on since last visit. There is no tenderness and no evidence of infection. 12/16/17; patient has been using  Endoform but not making much progress. The 2 punched out open areas anteriorly which were the reminiscence of her major wound appear deeper. The area on the lateral aspect of her right calf also appears deeper. Also she has a puzzling tender swelling above her wound on the right leg. This seems larger than last time. Just above her wounds there appears to be some fluctuance in this area it is not erythematous and there is no crepitus 12/30/17; patient has been using Endoform up until last week we used Hydrofera Blue. Ultrasound of the swelling above her 2 major wounds last time was negative for a fluid collection. I gave her cefaclor for the erythema and tenderness in this area which seems better. Unfortunately both punched out areas anteriorly and the area on her right medial lower leg appear deeper. In fact the lateral of the wounds anteriorly actually looks as though it has exposed tendon and/or muscle sheath. She is not systemically unwell. She is complaining of vaginitis type symptoms presumably Candida from her antibiotics. 01/06/18; we're using santyl. she has 2 punched out areas anteriorly which were initially part of a large wound. Unfortunately medially this is now open to  tendon/muscle. All the wounds have the same adherent very difficult to debride surface. 01/20/18; 2 week follow-up using Santyl. She has the 2 punched out areas anteriorly which were initially part of her large surface wound there. Medially this still has exposed muscle. All of these have the same tightly adherent necrotic surface which requires debridement. PuraPly was not accepted by the patient's insurance however her insurance I think it changed therefore we are going to run Apligraf to gain 02/03/18; the patient has been using Santyl. The wound on the right lateral ankle looks improved and the 2 areas anteriorly on the right leg looks about the same. The medial one has exposed muscle. The lateral  1 requiresdebridement. We use PuraPly today for the first week 02/10/18; PuraPly #2. The patient has 3 wounds. The area on the right lateral ankle, 2 areas anteriorly that were part of her original large wound in this area the medial one has exposed muscle. All of the wounds were lightly debrided with a number 3 curet. PuraPly #2 applied the lateral wound on the calf and the right lateral ankle look better. 02/17/18; PuraPly #3. Patient has 3 wounds. The area on the right lateral ankle in 2 areas internally that were part of her original large wound. The lateral area has exposed muscle. She arrived with some complaints of pain around the right ankle. 02/24/18; PuraPly #4; not much change in any of the 3 wound areas. Right lateral ankle, right lateral calf. Both of these required debridement with a #3 curet. She tolerates this marginally. The area on the medial leg still has exposed muscle. Not much change in dimensions 03/03/18 PuraPly #5. The area on the medial ankle actually looks better however the 2 separate areas that were original parts of the larger right anterior leg wound look as though they're attempting to coalesce. 03/10/18; PuraPly #6. The area on the medial ankle actually continues to look and measures smaller however the 2 separate areas that were part of the original large wound on the right anterior leg have now coalesced. There hasn't been much improvement here. The lateral area actually has underlying exposed muscle 03/17/18-she is here in follow-up evaluation for ulcerations to the right lower extremity. She is voicing no complaints or concerns. She tolerated debridement. Puraply#7 placed 03/24/18; difficult right lower extremity ulcerations. PuraPly #8 place. She is been approved for [employer]. She did very well with Apligraf today however she is apparently reached her "lifetime max" 03/31/18; marginal improvement with PuraPly although her wounds looked as good as they have in several  weeks today. Used TheraSkins #1 04/14/18 TheraSkin #2 today 04/28/18 TheraSkins #3. Wound slightly improved 05/12/18; TheraSkin skin #4. Wound response has been variable. 05/27/18 TheraSkin #5. Generally improvement in all wound areas. I've also put her in 3 layer compression to help with the severe venous hypertension 06/09/18; patient is done quite well with the TheraSkins unfortunately we have no further applications. I also put her in 3 layer compression last week and that really seems to of helped. 06/16/18; we have been using silver collagen. Wounds are smaller. Still be open area to the muscle layer of her calf however even that is contracted somewhat. She tells me that at night sometimes she has pain on the right lateral calf at the site of her lower wound. Notable that I put her into 3 layer compression about 3 weeks ago. She states that she dangles her leg over the bed that makes it feel better but she does not  describe claudication during the day ooShe is going to call her secondary insurance to see if they will continue to cover advanced treatment products I have reviewed her arterial studies from 01/22/17; this showed an ABI in the right of 1 and on the left noncompressible. TBI on the right at 0.30 on the left at 0.34. It is therefore possible she has significant PAD with medial calcification falsely elevating her ABI into the normal range. I'll need to be careful about asking her about this next week it's possible the 3 layer compression is too much 06/23/18; was able to reapply TheraSkin 1 today. Edema control is good and she is not complaining of pain no claudication 07/07/18;no major change. New wound which was apparently a taper removal injury today in our clinic between her 2 wounds on the right calf TheraSkins #2 07/14/18; I think there is some improvement in the right lateral ankle and the medial part of her wound. There is still exposed muscle medially. 07/28/18; two-week  follow-up. TheraSkins#3. Unfortunately no major change. She is not a candidate I don't think for skin grafting due to severe venous hypertension associated with her scleroderma and pulmonary hypertension 08/11/18 Patient is here today for her [employer] application #17 (#5 of the second set). She seems to be doing well and in the base of the wound appears to show some progress at this point. This is the last approved [employer]askin of the second set. 08/25/18; she has completed TheraSkin. There has been some improvement on the right lateral calf wound as well as the anterior leg wounds. The open area to muscle medially on the anterior leg wound is smaller. I'm going to transition her back to [facility] under Kerlix Coban change every second day. She reports that she had some calcification removed from her right upper arm. We have had previous problems with calcifications in her wounds on her legs but that has not happened recently 09/08/18;using Hydrofera Blue on both her wound areas. Wounds seem to of contracted nicely. She uses Kerlix Coban wrap and changes at home herself 09/22/2018; using Hydrofera Blue on both her wound areas. Dimensions seem to have come down somewhat. There is certainly less depth in the medial part of the mid tibia wound and I do not think there is any exposed muscle at this point. 10/06/2018; 2-week follow-up. Using [facility] on her wound areas. Dimensions have come down nicely both on the right lateral ankle area in the right mid tibial area. She has no new complaints 10/20/2018; 2-week follow-up. She is using Hydrofera Blue. Not much change from the last time she was here. The area on the lateral ankle has less depth although it has raised edges on one side. I attempted to remove as much of the raised edge as I could without creating more additional wounds. The area on the right anterior mid tibia area looks the same. 11/03/2018; 2-week follow-up she is using Hydrofera  Blue. On the right anterior leg she now has 2 wounds separated by a large area of normal skin. The area on the medial part still has I think exposed muscle although this area itself is a lot smaller. The area laterally has some depth. Both areas with necrotic debris. ooThe area on her right lateral ankle has come in nicely 11/17/2018; patient continues to use Hydrofera Blue. We have been increasing separation of the 2 wounds anteriorly which were at one point joint the area on the right lateral calf continues to have I think  some improvement in depth. 12/08/2018; patient continues to use Hydrofera Blue. There is some improvement in the area on the right lateral calf. The 2 areas that were initially part of the original large wound in the mid right tibia are probably about the same. In fact the medial area is probably somewhat larger. We will run puraply through the patient's insurance 12/22/2018; she has been using Hydrofera Blue. We have small wounds on the right lateral calf and 2 small areas that were initially part of a large wound in the right mid tibia. We applied pure apply #1 today. 12/29/2018; we applied puraply #2. Her wounds look somewhat better especially on the right lateral calf and the lateral part of her original wound in the mid tibial area. 01/05/2019; perhaps slightly improved in terms of wound bed condition but certainly not as much improvement as I might of liked. Puraply #3 1/13: we did not have a correct sized puraply to apply. wounds more pinched out looking, I increased her compression to 3 layer last week to help with significant multilevel venous hypertension. Since then I've reviewed her arterial status. She has a right femoral endarterectomy and a distal left SFA stent. She was being followed by Dr. [name] for a period however she does not appear to have seen him in 3 years. I will set up an appointment. 1/20. The patient has an additional wound on the right lateral calf  between the distal wound and proximal wounds. We did not have Puraply last week. Still does not have a follow-up with Dr. early 1/27: Follow-up with Dr. early has been arranged apparently with follow-up noninvasive studies. Wounds are measuring roughly the same although they certainly look smaller 2/3; the patient had non invasive studies. Her ABIs on the right were 0.83 and on the left 1.02 however there was no great toe pressure bilaterally. Also worrisome monophasic waveforms at the PTA and dorsalis pedis. We are still using Puraply. We have had some improvement in all of the wounds especially the lateral part of the mid tibial area. 2/10; sees Dr. early of vein and vascular re-arterial studies next week. Puraply reapplied today. 2/17; Dr. early of vein and vascular his appointment is tomorrow. Puraply reapplied after debridement of all wounds 2/24; the patient saw Dr. early I reviewed his note. sHe noted the previous right femoral endarterectomy with a Dacron patch. He also noted the normal ABI and the monophasic waveforms suggesting tibial disease. Overall he did not feel that she had any evidence of arterial insufficiency that would impair her wound healing. He did note her venous disease as well. He suggested PRN follow-up. 3/2; I had the last puraply applied today. The original wounds over the mid tibia area are improved where is the area on the right lower leg is not 3/9; wounds are smaller especially in the right mid tibia perhaps slightly in the right lateral calf. We finished with puraply and went to endoform today 3/23; the patient arrives after 2 weeks. She has been using endoform. I think all of her wounds look slightly better which includes the area on the right lateral calf just above the right lateral malleolus and the 2 in the right mid tibia which were initially part of the same wound. 4/27; TELEHEALTH visit; the patient was seen for telehealth visit today with her consent in  the middle of the worldwide epidemic. Since she was last here she called in for antibiotics with pain and tenderness around the area on the right  medial ankle. I gave her empiric doxycycline. She states this feels better. She is using endoform on both of these areas 5/11 TELEHEALTH; the patient was seen for telehealth visit today. She was accompanied at home by her husband. She has severe pulmonary hypertension accompanied scleroderma and in the face of the Covid epidemic cannot be safely brought into our clinic. We have been using endoform on her wound areas. There are essentially 3 wound areas now in the left mid tibia now 2 open areas that it one point were connected and one on the right lateral ankle just above the malleolus. 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 She cannot come to clinic. The wounds were reviewed today via telehealth. She has severe chronic venous hypertension which I think is centrally mediated. She has wounds on her right anterior tibia and right lateral ankle area. These are chronic. She has been using endoform. 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 We have been following her from telehealth. She has severe chronic venous hypertension which may be mostly centrally mediated secondary to right heart heart failure. She has wounds on her right anterior tibia and right lateral ankle these are chronic we have been using endoform 6/22; TELEHEALTH; this patient was seen today via telehealth. 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 Since the last time we had contact with  this patient she developed some pain and erythema around the wound on her right lateral malleolus/ankle and we put in antibiotics for her. This is resulted in good improvement with resolution of the erythema and tenderness. I changed her to silver alginate last time. We had been using endoform for an extended period of time 7/13; TELEHEALTH; this patient was seen today via telehealth. 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 She would continue to be at a prohibitive risk to be brought into our clinic unless this was absolutely necessary. These visits have been done with her approval as well as her husband. We have been using silver alginate to the areas on the right mid tibia and right lateral lower leg. 7/27 TELEHEALTH; patient was seen along with her husband today via telehealth. 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 We changed her to sample last visit. She has 2 areas a chronic wound on her right mid tibia and one just above her ankle. These were not the original wounds when she came into this clinic but she developed them during treatment 8/17; she comes in for her first face-to-face visit in a long period. She has a remaining area just medial to the right tibia which is the last open part of her large wound across this area. She also has an area on the right lateral lower leg. We prescribed Santyl last telehealth visit but they were concerned that this was making a deeper so they put silver alginate on it last week. Her husband changes the dressings. 8/31; using Santyl to the 2 wound areas some improvement in wound surfaces. Husband has surgery in 2 weeks we will  put her out 3 weeks. Any of the advanced treatment options that I can think of that would be eligible for this wound would also cause her to have to come in weekly. The risk that the patient is just  too high 9/21. Using Santyl to the 2 wound areas. Both of these are somewhat better although the medial mid tibia area still has exposed muscle. Lateral ankle requiring debridement. Using Santyl 10/12; using Santyl to the 2 wound areas. One on the right lateral ankle and the other in the medial calf which still has exposed muscle. Both areas have come down in size and have better looking surfaces. She has made nice progress with santyl 11/2; we have been using Santyl to the 2 wound areas. Right lateral ankle and the other in the mid tibia area the medial part of this still has exposed muscle. 11/23/2019 on evaluation today patient appears to be doing about the same really with regard to her wounds. She is actually not very pleased with how things seem to be progressing at this point she tells me that she really has not noted much improvement unfortunately. With that being said there is no signs of active infection at this time. There is some slough buildup noted at this time which again along with some dry skin around the edges of the wound I think would benefit her to try to debride some of this away. Fortunately her pain is doing fairly well. She still has exposed muscle in the right medial/tibial area. 12/14; TELEHEALTH; she was changed to [facility] to the right calf wound and right lateral ankle wound when she was here last time. Unfortunately since then she had a fall with a pelvic fracture and a fracture of her wrist. She was apparently hospitalized for 5 days. I have not looked at her discharge summary. She apparently came out of the hospital with a blister on her right heel. She was seen today via telehealth by myself and our case manager. The patient and her husband were present. She has been using Hydrofera Blue at our direction from the last time she was in the clinic. There is been no major improvement in fact the areas appear deeper and with a less viable surface 01/04/2020;  TELEHEALTH; the patient was seen today in accompaniment of her husband and our nurse. She has 3 open areas 1 on the right medial mid tibia, one on the right lateral ankle and a large eschared pressure area on the right heel. We have been using Iodoflex to the 2 original wounds. 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 It is simply too perilous for her to be seen in any other way Allergies Sulfa (Sulfonamide Antibiotics) (Severity: Severe, Reaction: rash), Levaquin (Severity: Severe, Reaction: flu like symptoms), penicillin (Severity: Severe, Reaction: rash) Objective Constitutional Sitting or standing Blood Pressure is within target range for patient.. Pulse regular and within target range for patient.Marland Kitchen Respirations regular, non-labored and within target range.. Temperature is normal and within the target range for the patient.Marland Kitchen Appears in no distress. Vitals Time Taken: 9:15 AM, Height: 68 in, Weight: 132 lbs, BMI: 20.1, Temperature: 97.4 F, Pulse: 67 bpm, Blood Pressure: 124/55 mmHg. General Notes: vitals obtained by patient General Notes: Wound exam; the area on the mid tibia has exposed muscle and looks about the same. ooRight lateral ankle some surrounding erythema but this is nontender. The wound itself looks somewhat better. ooLarge eschared black area over the right heel which is  an unstageable pressure ulcer. Integumentary (Hair, Skin) Wound #16 status is Open. Original cause of wound was Pressure Injury. The wound is located on the Right Calcaneus. The wound measures 4cm length x 4cm width x 0.1cm depth; 12.566cm^2 area and 1.257cm^3 volume. There is no tunneling or undermining noted. There is a none present amount of drainage noted. The wound margin is flat and intact. There is no granulation within the wound bed. There is a large (67-100%) amount of necrotic tissue within the wound bed including Eschar. Wound #5 status is Open. Original  cause of wound was Gradually Appeared. The wound is located on the Right,Medial Lower Leg. The wound measures 1.2cm length x 0.9cm width x 0.9cm depth; 0.848cm^2 area and 0.763cm^3 volume. There is muscle and Fat Layer (Subcutaneous Tissue) Exposed exposed. There is no tunneling or undermining noted. There is a medium amount of purulent drainage noted. The wound margin is well defined and not attached to the wound base. There is no granulation within the wound bed. There is a large (67-100%) amount of necrotic tissue within the wound bed including Adherent Slough. Wound #7 status is Open. Original cause of wound was Gradually Appeared. The wound is located on the Right,Lateral Malleolus. The wound measures 1.2cm length x 0.6cm width x 0.3cm depth; 0.565cm^2 area and 0.17cm^3 volume. There is no tunneling or undermining noted. There is a medium amount of purulent drainage noted. The wound margin is well defined and not attached to the wound base. There is no granulation within the wound bed. There is a large (67-100%) amount of necrotic tissue within the wound bed including Adherent Slough. 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 - Televisit Dressing Change Frequency: Wound #5 Right,Medial Lower Leg: Change Dressing every other day. Wound #7 Right,Lateral Malleolus: Change Dressing every other day. Wound #16 Right Calcaneus: Change dressing every day. Skin Barriers/Peri-Wound Care: Moisturizing lotion - both legs Wound Cleansing: May shower and wash wound with  soap and water. Primary Wound Dressing: Wound #5 Right,Medial Lower Leg: Iodoflex - may use Iodosorb gel if Iodoflex not available Wound #7 Right,Lateral Malleolus: Iodoflex - may use Iodosorb gel if Iodoflex not available Wound #16 Right Calcaneus: Other: - Paint with Betadine Secondary Dressing: Wound #5 Right,Medial Lower Leg: Dry Gauze Wound #7 Right,Lateral Malleolus: Dry Gauze Wound #16 Right Calcaneus: Heel Cup Edema Control: Kerlix and Coban - Right Lower Extremity Avoid standing for long periods of time Elevate legs to the level of the heart or above for 30 minutes daily and/or when sitting, a frequency of: - throughout the day Off-Loading: Turn and reposition every 2 hours Other: - float heels off of bed/chair with pillow under calves Home Health: [facility] for [facility]. - [facility]. I am continuing with the Iodoflex to the 2 original wounds on the right mid tibia and the right lateral ankle 2. I have changed obtaining with Betadine to the right heel. At some point this is going to need to be debrided for now simply used Betadine to dry the area up and cause some contraction. Electronic Signature(s) Signed: 01/04/2020 4:56:38 PM By: Linton Ham MD  Entered By: Linton Ham on 01/04/2020 13:17:03 -------------------------------------------------------------------------------- SuperBill Details Patient Name: Date of Service: MINH, JASPER 01/04/2020 Medical Record Number:[id number] Patient Account Number: [id number] Date of Birth/Sex: Treating RN: 08-Oct-1951 (69 y.o. 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

## 2020-01-04 NOTE — Telephone Encounter (Signed)
Patient called her nose has been stuffed up for a week now nothing OTC is helping, she has tried vick's and it did not help. She said her temperature is normal 97.7. No other symptoms.

## 2020-01-04 NOTE — Progress Notes (Signed)
[name], MORNING ([id number]) Visit Report for 01/04/2020 Allergy List Details Patient Name: Date of Service: [name] [name], [name] [name] 01/04/2020 9:15 AM Medical Record Number:[id number] Patient Account Number: [id number] Date of Birth/Sex: Treating RN: Sep 14, 1951 (69 y.o. [name] Primary Care Jessamy Torosyan: [name] Other Clinician: Referring Suesan Mohrmann: Treating Willey Due/Extender:Robson, Leotis Shames, MARY Weeks in Treatment: [phone] Allergies Active Allergies Sulfa (Sulfonamide Antibiotics) Reaction: rash Severity: Severe Levaquin Reaction: flu like symptoms Severity: Severe penicillin Reaction: rash Severity: Severe Allergy Notes Electronic Signature(s) Signed: 01/04/2020 5:01:35 PM By: Levan Hurst RN, BSN Entered By: Levan Hurst on 01/04/2020 09:49:08 -------------------------------------------------------------------------------- Wound Assessment Details Patient Name: Date of Service: [name] [name] 01/04/2020 9:15 AM Medical Record Number:[id number] Patient Account Number: [id number] Date of Birth/Sex: Treating RN: 09-18-1951 (69 y.o. [name] Primary Care Lysha Schrade: [name] Other Clinician: Referring Babe Clenney: Treating Mykiah Schmuck/Extender:Robson, Leotis Shames, MARY Weeks in Treatment: 406 Wound Status Wound Number: 16 Primary Pressure Ulcer Etiology: Wound Location: Right Calcaneus Wound Open Wounding Event: Pressure Injury Wounding Event: Pressure Injury Status: Date Acquired: 01/04/2020 Comorbid Anemia, Hypertension, Peripheral Arterial Weeks Of Treatment: 0 History: Disease, Peripheral Venous Disease, Clustered Wound: No Raynauds, Scleroderma, Rheumatoid Arthritis, Osteoarthritis Wound Measurements Length: (cm) 4 Width: (cm) 4 Depth: (cm) 0.1 Area: (cm) 12.566 Volume: (cm) 1.257 Wound Description Classification: Unstageable/Unclassified Wound Margin: Flat and Intact Exudate Amount: None Present Wound Bed Granulation Amount: None  Present (0%) Necrotic Amount: Large (67-100%) Necrotic Quality: Eschar r After Cleansing: No ibrino No Exposed Structure ed: No ubcutaneous Tissue) Exposed: No ed: No ed: No d: No : No % Reduction in Area: % Reduction in Volume: Epithelialization: None Tunneling: No Undermining: No Foul Odo Slough/F Fascia Expos Fat Layer (S Tendon Expos Muscle Expos Joint Expose Bone Exposed Electronic Signature(s) Signed: 01/04/2020 5:01:35 PM By: Levan Hurst RN, BSN Entered By: Levan Hurst on 01/04/2020 09:33:59 -------------------------------------------------------------------------------- Wound Assessment Details Patient Name: Date of Service: [name] [name] 01/04/2020 9:15 AM Medical Record Number:[id number] Patient Account Number: [id number] Date of Birth/Sex: Treating RN: 07/19/1951 (69 y.o. [name] Primary Care [name]: [name] Other Clinician: Referring [name]: Treating [name]/Extender:Robson, Leotis Shames, MARY Weeks in Treatment: 406 Wound Status Wound Number: 5 Primary Venous Leg Ulcer Etiology: Wound Location: Right Lower Leg - Medial Wound Open Wounding Event: Gradually Appeared Status: Date Acquired: 01/13/2013 Comorbid Anemia, Hypertension, Peripheral Arterial Weeks Of Treatment: 362 History: Disease, Peripheral Venous Disease, Clustered Wound: Yes Raynauds, Scleroderma, Rheumatoid Arthritis, Osteoarthritis Wound Measurements Length: (cm) 1.2 % Reduction Width: (cm) 0.9 % Reduction Depth: (cm) 0.9 Epitheliali Area: (cm) 0.848 Tunneling: Volume: (cm) 0.763 Underminin Wound Description Full Thickness With Exposed Support Foul Odor A Classification: Structures Slough/Fibr Wound Well defined, not attached Margin: Exudate Medium Amount: Exudate Type: Purulent Exudate yellow, brown, green Color: Wound Bed Granulation Amount: None Present (0%) Necrotic Amount: Large (67-100%) Fascia Expo Necrotic Quality: Adherent Slough  Fat Layer ( Tendon Expo Muscle Expo Necros Joint Expos Bone Expose fter Cleansing: No ino Yes Exposed Structure sed: No Subcutaneous Tissue) Exposed: Yes sed: No sed: Yes is of Muscle: No ed: No d: No in Area: 40% in Volume: -441.1% zation: Small (1-33%) No g: No Electronic Signature(s) Signed: 01/04/2020 5:01:35 PM By: Levan Hurst RN, BSN Entered By: Levan Hurst on 01/04/2020 09:31:05 -------------------------------------------------------------------------------- Wound Assessment Details Patient Name: Date of Service: [name] [name] 01/04/2020 9:15 AM Medical Record Number:[id number] Patient Account Number: [id number] Date of Birth/Sex: Treating RN: 01-Jan-1951 (69 y.o. [name] Primary Care [name]: [name] Other Clinician: Referring Senica Crall: Treating Mariaeduarda Defranco/Extender:Robson, Leotis Shames,  MARY Weeks in Treatment: 406 Wound Status Wound Number: 7 Primary Venous Leg Ulcer Etiology: Wound Location: Right Malleolus - Lateral Wound Open Wounding Event: Gradually Appeared Status: Date Acquired: 06/25/2016 Comorbid Anemia, Hypertension, Peripheral Arterial Weeks Of Treatment: 182 History: Disease, Peripheral Venous Disease, Clustered Wound: No Raynauds, Scleroderma, Rheumatoid Arthritis, Osteoarthritis Wound Measurements Length: (cm) 1.2 % Reduction Width: (cm) 0.6 % Reduction Depth: (cm) 0.3 Epitheli Area: (cm) 0.565 Tunneli Volume: (cm) 0.17 Undermi Wound Description Full Thickness Without Exposed Support Foul Odo Classification: Structures Slough/F Wound Well defined, not attached Margin: Exudate Medium Amount: Exudate Purulent Type: Exudate yellow, brown, green Color: Wound Bed Granulation Amount: None Present (0%) Necrotic Amount: Large (67-100%) Fascia E Necrotic Quality: Adherent Slough Fat Laye Tendon E Muscle E Joint Ex Bone Exp r After Cleansing: No ibrino Yes Exposed Structure xposed: No r (Subcutaneous  Tissue) Exposed: No xposed: No xposed: No posed: No osed: No in Area: 77.9% in Volume: 33.3% alization: None ng: No ning: No Electronic Signature(s) Signed: 01/04/2020 5:01:35 PM By: Levan Hurst RN, BSN Entered By: Levan Hurst on 01/04/2020 09:31:29 -------------------------------------------------------------------------------- [place] Details Patient Name: Date of Service: [name] [name] 01/04/2020 9:15 AM Medical Record Number:[id number] Patient Account Number: [id number] Date of Birth/Sex: Treating RN: Aug 01, 1951 (69 y.o. [name] Primary Care Marlee Armenteros: [name] Other Clinician: Referring Abdullah Rizzi: Treating Pierce Barocio/Extender:Robson, Leotis Shames, MARY Weeks in Treatment: 406 Vital Signs Time Taken: 09:15 Temperature (F): 97.4 Height (in): 68 Pulse (bpm): 67 Weight (lbs): 132 Blood Pressure (mmHg): 124/55 Body Mass Index (BMI): 20.1 Reference Range: 80 - 120 mg / dl Notes vitals obtained by patient Electronic Signature(s) Signed: 01/04/2020 5:01:35 PM By: Levan Hurst RN, BSN Entered By: Levan Hurst on 01/04/2020 [place]

## 2020-01-05 ENCOUNTER — Ambulatory Visit: Payer: Medicare Other | Attending: Internal Medicine

## 2020-01-05 DIAGNOSIS — G629 Polyneuropathy, unspecified: Secondary | ICD-10-CM | POA: Diagnosis not present

## 2020-01-05 DIAGNOSIS — M349 Systemic sclerosis, unspecified: Secondary | ICD-10-CM | POA: Diagnosis not present

## 2020-01-05 DIAGNOSIS — S62102D Fracture of unspecified carpal bone, left wrist, subsequent encounter for fracture with routine healing: Secondary | ICD-10-CM | POA: Diagnosis not present

## 2020-01-05 DIAGNOSIS — Z20822 Contact with and (suspected) exposure to covid-19: Secondary | ICD-10-CM | POA: Diagnosis not present

## 2020-01-05 DIAGNOSIS — I739 Peripheral vascular disease, unspecified: Secondary | ICD-10-CM | POA: Diagnosis not present

## 2020-01-05 DIAGNOSIS — S52592D Other fractures of lower end of left radius, subsequent encounter for closed fracture with routine healing: Secondary | ICD-10-CM | POA: Diagnosis not present

## 2020-01-05 DIAGNOSIS — S32592D Other specified fracture of left pubis, subsequent encounter for fracture with routine healing: Secondary | ICD-10-CM | POA: Diagnosis not present

## 2020-01-07 DIAGNOSIS — S62102D Fracture of unspecified carpal bone, left wrist, subsequent encounter for fracture with routine healing: Secondary | ICD-10-CM | POA: Diagnosis not present

## 2020-01-07 DIAGNOSIS — N183 Chronic kidney disease, stage 3 unspecified: Secondary | ICD-10-CM | POA: Diagnosis not present

## 2020-01-07 DIAGNOSIS — G629 Polyneuropathy, unspecified: Secondary | ICD-10-CM | POA: Diagnosis not present

## 2020-01-07 DIAGNOSIS — S32592D Other specified fracture of left pubis, subsequent encounter for fracture with routine healing: Secondary | ICD-10-CM | POA: Diagnosis not present

## 2020-01-07 DIAGNOSIS — M349 Systemic sclerosis, unspecified: Secondary | ICD-10-CM | POA: Diagnosis not present

## 2020-01-07 DIAGNOSIS — S52592D Other fractures of lower end of left radius, subsequent encounter for closed fracture with routine healing: Secondary | ICD-10-CM | POA: Diagnosis not present

## 2020-01-07 DIAGNOSIS — I739 Peripheral vascular disease, unspecified: Secondary | ICD-10-CM | POA: Diagnosis not present

## 2020-01-07 DIAGNOSIS — I2510 Atherosclerotic heart disease of native coronary artery without angina pectoris: Secondary | ICD-10-CM | POA: Diagnosis not present

## 2020-01-07 LAB — NOVEL CORONAVIRUS, NAA: SARS-CoV-2, NAA: NOT DETECTED

## 2020-01-11 ENCOUNTER — Other Ambulatory Visit: Payer: Self-pay

## 2020-01-11 ENCOUNTER — Encounter: Payer: Self-pay | Admitting: Internal Medicine

## 2020-01-11 ENCOUNTER — Ambulatory Visit (INDEPENDENT_AMBULATORY_CARE_PROVIDER_SITE_OTHER): Payer: Medicare Other | Admitting: Internal Medicine

## 2020-01-11 VITALS — BP 110/50 | Temp 98.0°F | Ht 67.5 in

## 2020-01-11 DIAGNOSIS — Z8572 Personal history of non-Hodgkin lymphomas: Secondary | ICD-10-CM

## 2020-01-11 DIAGNOSIS — E039 Hypothyroidism, unspecified: Secondary | ICD-10-CM

## 2020-01-11 DIAGNOSIS — R790 Abnormal level of blood mineral: Secondary | ICD-10-CM | POA: Diagnosis not present

## 2020-01-11 DIAGNOSIS — M810 Age-related osteoporosis without current pathological fracture: Secondary | ICD-10-CM | POA: Diagnosis not present

## 2020-01-11 DIAGNOSIS — E782 Mixed hyperlipidemia: Secondary | ICD-10-CM | POA: Diagnosis not present

## 2020-01-11 DIAGNOSIS — M349 Systemic sclerosis, unspecified: Secondary | ICD-10-CM | POA: Diagnosis not present

## 2020-01-11 DIAGNOSIS — I10 Essential (primary) hypertension: Secondary | ICD-10-CM | POA: Diagnosis not present

## 2020-01-11 DIAGNOSIS — Z9981 Dependence on supplemental oxygen: Secondary | ICD-10-CM | POA: Diagnosis not present

## 2020-01-11 DIAGNOSIS — R8290 Unspecified abnormal findings in urine: Secondary | ICD-10-CM

## 2020-01-11 DIAGNOSIS — Z0000 Encounter for general adult medical examination without abnormal findings: Secondary | ICD-10-CM | POA: Diagnosis not present

## 2020-01-11 DIAGNOSIS — D649 Anemia, unspecified: Secondary | ICD-10-CM

## 2020-01-11 DIAGNOSIS — S3282XD Multiple fractures of pelvis without disruption of pelvic ring, subsequent encounter for fracture with routine healing: Secondary | ICD-10-CM | POA: Diagnosis not present

## 2020-01-11 DIAGNOSIS — D509 Iron deficiency anemia, unspecified: Secondary | ICD-10-CM | POA: Diagnosis not present

## 2020-01-11 MED ORDER — DENOSUMAB 60 MG/ML ~~LOC~~ SOSY
60.0000 mg | PREFILLED_SYRINGE | Freq: Once | SUBCUTANEOUS | Status: AC
  Administered 2020-01-11: 60 mg via SUBCUTANEOUS

## 2020-01-11 NOTE — Progress Notes (Signed)
Subjective:    [name], female    DOB: 11/28/1951, 69 y.o.   MRN: [id number]  HPI  69 year old Female for health maintenance exam, Medicare wellness and evaluation of medical issues.   Patient suffered a fall occurring in November 2020.  She sustained a left distal radial fracture requiring open reduction internal fixation by Dr. Marcelino Scot on November 24.  She also suffered fractures of pelvis involving left superior and inferior pubic rami.  She has chronic pulmonary hypertension and chronic respiratory failure requiring home oxygen.  History of scleroderma.  History of hypothyroidism.  Has been hospitalized November 10 with persistent diarrhea volume depletion hypocalcemia hypokalemia and hypomagnesemia.  Patient collapsed at home.  She had developed vomiting and diarrhea 2 days prior to admission and was given IV fluids in the emergency department on November 8.  CT of the abdomen at that time showed no obstruction.  Cholelithiasis present but no UTI.  Urinalysis is abnormal and culture reveals a Klebsiella UTI.  Will be treated with Cipro for 7 days  Continues to receive home health services.  In October 2020 had amputation digit right ring and small finger by Dr. Fredna Dow including sympathectomy right radial ulnar arch.  This has been followed to heal.  History of low magnesium and it has been difficult to get her magnesium level back to normal.  We were finally in touch with [facility] who found something she could take.  Receives Prolia for osteoporosis.  History of hypothyroidism  Followed by Dr. Dellia Nims for wound care.  Has had chronic venous hypertension with ulcer and inflammation of the right lower extremity  History of non-Hodgkin's lymphoma treated with chemotherapy in 2010 and felt to be cured.  Was diagnosed in September 2010.  History of melanoma  Hospitalized for gastrointestinal hemorrhage May 2017.  An upper endoscopy showed possible  fungal esophagitis treated with Diflucan by Dr. Oletta Lamas.  Colonoscopy revealed some rectal polyps that were removed.  She was started on PPI.  Aspirin and Plavix were held for several days.  No obvious source of bleeding.  Had partial amputation third finger around 2007 and right index finger 2018.  Can take doxycycline capsules but not tablets  Rheumatologist Dr. Rondel Oh.  Dr. Britta Mccreedy minus cardiologist.  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  Right femoral endarterectomy by Dr. [name] in 2017.  She also saw him for extensive right leg ulceration August 2017.  Prior to that had ablation of saphenous vein.  History of venous hypertension.  History of arterial insufficiency in [place] with bilateral lower extremity stenting a number of years ago.  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  Tonsillectomy and adenoidectomy 1966.  Right fifth toe removed 1999.  History of basal cell carcinoma lower extremity.  LASEK eye surgery 2001  She is allergic to penicillin-causes a rash.  Sulfa antibiotics cause her face to peel.  Levaquin caused flulike symptoms but she can take Cipro.  Doxycycline is, swelling if she takes tablets or capsules are tolerated just fine.  Vein ligation and vein stripping May 2013 including debridement of right lower leg by Dr. [name], endovenous ablation saphenous vein with laser July 2013 involving right greater saphenous vein, endovenous ablation saphenous vein with laser left greater saphenous vein by Dr. [name] October 2013.  Sees Dr. Lamonte Sakai for pulmonary hypertension.  Diagnosed  with hypothyroidism in 2018.  History of stage III chronic kidney disease.  Chronic respiratory failure requiring oxygen in addition to interstitial lung disease.  Family history: Father with history of lupus, non-Hodgkin's lymphoma involving  thoracic area died of lymphoma at age 69.  Mother with history of hyperlipidemia died reportedly of atherosclerosis.  Daughter with history of sarcoma deceased at age 46.  64 granddaughter healthy who lives in [place].  Menarche at age 74.  Last menstrual period 2000.  Weakness of hands and numbness in her feet    Review of Systems See above    Objective:   Physical Exam Blood pressure 110/50 temperature 98 degrees pulse oximetry 96% with oxygen present BMI 21.14  Skin warm and dry.  Neck supple.  No thyromegaly.  Chest clear to auscultation.  Cardiac exam regular rate and rhythm normal S1 and S2.  Abdomen no hepatosplenomegaly masses or tenderness.  Affect is normal.  No lower extremity edema.  Breast without masses.       Assessment & Plan:  Scleroderma followed by rheumatologist  Recent pelvic fracture and distal left radius fracture secondary to a fall  Osteoporosis treated with Prolia-given dose today  Stage III chronic kidney disease  History of non-Hodgkin's lymphoma of the brain  Hypothyroidism stable on thyroid replacement  Peripheral vascular disease  History of hyperlipidemia treated with statin  Chronic anticoagulation  Hypomagnesemia-continue magnesium supplement  Severe pulmonary hypertension-oxygen dependent followed by pulmonologist  History of dry gangrene right hand  Hearing loss  History of depression  Essential hypertension stable  Abnormal urine specimen-culture sent.  Possible UTI.  Plan: Continue close follow-up with home health nurse and PT status post pelvic and distal radius fracture.  Urine specimen is abnormal.  Culture will be sent.  Needs magnesium supplement from [facility] that she can tolerate.  Iron level is within normal limits.  Would like to see every 6 months when stabilized from fall.  Subjective:   Patient presents for Medicare Annual/Subsequent preventive examination.  Review Past Medical/Family/Social: See  above   Risk Factors  Current exercise habits: Sedentary currently Dietary issues discussed: Low-fat low carbohydrate  Cardiac risk factors: Hyperlipidemia  Depression Screen  (Note: if answer to either of the following is "Yes", a more complete depression screening is indicated)   Over the past two weeks, have you felt down, depressed or hopeless? No  Over the past two weeks, have you felt little interest or pleasure in doing things? No Have you lost interest or pleasure in daily life? No Do you often feel hopeless? No Do you cry easily over simple problems? No   Activities of Daily Living  In your present state of health, do you have any difficulty performing the following activities?:   Driving? No  Managing money? No  Feeding yourself? No  Getting from bed to chair? No  Climbing a flight of stairs? No  Preparing food and eating?: No  Bathing or showering? No  Getting dressed: No  Getting to the toilet? No  Using the toilet:No  Moving around from place to place: No  In the past year have you fallen or had a near fall?:  Yes broke my wrist are you sexually active? No  Do you have more than one partner? No   Hearing Difficulties: No  Do you often ask people to speak up or repeat themselves? No  Do you experience ringing or noises in your ears? No  Do you have difficulty understanding soft or whispered voices?  No  Do you feel that you have a problem with memory? No Do you often misplace items? No    Home Safety:  Do you have a smoke alarm at your residence? Yes Do you have grab bars in the bathroom?  Yes Do you have throw rugs in your house?  None   Cognitive Testing  Alert? Yes Normal Appearance?Yes  Oriented to person? Yes Place? Yes  Time? Yes  Recall of three objects? Yes  Can perform simple calculations? Yes  Displays appropriate judgment?Yes  Can read the correct time from a watch face?Yes   List the Names of Other Physician/Practitioners you currently  use:  See referral list for the physicians patient is currently seeing.     Review of Systems: See above   Objective:     General appearance: Appears stated age  Head: Normocephalic, without obvious abnormality, atraumatic  Eyes: conj clear, EOMi PEERLA  Ears: normal TM's and external ear canals both ears  Nose: Nares normal. Septum midline. Mucosa normal. No drainage or sinus tenderness.  Throat: lips, mucosa, and tongue normal; teeth and gums normal  Neck: no adenopathy, no carotid bruit, no JVD, supple, symmetrical, trachea midline and thyroid not enlarged, symmetric, no tenderness/mass/nodules  No CVA tenderness.  Lungs: clear to auscultation bilaterally  Breasts: normal appearance, no masses or tenderness Heart: regular rate and rhythm, S1, S2 normal, no murmur, click, rub or gallop  Abdomen: soft, non-tender; bowel sounds normal; no masses, no organomegaly  Musculoskeletal: ROM normal in all joints, no crepitus, no deformity, Normal muscle strengthen. Back  is symmetric, no curvature. Skin: Skin color, texture, turgor normal. No rashes or lesions  Lymph nodes: Cervical, supraclavicular, and axillary nodes normal.  Neurologic: CN 2 -12 Normal, Normal symmetric reflexes. Normal coordination and gait  Psych: Alert & Oriented x 3, Mood appear stable.    Assessment:    Annual wellness medicare exam   Plan:    During the course of the visit the patient was educated and counseled about appropriate screening and preventive services including:   Annual flu vaccine  Covid vaccine when available     Patient Instructions (the written plan) was given to the patient.  Medicare Attestation  I have personally reviewed:  The patient's medical and social history  Their use of alcohol, tobacco or illicit drugs  Their current medications and supplements  The patient's functional ability including ADLs,fall risks, home safety risks, cognitive, and hearing and visual impairment    Diet and physical activities  Evidence for depression or mood disorders  The patient's weight, height, BMI, and visual acuity have been recorded in the chart. I have made referrals, counseling, and provided education to the patient based on review of the above and I have provided the patient with a written personalized care plan for preventive services.

## 2020-01-12 DIAGNOSIS — R8290 Unspecified abnormal findings in urine: Secondary | ICD-10-CM | POA: Diagnosis not present

## 2020-01-12 LAB — POCT URINALYSIS DIPSTICK
Bilirubin, UA: NEGATIVE
Blood, UA: NEGATIVE
Glucose, UA: NEGATIVE
Ketones, UA: NEGATIVE
Nitrite, UA: POSITIVE
Protein, UA: NEGATIVE
Spec Grav, UA: 1.015 (ref 1.010–1.025)
Urobilinogen, UA: 0.2 E.U./dL
pH, UA: 6 (ref 5.0–8.0)

## 2020-01-12 LAB — BASIC METABOLIC PANEL
BUN/Creatinine Ratio: 25 (calc) — ABNORMAL HIGH (ref 6–22)
BUN: 33 mg/dL — ABNORMAL HIGH (ref 7–25)
CO2: 27 mmol/L (ref 20–32)
Calcium: 8.9 mg/dL (ref 8.6–10.4)
Chloride: 94 mmol/L — ABNORMAL LOW (ref 98–110)
Creat: 1.3 mg/dL — ABNORMAL HIGH (ref 0.60–0.93)
Glucose, Bld: 91 mg/dL (ref 65–99)
Potassium: 4.4 mmol/L (ref 3.5–5.3)
Sodium: 134 mmol/L — ABNORMAL LOW (ref 135–146)

## 2020-01-12 LAB — IRON, TOTAL/TOTAL IRON BINDING CAP
%SAT: 19 % (calc) (ref 16–45)
Iron: 52 ug/dL (ref 45–160)
TIBC: 271 mcg/dL (calc) (ref 250–450)

## 2020-01-12 LAB — FOLATE: Folate: >24 ng/mL

## 2020-01-12 LAB — VITAMIN B12: Vitamin B-12: >2000 pg/mL — ABNORMAL HIGH (ref 200–1100)

## 2020-01-13 ENCOUNTER — Telehealth: Payer: Self-pay | Admitting: Internal Medicine

## 2020-01-13 DIAGNOSIS — G629 Polyneuropathy, unspecified: Secondary | ICD-10-CM | POA: Diagnosis not present

## 2020-01-13 DIAGNOSIS — S32592D Other specified fracture of left pubis, subsequent encounter for fracture with routine healing: Secondary | ICD-10-CM | POA: Diagnosis not present

## 2020-01-13 DIAGNOSIS — S62102D Fracture of unspecified carpal bone, left wrist, subsequent encounter for fracture with routine healing: Secondary | ICD-10-CM | POA: Diagnosis not present

## 2020-01-13 DIAGNOSIS — M349 Systemic sclerosis, unspecified: Secondary | ICD-10-CM | POA: Diagnosis not present

## 2020-01-13 DIAGNOSIS — S52592D Other fractures of lower end of left radius, subsequent encounter for closed fracture with routine healing: Secondary | ICD-10-CM | POA: Diagnosis not present

## 2020-01-13 DIAGNOSIS — I739 Peripheral vascular disease, unspecified: Secondary | ICD-10-CM | POA: Diagnosis not present

## 2020-01-13 NOTE — Telephone Encounter (Signed)
That has to come from orthopedist.

## 2020-01-13 NOTE — Telephone Encounter (Signed)
[name] [name] [phone]  Roz[place] called to say she needs something for pelvic pain and she stated she had done well on tramadol in the past.

## 2020-01-14 ENCOUNTER — Other Ambulatory Visit: Payer: Self-pay

## 2020-01-14 ENCOUNTER — Telehealth (INDEPENDENT_AMBULATORY_CARE_PROVIDER_SITE_OTHER): Payer: Medicare Other | Admitting: Internal Medicine

## 2020-01-14 ENCOUNTER — Encounter: Payer: Self-pay | Admitting: Internal Medicine

## 2020-01-14 DIAGNOSIS — N390 Urinary tract infection, site not specified: Secondary | ICD-10-CM

## 2020-01-14 LAB — URINE CULTURE
MICRO NUMBER:: 10033221
SPECIMEN QUALITY:: ADEQUATE

## 2020-01-14 MED ORDER — CIPROFLOXACIN HCL 250 MG PO TABS: 250.0000 mg | ORAL_TABLET | Freq: Two times a day (BID) | ORAL | 0 refills | Status: DC

## 2020-01-14 NOTE — Telephone Encounter (Signed)
Has Klebsiella UTI. Was seen recently for CPE and urine specimen here was abnormal. Organism is sensitive to Cipro but has  intolerance to levaquin. Try low dose Cipro 250 mg twice a day x 7 days with follow up in 10 days. No fever, chills, dysuria.

## 2020-01-14 NOTE — Telephone Encounter (Signed)
See telephone note. Has Klebsiella UTI that is asymptomatic

## 2020-01-14 NOTE — Telephone Encounter (Signed)
Called and spoke with [name] [name] and let him know they would need to contact ortho for Tramadol. He verbalized understanding.

## 2020-01-20 DIAGNOSIS — S52502D Unspecified fracture of the lower end of left radius, subsequent encounter for closed fracture with routine healing: Secondary | ICD-10-CM | POA: Diagnosis not present

## 2020-01-20 DIAGNOSIS — S3282XD Multiple fractures of pelvis without disruption of pelvic ring, subsequent encounter for fracture with routine healing: Secondary | ICD-10-CM | POA: Diagnosis not present

## 2020-01-21 DIAGNOSIS — S32592D Other specified fracture of left pubis, subsequent encounter for fracture with routine healing: Secondary | ICD-10-CM | POA: Diagnosis not present

## 2020-01-21 DIAGNOSIS — I739 Peripheral vascular disease, unspecified: Secondary | ICD-10-CM | POA: Diagnosis not present

## 2020-01-21 DIAGNOSIS — M349 Systemic sclerosis, unspecified: Secondary | ICD-10-CM | POA: Diagnosis not present

## 2020-01-21 DIAGNOSIS — S62102D Fracture of unspecified carpal bone, left wrist, subsequent encounter for fracture with routine healing: Secondary | ICD-10-CM | POA: Diagnosis not present

## 2020-01-21 DIAGNOSIS — G629 Polyneuropathy, unspecified: Secondary | ICD-10-CM | POA: Diagnosis not present

## 2020-01-21 DIAGNOSIS — S52592D Other fractures of lower end of left radius, subsequent encounter for closed fracture with routine healing: Secondary | ICD-10-CM | POA: Diagnosis not present

## 2020-01-25 ENCOUNTER — Telehealth: Payer: Self-pay | Admitting: Emergency Medicine

## 2020-01-25 ENCOUNTER — Other Ambulatory Visit (HOSPITAL_COMMUNITY): Payer: Medicare Other

## 2020-01-25 ENCOUNTER — Encounter (HOSPITAL_BASED_OUTPATIENT_CLINIC_OR_DEPARTMENT_OTHER): Payer: Medicare Other | Admitting: Internal Medicine

## 2020-01-25 NOTE — Telephone Encounter (Signed)
Called cardiology to cancel appt and appt was already cancelled Joellen [place]

## 2020-01-26 ENCOUNTER — Encounter (HOSPITAL_BASED_OUTPATIENT_CLINIC_OR_DEPARTMENT_OTHER): Payer: Medicare Other | Attending: Internal Medicine | Admitting: Internal Medicine

## 2020-01-26 ENCOUNTER — Other Ambulatory Visit: Payer: Self-pay

## 2020-01-26 DIAGNOSIS — S62102D Fracture of unspecified carpal bone, left wrist, subsequent encounter for fracture with routine healing: Secondary | ICD-10-CM | POA: Diagnosis not present

## 2020-01-26 DIAGNOSIS — L97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: Secondary | ICD-10-CM | POA: Diagnosis not present

## 2020-01-26 DIAGNOSIS — J9610 Chronic respiratory failure, unspecified whether with hypoxia or hypercapnia: Secondary | ICD-10-CM | POA: Diagnosis not present

## 2020-01-26 DIAGNOSIS — G629 Polyneuropathy, unspecified: Secondary | ICD-10-CM | POA: Diagnosis not present

## 2020-01-26 DIAGNOSIS — S52592D Other fractures of lower end of left radius, subsequent encounter for closed fracture with routine healing: Secondary | ICD-10-CM | POA: Diagnosis not present

## 2020-01-26 DIAGNOSIS — L89616 Pressure-induced deep tissue damage of right heel: Secondary | ICD-10-CM | POA: Diagnosis not present

## 2020-01-26 DIAGNOSIS — L97215 Non-pressure chronic ulcer of right calf with muscle involvement without evidence of necrosis: Secondary | ICD-10-CM | POA: Diagnosis not present

## 2020-01-26 DIAGNOSIS — M349 Systemic sclerosis, unspecified: Secondary | ICD-10-CM | POA: Diagnosis not present

## 2020-01-26 DIAGNOSIS — L97319 Non-pressure chronic ulcer of right ankle with unspecified severity: Secondary | ICD-10-CM | POA: Diagnosis not present

## 2020-01-26 DIAGNOSIS — L97811 Non-pressure chronic ulcer of other part of right lower leg limited to breakdown of skin: Secondary | ICD-10-CM | POA: Diagnosis not present

## 2020-01-26 DIAGNOSIS — M341 CR(E)ST syndrome: Secondary | ICD-10-CM | POA: Diagnosis not present

## 2020-01-26 DIAGNOSIS — I739 Peripheral vascular disease, unspecified: Secondary | ICD-10-CM | POA: Diagnosis not present

## 2020-01-26 DIAGNOSIS — S32592D Other specified fracture of left pubis, subsequent encounter for fracture with routine healing: Secondary | ICD-10-CM | POA: Diagnosis not present

## 2020-01-26 DIAGNOSIS — Z9981 Dependence on supplemental oxygen: Secondary | ICD-10-CM | POA: Diagnosis not present

## 2020-01-26 DIAGNOSIS — L89619 Pressure ulcer of right heel, unspecified stage: Secondary | ICD-10-CM | POA: Diagnosis not present

## 2020-01-27 DIAGNOSIS — D044 Carcinoma in situ of skin of scalp and neck: Secondary | ICD-10-CM | POA: Diagnosis not present

## 2020-01-27 DIAGNOSIS — C4442 Squamous cell carcinoma of skin of scalp and neck: Secondary | ICD-10-CM | POA: Diagnosis not present

## 2020-01-28 DIAGNOSIS — M419 Scoliosis, unspecified: Secondary | ICD-10-CM | POA: Diagnosis not present

## 2020-01-28 DIAGNOSIS — I87321 Chronic venous hypertension (idiopathic) with inflammation of right lower extremity: Secondary | ICD-10-CM | POA: Diagnosis not present

## 2020-01-28 DIAGNOSIS — J9611 Chronic respiratory failure with hypoxia: Secondary | ICD-10-CM | POA: Diagnosis not present

## 2020-01-28 DIAGNOSIS — D62 Acute posthemorrhagic anemia: Secondary | ICD-10-CM | POA: Diagnosis not present

## 2020-01-28 DIAGNOSIS — I6523 Occlusion and stenosis of bilateral carotid arteries: Secondary | ICD-10-CM | POA: Diagnosis not present

## 2020-01-28 DIAGNOSIS — E039 Hypothyroidism, unspecified: Secondary | ICD-10-CM | POA: Diagnosis not present

## 2020-01-28 DIAGNOSIS — I8312 Varicose veins of left lower extremity with inflammation: Secondary | ICD-10-CM | POA: Diagnosis not present

## 2020-01-28 DIAGNOSIS — M349 Systemic sclerosis, unspecified: Secondary | ICD-10-CM | POA: Diagnosis not present

## 2020-01-28 DIAGNOSIS — I700 Atherosclerosis of aorta: Secondary | ICD-10-CM | POA: Diagnosis not present

## 2020-01-28 DIAGNOSIS — S52592D Other fractures of lower end of left radius, subsequent encounter for closed fracture with routine healing: Secondary | ICD-10-CM | POA: Diagnosis not present

## 2020-01-28 DIAGNOSIS — F329 Major depressive disorder, single episode, unspecified: Secondary | ICD-10-CM | POA: Diagnosis not present

## 2020-01-28 DIAGNOSIS — S62102D Fracture of unspecified carpal bone, left wrist, subsequent encounter for fracture with routine healing: Secondary | ICD-10-CM | POA: Diagnosis not present

## 2020-01-28 DIAGNOSIS — D631 Anemia in chronic kidney disease: Secondary | ICD-10-CM | POA: Diagnosis not present

## 2020-01-28 DIAGNOSIS — I672 Cerebral atherosclerosis: Secondary | ICD-10-CM | POA: Diagnosis not present

## 2020-01-28 DIAGNOSIS — E785 Hyperlipidemia, unspecified: Secondary | ICD-10-CM | POA: Diagnosis not present

## 2020-01-28 DIAGNOSIS — M810 Age-related osteoporosis without current pathological fracture: Secondary | ICD-10-CM | POA: Diagnosis not present

## 2020-01-28 DIAGNOSIS — G629 Polyneuropathy, unspecified: Secondary | ICD-10-CM | POA: Diagnosis not present

## 2020-01-28 DIAGNOSIS — N1330 Unspecified hydronephrosis: Secondary | ICD-10-CM | POA: Diagnosis not present

## 2020-01-28 DIAGNOSIS — I2510 Atherosclerotic heart disease of native coronary artery without angina pectoris: Secondary | ICD-10-CM | POA: Diagnosis not present

## 2020-01-28 DIAGNOSIS — S32592D Other specified fracture of left pubis, subsequent encounter for fracture with routine healing: Secondary | ICD-10-CM | POA: Diagnosis not present

## 2020-01-28 DIAGNOSIS — I739 Peripheral vascular disease, unspecified: Secondary | ICD-10-CM | POA: Diagnosis not present

## 2020-01-28 DIAGNOSIS — I129 Hypertensive chronic kidney disease with stage 1 through stage 4 chronic kidney disease, or unspecified chronic kidney disease: Secondary | ICD-10-CM | POA: Diagnosis not present

## 2020-01-28 DIAGNOSIS — I77819 Aortic ectasia, unspecified site: Secondary | ICD-10-CM | POA: Diagnosis not present

## 2020-01-28 DIAGNOSIS — K8020 Calculus of gallbladder without cholecystitis without obstruction: Secondary | ICD-10-CM | POA: Diagnosis not present

## 2020-01-28 DIAGNOSIS — I2721 Secondary pulmonary arterial hypertension: Secondary | ICD-10-CM | POA: Diagnosis not present

## 2020-01-29 DIAGNOSIS — S52592D Other fractures of lower end of left radius, subsequent encounter for closed fracture with routine healing: Secondary | ICD-10-CM | POA: Diagnosis not present

## 2020-01-29 DIAGNOSIS — S32592D Other specified fracture of left pubis, subsequent encounter for fracture with routine healing: Secondary | ICD-10-CM | POA: Diagnosis not present

## 2020-01-29 DIAGNOSIS — M349 Systemic sclerosis, unspecified: Secondary | ICD-10-CM | POA: Diagnosis not present

## 2020-01-29 DIAGNOSIS — S62102D Fracture of unspecified carpal bone, left wrist, subsequent encounter for fracture with routine healing: Secondary | ICD-10-CM | POA: Diagnosis not present

## 2020-01-29 DIAGNOSIS — I739 Peripheral vascular disease, unspecified: Secondary | ICD-10-CM | POA: Diagnosis not present

## 2020-01-29 DIAGNOSIS — G629 Polyneuropathy, unspecified: Secondary | ICD-10-CM | POA: Diagnosis not present

## 2020-02-01 NOTE — Progress Notes (Signed)
[name] [name], [name] [name] ([id number]) Visit Report for 01/26/2020 HPI Details Patient Name: Date of Service: [name] [name], [name] [name] 01/26/2020 10:30 AM Medical Record Number:[id number] Patient Account Number: [id number] Date of Birth/Sex: Treating RN: 12/20/1951 (69 y.o. F) Primary Care Provider: [name] Other Clinician: Referring Provider: Treating Provider/Extender:Christianna Belmonte, Leotis Shames, MARY Weeks in Treatment: 409 History of Present Illness HPI Description: this is a patient who initially came to [place] for wounds on the medial malleoli bilaterally as well as her upper medial lower extremities bilaterally. These wounds eventually healed with assistance of Apligraf's bilaterally. While this was occurring she developed the current wound which opened into a fairly substantial wound on the right lower extremity. These are mostly secondary to venous stasis physiology however the patient also has underlying scleroderma, pulmonary hypertension. The wound has been making good progress lately with the Hydrofera Blue-based dressing. 03/17/2015; patient had a arterial evaluation a year ago. Her right ABI was 0.86 left was 1.0. Toe brachial index was 0.41 on the right 0.45 on the left. Her bilateral common femoral artery waveforms were triphasic. Her white popliteal posterior tibial artery and anterior tibial artery waveforms were monophasic with good amplitude. Luteal artery waveforms were biphasic it was felt that her bilateral great toe pressures are of normal although adequate for tissue healing. 03/24/2015. The condition of this wound is not really improved that. He was covered with as fibrinous surface slough and eschar. This underwent an aggressive debridement with both a curette and scalpel. I still cannot really get down to what I can would consider to be a viable surface. There is no evidence of infection. Previous workup for ischemia roughly a year ago was negative nevertheless I think that continues to  be a concern 04/07/15. The patient arrived for application of second Apligraf. Once again the surface of this wound is certainly less viable than I would like for an advanced treatment option. An aggressive debridement was done. She developed some arteriolar bleeding which required that along pressure and silver alginate. 04/21/15: change in the condition of this wound. Once again it is covered in a gelatinous surface slough. After debridement today surface of the wound looks somewhat better but now a heeling surface 05/05/15 Apligraf #4 applied.Still a lot of slough on this wound. 05/19/15 Apligraf #5 applied. Still a lot of slough on this wound I did not aggressively remove this 06/02/15; continued copious amounts of surface slough. This debridement fairly easily. 06/08/2015 -- the last time she had a venous duplex study done was over 3 years ago and the surgery was prior to that. I have recommended that she sees Dr. early for a another opinion regarding a repeat venous duplex and possibly more endovenous ablation of vein stripping of micro-phlebotomies. 06/16/15; wound has a gelatinous surface eschar that the debridement fairly easily to a point. I don't disagree with the venous workup and perhaps even arterial re-evaluation. She is on prednisone 5 mg and continue his medications for her pulmonary artery hypertension I am not sure if the latter have any wound care healing issues I would need to investigate this. 06/23/15 continues with a gelatinous surface eschar with of fibrinous underlying. What I can see of this wound does not look unhealthy however I just can't get this material which I think is 2 different layers off. Empiric culture done 06/30/15; unfortunately not a lot of change in this wound. A gelatinous surface eschar is easily removed however it has a tight fibrinous surface underneath the. 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 The wound comes back and basically and  unchanged state the. She has a gelatinous surface that is more easily removed however there is a tightly fibrinous surface underneath the. There is no evidence of infection. She has a vascular follow-up next month. I would have to inject her in order to do a more aggressive debridement of this area 07/21/15 the wound is roughly in the same state albeit the debridement is done with greater ease. There is less of the fibrinous eschar underneath the. There is no evidence of infection. She has follow-up with vascular surgery next week. No evidence of surrounding infection. Her original distal wounds healed while this one formed. 07/28/15; wound is easier to debride. No wound erythema. She is seeing Dr. [name] next week. 08/18/15 Has been to [place] for repeat ablation. Have been using medihoney pad with some improvement 08/25/15; absolutely no change in the condition of this wound in either its overall size or surface condition in many months now. At one point I had this down to [place] healthier surface I think with [facility] however this did not actually progress towards closure. Do not believe that the wound has actually deteriorated in terms of volume at all. We have been using a medihoney pad which allows easier debridement of the gelatinous eschar but again no overall actual improvement. the patient is going towards an ablation with pain and vascular which I think is scheduled for next week. The only other investigation that I could first see would be a biopsy. She does have underlying scleroderma 09/02/15 eschar is much easier to debridement however the base of this does not look particularly vibrant. We changed to Iodoflex. The patient had her ablation earlier this week 09/09/15 again the debridement over the base of this wound is easier and the base of the wound looks considerably better. We will continue the Iodoflex. Dr. Tawni Millers has expressed his satisfaction with the result of her  ablation 09/15/15 once again the wound is relatively free of surface eschar. No debridement was done today. It has a pale- looking base to it. although this is not as deep as it once was it seems to be expanding especially inferiorly. She has had recent venous ablation but this is no closer to healing.I've gone ahead and done a punch biopsy this from the inferior part of the wound close to normal skin 09/22/15: the wound is relatively free of surface eschar. There is some surrounding eschar. I'm not exactly sure at what level the surface is that I am seeing. 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 No evidence of atypical infection, malignancy. 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 No debridement was done 09/29/15; the wound surface is relatively free of surface eschar. There is eschar surrounding the walls of the wound. Aside from the improvement in the amount of surface slough. This wound has not progressed any towards closure. There is not even a surface that looks like there at this is ready. There is no evidence of any infection nor maligancy based on biopsy I did on 9/15. I continued with the Iodoflex however I am looking towards some alternative to try to promote some closure or filling in of this surface. Consider triple layer Oasis. Collagen did not result in adequate control of the surface slough 10/13/15; the patient was in hospital last week with severe anemia. The wound looks somewhat better after debridement although there is widening medially. There  is no evidence of infection. 10/20/15; patient's wound on the right lateral lower leg is essentially unchanged. This underwent a light surface debridement and in general the debridement is easier and the surface looks improved. I noted in doing this on the side of the wound what appears to be a piece of calcium deposition. The patient noted that she had previous things on the  tips of her fingers. 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 She follows with rheumatology and I have asked them to talk to her about this. In view of that S the nonhealing ulcer may have something to do with calcinosis and also unrelieved Raynaud's disease in this area. I should note that her original wounds on the right and left medial malleolus and the inner aspects of both legs just below the knees did however heal over 10/30/15; the patient's wound on the right lateral lower leg is essentially unchanged. I was able to remove some calcified material from the medial wound edge. 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 Otherwise the wound appears essentially unchanged there is less adherent surface eschar. Some of the calcified material was sent to pathology for analysis 11/17/15. The patient's wound on the right lateral leg is essentially unchanged. Wider Medially. 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 There is some surface slough but I did not debridement this. No evidence of surrounding infection. 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 This will make healing difficult 12/15/15; the wound has less depth surface has a fibrinous slough and calcifications in the wound edge no evidence of infection 12/22/15; the wound definitely has less Fibrinous slough on the base. Calcifications around the wound edges are still evident. Although the wound bed looks healthier it is still pale in appearance. Previous biopsy did not show malignancy 01/04/15; surgical debridement of nonviable  slough and subcutaneous tissue the wound cleans up quite nicely but appears to be expanding outward calcifications around the wound edges are still there. 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 She is to see her rheumatologist I'll see if he has any opinions on this. My punch biopsy done in September did not show calcifications although these are clearly evident. 01/19/16 light selective debridement of nonviable surface slough. There is epithelialization medially. This gives me reason for cautious optimism. She has been to see her rheumatologist, there is nothing that can be done for this type of soft tissue calcification associated with scleroderma 02/02/16 no debridement although there is a light surface slough. She has 2 peninsulas of skin 1 inferiorly and one medially. We continue to make a slight and slow but definite progressive here 02/16/16; light surface debridement with more attention to the circumference of the wound bed where the fibrinous eschar is more prevalent. No calcifications detected. She seems to have done nicely with the [facility] with some epithelialization and some improvement in the overall wound volume. 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 Continued attention to the circumference of the wound where the fibrinous eschar in  calcinosis or prevalent. No calcifications were detected. She has continued improvement with Hydrofera Blue. The wound is no longer as deep 03/15/16 surgical debridement done to remove surface escha Especially around the circumference of the wound where there is nonviable subcutaneous tissue. In spite of this there is considerable improvement in the overall dimensions and depth of the wound. Islands of epithelialization are seen especially medially inferiorly and superiorly to a  lesser extent. She is using Hydrofera Blue at home 03/29/16; surgical debridement done to remove surface eschar and nonviable subcutaneous tissue. This cleans up quite nicely mention slightly larger in terms of length and width however depth is less 04/12/16; continued gradual improvement in terms of depth and the condition of the wound base. Debridement is done. Continuing long standing Hydrofera Blue at home with Kerlix Coban wraps 04/26/16; continued gradual improvement in terms of depth and management as well as condition of the wound base. Surgical debridement done she continues with Hydrofera Blue. 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 She initially came to this clinic venous insufficiency ulcers which have long since healed 05/17/16 continued improvement in terms of the depth and measurements of this wound although she has a tightly adherent fibrinous slough each time. We've been continued with long standing Hydrofera Blue which seems to done as well for this wound is many advanced treatment options. 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 She also has chronic venous insufficiency. She has an irritation on her lateral right ankle secondary to our wraps 05/10/16; wound appears to be smaller especially on the medial aspect and especially in the width. Wound was debridement surface looks better. She is also been in the hospital apparently with anemia again she tells me she had an endoscopy. Since she got home after 3 days which I believe was sometime last week she has had an irritated painful area on the right lateral ankle surrounding the lateral malleolus 05/31/16; much more adherent surface slough today then recently although I don't think the dimensions of changed that much. A more aggressive debridement is required. The irritated area over her medial malleolus is more pruritic and painful and I don't think  represents cellulitis 06/14/16; no major change in her wound dimensions however there is more tightly adherent surface slough which is disappointing. As well as she appears to have a new small area medially. Furthermore an irritated uncomfortable area on the lateral aspect of her right foot just below the lateral malleolus. 06/21/16; I'm seeing the patient back in follow-up for the new areas under her major wound on the right anterior leg. She has been using Hydrofera Blue to this area probably for several months now and although the dressings seem to be helping for quite a period of time I think things have stagnated lately. She comes in today with a relatively tight adherent surface slough and really no changes in the wound shape or dimension. The 2 small areas she had inferiorly are tiny but still open they seem improved this well. There is no uncontrolled edema and I don't think there is any evidence of cellulitis. 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 Patient has had vascular evaluations both venous and arterial. I have biopsied this area. There is no obvious infection. The worrisome thing today is that she seems to be developing areas of erythema and  epithelial damage on the medial aspect of the right foot. Also to a lesser degree inferior to the actual wound itself. Again I see no obvious changes to suggest cellulitis however as this is the only treatable option I will probably give her antibiotics. 07/13/16 no major change in the lady's large anterior right leg wound. Still covered with a very tightly adherent surface slough which is difficult to debridement. There is less erythema around this, culture last week grew pseudomonas I gave her ciprofloxacin. The area on her lateral right malleolus looks better- 08/02/16 the patient's wounds continued to decline. Her original large  anterior right leg wound looks deeper. Still adherent surface slough that is difficult to debridement. She has a small area just below this and a punched-out wound just below her lateral malleolus. In the meantime she is been in hospital with apparently an upper GI bleed on Plavix and aspirin. She is now just on Plavix she received 3 units of packed cells 08/23/16; since I last saw this 3 weeks ago, the open large area on her right leg looks about the same syrup. She has a small satellite lesion just underneath this. The area on her medial right ankle is now a deep necrotic wound. I attempted to debridement this however there is just too much pain. 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 She follows with vascular surgery and is scheduled for an angiogram in early September 09/06/16; the patient is going for an arteriogram tomorrow. Her original large wound on the right calf is about the same the satellite lesion underneath it is about the same however the area on her medial ankle is now deeper with exposed tendon. I am no longer attempting to debride these wounds 09/20/16; the patient has undergone a right femoral endarterectomy and Dacron patch angioplasty. This seems to have helped the flow in her right leg. 10/04/16; Arrived today for aggressive debridement of the wounds on her right calf the original wound the one beneath it and a difficult area over her right lateral leg just above the lateral malleolus. 10/25/16; her 3 open wounds are about the same in terms of dimensions however the surface appears a lot healthier post debridement. Using Iodoflex 10/18/16 we have been using Iodoflex to her wounds which she tolerated with some difficulty. 10/11/16; has been using Santyl for a period of time with some improvement although again very adherent surface slough would prevent any attempted healing this. She has a original wound  on the left calf, the satellite underneath that and the most recent wound on the right medial ankle. She has completed revascularization by Dr. early and has had venous ablation earlier. Want to go back to Iodoflex to see if week and get a healthier surface to this wound bed failing this I think she'll need to be taken to the OR and I am prepared to call Dr. Marla Roe to discuss this. She is obviously not a good candidate for general anesthesia however.; 11/08/16; I put her on Iodoflex last time to see if I can get the wound bed any healthier and unfortunately today still had tremendous surface slough. 11/15/16; 4 weeks' worth of Iodoflex with not much improvement. Debridement on the major wounds on her left anterior leg is easier however this does not maintain from week to week. The punched-out area on her medial right ankle 11/29/16; I attempted to change the patient last visit to [facility]  however she states this burned and was very uncomfortable therefore we gave her permission to go back to the eye out of complex which she already had at home. Also she noted a lot of pain and swelling on the lateral aspect of her leg before she traveled to [facility] for the holidays, I called her in doxycycline over the phone. This seems to have helped 12/06/16; Wounds unchanged by in large. Using Iodoflex 12/13/16; her wounds today actually looks somewhat better. The area on the right lateral lower leg has reasonably healthy-looking granulation and perhaps as actually filled and a bit. Debridement of the 2 wounds on the medial calf is easier and post debridement appears to have a healthier base. We have referred her to Dr. Migdalia Dk for consideration of operative debridement 12/20/16; we have a quick note from Dr. Merri Ray who feels that the patient needs to be referred to an academic center/plastic surgeon. This is due to the complexity of the patient's medical issues as it  applies to anything in the OR. We have been using Iodoflex 12/27/16; in general the wounds on the right leg are better in terms of the difficult to remove surface slough. She has been using Iodoflex. She is approved for Apligraf which I anticipated ordering in the next week or 2 when we get a better-looking surface 01/04/16 the deep wounds on the right leg generally look better. Both of them are debrided further surface slough. The area on the lateral right leg was not debrided. She is approved for Apligraf I think I'll probably order this either next week or the week after depending on the surface of the wounds superiorly. We have been using Iodoflex which will continue until then 01/11/16; the deep wounds on the right leg again have a surface slough that requires debridement. I've not been able to get the wound bed on either one of these wounds down to what would be acceptable for an advanced skin stab- like Apligraf. The area on the medial leg has been improving. We have been using hot Iodoflex to all wounds which seems to do the best at at least limiting the nonviable surface 01/24/17; we have continued Iodoflex and all her wound areas. Her debridement Gen. he is easier and the surface underneath this looks viable. Nevertheless these are large area wounds with exposed muscle at least on the anterior parts. We have ordered Apligraf's for 2 weeks from now. The patient will be away next week 02/07/17; the patient was close to have first Apligraf today however we did not order one. I therefore replaced her Iodoflex. She essentially has 3 large punched-out areas on her right anterior leg and right medial ankle. 02/11/17; Apligraf #1 02/25/17; Apligraf #2. In general some improvement in the right medial ankle and right anterior leg wounds. The larger wound medially perhaps some better 03/11/17; Apligraf #3. In general continued improvement in the right medial ankle and the right anterior leg  wounds. 03/25/17 Apligraf #4. In general continued improvement especially on the right medial ankle and the lower 04/08/17; Apligraf #5 in general continued improvement in all wound sites. 04/22/17; post Apligraf #5 her wound beds continued to look a lot better all of them up to the surface of the surrounding skin. Had a caramel-colored slough that I did not debrided in case there is residual Apligraf effect. The wounds are as good as I have seen these looking quite some time. 04/29/17; we applied [place] and last week after completing her Apligraf.  Wounds look as though they've contracted somewhat although they have a nonviable surface which was problematic in the past. Apparently she has been approved for further Apligraf's. We applied Iodosorb today after debridement. 05/06/17; we're fortunate enough today to be able to apply additional Apligraf approved by her insurance. In general all of her wounds look better Apligraf #6 05/24/17; Apligraf #7 continued improvement in all wounds 3 06/10/17 Apligraf #8. Continued improvement in the surface of all wounds. Not much of an improvement in dimensions as I might a follow 06/24/17 Apligraf #9 continued general improvement although not as much change in the wound areas I might of like. She has a new open area on the right anterior lateral ankle very small and superficial. She also has a necrotic wound on the tip of her right index finger probably secondary to severe uncontrolled Raynaud's phenomenon. She is already on sildenafil and already seen her rheumatologist who gave her Keflex. 07/08/17; Apligraf #10. Generalized improvement although she has a small additional wound just medial to the major wound area. 07/22/17; after discussion we decided not to reorder any further Apligraf's although there is been considerable improvement with these it hasn't been recently. The major wound anteriorly looks better. Smaller wounds beneath this and the more recent  one and laterally look about the same. The area on the right lateral lower leg looks about the same 07/29/17 this is a patient who is exceedingly complex. She has advanced scleroderma, crest syndrome including calcinosis, PAD status post revascularization, chronic venous insufficiency status post ablations. She initially presented to this clinic with wounds on her bilateral lower legs however these closed. More recently we have been dealing with a large open area superiorly on the right anterior leg, a smaller wound underneath this and then another one on the right medial lower extremity. These improve significantly with 10 Apligraf applications. Over the last 2-3 weeks we are making good progress with Hydrofera Blue and these seem to be making progress towards closure 08/19/17; wounds continued to make good improvement with Hydrofera Blue and episodic aggressive debridements 08/26/17; still using Hydrofera Blue. Good improvements 09/09/17; using Hydrofera Blue continued improvement. Area on the lateral part of her right leg has only a small remaining open area. The small inferior satellite region is for all intents and purposes closed. Her major wound also is come in in terms of depth and has advancing epithelialization. 09/16/17; using Hydrofera Blue with continued improvement. The smaller satellite wound. We've closed out today along with a new were satellite wound medially. The area on her medial ankle is still open and her major wound is still open but making improvement. All using Hydrofera Blue. Currently 09/30/17; using Hydrofera Blue. Still a small open area on the lateral right ankle area and her original major wound seems to be making gradual and steady improvement. 10/14/17; still using Hydrofera Blue. Still too small open areas on the right lateral ankle. Her original major wound is horizontal and linear. The most problematic area paradoxically seems to be the area to the medial area wears  I thought it would be the lateral. 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 10/28/17; still using Hydrofera Blue. Right lateral ankle has a very small open area superiorly on the most lateral part of the wound. Her original open wound has 2 open areas now separated by normal skin and we've redefined this. 11/11/17; still using Hydrofera Blue area and right lateral ankle continues to have  a small open area on mostly the lateral part of the wound. Her original wound has 2 small open areas now separated by a considerable amount of normal skin 11/28/17; the patient called in slightly before Thanksgiving to report pain and erythema above the wound on the right leg. In the past this is responded well to treatment for cellulitis and I gave her over the phone doxycycline. She stated this resulted in fairly abrupt improvement. We have been using Hydrofera Blue for a prolonged period of time to the larger wound anteriorly into the remaining wound on her right right lateral ankle. The latter is just about closed with only a small linear area and the bottom of the [place]. 12/02/17; use endoform and left the dressing on since last visit. There is no tenderness and no evidence of infection. 12/16/17; patient has been using Endoform but not making much progress. The 2 punched out open areas anteriorly which were the reminiscence of her major wound appear deeper. The area on the lateral aspect of her right calf also appears deeper. Also she has a puzzling tender swelling above her wound on the right leg. This seems larger than last time. Just above her wounds there appears to be some fluctuance in this area it is not erythematous and there is no crepitus 12/30/17; patient has been using Endoform up until last week we used Hydrofera Blue. Ultrasound of the swelling above her 2 major wounds last time was negative for a fluid collection. I gave her cefaclor for the  erythema and tenderness in this area which seems better. Unfortunately both punched out areas anteriorly and the area on her right medial lower leg appear deeper. In fact the lateral of the wounds anteriorly actually looks as though it has exposed tendon and/or muscle sheath. She is not systemically unwell. She is complaining of vaginitis type symptoms presumably Candida from her antibiotics. 01/06/18; we're using santyl. she has 2 punched out areas anteriorly which were initially part of a large wound. Unfortunately medially this is now open to tendon/muscle. All the wounds have the same adherent very difficult to debride surface. 01/20/18; 2 week follow-up using Santyl. She has the 2 punched out areas anteriorly which were initially part of her large surface wound there. Medially this still has exposed muscle. All of these have the same tightly adherent necrotic surface which requires debridement. PuraPly was not accepted by the patient's insurance however her insurance I think it changed therefore we are going to run Apligraf to gain 02/03/18; the patient has been using Santyl. The wound on the right lateral ankle looks improved and the 2 areas anteriorly on the right leg looks about the same. The medial one has exposed muscle. The lateral 1 requiresdebridement. We use PuraPly today for the first week 02/10/18; PuraPly #2. The patient has 3 wounds. The area on the right lateral ankle, 2 areas anteriorly that were part of her original large wound in this area the medial one has exposed muscle. All of the wounds were lightly debrided with a number 3 curet. PuraPly #2 applied the lateral wound on the calf and the right lateral ankle look better. 02/17/18; PuraPly #3. Patient has 3 wounds. The area on the right lateral ankle in 2 areas internally that were part of her original large wound. The lateral area has exposed muscle. She arrived with some complaints of pain around the right ankle. 02/24/18;  PuraPly #4; not much change in any of the 3 wound areas. Right lateral ankle,  right lateral calf. Both of these required debridement with a #3 curet. She tolerates this marginally. The area on the medial leg still has exposed muscle. Not much change in dimensions 03/03/18 PuraPly #5. The area on the medial ankle actually looks better however the 2 separate areas that were original parts of the larger right anterior leg wound look as though they're attempting to coalesce. 03/10/18; PuraPly #6. The area on the medial ankle actually continues to look and measures smaller however the 2 separate areas that were part of the original large wound on the right anterior leg have now coalesced. There hasn't been much improvement here. The lateral area actually has underlying exposed muscle 03/17/18-she is here in follow-up evaluation for ulcerations to the right lower extremity. She is voicing no complaints or concerns. She tolerated debridement. Puraply#7 placed 03/24/18; difficult right lower extremity ulcerations. PuraPly #8 place. She is been approved for [employer]. She did very well with Apligraf today however she is apparently reached her "lifetime max" 03/31/18; marginal improvement with PuraPly although her wounds looked as good as they have in several weeks today. Used TheraSkins #1 04/14/18 TheraSkin #2 today 04/28/18 TheraSkins #3. Wound slightly improved 05/12/18; TheraSkin skin #4. Wound response has been variable. 05/27/18 TheraSkin #5. Generally improvement in all wound areas. I've also put her in 3 layer compression to help with the severe venous hypertension 06/09/18; patient is done quite well with the TheraSkins unfortunately we have no further applications. I also put her in 3 layer compression last week and that really seems to of helped. 06/16/18; we have been using silver collagen. Wounds are smaller. Still be open area to the muscle layer of her calf however even that is contracted  somewhat. She tells me that at night sometimes she has pain on the right lateral calf at the site of her lower wound. Notable that I put her into 3 layer compression about 3 weeks ago. She states that she dangles her leg over the bed that makes it feel better but she does not describe claudication during the day She is going to call her secondary insurance to see if they will continue to cover advanced treatment products I have reviewed her arterial studies from 01/22/17; this showed an ABI in the right of 1 and on the left noncompressible. TBI on the right at 0.30 on the left at 0.34. It is therefore possible she has significant PAD with medial calcification falsely elevating her ABI into the normal range. I'll need to be careful about asking her about this next week it's possible the 3 layer compression is too much 06/23/18; was able to reapply TheraSkin 1 today. Edema control is good and she is not complaining of pain no claudication 07/07/18;no major change. New wound which was apparently a taper removal injury today in our clinic between her 2 wounds on the right calf TheraSkins #2 07/14/18; I think there is some improvement in the right lateral ankle and the medial part of her wound. There is still exposed muscle medially. 07/28/18; two-week follow-up. TheraSkins#3. Unfortunately no major change. 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 She seems to be doing well and in the base of the wound appears to show some progress at this point. This is the last approved [employer] of the second set. 08/25/18; she has completed  TheraSkin. There has been some improvement on the right lateral calf wound as well as the anterior leg wounds. The open area to muscle medially on the anterior leg wound is smaller. I'm  going to transition her back to [facility] under Kerlix Coban change every second day. She reports that she had some calcification removed from her right upper arm. We have had previous problems with calcifications in her wounds on her legs but that has not happened recently 09/08/18;using Hydrofera Blue on both her wound areas. Wounds seem to of contracted nicely. She uses Kerlix Coban wrap and changes at home herself 09/22/2018; using Hydrofera Blue on both her wound areas. Dimensions seem to have come down somewhat. There is certainly less depth in the medial part of the mid tibia wound and I do not think there is any exposed muscle at this point. 10/06/2018; 2-week follow-up. Using [facility] on her wound areas. Dimensions have come down nicely both on the right lateral ankle area in the right mid tibial area. She has no new complaints 10/20/2018; 2-week follow-up. She is using Hydrofera Blue. Not much change from the last time she was here. The area on the lateral ankle has less depth although it has raised edges on one side. I attempted to remove as much of the raised edge as I could without creating more additional wounds. The area on the right anterior mid tibia area looks the same. 11/03/2018; 2-week follow-up she is using Hydrofera Blue. On the right anterior leg she now has 2 wounds separated by a large area of normal skin. The area on the medial part still has I think exposed muscle although this area itself is a lot smaller. The area laterally has some depth. Both areas with necrotic debris. The area on her right lateral ankle has come in nicely 11/17/2018; patient continues to use Hydrofera Blue. We have been increasing separation of the 2 wounds anteriorly which were at one point joint the area on the right lateral calf continues to have I think some improvement in depth. 12/08/2018; patient continues to use Hydrofera Blue. There is some improvement in the area on the right  lateral calf. The 2 areas that were initially part of the original large wound in the mid right tibia are probably about the same. In fact the medial area is probably somewhat larger. We will run puraply through the patient's insurance 12/22/2018; she has been using Hydrofera Blue. We have small wounds on the right lateral calf and 2 small areas that were initially part of a large wound in the right mid tibia. We applied pure apply #1 today. 12/29/2018; we applied puraply #2. Her wounds look somewhat better especially on the right lateral calf and the lateral part of her original wound in the mid tibial area. 01/05/2019; perhaps slightly improved in terms of wound bed condition but certainly not as much improvement as I might of liked. Puraply #3 1/13: we did not have a correct sized puraply to apply. wounds more pinched out looking, I increased her compression to 3 layer last week to help with significant multilevel venous hypertension. Since then I've reviewed her arterial status. She has a right femoral endarterectomy and a distal left SFA stent. She was being followed by Dr. [name] for a period however she does not appear to have seen him in 3 years. I will set up an appointment. 1/20. The patient has an additional wound on the right lateral calf between the distal wound and proximal  wounds. We did not have Puraply last week. Still does not have a follow-up with Dr. early 1/27: Follow-up with Dr. early has been arranged apparently with follow-up noninvasive studies. Wounds are measuring roughly the same although they certainly look smaller 2/3; the patient had non invasive studies. Her ABIs on the right were 0.83 and on the left 1.02 however there was no great toe pressure bilaterally. Also worrisome monophasic waveforms at the PTA and dorsalis pedis. We are still using Puraply. We have had some improvement in all of the wounds especially the lateral part of the mid tibial area. 2/10; sees Dr.  early of vein and vascular re-arterial studies next week. Puraply reapplied today. 2/17; Dr. early of vein and vascular his appointment is tomorrow. Puraply reapplied after debridement of all wounds 2/24; the patient saw Dr. early I reviewed his note. sHe noted the previous right femoral endarterectomy with a Dacron patch. He also noted the normal ABI and the monophasic waveforms suggesting tibial disease. Overall he did not feel that she had any evidence of arterial insufficiency that would impair her wound healing. He did note her venous disease as well. He suggested PRN follow-up. 3/2; I had the last puraply applied today. The original wounds over the mid tibia area are improved where is the area on the right lower leg is not 3/9; wounds are smaller especially in the right mid tibia perhaps slightly in the right lateral calf. We finished with puraply and went to endoform today 3/23; the patient arrives after 2 weeks. She has been using endoform. I think all of her wounds look slightly better which includes the area on the right lateral calf just above the right lateral malleolus and the 2 in the right mid tibia which were initially part of the same wound. 4/27; TELEHEALTH visit; the patient was seen for telehealth visit today with her consent in the middle of the worldwide epidemic. Since she was last here she called in for antibiotics with pain and tenderness around the area on the right medial ankle. I gave her empiric doxycycline. She states this feels better. She is using endoform on both of these areas 5/11 TELEHEALTH; the patient was seen for telehealth visit today. She was accompanied at home by her husband. She has severe pulmonary hypertension accompanied scleroderma and in the face of the Covid epidemic cannot be safely brought into our clinic. We have been using endoform on her wound areas. There are essentially 3 wound areas now in the left mid tibia now 2 open areas that it one  point were connected and one on the right lateral ankle just above the malleolus. 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 She cannot come to clinic. The wounds were reviewed today via telehealth. She has severe chronic venous hypertension which I think is centrally mediated. She has wounds on her right anterior tibia and right lateral ankle area. These are chronic. She has been using endoform. 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 We have been following her from telehealth. She has severe chronic venous hypertension which may be mostly centrally mediated secondary to right heart heart failure. She has wounds on her right anterior tibia and right lateral ankle these  are chronic we have been using endoform 6/22; TELEHEALTH; this patient was seen today via telehealth. 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 Since the last time we had contact with this patient she developed some pain and erythema around the wound on her right lateral malleolus/ankle and we put in antibiotics for her. This is resulted in good improvement with resolution of the erythema and tenderness. I changed her to silver alginate last time. We had been using endoform for an extended period of time 7/13; TELEHEALTH; this patient was seen today via telehealth. 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 She would continue to be at a prohibitive risk to be brought into our clinic unless this was absolutely necessary. These visits have been done with her approval as well as her husband. We  have been using silver alginate to the areas on the right mid tibia and right lateral lower leg. 7/27 TELEHEALTH; patient was seen along with her husband today via telehealth. 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 We changed her to sample last visit. She has 2 areas a chronic wound on her right mid tibia and one just above her ankle. These were not the original wounds when she came into this clinic but she developed them during treatment 8/17; she comes in for her first face-to-face visit in a long period. She has a remaining area just medial to the right tibia which is the last open part of her large wound across this area. She also has an area on the right lateral lower leg. We prescribed Santyl last telehealth visit but they were concerned that this was making a deeper so they put silver alginate on it last week. Her husband changes the dressings. 8/31; using Santyl to the 2 wound areas some improvement in wound surfaces. Husband has surgery in 2 weeks we will put her out 3 weeks. Any of the advanced treatment options that I can think of that would be eligible for this wound would also cause her to have to come in weekly. The risk that the patient is just too high 9/21. Using Santyl to the 2 wound areas. Both of these are somewhat better although the medial mid tibia area still has exposed muscle. Lateral ankle requiring debridement. Using Santyl 10/12; using Santyl to the 2 wound areas. One on the right lateral ankle and the other in the medial calf which still has exposed muscle. Both areas have come down in size and have better looking surfaces. She has made nice progress with santyl 11/2; we have been using Santyl to the 2 wound areas. Right lateral ankle and the other in the mid tibia area the medial part of this still has exposed muscle. 11/23/2019 on evaluation today patient appears to be  doing about the same really with regard to her wounds. She is actually not very pleased with how things seem to be progressing at this point she tells me that she really has not noted much improvement unfortunately. With that being said there is no signs of active infection at this time. There is some slough buildup noted at this time which again along with some dry skin around the edges of the wound I think would benefit her to try to debride some of this away. Fortunately her pain is doing fairly  well. She still has exposed muscle in the right medial/tibial area. 12/14; TELEHEALTH; she was changed to [facility] to the right calf wound and right lateral ankle wound when she was here last time. Unfortunately since then she had a fall with a pelvic fracture and a fracture of her wrist. She was apparently hospitalized for 5 days. I have not looked at her discharge summary. She apparently came out of the hospital with a blister on her right heel. She was seen today via telehealth by myself and our case manager. The patient and her husband were present. She has been using Hydrofera Blue at our direction from the last time she was in the clinic. There is been no major improvement in fact the areas appear deeper and with a less viable surface 01/04/2020; TELEHEALTH; the patient was seen today in accompaniment of her husband and our nurse. She has 3 open areas 1 on the right medial mid tibia, one on the right lateral ankle and a large eschared pressure area on the right heel. We have been using Iodoflex to the 2 original wounds. 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 It is simply too perilous for her to be seen in any other way 1/26; TELEHEALTH; the patient was seen today in accompaniment of her husband and our RN one of our nurses. She still has the 3 open areas 1 on the right medial mid tibia which is the remanent of a more extensive wound in this area, one on  the right lateral ankle and a large eschared pressure area on the right heel. We have been using Iodoflex to the 2 original wounds and a bed at 9 application to the eschared area on the heel Electronic Signature(s) Signed: 01/26/2020 5:32:11 PM By: Linton Ham MD Entered By: Linton Ham on 01/26/2020 13:09:24 -------------------------------------------------------------------------------- Physical Exam Details Patient Name: Date of Service: [name] [name] 01/26/2020 10:30 AM Medical Record Number:[id number] Patient Account Number: [id number] Date of Birth/Sex: Treating RN: 31-Jul-1951 (69 y.o. F) Primary Care Provider: [name] Other Clinician: Referring Provider: Treating Provider/Extender:Homar Weinkauf, Leotis Shames, MARY Weeks in Treatment: 409 Notes Wound exam; the area on the mid tibia has exposed muscle medially and looks about the same. Right lateral ankle has considerable debris over the surface discolored with Iodoflex. This really needs more aggressive mechanical debridement. The eschared black area over the right heel is separating from what I can see underneath it looks like viable skin over a large area Electronic Signature(s) Signed: 01/26/2020 5:32:11 PM By: Linton Ham MD Entered By: Linton Ham on 01/26/2020 13:10:08 -------------------------------------------------------------------------------- Physician Orders Details Patient Name: Date of Service: [name] [name] 01/26/2020 10:30 AM Medical Record Number:[id number] Patient Account Number: [id number] Date of Birth/Sex: Treating RN: [birth year] (69 y.o. [name] Primary Care Provider: [name] Other Clinician: Referring Provider: Treating Provider/Extender:Quantavia Frith, Leotis Shames, MARY Weeks in Treatment: [phone] Verbal / Phone Orders: No Diagnosis Coding Follow-up Appointments Return appointment in 3 weeks. Dressing Change Frequency Wound #16 Right Calcaneus Change dressing every  day. Wound #5 Right,Medial Lower Leg Change Dressing every other day. Wound #7 Right,Lateral Malleolus Change Dressing every other day. Skin Barriers/Peri-Wound Care Moisturizing lotion - both legs Wound Cleansing May shower and wash wound with soap and water. Primary Wound Dressing Wound #16 Right Calcaneus Other: - Paint with Betadine Wound #5 Right,Medial Lower Leg Iodoflex - may use Iodosorb gel if Iodoflex not available Wound #7 Right,Lateral Malleolus Iodoflex - may use Iodosorb gel if Iodoflex not available Secondary  Dressing Wound #16 Right Calcaneus Heel Cup Wound #5 Right,Medial Lower Leg Dry Gauze Wound #7 Right,Lateral Malleolus Dry Gauze Edema Control Kerlix and Coban - Right Lower Extremity Avoid standing for long periods of time Elevate legs to the level of the heart or above for 30 minutes daily and/or when sitting, a frequency of: - throughout the day Off-Loading Turn and reposition every 2 hours Other: - float heels off of bed/chair with pillow under calves [facility] for wound care. Alvis Lemmings Electronic Signature(s) Signed: 01/26/2020 5:32:11 PM By: Linton Ham MD Signed: 02/01/2020 6:39:44 PM By: Levan Hurst RN, BSN Entered By: Levan Hurst on 01/26/2020 11:46:37 -------------------------------------------------------------------------------- Problem List Details Patient Name: Date of Service: [name] [name], [name] [name] 01/26/2020 10:30 AM Medical Record Number:[id number] Patient Account Number: [id number] Date of Birth/Sex: Treating RN: 1951-02-25 (69 y.o. 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 02/24/2015 No Yes of calf and inflammation I87.331 Chronic venous hypertension (idiopathic) with ulcer 10/04/2016 No Yes and inflammation of right lower extremity L89.616 Pressure-induced deep tissue damage of right heel 12/14/2019 No Yes Inactive Problems ICD-10 Code Description Active Date Inactive Date 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/19/2016 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 F) Primary Care Provider: Other Clinician: [name] Referring Provider: Treating Provider/Extender:Anelisse Jacobson, Leotis Shames, MARY Weeks in Treatment: [phone] Subjective History of Present Illness (HPI) this is a patient who initially came to [place] for wounds on the medial malleoli bilaterally as well as her upper medial lower extremities bilaterally. These wounds eventually healed with assistance of Apligraf's bilaterally. While this was occurring she developed the current wound which opened into a fairly substantial wound on the right lower extremity. These are mostly secondary to venous stasis physiology however the patient also has underlying scleroderma, pulmonary hypertension. The wound has been making good progress lately with the Hydrofera Blue-based dressing. 03/17/2015; patient had a arterial  evaluation a year ago. Her right ABI was 0.86 left was 1.0. Toe brachial index was 0.41 on the right 0.45 on the left. Her bilateral common femoral artery waveforms were triphasic. Her white popliteal posterior tibial artery and anterior tibial artery waveforms were monophasic with good amplitude. Luteal artery waveforms were biphasic it was felt that her bilateral great toe pressures are of normal although adequate for tissue healing. 03/24/2015. The condition of this wound is not really improved that. He was covered with as fibrinous surface slough and eschar. This underwent an aggressive debridement with both a curette and scalpel. I still cannot really get down to what I can would consider to be a viable surface. There is no evidence of infection. Previous workup for ischemia roughly a year ago was negative nevertheless I think that continues to be a concern 04/07/15.  The patient arrived for application of second Apligraf. Once again the surface of this wound is certainly less viable than I would like for an advanced treatment option. An aggressive debridement was done. She developed some arteriolar bleeding which required that along pressure and silver alginate. 04/21/15: change in the condition of this wound. Once again it is covered in a gelatinous surface slough. After debridement today surface of the wound looks somewhat better but now a heeling surface 05/05/15 Apligraf #4 applied.Still a lot of slough on this wound. 05/19/15 Apligraf #5 applied. Still a lot of slough on this wound I did not aggressively remove this 06/02/15; continued copious amounts of surface slough. This debridement fairly easily. 06/08/2015 -- the last time she had a venous duplex study done was over 3 years ago and the surgery was prior to that. I have recommended that she sees Dr. early for a another opinion regarding a repeat venous duplex and possibly more endovenous ablation of vein stripping of  micro-phlebotomies. 06/16/15; wound has a gelatinous surface eschar that the debridement fairly easily to a point. I don't disagree with the venous workup and perhaps even arterial re-evaluation. She is on prednisone 5 mg and continue his medications for her pulmonary artery hypertension I am not sure if the latter have any wound care healing issues I would need to investigate this. 06/23/15 continues with a gelatinous surface eschar with of fibrinous underlying. What I can see of this wound does not look unhealthy however I just can't get this material which I think is 2 different layers off. Empiric culture done 06/30/15; unfortunately not a lot of change in this wound. A gelatinous surface eschar is easily removed however it has a tight fibrinous surface underneath the. 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 The wound comes back and basically and unchanged state the. She has a gelatinous surface that is more easily removed however there is a tightly fibrinous surface underneath the. There is no evidence of infection. She has a vascular follow-up next month. I would have to inject her in order to do a more aggressive debridement of this area 07/21/15 the wound is roughly in the same state albeit the debridement is done with greater ease. There is less of the fibrinous eschar underneath the. There is no evidence of infection. She has follow-up with vascular surgery next week. No evidence of surrounding infection. Her original distal wounds healed while this one formed. 07/28/15; wound is easier to debride. No wound erythema. She is seeing Dr. [name] next week. 08/18/15 Has been to [facility] for repeat ablation. Have been using medihoney pad with some improvement 08/25/15; absolutely no change in the condition of this wound in either its overall size or surface condition in many months now. At one point I had this down to [place] healthier surface I think with [facility] however  this did not actually progress towards closure. Do not believe that the wound has actually deteriorated in terms of volume at all. We have been using a medihoney pad which allows easier debridement of the gelatinous eschar but again no overall actual improvement. the patient is going towards an ablation with pain and vascular which I think is scheduled for next week. The only other investigation that I could first see would be a biopsy. She does have underlying scleroderma 09/02/15 eschar is much easier to debridement however the base of this does not look particularly vibrant. We changed to Iodoflex. The patient  had her ablation earlier this week 09/09/15 again the debridement over the base of this wound is easier and the base of the wound looks considerably better. We will continue the Iodoflex. Dr. Tawni Millers has expressed his satisfaction with the result of her ablation 09/15/15 once again the wound is relatively free of surface eschar. No debridement was done today. It has a pale- looking base to it. although this is not as deep as it once was it seems to be expanding especially inferiorly. She has had recent venous ablation but this is no closer to healing.I've gone ahead and done a punch biopsy this from the inferior part of the wound close to normal skin 09/22/15: the wound is relatively free of surface eschar. There is some surrounding eschar. I'm not exactly sure at what level the surface is that I am seeing. 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 No evidence of atypical infection, malignancy. 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 No debridement was done 09/29/15; the wound surface is relatively free of surface eschar. There is eschar surrounding the walls of the wound. Aside from the improvement in the amount of surface slough. This wound has not progressed any towards closure. There is not even a surface that looks like there at  this is ready. There is no evidence of any infection nor maligancy based on biopsy I did on 9/15. I continued with the Iodoflex however I am looking towards some alternative to try to promote some closure or filling in of this surface. Consider triple layer Oasis. Collagen did not result in adequate control of the surface slough 10/13/15; the patient was in hospital last week with severe anemia. The wound looks somewhat better after debridement although there is widening medially. There is no evidence of infection. 10/20/15; patient's wound on the right lateral lower leg is essentially unchanged. This underwent a light surface debridement and in general the debridement is easier and the surface looks improved. I noted in doing this on the side of the wound what appears to be a piece of calcium deposition. The patient noted that she had previous things on the tips of her fingers. 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 She follows with rheumatology and I have asked them to talk to her about this. In view of that S the nonhealing ulcer may have something to do with calcinosis and also unrelieved Raynaud's disease in this area. I should note that her original wounds on the right and left medial malleolus and the inner aspects of both legs just below the knees did however heal over 10/30/15; the patient's wound on the right lateral lower leg is essentially unchanged. I was able to remove some calcified material from the medial wound edge. 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 Otherwise the wound appears essentially unchanged there is less adherent surface eschar. Some of the calcified material was sent to pathology for analysis 11/17/15. The patient's wound on the right lateral leg is essentially unchanged. Wider Medially. 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 There is some surface slough but I did not debridement this. No evidence of surrounding infection. The wound has calcinosis and  ossification which may be connected with her underlying scleroderma. This will make healing difficult 12/15/15; the wound has less depth surface has a fibrinous slough and calcifications in the wound edge no evidence of infection 12/22/15; the wound definitely has less Fibrinous slough on the base. Calcifications around the wound edges are still evident. Although the wound bed looks healthier it is still pale in appearance. Previous biopsy did not show malignancy 01/04/15; surgical debridement of nonviable slough and subcutaneous tissue the wound cleans up quite nicely but appears to be expanding outward calcifications around the wound edges are still there. 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 She is to see her rheumatologist I'll see if he has any opinions on this. My punch biopsy done in September did not show calcifications although these are clearly evident. 01/19/16 light selective debridement of nonviable surface slough. There is epithelialization medially. This gives me reason for cautious optimism. She has been to see her rheumatologist, there is nothing that can be done for this type of soft tissue calcification associated with scleroderma 02/02/16 no debridement although there is a light surface slough. She has 2 peninsulas of skin 1 inferiorly and one medially. We continue to make a slight and slow but definite progressive here 02/16/16; light surface debridement with more attention to the circumference of the wound bed where the fibrinous eschar is more prevalent. No calcifications detected. She  seems to have done nicely with the [facility] with some epithelialization and some improvement in the overall wound volume. 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 Continued attention to the circumference of the wound where the fibrinous eschar in calcinosis or prevalent. No calcifications were detected. She has continued improvement with Hydrofera Blue. The wound is no longer as deep 03/15/16 surgical debridement done to remove surface escha Especially around the circumference of the wound where there is nonviable subcutaneous tissue. In spite of this there is considerable improvement in the overall dimensions and depth of the wound. Islands of epithelialization are seen especially medially inferiorly and superiorly to a lesser extent. She is using Hydrofera Blue at home 03/29/16; surgical debridement done to remove surface eschar and nonviable subcutaneous tissue. This cleans up quite nicely mention slightly larger in terms of length and width however depth is less 04/12/16; continued gradual improvement in terms of depth and the condition of the wound base. Debridement is done. Continuing long standing Hydrofera Blue at home with Kerlix Coban wraps 04/26/16; continued gradual improvement in terms of depth and management as well as condition of the wound base. Surgical debridement done she continues with Hydrofera Blue. 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 She initially came to this clinic venous insufficiency ulcers which have long since healed 05/17/16 continued improvement in terms of the depth and measurements of this wound although she has a tightly adherent fibrinous slough each time. We've been continued with long standing Hydrofera Blue which seems to done as well for this wound is many advanced treatment  options. 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 She also has chronic venous insufficiency. She has an irritation on her lateral right ankle secondary to our wraps 05/10/16; wound appears to be smaller especially on the medial aspect and especially in the width. Wound was debridement surface looks better. She is also been in  the hospital apparently with anemia again she tells me she had an endoscopy. Since she got home after 3 days which I believe was sometime last week she has had an irritated painful area on the right lateral ankle surrounding the lateral malleolus 05/31/16; much more adherent surface slough today then recently although I don't think the dimensions of changed that much. A more aggressive debridement is required. The irritated area over her medial malleolus is more pruritic and painful and I don't think represents cellulitis 06/14/16; no major change in her wound dimensions however there is more tightly adherent surface slough which is disappointing. As well as she appears to have a new small area medially. Furthermore an irritated uncomfortable area on the lateral aspect of her right foot just below the lateral malleolus. 06/21/16; I'm seeing the patient back in follow-up for the new areas under her major wound on the right anterior leg. She has been using Hydrofera Blue to this area probably for several months now and although the dressings seem to be helping for quite a period of time I think things have stagnated lately. She comes in today with a relatively tight adherent surface slough and really no changes in the wound shape or dimension. The 2 small areas she had inferiorly are tiny but still open they seem improved this well. There is no uncontrolled edema and I don't think there is any evidence of cellulitis. 07/05/16; no major change in this lady's large anterior right leg wound which I think is secondary to calcinosis which in turn is related  to scleroderma. Patient has had vascular evaluations both venous and arterial. I have biopsied this area. There is no obvious infection. The worrisome thing today is that she seems to be developing areas of erythema and epithelial damage on the medial aspect of the right foot. Also to a lesser degree inferior to the actual wound itself. Again I see no obvious changes to suggest cellulitis however as this is the only treatable option I will probably give her antibiotics. 07/13/16 no major change in the lady's large anterior right leg wound. Still covered with a very tightly adherent surface slough which is difficult to debridement. There is less erythema around this, culture last week grew pseudomonas I gave her ciprofloxacin. The area on her lateral right malleolus looks better- 08/02/16 the patient's wounds continued to decline. Her original large anterior right leg wound looks deeper. Still adherent surface slough that is difficult to debridement. She has a small area just below this and a punched-out wound just below her lateral malleolus. In the meantime she is been in hospital with apparently an upper GI bleed on Plavix and aspirin. She is now just on Plavix she received 3 units of packed cells 08/23/16; since I last saw this 3 weeks ago, the open large area on her right leg looks about the same syrup. She has a small satellite lesion just underneath this. The area on her medial right ankle is now a deep necrotic wound. I attempted to debridement this however there is just too much pain. 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 She follows with vascular surgery and is scheduled for an angiogram in early September 09/06/16; the patient is going for an arteriogram tomorrow. Her original large wound on the right calf is about the same the satellite lesion underneath it is about the same however the area on her medial ankle is now  deeper with exposed tendon. I am no longer attempting to debride these wounds 09/20/16; the patient has undergone a right femoral endarterectomy and Dacron patch angioplasty. This seems to have helped the flow in her right leg. 10/04/16; Arrived today for aggressive debridement of the wounds on her right calf the original wound the one beneath it and a difficult area over her right lateral leg just above the lateral malleolus. 10/25/16; her 3 open wounds are about the same in terms of dimensions however the surface appears a lot healthier post debridement. Using Iodoflex 10/18/16 we have been using Iodoflex to her wounds which she tolerated with some difficulty. 10/11/16; has been using Santyl for a period of time with some improvement although again very adherent surface slough would prevent any attempted healing this. She has a original wound on the left calf, the satellite underneath that and the most recent wound on the right medial ankle. She has completed revascularization by Dr. early and has had venous ablation earlier. Want to go back to Iodoflex to see if week and get a healthier surface to this wound bed failing this I think she'll need to be taken to the OR and I am prepared to call Dr. Marla Roe to discuss this. She is obviously not a good candidate for general anesthesia however.; 11/08/16; I put her on Iodoflex last time to see if I can get the wound bed any healthier and unfortunately today still had tremendous surface slough. 11/15/16; 4 weeks' worth of Iodoflex with not much improvement. Debridement on the major wounds on her left anterior leg is easier however this does not maintain from week to week. The punched-out area on her medial right ankle 11/29/16; I attempted to change the patient last visit to [facility] however she states this burned and was very uncomfortable therefore we gave her permission to go back to the eye out of complex which she already had at home.  Also she noted a lot of pain and swelling on the lateral aspect of her leg before she traveled to [facility] for the holidays, I called her in doxycycline over the phone. This seems to have helped 12/06/16; Wounds unchanged by in large. Using Iodoflex 12/13/16; her wounds today actually looks somewhat better. The area on the right lateral lower leg has reasonably healthy-looking granulation and perhaps as actually filled and a bit. Debridement of the 2 wounds on the medial calf is easier and post debridement appears to have a healthier base. We have referred her to Dr. Migdalia Dk for consideration of operative debridement 12/20/16; we have a quick note from Dr. Merri Ray who feels that the patient needs to be referred to an academic center/plastic surgeon. This is due to the complexity of the patient's medical issues as it applies to anything in the OR. We have been using Iodoflex 12/27/16; in general the wounds on the right leg are better in terms of the difficult to remove surface slough. She has been using Iodoflex. She is approved for Apligraf which I anticipated ordering in the next week or 2 when we get a better-looking surface 01/04/16 the deep wounds on the right leg generally look better. Both of them are debrided further surface slough. The area on the lateral right leg was not debrided. She is approved for Apligraf I think I'll probably order this either next week or the week after depending on the surface of the wounds superiorly. We have been using Iodoflex which will continue until then 01/11/16; the deep  wounds on the right leg again have a surface slough that requires debridement. I've not been able to get the wound bed on either one of these wounds down to what would be acceptable for an advanced skin stab- like Apligraf. The area on the medial leg has been improving. We have been using hot Iodoflex to all wounds which seems to do the best at at least limiting the  nonviable surface 01/24/17; we have continued Iodoflex and all her wound areas. Her debridement Gen. he is easier and the surface underneath this looks viable. Nevertheless these are large area wounds with exposed muscle at least on the anterior parts. We have ordered Apligraf's for 2 weeks from now. The patient will be away next week 02/07/17; the patient was close to have first Apligraf today however we did not order one. I therefore replaced her Iodoflex. She essentially has 3 large punched-out areas on her right anterior leg and right medial ankle. 02/11/17; Apligraf #1 02/25/17; Apligraf #2. In general some improvement in the right medial ankle and right anterior leg wounds. The larger wound medially perhaps some better 03/11/17; Apligraf #3. In general continued improvement in the right medial ankle and the right anterior leg wounds. 03/25/17 Apligraf #4. In general continued improvement especially on the right medial ankle and the lower 04/08/17; Apligraf #5 in general continued improvement in all wound sites. 04/22/17; post Apligraf #5 her wound beds continued to look a lot better all of them up to the surface of the surrounding skin. Had a caramel-colored slough that I did not debrided in case there is residual Apligraf effect. The wounds are as good as I have seen these looking quite some time. 04/29/17; we applied Byram and last week after completing her Apligraf. Wounds look as though they've contracted somewhat although they have a nonviable surface which was problematic in the past. Apparently she has been approved for further Apligraf's. We applied Iodosorb today after debridement. 05/06/17; we're fortunate enough today to be able to apply additional Apligraf approved by her insurance. In general all of her wounds look better Apligraf #6 05/24/17; Apligraf #7 continued improvement in all wounds o3 06/10/17 Apligraf #8. Continued improvement in the surface of all wounds. Not much of an  improvement in dimensions as I might a follow 06/24/17 Apligraf #9 continued general improvement although not as much change in the wound areas I might of like. She has a new open area on the right anterior lateral ankle very small and superficial. She also has a necrotic wound on the tip of her right index finger probably secondary to severe uncontrolled Raynaud's phenomenon. She is already on sildenafil and already seen her rheumatologist who gave her Keflex. 07/08/17; Apligraf #10. Generalized improvement although she has a small additional wound just medial to the major wound area. 07/22/17; after discussion we decided not to reorder any further Apligraf's although there is been considerable improvement with these it hasn't been recently. The major wound anteriorly looks better. Smaller wounds beneath this and the more recent one and laterally look about the same. The area on the right lateral lower leg looks about the same 07/29/17 this is a patient who is exceedingly complex. She has advanced scleroderma, crest syndrome including calcinosis, PAD status post revascularization, chronic venous insufficiency status post ablations. She initially presented to this clinic with wounds on her bilateral lower legs however these closed. More recently we have been dealing with a large open area superiorly on the right anterior leg, a  smaller wound underneath this and then another one on the right medial lower extremity. These improve significantly with 10 Apligraf applications. Over the last 2-3 weeks we are making good progress with Hydrofera Blue and these seem to be making progress towards closure 08/19/17; wounds continued to make good improvement with Hydrofera Blue and episodic aggressive debridements 08/26/17; still using Hydrofera Blue. Good improvements 09/09/17; using Hydrofera Blue continued improvement. Area on the lateral part of her right leg has only a small remaining open area. The small  inferior satellite region is for all intents and purposes closed. Her major wound also is come in in terms of depth and has advancing epithelialization. 09/16/17; using Hydrofera Blue with continued improvement. The smaller satellite wound. We've closed out today along with a new were satellite wound medially. The area on her medial ankle is still open and her major wound is still open but making improvement. All using Hydrofera Blue. Currently 09/30/17; using Hydrofera Blue. Still a small open area on the lateral right ankle area and her original major wound seems to be making gradual and steady improvement. 10/14/17; still using Hydrofera Blue. Still too small open areas on the right lateral ankle. Her original major wound is horizontal and linear. The most problematic area paradoxically seems to be the area to the medial area wears I thought it would be the lateral. 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 10/28/17; still using Hydrofera Blue. Right lateral ankle has a very small open area superiorly on the most lateral part of the wound. Her original open wound has 2 open areas now separated by normal skin and we've redefined this. 11/11/17; still using Hydrofera Blue area and right lateral ankle continues to have a small open area on mostly the lateral part of the wound. Her original wound has 2 small open areas now separated by a considerable amount of normal skin 11/28/17; the patient called in slightly before Thanksgiving to report pain and erythema above the wound on the right leg. In the past this is responded well to treatment for cellulitis and I gave her over the phone doxycycline. She stated this resulted in fairly abrupt improvement. We have been using Hydrofera Blue for a prolonged period of time to the larger wound anteriorly into the remaining wound on her right right lateral ankle. The latter is just about closed with only a  small linear area and the bottom of the [place]. 12/02/17; use endoform and left the dressing on since last visit. There is no tenderness and no evidence of infection. 12/16/17; patient has been using Endoform but not making much progress. The 2 punched out open areas anteriorly which were the reminiscence of her major wound appear deeper. The area on the lateral aspect of her right calf also appears deeper. Also she has a puzzling tender swelling above her wound on the right leg. This seems larger than last time. Just above her wounds there appears to be some fluctuance in this area it is not erythematous and there is no crepitus 12/30/17; patient has been using Endoform up until last week we used Hydrofera Blue. Ultrasound of the swelling above her 2 major wounds last time was negative for a fluid collection. I gave her cefaclor for the erythema and tenderness in this area which seems better. Unfortunately both punched out areas anteriorly and the area on her right medial lower leg appear deeper. In fact the lateral of the wounds anteriorly actually looks  as though it has exposed tendon and/or muscle sheath. She is not systemically unwell. She is complaining of vaginitis type symptoms presumably Candida from her antibiotics. 01/06/18; we're using santyl. she has 2 punched out areas anteriorly which were initially part of a large wound. Unfortunately medially this is now open to tendon/muscle. All the wounds have the same adherent very difficult to debride surface. 01/20/18; 2 week follow-up using Santyl. She has the 2 punched out areas anteriorly which were initially part of her large surface wound there. Medially this still has exposed muscle. All of these have the same tightly adherent necrotic surface which requires debridement. PuraPly was not accepted by the patient's insurance however her insurance I think it changed therefore we are going to run Apligraf to gain 02/03/18; the patient has been  using Santyl. The wound on the right lateral ankle looks improved and the 2 areas anteriorly on the right leg looks about the same. The medial one has exposed muscle. The lateral 1 requiresdebridement. We use PuraPly today for the first week 02/10/18; PuraPly #2. The patient has 3 wounds. The area on the right lateral ankle, 2 areas anteriorly that were part of her original large wound in this area the medial one has exposed muscle. All of the wounds were lightly debrided with a number 3 curet. PuraPly #2 applied the lateral wound on the calf and the right lateral ankle look better. 02/17/18; PuraPly #3. Patient has 3 wounds. The area on the right lateral ankle in 2 areas internally that were part of her original large wound. The lateral area has exposed muscle. She arrived with some complaints of pain around the right ankle. 02/24/18; PuraPly #4; not much change in any of the 3 wound areas. Right lateral ankle, right lateral calf. Both of these required debridement with a #3 curet. She tolerates this marginally. The area on the medial leg still has exposed muscle. Not much change in dimensions 03/03/18 PuraPly #5. The area on the medial ankle actually looks better however the 2 separate areas that were original parts of the larger right anterior leg wound look as though they're attempting to coalesce. 03/10/18; PuraPly #6. The area on the medial ankle actually continues to look and measures smaller however the 2 separate areas that were part of the original large wound on the right anterior leg have now coalesced. There hasn't been much improvement here. The lateral area actually has underlying exposed muscle 03/17/18-she is here in follow-up evaluation for ulcerations to the right lower extremity. She is voicing no complaints or concerns. She tolerated debridement. Puraply#7 placed 03/24/18; difficult right lower extremity ulcerations. PuraPly #8 place. She is been approved for [employer]. She did very  well with Apligraf today however she is apparently reached her "lifetime max" 03/31/18; marginal improvement with PuraPly although her wounds looked as good as they have in several weeks today. Used TheraSkins #1 04/14/18 TheraSkin #2 today 04/28/18 TheraSkins #3. Wound slightly improved 05/12/18; TheraSkin skin #4. Wound response has been variable. 05/27/18 TheraSkin #5. Generally improvement in all wound areas. I've also put her in 3 layer compression to help with the severe venous hypertension 06/09/18; patient is done quite well with the TheraSkins unfortunately we have no further applications. I also put her in 3 layer compression last week and that really seems to of helped. 06/16/18; we have been using silver collagen. Wounds are smaller. Still be open area to the muscle layer of her calf however even that is contracted somewhat. She tells  me that at night sometimes she has pain on the right lateral calf at the site of her lower wound. Notable that I put her into 3 layer compression about 3 weeks ago. She states that she dangles her leg over the bed that makes it feel better but she does not describe claudication during the day ooShe is going to call her secondary insurance to see if they will continue to cover advanced treatment products I have reviewed her arterial studies from 01/22/17; this showed an ABI in the right of 1 and on the left noncompressible. TBI on the right at 0.30 on the left at 0.34. It is therefore possible she has significant PAD with medial calcification falsely elevating her ABI into the normal range. I'll need to be careful about asking her about this next week it's possible the 3 layer compression is too much 06/23/18; was able to reapply TheraSkin 1 today. Edema control is good and she is not complaining of pain no claudication 07/07/18;no major change. New wound which was apparently a taper removal injury today in our clinic between her 2 wounds on the right  calf TheraSkins #2 07/14/18; I think there is some improvement in the right lateral ankle and the medial part of her wound. There is still exposed muscle medially. 07/28/18; two-week follow-up. TheraSkins#3. Unfortunately no major change. 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 She seems to be doing well and in the base of the wound appears to show some progress at this point. This is the last approved Theraskin of the second set. 08/25/18; she has completed TheraSkin. There has been some improvement on the right lateral calf wound as well as the anterior leg wounds. The open area to muscle medially on the anterior leg wound is smaller. I'm going to transition her back to [facility] under Kerlix Coban change every second day. She reports that she had some calcification removed from her right upper arm. We have had previous problems with calcifications in her wounds on her legs but that has not happened recently 09/08/18;using Hydrofera Blue on both her wound areas. Wounds seem to of contracted nicely. She uses Kerlix Coban wrap and changes at home herself 09/22/2018; using Hydrofera Blue on both her wound areas. Dimensions seem to have come down somewhat. There is certainly less depth in the medial part of the mid tibia wound and I do not think there is any exposed muscle at this point. 10/06/2018; 2-week follow-up. Using [facility] on her wound areas. Dimensions have come down nicely both on the right lateral ankle area in the right mid tibial area. She has no new complaints 10/20/2018; 2-week follow-up. She is using Hydrofera Blue. Not much change from the last time she was here. The area on the lateral ankle has less depth although it has raised edges on one side. I attempted to remove as much  of the raised edge as I could without creating more additional wounds. The area on the right anterior mid tibia area looks the same. 11/03/2018; 2-week follow-up she is using Hydrofera Blue. On the right anterior leg she now has 2 wounds separated by a large area of normal skin. The area on the medial part still has I think exposed muscle although this area itself is a lot smaller. The area laterally has some  depth. Both areas with necrotic debris. ooThe area on her right lateral ankle has come in nicely 11/17/2018; patient continues to use Hydrofera Blue. We have been increasing separation of the 2 wounds anteriorly which were at one point joint the area on the right lateral calf continues to have I think some improvement in depth. 12/08/2018; patient continues to use Hydrofera Blue. There is some improvement in the area on the right lateral calf. The 2 areas that were initially part of the original large wound in the mid right tibia are probably about the same. In fact the medial area is probably somewhat larger. We will run puraply through the patient's insurance 12/22/2018; she has been using Hydrofera Blue. We have small wounds on the right lateral calf and 2 small areas that were initially part of a large wound in the right mid tibia. We applied pure apply #1 today. 12/29/2018; we applied puraply #2. Her wounds look somewhat better especially on the right lateral calf and the lateral part of her original wound in the mid tibial area. 01/05/2019; perhaps slightly improved in terms of wound bed condition but certainly not as much improvement as I might of liked. Puraply #3 1/13: we did not have a correct sized puraply to apply. wounds more pinched out looking, I increased her compression to 3 layer last week to help with significant multilevel venous hypertension. Since then I've reviewed her arterial status. She has a right femoral endarterectomy and a distal left SFA stent. She was being  followed by Dr. [name] for a period however she does not appear to have seen him in 3 years. I will set up an appointment. 1/20. The patient has an additional wound on the right lateral calf between the distal wound and proximal wounds. We did not have Puraply last week. Still does not have a follow-up with Dr. early 1/27: Follow-up with Dr. early has been arranged apparently with follow-up noninvasive studies. Wounds are measuring roughly the same although they certainly look smaller 2/3; the patient had non invasive studies. Her ABIs on the right were 0.83 and on the left 1.02 however there was no great toe pressure bilaterally. Also worrisome monophasic waveforms at the PTA and dorsalis pedis. We are still using Puraply. We have had some improvement in all of the wounds especially the lateral part of the mid tibial area. 2/10; sees Dr. early of vein and vascular re-arterial studies next week. Puraply reapplied today. 2/17; Dr. early of vein and vascular his appointment is tomorrow. Puraply reapplied after debridement of all wounds 2/24; the patient saw Dr. early I reviewed his note. sHe noted the previous right femoral endarterectomy with a Dacron patch. He also noted the normal ABI and the monophasic waveforms suggesting tibial disease. Overall he did not feel that she had any evidence of arterial insufficiency that would impair her wound healing. He did note her venous disease as well. He suggested PRN follow-up. 3/2; I had the last puraply applied today. The original wounds over the mid tibia area are improved where is the area on the right lower leg is not 3/9; wounds are smaller especially in the right mid tibia perhaps slightly in the right lateral calf. We finished with puraply and went to endoform today 3/23; the patient arrives after 2 weeks. She has been using endoform. I think all of her wounds look slightly better which includes the area on the right lateral calf just above the  right lateral malleolus and the 2 in the  right mid tibia which were initially part of the same wound. 4/27; TELEHEALTH visit; the patient was seen for telehealth visit today with her consent in the middle of the worldwide epidemic. Since she was last here she called in for antibiotics with pain and tenderness around the area on the right medial ankle. I gave her empiric doxycycline. She states this feels better. She is using endoform on both of these areas 5/11 TELEHEALTH; the patient was seen for telehealth visit today. She was accompanied at home by her husband. She has severe pulmonary hypertension accompanied scleroderma and in the face of the Covid epidemic cannot be safely brought into our clinic. We have been using endoform on her wound areas. There are essentially 3 wound areas now in the left mid tibia now 2 open areas that it one point were connected and one on the right lateral ankle just above the malleolus. 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 She cannot come to clinic. The wounds were reviewed today via telehealth. She has severe chronic venous hypertension which I think is centrally mediated. She has wounds on her right anterior tibia and right lateral ankle area. These are chronic. She has been using endoform. 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 We have been following her from telehealth. She has severe chronic venous hypertension which may be mostly centrally mediated secondary to right heart heart failure. She has wounds on her right anterior tibia and right lateral ankle these are chronic we have been using endoform 6/22; TELEHEALTH; this patient was seen  today via telehealth. 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 Since the last time we had contact with this patient she developed some pain and erythema around the wound on her right lateral malleolus/ankle and we put in antibiotics for her. This is resulted in good improvement with resolution of the erythema and tenderness. I changed her to silver alginate last time. We had been using endoform for an extended period of time 7/13; TELEHEALTH; this patient was seen today via telehealth. 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 She would continue to be at a prohibitive risk to be brought into our clinic unless this was absolutely necessary. These visits have been done with her approval as well as her husband. We have been using silver alginate to the areas on the right mid tibia and right lateral lower leg. 7/27 TELEHEALTH; patient was seen along with her husband today via telehealth. 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 We changed her to sample last visit. She has 2 areas a chronic wound on her right mid tibia and one just above her ankle. These were not the original wounds when she came into this clinic but she developed them during treatment 8/17; she comes in for her first face-to-face visit in a long period. She has a remaining area just medial to the right tibia which is the last open part of her large wound across this area. She also has an area on the right lateral lower  leg. We prescribed Santyl last telehealth visit but they were concerned that this was making a deeper so they put silver alginate on it last week. Her husband changes the dressings. 8/31; using Santyl to the 2 wound areas some improvement in wound surfaces. Husband has surgery in 2 weeks we will put her  out 3 weeks. Any of the advanced treatment options that I can think of that would be eligible for this wound would also cause her to have to come in weekly. The risk that the patient is just too high 9/21. Using Santyl to the 2 wound areas. Both of these are somewhat better although the medial mid tibia area still has exposed muscle. Lateral ankle requiring debridement. Using Santyl 10/12; using Santyl to the 2 wound areas. One on the right lateral ankle and the other in the medial calf which still has exposed muscle. Both areas have come down in size and have better looking surfaces. She has made nice progress with santyl 11/2; we have been using Santyl to the 2 wound areas. Right lateral ankle and the other in the mid tibia area the medial part of this still has exposed muscle. 11/23/2019 on evaluation today patient appears to be doing about the same really with regard to her wounds. She is actually not very pleased with how things seem to be progressing at this point she tells me that she really has not noted much improvement unfortunately. With that being said there is no signs of active infection at this time. There is some slough buildup noted at this time which again along with some dry skin around the edges of the wound I think would benefit her to try to debride some of this away. Fortunately her pain is doing fairly well. She still has exposed muscle in the right medial/tibial area. 12/14; TELEHEALTH; she was changed to [facility] to the right calf wound and right lateral ankle wound when she was here last time. Unfortunately since then she had a fall with a pelvic fracture and a fracture of her wrist. She was apparently hospitalized for 5 days. I have not looked at her discharge summary. She apparently came out of the hospital with a blister on her right heel. She was seen today via telehealth by myself and our case manager. The patient and her husband were present. She has been  using Hydrofera Blue at our direction from the last time she was in the clinic. There is been no major improvement in fact the areas appear deeper and with a less viable surface 01/04/2020; TELEHEALTH; the patient was seen today in accompaniment of her husband and our nurse. She has 3 open areas 1 on the right medial mid tibia, one on the right lateral ankle and a large eschared pressure area on the right heel. We have been using Iodoflex to the 2 original wounds. 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 It is simply too perilous for her to be seen in any other way 1/26; TELEHEALTH; the patient was seen today in accompaniment of her husband and our RN one of our nurses. She still has the 3 open areas 1 on the right medial mid tibia which is the remanent of a more extensive wound in this area, one on the right lateral ankle and a large eschared pressure area on the right heel. We have been using Iodoflex to the 2 original wounds and a bed at 9 application to the eschared area on the heel  Patient History Information obtained from Patient. Allergies Sulfa (Sulfonamide Antibiotics) (Severity: Severe, Reaction: rash), Levaquin (Severity: Severe, Reaction: flu like symptoms), penicillin (Severity: Severe, Reaction: rash) Social History Former smoker, Marital Status - Married, Alcohol Use - Moderate, Drug Use - No History. Medical History Ear/Nose/Mouth/Throat Denies history of Chronic sinus problems/congestion, Middle ear problems Hematologic/Lymphatic Patient has history of Anemia Denies history of Hemophilia, Human Immunodeficiency Virus, Lymphedema, Sickle Cell Disease Respiratory Denies history of Aspiration, Asthma, Chronic Obstructive Pulmonary Disease (COPD), Pneumothorax, Sleep Apnea, Tuberculosis Cardiovascular Patient has history of Hypertension, Peripheral Arterial Disease, Peripheral Venous Disease Denies history of Angina, Arrhythmia, Congestive  Heart Failure, Coronary Artery Disease, Deep Vein Thrombosis, Hypotension, Myocardial Infarction, Phlebitis, Vasculitis Gastrointestinal Denies history of Cirrhosis , Colitis, Crohnoos, Hepatitis A, Hepatitis B, Hepatitis C Endocrine Denies history of Type I Diabetes, Type II Diabetes Genitourinary Denies history of End Stage Renal Disease Immunological Patient has history of Raynaudoos, Scleroderma Denies history of Lupus Erythematosus Integumentary (Skin) Denies history of History of Burn Musculoskeletal Patient has history of Rheumatoid Arthritis, Osteoarthritis Denies history of Gout, Osteomyelitis Neurologic Denies history of Dementia, Neuropathy, Quadriplegia, Paraplegia, Seizure Disorder Oncologic Denies history of Received Chemotherapy, Received Radiation Psychiatric Denies history of Anorexia/bulimia, Confinement Anxiety Hospitalization/Surgery History - cardiac cath. - right 2nd finger ray amp and tendon lengthening. - Fall w/wrist and pelvic fx 11/2019. Medical And Surgical History Notes Respiratory O2 dependent, pulmonary hypertension Objective Integumentary (Hair, Skin) Wound #16 status is Open. Original cause of wound was Pressure Injury. The wound is located on the Right Calcaneus. The wound measures 4cm length x 4cm width x 0.1cm depth; 12.566cm^2 area and 1.257cm^3 volume. There is no tunneling or undermining noted. There is a none present amount of drainage noted. The wound margin is flat and intact. There is no granulation within the wound bed. There is a large (67-100%) amount of necrotic tissue within the wound bed including Eschar. Wound #5 status is Open. Original cause of wound was Gradually Appeared. The wound is located on the Right,Medial Lower Leg. The wound measures 1.2cm length x 0.9cm width x 0.9cm depth; 0.848cm^2 area and 0.763cm^3 volume. There is muscle and Fat Layer (Subcutaneous Tissue) Exposed exposed. There is no tunneling or undermining  noted. There is a medium amount of serosanguineous drainage noted. The wound margin is well defined and not attached to the wound base. There is no granulation within the wound bed. There is a large (67- 100%) amount of necrotic tissue within the wound bed including Adherent Slough. Wound #7 status is Open. Original cause of wound was Gradually Appeared. The wound is located on the Right,Lateral Malleolus. The wound measures 1.2cm length x 0.6cm width x 0.3cm depth; 0.565cm^2 area and 0.17cm^3 volume. There is no tunneling or undermining noted. There is a medium amount of serosanguineous drainage noted. The wound margin is well defined and not attached to the wound base. There is no granulation within the wound bed. There is a large (67-100%) amount of necrotic tissue within the wound bed including Adherent Slough. 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 Dressing Change Frequency: Wound #16 Right Calcaneus: Change dressing every day. Wound #5 Right,Medial  Lower Leg: Change Dressing every other day. Wound #7 Right,Lateral Malleolus: Change Dressing every other day. Skin Barriers/Peri-Wound Care: Moisturizing lotion - both legs Wound Cleansing: May shower and wash wound with soap and water. Primary Wound Dressing: Wound #16 Right Calcaneus: Other: - Paint with Betadine Wound #5 Right,Medial Lower Leg: Iodoflex - may use Iodosorb gel if Iodoflex not available Wound #7 Right,Lateral Malleolus: Iodoflex - may use Iodosorb gel if Iodoflex not  available Secondary Dressing: Wound #16 Right Calcaneus: Heel Cup Wound #5 Right,Medial Lower Leg: Dry Gauze Wound #7 Right,Lateral Malleolus: Dry Gauze Edema Control: Kerlix and Coban - Right Lower Extremity Avoid standing for long periods of time Elevate legs to the level of the heart or above for 30 minutes daily and/or when sitting, a frequency of: - throughout the day Off-Loading: Turn and reposition every 2 hours Other: - float heels off of bed/chair with pillow under calves Home Health: [facility] for wound care. - [facility] 1. For now I am continuing Iodoflex to the 2 wounds on the right medial mid tibia and the right lateral malleolus. Both of these areas probably need some debridement. If we are going to heal the mid leg wound likely to need an advanced treatment product 2. The right heel is separating from what I can see it actually looked healthy. 3. I am going to probably bring her in in 2 or 3 weeks early in the morning so we can try and get her out of the clinic as quickly as Electronic Signature(s) Signed: 01/26/2020 5:32:11 PM By: Linton Ham MD Entered By: Linton Ham on 01/26/2020 13:11:22 -------------------------------------------------------------------------------- HxROS Details Patient Name: Date of Service: [name] [name] 01/26/2020 10:30 AM Medical Record Number:[id number] Patient Account Number: [id number] Date of Birth/Sex: Treating RN: January 29, 1951 (69 y.o. [name] Primary Care Provider: [name] Other Clinician: Referring Provider: Treating Provider/Extender:Deanie Jupiter, Leotis Shames, MARY Weeks in Treatment: [phone] Information Obtained From Patient Ear/Nose/Mouth/Throat Medical History: Negative for: Chronic sinus problems/congestion; Middle ear problems Hematologic/Lymphatic Medical History: Positive for: Anemia Negative for: Hemophilia; Human Immunodeficiency Virus; Lymphedema; Sickle Cell  Disease Respiratory Medical History: Negative for: Aspiration; Asthma; Chronic Obstructive Pulmonary Disease (COPD); Pneumothorax; Sleep Apnea; Tuberculosis Past Medical History Notes: O2 dependent, pulmonary hypertension Cardiovascular Medical History: Positive for: Hypertension; Peripheral Arterial Disease; Peripheral Venous Disease Negative for: Angina; Arrhythmia; Congestive Heart Failure; Coronary Artery Disease; Deep Vein Thrombosis; Hypotension; Myocardial Infarction; Phlebitis; Vasculitis Gastrointestinal Medical History: Negative for: Cirrhosis ; Colitis; Crohns; Hepatitis A; Hepatitis B; Hepatitis C Endocrine Medical History: Negative for: Type I Diabetes; Type II Diabetes Genitourinary Medical History: Negative for: End Stage Renal Disease Immunological Medical History: Positive for: Raynauds; Scleroderma Negative for: Lupus Erythematosus Integumentary (Skin) Medical History: Negative for: History of Burn Musculoskeletal Medical History: Positive for: Rheumatoid Arthritis; Osteoarthritis Negative for: Gout; Osteomyelitis Neurologic Medical History: Negative for: Dementia; Neuropathy; Quadriplegia; Paraplegia; Seizure Disorder Oncologic Medical History: Negative for: Received Chemotherapy; Received Radiation Psychiatric Medical History: Negative for: Anorexia/bulimia; Confinement Anxiety Immunizations Pneumococcal Vaccine: Received Pneumococcal Vaccination: Yes Implantable Devices No devices added Hospitalization / Surgery History Type of Hospitalization/Surgery cardiac cath right 2nd finger ray amp and tendon lengthening Fall w/wrist and pelvic fx 11/2019 Family and Social History Former smoker; Marital Status - Married; Alcohol Use: Moderate; Drug Use: No History; Financial Concerns: No; Food, Clothing or Shelter Needs: No; Support System Lacking: No; Transportation Concerns: No Engineer, maintenance) Signed: 01/26/2020 5:32:11 PM By: Linton Ham  MD Signed: 02/01/2020 6:39:44 PM By: Levan Hurst RN, BSN Entered By:  Levan Hurst on 01/26/2020 11:46:00 -------------------------------------------------------------------------------- SuperBill Details Patient Name: Date of Service: EYANA, STOLZE 01/26/2020 Medical Record Number:[id number] Patient Account Number: [id number] Date of Birth/Sex: Treating RN: 1951/12/06 (69 y.o. 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 Modifier: Quantity: 1 Physician Procedures CPT4 Code Description: [id number] - WC PHYS LEVEL 2 - EST PT ICD-10 Diagnosis Description A83.507 Non-pressure chronic ulcer of right calf with necrosis of L97.811 Non-pressure chronic ulcer of other part of right lower l skin L89.616 Pressure-induced  deep tissue damage of right heel Modifier: muscle eg limited to br Quantity: 1 eakdown of Electronic Signature(s) Signed: 01/26/2020 5:32:11 PM By: Linton Ham MD Entered By: Linton Ham on 01/26/2020 13:11:44

## 2020-02-01 NOTE — Progress Notes (Signed)
[name] [name] ([id number]) Visit Report for 01/26/2020 Allergy List Details Patient Name: Date of Service: [name] [name], [name] [name] 01/26/2020 10:30 AM Medical Record Number:[id number] Patient Account Number: [id number] Date of Birth/Sex: Treating RN: 05-Nov-1951 (69 y.o. [name] Primary Care Casmira Cramer: [name] Other Clinician: Referring Nastassja Witkop: Treating Marli Diego/Extender:Robson, Leotis Shames, MARY Weeks in Treatment: [phone] Allergies Active Allergies Sulfa (Sulfonamide Antibiotics) Reaction: rash Severity: Severe Levaquin Reaction: flu like symptoms Severity: Severe penicillin Reaction: rash Severity: Severe Allergy Notes Electronic Signature(s) Signed: 01/26/2020 5:32:11 PM By: Linton Ham MD Entered By: Linton Ham on 01/26/2020 13:07:57 -------------------------------------------------------------------------------- Wound Assessment Details Patient Name: Date of Service: [name] [name]. 01/26/2020 10:30 AM Medical Record Number:[id number] Patient Account Number: [id number] Date of Birth/Sex: Treating RN: 14-May-1951 (69 y.o. [name] Primary Care Laynie Espy: [name] Other Clinician: Referring Nilson Tabora: Treating Yuritzy Zehring/Extender:Robson, Leotis Shames, MARY Weeks in Treatment: 409 Wound Status Wound Number: 16 Primary Pressure Ulcer Etiology: Wound Location: Right Calcaneus Wound Open Wounding Event: Pressure Injury Wounding Event: Pressure Injury Status: Date Acquired: 01/04/2020 Comorbid Anemia, Hypertension, Peripheral Arterial Weeks Of Treatment: 3 History: Disease, Peripheral Venous Disease, Clustered Wound: No Raynauds, Scleroderma, Rheumatoid Arthritis, Osteoarthritis Wound Measurements Length: (cm) 4 Width: (cm) 4 Depth: (cm) 0.1 Area: (cm) 12.566 Volume: (cm) 1.257 Wound Description Classification: Unstageable/Unclassified Wound Margin: Flat and Intact Exudate Amount: None Present Wound Bed Granulation Amount: None  Present (0%) Necrotic Amount: Large (67-100%) Necrotic Quality: Eschar r After Cleansing: No ibrino No Exposed Structure ed: No ubcutaneous Tissue) Exposed: No ed: No ed: No d: No : No % Reduction in Area: 0% % Reduction in Volume: 0% Epithelialization: None Tunneling: No Undermining: No Foul Odo Slough/F Fascia Expos Fat Layer (S Tendon Expos Muscle Expos Joint Expose Bone Exposed Electronic Signature(s) Signed: 02/01/2020 6:39:44 PM By: Levan Hurst RN, BSN Entered By: Levan Hurst on 01/26/2020 11:05:21 -------------------------------------------------------------------------------- Wound Assessment Details Patient Name: Date of Service: [name] [name] 01/26/2020 10:30 AM Medical Record Number:[id number] Patient Account Number: [id number] Date of Birth/Sex: Treating RN: 07-23-1951 (69 y.o. [name] Primary Care Amelya Mabry: [name] Other Clinician: Referring Markelle Asaro: Treating Cheris Tweten/Extender:Robson, Leotis Shames, MARY Weeks in Treatment: 409 Wound Status Wound Number: 5 Primary Venous Leg Ulcer Etiology: Wound Location: Right Lower Leg - Medial Wound Open Wounding Event: Gradually Appeared Status: Date Acquired: 01/13/2013 Comorbid Anemia, Hypertension, Peripheral Arterial Weeks Of Treatment: 365 History: Disease, Peripheral Venous Disease, Clustered Wound: Yes Raynauds, Scleroderma, Rheumatoid Arthritis, Osteoarthritis Wound Measurements Length: (cm) 1.2 % Reduction Width: (cm) 0.9 % Reduction Depth: (cm) 0.9 Epitheliali Area: (cm) 0.848 Tunneling: Volume: (cm) 0.763 Underminin Wound Description Full Thickness With Exposed Support Foul Odo Classification: Structures Slough/F Wound Well defined, not attached Margin: Exudate Medium Amount: Exudate Type: Serosanguineous Exudate red, brown Color: Wound Bed Granulation Amount: None Present (0%) Necrotic Amount: Large (67-100%) Fascia Exp Necrotic Quality: Adherent Slough  Fat Layer Tendon Exp Muscle Exp Necro Joint Expo Bone Expos r After Cleansing: No ibrino Yes Exposed Structure osed: No (Subcutaneous Tissue) Exposed: Yes osed: No osed: Yes sis of Muscle: No sed: No ed: No in Area: 40% in Volume: -441.1% zation: Small (1-33%) No g: No Electronic Signature(s) Signed: 02/01/2020 6:39:44 PM By: Levan Hurst RN, BSN Entered By: Levan Hurst on 01/26/2020 11:45:04 -------------------------------------------------------------------------------- Wound Assessment Details Patient Name: Date of Service: [name] [name] 01/26/2020 10:30 AM Medical Record Number:[id number] Patient Account Number: [id number] Date of Birth/Sex: Treating RN: 30-Sep-1951 (69 y.o. [name] Primary Care Catalena Stanhope: [name] Other Clinician: Referring Treyshaun Keatts: Treating Lexani Corona/Extender:Robson, Leotis Shames, [facility]  Weeks in Treatment: 409 Wound Status Wound Number: 7 Primary Venous Leg Ulcer Etiology: Wound Location: Right Malleolus - Lateral Wound Open Wounding Event: Gradually Appeared Status: Date Acquired: 06/25/2016 Comorbid Anemia, Hypertension, Peripheral Arterial Weeks Of Treatment: 185 History: Disease, Peripheral Venous Disease, Clustered Wound: No Raynauds, Scleroderma, Rheumatoid Arthritis, Osteoarthritis Wound Measurements Length: (cm) 1.2 % Reduction Width: (cm) 0.6 % Reduction Depth: (cm) 0.3 Epitheli Area: (cm) 0.565 Tunneli Volume: (cm) 0.17 Undermi Wound Description Full Thickness Without Exposed Support Foul Od Classification: Structures Slough/ Wound Well defined, not attached Margin: Exudate Medium Amount: Exudate Serosanguineous Type: Exudate red, brown Color: Wound Bed Granulation Amount: None Present (0%) Necrotic Amount: Large (67-100%) Fascia E Necrotic Quality: Adherent Slough Fat Laye Tendon E Muscle E Joint Ex Bone Exp or After Cleansing: No Fibrino Yes Exposed Structure xposed: No r  (Subcutaneous Tissue) Exposed: No xposed: No xposed: No posed: No osed: No in Area: 77.9% in Volume: 33.3% alization: None ng: No ning: No Electronic Signature(s) Signed: 02/01/2020 6:39:44 PM By: Levan Hurst RN, BSN Entered By: Levan Hurst on 01/26/2020 11:45:35

## 2020-02-02 DIAGNOSIS — S62102D Fracture of unspecified carpal bone, left wrist, subsequent encounter for fracture with routine healing: Secondary | ICD-10-CM | POA: Diagnosis not present

## 2020-02-02 DIAGNOSIS — I739 Peripheral vascular disease, unspecified: Secondary | ICD-10-CM | POA: Diagnosis not present

## 2020-02-02 DIAGNOSIS — G629 Polyneuropathy, unspecified: Secondary | ICD-10-CM | POA: Diagnosis not present

## 2020-02-02 DIAGNOSIS — S52592D Other fractures of lower end of left radius, subsequent encounter for closed fracture with routine healing: Secondary | ICD-10-CM | POA: Diagnosis not present

## 2020-02-02 DIAGNOSIS — S32592D Other specified fracture of left pubis, subsequent encounter for fracture with routine healing: Secondary | ICD-10-CM | POA: Diagnosis not present

## 2020-02-02 DIAGNOSIS — M349 Systemic sclerosis, unspecified: Secondary | ICD-10-CM | POA: Diagnosis not present

## 2020-02-03 ENCOUNTER — Ambulatory Visit: Payer: Medicare Other | Admitting: Emergency Medicine

## 2020-02-04 DIAGNOSIS — S62102D Fracture of unspecified carpal bone, left wrist, subsequent encounter for fracture with routine healing: Secondary | ICD-10-CM | POA: Diagnosis not present

## 2020-02-04 DIAGNOSIS — S32592D Other specified fracture of left pubis, subsequent encounter for fracture with routine healing: Secondary | ICD-10-CM | POA: Diagnosis not present

## 2020-02-04 DIAGNOSIS — I7301 Raynaud's syndrome with gangrene: Secondary | ICD-10-CM | POA: Diagnosis not present

## 2020-02-04 DIAGNOSIS — S81801D Unspecified open wound, right lower leg, subsequent encounter: Secondary | ICD-10-CM | POA: Diagnosis not present

## 2020-02-04 DIAGNOSIS — S52592D Other fractures of lower end of left radius, subsequent encounter for closed fracture with routine healing: Secondary | ICD-10-CM | POA: Diagnosis not present

## 2020-02-04 DIAGNOSIS — I739 Peripheral vascular disease, unspecified: Secondary | ICD-10-CM | POA: Diagnosis not present

## 2020-02-04 DIAGNOSIS — M79672 Pain in left foot: Secondary | ICD-10-CM | POA: Diagnosis not present

## 2020-02-04 DIAGNOSIS — M810 Age-related osteoporosis without current pathological fracture: Secondary | ICD-10-CM | POA: Diagnosis not present

## 2020-02-04 DIAGNOSIS — G629 Polyneuropathy, unspecified: Secondary | ICD-10-CM | POA: Diagnosis not present

## 2020-02-04 DIAGNOSIS — D638 Anemia in other chronic diseases classified elsewhere: Secondary | ICD-10-CM | POA: Diagnosis not present

## 2020-02-04 DIAGNOSIS — I272 Pulmonary hypertension, unspecified: Secondary | ICD-10-CM | POA: Diagnosis not present

## 2020-02-04 DIAGNOSIS — M349 Systemic sclerosis, unspecified: Secondary | ICD-10-CM | POA: Diagnosis not present

## 2020-02-04 DIAGNOSIS — M340 Progressive systemic sclerosis: Secondary | ICD-10-CM | POA: Diagnosis not present

## 2020-02-04 DIAGNOSIS — K219 Gastro-esophageal reflux disease without esophagitis: Secondary | ICD-10-CM | POA: Diagnosis not present

## 2020-02-08 ENCOUNTER — Other Ambulatory Visit: Payer: Self-pay | Admitting: Internal Medicine

## 2020-02-08 ENCOUNTER — Ambulatory Visit: Payer: Medicare Other | Attending: Internal Medicine

## 2020-02-08 ENCOUNTER — Other Ambulatory Visit: Payer: Self-pay

## 2020-02-08 DIAGNOSIS — S3282XD Multiple fractures of pelvis without disruption of pelvic ring, subsequent encounter for fracture with routine healing: Secondary | ICD-10-CM | POA: Diagnosis not present

## 2020-02-08 DIAGNOSIS — M349 Systemic sclerosis, unspecified: Secondary | ICD-10-CM | POA: Diagnosis not present

## 2020-02-08 DIAGNOSIS — S52502D Unspecified fracture of the lower end of left radius, subsequent encounter for closed fracture with routine healing: Secondary | ICD-10-CM | POA: Diagnosis not present

## 2020-02-08 DIAGNOSIS — Z23 Encounter for immunization: Secondary | ICD-10-CM | POA: Insufficient documentation

## 2020-02-08 DIAGNOSIS — Z9981 Dependence on supplemental oxygen: Secondary | ICD-10-CM | POA: Diagnosis not present

## 2020-02-08 NOTE — Progress Notes (Signed)
   Covid-19 Vaccination Clinic  Name:  [name] [name]    MRN: [id number] DOB: August 13, 1951  02/08/2020  Ms. Chiu was observed post Covid-19 immunization for 15 minutes without incidence. She was provided with Vaccine Information Sheet and instruction to access the V-Safe system.   Ms. Mellott was instructed to call 911 with any severe reactions post vaccine: Marland Kitchen Difficulty breathing  . Swelling of your face and throat  . A fast heartbeat  . A bad rash all over your body  . Dizziness and weakness    Immunizations Administered    Name Date Dose VIS Date Route   Pfizer COVID-19 Vaccine 02/08/2020  8:35 AM 0.3 mL 12/11/2019 Intramuscular   Manufacturer: [place]   Lot: [id number]   [place]: [address]-1

## 2020-02-09 DIAGNOSIS — S32592D Other specified fracture of left pubis, subsequent encounter for fracture with routine healing: Secondary | ICD-10-CM | POA: Diagnosis not present

## 2020-02-09 DIAGNOSIS — S52592D Other fractures of lower end of left radius, subsequent encounter for closed fracture with routine healing: Secondary | ICD-10-CM | POA: Diagnosis not present

## 2020-02-09 DIAGNOSIS — M349 Systemic sclerosis, unspecified: Secondary | ICD-10-CM | POA: Diagnosis not present

## 2020-02-09 DIAGNOSIS — I739 Peripheral vascular disease, unspecified: Secondary | ICD-10-CM | POA: Diagnosis not present

## 2020-02-09 DIAGNOSIS — S62102D Fracture of unspecified carpal bone, left wrist, subsequent encounter for fracture with routine healing: Secondary | ICD-10-CM | POA: Diagnosis not present

## 2020-02-09 DIAGNOSIS — G629 Polyneuropathy, unspecified: Secondary | ICD-10-CM | POA: Diagnosis not present

## 2020-02-11 ENCOUNTER — Other Ambulatory Visit: Payer: Self-pay

## 2020-02-11 ENCOUNTER — Other Ambulatory Visit (INDEPENDENT_AMBULATORY_CARE_PROVIDER_SITE_OTHER): Payer: Medicare Other | Admitting: Internal Medicine

## 2020-02-11 DIAGNOSIS — M349 Systemic sclerosis, unspecified: Secondary | ICD-10-CM | POA: Diagnosis not present

## 2020-02-11 DIAGNOSIS — D649 Anemia, unspecified: Secondary | ICD-10-CM

## 2020-02-11 DIAGNOSIS — S32592D Other specified fracture of left pubis, subsequent encounter for fracture with routine healing: Secondary | ICD-10-CM | POA: Diagnosis not present

## 2020-02-11 DIAGNOSIS — S52592D Other fractures of lower end of left radius, subsequent encounter for closed fracture with routine healing: Secondary | ICD-10-CM | POA: Diagnosis not present

## 2020-02-11 DIAGNOSIS — S62102D Fracture of unspecified carpal bone, left wrist, subsequent encounter for fracture with routine healing: Secondary | ICD-10-CM | POA: Diagnosis not present

## 2020-02-11 DIAGNOSIS — I739 Peripheral vascular disease, unspecified: Secondary | ICD-10-CM | POA: Diagnosis not present

## 2020-02-11 DIAGNOSIS — G629 Polyneuropathy, unspecified: Secondary | ICD-10-CM | POA: Diagnosis not present

## 2020-02-11 DIAGNOSIS — N390 Urinary tract infection, site not specified: Secondary | ICD-10-CM

## 2020-02-11 DIAGNOSIS — R790 Abnormal level of blood mineral: Secondary | ICD-10-CM

## 2020-02-11 LAB — POCT URINALYSIS DIPSTICK
Appearance: NEGATIVE
Bilirubin, UA: NEGATIVE
Blood, UA: NEGATIVE
Glucose, UA: NEGATIVE
Ketones, UA: NEGATIVE
Leukocytes, UA: NEGATIVE
Nitrite, UA: NEGATIVE
Odor: NEGATIVE
Protein, UA: NEGATIVE
Spec Grav, UA: 1.01 (ref 1.010–1.025)
Urobilinogen, UA: 0.2 E.U./dL
pH, UA: 7 (ref 5.0–8.0)

## 2020-02-11 LAB — IRON, TOTAL/TOTAL IRON BINDING CAP
%SAT: 25 % (calc) (ref 16–45)
Iron: 70 ug/dL (ref 45–160)
TIBC: 275 mcg/dL (calc) (ref 250–450)

## 2020-02-11 LAB — BASIC METABOLIC PANEL
BUN/Creatinine Ratio: 22 (calc) (ref 6–22)
BUN: 25 mg/dL (ref 7–25)
CO2: 32 mmol/L (ref 20–32)
Calcium: 8.4 mg/dL — ABNORMAL LOW (ref 8.6–10.4)
Chloride: 99 mmol/L (ref 98–110)
Creat: 1.13 mg/dL — ABNORMAL HIGH (ref 0.60–0.93)
Glucose, Bld: 80 mg/dL (ref 65–99)
Potassium: 4.3 mmol/L (ref 3.5–5.3)
Sodium: 138 mmol/L (ref 135–146)

## 2020-02-11 LAB — MAGNESIUM: Magnesium: 1.8 mg/dL (ref 1.5–2.5)

## 2020-02-11 NOTE — Addendum Note (Signed)
Addended by: Mady Haagensen on: 02/11/2020 10:34 AM   Modules accepted: Orders

## 2020-02-15 ENCOUNTER — Other Ambulatory Visit: Payer: Self-pay

## 2020-02-15 ENCOUNTER — Encounter (HOSPITAL_BASED_OUTPATIENT_CLINIC_OR_DEPARTMENT_OTHER): Payer: Medicare Other | Attending: Internal Medicine | Admitting: Internal Medicine

## 2020-02-15 DIAGNOSIS — I7300 Raynaud's syndrome without gangrene: Secondary | ICD-10-CM | POA: Insufficient documentation

## 2020-02-15 DIAGNOSIS — M349 Systemic sclerosis, unspecified: Secondary | ICD-10-CM | POA: Diagnosis not present

## 2020-02-15 DIAGNOSIS — Z9981 Dependence on supplemental oxygen: Secondary | ICD-10-CM | POA: Diagnosis not present

## 2020-02-15 DIAGNOSIS — L97811 Non-pressure chronic ulcer of other part of right lower leg limited to breakdown of skin: Secondary | ICD-10-CM | POA: Insufficient documentation

## 2020-02-15 DIAGNOSIS — D649 Anemia, unspecified: Secondary | ICD-10-CM | POA: Insufficient documentation

## 2020-02-15 DIAGNOSIS — M793 Panniculitis, unspecified: Secondary | ICD-10-CM | POA: Diagnosis not present

## 2020-02-15 DIAGNOSIS — M069 Rheumatoid arthritis, unspecified: Secondary | ICD-10-CM | POA: Insufficient documentation

## 2020-02-15 DIAGNOSIS — L97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: Secondary | ICD-10-CM | POA: Insufficient documentation

## 2020-02-15 DIAGNOSIS — J9610 Chronic respiratory failure, unspecified whether with hypoxia or hypercapnia: Secondary | ICD-10-CM | POA: Diagnosis not present

## 2020-02-15 DIAGNOSIS — Z7902 Long term (current) use of antithrombotics/antiplatelets: Secondary | ICD-10-CM | POA: Insufficient documentation

## 2020-02-15 DIAGNOSIS — I83222 Varicose veins of left lower extremity with both ulcer of calf and inflammation: Secondary | ICD-10-CM | POA: Insufficient documentation

## 2020-02-15 DIAGNOSIS — I2721 Secondary pulmonary arterial hypertension: Secondary | ICD-10-CM | POA: Diagnosis not present

## 2020-02-15 DIAGNOSIS — I10 Essential (primary) hypertension: Secondary | ICD-10-CM | POA: Diagnosis not present

## 2020-02-15 DIAGNOSIS — L89616 Pressure-induced deep tissue damage of right heel: Secondary | ICD-10-CM | POA: Insufficient documentation

## 2020-02-15 DIAGNOSIS — M341 CR(E)ST syndrome: Secondary | ICD-10-CM | POA: Diagnosis not present

## 2020-02-15 DIAGNOSIS — L89610 Pressure ulcer of right heel, unstageable: Secondary | ICD-10-CM | POA: Diagnosis not present

## 2020-02-15 NOTE — Progress Notes (Signed)
[name] [name] ([id number]) Visit Report for 02/15/2020 Debridement Details Patient Name: Date of Service: [name] [name] 02/15/2020 8:00 AM Medical Record Number:[id number] Patient Account Number: [id number] Date of Birth/Sex: Treating RN: Jan 04, 1951 (69 y.o. [name] Primary Care Provider: [name] Other Clinician: Referring Provider: Treating Provider/Extender:Alizee Maple, Leotis Shames, MARY Weeks in Treatment: [phone] Debridement Performed for Wound #16 Right Calcaneus Assessment: Performed By: Physician Ricard Dillon., MD Debridement Type: Debridement Level of Consciousness (Pre- Awake and Alert procedure): Pre-procedure Yes - 08:58 Verification/Time Out Taken: Start Time: 08:58 Pain Control: Other : Benzocaine 20% Total Area Debrided (L x W): 1.5 (cm) x 2.4 (cm) = 3.6 (cm) Tissue and other material Non-Viable, Eschar debrided: Level: Non-Viable Tissue Debridement Description: Selective/Open Wound Instrument: Blade, Forceps Bleeding: Minimum Hemostasis Achieved: Pressure End Time: 09:00 Procedural Pain: 3 Post Procedural Pain: 0 Response to Treatment: Procedure was tolerated well Level of Consciousness Awake and Alert (Post-procedure): Post Debridement Measurements of Total Wound Length: (cm) 1.5 Stage: Unstageable/Unclassified Width: (cm) 2.4 Depth: (cm) 0.1 Volume: (cm) 0.283 Character of Wound/Ulcer Post Requires Further Debridement Debridement: Post Procedure Diagnosis Same as Pre-procedure Electronic Signature(s) Signed: 02/15/2020 6:14:31 PM By: Levan Hurst RN, BSN Signed: 02/15/2020 6:16:05 PM By: Linton Ham MD Entered By: Linton Ham on 02/15/2020 09:19:34 -------------------------------------------------------------------------------- HPI Details Patient Name: Date of Service: [name] [name] 02/15/2020 8:00 AM Medical Record Number:[id number] Patient Account Number: [id number] Date of Birth/Sex: Treating RN: 1951/07/27  (69 y.o. [name] Primary Care Provider: [name] Other Clinician: Referring Provider: Treating Provider/Extender:Kevyn Boquet, Leotis Shames, MARY Weeks in Treatment: [phone] History of Present Illness HPI Description: this is a patient who initially came to [place] for wounds on the medial malleoli bilaterally as well as her upper medial lower extremities bilaterally. These wounds eventually healed with assistance of Apligraf's bilaterally. While this was occurring she developed the current wound which opened into a fairly substantial wound on the right lower extremity. These are mostly secondary to venous stasis physiology however the patient also has underlying scleroderma, pulmonary hypertension. The wound has been making good progress lately with the Hydrofera Blue-based dressing. 03/17/2015; patient had a arterial evaluation a year ago. Her right ABI was 0.86 left was 1.0. Toe brachial index was 0.41 on the right 0.45 on the left. Her bilateral common femoral artery waveforms were triphasic. Her white popliteal posterior tibial artery and anterior tibial artery waveforms were monophasic with good amplitude. Luteal artery waveforms were biphasic it was felt that her bilateral great toe pressures are of normal although adequate for tissue healing. 03/24/2015. The condition of this wound is not really improved that. He was covered with as fibrinous surface slough and eschar. This underwent an aggressive debridement with both a curette and scalpel. I still cannot really get down to what I can would consider to be a viable surface. There is no evidence of infection. Previous workup for ischemia roughly a year ago was negative nevertheless I think that continues to be a concern 04/07/15. The patient arrived for application of second Apligraf. Once again the surface of this wound is certainly less viable than I would like for an advanced treatment option. An aggressive debridement was done. She  developed some arteriolar bleeding which required that along pressure and silver alginate. 04/21/15: change in the condition of this wound. Once again it is covered in a gelatinous surface slough. After debridement today surface of the wound looks somewhat better but now a heeling surface 05/05/15 Apligraf #4 applied.Still a lot of slough on  this wound. 05/19/15 Apligraf #5 applied. Still a lot of slough on this wound I did not aggressively remove this 06/02/15; continued copious amounts of surface slough. This debridement fairly easily. 06/08/2015 -- the last time she had a venous duplex study done was over 3 years ago and the surgery was prior to that. I have recommended that she sees Dr. early for a another opinion regarding a repeat venous duplex and possibly more endovenous ablation of vein stripping of micro-phlebotomies. 06/16/15; wound has a gelatinous surface eschar that the debridement fairly easily to a point. I don't disagree with the venous workup and perhaps even arterial re-evaluation. She is on prednisone 5 mg and continue his medications for her pulmonary artery hypertension I am not sure if the latter have any wound care healing issues I would need to investigate this. 06/23/15 continues with a gelatinous surface eschar with of fibrinous underlying. What I can see of this wound does not look unhealthy however I just can't get this material which I think is 2 different layers off. Empiric culture done 06/30/15; unfortunately not a lot of change in this wound. A gelatinous surface eschar is easily removed however it has a tight fibrinous surface underneath the. 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 The wound comes back and basically and unchanged state the. She has a gelatinous surface that is more easily removed however there is a tightly fibrinous surface underneath the. There is no evidence of infection. She has a vascular follow-up next month. I would have to  inject her in order to do a more aggressive debridement of this area 07/21/15 the wound is roughly in the same state albeit the debridement is done with greater ease. There is less of the fibrinous eschar underneath the. There is no evidence of infection. She has follow-up with vascular surgery next week. No evidence of surrounding infection. Her original distal wounds healed while this one formed. 07/28/15; wound is easier to debride. No wound erythema. She is seeing Dr. [name] next week. 08/18/15 Has been to [place] for repeat ablation. Have been using medihoney pad with some improvement 08/25/15; absolutely no change in the condition of this wound in either its overall size or surface condition in many months now. At one point I had this down to [place] healthier surface I think with [facility] however this did not actually progress towards closure. Do not believe that the wound has actually deteriorated in terms of volume at all. We have been using a medihoney pad which allows easier debridement of the gelatinous eschar but again no overall actual improvement. the patient is going towards an ablation with pain and vascular which I think is scheduled for next week. The only other investigation that I could first see would be a biopsy. She does have underlying scleroderma 09/02/15 eschar is much easier to debridement however the base of this does not look particularly vibrant. We changed to Iodoflex. The patient had her ablation earlier this week 09/09/15 again the debridement over the base of this wound is easier and the base of the wound looks considerably better. We will continue the Iodoflex. Dr. Tawni Millers has expressed his satisfaction with the result of her ablation 09/15/15 once again the wound is relatively free of surface eschar. No debridement was done today. It has a pale- looking base to it. although this is not as deep as it once was it seems to be expanding especially inferiorly.  She has had  recent venous ablation but this is no closer to healing.I've gone ahead and done a punch biopsy this from the inferior part of the wound close to normal skin 09/22/15: the wound is relatively free of surface eschar. There is some surrounding eschar. I'm not exactly sure at what level the surface is that I am seeing. 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 No evidence of atypical infection, malignancy. 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 No debridement was done 09/29/15; the wound surface is relatively free of surface eschar. There is eschar surrounding the walls of the wound. Aside from the improvement in the amount of surface slough. This wound has not progressed any towards closure. There is not even a surface that looks like there at this is ready. There is no evidence of any infection nor maligancy based on biopsy I did on 9/15. I continued with the Iodoflex however I am looking towards some alternative to try to promote some closure or filling in of this surface. Consider triple layer [place]. Collagen did not result in adequate control of the surface slough 10/13/15; the patient was in hospital last week with severe anemia. The wound looks somewhat better after debridement although there is widening medially. There is no evidence of infection. 10/20/15; patient's wound on the right lateral lower leg is essentially unchanged. This underwent a light surface debridement and in general the debridement is easier and the surface looks improved. I noted in doing this on the side of the wound what appears to be a piece of calcium deposition. The patient noted that she had previous things on the tips of her fingers. 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 She follows with rheumatology and I have asked them to talk to her about this. In view of that S  the nonhealing ulcer may have something to do with calcinosis and also unrelieved Raynaud's disease in this area. I should note that her original wounds on the right and left medial malleolus and the inner aspects of both legs just below the knees did however heal over 10/30/15; the patient's wound on the right lateral lower leg is essentially unchanged. I was able to remove some calcified material from the medial wound edge. 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 Otherwise the wound appears essentially unchanged there is less adherent surface eschar. Some of the calcified material was sent to pathology for analysis 11/17/15. The patient's wound on the right lateral leg is essentially unchanged. Wider Medially. 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 There is some surface slough but I did not debridement this. No evidence of surrounding infection. 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 This will make healing difficult 12/15/15; the wound has less depth surface has a fibrinous slough and calcifications in the wound edge no evidence of infection 12/22/15; the wound definitely has less Fibrinous slough on the base. Calcifications around the wound edges are still evident. Although the wound bed looks healthier it is still pale in appearance. Previous biopsy did not show malignancy 01/04/15; surgical debridement of nonviable slough and subcutaneous tissue the wound cleans up quite nicely but appears  to be expanding outward calcifications around the wound edges are still there. 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  She is to see her rheumatologist I'll see if he has any opinions on this. My punch biopsy done in September did not show calcifications although these are clearly evident. 01/19/16 light selective debridement of nonviable surface slough. There is epithelialization medially. This gives me reason for cautious optimism. She has been to see her rheumatologist, there is nothing that can be done for this type of soft tissue calcification associated with scleroderma 02/02/16 no debridement although there is a light surface slough. She has 2 peninsulas of skin 1 inferiorly and one medially. We continue to make a slight and slow but definite progressive here 02/16/16; light surface debridement with more attention to the circumference of the wound bed where the fibrinous eschar is more prevalent. No calcifications detected. She seems to have done nicely with the [facility] with some epithelialization and some improvement in the overall wound volume. 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 Continued attention to the circumference of the wound where the fibrinous eschar in calcinosis or prevalent. No calcifications were detected. She has continued improvement with Hydrofera Blue. The wound is no longer as deep 03/15/16 surgical debridement done to remove surface escha Especially around the circumference of the wound where there is nonviable subcutaneous tissue. In spite of this there is considerable improvement in the overall dimensions and depth of the wound. Islands of epithelialization are seen especially medially inferiorly and superiorly to a lesser extent. She is using Hydrofera Blue at home 03/29/16; surgical debridement done to remove surface eschar and nonviable subcutaneous tissue. This cleans up quite nicely mention slightly larger in terms of length and width however  depth is less 04/12/16; continued gradual improvement in terms of depth and the condition of the wound base. Debridement is done. Continuing long standing Hydrofera Blue at home with Kerlix Coban wraps 04/26/16; continued gradual improvement in terms of depth and management as well as condition of the wound base. Surgical debridement done she continues with Hydrofera Blue. 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 She initially came to this clinic venous insufficiency ulcers which have long since healed 05/17/16 continued improvement in terms of the depth and measurements of this wound although she has a tightly adherent fibrinous slough each time. We've been continued with long standing Hydrofera Blue which seems to done as well for this wound is many advanced treatment options. 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 She also has chronic venous insufficiency. She has an irritation on her lateral right ankle secondary to our wraps 05/10/16; wound appears to be smaller especially on the medial aspect and especially in the width. Wound was debridement surface looks better. She is also been in the hospital apparently with anemia again she tells me she had an endoscopy. Since she got home after 3 days which I believe was sometime last week she has had an irritated painful area on the right lateral ankle surrounding the lateral malleolus 05/31/16; much more adherent surface slough today then recently although I don't think the dimensions of changed that much. A more aggressive debridement is required. The irritated area over her medial malleolus is more pruritic and painful and I don't think represents cellulitis 06/14/16; no major  change in her wound dimensions however there is more tightly adherent surface slough which is disappointing. As well as she appears to have a new small area medially. Furthermore an irritated  uncomfortable area on the lateral aspect of her right foot just below the lateral malleolus. 06/21/16; I'm seeing the patient back in follow-up for the new areas under her major wound on the right anterior leg. She has been using Hydrofera Blue to this area probably for several months now and although the dressings seem to be helping for quite a period of time I think things have stagnated lately. She comes in today with a relatively tight adherent surface slough and really no changes in the wound shape or dimension. The 2 small areas she had inferiorly are tiny but still open they seem improved this well. There is no uncontrolled edema and I don't think there is any evidence of cellulitis. 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 Patient has had vascular evaluations both venous and arterial. I have biopsied this area. There is no obvious infection. The worrisome thing today is that she seems to be developing areas of erythema and epithelial damage on the medial aspect of the right foot. Also to a lesser degree inferior to the actual wound itself. Again I see no obvious changes to suggest cellulitis however as this is the only treatable option I will probably give her antibiotics. 07/13/16 no major change in the lady's large anterior right leg wound. Still covered with a very tightly adherent surface slough which is difficult to debridement. There is less erythema around this, culture last week grew pseudomonas I gave her ciprofloxacin. The area on her lateral right malleolus looks better- 08/02/16 the patient's wounds continued to decline. Her original large anterior right leg wound looks deeper. Still adherent surface slough that is difficult to debridement. She has a small area just below this and a punched-out wound just below her lateral malleolus. In the meantime she is been in hospital  with apparently an upper GI bleed on Plavix and aspirin. She is now just on Plavix she received 3 units of packed cells 08/23/16; since I last saw this 3 weeks ago, the open large area on her right leg looks about the same syrup. She has a small satellite lesion just underneath this. The area on her medial right ankle is now a deep necrotic wound. I attempted to debridement this however there is just too much pain. 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 She follows with vascular surgery and is scheduled for an angiogram in early September 09/06/16; the patient is going for an arteriogram tomorrow. Her original large wound on the right calf is about the same the satellite lesion underneath it is about the same however the area on her medial ankle is now deeper with exposed tendon. I am no longer attempting to debride these wounds 09/20/16; the patient has undergone a right femoral endarterectomy and Dacron patch angioplasty. This seems to have helped the flow in her right leg. 10/04/16; Arrived today for aggressive debridement of the wounds on her right calf the original wound the one beneath it and a difficult area over her right lateral leg just above the lateral malleolus. 10/25/16; her 3 open wounds are about the same in terms of dimensions however the surface appears  a lot healthier post debridement. Using Iodoflex 10/18/16 we have been using Iodoflex to her wounds which she tolerated with some difficulty. 10/11/16; has been using Santyl for a period of time with some improvement although again very adherent surface slough would prevent any attempted healing this. She has a original wound on the left calf, the satellite underneath that and the most recent wound on the right medial ankle. She has completed revascularization by Dr. early and has had venous ablation earlier. Want to go back to Iodoflex to see if week and get  a healthier surface to this wound bed failing this I think she'll need to be taken to the OR and I am prepared to call Dr. Marla Roe to discuss this. She is obviously not a good candidate for general anesthesia however.; 11/08/16; I put her on Iodoflex last time to see if I can get the wound bed any healthier and unfortunately today still had tremendous surface slough. 11/15/16; 4 weeks' worth of Iodoflex with not much improvement. Debridement on the major wounds on her left anterior leg is easier however this does not maintain from week to week. The punched-out area on her medial right ankle 11/29/16; I attempted to change the patient last visit to [facility] however she states this burned and was very uncomfortable therefore we gave her permission to go back to the eye out of complex which she already had at home. Also she noted a lot of pain and swelling on the lateral aspect of her leg before she traveled to [facility] for the holidays, I called her in doxycycline over the phone. This seems to have helped 12/06/16; Wounds unchanged by in large. Using Iodoflex 12/13/16; her wounds today actually looks somewhat better. The area on the right lateral lower leg has reasonably healthy-looking granulation and perhaps as actually filled and a bit. Debridement of the 2 wounds on the medial calf is easier and post debridement appears to have a healthier base. We have referred her to Dr. Migdalia Dk for consideration of operative debridement 12/20/16; we have a quick note from Dr. Merri Ray who feels that the patient needs to be referred to an academic center/plastic surgeon. This is due to the complexity of the patient's medical issues as it applies to anything in the OR. We have been using Iodoflex 12/27/16; in general the wounds on the right leg are better in terms of the difficult to remove surface slough. She has been using Iodoflex. She is approved for Apligraf which I  anticipated ordering in the next week or 2 when we get a better-looking surface 01/04/16 the deep wounds on the right leg generally look better. Both of them are debrided further surface slough. The area on the lateral right leg was not debrided. She is approved for Apligraf I think I'll probably order this either next week or the week after depending on the surface of the wounds superiorly. We have been using Iodoflex which will continue until then 01/11/16; the deep wounds on the right leg again have a surface slough that requires debridement. I've not been able to get the wound bed on either one of these wounds down to what would be acceptable for an advanced skin stab- like Apligraf. The area on the medial leg has been improving. We have been using hot Iodoflex to all wounds which seems to do the best at at least limiting the nonviable surface 01/24/17; we have continued Iodoflex and all her wound areas. Her debridement Gen. he is  easier and the surface underneath this looks viable. Nevertheless these are large area wounds with exposed muscle at least on the anterior parts. We have ordered Apligraf's for 2 weeks from now. The patient will be away next week 02/07/17; the patient was close to have first Apligraf today however we did not order one. I therefore replaced her Iodoflex. She essentially has 3 large punched-out areas on her right anterior leg and right medial ankle. 02/11/17; Apligraf #1 02/25/17; Apligraf #2. In general some improvement in the right medial ankle and right anterior leg wounds. The larger wound medially perhaps some better 03/11/17; Apligraf #3. In general continued improvement in the right medial ankle and the right anterior leg wounds. 03/25/17 Apligraf #4. In general continued improvement especially on the right medial ankle and the lower 04/08/17; Apligraf #5 in general continued improvement in all wound sites. 04/22/17; post Apligraf #5 her wound beds continued to look a lot  better all of them up to the surface of the surrounding skin. Had a caramel-colored slough that I did not debrided in case there is residual Apligraf effect. The wounds are as good as I have seen these looking quite some time. 04/29/17; we applied [place] and last week after completing her Apligraf. Wounds look as though they've contracted somewhat although they have a nonviable surface which was problematic in the past. Apparently she has been approved for further Apligraf's. We applied Iodosorb today after debridement. 05/06/17; we're fortunate enough today to be able to apply additional Apligraf approved by her insurance. In general all of her wounds look better Apligraf #6 05/24/17; Apligraf #7 continued improvement in all wounds 3 06/10/17 Apligraf #8. Continued improvement in the surface of all wounds. Not much of an improvement in dimensions as I might a follow 06/24/17 Apligraf #9 continued general improvement although not as much change in the wound areas I might of like. She has a new open area on the right anterior lateral ankle very small and superficial. She also has a necrotic wound on the tip of her right index finger probably secondary to severe uncontrolled Raynaud's phenomenon. She is already on sildenafil and already seen her rheumatologist who gave her Keflex. 07/08/17; Apligraf #10. Generalized improvement although she has a small additional wound just medial to the major wound area. 07/22/17; after discussion we decided not to reorder any further Apligraf's although there is been considerable improvement with these it hasn't been recently. The major wound anteriorly looks better. Smaller wounds beneath this and the more recent one and laterally look about the same. The area on the right lateral lower leg looks about the same 07/29/17 this is a patient who is exceedingly complex. She has advanced scleroderma, crest syndrome including calcinosis, PAD status post  revascularization, chronic venous insufficiency status post ablations. She initially presented to this clinic with wounds on her bilateral lower legs however these closed. More recently we have been dealing with a large open area superiorly on the right anterior leg, a smaller wound underneath this and then another one on the right medial lower extremity. These improve significantly with 10 Apligraf applications. Over the last 2-3 weeks we are making good progress with Hydrofera Blue and these seem to be making progress towards closure 08/19/17; wounds continued to make good improvement with Hydrofera Blue and episodic aggressive debridements 08/26/17; still using Hydrofera Blue. Good improvements 09/09/17; using Hydrofera Blue continued improvement. Area on the lateral part of her right leg has only a small remaining open area. The  small inferior satellite region is for all intents and purposes closed. Her major wound also is come in in terms of depth and has advancing epithelialization. 09/16/17; using Hydrofera Blue with continued improvement. The smaller satellite wound. We've closed out today along with a new were satellite wound medially. The area on her medial ankle is still open and her major wound is still open but making improvement. All using Hydrofera Blue. Currently 09/30/17; using Hydrofera Blue. Still a small open area on the lateral right ankle area and her original major wound seems to be making gradual and steady improvement. 10/14/17; still using Hydrofera Blue. Still too small open areas on the right lateral ankle. Her original major wound is horizontal and linear. The most problematic area paradoxically seems to be the area to the medial area wears I thought it would be the lateral. 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 10/28/17; still using Hydrofera Blue. Right lateral ankle has a very small open area superiorly on the most  lateral part of the wound. Her original open wound has 2 open areas now separated by normal skin and we've redefined this. 11/11/17; still using Hydrofera Blue area and right lateral ankle continues to have a small open area on mostly the lateral part of the wound. Her original wound has 2 small open areas now separated by a considerable amount of normal skin 11/28/17; the patient called in slightly before Thanksgiving to report pain and erythema above the wound on the right leg. In the past this is responded well to treatment for cellulitis and I gave her over the phone doxycycline. She stated this resulted in fairly abrupt improvement. We have been using Hydrofera Blue for a prolonged period of time to the larger wound anteriorly into the remaining wound on her right right lateral ankle. The latter is just about closed with only a small linear area and the bottom of the [place]. 12/02/17; use endoform and left the dressing on since last visit. There is no tenderness and no evidence of infection. 12/16/17; patient has been using Endoform but not making much progress. The 2 punched out open areas anteriorly which were the reminiscence of her major wound appear deeper. The area on the lateral aspect of her right calf also appears deeper. Also she has a puzzling tender swelling above her wound on the right leg. This seems larger than last time. Just above her wounds there appears to be some fluctuance in this area it is not erythematous and there is no crepitus 12/30/17; patient has been using Endoform up until last week we used Hydrofera Blue. Ultrasound of the swelling above her 2 major wounds last time was negative for a fluid collection. I gave her cefaclor for the erythema and tenderness in this area which seems better. Unfortunately both punched out areas anteriorly and the area on her right medial lower leg appear deeper. In fact the lateral of the wounds anteriorly actually looks as though it  has exposed tendon and/or muscle sheath. She is not systemically unwell. She is complaining of vaginitis type symptoms presumably Candida from her antibiotics. 01/06/18; we're using santyl. she has 2 punched out areas anteriorly which were initially part of a large wound. Unfortunately medially this is now open to tendon/muscle. All the wounds have the same adherent very difficult to debride surface. 01/20/18; 2 week follow-up using Santyl. She has the 2 punched out areas anteriorly which were initially part of her large surface  wound there. Medially this still has exposed muscle. All of these have the same tightly adherent necrotic surface which requires debridement. PuraPly was not accepted by the patient's insurance however her insurance I think it changed therefore we are going to run Apligraf to gain 02/03/18; the patient has been using Santyl. The wound on the right lateral ankle looks improved and the 2 areas anteriorly on the right leg looks about the same. The medial one has exposed muscle. The lateral 1 requiresdebridement. We use PuraPly today for the first week 02/10/18; PuraPly #2. The patient has 3 wounds. The area on the right lateral ankle, 2 areas anteriorly that were part of her original large wound in this area the medial one has exposed muscle. All of the wounds were lightly debrided with a number 3 curet. PuraPly #2 applied the lateral wound on the calf and the right lateral ankle look better. 02/17/18; PuraPly #3. Patient has 3 wounds. The area on the right lateral ankle in 2 areas internally that were part of her original large wound. The lateral area has exposed muscle. She arrived with some complaints of pain around the right ankle. 02/24/18; PuraPly #4; not much change in any of the 3 wound areas. Right lateral ankle, right lateral calf. Both of these required debridement with a #3 curet. She tolerates this marginally. The area on the medial leg still has exposed muscle. Not  much change in dimensions 03/03/18 PuraPly #5. The area on the medial ankle actually looks better however the 2 separate areas that were original parts of the larger right anterior leg wound look as though they're attempting to coalesce. 03/10/18; PuraPly #6. The area on the medial ankle actually continues to look and measures smaller however the 2 separate areas that were part of the original large wound on the right anterior leg have now coalesced. There hasn't been much improvement here. The lateral area actually has underlying exposed muscle 03/17/18-she is here in follow-up evaluation for ulcerations to the right lower extremity. She is voicing no complaints or concerns. She tolerated debridement. Puraply#7 placed 03/24/18; difficult right lower extremity ulcerations. PuraPly #8 place. She is been approved for [employer]. She did very well with Apligraf today however she is apparently reached her "lifetime max" 03/31/18; marginal improvement with PuraPly although her wounds looked as good as they have in several weeks today. Used TheraSkins #1 04/14/18 TheraSkin #2 today 04/28/18 TheraSkins #3. Wound slightly improved 05/12/18; TheraSkin skin #4. Wound response has been variable. 05/27/18 TheraSkin #5. Generally improvement in all wound areas. I've also put her in 3 layer compression to help with the severe venous hypertension 06/09/18; patient is done quite well with the TheraSkins unfortunately we have no further applications. I also put her in 3 layer compression last week and that really seems to of helped. 06/16/18; we have been using silver collagen. Wounds are smaller. Still be open area to the muscle layer of her calf however even that is contracted somewhat. She tells me that at night sometimes she has pain on the right lateral calf at the site of her lower wound. Notable that I put her into 3 layer compression about 3 weeks ago. She states that she dangles her leg over the bed that makes it  feel better but she does not describe claudication during the day She is going to call her secondary insurance to see if they will continue to cover advanced treatment products I have reviewed her arterial studies from 01/22/17; this showed an  ABI in the right of 1 and on the left noncompressible. TBI on the right at 0.30 on the left at 0.34. It is therefore possible she has significant PAD with medial calcification falsely elevating her ABI into the normal range. I'll need to be careful about asking her about this next week it's possible the 3 layer compression is too much 06/23/18; was able to reapply TheraSkin 1 today. Edema control is good and she is not complaining of pain no claudication 07/07/18;no major change. New wound which was apparently a taper removal injury today in our clinic between her 2 wounds on the right calf TheraSkins #2 07/14/18; I think there is some improvement in the right lateral ankle and the medial part of her wound. There is still exposed muscle medially. 07/28/18; two-week follow-up. TheraSkins#3. Unfortunately no major change. She is not a candidate I don't think for skin grafting due to severe venous hypertension associated with her scleroderma and pulmonary hypertension 08/11/18 Patient is here today for her [employer] application #99 (#5 of the second set). She seems to be doing well and in the base of the wound appears to show some progress at this point. This is the last approved Theraskin of the second set. 08/25/18; she has completed TheraSkin. There has been some improvement on the right lateral calf wound as well as the anterior leg wounds. The open area to muscle medially on the anterior leg wound is smaller. I'm going to transition her back to [facility] under Kerlix Coban change every second day. She reports that she had some calcification removed from her right upper arm. We have had previous problems with calcifications in her wounds on her legs but  that has not happened recently 09/08/18;using Hydrofera Blue on both her wound areas. Wounds seem to of contracted nicely. She uses Kerlix Coban wrap and changes at home herself 09/22/2018; using Hydrofera Blue on both her wound areas. Dimensions seem to have come down somewhat. There is certainly less depth in the medial part of the mid tibia wound and I do not think there is any exposed muscle at this point. 10/06/2018; 2-week follow-up. Using [facility] on her wound areas. Dimensions have come down nicely both on the right lateral ankle area in the right mid tibial area. She has no new complaints 10/20/2018; 2-week follow-up. She is using Hydrofera Blue. Not much change from the last time she was here. The area on the lateral ankle has less depth although it has raised edges on one side. I attempted to remove as much of the raised edge as I could without creating more additional wounds. The area on the right anterior mid tibia area looks the same. 11/03/2018; 2-week follow-up she is using Hydrofera Blue. On the right anterior leg she now has 2 wounds separated by a large area of normal skin. The area on the medial part still has I think exposed muscle although this area itself is a lot smaller. The area laterally has some depth. Both areas with necrotic debris. The area on her right lateral ankle has come in nicely 11/17/2018; patient continues to use Hydrofera Blue. We have been increasing separation of the 2 wounds anteriorly which were at one point joint the area on the right lateral calf continues to have I think some improvement in depth. 12/08/2018; patient continues to use Hydrofera Blue. There is some improvement in the area on the right lateral calf. The 2 areas that were initially part of the original large wound in  the mid right tibia are probably about the same. In fact the medial area is probably somewhat larger. We will run puraply through the patient's insurance 12/22/2018; she  has been using Hydrofera Blue. We have small wounds on the right lateral calf and 2 small areas that were initially part of a large wound in the right mid tibia. We applied pure apply #1 today. 12/29/2018; we applied puraply #2. Her wounds look somewhat better especially on the right lateral calf and the lateral part of her original wound in the mid tibial area. 01/05/2019; perhaps slightly improved in terms of wound bed condition but certainly not as much improvement as I might of liked. Puraply #3 1/13: we did not have a correct sized puraply to apply. wounds more pinched out looking, I increased her compression to 3 layer last week to help with significant multilevel venous hypertension. Since then I've reviewed her arterial status. She has a right femoral endarterectomy and a distal left SFA stent. She was being followed by Dr. [name] for a period however she does not appear to have seen him in 3 years. I will set up an appointment. 1/20. The patient has an additional wound on the right lateral calf between the distal wound and proximal wounds. We did not have Puraply last week. Still does not have a follow-up with Dr. early 1/27: Follow-up with Dr. early has been arranged apparently with follow-up noninvasive studies. Wounds are measuring roughly the same although they certainly look smaller 2/3; the patient had non invasive studies. Her ABIs on the right were 0.83 and on the left 1.02 however there was no great toe pressure bilaterally. Also worrisome monophasic waveforms at the PTA and dorsalis pedis. We are still using Puraply. We have had some improvement in all of the wounds especially the lateral part of the mid tibial area. 2/10; sees Dr. early of vein and vascular re-arterial studies next week. Puraply reapplied today. 2/17; Dr. early of vein and vascular his appointment is tomorrow. Puraply reapplied after debridement of all wounds 2/24; the patient saw Dr. early I reviewed his note.  sHe noted the previous right femoral endarterectomy with a Dacron patch. He also noted the normal ABI and the monophasic waveforms suggesting tibial disease. Overall he did not feel that she had any evidence of arterial insufficiency that would impair her wound healing. He did note her venous disease as well. He suggested PRN follow-up. 3/2; I had the last puraply applied today. The original wounds over the mid tibia area are improved where is the area on the right lower leg is not 3/9; wounds are smaller especially in the right mid tibia perhaps slightly in the right lateral calf. We finished with puraply and went to endoform today 3/23; the patient arrives after 2 weeks. She has been using endoform. I think all of her wounds look slightly better which includes the area on the right lateral calf just above the right lateral malleolus and the 2 in the right mid tibia which were initially part of the same wound. 4/27; TELEHEALTH visit; the patient was seen for telehealth visit today with her consent in the middle of the worldwide epidemic. Since she was last here she called in for antibiotics with pain and tenderness around the area on the right medial ankle. I gave her empiric doxycycline. She states this feels better. She is using endoform on both of these areas 5/11 TELEHEALTH; the patient was seen for telehealth visit today. She was accompanied at home  by her husband. She has severe pulmonary hypertension accompanied scleroderma and in the face of the Covid epidemic cannot be safely brought into our clinic. We have been using endoform on her wound areas. There are essentially 3 wound areas now in the left mid tibia now 2 open areas that it one point were connected and one on the right lateral ankle just above the malleolus. 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 She cannot come to clinic. The wounds were reviewed today via telehealth. She has severe chronic venous hypertension which I think is centrally mediated. She has wounds on her right anterior tibia and right lateral ankle area. These are chronic. She has been using endoform. 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 We have been following her from telehealth. She has severe chronic venous hypertension which may be mostly centrally mediated secondary to right heart heart failure. She has wounds on her right anterior tibia and right lateral ankle these are chronic we have been using endoform 6/22; TELEHEALTH; this patient was seen today via telehealth. 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 Since the last time we had contact with this patient she developed some pain and erythema around the wound on her right lateral malleolus/ankle and we put in antibiotics for her. This is resulted in good improvement with resolution of the erythema and tenderness. I changed her to silver alginate last time. We had been using endoform for an extended period of time 7/13; TELEHEALTH; this patient was seen today via telehealth. 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 She would continue to be at a prohibitive risk to be brought into our clinic unless this was absolutely necessary. These visits have been done with her approval as well as her husband. We have been using silver alginate to the areas on the right mid tibia and right lateral lower leg. 7/27 TELEHEALTH; patient was seen along with her husband today via telehealth. 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 We changed her to sample last visit. She has 2 areas a chronic wound on her right mid tibia and one just above her ankle. These were not the original wounds when she came into this clinic but she developed them during treatment 8/17; she comes in for her first face-to-face visit in a long period. She has a remaining area just medial to the right tibia which is the last open part of her large wound across this area. She also has an area on the right lateral lower leg. We prescribed Santyl last telehealth visit but they were concerned that this was making a deeper so they put silver alginate on it last week. Her husband changes the dressings. 8/31; using Santyl to the 2 wound areas some improvement in wound surfaces. Husband has surgery in 2 weeks we will put her out 3 weeks. Any of the advanced treatment options that I can think of that would be eligible for this wound would also cause her to have to come in weekly. The risk  that the patient is just too high 9/21. Using Santyl to the 2 wound areas. Both of these are somewhat better although the medial mid tibia area still has exposed muscle. Lateral ankle requiring debridement. Using Santyl 10/12; using Santyl to the 2 wound areas. One on the right lateral ankle and the other in the medial calf which still has exposed muscle. Both areas have come down in size and have better looking surfaces. She has made nice progress with santyl 11/2; we have been using Santyl to the 2 wound areas. Right lateral ankle and the other in the mid tibia area the medial part of this still has exposed muscle. 11/23/2019 on evaluation today patient appears to be doing about the same really with regard to her wounds. She is actually not very pleased with how things seem to be progressing at this point she tells me that she really has not noted much improvement unfortunately. With that being said there is no signs  of active infection at this time. There is some slough buildup noted at this time which again along with some dry skin around the edges of the wound I think would benefit her to try to debride some of this away. Fortunately her pain is doing fairly well. She still has exposed muscle in the right medial/tibial area. 12/14; TELEHEALTH; she was changed to [facility] to the right calf wound and right lateral ankle wound when she was here last time. Unfortunately since then she had a fall with a pelvic fracture and a fracture of her wrist. She was apparently hospitalized for 5 days. I have not looked at her discharge summary. She apparently came out of the hospital with a blister on her right heel. She was seen today via telehealth by myself and our case manager. The patient and her husband were present. She has been using Hydrofera Blue at our direction from the last time she was in the clinic. There is been no major improvement in fact the areas appear deeper and with a less viable surface 01/04/2020; TELEHEALTH; the patient was seen today in accompaniment of her husband and our nurse. She has 3 open areas 1 on the right medial mid tibia, one on the right lateral ankle and a large eschared pressure area on the right heel. We have been using Iodoflex to the 2 original wounds. 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 It is simply too perilous for her to be seen in any other way 1/26; TELEHEALTH; the patient was seen today in accompaniment of her husband and our RN one of our nurses. She still has the 3 open areas 1 on the right medial mid tibia which is the remanent of a more extensive wound in this area, one on the right lateral ankle and a large eschared pressure area on the right heel. We have been using Iodoflex to the 2 original wounds and a bed at 9 application to the eschared area on the heel 2/15; the first time we have seen this patient and then several  months out of concern for the pandemic. She had a large horizontal wound in the mid tibia. Only the medial aspect of this is still open. Area on the lateral ankle is just about closed. She had a new pressure ulcer on the right heel which I have removed some of the eschar. We have been using Iodoflex which I will continue. The area in the mid tibia has a round circle in the middle  of exposed muscle. I think we would have to use an advanced treatment product to stimulate granulation over this area. We will run this through her insurance. She is not eligible for plastic surgery for many reasons Electronic Signature(s) Signed: 02/15/2020 6:16:05 PM By: Linton Ham MD Entered By: Linton Ham on 02/15/2020 09:20:58 -------------------------------------------------------------------------------- Physical Exam Details Patient Name: Date of Service: [name], [name] F. 02/15/2020 8:00 AM Medical Record Number:[id number] Patient Account Number: [id number] Date of Birth/Sex: Treating RN: 08-Oct-1951 (69 y.o. [name] Primary Care Provider: [name] Other Clinician: Referring Provider: Treating Provider/Extender:Truly Stankiewicz, Leotis Shames, MARY Weeks in Treatment: 64 Constitutional Patient is hypertensive.. Pulse regular and within target range for patient.Marland Kitchen Respirations regular, non-labored and within target range.. Temperature is normal and within the target range for the patient.Marland Kitchen Appears in no distress. Notes Wound exam; the area in the mid tibia still has a small area of exposed muscle the surrounding granulation however looks better. Right lateral ankle only has a minuscule area even looks open here. I removed some callus and skin everything else looks closed Black eschar over the right heel I removed most of this with pickups and a #15 blade we will use Iodoflex on this area as well Electronic Signature(s) Signed: 02/15/2020 6:16:05 PM By: Linton Ham MD Entered By: Linton Ham on 02/15/2020 09:21:51 -------------------------------------------------------------------------------- Physician Orders Details Patient Name: Date of Service: [name] [name] 02/15/2020 8:00 AM Medical Record Number:[id number] Patient Account Number: [id number] Date of Birth/Sex: Treating RN: 07-28-1951 (69 y.o. 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 Wound #5 Right,Medial Lower Leg Change Dressing every other day. Wound #7 Right,Lateral Malleolus Change Dressing every other day. Skin Barriers/Peri-Wound Care Moisturizing lotion - both legs Wound Cleansing May shower and wash wound with soap and water. - on days that dressing is changed Primary Wound Dressing Wound #16 Right Calcaneus Iodoflex Wound #5 Right,Medial Lower Leg Iodoflex Wound #7 Right,Lateral Malleolus Iodoflex Secondary Dressing Wound #16 Right Calcaneus Dry Gauze Heel Cup Wound #5 Right,Medial Lower Leg Dry Gauze Wound #7 Right,Lateral Malleolus Dry Gauze Edema Control Kerlix and Coban - Right Lower Extremity Avoid standing for long periods of time Elevate legs to the level of the heart or above for 30 minutes daily and/or when  sitting, a frequency of: - throughout the day Off-Loading Turn and reposition every 2 hours Other: - float heels off of bed/chair with pillow under calves [facility] for wound care. Alvis Lemmings Electronic Signature(s) Signed: 02/15/2020 6:14:31 PM By: Levan Hurst RN, BSN Signed: 02/15/2020 6:16:05 PM By: Linton Ham MD Entered By: Levan Hurst on 02/15/2020 09:03:51 -------------------------------------------------------------------------------- Problem List Details Patient Name: Date of Service: AMAYRANY, CAFARO 02/15/2020 8:00 AM Medical Record Number:[id number] Patient Account Number: [id number] Date of Birth/Sex: Treating RN: 1951-09-22 (69 y.o. 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 02/24/2015 No Yes of calf and inflammation I87.331 Chronic venous hypertension (idiopathic) with ulcer 10/04/2016 No Yes and inflammation of right lower extremity L89.616 Pressure-induced deep tissue damage of right heel 12/14/2019 No Yes Inactive Problems ICD-10 Code Description Active Date Inactive Date 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/19/2016 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 [name] Primary Care Provider: [name] Other Clinician: Referring Provider: Treating Provider/Extender:Shatina Streets, Leotis Shames, MARY Weeks in Treatment: 412 Subjective History of Present Illness (HPI) this is a patient who initially came to [place] for wounds on the medial malleoli bilaterally as well as her upper medial lower extremities bilaterally. These wounds eventually healed with assistance of Apligraf's bilaterally. While this was occurring she developed the current wound which opened into a fairly substantial wound on the right lower extremity. These are mostly secondary to venous stasis physiology however the patient also has underlying scleroderma, pulmonary hypertension. The wound has been making good progress lately with the Hydrofera Blue-based dressing. 03/17/2015; patient had a arterial evaluation a year ago. Her right ABI was 0.86 left was 1.0. Toe brachial index was 0.41 on the right 0.45 on the left. Her bilateral common femoral artery waveforms were triphasic. Her white popliteal posterior tibial artery and anterior tibial artery waveforms were monophasic with good amplitude. Luteal artery waveforms were biphasic it was felt that her bilateral great toe pressures are of normal although adequate for tissue healing. 03/24/2015. The condition of this wound is not really improved that. He was covered with as fibrinous surface slough and eschar. This underwent an aggressive debridement with both a curette and scalpel. I still cannot really get down to what I can would consider to be a viable surface. There is no evidence of infection. Previous workup for ischemia roughly a  year ago was negative nevertheless I think that continues to be a concern 04/07/15. The patient arrived for application of second Apligraf. Once again the surface of this wound is certainly less viable than I would like for an advanced treatment option. An aggressive debridement was done. She developed some arteriolar bleeding which required that along pressure and silver alginate. 04/21/15: change in the condition of this wound. Once again it is covered in a gelatinous surface slough. After debridement today surface of the wound looks somewhat better but now a heeling surface 05/05/15 Apligraf #4 applied.Still a lot of slough on this wound. 05/19/15 Apligraf #5 applied. Still a lot of slough on this wound I did not aggressively remove this 06/02/15; continued copious amounts of surface slough. This debridement fairly easily. 06/08/2015 -- the last time she had a venous duplex study done was over 3 years ago and the surgery was prior to that. I have recommended that she sees Dr. early for a another  opinion regarding a repeat venous duplex and possibly more endovenous ablation of vein stripping of micro-phlebotomies. 06/16/15; wound has a gelatinous surface eschar that the debridement fairly easily to a point. I don't disagree with the venous workup and perhaps even arterial re-evaluation. She is on prednisone 5 mg and continue his medications for her pulmonary artery hypertension I am not sure if the latter have any wound care healing issues I would need to investigate this. 06/23/15 continues with a gelatinous surface eschar with of fibrinous underlying. What I can see of this wound does not look unhealthy however I just can't get this material which I think is 2 different layers off. Empiric culture done 06/30/15; unfortunately not a lot of change in this wound. A gelatinous surface eschar is easily removed however it has a tight fibrinous surface underneath the. 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 The wound comes back and basically and unchanged state the. She has a gelatinous surface that is more easily removed however there is a tightly fibrinous surface underneath the. There is no evidence of infection. She has a vascular follow-up next month. I would have to inject her in order to do a more aggressive debridement of this area 07/21/15 the wound is roughly in the same state albeit the debridement is done with greater ease. There is less of the fibrinous eschar underneath the. There is no evidence of infection. She has follow-up with vascular surgery next week. No evidence of surrounding infection. Her original distal wounds healed while this one formed. 07/28/15; wound is easier to debride. No wound erythema. She is seeing Dr. [name] next week. 08/18/15 Has been to [facility] for repeat ablation. Have been using medihoney pad with some improvement 08/25/15; absolutely no change in the condition of this wound in either its overall size or surface condition in many months now. At one point I had this down to [place] healthier surface I think with [facility] however this did not actually progress towards closure. Do not believe that the wound has actually deteriorated in terms of volume at all. We have been using a medihoney pad which allows easier debridement of the gelatinous eschar but again no overall actual improvement. the patient is going towards an ablation with pain and vascular which I think is scheduled for next week. The only other investigation that I could first see would be a biopsy. She does have underlying scleroderma 09/02/15 eschar is much easier to debridement however the base of this does not look particularly vibrant. We changed to Iodoflex. The patient had her ablation earlier this week 09/09/15 again the debridement over the base of this wound is easier and the base of the wound looks considerably better. We will continue the Iodoflex. Dr. Tawni Millers has  expressed his satisfaction with the result of her ablation 09/15/15 once again the wound is relatively free of surface eschar. No debridement was done today. It has a pale- looking base to it. although this is not as deep as it once was it seems to be expanding especially inferiorly. She has had recent venous ablation but this is no closer to healing.I've gone ahead and done a punch biopsy this from the inferior part of the wound close to normal skin 09/22/15: the wound is relatively free of surface eschar. There is some surrounding eschar. I'm not exactly sure at what level the surface is that I am seeing. 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  No evidence of atypical infection, malignancy. 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 No debridement was done 09/29/15; the wound surface is relatively free of surface eschar. There is eschar surrounding the walls of the wound. Aside from the improvement in the amount of surface slough. This wound has not progressed any towards closure. There is not even a surface that looks like there at this is ready. There is no evidence of any infection nor maligancy based on biopsy I did on 9/15. I continued with the Iodoflex however I am looking towards some alternative to try to promote some closure or filling in of this surface. Consider triple layer Oasis. Collagen did not result in adequate control of the surface slough 10/13/15; the patient was in hospital last week with severe anemia. The wound looks somewhat better after debridement although there is widening medially. There is no evidence of infection. 10/20/15; patient's wound on the right lateral lower leg is essentially unchanged. This underwent a light surface debridement and in general the debridement is easier and the surface looks improved. I noted in doing this on the side of the wound what appears to be a piece of calcium deposition. The  patient noted that she had previous things on the tips of her fingers. 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 She follows with rheumatology and I have asked them to talk to her about this. In view of that S the nonhealing ulcer may have something to do with calcinosis and also unrelieved Raynaud's disease in this area. I should note that her original wounds on the right and left medial malleolus and the inner aspects of both legs just below the knees did however heal over 10/30/15; the patient's wound on the right lateral lower leg is essentially unchanged. I was able to remove some calcified material from the medial wound edge. 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 Otherwise the wound appears essentially unchanged there is less adherent surface eschar. Some of the calcified material was sent to pathology for analysis 11/17/15. The patient's wound on the right lateral leg is essentially unchanged. Wider Medially. 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 There is some surface slough but I did not debridement this. No evidence of surrounding infection. 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 This will make healing difficult 12/15/15; the wound has less depth surface has a fibrinous slough and calcifications in the wound edge no evidence of infection 12/22/15; the wound definitely has less Fibrinous slough on the base. Calcifications around the wound edges are still evident. Although the wound bed looks healthier it is still pale in appearance. Previous biopsy did not  show malignancy 01/04/15; surgical debridement of nonviable slough and subcutaneous tissue the wound cleans up quite nicely but appears to be expanding outward calcifications around the wound edges are still there. 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 She is to see her rheumatologist I'll see if he has any opinions on this. My punch biopsy done in September did not show calcifications although these are clearly evident. 01/19/16 light selective debridement of nonviable surface slough. There is epithelialization  medially. This gives me reason for cautious optimism. She has been to see her rheumatologist, there is nothing that can be done for this type of soft tissue calcification associated with scleroderma 02/02/16 no debridement although there is a light surface slough. She has 2 peninsulas of skin 1 inferiorly and one medially. We continue to make a slight and slow but definite progressive here 02/16/16; light surface debridement with more attention to the circumference of the wound bed where the fibrinous eschar is more prevalent. No calcifications detected. She seems to have done nicely with the [facility] with some epithelialization and some improvement in the overall wound volume. 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 Continued attention to the circumference of the wound where the fibrinous eschar in calcinosis or prevalent. No calcifications were detected. She has continued improvement with Hydrofera Blue. The wound is no longer as deep 03/15/16 surgical debridement done to remove surface escha Especially around the circumference of the wound where there is nonviable subcutaneous tissue. In spite of this there is considerable improvement in the overall dimensions and depth of the wound. Islands of epithelialization  are seen especially medially inferiorly and superiorly to a lesser extent. She is using Hydrofera Blue at home 03/29/16; surgical debridement done to remove surface eschar and nonviable subcutaneous tissue. This cleans up quite nicely mention slightly larger in terms of length and width however depth is less 04/12/16; continued gradual improvement in terms of depth and the condition of the wound base. Debridement is done. Continuing long standing Hydrofera Blue at home with Kerlix Coban wraps 04/26/16; continued gradual improvement in terms of depth and management as well as condition of the wound base. Surgical debridement done she continues with Hydrofera Blue. 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 She initially came to this clinic venous insufficiency ulcers which have long since healed 05/17/16 continued improvement in terms of the depth and measurements of this wound although she has a tightly adherent fibrinous slough each time. We've been continued with long standing Hydrofera Blue which seems to done as well for this wound is many advanced treatment options. 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 She also has chronic venous insufficiency. She has an irritation on her lateral right ankle secondary to our wraps 05/10/16; wound appears to be smaller especially on the medial aspect and especially in the width. Wound was debridement surface looks better. She is also been in the hospital apparently with anemia again she tells me she had an endoscopy. Since she got home after 3 days which I believe was sometime last week she has had an irritated painful area on the right lateral ankle surrounding the lateral malleolus 05/31/16; much more adherent surface slough today then recently although I don't think the dimensions of changed that much. A more aggressive debridement is required. The irritated area over her medial  malleolus is more pruritic and painful and I don't think represents cellulitis 06/14/16; no major change in her wound dimensions however there is more tightly adherent surface slough which is disappointing. As well as she appears to have a new small area medially. Furthermore an irritated uncomfortable area on the lateral aspect of her right foot just below the lateral malleolus. 06/21/16; I'm seeing the patient back in follow-up for the new areas under her major wound on the right  anterior leg. She has been using Hydrofera Blue to this area probably for several months now and although the dressings seem to be helping for quite a period of time I think things have stagnated lately. She comes in today with a relatively tight adherent surface slough and really no changes in the wound shape or dimension. The 2 small areas she had inferiorly are tiny but still open they seem improved this well. There is no uncontrolled edema and I don't think there is any evidence of cellulitis. 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 Patient has had vascular evaluations both venous and arterial. I have biopsied this area. There is no obvious infection. The worrisome thing today is that she seems to be developing areas of erythema and epithelial damage on the medial aspect of the right foot. Also to a lesser degree inferior to the actual wound itself. Again I see no obvious changes to suggest cellulitis however as this is the only treatable option I will probably give her antibiotics. 07/13/16 no major change in the lady's large anterior right leg wound. Still covered with a very tightly adherent surface slough which is difficult to debridement. There is less erythema around this, culture last week grew pseudomonas I gave her ciprofloxacin. The area on her lateral right malleolus looks better- 08/02/16 the  patient's wounds continued to decline. Her original large anterior right leg wound looks deeper. Still adherent surface slough that is difficult to debridement. She has a small area just below this and a punched-out wound just below her lateral malleolus. In the meantime she is been in hospital with apparently an upper GI bleed on Plavix and aspirin. She is now just on Plavix she received 3 units of packed cells 08/23/16; since I last saw this 3 weeks ago, the open large area on her right leg looks about the same syrup. She has a small satellite lesion just underneath this. The area on her medial right ankle is now a deep necrotic wound. I attempted to debridement this however there is just too much pain. 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 She follows with vascular surgery and is scheduled for an angiogram in early September 09/06/16; the patient is going for an arteriogram tomorrow. Her original large wound on the right calf is about the same the satellite lesion underneath it is about the same however the area on her medial ankle is now deeper with exposed tendon. I am no longer attempting to debride these wounds 09/20/16; the patient has undergone a right femoral endarterectomy and Dacron patch angioplasty. This seems to have helped the flow in her right leg. 10/04/16; Arrived today for aggressive debridement of the wounds on her right calf the original wound the one beneath it and a difficult area over her right lateral leg just above the lateral malleolus. 10/25/16; her 3 open wounds are about the same in terms of dimensions however the surface appears a lot healthier post debridement. Using Iodoflex 10/18/16 we have been using Iodoflex to her wounds which she tolerated with some difficulty. 10/11/16; has been using Santyl for a period of time with some improvement although again very adherent surface slough would  prevent any attempted healing this. She has a original wound on the left calf, the satellite underneath that and the most recent wound  on the right medial ankle. She has completed revascularization by Dr. early and has had venous ablation earlier. Want to go back to Iodoflex to see if week and get a healthier surface to this wound bed failing this I think she'll need to be taken to the OR and I am prepared to call Dr. Marla Roe to discuss this. She is obviously not a good candidate for general anesthesia however.; 11/08/16; I put her on Iodoflex last time to see if I can get the wound bed any healthier and unfortunately today still had tremendous surface slough. 11/15/16; 4 weeks' worth of Iodoflex with not much improvement. Debridement on the major wounds on her left anterior leg is easier however this does not maintain from week to week. The punched-out area on her medial right ankle 11/29/16; I attempted to change the patient last visit to [facility] however she states this burned and was very uncomfortable therefore we gave her permission to go back to the eye out of complex which she already had at home. Also she noted a lot of pain and swelling on the lateral aspect of her leg before she traveled to [facility] for the holidays, I called her in doxycycline over the phone. This seems to have helped 12/06/16; Wounds unchanged by in large. Using Iodoflex 12/13/16; her wounds today actually looks somewhat better. The area on the right lateral lower leg has reasonably healthy-looking granulation and perhaps as actually filled and a bit. Debridement of the 2 wounds on the medial calf is easier and post debridement appears to have a healthier base. We have referred her to Dr. Migdalia Dk for consideration of operative debridement 12/20/16; we have a quick note from Dr. Merri Ray who feels that the patient needs to be referred to an academic center/plastic surgeon. This is due  to the complexity of the patient's medical issues as it applies to anything in the OR. We have been using Iodoflex 12/27/16; in general the wounds on the right leg are better in terms of the difficult to remove surface slough. She has been using Iodoflex. She is approved for Apligraf which I anticipated ordering in the next week or 2 when we get a better-looking surface 01/04/16 the deep wounds on the right leg generally look better. Both of them are debrided further surface slough. The area on the lateral right leg was not debrided. She is approved for Apligraf I think I'll probably order this either next week or the week after depending on the surface of the wounds superiorly. We have been using Iodoflex which will continue until then 01/11/16; the deep wounds on the right leg again have a surface slough that requires debridement. I've not been able to get the wound bed on either one of these wounds down to what would be acceptable for an advanced skin stab- like Apligraf. The area on the medial leg has been improving. We have been using hot Iodoflex to all wounds which seems to do the best at at least limiting the nonviable surface 01/24/17; we have continued Iodoflex and all her wound areas. Her debridement Gen. he is easier and the surface underneath this looks viable. Nevertheless these are large area wounds with exposed muscle at least on the anterior parts. We have ordered Apligraf's for 2 weeks from now. The patient will be away next week 02/07/17; the patient was close to have first Apligraf today however we did not order one. I therefore replaced her Iodoflex. She essentially has 3 large punched-out  areas on her right anterior leg and right medial ankle. 02/11/17; Apligraf #1 02/25/17; Apligraf #2. In general some improvement in the right medial ankle and right anterior leg wounds. The larger wound medially perhaps some better 03/11/17; Apligraf #3. In general continued improvement in the  right medial ankle and the right anterior leg wounds. 03/25/17 Apligraf #4. In general continued improvement especially on the right medial ankle and the lower 04/08/17; Apligraf #5 in general continued improvement in all wound sites. 04/22/17; post Apligraf #5 her wound beds continued to look a lot better all of them up to the surface of the surrounding skin. Had a caramel-colored slough that I did not debrided in case there is residual Apligraf effect. The wounds are as good as I have seen these looking quite some time. 04/29/17; we applied Aspinwall and last week after completing her Apligraf. Wounds look as though they've contracted somewhat although they have a nonviable surface which was problematic in the past. Apparently she has been approved for further Apligraf's. We applied Iodosorb today after debridement. 05/06/17; we're fortunate enough today to be able to apply additional Apligraf approved by her insurance. In general all of her wounds look better Apligraf #6 05/24/17; Apligraf #7 continued improvement in all wounds o3 06/10/17 Apligraf #8. Continued improvement in the surface of all wounds. Not much of an improvement in dimensions as I might a follow 06/24/17 Apligraf #9 continued general improvement although not as much change in the wound areas I might of like. She has a new open area on the right anterior lateral ankle very small and superficial. She also has a necrotic wound on the tip of her right index finger probably secondary to severe uncontrolled Raynaud's phenomenon. She is already on sildenafil and already seen her rheumatologist who gave her Keflex. 07/08/17; Apligraf #10. Generalized improvement although she has a small additional wound just medial to the major wound area. 07/22/17; after discussion we decided not to reorder any further Apligraf's although there is been considerable improvement with these it hasn't been recently. The major wound anteriorly looks better.  Smaller wounds beneath this and the more recent one and laterally look about the same. The area on the right lateral lower leg looks about the same 07/29/17 this is a patient who is exceedingly complex. She has advanced scleroderma, crest syndrome including calcinosis, PAD status post revascularization, chronic venous insufficiency status post ablations. She initially presented to this clinic with wounds on her bilateral lower legs however these closed. More recently we have been dealing with a large open area superiorly on the right anterior leg, a smaller wound underneath this and then another one on the right medial lower extremity. These improve significantly with 10 Apligraf applications. Over the last 2-3 weeks we are making good progress with Hydrofera Blue and these seem to be making progress towards closure 08/19/17; wounds continued to make good improvement with Hydrofera Blue and episodic aggressive debridements 08/26/17; still using Hydrofera Blue. Good improvements 09/09/17; using Hydrofera Blue continued improvement. Area on the lateral part of her right leg has only a small remaining open area. The small inferior satellite region is for all intents and purposes closed. Her major wound also is come in in terms of depth and has advancing epithelialization. 09/16/17; using Hydrofera Blue with continued improvement. The smaller satellite wound. We've closed out today along with a new were satellite wound medially. The area on her medial ankle is still open and her major wound is still open but  making improvement. All using Hydrofera Blue. Currently 09/30/17; using Hydrofera Blue. Still a small open area on the lateral right ankle area and her original major wound seems to be making gradual and steady improvement. 10/14/17; still using Hydrofera Blue. Still too small open areas on the right lateral ankle. Her original major wound is horizontal and linear. The most problematic area  paradoxically seems to be the area to the medial area wears I thought it would be the lateral. 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 10/28/17; still using Hydrofera Blue. Right lateral ankle has a very small open area superiorly on the most lateral part of the wound. Her original open wound has 2 open areas now separated by normal skin and we've redefined this. 11/11/17; still using Hydrofera Blue area and right lateral ankle continues to have a small open area on mostly the lateral part of the wound. Her original wound has 2 small open areas now separated by a considerable amount of normal skin 11/28/17; the patient called in slightly before Thanksgiving to report pain and erythema above the wound on the right leg. In the past this is responded well to treatment for cellulitis and I gave her over the phone doxycycline. She stated this resulted in fairly abrupt improvement. We have been using Hydrofera Blue for a prolonged period of time to the larger wound anteriorly into the remaining wound on her right right lateral ankle. The latter is just about closed with only a small linear area and the bottom of the [place]. 12/02/17; use endoform and left the dressing on since last visit. There is no tenderness and no evidence of infection. 12/16/17; patient has been using Endoform but not making much progress. The 2 punched out open areas anteriorly which were the reminiscence of her major wound appear deeper. The area on the lateral aspect of her right calf also appears deeper. Also she has a puzzling tender swelling above her wound on the right leg. This seems larger than last time. Just above her wounds there appears to be some fluctuance in this area it is not erythematous and there is no crepitus 12/30/17; patient has been using Endoform up until last week we used Hydrofera Blue. Ultrasound of the swelling above her 2 major wounds last time was  negative for a fluid collection. I gave her cefaclor for the erythema and tenderness in this area which seems better. Unfortunately both punched out areas anteriorly and the area on her right medial lower leg appear deeper. In fact the lateral of the wounds anteriorly actually looks as though it has exposed tendon and/or muscle sheath. She is not systemically unwell. She is complaining of vaginitis type symptoms presumably Candida from her antibiotics. 01/06/18; we're using santyl. she has 2 punched out areas anteriorly which were initially part of a large wound. Unfortunately medially this is now open to tendon/muscle. All the wounds have the same adherent very difficult to debride surface. 01/20/18; 2 week follow-up using Santyl. She has the 2 punched out areas anteriorly which were initially part of her large surface wound there. Medially this still has exposed muscle. All of these have the same tightly adherent necrotic surface which requires debridement. PuraPly was not accepted by the patient's insurance however her insurance I think it changed therefore we are going to run Apligraf to gain 02/03/18; the patient has been using Santyl. The wound on the right lateral ankle looks improved and the 2 areas anteriorly  on the right leg looks about the same. The medial one has exposed muscle. The lateral 1 requiresdebridement. We use PuraPly today for the first week 02/10/18; PuraPly #2. The patient has 3 wounds. The area on the right lateral ankle, 2 areas anteriorly that were part of her original large wound in this area the medial one has exposed muscle. All of the wounds were lightly debrided with a number 3 curet. PuraPly #2 applied the lateral wound on the calf and the right lateral ankle look better. 02/17/18; PuraPly #3. Patient has 3 wounds. The area on the right lateral ankle in 2 areas internally that were part of her original large wound. The lateral area has exposed muscle. She arrived with  some complaints of pain around the right ankle. 02/24/18; PuraPly #4; not much change in any of the 3 wound areas. Right lateral ankle, right lateral calf. Both of these required debridement with a #3 curet. She tolerates this marginally. The area on the medial leg still has exposed muscle. Not much change in dimensions 03/03/18 PuraPly #5. The area on the medial ankle actually looks better however the 2 separate areas that were original parts of the larger right anterior leg wound look as though they're attempting to coalesce. 03/10/18; PuraPly #6. The area on the medial ankle actually continues to look and measures smaller however the 2 separate areas that were part of the original large wound on the right anterior leg have now coalesced. There hasn't been much improvement here. The lateral area actually has underlying exposed muscle 03/17/18-she is here in follow-up evaluation for ulcerations to the right lower extremity. She is voicing no complaints or concerns. She tolerated debridement. Puraply#7 placed 03/24/18; difficult right lower extremity ulcerations. PuraPly #8 place. She is been approved for [employer]. She did very well with Apligraf today however she is apparently reached her "lifetime max" 03/31/18; marginal improvement with PuraPly although her wounds looked as good as they have in several weeks today. Used TheraSkins #1 04/14/18 TheraSkin #2 today 04/28/18 TheraSkins #3. Wound slightly improved 05/12/18; TheraSkin skin #4. Wound response has been variable. 05/27/18 TheraSkin #5. Generally improvement in all wound areas. I've also put her in 3 layer compression to help with the severe venous hypertension 06/09/18; patient is done quite well with the TheraSkins unfortunately we have no further applications. I also put her in 3 layer compression last week and that really seems to of helped. 06/16/18; we have been using silver collagen. Wounds are smaller. Still be open area to the muscle  layer of her calf however even that is contracted somewhat. She tells me that at night sometimes she has pain on the right lateral calf at the site of her lower wound. Notable that I put her into 3 layer compression about 3 weeks ago. She states that she dangles her leg over the bed that makes it feel better but she does not describe claudication during the day ooShe is going to call her secondary insurance to see if they will continue to cover advanced treatment products I have reviewed her arterial studies from 01/22/17; this showed an ABI in the right of 1 and on the left noncompressible. TBI on the right at 0.30 on the left at 0.34. It is therefore possible she has significant PAD with medial calcification falsely elevating her ABI into the normal range. I'll need to be careful about asking her about this next week it's possible the 3 layer compression is too much 06/23/18; was able to  reapply TheraSkin 1 today. Edema control is good and she is not complaining of pain no claudication 07/07/18;no major change. New wound which was apparently a taper removal injury today in our clinic between her 2 wounds on the right calf TheraSkins #2 07/14/18; I think there is some improvement in the right lateral ankle and the medial part of her wound. There is still exposed muscle medially. 07/28/18; two-week follow-up. TheraSkins#3. Unfortunately no major change. She is not a candidate I don't think for skin grafting due to severe venous hypertension associated with her scleroderma and pulmonary hypertension 08/11/18 Patient is here today for her [employer] application #53 (#5 of the second set). She seems to be doing well and in the base of the wound appears to show some progress at this point. This is the last approved [employer]in of the second set. 08/25/18; she has completed TheraSkin. There has been some improvement on the right lateral calf wound as well as the anterior leg wounds. The open area to muscle  medially on the anterior leg wound is smaller. I'm going to transition her back to [facility] [place] under Kerlix Coban change every second day. She reports that she had some calcification removed from her right upper arm. We have had previous problems with calcifications in her wounds on her legs but that has not happened recently 09/08/18;using Hydrofera Blue on both her wound areas. Wounds seem to of contracted nicely. She uses Kerlix Coban wrap and changes at home herself 09/22/2018; using Hydrofera Blue on both her wound areas. Dimensions seem to have come down somewhat. There is certainly less depth in the medial part of the mid tibia wound and I do not think there is any exposed muscle at this point. 10/06/2018; 2-week follow-up. Using [facility] on her wound areas. Dimensions have come down nicely both on the right lateral ankle area in the right mid tibial area. She has no new complaints 10/20/2018; 2-week follow-up. She is using Hydrofera Blue. Not much change from the last time she was here. The area on the lateral ankle has less depth although it has raised edges on one side. I attempted to remove as much of the raised edge as I could without creating more additional wounds. The area on the right anterior mid tibia area looks the same. 11/03/2018; 2-week follow-up she is using Hydrofera Blue. On the right anterior leg she now has 2 wounds separated by a large area of normal skin. The area on the medial part still has I think exposed muscle although this area itself is a lot smaller. The area laterally has some depth. Both areas with necrotic debris. ooThe area on her right lateral ankle has come in nicely 11/17/2018; patient continues to use Hydrofera Blue. We have been increasing separation of the 2 wounds anteriorly which were at one point joint the area on the right lateral calf continues to have I think some improvement in depth. 12/08/2018; patient continues to use Hydrofera Blue.  There is some improvement in the area on the right lateral calf. The 2 areas that were initially part of the original large wound in the mid right tibia are probably about the same. In fact the medial area is probably somewhat larger. We will run puraply through the patient's insurance 12/22/2018; she has been using Hydrofera Blue. We have small wounds on the right lateral calf and 2 small areas that were initially part of a large wound in the right mid tibia. We applied pure apply #1 today.  12/29/2018; we applied puraply #2. Her wounds look somewhat better especially on the right lateral calf and the lateral part of her original wound in the mid tibial area. 01/05/2019; perhaps slightly improved in terms of wound bed condition but certainly not as much improvement as I might of liked. Puraply #3 1/13: we did not have a correct sized puraply to apply. wounds more pinched out looking, I increased her compression to 3 layer last week to help with significant multilevel venous hypertension. Since then I've reviewed her arterial status. She has a right femoral endarterectomy and a distal left SFA stent. She was being followed by Dr. [name] for a period however she does not appear to have seen him in 3 years. I will set up an appointment. 1/20. The patient has an additional wound on the right lateral calf between the distal wound and proximal wounds. We did not have Puraply last week. Still does not have a follow-up with Dr. early 1/27: Follow-up with Dr. early has been arranged apparently with follow-up noninvasive studies. Wounds are measuring roughly the same although they certainly look smaller 2/3; the patient had non invasive studies. Her ABIs on the right were 0.83 and on the left 1.02 however there was no great toe pressure bilaterally. Also worrisome monophasic waveforms at the PTA and dorsalis pedis. We are still using Puraply. We have had some improvement in all of the wounds especially the  lateral part of the mid tibial area. 2/10; sees Dr. early of vein and vascular re-arterial studies next week. Puraply reapplied today. 2/17; Dr. early of vein and vascular his appointment is tomorrow. Puraply reapplied after debridement of all wounds 2/24; the patient saw Dr. early I reviewed his note. sHe noted the previous right femoral endarterectomy with a Dacron patch. He also noted the normal ABI and the monophasic waveforms suggesting tibial disease. Overall he did not feel that she had any evidence of arterial insufficiency that would impair her wound healing. He did note her venous disease as well. He suggested PRN follow-up. 3/2; I had the last puraply applied today. The original wounds over the mid tibia area are improved where is the area on the right lower leg is not 3/9; wounds are smaller especially in the right mid tibia perhaps slightly in the right lateral calf. We finished with puraply and went to endoform today 3/23; the patient arrives after 2 weeks. She has been using endoform. I think all of her wounds look slightly better which includes the area on the right lateral calf just above the right lateral malleolus and the 2 in the right mid tibia which were initially part of the same wound. 4/27; TELEHEALTH visit; the patient was seen for telehealth visit today with her consent in the middle of the worldwide epidemic. Since she was last here she called in for antibiotics with pain and tenderness around the area on the right medial ankle. I gave her empiric doxycycline. She states this feels better. She is using endoform on both of these areas 5/11 TELEHEALTH; the patient was seen for telehealth visit today. She was accompanied at home by her husband. She has severe pulmonary hypertension accompanied scleroderma and in the face of the Covid epidemic cannot be safely brought into our clinic. We have been using endoform on her wound areas. There are essentially 3 wound areas now  in the left mid tibia now 2 open areas that it one point were connected and one on the right lateral ankle just above  the malleolus. 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 She cannot come to clinic. The wounds were reviewed today via telehealth. She has severe chronic venous hypertension which I think is centrally mediated. She has wounds on her right anterior tibia and right lateral ankle area. These are chronic. She has been using endoform. 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 We have been following her from telehealth. She has severe chronic venous hypertension which may be mostly centrally mediated secondary to right heart heart failure. She has wounds on her right anterior tibia and right lateral ankle these are chronic we have been using endoform 6/22; TELEHEALTH; this patient was seen today via telehealth. 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 Since the last time we had contact with this patient she developed some pain and erythema around the wound on her right lateral malleolus/ankle and we put in antibiotics for her. This is resulted in good improvement with resolution of the erythema and tenderness. I changed her to silver alginate last time. We had been using endoform for an extended period of time 7/13; TELEHEALTH; this patient was seen today via telehealth. 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 She would continue to be at a prohibitive risk to be brought into our clinic unless this was absolutely necessary. These visits have been  done with her approval as well as her husband. We have been using silver alginate to the areas on the right mid tibia and right lateral lower leg. 7/27 TELEHEALTH; patient was seen along with her husband today via telehealth. 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 We changed her to sample last visit. She has 2 areas a chronic wound on her right mid tibia and one just above her ankle. These were not the original wounds when she came into this clinic but she developed them during treatment 8/17; she comes in for her first face-to-face visit in a long period. She has a remaining area just medial to the right tibia which is the last open part of her large wound across this area. She also has an area on the right lateral lower leg. We prescribed Santyl last telehealth visit but they were concerned that this was making a deeper so they put silver alginate on it last week. Her husband changes the dressings. 8/31; using Santyl to the 2 wound areas some improvement in wound surfaces. Husband has surgery in 2 weeks we will put her out 3 weeks. Any of the advanced treatment options that I can think of that would be eligible for this wound would also cause her to have to come in weekly. The risk that the patient is just too high 9/21. Using Santyl to the 2 wound areas. Both of these are somewhat better although the medial mid tibia area still has exposed muscle. Lateral ankle requiring debridement. Using Santyl 10/12; using Santyl to the 2 wound areas. One on the right lateral ankle and the other in the medial calf which still has exposed muscle. Both areas have  come down in size and have better looking surfaces. She has made nice progress with santyl 11/2; we have been using Santyl to the 2 wound areas. Right lateral ankle and the other in the mid tibia area the medial part of this still has exposed  muscle. 11/23/2019 on evaluation today patient appears to be doing about the same really with regard to her wounds. She is actually not very pleased with how things seem to be progressing at this point she tells me that she really has not noted much improvement unfortunately. With that being said there is no signs of active infection at this time. There is some slough buildup noted at this time which again along with some dry skin around the edges of the wound I think would benefit her to try to debride some of this away. Fortunately her pain is doing fairly well. She still has exposed muscle in the right medial/tibial area. 12/14; TELEHEALTH; she was changed to [facility] to the right calf wound and right lateral ankle wound when she was here last time. Unfortunately since then she had a fall with a pelvic fracture and a fracture of her wrist. She was apparently hospitalized for 5 days. I have not looked at her discharge summary. She apparently came out of the hospital with a blister on her right heel. She was seen today via telehealth by myself and our case manager. The patient and her husband were present. She has been using Hydrofera Blue at our direction from the last time she was in the clinic. There is been no major improvement in fact the areas appear deeper and with a less viable surface 01/04/2020; TELEHEALTH; the patient was seen today in accompaniment of her husband and our nurse. She has 3 open areas 1 on the right medial mid tibia, one on the right lateral ankle and a large eschared pressure area on the right heel. We have been using Iodoflex to the 2 original wounds. 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 It is simply too perilous for her to be seen in any other way 1/26; TELEHEALTH; the patient was seen today in accompaniment of her husband and our RN one of our nurses. She still has the 3 open areas 1 on the right medial mid tibia which is  the remanent of a more extensive wound in this area, one on the right lateral ankle and a large eschared pressure area on the right heel. We have been using Iodoflex to the 2 original wounds and a bed at 9 application to the eschared area on the heel 2/15; the first time we have seen this patient and then several months out of concern for the pandemic. She had a large horizontal wound in the mid tibia. Only the medial aspect of this is still open. Area on the lateral ankle is just about closed. She had a new pressure ulcer on the right heel which I have removed some of the eschar. We have been using Iodoflex which I will continue. The area in the mid tibia has a round circle in the middle of exposed muscle. I think we would have to use an advanced treatment product to stimulate granulation over this area. We will run this through her insurance. She is not eligible for plastic surgery for many reasons Objective Constitutional Patient is hypertensive.. Pulse regular and within target range for patient.Marland Kitchen Respirations regular, non-labored and within target range.. Temperature is normal and within the target range for  the patient.Marland Kitchen Appears in no distress. Vitals Time Taken: 8:18 AM, Height: 68 in, Weight: 132 lbs, BMI: 20.1, Temperature: 97.8 F, Pulse: 70 bpm, Respiratory Rate: 18 breaths/min, Blood Pressure: 143/44 mmHg. General Notes: Wound exam; the area in the mid tibia still has a small area of exposed muscle the surrounding granulation however looks better. ooRight lateral ankle only has a minuscule area even looks open here. I removed some callus and skin everything else looks closed ooBlack eschar over the right heel I removed most of this with pickups and a #15 blade we will use Iodoflex on this area as well Integumentary (Hair, Skin) Wound #16 status is Open. Original cause of wound was Pressure Injury. The wound is located on the Right Calcaneus. The wound measures 1.5cm length x  2.4cm width x 0.1cm depth; 2.827cm^2 area and 0.283cm^3 volume. There is no tunneling or undermining noted. There is a none present amount of drainage noted. The wound margin is flat and intact. There is no granulation within the wound bed. There is a large (67-100%) amount of necrotic tissue within the wound bed including Eschar. Wound #5 status is Open. Original cause of wound was Gradually Appeared. The wound is located on the Right,Medial Lower Leg. The wound measures 1cm length x 1.4cm width x 0.5cm depth; 1.1cm^2 area and 0.55cm^3 volume. There is muscle and Fat Layer (Subcutaneous Tissue) Exposed exposed. There is no tunneling or undermining noted. There is a medium amount of serosanguineous drainage noted. The wound margin is well defined and not attached to the wound base. There is no granulation within the wound bed. There is a large (67- 100%) amount of necrotic tissue within the wound bed including Adherent Slough. Wound #7 status is Open. Original cause of wound was Gradually Appeared. The wound is located on the Right,Lateral Malleolus. The wound measures 0.1cm length x 0.1cm width x 0.1cm depth; 0.008cm^2 area and 0.001cm^3 volume. There is no tunneling or undermining noted. There is a none present amount of drainage noted. The wound margin is well defined and not attached to the wound base. There is no granulation within the wound bed. There is no necrotic tissue within the wound bed. 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 There was a Selective/Open Wound Non-Viable Tissue Debridement with a total area of 3.6 sq cm performed by Ricard Dillon., MD. With the following instrument(s): Blade, and Forceps to remove Non-Viable tissue/material. Material removed includes Eschar after achieving pain control using Other (Benzocaine 20%). No specimens were taken. A time out was conducted at 08:58, prior to the start of the procedure. A Minimum amount of bleeding was controlled with Pressure. The procedure was tolerated well with a pain level of 3 throughout and a pain level of 0 following the procedure. Post Debridement Measurements: 1.5cm length x 2.4cm width x 0.1cm depth; 0.283cm^3 volume. Post debridement Stage noted as Unstageable/Unclassified. Character of Wound/Ulcer Post Debridement requires further debridement. Post procedure Diagnosis Wound #16: Same as Pre-Procedure Plan Follow-up Appointments: Return Appointment in: - Televisit in 2 weeks Dressing Change Frequency: Wound #16 Right Calcaneus:  Change Dressing every other day. Wound #5 Right,Medial Lower Leg: Change Dressing every other day. Wound #7 Right,Lateral Malleolus: Change Dressing every other day. Skin Barriers/Peri-Wound Care: Moisturizing lotion - both legs Wound Cleansing: May shower and wash wound with soap and water. - on days that dressing is changed Primary Wound Dressing: Wound #16 Right Calcaneus: Iodoflex Wound #5 Right,Medial Lower Leg: Iodoflex Wound #7 Right,Lateral Malleolus: Iodoflex Secondary Dressing: Wound #16 Right Calcaneus: Dry Gauze Heel Cup Wound #5 Right,Medial Lower Leg: Dry Gauze Wound #7 Right,Lateral Malleolus: Dry Gauze Edema Control: Kerlix and Coban - Right Lower Extremity Avoid standing for long periods of time Elevate legs to the level of the heart or above for 30 minutes daily and/or when sitting, a frequency of: - throughout the day Off-Loading: Turn and reposition every 2  hours Other: - float heels off of bed/chair with pillow under calves Home Health: [facility] for [facility]. - [facility] 1. I am going to use Iodoflex to all wounds 2. Apligraf through her insurance vis--vis the area that is open to muscle 3. Telehealth visit in 2 weeks. Will discuss whether Apligraf will be covered and whether we can bring her in every other week. Electronic Signature(s) Signed: 02/15/2020 6:16:05 PM By: Linton Ham MD Entered By: Linton Ham on 02/15/2020 09:23:47 -------------------------------------------------------------------------------- SuperBill Details Patient Name: Date of Service: [name] [name] 02/15/2020 Medical Record Number:[id number] Patient Account Number: [id number] Date of Birth/Sex: Treating RN: [birth year] (69 y.o. 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 I87.331 Chronic venous hypertension (idiopathic) with ulcer and inflammation of right lower extremity L89.616 Pressure-induced deep tissue damage of right heel Facility Procedures CPT4 Code: 15520802 Description: 23361 - DEBRIDE WOUND 1ST 20 SQ CM OR < ICD-10 Diagnosis Description L89.616 Pressure-induced deep tissue damage of right heel Modifier: Quantity: 1 Physician Procedures CPT4 Code: 2244975 Description: 30051 - WC PHYS DEBR WO ANESTH 20 SQ CM ICD-10 Diagnosis Description ICD-10 Diagnosis Description L89.616 Pressure-induced deep tissue damage of right heel Modifier: Quantity: 1 Electronic Signature(s) Signed: 02/15/2020  6:16:05 PM By: Linton Ham MD Entered By: Linton Ham on 02/15/2020 09:23:11

## 2020-02-15 NOTE — Progress Notes (Signed)
[name] [name] ([id number]) Visit Report for 02/15/2020 Arrival Information Details Patient Name: Date of Service: [name] [name], [name] [name] 02/15/2020 8:00 AM Medical Record Number:[id number] Patient Account Number: [id number] Date of Birth/Sex: Treating RN: 1951/01/23 (69 y.o. [name] [name] Primary Care Emori Kamau: [name] Other Clinician: Referring Hae Ahlers: Treating Namiko Pritts/Extender:Robson, Leotis Shames, MARY Weeks in Treatment: 59 Visit Information History Since Last Visit Added or deleted any medications: No Patient Arrived: Wheel Chair Any new allergies or adverse reactions: No Arrival Time: 08:15 Had a fall or experienced change in No activities of daily living that may affect Accompanied By: husband risk of falls: Transfer Assistance: None Signs or symptoms of abuse/neglect since last No Patient Identification Verified: Yes visito Secondary Verification Process Completed: Yes Hospitalized since last visit: Yes Patient Requires Transmission-Based No Implantable device outside of the clinic excluding No Precautions: cellular tissue based products placed in the center Patient Has Alerts: No since last visit: Has Dressing in Place as Prescribed: Yes Has Compression in Place as Prescribed: Yes Pain Present Now: No Electronic Signature(s) Signed: 02/15/2020 6:19:44 PM By: Deon Pilling Entered By: Deon Pilling on 02/15/2020 08:26:58 -------------------------------------------------------------------------------- Encounter Discharge Information Details Patient Name: Date of Service: [name] [name] 02/15/2020 8:00 AM Medical Record Number:[id number] Patient Account Number: [id number] Date of Birth/Sex: Treating RN: 03/11/1951 (69 y.o. [name] [name] Primary Care Shristi Scheib: [name] Other Clinician: Referring Jiles Goya: Treating Demetrias Goodbar/Extender:Robson, Leotis Shames, MARY Weeks in Treatment: [phone] Encounter Discharge Information Items Post Procedure  Vitals Discharge Condition: Stable Temperature (F): 97.8 Ambulatory Status: Wheelchair Pulse (bpm): 70 Discharge Destination: Home Respiratory Rate (breaths/min): 18 Transportation: Private Auto Blood Pressure (mmHg): 143/44 Accompanied By: husband Schedule Follow-up Appointment: Yes Clinical Summary of Care: Electronic Signature(s) Signed: 02/15/2020 6:19:44 PM By: Deon Pilling Entered By: Deon Pilling on 02/15/2020 09:23:54 -------------------------------------------------------------------------------- Lower Extremity Assessment Details Patient Name: Date of Service: [name] [name] 02/15/2020 8:00 AM Medical Record Number:[id number] Patient Account Number: [id number] Date of Birth/Sex: Treating RN: [birth year] (69 y.o. [name] [name] Primary Care Eura Mccauslin: [name] Other Clinician: Referring Letasha Kershaw: Treating Ayaat Jansma/Extender:Robson, Leotis Shames, MARY Weeks in Treatment: 412 Edema Assessment Assessed: [Left: No] [Right: Yes] Edema: [Left: Ye] [Right: s] Calf Left: Right: Point of Measurement: 36 cm From Medial Instep cm 32 cm Ankle Left: Right: Point of Measurement: 10 cm From Medial Instep cm 22 cm Electronic Signature(s) Signed: 02/15/2020 6:19:44 PM By: Deon Pilling Entered By: Deon Pilling on 02/15/2020 08:27:08 -------------------------------------------------------------------------------- Multi Wound Chart Details Patient Name: Date of Service: [name] [name] 02/15/2020 8:00 AM Medical Record Number:[id number] Patient Account Number: [id number] Date of Birth/Sex: Treating RN: April 26, 1951 (69 y.o. 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] [7:Venous Leg Ulcer] Comorbid History: [16:Anemia, Hypertension, Peripheral Arterial Disease, Peripheral Arterial Disease, Peripheral Arterial Disease, Peripheral Venous Disease, Peripheral Venous Disease, Peripheral Venous Disease, Raynauds, Scleroderma, Raynauds,  Scleroderma, Raynauds, Scleroderma, Rheumatoid Arthritis, Osteoarthritis] [5:Anemia, Hypertension, Rheumatoid Arthritis, Osteoarthritis] [7:Anemia, Hypertension, Rheumatoid Arthritis, Osteoarthritis] Date Acquired: [16:01/04/2020] [5:01/13/2013] [7:06/25/2016] Weeks of Treatment: [16:6] [5:368] [7:188] Wound Status: [16:Open] [5:Open] [7:Open] Clustered Wound: [16:No] [5:Yes] [7:No] Measurements L x W x D 1.5x2.4x0.1 [5:1x1.4x0.5] [7:0.1x0.1x0.1] (cm) Area (cm) : [  16:2.827] [5:1.1] [7:0.008] Volume (cm) : [16:0.283] [5:0.55] [7:0.001] % Reduction in Area: [16:77.50%] [5:22.20%] [7:99.70%] % Reduction in Volume: 77.50% [5:-290.10%] [7:99.60%] Classification: [16:Unstageable/Unclassified Full Thickness With] [5:Exposed Support Structures Exposed Support Structures] [7:Full Thickness Without] Exudate Amount: [16:None Present] [5:Medium] [7:None Present] Exudate Type: [16:N/A] [5:Serosanguineous] [7:N/A] Exudate Color: [16:N/A] [5:red, brown] [7:N/A] Wound Margin: [16:Flat and Intact] [5:Well defined, not attached Well defined, not attached] Granulation Amount: [16:None Present (0%)] [5:None Present (0%)] [7:None Present (0%)] Necrotic Amount: [16:Large (67-100%)] [5:Large (67-100%)] [7:None Present (0%)] Necrotic Tissue: [16:Eschar] [5:Adherent Slough] [7:N/A] Exposed Structures: [16:Fascia: No Fat Layer (Subcutaneous Tissue) Exposed: Yes Tissue) Exposed: No  Tendon: No Muscle: No Joint: No Bone: No] [5:Fat Layer (Subcutaneous Fascia: No Muscle: Yes Fascia: No Tendon: No Joint: No Bone: No] [7:Fat Layer (Subcutaneous Tissue)  Exposed: No Tendon: No Muscle: No Joint: No Bone: No] Epithelialization: [16:None] [5:Small (1-33%)] [7:Large (67-100%)] Debridement: [16:Debridement - Selective/Open Wound] [5:N/A] [7:N/A] Pre-procedure [16:08:58] [5:N/A] [7:N/A] Verification/Time Out Taken: Pain Control: [16:Other] [5:N/A] [7:N/A] Tissue Debrided: [16:Necrotic/Eschar] [5:N/A] [7:N/A] Level: [16:Non-Viable Tissue] [5:N/A] [7:N/A] Debridement Area (sq cm):3.6 [5:N/A] [7:N/A] Instrument: [16:Blade, Forceps] [5:N/A] [7:N/A] Bleeding: [16:Minimum] [5:N/A] [7:N/A] Hemostasis Achieved: [16:Pressure] [5:N/A] [7:N/A] Procedural Pain: [16:3] [5:N/A] [7:N/A] Post Procedural Pain: [16:0] [5:N/A] [7:N/A] Debridement Treatment [16:Procedure was tolerated] [5:N/A] [7:N/A] Response: [16:well] Post Debridement [16:1.5x2.4x0.1] [5:N/A] [7:N/A] Measurements L x W x D (cm) Post Debridement [16:0.283] [5:N/A] [7:N/A] Volume: (cm) Post Debridement Stage: [16:Unstageable/Unclassified Debridement] [5:N/A N/A] [7:N/A N/A] Treatment Notes Electronic Signature(s) Signed: 02/15/2020 6:14:31 PM By: Levan Hurst RN, BSN Signed: 02/15/2020 6:16:05 PM By: Linton Ham MD Entered By: Linton Ham on 02/15/2020 09:19:22 -------------------------------------------------------------------------------- Multi-Disciplinary Care Plan Details Patient Name: Date of Service: [name] [name]. 02/15/2020 8:00 AM Medical Record Number:[id number] Patient Account Number: [id number] Date of Birth/Sex: Treating RN: 11-09-1951 (69 y.o. [name] [name] Primary Care Ermal Brzozowski: [name] Other Clinician: Referring Towanda Hornstein: Treating Roben Tatsch/Extender:Robson, Leotis Shames, MARY Weeks in Treatment: [phone] Active Inactive Wound/Skin Impairment Nursing Diagnoses: Impaired tissue  integrity Knowledge deficit related to ulceration/compromised skin integrity Goals: Patient/caregiver will verbalize understanding of skin care regimen Date Initiated: 10/11/2016 Target Resolution Date: 03/11/2020 Goal Status: Active Interventions: Assess patient/caregiver ability to obtain necessary supplies Assess patient/caregiver ability to perform ulcer/skin care regimen upon admission and as needed Assess ulceration(s) every visit Provide education on ulcer and skin care Treatment Activities: Skin care regimen initiated : 10/11/2016 Topical wound management initiated : 10/11/2016 Notes: Electronic Signature(s) Signed: 02/15/2020 6:14:31 PM By: Levan Hurst RN, BSN Entered By: Levan Hurst on 02/15/2020 08:29:54 -------------------------------------------------------------------------------- Pain Assessment Details Patient Name: Date of Service: [name] [name] 02/15/2020 8:00 AM Medical Record Number:[id number] Patient Account Number: [id number] Date of Birth/Sex: Treating RN: [birth year] (69 y.o. [name], Meta.Reding Primary Care Charniece Venturino: [name] Other Clinician: Referring Lawonda Pretlow: Treating Mable Lashley/Extender:Robson, Leotis Shames, MARY Weeks in Treatment: [phone] Active Problems Location of Pain Severity and Description of Pain Patient Has Paino No Site Locations Pain Management and Medication Current Pain Management: Medication: No Cold Application: No Rest: No Massage: No Activity: No T.E.N.S.: No Heat Application: No Leg drop or elevation: No Is the Current Pain Management Adequate: Adequate How does your wound impact your activities of daily livingo Sleep: No Bathing: No Appetite: No Relationship With Others: No Bladder Continence: No Emotions: No Bowel Continence: No Work: No Toileting: No Drive: No Dressing: No Hobbies: No Electronic Signature(s) Signed: 02/15/2020 6:19:44 PM By: Deon Pilling Entered By: Deon Pilling on 02/15/2020  08:26:50 -------------------------------------------------------------------------------- Patient/Caregiver Education Details Patient Name: Date of Service: [name] [name], [name] F. 2/15/2021andnbsp8:00  AM Medical Record Number:[id number] Patient Account Number: [id number] Date of Birth/Gender: 28-Jan-1951 (69 y.o. F) Treating RN: Levan Hurst Primary Care Physician: [name] Other Clinician: Referring Physician: Treating Physician/Extender:Robson, Leotis Shames, Timoteo Expose in Treatment: 5 Education Assessment Education Provided To: Patient Education Topics Provided Wound/Skin Impairment: Methods: Explain/Verbal Responses: State content correctly Electronic Signature(s) Signed: 02/15/2020 6:14:31 PM By: Levan Hurst RN, BSN Entered By: Levan Hurst on 02/15/2020 08:30:32 -------------------------------------------------------------------------------- Wound Assessment Details Patient Name: Date of Service: [name] [name], [name] F. 02/15/2020 8:00 AM Medical Record Number:[id number] Patient Account Number: [id number] Date of Birth/Sex: Treating RN: 27-Mar-1951 (69 y.o. [name] [name] Primary Care Benancio Osmundson: [name] Other Clinician: Referring Aimar Borghi: Treating Marigene Erler/Extender:Robson, Leotis Shames, MARY Weeks in Treatment: 412 Wound Status Wound Number: 16 Primary Pressure Ulcer Etiology: Wound Location: Right Calcaneus Wound Open Wounding Event: Pressure Injury Status: Date Acquired: 01/04/2020 Comorbid Anemia, Hypertension, Peripheral Arterial Weeks Of Treatment: 6 History: Disease, Peripheral Venous Disease, Clustered Wound: No Raynauds, Scleroderma, Rheumatoid Arthritis, Osteoarthritis Photos Wound Measurements Length: (cm) 1.5 % Re Width: (cm) 2.4 % Re Depth: (cm) 0.1 Epit Area: (cm) 2.827 Tun Volume: (cm) 0.283 Und Wound Description Classification: Unstageable/Unclassified Wound Margin: Flat and Intact Exudate Amount: None Present Wound Bed Granulation  Amount: None Present (0%) Necrotic Amount: Large (67-100%) Necrotic Quality: Eschar Foul Odor After Cleansing: No Slough/Fibrino No Exposed Structure Fascia Exposed: No Fat Layer (Subcutaneous Tissue) Exposed: No Tendon Exposed: No Muscle Exposed: No Joint Exposed: No Bone Exposed: No duction in Area: 77.5% duction in Volume: 77.5% helialization: None neling: No ermining: No Treatment Notes Wound #16 (Right Calcaneus) 1. Cleanse With Wound Cleanser Soap and water 2. Periwound Care Moisturizing lotion 3. Primary Dressing Applied Iodoflex 4. Secondary Dressing Dry Gauze 6. Support Layer Applied Kerlix/Coban Notes patient declined heel cup per patient unable to get shoe on. explained the importance of heel cup. patient still declined. MD and case manager made aware. Electronic Signature(s) Signed: 02/15/2020 4:24:03 PM By: Mikeal Hawthorne EMT/HBOT Signed: 02/15/2020 6:14:31 PM By: Levan Hurst RN, BSN Entered By: Mikeal Hawthorne on 02/15/2020 15:26:10 -------------------------------------------------------------------------------- Wound Assessment Details Patient Name: Date of Service: [name] [name] 02/15/2020 8:00 AM Medical Record Number:[id number] Patient Account Number: [id number] Date of Birth/Sex: Treating RN: May 26, 1951 (69 y.o. [name] [name] Primary Care Citlali Gautney: [name] Other Clinician: Referring Kaleia Longhi: Treating Markevion Lattin/Extender:Robson, Leotis Shames, MARY Weeks in Treatment: 412 Wound Status Wound Number: 5 Primary Venous Leg Ulcer Etiology: Wound Location: Right Lower Leg - Medial Wound Open Wounding Event: Gradually Appeared Status: Date Acquired: 01/13/2013 Comorbid Anemia, Hypertension, Peripheral Arterial Weeks Of Treatment: 368 History: Disease, Peripheral Venous Disease, Clustered Wound: Yes Raynauds, Scleroderma, Rheumatoid Arthritis, Osteoarthritis Photos Wound Measurements Length: (cm) 1 % Reduction Width: (cm) 1.4 %  Reduction Depth: (cm) 0.5 Epitheliali Area: (cm) 1.1 Tunneling: Volume: (cm) 0.55 Underminin Wound Description Classification: Full Thickness With Exposed Support Foul Odor Structures Slough/Fib Wound Well defined, not attached Margin: Exudate Medium Amount: Exudate Type: Serosanguineous Exudate Color: red, brown Wound Bed Granulation Amount: None Present (0%) Necrotic Amount: Large (67-100%) Fascia Expo Necrotic Quality: Adherent Slough Fat Layer ( Tendon Expo Muscle Expo Necros Joint Expos Bone After Cleansing: No rino Yes Exposed Structure sed: No Subcutaneous Tissue) Exposed: Yes sed: No sed: Yes is of Muscle: No ed: No Exposed: No in Area: 22.2% in Volume: -290.1% zation: Small (1-33%) No g: No Treatment Notes Wound #5 (Right, Medial Lower Leg) 1. Cleanse With Wound Cleanser Soap and water 2. Periwound Care Moisturizing lotion 3. Primary Dressing Applied Iodoflex 4. Secondary Dressing  Dry Gauze 6. Support Layer Applied Kerlix/Coban Notes patient declined heel cup per patient unable to get shoe on. explained the importance of heel cup. patient still declined. MD and case manager made aware. Electronic Signature(s) Signed: 02/15/2020 4:24:03 PM By: Mikeal Hawthorne EMT/HBOT Signed: 02/15/2020 6:14:31 PM By: Levan Hurst RN, BSN Entered By: Mikeal Hawthorne on 02/15/2020 15:30:49 -------------------------------------------------------------------------------- Wound Assessment Details Patient Name: Date of Service: [name] [name] 02/15/2020 8:00 AM Medical Record Number:[id number] Patient Account Number: [id number] Date of Birth/Sex: Treating RN: 09-02-1951 (69 y.o. [name] [name] Primary Care Jalil Lorusso: [name] Other Clinician: Referring Jarmarcus Wambold: Treating Maraki Macquarrie/Extender:Robson, Leotis Shames, MARY Weeks in Treatment: 412 Wound Status Wound Number: 7 Primary Venous Leg Ulcer Etiology: Wound Location: Right Malleolus - Lateral Wound  Open Wounding Event: Gradually Appeared Status: Date Acquired: 06/25/2016 Comorbid Anemia, Hypertension, Peripheral Arterial Weeks Of Treatment: 188 History: Disease, Peripheral Venous Disease, Clustered Wound: No Raynauds, Scleroderma, Rheumatoid Arthritis, Osteoarthritis Photos Wound Measurements Length: (cm) 0.1 % Re Width: (cm) 0.1 % Re Depth: (cm) 0.1 Epit Area: (cm) 0.008 Tun Volume: (cm) 0.001 Und Wound Description Classification: Full Thickness Without Exposed Support Structures Wound Well defined, not attached Margin: Exudate None Present Amount: Wound Bed Granulation Amount: None Present (0%) Necrotic Amount: None Present (0%) Foul Odor After Cleansing: No Slough/Fibrino No Exposed Structure Fascia Exposed: No Fat Layer (Subcutaneous Tissue) Exposed: No Tendon Exposed: No Muscle Exposed: No Joint Exposed: No Bone Exposed: No duction in Area: 99.7% duction in Volume: 99.6% helialization: Large (67-100%) neling: No ermining: No Treatment Notes Wound #7 (Right, Lateral Malleolus) 1. Cleanse With Wound Cleanser Soap and water 2. Periwound Care Moisturizing lotion 3. Primary Dressing Applied Iodoflex 4. Secondary Dressing Dry Gauze 6. Support Layer Applied Kerlix/Coban Notes patient declined heel cup per patient unable to get shoe on. explained the importance of heel cup. patient still declined. MD and case manager made aware. Electronic Signature(s) Signed: 02/15/2020 4:24:03 PM By: Mikeal Hawthorne EMT/HBOT Signed: 02/15/2020 6:14:31 PM By: Levan Hurst RN, BSN Entered By: Mikeal Hawthorne on 02/15/2020 15:27:17 -------------------------------------------------------------------------------- Vitals Details Patient Name: Date of Service: [name] [name]. 02/15/2020 8:00 AM Medical Record Number:[id number] Patient Account Number: [id number] Date of Birth/Sex: Treating RN: 1951-08-08 (69 y.o. [name] [name] Primary Care Keymarion Bearman: [name] Other Clinician: Referring Avir Deruiter: Treating Gaege Sangalang/Extender:Robson, Leotis Shames, MARY Weeks in Treatment: 412 Vital Signs Time Taken: 08:18 Temperature (F): 97.8 Height (in): 68 Pulse (bpm): 70 Weight (lbs): 132 Respiratory Rate (breaths/min): 18 Body Mass Index (BMI): 20.1 Blood Pressure (mmHg): 143/44 Reference Range: 80 - 120 mg / dl Electronic Signature(s) Signed: 02/15/2020 6:19:44 PM By: Deon Pilling Entered By: Deon Pilling on 02/15/2020 88:91:69

## 2020-02-16 ENCOUNTER — Ambulatory Visit (INDEPENDENT_AMBULATORY_CARE_PROVIDER_SITE_OTHER): Payer: Medicare Other | Admitting: Internal Medicine

## 2020-02-16 ENCOUNTER — Encounter: Payer: Self-pay | Admitting: Internal Medicine

## 2020-02-16 VITALS — BP 120/80 | HR 84 | Temp 97.9°F | Ht 67.5 in | Wt 130.0 lb

## 2020-02-16 DIAGNOSIS — I739 Peripheral vascular disease, unspecified: Secondary | ICD-10-CM | POA: Diagnosis not present

## 2020-02-16 DIAGNOSIS — S32592D Other specified fracture of left pubis, subsequent encounter for fracture with routine healing: Secondary | ICD-10-CM | POA: Diagnosis not present

## 2020-02-16 DIAGNOSIS — S3282XD Multiple fractures of pelvis without disruption of pelvic ring, subsequent encounter for fracture with routine healing: Secondary | ICD-10-CM

## 2020-02-16 DIAGNOSIS — R790 Abnormal level of blood mineral: Secondary | ICD-10-CM | POA: Diagnosis not present

## 2020-02-16 DIAGNOSIS — S62102D Fracture of unspecified carpal bone, left wrist, subsequent encounter for fracture with routine healing: Secondary | ICD-10-CM | POA: Diagnosis not present

## 2020-02-16 DIAGNOSIS — G629 Polyneuropathy, unspecified: Secondary | ICD-10-CM | POA: Diagnosis not present

## 2020-02-16 DIAGNOSIS — S52502D Unspecified fracture of the lower end of left radius, subsequent encounter for closed fracture with routine healing: Secondary | ICD-10-CM

## 2020-02-16 DIAGNOSIS — M349 Systemic sclerosis, unspecified: Secondary | ICD-10-CM | POA: Diagnosis not present

## 2020-02-16 DIAGNOSIS — S52592D Other fractures of lower end of left radius, subsequent encounter for closed fracture with routine healing: Secondary | ICD-10-CM | POA: Diagnosis not present

## 2020-02-16 DIAGNOSIS — D508 Other iron deficiency anemias: Secondary | ICD-10-CM

## 2020-02-17 DIAGNOSIS — S52592D Other fractures of lower end of left radius, subsequent encounter for closed fracture with routine healing: Secondary | ICD-10-CM | POA: Diagnosis not present

## 2020-02-17 DIAGNOSIS — I739 Peripheral vascular disease, unspecified: Secondary | ICD-10-CM | POA: Diagnosis not present

## 2020-02-17 DIAGNOSIS — M349 Systemic sclerosis, unspecified: Secondary | ICD-10-CM | POA: Diagnosis not present

## 2020-02-17 DIAGNOSIS — G629 Polyneuropathy, unspecified: Secondary | ICD-10-CM | POA: Diagnosis not present

## 2020-02-17 DIAGNOSIS — S32592D Other specified fracture of left pubis, subsequent encounter for fracture with routine healing: Secondary | ICD-10-CM | POA: Diagnosis not present

## 2020-02-17 DIAGNOSIS — S62102D Fracture of unspecified carpal bone, left wrist, subsequent encounter for fracture with routine healing: Secondary | ICD-10-CM | POA: Diagnosis not present

## 2020-02-24 DIAGNOSIS — M349 Systemic sclerosis, unspecified: Secondary | ICD-10-CM | POA: Diagnosis not present

## 2020-02-24 DIAGNOSIS — I739 Peripheral vascular disease, unspecified: Secondary | ICD-10-CM | POA: Diagnosis not present

## 2020-02-24 DIAGNOSIS — G629 Polyneuropathy, unspecified: Secondary | ICD-10-CM | POA: Diagnosis not present

## 2020-02-24 DIAGNOSIS — S52592D Other fractures of lower end of left radius, subsequent encounter for closed fracture with routine healing: Secondary | ICD-10-CM | POA: Diagnosis not present

## 2020-02-24 DIAGNOSIS — S32592D Other specified fracture of left pubis, subsequent encounter for fracture with routine healing: Secondary | ICD-10-CM | POA: Diagnosis not present

## 2020-02-24 DIAGNOSIS — S62102D Fracture of unspecified carpal bone, left wrist, subsequent encounter for fracture with routine healing: Secondary | ICD-10-CM | POA: Diagnosis not present

## 2020-02-25 ENCOUNTER — Telehealth: Payer: Self-pay | Admitting: Internal Medicine

## 2020-02-25 ENCOUNTER — Other Ambulatory Visit (HOSPITAL_COMMUNITY): Payer: Self-pay | Admitting: Internal Medicine

## 2020-02-25 NOTE — Telephone Encounter (Signed)
[name] [name] [phone]  [name] [name] called to say that her Physical Therapist recommended that maybe she take an antiinflammatory medication since she has fallen. She is wandering if she could take on if it would help or interfere with other medications, or should she check with Ortho.

## 2020-02-25 NOTE — Telephone Encounter (Signed)
I do not think this is a good idea with her complex medical issues. She looked like she was progressing well. No change in medication regimen

## 2020-02-25 NOTE — Telephone Encounter (Signed)
Called and let Gypsy know what Dr Renold Genta said, she verbalized understanding.

## 2020-02-26 ENCOUNTER — Ambulatory Visit: Payer: Medicare Other

## 2020-02-26 NOTE — Patient Instructions (Addendum)
It was a pleasure to see you today.  Need magnesium supplement that you can tolerate.  Continue home health services.  Follow-up in 4 weeks.    Urine culture sent.

## 2020-02-27 DIAGNOSIS — I2721 Secondary pulmonary arterial hypertension: Secondary | ICD-10-CM | POA: Diagnosis not present

## 2020-02-27 DIAGNOSIS — S52592D Other fractures of lower end of left radius, subsequent encounter for closed fracture with routine healing: Secondary | ICD-10-CM | POA: Diagnosis not present

## 2020-02-27 DIAGNOSIS — J9611 Chronic respiratory failure with hypoxia: Secondary | ICD-10-CM | POA: Diagnosis not present

## 2020-02-27 DIAGNOSIS — D62 Acute posthemorrhagic anemia: Secondary | ICD-10-CM | POA: Diagnosis not present

## 2020-02-27 DIAGNOSIS — M349 Systemic sclerosis, unspecified: Secondary | ICD-10-CM | POA: Diagnosis not present

## 2020-02-27 DIAGNOSIS — I672 Cerebral atherosclerosis: Secondary | ICD-10-CM | POA: Diagnosis not present

## 2020-02-27 DIAGNOSIS — S62102D Fracture of unspecified carpal bone, left wrist, subsequent encounter for fracture with routine healing: Secondary | ICD-10-CM | POA: Diagnosis not present

## 2020-02-27 DIAGNOSIS — G629 Polyneuropathy, unspecified: Secondary | ICD-10-CM | POA: Diagnosis not present

## 2020-02-27 DIAGNOSIS — I77819 Aortic ectasia, unspecified site: Secondary | ICD-10-CM | POA: Diagnosis not present

## 2020-02-27 DIAGNOSIS — I6523 Occlusion and stenosis of bilateral carotid arteries: Secondary | ICD-10-CM | POA: Diagnosis not present

## 2020-02-27 DIAGNOSIS — E785 Hyperlipidemia, unspecified: Secondary | ICD-10-CM | POA: Diagnosis not present

## 2020-02-27 DIAGNOSIS — I129 Hypertensive chronic kidney disease with stage 1 through stage 4 chronic kidney disease, or unspecified chronic kidney disease: Secondary | ICD-10-CM | POA: Diagnosis not present

## 2020-02-27 DIAGNOSIS — K8020 Calculus of gallbladder without cholecystitis without obstruction: Secondary | ICD-10-CM | POA: Diagnosis not present

## 2020-02-27 DIAGNOSIS — F329 Major depressive disorder, single episode, unspecified: Secondary | ICD-10-CM | POA: Diagnosis not present

## 2020-02-27 DIAGNOSIS — I739 Peripheral vascular disease, unspecified: Secondary | ICD-10-CM | POA: Diagnosis not present

## 2020-02-27 DIAGNOSIS — M810 Age-related osteoporosis without current pathological fracture: Secondary | ICD-10-CM | POA: Diagnosis not present

## 2020-02-27 DIAGNOSIS — M419 Scoliosis, unspecified: Secondary | ICD-10-CM | POA: Diagnosis not present

## 2020-02-27 DIAGNOSIS — I87321 Chronic venous hypertension (idiopathic) with inflammation of right lower extremity: Secondary | ICD-10-CM | POA: Diagnosis not present

## 2020-02-27 DIAGNOSIS — I2510 Atherosclerotic heart disease of native coronary artery without angina pectoris: Secondary | ICD-10-CM | POA: Diagnosis not present

## 2020-02-27 DIAGNOSIS — I700 Atherosclerosis of aorta: Secondary | ICD-10-CM | POA: Diagnosis not present

## 2020-02-27 DIAGNOSIS — I8312 Varicose veins of left lower extremity with inflammation: Secondary | ICD-10-CM | POA: Diagnosis not present

## 2020-02-27 DIAGNOSIS — N1330 Unspecified hydronephrosis: Secondary | ICD-10-CM | POA: Diagnosis not present

## 2020-02-27 DIAGNOSIS — S32592D Other specified fracture of left pubis, subsequent encounter for fracture with routine healing: Secondary | ICD-10-CM | POA: Diagnosis not present

## 2020-02-27 DIAGNOSIS — E039 Hypothyroidism, unspecified: Secondary | ICD-10-CM | POA: Diagnosis not present

## 2020-02-27 DIAGNOSIS — D631 Anemia in chronic kidney disease: Secondary | ICD-10-CM | POA: Diagnosis not present

## 2020-02-28 NOTE — Patient Instructions (Signed)
It was a pleasure to see you today.  We are pleased to see you are progressing well.  Continue iron and magnesium supplements.  Follow-up in July.

## 2020-02-28 NOTE — Progress Notes (Signed)
   Subjective:    [name], female    DOB: 1951-06-23, 69 y.o.   MRN: [id number]  HPI 69 year old Female here with her husband to follow-up on multiple medical issues.  Has had issues with hypomagnesemia recently.  Has finally found a supplement for natural alternatives and magnesium level has improved.  It was 1.5 in January and is now normal at 1.8 which is excellent.  At one point it was as low as 0.8 in November 2020.  She is status post a fall with wrist and pelvic fracture.  Her iron level was checked today and is increased from 50-70 with iron supplementation.  BUN is stable.  Creatinine has improved from 1.30 in January to 1.13.  She looks much better in terms of strength and less pain.  Her husband is very supportive.  I am pleased with her progress.  She receives home physical therapy.  Review of Systems see above no new complaints.  Continues to see Dr. Dellia Nims for wound care on her legs. Has had one COVID-19 injection.  Sees rheumatologist and had bone density study in 2018 with lowest T score -2.9.  Will need follow-up on bone density in a few months.    Objective:   Physical Exam Neck supple.  Chest clear.  Cardiac exam regular rate and rhythm.  Vital signs reviewed.  Her affect is much brighter       Assessment & Plan:  Hypomagnesemia-improved with supplement  Iron deficiency improved-with iron supplement  Osteoporosis-needs follow-up with bone density study.  This may be done by rheumatology.  Plan: Continue current medications and follow-up in July for 63-monthrecheck.

## 2020-02-29 ENCOUNTER — Encounter (HOSPITAL_BASED_OUTPATIENT_CLINIC_OR_DEPARTMENT_OTHER): Payer: Medicare Other | Attending: Internal Medicine | Admitting: Internal Medicine

## 2020-02-29 ENCOUNTER — Other Ambulatory Visit: Payer: Self-pay

## 2020-02-29 DIAGNOSIS — L89616 Pressure-induced deep tissue damage of right heel: Secondary | ICD-10-CM | POA: Diagnosis not present

## 2020-02-29 DIAGNOSIS — Z880 Allergy status to penicillin: Secondary | ICD-10-CM | POA: Diagnosis not present

## 2020-02-29 DIAGNOSIS — L97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: Secondary | ICD-10-CM | POA: Insufficient documentation

## 2020-02-29 DIAGNOSIS — Z9981 Dependence on supplemental oxygen: Secondary | ICD-10-CM | POA: Diagnosis not present

## 2020-02-29 DIAGNOSIS — I10 Essential (primary) hypertension: Secondary | ICD-10-CM | POA: Insufficient documentation

## 2020-02-29 DIAGNOSIS — I2721 Secondary pulmonary arterial hypertension: Secondary | ICD-10-CM | POA: Insufficient documentation

## 2020-02-29 DIAGNOSIS — I83222 Varicose veins of left lower extremity with both ulcer of calf and inflammation: Secondary | ICD-10-CM | POA: Insufficient documentation

## 2020-02-29 DIAGNOSIS — L97215 Non-pressure chronic ulcer of right calf with muscle involvement without evidence of necrosis: Secondary | ICD-10-CM | POA: Diagnosis not present

## 2020-02-29 DIAGNOSIS — M1990 Unspecified osteoarthritis, unspecified site: Secondary | ICD-10-CM | POA: Diagnosis not present

## 2020-02-29 DIAGNOSIS — Z08 Encounter for follow-up examination after completed treatment for malignant neoplasm: Secondary | ICD-10-CM | POA: Diagnosis not present

## 2020-02-29 DIAGNOSIS — Z881 Allergy status to other antibiotic agents status: Secondary | ICD-10-CM | POA: Diagnosis not present

## 2020-02-29 DIAGNOSIS — L97319 Non-pressure chronic ulcer of right ankle with unspecified severity: Secondary | ICD-10-CM | POA: Diagnosis not present

## 2020-02-29 DIAGNOSIS — Z882 Allergy status to sulfonamides status: Secondary | ICD-10-CM | POA: Insufficient documentation

## 2020-02-29 DIAGNOSIS — Z85828 Personal history of other malignant neoplasm of skin: Secondary | ICD-10-CM | POA: Diagnosis not present

## 2020-02-29 DIAGNOSIS — M341 CR(E)ST syndrome: Secondary | ICD-10-CM | POA: Insufficient documentation

## 2020-02-29 DIAGNOSIS — M793 Panniculitis, unspecified: Secondary | ICD-10-CM | POA: Insufficient documentation

## 2020-02-29 DIAGNOSIS — J9610 Chronic respiratory failure, unspecified whether with hypoxia or hypercapnia: Secondary | ICD-10-CM | POA: Diagnosis not present

## 2020-02-29 DIAGNOSIS — L89619 Pressure ulcer of right heel, unspecified stage: Secondary | ICD-10-CM | POA: Diagnosis not present

## 2020-02-29 DIAGNOSIS — L97811 Non-pressure chronic ulcer of other part of right lower leg limited to breakdown of skin: Secondary | ICD-10-CM | POA: Diagnosis not present

## 2020-02-29 DIAGNOSIS — Z7902 Long term (current) use of antithrombotics/antiplatelets: Secondary | ICD-10-CM | POA: Diagnosis not present

## 2020-02-29 DIAGNOSIS — L820 Inflamed seborrheic keratosis: Secondary | ICD-10-CM | POA: Diagnosis not present

## 2020-02-29 NOTE — Progress Notes (Signed)
[name] [name], [name] [name] ([id number]) Visit Report for 02/29/2020 Allergy List Details Patient Name: Date of Service: [name] [name], [name] [name] 02/29/2020 11:30 AM Medical Record Number:[id number] Patient Account Number: [id number] Date of Birth/Sex: Treating RN: August 13, 1951 (69 y.o. [name] Primary Care Demitris Pokorny: [name] Other Clinician: Referring Charlye Spare: Treating Duwane Gewirtz/Extender:Robson, Leotis Shames, MARY Weeks in Treatment: 414 Allergies Active Allergies Sulfa (Sulfonamide Antibiotics) Reaction: rash Severity: Severe Levaquin Reaction: flu like symptoms Severity: Severe penicillin Reaction: rash Severity: Severe Allergy Notes Electronic Signature(s) Signed: 02/29/2020 5:45:55 PM By: Linton Ham MD Entered By: Linton Ham on 02/29/2020 12:52:01 -------------------------------------------------------------------------------- Wound Assessment Details Patient Name: Date of Service: [name] [name]. 02/29/2020 11:30 AM Medical Record Number:[id number] Patient Account Number: [id number] Date of Birth/Sex: Treating RN: 1951-08-19 (69 y.o. [name] Primary Care Melvena Vink: [name] Other Clinician: Referring Raechel Marcos: Treating Cord Wilczynski/Extender:Robson, Leotis Shames, MARY Weeks in Treatment: 414 Wound Status Wound Number: 16 Primary Pressure Ulcer Etiology: Wound Location: Right Calcaneus Wound Open Wounding Event: Pressure Injury Wounding Event: Pressure Injury Status: Date Acquired: 01/04/2020 Comorbid Anemia, Hypertension, Peripheral Arterial Weeks Of Treatment: 8 History: Disease, Peripheral Venous Disease, Clustered Wound: No Raynauds, Scleroderma, Rheumatoid Arthritis, Osteoarthritis Wound Measurements Length: (cm) 1.5 Width: (cm) 2.4 Depth: (cm) 0.1 Area: (cm) 2.827 Volume: (cm) 0.283 Wound Description Classification: Unstageable/Unclassified Wound Margin: Flat and Intact Exudate Amount: Medium Exudate Type: Serosanguineous Exudate  Color: red, brown Wound Bed Granulation Amount: None Present (0%) Necrotic Amount: Large (67-100%) Necrotic Quality: Eschar r After Cleansing: No ibrino No Exposed Structure ed: No ubcutaneous Tissue) Exposed: No ed: No ed: No d: No : No % Reduction in Area: 77.5% % Reduction in Volume: 77.5% Epithelialization: None Tunneling: No Undermining: No Foul Odo Slough/F Fascia Expos Fat Layer (S Tendon Expos Muscle Expos Joint Expose Bone Exposed Electronic Signature(s) Signed: 02/29/2020 5:57:14 PM By: Levan Hurst RN, BSN Entered By: Levan Hurst on 02/29/2020 12:42:24 -------------------------------------------------------------------------------- Wound Assessment Details Patient Name: Date of Service: [name] [name] 02/29/2020 11:30 AM Medical Record Number:[id number] Patient Account Number: [id number] Date of Birth/Sex: Treating RN: Jun 22, 1951 (69 y.o. 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 - Medial Wound Open Wounding Event: Gradually Appeared Status: Date Acquired: 01/13/2013 Comorbid Anemia, Hypertension, Peripheral Arterial Weeks Of Treatment: 370 History: Disease, Peripheral Venous Disease, Clustered Wound: Yes Raynauds, Scleroderma, Rheumatoid Arthritis, Osteoarthritis Wound Measurements Length: (cm) 1 % Reduction Width: (cm) 1.4 % Reduction Depth: (cm) 0.5 Epitheliali Area: (cm) 1.1 Tunneling: Volume: (cm) 0.55 Underminin Wound Description Full Thickness With Exposed Support Foul Odo Classification: Structures Slough/F Wound Well defined, not attached Margin: Exudate Medium Amount: Exudate Type: Serosanguineous Exudate red, brown Color: Wound Bed Granulation Amount: Small (1-33%) Granulation Quality: Pink,  Pale Fascia Expo Necrotic Amount: Large (67-100%) Fat Layer ( Necrotic Quality: Adherent Slough Tendon Expo Muscle Expo Necros Joint Expos Bone Expose r After Cleansing: No ibrino Yes Exposed Structure sed: No Subcutaneous Tissue) Exposed: Yes sed: No sed: Yes is of Muscle: No ed: No d: No in Area: 22.2% in Volume: -290.1% zation: Small (1-33%) No g: No Electronic Signature(s) Signed: 02/29/2020 5:57:14 PM By: Levan Hurst RN, BSN Entered By: Levan Hurst on 02/29/2020 12:41:04 -------------------------------------------------------------------------------- Wound Assessment Details Patient Name: Date of Service: [name] [name] 02/29/2020 11:30 AM Medical Record Number:[id number] Patient Account Number: [id number] Date of Birth/Sex: Treating RN: 1951-11-18 (69 y.o. [name] Primary Care Legacy Lacivita: Renold Genta,  MARY Other Clinician: Referring Ramiz Turpin: Treating Domenic Schoenberger/Extender:Robson, Leotis Shames, MARY Weeks in Treatment: 414 Wound Status Wound Number: 7 Primary Venous Leg Ulcer Etiology: Wound Location: Right, Lateral Malleolus Wound Open Wounding Event: Gradually Appeared Status: Date Acquired: 06/25/2016 Comorbid Anemia, Hypertension, Peripheral Arterial Weeks Of Treatment: 190 History: Disease, Peripheral Venous Disease, Clustered Wound: No Raynauds, Scleroderma, Rheumatoid Arthritis, Osteoarthritis Wound Measurements Length: (cm) 0.1 % Reduct Width: (cm) 0.1 % Reduct Depth: (cm) 0.1 Epitheli Area: (cm) 0.008 Tunneli Volume: (cm) 0.001 Undermi Wound Description Full Thickness Without Exposed Support Foul Od Classification: Structures Slough/ Wound Well defined, not attached Margin: Exudate Small Amount: Exudate Serosanguineous Type: Exudate red, brown Color: Wound Bed Granulation Amount: Large (67-100%) Granulation Quality: Pink, Pale Fascia E Necrotic Amount: None Present (0%) Fat Laye Tendon E Muscle E Joint Ex Bone Exp or  After Cleansing: No Fibrino No Exposed Structure xposed: No r (Subcutaneous Tissue) Exposed: No xposed: No xposed: No posed: No osed: No ion in Area: 99.7% ion in Volume: 99.6% alization: Large (67-100%) ng: No ning: No Electronic Signature(s) Signed: 02/29/2020 5:57:14 PM By: Levan Hurst RN, BSN Entered By: Levan Hurst on 02/29/2020 12:42:46 -------------------------------------------------------------------------------- Vitals Details Patient Name: Date of Service: [name] [name]. 02/29/2020 11:30 AM Medical Record Number:[id number] Patient Account Number: [id number] Date of Birth/Sex: Treating RN: 18-Jun-1951 (69 y.o. [name] Primary Care Heaton Sarin: [name] Other Clinician: Referring Marvene Strohm: Treating Lezlie Ritchey/Extender:Robson, Leotis Shames, MARY Weeks in Treatment: 414 Vital Signs Time Taken: 12:05 Temperature (F): 97.9 Height (in): 68 Pulse (bpm): 67 Weight (lbs): 132 Blood Pressure (mmHg): 132/62 Body Mass Index (BMI): 20.1 Reference Range: 80 - 120 mg / dl Notes vitals obtained by patient Electronic Signature(s) Signed: 02/29/2020 5:57:14 PM By: Levan Hurst RN, BSN Entered By: Levan Hurst on 02/29/2020 12:38:46

## 2020-02-29 NOTE — Progress Notes (Signed)
MULKI, ROESLER ([id number]) Visit Report for 02/29/2020 HPI Details Patient Name: Date of Service: [name], [name] 02/29/2020 11:30 AM Medical Record Number:[id number] Patient Account Number: [id number] Date of Birth/Sex: Treating RN: 1951/08/11 (69 y.o. [name] Primary Care Provider: [name] Other Clinician: Referring Provider: Treating Provider/Extender:Walker Paddack, Leotis Shames, MARY Weeks in Treatment: 414 History of Present Illness HPI Description: this is a patient who initially came to [place] for wounds on the medial malleoli bilaterally as well as her upper medial lower extremities bilaterally. These wounds eventually healed with assistance of Apligraf's bilaterally. While this was occurring she developed the current wound which opened into a fairly substantial wound on the right lower extremity. These are mostly secondary to venous stasis physiology however the patient also has underlying scleroderma, pulmonary hypertension. The wound has been making good progress lately with the Hydrofera Blue-based dressing. 03/17/2015; patient had a arterial evaluation a year ago. Her right ABI was 0.86 left was 1.0. Toe brachial index was 0.41 on the right 0.45 on the left. Her bilateral common femoral artery waveforms were triphasic. Her white popliteal posterior tibial artery and anterior tibial artery waveforms were monophasic with good amplitude. Luteal artery waveforms were biphasic it was felt that her bilateral great toe pressures are of normal although adequate for tissue healing. 03/24/2015. The condition of this wound is not really improved that. He was covered with as fibrinous surface slough and eschar. This underwent an aggressive debridement with both a curette and scalpel. I still cannot really get down to what I can would consider to be a viable surface. There is no evidence of infection. Previous workup for ischemia roughly a year ago was negative nevertheless I think that  continues to be a concern 04/07/15. The patient arrived for application of second Apligraf. Once again the surface of this wound is certainly less viable than I would like for an advanced treatment option. An aggressive debridement was done. She developed some arteriolar bleeding which required that along pressure and silver alginate. 04/21/15: change in the condition of this wound. Once again it is covered in a gelatinous surface slough. After debridement today surface of the wound looks somewhat better but now a heeling surface 05/05/15 Apligraf #4 applied.Still a lot of slough on this wound. 05/19/15 Apligraf #5 applied. Still a lot of slough on this wound I did not aggressively remove this 06/02/15; continued copious amounts of surface slough. This debridement fairly easily. 06/08/2015 -- the last time she had a venous duplex study done was over 3 years ago and the surgery was prior to that. I have recommended that she sees Dr. early for a another opinion regarding a repeat venous duplex and possibly more endovenous ablation of vein stripping of micro-phlebotomies. 06/16/15; wound has a gelatinous surface eschar that the debridement fairly easily to a point. I don't disagree with the venous workup and perhaps even arterial re-evaluation. She is on prednisone 5 mg and continue his medications for her pulmonary artery hypertension I am not sure if the latter have any wound care healing issues I would need to investigate this. 06/23/15 continues with a gelatinous surface eschar with of fibrinous underlying. What I can see of this wound does not look unhealthy however I just can't get this material which I think is 2 different layers off. Empiric culture done 06/30/15; unfortunately not a lot of change in this wound. A gelatinous surface eschar is easily removed however it has a tight fibrinous surface underneath the. 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 The wound comes back and  basically and unchanged state the. She has a gelatinous surface that is more easily removed however there is a tightly fibrinous surface underneath the. There is no evidence of infection. She has a vascular follow-up next month. I would have to inject her in order to do a more aggressive debridement of this area 07/21/15 the wound is roughly in the same state albeit the debridement is done with greater ease. There is less of the fibrinous eschar underneath the. There is no evidence of infection. She has follow-up with vascular surgery next week. No evidence of surrounding infection. Her original distal wounds healed while this one formed. 07/28/15; wound is easier to debride. No wound erythema. She is seeing Dr. [name] next week. 08/18/15 Has been to [place] for repeat ablation. Have been using medihoney pad with some improvement 08/25/15; absolutely no change in the condition of this wound in either its overall size or surface condition in many months now. At one point I had this down to [place] healthier surface I think with [facility] however this did not actually progress towards closure. Do not believe that the wound has actually deteriorated in terms of volume at all. We have been using a medihoney pad which allows easier debridement of the gelatinous eschar but again no overall actual improvement. the patient is going towards an ablation with pain and vascular which I think is scheduled for next week. The only other investigation that I could first see would be a biopsy. She does have underlying scleroderma 09/02/15 eschar is much easier to debridement however the base of this does not look particularly vibrant. We changed to Iodoflex. The patient had her ablation earlier this week 09/09/15 again the debridement over the base of this wound is easier and the base of the wound looks considerably better. We will continue the Iodoflex. Dr. Tawni Millers has expressed his satisfaction with the result of  her ablation 09/15/15 once again the wound is relatively free of surface eschar. No debridement was done today. It has a pale- looking base to it. although this is not as deep as it once was it seems to be expanding especially inferiorly. She has had recent venous ablation but this is no closer to healing.I've gone ahead and done a punch biopsy this from the inferior part of the wound close to normal skin 09/22/15: the wound is relatively free of surface eschar. There is some surrounding eschar. I'm not exactly sure at what level the surface is that I am seeing. 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 No evidence of atypical infection, malignancy. 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 No debridement was done 09/29/15; the wound surface is relatively free of surface eschar. There is eschar surrounding the walls of the wound. Aside from the improvement in the amount of surface slough. This wound has not progressed any towards closure. There is not even a surface that looks like there at this is ready. There is no evidence of any infection nor maligancy based on biopsy I did on 9/15. I continued with the Iodoflex however I am looking towards some alternative to try to promote some closure or filling in of this surface. Consider triple layer [place]. Collagen did not result in adequate control of the surface slough 10/13/15; the patient was in hospital last week with severe anemia. The wound looks somewhat better after debridement although there is widening  medially. There is no evidence of infection. 10/20/15; patient's wound on the right lateral lower leg is essentially unchanged. This underwent a light surface debridement and in general the debridement is easier and the surface looks improved. I noted in doing this on the side of the wound what appears to be a piece of calcium deposition. The patient noted that she had previous things on  the tips of her fingers. 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 She follows with rheumatology and I have asked them to talk to her about this. In view of that S the nonhealing ulcer may have something to do with calcinosis and also unrelieved Raynaud's disease in this area. I should note that her original wounds on the right and left medial malleolus and the inner aspects of both legs just below the knees did however heal over 10/30/15; the patient's wound on the right lateral lower leg is essentially unchanged. I was able to remove some calcified material from the medial wound edge. 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 Otherwise the wound appears essentially unchanged there is less adherent surface eschar. Some of the calcified material was sent to pathology for analysis 11/17/15. The patient's wound on the right lateral leg is essentially unchanged. Wider Medially. 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 There is some surface slough but I did not debridement this. No evidence of surrounding infection. 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 This will make healing difficult 12/15/15; the wound has less depth surface has a fibrinous slough and calcifications in the wound edge no evidence of infection 12/22/15; the wound definitely has less Fibrinous slough on the base. Calcifications around the wound edges are still evident. Although the wound bed looks healthier it is still pale in appearance. Previous biopsy did not show malignancy 01/04/15; surgical debridement of nonviable  slough and subcutaneous tissue the wound cleans up quite nicely but appears to be expanding outward calcifications around the wound edges are still there. 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 She is to see her rheumatologist I'll see if he has any opinions on this. My punch biopsy done in September did not show calcifications although these are clearly evident. 01/19/16 light selective debridement of nonviable surface slough. There is epithelialization medially. This gives me reason for cautious optimism. She has been to see her rheumatologist, there is nothing that can be done for this type of soft tissue calcification associated with scleroderma 02/02/16 no debridement although there is a light surface slough. She has 2 peninsulas of skin 1 inferiorly and one medially. We continue to make a slight and slow but definite progressive here 02/16/16; light surface debridement with more attention to the circumference of the wound bed where the fibrinous eschar is more prevalent. No calcifications detected. She seems to have done nicely with the [facility] with some epithelialization and some improvement in the overall wound volume. 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 Continued attention to the circumference of the wound where the fibrinous  eschar in calcinosis or prevalent. No calcifications were detected. She has continued improvement with Hydrofera Blue. The wound is no longer as deep 03/15/16 surgical debridement done to remove surface escha Especially around the circumference of the wound where there is nonviable subcutaneous tissue. In spite of this there is considerable improvement in the overall dimensions and depth of the wound. Islands of epithelialization are seen especially medially inferiorly and superiorly to a  lesser extent. She is using Hydrofera Blue at home 03/29/16; surgical debridement done to remove surface eschar and nonviable subcutaneous tissue. This cleans up quite nicely mention slightly larger in terms of length and width however depth is less 04/12/16; continued gradual improvement in terms of depth and the condition of the wound base. Debridement is done. Continuing long standing Hydrofera Blue at home with Kerlix Coban wraps 04/26/16; continued gradual improvement in terms of depth and management as well as condition of the wound base. Surgical debridement done she continues with Hydrofera Blue. 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 She initially came to this clinic venous insufficiency ulcers which have long since healed 05/17/16 continued improvement in terms of the depth and measurements of this wound although she has a tightly adherent fibrinous slough each time. We've been continued with long standing Hydrofera Blue which seems to done as well for this wound is many advanced treatment options. 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 She also has chronic venous insufficiency. She has an irritation on her lateral right ankle secondary to our wraps 05/10/16; wound appears to be smaller especially on the medial aspect and especially in the width. Wound was debridement surface looks better. She is also been in the hospital apparently with anemia again she tells me she had an endoscopy. Since she got home after 3 days which I believe was sometime last week she has had an irritated painful area on the right lateral ankle surrounding the lateral malleolus 05/31/16; much more adherent surface slough today then recently although I don't think the dimensions of changed that much. A more aggressive debridement is required. The irritated area over her medial malleolus is more pruritic and painful and I don't think  represents cellulitis 06/14/16; no major change in her wound dimensions however there is more tightly adherent surface slough which is disappointing. As well as she appears to have a new small area medially. Furthermore an irritated uncomfortable area on the lateral aspect of her right foot just below the lateral malleolus. 06/21/16; I'm seeing the patient back in follow-up for the new areas under her major wound on the right anterior leg. She has been using Hydrofera Blue to this area probably for several months now and although the dressings seem to be helping for quite a period of time I think things have stagnated lately. She comes in today with a relatively tight adherent surface slough and really no changes in the wound shape or dimension. The 2 small areas she had inferiorly are tiny but still open they seem improved this well. There is no uncontrolled edema and I don't think there is any evidence of cellulitis. 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 Patient has had vascular evaluations both venous and arterial. I have biopsied this area. There is no obvious infection. The worrisome thing today is that she seems to be developing areas of  erythema and epithelial damage on the medial aspect of the right foot. Also to a lesser degree inferior to the actual wound itself. Again I see no obvious changes to suggest cellulitis however as this is the only treatable option I will probably give her antibiotics. 07/13/16 no major change in the lady's large anterior right leg wound. Still covered with a very tightly adherent surface slough which is difficult to debridement. There is less erythema around this, culture last week grew pseudomonas I gave her ciprofloxacin. The area on her lateral right malleolus looks better- 08/02/16 the patient's wounds continued to decline. Her original large  anterior right leg wound looks deeper. Still adherent surface slough that is difficult to debridement. She has a small area just below this and a punched-out wound just below her lateral malleolus. In the meantime she is been in hospital with apparently an upper GI bleed on Plavix and aspirin. She is now just on Plavix she received 3 units of packed cells 08/23/16; since I last saw this 3 weeks ago, the open large area on her right leg looks about the same syrup. She has a small satellite lesion just underneath this. The area on her medial right ankle is now a deep necrotic wound. I attempted to debridement this however there is just too much pain. 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 She follows with vascular surgery and is scheduled for an angiogram in early September 09/06/16; the patient is going for an arteriogram tomorrow. Her original large wound on the right calf is about the same the satellite lesion underneath it is about the same however the area on her medial ankle is now deeper with exposed tendon. I am no longer attempting to debride these wounds 09/20/16; the patient has undergone a right femoral endarterectomy and Dacron patch angioplasty. This seems to have helped the flow in her right leg. 10/04/16; Arrived today for aggressive debridement of the wounds on her right calf the original wound the one beneath it and a difficult area over her right lateral leg just above the lateral malleolus. 10/25/16; her 3 open wounds are about the same in terms of dimensions however the surface appears a lot healthier post debridement. Using Iodoflex 10/18/16 we have been using Iodoflex to her wounds which she tolerated with some difficulty. 10/11/16; has been using Santyl for a period of time with some improvement although again very adherent surface slough would prevent any attempted healing this. She has a original wound  on the left calf, the satellite underneath that and the most recent wound on the right medial ankle. She has completed revascularization by Dr. early and has had venous ablation earlier. Want to go back to Iodoflex to see if week and get a healthier surface to this wound bed failing this I think she'll need to be taken to the OR and I am prepared to call Dr. Marla Roe to discuss this. She is obviously not a good candidate for general anesthesia however.; 11/08/16; I put her on Iodoflex last time to see if I can get the wound bed any healthier and unfortunately today still had tremendous surface slough. 11/15/16; 4 weeks' worth of Iodoflex with not much improvement. Debridement on the major wounds on her left anterior leg is easier however this does not maintain from week to week. The punched-out area on her medial right ankle 11/29/16; I attempted to change the patient last visit to  Hydrofera Blue however she states this burned and was very uncomfortable therefore we gave her permission to go back to the eye out of complex which she already had at home. Also she noted a lot of pain and swelling on the lateral aspect of her leg before she traveled to [facility] for the holidays, I called her in doxycycline over the phone. This seems to have helped 12/06/16; Wounds unchanged by in large. Using Iodoflex 12/13/16; her wounds today actually looks somewhat better. The area on the right lateral lower leg has reasonably healthy-looking granulation and perhaps as actually filled and a bit. Debridement of the 2 wounds on the medial calf is easier and post debridement appears to have a healthier base. We have referred her to Dr. Migdalia Dk for consideration of operative debridement 12/20/16; we have a quick note from Dr. Merri Ray who feels that the patient needs to be referred to an academic center/plastic surgeon. This is due to the complexity of the patient's medical issues as it  applies to anything in the OR. We have been using Iodoflex 12/27/16; in general the wounds on the right leg are better in terms of the difficult to remove surface slough. She has been using Iodoflex. She is approved for Apligraf which I anticipated ordering in the next week or 2 when we get a better-looking surface 01/04/16 the deep wounds on the right leg generally look better. Both of them are debrided further surface slough. The area on the lateral right leg was not debrided. She is approved for Apligraf I think I'll probably order this either next week or the week after depending on the surface of the wounds superiorly. We have been using Iodoflex which will continue until then 01/11/16; the deep wounds on the right leg again have a surface slough that requires debridement. I've not been able to get the wound bed on either one of these wounds down to what would be acceptable for an advanced skin stab- like Apligraf. The area on the medial leg has been improving. We have been using hot Iodoflex to all wounds which seems to do the best at at least limiting the nonviable surface 01/24/17; we have continued Iodoflex and all her wound areas. Her debridement Gen. he is easier and the surface underneath this looks viable. Nevertheless these are large area wounds with exposed muscle at least on the anterior parts. We have ordered Apligraf's for 2 weeks from now. The patient will be away next week 02/07/17; the patient was close to have first Apligraf today however we did not order one. I therefore replaced her Iodoflex. She essentially has 3 large punched-out areas on her right anterior leg and right medial ankle. 02/11/17; Apligraf #1 02/25/17; Apligraf #2. In general some improvement in the right medial ankle and right anterior leg wounds. The larger wound medially perhaps some better 03/11/17; Apligraf #3. In general continued improvement in the right medial ankle and the right anterior leg  wounds. 03/25/17 Apligraf #4. In general continued improvement especially on the right medial ankle and the lower 04/08/17; Apligraf #5 in general continued improvement in all wound sites. 04/22/17; post Apligraf #5 her wound beds continued to look a lot better all of them up to the surface of the surrounding skin. Had a caramel-colored slough that I did not debrided in case there is residual Apligraf effect. The wounds are as good as I have seen these looking quite some time. 04/29/17; we applied [place] and last week after completing  her Apligraf. Wounds look as though they've contracted somewhat although they have a nonviable surface which was problematic in the past. Apparently she has been approved for further Apligraf's. We applied Iodosorb today after debridement. 05/06/17; we're fortunate enough today to be able to apply additional Apligraf approved by her insurance. In general all of her wounds look better Apligraf #6 05/24/17; Apligraf #7 continued improvement in all wounds 3 06/10/17 Apligraf #8. Continued improvement in the surface of all wounds. Not much of an improvement in dimensions as I might a follow 06/24/17 Apligraf #9 continued general improvement although not as much change in the wound areas I might of like. She has a new open area on the right anterior lateral ankle very small and superficial. She also has a necrotic wound on the tip of her right index finger probably secondary to severe uncontrolled Raynaud's phenomenon. She is already on sildenafil and already seen her rheumatologist who gave her Keflex. 07/08/17; Apligraf #10. Generalized improvement although she has a small additional wound just medial to the major wound area. 07/22/17; after discussion we decided not to reorder any further Apligraf's although there is been considerable improvement with these it hasn't been recently. The major wound anteriorly looks better. Smaller wounds beneath this and the more recent  one and laterally look about the same. The area on the right lateral lower leg looks about the same 07/29/17 this is a patient who is exceedingly complex. She has advanced scleroderma, crest syndrome including calcinosis, PAD status post revascularization, chronic venous insufficiency status post ablations. She initially presented to this clinic with wounds on her bilateral lower legs however these closed. More recently we have been dealing with a large open area superiorly on the right anterior leg, a smaller wound underneath this and then another one on the right medial lower extremity. These improve significantly with 10 Apligraf applications. Over the last 2-3 weeks we are making good progress with Hydrofera Blue and these seem to be making progress towards closure 08/19/17; wounds continued to make good improvement with Hydrofera Blue and episodic aggressive debridements 08/26/17; still using Hydrofera Blue. Good improvements 09/09/17; using Hydrofera Blue continued improvement. Area on the lateral part of her right leg has only a small remaining open area. The small inferior satellite region is for all intents and purposes closed. Her major wound also is come in in terms of depth and has advancing epithelialization. 09/16/17; using Hydrofera Blue with continued improvement. The smaller satellite wound. We've closed out today along with a new were satellite wound medially. The area on her medial ankle is still open and her major wound is still open but making improvement. All using Hydrofera Blue. Currently 09/30/17; using Hydrofera Blue. Still a small open area on the lateral right ankle area and her original major wound seems to be making gradual and steady improvement. 10/14/17; still using Hydrofera Blue. Still too small open areas on the right lateral ankle. Her original major wound is horizontal and linear. The most problematic area paradoxically seems to be the area to the medial area wears  I thought it would be the lateral. 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 10/28/17; still using Hydrofera Blue. Right lateral ankle has a very small open area superiorly on the most lateral part of the wound. Her original open wound has 2 open areas now separated by normal skin and we've redefined this. 11/11/17; still using Hydrofera Blue area and right lateral ankle continues  to have a small open area on mostly the lateral part of the wound. Her original wound has 2 small open areas now separated by a considerable amount of normal skin 11/28/17; the patient called in slightly before Thanksgiving to report pain and erythema above the wound on the right leg. In the past this is responded well to treatment for cellulitis and I gave her over the phone doxycycline. She stated this resulted in fairly abrupt improvement. We have been using Hydrofera Blue for a prolonged period of time to the larger wound anteriorly into the remaining wound on her right right lateral ankle. The latter is just about closed with only a small linear area and the bottom of the [place]. 12/02/17; use endoform and left the dressing on since last visit. There is no tenderness and no evidence of infection. 12/16/17; patient has been using Endoform but not making much progress. The 2 punched out open areas anteriorly which were the reminiscence of her major wound appear deeper. The area on the lateral aspect of her right calf also appears deeper. Also she has a puzzling tender swelling above her wound on the right leg. This seems larger than last time. Just above her wounds there appears to be some fluctuance in this area it is not erythematous and there is no crepitus 12/30/17; patient has been using Endoform up until last week we used Hydrofera Blue. Ultrasound of the swelling above her 2 major wounds last time was negative for a fluid collection. I gave her cefaclor for the  erythema and tenderness in this area which seems better. Unfortunately both punched out areas anteriorly and the area on her right medial lower leg appear deeper. In fact the lateral of the wounds anteriorly actually looks as though it has exposed tendon and/or muscle sheath. She is not systemically unwell. She is complaining of vaginitis type symptoms presumably Candida from her antibiotics. 01/06/18; we're using santyl. she has 2 punched out areas anteriorly which were initially part of a large wound. Unfortunately medially this is now open to tendon/muscle. All the wounds have the same adherent very difficult to debride surface. 01/20/18; 2 week follow-up using Santyl. She has the 2 punched out areas anteriorly which were initially part of her large surface wound there. Medially this still has exposed muscle. All of these have the same tightly adherent necrotic surface which requires debridement. PuraPly was not accepted by the patient's insurance however her insurance I think it changed therefore we are going to run Apligraf to gain 02/03/18; the patient has been using Santyl. The wound on the right lateral ankle looks improved and the 2 areas anteriorly on the right leg looks about the same. The medial one has exposed muscle. The lateral 1 requiresdebridement. We use PuraPly today for the first week 02/10/18; PuraPly #2. The patient has 3 wounds. The area on the right lateral ankle, 2 areas anteriorly that were part of her original large wound in this area the medial one has exposed muscle. All of the wounds were lightly debrided with a number 3 curet. PuraPly #2 applied the lateral wound on the calf and the right lateral ankle look better. 02/17/18; PuraPly #3. Patient has 3 wounds. The area on the right lateral ankle in 2 areas internally that were part of her original large wound. The lateral area has exposed muscle. She arrived with some complaints of pain around the right ankle. 02/24/18;  PuraPly #4; not much change in any of the 3 wound areas. Right  lateral ankle, right lateral calf. Both of these required debridement with a #3 curet. She tolerates this marginally. The area on the medial leg still has exposed muscle. Not much change in dimensions 03/03/18 PuraPly #5. The area on the medial ankle actually looks better however the 2 separate areas that were original parts of the larger right anterior leg wound look as though they're attempting to coalesce. 03/10/18; PuraPly #6. The area on the medial ankle actually continues to look and measures smaller however the 2 separate areas that were part of the original large wound on the right anterior leg have now coalesced. There hasn't been much improvement here. The lateral area actually has underlying exposed muscle 03/17/18-she is here in follow-up evaluation for ulcerations to the right lower extremity. She is voicing no complaints or concerns. She tolerated debridement. Puraply#7 placed 03/24/18; difficult right lower extremity ulcerations. PuraPly #8 place. She is been approved for [employer]. She did very well with Apligraf today however she is apparently reached her "lifetime max" 03/31/18; marginal improvement with PuraPly although her wounds looked as good as they have in several weeks today. Used TheraSkins #1 04/14/18 TheraSkin #2 today 04/28/18 TheraSkins #3. Wound slightly improved 05/12/18; TheraSkin skin #4. Wound response has been variable. 05/27/18 TheraSkin #5. Generally improvement in all wound areas. I've also put her in 3 layer compression to help with the severe venous hypertension 06/09/18; patient is done quite well with the TheraSkins unfortunately we have no further applications. I also put her in 3 layer compression last week and that really seems to of helped. 06/16/18; we have been using silver collagen. Wounds are smaller. Still be open area to the muscle layer of her calf however even that is contracted  somewhat. She tells me that at night sometimes she has pain on the right lateral calf at the site of her lower wound. Notable that I put her into 3 layer compression about 3 weeks ago. She states that she dangles her leg over the bed that makes it feel better but she does not describe claudication during the day She is going to call her secondary insurance to see if they will continue to cover advanced treatment products I have reviewed her arterial studies from 01/22/17; this showed an ABI in the right of 1 and on the left noncompressible. TBI on the right at 0.30 on the left at 0.34. It is therefore possible she has significant PAD with medial calcification falsely elevating her ABI into the normal range. I'll need to be careful about asking her about this next week it's possible the 3 layer compression is too much 06/23/18; was able to reapply TheraSkin 1 today. Edema control is good and she is not complaining of pain no claudication 07/07/18;no major change. New wound which was apparently a taper removal injury today in our clinic between her 2 wounds on the right calf TheraSkins #2 07/14/18; I think there is some improvement in the right lateral ankle and the medial part of her wound. There is still exposed muscle medially. 07/28/18; two-week follow-up. TheraSkins#3. Unfortunately no major change. 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 She seems to be doing well and in the base of the wound appears to show some progress at this point. This is the last approved Theraskin of the second set. 08/25/18; she  has completed TheraSkin. There has been some improvement on the right lateral calf wound as well as the anterior leg wounds. The open area to muscle medially on the anterior leg wound is smaller. I'm  going to transition her back to [facility] under Kerlix Coban change every second day. She reports that she had some calcification removed from her right upper arm. We have had previous problems with calcifications in her wounds on her legs but that has not happened recently 09/08/18;using Hydrofera Blue on both her wound areas. Wounds seem to of contracted nicely. She uses Kerlix Coban wrap and changes at home herself 09/22/2018; using Hydrofera Blue on both her wound areas. Dimensions seem to have come down somewhat. There is certainly less depth in the medial part of the mid tibia wound and I do not think there is any exposed muscle at this point. 10/06/2018; 2-week follow-up. Using [facility] on her wound areas. Dimensions have come down nicely both on the right lateral ankle area in the right mid tibial area. She has no new complaints 10/20/2018; 2-week follow-up. She is using Hydrofera Blue. Not much change from the last time she was here. The area on the lateral ankle has less depth although it has raised edges on one side. I attempted to remove as much of the raised edge as I could without creating more additional wounds. The area on the right anterior mid tibia area looks the same. 11/03/2018; 2-week follow-up she is using Hydrofera Blue. On the right anterior leg she now has 2 wounds separated by a large area of normal skin. The area on the medial part still has I think exposed muscle although this area itself is a lot smaller. The area laterally has some depth. Both areas with necrotic debris. The area on her right lateral ankle has come in nicely 11/17/2018; patient continues to use Hydrofera Blue. We have been increasing separation of the 2 wounds anteriorly which were at one point joint the area on the right lateral calf continues to have I think some improvement in depth. 12/08/2018; patient continues to use Hydrofera Blue. There is some improvement in the area on the right  lateral calf. The 2 areas that were initially part of the original large wound in the mid right tibia are probably about the same. In fact the medial area is probably somewhat larger. We will run puraply through the patient's insurance 12/22/2018; she has been using Hydrofera Blue. We have small wounds on the right lateral calf and 2 small areas that were initially part of a large wound in the right mid tibia. We applied pure apply #1 today. 12/29/2018; we applied puraply #2. Her wounds look somewhat better especially on the right lateral calf and the lateral part of her original wound in the mid tibial area. 01/05/2019; perhaps slightly improved in terms of wound bed condition but certainly not as much improvement as I might of liked. Puraply #3 1/13: we did not have a correct sized puraply to apply. wounds more pinched out looking, I increased her compression to 3 layer last week to help with significant multilevel venous hypertension. Since then I've reviewed her arterial status. She has a right femoral endarterectomy and a distal left SFA stent. She was being followed by Dr. [name] for a period however she does not appear to have seen him in 3 years. I will set up an appointment. 1/20. The patient has an additional wound on the right lateral calf between the distal wound  and proximal wounds. We did not have Puraply last week. Still does not have a follow-up with Dr. early 1/27: Follow-up with Dr. early has been arranged apparently with follow-up noninvasive studies. Wounds are measuring roughly the same although they certainly look smaller 2/3; the patient had non invasive studies. Her ABIs on the right were 0.83 and on the left 1.02 however there was no great toe pressure bilaterally. Also worrisome monophasic waveforms at the PTA and dorsalis pedis. We are still using Puraply. We have had some improvement in all of the wounds especially the lateral part of the mid tibial area. 2/10; sees Dr.  early of vein and vascular re-arterial studies next week. Puraply reapplied today. 2/17; Dr. early of vein and vascular his appointment is tomorrow. Puraply reapplied after debridement of all wounds 2/24; the patient saw Dr. early I reviewed his note. sHe noted the previous right femoral endarterectomy with a Dacron patch. He also noted the normal ABI and the monophasic waveforms suggesting tibial disease. Overall he did not feel that she had any evidence of arterial insufficiency that would impair her wound healing. He did note her venous disease as well. He suggested PRN follow-up. 3/2; I had the last puraply applied today. The original wounds over the mid tibia area are improved where is the area on the right lower leg is not 3/9; wounds are smaller especially in the right mid tibia perhaps slightly in the right lateral calf. We finished with puraply and went to endoform today 3/23; the patient arrives after 2 weeks. She has been using endoform. I think all of her wounds look slightly better which includes the area on the right lateral calf just above the right lateral malleolus and the 2 in the right mid tibia which were initially part of the same wound. 4/27; TELEHEALTH visit; the patient was seen for telehealth visit today with her consent in the middle of the worldwide epidemic. Since she was last here she called in for antibiotics with pain and tenderness around the area on the right medial ankle. I gave her empiric doxycycline. She states this feels better. She is using endoform on both of these areas 5/11 TELEHEALTH; the patient was seen for telehealth visit today. She was accompanied at home by her husband. She has severe pulmonary hypertension accompanied scleroderma and in the face of the Covid epidemic cannot be safely brought into our clinic. We have been using endoform on her wound areas. There are essentially 3 wound areas now in the left mid tibia now 2 open areas that it one  point were connected and one on the right lateral ankle just above the malleolus. 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 She cannot come to clinic. The wounds were reviewed today via telehealth. She has severe chronic venous hypertension which I think is centrally mediated. She has wounds on her right anterior tibia and right lateral ankle area. These are chronic. She has been using endoform. 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 We have been following her from telehealth. She has severe chronic venous hypertension which may be mostly centrally mediated secondary to right heart heart failure. She has wounds on her right anterior tibia and right lateral  ankle these are chronic we have been using endoform 6/22; TELEHEALTH; this patient was seen today via telehealth. 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 Since the last time we had contact with this patient she developed some pain and erythema around the wound on her right lateral malleolus/ankle and we put in antibiotics for her. This is resulted in good improvement with resolution of the erythema and tenderness. I changed her to silver alginate last time. We had been using endoform for an extended period of time 7/13; TELEHEALTH; this patient was seen today via telehealth. 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 She would continue to be at a prohibitive risk to be brought into our clinic unless this was absolutely necessary. These visits have been done with her approval as well as her husband. We  have been using silver alginate to the areas on the right mid tibia and right lateral lower leg. 7/27 TELEHEALTH; patient was seen along with her husband today via telehealth. 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 We changed her to sample last visit. She has 2 areas a chronic wound on her right mid tibia and one just above her ankle. These were not the original wounds when she came into this clinic but she developed them during treatment 8/17; she comes in for her first face-to-face visit in a long period. She has a remaining area just medial to the right tibia which is the last open part of her large wound across this area. She also has an area on the right lateral lower leg. We prescribed Santyl last telehealth visit but they were concerned that this was making a deeper so they put silver alginate on it last week. Her husband changes the dressings. 8/31; using Santyl to the 2 wound areas some improvement in wound surfaces. Husband has surgery in 2 weeks we will put her out 3 weeks. Any of the advanced treatment options that I can think of that would be eligible for this wound would also cause her to have to come in weekly. The risk that the patient is just too high 9/21. Using Santyl to the 2 wound areas. Both of these are somewhat better although the medial mid tibia area still has exposed muscle. Lateral ankle requiring debridement. Using Santyl 10/12; using Santyl to the 2 wound areas. One on the right lateral ankle and the other in the medial calf which still has exposed muscle. Both areas have come down in size and have better looking surfaces. She has made nice progress with santyl 11/2; we have been using Santyl to the 2 wound areas. Right lateral ankle and the other in the mid tibia area the medial part of this still has exposed muscle. 11/23/2019 on evaluation today patient appears to be  doing about the same really with regard to her wounds. She is actually not very pleased with how things seem to be progressing at this point she tells me that she really has not noted much improvement unfortunately. With that being said there is no signs of active infection at this time. There is some slough buildup noted at this time which again along with some dry skin around the edges of the wound I think would benefit her to try to debride some of this away. Fortunately her pain is  doing fairly well. She still has exposed muscle in the right medial/tibial area. 12/14; TELEHEALTH; she was changed to [facility] to the right calf wound and right lateral ankle wound when she was here last time. Unfortunately since then she had a fall with a pelvic fracture and a fracture of her wrist. She was apparently hospitalized for 5 days. I have not looked at her discharge summary. She apparently came out of the hospital with a blister on her right heel. She was seen today via telehealth by myself and our case manager. The patient and her husband were present. She has been using Hydrofera Blue at our direction from the last time she was in the clinic. There is been no major improvement in fact the areas appear deeper and with a less viable surface 01/04/2020; TELEHEALTH; the patient was seen today in accompaniment of her husband and our nurse. She has 3 open areas 1 on the right medial mid tibia, one on the right lateral ankle and a large eschared pressure area on the right heel. We have been using Iodoflex to the 2 original wounds. 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 It is simply too perilous for her to be seen in any other way 1/26; TELEHEALTH; the patient was seen today in accompaniment of her husband and our RN one of our nurses. She still has the 3 open areas 1 on the right medial mid tibia which is the remanent of a more extensive wound in this area, one on  the right lateral ankle and a large eschared pressure area on the right heel. We have been using Iodoflex to the 2 original wounds and a bed at 9 application to the eschared area on the heel 2/15; the first time we have seen this patient and then several months out of concern for the pandemic. She had a large horizontal wound in the mid tibia. Only the medial aspect of this is still open. Area on the lateral ankle is just about closed. She had a new pressure ulcer on the right heel which I have removed some of the eschar. We have been using Iodoflex which I will continue. The area in the mid tibia has a round circle in the middle of exposed muscle. I think we would have to use an advanced treatment product to stimulate granulation over this area. We will run this through her insurance. She is not eligible for plastic surgery for many reasons 3/1; TELEHEALTH. The patient was seen today by telehealth. 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 We have been using Iodoflex to the wound areas which include the right anterior mid tibia, right lateral ankle and the right heel. All of these were reviewed Electronic Signature(s) Signed: 02/29/2020 5:45:55 PM By: Linton Ham MD Entered By: Linton Ham on 02/29/2020 12:54:02 -------------------------------------------------------------------------------- Physical Exam Details Patient Name: Date of Service: [name] [name] 02/29/2020 11:30 AM Medical Record Number:[id number] Patient Account Number: [id number] Date of Birth/Sex: Treating RN: 1951-09-04 (69 y.o. [name] Primary Care Provider: [name] Other Clinician: Referring Provider: Treating Provider/Extender:Moira Umholtz, Leotis Shames, MARY Weeks in Treatment: 414 Constitutional Sitting or standing Blood Pressure is within target range for patient.. Pulse regular and within target range  for patient.Marland Kitchen Respirations regular, non-labored and within target range.. Temperature is normal and within the target range for the patient.Marland Kitchen Appears in no distress. Notes Wound exam; the area is in  the mid tibia has a small area of exposed muscle this looks about the same. Right lateral ankle does not have any open area that I can see but I am not sure about the viability of the eschar over the surface The right plantar heel has again black eschar here. I had remove this last time and bit surprised to see this back Electronic Signature(s) Signed: 02/29/2020 5:45:55 PM By: Linton Ham MD Entered By: Linton Ham on 02/29/2020 12:54:55 -------------------------------------------------------------------------------- Physician Orders Details Patient Name: Date of Service: [name] [name]. 02/29/2020 11:30 AM Medical Record Number:[id number] Patient Account Number: [id number] Date of Birth/Sex: Treating RN: 11-Nov-1951 (69 y.o. [name] Primary Care Provider: [name] Other Clinician: Referring Provider: Treating Provider/Extender:Magdeline Prange, Leotis Shames, MARY Weeks in Treatment: [phone] Verbal / Phone Orders: No Diagnosis Coding Follow-up Appointments Return Appointment in 1 week. Dressing Change Frequency Wound #16 Right Calcaneus Change Dressing every other day. Wound #5 Right,Medial Lower Leg Change Dressing every other day. Wound #7 Right,Lateral Malleolus Change Dressing every other day. Skin Barriers/Peri-Wound Care Moisturizing lotion - both legs Wound Cleansing May shower and wash wound with soap and water. - on days that dressing is changed Primary Wound Dressing Wound #16 Right Calcaneus Other: - paint with betadine Wound #5 Right,Medial Lower Leg Iodoflex Wound #7 Right,Lateral Malleolus Iodoflex Secondary Dressing Wound #16 Right Calcaneus Dry Gauze Heel Cup Wound #5 Right,Medial Lower Leg Dry Gauze Wound #7 Right,Lateral Malleolus Dry  Gauze Edema Control Kerlix and Coban - Right Lower Extremity Avoid standing for long periods of time Elevate legs to the level of the heart or above for 30 minutes daily and/or when sitting, a frequency of: - throughout the day Off-Loading Turn and reposition every 2 hours Other: - float heels off of bed/chair with pillow under calves [facility] for wound care. Alvis Lemmings Electronic Signature(s) Signed: 02/29/2020 5:45:55 PM By: Linton Ham MD Signed: 02/29/2020 5:57:14 PM By: Levan Hurst RN, BSN Entered By: Levan Hurst on 02/29/2020 12:47:46 -------------------------------------------------------------------------------- Problem List Details Patient Name: Date of Service: AARALYNN, SHEPHEARD 02/29/2020 11:30 AM Medical Record Number:[id number] Patient Account Number: [id number] Date of Birth/Sex: Treating RN: 1951/11/23 (69 y.o. 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 02/24/2015 No Yes of calf and inflammation I87.331 Chronic venous hypertension (idiopathic) with ulcer 10/04/2016 No Yes and inflammation of right lower extremity L89.616 Pressure-induced deep tissue damage of right heel 12/14/2019 No Yes Inactive Problems ICD-10 Code Description Active Date Inactive Date 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/19/2016 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 [name] Primary Care Provider: [name] Other Clinician: Referring Provider: Treating Provider/Extender:Liviah Cake, Leotis Shames, MARY Weeks in Treatment: [phone] Subjective History of Present Illness (HPI) this is  a patient who initially came to [place] for wounds on the medial malleoli bilaterally as well as her upper medial lower extremities bilaterally. These wounds eventually healed with assistance of Apligraf's bilaterally. While this was occurring she developed the current wound which opened into a fairly substantial wound on the right lower extremity. These are mostly secondary to venous stasis physiology however the patient also has underlying scleroderma, pulmonary hypertension. The wound has been making good progress lately with the Hydrofera Blue-based dressing. 03/17/2015; patient had a arterial evaluation a year ago. Her right ABI was 0.86 left was 1.0. Toe brachial index was 0.41 on the right 0.45 on the left. Her bilateral common femoral artery waveforms were triphasic. Her white popliteal posterior tibial artery and anterior tibial artery waveforms were monophasic with good amplitude. Luteal artery waveforms were biphasic it was felt that her bilateral great toe pressures are of normal although adequate for tissue healing. 03/24/2015. The condition of this wound is not really improved that. He was covered with as fibrinous surface slough and eschar. This underwent an aggressive debridement with both a curette and  scalpel. I still cannot really get down to what I can would consider to be a viable surface. There is no evidence of infection. Previous workup for ischemia roughly a year ago was negative nevertheless I think that continues to be a concern 04/07/15. The patient arrived for application of second Apligraf. Once again the surface of this wound is certainly less viable than I would like for an advanced treatment option. An aggressive debridement was done. She developed some arteriolar bleeding which required that along pressure and silver alginate. 04/21/15: change in the condition of this wound. Once again it is covered in a gelatinous surface slough. After debridement today surface of the wound looks somewhat better but now a heeling surface 05/05/15 Apligraf #4 applied.Still a lot of slough on this wound. 05/19/15 Apligraf #5 applied. Still a lot of slough on this wound I did not aggressively remove this 06/02/15; continued copious amounts of surface slough. This debridement fairly easily. 06/08/2015 -- the last time she had a venous duplex study done was over 3 years ago and the surgery was prior to that. I have recommended that she sees Dr. early for a another opinion regarding a repeat venous duplex and possibly more endovenous ablation of vein stripping of micro-phlebotomies. 06/16/15; wound has a gelatinous surface eschar that the debridement fairly easily to a point. I don't disagree with the venous workup and perhaps even arterial re-evaluation. She is on prednisone 5 mg and continue his medications for her pulmonary artery hypertension I am not sure if the latter have any wound care healing issues I would need to investigate this. 06/23/15 continues with a gelatinous surface eschar with of fibrinous underlying. What I can see of this wound does not look unhealthy however I just can't get this material which I think is 2 different layers off. Empiric culture done 06/30/15; unfortunately not a lot of  change in this wound. A gelatinous surface eschar is easily removed however it has a tight fibrinous surface underneath the. 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 The wound comes back and basically and unchanged state the. She has a gelatinous surface that is more easily removed however there is a tightly fibrinous surface underneath the. There is no evidence of infection. She has a vascular follow-up next month. I would have to inject her in order to do a more aggressive debridement  of this area 07/21/15 the wound is roughly in the same state albeit the debridement is done with greater ease. There is less of the fibrinous eschar underneath the. There is no evidence of infection. She has follow-up with vascular surgery next week. No evidence of surrounding infection. Her original distal wounds healed while this one formed. 07/28/15; wound is easier to debride. No wound erythema. She is seeing Dr. [name] next week. 08/18/15 Has been to [facility] for repeat ablation. Have been using medihoney pad with some improvement 08/25/15; absolutely no change in the condition of this wound in either its overall size or surface condition in many months now. At one point I had this down to [place] healthier surface I think with [facility] however this did not actually progress towards closure. Do not believe that the wound has actually deteriorated in terms of volume at all. We have been using a medihoney pad which allows easier debridement of the gelatinous eschar but again no overall actual improvement. the patient is going towards an ablation with pain and vascular which I think is scheduled for next week. The only other investigation that I could first see would be a biopsy. She does have underlying scleroderma 09/02/15 eschar is much easier to debridement however the base of this does not look particularly vibrant. We changed to Iodoflex. The patient had her ablation earlier this  week 09/09/15 again the debridement over the base of this wound is easier and the base of the wound looks considerably better. We will continue the Iodoflex. Dr. Tawni Millers has expressed his satisfaction with the result of her ablation 09/15/15 once again the wound is relatively free of surface eschar. No debridement was done today. It has a pale- looking base to it. although this is not as deep as it once was it seems to be expanding especially inferiorly. She has had recent venous ablation but this is no closer to healing.I've gone ahead and done a punch biopsy this from the inferior part of the wound close to normal skin 09/22/15: the wound is relatively free of surface eschar. There is some surrounding eschar. I'm not exactly sure at what level the surface is that I am seeing. 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 No evidence of atypical infection, malignancy. 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 No debridement was done 09/29/15; the wound surface is relatively free of surface eschar. There is eschar surrounding the walls of the wound. Aside from the improvement in the amount of surface slough. This wound has not progressed any towards closure. There is not even a surface that looks like there at this is ready. There is no evidence of any infection nor maligancy based on biopsy I did on 9/15. I continued with the Iodoflex however I am looking towards some alternative to try to promote some closure or filling in of this surface. Consider triple layer Oasis. Collagen did not result in adequate control of the surface slough 10/13/15; the patient was in hospital last week with severe anemia. The wound looks somewhat better after debridement although there is widening medially. There is no evidence of infection. 10/20/15; patient's wound on the right lateral lower leg is essentially unchanged. This underwent a light surface debridement and  in general the debridement is easier and the surface looks improved. I noted in doing this on the side of the wound what appears to be a piece of calcium deposition. The patient noted that she had previous  things on the tips of her fingers. 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 She follows with rheumatology and I have asked them to talk to her about this. In view of that S the nonhealing ulcer may have something to do with calcinosis and also unrelieved Raynaud's disease in this area. I should note that her original wounds on the right and left medial malleolus and the inner aspects of both legs just below the knees did however heal over 10/30/15; the patient's wound on the right lateral lower leg is essentially unchanged. I was able to remove some calcified material from the medial wound edge. 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 Otherwise the wound appears essentially unchanged there is less adherent surface eschar. Some of the calcified material was sent to pathology for analysis 11/17/15. The patient's wound on the right lateral leg is essentially unchanged. Wider Medially. 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 There is some surface slough but I did not debridement this. No evidence of surrounding infection. 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 This will make healing difficult 12/15/15; the wound has less depth surface has a fibrinous slough and calcifications in the wound edge no evidence of infection 12/22/15; the wound definitely has less Fibrinous slough on the  base. Calcifications around the wound edges are still evident. Although the wound bed looks healthier it is still pale in appearance. Previous biopsy did not show malignancy 01/04/15; surgical debridement of nonviable slough and subcutaneous tissue the wound cleans up quite nicely but appears to be expanding outward calcifications around the wound edges are still there. 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 She is to see her rheumatologist I'll see if he has any opinions on this. My punch biopsy done in September did not show calcifications although these are clearly evident. 01/19/16 light selective debridement of nonviable surface slough. There is epithelialization medially. This gives me reason for cautious optimism. She has been to see her rheumatologist, there is nothing that can be done for this type of soft tissue calcification associated with scleroderma 02/02/16 no debridement although there is a light surface slough. She has 2 peninsulas of skin 1 inferiorly and one medially. We continue to make a slight and slow but definite progressive here 02/16/16; light surface debridement with more attention to the circumference of the wound bed where the fibrinous eschar is more prevalent. No calcifications detected. She seems to have done nicely with the [facility] with some epithelialization and some improvement in the overall wound volume. 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 Continued attention to the circumference of the wound where the fibrinous eschar in calcinosis or prevalent. No calcifications were detected. She has continued improvement with Hydrofera Blue. The wound is no longer as deep 03/15/16 surgical debridement done to remove surface escha Especially around the circumference of the wound where there is  nonviable subcutaneous tissue. In spite of this there is considerable improvement in the overall dimensions and depth of the wound. Islands of epithelialization  are seen especially medially inferiorly and superiorly to a lesser extent. She is using Hydrofera Blue at home 03/29/16; surgical debridement done to remove surface eschar and nonviable subcutaneous tissue. This cleans up quite nicely mention slightly larger in terms of length and width however depth is less 04/12/16; continued gradual improvement in terms of depth and the condition of the wound base. Debridement is done. Continuing long standing Hydrofera Blue at home with Kerlix Coban wraps 04/26/16; continued gradual improvement in terms of depth and management as well as condition of the wound base. Surgical debridement done she continues with Hydrofera Blue. 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 She initially came to this clinic venous insufficiency ulcers which have long since healed 05/17/16 continued improvement in terms of the depth and measurements of this wound although she has a tightly adherent fibrinous slough each time. We've been continued with long standing Hydrofera Blue which seems to done as well for this wound is many advanced treatment options. 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 She also has chronic venous insufficiency. She has an irritation on her lateral right ankle secondary to our wraps 05/10/16; wound appears to be smaller especially on the medial aspect and especially in the width. Wound was debridement surface looks better. She is also been in the hospital apparently with anemia again she tells me she had an endoscopy. Since she got home after 3 days which I believe was sometime last week she has had an irritated painful area on the right lateral ankle surrounding the lateral malleolus 05/31/16; much more adherent surface slough  today then recently although I don't think the dimensions of changed that much. A more aggressive debridement is required. The irritated area over her medial malleolus is more pruritic and painful and I don't think represents cellulitis 06/14/16; no major change in her wound dimensions however there is more tightly adherent surface slough which is disappointing. As well as she appears to have a new small area medially. Furthermore an irritated uncomfortable area on the lateral aspect of her right foot just below the lateral malleolus. 06/21/16; I'm seeing the patient back in follow-up for the new areas under her major wound on the right anterior leg. She has been using Hydrofera Blue to this area probably for several months now and although the dressings seem to be helping for quite a period of time I think things have stagnated lately. She comes in today with a relatively tight adherent surface slough and really no changes in the wound shape or dimension. The 2 small areas she had inferiorly are tiny but still open they seem improved this well. There is no uncontrolled edema and I don't think there is any evidence of cellulitis. 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 Patient has had vascular evaluations both venous and arterial. I have biopsied this area. There is no obvious infection. The worrisome thing today is that she seems to be developing areas of erythema and epithelial damage on the medial aspect of the right foot. Also to a lesser degree inferior to the actual wound itself. Again I see no obvious changes to suggest cellulitis however as this is the only treatable option I will probably give her antibiotics. 07/13/16 no major change in the lady's large anterior right leg wound. Still covered with a very tightly adherent  surface slough which is difficult to debridement. There is  less erythema around this, culture last week grew pseudomonas I gave her ciprofloxacin. The area on her lateral right malleolus looks better- 08/02/16 the patient's wounds continued to decline. Her original large anterior right leg wound looks deeper. Still adherent surface slough that is difficult to debridement. She has a small area just below this and a punched-out wound just below her lateral malleolus. In the meantime she is been in hospital with apparently an upper GI bleed on Plavix and aspirin. She is now just on Plavix she received 3 units of packed cells 08/23/16; since I last saw this 3 weeks ago, the open large area on her right leg looks about the same syrup. She has a small satellite lesion just underneath this. The area on her medial right ankle is now a deep necrotic wound. I attempted to debridement this however there is just too much pain. 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 She follows with vascular surgery and is scheduled for an angiogram in early September 09/06/16; the patient is going for an arteriogram tomorrow. Her original large wound on the right calf is about the same the satellite lesion underneath it is about the same however the area on her medial ankle is now deeper with exposed tendon. I am no longer attempting to debride these wounds 09/20/16; the patient has undergone a right femoral endarterectomy and Dacron patch angioplasty. This seems to have helped the flow in her right leg. 10/04/16; Arrived today for aggressive debridement of the wounds on her right calf the original wound the one beneath it and a difficult area over her right lateral leg just above the lateral malleolus. 10/25/16; her 3 open wounds are about the same in terms of dimensions however the surface appears a lot healthier post debridement. Using Iodoflex 10/18/16 we have been using Iodoflex to her wounds which she  tolerated with some difficulty. 10/11/16; has been using Santyl for a period of time with some improvement although again very adherent surface slough would prevent any attempted healing this. She has a original wound on the left calf, the satellite underneath that and the most recent wound on the right medial ankle. She has completed revascularization by Dr. early and has had venous ablation earlier. Want to go back to Iodoflex to see if week and get a healthier surface to this wound bed failing this I think she'll need to be taken to the OR and I am prepared to call Dr. Marla Roe to discuss this. She is obviously not a good candidate for general anesthesia however.; 11/08/16; I put her on Iodoflex last time to see if I can get the wound bed any healthier and unfortunately today still had tremendous surface slough. 11/15/16; 4 weeks' worth of Iodoflex with not much improvement. Debridement on the major wounds on her left anterior leg is easier however this does not maintain from week to week. The punched-out area on her medial right ankle 11/29/16; I attempted to change the patient last visit to [facility] however she states this burned and was very uncomfortable therefore we gave her permission to go back to the eye out of complex which she already had at home. Also she noted a lot of pain and swelling on the lateral aspect of her leg before she traveled to [facility] for the holidays, I called her in doxycycline over the phone.  This seems to have helped 12/06/16; Wounds unchanged by in large. Using Iodoflex 12/13/16; her wounds today actually looks somewhat better. The area on the right lateral lower leg has reasonably healthy-looking granulation and perhaps as actually filled and a bit. Debridement of the 2 wounds on the medial calf is easier and post debridement appears to have a healthier base. We have referred her to Dr. Migdalia Dk for consideration of operative  debridement 12/20/16; we have a quick note from Dr. Merri Ray who feels that the patient needs to be referred to an academic center/plastic surgeon. This is due to the complexity of the patient's medical issues as it applies to anything in the OR. We have been using Iodoflex 12/27/16; in general the wounds on the right leg are better in terms of the difficult to remove surface slough. She has been using Iodoflex. She is approved for Apligraf which I anticipated ordering in the next week or 2 when we get a better-looking surface 01/04/16 the deep wounds on the right leg generally look better. Both of them are debrided further surface slough. The area on the lateral right leg was not debrided. She is approved for Apligraf I think I'll probably order this either next week or the week after depending on the surface of the wounds superiorly. We have been using Iodoflex which will continue until then 01/11/16; the deep wounds on the right leg again have a surface slough that requires debridement. I've not been able to get the wound bed on either one of these wounds down to what would be acceptable for an advanced skin stab- like Apligraf. The area on the medial leg has been improving. We have been using hot Iodoflex to all wounds which seems to do the best at at least limiting the nonviable surface 01/24/17; we have continued Iodoflex and all her wound areas. Her debridement Gen. he is easier and the surface underneath this looks viable. Nevertheless these are large area wounds with exposed muscle at least on the anterior parts. We have ordered Apligraf's for 2 weeks from now. The patient will be away next week 02/07/17; the patient was close to have first Apligraf today however we did not order one. I therefore replaced her Iodoflex. She essentially has 3 large punched-out areas on her right anterior leg and right medial ankle. 02/11/17; Apligraf #1 02/25/17; Apligraf #2. In general some improvement  in the right medial ankle and right anterior leg wounds. The larger wound medially perhaps some better 03/11/17; Apligraf #3. In general continued improvement in the right medial ankle and the right anterior leg wounds. 03/25/17 Apligraf #4. In general continued improvement especially on the right medial ankle and the lower 04/08/17; Apligraf #5 in general continued improvement in all wound sites. 04/22/17; post Apligraf #5 her wound beds continued to look a lot better all of them up to the surface of the surrounding skin. Had a caramel-colored slough that I did not debrided in case there is residual Apligraf effect. The wounds are as good as I have seen these looking quite some time. 04/29/17; we applied [place] and last week after completing her Apligraf. Wounds look as though they've contracted somewhat although they have a nonviable surface which was problematic in the past. Apparently she has been approved for further Apligraf's. We applied Iodosorb today after debridement. 05/06/17; we're fortunate enough today to be able to apply additional Apligraf approved by her insurance. In general all of her wounds look better Apligraf #6 05/24/17; Apligraf #7 continued  improvement in all wounds o3 06/10/17 Apligraf #8. Continued improvement in the surface of all wounds. Not much of an improvement in dimensions as I might a follow 06/24/17 Apligraf #9 continued general improvement although not as much change in the wound areas I might of like. She has a new open area on the right anterior lateral ankle very small and superficial. She also has a necrotic wound on the tip of her right index finger probably secondary to severe uncontrolled Raynaud's phenomenon. She is already on sildenafil and already seen her rheumatologist who gave her Keflex. 07/08/17; Apligraf #10. Generalized improvement although she has a small additional wound just medial to the major wound area. 07/22/17; after discussion we decided  not to reorder any further Apligraf's although there is been considerable improvement with these it hasn't been recently. The major wound anteriorly looks better. Smaller wounds beneath this and the more recent one and laterally look about the same. The area on the right lateral lower leg looks about the same 07/29/17 this is a patient who is exceedingly complex. She has advanced scleroderma, crest syndrome including calcinosis, PAD status post revascularization, chronic venous insufficiency status post ablations. She initially presented to this clinic with wounds on her bilateral lower legs however these closed. More recently we have been dealing with a large open area superiorly on the right anterior leg, a smaller wound underneath this and then another one on the right medial lower extremity. These improve significantly with 10 Apligraf applications. Over the last 2-3 weeks we are making good progress with Hydrofera Blue and these seem to be making progress towards closure 08/19/17; wounds continued to make good improvement with Hydrofera Blue and episodic aggressive debridements 08/26/17; still using Hydrofera Blue. Good improvements 09/09/17; using Hydrofera Blue continued improvement. Area on the lateral part of her right leg has only a small remaining open area. The small inferior satellite region is for all intents and purposes closed. Her major wound also is come in in terms of depth and has advancing epithelialization. 09/16/17; using Hydrofera Blue with continued improvement. The smaller satellite wound. We've closed out today along with a new were satellite wound medially. The area on her medial ankle is still open and her major wound is still open but making improvement. All using Hydrofera Blue. Currently 09/30/17; using Hydrofera Blue. Still a small open area on the lateral right ankle area and her original major wound seems to be making gradual and steady improvement. 10/14/17; still  using Hydrofera Blue. Still too small open areas on the right lateral ankle. Her original major wound is horizontal and linear. The most problematic area paradoxically seems to be the area to the medial area wears I thought it would be the lateral. 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 10/28/17; still using Hydrofera Blue. Right lateral ankle has a very small open area superiorly on the most lateral part of the wound. Her original open wound has 2 open areas now separated by normal skin and we've redefined this. 11/11/17; still using Hydrofera Blue area and right lateral ankle continues to have a small open area on mostly the lateral part of the wound. Her original wound has 2 small open areas now separated by a considerable amount of normal skin 11/28/17; the patient called in slightly before Thanksgiving to report pain and erythema above the wound on the right leg. In the past this is responded well to treatment for cellulitis and I gave  her over the phone doxycycline. She stated this resulted in fairly abrupt improvement. We have been using Hydrofera Blue for a prolonged period of time to the larger wound anteriorly into the remaining wound on her right right lateral ankle. The latter is just about closed with only a small linear area and the bottom of the [place]. 12/02/17; use endoform and left the dressing on since last visit. There is no tenderness and no evidence of infection. 12/16/17; patient has been using Endoform but not making much progress. The 2 punched out open areas anteriorly which were the reminiscence of her major wound appear deeper. The area on the lateral aspect of her right calf also appears deeper. Also she has a puzzling tender swelling above her wound on the right leg. This seems larger than last time. Just above her wounds there appears to be some fluctuance in this area it is not erythematous and there is no  crepitus 12/30/17; patient has been using Endoform up until last week we used Hydrofera Blue. Ultrasound of the swelling above her 2 major wounds last time was negative for a fluid collection. I gave her cefaclor for the erythema and tenderness in this area which seems better. Unfortunately both punched out areas anteriorly and the area on her right medial lower leg appear deeper. In fact the lateral of the wounds anteriorly actually looks as though it has exposed tendon and/or muscle sheath. She is not systemically unwell. She is complaining of vaginitis type symptoms presumably Candida from her antibiotics. 01/06/18; we're using santyl. she has 2 punched out areas anteriorly which were initially part of a large wound. Unfortunately medially this is now open to tendon/muscle. All the wounds have the same adherent very difficult to debride surface. 01/20/18; 2 week follow-up using Santyl. She has the 2 punched out areas anteriorly which were initially part of her large surface wound there. Medially this still has exposed muscle. All of these have the same tightly adherent necrotic surface which requires debridement. PuraPly was not accepted by the patient's insurance however her insurance I think it changed therefore we are going to run Apligraf to gain 02/03/18; the patient has been using Santyl. The wound on the right lateral ankle looks improved and the 2 areas anteriorly on the right leg looks about the same. The medial one has exposed muscle. The lateral 1 requiresdebridement. We use PuraPly today for the first week 02/10/18; PuraPly #2. The patient has 3 wounds. The area on the right lateral ankle, 2 areas anteriorly that were part of her original large wound in this area the medial one has exposed muscle. All of the wounds were lightly debrided with a number 3 curet. PuraPly #2 applied the lateral wound on the calf and the right lateral ankle look better. 02/17/18; PuraPly #3. Patient has 3  wounds. The area on the right lateral ankle in 2 areas internally that were part of her original large wound. The lateral area has exposed muscle. She arrived with some complaints of pain around the right ankle. 02/24/18; PuraPly #4; not much change in any of the 3 wound areas. Right lateral ankle, right lateral calf. Both of these required debridement with a #3 curet. She tolerates this marginally. The area on the medial leg still has exposed muscle. Not much change in dimensions 03/03/18 PuraPly #5. The area on the medial ankle actually looks better however the 2 separate areas that were original parts of the larger right anterior leg wound look as though they're  attempting to coalesce. 03/10/18; PuraPly #6. The area on the medial ankle actually continues to look and measures smaller however the 2 separate areas that were part of the original large wound on the right anterior leg have now coalesced. There hasn't been much improvement here. The lateral area actually has underlying exposed muscle 03/17/18-she is here in follow-up evaluation for ulcerations to the right lower extremity. She is voicing no complaints or concerns. She tolerated debridement. Puraply#7 placed 03/24/18; difficult right lower extremity ulcerations. PuraPly #8 place. She is been approved for [employer]. She did very well with Apligraf today however she is apparently reached her "lifetime max" 03/31/18; marginal improvement with PuraPly although her wounds looked as good as they have in several weeks today. Used TheraSkins #1 04/14/18 TheraSkin #2 today 04/28/18 TheraSkins #3. Wound slightly improved 05/12/18; TheraSkin skin #4. Wound response has been variable. 05/27/18 TheraSkin #5. Generally improvement in all wound areas. I've also put her in 3 layer compression to help with the severe venous hypertension 06/09/18; patient is done quite well with the TheraSkins unfortunately we have no further applications. I also put her in 3  layer compression last week and that really seems to of helped. 06/16/18; we have been using silver collagen. Wounds are smaller. Still be open area to the muscle layer of her calf however even that is contracted somewhat. She tells me that at night sometimes she has pain on the right lateral calf at the site of her lower wound. Notable that I put her into 3 layer compression about 3 weeks ago. She states that she dangles her leg over the bed that makes it feel better but she does not describe claudication during the day ooShe is going to call her secondary insurance to see if they will continue to cover advanced treatment products I have reviewed her arterial studies from 01/22/17; this showed an ABI in the right of 1 and on the left noncompressible. TBI on the right at 0.30 on the left at 0.34. It is therefore possible she has significant PAD with medial calcification falsely elevating her ABI into the normal range. I'll need to be careful about asking her about this next week it's possible the 3 layer compression is too much 06/23/18; was able to reapply TheraSkin 1 today. Edema control is good and she is not complaining of pain no claudication 07/07/18;no major change. New wound which was apparently a taper removal injury today in our clinic between her 2 wounds on the right calf TheraSkins #2 07/14/18; I think there is some improvement in the right lateral ankle and the medial part of her wound. There is still exposed muscle medially. 07/28/18; two-week follow-up. TheraSkins#3. Unfortunately no major change. She is not a candidate I don't think for skin grafting due to severe venous hypertension associated with her scleroderma and pulmonary hypertension 08/11/18 Patient is here today for her Theraskin application #02 (#5 of the second set). She seems to be doing well and in the base of the wound appears to show some progress at this point. This is the last approved Theraskin of the second  set. 08/25/18; she has completed TheraSkin. There has been some improvement on the right lateral calf wound as well as the anterior leg wounds. The open area to muscle medially on the anterior leg wound is smaller. I'm going to transition her back to [facility] under Kerlix Coban change every second day. She reports that she had some calcification removed from her right upper arm. We  have had previous problems with calcifications in her wounds on her legs but that has not happened recently 09/08/18;using Hydrofera Blue on both her wound areas. Wounds seem to of contracted nicely. She uses Kerlix Coban wrap and changes at home herself 09/22/2018; using Hydrofera Blue on both her wound areas. Dimensions seem to have come down somewhat. There is certainly less depth in the medial part of the mid tibia wound and I do not think there is any exposed muscle at this point. 10/06/2018; 2-week follow-up. Using [facility] on her wound areas. Dimensions have come down nicely both on the right lateral ankle area in the right mid tibial area. She has no new complaints 10/20/2018; 2-week follow-up. She is using Hydrofera Blue. Not much change from the last time she was here. The area on the lateral ankle has less depth although it has raised edges on one side. I attempted to remove as much of the raised edge as I could without creating more additional wounds. The area on the right anterior mid tibia area looks the same. 11/03/2018; 2-week follow-up she is using Hydrofera Blue. On the right anterior leg she now has 2 wounds separated by a large area of normal skin. The area on the medial part still has I think exposed muscle although this area itself is a lot smaller. The area laterally has some depth. Both areas with necrotic debris. ooThe area on her right lateral ankle has come in nicely 11/17/2018; patient continues to use Hydrofera Blue. We have been increasing separation of the 2 wounds anteriorly  which were at one point joint the area on the right lateral calf continues to have I think some improvement in depth. 12/08/2018; patient continues to use Hydrofera Blue. There is some improvement in the area on the right lateral calf. The 2 areas that were initially part of the original large wound in the mid right tibia are probably about the same. In fact the medial area is probably somewhat larger. We will run puraply through the patient's insurance 12/22/2018; she has been using Hydrofera Blue. We have small wounds on the right lateral calf and 2 small areas that were initially part of a large wound in the right mid tibia. We applied pure apply #1 today. 12/29/2018; we applied puraply #2. Her wounds look somewhat better especially on the right lateral calf and the lateral part of her original wound in the mid tibial area. 01/05/2019; perhaps slightly improved in terms of wound bed condition but certainly not as much improvement as I might of liked. Puraply #3 1/13: we did not have a correct sized puraply to apply. wounds more pinched out looking, I increased her compression to 3 layer last week to help with significant multilevel venous hypertension. Since then I've reviewed her arterial status. She has a right femoral endarterectomy and a distal left SFA stent. She was being followed by Dr. [name] for a period however she does not appear to have seen him in 3 years. I will set up an appointment. 1/20. The patient has an additional wound on the right lateral calf between the distal wound and proximal wounds. We did not have Puraply last week. Still does not have a follow-up with Dr. early 1/27: Follow-up with Dr. early has been arranged apparently with follow-up noninvasive studies. Wounds are measuring roughly the same although they certainly look smaller 2/3; the patient had non invasive studies. Her ABIs on the right were 0.83 and on the left 1.02 however there was  no great toe pressure  bilaterally. Also worrisome monophasic waveforms at the PTA and dorsalis pedis. We are still using Puraply. We have had some improvement in all of the wounds especially the lateral part of the mid tibial area. 2/10; sees Dr. early of vein and vascular re-arterial studies next week. Puraply reapplied today. 2/17; Dr. early of vein and vascular his appointment is tomorrow. Puraply reapplied after debridement of all wounds 2/24; the patient saw Dr. early I reviewed his note. sHe noted the previous right femoral endarterectomy with a Dacron patch. He also noted the normal ABI and the monophasic waveforms suggesting tibial disease. Overall he did not feel that she had any evidence of arterial insufficiency that would impair her wound healing. He did note her venous disease as well. He suggested PRN follow-up. 3/2; I had the last puraply applied today. The original wounds over the mid tibia area are improved where is the area on the right lower leg is not 3/9; wounds are smaller especially in the right mid tibia perhaps slightly in the right lateral calf. We finished with puraply and went to endoform today 3/23; the patient arrives after 2 weeks. She has been using endoform. I think all of her wounds look slightly better which includes the area on the right lateral calf just above the right lateral malleolus and the 2 in the right mid tibia which were initially part of the same wound. 4/27; TELEHEALTH visit; the patient was seen for telehealth visit today with her consent in the middle of the worldwide epidemic. Since she was last here she called in for antibiotics with pain and tenderness around the area on the right medial ankle. I gave her empiric doxycycline. She states this feels better. She is using endoform on both of these areas 5/11 TELEHEALTH; the patient was seen for telehealth visit today. She was accompanied at home by her husband. She has severe pulmonary hypertension accompanied  scleroderma and in the face of the Covid epidemic cannot be safely brought into our clinic. We have been using endoform on her wound areas. There are essentially 3 wound areas now in the left mid tibia now 2 open areas that it one point were connected and one on the right lateral ankle just above the malleolus. 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 She cannot come to clinic. The wounds were reviewed today via telehealth. She has severe chronic venous hypertension which I think is centrally mediated. She has wounds on her right anterior tibia and right lateral ankle area. These are chronic. She has been using endoform. 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 We have been following her from telehealth. She has severe chronic venous hypertension which may be mostly centrally mediated secondary to right heart heart failure. She has wounds on her right anterior tibia and right lateral ankle these are chronic we have been using endoform 6/22; TELEHEALTH; this patient was seen today via telehealth. 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 Since the last time we had contact with this patient she developed some pain and erythema around the wound on her right lateral malleolus/ankle and  we put in antibiotics for her. This is resulted in good improvement with resolution of the erythema and tenderness. I changed her to silver alginate last time. We had been using endoform for an extended period of time 7/13; TELEHEALTH; this patient was seen today via telehealth. She has severe pulmonary hypertension  secondary to scleroderma on chronic oxygen. She would continue to be at a prohibitive risk to be brought into our clinic unless this was absolutely necessary. These visits have been done with her approval as well as her husband. We have been using silver alginate to the areas on the right mid tibia and right lateral lower leg. 7/27 TELEHEALTH; patient was seen along with her husband today via telehealth. 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 We changed her to sample last visit. She has 2 areas a chronic wound on her right mid tibia and one just above her ankle. These were not the original wounds when she came into this clinic but she developed them during treatment 8/17; she comes in for her first face-to-face visit in a long period. She has a remaining area just medial to the right tibia which is the last open part of her large wound across this area. She also has an area on the right lateral lower leg. We prescribed Santyl last telehealth visit but they were concerned that this was making a deeper so they put silver alginate on it last week. Her husband changes the dressings. 8/31; using Santyl to the 2 wound areas some improvement in wound surfaces. Husband has surgery in 2 weeks we will put her out 3 weeks. Any of the advanced treatment options that I can think of that would be eligible for this wound would also cause her to have to come in weekly. The risk that the patient is just too high 9/21. Using Santyl to the 2 wound areas. Both of these are somewhat better although the medial mid tibia area still has exposed muscle. Lateral ankle requiring debridement. Using Santyl 10/12; using Santyl to the 2 wound areas. One on the right lateral ankle and the other in the medial calf which still has exposed muscle. Both areas have come down in size and have better looking surfaces. She has made  nice progress with santyl 11/2; we have been using Santyl to the 2 wound areas. Right lateral ankle and the other in the mid tibia area the medial part of this still has exposed muscle. 11/23/2019 on evaluation today patient appears to be doing about the same really with regard to her wounds. She is actually not very pleased with how things seem to be progressing at this point she tells me that she really has not noted much improvement unfortunately. With that being said there is no signs of active infection at this time. There is some slough buildup noted at this time which again along with some dry skin around the edges of the wound I think would benefit her to try to debride some of this away. Fortunately her pain is doing fairly well. She still has exposed muscle in the right medial/tibial area. 12/14; TELEHEALTH; she was changed to [facility] to the right calf wound and right lateral ankle wound when she was here last time. Unfortunately since then she had a fall with a pelvic fracture and a fracture of her wrist. She was apparently hospitalized for 5 days. I have not looked  at her discharge summary. She apparently came out of the hospital with a blister on her right heel. She was seen today via telehealth by myself and our case manager. The patient and her husband were present. She has been using Hydrofera Blue at our direction from the last time she was in the clinic. There is been no major improvement in fact the areas appear deeper and with a less viable surface 01/04/2020; TELEHEALTH; the patient was seen today in accompaniment of her husband and our nurse. She has 3 open areas 1 on the right medial mid tibia, one on the right lateral ankle and a large eschared pressure area on the right heel. We have been using Iodoflex to the 2 original wounds. 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 It is simply too perilous for her to be seen in any other  way 1/26; TELEHEALTH; the patient was seen today in accompaniment of her husband and our RN one of our nurses. She still has the 3 open areas 1 on the right medial mid tibia which is the remanent of a more extensive wound in this area, one on the right lateral ankle and a large eschared pressure area on the right heel. We have been using Iodoflex to the 2 original wounds and a bed at 9 application to the eschared area on the heel 2/15; the first time we have seen this patient and then several months out of concern for the pandemic. She had a large horizontal wound in the mid tibia. Only the medial aspect of this is still open. Area on the lateral ankle is just about closed. She had a new pressure ulcer on the right heel which I have removed some of the eschar. We have been using Iodoflex which I will continue. The area in the mid tibia has a round circle in the middle of exposed muscle. I think we would have to use an advanced treatment product to stimulate granulation over this area. We will run this through her insurance. She is not eligible for plastic surgery for many reasons 3/1; TELEHEALTH. The patient was seen today by telehealth. 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 We have been using Iodoflex to the wound areas which include the right anterior mid tibia, right lateral ankle and the right heel. All of these were reviewed Allergies Sulfa (Sulfonamide Antibiotics) (Severity: Severe, Reaction: rash), Levaquin (Severity: Severe, Reaction: flu like symptoms), penicillin (Severity: Severe, Reaction: rash) Objective Constitutional Sitting or standing Blood Pressure is within target range for patient.. Pulse regular and within target range for patient.Marland Kitchen Respirations regular, non-labored and within target range.. Temperature is normal and within the target range for the patient.Marland Kitchen Appears in no  distress. Vitals Time Taken: 12:05 PM, Height: 68 in, Weight: 132 lbs, BMI: 20.1, Temperature: 97.9 F, Pulse: 67 bpm, Blood Pressure: 132/62 mmHg. General Notes: vitals obtained by patient General Notes: Wound exam; the area is in the mid tibia has a small area of exposed muscle this looks about the same. ooRight lateral ankle does not have any open area that I can see but I am not sure about the viability of the eschar over the surface ooThe right plantar heel has again black eschar here. I had remove this last time and bit surprised to see this back Integumentary (Hair, Skin) Wound #16 status is Open. Original cause of wound was Pressure Injury. The wound is  located on the Right Calcaneus. The wound measures 1.5cm length x 2.4cm width x 0.1cm depth; 2.827cm^2 area and 0.283cm^3 volume. There is no tunneling or undermining noted. There is a medium amount of serosanguineous drainage noted. The wound margin is flat and intact. There is no granulation within the wound bed. There is a large (67-100%) amount of necrotic tissue within the wound bed including Eschar. Wound #5 status is Open. Original cause of wound was Gradually Appeared. The wound is located on the Right,Medial Lower Leg. The wound measures 1cm length x 1.4cm width x 0.5cm depth; 1.1cm^2 area and 0.55cm^3 volume. There is muscle and Fat Layer (Subcutaneous Tissue) Exposed exposed. There is no tunneling or undermining noted. There is a medium amount of serosanguineous drainage noted. The wound margin is well defined and not attached to the wound base. There is small (1-33%) pink, pale granulation within the wound bed. There is a large (67-100%) amount of necrotic tissue within the wound bed including Adherent Slough. Wound #7 status is Open. Original cause of wound was Gradually Appeared. The wound is located on the Right,Lateral Malleolus. The wound measures 0.1cm length x 0.1cm width x 0.1cm depth; 0.008cm^2 area and 0.001cm^3  volume. There is no tunneling or undermining noted. There is a small amount of serosanguineous drainage noted. The wound margin is well defined and not attached to the wound base. There is large (67-100%) pink, pale granulation within the wound bed. There is no necrotic tissue within the wound bed. 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 Dressing Change Frequency: Wound #16 Right Calcaneus: Change Dressing every other day. Wound #5 Right,Medial Lower Leg: Change Dressing every other day. Wound #7 Right,Lateral Malleolus: Change Dressing every other day. Skin Barriers/Peri-Wound Care: Moisturizing lotion - both legs Wound Cleansing: May shower and wash wound with soap and water. - on days that dressing is changed Primary Wound Dressing: Wound #16 Right Calcaneus: Other: - paint with betadine Wound #5 Right,Medial Lower Leg: Iodoflex Wound #7 Right,Lateral Malleolus: Iodoflex Secondary Dressing: Wound #16 Right Calcaneus: Dry Gauze Heel Cup Wound #5 Right,Medial Lower Leg: Dry Gauze Wound #7 Right,Lateral Malleolus: Dry Gauze Edema Control: Kerlix and Coban - Right Lower Extremity Avoid standing for long periods of time Elevate legs to the level of the heart or above for 30 minutes daily and/or when sitting, a frequency of: - throughout the day Off-Loading: Turn and reposition every 2 hours Other: - float heels off of bed/chair with pillow under calves Home Health: [facility] for wound care. - [facility] 1. I am continue  with the Iodoflex to the right anterior leg and the right ankle 2. I changed him to betadine on the right heel until I can see this in person 3. The patient has been approved for Apligraf. The major reason to use this is the area on the right anterior mid tibia that has exposed muscle we are going to need to see if we can stimulate some granulation over this area. I am going to try to bring her back for application of this to this area. Debridement of the right ankle is likely to be  necessary Electronic Signature(s) Signed: 02/29/2020 5:45:55 PM By: Linton Ham MD Entered By: Linton Ham on 02/29/2020 12:56:08 -------------------------------------------------------------------------------- SuperBill Details Patient Name: Date of Service: [name] [name] 02/29/2020 Medical Record Number:[id number] Patient Account Number: [id number] Date of Birth/Sex: Treating RN: Jan 21, 1951 (69 y.o. 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

## 2020-03-02 ENCOUNTER — Ambulatory Visit: Payer: Medicare Other | Attending: Internal Medicine

## 2020-03-02 DIAGNOSIS — Z23 Encounter for immunization: Secondary | ICD-10-CM

## 2020-03-02 NOTE — Progress Notes (Signed)
   Covid-19 Vaccination Clinic  Name:  [name] [name]    MRN: [id number] DOB: [birth year]  03/02/2020  [name] was observed post Covid-19 immunization for 15 minutes without incident. She was provided with Vaccine Information Sheet and instruction to access the V-Safe system.   [name] was instructed to call 911 with any severe reactions post vaccine: Marland Kitchen Difficulty breathing  . Swelling of face and throat  . A fast heartbeat  . A bad rash all over body  . Dizziness and weakness   Immunizations Administered    Name Date Dose VIS Date Route   Pfizer COVID-19 Vaccine 03/02/2020 11:34 AM 0.3 mL 12/11/2019 Intramuscular   Manufacturer: [place]   Lot: [id number]   [place]: [id number]

## 2020-03-03 ENCOUNTER — Other Ambulatory Visit: Payer: Self-pay | Admitting: *Deleted

## 2020-03-03 DIAGNOSIS — S32592D Other specified fracture of left pubis, subsequent encounter for fracture with routine healing: Secondary | ICD-10-CM | POA: Diagnosis not present

## 2020-03-03 DIAGNOSIS — M349 Systemic sclerosis, unspecified: Secondary | ICD-10-CM | POA: Diagnosis not present

## 2020-03-03 DIAGNOSIS — I739 Peripheral vascular disease, unspecified: Secondary | ICD-10-CM | POA: Diagnosis not present

## 2020-03-03 DIAGNOSIS — S62102D Fracture of unspecified carpal bone, left wrist, subsequent encounter for fracture with routine healing: Secondary | ICD-10-CM | POA: Diagnosis not present

## 2020-03-03 DIAGNOSIS — G629 Polyneuropathy, unspecified: Secondary | ICD-10-CM | POA: Diagnosis not present

## 2020-03-03 DIAGNOSIS — S52592D Other fractures of lower end of left radius, subsequent encounter for closed fracture with routine healing: Secondary | ICD-10-CM | POA: Diagnosis not present

## 2020-03-03 NOTE — Patient Outreach (Signed)
[facility]) Care Management  03/03/2020  [name] [name] May 28, 1951 [id number]    Telephone Assessment-Falls Prevention  RN spoke with pt today and inquired on her ongoing management of care. Pt states no falls and she continue to work with PT with [facility]. Pt states she continues to use her walker, wheelchair and 3-1 commode to prevent these falls in and outside the home. Pt verified she continue to use home O2 with a portable unit for outings. Dr. [name] continue to work with pt as has cardiologist with no reported issues. No additional needs reported today as pt continue to due well with acute issues. Plan of care discussed with some goals met and other updated according to pt's progress. Will reiterated on safety and prevention measures and follow up quarterly as discussed.   Plan: Will update pt's primary provider on pt's disposition with [facility] services at this time. No other inquires or request at this time.   [facility] CM Care Plan Problem One     Most Recent Value  Care Plan Problem One  Recent hospitalization related to Impaired safety-falls  Role Documenting the Problem One  Care Management Telephonic Coordinator  Care Plan for Problem One  Active  [facility]N Long Term Goal   Pt will not sustain fall within the next 90 days.  [facility]N Long Term Goal Start Date  12/03/19  Interventions for Problem One Long Term Goal  Will continue to encourage pt to use her walker with all ambulations and continue to make her home safe with well lite pathways and no throw rugs present to lower her risk for falls. Will continue to encourge safety both inside and outside the home. Will continue to monitor this goal.   [facility] CM Short Term Goal #1   Pt will work with the physical therapist on fall prevention methods over the next 30 days.  [facility] CM Short Term Goal #1 Start Date  12/03/19  Interventions for Short Term Goal #1  Will continue to extend as pt continue to work with the therapy  with [facility] involvement.  [facility] CM Short Term Goal #2   Pt/caregiver will implement strategies to increase safety and prevent falls in the home within the next 30 days.  [facility] CM Short Term Goal #2 Start Date  12/03/19  [facility] CM Short Term Goal #2 Met Date  03/03/20      Raina Mina, RN Care Management Coordinator [place] Office [phone]

## 2020-03-06 ENCOUNTER — Other Ambulatory Visit (HOSPITAL_COMMUNITY): Payer: Self-pay | Admitting: Cardiology

## 2020-03-07 ENCOUNTER — Other Ambulatory Visit: Payer: Self-pay

## 2020-03-07 ENCOUNTER — Encounter (HOSPITAL_BASED_OUTPATIENT_CLINIC_OR_DEPARTMENT_OTHER): Payer: Medicare Other | Admitting: Internal Medicine

## 2020-03-07 DIAGNOSIS — I10 Essential (primary) hypertension: Secondary | ICD-10-CM | POA: Diagnosis not present

## 2020-03-07 DIAGNOSIS — I2721 Secondary pulmonary arterial hypertension: Secondary | ICD-10-CM | POA: Diagnosis not present

## 2020-03-07 DIAGNOSIS — L89616 Pressure-induced deep tissue damage of right heel: Secondary | ICD-10-CM | POA: Diagnosis not present

## 2020-03-07 DIAGNOSIS — I83222 Varicose veins of left lower extremity with both ulcer of calf and inflammation: Secondary | ICD-10-CM | POA: Diagnosis not present

## 2020-03-07 DIAGNOSIS — L97212 Non-pressure chronic ulcer of right calf with fat layer exposed: Secondary | ICD-10-CM | POA: Diagnosis not present

## 2020-03-07 DIAGNOSIS — I87311 Chronic venous hypertension (idiopathic) with ulcer of right lower extremity: Secondary | ICD-10-CM | POA: Diagnosis not present

## 2020-03-07 DIAGNOSIS — L97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: Secondary | ICD-10-CM | POA: Diagnosis not present

## 2020-03-07 DIAGNOSIS — L97811 Non-pressure chronic ulcer of other part of right lower leg limited to breakdown of skin: Secondary | ICD-10-CM | POA: Diagnosis not present

## 2020-03-07 NOTE — Progress Notes (Signed)
[name] [name], [name] [name] ([id number]) Visit Report for 03/07/2020 Cellular or Tissue Based Product Details Patient Name: Date of Service: [name] [name], [name] [name] 03/07/2020 9:00 AM Medical Record Number:[id number] Patient Account Number: [id number] Date of Birth/Sex: Treating RN: 08/14/1951 (69 y.o. [name] Primary Care Provider: [name] Other Clinician: Referring Provider: Treating Provider/Extender:Ruth Tully, Leotis Shames, MARY Weeks in Treatment: [phone] Cellular or Tissue Based Wound #5 Right,Medial Lower Leg Product Type Applied to: Performed By: Physician Ricard Dillon., MD Cellular or Tissue Based Apligraf Product Type: Level of Consciousness (Pre- Awake and Alert procedure): Pre-procedure Yes - 09:58 Verification/Time Out Taken: Location: trunk / arms / legs Wound Size (sq cm): 3 Product Size (sq cm): 44 Waste Size (sq cm): 24 Waste Reason: wound size Amount of Product Applied (sq cm): 20 Instrument Used: Forceps Lot #: [id number] Order #: 1 Expiration Date: 03/15/2020 Fenestrated: Yes Instrument: Blade Reconstituted: Yes Solution Type: normal saline Solution Amount: 10 ml Lot #: [id number] Solution Expiration Date: 08/30/2021 Secured: Yes Secured With: Steri-Strips Dressing Applied: Yes Primary Dressing: gauze Procedural Pain: 0 Post Procedural Pain: 0 Response to Treatment: Procedure was tolerated well Level of Consciousness Awake and Alert (Post-procedure): Post Procedure Diagnosis Same as Pre-procedure Electronic Signature(s) Signed: 03/07/2020 5:29:15 PM By: Linton Ham MD Entered By: Linton Ham on 03/07/2020 10:15:24 -------------------------------------------------------------------------------- Debridement Details Patient Name: Date of Service: [name] [name] 03/07/2020 9:00 AM Medical Record Number:[id number] Patient Account Number: [id number] Date of Birth/Sex: Treating RN: April 11, 1951 (69 y.o. [name] Primary Care Provider: [name] Other Clinician: Referring Provider: Treating Provider/Extender:Lundynn Cohoon, Leotis Shames, MARY Weeks in Treatment: [phone] Debridement Performed for Wound #5 Right,Medial Lower Leg Assessment: Performed By: Physician Ricard Dillon., MD Debridement Type: Debridement Severity of Tissue Pre Other severity specified Debridement: Level of Consciousness (Pre- Awake and Alert procedure): Pre-procedure Verification/Time Out Taken: Yes - 09:50 Start Time: 09:50 Total Area Debrided (L x W): 2 (cm) x 1.5 (cm) = 3 (cm) Tissue and other material Viable, Non-Viable, Slough, Subcutaneous, Slough debrided: Level: Skin/Subcutaneous Tissue Debridement Description: Excisional Instrument: Curette Bleeding: Minimum Hemostasis Achieved: Pressure End Time: 09:52 Procedural Pain: 0 Post Procedural Pain: 0 Response to Treatment: Procedure was tolerated well Level of Consciousness Awake and Alert (Post-procedure): Post Debridement Measurements of Total Wound Length: (cm) 2 Width: (cm) 1.5 Depth: (cm) 0.3 Volume: (cm) 0.707 Character of Wound/Ulcer Post Improved Debridement: Severity of Tissue Post Debridement: Fat layer exposed Post Procedure Diagnosis Same as Pre-procedure Electronic Signature(s) Signed: 03/07/2020 5:29:15 PM By: Linton Ham MD Signed: 03/07/2020 5:59:01 PM By: Levan Hurst RN, BSN Entered By: Linton Ham on 03/07/2020 10:15:34 -------------------------------------------------------------------------------- HPI Details Patient Name: Date of Service: [name] [name] 03/07/2020 9:00 AM Medical Record Number:[id number] Patient Account Number: [id number] Date of Birth/Sex: Treating RN: 05-27-1951 (69 y.o. [name] Primary Care Provider: [name] Other Clinician: Referring Provider: Treating Provider/Extender:Naiomy Watters, Leotis Shames, MARY Weeks in Treatment: 61 History of Present Illness HPI Description: this is a patient who initially came to [place]  for wounds on the medial malleoli bilaterally as well as her upper medial lower extremities bilaterally. These wounds eventually healed with assistance of Apligraf's bilaterally. While this was occurring she developed the current wound which opened into a fairly substantial wound on the right lower extremity. These are mostly secondary to venous stasis physiology however the patient also has underlying scleroderma, pulmonary hypertension. The wound has been making good progress lately with the Hydrofera Blue-based dressing. 03/17/2015; patient had a arterial evaluation a year ago. Her right ABI was 0.86  left was 1.0. Toe brachial index was 0.41 on the right 0.45 on the left. Her bilateral common femoral artery waveforms were triphasic. Her white popliteal posterior tibial artery and anterior tibial artery waveforms were monophasic with good amplitude. Luteal artery waveforms were biphasic it was felt that her bilateral great toe pressures are of normal although adequate for tissue healing. 03/24/2015. The condition of this wound is not really improved that. He was covered with as fibrinous surface slough and eschar. This underwent an aggressive debridement with both a curette and scalpel. I still cannot really get down to what I can would consider to be a viable surface. There is no evidence of infection. Previous workup for ischemia roughly a year ago was negative nevertheless I think that continues to be a concern 04/07/15. The patient arrived for application of second Apligraf. Once again the surface of this wound is certainly less viable than I would like for an advanced treatment option. An aggressive debridement was done. She developed some arteriolar bleeding which required that along pressure and silver alginate. 04/21/15: change in the condition of this wound. Once again it is covered in a gelatinous surface slough. After debridement today surface of the wound looks somewhat better but now a  heeling surface 05/05/15 Apligraf #4 applied.Still a lot of slough on this wound. 05/19/15 Apligraf #5 applied. Still a lot of slough on this wound I did not aggressively remove this 06/02/15; continued copious amounts of surface slough. This debridement fairly easily. 06/08/2015 -- the last time she had a venous duplex study done was over 3 years ago and the surgery was prior to that. I have recommended that she sees Dr. early for a another opinion regarding a repeat venous duplex and possibly more endovenous ablation of vein stripping of micro-phlebotomies. 06/16/15; wound has a gelatinous surface eschar that the debridement fairly easily to a point. I don't disagree with the venous workup and perhaps even arterial re-evaluation. She is on prednisone 5 mg and continue his medications for her pulmonary artery hypertension I am not sure if the latter have any wound care healing issues I would need to investigate this. 06/23/15 continues with a gelatinous surface eschar with of fibrinous underlying. What I can see of this wound does not look unhealthy however I just can't get this material which I think is 2 different layers off. Empiric culture done 06/30/15; unfortunately not a lot of change in this wound. A gelatinous surface eschar is easily removed however it has a tight fibrinous surface underneath the. 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 The wound comes back and basically and unchanged state the. She has a gelatinous surface that is more easily removed however there is a tightly fibrinous surface underneath the. There is no evidence of infection. She has a vascular follow-up next month. I would have to inject her in order to do a more aggressive debridement of this area 07/21/15 the wound is roughly in the same state albeit the debridement is done with greater ease. There is less of the fibrinous eschar underneath the. There is no evidence of infection. She has follow-up with  vascular surgery next week. No evidence of surrounding infection. Her original distal wounds healed while this one formed. 07/28/15; wound is easier to debride. No wound erythema. She is seeing Dr. [name] next week. 08/18/15 Has been to [place] for repeat ablation. Have been using medihoney pad with some improvement 08/25/15; absolutely no change in the  condition of this wound in either its overall size or surface condition in many months now. At one point I had this down to [place] healthier surface I think with [facility] however this did not actually progress towards closure. Do not believe that the wound has actually deteriorated in terms of volume at all. We have been using a medihoney pad which allows easier debridement of the gelatinous eschar but again no overall actual improvement. the patient is going towards an ablation with pain and vascular which I think is scheduled for next week. The only other investigation that I could first see would be a biopsy. She does have underlying scleroderma 09/02/15 eschar is much easier to debridement however the base of this does not look particularly vibrant. We changed to Iodoflex. The patient had her ablation earlier this week 09/09/15 again the debridement over the base of this wound is easier and the base of the wound looks considerably better. We will continue the Iodoflex. Dr. Tawni Millers has expressed his satisfaction with the result of her ablation 09/15/15 once again the wound is relatively free of surface eschar. No debridement was done today. It has a pale- looking base to it. although this is not as deep as it once was it seems to be expanding especially inferiorly. She has had recent venous ablation but this is no closer to healing.I've gone ahead and done a punch biopsy this from the inferior part of the wound close to normal skin 09/22/15: the wound is relatively free of surface eschar. There is some surrounding eschar. I'm not exactly sure  at what level the surface is that I am seeing. 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 No evidence of atypical infection, malignancy. 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 No debridement was done 09/29/15; the wound surface is relatively free of surface eschar. There is eschar surrounding the walls of the wound. Aside from the improvement in the amount of surface slough. This wound has not progressed any towards closure. There is not even a surface that looks like there at this is ready. There is no evidence of any infection nor maligancy based on biopsy I did on 9/15. I continued with the Iodoflex however I am looking towards some alternative to try to promote some closure or filling in of this surface. Consider triple layer Oasis. Collagen did not result in adequate control of the surface slough 10/13/15; the patient was in hospital last week with severe anemia. The wound looks somewhat better after debridement although there is widening medially. There is no evidence of infection. 10/20/15; patient's wound on the right lateral lower leg is essentially unchanged. This underwent a light surface debridement and in general the debridement is easier and the surface looks improved. I noted in doing this on the side of the wound what appears to be a piece of calcium deposition. The patient noted that she had previous things on the tips of her fingers. 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 She follows with rheumatology and I have asked them to talk to her about this. In view of that S the nonhealing ulcer may have something to do with calcinosis and also unrelieved Raynaud's disease in this area. I should note that her original wounds on the right and left medial malleolus and the inner aspects of both legs just below the knees did however heal over 10/30/15; the  patient's wound on  the right lateral lower leg is essentially unchanged. I was able to remove some calcified material from the medial wound edge. 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 Otherwise the wound appears essentially unchanged there is less adherent surface eschar. Some of the calcified material was sent to pathology for analysis 11/17/15. The patient's wound on the right lateral leg is essentially unchanged. Wider Medially. 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 There is some surface slough but I did not debridement this. No evidence of surrounding infection. 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 This will make healing difficult 12/15/15; the wound has less depth surface has a fibrinous slough and calcifications in the wound edge no evidence of infection 12/22/15; the wound definitely has less Fibrinous slough on the base. Calcifications around the wound edges are still evident. Although the wound bed looks healthier it is still pale in appearance. Previous biopsy did not show malignancy 01/04/15; surgical debridement of nonviable slough and subcutaneous tissue the wound cleans up quite nicely but appears to be expanding outward calcifications around the wound edges are still there. 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 She is to see her rheumatologist I'll see if he has any opinions on this. My punch biopsy done in September did not show calcifications although these are clearly evident. 01/19/16 light selective debridement of nonviable surface slough. There is epithelialization medially. This gives  me reason for cautious optimism. She has been to see her rheumatologist, there is nothing that can be done for this type of soft tissue calcification associated with scleroderma 02/02/16 no debridement although there is a light surface slough. She has 2 peninsulas of skin 1 inferiorly and one medially. We continue to make a slight and slow but definite progressive here 02/16/16; light surface debridement with more attention to the circumference of the wound bed where the fibrinous eschar is more prevalent. No calcifications detected. She seems to have done nicely with the [facility] with some epithelialization and some improvement in the overall wound volume. 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 Continued attention to the circumference of the wound where the fibrinous eschar in calcinosis or prevalent. No calcifications were detected. She has continued improvement with Hydrofera Blue. The wound is no longer as deep 03/15/16 surgical debridement done to remove surface escha Especially around the circumference of the wound where there is nonviable subcutaneous tissue. In spite of this there is considerable improvement in the overall dimensions and depth of the wound. Islands of epithelialization are seen especially medially inferiorly and superiorly to a lesser extent. She is using Hydrofera Blue at home 03/29/16; surgical debridement done to remove surface eschar and nonviable subcutaneous tissue. This cleans up quite nicely mention slightly larger in terms of length and width however depth is less 04/12/16; continued gradual improvement in terms of depth and the condition of the wound base. Debridement is done. Continuing long standing Hydrofera Blue at home with Kerlix Coban wraps 04/26/16; continued gradual improvement in terms of depth and management as well as  condition of the wound base. Surgical debridement  done she continues with Hydrofera Blue. 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 She initially came to this clinic venous insufficiency ulcers which have long since healed 05/17/16 continued improvement in terms of the depth and measurements of this wound although she has a tightly adherent fibrinous slough each time. We've been continued with long standing Hydrofera Blue which seems to done as well for this wound is many advanced treatment options. 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 She also has chronic venous insufficiency. She has an irritation on her lateral right ankle secondary to our wraps 05/10/16; wound appears to be smaller especially on the medial aspect and especially in the width. Wound was debridement surface looks better. She is also been in the hospital apparently with anemia again she tells me she had an endoscopy. Since she got home after 3 days which I believe was sometime last week she has had an irritated painful area on the right lateral ankle surrounding the lateral malleolus 05/31/16; much more adherent surface slough today then recently although I don't think the dimensions of changed that much. A more aggressive debridement is required. The irritated area over her medial malleolus is more pruritic and painful and I don't think represents cellulitis 06/14/16; no major change in her wound dimensions however there is more tightly adherent surface slough which is disappointing. As well as she appears to have a new small area medially. Furthermore an irritated uncomfortable area on the lateral aspect of her right foot just below the lateral malleolus. 06/21/16; I'm seeing the patient back in follow-up for the new areas under her major wound on the right anterior leg. She has been using Hydrofera Blue to this area probably for several months now and  although the dressings seem to be helping for quite a period of time I think things have stagnated lately. She comes in today with a relatively tight adherent surface slough and really no changes in the wound shape or dimension. The 2 small areas she had inferiorly are tiny but still open they seem improved this well. There is no uncontrolled edema and I don't think there is any evidence of cellulitis. 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 Patient has had vascular evaluations both venous and arterial. I have biopsied this area. There is no obvious infection. The worrisome thing today is that she seems to be developing areas of erythema and epithelial damage on the medial aspect of the right foot. Also to a lesser degree inferior to the actual wound itself. Again I see no obvious changes to suggest cellulitis however as this is the only treatable option I will probably give her antibiotics. 07/13/16 no major change in the lady's large anterior right leg wound. Still covered with a very tightly adherent surface slough which is difficult to debridement. There is less erythema around this, culture last week grew pseudomonas I gave her ciprofloxacin. The area on her lateral right malleolus looks better- 08/02/16 the patient's wounds continued to decline. Her original large anterior right leg wound looks deeper. Still adherent surface slough that is difficult to debridement. She has a small area just below this and a punched-out wound just below her lateral malleolus. In the meantime she is been in hospital with apparently an upper GI bleed on Plavix and aspirin. She is now just on Plavix  she received 3 units of packed cells 08/23/16; since I last saw this 3 weeks ago, the open large area on her right leg looks about the same syrup. She has a small satellite lesion just underneath this. The area  on her medial right ankle is now a deep necrotic wound. I attempted to debridement this however there is just too much pain. 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 She follows with vascular surgery and is scheduled for an angiogram in early September 09/06/16; the patient is going for an arteriogram tomorrow. Her original large wound on the right calf is about the same the satellite lesion underneath it is about the same however the area on her medial ankle is now deeper with exposed tendon. I am no longer attempting to debride these wounds 09/20/16; the patient has undergone a right femoral endarterectomy and Dacron patch angioplasty. This seems to have helped the flow in her right leg. 10/04/16; Arrived today for aggressive debridement of the wounds on her right calf the original wound the one beneath it and a difficult area over her right lateral leg just above the lateral malleolus. 10/25/16; her 3 open wounds are about the same in terms of dimensions however the surface appears a lot healthier post debridement. Using Iodoflex 10/18/16 we have been using Iodoflex to her wounds which she tolerated with some difficulty. 10/11/16; has been using Santyl for a period of time with some improvement although again very adherent surface slough would prevent any attempted healing this. She has a original wound on the left calf, the satellite underneath that and the most recent wound on the right medial ankle. She has completed revascularization by Dr. early and has had venous ablation earlier. Want to go back to Iodoflex to see if week and get a healthier surface to this wound bed failing this I think she'll need to be taken to the OR and I am prepared to call Dr. Marla Roe to discuss this. She is obviously not a good candidate for general anesthesia however.; 11/08/16; I put her on Iodoflex last time to see if I can get the wound  bed any healthier and unfortunately today still had tremendous surface slough. 11/15/16; 4 weeks' worth of Iodoflex with not much improvement. Debridement on the major wounds on her left anterior leg is easier however this does not maintain from week to week. The punched-out area on her medial right ankle 11/29/16; I attempted to change the patient last visit to [facility] however she states this burned and was very uncomfortable therefore we gave her permission to go back to the eye out of complex which she already had at home. Also she noted a lot of pain and swelling on the lateral aspect of her leg before she traveled to [facility] for the holidays, I called her in doxycycline over the phone. This seems to have helped 12/06/16; Wounds unchanged by in large. Using Iodoflex 12/13/16; her wounds today actually looks somewhat better. The area on the right lateral lower leg has reasonably healthy-looking granulation and perhaps as actually filled and a bit. Debridement of the 2 wounds on the medial calf is easier and post debridement appears to have a healthier base. We have referred her to Dr. Migdalia Dk for consideration of operative debridement 12/20/16; we have a quick note from Dr. Merri Ray who feels that the patient needs to be referred to an academic center/plastic surgeon. This  is due to the complexity of the patient's medical issues as it applies to anything in the OR. We have been using Iodoflex 12/27/16; in general the wounds on the right leg are better in terms of the difficult to remove surface slough. She has been using Iodoflex. She is approved for Apligraf which I anticipated ordering in the next week or 2 when we get a better-looking surface 01/04/16 the deep wounds on the right leg generally look better. Both of them are debrided further surface slough. The area on the lateral right leg was not debrided. She is approved for Apligraf I think I'll probably  order this either next week or the week after depending on the surface of the wounds superiorly. We have been using Iodoflex which will continue until then 01/11/16; the deep wounds on the right leg again have a surface slough that requires debridement. I've not been able to get the wound bed on either one of these wounds down to what would be acceptable for an advanced skin stab- like Apligraf. The area on the medial leg has been improving. We have been using hot Iodoflex to all wounds which seems to do the best at at least limiting the nonviable surface 01/24/17; we have continued Iodoflex and all her wound areas. Her debridement Gen. he is easier and the surface underneath this looks viable. Nevertheless these are large area wounds with exposed muscle at least on the anterior parts. We have ordered Apligraf's for 2 weeks from now. The patient will be away next week 02/07/17; the patient was close to have first Apligraf today however we did not order one. I therefore replaced her Iodoflex. She essentially has 3 large punched-out areas on her right anterior leg and right medial ankle. 02/11/17; Apligraf #1 02/25/17; Apligraf #2. In general some improvement in the right medial ankle and right anterior leg wounds. The larger wound medially perhaps some better 03/11/17; Apligraf #3. In general continued improvement in the right medial ankle and the right anterior leg wounds. 03/25/17 Apligraf #4. In general continued improvement especially on the right medial ankle and the lower 04/08/17; Apligraf #5 in general continued improvement in all wound sites. 04/22/17; post Apligraf #5 her wound beds continued to look a lot better all of them up to the surface of the surrounding skin. Had a caramel-colored slough that I did not debrided in case there is residual Apligraf effect. The wounds are as good as I have seen these looking quite some time. 04/29/17; we applied Scammon and last week after completing her  Apligraf. Wounds look as though they've contracted somewhat although they have a nonviable surface which was problematic in the past. Apparently she has been approved for further Apligraf's. We applied Iodosorb today after debridement. 05/06/17; we're fortunate enough today to be able to apply additional Apligraf approved by her insurance. In general all of her wounds look better Apligraf #6 05/24/17; Apligraf #7 continued improvement in all wounds 3 06/10/17 Apligraf #8. Continued improvement in the surface of all wounds. Not much of an improvement in dimensions as I might a follow 06/24/17 Apligraf #9 continued general improvement although not as much change in the wound areas I might of like. She has a new open area on the right anterior lateral ankle very small and superficial. She also has a necrotic wound on the tip of her right index finger probably secondary to severe uncontrolled Raynaud's phenomenon. She is already on sildenafil and already seen her rheumatologist who gave her  Keflex. 07/08/17; Apligraf #10. Generalized improvement although she has a small additional wound just medial to the major wound area. 07/22/17; after discussion we decided not to reorder any further Apligraf's although there is been considerable improvement with these it hasn't been recently. The major wound anteriorly looks better. Smaller wounds beneath this and the more recent one and laterally look about the same. The area on the right lateral lower leg looks about the same 07/29/17 this is a patient who is exceedingly complex. She has advanced scleroderma, crest syndrome including calcinosis, PAD status post revascularization, chronic venous insufficiency status post ablations. She initially presented to this clinic with wounds on her bilateral lower legs however these closed. More recently we have been dealing with a large open area superiorly on the right anterior leg, a smaller wound underneath this and then  another one on the right medial lower extremity. These improve significantly with 10 Apligraf applications. Over the last 2-3 weeks we are making good progress with Hydrofera Blue and these seem to be making progress towards closure 08/19/17; wounds continued to make good improvement with Hydrofera Blue and episodic aggressive debridements 08/26/17; still using Hydrofera Blue. Good improvements 09/09/17; using Hydrofera Blue continued improvement. Area on the lateral part of her right leg has only a small remaining open area. The small inferior satellite region is for all intents and purposes closed. Her major wound also is come in in terms of depth and has advancing epithelialization. 09/16/17; using Hydrofera Blue with continued improvement. The smaller satellite wound. We've closed out today along with a new were satellite wound medially. The area on her medial ankle is still open and her major wound is still open but making improvement. All using Hydrofera Blue. Currently 09/30/17; using Hydrofera Blue. Still a small open area on the lateral right ankle area and her original major wound seems to be making gradual and steady improvement. 10/14/17; still using Hydrofera Blue. Still too small open areas on the right lateral ankle. Her original major wound is horizontal and linear. The most problematic area paradoxically seems to be the area to the medial area wears I thought it would be the lateral. 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 10/28/17; still using Hydrofera Blue. Right lateral ankle has a very small open area superiorly on the most lateral part of the wound. Her original open wound has 2 open areas now separated by normal skin and we've redefined this. 11/11/17; still using Hydrofera Blue area and right lateral ankle continues to have a small open area on mostly the lateral part of the wound. Her original wound has 2 small open areas  now separated by a considerable amount of normal skin 11/28/17; the patient called in slightly before Thanksgiving to report pain and erythema above the wound on the right leg. In the past this is responded well to treatment for cellulitis and I gave her over the phone doxycycline. She stated this resulted in fairly abrupt improvement. We have been using Hydrofera Blue for a prolonged period of time to the larger wound anteriorly into the remaining wound on her right right lateral ankle. The latter is just about closed with only a small linear area and the bottom of the [place]. 12/02/17; use endoform and left the dressing on since last visit. There is no tenderness and no evidence of infection. 12/16/17; patient has been using Endoform but not making much progress. The 2 punched out open areas anteriorly which  were the reminiscence of her major wound appear deeper. The area on the lateral aspect of her right calf also appears deeper. Also she has a puzzling tender swelling above her wound on the right leg. This seems larger than last time. Just above her wounds there appears to be some fluctuance in this area it is not erythematous and there is no crepitus 12/30/17; patient has been using Endoform up until last week we used Hydrofera Blue. Ultrasound of the swelling above her 2 major wounds last time was negative for a fluid collection. I gave her cefaclor for the erythema and tenderness in this area which seems better. Unfortunately both punched out areas anteriorly and the area on her right medial lower leg appear deeper. In fact the lateral of the wounds anteriorly actually looks as though it has exposed tendon and/or muscle sheath. She is not systemically unwell. She is complaining of vaginitis type symptoms presumably Candida from her antibiotics. 01/06/18; we're using santyl. she has 2 punched out areas anteriorly which were initially part of a large wound. Unfortunately medially this is now  open to tendon/muscle. All the wounds have the same adherent very difficult to debride surface. 01/20/18; 2 week follow-up using Santyl. She has the 2 punched out areas anteriorly which were initially part of her large surface wound there. Medially this still has exposed muscle. All of these have the same tightly adherent necrotic surface which requires debridement. PuraPly was not accepted by the patient's insurance however her insurance I think it changed therefore we are going to run Apligraf to gain 02/03/18; the patient has been using Santyl. The wound on the right lateral ankle looks improved and the 2 areas anteriorly on the right leg looks about the same. The medial one has exposed muscle. The lateral 1 requiresdebridement. We use PuraPly today for the first week 02/10/18; PuraPly #2. The patient has 3 wounds. The area on the right lateral ankle, 2 areas anteriorly that were part of her original large wound in this area the medial one has exposed muscle. All of the wounds were lightly debrided with a number 3 curet. PuraPly #2 applied the lateral wound on the calf and the right lateral ankle look better. 02/17/18; PuraPly #3. Patient has 3 wounds. The area on the right lateral ankle in 2 areas internally that were part of her original large wound. The lateral area has exposed muscle. She arrived with some complaints of pain around the right ankle. 02/24/18; PuraPly #4; not much change in any of the 3 wound areas. Right lateral ankle, right lateral calf. Both of these required debridement with a #3 curet. She tolerates this marginally. The area on the medial leg still has exposed muscle. Not much change in dimensions 03/03/18 PuraPly #5. The area on the medial ankle actually looks better however the 2 separate areas that were original parts of the larger right anterior leg wound look as though they're attempting to coalesce. 03/10/18; PuraPly #6. The area on the medial ankle actually continues to  look and measures smaller however the 2 separate areas that were part of the original large wound on the right anterior leg have now coalesced. There hasn't been much improvement here. The lateral area actually has underlying exposed muscle 03/17/18-she is here in follow-up evaluation for ulcerations to the right lower extremity. She is voicing no complaints or concerns. She tolerated debridement. Puraply#7 placed 03/24/18; difficult right lower extremity ulcerations. PuraPly #8 place. She is been approved for [employer]. She did very  well with Apligraf today however she is apparently reached her "lifetime max" 03/31/18; marginal improvement with PuraPly although her wounds looked as good as they have in several weeks today. Used TheraSkins #1 04/14/18 TheraSkin #2 today 04/28/18 TheraSkins #3. Wound slightly improved 05/12/18; TheraSkin skin #4. Wound response has been variable. 05/27/18 TheraSkin #5. Generally improvement in all wound areas. I've also put her in 3 layer compression to help with the severe venous hypertension 06/09/18; patient is done quite well with the TheraSkins unfortunately we have no further applications. I also put her in 3 layer compression last week and that really seems to of helped. 06/16/18; we have been using silver collagen. Wounds are smaller. Still be open area to the muscle layer of her calf however even that is contracted somewhat. She tells me that at night sometimes she has pain on the right lateral calf at the site of her lower wound. Notable that I put her into 3 layer compression about 3 weeks ago. She states that she dangles her leg over the bed that makes it feel better but she does not describe claudication during the day She is going to call her secondary insurance to see if they will continue to cover advanced treatment products I have reviewed her arterial studies from 01/22/17; this showed an ABI in the right of 1 and on the left noncompressible. TBI on the  right at 0.30 on the left at 0.34. It is therefore possible she has significant PAD with medial calcification falsely elevating her ABI into the normal range. I'll need to be careful about asking her about this next week it's possible the 3 layer compression is too much 06/23/18; was able to reapply TheraSkin 1 today. Edema control is good and she is not complaining of pain no claudication 07/07/18;no major change. New wound which was apparently a taper removal injury today in our clinic between her 2 wounds on the right calf TheraSkins #2 07/14/18; I think there is some improvement in the right lateral ankle and the medial part of her wound. There is still exposed muscle medially. 07/28/18; two-week follow-up. TheraSkins#3. Unfortunately no major change. She is not a candidate I don't think for skin grafting due to severe venous hypertension associated with her scleroderma and pulmonary hypertension 08/11/18 Patient is here today for her Theraskin application #25 (#5 of the second set). She seems to be doing well and in the base of the wound appears to show some progress at this point. This is the last approved Theraskin of the second set. 08/25/18; she has completed TheraSkin. There has been some improvement on the right lateral calf wound as well as the anterior leg wounds. The open area to muscle medially on the anterior leg wound is smaller. I'm going to transition her back to [facility] under Kerlix Coban change every second day. She reports that she had some calcification removed from her right upper arm. We have had previous problems with calcifications in her wounds on her legs but that has not happened recently 09/08/18;using Hydrofera Blue on both her wound areas. Wounds seem to of contracted nicely. She uses Kerlix Coban wrap and changes at home herself 09/22/2018; using Hydrofera Blue on both her wound areas. Dimensions seem to have come down somewhat. There is certainly less depth in  the medial part of the mid tibia wound and I do not think there is any exposed muscle at this point. 10/06/2018; 2-week follow-up. Using [facility] on her wound areas. Dimensions have  come down nicely both on the right lateral ankle area in the right mid tibial area. She has no new complaints 10/20/2018; 2-week follow-up. She is using Hydrofera Blue. Not much change from the last time she was here. The area on the lateral ankle has less depth although it has raised edges on one side. I attempted to remove as much of the raised edge as I could without creating more additional wounds. The area on the right anterior mid tibia area looks the same. 11/03/2018; 2-week follow-up she is using Hydrofera Blue. On the right anterior leg she now has 2 wounds separated by a large area of normal skin. The area on the medial part still has I think exposed muscle although this area itself is a lot smaller. The area laterally has some depth. Both areas with necrotic debris. The area on her right lateral ankle has come in nicely 11/17/2018; patient continues to use Hydrofera Blue. We have been increasing separation of the 2 wounds anteriorly which were at one point joint the area on the right lateral calf continues to have I think some improvement in depth. 12/08/2018; patient continues to use Hydrofera Blue. There is some improvement in the area on the right lateral calf. The 2 areas that were initially part of the original large wound in the mid right tibia are probably about the same. In fact the medial area is probably somewhat larger. We will run puraply through the patient's insurance 12/22/2018; she has been using Hydrofera Blue. We have small wounds on the right lateral calf and 2 small areas that were initially part of a large wound in the right mid tibia. We applied pure apply #1 today. 12/29/2018; we applied puraply #2. Her wounds look somewhat better especially on the right lateral calf and  the lateral part of her original wound in the mid tibial area. 01/05/2019; perhaps slightly improved in terms of wound bed condition but certainly not as much improvement as I might of liked. Puraply #3 1/13: we did not have a correct sized puraply to apply. wounds more pinched out looking, I increased her compression to 3 layer last week to help with significant multilevel venous hypertension. Since then I've reviewed her arterial status. She has a right femoral endarterectomy and a distal left SFA stent. She was being followed by Dr. [name] for a period however she does not appear to have seen him in 3 years. I will set up an appointment. 1/20. The patient has an additional wound on the right lateral calf between the distal wound and proximal wounds. We did not have Puraply last week. Still does not have a follow-up with Dr. early 1/27: Follow-up with Dr. early has been arranged apparently with follow-up noninvasive studies. Wounds are measuring roughly the same although they certainly look smaller 2/3; the patient had non invasive studies. Her ABIs on the right were 0.83 and on the left 1.02 however there was no great toe pressure bilaterally. Also worrisome monophasic waveforms at the PTA and dorsalis pedis. We are still using Puraply. We have had some improvement in all of the wounds especially the lateral part of the mid tibial area. 2/10; sees Dr. early of vein and vascular re-arterial studies next week. Puraply reapplied today. 2/17; Dr. early of vein and vascular his appointment is tomorrow. Puraply reapplied after debridement of all wounds 2/24; the patient saw Dr. early I reviewed his note. sHe noted the previous right femoral endarterectomy with a Dacron patch. He also noted the  normal ABI and the monophasic waveforms suggesting tibial disease. Overall he did not feel that she had any evidence of arterial insufficiency that would impair her wound healing. He did note her venous disease  as well. He suggested PRN follow-up. 3/2; I had the last puraply applied today. The original wounds over the mid tibia area are improved where is the area on the right lower leg is not 3/9; wounds are smaller especially in the right mid tibia perhaps slightly in the right lateral calf. We finished with puraply and went to endoform today 3/23; the patient arrives after 2 weeks. She has been using endoform. I think all of her wounds look slightly better which includes the area on the right lateral calf just above the right lateral malleolus and the 2 in the right mid tibia which were initially part of the same wound. 4/27; TELEHEALTH visit; the patient was seen for telehealth visit today with her consent in the middle of the worldwide epidemic. Since she was last here she called in for antibiotics with pain and tenderness around the area on the right medial ankle. I gave her empiric doxycycline. She states this feels better. She is using endoform on both of these areas 5/11 TELEHEALTH; the patient was seen for telehealth visit today. She was accompanied at home by her husband. She has severe pulmonary hypertension accompanied scleroderma and in the face of the Covid epidemic cannot be safely brought into our clinic. We have been using endoform on her wound areas. There are essentially 3 wound areas now in the left mid tibia now 2 open areas that it one point were connected and one on the right lateral ankle just above the malleolus. 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 She cannot come to clinic. The wounds were reviewed today via telehealth. She has severe chronic venous hypertension which I think is centrally mediated. She has wounds on her right anterior tibia and right lateral ankle area. These are chronic. She  has been using endoform. 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 We have been following her from telehealth. She has severe chronic venous hypertension which may be mostly centrally mediated secondary to right heart heart failure. She has wounds on her right anterior tibia and right lateral ankle these are chronic we have been using endoform 6/22; TELEHEALTH; this patient was seen today via telehealth. 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 Since the last time we had contact with this patient she developed some pain and erythema around the wound on her right lateral malleolus/ankle and we put in antibiotics for her. This is resulted in good improvement with resolution of the erythema and tenderness. I changed her to silver alginate last time. We had been using endoform for an extended period of time 7/13; TELEHEALTH; this patient was seen today via telehealth. 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 She would continue to be at a prohibitive risk to be brought into our clinic unless this was absolutely necessary. These visits have been done with her approval as well as her husband. We have been using silver alginate  to the areas on the right mid tibia and right lateral lower leg. 7/27 TELEHEALTH; patient was seen along with her husband today via telehealth. 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 We changed her to sample last visit. She has 2 areas a chronic wound on her right mid tibia and one just above her ankle. These were not the original wounds when she came into this clinic but she developed them during treatment 8/17; she comes in  for her first face-to-face visit in a long period. She has a remaining area just medial to the right tibia which is the last open part of her large wound across this area. She also has an area on the right lateral lower leg. We prescribed Santyl last telehealth visit but they were concerned that this was making a deeper so they put silver alginate on it last week. Her husband changes the dressings. 8/31; using Santyl to the 2 wound areas some improvement in wound surfaces. Husband has surgery in 2 weeks we will put her out 3 weeks. Any of the advanced treatment options that I can think of that would be eligible for this wound would also cause her to have to come in weekly. The risk that the patient is just too high 9/21. Using Santyl to the 2 wound areas. Both of these are somewhat better although the medial mid tibia area still has exposed muscle. Lateral ankle requiring debridement. Using Santyl 10/12; using Santyl to the 2 wound areas. One on the right lateral ankle and the other in the medial calf which still has exposed muscle. Both areas have come down in size and have better looking surfaces. She has made nice progress with santyl 11/2; we have been using Santyl to the 2 wound areas. Right lateral ankle and the other in the mid tibia area the medial part of this still has exposed muscle. 11/23/2019 on evaluation today patient appears to be doing about the same really with regard to her wounds. She is actually not very pleased with how things seem to be progressing at this point she tells me that she really has not noted much improvement unfortunately. With that being said there is no signs of active infection at this time. There is some slough buildup noted at this time which again along with some dry skin around the edges of the wound I think would benefit her to try to debride some of this away. Fortunately her pain is doing fairly well. She still has exposed muscle in the right  medial/tibial area. 12/14; TELEHEALTH; she was changed to [facility] to the right calf wound and right lateral ankle wound when she was here last time. Unfortunately since then she had a fall with a pelvic fracture and a fracture of her wrist. She was apparently hospitalized for 5 days. I have not looked at her discharge summary. She apparently came out of the hospital with a blister on her right heel. She was seen today via telehealth by myself and our case manager. The patient and her husband were present. She has been using Hydrofera Blue at our direction from the last time she was in the clinic. There is been no major improvement in fact the areas appear deeper and with a less viable surface 01/04/2020; TELEHEALTH; the patient was seen today in accompaniment of her husband and our nurse. She has 3 open areas 1 on the right  medial mid tibia, one on the right lateral ankle and a large eschared pressure area on the right heel. We have been using Iodoflex to the 2 original wounds. 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 It is simply too perilous for her to be seen in any other way 1/26; TELEHEALTH; the patient was seen today in accompaniment of her husband and our RN one of our nurses. She still has the 3 open areas 1 on the right medial mid tibia which is the remanent of a more extensive wound in this area, one on the right lateral ankle and a large eschared pressure area on the right heel. We have been using Iodoflex to the 2 original wounds and a bed at 9 application to the eschared area on the heel 2/15; the first time we have seen this patient and then several months out of concern for the pandemic. She had a large horizontal wound in the mid tibia. Only the medial aspect of this is still open. Area on the lateral ankle is just about closed. She had a new pressure ulcer on the right heel which I have removed some of the eschar. We have been using  Iodoflex which I will continue. The area in the mid tibia has a round circle in the middle of exposed muscle. I think we would have to use an advanced treatment product to stimulate granulation over this area. We will run this through her insurance. She is not eligible for plastic surgery for many reasons 3/1; TELEHEALTH. The patient was seen today by telehealth. 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 We have been using Iodoflex to the wound areas which include the right anterior mid tibia, right lateral ankle and the right heel. All of these were reviewed 3/8; the patient's wound just above the right ankle is closed. She still has a contracting black eschar on the heel where she had a pressure ulcer. The medial part of her original wound on the mid tibia has exposed muscle. We have made really made no progress in this area although we have managed to get a lot of the original wound in this area to close. We used Apligraf #1 today Electronic Signature(s) Signed: 03/07/2020 5:29:15 PM By: Linton Ham MD Entered By: Linton Ham on 03/07/2020 10:16:27 -------------------------------------------------------------------------------- Physical Exam Details Patient Name: Date of Service: [name] [name] 03/07/2020 9:00 AM Medical Record Number:[id number] Patient Account Number: [id number] Date of Birth/Sex: Treating RN: [birth year] (69 y.o. [name] Primary Care Provider: [name] Other Clinician: Referring Provider: Treating Provider/Extender:Chrisanne Loose, Leotis Shames, MARY Weeks in Treatment: [phone] Notes Wound exam The area on the mid tibia medial part of this has exposed muscle. I packed the undermining area using Apligraf and layered this several times. Light debridement done before we use the Apligraf. The area on the right lateral leg is closed Contracting area on the right heel  using Betadine Electronic Signature(s) Signed: 03/07/2020 5:29:15 PM By: Linton Ham MD Entered By: Linton Ham on 03/07/2020 10:17:25 -------------------------------------------------------------------------------- Physician Orders Details Patient Name: Date of Service: [name] [name]. 03/07/2020 9:00 AM Medical Record Number:[id number] Patient Account Number: [id number] Date of Birth/Sex: Treating RN: 1951/08/27 (69 y.o. 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 Dressing Change Frequency Wound #16 Right Calcaneus Change Dressing every other day. Wound #5 Right,Medial Lower Leg Change Dressing every other day. - ***DO NOT REMOVE STERI STRIPS - CHANGE OUTSIDE BANDAGE ONLY*** Skin Barriers/Peri-Wound Care Moisturizing lotion - both legs Wound Cleansing May shower and wash wound with soap and water. - on days that dressing is changed Primary Wound Dressing Wound #16 Right Calcaneus Other: - paint with betadine Wound #5 Right,Medial Lower Leg Skin Substitute Application - Apligraf #1 Secondary Dressing Wound #16 Right Calcaneus Dry Gauze Heel Cup Wound #5 Right,Medial Lower Leg Adaptic Dressing - secure with steri strips Dry Gauze Edema Control Kerlix and Coban - Right Lower Extremity Avoid standing for  long periods of time Elevate legs to the level of the heart or above for 30 minutes daily and/or when sitting, a frequency of: - throughout the day Off-Loading Turn and reposition every 2 hours Other: - float heels off of bed/chair with pillow under calves Electronic Signature(s) Signed: 03/07/2020 5:29:15 PM By: Linton Ham MD Signed: 03/07/2020 5:59:01 PM By: Levan Hurst RN, BSN Entered By: Levan Hurst on 03/07/2020 10:06:30 -------------------------------------------------------------------------------- Problem List Details Patient Name: Date of Service: [name] [name] 03/07/2020 9:00 AM Medical Record Number:[id number] Patient Account Number: [id number] Date of Birth/Sex: Treating RN: 11/25/1951 (69 y.o. 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 02/24/2015 No Yes of calf and inflammation I87.331 Chronic venous hypertension (idiopathic) with ulcer 10/04/2016 No Yes and inflammation of right lower extremity L89.616 Pressure-induced deep tissue damage of right heel 12/14/2019 No Yes Inactive Problems ICD-10 Code Description Active Date Inactive Date 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 [name] Primary Care Provider: [name] Other Clinician: Referring Provider: Treating Provider/Extender:Skylynn Burkley, Leotis Shames, MARY Weeks in Treatment: 415 Subjective History of Present Illness (HPI) this is a patient who initially came to [place] for wounds on the medial malleoli bilaterally as well as her upper medial lower extremities bilaterally. These wounds eventually healed with assistance of Apligraf's bilaterally. While this was occurring she developed the current wound which opened into a fairly substantial wound on the right lower extremity. These are mostly secondary to venous stasis physiology however the patient also has underlying scleroderma, pulmonary hypertension. The wound has been making good progress lately with the Hydrofera Blue-based dressing. 03/17/2015; patient had a arterial evaluation a year ago.  Her right ABI was 0.86 left was 1.0. Toe brachial index was 0.41 on the right 0.45 on the left. Her bilateral common femoral artery waveforms were triphasic. Her white popliteal posterior tibial artery and anterior tibial artery waveforms were monophasic with good amplitude. Luteal artery waveforms were biphasic it was felt that her bilateral great toe pressures are of normal although adequate for tissue healing. 03/24/2015. The condition of this wound is not really improved that. He was covered with as fibrinous surface slough and eschar. This underwent an aggressive debridement with both a curette and scalpel. I still cannot really get down to what I can would consider to be a viable surface. There is no evidence of infection. Previous workup for ischemia roughly a  year ago was negative nevertheless I think that continues to be a concern 04/07/15. The patient arrived for application of second Apligraf. Once again the surface of this wound is certainly less viable than I would like for an advanced treatment option. An aggressive debridement was done. She developed some arteriolar bleeding which required that along pressure and silver alginate. 04/21/15: change in the condition of this wound. Once again it is covered in a gelatinous surface slough. After debridement today surface of the wound looks somewhat better but now a heeling surface 05/05/15 Apligraf #4 applied.Still a lot of slough on this wound. 05/19/15 Apligraf #5 applied. Still a lot of slough on this wound I did not aggressively remove this 06/02/15; continued copious amounts of surface slough. This debridement fairly easily. 06/08/2015 -- the last time she had a venous duplex study done was over 3 years ago and the surgery was prior to that. I have recommended that she sees Dr. early for a another opinion regarding a repeat venous duplex and possibly more endovenous ablation of vein stripping of micro-phlebotomies. 06/16/15; wound has a gelatinous surface eschar that the debridement fairly easily to a point. I don't disagree with the venous workup and perhaps even arterial re-evaluation. She is on prednisone 5 mg and continue his medications for her pulmonary artery hypertension I am not sure if the latter have any wound care healing issues I would need to investigate this. 06/23/15 continues with a gelatinous surface eschar with of fibrinous underlying. What I can see of this wound does not look unhealthy however I just can't get this material which I think is 2 different layers off. Empiric culture done 06/30/15; unfortunately not a lot of change in this wound. A gelatinous surface eschar is easily removed however it has a tight fibrinous surface underneath the. 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 The wound comes back and basically and unchanged state the. She has a gelatinous surface that is more easily removed however there is a tightly fibrinous surface underneath the. There is no evidence of infection. She has a vascular follow-up next month. I would have to inject her in order to do a more aggressive debridement of this area 07/21/15 the wound is roughly in the same state albeit the debridement is done with greater ease. There is less of the fibrinous eschar underneath the. There is no evidence of infection. She has follow-up with vascular surgery next week. No evidence of surrounding infection. Her original distal wounds healed while this one formed. 07/28/15; wound is easier to debride. No wound erythema. She is seeing Dr. [name] next week. 08/18/15 Has been to [place] for repeat ablation. Have been using medihoney pad with some improvement 08/25/15;  absolutely no change in the condition of this wound in either its overall size or surface condition in many months now. At one point I had this down to [place] healthier surface I think with [facility] however this did not actually progress towards closure. Do not believe that the wound has actually deteriorated in terms of volume at all. We have been using a medihoney pad which allows easier debridement of the gelatinous eschar but again no overall actual improvement. the patient is going towards an ablation with pain and vascular which I think is scheduled for next week. The only other investigation that I could first see would be a biopsy. She does have underlying scleroderma 09/02/15 eschar is much easier to debridement however the base of this does not look particularly vibrant. We changed to Iodoflex. The patient had her ablation earlier this week 09/09/15 again the debridement over the base of this wound is easier and the base of the wound looks considerably better. We will continue the Iodoflex. Dr. Tawni Millers has  expressed his satisfaction with the result of her ablation 09/15/15 once again the wound is relatively free of surface eschar. No debridement was done today. It has a pale- looking base to it. although this is not as deep as it once was it seems to be expanding especially inferiorly. She has had recent venous ablation but this is no closer to healing.I've gone ahead and done a punch biopsy this from the inferior part of the wound close to normal skin 09/22/15: the wound is relatively free of surface eschar. There is some surrounding eschar. I'm not exactly sure at what level the surface is that I am seeing. 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 No evidence of atypical infection, malignancy. 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 No debridement was done 09/29/15; the wound surface is relatively free of surface eschar. There is eschar surrounding the walls of the wound. Aside from the improvement in the amount of surface slough. This wound has not progressed any towards closure. There is not even a surface that looks like there at this is ready. There is no evidence of any infection nor maligancy based on biopsy I did on 9/15. I continued with the Iodoflex however I am looking towards some alternative to try to promote some closure or filling in of this surface. Consider triple layer Oasis. Collagen did not result in adequate control of the surface slough 10/13/15; the patient was in hospital last week with severe anemia. The wound looks somewhat better after debridement although there is widening medially. There is no evidence of infection. 10/20/15; patient's wound on the right lateral lower leg is essentially unchanged. This underwent a light surface debridement and in general the debridement is easier and the surface looks improved. I noted in doing this on the side of the wound what appears to be a piece of calcium deposition. The  patient noted that she had previous things on the tips of her fingers. 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 She follows with rheumatology and I have asked them to talk to her about this. In view of that S the nonhealing ulcer may have something to do with calcinosis and also unrelieved Raynaud's disease in this area. I should note that her original wounds on the right and left medial malleolus and the inner aspects of both legs just below the knees did however heal over  10/30/15; the patient's wound on the right lateral lower leg is essentially unchanged. I was able to remove some calcified material from the medial wound edge. 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 Otherwise the wound appears essentially unchanged there is less adherent surface eschar. Some of the calcified material was sent to pathology for analysis 11/17/15. The patient's wound on the right lateral leg is essentially unchanged. Wider Medially. 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 There is some surface slough but I did not debridement this. No evidence of surrounding infection. 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 This will make healing difficult 12/15/15; the wound has less depth surface has a fibrinous slough and calcifications in the wound edge no evidence of infection 12/22/15; the wound definitely has less Fibrinous slough on the base. Calcifications around the wound edges are still evident. Although the wound bed looks healthier it is still pale in appearance. Previous biopsy did not  show malignancy 01/04/15; surgical debridement of nonviable slough and subcutaneous tissue the wound cleans up quite nicely but appears to be expanding outward calcifications around the wound edges are still there. 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 She is to see her rheumatologist I'll see if he has any opinions on this. My punch biopsy done in September did not show calcifications although these are clearly evident. 01/19/16 light selective debridement of nonviable surface slough. There is epithelialization medially. This gives me reason for cautious optimism. She has been to see her rheumatologist, there is nothing that can be done for this type of soft tissue calcification associated with scleroderma 02/02/16 no debridement although there is a light surface slough. She has 2 peninsulas of skin 1 inferiorly and one medially. We continue to make a slight and slow but definite progressive here 02/16/16; light surface debridement with more attention to the circumference of the wound bed where the fibrinous eschar is more prevalent. No calcifications detected. She seems to have done nicely with the [facility] with some epithelialization and some improvement in the overall wound volume. 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 Continued attention to the circumference of the wound where the fibrinous eschar in calcinosis or prevalent. No calcifications were detected. She has continued improvement with Hydrofera Blue. The wound is no longer as deep 03/15/16 surgical debridement done to remove surface escha Especially around the circumference of the wound where there is nonviable subcutaneous tissue. In spite of this there is considerable improvement in the overall dimensions and depth of the wound. Islands of epithelialization  are seen especially medially inferiorly and superiorly to a lesser extent. She is using Hydrofera Blue at home 03/29/16; surgical debridement done to remove surface eschar and nonviable subcutaneous tissue. This cleans up quite nicely mention slightly larger in terms of length and width however depth is less 04/12/16; continued gradual improvement in terms of depth and the condition of the wound base. Debridement is done. Continuing long standing Hydrofera Blue at home with Kerlix Coban wraps 04/26/16; continued gradual improvement in terms of depth and management as well as condition of  the wound base. Surgical debridement done she continues with Hydrofera Blue. 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 She initially came to this clinic venous insufficiency ulcers which have long since healed 05/17/16 continued improvement in terms of the depth and measurements of this wound although she has a tightly adherent fibrinous slough each time. We've been continued with long standing Hydrofera Blue which seems to done as well for this wound is many advanced treatment options. 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 She also has chronic venous insufficiency. She has an irritation on her lateral right ankle secondary to our wraps 05/10/16; wound appears to be smaller especially on the medial aspect and especially in the width. Wound was debridement surface looks better. She is also been in the hospital apparently with anemia again she tells me she had an endoscopy. Since she got home after 3 days which I believe was sometime last week she has had an irritated painful area on the right lateral ankle surrounding the lateral malleolus 05/31/16; much more adherent surface slough today then recently although I don't think the dimensions of changed that much. A more aggressive debridement is required. The irritated area over her medial  malleolus is more pruritic and painful and I don't think represents cellulitis 06/14/16; no major change in her wound dimensions however there is more tightly adherent surface slough which is disappointing. As well as she appears to have a new small area medially. Furthermore an irritated uncomfortable area on the lateral aspect of her right foot just below the lateral malleolus. 06/21/16; I'm seeing the patient back in follow-up for the new areas under her major wound on the right anterior leg. She has been using Hydrofera Blue to this area probably for several months now and although the dressings seem to be helping for quite a period of time I think things have stagnated lately. She comes in today with a relatively tight adherent surface slough and really no changes in the wound shape or dimension. The 2 small areas she had inferiorly are tiny but still open they seem improved this well. There is no uncontrolled edema and I don't think there is any evidence of cellulitis. 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 Patient has had vascular evaluations both venous and arterial. I have biopsied this area. There is no obvious infection. The worrisome thing today is that she seems to be developing areas of erythema and epithelial damage on the medial aspect of the right foot. Also to a lesser degree inferior to the actual wound itself. Again I see no obvious changes to suggest cellulitis however as this is the only treatable option I will probably give her antibiotics. 07/13/16 no major change in the lady's large anterior right leg wound. Still covered with a very tightly adherent surface slough which is difficult to debridement. There is less erythema around this, culture last week grew pseudomonas I gave her ciprofloxacin. The area on her lateral right malleolus looks better- 08/02/16 the  patient's wounds continued to decline. Her original large anterior right leg wound looks deeper. Still adherent surface slough that is difficult to debridement. She has a small area just below this and a punched-out wound just below her lateral malleolus. In the meantime she is been in hospital with apparently an upper GI bleed on Plavix and aspirin.  She is now just on Plavix she received 3 units of packed cells 08/23/16; since I last saw this 3 weeks ago, the open large area on her right leg looks about the same syrup. She has a small satellite lesion just underneath this. The area on her medial right ankle is now a deep necrotic wound. I attempted to debridement this however there is just too much pain. 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 She follows with vascular surgery and is scheduled for an angiogram in early September 09/06/16; the patient is going for an arteriogram tomorrow. Her original large wound on the right calf is about the same the satellite lesion underneath it is about the same however the area on her medial ankle is now deeper with exposed tendon. I am no longer attempting to debride these wounds 09/20/16; the patient has undergone a right femoral endarterectomy and Dacron patch angioplasty. This seems to have helped the flow in her right leg. 10/04/16; Arrived today for aggressive debridement of the wounds on her right calf the original wound the one beneath it and a difficult area over her right lateral leg just above the lateral malleolus. 10/25/16; her 3 open wounds are about the same in terms of dimensions however the surface appears a lot healthier post debridement. Using Iodoflex 10/18/16 we have been using Iodoflex to her wounds which she tolerated with some difficulty. 10/11/16; has been using Santyl for a period of time with some improvement although again very adherent surface slough would  prevent any attempted healing this. She has a original wound on the left calf, the satellite underneath that and the most recent wound on the right medial ankle. She has completed revascularization by Dr. early and has had venous ablation earlier. Want to go back to Iodoflex to see if week and get a healthier surface to this wound bed failing this I think she'll need to be taken to the OR and I am prepared to call Dr. Marla Roe to discuss this. She is obviously not a good candidate for general anesthesia however.; 11/08/16; I put her on Iodoflex last time to see if I can get the wound bed any healthier and unfortunately today still had tremendous surface slough. 11/15/16; 4 weeks' worth of Iodoflex with not much improvement. Debridement on the major wounds on her left anterior leg is easier however this does not maintain from week to week. The punched-out area on her medial right ankle 11/29/16; I attempted to change the patient last visit to [facility] however she states this burned and was very uncomfortable therefore we gave her permission to go back to the eye out of complex which she already had at home. Also she noted a lot of pain and swelling on the lateral aspect of her leg before she traveled to [facility] for the holidays, I called her in doxycycline over the phone. This seems to have helped 12/06/16; Wounds unchanged by in large. Using Iodoflex 12/13/16; her wounds today actually looks somewhat better. The area on the right lateral lower leg has reasonably healthy-looking granulation and perhaps as actually filled and a bit. Debridement of the 2 wounds on the medial calf is easier and post debridement appears to have a healthier base. We have referred her to Dr. Migdalia Dk for consideration of operative debridement 12/20/16; we have a quick note from Dr. Merri Ray who feels that the patient needs to be referred to  an Diplomatic Services operational officer. This is due  to the complexity of the patient's medical issues as it applies to anything in the OR. We have been using Iodoflex 12/27/16; in general the wounds on the right leg are better in terms of the difficult to remove surface slough. She has been using Iodoflex. She is approved for Apligraf which I anticipated ordering in the next week or 2 when we get a better-looking surface 01/04/16 the deep wounds on the right leg generally look better. Both of them are debrided further surface slough. The area on the lateral right leg was not debrided. She is approved for Apligraf I think I'll probably order this either next week or the week after depending on the surface of the wounds superiorly. We have been using Iodoflex which will continue until then 01/11/16; the deep wounds on the right leg again have a surface slough that requires debridement. I've not been able to get the wound bed on either one of these wounds down to what would be acceptable for an advanced skin stab- like Apligraf. The area on the medial leg has been improving. We have been using hot Iodoflex to all wounds which seems to do the best at at least limiting the nonviable surface 01/24/17; we have continued Iodoflex and all her wound areas. Her debridement Gen. he is easier and the surface underneath this looks viable. Nevertheless these are large area wounds with exposed muscle at least on the anterior parts. We have ordered Apligraf's for 2 weeks from now. The patient will be away next week 02/07/17; the patient was close to have first Apligraf today however we did not order one. I therefore replaced her Iodoflex. She essentially has 3 large punched-out areas on her right anterior leg and right medial ankle. 02/11/17; Apligraf #1 02/25/17; Apligraf #2. In general some improvement in the right medial ankle and right anterior leg wounds. The larger wound medially perhaps some better 03/11/17; Apligraf #3. In general continued improvement in the  right medial ankle and the right anterior leg wounds. 03/25/17 Apligraf #4. In general continued improvement especially on the right medial ankle and the lower 04/08/17; Apligraf #5 in general continued improvement in all wound sites. 04/22/17; post Apligraf #5 her wound beds continued to look a lot better all of them up to the surface of the surrounding skin. Had a caramel-colored slough that I did not debrided in case there is residual Apligraf effect. The wounds are as good as I have seen these looking quite some time. 04/29/17; we applied Calvert and last week after completing her Apligraf. Wounds look as though they've contracted somewhat although they have a nonviable surface which was problematic in the past. Apparently she has been approved for further Apligraf's. We applied Iodosorb today after debridement. 05/06/17; we're fortunate enough today to be able to apply additional Apligraf approved by her insurance. In general all of her wounds look better Apligraf #6 05/24/17; Apligraf #7 continued improvement in all wounds o3 06/10/17 Apligraf #8. Continued improvement in the surface of all wounds. Not much of an improvement in dimensions as I might a follow 06/24/17 Apligraf #9 continued general improvement although not as much change in the wound areas I might of like. She has a new open area on the right anterior lateral ankle very small and superficial. She also has a necrotic wound on the tip of her right index finger probably secondary to severe uncontrolled Raynaud's phenomenon. She is already on sildenafil and already seen  her rheumatologist who gave her Keflex. 07/08/17; Apligraf #10. Generalized improvement although she has a small additional wound just medial to the major wound area. 07/22/17; after discussion we decided not to reorder any further Apligraf's although there is been considerable improvement with these it hasn't been recently. The major wound anteriorly looks better.  Smaller wounds beneath this and the more recent one and laterally look about the same. The area on the right lateral lower leg looks about the same 07/29/17 this is a patient who is exceedingly complex. She has advanced scleroderma, crest syndrome including calcinosis, PAD status post revascularization, chronic venous insufficiency status post ablations. She initially presented to this clinic with wounds on her bilateral lower legs however these closed. More recently we have been dealing with a large open area superiorly on the right anterior leg, a smaller wound underneath this and then another one on the right medial lower extremity. These improve significantly with 10 Apligraf applications. Over the last 2-3 weeks we are making good progress with Hydrofera Blue and these seem to be making progress towards closure 08/19/17; wounds continued to make good improvement with Hydrofera Blue and episodic aggressive debridements 08/26/17; still using Hydrofera Blue. Good improvements 09/09/17; using Hydrofera Blue continued improvement. Area on the lateral part of her right leg has only a small remaining open area. The small inferior satellite region is for all intents and purposes closed. Her major wound also is come in in terms of depth and has advancing epithelialization. 09/16/17; using Hydrofera Blue with continued improvement. The smaller satellite wound. We've closed out today along with a new were satellite wound medially. The area on her medial ankle is still open and her major wound is still open but making improvement. All using Hydrofera Blue. Currently 09/30/17; using Hydrofera Blue. Still a small open area on the lateral right ankle area and her original major wound seems to be making gradual and steady improvement. 10/14/17; still using Hydrofera Blue. Still too small open areas on the right lateral ankle. Her original major wound is horizontal and linear. The most problematic area  paradoxically seems to be the area to the medial area wears I thought it would be the lateral. 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 10/28/17; still using Hydrofera Blue. Right lateral ankle has a very small open area superiorly on the most lateral part of the wound. Her original open wound has 2 open areas now separated by normal skin and we've redefined this. 11/11/17; still using Hydrofera Blue area and right lateral ankle continues to have a small open area on mostly the lateral part of the wound. Her original wound has 2 small open areas now separated by a considerable amount of normal skin 11/28/17; the patient called in slightly before Thanksgiving to report pain and erythema above the wound on the right leg. In the past this is responded well to treatment for cellulitis and I gave her over the phone doxycycline. She stated this resulted in fairly abrupt improvement. We have been using Hydrofera Blue for a prolonged period of time to the larger wound anteriorly into the remaining wound on her right right lateral ankle. The latter is just about closed with only a small linear area and the bottom of the [place]. 12/02/17; use endoform and left the dressing on since last visit. There is no tenderness and no evidence of infection. 12/16/17; patient has been using Endoform but not making much progress. The 2 punched  out open areas anteriorly which were the reminiscence of her major wound appear deeper. The area on the lateral aspect of her right calf also appears deeper. Also she has a puzzling tender swelling above her wound on the right leg. This seems larger than last time. Just above her wounds there appears to be some fluctuance in this area it is not erythematous and there is no crepitus 12/30/17; patient has been using Endoform up until last week we used Hydrofera Blue. Ultrasound of the swelling above her 2 major wounds last time was  negative for a fluid collection. I gave her cefaclor for the erythema and tenderness in this area which seems better. Unfortunately both punched out areas anteriorly and the area on her right medial lower leg appear deeper. In fact the lateral of the wounds anteriorly actually looks as though it has exposed tendon and/or muscle sheath. She is not systemically unwell. She is complaining of vaginitis type symptoms presumably Candida from her antibiotics. 01/06/18; we're using santyl. she has 2 punched out areas anteriorly which were initially part of a large wound. Unfortunately medially this is now open to tendon/muscle. All the wounds have the same adherent very difficult to debride surface. 01/20/18; 2 week follow-up using Santyl. She has the 2 punched out areas anteriorly which were initially part of her large surface wound there. Medially this still has exposed muscle. All of these have the same tightly adherent necrotic surface which requires debridement. PuraPly was not accepted by the patient's insurance however her insurance I think it changed therefore we are going to run Apligraf to gain 02/03/18; the patient has been using Santyl. The wound on the right lateral ankle looks improved and the 2 areas anteriorly on the right leg looks about the same. The medial one has exposed muscle. The lateral 1 requiresdebridement. We use PuraPly today for the first week 02/10/18; PuraPly #2. The patient has 3 wounds. The area on the right lateral ankle, 2 areas anteriorly that were part of her original large wound in this area the medial one has exposed muscle. All of the wounds were lightly debrided with a number 3 curet. PuraPly #2 applied the lateral wound on the calf and the right lateral ankle look better. 02/17/18; PuraPly #3. Patient has 3 wounds. The area on the right lateral ankle in 2 areas internally that were part of her original large wound. The lateral area has exposed muscle. She arrived with  some complaints of pain around the right ankle. 02/24/18; PuraPly #4; not much change in any of the 3 wound areas. Right lateral ankle, right lateral calf. Both of these required debridement with a #3 curet. She tolerates this marginally. The area on the medial leg still has exposed muscle. Not much change in dimensions 03/03/18 PuraPly #5. The area on the medial ankle actually looks better however the 2 separate areas that were original parts of the larger right anterior leg wound look as though they're attempting to coalesce. 03/10/18; PuraPly #6. The area on the medial ankle actually continues to look and measures smaller however the 2 separate areas that were part of the original large wound on the right anterior leg have now coalesced. There hasn't been much improvement here. The lateral area actually has underlying exposed muscle 03/17/18-she is here in follow-up evaluation for ulcerations to the right lower extremity. She is voicing no complaints or concerns. She tolerated debridement. Puraply#7 placed 03/24/18; difficult right lower extremity ulcerations. PuraPly #8 place. She is been approved  for TheraSkins. She did very well with Apligraf today however she is apparently reached her "lifetime max" 03/31/18; marginal improvement with PuraPly although her wounds looked as good as they have in several weeks today. Used TheraSkins #1 04/14/18 TheraSkin #2 today 04/28/18 TheraSkins #3. Wound slightly improved 05/12/18; TheraSkin skin #4. Wound response has been variable. 05/27/18 TheraSkin #5. Generally improvement in all wound areas. I've also put her in 3 layer compression to help with the severe venous hypertension 06/09/18; patient is done quite well with the TheraSkins unfortunately we have no further applications. I also put her in 3 layer compression last week and that really seems to of helped. 06/16/18; we have been using silver collagen. Wounds are smaller. Still be open area to the muscle  layer of her calf however even that is contracted somewhat. She tells me that at night sometimes she has pain on the right lateral calf at the site of her lower wound. Notable that I put her into 3 layer compression about 3 weeks ago. She states that she dangles her leg over the bed that makes it feel better but she does not describe claudication during the day ooShe is going to call her secondary insurance to see if they will continue to cover advanced treatment products I have reviewed her arterial studies from 01/22/17; this showed an ABI in the right of 1 and on the left noncompressible. TBI on the right at 0.30 on the left at 0.34. It is therefore possible she has significant PAD with medial calcification falsely elevating her ABI into the normal range. I'll need to be careful about asking her about this next week it's possible the 3 layer compression is too much 06/23/18; was able to reapply TheraSkin 1 today. Edema control is good and she is not complaining of pain no claudication 07/07/18;no major change. New wound which was apparently a taper removal injury today in our clinic between her 2 wounds on the right calf TheraSkins #2 07/14/18; I think there is some improvement in the right lateral ankle and the medial part of her wound. There is still exposed muscle medially. 07/28/18; two-week follow-up. TheraSkins#3. Unfortunately no major change. She is not a candidate I don't think for skin grafting due to severe venous hypertension associated with her scleroderma and pulmonary hypertension 08/11/18 Patient is here today for her Theraskin application #78 (#5 of the second set). She seems to be doing well and in the base of the wound appears to show some progress at this point. This is the last approved Theraskin of the second set. 08/25/18; she has completed TheraSkin. There has been some improvement on the right lateral calf wound as well as the anterior leg wounds. The open area to muscle  medially on the anterior leg wound is smaller. I'm going to transition her back to [facility] under Kerlix Coban change every second day. She reports that she had some calcification removed from her right upper arm. We have had previous problems with calcifications in her wounds on her legs but that has not happened recently 09/08/18;using Hydrofera Blue on both her wound areas. Wounds seem to of contracted nicely. She uses Kerlix Coban wrap and changes at home herself 09/22/2018; using Hydrofera Blue on both her wound areas. Dimensions seem to have come down somewhat. There is certainly less depth in the medial part of the mid tibia wound and I do not think there is any exposed muscle at this point. 10/06/2018; 2-week follow-up. Using [facility] on  her wound areas. Dimensions have come down nicely both on the right lateral ankle area in the right mid tibial area. She has no new complaints 10/20/2018; 2-week follow-up. She is using Hydrofera Blue. Not much change from the last time she was here. The area on the lateral ankle has less depth although it has raised edges on one side. I attempted to remove as much of the raised edge as I could without creating more additional wounds. The area on the right anterior mid tibia area looks the same. 11/03/2018; 2-week follow-up she is using Hydrofera Blue. On the right anterior leg she now has 2 wounds separated by a large area of normal skin. The area on the medial part still has I think exposed muscle although this area itself is a lot smaller. The area laterally has some depth. Both areas with necrotic debris. ooThe area on her right lateral ankle has come in nicely 11/17/2018; patient continues to use Hydrofera Blue. We have been increasing separation of the 2 wounds anteriorly which were at one point joint the area on the right lateral calf continues to have I think some improvement in depth. 12/08/2018; patient continues to use Hydrofera Blue.  There is some improvement in the area on the right lateral calf. The 2 areas that were initially part of the original large wound in the mid right tibia are probably about the same. In fact the medial area is probably somewhat larger. We will run puraply through the patient's insurance 12/22/2018; she has been using Hydrofera Blue. We have small wounds on the right lateral calf and 2 small areas that were initially part of a large wound in the right mid tibia. We applied pure apply #1 today. 12/29/2018; we applied puraply #2. Her wounds look somewhat better especially on the right lateral calf and the lateral part of her original wound in the mid tibial area. 01/05/2019; perhaps slightly improved in terms of wound bed condition but certainly not as much improvement as I might of liked. Puraply #3 1/13: we did not have a correct sized puraply to apply. wounds more pinched out looking, I increased her compression to 3 layer last week to help with significant multilevel venous hypertension. Since then I've reviewed her arterial status. She has a right femoral endarterectomy and a distal left SFA stent. She was being followed by Dr. [name] for a period however she does not appear to have seen him in 3 years. I will set up an appointment. 1/20. The patient has an additional wound on the right lateral calf between the distal wound and proximal wounds. We did not have Puraply last week. Still does not have a follow-up with Dr. early 1/27: Follow-up with Dr. early has been arranged apparently with follow-up noninvasive studies. Wounds are measuring roughly the same although they certainly look smaller 2/3; the patient had non invasive studies. Her ABIs on the right were 0.83 and on the left 1.02 however there was no great toe pressure bilaterally. Also worrisome monophasic waveforms at the PTA and dorsalis pedis. We are still using Puraply. We have had some improvement in all of the wounds especially the  lateral part of the mid tibial area. 2/10; sees Dr. early of vein and vascular re-arterial studies next week. Puraply reapplied today. 2/17; Dr. early of vein and vascular his appointment is tomorrow. Puraply reapplied after debridement of all wounds 2/24; the patient saw Dr. early I reviewed his note. sHe noted the previous right femoral endarterectomy with a  Dacron patch. He also noted the normal ABI and the monophasic waveforms suggesting tibial disease. Overall he did not feel that she had any evidence of arterial insufficiency that would impair her wound healing. He did note her venous disease as well. He suggested PRN follow-up. 3/2; I had the last puraply applied today. The original wounds over the mid tibia area are improved where is the area on the right lower leg is not 3/9; wounds are smaller especially in the right mid tibia perhaps slightly in the right lateral calf. We finished with puraply and went to endoform today 3/23; the patient arrives after 2 weeks. She has been using endoform. I think all of her wounds look slightly better which includes the area on the right lateral calf just above the right lateral malleolus and the 2 in the right mid tibia which were initially part of the same wound. 4/27; TELEHEALTH visit; the patient was seen for telehealth visit today with her consent in the middle of the worldwide epidemic. Since she was last here she called in for antibiotics with pain and tenderness around the area on the right medial ankle. I gave her empiric doxycycline. She states this feels better. She is using endoform on both of these areas 5/11 TELEHEALTH; the patient was seen for telehealth visit today. She was accompanied at home by her husband. She has severe pulmonary hypertension accompanied scleroderma and in the face of the Covid epidemic cannot be safely brought into our clinic. We have been using endoform on her wound areas. There are essentially 3 wound areas now  in the left mid tibia now 2 open areas that it one point were connected and one on the right lateral ankle just above the malleolus. 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 She cannot come to clinic. The wounds were reviewed today via telehealth. She has severe chronic venous hypertension which I think is centrally mediated. She has wounds on her right anterior tibia and right lateral ankle area. These are chronic. She has been using endoform. 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 We have been following her from telehealth. She has severe chronic venous hypertension which may be mostly centrally mediated secondary to right heart heart failure. She has wounds on her right anterior tibia and right lateral ankle these are chronic we have been using endoform 6/22; TELEHEALTH; this patient was seen today via telehealth. 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 Since the last time we had contact with this patient she developed some pain and erythema around the wound on her right lateral malleolus/ankle and we put in antibiotics for her. This is resulted in good improvement with resolution of the erythema and tenderness. I changed her to silver alginate last time. We had been using endoform for an extended period of time 7/13; TELEHEALTH; this patient was seen today via telehealth. 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 She would continue to be at a prohibitive risk to be brought into our clinic unless this was absolutely necessary. These visits have been  done with her approval as well as her husband.  We have been using silver alginate to the areas on the right mid tibia and right lateral lower leg. 7/27 TELEHEALTH; patient was seen along with her husband today via telehealth. 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 We changed her to sample last visit. She has 2 areas a chronic wound on her right mid tibia and one just above her ankle. These were not the original wounds when she came into this clinic but she developed them during treatment 8/17; she comes in for her first face-to-face visit in a long period. She has a remaining area just medial to the right tibia which is the last open part of her large wound across this area. She also has an area on the right lateral lower leg. We prescribed Santyl last telehealth visit but they were concerned that this was making a deeper so they put silver alginate on it last week. Her husband changes the dressings. 8/31; using Santyl to the 2 wound areas some improvement in wound surfaces. Husband has surgery in 2 weeks we will put her out 3 weeks. Any of the advanced treatment options that I can think of that would be eligible for this wound would also cause her to have to come in weekly. The risk that the patient is just too high 9/21. Using Santyl to the 2 wound areas. Both of these are somewhat better although the medial mid tibia area still has exposed muscle. Lateral ankle requiring debridement. Using Santyl 10/12; using Santyl to the 2 wound areas. One on the right lateral ankle and the other in the medial calf which still has exposed muscle. Both areas have come down in size and have better looking surfaces. She has made nice progress with santyl 11/2; we have been using Santyl to the 2 wound areas. Right lateral ankle and the other in the mid tibia area the medial part of this still has exposed  muscle. 11/23/2019 on evaluation today patient appears to be doing about the same really with regard to her wounds. She is actually not very pleased with how things seem to be progressing at this point she tells me that she really has not noted much improvement unfortunately. With that being said there is no signs of active infection at this time. There is some slough buildup noted at this time which again along with some dry skin around the edges of the wound I think would benefit her to try to debride some of this away. Fortunately her pain is doing fairly well. She still has exposed muscle in the right medial/tibial area. 12/14; TELEHEALTH; she was changed to [facility] to the right calf wound and right lateral ankle wound when she was here last time. Unfortunately since then she had a fall with a pelvic fracture and a fracture of her wrist. She was apparently hospitalized for 5 days. I have not looked at her discharge summary. She apparently came out of the hospital with a blister on her right heel. She was seen today via telehealth by myself and our case manager. The patient and her husband were present. She has been using Hydrofera Blue at our direction from the last time she was in the clinic. There is been no major improvement in fact the areas appear deeper and with a less viable surface 01/04/2020; TELEHEALTH; the patient was seen today in accompaniment of her husband and our nurse. She has 3 open  areas 1 on the right medial mid tibia, one on the right lateral ankle and a large eschared pressure area on the right heel. We have been using Iodoflex to the 2 original wounds. 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 It is simply too perilous for her to be seen in any other way 1/26; TELEHEALTH; the patient was seen today in accompaniment of her husband and our RN one of our nurses. She still has the 3 open areas 1 on the right medial mid tibia which is  the remanent of a more extensive wound in this area, one on the right lateral ankle and a large eschared pressure area on the right heel. We have been using Iodoflex to the 2 original wounds and a bed at 9 application to the eschared area on the heel 2/15; the first time we have seen this patient and then several months out of concern for the pandemic. She had a large horizontal wound in the mid tibia. Only the medial aspect of this is still open. Area on the lateral ankle is just about closed. She had a new pressure ulcer on the right heel which I have removed some of the eschar. We have been using Iodoflex which I will continue. The area in the mid tibia has a round circle in the middle of exposed muscle. I think we would have to use an advanced treatment product to stimulate granulation over this area. We will run this through her insurance. She is not eligible for plastic surgery for many reasons 3/1; TELEHEALTH. The patient was seen today by telehealth. 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 We have been using Iodoflex to the wound areas which include the right anterior mid tibia, right lateral ankle and the right heel. All of these were reviewed 3/8; the patient's wound just above the right ankle is closed. She still has a contracting black eschar on the heel where she had a pressure ulcer. The medial part of her original wound on the mid tibia has exposed muscle. We have made really made no progress in this area although we have managed to get a lot of the original wound in this area to close. We used Apligraf #1 today Objective Constitutional Vitals Time Taken: 9:32 AM, Height: 68 in, Weight: 132 lbs, BMI: 20.1, Temperature: 98.2 F, Pulse: 70 bpm, Respiratory Rate: 18 breaths/min, Blood Pressure: 137/47 mmHg. Integumentary (Hair, Skin) Wound #16 status is Open. Original cause of wound  was Pressure Injury. The wound is located on the Right Calcaneus. The wound measures 1.2cm length x 1.5cm width x 0.1cm depth; 1.414cm^2 area and 0.141cm^3 volume. There is no undermining noted. There is a medium amount of serosanguineous drainage noted. The wound margin is flat and intact. There is no granulation within the wound bed. There is a large (67-100%) amount of necrotic tissue within the wound bed including Eschar. Wound #5 status is Open. Original cause of wound was Gradually Appeared. The wound is located on the Right,Medial Lower Leg. The wound measures 2cm length x 1.5cm width x 0.3cm depth; 2.356cm^2 area and 0.707cm^3 volume. There is muscle and Fat Layer (Subcutaneous Tissue) Exposed exposed. There is no tunneling or undermining noted. There is a medium amount of serosanguineous drainage noted. The wound margin is well defined and not attached to the wound base. There is medium (34-66%) pink, pale granulation within the wound bed. There is  a medium (34-66%) amount of necrotic tissue within the wound bed including Adherent Slough. Wound #7 status is Healed - Epithelialized. Original cause of wound was Gradually Appeared. The wound is located on the Right,Lateral Malleolus. The wound measures 0cm length x 0cm width x 0cm depth; 0cm^2 area and 0cm^3 volume. There is no tunneling or undermining noted. There is a none present amount of drainage noted. The wound margin is well defined and not attached to the wound base. There is no granulation within the wound bed. There is no necrotic tissue within the wound bed. 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 There was a Excisional Skin/Subcutaneous Tissue Debridement with a total area of 3 sq cm performed by Ricard Dillon., MD. With the following instrument(s): Curette to remove Viable and Non-Viable tissue/material. Material removed includes Subcutaneous Tissue and Slough and. No specimens were taken. A time out was conducted at 09:50, prior to the start of the procedure. A Minimum amount of bleeding was controlled with Pressure. The procedure was tolerated well with a pain level of 0 throughout and a pain level of 0 following the procedure. Post Debridement Measurements: 2cm length x 1.5cm width x 0.3cm depth; 0.707cm^3 volume. Character of Wound/Ulcer Post Debridement is improved. Severity of Tissue Post Debridement is: Fat layer exposed. Post procedure Diagnosis Wound #5: Same as Pre-Procedure Pre-procedure diagnosis of Wound #5 is a Venous Leg Ulcer located on the Right,Medial Lower Leg. A skin graft procedure using a bioengineered skin substitute/cellular or tissue based product was performed by Ricard Dillon., MD with the following instrument(s): Forceps. Apligraf was applied and secured with Steri-Strips. 20 sq cm of product was utilized and 24 sq cm was wasted due to wound size. Post Application, gauze was applied. A Time Out was conducted at 09:58, prior to the start of the procedure. The procedure was tolerated well with a pain level of 0 throughout and a pain level of 0 following the procedure. Post procedure Diagnosis Wound #5: Same as Pre-Procedure . Plan Follow-up Appointments: Return Appointment in 2 weeks. Dressing Change Frequency: Wound #16 Right Calcaneus: Change Dressing every other day. Wound #5 Right,Medial Lower  Leg: Change Dressing every other day. - ***DO NOT REMOVE STERI STRIPS - CHANGE OUTSIDE BANDAGE ONLY*** Skin Barriers/Peri-Wound Care: Moisturizing lotion - both legs Wound Cleansing: May shower and wash wound with soap and water. - on days that dressing is changed Primary Wound Dressing: Wound #16 Right Calcaneus: Other: - paint with betadine Wound #5 Right,Medial Lower Leg: Skin Substitute Application - Apligraf #1 Secondary Dressing: Wound #16 Right Calcaneus: Dry Gauze Heel Cup Wound #5 Right,Medial Lower Leg: Adaptic Dressing - secure with steri strips Dry Gauze Edema Control: Kerlix and Coban - Right Lower Extremity Avoid standing for long periods of time Elevate legs to the level of the heart or above for 30 minutes daily and/or when sitting, a frequency of: - throughout the day Off-Loading: Turn and reposition every 2 hours Other: - float heels off of bed/chair with pillow under calves  1. Apligraf #1. 2. Betadine continuing to the eschared area on the heel which seems to have contracted 3. The area on the right lateral lower calf just above the ankle has close Electronic Signature(s) Signed: 03/07/2020 5:29:15 PM By: Linton Ham MD Entered By: Linton Ham on 03/07/2020 10:18:05 -------------------------------------------------------------------------------- SuperBill Details Patient Name: Date of Service: [name] [name] 03/07/2020 Medical Record Number:[id number] Patient Account Number: [id number] Date of Birth/Sex: Treating RN: Mar 02, 1951 (69 y.o. [name] Primary Care Provider: [name] Other Clinician: Referring Provider: Treating Provider/Extender:Jessica Checketts, Leotis Shames, MARY Weeks in Treatment: 415 Diagnosis Coding ICD-10 Codes Code Description [id number] Non-pressure chronic ulcer of right calf with necrosis of muscle L97.811 Non-pressure chronic ulcer of other part of right lower leg limited to breakdown of skin I83.222 Varicose veins of  left lower extremity with both ulcer of calf and inflammation I87.331 Chronic venous hypertension (idiopathic) with ulcer and inflammation of right lower extremity L89.616 Pressure-induced deep tissue damage of right heel Facility Procedures CPT4: Description Modifier Quantity Code [id number] (Facility Use Only) Apligraf 1 SQ CM 44 Physician Procedures CPT4: Description Modifier Quantity Code [id number] - WC PHYS SKIN SUB GRAFT TRNK/ARM/LEG 1 ICD-10 Diagnosis Description I87.331 Chronic venous hypertension (idiopathic) with ulcer and inflammation of right lower extremity L97.213 Non-pressure chronic  ulcer of right calf with necrosis of muscle Electronic Signature(s) Signed: 03/07/2020 5:29:15 PM By: Linton Ham MD Signed: 03/07/2020 5:59:01 PM By: Levan Hurst RN, BSN Entered By: Levan Hurst on 03/07/2020 14:24:21

## 2020-03-08 DIAGNOSIS — M349 Systemic sclerosis, unspecified: Secondary | ICD-10-CM | POA: Diagnosis not present

## 2020-03-08 DIAGNOSIS — S32592D Other specified fracture of left pubis, subsequent encounter for fracture with routine healing: Secondary | ICD-10-CM | POA: Diagnosis not present

## 2020-03-08 DIAGNOSIS — S52592D Other fractures of lower end of left radius, subsequent encounter for closed fracture with routine healing: Secondary | ICD-10-CM | POA: Diagnosis not present

## 2020-03-08 DIAGNOSIS — G629 Polyneuropathy, unspecified: Secondary | ICD-10-CM | POA: Diagnosis not present

## 2020-03-08 DIAGNOSIS — I739 Peripheral vascular disease, unspecified: Secondary | ICD-10-CM | POA: Diagnosis not present

## 2020-03-08 DIAGNOSIS — S62102D Fracture of unspecified carpal bone, left wrist, subsequent encounter for fracture with routine healing: Secondary | ICD-10-CM | POA: Diagnosis not present

## 2020-03-09 ENCOUNTER — Other Ambulatory Visit: Payer: Self-pay | Admitting: Internal Medicine

## 2020-03-10 DIAGNOSIS — S62102D Fracture of unspecified carpal bone, left wrist, subsequent encounter for fracture with routine healing: Secondary | ICD-10-CM | POA: Diagnosis not present

## 2020-03-10 DIAGNOSIS — S32592D Other specified fracture of left pubis, subsequent encounter for fracture with routine healing: Secondary | ICD-10-CM | POA: Diagnosis not present

## 2020-03-10 DIAGNOSIS — S52592D Other fractures of lower end of left radius, subsequent encounter for closed fracture with routine healing: Secondary | ICD-10-CM | POA: Diagnosis not present

## 2020-03-10 DIAGNOSIS — M349 Systemic sclerosis, unspecified: Secondary | ICD-10-CM | POA: Diagnosis not present

## 2020-03-10 DIAGNOSIS — I739 Peripheral vascular disease, unspecified: Secondary | ICD-10-CM | POA: Diagnosis not present

## 2020-03-10 DIAGNOSIS — G629 Polyneuropathy, unspecified: Secondary | ICD-10-CM | POA: Diagnosis not present

## 2020-03-15 DIAGNOSIS — I739 Peripheral vascular disease, unspecified: Secondary | ICD-10-CM | POA: Diagnosis not present

## 2020-03-15 DIAGNOSIS — S32592D Other specified fracture of left pubis, subsequent encounter for fracture with routine healing: Secondary | ICD-10-CM | POA: Diagnosis not present

## 2020-03-15 DIAGNOSIS — G629 Polyneuropathy, unspecified: Secondary | ICD-10-CM | POA: Diagnosis not present

## 2020-03-15 DIAGNOSIS — S62102D Fracture of unspecified carpal bone, left wrist, subsequent encounter for fracture with routine healing: Secondary | ICD-10-CM | POA: Diagnosis not present

## 2020-03-15 DIAGNOSIS — M349 Systemic sclerosis, unspecified: Secondary | ICD-10-CM | POA: Diagnosis not present

## 2020-03-15 DIAGNOSIS — S52592D Other fractures of lower end of left radius, subsequent encounter for closed fracture with routine healing: Secondary | ICD-10-CM | POA: Diagnosis not present

## 2020-03-16 DIAGNOSIS — S52502D Unspecified fracture of the lower end of left radius, subsequent encounter for closed fracture with routine healing: Secondary | ICD-10-CM | POA: Diagnosis not present

## 2020-03-16 DIAGNOSIS — S3282XD Multiple fractures of pelvis without disruption of pelvic ring, subsequent encounter for fracture with routine healing: Secondary | ICD-10-CM | POA: Diagnosis not present

## 2020-03-17 ENCOUNTER — Ambulatory Visit (HOSPITAL_COMMUNITY): Payer: Medicare Other | Attending: Cardiovascular Disease

## 2020-03-17 ENCOUNTER — Other Ambulatory Visit: Payer: Self-pay

## 2020-03-17 DIAGNOSIS — I2729 Other secondary pulmonary hypertension: Secondary | ICD-10-CM

## 2020-03-21 ENCOUNTER — Other Ambulatory Visit: Payer: Self-pay

## 2020-03-21 ENCOUNTER — Encounter (HOSPITAL_BASED_OUTPATIENT_CLINIC_OR_DEPARTMENT_OTHER): Payer: Medicare Other | Admitting: Internal Medicine

## 2020-03-21 DIAGNOSIS — L97812 Non-pressure chronic ulcer of other part of right lower leg with fat layer exposed: Secondary | ICD-10-CM | POA: Diagnosis not present

## 2020-03-21 DIAGNOSIS — I2721 Secondary pulmonary arterial hypertension: Secondary | ICD-10-CM | POA: Diagnosis not present

## 2020-03-21 DIAGNOSIS — I83222 Varicose veins of left lower extremity with both ulcer of calf and inflammation: Secondary | ICD-10-CM | POA: Diagnosis not present

## 2020-03-21 DIAGNOSIS — I10 Essential (primary) hypertension: Secondary | ICD-10-CM | POA: Diagnosis not present

## 2020-03-21 DIAGNOSIS — I872 Venous insufficiency (chronic) (peripheral): Secondary | ICD-10-CM | POA: Diagnosis not present

## 2020-03-21 DIAGNOSIS — L89616 Pressure-induced deep tissue damage of right heel: Secondary | ICD-10-CM | POA: Diagnosis not present

## 2020-03-21 DIAGNOSIS — L97811 Non-pressure chronic ulcer of other part of right lower leg limited to breakdown of skin: Secondary | ICD-10-CM | POA: Diagnosis not present

## 2020-03-21 DIAGNOSIS — L97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: Secondary | ICD-10-CM | POA: Diagnosis not present

## 2020-03-21 NOTE — Progress Notes (Signed)
MATIE, DIMAANO ([id number]) Visit Report for 03/21/2020 Cellular or Tissue Based Product Details Patient Name: Date of Service: [name] [name], [name] [name] 03/21/2020 9:00 AM Medical Record Number:[id number] Patient Account Number: [id number] Date of Birth/Sex: Treating RN: Jul 29, 1951 (69 y.o. [name] Primary Care Provider: [name] Other Clinician: Referring Provider: Treating Provider/Extender:Thao Bauza, Leotis Shames, MARY Weeks in Treatment: [id number] Cellular or Tissue Based Wound #5 Right,Medial Lower Leg Product Type Applied to: Performed By: Physician Ricard Dillon., MD Cellular or Tissue Based Apligraf Product Type: Level of Consciousness (Pre- Awake and Alert procedure): Pre-procedure Yes - 10:04 Verification/Time Out Taken: Location: trunk / arms / legs Wound Size (sq cm): 2.85 Product Size (sq cm): 44 Waste Size (sq cm): 22 Waste Reason: wound size Amount of Product Applied (sq cm): 22 Lot #: [id number] Order #: 2 Expiration Date: 03/29/2020 Fenestrated: Yes Instrument: Blade Reconstituted: Yes Solution Type: normal saline Solution Amount: 10 ml Lot #: [id number] Solution Expiration Date: 08/30/2021 Secured: Yes Secured With: Steri-Strips Dressing Applied: Yes Primary Dressing: gauze, adaptic Procedural Pain: 0 Post Procedural Pain: 0 Response to Treatment: Procedure was tolerated well Level of Consciousness Awake and Alert (Post-procedure): Post Procedure Diagnosis Same as Pre-procedure Electronic Signature(s) Signed: 03/21/2020 5:34:39 PM By: Linton Ham MD Entered By: Linton Ham on 03/21/2020 10:57:47 -------------------------------------------------------------------------------- Debridement Details Patient Name: Date of Service: [name] [name] 03/21/2020 9:00 AM Medical Record Number:[id number] Patient Account Number: [id number] Date of Birth/Sex: Treating RN: 1951-01-30 (69 y.o. [name] Primary Care Provider: [name] Other Clinician: Referring Provider: Treating Provider/Extender:Shamyah Stantz, Leotis Shames, MARY Weeks in Treatment: 417 Debridement Performed for Wound #5 Right,Medial Lower Leg Assessment: Performed By: Physician Ricard Dillon., MD Debridement Type: Debridement Severity of Tissue Pre Muscle involvement without necrosis Debridement: Level of Consciousness (Pre- Awake and Alert procedure): Pre-procedure Yes - 10:00 Verification/Time Out Taken: Start Time: 10:00 Pain Control: Other : Benzocaine 20% Total Area Debrided (L x W): 1.9 (cm) x 1.5 (cm) = 2.85 (cm) Tissue and other material Viable, Non-Viable, Slough, Subcutaneous, Slough debrided: Level: Skin/Subcutaneous Tissue Debridement Description: Excisional Instrument: Curette Bleeding: Minimum Hemostasis Achieved: Pressure End Time: 10:03 Procedural Pain: 0 Post Procedural Pain: 0 Response to Treatment: Procedure was tolerated well Level of Consciousness Awake and Alert (Post-procedure): Post Debridement Measurements of Total Wound Length: (cm) 1.9 Width: (cm) 1.5 Depth: (cm) 0.3 Volume: (cm) 0.672 Character of Wound/Ulcer Post Improved Debridement: Severity of Tissue Post Debridement: Fat layer exposed Post Procedure Diagnosis Same as Pre-procedure Electronic Signature(s) Signed: 03/21/2020 5:18:21 PM By: Levan Hurst RN, BSN Signed: 03/21/2020 5:34:39 PM By: Linton Ham MD Entered By: Linton Ham on 03/21/2020 10:57:37 -------------------------------------------------------------------------------- HPI Details Patient Name: Date of Service: [name] [name] 03/21/2020 9:00 AM Medical Record Number:[id number] Patient Account Number: [id number] Date of Birth/Sex: Treating RN: 04-28-1951 (70 y.o. [name] Primary Care Provider: [name] Other Clinician: Referring Provider: Treating Provider/Extender:Patricia Fargo, Leotis Shames, MARY Weeks in Treatment: 417 History of Present  Illness HPI Description: this is a patient who initially came to [place] for wounds on the medial malleoli bilaterally as well as her upper medial lower extremities bilaterally. These wounds eventually healed with assistance of Apligraf's bilaterally. While this was occurring she developed the current wound which opened into a fairly substantial wound on the right lower extremity. These are mostly secondary to venous stasis physiology however the patient also has underlying scleroderma, pulmonary hypertension. The wound has been making good progress lately with the Hydrofera Blue-based dressing. 03/17/2015; patient had a arterial evaluation a year ago.  Her right ABI was 0.86 left was 1.0. Toe brachial index was 0.41 on the right 0.45 on the left. Her bilateral common femoral artery waveforms were triphasic. Her white popliteal posterior tibial artery and anterior tibial artery waveforms were monophasic with good amplitude. Luteal artery waveforms were biphasic it was felt that her bilateral great toe pressures are of normal although adequate for tissue healing. 03/24/2015. The condition of this wound is not really improved that. He was covered with as fibrinous surface slough and eschar. This underwent an aggressive debridement with both a curette and scalpel. I still cannot really get down to what I can would consider to be a viable surface. There is no evidence of infection. Previous workup for ischemia roughly a year ago was negative nevertheless I think that continues to be a concern 04/07/15. The patient arrived for application of second Apligraf. Once again the surface of this wound is certainly less viable than I would like for an advanced treatment option. An aggressive debridement was done. She developed some arteriolar bleeding which required that along pressure and silver alginate. 04/21/15: change in the condition of this wound. Once again it is covered in a gelatinous surface slough.  After debridement today surface of the wound looks somewhat better but now a heeling surface 05/05/15 Apligraf #4 applied.Still a lot of slough on this wound. 05/19/15 Apligraf #5 applied. Still a lot of slough on this wound I did not aggressively remove this 06/02/15; continued copious amounts of surface slough. This debridement fairly easily. 06/08/2015 -- the last time she had a venous duplex study done was over 3 years ago and the surgery was prior to that. I have recommended that she sees Dr. early for a another opinion regarding a repeat venous duplex and possibly more endovenous ablation of vein stripping of micro-phlebotomies. 06/16/15; wound has a gelatinous surface eschar that the debridement fairly easily to a point. I don't disagree with the venous workup and perhaps even arterial re-evaluation. She is on prednisone 5 mg and continue his medications for her pulmonary artery hypertension I am not sure if the latter have any wound care healing issues I would need to investigate this. 06/23/15 continues with a gelatinous surface eschar with of fibrinous underlying. What I can see of this wound does not look unhealthy however I just can't get this material which I think is 2 different layers off. Empiric culture done 06/30/15; unfortunately not a lot of change in this wound. A gelatinous surface eschar is easily removed however it has a tight fibrinous surface underneath the. 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 The wound comes back and basically and unchanged state the. She has a gelatinous surface that is more easily removed however there is a tightly fibrinous surface underneath the. There is no evidence of infection. She has a vascular follow-up next month. I would have to inject her in order to do a more aggressive debridement of this area 07/21/15 the wound is roughly in the same state albeit the debridement is done with greater ease. There is less of the fibrinous  eschar underneath the. There is no evidence of infection. She has follow-up with vascular surgery next week. No evidence of surrounding infection. Her original distal wounds healed while this one formed. 07/28/15; wound is easier to debride. No wound erythema. She is seeing Dr. [name] next week. 08/18/15 Has been to [place] for repeat ablation. Have been using medihoney pad with some improvement 08/25/15;  absolutely no change in the condition of this wound in either its overall size or surface condition in many months now. At one point I had this down to [place] healthier surface I think with [facility] however this did not actually progress towards closure. Do not believe that the wound has actually deteriorated in terms of volume at all. We have been using a medihoney pad which allows easier debridement of the gelatinous eschar but again no overall actual improvement. the patient is going towards an ablation with pain and vascular which I think is scheduled for next week. The only other investigation that I could first see would be a biopsy. She does have underlying scleroderma 09/02/15 eschar is much easier to debridement however the base of this does not look particularly vibrant. We changed to Iodoflex. The patient had her ablation earlier this week 09/09/15 again the debridement over the base of this wound is easier and the base of the wound looks considerably better. We will continue the Iodoflex. Dr. Tawni Millers has expressed his satisfaction with the result of her ablation 09/15/15 once again the wound is relatively free of surface eschar. No debridement was done today. It has a pale- looking base to it. although this is not as deep as it once was it seems to be expanding especially inferiorly. She has had recent venous ablation but this is no closer to healing.I've gone ahead and done a punch biopsy this from the inferior part of the wound close to normal skin 09/22/15: the wound is relatively  free of surface eschar. There is some surrounding eschar. I'm not exactly sure at what level the surface is that I am seeing. 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 No evidence of atypical infection, malignancy. 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 No debridement was done 09/29/15; the wound surface is relatively free of surface eschar. There is eschar surrounding the walls of the wound. Aside from the improvement in the amount of surface slough. This wound has not progressed any towards closure. There is not even a surface that looks like there at this is ready. There is no evidence of any infection nor maligancy based on biopsy I did on 9/15. I continued with the Iodoflex however I am looking towards some alternative to try to promote some closure or filling in of this surface. Consider triple layer Oasis. Collagen did not result in adequate control of the surface slough 10/13/15; the patient was in hospital last week with severe anemia. The wound looks somewhat better after debridement although there is widening medially. There is no evidence of infection. 10/20/15; patient's wound on the right lateral lower leg is essentially unchanged. This underwent a light surface debridement and in general the debridement is easier and the surface looks improved. I noted in doing this on the side of the wound what appears to be a piece of calcium deposition. The patient noted that she had previous things on the tips of her fingers. 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 She follows with rheumatology and I have asked them to talk to her about this. In view of that S the nonhealing ulcer may have something to do with calcinosis and also unrelieved Raynaud's disease in this area. I should note that her original wounds on the right and left medial malleolus and the  inner aspects of both legs just below the knees did however heal over  10/30/15; the patient's wound on the right lateral lower leg is essentially unchanged. I was able to remove some calcified material from the medial wound edge. 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 Otherwise the wound appears essentially unchanged there is less adherent surface eschar. Some of the calcified material was sent to pathology for analysis 11/17/15. The patient's wound on the right lateral leg is essentially unchanged. Wider Medially. 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 There is some surface slough but I did not debridement this. No evidence of surrounding infection. 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 This will make healing difficult 12/15/15; the wound has less depth surface has a fibrinous slough and calcifications in the wound edge no evidence of infection 12/22/15; the wound definitely has less Fibrinous slough on the base. Calcifications around the wound edges are still evident. Although the wound bed looks healthier it is still pale in appearance. Previous biopsy did not show malignancy 01/04/15; surgical debridement of nonviable slough and subcutaneous tissue the wound cleans up quite nicely but appears to be expanding outward calcifications around the wound edges are still there. 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 She is to see her rheumatologist I'll see if he has any opinions on this. My punch biopsy done in September did not show calcifications although these are clearly evident. 01/19/16 light selective  debridement of nonviable surface slough. There is epithelialization medially. This gives me reason for cautious optimism. She has been to see her rheumatologist, there is nothing that can be done for this type of soft tissue calcification associated with scleroderma 02/02/16 no debridement although there is a light surface slough. She has 2 peninsulas of skin 1 inferiorly and one medially. We continue to make a slight and slow but definite progressive here 02/16/16; light surface debridement with more attention to the circumference of the wound bed where the fibrinous eschar is more prevalent. No calcifications detected. She seems to have done nicely with the [facility] with some epithelialization and some improvement in the overall wound volume. 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 Continued attention to the circumference of the wound where the fibrinous eschar in calcinosis or prevalent. No calcifications were detected. She has continued improvement with Hydrofera Blue. The wound is no longer as deep 03/15/16 surgical debridement done to remove surface escha Especially around the circumference of the wound where there is nonviable subcutaneous tissue. In spite of this there is considerable improvement in the overall dimensions and depth of the wound. Islands of epithelialization are seen especially medially inferiorly and superiorly to a lesser extent. She is using Hydrofera Blue at home 03/29/16; surgical debridement done to remove surface eschar and nonviable subcutaneous tissue. This cleans up quite nicely mention slightly larger in terms of length and width however depth is less 04/12/16; continued gradual improvement in terms of depth and the condition of the wound base. Debridement is done. Continuing long standing Hydrofera Blue at home with Kerlix Coban  wraps 04/26/16; continued gradual improvement in terms of depth and management as well as condition of  the wound base. Surgical debridement done she continues with Hydrofera Blue. 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 She initially came to this clinic venous insufficiency ulcers which have long since healed 05/17/16 continued improvement in terms of the depth and measurements of this wound although she has a tightly adherent fibrinous slough each time. We've been continued with long standing Hydrofera Blue which seems to done as well for this wound is many advanced treatment options. 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 She also has chronic venous insufficiency. She has an irritation on her lateral right ankle secondary to our wraps 05/10/16; wound appears to be smaller especially on the medial aspect and especially in the width. Wound was debridement surface looks better. She is also been in the hospital apparently with anemia again she tells me she had an endoscopy. Since she got home after 3 days which I believe was sometime last week she has had an irritated painful area on the right lateral ankle surrounding the lateral malleolus 05/31/16; much more adherent surface slough today then recently although I don't think the dimensions of changed that much. A more aggressive debridement is required. The irritated area over her medial malleolus is more pruritic and painful and I don't think represents cellulitis 06/14/16; no major change in her wound dimensions however there is more tightly adherent surface slough which is disappointing. As well as she appears to have a new small area medially. Furthermore an irritated uncomfortable area on the lateral aspect of her right foot just below the lateral malleolus. 06/21/16; I'm seeing the patient back in follow-up for the new areas under her major wound on the right  anterior leg. She has been using Hydrofera Blue to this area probably for several months now and although the dressings seem to be helping for quite a period of time I think things have stagnated lately. She comes in today with a relatively tight adherent surface slough and really no changes in the wound shape or dimension. The 2 small areas she had inferiorly are tiny but still open they seem improved this well. There is no uncontrolled edema and I don't think there is any evidence of cellulitis. 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 Patient has had vascular evaluations both venous and arterial. I have biopsied this area. There is no obvious infection. The worrisome thing today is that she seems to be developing areas of erythema and epithelial damage on the medial aspect of the right foot. Also to a lesser degree inferior to the actual wound itself. Again I see no obvious changes to suggest cellulitis however as this is the only treatable option I will probably give her antibiotics. 07/13/16 no major change in the lady's large anterior right leg wound. Still covered with a very tightly adherent surface slough which is difficult to debridement. There is less erythema around this, culture last week grew pseudomonas I gave her ciprofloxacin. The area on her lateral right malleolus looks better- 08/02/16 the patient's wounds continued to decline. Her original large anterior right leg wound looks deeper. Still adherent surface slough that is difficult to debridement. She has a small area just below this and a punched-out wound just below her lateral malleolus. In the meantime she is been in hospital with apparently an upper GI bleed on Plavix and aspirin. She  is now just on Plavix she received 3 units of packed cells 08/23/16; since I last saw this 3 weeks ago, the open large area on her  right leg looks about the same syrup. She has a small satellite lesion just underneath this. The area on her medial right ankle is now a deep necrotic wound. I attempted to debridement this however there is just too much pain. 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 She follows with vascular surgery and is scheduled for an angiogram in early September 09/06/16; the patient is going for an arteriogram tomorrow. Her original large wound on the right calf is about the same the satellite lesion underneath it is about the same however the area on her medial ankle is now deeper with exposed tendon. I am no longer attempting to debride these wounds 09/20/16; the patient has undergone a right femoral endarterectomy and Dacron patch angioplasty. This seems to have helped the flow in her right leg. 10/04/16; Arrived today for aggressive debridement of the wounds on her right calf the original wound the one beneath it and a difficult area over her right lateral leg just above the lateral malleolus. 10/25/16; her 3 open wounds are about the same in terms of dimensions however the surface appears a lot healthier post debridement. Using Iodoflex 10/18/16 we have been using Iodoflex to her wounds which she tolerated with some difficulty. 10/11/16; has been using Santyl for a period of time with some improvement although again very adherent surface slough would prevent any attempted healing this. She has a original wound on the left calf, the satellite underneath that and the most recent wound on the right medial ankle. She has completed revascularization by Dr. early and has had venous ablation earlier. Want to go back to Iodoflex to see if week and get a healthier surface to this wound bed failing this I think she'll need to be taken to the OR and I am prepared to call Dr. Marla Roe to discuss this. She is obviously not a good candidate  for general anesthesia however.; 11/08/16; I put her on Iodoflex last time to see if I can get the wound bed any healthier and unfortunately today still had tremendous surface slough. 11/15/16; 4 weeks' worth of Iodoflex with not much improvement. Debridement on the major wounds on her left anterior leg is easier however this does not maintain from week to week. The punched-out area on her medial right ankle 11/29/16; I attempted to change the patient last visit to [facility] however she states this burned and was very uncomfortable therefore we gave her permission to go back to the eye out of complex which she already had at home. Also she noted a lot of pain and swelling on the lateral aspect of her leg before she traveled to [place] [facility] for the holidays, I called her in doxycycline over the phone. This seems to have helped 12/06/16; Wounds unchanged by in large. Using Iodoflex 12/13/16; her wounds today actually looks somewhat better. The area on the right lateral lower leg has reasonably healthy-looking granulation and perhaps as actually filled and a bit. Debridement of the 2 wounds on the medial calf is easier and post debridement appears to have a healthier base. We have referred her to Dr. Migdalia Dk for consideration of operative debridement 12/20/16; we have a quick note from Dr. Merri Ray who feels that the patient needs to be referred to  an Diplomatic Services operational officer. This is due to the complexity of the patient's medical issues as it applies to anything in the OR. We have been using Iodoflex 12/27/16; in general the wounds on the right leg are better in terms of the difficult to remove surface slough. She has been using Iodoflex. She is approved for Apligraf which I anticipated ordering in the next week or 2 when we get a better-looking surface 01/04/16 the deep wounds on the right leg generally look better. Both of them are debrided further surface slough.  The area on the lateral right leg was not debrided. She is approved for Apligraf I think I'll probably order this either next week or the week after depending on the surface of the wounds superiorly. We have been using Iodoflex which will continue until then 01/11/16; the deep wounds on the right leg again have a surface slough that requires debridement. I've not been able to get the wound bed on either one of these wounds down to what would be acceptable for an advanced skin stab- like Apligraf. The area on the medial leg has been improving. We have been using hot Iodoflex to all wounds which seems to do the best at at least limiting the nonviable surface 01/24/17; we have continued Iodoflex and all her wound areas. Her debridement Gen. he is easier and the surface underneath this looks viable. Nevertheless these are large area wounds with exposed muscle at least on the anterior parts. We have ordered Apligraf's for 2 weeks from now. The patient will be away next week 02/07/17; the patient was close to have first Apligraf today however we did not order one. I therefore replaced her Iodoflex. She essentially has 3 large punched-out areas on her right anterior leg and right medial ankle. 02/11/17; Apligraf #1 02/25/17; Apligraf #2. In general some improvement in the right medial ankle and right anterior leg wounds. The larger wound medially perhaps some better 03/11/17; Apligraf #3. In general continued improvement in the right medial ankle and the right anterior leg wounds. 03/25/17 Apligraf #4. In general continued improvement especially on the right medial ankle and the lower 04/08/17; Apligraf #5 in general continued improvement in all wound sites. 04/22/17; post Apligraf #5 her wound beds continued to look a lot better all of them up to the surface of the surrounding skin. Had a caramel-colored slough that I did not debrided in case there is residual Apligraf effect. The wounds are as good as I have  seen these looking quite some time. 04/29/17; we applied [place] and last week after completing her Apligraf. Wounds look as though they've contracted somewhat although they have a nonviable surface which was problematic in the past. Apparently she has been approved for further Apligraf's. We applied Iodosorb today after debridement. 05/06/17; we're fortunate enough today to be able to apply additional Apligraf approved by her insurance. In general all of her wounds look better Apligraf #6 05/24/17; Apligraf #7 continued improvement in all wounds 3 06/10/17 Apligraf #8. Continued improvement in the surface of all wounds. Not much of an improvement in dimensions as I might a follow 06/24/17 Apligraf #9 continued general improvement although not as much change in the wound areas I might of like. She has a new open area on the right anterior lateral ankle very small and superficial. She also has a necrotic wound on the tip of her right index finger probably secondary to severe uncontrolled Raynaud's phenomenon. She is already on sildenafil and already seen  her rheumatologist who gave her Keflex. 07/08/17; Apligraf #10. Generalized improvement although she has a small additional wound just medial to the major wound area. 07/22/17; after discussion we decided not to reorder any further Apligraf's although there is been considerable improvement with these it hasn't been recently. The major wound anteriorly looks better. Smaller wounds beneath this and the more recent one and laterally look about the same. The area on the right lateral lower leg looks about the same 07/29/17 this is a patient who is exceedingly complex. She has advanced scleroderma, crest syndrome including calcinosis, PAD status post revascularization, chronic venous insufficiency status post ablations. She initially presented to this clinic with wounds on her bilateral lower legs however these closed. More recently we have been dealing  with a large open area superiorly on the right anterior leg, a smaller wound underneath this and then another one on the right medial lower extremity. These improve significantly with 10 Apligraf applications. Over the last 2-3 weeks we are making good progress with Hydrofera Blue and these seem to be making progress towards closure 08/19/17; wounds continued to make good improvement with Hydrofera Blue and episodic aggressive debridements 08/26/17; still using Hydrofera Blue. Good improvements 09/09/17; using Hydrofera Blue continued improvement. Area on the lateral part of her right leg has only a small remaining open area. The small inferior satellite region is for all intents and purposes closed. Her major wound also is come in in terms of depth and has advancing epithelialization. 09/16/17; using Hydrofera Blue with continued improvement. The smaller satellite wound. We've closed out today along with a new were satellite wound medially. The area on her medial ankle is still open and her major wound is still open but making improvement. All using Hydrofera Blue. Currently 09/30/17; using Hydrofera Blue. Still a small open area on the lateral right ankle area and her original major wound seems to be making gradual and steady improvement. 10/14/17; still using Hydrofera Blue. Still too small open areas on the right lateral ankle. Her original major wound is horizontal and linear. The most problematic area paradoxically seems to be the area to the medial area wears I thought it would be the lateral. 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 10/28/17; still using Hydrofera Blue. Right lateral ankle has a very small open area superiorly on the most lateral part of the wound. Her original open wound has 2 open areas now separated by normal skin and we've redefined this. 11/11/17; still using Hydrofera Blue area and right lateral ankle continues to have a  small open area on mostly the lateral part of the wound. Her original wound has 2 small open areas now separated by a considerable amount of normal skin 11/28/17; the patient called in slightly before Thanksgiving to report pain and erythema above the wound on the right leg. In the past this is responded well to treatment for cellulitis and I gave her over the phone doxycycline. She stated this resulted in fairly abrupt improvement. We have been using Hydrofera Blue for a prolonged period of time to the larger wound anteriorly into the remaining wound on her right right lateral ankle. The latter is just about closed with only a small linear area and the bottom of the [place]. 12/02/17; use endoform and left the dressing on since last visit. There is no tenderness and no evidence of infection. 12/16/17; patient has been using Endoform but not making much progress. The 2 punched  out open areas anteriorly which were the reminiscence of her major wound appear deeper. The area on the lateral aspect of her right calf also appears deeper. Also she has a puzzling tender swelling above her wound on the right leg. This seems larger than last time. Just above her wounds there appears to be some fluctuance in this area it is not erythematous and there is no crepitus 12/30/17; patient has been using Endoform up until last week we used Hydrofera Blue. Ultrasound of the swelling above her 2 major wounds last time was negative for a fluid collection. I gave her cefaclor for the erythema and tenderness in this area which seems better. Unfortunately both punched out areas anteriorly and the area on her right medial lower leg appear deeper. In fact the lateral of the wounds anteriorly actually looks as though it has exposed tendon and/or muscle sheath. She is not systemically unwell. She is complaining of vaginitis type symptoms presumably Candida from her antibiotics. 01/06/18; we're using santyl. she has 2 punched  out areas anteriorly which were initially part of a large wound. Unfortunately medially this is now open to tendon/muscle. All the wounds have the same adherent very difficult to debride surface. 01/20/18; 2 week follow-up using Santyl. She has the 2 punched out areas anteriorly which were initially part of her large surface wound there. Medially this still has exposed muscle. All of these have the same tightly adherent necrotic surface which requires debridement. PuraPly was not accepted by the patient's insurance however her insurance I think it changed therefore we are going to run Apligraf to gain 02/03/18; the patient has been using Santyl. The wound on the right lateral ankle looks improved and the 2 areas anteriorly on the right leg looks about the same. The medial one has exposed muscle. The lateral 1 requiresdebridement. We use PuraPly today for the first week 02/10/18; PuraPly #2. The patient has 3 wounds. The area on the right lateral ankle, 2 areas anteriorly that were part of her original large wound in this area the medial one has exposed muscle. All of the wounds were lightly debrided with a number 3 curet. PuraPly #2 applied the lateral wound on the calf and the right lateral ankle look better. 02/17/18; PuraPly #3. Patient has 3 wounds. The area on the right lateral ankle in 2 areas internally that were part of her original large wound. The lateral area has exposed muscle. She arrived with some complaints of pain around the right ankle. 02/24/18; PuraPly #4; not much change in any of the 3 wound areas. Right lateral ankle, right lateral calf. Both of these required debridement with a #3 curet. She tolerates this marginally. The area on the medial leg still has exposed muscle. Not much change in dimensions 03/03/18 PuraPly #5. The area on the medial ankle actually looks better however the 2 separate areas that were original parts of the larger right anterior leg wound look as though  they're attempting to coalesce. 03/10/18; PuraPly #6. The area on the medial ankle actually continues to look and measures smaller however the 2 separate areas that were part of the original large wound on the right anterior leg have now coalesced. There hasn't been much improvement here. The lateral area actually has underlying exposed muscle 03/17/18-she is here in follow-up evaluation for ulcerations to the right lower extremity. She is voicing no complaints or concerns. She tolerated debridement. Puraply#7 placed 03/24/18; difficult right lower extremity ulcerations. PuraPly #8 place. She is been approved  for TheraSkins. She did very well with Apligraf today however she is apparently reached her "lifetime max" 03/31/18; marginal improvement with PuraPly although her wounds looked as good as they have in several weeks today. Used TheraSkins #1 04/14/18 TheraSkin #2 today 04/28/18 TheraSkins #3. Wound slightly improved 05/12/18; TheraSkin skin #4. Wound response has been variable. 05/27/18 TheraSkin #5. Generally improvement in all wound areas. I've also put her in 3 layer compression to help with the severe venous hypertension 06/09/18; patient is done quite well with the TheraSkins unfortunately we have no further applications. I also put her in 3 layer compression last week and that really seems to of helped. 06/16/18; we have been using silver collagen. Wounds are smaller. Still be open area to the muscle layer of her calf however even that is contracted somewhat. She tells me that at night sometimes she has pain on the right lateral calf at the site of her lower wound. Notable that I put her into 3 layer compression about 3 weeks ago. She states that she dangles her leg over the bed that makes it feel better but she does not describe claudication during the day She is going to call her secondary insurance to see if they will continue to cover advanced treatment products I have reviewed her  arterial studies from 01/22/17; this showed an ABI in the right of 1 and on the left noncompressible. TBI on the right at 0.30 on the left at 0.34. It is therefore possible she has significant PAD with medial calcification falsely elevating her ABI into the normal range. I'll need to be careful about asking her about this next week it's possible the 3 layer compression is too much 06/23/18; was able to reapply TheraSkin 1 today. Edema control is good and she is not complaining of pain no claudication 07/07/18;no major change. New wound which was apparently a taper removal injury today in our clinic between her 2 wounds on the right calf TheraSkins #2 07/14/18; I think there is some improvement in the right lateral ankle and the medial part of her wound. There is still exposed muscle medially. 07/28/18; two-week follow-up. TheraSkins#3. Unfortunately no major change. She is not a candidate I don't think for skin grafting due to severe venous hypertension associated with her scleroderma and pulmonary hypertension 08/11/18 Patient is here today for her [place] application #24 (#5 of the second set). She seems to be doing well and in the base of the wound appears to show some progress at this point. This is the last approved Theraskin of the second set. 08/25/18; she has completed TheraSkin. There has been some improvement on the right lateral calf wound as well as the anterior leg wounds. The open area to muscle medially on the anterior leg wound is smaller. I'm going to transition her back to [facility] under Kerlix Coban change every second day. She reports that she had some calcification removed from her right upper arm. We have had previous problems with calcifications in her wounds on her legs but that has not happened recently 09/08/18;using Hydrofera Blue on both her wound areas. Wounds seem to of contracted nicely. She uses Kerlix Coban wrap and changes at home herself 09/22/2018; using  Hydrofera Blue on both her wound areas. Dimensions seem to have come down somewhat. There is certainly less depth in the medial part of the mid tibia wound and I do not think there is any exposed muscle at this point. 10/06/2018; 2-week follow-up. Using [facility] on  her wound areas. Dimensions have come down nicely both on the right lateral ankle area in the right mid tibial area. She has no new complaints 10/20/2018; 2-week follow-up. She is using Hydrofera Blue. Not much change from the last time she was here. The area on the lateral ankle has less depth although it has raised edges on one side. I attempted to remove as much of the raised edge as I could without creating more additional wounds. The area on the right anterior mid tibia area looks the same. 11/03/2018; 2-week follow-up she is using Hydrofera Blue. On the right anterior leg she now has 2 wounds separated by a large area of normal skin. The area on the medial part still has I think exposed muscle although this area itself is a lot smaller. The area laterally has some depth. Both areas with necrotic debris. The area on her right lateral ankle has come in nicely 11/17/2018; patient continues to use Hydrofera Blue. We have been increasing separation of the 2 wounds anteriorly which were at one point joint the area on the right lateral calf continues to have I think some improvement in depth. 12/08/2018; patient continues to use Hydrofera Blue. There is some improvement in the area on the right lateral calf. The 2 areas that were initially part of the original large wound in the mid right tibia are probably about the same. In fact the medial area is probably somewhat larger. We will run puraply through the patient's insurance 12/22/2018; she has been using Hydrofera Blue. We have small wounds on the right lateral calf and 2 small areas that were initially part of a large wound in the right mid tibia. We applied pure apply #1  today. 12/29/2018; we applied puraply #2. Her wounds look somewhat better especially on the right lateral calf and the lateral part of her original wound in the mid tibial area. 01/05/2019; perhaps slightly improved in terms of wound bed condition but certainly not as much improvement as I might of liked. Puraply #3 1/13: we did not have a correct sized puraply to apply. wounds more pinched out looking, I increased her compression to 3 layer last week to help with significant multilevel venous hypertension. Since then I've reviewed her arterial status. She has a right femoral endarterectomy and a distal left SFA stent. She was being followed by Dr. [name] for a period however she does not appear to have seen him in 3 years. I will set up an appointment. 1/20. The patient has an additional wound on the right lateral calf between the distal wound and proximal wounds. We did not have Puraply last week. Still does not have a follow-up with Dr. early 1/27: Follow-up with Dr. early has been arranged apparently with follow-up noninvasive studies. Wounds are measuring roughly the same although they certainly look smaller 2/3; the patient had non invasive studies. Her ABIs on the right were 0.83 and on the left 1.02 however there was no great toe pressure bilaterally. Also worrisome monophasic waveforms at the PTA and dorsalis pedis. We are still using Puraply. We have had some improvement in all of the wounds especially the lateral part of the mid tibial area. 2/10; sees Dr. early of vein and vascular re-arterial studies next week. Puraply reapplied today. 2/17; Dr. early of vein and vascular his appointment is tomorrow. Puraply reapplied after debridement of all wounds 2/24; the patient saw Dr. early I reviewed his note. sHe noted the previous right femoral endarterectomy with a Dacron  patch. He also noted the normal ABI and the monophasic waveforms suggesting tibial disease. Overall he did not feel that  she had any evidence of arterial insufficiency that would impair her wound healing. He did note her venous disease as well. He suggested PRN follow-up. 3/2; I had the last puraply applied today. The original wounds over the mid tibia area are improved where is the area on the right lower leg is not 3/9; wounds are smaller especially in the right mid tibia perhaps slightly in the right lateral calf. We finished with puraply and went to endoform today 3/23; the patient arrives after 2 weeks. She has been using endoform. I think all of her wounds look slightly better which includes the area on the right lateral calf just above the right lateral malleolus and the 2 in the right mid tibia which were initially part of the same wound. 4/27; TELEHEALTH visit; the patient was seen for telehealth visit today with her consent in the middle of the worldwide epidemic. Since she was last here she called in for antibiotics with pain and tenderness around the area on the right medial ankle. I gave her empiric doxycycline. She states this feels better. She is using endoform on both of these areas 5/11 TELEHEALTH; the patient was seen for telehealth visit today. She was accompanied at home by her husband. She has severe pulmonary hypertension accompanied scleroderma and in the face of the Covid epidemic cannot be safely brought into our clinic. We have been using endoform on her wound areas. There are essentially 3 wound areas now in the left mid tibia now 2 open areas that it one point were connected and one on the right lateral ankle just above the malleolus. 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 She cannot come to clinic. The wounds were reviewed today via telehealth. She has severe chronic venous hypertension which I think is  centrally mediated. She has wounds on her right anterior tibia and right lateral ankle area. These are chronic. She has been using endoform. 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 We have been following her from telehealth. She has severe chronic venous hypertension which may be mostly centrally mediated secondary to right heart heart failure. She has wounds on her right anterior tibia and right lateral ankle these are chronic we have been using endoform 6/22; TELEHEALTH; this patient was seen today via telehealth. 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 Since the last time we had contact with this patient she developed some pain and erythema around the wound on her right lateral malleolus/ankle and we put in antibiotics for her. This is resulted in good improvement with resolution of the erythema and tenderness. I changed her to silver alginate last time. We had been using endoform for an extended period of time 7/13; TELEHEALTH; this patient was seen today via telehealth. 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 She would continue to be at a prohibitive risk to be brought into our clinic unless this was absolutely necessary. These visits have been done with her approval as well as her husband. We  have been using silver alginate to the areas on the right mid tibia and right lateral lower leg. 7/27 TELEHEALTH; patient was seen along with her husband today via telehealth. 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 We changed her to sample last visit. She has 2 areas a chronic wound on her right mid tibia and one just above her ankle. These were  not the original wounds when she came into this clinic but she developed them during treatment 8/17; she comes in for her first face-to-face visit in a long period. She has a remaining area just medial to the right tibia which is the last open part of her large wound across this area. She also has an area on the right lateral lower leg. We prescribed Santyl last telehealth visit but they were concerned that this was making a deeper so they put silver alginate on it last week. Her husband changes the dressings. 8/31; using Santyl to the 2 wound areas some improvement in wound surfaces. Husband has surgery in 2 weeks we will put her out 3 weeks. Any of the advanced treatment options that I can think of that would be eligible for this wound would also cause her to have to come in weekly. The risk that the patient is just too high 9/21. Using Santyl to the 2 wound areas. Both of these are somewhat better although the medial mid tibia area still has exposed muscle. Lateral ankle requiring debridement. Using Santyl 10/12; using Santyl to the 2 wound areas. One on the right lateral ankle and the other in the medial calf which still has exposed muscle. Both areas have come down in size and have better looking surfaces. She has made nice progress with santyl 11/2; we have been using Santyl to the 2 wound areas. Right lateral ankle and the other in the mid tibia area the medial part of this still has exposed muscle. 11/23/2019 on evaluation today patient appears to be doing about the same really with regard to her wounds. She is actually not very pleased with how things seem to be progressing at this point she tells me that she really has not noted much improvement unfortunately. With that being said there is no signs of active infection at this time. There is some slough buildup noted at this time which again along with some dry skin around the edges of the wound I think would benefit her to try to  debride some of this away. Fortunately her pain is doing fairly well. She still has exposed muscle in the right medial/tibial area. 12/14; TELEHEALTH; she was changed to [facility] to the right calf wound and right lateral ankle wound when she was here last time. Unfortunately since then she had a fall with a pelvic fracture and a fracture of her wrist. She was apparently hospitalized for 5 days. I have not looked at her discharge summary. She apparently came out of the hospital with a blister on her right heel. She was seen today via telehealth by myself and our case manager. The patient and her husband were present. She has been using Hydrofera Blue at our direction from the last time she was in the clinic. There is been no major improvement in fact the areas appear deeper and with a less viable surface 01/04/2020; TELEHEALTH; the patient was seen today in accompaniment of her husband and our nurse. She has 3 open  areas 1 on the right medial mid tibia, one on the right lateral ankle and a large eschared pressure area on the right heel. We have been using Iodoflex to the 2 original wounds. 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 It is simply too perilous for her to be seen in any other way 1/26; TELEHEALTH; the patient was seen today in accompaniment of her husband and our RN one of our nurses. She still has the 3 open areas 1 on the right medial mid tibia which is the remanent of a more extensive wound in this area, one on the right lateral ankle and a large eschared pressure area on the right heel. We have been using Iodoflex to the 2 original wounds and a bed at 9 application to the eschared area on the heel 2/15; the first time we have seen this patient and then several months out of concern for the pandemic. She had a large horizontal wound in the mid tibia. Only the medial aspect of this is still open. Area on the lateral ankle is just about closed.  She had a new pressure ulcer on the right heel which I have removed some of the eschar. We have been using Iodoflex which I will continue. The area in the mid tibia has a round circle in the middle of exposed muscle. I think we would have to use an advanced treatment product to stimulate granulation over this area. We will run this through her insurance. She is not eligible for plastic surgery for many reasons 3/1; TELEHEALTH. The patient was seen today by telehealth. 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 We have been using Iodoflex to the wound areas which include the right anterior mid tibia, right lateral ankle and the right heel. All of these were reviewed 3/8; the patient's wound just above the right ankle is closed. She still has a contracting black eschar on the heel where she had a pressure ulcer. The medial part of her original wound on the mid tibia has exposed muscle. We have made really made no progress in this area although we have managed to get a lot of the original wound in this area to close. We used Apligraf #1 today 3/22; the patient's ankle wound remains closed. She still has a contracting black eschar on the heel although it seems to have less surface area. It still not clear whether there is any depth here. We used Apligraf #2 today in an attempt to get granulation over the exposed muscle and what is left of her mid tibia area. Electronic Signature(s) Signed: 03/21/2020 5:34:39 PM By: Linton Ham MD Entered By: Linton Ham on 03/21/2020 10:58:41 -------------------------------------------------------------------------------- Physical Exam Details Patient Name: Date of Service: [name] [name], [name] F. 03/21/2020 9:00 AM Medical Record Number:[id number] Patient Account Number: [id number] Date of Birth/Sex: Treating RN: 1951/06/09 (69 y.o. [name] Primary Care  Provider: [name] Other Clinician: Referring Provider: Treating Provider/Extender:Nadir Vasques, Leotis Shames, MARY Weeks in Treatment: [phone] Constitutional Patient is hypertensive.. Pulse regular and within target range for patient.Marland Kitchen Respirations regular, non-labored and within target range.. Temperature is normal and within the target range for the patient.Marland Kitchen Appears in no distress. Notes Wound exam The area on the mid tibia portion still has the same amount of exposed muscle although you can see where the surrounding granulation tissue has responded to the Apligraf. Using a #3 curette I gently debrided the  circumference of the wound as well as the surface muscle which had debris on the surface. Hemostasis with direct pressure. The right heel is certainly smaller in terms of surface area although there is not a lot of certainty about the depth here. Electronic Signature(s) Signed: 03/21/2020 5:34:39 PM By: Linton Ham MD Entered By: Linton Ham on 03/21/2020 10:59:48 -------------------------------------------------------------------------------- Physician Orders Details Patient Name: Date of Service: [name] [name] 03/21/2020 9:00 AM Medical Record Number:[id number] Patient Account Number: [id number] Date of Birth/Sex: Treating RN: 1951-09-26 (69 y.o. 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 Dressing Change Frequency Wound #16 Right Calcaneus Change Dressing every other day. Wound #5 Right,Medial Lower Leg Change Dressing every other day. - ***DO NOT REMOVE STERI STRIPS - CHANGE OUTSIDE BANDAGE ONLY*** Skin Barriers/Peri-Wound Care Moisturizing lotion - both legs Wound Cleansing May shower and wash wound with soap and water. - on days that dressing is changed Primary Wound Dressing Wound #16 Right Calcaneus Other: - paint with betadine Wound #5 Right,Medial Lower Leg Skin Substitute Application - Apligraf #2 Secondary Dressing Wound #16 Right Calcaneus Dry Gauze Heel Cup Wound #5 Right,Medial Lower Leg Adaptic Dressing - secure with steri strips Dry Gauze Edema Control Kerlix and Coban - Right Lower Extremity Avoid standing for long periods of time Elevate legs to the level of the heart or above for 30 minutes daily and/or when sitting, a frequency of: - throughout the day Off-Loading Turn and reposition every 2 hours Other: - float heels off of bed/chair with pillow under calves Electronic Signature(s) Signed: 03/21/2020 5:18:21 PM By: Levan Hurst RN, BSN Signed: 03/21/2020 5:34:39 PM By: Linton Ham MD Entered By: Levan Hurst on 03/21/2020 10:00:17 -------------------------------------------------------------------------------- Problem List Details Patient Name: Date of Service: [name] [name] 03/21/2020 9:00 AM Medical Record Number:[id number] Patient Account Number: [id number] Date of Birth/Sex: Treating RN: Dec 15, 1951 (69 y.o. 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 02/24/2015 No Yes of calf and inflammation I87.331 Chronic venous hypertension (idiopathic) with ulcer 10/04/2016 No Yes and inflammation of right lower extremity L89.616 Pressure-induced deep tissue damage of right heel 12/14/2019 No Yes Inactive Problems ICD-10 Code Description Active Date Inactive Date 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 [name] Primary Care Provider: [name] Other Clinician: Referring Provider: Treating Provider/Extender:Chesley Valls, Leotis Shames, MARY Weeks in Treatment: 417 Subjective History of Present Illness (HPI) this is a patient who initially came to [place] for wounds on the medial malleoli bilaterally as well as her upper medial lower extremities bilaterally. These wounds eventually healed with assistance of Apligraf's bilaterally. While this was occurring she developed the current wound which opened into a fairly substantial wound on the right lower extremity. These are mostly secondary to venous stasis physiology however the patient also has underlying scleroderma, pulmonary hypertension. The wound has been making good progress lately with the Hydrofera  Blue-based dressing. 03/17/2015; patient had a arterial evaluation a year ago. Her right ABI was 0.86 left was 1.0. Toe brachial index was 0.41 on the right 0.45 on the left. Her bilateral common femoral artery waveforms were triphasic. Her white popliteal posterior tibial artery and anterior tibial artery waveforms were monophasic with good amplitude. Luteal artery waveforms were biphasic it was felt that her bilateral great toe pressures are of normal although adequate for tissue healing. 03/24/2015. The condition of this wound is not really improved that. He was covered with as fibrinous surface slough and eschar. This underwent an aggressive debridement with both a curette and scalpel. I still cannot really get down to what I can would consider to be a viable surface. There is no evidence of infection. Previous workup for ischemia roughly a year ago was negative nevertheless I think that continues to be a concern 04/07/15. The patient arrived for application of second Apligraf. Once again the surface of this wound is certainly less viable than I would like for an advanced treatment option. An aggressive debridement was done. She developed some arteriolar bleeding which required that along pressure and silver alginate. 04/21/15: change in the condition of this wound. Once again it is covered in a gelatinous surface slough. After debridement today surface of the wound looks somewhat better but now a heeling surface 05/05/15 Apligraf #4 applied.Still a lot of slough on this wound. 05/19/15 Apligraf #5 applied. Still a lot of slough on this wound I did not aggressively remove this 06/02/15; continued copious amounts of surface slough. This debridement fairly easily. 06/08/2015 -- the last time she had a venous duplex study done was over 3 years ago and the surgery was prior to that. I have recommended that she sees Dr. early for a another opinion regarding a repeat venous duplex and possibly more  endovenous ablation of vein stripping of micro-phlebotomies. 06/16/15; wound has a gelatinous surface eschar that the debridement fairly easily to a point. I don't disagree with the venous workup and perhaps even arterial re-evaluation. She is on prednisone 5 mg and continue his medications for her pulmonary artery hypertension I am not sure if the latter have any wound care healing issues I would need to investigate this. 06/23/15 continues with a gelatinous surface eschar with of fibrinous underlying. What I can see of this wound does not look unhealthy however I just can't get this material which I think is 2 different layers off. Empiric culture done 06/30/15; unfortunately not a lot of change in this wound. A gelatinous surface eschar is easily removed however it has a tight fibrinous surface underneath the. 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 The wound comes back and basically and unchanged state the. She has a gelatinous surface that is more easily removed  however there is a tightly fibrinous surface underneath the. There is no evidence of infection. She has a vascular follow-up next month. I would have to inject her in order to do a more aggressive debridement of this area 07/21/15 the wound is roughly in the same state albeit the debridement is done with greater ease. There is less of the fibrinous eschar underneath the. There is no evidence of infection. She has follow-up with vascular surgery next week. No evidence of surrounding infection. Her original distal wounds healed while this one formed. 07/28/15; wound is easier to debride. No wound erythema. She is seeing Dr. [name] next week. 08/18/15 Has been to [place] for repeat ablation. Have been using medihoney pad with some improvement 08/25/15; absolutely no change in the condition of this wound in either its overall size or surface condition in many months now. At one point I had this down to [place] healthier  surface I think with [facility] however this did not actually progress towards closure. Do not believe that the wound has actually deteriorated in terms of volume at all. We have been using a medihoney pad which allows easier debridement of the gelatinous eschar but again no overall actual improvement. the patient is going towards an ablation with pain and vascular which I think is scheduled for next week. The only other investigation that I could first see would be a biopsy. She does have underlying scleroderma 09/02/15 eschar is much easier to debridement however the base of this does not look particularly vibrant. We changed to Iodoflex. The patient had her ablation earlier this week 09/09/15 again the debridement over the base of this wound is easier and the base of the wound looks considerably better. We will continue the Iodoflex. Dr. Tawni Millers has expressed his satisfaction with the result of her ablation 09/15/15 once again the wound is relatively free of surface eschar. No debridement was done today. It has a pale- looking base to it. although this is not as deep as it once was it seems to be expanding especially inferiorly. She has had recent venous ablation but this is no closer to healing.I've gone ahead and done a punch biopsy this from the inferior part of the wound close to normal skin 09/22/15: the wound is relatively free of surface eschar. There is some surrounding eschar. I'm not exactly sure at what level the surface is that I am seeing. 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 No evidence of atypical infection, malignancy. 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 No debridement was done 09/29/15; the wound surface is relatively free of surface eschar. There is eschar surrounding the walls of the wound. Aside from the improvement in the amount of surface slough. This wound has not progressed any towards closure. There is not  even a surface that looks like there at this is ready. There is no evidence of any infection nor maligancy based on biopsy I did on 9/15. I continued with the Iodoflex however I am looking towards some alternative to try to promote some closure or filling in of this surface. Consider triple layer Oasis. Collagen did not result in adequate control of the surface slough 10/13/15; the patient was in hospital last week with severe anemia. The wound looks somewhat better after debridement although there is widening medially. There is no evidence of infection. 10/20/15; patient's wound on the right lateral lower leg is essentially unchanged. This underwent a light surface debridement and in general  the debridement is easier and the surface looks improved. I noted in doing this on the side of the wound what appears to be a piece of calcium deposition. The patient noted that she had previous things on the tips of her fingers. 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 She follows with rheumatology and I have asked them to talk to her about this. In view of that S the nonhealing ulcer may have something to do with calcinosis and also unrelieved Raynaud's disease in this area. I should note that her original wounds on the right and left medial malleolus and the inner aspects of both legs just below the knees did however heal over 10/30/15; the patient's wound on the right lateral lower leg is essentially unchanged. I was able to remove some calcified material from the medial wound edge. 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 Otherwise the wound appears essentially unchanged there is less adherent surface eschar. Some of the calcified material was sent to pathology for analysis 11/17/15. The patient's wound on the right lateral leg is essentially unchanged. Wider  Medially. 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 There is some surface slough but I did not debridement this. No evidence of surrounding infection. 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 This will make healing difficult 12/15/15; the wound has less depth surface has a fibrinous slough and calcifications in the wound edge no evidence of infection 12/22/15; the wound definitely has less Fibrinous slough on the base. Calcifications around the wound edges are still evident. Although the wound bed looks healthier it is still pale in appearance. Previous biopsy did not show malignancy 01/04/15; surgical debridement of nonviable slough and subcutaneous tissue the wound cleans up quite nicely but appears to be expanding outward calcifications around the wound edges are still there. 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 She is to see her rheumatologist I'll see if he has any opinions on this. My punch biopsy done in September did not show calcifications although these are clearly evident. 01/19/16 light selective debridement of nonviable surface slough. There is epithelialization medially. This gives me reason for cautious optimism. She has been to see her rheumatologist, there is nothing that can be done for this type of soft tissue calcification associated with scleroderma 02/02/16 no debridement although there is a light surface slough. She has 2 peninsulas of skin 1 inferiorly and one medially. We continue to make a slight and slow but definite progressive here 02/16/16; light surface debridement with more attention to the circumference of the wound bed where the fibrinous eschar is more  prevalent. No calcifications detected. She seems to have done nicely with the [facility] with some epithelialization and some improvement in the overall wound volume. 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 Continued attention to the circumference of the wound where the fibrinous eschar in calcinosis or prevalent. No calcifications were detected. She has continued improvement with Hydrofera Blue. The wound is no longer as deep 03/15/16 surgical debridement done to  remove surface escha Especially around the circumference of the wound where there is nonviable subcutaneous tissue. In spite of this there is considerable improvement in the overall dimensions and depth of the wound. Islands of epithelialization are seen especially medially inferiorly and superiorly to a lesser extent. She is using Hydrofera Blue at home 03/29/16; surgical debridement done to remove surface eschar and nonviable subcutaneous tissue. This cleans up quite nicely mention slightly larger in terms of length and width however depth is less 04/12/16; continued gradual improvement in terms of depth and the condition of the wound base. Debridement is done. Continuing long standing Hydrofera Blue at home with Kerlix Coban wraps 04/26/16; continued gradual improvement in terms of depth and management as well as condition of the wound base. Surgical debridement done she continues with Hydrofera Blue. 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 She initially came to this clinic venous insufficiency ulcers which have long since healed 05/17/16 continued improvement in terms of the depth and measurements of this wound although she has a tightly adherent fibrinous slough each time. We've been continued with long standing Hydrofera Blue which seems to done as  well for this wound is many advanced treatment options. 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 She also has chronic venous insufficiency. She has an irritation on her lateral right ankle secondary to our wraps 05/10/16; wound appears to be smaller especially on the medial aspect and especially in the width. Wound was debridement surface looks better. She is also been in the hospital apparently with anemia again she tells me she had an endoscopy. Since she got home after 3 days which I believe was sometime last week she has had an irritated painful area on the right lateral ankle surrounding the lateral malleolus 05/31/16; much more adherent surface slough today then recently although I don't think the dimensions of changed that much. A more aggressive debridement is required. The irritated area over her medial malleolus is more pruritic and painful and I don't think represents cellulitis 06/14/16; no major change in her wound dimensions however there is more tightly adherent surface slough which is disappointing. As well as she appears to have a new small area medially. Furthermore an irritated uncomfortable area on the lateral aspect of her right foot just below the lateral malleolus. 06/21/16; I'm seeing the patient back in follow-up for the new areas under her major wound on the right anterior leg. She has been using Hydrofera Blue to this area probably for several months now and although the dressings seem to be helping for quite a period of time I think things have stagnated lately. She comes in today with a relatively tight adherent surface slough and really no changes in the wound shape or dimension. The 2 small areas she had inferiorly are tiny but still open they seem improved this well. There is no uncontrolled edema and I don't think there is any evidence of cellulitis. 07/05/16; no major change in this lady's large anterior right leg wound which I think is  secondary to calcinosis which in turn is related to scleroderma. Patient has had vascular evaluations both venous and arterial. I have biopsied this area. There is no obvious infection. The worrisome thing today is that she seems to be developing areas of erythema and epithelial damage on the medial aspect of the right foot. Also to a lesser degree inferior to the actual wound itself. Again I see no obvious  changes to suggest cellulitis however as this is the only treatable option I will probably give her antibiotics. 07/13/16 no major change in the lady's large anterior right leg wound. Still covered with a very tightly adherent surface slough which is difficult to debridement. There is less erythema around this, culture last week grew pseudomonas I gave her ciprofloxacin. The area on her lateral right malleolus looks better- 08/02/16 the patient's wounds continued to decline. Her original large anterior right leg wound looks deeper. Still adherent surface slough that is difficult to debridement. She has a small area just below this and a punched-out wound just below her lateral malleolus. In the meantime she is been in hospital with apparently an upper GI bleed on Plavix and aspirin. She is now just on Plavix she received 3 units of packed cells 08/23/16; since I last saw this 3 weeks ago, the open large area on her right leg looks about the same syrup. She has a small satellite lesion just underneath this. The area on her medial right ankle is now a deep necrotic wound. I attempted to debridement this however there is just too much pain. 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 She follows with vascular surgery and is scheduled for an angiogram in early September 09/06/16; the patient is going for an arteriogram tomorrow. Her original large wound on the right calf is about the same the satellite lesion underneath it is about  the same however the area on her medial ankle is now deeper with exposed tendon. I am no longer attempting to debride these wounds 09/20/16; the patient has undergone a right femoral endarterectomy and Dacron patch angioplasty. This seems to have helped the flow in her right leg. 10/04/16; Arrived today for aggressive debridement of the wounds on her right calf the original wound the one beneath it and a difficult area over her right lateral leg just above the lateral malleolus. 10/25/16; her 3 open wounds are about the same in terms of dimensions however the surface appears a lot healthier post debridement. Using Iodoflex 10/18/16 we have been using Iodoflex to her wounds which she tolerated with some difficulty. 10/11/16; has been using Santyl for a period of time with some improvement although again very adherent surface slough would prevent any attempted healing this. She has a original wound on the left calf, the satellite underneath that and the most recent wound on the right medial ankle. She has completed revascularization by Dr. early and has had venous ablation earlier. Want to go back to Iodoflex to see if week and get a healthier surface to this wound bed failing this I think she'll need to be taken to the OR and I am prepared to call Dr. Marla Roe to discuss this. She is obviously not a good candidate for general anesthesia however.; 11/08/16; I put her on Iodoflex last time to see if I can get the wound bed any healthier and unfortunately today still had tremendous surface slough. 11/15/16; 4 weeks' worth of Iodoflex with not much improvement. Debridement on the major wounds on her left anterior leg is easier however this does not maintain from week to week. The punched-out area on her medial right ankle 11/29/16; I attempted to change the patient last visit to [facility] however she states this burned and was very uncomfortable therefore we gave her permission to go back to the  eye out of complex which she already  had at home. Also she noted a lot of pain and swelling on the lateral aspect of her leg before she traveled to [facility] for the holidays, I called her in doxycycline over the phone. This seems to have helped 12/06/16; Wounds unchanged by in large. Using Iodoflex 12/13/16; her wounds today actually looks somewhat better. The area on the right lateral lower leg has reasonably healthy-looking granulation and perhaps as actually filled and a bit. Debridement of the 2 wounds on the medial calf is easier and post debridement appears to have a healthier base. We have referred her to Dr. Migdalia Dk for consideration of operative debridement 12/20/16; we have a quick note from Dr. Merri Ray who feels that the patient needs to be referred to an academic center/plastic surgeon. This is due to the complexity of the patient's medical issues as it applies to anything in the OR. We have been using Iodoflex 12/27/16; in general the wounds on the right leg are better in terms of the difficult to remove surface slough. She has been using Iodoflex. She is approved for Apligraf which I anticipated ordering in the next week or 2 when we get a better-looking surface 01/04/16 the deep wounds on the right leg generally look better. Both of them are debrided further surface slough. The area on the lateral right leg was not debrided. She is approved for Apligraf I think I'll probably order this either next week or the week after depending on the surface of the wounds superiorly. We have been using Iodoflex which will continue until then 01/11/16; the deep wounds on the right leg again have a surface slough that requires debridement. I've not been able to get the wound bed on either one of these wounds down to what would be acceptable for an advanced skin stab- like Apligraf. The area on the medial leg has been improving. We have been using hot Iodoflex to all wounds  which seems to do the best at at least limiting the nonviable surface 01/24/17; we have continued Iodoflex and all her wound areas. Her debridement Gen. he is easier and the surface underneath this looks viable. Nevertheless these are large area wounds with exposed muscle at least on the anterior parts. We have ordered Apligraf's for 2 weeks from now. The patient will be away next week 02/07/17; the patient was close to have first Apligraf today however we did not order one. I therefore replaced her Iodoflex. She essentially has 3 large punched-out areas on her right anterior leg and right medial ankle. 02/11/17; Apligraf #1 02/25/17; Apligraf #2. In general some improvement in the right medial ankle and right anterior leg wounds. The larger wound medially perhaps some better 03/11/17; Apligraf #3. In general continued improvement in the right medial ankle and the right anterior leg wounds. 03/25/17 Apligraf #4. In general continued improvement especially on the right medial ankle and the lower 04/08/17; Apligraf #5 in general continued improvement in all wound sites. 04/22/17; post Apligraf #5 her wound beds continued to look a lot better all of them up to the surface of the surrounding skin. Had a caramel-colored slough that I did not debrided in case there is residual Apligraf effect. The wounds are as good as I have seen these looking quite some time. 04/29/17; we applied [place] and last week after completing her Apligraf. Wounds look as though they've contracted somewhat although they have a nonviable surface which was problematic in the past. Apparently she has been approved for further Apligraf's.  We applied Iodosorb today after debridement. 05/06/17; we're fortunate enough today to be able to apply additional Apligraf approved by her insurance. In general all of her wounds look better Apligraf #6 05/24/17; Apligraf #7 continued improvement in all wounds o3 06/10/17 Apligraf #8. Continued  improvement in the surface of all wounds. Not much of an improvement in dimensions as I might a follow 06/24/17 Apligraf #9 continued general improvement although not as much change in the wound areas I might of like. She has a new open area on the right anterior lateral ankle very small and superficial. She also has a necrotic wound on the tip of her right index finger probably secondary to severe uncontrolled Raynaud's phenomenon. She is already on sildenafil and already seen her rheumatologist who gave her Keflex. 07/08/17; Apligraf #10. Generalized improvement although she has a small additional wound just medial to the major wound area. 07/22/17; after discussion we decided not to reorder any further Apligraf's although there is been considerable improvement with these it hasn't been recently. The major wound anteriorly looks better. Smaller wounds beneath this and the more recent one and laterally look about the same. The area on the right lateral lower leg looks about the same 07/29/17 this is a patient who is exceedingly complex. She has advanced scleroderma, crest syndrome including calcinosis, PAD status post revascularization, chronic venous insufficiency status post ablations. She initially presented to this clinic with wounds on her bilateral lower legs however these closed. More recently we have been dealing with a large open area superiorly on the right anterior leg, a smaller wound underneath this and then another one on the right medial lower extremity. These improve significantly with 10 Apligraf applications. Over the last 2-3 weeks we are making good progress with Hydrofera Blue and these seem to be making progress towards closure 08/19/17; wounds continued to make good improvement with Hydrofera Blue and episodic aggressive debridements 08/26/17; still using Hydrofera Blue. Good improvements 09/09/17; using Hydrofera Blue continued improvement. Area on the lateral part of her right  leg has only a small remaining open area. The small inferior satellite region is for all intents and purposes closed. Her major wound also is come in in terms of depth and has advancing epithelialization. 09/16/17; using Hydrofera Blue with continued improvement. The smaller satellite wound. We've closed out today along with a new were satellite wound medially. The area on her medial ankle is still open and her major wound is still open but making improvement. All using Hydrofera Blue. Currently 09/30/17; using Hydrofera Blue. Still a small open area on the lateral right ankle area and her original major wound seems to be making gradual and steady improvement. 10/14/17; still using Hydrofera Blue. Still too small open areas on the right lateral ankle. Her original major wound is horizontal and linear. The most problematic area paradoxically seems to be the area to the medial area wears I thought it would be the lateral. 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 10/28/17; still using Hydrofera Blue. Right lateral ankle has a very small open area superiorly on the most lateral part of the wound. Her original open wound has 2 open areas now separated by normal skin and we've redefined this. 11/11/17; still using Hydrofera Blue area and right lateral ankle continues to have a small open area on mostly the lateral part of the wound. Her original wound has 2 small open areas now separated by a considerable amount of  normal skin 11/28/17; the patient called in slightly before Thanksgiving to report pain and erythema above the wound on the right leg. In the past this is responded well to treatment for cellulitis and I gave her over the phone doxycycline. She stated this resulted in fairly abrupt improvement. We have been using Hydrofera Blue for a prolonged period of time to the larger wound anteriorly into the remaining wound on her right right lateral  ankle. The latter is just about closed with only a small linear area and the bottom of the [place]. 12/02/17; use endoform and left the dressing on since last visit. There is no tenderness and no evidence of infection. 12/16/17; patient has been using Endoform but not making much progress. The 2 punched out open areas anteriorly which were the reminiscence of her major wound appear deeper. The area on the lateral aspect of her right calf also appears deeper. Also she has a puzzling tender swelling above her wound on the right leg. This seems larger than last time. Just above her wounds there appears to be some fluctuance in this area it is not erythematous and there is no crepitus 12/30/17; patient has been using Endoform up until last week we used Hydrofera Blue. Ultrasound of the swelling above her 2 major wounds last time was negative for a fluid collection. I gave her cefaclor for the erythema and tenderness in this area which seems better. Unfortunately both punched out areas anteriorly and the area on her right medial lower leg appear deeper. In fact the lateral of the wounds anteriorly actually looks as though it has exposed tendon and/or muscle sheath. She is not systemically unwell. She is complaining of vaginitis type symptoms presumably Candida from her antibiotics. 01/06/18; we're using santyl. she has 2 punched out areas anteriorly which were initially part of a large wound. Unfortunately medially this is now open to tendon/muscle. All the wounds have the same adherent very difficult to debride surface. 01/20/18; 2 week follow-up using Santyl. She has the 2 punched out areas anteriorly which were initially part of her large surface wound there. Medially this still has exposed muscle. All of these have the same tightly adherent necrotic surface which requires debridement. PuraPly was not accepted by the patient's insurance however her insurance I think it changed therefore we are going to  run Apligraf to gain 02/03/18; the patient has been using Santyl. The wound on the right lateral ankle looks improved and the 2 areas anteriorly on the right leg looks about the same. The medial one has exposed muscle. The lateral 1 requiresdebridement. We use PuraPly today for the first week 02/10/18; PuraPly #2. The patient has 3 wounds. The area on the right lateral ankle, 2 areas anteriorly that were part of her original large wound in this area the medial one has exposed muscle. All of the wounds were lightly debrided with a number 3 curet. PuraPly #2 applied the lateral wound on the calf and the right lateral ankle look better. 02/17/18; PuraPly #3. Patient has 3 wounds. The area on the right lateral ankle in 2 areas internally that were part of her original large wound. The lateral area has exposed muscle. She arrived with some complaints of pain around the right ankle. 02/24/18; PuraPly #4; not much change in any of the 3 wound areas. Right lateral ankle, right lateral calf. Both of these required debridement with a #3 curet. She tolerates this marginally. The area on the medial leg still has exposed muscle. Not  much change in dimensions 03/03/18 PuraPly #5. The area on the medial ankle actually looks better however the 2 separate areas that were original parts of the larger right anterior leg wound look as though they're attempting to coalesce. 03/10/18; PuraPly #6. The area on the medial ankle actually continues to look and measures smaller however the 2 separate areas that were part of the original large wound on the right anterior leg have now coalesced. There hasn't been much improvement here. The lateral area actually has underlying exposed muscle 03/17/18-she is here in follow-up evaluation for ulcerations to the right lower extremity. She is voicing no complaints or concerns. She tolerated debridement. Puraply#7 placed 03/24/18; difficult right lower extremity ulcerations. PuraPly #8 place.  She is been approved for [employer]. She did very well with Apligraf today however she is apparently reached her "lifetime max" 03/31/18; marginal improvement with PuraPly although her wounds looked as good as they have in several weeks today. Used TheraSkins #1 04/14/18 TheraSkin #2 today 04/28/18 TheraSkins #3. Wound slightly improved 05/12/18; TheraSkin skin #4. Wound response has been variable. 05/27/18 TheraSkin #5. Generally improvement in all wound areas. I've also put her in 3 layer compression to help with the severe venous hypertension 06/09/18; patient is done quite well with the TheraSkins unfortunately we have no further applications. I also put her in 3 layer compression last week and that really seems to of helped. 06/16/18; we have been using silver collagen. Wounds are smaller. Still be open area to the muscle layer of her calf however even that is contracted somewhat. She tells me that at night sometimes she has pain on the right lateral calf at the site of her lower wound. Notable that I put her into 3 layer compression about 3 weeks ago. She states that she dangles her leg over the bed that makes it feel better but she does not describe claudication during the day ooShe is going to call her secondary insurance to see if they will continue to cover advanced treatment products I have reviewed her arterial studies from 01/22/17; this showed an ABI in the right of 1 and on the left noncompressible. TBI on the right at 0.30 on the left at 0.34. It is therefore possible she has significant PAD with medial calcification falsely elevating her ABI into the normal range. I'll need to be careful about asking her about this next week it's possible the 3 layer compression is too much 06/23/18; was able to reapply TheraSkin 1 today. Edema control is good and she is not complaining of pain no claudication 07/07/18;no major change. New wound which was apparently a taper removal injury today in our  clinic between her 2 wounds on the right calf TheraSkins #2 07/14/18; I think there is some improvement in the right lateral ankle and the medial part of her wound. There is still exposed muscle medially. 07/28/18; two-week follow-up. TheraSkins#3. Unfortunately no major change. She is not a candidate I don't think for skin grafting due to severe venous hypertension associated with her scleroderma and pulmonary hypertension 08/11/18 Patient is here today for her [employer] application #07 (#5 of the second set). She seems to be doing well and in the base of the wound appears to show some progress at this point. This is the last approved [employer]in of the second set. 08/25/18; she has completed TheraSkin. There has been some improvement on the right lateral calf wound as well as the anterior leg wounds. The open area to muscle medially on  the anterior leg wound is smaller. I'm going to transition her back to [facility] under Kerlix Coban change every second day. She reports that she had some calcification removed from her right upper arm. We have had previous problems with calcifications in her wounds on her legs but that has not happened recently 09/08/18;using Hydrofera Blue on both her wound areas. Wounds seem to of contracted nicely. She uses Kerlix Coban wrap and changes at home herself 09/22/2018; using Hydrofera Blue on both her wound areas. Dimensions seem to have come down somewhat. There is certainly less depth in the medial part of the mid tibia wound and I do not think there is any exposed muscle at this point. 10/06/2018; 2-week follow-up. Using [facility] on her wound areas. Dimensions have come down nicely both on the right lateral ankle area in the right mid tibial area. She has no new complaints 10/20/2018; 2-week follow-up. She is using Hydrofera Blue. Not much change from the last time she was here. The area on the lateral ankle has less depth although it has raised edges on  one side. I attempted to remove as much of the raised edge as I could without creating more additional wounds. The area on the right anterior mid tibia area looks the same. 11/03/2018; 2-week follow-up she is using Hydrofera Blue. On the right anterior leg she now has 2 wounds separated by a large area of normal skin. The area on the medial part still has I think exposed muscle although this area itself is a lot smaller. The area laterally has some depth. Both areas with necrotic debris. ooThe area on her right lateral ankle has come in nicely 11/17/2018; patient continues to use Hydrofera Blue. We have been increasing separation of the 2 wounds anteriorly which were at one point joint the area on the right lateral calf continues to have I think some improvement in depth. 12/08/2018; patient continues to use Hydrofera Blue. There is some improvement in the area on the right lateral calf. The 2 areas that were initially part of the original large wound in the mid right tibia are probably about the same. In fact the medial area is probably somewhat larger. We will run puraply through the patient's insurance 12/22/2018; she has been using Hydrofera Blue. We have small wounds on the right lateral calf and 2 small areas that were initially part of a large wound in the right mid tibia. We applied pure apply #1 today. 12/29/2018; we applied puraply #2. Her wounds look somewhat better especially on the right lateral calf and the lateral part of her original wound in the mid tibial area. 01/05/2019; perhaps slightly improved in terms of wound bed condition but certainly not as much improvement as I might of liked. Puraply #3 1/13: we did not have a correct sized puraply to apply. wounds more pinched out looking, I increased her compression to 3 layer last week to help with significant multilevel venous hypertension. Since then I've reviewed her arterial status. She has a right femoral endarterectomy and a  distal left SFA stent. She was being followed by Dr. [name] for a period however she does not appear to have seen him in 3 years. I will set up an appointment. 1/20. The patient has an additional wound on the right lateral calf between the distal wound and proximal wounds. We did not have Puraply last week. Still does not have a follow-up with Dr. early 1/27: Follow-up with Dr. early has been arranged apparently  with follow-up noninvasive studies. Wounds are measuring roughly the same although they certainly look smaller 2/3; the patient had non invasive studies. Her ABIs on the right were 0.83 and on the left 1.02 however there was no great toe pressure bilaterally. Also worrisome monophasic waveforms at the PTA and dorsalis pedis. We are still using Puraply. We have had some improvement in all of the wounds especially the lateral part of the mid tibial area. 2/10; sees Dr. early of vein and vascular re-arterial studies next week. Puraply reapplied today. 2/17; Dr. early of vein and vascular his appointment is tomorrow. Puraply reapplied after debridement of all wounds 2/24; the patient saw Dr. early I reviewed his note. sHe noted the previous right femoral endarterectomy with a Dacron patch. He also noted the normal ABI and the monophasic waveforms suggesting tibial disease. Overall he did not feel that she had any evidence of arterial insufficiency that would impair her wound healing. He did note her venous disease as well. He suggested PRN follow-up. 3/2; I had the last puraply applied today. The original wounds over the mid tibia area are improved where is the area on the right lower leg is not 3/9; wounds are smaller especially in the right mid tibia perhaps slightly in the right lateral calf. We finished with puraply and went to endoform today 3/23; the patient arrives after 2 weeks. She has been using endoform. I think all of her wounds look slightly better which includes the area on the  right lateral calf just above the right lateral malleolus and the 2 in the right mid tibia which were initially part of the same wound. 4/27; TELEHEALTH visit; the patient was seen for telehealth visit today with her consent in the middle of the worldwide epidemic. Since she was last here she called in for antibiotics with pain and tenderness around the area on the right medial ankle. I gave her empiric doxycycline. She states this feels better. She is using endoform on both of these areas 5/11 TELEHEALTH; the patient was seen for telehealth visit today. She was accompanied at home by her husband. She has severe pulmonary hypertension accompanied scleroderma and in the face of the Covid epidemic cannot be safely brought into our clinic. We have been using endoform on her wound areas. There are essentially 3 wound areas now in the left mid tibia now 2 open areas that it one point were connected and one on the right lateral ankle just above the malleolus. 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 She cannot come to clinic. The wounds were reviewed today via telehealth. She has severe chronic venous hypertension which I think is centrally mediated. She has wounds on her right anterior tibia and right lateral ankle area. These are chronic. She has been using endoform. 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 We have been following her from telehealth. She has severe chronic venous hypertension which may be mostly centrally mediated secondary to right heart heart failure. She has wounds on her right anterior tibia and right lateral ankle these are chronic we have been using endoform 6/22;  TELEHEALTH; this patient was seen today via telehealth. 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 Since the last time we had contact with this patient she developed some pain and erythema around the wound on her right lateral malleolus/ankle and we put in antibiotics for her. This is resulted in good improvement with resolution of the erythema and tenderness. I changed her to silver alginate last time. We had been using endoform for an extended period of time 7/13; TELEHEALTH; this patient was seen today via telehealth. 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 She would continue to be at a prohibitive risk to be brought into our clinic unless this was absolutely necessary. These visits have been done with her approval as well as her husband. We have been using silver alginate to the areas on the right mid tibia and right lateral lower leg. 7/27 TELEHEALTH; patient was seen along with her husband today via telehealth. 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 We changed her to sample last visit. She has 2 areas a chronic wound on her right mid tibia and one just above her ankle. These were not the original wounds when she came into this clinic but she developed them during treatment 8/17; she comes in for her first face-to-face visit in a long period. She has a remaining area just medial to the right tibia which is the last open part of her large wound across this area. She also has an area on the right lateral lower leg. We prescribed Santyl last telehealth visit but they were concerned that this was making a deeper so they put silver alginate on it last week. Her husband changes the dressings. 8/31; using Santyl to the 2 wound areas some improvement in wound surfaces. Husband has  surgery in 2 weeks we will put her out 3 weeks. Any of the advanced treatment options that I can think of that would be eligible for this wound would also cause her to have to come in weekly. The risk that the patient is just too high 9/21. Using Santyl to the 2 wound areas. Both of these are somewhat better although the medial mid tibia area still has exposed muscle. Lateral ankle requiring debridement. Using Santyl 10/12; using Santyl to the 2 wound areas. One on the right lateral ankle and the other in the medial calf which still has exposed muscle. Both areas have come down in size and have better looking surfaces. She has made nice progress with santyl 11/2; we have been using Santyl to the 2 wound areas. Right lateral ankle and the other in the mid tibia area the medial part of this still has exposed muscle. 11/23/2019 on evaluation today patient appears to be doing about the same really with regard to her wounds. She is actually not very pleased with how things seem to be progressing at this point she tells me that she really has not noted much improvement unfortunately. With that being said there is no signs of active infection at this time. There is some slough buildup noted at this time which again along with some dry skin around the edges of the wound I think would benefit her to try to debride some of this away. Fortunately her pain is doing fairly well. She still has exposed muscle in the right medial/tibial area. 12/14; TELEHEALTH; she was changed to [facility] to the right calf wound and right lateral  ankle wound when she was here last time. Unfortunately since then she had a fall with a pelvic fracture and a fracture of her wrist. She was apparently hospitalized for 5 days. I have not looked at her discharge summary. She apparently came out of the hospital with a blister on her right heel. She was seen today via telehealth by myself and our case manager. The patient and her  husband were present. She has been using Hydrofera Blue at our direction from the last time she was in the clinic. There is been no major improvement in fact the areas appear deeper and with a less viable surface 01/04/2020; TELEHEALTH; the patient was seen today in accompaniment of her husband and our nurse. She has 3 open areas 1 on the right medial mid tibia, one on the right lateral ankle and a large eschared pressure area on the right heel. We have been using Iodoflex to the 2 original wounds. 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 It is simply too perilous for her to be seen in any other way 1/26; TELEHEALTH; the patient was seen today in accompaniment of her husband and our RN one of our nurses. She still has the 3 open areas 1 on the right medial mid tibia which is the remanent of a more extensive wound in this area, one on the right lateral ankle and a large eschared pressure area on the right heel. We have been using Iodoflex to the 2 original wounds and a bed at 9 application to the eschared area on the heel 2/15; the first time we have seen this patient and then several months out of concern for the pandemic. She had a large horizontal wound in the mid tibia. Only the medial aspect of this is still open. Area on the lateral ankle is just about closed. She had a new pressure ulcer on the right heel which I have removed some of the eschar. We have been using Iodoflex which I will continue. The area in the mid tibia has a round circle in the middle of exposed muscle. I think we would have to use an advanced treatment product to stimulate granulation over this area. We will run this through her insurance. She is not eligible for plastic surgery for many reasons 3/1; TELEHEALTH. The patient was seen today by telehealth. 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  We have been using Iodoflex to the wound areas which include the right anterior mid tibia, right lateral ankle and the right heel. All of these were reviewed 3/8; the patient's wound just above the right ankle is closed. She still has a contracting black eschar on the heel where she had a pressure ulcer. The medial part of her original wound on the mid tibia has exposed muscle. We have made really made no progress in this area although we have managed to get a lot of the original wound in this area to close. We used Apligraf #1 today 3/22; the patient's ankle wound remains closed. She still has a contracting black eschar on the heel although it seems to have less surface area. It still not clear whether there is any depth here. We used Apligraf #2 today in an attempt to get granulation over the exposed muscle and what is left of her mid tibia area. Objective Constitutional Patient is hypertensive.. Pulse regular and within target range for patient.Marland Kitchen Respirations  regular, non-labored and within target range.. Temperature is normal and within the target range for the patient.Marland Kitchen Appears in no distress. Vitals Time Taken: 9:33 AM, Height: 68 in, Weight: 132 lbs, BMI: 20.1, Temperature: 98.3 F, Pulse: 69 bpm, Respiratory Rate: 18 breaths/min, Blood Pressure: 159/46 mmHg. General Notes: Wound exam ooThe area on the mid tibia portion still has the same amount of exposed muscle although you can see where the surrounding granulation tissue has responded to the Apligraf. Using a #3 curette I gently debrided the circumference of the wound as well as the surface muscle which had debris on the surface. Hemostasis with direct pressure. ooThe right heel is certainly smaller in terms of surface area although there is not a lot of certainty about the depth here. Integumentary (Hair, Skin) Wound #16 status is Open. Original cause of wound was Pressure Injury. The wound is located on the Right Calcaneus. The  wound measures 1.8cm length x 1.5cm width x 0.1cm depth; 2.121cm^2 area and 0.212cm^3 volume. There is no tunneling or undermining noted. There is a medium amount of serosanguineous drainage noted. The wound margin is flat and intact. There is no granulation within the wound bed. There is a large (67-100%) amount of necrotic tissue within the wound bed including Eschar. Wound #5 status is Open. Original cause of wound was Gradually Appeared. The wound is located on the Right,Medial Lower Leg. The wound measures 1.9cm length x 1.5cm width x 0.3cm depth; 2.238cm^2 area and 0.672cm^3 volume. There is muscle and Fat Layer (Subcutaneous Tissue) Exposed exposed. There is no tunneling or undermining noted. There is a medium amount of serosanguineous drainage noted. The wound margin is well defined and not attached to the wound base. There is medium (34-66%) pink granulation within the wound bed. There is a medium (34-66%) amount of necrotic tissue within the wound bed including Adherent Slough. 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 There was a Excisional Skin/Subcutaneous Tissue Debridement with a total area of 2.85 sq cm performed by Ricard Dillon., MD. With the following instrument(s): Curette to remove Viable and Non-Viable tissue/material. Material removed includes Subcutaneous Tissue and Slough and after achieving pain control using  Other (Benzocaine 20%). No specimens were taken. A time out was conducted at 10:00, prior to the start of the procedure. A Minimum amount of bleeding was controlled with Pressure. The procedure was tolerated well with a pain level of 0 throughout and a pain level of 0 following the procedure. Post Debridement Measurements: 1.9cm length x 1.5cm width x 0.3cm depth; 0.672cm^3 volume. Character of Wound/Ulcer Post Debridement is improved. Severity of Tissue Post Debridement is: Fat layer exposed. Post procedure Diagnosis Wound #5: Same as Pre-Procedure Pre-procedure diagnosis of Wound #5 is a Venous Leg Ulcer located on the Right,Medial Lower Leg. A skin graft procedure using a bioengineered skin substitute/cellular or tissue based product was performed by Ricard Dillon., MD. Apligraf was applied and secured with Steri-Strips. 22 sq cm of product was utilized and 22 sq cm was wasted  due to wound size. Post Application, gauze, adaptic was applied. A Time Out was conducted at 10:04, prior to the start of the procedure. The procedure was tolerated well with a pain level of 0 throughout and a pain level of 0 following the procedure. Post procedure Diagnosis Wound #5: Same as Pre-Procedure . Plan Follow-up Appointments: Return Appointment in 2 weeks. Dressing Change Frequency: Wound #16 Right Calcaneus: Change Dressing every other day. Wound #5 Right,Medial Lower Leg: Change Dressing every other day. - ***DO NOT REMOVE STERI STRIPS - CHANGE OUTSIDE BANDAGE ONLY*** Skin Barriers/Peri-Wound Care: Moisturizing lotion - both legs Wound Cleansing: May shower and wash wound with soap and water. - on days that dressing is changed Primary Wound Dressing: Wound #16 Right Calcaneus: Other: - paint with betadine Wound #5 Right,Medial Lower Leg: Skin Substitute Application - Apligraf #2 Secondary Dressing: Wound #16 Right Calcaneus: Dry Gauze Heel Cup Wound #5 Right,Medial Lower Leg: Adaptic  Dressing - secure with steri strips Dry Gauze Edema Control: Kerlix and Coban - Right Lower Extremity Avoid standing for long periods of time Elevate legs to the level of the heart or above for 30 minutes daily and/or when sitting, a frequency of: - throughout the day Off-Loading: Turn and reposition every 2 hours Other: - float heels off of bed/chair with pillow under calves 1. Apligraf #2 to the area on the right leg 2. Still using Betadine in the heel. I think there is some exposed wound underneath the eschar however she does not want me to remove this Electronic Signature(s) Signed: 03/21/2020 5:34:39 PM By: Linton Ham MD Entered By: Linton Ham on 03/21/2020 11:00:33 -------------------------------------------------------------------------------- SuperBill Details Patient Name: Date of Service: [name] [name] 03/21/2020 Medical Record Number:[id number] Patient Account Number: [id number] Date of Birth/Sex: Treating RN: 01/14/1951 (69 y.o. 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 I87.331 Chronic venous hypertension (idiopathic) with ulcer and inflammation of right lower extremity L89.616 Pressure-induced deep tissue damage of right heel Facility Procedures CPT4: Description Modifier Quantity Code [id number] (Facility Use Only) Apligraf 1 SQ CM 44 CPT4: [id number] - SKIN SUB GRAFT TRNK/ARM/LEG 1 ICD-10 Diagnosis Description I87.331 Chronic venous hypertension (idiopathic) with ulcer and inflammation of right lower extremity L97.213  Non-pressure chronic ulcer of right calf with necrosis of  muscle Physician Procedures CPT4: Code [id number] 15 Description: 53 - WC PHYS SKIN SUB GRAFT TRNK/ARM/LEG ICD-10 Diagnosis Description I87.331 Chronic venous hypertension (idiopathic) with ulcer and infla extremity L97.213 Non-pressure chronic ulcer of right calf with necrosis of mus Modifier: mmation of rig cle Quantity: 1 ht lower Electronic Signature(s) Signed: 03/21/2020 5:18:21 PM By: Levan Hurst RN, BSN Signed: 03/21/2020 5:34:39 PM By: Linton Ham MD Entered By: Levan Hurst on 03/21/2020 13:13:27

## 2020-03-23 DIAGNOSIS — S62102D Fracture of unspecified carpal bone, left wrist, subsequent encounter for fracture with routine healing: Secondary | ICD-10-CM | POA: Diagnosis not present

## 2020-03-23 DIAGNOSIS — G629 Polyneuropathy, unspecified: Secondary | ICD-10-CM | POA: Diagnosis not present

## 2020-03-23 DIAGNOSIS — S52592D Other fractures of lower end of left radius, subsequent encounter for closed fracture with routine healing: Secondary | ICD-10-CM | POA: Diagnosis not present

## 2020-03-23 DIAGNOSIS — I739 Peripheral vascular disease, unspecified: Secondary | ICD-10-CM | POA: Diagnosis not present

## 2020-03-23 DIAGNOSIS — S32592D Other specified fracture of left pubis, subsequent encounter for fracture with routine healing: Secondary | ICD-10-CM | POA: Diagnosis not present

## 2020-03-23 DIAGNOSIS — M349 Systemic sclerosis, unspecified: Secondary | ICD-10-CM | POA: Diagnosis not present

## 2020-03-28 DIAGNOSIS — E039 Hypothyroidism, unspecified: Secondary | ICD-10-CM | POA: Diagnosis not present

## 2020-03-28 DIAGNOSIS — F329 Major depressive disorder, single episode, unspecified: Secondary | ICD-10-CM | POA: Diagnosis not present

## 2020-03-28 DIAGNOSIS — I700 Atherosclerosis of aorta: Secondary | ICD-10-CM | POA: Diagnosis not present

## 2020-03-28 DIAGNOSIS — I87321 Chronic venous hypertension (idiopathic) with inflammation of right lower extremity: Secondary | ICD-10-CM | POA: Diagnosis not present

## 2020-03-28 DIAGNOSIS — D62 Acute posthemorrhagic anemia: Secondary | ICD-10-CM | POA: Diagnosis not present

## 2020-03-28 DIAGNOSIS — I77819 Aortic ectasia, unspecified site: Secondary | ICD-10-CM | POA: Diagnosis not present

## 2020-03-28 DIAGNOSIS — S62102D Fracture of unspecified carpal bone, left wrist, subsequent encounter for fracture with routine healing: Secondary | ICD-10-CM | POA: Diagnosis not present

## 2020-03-28 DIAGNOSIS — I672 Cerebral atherosclerosis: Secondary | ICD-10-CM | POA: Diagnosis not present

## 2020-03-28 DIAGNOSIS — M419 Scoliosis, unspecified: Secondary | ICD-10-CM | POA: Diagnosis not present

## 2020-03-28 DIAGNOSIS — M349 Systemic sclerosis, unspecified: Secondary | ICD-10-CM | POA: Diagnosis not present

## 2020-03-28 DIAGNOSIS — S32592D Other specified fracture of left pubis, subsequent encounter for fracture with routine healing: Secondary | ICD-10-CM | POA: Diagnosis not present

## 2020-03-28 DIAGNOSIS — M810 Age-related osteoporosis without current pathological fracture: Secondary | ICD-10-CM | POA: Diagnosis not present

## 2020-03-28 DIAGNOSIS — I8312 Varicose veins of left lower extremity with inflammation: Secondary | ICD-10-CM | POA: Diagnosis not present

## 2020-03-28 DIAGNOSIS — N183 Chronic kidney disease, stage 3 unspecified: Secondary | ICD-10-CM | POA: Diagnosis not present

## 2020-03-28 DIAGNOSIS — I2721 Secondary pulmonary arterial hypertension: Secondary | ICD-10-CM | POA: Diagnosis not present

## 2020-03-28 DIAGNOSIS — I2510 Atherosclerotic heart disease of native coronary artery without angina pectoris: Secondary | ICD-10-CM | POA: Diagnosis not present

## 2020-03-28 DIAGNOSIS — D631 Anemia in chronic kidney disease: Secondary | ICD-10-CM | POA: Diagnosis not present

## 2020-03-28 DIAGNOSIS — S52592D Other fractures of lower end of left radius, subsequent encounter for closed fracture with routine healing: Secondary | ICD-10-CM | POA: Diagnosis not present

## 2020-03-28 DIAGNOSIS — K8020 Calculus of gallbladder without cholecystitis without obstruction: Secondary | ICD-10-CM | POA: Diagnosis not present

## 2020-03-28 DIAGNOSIS — J9611 Chronic respiratory failure with hypoxia: Secondary | ICD-10-CM | POA: Diagnosis not present

## 2020-03-28 DIAGNOSIS — I129 Hypertensive chronic kidney disease with stage 1 through stage 4 chronic kidney disease, or unspecified chronic kidney disease: Secondary | ICD-10-CM | POA: Diagnosis not present

## 2020-03-28 DIAGNOSIS — I739 Peripheral vascular disease, unspecified: Secondary | ICD-10-CM | POA: Diagnosis not present

## 2020-03-28 DIAGNOSIS — E785 Hyperlipidemia, unspecified: Secondary | ICD-10-CM | POA: Diagnosis not present

## 2020-03-28 DIAGNOSIS — G629 Polyneuropathy, unspecified: Secondary | ICD-10-CM | POA: Diagnosis not present

## 2020-03-28 DIAGNOSIS — I6523 Occlusion and stenosis of bilateral carotid arteries: Secondary | ICD-10-CM | POA: Diagnosis not present

## 2020-03-29 DIAGNOSIS — S62102D Fracture of unspecified carpal bone, left wrist, subsequent encounter for fracture with routine healing: Secondary | ICD-10-CM | POA: Diagnosis not present

## 2020-03-29 DIAGNOSIS — S32592D Other specified fracture of left pubis, subsequent encounter for fracture with routine healing: Secondary | ICD-10-CM | POA: Diagnosis not present

## 2020-03-29 DIAGNOSIS — G629 Polyneuropathy, unspecified: Secondary | ICD-10-CM | POA: Diagnosis not present

## 2020-03-29 DIAGNOSIS — I739 Peripheral vascular disease, unspecified: Secondary | ICD-10-CM | POA: Diagnosis not present

## 2020-03-29 DIAGNOSIS — S52592D Other fractures of lower end of left radius, subsequent encounter for closed fracture with routine healing: Secondary | ICD-10-CM | POA: Diagnosis not present

## 2020-03-29 DIAGNOSIS — M349 Systemic sclerosis, unspecified: Secondary | ICD-10-CM | POA: Diagnosis not present

## 2020-04-04 ENCOUNTER — Other Ambulatory Visit: Payer: Self-pay

## 2020-04-04 ENCOUNTER — Encounter (HOSPITAL_BASED_OUTPATIENT_CLINIC_OR_DEPARTMENT_OTHER): Payer: Medicare Other | Attending: Internal Medicine | Admitting: Internal Medicine

## 2020-04-04 DIAGNOSIS — L97811 Non-pressure chronic ulcer of other part of right lower leg limited to breakdown of skin: Secondary | ICD-10-CM | POA: Diagnosis not present

## 2020-04-04 DIAGNOSIS — L97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: Secondary | ICD-10-CM | POA: Diagnosis not present

## 2020-04-04 DIAGNOSIS — I87331 Chronic venous hypertension (idiopathic) with ulcer and inflammation of right lower extremity: Secondary | ICD-10-CM | POA: Insufficient documentation

## 2020-04-04 DIAGNOSIS — L89616 Pressure-induced deep tissue damage of right heel: Secondary | ICD-10-CM | POA: Insufficient documentation

## 2020-04-04 DIAGNOSIS — L97812 Non-pressure chronic ulcer of other part of right lower leg with fat layer exposed: Secondary | ICD-10-CM | POA: Diagnosis not present

## 2020-04-04 DIAGNOSIS — I872 Venous insufficiency (chronic) (peripheral): Secondary | ICD-10-CM | POA: Diagnosis not present

## 2020-04-04 DIAGNOSIS — I83222 Varicose veins of left lower extremity with both ulcer of calf and inflammation: Secondary | ICD-10-CM | POA: Diagnosis not present

## 2020-04-04 NOTE — Progress Notes (Signed)
[name] [name], [name] [name] ([id number]) Visit Report for 04/04/2020 Cellular or Tissue Based Product Details Patient Name: Date of Service: [name], NO 04/04/2020 9:00 AM Medical Record Number:[id number] Patient Account Number: [id number] Date of Birth/Sex: Treating RN: 1951-07-02 (69 y.o. [name] [name] Primary Care Provider: Tedra [name] Other Clinician: Referring Provider: Treating Provider/Extender:[name] [name], [name] [name], [name] Weeks in Treatment: [phone] Cellular or Tissue Based Wound #5 Right,Medial Lower Leg Product Type Applied to: Performed By: Physician [name] [name]., MD Cellular or Tissue Based Apligraf Product Type: Level of Consciousness (Pre- Awake and Alert procedure): Pre-procedure Yes - 09:40 Verification/Time Out Taken: Location: trunk / arms / legs Wound Size (sq cm): 4 Product Size (sq cm): 44 Waste Size (sq cm): 22 Waste Reason: wound size Amount of Product Applied (sq cm): 22 Instrument Used: Forceps Lot #: [id number] Order #: [id number] Expiration Date: 04/12/2020 Fenestrated: Yes Instrument: Blade Reconstituted: Yes Solution Type: normal saline Solution Amount: 51m Lot #: [id number] Expiration Date: 08/30/2021 Secured: Yes Secured With: Steri-Strips Dressing Applied: Yes Primary Dressing: adaptic, gauze Procedural Pain: 0 Post Procedural Pain: 0 Response to Treatment: Procedure was tolerated well Level of Consciousness Awake and Alert (Post-procedure): Post Procedure Diagnosis Same as Pre-procedure Electronic Signature(s) Signed: 04/04/2020 5:24:04 PM By: [name] HamMD Entered By: [name] Hamon 04/04/2020 09:51:13 -------------------------------------------------------------------------------- Debridement Details Patient Name: Date of Service: [name]4/04/2020 9:00 AM Medical Record Number:[id number] Patient Account Number: [id number] of Birth/Sex: Treating RN: 912/15/1952(69y.o. FNancy FetterPrimary Care Provider:  BTedra SenegalOther Clinician: Referring Provider: Treating Provider/Extender:[name] [name], [name] [name] [name] Weeks in Treatment: 419 Debridement Performed for Wound #5 Right,Medial Lower Leg Assessment: Performed By: Physician RRicard Dillon, MD Debridement Type: Debridement Severity of Tissue Pre Fat layer exposed Debridement: Level of Consciousness (Pre- Awake and Alert procedure): Pre-procedure Verification/Time Out Taken: Yes - 09:35 Start Time: 09:35 Pain Control: Other : Benzocaine 20% Total Area Debrided (L x W): 2 (cm) x 2 (cm) = 4 (cm) Tissue and other material Viable, Non-Viable, Slough, Subcutaneous, Slough debrided: Level: Skin/Subcutaneous Tissue Debridement Description: Excisional Instrument: Curette Bleeding: Minimum Hemostasis Achieved: Pressure End Time: 09:37 Procedural Pain: 0 Post Procedural Pain: 0 Response to Treatment: Procedure was tolerated well Level of Consciousness Awake and Alert (Post-procedure): Post Debridement Measurements of Total Wound Length: (cm) 2 Width: (cm) 2 Depth: (cm) 0.2 Volume: (cm) 0.628 Character of Wound/Ulcer Post Improved Debridement: Severity of Tissue Post Debridement: Fat layer exposed Post Procedure Diagnosis Same as Pre-procedure Electronic Signature(s) Signed: 04/04/2020 5:09:30 PM By: [name] HurstRN, BSN Signed: 04/04/2020 5:24:04 PM By: [name] HamMD Entered By: [name] Hamon 04/04/2020 09:51:02 -------------------------------------------------------------------------------- HPI Details Patient Name: Date of Service: [name]4/04/2020 9:00 AM Medical Record Number:[id number] Patient Account Number: [id number] of Birth/Sex: Treating RN: 9May 22, 1952(69y.o. FNancy FetterPrimary Care Provider: BTedra SenegalOther Clinician: Referring Provider: Treating Provider/Extender:[name] [name], [name] [name] [name] Weeks in Treatment: 419 History of Present Illness HPI Description: this is  a [place] mal[place] bilaterally as well as her upper medial lower extremities bilaterally. These wounds eventually healed with assistance of Apligraf's bilaterally. While this was occurring she developed the current wound which opened into a fairly substantial wound on the right lower extremity. These are mostly secondary to venous stasis physiology however the patient also has underlying scleroderma, pulmonary hypertension. The wound has been making good progress lately with the Hydrofera Blue-based dressing. 03/17/2015; patient had a arterial evaluation a year  ago. Her right ABI was 0.86 left was 1.0. Toe brachial index was 0.41 on the right 0.45 on the left. Her bilateral common femoral artery waveforms were triphasic. Her white popliteal posterior tibial artery and anterior tibial artery waveforms were monophasic with good amplitude. Luteal artery waveforms were biphasic it was felt that her bilateral great toe pressures are of normal although adequate for tissue healing. 03/24/2015. The condition of this wound is not really improved that. He was covered with as fibrinous surface slough and eschar. This underwent an aggressive debridement with both a curette and scalpel. I still cannot really get down to what I can would consider to be a viable surface. There is no evidence of infection. Previous workup for ischemia roughly a year ago was negative nevertheless I think that continues to be a concern 04/07/15. The patient arrived for application of second Apligraf. Once again the surface of this wound is certainly less viable than I would like for an advanced treatment option. An aggressive debridement was done. She developed some arteriolar bleeding which required that along pressure and silver alginate. 04/21/15: change in the condition of this wound. Once again it is covered in a gelatinous surface slough. After debridement today surface of the  wound looks somewhat better but now a heeling surface 05/05/15 Apligraf #4 applied.Still a lot of slough on this wound. 05/19/15 Apligraf #5 applied. Still a lot of slough on this wound I did not aggressively remove this 06/02/15; continued copious amounts of surface slough. This debridement fairly easily. 06/08/2015 -- the last time she had a venous duplex study done was over 3 years ago and the surgery was prior to that. I have recommended that she sees Dr. early for a another opinion regarding a repeat venous duplex and possibly more endovenous ablation of vein stripping of micro-phlebotomies. 06/16/15; wound has a gelatinous surface eschar that the debridement fairly easily to a point. I don't disagree with the venous workup and perhaps even arterial re-evaluation. She is on prednisone 5 mg and continue his medications for her pulmonary artery hypertension I am not sure if the latter have any wound care healing issues I would need to investigate this. 06/23/15 continues with a gelatinous surface eschar with of fibrinous underlying. What I can see of this wound does not look unhealthy however I just can't get this material which I think is 2 different layers off. Empiric culture done 06/30/15; unfortunately not a lot of change in this wound. A gelatinous surface eschar is easily removed however it has a tight fibrinous surface underneath the. 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 The wound comes back and basically and unchanged state the. She has a gelatinous surface that is more easily removed however there is a tightly fibrinous surface underneath the. There is no evidence of infection. She has a vascular follow-up next month. I would have to inject her in order to do a more aggressive debridement of this area 07/21/15 the wound is roughly in the same state albeit the debridement is done with greater ease. There is less of the fibrinous eschar underneath the. There is no evidence  of infection. She has follow-up with vascular surgery next week. No evidence of surrounding infection. Her original distal wounds healed while this one formed. 07/28/15; wound is easier to debride. No wound erythema. She is seeing Dr. [name] next week. 08/18/15 Has been to [place] for repeat ablation. Have been using medihoney pad with some improvement  08/25/15; absolutely no change in the condition of this wound in either its overall size or surface condition in many months now. At one point I had this down to [place] healthier surface I think with [facility] however this did not actually progress towards closure. Do not believe that the wound has actually deteriorated in terms of volume at all. We have been using a medihoney pad which allows easier debridement of the gelatinous eschar but again no overall actual improvement. the patient is going towards an ablation with pain and vascular which I think is scheduled for next week. The only other investigation that I could first see would be a biopsy. She does have underlying scleroderma 09/02/15 eschar is much easier to debridement however the base of this does not look particularly vibrant. We changed to Iodoflex. The patient had her ablation earlier this week 09/09/15 again the debridement over the base of this wound is easier and the base of the wound looks considerably better. We will continue the Iodoflex. Dr. Tawni Millers has expressed his satisfaction with the result of her ablation 09/15/15 once again the wound is relatively free of surface eschar. No debridement was done today. It has a pale- looking base to it. although this is not as deep as it once was it seems to be expanding especially inferiorly. She has had recent venous ablation but this is no closer to healing.I've gone ahead and done a punch biopsy this from the inferior part of the wound close to normal skin 09/22/15: the wound is relatively free of surface eschar. There is some  surrounding eschar. I'm not exactly sure at what level the surface is that I am seeing. 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 No evidence of atypical infection, malignancy. 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 No debridement was done 09/29/15; the wound surface is relatively free of surface eschar. There is eschar surrounding the walls of the wound. Aside from the improvement in the amount of surface slough. This wound has not progressed any towards closure. There is not even a surface that looks like there at this is ready. There is no evidence of any infection nor maligancy based on biopsy I did on 9/15. I continued with the Iodoflex however I am looking towards some alternative to try to promote some closure or filling in of this surface. Consider triple layer Oasis. Collagen did not result in adequate control of the surface slough 10/13/15; the patient was in hospital last week with severe anemia. The wound looks somewhat better after debridement although there is widening medially. There is no evidence of infection. 10/20/15; patient's wound on the right lateral lower leg is essentially unchanged. This underwent a light surface debridement and in general the debridement is easier and the surface looks improved. I noted in doing this on the side of the wound what appears to be a piece of calcium deposition. The patient noted that she had previous things on the tips of her fingers. 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 She follows with rheumatology and I have asked them to talk to her about this. In view of that S the nonhealing ulcer may have something to do with calcinosis and also unrelieved Raynaud's disease in this area. I should note that her original wounds on the right and left medial malleolus and the inner aspects of both legs just below the  knees did however heal  over 10/30/15; the patient's wound on the right lateral lower leg is essentially unchanged. I was able to remove some calcified material from the medial wound edge. 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 Otherwise the wound appears essentially unchanged there is less adherent surface eschar. Some of the calcified material was sent to pathology for analysis 11/17/15. The patient's wound on the right lateral leg is essentially unchanged. Wider Medially. 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 There is some surface slough but I did not debridement this. No evidence of surrounding infection. 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 This will make healing difficult 12/15/15; the wound has less depth surface has a fibrinous slough and calcifications in the wound edge no evidence of infection 12/22/15; the wound definitely has less Fibrinous slough on the base. Calcifications around the wound edges are still evident. Although the wound bed looks healthier it is still pale in appearance. Previous biopsy did not show malignancy 01/04/15; surgical debridement of nonviable slough and subcutaneous tissue the wound cleans up quite nicely but appears to be expanding outward calcifications around the wound edges are still there. 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 She is to see her rheumatologist I'll see if he has any opinions on this. My punch biopsy done in September did not show calcifications although these are clearly evident. 01/19/16 light selective debridement of nonviable surface slough. There  is epithelialization medially. This gives me reason for cautious optimism. She has been to see her rheumatologist, there is nothing that can be done for this type of soft tissue calcification associated with scleroderma 02/02/16 no debridement although there is a light surface slough. She has 2 peninsulas of skin 1 inferiorly and one medially. We continue to make a slight and slow but definite progressive here 02/16/16; light surface debridement with more attention to the circumference of the wound bed where the fibrinous eschar is more prevalent. No calcifications detected. She seems to have done nicely with the [facility] with some epithelialization and some improvement in the overall wound volume. 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 Continued attention to the circumference of the wound where the fibrinous eschar in calcinosis or prevalent. No calcifications were detected. She has continued improvement with Hydrofera Blue. The wound is no longer as deep 03/15/16 surgical debridement done to remove surface escha Especially around the circumference of the wound where there is nonviable subcutaneous tissue. In spite of this there is considerable improvement in the overall dimensions and depth of the wound. Islands of epithelialization are seen especially medially inferiorly and superiorly to a lesser extent. She is using Hydrofera Blue at home 03/29/16; surgical debridement done to remove surface eschar and nonviable subcutaneous tissue. This cleans up quite nicely mention slightly larger in terms of length and width however depth is less 04/12/16; continued gradual improvement in terms of depth and the condition of the wound base. Debridement is done. Continuing long standing Hydrofera Blue at home with Kerlix Coban wraps 04/26/16; continued gradual improvement in  terms of depth and management as well as condition  of the wound base. Surgical debridement done she continues with Hydrofera Blue. 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 She initially came to this clinic venous insufficiency ulcers which have long since healed 05/17/16 continued improvement in terms of the depth and measurements of this wound although she has a tightly adherent fibrinous slough each time. We've been continued with long standing Hydrofera Blue which seems to done as well for this wound is many advanced treatment options. 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 She also has chronic venous insufficiency. She has an irritation on her lateral right ankle secondary to our wraps 05/10/16; wound appears to be smaller especially on the medial aspect and especially in the width. Wound was debridement surface looks better. She is also been in the hospital apparently with anemia again she tells me she had an endoscopy. Since she got home after 3 days which I believe was sometime last week she has had an irritated painful area on the right lateral ankle surrounding the lateral malleolus 05/31/16; much more adherent surface slough today then recently although I don't think the dimensions of changed that much. A more aggressive debridement is required. The irritated area over her medial malleolus is more pruritic and painful and I don't think represents cellulitis 06/14/16; no major change in her wound dimensions however there is more tightly adherent surface slough which is disappointing. As well as she appears to have a new small area medially. Furthermore an irritated uncomfortable area on the lateral aspect of her right foot just below the lateral malleolus. 06/21/16; I'm seeing the patient back in follow-up for the new areas under her major wound on the right anterior leg. She has been using Hydrofera Blue to this  area probably for several months now and although the dressings seem to be helping for quite a period of time I think things have stagnated lately. She comes in today with a relatively tight adherent surface slough and really no changes in the wound shape or dimension. The 2 small areas she had inferiorly are tiny but still open they seem improved this well. There is no uncontrolled edema and I don't think there is any evidence of cellulitis. 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 Patient has had vascular evaluations both venous and arterial. I have biopsied this area. There is no obvious infection. The worrisome thing today is that she seems to be developing areas of erythema and epithelial damage on the medial aspect of the right foot. Also to a lesser degree inferior to the actual wound itself. Again I see no obvious changes to suggest cellulitis however as this is the only treatable option I will probably give her antibiotics. 07/13/16 no major change in the lady's large anterior right leg wound. Still covered with a very tightly adherent surface slough which is difficult to debridement. There is less erythema around this, culture last week grew pseudomonas I gave her ciprofloxacin. The area on her lateral right malleolus looks better- 08/02/16 the patient's wounds continued to decline. Her original large anterior right leg wound looks deeper. Still adherent surface slough that is difficult to debridement. She has a small area just below this and a punched-out wound just below her lateral malleolus. In the meantime she is been in hospital with apparently an upper GI bleed on Plavix and aspirin.  She is now just on Plavix she received 3 units of packed cells 08/23/16; since I last saw this 3 weeks ago, the open large area on her right leg looks about the same syrup. She has a small  satellite lesion just underneath this. The area on her medial right ankle is now a deep necrotic wound. I attempted to debridement this however there is just too much pain. 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 She follows with vascular surgery and is scheduled for an angiogram in early September 09/06/16; the patient is going for an arteriogram tomorrow. Her original large wound on the right calf is about the same the satellite lesion underneath it is about the same however the area on her medial ankle is now deeper with exposed tendon. I am no longer attempting to debride these wounds 09/20/16; the patient has undergone a right femoral endarterectomy and Dacron patch angioplasty. This seems to have helped the flow in her right leg. 10/04/16; Arrived today for aggressive debridement of the wounds on her right calf the original wound the one beneath it and a difficult area over her right lateral leg just above the lateral malleolus. 10/25/16; her 3 open wounds are about the same in terms of dimensions however the surface appears a lot healthier post debridement. Using Iodoflex 10/18/16 we have been using Iodoflex to her wounds which she tolerated with some difficulty. 10/11/16; has been using Santyl for a period of time with some improvement although again very adherent surface slough would prevent any attempted healing this. She has a original wound on the left calf, the satellite underneath that and the most recent wound on the right medial ankle. She has completed revascularization by Dr. early and has had venous ablation earlier. Want to go back to Iodoflex to see if week and get a healthier surface to this wound bed failing this I think she'll need to be taken to the OR and I am prepared to call Dr. Marla Roe to discuss this. She is obviously not a good candidate for general anesthesia however.; 11/08/16; I put her on  Iodoflex last time to see if I can get the wound bed any healthier and unfortunately today still had tremendous surface slough. 11/15/16; 4 weeks' worth of Iodoflex with not much improvement. Debridement on the major wounds on her left anterior leg is easier however this does not maintain from week to week. The punched-out area on her medial right ankle 11/29/16; I attempted to change the patient last visit to [facility] however she states this burned and was very uncomfortable therefore we gave her permission to go back to the eye out of complex which she already had at home. Also she noted a lot of pain and swelling on the lateral aspect of her leg before she traveled to [facility] for the holidays, I called her in doxycycline over the phone. This seems to have helped 12/06/16; Wounds unchanged by in large. Using Iodoflex 12/13/16; her wounds today actually looks somewhat better. The area on the right lateral lower leg has reasonably healthy-looking granulation and perhaps as actually filled and a bit. Debridement of the 2 wounds on the medial calf is easier and post debridement appears to have a healthier base. We have referred her to Dr. Migdalia Dk for consideration of operative debridement 12/20/16; we have a quick note from Dr. Merri Ray who feels that the patient needs to be referred  to an Diplomatic Services operational officer. This is due to the complexity of the patient's medical issues as it applies to anything in the OR. We have been using Iodoflex 12/27/16; in general the wounds on the right leg are better in terms of the difficult to remove surface slough. She has been using Iodoflex. She is approved for Apligraf which I anticipated ordering in the next week or 2 when we get a better-looking surface 01/04/16 the deep wounds on the right leg generally look better. Both of them are debrided further surface slough. The area on the lateral right leg was not debrided. She  is approved for Apligraf I think I'll probably order this either next week or the week after depending on the surface of the wounds superiorly. We have been using Iodoflex which will continue until then 01/11/16; the deep wounds on the right leg again have a surface slough that requires debridement. I've not been able to get the wound bed on either one of these wounds down to what would be acceptable for an advanced skin stab- like Apligraf. The area on the medial leg has been improving. We have been using hot Iodoflex to all wounds which seems to do the best at at least limiting the nonviable surface 01/24/17; we have continued Iodoflex and all her wound areas. Her debridement Gen. he is easier and the surface underneath this looks viable. Nevertheless these are large area wounds with exposed muscle at least on the anterior parts. We have ordered Apligraf's for 2 weeks from now. The patient will be away next week 02/07/17; the patient was close to have first Apligraf today however we did not order one. I therefore replaced her Iodoflex. She essentially has 3 large punched-out areas on her right anterior leg and right medial ankle. 02/11/17; Apligraf #1 02/25/17; Apligraf #2. In general some improvement in the right medial ankle and right anterior leg wounds. The larger wound medially perhaps some better 03/11/17; Apligraf #3. In general continued improvement in the right medial ankle and the right anterior leg wounds. 03/25/17 Apligraf #4. In general continued improvement especially on the right medial ankle and the lower 04/08/17; Apligraf #5 in general continued improvement in all wound sites. 04/22/17; post Apligraf #5 her wound beds continued to look a lot better all of them up to the surface of the surrounding skin. Had a caramel-colored slough that I did not debrided in case there is residual Apligraf effect. The wounds are as good as I have seen these looking quite some time. 04/29/17; we applied  Sterling and last week after completing her Apligraf. Wounds look as though they've contracted somewhat although they have a nonviable surface which was problematic in the past. Apparently she has been approved for further Apligraf's. We applied Iodosorb today after debridement. 05/06/17; we're fortunate enough today to be able to apply additional Apligraf approved by her insurance. In general all of her wounds look better Apligraf #6 05/24/17; Apligraf #7 continued improvement in all wounds 3 06/10/17 Apligraf #8. Continued improvement in the surface of all wounds. Not much of an improvement in dimensions as I might a follow 06/24/17 Apligraf #9 continued general improvement although not as much change in the wound areas I might of like. She has a new open area on the right anterior lateral ankle very small and superficial. She also has a necrotic wound on the tip of her right index finger probably secondary to severe uncontrolled Raynaud's phenomenon. She is already on sildenafil and already  seen her rheumatologist who gave her Keflex. 07/08/17; Apligraf #10. Generalized improvement although she has a small additional wound just medial to the major wound area. 07/22/17; after discussion we decided not to reorder any further Apligraf's although there is been considerable improvement with these it hasn't been recently. The major wound anteriorly looks better. Smaller wounds beneath this and the more recent one and laterally look about the same. The area on the right lateral lower leg looks about the same 07/29/17 this is a patient who is exceedingly complex. She has advanced scleroderma, crest syndrome including calcinosis, PAD status post revascularization, chronic venous insufficiency status post ablations. She initially presented to this clinic with wounds on her bilateral lower legs however these closed. More recently we have been dealing with a large open area superiorly on the right anterior  leg, a smaller wound underneath this and then another one on the right medial lower extremity. These improve significantly with 10 Apligraf applications. Over the last 2-3 weeks we are making good progress with Hydrofera Blue and these seem to be making progress towards closure 08/19/17; wounds continued to make good improvement with Hydrofera Blue and episodic aggressive debridements 08/26/17; still using Hydrofera Blue. Good improvements 09/09/17; using Hydrofera Blue continued improvement. Area on the lateral part of her right leg has only a small remaining open area. The small inferior satellite region is for all intents and purposes closed. Her major wound also is come in in terms of depth and has advancing epithelialization. 09/16/17; using Hydrofera Blue with continued improvement. The smaller satellite wound. We've closed out today along with a new were satellite wound medially. The area on her medial ankle is still open and her major wound is still open but making improvement. All using Hydrofera Blue. Currently 09/30/17; using Hydrofera Blue. Still a small open area on the lateral right ankle area and her original major wound seems to be making gradual and steady improvement. 10/14/17; still using Hydrofera Blue. Still too small open areas on the right lateral ankle. Her original major wound is horizontal and linear. The most problematic area paradoxically seems to be the area to the medial area wears I thought it would be the lateral. 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 10/28/17; still using Hydrofera Blue. Right lateral ankle has a very small open area superiorly on the most lateral part of the wound. Her original open wound has 2 open areas now separated by normal skin and we've redefined this. 11/11/17; still using Hydrofera Blue area and right lateral ankle continues to have a small open area on mostly the lateral part of the  wound. Her original wound has 2 small open areas now separated by a considerable amount of normal skin 11/28/17; the patient called in slightly before Thanksgiving to report pain and erythema above the wound on the right leg. In the past this is responded well to treatment for cellulitis and I gave her over the phone doxycycline. She stated this resulted in fairly abrupt improvement. We have been using Hydrofera Blue for a prolonged period of time to the larger wound anteriorly into the remaining wound on her right right lateral ankle. The latter is just about closed with only a small linear area and the bottom of the [place]. 12/02/17; use endoform and left the dressing on since last visit. There is no tenderness and no evidence of infection. 12/16/17; patient has been using Endoform but not making much progress. The 2  punched out open areas anteriorly which were the reminiscence of her major wound appear deeper. The area on the lateral aspect of her right calf also appears deeper. Also she has a puzzling tender swelling above her wound on the right leg. This seems larger than last time. Just above her wounds there appears to be some fluctuance in this area it is not erythematous and there is no crepitus 12/30/17; patient has been using Endoform up until last week we used Hydrofera Blue. Ultrasound of the swelling above her 2 major wounds last time was negative for a fluid collection. I gave her cefaclor for the erythema and tenderness in this area which seems better. Unfortunately both punched out areas anteriorly and the area on her right medial lower leg appear deeper. In fact the lateral of the wounds anteriorly actually looks as though it has exposed tendon and/or muscle sheath. She is not systemically unwell. She is complaining of vaginitis type symptoms presumably Candida from her antibiotics. 01/06/18; we're using santyl. she has 2 punched out areas anteriorly which were initially part of a  large wound. Unfortunately medially this is now open to tendon/muscle. All the wounds have the same adherent very difficult to debride surface. 01/20/18; 2 week follow-up using Santyl. She has the 2 punched out areas anteriorly which were initially part of her large surface wound there. Medially this still has exposed muscle. All of these have the same tightly adherent necrotic surface which requires debridement. PuraPly was not accepted by the patient's insurance however her insurance I think it changed therefore we are going to run Apligraf to gain 02/03/18; the patient has been using Santyl. The wound on the right lateral ankle looks improved and the 2 areas anteriorly on the right leg looks about the same. The medial one has exposed muscle. The lateral 1 requiresdebridement. We use PuraPly today for the first week 02/10/18; PuraPly #2. The patient has 3 wounds. The area on the right lateral ankle, 2 areas anteriorly that were part of her original large wound in this area the medial one has exposed muscle. All of the wounds were lightly debrided with a number 3 curet. PuraPly #2 applied the lateral wound on the calf and the right lateral ankle look better. 02/17/18; PuraPly #3. Patient has 3 wounds. The area on the right lateral ankle in 2 areas internally that were part of her original large wound. The lateral area has exposed muscle. She arrived with some complaints of pain around the right ankle. 02/24/18; PuraPly #4; not much change in any of the 3 wound areas. Right lateral ankle, right lateral calf. Both of these required debridement with a #3 curet. She tolerates this marginally. The area on the medial leg still has exposed muscle. Not much change in dimensions 03/03/18 PuraPly #5. The area on the medial ankle actually looks better however the 2 separate areas that were original parts of the larger right anterior leg wound look as though they're attempting to coalesce. 03/10/18; PuraPly #6. The  area on the medial ankle actually continues to look and measures smaller however the 2 separate areas that were part of the original large wound on the right anterior leg have now coalesced. There hasn't been much improvement here. The lateral area actually has underlying exposed muscle 03/17/18-she is here in follow-up evaluation for ulcerations to the right lower extremity. She is voicing no complaints or concerns. She tolerated debridement. Puraply#7 placed 03/24/18; difficult right lower extremity ulcerations. PuraPly #8 place. She is been  approved for TheraSkins. She did very well with Apligraf today however she is apparently reached her "lifetime max" 03/31/18; marginal improvement with PuraPly although her wounds looked as good as they have in several weeks today. Used TheraSkins #1 04/14/18 TheraSkin #2 today 04/28/18 TheraSkins #3. Wound slightly improved 05/12/18; TheraSkin skin #4. Wound response has been variable. 05/27/18 TheraSkin #5. Generally improvement in all wound areas. I've also put her in 3 layer compression to help with the severe venous hypertension 06/09/18; patient is done quite well with the TheraSkins unfortunately we have no further applications. I also put her in 3 layer compression last week and that really seems to of helped. 06/16/18; we have been using silver collagen. Wounds are smaller. Still be open area to the muscle layer of her calf however even that is contracted somewhat. She tells me that at night sometimes she has pain on the right lateral calf at the site of her lower wound. Notable that I put her into 3 layer compression about 3 weeks ago. She states that she dangles her leg over the bed that makes it feel better but she does not describe claudication during the day She is going to call her secondary insurance to see if they will continue to cover advanced treatment products I have reviewed her arterial studies from 01/22/17; this showed an ABI in the right of  1 and on the left noncompressible. TBI on the right at 0.30 on the left at 0.34. It is therefore possible she has significant PAD with medial calcification falsely elevating her ABI into the normal range. I'll need to be careful about asking her about this next week it's possible the 3 layer compression is too much 06/23/18; was able to reapply TheraSkin 1 today. Edema control is good and she is not complaining of pain no claudication 07/07/18;no major change. New wound which was apparently a taper removal injury today in our clinic between her 2 wounds on the right calf TheraSkins #2 07/14/18; I think there is some improvement in the right lateral ankle and the medial part of her wound. There is still exposed muscle medially. 07/28/18; two-week follow-up. TheraSkins#3. Unfortunately no major change. 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 She seems to be doing well and in the base of the wound appears to show some progress at this point. This is the last approved Theraskin of the second set. 08/25/18; she has completed TheraSkin. There has been some improvement on the right lateral calf wound as well as the anterior leg wounds. The open area to muscle medially on the anterior leg wound is smaller. I'm going to transition her back to [facility] under Kerlix Coban change every second day. She reports that she had some calcification removed from her right upper arm. We have had previous problems with calcifications in her wounds on her legs but that has not happened recently 09/08/18;using Hydrofera Blue on both her wound areas. Wounds seem to of contracted nicely. She uses Kerlix Coban wrap and changes at home herself 09/22/2018; using Hydrofera Blue on both her wound areas. Dimensions seem to have come  down somewhat. There is certainly less depth in the medial part of the mid tibia wound and I do not think there is any exposed muscle at this point. 10/06/2018; 2-week follow-up. Using [facility]  on her wound areas. Dimensions have come down nicely both on the right lateral ankle area in the right mid tibial area. She has no new complaints 10/20/2018; 2-week follow-up. She is using Hydrofera Blue. Not much change from the last time she was here. The area on the lateral ankle has less depth although it has raised edges on one side. I attempted to remove as much of the raised edge as I could without creating more additional wounds. The area on the right anterior mid tibia area looks the same. 11/03/2018; 2-week follow-up she is using Hydrofera Blue. On the right anterior leg she now has 2 wounds separated by a large area of normal skin. The area on the medial part still has I think exposed muscle although this area itself is a lot smaller. The area laterally has some depth. Both areas with necrotic debris. The area on her right lateral ankle has come in nicely 11/17/2018; patient continues to use Hydrofera Blue. We have been increasing separation of the 2 wounds anteriorly which were at one point joint the area on the right lateral calf continues to have I think some improvement in depth. 12/08/2018; patient continues to use Hydrofera Blue. There is some improvement in the area on the right lateral calf. The 2 areas that were initially part of the original large wound in the mid right tibia are probably about the same. In fact the medial area is probably somewhat larger. We will run puraply through the patient's insurance 12/22/2018; she has been using Hydrofera Blue. We have small wounds on the right lateral calf and 2 small areas that were initially part of a large wound in the right mid tibia. We applied pure apply #1 today. 12/29/2018; we applied puraply #2. Her wounds look somewhat better  especially on the right lateral calf and the lateral part of her original wound in the mid tibial area. 01/05/2019; perhaps slightly improved in terms of wound bed condition but certainly not as much improvement as I might of liked. Puraply #3 1/13: we did not have a correct sized puraply to apply. wounds more pinched out looking, I increased her compression to 3 layer last week to help with significant multilevel venous hypertension. Since then I've reviewed her arterial status. She has a right femoral endarterectomy and a distal left SFA stent. She was being followed by Dr. [name] for a period however she does not appear to have seen him in 3 years. I will set up an appointment. 1/20. The patient has an additional wound on the right lateral calf between the distal wound and proximal wounds. We did not have Puraply last week. Still does not have a follow-up with Dr. early 1/27: Follow-up with Dr. early has been arranged apparently with follow-up noninvasive studies. Wounds are measuring roughly the same although they certainly look smaller 2/3; the patient had non invasive studies. Her ABIs on the right were 0.83 and on the left 1.02 however there was no great toe pressure bilaterally. Also worrisome monophasic waveforms at the PTA and dorsalis pedis. We are still using Puraply. We have had some improvement in all of the wounds especially the lateral part of the mid tibial area. 2/10; sees Dr. early of vein and vascular re-arterial studies next week. Puraply reapplied today. 2/17; Dr. early of vein and vascular his appointment is tomorrow. Puraply reapplied after debridement of all wounds 2/24; the patient saw Dr. early I reviewed his note. sHe noted the previous right femoral endarterectomy with a  Dacron patch. He also noted the normal ABI and the monophasic waveforms suggesting tibial disease. Overall he did not feel that she had any evidence of arterial insufficiency that would impair her wound  healing. He did note her venous disease as well. He suggested PRN follow-up. 3/2; I had the last puraply applied today. The original wounds over the mid tibia area are improved where is the area on the right lower leg is not 3/9; wounds are smaller especially in the right mid tibia perhaps slightly in the right lateral calf. We finished with puraply and went to endoform today 3/23; the patient arrives after 2 weeks. She has been using endoform. I think all of her wounds look slightly better which includes the area on the right lateral calf just above the right lateral malleolus and the 2 in the right mid tibia which were initially part of the same wound. 4/27; TELEHEALTH visit; the patient was seen for telehealth visit today with her consent in the middle of the worldwide epidemic. Since she was last here she called in for antibiotics with pain and tenderness around the area on the right medial ankle. I gave her empiric doxycycline. She states this feels better. She is using endoform on both of these areas 5/11 TELEHEALTH; the patient was seen for telehealth visit today. She was accompanied at home by her husband. She has severe pulmonary hypertension accompanied scleroderma and in the face of the Covid epidemic cannot be safely brought into our clinic. We have been using endoform on her wound areas. There are essentially 3 wound areas now in the left mid tibia now 2 open areas that it one point were connected and one on the right lateral ankle just above the malleolus. 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 She cannot come to clinic. The wounds were reviewed today via telehealth. She has severe chronic venous hypertension which I think is centrally mediated. She has wounds on her right anterior tibia and right  lateral ankle area. These are chronic. She has been using endoform. 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 We have been following her from telehealth. She has severe chronic venous hypertension which may be mostly centrally mediated secondary to right heart heart failure. She has wounds on her right anterior tibia and right lateral ankle these are chronic we have been using endoform 6/22; TELEHEALTH; this patient was seen today via telehealth. 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 Since the last time we had contact with this patient she developed some pain and erythema around the wound on her right lateral malleolus/ankle and we put in antibiotics for her. This is resulted in good improvement with resolution of the erythema and tenderness. I changed her to silver alginate last time. We had been using endoform for an extended period of time 7/13; TELEHEALTH; this patient was seen today via telehealth. 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 She would continue to be at a prohibitive risk to be brought into our clinic unless this was absolutely necessary. These visits have been done with her approval as well as her husband.  We have been using silver alginate to the areas on the right mid tibia and right lateral lower leg. 7/27 TELEHEALTH; patient was seen along with her husband today via telehealth. 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 We changed her to sample last visit. She has 2 areas a chronic wound on her right mid tibia and one just above her ankle. These were not the original wounds when she came into this clinic but she developed  them during treatment 8/17; she comes in for her first face-to-face visit in a long period. She has a remaining area just medial to the right tibia which is the last open part of her large wound across this area. She also has an area on the right lateral lower leg. We prescribed Santyl last telehealth visit but they were concerned that this was making a deeper so they put silver alginate on it last week. Her husband changes the dressings. 8/31; using Santyl to the 2 wound areas some improvement in wound surfaces. Husband has surgery in 2 weeks we will put her out 3 weeks. Any of the advanced treatment options that I can think of that would be eligible for this wound would also cause her to have to come in weekly. The risk that the patient is just too high 9/21. Using Santyl to the 2 wound areas. Both of these are somewhat better although the medial mid tibia area still has exposed muscle. Lateral ankle requiring debridement. Using Santyl 10/12; using Santyl to the 2 wound areas. One on the right lateral ankle and the other in the medial calf which still has exposed muscle. Both areas have come down in size and have better looking surfaces. She has made nice progress with santyl 11/2; we have been using Santyl to the 2 wound areas. Right lateral ankle and the other in the mid tibia area the medial part of this still has exposed muscle. 11/23/2019 on evaluation today patient appears to be doing about the same really with regard to her wounds. She is actually not very pleased with how things seem to be progressing at this point she tells me that she really has not noted much improvement unfortunately. With that being said there is no signs of active infection at this time. There is some slough buildup noted at this time which again along with some dry skin around the edges of the wound I think would benefit her to try to debride some of this away. Fortunately her pain is doing fairly well. She still  has exposed muscle in the right medial/tibial area. 12/14; TELEHEALTH; she was changed to [facility] to the right calf wound and right lateral ankle wound when she was here last time. Unfortunately since then she had a fall with a pelvic fracture and a fracture of her wrist. She was apparently hospitalized for 5 days. I have not looked at her discharge summary. She apparently came out of the hospital with a blister on her right heel. She was seen today via telehealth by myself and our case manager. The patient and her husband were present. She has been using Hydrofera Blue at our direction from the last time she was in the clinic. There is been no major improvement in fact the areas appear deeper and with a less viable surface 01/04/2020; TELEHEALTH; the patient was seen today in accompaniment of her husband and our nurse. She has 3  open areas 1 on the right medial mid tibia, one on the right lateral ankle and a large eschared pressure area on the right heel. We have been using Iodoflex to the 2 original wounds. 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 It is simply too perilous for her to be seen in any other way 1/26; TELEHEALTH; the patient was seen today in accompaniment of her husband and our RN one of our nurses. She still has the 3 open areas 1 on the right medial mid tibia which is the remanent of a more extensive wound in this area, one on the right lateral ankle and a large eschared pressure area on the right heel. We have been using Iodoflex to the 2 original wounds and a bed at 9 application to the eschared area on the heel 2/15; the first time we have seen this patient and then several months out of concern for the pandemic. She had a large horizontal wound in the mid tibia. Only the medial aspect of this is still open. Area on the lateral ankle is just about closed. She had a new pressure ulcer on the right heel which I have removed some of the  eschar. We have been using Iodoflex which I will continue. The area in the mid tibia has a round circle in the middle of exposed muscle. I think we would have to use an advanced treatment product to stimulate granulation over this area. We will run this through her insurance. She is not eligible for plastic surgery for many reasons 3/1; TELEHEALTH. The patient was seen today by telehealth. 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 We have been using Iodoflex to the wound areas which include the right anterior mid tibia, right lateral ankle and the right heel. All of these were reviewed 3/8; the patient's wound just above the right ankle is closed. She still has a contracting black eschar on the heel where she had a pressure ulcer. The medial part of her original wound on the mid tibia has exposed muscle. We have made really made no progress in this area although we have managed to get a lot of the original wound in this area to close. We used Apligraf #1 today 3/22; the patient's ankle wound remains closed. She still has a contracting black eschar on the heel although it seems to have less surface area. It still not clear whether there is any depth here. We used Apligraf #2 today in an attempt to get granulation over the exposed muscle and what is left of her mid tibia area. 4/5; everything is closed except the medial aspect of the mid tibia wound, as well as the pressure injury on the right heel. She has been using Betadine to the right heel which has been gradually contracting. We used Apligraf #3 today. Electronic Signature(s) Signed: 04/04/2020 5:24:04 PM By: Linton Ham MD Entered By: Linton [name] on 04/04/2020 09:52:09 -------------------------------------------------------------------------------- Physical Exam Details Patient Name: Date of Service: [name] [name] 04/04/2020 9:00  AM Medical Record Number:[id number] Patient Account Number: [id number] Date of Birth/Sex: Treating RN: [birth year] (69 y.o. [name] [name] Primary Care Provider: Tedra [name] Other Clinician: Referring Provider: Treating Provider/Extender:Xenia Nile, [name] [name], [name] Weeks in Treatment: 11 Constitutional Patient is hypertensive.. Pulse regular and within target range for patient.Marland Kitchen Respirations regular, non-labored and within target range.. Temperature is normal and within the target range for the patient.Marland Kitchen Appears  in no distress. Notes Wound exam Midportion of the tibia medially. Last remanent of a fairly substantial wound. She still has exposed muscle with some undermining superiorly. You have to get her to dorsi flex her foot in order to see the undermining area. The area was debrided with a #3 curette. Removing necrotic debris from the wound surface and surrounding circumference. There is no evidence of surrounding infection Electronic Signature(s) Signed: 04/04/2020 5:24:04 PM By: Linton Ham MD Entered By: Linton [name] on 04/04/2020 09:53:52 -------------------------------------------------------------------------------- Physician Orders Details Patient Name: Date of Service: [name] [name]. 04/04/2020 9:00 AM Medical Record Number:[id number] Patient Account Number: [id number] Date of Birth/Sex: Treating RN: [birth year] (69 y.o. 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 Dressing Change Frequency Wound #16 Right Calcaneus Change Dressing every other day. Wound #5 Right,Medial Lower Leg Change Dressing every other day. - ***DO NOT REMOVE STERI STRIPS - CHANGE OUTSIDE BANDAGE ONLY*** Skin Barriers/Peri-Wound Care Moisturizing lotion - both legs Wound Cleansing May shower and wash wound with soap and water. - on days that dressing is changed Primary Wound Dressing Wound #16 Right Calcaneus Other: - paint with betadine Wound #5 Right,Medial Lower Leg Skin Substitute Application - Apligraf #3 Secondary Dressing Wound #16 Right Calcaneus Dry Gauze Heel Cup Wound #5 Right,Medial Lower Leg Adaptic Dressing - secure with steri strips Dry Gauze Edema Control Kerlix and Coban - Right Lower Extremity Avoid standing for long periods of time Elevate legs to the level of the heart or above for 30 minutes daily and/or when sitting, a frequency of: - throughout the day Off-Loading Turn and reposition every 2 hours Other: - float heels off of bed/chair with pillow under calves Electronic Signature(s) Signed: 04/04/2020 5:09:30 PM By: Levan Hurst RN, BSN Signed: 04/04/2020 5:24:04 PM By: Linton Ham MD Entered By: Levan [name] on 04/04/2020 09:41:21 -------------------------------------------------------------------------------- Problem List Details Patient Name: Date of Service: [name] [name]. 04/04/2020 9:00 AM Medical Record Number:[id number] Patient Account Number: [id number] Date of Birth/Sex: Treating RN: May 11, 1951 (69 y.o. 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 02/24/2015 No Yes of calf and inflammation I87.331 Chronic venous hypertension (idiopathic) with ulcer 10/04/2016 No Yes and inflammation of right lower extremity L89.616 Pressure-induced deep tissue damage of right heel 12/14/2019 No Yes Inactive Problems ICD-10 Code Description Active Date Inactive Date 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/19/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 [name] [name] Primary Care Provider: Tedra [name] Other Clinician: Referring Provider: Treating Provider/Extender:Lorely Bubb, [name] [name], [name] Weeks in Treatment: 419 Subjective History of Present Illness (HPI) this is a patient who initially came to [place] for wounds on the medial malleoli bilaterally as well as her upper medial lower extremities bilaterally. These wounds eventually healed with assistance of Apligraf's bilaterally. While this was occurring she developed the current wound which opened into a fairly substantial wound on the right lower extremity. These are mostly secondary to venous stasis physiology however the patient also has underlying  scleroderma, pulmonary hypertension. The wound has been making good progress lately with the Hydrofera Blue-based dressing. 03/17/2015; patient had a arterial evaluation a year ago. Her right ABI was 0.86 left was 1.0. Toe brachial index was 0.41 on the right 0.45 on the left. Her bilateral common femoral artery waveforms were triphasic. Her white popliteal posterior tibial artery and anterior tibial artery waveforms were monophasic with good amplitude. Luteal artery waveforms were biphasic it was felt that her bilateral great toe pressures are of normal although adequate for tissue healing. 03/24/2015. The condition of this wound is not really improved that. He was covered with as fibrinous surface slough and eschar. This underwent an aggressive debridement with both a curette and scalpel. I still cannot really get down to what I can would consider to be a viable surface. There is no evidence of infection. Previous workup for ischemia roughly a year ago was negative nevertheless I think that continues to be a concern 04/07/15. The patient arrived for application of second Apligraf. Once again the surface of this wound is certainly less viable than I would like for an advanced treatment option. An aggressive debridement was done. She developed some arteriolar bleeding which required that along pressure and silver alginate. 04/21/15: change in the condition of this wound. Once again it is covered in a gelatinous surface slough. After debridement today surface of the wound looks somewhat better but now a heeling surface 05/05/15 Apligraf #4 applied.Still a lot of slough on this wound. 05/19/15 Apligraf #5 applied. Still a lot of slough on this wound I did not aggressively remove this 06/02/15; continued copious amounts of surface slough. This debridement fairly easily. 06/08/2015 -- the last time she had a venous duplex study done was over 3 years ago and the surgery was prior to that. I have recommended  that she sees Dr. early for a another opinion regarding a repeat venous duplex and possibly more endovenous ablation of vein stripping of micro-phlebotomies. 06/16/15; wound has a gelatinous surface eschar that the debridement fairly easily to a point. I don't disagree with the venous workup and perhaps even arterial re-evaluation. She is on prednisone 5 mg and continue his medications for her pulmonary artery hypertension I am not sure if the latter have any wound care healing issues I would need to investigate this. 06/23/15 continues with a gelatinous surface eschar with of fibrinous underlying. What I can see of this wound does not look unhealthy however I just can't get this material which I think is 2 different layers off. Empiric culture done 06/30/15; unfortunately not a lot of change in this wound. A gelatinous surface eschar is easily removed however it has a tight fibrinous surface underneath the. 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 The wound comes back and basically and unchanged state the. She has a gelatinous surface that is more easily removed however there is a tightly fibrinous surface underneath the. There is no evidence of infection. She has a vascular follow-up next month. I would have to inject her in order to do a more aggressive debridement of this area 07/21/15 the wound is roughly in the same state albeit the debridement is done with greater ease. There is less of the fibrinous eschar underneath the. There is no evidence of infection. She has follow-up with vascular surgery next week. No evidence of surrounding infection. Her original distal wounds healed while this one formed. 07/28/15; wound is easier to debride. No wound erythema. She is seeing Dr. [name] next week. 08/18/15 Has been to [facility] for repeat ablation. Have been using medihoney pad with some improvement 08/25/15; absolutely no change in the condition of this wound in either its  overall size or surface condition in many months now. At one point I had this down to [place] healthier surface I think with [facility] however this did not actually progress towards closure. Do not believe that the wound has actually deteriorated in terms of volume at all. We have been using a medihoney pad which allows easier debridement of the gelatinous eschar but again no overall actual improvement. the patient is going towards an ablation with pain and vascular which I think is scheduled for next week. The only other investigation that I could first see would be a biopsy. She does have underlying scleroderma 09/02/15 eschar is much easier to debridement however the base of this does not look particularly vibrant. We changed to Iodoflex. The patient had her ablation earlier this week 09/09/15 again the debridement over the base of this wound is easier and the base of the wound looks considerably better. We will continue the Iodoflex. Dr. Tawni Millers has expressed his satisfaction with the result of her ablation 09/15/15 once again the wound is relatively free of surface eschar. No debridement was done today. It has a pale- looking base to it. although this is not as deep as it once was it seems to be expanding especially inferiorly. She has had recent venous ablation but this is no closer to healing.I've gone ahead and done a punch biopsy this from the inferior part of the wound close to normal skin 09/22/15: the wound is relatively free of surface eschar. There is some surrounding eschar. I'm not exactly sure at what level the surface is that I am seeing. 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 No evidence of atypical infection, malignancy. 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 No debridement was done 09/29/15; the wound surface is relatively free of surface eschar. There is eschar surrounding the walls of the wound. Aside from the  improvement in the amount of surface slough. This wound has not progressed any towards closure. There is not even a surface that looks like there at this is ready. There is no evidence of any infection nor maligancy based on biopsy I did on 9/15. I continued with the Iodoflex however I am looking towards some alternative to try to promote some closure or filling in of this surface. Consider triple layer Oasis. Collagen did not result in adequate control of the surface slough 10/13/15; the patient was in hospital last week with severe anemia. The wound looks somewhat better after debridement although there is widening medially. There  is no evidence of infection. 10/20/15; patient's wound on the right lateral lower leg is essentially unchanged. This underwent a light surface debridement and in general the debridement is easier and the surface looks improved. I noted in doing this on the side of the wound what appears to be a piece of calcium deposition. The patient noted that she had previous things on the tips of her fingers. 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 She follows with rheumatology and I have asked them to talk to her about this. In view of that S the nonhealing ulcer may have something to do with calcinosis and also unrelieved Raynaud's disease in this area. I should note that her original wounds on the right and left medial malleolus and the inner aspects of both legs just below the knees did however heal over 10/30/15; the patient's wound on the right lateral lower leg is essentially unchanged. I was able to remove some calcified material from the medial wound edge. 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 Otherwise the wound appears essentially unchanged there is less adherent surface eschar. Some of the calcified material was sent to  pathology for analysis 11/17/15. The patient's wound on the right lateral leg is essentially unchanged. Wider Medially. 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 There is some surface slough but I did not debridement this. No evidence of surrounding infection. 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 This will make healing difficult 12/15/15; the wound has less depth surface has a fibrinous slough and calcifications in the wound edge no evidence of infection 12/22/15; the wound definitely has less Fibrinous slough on the base. Calcifications around the wound edges are still evident. Although the wound bed looks healthier it is still pale in appearance. Previous biopsy did not show malignancy 01/04/15; surgical debridement of nonviable slough and subcutaneous tissue the wound cleans up quite nicely but appears to be expanding outward calcifications around the wound edges are still there. 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 She is to see her rheumatologist I'll see if he has any opinions on this. My punch biopsy done in September did not show calcifications although these are clearly evident. 01/19/16 light selective debridement of nonviable surface slough. There is epithelialization medially. This gives me reason for cautious optimism. She has been to see her rheumatologist, there is nothing that can be done for this type of soft tissue calcification associated with scleroderma 02/02/16 no debridement although there is a light surface slough. She has 2 peninsulas of skin 1 inferiorly and one medially. We continue to make a slight and slow but definite progressive here 02/16/16; light surface  debridement with more attention to the circumference of the wound bed where the fibrinous eschar is more prevalent. No calcifications detected. She seems to have done nicely with the [facility] with some epithelialization and some improvement in the overall wound volume. 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 Continued attention to the circumference of the wound where the fibrinous eschar in  calcinosis or prevalent. No calcifications were detected. She has continued improvement with Hydrofera Blue. The wound is no longer as deep 03/15/16 surgical debridement done to remove surface escha Especially around the circumference of the wound where there is nonviable subcutaneous tissue. In spite of this there is considerable improvement in the overall dimensions and depth of the wound. Islands of epithelialization are seen especially medially inferiorly and superiorly to a lesser extent. She is using Hydrofera Blue at home 03/29/16; surgical debridement done to remove surface eschar and nonviable subcutaneous tissue. This cleans up quite nicely mention slightly larger in terms of length and width however depth is less 04/12/16; continued gradual improvement in terms of depth and the condition of the wound base. Debridement is done. Continuing long standing Hydrofera Blue at home with Kerlix Coban wraps 04/26/16; continued gradual improvement in terms of depth and management as well as condition of the wound base. Surgical debridement done she continues with Hydrofera Blue. 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 She initially came to this clinic venous insufficiency ulcers which have long since healed 05/17/16 continued improvement in terms of the depth and measurements of this wound although she has a tightly adherent  fibrinous slough each time. We've been continued with long standing Hydrofera Blue which seems to done as well for this wound is many advanced treatment options. 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 She also has chronic venous insufficiency. She has an irritation on her lateral right ankle secondary to our wraps 05/10/16; wound appears to be smaller especially on the medial aspect and especially in the width. Wound was debridement surface looks better. She is also been in the hospital apparently with anemia again she tells me she had an endoscopy. Since she got home after 3 days which I believe was sometime last week she has had an irritated painful area on the right lateral ankle surrounding the lateral malleolus 05/31/16; much more adherent surface slough today then recently although I don't think the dimensions of changed that much. A more aggressive debridement is required. The irritated area over her medial malleolus is more pruritic and painful and I don't think represents cellulitis 06/14/16; no major change in her wound dimensions however there is more tightly adherent surface slough which is disappointing. As well as she appears to have a new small area medially. Furthermore an irritated uncomfortable area on the lateral aspect of her right foot just below the lateral malleolus. 06/21/16; I'm seeing the patient back in follow-up for the new areas under her major wound on the right anterior leg. She has been using Hydrofera Blue to this area probably for several months now and although the dressings seem to be helping for quite a period of time I think things have stagnated lately. She comes in today with a relatively tight adherent surface slough and really no changes in the wound shape or dimension. The 2 small areas she had inferiorly are tiny but still open they seem improved this well. There is no uncontrolled edema and I don't think there is any  evidence of cellulitis. 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 Patient has had vascular evaluations both venous and arterial. I have biopsied this area. There is no obvious infection. The worrisome thing today is that she seems to be developing areas of erythema and  epithelial damage on the medial aspect of the right foot. Also to a lesser degree inferior to the actual wound itself. Again I see no obvious changes to suggest cellulitis however as this is the only treatable option I will probably give her antibiotics. 07/13/16 no major change in the lady's large anterior right leg wound. Still covered with a very tightly adherent surface slough which is difficult to debridement. There is less erythema around this, culture last week grew pseudomonas I gave her ciprofloxacin. The area on her lateral right malleolus looks better- 08/02/16 the patient's wounds continued to decline. Her original large anterior right leg wound looks deeper. Still adherent surface slough that is difficult to debridement. She has a small area just below this and a punched-out wound just below her lateral malleolus. In the meantime she is been in hospital with apparently an upper GI bleed on Plavix and aspirin. She is now just on Plavix she received 3 units of packed cells 08/23/16; since I last saw this 3 weeks ago, the open large area on her right leg looks about the same syrup. She has a small satellite lesion just underneath this. The area on her medial right ankle is now a deep necrotic wound. I attempted to debridement this however there is just too much pain. 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 She follows with vascular surgery and is scheduled for an angiogram in early September 09/06/16; the patient is going for an arteriogram  tomorrow. Her original large wound on the right calf is about the same the satellite lesion underneath it is about the same however the area on her medial ankle is now deeper with exposed tendon. I am no longer attempting to debride these wounds 09/20/16; the patient has undergone a right femoral endarterectomy and Dacron patch angioplasty. This seems to have helped the flow in her right leg. 10/04/16; Arrived today for aggressive debridement of the wounds on her right calf the original wound the one beneath it and a difficult area over her right lateral leg just above the lateral malleolus. 10/25/16; her 3 open wounds are about the same in terms of dimensions however the surface appears a lot healthier post debridement. Using Iodoflex 10/18/16 we have been using Iodoflex to her wounds which she tolerated with some difficulty. 10/11/16; has been using Santyl for a period of time with some improvement although again very adherent surface slough would prevent any attempted healing this. She has a original wound on the left calf, the satellite underneath that and the most recent wound on the right medial ankle. She has completed revascularization by Dr. early and has had venous ablation earlier. Want to go back to Iodoflex to see if week and get a healthier surface to this wound bed failing this I think she'll need to be taken to the OR and I am prepared to call Dr. Marla Roe to discuss this. She is obviously not a good candidate for general anesthesia however.; 11/08/16; I put her on Iodoflex last time to see if I can get the wound bed any healthier and unfortunately today still had tremendous surface slough. 11/15/16; 4 weeks' worth of Iodoflex with not much improvement. Debridement on the major wounds on her left anterior leg is easier however this does not maintain from week to week. The punched-out area on her medial right ankle 11/29/16; I attempted to change the patient last visit to [facility]  however she states this burned and was very uncomfortable therefore we gave her permission to go back to the eye out of complex which she already had at home. Also she noted a lot of pain and swelling on the lateral aspect of her leg before she traveled to [facility] for the holidays, I called her in doxycycline over the phone. This seems to have helped 12/06/16; Wounds unchanged by in large. Using Iodoflex 12/13/16; her wounds today actually looks somewhat better. The area on the right lateral lower leg has reasonably healthy-looking granulation and perhaps as actually filled and a bit. Debridement of the 2 wounds on the medial calf is easier and post debridement appears to have a healthier base. We have referred her to Dr. Migdalia Dk for consideration of operative debridement 12/20/16; we have a quick note from Dr. Merri Ray who feels that the patient needs to be referred to an academic center/plastic surgeon. This is due to the complexity of the patient's medical issues as it applies to anything in the OR. We have been using Iodoflex 12/27/16; in general the wounds on the right leg are better in terms of the difficult to remove surface slough. She has been using Iodoflex. She is approved for Apligraf which I anticipated ordering in the next week or 2 when we get a better-looking surface 01/04/16 the deep wounds on the right leg generally look better. Both of them are debrided further surface slough. The area on the lateral right leg was not debrided. She is approved for Apligraf I think I'll probably order this either next week or the week after depending on the surface of the wounds superiorly. We have been using Iodoflex which will continue until then 01/11/16; the deep wounds on the right leg again have a surface slough that requires debridement. I've not been able to get the wound bed on either one of these wounds down to what would be acceptable for an advanced skin  stab- like Apligraf. The area on the medial leg has been improving. We have been using hot Iodoflex to all wounds which seems to do the best at at least limiting the nonviable surface 01/24/17; we have continued Iodoflex and all her wound areas. Her debridement Gen. he is easier and the surface underneath this looks viable. Nevertheless these are large area wounds with exposed muscle at least on the anterior parts. We have ordered Apligraf's for 2 weeks from now. The patient will be away next week 02/07/17; the patient was close to have first Apligraf today however we did not order one. I therefore replaced her Iodoflex. She essentially has 3 large punched-out areas on her right anterior leg and right medial ankle. 02/11/17; Apligraf #1 02/25/17; Apligraf #2. In general some improvement in the right medial ankle and right anterior leg wounds. The larger wound medially perhaps some better 03/11/17; Apligraf #3. In general continued improvement in the right medial ankle and the right anterior leg wounds. 03/25/17 Apligraf #4. In general continued improvement especially on the right medial ankle and the lower 04/08/17; Apligraf #5 in general continued improvement in all wound sites. 04/22/17; post Apligraf #5 her wound beds continued to look a lot better all of them up to the surface of the surrounding skin. Had a caramel-colored slough that I did not debrided in case there is residual Apligraf effect. The wounds are as good as I have seen these looking quite some time. 04/29/17; we applied Anderson and last week after completing her Apligraf. Wounds  look as though they've contracted somewhat although they have a nonviable surface which was problematic in the past. Apparently she has been approved for further Apligraf's. We applied Iodosorb today after debridement. 05/06/17; we're fortunate enough today to be able to apply additional Apligraf approved by her insurance. In general all of her wounds look  better Apligraf #6 05/24/17; Apligraf #7 continued improvement in all wounds o3 06/10/17 Apligraf #8. Continued improvement in the surface of all wounds. Not much of an improvement in dimensions as I might a follow 06/24/17 Apligraf #9 continued general improvement although not as much change in the wound areas I might of like. She has a new open area on the right anterior lateral ankle very small and superficial. She also has a necrotic wound on the tip of her right index finger probably secondary to severe uncontrolled Raynaud's phenomenon. She is already on sildenafil and already seen her rheumatologist who gave her Keflex. 07/08/17; Apligraf #10. Generalized improvement although she has a small additional wound just medial to the major wound area. 07/22/17; after discussion we decided not to reorder any further Apligraf's although there is been considerable improvement with these it hasn't been recently. The major wound anteriorly looks better. Smaller wounds beneath this and the more recent one and laterally look about the same. The area on the right lateral lower leg looks about the same 07/29/17 this is a patient who is exceedingly complex. She has advanced scleroderma, crest syndrome including calcinosis, PAD status post revascularization, chronic venous insufficiency status post ablations. She initially presented to this clinic with wounds on her bilateral lower legs however these closed. More recently we have been dealing with a large open area superiorly on the right anterior leg, a smaller wound underneath this and then another one on the right medial lower extremity. These improve significantly with 10 Apligraf applications. Over the last 2-3 weeks we are making good progress with Hydrofera Blue and these seem to be making progress towards closure 08/19/17; wounds continued to make good improvement with Hydrofera Blue and episodic aggressive debridements 08/26/17; still using Hydrofera  Blue. Good improvements 09/09/17; using Hydrofera Blue continued improvement. Area on the lateral part of her right leg has only a small remaining open area. The small inferior satellite region is for all intents and purposes closed. Her major wound also is come in in terms of depth and has advancing epithelialization. 09/16/17; using Hydrofera Blue with continued improvement. The smaller satellite wound. We've closed out today along with a new were satellite wound medially. The area on her medial ankle is still open and her major wound is still open but making improvement. All using Hydrofera Blue. Currently 09/30/17; using Hydrofera Blue. Still a small open area on the lateral right ankle area and her original major wound seems to be making gradual and steady improvement. 10/14/17; still using Hydrofera Blue. Still too small open areas on the right lateral ankle. Her original major wound is horizontal and linear. The most problematic area paradoxically seems to be the area to the medial area wears I thought it would be the lateral. 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 10/28/17; still using Hydrofera Blue. Right lateral ankle has a very small open area superiorly on the most lateral part of the wound. Her original open wound has 2 open areas now separated by normal skin and we've redefined this. 11/11/17; still using Hydrofera Blue area and right lateral ankle continues to have a  small open area on mostly the lateral part of the wound. Her original wound has 2 small open areas now separated by a considerable amount of normal skin 11/28/17; the patient called in slightly before Thanksgiving to report pain and erythema above the wound on the right leg. In the past this is responded well to treatment for cellulitis and I gave her over the phone doxycycline. She stated this resulted in fairly abrupt improvement. We have been using Hydrofera Blue  for a prolonged period of time to the larger wound anteriorly into the remaining wound on her right right lateral ankle. The latter is just about closed with only a small linear area and the bottom of the [place]. 12/02/17; use endoform and left the dressing on since last visit. There is no tenderness and no evidence of infection. 12/16/17; patient has been using Endoform but not making much progress. The 2 punched out open areas anteriorly which were the reminiscence of her major wound appear deeper. The area on the lateral aspect of her right calf also appears deeper. Also she has a puzzling tender swelling above her wound on the right leg. This seems larger than last time. Just above her wounds there appears to be some fluctuance in this area it is not erythematous and there is no crepitus 12/30/17; patient has been using Endoform up until last week we used Hydrofera Blue. Ultrasound of the swelling above her 2 major wounds last time was negative for a fluid collection. I gave her cefaclor for the erythema and tenderness in this area which seems better. Unfortunately both punched out areas anteriorly and the area on her right medial lower leg appear deeper. In fact the lateral of the wounds anteriorly actually looks as though it has exposed tendon and/or muscle sheath. She is not systemically unwell. She is complaining of vaginitis type symptoms presumably Candida from her antibiotics. 01/06/18; we're using santyl. she has 2 punched out areas anteriorly which were initially part of a large wound. Unfortunately medially this is now open to tendon/muscle. All the wounds have the same adherent very difficult to debride surface. 01/20/18; 2 week follow-up using Santyl. She has the 2 punched out areas anteriorly which were initially part of her large surface wound there. Medially this still has exposed muscle. All of these have the same tightly adherent necrotic surface which requires debridement.  PuraPly was not accepted by the patient's insurance however her insurance I think it changed therefore we are going to run Apligraf to gain 02/03/18; the patient has been using Santyl. The wound on the right lateral ankle looks improved and the 2 areas anteriorly on the right leg looks about the same. The medial one has exposed muscle. The lateral 1 requiresdebridement. We use PuraPly today for the first week 02/10/18; PuraPly #2. The patient has 3 wounds. The area on the right lateral ankle, 2 areas anteriorly that were part of her original large wound in this area the medial one has exposed muscle. All of the wounds were lightly debrided with a number 3 curet. PuraPly #2 applied the lateral wound on the calf and the right lateral ankle look better. 02/17/18; PuraPly #3. Patient has 3 wounds. The area on the right lateral ankle in 2 areas internally that were part of her original large wound. The lateral area has exposed muscle. She arrived with some complaints of pain around the right ankle. 02/24/18; PuraPly #4; not much change in any of the 3 wound areas. Right lateral ankle, right  lateral calf. Both of these required debridement with a #3 curet. She tolerates this marginally. The area on the medial leg still has exposed muscle. Not much change in dimensions 03/03/18 PuraPly #5. The area on the medial ankle actually looks better however the 2 separate areas that were original parts of the larger right anterior leg wound look as though they're attempting to coalesce. 03/10/18; PuraPly #6. The area on the medial ankle actually continues to look and measures smaller however the 2 separate areas that were part of the original large wound on the right anterior leg have now coalesced. There hasn't been much improvement here. The lateral area actually has underlying exposed muscle 03/17/18-she is here in follow-up evaluation for ulcerations to the right lower extremity. She is voicing no complaints or  concerns. She tolerated debridement. Puraply#7 placed 03/24/18; difficult right lower extremity ulcerations. PuraPly #8 place. She is been approved for [employer]. She did very well with Apligraf today however she is apparently reached her "lifetime max" 03/31/18; marginal improvement with PuraPly although her wounds looked as good as they have in several weeks today. Used TheraSkins #1 04/14/18 TheraSkin #2 today 04/28/18 TheraSkins #3. Wound slightly improved 05/12/18; TheraSkin skin #4. Wound response has been variable. 05/27/18 TheraSkin #5. Generally improvement in all wound areas. I've also put her in 3 layer compression to help with the severe venous hypertension 06/09/18; patient is done quite well with the TheraSkins unfortunately we have no further applications. I also put her in 3 layer compression last week and that really seems to of helped. 06/16/18; we have been using silver collagen. Wounds are smaller. Still be open area to the muscle layer of her calf however even that is contracted somewhat. She tells me that at night sometimes she has pain on the right lateral calf at the site of her lower wound. Notable that I put her into 3 layer compression about 3 weeks ago. She states that she dangles her leg over the bed that makes it feel better but she does not describe claudication during the day ooShe is going to call her secondary insurance to see if they will continue to cover advanced treatment products I have reviewed her arterial studies from 01/22/17; this showed an ABI in the right of 1 and on the left noncompressible. TBI on the right at 0.30 on the left at 0.34. It is therefore possible she has significant PAD with medial calcification falsely elevating her ABI into the normal range. I'll need to be careful about asking her about this next week it's possible the 3 layer compression is too much 06/23/18; was able to reapply TheraSkin 1 today. Edema control is good and she is not  complaining of pain no claudication 07/07/18;no major change. New wound which was apparently a taper removal injury today in our clinic between her 2 wounds on the right calf TheraSkins #2 07/14/18; I think there is some improvement in the right lateral ankle and the medial part of her wound. There is still exposed muscle medially. 07/28/18; two-week follow-up. TheraSkins#3. Unfortunately no major change. She is not a candidate I don't think for skin grafting due to severe venous hypertension associated with her scleroderma and pulmonary hypertension 08/11/18 Patient is here today for her [employer] application #40 (#5 of the second set). She seems to be doing well and in the base of the wound appears to show some progress at this point. This is the last approved [employer]askin of the second set. 08/25/18; she has completed  TheraSkin. There has been some improvement on the right lateral calf wound as well as the anterior leg wounds. The open area to muscle medially on the anterior leg wound is smaller. I'm going to transition her back to [facility] under Kerlix Coban change every second day. She reports that she had some calcification removed from her right upper arm. We have had previous problems with calcifications in her wounds on her legs but that has not happened recently 09/08/18;using Hydrofera Blue on both her wound areas. Wounds seem to of contracted nicely. She uses Kerlix Coban wrap and changes at home herself 09/22/2018; using Hydrofera Blue on both her wound areas. Dimensions seem to have come down somewhat. There is certainly less depth in the medial part of the mid tibia wound and I do not think there is any exposed muscle at this point. 10/06/2018; 2-week follow-up. Using [facility] on her wound areas. Dimensions have come down nicely both on the right lateral ankle area in the right mid tibial area. She has no new complaints 10/20/2018; 2-week follow-up. She is using Hydrofera Blue.  Not much change from the last time she was here. The area on the lateral ankle has less depth although it has raised edges on one side. I attempted to remove as much of the raised edge as I could without creating more additional wounds. The area on the right anterior mid tibia area looks the same. 11/03/2018; 2-week follow-up she is using Hydrofera Blue. On the right anterior leg she now has 2 wounds separated by a large area of normal skin. The area on the medial part still has I think exposed muscle although this area itself is a lot smaller. The area laterally has some depth. Both areas with necrotic debris. ooThe area on her right lateral ankle has come in nicely 11/17/2018; patient continues to use Hydrofera Blue. We have been increasing separation of the 2 wounds anteriorly which were at one point joint the area on the right lateral calf continues to have I think some improvement in depth. 12/08/2018; patient continues to use Hydrofera Blue. There is some improvement in the area on the right lateral calf. The 2 areas that were initially part of the original large wound in the mid right tibia are probably about the same. In fact the medial area is probably somewhat larger. We will run puraply through the patient's insurance 12/22/2018; she has been using Hydrofera Blue. We have small wounds on the right lateral calf and 2 small areas that were initially part of a large wound in the right mid tibia. We applied pure apply #1 today. 12/29/2018; we applied puraply #2. Her wounds look somewhat better especially on the right lateral calf and the lateral part of her original wound in the mid tibial area. 01/05/2019; perhaps slightly improved in terms of wound bed condition but certainly not as much improvement as I might of liked. Puraply #3 1/13: we did not have a correct sized puraply to apply. wounds more pinched out looking, I increased her compression to 3 layer last week to help with  significant multilevel venous hypertension. Since then I've reviewed her arterial status. She has a right femoral endarterectomy and a distal left SFA stent. She was being followed by Dr. [name] for a period however she does not appear to have seen him in 3 years. I will set up an appointment. 1/20. The patient has an additional wound on the right lateral calf between the distal wound and proximal  wounds. We did not have Puraply last week. Still does not have a follow-up with Dr. early 1/27: Follow-up with Dr. early has been arranged apparently with follow-up noninvasive studies. Wounds are measuring roughly the same although they certainly look smaller 2/3; the patient had non invasive studies. Her ABIs on the right were 0.83 and on the left 1.02 however there was no great toe pressure bilaterally. Also worrisome monophasic waveforms at the PTA and dorsalis pedis. We are still using Puraply. We have had some improvement in all of the wounds especially the lateral part of the mid tibial area. 2/10; sees Dr. early of vein and vascular re-arterial studies next week. Puraply reapplied today. 2/17; Dr. early of vein and vascular his appointment is tomorrow. Puraply reapplied after debridement of all wounds 2/24; the patient saw Dr. early I reviewed his note. sHe noted the previous right femoral endarterectomy with a Dacron patch. He also noted the normal ABI and the monophasic waveforms suggesting tibial disease. Overall he did not feel that she had any evidence of arterial insufficiency that would impair her wound healing. He did note her venous disease as well. He suggested PRN follow-up. 3/2; I had the last puraply applied today. The original wounds over the mid tibia area are improved where is the area on the right lower leg is not 3/9; wounds are smaller especially in the right mid tibia perhaps slightly in the right lateral calf. We finished with puraply and went to endoform today 3/23; the  patient arrives after 2 weeks. She has been using endoform. I think all of her wounds look slightly better which includes the area on the right lateral calf just above the right lateral malleolus and the 2 in the right mid tibia which were initially part of the same wound. 4/27; TELEHEALTH visit; the patient was seen for telehealth visit today with her consent in the middle of the worldwide epidemic. Since she was last here she called in for antibiotics with pain and tenderness around the area on the right medial ankle. I gave her empiric doxycycline. She states this feels better. She is using endoform on both of these areas 5/11 TELEHEALTH; the patient was seen for telehealth visit today. She was accompanied at home by her husband. She has severe pulmonary hypertension accompanied scleroderma and in the face of the Covid epidemic cannot be safely brought into our clinic. We have been using endoform on her wound areas. There are essentially 3 wound areas now in the left mid tibia now 2 open areas that it one point were connected and one on the right lateral ankle just above the malleolus. 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 She cannot come to clinic. The wounds were reviewed today via telehealth. She has severe chronic venous hypertension which I think is centrally mediated. She has wounds on her right anterior tibia and right lateral ankle area. These are chronic. She has been using endoform. 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 We have been following her from telehealth. She has severe chronic venous hypertension which may be mostly centrally mediated secondary to right heart  heart failure. She has wounds on her right anterior tibia and right lateral ankle these  are chronic we have been using endoform 6/22; TELEHEALTH; this patient was seen today via telehealth. 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 Since the last time we had contact with this patient she developed some pain and erythema around the wound on her right lateral malleolus/ankle and we put in antibiotics for her. This is resulted in good improvement with resolution of the erythema and tenderness. I changed her to silver alginate last time. We had been using endoform for an extended period of time 7/13; TELEHEALTH; this patient was seen today via telehealth. 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 She would continue to be at a prohibitive risk to be brought into our clinic unless this was absolutely necessary. These visits have been done with her approval as well as her husband. We have been using silver alginate to the areas on the right mid tibia and right lateral lower leg. 7/27 TELEHEALTH; patient was seen along with her husband today via telehealth. 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 We changed her to sample last visit. She has 2 areas a chronic wound on her right mid tibia and one just above her ankle. These were not the original wounds when she came into this clinic but she developed them during treatment 8/17; she comes in for her first face-to-face visit in a long period. She has a remaining area just medial to the right tibia which is the last open part of her large wound across this area. She also has an area on the right lateral lower leg. We prescribed Santyl last telehealth visit but they were concerned that this was making a deeper so they put silver alginate on it  last week. Her husband changes the dressings. 8/31; using Santyl to the 2 wound areas some improvement in wound surfaces. Husband has surgery in 2 weeks we will put her out 3 weeks. Any of the advanced treatment options that I can think of that would be eligible for this wound would also cause her to have to come in weekly. The risk that the patient is just too high 9/21. Using Santyl to the 2 wound areas. Both of these are somewhat better although the medial mid tibia area still has exposed muscle. Lateral ankle requiring debridement. Using Santyl 10/12; using Santyl to the 2 wound areas. One on the right lateral ankle and the other in the medial calf which still has exposed muscle. Both areas have come down in size and have better looking surfaces. She has made nice progress with santyl 11/2; we have been using Santyl to the 2 wound areas. Right lateral ankle and the other in the mid tibia area the medial part of this still has exposed muscle. 11/23/2019 on evaluation today patient appears to be doing about the same really with regard to her wounds. She is actually not very pleased with how things seem to be progressing at this point she tells me that she really has not noted much improvement unfortunately. With that being said there is no signs of active infection at this time. There is some slough buildup noted at this time which again along with some dry skin around the edges of the wound I think would benefit her to try to debride some of this away. Fortunately her pain is doing fairly well.  She still has exposed muscle in the right medial/tibial area. 12/14; TELEHEALTH; she was changed to [facility] to the right calf wound and right lateral ankle wound when she was here last time. Unfortunately since then she had a fall with a pelvic fracture and a fracture of her wrist. She was apparently hospitalized for 5 days. I have not looked at her discharge summary. She apparently came out of  the hospital with a blister on her right heel. She was seen today via telehealth by myself and our case manager. The patient and her husband were present. She has been using Hydrofera Blue at our direction from the last time she was in the clinic. There is been no major improvement in fact the areas appear deeper and with a less viable surface 01/04/2020; TELEHEALTH; the patient was seen today in accompaniment of her husband and our nurse. She has 3 open areas 1 on the right medial mid tibia, one on the right lateral ankle and a large eschared pressure area on the right heel. We have been using Iodoflex to the 2 original wounds. 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 It is simply too perilous for her to be seen in any other way 1/26; TELEHEALTH; the patient was seen today in accompaniment of her husband and our RN one of our nurses. She still has the 3 open areas 1 on the right medial mid tibia which is the remanent of a more extensive wound in this area, one on the right lateral ankle and a large eschared pressure area on the right heel. We have been using Iodoflex to the 2 original wounds and a bed at 9 application to the eschared area on the heel 2/15; the first time we have seen this patient and then several months out of concern for the pandemic. She had a large horizontal wound in the mid tibia. Only the medial aspect of this is still open. Area on the lateral ankle is just about closed. She had a new pressure ulcer on the right heel which I have removed some of the eschar. We have been using Iodoflex which I will continue. The area in the mid tibia has a round circle in the middle of exposed muscle. I think we would have to use an advanced treatment product to stimulate granulation over this area. We will run this through her insurance. She is not eligible for plastic surgery for many reasons 3/1; TELEHEALTH. The patient was seen today by telehealth. 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 We have been using Iodoflex to the wound areas which include the right anterior mid tibia, right lateral ankle and the right heel. All of these were reviewed 3/8; the patient's wound just above the right ankle is closed. She still has a contracting black eschar on the heel where she had a pressure ulcer. The medial part of her original wound on the mid tibia has exposed muscle. We have made really made no progress in this area although we have managed to get a lot of the original wound in this area to close. We used Apligraf #1 today 3/22; the patient's ankle wound remains closed. She still has a contracting black eschar on the heel although it seems to have less surface area. It still not clear whether there is any depth here. We used Apligraf #2 today in an attempt to get granulation over  the exposed muscle and what is left of her mid tibia area. 4/5; everything is closed except the medial aspect of the mid tibia wound, as well as the pressure injury on the right heel. She has been using Betadine to the right heel which has been gradually contracting. We used Apligraf #3 today. Objective Constitutional Patient is hypertensive.. Pulse regular and within target range for patient.Marland Kitchen Respirations regular, non-labored and within target range.. Temperature is normal and within the target range for the patient.Marland Kitchen Appears in no distress. Vitals Time Taken: 9:10 AM, Height: 68 in, Weight: 132 lbs, BMI: 20.1, Temperature: 98.3 F, Pulse: 76 bpm, Respiratory Rate: 18 breaths/min, Blood Pressure: 145/42 mmHg. General Notes: Wound exam ooMidportion of the tibia medially. Last remanent of a fairly substantial wound. She still has exposed muscle with some undermining superiorly. You have to get her to dorsi flex her foot in order to see the undermining area. The area was  debrided with a #3 curette. Removing necrotic debris from the wound surface and surrounding circumference. There is no evidence of surrounding infection Integumentary (Hair, Skin) Wound #16 status is Open. Original cause of wound was Pressure Injury. The wound is located on the Right Calcaneus. The wound measures 1cm length x 1.5cm width x 0.1cm depth; 1.178cm^2 area and 0.118cm^3 volume. Wound #5 status is Open. Original cause of wound was Gradually Appeared. The wound is located on the Right,Medial Lower Leg. The wound measures 2cm length x 2cm width x 0.2cm depth; 3.142cm^2 area and 0.628cm^3 volume. 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 There was a Excisional Skin/Subcutaneous Tissue Debridement with a total area of 4 sq cm performed by [name] [name]., MD. With the following instrument(s): Curette to remove Viable and Non-Viable tissue/material. Material removed includes Subcutaneous Tissue and Slough and after achieving pain control using Other (Benzocaine 20%). No specimens were taken. A time out was conducted at 09:35, prior to the start of the procedure. A Minimum amount of bleeding was controlled with Pressure. The procedure was tolerated well with a pain level of 0 throughout and a pain level of 0 following the procedure. Post Debridement Measurements: 2cm length x 2cm width x 0.2cm depth; 0.628cm^3 volume. Character of Wound/Ulcer Post  Debridement is improved. Severity of Tissue Post Debridement is: Fat layer exposed. Post procedure Diagnosis Wound #5: Same as Pre-Procedure Pre-procedure diagnosis of Wound #5 is a Venous Leg Ulcer located on the Right,Medial Lower Leg. A skin graft procedure using a bioengineered skin substitute/cellular or tissue based product was performed by [name] [name]., MD with the following instrument(s): Forceps. Apligraf was applied and secured with Steri-Strips. 22 sq cm of product was utilized and 22 sq cm was wasted due to wound size. Post Application, adaptic, gauze was applied. A Time Out was conducted at 09:40, prior to the start of the procedure. The procedure was tolerated well with a pain level of 0 throughout and a pain level of 0 following the procedure. Post procedure Diagnosis Wound #5: Same as Pre-Procedure . Plan Follow-up  Appointments: Return Appointment in 2 weeks. Dressing Change Frequency: Wound #16 Right Calcaneus: Change Dressing every other day. Wound #5 Right,Medial Lower Leg: Change Dressing every other day. - ***DO NOT REMOVE STERI STRIPS - CHANGE OUTSIDE BANDAGE ONLY*** Skin Barriers/Peri-Wound Care: Moisturizing lotion - both legs Wound Cleansing: May shower and wash wound with soap and water. - on days that dressing is changed Primary Wound Dressing: Wound #16 Right Calcaneus: Other: - paint with betadine Wound #5 Right,Medial Lower Leg: Skin Substitute Application - Apligraf #3 Secondary Dressing: Wound #16 Right Calcaneus: Dry Gauze Heel Cup Wound #5 Right,Medial Lower Leg: Adaptic Dressing - secure with steri strips Dry Gauze Edema Control: Kerlix and Coban - Right Lower Extremity Avoid standing for long periods of time Elevate legs to the level of the heart or above for 30 minutes daily and/or when sitting, a frequency of: - throughout the day Off-Loading: Turn and reposition every 2 hours Other: - float heels off of bed/chair with pillow  under calves 1. Apligraf #3 applied 2. So far not much improvement. I was hoping to see more tissue adhesion and more granulation but I am not ready to state that things have not been improved. 3. This is a very difficult situation where the base of this is mostly muscle Electronic Signature(s) Signed: 04/04/2020 5:24:04 PM By: Linton Ham MD Entered By: Linton [name] on 04/04/2020 09:54:59 -------------------------------------------------------------------------------- SuperBill Details Patient Name: Date of Service: [name] [name] 04/04/2020 Medical Record Number:[id number] Patient Account Number: [id number] Date of Birth/Sex: Treating RN: 1951/10/12 (69 y.o. 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 I87.331 Chronic venous hypertension (idiopathic) with ulcer and inflammation of right lower extremity L89.616 Pressure-induced deep tissue damage of right heel Facility Procedures CPT4: Description Modifier Quantity Code [id number] (Facility Use Only) Apligraf 1 SQ CM 44 CPT4: [id number] - SKIN SUB GRAFT TRNK/ARM/LEG 1 ICD-10 Diagnosis Description I87.331 Chronic venous hypertension (idiopathic) with ulcer and inflammation of right lower extremity L97.213 Non-pressure chronic ulcer of right calf with necrosis of  muscle Physician Procedures CPT4: Code [id number] Description: 54 - WC PHYS SKIN SUB GRAFT TRNK/ARM/LEG ICD-10 Diagnosis Description I87.331 Chronic venous hypertension (idiopathic) with ulcer and infla extremity L97.213 Non-pressure chronic  ulcer of right calf with necrosis of mus Modifier: mmation of rig cle Quantity: 1 ht lower Electronic Signature(s) Signed: 04/04/2020 5:09:30 PM By: Levan Hurst RN, BSN Signed: 04/04/2020 5:24:04 PM By: Linton Ham MD Entered By: Levan [name] on 04/04/2020 12:00:15

## 2020-04-05 ENCOUNTER — Telehealth: Payer: Self-pay | Admitting: Internal Medicine

## 2020-04-05 DIAGNOSIS — G629 Polyneuropathy, unspecified: Secondary | ICD-10-CM | POA: Diagnosis not present

## 2020-04-05 DIAGNOSIS — I739 Peripheral vascular disease, unspecified: Secondary | ICD-10-CM | POA: Diagnosis not present

## 2020-04-05 DIAGNOSIS — M349 Systemic sclerosis, unspecified: Secondary | ICD-10-CM | POA: Diagnosis not present

## 2020-04-05 DIAGNOSIS — S62102D Fracture of unspecified carpal bone, left wrist, subsequent encounter for fracture with routine healing: Secondary | ICD-10-CM

## 2020-04-05 DIAGNOSIS — S52592D Other fractures of lower end of left radius, subsequent encounter for closed fracture with routine healing: Secondary | ICD-10-CM | POA: Diagnosis not present

## 2020-04-05 DIAGNOSIS — S32592D Other specified fracture of left pubis, subsequent encounter for fracture with routine healing: Secondary | ICD-10-CM

## 2020-04-05 NOTE — Telephone Encounter (Signed)
Faxed signed re certification orders to [facility] [phone]  03-28-2020 to 05-26-2020

## 2020-04-06 NOTE — Progress Notes (Signed)
[name], CRESON ([id number]) Visit Report for 04/04/2020 Arrival Information Details Patient Name: Date of Service: [name] [name], [name] [name] 04/04/2020 9:00 AM Medical Record Number:[id number] Patient Account Number: [id number] Date of Birth/Sex: Treating RN: 20-Feb-1951 (69 y.o. Orvan Falconer Primary Care Elajah Kunsman: [name] Other Clinician: Referring Alandra Sando: Treating Darleen Moffitt/Extender:Robson, Leotis Shames, MARY Weeks in Treatment: 419 Visit Information History Since Last Visit All ordered tests and consults were completed: No Patient Arrived: Wheel Chair Added or deleted any medications: No Arrival Time: 09:10 Any new allergies or adverse reactions: No Accompanied By: husband Had a fall or experienced change in No activities of daily living that may affect Transfer Assistance: None risk of falls: Patient Identification Verified: Yes Signs or symptoms of abuse/neglect since last No Secondary Verification Process Completed: Yes visito Patient Requires Transmission-Based No Hospitalized since last visit: No Precautions: Implantable device outside of the clinic excluding No Patient Has Alerts: No cellular tissue based products placed in the center since last visit: Has Dressing in Place as Prescribed: Yes Has Compression in Place as Prescribed: Yes Pain Present Now: No Electronic Signature(s) Signed: 04/05/2020 6:43:44 PM By: Carlene Coria RN Entered By: Carlene Coria on 04/04/2020 09:10:51 -------------------------------------------------------------------------------- Encounter Discharge Information Details Patient Name: Date of Service: [name] [name]. 04/04/2020 9:00 AM Medical Record Number:[id number] Patient Account Number: [id number] Date of Birth/Sex: Treating RN: 1951/08/20 (69 y.o. [name] Primary Care Sione Baumgarten: [name] Other Clinician: Referring Leshon Armistead: Treating Jermond Burkemper/Extender:Robson, Leotis Shames, MARY Weeks in Treatment: [phone] Encounter  Discharge Information Items Post Procedure Vitals Discharge Condition: Stable Temperature (F): 98.3 Ambulatory Status: Wheelchair Pulse (bpm): 76 Discharge Destination: Home Respiratory Rate (breaths/min): 18 Transportation: Private Auto Blood Pressure (mmHg): 145/42 Accompanied By: husband Schedule Follow-up Appointment: Yes Clinical Summary of Care: Patient Declined Electronic Signature(s) Signed: 04/04/2020 4:55:48 PM By: Kela Millin Entered By: Kela Millin on 04/04/2020 09:56:46 -------------------------------------------------------------------------------- Lower Extremity Assessment Details Patient Name: Date of Service: [name], [name] 04/04/2020 9:00 AM Medical Record Number:[id number] Patient Account Number: [id number] Date of Birth/Sex: Treating RN: 30-Apr-1951 (69 y.o. Orvan Falconer Primary Care Tameisha Covell: [name] Other Clinician: Referring Daymien Goth: Treating Friedrich Harriott/Extender:Robson, Leotis Shames, MARY Weeks in Treatment: 419 Edema Assessment Assessed: [Left: No] [Right: No] Edema: [Left: Ye] [Right: s] Calf Left: Right: Point of Measurement: 36 cm From Medial Instep cm 31 cm Ankle Left: Right: Point of Measurement: 10 cm From Medial Instep cm 21 cm Electronic Signature(s) Signed: 04/05/2020 6:43:44 PM By: Carlene Coria RN Entered By: Carlene Coria on 04/04/2020 09:20:23 -------------------------------------------------------------------------------- Multi Wound Chart Details Patient Name: Date of Service: [name] [name] 04/04/2020 9:00 AM Medical Record Number:[id number] Patient Account Number: [id number] Date of Birth/Sex: Treating RN: [birth year] (69 y.o. [name] Primary Care Heloise Gordan: [name] Other Clinician: Referring Levetta Bognar: Treating Madalynn Pickelsimer/Extender:Robson, Leotis Shames, MARY Weeks in Treatment: 419 Vital Signs Height(in): 68 Pulse(bpm): 76 Weight(lbs): 132 Blood Pressure(mmHg): 145/42 Body Mass Index(BMI):  20 Temperature(F): 98.3 Respiratory 18 Rate(breaths/min): Photos: [16:No Photos] [5:No Photos] [N/A:N/A] Wound Location: [16:Right Calcaneus] [5:Right, Medial Lower Leg] [N/A:N/A] Wounding Event: [16:Pressure Injury] [5:Gradually Appeared] [N/A:N/A] Primary Etiology: [16:Pressure Ulcer] [5:Venous Leg Ulcer] [N/A:N/A] Date Acquired: [16:01/04/2020] [5:01/13/2013] [N/A:N/A] Weeks of Treatment: [16:13] [5:375] [N/A:N/A] Wound Status: [16:Open] [5:Open] [N/A:N/A] Clustered Wound: [16:No] [5:Yes] [N/A:N/A] Measurements L x W x D [16:1x1.5x0.1] [5:2x2x0.2] [N/A:N/A] (cm) Area (cm) : [16:1.178] [5:3.142] [N/A:N/A] Volume (cm) : [16:0.118] [5:0.628] [N/A:N/A] % Reduction in Area: [16:90.60%] [5:-122.20%] [N/A:N/A] % Reduction in Volume: [16:90.60%] [5:-345.40%] [N/A:N/A] Classification: [16:Unstageable/Unclassified] [5:Full Thickness With Exposed Support Structures] [N/A:N/A] Debridement: [16:N/A] [5:Debridement - Excisional  N/A] Pre-procedure [16:N/A] [5:09:35] [N/A:N/A] Verification/Time Out Taken: Pain Control: [16:N/A] [5:Other] [N/A:N/A] Tissue Debrided: [16:N/A] [5:Subcutaneous, Slough] [N/A:N/A] Level: [16:N/A] [5:Skin/Subcutaneous Tissue] [N/A:N/A] Debridement Area (sq cm):N/A [5:4] [N/A:N/A] Instrument: [16:N/A] [5:Curette] [N/A:N/A] Bleeding: [16:N/A] [5:Minimum] [N/A:N/A] Hemostasis Achieved: [16:N/A] [5:Pressure] [N/A:N/A] Procedural Pain: [16:N/A] [5:0] [N/A:N/A] Post Procedural Pain: [16:N/A] [5:0] [N/A:N/A] Debridement Treatment N/A [5:Procedure was tolerated] [N/A:N/A] Response: [5:well] Post Debridement [16:N/A] [5:2x2x0.2] [N/A:N/A] Measurements L x W x D (cm) Post Debridement [16:N/A] [5:0.628] [N/A:N/A] Volume: (cm) Procedures Performed: N/A [5:Cellular or Tissue Based Product Debridement] [N/A:N/A] Treatment Notes Electronic Signature(s) Signed: 04/04/2020 5:09:30 PM By: Levan Hurst RN, BSN Signed: 04/04/2020 5:24:04 PM By: Linton Ham MD Entered By:  Linton Ham on 04/04/2020 09:50:51 -------------------------------------------------------------------------------- Multi-Disciplinary Care Plan Details Patient Name: Date of Service: [name] [name]. 04/04/2020 9:00 AM Medical Record Number:[id number] Patient Account Number: [id number] Date of Birth/Sex: Treating RN: 03-10-1951 (69 y.o. [name] Primary Care Ceniyah Thorp: [name] Other Clinician: Referring Unity Luepke: Treating Deshanda Molitor/Extender:Robson, Leotis Shames, MARY Weeks in Treatment: 419 Active Inactive Wound/Skin Impairment Nursing Diagnoses: Impaired tissue integrity Knowledge deficit related to ulceration/compromised skin integrity Goals: Patient/caregiver will verbalize understanding of skin care regimen Date Initiated: 10/11/2016 Target Resolution Date: 05/06/2020 Goal Status: Active Interventions: Assess patient/caregiver ability to obtain necessary supplies Assess patient/caregiver ability to perform ulcer/skin care regimen upon admission and as needed Assess ulceration(s) every visit Provide education on ulcer and skin care Treatment Activities: Skin care regimen initiated : 10/11/2016 Topical wound management initiated : 10/11/2016 Notes: Electronic Signature(s) Signed: 04/04/2020 5:09:30 PM By: Levan Hurst RN, BSN Entered By: Levan Hurst on 04/04/2020 09:42:16 -------------------------------------------------------------------------------- Pain Assessment Details Patient Name: Date of Service: [name] [name] 04/04/2020 9:00 AM Medical Record Number:[id number] Patient Account Number: [id number] Date of Birth/Sex: Treating RN: 1951/05/01 (69 y.o. Orvan Falconer Primary Care Noelle Sease: [name] Other Clinician: Referring Annakate Soulier: Treating Krystan Northrop/Extender:Robson, Leotis Shames, MARY Weeks in Treatment: [phone] Active Problems Location of Pain Severity and Description of Pain Patient Has Paino No Site Locations Pain Management and  Medication Current Pain Management: Electronic Signature(s) Signed: 04/05/2020 6:43:44 PM By: Carlene Coria RN Entered By: Carlene Coria on 04/04/2020 09:11:42 -------------------------------------------------------------------------------- Patient/Caregiver Education Details Patient Name: Date of Service: Brix, Ayeisha F. 4/5/2021andnbsp9:00 AM Medical Record Number:[id number] Patient Account Number: [id number] Date of Birth/Gender: [birth year] (68 y.o. F) Treating RN: Levan Hurst Primary Care Physician: [name] Other Clinician: Referring Physician: Treating Physician/Extender:Robson, Leotis Shames, Timoteo Expose in Treatment: [phone] Education Assessment Education Provided To: Patient Education Topics Provided Wound/Skin Impairment: Methods: Explain/Verbal Responses: State content correctly Electronic Signature(s) Signed: 04/04/2020 5:09:30 PM By: Levan Hurst RN, BSN Entered By: Levan Hurst on 04/04/2020 09:42:34 -------------------------------------------------------------------------------- Wound Assessment Details Patient Name: Date of Service: [name] [name] 04/04/2020 9:00 AM Medical Record Number:[id number] Patient Account Number: [id number] Date of Birth/Sex: Treating RN: 01/11/1951 (69 y.o. Orvan Falconer Primary Care Tationna Fullard: [name] Other Clinician: Referring Shakima Nisley: Treating Draiden Mirsky/Extender:Robson, Leotis Shames, MARY Weeks in Treatment: 419 Wound Status Wound Number: 16 Primary Etiology: Pressure Ulcer Wound Location: Right Calcaneus Wound Status: Open Wounding Event: Pressure Injury Date Acquired: 01/04/2020 Weeks Of Treatment: 13 Clustered Wound: No Wound Measurements Length: (cm) 1 Width: (cm) 1.5 Depth: (cm) 0.1 Area: (cm) 1.178 Volume: (cm) 0.118 Wound Description Classification: Unstageable/Unclassified % Reduction in Area: 90.6% % Reduction in Volume: 90.6% Treatment Notes Wound #16 (Right Calcaneus) 1. Cleanse With Wound  Cleanser 3. Primary Dressing Applied Cellular Based Tissue Product Other primary dressing (specifiy in notes) 4. Secondary Dressing Dry Gauze 6. Support Layer Applied Kerlix/Coban Notes betadine  to heel. apligraft to medial lower leg with adaptic/steri-strips Electronic Signature(s) Signed: 04/05/2020 6:43:44 PM By: Carlene Coria RN Entered By: Carlene Coria on 04/04/2020 09:20:56 -------------------------------------------------------------------------------- Wound Assessment Details Patient Name: Date of Service: EMERALD, SHOR 04/04/2020 9:00 AM Medical Record Number:[id number] Patient Account Number: [id number] Date of Birth/Sex: Treating RN: 1951/11/09 (69 y.o. Orvan Falconer Primary Care Esbeydi Manago: [name] Other Clinician: Referring Lerone Onder: Treating Jerzie Bieri/Extender:Robson, Leotis Shames, MARY Weeks in Treatment: 419 Wound Status Wound Number: 5 Primary Etiology: Venous Leg Ulcer Wound Location: Right, Medial Lower Leg Wound Status: Open Wounding Event: Gradually Appeared Date Acquired: 01/13/2013 Weeks Of Treatment: 375 Clustered Wound: Yes Wound Measurements Length: (cm) 2 Width: (cm) 2 Depth: (cm) 0.2 Area: (cm) 3.142 Volume: (cm) 0.628 Wound Description Full Thickness With Exposed Support Classification: Structures % Reduction in Area: -122.2% % Reduction in Volume: -345.4% Treatment Notes Wound #5 (Right, Medial Lower Leg) 1. Cleanse With Wound Cleanser 3. Primary Dressing Applied Cellular Based Tissue Product Other primary dressing (specifiy in notes) 4. Secondary Dressing Dry Gauze 6. Support Layer Applied Kerlix/Coban Notes betadine to heel. apligraft to medial lower leg with adaptic/steri-strips Electronic Signature(s) Signed: 04/05/2020 6:43:44 PM By: Carlene Coria RN Entered By: Carlene Coria on 04/04/2020 09:20:56 -------------------------------------------------------------------------------- Vitals Details Patient Name: Date of  Service: [name] [name]. 04/04/2020 9:00 AM Medical Record Number:[id number] Patient Account Number: [id number] Date of Birth/Sex: Treating RN: 25-Jun-1951 (69 y.o. Orvan Falconer Primary Care Donis Pinder: [name] Other Clinician: Referring Clemons Salvucci: Treating Eissa Buchberger/Extender:Robson, Leotis Shames, MARY Weeks in Treatment: 419 Vital Signs Time Taken: 09:10 Temperature (F): 98.3 Height (in): 68 Pulse (bpm): 76 Weight (lbs): 132 Respiratory Rate (breaths/min): 18 Body Mass Index (BMI): 20.1 Blood Pressure (mmHg): 145/42 Reference Range: 80 - 120 mg / dl Electronic Signature(s) Signed: 04/05/2020 6:43:44 PM By: Carlene Coria RN Entered By: Carlene Coria on 04/04/2020 09:11:34

## 2020-04-07 DIAGNOSIS — S52592D Other fractures of lower end of left radius, subsequent encounter for closed fracture with routine healing: Secondary | ICD-10-CM | POA: Diagnosis not present

## 2020-04-07 DIAGNOSIS — M349 Systemic sclerosis, unspecified: Secondary | ICD-10-CM | POA: Diagnosis not present

## 2020-04-07 DIAGNOSIS — S32592D Other specified fracture of left pubis, subsequent encounter for fracture with routine healing: Secondary | ICD-10-CM | POA: Diagnosis not present

## 2020-04-07 DIAGNOSIS — I739 Peripheral vascular disease, unspecified: Secondary | ICD-10-CM | POA: Diagnosis not present

## 2020-04-07 DIAGNOSIS — S62102D Fracture of unspecified carpal bone, left wrist, subsequent encounter for fracture with routine healing: Secondary | ICD-10-CM | POA: Diagnosis not present

## 2020-04-07 DIAGNOSIS — G629 Polyneuropathy, unspecified: Secondary | ICD-10-CM | POA: Diagnosis not present

## 2020-04-11 DIAGNOSIS — L820 Inflamed seborrheic keratosis: Secondary | ICD-10-CM | POA: Diagnosis not present

## 2020-04-13 NOTE — Progress Notes (Signed)
[name], [name] ([id number]) Visit Report for 03/21/2020 Arrival Information Details Patient Name: Date of Service: [name] [name], [name] [name] 03/21/2020 9:00 AM Medical Record Number:[id number] Patient Account Number: [id number] Date of Birth/Sex: Treating RN: 1951-12-13 (69 y.o. [name] [name] Primary Care Rudie Sermons: [name] Other Clinician: Referring Jayli Fogleman: Treating Endy Easterly/Extender:Robson, Leotis Shames, MARY Weeks in Treatment: 417 Visit Information History Since Last Visit Added or deleted any medications: No Patient Arrived: Wheel Chair Any new allergies or adverse reactions: No Arrival Time: 09:30 Had a fall or experienced change in No activities of daily living that may affect Accompanied By: husband risk of falls: Transfer Assistance: None Signs or symptoms of abuse/neglect since last No Patient Identification Verified: Yes visito Secondary Verification Process Completed: Yes Hospitalized since last visit: No Patient Requires Transmission-Based No Implantable device outside of the clinic excluding No Precautions: cellular tissue based products placed in the center Patient Has Alerts: No since last visit: Has Dressing in Place as Prescribed: Yes Pain Present Now: No Electronic Signature(s) Signed: 04/13/2020 9:21:08 AM By: Sandre Kitty Entered By: Sandre Kitty on 03/21/2020 09:33:27 -------------------------------------------------------------------------------- Encounter Discharge Information Details Patient Name: Date of Service: [name] [name] 03/21/2020 9:00 AM Medical Record Number:[id number] Patient Account Number: [id number] Date of Birth/Sex: Treating RN: 1951-10-01 (69 y.o. [name] [name] Primary Care Alonnie [name]: [name] Other Clinician: Referring Camira Geidel: Treating Miyeko Mahlum/Extender:Robson, Leotis Shames, MARY Weeks in Treatment: [phone] Encounter Discharge Information Items Post Procedure Vitals Discharge Condition: Stable Temperature  (F): 98.3 Ambulatory Status: Wheelchair Pulse (bpm): 69 Discharge Destination: Home Respiratory Rate (breaths/min): 18 Transportation: Private Auto Blood Pressure (mmHg): 159/46 Accompanied By: husband Schedule Follow-up Appointment: Yes Clinical Summary of Care: Electronic Signature(s) Signed: 03/21/2020 5:27:21 PM By: Deon Pilling Entered By: Deon Pilling on 03/21/2020 10:27:28 -------------------------------------------------------------------------------- Lower Extremity Assessment Details Patient Name: Date of Service: [name] [name], [name] [name] 03/21/2020 9:00 AM Medical Record Number:[id number] Patient Account Number: [id number] Date of Birth/Sex: Treating RN: 20-Apr-1951 (69 y.o. [name] [name] Primary Care [place] Sarinana: [name] Other Clinician: Referring Gid Schoffstall: Treating Neveen Daponte/Extender:Robson, Leotis Shames, MARY Weeks in Treatment: 417 Edema Assessment Assessed: [Left: No] [Right: No] Edema: [Left: Ye] [Right: s] Calf Left: Right: Point of Measurement: 36 cm From Medial Instep cm 31 cm Ankle Left: Right: Point of Measurement: 10 cm From Medial Instep cm 21 cm Vascular Assessment Pulses: Dorsalis Pedis Palpable: [Right:Yes] Electronic Signature(s) Signed: 03/21/2020 5:18:21 PM By: Levan Hurst RN, BSN Entered By: Levan Hurst on 03/21/2020 09:47:58 -------------------------------------------------------------------------------- Multi Wound Chart Details Patient Name: Date of Service: [name] [name] 03/21/2020 9:00 AM Medical Record Number:[id number] Patient Account Number: [id number] Date of Birth/Sex: Treating RN: October 08, 1951 (69 y.o. 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] [N/A:N/A] Comorbid History: [16:Anemia, Hypertension, Peripheral Arterial Disease, Peripheral Arterial Disease, Peripheral Venous Disease, Peripheral Venous Disease, Raynauds, Scleroderma, Raynauds, Scleroderma, Rheumatoid Arthritis, Osteoarthritis] [5:Anemia,  Hypertension, Rheumatoid Arthritis, Osteoarthritis] [N/A:N/A] Date Acquired: [16:01/04/2020] [5:01/13/2013] [N/A:N/A] Weeks of Treatment: [16:11] [5:373] [N/A:N/A] Wound Status: [16:Open] [5:Open] [N/A:N/A] Clustered Wound: [16:No] [5:Yes] [N/A:N/A] Clustered Quantity: [16:N/A] [5:1] [N/A:N/A] Measurements L x W x D 1.8x1.5x0.1 [5:1.9x1.5x0.3] [N/A:N/A] (cm) Area (cm) : [16:2.121] [5:2.238] [N/A:N/A] Volume (cm) : [16:0.212] [5:0.672] [N/A:N/A] % Reduction  in Area: [16:83.10%] [5:-58.30%] [N/A:N/A] % Reduction in Volume: 83.10% [5:-376.60%] [N/A:N/A] Classification: [16:Unstageable/Unclassified Full Thickness With] [5:Exposed Support Structures] [N/A:N/A] Exudate Amount: [16:Medium] [5:Medium] [N/A:N/A] Exudate Type: [16:Serosanguineous] [5:Serosanguineous] [N/A:N/A] Exudate Color: [16:red, brown] [5:red, brown] [N/A:N/A] Wound Margin: [16:Flat and Intact] [5:Well defined, not attached N/A] Granulation Amount: [16:None Present (0%)] [5:Medium (34-66%)] [N/A:N/A] Granulation Quality: [16:N/A] [5:Pink] [N/A:N/A] Necrotic Amount: [16:Large (67-100%)] [5:Medium (34-66%)] [N/A:N/A] Necrotic Tissue: [16:Eschar] [5:Adherent Slough] [N/A:N/A] Exposed Structures: [16:Fascia: No Fat Layer (Subcutaneous Tissue) Exposed: Yes Tissue) Exposed: No Tendon: No Muscle: No Joint: No Bone: No] [5:Fat Layer (Subcutaneous N/A Muscle: Yes Fascia: No Tendon: No Joint: No  Bone: No] Epithelialization: [16:None] [5:Small (1-33%)] [N/A:N/A] Debridement: [16:N/A] [5:Debridement - Excisional N/A] Pre-procedure [16:N/A] [5:10:00] [N/A:N/A] Verification/Time Out Taken: Pain Control: [16:N/A] [5:Other] [N/A:N/A] Tissue Debrided: [16:N/A] [5:Subcutaneous, Slough] [N/A:N/A] Level: [16:N/A] [5:Skin/Subcutaneous Tissue N/A] Debridement Area (sq cm):N/A [5:2.85] Instrument: [16:N/A] [5:Curette] Bleeding: [16:N/A] [5:Minimum] Hemostasis Achieved: [16:N/A] [5:Pressure] Procedural Pain: [16:N/A] [5:0] Post Procedural Pain: [16:N/A] [5:0] Debridement Treatment N/A [5:Procedure was tolerated] Response: [5:well] Post Debridement [16:N/A] [5:1.9x1.5x0.3] Measurements L x W x D (cm) Post Debridement [16:N/A] [5:0.672] Volume: (cm) Procedures Performed: N/A [5:Cellular or Tissue Based Product Debridement] Treatment Notes Wound #16 (Right Calcaneus) 1. Cleanse With Wound Cleanser Soap and water 2. Periwound Care Moisturizing lotion 3. Primary Dressing Applied Other primary dressing (specifiy in notes) 4. Secondary Dressing Dry Gauze 6. Support Layer Applied Kerlix/Coban Notes betadine to heel. adaptic/steri-strips over apligraft to lower leg Wound #5 (Right, Medial Lower Leg) 1. Cleanse With Wound Cleanser Soap and water 2. Periwound Care Moisturizing lotion 3. Primary Dressing Applied Cellular Based Tissue Product Other primary dressing (specifiy in notes) 4. Secondary Dressing Dry Gauze 6. Support Layer Applied Kerlix/Coban Notes adaptic/steri-strips over apligraft to lower leg applied by MD. Engineer, maintenance) Signed: 03/21/2020 5:18:21 PM By: Levan Hurst RN, BSN Signed: 03/21/2020 5:34:39 PM By: Linton Ham MD Entered By: Linton Ham on 03/21/2020 10:57:05 -------------------------------------------------------------------------------- Multi-Disciplinary Care Plan Details Patient Name: Date of Service: REIA, VIERNES  03/21/2020 9:00 AM Medical Record Number:[id number] Patient Account Number: [id number] Date of Birth/Sex: Treating RN: [birth year] (69 y.o. [name] [name] Primary Care Shebra Muldrow: [name] Other Clinician: Referring Sydnei Ohaver: Treating Woodrow Dulski/Extender:Robson, Leotis Shames, MARY Weeks in Treatment: 417 Active Inactive Wound/Skin Impairment Nursing Diagnoses: Impaired tissue integrity Knowledge deficit related to ulceration/compromised skin integrity Goals: Patient/caregiver will verbalize understanding of skin care regimen Date Initiated: 10/11/2016 Target Resolution Date: 04/08/2020 Goal Status: Active Interventions: Assess patient/caregiver ability to obtain necessary supplies Assess patient/caregiver ability to perform ulcer/skin care regimen upon admission and as needed Assess ulceration(s) every visit Provide education on ulcer and skin care Treatment Activities: Skin care regimen initiated : 10/11/2016 Topical wound management initiated : 10/11/2016 Notes: Electronic Signature(s) Signed: 03/21/2020 5:18:21 PM By: Levan Hurst RN, BSN Entered By: Levan Hurst on 03/21/2020 10:02:03 -------------------------------------------------------------------------------- Pain Assessment Details Patient Name: Date of Service: [name] [name] 03/21/2020 9:00 AM Medical Record Number:[id number] Patient Account Number: [id number] Date of Birth/Sex: Treating RN: [birth year] (69 y.o. [name] [name] Primary Care Zylah Elsbernd: [name] Other Clinician: Referring Jette Lewan: Treating Omaria Plunk/Extender:Robson, Leotis Shames, MARY Weeks in Treatment: [phone] Active Problems Location of Pain Severity and Description of Pain Patient Has Paino No Site Locations Pain Management and Medication Current Pain Management: Electronic Signature(s) Signed: 03/21/2020 5:18:21 PM By: Levan Hurst RN, BSN Signed: 04/13/2020 9:21:08 AM By: Sandre Kitty Entered By: Sandre Kitty on  03/21/2020 09:33:55 -------------------------------------------------------------------------------- Patient/Caregiver Education Details Patient Name: Date of Service: [name] [name], [name] F. 3/22/2021andnbsp9:00 AM Medical Record Number:[id number]  Patient Account Number: [id number] Date of Birth/Gender: [birth year] (69 y.o. F) Treating RN: Levan Hurst Primary Care Physician: [name] Other Clinician: Referring Physician: Treating Physician/Extender:Robson, Leotis Shames, Timoteo Expose in Treatment: [phone] Education Assessment Education Provided To: Patient Education Topics Provided Wound/Skin Impairment: Methods: Explain/Verbal Responses: State content correctly Electronic Signature(s) Signed: 03/21/2020 5:18:21 PM By: Levan Hurst RN, BSN Entered By: Levan Hurst on 03/21/2020 10:02:21 -------------------------------------------------------------------------------- Wound Assessment Details Patient Name: Date of Service: Mccartin, [name] F. 03/21/2020 9:00 AM Medical Record Number:[id number] Patient Account Number: [id number] Date of Birth/Sex: Treating RN: 11-Jan-1951 (69 y.o. [name] [name] Primary Care Lasaundra Riche: [name] Other Clinician: Referring Brilee Port: Treating Kawon Willcutt/Extender:Robson, Leotis Shames, MARY Weeks in Treatment: 417 Wound Status Wound Number: 16 Primary Pressure Ulcer Etiology: Wound Location: Right Calcaneus Wound Open Wounding Event: Pressure Injury Status: Date Acquired: 01/04/2020 Comorbid Anemia, Hypertension, Peripheral Arterial Weeks Of Treatment: 11 History: Disease, Peripheral Venous Disease, Clustered Wound: No Raynauds, Scleroderma, Rheumatoid Arthritis, Osteoarthritis Photos Photo Uploaded By: Mikeal Hawthorne on 03/22/2020 14:25:58 Wound Measurements Length: (cm) 1.8 % Reduction Width: (cm) 1.5 % Reduction Depth: (cm) 0.1 Epithelializ Area: (cm) 2.121 Tunneling: Volume: (cm) 0.212 Undermining Wound Description Classification:  Unstageable/Unclassified Foul Odor Wound Margin: Flat and Intact Slough/Fib Exudate Amount: Medium Exudate Type: Serosanguineous Exudate Color: red, brown Wound Bed Granulation Amount: None Present (0%) Necrotic Amount: Large (67-100%) Fascia Expo Necrotic Quality: Eschar Fat Layer ( Tendon Expo Muscle Expo J B After Cleansing: No rino No Exposed Structure sed: No Subcutaneous Tissue) Exposed: No sed: No sed: No oint Exposed: No one Exposed: No in Area: 83.1% in Volume: 83.1% ation: None No : No Treatment Notes Wound #16 (Right Calcaneus) 1. Cleanse With Wound Cleanser Soap and water 2. Periwound Care Moisturizing lotion 3. Primary Dressing Applied Other primary dressing (specifiy in notes) 4. Secondary Dressing Dry Gauze 6. Support Layer Applied Kerlix/Coban Notes betadine to heel. adaptic/steri-strips over apligraft to lower leg Electronic Signature(s) Signed: 03/21/2020 5:18:21 PM By: Levan Hurst RN, BSN Entered By: Levan Hurst on 03/21/2020 09:48:20 -------------------------------------------------------------------------------- Wound Assessment Details Patient Name: Date of Service: [name] [name] 03/21/2020 9:00 AM Medical Record Number:[id number] Patient Account Number: [id number] Date of Birth/Sex: Treating RN: [birth year] (69 y.o. [name] [name] Primary Care Advika Mclelland: [name] Other Clinician: Referring Mussa Groesbeck: Treating Eliabeth Shoff/Extender:Robson, Leotis Shames, MARY Weeks in Treatment: 417 Wound Status Wound Number: 5 Primary Venous Leg Ulcer Etiology: Wound Location: Right Lower Leg - Medial Wound Open Wounding Event: Gradually Appeared Status: Date Acquired: 01/13/2013 Comorbid Anemia, Hypertension, Peripheral Arterial Weeks Of Treatment: 373 History: Disease, Peripheral Venous Disease, Clustered Wound: Yes Raynauds, Scleroderma, Rheumatoid Arthritis, Osteoarthritis Photos Photo Uploaded By: Mikeal Hawthorne on 03/22/2020  14:25:59 Wound Measurements Length: (cm) 1.9 % R Width: (cm) 1.5 % R Depth: (cm) 0.3 Epi Clustered Quantity: 1 Tun Area: (cm) 2.238 Un Volume: (cm) 0.672 Wound Description Classification: Full Thickness With Exposed Support Structures Wound Well defined, not attached Margin: Exudate Medium Amount: Exudate Type: Serosanguineous Exudate Color: red, brown Wound Bed Granulation Amount: Medium (34-66%) Granulation Quality: Pink Necrotic Amount: Medium (34-66%) Necrotic Quality: Adherent Slough Foul Odor After Cleansing: No Slough/Fibrino Yes Exposed Structure Fascia Exposed: No Fat Layer (Subcutaneous Tissue) Exposed: Yes Tendon Exposed: No Muscle Exposed: Yes Necrosis of Muscle: No Joint Exposed: No Bone Exposed: No eduction in Area: -58.3% eduction in Volume: -376.6% thelialization: Small (1-33%) neling: No dermining: No Treatment Notes Wound #5 (Right, Medial Lower Leg) 1. Cleanse With Wound Cleanser Soap and water 2. Periwound Care Moisturizing lotion 3. Primary Dressing Applied Cellular  Based Tissue Product Other primary dressing (specifiy in notes) 4. Secondary Dressing Dry Gauze 6. Support Layer Applied Kerlix/Coban Notes adaptic/steri-strips over apligraft to lower leg applied by MD. Engineer, maintenance) Signed: 03/21/2020 5:18:21 PM By: Levan Hurst RN, BSN Entered By: Levan Hurst on 03/21/2020 09:49:07 -------------------------------------------------------------------------------- [place] Details Patient Name: Date of Service: [name] [name]. 03/21/2020 9:00 AM Medical Record Number:[id number] Patient Account Number: [id number] Date of Birth/Sex: Treating RN: 1951/03/30 (69 y.o. [name] [name] Primary Care Lathon Adan: [name] Other Clinician: Referring Arrie Borrelli: Treating Anh Bigos/Extender:Robson, Leotis Shames, MARY Weeks in Treatment: 417 Vital Signs Time Taken: 09:33 Temperature (F): 98.3 Height (in): 68 Pulse (bpm):  69 Weight (lbs): 132 Respiratory Rate (breaths/min): 18 Body Mass Index (BMI): 20.1 Blood Pressure (mmHg): 159/46 Reference Range: 80 - 120 mg / dl Electronic Signature(s) Signed: 04/13/2020 9:21:08 AM By: Sandre Kitty Entered By: Sandre Kitty on 03/21/2020 09:33:46

## 2020-04-13 NOTE — Progress Notes (Signed)
[name] [name] ([id number]) Visit Report for 03/07/2020 Arrival Information Details Patient Name: Date of Service: [name] [name], [name] [name] 03/07/2020 9:00 AM Medical Record Number:[id number] Patient Account Number: [id number] Date of Birth/Sex: Treating RN: 01/28/1951 (69 y.o. [name] [name] Primary Care Gitel Beste: [name] Other Clinician: Referring Selda Jalbert: Treating Julliette Frentz/Extender:Robson, Leotis Shames, MARY Weeks in Treatment: 47 Visit Information History Since Last Visit Added or deleted any medications: No Patient Arrived: Wheel Chair Any new allergies or adverse reactions: No Arrival Time: 09:31 Had a fall or experienced change in No activities of daily living that may affect Accompanied By: husband risk of falls: Transfer Assistance: None Signs or symptoms of abuse/neglect since last No Patient Identification Verified: Yes visito Secondary Verification Process Completed: Yes Hospitalized since last visit: No Patient Requires Transmission-Based No Implantable device outside of the clinic excluding No Precautions: cellular tissue based products placed in the center Patient Has Alerts: No since last visit: Has Dressing in Place as Prescribed: Yes Pain Present Now: No Electronic Signature(s) Signed: 04/13/2020 9:21:08 AM By: Sandre Kitty Entered By: Sandre Kitty on 03/07/2020 09:32:17 -------------------------------------------------------------------------------- Encounter Discharge Information Details Patient Name: Date of Service: [name] [name]. 03/07/2020 9:00 AM Medical Record Number:[id number] Patient Account Number: [id number] Date of Birth/Sex: Treating RN: [birth year] (69 y.o. [name] Primary Care Haden Cavenaugh: [name] Other Clinician: Referring Thinh Cuccaro: Treating Jayvyn Haselton/Extender:Robson, Leotis Shames, MARY Weeks in Treatment: [phone] Encounter Discharge Information Items Post Procedure Vitals Discharge Condition: Stable Temperature  (F): 98.2 Ambulatory Status: Wheelchair Pulse (bpm): 70 Discharge Destination: Home Respiratory Rate (breaths/min): 18 Transportation: Private Auto Blood Pressure (mmHg): 137/47 Accompanied By: family member Schedule Follow-up Appointment: Yes Clinical Summary of Care: Patient Declined Electronic Signature(s) Signed: 03/07/2020 5:31:36 PM By: Kela Millin Entered By: Kela Millin on 03/07/2020 10:11:41 -------------------------------------------------------------------------------- Lower Extremity Assessment Details Patient Name: Date of Service: [name] [name], [name] [name] 03/07/2020 9:00 AM Medical Record Number:[id number] Patient Account Number: [id number] Date of Birth/Sex: Treating RN: 1951-08-17 (69 y.o. [name] [name] Primary Care Annalisse Minkoff: [name] Other Clinician: Referring Rayshell Goecke: Treating Bain Whichard/Extender:Robson, Leotis Shames, MARY Weeks in Treatment: 415 Edema Assessment Assessed: [Left: No] [Right: Yes] Edema: [Left: Ye] [Right: s] Calf Left: Right: Point of Measurement: 36 cm From Medial Instep cm 31 cm Ankle Left: Right: Point of Measurement: 10 cm From Medial Instep cm 21 cm Electronic Signature(s) Signed: 03/07/2020 1:02:56 PM By: Deon Pilling Entered By: Deon Pilling on 03/07/2020 09:40:28 -------------------------------------------------------------------------------- Multi Wound Chart Details Patient Name: Date of Service: [name] [name] 03/07/2020 9:00 AM Medical Record Number:[id number] Patient Account Number: [id number] Date of Birth/Sex: Treating RN: 02-23-1951 (69 y.o. 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] [7:Venous Leg Ulcer] Comorbid History: [16:Anemia, Hypertension, Peripheral Arterial Disease, Peripheral Arterial Disease, Peripheral Arterial Disease, Peripheral Venous Disease, Peripheral Venous Disease, Peripheral Venous Disease, Raynauds, Scleroderma, Raynauds,  Scleroderma, Raynauds, Scleroderma, Rheumatoid Arthritis, Osteoarthritis] [5:Anemia, Hypertension, Rheumatoid Arthritis, Osteoarthritis] [7:Anemia, Hypertension, Rheumatoid Arthritis, Osteoarthritis] Date Acquired: [16:01/04/2020] [5:01/13/2013] [7:06/25/2016] Weeks of Treatment: [16:9] [9:292] [7:191] Wound Status: [16:Open] [5:Open] [7:Healed - Epithelialized] Clustered Wound: [16:No] [5:Yes] [7:No] Clustered Quantity: [16:N/A] [5:1] [7:N/A] Measurements L x W x D 1.2x1.5x0.1 [5:2x1.5x0.3] [7:0x0x0] (  cm) Area (cm) : [16:1.414] [5:2.356] [7:0] Volume (cm) : [16:0.141] [9:4.854] [7:0] % Reduction in Area: [16:88.70%] [5:-66.60%] [7:100.00%] % Reduction in Volume: 88.80% [5:-401.40%] [7:100.00%] Classification: [16:Unstageable/Unclassified Full Thickness With] [5:Exposed Support Structures Exposed Support Structures] [7:Full Thickness Without] Exudate Amount: [16:Medium] [5:Medium] [7:None Present] Exudate Type: [16:Serosanguineous] [5:Serosanguineous] [7:N/A] Exudate Color: [16:red, brown] [5:red, brown] [7:N/A] Wound Margin: [16:Flat and Intact] [5:Well defined, not attached Well defined, not attached] Granulation Amount: [16:None Present (0%)] [5:Medium (34-66%)] [7:None Present (0%)] Granulation Quality: [16:N/A] [5:Pink, Pale] [7:N/A] Necrotic Amount: [16:Large (67-100%)] [5:Medium (34-66%)] [7:None Present (0%)] Necrotic Tissue: [16:Eschar] [5:Adherent Slough]  [7:N/A] Exposed Structures: [16:Fascia: No Fat Layer (Subcutaneous Tissue) Exposed: Yes Tissue) Exposed: No Tendon: No Muscle: No Joint: No Bone: No] [5:Fat Layer (Subcutaneous Fascia: No Muscle: Yes Fascia: No Tendon: No Joint: No Bone: No] [7:Fat Layer (Subcutaneous Tissue)  Exposed: No Tendon: No Muscle: No Joint: No Bone: No] Epithelialization: [16:None] [5:Small (1-33%)] [7:Large (67-100%)] Debridement: [16:N/A] [5:Debridement - Excisional N/A] Pre-procedure [16:N/A] [5:09:50] [7:N/A] Verification/Time Out Taken: Tissue Debrided: [16:N/A] [5:Subcutaneous, Slough] [7:N/A] Level: [16:N/A] [5:Skin/Subcutaneous Tissue N/A] Debridement Area (sq cm):N/A [5:3] [7:N/A] Instrument: [16:N/A] [5:Curette] [7:N/A] Bleeding: [16:N/A] [5:Minimum] [7:N/A] Hemostasis Achieved: [16:N/A] [5:Pressure] [7:N/A] Procedural Pain: [16:N/A] [5:0] [7:N/A] Post Procedural Pain: [16:N/A] [5:0] [7:N/A] Debridement Treatment N/A [5:Procedure was tolerated] [7:N/A] Response: [5:well] Post Debridement [16:N/A] [5:2x1.5x0.3] [7:N/A] Measurements L x W x D (cm) Post Debridement [16:N/A] [5:0.707] [7:N/A] Volume: (cm) Procedures Performed: N/A [5:Cellular or Tissue Based Product Debridement] [7:N/A] Treatment Notes Wound #16 (Right Calcaneus) 1. Cleanse With Wound Cleanser Soap and water 3. Primary Dressing Applied Cellular Based Tissue Product Other primary dressing (specifiy in notes) 4. Secondary Dressing Other secondary dressing (specify in notes) 6. Support Layer Applied Kerlix/Coban Notes betadine to heel. adaptic/steri-strips over apligraft to lower leg Wound #5 (Right, Medial Lower Leg) 1. Cleanse With Wound Cleanser Soap and water 3. Primary Dressing Applied Cellular Based Tissue Product Other primary dressing (specifiy in notes) 4. Secondary Dressing Other secondary dressing (specify in notes) 6. Support Layer Applied Kerlix/Coban Notes betadine to heel. adaptic/steri-strips over  apligraft to lower leg Electronic Signature(s) Signed: 03/07/2020 5:29:15 PM By: Linton Ham MD Signed: 03/07/2020 5:59:01 PM By: Levan Hurst RN, BSN Entered By: Linton Ham on 03/07/2020 10:15:00 -------------------------------------------------------------------------------- Multi-Disciplinary Care Plan Details Patient Name: Date of Service: [name] [name]. 03/07/2020 9:00 AM Medical Record Number:[id number] Patient Account Number: [id number] Date of Birth/Sex: Treating RN: [birth year] (69 y.o. [name] [name] Primary Care Chena Chohan: [name] Other Clinician: Referring Meesha Sek: Treating Lejon Afzal/Extender:Robson, Leotis Shames, MARY Weeks in Treatment: [phone] Active Inactive Wound/Skin Impairment Nursing Diagnoses: Impaired tissue integrity Knowledge deficit related to ulceration/compromised skin integrity Goals: Patient/caregiver will verbalize understanding of skin care regimen Date Initiated: 10/11/2016 Target Resolution Date: 04/08/2020 Goal Status: Active Interventions: Assess patient/caregiver ability to obtain necessary supplies Assess patient/caregiver ability to perform ulcer/skin care regimen upon admission and as needed Assess ulceration(s) every visit Provide education on ulcer and skin care Treatment Activities: Skin care regimen initiated : 10/11/2016 Topical wound management initiated : 10/11/2016 Notes: Electronic Signature(s) Signed: 03/07/2020 5:59:01 PM By: Levan Hurst RN, BSN Entered By: Levan Hurst on 03/07/2020 10:04:05 -------------------------------------------------------------------------------- Pain Assessment Details Patient Name: Date of Service: [name] [name] 03/07/2020 9:00 AM Medical Record Number:[id number] Patient Account Number: [id number] Date of Birth/Sex: Treating RN: 1951-05-23 (69 y.o. [name] [name] Primary Care Pinchus Weckwerth: [name] Other Clinician: Referring Yoshino Broccoli: Treating Okie Jansson/Extender:Robson,  Leotis Shames, MARY Weeks in Treatment: [phone] Active Problems Location of Pain Severity and Description of Pain Patient Has  Paino No Site Locations Pain Management and Medication Current Pain Management: Electronic Signature(s) Signed: 03/07/2020 5:59:01 PM By: Levan Hurst RN, BSN Signed: 04/13/2020 9:21:08 AM By: Sandre Kitty Entered By: Sandre Kitty on 03/07/2020 09:34:22 -------------------------------------------------------------------------------- Patient/Caregiver Education Details Patient Name: Date of Service: Kulzer, Jeslynn F. 3/8/2021andnbsp9:00 AM Medical Record Number:[id number] Patient Account Number: [id number] Date of Birth/Gender: 1951/02/08 (68 y.o. F) Treating RN: Levan Hurst Primary Care Physician: [name] Other Clinician: Referring Physician: Treating Physician/Extender:Robson, Leotis Shames, Timoteo Expose in Treatment: [phone] Education Assessment Education Provided To: Patient Education Topics Provided Wound/Skin Impairment: Methods: Explain/Verbal Responses: State content correctly Electronic Signature(s) Signed: 03/07/2020 5:59:01 PM By: Levan Hurst RN, BSN Entered By: Levan Hurst on 03/07/2020 10:04:37 -------------------------------------------------------------------------------- Wound Assessment Details Patient Name: Date of Service: [name] [name], [name] F. 03/07/2020 9:00 AM Medical Record Number:[id number] Patient Account Number: [id number] Date of Birth/Sex: Treating RN: 01-07-1951 (69 y.o. [name] [name] Primary Care Jerilyn Gillaspie: [name] Other Clinician: Referring Aundrea Higginbotham: Treating Zykiria Bruening/Extender:Robson, Leotis Shames, MARY Weeks in Treatment: 415 Wound Status Wound Number: 16 Primary Pressure Ulcer Etiology: Wound Location: Right Calcaneus Wound Open Wounding Event: Pressure Injury Status: Date Acquired: 01/04/2020 Comorbid Anemia, Hypertension, Peripheral Arterial Weeks Of Treatment: 9 History: Disease, Peripheral  Venous Disease, Clustered Wound: No Raynauds, Scleroderma, Rheumatoid Arthritis, Osteoarthritis Photos Wound Measurements Length: (cm) 1.2 % Reduction in A Width: (cm) 1.5 % Reduction in V Depth: (cm) 0.1 Epithelializatio Area: (cm) 1.414 Undermining: Volume: (cm) 0.141 Wound Description Classification: Unstageable/Unclassified Foul Odor After Wound Margin: Flat and Intact Slough/Fibrino Exudate Amount: Medium Exudate Type: Serosanguineous Exudate Color: red, brown Wound Bed Granulation Amount: None Present (0%) Necrotic Amount: Large (67-100%) Fascia Exposed: Necrotic Quality: Eschar Fat Layer (Subcu Tendon Exposed: Muscle Exposed: Joint Exposed: Bone Exposed: Cleansing: No No Exposed Structure No taneous Tissue) Exposed: _0  rea: 88.7% olume: 88.8% n: None No Electronic Signature(s) Signed: 03/14/2020 5:11:29 PM By: Mikeal Hawthorne EMT/HBOT Signed: 04/13/2020 9:04:28 AM By: Levan Hurst RN, BSN Previous Signature: 03/07/2020 1:02:56 PM Version By: Deon Pilling Entered By: Mikeal Hawthorne on 03/14/2020 15:54:40 -------------------------------------------------------------------------------- Wound Assessment Details Patient Name: Date of Service: [name] [name] 03/07/2020 9:00 AM Medical Record Number:[id number] Patient Account Number: [id number] Date of Birth/Sex: Treating RN: [birth year] (69 y.o. [name] [name] Primary Care Rosabella Edgin: [name] Other Clinician: Referring Athony Coppa: Treating Kinda Pottle/Extender:Robson, Leotis Shames, MARY Weeks in Treatment: 415 Wound Status Wound Number: 5 Primary Venous Leg Ulcer Etiology: Wound Location: Right, Medial Lower Leg Wound Open Wounding Event: Gradually Appeared Status: Date Acquired: 01/13/2013 Comorbid Anemia, Hypertension, Peripheral Arterial Weeks Of Treatment: 371 History: Disease, Peripheral Venous Disease, Clustered Wound: Yes Raynauds, Scleroderma, Rheumatoid Arthritis,  Osteoarthritis Photos Wound Measurements Length: (cm) 2 % Reducti Width: (cm) 1.5 % Reducti Depth: (cm) 0.3 Epithelia Clustered Quantity: 1 Tunneling Area: (cm) 2.356 Undermin Volume: (cm) 0.707 Wound Description Classification: Full Thickness With Exposed Support Foul Odo Structures Slough/F Wound Well defined, not attached Margin: Exudate Medium Amount: Exudate Type: Serosanguineous Exudate Color: red, brown Wound Bed Granulation Amount: Medium (34-66%) Granulation Quality: Pink, Pale Fascia Ex Necrotic Amount: Medium (34-66%) Fat Layer Necrotic Quality: Adherent Slough Tendon Ex Muscle Ex Necr Joint Exp Bone Expo r After Cleansing: No ibrino Yes Exposed Structure posed: No (Subcutaneous Tissue) Exposed: Yes posed: No posed: Yes osis of Muscle: No osed: No sed: No on in Area: -66.6% on in Volume: -401.4% lization: Small (1-33%) : No ing: No Electronic Signature(s) Signed: 03/14/2020 5:11:29 PM By: Mikeal Hawthorne EMT/HBOT Signed: 04/13/2020 9:04:28 AM By: Levan Hurst RN, BSN Previous  Signature: 03/07/2020 1:02:56 PM Version By: Deon Pilling Entered By: Mikeal Hawthorne on 03/14/2020 15:54:08 -------------------------------------------------------------------------------- Wound Assessment Details Patient Name: Date of Service: [name] [name], [name] [name] 03/07/2020 9:00 AM Medical Record Number:[id number] Patient Account Number: [id number] Date of Birth/Sex: Treating RN: 07-26-1951 (69 y.o. [name] [name] Primary Care Sophia Cubero: [name] Other Clinician: Referring Navika Hoopes: Treating Allisa Einspahr/Extender:Robson, Leotis Shames, MARY Weeks in Treatment: 415 Wound Status Wound Number: 7 Primary Venous Leg Ulcer Etiology: Wound Location: Right Malleolus - Lateral Wound Healed - Epithelialized Wounding Event: Gradually Appeared Status: Date Acquired: 06/25/2016 Comorbid Anemia, Hypertension, Peripheral Arterial Weeks Of Treatment: 191 History: Disease, Peripheral  Venous Disease, Clustered Wound: No Raynauds, Scleroderma, Rheumatoid Arthritis, Osteoarthritis Photos Wound Measurements Length: (cm) 0 % Reducti Width: (cm) 0 % Reducti Depth: (cm) 0 Epithelia Area: (cm) 0 Tunnelin Volume: (cm) 0 Undermin Wound Description Classification: Full Thickness Without Exposed Support Foul Odo Structures Slough/F Wound Well defined, not attached Margin: Exudate None Present Amount: Wound Bed Granulation Amount: None Present (0%) Necrotic Amount: None Present (0%) Fascia E Fat Laye Tendon E Muscle E Joint Ex Bone Exp r After Cleansing: No ibrino No Exposed Structure xposed: No r (Subcutaneous Tissue) Exposed: No xposed: No xposed: No posed: No osed: No on in Area: 100% on in Volume: 100% lization: Large (67-100%) g: No ing: No Electronic Signature(s) Signed: 03/14/2020 5:11:29 PM By: Mikeal Hawthorne EMT/HBOT Signed: 04/13/2020 9:04:28 AM By: Levan Hurst RN, BSN Previous Signature: 03/07/2020 5:59:01 PM Version By: Levan Hurst RN, BSN Entered By: Mikeal Hawthorne on 03/14/2020 15:55:15 -------------------------------------------------------------------------------- Vitals Details Patient Name: Date of Service: [name] [name]. 03/07/2020 9:00 AM Medical Record Number:[id number] Patient Account Number: [id number] Date of Birth/Sex: Treating RN: 26-Mar-1951 (69 y.o. [name] [name] Primary Care Pessy Delamar: [name] Other Clinician: Referring Ozelle Brubacher: Treating Nohealani Medinger/Extender:Robson, Leotis Shames, MARY Weeks in Treatment: 415 Vital Signs Time Taken: 09:32 Temperature (F): 98.2 Height (in): 68 Pulse (bpm): 70 Weight (lbs): 132 Respiratory Rate (breaths/min): 18 Body Mass Index (BMI): 20.1 Blood Pressure (mmHg): 137/47 Reference Range: 80 - 120 mg / dl Electronic Signature(s) Signed: 04/13/2020 9:21:08 AM By: Sandre Kitty Entered By: Sandre Kitty on 03/07/2020 09:34:04

## 2020-04-14 DIAGNOSIS — S32592D Other specified fracture of left pubis, subsequent encounter for fracture with routine healing: Secondary | ICD-10-CM | POA: Diagnosis not present

## 2020-04-14 DIAGNOSIS — I739 Peripheral vascular disease, unspecified: Secondary | ICD-10-CM | POA: Diagnosis not present

## 2020-04-14 DIAGNOSIS — S52592D Other fractures of lower end of left radius, subsequent encounter for closed fracture with routine healing: Secondary | ICD-10-CM | POA: Diagnosis not present

## 2020-04-14 DIAGNOSIS — S62102D Fracture of unspecified carpal bone, left wrist, subsequent encounter for fracture with routine healing: Secondary | ICD-10-CM | POA: Diagnosis not present

## 2020-04-14 DIAGNOSIS — G629 Polyneuropathy, unspecified: Secondary | ICD-10-CM | POA: Diagnosis not present

## 2020-04-14 DIAGNOSIS — M349 Systemic sclerosis, unspecified: Secondary | ICD-10-CM | POA: Diagnosis not present

## 2020-04-18 ENCOUNTER — Other Ambulatory Visit: Payer: Self-pay

## 2020-04-18 ENCOUNTER — Encounter (HOSPITAL_BASED_OUTPATIENT_CLINIC_OR_DEPARTMENT_OTHER): Payer: Medicare Other | Admitting: Internal Medicine

## 2020-04-18 DIAGNOSIS — I87331 Chronic venous hypertension (idiopathic) with ulcer and inflammation of right lower extremity: Secondary | ICD-10-CM | POA: Diagnosis not present

## 2020-04-18 DIAGNOSIS — L89616 Pressure-induced deep tissue damage of right heel: Secondary | ICD-10-CM | POA: Diagnosis not present

## 2020-04-18 DIAGNOSIS — L97812 Non-pressure chronic ulcer of other part of right lower leg with fat layer exposed: Secondary | ICD-10-CM | POA: Diagnosis not present

## 2020-04-18 DIAGNOSIS — I872 Venous insufficiency (chronic) (peripheral): Secondary | ICD-10-CM | POA: Diagnosis not present

## 2020-04-18 DIAGNOSIS — L97811 Non-pressure chronic ulcer of other part of right lower leg limited to breakdown of skin: Secondary | ICD-10-CM | POA: Diagnosis not present

## 2020-04-18 DIAGNOSIS — L97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: Secondary | ICD-10-CM | POA: Diagnosis not present

## 2020-04-18 DIAGNOSIS — I83222 Varicose veins of left lower extremity with both ulcer of calf and inflammation: Secondary | ICD-10-CM | POA: Diagnosis not present

## 2020-04-18 NOTE — Progress Notes (Signed)
IDOLINA, MANTELL ([id number]) Visit Report for 04/18/2020 Arrival Information Details Patient Name: Date of Service: [name] [name], [name] [name] 04/18/2020 9:00 AM Medical Record Number:[id number] Patient Account Number: [id number] Date of Birth/Sex: Treating RN: 1951/03/08 (69 y.o. Hollie Salk, Shannon Primary Care Ahlaya Ende: [name] Other Clinician: Referring Esmae Donathan: Treating Cambell Rickenbach/Extender:Robson, Leotis Shames, MARY Weeks in Treatment: 85 Visit Information History Since Last Visit Added or deleted any medications: No Patient Arrived: Wheel Chair Any new allergies or adverse reactions: No Arrival Time: 09:07 Had a fall or experienced change in No activities of daily living that may affect Accompanied By: husband risk of falls: Transfer Assistance: None Signs or symptoms of abuse/neglect since last No Patient Identification Verified: Yes visito Secondary Verification Process Completed: Yes Hospitalized since last visit: No Patient Requires Transmission-Based No Implantable device outside of the clinic excluding No Precautions: cellular tissue based products placed in the center Patient Has Alerts: No since last visit: Has Dressing in Place as Prescribed: Yes Has Compression in Place as Prescribed: Yes Pain Present Now: No Electronic Signature(s) Signed: 04/18/2020 5:36:36 PM By: Kela Millin Entered By: Kela Millin on 04/18/2020 09:07:51 -------------------------------------------------------------------------------- Encounter Discharge Information Details Patient Name: Date of Service: [name]. 04/18/2020 9:00 AM Medical Record Number:[id number] Patient Account Number: [id number] Date of Birth/Sex: Treating RN: [birth year] (69 y.o. [name] Primary Care Aldean Pipe: [name] Other Clinician: Referring Yoshiaki Kreuser: Treating Ramsie Ostrander/Extender:Robson, Leotis Shames, MARY Weeks in Treatment: [phone] Encounter Discharge Information Items Post Procedure  Vitals Discharge Condition: Stable Temperature (F): 98.4 Ambulatory Status: Wheelchair Pulse (bpm): 74 Discharge Destination: Home Respiratory Rate (breaths/min): 19 Transportation: Private Auto Blood Pressure (mmHg): 152/52 Accompanied By: husband Schedule Follow-up Appointment: Yes Clinical Summary of Care: Electronic Signature(s) Signed: 04/18/2020 5:42:42 PM By: Deon Pilling Entered By: Deon Pilling on 04/18/2020 10:11:09 -------------------------------------------------------------------------------- Lower Extremity Assessment Details Patient Name: Date of Service: MEKAYLAH, KLICH 04/18/2020 9:00 AM Medical Record Number:[id number] Patient Account Number: [id number] Date of Birth/Sex: Treating RN: September 13, 1951 (69 y.o. [name] Primary Care Tieshia Rettinger: [name] Other Clinician: Referring Ugochi Henzler: Treating Thecla Forgione/Extender:Robson, Leotis Shames, MARY Weeks in Treatment: 421 Edema Assessment Assessed: [Left: No] [Right: No] Edema: [Left: Ye] [Right: s] Calf Left: Right: Point of Measurement: 36 cm From Medial Instep cm 31.5 cm Ankle Left: Right: Point of Measurement: 10 cm From Medial Instep cm 21.5 cm Vascular Assessment Pulses: Dorsalis Pedis Palpable: [Right:Yes] Electronic Signature(s) Signed: 04/18/2020 5:36:36 PM By: Kela Millin Entered By: Kela Millin on 04/18/2020 09:12:01 -------------------------------------------------------------------------------- Multi Wound Chart Details Patient Name: Date of Service: [name]. 04/18/2020 9:00 AM Medical Record Number:[id number] Patient Account Number: [id number] Date of Birth/Sex: Treating RN: [birth year] (69 y.o. 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] [18:Venous Leg Ulcer] Comorbid History: [16:Anemia, Hypertension, Peripheral Arterial Disease, Peripheral Arterial Disease, Peripheral Arterial Disease, Peripheral Venous Disease, Peripheral Venous Disease, Peripheral Venous Disease, Raynauds, Scleroderma, Raynauds,  Scleroderma, Raynauds, Scleroderma, Rheumatoid Arthritis, Osteoarthritis] [17:Anemia, Hypertension, Rheumatoid Arthritis, Osteoarthritis] [18:Anemia, Hypertension, Rheumatoid Arthritis, Osteoarthritis] Date Acquired: [16:01/04/2020] [17:04/18/2020] [18:04/18/2020] Weeks of Treatment: [16:15] [17:0] [18:0] Wound Status: [16:Open] [17:Open] [18:Open] Clustered Wound: [16:No] [17:No] [18:No] Measurements L x W x D  1.2x1.7x0.1 [17:1.2x1x0.1] [18:1x0.5x0.1] (cm) Area (cm) : [16:1.602] [17:0.942] [18:0.393] Volume (cm) : [16:0.16] [17:0.094] [18:0.039] % Reduction in Area: [16:87.30%] [17:N/A] [18:N/A] % Reduction in Volume: 87.30% [17:N/A] [18:N/A] Classification: [16:Unstageable/Unclassified Full Thickness Without] [17:Exposed Support Structures Exposed Support Structures] [18:Full Thickness Without] Exudate Amount: [16:Small] [17:Medium] [18:Small] Exudate Type: [16:Serous] [17:Serous] [18:Serous] Exudate Color: [16:amber] [17:amber] [18:amber] Wound Margin: [16:Distinct, outline attached Distinct, outline attached Distinct, outline attached] Granulation Amount: [16:Small (1-33%)] [17:Large (67-100%)] [18:Large (67-100%)] Granulation Quality: [16:Pink] [17:Pink] [18:Pink] Necrotic Amount: [16:Large (67-100%)] [17:Small (1-33%)] [18:None Present (0%)] Necrotic Tissue:  [16:Eschar, Adherent Slough Adherent Slough] [18:N/A] Exposed Structures: [16:Fascia: No Fat Layer (Subcutaneous Tissue) Exposed: Yes Tissue) Exposed: No Tendon: No Muscle: No Joint: No Bone: No] [17:Fat Layer (Subcutaneous Fat Layer (Subcutaneous Fascia: No Tendon: No Muscle: No Joint: No Bone: No] [18:Tissue) Exposed: Yes  Fascia: No Tendon: No Muscle: No Joint: No Bone: No] Epithelialization: [16:None] [17:None] [18:None] Debridement: [16:N/A] [17:N/A] [18:N/A] Tissue Debrided: [16:N/A] [17:N/A] [18:N/A] Level: [16:N/A] [17:N/A] [18:N/A] Debridement Area (sq cm):N/A [17:N/A] [18:N/A] Instrument: [16:N/A] [17:N/A] [18:N/A] Bleeding: [16:N/A] [17:N/A] [18:N/A] Hemostasis Achieved: [16:N/A] [17:N/A] [18:N/A] Procedural Pain: [16:N/A] [17:N/A N/A] Post Procedural Pain: [16:N/A] [17:N/A N/A] Debridement Treatment [16:N/A] [17:N/A N/A] Response: Post Debridement [16:N/A] [17:N/A N/A] Measurements L x W x D (cm) Post Debridement [16:N/A] [17:N/A N/A] Volume: (cm) Procedures Performed: [16:N/A 5] [17:N/A N/A N/A] [18:N/A] Photos: [16:No Photos] [17:N/A N/A] Wound Location: [16:Right, Medial Lower Leg] [17:N/A N/A] Wounding Event: [16:Gradually Appeared] [17:N/A N/A] Primary Etiology: [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] [17:N/A N/A] Comorbid History: [16:Anemia, Hypertension, Peripheral Arterial Disease, Peripheral Venous Disease, Raynauds, Scleroderma, Rheumatoid Arthritis, Osteoarthritis] [17:N/A N/A] Date Acquired: [16:01/13/2013] [17:N/A N/A] Weeks of Treatment: [50:518] [17:N/A N/A] Wound Status: [16:Open] [17:N/A N/A] Clustered Wound: [16:Yes] [17:N/A N/A] Measurements L x W x D 2.2x2x0.2 [17:N/A N/A] (cm) Area (cm) : [16:3.456] [17:N/A N/A] Volume (cm) : [16:0.691] [17:N/A N/A] % Reduction in Area: [16:-144.40%] [17:N/A N/A] % Reduction in Volume: -390.10% [17:N/A N/A] Classification: [16:Full Thickness With Exposed Support Structures] [17:N/A N/A] Exudate Amount: [16:Medium] [17:N/A  N/A] Exudate Type: [16:Serous] [17:N/A N/A] Exudate Color: [16:amber] [17:N/A N/A] Wound Margin: [16:Well defined, not attached N/A] [17:N/A] Granulation Amount: [16:Medium (34-66%)] [17:N/A N/A] Granulation Quality: [16:Pink] [17:N/A N/A] Necrotic Amount: [16:Medium (34-66%)] [17:N/A N/A] Necrotic Tissue: [16:Adherent Slough] [17:N/A N/A] Exposed Structures: [16:Fat Layer (Subcutaneous N/A Tissue) Exposed: Yes Fascia: No Tendon: No Muscle: No Joint: No Bone: No] [17:N/A] Epithelialization: [16:None] [17:N/A N/A] Debridement: [16:Debridement - Excisional N/A] [17:N/A] Pre-procedure [16:09:40] [17:N/A N/A] Verification/Time Out Taken: Tissue Debrided: [16:Subcutaneous, Slough] [17:N/A N/A] Level: [16:Skin/Subcutaneous Tissue N/A] [17:N/A] Debridement Area (sq cm):4.4 [17:N/A N/A] Instrument: [16:Curette] [17:N/A N/A] Bleeding: [16:Minimum] [17:N/A N/A] Hemostasis Achieved: [16:Pressure] [17:N/A N/A] Procedural Pain: [16:0] [17:N/A N/A] Post Procedural Pain: [16:0] [17:N/A] [18:N/A] Debridement Treatment [16:Procedure was tolerated] [17:N/A] [18:N/A] Response: [16:well] Post Debridement [16:2.2x2x0.2] [17:N/A] [18:N/A] Measurements L x W x D (cm) Post Debridement [16:0.691] [17:N/A] [18:N/A] Volume: (cm) Procedures Performed: [16:Cellular or Tissue Based Product Debridement] [17:N/A] [18:N/A] Treatment Notes Electronic Signature(s) Signed: 04/18/2020 5:34:04 PM By: Linton Ham MD Signed: 04/18/2020 6:02:31 PM By: Levan Hurst RN, BSN Entered By: Linton Ham on 04/18/2020 09:51:04 -------------------------------------------------------------------------------- Multi-Disciplinary Care Plan Details Patient Name: Date of Service: [name]. 04/18/2020 9:00 AM Medical Record Number:[id number] Patient Account Number: [id number] Date of Birth/Sex: Treating RN: 01-Dec-1951 (69 y.o. [name] Primary Care Ruger Saxer: [name] Other Clinician: Referring  Hager Compston: Treating Rayyan Orsborn/Extender:Robson, Leotis Shames, MARY Weeks in Treatment: [phone] Active Inactive Wound/Skin Impairment Nursing Diagnoses: Impaired tissue integrity Knowledge deficit related to ulceration/compromised skin  integrity Goals: Patient/caregiver will verbalize understanding of skin care regimen Date Initiated: 10/11/2016 Target Resolution Date: 05/06/2020 Goal Status: Active Interventions: Assess patient/caregiver ability to obtain necessary supplies Assess patient/caregiver ability to perform ulcer/skin care regimen upon admission and as needed Assess ulceration(s) every visit Provide education on ulcer and skin care Treatment Activities: Skin care regimen initiated : 10/11/2016 Topical wound management initiated : 10/11/2016 Notes: Electronic Signature(s) Signed: 04/18/2020 6:02:31 PM By: Levan Hurst RN, BSN Entered By: Levan Hurst on 04/18/2020 09:22:12 -------------------------------------------------------------------------------- Pain Assessment Details Patient Name: Date of Service: [name] 04/18/2020 9:00 AM Medical Record Number:[id number] Patient Account Number: [id number] Date of Birth/Sex: Treating RN: 07-10-1951 (69 y.o. [name] Primary Care Jayquon Theiler: [name] Other Clinician: Referring Norberto Wishon: Treating Mitsy Owen/Extender:Robson, Leotis Shames, MARY Weeks in Treatment: [phone] Active Problems Location of Pain Severity and Description of Pain Patient Has Paino No Site Locations Pain Management and Medication Current Pain Management: Electronic Signature(s) Signed: 04/18/2020 5:36:36 PM By: Kela Millin Entered By: Kela Millin on 04/18/2020 09:14:14 -------------------------------------------------------------------------------- Patient/Caregiver Education Details Patient Name: Date of Service: Speth, Carynn F. 4/19/2021andnbsp9:00 AM Medical Record Number:[id number] Patient Account Number:  [id number] Date of Birth/Gender: 12-19-1951 (68 y.o. F) Treating RN: Levan Hurst Primary Care Physician: [name] Other Clinician: Referring Physician: Treating Physician/Extender:Robson, Leotis Shames, Timoteo Expose in Treatment: [phone] Education Assessment Education Provided To: Patient Education Topics Provided Wound/Skin Impairment: Methods: Explain/Verbal Responses: State content correctly Electronic Signature(s) Signed: 04/18/2020 6:02:31 PM By: Levan Hurst RN, BSN Entered By: Levan Hurst on 04/18/2020 09:23:38 -------------------------------------------------------------------------------- Wound Assessment Details Patient Name: Date of Service: [name] 04/18/2020 9:00 AM Medical Record Number:[id number] Patient Account Number: [id number] Date of Birth/Sex: Treating RN: Jan 07, 1951 (69 y.o. [name] Primary Care Rushi Chasen: [name] Other Clinician: Referring Fortunato Nordin: Treating Shavonne Ambroise/Extender:Robson, Leotis Shames, MARY Weeks in Treatment: 421 Wound Status Wound Number: 16 Primary Pressure Ulcer Etiology: Wound Location: Right Calcaneus Wound Open Wounding Event: Pressure Injury Status: Date Acquired: 01/04/2020 Comorbid Anemia, Hypertension, Peripheral Arterial Weeks Of Treatment: 15 History: Disease, Peripheral Venous Disease, Clustered Wound: No Raynauds, Scleroderma, Rheumatoid Arthritis, Osteoarthritis Wound Measurements Length: (cm) 1.2 Width: (cm) 1.7 Depth: (cm) 0.1 Area: (cm) 1.602 Volume: (cm) 0.16 Wound Description Classification: Unstageable/Unclassified Wound Margin: Distinct, outline attached Exudate Amount: Small Exudate Type: Serous Exudate Color: amber Wound Bed Granulation Amount: Small (1-33%) Granulation Quality: Pink Necrotic Amount: Large (67-100%) Necrotic Quality: Eschar, Adherent Slough or After Cleansing: No Fibrino Yes Exposed Structure xposed: No r (Subcutaneous Tissue) Exposed: No xposed:  No xposed: No posed: No osed: No % Reduction in Area: 87.3% % Reduction in Volume: 87.3% Epithelialization: None Tunneling: No Undermining: No Foul Od Slough/ Fascia E Fat Laye Tendon E Muscle E Joint Ex Bone Exp Treatment Notes Wound #16 (Right Calcaneus) 1. Cleanse With Wound Cleanser Soap and water 2. Periwound Care Moisturizing lotion 3. Primary Dressing Applied Other primary dressing (specifiy in notes) 4. Secondary Dressing Dry Gauze 6. Support Layer Applied Kerlix/Coban Notes betadine to heel. patient declined heel cup. Electronic Signature(s) Signed: 04/18/2020 5:36:36 PM By: Kela Millin Entered By: Kela Millin on 04/18/2020 09:21:28 -------------------------------------------------------------------------------- Wound Assessment Details Patient Name: Date of Service: [name] 04/18/2020 9:00 AM Medical Record Number:[id number] Patient Account Number: [id number] Date of Birth/Sex: Treating RN: 06-21-1951 (69 y.o. [name] Primary Care Azarian Starace: [name] Other Clinician: Referring Tynesha Free: Treating Curley Hogen/Extender:Robson, Leotis Shames, MARY Weeks in Treatment: 421 Wound Status Wound Number: 17 Primary Venous Leg Ulcer Etiology: Wound Location: Right, Anterior Lower Leg Wound Open Wounding Event: Gradually  Appeared Status: Date Acquired: 04/18/2020 Comorbid Anemia, Hypertension, Peripheral Arterial Weeks Of Treatment: 0 History: Disease, Peripheral Venous Disease, Clustered Wound: No Raynauds, Scleroderma, Rheumatoid Arthritis, Osteoarthritis Wound Measurements Length: (cm) 1.2 % Reducti Width: (cm) 1 % Reducti Depth: (cm) 0.1 Epithe Area: (cm) 0.942 Tunne Volume: (cm) 0.094 Under Wound Description Classification: Full Thickness Without Exposed Support Foul O Structures Slough Wound Distinct, outline attached Margin: Exudate Medium Amount: Exudate Serous Type: Exudate amber Color: Wound  Bed Granulation Amount: Large (67-100%) Granulation Quality: Pink Fascia Necrotic Amount: Small (1-33%) Fat La Necrotic Quality: Adherent Slough Tendon Muscle Joint Bone E dor After Cleansing: No /Fibrino Yes Exposed Structure Exposed: No yer (Subcutaneous Tissue) Exposed: Yes Exposed: No Exposed: No Exposed: No xposed: No on in Area: on in Volume: lialization: None ling: No mining: No Treatment Notes Wound #17 (Right, Anterior Lower Leg) 1. Cleanse With Wound Cleanser Soap and water 2. Periwound Care Moisturizing lotion 4. Secondary Dressing Dry Gauze 6. Support Layer Applied Kerlix/Coban Notes applied gauze over wounds as directed by MD. No primary dressing Salicylic Acid applied related to not carried in clinlic. Electronic Signature(s) Signed: 04/18/2020 5:36:36 PM By: Kela Millin Entered By: Kela Millin on 04/18/2020 09:19:11 -------------------------------------------------------------------------------- Wound Assessment Details Patient Name: Date of Service: JALYNN, WADDELL 04/18/2020 9:00 AM Medical Record Number:[id number] Patient Account Number: [id number] Date of Birth/Sex: Treating RN: 1951-09-04 (69 y.o. [name] Primary Care Kenora Spayd: [name] Other Clinician: Referring Deshun Sedivy: Treating Axton Cihlar/Extender:Robson, Leotis Shames, MARY Weeks in Treatment: 421 Wound Status Wound Number: 18 Primary Venous Leg Ulcer Etiology: Wound Location: Right, Lateral Lower Leg Wound Open Wounding Event: Gradually Appeared Status: Date Acquired: 04/18/2020 Comorbid Anemia, Hypertension, Peripheral Arterial Weeks Of Treatment: 0 History: Disease, Peripheral Venous Disease, Clustered Wound: No Raynauds, Scleroderma, Rheumatoid Arthritis, Osteoarthritis Wound Measurements Length: (cm) 1 % Redu Width: (cm) 0.5 % Redu Depth: (cm) 0.1 Epithe Area: (cm) 0.393 Tunne Volume: (cm) 0.039 Under Wound Description Classification: Full  Thickness Without Exposed Support Foul O Structures Slough Wound Distinct, outline attached Margin: Exudate Small Amount: Exudate Serous Type: Exudate amber Color: Wound Bed Granulation Amount: Large (67-100%) Granulation Quality: Pink Fascia Necrotic Amount: None Present (0%) Fat La Tendon Muscle Joint Bone E dor After Cleansing: No /Fibrino No Exposed Structure Exposed: No yer (Subcutaneous Tissue) Exposed: Yes Exposed: No Exposed: No Exposed: No xposed: No ction in Area: ction in Volume: lialization: None ling: No mining: No Treatment Notes Wound #18 (Right, Lateral Lower Leg) 1. Cleanse With Wound Cleanser Soap and water 2. Periwound Care Moisturizing lotion 4. Secondary Dressing Dry Gauze 6. Support Layer Applied Kerlix/Coban Notes applied gauze over wounds as directed by MD. No primary dressing Salicylic Acid applied related to not carried in clinlic. Electronic Signature(s) Signed: 04/18/2020 5:36:36 PM By: Kela Millin Entered By: Kela Millin on 04/18/2020 09:20:27 -------------------------------------------------------------------------------- Wound Assessment Details Patient Name: Date of Service: NEVAH, DALAL 04/18/2020 9:00 AM Medical Record Number:[id number] Patient Account Number: [id number] Date of Birth/Sex: Treating RN: 1951-08-17 (69 y.o. F) Dwiggins, Shannon Primary Care Deaken Jurgens: [name] Other Clinician: Referring Radames Mejorado: Treating Enora Trillo/Extender:Robson, Leotis Shames, MARY Weeks in Treatment: 421 Wound Status Wound Number: 5 Primary Venous Leg Ulcer Etiology: Wound Location: Right, Medial Lower Leg Wound Open Wounding Event: Gradually Appeared Status: Date Acquired: 01/13/2013 Comorbid Anemia, Hypertension, Peripheral Arterial Weeks Of Treatment: 377 History: Disease, Peripheral Venous Disease, Clustered Wound: Yes Raynauds, Scleroderma, Rheumatoid Arthritis, Osteoarthritis Wound Measurements Length:  (cm) 2.2 % Reduction Width: (cm) 2 % Reduction Depth: (cm) 0.2 Epitheliali Area: (cm) 3.[place]  Tunneling: Volume: (cm) 0.691 Underminin Wound Description Full Thickness With Exposed Support Foul Odor A Classification: Structures Slough/Fibr Wound Well defined, not attached Margin: Exudate Medium Amount: Exudate Type: Serous Exudate Color: amber Wound Bed Granulation Amount: Medium (34-66%) Granulation Quality: Pink Fascia Expo Necrotic Amount: Medium (34-66%) Fat Layer ( Necrotic Quality: Adherent Slough Tendon Expo Muscle Expo Joint Expos Bone Expose fter Cleansing: No ino Yes Exposed Structure sed: No Subcutaneous Tissue) Exposed: Yes sed: No sed: No ed: No d: No in Area: -144.4% in Volume: -390.1% zation: None No g: No Treatment Notes Wound #5 (Right, Medial Lower Leg) 1. Cleanse With Wound Cleanser Soap and water 2. Periwound Care Moisturizing lotion 3. Primary Dressing Applied Cellular Based Tissue Product Other primary dressing (specifiy in notes) 4. Secondary Dressing Dry Gauze 6. Support Layer Applied Kerlix/Coban Notes apligraft to medial lower leg with adaptic/steri-strips applied by MD. Electronic Signature(s) Signed: 04/18/2020 5:36:36 PM By: Kela Millin Entered By: Kela Millin on 04/18/2020 09:22:34 -------------------------------------------------------------------------------- Vitals Details Patient Name: Date of Service: [name]. 04/18/2020 9:00 AM Medical Record Number:[id number] Patient Account Number: [id number] Date of Birth/Sex: Treating RN: 11/05/1951 (69 y.o. [name] Primary Care Sheccid Lahmann: [name] Other Clinician: Referring Ayodele Sangalang: Treating Amia Rynders/Extender:Robson, Leotis Shames, MARY Weeks in Treatment: 421 Vital Signs Time Taken: 09:05 Temperature (F): 98.4 Height (in): 68 Pulse (bpm): 74 Weight (lbs): 132 Respiratory Rate (breaths/min): 19 Body Mass Index (BMI): 20.1 Blood  Pressure (mmHg): 155/52 Reference Range: 80 - 120 mg / dl Electronic Signature(s) Signed: 04/18/2020 5:36:36 PM By: Kela Millin Entered By: Kela Millin on 04/18/2020 09:12:29

## 2020-04-18 NOTE — Progress Notes (Signed)
[name] [name], [name] [name] ([id number]) Visit Report for 04/18/2020 Cellular or Tissue Based Product Details Patient Name: Date of Service: [name] [name], [name] [name] 04/18/2020 9:00 AM Medical Record Number:[id number] Patient Account Number: [id number] Date of Birth/Sex: Treating RN: 1951/05/21 (69 y.o. [name] Primary Care Provider: [name] Other Clinician: Referring Provider: Treating Provider/Extender:Renold Kozar, Leotis Shames, MARY Weeks in Treatment: [phone] Cellular or Tissue Based Wound #5 Right,Medial Lower Leg Product Type Applied to: Performed By: Physician Ricard Dillon., MD Cellular or Tissue Based Apligraf Product Type: Level of Consciousness (Pre- Awake and Alert procedure): Pre-procedure Yes - 09:42 Verification/Time Out Taken: Location: trunk / arms / legs Wound Size (sq cm): 4.4 Product Size (sq cm): 44 Waste Size (sq cm): 22 Waste Reason: wound size Amount of Product Applied (sq cm): 22 Lot #: [id number] Order #: 4 Expiration Date: 04/28/2020 Fenestrated: Yes Instrument: Blade Reconstituted: Yes Solution Type: normal saline Solution Amount: 10 mL Lot #: [id number] Solution Expiration Date: 11/29/2021 Secured: Yes Secured With: Steri-Strips Dressing Applied: Yes Primary Dressing: adaptic, gauze Procedural Pain: 0 Post Procedural Pain: 0 Response to Treatment: Procedure was tolerated well Level of Consciousness Awake and Alert (Post-procedure): Post Procedure Diagnosis Same as Pre-procedure Electronic Signature(s) Signed: 04/18/2020 5:34:04 PM By: Linton Ham MD Entered By: Linton Ham on 04/18/2020 09:51:27 -------------------------------------------------------------------------------- Debridement Details Patient Name: Date of Service: [name] [name] 04/18/2020 9:00 AM Medical Record Number:[id number] Patient Account Number: [id number] Date of Birth/Sex: Treating RN: [birth year] (69 y.o. [name] Primary Care Provider: [name] Other Clinician: Referring Provider: Treating Provider/Extender:Shania Bjelland, Leotis Shames, MARY Weeks in Treatment: [phone] Debridement Performed for Wound #5 Right,Medial Lower Leg Assessment: Performed By: Physician Ricard Dillon., MD Debridement Type: Debridement Severity of Tissue Pre Muscle involvement without necrosis Debridement: Level of Consciousness (Pre- Awake and Alert procedure): Pre-procedure Yes - 09:40 Verification/Time Out Taken: Start Time: 09:40 Total Area Debrided (L x W): 2.2 (cm) x 2 (cm) = 4.4 (cm) Tissue and other material Viable, Non-Viable, Slough, Subcutaneous, Slough debrided: Level: Skin/Subcutaneous Tissue Debridement Description: Excisional Instrument: Curette Bleeding: Minimum Hemostasis Achieved: Pressure End Time: 09:41 Procedural Pain: 0 Post Procedural Pain: 0 Response to Treatment: Procedure was tolerated well Level of Consciousness Awake and Alert (Post-procedure): Post Debridement Measurements of Total Wound Length: (cm) 2.2 Width: (cm) 2 Depth: (cm) 0.2 Volume: (cm) 0.691 Character of Wound/Ulcer Post Improved Debridement: Severity of Tissue Post Debridement: Fat layer exposed Post Procedure Diagnosis Same as Pre-procedure Electronic Signature(s) Signed: 04/18/2020 5:34:04 PM By: Linton Ham MD Signed: 04/18/2020 6:02:31 PM By: Levan Hurst RN, BSN Entered By: Linton Ham on 04/18/2020 09:51:16 -------------------------------------------------------------------------------- HPI Details Patient Name: Date of Service: [name] [name] 04/18/2020 9:00 AM Medical Record Number:[id number] Patient Account Number: [id number] Date of Birth/Sex: Treating RN: [birth year] (69 y.o. [name] Primary Care Provider: [name] Other Clinician: Referring Provider: Treating Provider/Extender:Kardell Virgil, Leotis Shames, MARY Weeks in Treatment: [phone] History of Present Illness HPI Description: this is a patient who  initially came to [place] for wounds on the medial malleoli bilaterally as well as her upper medial lower extremities bilaterally. These wounds eventually healed with assistance of Apligraf's bilaterally. While this was occurring she developed the current wound which opened into a fairly substantial wound on the right lower extremity. These are mostly secondary to venous stasis physiology however the patient also has underlying scleroderma, pulmonary hypertension. The wound has been making good progress lately with the Hydrofera Blue-based dressing. 03/17/2015; patient had a arterial evaluation a year ago. Her right ABI was 0.86 left  was 1.0. Toe brachial index was 0.41 on the right 0.45 on the left. Her bilateral common femoral artery waveforms were triphasic. Her white popliteal posterior tibial artery and anterior tibial artery waveforms were monophasic with good amplitude. Luteal artery waveforms were biphasic it was felt that her bilateral great toe pressures are of normal although adequate for tissue healing. 03/24/2015. The condition of this wound is not really improved that. He was covered with as fibrinous surface slough and eschar. This underwent an aggressive debridement with both a curette and scalpel. I still cannot really get down to what I can would consider to be a viable surface. There is no evidence of infection. Previous workup for ischemia roughly a year ago was negative nevertheless I think that continues to be a concern 04/07/15. The patient arrived for application of second Apligraf. Once again the surface of this wound is certainly less viable than I would like for an advanced treatment option. An aggressive debridement was done. She developed some arteriolar bleeding which required that along pressure and silver alginate. 04/21/15: change in the condition of this wound. Once again it is covered in a gelatinous surface slough. After debridement today surface of the wound looks  somewhat better but now a heeling surface 05/05/15 Apligraf #4 applied.Still a lot of slough on this wound. 05/19/15 Apligraf #5 applied. Still a lot of slough on this wound I did not aggressively remove this 06/02/15; continued copious amounts of surface slough. This debridement fairly easily. 06/08/2015 -- the last time she had a venous duplex study done was over 3 years ago and the surgery was prior to that. I have recommended that she sees Dr. early for a another opinion regarding a repeat venous duplex and possibly more endovenous ablation of vein stripping of micro-phlebotomies. 06/16/15; wound has a gelatinous surface eschar that the debridement fairly easily to a point. I don't disagree with the venous workup and perhaps even arterial re-evaluation. She is on prednisone 5 mg and continue his medications for her pulmonary artery hypertension I am not sure if the latter have any wound care healing issues I would need to investigate this. 06/23/15 continues with a gelatinous surface eschar with of fibrinous underlying. What I can see of this wound does not look unhealthy however I just can't get this material which I think is 2 different layers off. Empiric culture done 06/30/15; unfortunately not a lot of change in this wound. A gelatinous surface eschar is easily removed however it has a tight fibrinous surface underneath the. 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 The wound comes back and basically and unchanged state the. She has a gelatinous surface that is more easily removed however there is a tightly fibrinous surface underneath the. There is no evidence of infection. She has a vascular follow-up next month. I would have to inject her in order to do a more aggressive debridement of this area 07/21/15 the wound is roughly in the same state albeit the debridement is done with greater ease. There is less of the fibrinous eschar underneath the. There is no evidence of  infection. She has follow-up with vascular surgery next week. No evidence of surrounding infection. Her original distal wounds healed while this one formed. 07/28/15; wound is easier to debride. No wound erythema. She is seeing Dr. [name] next week. 08/18/15 Has been to [place] for repeat ablation. Have been using medihoney pad with some improvement 08/25/15; absolutely no change in the condition  of this wound in either its overall size or surface condition in many months now. At one point I had this down to [place] healthier surface I think with [facility] however this did not actually progress towards closure. Do not believe that the wound has actually deteriorated in terms of volume at all. We have been using a medihoney pad which allows easier debridement of the gelatinous eschar but again no overall actual improvement. the patient is going towards an ablation with pain and vascular which I think is scheduled for next week. The only other investigation that I could first see would be a biopsy. She does have underlying scleroderma 09/02/15 eschar is much easier to debridement however the base of this does not look particularly vibrant. We changed to Iodoflex. The patient had her ablation earlier this week 09/09/15 again the debridement over the base of this wound is easier and the base of the wound looks considerably better. We will continue the Iodoflex. Dr. Tawni Millers has expressed his satisfaction with the result of her ablation 09/15/15 once again the wound is relatively free of surface eschar. No debridement was done today. It has a pale- looking base to it. although this is not as deep as it once was it seems to be expanding especially inferiorly. She has had recent venous ablation but this is no closer to healing.I've gone ahead and done a punch biopsy this from the inferior part of the wound close to normal skin 09/22/15: the wound is relatively free of surface eschar. There is some  surrounding eschar. I'm not exactly sure at what level the surface is that I am seeing. 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 No evidence of atypical infection, malignancy. 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 No debridement was done 09/29/15; the wound surface is relatively free of surface eschar. There is eschar surrounding the walls of the wound. Aside from the improvement in the amount of surface slough. This wound has not progressed any towards closure. There is not even a surface that looks like there at this is ready. There is no evidence of any infection nor maligancy based on biopsy I did on 9/15. I continued with the Iodoflex however I am looking towards some alternative to try to promote some closure or filling in of this surface. Consider triple layer Oasis. Collagen did not result in adequate control of the surface slough 10/13/15; the patient was in hospital last week with severe anemia. The wound looks somewhat better after debridement although there is widening medially. There is no evidence of infection. 10/20/15; patient's wound on the right lateral lower leg is essentially unchanged. This underwent a light surface debridement and in general the debridement is easier and the surface looks improved. I noted in doing this on the side of the wound what appears to be a piece of calcium deposition. The patient noted that she had previous things on the tips of her fingers. 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 She follows with rheumatology and I have asked them to talk to her about this. In view of that S the nonhealing ulcer may have something to do with calcinosis and also unrelieved Raynaud's disease in this area. I should note that her original wounds on the right and left medial malleolus and the inner aspects of both legs just below the  knees did however heal over 10/30/15; the patient's wound on the  right lateral lower leg is essentially unchanged. I was able to remove some calcified material from the medial wound edge. 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 Otherwise the wound appears essentially unchanged there is less adherent surface eschar. Some of the calcified material was sent to pathology for analysis 11/17/15. The patient's wound on the right lateral leg is essentially unchanged. Wider Medially. 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 There is some surface slough but I did not debridement this. No evidence of surrounding infection. 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 This will make healing difficult 12/15/15; the wound has less depth surface has a fibrinous slough and calcifications in the wound edge no evidence of infection 12/22/15; the wound definitely has less Fibrinous slough on the base. Calcifications around the wound edges are still evident. Although the wound bed looks healthier it is still pale in appearance. Previous biopsy did not show malignancy 01/04/15; surgical debridement of nonviable slough and subcutaneous tissue the wound cleans up quite nicely but appears to be expanding outward calcifications around the wound edges are still there. 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 She is to see her rheumatologist I'll see if he has any opinions on this. My punch biopsy done in September did not show calcifications although these are clearly evident. 01/19/16 light selective debridement of nonviable surface slough. There  is epithelialization medially. This gives me reason for cautious optimism. She has been to see her rheumatologist, there is nothing that can be done for this type of soft tissue calcification associated with scleroderma 02/02/16 no debridement although there is a light surface slough. She has 2 peninsulas of skin 1 inferiorly and one medially. We continue to make a slight and slow but definite progressive here 02/16/16; light surface debridement with more attention to the circumference of the wound bed where the fibrinous eschar is more prevalent. No calcifications detected. She seems to have done nicely with the [facility] with some epithelialization and some improvement in the overall wound volume. 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 Continued attention to the circumference of the wound where the fibrinous eschar in calcinosis or prevalent. No calcifications were detected. She has continued improvement with Hydrofera Blue. The wound is no longer as deep 03/15/16 surgical debridement done to remove surface escha Especially around the circumference of the wound where there is nonviable subcutaneous tissue. In spite of this there is considerable improvement in the overall dimensions and depth of the wound. Islands of epithelialization are seen especially medially inferiorly and superiorly to a lesser extent. She is using Hydrofera Blue at home 03/29/16; surgical debridement done to remove surface eschar and nonviable subcutaneous tissue. This cleans up quite nicely mention slightly larger in terms of length and width however depth is less 04/12/16; continued gradual improvement in terms of depth and the condition of the wound base. Debridement is done. Continuing long standing Hydrofera Blue at home with Kerlix Coban wraps 04/26/16; continued gradual improvement in  terms of depth and management as well as condition of the wound base. Surgical debridement done  she continues with Hydrofera Blue. 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 She initially came to this clinic venous insufficiency ulcers which have long since healed 05/17/16 continued improvement in terms of the depth and measurements of this wound although she has a tightly adherent fibrinous slough each time. We've been continued with long standing Hydrofera Blue which seems to done as well for this wound is many advanced treatment options. 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 She also has chronic venous insufficiency. She has an irritation on her lateral right ankle secondary to our wraps 05/10/16; wound appears to be smaller especially on the medial aspect and especially in the width. Wound was debridement surface looks better. She is also been in the hospital apparently with anemia again she tells me she had an endoscopy. Since she got home after 3 days which I believe was sometime last week she has had an irritated painful area on the right lateral ankle surrounding the lateral malleolus 05/31/16; much more adherent surface slough today then recently although I don't think the dimensions of changed that much. A more aggressive debridement is required. The irritated area over her medial malleolus is more pruritic and painful and I don't think represents cellulitis 06/14/16; no major change in her wound dimensions however there is more tightly adherent surface slough which is disappointing. As well as she appears to have a new small area medially. Furthermore an irritated uncomfortable area on the lateral aspect of her right foot just below the lateral malleolus. 06/21/16; I'm seeing the patient back in follow-up for the new areas under her major wound on the right anterior leg. She has been using Hydrofera Blue to this  area probably for several months now and although the dressings seem to be helping for quite a period of time I think things have stagnated lately. She comes in today with a relatively tight adherent surface slough and really no changes in the wound shape or dimension. The 2 small areas she had inferiorly are tiny but still open they seem improved this well. There is no uncontrolled edema and I don't think there is any evidence of cellulitis. 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 Patient has had vascular evaluations both venous and arterial. I have biopsied this area. There is no obvious infection. The worrisome thing today is that she seems to be developing areas of erythema and epithelial damage on the medial aspect of the right foot. Also to a lesser degree inferior to the actual wound itself. Again I see no obvious changes to suggest cellulitis however as this is the only treatable option I will probably give her antibiotics. 07/13/16 no major change in the lady's large anterior right leg wound. Still covered with a very tightly adherent surface slough which is difficult to debridement. There is less erythema around this, culture last week grew pseudomonas I gave her ciprofloxacin. The area on her lateral right malleolus looks better- 08/02/16 the patient's wounds continued to decline. Her original large anterior right leg wound looks deeper. Still adherent surface slough that is difficult to debridement. She has a small area just below this and a punched-out wound just below her lateral malleolus. In the meantime she is been in hospital with apparently an upper GI bleed on Plavix and aspirin. She is now just on Plavix she  received 3 units of packed cells 08/23/16; since I last saw this 3 weeks ago, the open large area on her right leg looks about the same syrup. She has a small  satellite lesion just underneath this. The area on her medial right ankle is now a deep necrotic wound. I attempted to debridement this however there is just too much pain. 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 She follows with vascular surgery and is scheduled for an angiogram in early September 09/06/16; the patient is going for an arteriogram tomorrow. Her original large wound on the right calf is about the same the satellite lesion underneath it is about the same however the area on her medial ankle is now deeper with exposed tendon. I am no longer attempting to debride these wounds 09/20/16; the patient has undergone a right femoral endarterectomy and Dacron patch angioplasty. This seems to have helped the flow in her right leg. 10/04/16; Arrived today for aggressive debridement of the wounds on her right calf the original wound the one beneath it and a difficult area over her right lateral leg just above the lateral malleolus. 10/25/16; her 3 open wounds are about the same in terms of dimensions however the surface appears a lot healthier post debridement. Using Iodoflex 10/18/16 we have been using Iodoflex to her wounds which she tolerated with some difficulty. 10/11/16; has been using Santyl for a period of time with some improvement although again very adherent surface slough would prevent any attempted healing this. She has a original wound on the left calf, the satellite underneath that and the most recent wound on the right medial ankle. She has completed revascularization by Dr. early and has had venous ablation earlier. Want to go back to Iodoflex to see if week and get a healthier surface to this wound bed failing this I think she'll need to be taken to the OR and I am prepared to call Dr. Marla Roe to discuss this. She is obviously not a good candidate for general anesthesia however.; 11/08/16; I put her on  Iodoflex last time to see if I can get the wound bed any healthier and unfortunately today still had tremendous surface slough. 11/15/16; 4 weeks' worth of Iodoflex with not much improvement. Debridement on the major wounds on her left anterior leg is easier however this does not maintain from week to week. The punched-out area on her medial right ankle 11/29/16; I attempted to change the patient last visit to [facility] however she states this burned and was very uncomfortable therefore we gave her permission to go back to the eye out of complex which she already had at home. Also she noted a lot of pain and swelling on the lateral aspect of her leg before she traveled to  [facility] for the holidays, I called her in doxycycline over the phone. This seems to have helped 12/06/16; Wounds unchanged by in large. Using Iodoflex 12/13/16; her wounds today actually looks somewhat better. The area on the right lateral lower leg has reasonably healthy-looking granulation and perhaps as actually filled and a bit. Debridement of the 2 wounds on the medial calf is easier and post debridement appears to have a healthier base. We have referred her to Dr. Migdalia Dk for consideration of operative debridement 12/20/16; we have a quick note from Dr. Merri Ray who feels that the patient needs to be referred to an academic center/plastic surgeon. This is  due to the complexity of the patient's medical issues as it applies to anything in the OR. We have been using Iodoflex 12/27/16; in general the wounds on the right leg are better in terms of the difficult to remove surface slough. She has been using Iodoflex. She is approved for Apligraf which I anticipated ordering in the next week or 2 when we get a better-looking surface 01/04/16 the deep wounds on the right leg generally look better. Both of them are debrided further surface slough. The area on the lateral right leg was not debrided. She  is approved for Apligraf I think I'll probably order this either next week or the week after depending on the surface of the wounds superiorly. We have been using Iodoflex which will continue until then 01/11/16; the deep wounds on the right leg again have a surface slough that requires debridement. I've not been able to get the wound bed on either one of these wounds down to what would be acceptable for an advanced skin stab- like Apligraf. The area on the medial leg has been improving. We have been using hot Iodoflex to all wounds which seems to do the best at at least limiting the nonviable surface 01/24/17; we have continued Iodoflex and all her wound areas. Her debridement Gen. he is easier and the surface underneath this looks viable. Nevertheless these are large area wounds with exposed muscle at least on the anterior parts. We have ordered Apligraf's for 2 weeks from now. The patient will be away next week 02/07/17; the patient was close to have first Apligraf today however we did not order one. I therefore replaced her Iodoflex. She essentially has 3 large punched-out areas on her right anterior leg and right medial ankle. 02/11/17; Apligraf #1 02/25/17; Apligraf #2. In general some improvement in the right medial ankle and right anterior leg wounds. The larger wound medially perhaps some better 03/11/17; Apligraf #3. In general continued improvement in the right medial ankle and the right anterior leg wounds. 03/25/17 Apligraf #4. In general continued improvement especially on the right medial ankle and the lower 04/08/17; Apligraf #5 in general continued improvement in all wound sites. 04/22/17; post Apligraf #5 her wound beds continued to look a lot better all of them up to the surface of the surrounding skin. Had a caramel-colored slough that I did not debrided in case there is residual Apligraf effect. The wounds are as good as I have seen these looking quite some time. 04/29/17; we applied  [place] and last week after completing her Apligraf. Wounds look as though they've contracted somewhat although they have a nonviable surface which was problematic in the past. Apparently she has been approved for further Apligraf's. We applied Iodosorb today after debridement. 05/06/17; we're fortunate enough today to be able to apply additional Apligraf approved by her insurance. In general all of her wounds look better Apligraf #6 05/24/17; Apligraf #7 continued improvement in all wounds 3 06/10/17 Apligraf #8. Continued improvement in the surface of all wounds. Not much of an improvement in dimensions as I might a follow 06/24/17 Apligraf #9 continued general improvement although not as much change in the wound areas I might of like. She has a new open area on the right anterior lateral ankle very small and superficial. She also has a necrotic wound on the tip of her right index finger probably secondary to severe uncontrolled Raynaud's phenomenon. She is already on sildenafil and already seen her rheumatologist who gave her Keflex.  07/08/17; Apligraf #10. Generalized improvement although she has a small additional wound just medial to the major wound area. 07/22/17; after discussion we decided not to reorder any further Apligraf's although there is been considerable improvement with these it hasn't been recently. The major wound anteriorly looks better. Smaller wounds beneath this and the more recent one and laterally look about the same. The area on the right lateral lower leg looks about the same 07/29/17 this is a patient who is exceedingly complex. She has advanced scleroderma, crest syndrome including calcinosis, PAD status post revascularization, chronic venous insufficiency status post ablations. She initially presented to this clinic with wounds on her bilateral lower legs however these closed. More recently we have been dealing with a large open area superiorly on the right anterior  leg, a smaller wound underneath this and then another one on the right medial lower extremity. These improve significantly with 10 Apligraf applications. Over the last 2-3 weeks we are making good progress with Hydrofera Blue and these seem to be making progress towards closure 08/19/17; wounds continued to make good improvement with Hydrofera Blue and episodic aggressive debridements 08/26/17; still using Hydrofera Blue. Good improvements 09/09/17; using Hydrofera Blue continued improvement. Area on the lateral part of her right leg has only a small remaining open area. The small inferior satellite region is for all intents and purposes closed. Her major wound also is come in in terms of depth and has advancing epithelialization. 09/16/17; using Hydrofera Blue with continued improvement. The smaller satellite wound. We've closed out today along with a new were satellite wound medially. The area on her medial ankle is still open and her major wound is still open but making improvement. All using Hydrofera Blue. Currently 09/30/17; using Hydrofera Blue. Still a small open area on the lateral right ankle area and her original major wound seems to be making gradual and steady improvement. 10/14/17; still using Hydrofera Blue. Still too small open areas on the right lateral ankle. Her original major wound is horizontal and linear. The most problematic area paradoxically seems to be the area to the medial area wears I thought it would be the lateral. 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 10/28/17; still using Hydrofera Blue. Right lateral ankle has a very small open area superiorly on the most lateral part of the wound. Her original open wound has 2 open areas now separated by normal skin and we've redefined this. 11/11/17; still using Hydrofera Blue area and right lateral ankle continues to have a small open area on mostly the lateral part of the  wound. Her original wound has 2 small open areas now separated by a considerable amount of normal skin 11/28/17; the patient called in slightly before Thanksgiving to report pain and erythema above the wound on the right leg. In the past this is responded well to treatment for cellulitis and I gave her over the phone doxycycline. She stated this resulted in fairly abrupt improvement. We have been using Hydrofera Blue for a prolonged period of time to the larger wound anteriorly into the remaining wound on her right right lateral ankle. The latter is just about closed with only a small linear area and the bottom of the [place]. 12/02/17; use endoform and left the dressing on since last visit. There is no tenderness and no evidence of infection. 12/16/17; patient has been using Endoform but not making much progress. The 2 punched out open areas anteriorly which were  the reminiscence of her major wound appear deeper. The area on the lateral aspect of her right calf also appears deeper. Also she has a puzzling tender swelling above her wound on the right leg. This seems larger than last time. Just above her wounds there appears to be some fluctuance in this area it is not erythematous and there is no crepitus 12/30/17; patient has been using Endoform up until last week we used Hydrofera Blue. Ultrasound of the swelling above her 2 major wounds last time was negative for a fluid collection. I gave her cefaclor for the erythema and tenderness in this area which seems better. Unfortunately both punched out areas anteriorly and the area on her right medial lower leg appear deeper. In fact the lateral of the wounds anteriorly actually looks as though it has exposed tendon and/or muscle sheath. She is not systemically unwell. She is complaining of vaginitis type symptoms presumably Candida from her antibiotics. 01/06/18; we're using santyl. she has 2 punched out areas anteriorly which were initially part of a  large wound. Unfortunately medially this is now open to tendon/muscle. All the wounds have the same adherent very difficult to debride surface. 01/20/18; 2 week follow-up using Santyl. She has the 2 punched out areas anteriorly which were initially part of her large surface wound there. Medially this still has exposed muscle. All of these have the same tightly adherent necrotic surface which requires debridement. PuraPly was not accepted by the patient's insurance however her insurance I think it changed therefore we are going to run Apligraf to gain 02/03/18; the patient has been using Santyl. The wound on the right lateral ankle looks improved and the 2 areas anteriorly on the right leg looks about the same. The medial one has exposed muscle. The lateral 1 requiresdebridement. We use PuraPly today for the first week 02/10/18; PuraPly #2. The patient has 3 wounds. The area on the right lateral ankle, 2 areas anteriorly that were part of her original large wound in this area the medial one has exposed muscle. All of the wounds were lightly debrided with a number 3 curet. PuraPly #2 applied the lateral wound on the calf and the right lateral ankle look better. 02/17/18; PuraPly #3. Patient has 3 wounds. The area on the right lateral ankle in 2 areas internally that were part of her original large wound. The lateral area has exposed muscle. She arrived with some complaints of pain around the right ankle. 02/24/18; PuraPly #4; not much change in any of the 3 wound areas. Right lateral ankle, right lateral calf. Both of these required debridement with a #3 curet. She tolerates this marginally. The area on the medial leg still has exposed muscle. Not much change in dimensions 03/03/18 PuraPly #5. The area on the medial ankle actually looks better however the 2 separate areas that were original parts of the larger right anterior leg wound look as though they're attempting to coalesce. 03/10/18; PuraPly #6. The  area on the medial ankle actually continues to look and measures smaller however the 2 separate areas that were part of the original large wound on the right anterior leg have now coalesced. There hasn't been much improvement here. The lateral area actually has underlying exposed muscle 03/17/18-she is here in follow-up evaluation for ulcerations to the right lower extremity. She is voicing no complaints or concerns. She tolerated debridement. Puraply#7 placed 03/24/18; difficult right lower extremity ulcerations. PuraPly #8 place. She is been approved for [employer]. She did very well  with Apligraf today however she is apparently reached her "lifetime max" 03/31/18; marginal improvement with PuraPly although her wounds looked as good as they have in several weeks today. Used TheraSkins #1 04/14/18 TheraSkin #2 today 04/28/18 TheraSkins #3. Wound slightly improved 05/12/18; TheraSkin skin #4. Wound response has been variable. 05/27/18 TheraSkin #5. Generally improvement in all wound areas. I've also put her in 3 layer compression to help with the severe venous hypertension 06/09/18; patient is done quite well with the TheraSkins unfortunately we have no further applications. I also put her in 3 layer compression last week and that really seems to of helped. 06/16/18; we have been using silver collagen. Wounds are smaller. Still be open area to the muscle layer of her calf however even that is contracted somewhat. She tells me that at night sometimes she has pain on the right lateral calf at the site of her lower wound. Notable that I put her into 3 layer compression about 3 weeks ago. She states that she dangles her leg over the bed that makes it feel better but she does not describe claudication during the day She is going to call her secondary insurance to see if they will continue to cover advanced treatment products I have reviewed her arterial studies from 01/22/17; this showed an ABI in the right of  1 and on the left noncompressible. TBI on the right at 0.30 on the left at 0.34. It is therefore possible she has significant PAD with medial calcification falsely elevating her ABI into the normal range. I'll need to be careful about asking her about this next week it's possible the 3 layer compression is too much 06/23/18; was able to reapply TheraSkin 1 today. Edema control is good and she is not complaining of pain no claudication 07/07/18;no major change. New wound which was apparently a taper removal injury today in our clinic between her 2 wounds on the right calf TheraSkins #2 07/14/18; I think there is some improvement in the right lateral ankle and the medial part of her wound. There is still exposed muscle medially. 07/28/18; two-week follow-up. TheraSkins#3. Unfortunately no major change. She is not a candidate I don't think for skin grafting due to severe venous hypertension associated with her scleroderma and pulmonary hypertension 08/11/18 Patient is here today for her [employer] application #94 (#5 of the second set). She seems to be doing well and in the base of the wound appears to show some progress at this point. This is the last approved [employer]askin of the second set. 08/25/18; she has completed TheraSkin. There has been some improvement on the right lateral calf wound as well as the anterior leg wounds. The open area to muscle medially on the anterior leg wound is smaller. I'm going to transition her back to [facility] under Kerlix Coban change every second day. She reports that she had some calcification removed from her right upper arm. We have had previous problems with calcifications in her wounds on her legs but that has not happened recently 09/08/18;using Hydrofera Blue on both her wound areas. Wounds seem to of contracted nicely. She uses Kerlix Coban wrap and changes at home herself 09/22/2018; using Hydrofera Blue on both her wound areas. Dimensions seem to have come  down somewhat. There is certainly less depth in the medial part of the mid tibia wound and I do not think there is any exposed muscle at this point. 10/06/2018; 2-week follow-up. Using [facility] on her wound areas. Dimensions have come  down nicely both on the right lateral ankle area in the right mid tibial area. She has no new complaints 10/20/2018; 2-week follow-up. She is using Hydrofera Blue. Not much change from the last time she was here. The area on the lateral ankle has less depth although it has raised edges on one side. I attempted to remove as much of the raised edge as I could without creating more additional wounds. The area on the right anterior mid tibia area looks the same. 11/03/2018; 2-week follow-up she is using Hydrofera Blue. On the right anterior leg she now has 2 wounds separated by a large area of normal skin. The area on the medial part still has I think exposed muscle although this area itself is a lot smaller. The area laterally has some depth. Both areas with necrotic debris. The area on her right lateral ankle has come in nicely 11/17/2018; patient continues to use Hydrofera Blue. We have been increasing separation of the 2 wounds anteriorly which were at one point joint the area on the right lateral calf continues to have I think some improvement in depth. 12/08/2018; patient continues to use Hydrofera Blue. There is some improvement in the area on the right lateral calf. The 2 areas that were initially part of the original large wound in the mid right tibia are probably about the same. In fact the medial area is probably somewhat larger. We will run puraply through the patient's insurance 12/22/2018; she has been using Hydrofera Blue. We have small wounds on the right lateral calf and 2 small areas that were initially part of a large wound in the right mid tibia. We applied pure apply #1 today. 12/29/2018; we applied puraply #2. Her wounds look somewhat better  especially on the right lateral calf and the lateral part of her original wound in the mid tibial area. 01/05/2019; perhaps slightly improved in terms of wound bed condition but certainly not as much improvement as I might of liked. Puraply #3 1/13: we did not have a correct sized puraply to apply. wounds more pinched out looking, I increased her compression to 3 layer last week to help with significant multilevel venous hypertension. Since then I've reviewed her arterial status. She has a right femoral endarterectomy and a distal left SFA stent. She was being followed by Dr. [name] for a period however she does not appear to have seen him in 3 years. I will set up an appointment. 1/20. The patient has an additional wound on the right lateral calf between the distal wound and proximal wounds. We did not have Puraply last week. Still does not have a follow-up with Dr. early 1/27: Follow-up with Dr. early has been arranged apparently with follow-up noninvasive studies. Wounds are measuring roughly the same although they certainly look smaller 2/3; the patient had non invasive studies. Her ABIs on the right were 0.83 and on the left 1.02 however there was no great toe pressure bilaterally. Also worrisome monophasic waveforms at the PTA and dorsalis pedis. We are still using Puraply. We have had some improvement in all of the wounds especially the lateral part of the mid tibial area. 2/10; sees Dr. early of vein and vascular re-arterial studies next week. Puraply reapplied today. 2/17; Dr. early of vein and vascular his appointment is tomorrow. Puraply reapplied after debridement of all wounds 2/24; the patient saw Dr. early I reviewed his note. sHe noted the previous right femoral endarterectomy with a Dacron patch. He also noted the normal  ABI and the monophasic waveforms suggesting tibial disease. Overall he did not feel that she had any evidence of arterial insufficiency that would impair her wound  healing. He did note her venous disease as well. He suggested PRN follow-up. 3/2; I had the last puraply applied today. The original wounds over the mid tibia area are improved where is the area on the right lower leg is not 3/9; wounds are smaller especially in the right mid tibia perhaps slightly in the right lateral calf. We finished with puraply and went to endoform today 3/23; the patient arrives after 2 weeks. She has been using endoform. I think all of her wounds look slightly better which includes the area on the right lateral calf just above the right lateral malleolus and the 2 in the right mid tibia which were initially part of the same wound. 4/27; TELEHEALTH visit; the patient was seen for telehealth visit today with her consent in the middle of the worldwide epidemic. Since she was last here she called in for antibiotics with pain and tenderness around the area on the right medial ankle. I gave her empiric doxycycline. She states this feels better. She is using endoform on both of these areas 5/11 TELEHEALTH; the patient was seen for telehealth visit today. She was accompanied at home by her husband. She has severe pulmonary hypertension accompanied scleroderma and in the face of the Covid epidemic cannot be safely brought into our clinic. We have been using endoform on her wound areas. There are essentially 3 wound areas now in the left mid tibia now 2 open areas that it one point were connected and one on the right lateral ankle just above the malleolus. 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 She cannot come to clinic. The wounds were reviewed today via telehealth. She has severe chronic venous hypertension which I think is centrally mediated. She has wounds on her right anterior tibia and right  lateral ankle area. These are chronic. She has been using endoform. 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 We have been following her from telehealth. She has severe chronic venous hypertension which may be mostly centrally mediated secondary to right heart heart failure. She has wounds on her right anterior tibia and right lateral ankle these are chronic we have been using endoform 6/22; TELEHEALTH; this patient was seen today via telehealth. 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 Since the last time we had contact with this patient she developed some pain and erythema around the wound on her right lateral malleolus/ankle and we put in antibiotics for her. This is resulted in good improvement with resolution of the erythema and tenderness. I changed her to silver alginate last time. We had been using endoform for an extended period of time 7/13; TELEHEALTH; this patient was seen today via telehealth. 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 She would continue to be at a prohibitive risk to be brought into our clinic unless this was absolutely necessary. These visits have been done with her approval as well as her husband. We have been using silver alginate to  the areas on the right mid tibia and right lateral lower leg. 7/27 TELEHEALTH; patient was seen along with her husband today via telehealth. 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 We changed her to sample last visit. She has 2 areas a chronic wound on her right mid tibia and one just above her ankle. These were not the original wounds when she came into this clinic but she developed  them during treatment 8/17; she comes in for her first face-to-face visit in a long period. She has a remaining area just medial to the right tibia which is the last open part of her large wound across this area. She also has an area on the right lateral lower leg. We prescribed Santyl last telehealth visit but they were concerned that this was making a deeper so they put silver alginate on it last week. Her husband changes the dressings. 8/31; using Santyl to the 2 wound areas some improvement in wound surfaces. Husband has surgery in 2 weeks we will put her out 3 weeks. Any of the advanced treatment options that I can think of that would be eligible for this wound would also cause her to have to come in weekly. The risk that the patient is just too high 9/21. Using Santyl to the 2 wound areas. Both of these are somewhat better although the medial mid tibia area still has exposed muscle. Lateral ankle requiring debridement. Using Santyl 10/12; using Santyl to the 2 wound areas. One on the right lateral ankle and the other in the medial calf which still has exposed muscle. Both areas have come down in size and have better looking surfaces. She has made nice progress with santyl 11/2; we have been using Santyl to the 2 wound areas. Right lateral ankle and the other in the mid tibia area the medial part of this still has exposed muscle. 11/23/2019 on evaluation today patient appears to be doing about the same really with regard to her wounds. She is actually not very pleased with how things seem to be progressing at this point she tells me that she really has not noted much improvement unfortunately. With that being said there is no signs of active infection at this time. There is some slough buildup noted at this time which again along with some dry skin around the edges of the wound I think would benefit her to try to debride some of this away. Fortunately her pain is doing fairly well. She still  has exposed muscle in the right medial/tibial area. 12/14; TELEHEALTH; she was changed to [facility] to the right calf wound and right lateral ankle wound when she was here last time. Unfortunately since then she had a fall with a pelvic fracture and a fracture of her wrist. She was apparently hospitalized for 5 days. I have not looked at her discharge summary. She apparently came out of the hospital with a blister on her right heel. She was seen today via telehealth by myself and our case manager. The patient and her husband were present. She has been using Hydrofera Blue at our direction from the last time she was in the clinic. There is been no major improvement in fact the areas appear deeper and with a less viable surface 01/04/2020; TELEHEALTH; the patient was seen today in accompaniment of her husband and our nurse. She has 3 open areas 1 on the right medial  mid tibia, one on the right lateral ankle and a large eschared pressure area on the right heel. We have been using Iodoflex to the 2 original wounds. 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 It is simply too perilous for her to be seen in any other way 1/26; TELEHEALTH; the patient was seen today in accompaniment of her husband and our RN one of our nurses. She still has the 3 open areas 1 on the right medial mid tibia which is the remanent of a more extensive wound in this area, one on the right lateral ankle and a large eschared pressure area on the right heel. We have been using Iodoflex to the 2 original wounds and a bed at 9 application to the eschared area on the heel 2/15; the first time we have seen this patient and then several months out of concern for the pandemic. She had a large horizontal wound in the mid tibia. Only the medial aspect of this is still open. Area on the lateral ankle is just about closed. She had a new pressure ulcer on the right heel which I have removed some of the  eschar. We have been using Iodoflex which I will continue. The area in the mid tibia has a round circle in the middle of exposed muscle. I think we would have to use an advanced treatment product to stimulate granulation over this area. We will run this through her insurance. She is not eligible for plastic surgery for many reasons 3/1; TELEHEALTH. The patient was seen today by telehealth. 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 We have been using Iodoflex to the wound areas which include the right anterior mid tibia, right lateral ankle and the right heel. All of these were reviewed 3/8; the patient's wound just above the right ankle is closed. She still has a contracting black eschar on the heel where she had a pressure ulcer. The medial part of her original wound on the mid tibia has exposed muscle. We have made really made no progress in this area although we have managed to get a lot of the original wound in this area to close. We used Apligraf #1 today 3/22; the patient's ankle wound remains closed. She still has a contracting black eschar on the heel although it seems to have less surface area. It still not clear whether there is any depth here. We used Apligraf #2 today in an attempt to get granulation over the exposed muscle and what is left of her mid tibia area. 4/5; everything is closed except the medial aspect of the mid tibia wound, as well as the pressure injury on the right heel. She has been using Betadine to the right heel which has been gradually contracting. We used Apligraf #3 today. 4/19; Apligraf #4. Still a pressure area on the right heel she has been using Betadine superficial excoriated areas she has been applying salicylic acid to on the anterior leg and the thick horn on the leg just above her wound area Electronic Signature(s) Signed: 04/18/2020 5:34:04 PM By: Linton Ham MD Entered By: Linton Ham on 04/18/2020 09:52:15 -------------------------------------------------------------------------------- Physical Exam Details Patient Name: Date of Service: [name] [name] 04/18/2020 9:00 AM Medical Record Number:[id number] Patient Account Number: [id number] Date of Birth/Sex: Treating RN: 17-Oct-1951 (69 y.o. [name] Primary Care Provider: [name] Other Clinician: Referring Provider: Treating Provider/Extender:Qasim Diveley, Leotis Shames, [facility]  Weeks in Treatment: 50 Constitutional Patient is hypertensive.. Pulse regular and within target range for patient.Marland Kitchen Respirations regular, non-labored and within target range.. Temperature is normal and within the target range for the patient.Marland Kitchen Appears in no distress. Notes Wound exam Midportion of the tibia medially. Last remanent of a fairly substantial wound however there is still exposed muscle in this area has not contracted at all. I was hoping to get granulation over this area. The wound area does not look bad however there is undermining superiorly and laterally. Electronic Signature(s) Signed: 04/18/2020 5:34:04 PM By: Linton Ham MD Entered By: Linton Ham on 04/18/2020 09:53:08 -------------------------------------------------------------------------------- Physician Orders Details Patient Name: Date of Service: [name] [name] 04/18/2020 9:00 AM Medical Record Number:[id number] Patient Account Number: [id number] Date of Birth/Sex: Treating RN: 04/26/1951 (69 y.o. 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 Dressing Change Frequency Wound #16 Right Calcaneus Change Dressing every other day. Wound #17 Right,Anterior Lower Leg Change Dressing every other day. Wound #18 Right,Lateral Lower Leg Change Dressing every other day. Wound #5 Right,Medial Lower Leg Change Dressing every other day. - ***DO NOT REMOVE STERI STRIPS - CHANGE OUTSIDE BANDAGE ONLY*** Skin Barriers/Peri-Wound Care Moisturizing lotion - both legs Wound Cleansing May shower and wash wound with soap and water. - on days that dressing is changed Primary Wound Dressing Wound #16 Right Calcaneus Other: - paint with Betadine Wound #17 Right,Anterior Lower Leg Other: - Salicylic Acid (per Dermatology) Wound #18 Right,Lateral Lower Leg Other: - Salicylic Acid (per Dermatology) Wound #5 Right,Medial Lower Leg Skin Substitute Application - Apligraf #4 Secondary Dressing Wound #17 Right,Anterior Lower Leg Dry Gauze Dry Gauze Wound #18 Right,Lateral Lower Leg Dry Gauze Wound #5 Right,Medial Lower Leg Adaptic Dressing - secure with steri strips Dry Gauze Wound #16 Right Calcaneus Dry Gauze Heel Cup Edema Control Kerlix and Coban - Right Lower Extremity Avoid standing for long periods of time Elevate legs to the level of the heart or above for 30 minutes daily and/or when sitting, a frequency of: - throughout the day Off-Loading Turn and reposition every 2 hours Other: - float heels off of bed/chair with pillow under calves Electronic Signature(s) Signed: 04/18/2020 5:34:04 PM By: Linton Ham MD Signed: 04/18/2020 6:02:31 PM By: Levan Hurst RN, BSN Entered By: Levan Hurst on 04/18/2020  09:52:33 -------------------------------------------------------------------------------- Problem List Details Patient Name: Date of Service: [name] [name]. 04/18/2020 9:00 AM Medical Record Number:[id number] Patient Account Number: [id number] Date of Birth/Sex: Treating RN: December 20, 1951 (69 y.o. 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 02/24/2015 No Yes of calf and inflammation  V56.433 Chronic venous hypertension (idiopathic) with ulcer 10/04/2016 No Yes and inflammation of right lower extremity L89.616 Pressure-induced deep tissue damage of right heel 12/14/2019 No Yes Inactive Problems ICD-10 Code Description Active Date Inactive Date 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/19/2016 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 [name] Primary Care Provider: [name] Other Clinician: Referring Provider: Treating Provider/Extender:Elva Breaker,  Leotis Shames, MARY Weeks in Treatment: 421 Subjective History of Present Illness (HPI) this is a patient who initially came to [place] for wounds on the medial malleoli bilaterally as well as her upper medial lower extremities bilaterally. These wounds eventually healed with assistance of Apligraf's bilaterally. While this was occurring she developed the current wound which opened into a fairly substantial wound on the right lower extremity. These are mostly secondary to venous stasis physiology however the patient also has underlying scleroderma, pulmonary hypertension. The wound has been making good progress lately with the Hydrofera Blue-based dressing. 03/17/2015; patient had a arterial evaluation a year ago. Her right ABI was 0.86 left was 1.0. Toe brachial index was 0.41 on the right 0.45 on the left. Her bilateral common femoral artery waveforms were triphasic. Her white popliteal posterior tibial artery and anterior tibial artery waveforms were monophasic with good amplitude. Luteal artery waveforms were biphasic it was felt that her bilateral great toe pressures are of normal although adequate for tissue healing. 03/24/2015. The condition of this wound is not really improved that. He was covered with as fibrinous surface slough and eschar. This underwent an aggressive debridement with both a curette and scalpel. I still cannot really get down to what I can would consider to be a viable surface. There is no evidence of infection. Previous workup for ischemia roughly a year ago was negative nevertheless I think that continues to be a concern 04/07/15. The patient arrived for application of second Apligraf. Once again the surface of this wound is certainly less viable than I would like for an advanced treatment option. An aggressive debridement was done. She developed some arteriolar bleeding which required that along pressure and silver alginate. 04/21/15: change in the condition of this wound.  Once again it is covered in a gelatinous surface slough. After debridement today surface of the wound looks somewhat better but now a heeling surface 05/05/15 Apligraf #4 applied.Still a lot of slough on this wound. 05/19/15 Apligraf #5 applied. Still a lot of slough on this wound I did not aggressively remove this 06/02/15; continued copious amounts of surface slough. This debridement fairly easily. 06/08/2015 -- the last time she had a venous duplex study done was over 3 years ago and the surgery was prior to that. I have recommended that she sees Dr. early for a another opinion regarding a repeat venous duplex and possibly more endovenous ablation of vein stripping of micro-phlebotomies. 06/16/15; wound has a gelatinous surface eschar that the debridement fairly easily to a point. I don't disagree with the venous workup and perhaps even arterial re-evaluation. She is on prednisone 5 mg and continue his medications for her pulmonary artery hypertension I am not sure if the latter have any wound care healing  issues I would need to investigate this. 06/23/15 continues with a gelatinous surface eschar with of fibrinous underlying. What I can see of this wound does not look unhealthy however I just can't get this material which I think is 2 different layers off. Empiric culture done 06/30/15; unfortunately not a lot of change in this wound. A gelatinous surface eschar is easily removed however it has a tight fibrinous surface underneath the. 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 The wound comes back and basically and unchanged state the. She has a gelatinous surface that is more easily removed however there is a tightly fibrinous surface underneath the. There is no evidence of infection. She has a vascular follow-up next month. I would have to inject her in order to do a more aggressive debridement of this area 07/21/15 the wound is roughly in the same state albeit the debridement is  done with greater ease. There is less of the fibrinous eschar underneath the. There is no evidence of infection. She has follow-up with vascular surgery next week. No evidence of surrounding infection. Her original distal wounds healed while this one formed. 07/28/15; wound is easier to debride. No wound erythema. She is seeing Dr. [name] next week. 08/18/15 Has been to [place] for repeat ablation. Have been using medihoney pad with some improvement 08/25/15; absolutely no change in the condition of this wound in either its overall size or surface condition in many months now. At one point I had this down to [place] healthier surface I think with [facility] however this did not actually progress towards closure. Do not believe that the wound has actually deteriorated in terms of volume at all. We have been using a medihoney pad which allows easier debridement of the gelatinous eschar but again no overall actual improvement. the patient is going towards an ablation with pain and vascular which I think is scheduled for next week. The only other investigation that I could first see would be a biopsy. She does have underlying scleroderma 09/02/15 eschar is much easier to debridement however the base of this does not look particularly vibrant. We changed to Iodoflex. The patient had her ablation earlier this week 09/09/15 again the debridement over the base of this wound is easier and the base of the wound looks considerably better. We will continue the Iodoflex. Dr. Tawni Millers has expressed his satisfaction with the result of her ablation 09/15/15 once again the wound is relatively free of surface eschar. No debridement was done today. It has a pale- looking base to it. although this is not as deep as it once was it seems to be expanding especially inferiorly. She has had recent venous ablation but this is no closer to healing.I've gone ahead and done a punch biopsy this from the inferior part of the wound  close to normal skin 09/22/15: the wound is relatively free of surface eschar. There is some surrounding eschar. I'm not exactly sure at what level the surface is that I am seeing. 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 No evidence of atypical infection, malignancy. 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 No debridement was done 09/29/15; the wound surface is relatively free of surface eschar. There is eschar surrounding the walls of the wound. Aside from the improvement in the amount of surface slough. This wound has not progressed any towards closure. There is not even a surface that looks like there at this is ready.  There is no evidence of any infection nor maligancy based on biopsy I did on 9/15. I continued with the Iodoflex however I am looking towards some alternative to try to promote some closure or filling in of this surface. Consider triple layer Oasis. Collagen did not result in adequate control of the surface slough 10/13/15; the patient was in hospital last week with severe anemia. The wound looks somewhat better after debridement although there is widening medially. There is no evidence of infection. 10/20/15; patient's wound on the right lateral lower leg is essentially unchanged. This underwent a light surface debridement and in general the debridement is easier and the surface looks improved. I noted in doing this on the side of the wound what appears to be a piece of calcium deposition. The patient noted that she had previous things on the tips of her fingers. 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 She follows with rheumatology and I have asked them to talk to her about this. In view of that S the nonhealing ulcer may have something to do with calcinosis and also unrelieved Raynaud's disease in this area. I should note that her original wounds  on the right and left medial malleolus and the inner aspects of both legs just below the knees did however heal over 10/30/15; the patient's wound on the right lateral lower leg is essentially unchanged. I was able to remove some calcified material from the medial wound edge. 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 Otherwise the wound appears essentially unchanged there is less adherent surface eschar. Some of the calcified material was sent to pathology for analysis 11/17/15. The patient's wound on the right lateral leg is essentially unchanged. Wider Medially. 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 There is some surface slough but I did not debridement this. No evidence of surrounding infection. 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 This will make healing difficult 12/15/15; the wound has less depth surface has a fibrinous slough and calcifications in the wound edge no evidence of infection 12/22/15; the wound definitely has less Fibrinous slough on the base. Calcifications around the wound edges are still evident. Although the wound bed looks healthier it is still pale in appearance. Previous biopsy did not show malignancy 01/04/15; surgical debridement of nonviable slough and subcutaneous tissue the wound cleans up quite nicely but appears to be expanding outward calcifications around the wound edges are still there. 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 She is to see her rheumatologist I'll see if he has any opinions on this. My punch biopsy done in September did not show calcifications although these are  clearly evident. 01/19/16 light selective debridement of nonviable surface slough. There is epithelialization medially. This gives me reason for cautious optimism. She has been to see her rheumatologist, there is nothing that can be done for this type of soft tissue calcification associated with scleroderma 02/02/16 no debridement although there is a light surface slough. She has 2 peninsulas of skin 1 inferiorly and one medially. We continue to make a slight and slow but definite progressive here 02/16/16; light surface debridement with  more attention to the circumference of the wound bed where the fibrinous eschar is more prevalent. No calcifications detected. She seems to have done nicely with the [facility] with some epithelialization and some improvement in the overall wound volume. 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 Continued attention to the circumference of the wound where the fibrinous eschar in calcinosis or prevalent. No calcifications were detected. She has continued improvement with Hydrofera Blue. The wound is no longer as deep 03/15/16 surgical debridement done to remove surface escha Especially around the circumference of the wound where there is nonviable subcutaneous tissue. In spite of this there is considerable improvement in the overall dimensions and depth of the wound. Islands of epithelialization are seen especially medially inferiorly and superiorly to a lesser extent. She is using Hydrofera Blue at home 03/29/16; surgical debridement done to remove surface eschar and nonviable subcutaneous tissue. This cleans up quite nicely mention slightly larger in terms of length and width however depth is less 04/12/16; continued gradual improvement in terms of depth and the condition of the wound base. Debridement is done. Continuing long standing  Hydrofera Blue at home with Kerlix Coban wraps 04/26/16; continued gradual improvement in terms of depth and management as well as condition of the wound base. Surgical debridement done she continues with Hydrofera Blue. 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 She initially came to this clinic venous insufficiency ulcers which have long since healed 05/17/16 continued improvement in terms of the depth and measurements of this wound although she has a tightly adherent fibrinous slough each time. We've been continued with long standing Hydrofera Blue which seems to done as well for this wound is many advanced treatment options. 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 She also has chronic venous insufficiency. She has an irritation on her lateral right ankle secondary to our wraps 05/10/16; wound appears to be smaller especially on the medial aspect and especially in the width. Wound was debridement surface looks better. She is also been in the hospital apparently with anemia again she tells me she had an endoscopy. Since she got home after 3 days which I believe was sometime last week she has had an irritated painful area on the right lateral ankle surrounding the lateral malleolus 05/31/16; much more adherent surface slough today then recently although I don't think the dimensions of changed that much. A more aggressive debridement is required. The irritated area over her medial malleolus is more pruritic and painful and I don't think represents cellulitis 06/14/16; no major change in her wound dimensions however there is more tightly adherent surface slough which is disappointing. As well as she appears to have a new small area medially. Furthermore an irritated uncomfortable area on the lateral aspect of her right foot just below the lateral malleolus. 06/21/16; I'm seeing the patient back in follow-up for the new areas  under her major wound on the right anterior leg. She has been using Hydrofera Blue to this area probably for several months now and although the dressings seem to be helping for quite a period of time I think things have stagnated lately. She comes in today with a relatively tight adherent surface slough and really no changes in the wound shape or dimension. The 2 small areas she had inferiorly are tiny but still open they  seem improved this well. There is no uncontrolled edema and I don't think there is any evidence of cellulitis. 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 Patient has had vascular evaluations both venous and arterial. I have biopsied this area. There is no obvious infection. The worrisome thing today is that she seems to be developing areas of erythema and epithelial damage on the medial aspect of the right foot. Also to a lesser degree inferior to the actual wound itself. Again I see no obvious changes to suggest cellulitis however as this is the only treatable option I will probably give her antibiotics. 07/13/16 no major change in the lady's large anterior right leg wound. Still covered with a very tightly adherent surface slough which is difficult to debridement. There is less erythema around this, culture last week grew pseudomonas I gave her ciprofloxacin. The area on her lateral right malleolus looks better- 08/02/16 the patient's wounds continued to decline. Her original large anterior right leg wound looks deeper. Still adherent surface slough that is difficult to debridement. She has a small area just below this and a punched-out wound just below her lateral malleolus. In the meantime she is been in hospital with apparently an upper GI bleed on Plavix and aspirin. She is now just on Plavix she received 3 units of packed cells 08/23/16; since I last saw this 3 weeks  ago, the open large area on her right leg looks about the same syrup. She has a small satellite lesion just underneath this. The area on her medial right ankle is now a deep necrotic wound. I attempted to debridement this however there is just too much pain. 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 She follows with vascular surgery and is scheduled for an angiogram in early September 09/06/16; the patient is going for an arteriogram tomorrow. Her original large wound on the right calf is about the same the satellite lesion underneath it is about the same however the area on her medial ankle is now deeper with exposed tendon. I am no longer attempting to debride these wounds 09/20/16; the patient has undergone a right femoral endarterectomy and Dacron patch angioplasty. This seems to have helped the flow in her right leg. 10/04/16; Arrived today for aggressive debridement of the wounds on her right calf the original wound the one beneath it and a difficult area over her right lateral leg just above the lateral malleolus. 10/25/16; her 3 open wounds are about the same in terms of dimensions however the surface appears a lot healthier post debridement. Using Iodoflex 10/18/16 we have been using Iodoflex to her wounds which she tolerated with some difficulty. 10/11/16; has been using Santyl for a period of time with some improvement although again very adherent surface slough would prevent any attempted healing this. She has a original wound on the left calf, the satellite underneath that and the most recent wound on the right medial ankle. She has completed revascularization by Dr. early and has had venous ablation earlier. Want to go back to Iodoflex to see if week and get a healthier surface to this wound bed failing this I think she'll need to be taken to the OR and I am prepared to call Dr. Marla Roe to discuss this. She is  obviously not a good candidate for general anesthesia  however.; 11/08/16; I put her on Iodoflex last time to see if I can get the wound bed any healthier and unfortunately today still had tremendous surface slough. 11/15/16; 4 weeks' worth of Iodoflex with not much improvement. Debridement on the major wounds on her left anterior leg is easier however this does not maintain from week to week. The punched-out area on her medial right ankle 11/29/16; I attempted to change the patient last visit to [facility] however she states this burned and was very uncomfortable therefore we gave her permission to go back to the eye out of complex which she already had at home. Also she noted a lot of pain and swelling on the lateral aspect of her leg before she traveled to [facility] for the holidays, I called her in doxycycline over the phone. This seems to have helped 12/06/16; Wounds unchanged by in large. Using Iodoflex 12/13/16; her wounds today actually looks somewhat better. The area on the right lateral lower leg has reasonably healthy-looking granulation and perhaps as actually filled and a bit. Debridement of the 2 wounds on the medial calf is easier and post debridement appears to have a healthier base. We have referred her to Dr. Migdalia Dk for consideration of operative debridement 12/20/16; we have a quick note from Dr. Merri Ray who feels that the patient needs to be referred to an academic center/plastic surgeon. This is due to the complexity of the patient's medical issues as it applies to anything in the OR. We have been using Iodoflex 12/27/16; in general the wounds on the right leg are better in terms of the difficult to remove surface slough. She has been using Iodoflex. She is approved for Apligraf which I anticipated ordering in the next week or 2 when we get a better-looking surface 01/04/16 the deep wounds on the right leg generally look better. Both of them are  debrided further surface slough. The area on the lateral right leg was not debrided. She is approved for Apligraf I think I'll probably order this either next week or the week after depending on the surface of the wounds superiorly. We have been using Iodoflex which will continue until then 01/11/16; the deep wounds on the right leg again have a surface slough that requires debridement. I've not been able to get the wound bed on either one of these wounds down to what would be acceptable for an advanced skin stab- like Apligraf. The area on the medial leg has been improving. We have been using hot Iodoflex to all wounds which seems to do the best at at least limiting the nonviable surface 01/24/17; we have continued Iodoflex and all her wound areas. Her debridement Gen. he is easier and the surface underneath this looks viable. Nevertheless these are large area wounds with exposed muscle at least on the anterior parts. We have ordered Apligraf's for 2 weeks from now. The patient will be away next week 02/07/17; the patient was close to have first Apligraf today however we did not order one. I therefore replaced her Iodoflex. She essentially has 3 large punched-out areas on her right anterior leg and right medial ankle. 02/11/17; Apligraf #1 02/25/17; Apligraf #2. In general some improvement in the right medial ankle and right anterior leg wounds. The larger wound medially perhaps some better 03/11/17; Apligraf #3. In general continued improvement in the right medial ankle and the right anterior leg wounds. 03/25/17 Apligraf #4. In general continued improvement especially on the right medial ankle and the  lower 04/08/17; Apligraf #5 in general continued improvement in all wound sites. 04/22/17; post Apligraf #5 her wound beds continued to look a lot better all of them up to the surface of the surrounding skin. Had a caramel-colored slough that I did not debrided in case there is residual Apligraf effect.  The wounds are as good as I have seen these looking quite some time. 04/29/17; we applied [place] and last week after completing her Apligraf. Wounds look as though they've contracted somewhat although they have a nonviable surface which was problematic in the past. Apparently she has been approved for further Apligraf's. We applied Iodosorb today after debridement. 05/06/17; we're fortunate enough today to be able to apply additional Apligraf approved by her insurance. In general all of her wounds look better Apligraf #6 05/24/17; Apligraf #7 continued improvement in all wounds o3 06/10/17 Apligraf #8. Continued improvement in the surface of all wounds. Not much of an improvement in dimensions as I might a follow 06/24/17 Apligraf #9 continued general improvement although not as much change in the wound areas I might of like. She has a new open area on the right anterior lateral ankle very small and superficial. She also has a necrotic wound on the tip of her right index finger probably secondary to severe uncontrolled Raynaud's phenomenon. She is already on sildenafil and already seen her rheumatologist who gave her Keflex. 07/08/17; Apligraf #10. Generalized improvement although she has a small additional wound just medial to the major wound area. 07/22/17; after discussion we decided not to reorder any further Apligraf's although there is been considerable improvement with these it hasn't been recently. The major wound anteriorly looks better. Smaller wounds beneath this and the more recent one and laterally look about the same. The area on the right lateral lower leg looks about the same 07/29/17 this is a patient who is exceedingly complex. She has advanced scleroderma, crest syndrome including calcinosis, PAD status post revascularization, chronic venous insufficiency status post ablations. She initially presented to this clinic with wounds on her bilateral lower legs however these closed.  More recently we have been dealing with a large open area superiorly on the right anterior leg, a smaller wound underneath this and then another one on the right medial lower extremity. These improve significantly with 10 Apligraf applications. Over the last 2-3 weeks we are making good progress with Hydrofera Blue and these seem to be making progress towards closure 08/19/17; wounds continued to make good improvement with Hydrofera Blue and episodic aggressive debridements 08/26/17; still using Hydrofera Blue. Good improvements 09/09/17; using Hydrofera Blue continued improvement. Area on the lateral part of her right leg has only a small remaining open area. The small inferior satellite region is for all intents and purposes closed. Her major wound also is come in in terms of depth and has advancing epithelialization. 09/16/17; using Hydrofera Blue with continued improvement. The smaller satellite wound. We've closed out today along with a new were satellite wound medially. The area on her medial ankle is still open and her major wound is still open but making improvement. All using Hydrofera Blue. Currently 09/30/17; using Hydrofera Blue. Still a small open area on the lateral right ankle area and her original major wound seems to be making gradual and steady improvement. 10/14/17; still using Hydrofera Blue. Still too small open areas on the right lateral ankle. Her original major wound is horizontal and linear. The most problematic area paradoxically seems to be the area to the  medial area wears I thought it would be the lateral. 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 10/28/17; still using Hydrofera Blue. Right lateral ankle has a very small open area superiorly on the most lateral part of the wound. Her original open wound has 2 open areas now separated by normal skin and we've redefined this. 11/11/17; still using Hydrofera Blue area and  right lateral ankle continues to have a small open area on mostly the lateral part of the wound. Her original wound has 2 small open areas now separated by a considerable amount of normal skin 11/28/17; the patient called in slightly before Thanksgiving to report pain and erythema above the wound on the right leg. In the past this is responded well to treatment for cellulitis and I gave her over the phone doxycycline. She stated this resulted in fairly abrupt improvement. We have been using Hydrofera Blue for a prolonged period of time to the larger wound anteriorly into the remaining wound on her right right lateral ankle. The latter is just about closed with only a small linear area and the bottom of the [place]. 12/02/17; use endoform and left the dressing on since last visit. There is no tenderness and no evidence of infection. 12/16/17; patient has been using Endoform but not making much progress. The 2 punched out open areas anteriorly which were the reminiscence of her major wound appear deeper. The area on the lateral aspect of her right calf also appears deeper. Also she has a puzzling tender swelling above her wound on the right leg. This seems larger than last time. Just above her wounds there appears to be some fluctuance in this area it is not erythematous and there is no crepitus 12/30/17; patient has been using Endoform up until last week we used Hydrofera Blue. Ultrasound of the swelling above her 2 major wounds last time was negative for a fluid collection. I gave her cefaclor for the erythema and tenderness in this area which seems better. Unfortunately both punched out areas anteriorly and the area on her right medial lower leg appear deeper. In fact the lateral of the wounds anteriorly actually looks as though it has exposed tendon and/or muscle sheath. She is not systemically unwell. She is complaining of vaginitis type symptoms presumably Candida from her antibiotics. 01/06/18;  we're using santyl. she has 2 punched out areas anteriorly which were initially part of a large wound. Unfortunately medially this is now open to tendon/muscle. All the wounds have the same adherent very difficult to debride surface. 01/20/18; 2 week follow-up using Santyl. She has the 2 punched out areas anteriorly which were initially part of her large surface wound there. Medially this still has exposed muscle. All of these have the same tightly adherent necrotic surface which requires debridement. PuraPly was not accepted by the patient's insurance however her insurance I think it changed therefore we are going to run Apligraf to gain 02/03/18; the patient has been using Santyl. The wound on the right lateral ankle looks improved and the 2 areas anteriorly on the right leg looks about the same. The medial one has exposed muscle. The lateral 1 requiresdebridement. We use PuraPly today for the first week 02/10/18; PuraPly #2. The patient has 3 wounds. The area on the right lateral ankle, 2 areas anteriorly that were part of her original large wound in this area the medial one has exposed muscle. All of the wounds were lightly debrided with a  number 3 curet. PuraPly #2 applied the lateral wound on the calf and the right lateral ankle look better. 02/17/18; PuraPly #3. Patient has 3 wounds. The area on the right lateral ankle in 2 areas internally that were part of her original large wound. The lateral area has exposed muscle. She arrived with some complaints of pain around the right ankle. 02/24/18; PuraPly #4; not much change in any of the 3 wound areas. Right lateral ankle, right lateral calf. Both of these required debridement with a #3 curet. She tolerates this marginally. The area on the medial leg still has exposed muscle. Not much change in dimensions 03/03/18 PuraPly #5. The area on the medial ankle actually looks better however the 2 separate areas that were original parts of the larger right  anterior leg wound look as though they're attempting to coalesce. 03/10/18; PuraPly #6. The area on the medial ankle actually continues to look and measures smaller however the 2 separate areas that were part of the original large wound on the right anterior leg have now coalesced. There hasn't been much improvement here. The lateral area actually has underlying exposed muscle 03/17/18-she is here in follow-up evaluation for ulcerations to the right lower extremity. She is voicing no complaints or concerns. She tolerated debridement. Puraply#7 placed 03/24/18; difficult right lower extremity ulcerations. PuraPly #8 place. She is been approved for [employer]. She did very well with Apligraf today however she is apparently reached her "lifetime max" 03/31/18; marginal improvement with PuraPly although her wounds looked as good as they have in several weeks today. Used TheraSkins #1 04/14/18 TheraSkin #2 today 04/28/18 TheraSkins #3. Wound slightly improved 05/12/18; TheraSkin skin #4. Wound response has been variable. 05/27/18 TheraSkin #5. Generally improvement in all wound areas. I've also put her in 3 layer compression to help with the severe venous hypertension 06/09/18; patient is done quite well with the TheraSkins unfortunately we have no further applications. I also put her in 3 layer compression last week and that really seems to of helped. 06/16/18; we have been using silver collagen. Wounds are smaller. Still be open area to the muscle layer of her calf however even that is contracted somewhat. She tells me that at night sometimes she has pain on the right lateral calf at the site of her lower wound. Notable that I put her into 3 layer compression about 3 weeks ago. She states that she dangles her leg over the bed that makes it feel better but she does not describe claudication during the day ooShe is going to call her secondary insurance to see if they will continue to cover advanced treatment  products I have reviewed her arterial studies from 01/22/17; this showed an ABI in the right of 1 and on the left noncompressible. TBI on the right at 0.30 on the left at 0.34. It is therefore possible she has significant PAD with medial calcification falsely elevating her ABI into the normal range. I'll need to be careful about asking her about this next week it's possible the 3 layer compression is too much 06/23/18; was able to reapply TheraSkin 1 today. Edema control is good and she is not complaining of pain no claudication 07/07/18;no major change. New wound which was apparently a taper removal injury today in our clinic between her 2 wounds on the right calf TheraSkins #2 07/14/18; I think there is some improvement in the right lateral ankle and the medial part of her wound. There is still exposed muscle medially. 07/28/18; two-week  follow-up. TheraSkins#3. Unfortunately no major change. She is not a candidate I don't think for skin grafting due to severe venous hypertension associated with her scleroderma and pulmonary hypertension 08/11/18 Patient is here today for her [employer] application #65 (#5 of the second set). She seems to be doing well and in the base of the wound appears to show some progress at this point. This is the last approved [employer] of the second set. 08/25/18; she has completed TheraSkin. There has been some improvement on the right lateral calf wound as well as the anterior leg wounds. The open area to muscle medially on the anterior leg wound is smaller. I'm going to transition her back to [facility] under Kerlix Coban change every second day. She reports that she had some calcification removed from her right upper arm. We have had previous problems with calcifications in her wounds on her legs but that has not happened recently 09/08/18;using Hydrofera Blue on both her wound areas. Wounds seem to of contracted nicely. She uses Kerlix Coban wrap and changes at home  herself 09/22/2018; using Hydrofera Blue on both her wound areas. Dimensions seem to have come down somewhat. There is certainly less depth in the medial part of the mid tibia wound and I do not think there is any exposed muscle at this point. 10/06/2018; 2-week follow-up. Using [facility] on her wound areas. Dimensions have come down nicely both on the right lateral ankle area in the right mid tibial area. She has no new complaints 10/20/2018; 2-week follow-up. She is using Hydrofera Blue. Not much change from the last time she was here. The area on the lateral ankle has less depth although it has raised edges on one side. I attempted to remove as much of the raised edge as I could without creating more additional wounds. The area on the right anterior mid tibia area looks the same. 11/03/2018; 2-week follow-up she is using Hydrofera Blue. On the right anterior leg she now has 2 wounds separated by a large area of normal skin. The area on the medial part still has I think exposed muscle although this area itself is a lot smaller. The area laterally has some depth. Both areas with necrotic debris. ooThe area on her right lateral ankle has come in nicely 11/17/2018; patient continues to use Hydrofera Blue. We have been increasing separation of the 2 wounds anteriorly which were at one point joint the area on the right lateral calf continues to have I think some improvement in depth. 12/08/2018; patient continues to use Hydrofera Blue. There is some improvement in the area on the right lateral calf. The 2 areas that were initially part of the original large wound in the mid right tibia are probably about the same. In fact the medial area is probably somewhat larger. We will run puraply through the patient's insurance 12/22/2018; she has been using Hydrofera Blue. We have small wounds on the right lateral calf and 2 small areas that were initially part of a large wound in the right mid tibia. We  applied pure apply #1 today. 12/29/2018; we applied puraply #2. Her wounds look somewhat better especially on the right lateral calf and the lateral part of her original wound in the mid tibial area. 01/05/2019; perhaps slightly improved in terms of wound bed condition but certainly not as much improvement as I might of liked. Puraply #3 1/13: we did not have a correct sized puraply to apply. wounds more pinched out looking, I increased  her compression to 3 layer last week to help with significant multilevel venous hypertension. Since then I've reviewed her arterial status. She has a right femoral endarterectomy and a distal left SFA stent. She was being followed by Dr. [name] for a period however she does not appear to have seen him in 3 years. I will set up an appointment. 1/20. The patient has an additional wound on the right lateral calf between the distal wound and proximal wounds. We did not have Puraply last week. Still does not have a follow-up with Dr. early 1/27: Follow-up with Dr. early has been arranged apparently with follow-up noninvasive studies. Wounds are measuring roughly the same although they certainly look smaller 2/3; the patient had non invasive studies. Her ABIs on the right were 0.83 and on the left 1.02 however there was no great toe pressure bilaterally. Also worrisome monophasic waveforms at the PTA and dorsalis pedis. We are still using Puraply. We have had some improvement in all of the wounds especially the lateral part of the mid tibial area. 2/10; sees Dr. early of vein and vascular re-arterial studies next week. Puraply reapplied today. 2/17; Dr. early of vein and vascular his appointment is tomorrow. Puraply reapplied after debridement of all wounds 2/24; the patient saw Dr. early I reviewed his note. sHe noted the previous right femoral endarterectomy with a Dacron patch. He also noted the normal ABI and the monophasic waveforms suggesting tibial disease. Overall  he did not feel that she had any evidence of arterial insufficiency that would impair her wound healing. He did note her venous disease as well. He suggested PRN follow-up. 3/2; I had the last puraply applied today. The original wounds over the mid tibia area are improved where is the area on the right lower leg is not 3/9; wounds are smaller especially in the right mid tibia perhaps slightly in the right lateral calf. We finished with puraply and went to endoform today 3/23; the patient arrives after 2 weeks. She has been using endoform. I think all of her wounds look slightly better which includes the area on the right lateral calf just above the right lateral malleolus and the 2 in the right mid tibia which were initially part of the same wound. 4/27; TELEHEALTH visit; the patient was seen for telehealth visit today with her consent in the middle of the worldwide epidemic. Since she was last here she called in for antibiotics with pain and tenderness around the area on the right medial ankle. I gave her empiric doxycycline. She states this feels better. She is using endoform on both of these areas 5/11 TELEHEALTH; the patient was seen for telehealth visit today. She was accompanied at home by her husband. She has severe pulmonary hypertension accompanied scleroderma and in the face of the Covid epidemic cannot be safely brought into our clinic. We have been using endoform on her wound areas. There are essentially 3 wound areas now in the left mid tibia now 2 open areas that it one point were connected and one on the right lateral ankle just above the malleolus. 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 She cannot come to clinic. The wounds were reviewed today via telehealth. She has severe chronic  venous hypertension which I think is centrally mediated. She has wounds on her right anterior tibia and  right lateral ankle area. These are chronic. She has been using endoform. 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 We have been following her from telehealth. She has severe chronic venous hypertension which may be mostly centrally mediated secondary to right heart heart failure. She has wounds on her right anterior tibia and right lateral ankle these are chronic we have been using endoform 6/22; TELEHEALTH; this patient was seen today via telehealth. 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 Since the last time we had contact with this patient she developed some pain and erythema around the wound on her right lateral malleolus/ankle and we put in antibiotics for her. This is resulted in good improvement with resolution of the erythema and tenderness. I changed her to silver alginate last time. We had been using endoform for an extended period of time 7/13; TELEHEALTH; this patient was seen today via telehealth. 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 She would continue to be at a prohibitive risk to be brought into our clinic unless this was absolutely necessary. These visits have been done with her approval as well as her husband. We have been using silver alginate to the areas on the right mid tibia and right lateral lower leg. 7/27 TELEHEALTH; patient was seen along with her husband today via telehealth. 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 We changed her to sample last visit. She has 2 areas a chronic wound on her right mid tibia and  one just above her ankle. These were not the original wounds when she came into this clinic but she developed them during treatment 8/17; she comes in for her first face-to-face visit in a long period. She has a remaining area just medial to the right tibia which is the last open part of her large wound across this area. She also has an area on the right lateral lower leg. We prescribed Santyl last telehealth visit but they were concerned that this was making a deeper so they put silver alginate on it last week. Her husband changes the dressings. 8/31; using Santyl to the 2 wound areas some improvement in wound surfaces. Husband has surgery in 2 weeks we will put her out 3 weeks. Any of the advanced treatment options that I can think of that would be eligible for this wound would also cause her to have to come in weekly. The risk that the patient is just too high 9/21. Using Santyl to the 2 wound areas. Both of these are somewhat better although the medial mid tibia area still has exposed muscle. Lateral ankle requiring debridement. Using Santyl 10/12; using Santyl to the 2 wound areas. One on the right lateral ankle and the other in the medial calf which still has exposed muscle. Both areas have come down in size and have better looking surfaces. She has made nice progress with santyl 11/2; we have been using Santyl to the 2 wound areas. Right lateral ankle and the other in the mid tibia area the medial part of this still has exposed muscle. 11/23/2019 on evaluation today patient appears to be doing about the same really with regard to her wounds. She is actually not very  pleased with how things seem to be progressing at this point she tells me that she really has not noted much improvement unfortunately. With that being said there is no signs of active infection at this time. There is some slough buildup noted at this time which again along with some dry skin around the edges of the wound  I think would benefit her to try to debride some of this away. Fortunately her pain is doing fairly well. She still has exposed muscle in the right medial/tibial area. 12/14; TELEHEALTH; she was changed to [facility] to the right calf wound and right lateral ankle wound when she was here last time. Unfortunately since then she had a fall with a pelvic fracture and a fracture of her wrist. She was apparently hospitalized for 5 days. I have not looked at her discharge summary. She apparently came out of the hospital with a blister on her right heel. She was seen today via telehealth by myself and our case manager. The patient and her husband were present. She has been using Hydrofera Blue at our direction from the last time she was in the clinic. There is been no major improvement in fact the areas appear deeper and with a less viable surface 01/04/2020; TELEHEALTH; the patient was seen today in accompaniment of her husband and our nurse. She has 3 open areas 1 on the right medial mid tibia, one on the right lateral ankle and a large eschared pressure area on the right heel. We have been using Iodoflex to the 2 original wounds. 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 It is simply too perilous for her to be seen in any other way 1/26; TELEHEALTH; the patient was seen today in accompaniment of her husband and our RN one of our nurses. She still has the 3 open areas 1 on the right medial mid tibia which is the remanent of a more extensive wound in this area, one on the right lateral ankle and a large eschared pressure area on the right heel. We have been using Iodoflex to the 2 original wounds and a bed at 9 application to the eschared area on the heel 2/15; the first time we have seen this patient and then several months out of concern for the pandemic. She had a large horizontal wound in the mid tibia. Only the medial aspect of this is still open. Area on the  lateral ankle is just about closed. She had a new pressure ulcer on the right heel which I have removed some of the eschar. We have been using Iodoflex which I will continue. The area in the mid tibia has a round circle in the middle of exposed muscle. I think we would have to use an advanced treatment product to stimulate granulation over this area. We will run this through her insurance. She is not eligible for plastic surgery for many reasons 3/1; TELEHEALTH. The patient was seen today by telehealth. 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 We have been using Iodoflex to the wound areas which include the right anterior mid tibia, right lateral ankle and the right heel. All of these were reviewed 3/8; the patient's wound just above the right ankle is closed. She still has a contracting black eschar on the heel where she had a pressure ulcer. The medial part of her original wound on the mid tibia has exposed muscle.  We have made really made no progress in this area although we have managed to get a lot of the original wound in this area to close. We used Apligraf #1 today 3/22; the patient's ankle wound remains closed. She still has a contracting black eschar on the heel although it seems to have less surface area. It still not clear whether there is any depth here. We used Apligraf #2 today in an attempt to get granulation over the exposed muscle and what is left of her mid tibia area. 4/5; everything is closed except the medial aspect of the mid tibia wound, as well as the pressure injury on the right heel. She has been using Betadine to the right heel which has been gradually contracting. We used Apligraf #3 today. 4/19; Apligraf #4. Still a pressure area on the right heel she has been using Betadine superficial excoriated areas she has been applying salicylic acid to on the anterior leg and the  thick horn on the leg just above her wound area Objective Constitutional Patient is hypertensive.. Pulse regular and within target range for patient.Marland Kitchen Respirations regular, non-labored and within target range.. Temperature is normal and within the target range for the patient.Marland Kitchen Appears in no distress. Vitals Time Taken: 9:05 AM, Height: 68 in, Weight: 132 lbs, BMI: 20.1, Temperature: 98.4 F, Pulse: 74 bpm, Respiratory Rate: 19 breaths/min, Blood Pressure: 155/52 mmHg. General Notes: Wound exam ooMidportion of the tibia medially. Last remanent of a fairly substantial wound however there is still exposed muscle in this area has not contracted at all. I was hoping to get granulation over this area. The wound area does not look bad however there is undermining superiorly and laterally. Integumentary (Hair, Skin) Wound #16 status is Open. Original cause of wound was Pressure Injury. The wound is located on the Right Calcaneus. The wound measures 1.2cm length x 1.7cm width x 0.1cm depth; 1.602cm^2 area and 0.16cm^3 volume. There is no tunneling or undermining noted. There is a small amount of serous drainage noted. The wound margin is distinct with the outline attached to the wound base. There is small (1-33%) pink granulation within the wound bed. There is a large (67-100%) amount of necrotic tissue within the wound bed including Eschar and Adherent Slough. Wound #17 status is Open. Original cause of wound was Gradually Appeared. The wound is located on the Right,Anterior Lower Leg. The wound measures 1.2cm length x 1cm width x 0.1cm depth; 0.942cm^2 area and 0.094cm^3 volume. There is Fat Layer (Subcutaneous Tissue) Exposed exposed. There is no tunneling or undermining noted. There is a medium amount of serous drainage noted. The wound margin is distinct with the outline attached to the wound base. There is large (67-100%) pink granulation within the wound bed. There is a small (1-33%) amount of  necrotic tissue within the wound bed including Adherent Slough. Wound #18 status is Open. Original cause of wound was Gradually Appeared. The wound is located on the Right,Lateral Lower Leg. The wound measures 1cm length x 0.5cm width x 0.1cm depth; 0.393cm^2 area and 0.039cm^3 volume. There is Fat Layer (Subcutaneous Tissue) Exposed exposed. There is no tunneling or undermining noted. There is a small amount of serous drainage noted. The wound margin is distinct with the outline attached to the wound base. There is large (67-100%) pink granulation within the wound bed. There is no necrotic tissue within the wound bed. Wound #5 status is Open. Original cause of wound was Gradually Appeared. The wound is located on the Right,Medial  Lower Leg. The wound measures 2.2cm length x 2cm width x 0.2cm depth; 3.456cm^2 area and 0.691cm^3 volume. There is Fat Layer (Subcutaneous Tissue) Exposed exposed. There is no tunneling or undermining noted. There is a medium amount of serous drainage noted. The wound margin is well defined and not attached to the wound base. There is medium (34-66%) pink granulation within the wound bed. There is a medium (34-66%) amount of necrotic tissue within the wound bed including Adherent Slough. 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 There was a Excisional Skin/Subcutaneous Tissue Debridement with a total area of 4.4  sq cm performed by Ricard Dillon., MD. With the following instrument(s): Curette to remove Viable and Non-Viable tissue/material. Material removed includes Subcutaneous Tissue and Slough and. No specimens were taken. A time out was conducted at 09:40, prior to the start of the procedure. A Minimum amount of bleeding was controlled with Pressure. The procedure was tolerated well with a pain level of 0 throughout and a pain level of 0 following the procedure. Post Debridement Measurements: 2.2cm length x 2cm width x 0.2cm depth; 0.691cm^3 volume. Character of Wound/Ulcer Post Debridement is improved. Severity of Tissue Post Debridement is: Fat layer exposed. Post procedure Diagnosis Wound #5: Same as Pre-Procedure Pre-procedure diagnosis of Wound #5 is a Venous Leg Ulcer located on the Right,Medial Lower Leg. A skin graft procedure using a bioengineered skin substitute/cellular or tissue based product was performed by Ricard Dillon., MD with the following instrument(s): N/A. Apligraf was applied and secured with Steri-Strips. 22 sq cm of product was utilized and 22 sq cm was wasted due to wound size. Post Application, adaptic, gauze was applied. A Time Out was conducted at 09:42, prior to the start of the procedure. The procedure was tolerated well with a pain level of 0 throughout and a pain level of 0 following the procedure. Post procedure Diagnosis Wound #5: Same as Pre-Procedure . Plan Follow-up Appointments: Return Appointment in 2 weeks. Dressing Change Frequency: Wound #16 Right Calcaneus: Change Dressing every other day. Wound #17 Right,Anterior Lower Leg: Change Dressing every other day. Wound #18 Right,Lateral Lower Leg: Change Dressing every other day. Wound #5 Right,Medial Lower Leg: Change Dressing every other day. - ***DO NOT REMOVE STERI STRIPS - CHANGE OUTSIDE BANDAGE ONLY*** Skin Barriers/Peri-Wound Care: Moisturizing lotion - both legs Wound Cleansing: May  shower and wash wound with soap and water. - on days that dressing is changed Primary Wound Dressing: Wound #16 Right Calcaneus: Other: - paint with Betadine Wound #17 Right,Anterior Lower Leg: Other: - Salicylic Acid (per Dermatology) Wound #18 Right,Lateral Lower Leg: Other: - Salicylic Acid (per Dermatology) Wound #5 Right,Medial Lower Leg: Skin Substitute Application - Apligraf #4 Secondary Dressing: Wound #17 Right,Anterior Lower Leg: Dry Gauze Dry Gauze Wound #18 Right,Lateral Lower Leg: Dry Gauze Wound #5 Right,Medial Lower Leg: Adaptic Dressing - secure with steri strips Dry Gauze Wound #16 Right Calcaneus: Dry Gauze Heel Cup Edema Control: Kerlix and Coban - Right Lower Extremity Avoid standing for long periods of  time Elevate legs to the level of the heart or above for 30 minutes daily and/or when sitting, a frequency of: - throughout the day Off-Loading: Turn and reposition every 2 hours Other: - float heels off of bed/chair with pillow under calves 1. Apligraf #4 with debridement as noted 2. She is continuing with salicylic acid to the superficial hyperkeratotic areas superiorly as directed by dermatology 3. Continued Betadine to the heel although I think we might be left with a wound care at some point she has not wanted me to debride this Electronic Signature(s) Signed: 04/18/2020 5:34:04 PM By: Linton Ham MD Entered By: Linton Ham on 04/18/2020 09:54:01 -------------------------------------------------------------------------------- SuperBill Details Patient Name: Date of Service: [name] [name] 04/18/2020 Medical Record Number:[id number] Patient Account Number: [id number] Date of Birth/Sex: Treating RN: January 26, 1951 (69 y.o. 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 I87.331 Chronic venous hypertension (idiopathic) with ulcer and inflammation of right lower extremity L89.616 Pressure-induced deep tissue damage of right heel Facility Procedures CPT4 Code Description: [id number] (Facility Use Only) Apligraf 1 SQ CM [id number] - SKIN SUB GRAFT TRNK/ARM/LEG ICD-10 Diagnosis Description I83.222 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

## 2020-04-20 DIAGNOSIS — I739 Peripheral vascular disease, unspecified: Secondary | ICD-10-CM | POA: Diagnosis not present

## 2020-04-20 DIAGNOSIS — S32592D Other specified fracture of left pubis, subsequent encounter for fracture with routine healing: Secondary | ICD-10-CM | POA: Diagnosis not present

## 2020-04-20 DIAGNOSIS — M349 Systemic sclerosis, unspecified: Secondary | ICD-10-CM | POA: Diagnosis not present

## 2020-04-20 DIAGNOSIS — G629 Polyneuropathy, unspecified: Secondary | ICD-10-CM | POA: Diagnosis not present

## 2020-04-20 DIAGNOSIS — S52592D Other fractures of lower end of left radius, subsequent encounter for closed fracture with routine healing: Secondary | ICD-10-CM | POA: Diagnosis not present

## 2020-04-20 DIAGNOSIS — S62102D Fracture of unspecified carpal bone, left wrist, subsequent encounter for fracture with routine healing: Secondary | ICD-10-CM | POA: Diagnosis not present

## 2020-04-25 DIAGNOSIS — H04123 Dry eye syndrome of bilateral lacrimal glands: Secondary | ICD-10-CM | POA: Diagnosis not present

## 2020-04-25 DIAGNOSIS — H25813 Combined forms of age-related cataract, bilateral: Secondary | ICD-10-CM | POA: Diagnosis not present

## 2020-04-27 ENCOUNTER — Telehealth: Payer: Self-pay | Admitting: Internal Medicine

## 2020-04-27 ENCOUNTER — Other Ambulatory Visit (HOSPITAL_COMMUNITY): Payer: Self-pay | Admitting: Internal Medicine

## 2020-04-27 NOTE — Telephone Encounter (Signed)
Received fax from [facility] discharging patient from PT on 04/20/20

## 2020-04-28 ENCOUNTER — Other Ambulatory Visit: Payer: Self-pay

## 2020-04-28 ENCOUNTER — Encounter: Payer: Self-pay | Admitting: Emergency Medicine

## 2020-04-28 ENCOUNTER — Ambulatory Visit (INDEPENDENT_AMBULATORY_CARE_PROVIDER_SITE_OTHER): Payer: Medicare Other | Admitting: Emergency Medicine

## 2020-04-28 DIAGNOSIS — J849 Interstitial pulmonary disease, unspecified: Secondary | ICD-10-CM

## 2020-04-28 DIAGNOSIS — I2729 Other secondary pulmonary hypertension: Secondary | ICD-10-CM | POA: Diagnosis not present

## 2020-04-28 DIAGNOSIS — J9611 Chronic respiratory failure with hypoxia: Secondary | ICD-10-CM | POA: Diagnosis not present

## 2020-04-28 NOTE — Addendum Note (Signed)
Addended by: Vanessa Barbara on: 04/28/2020 03:35 PM   Modules accepted: Orders

## 2020-04-28 NOTE — Patient Instructions (Addendum)
Please continue your OPSUMIT and sildenafil as you have been taking them. Please continue your prednisone 5 mg once daily. Please continue your oxygen at all times as you have been wearing it. Your echocardiogram from March 2021 showed good right ventricular function, size and pressures. We will perform a ventilation perfusion scan. We will decide the timing of repeat CT scan of the chest depending on how you are doing clinically, how your breathing is progressing Follow with Dr Lamonte Sakai in 3 months or sooner if you have any problems.

## 2020-04-28 NOTE — Addendum Note (Signed)
Addended by: Vanessa Barbara on: 04/28/2020 02:54 PM   Modules accepted: Orders

## 2020-04-28 NOTE — Progress Notes (Signed)
Subjective:    [name], female    DOB: 1951/10/01, 69 y.o.   MRN: [id number] HPI 69 yo woman, never smoker, hx of scleroderma, Raynaud's, non-Hodgkins lymphoma (treated '10), HTN, PAH originally dx in [place] and treated, but meds d/c'd when she improved. Has been seen in by Dr [name], had R heart cath as below in 03/2012. follow up 03/2017.   ROV 01/13/19 --[name] [name] is 11 and has a history of scleroderma with significant associated interstitial lung disease and pulmonary hypertension, chronic hypoxemic respiratory failure.  She currently uses oxygen at 2 to 3 L/min.  She is maintained on prednisone 5 mg daily. Her PAH meds include sildenafil, macitentan,  She has been stable since last time. 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 She was on doxycycline over the last few weeks - for her finger. She has also had some depression. She felt that the doxy contributed to the depression, to some SOB.   ROV 04/28/20 --69 year old never smoker with a history of scleroderma and Raynaud's phenomenon, associated severe PAH.  She was previously prostacyclin for her PAH but has been able to come off of this.  She has associated hypoxemic respiratory failure and is on oxygen at 2 3 L/min.  Were maintaining her on prednisone 5 mg daily for her autoimmune disease, sildenafil and macitentan for her PAH.  Her last chest x-ray was November 2020. Last CT was 12/2018.  She had a fall in 11/2019, suffered pelvic fx and is still rehabbing from this. Did not have syncope.  Her breathing is stable. She does note some decreased functional capacity since the pelvic fracture.  She has noticed some R mid chest pleuritic pain, not with every inspiration, but intermittenty  Echocardiogram 03/17/20 reviewed by me, shows normal RV size and function. PASP was not estimated.   MDM: Testing reviewed as above    04/19/17 --  RA = 4 RV = 80/8 PA = 79/17  (43) PCW = 6 Fick cardiac output/index = 4.1/2.4 PVR = 9.0 WU Ao sat = 94% PA sat = 59%, 62%   Underwent RHC on 04/18/12 as below:  RA = 9  RV = 75/11/14  PA = 71/25 (44)  PCW = 4  Fick cardiac output/index = 3.0/1.7  Thermo CO/CI = 2.7/1.5  PVR = 23.5 [place]  FA sat = 79% on room air (checked 3 times). Sat with finger probe on air 94%  PA sat = 41%, 43%  MW 04/18/12 880 feet  No results for input(s): HGB, HCT, WBC, PLT in the last 168 hours.    Review of Systems  Constitutional: Negative for appetite change, fever and unexpected weight change.  HENT: Negative for congestion, dental problem, ear pain, nosebleeds, postnasal drip, rhinorrhea, sinus pressure, sneezing, sore throat and trouble swallowing.   Eyes: Negative for redness and itching.  Respiratory: Positive for shortness of breath. Negative for cough, chest tightness and wheezing.   Cardiovascular: Negative for palpitations and leg swelling.  Gastrointestinal: Negative for nausea and vomiting.  Genitourinary: Negative for dysuria.  Musculoskeletal: Negative for joint swelling.  Skin: Negative for rash.  Neurological: Negative for headaches.  Hematological: Does not bruise/bleed easily.  Psychiatric/Behavioral: Negative for dysphoric mood. The patient is not nervous/anxious.        Objective:   Physical Exam Vitals:   04/28/20 1355  BP: (!) 130/50  Pulse: 74  Temp: 97.7 F (36.5 C)  TempSrc:  Temporal  SpO2: (!) 30%  Weight: 129 lb 6.4 oz (58.7 kg)  Height: _0  (1.727 m)   Gen: Pleasant, thin, in no distress,  normal affect on pulsed nasal cannula O2  ENT: No lesions,  mouth clear,  oropharynx clear, no postnasal drip  Neck: No JVD, no stridor  Lungs: No use of accessory muscles, few bibasilar insp crackles  Cardiovascular: RRR, heart sounds normal, no murmur or gallops, trace peripheral edema  Musculoskeletal: No deformities, no cyanosis or clubbing  Neuro: alert, non focal  Skin: significant  changes from scleroderma on arms, hands, fingers; several amputations.   Right lower extremity shin and calf are wrapped and dressed      Assessment & Plan:  No problem-specific Assessment & Plan notes found for this encounter.  Baltazar Apo, MD, PhD 04/28/2020, 1:58 PM [facility] [phone] or if no answer [phone]

## 2020-04-28 NOTE — Assessment & Plan Note (Signed)
Continue current oxygen 2 to 3 L/min

## 2020-04-28 NOTE — Assessment & Plan Note (Signed)
Following clinically.  She remains on prednisone 5 mg daily.  We will determine the timing of repeat CT chest based on her clinical status, probably in the next year

## 2020-04-28 NOTE — Addendum Note (Signed)
Addended by: Vanessa Barbara on: 04/28/2020 04:09 PM   Modules accepted: Orders

## 2020-04-28 NOTE — Assessment & Plan Note (Signed)
Continue macitentan, sildenafil.  She is not on systemic anticoagulation.  She describes some intermittent right pleuritic chest pain.  Nothing definitive for pulmonary embolism but she is at risk for either acute or chronic thromboembolic disease.  I think we should perform a VQ scan to evaluate further.  Her echocardiogram from March 2021 shows intact RV function, normal RV size, mildly enlarged RA.  The PASP was not estimated.

## 2020-04-29 ENCOUNTER — Other Ambulatory Visit (HOSPITAL_COMMUNITY): Payer: Self-pay

## 2020-04-29 DIAGNOSIS — I2729 Other secondary pulmonary hypertension: Secondary | ICD-10-CM

## 2020-04-29 MED ORDER — OPSUMIT 10 MG PO TABS: ORAL_TABLET | ORAL | 11 refills | Status: AC

## 2020-05-02 ENCOUNTER — Encounter (HOSPITAL_BASED_OUTPATIENT_CLINIC_OR_DEPARTMENT_OTHER): Payer: Medicare Other | Attending: Internal Medicine | Admitting: Internal Medicine

## 2020-05-02 DIAGNOSIS — I739 Peripheral vascular disease, unspecified: Secondary | ICD-10-CM | POA: Diagnosis not present

## 2020-05-02 DIAGNOSIS — L97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: Secondary | ICD-10-CM | POA: Diagnosis not present

## 2020-05-02 DIAGNOSIS — I10 Essential (primary) hypertension: Secondary | ICD-10-CM | POA: Insufficient documentation

## 2020-05-02 DIAGNOSIS — I87331 Chronic venous hypertension (idiopathic) with ulcer and inflammation of right lower extremity: Secondary | ICD-10-CM | POA: Diagnosis not present

## 2020-05-02 DIAGNOSIS — L97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: Secondary | ICD-10-CM | POA: Diagnosis not present

## 2020-05-02 DIAGNOSIS — L97811 Non-pressure chronic ulcer of other part of right lower leg limited to breakdown of skin: Secondary | ICD-10-CM | POA: Diagnosis not present

## 2020-05-02 DIAGNOSIS — L97312 Non-pressure chronic ulcer of right ankle with fat layer exposed: Secondary | ICD-10-CM | POA: Diagnosis not present

## 2020-05-02 DIAGNOSIS — L89616 Pressure-induced deep tissue damage of right heel: Secondary | ICD-10-CM | POA: Diagnosis not present

## 2020-05-02 DIAGNOSIS — I872 Venous insufficiency (chronic) (peripheral): Secondary | ICD-10-CM | POA: Diagnosis not present

## 2020-05-02 NOTE — Progress Notes (Addendum)
SHANNELLE, ALGUIRE ([id number]) Visit Report for 05/02/2020 Cellular or Tissue Based Product Details Patient Name: Date of Service: [name] [name] 05/02/2020 11:00 A M Medical Record Number: [id number] Patient Account Number: [id number] Date of Birth/Sex: Treating RN: 09-Oct-1951 (69 y.o. [name] Primary Care Provider: [name], [place] RY Other Clinician: Referring Provider: Treating Provider/Extender: [name], [place] R[place] Weeks in Treatment: [phone] Cellular or Tissue Based Product Type Wound #5 Right,Medial Lower Leg Applied to: Performed By: Physician Ricard Dillon., MD Cellular or Tissue Based Product Type: Apligraf Level of Consciousness (Pre-procedure): Awake and Alert Pre-procedure Verification/Time Out Yes - 11:40 Taken: Location: trunk / arms / legs Wound Size (sq cm): 5 Product Size (sq cm): 22 Waste Size (sq cm): 0 Amount of Product Applied (sq cm): 22 Lot #: [id number] Order #: 5 Expiration Date: 05/10/2020 Fenestrated: Yes Instrument: Blade Reconstituted: Yes Solution Type: normal saline Solution Amount: 10 ml Secured: Yes Secured With: Steri-Strips Dressing Applied: Yes Primary Dressing: adaptic, gauze Procedural Pain: 0 Post Procedural Pain: 0 Response to Treatment: Procedure was tolerated well Level of Consciousness (Post- Awake and Alert procedure): Post Procedure Diagnosis Same as Pre-procedure Electronic Signature(s) Signed: 05/02/2020 5:27:06 PM By: Levan Hurst RN, BSN Signed: 05/02/2020 5:33:20 PM By: Linton Ham MD Entered By: Levan Hurst on 05/02/2020 11:46:30 -------------------------------------------------------------------------------- Cellular or Tissue Based Product Details Patient Name: Date of Service: [name] [name], [name] 05/02/2020 11:00 A M Medical Record Number: [id number] Patient Account Number: [id number] Date of Birth/Sex: Treating RN: 08/29/1951 (69 y.o. [name] Primary Care Provider: [name], [place]  RY Other Clinician: Referring Provider: Treating Provider/Extender: [name], MA RY Weeks in Treatment: [phone] Cellular or Tissue Based Product Type Wound #19 Right,Lateral Malleolus Applied to: Performed By: Physician Ricard Dillon., MD Cellular or Tissue Based Product Type: Apligraf Level of Consciousness (Pre-procedure): Awake and Alert Pre-procedure Verification/Time Out Yes - 11:40 Taken: Location: trunk / arms / legs Wound Size (sq cm): 0.18 Product Size (sq cm): 22 Waste Size (sq cm): 10 Waste Reason: wound size Amount of Product Applied (sq cm): 12 Lot #: [id number] Order #: 5 Expiration Date: 05/10/2020 Fenestrated: Yes Instrument: Blade Reconstituted: Yes Solution Type: normal saline Solution Amount: 10 ml Secured: Yes Secured With: Steri-Strips Dressing Applied: Yes Primary Dressing: adaptic, gauze Procedural Pain: 0 Post Procedural Pain: 0 Response to Treatment: Procedure was tolerated well Level of Consciousness (Post- Awake and Alert procedure): Post Procedure Diagnosis Same as Pre-procedure Electronic Signature(s) Signed: 05/02/2020 5:27:06 PM By: Levan Hurst RN, BSN Signed: 05/02/2020 5:33:20 PM By: Linton Ham MD Entered By: Levan Hurst on 05/02/2020 11:47:25 -------------------------------------------------------------------------------- Debridement Details Patient Name: Date of Service: [name] [name], [name] 05/02/2020 11:00 A M Medical Record Number: [id number] Patient Account Number: [id number] Date of Birth/Sex: Treating RN: 1951-03-11 (69 y.o. [name] Primary Care Provider: [name], [place] RY Other Clinician: Referring Provider: Treating Provider/Extender: [name], MA RY Weeks in Treatment: [phone] Debridement Performed for Assessment: Wound #19 Right,Lateral Malleolus Performed By: Physician Ricard Dillon., MD Debridement Type: Debridement Severity of Tissue Pre Debridement: Fat layer  exposed Level of Consciousness (Pre-procedure): Awake and Alert Pre-procedure Verification/Time Out Yes - 11:35 Taken: Start Time: 11:35 Pain Control: Other : Benzocaine 20% T Area Debrided (L x W): otal 0.6 (cm) x 0.3 (cm) = 0.18 (cm) Tissue and other material debrided: Viable, Non-Viable, Slough, Subcutaneous, Slough Level: Skin/Subcutaneous Tissue Debridement Description: Excisional Instrument:  Curette Bleeding: Minimum Hemostasis Achieved: Pressure End Time: 11:36 Procedural Pain: 0 Post Procedural Pain: 0 Response to Treatment: Procedure was tolerated well Level of Consciousness (Post- Awake and Alert procedure): Post Debridement Measurements of Total Wound Length: (cm) 0.6 Width: (cm) 0.3 Depth: (cm) 0.2 Volume: (cm) 0.028 Character of Wound/Ulcer Post Debridement: Improved Severity of Tissue Post Debridement: Fat layer exposed Post Procedure Diagnosis Same as Pre-procedure Electronic Signature(s) Signed: 05/02/2020 5:27:06 PM By: Levan Hurst RN, BSN Signed: 05/02/2020 5:33:20 PM By: Linton Ham MD Entered By: Levan Hurst on 05/02/2020 11:38:24 -------------------------------------------------------------------------------- HPI Details Patient Name: Date of Service: [name] [name], [name] 05/02/2020 11:00 A M Medical Record Number: [id number] Patient Account Number: [id number] Date of Birth/Sex: Treating RN: 1951-02-10 (69 y.o. [name] Primary Care Provider: [name], [place] RY Other Clinician: Referring Provider: Treating Provider/Extender: [name], MA RY Weeks in Treatment: [phone] History of Present Illness HPI Description: this is a patient who initially came to [place] for wounds on the medial malleoli bilaterally as well as her upper medial lower extremities bilaterally. These wounds eventually healed with assistance of Apligraf's bilaterally. While this was occurring she developed the current wound which opened into a fairly substantial  wound on the right lower extremity. These are mostly secondary to venous stasis physiology however the patient also has underlying scleroderma, pulmonary hypertension. The wound has been making good progress lately with the Hydrofera Blue-based dressing. 03/17/2015; patient had a arterial evaluation a year ago. Her right ABI was 0.86 left was 1.0. T brachial index was 0.41 on the right 0.45 on the left. Her oe bilateral common femoral artery waveforms were triphasic. Her white popliteal posterior tibial artery and anterior tibial artery waveforms were monophasic with good amplitude. Luteal artery waveforms were biphasic it was felt that her bilateral great toe pressures are of normal although adequate for tissue healing. 03/24/2015. The condition of this wound is not really improved that. He was covered with as fibrinous surface slough and eschar. This underwent an aggressive debridement with both a curette and scalpel. I still cannot really get down to what I can would consider to be a viable surface. There is no evidence of infection. Previous workup for ischemia roughly a year ago was negative nevertheless I think that continues to be a concern 04/07/15. The patient arrived for application of second Apligraf. Once again the surface of this wound is certainly less viable than I would like for an advanced treatment option. An aggressive debridement was done. She developed some arteriolar bleeding which required that along pressure and silver alginate. 04/21/15: change in the condition of this wound. Once again it is covered in a gelatinous surface slough. After debridement today surface of the wound looks somewhat better but now a heeling surface 05/05/15 Apligraf #4 applied.Still a lot of slough on this wound. 05/19/15 Apligraf #5 applied. Still a lot of slough on this wound I did not aggressively remove this 06/02/15; continued copious amounts of surface slough. This debridement fairly easily. 06/08/2015 --  the last time she had a venous duplex study done was over 3 years ago and the surgery was prior to that. I have recommended that she sees Dr. early for a another opinion regarding a repeat venous duplex and possibly more endovenous ablation of vein stripping of micro-phlebotomies. 06/16/15; wound has a gelatinous surface eschar that the debridement fairly easily to a point. I don't disagree with the venous workup and perhaps even arterial re-evaluation. She is on  prednisone 5 mg and continue his medications for her pulmonary artery hypertension I am not sure if the latter have any wound care healing issues I would need to investigate this. 06/23/15 continues with a gelatinous surface eschar with of fibrinous underlying. What I can see of this wound does not look unhealthy however I just can't get this material which I think is 2 different layers off. Empiric culture done 06/30/15; unfortunately not a lot of change in this wound. A gelatinous surface eschar is easily removed however it has a tight fibrinous surface underneath the. 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 The wound comes back and basically and unchanged state the. She has a gelatinous surface that is more easily removed however there is a tightly fibrinous surface underneath the. There is no evidence of infection. She has a vascular follow-up next month. I would have to inject her in order to do a more aggressive debridement of this area 07/21/15 the wound is roughly in the same state albeit the debridement is done with greater ease. There is less of the fibrinous eschar underneath the. There is no evidence of infection. She has follow-up with vascular surgery next week. No evidence of surrounding infection. Her original distal wounds healed while this one formed. 07/28/15; wound is easier to debride. No wound erythema. She is seeing Dr. [name] next week. 08/18/15 Has been to [facility] for repeat ablation. Have been  using medihoney pad with some improvement 08/25/15; absolutely no change in the condition of this wound in either its overall size or surface condition in many months now. At one point I had this down to [place] healthier surface I think with [facility] however this did not actually progress towards closure. Do not believe that the wound has actually deteriorated in terms of volume at all. We have been using a medihoney pad which allows easier debridement of the gelatinous eschar but again no overall actual improvement. the patient is going towards an ablation with pain and vascular which I think is scheduled for next week. The only other investigation that I could first see would be a biopsy. She does have underlying scleroderma 09/02/15 eschar is much easier to debridement however the base of this does not look particularly vibrant. We changed to Iodoflex. The patient had her ablation earlier this week 09/09/15 again the debridement over the base of this wound is easier and the base of the wound looks considerably better. We will continue the Iodoflex. Dr. Tawni Millers has expressed his satisfaction with the result of her ablation 09/15/15 once again the wound is relatively free of surface eschar. No debridement was done today. It has a pale-looking base to it. although this is not as deep as it once was it seems to be expanding especially inferiorly. She has had recent venous ablation but this is no closer to healing.I've gone ahead and done a punch biopsy this from the inferior part of the wound close to normal skin 09/22/15: the wound is relatively free of surface eschar. There is some surrounding eschar. I'm not exactly sure at what level the surface is that I am seeing. 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 No evidence of atypical infection, malignancy. 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 No debridement was done 09/29/15; the wound surface is  relatively free of surface eschar. There is eschar surrounding the walls of the wound. Aside from the improvement in the amount of surface  slough. This wound has not progressed any towards closure. There is not even a surface that looks like there at this is ready. There is no evidence of any infection nor maligancy based on biopsy I did on 9/15. I continued with the Iodoflex however I am looking towards some alternative to try to promote some closure or filling in of this surface. Consider triple layer Oasis. Collagen did not result in adequate control of the surface slough 10/13/15; the patient was in hospital last week with severe anemia. The wound looks somewhat better after debridement although there is widening medially. There is no evidence of infection. 10/20/15; patient's wound on the right lateral lower leg is essentially unchanged. This underwent a light surface debridement and in general the debridement is easier and the surface looks improved. I noted in doing this on the side of the wound what appears to be a piece of calcium deposition. The patient noted that she had previous things on the tips of her fingers. 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 She follows with rheumatology and I have asked them to talk to her about this. In view of that S the nonhealing ulcer may have something to do with calcinosis and also unrelieved Raynaud's disease in this area. I should note that her original wounds on the right and left medial malleolus and the inner aspects of both legs just below the knees did however heal over 10/30/15; the patient's wound on the right lateral lower leg is essentially unchanged. I was able to remove some calcified material from the medial wound edge. 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 Otherwise the wound appears  essentially unchanged there is less adherent surface eschar. Some of the calcified material was sent to pathology for analysis 11/17/15. The patient's wound on the right lateral leg is essentially unchanged. Wider Medially. 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 There is some surface slough but I did not debridement this. No evidence of surrounding infection. 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 This will make healing difficult 12/15/15; the wound has less depth surface has a fibrinous slough and calcifications in the wound edge no evidence of infection 12/22/15; the wound definitely has less Fibrinous slough on the base. Calcifications around the wound edges are still evident. Although the wound bed looks healthier it is still pale in appearance. Previous biopsy did not show malignancy 01/04/15; surgical debridement of nonviable slough and subcutaneous tissue the wound cleans up quite nicely but appears to be expanding outward calcifications around the wound edges are still there. 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 She is to see her rheumatologist I'll see if he has any opinions on this. My punch biopsy done in September did not show calcifications although these are clearly evident. 01/19/16 light selective debridement of nonviable surface slough. There is epithelialization medially. This gives me reason for cautious optimism. She has been to see her rheumatologist, there is nothing that can be done for this type of soft tissue calcification associated with scleroderma 02/02/16 no debridement although there is a light surface slough. She has 2 peninsulas of skin  1 inferiorly and one  medially. We continue to make a slight and slow but definite progressive here 02/16/16; light surface debridement with more attention to the circumference of the wound bed where the fibrinous eschar is more prevalent. No calcifications detected. She seems to have done nicely with the [facility] with some epithelialization and some improvement in the overall wound volume. 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 Continued attention to the circumference of the wound where the fibrinous eschar in calcinosis or prevalent. No calcifications were detected. She has continued improvement with Hydrofera Blue. The wound is no longer as deep 03/15/16 surgical debridement done to remove surface escha Especially around the circumference of the wound where there is nonviable subcutaneous tissue. In spite of this there is considerable improvement in the overall dimensions and depth of the wound. Islands of epithelialization are seen especially medially inferiorly and superiorly to a lesser extent. She is using Hydrofera Blue at home 03/29/16; surgical debridement done to remove surface eschar and nonviable subcutaneous tissue. This cleans up quite nicely mention slightly larger in terms of length and width however depth is less 04/12/16; continued gradual improvement in terms of depth and the condition of the wound base. Debridement is done. Continuing long standing Hydrofera Blue at home with Kerlix Coban wraps 04/26/16; continued gradual improvement in terms of depth and management as well as condition of the wound base. Surgical debridement done she continues with Hydrofera Blue. 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 She initially came to this clinic venous insufficiency ulcers which have long since healed 05/17/16 continued  improvement in terms of the depth and measurements of this wound although she has a tightly adherent fibrinous slough each time. We've been continued with long standing Hydrofera Blue which seems to done as well for this wound is many advanced treatment options. 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 She also has chronic venous insufficiency. She has an irritation on her lateral right ankle secondary to our wraps 05/10/16; wound appears to be smaller especially on the medial aspect and especially in the width. Wound was debridement surface looks better. She is also been in the hospital apparently with anemia again she tells me she had an endoscopy. Since she got home after 3 days which I believe was sometime last week she has had an irritated painful area on the right lateral ankle surrounding the lateral malleolus 05/31/16; much more adherent surface slough today then recently although I don't think the dimensions of changed that much. A more aggressive debridement is required. The irritated area over her medial malleolus is more pruritic and painful and I don't think represents cellulitis 06/14/16; no major change in her wound dimensions however there is more tightly adherent surface slough which is disappointing. As well as she appears to have a new small area medially. Furthermore an irritated uncomfortable area on the lateral aspect of her right foot just below the lateral malleolus. 06/21/16; I'm seeing the patient back in follow-up for the new areas under her major wound on the right anterior leg. She has been using Hydrofera Blue to this area probably for several months now and although the dressings seem to be helping for quite a period of time I think things have stagnated lately. She comes in today with a relatively tight adherent surface slough and  really no changes in the wound shape or dimension. The 2 small areas she had inferiorly are tiny but still open  they seem improved this well. There is no uncontrolled edema and I don't think there is any evidence of cellulitis. 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 Patient has had vascular evaluations both venous and arterial. I have biopsied this area. There is no obvious infection. The worrisome thing today is that she seems to be developing areas of erythema and epithelial damage on the medial aspect of the right foot. Also to a lesser degree inferior to the actual wound itself. Again I see no obvious changes to suggest cellulitis however as this is the only treatable option I will probably give her antibiotics. 07/13/16 no major change in the lady's large anterior right leg wound. Still covered with a very tightly adherent surface slough which is difficult to debridement. There is less erythema around this, culture last week grew pseudomonas I gave her ciprofloxacin. The area on her lateral right malleolus looks better- 08/02/16 the patient's wounds continued to decline. Her original large anterior right leg wound looks deeper. Still adherent surface slough that is difficult to debridement. She has a small area just below this and a punched-out wound just below her lateral malleolus. In the meantime she is been in hospital with apparently an upper GI bleed on Plavix and aspirin. She is now just on Plavix she received 3 units of packed cells 08/23/16; since I last saw this 3 weeks ago, the open large area on her right leg looks about the same syrup. She has a small satellite lesion just underneath this. The area on her medial right ankle is now a deep necrotic wound. I attempted to debridement this however there is just too much pain. 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 She follows with vascular surgery and is scheduled for  an angiogram in early September 09/06/16; the patient is going for an arteriogram tomorrow. Her original large wound on the right calf is about the same the satellite lesion underneath it is about the same however the area on her medial ankle is now deeper with exposed tendon. I am no longer attempting to debride these wounds 09/20/16; the patient has undergone a right femoral endarterectomy and Dacron patch angioplasty. This seems to have helped the flow in her right leg. 10/04/16; Arrived today for aggressive debridement of the wounds on her right calf the original wound the one beneath it and a difficult area over her right lateral leg just above the lateral malleolus. 10/25/16; her 3 open wounds are about the same in terms of dimensions however the surface appears a lot healthier post debridement. Using Iodoflex 10/18/16 we have been using Iodoflex to her wounds which she tolerated with some difficulty. 10/11/16; has been using Santyl for a period of time with some improvement although again very adherent surface slough would prevent any attempted healing this. She has a original wound on the left calf, the satellite underneath that and the most recent wound on the right medial ankle. She has completed revascularization by Dr. early and has had venous ablation earlier. Want to go back to Iodoflex to see if week and get a healthier surface to this wound bed failing this I think she'll need to be taken to the  OR and I am prepared to call Dr. Marla Roe to discuss this. She is obviously not a good candidate for general anesthesia however.; 11/08/16; I put her on Iodoflex last time to see if I can get the wound bed any healthier and unfortunately today still had tremendous surface slough. 11/15/16; 4 weeks' worth of Iodoflex with not much improvement. Debridement on the major wounds on her left anterior leg is easier however this does not maintain from week to week. The punched-out area on her medial  right ankle 11/29/16; I attempted to change the patient last visit to [facility] however she states this burned and was very uncomfortable therefore we gave her permission to go back to the eye out of complex which she already had at home. Also she noted a lot of pain and swelling on the lateral aspect of her leg before she traveled to [facility] for the holidays, I called her in doxycycline over the phone. This seems to have helped 12/06/16; Wounds unchanged by in large. Using Iodoflex 12/13/16; her wounds today actually looks somewhat better. The area on the right lateral lower leg has reasonably healthy-looking granulation and perhaps as actually filled and a bit. Debridement of the 2 wounds on the medial calf is easier and post debridement appears to have a healthier base. We have referred her to Dr. Migdalia Dk for consideration of operative debridement 12/20/16; we have a quick note from Dr. Merri Ray who feels that the patient needs to be referred to an academic center/plastic surgeon. This is due to the complexity of the patient's medical issues as it applies to anything in the OR. We have been using Iodoflex 12/27/16; in general the wounds on the right leg are better in terms of the difficult to remove surface slough. She has been using Iodoflex. She is approved for Apligraf which I anticipated ordering in the next week or 2 when we get a better-looking surface 01/04/16 the deep wounds on the right leg generally look better. Both of them are debrided further surface slough. The area on the lateral right leg was not debrided. She is approved for Apligraf I think I'll probably order this either next week or the week after depending on the surface of the wounds superiorly. We have been using Iodoflex which will continue until then 01/11/16; the deep wounds on the right leg again have a surface slough that requires debridement. I've not been able to get the wound bed on either  one of these wounds down to what would be acceptable for an advanced skin stab-like Apligraf. The area on the medial leg has been improving. We have been using hot Iodoflex to all wounds which seems to do the best at at least limiting the nonviable surface 01/24/17; we have continued Iodoflex and all her wound areas. Her debridement Gen. he is easier and the surface underneath this looks viable. Nevertheless these are large area wounds with exposed muscle at least on the anterior parts. We have ordered Apligraf's for 2 weeks from now. The patient will be away next week 02/07/17; the patient was close to have first Apligraf today however we did not order one. I therefore replaced her Iodoflex. She essentially has 3 large punched- out areas on her right anterior leg and right medial ankle. 02/11/17; Apligraf #1 02/25/17; Apligraf #2. In general some improvement in the right medial ankle and right anterior leg wounds. The larger wound medially perhaps some better 03/11/17; Apligraf #3. In general continued improvement in the right  medial ankle and the right anterior leg wounds. 03/25/17 Apligraf #4. In general continued improvement especially on the right medial ankle and the lower 04/08/17; Apligraf #5 in general continued improvement in all wound sites. 04/22/17; post Apligraf #5 her wound beds continued to look a lot better all of them up to the surface of the surrounding skin. Had a caramel-colored slough that I did not debrided in case there is residual Apligraf effect. The wounds are as good as I have seen these looking quite some time. 04/29/17; we applied [place] and last week after completing her Apligraf. Wounds look as though they've contracted somewhat although they have a nonviable surface which was problematic in the past. Apparently she has been approved for further Apligraf's. We applied Iodosorb today after debridement. 05/06/17; we're fortunate enough today to be able to apply additional  Apligraf approved by her insurance. In general all of her wounds look better Apligraf #6 05/24/17; Apligraf #7 continued improvement in all wounds 3 06/10/17 Apligraf #8. Continued improvement in the surface of all wounds. Not much of an improvement in dimensions as I might a follow 06/24/17 Apligraf #9 continued general improvement although not as much change in the wound areas I might of like. She has a new open area on the right anterior lateral ankle very small and superficial. She also has a necrotic wound on the tip of her right index finger probably secondary to severe uncontrolled Raynaud's phenomenon. She is already on sildenafil and already seen her rheumatologist who gave her Keflex. 07/08/17; Apligraf #10. Generalized improvement although she has a small additional wound just medial to the major wound area. 07/22/17; after discussion we decided not to reorder any further Apligraf's although there is been considerable improvement with these it hasn't been recently. The major wound anteriorly looks better. Smaller wounds beneath this and the more recent one and laterally look about the same. The area on the right lateral lower leg looks about the same 07/29/17 this is a patient who is exceedingly complex. She has advanced scleroderma, crest syndrome including calcinosis, PAD status post revascularization, chronic venous insufficiency status post ablations. She initially presented to this clinic with wounds on her bilateral lower legs however these closed. More recently we have been dealing with a large open area superiorly on the right anterior leg, a smaller wound underneath this and then another one on the right medial lower extremity. These improve significantly with 10 Apligraf applications. Over the last 2-3 weeks we are making good progress with Hydrofera Blue and these seem to be making progress towards closure 08/19/17; wounds continued to make good improvement with Hydrofera Blue and  episodic aggressive debridements 08/26/17; still using Hydrofera Blue. Good improvements 09/09/17; using Hydrofera Blue continued improvement. Area on the lateral part of her right leg has only a small remaining open area. The small inferior satellite region is for all intents and purposes closed. Her major wound also is come in in terms of depth and has advancing epithelialization. 09/16/17; using Hydrofera Blue with continued improvement. The smaller satellite wound. We've closed out today along with a new were satellite wound medially. The area on her medial ankle is still open and her major wound is still open but making improvement. All using Hydrofera Blue. Currently 09/30/17; using Hydrofera Blue. Still a small open area on the lateral right ankle area and her original major wound seems to be making gradual and steady improvement. 10/14/17; still using Hydrofera Blue. Still too small open areas on the  right lateral ankle. Her original major wound is horizontal and linear. The most problematic area paradoxically seems to be the area to the medial area wears I thought it would be the lateral. 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 10/28/17; still using Hydrofera Blue. Right lateral ankle has a very small open area superiorly on the most lateral part of the wound. Her original open wound has 2 open areas now separated by normal skin and we've redefined this. 11/11/17; still using Hydrofera Blue area and right lateral ankle continues to have a small open area on mostly the lateral part of the wound. Her original wound has 2 small open areas now separated by a considerable amount of normal skin 11/28/17; the patient called in slightly before Thanksgiving to report pain and erythema above the wound on the right leg. In the past this is responded well to treatment for cellulitis and I gave her over the phone doxycycline. She stated this resulted in  fairly abrupt improvement. We have been using Hydrofera Blue for a prolonged period of time to the larger wound anteriorly into the remaining wound on her right right lateral ankle. The latter is just about closed with only a small linear area and the bottom of the [place]. 12/02/17; use endoform and left the dressing on since last visit. There is no tenderness and no evidence of infection. 12/16/17; patient has been using Endoform but not making much progress. The 2 punched out open areas anteriorly which were the reminiscence of her major wound appear deeper. The area on the lateral aspect of her right calf also appears deeper. Also she has a puzzling tender swelling above her wound on the right leg. This seems larger than last time. Just above her wounds there appears to be some fluctuance in this area it is not erythematous and there is no crepitus 12/30/17; patient has been using Endoform up until last week we used Hydrofera Blue. Ultrasound of the swelling above her 2 major wounds last time was negative for a fluid collection. I gave her cefaclor for the erythema and tenderness in this area which seems better. Unfortunately both punched out areas anteriorly and the area on her right medial lower leg appear deeper. In fact the lateral of the wounds anteriorly actually looks as though it has exposed tendon and/or muscle sheath. She is not systemically unwell. She is complaining of vaginitis type symptoms presumably Candida from her antibiotics. 01/06/18; we're using santyl. she has 2 punched out areas anteriorly which were initially part of a large wound. Unfortunately medially this is now open to tendon/muscle. All the wounds have the same adherent very difficult to debride surface. 01/20/18; 2 week follow-up using Santyl. She has the 2 punched out areas anteriorly which were initially part of her large surface wound there. Medially this still has exposed muscle. All of these have the same tightly  adherent necrotic surface which requires debridement. PuraPly was not accepted by the patient's insurance however her insurance I think it changed therefore we are going to run Apligraf to gain 02/03/18; the patient has been using Santyl. The wound on the right lateral ankle looks improved and the 2 areas anteriorly on the right leg looks about the same. The medial one has exposed muscle. The lateral 1 requiresdebridement. We use PuraPly today for the first week 02/10/18; PuraPly #2. The patient has 3 wounds. The area on the right lateral ankle, 2 areas anteriorly that were part  of her original large wound in this area the medial one has exposed muscle. All of the wounds were lightly debrided with a number 3 curet. PuraPly #2 applied the lateral wound on the calf and the right lateral ankle look better. 02/17/18; PuraPly #3. Patient has 3 wounds. The area on the right lateral ankle in 2 areas internally that were part of her original large wound. The lateral area has exposed muscle. She arrived with some complaints of pain around the right ankle. 02/24/18; PuraPly #4; not much change in any of the 3 wound areas. Right lateral ankle, right lateral calf. Both of these required debridement with a #3 curet. She tolerates this marginally. The area on the medial leg still has exposed muscle. Not much change in dimensions 03/03/18 PuraPly #5. The area on the medial ankle actually looks better however the 2 separate areas that were original parts of the larger right anterior leg wound look as though they're attempting to coalesce. 03/10/18; PuraPly #6. The area on the medial ankle actually continues to look and measures smaller however the 2 separate areas that were part of the original large wound on the right anterior leg have now coalesced. There hasn't been much improvement here. The lateral area actually has underlying exposed muscle 03/17/18-she is here in follow-up evaluation for ulcerations to the right lower  extremity. She is voicing no complaints or concerns. She tolerated debridement. Puraply#7 placed 03/24/18; difficult right lower extremity ulcerations. PuraPly #8 place. She is been approved for [employer]. She did very well with Apligraf today however she is apparently reached her "lifetime max" 03/31/18; marginal improvement with PuraPly although her wounds looked as good as they have in several weeks today. Used TheraSkins #1 04/14/18 TheraSkin #2 today 04/28/18 TheraSkins #3. Wound slightly improved 05/12/18; TheraSkin skin #4. Wound response has been variable. 05/27/18 TheraSkin #5. Generally improvement in all wound areas. I've also put her in 3 layer compression to help with the severe venous hypertension 06/09/18; patient is done quite well with the TheraSkins unfortunately we have no further applications. I also put her in 3 layer compression last week and that really seems to of helped. 06/16/18; we have been using silver collagen. Wounds are smaller. Still be open area to the muscle layer of her calf however even that is contracted somewhat. She tells me that at night sometimes she has pain on the right lateral calf at the site of her lower wound. Notable that I put her into 3 layer compression about 3 weeks ago. She states that she dangles her leg over the bed that makes it feel better but she does not describe claudication during the day She is going to call her secondary insurance to see if they will continue to cover advanced treatment products I have reviewed her arterial studies from 01/22/17; this showed an ABI in the right of 1 and on the left noncompressible. TBI on the right at 0.30 on the left at 0.34. It is therefore possible she has significant PAD with medial calcification falsely elevating her ABI into the normal range. I'll need to be careful about asking her about this next week it's possible the 3 layer compression is too much 06/23/18; was able to reapply TheraSkin 1 today. Edema  control is good and she is not complaining of pain no claudication 07/07/18;no major change. New wound which was apparently a taper removal injury today in our clinic between her 2 wounds on the right calf TheraSkins #2 07/14/18; I think there  is some improvement in the right lateral ankle and the medial part of her wound. There is still exposed muscle medially. 07/28/18; two-week follow-up. TheraSkins#3. Unfortunately no major change. She is not a candidate I don't think for skin grafting due to severe venous hypertension associated with her scleroderma and pulmonary hypertension 08/11/18 Patient is here today for her [employer] application #25 (#5 of the second set). She seems to be doing well and in the base of the wound appears to show some progress at this point. This is the last approved [employer] of the second set. 08/25/18; she has completed TheraSkin. There has been some improvement on the right lateral calf wound as well as the anterior leg wounds. The open area to muscle medially on the anterior leg wound is smaller. I'm going to transition her back to [facility] under Kerlix Coban change every second day. She reports that she had some calcification removed from her right upper arm. We have had previous problems with calcifications in her wounds on her legs but that has not happened recently 09/08/18;using Hydrofera Blue on both her wound areas. Wounds seem to of contracted nicely. She uses Kerlix Coban wrap and changes at home herself 09/22/2018; using Hydrofera Blue on both her wound areas. Dimensions seem to have come down somewhat. There is certainly less depth in the medial part of the mid tibia wound and I do not think there is any exposed muscle at this point. 10/06/2018; 2-week follow-up. Using [facility] on her wound areas. Dimensions have come down nicely both on the right lateral ankle area in the right mid tibial area. She has no new complaints 10/20/2018; 2-week follow-up. She  is using Hydrofera Blue. Not much change from the last time she was here. The area on the lateral ankle has less depth although it has raised edges on one side. I attempted to remove as much of the raised edge as I could without creating more additional wounds. The area on the right anterior mid tibia area looks the same. 11/03/2018; 2-week follow-up she is using Hydrofera Blue. On the right anterior leg she now has 2 wounds separated by a large area of normal skin. The area on the medial part still has I think exposed muscle although this area itself is a lot smaller. The area laterally has some depth. Both areas with necrotic debris. The area on her right lateral ankle has come in nicely 11/17/2018; patient continues to use Hydrofera Blue. We have been increasing separation of the 2 wounds anteriorly which were at one point joint the area on the right lateral calf continues to have I think some improvement in depth. 12/08/2018; patient continues to use Hydrofera Blue. There is some improvement in the area on the right lateral calf. The 2 areas that were initially part of the original large wound in the mid right tibia are probably about the same. In fact the medial area is probably somewhat larger. We will run puraply through the patient's insurance 12/22/2018; she has been using Hydrofera Blue. We have small wounds on the right lateral calf and 2 small areas that were initially part of a large wound in the right mid tibia. We applied pure apply #1 today. 12/29/2018; we applied puraply #2. Her wounds look somewhat better especially on the right lateral calf and the lateral part of her original wound in the mid tibial area. 01/05/2019; perhaps slightly improved in terms of wound bed condition but certainly not as much improvement as I might  of liked. Puraply #3 1/13: we did not have a correct sized puraply to apply. wounds more pinched out looking, I increased her compression to 3 layer last week to  help with significant multilevel venous hypertension. Since then I've reviewed her arterial status. She has a right femoral endarterectomy and a distal left SFA stent. She was being followed by Dr. [name] for a period however she does not appear to have seen him in 3 years. I will set up an appointment. 1/20. The patient has an additional wound on the right lateral calf between the distal wound and proximal wounds. We did not have Puraply last week. Still does not have a follow-up with Dr. early 1/27: Follow-up with Dr. early has been arranged apparently with follow-up noninvasive studies. Wounds are measuring roughly the same although they certainly look smaller 2/3; the patient had non invasive studies. Her ABIs on the right were 0.83 and on the left 1.02 however there was no great toe pressure bilaterally. Also worrisome monophasic waveforms at the PTA and dorsalis pedis. We are still using Puraply. We have had some improvement in all of the wounds especially the lateral part of the mid tibial area. 2/10; sees Dr. early of vein and vascular re-arterial studies next week. Puraply reapplied today. 2/17; Dr. early of vein and vascular his appointment is tomorrow. Puraply reapplied after debridement of all wounds 2/24; the patient saw Dr. early I reviewed his note. sHe noted the previous right femoral endarterectomy with a Dacron patch. He also noted the normal ABI and the monophasic waveforms suggesting tibial disease. Overall he did not feel that she had any evidence of arterial insufficiency that would impair her wound healing. He did note her venous disease as well. He suggested PRN follow-up. 3/2; I had the last puraply applied today. The original wounds over the mid tibia area are improved where is the area on the right lower leg is not 3/9; wounds are smaller especially in the right mid tibia perhaps slightly in the right lateral calf. We finished with puraply and went to endoform today 3/23;  the patient arrives after 2 weeks. She has been using endoform. I think all of her wounds look slightly better which includes the area on the right lateral calf just above the right lateral malleolus and the 2 in the right mid tibia which were initially part of the same wound. 4/27; TELEHEALTH visit; the patient was seen for telehealth visit today with her consent in the middle of the worldwide epidemic. Since she was last here she called in for antibiotics with pain and tenderness around the area on the right medial ankle. I gave her empiric doxycycline. She states this feels better. She is using endoform on both of these areas 5/11 TELEHEALTH; the patient was seen for telehealth visit today. She was accompanied at home by her husband. She has severe pulmonary hypertension accompanied scleroderma and in the face of the Covid epidemic cannot be safely brought into our clinic. We have been using endoform on her wound areas. There are essentially 3 wound areas now in the left mid tibia now 2 open areas that it one point were connected and one on the right lateral ankle just above the malleolus. 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 She cannot come to clinic. The wounds were reviewed today via  telehealth. She has severe chronic venous hypertension which I think is centrally mediated. She has wounds on her right anterior tibia and right lateral ankle area. These are chronic. She has been using endoform. 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 We have been following her from telehealth. She has severe chronic venous hypertension which may be mostly centrally mediated secondary to right heart  heart failure. She has wounds on her right anterior tibia and right lateral ankle these are chronic we have been using endoform 6/22; TELEHEALTH; this patient was seen today via telehealth. 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 Since the last time we had contact with this patient she developed some pain and erythema around the wound on her right lateral malleolus/ankle and we put in antibiotics for her. This is resulted in good improvement with resolution of the erythema and tenderness. I changed her to silver alginate last time. We had been using endoform for an extended period of time 7/13; TELEHEALTH; this patient was seen today via telehealth. 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 She would continue to be at a prohibitive risk to be brought into our clinic unless this was absolutely necessary. These visits have been done with her approval as well as her husband. We have been using silver alginate to the areas on the right mid tibia and right lateral lower leg. 7/27 TELEHEALTH; patient was seen along with her husband today via telehealth. 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 We changed her to sample last visit. She has 2 areas a chronic wound on her right mid tibia and one just above her ankle. These were not the original wounds when she came into this clinic but she developed them during treatment 8/17; she comes in for her first face-to-face visit in a long period. She has a remaining area just medial to the right tibia which is the last open part of her large wound across this area. She also has an area on the right lateral lower leg. We prescribed Santyl last telehealth visit but they were concerned that this was making a deeper so they put silver alginate on it last  week. Her husband changes the dressings. 8/31; using Santyl to the 2 wound areas some improvement in wound surfaces. Husband has surgery in 2 weeks we will put her out 3 weeks. Any of the advanced treatment options that I can think of that would be eligible for this wound would also cause her to have to come in weekly. The risk that the patient is just too high 9/21. Using Santyl to the 2 wound areas. Both of these are somewhat better although the medial mid tibia area still has exposed muscle. Lateral ankle requiring debridement. Using Santyl 10/12; using Santyl to the 2 wound areas. One on the right lateral ankle and the other in the medial calf which still has exposed muscle. Both areas have come down in size and have better looking surfaces. She has made nice progress with santyl 11/2; we have been using Santyl to the 2 wound areas. Right lateral ankle and the other in the mid tibia area the medial part of this still has exposed muscle.  11/23/2019 on evaluation today patient appears to be doing about the same really with regard to her wounds. She is actually not very pleased with how things seem to be progressing at this point she tells me that she really has not noted much improvement unfortunately. With that being said there is no signs of active infection at this time. There is some slough buildup noted at this time which again along with some dry skin around the edges of the wound I think would benefit her to try to debride some of this away. Fortunately her pain is doing fairly well. She still has exposed muscle in the right medial/tibial area. 12/14; TELEHEALTH; she was changed to [facility] to the right calf wound and right lateral ankle wound when she was here last time. Unfortunately since then she had a fall with a pelvic fracture and a fracture of her wrist. She was apparently hospitalized for 5 days. I have not looked at her discharge summary. She apparently came out of the  hospital with a blister on her right heel. She was seen today via telehealth by myself and our case manager. The patient and her husband were present. She has been using Hydrofera Blue at our direction from the last time she was in the clinic. There is been no major improvement in fact the areas appear deeper and with a less viable surface 01/04/2020; TELEHEALTH; the patient was seen today in accompaniment of her husband and our nurse. She has 3 open areas 1 on the right medial mid tibia, one on the right lateral ankle and a large eschared pressure area on the right heel. We have been using Iodoflex to the 2 original wounds. 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 It is simply too perilous for her to be seen in any other way 1/26; TELEHEALTH; the patient was seen today in accompaniment of her husband and our RN one of our nurses. She still has the 3 open areas 1 on the right medial mid tibia which is the remanent of a more extensive wound in this area, one on the right lateral ankle and a large eschared pressure area on the right heel. We have been using Iodoflex to the 2 original wounds and a bed at 9 application to the eschared area on the heel 2/15; the first time we have seen this patient and then several months out of concern for the pandemic. She had a large horizontal wound in the mid tibia. Only the medial aspect of this is still open. Area on the lateral ankle is just about closed. She had a new pressure ulcer on the right heel which I have removed some of the eschar. We have been using Iodoflex which I will continue. The area in the mid tibia has a round circle in the middle of exposed muscle. I think we would have to use an advanced treatment product to stimulate granulation over this area. We will run this through her insurance. She is not eligible for plastic surgery for many reasons 3/1; TELEHEALTH. The patient was seen today by telehealth. 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 We have been using Iodoflex to the wound areas which include the right anterior mid tibia, right lateral ankle and the right heel. All of these were reviewed 3/8; the patient's wound just above the right ankle is closed. She still has a contracting black eschar  on the heel where she had a pressure ulcer. The medial part of her original wound on the mid tibia has exposed muscle. We have made really made no progress in this area although we have managed to get a lot of the original wound in this area to close. We used Apligraf #1 today 3/22; the patient's ankle wound remains closed. She still has a contracting black eschar on the heel although it seems to have less surface area. It still not clear whether there is any depth here. We used Apligraf #2 today in an attempt to get granulation over the exposed muscle and what is left of her mid tibia area. 4/5; everything is closed except the medial aspect of the mid tibia wound, as well as the pressure injury on the right heel. She has been using Betadine to the right heel which has been gradually contracting. We used Apligraf #3 today. 4/19; Apligraf #4. Still a pressure area on the right heel she has been using Betadine superficial excoriated areas she has been applying salicylic acid to on the anterior leg and the thick horn on the leg just above her wound area 5/3; Apligraf #5. Unfortunately she came in today with a reopening of the area on the right lateral lower leg. Not much change in the wound we have been treating in the mid calf. Quite a bit of edema surrounding this wound. She reports that she was up on her feet quite a bit. The area on the right heel is separating she has been using Betadine She comes in with a new wound on the left lateral malleolus which is very disappointing this is been open since last  week Electronic Signature(s) Signed: 05/03/2020 1:05:55 PM By: Linton Ham MD Entered By: Linton Ham on 05/03/2020 13:04:04 -------------------------------------------------------------------------------- Physical Exam Details Patient Name: Date of Service: [name] [name], [name] 05/02/2020 11:00 A M Medical Record Number: [id number] Patient Account Number: [id number] Date of Birth/Sex: Treating RN: 18-Jun-1951 (69 y.o. [name] Primary Care Provider: [name], [place] RY Other Clinician: Referring Provider: Treating Provider/Extender: [name], MA RY Weeks in Treatment: 585 Constitutional Patient is hypertensive.. Pulse regular and within target range for patient.Marland Kitchen Respirations regular, non-labored and within target range.. Temperature is normal and within the target range for the patient.Marland Kitchen Appears in no distress. Notes Wound exam Midportion of the tibia is about the same. Significant undermining area. There is still exposed muscle here. No real granulation over this area so from that point of view of the Apligraf is not been effective. She has a reopening on the right medial ankle. Still an area that is separating on the right heel this was clearly a pressure ulcer. Localized swelling above the area on the mid calf I think this was probably a wrap issue. I do not think there is any infection here Electronic Signature(s) Signed: 05/03/2020 1:05:55 PM By: Linton Ham MD Entered By: Linton Ham on 05/03/2020 13:01:23 -------------------------------------------------------------------------------- Physician Orders Details Patient Name: Date of Service: [name] [name], [name] 05/02/2020 11:00 A M Medical Record Number: [id number] Patient Account Number: [id number] Date of Birth/Sex: Treating RN: Aug 10, 1951 (69 y.o. 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 - MD visit Return A Nurse Visit: - 1 week for rewrap Dressing Change Frequency Do not change entire dressing for one week. - right leg/heel Wound #20 Left,Lateral Malleolus Change dressing every day. Skin Barriers/Peri-Wound Care Moisturizing lotion - both legs Wound Cleansing May shower with protection. Wound #20 Left,Lateral Malleolus May shower and wash wound with soap and water. Primary Wound Dressing Wound #16 Right Calcaneus Iodoflex Wound #19 Right,Lateral Malleolus pplication - Apligraf #5 Skin Substitute A Wound #5 Right,Medial Lower Leg pplication - Apligraf #5 Skin Substitute A Wound #20 Left,Lateral Malleolus Santyl Ointment Secondary Dressing Wound #16 Right Calcaneus Dry Gauze Heel Cup Wound #19 Right,Lateral Malleolus daptic Dressing - secure with steri strips A Dry Gauze Wound #5 Right,Medial Lower Leg daptic Dressing - secure with steri strips A Dry Gauze Wound #20 Left,Lateral Malleolus Foam Border Edema Control 3 Layer Compression System - Right Lower Extremity Avoid standing for long periods of time Elevate legs to the level of the heart or above for 30 minutes daily and/or when sitting, a frequency of: - throughout the day Off-Loading Turn and reposition every 2 hours Other: - float heels off of bed/chair with pillow under calves Electronic Signature(s) Signed: 05/02/2020 5:27:06 PM By:  Levan Hurst RN, BSN Signed: 05/02/2020 5:33:20 PM By: Linton Ham MD Entered By: Levan Hurst on 05/02/2020 11:48:52 -------------------------------------------------------------------------------- Problem List Details Patient Name: Date of Service: [name] [name], [name] 05/02/2020 11:00 A M Medical Record Number: [id number] Patient Account Number: [id number] Date of Birth/Sex: Treating RN: April 01, 1951 (69 y.o. 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 02/24/2015 No Yes I87.331 Chronic venous hypertension (idiopathic) with ulcer and inflammation of right 10/04/2016 No Yes lower extremity L89.616 Pressure-induced deep tissue damage of right heel 12/14/2019 No Yes Inactive Problems ICD-10 Code Description Active Date Inactive Date 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/19/2016 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 05/02/2020  11:00 A M Medical Record Number: [id number] Patient Account Number: [id number] Date of Birth/Sex: Treating RN: Apr 30, 1951 (69 y.o. [name] Primary Care Provider: [name], [place] RY Other Clinician: Referring Provider: Treating Provider/Extender: [name], MA RY Weeks in Treatment: 423 Subjective History of Present Illness (HPI) this is a patient who initially came to [place] for wounds on the medial malleoli bilaterally as well as her upper medial lower extremities bilaterally. These  wounds eventually healed with assistance of Apligraf's bilaterally. While this was occurring she developed the current wound which opened into a fairly substantial wound on the right lower extremity. These are mostly secondary to venous stasis physiology however the patient also has underlying scleroderma, pulmonary hypertension. The wound has been making good progress lately with the Hydrofera Blue-based dressing. 03/17/2015; patient had a arterial evaluation a year ago. Her right ABI was 0.86 left was 1.0. T brachial index was 0.41 on the right 0.45 on the left. Her oe bilateral common femoral artery waveforms were triphasic. Her white popliteal posterior tibial artery and anterior tibial artery waveforms were monophasic with good amplitude. Luteal artery waveforms were biphasic it was felt that her bilateral great toe pressures are of normal although adequate for tissue healing. 03/24/2015. The condition of this wound is not really improved that. He was covered with as fibrinous surface slough and eschar. This underwent an aggressive debridement with both a curette and scalpel. I still cannot really get down to what I can would consider to be a viable surface. There is no evidence of infection. Previous workup for ischemia roughly a year ago was negative nevertheless I think that continues to be a concern 04/07/15. The patient arrived for application of second Apligraf. Once again the surface of this  wound is certainly less viable than I would like for an advanced treatment option. An aggressive debridement was done. She developed some arteriolar bleeding which required that along pressure and silver alginate. 04/21/15: change in the condition of this wound. Once again it is covered in a gelatinous surface slough. After debridement today surface of the wound looks somewhat better but now a heeling surface 05/05/15 Apligraf #4 applied.Still a lot of slough on this wound. 05/19/15 Apligraf #5 applied. Still a lot of slough on this wound I did not aggressively remove this 06/02/15; continued copious amounts of surface slough. This debridement fairly easily. 06/08/2015 -- the last time she had a venous duplex study done was over 3 years ago and the surgery was prior to that. I have recommended that she sees Dr. early for a another opinion regarding a repeat venous duplex and possibly more endovenous ablation of vein stripping of micro-phlebotomies. 06/16/15; wound has a gelatinous surface eschar that the debridement fairly easily to a point. I don't disagree with the venous workup and perhaps even arterial re-evaluation. She is on prednisone 5 mg and continue his medications for her pulmonary artery hypertension I am not sure if the latter have any wound care healing issues I would need to investigate this. 06/23/15 continues with a gelatinous surface eschar with of fibrinous underlying. What I can see of this wound does not look unhealthy however I just can't get this material which I think is 2 different layers off. Empiric culture done 06/30/15; unfortunately not a lot of change in this wound. A gelatinous surface eschar is easily removed however it has a tight fibrinous surface underneath the. 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 The wound comes back and basically and unchanged state the. She has a gelatinous surface that is more easily removed however there is a tightly fibrinous  surface underneath the. There is no evidence of infection. She has a vascular follow-up next month. I would have to inject her in order to do a more aggressive debridement of this area 07/21/15 the wound is roughly in the same state albeit the debridement is done with greater ease. There is less  of the fibrinous eschar underneath the. There is no evidence of infection. She has follow-up with vascular surgery next week. No evidence of surrounding infection. Her original distal wounds healed while this one formed. 07/28/15; wound is easier to debride. No wound erythema. She is seeing Dr. [name] next week. 08/18/15 Has been to [place] for repeat ablation. Have been using medihoney pad with some improvement 08/25/15; absolutely no change in the condition of this wound in either its overall size or surface condition in many months now. At one point I had this down to [place] healthier surface I think with [facility] however this did not actually progress towards closure. Do not believe that the wound has actually deteriorated in terms of volume at all. We have been using a medihoney pad which allows easier debridement of the gelatinous eschar but again no overall actual improvement. the patient is going towards an ablation with pain and vascular which I think is scheduled for next week. The only other investigation that I could first see would be a biopsy. She does have underlying scleroderma 09/02/15 eschar is much easier to debridement however the base of this does not look particularly vibrant. We changed to Iodoflex. The patient had her ablation earlier this week 09/09/15 again the debridement over the base of this wound is easier and the base of the wound looks considerably better. We will continue the Iodoflex. Dr. Tawni Millers has expressed his satisfaction with the result of her ablation 09/15/15 once again the wound is relatively free of surface eschar. No debridement was done today. It has a pale-looking  base to it. although this is not as deep as it once was it seems to be expanding especially inferiorly. She has had recent venous ablation but this is no closer to healing.I've gone ahead and done a punch biopsy this from the inferior part of the wound close to normal skin 09/22/15: the wound is relatively free of surface eschar. There is some surrounding eschar. I'm not exactly sure at what level the surface is that I am seeing. 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 No evidence of atypical infection, malignancy. 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 No debridement was done 09/29/15; the wound surface is relatively free of surface eschar. There is eschar surrounding the walls of the wound. Aside from the improvement in the amount of surface slough. This wound has not progressed any towards closure. There is not even a surface that looks like there at this is ready. There is no evidence of any infection nor maligancy based on biopsy I did on 9/15. I continued with the Iodoflex however I am looking towards some alternative to try to promote some closure or filling in of this surface. Consider triple layer Oasis. Collagen did not result in adequate control of the surface slough 10/13/15; the patient was in hospital last week with severe anemia. The wound looks somewhat better after debridement although there is widening medially. There is no evidence of infection. 10/20/15; patient's wound on the right lateral lower leg is essentially unchanged. This underwent a light surface debridement and in general the debridement is easier and the surface looks improved. I noted in doing this on the side of the wound what appears to be a piece of calcium deposition. The patient noted that she had previous things on the tips of her fingers. 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  She follows with rheumatology and I have asked them to talk to her about this. In view of that S the nonhealing ulcer may have something to do with calcinosis and also unrelieved Raynaud's disease in this area. I should note that her original wounds on the right and left medial malleolus and the inner aspects of both legs just below the knees did however heal over 10/30/15; the patient's wound on the right lateral lower leg is essentially unchanged. I was able to remove some calcified material from the medial wound edge. 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 Otherwise the wound appears essentially unchanged there is less adherent surface eschar. Some of the calcified material was sent to pathology for analysis 11/17/15. The patient's wound on the right lateral leg is essentially unchanged. Wider Medially. 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 There is some surface slough but I did not debridement this. No evidence of surrounding infection. 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 This will make healing difficult 12/15/15; the wound has less depth surface has a fibrinous slough and calcifications in the wound edge no evidence of infection 12/22/15; the wound definitely has less Fibrinous slough on the base. Calcifications around the wound edges are still evident. Although the wound bed looks healthier it is still pale in appearance. Previous biopsy did not show malignancy 01/04/15; surgical debridement of nonviable slough and subcutaneous tissue the wound cleans up quite nicely but appears to be expanding outward calcifications around the wound edges are still  there. 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 She is to see her rheumatologist I'll see if he has any opinions on this. My punch biopsy done in September did not show calcifications although these are clearly evident. 01/19/16 light selective debridement of nonviable surface slough. There is epithelialization medially. This gives me reason for cautious optimism. She has been to see her rheumatologist, there is nothing that can be done for this type of soft tissue calcification associated with scleroderma 02/02/16 no debridement although there is a light surface slough. She has 2 peninsulas of skin 1 inferiorly and one medially. We continue to make a slight and slow but definite progressive here 02/16/16; light surface debridement with more attention to the circumference of the wound bed where the fibrinous eschar is more prevalent. No calcifications detected. She seems to have done nicely with the [facility] with some epithelialization and some improvement in the overall wound volume. 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 Continued attention to the circumference of the wound where the fibrinous eschar in calcinosis or prevalent. No calcifications were detected. She has continued improvement with Hydrofera Blue. The wound is no longer as deep 03/15/16 surgical debridement done to remove surface escha Especially around the circumference of the wound where there is nonviable subcutaneous tissue. In spite of this there is considerable improvement in the overall dimensions and depth of the wound. Islands of epithelialization are seen especially medially inferiorly and superiorly to a lesser extent. She is using Hydrofera Blue at home 03/29/16; surgical debridement done to remove surface eschar  and nonviable subcutaneous tissue. This  cleans up quite nicely mention slightly larger in terms of length and width however depth is less 04/12/16; continued gradual improvement in terms of depth and the condition of the wound base. Debridement is done. Continuing long standing Hydrofera Blue at home with Kerlix Coban wraps 04/26/16; continued gradual improvement in terms of depth and management as well as condition of the wound base. Surgical debridement done she continues with Hydrofera Blue. 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 She initially came to this clinic venous insufficiency ulcers which have long since healed 05/17/16 continued improvement in terms of the depth and measurements of this wound although she has a tightly adherent fibrinous slough each time. We've been continued with long standing Hydrofera Blue which seems to done as well for this wound is many advanced treatment options. 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 She also has chronic venous insufficiency. She has an irritation on her lateral right ankle secondary to our wraps 05/10/16; wound appears to be smaller especially on the medial aspect and especially in the width. Wound was debridement surface looks better. She is also been in the hospital apparently with anemia again she tells me she had an endoscopy. Since she got home after 3 days which I believe was sometime last week she has had an irritated painful area on the right lateral ankle surrounding the lateral malleolus 05/31/16; much more adherent surface slough today then recently although I don't think the dimensions of changed that much. A more aggressive debridement is required. The irritated area over her medial malleolus is more pruritic and painful and I don't think represents cellulitis 06/14/16; no major change in her wound dimensions however there is more tightly adherent surface slough which is disappointing. As well as  she appears to have a new small area medially. Furthermore an irritated uncomfortable area on the lateral aspect of her right foot just below the lateral malleolus. 06/21/16; I'm seeing the patient back in follow-up for the new areas under her major wound on the right anterior leg. She has been using Hydrofera Blue to this area probably for several months now and although the dressings seem to be helping for quite a period of time I think things have stagnated lately. She comes in today with a relatively tight adherent surface slough and really no changes in the wound shape or dimension. The 2 small areas she had inferiorly are tiny but still open they seem improved this well. There is no uncontrolled edema and I don't think there is any evidence of cellulitis. 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 Patient has had vascular evaluations both venous and arterial. I have biopsied this area. There is no obvious infection. The worrisome thing today is that she seems to be developing areas of erythema and epithelial damage on the medial aspect of the right foot. Also to a lesser degree inferior to the actual wound itself. Again I see no obvious changes to suggest cellulitis however as this is the only treatable option I will probably give her antibiotics. 07/13/16 no major change in the lady's large anterior right leg wound. Still covered with a very tightly adherent surface slough which is difficult to debridement. There is less erythema around this, culture last week grew pseudomonas I gave her ciprofloxacin. The area on  her lateral right malleolus looks better- 08/02/16 the patient's wounds continued to decline. Her original large anterior right leg wound looks deeper. Still adherent surface slough that is difficult to debridement. She has a small area just below this and a punched-out wound just  below her lateral malleolus. In the meantime she is been in hospital with apparently an upper GI bleed on Plavix and aspirin. She is now just on Plavix she received 3 units of packed cells 08/23/16; since I last saw this 3 weeks ago, the open large area on her right leg looks about the same syrup. She has a small satellite lesion just underneath this. The area on her medial right ankle is now a deep necrotic wound. I attempted to debridement this however there is just too much pain. 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 She follows with vascular surgery and is scheduled for an angiogram in early September 09/06/16; the patient is going for an arteriogram tomorrow. Her original large wound on the right calf is about the same the satellite lesion underneath it is about the same however the area on her medial ankle is now deeper with exposed tendon. I am no longer attempting to debride these wounds 09/20/16; the patient has undergone a right femoral endarterectomy and Dacron patch angioplasty. This seems to have helped the flow in her right leg. 10/04/16; Arrived today for aggressive debridement of the wounds on her right calf the original wound the one beneath it and a difficult area over her right lateral leg just above the lateral malleolus. 10/25/16; her 3 open wounds are about the same in terms of dimensions however the surface appears a lot healthier post debridement. Using Iodoflex 10/18/16 we have been using Iodoflex to her wounds which she tolerated with some difficulty. 10/11/16; has been using Santyl for a period of time with some improvement although again very adherent surface slough would prevent any attempted healing this. She has a original wound on the left calf, the satellite underneath that and the most recent wound on the right medial ankle. She has completed revascularization by Dr. early and has had venous  ablation earlier. Want to go back to Iodoflex to see if week and get a healthier surface to this wound bed failing this I think she'll need to be taken to the OR and I am prepared to call Dr. Marla Roe to discuss this. She is obviously not a good candidate for general anesthesia however.; 11/08/16; I put her on Iodoflex last time to see if I can get the wound bed any healthier and unfortunately today still had tremendous surface slough. 11/15/16; 4 weeks' worth of Iodoflex with not much improvement. Debridement on the major wounds on her left anterior leg is easier however this does not maintain from week to week. The punched-out area on her medial right ankle 11/29/16; I attempted to change the patient last visit to [facility] however she states this burned and was very uncomfortable therefore we gave her permission to go back to the eye out of complex which she already had at home. Also she noted a lot of pain and swelling on the lateral aspect of her leg before she traveled to [facility] for the holidays, I called her in doxycycline over the phone. This seems to have helped 12/06/16; Wounds unchanged by in large. Using Iodoflex 12/13/16; her wounds today actually looks somewhat better. The area on the  right lateral lower leg has reasonably healthy-looking granulation and perhaps as actually filled and a bit. Debridement of the 2 wounds on the medial calf is easier and post debridement appears to have a healthier base. We have referred her to Dr. Migdalia Dk for consideration of operative debridement 12/20/16; we have a quick note from Dr. Merri Ray who feels that the patient needs to be referred to an academic center/plastic surgeon. This is due to the complexity of the patient's medical issues as it applies to anything in the OR. We have been using Iodoflex 12/27/16; in general the wounds on the right leg are better in terms of the difficult to remove surface slough. She has  been using Iodoflex. She is approved for Apligraf which I anticipated ordering in the next week or 2 when we get a better-looking surface 01/04/16 the deep wounds on the right leg generally look better. Both of them are debrided further surface slough. The area on the lateral right leg was not debrided. She is approved for Apligraf I think I'll probably order this either next week or the week after depending on the surface of the wounds superiorly. We have been using Iodoflex which will continue until then 01/11/16; the deep wounds on the right leg again have a surface slough that requires debridement. I've not been able to get the wound bed on either one of these wounds down to what would be acceptable for an advanced skin stab-like Apligraf. The area on the medial leg has been improving. We have been using hot Iodoflex to all wounds which seems to do the best at at least limiting the nonviable surface 01/24/17; we have continued Iodoflex and all her wound areas. Her debridement Gen. he is easier and the surface underneath this looks viable. Nevertheless these are large area wounds with exposed muscle at least on the anterior parts. We have ordered Apligraf's for 2 weeks from now. The patient will be away next week 02/07/17; the patient was close to have first Apligraf today however we did not order one. I therefore replaced her Iodoflex. She essentially has 3 large punched- out areas on her right anterior leg and right medial ankle. 02/11/17; Apligraf #1 02/25/17; Apligraf #2. In general some improvement in the right medial ankle and right anterior leg wounds. The larger wound medially perhaps some better 03/11/17; Apligraf #3. In general continued improvement in the right medial ankle and the right anterior leg wounds. 03/25/17 Apligraf #4. In general continued improvement especially on the right medial ankle and the lower 04/08/17; Apligraf #5 in general continued improvement in all wound sites. 04/22/17;  post Apligraf #5 her wound beds continued to look a lot better all of them up to the surface of the surrounding skin. Had a caramel-colored slough that I did not debrided in case there is residual Apligraf effect. The wounds are as good as I have seen these looking quite some time. 04/29/17; we applied Agar and last week after completing her Apligraf. Wounds look as though they've contracted somewhat although they have a nonviable surface which was problematic in the past. Apparently she has been approved for further Apligraf's. We applied Iodosorb today after debridement. 05/06/17; we're fortunate enough today to be able to apply additional Apligraf approved by her insurance. In general all of her wounds look better Apligraf #6 05/24/17; Apligraf #7 continued improvement in all wounds o3 06/10/17 Apligraf #8. Continued improvement in the surface of all wounds. Not much of an improvement in dimensions as I  might a follow 06/24/17 Apligraf #9 continued general improvement although not as much change in the wound areas I might of like. She has a new open area on the right anterior lateral ankle very small and superficial. She also has a necrotic wound on the tip of her right index finger probably secondary to severe uncontrolled Raynaud's phenomenon. She is already on sildenafil and already seen her rheumatologist who gave her Keflex. 07/08/17; Apligraf #10. Generalized improvement although she has a small additional wound just medial to the major wound area. 07/22/17; after discussion we decided not to reorder any further Apligraf's although there is been considerable improvement with these it hasn't been recently. The major wound anteriorly looks better. Smaller wounds beneath this and the more recent one and laterally look about the same. The area on the right lateral lower leg looks about the same 07/29/17 this is a patient who is exceedingly complex. She has advanced scleroderma, crest syndrome  including calcinosis, PAD status post revascularization, chronic venous insufficiency status post ablations. She initially presented to this clinic with wounds on her bilateral lower legs however these closed. More recently we have been dealing with a large open area superiorly on the right anterior leg, a smaller wound underneath this and then another one on the right medial lower extremity. These improve significantly with 10 Apligraf applications. Over the last 2-3 weeks we are making good progress with Hydrofera Blue and these seem to be making progress towards closure 08/19/17; wounds continued to make good improvement with Hydrofera Blue and episodic aggressive debridements 08/26/17; still using Hydrofera Blue. Good improvements 09/09/17; using Hydrofera Blue continued improvement. Area on the lateral part of her right leg has only a small remaining open area. The small inferior satellite region is for all intents and purposes closed. Her major wound also is come in in terms of depth and has advancing epithelialization. 09/16/17; using Hydrofera Blue with continued improvement. The smaller satellite wound. We've closed out today along with a new were satellite wound medially. The area on her medial ankle is still open and her major wound is still open but making improvement. All using Hydrofera Blue. Currently 09/30/17; using Hydrofera Blue. Still a small open area on the lateral right ankle area and her original major wound seems to be making gradual and steady improvement. 10/14/17; still using Hydrofera Blue. Still too small open areas on the right lateral ankle. Her original major wound is horizontal and linear. The most problematic area paradoxically seems to be the area to the medial area wears I thought it would be the lateral. 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 10/28/17; still using Hydrofera Blue. Right lateral ankle has a very small  open area superiorly on the most lateral part of the wound. Her original open wound has 2 open areas now separated by normal skin and we've redefined this. 11/11/17; still using Hydrofera Blue area and right lateral ankle continues to have a small open area on mostly the lateral part of the wound. Her original wound has 2 small open areas now separated by a considerable amount of normal skin 11/28/17; the patient called in slightly before Thanksgiving to report pain and erythema above the wound on the right leg. In the past this is responded well to treatment for cellulitis and I gave her over the phone doxycycline. She stated this resulted in fairly abrupt improvement. We have been using Hydrofera Blue for a prolonged period of time  to the larger wound anteriorly into the remaining wound on her right right lateral ankle. The latter is just about closed with only a small linear area and the bottom of the [place]. 12/02/17; use endoform and left the dressing on since last visit. There is no tenderness and no evidence of infection. 12/16/17; patient has been using Endoform but not making much progress. The 2 punched out open areas anteriorly which were the reminiscence of her major wound appear deeper. The area on the lateral aspect of her right calf also appears deeper. Also she has a puzzling tender swelling above her wound on the right leg. This seems larger than last time. Just above her wounds there appears to be some fluctuance in this area it is not erythematous and there is no crepitus 12/30/17; patient has been using Endoform up until last week we used Hydrofera Blue. Ultrasound of the swelling above her 2 major wounds last time was negative for a fluid collection. I gave her cefaclor for the erythema and tenderness in this area which seems better. Unfortunately both punched out areas anteriorly and the area on her right medial lower leg appear deeper. In fact the lateral of the wounds anteriorly  actually looks as though it has exposed tendon and/or muscle sheath. She is not systemically unwell. She is complaining of vaginitis type symptoms presumably Candida from her antibiotics. 01/06/18; we're using santyl. she has 2 punched out areas anteriorly which were initially part of a large wound. Unfortunately medially this is now open to tendon/muscle. All the wounds have the same adherent very difficult to debride surface. 01/20/18; 2 week follow-up using Santyl. She has the 2 punched out areas anteriorly which were initially part of her large surface wound there. Medially this still has exposed muscle. All of these have the same tightly adherent necrotic surface which requires debridement. PuraPly was not accepted by the patient's insurance however her insurance I think it changed therefore we are going to run Apligraf to gain 02/03/18; the patient has been using Santyl. The wound on the right lateral ankle looks improved and the 2 areas anteriorly on the right leg looks about the same. The medial one has exposed muscle. The lateral 1 requiresdebridement. We use PuraPly today for the first week 02/10/18; PuraPly #2. The patient has 3 wounds. The area on the right lateral ankle, 2 areas anteriorly that were part of her original large wound in this area the medial one has exposed muscle. All of the wounds were lightly debrided with a number 3 curet. PuraPly #2 applied the lateral wound on the calf and the right lateral ankle look better. 02/17/18; PuraPly #3. Patient has 3 wounds. The area on the right lateral ankle in 2 areas internally that were part of her original large wound. The lateral area has exposed muscle. She arrived with some complaints of pain around the right ankle. 02/24/18; PuraPly #4; not much change in any of the 3 wound areas. Right lateral ankle, right lateral calf. Both of these required debridement with a #3 curet. She tolerates this marginally. The area on the medial leg still has  exposed muscle. Not much change in dimensions 03/03/18 PuraPly #5. The area on the medial ankle actually looks better however the 2 separate areas that were original parts of the larger right anterior leg wound look as though they're attempting to coalesce. 03/10/18; PuraPly #6. The area on the medial ankle actually continues to look and measures smaller however the 2 separate areas that  were part of the original large wound on the right anterior leg have now coalesced. There hasn't been much improvement here. The lateral area actually has underlying exposed muscle 03/17/18-she is here in follow-up evaluation for ulcerations to the right lower extremity. She is voicing no complaints or concerns. She tolerated debridement. Puraply#7 placed 03/24/18; difficult right lower extremity ulcerations. PuraPly #8 place. She is been approved for [employer]. She did very well with Apligraf today however she is apparently reached her "lifetime max" 03/31/18; marginal improvement with PuraPly although her wounds looked as good as they have in several weeks today. Used TheraSkins #1 04/14/18 TheraSkin #2 today 04/28/18 TheraSkins #3. Wound slightly improved 05/12/18; TheraSkin skin #4. Wound response has been variable. 05/27/18 TheraSkin #5. Generally improvement in all wound areas. I've also put her in 3 layer compression to help with the severe venous hypertension 06/09/18; patient is done quite well with the TheraSkins unfortunately we have no further applications. I also put her in 3 layer compression last week and that really seems to of helped. 06/16/18; we have been using silver collagen. Wounds are smaller. Still be open area to the muscle layer of her calf however even that is contracted somewhat. She tells me that at night sometimes she has pain on the right lateral calf at the site of her lower wound. Notable that I put her into 3 layer compression about 3 weeks ago. She states that she dangles her leg over the bed  that makes it feel better but she does not describe claudication during the day ooShe is going to call her secondary insurance to see if they will continue to cover advanced treatment products I have reviewed her arterial studies from 01/22/17; this showed an ABI in the right of 1 and on the left noncompressible. TBI on the right at 0.30 on the left at 0.34. It is therefore possible she has significant PAD with medial calcification falsely elevating her ABI into the normal range. I'll need to be careful about asking her about this next week it's possible the 3 layer compression is too much 06/23/18; was able to reapply TheraSkin 1 today. Edema control is good and she is not complaining of pain no claudication 07/07/18;no major change. New wound which was apparently a taper removal injury today in our clinic between her 2 wounds on the right calf TheraSkins #2 07/14/18; I think there is some improvement in the right lateral ankle and the medial part of her wound. There is still exposed muscle medially. 07/28/18; two-week follow-up. TheraSkins#3. Unfortunately no major change. She is not a candidate I don't think for skin grafting due to severe venous hypertension associated with her scleroderma and pulmonary hypertension 08/11/18 Patient is here today for her [employer] application #09 (#5 of the second set). She seems to be doing well and in the base of the wound appears to show some progress at this point. This is the last approved [employer] of the second set. 08/25/18; she has completed TheraSkin. There has been some improvement on the right lateral calf wound as well as the anterior leg wounds. The open area to muscle medially on the anterior leg wound is smaller. I'm going to transition her back to [facility] under Kerlix Coban change every second day. She reports that she had some calcification removed from her right upper arm. We have had previous problems with calcifications in her wounds on her  legs but that has not happened recently 09/08/18;using Hydrofera Blue on both her wound  areas. Wounds seem to of contracted nicely. She uses Kerlix Coban wrap and changes at home herself 09/22/2018; using Hydrofera Blue on both her wound areas. Dimensions seem to have come down somewhat. There is certainly less depth in the medial part of the mid tibia wound and I do not think there is any exposed muscle at this point. 10/06/2018; 2-week follow-up. Using [facility] on her wound areas. Dimensions have come down nicely both on the right lateral ankle area in the right mid tibial area. She has no new complaints 10/20/2018; 2-week follow-up. She is using Hydrofera Blue. Not much change from the last time she was here. The area on the lateral ankle has less depth although it has raised edges on one side. I attempted to remove as much of the raised edge as I could without creating more additional wounds. The area on the right anterior mid tibia area looks the same. 11/03/2018; 2-week follow-up she is using Hydrofera Blue. On the right anterior leg she now has 2 wounds separated by a large area of normal skin. The area on the medial part still has I think exposed muscle although this area itself is a lot smaller. The area laterally has some depth. Both areas with necrotic debris. ooThe area on her right lateral ankle has come in nicely 11/17/2018; patient continues to use Hydrofera Blue. We have been increasing separation of the 2 wounds anteriorly which were at one point joint the area on the right lateral calf continues to have I think some improvement in depth. 12/08/2018; patient continues to use Hydrofera Blue. There is some improvement in the area on the right lateral calf. The 2 areas that were initially part of the original large wound in the mid right tibia are probably about the same. In fact the medial area is probably somewhat larger. We will run puraply through the patient's  insurance 12/22/2018; she has been using Hydrofera Blue. We have small wounds on the right lateral calf and 2 small areas that were initially part of a large wound in the right mid tibia. We applied pure apply #1 today. 12/29/2018; we applied puraply #2. Her wounds look somewhat better especially on the right lateral calf and the lateral part of her original wound in the mid tibial area. 01/05/2019; perhaps slightly improved in terms of wound bed condition but certainly not as much improvement as I might of liked. Puraply #3 1/13: we did not have a correct sized puraply to apply. wounds more pinched out looking, I increased her compression to 3 layer last week to help with significant multilevel venous hypertension. Since then I've reviewed her arterial status. She has a right femoral endarterectomy and a distal left SFA stent. She was being followed by Dr. [name] for a period however she does not appear to have seen him in 3 years. I will set up an appointment. 1/20. The patient has an additional wound on the right lateral calf between the distal wound and proximal wounds. We did not have Puraply last week. Still does not have a follow-up with Dr. early 1/27: Follow-up with Dr. early has been arranged apparently with follow-up noninvasive studies. Wounds are measuring roughly the same although they certainly look smaller 2/3; the patient had non invasive studies. Her ABIs on the right were 0.83 and on the left 1.02 however there was no great toe pressure bilaterally. Also worrisome monophasic waveforms at the PTA and dorsalis pedis. We are still using Puraply. We have had some improvement  in all of the wounds especially the lateral part of the mid tibial area. 2/10; sees Dr. early of vein and vascular re-arterial studies next week. Puraply reapplied today. 2/17; Dr. early of vein and vascular his appointment is tomorrow. Puraply reapplied after debridement of all wounds 2/24; the patient saw Dr.  early I reviewed his note. sHe noted the previous right femoral endarterectomy with a Dacron patch. He also noted the normal ABI and the monophasic waveforms suggesting tibial disease. Overall he did not feel that she had any evidence of arterial insufficiency that would impair her wound healing. He did note her venous disease as well. He suggested PRN follow-up. 3/2; I had the last puraply applied today. The original wounds over the mid tibia area are improved where is the area on the right lower leg is not 3/9; wounds are smaller especially in the right mid tibia perhaps slightly in the right lateral calf. We finished with puraply and went to endoform today 3/23; the patient arrives after 2 weeks. She has been using endoform. I think all of her wounds look slightly better which includes the area on the right lateral calf just above the right lateral malleolus and the 2 in the right mid tibia which were initially part of the same wound. 4/27; TELEHEALTH visit; the patient was seen for telehealth visit today with her consent in the middle of the worldwide epidemic. Since she was last here she called in for antibiotics with pain and tenderness around the area on the right medial ankle. I gave her empiric doxycycline. She states this feels better. She is using endoform on both of these areas 5/11 TELEHEALTH; the patient was seen for telehealth visit today. She was accompanied at home by her husband. She has severe pulmonary hypertension accompanied scleroderma and in the face of the Covid epidemic cannot be safely brought into our clinic. We have been using endoform on her wound areas. There are essentially 3 wound areas now in the left mid tibia now 2 open areas that it one point were connected and one on the right lateral ankle just above the malleolus. 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 She cannot come to clinic. The wounds were reviewed today via telehealth. She has severe chronic venous hypertension which I think is centrally mediated. She has wounds on her right anterior tibia and right lateral ankle area. These are chronic. She has been using endoform. 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 We have been following her from telehealth. She has severe chronic venous hypertension which may be mostly centrally mediated secondary to right heart heart failure. She has wounds on her right anterior tibia and right lateral ankle these are chronic we have been using endoform 6/22; TELEHEALTH; this patient was seen today via telehealth. 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 Since the last time we had contact with this patient she developed some pain and erythema around the wound on her right lateral malleolus/ankle and we put in antibiotics for her. This is resulted in good improvement with resolution of the erythema and tenderness. I changed her to silver alginate  last time. We had been using endoform for an extended period of time 7/13; TELEHEALTH; this patient was seen today via telehealth. 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 She would continue to be at a prohibitive risk to be brought into our clinic unless this was absolutely necessary. These visits have been done with her approval as well as her husband. We have been using silver alginate to the areas on the right mid tibia and right lateral lower leg. 7/27 TELEHEALTH; patient was seen along with her husband today via telehealth. 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 We changed her to sample last visit. She has 2 areas a chronic wound on her right mid tibia and one just above her ankle. These were not the original wounds when she came into this clinic but she developed them during treatment 8/17; she comes in for her first face-to-face visit in a long period. She has a remaining area just medial to the right tibia which is the last open part of her large wound across this area. She also has an area on the right lateral lower leg. We prescribed Santyl last telehealth visit but they were concerned that this was making a deeper so they put silver alginate on it last week. Her husband changes the dressings. 8/31; using Santyl to the 2 wound areas some improvement in wound surfaces. Husband has surgery in 2 weeks we will put her out 3 weeks. Any of the advanced treatment options that I can think of that would be eligible for this wound would also cause her to have to come in weekly. The risk that the patient is just too high 9/21. Using Santyl to the 2 wound areas. Both of these are somewhat better although the medial mid tibia area still has exposed muscle. Lateral ankle requiring debridement. Using Santyl 10/12; using Santyl to the 2 wound areas. One on the right lateral ankle and the other in the medial calf which still has exposed muscle. Both areas have come down in size and have better looking surfaces. She has made nice progress with santyl 11/2; we have been using Santyl to the 2 wound areas. Right lateral ankle and the other in the mid tibia area the medial part of this still has exposed muscle. 11/23/2019 on evaluation today patient appears to be doing about the same really with regard to her wounds. She is actually not very pleased with how things seem to be progressing at this point she tells me that she really has not noted much improvement unfortunately. With that being said there is no  signs of active infection at this time. There is some slough buildup noted at this time which again along with some dry skin around the edges of the wound I think would benefit her to try to debride some of this away. Fortunately her pain is doing fairly well. She still has exposed muscle in the right medial/tibial area. 12/14; TELEHEALTH; she was changed to [facility] to the right calf wound and right lateral ankle wound when she was here last time. Unfortunately since then she had a fall with a pelvic fracture and a fracture of her wrist. She was apparently hospitalized for 5 days. I have not looked at her discharge summary. She apparently came out of the hospital with a blister on her right heel. She was seen today via telehealth by  myself and our case Freight forwarder. The patient and her husband were present. She has been using Hydrofera Blue at our direction from the last time she was in the clinic. There is been no major improvement in fact the areas appear deeper and with a less viable surface 01/04/2020; TELEHEALTH; the patient was seen today in accompaniment of her husband and our nurse. She has 3 open areas 1 on the right medial mid tibia, one on the right lateral ankle and a large eschared pressure area on the right heel. We have been using Iodoflex to the 2 original wounds. 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 It is simply too perilous for her to be seen in any other way 1/26; TELEHEALTH; the patient was seen today in accompaniment of her husband and our RN one of our nurses. She still has the 3 open areas 1 on the right medial mid tibia which is the remanent of a more extensive wound in this area, one on the right lateral ankle and a large eschared pressure area on the right heel. We have been using Iodoflex to the 2 original wounds and a bed at 9 application to the eschared area on the heel 2/15; the first time we have seen this patient and then several  months out of concern for the pandemic. She had a large horizontal wound in the mid tibia. Only the medial aspect of this is still open. Area on the lateral ankle is just about closed. She had a new pressure ulcer on the right heel which I have removed some of the eschar. We have been using Iodoflex which I will continue. The area in the mid tibia has a round circle in the middle of exposed muscle. I think we would have to use an advanced treatment product to stimulate granulation over this area. We will run this through her insurance. She is not eligible for plastic surgery for many reasons 3/1; TELEHEALTH. The patient was seen today by telehealth. 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 We have been using Iodoflex to the wound areas which include the right anterior mid tibia, right lateral ankle and the right heel. All of these were reviewed 3/8; the patient's wound just above the right ankle is closed. She still has a contracting black eschar on the heel where she had a pressure ulcer. The medial part of her original wound on the mid tibia has exposed muscle. We have made really made no progress in this area although we have managed to get a lot of the original wound in this area to close. We used Apligraf #1 today 3/22; the patient's ankle wound remains closed. She still has a contracting black eschar on the heel although it seems to have less surface area. It still not clear whether there is any depth here. We used Apligraf #2 today in an attempt to get granulation over the exposed muscle and what is left of her mid tibia area. 4/5; everything is closed except the medial aspect of the mid tibia wound, as well as the pressure injury on the right heel. She has been using Betadine to the right heel which has been gradually contracting. We used Apligraf #3 today. 4/19; Apligraf #4. Still a pressure  area on the right heel she has been using Betadine superficial excoriated areas she has been applying salicylic acid to on the anterior leg and the thick  horn on the leg just above her wound area 5/3; Apligraf #5. Unfortunately she came in today with a reopening of the area on the right lateral lower leg. Not much change in the wound we have been treating in the mid calf. Quite a bit of edema surrounding this wound. She reports that she was up on her feet quite a bit. The area on the right heel is separating she has been using Betadine She comes in with a new wound on the left lateral malleolus which is very disappointing this is been open since last week Objective Constitutional Patient is hypertensive.. Pulse regular and within target range for patient.Marland Kitchen Respirations regular, non-labored and within target range.. Temperature is normal and within the target range for the patient.Marland Kitchen Appears in no distress. Vitals Time Taken: 11:07 AM, Height: 68 in, Weight: 132 lbs, BMI: 20.1, Temperature: 98.3 F, Pulse: 72 bpm, Respiratory Rate: 19 breaths/min, Blood Pressure: 150/40 mmHg. General Notes: Wound exam ooMidportion of the tibia is about the same. Significant undermining area. There is still exposed muscle here. No real granulation over this area so from that point of view of the Apligraf is not been effective. ooShe has a reopening on the right medial ankle. ooStill an area that is separating on the right heel this was clearly a pressure ulcer. ooLocalized swelling above the area on the mid calf I think this was probably a wrap issue. I do not think there is any infection here Integumentary (Hair, Skin) Wound #16 status is Open. Original cause of wound was Pressure Injury. The wound is located on the Right Calcaneus. The wound measures 1.1cm length x 1.6cm width x 0.3cm depth; 1.382cm^2 area and 0.415cm^3 volume. There is no tunneling or undermining noted. There is a small amount of serous  drainage noted. The wound margin is distinct with the outline attached to the wound base. There is small (1-33%) pink granulation within the wound bed. There is a large (67-100%) amount of necrotic tissue within the wound bed including Eschar and Adherent Slough. Wound #17 status is Healed - Epithelialized. Original cause of wound was Gradually Appeared. The wound is located on the Right,Anterior Lower Leg. The wound measures 0cm length x 0cm width x 0cm depth; 0cm^2 area and 0cm^3 volume. Wound #18 status is Healed - Epithelialized. Original cause of wound was Gradually Appeared. The wound is located on the Right,Lateral Lower Leg. The wound measures 0cm length x 0cm width x 0cm depth; 0cm^2 area and 0cm^3 volume. Wound #19 status is Open. Original cause of wound was Gradually Appeared. The wound is located on the Right,Lateral Malleolus. The wound measures 0.6cm length x 0.3cm width x 0.2cm depth; 0.141cm^2 area and 0.028cm^3 volume. There is Fat Layer (Subcutaneous Tissue) Exposed exposed. There is no tunneling or undermining noted. There is a medium amount of serosanguineous drainage noted. The wound margin is distinct with the outline attached to the wound base. There is medium (34-66%) red, pink granulation within the wound bed. There is a medium (34-66%) amount of necrotic tissue within the wound bed including Adherent Slough. Wound #20 status is Open. Original cause of wound was Gradually Appeared. The wound is located on the Left,Lateral Malleolus. The wound measures 0.8cm length x 0.7cm width x 0.2cm depth; 0.44cm^2 area and 0.088cm^3 volume. There is no tunneling or undermining noted. There is a medium amount of serous drainage noted. The wound margin is flat and intact. There is no granulation within the wound bed. There is a large (67-100%) amount  of necrotic tissue within the wound bed including Adherent Slough. Wound #5 status is Open. Original cause of wound was Gradually Appeared.  The wound is located on the Right,Medial Lower Leg. The wound measures 2.5cm length x 2cm width x 0.6cm depth; 3.927cm^2 area and 2.356cm^3 volume. There is Fat Layer (Subcutaneous Tissue) Exposed exposed. There is no tunneling noted, however, there is undermining starting at 11:00 and ending at 1:00 with a maximum distance of 1.1cm. There is a medium amount of purulent drainage noted. The wound margin is well defined and not attached to the wound base. There is no granulation within the wound bed. There is a large (67-100%) amount of necrotic tissue within the wound bed including Adherent Slough. 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 There was a Excisional Skin/Subcutaneous Tissue Debridement with a total area of 0.18 sq cm performed by Ricard Dillon., MD. With the following instrument(s): Curette to remove Viable and Non-Viable tissue/material. Material removed includes Subcutaneous Tissue and Slough and after achieving pain control using Other (Benzocaine 20%). No specimens were taken. A time out was conducted at 11:35, prior to the start of the procedure. A Minimum amount of bleeding was controlled with Pressure. The procedure was tolerated well with a pain level of 0 throughout and a pain level of 0 following the procedure. Post Debridement Measurements: 0.6cm length x 0.3cm width x 0.2cm depth; 0.028cm^3 volume. Character of Wound/Ulcer Post  Debridement is improved. Severity of Tissue Post Debridement is: Fat layer exposed. Post procedure Diagnosis Wound #19: Same as Pre-Procedure Pre-procedure diagnosis of Wound #19 is a Venous Leg Ulcer located on the Right,Lateral Malleolus. A skin graft procedure using a bioengineered skin substitute/cellular or tissue based product was performed by Ricard Dillon., MD. Apligraf was applied and secured with Steri-Strips. 12 sq cm of product was utilized and 10 sq cm was wasted due to wound size. Post Application, adaptic, gauze was applied. A Time Out was conducted at 11:40, prior to the start of the procedure. The procedure was tolerated well with a pain level of 0 throughout and a pain level of 0 following the procedure. Post procedure Diagnosis Wound #19: Same as Pre-Procedure . Pre-procedure diagnosis of Wound #19 is a Venous Leg Ulcer located on the Right,Lateral Malleolus . There was a Three Layer Compression Therapy Procedure by Levan Hurst, RN. Post procedure Diagnosis Wound #19: Same as Pre-Procedure Wound #5 Pre-procedure diagnosis of Wound #5 is a Venous Leg Ulcer located on the Right,Medial Lower Leg. A skin graft procedure using a bioengineered skin substitute/cellular or tissue based product was performed by Ricard Dillon., MD. Apligraf was applied and secured with Steri-Strips. 22 sq cm of product was utilized and 0 sq cm was wasted. Post Application, adaptic, gauze was applied. A Time Out was conducted at 11:40, prior to the start of the procedure. The procedure was tolerated well with a pain level of 0 throughout and a pain level of 0 following the procedure. Post procedure Diagnosis Wound #5: Same as Pre-Procedure . Pre-procedure  diagnosis of Wound #5 is a Venous Leg Ulcer located on the Right,Medial Lower Leg . There was a Three Layer Compression Therapy Procedure by Levan Hurst, RN. Post procedure Diagnosis Wound #5: Same as Pre-Procedure Wound #16 Pre-procedure  diagnosis of Wound #16 is a Pressure Ulcer located on the Right Calcaneus . There was a Three Layer Compression Therapy Procedure by Levan Hurst, RN. Post procedure Diagnosis Wound #16: Same as Pre-Procedure Plan Follow-up Appointments: Return Appointment in 2 weeks. - MD visit Nurse Visit: - 1 week for rewrap Dressing Change Frequency: Do not change entire dressing for one week. - right leg/heel Wound #20 Left,Lateral Malleolus: Change dressing every day. Skin Barriers/Peri-Wound Care: Moisturizing lotion - both legs Wound Cleansing: May shower with protection. Wound #20 Left,Lateral Malleolus: May shower and wash wound with soap and water. Primary Wound Dressing: Wound #16 Right Calcaneus: Iodoflex Wound #19 Right,Lateral Malleolus: Skin Substitute Application - Apligraf #5 Wound #5 Right,Medial Lower Leg: Skin Substitute Application - Apligraf #5 Wound #20 Left,Lateral Malleolus: Santyl Ointment Secondary Dressing: Wound #16 Right Calcaneus: Dry Gauze Heel Cup Wound #19 Right,Lateral Malleolus: Adaptic Dressing - secure with steri strips Dry Gauze Wound #5 Right,Medial Lower Leg: Adaptic Dressing - secure with steri strips Dry Gauze Wound #20 Left,Lateral Malleolus: Foam Border Edema Control: 3 Layer Compression System - Right Lower Extremity Avoid standing for long periods of time Elevate legs to the level of the heart or above for 30 minutes daily and/or when sitting, a frequency of: - throughout the day Off-Loading: Turn and reposition every 2 hours Other: - float heels off of bed/chair with pillow under calves 1. Right calcaneus we switched to Iodoflex 2. I used Apligraf to the right medial lower leg and right anterior lower leg 3. She has a new wound on the left lateral malleolus we use Santyl ointment to this area. She has not had a wound on this leg in quite some time. This does not have a viable surface. No mechanical debridement today. 4. I am going to  bring her back into look at the left lateral leg and the right medial lower leg next week. 3 layer compression bilateral Electronic Signature(s) Signed: 05/03/2020 1:05:55 PM By: Linton Ham MD Entered By: Linton Ham on 05/03/2020 13:05:09 -------------------------------------------------------------------------------- SuperBill Details Patient Name: Date of Service: [name] [name] 05/02/2020 Medical Record Number: [id number] Patient Account Number: [id number] Date of Birth/Sex: Treating RN: [birth year] (69 y.o. 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 I87.331 Chronic venous hypertension (idiopathic) with ulcer and inflammation of right lower extremity L89.616 Pressure-induced deep tissue damage of right heel Facility Procedures CPT4 Code: 24825003 ( Description: Facility Use Only) Apligraf 1 SQ CM Modifier: Quantity: 46 CPT4 Code: 70488891 1 Description: 5271 - SKIN SUB GRAFT TRNK/ARM/LEG ICD-10 Diagnosis Description I87.331 Chronic venous hypertension (idiopathic) with ulcer and inflammation of right lo L97.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 Modifier: wer extremity Quantity: 1 Physician Procedures : CPT4 Code Description Modifier [id number] - WC PHYS SKIN SUB GRAFT TRNK/ARM/LEG ICD-10 Diagnosis Description I87.331 Chronic venous hypertension (idiopathic) with ulcer and inflammation  of right lower extremity L97.213 Non-pressure chronic ulcer of  right calf with necrosis of muscle Quantity: 1 Electronic  Signature(s) Signed: 05/03/2020 1:05:55 PM By: Linton Ham MD Previous Signature: 05/02/2020 5:27:06 PM Version By: Levan Hurst RN, BSN Previous Signature: 05/02/2020 5:33:20 PM Version By: Linton Ham MD Entered By: Linton Ham on 05/03/2020 13:05:24

## 2020-05-03 ENCOUNTER — Telehealth: Payer: Self-pay | Admitting: Emergency Medicine

## 2020-05-03 ENCOUNTER — Ambulatory Visit (HOSPITAL_COMMUNITY)
Admission: RE | Admit: 2020-05-03 | Discharge: 2020-05-03 | Disposition: A | Discharge status: Home / Self Care | Payer: Medicare Other | Source: Ambulatory Visit | Attending: Emergency Medicine | Admitting: Emergency Medicine

## 2020-05-03 ENCOUNTER — Other Ambulatory Visit: Payer: Self-pay

## 2020-05-03 ENCOUNTER — Other Ambulatory Visit: Payer: Self-pay | Admitting: Emergency Medicine

## 2020-05-03 DIAGNOSIS — J849 Interstitial pulmonary disease, unspecified: Secondary | ICD-10-CM | POA: Insufficient documentation

## 2020-05-03 DIAGNOSIS — R079 Chest pain, unspecified: Secondary | ICD-10-CM | POA: Diagnosis not present

## 2020-05-03 MED ORDER — TECHNETIUM TO 99M ALBUMIN AGGREGATED
1.5000 | Freq: Once | INTRAVENOUS | Status: AC | PRN
  Administered 2020-05-03: 1.5 via INTRAVENOUS

## 2020-05-03 NOTE — Telephone Encounter (Signed)
Called and spoke with Patient.  Patient stated she wanted  to let Dr. Lamonte Sakai know she completed her cxr and scan at [facility] today.  Message routed to Dr. Lamonte Sakai as Juluis Rainier

## 2020-05-04 NOTE — Telephone Encounter (Signed)
Spoke with pt. She is aware of results. Nothing further was needed.  

## 2020-05-04 NOTE — Telephone Encounter (Signed)
Please let her know that I reviewed. There was no significant evidence to support blood clot in the lungs. This is good news.

## 2020-05-09 ENCOUNTER — Encounter (HOSPITAL_BASED_OUTPATIENT_CLINIC_OR_DEPARTMENT_OTHER): Payer: Medicare Other | Admitting: Internal Medicine

## 2020-05-09 DIAGNOSIS — L97811 Non-pressure chronic ulcer of other part of right lower leg limited to breakdown of skin: Secondary | ICD-10-CM | POA: Diagnosis not present

## 2020-05-09 DIAGNOSIS — L97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: Secondary | ICD-10-CM | POA: Diagnosis not present

## 2020-05-09 DIAGNOSIS — I739 Peripheral vascular disease, unspecified: Secondary | ICD-10-CM | POA: Diagnosis not present

## 2020-05-09 DIAGNOSIS — I10 Essential (primary) hypertension: Secondary | ICD-10-CM | POA: Diagnosis not present

## 2020-05-09 DIAGNOSIS — L97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: Secondary | ICD-10-CM | POA: Diagnosis not present

## 2020-05-09 DIAGNOSIS — L89616 Pressure-induced deep tissue damage of right heel: Secondary | ICD-10-CM | POA: Diagnosis not present

## 2020-05-10 NOTE — Progress Notes (Signed)
[name] [name], [name] [name] ([id number]) Visit Report for 05/09/2020 SuperBill Details Patient Name: Date of Service: [name] 05/09/2020 Medical Record Number: [id number] Patient Account Number: [id number] Date of Birth/Sex: Treating RN: 1951/06/06 (69 y.o. 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

## 2020-05-10 NOTE — Progress Notes (Signed)
SHAUNICE, LEVITAN ([id number]) Visit Report for 05/09/2020 Arrival Information Details Patient Name: Date of Service: [name] 05/09/2020 11:15 A M Medical Record Number: [id number] Patient Account Number: [id number] Date of Birth/Sex: Treating RN: [birth year] (69 y.o. [name] Primary Care Caroleen Stoermer: [name], [place] RY Other Clinician: Referring Davison Ohms: Treating Maurene Hollin/Extender: [name], MA RY Weeks in Treatment: 264 Visit Information History Since Last Visit Added or deleted any medications: No Patient Arrived: Wheel Chair Any new allergies or adverse reactions: No Arrival Time: 11:28 Had a fall or experienced change in No Accompanied By: husband activities of daily living that may affect Transfer Assistance: None risk of falls: Patient Identification Verified: Yes Signs or symptoms of abuse/neglect since last visito No Secondary Verification Process Completed: Yes Hospitalized since last visit: No Patient Requires Transmission-Based Precautions: No Implantable device outside of the clinic excluding No Patient Has Alerts: No cellular tissue based products placed in the center since last visit: Has Dressing in Place as Prescribed: Yes Pain Present Now: No Electronic Signature(s) Signed: 05/09/2020 5:18:32 PM By: Deon Pilling Entered By: Deon Pilling on 05/09/2020 11:28:27 -------------------------------------------------------------------------------- Compression Therapy Details Patient Name: Date of Service: [name] 05/09/2020 11:15 A M Medical Record Number: [id number] Patient Account Number: [id number] Date of Birth/Sex: Treating RN: [birth year] (69 y.o. [name] Primary Care Jamear Carbonneau: [name], [place] RY Other Clinician: Referring Fairley Copher: Treating Sharnika Binney/Extender: [name], MA RY Weeks in Treatment: 424 Compression Therapy Performed for Wound Assessment: Wound #16 Right Calcaneus Performed By:  Clinician Kela Millin, RN Compression Type: Three Layer Electronic Signature(s) Signed: 05/10/2020 5:14:55 PM By: Kela Millin Entered By: Kela Millin on 05/09/2020 11:35:30 -------------------------------------------------------------------------------- Compression Therapy Details Patient Name: Date of Service: [name] 05/09/2020 11:15 A M Medical Record Number: [id number] Patient Account Number: [id number] Date of Birth/Sex: Treating RN: 04-08-1951 (69 y.o. [name] Primary Care Lashea Goda: Other Clinician: [name], [place] RY Referring Melani Brisbane: Treating Rayhan Groleau/Extender: [name], MA RY Weeks in Treatment: 424 Compression Therapy Performed for Wound Assessment: Wound #19 Right,Lateral Malleolus Performed By: Clinician Kela Millin, RN Compression Type: Three Layer Electronic Signature(s) Signed: 05/10/2020 5:14:55 PM By: Kela Millin Entered By: Kela Millin on 05/09/2020 11:35:31 -------------------------------------------------------------------------------- Compression Therapy Details Patient Name: Date of Service: [name] 05/09/2020 11:15 A M Medical Record Number: [id number] Patient Account Number: [id number] Date of Birth/Sex: Treating RN: 08-26-1951 (69 y.o. [name] Primary Care Jakyren Fluegge: [name], [place] RY Other Clinician: Referring Rusell Meneely: Treating Menelik Mcfarren/Extender: [name], MA RY Weeks in Treatment: 424 Compression Therapy Performed for Wound Assessment: Wound #5 Right,Medial Lower Leg Performed By: Clinician Kela Millin, RN Compression Type: Three Layer Electronic Signature(s) Signed: 05/10/2020 5:14:55 PM By: Kela Millin Entered By: Kela Millin on 05/09/2020 11:35:31 -------------------------------------------------------------------------------- Encounter Discharge Information Details Patient Name: Date of Service: [name], [name]  05/09/2020 11:15 A M Medical Record Number: [id number] Patient Account Number: [id number] Date of Birth/Sex: Treating RN: Jul 29, 1951 (69 y.o. [name] Primary Care Jamyiah Labella: [name], [place] RY Other Clinician: Referring Louanna Vanliew: Treating Juliya Magill/Extender: [name], MA RY Weeks in Treatment: [phone] Encounter Discharge Information Items Discharge Condition: Stable Ambulatory Status: Wheelchair Discharge Destination: Home Transportation: Private Auto Accompanied By: husband Schedule Follow-up Appointment: Yes Clinical Summary of Care: Patient Declined Electronic Signature(s) Signed: 05/10/2020 5:14:55 PM By: Kela Millin Entered By: Kela Millin on  05/09/2020 11:39:24 -------------------------------------------------------------------------------- Patient/Caregiver Education Details Patient Name: Date of Service: [name] 5/10/2021andnbsp11:15 A M Medical Record Number: [id number] Patient Account Number: [id number] Date of Birth/Gender: Treating RN: June 23, 1951 (69 y.o. [name] Primary Care Physician: [name], [place] RY Other Clinician: Referring Physician: Treating Physician/Extender: [name], MA RY Weeks in Treatment: [phone] Education Assessment Education Provided To: Patient Education Topics Provided Wound/Skin Impairment: Handouts: Caring for Your Ulcer Methods: Explain/Verbal Responses: State content correctly Electronic Signature(s) Signed: 05/10/2020 5:14:55 PM By: Kela Millin Entered By: Kela Millin on 05/09/2020 11:39:05 -------------------------------------------------------------------------------- Wound Assessment Details Patient Name: Date of Service: [name] 05/09/2020 11:15 A M Medical Record Number: [id number] Patient Account Number: [id number] Date of Birth/Sex: Treating RN: 04-12-1951 (69 y.o. [name] Primary Care Ariabella Brien: [name], [place] RY Other  Clinician: Referring Eloisa Chokshi: Treating Warnell Rasnic/Extender: [name], MA RY Weeks in Treatment: 424 Wound Status Wound Number: 16 Primary Pressure Ulcer Etiology: Wound Location: Right Calcaneus Wound Open Wounding Event: Pressure Injury Status: Date Acquired: 01/04/2020 Comorbid Anemia, Hypertension, Peripheral Arterial Disease, Peripheral Weeks Of Treatment: 18 History: Venous Disease, Raynauds, Scleroderma, Rheumatoid Arthritis, Clustered Wound: No Osteoarthritis Wound Measurements Length: (cm) 1.1 Width: (cm) 1.6 Depth: (cm) 0.3 Area: (cm) 1.382 Volume: (cm) 0.415 % Reduction in Area: 89% % Reduction in Volume: 67% Epithelialization: None Tunneling: No Undermining: No Wound Description Classification: Unstageable/Unclassified Wound Margin: Distinct, outline attached Exudate Amount: Small Exudate Type: Serous Exudate Color: amber Foul Odor After Cleansing: No Slough/Fibrino Yes Wound Bed Granulation Amount: Small (1-33%) Exposed Structure Granulation Quality: Pink Fascia Exposed: No Necrotic Amount: Large (67-100%) Fat Layer (Subcutaneous Tissue) Exposed: No Necrotic Quality: Eschar, Adherent Slough Tendon Exposed: No Muscle Exposed: No Joint Exposed: No Bone Exposed: No Treatment Notes Wound #16 (Right Calcaneus) 1. Cleanse With Wound Cleanser Soap and water 2. Periwound Care Moisturizing lotion 3. Primary Dressing Applied Iodoflex 4. Secondary Dressing Dry Gauze 6. Support Layer Applied 3 layer compression wrap Notes patient declined heel cup. Electronic Signature(s) Signed: 05/09/2020 5:18:32 PM By: Deon Pilling Signed: 05/10/2020 5:17:28 PM By: Levan Hurst RN, BSN Entered By: Deon Pilling on 05/09/2020 11:31:06 -------------------------------------------------------------------------------- Wound Assessment Details Patient Name: Date of Service: [name] 05/09/2020 11:15 A M Medical Record Number:  [id number] Patient Account Number: [id number] Date of Birth/Sex: Treating RN: 1951/06/13 (69 y.o. [name] Primary Care Avaiyah Strubel: [name], [place] RY Other Clinician: Referring Keyonte Cookston: Treating Ulla Mckiernan/Extender: [name], MA RY Weeks in Treatment: 424 Wound Status Wound Number: 19 Primary Venous Leg Ulcer Etiology: Wound Location: Right, Lateral Malleolus Wound Open Wounding Event: Gradually Appeared Status: Date Acquired: 05/02/2020 Comorbid Anemia, Hypertension, Peripheral Arterial Disease, Peripheral Weeks Of Treatment: 1 History: Venous Disease, Raynauds, Scleroderma, Rheumatoid Arthritis, Clustered Wound: No Osteoarthritis Wound Measurements Length: (cm) 0.6 Width: (cm) 0.3 Depth: (cm) 0.2 Area: (cm) 0.141 Volume: (cm) 0.028 % Reduction in Area: 0% % Reduction in Volume: 0% Epithelialization: None Tunneling: No Undermining: No Wound Description Classification: Full Thickness Without Exposed Support Structures Wound Margin: Distinct, outline attached Exudate Amount: Medium Exudate Type: Serosanguineous Exudate Color: red, brown Foul Odor After Cleansing: No Slough/Fibrino Yes Wound Bed Granulation Amount: Medium (34-66%) Exposed Structure Granulation Quality: Red, Pink Fascia Exposed: No Necrotic Amount: Medium (34-66%) Fat Layer (Subcutaneous Tissue) Exposed: Yes Necrotic Quality: Adherent Slough Tendon Exposed: No Muscle Exposed: No Joint Exposed: No Bone Exposed: No Treatment Notes Wound #19 (Right, Lateral Malleolus) 1. Cleanse With  Wound Cleanser Soap and water 2. Periwound Care Moisturizing lotion 3. Primary Dressing Applied Other primary dressing (specifiy in notes) 4. Secondary Dressing ABD Pad 6. Support Layer Applied 3 layer compression wrap Notes reinforced adaptic steri-strips Electronic Signature(s) Signed: 05/10/2020 5:14:55 PM By: Kela Millin Signed: 05/10/2020 5:17:28 PM By: Levan Hurst RN, BSN Entered  By: Kela Millin on 05/09/2020 11:34:49 -------------------------------------------------------------------------------- Wound Assessment Details Patient Name: Date of Service: [name] 05/09/2020 11:15 A M Medical Record Number: [id number] Patient Account Number: [id number] Date of Birth/Sex: Treating RN: Jan 24, 1951 (69 y.o. [name] Primary Care Issabela Lesko: [name], [place] RY Other Clinician: Referring Ayana Imhof: Treating Jayden Rudge/Extender: [name], MA RY Weeks in Treatment: 424 Wound Status Wound Number: 20 Primary Arterial Insufficiency Ulcer Etiology: Wound Location: Left, Lateral Malleolus Wound Open Wounding Event: Gradually Appeared Status: Date Acquired: 05/02/2020 Comorbid Anemia, Hypertension, Peripheral Arterial Disease, Peripheral Weeks Of Treatment: 1 History: Venous Disease, Raynauds, Scleroderma, Rheumatoid Arthritis, Clustered Wound: No Osteoarthritis Wound Measurements Length: (cm) 0.8 Width: (cm) 0.7 Depth: (cm) 0.2 Area: (cm) 0.44 Volume: (cm) 0.088 % Reduction in Area: 0% % Reduction in Volume: 0% Epithelialization: None Tunneling: No Undermining: No Wound Description Classification: Unclassifiable Wound Margin: Flat and Intact Exudate Amount: Medium Exudate Type: Serous Exudate Color: amber Foul Odor After Cleansing: No Slough/Fibrino Yes Wound Bed Granulation Amount: None Present (0%) Exposed Structure Necrotic Amount: Large (67-100%) Fascia Exposed: No Necrotic Quality: Adherent Slough Fat Layer (Subcutaneous Tissue) Exposed: No Tendon Exposed: No Muscle Exposed: No Joint Exposed: No Bone Exposed: No Treatment Notes Wound #20 (Left, Lateral Malleolus) 1. Cleanse With Wound Cleanser 2. Periwound Care Skin Prep 3. Primary Dressing Applied Santyl 4. Secondary Dressing Foam Border Dressing Electronic Signature(s) Signed: 05/09/2020 5:18:32 PM By: Deon Pilling Signed: 05/10/2020 5:17:28 PM By: Levan Hurst RN, BSN Entered By: Deon Pilling on 05/09/2020 11:31:48 -------------------------------------------------------------------------------- Wound Assessment Details Patient Name: Date of Service: [name] 05/09/2020 11:15 A M Medical Record Number: [id number] Patient Account Number: [id number] Date of Birth/Sex: Treating RN: September 15, 1951 (69 y.o. [name] Primary Care Lezly Rumpf: [name], [place] RY Other Clinician: Referring Artis Buechele: Treating Marshe Shrestha/Extender: [name], MA RY Weeks in Treatment: 424 Wound Status Wound Number: 5 Primary Etiology: Venous Leg Ulcer Wound Location: Right, Medial Lower Leg Wound Status: Open Wounding Event: Gradually Appeared Date Acquired: 01/13/2013 Weeks Of Treatment: 380 Clustered Wound: Yes Wound Measurements Length: (cm) 2.5 Width: (cm) 2 Depth: (cm) 0.6 Area: (cm) 3.927 Volume: (cm) 2.356 % Reduction in Area: -177.7% % Reduction in Volume: -1570.9% Wound Description Classification: Full Thickness With Exposed Support Structures Treatment Notes Wound #5 (Right, Medial Lower Leg) 1. Cleanse With Wound Cleanser Soap and water 2. Periwound Care Moisturizing lotion 3. Primary Dressing Applied Other primary dressing (specifiy in notes) 4. Secondary Dressing ABD Pad 6. Support Layer Applied 3 layer compression wrap Notes reinforced adaptic steri-strips Electronic Signature(s) Signed: 05/09/2020 5:18:32 PM By: Deon Pilling Signed: 05/10/2020 5:17:28 PM By: Levan Hurst RN, BSN Entered By: Deon Pilling on 05/09/2020 11:29:40 -------------------------------------------------------------------------------- Vitals Details Patient Name: Date of Service: [name], [name] 05/09/2020 11:15 A M Medical Record Number: [id number] Patient Account Number: [id number] Date of Birth/Sex: Treating RN: Jun 26, 1951 (69 y.o. [name] Primary Care Coltin Casher: [name], [place] RY Other Clinician: Referring  Jacklynn Dehaas: Treating Clif Serio/Extender: [name], MA RY Weeks in Treatment: 424 Vital Signs Time Taken: 11:27 Temperature (F): 98.6 Height (in): 68 Pulse (  bpm): 74 Weight (lbs): 132 Respiratory Rate (breaths/min): 19 Body Mass Index (BMI): 20.1 Blood Pressure (mmHg): 156/54 Reference Range: 80 - 120 mg / dl Electronic Signature(s) Signed: 05/09/2020 5:18:32 PM By: Deon Pilling Entered By: Deon Pilling on 05/09/2020 11:28:34

## 2020-05-16 ENCOUNTER — Encounter (HOSPITAL_BASED_OUTPATIENT_CLINIC_OR_DEPARTMENT_OTHER): Payer: Medicare Other | Admitting: Internal Medicine

## 2020-05-16 DIAGNOSIS — I739 Peripheral vascular disease, unspecified: Secondary | ICD-10-CM | POA: Diagnosis not present

## 2020-05-16 DIAGNOSIS — I7389 Other specified peripheral vascular diseases: Secondary | ICD-10-CM | POA: Diagnosis not present

## 2020-05-16 DIAGNOSIS — L97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: Secondary | ICD-10-CM | POA: Diagnosis not present

## 2020-05-16 DIAGNOSIS — L97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: Secondary | ICD-10-CM | POA: Diagnosis not present

## 2020-05-16 DIAGNOSIS — L97322 Non-pressure chronic ulcer of left ankle with fat layer exposed: Secondary | ICD-10-CM | POA: Diagnosis not present

## 2020-05-16 DIAGNOSIS — L97811 Non-pressure chronic ulcer of other part of right lower leg limited to breakdown of skin: Secondary | ICD-10-CM | POA: Diagnosis not present

## 2020-05-16 DIAGNOSIS — I10 Essential (primary) hypertension: Secondary | ICD-10-CM | POA: Diagnosis not present

## 2020-05-16 DIAGNOSIS — L89616 Pressure-induced deep tissue damage of right heel: Secondary | ICD-10-CM | POA: Diagnosis not present

## 2020-05-16 NOTE — Progress Notes (Signed)
[name] [name] ([id number]) Visit Report for 05/16/2020 Arrival Information Details Patient Name: Date of Service: [name] 05/16/2020 11:00 A M Medical Record Number: [id number] Patient Account Number: [id number] Date of Birth/Sex: Treating RN: 02-03-1951 (69 y.o. [name] [name], [place] Primary Care Edgar Corrigan: [name], [place] RY Other Clinician: Referring Liberti Appleton: Treating Walid Haig/Extender: [name], MA RY Weeks in Treatment: 425 Visit Information History Since Last Visit Added or deleted any medications: No Patient Arrived: Wheel Chair Any new allergies or adverse reactions: No Arrival Time: 11:59 Had a fall or experienced change in No Accompanied By: husband activities of daily living that may affect Transfer Assistance: None risk of falls: Patient Identification Verified: Yes Signs or symptoms of abuse/neglect since last visito No Secondary Verification Process Completed: Yes Hospitalized since last visit: No Patient Requires Transmission-Based Precautions: No Implantable device outside of the clinic excluding No Patient Has Alerts: No cellular tissue based products placed in the center since last visit: Has Dressing in Place as Prescribed: Yes Has Compression in Place as Prescribed: Yes Pain Present Now: No Electronic Signature(s) Signed: 05/16/2020 5:05:18 PM By: Kela Millin Entered By: Kela Millin on 05/16/2020 12:00:04 -------------------------------------------------------------------------------- Compression Therapy Details Patient Name: Date of Service: [name] 05/16/2020 11:00 A M Medical Record Number: [id number] Patient Account Number: [id number] Date of Birth/Sex: Treating RN: [birth year] (69 y.o. [name] [name] Primary Care Aarron Wierzbicki: [name], [place] R[place] Other Clinician: Referring Eban Weick: Treating Ahlijah Raia/Extender: [name], MA RY Weeks in Treatment: 425 Compression Therapy Performed for Wound  Assessment: Wound #5 Right,Medial Lower Leg Performed By: Clinician Levan Hurst, RN Compression Type: Three Layer Post Procedure Diagnosis Same as Pre-procedure Electronic Signature(s) Signed: 05/16/2020 5:54:52 PM By: Levan Hurst RN, BSN Entered By: Levan Hurst on 05/16/2020 16:50:23 -------------------------------------------------------------------------------- Compression Therapy Details Patient Name: Date of Service: [name] 05/16/2020 11:00 A M Medical Record Number: [id number] Patient Account Number: [id number] Date of Birth/Sex: Treating RN: September 10, 1951 (69 y.o. [name] [name] Primary Care Librado Guandique: [name], [place] R[place] Other Clinician: Referring Elfida Shimada: Treating Kayelyn Lemon/Extender: [name], MA RY Weeks in Treatment: 425 Compression Therapy Performed for Wound Assessment: Wound #19 Right,Lateral Malleolus Performed By: Clinician Levan Hurst, RN Compression Type: Three Layer Post Procedure Diagnosis Same as Pre-procedure Electronic Signature(s) Signed: 05/16/2020 5:54:52 PM By: Levan Hurst RN, BSN Entered By: Levan Hurst on 05/16/2020 16:50:23 -------------------------------------------------------------------------------- Encounter Discharge Information Details Patient Name: Date of Service: [name] [name], [name] 05/16/2020 11:00 A M Medical Record Number: [id number] Patient Account Number: [id number] Date of Birth/Sex: Treating RN: [birth year] (69 y.o. [name] [name] Primary Care Germani Gavilanes: [name], [place] R[place] Other Clinician: Referring Kailand Seda: Treating Lydian Chavous/Extender: [name], MA RY Weeks in Treatment: [phone] Encounter Discharge Information Items Post Procedure Vitals Discharge Condition: Stable Temperature (F): 98.4 Ambulatory Status: Wheelchair Pulse (bpm): 68 Discharge Destination: Home Respiratory Rate (breaths/min): 19 Transportation: Private Auto Blood Pressure (mmHg): 138/42 Accompanied By:  husband Schedule Follow-up Appointment: Yes Clinical Summary of Care: Patient Declined Electronic Signature(s) Signed: 05/16/2020 5:54:52 PM By: Levan Hurst RN, BSN Entered By: Levan Hurst on 05/16/2020 16:54:03 -------------------------------------------------------------------------------- Lower Extremity Assessment Details Patient Name: Date of Service: [name] [name], [name] 05/16/2020 11:00 A M Medical Record Number: [id number] Patient Account Number: [id number] Date of Birth/Sex: Treating RN: 02/02/1951 (69 y.o. [name] [name] Primary Care Amarrion Pastorino: [name], [place] R[place] Other Clinician: Referring Bertin Inabinet: Treating Dennie Moltz/Extender: Dellia Nims  Santiago Bumpers, MA RY Weeks in Treatment: 425 Edema Assessment Assessed: [Left: No] [Right: No] Edema: [Left: Yes] [Right: Yes] Calf Left: Right: Point of Measurement: 36 cm From Medial Instep 37 cm 33 cm Ankle Left: Right: Point of Measurement: 10 cm From Medial Instep 21 cm 23.5 cm Vascular Assessment Pulses: Dorsalis Pedis Palpable: [Left:Yes] [Right:Yes] Electronic Signature(s) Signed: 05/16/2020 5:05:18 PM By: Kela Millin Entered By: Kela Millin on 05/16/2020 12:01:11 -------------------------------------------------------------------------------- Multi Wound Chart Details Patient Name: Date of Service: [name] 05/16/2020 11:00 A M Medical Record Number: [id number] Patient Account Number: [id number] Date of Birth/Sex: Treating RN: [birth year] (69 y.o. 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] Primary Etiology: [16:Anemia, Hypertension, Peripheral] [19:Anemia, Hypertension, Peripheral] [20:Anemia, Hypertension, Peripheral] Comorbid History: [16:Arterial Disease, Peripheral Venous Disease, Raynauds, Scleroderma, Rheumatoid Arthritis, Osteoarthritis 01/04/2020] [19:Arterial Disease, Peripheral Venous Disease, Raynauds, Scleroderma, Rheumatoid Arthritis, Osteoarthritis  05/02/2020] [20:Arterial Disease, Peripheral Venous Disease, Raynauds, Scleroderma, Rheumatoid Arthritis, Osteoarthritis 05/02/2020] Date Acquired: [16:19] [19:2] [20:2] Weeks of Treatment: [16:Open] [19:Open] [20:Open] Wound Status: [16:No] [19:No] [20:No] Clustered Wound: [16:1.2x1.7x0.4] [19:2.1x0.7x0.2] [20:0.7x0.5x0.2] Measurements L x W x D (cm) [16:1.602] [19:1.155] [20:0.275] A (cm) : rea [16:0.641] [19:0.231] [20:0.055] Volume (cm) : [16:87.30%] [19:-719.10%] [20:37.50%] % Reduction in Area: [16:49.00%] [19:-725.00%] [20:37.50%] % Reduction in Volume: [16:Unstageable/Unclassified] [19:Full Thickness Without Exposed] [20:Unclassifiable] Classification: [16:Small] [19:Support Structures Medium] [20:Medium] Exudate A mount: [16:Serous] [19:Serosanguineous] [20:Serous] Exudate Type: [16:amber] [19:red, brown] [20:amber] Exudate Color: [16:Distinct, outline attached] [19:Distinct, outline attached] [20:Well defined, not attached] Wound Margin: [16:Small (1-33%)] [19:Medium (34-66%)] [20:None Present (0%)] Granulation Amount: [16:Pink] [19:Red, Pink] [20:N/A] Granulation Quality: [16:Large (67-100%)] [19:Medium (34-66%)] [20:Large (67-100%)] Necrotic Amount: [16:Eschar, Adherent Slough] [19:Adherent Slough] [20:Adherent Slough] Necrotic Tissue: [16:Fascia: No] [19:Fat Layer (Subcutaneous Tissue)] [20:Fascia: No] Exposed  Structures: [16:Fat Layer (Subcutaneous Tissue) Exposed: No Tendon: No Muscle: No Joint: No Bone: No None] [19:Exposed: Yes Fascia: No Tendon: No Muscle: No Joint: No Bone: No None] [20:Fat Layer (Subcutaneous Tissue) Exposed: No Tendon: No Muscle: No Joint: No  Bone: No None] Epithelialization: [16:N/A] [19:N/A] [20:Debridement - Selective/Open Wound] Debridement: Pre-procedure Verification/Time Out N/A [19:N/A] [20:12:38] Taken: [16:N/A] [19:N/A] [20:Slough] Tissue Debrided: [16:N/A] [19:N/A] [20:Non-Viable Tissue] Level: [16:N/A] [19:N/A] [20:0.35] Debridement A (sq cm): [16:rea N/A] [19:N/A] [20:Curette] Instrument: [16:N/A] [19:N/A] [20:Minimum] Bleeding: [16:N/A] [19:N/A] [20:Silver Nitrate] Hemostasis A chieved: [16:N/A] [19:N/A] [20:0] Procedural Pain: [16:N/A] [19:N/A] [20:0] Post Procedural Pain: [16:N/A] [19:N/A] [20:Procedure was tolerated well] Debridement Treatment Response: [16:N/A] [19:N/A] [20:0.7x0.5x0.2] Post Debridement Measurements L x W x D (cm) [16:N/A] [19:N/A] [20:0.055] Post Debridement Volume: (cm) [16:N/A] [19:N/A] [20:Debridement] Wound Number: 5 N/A N/A Photos: No Photos N/A N/A Right, Medial Lower Leg N/A N/A Wound Location: Gradually Appeared N/A N/A Wounding Event: Venous Leg Ulcer N/A N/A Primary Etiology: Anemia, Hypertension, Peripheral N/A N/A Comorbid History: Arterial Disease, Peripheral Venous Disease, Raynauds, Scleroderma, Rheumatoid Arthritis, Osteoarthritis 01/13/2013 N/A N/A Date A cquired: 381 N/A N/A Weeks of Treatment: Open N/A N/A Wound Status: Yes N/A N/A Clustered Wound: 2.7x2.3x0.3 N/A N/A Measurements L x W x D (cm) 4.877 N/A N/A A (cm) : rea 1.463 N/A N/A Volume (  cm) : -244.90% N/A N/A % Reduction in A rea: -937.60% N/A N/A % Reduction in Volume: Full Thickness With Exposed Support N/A N/A Classification: Structures Medium N/A N/A Exudate A mount: Serosanguineous N/A N/A Exudate Type: red, brown N/A  N/A Exudate Color: N/A N/A N/A Wound Margin: Medium (34-66%) N/A N/A Granulation A mount: Pink N/A N/A Granulation Quality: Medium (34-66%) N/A N/A Necrotic A mount: Adherent Slough N/A N/A Necrotic Tissue: Fat Layer (Subcutaneous Tissue) N/A N/A Exposed Structures: Exposed: Yes Fascia: No Tendon: No Muscle: No Joint: No Bone: No N/A N/A N/A Epithelialization: N/A N/A N/A Debridement: N/A N/A N/A Tissue Debrided: N/A N/A N/A Level: N/A N/A N/A Debridement A (sq cm): rea N/A N/A N/A Instrument: N/A N/A N/A Bleeding: N/A N/A N/A Hemostasis A chieved: N/A N/A N/A Procedural Pain: N/A N/A N/A Post Procedural Pain: Debridement Treatment Response: N/A N/A N/A Post Debridement Measurements L x N/A N/A N/A W x D (cm) N/A N/A N/A Post Debridement Volume: (cm) N/A N/A N/A Procedures Performed: Treatment Notes Electronic Signature(s) Signed: 05/16/2020 4:51:55 PM By: Linton Ham MD Signed: 05/16/2020 5:54:52 PM By: Levan Hurst RN, BSN Entered By: Linton Ham on 05/16/2020 13:11:33 -------------------------------------------------------------------------------- Multi-Disciplinary Care Plan Details Patient Name: Date of Service: [name] [name], [name] 05/16/2020 11:00 A M Medical Record Number: [id number] Patient Account Number: [id number] Date of Birth/Sex: Treating RN: 12/24/1951 (69 y.o. [name] [name] Primary Care Lakeidra Reliford: [name], [place] RY Other Clinician: Referring Kelseigh Diver: Treating Arrianna Catala/Extender: [name], MA RY Weeks in Treatment: 425 Active Inactive Wound/Skin Impairment Nursing Diagnoses: Impaired tissue integrity Knowledge deficit related to ulceration/compromised skin integrity Goals: Patient/caregiver will verbalize understanding of skin care regimen Date Initiated: 10/11/2016 Target Resolution Date: 06/03/2020 Goal Status: Active Interventions: Assess patient/caregiver ability to obtain necessary supplies Assess  patient/caregiver ability to perform ulcer/skin care regimen upon admission and as needed Assess ulceration(s) every visit Provide education on ulcer and skin care Treatment Activities: Skin care regimen initiated : 10/11/2016 Topical wound management initiated : 10/11/2016 Notes: Electronic Signature(s) Signed: 05/16/2020 5:54:52 PM By: Levan Hurst RN, BSN Entered By: Levan Hurst on 05/16/2020 16:49:23 -------------------------------------------------------------------------------- Pain Assessment Details Patient Name: Date of Service: [name] 05/16/2020 11:00 A M Medical Record Number: [id number] Patient Account Number: [id number] Date of Birth/Sex: Treating RN: July 13, 1951 (69 y.o. [name] [name] Primary Care Jennamarie Goings: [name], [place] RY Other Clinician: Referring Jaquese Irving: Treating Mattia Liford/Extender: [name], MA RY Weeks in Treatment: [phone] Active Problems Location of Pain Severity and Description of Pain Patient Has Paino No Site Locations Pain Management and Medication Current Pain Management: Electronic Signature(s) Signed: 05/16/2020 5:05:18 PM By: Kela Millin Entered By: Kela Millin on 05/16/2020 12:00:41 -------------------------------------------------------------------------------- Patient/Caregiver Education Details Patient Name: Date of Service: [name] 5/17/2021andnbsp11:00 A M Medical Record Number: [id number] Patient Account Number: [id number] Date of Birth/Gender: Treating RN: [birth year] (69 y.o. [name] [name] Primary Care Physician: [name], [place] RY Other Clinician: Referring Physician: Treating Physician/Extender: [name], MA RY Weeks in Treatment: [phone] Education Assessment Education Provided To: Patient Education Topics Provided Wound/Skin Impairment: Methods: Explain/Verbal Responses: State content correctly Electronic Signature(s) Signed: 05/16/2020 5:54:52 PM By: Levan Hurst RN, BSN Entered By: Levan Hurst on 05/16/2020 16:49:52 -------------------------------------------------------------------------------- Wound Assessment Details Patient Name: Date of Service: [name] [name], [name] 05/16/2020 11:00 A M Medical Record Number: [id number] Patient Account Number: [id number] Date of Birth/Sex: Treating RN: 06-06-1951 (69 y.o.  [name] [name] Primary Care Chaya Dehaan: [name], [place] R[place] Other Clinician: Referring Jasmarie Coppock: Treating Laquana Villari/Extender: [name], MA RY Weeks in Treatment: 425 Wound Status Wound Number: 16 Primary Pressure Ulcer Etiology: Wound Location: Right Calcaneus Wound Open Wounding Event: Pressure Injury Status: Date Acquired: 01/04/2020 Comorbid Anemia, Hypertension, Peripheral Arterial Disease, Peripheral Weeks Of Treatment: 19 History: Venous Disease, Raynauds, Scleroderma, Rheumatoid Arthritis, Clustered Wound: No Osteoarthritis Wound Measurements Length: (cm) 1.2 Width: (cm) 1.7 Depth: (cm) 0.4 Area: (cm) 1.602 Volume: (cm) 0.641 % Reduction in Area: 87.3% % Reduction in Volume: 49% Epithelialization: None Tunneling: No Undermining: No Wound Description Classification: Unstageable/Unclassified Wound Margin: Distinct, outline attached Exudate Amount: Small Exudate Type: Serous Exudate Color: amber Foul Odor After Cleansing: No Slough/Fibrino Yes Wound Bed Granulation Amount: Small (1-33%) Exposed Structure Granulation Quality: Pink Fascia Exposed: No Necrotic Amount: Large (67-100%) Fat Layer (Subcutaneous Tissue) Exposed: No Necrotic Quality: Eschar, Adherent Slough Tendon Exposed: No Muscle Exposed: No Joint Exposed: No Bone Exposed: No Treatment Notes Wound #16 (Right Calcaneus) 1. Cleanse With Soap and water 3. Primary Dressing Applied Iodoflex 4. Secondary Dressing Dry Gauze 6. Support Layer Applied 3 layer compression wrap Notes patient declined heel cup. Electronic  Signature(s) Signed: 05/16/2020 5:05:18 PM By: Kela Millin Entered By: Kela Millin on 05/16/2020 12:06:14 -------------------------------------------------------------------------------- Wound Assessment Details Patient Name: Date of Service: [name] [name], [name] 05/16/2020 11:00 A M Medical Record Number: [id number] Patient Account Number: [id number] Date of Birth/Sex: Treating RN: 03/07/1951 (69 y.o. [name] [name] Primary Care Arthea Nobel: [name], [place] R[place] Other Clinician: Referring Olive Zmuda: Treating Gracee Ratterree/Extender: [name], MA RY Weeks in Treatment: 425 Wound Status Wound Number: 19 Primary Venous Leg Ulcer Etiology: Etiology: Wound Location: Right, Lateral Malleolus Wound Open Wounding Event: Gradually Appeared Status: Date Acquired: 05/02/2020 Comorbid Anemia, Hypertension, Peripheral Arterial Disease, Peripheral Weeks Of Treatment: 2 History: Venous Disease, Raynauds, Scleroderma, Rheumatoid Arthritis, Clustered Wound: No Osteoarthritis Wound Measurements Length: (cm) 2.1 Width: (cm) 0.7 Depth: (cm) 0.2 Area: (cm) 1.155 Volume: (cm) 0.231 % Reduction in Area: -719.1% % Reduction in Volume: -725% Epithelialization: None Tunneling: No Undermining: No Wound Description Classification: Full Thickness Without Exposed Support Structures Wound Margin: Distinct, outline attached Exudate Amount: Medium Exudate Type: Serosanguineous Exudate Color: red, brown Foul Odor After Cleansing: No Slough/Fibrino Yes Wound Bed Granulation Amount: Medium (34-66%) Exposed Structure Granulation Quality: Red, Pink Fascia Exposed: No Necrotic Amount: Medium (34-66%) Fat Layer (Subcutaneous Tissue) Exposed: Yes Necrotic Quality: Adherent Slough Tendon Exposed: No Muscle Exposed: No Joint Exposed: No Bone Exposed: No Treatment Notes Wound #19 (Right, Lateral Malleolus) 1. Cleanse With Soap and water 2. Periwound Care Moisturizing lotion 3.  Primary Dressing Applied Collegen AG 4. Secondary Dressing Dry Gauze 6. Support Layer Applied 3 layer compression wrap Electronic Signature(s) Signed: 05/16/2020 5:05:18 PM By: Kela Millin Entered By: Kela Millin on 05/16/2020 12:03:22 -------------------------------------------------------------------------------- Wound Assessment Details Patient Name: Date of Service: [name] 05/16/2020 11:00 A M Medical Record Number: [id number] Patient Account Number: [id number] Date of Birth/Sex: Treating RN: 1951-04-23 (69 y.o. [name] [name] Primary Care Mollie Rossano: [name], [place] R[place] Other Clinician: Referring Shamiah Kahler: Treating Hamdi Kley/Extender: [name], MA RY Weeks in Treatment: 425 Wound Status Wound Number: 20 Primary Arterial Insufficiency Ulcer Etiology: Wound Location: Left, Lateral Malleolus Wound Open Wounding Event: Gradually Appeared Status: Date Acquired: 05/02/2020 Comorbid Anemia, Hypertension, Peripheral Arterial Disease, Peripheral Weeks Of Treatment: 2 History: Venous Disease, Raynauds, Scleroderma, Rheumatoid Arthritis, Clustered Wound: No Osteoarthritis  Wound Measurements Length: (cm) 0.7 Width: (cm) 0.5 Depth: (cm) 0.2 Area: (cm) 0.275 Volume: (cm) 0.055 % Reduction in Area: 37.5% % Reduction in Volume: 37.5% Epithelialization: None Tunneling: No Undermining: No Wound Description Classification: Unclassifiable Wound Margin: Well defined, not attached Exudate Amount: Medium Exudate Type: Serous Exudate Color: amber Foul Odor After Cleansing: No Slough/Fibrino Yes Wound Bed Granulation Amount: None Present (0%) Exposed Structure Necrotic Amount: Large (67-100%) Fascia Exposed: No Necrotic Quality: Adherent Slough Fat Layer (Subcutaneous Tissue) Exposed: No Tendon Exposed: No Muscle Exposed: No Joint Exposed: No Bone Exposed: No Treatment Notes Wound #20 (Left, Lateral Malleolus) 1. Cleanse With Wound  Cleanser 3. Primary Dressing Applied Santyl 4. Secondary Dressing Foam Border Dressing Electronic Signature(s) Signed: 05/16/2020 5:05:18 PM By: Kela Millin Entered By: Kela Millin on 05/16/2020 12:02:26 -------------------------------------------------------------------------------- Wound Assessment Details Patient Name: Date of Service: [name] [name], [name] 05/16/2020 11:00 A M Medical Record Number: [id number] Patient Account Number: [id number] Date of Birth/Sex: Treating RN: [birth year] (69 y.o. [name] [name] Primary Care Aaiden Depoy: [name], [place] R[place] Other Clinician: Referring Nadya Hopwood: Treating Jerni Selmer/Extender: [name], MA RY Weeks in Treatment: 425 Wound Status Wound Number: 5 Primary Venous Leg Ulcer Etiology: Wound Location: Right, Medial Lower Leg Wound Open Wounding Event: Gradually Appeared Status: Date Acquired: 01/13/2013 Comorbid Anemia, Hypertension, Peripheral Arterial Disease, Peripheral Weeks Of Treatment: 381 History: Venous Disease, Raynauds, Scleroderma, Rheumatoid Arthritis, Clustered Wound: Yes Osteoarthritis Wound Measurements Length: (cm) 2.7 Width: (cm) 2.3 Depth: (cm) 0.3 Area: (cm) 4.877 Volume: (cm) 1.463 % Reduction in Area: -244.9% % Reduction in Volume: -937.6% Tunneling: No Undermining: No Wound Description Classification: Full Thickness With Exposed Support Structures Exudate Amount: Medium Exudate Type: Serosanguineous Exudate Color: red, brown Foul Odor After Cleansing: No Slough/Fibrino Yes Wound Bed Granulation Amount: Medium (34-66%) Exposed Structure Granulation Quality: Pink Fascia Exposed: No Necrotic Amount: Medium (34-66%) Fat Layer (Subcutaneous Tissue) Exposed: Yes Necrotic Quality: Adherent Slough Tendon Exposed: No Muscle Exposed: No Joint Exposed: No Bone Exposed: No Treatment Notes Wound #5 (Right, Medial Lower Leg) 1. Cleanse With Soap and water 2. Periwound  Care Moisturizing lotion 3. Primary Dressing Applied Collegen AG 4. Secondary Dressing Dry Gauze 6. Support Layer Applied 3 layer compression wrap Electronic Signature(s) Signed: 05/16/2020 5:05:18 PM By: Kela Millin Entered By: Kela Millin on 05/16/2020 12:07:30 -------------------------------------------------------------------------------- Vitals Details Patient Name: Date of Service: [name] [name], [name] 05/16/2020 11:00 A M Medical Record Number: [id number] Patient Account Number: [id number] Date of Birth/Sex: Treating RN: [birth year] (69 y.o. [name] [name] Primary Care Mithran Strike: [name], [place] R[place] Other Clinician: Referring Shaheen Star: Treating Kioni Stahl/Extender: [name], MA RY Weeks in Treatment: 425 Vital Signs Time Taken: 12:00 Temperature (F): 98.4 Height (in): 68 Pulse (bpm): 68 Weight (lbs): 132 Respiratory Rate (breaths/min): 19 Body Mass Index (BMI): 20.1 Blood Pressure (mmHg): 138/42 Reference Range: 80 - 120 mg / dl Electronic Signature(s) Signed: 05/16/2020 5:05:18 PM By: Kela Millin Entered By: Kela Millin on 05/16/2020 12:00:32

## 2020-05-16 NOTE — Progress Notes (Signed)
DELCIA, SPITZLEY ([id number]) Visit Report for 05/16/2020 Debridement Details Patient Name: Date of Service: [name] 05/16/2020 11:00 A M Medical Record Number: [id number] Patient Account Number: [id number] Date of Birth/Sex: Treating RN: 12/01/1951 (69 y.o. [name] Primary Care Provider: [name], [place] RY Other Clinician: Referring Provider: Treating Provider/Extender: [name], [place] RY Weeks in Treatment: 425 Debridement Performed for Assessment: Wound #20 Left,Lateral Malleolus Performed By: Physician Ricard Dillon., MD Debridement Type: Debridement Severity of Tissue Pre Debridement: Fat layer exposed Level of Consciousness (Pre-procedure): Awake and Alert Pre-procedure Verification/Time Out Yes - 12:38 Taken: Start Time: 12:38 T Area Debrided (L x W): otal 0.7 (cm) x 0.5 (cm) = 0.35 (cm) Tissue and other material debrided: Viable, Non-Viable, Slough, Slough Level: Non-Viable Tissue Debridement Description: Selective/Open Wound Instrument: Curette Bleeding: Minimum Hemostasis Achieved: Silver Nitrate End Time: 12:40 Procedural Pain: 0 Post Procedural Pain: 0 Response to Treatment: Procedure was tolerated well Level of Consciousness (Post- Awake and Alert procedure): Post Debridement Measurements of Total Wound Length: (cm) 0.7 Width: (cm) 0.5 Depth: (cm) 0.2 Volume: (cm) 0.055 Character of Wound/Ulcer Post Debridement: Requires Further Debridement Severity of Tissue Post Debridement: Fat layer exposed Post Procedure Diagnosis Same as Pre-procedure Electronic Signature(s) Signed: 05/16/2020 4:51:55 PM By: Linton Ham MD Signed: 05/16/2020 5:54:52 PM By: Levan Hurst RN, BSN Entered By: Linton Ham on 05/16/2020 13:11:47 -------------------------------------------------------------------------------- HPI Details Patient Name: Date of Service: [name], [name] 05/16/2020 11:00 A M Medical Record Number:  [id number] Patient Account Number: [id number] Date of Birth/Sex: Treating RN: 11-07-1951 (69 y.o. [name] Primary Care Provider: [name], [place] RY Other Clinician: Referring Provider: Treating Provider/Extender: [name], MA RY Weeks in Treatment: 425 History of Present Illness HPI Description: this is a patient who initially came to [place] for wounds on the medial malleoli bilaterally as well as her upper medial lower extremities bilaterally. These wounds eventually healed with assistance of Apligraf's bilaterally. While this was occurring she developed the current wound which opened into a fairly substantial wound on the right lower extremity. These are mostly secondary to venous stasis physiology however the patient also has underlying scleroderma, pulmonary hypertension. The wound has been making good progress lately with the Hydrofera Blue-based dressing. 03/17/2015; patient had a arterial evaluation a year ago. Her right ABI was 0.86 left was 1.0. T brachial index was 0.41 on the right 0.45 on the left. Her oe bilateral common femoral artery waveforms were triphasic. Her white popliteal posterior tibial artery and anterior tibial artery waveforms were monophasic with good amplitude. Luteal artery waveforms were biphasic it was felt that her bilateral great toe pressures are of normal although adequate for tissue healing. 03/24/2015. The condition of this wound is not really improved that. He was covered with as fibrinous surface slough and eschar. This underwent an aggressive debridement with both a curette and scalpel. I still cannot really get down to what I can would consider to be a viable surface. There is no evidence of infection. Previous workup for ischemia roughly a year ago was negative nevertheless I think that continues to be a concern 04/07/15. The patient arrived for application of second Apligraf. Once again the surface of this wound is certainly less viable than  I would like for an advanced treatment option. An aggressive debridement was done. She developed some arteriolar bleeding which required that along pressure and silver alginate. 04/21/15: change in the condition of this wound.  Once again it is covered in a gelatinous surface slough. After debridement today surface of the wound looks somewhat better but now a heeling surface 05/05/15 Apligraf #4 applied.Still a lot of slough on this wound. 05/19/15 Apligraf #5 applied. Still a lot of slough on this wound I did not aggressively remove this 06/02/15; continued copious amounts of surface slough. This debridement fairly easily. 06/08/2015 -- the last time she had a venous duplex study done was over 3 years ago and the surgery was prior to that. I have recommended that she sees Dr. early for a another opinion regarding a repeat venous duplex and possibly more endovenous ablation of vein stripping of micro-phlebotomies. 06/16/15; wound has a gelatinous surface eschar that the debridement fairly easily to a point. I don't disagree with the venous workup and perhaps even arterial re-evaluation. She is on prednisone 5 mg and continue his medications for her pulmonary artery hypertension I am not sure if the latter have any wound care healing issues I would need to investigate this. 06/23/15 continues with a gelatinous surface eschar with of fibrinous underlying. What I can see of this wound does not look unhealthy however I just can't get this material which I think is 2 different layers off. Empiric culture done 06/30/15; unfortunately not a lot of change in this wound. A gelatinous surface eschar is easily removed however it has a tight fibrinous surface underneath the. 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 The wound comes back and basically and unchanged state the. She has a gelatinous surface that is more easily removed however there is a tightly fibrinous surface underneath the. There is no  evidence of infection. She has a vascular follow-up next month. I would have to inject her in order to do a more aggressive debridement of this area 07/21/15 the wound is roughly in the same state albeit the debridement is done with greater ease. There is less of the fibrinous eschar underneath the. There is no evidence of infection. She has follow-up with vascular surgery next week. No evidence of surrounding infection. Her original distal wounds healed while this one formed. 07/28/15; wound is easier to debride. No wound erythema. She is seeing Dr. [name] next week. 08/18/15 Has been to [place] for repeat ablation. Have been using medihoney pad with some improvement 08/25/15; absolutely no change in the condition of this wound in either its overall size or surface condition in many months now. At one point I had this down to [place] healthier surface I think with [facility] however this did not actually progress towards closure. Do not believe that the wound has actually deteriorated in terms of volume at all. We have been using a medihoney pad which allows easier debridement of the gelatinous eschar but again no overall actual improvement. the patient is going towards an ablation with pain and vascular which I think is scheduled for next week. The only other investigation that I could first see would be a biopsy. She does have underlying scleroderma 09/02/15 eschar is much easier to debridement however the base of this does not look particularly vibrant. We changed to Iodoflex. The patient had her ablation earlier this week 09/09/15 again the debridement over the base of this wound is easier and the base of the wound looks considerably better. We will continue the Iodoflex. Dr. Tawni Millers has expressed his satisfaction with the result of her ablation 09/15/15 once again the wound is relatively free of surface eschar.  No debridement was done today. It has a pale-looking base to it. although this is not as  deep as it once was it seems to be expanding especially inferiorly. She has had recent venous ablation but this is no closer to healing.I've gone ahead and done a punch biopsy this from the inferior part of the wound close to normal skin 09/22/15: the wound is relatively free of surface eschar. There is some surrounding eschar. I'm not exactly sure at what level the surface is that I am seeing. 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 No evidence of atypical infection, malignancy. 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 No debridement was done 09/29/15; the wound surface is relatively free of surface eschar. There is eschar surrounding the walls of the wound. Aside from the improvement in the amount of surface slough. This wound has not progressed any towards closure. There is not even a surface that looks like there at this is ready. There is no evidence of any infection nor maligancy based on biopsy I did on 9/15. I continued with the Iodoflex however I am looking towards some alternative to try to promote some closure or filling in of this surface. Consider triple layer Oasis. Collagen did not result in adequate control of the surface slough 10/13/15; the patient was in hospital last week with severe anemia. The wound looks somewhat better after debridement although there is widening medially. There is no evidence of infection. 10/20/15; patient's wound on the right lateral lower leg is essentially unchanged. This underwent a light surface debridement and in general the debridement is easier and the surface looks improved. I noted in doing this on the side of the wound what appears to be a piece of calcium deposition. The patient noted that she had previous things on the tips of her fingers. 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 She follows with rheumatology and I  have asked them to talk to her about this. In view of that S the nonhealing ulcer may have something to do with calcinosis and also unrelieved Raynaud's disease in this area. I should note that her original wounds on the right and left medial malleolus and the inner aspects of both legs just below the knees did however heal over 10/30/15; the patient's wound on the right lateral lower leg is essentially unchanged. I was able to remove some calcified material from the medial wound edge. 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 Otherwise the wound appears essentially unchanged there is less adherent surface eschar. Some of the calcified material was sent to pathology for analysis 11/17/15. The patient's wound on the right lateral leg is essentially unchanged. Wider Medially. 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 There is some surface slough but I did not debridement this. No evidence of surrounding infection. 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 This will make healing difficult 12/15/15; the wound has less depth surface has a fibrinous slough and calcifications in the wound edge no evidence of infection 12/22/15; the wound definitely has less Fibrinous slough on the base. Calcifications around the wound edges are still evident. Although the  wound bed looks healthier it is still pale in appearance. Previous biopsy did not show malignancy 01/04/15; surgical debridement of nonviable slough and subcutaneous tissue the wound cleans up quite nicely but appears to be expanding outward calcifications around the wound edges are still there. Previous biopsy did not show  malignancy fungus or vasculitis but a panniculitis. She is to see her rheumatologist I'll see if he has any opinions on this. My punch biopsy done in September did not show calcifications although these are clearly evident. 01/19/16 light selective debridement of nonviable surface slough. There is epithelialization medially. This gives me reason for cautious optimism. She has been to see her rheumatologist, there is nothing that can be done for this type of soft tissue calcification associated with scleroderma 02/02/16 no debridement although there is a light surface slough. She has 2 peninsulas of skin 1 inferiorly and one medially. We continue to make a slight and slow but definite progressive here 02/16/16; light surface debridement with more attention to the circumference of the wound bed where the fibrinous eschar is more prevalent. No calcifications detected. She seems to have done nicely with the [facility] with some epithelialization and some improvement in the overall wound volume. 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 Continued attention to the circumference of the wound where the fibrinous eschar in calcinosis or prevalent. No calcifications were detected. She has continued improvement with Hydrofera Blue. The wound is no longer as deep 03/15/16 surgical debridement done to remove surface escha Especially around the circumference of the wound where there is nonviable subcutaneous tissue. In spite of this there is considerable improvement in the overall dimensions and depth of the wound. Islands of epithelialization are seen especially medially inferiorly and superiorly to a lesser extent. She is using Hydrofera Blue at home 03/29/16; surgical debridement done to remove surface eschar and nonviable subcutaneous tissue. This cleans up quite nicely mention  slightly larger in terms of length and width however depth is less 04/12/16; continued gradual improvement in terms of depth and the condition of the wound base. Debridement is done. Continuing long standing Hydrofera Blue at home with Kerlix Coban wraps 04/26/16; continued gradual improvement in terms of depth and management as well as condition of the wound base. Surgical debridement done she continues with Hydrofera Blue. 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 She initially came to this clinic venous insufficiency ulcers which have long since healed 05/17/16 continued improvement in terms of the depth and measurements of this wound although she has a tightly adherent fibrinous slough each time. We've been continued with long standing Hydrofera Blue which seems to done as well for this wound is many advanced treatment options. 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 She also has chronic venous insufficiency. She has an irritation on her lateral right ankle secondary to our wraps 05/10/16; wound appears to be smaller especially on the medial aspect and especially in the width. Wound was debridement surface looks better. She is also been in the hospital apparently with anemia again she tells me she had an endoscopy. Since she got home after 3 days which I believe was sometime last week she has had an irritated painful area on the right lateral ankle surrounding the lateral malleolus 05/31/16; much more adherent surface slough today then recently although I don't think  the dimensions of changed that much. A more aggressive debridement is required. The irritated area over her medial malleolus is more pruritic and painful and I don't think represents cellulitis 06/14/16; no major change in her wound dimensions however there is more tightly adherent surface slough which is disappointing. As well as she appears to have a new  small area medially. Furthermore an irritated uncomfortable area on the lateral aspect of her right foot just below the lateral malleolus. 06/21/16; I'm seeing the patient back in follow-up for the new areas under her major wound on the right anterior leg. She has been using Hydrofera Blue to this area probably for several months now and although the dressings seem to be helping for quite a period of time I think things have stagnated lately. She comes in today with a relatively tight adherent surface slough and really no changes in the wound shape or dimension. The 2 small areas she had inferiorly are tiny but still open they seem improved this well. There is no uncontrolled edema and I don't think there is any evidence of cellulitis. 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 Patient has had vascular evaluations both venous and arterial. I have biopsied this area. There is no obvious infection. The worrisome thing today is that she seems to be developing areas of erythema and epithelial damage on the medial aspect of the right foot. Also to a lesser degree inferior to the actual wound itself. Again I see no obvious changes to suggest cellulitis however as this is the only treatable option I will probably give her antibiotics. 07/13/16 no major change in the lady's large anterior right leg wound. Still covered with a very tightly adherent surface slough which is difficult to debridement. There is less erythema around this, culture last week grew pseudomonas I gave her ciprofloxacin. The area on her lateral right malleolus looks better- 08/02/16 the patient's wounds continued to decline. Her original large anterior right leg wound looks deeper. Still adherent surface slough that is difficult to debridement. She has a small area just below this and a punched-out wound just below her lateral malleolus.  In the meantime she is been in hospital with apparently an upper GI bleed on Plavix and aspirin. She is now just on Plavix she received 3 units of packed cells 08/23/16; since I last saw this 3 weeks ago, the open large area on her right leg looks about the same syrup. She has a small satellite lesion just underneath this. The area on her medial right ankle is now a deep necrotic wound. I attempted to debridement this however there is just too much pain. 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 She follows with vascular surgery and is scheduled for an angiogram in early September 09/06/16; the patient is going for an arteriogram tomorrow. Her original large wound on the right calf is about the same the satellite lesion underneath it is about the same however the area on her medial ankle is now deeper with exposed tendon. I am no longer attempting to debride these wounds 09/20/16; the patient has undergone a right femoral endarterectomy and Dacron patch angioplasty. This seems to have helped the flow in her right leg. 10/04/16; Arrived today for aggressive debridement of the wounds on her right calf the original wound the one  beneath it and a difficult area over her right lateral leg just above the lateral malleolus. 10/25/16; her 3 open wounds are about the same in terms of dimensions however the surface appears a lot healthier post debridement. Using Iodoflex 10/18/16 we have been using Iodoflex to her wounds which she tolerated with some difficulty. 10/11/16; has been using Santyl for a period of time with some improvement although again very adherent surface slough would prevent any attempted healing this. She has a original wound on the left calf, the satellite underneath that and the most recent wound on the right medial ankle. She has completed revascularization by Dr. early and has had venous ablation earlier. Want to go back  to Iodoflex to see if week and get a healthier surface to this wound bed failing this I think she'll need to be taken to the OR and I am prepared to call Dr. Marla Roe to discuss this. She is obviously not a good candidate for general anesthesia however.; 11/08/16; I put her on Iodoflex last time to see if I can get the wound bed any healthier and unfortunately today still had tremendous surface slough. 11/15/16; 4 weeks' worth of Iodoflex with not much improvement. Debridement on the major wounds on her left anterior leg is easier however this does not maintain from week to week. The punched-out area on her medial right ankle 11/29/16; I attempted to change the patient last visit to [facility] however she states this burned and was very uncomfortable therefore we gave her permission to go back to the eye out of complex which she already had at home. Also she noted a lot of pain and swelling on the lateral aspect of her leg before she traveled to [facility] for the holidays, I called her in doxycycline over the phone. This seems to have helped 12/06/16; Wounds unchanged by in large. Using Iodoflex 12/13/16; her wounds today actually looks somewhat better. The area on the right lateral lower leg has reasonably healthy-looking granulation and perhaps as actually filled and a bit. Debridement of the 2 wounds on the medial calf is easier and post debridement appears to have a healthier base. We have referred her to Dr. Migdalia Dk for consideration of operative debridement 12/20/16; we have a quick note from Dr. Merri Ray who feels that the patient needs to be referred to an academic center/plastic surgeon. This is due to the complexity of the patient's medical issues as it applies to anything in the OR. We have been using Iodoflex 12/27/16; in general the wounds on the right leg are better in terms of the difficult to remove surface slough. She has been using Iodoflex. She is  approved for Apligraf which I anticipated ordering in the next week or 2 when we get a better-looking surface 01/04/16 the deep wounds on the right leg generally look better. Both of them are debrided further surface slough. The area on the lateral right leg was not debrided. She is approved for Apligraf I think I'll probably order this either next week or the week after depending on the surface of the wounds superiorly. We have been using Iodoflex which will continue until then 01/11/16; the deep wounds on the right leg again have a surface slough that requires debridement. I've not been able to get the wound bed on either one of these wounds down to what would be acceptable for an advanced skin stab-like Apligraf. The area on the medial leg has been improving. We have been using hot  Iodoflex to all wounds which seems to do the best at at least limiting the nonviable surface 01/24/17; we have continued Iodoflex and all her wound areas. Her debridement Gen. he is easier and the surface underneath this looks viable. Nevertheless these are large area wounds with exposed muscle at least on the anterior parts. We have ordered Apligraf's for 2 weeks from now. The patient will be away next week 02/07/17; the patient was close to have first Apligraf today however we did not order one. I therefore replaced her Iodoflex. She essentially has 3 large punched- out areas on her right anterior leg and right medial ankle. 02/11/17; Apligraf #1 02/25/17; Apligraf #2. In general some improvement in the right medial ankle and right anterior leg wounds. The larger wound medially perhaps some better 03/11/17; Apligraf #3. In general continued improvement in the right medial ankle and the right anterior leg wounds. 03/25/17 Apligraf #4. In general continued improvement especially on the right medial ankle and the lower 04/08/17; Apligraf #5 in general continued improvement in all wound sites. 04/22/17; post Apligraf #5 her wound  beds continued to look a lot better all of them up to the surface of the surrounding skin. Had a caramel-colored slough that I did not debrided in case there is residual Apligraf effect. The wounds are as good as I have seen these looking quite some time. 04/29/17; we applied Perry and last week after completing her Apligraf. Wounds look as though they've contracted somewhat although they have a nonviable surface which was problematic in the past. Apparently she has been approved for further Apligraf's. We applied Iodosorb today after debridement. 05/06/17; we're fortunate enough today to be able to apply additional Apligraf approved by her insurance. In general all of her wounds look better Apligraf #6 05/24/17; Apligraf #7 continued improvement in all wounds 3 06/10/17 Apligraf #8. Continued improvement in the surface of all wounds. Not much of an improvement in dimensions as I might a follow 06/24/17 Apligraf #9 continued general improvement although not as much change in the wound areas I might of like. She has a new open area on the right anterior lateral ankle very small and superficial. She also has a necrotic wound on the tip of her right index finger probably secondary to severe uncontrolled Raynaud's phenomenon. She is already on sildenafil and already seen her rheumatologist who gave her Keflex. 07/08/17; Apligraf #10. Generalized improvement although she has a small additional wound just medial to the major wound area. 07/22/17; after discussion we decided not to reorder any further Apligraf's although there is been considerable improvement with these it hasn't been recently. The major wound anteriorly looks better. Smaller wounds beneath this and the more recent one and laterally look about the same. The area on the right lateral lower leg looks about the same 07/29/17 this is a patient who is exceedingly complex. She has advanced scleroderma, crest syndrome including calcinosis, PAD status  post revascularization, chronic venous insufficiency status post ablations. She initially presented to this clinic with wounds on her bilateral lower legs however these closed. More recently we have been dealing with a large open area superiorly on the right anterior leg, a smaller wound underneath this and then another one on the right medial lower extremity. These improve significantly with 10 Apligraf applications. Over the last 2-3 weeks we are making good progress with Hydrofera Blue and these seem to be making progress towards closure 08/19/17; wounds continued to make good improvement with Hydrofera Blue and  episodic aggressive debridements 08/26/17; still using Hydrofera Blue. Good improvements 09/09/17; using Hydrofera Blue continued improvement. Area on the lateral part of her right leg has only a small remaining open area. The small inferior satellite region is for all intents and purposes closed. Her major wound also is come in in terms of depth and has advancing epithelialization. 09/16/17; using Hydrofera Blue with continued improvement. The smaller satellite wound. We've closed out today along with a new were satellite wound medially. The area on her medial ankle is still open and her major wound is still open but making improvement. All using Hydrofera Blue. Currently 09/30/17; using Hydrofera Blue. Still a small open area on the lateral right ankle area and her original major wound seems to be making gradual and steady improvement. 10/14/17; still using Hydrofera Blue. Still too small open areas on the right lateral ankle. Her original major wound is horizontal and linear. The most problematic area paradoxically seems to be the area to the medial area wears I thought it would be the lateral. 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 10/28/17; still using Hydrofera Blue. Right lateral ankle has a very small open area superiorly on the most  lateral part of the wound. Her original open wound has 2 open areas now separated by normal skin and we've redefined this. 11/11/17; still using Hydrofera Blue area and right lateral ankle continues to have a small open area on mostly the lateral part of the wound. Her original wound has 2 small open areas now separated by a considerable amount of normal skin 11/28/17; the patient called in slightly before Thanksgiving to report pain and erythema above the wound on the right leg. In the past this is responded well to treatment for cellulitis and I gave her over the phone doxycycline. She stated this resulted in fairly abrupt improvement. We have been using Hydrofera Blue for a prolonged period of time to the larger wound anteriorly into the remaining wound on her right right lateral ankle. The latter is just about closed with only a small linear area and the bottom of the [place]. 12/02/17; use endoform and left the dressing on since last visit. There is no tenderness and no evidence of infection. 12/16/17; patient has been using Endoform but not making much progress. The 2 punched out open areas anteriorly which were the reminiscence of her major wound appear deeper. The area on the lateral aspect of her right calf also appears deeper. Also she has a puzzling tender swelling above her wound on the right leg. This seems larger than last time. Just above her wounds there appears to be some fluctuance in this area it is not erythematous and there is no crepitus 12/30/17; patient has been using Endoform up until last week we used Hydrofera Blue. Ultrasound of the swelling above her 2 major wounds last time was negative for a fluid collection. I gave her cefaclor for the erythema and tenderness in this area which seems better. Unfortunately both punched out areas anteriorly and the area on her right medial lower leg appear deeper. In fact the lateral of the wounds anteriorly actually looks as though it has  exposed tendon and/or muscle sheath. She is not systemically unwell. She is complaining of vaginitis type symptoms presumably Candida from her antibiotics. 01/06/18; we're using santyl. she has 2 punched out areas anteriorly which were initially part of a large wound. Unfortunately medially this is now open to tendon/muscle. All  the wounds have the same adherent very difficult to debride surface. 01/20/18; 2 week follow-up using Santyl. She has the 2 punched out areas anteriorly which were initially part of her large surface wound there. Medially this still has exposed muscle. All of these have the same tightly adherent necrotic surface which requires debridement. PuraPly was not accepted by the patient's insurance however her insurance I think it changed therefore we are going to run Apligraf to gain 02/03/18; the patient has been using Santyl. The wound on the right lateral ankle looks improved and the 2 areas anteriorly on the right leg looks about the same. The medial one has exposed muscle. The lateral 1 requiresdebridement. We use PuraPly today for the first week 02/10/18; PuraPly #2. The patient has 3 wounds. The area on the right lateral ankle, 2 areas anteriorly that were part of her original large wound in this area the medial one has exposed muscle. All of the wounds were lightly debrided with a number 3 curet. PuraPly #2 applied the lateral wound on the calf and the right lateral ankle look better. 02/17/18; PuraPly #3. Patient has 3 wounds. The area on the right lateral ankle in 2 areas internally that were part of her original large wound. The lateral area has exposed muscle. She arrived with some complaints of pain around the right ankle. 02/24/18; PuraPly #4; not much change in any of the 3 wound areas. Right lateral ankle, right lateral calf. Both of these required debridement with a #3 curet. She tolerates this marginally. The area on the medial leg still has exposed muscle. Not much change  in dimensions 03/03/18 PuraPly #5. The area on the medial ankle actually looks better however the 2 separate areas that were original parts of the larger right anterior leg wound look as though they're attempting to coalesce. 03/10/18; PuraPly #6. The area on the medial ankle actually continues to look and measures smaller however the 2 separate areas that were part of the original large wound on the right anterior leg have now coalesced. There hasn't been much improvement here. The lateral area actually has underlying exposed muscle 03/17/18-she is here in follow-up evaluation for ulcerations to the right lower extremity. She is voicing no complaints or concerns. She tolerated debridement. Puraply#7 placed 03/24/18; difficult right lower extremity ulcerations. PuraPly #8 place. She is been approved for [employer]. She did very well with Apligraf today however she is apparently reached her "lifetime max" 03/31/18; marginal improvement with PuraPly although her wounds looked as good as they have in several weeks today. Used TheraSkins #1 04/14/18 TheraSkin #2 today 04/28/18 TheraSkins #3. Wound slightly improved 05/12/18; TheraSkin skin #4. Wound response has been variable. 05/27/18 TheraSkin #5. Generally improvement in all wound areas. I've also put her in 3 layer compression to help with the severe venous hypertension 06/09/18; patient is done quite well with the TheraSkins unfortunately we have no further applications. I also put her in 3 layer compression last week and that really seems to of helped. 06/16/18; we have been using silver collagen. Wounds are smaller. Still be open area to the muscle layer of her calf however even that is contracted somewhat. She tells me that at night sometimes she has pain on the right lateral calf at the site of her lower wound. Notable that I put her into 3 layer compression about 3 weeks ago. She states that she dangles her leg over the bed that makes it feel better but  she does not describe claudication  during the day She is going to call her secondary insurance to see if they will continue to cover advanced treatment products I have reviewed her arterial studies from 01/22/17; this showed an ABI in the right of 1 and on the left noncompressible. TBI on the right at 0.30 on the left at 0.34. It is therefore possible she has significant PAD with medial calcification falsely elevating her ABI into the normal range. I'll need to be careful about asking her about this next week it's possible the 3 layer compression is too much 06/23/18; was able to reapply TheraSkin 1 today. Edema control is good and she is not complaining of pain no claudication 07/07/18;no major change. New wound which was apparently a taper removal injury today in our clinic between her 2 wounds on the right calf TheraSkins #2 07/14/18; I think there is some improvement in the right lateral ankle and the medial part of her wound. There is still exposed muscle medially. 07/28/18; two-week follow-up. TheraSkins#3. Unfortunately no major change. She is not a candidate I don't think for skin grafting due to severe venous hypertension associated with her scleroderma and pulmonary hypertension 08/11/18 Patient is here today for her [employer] application #38 (#5 of the second set). She seems to be doing well and in the base of the wound appears to show some progress at this point. This is the last approved Theraskin of the second set. 08/25/18; she has completed TheraSkin. There has been some improvement on the right lateral calf wound as well as the anterior leg wounds. The open area to muscle medially on the anterior leg wound is smaller. I'm going to transition her back to [name] under Kerlix Coban change every second day. She reports that she had some calcification removed from her right upper arm. We have had previous problems with calcifications in her wounds on her legs but that has not happened  recently 09/08/18;using Hydrofera Blue on both her wound areas. Wounds seem to of contracted nicely. She uses Kerlix Coban wrap and changes at home herself 09/22/2018; using Hydrofera Blue on both her wound areas. Dimensions seem to have come down somewhat. There is certainly less depth in the medial part of the mid tibia wound and I do not think there is any exposed muscle at this point. 10/06/2018; 2-week follow-up. Using [facility] on her wound areas. Dimensions have come down nicely both on the right lateral ankle area in the right mid tibial area. She has no new complaints 10/20/2018; 2-week follow-up. She is using Hydrofera Blue. Not much change from the last time she was here. The area on the lateral ankle has less depth although it has raised edges on one side. I attempted to remove as much of the raised edge as I could without creating more additional wounds. The area on the right anterior mid tibia area looks the same. 11/03/2018; 2-week follow-up she is using Hydrofera Blue. On the right anterior leg she now has 2 wounds separated by a large area of normal skin. The area on the medial part still has I think exposed muscle although this area itself is a lot smaller. The area laterally has some depth. Both areas with necrotic debris. The area on her right lateral ankle has come in nicely 11/17/2018; patient continues to use Hydrofera Blue. We have been increasing separation of the 2 wounds anteriorly which were at one point joint the area on the right lateral calf continues to have I think some improvement in  depth. 12/08/2018; patient continues to use Hydrofera Blue. There is some improvement in the area on the right lateral calf. The 2 areas that were initially part of the original large wound in the mid right tibia are probably about the same. In fact the medial area is probably somewhat larger. We will run puraply through the patient's insurance 12/22/2018; she has been using Hydrofera  Blue. We have small wounds on the right lateral calf and 2 small areas that were initially part of a large wound in the right mid tibia. We applied pure apply #1 today. 12/29/2018; we applied puraply #2. Her wounds look somewhat better especially on the right lateral calf and the lateral part of her original wound in the mid tibial area. 01/05/2019; perhaps slightly improved in terms of wound bed condition but certainly not as much improvement as I might of liked. Puraply #3 1/13: we did not have a correct sized puraply to apply. wounds more pinched out looking, I increased her compression to 3 layer last week to help with significant multilevel venous hypertension. Since then I've reviewed her arterial status. She has a right femoral endarterectomy and a distal left SFA stent. She was being followed by Dr. [name] for a period however she does not appear to have seen him in 3 years. I will set up an appointment. 1/20. The patient has an additional wound on the right lateral calf between the distal wound and proximal wounds. We did not have Puraply last week. Still does not have a follow-up with Dr. early 1/27: Follow-up with Dr. early has been arranged apparently with follow-up noninvasive studies. Wounds are measuring roughly the same although they certainly look smaller 2/3; the patient had non invasive studies. Her ABIs on the right were 0.83 and on the left 1.02 however there was no great toe pressure bilaterally. Also worrisome monophasic waveforms at the PTA and dorsalis pedis. We are still using Puraply. We have had some improvement in all of the wounds especially the lateral part of the mid tibial area. 2/10; sees Dr. early of vein and vascular re-arterial studies next week. Puraply reapplied today. 2/17; Dr. early of vein and vascular his appointment is tomorrow. Puraply reapplied after debridement of all wounds 2/24; the patient saw Dr. early I reviewed his note. sHe noted the previous right  femoral endarterectomy with a Dacron patch. He also noted the normal ABI and the monophasic waveforms suggesting tibial disease. Overall he did not feel that she had any evidence of arterial insufficiency that would impair her wound healing. He did note her venous disease as well. He suggested PRN follow-up. 3/2; I had the last puraply applied today. The original wounds over the mid tibia area are improved where is the area on the right lower leg is not 3/9; wounds are smaller especially in the right mid tibia perhaps slightly in the right lateral calf. We finished with puraply and went to endoform today 3/23; the patient arrives after 2 weeks. She has been using endoform. I think all of her wounds look slightly better which includes the area on the right lateral calf just above the right lateral malleolus and the 2 in the right mid tibia which were initially part of the same wound. 4/27; TELEHEALTH visit; the patient was seen for telehealth visit today with her consent in the middle of the worldwide epidemic. Since she was last here she called in for antibiotics with pain and tenderness around the area on the right medial ankle. I  gave her empiric doxycycline. She states this feels better. She is using endoform on both of these areas 5/11 TELEHEALTH; the patient was seen for telehealth visit today. She was accompanied at home by her husband. She has severe pulmonary hypertension accompanied scleroderma and in the face of the Covid epidemic cannot be safely brought into our clinic. We have been using endoform on her wound areas. There are essentially 3 wound areas now in the left mid tibia now 2 open areas that it one point were connected and one on the right lateral ankle just above the malleolus. 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 She cannot come to clinic. The wounds were reviewed today via telehealth. She has severe chronic venous hypertension which I think is centrally mediated. She has wounds on her right anterior tibia and right lateral ankle area. These are chronic. She has been using endoform. 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 We have been following her from telehealth. She has severe chronic venous hypertension which may be mostly centrally mediated secondary to right heart heart failure. She has wounds on her right anterior tibia and right lateral ankle these are chronic we have been using endoform 6/22; TELEHEALTH; this patient was seen today via telehealth. 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 Since the last time we had contact with this patient she developed some pain and erythema around the wound on her right lateral malleolus/ankle and we put in antibiotics for her. This is resulted in good improvement with resolution of the erythema and tenderness. I changed her to silver alginate last time. We had been using endoform for an extended period of time 7/13; TELEHEALTH; this patient was seen today via telehealth. 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 She would continue to be at a prohibitive risk to be brought into our clinic unless this was absolutely necessary. These visits have been done with her approval as well as her husband. We have been using silver alginate to the areas on the right mid tibia and right lateral lower leg. 7/27 TELEHEALTH; patient was seen along with her husband today via telehealth. 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 We changed her to sample last visit. She has 2 areas a chronic wound on her right mid tibia and one just above her ankle. These were not the original wounds when she came into this clinic but she developed them during treatment 8/17; she comes in for her first face-to-face visit in a long period. She has a remaining area just medial to the right tibia which is the last open part of her large wound across this area. She also has an area on the right lateral lower leg. We prescribed Santyl last telehealth visit but they were concerned that this was making a deeper so they put silver alginate on it last week. Her husband changes the dressings. 8/31; using Santyl to the 2 wound areas some improvement in wound surfaces. Husband has surgery in 2 weeks we will put her  out 3 weeks. Any of the advanced treatment options that I can think of that would be eligible for this wound would also cause her to have to come in weekly. The risk that the patient is just too high 9/21. Using Santyl to the 2 wound areas. Both of these are somewhat better although the medial mid tibia area still has exposed muscle. Lateral ankle requiring debridement. Using Santyl 10/12; using Santyl to the 2 wound areas. One on the right lateral ankle and the other in the medial calf which still has exposed muscle. Both areas have come down in size and have better looking surfaces. She has made nice progress with santyl 11/2; we have been using Santyl to the 2 wound areas. Right lateral ankle and the other in the mid tibia area the medial part of this still has exposed muscle. 11/23/2019 on evaluation today patient appears to be doing about the same really with regard to her wounds. She is actually not very pleased with how things seem to be progressing at this point she tells me that she really has not noted much improvement unfortunately. With that being said there is no signs of active infection at this time. There is some  slough buildup noted at this time which again along with some dry skin around the edges of the wound I think would benefit her to try to debride some of this away. Fortunately her pain is doing fairly well. She still has exposed muscle in the right medial/tibial area. 12/14; TELEHEALTH; she was changed to [facility] to the right calf wound and right lateral ankle wound when she was here last time. Unfortunately since then she had a fall with a pelvic fracture and a fracture of her wrist. She was apparently hospitalized for 5 days. I have not looked at her discharge summary. She apparently came out of the hospital with a blister on her right heel. She was seen today via telehealth by myself and our case manager. The patient and her husband were present. She has been using Hydrofera Blue at our direction from the last time she was in the clinic. There is been no major improvement in fact the areas appear deeper and with a less viable surface 01/04/2020; TELEHEALTH; the patient was seen today in accompaniment of her husband and our nurse. She has 3 open areas 1 on the right medial mid tibia, one on the right lateral ankle and a large eschared pressure area on the right heel. We have been using Iodoflex to the 2 original wounds. 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 It is simply too perilous for her to be seen in any other way 1/26; TELEHEALTH; the patient was seen today in accompaniment of her husband and our RN one of our nurses. She still has the 3 open areas 1 on the right medial mid tibia which is the remanent of a more extensive wound in this area, one on the right lateral ankle and a large eschared pressure area on the right heel. We have been using Iodoflex to the 2 original wounds and a bed at 9 application to the eschared area on the heel 2/15; the first time we have seen this patient and then several months out of concern for the pandemic. She had a large  horizontal wound in the mid tibia. Only the medial aspect of this is still open. Area on the lateral ankle is just about closed. She had a new pressure ulcer on  the right heel which I have removed some of the eschar. We have been using Iodoflex which I will continue. The area in the mid tibia has a round circle in the middle of exposed muscle. I think we would have to use an advanced treatment product to stimulate granulation over this area. We will run this through her insurance. She is not eligible for plastic surgery for many reasons 3/1; TELEHEALTH. The patient was seen today by telehealth. 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 We have been using Iodoflex to the wound areas which include the right anterior mid tibia, right lateral ankle and the right heel. All of these were reviewed 3/8; the patient's wound just above the right ankle is closed. She still has a contracting black eschar on the heel where she had a pressure ulcer. The medial part of her original wound on the mid tibia has exposed muscle. We have made really made no progress in this area although we have managed to get a lot of the original wound in this area to close. We used Apligraf #1 today 3/22; the patient's ankle wound remains closed. She still has a contracting black eschar on the heel although it seems to have less surface area. It still not clear whether there is any depth here. We used Apligraf #2 today in an attempt to get granulation over the exposed muscle and what is left of her mid tibia area. 4/5; everything is closed except the medial aspect of the mid tibia wound, as well as the pressure injury on the right heel. She has been using Betadine to the right heel which has been gradually contracting. We used Apligraf #3 today. 4/19; Apligraf #4. Still a pressure area on the right heel she has been using Betadine  superficial excoriated areas she has been applying salicylic acid to on the anterior leg and the thick horn on the leg just above her wound area 5/3; Apligraf #5. Unfortunately she came in today with a reopening of the area on the right lateral lower leg. Not much change in the wound we have been treating in the mid calf. Quite a bit of edema surrounding this wound. She reports that she was up on her feet quite a bit. The area on the right heel is separating she has been using Betadine She comes in with a new wound on the left lateral malleolus which is very disappointing this is been open since last week 5/17; we applied her last Apligraf last time. Unfortunately that did not have any effect on the deep area medially in the mid tibial area. However the area over the right lateral malleolus is a lot better. Right heel also is contracted we used Iodoflex last time. The new wound on the left lateral malleolus were using Santyl to. This required debridement. Electronic Signature(s) Signed: 05/16/2020 4:51:55 PM By: Linton Ham MD Entered By: Linton Ham on 05/16/2020 13:12:38 -------------------------------------------------------------------------------- Physical Exam Details Patient Name: Date of Service: [name], [name] 05/16/2020 11:00 A M Medical Record Number: [id number] Patient Account Number: [id number] Date of Birth/Sex: Treating RN: [birth year] (69 y.o. [name] Primary Care Provider: [name], [place] RY Other Clinician: Referring Provider: Treating Provider/Extender: [name], [place] RY Weeks in Treatment: 425 Constitutional Sitting or standing Blood Pressure is within target range for patient.. Pulse regular and within target range for patient.Marland Kitchen Respirations regular, non-labored and  within target range.. Temperature is normal and within the target range for the patient.Marland Kitchen Appears in no distress. Notes Wound exam Midportion of the tibia medially absolutely  no change this did not really respond to the Apligraf which is disappointing still exposed muscle Reopening of the right lateral malleolus is a lot better. Right heel has contracted Left lateral malleolus small punched out wound. Necrotic surface. I used a #3 curette to remove some of this I am still not able to get to a viable surface Electronic Signature(s) Signed: 05/16/2020 4:51:55 PM By: Linton Ham MD Entered By: Linton Ham on 05/16/2020 13:13:43 -------------------------------------------------------------------------------- Physician Orders Details Patient Name: Date of Service: [name], [name] 05/16/2020 11:00 A M Medical Record Number: [id number] Patient Account Number: [id number] Date of Birth/Sex: Treating RN: [birth year] (69 y.o. 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 Nurse Visit: - 1 week for rewrap Dressing Change Frequency Do not change entire dressing for one week. - right leg/heel Wound #20 Left,Lateral Malleolus Change dressing every day. Skin Barriers/Peri-Wound Care Moisturizing lotion - both legs Wound Cleansing May shower with protection. Wound #20 Left,Lateral Malleolus May shower and wash wound with soap and water. Primary Wound Dressing Wound #16 Right  Calcaneus Iodoflex Wound #19 Right,Lateral Malleolus Silver Collagen - moisten with hydrogel Wound #20 Left,Lateral Malleolus Santyl Ointment Wound #5 Right,Medial Lower Leg Silver Collagen - moisten with hydrogel Secondary Dressing Wound #16 Right Calcaneus Dry Gauze Heel Cup Wound #19 Right,Lateral Malleolus daptic Dressing - secure with steri strips A Dry Gauze Wound #20 Left,Lateral Malleolus Foam Border Wound #5 Right,Medial Lower Leg daptic Dressing - secure with steri strips A Dry Gauze Edema Control 3 Layer Compression System - Right Lower Extremity Avoid standing for long periods of time Elevate legs to the level of the heart or above for 30 minutes daily and/or when sitting, a frequency of: - throughout the day Off-Loading Turn and reposition every 2 hours Other: - float heels off of bed/chair with pillow under calves Electronic Signature(s) Signed: 05/16/2020 4:51:55 PM By: Linton Ham MD Signed: 05/16/2020 5:54:52 PM By: Levan Hurst RN, BSN Entered By: Levan Hurst on 05/16/2020 12:42:39 -------------------------------------------------------------------------------- Problem List Details Patient Name: Date of Service: [name], [name] 05/16/2020 11:00 A M Medical Record Number: [id number] Patient Account Number: [id number] Date of Birth/Sex: Treating RN: Jul 05, 1951 (69 y.o. 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 02/24/2015 No Yes I87.331 Chronic  venous hypertension (idiopathic) with ulcer and  inflammation of right 10/04/2016 No Yes lower extremity L89.616 Pressure-induced deep tissue damage of right heel 12/14/2019 No Yes Inactive Problems ICD-10 Code Description Active Date Inactive Date 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 05/16/2020 11:00 A M Medical Record Number: [id number] Patient Account Number: [id number] Date of Birth/Sex: Treating RN: [birth year] (69 y.o. [name] Primary Care Provider: [name], [place] RY Other Clinician: Referring Provider: Treating Provider/Extender: [name], MA RY Weeks in Treatment: 425 Subjective History of Present Illness (HPI) this is a patient who initially came to [place] for wounds on the medial malleoli bilaterally as well as her upper medial lower extremities bilaterally. These wounds eventually healed with assistance of Apligraf's bilaterally. While this was occurring she developed the current wound which opened into a fairly substantial wound on the right lower extremity. These are mostly secondary to venous stasis physiology however the patient also has underlying scleroderma, pulmonary hypertension. The wound has been making good progress lately with the Hydrofera Blue-based dressing. 03/17/2015; patient had a arterial evaluation a year ago. Her right ABI was 0.86 left was 1.0. T brachial index was 0.41 on the right 0.45 on the left. Her oe bilateral common femoral artery waveforms were triphasic. Her white  popliteal posterior tibial artery and anterior tibial artery waveforms were monophasic with good amplitude. Luteal artery waveforms were biphasic it was felt that her bilateral great toe pressures are of normal although adequate for tissue healing. 03/24/2015. The condition of this wound is not really improved that. He was covered with as fibrinous surface slough and eschar. This underwent an aggressive debridement with both a curette and scalpel. I still cannot really get down to what I can would consider to be a viable surface. There is no evidence of infection. Previous workup for ischemia roughly a year ago was negative nevertheless I think that continues to be a concern 04/07/15. The patient arrived for application of second Apligraf. Once again the surface of this wound is certainly less viable than I would like for an advanced treatment option. An aggressive debridement was done. She developed some arteriolar bleeding which required that along pressure and silver alginate. 04/21/15: change in the condition of this wound. Once again it is covered in a gelatinous surface slough. After debridement today surface of the wound looks somewhat better but now a heeling surface 05/05/15 Apligraf #4 applied.Still a lot of slough on this wound. 05/19/15 Apligraf #5 applied. Still a lot of slough on this wound I did not aggressively remove this 06/02/15; continued copious amounts of surface slough. This debridement fairly easily. 06/08/2015 -- the last time she had a venous duplex study done was over 3 years ago and the surgery was prior to that. I have recommended that she sees Dr. early for a another opinion regarding a repeat venous duplex and possibly more endovenous ablation of vein stripping of micro-phlebotomies. 06/16/15; wound has a gelatinous surface eschar that the debridement fairly easily to a point. I don't disagree with the venous workup and perhaps even arterial re-evaluation. She is on prednisone 5  mg and continue his medications for her pulmonary artery hypertension I am not sure if the latter have  any wound care healing issues I would need to investigate this. 06/23/15 continues with a gelatinous surface eschar with of fibrinous underlying. What I can see of this wound does not look unhealthy however I just can't get this material which I think is 2 different layers off. Empiric culture done 06/30/15; unfortunately not a lot of change in this wound. A gelatinous surface eschar is easily removed however it has a tight fibrinous surface underneath the. 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 The wound comes back and basically and unchanged state the. She has a gelatinous surface that is more easily removed however there is a tightly fibrinous surface underneath the. There is no evidence of infection. She has a vascular follow-up next month. I would have to inject her in order to do a more aggressive debridement of this area 07/21/15 the wound is roughly in the same state albeit the debridement is done with greater ease. There is less of the fibrinous eschar underneath the. There is no evidence of infection. She has follow-up with vascular surgery next week. No evidence of surrounding infection. Her original distal wounds healed while this one formed. 07/28/15; wound is easier to debride. No wound erythema. She is seeing Dr. [name] next week. 08/18/15 Has been to [place] for repeat ablation. Have been using medihoney pad with some improvement 08/25/15; absolutely no change in the condition of this wound in either its overall size or surface condition in many months now. At one point I had this down to [place] healthier surface I think with [facility] however this did not actually progress towards closure. Do not believe that the wound has actually deteriorated in terms of volume at all. We have been using a medihoney pad which allows easier debridement of the gelatinous eschar  but again no overall actual improvement. the patient is going towards an ablation with pain and vascular which I think is scheduled for next week. The only other investigation that I could first see would be a biopsy. She does have underlying scleroderma 09/02/15 eschar is much easier to debridement however the base of this does not look particularly vibrant. We changed to Iodoflex. The patient had her ablation earlier this week 09/09/15 again the debridement over the base of this wound is easier and the base of the wound looks considerably better. We will continue the Iodoflex. Dr. Tawni Millers has expressed his satisfaction with the result of her ablation 09/15/15 once again the wound is relatively free of surface eschar. No debridement was done today. It has a pale-looking base to it. although this is not as deep as it once was it seems to be expanding especially inferiorly. She has had recent venous ablation but this is no closer to healing.I've gone ahead and done a punch biopsy this from the inferior part of the wound close to normal skin 09/22/15: the wound is relatively free of surface eschar. There is some surrounding eschar. I'm not exactly sure at what level the surface is that I am seeing. 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 No evidence of atypical infection, malignancy. 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 No debridement was done 09/29/15; the wound surface is relatively free of surface eschar. There is eschar surrounding the walls of the wound. Aside from the improvement in the amount of surface slough. This wound has not progressed any towards closure. There is not even a surface that looks like there at this  is ready. There is no evidence of any infection nor maligancy based on biopsy I did on 9/15. I continued with the Iodoflex however I am looking towards some alternative to try to promote some closure or filling in of this  surface. Consider triple layer Oasis. Collagen did not result in adequate control of the surface slough 10/13/15; the patient was in hospital last week with severe anemia. The wound looks somewhat better after debridement although there is widening medially. There is no evidence of infection. 10/20/15; patient's wound on the right lateral lower leg is essentially unchanged. This underwent a light surface debridement and in general the debridement is easier and the surface looks improved. I noted in doing this on the side of the wound what appears to be a piece of calcium deposition. The patient noted that she had previous things on the tips of her fingers. 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 She follows with rheumatology and I have asked them to talk to her about this. In view of that S the nonhealing ulcer may have something to do with calcinosis and also unrelieved Raynaud's disease in this area. I should note that her original wounds on the right and left medial malleolus and the inner aspects of both legs just below the knees did however heal over 10/30/15; the patient's wound on the right lateral lower leg is essentially unchanged. I was able to remove some calcified material from the medial wound edge. 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 Otherwise the wound appears essentially unchanged there is less adherent surface eschar. Some of the calcified material was sent to pathology for analysis 11/17/15. The patient's wound on the right lateral leg is essentially unchanged. Wider Medially. 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  There is some surface slough but I did not debridement this. No evidence of surrounding infection. 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 This will make healing difficult 12/15/15; the wound has less depth surface has a fibrinous slough and calcifications in the wound edge no evidence of infection 12/22/15; the wound definitely has less Fibrinous slough on the base. Calcifications around the wound edges are still evident. Although the wound bed looks healthier it is still pale in appearance. Previous biopsy did not show malignancy 01/04/15; surgical debridement of nonviable slough and subcutaneous tissue the wound cleans up quite nicely but appears to be expanding outward calcifications around the wound edges are still there. 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 She is to see her rheumatologist I'll see if he has any opinions on this. My punch biopsy done in September did not show calcifications although these are clearly evident. 01/19/16 light selective debridement of nonviable surface slough. There is epithelialization medially. This gives me reason for cautious optimism. She has been to see her rheumatologist, there is nothing that can be done for this type of soft tissue calcification associated with scleroderma 02/02/16 no debridement although there is a light surface slough. She has 2 peninsulas of skin 1 inferiorly and one medially. We continue to make a slight and slow but definite progressive here 02/16/16; light surface  debridement with more attention to the circumference of the wound bed where the fibrinous eschar is more prevalent. No calcifications detected. She seems to have done nicely with the [facility] with some epithelialization and some improvement in the overall wound volume. 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 Continued attention to the circumference of the wound where the fibrinous eschar in calcinosis or prevalent. No calcifications were detected. She has continued improvement with Hydrofera Blue. The wound is no longer as deep 03/15/16 surgical debridement done to remove surface escha Especially around the circumference of the wound where there is nonviable subcutaneous tissue. In spite of this there is considerable improvement in the overall dimensions and depth of the wound. Islands of epithelialization are seen especially medially inferiorly and superiorly to a lesser extent. She is using Hydrofera Blue at home 03/29/16; surgical debridement done to remove surface eschar and nonviable subcutaneous tissue. This cleans up quite nicely mention slightly larger in terms of length and width however depth is less 04/12/16; continued gradual improvement in terms of depth and the condition of the wound base. Debridement is done. Continuing long standing Hydrofera Blue at home with Kerlix Coban wraps 04/26/16; continued gradual improvement in terms of depth and management as well as condition of the wound base. Surgical debridement done she continues with Hydrofera Blue. 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 She initially came to this clinic venous insufficiency ulcers which have long since healed 05/17/16 continued improvement in terms of the depth and measurements of this wound although she has a tightly adherent fibrinous slough each time. We've been continued with long standing Hydrofera Blue which seems to done as well for this wound is many advanced treatment options. 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 She also has chronic venous insufficiency. She has an irritation on her lateral right ankle secondary to our wraps 05/10/16; wound appears to be smaller  especially on the medial aspect and especially in the width. Wound was debridement surface looks better. She is also been in the hospital apparently with anemia again she tells me she had an endoscopy. Since she got home after 3 days which I believe was sometime last week she has had an irritated painful area on the right lateral ankle surrounding the lateral malleolus 05/31/16; much more adherent surface slough today then recently although I don't think the dimensions of changed that much. A more aggressive debridement is required. The irritated area over her medial malleolus is more pruritic and painful and I don't think represents cellulitis 06/14/16; no major change in her wound dimensions however there is more tightly adherent surface slough which is disappointing. As well as she appears to have a new small area medially. Furthermore an irritated uncomfortable area on the lateral aspect of her right foot just below the lateral malleolus. 06/21/16; I'm seeing the patient back in follow-up for the new areas under her major wound on the right anterior leg. She has been using Hydrofera Blue to this area probably for several months now and although the dressings seem to be helping for quite a period of time I think things have stagnated lately. She comes in today with a relatively tight adherent surface slough and really no changes in the wound shape or dimension. The 2 small areas she had inferiorly are tiny but still  open they seem improved this well. There is no uncontrolled edema and I don't think there is any evidence of cellulitis. 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 Patient has had vascular evaluations both venous and arterial. I have biopsied this area. There is no obvious infection. The worrisome thing today is that she seems to be developing areas of erythema and epithelial damage on  the medial aspect of the right foot. Also to a lesser degree inferior to the actual wound itself. Again I see no obvious changes to suggest cellulitis however as this is the only treatable option I will probably give her antibiotics. 07/13/16 no major change in the lady's large anterior right leg wound. Still covered with a very tightly adherent surface slough which is difficult to debridement. There is less erythema around this, culture last week grew pseudomonas I gave her ciprofloxacin. The area on her lateral right malleolus looks better- 08/02/16 the patient's wounds continued to decline. Her original large anterior right leg wound looks deeper. Still adherent surface slough that is difficult to debridement. She has a small area just below this and a punched-out wound just below her lateral malleolus. In the meantime she is been in hospital with apparently an upper GI bleed on Plavix and aspirin. She is now just on Plavix she received 3 units of packed cells 08/23/16; since I last saw this 3 weeks ago, the open large area on her right leg looks about the same syrup. She has a small satellite lesion just underneath this. The area on her medial right ankle is now a deep necrotic wound. I attempted to debridement this however there is just too much pain. 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 She follows with vascular surgery and is scheduled for an angiogram in early September 09/06/16; the patient is going for an arteriogram tomorrow. Her original large wound on the right calf is about the same the satellite lesion underneath it is about the same however the area on her medial ankle is now deeper with exposed tendon. I am no longer attempting to debride these wounds 09/20/16; the patient has undergone a right femoral endarterectomy and Dacron patch angioplasty. This seems to have helped the flow in her right leg. 10/04/16; Arrived  today for aggressive debridement of the wounds on her right calf the original wound the one beneath it and a difficult area over her right lateral leg just above the lateral malleolus. 10/25/16; her 3 open wounds are about the same in terms of dimensions however the surface appears a lot healthier post debridement. Using Iodoflex 10/18/16 we have been using Iodoflex to her wounds which she tolerated with some difficulty. 10/11/16; has been using Santyl for a period of time with some improvement although again very adherent surface slough would prevent any attempted healing this. She has a original wound on the left calf, the satellite underneath that and the most recent wound on the right medial ankle. She has completed revascularization by Dr. early and has had venous ablation earlier. Want to go back to Iodoflex to see if week and get a healthier surface to this wound bed failing this I think she'll need to be taken to the OR and I am prepared to call Dr. Marla Roe to discuss this. She is obviously not a good candidate for  general anesthesia however.; 11/08/16; I put her on Iodoflex last time to see if I can get the wound bed any healthier and unfortunately today still had tremendous surface slough. 11/15/16; 4 weeks' worth of Iodoflex with not much improvement. Debridement on the major wounds on her left anterior leg is easier however this does not maintain from week to week. The punched-out area on her medial right ankle 11/29/16; I attempted to change the patient last visit to [place] however she states this burned and was very uncomfortable therefore we gave her permission to go back to the eye out of complex which she already had at home. Also she noted a lot of pain and swelling on the lateral aspect of her leg before she traveled to [facility] for the holidays, I called her in doxycycline over the phone. This seems to have helped 12/06/16; Wounds unchanged by in large.  Using Iodoflex 12/13/16; her wounds today actually looks somewhat better. The area on the right lateral lower leg has reasonably healthy-looking granulation and perhaps as actually filled and a bit. Debridement of the 2 wounds on the medial calf is easier and post debridement appears to have a healthier base. We have referred her to Dr. Migdalia Dk for consideration of operative debridement 12/20/16; we have a quick note from Dr. Merri Ray who feels that the patient needs to be referred to an academic center/plastic surgeon. This is due to the complexity of the patient's medical issues as it applies to anything in the OR. We have been using Iodoflex 12/27/16; in general the wounds on the right leg are better in terms of the difficult to remove surface slough. She has been using Iodoflex. She is approved for Apligraf which I anticipated ordering in the next week or 2 when we get a better-looking surface 01/04/16 the deep wounds on the right leg generally look better. Both of them are debrided further surface slough. The area on the lateral right leg was not debrided. She is approved for Apligraf I think I'll probably order this either next week or the week after depending on the surface of the wounds superiorly. We have been using Iodoflex which will continue until then 01/11/16; the deep wounds on the right leg again have a surface slough that requires debridement. I've not been able to get the wound bed on either one of these wounds down to what would be acceptable for an advanced skin stab-like Apligraf. The area on the medial leg has been improving. We have been using hot Iodoflex to all wounds which seems to do the best at at least limiting the nonviable surface 01/24/17; we have continued Iodoflex and all her wound areas. Her debridement Gen. he is easier and the surface underneath this looks viable. Nevertheless these are large area wounds with exposed muscle at least on the anterior parts. We  have ordered Apligraf's for 2 weeks from now. The patient will be away next week 02/07/17; the patient was close to have first Apligraf today however we did not order one. I therefore replaced her Iodoflex. She essentially has 3 large punched- out areas on her right anterior leg and right medial ankle. 02/11/17; Apligraf #1 02/25/17; Apligraf #2. In general some improvement in the right medial ankle and right anterior leg wounds. The larger wound medially perhaps some better 03/11/17; Apligraf #3. In general continued improvement in the right medial ankle and the right anterior leg wounds. 03/25/17 Apligraf #4. In general continued improvement especially on the right medial  ankle and the lower 04/08/17; Apligraf #5 in general continued improvement in all wound sites. 04/22/17; post Apligraf #5 her wound beds continued to look a lot better all of them up to the surface of the surrounding skin. Had a caramel-colored slough that I did not debrided in case there is residual Apligraf effect. The wounds are as good as I have seen these looking quite some time. 04/29/17; we applied [place] and last week after completing her Apligraf. Wounds look as though they've contracted somewhat although they have a nonviable surface which was problematic in the past. Apparently she has been approved for further Apligraf's. We applied Iodosorb today after debridement. 05/06/17; we're fortunate enough today to be able to apply additional Apligraf approved by her insurance. In general all of her wounds look better Apligraf #6 05/24/17; Apligraf #7 continued improvement in all wounds o3 06/10/17 Apligraf #8. Continued improvement in the surface of all wounds. Not much of an improvement in dimensions as I might a follow 06/24/17 Apligraf #9 continued general improvement although not as much change in the wound areas I might of like. She has a new open area on the right anterior lateral ankle very small and superficial. She also  has a necrotic wound on the tip of her right index finger probably secondary to severe uncontrolled Raynaud's phenomenon. She is already on sildenafil and already seen her rheumatologist who gave her Keflex. 07/08/17; Apligraf #10. Generalized improvement although she has a small additional wound just medial to the major wound area. 07/22/17; after discussion we decided not to reorder any further Apligraf's although there is been considerable improvement with these it hasn't been recently. The major wound anteriorly looks better. Smaller wounds beneath this and the more recent one and laterally look about the same. The area on the right lateral lower leg looks about the same 07/29/17 this is a patient who is exceedingly complex. She has advanced scleroderma, crest syndrome including calcinosis, PAD status post revascularization, chronic venous insufficiency status post ablations. She initially presented to this clinic with wounds on her bilateral lower legs however these closed. More recently we have been dealing with a large open area superiorly on the right anterior leg, a smaller wound underneath this and then another one on the right medial lower extremity. These improve significantly with 10 Apligraf applications. Over the last 2-3 weeks we are making good progress with Hydrofera Blue and these seem to be making progress towards closure 08/19/17; wounds continued to make good improvement with Hydrofera Blue and episodic aggressive debridements 08/26/17; still using Hydrofera Blue. Good improvements 09/09/17; using Hydrofera Blue continued improvement. Area on the lateral part of her right leg has only a small remaining open area. The small inferior satellite region is for all intents and purposes closed. Her major wound also is come in in terms of depth and has advancing epithelialization. 09/16/17; using Hydrofera Blue with continued improvement. The smaller satellite wound. We've closed out today along  with a new were satellite wound medially. The area on her medial ankle is still open and her major wound is still open but making improvement. All using Hydrofera Blue. Currently 09/30/17; using Hydrofera Blue. Still a small open area on the lateral right ankle area and her original major wound seems to be making gradual and steady improvement. 10/14/17; still using Hydrofera Blue. Still too small open areas on the right lateral ankle. Her original major wound is horizontal and linear. The most problematic area paradoxically seems to be the  area to the medial area wears I thought it would be the lateral. 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 10/28/17; still using Hydrofera Blue. Right lateral ankle has a very small open area superiorly on the most lateral part of the wound. Her original open wound has 2 open areas now separated by normal skin and we've redefined this. 11/11/17; still using Hydrofera Blue area and right lateral ankle continues to have a small open area on mostly the lateral part of the wound. Her original wound has 2 small open areas now separated by a considerable amount of normal skin 11/28/17; the patient called in slightly before Thanksgiving to report pain and erythema above the wound on the right leg. In the past this is responded well to treatment for cellulitis and I gave her over the phone doxycycline. She stated this resulted in fairly abrupt improvement. We have been using Hydrofera Blue for a prolonged period of time to the larger wound anteriorly into the remaining wound on her right right lateral ankle. The latter is just about closed with only a small linear area and the bottom of the [place]. 12/02/17; use endoform and left the dressing on since last visit. There is no tenderness and no evidence of infection. 12/16/17; patient has been using Endoform but not making much progress. The 2 punched out open areas anteriorly  which were the reminiscence of her major wound appear deeper. The area on the lateral aspect of her right calf also appears deeper. Also she has a puzzling tender swelling above her wound on the right leg. This seems larger than last time. Just above her wounds there appears to be some fluctuance in this area it is not erythematous and there is no crepitus 12/30/17; patient has been using Endoform up until last week we used Hydrofera Blue. Ultrasound of the swelling above her 2 major wounds last time was negative for a fluid collection. I gave her cefaclor for the erythema and tenderness in this area which seems better. Unfortunately both punched out areas anteriorly and the area on her right medial lower leg appear deeper. In fact the lateral of the wounds anteriorly actually looks as though it has exposed tendon and/or muscle sheath. She is not systemically unwell. She is complaining of vaginitis type symptoms presumably Candida from her antibiotics. 01/06/18; we're using santyl. she has 2 punched out areas anteriorly which were initially part of a large wound. Unfortunately medially this is now open to tendon/muscle. All the wounds have the same adherent very difficult to debride surface. 01/20/18; 2 week follow-up using Santyl. She has the 2 punched out areas anteriorly which were initially part of her large surface wound there. Medially this still has exposed muscle. All of these have the same tightly adherent necrotic surface which requires debridement. PuraPly was not accepted by the patient's insurance however her insurance I think it changed therefore we are going to run Apligraf to gain 02/03/18; the patient has been using Santyl. The wound on the right lateral ankle looks improved and the 2 areas anteriorly on the right leg looks about the same. The medial one has exposed muscle. The lateral 1 requiresdebridement. We use PuraPly today for the first week 02/10/18; PuraPly #2. The patient has 3  wounds. The area on the right lateral ankle, 2 areas anteriorly that were part of her original large wound in this area the medial one has exposed muscle. All of the wounds were lightly  debrided with a number 3 curet. PuraPly #2 applied the lateral wound on the calf and the right lateral ankle look better. 02/17/18; PuraPly #3. Patient has 3 wounds. The area on the right lateral ankle in 2 areas internally that were part of her original large wound. The lateral area has exposed muscle. She arrived with some complaints of pain around the right ankle. 02/24/18; PuraPly #4; not much change in any of the 3 wound areas. Right lateral ankle, right lateral calf. Both of these required debridement with a #3 curet. She tolerates this marginally. The area on the medial leg still has exposed muscle. Not much change in dimensions 03/03/18 PuraPly #5. The area on the medial ankle actually looks better however the 2 separate areas that were original parts of the larger right anterior leg wound look as though they're attempting to coalesce. 03/10/18; PuraPly #6. The area on the medial ankle actually continues to look and measures smaller however the 2 separate areas that were part of the original large wound on the right anterior leg have now coalesced. There hasn't been much improvement here. The lateral area actually has underlying exposed muscle 03/17/18-she is here in follow-up evaluation for ulcerations to the right lower extremity. She is voicing no complaints or concerns. She tolerated debridement. Puraply#7 placed 03/24/18; difficult right lower extremity ulcerations. PuraPly #8 place. She is been approved for [employer]. She did very well with Apligraf today however she is apparently reached her "lifetime max" 03/31/18; marginal improvement with PuraPly although her wounds looked as good as they have in several weeks today. Used TheraSkins #1 04/14/18 TheraSkin #2 today 04/28/18 TheraSkins #3. Wound slightly  improved 05/12/18; TheraSkin skin #4. Wound response has been variable. 05/27/18 TheraSkin #5. Generally improvement in all wound areas. I've also put her in 3 layer compression to help with the severe venous hypertension 06/09/18; patient is done quite well with the TheraSkins unfortunately we have no further applications. I also put her in 3 layer compression last week and that really seems to of helped. 06/16/18; we have been using silver collagen. Wounds are smaller. Still be open area to the muscle layer of her calf however even that is contracted somewhat. She tells me that at night sometimes she has pain on the right lateral calf at the site of her lower wound. Notable that I put her into 3 layer compression about 3 weeks ago. She states that she dangles her leg over the bed that makes it feel better but she does not describe claudication during the day ooShe is going to call her secondary insurance to see if they will continue to cover advanced treatment products I have reviewed her arterial studies from 01/22/17; this showed an ABI in the right of 1 and on the left noncompressible. TBI on the right at 0.30 on the left at 0.34. It is therefore possible she has significant PAD with medial calcification falsely elevating her ABI into the normal range. I'll need to be careful about asking her about this next week it's possible the 3 layer compression is too much 06/23/18; was able to reapply TheraSkin 1 today. Edema control is good and she is not complaining of pain no claudication 07/07/18;no major change. New wound which was apparently a taper removal injury today in our clinic between her 2 wounds on the right calf TheraSkins #2 07/14/18; I think there is some improvement in the right lateral ankle and the medial part of her wound. There is still exposed muscle medially.  07/28/18; two-week follow-up. TheraSkins#3. Unfortunately no major change. She is not a candidate I don't think for skin grafting  due to severe venous hypertension associated with her scleroderma and pulmonary hypertension 08/11/18 Patient is here today for her [employer] application #29 (#5 of the second set). She seems to be doing well and in the base of the wound appears to show some progress at this point. This is the last approved [employer] of the second set. 08/25/18; she has completed TheraSkin. There has been some improvement on the right lateral calf wound as well as the anterior leg wounds. The open area to muscle medially on the anterior leg wound is smaller. I'm going to transition her back to [facility] under Kerlix Coban change every second day. She reports that she had some calcification removed from her right upper arm. We have had previous problems with calcifications in her wounds on her legs but that has not happened recently 09/08/18;using Hydrofera Blue on both her wound areas. Wounds seem to of contracted nicely. She uses Kerlix Coban wrap and changes at home herself 09/22/2018; using Hydrofera Blue on both her wound areas. Dimensions seem to have come down somewhat. There is certainly less depth in the medial part of the mid tibia wound and I do not think there is any exposed muscle at this point. 10/06/2018; 2-week follow-up. Using [facility] on her wound areas. Dimensions have come down nicely both on the right lateral ankle area in the right mid tibial area. She has no new complaints 10/20/2018; 2-week follow-up. She is using Hydrofera Blue. Not much change from the last time she was here. The area on the lateral ankle has less depth although it has raised edges on one side. I attempted to remove as much of the raised edge as I could without creating more additional wounds. The area on the right anterior mid tibia area looks the same. 11/03/2018; 2-week follow-up she is using Hydrofera Blue. On the right anterior leg she now has 2 wounds separated by a large area of normal skin. The area on the medial  part still has I think exposed muscle although this area itself is a lot smaller. The area laterally has some depth. Both areas with necrotic debris. ooThe area on her right lateral ankle has come in nicely 11/17/2018; patient continues to use Hydrofera Blue. We have been increasing separation of the 2 wounds anteriorly which were at one point joint the area on the right lateral calf continues to have I think some improvement in depth. 12/08/2018; patient continues to use Hydrofera Blue. There is some improvement in the area on the right lateral calf. The 2 areas that were initially part of the original large wound in the mid right tibia are probably about the same. In fact the medial area is probably somewhat larger. We will run puraply through the patient's insurance 12/22/2018; she has been using Hydrofera Blue. We have small wounds on the right lateral calf and 2 small areas that were initially part of a large wound in the right mid tibia. We applied pure apply #1 today. 12/29/2018; we applied puraply #2. Her wounds look somewhat better especially on the right lateral calf and the lateral part of her original wound in the mid tibial area. 01/05/2019; perhaps slightly improved in terms of wound bed condition but certainly not as much improvement as I might of liked. Puraply #3 1/13: we did not have a correct sized puraply to apply. wounds more pinched out looking,  I increased her compression to 3 layer last week to help with significant multilevel venous hypertension. Since then I've reviewed her arterial status. She has a right femoral endarterectomy and a distal left SFA stent. She was being followed by Dr. [name] for a period however she does not appear to have seen him in 3 years. I will set up an appointment. 1/20. The patient has an additional wound on the right lateral calf between the distal wound and proximal wounds. We did not have Puraply last week. Still does not have a follow-up with Dr.  early 1/27: Follow-up with Dr. early has been arranged apparently with follow-up noninvasive studies. Wounds are measuring roughly the same although they certainly look smaller 2/3; the patient had non invasive studies. Her ABIs on the right were 0.83 and on the left 1.02 however there was no great toe pressure bilaterally. Also worrisome monophasic waveforms at the PTA and dorsalis pedis. We are still using Puraply. We have had some improvement in all of the wounds especially the lateral part of the mid tibial area. 2/10; sees Dr. early of vein and vascular re-arterial studies next week. Puraply reapplied today. 2/17; Dr. early of vein and vascular his appointment is tomorrow. Puraply reapplied after debridement of all wounds 2/24; the patient saw Dr. early I reviewed his note. sHe noted the previous right femoral endarterectomy with a Dacron patch. He also noted the normal ABI and the monophasic waveforms suggesting tibial disease. Overall he did not feel that she had any evidence of arterial insufficiency that would impair her wound healing. He did note her venous disease as well. He suggested PRN follow-up. 3/2; I had the last puraply applied today. The original wounds over the mid tibia area are improved where is the area on the right lower leg is not 3/9; wounds are smaller especially in the right mid tibia perhaps slightly in the right lateral calf. We finished with puraply and went to endoform today 3/23; the patient arrives after 2 weeks. She has been using endoform. I think all of her wounds look slightly better which includes the area on the right lateral calf just above the right lateral malleolus and the 2 in the right mid tibia which were initially part of the same wound. 4/27; TELEHEALTH visit; the patient was seen for telehealth visit today with her consent in the middle of the worldwide epidemic. Since she was last here she called in for antibiotics with pain and tenderness around  the area on the right medial ankle. I gave her empiric doxycycline. She states this feels better. She is using endoform on both of these areas 5/11 TELEHEALTH; the patient was seen for telehealth visit today. She was accompanied at home by her husband. She has severe pulmonary hypertension accompanied scleroderma and in the face of the Covid epidemic cannot be safely brought into our clinic. We have been using endoform on her wound areas. There are essentially 3 wound areas now in the left mid tibia now 2 open areas that it one point were connected and one on the right lateral ankle just above the malleolus. 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 She cannot come to clinic. The wounds were reviewed today via telehealth. She has severe chronic venous hypertension which I think is centrally mediated. She has wounds on her right anterior  tibia and right lateral ankle area. These are chronic. She has been using endoform. 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 We have been following her from telehealth. She has severe chronic venous hypertension which may be mostly centrally mediated secondary to right heart heart failure. She has wounds on her right anterior tibia and right lateral ankle these are chronic we have been using endoform 6/22; TELEHEALTH; this patient was seen today via telehealth. 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 Since the last time we had contact with this patient she developed some pain and erythema around the wound on her right lateral malleolus/ankle and we put in antibiotics for  her. This is resulted in good improvement with resolution of the erythema and tenderness. I changed her to silver alginate last time. We had been using endoform for an extended period of time 7/13; TELEHEALTH; this patient was seen today via telehealth. 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 She would continue to be at a prohibitive risk to be brought into our clinic unless this was absolutely necessary. These visits have been done with her approval as well as her husband. We have been using silver alginate to the areas on the right mid tibia and right lateral lower leg. 7/27 TELEHEALTH; patient was seen along with her husband today via telehealth. 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 We changed her to sample last visit. She has 2 areas a chronic wound on her right mid tibia and one just above her ankle. These were not the original wounds when she came into this clinic but she developed them during treatment 8/17; she comes in for her first face-to-face visit in a long period. She has a remaining area just medial to the right tibia which is the last open part of her large wound across this area. She also has an area on the right lateral lower leg. We prescribed Santyl last telehealth visit but they were concerned that this was making a deeper so they put silver alginate on it last week. Her husband changes the dressings. 8/31; using Santyl to the 2 wound areas some improvement in wound surfaces. Husband has surgery in 2 weeks we will put her out 3 weeks. Any of the advanced treatment options that I can think of that would be eligible for this wound would also cause her to have to come in weekly. The risk that the patient is just too high 9/21. Using Santyl to the 2 wound areas. Both of these are somewhat better although the medial mid tibia area still has exposed  muscle. Lateral ankle requiring debridement. Using Santyl 10/12; using Santyl to the 2 wound areas. One on the right lateral ankle and the other in the medial calf which still has exposed muscle. Both areas have come down in size and have better looking surfaces. She has made nice progress with santyl 11/2; we have been using Santyl to the 2 wound areas. Right lateral ankle and the other in the mid tibia area the medial part of this still has exposed muscle. 11/23/2019 on evaluation today patient appears to be doing about the same really with regard to her wounds. She is  actually not very pleased with how things seem to be progressing at this point she tells me that she really has not noted much improvement unfortunately. With that being said there is no signs of active infection at this time. There is some slough buildup noted at this time which again along with some dry skin around the edges of the wound I think would benefit her to try to debride some of this away. Fortunately her pain is doing fairly well. She still has exposed muscle in the right medial/tibial area. 12/14; TELEHEALTH; she was changed to [facility] to the right calf wound and right lateral ankle wound when she was here last time. Unfortunately since then she had a fall with a pelvic fracture and a fracture of her wrist. She was apparently hospitalized for 5 days. I have not looked at her discharge summary. She apparently came out of the hospital with a blister on her right heel. She was seen today via telehealth by myself and our case manager. The patient and her husband were present. She has been using Hydrofera Blue at our direction from the last time she was in the clinic. There is been no major improvement in fact the areas appear deeper and with a less viable surface 01/04/2020; TELEHEALTH; the patient was seen today in accompaniment of her husband and our nurse. She has 3 open areas 1 on the right medial mid tibia, one on  the right lateral ankle and a large eschared pressure area on the right heel. We have been using Iodoflex to the 2 original wounds. 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 It is simply too perilous for her to be seen in any other way 1/26; TELEHEALTH; the patient was seen today in accompaniment of her husband and our RN one of our nurses. She still has the 3 open areas 1 on the right medial mid tibia which is the remanent of a more extensive wound in this area, one on the right lateral ankle and a large eschared pressure area on the right heel. We have been using Iodoflex to the 2 original wounds and a bed at 9 application to the eschared area on the heel 2/15; the first time we have seen this patient and then several months out of concern for the pandemic. She had a large horizontal wound in the mid tibia. Only the medial aspect of this is still open. Area on the lateral ankle is just about closed. She had a new pressure ulcer on the right heel which I have removed some of the eschar. We have been using Iodoflex which I will continue. The area in the mid tibia has a round circle in the middle of exposed muscle. I think we would have to use an advanced treatment product to stimulate granulation over this area. We will run this through her insurance. She is not eligible for plastic surgery for many reasons 3/1; TELEHEALTH. The patient was seen today by telehealth. 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 We have been using Iodoflex to the wound areas which include the right anterior mid tibia, right lateral ankle and the right heel. All of these were reviewed 3/8; the patient's wound just above the right ankle is closed. She still has a contracting black eschar on the heel where she had a pressure ulcer. The medial part of her original wound on the mid tibia has  exposed  muscle. We have made really made no progress in this area although we have managed to get a lot of the original wound in this area to close. We used Apligraf #1 today 3/22; the patient's ankle wound remains closed. She still has a contracting black eschar on the heel although it seems to have less surface area. It still not clear whether there is any depth here. We used Apligraf #2 today in an attempt to get granulation over the exposed muscle and what is left of her mid tibia area. 4/5; everything is closed except the medial aspect of the mid tibia wound, as well as the pressure injury on the right heel. She has been using Betadine to the right heel which has been gradually contracting. We used Apligraf #3 today. 4/19; Apligraf #4. Still a pressure area on the right heel she has been using Betadine superficial excoriated areas she has been applying salicylic acid to on the anterior leg and the thick horn on the leg just above her wound area 5/3; Apligraf #5. Unfortunately she came in today with a reopening of the area on the right lateral lower leg. Not much change in the wound we have been treating in the mid calf. Quite a bit of edema surrounding this wound. She reports that she was up on her feet quite a bit. The area on the right heel is separating she has been using Betadine She comes in with a new wound on the left lateral malleolus which is very disappointing this is been open since last week 5/17; we applied her last Apligraf last time. Unfortunately that did not have any effect on the deep area medially in the mid tibial area. However the area over the right lateral malleolus is a lot better. Right heel also is contracted we used Iodoflex last time. The new wound on the left lateral malleolus were using Santyl to. This required debridement. Objective Constitutional Sitting or standing Blood Pressure is within target range for patient.. Pulse regular and within target range for patient.Marland Kitchen  Respirations regular, non-labored and within target range.. Temperature is normal and within the target range for the patient.Marland Kitchen Appears in no distress. Vitals Time Taken: 12:00 PM, Height: 68 in, Weight: 132 lbs, BMI: 20.1, Temperature: 98.4 F, Pulse: 68 bpm, Respiratory Rate: 19 breaths/min, Blood Pressure: 138/42 mmHg. General Notes: Wound exam ooMidportion of the tibia medially absolutely no change this did not really respond to the Apligraf which is disappointing still exposed muscle ooReopening of the right lateral malleolus is a lot better. ooRight heel has contracted ooLeft lateral malleolus small punched out wound. Necrotic surface. I used a #3 curette to remove some of this I am still not able to get to a viable surface Integumentary (Hair, Skin) Wound #16 status is Open. Original cause of wound was Pressure Injury. The wound is located on the Right Calcaneus. The wound measures 1.2cm length x 1.7cm width x 0.4cm depth; 1.602cm^2 area and 0.641cm^3 volume. There is no tunneling or undermining noted. There is a small amount of serous drainage noted. The wound margin is distinct with the outline attached to the wound base. There is small (1-33%) pink granulation within the wound bed. There is a large (67-100%) amount of necrotic tissue within the wound bed including Eschar and Adherent Slough. Wound #19 status is Open. Original cause of wound was Gradually Appeared. The wound is located on the Right,Lateral Malleolus. The wound measures 2.1cm length x 0.7cm width x 0.2cm depth; 1.155cm^2 area  and 0.231cm^3 volume. There is Fat Layer (Subcutaneous Tissue) Exposed exposed. There is no tunneling or undermining noted. There is a medium amount of serosanguineous drainage noted. The wound margin is distinct with the outline attached to the wound base. There is medium (34-66%) red, pink granulation within the wound bed. There is a medium (34-66%) amount of necrotic tissue within the wound  bed including Adherent Slough. Wound #20 status is Open. Original cause of wound was Gradually Appeared. The wound is located on the Left,Lateral Malleolus. The wound measures 0.7cm length x 0.5cm width x 0.2cm depth; 0.275cm^2 area and 0.055cm^3 volume. There is no tunneling or undermining noted. There is a medium amount of serous drainage noted. The wound margin is well defined and not attached to the wound base. There is no granulation within the wound bed. There is a large (67-100%) amount of necrotic tissue within the wound bed including Adherent Slough. Wound #5 status is Open. Original cause of wound was Gradually Appeared. The wound is located on the Right,Medial Lower Leg. The wound measures 2.7cm length x 2.3cm width x 0.3cm depth; 4.877cm^2 area and 1.463cm^3 volume. There is Fat Layer (Subcutaneous Tissue) Exposed exposed. There is no tunneling or undermining noted. There is a medium amount of serosanguineous drainage noted. There is medium (34-66%) pink granulation within the wound bed. There is a medium (34-66%) amount of necrotic tissue within the wound bed including Adherent Slough. 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 There was a Selective/Open Wound Non-Viable Tissue Debridement with a total area of 0.35 sq cm performed by Ricard Dillon., MD. With the following instrument(s): Curette to remove Viable and Non-Viable  tissue/material. Material removed includes [place]. No specimens were taken. A time out was conducted at 12:38, prior to the start of the procedure. A Minimum amount of bleeding was controlled with Silver Nitrate. The procedure was tolerated well with a pain level of 0 throughout and a pain level of 0 following the procedure. Post Debridement Measurements: 0.7cm length x 0.5cm width x 0.2cm depth; 0.055cm^3 volume. Character of Wound/Ulcer Post Debridement requires further debridement. Severity of Tissue Post Debridement is: Fat layer exposed. Post procedure Diagnosis Wound #20: Same as Pre-Procedure Wound #19 Pre-procedure diagnosis of Wound #19 is a Venous Leg Ulcer located on the Right,Lateral Malleolus . There was a Three Layer Compression Therapy Procedure by Levan Hurst, RN. Post procedure Diagnosis Wound #19: Same as Pre-Procedure Wound #5 Pre-procedure diagnosis of Wound #5 is a Venous Leg Ulcer located on the Right,Medial Lower Leg . There was a Three Layer Compression Therapy Procedure by Levan Hurst, RN. Post procedure Diagnosis Wound #5: Same as Pre-Procedure Plan Follow-up Appointments: Return Appointment in 2 weeks. Nurse Visit: - 1 week for rewrap Dressing Change Frequency: Do not change entire dressing for one week. - right leg/heel Wound #20 Left,Lateral Malleolus: Change dressing every day. Skin Barriers/Peri-Wound Care: Moisturizing lotion - both legs Wound Cleansing: May shower with protection. Wound #20 Left,Lateral Malleolus: May shower and wash wound with soap and water. Primary Wound  Dressing: Wound #16 Right Calcaneus: Iodoflex Wound #19 Right,Lateral Malleolus: Silver Collagen - moisten with hydrogel Wound #20 Left,Lateral Malleolus: Santyl Ointment Wound #5 Right,Medial Lower Leg: Silver Collagen - moisten with hydrogel Secondary Dressing: Wound #16 Right Calcaneus: Dry Gauze Heel Cup Wound #19 Right,Lateral Malleolus: Adaptic Dressing -  secure with steri strips Dry Gauze Wound #20 Left,Lateral Malleolus: Foam Border Wound #5 Right,Medial Lower Leg: Adaptic Dressing - secure with steri strips Dry Gauze Edema Control: 3 Layer Compression System - Right Lower Extremity Avoid standing for long periods of time Elevate legs to the level of the heart or above for 30 minutes daily and/or when sitting, a frequency of: - throughout the day Off-Loading: Turn and reposition every 2 hours Other: - float heels off of bed/chair with pillow under calves 1. Variety of different dressings 2. Collagen to the area on the right mid tibia and right lateral malleolus 3. Iodoflex to the right heel 4. Still Santyl to the left medial malleolus 5. Both legs under compression. We will have her back next week for a nurse visit to change these Electronic Signature(s) Signed: 05/16/2020 4:51:55 PM By: Linton Ham MD Signed: 05/16/2020 5:54:52 PM By: Levan Hurst RN, BSN Entered By: Levan Hurst on 05/16/2020 16:50:35 -------------------------------------------------------------------------------- SuperBill Details Patient Name: Date of Service: [name], [name] 05/16/2020 Medical Record Number: [id number] Patient Account Number: [id number] Date of Birth/Sex: Treating RN: 1951/05/14 (69 y.o. 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 I87.331 Chronic venous hypertension (idiopathic) with ulcer and inflammation of right lower extremity L89.616 Pressure-induced deep tissue damage of right heel L97.328  Non-pressure chronic ulcer of left ankle with other specified severity Facility Procedures CPT4 Code: [id number] Description: 59163 - DEBRIDE WOUND 1ST 20 SQ CM OR < ICD-10 Diagnosis Description L97.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 Modifier: Quantity: 1 CPT4 Code: [id number] Description: (Facility Use Only) 57017BL - APPLY MULTLAY COMPRS LWR RT LEG Modifier: 59 Quantity: 1 Physician Procedures : CPT4 Code Description Modifier [id number] - WC PHYS DEBR WO ANESTH 20 SQ CM ICD-10 Diagnosis Description L97.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 Quantity: 1 Electronic Signature(s) Signed: 05/16/2020 4:51:55 PM By: Linton Ham MD Signed: 05/16/2020 5:54:52 PM By: Levan Hurst RN, BSN Entered By: Levan Hurst on 05/16/2020 16:51:01

## 2020-05-23 ENCOUNTER — Encounter (HOSPITAL_BASED_OUTPATIENT_CLINIC_OR_DEPARTMENT_OTHER): Payer: Medicare Other | Admitting: Internal Medicine

## 2020-05-23 DIAGNOSIS — L89616 Pressure-induced deep tissue damage of right heel: Secondary | ICD-10-CM | POA: Diagnosis not present

## 2020-05-23 DIAGNOSIS — L97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: Secondary | ICD-10-CM | POA: Diagnosis not present

## 2020-05-23 DIAGNOSIS — L97811 Non-pressure chronic ulcer of other part of right lower leg limited to breakdown of skin: Secondary | ICD-10-CM | POA: Diagnosis not present

## 2020-05-23 DIAGNOSIS — D0471 Carcinoma in situ of skin of right lower limb, including hip: Secondary | ICD-10-CM | POA: Diagnosis not present

## 2020-05-23 DIAGNOSIS — M349 Systemic sclerosis, unspecified: Secondary | ICD-10-CM | POA: Diagnosis not present

## 2020-05-23 DIAGNOSIS — I10 Essential (primary) hypertension: Secondary | ICD-10-CM | POA: Diagnosis not present

## 2020-05-23 DIAGNOSIS — I739 Peripheral vascular disease, unspecified: Secondary | ICD-10-CM | POA: Diagnosis not present

## 2020-05-23 DIAGNOSIS — L97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: Secondary | ICD-10-CM | POA: Diagnosis not present

## 2020-05-23 NOTE — Progress Notes (Signed)
[name] [name], [name] [name] ([id number]) Visit Report for 05/23/2020 Arrival Information Details Patient Name: Date of Service: [name] 05/23/2020 11:15 A M Medical Record Number: [id number] Patient Account Number: [id number] Date of Birth/Sex: Treating RN: [birth year] (69 y.o. [name] [name] Primary Care [name] [name]: [name] [name], [place] RY Other Clinician: Referring [name] [name]: Treating [name] [name]/Extender: [name] Hoover, MA RY Weeks in Treatment: 426 Visit Information History Since Last Visit Added or deleted any medications: No Patient Arrived: Wheel Chair Any new allergies or adverse reactions: No Arrival Time: 11:39 Had a fall or experienced change in No Accompanied By: husband activities of daily living that may affect Transfer Assistance: None risk of falls: Patient Identification Verified: Yes Hospitalized since last visit: No Secondary Verification Process Completed: Yes Implantable device outside of the clinic excluding No Patient Requires Transmission-Based Precautions: No cellular tissue based products placed in the center Patient Has Alerts: No since last visit: Has Dressing in Place as Prescribed: Yes Has Compression in Place as Prescribed: Yes Pain Present Now: No Electronic Signature(s) Signed: 05/23/2020 4:34:07 PM By: [name] [name] Entered By: [name] [name] on 05/23/2020 11:40:02 -------------------------------------------------------------------------------- Compression Therapy Details Patient Name: Date of Service: BLA [name] [name] F. 05/23/2020 11:15 A M Medical Record Number: [id number] Patient Account Number: [id number] Date of Birth/Sex: Treating RN: [birth year] (69 y.o. [name] [name] Primary Care [name] [name]: [name] [name], [place] RY Other Clinician: Referring Dekayla Prestridge: Treating Dannia Snook/Extender: [name] Hoover, MA RY Weeks in Treatment: 426 Compression Therapy Performed for Wound Assessment: Wound #19 Right,Lateral Malleolus Performed  By: Clinician [name] Millin, RN Compression Type: Three Layer Electronic Signature(s) Signed: 05/23/2020 4:34:07 PM By: [name] [name] Entered By: [name] [name] on 05/23/2020 11:48:39 -------------------------------------------------------------------------------- Compression Therapy Details Patient Name: Date of Service: [name] 05/23/2020 11:15 A M Medical Record Number: [id number] Patient Account Number: [id number] Date of Birth/Sex: Treating RN: September 12, 1951 (69 y.o. [name] [name] Primary Care [name] [name]: Other Clinician: [name], MA RY Referring Allea Kassner: Treating Allexus Ovens/Extender: [name] Hoover, MA RY Weeks in Treatment: 426 Compression Therapy Performed for Wound Assessment: Wound #16 Right Calcaneus Performed By: Clinician [name] Millin, RN Compression Type: Three Layer Electronic Signature(s) Signed: 05/23/2020 4:34:07 PM By: [name] [name] Entered By: [name] [name] on 05/23/2020 11:48:40 -------------------------------------------------------------------------------- Compression Therapy Details Patient Name: Date of Service: [name] 05/23/2020 11:15 A M Medical Record Number: [id number] Patient Account Number: [id number] Date of Birth/Sex: Treating RN: 1951-10-31 (69 y.o. [name] [name] Primary Care [name] [name]: [name] [name], [place] RY Other Clinician: Referring Keshaun Dubey: Treating Noma Quijas/Extender: [name] Hoover, MA RY Weeks in Treatment: 426 Compression Therapy Performed for Wound Assessment: Wound #5 Right,Medial Lower Leg Performed By: Clinician [name] Millin, RN Compression Type: Three Layer Electronic Signature(s) Signed: 05/23/2020 4:34:07 PM By: [name] [name] Entered By: [name] [name] on 05/23/2020 11:48:40 -------------------------------------------------------------------------------- Encounter Discharge Information Details Patient Name: Date of Service: [name], [name]  05/23/2020 11:15 A M Medical Record Number: [id number] Patient Account Number: [id number] Date of Birth/Sex: Treating RN: 1951/05/14 (69 y.o. [name] [name] Primary Care [name] [name]: [name] [name], [place] RY Other Clinician: Referring [name] [name]: Treating [name] [name]/Extender: [name] Hoover, MA RY Weeks in Treatment: 81 Encounter Discharge Information Items Discharge Condition: Stable Ambulatory Status: Wheelchair Discharge Destination: Home Transportation: Private Auto Accompanied By: husband Schedule Follow-up Appointment: Yes Clinical Summary of Care: Patient Declined Electronic Signature(s) Signed: 05/23/2020 4:34:07 PM By: [name] [name] Entered By: [name] [name] on 05/23/2020 11:53:52 --------------------------------------------------------------------------------  Patient/Caregiver Education Details Patient Name: Date of Service: [name] 5/24/2021andnbsp11:15 A M Medical Record Number: [id number] Patient Account Number: [id number] Date of Birth/Gender: Treating RN: 1951-06-16 (69 y.o. [name] [name] Primary Care Physician: [name] [name], [place] RY Other Clinician: Referring Physician: Treating Physician/Extender: [name] Hoover, MA RY Weeks in Treatment: 73 Education Assessment Education Provided To: Patient Education Topics Provided Wound/Skin Impairment: Handouts: Caring for Your Ulcer Methods: Explain/Verbal Responses: State content correctly Electronic Signature(s) Signed: 05/23/2020 4:34:07 PM By: [name] [name] Entered By: [name] [name] on 05/23/2020 11:53:08 -------------------------------------------------------------------------------- Wound Assessment Details Patient Name: Date of Service: BLA [name] [name] F. 05/23/2020 11:15 A M Medical Record Number: [id number] Patient Account Number: [id number] Date of Birth/Sex: Treating RN: 28-May-1951 (69 y.o. [name] [name] Primary Care Jenay Morici: [name] [name], [place] RY Other  Clinician: Referring Cortlynn Hollinsworth: Treating Iyla Balzarini/Extender: [name] Hoover, MA RY Weeks in Treatment: 426 Wound Status Wound Number: 16 Primary Pressure Ulcer Etiology: Wound Location: Right Calcaneus Wound Open Wounding Event: Pressure Injury Status: Date Acquired: 01/04/2020 Comorbid Anemia, Hypertension, Peripheral Arterial Disease, Peripheral Weeks Of Treatment: 20 History: Venous Disease, Raynauds, Scleroderma, Rheumatoid Arthritis, Clustered Wound: No Osteoarthritis Wound Measurements Length: (cm) 1.2 Width: (cm) 1.7 Depth: (cm) 0.4 Area: (cm) 1.602 Volume: (cm) 0.641 % Reduction in Area: 87.3% % Reduction in Volume: 49% Epithelialization: None Tunneling: No Undermining: No Wound Description Classification: Unstageable/Unclassified Wound Margin: Distinct, outline attached Exudate Amount: Small Exudate Type: Serous Exudate Color: amber Foul Odor After Cleansing: No Slough/Fibrino Yes Wound Bed Granulation Amount: Small (1-33%) Exposed Structure Granulation Quality: Pink Fascia Exposed: No Necrotic Amount: Large (67-100%) Fat Layer (Subcutaneous Tissue) Exposed: No Necrotic Quality: Eschar, Adherent Slough Tendon Exposed: No Muscle Exposed: No Joint Exposed: No Bone Exposed: No Treatment Notes Wound #16 (Right Calcaneus) 1. Cleanse With Wound Cleanser Soap and water 2. Periwound Care Moisturizing lotion 3. Primary Dressing Applied Iodoflex 4. Secondary Dressing Dry Gauze 6. Support Layer Applied 3 layer compression wrap Notes patient declined heel cup. Electronic Signature(s) Signed: 05/23/2020 4:34:07 PM By: [name] [name] Entered By: [name] [name] on 05/23/2020 11:42:36 -------------------------------------------------------------------------------- Wound Assessment Details Patient Name: Date of Service: [name] 05/23/2020 11:15 A M Medical Record Number: [id number] Patient Account Number: [id number] Date of  Birth/Sex: Treating RN: 1951/05/16 (69 y.o. [name] [name] Primary Care Daina Cara: [name] [name], [place] RY Other Clinician: Referring Mikyla Schachter: Treating Kynnedi Zweig/Extender: [name] Hoover, MA RY Weeks in Treatment: 426 Wound Status Wound Number: 19 Primary Venous Leg Ulcer Etiology: Wound Location: Right, Lateral Malleolus Wound Open Wounding Event: Gradually Appeared Status: Date Acquired: 05/02/2020 Comorbid Anemia, Hypertension, Peripheral Arterial Disease, Peripheral Weeks Of Treatment: 3 History: Venous Disease, Raynauds, Scleroderma, Rheumatoid Arthritis, Clustered Wound: No Osteoarthritis Wound Measurements Length: (cm) 2.1 Width: (cm) 0.7 Depth: (cm) 0.2 Area: (cm) 1.155 Volume: (cm) 0.231 % Reduction in Area: -719.1% % Reduction in Volume: -725% Epithelialization: None Tunneling: No Undermining: No Wound Description Classification: Full Thickness Without Exposed Support Structures Wound Margin: Distinct, outline attached Exudate Amount: Medium Exudate Type: Serosanguineous Exudate Color: red, brown Foul Odor After Cleansing: No Slough/Fibrino Yes Wound Bed Granulation Amount: None Present (0%) Exposed Structure Necrotic Amount: Large (67-100%) Fascia Exposed: No Necrotic Quality: Adherent Slough Fat Layer (Subcutaneous Tissue) Exposed: Yes Tendon Exposed: No Muscle Exposed: No Joint Exposed: No Bone Exposed: No Treatment Notes Wound #19 (Right, Lateral Malleolus) 1. Cleanse With Wound Cleanser Soap and water 2. Periwound Care Moisturizing lotion 3. Primary Dressing Applied Collegen  AG 4. Secondary Dressing Dry Gauze 6. Support Layer Applied 3 layer compression wrap Electronic Signature(s) Signed: 05/23/2020 4:34:07 PM By: [name] [name] Entered By: [name] [name] on 05/23/2020 11:47:20 -------------------------------------------------------------------------------- Wound Assessment Details Patient Name: Date of Service: [name] 05/23/2020 11:15 A M Medical Record Number: [id number] Patient Account Number: [id number] Date of Birth/Sex: Treating RN: October 09, 1951 (69 y.o. [name] [name] Primary Care Margareta Laureano: [name] [name], [place] RY Other Clinician: Referring Kimyatta Lecy: Treating Hiilei Gerst/Extender: [name] Hoover, MA RY Weeks in Treatment: 426 Wound Status Wound Number: 20 Primary Arterial Insufficiency Ulcer Etiology: Wound Location: Left, Lateral Malleolus Wound Open Wounding Event: Gradually Appeared Status: Date Acquired: 05/02/2020 Comorbid Anemia, Hypertension, Peripheral Arterial Disease, Peripheral Weeks Of Treatment: 3 History: Venous Disease, Raynauds, Scleroderma, Rheumatoid Arthritis, Clustered Wound: No Osteoarthritis Wound Measurements Length: (cm) 0.7 Width: (cm) 0.5 Depth: (cm) 0.2 Area: (cm) 0.275 Volume: (cm) 0.055 % Reduction in Area: 37.5% % Reduction in Volume: 37.5% Epithelialization: None Tunneling: No Undermining: No Wound Description Classification: Unclassifiable Wound Margin: Well defined, not attached Exudate Amount: Medium Exudate Type: Serous Exudate Color: amber Foul Odor After Cleansing: No Slough/Fibrino Yes Wound Bed Granulation Amount: None Present (0%) Exposed Structure Necrotic Amount: Large (67-100%) Fascia Exposed: No Necrotic Quality: Adherent Slough Fat Layer (Subcutaneous Tissue) Exposed: No Tendon Exposed: No Muscle Exposed: No Joint Exposed: No Bone Exposed: No Electronic Signature(s) Signed: 05/23/2020 4:34:07 PM By: [name] [name] Entered By: [name] [name] on 05/23/2020 11:47:45 -------------------------------------------------------------------------------- Wound Assessment Details Patient Name: Date of Service: BLA [name] [name] F. 05/23/2020 11:15 A M Medical Record Number: [id number] Patient Account Number: [id number] Date of Birth/Sex: Treating RN: [birth year] (69 y.o. [name] [name] Primary Care Amay Mijangos: [name] [name], [place] RY Other Clinician: Referring Taejah Ohalloran: Treating Aliayah Tyer/Extender: [name] Hoover, MA RY Weeks in Treatment: 426 Wound Status Wound Number: 5 Primary Etiology: Venous Leg Ulcer Wound Location: Right, Medial Lower Leg Wound Status: Open Wounding Event: Gradually Appeared Date Acquired: 01/13/2013 Weeks Of Treatment: 382 Clustered Wound: Yes Wound Measurements Length: (cm) 2.7 Width: (cm) 2.3 Depth: (cm) 0.3 Area: (cm) 4.877 Volume: (cm) 1.463 % Reduction in Area: -244.9% % Reduction in Volume: -937.6% Wound Description Classification: Full Thickness With Exposed Support Structures Treatment Notes Wound #5 (Right, Medial Lower Leg) 1. Cleanse With Wound Cleanser Soap and water 2. Periwound Care Moisturizing lotion 3. Primary Dressing Applied Collegen AG 4. Secondary Dressing Dry Gauze 6. Support Layer Applied 3 layer compression wrap Electronic Signature(s) Signed: 05/23/2020 4:34:07 PM By: [name] [name] Entered By: [name] [name] on 05/23/2020 11:41:50 -------------------------------------------------------------------------------- Vitals Details Patient Name: Date of Service: [name], [name] 05/23/2020 11:15 A M Medical Record Number: [id number] Patient Account Number: [id number] Date of Birth/Sex: Treating RN: 08-30-1951 (69 y.o. [name] [name] Primary Care Liliann File: [name] [name], [place] RY Other Clinician: Referring Suan Pyeatt: Treating Abbiegail Landgren/Extender: [name] Hoover, MA RY Weeks in Treatment: 426 Vital Signs Time Taken: 11:40 Temperature (F): 97.8 Height (in): 68 Pulse (bpm): 73 Weight (lbs): 132 Respiratory Rate (breaths/min): 19 Body Mass Index (BMI): 20.1 Blood Pressure (mmHg): 103/91 Reference Range: 80 - 120 mg / dl Electronic Signature(s) Signed: 05/23/2020 4:34:07 PM By: [name] [name] Entered By: [name] [name] on 05/23/2020 11:40:21

## 2020-05-25 DIAGNOSIS — D638 Anemia in other chronic diseases classified elsewhere: Secondary | ICD-10-CM | POA: Diagnosis not present

## 2020-05-25 DIAGNOSIS — G629 Polyneuropathy, unspecified: Secondary | ICD-10-CM | POA: Diagnosis not present

## 2020-05-25 DIAGNOSIS — M340 Progressive systemic sclerosis: Secondary | ICD-10-CM | POA: Diagnosis not present

## 2020-05-25 DIAGNOSIS — Z681 Body mass index (BMI) 19 or less, adult: Secondary | ICD-10-CM | POA: Diagnosis not present

## 2020-05-25 DIAGNOSIS — M79672 Pain in left foot: Secondary | ICD-10-CM | POA: Diagnosis not present

## 2020-05-25 DIAGNOSIS — I7301 Raynaud's syndrome with gangrene: Secondary | ICD-10-CM | POA: Diagnosis not present

## 2020-05-25 DIAGNOSIS — I272 Pulmonary hypertension, unspecified: Secondary | ICD-10-CM | POA: Diagnosis not present

## 2020-05-25 DIAGNOSIS — K219 Gastro-esophageal reflux disease without esophagitis: Secondary | ICD-10-CM | POA: Diagnosis not present

## 2020-05-25 DIAGNOSIS — M810 Age-related osteoporosis without current pathological fracture: Secondary | ICD-10-CM | POA: Diagnosis not present

## 2020-05-25 DIAGNOSIS — S81801D Unspecified open wound, right lower leg, subsequent encounter: Secondary | ICD-10-CM | POA: Diagnosis not present

## 2020-05-26 NOTE — Progress Notes (Signed)
[name] [name], [name] [name] ([id number]) Visit Report for 05/23/2020 SuperBill Details Patient Name: Date of Service: [name] [name] 05/23/2020 Medical Record Number: [id number] Patient Account Number: [id number] Date of Birth/Sex: Treating RN: 08-09-1951 (69 y.o. 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

## 2020-05-31 ENCOUNTER — Encounter (HOSPITAL_BASED_OUTPATIENT_CLINIC_OR_DEPARTMENT_OTHER): Payer: Medicare Other | Attending: Internal Medicine | Admitting: Internal Medicine

## 2020-05-31 DIAGNOSIS — L89616 Pressure-induced deep tissue damage of right heel: Secondary | ICD-10-CM | POA: Diagnosis not present

## 2020-05-31 DIAGNOSIS — L97811 Non-pressure chronic ulcer of other part of right lower leg limited to breakdown of skin: Secondary | ICD-10-CM | POA: Diagnosis not present

## 2020-05-31 DIAGNOSIS — I87313 Chronic venous hypertension (idiopathic) with ulcer of bilateral lower extremity: Secondary | ICD-10-CM | POA: Diagnosis not present

## 2020-05-31 DIAGNOSIS — L97312 Non-pressure chronic ulcer of right ankle with fat layer exposed: Secondary | ICD-10-CM | POA: Diagnosis not present

## 2020-05-31 DIAGNOSIS — I87331 Chronic venous hypertension (idiopathic) with ulcer and inflammation of right lower extremity: Secondary | ICD-10-CM | POA: Diagnosis not present

## 2020-05-31 DIAGNOSIS — L97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: Secondary | ICD-10-CM | POA: Insufficient documentation

## 2020-05-31 DIAGNOSIS — L97828 Non-pressure chronic ulcer of other part of left lower leg with other specified severity: Secondary | ICD-10-CM | POA: Diagnosis not present

## 2020-05-31 DIAGNOSIS — L97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: Secondary | ICD-10-CM | POA: Diagnosis not present

## 2020-06-01 ENCOUNTER — Other Ambulatory Visit: Payer: Self-pay

## 2020-06-01 MED ORDER — FOLIC ACID 1 MG PO TABS: 1.0000 mg | ORAL_TABLET | Freq: Every day | ORAL | 1 refills | Status: DC

## 2020-06-01 NOTE — Progress Notes (Signed)
[name] [name], [name] [name] ([id number]) Visit Report for 05/31/2020 HPI Details Patient Name: Date of Service: [name] 05/31/2020 11:00 A M Medical Record Number: [id number] Patient Account Number: [id number] Date of Birth/Sex: Treating RN: 09-Feb-1951 (69 y.o. [name] [name] Primary Care Provider: Alton [name], [place] RY Other Clinician: Referring Provider: Treating Provider/Extender: [name] Drafts, MA RY Weeks in Treatment: 417 History of Present Illness HPI Description: this is a patient who initially came to [place] for wounds on the medial malleoli bilaterally as well as her upper medial lower extremities bilaterally. These wounds eventually healed with assistance of Apligraf's bilaterally. While this was occurring she developed the current wound which opened into a fairly substantial wound on the right lower extremity. These are mostly secondary to venous stasis physiology however the patient also has underlying scleroderma, pulmonary hypertension. The wound has been making good progress lately with the Hydrofera Blue-based dressing. 03/17/2015; patient had a arterial evaluation a year ago. Her right ABI was 0.86 left was 1.0. T brachial index was 0.41 on the right 0.45 on the left. Her oe bilateral common femoral artery waveforms were triphasic. Her white popliteal posterior tibial artery and anterior tibial artery waveforms were monophasic with good amplitude. Luteal artery waveforms were biphasic it was felt that her bilateral great toe pressures are of normal although adequate for tissue healing. 03/24/2015. The condition of this wound is not really improved that. He was covered with as fibrinous surface slough and eschar. This underwent an aggressive debridement with both a curette and scalpel. I still cannot really get down to what I can would consider to be a viable surface. There is no evidence of infection. Previous workup for ischemia roughly a year ago was negative nevertheless I think  that continues to be a concern 04/07/15. The patient arrived for application of second Apligraf. Once again the surface of this wound is certainly less viable than I would like for an advanced treatment option. An aggressive debridement was done. She developed some arteriolar bleeding which required that along pressure and silver alginate. 04/21/15: change in the condition of this wound. Once again it is covered in a gelatinous surface slough. After debridement today surface of the wound looks somewhat better but now a heeling surface 05/05/15 Apligraf #4 applied.Still a lot of slough on this wound. 05/19/15 Apligraf #5 applied. Still a lot of slough on this wound I did not aggressively remove this 06/02/15; continued copious amounts of surface slough. This debridement fairly easily. 06/08/2015 -- the last time she had a venous duplex study done was over 3 years ago and the surgery was prior to that. I have recommended that she sees Dr. early for a another opinion regarding a repeat venous duplex and possibly more endovenous ablation of vein stripping of micro-phlebotomies. 06/16/15; wound has a gelatinous surface eschar that the debridement fairly easily to a point. I don't disagree with the venous workup and perhaps even arterial re-evaluation. She is on prednisone 5 mg and continue his medications for her pulmonary artery hypertension I am not sure if the latter have any wound care healing issues I would need to investigate this. 06/23/15 continues with a gelatinous surface eschar with of fibrinous underlying. What I can see of this wound does not look unhealthy however I just can't get this material which I think is 2 different layers off. Empiric culture done 06/30/15; unfortunately not a lot of change in this wound. A gelatinous surface eschar is easily removed however  it has a tight fibrinous surface underneath the. 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 The wound comes back and  basically and unchanged state the. She has a gelatinous surface that is more easily removed however there is a tightly fibrinous surface underneath the. There is no evidence of infection. She has a vascular follow-up next month. I would have to inject her in order to do a more aggressive debridement of this area 07/21/15 the wound is roughly in the same state albeit the debridement is done with greater ease. There is less of the fibrinous eschar underneath the. There is no evidence of infection. She has follow-up with vascular surgery next week. No evidence of surrounding infection. Her original distal wounds healed while this one formed. 07/28/15; wound is easier to debride. No wound erythema. She is seeing Dr. [name] next week. 08/18/15 Has been to [place] for repeat ablation. Have been using medihoney pad with some improvement 08/25/15; absolutely no change in the condition of this wound in either its overall size or surface condition in many months now. At one point I had this down to [place] healthier surface I think with [facility] however this did not actually progress towards closure. Do not believe that the wound has actually deteriorated in terms of volume at all. We have been using a medihoney pad which allows easier debridement of the gelatinous eschar but again no overall actual improvement. the patient is going towards an ablation with pain and vascular which I think is scheduled for next week. The only other investigation that I could first see would be a biopsy. She does have underlying scleroderma 09/02/15 eschar is much easier to debridement however the base of this does not look particularly vibrant. We changed to Iodoflex. The patient had her ablation earlier this week 09/09/15 again the debridement over the base of this wound is easier and the base of the wound looks considerably better. We will continue the Iodoflex. Dr. Tawni Millers has expressed his satisfaction with the result of her  ablation 09/15/15 once again the wound is relatively free of surface eschar. No debridement was done today. It has a pale-looking base to it. although this is not as deep as it once was it seems to be expanding especially inferiorly. She has had recent venous ablation but this is no closer to healing.I've gone ahead and done a punch biopsy this from the inferior part of the wound close to normal skin 09/22/15: the wound is relatively free of surface eschar. There is some surrounding eschar. I'm not exactly sure at what level the surface is that I am seeing. 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 No evidence of atypical infection, malignancy. 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 No debridement was done 09/29/15; the wound surface is relatively free of surface eschar. There is eschar surrounding the walls of the wound. Aside from the improvement in the amount of surface slough. This wound has not progressed any towards closure. There is not even a surface that looks like there at this is ready. There is no evidence of any infection nor maligancy based on biopsy I did on 9/15. I continued with the Iodoflex however I am looking towards some alternative to try to promote some closure or filling in of this surface. Consider triple layer [place]. Collagen did not result in adequate control of the surface slough 10/13/15; the patient was in hospital last week with severe anemia.  The wound looks somewhat better after debridement although there is widening medially. There is no evidence of infection. 10/20/15; patient's wound on the right lateral lower leg is essentially unchanged. This underwent a light surface debridement and in general the debridement is easier and the surface looks improved. I noted in doing this on the side of the wound what appears to be a piece of calcium deposition. The patient noted that she had previous things on the tips of  her fingers. 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 She follows with rheumatology and I have asked them to talk to her about this. In view of that S the nonhealing ulcer may have something to do with calcinosis and also unrelieved Raynaud's disease in this area. I should note that her original wounds on the right and left medial malleolus and the inner aspects of both legs just below the knees did however heal over 10/30/15; the patient's wound on the right lateral lower leg is essentially unchanged. I was able to remove some calcified material from the medial wound edge. 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 Otherwise the wound appears essentially unchanged there is less adherent surface eschar. Some of the calcified material was sent to pathology for analysis 11/17/15. The patient's wound on the right lateral leg is essentially unchanged. Wider Medially. 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 There is some surface slough but I did not debridement this. No evidence of surrounding infection. 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 This will make healing difficult 12/15/15; the wound has less depth surface has a fibrinous slough and calcifications in the wound edge no evidence of infection 12/22/15; the wound definitely has less Fibrinous slough on the base. Calcifications around the wound edges are still evident. Although the wound bed looks healthier it is still pale in appearance. Previous biopsy did not show malignancy 01/04/15; surgical debridement of nonviable slough and  subcutaneous tissue the wound cleans up quite nicely but appears to be expanding outward calcifications around the wound edges are still there. 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 She is to see her rheumatologist I'll see if he has any opinions on this. My punch biopsy done in September did not show calcifications although these are clearly evident. 01/19/16 light selective debridement of nonviable surface slough. There is epithelialization medially. This gives me reason for cautious optimism. She has been to see her rheumatologist, there is nothing that can be done for this type of soft tissue calcification associated with scleroderma 02/02/16 no debridement although there is a light surface slough. She has 2 peninsulas of skin 1 inferiorly and one medially. We continue to make a slight and slow but definite progressive here 02/16/16; light surface debridement with more attention to the circumference of the wound bed where the fibrinous eschar is more prevalent. No calcifications detected. She seems to have done nicely with the [facility] with some epithelialization and some improvement in the overall wound volume. 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  Continued attention to the circumference of the wound where the fibrinous eschar in calcinosis or prevalent. No calcifications were detected. She has continued improvement with Hydrofera Blue. The wound is no longer as deep 03/15/16 surgical debridement done to remove surface escha Especially around the circumference of the wound where there is nonviable subcutaneous tissue. In spite of this there is considerable improvement in the overall dimensions and depth of the wound. Islands of epithelialization are seen especially medially inferiorly and superiorly to a lesser extent.  She is using Hydrofera Blue at home 03/29/16; surgical debridement done to remove surface eschar and nonviable subcutaneous tissue. This cleans up quite nicely mention slightly larger in terms of length and width however depth is less 04/12/16; continued gradual improvement in terms of depth and the condition of the wound base. Debridement is done. Continuing long standing Hydrofera Blue at home with Kerlix Coban wraps 04/26/16; continued gradual improvement in terms of depth and management as well as condition of the wound base. Surgical debridement done she continues with Hydrofera Blue. 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 She initially came to this clinic venous insufficiency ulcers which have long since healed 05/17/16 continued improvement in terms of the depth and measurements of this wound although she has a tightly adherent fibrinous slough each time. We've been continued with long standing Hydrofera Blue which seems to done as well for this wound is many advanced treatment options. 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 She also has chronic venous insufficiency. She has an irritation on her lateral right ankle secondary to our wraps 05/10/16; wound appears to be smaller especially on the medial aspect and especially in the width. Wound was debridement surface looks better. She is also been in the hospital apparently with anemia again she tells me she had an endoscopy. Since she got home after 3 days which I believe was sometime last week she has had an irritated painful area on the right lateral ankle surrounding the lateral malleolus 05/31/16; much more adherent surface slough today then recently although I don't think the dimensions of changed that much. A more aggressive debridement is required. The irritated area over her medial malleolus is more pruritic and painful and I don't think represents  cellulitis 06/14/16; no major change in her wound dimensions however there is more tightly adherent surface slough which is disappointing. As well as she appears to have a new small area medially. Furthermore an irritated uncomfortable area on the lateral aspect of her right foot just below the lateral malleolus. 06/21/16; I'm seeing the patient back in follow-up for the new areas under her major wound on the right anterior leg. She has been using Hydrofera Blue to this area probably for several months now and although the dressings seem to be helping for quite a period of time I think things have stagnated lately. She comes in today with a relatively tight adherent surface slough and really no changes in the wound shape or dimension. The 2 small areas she had inferiorly are tiny but still open they seem improved this well. There is no uncontrolled edema and I don't think there is any evidence of cellulitis. 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 Patient has had vascular evaluations both venous and arterial. I have biopsied this area. There is no obvious infection. The worrisome  thing today is that she seems to be developing areas of erythema and epithelial damage on the medial aspect of the right foot. Also to a lesser degree inferior to the actual wound itself. Again I see no obvious changes to suggest cellulitis however as this is the only treatable option I will probably give her antibiotics. 07/13/16 no major change in the lady's large anterior right leg wound. Still covered with a very tightly adherent surface slough which is difficult to debridement. There is less erythema around this, culture last week grew pseudomonas I gave her ciprofloxacin. The area on her lateral right malleolus looks better- 08/02/16 the patient's wounds continued to decline. Her original large anterior right leg  wound looks deeper. Still adherent surface slough that is difficult to debridement. She has a small area just below this and a punched-out wound just below her lateral malleolus. In the meantime she is been in hospital with apparently an upper GI bleed on Plavix and aspirin. She is now just on Plavix she received 3 units of packed cells 08/23/16; since I last saw this 3 weeks ago, the open large area on her right leg looks about the same syrup. She has a small satellite lesion just underneath this. The area on her medial right ankle is now a deep necrotic wound. I attempted to debridement this however there is just too much pain. 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 She follows with vascular surgery and is scheduled for an angiogram in early September 09/06/16; the patient is going for an arteriogram tomorrow. Her original large wound on the right calf is about the same the satellite lesion underneath it is about the same however the area on her medial ankle is now deeper with exposed tendon. I am no longer attempting to debride these wounds 09/20/16; the patient has undergone a right femoral endarterectomy and Dacron patch angioplasty. This seems to have helped the flow in her right leg. 10/04/16; Arrived today for aggressive debridement of the wounds on her right calf the original wound the one beneath it and a difficult area over her right lateral leg just above the lateral malleolus. 10/25/16; her 3 open wounds are about the same in terms of dimensions however the surface appears a lot healthier post debridement. Using Iodoflex 10/18/16 we have been using Iodoflex to her wounds which she tolerated with some difficulty. 10/11/16; has been using Santyl for a period of time with some improvement although again very adherent surface slough would prevent any attempted healing this. She has a original wound on the left calf, the  satellite underneath that and the most recent wound on the right medial ankle. She has completed revascularization by Dr. early and has had venous ablation earlier. Want to go back to Iodoflex to see if week and get a healthier surface to this wound bed failing this I think she'll need to be taken to the OR and I am prepared to call Dr. Marla Roe to discuss this. She is obviously not a good candidate for general anesthesia however.; 11/08/16; I put her on Iodoflex last time to see if I can get the wound bed any healthier and unfortunately today still had tremendous surface slough. 11/15/16; 4 weeks' worth of Iodoflex with not much improvement. Debridement on the major wounds on her left anterior leg is easier however this does not maintain from week to week. The punched-out area on her medial right  ankle 11/29/16; I attempted to change the patient last visit to [facility] however she states this burned and was very uncomfortable therefore we gave her permission to go back to the eye out of complex which she already had at home. Also she noted a lot of pain and swelling on the lateral aspect of her leg before she traveled to [facility] for the holidays, I called her in doxycycline over the phone. This seems to have helped 12/06/16; Wounds unchanged by in large. Using Iodoflex 12/13/16; her wounds today actually looks somewhat better. The area on the right lateral lower leg has reasonably healthy-looking granulation and perhaps as actually filled and a bit. Debridement of the 2 wounds on the medial calf is easier and post debridement appears to have a healthier base. We have referred her to Dr. Migdalia Dk for consideration of operative debridement 12/20/16; we have a quick note from Dr. Merri Ray who feels that the patient needs to be referred to an academic center/plastic surgeon. This is due to the complexity of the patient's medical issues as it applies to anything in the OR. We  have been using Iodoflex 12/27/16; in general the wounds on the right leg are better in terms of the difficult to remove surface slough. She has been using Iodoflex. She is approved for Apligraf which I anticipated ordering in the next week or 2 when we get a better-looking surface 01/04/16 the deep wounds on the right leg generally look better. Both of them are debrided further surface slough. The area on the lateral right leg was not debrided. She is approved for Apligraf I think I'll probably order this either next week or the week after depending on the surface of the wounds superiorly. We have been using Iodoflex which will continue until then 01/11/16; the deep wounds on the right leg again have a surface slough that requires debridement. I've not been able to get the wound bed on either one of these wounds down to what would be acceptable for an advanced skin stab-like Apligraf. The area on the medial leg has been improving. We have been using hot Iodoflex to all wounds which seems to do the best at at least limiting the nonviable surface 01/24/17; we have continued Iodoflex and all her wound areas. Her debridement Gen. he is easier and the surface underneath this looks viable. Nevertheless these are large area wounds with exposed muscle at least on the anterior parts. We have ordered Apligraf's for 2 weeks from now. The patient will be away next week 02/07/17; the patient was close to have first Apligraf today however we did not order one. I therefore replaced her Iodoflex. She essentially has 3 large punched- out areas on her right anterior leg and right medial ankle. 02/11/17; Apligraf #1 02/25/17; Apligraf #2. In general some improvement in the right medial ankle and right anterior leg wounds. The larger wound medially perhaps some better 03/11/17; Apligraf #3. In general continued improvement in the right medial ankle and the right anterior leg wounds. 03/25/17 Apligraf #4. In general continued  improvement especially on the right medial ankle and the lower 04/08/17; Apligraf #5 in general continued improvement in all wound sites. 04/22/17; post Apligraf #5 her wound beds continued to look a lot better all of them up to the surface of the surrounding skin. Had a caramel-colored slough that I did not debrided in case there is residual Apligraf effect. The wounds are as good as I have seen these looking quite some  time. 04/29/17; we applied [place] and last week after completing her Apligraf. Wounds look as though they've contracted somewhat although they have a nonviable surface which was problematic in the past. Apparently she has been approved for further Apligraf's. We applied Iodosorb today after debridement. 05/06/17; we're fortunate enough today to be able to apply additional Apligraf approved by her insurance. In general all of her wounds look better Apligraf #6 05/24/17; Apligraf #7 continued improvement in all wounds 3 06/10/17 Apligraf #8. Continued improvement in the surface of all wounds. Not much of an improvement in dimensions as I might a follow 06/24/17 Apligraf #9 continued general improvement although not as much change in the wound areas I might of like. She has a new open area on the right anterior lateral ankle very small and superficial. She also has a necrotic wound on the tip of her right index finger probably secondary to severe uncontrolled Raynaud's phenomenon. She is already on sildenafil and already seen her rheumatologist who gave her Keflex. 07/08/17; Apligraf #10. Generalized improvement although she has a small additional wound just medial to the major wound area. 07/22/17; after discussion we decided not to reorder any further Apligraf's although there is been considerable improvement with these it hasn't been recently. The major wound anteriorly looks better. Smaller wounds beneath this and the more recent one and laterally look about the same. The area on the  right lateral lower leg looks about the same 07/29/17 this is a patient who is exceedingly complex. She has advanced scleroderma, crest syndrome including calcinosis, PAD status post revascularization, chronic venous insufficiency status post ablations. She initially presented to this clinic with wounds on her bilateral lower legs however these closed. More recently we have been dealing with a large open area superiorly on the right anterior leg, a smaller wound underneath this and then another one on the right medial lower extremity. These improve significantly with 10 Apligraf applications. Over the last 2-3 weeks we are making good progress with Hydrofera Blue and these seem to be making progress towards closure 08/19/17; wounds continued to make good improvement with Hydrofera Blue and episodic aggressive debridements 08/26/17; still using Hydrofera Blue. Good improvements 09/09/17; using Hydrofera Blue continued improvement. Area on the lateral part of her right leg has only a small remaining open area. The small inferior satellite region is for all intents and purposes closed. Her major wound also is come in in terms of depth and has advancing epithelialization. 09/16/17; using Hydrofera Blue with continued improvement. The smaller satellite wound. We've closed out today along with a new were satellite wound medially. The area on her medial ankle is still open and her major wound is still open but making improvement. All using Hydrofera Blue. Currently 09/30/17; using Hydrofera Blue. Still a small open area on the lateral right ankle area and her original major wound seems to be making gradual and steady improvement. 10/14/17; still using Hydrofera Blue. Still too small open areas on the right lateral ankle. Her original major wound is horizontal and linear. The most problematic area paradoxically seems to be the area to the medial area wears I thought it would be the lateral. 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 10/28/17; still using Hydrofera Blue. Right lateral ankle has a very small open area superiorly on the most lateral part of the wound. Her original open wound has 2 open areas now separated by normal skin and we've redefined this.  11/11/17; still using Hydrofera Blue area and right lateral ankle continues to have a small open area on mostly the lateral part of the wound. Her original wound has 2 small open areas now separated by a considerable amount of normal skin 11/28/17; the patient called in slightly before Thanksgiving to report pain and erythema above the wound on the right leg. In the past this is responded well to treatment for cellulitis and I gave her over the phone doxycycline. She stated this resulted in fairly abrupt improvement. We have been using Hydrofera Blue for a prolonged period of time to the larger wound anteriorly into the remaining wound on her right right lateral ankle. The latter is just about closed with only a small linear area and the bottom of the [place]. 12/02/17; use endoform and left the dressing on since last visit. There is no tenderness and no evidence of infection. 12/16/17; patient has been using Endoform but not making much progress. The 2 punched out open areas anteriorly which were the reminiscence of her major wound appear deeper. The area on the lateral aspect of her right calf also appears deeper. Also she has a puzzling tender swelling above her wound on the right leg. This seems larger than last time. Just above her wounds there appears to be some fluctuance in this area it is not erythematous and there is no crepitus 12/30/17; patient has been using Endoform up until last week we used Hydrofera Blue. Ultrasound of the swelling above her 2 major wounds last time was negative for a fluid collection. I gave her cefaclor for the erythema and tenderness in this area which seems better.  Unfortunately both punched out areas anteriorly and the area on her right medial lower leg appear deeper. In fact the lateral of the wounds anteriorly actually looks as though it has exposed tendon and/or muscle sheath. She is not systemically unwell. She is complaining of vaginitis type symptoms presumably Candida from her antibiotics. 01/06/18; we're using santyl. she has 2 punched out areas anteriorly which were initially part of a large wound. Unfortunately medially this is now open to tendon/muscle. All the wounds have the same adherent very difficult to debride surface. 01/20/18; 2 week follow-up using Santyl. She has the 2 punched out areas anteriorly which were initially part of her large surface wound there. Medially this still has exposed muscle. All of these have the same tightly adherent necrotic surface which requires debridement. PuraPly was not accepted by the patient's insurance however her insurance I think it changed therefore we are going to run Apligraf to gain 02/03/18; the patient has been using Santyl. The wound on the right lateral ankle looks improved and the 2 areas anteriorly on the right leg looks about the same. The medial one has exposed muscle. The lateral 1 requiresdebridement. We use PuraPly today for the first week 02/10/18; PuraPly #2. The patient has 3 wounds. The area on the right lateral ankle, 2 areas anteriorly that were part of her original large wound in this area the medial one has exposed muscle. All of the wounds were lightly debrided with a number 3 curet. PuraPly #2 applied the lateral wound on the calf and the right lateral ankle look better. 02/17/18; PuraPly #3. Patient has 3 wounds. The area on the right lateral ankle in 2 areas internally that were part of her original large wound. The lateral area has exposed muscle. She arrived with some complaints of pain around the right ankle. 02/24/18; PuraPly #4; not  much change in any of the 3 wound areas. Right  lateral ankle, right lateral calf. Both of these required debridement with a #3 curet. She tolerates this marginally. The area on the medial leg still has exposed muscle. Not much change in dimensions 03/03/18 PuraPly #5. The area on the medial ankle actually looks better however the 2 separate areas that were original parts of the larger right anterior leg wound look as though they're attempting to coalesce. 03/10/18; PuraPly #6. The area on the medial ankle actually continues to look and measures smaller however the 2 separate areas that were part of the original large wound on the right anterior leg have now coalesced. There hasn't been much improvement here. The lateral area actually has underlying exposed muscle 03/17/18-she is here in follow-up evaluation for ulcerations to the right lower extremity. She is voicing no complaints or concerns. She tolerated debridement. Puraply#7 placed 03/24/18; difficult right lower extremity ulcerations. PuraPly #8 place. She is been approved for [employer]. She did very well with Apligraf today however she is apparently reached her "lifetime max" 03/31/18; marginal improvement with PuraPly although her wounds looked as good as they have in several weeks today. Used TheraSkins #1 04/14/18 TheraSkin #2 today 04/28/18 TheraSkins #3. Wound slightly improved 05/12/18; TheraSkin skin #4. Wound response has been variable. 05/27/18 TheraSkin #5. Generally improvement in all wound areas. I've also put her in 3 layer compression to help with the severe venous hypertension 06/09/18; patient is done quite well with the TheraSkins unfortunately we have no further applications. I also put her in 3 layer compression last week and that really seems to of helped. 06/16/18; we have been using silver collagen. Wounds are smaller. Still be open area to the muscle layer of her calf however even that is contracted somewhat. She tells me that at night sometimes she has pain on the right  lateral calf at the site of her lower wound. Notable that I put her into 3 layer compression about 3 weeks ago. She states that she dangles her leg over the bed that makes it feel better but she does not describe claudication during the day She is going to call her secondary insurance to see if they will continue to cover advanced treatment products I have reviewed her arterial studies from 01/22/17; this showed an ABI in the right of 1 and on the left noncompressible. TBI on the right at 0.30 on the left at 0.34. It is therefore possible she has significant PAD with medial calcification falsely elevating her ABI into the normal range. I'll need to be careful about asking her about this next week it's possible the 3 layer compression is too much 06/23/18; was able to reapply TheraSkin 1 today. Edema control is good and she is not complaining of pain no claudication 07/07/18;no major change. New wound which was apparently a taper removal injury today in our clinic between her 2 wounds on the right calf TheraSkins #2 07/14/18; I think there is some improvement in the right lateral ankle and the medial part of her wound. There is still exposed muscle medially. 07/28/18; two-week follow-up. TheraSkins#3. Unfortunately no major change. 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 She seems to be doing well and in the base of the wound appears to show some progress at this point. This  is the last approved Theraskin of the second set. 08/25/18; she has completed TheraSkin. There has been some improvement on the right lateral calf wound as well as the anterior leg wounds. The open area to muscle medially on the anterior leg wound is smaller. I'm going to transition her back to [facility] under Kerlix Coban change every  second day. She reports that she had some calcification removed from her right upper arm. We have had previous problems with calcifications in her wounds on her legs but that has not happened recently 09/08/18;using Hydrofera Blue on both her wound areas. Wounds seem to of contracted nicely. She uses Kerlix Coban wrap and changes at home herself 09/22/2018; using Hydrofera Blue on both her wound areas. Dimensions seem to have come down somewhat. There is certainly less depth in the medial part of the mid tibia wound and I do not think there is any exposed muscle at this point. 10/06/2018; 2-week follow-up. Using [facility] on her wound areas. Dimensions have come down nicely both on the right lateral ankle area in the right mid tibial area. She has no new complaints 10/20/2018; 2-week follow-up. She is using Hydrofera Blue. Not much change from the last time she was here. The area on the lateral ankle has less depth although it has raised edges on one side. I attempted to remove as much of the raised edge as I could without creating more additional wounds. The area on the right anterior mid tibia area looks the same. 11/03/2018; 2-week follow-up she is using Hydrofera Blue. On the right anterior leg she now has 2 wounds separated by a large area of normal skin. The area on the medial part still has I think exposed muscle although this area itself is a lot smaller. The area laterally has some depth. Both areas with necrotic debris. The area on her right lateral ankle has come in nicely 11/17/2018; patient continues to use Hydrofera Blue. We have been increasing separation of the 2 wounds anteriorly which were at one point joint the area on the right lateral calf continues to have I think some improvement in depth. 12/08/2018; patient continues to use Hydrofera Blue. There is some improvement in the area on the right lateral calf. The 2 areas that were initially part of the original large wound in the  mid right tibia are probably about the same. In fact the medial area is probably somewhat larger. We will run puraply through the patient's insurance 12/22/2018; she has been using Hydrofera Blue. We have small wounds on the right lateral calf and 2 small areas that were initially part of a large wound in the right mid tibia. We applied pure apply #1 today. 12/29/2018; we applied puraply #2. Her wounds look somewhat better especially on the right lateral calf and the lateral part of her original wound in the mid tibial area. 01/05/2019; perhaps slightly improved in terms of wound bed condition but certainly not as much improvement as I might of liked. Puraply #3 1/13: we did not have a correct sized puraply to apply. wounds more pinched out looking, I increased her compression to 3 layer last week to help with significant multilevel venous hypertension. Since then I've reviewed her arterial status. She has a right femoral endarterectomy and a distal left SFA stent. She was being followed by Dr. [name] for a period however she does not appear to have seen him in 3 years. I will set up an appointment. 1/20. The patient has an  additional wound on the right lateral calf between the distal wound and proximal wounds. We did not have Puraply last week. Still does not have a follow-up with Dr. early 1/27: Follow-up with Dr. early has been arranged apparently with follow-up noninvasive studies. Wounds are measuring roughly the same although they certainly look smaller 2/3; the patient had non invasive studies. Her ABIs on the right were 0.83 and on the left 1.02 however there was no great toe pressure bilaterally. Also worrisome monophasic waveforms at the PTA and dorsalis pedis. We are still using Puraply. We have had some improvement in all of the wounds especially the lateral part of the mid tibial area. 2/10; sees Dr. early of vein and vascular re-arterial studies next week. Puraply reapplied today. 2/17;  Dr. early of vein and vascular his appointment is tomorrow. Puraply reapplied after debridement of all wounds 2/24; the patient saw Dr. early I reviewed his note. sHe noted the previous right femoral endarterectomy with a Dacron patch. He also noted the normal ABI and the monophasic waveforms suggesting tibial disease. Overall he did not feel that she had any evidence of arterial insufficiency that would impair her wound healing. He did note her venous disease as well. He suggested PRN follow-up. 3/2; I had the last puraply applied today. The original wounds over the mid tibia area are improved where is the area on the right lower leg is not 3/9; wounds are smaller especially in the right mid tibia perhaps slightly in the right lateral calf. We finished with puraply and went to endoform today 3/23; the patient arrives after 2 weeks. She has been using endoform. I think all of her wounds look slightly better which includes the area on the right lateral calf just above the right lateral malleolus and the 2 in the right mid tibia which were initially part of the same wound. 4/27; TELEHEALTH visit; the patient was seen for telehealth visit today with her consent in the middle of the worldwide epidemic. Since she was last here she called in for antibiotics with pain and tenderness around the area on the right medial ankle. I gave her empiric doxycycline. She states this feels better. She is using endoform on both of these areas 5/11 TELEHEALTH; the patient was seen for telehealth visit today. She was accompanied at home by her husband. She has severe pulmonary hypertension accompanied scleroderma and in the face of the Covid epidemic cannot be safely brought into our clinic. We have been using endoform on her wound areas. There are essentially 3 wound areas now in the left mid tibia now 2 open areas that it one point were connected and one on the right lateral ankle just above the malleolus. 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 She cannot come to clinic. The wounds were reviewed today via telehealth. She has severe chronic venous hypertension which I think is centrally mediated. She has wounds on her right anterior tibia and right lateral ankle area. These are chronic. She has been using endoform. 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 We have been following her from telehealth. She has severe chronic venous hypertension which may be mostly centrally mediated secondary to right heart heart failure.  She has wounds on her right anterior tibia and right lateral ankle these are chronic we have been using endoform 6/22; TELEHEALTH; this patient was seen today via telehealth. 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 Since the last time we had contact with this patient she developed some pain and erythema around the wound on her right lateral malleolus/ankle and we put in antibiotics for her. This is resulted in good improvement with resolution of the erythema and tenderness. I changed her to silver alginate last time. We had been using endoform for an extended period of time 7/13; TELEHEALTH; this patient was seen today via telehealth. 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 She would continue to be at a prohibitive risk to be brought into our clinic unless this was absolutely necessary. These visits have been done with her approval as well as her husband. We have been using silver alginate to the areas on the right mid tibia and right lateral lower leg. 7/27  TELEHEALTH; patient was seen along with her husband today via telehealth. 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 We changed her to sample last visit. She has 2 areas a chronic wound on her right mid tibia and one just above her ankle. These were not the original wounds when she came into this clinic but she developed them during treatment 8/17; she comes in for her first face-to-face visit in a long period. She has a remaining area just medial to the right tibia which is the last open part of her large wound across this area. She also has an area on the right lateral lower leg. We prescribed Santyl last telehealth visit but they were concerned that this was making a deeper so they put silver alginate on it last week. Her husband changes the dressings. 8/31; using Santyl to the 2 wound areas some improvement in wound surfaces. Husband has surgery in 2 weeks we will put her out 3 weeks. Any of the advanced treatment options that I can think of that would be eligible for this wound would also cause her to have to come in weekly. The risk that the patient is just too high 9/21. Using Santyl to the 2 wound areas. Both of these are somewhat better although the medial mid tibia area still has exposed muscle. Lateral ankle requiring debridement. Using Santyl 10/12; using Santyl to the 2 wound areas. One on the right lateral ankle and the other in the medial calf which still has exposed muscle. Both areas have come down in size and have better looking surfaces. She has made nice progress with santyl 11/2; we have been using Santyl to the 2 wound areas. Right lateral ankle and the other in the mid tibia area the medial part of this still has exposed muscle. 11/23/2019 on evaluation today patient appears to be doing about the same really with regard to her wounds. She is actually not very pleased with how things seem to  be progressing at this point she tells me that she really has not noted much improvement unfortunately. With that being said there is no signs of active infection at this time. There is some slough buildup noted at this time which again along with some dry skin around the edges of the wound I think would benefit her to  try to debride some of this away. Fortunately her pain is doing fairly well. She still has exposed muscle in the right medial/tibial area. 12/14; TELEHEALTH; she was changed to [facility] to the right calf wound and right lateral ankle wound when she was here last time. Unfortunately since then she had a fall with a pelvic fracture and a fracture of her wrist. She was apparently hospitalized for 5 days. I have not looked at her discharge summary. She apparently came out of the hospital with a blister on her right heel. She was seen today via telehealth by myself and our case manager. The patient and her husband were present. She has been using Hydrofera Blue at our direction from the last time she was in the clinic. There is been no major improvement in fact the areas appear deeper and with a less viable surface 01/04/2020; TELEHEALTH; the patient was seen today in accompaniment of her husband and our nurse. She has 3 open areas 1 on the right medial mid tibia, one on the right lateral ankle and a large eschared pressure area on the right heel. We have been using Iodoflex to the 2 original wounds. 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 It is simply too perilous for her to be seen in any other way 1/26; TELEHEALTH; the patient was seen today in accompaniment of her husband and our RN one of our nurses. She still has the 3 open areas 1 on the right medial mid tibia which is the remanent of a more extensive wound in this area, one on the right lateral ankle and a large eschared pressure area on the right heel. We have been using Iodoflex to the 2  original wounds and a bed at 9 application to the eschared area on the heel 2/15; the first time we have seen this patient and then several months out of concern for the pandemic. She had a large horizontal wound in the mid tibia. Only the medial aspect of this is still open. Area on the lateral ankle is just about closed. She had a new pressure ulcer on the right heel which I have removed some of the eschar. We have been using Iodoflex which I will continue. The area in the mid tibia has a round circle in the middle of exposed muscle. I think we would have to use an advanced treatment product to stimulate granulation over this area. We will run this through her insurance. She is not eligible for plastic surgery for many reasons 3/1; TELEHEALTH. The patient was seen today by telehealth. 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 We have been using Iodoflex to the wound areas which include the right anterior mid tibia, right lateral ankle and the right heel. All of these were reviewed 3/8; the patient's wound just above the right ankle is closed. She still has a contracting black eschar on the heel where she had a pressure ulcer. The medial part of her original wound on the mid tibia has exposed muscle. We have made really made no progress in this area although we have managed to get a lot of the original wound in this area to close. We used Apligraf #1 today 3/22; the patient's ankle wound remains closed. She still has a contracting black eschar on the heel although it seems to have less surface area. It still not clear whether there is any depth  here. We used Apligraf #2 today in an attempt to get granulation over the exposed muscle and what is left of her mid tibia area. 4/5; everything is closed except the medial aspect of the mid tibia wound, as well as the pressure injury on the right heel. She has been  using Betadine to the right heel which has been gradually contracting. We used Apligraf #3 today. 4/19; Apligraf #4. Still a pressure area on the right heel she has been using Betadine superficial excoriated areas she has been applying salicylic acid to on the anterior leg and the thick horn on the leg just above her wound area 5/3; Apligraf #5. Unfortunately she came in today with a reopening of the area on the right lateral lower leg. Not much change in the wound we have been treating in the mid calf. Quite a bit of edema surrounding this wound. She reports that she was up on her feet quite a bit. The area on the right heel is separating she has been using Betadine She comes in with a new wound on the left lateral malleolus which is very disappointing this is been open since last week 5/17; we applied her last Apligraf last time. Unfortunately that did not have any effect on the deep area medially in the mid tibial area. However the area over the right lateral malleolus is a lot better. Right heel also is contracted we used Iodoflex last time. The new wound on the left lateral malleolus were using Santyl to. This required debridement. 6/1. Not much change in the area on the right medial mid tibia. Right lateral ankle is improved she has the necrotic area on the right heel which was a pressure area. New area on the left lateral malleolus from last time. I changed her to Iodoflex to the area on the left lateral malleolus she said this hurt I have put her back on Santyl Electronic Signature(s) Signed: 05/31/2020 5:52:05 PM By: Linton Ham MD Entered By: Linton Ham on 05/31/2020 13:03:37 -------------------------------------------------------------------------------- Physical Exam Details Patient Name: Date of Service: [name], [name] 05/31/2020 11:00 A M Medical Record Number: [id number] Patient Account Number: [id number] Date of Birth/Sex: Treating RN: 1951-10-18 (69 y.o. [name] [name] Primary Care Provider: Alton [name], [place] RY Other Clinician: Referring Provider: Treating Provider/Extender: [name] Drafts, MA RY Weeks in Treatment: 865 Constitutional Patient is hypertensive.. Pulse regular and within target range for patient.Marland Kitchen Respirations regular, non-labored and within target range.. Temperature is normal and within the target range for the patient.Marland Kitchen Appears in no distress. Notes Wound exam Midportion of the right tibia medially. Absolutely no change in the still exposed muscle. Reopening of the right lateral malleolus from last week seems better Left lateral malleolus still in considerable amount of debris I did not debride this today mechanically. The area on the right heel has a necrotic surface. This is in the process of loosening on its own. No debridement of this either Electronic Signature(s) Signed: 05/31/2020 5:52:05 PM By: Linton Ham MD Entered By: Linton Ham on 05/31/2020 13:04:58 -------------------------------------------------------------------------------- Physician Orders Details Patient Name: Date of Service: [name], [name] 05/31/2020 11:00 A M Medical Record Number: [id number] Patient Account Number: [id number] Date of Birth/Sex: Treating RN: 1951-07-09 (69 y.o. [name] [name] Primary Care Provider: Alton [name], [place] RY Other Clinician: Referring Provider: Treating Provider/Extender: [name] Drafts, MA RY Weeks in Treatment: [phone] Verbal / Phone Orders: No Diagnosis Coding Follow-up Appointments Return  Appointment in 2 weeks. Nurse Visit: - 1 week for rewrap Dressing Change Frequency Do not change entire dressing for one week. - right leg/heel Wound #20 Left,Lateral Malleolus Change dressing every day. Skin Barriers/Peri-Wound Care Moisturizing lotion - both legs Wound Cleansing May shower with protection. Wound #20 Left,Lateral Malleolus May shower and wash wound with soap and water. Primary Wound  Dressing Wound #16 Right Calcaneus Iodoflex Wound #19 Right,Lateral Malleolus Silver Collagen - moisten with hydrogel Wound #20 Left,Lateral Malleolus Santyl Ointment Wound #5 Right,Medial Lower Leg Silver Collagen - moisten with hydrogel Secondary Dressing Wound #16 Right Calcaneus Dry Gauze Heel Cup Wound #19 Right,Lateral Malleolus daptic Dressing - secure with steri strips A Dry Gauze Wound #20 Left,Lateral Malleolus Foam Border Wound #5 Right,Medial Lower Leg daptic Dressing - secure with steri strips A Dry Gauze Edema Control 3 Layer Compression System - Right Lower Extremity Avoid standing for long periods of time Elevate legs to the level of the heart or above for 30 minutes daily and/or when sitting, a frequency of: - throughout the day Off-Loading Turn and reposition every 2 hours Other: - float heels off of bed/chair with pillow under calves Electronic Signature(s) Signed: 05/31/2020 5:52:05 PM By: Linton Ham MD Signed: 06/01/2020 4:36:43 PM By: Carlene Coria RN Entered By: Carlene Coria on 05/31/2020 12:13:59 -------------------------------------------------------------------------------- Problem List Details Patient Name: Date of Service: [name], [name] 05/31/2020 11:00 A M Medical Record Number: [id number] Patient Account Number: [id number] Date of Birth/Sex: Treating RN: [birth year] (69 y.o. 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 02/24/2015 No  Yes I87.331 Chronic venous hypertension (idiopathic) with ulcer and inflammation of right 10/04/2016 No Yes lower extremity L89.616 Pressure-induced deep tissue damage of right heel 12/14/2019 No Yes L97.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 05/31/2020 No Yes Inactive Problems ICD-10 Code Description Active Date Inactive Date 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 05/31/2020 11:00 A M Medical Record Number: [id number] Patient Account Number: [id number] Date of Birth/Sex: Treating RN: Apr 30, 1951 (69 y.o. [name] [name] Primary Care Provider: Alton [name], [place] RY Other Clinician: Referring Provider: Treating Provider/Extender: [name] Drafts, MA RY Weeks in Treatment: 427 Subjective History of Present Illness (HPI) this is a patient who initially came to [place] for wounds on the medial malleoli bilaterally as well as her upper medial lower extremities bilaterally. These wounds eventually healed with assistance of Apligraf's bilaterally. While this was occurring she developed the current wound which opened into a fairly substantial wound on the right lower extremity. These are mostly secondary to venous stasis physiology however the patient also has underlying scleroderma, pulmonary hypertension. The wound has been making good progress lately with the Hydrofera Blue-based dressing. 03/17/2015; patient had  a arterial evaluation a year ago. Her right ABI was 0.86 left was 1.0. T brachial index was  0.41 on the right 0.45 on the left. Her oe bilateral common femoral artery waveforms were triphasic. Her white popliteal posterior tibial artery and anterior tibial artery waveforms were monophasic with good amplitude. Luteal artery waveforms were biphasic it was felt that her bilateral great toe pressures are of normal although adequate for tissue healing. 03/24/2015. The condition of this wound is not really improved that. He was covered with as fibrinous surface slough and eschar. This underwent an aggressive debridement with both a curette and scalpel. I still cannot really get down to what I can would consider to be a viable surface. There is no evidence of infection. Previous workup for ischemia roughly a year ago was negative nevertheless I think that continues to be a concern 04/07/15. The patient arrived for application of second Apligraf. Once again the surface of this wound is certainly less viable than I would like for an advanced treatment option. An aggressive debridement was done. She developed some arteriolar bleeding which required that along pressure and silver alginate. 04/21/15: change in the condition of this wound. Once again it is covered in a gelatinous surface slough. After debridement today surface of the wound looks somewhat better but now a heeling surface 05/05/15 Apligraf #4 applied.Still a lot of slough on this wound. 05/19/15 Apligraf #5 applied. Still a lot of slough on this wound I did not aggressively remove this 06/02/15; continued copious amounts of surface slough. This debridement fairly easily. 06/08/2015 -- the last time she had a venous duplex study done was over 3 years ago and the surgery was prior to that. I have recommended that she sees Dr. early for a another opinion regarding a repeat venous duplex and possibly more endovenous ablation of vein stripping of micro-phlebotomies. 06/16/15; wound has a gelatinous surface eschar that the debridement fairly easily to a  point. I don't disagree with the venous workup and perhaps even arterial re-evaluation. She is on prednisone 5 mg and continue his medications for her pulmonary artery hypertension I am not sure if the latter have any wound care healing issues I would need to investigate this. 06/23/15 continues with a gelatinous surface eschar with of fibrinous underlying. What I can see of this wound does not look unhealthy however I just can't get this material which I think is 2 different layers off. Empiric culture done 06/30/15; unfortunately not a lot of change in this wound. A gelatinous surface eschar is easily removed however it has a tight fibrinous surface underneath the. 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 The wound comes back and basically and unchanged state the. She has a gelatinous surface that is more easily removed however there is a tightly fibrinous surface underneath the. There is no evidence of infection. She has a vascular follow-up next month. I would have to inject her in order to do a more aggressive debridement of this area 07/21/15 the wound is roughly in the same state albeit the debridement is done with greater ease. There is less of the fibrinous eschar underneath the. There is no evidence of infection. She has follow-up with vascular surgery next week. No evidence of surrounding infection. Her original distal wounds healed while this one formed. 07/28/15; wound is easier to debride. No wound erythema. She is seeing Dr. [name] next week. 08/18/15 Has been to [place] for repeat ablation. Have been  using medihoney pad with some improvement 08/25/15; absolutely no change in the condition of this wound in either its overall size or surface condition in many months now. At one point I had this down to [place] healthier surface I think with [facility] however this did not actually progress towards closure. Do not believe that the wound has actually deteriorated in  terms of volume at all. We have been using a medihoney pad which allows easier debridement of the gelatinous eschar but again no overall actual improvement. the patient is going towards an ablation with pain and vascular which I think is scheduled for next week. The only other investigation that I could first see would be a biopsy. She does have underlying scleroderma 09/02/15 eschar is much easier to debridement however the base of this does not look particularly vibrant. We changed to Iodoflex. The patient had her ablation earlier this week 09/09/15 again the debridement over the base of this wound is easier and the base of the wound looks considerably better. We will continue the Iodoflex. Dr. Tawni Millers has expressed his satisfaction with the result of her ablation 09/15/15 once again the wound is relatively free of surface eschar. No debridement was done today. It has a pale-looking base to it. although this is not as deep as it once was it seems to be expanding especially inferiorly. She has had recent venous ablation but this is no closer to healing.I've gone ahead and done a punch biopsy this from the inferior part of the wound close to normal skin 09/22/15: the wound is relatively free of surface eschar. There is some surrounding eschar. I'm not exactly sure at what level the surface is that I am seeing. 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 No evidence of atypical infection, malignancy. 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 No debridement was done 09/29/15; the wound surface is relatively free of surface eschar. There is eschar surrounding the walls of the wound. Aside from the improvement in the amount of surface slough. This wound has not progressed any towards closure. There is not even a surface that looks like there at this is ready. There is no evidence of any infection nor maligancy based on biopsy I did on 9/15. I continued with  the Iodoflex however I am looking towards some alternative to try to promote some closure or filling in of this surface. Consider triple layer Oasis. Collagen did not result in adequate control of the surface slough 10/13/15; the patient was in hospital last week with severe anemia. The wound looks somewhat better after debridement although there is widening medially. There is no evidence of infection. 10/20/15; patient's wound on the right lateral lower leg is essentially unchanged. This underwent a light surface debridement and in general the debridement is easier and the surface looks improved. I noted in doing this on the side of the wound what appears to be a piece of calcium deposition. The patient noted that she had previous things on the tips of her fingers. 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 She follows with rheumatology and I have asked them to talk to her about this. In view of that S the nonhealing ulcer may have something to do with calcinosis and also unrelieved Raynaud's disease in this area. I should note that her original wounds on the right and left medial malleolus and the inner aspects of both legs just below  the knees did however heal over 10/30/15; the patient's wound on the right lateral lower leg is essentially unchanged. I was able to remove some calcified material from the medial wound edge. 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 Otherwise the wound appears essentially unchanged there is less adherent surface eschar. Some of the calcified material was sent to pathology for analysis 11/17/15. The patient's wound on the right lateral leg is essentially unchanged. Wider Medially. 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 There is some surface slough but I did not debridement this. No evidence of surrounding infection. 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 This will make healing difficult 12/15/15; the wound has less depth surface has a fibrinous slough and calcifications in the wound edge no evidence of infection 12/22/15; the wound definitely has less Fibrinous slough on the base. Calcifications around the wound edges are still evident. Although the wound bed looks healthier it is still pale in appearance. Previous biopsy did not show malignancy 01/04/15; surgical debridement of nonviable slough and subcutaneous tissue the wound cleans up quite nicely but appears to be expanding outward calcifications around the wound edges are still there. 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 She is to see her rheumatologist I'll see if he has any opinions on this. My punch biopsy done in September did not show calcifications although these are clearly evident. 01/19/16 light selective debridement of nonviable surface slough. There is epithelialization medially. This gives me reason for cautious optimism. She has been to see her rheumatologist, there is nothing that can be done for this type of soft tissue calcification associated with scleroderma 02/02/16 no debridement although there is a light surface slough. She has 2 peninsulas of skin 1 inferiorly and one medially. We continue to make a slight and slow but definite progressive here 02/16/16; light surface debridement with more attention to the circumference of the wound bed where the fibrinous eschar is more prevalent. No calcifications detected. She seems to have done nicely with the [facility] with some epithelialization and some improvement in the overall wound  volume. 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 Continued attention to the circumference of the wound where the fibrinous eschar in calcinosis or prevalent. No calcifications were detected. She has continued improvement with Hydrofera Blue. The wound is no longer as deep 03/15/16 surgical debridement done to remove surface escha Especially around the circumference of the wound where there is nonviable subcutaneous tissue. In spite of this there is considerable improvement in the overall dimensions and depth of the wound. Islands of epithelialization are seen especially medially inferiorly and superiorly to a lesser extent. She is using Hydrofera Blue at home 03/29/16; surgical debridement done to remove surface eschar and nonviable subcutaneous tissue. This cleans up quite nicely mention slightly larger in terms of length and width however depth is less 04/12/16; continued gradual improvement in terms of depth and the condition of the wound base. Debridement is done. Continuing long standing Hydrofera Blue at home with Kerlix Coban wraps 04/26/16; continued gradual improvement in terms of depth and  management as well as condition of the wound base. Surgical debridement done she continues with Hydrofera Blue. 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 She initially came to this clinic venous insufficiency ulcers which have long since healed 05/17/16 continued improvement in terms of the depth and measurements of this wound although she has a tightly adherent fibrinous slough each time. We've been continued with long standing Hydrofera Blue which seems to done as well for this wound is many advanced treatment options. 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 She also has chronic venous  insufficiency. She has an irritation on her lateral right ankle secondary to our wraps 05/10/16; wound appears to be smaller especially on the medial aspect and especially in the width. Wound was debridement surface looks better. She is also been in the hospital apparently with anemia again she tells me she had an endoscopy. Since she got home after 3 days which I believe was sometime last week she has had an irritated painful area on the right lateral ankle surrounding the lateral malleolus 05/31/16; much more adherent surface slough today then recently although I don't think the dimensions of changed that much. A more aggressive debridement is required. The irritated area over her medial malleolus is more pruritic and painful and I don't think represents cellulitis 06/14/16; no major change in her wound dimensions however there is more tightly adherent surface slough which is disappointing. As well as she appears to have a new small area medially. Furthermore an irritated uncomfortable area on the lateral aspect of her right foot just below the lateral malleolus. 06/21/16; I'm seeing the patient back in follow-up for the new areas under her major wound on the right anterior leg. She has been using Hydrofera Blue to this area probably for several months now and although the dressings seem to be helping for quite a period of time I think things have stagnated lately. She comes in today with a relatively tight adherent surface slough and really no changes in the wound shape or dimension. The 2 small areas she had inferiorly are tiny but still open they seem improved this well. There is no uncontrolled edema and I don't think there is any evidence of cellulitis. 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 Patient has had vascular evaluations both venous and arterial. I have biopsied this area. There is  no obvious infection. The worrisome thing today is that she seems to be developing areas of erythema and epithelial damage on the medial aspect of the right foot. Also to a lesser degree inferior to the actual wound itself. Again I see no obvious changes to suggest cellulitis however as this is the only treatable option I will probably give her antibiotics. 07/13/16 no major change in the lady's large anterior right leg wound. Still covered with a very tightly adherent surface slough which is difficult to debridement. There is less erythema around this, culture last week grew pseudomonas I gave her ciprofloxacin. The area on her lateral right malleolus looks better- 08/02/16 the patient's wounds continued to decline. Her original large anterior right leg wound looks deeper. Still adherent surface slough that is difficult to debridement. She has a small area just below this and a punched-out wound just below her lateral malleolus. In the meantime she is been in hospital with apparently an upper  GI bleed on Plavix and aspirin. She is now just on Plavix she received 3 units of packed cells 08/23/16; since I last saw this 3 weeks ago, the open large area on her right leg looks about the same syrup. She has a small satellite lesion just underneath this. The area on her medial right ankle is now a deep necrotic wound. I attempted to debridement this however there is just too much pain. 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 She follows with vascular surgery and is scheduled for an angiogram in early September 09/06/16; the patient is going for an arteriogram tomorrow. Her original large wound on the right calf is about the same the satellite lesion underneath it is about the same however the area on her medial ankle is now deeper with exposed tendon. I am no longer attempting to debride these wounds 09/20/16; the patient has undergone a  right femoral endarterectomy and Dacron patch angioplasty. This seems to have helped the flow in her right leg. 10/04/16; Arrived today for aggressive debridement of the wounds on her right calf the original wound the one beneath it and a difficult area over her right lateral leg just above the lateral malleolus. 10/25/16; her 3 open wounds are about the same in terms of dimensions however the surface appears a lot healthier post debridement. Using Iodoflex 10/18/16 we have been using Iodoflex to her wounds which she tolerated with some difficulty. 10/11/16; has been using Santyl for a period of time with some improvement although again very adherent surface slough would prevent any attempted healing this. She has a original wound on the left calf, the satellite underneath that and the most recent wound on the right medial ankle. She has completed revascularization by Dr. early and has had venous ablation earlier. Want to go back to Iodoflex to see if week and get a healthier surface to this wound bed failing this I think she'll need to be taken to the OR and I am prepared to call Dr. Marla Roe to discuss this. She is obviously not a good candidate for general anesthesia however.; 11/08/16; I put her on Iodoflex last time to see if I can get the wound bed any healthier and unfortunately today still had tremendous surface slough. 11/15/16; 4 weeks' worth of Iodoflex with not much improvement. Debridement on the major wounds on her left anterior leg is easier however this does not maintain from week to week. The punched-out area on her medial right ankle 11/29/16; I attempted to change the patient last visit to [facility] however she states this burned and was very uncomfortable therefore we gave her permission to go back to the eye out of complex which she already had at home. Also she noted a lot of pain and swelling on the lateral aspect of her leg before she traveled to [facility]  for the holidays, I called her in doxycycline over the phone. This seems to have helped 12/06/16; Wounds unchanged by in large. Using Iodoflex 12/13/16; her wounds today actually looks somewhat better. The area on the right lateral lower leg has reasonably healthy-looking granulation and perhaps as actually filled and a bit. Debridement of the 2 wounds on the medial calf is easier and post debridement appears to have a healthier base. We have referred her to Dr. Migdalia Dk for consideration of operative debridement 12/20/16; we have a quick note from Dr. Merri Ray who feels that  the patient needs to be referred to an academic center/plastic surgeon. This is due to the complexity of the patient's medical issues as it applies to anything in the OR. We have been using Iodoflex 12/27/16; in general the wounds on the right leg are better in terms of the difficult to remove surface slough. She has been using Iodoflex. She is approved for Apligraf which I anticipated ordering in the next week or 2 when we get a better-looking surface 01/04/16 the deep wounds on the right leg generally look better. Both of them are debrided further surface slough. The area on the lateral right leg was not debrided. She is approved for Apligraf I think I'll probably order this either next week or the week after depending on the surface of the wounds superiorly. We have been using Iodoflex which will continue until then 01/11/16; the deep wounds on the right leg again have a surface slough that requires debridement. I've not been able to get the wound bed on either one of these wounds down to what would be acceptable for an advanced skin stab-like Apligraf. The area on the medial leg has been improving. We have been using hot Iodoflex to all wounds which seems to do the best at at least limiting the nonviable surface 01/24/17; we have continued Iodoflex and all her wound areas. Her debridement Gen. he is easier and the surface  underneath this looks viable. Nevertheless these are large area wounds with exposed muscle at least on the anterior parts. We have ordered Apligraf's for 2 weeks from now. The patient will be away next week 02/07/17; the patient was close to have first Apligraf today however we did not order one. I therefore replaced her Iodoflex. She essentially has 3 large punched- out areas on her right anterior leg and right medial ankle. 02/11/17; Apligraf #1 02/25/17; Apligraf #2. In general some improvement in the right medial ankle and right anterior leg wounds. The larger wound medially perhaps some better 03/11/17; Apligraf #3. In general continued improvement in the right medial ankle and the right anterior leg wounds. 03/25/17 Apligraf #4. In general continued improvement especially on the right medial ankle and the lower 04/08/17; Apligraf #5 in general continued improvement in all wound sites. 04/22/17; post Apligraf #5 her wound beds continued to look a lot better all of them up to the surface of the surrounding skin. Had a caramel-colored slough that I did not debrided in case there is residual Apligraf effect. The wounds are as good as I have seen these looking quite some time. 04/29/17; we applied [place] and last week after completing her Apligraf. Wounds look as though they've contracted somewhat although they have a nonviable surface which was problematic in the past. Apparently she has been approved for further Apligraf's. We applied Iodosorb today after debridement. 05/06/17; we're fortunate enough today to be able to apply additional Apligraf approved by her insurance. In general all of her wounds look better Apligraf #6 05/24/17; Apligraf #7 continued improvement in all wounds o3 06/10/17 Apligraf #8. Continued improvement in the surface of all wounds. Not much of an improvement in dimensions as I might a follow 06/24/17 Apligraf #9 continued general improvement although not as much change in the  wound areas I might of like. She has a new open area on the right anterior lateral ankle very small and superficial. She also has a necrotic wound on the tip of her right index finger probably secondary to severe uncontrolled Raynaud's phenomenon. She  is already on sildenafil and already seen her rheumatologist who gave her Keflex. 07/08/17; Apligraf #10. Generalized improvement although she has a small additional wound just medial to the major wound area. 07/22/17; after discussion we decided not to reorder any further Apligraf's although there is been considerable improvement with these it hasn't been recently. The major wound anteriorly looks better. Smaller wounds beneath this and the more recent one and laterally look about the same. The area on the right lateral lower leg looks about the same 07/29/17 this is a patient who is exceedingly complex. She has advanced scleroderma, crest syndrome including calcinosis, PAD status post revascularization, chronic venous insufficiency status post ablations. She initially presented to this clinic with wounds on her bilateral lower legs however these closed. More recently we have been dealing with a large open area superiorly on the right anterior leg, a smaller wound underneath this and then another one on the right medial lower extremity. These improve significantly with 10 Apligraf applications. Over the last 2-3 weeks we are making good progress with Hydrofera Blue and these seem to be making progress towards closure 08/19/17; wounds continued to make good improvement with Hydrofera Blue and episodic aggressive debridements 08/26/17; still using Hydrofera Blue. Good improvements 09/09/17; using Hydrofera Blue continued improvement. Area on the lateral part of her right leg has only a small remaining open area. The small inferior satellite region is for all intents and purposes closed. Her major wound also is come in in terms of depth and has advancing  epithelialization. 09/16/17; using Hydrofera Blue with continued improvement. The smaller satellite wound. We've closed out today along with a new were satellite wound medially. The area on her medial ankle is still open and her major wound is still open but making improvement. All using Hydrofera Blue. Currently 09/30/17; using Hydrofera Blue. Still a small open area on the lateral right ankle area and her original major wound seems to be making gradual and steady improvement. 10/14/17; still using Hydrofera Blue. Still too small open areas on the right lateral ankle. Her original major wound is horizontal and linear. The most problematic area paradoxically seems to be the area to the medial area wears I thought it would be the lateral. 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 10/28/17; still using Hydrofera Blue. Right lateral ankle has a very small open area superiorly on the most lateral part of the wound. Her original open wound has 2 open areas now separated by normal skin and we've redefined this. 11/11/17; still using Hydrofera Blue area and right lateral ankle continues to have a small open area on mostly the lateral part of the wound. Her original wound has 2 small open areas now separated by a considerable amount of normal skin 11/28/17; the patient called in slightly before Thanksgiving to report pain and erythema above the wound on the right leg. In the past this is responded well to treatment for cellulitis and I gave her over the phone doxycycline. She stated this resulted in fairly abrupt improvement. We have been using Hydrofera Blue for a prolonged period of time to the larger wound anteriorly into the remaining wound on her right right lateral ankle. The latter is just about closed with only a small linear area and the bottom of the [place]. 12/02/17; use endoform and left the dressing on since last visit. There is no tenderness and no  evidence of infection. 12/16/17; patient has been using Endoform but  not making much progress. The 2 punched out open areas anteriorly which were the reminiscence of her major wound appear deeper. The area on the lateral aspect of her right calf also appears deeper. Also she has a puzzling tender swelling above her wound on the right leg. This seems larger than last time. Just above her wounds there appears to be some fluctuance in this area it is not erythematous and there is no crepitus 12/30/17; patient has been using Endoform up until last week we used Hydrofera Blue. Ultrasound of the swelling above her 2 major wounds last time was negative for a fluid collection. I gave her cefaclor for the erythema and tenderness in this area which seems better. Unfortunately both punched out areas anteriorly and the area on her right medial lower leg appear deeper. In fact the lateral of the wounds anteriorly actually looks as though it has exposed tendon and/or muscle sheath. She is not systemically unwell. She is complaining of vaginitis type symptoms presumably Candida from her antibiotics. 01/06/18; we're using santyl. she has 2 punched out areas anteriorly which were initially part of a large wound. Unfortunately medially this is now open to tendon/muscle. All the wounds have the same adherent very difficult to debride surface. 01/20/18; 2 week follow-up using Santyl. She has the 2 punched out areas anteriorly which were initially part of her large surface wound there. Medially this still has exposed muscle. All of these have the same tightly adherent necrotic surface which requires debridement. PuraPly was not accepted by the patient's insurance however her insurance I think it changed therefore we are going to run Apligraf to gain 02/03/18; the patient has been using Santyl. The wound on the right lateral ankle looks improved and the 2 areas anteriorly on the right leg looks about the same. The medial one has  exposed muscle. The lateral 1 requiresdebridement. We use PuraPly today for the first week 02/10/18; PuraPly #2. The patient has 3 wounds. The area on the right lateral ankle, 2 areas anteriorly that were part of her original large wound in this area the medial one has exposed muscle. All of the wounds were lightly debrided with a number 3 curet. PuraPly #2 applied the lateral wound on the calf and the right lateral ankle look better. 02/17/18; PuraPly #3. Patient has 3 wounds. The area on the right lateral ankle in 2 areas internally that were part of her original large wound. The lateral area has exposed muscle. She arrived with some complaints of pain around the right ankle. 02/24/18; PuraPly #4; not much change in any of the 3 wound areas. Right lateral ankle, right lateral calf. Both of these required debridement with a #3 curet. She tolerates this marginally. The area on the medial leg still has exposed muscle. Not much change in dimensions 03/03/18 PuraPly #5. The area on the medial ankle actually looks better however the 2 separate areas that were original parts of the larger right anterior leg wound look as though they're attempting to coalesce. 03/10/18; PuraPly #6. The area on the medial ankle actually continues to look and measures smaller however the 2 separate areas that were part of the original large wound on the right anterior leg have now coalesced. There hasn't been much improvement here. The lateral area actually has underlying exposed muscle 03/17/18-she is here in follow-up evaluation for ulcerations to the right lower extremity. She is voicing no complaints or concerns. She tolerated debridement. Puraply#7 placed 03/24/18; difficult right lower extremity ulcerations. PuraPly #  8 place. She is been approved for [employer]. She did very well with Apligraf today however she is apparently reached her "lifetime max" 03/31/18; marginal improvement with PuraPly although her wounds looked as good  as they have in several weeks today. Used TheraSkins #1 04/14/18 TheraSkin #2 today 04/28/18 TheraSkins #3. Wound slightly improved 05/12/18; TheraSkin skin #4. Wound response has been variable. 05/27/18 TheraSkin #5. Generally improvement in all wound areas. I've also put her in 3 layer compression to help with the severe venous hypertension 06/09/18; patient is done quite well with the TheraSkins unfortunately we have no further applications. I also put her in 3 layer compression last week and that really seems to of helped. 06/16/18; we have been using silver collagen. Wounds are smaller. Still be open area to the muscle layer of her calf however even that is contracted somewhat. She tells me that at night sometimes she has pain on the right lateral calf at the site of her lower wound. Notable that I put her into 3 layer compression about 3 weeks ago. She states that she dangles her leg over the bed that makes it feel better but she does not describe claudication during the day ooShe is going to call her secondary insurance to see if they will continue to cover advanced treatment products I have reviewed her arterial studies from 01/22/17; this showed an ABI in the right of 1 and on the left noncompressible. TBI on the right at 0.30 on the left at 0.34. It is therefore possible she has significant PAD with medial calcification falsely elevating her ABI into the normal range. I'll need to be careful about asking her about this next week it's possible the 3 layer compression is too much 06/23/18; was able to reapply TheraSkin 1 today. Edema control is good and she is not complaining of pain no claudication 07/07/18;no major change. New wound which was apparently a taper removal injury today in our clinic between her 2 wounds on the right calf TheraSkins #2 07/14/18; I think there is some improvement in the right lateral ankle and the medial part of her wound. There is still exposed muscle medially. 07/28/18;  two-week follow-up. TheraSkins#3. Unfortunately no major change. She is not a candidate I don't think for skin grafting due to severe venous hypertension associated with her scleroderma and pulmonary hypertension 08/11/18 Patient is here today for her [employer] application #97 (#5 of the second set). She seems to be doing well and in the base of the wound appears to show some progress at this point. This is the last approved [employer]in of the second set. 08/25/18; she has completed TheraSkin. There has been some improvement on the right lateral calf wound as well as the anterior leg wounds. The open area to muscle medially on the anterior leg wound is smaller. I'm going to transition her back to [facility] under Kerlix Coban change every second day. She reports that she had some calcification removed from her right upper arm. We have had previous problems with calcifications in her wounds on her legs but that has not happened recently 09/08/18;using Hydrofera Blue on both her wound areas. Wounds seem to of contracted nicely. She uses Kerlix Coban wrap and changes at home herself 09/22/2018; using Hydrofera Blue on both her wound areas. Dimensions seem to have come down somewhat. There is certainly less depth in the medial part of the mid tibia wound and I do not think there is any exposed muscle at this point. 10/06/2018;  2-week follow-up. Using [facility] on her wound areas. Dimensions have come down nicely both on the right lateral ankle area in the right mid tibial area. She has no new complaints 10/20/2018; 2-week follow-up. She is using Hydrofera Blue. Not much change from the last time she was here. The area on the lateral ankle has less depth although it has raised edges on one side. I attempted to remove as much of the raised edge as I could without creating more additional wounds. The area on the right anterior mid tibia area looks the same. 11/03/2018; 2-week follow-up she is using  Hydrofera Blue. On the right anterior leg she now has 2 wounds separated by a large area of normal skin. The area on the medial part still has I think exposed muscle although this area itself is a lot smaller. The area laterally has some depth. Both areas with necrotic debris. ooThe area on her right lateral ankle has come in nicely 11/17/2018; patient continues to use Hydrofera Blue. We have been increasing separation of the 2 wounds anteriorly which were at one point joint the area on the right lateral calf continues to have I think some improvement in depth. 12/08/2018; patient continues to use Hydrofera Blue. There is some improvement in the area on the right lateral calf. The 2 areas that were initially part of the original large wound in the mid right tibia are probably about the same. In fact the medial area is probably somewhat larger. We will run puraply through the patient's insurance 12/22/2018; she has been using Hydrofera Blue. We have small wounds on the right lateral calf and 2 small areas that were initially part of a large wound in the right mid tibia. We applied pure apply #1 today. 12/29/2018; we applied puraply #2. Her wounds look somewhat better especially on the right lateral calf and the lateral part of her original wound in the mid tibial area. 01/05/2019; perhaps slightly improved in terms of wound bed condition but certainly not as much improvement as I might of liked. Puraply #3 1/13: we did not have a correct sized puraply to apply. wounds more pinched out looking, I increased her compression to 3 layer last week to help with significant multilevel venous hypertension. Since then I've reviewed her arterial status. She has a right femoral endarterectomy and a distal left SFA stent. She was being followed by Dr. [name] for a period however she does not appear to have seen him in 3 years. I will set up an appointment. 1/20. The patient has an additional wound on the right  lateral calf between the distal wound and proximal wounds. We did not have Puraply last week. Still does not have a follow-up with Dr. early 1/27: Follow-up with Dr. early has been arranged apparently with follow-up noninvasive studies. Wounds are measuring roughly the same although they certainly look smaller 2/3; the patient had non invasive studies. Her ABIs on the right were 0.83 and on the left 1.02 however there was no great toe pressure bilaterally. Also worrisome monophasic waveforms at the PTA and dorsalis pedis. We are still using Puraply. We have had some improvement in all of the wounds especially the lateral part of the mid tibial area. 2/10; sees Dr. early of vein and vascular re-arterial studies next week. Puraply reapplied today. 2/17; Dr. early of vein and vascular his appointment is tomorrow. Puraply reapplied after debridement of all wounds 2/24; the patient saw Dr. early I reviewed his note. sHe noted the  previous right femoral endarterectomy with a Dacron patch. He also noted the normal ABI and the monophasic waveforms suggesting tibial disease. Overall he did not feel that she had any evidence of arterial insufficiency that would impair her wound healing. He did note her venous disease as well. He suggested PRN follow-up. 3/2; I had the last puraply applied today. The original wounds over the mid tibia area are improved where is the area on the right lower leg is not 3/9; wounds are smaller especially in the right mid tibia perhaps slightly in the right lateral calf. We finished with puraply and went to endoform today 3/23; the patient arrives after 2 weeks. She has been using endoform. I think all of her wounds look slightly better which includes the area on the right lateral calf just above the right lateral malleolus and the 2 in the right mid tibia which were initially part of the same wound. 4/27; TELEHEALTH visit; the patient was seen for telehealth visit today with her  consent in the middle of the worldwide epidemic. Since she was last here she called in for antibiotics with pain and tenderness around the area on the right medial ankle. I gave her empiric doxycycline. She states this feels better. She is using endoform on both of these areas 5/11 TELEHEALTH; the patient was seen for telehealth visit today. She was accompanied at home by her husband. She has severe pulmonary hypertension accompanied scleroderma and in the face of the Covid epidemic cannot be safely brought into our clinic. We have been using endoform on her wound areas. There are essentially 3 wound areas now in the left mid tibia now 2 open areas that it one point were connected and one on the right lateral ankle just above the malleolus. 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 She cannot come to clinic. The wounds were reviewed today via telehealth. She has severe chronic venous hypertension which I think is centrally mediated. She has wounds on her right anterior tibia and right lateral ankle area. These are chronic. She has been using endoform. 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 We have been following her from telehealth. She has severe chronic venous hypertension which may be mostly centrally mediated secondary to right heart heart failure. She has wounds on her right anterior tibia and right lateral ankle these are chronic we have been using endoform 6/22; TELEHEALTH; this patient was seen today via telehealth. 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 Since the last time we had contact  with this patient she developed some pain and erythema around the wound on her right lateral malleolus/ankle and we put in antibiotics for her. This is resulted in good improvement with resolution of the erythema and tenderness. I changed her to silver alginate last time. We had been using endoform for an extended period of time 7/13; TELEHEALTH; this patient was seen today via telehealth. 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 She would continue to be at a prohibitive risk to be brought into our clinic unless this was absolutely necessary. These visits have been done with her  approval as well as her husband. We have been using silver alginate to the areas on the right mid tibia and right lateral lower leg. 7/27 TELEHEALTH; patient was seen along with her husband today via telehealth. 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 We changed her to sample last visit. She has 2 areas a chronic wound on her right mid tibia and one just above her ankle. These were not the original wounds when she came into this clinic but she developed them during treatment 8/17; she comes in for her first face-to-face visit in a long period. She has a remaining area just medial to the right tibia which is the last open part of her large wound across this area. She also has an area on the right lateral lower leg. We prescribed Santyl last telehealth visit but they were concerned that this was making a deeper so they put silver alginate on it last week. Her husband changes the dressings. 8/31; using Santyl to the 2 wound areas some improvement in wound surfaces. Husband has surgery in 2 weeks we will put her out 3 weeks. Any of the advanced treatment options that I can think of that would be eligible for this wound would also cause her to have to come in weekly. The risk that the patient is just  too high 9/21. Using Santyl to the 2 wound areas. Both of these are somewhat better although the medial mid tibia area still has exposed muscle. Lateral ankle requiring debridement. Using Santyl 10/12; using Santyl to the 2 wound areas. One on the right lateral ankle and the other in the medial calf which still has exposed muscle. Both areas have come down in size and have better looking surfaces. She has made nice progress with santyl 11/2; we have been using Santyl to the 2 wound areas. Right lateral ankle and the other in the mid tibia area the medial part of this still has exposed muscle. 11/23/2019 on evaluation today patient appears to be doing about the same really with regard to her wounds. She is actually not very pleased with how things seem to be progressing at this point she tells me that she really has not noted much improvement unfortunately. With that being said there is no signs of active infection at this time. There is some slough buildup noted at this time which again along with some dry skin around the edges of the wound I think would benefit her to try to debride some of this away. Fortunately her pain is doing fairly well. She still has exposed muscle in the right medial/tibial area. 12/14; TELEHEALTH; she was changed to [facility] to the right calf wound and right lateral ankle wound when she was here last time. Unfortunately since then she had a fall with a pelvic fracture and a fracture of her wrist. She was apparently hospitalized for 5 days. I have not looked at her discharge summary. She apparently came out of the hospital with a blister on her right heel. She was seen today via telehealth by myself and our case manager. The patient and her husband were present. She has been using Hydrofera Blue at our direction from the last time she was in the clinic. There is been no major improvement in fact the areas appear deeper and with a less viable surface 01/04/2020;  TELEHEALTH; the patient was seen today in accompaniment of her husband  and our nurse. She has 3 open areas 1 on the right medial mid tibia, one on the right lateral ankle and a large eschared pressure area on the right heel. We have been using Iodoflex to the 2 original wounds. 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 It is simply too perilous for her to be seen in any other way 1/26; TELEHEALTH; the patient was seen today in accompaniment of her husband and our RN one of our nurses. She still has the 3 open areas 1 on the right medial mid tibia which is the remanent of a more extensive wound in this area, one on the right lateral ankle and a large eschared pressure area on the right heel. We have been using Iodoflex to the 2 original wounds and a bed at 9 application to the eschared area on the heel 2/15; the first time we have seen this patient and then several months out of concern for the pandemic. She had a large horizontal wound in the mid tibia. Only the medial aspect of this is still open. Area on the lateral ankle is just about closed. She had a new pressure ulcer on the right heel which I have removed some of the eschar. We have been using Iodoflex which I will continue. The area in the mid tibia has a round circle in the middle of exposed muscle. I think we would have to use an advanced treatment product to stimulate granulation over this area. We will run this through her insurance. She is not eligible for plastic surgery for many reasons 3/1; TELEHEALTH. The patient was seen today by telehealth. 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 We have been using Iodoflex to the wound areas which include the right anterior mid tibia, right lateral ankle and the right heel. All of these were reviewed 3/8; the patient's wound just above the right ankle is closed. She  still has a contracting black eschar on the heel where she had a pressure ulcer. The medial part of her original wound on the mid tibia has exposed muscle. We have made really made no progress in this area although we have managed to get a lot of the original wound in this area to close. We used Apligraf #1 today 3/22; the patient's ankle wound remains closed. She still has a contracting black eschar on the heel although it seems to have less surface area. It still not clear whether there is any depth here. We used Apligraf #2 today in an attempt to get granulation over the exposed muscle and what is left of her mid tibia area. 4/5; everything is closed except the medial aspect of the mid tibia wound, as well as the pressure injury on the right heel. She has been using Betadine to the right heel which has been gradually contracting. We used Apligraf #3 today. 4/19; Apligraf #4. Still a pressure area on the right heel she has been using Betadine superficial excoriated areas she has been applying salicylic acid to on the anterior leg and the thick horn on the leg just above her wound area 5/3; Apligraf #5. Unfortunately she came in today with a reopening of the area on the right lateral lower leg. Not much change in the wound we have been treating in the mid calf. Quite a bit of edema surrounding this wound. She reports that she was up on her feet quite  a bit. The area on the right heel is separating she has been using Betadine She comes in with a new wound on the left lateral malleolus which is very disappointing this is been open since last week 5/17; we applied her last Apligraf last time. Unfortunately that did not have any effect on the deep area medially in the mid tibial area. However the area over the right lateral malleolus is a lot better. Right heel also is contracted we used Iodoflex last time. The new wound on the left lateral malleolus were using Santyl to. This required  debridement. 6/1. Not much change in the area on the right medial mid tibia. Right lateral ankle is improved she has the necrotic area on the right heel which was a pressure area. New area on the left lateral malleolus from last time. I changed her to Iodoflex to the area on the left lateral malleolus she said this hurt I have put her back on Santyl Objective Constitutional Patient is hypertensive.. Pulse regular and within target range for patient.Marland Kitchen Respirations regular, non-labored and within target range.. Temperature is normal and within the target range for the patient.Marland Kitchen Appears in no distress. Vitals Time Taken: 11:48 AM, Height: 68 in, Weight: 132 lbs, BMI: 20.1, Temperature: 98.2 F, Pulse: 68 bpm, Respiratory Rate: 18 breaths/min, Blood Pressure: 156/51 mmHg. General Notes: Wound exam ooMidportion of the right tibia medially. Absolutely no change in the still exposed muscle. ooReopening of the right lateral malleolus from last week seems better ooLeft lateral malleolus still in considerable amount of debris I did not debride this today mechanically. ooThe area on the right heel has a necrotic surface. This is in the process of loosening on its own. No debridement of this either Integumentary (Hair, Skin) Wound #16 status is Open. Original cause of wound was Pressure Injury. The wound is located on the Right Calcaneus. The wound measures 1cm length x 1.6cm width x 0.6cm depth; 1.257cm^2 area and 0.754cm^3 volume. There is Fat Layer (Subcutaneous Tissue) Exposed exposed. There is no tunneling or undermining noted. There is a small amount of serosanguineous drainage noted. The wound margin is distinct with the outline attached to the wound base. There is no granulation within the wound bed. There is a large (67-100%) amount of necrotic tissue within the wound bed including Eschar. Wound #19 status is Open. Original cause of wound was Gradually Appeared. The wound is located on the  Right,Lateral Malleolus. The wound measures 0.4cm length x 0.2cm width x 0.2cm depth; 0.063cm^2 area and 0.013cm^3 volume. There is Fat Layer (Subcutaneous Tissue) Exposed exposed. There is no tunneling or undermining noted. There is a small amount of serosanguineous drainage noted. The wound margin is distinct with the outline attached to the wound base. There is no granulation within the wound bed. There is a large (67-100%) amount of necrotic tissue within the wound bed including Adherent Slough. Wound #20 status is Open. Original cause of wound was Gradually Appeared. The wound is located on the Left,Lateral Malleolus. The wound measures 0.7cm length x 0.6cm width x 0.2cm depth; 0.33cm^2 area and 0.066cm^3 volume. There is Fat Layer (Subcutaneous Tissue) Exposed exposed. There is no tunneling or undermining noted. There is a medium amount of serous drainage noted. The wound margin is well defined and not attached to the wound base. There is no granulation within the wound bed. There is a large (67-100%) amount of necrotic tissue within the wound bed including Adherent Slough. Wound #5 status is Open. Original cause  of wound was Gradually Appeared. The wound is located on the Right,Medial Lower Leg. The wound measures 1.8cm length x 1.6cm width x 0.5cm depth; 2.262cm^2 area and 1.131cm^3 volume. There is Fat Layer (Subcutaneous Tissue) Exposed exposed. There is no tunneling or undermining noted. There is a medium amount of purulent drainage noted. The wound margin is flat and intact. There is small (1-33%) pink granulation within the wound bed. There is a large (67-100%) amount of necrotic tissue within the wound bed including Adherent Slough. 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 There was a Three Layer Compression Therapy Procedure by Carlene Coria, RN. Post procedure Diagnosis Wound #16: Same as Pre-Procedure Wound #19 Pre-procedure diagnosis of Wound #19 is a Venous Leg Ulcer located on the Right,Lateral Malleolus . There was a Three Layer Compression Therapy Procedure by Carlene Coria, RN. Post procedure Diagnosis Wound #19: Same as Pre-Procedure Wound #5 Pre-procedure diagnosis of Wound #5 is a Venous Leg Ulcer located on the Right,Medial Lower Leg . There was a Three Layer Compression Therapy Procedure by Carlene Coria, RN. Post procedure Diagnosis Wound #5: Same as Pre-Procedure Plan Follow-up Appointments: Return Appointment in 2 weeks. Nurse Visit: - 1 week for rewrap Dressing Change Frequency: Do not change entire dressing for one week. - right leg/heel Wound #20 Left,Lateral Malleolus: Change dressing every day. Skin Barriers/Peri-Wound Care: Moisturizing lotion - both legs Wound Cleansing: May shower with protection. Wound #20 Left,Lateral Malleolus: May shower and wash wound with soap and water. Primary Wound Dressing: Wound #16 Right Calcaneus: Iodoflex Wound #19 Right,Lateral Malleolus: Silver Collagen - moisten with hydrogel Wound #20 Left,Lateral Malleolus: Santyl Ointment Wound #5 Right,Medial Lower Leg: Silver Collagen - moisten with hydrogel Secondary Dressing: Wound #16 Right Calcaneus: Dry Gauze Heel Cup Wound #19 Right,Lateral Malleolus: Adaptic Dressing - secure with steri strips Dry Gauze Wound #20 Left,Lateral Malleolus: Foam Border Wound #5 Right,Medial Lower Leg: Adaptic Dressing -  secure with steri strips Dry Gauze Edema Control: 3 Layer Compression System - Right Lower Extremity Avoid standing for long periods of time Elevate legs to the level of the heart or above for 30 minutes daily and/or when sitting, a frequency of: - throughout the day Off-Loading: Turn and reposition every 2 hours Other: - float heels off of bed/chair with pillow under calves 1. Continue with silver collagen to the right tibia and right lateral malleolus 2. I changed from Iodoflex back to [facility] because of stinging discomfort. This is on the left lateral malleolus I do not think she has an arterial issue here 3. Still Iodoflex to the right heel this probably can be mechanically debrided in the next 2 weeks Electronic Signature(s) Signed: 05/31/2020 5:52:05 PM By: Linton Ham MD Entered By: Linton Ham on 05/31/2020 13:06:33 -------------------------------------------------------------------------------- SuperBill Details Patient Name: Date of Service: [name], Yolanda Manges 05/31/2020 Medical Record Number: [id number] Patient Account Number: [id number] Date of Birth/Sex: Treating RN: 04-13-1951 (69 y.o. 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 I87.331 Chronic venous hypertension (idiopathic) with ulcer and inflammation of right lower extremity L89.616 Pressure-induced deep tissue damage of right heel L97.318 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 Facility Procedures CPT4 Code: [id number] Description: (Facility Use Only)  [phone] - APPLY MULTLAY COMPRS LWR RT LEG Modifier: Quantity: 1 Physician Procedures : CPT4 Code Description Modifier [id number] - WC PHYS LEVEL 3 - EST PT ICD-10 Diagnosis Description Y58.592 Non-pressure chronic ulcer of right calf with necrosis of muscle L97.811 Non-pressure chronic ulcer of other part of right lower leg limited to  breakdown of skin L89.616 Pressure-induced deep tissue damage of right heel Quantity: 1 Electronic Signature(s) Signed: 05/31/2020 5:52:05 PM By: Linton Ham MD Signed: 06/01/2020 4:36:43 PM By: Carlene Coria RN Entered By: Carlene Coria on 05/31/2020 13:19:31

## 2020-06-02 ENCOUNTER — Other Ambulatory Visit: Payer: Self-pay | Admitting: *Deleted

## 2020-06-02 ENCOUNTER — Other Ambulatory Visit: Payer: Self-pay | Admitting: Internal Medicine

## 2020-06-02 ENCOUNTER — Other Ambulatory Visit (HOSPITAL_COMMUNITY): Payer: Self-pay | Admitting: Internal Medicine

## 2020-06-02 NOTE — Patient Outreach (Signed)
[facility]) Care Management  06/02/2020  [name] [name] 04-Nov-1951 [id number]  Telephone Assessment-RE: Call Back  RN spoke briefly with pt today and reintroduced [facility]. Pt requested a call back and unable to talk at this time.  Plan: Will attempt another outreach today or tomorrow for update on ongoing management of care. Raina Mina, RN Care Management Coordinator [place] Office [phone]

## 2020-06-02 NOTE — Patient Outreach (Addendum)
[facility]) Care Management  06/02/2020  [name] [name] May 02, 1951 [id number]   Telephone Assessment-Case closure  RN spoke with pt today and received an update on pt's ongoing management of care.Discussed the current plan of care with all goals met based upon the interventions discussed and ongoing education over the past several months. Pt reports she has completed all therapy with [facility] and continues to perform the exercises for strengthening and endurance.  Pt reports she is doing well with assistance from her spouse [name] [name]). Pt mentioned all upcoming appointments and denies any additional needs.  RN offered possible referral for ongoing disease management of care related to her HTN or HF however pt opt to decline indicated she is doing well again with no additional needs.   RN verified pt has [facility] contact number if needed in the future for any related services for RN/social worker or pharmacy needs. Pt verbalized an understanding. RN also alerted pt that her primary provider would be updated on her disposition with [facility] services at this time. No additional inquires or request at this time. Case will be closed.  [facility] CM Care Plan Problem One     Most Recent Value  Care Plan Problem One  Recent hospitalization related to Impaired safety-falls  Role Documenting the Problem One  Care Management Telephonic Coordinator  Care Plan for Problem One  Active  [facility] Long Term Goal   Pt will not sustain fall within the next 90 days.  [facility] Long Term Goal Start Date  12/03/19  [facility] Long Term Goal Met Date  06/02/20  [facility] CM Short Term Goal #1   Pt will work with the physical therapist on fall prevention methods over the next 30 days.  [facility] CM Short Term Goal #1 Start Date  12/03/19  [facility] CM Short Term Goal #1 Met Date  06/02/20      Raina Mina, RN Care Management Coordinator [place] Office [phone]

## 2020-06-03 DIAGNOSIS — M810 Age-related osteoporosis without current pathological fracture: Secondary | ICD-10-CM | POA: Diagnosis not present

## 2020-06-03 DIAGNOSIS — M85852 Other specified disorders of bone density and structure, left thigh: Secondary | ICD-10-CM | POA: Diagnosis not present

## 2020-06-03 LAB — HM DEXA SCAN

## 2020-06-06 ENCOUNTER — Encounter (HOSPITAL_BASED_OUTPATIENT_CLINIC_OR_DEPARTMENT_OTHER): Payer: Medicare Other | Admitting: Internal Medicine

## 2020-06-06 DIAGNOSIS — I87331 Chronic venous hypertension (idiopathic) with ulcer and inflammation of right lower extremity: Secondary | ICD-10-CM | POA: Diagnosis not present

## 2020-06-06 NOTE — Progress Notes (Signed)
JENI, DULING ([id number]) Visit Report for 05/31/2020 Arrival Information Details Patient Name: Date of Service: [name] 05/31/2020 11:00 A M Medical Record Number: [id number] Patient Account Number: [id number] Date of Birth/Sex: Treating RN: [birth year] (69 y.o. [name] Primary Care Murl Golladay: [name], [place] RY Other Clinician: Referring Pattrick Bady: Treating Juron Vorhees/Extender: [name], MA RY Weeks in Treatment: 614 Visit Information History Since Last Visit Added or deleted any medications: No Patient Arrived: Ambulatory Any new allergies or adverse reactions: No Arrival Time: 11:47 Had a fall or experienced change in No Accompanied By: husband activities of daily living that may affect Transfer Assistance: None risk of falls: Patient Identification Verified: Yes Signs or symptoms of abuse/neglect since last visito No Secondary Verification Process Completed: Yes Hospitalized since last visit: No Patient Requires Transmission-Based Precautions: No Implantable device outside of the clinic excluding No Patient Has Alerts: No cellular tissue based products placed in the center since last visit: Has Dressing in Place as Prescribed: Yes Has Compression in Place as Prescribed: Yes Pain Present Now: Yes Electronic Signature(s) Signed: 06/06/2020 6:28:13 PM By: Levan Hurst RN, BSN Entered By: Levan Hurst on 05/31/2020 11:48:45 -------------------------------------------------------------------------------- Compression Therapy Details Patient Name: Date of Service: [name] 05/31/2020 11:00 A M Medical Record Number: [id number] Patient Account Number: [id number] Date of Birth/Sex: Treating RN: 11-Oct-1951 (69 y.o. Orvan Falconer Primary Care Kuzey Ogata: [name], [place] R[place] Other Clinician: Referring Meriah Shands: Treating Lola Czerwonka/Extender: [name], MA RY Weeks in Treatment: 427 Compression Therapy Performed for Wound Assessment:  Wound #16 Right Calcaneus Performed By: Clinician Carlene Coria, RN Compression Type: Three Layer Post Procedure Diagnosis Same as Pre-procedure Electronic Signature(s) Signed: 06/01/2020 4:36:43 PM By: Carlene Coria RN Entered By: Carlene Coria on 05/31/2020 12:15:31 -------------------------------------------------------------------------------- Compression Therapy Details Patient Name: Date of Service: [name] 05/31/2020 11:00 A M Medical Record Number: [id number] Patient Account Number: [id number] Date of Birth/Sex: Treating RN: 30-Jul-1951 (69 y.o. Orvan Falconer Primary Care Kainan Patty: [name], [place] R[place] Other Clinician: Referring Pammie Chirino: Treating Marijah Larranaga/Extender: [name], MA RY Weeks in Treatment: 427 Compression Therapy Performed for Wound Assessment: Wound #19 Right,Lateral Malleolus Performed By: Jake Church, RN Compression Type: Three Layer Post Procedure Diagnosis Same as Pre-procedure Electronic Signature(s) Signed: 06/01/2020 4:36:43 PM By: Carlene Coria RN Entered By: Carlene Coria on 05/31/2020 12:15:31 -------------------------------------------------------------------------------- Compression Therapy Details Patient Name: Date of Service: [name] 05/31/2020 11:00 A M Medical Record Number: [id number] Patient Account Number: [id number] Date of Birth/Sex: Treating RN: 1951-09-29 (69 y.o. Orvan Falconer Primary Care Azarria Balint: [name], [place] R[place] Other Clinician: Referring Saralynn Langhorst: Treating Julias Mould/Extender: [name], MA RY Weeks in Treatment: 427 Compression Therapy Performed for Wound Assessment: Wound #5 Right,Medial Lower Leg Performed By: Clinician Carlene Coria, RN Compression Type: Three Layer Post Procedure Diagnosis Same as Pre-procedure Electronic Signature(s) Signed: 06/01/2020 4:36:43 PM By: Carlene Coria RN Entered By: Carlene Coria on 05/31/2020  12:15:31 -------------------------------------------------------------------------------- Encounter Discharge Information Details Patient Name: Date of Service: [name], [name] 05/31/2020 11:00 A M Medical Record Number: [id number] Patient Account Number: [id number] Date of Birth/Sex: Treating RN: 1951/03/26 (69 y.o. [name] Primary Care Kadajah Kjos: [name], [place] R[place] Other Clinician: Referring Cliffton Spradley: Treating Lauri Till/Extender: [name], MA RY Weeks in Treatment: [phone] Encounter Discharge Information Items Discharge Condition: Stable Ambulatory Status: Ambulatory Discharge Destination: Home Transportation:  Private Auto Accompanied By: husband Schedule Follow-up Appointment: Yes Clinical Summary of Care: Patient Declined Electronic Signature(s) Signed: 06/01/2020 7:13:26 AM By: Kela Millin Entered By: Kela Millin on 05/31/2020 13:11:07 -------------------------------------------------------------------------------- Lower Extremity Assessment Details Patient Name: Date of Service: [name] 05/31/2020 11:00 A M Medical Record Number: [id number] Patient Account Number: [id number] Date of Birth/Sex: Treating RN: 1951-07-26 (69 y.o. [name] Primary Care Sherrilyn Nairn: [name], [place] R[place] Other Clinician: Referring Jolayne Branson: Treating Bartow Zylstra/Extender: [name], MA RY Weeks in Treatment: 427 Edema Assessment Assessed: [Left: No] [Right: No] Edema: [Left: Yes] [Right: Yes] Calf Left: Right: Point of Measurement: 36 cm From Medial Instep 32.3 cm 38 cm Ankle Left: Right: Point of Measurement: 10 cm From Medial Instep 22 cm 22 cm Vascular Assessment Pulses: Dorsalis Pedis Palpable: [Left:No] [Right:No] Electronic Signature(s) Signed: 05/31/2020 5:54:40 PM By: Baruch Gouty RN, BSN Entered By: Baruch Gouty on 05/31/2020  12:06:09 -------------------------------------------------------------------------------- Multi Wound Chart Details Patient Name: Date of Service: [name] 05/31/2020 11:00 A M Medical Record Number: [id number] Patient Account Number: [id number] Date of Birth/Sex: Treating RN: 1951-07-05 (69 y.o. 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] Primary Etiology: [16:Anemia, Hypertension, Peripheral] [19:Anemia, Hypertension, Peripheral] [20:Anemia, Hypertension, Peripheral] Comorbid History: [16:Arterial Disease, Peripheral Venous Disease, Raynauds, Scleroderma, Rheumatoid Arthritis, Osteoarthritis 01/04/2020] [19:Arterial Disease, Peripheral Venous Disease, Raynauds, Scleroderma, Rheumatoid Arthritis, Osteoarthritis  05/02/2020] [20:Arterial Disease, Peripheral Venous Disease, Raynauds, Scleroderma, Rheumatoid Arthritis, Osteoarthritis 05/02/2020] Date Acquired: [16:21] [19:4] [20:4] Weeks of Treatment: [16:Open] [19:Open] [20:Open] Wound Status: [16:No] [19:No] [20:No] Clustered Wound: [16:1x1.6x0.6] [19:0.4x0.2x0.2] [20:0.7x0.6x0.2] Measurements L x W x D (cm) [16:1.257] [19:0.063] [20:0.33] A (cm) : rea [16:0.754] [19:0.013] [20:0.066] Volume (cm) : [16:90.00%] [19:55.30%] [20:25.00%] % Reduction in Area: [16:40.00%] [19:53.60%] [20:25.00%] % Reduction in Volume:  [16:Unstageable/Unclassified] [19:Full Thickness Without Exposed] [20:Unclassifiable] Classification: [16:Small] [19:Support Structures Small] [20:Medium] Exudate A mount: [16:Serosanguineous] [19:Serosanguineous] [20:Serous] Exudate Type: [16:red, brown] [19:red, brown] [20:amber] Exudate Color: [16:Distinct, outline attached] [19:Distinct, outline attached] [20:Well defined, not attached] Wound Margin: [16:None Present (0%)] [19:None Present (0%)] [20:None Present (0%)] Granulation Amount: [16:N/A] [19:N/A] [20:N/A] Granulation Quality: [16:Large (67-100%)] [19:Large (67-100%)] [20:Large (67-100%)] Necrotic Amount: [16:Eschar] [19:Adherent Slough] [20:Adherent Slough] Necrotic Tissue: [16:Fat Layer (Subcutaneous Tissue)] [19:Fat Layer (Subcutaneous Tissue)] [20:Fat Layer (Subcutaneous Tissue)] Exposed Structures: [16:Exposed: Yes Fascia: No Tendon: No Muscle: No Joint: No Bone: No Small (1-33%)] [19:Exposed: Yes Fascia: No Tendon: No Muscle: No Joint: No Bone: No Small (1-33%)] [20:Exposed: Yes Fascia: No Tendon: No Muscle: No Joint: No Bone: No None] Epithelialization: [16:Compression Therapy] [19:Compression Therapy] [20:N/A] Wound Number: 5 N/A N/A Photos: No Photos N/A N/A Right, Medial Lower Leg N/A N/A Wound Location: Gradually Appeared N/A N/A Wounding Event: Venous Leg Ulcer N/A N/A Primary Etiology: Anemia, Hypertension, Peripheral N/A N/A Comorbid History: Arterial Disease, Peripheral Venous Disease, Raynauds, Scleroderma, Rheumatoid Arthritis, Osteoarthritis 01/13/2013 N/A N/A Date Acquired: 383 N/A N/A Weeks of Treatment: Open N/A N/A Wound Status: Yes N/A N/A Clustered Wound: 1.8x1.6x0.5 N/A N/A Measurements L x W x D (cm) 2.262 N/A N/A A (cm) : rea 1.131  N/A N/A Volume (cm) : -60.00% N/A N/A % Reduction in Area: -702.10% N/A N/A % Reduction in Volume: Full Thickness With Exposed Support N/A N/A Classification: Structures Medium N/A N/A Exudate A  mount: Purulent N/A N/A Exudate Type: yellow, brown, green N/A N/A Exudate Color: Flat and Intact N/A N/A Wound Margin: Small (1-33%) N/A N/A Granulation Amount: Pink N/A N/A Granulation Quality: Large (67-100%) N/A N/A Necrotic Amount: Adherent Slough N/A N/A Necrotic Tissue: Fat Layer (Subcutaneous Tissue) N/A N/A Exposed Structures: Exposed: Yes Fascia: No Tendon: No Muscle: No Joint: No Bone: No Small (1-33%) N/A N/A Epithelialization: Compression Therapy N/A N/A Procedures Performed: Treatment Notes Electronic Signature(s) Signed: 05/31/2020 5:52:05 PM By: Linton Ham MD Signed: 06/01/2020 4:36:43 PM By: Carlene Coria RN Signed: 06/01/2020 4:36:43 PM By: Carlene Coria RN Entered By: Linton Ham on 05/31/2020 13:02:45 -------------------------------------------------------------------------------- Multi-Disciplinary Care Plan Details Patient Name: Date of Service: [name], [name] 05/31/2020 11:00 A M Medical Record Number: [id number] Patient Account Number: [id number] Date of Birth/Sex: Treating RN: [birth year] (69 y.o. Orvan Falconer Primary Care Aaralyn Kil: [name], [place] R[place] Other Clinician: Referring Lesly Pontarelli: Treating Charelle Petrakis/Extender: [name], MA RY Weeks in Treatment: [phone] Active Inactive Wound/Skin Impairment Nursing Diagnoses: Impaired tissue integrity Knowledge deficit related to ulceration/compromised skin integrity Goals: Patient/caregiver will verbalize understanding of skin care regimen Date Initiated: 10/11/2016 Target Resolution Date: 06/03/2020 Goal Status: Active Interventions: Assess patient/caregiver ability to obtain necessary supplies Assess patient/caregiver ability to perform ulcer/skin care regimen upon admission and as needed Assess ulceration(s) every visit Provide education on ulcer and skin care Treatment Activities: Skin care regimen initiated : 10/11/2016 Topical wound management initiated :  10/11/2016 Notes: Electronic Signature(s) Signed: 06/01/2020 4:36:43 PM By: Carlene Coria RN Entered By: Carlene Coria on 05/31/2020 12:12:10 -------------------------------------------------------------------------------- Pain Assessment Details Patient Name: Date of Service: [name] Montey Hora F. 05/31/2020 11:00 A M Medical Record Number: [id number] Patient Account Number: [id number] Date of Birth/Sex: Treating RN: December 23, 1951 (69 y.o. [name] Primary Care Latorya Bautch: [name], [place] R[place] Other Clinician: Referring Chyane Greer: Treating Trixie Maclaren/Extender: [name], MA RY Weeks in Treatment: [phone] Active Problems Location of Pain Severity and Description of Pain Patient Has Paino Yes Site Locations Pain Location: Pain Location: Pain in Ulcers With Dressing Change: Yes Duration of the Pain. Constant / Intermittento Intermittent Rate the pain. Current Pain Level: 4 Character of Pain Describe the Pain: Burning Pain Management and Medication Current Pain Management: Medication: Yes Cold Application: No Rest: No Massage: No Activity: No T.E.N.S.: No Heat Application: No Leg drop or elevation: No Is the Current Pain Management Adequate: Inadequate Electronic Signature(s) Signed: 06/06/2020 6:28:13 PM By: Levan Hurst RN, BSN Entered By: Levan Hurst on 05/31/2020 11:49:31 -------------------------------------------------------------------------------- Patient/Caregiver Education Details Patient Name: Date of Service: [name], Yolanda Manges 6/1/2021andnbsp11:00 A M Medical Record Number: [id number] Patient Account Number: [id number] Date of Birth/Gender: Treating RN: 11/17/1951 (69 y.o. Orvan Falconer Primary Care Physician: [name], [place] R[place] Other Clinician: Referring Physician: Treating Physician/Extender: [name], MA RY Weeks in Treatment: [phone] Education Assessment Education Provided To: Patient Education Topics Provided Wound/Skin  Impairment: Methods: Explain/Verbal Responses: State content correctly Electronic Signature(s) Signed: 06/01/2020 4:36:43 PM By: Carlene Coria RN Entered By: Carlene Coria on 05/31/2020 12:12:28 -------------------------------------------------------------------------------- Wound Assessment Details Patient Name: Date of Service: [name], [name] 05/31/2020 11:00 A M Medical Record Number: [id number] Patient Account Number: [id number] Date of Birth/Sex: Treating RN: 01/15/1951 (  69 y.o. [name] Primary Care Laszlo Ellerby: [name], [place] R[place] Other Clinician: Referring Nonnie Pickney: Treating Ming Kunka/Extender: [name], MA RY Weeks in Treatment: 427 Wound Status Wound Number: 16 Primary Pressure Ulcer Etiology: Wound Location: Right Calcaneus Wound Open Wounding Event: Pressure Injury Status: Date Acquired: 01/04/2020 Comorbid Anemia, Hypertension, Peripheral Arterial Disease, Peripheral Weeks Of Treatment: 21 History: Venous Disease, Raynauds, Scleroderma, Rheumatoid Arthritis, Clustered Wound: No Osteoarthritis Photos Photo Uploaded By: Mikeal Hawthorne on 06/01/2020 08:40:59 Wound Measurements Length: (cm) 1 Width: (cm) 1.6 Depth: (cm) 0.6 Area: (cm) 1.257 Volume: (cm) 0.754 % Reduction in Area: 90% % Reduction in Volume: 40% Epithelialization: Small (1-33%) Tunneling: No Undermining: No Wound Description Classification: Unstageable/Unclassified Wound Margin: Distinct, outline attached Exudate Amount: Small Exudate Type: Serosanguineous Exudate Color: red, brown Foul Odor After Cleansing: No Slough/Fibrino Yes Wound Bed Granulation Amount: None Present (0%) Exposed Structure Necrotic Amount: Large (67-100%) Fascia Exposed: No Necrotic Quality: Eschar Fat Layer (Subcutaneous Tissue) Exposed: Yes Tendon Exposed: No Muscle Exposed: No Joint Exposed: No Bone Exposed: No Treatment Notes Wound #16 (Right Calcaneus) 1. Cleanse With Wound  Cleanser Soap and water 2. Periwound Care Moisturizing lotion 3. Primary Dressing Applied Collegen AG Iodoflex 4. Secondary Dressing Dry Gauze 6. Support Layer Applied 3 layer compression wrap Notes iodoflex to heel Electronic Signature(s) Signed: 05/31/2020 5:54:40 PM By: Baruch Gouty RN, BSN Entered By: Baruch Gouty on 05/31/2020 11:59:25 -------------------------------------------------------------------------------- Wound Assessment Details Patient Name: Date of Service: [name], [name] 05/31/2020 11:00 A M Medical Record Number: [id number] Patient Account Number: [id number] Date of Birth/Sex: Treating RN: 04-02-1951 (69 y.o. [name] Primary Care Mildred Tuccillo: [name], [place] R[place] Other Clinician: Referring Kohan Azizi: Treating Liesl Simons/Extender: [name], MA RY Weeks in Treatment: 427 Wound Status Wound Number: 19 Primary Venous Leg Ulcer Etiology: Wound Location: Right, Lateral Malleolus Wound Open Wounding Event: Gradually Appeared Status: Date Acquired: 05/02/2020 Comorbid Anemia, Hypertension, Peripheral Arterial Disease, Peripheral Weeks Of Treatment: 4 History: Venous Disease, Raynauds, Scleroderma, Rheumatoid Arthritis, Clustered Wound: No Osteoarthritis Photos Photo Uploaded By: Mikeal Hawthorne on 06/01/2020 08:40:59 Wound Measurements Length: (cm) 0.4 Width: (cm) 0.2 Depth: (cm) 0.2 Area: (cm) 0.063 Volume: (cm) 0.013 % Reduction in Area: 55.3% % Reduction in Volume: 53.6% Epithelialization: Small (1-33%) Tunneling: No Undermining: No Wound Description Classification: Full Thickness Without Exposed Support Structures Wound Margin: Distinct, outline attached Exudate Amount: Small Exudate Type: Serosanguineous Exudate Color: red, brown Foul Odor After Cleansing: No Slough/Fibrino Yes Wound Bed Granulation Amount: None Present (0%) Exposed Structure Necrotic Amount: Large (67-100%) Fascia Exposed: No Necrotic Quality:  Adherent Slough Fat Layer (Subcutaneous Tissue) Exposed: Yes Tendon Exposed: No Muscle Exposed: No Joint Exposed: No Bone Exposed: No Treatment Notes Wound #19 (Right, Lateral Malleolus) 1. Cleanse With Wound Cleanser Soap and water 2. Periwound Care Moisturizing lotion 3. Primary Dressing Applied Collegen AG Iodoflex 4. Secondary Dressing Dry Gauze 6. Support Layer Applied 3 layer compression wrap Notes iodoflex to heel Electronic Signature(s) Signed: 05/31/2020 5:54:40 PM By: Baruch Gouty RN, BSN Entered By: Baruch Gouty on 05/31/2020 12:00:09 -------------------------------------------------------------------------------- Wound Assessment Details Patient Name: Date of Service: [name], [name] 05/31/2020 11:00 A M Medical Record Number: [id number] Patient Account Number: [id number] Date of Birth/Sex: Treating RN: August 26, 1951 (69 y.o. [name] Primary Care Kashae Carstens: [name], [place] Other Clinician: Referring Lajuan Kovaleski: Treating Maryfrances Portugal/Extender: [name], MA RY Weeks in Treatment: 427 Wound Status Wound Number: 20 Primary Arterial Insufficiency Ulcer Etiology: Wound Location: Left,  Lateral Malleolus Wound Open Wounding Event: Gradually Appeared Status: Date Acquired: 05/02/2020 Comorbid Anemia, Hypertension, Peripheral Arterial Disease, Peripheral Weeks Of Treatment: 4 History: Venous Disease, Raynauds, Scleroderma, Rheumatoid Arthritis, Clustered Wound: No Osteoarthritis Photos Wound Measurements Length: (cm) 0.7 Width: (cm) 0.6 Depth: (cm) 0.2 Area: (cm) 0.33 Volume: (cm) 0.066 % Reduction in Area: 25% % Reduction in Volume: 25% Epithelialization: None Tunneling: No Undermining: No Wound Description Classification: Unclassifiable Wound Margin: Well defined, not attached Exudate Amount: Medium Exudate Type: Serous Exudate Color: amber Foul Odor After Cleansing: No Slough/Fibrino Yes Wound Bed Granulation Amount: None  Present (0%) Exposed Structure Necrotic Amount: Large (67-100%) Fascia Exposed: No Necrotic Quality: Adherent Slough Fat Layer (Subcutaneous Tissue) Exposed: Yes Tendon Exposed: No Muscle Exposed: No Joint Exposed: No Bone Exposed: No Treatment Notes Wound #20 (Left, Lateral Malleolus) 1. Cleanse With Wound Cleanser 2. Periwound Care Skin Prep 3. Primary Dressing Applied Santyl 4. Secondary Dressing Foam Border Dressing Electronic Signature(s) Signed: 06/01/2020 4:21:49 PM By: Mikeal Hawthorne EMT/HBOT Signed: 06/01/2020 5:52:33 PM By: Baruch Gouty RN, BSN Previous Signature: 05/31/2020 5:54:40 PM Version By: Baruch Gouty RN, BSN Entered By: Mikeal Hawthorne on 06/01/2020 08:37:59 -------------------------------------------------------------------------------- Wound Assessment Details Patient Name: Date of Service: [name], [name] 05/31/2020 11:00 A M Medical Record Number: [id number] Patient Account Number: [id number] Date of Birth/Sex: Treating RN: 1951-01-09 (69 y.o. [name] Primary Care Grizelda Piscopo: [name], [place] R[place] Other Clinician: Referring Shreyan Hinz: Treating Martinez Boxx/Extender: [name], MA RY Weeks in Treatment: 427 Wound Status Wound Number: 5 Primary Venous Leg Ulcer Etiology: Wound Location: Right, Medial Lower Leg Wound Open Wounding Event: Gradually Appeared Status: Date Acquired: 01/13/2013 Comorbid Anemia, Hypertension, Peripheral Arterial Disease, Peripheral Weeks Of Treatment: 383 History: Venous Disease, Raynauds, Scleroderma, Rheumatoid Arthritis, Clustered Wound: Yes Osteoarthritis Photos Photo Uploaded By: Mikeal Hawthorne on 06/01/2020 08:35:43 Wound Measurements Length: (cm) 1.8 Width: (cm) 1.6 Depth: (cm) 0.5 Area: (cm) 2.262 Volume: (cm) 1.131 % Reduction in Area: -60% % Reduction in Volume: -702.1% Epithelialization: Small (1-33%) Tunneling: No Undermining: No Wound Description Classification: Full Thickness  With Exposed Support Structures Wound Margin: Flat and Intact Exudate Amount: Medium Exudate Type: Purulent Exudate Color: yellow, brown, green Foul Odor After Cleansing: No Slough/Fibrino Yes Wound Bed Granulation Amount: Small (1-33%) Exposed Structure Granulation Quality: Pink Fascia Exposed: No Necrotic Amount: Large (67-100%) Fat Layer (Subcutaneous Tissue) Exposed: Yes Necrotic Quality: Adherent Slough Tendon Exposed: No Muscle Exposed: No Joint Exposed: No Bone Exposed: No Treatment Notes Wound #5 (Right, Medial Lower Leg) 1. Cleanse With Wound Cleanser Soap and water 2. Periwound Care Moisturizing lotion 3. Primary Dressing Applied Collegen AG Iodoflex 4. Secondary Dressing Dry Gauze 6. Support Layer Applied 3 layer compression wrap Notes iodoflex to heel Electronic Signature(s) Signed: 05/31/2020 5:54:40 PM By: Baruch Gouty RN, BSN Entered By: Baruch Gouty on 05/31/2020 12:04:27 -------------------------------------------------------------------------------- Vitals Details Patient Name: Date of Service: [name], [name] 05/31/2020 11:00 A M Medical Record Number: [id number] Patient Account Number: [id number] Date of Birth/Sex: Treating RN: 05/14/1951 (69 y.o. [name] Primary Care Naidelin Gugliotta: [name], [place] R[place] Other Clinician: Referring Dinna Severs: Treating Makila Colombe/Extender: [name], MA RY Weeks in Treatment: 427 Vital Signs Time Taken: 11:48 Temperature (F): 98.2 Height (in): 68 Pulse (bpm): 68 Weight (lbs): 132 Respiratory Rate (breaths/min): 18 Body Mass Index (BMI): 20.1 Blood Pressure (mmHg): 156/51 Reference Range: 80 - 120 mg / dl Electronic Signature(s) Signed: 06/06/2020 6:28:13 PM By: Levan Hurst RN, BSN Entered By: Donnal Debar,  [place]hat[place] on 05/31/2020 11:49:05

## 2020-06-07 ENCOUNTER — Encounter: Payer: Self-pay | Admitting: Internal Medicine

## 2020-06-07 NOTE — Progress Notes (Signed)
[name] [name], [name] [name] ([id number]) Visit Report for 06/06/2020 Arrival Information Details Patient Name: Date of Service: [name] 06/06/2020 11:15 A M Medical Record Number: [id number] Patient Account Number: [id number] Date of Birth/Sex: Treating RN: Aug 29, 1951 (69 y.o. [name] Primary Care Early Ord: [name], [place] RY Other Clinician: Referring Tarrance Januszewski: Treating Woodford Strege/Extender: [name], MA RY Weeks in Treatment: 46 Visit Information History Since Last Visit Added or deleted any medications: No Patient Arrived: Ambulatory Any new allergies or adverse reactions: No Arrival Time: 11:32 Had a fall or experienced change in No Accompanied By: husband activities of daily living that may affect Transfer Assistance: None risk of falls: Patient Identification Verified: Yes Signs or symptoms of abuse/neglect since last visito No Secondary Verification Process Completed: Yes Hospitalized since last visit: No Patient Requires Transmission-Based Precautions: No Implantable device outside of the clinic excluding No Patient Has Alerts: No cellular tissue based products placed in the center since last visit: Has Dressing in Place as Prescribed: Yes Pain Present Now: No Electronic Signature(s) Signed: 06/06/2020 1:20:36 PM By: Sandre Kitty Entered By: Sandre Kitty on 06/06/2020 11:33:05 -------------------------------------------------------------------------------- Compression Therapy Details Patient Name: Date of Service: [name] 06/06/2020 11:15 A M Medical Record Number: [id number] Patient Account Number: [id number] Date of Birth/Sex: Treating RN: March 14, 1951 (69 y.o. Orvan Falconer Primary Care [place]: [name], [place] RY Other Clinician: Referring Gerrod Maule: Treating Willet Schleifer/Extender: [name], MA RY Weeks in Treatment: 428 Compression Therapy Performed for Wound Assessment: Wound #16 Right Calcaneus Performed By:  Clinician Carlene Coria, RN Compression Type: Three Layer Electronic Signature(s) Signed: 06/07/2020 4:34:33 PM By: Carlene Coria RN Entered By: Carlene Coria on 06/06/2020 12:03:02 -------------------------------------------------------------------------------- Compression Therapy Details Patient Name: Date of Service: [name] 06/06/2020 11:15 A M Medical Record Number: [id number] Patient Account Number: [id number] Date of Birth/Sex: Treating RN: 09-09-1951 (69 y.o. Orvan Falconer Primary Care Zury Fazzino: Other Clinician: [name], [place] RY Referring Miriana Gaertner: Treating Sanai Frick/Extender: [name], MA RY Weeks in Treatment: 428 Compression Therapy Performed for Wound Assessment: Wound #19 Right,Lateral Malleolus Performed By: Jake Church, RN Compression Type: Three Layer Electronic Signature(s) Signed: 06/07/2020 4:34:33 PM By: Carlene Coria RN Entered By: Carlene Coria on 06/06/2020 12:03:02 -------------------------------------------------------------------------------- Compression Therapy Details Patient Name: Date of Service: [name] 06/06/2020 11:15 A M Medical Record Number: [id number] Patient Account Number: [id number] Date of Birth/Sex: Treating RN: 29-Aug-1951 (69 y.o. Orvan Falconer Primary Care Ryver Poblete: [name], [place] RY Other Clinician: Referring Edrick Whitehorn: Treating Tahji /Extender: [name], MA RY Weeks in Treatment: 428 Compression Therapy Performed for Wound Assessment: Wound #5 Right,Medial Lower Leg Performed By: Clinician Carlene Coria, RN Compression Type: Three Layer Electronic Signature(s) Signed: 06/07/2020 4:34:33 PM By: Carlene Coria RN Entered By: Carlene Coria on 06/06/2020 12:03:02 -------------------------------------------------------------------------------- Encounter Discharge Information Details Patient Name: Date of Service: [name] [name], [name] 06/06/2020 11:15 A M Medical Record Number:  [id number] Patient Account Number: [id number] Date of Birth/Sex: Treating RN: Dec 08, 1951 (69 y.o. Orvan Falconer Primary Care Desi Carby: [name], [place] RY Other Clinician: Referring Marine Lezotte: Treating Tishina Lown/Extender: [name], MA RY Weeks in Treatment: [phone] Encounter Discharge Information Items Discharge Condition: Stable Ambulatory Status: Ambulatory Discharge Destination: Home Transportation: Private Auto Accompanied By: husband Schedule Follow-up Appointment: Yes Clinical Summary of Care: Electronic Signature(s) Signed: 06/07/2020 4:34:33 PM By: Carlene Coria RN Entered By: Carlene Coria  on 06/06/2020 12:06:05 -------------------------------------------------------------------------------- Patient/Caregiver Education Details Patient Name: Date of Service: [name] 6/7/2021andnbsp11:15 A M Medical Record Number: [id number] Patient Account Number: [id number] Date of Birth/Gender: Treating RN: 1951-11-21 (69 y.o. Orvan Falconer Primary Care Physician: [name], [place] RY Other Clinician: Referring Physician: Treating Physician/Extender: [name], MA RY Weeks in Treatment: [phone] Education Assessment Education Provided To: Patient Education Topics Provided Wound/Skin Impairment: Methods: Explain/Verbal Responses: State content correctly Electronic Signature(s) Signed: 06/07/2020 4:34:33 PM By: Carlene Coria RN Entered By: Carlene Coria on 06/06/2020 12:05:47 -------------------------------------------------------------------------------- Wound Assessment Details Patient Name: Date of Service: [name] 06/06/2020 11:15 A M Medical Record Number: [id number] Patient Account Number: [id number] Date of Birth/Sex: Treating RN: [birth year] (69 y.o. [name] Primary Care Gabryel Talamo: [name], [place] RY Other Clinician: Referring Noha Karasik: Treating Alysa Duca/Extender: [name], MA RY Weeks in Treatment: 428 Wound  Status Wound Number: 16 Primary Etiology: Pressure Ulcer Wound Location: Right Calcaneus Wound Status: Open Wounding Event: Pressure Injury Date Acquired: 01/04/2020 Weeks Of Treatment: 22 Clustered Wound: No Wound Measurements Length: (cm) 1 Width: (cm) 1.6 Depth: (cm) 0.6 Area: (cm) 1.257 Volume: (cm) 0.754 % Reduction in Area: 90% % Reduction in Volume: 40% Wound Description Classification: Unstageable/Unclassified Treatment Notes Wound #16 (Right Calcaneus) 1. Cleanse With Wound Cleanser Soap and water 3. Primary Dressing Applied Iodoflex 4. Secondary Dressing Dry Gauze 6. Support Layer Applied 3 layer compression Water quality scientist) Signed: 06/06/2020 1:20:36 PM By: Sandre Kitty Signed: 06/06/2020 6:28:13 PM By: Levan Hurst RN, BSN Entered By: Sandre Kitty on 06/06/2020 11:33:50 -------------------------------------------------------------------------------- Wound Assessment Details Patient Name: Date of Service: [name] [name], [name] 06/06/2020 11:15 A M Medical Record Number: [id number] Patient Account Number: [id number] Date of Birth/Sex: Treating RN: [birth year] (69 y.o. [name] Primary Care Subhan Hoopes: [name], [place] RY Other Clinician: Referring Dennisse Swader: Treating Nusrat Encarnacion/Extender: [name], MA RY Weeks in Treatment: 428 Wound Status Wound Number: 19 Primary Etiology: Venous Leg Ulcer Wound Location: Right, Lateral Malleolus Wound Status: Open Wounding Event: Gradually Appeared Date Acquired: 05/02/2020 Weeks Of Treatment: 5 Clustered Wound: No Wound Measurements Length: (cm) 0.4 Width: (cm) 0.2 Depth: (cm) 0.2 Area: (cm) 0.063 Volume: (cm) 0.013 % Reduction in Area: 55.3% % Reduction in Volume: 53.6% Wound Description Classification: Full Thickness Without Exposed Support Structur es Treatment Notes Wound #19 (Right, Lateral Malleolus) 1. Cleanse With Wound Cleanser Soap and water 3. Primary Dressing  Applied Collegen AG Hydrogel or K-Y Jelly 4. Secondary Dressing Dry Gauze 6. Support Layer Applied 3 layer compression Water quality scientist) Signed: 06/06/2020 1:20:36 PM By: Sandre Kitty Signed: 06/06/2020 6:28:13 PM By: Levan Hurst RN, BSN Entered By: Sandre Kitty on 06/06/2020 11:33:50 -------------------------------------------------------------------------------- Wound Assessment Details Patient Name: Date of Service: [name] [name], [name] 06/06/2020 11:15 A M Medical Record Number: [id number] Patient Account Number: [id number] Date of Birth/Sex: Treating RN: November 25, 1951 (69 y.o. [name] Primary Care Zadin Lange: [name], [place] RY Other Clinician: Referring Sheela Mcculley: Treating Caterin Tabares/Extender: [name], MA RY Weeks in Treatment: 428 Wound Status Wound Number: 20 Primary Etiology: Arterial Insufficiency Ulcer Wound Location: Left, Lateral Malleolus Wound Status: Open Wounding Event: Gradually Appeared Date Acquired: 05/02/2020 Weeks Of Treatment: 5 Clustered Wound: No Wound Measurements Length: (cm) 0.7 Width: (cm) 0.6 Depth: (cm) 0.2 Area: (cm) 0.33 Volume: (cm) 0.066 % Reduction in Area: 25% % Reduction in Volume: 25% Wound Description Classification: Unclassifiable Electronic  Signature(s) Signed: 06/06/2020 1:20:36 PM By: Sandre Kitty Signed: 06/06/2020 6:28:13 PM By: Levan Hurst RN, BSN Entered By: Sandre Kitty on 06/06/2020 11:33:51 -------------------------------------------------------------------------------- Wound Assessment Details Patient Name: Date of Service: [name] [name], [name] 06/06/2020 11:15 A M Medical Record Number: [id number] Patient Account Number: [id number] Date of Birth/Sex: Treating RN: April 12, 1951 (69 y.o. [name] Primary Care Karigan Cloninger: [name], [place] RY Other Clinician: Referring [place] Sloop: Treating Nicholl Onstott/Extender: [name], MA RY Weeks in Treatment: 428 Wound  Status Wound Number: 5 Primary Etiology: Venous Leg Ulcer Wound Location: Right, Medial Lower Leg Wound Status: Open Wounding Event: Gradually Appeared Date Acquired: 01/13/2013 Weeks Of Treatment: 384 Clustered Wound: Yes Wound Measurements Length: (cm) 1.8 Width: (cm) 1.6 Depth: (cm) 0.5 Area: (cm) 2.262 Volume: (cm) 1.131 % Reduction in Area: -60% % Reduction in Volume: -702.1% Wound Description Classification: Full Thickness With Exposed Support Structures Treatment Notes Wound #5 (Right, Medial Lower Leg) 1. Cleanse With Wound Cleanser Soap and water 3. Primary Dressing Applied Collegen AG Hydrogel or K-Y Jelly 4. Secondary Dressing Dry Gauze 6. Support Layer Applied 3 layer compression Water quality scientist) Signed: 06/06/2020 1:20:36 PM By: Sandre Kitty Signed: 06/06/2020 6:28:13 PM By: Levan Hurst RN, BSN Entered By: Sandre Kitty on 06/06/2020 11:33:51 -------------------------------------------------------------------------------- Vitals Details Patient Name: Date of Service: [name] [name], [name] 06/06/2020 11:15 A M Medical Record Number: [id number] Patient Account Number: [id number] Date of Birth/Sex: Treating RN: 1951/05/09 (69 y.o. [name] Primary Care William Schake: [name], [place] RY Other Clinician: Referring Sheronda Parran: Treating Riddhi Grether/Extender: [name], MA RY Weeks in Treatment: 428 Vital Signs Time Taken: 11:33 Temperature (F): 98.3 Height (in): 68 Pulse (bpm): 70 Weight (lbs): 132 Respiratory Rate (breaths/min): 18 Body Mass Index (BMI): 20.1 Blood Pressure (mmHg): 161/66 Reference Range: 80 - 120 mg / dl Electronic Signature(s) Signed: 06/06/2020 1:20:36 PM By: Sandre Kitty Entered By: Sandre Kitty on 06/06/2020 11:33:22

## 2020-06-07 NOTE — Progress Notes (Signed)
JADDA, HUNSUCKER ([id number]) Visit Report for 06/06/2020 SuperBill Details Patient Name: Date of Service: [name] [name] Curling 06/06/2020 Medical Record Number: [id number] Patient Account Number: [id number] Date of Birth/Sex: Treating RN: 08/07/1951 (69 y.o. 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

## 2020-06-13 ENCOUNTER — Encounter: Payer: Self-pay | Admitting: Internal Medicine

## 2020-06-14 ENCOUNTER — Other Ambulatory Visit: Payer: Self-pay

## 2020-06-14 ENCOUNTER — Encounter (HOSPITAL_BASED_OUTPATIENT_CLINIC_OR_DEPARTMENT_OTHER): Payer: Medicare Other | Admitting: Internal Medicine

## 2020-06-14 DIAGNOSIS — I87331 Chronic venous hypertension (idiopathic) with ulcer and inflammation of right lower extremity: Secondary | ICD-10-CM | POA: Diagnosis not present

## 2020-06-14 DIAGNOSIS — L89610 Pressure ulcer of right heel, unstageable: Secondary | ICD-10-CM | POA: Diagnosis not present

## 2020-06-15 DIAGNOSIS — S3282XD Multiple fractures of pelvis without disruption of pelvic ring, subsequent encounter for fracture with routine healing: Secondary | ICD-10-CM | POA: Diagnosis not present

## 2020-06-15 DIAGNOSIS — S52502D Unspecified fracture of the lower end of left radius, subsequent encounter for closed fracture with routine healing: Secondary | ICD-10-CM | POA: Diagnosis not present

## 2020-06-15 NOTE — Progress Notes (Signed)
[name] [name], [name] [name] ([id number]) Visit Report for 06/14/2020 Debridement Details Patient Name: Date of Service: [name] 06/14/2020 11:00 A M Medical Record Number: [id number] Patient Account Number: [id number] Date of Birth/Sex: Treating RN: Jul 10, 1951 (69 y.o. 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 Performed By: Physician Ricard Dillon., MD Debridement Type: Debridement Level of Consciousness (Pre-procedure): Awake and Alert Pre-procedure Verification/Time Out Yes - 12:24 Taken: Start Time: 12:24 T Area Debrided (L x W): otal 1.3 (cm) x 1.6 (cm) = 2.08 (cm) Tissue and other material debrided: Viable, Non-Viable, Slough, Subcutaneous, Skin: Dermis , Skin: Epidermis, Slough Level: Skin/Subcutaneous Tissue Debridement Description: Excisional Instrument: Curette Bleeding: Moderate Hemostasis Achieved: Pressure End Time: 12:27 Procedural Pain: 0 Post Procedural Pain: 0 Response to Treatment: Procedure was tolerated well Level of Consciousness (Post- Awake and Alert procedure): Post Debridement Measurements of Total Wound Length: (cm) 1.3 Stage: Unstageable/Unclassified Width: (cm) 1.6 Depth: (cm) 0.7 Volume: (cm) 1.144 Character of Wound/Ulcer Post Debridement: Improved Post Procedure Diagnosis Same as Pre-procedure Electronic Signature(s) Signed: 06/14/2020 5:55:30 PM By: Linton Ham MD Signed: 06/15/2020 9:54:21 AM By: Carlene Coria RN Entered By: Linton Ham on 06/14/2020 12:58:03 -------------------------------------------------------------------------------- HPI Details Patient Name: Date of Service: [name], [name] 06/14/2020 11:00 A M Medical Record Number: [id number] Patient Account Number: [id number] Date of Birth/Sex: Treating RN: 04-Feb-1951  (69 y.o. Orvan Falconer Primary Care Provider: [name], [place] RY Other Clinician: Referring Provider: Treating Provider/Extender: [name], [place] RY Weeks in Treatment: 429 History of Present Illness HPI Description: this is a patient who initially came to [place] for wounds on the medial malleoli bilaterally as well as her upper medial lower extremities bilaterally. These wounds eventually healed with assistance of Apligraf's bilaterally. While this was occurring she developed the current wound which opened into a fairly substantial wound on the right lower extremity. These are mostly secondary to venous stasis physiology however the patient also has underlying scleroderma, pulmonary hypertension. The wound has been making good progress lately with the Hydrofera Blue-based dressing. 03/17/2015; patient had a arterial evaluation a year ago. Her right ABI was 0.86 left was 1.0. T brachial index was 0.41 on the right 0.45 on the left. Her oe bilateral common femoral artery waveforms were triphasic. Her white popliteal posterior tibial artery and anterior tibial artery waveforms were monophasic with good amplitude. Luteal artery waveforms were biphasic it was felt that her bilateral great toe pressures are of normal although adequate for tissue healing. 03/24/2015. The condition of this wound is not really improved that. He was covered with as fibrinous surface slough and eschar. This underwent an aggressive debridement with both a curette and scalpel. I still cannot really get down to what I can would consider to be a viable surface. There is no evidence of infection. Previous workup for ischemia roughly a year ago was negative nevertheless I think that continues to be a concern 04/07/15. The patient arrived for application of second Apligraf. Once again the surface of this wound is certainly less viable than I would like for an advanced treatment option. An aggressive debridement was done. She  developed some arteriolar bleeding which required that along pressure and silver alginate. 04/21/15: change in the condition of this wound. Once again it is covered in a gelatinous surface slough. After debridement today  surface of the wound looks somewhat better but now a heeling surface 05/05/15 Apligraf #4 applied.Still a lot of slough on this wound. 05/19/15 Apligraf #5 applied. Still a lot of slough on this wound I did not aggressively remove this 06/02/15; continued copious amounts of surface slough. This debridement fairly easily. 06/08/2015 -- the last time she had a venous duplex study done was over 3 years ago and the surgery was prior to that. I have recommended that she sees Dr. early for a another opinion regarding a repeat venous duplex and possibly more endovenous ablation of vein stripping of micro-phlebotomies. 06/16/15; wound has a gelatinous surface eschar that the debridement fairly easily to a point. I don't disagree with the venous workup and perhaps even arterial re-evaluation. She is on prednisone 5 mg and continue his medications for her pulmonary artery hypertension I am not sure if the latter have any wound care healing issues I would need to investigate this. 06/23/15 continues with a gelatinous surface eschar with of fibrinous underlying. What I can see of this wound does not look unhealthy however I just can't get this material which I think is 2 different layers off. Empiric culture done 06/30/15; unfortunately not a lot of change in this wound. A gelatinous surface eschar is easily removed however it has a tight fibrinous surface underneath the. 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 The wound comes back and basically and unchanged state the. She has a gelatinous surface that is more easily removed however there is a tightly fibrinous surface underneath the. There is no evidence of infection. She has a vascular follow-up next month. I would have to inject  her in order to do a more aggressive debridement of this area 07/21/15 the wound is roughly in the same state albeit the debridement is done with greater ease. There is less of the fibrinous eschar underneath the. There is no evidence of infection. She has follow-up with vascular surgery next week. No evidence of surrounding infection. Her original distal wounds healed while this one formed. 07/28/15; wound is easier to debride. No wound erythema. She is seeing Dr. [name] next week. 08/18/15 Has been to [place] for repeat ablation. Have been using medihoney pad with some improvement 08/25/15; absolutely no change in the condition of this wound in either its overall size or surface condition in many months now. At one point I had this down to [place] healthier surface I think with [facility] however this did not actually progress towards closure. Do not believe that the wound has actually deteriorated in terms of volume at all. We have been using a medihoney pad which allows easier debridement of the gelatinous eschar but again no overall actual improvement. the patient is going towards an ablation with pain and vascular which I think is scheduled for next week. The only other investigation that I could first see would be a biopsy. She does have underlying scleroderma 09/02/15 eschar is much easier to debridement however the base of this does not look particularly vibrant. We changed to Iodoflex. The patient had her ablation earlier this week 09/09/15 again the debridement over the base of this wound is easier and the base of the wound looks considerably better. We will continue the Iodoflex. Dr. Tawni Millers has expressed his satisfaction with the result of her ablation 09/15/15 once again the wound is relatively free of surface eschar. No debridement was done today. It has a pale-looking base to it. although  this is not as deep as it once was it seems to be expanding especially inferiorly. She has had  recent venous ablation but this is no closer to healing.I've gone ahead and done a punch biopsy this from the inferior part of the wound close to normal skin 09/22/15: the wound is relatively free of surface eschar. There is some surrounding eschar. I'm not exactly sure at what level the surface is that I am seeing. 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 No evidence of atypical infection, malignancy. 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 No debridement was done 09/29/15; the wound surface is relatively free of surface eschar. There is eschar surrounding the walls of the wound. Aside from the improvement in the amount of surface slough. This wound has not progressed any towards closure. There is not even a surface that looks like there at this is ready. There is no evidence of any infection nor maligancy based on biopsy I did on 9/15. I continued with the Iodoflex however I am looking towards some alternative to try to promote some closure or filling in of this surface. Consider triple layer [place]. Collagen did not result in adequate control of the surface slough 10/13/15; the patient was in hospital last week with severe anemia. The wound looks somewhat better after debridement although there is widening medially. There is no evidence of infection. 10/20/15; patient's wound on the right lateral lower leg is essentially unchanged. This underwent a light surface debridement and in general the debridement is easier and the surface looks improved. I noted in doing this on the side of the wound what appears to be a piece of calcium deposition. The patient noted that she had previous things on the tips of her fingers. 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 She follows with rheumatology and I have asked them to talk to her about this. In view of that S the nonhealing ulcer  may have something to do with calcinosis and also unrelieved Raynaud's disease in this area. I should note that her original wounds on the right and left medial malleolus and the inner aspects of both legs just below the knees did however heal over 10/30/15; the patient's wound on the right lateral lower leg is essentially unchanged. I was able to remove some calcified material from the medial wound edge. 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 Otherwise the wound appears essentially unchanged there is less adherent surface eschar. Some of the calcified material was sent to pathology for analysis 11/17/15. The patient's wound on the right lateral leg is essentially unchanged. Wider Medially. 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 There is some surface slough but I did not debridement this. No evidence of surrounding infection. 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 This will make healing difficult 12/15/15; the wound has less depth surface has a fibrinous slough and calcifications in the wound edge no evidence of infection 12/22/15; the wound definitely has less Fibrinous slough on the base. Calcifications around the wound edges are still evident. Although the wound bed looks healthier it is still pale in appearance. Previous biopsy did  not show malignancy 01/04/15; surgical debridement of nonviable slough and subcutaneous tissue the wound cleans up quite nicely but appears to be expanding outward calcifications around the wound edges are still there. 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 She is to see her  rheumatologist I'll see if he has any opinions on this. My punch biopsy done in September did not show calcifications although these are clearly evident. 01/19/16 light selective debridement of nonviable surface slough. There is epithelialization medially. This gives me reason for cautious optimism. She has been to see her rheumatologist, there is nothing that can be done for this type of soft tissue calcification associated with scleroderma 02/02/16 no debridement although there is a light surface slough. She has 2 peninsulas of skin 1 inferiorly and one medially. We continue to make a slight and slow but definite progressive here 02/16/16; light surface debridement with more attention to the circumference of the wound bed where the fibrinous eschar is more prevalent. No calcifications detected. She seems to have done nicely with the [facility] with some epithelialization and some improvement in the overall wound volume. 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 Continued attention to the circumference of the wound where the fibrinous eschar in calcinosis or prevalent. No calcifications were detected. She has continued improvement with Hydrofera Blue. The wound is no longer as deep 03/15/16 surgical debridement done to remove surface escha Especially around the circumference of the wound where there is nonviable subcutaneous tissue. In spite of this there is considerable improvement in the overall dimensions and depth of the wound. Islands of epithelialization are seen especially medially inferiorly and superiorly to a lesser extent. She is using Hydrofera Blue at home 03/29/16; surgical debridement done to remove surface eschar and nonviable subcutaneous tissue. This cleans up quite nicely mention slightly larger in terms of length and width however depth is  less 04/12/16; continued gradual improvement in terms of depth and the condition of the wound base. Debridement is done. Continuing long standing Hydrofera Blue at home with Kerlix Coban wraps 04/26/16; continued gradual improvement in terms of depth and management as well as condition of the wound base. Surgical debridement done she continues with Hydrofera Blue. 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 She initially came to this clinic venous insufficiency ulcers which have long since healed 05/17/16 continued improvement in terms of the depth and measurements of this wound although she has a tightly adherent fibrinous slough each time. We've been continued with long standing Hydrofera Blue which seems to done as well for this wound is many advanced treatment options. 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 She also has chronic venous insufficiency. She has an irritation on her lateral right ankle secondary to our wraps 05/10/16; wound appears to be smaller especially on the medial aspect and especially in the width. Wound was debridement surface looks better. She is also been in the hospital apparently with anemia again she tells me she had an endoscopy. Since she got home after 3 days which I believe was sometime last week she has had an irritated painful area on the right lateral ankle surrounding the lateral malleolus 05/31/16; much more adherent surface slough today then recently although I don't think the dimensions of changed that much. A more aggressive debridement is required. The  irritated area over her medial malleolus is more pruritic and painful and I don't think represents cellulitis 06/14/16; no major change in her wound dimensions however there is more tightly adherent surface slough which is disappointing. As well as she appears to have a new small area medially. Furthermore an irritated uncomfortable area on  the lateral aspect of her right foot just below the lateral malleolus. 06/21/16; I'm seeing the patient back in follow-up for the new areas under her major wound on the right anterior leg. She has been using Hydrofera Blue to this area probably for several months now and although the dressings seem to be helping for quite a period of time I think things have stagnated lately. She comes in today with a relatively tight adherent surface slough and really no changes in the wound shape or dimension. The 2 small areas she had inferiorly are tiny but still open they seem improved this well. There is no uncontrolled edema and I don't think there is any evidence of cellulitis. 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 Patient has had vascular evaluations both venous and arterial. I have biopsied this area. There is no obvious infection. The worrisome thing today is that she seems to be developing areas of erythema and epithelial damage on the medial aspect of the right foot. Also to a lesser degree inferior to the actual wound itself. Again I see no obvious changes to suggest cellulitis however as this is the only treatable option I will probably give her antibiotics. 07/13/16 no major change in the lady's large anterior right leg wound. Still covered with a very tightly adherent surface slough which is difficult to debridement. There is less erythema around this, culture last week grew pseudomonas I gave her ciprofloxacin. The area on her lateral right malleolus looks better- 08/02/16 the patient's wounds continued to decline. Her original large anterior right leg wound looks deeper. Still adherent surface slough that is difficult to debridement. She has a small area just below this and a punched-out wound just below her lateral malleolus. In the meantime she is been in hospital with apparently an upper GI  bleed on Plavix and aspirin. She is now just on Plavix she received 3 units of packed cells 08/23/16; since I last saw this 3 weeks ago, the open large area on her right leg looks about the same syrup. She has a small satellite lesion just underneath this. The area on her medial right ankle is now a deep necrotic wound. I attempted to debridement this however there is just too much pain. 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 She follows with vascular surgery and is scheduled for an angiogram in early September 09/06/16; the patient is going for an arteriogram tomorrow. Her original large wound on the right calf is about the same the satellite lesion underneath it is about the same however the area on her medial ankle is now deeper with exposed tendon. I am no longer attempting to debride these wounds 09/20/16; the patient has undergone a right femoral endarterectomy and Dacron patch angioplasty. This seems to have helped the flow in her right leg. 10/04/16; Arrived today for aggressive debridement of the wounds on her right calf the original wound the one beneath it and a difficult area over her right lateral leg just above  the lateral malleolus. 10/25/16; her 3 open wounds are about the same in terms of dimensions however the surface appears a lot healthier post debridement. Using Iodoflex 10/18/16 we have been using Iodoflex to her wounds which she tolerated with some difficulty. 10/11/16; has been using Santyl for a period of time with some improvement although again very adherent surface slough would prevent any attempted healing this. She has a original wound on the left calf, the satellite underneath that and the most recent wound on the right medial ankle. She has completed revascularization by Dr. early and has had venous ablation earlier. Want to go back to Iodoflex to see if week and get a healthier surface to this wound  bed failing this I think she'll need to be taken to the OR and I am prepared to call Dr. Marla Roe to discuss this. She is obviously not a good candidate for general anesthesia however.; 11/08/16; I put her on Iodoflex last time to see if I can get the wound bed any healthier and unfortunately today still had tremendous surface slough. 11/15/16; 4 weeks' worth of Iodoflex with not much improvement. Debridement on the major wounds on her left anterior leg is easier however this does not maintain from week to week. The punched-out area on her medial right ankle 11/29/16; I attempted to change the patient last visit to [facility] however she states this burned and was very uncomfortable therefore we gave her permission to go back to the eye out of complex which she already had at home. Also she noted a lot of pain and swelling on the lateral aspect of her leg before she traveled to [facility] for the holidays, I called her in doxycycline over the phone. This seems to have helped 12/06/16; Wounds unchanged by in large. Using Iodoflex 12/13/16; her wounds today actually looks somewhat better. The area on the right lateral lower leg has reasonably healthy-looking granulation and perhaps as actually filled and a bit. Debridement of the 2 wounds on the medial calf is easier and post debridement appears to have a healthier base. We have referred her to Dr. Migdalia Dk for consideration of operative debridement 12/20/16; we have a quick note from Dr. Merri Ray who feels that the patient needs to be referred to an academic center/plastic surgeon. This is due to the complexity of the patient's medical issues as it applies to anything in the OR. We have been using Iodoflex 12/27/16; in general the wounds on the right leg are better in terms of the difficult to remove surface slough. She has been using Iodoflex. She is approved for Apligraf which I anticipated ordering in the next week or 2  when we get a better-looking surface 01/04/16 the deep wounds on the right leg generally look better. Both of them are debrided further surface slough. The area on the lateral right leg was not debrided. She is approved for Apligraf I think I'll probably order this either next week or the week after depending on the surface of the wounds superiorly. We have been using Iodoflex which will continue until then 01/11/16; the deep wounds on the right leg again have a surface slough that requires debridement. I've not been able to get the wound bed on either one of these wounds down to what would be acceptable for an advanced skin stab-like Apligraf. The area on the medial leg has been improving. We have been using hot Iodoflex to all wounds which seems to do the best at at least  limiting the nonviable surface 01/24/17; we have continued Iodoflex and all her wound areas. Her debridement Gen. he is easier and the surface underneath this looks viable. Nevertheless these are large area wounds with exposed muscle at least on the anterior parts. We have ordered Apligraf's for 2 weeks from now. The patient will be away next week 02/07/17; the patient was close to have first Apligraf today however we did not order one. I therefore replaced her Iodoflex. She essentially has 3 large punched- out areas on her right anterior leg and right medial ankle. 02/11/17; Apligraf #1 02/25/17; Apligraf #2. In general some improvement in the right medial ankle and right anterior leg wounds. The larger wound medially perhaps some better 03/11/17; Apligraf #3. In general continued improvement in the right medial ankle and the right anterior leg wounds. 03/25/17 Apligraf #4. In general continued improvement especially on the right medial ankle and the lower 04/08/17; Apligraf #5 in general continued improvement in all wound sites. 04/22/17; post Apligraf #5 her wound beds continued to look a lot better all of them up to the surface of the  surrounding skin. Had a caramel-colored slough that I did not debrided in case there is residual Apligraf effect. The wounds are as good as I have seen these looking quite some time. 04/29/17; we applied [place] and last week after completing her Apligraf. Wounds look as though they've contracted somewhat although they have a nonviable surface which was problematic in the past. Apparently she has been approved for further Apligraf's. We applied Iodosorb today after debridement. 05/06/17; we're fortunate enough today to be able to apply additional Apligraf approved by her insurance. In general all of her wounds look better Apligraf #6 05/24/17; Apligraf #7 continued improvement in all wounds 3 06/10/17 Apligraf #8. Continued improvement in the surface of all wounds. Not much of an improvement in dimensions as I might a follow 06/24/17 Apligraf #9 continued general improvement although not as much change in the wound areas I might of like. She has a new open area on the right anterior lateral ankle very small and superficial. She also has a necrotic wound on the tip of her right index finger probably secondary to severe uncontrolled Raynaud's phenomenon. She is already on sildenafil and already seen her rheumatologist who gave her Keflex. 07/08/17; Apligraf #10. Generalized improvement although she has a small additional wound just medial to the major wound area. 07/22/17; after discussion we decided not to reorder any further Apligraf's although there is been considerable improvement with these it hasn't been recently. The major wound anteriorly looks better. Smaller wounds beneath this and the more recent one and laterally look about the same. The area on the right lateral lower leg looks about the same 07/29/17 this is a patient who is exceedingly complex. She has advanced scleroderma, crest syndrome including calcinosis, PAD status post revascularization, chronic venous insufficiency status post  ablations. She initially presented to this clinic with wounds on her bilateral lower legs however these closed. More recently we have been dealing with a large open area superiorly on the right anterior leg, a smaller wound underneath this and then another one on the right medial lower extremity. These improve significantly with 10 Apligraf applications. Over the last 2-3 weeks we are making good progress with Hydrofera Blue and these seem to be making progress towards closure 08/19/17; wounds continued to make good improvement with Hydrofera Blue and episodic aggressive debridements 08/26/17; still using Hydrofera Blue. Good improvements 09/09/17; using Hydrofera  Blue continued improvement. Area on the lateral part of her right leg has only a small remaining open area. The small inferior satellite region is for all intents and purposes closed. Her major wound also is come in in terms of depth and has advancing epithelialization. 09/16/17; using Hydrofera Blue with continued improvement. The smaller satellite wound. We've closed out today along with a new were satellite wound medially. The area on her medial ankle is still open and her major wound is still open but making improvement. All using Hydrofera Blue. Currently 09/30/17; using Hydrofera Blue. Still a small open area on the lateral right ankle area and her original major wound seems to be making gradual and steady improvement. 10/14/17; still using Hydrofera Blue. Still too small open areas on the right lateral ankle. Her original major wound is horizontal and linear. The most problematic area paradoxically seems to be the area to the medial area wears I thought it would be the lateral. 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 10/28/17; still using Hydrofera Blue. Right lateral ankle has a very small open area superiorly on the most lateral part of the wound. Her original open wound has 2 open  areas now separated by normal skin and we've redefined this. 11/11/17; still using Hydrofera Blue area and right lateral ankle continues to have a small open area on mostly the lateral part of the wound. Her original wound has 2 small open areas now separated by a considerable amount of normal skin 11/28/17; the patient called in slightly before Thanksgiving to report pain and erythema above the wound on the right leg. In the past this is responded well to treatment for cellulitis and I gave her over the phone doxycycline. She stated this resulted in fairly abrupt improvement. We have been using Hydrofera Blue for a prolonged period of time to the larger wound anteriorly into the remaining wound on her right right lateral ankle. The latter is just about closed with only a small linear area and the bottom of the [place]. 12/02/17; use endoform and left the dressing on since last visit. There is no tenderness and no evidence of infection. 12/16/17; patient has been using Endoform but not making much progress. The 2 punched out open areas anteriorly which were the reminiscence of her major wound appear deeper. The area on the lateral aspect of her right calf also appears deeper. Also she has a puzzling tender swelling above her wound on the right leg. This seems larger than last time. Just above her wounds there appears to be some fluctuance in this area it is not erythematous and there is no crepitus 12/30/17; patient has been using Endoform up until last week we used Hydrofera Blue. Ultrasound of the swelling above her 2 major wounds last time was negative for a fluid collection. I gave her cefaclor for the erythema and tenderness in this area which seems better. Unfortunately both punched out areas anteriorly and the area on her right medial lower leg appear deeper. In fact the lateral of the wounds anteriorly actually looks as though it has exposed tendon and/or muscle sheath. She is not systemically  unwell. She is complaining of vaginitis type symptoms presumably Candida from her antibiotics. 01/06/18; we're using santyl. she has 2 punched out areas anteriorly which were initially part of a large wound. Unfortunately medially this is now open to tendon/muscle. All the wounds have the same adherent very difficult to debride surface. 01/20/18; 2  week follow-up using Santyl. She has the 2 punched out areas anteriorly which were initially part of her large surface wound there. Medially this still has exposed muscle. All of these have the same tightly adherent necrotic surface which requires debridement. PuraPly was not accepted by the patient's insurance however her insurance I think it changed therefore we are going to run Apligraf to gain 02/03/18; the patient has been using Santyl. The wound on the right lateral ankle looks improved and the 2 areas anteriorly on the right leg looks about the same. The medial one has exposed muscle. The lateral 1 requiresdebridement. We use PuraPly today for the first week 02/10/18; PuraPly #2. The patient has 3 wounds. The area on the right lateral ankle, 2 areas anteriorly that were part of her original large wound in this area the medial one has exposed muscle. All of the wounds were lightly debrided with a number 3 curet. PuraPly #2 applied the lateral wound on the calf and the right lateral ankle look better. 02/17/18; PuraPly #3. Patient has 3 wounds. The area on the right lateral ankle in 2 areas internally that were part of her original large wound. The lateral area has exposed muscle. She arrived with some complaints of pain around the right ankle. 02/24/18; PuraPly #4; not much change in any of the 3 wound areas. Right lateral ankle, right lateral calf. Both of these required debridement with a #3 curet. She tolerates this marginally. The area on the medial leg still has exposed muscle. Not much change in dimensions 03/03/18 PuraPly #5. The area on the medial ankle  actually looks better however the 2 separate areas that were original parts of the larger right anterior leg wound look as though they're attempting to coalesce. 03/10/18; PuraPly #6. The area on the medial ankle actually continues to look and measures smaller however the 2 separate areas that were part of the original large wound on the right anterior leg have now coalesced. There hasn't been much improvement here. The lateral area actually has underlying exposed muscle 03/17/18-she is here in follow-up evaluation for ulcerations to the right lower extremity. She is voicing no complaints or concerns. She tolerated debridement. Puraply#7 placed 03/24/18; difficult right lower extremity ulcerations. PuraPly #8 place. She is been approved for [employer]. She did very well with Apligraf today however she is apparently reached her "lifetime max" 03/31/18; marginal improvement with PuraPly although her wounds looked as good as they have in several weeks today. Used TheraSkins #1 04/14/18 TheraSkin #2 today 04/28/18 TheraSkins #3. Wound slightly improved 05/12/18; TheraSkin skin #4. Wound response has been variable. 05/27/18 TheraSkin #5. Generally improvement in all wound areas. I've also put her in 3 layer compression to help with the severe venous hypertension 06/09/18; patient is done quite well with the TheraSkins unfortunately we have no further applications. I also put her in 3 layer compression last week and that really seems to of helped. 06/16/18; we have been using silver collagen. Wounds are smaller. Still be open area to the muscle layer of her calf however even that is contracted somewhat. She tells me that at night sometimes she has pain on the right lateral calf at the site of her lower wound. Notable that I put her into 3 layer compression about 3 weeks ago. She states that she dangles her leg over the bed that makes it feel better but she does not describe claudication during the day She is going  to call her secondary insurance to see  if they will continue to cover advanced treatment products I have reviewed her arterial studies from 01/22/17; this showed an ABI in the right of 1 and on the left noncompressible. TBI on the right at 0.30 on the left at 0.34. It is therefore possible she has significant PAD with medial calcification falsely elevating her ABI into the normal range. I'll need to be careful about asking her about this next week it's possible the 3 layer compression is too much 06/23/18; was able to reapply TheraSkin 1 today. Edema control is good and she is not complaining of pain no claudication 07/07/18;no major change. New wound which was apparently a taper removal injury today in our clinic between her 2 wounds on the right calf TheraSkins #2 07/14/18; I think there is some improvement in the right lateral ankle and the medial part of her wound. There is still exposed muscle medially. 07/28/18; two-week follow-up. TheraSkins#3. Unfortunately no major change. She is not a candidate I don't think for skin grafting due to severe venous hypertension associated with her scleroderma and pulmonary hypertension 08/11/18 Patient is here today for her [employer]ask[employer] application #00 (#5 of the second set). She seems to be doing well and in the base of the wound appears to show some progress at this point. This is the last approved Theraskin of the second set. 08/25/18; she has completed TheraSkin. There has been some improvement on the right lateral calf wound as well as the anterior leg wounds. The open area to muscle medially on the anterior leg wound is smaller. I'm going to transition her back to [facility] under Kerlix Coban change every second day. She reports that she had some calcification removed from her right upper arm. We have had previous problems with calcifications in her wounds on her legs but that has not happened recently 09/08/18;using Hydrofera Blue on both her wound areas.  Wounds seem to of contracted nicely. She uses Kerlix Coban wrap and changes at home herself 09/22/2018; using Hydrofera Blue on both her wound areas. Dimensions seem to have come down somewhat. There is certainly less depth in the medial part of the mid tibia wound and I do not think there is any exposed muscle at this point. 10/06/2018; 2-week follow-up. Using [facility] on her wound areas. Dimensions have come down nicely both on the right lateral ankle area in the right mid tibial area. She has no new complaints 10/20/2018; 2-week follow-up. She is using Hydrofera Blue. Not much change from the last time she was here. The area on the lateral ankle has less depth although it has raised edges on one side. I attempted to remove as much of the raised edge as I could without creating more additional wounds. The area on the right anterior mid tibia area looks the same. 11/03/2018; 2-week follow-up she is using Hydrofera Blue. On the right anterior leg she now has 2 wounds separated by a large area of normal skin. The area on the medial part still has I think exposed muscle although this area itself is a lot smaller. The area laterally has some depth. Both areas with necrotic debris. The area on her right lateral ankle has come in nicely 11/17/2018; patient continues to use Hydrofera Blue. We have been increasing separation of the 2 wounds anteriorly which were at one point joint the area on the right lateral calf continues to have I think some improvement in depth. 12/08/2018; patient continues to use Hydrofera Blue. There is some improvement in  the area on the right lateral calf. The 2 areas that were initially part of the original large wound in the mid right tibia are probably about the same. In fact the medial area is probably somewhat larger. We will run puraply through the patient's insurance 12/22/2018; she has been using Hydrofera Blue. We have small wounds on the right lateral calf and 2 small  areas that were initially part of a large wound in the right mid tibia. We applied pure apply #1 today. 12/29/2018; we applied puraply #2. Her wounds look somewhat better especially on the right lateral calf and the lateral part of her original wound in the mid tibial area. 01/05/2019; perhaps slightly improved in terms of wound bed condition but certainly not as much improvement as I might of liked. Puraply #3 1/13: we did not have a correct sized puraply to apply. wounds more pinched out looking, I increased her compression to 3 layer last week to help with significant multilevel venous hypertension. Since then I've reviewed her arterial status. She has a right femoral endarterectomy and a distal left SFA stent. She was being followed by Dr. [name] for a period however she does not appear to have seen him in 3 years. I will set up an appointment. 1/20. The patient has an additional wound on the right lateral calf between the distal wound and proximal wounds. We did not have Puraply last week. Still does not have a follow-up with Dr. early 1/27: Follow-up with Dr. early has been arranged apparently with follow-up noninvasive studies. Wounds are measuring roughly the same although they certainly look smaller 2/3; the patient had non invasive studies. Her ABIs on the right were 0.83 and on the left 1.02 however there was no great toe pressure bilaterally. Also worrisome monophasic waveforms at the PTA and dorsalis pedis. We are still using Puraply. We have had some improvement in all of the wounds especially the lateral part of the mid tibial area. 2/10; sees Dr. early of vein and vascular re-arterial studies next week. Puraply reapplied today. 2/17; Dr. early of vein and vascular his appointment is tomorrow. Puraply reapplied after debridement of all wounds 2/24; the patient saw Dr. early I reviewed his note. sHe noted the previous right femoral endarterectomy with a Dacron patch. He also noted the  normal ABI and the monophasic waveforms suggesting tibial disease. Overall he did not feel that she had any evidence of arterial insufficiency that would impair her wound healing. He did note her venous disease as well. He suggested PRN follow-up. 3/2; I had the last puraply applied today. The original wounds over the mid tibia area are improved where is the area on the right lower leg is not 3/9; wounds are smaller especially in the right mid tibia perhaps slightly in the right lateral calf. We finished with puraply and went to endoform today 3/23; the patient arrives after 2 weeks. She has been using endoform. I think all of her wounds look slightly better which includes the area on the right lateral calf just above the right lateral malleolus and the 2 in the right mid tibia which were initially part of the same wound. 4/27; TELEHEALTH visit; the patient was seen for telehealth visit today with her consent in the middle of the worldwide epidemic. Since she was last here she called in for antibiotics with pain and tenderness around the area on the right medial ankle. I gave her empiric doxycycline. She states this feels better. She is using endoform  on both of these areas 5/11 TELEHEALTH; the patient was seen for telehealth visit today. She was accompanied at home by her husband. She has severe pulmonary hypertension accompanied scleroderma and in the face of the Covid epidemic cannot be safely brought into our clinic. We have been using endoform on her wound areas. There are essentially 3 wound areas now in the left mid tibia now 2 open areas that it one point were connected and one on the right lateral ankle just above the malleolus. 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 She cannot come to clinic. The wounds were  reviewed today via telehealth. She has severe chronic venous hypertension which I think is centrally mediated. She has wounds on her right anterior tibia and right lateral ankle area. These are chronic. She has been using endoform. 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 We have been following her from telehealth. She has severe chronic venous hypertension which may be mostly centrally mediated secondary to right heart heart failure. She has wounds on her right anterior tibia and right lateral ankle these are chronic we have been using endoform 6/22; TELEHEALTH; this patient was seen today via telehealth. 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 Since the last time we had contact with this patient she developed some pain and erythema around the wound on her right lateral malleolus/ankle and we put in antibiotics for her. This is resulted in good improvement with resolution of the erythema and tenderness. I changed her to silver alginate last time. We had been using endoform for an extended period of time 7/13; TELEHEALTH; this patient was seen today via telehealth. 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 She would continue to be at a prohibitive risk to be brought into our clinic unless this was absolutely necessary. These visits have been done with her approval as well as her husband. We have been using silver alginate to the areas on the right mid tibia and right lateral lower leg. 7/27 TELEHEALTH; patient was seen along with her husband today via telehealth. 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 We changed her to sample last visit. She has 2  areas a chronic wound on her right mid tibia and one just above her ankle. These were not the original wounds when she came into this clinic but she developed them during treatment 8/17; she comes in for her first face-to-face visit in a long period. She has a remaining area just medial to the right tibia which is the last open part of her large wound across this area. She also has an area on the right lateral lower leg. We prescribed Santyl last telehealth visit but they were concerned that this was making a deeper so they put silver alginate on it last week. Her husband changes the dressings. 8/31; using Santyl to the 2 wound areas some improvement in wound surfaces. Husband has surgery in 2 weeks we will put her out 3 weeks. Any of the advanced treatment options that I can think  of that would be eligible for this wound would also cause her to have to come in weekly. The risk that the patient is just too high 9/21. Using Santyl to the 2 wound areas. Both of these are somewhat better although the medial mid tibia area still has exposed muscle. Lateral ankle requiring debridement. Using Santyl 10/12; using Santyl to the 2 wound areas. One on the right lateral ankle and the other in the medial calf which still has exposed muscle. Both areas have come down in size and have better looking surfaces. She has made nice progress with santyl 11/2; we have been using Santyl to the 2 wound areas. Right lateral ankle and the other in the mid tibia area the medial part of this still has exposed muscle. 11/23/2019 on evaluation today patient appears to be doing about the same really with regard to her wounds. She is actually not very pleased with how things seem to be progressing at this point she tells me that she really has not noted much improvement unfortunately. With that being said there is no signs of active infection at this time. There is some slough buildup noted at this time which again along with some  dry skin around the edges of the wound I think would benefit her to try to debride some of this away. Fortunately her pain is doing fairly well. She still has exposed muscle in the right medial/tibial area. 12/14; TELEHEALTH; she was changed to [facility] to the right calf wound and right lateral ankle wound when she was here last time. Unfortunately since then she had a fall with a pelvic fracture and a fracture of her wrist. She was apparently hospitalized for 5 days. I have not looked at her discharge summary. She apparently came out of the hospital with a blister on her right heel. She was seen today via telehealth by myself and our case manager. The patient and her husband were present. She has been using Hydrofera Blue at our direction from the last time she was in the clinic. There is been no major improvement in fact the areas appear deeper and with a less viable surface 01/04/2020; TELEHEALTH; the patient was seen today in accompaniment of her husband and our nurse. She has 3 open areas 1 on the right medial mid tibia, one on the right lateral ankle and a large eschared pressure area on the right heel. We have been using Iodoflex to the 2 original wounds. 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 It is simply too perilous for her to be seen in any other way 1/26; TELEHEALTH; the patient was seen today in accompaniment of her husband and our RN one of our nurses. She still has the 3 open areas 1 on the right medial mid tibia which is the remanent of a more extensive wound in this area, one on the right lateral ankle and a large eschared pressure area on the right heel. We have been using Iodoflex to the 2 original wounds and a bed at 9 application to the eschared area on the heel 2/15; the first time we have seen this patient and then several months out of concern for the pandemic. She had a large horizontal wound in the mid tibia. Only the medial aspect of  this is still open. Area on the lateral ankle is just about closed. She had a new pressure ulcer on the right heel which I have removed some of the eschar. We have  been using Iodoflex which I will continue. The area in the mid tibia has a round circle in the middle of exposed muscle. I think we would have to use an advanced treatment product to stimulate granulation over this area. We will run this through her insurance. She is not eligible for plastic surgery for many reasons 3/1; TELEHEALTH. The patient was seen today by telehealth. 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 We have been using Iodoflex to the wound areas which include the right anterior mid tibia, right lateral ankle and the right heel. All of these were reviewed 3/8; the patient's wound just above the right ankle is closed. She still has a contracting black eschar on the heel where she had a pressure ulcer. The medial part of her original wound on the mid tibia has exposed muscle. We have made really made no progress in this area although we have managed to get a lot of the original wound in this area to close. We used Apligraf #1 today 3/22; the patient's ankle wound remains closed. She still has a contracting black eschar on the heel although it seems to have less surface area. It still not clear whether there is any depth here. We used Apligraf #2 today in an attempt to get granulation over the exposed muscle and what is left of her mid tibia area. 4/5; everything is closed except the medial aspect of the mid tibia wound, as well as the pressure injury on the right heel. She has been using Betadine to the right heel which has been gradually contracting. We used Apligraf #3 today. 4/19; Apligraf #4. Still a pressure area on the right heel she has been using Betadine superficial excoriated areas she has been applying salicylic acid to  on the anterior leg and the thick horn on the leg just above her wound area 5/3; Apligraf #5. Unfortunately she came in today with a reopening of the area on the right lateral lower leg. Not much change in the wound we have been treating in the mid calf. Quite a bit of edema surrounding this wound. She reports that she was up on her feet quite a bit. The area on the right heel is separating she has been using Betadine She comes in with a new wound on the left lateral malleolus which is very disappointing this is been open since last week 5/17; we applied her last Apligraf last time. Unfortunately that did not have any effect on the deep area medially in the mid tibial area. However the area over the right lateral malleolus is a lot better. Right heel also is contracted we used Iodoflex last time. The new wound on the left lateral malleolus were using Santyl to. This required debridement. 6/1. Not much change in the area on the right medial mid tibia. Right lateral ankle is improved she has the necrotic area on the right heel which was a pressure area. New area on the left lateral malleolus from last time. I changed her to Iodoflex to the area on the left lateral malleolus she said this hurt I have put her back on Santyl 6/15; right mid tibia is unchanged. I do not think there is anything I can do to this topically that we will get this to granulate over the muscle. And I am furthermore I am not sure that she is a surgical candidate either because of her severe pulmonary issues  or because she really does not want to go through it. The area on the right lateral malleolus is a small punched out area. The area on the left lateral malleolus again small punched out with nonviable surface Pressure ulcer on the right heel at separating black eschar. I went ahead and remove this today she has an area on the left anterior mid tibia just laterally. Geographic wounds debris on the surface. Some erythema here. 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 There is skin changes widely Change the primary dressing to Sorbact to all wound areas. She needs compression on the left leg as well as the right Electronic Signature(s) Signed: 06/14/2020 5:55:30 PM By: Linton Ham MD Entered By: Linton Ham on 06/14/2020 13:00:40 -------------------------------------------------------------------------------- Physical Exam Details Patient Name: Date of Service: [name], [name] 06/14/2020 11:00 A M Medical Record Number: [id number] Patient Account Number: [id number] Date of Birth/Sex: Treating RN: 05/07/1951 (69 y.o. Orvan Falconer Primary Care Provider: [name], [place] RY Other Clinician: Referring Provider: Treating Provider/Extender: [name], [place] RY Weeks in Treatment: [id number] Patient is hypertensive.. Pulse regular and within target range for patient.Marland Kitchen Respirations regular, non-labored and within target range.. Temperature is normal and within the target range for the patient.Marland Kitchen Appears in no distress. Notes Wound exam; Midportion of the right tibia absolutely no change still exposed muscle Right lateral malleolus is a small wound this is improved in terms of dimensions although it is small and punched out Right plantar heel which was initial pressure ulcer. Necrotic debris separating. I removed this with a #3 curette and pickups. Removed some subcutaneous debris as well On the left the left lateral malleolus about the same necrotic debris small punched out area. She has a worrisome area on the left anterior lateral lower leg mid aspect. Geographic wounds with surface debris surrounding erythema which is nontender I suspect this is chronic venous stasis Electronic Signature(s) Signed: 06/14/2020 5:55:30 PM By: Linton Ham MD Entered By: Linton Ham on 06/14/2020  13:02:49 -------------------------------------------------------------------------------- Physician Orders Details Patient Name: Date of Service: [name], [name] 06/14/2020 11:00 A M Medical Record Number: [id number] Patient Account Number: [id number] Date of Birth/Sex: Treating RN: February 06, 1951 (69 y.o. 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 Dressing Change Frequency Do not change entire dressing for one week. Skin Barriers/Peri-Wound Care Moisturizing lotion - both legs Wound Cleansing May shower with protection. Primary Wound Dressing Wound #16 Right Calcaneus Cutimed Sorbact Wound #19 Right,Lateral Malleolus Cutimed Sorbact Wound #20 Left,Lateral Malleolus Cutimed Sorbact Wound #21 Left,Lateral Lower Leg Cutimed Sorbact Wound #5 Right,Medial Lower Leg Cutimed Sorbact Secondary Dressing Wound #16 Right Calcaneus Dry Gauze Heel Cup Wound #19 Right,Lateral Malleolus Dry Gauze Wound #20 Left,Lateral Malleolus Dry Gauze Wound #21 Left,Lateral Lower Leg Dry Gauze Wound #5 Right,Medial Lower Leg Dry  Gauze Edema Control 3 Layer Compression System - Bilateral Avoid standing for long periods of time Elevate legs to the  level of the heart or above for 30 minutes daily and/or when sitting, a frequency of: - throughout the day Off-Loading Turn and reposition every 2 hours Other: - float heels off of bed/chair with pillow under calves Electronic Signature(s) Signed: 06/14/2020 5:55:30 PM By: Linton Ham MD Signed: 06/15/2020 9:54:21 AM By: Carlene Coria RN Entered By: Carlene Coria on 06/14/2020 12:31:54 -------------------------------------------------------------------------------- Problem List Details Patient Name: Date of Service: [name], [name] 06/14/2020 11:00 A M Medical Record Number: [id number] Patient Account Number: [id number] Date of Birth/Sex: Treating RN: 1951-11-28 (69 y.o. 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 02/24/2015 No Yes I87.331 Chronic venous hypertension (idiopathic) with ulcer and inflammation of right 10/04/2016 No Yes lower extremity L89.616 Pressure-induced deep tissue damage of right heel 12/14/2019 No Yes L97.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 05/31/2020 No Yes L97.828 Non-pressure chronic ulcer of other part of left lower leg with other specified 06/14/2020 No Yes severity Inactive Problems ICD-10 Code Description Active Date Inactive Date 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 06/14/2020 11:00 A M Medical Record Number: [id number] Patient Account Number: [id number] Date of Birth/Sex: Treating RN: [birth year] (69 y.o. Orvan Falconer Primary Care Provider: [name], [place] RY Other Clinician: Referring Provider: Treating Provider/Extender: [name], MA RY Weeks in Treatment: 429 Subjective History of Present Illness (HPI) this is a patient who initially came to [place] for wounds on the medial malleoli bilaterally as well as her upper medial lower extremities bilaterally. These wounds eventually healed with assistance of Apligraf's bilaterally. While this was occurring she developed the current wound which opened into a fairly substantial wound on the right lower extremity. These are mostly secondary to venous stasis physiology however the patient also has underlying scleroderma, pulmonary hypertension. The wound has been making good progress lately with the Hydrofera Blue-based dressing. 03/17/2015; patient had a arterial evaluation a year ago. Her right ABI was 0.86 left was 1.0. T brachial index was 0.41 on the right 0.45 on the left. Her oe bilateral common femoral artery waveforms were triphasic. Her white popliteal posterior tibial artery and anterior tibial artery waveforms were monophasic with good amplitude. Luteal artery waveforms were biphasic it was felt that her bilateral great toe pressures are of normal although adequate for tissue healing. 03/24/2015. The condition of this wound is not really improved that. He was covered with as fibrinous surface slough and eschar. This underwent an aggressive debridement with both a curette and  scalpel. I still cannot really get down to what I can would consider to be a viable surface.  There is no evidence of infection. Previous workup for ischemia roughly a year ago was negative nevertheless I think that continues to be a concern 04/07/15. The patient arrived for application of second Apligraf. Once again the surface of this wound is certainly less viable than I would like for an advanced treatment option. An aggressive debridement was done. She developed some arteriolar bleeding which required that along pressure and silver alginate. 04/21/15: change in the condition of this wound. Once again it is covered in a gelatinous surface slough. After debridement today surface of the wound looks somewhat better but now a heeling surface 05/05/15 Apligraf #4 applied.Still a lot of slough on this wound. 05/19/15 Apligraf #5 applied. Still a lot of slough on this wound I did not aggressively remove this 06/02/15; continued copious amounts of surface slough. This debridement fairly easily. 06/08/2015 -- the last time she had a venous duplex study done was over 3 years ago and the surgery was prior to that. I have recommended that she sees Dr. early for a another opinion regarding a repeat venous duplex and possibly more endovenous ablation of vein stripping of micro-phlebotomies. 06/16/15; wound has a gelatinous surface eschar that the debridement fairly easily to a point. I don't disagree with the venous workup and perhaps even arterial re-evaluation. She is on prednisone 5 mg and continue his medications for her pulmonary artery hypertension I am not sure if the latter have any wound care healing issues I would need to investigate this. 06/23/15 continues with a gelatinous surface eschar with of fibrinous underlying. What I can see of this wound does not look unhealthy however I just can't get this material which I think is 2 different layers off. Empiric culture done 06/30/15; unfortunately not a lot of  change in this wound. A gelatinous surface eschar is easily removed however it has a tight fibrinous surface underneath the. 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 The wound comes back and basically and unchanged state the. She has a gelatinous surface that is more easily removed however there is a tightly fibrinous surface underneath the. There is no evidence of infection. She has a vascular follow-up next month. I would have to inject her in order to do a more aggressive debridement of this area 07/21/15 the wound is roughly in the same state albeit the debridement is done with greater ease. There is less of the fibrinous eschar underneath the. There is no evidence of infection. She has follow-up with vascular surgery next week. No evidence of surrounding infection. Her original distal wounds healed while this one formed. 07/28/15; wound is easier to debride. No wound erythema. She is seeing Dr. [name] next week. 08/18/15 Has been to [place] for repeat ablation. Have been using medihoney pad with some improvement 08/25/15; absolutely no change in the condition of this wound in either its overall size or surface condition in many months now. At one point I had this down to [place] healthier surface I think with [facility] however this did not actually progress towards closure. Do not believe that the wound has actually deteriorated in terms of volume at all. We have been using a medihoney pad which allows easier debridement of the gelatinous eschar but again no overall actual improvement. the patient is going towards an ablation with pain and vascular which I think is scheduled for next week. The only other investigation that I could first see would be a biopsy. She does have  underlying scleroderma 09/02/15 eschar is much easier to debridement however the base of this does not look particularly vibrant. We changed to Iodoflex. The patient had her ablation earlier this  week 09/09/15 again the debridement over the base of this wound is easier and the base of the wound looks considerably better. We will continue the Iodoflex. Dr. Tawni Millers has expressed his satisfaction with the result of her ablation 09/15/15 once again the wound is relatively free of surface eschar. No debridement was done today. It has a pale-looking base to it. although this is not as deep as it once was it seems to be expanding especially inferiorly. She has had recent venous ablation but this is no closer to healing.I've gone ahead and done a punch biopsy this from the inferior part of the wound close to normal skin 09/22/15: the wound is relatively free of surface eschar. There is some surrounding eschar. I'm not exactly sure at what level the surface is that I am seeing. 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 No evidence of atypical infection, malignancy. 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 No debridement was done 09/29/15; the wound surface is relatively free of surface eschar. There is eschar surrounding the walls of the wound. Aside from the improvement in the amount of surface slough. This wound has not progressed any towards closure. There is not even a surface that looks like there at this is ready. There is no evidence of any infection nor maligancy based on biopsy I did on 9/15. I continued with the Iodoflex however I am looking towards some alternative to try to promote some closure or filling in of this surface. Consider triple layer Oasis. Collagen did not result in adequate control of the surface slough 10/13/15; the patient was in hospital last week with severe anemia. The wound looks somewhat better after debridement although there is widening medially. There is no evidence of infection. 10/20/15; patient's wound on the right lateral lower leg is essentially unchanged. This underwent a light surface debridement and in  general the debridement is easier and the surface looks improved. I noted in doing this on the side of the wound what appears to be a piece of calcium deposition. The patient noted that she had previous things on the tips of her fingers. 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 She follows with rheumatology and I have asked them to talk to her about this. In view of that S the nonhealing ulcer may have something to do with calcinosis and also unrelieved Raynaud's disease in this area. I should note that her original wounds on the right and left medial malleolus and the inner aspects of both legs just below the knees did however heal over 10/30/15; the patient's wound on the right lateral lower leg is essentially unchanged. I was able to remove some calcified material from the medial wound edge. 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 Otherwise the wound appears essentially unchanged there is less adherent surface eschar. Some of the calcified material was sent to pathology for analysis 11/17/15. The patient's wound on the right lateral leg is essentially unchanged. Wider Medially. 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 There is some surface slough but I did not debridement this. No evidence of surrounding infection. 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 This will make healing difficult 12/15/15; the wound has less depth surface has a fibrinous slough and calcifications in the wound edge no evidence of infection 12/22/15; the wound definitely has less Fibrinous slough on the base.  Calcifications around the wound edges are still evident. Although the wound bed looks healthier it is still pale in appearance. Previous biopsy did not show malignancy 01/04/15; surgical debridement of nonviable slough and subcutaneous tissue the wound cleans up quite nicely but appears to be expanding outward calcifications around the wound edges are still there. 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 She is to see her rheumatologist I'll see if he has any opinions on this. My punch biopsy done in September did not show calcifications although these are clearly evident. 01/19/16 light selective debridement of nonviable surface slough. There is epithelialization medially. This gives me reason for cautious optimism. She has been to see her rheumatologist, there is nothing that can be done for this type of soft tissue calcification associated with scleroderma 02/02/16 no debridement although there is a light surface slough. She has 2 peninsulas of skin 1 inferiorly and one medially. We continue to make a slight and slow but definite progressive here 02/16/16; light surface debridement with more attention to the circumference of the wound bed where the fibrinous eschar is more prevalent. No calcifications detected. She seems to have done nicely with the [facility] with some epithelialization and some improvement in the overall wound volume. 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 Continued attention to the circumference of the wound where the fibrinous eschar in calcinosis or prevalent. No calcifications were detected. She has continued improvement with Hydrofera Blue. The wound is no longer as deep 03/15/16 surgical debridement done to remove surface escha Especially around the circumference of the wound where there is nonviable  subcutaneous tissue. In spite of this there is considerable improvement in the overall dimensions and depth of the wound. [place] of epithelialization are seen especially medially inferiorly and superiorly to a lesser extent. She is using Hydrofera Blue at home 03/29/16; surgical debridement done to remove surface eschar and nonviable subcutaneous tissue. This cleans up quite nicely mention slightly larger in terms of length and width however depth is less 04/12/16; continued gradual improvement in terms of depth and the condition of the wound base. Debridement is done. Continuing long standing Hydrofera Blue at home with Kerlix Coban wraps 04/26/16; continued gradual improvement in terms of depth and management as well as condition of the wound base. Surgical debridement done she continues with Hydrofera Blue. 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 She initially came to this clinic venous insufficiency ulcers which have long since healed 05/17/16 continued improvement in terms of the depth and measurements of this wound although she has a tightly adherent fibrinous slough each time. We've been continued with long standing Hydrofera Blue which seems to done as well for this wound is many advanced treatment options. 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 She also has chronic venous insufficiency. She has an irritation on her lateral right ankle secondary to our wraps 05/10/16; wound appears  to be smaller especially on the medial aspect and especially in the width. Wound was debridement surface looks better. She is also been in the hospital apparently with anemia again she tells me she had an endoscopy. Since she got home after 3 days which I believe was sometime last week she has had an irritated painful area on the right lateral ankle surrounding the lateral malleolus 05/31/16; much more adherent surface slough today then  recently although I don't think the dimensions of changed that much. A more aggressive debridement is required. The irritated area over her medial malleolus is more pruritic and painful and I don't think represents cellulitis 06/14/16; no major change in her wound dimensions however there is more tightly adherent surface slough which is disappointing. As well as she appears to have a new small area medially. Furthermore an irritated uncomfortable area on the lateral aspect of her right foot just below the lateral malleolus. 06/21/16; I'm seeing the patient back in follow-up for the new areas under her major wound on the right anterior leg. She has been using Hydrofera Blue to this area probably for several months now and although the dressings seem to be helping for quite a period of time I think things have stagnated lately. She comes in today with a relatively tight adherent surface slough and really no changes in the wound shape or dimension. The 2 small areas she had inferiorly are tiny but still open they seem improved this well. There is no uncontrolled edema and I don't think there is any evidence of cellulitis. 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 Patient has had vascular evaluations both venous and arterial. I have biopsied this area. There is no obvious infection. The worrisome thing today is that she seems to be developing areas of erythema and epithelial damage on the medial aspect of the right foot. Also to a lesser degree inferior to the actual wound itself. Again I see no obvious changes to suggest cellulitis however as this is the only treatable option I will probably give her antibiotics. 07/13/16 no major change in the lady's large anterior right leg wound. Still covered with a very tightly adherent surface slough which is difficult to debridement. There is less erythema  around this, culture last week grew pseudomonas I gave her ciprofloxacin. The area on her lateral right malleolus looks better- 08/02/16 the patient's wounds continued to decline. Her original large anterior right leg wound looks deeper. Still adherent surface slough that is difficult to debridement. She has a small area just below this and a punched-out wound just below her lateral malleolus. In the meantime she is been in hospital with apparently an upper GI bleed on Plavix and aspirin. She is now just on Plavix she received 3 units of packed cells 08/23/16; since I last saw this 3 weeks ago, the open large area on her right leg looks about the same syrup. She has a small satellite lesion just underneath this. The area on her medial right ankle is now a deep necrotic wound. I attempted to debridement this however there is just too much pain. 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 She follows with vascular surgery and is scheduled for an angiogram in early September 09/06/16; the patient is going for an arteriogram tomorrow. Her original large wound on  the right calf is about the same the satellite lesion underneath it is about the same however the area on her medial ankle is now deeper with exposed tendon. I am no longer attempting to debride these wounds 09/20/16; the patient has undergone a right femoral endarterectomy and Dacron patch angioplasty. This seems to have helped the flow in her right leg. 10/04/16; Arrived today for aggressive debridement of the wounds on her right calf the original wound the one beneath it and a difficult area over her right lateral leg just above the lateral malleolus. 10/25/16; her 3 open wounds are about the same in terms of dimensions however the surface appears a lot healthier post debridement. Using Iodoflex 10/18/16 we have been using Iodoflex to her wounds which she tolerated with some  difficulty. 10/11/16; has been using Santyl for a period of time with some improvement although again very adherent surface slough would prevent any attempted healing this. She has a original wound on the left calf, the satellite underneath that and the most recent wound on the right medial ankle. She has completed revascularization by Dr. early and has had venous ablation earlier. Want to go back to Iodoflex to see if week and get a healthier surface to this wound bed failing this I think she'll need to be taken to the OR and I am prepared to call Dr. Marla Roe to discuss this. She is obviously not a good candidate for general anesthesia however.; 11/08/16; I put her on Iodoflex last time to see if I can get the wound bed any healthier and unfortunately today still had tremendous surface slough. 11/15/16; 4 weeks' worth of Iodoflex with not much improvement. Debridement on the major wounds on her left anterior leg is easier however this does not maintain from week to week. The punched-out area on her medial right ankle 11/29/16; I attempted to change the patient last visit to [facility] however she states this burned and was very uncomfortable therefore we gave her permission to go back to the eye out of complex which she already had at home. Also she noted a lot of pain and swelling on the lateral aspect of her leg before she traveled to [facility] for the holidays, I called her in doxycycline over the phone. This seems to have helped 12/06/16; Wounds unchanged by in large. Using Iodoflex 12/13/16; her wounds today actually looks somewhat better. The area on the right lateral lower leg has reasonably healthy-looking granulation and perhaps as actually filled and a bit. Debridement of the 2 wounds on the medial calf is easier and post debridement appears to have a healthier base. We have referred her to Dr. Migdalia Dk for consideration of operative debridement 12/20/16; we have a quick  note from Dr. Merri Ray who feels that the patient needs to be referred to an academic center/plastic surgeon. This is due to the complexity of the patient's medical issues as it applies to anything in the OR. We have been using Iodoflex 12/27/16; in general the wounds on the right leg are better in terms of the difficult to remove surface slough. She has been using Iodoflex. She is approved for Apligraf which I anticipated ordering in the next week or 2 when we get a better-looking surface 01/04/16 the deep wounds on the right leg generally look better. Both of them are debrided further surface slough. The area on the lateral right leg was not debrided. She is approved for Apligraf I think I'll probably order this either next  week or the week after depending on the surface of the wounds superiorly. We have been using Iodoflex which will continue until then 01/11/16; the deep wounds on the right leg again have a surface slough that requires debridement. I've not been able to get the wound bed on either one of these wounds down to what would be acceptable for an advanced skin stab-like Apligraf. The area on the medial leg has been improving. We have been using hot Iodoflex to all wounds which seems to do the best at at least limiting the nonviable surface 01/24/17; we have continued Iodoflex and all her wound areas. Her debridement Gen. he is easier and the surface underneath this looks viable. Nevertheless these are large area wounds with exposed muscle at least on the anterior parts. We have ordered Apligraf's for 2 weeks from now. The patient will be away next week 02/07/17; the patient was close to have first Apligraf today however we did not order one. I therefore replaced her Iodoflex. She essentially has 3 large punched- out areas on her right anterior leg and right medial ankle. 02/11/17; Apligraf #1 02/25/17; Apligraf #2. In general some improvement in the right medial ankle and right  anterior leg wounds. The larger wound medially perhaps some better 03/11/17; Apligraf #3. In general continued improvement in the right medial ankle and the right anterior leg wounds. 03/25/17 Apligraf #4. In general continued improvement especially on the right medial ankle and the lower 04/08/17; Apligraf #5 in general continued improvement in all wound sites. 04/22/17; post Apligraf #5 her wound beds continued to look a lot better all of them up to the surface of the surrounding skin. Had a caramel-colored slough that I did not debrided in case there is residual Apligraf effect. The wounds are as good as I have seen these looking quite some time. 04/29/17; we applied [place] and last week after completing her Apligraf. Wounds look as though they've contracted somewhat although they have a nonviable surface which was problematic in the past. Apparently she has been approved for further Apligraf's. We applied Iodosorb today after debridement. 05/06/17; we're fortunate enough today to be able to apply additional Apligraf approved by her insurance. In general all of her wounds look better Apligraf #6 05/24/17; Apligraf #7 continued improvement in all wounds o3 06/10/17 Apligraf #8. Continued improvement in the surface of all wounds. Not much of an improvement in dimensions as I might a follow 06/24/17 Apligraf #9 continued general improvement although not as much change in the wound areas I might of like. She has a new open area on the right anterior lateral ankle very small and superficial. She also has a necrotic wound on the tip of her right index finger probably secondary to severe uncontrolled Raynaud's phenomenon. She is already on sildenafil and already seen her rheumatologist who gave her Keflex. 07/08/17; Apligraf #10. Generalized improvement although she has a small additional wound just medial to the major wound area. 07/22/17; after discussion we decided not to reorder any further Apligraf's  although there is been considerable improvement with these it hasn't been recently. The major wound anteriorly looks better. Smaller wounds beneath this and the more recent one and laterally look about the same. The area on the right lateral lower leg looks about the same 07/29/17 this is a patient who is exceedingly complex. She has advanced scleroderma, crest syndrome including calcinosis, PAD status post revascularization, chronic venous insufficiency status post ablations. She initially presented to this clinic with wounds  on her bilateral lower legs however these closed. More recently we have been dealing with a large open area superiorly on the right anterior leg, a smaller wound underneath this and then another one on the right medial lower extremity. These improve significantly with 10 Apligraf applications. Over the last 2-3 weeks we are making good progress with Hydrofera Blue and these seem to be making progress towards closure 08/19/17; wounds continued to make good improvement with Hydrofera Blue and episodic aggressive debridements 08/26/17; still using Hydrofera Blue. Good improvements 09/09/17; using Hydrofera Blue continued improvement. Area on the lateral part of her right leg has only a small remaining open area. The small inferior satellite region is for all intents and purposes closed. Her major wound also is come in in terms of depth and has advancing epithelialization. 09/16/17; using Hydrofera Blue with continued improvement. The smaller satellite wound. We've closed out today along with a new were satellite wound medially. The area on her medial ankle is still open and her major wound is still open but making improvement. All using Hydrofera Blue. Currently 09/30/17; using Hydrofera Blue. Still a small open area on the lateral right ankle area and her original major wound seems to be making gradual and steady improvement. 10/14/17; still using Hydrofera Blue. Still too small open  areas on the right lateral ankle. Her original major wound is horizontal and linear. The most problematic area paradoxically seems to be the area to the medial area wears I thought it would be the lateral. 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 10/28/17; still using Hydrofera Blue. Right lateral ankle has a very small open area superiorly on the most lateral part of the wound. Her original open wound has 2 open areas now separated by normal skin and we've redefined this. 11/11/17; still using Hydrofera Blue area and right lateral ankle continues to have a small open area on mostly the lateral part of the wound. Her original wound has 2 small open areas now separated by a considerable amount of normal skin 11/28/17; the patient called in slightly before Thanksgiving to report pain and erythema above the wound on the right leg. In the past this is responded well to treatment for cellulitis and I gave her over the phone doxycycline. She stated this resulted in fairly abrupt improvement. We have been using Hydrofera Blue for a prolonged period of time to the larger wound anteriorly into the remaining wound on her right right lateral ankle. The latter is just about closed with only a small linear area and the bottom of the [place]. 12/02/17; use endoform and left the dressing on since last visit. There is no tenderness and no evidence of infection. 12/16/17; patient has been using Endoform but not making much progress. The 2 punched out open areas anteriorly which were the reminiscence of her major wound appear deeper. The area on the lateral aspect of her right calf also appears deeper. Also she has a puzzling tender swelling above her wound on the right leg. This seems larger than last time. Just above her wounds there appears to be some fluctuance in this area it is not erythematous and there is no crepitus 12/30/17; patient has been using Endoform up  until last week we used Hydrofera Blue. Ultrasound of the swelling above her 2 major wounds last time was negative for a fluid collection. I gave her cefaclor for the erythema and tenderness in this area which seems better. Unfortunately  both punched out areas anteriorly and the area on her right medial lower leg appear deeper. In fact the lateral of the wounds anteriorly actually looks as though it has exposed tendon and/or muscle sheath. She is not systemically unwell. She is complaining of vaginitis type symptoms presumably Candida from her antibiotics. 01/06/18; we're using santyl. she has 2 punched out areas anteriorly which were initially part of a large wound. Unfortunately medially this is now open to tendon/muscle. All the wounds have the same adherent very difficult to debride surface. 01/20/18; 2 week follow-up using Santyl. She has the 2 punched out areas anteriorly which were initially part of her large surface wound there. Medially this still has exposed muscle. All of these have the same tightly adherent necrotic surface which requires debridement. PuraPly was not accepted by the patient's insurance however her insurance I think it changed therefore we are going to run Apligraf to gain 02/03/18; the patient has been using Santyl. The wound on the right lateral ankle looks improved and the 2 areas anteriorly on the right leg looks about the same. The medial one has exposed muscle. The lateral 1 requiresdebridement. We use PuraPly today for the first week 02/10/18; PuraPly #2. The patient has 3 wounds. The area on the right lateral ankle, 2 areas anteriorly that were part of her original large wound in this area the medial one has exposed muscle. All of the wounds were lightly debrided with a number 3 curet. PuraPly #2 applied the lateral wound on the calf and the right lateral ankle look better. 02/17/18; PuraPly #3. Patient has 3 wounds. The area on the right lateral ankle in 2 areas internally  that were part of her original large wound. The lateral area has exposed muscle. She arrived with some complaints of pain around the right ankle. 02/24/18; PuraPly #4; not much change in any of the 3 wound areas. Right lateral ankle, right lateral calf. Both of these required debridement with a #3 curet. She tolerates this marginally. The area on the medial leg still has exposed muscle. Not much change in dimensions 03/03/18 PuraPly #5. The area on the medial ankle actually looks better however the 2 separate areas that were original parts of the larger right anterior leg wound look as though they're attempting to coalesce. 03/10/18; PuraPly #6. The area on the medial ankle actually continues to look and measures smaller however the 2 separate areas that were part of the original large wound on the right anterior leg have now coalesced. There hasn't been much improvement here. The lateral area actually has underlying exposed muscle 03/17/18-she is here in follow-up evaluation for ulcerations to the right lower extremity. She is voicing no complaints or concerns. She tolerated debridement. Puraply#7 placed 03/24/18; difficult right lower extremity ulcerations. PuraPly #8 place. She is been approved for [employer]. She did very well with Apligraf today however she is apparently reached her "lifetime max" 03/31/18; marginal improvement with PuraPly although her wounds looked as good as they have in several weeks today. Used TheraSkins #1 04/14/18 TheraSkin #2 today 04/28/18 TheraSkins #3. Wound slightly improved 05/12/18; TheraSkin skin #4. Wound response has been variable. 05/27/18 TheraSkin #5. Generally improvement in all wound areas. I've also put her in 3 layer compression to help with the severe venous hypertension 06/09/18; patient is done quite well with the TheraSkins unfortunately we have no further applications. I also put her in 3 layer compression last week and that really seems to of  helped. 06/16/18; we have  been using silver collagen. Wounds are smaller. Still be open area to the muscle layer of her calf however even that is contracted somewhat. She tells me that at night sometimes she has pain on the right lateral calf at the site of her lower wound. Notable that I put her into 3 layer compression about 3 weeks ago. She states that she dangles her leg over the bed that makes it feel better but she does not describe claudication during the day ooShe is going to call her secondary insurance to see if they will continue to cover advanced treatment products I have reviewed her arterial studies from 01/22/17; this showed an ABI in the right of 1 and on the left noncompressible. TBI on the right at 0.30 on the left at 0.34. It is therefore possible she has significant PAD with medial calcification falsely elevating her ABI into the normal range. I'll need to be careful about asking her about this next week it's possible the 3 layer compression is too much 06/23/18; was able to reapply TheraSkin 1 today. Edema control is good and she is not complaining of pain no claudication 07/07/18;no major change. New wound which was apparently a taper removal injury today in our clinic between her 2 wounds on the right calf TheraSkins #2 07/14/18; I think there is some improvement in the right lateral ankle and the medial part of her wound. There is still exposed muscle medially. 07/28/18; two-week follow-up. TheraSkins#3. Unfortunately no major change. She is not a candidate I don't think for skin grafting due to severe venous hypertension associated with her scleroderma and pulmonary hypertension 08/11/18 Patient is here today for her [employer] application #51 (#5 of the second set). She seems to be doing well and in the base of the wound appears to show some progress at this point. This is the last approved [employer] of the second set. 08/25/18; she has completed TheraSkin. There has been some  improvement on the right lateral calf wound as well as the anterior leg wounds. The open area to muscle medially on the anterior leg wound is smaller. I'm going to transition her back to [facility] under Kerlix Coban change every second day. She reports that she had some calcification removed from her right upper arm. We have had previous problems with calcifications in her wounds on her legs but that has not happened recently 09/08/18;using Hydrofera Blue on both her wound areas. Wounds seem to of contracted nicely. She uses Kerlix Coban wrap and changes at home herself 09/22/2018; using Hydrofera Blue on both her wound areas. Dimensions seem to have come down somewhat. There is certainly less depth in the medial part of the mid tibia wound and I do not think there is any exposed muscle at this point. 10/06/2018; 2-week follow-up. Using [facility] on her wound areas. Dimensions have come down nicely both on the right lateral ankle area in the right mid tibial area. She has no new complaints 10/20/2018; 2-week follow-up. She is using Hydrofera Blue. Not much change from the last time she was here. The area on the lateral ankle has less depth although it has raised edges on one side. I attempted to remove as much of the raised edge as I could without creating more additional wounds. The area on the right anterior mid tibia area looks the same. 11/03/2018; 2-week follow-up she is using Hydrofera Blue. On the right anterior leg she now has 2 wounds separated by a large area of normal skin.  The area on the medial part still has I think exposed muscle although this area itself is a lot smaller. The area laterally has some depth. Both areas with necrotic debris. ooThe area on her right lateral ankle has come in nicely 11/17/2018; patient continues to use Hydrofera Blue. We have been increasing separation of the 2 wounds anteriorly which were at one point joint the area on the right lateral calf  continues to have I think some improvement in depth. 12/08/2018; patient continues to use Hydrofera Blue. There is some improvement in the area on the right lateral calf. The 2 areas that were initially part of the original large wound in the mid right tibia are probably about the same. In fact the medial area is probably somewhat larger. We will run puraply through the patient's insurance 12/22/2018; she has been using Hydrofera Blue. We have small wounds on the right lateral calf and 2 small areas that were initially part of a large wound in the right mid tibia. We applied pure apply #1 today. 12/29/2018; we applied puraply #2. Her wounds look somewhat better especially on the right lateral calf and the lateral part of her original wound in the mid tibial area. 01/05/2019; perhaps slightly improved in terms of wound bed condition but certainly not as much improvement as I might of liked. Puraply #3 1/13: we did not have a correct sized puraply to apply. wounds more pinched out looking, I increased her compression to 3 layer last week to help with significant multilevel venous hypertension. Since then I've reviewed her arterial status. She has a right femoral endarterectomy and a distal left SFA stent. She was being followed by Dr. [name] for a period however she does not appear to have seen him in 3 years. I will set up an appointment. 1/20. The patient has an additional wound on the right lateral calf between the distal wound and proximal wounds. We did not have Puraply last week. Still does not have a follow-up with Dr. early 1/27: Follow-up with Dr. early has been arranged apparently with follow-up noninvasive studies. Wounds are measuring roughly the same although they certainly look smaller 2/3; the patient had non invasive studies. Her ABIs on the right were 0.83 and on the left 1.02 however there was no great toe pressure bilaterally. Also worrisome monophasic waveforms at the PTA and dorsalis  pedis. We are still using Puraply. We have had some improvement in all of the wounds especially the lateral part of the mid tibial area. 2/10; sees Dr. early of vein and vascular re-arterial studies next week. Puraply reapplied today. 2/17; Dr. early of vein and vascular his appointment is tomorrow. Puraply reapplied after debridement of all wounds 2/24; the patient saw Dr. early I reviewed his note. sHe noted the previous right femoral endarterectomy with a Dacron patch. He also noted the normal ABI and the monophasic waveforms suggesting tibial disease. Overall he did not feel that she had any evidence of arterial insufficiency that would impair her wound healing. He did note her venous disease as well. He suggested PRN follow-up. 3/2; I had the last puraply applied today. The original wounds over the mid tibia area are improved where is the area on the right lower leg is not 3/9; wounds are smaller especially in the right mid tibia perhaps slightly in the right lateral calf. We finished with puraply and went to endoform today 3/23; the patient arrives after 2 weeks. She has been using endoform. I think all of  her wounds look slightly better which includes the area on the right lateral calf just above the right lateral malleolus and the 2 in the right mid tibia which were initially part of the same wound. 4/27; TELEHEALTH visit; the patient was seen for telehealth visit today with her consent in the middle of the worldwide epidemic. Since she was last here she called in for antibiotics with pain and tenderness around the area on the right medial ankle. I gave her empiric doxycycline. She states this feels better. She is using endoform on both of these areas 5/11 TELEHEALTH; the patient was seen for telehealth visit today. She was accompanied at home by her husband. She has severe pulmonary hypertension accompanied scleroderma and in the face of the Covid epidemic cannot be safely brought into our  clinic. We have been using endoform on her wound areas. There are essentially 3 wound areas now in the left mid tibia now 2 open areas that it one point were connected and one on the right lateral ankle just above the malleolus. 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 She cannot come to clinic. The wounds were reviewed today via telehealth. She has severe chronic venous hypertension which I think is centrally mediated. She has wounds on her right anterior tibia and right lateral ankle area. These are chronic. She has been using endoform. 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 We have been following her from telehealth. She has severe chronic venous hypertension which may be mostly centrally mediated secondary to right heart heart failure. She has wounds on her right anterior tibia and right lateral ankle these are chronic we have been using endoform 6/22; TELEHEALTH; this patient was seen today via telehealth. 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 Since the last time we had contact with this patient she developed some pain and erythema around the wound on her right lateral malleolus/ankle and we put in antibiotics for her. This is resulted in good improvement with resolution of the erythema and tenderness. I changed her to silver alginate last time. We had been using endoform for an extended period of time 7/13; TELEHEALTH; this patient was seen today via telehealth. 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 She would continue to be at a prohibitive risk to  be brought into our clinic unless this was absolutely necessary. These visits have been done with her approval as well as her husband. We have been using silver alginate to the areas on the right mid tibia and right lateral lower leg. 7/27 TELEHEALTH; patient was seen along with her husband today via telehealth. 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 We changed her to sample last visit. She has 2 areas a chronic wound on her right mid tibia and one just above her ankle. These were not the original wounds when she came into this clinic but she developed them during treatment 8/17; she comes in for her first face-to-face visit in a long period. She has a remaining area just medial to  the right tibia which is the last open part of her large wound across this area. She also has an area on the right lateral lower leg. We prescribed Santyl last telehealth visit but they were concerned that this was making a deeper so they put silver alginate on it last week. Her husband changes the dressings. 8/31; using Santyl to the 2 wound areas some improvement in wound surfaces. Husband has surgery in 2 weeks we will put her out 3 weeks. Any of the advanced treatment options that I can think of that would be eligible for this wound would also cause her to have to come in weekly. The risk that the patient is just too high 9/21. Using Santyl to the 2 wound areas. Both of these are somewhat better although the medial mid tibia area still has exposed muscle. Lateral ankle requiring debridement. Using Santyl 10/12; using Santyl to the 2 wound areas. One on the right lateral ankle and the other in the medial calf which still has exposed muscle. Both areas have come down in size and have better looking surfaces. She has made nice progress with santyl 11/2; we have been using Santyl to the 2 wound areas. Right lateral ankle and the  other in the mid tibia area the medial part of this still has exposed muscle. 11/23/2019 on evaluation today patient appears to be doing about the same really with regard to her wounds. She is actually not very pleased with how things seem to be progressing at this point she tells me that she really has not noted much improvement unfortunately. With that being said there is no signs of active infection at this time. There is some slough buildup noted at this time which again along with some dry skin around the edges of the wound I think would benefit her to try to debride some of this away. Fortunately her pain is doing fairly well. She still has exposed muscle in the right medial/tibial area. 12/14; TELEHEALTH; she was changed to [facility] to the right calf wound and right lateral ankle wound when she was here last time. Unfortunately since then she had a fall with a pelvic fracture and a fracture of her wrist. She was apparently hospitalized for 5 days. I have not looked at her discharge summary. She apparently came out of the hospital with a blister on her right heel. She was seen today via telehealth by myself and our case manager. The patient and her husband were present. She has been using Hydrofera Blue at our direction from the last time she was in the clinic. There is been no major improvement in fact the areas appear deeper and with a less viable surface 01/04/2020; TELEHEALTH; the patient was seen today in accompaniment of her husband and our nurse. She has 3 open areas 1 on the right medial mid tibia, one on the right lateral ankle and a large eschared pressure area on the right heel. We have been using Iodoflex to the 2 original wounds. 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 It is simply too perilous for her to be seen in any other way 1/26; TELEHEALTH; the patient was seen today in accompaniment of her husband and our RN one of our nurses. She still  has the 3 open areas 1 on the right medial mid tibia which is the remanent of a more extensive wound in this area, one on the right lateral ankle and a large eschared pressure area on  the right heel. We have been using Iodoflex to the 2 original wounds and a bed at 9 application to the eschared area on the heel 2/15; the first time we have seen this patient and then several months out of concern for the pandemic. She had a large horizontal wound in the mid tibia. Only the medial aspect of this is still open. Area on the lateral ankle is just about closed. She had a new pressure ulcer on the right heel which I have removed some of the eschar. We have been using Iodoflex which I will continue. The area in the mid tibia has a round circle in the middle of exposed muscle. I think we would have to use an advanced treatment product to stimulate granulation over this area. We will run this through her insurance. She is not eligible for plastic surgery for many reasons 3/1; TELEHEALTH. The patient was seen today by telehealth. 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 We have been using Iodoflex to the wound areas which include the right anterior mid tibia, right lateral ankle and the right heel. All of these were reviewed 3/8; the patient's wound just above the right ankle is closed. She still has a contracting black eschar on the heel where she had a pressure ulcer. The medial part of her original wound on the mid tibia has exposed muscle. We have made really made no progress in this area although we have managed to get a lot of the original wound in this area to close. We used Apligraf #1 today 3/22; the patient's ankle wound remains closed. She still has a contracting black eschar on the heel although it seems to have less surface area. It still not clear whether there is any depth here. We used Apligraf #2  today in an attempt to get granulation over the exposed muscle and what is left of her mid tibia area. 4/5; everything is closed except the medial aspect of the mid tibia wound, as well as the pressure injury on the right heel. She has been using Betadine to the right heel which has been gradually contracting. We used Apligraf #3 today. 4/19; Apligraf #4. Still a pressure area on the right heel she has been using Betadine superficial excoriated areas she has been applying salicylic acid to on the anterior leg and the thick horn on the leg just above her wound area 5/3; Apligraf #5. Unfortunately she came in today with a reopening of the area on the right lateral lower leg. Not much change in the wound we have been treating in the mid calf. Quite a bit of edema surrounding this wound. She reports that she was up on her feet quite a bit. The area on the right heel is separating she has been using Betadine She comes in with a new wound on the left lateral malleolus which is very disappointing this is been open since last week 5/17; we applied her last Apligraf last time. Unfortunately that did not have any effect on the deep area medially in the mid tibial area. However the area over the right lateral malleolus is a lot better. Right heel also is contracted we used Iodoflex last time. The new wound on the left lateral malleolus were using Santyl to. This required debridement. 6/1. Not much change in the area on the right medial mid tibia. Right lateral ankle is improved she has the necrotic area  on the right heel which was a pressure area. New area on the left lateral malleolus from last time. I changed her to Iodoflex to the area on the left lateral malleolus she said this hurt I have put her back on Santyl 6/15; right mid tibia is unchanged. I do not think there is anything I can do to this topically that we will get this to granulate over the muscle. And I am furthermore I am not sure that she is a  surgical candidate either because of her severe pulmonary issues or because she really does not want to go through it. ooThe area on the right lateral malleolus is a small punched out area. ooThe area on the left lateral malleolus again small punched out with nonviable surface ooPressure ulcer on the right heel at separating black eschar. I went ahead and remove this today Lura Em has an area on the left anterior mid tibia just laterally. Geographic wounds debris on the surface. Some erythema here. 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 There is skin changes widely Change the primary dressing to Sorbact to all wound areas. She needs compression on the left leg as well as the right Objective Constitutional Patient is hypertensive.. Pulse regular and within target range for patient.Marland Kitchen Respirations regular, non-labored and within target range.. Temperature is normal and within the target range for the patient.Marland Kitchen Appears in no distress. Vitals Time Taken: 11:45 AM, Height: 68 in, Source: Stated, Weight: 132 lbs, Source: Stated, BMI: 20.1, Temperature: 97.7 F, Pulse: 74 bpm, Respiratory Rate: 18 breaths/min, Blood Pressure: 167/72 mmHg. General Notes: Wound exam; ooMidportion of the right tibia absolutely no change still exposed muscle ooRight lateral malleolus is a small wound this is improved in terms of dimensions although it is small and punched out ooRight plantar heel which was initial pressure ulcer. Necrotic debris separating. I removed this with a #3 curette and pickups. Removed some subcutaneous debris as well ooOn the left the left lateral malleolus about the same necrotic debris small punched out area. ooShe has a worrisome area on the left anterior lateral lower leg mid aspect. Geographic wounds with surface debris surrounding erythema which is nontender I suspect this is chronic venous stasis Integumentary (Hair, Skin) Wound #16 status is Open. Original  cause of wound was Pressure Injury. The wound is located on the Right Calcaneus. The wound measures 1.3cm length x 1.6cm width x 0.7cm depth; 1.634cm^2 area and 1.144cm^3 volume. There is Fat Layer (Subcutaneous Tissue) Exposed exposed. There is no tunneling or undermining noted. There is a small amount of serosanguineous drainage noted. The wound margin is distinct with the outline attached to the wound base. There is no granulation within the wound bed. There is a large (67-100%) amount of necrotic tissue within the wound bed including Eschar and Adherent Slough. Wound #19 status is Open. Original cause of wound was Gradually Appeared. The wound is located on the Right,Lateral Malleolus. The wound measures 0.3cm length x 0.2cm width x 0.2cm depth; 0.047cm^2 area and 0.009cm^3 volume. There is Fat Layer (Subcutaneous Tissue) Exposed exposed. There is no tunneling or undermining noted. There is a small amount of serous drainage noted. The wound margin is distinct with the outline attached to the wound base. There is no granulation within the wound bed. There is a large (67-100%) amount of necrotic tissue within the wound bed including Adherent Slough. Wound #20 status is Open. Original cause of wound was Gradually Appeared. The wound is located on the Left,Lateral  Malleolus. The wound measures 0.8cm length x 0.7cm width x 0.2cm depth; 0.44cm^2 area and 0.088cm^3 volume. There is Fat Layer (Subcutaneous Tissue) Exposed exposed. There is no tunneling or undermining noted. There is a small amount of serous drainage noted. The wound margin is flat and intact. There is no granulation within the wound bed. There is a large (67-100%) amount of necrotic tissue within the wound bed including Adherent Slough. Wound #21 status is Open. Original cause of wound was Gradually Appeared. The wound is located on the Left,Lateral Lower Leg. The wound measures 4.5cm length x 0.5cm width x 0.1cm depth; 1.767cm^2 area and  0.177cm^3 volume. There is Fat Layer (Subcutaneous Tissue) Exposed exposed. There is no tunneling or undermining noted. There is a small amount of serous drainage noted. The wound margin is flat and intact. There is no granulation within the wound bed. There is a large (67-100%) amount of necrotic tissue within the wound bed including Adherent Slough. Wound #5 status is Open. Original cause of wound was Gradually Appeared. The wound is located on the Right,Medial Lower Leg. The wound measures 2.3cm length x 1.7cm width x 0.3cm depth; 3.071cm^2 area and 0.921cm^3 volume. There is Fat Layer (Subcutaneous Tissue) Exposed exposed. There is no tunneling or undermining noted. There is a medium amount of serosanguineous drainage noted. The wound margin is flat and intact. There is small (1-33%) pink granulation within the wound bed. There is a large (67-100%) amount of necrotic tissue within the wound bed including Adherent Slough. 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 There was a Excisional Skin/Subcutaneous Tissue Debridement with a total area of 2.08 sq cm performed by Ricard Dillon., MD. With the following instrument(s): Curette to remove Viable and Non-Viable tissue/material. Material removed includes Subcutaneous Tissue, Slough, Skin: Dermis, and Skin:  Epidermis. No specimens were taken. A time out was conducted at 12:24, prior to the start of the procedure. A Moderate amount of bleeding was controlled with Pressure. The procedure was tolerated well with a pain level of 0 throughout and a pain level of 0 following the procedure. Post Debridement Measurements: 1.3cm length x 1.6cm width x 0.7cm depth; 1.144cm^3 volume. Post debridement Stage noted as Unstageable/Unclassified. Character of Wound/Ulcer Post Debridement is improved. Post procedure Diagnosis Wound #16: Same as Pre-Procedure Plan Follow-up Appointments: Return Appointment in 1 week. Dressing Change Frequency: Do not change entire dressing for one week. Skin Barriers/Peri-Wound Care: Moisturizing lotion - both legs Wound Cleansing: May shower with protection. Primary Wound Dressing: Wound #16 Right Calcaneus: Cutimed Sorbact Wound #19 Right,Lateral Malleolus: Cutimed Sorbact Wound #20 Left,Lateral Malleolus: Cutimed Sorbact Wound #21 Left,Lateral Lower Leg: Cutimed Sorbact Wound #5 Right,Medial Lower Leg: Cutimed Sorbact Secondary Dressing: Wound #16 Right Calcaneus: Dry Gauze Heel Cup Wound #19 Right,Lateral Malleolus: Dry Gauze Wound #20 Left,Lateral Malleolus: Dry Gauze Wound #21 Left,Lateral Lower Leg: Dry Gauze Wound #5 Right,Medial Lower Leg: Dry Gauze Edema Control: 3 Layer Compression System - Bilateral  Avoid standing for long periods of time Elevate legs to the level of the heart or above for 30 minutes daily and/or when sitting, a frequency of: - throughout the day Off-Loading: Turn and reposition every 2 hours Other: - float heels off of bed/chair with pillow under calves 1. Sorbact to all wound areas 2. I did bring up referring her to plastic surgery will bring this up again when I see her next. I do not know that she would be an operative candidate and I do not know she would agree to it in any case 3. The new area on the left anterior mid  tibia is worrisome. I had to put this leg in compression. She has chronic venous hypertension which is severe and secondary lymphedema. The erythema was nontender will likely chronic stasis dermatitis Electronic Signature(s) Signed: 06/14/2020 5:55:30 PM By: Linton Ham MD Entered By: Linton Ham on 06/14/2020 13:04:22 -------------------------------------------------------------------------------- SuperBill Details Patient Name: Date of Service: [name], Yolanda Manges. 06/14/2020 Medical Record Number: [id number] Patient Account Number: [id number] Date of Birth/Sex: Treating RN: Oct 03, 1951 (69 y.o. 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 I87.331 Chronic venous hypertension (idiopathic) with ulcer and inflammation of right lower extremity L89.616 Pressure-induced deep tissue damage of right heel L97.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 L97.828 Non-pressure chronic ulcer of other part of left lower leg with other specified severity Facility Procedures CPT4 Code: [id number] Description: 23009 - DEB SUBQ TISSUE 20 SQ CM/< ICD-10 Diagnosis Description L89.616 Pressure-induced deep tissue damage of right heel Modifier: Quantity: 1 Physician Procedures Electronic Signature(s) Signed: 06/14/2020 5:55:30 PM By: Linton Ham MD Entered By: Linton Ham on 06/14/2020 13:04:52

## 2020-06-16 NOTE — Progress Notes (Signed)
[name], [name] ([id number]) Visit Report for 06/14/2020 Arrival Information Details Patient Name: Date of Service: [name] [name] 06/14/2020 11:00 A M Medical Record Number: [id number] Patient Account Number: [id number] Date of Birth/Sex: Treating RN: 1951/02/17 (69 y.o. [name] [name] Primary Care Miya Luviano: [name], [place] RY Other Clinician: Referring Rockne Dearinger: Treating Rorey Bisson/Extender: [name], MA RY Weeks in Treatment: 1 Visit Information History Since Last Visit Added or deleted any medications: No Patient Arrived: Ambulatory Any new allergies or adverse reactions: No Arrival Time: 11:43 Had a fall or experienced change in No Accompanied By: spouse activities of daily living that may affect Transfer Assistance: None risk of falls: Patient Identification Verified: Yes Signs or symptoms of abuse/neglect since last visito No Secondary Verification Process Completed: Yes Hospitalized since last visit: No Patient Requires Transmission-Based Precautions: No Implantable device outside of the clinic excluding No Patient Has Alerts: No cellular tissue based products placed in the center since last visit: Has Dressing in Place as Prescribed: Yes Has Compression in Place as Prescribed: Yes Pain Present Now: No Electronic Signature(s) Signed: 06/16/2020 12:45:44 PM By: [name] [name] Entered By: [name] [name] on 06/14/2020 11:44:40 -------------------------------------------------------------------------------- Encounter Discharge Information Details Patient Name: Date of Service: [name] [name], [name] 06/14/2020 11:00 A M Medical Record Number: [id number] Patient Account Number: [id number] Date of Birth/Sex: Treating RN: 1951-08-28 (69 y.o. [name] [name] Primary Care Nickolaus Bordelon: [name], [place] RY Other Clinician: Referring Samyia Motter: Treating Orbie Grupe/Extender: [name], MA RY Weeks in Treatment: [id number] Encounter Discharge Information Items  Post Procedure Vitals Discharge Condition: Stable Temperature (F): 97.7 Ambulatory Status: Ambulatory Pulse (bpm): 74 Discharge Destination: Home Respiratory Rate (breaths/min): 18 Transportation: Private Auto Blood Pressure (mmHg): 167/72 Accompanied By: husband Schedule Follow-up Appointment: Yes Clinical Summary of Care: Patient Declined Electronic Signature(s) Signed: 06/14/2020 5:39:21 PM By: Kela Millin Entered By: Kela Millin on 06/14/2020 13:17:49 -------------------------------------------------------------------------------- Lower Extremity Assessment Details Patient Name: Date of Service: [name] [name]. 06/14/2020 11:00 A M Medical Record Number: [id number] Patient Account Number: [id number] Date of Birth/Sex: Treating RN: 04-27-1951 (69 y.o. [name] [name] Primary Care Tyasia Packard: [name], [place] RY Other Clinician: Referring Traxton Kolenda: Treating Khylee Algeo/Extender: [name], MA RY Weeks in Treatment: 429 Edema Assessment Assessed: [Left: No] [Right: No] Edema: [Left: Yes] [Right: Yes] Calf Left: Right: Point of Measurement: 36 cm From Medial Instep 35.2 cm 31.4 cm Ankle Left: Right: Point of Measurement: 10 cm From Medial Instep 21.3 cm 21.2 cm Vascular Assessment Pulses: Dorsalis Pedis Palpable: [Left:No] [Right:No] Electronic Signature(s) Signed: 06/14/2020 5:45:30 PM By: Baruch Gouty RN, BSN Entered By: Baruch Gouty on 06/14/2020 11:50:15 -------------------------------------------------------------------------------- Multi Wound Chart Details Patient Name: Date of Service: [name] [name] 06/14/2020 11:00 A M Medical Record Number: [id number] Patient Account Number: [id number] Date of Birth/Sex: Treating RN: 06-Oct-1951 (69 y.o. [name] [name] Primary Care Rajni Holsworth: [name], [place] RY Other Clinician: Referring Sheanna Dail: Treating Shantay Sonn/Extender: [name], MA RY Weeks in Treatment: 429 Vital  Signs Height(in): 68 Pulse(bpm): 47 Weight(lbs): 132 Blood Pressure(mmHg): 167/72 Body Mass Index(BMI): 20 Temperature(F): 97.7 Respiratory Rate(breaths/min): 18 Photos: [16:No Photos Right Calcaneus] [19:No Photos Right, Lateral Malleolus] [20:No Photos Left, Lateral Malleolus] Wound Location: [16:Pressure Injury] [19:Gradually Appeared] [20:Gradually Appeared] Wounding Event: [16:Pressure Ulcer] [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] Primary Etiology: [16:Anemia, Hypertension, Peripheral] [19:Anemia, Hypertension, Peripheral] [20:Anemia, Hypertension, Peripheral] Comorbid History: [16:Arterial Disease, Peripheral Venous Disease, Raynauds, Scleroderma, Rheumatoid Arthritis, Osteoarthritis  01/04/2020] [19:Arterial Disease, Peripheral Venous Disease, Raynauds, Scleroderma, Rheumatoid Arthritis, Osteoarthritis  05/02/2020] [20:Arterial Disease, Peripheral Venous Disease, Raynauds, Scleroderma, Rheumatoid Arthritis, Osteoarthritis 05/02/2020] Date Acquired: [16:23] [19:6] [20:6] Weeks of Treatment: [16:Open] [19:Open] [20:Open] Wound Status: [16:No] [19:No] [20:No] Clustered Wound: [16:N/A] [19:N/A] [20:N/A] Clustered Quantity: [16:1.3x1.6x0.7] [19:0.3x0.2x0.2] [20:0.8x0.7x0.2] Measurements L x W x D (cm) [16:1.634] [19:0.047] [20:0.44] A (cm) : rea [16:1.144] [19:0.009] [20:0.088] Volume (cm) : [16:87.00%] [19:66.70%] [20:0.00%] % Reduction in Area: [16:9.00%] [19:67.90%] [20:0.00%] % Reduction in Volume: [16:Unstageable/Unclassified] [19:Full Thickness Without Exposed] [20:Unclassifiable] Classification: [16:Small] [19:Support Structures Small] [20:Small] Exudate A mount: [16:Serosanguineous] [19:Serous] [20:Serous] Exudate Type: [16:red, brown] [19:amber] [20:amber] Exudate Color: [16:Distinct, outline attached] [19:Distinct, outline attached] [20:Flat and Intact] Wound Margin: [16:None Present (0%)] [19:None Present (0%)] [20:None Present (0%)] Granulation Amount:  [16:N/A] [19:N/A] [20:N/A] Granulation Quality: [16:Large (67-100%)] [19:Large (67-100%)] [20:Large (67-100%)] Necrotic Amount: [16:Eschar, Adherent Slough] [19:Adherent Slough] [20:Adherent Slough] Necrotic Tissue: [16:Fat Layer (Subcutaneous Tissue)] [19:Fat Layer (Subcutaneous Tissue)] [20:Fat Layer (Subcutaneous Tissue)] Exposed Structures: [16:Exposed: Yes Fascia: No Tendon: No Muscle: No Joint: No Bone: No None] [19:Exposed: Yes Fascia: No Tendon: No Muscle: No Joint: No Bone: No Small (1-33%)] [20:Exposed: Yes Fascia: No Tendon: No Muscle: No Joint: No Bone: No None] Epithelialization: [16:Debridement - Excisional] [19:N/A] [20:N/A] Debridement: Pre-procedure Verification/Time Out 12:24 [19:N/A] [20:N/A] Taken: [16:Subcutaneous, Slough] [19:N/A] [20:N/A] Tissue Debrided: [16:Skin/Subcutaneous Tissue] [19:N/A] [20:N/A] Level: [16:2.08] [19:N/A] [20:N/A] Debridement A (sq cm): [16:rea Curette] [19:N/A] [20:N/A] Instrument: [16:Moderate] [19:N/A] [20:N/A] Bleeding: [16:Pressure] [19:N/A] [20:N/A] Hemostasis A chieved: [16:0] [19:N/A] [20:N/A] Procedural Pain: [16:0] [19:N/A] [20:N/A] Post Procedural Pain: [16:Procedure was tolerated well] [19:N/A] [20:N/A] Debridement Treatment Response: [16:1.3x1.6x0.7] [19:N/A] [20:N/A] Post Debridement Measurements L x W x D (cm) [16:1.144] [19:N/A] [20:N/A] Post Debridement Volume: (cm) [16:Unstageable/Unclassified] [19:N/A] [20:N/A] Post Debridement Stage: [16:Debridement] [19:N/A] [20:N/A] Wound Number: 21 5 N/A Photos: No Photos No Photos N/A Left, Lateral Lower Leg Right, Medial Lower Leg N/A Wound Location: Gradually Appeared Gradually Appeared N/A Wounding Event: Venous Leg Ulcer Venous Leg Ulcer N/A Primary Etiology: Anemia, Hypertension, Peripheral Anemia, Hypertension, Peripheral N/A Comorbid History: Arterial Disease, Peripheral Venous Arterial Disease, Peripheral Venous Disease, Raynauds, Scleroderma, Disease, Raynauds,  Scleroderma, Rheumatoid Arthritis, Osteoarthritis Rheumatoid Arthritis, Osteoarthritis 06/14/2020 01/13/2013 N/A Date Acquired: 0 385 N/A Weeks of Treatment: Open Open N/A Wound Status: Yes Yes N/A Clustered Wound: 3 N/A N/A Clustered Quantity: 4.5x0.5x0.1 2.3x1.7x0.3 N/A Measurements L x W x D (cm) 1.767 3.071 N/A A (cm) : rea 0.177 0.921 N/A Volume (cm) : N/A -117.20% N/A % Reduction in Area: N/A -553.20% N/A % Reduction in Volume: Full Thickness Without Exposed Full Thickness With Exposed Support N/A Classification: Support Structures Structures Small Medium N/A Exudate A mount: Serous Serosanguineous N/A Exudate Type: amber red, brown N/A Exudate Color: Flat and Intact Flat and Intact N/A Wound Margin: None Present (0%) Small (1-33%) N/A Granulation Amount: N/A Pink N/A Granulation Quality: Large (67-100%) Large (67-100%) N/A Necrotic Amount: Adherent Slough Adherent Slough N/A Necrotic Tissue: Fat Layer (Subcutaneous Tissue) Fat Layer (Subcutaneous Tissue) N/A Exposed Structures: Exposed: Yes Exposed: Yes Fascia: No Fascia: No Tendon: No Tendon: No Muscle: No Muscle: No Joint: No Joint: No Bone: No Bone: No Small (1-33%) Small (1-33%) N/A Epithelialization: N/A N/A N/A Debridement: N/A N/A N/A Tissue Debrided: N/A N/A N/A Level: N/A N/A N/A Debridement A (sq cm): rea N/A N/A N/A Instrument: N/A N/A N/A Bleeding: N/A N/A N/A Hemostasis A chieved: N/A N/A N/A Procedural Pain: N/A N/A N/A Post Procedural Pain: N/A N/A N/A Debridement Treatment Response: N/A N/A N/A Post Debridement  Measurements L x W x D (cm) N/A N/A N/A Post Debridement Volume: (cm) N/A N/A N/A Post Debridement Stage: N/A N/A N/A Procedures Performed: Treatment Notes Electronic Signature(s) Signed: 06/14/2020 5:55:30 PM By: Linton Ham MD Signed: 06/15/2020 9:54:21 AM By: Carlene Coria RN Entered By: Linton Ham on 06/14/2020  12:57:16 -------------------------------------------------------------------------------- Multi-Disciplinary Care Plan Details Patient Name: Date of Service: [name] [name], [name] 06/14/2020 11:00 A M Medical Record Number: [id number] Patient Account Number: [id number] Date of Birth/Sex: Treating RN: 09/26/1951 (69 y.o. [name] [name] Primary Care Jozef Eisenbeis: [name], [place] RY Other Clinician: Referring Gillie Crisci: Treating Robet Crutchfield/Extender: [name], MA RY Weeks in Treatment: 429 Active Inactive Wound/Skin Impairment Nursing Diagnoses: Impaired tissue integrity Knowledge deficit related to ulceration/compromised skin integrity Goals: Patient/caregiver will verbalize understanding of skin care regimen Date Initiated: 10/11/2016 Target Resolution Date: 07/04/2020 Goal Status: Active Interventions: Assess patient/caregiver ability to obtain necessary supplies Assess patient/caregiver ability to perform ulcer/skin care regimen upon admission and as needed Assess ulceration(s) every visit Provide education on ulcer and skin care Treatment Activities: Skin care regimen initiated : 10/11/2016 Topical wound management initiated : 10/11/2016 Notes: Electronic Signature(s) Signed: 06/15/2020 9:54:21 AM By: Carlene Coria RN Entered By: Carlene Coria on 06/14/2020 12:14:02 -------------------------------------------------------------------------------- Pain Assessment Details Patient Name: Date of Service: [name] LO [name] Hora F. 06/14/2020 11:00 A M Medical Record Number: [id number] Patient Account Number: [id number] Date of Birth/Sex: Treating RN: [birth year] (69 y.o. [name] [name] Primary Care Donal Lynam: [name], [place] RY Other Clinician: Referring Owen Pratte: Treating Annelisa Ryback/Extender: [name], MA RY Weeks in Treatment: 429 Active Problems Location of Pain Severity and Description of Pain Patient Has Paino No Site Locations Rate the pain. Current Pain  Level: 0 Pain Management and Medication Current Pain Management: Electronic Signature(s) Signed: 06/14/2020 5:45:30 PM By: Baruch Gouty RN, BSN Entered By: Baruch Gouty on 06/14/2020 11:46:11 -------------------------------------------------------------------------------- Patient/Caregiver Education Details Patient Name: Date of Service: [name] [name], [name] [name] 6/15/2021andnbsp11:00 A M Medical Record Number: [id number] Patient Account Number: [id number] Date of Birth/Gender: Treating RN: October 22, 1951 (69 y.o. [name] [name] Primary Care Physician: [name], [place] RY Other Clinician: Referring Physician: Treating Physician/Extender: [name], MA RY Weeks in Treatment: [id number] Education Assessment Education Provided To: Patient Education Topics Provided Wound/Skin Impairment: Methods: Explain/Verbal Responses: State content correctly Electronic Signature(s) Signed: 06/15/2020 9:54:21 AM By: Carlene Coria RN Entered By: Carlene Coria on 06/14/2020 12:14:25 -------------------------------------------------------------------------------- Wound Assessment Details Patient Name: Date of Service: [name] LO [name] Hora F. 06/14/2020 11:00 A M Medical Record Number: [id number] Patient Account Number: [id number] Date of Birth/Sex: Treating RN: [birth year] (69 y.o. [name] [name] Primary Care Dionicia Cerritos: [name], [place] RY Other Clinician: Referring Suhaylah Wampole: Treating Jabarie Pop/Extender: [name], MA RY Weeks in Treatment: 429 Wound Status Wound Number: 16 Primary Pressure Ulcer Etiology: Wound Location: Right Calcaneus Wound Open Wounding Event: Pressure Injury Status: Date Acquired: 01/04/2020 Comorbid Anemia, Hypertension, Peripheral Arterial Disease, Peripheral Weeks Of Treatment: 23 History: Venous Disease, Raynauds, Scleroderma, Rheumatoid Arthritis, Clustered Wound: No Osteoarthritis Photos Photo Uploaded By: Mikeal Hawthorne on 06/16/2020 10:45:08 Wound  Measurements Length: (cm) 1.3 Width: (cm) 1.6 Depth: (cm) 0.7 Area: (cm) 1.634 Volume: (cm) 1.144 % Reduction in Area: 87% % Reduction in Volume: 9% Epithelialization: None Tunneling: No Undermining: No Wound Description Classification: Unstageable/Unclassified Wound Margin: Distinct, outline attached Exudate Amount: Small Exudate Type: Serosanguineous Exudate Color: red, brown Foul Odor After Cleansing: No Slough/Fibrino Yes Wound Bed Granulation  Amount: None Present (0%) Exposed Structure Necrotic Amount: Large (67-100%) Fascia Exposed: No Necrotic Quality: Eschar, Adherent Slough Fat Layer (Subcutaneous Tissue) Exposed: Yes Tendon Exposed: No Muscle Exposed: No Joint Exposed: No Bone Exposed: No Treatment Notes Wound #16 (Right Calcaneus) 1. Cleanse With Wound Cleanser Soap and water 2. Periwound Care Moisturizing lotion 3. Primary Dressing Applied Other primary dressing (specifiy in notes) 4. Secondary Dressing Dry Gauze 6. Support Layer Applied 3 layer compression wrap Notes sorbact, moisten with hydrogel Electronic Signature(s) Signed: 06/14/2020 5:45:30 PM By: Baruch Gouty RN, BSN Entered By: Baruch Gouty on 06/14/2020 11:57:55 -------------------------------------------------------------------------------- Wound Assessment Details Patient Name: Date of Service: [name] [name], [name] 06/14/2020 11:00 A M Medical Record Number: [id number] Patient Account Number: [id number] Date of Birth/Sex: Treating RN: [birth year] (69 y.o. [name] [name] Primary Care Dashea Mcmullan: [name], [place] RY Other Clinician: Referring Teiana Hajduk: Treating Calla Wedekind/Extender: [name], MA RY Weeks in Treatment: 429 Wound Status Wound Number: 19 Primary Venous Leg Ulcer Etiology: Wound Location: Right, Lateral Malleolus Wound Open Wounding Event: Gradually Appeared Status: Date Acquired: 05/02/2020 Comorbid Anemia, Hypertension, Peripheral Arterial Disease,  Peripheral Weeks Of Treatment: 6 History: Venous Disease, Raynauds, Scleroderma, Rheumatoid Arthritis, Clustered Wound: No Osteoarthritis Photos Photo Uploaded By: Mikeal Hawthorne on 06/16/2020 10:42:46 Wound Measurements Length: (cm) 0.3 Width: (cm) 0.2 Depth: (cm) 0.2 Area: (cm) 0.047 Volume: (cm) 0.009 % Reduction in Area: 66.7% % Reduction in Volume: 67.9% Epithelialization: Small (1-33%) Tunneling: No Undermining: No Wound Description Classification: Full Thickness Without Exposed Support Structures Wound Margin: Distinct, outline attached Exudate Amount: Small Exudate Type: Serous Exudate Color: amber Foul Odor After Cleansing: No Slough/Fibrino Yes Wound Bed Granulation Amount: None Present (0%) Exposed Structure Necrotic Amount: Large (67-100%) Fascia Exposed: No Necrotic Quality: Adherent Slough Fat Layer (Subcutaneous Tissue) Exposed: Yes Tendon Exposed: No Muscle Exposed: No Joint Exposed: No Bone Exposed: No Treatment Notes Wound #19 (Right, Lateral Malleolus) 1. Cleanse With Wound Cleanser Soap and water 2. Periwound Care Moisturizing lotion 3. Primary Dressing Applied Other primary dressing (specifiy in notes) 4. Secondary Dressing Dry Gauze 6. Support Layer Applied 3 layer compression wrap Notes sorbact, moisten with hydrogel Electronic Signature(s) Signed: 06/14/2020 5:45:30 PM By: Baruch Gouty RN, BSN Entered By: Baruch Gouty on 06/14/2020 11:58:46 -------------------------------------------------------------------------------- Wound Assessment Details Patient Name: Date of Service: [name] [name], [name] 06/14/2020 11:00 A M Medical Record Number: [id number] Patient Account Number: [id number] Date of Birth/Sex: Treating RN: 1951/03/05 (69 y.o. [name] [name] Primary Care Ketih Goodie: [name], [place] RY Other Clinician: Referring Tremeka Helbling: Treating Lillieann Pavlich/Extender: [name], MA RY Weeks in Treatment: 429 Wound  Status Wound Number: 20 Primary Arterial Insufficiency Ulcer Etiology: Wound Location: Left, Lateral Malleolus Wound Open Wounding Event: Gradually Appeared Status: Date Acquired: 05/02/2020 Comorbid Anemia, Hypertension, Peripheral Arterial Disease, Peripheral Weeks Of Treatment: 6 History: Venous Disease, Raynauds, Scleroderma, Rheumatoid Arthritis, Clustered Wound: No Osteoarthritis Photos Photo Uploaded By: Mikeal Hawthorne on 06/16/2020 10:42:32 Wound Measurements Length: (cm) 0.8 Width: (cm) 0.7 Depth: (cm) 0.2 Area: (cm) 0.44 Volume: (cm) 0.088 Wound Description Classification: Unclassifiable Wound Margin: Flat and Intact Exudate Amount: Small Exudate Type: Serous Exudate Color: amber Foul Odor After Cleansing: N Slough/Fibrino Y % Reduction in Area: 0% % Reduction in Volume: 0% Epithelialization: None Tunneling: No Undermining: No o es Wound Bed Granulation Amount: None Present (0%) Exposed Structure Necrotic Amount: Large (67-100%) Fascia Exposed: No Necrotic Quality: Adherent Slough Fat Layer (Subcutaneous Tissue) Exposed: Yes Tendon Exposed: No Muscle Exposed: No Joint  Exposed: No Bone Exposed: No Treatment Notes Wound #20 (Left, Lateral Malleolus) 1. Cleanse With Wound Cleanser Soap and water 2. Periwound Care Moisturizing lotion 3. Primary Dressing Applied Other primary dressing (specifiy in notes) 4. Secondary Dressing Dry Gauze 6. Support Layer Applied 3 layer compression wrap Notes sorbact, moisten with hydrogel Electronic Signature(s) Signed: 06/14/2020 5:45:30 PM By: Baruch Gouty RN, BSN Entered By: Baruch Gouty on 06/14/2020 11:59:28 -------------------------------------------------------------------------------- Wound Assessment Details Patient Name: Date of Service: [name] [name], [name] 06/14/2020 11:00 A M Medical Record Number: [id number] Patient Account Number: [id number] Date of Birth/Sex: Treating RN: 1951-09-07 (69 y.o.  [name] [name] Primary Care Elion Hocker: [name], [place] RY Other Clinician: Referring Lise Pincus: Treating Reannon Candella/Extender: [name], MA RY Weeks in Treatment: 429 Wound Status Wound Number: 21 Primary Venous Leg Ulcer Etiology: Wound Location: Left, Lateral Lower Leg Wound Open Wounding Event: Gradually Appeared Status: Date Acquired: 06/14/2020 Comorbid Anemia, Hypertension, Peripheral Arterial Disease, Peripheral Weeks Of Treatment: 0 History: Venous Disease, Raynauds, Scleroderma, Rheumatoid Arthritis, Clustered Wound: Yes Osteoarthritis Photos Photo Uploaded By: Mikeal Hawthorne on 06/16/2020 10:45:47 Wound Measurements Length: (cm) 4.5 Width: (cm) 0.5 Depth: (cm) 0.1 Clustered Quantity: 3 Area: (cm) 1.767 Volume: (cm) 0.177 % Reduction in Area: % Reduction in Volume: Epithelialization: Small (1-33%) Tunneling: No Undermining: No Wound Description Classification: Full Thickness Without Exposed Support Structures Wound Margin: Flat and Intact Exudate Amount: Small Exudate Type: Serous Exudate Color: amber Foul Odor After Cleansing: No Slough/Fibrino Yes Wound Bed Granulation Amount: None Present (0%) Exposed Structure Necrotic Amount: Large (67-100%) Fascia Exposed: No Necrotic Quality: Adherent Slough Fat Layer (Subcutaneous Tissue) Exposed: Yes Tendon Exposed: No Muscle Exposed: No Joint Exposed: No Bone Exposed: No Treatment Notes Wound #21 (Left, Lateral Lower Leg) 1. Cleanse With Wound Cleanser Soap and water 2. Periwound Care Moisturizing lotion 3. Primary Dressing Applied Other primary dressing (specifiy in notes) 4. Secondary Dressing Dry Gauze 6. Support Layer Applied 3 layer compression wrap Notes sorbact, moisten with hydrogel Electronic Signature(s) Signed: 06/14/2020 5:45:30 PM By: Baruch Gouty RN, BSN Entered By: Baruch Gouty on 06/14/2020  12:02:43 -------------------------------------------------------------------------------- Wound Assessment Details Patient Name: Date of Service: [name] [name], [name] 06/14/2020 11:00 A M Medical Record Number: [id number] Patient Account Number: [id number] Date of Birth/Sex: Treating RN: 1951/02/01 (69 y.o. [name] [name] Primary Care Raima Geathers: Other Clinician: [name], [place] RY Referring Amaiyah Nordhoff: Treating Jebediah Macrae/Extender: [name], MA RY Weeks in Treatment: 429 Wound Status Wound Number: 5 Primary Venous Leg Ulcer Etiology: Wound Location: Right, Medial Lower Leg Wound Open Wounding Event: Gradually Appeared Status: Date Acquired: 01/13/2013 Comorbid Anemia, Hypertension, Peripheral Arterial Disease, Peripheral Weeks Of Treatment: 385 History: Venous Disease, Raynauds, Scleroderma, Rheumatoid Arthritis, Clustered Wound: Yes Osteoarthritis Photos Photo Uploaded By: Mikeal Hawthorne on 06/16/2020 10:45:08 Wound Measurements Length: (cm) 2.3 Width: (cm) 1.7 Depth: (cm) 0.3 Area: (cm) 3.071 Volume: (cm) 0.921 % Reduction in Area: -117.2% % Reduction in Volume: -553.2% Epithelialization: Small (1-33%) Tunneling: No Undermining: No Wound Description Classification: Full Thickness With Exposed Support Structures Wound Margin: Flat and Intact Exudate Amount: Medium Exudate Type: Serosanguineous Exudate Color: red, brown Foul Odor After Cleansing: No Slough/Fibrino Yes Wound Bed Granulation Amount: Small (1-33%) Exposed Structure Granulation Quality: Pink Fascia Exposed: No Necrotic Amount: Large (67-100%) Fat Layer (Subcutaneous Tissue) Exposed: Yes Necrotic Quality: Adherent Slough Tendon Exposed: No Muscle Exposed: No Joint Exposed: No Bone Exposed: No Treatment Notes Wound #5 (Right, Medial Lower Leg) 1. Cleanse With Wound Cleanser Soap  and water 2. Periwound Care Moisturizing lotion 3. Primary Dressing Applied Other primary dressing  (specifiy in notes) 4. Secondary Dressing Dry Gauze 6. Support Layer Applied 3 layer compression wrap Notes sorbact, moisten with hydrogel Electronic Signature(s) Signed: 06/14/2020 5:45:30 PM By: Baruch Gouty RN, BSN Signed: 06/14/2020 5:45:30 PM By: Baruch Gouty RN, BSN Entered By: Baruch Gouty on 06/14/2020 12:00:37 -------------------------------------------------------------------------------- Vitals Details Patient Name: Date of Service: [name] [name], [name] 06/14/2020 11:00 A M Medical Record Number: [id number] Patient Account Number: [id number] Date of Birth/Sex: Treating RN: January 23, 1951 (69 y.o. [name] [name] Primary Care Isobella Ascher: [name], [place] RY Other Clinician: Referring Eyva Califano: Treating Tamlyn Sides/Extender: [name], MA RY Weeks in Treatment: 429 Vital Signs Time Taken: 11:45 Temperature (F): 97.7 Height (in): 68 Pulse (bpm): 74 Source: Stated Respiratory Rate (breaths/min): 18 Weight (lbs): 132 Blood Pressure (mmHg): 167/72 Source: Stated Reference Range: 80 - 120 mg / dl Body Mass Index (BMI): 20.1 Electronic Signature(s) Signed: 06/14/2020 5:45:30 PM By: Baruch Gouty RN, BSN Entered By: Baruch Gouty on 06/14/2020 11:46:01

## 2020-06-20 ENCOUNTER — Other Ambulatory Visit (HOSPITAL_COMMUNITY)
Admission: RE | Admit: 2020-06-20 | Discharge: 2020-06-20 | Disposition: A | Discharge status: Home / Self Care | Payer: Medicare Other | Source: Other Acute Inpatient Hospital | Attending: Internal Medicine | Admitting: Internal Medicine

## 2020-06-20 ENCOUNTER — Encounter (HOSPITAL_BASED_OUTPATIENT_CLINIC_OR_DEPARTMENT_OTHER): Payer: Medicare Other | Admitting: Internal Medicine

## 2020-06-20 DIAGNOSIS — L89616 Pressure-induced deep tissue damage of right heel: Secondary | ICD-10-CM | POA: Diagnosis present

## 2020-06-20 DIAGNOSIS — I87331 Chronic venous hypertension (idiopathic) with ulcer and inflammation of right lower extremity: Secondary | ICD-10-CM | POA: Diagnosis not present

## 2020-06-21 NOTE — Progress Notes (Signed)
RAJNI, HOLSWORTH ([id number]) Visit Report for 06/20/2020 Debridement Details Patient Name: Date of Service: [name] 06/20/2020 10:15 A M Medical Record Number: [id number] Patient Account Number: [id number] Date of Birth/Sex: Treating RN: 14-Aug-1951 (69 y.o. [name] Primary Care Provider: [name], [place] RY Other Clinician: Referring Provider: Treating Provider/Extender: [name], [place] RY Weeks in Treatment: 430 Debridement Performed for Assessment: Wound #20 Left,Lateral Malleolus Performed By: Physician Ricard Dillon., MD Debridement Type: Debridement Severity of Tissue Pre Debridement: Fat layer exposed Level of Consciousness (Pre-procedure): Awake and Alert Pre-procedure Verification/Time Out Yes - 11:30 Taken: Start Time: 11:30 T Area Debrided (L x W): otal 0.8 (cm) x 0.6 (cm) = 0.48 (cm) Tissue and other material debrided: Viable, Non-Viable, Slough, Subcutaneous, Slough Level: Skin/Subcutaneous Tissue Debridement Description: Excisional Instrument: Curette Bleeding: Minimum Hemostasis Achieved: Pressure End Time: 11:32 Procedural Pain: 0 Post Procedural Pain: 0 Response to Treatment: Procedure was tolerated well Level of Consciousness (Post- Awake and Alert procedure): Post Debridement Measurements of Total Wound Length: (cm) 0.8 Width: (cm) 0.6 Depth: (cm) 0.3 Volume: (cm) 0.113 Character of Wound/Ulcer Post Debridement: Requires Further Debridement Severity of Tissue Post Debridement: Fat layer exposed Post Procedure Diagnosis Same as Pre-procedure Electronic Signature(s) Signed: 06/20/2020 6:03:41 PM By: Linton Ham MD Signed: 06/21/2020 5:24:50 PM By: Levan Hurst RN, BSN Entered By: Levan Hurst on 06/20/2020 11:38:10 -------------------------------------------------------------------------------- Debridement Details Patient Name: Date of Service: [name], [name] 06/20/2020 10:15 A M Medical Record Number:  [id number] Patient Account Number: [id number] Date of Birth/Sex: Treating RN: [birth year] (69 y.o. [name] Primary Care Provider: [name], [place] RY Other Clinician: Referring Provider: Treating Provider/Extender: [name], MA RY Weeks in Treatment: 430 Debridement Performed for Assessment: Wound #16 Right Calcaneus Performed By: Physician Ricard Dillon., MD Debridement Type: Debridement Level of Consciousness (Pre-procedure): Awake and Alert Pre-procedure Verification/Time Out Yes - 11:30 Taken: Start Time: 11:30 T Area Debrided (L x W): otal 1.1 (cm) x 1.6 (cm) = 1.76 (cm) Tissue and other material debrided: Viable, Non-Viable, Slough, Subcutaneous, Slough Level: Skin/Subcutaneous Tissue Debridement Description: Excisional Instrument: Curette Bleeding: Minimum Hemostasis Achieved: Pressure End Time: 11:31 Procedural Pain: 0 Post Procedural Pain: 0 Response to Treatment: Procedure was tolerated well Level of Consciousness (Post- Awake and Alert procedure): Post Debridement Measurements of Total Wound Length: (cm) 1.1 Stage: Unstageable/Unclassified Width: (cm) 1.6 Depth: (cm) 0.9 Volume: (cm) 1.244 Character of Wound/Ulcer Post Debridement: Requires Further Debridement Post Procedure Diagnosis Same as Pre-procedure Electronic Signature(s) Signed: 06/20/2020 6:03:41 PM By: Linton Ham MD Signed: 06/21/2020 5:24:50 PM By: Levan Hurst RN, BSN Entered By: Linton Ham on 06/20/2020 13:07:33 -------------------------------------------------------------------------------- Debridement Details Patient Name: Date of Service: [name] 06/20/2020 10:15 A M Medical Record Number: [id number] Patient Account Number: [id number] Date of Birth/Sex: Treating RN: 02-02-1951 (69 y.o. [name] Primary Care Provider: [name], [place] RY Other Clinician: Referring Provider: Treating Provider/Extender: [name], MA RY Weeks  in Treatment: 430 Debridement Performed for Assessment: Wound #19 Right,Lateral Malleolus Performed By: Physician Ricard Dillon., MD Debridement Type: Debridement Severity of Tissue Pre Debridement: Fat layer exposed Level of Consciousness (Pre-procedure): Awake and Alert Pre-procedure Verification/Time Out Yes - 11:30 Taken: Start Time: 11:30 T Area Debrided (L x W): otal 0.5 (cm) x 0.3 (cm) = 0.15 (cm) Tissue and other material debrided: Viable, Non-Viable, Slough, Subcutaneous, Slough Level: Skin/Subcutaneous Tissue Debridement Description: Excisional Instrument: Curette Bleeding: Minimum  Hemostasis Achieved: Pressure End Time: 11:31 Procedural Pain: 0 Post Procedural Pain: 0 Response to Treatment: Procedure was tolerated well Level of Consciousness (Post- Awake and Alert procedure): Post Debridement Measurements of Total Wound Length: (cm) 0.6 Width: (cm) 0.3 Depth: (cm) 0.1 Volume: (cm) 0.014 Character of Wound/Ulcer Post Debridement: Requires Further Debridement Severity of Tissue Post Debridement: Fat layer exposed Post Procedure Diagnosis Same as Pre-procedure Electronic Signature(s) Signed: 06/20/2020 6:03:41 PM By: Linton Ham MD Signed: 06/21/2020 5:24:50 PM By: Levan Hurst RN, BSN Entered By: Linton Ham on 06/20/2020 13:07:49 -------------------------------------------------------------------------------- Debridement Details Patient Name: Date of Service: [name] 06/20/2020 10:15 A M Medical Record Number: [id number] Patient Account Number: [id number] Date of Birth/Sex: Treating RN: 1951-10-08 (69 y.o. [name] Primary Care Provider: [name], [place] RY Other Clinician: Referring Provider: Treating Provider/Extender: [name], MA RY Weeks in Treatment: 430 Debridement Performed for Assessment: Wound #21 Left,Lateral Lower Leg Performed By: Physician Ricard Dillon., MD Debridement Type:  Debridement Severity of Tissue Pre Debridement: Fat layer exposed Level of Consciousness (Pre-procedure): Awake and Alert Pre-procedure Verification/Time Out Yes - 11:30 Taken: Start Time: 11:30 T Area Debrided (L x W): otal 3 (cm) x 0.8 (cm) = 2.4 (cm) Tissue and other material debrided: Viable, Non-Viable, Slough, Subcutaneous, Slough Level: Skin/Subcutaneous Tissue Debridement Description: Excisional Instrument: Curette Bleeding: Minimum Hemostasis Achieved: Pressure End Time: 11:31 Procedural Pain: 0 Post Procedural Pain: 0 Response to Treatment: Procedure was tolerated well Level of Consciousness (Post- Awake and Alert procedure): Post Debridement Measurements of Total Wound Length: (cm) 3 Width: (cm) 0.8 Depth: (cm) 0.1 Volume: (cm) 0.188 Character of Wound/Ulcer Post Debridement: Requires Further Debridement Severity of Tissue Post Debridement: Fat layer exposed Post Procedure Diagnosis Same as Pre-procedure Electronic Signature(s) Signed: 06/20/2020 6:03:41 PM By: Linton Ham MD Signed: 06/21/2020 5:24:50 PM By: Levan Hurst RN, BSN Entered By: Linton Ham on 06/20/2020 13:08:08 -------------------------------------------------------------------------------- HPI Details Patient Name: Date of Service: [name], [name] 06/20/2020 10:15 A M Medical Record Number: [id number] Patient Account Number: [id number] Date of Birth/Sex: Treating RN: [birth year] (69 y.o. [name] Primary Care Provider: [name], [place] RY Other Clinician: Referring Provider: Treating Provider/Extender: [name], MA RY Weeks in Treatment: 430 History of Present Illness HPI Description: this is a patient who initially came to [place] for wounds on the medial malleoli bilaterally as well as her upper medial lower extremities bilaterally. These wounds eventually healed with assistance of Apligraf's bilaterally. While this was occurring she developed the current wound which  opened into a fairly substantial wound on the right lower extremity. These are mostly secondary to venous stasis physiology however the patient also has underlying scleroderma, pulmonary hypertension. The wound has been making good progress lately with the Hydrofera Blue-based dressing. 03/17/2015; patient had a arterial evaluation a year ago. Her right ABI was 0.86 left was 1.0. T brachial index was 0.41 on the right 0.45 on the left. Her oe bilateral common femoral artery waveforms were triphasic. Her white popliteal posterior tibial artery and anterior tibial artery waveforms were monophasic with good amplitude. Luteal artery waveforms were biphasic it was felt that her bilateral great toe pressures are of normal although adequate for tissue healing. 03/24/2015. The condition of this wound is not really improved that. He was covered with as fibrinous surface slough and eschar. This underwent an aggressive debridement with both a curette and scalpel. I still cannot really get down to what I can  would consider to be a viable surface. There is no evidence of infection. Previous workup for ischemia roughly a year ago was negative nevertheless I think that continues to be a concern 04/07/15. The patient arrived for application of second Apligraf. Once again the surface of this wound is certainly less viable than I would like for an advanced treatment option. An aggressive debridement was done. She developed some arteriolar bleeding which required that along pressure and silver alginate. 04/21/15: change in the condition of this wound. Once again it is covered in a gelatinous surface slough. After debridement today surface of the wound looks somewhat better but now a heeling surface 05/05/15 Apligraf #4 applied.Still a lot of slough on this wound. 05/19/15 Apligraf #5 applied. Still a lot of slough on this wound I did not aggressively remove this 06/02/15; continued copious amounts of surface slough. This  debridement fairly easily. 06/08/2015 -- the last time she had a venous duplex study done was over 3 years ago and the surgery was prior to that. I have recommended that she sees Dr. early for a another opinion regarding a repeat venous duplex and possibly more endovenous ablation of vein stripping of micro-phlebotomies. 06/16/15; wound has a gelatinous surface eschar that the debridement fairly easily to a point. I don't disagree with the venous workup and perhaps even arterial re-evaluation. She is on prednisone 5 mg and continue his medications for her pulmonary artery hypertension I am not sure if the latter have any wound care healing issues I would need to investigate this. 06/23/15 continues with a gelatinous surface eschar with of fibrinous underlying. What I can see of this wound does not look unhealthy however I just can't get this material which I think is 2 different layers off. Empiric culture done 06/30/15; unfortunately not a lot of change in this wound. A gelatinous surface eschar is easily removed however it has a tight fibrinous surface underneath the. 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 The wound comes back and basically and unchanged state the. She has a gelatinous surface that is more easily removed however there is a tightly fibrinous surface underneath the. There is no evidence of infection. She has a vascular follow-up next month. I would have to inject her in order to do a more aggressive debridement of this area 07/21/15 the wound is roughly in the same state albeit the debridement is done with greater ease. There is less of the fibrinous eschar underneath the. There is no evidence of infection. She has follow-up with vascular surgery next week. No evidence of surrounding infection. Her original distal wounds healed while this one formed. 07/28/15; wound is easier to debride. No wound erythema. She is seeing Dr. [name] next week. 08/18/15 Has been to [place] for repeat ablation. Have been using medihoney pad with some improvement 08/25/15; absolutely no change in the condition of this wound in either its overall size or surface condition in many months now. At one point I had this down to [place] healthier surface I think with [facility] however this did not actually progress towards closure. Do not believe that the wound has actually deteriorated in terms of volume at all. We have been using a medihoney pad which allows easier debridement of the gelatinous eschar but again no overall actual improvement. the patient is going towards an ablation with pain and vascular which I think is scheduled for next week. The only other investigation that I could first  see would be a biopsy. She does have underlying scleroderma 09/02/15 eschar is much easier to debridement however the base of this does not look particularly vibrant. We changed to Iodoflex. The patient had her ablation earlier this week 09/09/15 again the debridement over the base of this wound is easier and the base of the wound looks considerably better. We will continue the Iodoflex. Dr. Tawni Millers has expressed his satisfaction with the result of her ablation 09/15/15 once again the wound is relatively free of surface eschar. No debridement was done today. It has a pale-looking base to it. although this is not as deep as it once was it seems to be expanding especially inferiorly. She has had recent venous ablation but this is no closer to healing.I've gone ahead and done a punch biopsy this from the inferior part of the wound close to normal skin 09/22/15: the wound is relatively free of surface eschar. There is some surrounding eschar. I'm not exactly sure at what level the surface is that I am seeing. 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 No evidence of atypical infection, malignancy. 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 No debridement  was done 09/29/15; the wound surface is relatively free of surface eschar. There is eschar surrounding the walls of the wound. Aside from the improvement in the amount of surface slough. This wound has not progressed any towards closure. There is not even a surface that looks like there at this is ready. There is no evidence of any infection nor maligancy based on biopsy I did on 9/15. I continued with the Iodoflex however I am looking towards some alternative to try to promote some closure or filling in of this surface. Consider triple layer Oasis. Collagen did not result in adequate control of the surface slough 10/13/15; the patient was in hospital last week with severe anemia. The wound looks somewhat better after debridement although there is widening medially. There is no evidence of infection. 10/20/15; patient's wound on the right lateral lower leg is essentially unchanged. This underwent a light surface debridement and in general the debridement is easier and the surface looks improved. I noted in doing this on the side of the wound what appears to be a piece of calcium deposition. The patient noted that she had previous things on the tips of her fingers. 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 She follows with rheumatology and I have asked them to talk to her about this. In view of that S the nonhealing ulcer may have something to do with calcinosis and also unrelieved Raynaud's disease in this area. I should note that her original wounds on the right and left medial malleolus and the inner aspects of both legs just below the knees did however heal over 10/30/15; the patient's wound on the right lateral lower leg is essentially unchanged. I was able to remove some calcified material from the medial wound edge. 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 Otherwise the wound appears essentially unchanged there is less adherent surface eschar. Some of the calcified material was sent to pathology for analysis 11/17/15. The patient's wound on the right lateral leg is essentially unchanged. Wider Medially. 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 There is some surface slough but I did not debridement this. No evidence of surrounding infection. 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 This will make healing difficult 12/15/15; the wound has less depth surface has a fibrinous slough and calcifications in the wound edge no evidence of infection 12/22/15; the wound definitely has less Fibrinous slough on the base. Calcifications around the wound edges are still evident. Although the wound bed looks healthier it is still pale in appearance. Previous biopsy did not show malignancy 01/04/15; surgical debridement of nonviable slough and subcutaneous tissue the wound cleans up quite nicely but appears to be expanding outward calcifications around the wound edges are still there. 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 She is to see her rheumatologist I'll see if he has any opinions on this. My punch biopsy done in September did not show calcifications although these are clearly evident. 01/19/16 light selective debridement of nonviable surface slough. There is epithelialization medially. This gives me reason for cautious optimism. She has been to see her rheumatologist, there is nothing that can be done for this type of soft tissue calcification associated with scleroderma 02/02/16 no debridement although there is a light surface slough. She has 2  peninsulas of skin 1 inferiorly and one medially. We continue to make a slight and slow but definite progressive here 02/16/16; light surface debridement with more attention to the circumference of the wound bed where the fibrinous eschar is more prevalent. No calcifications detected. She seems to have done nicely with the [facility] with some epithelialization and some improvement in the overall wound volume. 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 Continued attention to the circumference of the wound where the fibrinous eschar in calcinosis or prevalent. No calcifications were detected. She has continued improvement with Hydrofera Blue. The wound is no longer as deep 03/15/16 surgical debridement done to remove surface escha Especially around the circumference of the wound where there is nonviable subcutaneous tissue. In spite of this there is considerable improvement in the overall dimensions and depth of the wound. Islands of epithelialization are seen especially medially inferiorly and superiorly to a lesser extent. She is using Hydrofera Blue at home 03/29/16; surgical debridement done to remove surface eschar and nonviable subcutaneous tissue. This cleans up quite nicely mention slightly larger in terms of length and width however depth is less 04/12/16; continued gradual improvement in terms of depth and the condition of the wound base. Debridement is done. Continuing long standing Hydrofera Blue at home with Kerlix Coban wraps 04/26/16; continued gradual improvement in terms of depth and management as well as condition of the wound base. Surgical debridement done she continues with Hydrofera Blue. 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 She initially came to this clinic venous insufficiency ulcers which have long  since healed 05/17/16 continued improvement in terms of the depth and measurements of this wound although she has a tightly adherent fibrinous slough each time. We've been continued with long standing Hydrofera Blue which seems to done as well for this wound is many advanced treatment options. 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 She also has chronic venous insufficiency. She has an irritation on her lateral right  ankle secondary to our wraps 05/10/16; wound appears to be smaller especially on the medial aspect and especially in the width. Wound was debridement surface looks better. She is also been in the hospital apparently with anemia again she tells me she had an endoscopy. Since she got home after 3 days which I believe was sometime last week she has had an irritated painful area on the right lateral ankle surrounding the lateral malleolus 05/31/16; much more adherent surface slough today then recently although I don't think the dimensions of changed that much. A more aggressive debridement is required. The irritated area over her medial malleolus is more pruritic and painful and I don't think represents cellulitis 06/14/16; no major change in her wound dimensions however there is more tightly adherent surface slough which is disappointing. As well as she appears to have a new small area medially. Furthermore an irritated uncomfortable area on the lateral aspect of her right foot just below the lateral malleolus. 06/21/16; I'm seeing the patient back in follow-up for the new areas under her major wound on the right anterior leg. She has been using Hydrofera Blue to this area probably for several months now and although the dressings seem to be helping for quite a period of time I think things have stagnated lately. She comes in today with a relatively tight adherent surface slough and really no changes in the wound shape or dimension. The 2 small areas she had  inferiorly are tiny but still open they seem improved this well. There is no uncontrolled edema and I don't think there is any evidence of cellulitis. 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 Patient has had vascular evaluations both venous and arterial. I have biopsied this area. There is no obvious infection. The worrisome thing today is that she seems to be developing areas of erythema and epithelial damage on the medial aspect of the right foot. Also to a lesser degree inferior to the actual wound itself. Again I see no obvious changes to suggest cellulitis however as this is the only treatable option I will probably give her antibiotics. 07/13/16 no major change in the lady's large anterior right leg wound. Still covered with a very tightly adherent surface slough which is difficult to debridement. There is less erythema around this, culture last week grew pseudomonas I gave her ciprofloxacin. The area on her lateral right malleolus looks better- 08/02/16 the patient's wounds continued to decline. Her original large anterior right leg wound looks deeper. Still adherent surface slough that is difficult to debridement. She has a small area just below this and a punched-out wound just below her lateral malleolus. In the meantime she is been in hospital with apparently an upper GI bleed on Plavix and aspirin. She is now just on Plavix she received 3 units of packed cells 08/23/16; since I last saw this 3 weeks ago, the open large area on her right leg looks about the same syrup. She has a small satellite lesion just underneath this. The area on her medial right ankle is now a deep necrotic wound. I attempted to debridement this however there is just too much pain. 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 She follows with  vascular surgery and is scheduled for an angiogram in early September 09/06/16; the patient is going for  an arteriogram tomorrow. Her original large wound on the right calf is about the same the satellite lesion underneath it is about the same however the area on her medial ankle is now deeper with exposed tendon. I am no longer attempting to debride these wounds 09/20/16; the patient has undergone a right femoral endarterectomy and Dacron patch angioplasty. This seems to have helped the flow in her right leg. 10/04/16; Arrived today for aggressive debridement of the wounds on her right calf the original wound the one beneath it and a difficult area over her right lateral leg just above the lateral malleolus. 10/25/16; her 3 open wounds are about the same in terms of dimensions however the surface appears a lot healthier post debridement. Using Iodoflex 10/18/16 we have been using Iodoflex to her wounds which she tolerated with some difficulty. 10/11/16; has been using Santyl for a period of time with some improvement although again very adherent surface slough would prevent any attempted healing this. She has a original wound on the left calf, the satellite underneath that and the most recent wound on the right medial ankle. She has completed revascularization by Dr. early and has had venous ablation earlier. Want to go back to Iodoflex to see if week and get a healthier surface to this wound bed failing this I think she'll need to be taken to the OR and I am prepared to call Dr. Marla Roe to discuss this. She is obviously not a good candidate for general anesthesia however.; 11/08/16; I put her on Iodoflex last time to see if I can get the wound bed any healthier and unfortunately today still had tremendous surface slough. 11/15/16; 4 weeks' worth of Iodoflex with not much improvement. Debridement on the major wounds on her left anterior leg is easier however this does not maintain from week to week.  The punched-out area on her medial right ankle 11/29/16; I attempted to change the patient last visit to [facility] however she states this burned and was very uncomfortable therefore we gave her permission to go back to the eye out of complex which she already had at home. Also she noted a lot of pain and swelling on the lateral aspect of her leg before she traveled to [facility] for the holidays, I called her in doxycycline over the phone. This seems to have helped 12/06/16; Wounds unchanged by in large. Using Iodoflex 12/13/16; her wounds today actually looks somewhat better. The area on the right lateral lower leg has reasonably healthy-looking granulation and perhaps as actually filled and a bit. Debridement of the 2 wounds on the medial calf is easier and post debridement appears to have a healthier base. We have referred her to Dr. Migdalia Dk for consideration of operative debridement 12/20/16; we have a quick note from Dr. Merri Ray who feels that the patient needs to be referred to an academic center/plastic surgeon. This is due to the complexity of the patient's medical issues as it applies to anything in the OR. We have been using Iodoflex 12/27/16; in general the wounds on the right leg are better in terms of the difficult to remove surface slough. She has been using Iodoflex. She is approved for Apligraf which I anticipated ordering in the next week or 2 when we get a better-looking surface 01/04/16 the deep wounds on the right leg generally look better. Both of them are debrided further surface slough. The area on the lateral right leg was not debrided. She is approved for Apligraf I  think I'll probably order this either next week or the week after depending on the surface of the wounds superiorly. We have been using Iodoflex which will continue until then 01/11/16; the deep wounds on the right leg again have a surface slough that requires debridement. I've not been  able to get the wound bed on either one of these wounds down to what would be acceptable for an advanced skin stab-like Apligraf. The area on the medial leg has been improving. We have been using hot Iodoflex to all wounds which seems to do the best at at least limiting the nonviable surface 01/24/17; we have continued Iodoflex and all her wound areas. Her debridement Gen. he is easier and the surface underneath this looks viable. Nevertheless these are large area wounds with exposed muscle at least on the anterior parts. We have ordered Apligraf's for 2 weeks from now. The patient will be away next week 02/07/17; the patient was close to have first Apligraf today however we did not order one. I therefore replaced her Iodoflex. She essentially has 3 large punched- out areas on her right anterior leg and right medial ankle. 02/11/17; Apligraf #1 02/25/17; Apligraf #2. In general some improvement in the right medial ankle and right anterior leg wounds. The larger wound medially perhaps some better 03/11/17; Apligraf #3. In general continued improvement in the right medial ankle and the right anterior leg wounds. 03/25/17 Apligraf #4. In general continued improvement especially on the right medial ankle and the lower 04/08/17; Apligraf #5 in general continued improvement in all wound sites. 04/22/17; post Apligraf #5 her wound beds continued to look a lot better all of them up to the surface of the surrounding skin. Had a caramel-colored slough that I did not debrided in case there is residual Apligraf effect. The wounds are as good as I have seen these looking quite some time. 04/29/17; we applied [place] and last week after completing her Apligraf. Wounds look as though they've contracted somewhat although they have a nonviable surface which was problematic in the past. Apparently she has been approved for further Apligraf's. We applied Iodosorb today after debridement. 05/06/17; we're fortunate enough  today to be able to apply additional Apligraf approved by her insurance. In general all of her wounds look better Apligraf #6 05/24/17; Apligraf #7 continued improvement in all wounds 3 06/10/17 Apligraf #8. Continued improvement in the surface of all wounds. Not much of an improvement in dimensions as I might a follow 06/24/17 Apligraf #9 continued general improvement although not as much change in the wound areas I might of like. She has a new open area on the right anterior lateral ankle very small and superficial. She also has a necrotic wound on the tip of her right index finger probably secondary to severe uncontrolled Raynaud's phenomenon. She is already on sildenafil and already seen her rheumatologist who gave her Keflex. 07/08/17; Apligraf #10. Generalized improvement although she has a small additional wound just medial to the major wound area. 07/22/17; after discussion we decided not to reorder any further Apligraf's although there is been considerable improvement with these it hasn't been recently. The major wound anteriorly looks better. Smaller wounds beneath this and the more recent one and laterally look about the same. The area on the right lateral lower leg looks about the same 07/29/17 this is a patient who is exceedingly complex. She has advanced scleroderma, crest syndrome including calcinosis, PAD status post revascularization, chronic venous insufficiency status post ablations. She  initially presented to this clinic with wounds on her bilateral lower legs however these closed. More recently we have been dealing with a large open area superiorly on the right anterior leg, a smaller wound underneath this and then another one on the right medial lower extremity. These improve significantly with 10 Apligraf applications. Over the last 2-3 weeks we are making good progress with Hydrofera Blue and these seem to be making progress towards closure 08/19/17; wounds continued to make good  improvement with Hydrofera Blue and episodic aggressive debridements 08/26/17; still using Hydrofera Blue. Good improvements 09/09/17; using Hydrofera Blue continued improvement. Area on the lateral part of her right leg has only a small remaining open area. The small inferior satellite region is for all intents and purposes closed. Her major wound also is come in in terms of depth and has advancing epithelialization. 09/16/17; using Hydrofera Blue with continued improvement. The smaller satellite wound. We've closed out today along with a new were satellite wound medially. The area on her medial ankle is still open and her major wound is still open but making improvement. All using Hydrofera Blue. Currently 09/30/17; using Hydrofera Blue. Still a small open area on the lateral right ankle area and her original major wound seems to be making gradual and steady improvement. 10/14/17; still using Hydrofera Blue. Still too small open areas on the right lateral ankle. Her original major wound is horizontal and linear. The most problematic area paradoxically seems to be the area to the medial area wears I thought it would be the lateral. 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 10/28/17; still using Hydrofera Blue. Right lateral ankle has a very small open area superiorly on the most lateral part of the wound. Her original open wound has 2 open areas now separated by normal skin and we've redefined this. 11/11/17; still using Hydrofera Blue area and right lateral ankle continues to have a small open area on mostly the lateral part of the wound. Her original wound has 2 small open areas now separated by a considerable amount of normal skin 11/28/17; the patient called in slightly before Thanksgiving to report pain and erythema above the wound on the right leg. In the past this is responded well to treatment for cellulitis and I gave her over the phone  doxycycline. She stated this resulted in fairly abrupt improvement. We have been using Hydrofera Blue for a prolonged period of time to the larger wound anteriorly into the remaining wound on her right right lateral ankle. The latter is just about closed with only a small linear area and the bottom of the [place]. 12/02/17; use endoform and left the dressing on since last visit. There is no tenderness and no evidence of infection. 12/16/17; patient has been using Endoform but not making much progress. The 2 punched out open areas anteriorly which were the reminiscence of her major wound appear deeper. The area on the lateral aspect of her right calf also appears deeper. Also she has a puzzling tender swelling above her wound on the right leg. This seems larger than last time. Just above her wounds there appears to be some fluctuance in this area it is not erythematous and there is no crepitus 12/30/17; patient has been using Endoform up until last week we used Hydrofera Blue. Ultrasound of the swelling above her 2 major wounds last time was negative for a fluid collection. I gave her cefaclor for the erythema and tenderness  in this area which seems better. Unfortunately both punched out areas anteriorly and the area on her right medial lower leg appear deeper. In fact the lateral of the wounds anteriorly actually looks as though it has exposed tendon and/or muscle sheath. She is not systemically unwell. She is complaining of vaginitis type symptoms presumably Candida from her antibiotics. 01/06/18; we're using santyl. she has 2 punched out areas anteriorly which were initially part of a large wound. Unfortunately medially this is now open to tendon/muscle. All the wounds have the same adherent very difficult to debride surface. 01/20/18; 2 week follow-up using Santyl. She has the 2 punched out areas anteriorly which were initially part of her large surface wound there. Medially this still has exposed  muscle. All of these have the same tightly adherent necrotic surface which requires debridement. PuraPly was not accepted by the patient's insurance however her insurance I think it changed therefore we are going to run Apligraf to gain 02/03/18; the patient has been using Santyl. The wound on the right lateral ankle looks improved and the 2 areas anteriorly on the right leg looks about the same. The medial one has exposed muscle. The lateral 1 requiresdebridement. We use PuraPly today for the first week 02/10/18; PuraPly #2. The patient has 3 wounds. The area on the right lateral ankle, 2 areas anteriorly that were part of her original large wound in this area the medial one has exposed muscle. All of the wounds were lightly debrided with a number 3 curet. PuraPly #2 applied the lateral wound on the calf and the right lateral ankle look better. 02/17/18; PuraPly #3. Patient has 3 wounds. The area on the right lateral ankle in 2 areas internally that were part of her original large wound. The lateral area has exposed muscle. She arrived with some complaints of pain around the right ankle. 02/24/18; PuraPly #4; not much change in any of the 3 wound areas. Right lateral ankle, right lateral calf. Both of these required debridement with a #3 curet. She tolerates this marginally. The area on the medial leg still has exposed muscle. Not much change in dimensions 03/03/18 PuraPly #5. The area on the medial ankle actually looks better however the 2 separate areas that were original parts of the larger right anterior leg wound look as though they're attempting to coalesce. 03/10/18; PuraPly #6. The area on the medial ankle actually continues to look and measures smaller however the 2 separate areas that were part of the original large wound on the right anterior leg have now coalesced. There hasn't been much improvement here. The lateral area actually has underlying exposed muscle 03/17/18-she is here in follow-up  evaluation for ulcerations to the right lower extremity. She is voicing no complaints or concerns. She tolerated debridement. Puraply#7 placed 03/24/18; difficult right lower extremity ulcerations. PuraPly #8 place. She is been approved for [employer]. She did very well with Apligraf today however she is apparently reached her "lifetime max" 03/31/18; marginal improvement with PuraPly although her wounds looked as good as they have in several weeks today. Used TheraSkins #1 04/14/18 TheraSkin #2 today 04/28/18 TheraSkins #3. Wound slightly improved 05/12/18; TheraSkin skin #4. Wound response has been variable. 05/27/18 TheraSkin #5. Generally improvement in all wound areas. I've also put her in 3 layer compression to help with the severe venous hypertension 06/09/18; patient is done quite well with the TheraSkins unfortunately we have no further applications. I also put her in 3 layer compression last week and that really  seems to of helped. 06/16/18; we have been using silver collagen. Wounds are smaller. Still be open area to the muscle layer of her calf however even that is contracted somewhat. She tells me that at night sometimes she has pain on the right lateral calf at the site of her lower wound. Notable that I put her into 3 layer compression about 3 weeks ago. She states that she dangles her leg over the bed that makes it feel better but she does not describe claudication during the day She is going to call her secondary insurance to see if they will continue to cover advanced treatment products I have reviewed her arterial studies from 01/22/17; this showed an ABI in the right of 1 and on the left noncompressible. TBI on the right at 0.30 on the left at 0.34. It is therefore possible she has significant PAD with medial calcification falsely elevating her ABI into the normal range. I'll need to be careful about asking her about this next week it's possible the 3 layer compression is too  much 06/23/18; was able to reapply TheraSkin 1 today. Edema control is good and she is not complaining of pain no claudication 07/07/18;no major change. New wound which was apparently a taper removal injury today in our clinic between her 2 wounds on the right calf TheraSkins #2 07/14/18; I think there is some improvement in the right lateral ankle and the medial part of her wound. There is still exposed muscle medially. 07/28/18; two-week follow-up. TheraSkins#3. Unfortunately no major change. She is not a candidate I don't think for skin grafting due to severe venous hypertension associated with her scleroderma and pulmonary hypertension 08/11/18 Patient is here today for her [employer] application #19 (#5 of the second set). She seems to be doing well and in the base of the wound appears to show some progress at this point. This is the last approved Theraskin of the second set. 08/25/18; she has completed TheraSkin. There has been some improvement on the right lateral calf wound as well as the anterior leg wounds. The open area to muscle medially on the anterior leg wound is smaller. I'm going to transition her back to [facility] under Kerlix Coban change every second day. She reports that she had some calcification removed from her right upper arm. We have had previous problems with calcifications in her wounds on her legs but that has not happened recently 09/08/18;using Hydrofera Blue on both her wound areas. Wounds seem to of contracted nicely. She uses Kerlix Coban wrap and changes at home herself 09/22/2018; using Hydrofera Blue on both her wound areas. Dimensions seem to have come down somewhat. There is certainly less depth in the medial part of the mid tibia wound and I do not think there is any exposed muscle at this point. 10/06/2018; 2-week follow-up. Using [facility] on her wound areas. Dimensions have come down nicely both on the right lateral ankle area in the right mid tibial area.  She has no new complaints 10/20/2018; 2-week follow-up. She is using Hydrofera Blue. Not much change from the last time she was here. The area on the lateral ankle has less depth although it has raised edges on one side. I attempted to remove as much of the raised edge as I could without creating more additional wounds. The area on the right anterior mid tibia area looks the same. 11/03/2018; 2-week follow-up she is using Hydrofera Blue. On the right anterior leg she now has 2 wounds  separated by a large area of normal skin. The area on the medial part still has I think exposed muscle although this area itself is a lot smaller. The area laterally has some depth. Both areas with necrotic debris. The area on her right lateral ankle has come in nicely 11/17/2018; patient continues to use Hydrofera Blue. We have been increasing separation of the 2 wounds anteriorly which were at one point joint the area on the right lateral calf continues to have I think some improvement in depth. 12/08/2018; patient continues to use Hydrofera Blue. There is some improvement in the area on the right lateral calf. The 2 areas that were initially part of the original large wound in the mid right tibia are probably about the same. In fact the medial area is probably somewhat larger. We will run puraply through the patient's insurance 12/22/2018; she has been using Hydrofera Blue. We have small wounds on the right lateral calf and 2 small areas that were initially part of a large wound in the right mid tibia. We applied pure apply #1 today. 12/29/2018; we applied puraply #2. Her wounds look somewhat better especially on the right lateral calf and the lateral part of her original wound in the mid tibial area. 01/05/2019; perhaps slightly improved in terms of wound bed condition but certainly not as much improvement as I might of liked. Puraply #3 1/13: we did not have a correct sized puraply to apply. wounds more pinched out  looking, I increased her compression to 3 layer last week to help with significant multilevel venous hypertension. Since then I've reviewed her arterial status. She has a right femoral endarterectomy and a distal left SFA stent. She was being followed by Dr. [name] for a period however she does not appear to have seen him in 3 years. I will set up an appointment. 1/20. The patient has an additional wound on the right lateral calf between the distal wound and proximal wounds. We did not have Puraply last week. Still does not have a follow-up with Dr. early 1/27: Follow-up with Dr. early has been arranged apparently with follow-up noninvasive studies. Wounds are measuring roughly the same although they certainly look smaller 2/3; the patient had non invasive studies. Her ABIs on the right were 0.83 and on the left 1.02 however there was no great toe pressure bilaterally. Also worrisome monophasic waveforms at the PTA and dorsalis pedis. We are still using Puraply. We have had some improvement in all of the wounds especially the lateral part of the mid tibial area. 2/10; sees Dr. early of vein and vascular re-arterial studies next week. Puraply reapplied today. 2/17; Dr. early of vein and vascular his appointment is tomorrow. Puraply reapplied after debridement of all wounds 2/24; the patient saw Dr. early I reviewed his note. sHe noted the previous right femoral endarterectomy with a Dacron patch. He also noted the normal ABI and the monophasic waveforms suggesting tibial disease. Overall he did not feel that she had any evidence of arterial insufficiency that would impair her wound healing. He did note her venous disease as well. He suggested PRN follow-up. 3/2; I had the last puraply applied today. The original wounds over the mid tibia area are improved where is the area on the right lower leg is not 3/9; wounds are smaller especially in the right mid tibia perhaps slightly in the right lateral calf.  We finished with puraply and went to endoform today 3/23; the patient arrives after 2 weeks. She  has been using endoform. I think all of her wounds look slightly better which includes the area on the right lateral calf just above the right lateral malleolus and the 2 in the right mid tibia which were initially part of the same wound. 4/27; TELEHEALTH visit; the patient was seen for telehealth visit today with her consent in the middle of the worldwide epidemic. Since she was last here she called in for antibiotics with pain and tenderness around the area on the right medial ankle. I gave her empiric doxycycline. She states this feels better. She is using endoform on both of these areas 5/11 TELEHEALTH; the patient was seen for telehealth visit today. She was accompanied at home by her husband. She has severe pulmonary hypertension accompanied scleroderma and in the face of the Covid epidemic cannot be safely brought into our clinic. We have been using endoform on her wound areas. There are essentially 3 wound areas now in the left mid tibia now 2 open areas that it one point were connected and one on the right lateral ankle just above the malleolus. 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 She cannot come to clinic. The wounds were reviewed today via telehealth. She has severe chronic venous hypertension which I think is centrally mediated. She has wounds on her right anterior tibia and right lateral ankle area. These are chronic. She has been using endoform. 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 We have been following her from telehealth. She has severe chronic venous hypertension which  may be mostly centrally mediated secondary to right heart heart failure. She has wounds on her right anterior tibia and right lateral ankle these are chronic we have been using endoform 6/22; TELEHEALTH; this patient was seen today via telehealth. 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 Since the last time we had contact with this patient she developed some pain and erythema around the wound on her right lateral malleolus/ankle and we put in antibiotics for her. This is resulted in good improvement with resolution of the erythema and tenderness. I changed her to silver alginate last time. We had been using endoform for an extended period of time 7/13; TELEHEALTH; this patient was seen today via telehealth. 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 She would continue to be at a prohibitive risk to be brought into our clinic unless this was absolutely necessary. These visits have been done with her approval as well as her husband. We have been using silver alginate to the areas on the right mid tibia and right lateral lower leg. 7/27 TELEHEALTH; patient was seen along with her husband today via telehealth. 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 We changed her to sample last visit. She has 2 areas a chronic wound on her right mid tibia and one just above her ankle. These were not the original wounds when she came into this clinic but she developed them during treatment 8/17; she comes in for her first face-to-face visit in a long period. She  has a remaining area just medial to the right tibia which is the last open part of her large wound across this area. She also has an area on the right lateral lower leg. We prescribed Santyl last telehealth visit but they were concerned that this  was making a deeper so they put silver alginate on it last week. Her husband changes the dressings. 8/31; using Santyl to the 2 wound areas some improvement in wound surfaces. Husband has surgery in 2 weeks we will put her out 3 weeks. Any of the advanced treatment options that I can think of that would be eligible for this wound would also cause her to have to come in weekly. The risk that the patient is just too high 9/21. Using Santyl to the 2 wound areas. Both of these are somewhat better although the medial mid tibia area still has exposed muscle. Lateral ankle requiring debridement. Using Santyl 10/12; using Santyl to the 2 wound areas. One on the right lateral ankle and the other in the medial calf which still has exposed muscle. Both areas have come down in size and have better looking surfaces. She has made nice progress with santyl 11/2; we have been using Santyl to the 2 wound areas. Right lateral ankle and the other in the mid tibia area the medial part of this still has exposed muscle. 11/23/2019 on evaluation today patient appears to be doing about the same really with regard to her wounds. She is actually not very pleased with how things seem to be progressing at this point she tells me that she really has not noted much improvement unfortunately. With that being said there is no signs of active infection at this time. There is some slough buildup noted at this time which again along with some dry skin around the edges of the wound I think would benefit her to try to debride some of this away. Fortunately her pain is doing fairly well. She still has exposed muscle in the right medial/tibial area. 12/14; TELEHEALTH; she was changed to [facility] to the right calf wound and right lateral ankle wound when she was here last time. Unfortunately since then she had a fall with a pelvic fracture and a fracture of her wrist. She was apparently hospitalized for 5 days. I have not looked at  her discharge summary. She apparently came out of the hospital with a blister on her right heel. She was seen today via telehealth by myself and our case manager. The patient and her husband were present. She has been using Hydrofera Blue at our direction from the last time she was in the clinic. There is been no major improvement in fact the areas appear deeper and with a less viable surface 01/04/2020; TELEHEALTH; the patient was seen today in accompaniment of her husband and our nurse. She has 3 open areas 1 on the right medial mid tibia, one on the right lateral ankle and a large eschared pressure area on the right heel. We have been using Iodoflex to the 2 original wounds. 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 It is simply too perilous for her to be seen in any other way 1/26; TELEHEALTH; the patient was seen today in accompaniment of her husband and our RN one of our nurses. She still has the 3 open areas 1 on the right medial mid tibia which is the remanent of a more extensive wound in this area, one on the right lateral ankle  and a large eschared pressure area on the right heel. We have been using Iodoflex to the 2 original wounds and a bed at 9 application to the eschared area on the heel 2/15; the first time we have seen this patient and then several months out of concern for the pandemic. She had a large horizontal wound in the mid tibia. Only the medial aspect of this is still open. Area on the lateral ankle is just about closed. She had a new pressure ulcer on the right heel which I have removed some of the eschar. We have been using Iodoflex which I will continue. The area in the mid tibia has a round circle in the middle of exposed muscle. I think we would have to use an advanced treatment product to stimulate granulation over this area. We will run this through her insurance. She is not eligible for plastic surgery for many reasons 3/1; TELEHEALTH.  The patient was seen today by telehealth. 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 We have been using Iodoflex to the wound areas which include the right anterior mid tibia, right lateral ankle and the right heel. All of these were reviewed 3/8; the patient's wound just above the right ankle is closed. She still has a contracting black eschar on the heel where she had a pressure ulcer. The medial part of her original wound on the mid tibia has exposed muscle. We have made really made no progress in this area although we have managed to get a lot of the original wound in this area to close. We used Apligraf #1 today 3/22; the patient's ankle wound remains closed. She still has a contracting black eschar on the heel although it seems to have less surface area. It still not clear whether there is any depth here. We used Apligraf #2 today in an attempt to get granulation over the exposed muscle and what is left of her mid tibia area. 4/5; everything is closed except the medial aspect of the mid tibia wound, as well as the pressure injury on the right heel. She has been using Betadine to the right heel which has been gradually contracting. We used Apligraf #3 today. 4/19; Apligraf #4. Still a pressure area on the right heel she has been using Betadine superficial excoriated areas she has been applying salicylic acid to on the anterior leg and the thick horn on the leg just above her wound area 5/3; Apligraf #5. Unfortunately she came in today with a reopening of the area on the right lateral lower leg. Not much change in the wound we have been treating in the mid calf. Quite a bit of edema surrounding this wound. She reports that she was up on her feet quite a bit. The area on the right heel is separating she has been using Betadine She comes in with a new wound on the left lateral malleolus which is very  disappointing this is been open since last week 5/17; we applied her last Apligraf last time. Unfortunately that did not have any effect on the deep area medially in the mid tibial area. However the area over the right lateral malleolus is a lot better. Right heel also is contracted we used Iodoflex last time. The new wound on the left lateral malleolus were using Santyl to. This required debridement. 6/1. Not much change in the area on the right medial mid tibia. Right lateral  ankle is improved she has the necrotic area on the right heel which was a pressure area. New area on the left lateral malleolus from last time. I changed her to Iodoflex to the area on the left lateral malleolus she said this hurt I have put her back on Santyl 6/15; right mid tibia is unchanged. I do not think there is anything I can do to this topically that we will get this to granulate over the muscle. And I am furthermore I am not sure that she is a surgical candidate either because of her severe pulmonary issues or because she really does not want to go through it. The area on the right lateral malleolus is a small punched out area. The area on the left lateral malleolus again small punched out with nonviable surface Pressure ulcer on the right heel at separating black eschar. I went ahead and remove this today she has an area on the left anterior mid tibia just laterally. Geographic wounds debris on the surface. Some erythema here. 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 There is skin changes widely Change the primary dressing to Sorbact to all wound areas. She needs compression on the left leg as well as the right 6/21; right mid tibia which was her one remaining wound at 1 point is unchanged The area on the right lateral malleolus actually is close to closing over still a small punched out area The area on the left lateral malleolus again a deep wound it is this 1 that she feels pain  in. Pressure ulcer on the right heel was cultured today. New area on the left anterior mid tibia several geographic wounds last week in the setting of chronic venous insufficiency this actually looks some better Electronic Signature(s) Signed: 06/20/2020 6:03:41 PM By: Linton Ham MD Entered By: Linton Ham on 06/20/2020 13:09:16 -------------------------------------------------------------------------------- Physical Exam Details Patient Name: Date of Service: [name] 06/20/2020 10:15 A M Medical Record Number: [id number] Patient Account Number: [id number] Date of Birth/Sex: Treating RN: [birth year] (69 y.o. [name] Primary Care Provider: [name], [place] RY Other Clinician: Referring Provider: Treating Provider/Extender: [name], MA RY Weeks in Treatment: 32 Constitutional Patient is hypertensive.. Pulse regular and within target range for patient.Marland Kitchen Respirations regular, non-labored and within target range.. Temperature is normal and within the target range for the patient.Marland Kitchen Appears in no distress. Notes Wound exam Midportion of the right tibia about the same still exposed muscle. We have made no progress here The right lateral ankle wound is just about closed Left lateral ankle is debrided with a #3 curette. Still not able to get to a viable surface here. Right heel also debrided of necrotic material. A culture done after this debridement. Fortunately I am able to get the surface of this to a better looking area The worrisome area from last week on the left anterior lower leg mid aspect looks somewhat better there is less erythema. The wound surface carefully debrided with a #3 curette. They cleaned up quite nicely there is 3 of them in this area Electronic Signature(s) Signed: 06/20/2020 6:03:41 PM By: Linton Ham MD Entered By: Linton Ham on 06/20/2020  13:10:36 -------------------------------------------------------------------------------- Physician Orders Details Patient Name: Date of Service: [name], [name] 06/20/2020 10:15 A M Medical Record Number: [id number] Patient Account Number: [id number] Date of Birth/Sex: Treating RN: 1951-11-18 (69 y.o. 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 Nurse Visit: - Friday for rewrap Dressing Change Frequency Do not change entire dressing for one week. Skin Barriers/Peri-Wound Care Moisturizing lotion - both legs Wound Cleansing May shower with protection. Primary Wound Dressing Wound #16 Right Calcaneus Cutimed Sorbact Wound #19 Right,Lateral Malleolus Cutimed Sorbact Wound #20 Left,Lateral Malleolus Cutimed Sorbact Wound #21 Left,Lateral Lower Leg Cutimed Sorbact Wound #5 Right,Medial Lower Leg Cutimed Sorbact Secondary Dressing Wound #16 Right Calcaneus Dry Gauze Heel Cup Wound #19 Right,Lateral Malleolus Dry Gauze Wound #20 Left,Lateral Malleolus Dry Gauze Wound #21 Left,Lateral Lower Leg Dry Gauze Wound #5 Right,Medial Lower  Leg Dry Gauze Edema Control 3 Layer Compression System - Bilateral Avoid standing for long periods of time Elevate legs to the level of the heart or above for 30 minutes daily and/or when sitting, a frequency of: - throughout the day Off-Loading Turn and reposition every 2 hours Other: - float heels off of bed/chair with pillow under calves Laboratory naerobe culture (MICRO) - Right calcaneus - (ICD10 L89.616 - Pressure-induced deep tissue Bacteria identified in Unspecified specimen by A damage of right heel) LOINC Code: 073-7 Convenience Name: Anerobic culture Electronic Signature(s) Signed: 06/20/2020 6:03:41 PM By: Linton Ham MD Signed: 06/21/2020 5:24:50 PM By: Levan Hurst RN, BSN Entered By: Levan Hurst on 06/20/2020 11:43:20 -------------------------------------------------------------------------------- Problem List Details Patient Name: Date of Service: [name], [name] 06/20/2020 10:15 A M Medical Record Number: [id number] Patient Account Number: [id number] Date of Birth/Sex: Treating RN: 07/20/1951 (69 y.o. 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 02/24/2015 No Yes I87.331 Chronic venous hypertension (idiopathic) with ulcer and inflammation of right 10/04/2016 No Yes lower extremity L89.616 Pressure-induced deep tissue damage of right heel 12/14/2019 No Yes L97.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 05/31/2020 No Yes L97.828  Non-pressure chronic ulcer of other part of left lower leg with other specified 06/14/2020 No Yes severity Inactive Problems ICD-10 Code Description Active Date Inactive Date 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 06/20/2020 10:15 A M Medical Record Number: [id number] Patient  Account Number: [id number] Date of Birth/Sex: Treating RN: [birth year] (69 y.o. [name] Primary Care Provider: [name], [place] RY Other Clinician: Referring Provider: Treating Provider/Extender: [name], MA RY Weeks in Treatment: 430 Subjective History of Present Illness (HPI) this is a patient who initially came to [place] for wounds on the medial malleoli bilaterally as well as her upper medial lower extremities bilaterally. These wounds eventually healed with assistance of Apligraf's bilaterally. While this was occurring she developed the current wound which opened into a fairly substantial wound on the right lower extremity. These are mostly secondary to venous stasis physiology however the patient also has underlying scleroderma, pulmonary hypertension. The wound has been making good progress lately with the Hydrofera Blue-based dressing. 03/17/2015; patient had a arterial evaluation a year ago. Her right ABI was 0.86 left was 1.0. T brachial index was 0.41 on the right 0.45 on the left. Her oe bilateral common femoral artery waveforms were triphasic. Her white popliteal posterior tibial artery and anterior tibial artery waveforms were  monophasic with good amplitude. Luteal artery waveforms were biphasic it was felt that her bilateral great toe pressures are of normal although adequate for tissue healing. 03/24/2015. The condition of this wound is not really improved that. He was covered with as fibrinous surface slough and eschar. This underwent an aggressive debridement with both a curette and scalpel. I still cannot really get down to what I can would consider to be a viable surface. There is no evidence of infection. Previous workup for ischemia roughly a year ago was negative nevertheless I think that continues to be a concern 04/07/15. The patient arrived for application of second Apligraf. Once again the surface of this wound is certainly less viable than I would like for an advanced treatment option. An aggressive debridement was done. She developed some arteriolar bleeding which required that along pressure and silver alginate. 04/21/15: change in the condition of this wound. Once again it is covered in a gelatinous surface slough. After debridement today surface of the wound looks somewhat better but now a heeling surface 05/05/15 Apligraf #4 applied.Still a lot of slough on this wound. 05/19/15 Apligraf #5 applied. Still a lot of slough on this wound I did not aggressively remove this 06/02/15; continued copious amounts of surface slough. This debridement fairly easily. 06/08/2015 -- the last time she had a venous duplex study done was over 3 years ago and the surgery was prior to that. I have recommended that she sees Dr. early for a another opinion regarding a repeat venous duplex and possibly more endovenous ablation of vein stripping of micro-phlebotomies. 06/16/15; wound has a gelatinous surface eschar that the debridement fairly easily to a point. I don't disagree with the venous workup and perhaps even arterial re-evaluation. She is on prednisone 5 mg and continue his medications for her pulmonary artery hypertension I am  not sure if the latter have any wound care healing issues I would need to investigate this. 06/23/15 continues with a gelatinous surface eschar with of fibrinous underlying. What I can see of this wound does not look unhealthy however I just can't get this material which I think is 2 different layers off. Empiric culture done 06/30/15; unfortunately not a lot of change in this wound. A gelatinous surface eschar is easily removed however it has a tight fibrinous surface underneath the. 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 The wound comes back and basically and unchanged state the.  She has a gelatinous surface that is more easily removed however there is a tightly fibrinous surface underneath the. There is no evidence of infection. She has a vascular follow-up next month. I would have to inject her in order to do a more aggressive debridement of this area 07/21/15 the wound is roughly in the same state albeit the debridement is done with greater ease. There is less of the fibrinous eschar underneath the. There is no evidence of infection. She has follow-up with vascular surgery next week. No evidence of surrounding infection. Her original distal wounds healed while this one formed. 07/28/15; wound is easier to debride. No wound erythema. She is seeing Dr. [name] next week. 08/18/15 Has been to [place] for repeat ablation. Have been using medihoney pad with some improvement 08/25/15; absolutely no change in the condition of this wound in either its overall size or surface condition in many months now. At one point I had this down to [place] healthier surface I think with [facility] however this did not actually progress towards closure. Do not believe that the wound has actually deteriorated in terms of volume at all. We have been using a medihoney pad which allows easier debridement of the gelatinous eschar but again no overall actual improvement. the patient is going towards an  ablation with pain and vascular which I think is scheduled for next week. The only other investigation that I could first see would be a biopsy. She does have underlying scleroderma 09/02/15 eschar is much easier to debridement however the base of this does not look particularly vibrant. We changed to Iodoflex. The patient had her ablation earlier this week 09/09/15 again the debridement over the base of this wound is easier and the base of the wound looks considerably better. We will continue the Iodoflex. Dr. Tawni Millers has expressed his satisfaction with the result of her ablation 09/15/15 once again the wound is relatively free of surface eschar. No debridement was done today. It has a pale-looking base to it. although this is not as deep as it once was it seems to be expanding especially inferiorly. She has had recent venous ablation but this is no closer to healing.I've gone ahead and done a punch biopsy this from the inferior part of the wound close to normal skin 09/22/15: the wound is relatively free of surface eschar. There is some surrounding eschar. I'm not exactly sure at what level the surface is that I am seeing. 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 No evidence of atypical infection, malignancy. 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 No debridement was done 09/29/15; the wound surface is relatively free of surface eschar. There is eschar surrounding the walls of the wound. Aside from the improvement in the amount of surface slough. This wound has not progressed any towards closure. There is not even a surface that looks like there at this is ready. There is no evidence of any infection nor maligancy based on biopsy I did on 9/15. I continued with the Iodoflex however I am looking towards some alternative to try to promote some closure or filling in of this surface. Consider triple layer Oasis. Collagen did not result in adequate  control of the surface slough 10/13/15; the patient was in hospital last week with severe anemia. The wound looks somewhat better after debridement although there is widening medially. There is no evidence of infection. 10/20/15; patient's wound on the right lateral lower leg is essentially unchanged.  This underwent a light surface debridement and in general the debridement is easier and the surface looks improved. I noted in doing this on the side of the wound what appears to be a piece of calcium deposition. The patient noted that she had previous things on the tips of her fingers. 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 She follows with rheumatology and I have asked them to talk to her about this. In view of that S the nonhealing ulcer may have something to do with calcinosis and also unrelieved Raynaud's disease in this area. I should note that her original wounds on the right and left medial malleolus and the inner aspects of both legs just below the knees did however heal over 10/30/15; the patient's wound on the right lateral lower leg is essentially unchanged. I was able to remove some calcified material from the medial wound edge. 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 Otherwise the wound appears essentially unchanged there is less adherent surface eschar. Some of the calcified material was sent to pathology for analysis 11/17/15. The patient's wound on the right lateral leg is essentially unchanged. Wider Medially. 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 There is some surface slough but I did not debridement this. No evidence of  surrounding infection. 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 This will make healing difficult 12/15/15; the wound has less depth surface has a fibrinous slough and calcifications in the wound edge no evidence of infection 12/22/15; the wound definitely has less Fibrinous slough on the base. Calcifications around the wound edges are still evident. Although the wound bed looks healthier it is still pale in appearance. Previous biopsy did not show malignancy 01/04/15; surgical debridement of nonviable slough and subcutaneous tissue the wound cleans up quite nicely but appears to be expanding outward calcifications around the wound edges are still there. 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 She is to see her rheumatologist I'll see if he has any opinions on this. My punch biopsy done in September did not show calcifications although these are clearly evident. 01/19/16 light selective debridement of nonviable surface slough. There is epithelialization medially. This gives me reason for cautious optimism. She has been to see her rheumatologist, there is nothing that can be done for this type of soft tissue calcification associated with scleroderma 02/02/16 no debridement although there is a light surface slough. She has 2 peninsulas of skin 1 inferiorly and one medially. We continue to make a slight and slow but definite progressive here 02/16/16; light surface debridement with more attention to the circumference of the wound bed where the fibrinous eschar is more prevalent. No calcifications detected. She seems to have done nicely with the [facility] with some epithelialization and some improvement in the overall wound volume. 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  Continued attention to the circumference of the wound where the fibrinous eschar in calcinosis or prevalent. No calcifications were detected. She has continued improvement with Hydrofera Blue. The wound is  no longer as deep 03/15/16 surgical debridement done to remove surface escha Especially around the circumference of the wound where there is nonviable subcutaneous tissue. In spite of this there is considerable improvement in the overall dimensions and depth of the wound. Islands of epithelialization are seen especially medially inferiorly and superiorly to a lesser extent. She is using Hydrofera Blue at home 03/29/16; surgical debridement done to remove surface eschar and nonviable subcutaneous tissue. This cleans up quite nicely mention slightly larger in terms of length and width however depth is less 04/12/16; continued gradual improvement in terms of depth and the condition of the wound base. Debridement is done. Continuing long standing Hydrofera Blue at home with Kerlix Coban wraps 04/26/16; continued gradual improvement in terms of depth and management as well as condition of the wound base. Surgical debridement done she continues with Hydrofera Blue. 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 She initially came to this clinic venous insufficiency ulcers which have long since healed 05/17/16 continued improvement in terms of the depth and measurements of this wound although she has a tightly adherent fibrinous slough each time. We've been continued with long standing Hydrofera Blue which seems to done as well for this wound is many advanced treatment options. 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 She also has chronic venous insufficiency. She has an irritation on her lateral right ankle secondary to our wraps 05/10/16; wound appears to be smaller especially on the medial aspect and especially in the width. Wound was  debridement surface looks better. She is also been in the hospital apparently with anemia again she tells me she had an endoscopy. Since she got home after 3 days which I believe was sometime last week she has had an irritated painful area on the right lateral ankle surrounding the lateral malleolus 05/31/16; much more adherent surface slough today then recently although I don't think the dimensions of changed that much. A more aggressive debridement is required. The irritated area over her medial malleolus is more pruritic and painful and I don't think represents cellulitis 06/14/16; no major change in her wound dimensions however there is more tightly adherent surface slough which is disappointing. As well as she appears to have a new small area medially. Furthermore an irritated uncomfortable area on the lateral aspect of her right foot just below the lateral malleolus. 06/21/16; I'm seeing the patient back in follow-up for the new areas under her major wound on the right anterior leg. She has been using Hydrofera Blue to this area probably for several months now and although the dressings seem to be helping for quite a period of time I think things have stagnated lately. She comes in today with a relatively tight adherent surface slough and really no changes in the wound shape or dimension. The 2 small areas she had inferiorly are tiny but still open they seem improved this well. There is no uncontrolled edema and I don't think there is any evidence of cellulitis. 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 Patient has had vascular evaluations both venous and arterial. I have biopsied this area. There is no obvious infection. The worrisome thing today is that she seems to be developing areas of erythema and epithelial damage on the medial aspect of the right foot. Also to a lesser degree inferior  to  the actual wound itself. Again I see no obvious changes to suggest cellulitis however as this is the only treatable option I will probably give her antibiotics. 07/13/16 no major change in the lady's large anterior right leg wound. Still covered with a very tightly adherent surface slough which is difficult to debridement. There is less erythema around this, culture last week grew pseudomonas I gave her ciprofloxacin. The area on her lateral right malleolus looks better- 08/02/16 the patient's wounds continued to decline. Her original large anterior right leg wound looks deeper. Still adherent surface slough that is difficult to debridement. She has a small area just below this and a punched-out wound just below her lateral malleolus. In the meantime she is been in hospital with apparently an upper GI bleed on Plavix and aspirin. She is now just on Plavix she received 3 units of packed cells 08/23/16; since I last saw this 3 weeks ago, the open large area on her right leg looks about the same syrup. She has a small satellite lesion just underneath this. The area on her medial right ankle is now a deep necrotic wound. I attempted to debridement this however there is just too much pain. 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 She follows with vascular surgery and is scheduled for an angiogram in early September 09/06/16; the patient is going for an arteriogram tomorrow. Her original large wound on the right calf is about the same the satellite lesion underneath it is about the same however the area on her medial ankle is now deeper with exposed tendon. I am no longer attempting to debride these wounds 09/20/16; the patient has undergone a right femoral endarterectomy and Dacron patch angioplasty. This seems to have helped the flow in her right leg. 10/04/16; Arrived today for aggressive debridement of the wounds on her right calf the  original wound the one beneath it and a difficult area over her right lateral leg just above the lateral malleolus. 10/25/16; her 3 open wounds are about the same in terms of dimensions however the surface appears a lot healthier post debridement. Using Iodoflex 10/18/16 we have been using Iodoflex to her wounds which she tolerated with some difficulty. 10/11/16; has been using Santyl for a period of time with some improvement although again very adherent surface slough would prevent any attempted healing this. She has a original wound on the left calf, the satellite underneath that and the most recent wound on the right medial ankle. She has completed revascularization by Dr. early and has had venous ablation earlier. Want to go back to Iodoflex to see if week and get a healthier surface to this wound bed failing this I think she'll need to be taken to the OR and I am prepared to call Dr. Marla Roe to discuss this. She is obviously not a good candidate for general anesthesia however.; 11/08/16; I put her on Iodoflex last time to see if I can get the wound bed any healthier and unfortunately today still had tremendous surface slough. 11/15/16; 4 weeks' worth of Iodoflex with not much improvement. Debridement on the major wounds on her left anterior leg is easier however this does not maintain from week to week. The punched-out area on her medial right ankle 11/29/16; I attempted to change the patient last visit to [facility] however she states this burned and was very uncomfortable therefore we gave her permission to go back to  the eye out of complex which she already had at home. Also she noted a lot of pain and swelling on the lateral aspect of her leg before she traveled to [facility] for the holidays, I called her in doxycycline over the phone. This seems to have helped 12/06/16; Wounds unchanged by in large. Using Iodoflex 12/13/16; her wounds today actually looks somewhat  better. The area on the right lateral lower leg has reasonably healthy-looking granulation and perhaps as actually filled and a bit. Debridement of the 2 wounds on the medial calf is easier and post debridement appears to have a healthier base. We have referred her to Dr. Migdalia Dk for consideration of operative debridement 12/20/16; we have a quick note from Dr. Merri Ray who feels that the patient needs to be referred to an academic center/plastic surgeon. This is due to the complexity of the patient's medical issues as it applies to anything in the OR. We have been using Iodoflex 12/27/16; in general the wounds on the right leg are better in terms of the difficult to remove surface slough. She has been using Iodoflex. She is approved for Apligraf which I anticipated ordering in the next week or 2 when we get a better-looking surface 01/04/16 the deep wounds on the right leg generally look better. Both of them are debrided further surface slough. The area on the lateral right leg was not debrided. She is approved for Apligraf I think I'll probably order this either next week or the week after depending on the surface of the wounds superiorly. We have been using Iodoflex which will continue until then 01/11/16; the deep wounds on the right leg again have a surface slough that requires debridement. I've not been able to get the wound bed on either one of these wounds down to what would be acceptable for an advanced skin stab-like Apligraf. The area on the medial leg has been improving. We have been using hot Iodoflex to all wounds which seems to do the best at at least limiting the nonviable surface 01/24/17; we have continued Iodoflex and all her wound areas. Her debridement Gen. he is easier and the surface underneath this looks viable. Nevertheless these are large area wounds with exposed muscle at least on the anterior parts. We have ordered Apligraf's for 2 weeks from now. The patient will be  away next week 02/07/17; the patient was close to have first Apligraf today however we did not order one. I therefore replaced her Iodoflex. She essentially has 3 large punched- out areas on her right anterior leg and right medial ankle. 02/11/17; Apligraf #1 02/25/17; Apligraf #2. In general some improvement in the right medial ankle and right anterior leg wounds. The larger wound medially perhaps some better 03/11/17; Apligraf #3. In general continued improvement in the right medial ankle and the right anterior leg wounds. 03/25/17 Apligraf #4. In general continued improvement especially on the right medial ankle and the lower 04/08/17; Apligraf #5 in general continued improvement in all wound sites. 04/22/17; post Apligraf #5 her wound beds continued to look a lot better all of them up to the surface of the surrounding skin. Had a caramel-colored slough that I did not debrided in case there is residual Apligraf effect. The wounds are as good as I have seen these looking quite some time. 04/29/17; we applied [place] and last week after completing her Apligraf. Wounds look as though they've contracted somewhat although they have a nonviable surface which was problematic in the  past. Apparently she has been approved for further Apligraf's. We applied Iodosorb today after debridement. 05/06/17; we're fortunate enough today to be able to apply additional Apligraf approved by her insurance. In general all of her wounds look better Apligraf #6 05/24/17; Apligraf #7 continued improvement in all wounds o3 06/10/17 Apligraf #8. Continued improvement in the surface of all wounds. Not much of an improvement in dimensions as I might a follow 06/24/17 Apligraf #9 continued general improvement although not as much change in the wound areas I might of like. She has a new open area on the right anterior lateral ankle very small and superficial. She also has a necrotic wound on the tip of her right index finger probably  secondary to severe uncontrolled Raynaud's phenomenon. She is already on sildenafil and already seen her rheumatologist who gave her Keflex. 07/08/17; Apligraf #10. Generalized improvement although she has a small additional wound just medial to the major wound area. 07/22/17; after discussion we decided not to reorder any further Apligraf's although there is been considerable improvement with these it hasn't been recently. The major wound anteriorly looks better. Smaller wounds beneath this and the more recent one and laterally look about the same. The area on the right lateral lower leg looks about the same 07/29/17 this is a patient who is exceedingly complex. She has advanced scleroderma, crest syndrome including calcinosis, PAD status post revascularization, chronic venous insufficiency status post ablations. She initially presented to this clinic with wounds on her bilateral lower legs however these closed. More recently we have been dealing with a large open area superiorly on the right anterior leg, a smaller wound underneath this and then another one on the right medial lower extremity. These improve significantly with 10 Apligraf applications. Over the last 2-3 weeks we are making good progress with Hydrofera Blue and these seem to be making progress towards closure 08/19/17; wounds continued to make good improvement with Hydrofera Blue and episodic aggressive debridements 08/26/17; still using Hydrofera Blue. Good improvements 09/09/17; using Hydrofera Blue continued improvement. Area on the lateral part of her right leg has only a small remaining open area. The small inferior satellite region is for all intents and purposes closed. Her major wound also is come in in terms of depth and has advancing epithelialization. 09/16/17; using Hydrofera Blue with continued improvement. The smaller satellite wound. We've closed out today along with a new were satellite wound medially. The area on her medial  ankle is still open and her major wound is still open but making improvement. All using Hydrofera Blue. Currently 09/30/17; using Hydrofera Blue. Still a small open area on the lateral right ankle area and her original major wound seems to be making gradual and steady improvement. 10/14/17; still using Hydrofera Blue. Still too small open areas on the right lateral ankle. Her original major wound is horizontal and linear. The most problematic area paradoxically seems to be the area to the medial area wears I thought it would be the lateral. 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 10/28/17; still using Hydrofera Blue. Right lateral ankle has a very small open area superiorly on the most lateral part of the wound. Her original open wound has 2 open areas now separated by normal skin and we've redefined this. 11/11/17; still using Hydrofera Blue area and right lateral ankle continues to have a small open area on mostly the lateral part of the wound. Her original wound has 2 small  open areas now separated by a considerable amount of normal skin 11/28/17; the patient called in slightly before Thanksgiving to report pain and erythema above the wound on the right leg. In the past this is responded well to treatment for cellulitis and I gave her over the phone doxycycline. She stated this resulted in fairly abrupt improvement. We have been using Hydrofera Blue for a prolonged period of time to the larger wound anteriorly into the remaining wound on her right right lateral ankle. The latter is just about closed with only a small linear area and the bottom of the [place]. 12/02/17; use endoform and left the dressing on since last visit. There is no tenderness and no evidence of infection. 12/16/17; patient has been using Endoform but not making much progress. The 2 punched out open areas anteriorly which were the reminiscence of her major wound appear deeper.  The area on the lateral aspect of her right calf also appears deeper. Also she has a puzzling tender swelling above her wound on the right leg. This seems larger than last time. Just above her wounds there appears to be some fluctuance in this area it is not erythematous and there is no crepitus 12/30/17; patient has been using Endoform up until last week we used Hydrofera Blue. Ultrasound of the swelling above her 2 major wounds last time was negative for a fluid collection. I gave her cefaclor for the erythema and tenderness in this area which seems better. Unfortunately both punched out areas anteriorly and the area on her right medial lower leg appear deeper. In fact the lateral of the wounds anteriorly actually looks as though it has exposed tendon and/or muscle sheath. She is not systemically unwell. She is complaining of vaginitis type symptoms presumably Candida from her antibiotics. 01/06/18; we're using santyl. she has 2 punched out areas anteriorly which were initially part of a large wound. Unfortunately medially this is now open to tendon/muscle. All the wounds have the same adherent very difficult to debride surface. 01/20/18; 2 week follow-up using Santyl. She has the 2 punched out areas anteriorly which were initially part of her large surface wound there. Medially this still has exposed muscle. All of these have the same tightly adherent necrotic surface which requires debridement. PuraPly was not accepted by the patient's insurance however her insurance I think it changed therefore we are going to run Apligraf to gain 02/03/18; the patient has been using Santyl. The wound on the right lateral ankle looks improved and the 2 areas anteriorly on the right leg looks about the same. The medial one has exposed muscle. The lateral 1 requiresdebridement. We use PuraPly today for the first week 02/10/18; PuraPly #2. The patient has 3 wounds. The area on the right lateral ankle, 2 areas anteriorly  that were part of her original large wound in this area the medial one has exposed muscle. All of the wounds were lightly debrided with a number 3 curet. PuraPly #2 applied the lateral wound on the calf and the right lateral ankle look better. 02/17/18; PuraPly #3. Patient has 3 wounds. The area on the right lateral ankle in 2 areas internally that were part of her original large wound. The lateral area has exposed muscle. She arrived with some complaints of pain around the right ankle. 02/24/18; PuraPly #4; not much change in any of the 3 wound areas. Right lateral ankle, right lateral calf. Both of these required debridement with a #3 curet. She tolerates this marginally. The area  on the medial leg still has exposed muscle. Not much change in dimensions 03/03/18 PuraPly #5. The area on the medial ankle actually looks better however the 2 separate areas that were original parts of the larger right anterior leg wound look as though they're attempting to coalesce. 03/10/18; PuraPly #6. The area on the medial ankle actually continues to look and measures smaller however the 2 separate areas that were part of the original large wound on the right anterior leg have now coalesced. There hasn't been much improvement here. The lateral area actually has underlying exposed muscle 03/17/18-she is here in follow-up evaluation for ulcerations to the right lower extremity. She is voicing no complaints or concerns. She tolerated debridement. Puraply#7 placed 03/24/18; difficult right lower extremity ulcerations. PuraPly #8 place. She is been approved for [employer]. She did very well with Apligraf today however she is apparently reached her "lifetime max" 03/31/18; marginal improvement with PuraPly although her wounds looked as good as they have in several weeks today. Used TheraSkins #1 04/14/18 TheraSkin #2 today 04/28/18 TheraSkins #3. Wound slightly improved 05/12/18; TheraSkin skin #4. Wound response has been  variable. 05/27/18 TheraSkin #5. Generally improvement in all wound areas. I've also put her in 3 layer compression to help with the severe venous hypertension 06/09/18; patient is done quite well with the TheraSkins unfortunately we have no further applications. I also put her in 3 layer compression last week and that really seems to of helped. 06/16/18; we have been using silver collagen. Wounds are smaller. Still be open area to the muscle layer of her calf however even that is contracted somewhat. She tells me that at night sometimes she has pain on the right lateral calf at the site of her lower wound. Notable that I put her into 3 layer compression about 3 weeks ago. She states that she dangles her leg over the bed that makes it feel better but she does not describe claudication during the day ooShe is going to call her secondary insurance to see if they will continue to cover advanced treatment products I have reviewed her arterial studies from 01/22/17; this showed an ABI in the right of 1 and on the left noncompressible. TBI on the right at 0.30 on the left at 0.34. It is therefore possible she has significant PAD with medial calcification falsely elevating her ABI into the normal range. I'll need to be careful about asking her about this next week it's possible the 3 layer compression is too much 06/23/18; was able to reapply TheraSkin 1 today. Edema control is good and she is not complaining of pain no claudication 07/07/18;no major change. New wound which was apparently a taper removal injury today in our clinic between her 2 wounds on the right calf TheraSkins #2 07/14/18; I think there is some improvement in the right lateral ankle and the medial part of her wound. There is still exposed muscle medially. 07/28/18; two-week follow-up. TheraSkins#3. Unfortunately no major change. She is not a candidate I don't think for skin grafting due to severe venous hypertension associated with her  scleroderma and pulmonary hypertension 08/11/18 Patient is here today for her [employer] application #85 (#5 of the second set). She seems to be doing well and in the base of the wound appears to show some progress at this point. This is the last approved [employer] of the second set. 08/25/18; she has completed TheraSkin. There has been some improvement on the right lateral calf wound as well as the anterior  leg wounds. The open area to muscle medially on the anterior leg wound is smaller. I'm going to transition her back to [facility] under Kerlix Coban change every second day. She reports that she had some calcification removed from her right upper arm. We have had previous problems with calcifications in her wounds on her legs but that has not happened recently 09/08/18;using Hydrofera Blue on both her wound areas. Wounds seem to of contracted nicely. She uses Kerlix Coban wrap and changes at home herself 09/22/2018; using Hydrofera Blue on both her wound areas. Dimensions seem to have come down somewhat. There is certainly less depth in the medial part of the mid tibia wound and I do not think there is any exposed muscle at this point. 10/06/2018; 2-week follow-up. Using [facility] on her wound areas. Dimensions have come down nicely both on the right lateral ankle area in the right mid tibial area. She has no new complaints 10/20/2018; 2-week follow-up. She is using Hydrofera Blue. Not much change from the last time she was here. The area on the lateral ankle has less depth although it has raised edges on one side. I attempted to remove as much of the raised edge as I could without creating more additional wounds. The area on the right anterior mid tibia area looks the same. 11/03/2018; 2-week follow-up she is using Hydrofera Blue. On the right anterior leg she now has 2 wounds separated by a large area of normal skin. The area on the medial part still has I think exposed muscle although this  area itself is a lot smaller. The area laterally has some depth. Both areas with necrotic debris. ooThe area on her right lateral ankle has come in nicely 11/17/2018; patient continues to use Hydrofera Blue. We have been increasing separation of the 2 wounds anteriorly which were at one point joint the area on the right lateral calf continues to have I think some improvement in depth. 12/08/2018; patient continues to use Hydrofera Blue. There is some improvement in the area on the right lateral calf. The 2 areas that were initially part of the original large wound in the mid right tibia are probably about the same. In fact the medial area is probably somewhat larger. We will run puraply through the patient's insurance 12/22/2018; she has been using Hydrofera Blue. We have small wounds on the right lateral calf and 2 small areas that were initially part of a large wound in the right mid tibia. We applied pure apply #1 today. 12/29/2018; we applied puraply #2. Her wounds look somewhat better especially on the right lateral calf and the lateral part of her original wound in the mid tibial area. 01/05/2019; perhaps slightly improved in terms of wound bed condition but certainly not as much improvement as I might of liked. Puraply #3 1/13: we did not have a correct sized puraply to apply. wounds more pinched out looking, I increased her compression to 3 layer last week to help with significant multilevel venous hypertension. Since then I've reviewed her arterial status. She has a right femoral endarterectomy and a distal left SFA stent. She was being followed by Dr. [name] for a period however she does not appear to have seen him in 3 years. I will set up an appointment. 1/20. The patient has an additional wound on the right lateral calf between the distal wound and proximal wounds. We did not have Puraply last week. Still does not have a follow-up with Dr. early 1/27:  Follow-up with Dr. early has been  arranged apparently with follow-up noninvasive studies. Wounds are measuring roughly the same although they certainly look smaller 2/3; the patient had non invasive studies. Her ABIs on the right were 0.83 and on the left 1.02 however there was no great toe pressure bilaterally. Also worrisome monophasic waveforms at the PTA and dorsalis pedis. We are still using Puraply. We have had some improvement in all of the wounds especially the lateral part of the mid tibial area. 2/10; sees Dr. early of vein and vascular re-arterial studies next week. Puraply reapplied today. 2/17; Dr. early of vein and vascular his appointment is tomorrow. Puraply reapplied after debridement of all wounds 2/24; the patient saw Dr. early I reviewed his note. sHe noted the previous right femoral endarterectomy with a Dacron patch. He also noted the normal ABI and the monophasic waveforms suggesting tibial disease. Overall he did not feel that she had any evidence of arterial insufficiency that would impair her wound healing. He did note her venous disease as well. He suggested PRN follow-up. 3/2; I had the last puraply applied today. The original wounds over the mid tibia area are improved where is the area on the right lower leg is not 3/9; wounds are smaller especially in the right mid tibia perhaps slightly in the right lateral calf. We finished with puraply and went to endoform today 3/23; the patient arrives after 2 weeks. She has been using endoform. I think all of her wounds look slightly better which includes the area on the right lateral calf just above the right lateral malleolus and the 2 in the right mid tibia which were initially part of the same wound. 4/27; TELEHEALTH visit; the patient was seen for telehealth visit today with her consent in the middle of the worldwide epidemic. Since she was last here she called in for antibiotics with pain and tenderness around the area on the right medial ankle. I gave her  empiric doxycycline. She states this feels better. She is using endoform on both of these areas 5/11 TELEHEALTH; the patient was seen for telehealth visit today. She was accompanied at home by her husband. She has severe pulmonary hypertension accompanied scleroderma and in the face of the Covid epidemic cannot be safely brought into our clinic. We have been using endoform on her wound areas. There are essentially 3 wound areas now in the left mid tibia now 2 open areas that it one point were connected and one on the right lateral ankle just above the malleolus. 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 She cannot come to clinic. The wounds were reviewed today via telehealth. She has severe chronic venous hypertension which I think is centrally mediated. She has wounds on her right anterior tibia and right lateral ankle area. These are chronic. She has been using endoform. 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 We have been following her from telehealth. She has severe chronic venous hypertension which may be mostly centrally mediated secondary to right heart heart failure. She has wounds on her right anterior tibia and right lateral ankle these are chronic we have been using endoform 6/22; TELEHEALTH; this patient was seen today via telehealth. 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 Since the last time we had contact with this patient she developed some pain and erythema around the wound on her right lateral malleolus/ankle and we put in antibiotics for her. This is resulted in good improvement with  resolution of the erythema and tenderness. I changed her to silver alginate last time. We had been using endoform for an extended period of time 7/13; TELEHEALTH; this patient was seen today via telehealth. 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 She would continue to be at a prohibitive risk to be brought into our clinic unless this was absolutely necessary. These visits have been done with her approval as well as her husband. We have been using silver alginate to the areas on the right mid tibia and right lateral lower leg. 7/27 TELEHEALTH; patient was seen along with her husband today via telehealth. 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 We changed her to sample last visit. She has 2 areas a chronic wound on her right mid tibia and one just above her ankle. These were not the original wounds when she came into this clinic but she developed them during treatment 8/17; she comes in for her first face-to-face visit in a long period. She has a remaining area just medial to the right tibia which is the last open part of her large wound across this area. She also has an area on the right lateral lower leg. We prescribed Santyl last telehealth visit but they were concerned that this was making a deeper so they put silver alginate on it last week. Her husband changes the dressings. 8/31; using Santyl to the 2 wound areas some improvement in wound surfaces. Husband has surgery in 2 weeks we will put her out 3 weeks. Any of the advanced treatment options that I can think of that would be eligible for this wound would also cause her to have to come in weekly. The risk that the patient is just too high 9/21. Using Santyl to the 2 wound areas. Both of these are somewhat better although the medial mid tibia area still has exposed muscle. Lateral ankle requiring debridement.  Using Santyl 10/12; using Santyl to the 2 wound areas. One on the right lateral ankle and the other in the medial calf which still has exposed muscle. Both areas have come down in size and have better looking surfaces. She has made nice progress with santyl 11/2; we have been using Santyl to the 2 wound areas. Right lateral ankle and the other in the mid tibia area the medial part of this still has exposed muscle. 11/23/2019 on evaluation today patient appears to be doing about the same really with regard to her wounds. She is actually not very pleased with how things seem to be progressing at this point she tells me that she really has not noted much improvement unfortunately. With that being said there is no signs of active infection at this time. There is some slough buildup noted at this time which again along with some dry skin around the edges of the wound I think would benefit her to try to debride some of this away. Fortunately her pain is doing fairly well. She still has exposed muscle in the right medial/tibial area. 12/14; TELEHEALTH; she was changed to [facility]  Blue to the right calf wound and right lateral ankle wound when she was here last time. Unfortunately since then she had a fall with a pelvic fracture and a fracture of her wrist. She was apparently hospitalized for 5 days. I have not looked at her discharge summary. She apparently came out of the hospital with a blister on her right heel. She was seen today via telehealth by myself and our case manager. The patient and her husband were present. She has been using Hydrofera Blue at our direction from the last time she was in the clinic. There is been no major improvement in fact the areas appear deeper and with a less viable surface 01/04/2020; TELEHEALTH; the patient was seen today in accompaniment of her husband and our nurse. She has 3 open areas 1 on the right medial mid tibia, one on the right lateral ankle and a large eschared  pressure area on the right heel. We have been using Iodoflex to the 2 original wounds. 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 It is simply too perilous for her to be seen in any other way 1/26; TELEHEALTH; the patient was seen today in accompaniment of her husband and our RN one of our nurses. She still has the 3 open areas 1 on the right medial mid tibia which is the remanent of a more extensive wound in this area, one on the right lateral ankle and a large eschared pressure area on the right heel. We have been using Iodoflex to the 2 original wounds and a bed at 9 application to the eschared area on the heel 2/15; the first time we have seen this patient and then several months out of concern for the pandemic. She had a large horizontal wound in the mid tibia. Only the medial aspect of this is still open. Area on the lateral ankle is just about closed. She had a new pressure ulcer on the right heel which I have removed some of the eschar. We have been using Iodoflex which I will continue. The area in the mid tibia has a round circle in the middle of exposed muscle. I think we would have to use an advanced treatment product to stimulate granulation over this area. We will run this through her insurance. She is not eligible for plastic surgery for many reasons 3/1; TELEHEALTH. The patient was seen today by telehealth. 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 We have been using Iodoflex to the wound areas which include the right anterior mid tibia, right lateral ankle and the right heel. All of these were reviewed 3/8; the patient's wound just above the right ankle is closed. She still has a contracting black eschar on the heel where she had a pressure ulcer. The medial part of her original wound on the mid tibia has exposed muscle. We have made really made no progress in  this area although we have managed to get a lot of the original wound in this area to close. We used Apligraf #1 today 3/22; the patient's ankle wound remains closed. She still has a contracting black eschar on the heel although it seems to have less surface area. It still not clear whether there is any depth here. We used Apligraf #2 today in an attempt to get granulation over the exposed muscle and what is left of her mid tibia area. 4/5; everything is closed except  the medial aspect of the mid tibia wound, as well as the pressure injury on the right heel. She has been using Betadine to the right heel which has been gradually contracting. We used Apligraf #3 today. 4/19; Apligraf #4. Still a pressure area on the right heel she has been using Betadine superficial excoriated areas she has been applying salicylic acid to on the anterior leg and the thick horn on the leg just above her wound area 5/3; Apligraf #5. Unfortunately she came in today with a reopening of the area on the right lateral lower leg. Not much change in the wound we have been treating in the mid calf. Quite a bit of edema surrounding this wound. She reports that she was up on her feet quite a bit. The area on the right heel is separating she has been using Betadine She comes in with a new wound on the left lateral malleolus which is very disappointing this is been open since last week 5/17; we applied her last Apligraf last time. Unfortunately that did not have any effect on the deep area medially in the mid tibial area. However the area over the right lateral malleolus is a lot better. Right heel also is contracted we used Iodoflex last time. The new wound on the left lateral malleolus were using Santyl to. This required debridement. 6/1. Not much change in the area on the right medial mid tibia. Right lateral ankle is improved she has the necrotic area on the right heel which was a pressure area. New area on the left lateral  malleolus from last time. I changed her to Iodoflex to the area on the left lateral malleolus she said this hurt I have put her back on Santyl 6/15; right mid tibia is unchanged. I do not think there is anything I can do to this topically that we will get this to granulate over the muscle. And I am furthermore I am not sure that she is a surgical candidate either because of her severe pulmonary issues or because she really does not want to go through it. ooThe area on the right lateral malleolus is a small punched out area. ooThe area on the left lateral malleolus again small punched out with nonviable surface ooPressure ulcer on the right heel at separating black eschar. I went ahead and remove this today Lura Em has an area on the left anterior mid tibia just laterally. Geographic wounds debris on the surface. Some erythema here. 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 There is skin changes widely Change the primary dressing to Sorbact to all wound areas. She needs compression on the left leg as well as the right 6/21; right mid tibia which was her one remaining wound at 1 point is unchanged ooThe area on the right lateral malleolus actually is close to closing over still a small punched out area ooThe area on the left lateral malleolus again a deep wound it is this 1 that she feels pain in. ooPressure ulcer on the right heel was cultured today. ooNew area on the left anterior mid tibia several geographic wounds last week in the setting of chronic venous insufficiency this actually looks some better Objective Constitutional Patient is hypertensive.. Pulse regular and within target range for patient.Marland Kitchen Respirations regular, non-labored and within target range.. Temperature is normal and within the target range for the patient.Marland Kitchen Appears in no distress. Vitals Time Taken: 10:35 AM, Height: 68 in, Weight: 132 lbs, BMI: 20.1, Temperature: 98.4 F, Pulse:  85 bpm,  Respiratory Rate: 19 breaths/min, Blood Pressure: 167/68 mmHg. General Notes: Wound exam ooMidportion of the right tibia about the same still exposed muscle. We have made no progress here ooThe right lateral ankle wound is just about closed ooLeft lateral ankle is debrided with a #3 curette. Still not able to get to a viable surface here. ooRight heel also debrided of necrotic material. A culture done after this debridement. Fortunately I am able to get the surface of this to a better looking area ooThe worrisome area from last week on the left anterior lower leg mid aspect looks somewhat better there is less erythema. The wound surface carefully debrided with a #3 curette. They cleaned up quite nicely there is 3 of them in this area Integumentary (Hair, Skin) Wound #16 status is Open. Original cause of wound was Pressure Injury. The wound is located on the Right Calcaneus. The wound measures 1.1cm length x 1.6cm width x 0.9cm depth; 1.382cm^2 area and 1.244cm^3 volume. There is Fat Layer (Subcutaneous Tissue) Exposed exposed. There is no tunneling noted, however, there is undermining starting at 3:00 and ending at 9:00 with a maximum distance of 1cm. There is a small amount of serosanguineous drainage noted. The wound margin is well defined and not attached to the wound base. There is small (1-33%) pink granulation within the wound bed. There is a large (67- 100%) amount of necrotic tissue within the wound bed including Adherent Slough. Wound #19 status is Open. Original cause of wound was Gradually Appeared. The wound is located on the Right,Lateral Malleolus. The wound measures 0.5cm length x 0.3cm width x 0.1cm depth; 0.118cm^2 area and 0.012cm^3 volume. There is Fat Layer (Subcutaneous Tissue) Exposed exposed. There is no tunneling or undermining noted. There is a small amount of serous drainage noted. The wound margin is distinct with the outline attached to the wound base. There is small  (1-33%) pink granulation within the wound bed. There is a large (67-100%) amount of necrotic tissue within the wound bed including Adherent Slough. Wound #20 status is Open. Original cause of wound was Gradually Appeared. The wound is located on the Left,Lateral Malleolus. The wound measures 0.8cm length x 0.6cm width x 0.3cm depth; 0.377cm^2 area and 0.113cm^3 volume. There is Fat Layer (Subcutaneous Tissue) Exposed exposed. There is no tunneling or undermining noted. There is a small amount of serous drainage noted. The wound margin is well defined and not attached to the wound base. There is no granulation within the wound bed. There is a large (67-100%) amount of necrotic tissue within the wound bed including Adherent Slough. Wound #21 status is Open. Original cause of wound was Gradually Appeared. The wound is located on the Left,Lateral Lower Leg. The wound measures 3cm length x 0.8cm width x 0.1cm depth; 1.885cm^2 area and 0.188cm^3 volume. There is Fat Layer (Subcutaneous Tissue) Exposed exposed. There is no tunneling or undermining noted. There is a small amount of serous drainage noted. The wound margin is flat and intact. There is small (1-33%) pink granulation within the wound bed. There is a large (67-100%) amount of necrotic tissue within the wound bed including Adherent Slough. Wound #22 status is Open. Original cause of wound was Gradually Appeared. The wound is located on the Left,Distal,Lateral Lower Leg. The wound measures 0.6cm length x 0.6cm width x 0.1cm depth; 0.283cm^2 area and 0.028cm^3 volume. There is Fat Layer (Subcutaneous Tissue) Exposed exposed. There is no tunneling or undermining noted. There is a small amount of serous drainage  noted. The wound margin is distinct with the outline attached to the wound base. There is small (1-33%) pink granulation within the wound bed. There is a large (67-100%) amount of necrotic tissue within the wound bed including  Adherent Slough. Wound #5 status is Open. Original cause of wound was Gradually Appeared. The wound is located on the Right,Medial Lower Leg. The wound measures 2.1cm length x 1.9cm width x 0.3cm depth; 3.134cm^2 area and 0.94cm^3 volume. 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 There was a Excisional Skin/Subcutaneous Tissue Debridement with a total area of 1.76 sq cm performed by Ricard Dillon., MD. With the following instrument(s): Curette to remove Viable and Non-Viable tissue/material. Material removed includes Subcutaneous Tissue and Slough and. No specimens were taken. A time out was conducted at 11:30, prior to the start of the procedure. A Minimum amount of bleeding was controlled with Pressure. The procedure was tolerated well with a pain level of 0 throughout and a pain level of 0 following the procedure. Post Debridement Measurements: 1.1cm length x 1.6cm width x 0.9cm depth; 1.244cm^3 volume. Post debridement Stage noted as Unstageable/Unclassified. Character of Wound/Ulcer Post Debridement requires further debridement. Post procedure Diagnosis Wound #16: Same as Pre-Procedure Pre-procedure diagnosis of Wound #16 is a Pressure Ulcer located on the Right Calcaneus . There was a Three Layer Compression Therapy Procedure by Levan Hurst,  RN. Post procedure Diagnosis Wound #16: Same as Pre-Procedure Wound #19 Pre-procedure diagnosis of Wound #19 is a Venous Leg Ulcer located on the Right,Lateral Malleolus .Severity of Tissue Pre Debridement is: Fat layer exposed. There was a Excisional Skin/Subcutaneous Tissue Debridement with a total area of 0.15 sq cm performed by Ricard Dillon., MD. With the following instrument(s): Curette to remove Viable and Non-Viable tissue/material. Material removed includes Subcutaneous Tissue and Slough and. No specimens were taken. A time out was conducted at 11:30, prior to the start of the procedure. A Minimum amount of bleeding was controlled with Pressure. The procedure was tolerated well with a pain level of 0 throughout and a pain level of 0 following the procedure. Post Debridement Measurements: 0.6cm length x 0.3cm width x 0.1cm depth; 0.014cm^3 volume. Character of Wound/Ulcer Post Debridement requires further debridement. Severity of Tissue Post Debridement is: Fat layer exposed. Post procedure Diagnosis Wound #19: Same as Pre-Procedure Pre-procedure diagnosis of Wound #19 is a Venous Leg Ulcer located on the Right,Lateral Malleolus . There was a Three Layer Compression Therapy Procedure by Levan Hurst, RN. Post procedure Diagnosis Wound #19: Same as Pre-Procedure Wound #20 Pre-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 There was a Excisional Skin/Subcutaneous Tissue Debridement with a total area of 0.48 sq cm performed by Ricard Dillon., MD. With the following instrument(s): Curette to remove Viable and Non-Viable tissue/material. Material removed includes Subcutaneous Tissue and Slough and. No specimens were taken. A time  out was conducted at 11:30, prior to the start of the procedure. A Minimum amount of bleeding was controlled with Pressure. The procedure was  tolerated well with a pain level of 0 throughout and a pain level of 0 following the procedure. Post Debridement Measurements: 0.8cm length x 0.6cm width x 0.3cm depth; 0.113cm^3 volume. Character of Wound/Ulcer Post Debridement requires further debridement. Severity of Tissue Post Debridement is: Fat layer exposed. 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 There was a Three Layer Compression Therapy Procedure by Levan Hurst, RN. 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 There was a Excisional Skin/Subcutaneous Tissue Debridement with a total area of 2.4 sq cm performed by Ricard Dillon., MD. With the following instrument(s): Curette to remove Viable and Non-Viable tissue/material. Material removed includes Subcutaneous Tissue and Slough and. No specimens were taken. A time out was conducted at 11:30, prior to the start of the procedure. A Minimum amount of bleeding was controlled with Pressure. The procedure was tolerated well with a pain level of 0 throughout and a pain level of 0 following the procedure. Post Debridement Measurements: 3cm length x 0.8cm width x 0.1cm depth; 0.188cm^3 volume. Character of Wound/Ulcer Post Debridement requires further debridement. Severity of Tissue Post Debridement is: Fat layer exposed. 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 There was a Three Layer Compression Therapy Procedure by Levan Hurst, RN. Post procedure Diagnosis Wound #21: Same as Pre-Procedure Wound #22 Pre-procedure diagnosis of Wound #22 is a Venous Leg Ulcer located  on the Left,Distal,Lateral Lower Leg . There was a Three Layer Compression Therapy Procedure by Levan Hurst, RN. Post procedure Diagnosis Wound #22: Same as Pre-Procedure Wound #5 Pre-procedure diagnosis of Wound #5 is a Venous Leg Ulcer located on the Right,Medial Lower Leg . There was a Three Layer Compression Therapy Procedure by Levan Hurst, RN. Post procedure Diagnosis Wound #5: Same as Pre-Procedure Plan Follow-up Appointments: Return Appointment in 2 weeks. Nurse Visit: - Friday for rewrap Dressing Change Frequency: Do not change entire dressing for one week. Skin Barriers/Peri-Wound Care: Moisturizing lotion - both legs Wound Cleansing: May shower with protection. Primary Wound Dressing: Wound #16 Right Calcaneus: Cutimed Sorbact Wound #19 Right,Lateral Malleolus: Cutimed Sorbact Wound #20 Left,Lateral Malleolus: Cutimed Sorbact Wound #21 Left,Lateral Lower Leg: Cutimed Sorbact Wound #5 Right,Medial Lower Leg: Cutimed Sorbact Secondary Dressing: Wound #16 Right Calcaneus: Dry Gauze Heel Cup Wound #19 Right,Lateral Malleolus: Dry Gauze Wound #20 Left,Lateral Malleolus: Dry Gauze Wound #21 Left,Lateral Lower Leg: Dry Gauze Wound #5 Right,Medial Lower Leg: Dry Gauze Edema Control: 3 Layer Compression System - Bilateral Avoid standing for long periods of time Elevate legs to the level of the heart or above for 30 minutes daily and/or when sitting, a frequency of: - throughout the day Off-Loading: Turn and reposition every 2 hours Other: - float heels off of bed/chair with pillow under calves Laboratory ordered were: Anerobic culture - Right calcaneus #1 still using Sorbac to all wound areas 2. Should be back later in the week for a nurse check as she is traveling to [facility] next week. We will put her out 10 days at that point  3. How a lot of nonviable necrotic surface on the right heel this was a pressure ulcer initially I debrided this today and  cultured her. 4. The area that is punched out on her left lateral malleolus was also debrided today this is the wound that seems to cause her the most pain. We reviewed her arterial studies which were checked on the right last year they were normal. May need to formally check the area on the left Electronic Signature(s) Signed: 06/20/2020 6:03:41 PM By: Linton Ham MD Entered By: Linton Ham on 06/20/2020 13:12:36 -------------------------------------------------------------------------------- SuperBill Details Patient Name: Date of Service: BLA [name] 06/20/2020 Medical Record Number: [id number] Patient Account Number: [id number] Date of Birth/Sex: Treating RN: April 05, 1951 (69 y.o. 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 I87.331 Chronic venous hypertension (idiopathic) with ulcer and inflammation of right lower extremity L89.616 Pressure-induced deep tissue damage of right heel L97.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 L97.828 Non-pressure chronic ulcer of other part of left lower leg with other specified severity Facility Procedures CPT4 Code: [id number] L I Description: 00938 - DEB SUBQ TISSUE 20 SQ CM/< ICD-10 Diagnosis Description L97.828 Non-pressure chronic ulcer of other part of left lower leg with other specified 89.616 Pressure-induced deep tissue damage of right heel 83.222 Varicose veins of left  lower extremity with both ulcer of calf and inflammation Modifier: severity Quantity:  1 Physician Procedures : CPT4 Code Description Modifier [id number] 11042 - WC PHYS SUBQ TISS 20 SQ CM ICD-10 Diagnosis Description L97.828 Non-pressure chronic ulcer of other part of left lower leg with other specified severity L89.616 Pressure-induced deep tissue damage of right  heel 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

## 2020-06-21 NOTE — Progress Notes (Addendum)
[name], [name] ([id number]) Visit Report for 06/20/2020 Arrival Information Details Patient Name: Date of Service: [name] 06/20/2020 10:15 A M Medical Record Number: [id number] Patient Account Number: [id number] Date of Birth/Sex: Treating RN: 01/28/1951 (69 y.o. Hollie Salk, Larene [place] Primary Care Genella Bas: [name], [place] RY Other Clinician: Referring Madellyn Denio: Treating Salena Ortlieb/Extender: [name], MA RY Weeks in Treatment: 63 Visit Information History Since Last Visit Added or deleted any medications: No Patient Arrived: Ambulatory Any new allergies or adverse reactions: No Arrival Time: 10:33 Had a fall or experienced change in No Accompanied By: husband activities of daily living that may affect Transfer Assistance: None risk of falls: Patient Identification Verified: Yes Signs or symptoms of abuse/neglect since last visito No Secondary Verification Process Completed: Yes Hospitalized since last visit: No Patient Requires Transmission-Based Precautions: No Implantable device outside of the clinic excluding No Patient Has Alerts: No cellular tissue based products placed in the center since last visit: Has Dressing in Place as Prescribed: Yes Has Compression in Place as Prescribed: Yes Pain Present Now: No Electronic Signature(s) Signed: 06/20/2020 6:16:49 PM By: Kela Millin Entered By: Kela Millin on 06/20/2020 10:39:25 -------------------------------------------------------------------------------- Compression Therapy Details Patient Name: Date of Service: [name] 06/20/2020 10:15 A M Medical Record Number: [id number] Patient Account Number: [id number] Date of Birth/Sex: Treating RN: 1951-03-24 (69 y.o. [name] [name] Primary Care Yeraldy Spike: [name], [place] R[place] Other Clinician: Referring Langdon Crosson: Treating Beatrice Ziehm/Extender: [name], MA RY Weeks in Treatment: 430 Compression Therapy Performed for Wound  Assessment: Wound #16 Right Calcaneus Performed By: Clinician Levan Hurst, RN Compression Type: Three Layer Post Procedure Diagnosis Same as Pre-procedure Electronic Signature(s) Signed: 06/21/2020 5:24:50 PM By: Levan Hurst RN, BSN Entered By: Levan Hurst on 06/20/2020 11:40:27 -------------------------------------------------------------------------------- Compression Therapy Details Patient Name: Date of Service: [name] 06/20/2020 10:15 A M Medical Record Number: [id number] Patient Account Number: [id number] Date of Birth/Sex: Treating RN: 1951/02/09 (69 y.o. [name] [name] Primary Care Ninoska Goswick: [name], [place] R[place] Other Clinician: Referring Marcus Groll: Treating Limuel Nieblas/Extender: [name], MA RY Weeks in Treatment: 430 Compression Therapy Performed for Wound Assessment: Wound #19 Right,Lateral Malleolus Performed By: Clinician Levan Hurst, RN Compression Type: Three Layer Post Procedure Diagnosis Same as Pre-procedure Electronic Signature(s) Signed: 06/21/2020 5:24:50 PM By: Levan Hurst RN, BSN Entered By: Levan Hurst on 06/20/2020 11:40:31 -------------------------------------------------------------------------------- Compression Therapy Details Patient Name: Date of Service: [name] 06/20/2020 10:15 A M Medical Record Number: [id number] Patient Account Number: [id number] Date of Birth/Sex: Treating RN: 1951-08-08 (69 y.o. [name] [name] Primary Care Tayven Renteria: [name], [place] R[place] Other Clinician: Referring Avrian Delfavero: Treating Deasia Chiu/Extender: [name], MA RY Weeks in Treatment: 430 Compression Therapy Performed for Wound Assessment: Wound #20 Left,Lateral Malleolus Performed By: Clinician Levan Hurst, RN Compression Type: Three Layer Post Procedure Diagnosis Same as Pre-procedure Electronic Signature(s) Signed: 06/21/2020 5:24:50 PM By: Levan Hurst RN, BSN Entered By: Levan Hurst on  06/20/2020 11:40:31 -------------------------------------------------------------------------------- Compression Therapy Details Patient Name: Date of Service: [name] 06/20/2020 10:15 A M Medical Record Number: [id number] Patient Account Number: [id number] Date of Birth/Sex: Treating RN: 09-Jul-1951 (69 y.o. [name] [name] Primary Care Mahina Salatino: [name], [place] R[place] Other Clinician: Referring Heinrich Fertig: Treating Haidar Muse/Extender: [name], MA RY Weeks in Treatment: 430 Compression Therapy Performed for Wound Assessment: Wound #21 Left,Lateral Lower Leg Performed By: Cristino Martes,  Briant Cedar, RN Compression Type: Three Layer Post Procedure Diagnosis Same as Pre-procedure Electronic Signature(s) Signed: 06/21/2020 5:24:50 PM By: Levan Hurst RN, BSN Entered By: Levan Hurst on 06/20/2020 11:40:32 -------------------------------------------------------------------------------- Compression Therapy Details Patient Name: Date of Service: [name] 06/20/2020 10:15 A M Medical Record Number: [id number] Patient Account Number: [id number] Date of Birth/Sex: Treating RN: [birth year] (69 y.o. [name] [name] Primary Care Kemonie Cutillo: [name], [place] R[place] Other Clinician: Referring Jylan Loeza: Treating Dow Blahnik/Extender: [name], MA RY Weeks in Treatment: 430 Compression Therapy Performed for Wound Assessment: Wound #22 Left,Distal,Lateral Lower Leg Performed By: Clinician Levan Hurst, RN Compression Type: Three Layer Post Procedure Diagnosis Same as Pre-procedure Electronic Signature(s) Signed: 06/21/2020 5:24:50 PM By: Levan Hurst RN, BSN Entered By: Levan Hurst on 06/20/2020 11:40:32 -------------------------------------------------------------------------------- Compression Therapy Details Patient Name: Date of Service: [name] 06/20/2020 10:15 A M Medical Record Number: [id number] Patient Account Number:  [id number] Date of Birth/Sex: Treating RN: Apr 30, 1951 (69 y.o. [name] [name] Primary Care Alea Ryer: [name], [place] R[place] Other Clinician: Referring Tenesha Garza: Treating Jeidi Gilles/Extender: [name], MA RY Weeks in Treatment: 430 Compression Therapy Performed for Wound Assessment: Wound #5 Right,Medial Lower Leg Performed By: Clinician Levan Hurst, RN Compression Type: Three Layer Post Procedure Diagnosis Same as Pre-procedure Electronic Signature(s) Signed: 06/21/2020 5:24:50 PM By: Levan Hurst RN, BSN Entered By: Levan Hurst on 06/20/2020 11:40:32 -------------------------------------------------------------------------------- Encounter Discharge Information Details Patient Name: Date of Service: [name], [name] 06/20/2020 10:15 A M Medical Record Number: [id number] Patient Account Number: [id number] Date of Birth/Sex: Treating RN: 1951-07-23 (69 y.o. [name] Primary Care Meena Barrantes: Other Clinician: [name], [place] RY Referring Caidynce Muzyka: Treating Virgie Chery/Extender: [name], MA RY Weeks in Treatment: [phone] Encounter Discharge Information Items Post Procedure Vitals Discharge Condition: Stable Temperature (F): 98.4 Ambulatory Status: Ambulatory Pulse (bpm): 85 Discharge Destination: Home Respiratory Rate (breaths/min): 19 Transportation: Private Auto Blood Pressure (mmHg): 167/68 Accompanied By: husband Schedule Follow-up Appointment: Yes Clinical Summary of Care: Patient Declined Electronic Signature(s) Signed: 06/20/2020 6:16:49 PM By: Kela Millin Entered By: Kela Millin on 06/20/2020 11:54:06 -------------------------------------------------------------------------------- Lower Extremity Assessment Details Patient Name: Date of Service: [name] 06/20/2020 10:15 A M Medical Record Number: [id number] Patient Account Number: [id number] Date of Birth/Sex: Treating RN: 05-13-1951 (69 y.o. [name] Primary Care Renisha Cockrum: [name], [place] R[place] Other Clinician: Referring Teghan Philbin: Treating Olamae Ferrara/Extender: [name], MA RY Weeks in Treatment: 430 Edema Assessment Assessed: [Left: No] [Right: No] Edema: [Left: Yes] [Right: Yes] Calf Left: Right: Point of Measurement: 36 cm From Medial Instep 33 cm 34 cm Ankle Left: Right: Point of Measurement: 10 cm From Medial Instep 21 cm 24 cm Vascular Assessment Pulses: Dorsalis Pedis Palpable: [Left:Yes] [Right:Yes] Electronic Signature(s) Signed: 06/20/2020 6:16:49 PM By: Kela Millin Entered By: Kela Millin on 06/20/2020 10:46:40 -------------------------------------------------------------------------------- Multi Wound Chart Details Patient Name: Date of Service: [name] 06/20/2020 10:15 A M Medical Record Number: [id number] Patient Account Number: [id number] Date of Birth/Sex: Treating RN: Jan 28, 1951 (69 y.o. [name] [name] Primary Care Dontae Minerva: [name], [place] R[place] Other Clinician: Referring Anaiyah Anglemyer: Treating Twanda Stakes/Extender: [name], MA RY Weeks in Treatment: 430 Vital Signs Height(in): 68 Pulse(bpm): 85 Weight(lbs): 132 Blood Pressure(mmHg): 167/68 Body Mass Index(BMI): 20 Temperature(F): 98.4 Respiratory Rate(breaths/min): 19 Photos: [16:No Photos Right Calcaneus] [19:No Photos Right, Lateral Malleolus] [20:No Photos Left, Lateral Malleolus] Wound Location: [  16:Pressure Injury] [19:Gradually Appeared] [20:Gradually Appeared] Wounding Event: [16:Pressure Ulcer] [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] Primary Etiology: [16:Anemia, Hypertension, Peripheral] [19:Anemia, Hypertension, Peripheral] [20:Anemia, Hypertension, Peripheral] Comorbid History: [16:Arterial Disease, Peripheral Venous Disease, Raynauds, Scleroderma, Rheumatoid Arthritis, Osteoarthritis 01/04/2020] [19:Arterial Disease, Peripheral Venous Disease, Raynauds, Scleroderma, Rheumatoid  Arthritis, Osteoarthritis  05/02/2020] [20:Arterial Disease, Peripheral Venous Disease, Raynauds, Scleroderma, Rheumatoid Arthritis, Osteoarthritis 05/02/2020] Date Acquired: [16:24] [19:7] [20:7] Weeks of Treatment: [16:Open] [19:Open] [20:Open] Wound Status: [16:No] [19:No] [20:No] Clustered Wound: [16:N/A] [19:N/A] [20:N/A] Clustered Quantity: [16:1.1x1.6x0.9] [19:0.5x0.3x0.1] [20:0.8x0.6x0.3] Measurements L x W x D (cm) [16:1.382] [19:0.118] [20:0.377] A (cm) : rea [16:1.244] [19:0.012] [20:0.113] Volume (cm) : [16:89.00%] [19:16.30%] [20:14.30%] % Reduction in A rea: [16:1.00%] [19:57.10%] [20:-28.40%] % Reduction in Volume: [16:3] Starting Position 1 (o'clock): [16:9] Ending Position 1 (o'clock): [16:1] Maximum Distance 1 (cm): [16:Yes] [19:No] [20:No] Undermining: [16:Unstageable/Unclassified] [19:Full Thickness Without Exposed] [20:Unclassifiable] Classification: [16:Small] [19:Support Structures Small] [20:Small] Exudate Amount: [16:Serosanguineous] [19:Serous] [20:Serous] Exudate Type: [16:red, brown] [19:amber] [20:amber] Exudate Color: [16:Well defined, not attached] [19:Distinct, outline attached] [20:Well defined, not attached] Wound Margin: [16:Small (1-33%)] [19:Small (1-33%)] [20:None Present (0%)] Granulation Amount: [16:Pink] [19:Pink] [20:N/A] Granulation Quality: [16:Large (67-100%)] [19:Large (67-100%)] [20:Large (67-100%)] Necrotic Amount: [16:Fat Layer (Subcutaneous Tissue)] [19:Fat Layer (Subcutaneous Tissue)] [20:Fat Layer (Subcutaneous Tissue)] Exposed Structures: [16:Exposed: Yes Fascia: No Tendon: No Muscle: No Joint: No Bone: No None] [19:Exposed: Yes Fascia: No Tendon: No Muscle: No Joint: No Bone: No Small (1-33%)] [20:Exposed: Yes Fascia: No Tendon: No Muscle: No Joint: No Bone: No None] Epithelialization: [16:Debridement - Excisional] [19:Debridement - Excisional] [20:Debridement - Excisional] Debridement: Pre-procedure Verification/Time Out 11:30  [19:11:30] [20:11:30] Taken: [16:Subcutaneous, Slough] [19:Subcutaneous, Slough] [20:Subcutaneous, Slough] Tissue Debrided: [16:Skin/Subcutaneous Tissue] [19:Skin/Subcutaneous Tissue] [20:Skin/Subcutaneous Tissue] Level: [16:1.76] [19:0.15] [20:0.48] Debridement A (sq cm): [16:rea Curette] [19:Curette] [20:Curette] Instrument: [16:Minimum] [19:Minimum] [20:Minimum] Bleeding: [16:Pressure] [19:Pressure] [20:Pressure] Hemostasis A chieved: [16:0] [19:0] [20:0] Procedural Pain: [16:0] [19:0] [20:0] Post Procedural Pain: [16:Procedure was tolerated well] [19:Procedure was tolerated well] [20:Procedure was tolerated well] Debridement Treatment Response: [16:1.1x1.6x0.9] [19:0.6x0.3x0.1] [20:0.8x0.6x0.3] Post Debridement Measurements L x W x D (cm) [16:1.244] [19:0.014] [20:0.113] Post Debridement Volume: (cm) [16:Unstageable/Unclassified] [19:N/A] [20:N/A] Post Debridement Stage: [16:Compression Therapy] [19:Compression Therapy] [20:Compression Therapy] Procedures Performed: [16:Debridement 21] [19:Debridement 22] [20:Debridement 5] Photos: [16:No Photos Left, Lateral Lower Leg] [19:No Photos Left, Distal, Lateral Lower Leg] [20:No Photos Right, Medial Lower Leg] Wound Location: [16:Gradually Appeared] [19:Gradually Appeared] [20:Gradually Appeared] Wounding Event: [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] [19:Venous Leg Ulcer] [20:Venous Leg Ulcer] Primary Etiology: [16:Anemia, Hypertension, Peripheral] [19:Anemia, Hypertension, Peripheral] [20:N/A] Comorbid History: [16:Arterial Disease, Peripheral Venous Disease, Raynauds, Scleroderma, Rheumatoid Arthritis, Osteoarthritis 06/14/2020] [19:Arterial Disease, Peripheral Venous Disease, Raynauds, Scleroderma, Rheumatoid Arthritis, Osteoarthritis  06/20/2020] [20:01/13/2013] Date Acquired: [16:0] [19:0] [00:923] Weeks of Treatment: [16:Open] [19:Open] [20:Open] Wound Status: [16:Yes] [19:No] [20:Yes] Clustered Wound: [16:2] [19:N/A] [20:N/A] Clustered Quantity:  [16:3x0.8x0.1] [19:0.6x0.6x0.1] [20:2.1x1.9x0.3] Measurements L x W x D (cm) [16:1.885] [19:0.283] [20:3.134] A (cm) : rea [16:0.188] [19:0.028] [20:0.94] Volume (cm) : [16:-6.70%] [19:N/A] [20:-121.60%] % Reduction in Area: [16:-6.20%] [19:N/A] [20:-566.70%] % Reduction in Volume: [16:No] [19:No] [20:N/A] Undermining: [16:Full Thickness Without Exposed] [19:Full Thickness Without Exposed] [20:Full Thickness With Exposed Support] Classification: [16:Support Structures Small] [19:Support Structures Small] [20:Structures N/A] Exudate A mount: [16:Serous] [19:Serous] [20:N/A] Exudate Type: [16:amber] [19:amber] [20:N/A] Exudate Color: [16:Flat and Intact] [19:Distinct, outline attached] [20:N/A] Wound Margin: [16:Small (1-33%)] [19:Small (1-33%)] [20:N/A] Granulation A mount: [16:Pink] [19:Pink] [20:N/A] Granulation Quality: [16:Large (67-100%)] [19:Large (67-100%)] [20:N/A] Necrotic A mount: [16:Fat Layer (Subcutaneous Tissue)] [19:Fat Layer (Subcutaneous Tissue)] [20:N/A] Exposed Structures: [16:Exposed: Yes Fascia: No Tendon: No  Muscle: No Joint: No Bone: No Small (1-33%)] [19:Exposed: Yes Fascia: No Tendon: No Muscle: No Joint: No Bone: No None] [20:N/A] Epithelialization: [16:Debridement - Excisional] [19:N/A] [20:N/A] Debridement: Pre-procedure Verification/Time Out 11:30 [19:N/A] [20:N/A] Taken: [16:Subcutaneous, Slough] [19:N/A] [20:N/A] Tissue Debrided: [16:Skin/Subcutaneous Tissue] [19:N/A] [20:N/A] Level: [16:2.4] [19:N/A] [20:N/A] Debridement A (sq cm): [16:rea Curette] [19:N/A] [20:N/A] Instrument: [16:Minimum] [19:N/A] [20:N/A] Bleeding: [16:Pressure] [19:N/A] [20:N/A] Hemostasis A chieved: [16:0] [19:N/A] [20:N/A] Procedural Pain: [16:0] [19:N/A] [20:N/A] Post Procedural Pain: [16:Procedure was tolerated well] [19:N/A] [20:N/A] Debridement Treatment Response: [16:3x0.8x0.1] [19:N/A] [20:N/A] Post Debridement Measurements L x W x D (cm) [16:0.188] [19:N/A] [20:N/A] Post  Debridement Volume: (cm) [16:N/A] [19:N/A] [20:N/A] Post Debridement Stage: [16:Compression Therapy] [19:Compression Therapy] [20:Compression [place] Procedures Performed: [16:Debridement] Treatment Notes Wound #16 (Right Calcaneus) 1. Cleanse With Wound Cleanser Soap and water 2. Periwound Care Moisturizing lotion 3. Primary Dressing Applied Other primary dressing (specifiy in notes) 4. Secondary Dressing Dry Gauze 6. Support Layer Applied 3 layer compression wrap Notes cutimed sorbact Wound #19 (Right, Lateral Malleolus) 1. Cleanse With Wound Cleanser Soap and water 2. Periwound Care Moisturizing lotion 3. Primary Dressing Applied Other primary dressing (specifiy in notes) 4. Secondary Dressing Dry Gauze 6. Support Layer Applied 3 layer compression wrap Notes cutimed sorbact Wound #20 (Left, Lateral Malleolus) 1. Cleanse With Wound Cleanser Soap and water 2. Periwound Care Moisturizing lotion 3. Primary Dressing Applied Other primary dressing (specifiy in notes) 4. Secondary Dressing Dry Gauze 6. Support Layer Applied 3 layer compression wrap Notes cutimed sorbact Wound #21 (Left, Lateral Lower Leg) 1. Cleanse With Wound Cleanser Soap and water 2. Periwound Care Moisturizing lotion 3. Primary Dressing Applied Other primary dressing (specifiy in notes) 4. Secondary Dressing Dry Gauze 6. Support Layer Applied 3 layer compression wrap Notes cutimed sorbact Wound #22 (Left, Distal, Lateral Lower Leg) 1. Cleanse With Wound Cleanser Soap and water 2. Periwound Care Moisturizing lotion 3. Primary Dressing Applied Other primary dressing (specifiy in notes) 4. Secondary Dressing Dry Gauze 6. Support Layer Applied 3 layer compression wrap Notes cutimed sorbact Wound #5 (Right, Medial Lower Leg) 1. Cleanse With Wound Cleanser Soap and water 2. Periwound Care Moisturizing lotion 3. Primary Dressing Applied Other primary dressing (specifiy in  notes) 4. Secondary Dressing Dry Gauze 6. Support Layer Applied 3 layer compression wrap Notes cutimed Wellsite geologist) Signed: 06/20/2020 6:03:41 PM By: Linton Ham MD Signed: 06/21/2020 5:24:50 PM By: Levan Hurst RN, BSN Entered By: Linton Ham on 06/20/2020 13:02:05 -------------------------------------------------------------------------------- Pain Assessment Details Patient Name: Date of Service: [name] 06/20/2020 10:15 A M Medical Record Number: [id number] Patient Account Number: [id number] Date of Birth/Sex: Treating RN: [birth year] (69 y.o. [name] Primary Care Lotus Santillo: [name], [place] RY Other Clinician: Referring Neomia Herbel: Treating Maxi Carreras/Extender: [name], MA RY Weeks in Treatment: [phone] Active Problems Location of Pain Severity and Description of Pain Patient Has Paino No Site Locations Pain Management and Medication Current Pain Management: Electronic Signature(s) Signed: 06/20/2020 6:16:49 PM By: Kela Millin Entered By: Kela Millin on 06/20/2020 10:40:29 -------------------------------------------------------------------------------- Wound Assessment Details Patient Name: Date of Service: [name] 06/20/2020 10:15 A M Medical Record Number: [id number] Patient Account Number: [id number] Date of Birth/Sex: Treating RN: 20-Nov-1951 (69 y.o. [name] Primary Care Blinda Turek: [name], [place] RY Other Clinician: Referring Jemarcus Dougal: Treating Coleen Cardiff/Extender: [name], MA RY Weeks in Treatment: 430 Wound Status Wound Number: 16 Primary Pressure Ulcer Etiology: Wound Location: Right Calcaneus Wound Open Wounding Event:  Pressure Injury Status: Date Acquired: 01/04/2020 Comorbid Anemia, Hypertension, Peripheral Arterial Disease, Peripheral Weeks Of Treatment: 24 History: Venous Disease, Raynauds, Scleroderma, Rheumatoid Arthritis, Clustered Wound: No  Osteoarthritis Photos Wound Measurements Length: (cm) 1.1 Width: (cm) 1.6 Depth: (cm) 0.9 Area: (cm) 1.382 Volume: (cm) 1.244 % Reduction in Area: 89% % Reduction in Volume: 1% Epithelialization: None Tunneling: No Undermining: Yes Starting Position (o'clock): 3 Ending Position (o'clock): 9 Maximum Distance: (cm) 1 Wound Description Classification: Unstageable/Unclassified Wound Margin: Well defined, not attached Exudate Amount: Small Exudate Type: Serosanguineous Exudate Color: red, brown Foul Odor After Cleansing: No Slough/Fibrino Yes Wound Bed Granulation Amount: Small (1-33%) Exposed Structure Granulation Quality: Pink Fascia Exposed: No Necrotic Amount: Large (67-100%) Fat Layer (Subcutaneous Tissue) Exposed: Yes Necrotic Quality: Adherent Slough Tendon Exposed: No Muscle Exposed: No Joint Exposed: No Bone Exposed: No Treatment Notes Wound #16 (Right Calcaneus) 1. Cleanse With Wound Cleanser Soap and water 2. Periwound Care Moisturizing lotion 3. Primary Dressing Applied Other primary dressing (specifiy in notes) 4. Secondary Dressing Dry Gauze 6. Support Layer Applied 3 layer compression wrap Notes cutimed Wellsite geologist) Signed: 06/22/2020 5:20:20 PM By: Minerva Fester Signed: 06/23/2020 5:29:15 PM By: Kela Millin Previous Signature: 06/20/2020 6:16:49 PM Version By: Kela Millin Entered By: Minerva Fester on 06/22/2020 08:35:06 -------------------------------------------------------------------------------- Wound Assessment Details Patient Name: Date of Service: [name], [name] 06/20/2020 10:15 A M Medical Record Number: [id number] Patient Account Number: [id number] Date of Birth/Sex: Treating RN: [birth year] (69 y.o. [name] Primary Care Ginna Schuur: [name], [place] R[place] Other Clinician: Referring Rakiya Krawczyk: Treating Darcey Cardy/Extender: [name], MA RY Weeks in Treatment: 430 Wound Status Wound  Number: 19 Primary Venous Leg Ulcer Etiology: Wound Location: Right, Lateral Malleolus Wound Open Wounding Event: Gradually Appeared Status: Date Acquired: 05/02/2020 Comorbid Anemia, Hypertension, Peripheral Arterial Disease, Peripheral Weeks Of Treatment: 7 History: Venous Disease, Raynauds, Scleroderma, Rheumatoid Arthritis, Clustered Wound: No Osteoarthritis Photos Wound Measurements Length: (cm) 0.5 Width: (cm) 0.3 Depth: (cm) 0.1 Area: (cm) 0.118 Volume: (cm) 0.012 % Reduction in Area: 16.3% % Reduction in Volume: 57.1% Epithelialization: Small (1-33%) Tunneling: No Undermining: No Wound Description Classification: Full Thickness Without Exposed Support Structures Wound Margin: Distinct, outline attached Exudate Amount: Small Exudate Type: Serous Exudate Color: amber Foul Odor After Cleansing: No Slough/Fibrino Yes Wound Bed Granulation Amount: Small (1-33%) Exposed Structure Granulation Quality: Pink Fascia Exposed: No Necrotic Amount: Large (67-100%) Fat Layer (Subcutaneous Tissue) Exposed: Yes Necrotic Quality: Adherent Slough Tendon Exposed: No Muscle Exposed: No Joint Exposed: No Bone Exposed: No Treatment Notes Wound #19 (Right, Lateral Malleolus) 1. Cleanse With Wound Cleanser Soap and water 2. Periwound Care Moisturizing lotion 3. Primary Dressing Applied Other primary dressing (specifiy in notes) 4. Secondary Dressing Dry Gauze 6. Support Layer Applied 3 layer compression wrap Notes cutimed Wellsite geologist) Signed: 06/22/2020 5:20:20 PM By: Minerva Fester Signed: 06/23/2020 5:29:15 PM By: Kela Millin Previous Signature: 06/20/2020 6:16:49 PM Version By: Kela Millin Entered By: Minerva Fester on 06/22/2020 08:42:22 -------------------------------------------------------------------------------- Wound Assessment Details Patient Name: Date of Service: [name], [name] 06/20/2020 10:15 A M Medical Record Number:  [id number] Patient Account Number: [id number] Date of Birth/Sex: Treating RN: Jul 02, 1951 (69 y.o. [name] Primary Care Merville Hijazi: [name], [place] RY Other Clinician: Referring Ison Wichmann: Treating Raahim Shartzer/Extender: [name], MA RY Weeks in Treatment: 430 Wound Status Wound Number: 20 Primary Arterial Insufficiency Ulcer Etiology: Wound Location: Left, Lateral Malleolus Wound Open Wounding Event: Gradually Appeared Status: Date Acquired: 05/02/2020  Comorbid Anemia, Hypertension, Peripheral Arterial Disease, Peripheral Weeks Of Treatment: 7 History: Venous Disease, Raynauds, Scleroderma, Rheumatoid Arthritis, Clustered Wound: No Osteoarthritis Photos Wound Measurements Length: (cm) 0.8 Width: (cm) 0.6 Depth: (cm) 0.3 Area: (cm) 0.377 Volume: (cm) 0.113 % Reduction in Area: 14.3% % Reduction in Volume: -28.4% Epithelialization: None Tunneling: No Undermining: No Wound Description Classification: Unclassifiable Wound Margin: Well defined, not attached Exudate Amount: Small Exudate Type: Serous Exudate Color: amber Foul Odor After Cleansing: No Slough/Fibrino Yes Wound Bed Granulation Amount: None Present (0%) Exposed Structure Necrotic Amount: Large (67-100%) Fascia Exposed: No Necrotic Quality: Adherent Slough Fat Layer (Subcutaneous Tissue) Exposed: Yes Tendon Exposed: No Muscle Exposed: No Joint Exposed: No Bone Exposed: No Treatment Notes Wound #20 (Left, Lateral Malleolus) 1. Cleanse With Wound Cleanser Soap and water 2. Periwound Care Moisturizing lotion 3. Primary Dressing Applied Other primary dressing (specifiy in notes) 4. Secondary Dressing Dry Gauze 6. Support Layer Applied 3 layer compression wrap Notes cutimed Wellsite geologist) Signed: 06/22/2020 5:20:20 PM By: Minerva Fester Signed: 06/23/2020 5:29:15 PM By: Kela Millin Previous Signature: 06/20/2020 6:16:49 PM Version By: Kela Millin Entered  By: Minerva Fester on 06/22/2020 08:45:57 -------------------------------------------------------------------------------- Wound Assessment Details Patient Name: Date of Service: [name], [name] 06/20/2020 10:15 A M Medical Record Number: [id number] Patient Account Number: [id number] Date of Birth/Sex: Treating RN: 26-May-1951 (69 y.o. [name] Primary Care Kelley Polinsky: [name], [place] RY Other Clinician: Referring Estha Few: Treating Kayne Yuhas/Extender: [name], MA RY Weeks in Treatment: 430 Wound Status Wound Number: 21 Primary Venous Leg Ulcer Etiology: Wound Location: Left, Lateral Lower Leg Wound Open Wounding Event: Gradually Appeared Status: Date Acquired: 06/14/2020 Comorbid Anemia, Hypertension, Peripheral Arterial Disease, Peripheral Weeks Of Treatment: 0 History: Venous Disease, Raynauds, Scleroderma, Rheumatoid Arthritis, Clustered Wound: Yes Osteoarthritis Photos Wound Measurements Length: (cm) 3 Width: (cm) 0.8 Depth: (cm) 0.1 Clustered Quantity: 2 Area: (cm) 1.885 Volume: (cm) 0.188 % Reduction in Area: -6.7% % Reduction in Volume: -6.2% Epithelialization: Small (1-33%) Tunneling: No Undermining: No Wound Description Classification: Full Thickness Without Exposed Support Structures Wound Margin: Flat and Intact Exudate Amount: Small Exudate Type: Serous Exudate Color: amber Foul Odor After Cleansing: No Slough/Fibrino Yes Wound Bed Granulation Amount: Small (1-33%) Exposed Structure Granulation Quality: Pink Fascia Exposed: No Necrotic Amount: Large (67-100%) Fat Layer (Subcutaneous Tissue) Exposed: Yes Necrotic Quality: Adherent Slough Tendon Exposed: No Muscle Exposed: No Joint Exposed: No Bone Exposed: No Treatment Notes Wound #21 (Left, Lateral Lower Leg) 1. Cleanse With Wound Cleanser Soap and water 2. Periwound Care Moisturizing lotion 3. Primary Dressing Applied Other primary dressing (specifiy in notes) 4.  Secondary Dressing Dry Gauze 6. Support Layer Applied 3 layer compression wrap Notes cutimed Wellsite geologist) Signed: 06/22/2020 5:20:20 PM By: Minerva Fester Signed: 06/23/2020 5:29:15 PM By: Kela Millin Previous Signature: 06/20/2020 6:16:49 PM Version By: Kela Millin Entered By: Minerva Fester on 06/22/2020 08:48:23 -------------------------------------------------------------------------------- Wound Assessment Details Patient Name: Date of Service: [name], [name] 06/20/2020 10:15 A M Medical Record Number: [id number] Patient Account Number: [id number] Date of Birth/Sex: Treating RN: 1951/06/05 (69 y.o. [name] Primary Care Tianni Escamilla: [name], [place] RY Other Clinician: Referring Tamiko Leopard: Treating Sheriden Archibeque/Extender: [name], MA RY Weeks in Treatment: 430 Wound Status Wound Number: 22 Primary Venous Leg Ulcer Etiology: Wound Location: Left, Distal, Lateral Lower Leg Wound Open Wounding Event: Gradually Appeared Status: Date Acquired: 06/20/2020 Comorbid Anemia, Hypertension, Peripheral Arterial Disease, Peripheral Weeks Of Treatment: 0 History: Venous Disease,  Raynauds, Scleroderma, Rheumatoid Arthritis, Clustered Wound: No Osteoarthritis Photos Wound Measurements Length: (cm) 0.6 Width: (cm) 0.6 Depth: (cm) 0.1 Area: (cm) 0.283 Volume: (cm) 0.028 % Reduction in Area: 0% % Reduction in Volume: 0% Epithelialization: None Tunneling: No Undermining: No Wound Description Classification: Full Thickness Without Exposed Support Structu Wound Margin: Distinct, outline attached Exudate Amount: Small Exudate Type: Serous Exudate Color: amber res Foul Odor After Cleansing: No Slough/Fibrino Yes Wound Bed Granulation Amount: Small (1-33%) Exposed Structure Granulation Quality: Pink Fascia Exposed: No Necrotic Amount: Large (67-100%) Fat Layer (Subcutaneous Tissue) Exposed: Yes Necrotic Quality: Adherent  Slough Tendon Exposed: No Muscle Exposed: No Joint Exposed: No Bone Exposed: No Treatment Notes Wound #22 (Left, Distal, Lateral Lower Leg) 1. Cleanse With Wound Cleanser Soap and water 2. Periwound Care Moisturizing lotion 3. Primary Dressing Applied Other primary dressing (specifiy in notes) 4. Secondary Dressing Dry Gauze 6. Support Layer Applied 3 layer compression wrap Notes cutimed Wellsite geologist) Signed: 06/22/2020 5:20:20 PM By: Minerva Fester Signed: 06/23/2020 5:29:15 PM By: Kela Millin Previous Signature: 06/20/2020 6:16:49 PM Version By: Kela Millin Entered By: Minerva Fester on 06/22/2020 08:51:23 -------------------------------------------------------------------------------- Wound Assessment Details Patient Name: Date of Service: [name], [name] 06/20/2020 10:15 A M Medical Record Number: [id number] Patient Account Number: [id number] Date of Birth/Sex: Treating RN: 1951/09/24 (69 y.o. [name] Primary Care Tajha Sammarco: Other Clinician: [name], [place] RY Referring Nalina Yeatman: Treating Marnae Madani/Extender: [name], MA RY Weeks in Treatment: 430 Wound Status Wound Number: 5 Primary Venous Leg Ulcer Etiology: Wound Location: Right, Medial Lower Leg Wound Open Wounding Event: Gradually Appeared Status: Date Acquired: 01/13/2013 Comorbid Anemia, Hypertension, Peripheral Arterial Disease, Peripheral Weeks Of Treatment: 386 History: Venous Disease, Raynauds, Scleroderma, Rheumatoid Arthritis, Clustered Wound: Yes Osteoarthritis Photos Wound Measurements Length: (cm) 2.1 Width: (cm) 1.9 Depth: (cm) 0.3 Area: (cm) 3.134 Volume: (cm) 0.94 % Reduction in Area: -121.6% % Reduction in Volume: -566.7% Epithelialization: Small (1-33%) Wound Description Classification: Full Thickness With Exposed Support Structures Wound Margin: Flat and Intact Exudate Amount: Medium Exudate Type: Serosanguineous Exudate  Color: red, brown Foul Odor After Cleansing: No Slough/Fibrino Yes Wound Bed Granulation Amount: Small (1-33%) Exposed Structure Granulation Quality: Pink Fascia Exposed: No Necrotic Amount: Large (67-100%) Fat Layer (Subcutaneous Tissue) Exposed: Yes Necrotic Quality: Adherent Slough Tendon Exposed: No Muscle Exposed: No Joint Exposed: No Bone Exposed: No Treatment Notes Wound #5 (Right, Medial Lower Leg) 1. Cleanse With Wound Cleanser Soap and water 2. Periwound Care Moisturizing lotion 3. Primary Dressing Applied Other primary dressing (specifiy in notes) 4. Secondary Dressing Dry Gauze 6. Support Layer Applied 3 layer compression wrap Notes cutimed Wellsite geologist) Signed: 06/22/2020 5:20:20 PM By: Minerva Fester Signed: 06/23/2020 5:29:15 PM By: Kela Millin Signed: 06/23/2020 5:29:15 PM By: Kela Millin Previous Signature: 06/20/2020 6:16:49 PM Version By: Kela Millin Entered By: Minerva Fester on 06/22/2020 08:40:30 -------------------------------------------------------------------------------- Vitals Details Patient Name: Date of Service: [name], [name] 06/20/2020 10:15 A M Medical Record Number: [id number] Patient Account Number: [id number] Date of Birth/Sex: Treating RN: Dec 27, 1951 (69 y.o. [name] Primary Care Mekiah Cambridge: [name], [place] RY Other Clinician: Referring Arelia Volpe: Treating Jarrod Mcenery/Extender: [name], MA RY Weeks in Treatment: 430 Vital Signs Time Taken: 10:35 Temperature (F): 98.4 Height (in): 68 Pulse (bpm): 85 Weight (lbs): 132 Respiratory Rate (breaths/min): 19 Body Mass Index (BMI): 20.1 Blood Pressure (mmHg): 167/68 Reference Range: 80 - 120 mg / dl Electronic Signature(s) Signed: 06/20/2020 6:16:49 PM By: Verita Schneiders,  Larene Beach Entered By: Kela Millin on 06/20/2020 10:40:10

## 2020-06-22 ENCOUNTER — Other Ambulatory Visit (HOSPITAL_COMMUNITY): Payer: Self-pay | Admitting: Internal Medicine

## 2020-06-22 DIAGNOSIS — C44319 Basal cell carcinoma of skin of other parts of face: Secondary | ICD-10-CM | POA: Diagnosis not present

## 2020-06-23 ENCOUNTER — Other Ambulatory Visit (HOSPITAL_COMMUNITY): Payer: Self-pay | Admitting: *Deleted

## 2020-06-23 LAB — AEROBIC CULTURE W GRAM STAIN (SUPERFICIAL SPECIMEN)

## 2020-06-23 MED ORDER — TORSEMIDE 20 MG PO TABS
ORAL_TABLET | ORAL | 3 refills | Status: DC
Start: 2020-06-23 — End: 2020-08-16

## 2020-06-24 ENCOUNTER — Encounter (HOSPITAL_BASED_OUTPATIENT_CLINIC_OR_DEPARTMENT_OTHER): Payer: Medicare Other | Admitting: Internal Medicine

## 2020-06-24 DIAGNOSIS — I87331 Chronic venous hypertension (idiopathic) with ulcer and inflammation of right lower extremity: Secondary | ICD-10-CM | POA: Diagnosis not present

## 2020-06-24 NOTE — Progress Notes (Signed)
[name] [name], [name] [name] ([id number]) Visit Report for 06/24/2020 SuperBill Details Patient Name: Date of Service: [name] 06/24/2020 Medical Record Number: [id number] Patient Account Number: [id number] Date of Birth/Sex: Treating RN: [birth year] (69 y.o. 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 Electronic Signature(s) Signed: 06/24/2020 5:05:03 PM By: Deon Pilling Signed: 06/24/2020 5:27:17 PM By: Linton Ham MD Entered By: Deon Pilling on 06/24/2020 15:11:21

## 2020-06-24 NOTE — Progress Notes (Signed)
[name], [name] ([id number]) Visit Report for 06/24/2020 Arrival Information Details Patient Name: Date of Service: [name] 06/24/2020 1:00 PM Medical Record Number: [id number] Patient Account Number: [id number] Date of Birth/Sex: Treating RN: 12-Mar-1951 (69 y.o. [name], Meta.Reding Primary Care Rennie Hack: [name], [place] RY Other Clinician: Referring Tonesha Tsou: Treating Janney Priego/Extender: [name], MA RY Weeks in Treatment: 24 Visit Information History Since Last Visit Added or deleted any medications: No Patient Arrived: Wheel Chair Any new allergies or adverse reactions: No Arrival Time: 13:25 Had a fall or experienced change in No Accompanied By: self activities of daily living that may affect Transfer Assistance: None risk of falls: Patient Identification Verified: Yes Signs or symptoms of abuse/neglect since last visito No Secondary Verification Process Completed: Yes Hospitalized since last visit: No Patient Requires Transmission-Based Precautions: No Implantable device outside of the clinic excluding No Patient Has Alerts: No cellular tissue based products placed in the center since last visit: Has Dressing in Place as Prescribed: Yes Has Compression in Place as Prescribed: Yes Pain Present Now: No Electronic Signature(s) Signed: 06/24/2020 5:05:03 PM By: Deon Pilling Entered By: Deon Pilling on 06/24/2020 15:05:46 -------------------------------------------------------------------------------- Compression Therapy Details Patient Name: Date of Service: [name] [name] 06/24/2020 1:00 PM Medical Record Number: [id number] Patient Account Number: [id number] Date of Birth/Sex: Treating RN: 03-Sep-1951 (70 y.o. [name] [name] Primary Care Breianna Delfino: [name], [place] R[place] Other Clinician: Referring Treyson Axel: Treating Codey Burling/Extender: [name], MA RY Weeks in Treatment: 430 Compression Therapy Performed for Wound Assessment: Wound #16  Right Calcaneus Performed By: Clinician Deon Pilling, RN Compression Type: Three Layer Electronic Signature(s) Signed: 06/24/2020 5:05:03 PM By: Deon Pilling Entered By: Deon Pilling on 06/24/2020 15:07:29 -------------------------------------------------------------------------------- Compression Therapy Details Patient Name: Date of Service: [name] CKYolanda [name]. 06/24/2020 1:00 PM Medical Record Number: [id number] Patient Account Number: [id number] Date of Birth/Sex: Treating RN: [birth year] (69 y.o. [name] [name] Primary Care Kenadi Miltner: [name], [place] R[place] Other Clinician: Referring Dalexa Gentz: Treating Shamarcus Hoheisel/Extender: [name], MA RY Weeks in Treatment: 430 Compression Therapy Performed for Wound Assessment: Wound #19 Right,Lateral Malleolus Performed By: Clinician Deon Pilling, RN Compression Type: Three Layer Electronic Signature(s) Signed: 06/24/2020 5:05:03 PM By: Deon Pilling Entered By: Deon Pilling on 06/24/2020 15:07:29 -------------------------------------------------------------------------------- Compression Therapy Details Patient Name: Date of Service: [name] CKYolanda [name]. 06/24/2020 1:00 PM Medical Record Number: [id number] Patient Account Number: [id number] Date of Birth/Sex: Treating RN: 12-25-1951 (69 y.o. [name] [name] Primary Care Maya Arcand: [name], [place] R[place] Other Clinician: Referring Debralee Braaksma: Treating Marajade Lei/Extender: [name], MA RY Weeks in Treatment: 430 Compression Therapy Performed for Wound Assessment: Wound #20 Left,Lateral Malleolus Performed By: Clinician Deon Pilling, RN Compression Type: Three Layer Electronic Signature(s) Signed: 06/24/2020 5:05:03 PM By: Deon Pilling Entered By: Deon Pilling on 06/24/2020 15:07:29 -------------------------------------------------------------------------------- Compression Therapy Details Patient Name: Date of Service: [name] CKYolanda [name]. 06/24/2020 1:00 PM Medical  Record Number: [id number] Patient Account Number: [id number] Date of Birth/Sex: Treating RN: May 29, 1951 (69 y.o. [name] [name] Primary Care Fernando Stoiber: [name], [place] R[place] Other Clinician: Referring Mamoudou Mulvehill: Treating Abrianna Sidman/Extender: [name], MA RY Weeks in Treatment: 430 Compression Therapy Performed for Wound Assessment: Wound #21 Left,Lateral Lower Leg Performed By: Clinician Deon Pilling, RN Compression Type: Three Layer Electronic Signature(s) Signed: 06/24/2020 5:05:03 PM By: Deon Pilling Entered By: Deon Pilling on 06/24/2020 15:07:29 -------------------------------------------------------------------------------- Compression Therapy Details Patient Name:  Date of Service: [name], [name] [name]. 06/24/2020 1:00 PM Medical Record Number: [id number] Patient Account Number: [id number] Date of Birth/Sex: Treating RN: 1951-03-29 (69 y.o. [name] [name] Primary Care Felecia Stanfill: Other Clinician: [name], [place] R[place] Referring Jozsef Wescoat: Treating Eva Griffo/Extender: [name], MA RY Weeks in Treatment: 430 Compression Therapy Performed for Wound Assessment: Wound #22 Left,Distal,Lateral Lower Leg Performed By: Clinician Deon Pilling, RN Compression Type: Three Layer Electronic Signature(s) Signed: 06/24/2020 5:05:03 PM By: Deon Pilling Entered By: Deon Pilling on 06/24/2020 15:07:29 -------------------------------------------------------------------------------- Compression Therapy Details Patient Name: Date of Service: [name] CKYolanda [name]. 06/24/2020 1:00 PM Medical Record Number: [id number] Patient Account Number: [id number] Date of Birth/Sex: Treating RN: 1951/07/10 (69 y.o. [name] [name] Primary Care Sarha Bartelt: [name], [place] R[place] Other Clinician: Referring Hadrian Yarbrough: Treating Quantavius Humm/Extender: [name], MA RY Weeks in Treatment: 430 Compression Therapy Performed for Wound Assessment: Wound #5 Right,Medial Lower Leg Performed By:  Clinician Deon Pilling, RN Compression Type: Three Layer Electronic Signature(s) Signed: 06/24/2020 5:05:03 PM By: Deon Pilling Entered By: Deon Pilling on 06/24/2020 15:07:30 -------------------------------------------------------------------------------- Encounter Discharge Information Details Patient Name: Date of Service: [name], [name] 06/24/2020 1:00 PM Medical Record Number: [id number] Patient Account Number: [id number] Date of Birth/Sex: Treating RN: [birth year] (69 y.o. [name] [name] Primary Care Laqueta Bonaventura: [name], [place] R[place] Other Clinician: Referring Katiejo Gilroy: Treating Nivan Melendrez/Extender: [name], MA RY Weeks in Treatment: [phone] Encounter Discharge Information Items Discharge Condition: Stable Ambulatory Status: Wheelchair Discharge Destination: Home Transportation: Private Auto Accompanied By: self Schedule Follow-up Appointment: Yes Clinical Summary of Care: Electronic Signature(s) Signed: 06/24/2020 5:05:03 PM By: Deon Pilling Entered By: Deon Pilling on 06/24/2020 15:11:12 -------------------------------------------------------------------------------- Patient/Caregiver Education Details Patient Name: Date of Service: BLA [name] 6/25/2021andnbsp1:00 PM Medical Record Number: [id number] Patient Account Number: [id number] Date of Birth/Gender: Treating RN: March 10, 1951 (69 y.o. [name] [name] Primary Care Physician: [name], [place] R[place] Other Clinician: Referring Physician: Treating Physician/Extender: [name], MA RY Weeks in Treatment: [phone] Education Assessment Education Provided To: Patient Education Topics Provided Wound/Skin Impairment: Handouts: Skin Care Do's and Dont's Methods: Explain/Verbal Responses: Reinforcements needed Electronic Signature(s) Signed: 06/24/2020 5:05:03 PM By: Deon Pilling Entered By: Deon Pilling on 06/24/2020  15:10:59 -------------------------------------------------------------------------------- Wound Assessment Details Patient Name: Date of Service: [name] CKYolanda [name] 06/24/2020 1:00 PM Medical Record Number: [id number] Patient Account Number: [id number] Date of Birth/Sex: Treating RN: 09/12/1951 (69 y.o. [name], Meta.Reding Primary Care Kairon Shock: [name], [place] R[place] Other Clinician: Referring Danaija Eskridge: Treating Neosha Switalski/Extender: [name], MA RY Weeks in Treatment: 430 Wound Status Wound Number: 16 Primary Etiology: Pressure Ulcer Wound Location: Right Calcaneus Wound Status: Open Wounding Event: Pressure Injury Date Acquired: 01/04/2020 Weeks Of Treatment: 24 Clustered Wound: No Wound Measurements Length: (cm) 1.1 Width: (cm) 1.6 Depth: (cm) 0.9 Area: (cm) 1.382 Volume: (cm) 1.244 % Reduction in Area: 89% % Reduction in Volume: 1% Wound Description Classification: Unstageable/Unclassified Treatment Notes Wound #16 (Right Calcaneus) 1. Cleanse With Wound Cleanser Soap and water 2. Periwound Care Moisturizing lotion 3. Primary Dressing Applied Hydrogel or K-Y Jelly Other primary dressing (specifiy in notes) 4. Secondary Dressing Dry Gauze 6. Support Layer Applied 3 layer compression wrap Notes cutimed sorbact swab as primary dressings. Electronic Signature(s) Signed: 06/24/2020 5:05:03 PM By: Deon Pilling Entered By: Deon Pilling on 06/24/2020 15:06:37 -------------------------------------------------------------------------------- Wound Assessment Details Patient Name: Date of Service: [name], [name] A  N F. 06/24/2020 1:00 PM Medical Record Number: [id number] Patient Account Number: [id number] Date of Birth/Sex: Treating RN: June 26, 1951 (69 y.o. [name], Meta.Reding Primary Care Jenee Spaugh: [name], [place] R[place] Other Clinician: Referring Javone Ybanez: Treating Mycal Conde/Extender: [name], MA RY Weeks in Treatment: 430 Wound Status Wound Number:  19 Primary Etiology: Venous Leg Ulcer Wound Location: Right, Lateral Malleolus Wound Status: Open Wounding Event: Gradually Appeared Date Acquired: 05/02/2020 Weeks Of Treatment: 7 Clustered Wound: No Wound Measurements Length: (cm) 0.5 Width: (cm) 0.3 Depth: (cm) 0.1 Area: (cm) 0.118 Volume: (cm) 0.012 % Reduction in Area: 16.3% % Reduction in Volume: 57.1% Wound Description Classification: Full Thickness Without Exposed Support Structur es Treatment Notes Wound #19 (Right, Lateral Malleolus) 1. Cleanse With Wound Cleanser Soap and water 2. Periwound Care Moisturizing lotion 3. Primary Dressing Applied Hydrogel or K-Y Jelly Other primary dressing (specifiy in notes) 4. Secondary Dressing Dry Gauze 6. Support Layer Applied 3 layer compression wrap Notes cutimed sorbact swab as primary dressings. Electronic Signature(s) Signed: 06/24/2020 5:05:03 PM By: Deon Pilling Entered By: Deon Pilling on 06/24/2020 15:06:37 -------------------------------------------------------------------------------- Wound Assessment Details Patient Name: Date of Service: [name] 06/24/2020 1:00 PM Medical Record Number: [id number] Patient Account Number: [id number] Date of Birth/Sex: Treating RN: May 27, 1951 (69 y.o. [name], Meta.Reding Primary Care Darcey Cardy: [name], [place] R[place] Other Clinician: Referring Diera Wirkkala: Treating Adynn Caseres/Extender: [name], MA RY Weeks in Treatment: 430 Wound Status Wound Number: 20 Primary Etiology: Arterial Insufficiency Ulcer Wound Location: Left, Lateral Malleolus Wound Status: Open Wounding Event: Gradually Appeared Date Acquired: 05/02/2020 Weeks Of Treatment: 7 Clustered Wound: No Wound Measurements Length: (cm) 0.8 Width: (cm) 0.6 Depth: (cm) 0.3 Area: (cm) 0.377 Volume: (cm) 0.113 % Reduction in Area: 14.3% % Reduction in Volume: -28.4% Wound Description Classification: Unclassifiable Treatment Notes Wound #20  (Left, Lateral Malleolus) 1. Cleanse With Wound Cleanser Soap and water 2. Periwound Care Moisturizing lotion 3. Primary Dressing Applied Hydrogel or K-Y Jelly Other primary dressing (specifiy in notes) 4. Secondary Dressing Dry Gauze 6. Support Layer Applied 3 layer compression wrap Notes cutimed sorbact swab as primary dressings. Electronic Signature(s) Signed: 06/24/2020 5:05:03 PM By: Deon Pilling Entered By: Deon Pilling on 06/24/2020 15:06:37 -------------------------------------------------------------------------------- Wound Assessment Details Patient Name: Date of Service: [name] 06/24/2020 1:00 PM Medical Record Number: [id number] Patient Account Number: [id number] Date of Birth/Sex: Treating RN: 11-08-1951 (69 y.o. [name], Meta.Reding Primary Care Exzavier Ruderman: [name], [place] R[place] Other Clinician: Referring Tyliek Timberman: Treating Nohealani Medinger/Extender: [name], MA RY Weeks in Treatment: 430 Wound Status Wound Number: 21 Primary Etiology: Venous Leg Ulcer Wound Location: Left, Lateral Lower Leg Wound Status: Open Wounding Event: Gradually Appeared Date Acquired: 06/14/2020 Weeks Of Treatment: 1 Clustered Wound: Yes Wound Measurements Length: (cm) 3 Width: (cm) 0.8 Depth: (cm) 0.1 Area: (cm) 1.885 Volume: (cm) 0.188 % Reduction in Area: -6.7% % Reduction in Volume: -6.2% Wound Description Classification: Full Thickness Without Exposed Support Structur es Treatment Notes Wound #21 (Left, Lateral Lower Leg) 1. Cleanse With Wound Cleanser Soap and water 2. Periwound Care Moisturizing lotion 3. Primary Dressing Applied Hydrogel or K-Y Jelly Other primary dressing (specifiy in notes) 4. Secondary Dressing Dry Gauze 6. Support Layer Applied 3 layer compression wrap Notes cutimed sorbact swab as primary dressings. Electronic Signature(s) Signed: 06/24/2020 5:05:03 PM By: Deon Pilling Entered By: Deon Pilling on 06/24/2020  15:06:37 -------------------------------------------------------------------------------- Wound Assessment Details Patient Name: Date of Service: [name], [name] A N  F. 06/24/2020 1:00 PM Medical Record Number: [id number] Patient Account Number: [id number] Date of Birth/Sex: Treating RN: 25-Sep-1951 (69 y.o. [name], Meta.Reding Primary Care Ysidra Sopher: [name], [place] R[place] Other Clinician: Referring Niralya Ohanian: Treating Buford Gayler/Extender: [name], MA RY Weeks in Treatment: 430 Wound Status Wound Number: 22 Primary Etiology: Venous Leg Ulcer Wound Location: Left, Distal, Lateral Lower Leg Wound Status: Open Wounding Event: Gradually Appeared Date Acquired: 06/20/2020 Weeks Of Treatment: 0 Clustered Wound: No Wound Measurements Length: (cm) 0.6 Width: (cm) 0.6 Depth: (cm) 0.1 Area: (cm) 0.283 Volume: (cm) 0.028 % Reduction in Area: 0% % Reduction in Volume: 0% Wound Description Classification: Full Thickness Without Exposed Support Structur es Treatment Notes Wound #22 (Left, Distal, Lateral Lower Leg) 1. Cleanse With Wound Cleanser Soap and water 2. Periwound Care Moisturizing lotion 3. Primary Dressing Applied Hydrogel or K-Y Jelly Other primary dressing (specifiy in notes) 4. Secondary Dressing Dry Gauze 6. Support Layer Applied 3 layer compression wrap Notes cutimed sorbact swab as primary dressings. Electronic Signature(s) Signed: 06/24/2020 5:05:03 PM By: Deon Pilling Entered By: Deon Pilling on 06/24/2020 15:06:37 -------------------------------------------------------------------------------- Wound Assessment Details Patient Name: Date of Service: [name] 06/24/2020 1:00 PM Medical Record Number: [id number] Patient Account Number: [id number] Date of Birth/Sex: Treating RN: [birth year] (69 y.o. [name], Meta.Reding Primary Care Mallerie Blok: [name], [place] R[place] Other Clinician: Referring Soila Printup: Treating Naheem Mosco/Extender: [name],  MA RY Weeks in Treatment: 430 Wound Status Wound Number: 5 Primary Etiology: Venous Leg Ulcer Wound Location: Right, Medial Lower Leg Wound Status: Open Wounding Event: Gradually Appeared Date Acquired: 01/13/2013 Weeks Of Treatment: 387 Clustered Wound: Yes Wound Measurements Length: (cm) 2.1 Width: (cm) 1.9 Depth: (cm) 0.3 Area: (cm) 3.134 Volume: (cm) 0.94 % Reduction in Area: -121.6% % Reduction in Volume: -566.7% Wound Description Classification: Full Thickness With Exposed Support Structures Treatment Notes Wound #5 (Right, Medial Lower Leg) 1. Cleanse With Wound Cleanser Soap and water 2. Periwound Care Moisturizing lotion 3. Primary Dressing Applied Hydrogel or K-Y Jelly Other primary dressing (specifiy in notes) 4. Secondary Dressing Dry Gauze 6. Support Layer Applied 3 layer compression wrap Notes cutimed sorbact swab as primary dressings. Electronic Signature(s) Signed: 06/24/2020 5:05:03 PM By: Deon Pilling Entered By: Deon Pilling on 06/24/2020 15:06:37 -------------------------------------------------------------------------------- Vitals Details Patient Name: Date of Service: [name], [name] 06/24/2020 1:00 PM Medical Record Number: [id number] Patient Account Number: [id number] Date of Birth/Sex: Treating RN: 09/21/1951 (69 y.o. [name], Meta.Reding Primary Care Keyana Guevara: [name], [place] R[place] Other Clinician: Referring Kaisyn Millea: Treating Korynne Dols/Extender: [name], MA RY Weeks in Treatment: 430 Vital Signs Time Taken: 13:25 Temperature (F): 98.5 Height (in): 68 Pulse (bpm): 70 Weight (lbs): 132 Respiratory Rate (breaths/min): 20 Body Mass Index (BMI): 20.1 Blood Pressure (mmHg): 169/65 Reference Range: 80 - 120 mg / dl Electronic Signature(s) Signed: 06/24/2020 5:05:03 PM By: Deon Pilling Entered By: Deon Pilling on 06/24/2020 15:05:58

## 2020-06-30 ENCOUNTER — Other Ambulatory Visit: Payer: Self-pay | Admitting: Internal Medicine

## 2020-07-05 ENCOUNTER — Encounter (HOSPITAL_BASED_OUTPATIENT_CLINIC_OR_DEPARTMENT_OTHER): Payer: Medicare Other | Attending: Internal Medicine | Admitting: Internal Medicine

## 2020-07-05 DIAGNOSIS — L97811 Non-pressure chronic ulcer of other part of right lower leg limited to breakdown of skin: Secondary | ICD-10-CM | POA: Diagnosis not present

## 2020-07-05 DIAGNOSIS — L89616 Pressure-induced deep tissue damage of right heel: Secondary | ICD-10-CM | POA: Diagnosis not present

## 2020-07-05 DIAGNOSIS — I83222 Varicose veins of left lower extremity with both ulcer of calf and inflammation: Secondary | ICD-10-CM | POA: Diagnosis not present

## 2020-07-05 DIAGNOSIS — I890 Lymphedema, not elsewhere classified: Secondary | ICD-10-CM | POA: Insufficient documentation

## 2020-07-05 DIAGNOSIS — M1990 Unspecified osteoarthritis, unspecified site: Secondary | ICD-10-CM | POA: Diagnosis not present

## 2020-07-05 DIAGNOSIS — D649 Anemia, unspecified: Secondary | ICD-10-CM | POA: Insufficient documentation

## 2020-07-05 DIAGNOSIS — Z9981 Dependence on supplemental oxygen: Secondary | ICD-10-CM | POA: Insufficient documentation

## 2020-07-05 DIAGNOSIS — I87331 Chronic venous hypertension (idiopathic) with ulcer and inflammation of right lower extremity: Secondary | ICD-10-CM | POA: Diagnosis not present

## 2020-07-05 DIAGNOSIS — L97828 Non-pressure chronic ulcer of other part of left lower leg with other specified severity: Secondary | ICD-10-CM | POA: Diagnosis not present

## 2020-07-05 DIAGNOSIS — I739 Peripheral vascular disease, unspecified: Secondary | ICD-10-CM | POA: Diagnosis not present

## 2020-07-05 DIAGNOSIS — M069 Rheumatoid arthritis, unspecified: Secondary | ICD-10-CM | POA: Insufficient documentation

## 2020-07-05 DIAGNOSIS — Z7902 Long term (current) use of antithrombotics/antiplatelets: Secondary | ICD-10-CM | POA: Diagnosis not present

## 2020-07-05 DIAGNOSIS — I110 Hypertensive heart disease with heart failure: Secondary | ICD-10-CM | POA: Insufficient documentation

## 2020-07-05 DIAGNOSIS — I509 Heart failure, unspecified: Secondary | ICD-10-CM | POA: Insufficient documentation

## 2020-07-05 DIAGNOSIS — L97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: Secondary | ICD-10-CM | POA: Insufficient documentation

## 2020-07-05 DIAGNOSIS — L97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: Secondary | ICD-10-CM | POA: Insufficient documentation

## 2020-07-06 NOTE — Progress Notes (Signed)
[name], [name] ([id number]) Visit Report for 07/05/2020 Debridement Details Patient Name: Date of Service: [name] LO [name] [name] 07/05/2020 3:30 PM Medical Record Number: [id number] Patient Account Number: [id number] Date of Birth/Sex: Treating RN: November 03, 1951 (69 y.o. 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 Performed By: Physician Ricard Dillon., MD Debridement Type: Debridement Level of Consciousness (Pre-procedure): Awake and Alert Pre-procedure Verification/Time Out Yes - 16:33 Taken: Start Time: 16:33 T Area Debrided (L x W): otal 0.6 (cm) x 1.5 (cm) = 0.9 (cm) Tissue and other material debrided: Viable, Non-Viable, Slough, Subcutaneous, Slough Level: Skin/Subcutaneous Tissue Debridement Description: Excisional Instrument: Curette Bleeding: Minimum Hemostasis Achieved: Pressure End Time: 16:35 Procedural Pain: 0 Post Procedural Pain: 0 Response to Treatment: Procedure was tolerated well Level of Consciousness (Post- Awake and Alert procedure): Post Debridement Measurements of Total Wound Length: (cm) 0.6 Stage: Unstageable/Unclassified Width: (cm) 1.5 Depth: (cm) 0.7 Volume: (cm) 0.495 Character of Wound/Ulcer Post Debridement: Improved Post Procedure Diagnosis Same as Pre-procedure Electronic Signature(s) Signed: 07/05/2020 5:09:59 PM By: Linton Ham MD Signed: 07/05/2020 5:26:11 PM By: Levan Hurst RN, BSN Entered By: Linton Ham on 07/05/2020 16:53:29 -------------------------------------------------------------------------------- HPI Details Patient Name: Date of Service: [name] [name], [name] 07/05/2020 3:30 PM Medical Record Number: [id number] Patient Account Number: [id number] Date of Birth/Sex: Treating RN: 14-Jun-1951 (69 y.o. [name] [name] Primary  Care Provider: [name], [place] RY Other Clinician: Referring Provider: Treating Provider/Extender: [name], MA RY Weeks in Treatment: [phone] History of Present Illness HPI Description: this is a patient who initially came to [place] for wounds on the medial malleoli bilaterally as well as her upper medial lower extremities bilaterally. These wounds eventually healed with assistance of Apligraf's bilaterally. While this was occurring she developed the current wound which opened into a fairly substantial wound on the right lower extremity. These are mostly secondary to venous stasis physiology however the patient also has underlying scleroderma, pulmonary hypertension. The wound has been making good progress lately with the Hydrofera Blue-based dressing. 03/17/2015; patient had a arterial evaluation a year ago. Her right ABI was 0.86 left was 1.0. T brachial index was 0.41 on the right 0.45 on the left. Her oe bilateral common femoral artery waveforms were triphasic. Her white popliteal posterior tibial artery and anterior tibial artery waveforms were monophasic with good amplitude. Luteal artery waveforms were biphasic it was felt that her bilateral great toe pressures are of normal although adequate for tissue healing. 03/24/2015. The condition of this wound is not really improved that. He was covered with as fibrinous surface slough and eschar. This underwent an aggressive debridement with both a curette and scalpel. I still cannot really get down to what I can would consider to be a viable surface. There is no evidence of infection. Previous workup for ischemia roughly a year ago was negative nevertheless I think that continues to be a concern 04/07/15. The patient arrived for application of second Apligraf. Once again the surface of this wound is certainly less viable than I would like for an advanced treatment option. An aggressive debridement was done. She developed some arteriolar bleeding  which required that along pressure and silver alginate. 04/21/15: change in the condition of this wound. Once again it is covered in a gelatinous surface slough. After debridement today surface of the wound looks somewhat  better but now a heeling surface 05/05/15 Apligraf #4 applied.Still a lot of slough on this wound. 05/19/15 Apligraf #5 applied. Still a lot of slough on this wound I did not aggressively remove this 06/02/15; continued copious amounts of surface slough. This debridement fairly easily. 06/08/2015 -- the last time she had a venous duplex study done was over 3 years ago and the surgery was prior to that. I have recommended that she sees Dr. early for a another opinion regarding a repeat venous duplex and possibly more endovenous ablation of vein stripping of micro-phlebotomies. 06/16/15; wound has a gelatinous surface eschar that the debridement fairly easily to a point. I don't disagree with the venous workup and perhaps even arterial re-evaluation. She is on prednisone 5 mg and continue his medications for her pulmonary artery hypertension I am not sure if the latter have any wound care healing issues I would need to investigate this. 06/23/15 continues with a gelatinous surface eschar with of fibrinous underlying. What I can see of this wound does not look unhealthy however I just can't get this material which I think is 2 different layers off. Empiric culture done 06/30/15; unfortunately not a lot of change in this wound. A gelatinous surface eschar is easily removed however it has a tight fibrinous surface underneath the. 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 The wound comes back and basically and unchanged state the. She has a gelatinous surface that is more easily removed however there is a tightly fibrinous surface underneath the. There is no evidence of infection. She has a vascular follow-up next month. I would have to inject her in order to do a more aggressive  debridement of this area 07/21/15 the wound is roughly in the same state albeit the debridement is done with greater ease. There is less of the fibrinous eschar underneath the. There is no evidence of infection. She has follow-up with vascular surgery next week. No evidence of surrounding infection. Her original distal wounds healed while this one formed. 07/28/15; wound is easier to debride. No wound erythema. She is seeing Dr. [name] next week. 08/18/15 Has been to [place] for repeat ablation. Have been using medihoney pad with some improvement 08/25/15; absolutely no change in the condition of this wound in either its overall size or surface condition in many months now. At one point I had this down to [place] healthier surface I think with [facility] however this did not actually progress towards closure. Do not believe that the wound has actually deteriorated in terms of volume at all. We have been using a medihoney pad which allows easier debridement of the gelatinous eschar but again no overall actual improvement. the patient is going towards an ablation with pain and vascular which I think is scheduled for next week. The only other investigation that I could first see would be a biopsy. She does have underlying scleroderma 09/02/15 eschar is much easier to debridement however the base of this does not look particularly vibrant. We changed to Iodoflex. The patient had her ablation earlier this week 09/09/15 again the debridement over the base of this wound is easier and the base of the wound looks considerably better. We will continue the Iodoflex. Dr. Tawni Millers has expressed his satisfaction with the result of her ablation 09/15/15 once again the wound is relatively free of surface eschar. No debridement was done today. It has a pale-looking base to it. although this is not as deep as  it once was it seems to be expanding especially inferiorly. She has had recent venous ablation but this is no  closer to healing.I've gone ahead and done a punch biopsy this from the inferior part of the wound close to normal skin 09/22/15: the wound is relatively free of surface eschar. There is some surrounding eschar. I'm not exactly sure at what level the surface is that I am seeing. 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 No evidence of atypical infection, malignancy. 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 No debridement was done 09/29/15; the wound surface is relatively free of surface eschar. There is eschar surrounding the walls of the wound. Aside from the improvement in the amount of surface slough. This wound has not progressed any towards closure. There is not even a surface that looks like there at this is ready. There is no evidence of any infection nor maligancy based on biopsy I did on 9/15. I continued with the Iodoflex however I am looking towards some alternative to try to promote some closure or filling in of this surface. Consider triple layer [place]. Collagen did not result in adequate control of the surface slough 10/13/15; the patient was in hospital last week with severe anemia. The wound looks somewhat better after debridement although there is widening medially. There is no evidence of infection. 10/20/15; patient's wound on the right lateral lower leg is essentially unchanged. This underwent a light surface debridement and in general the debridement is easier and the surface looks improved. I noted in doing this on the side of the wound what appears to be a piece of calcium deposition. The patient noted that she had previous things on the tips of her fingers. 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 She follows with rheumatology and I have asked them to talk to her about this. In view of that S the nonhealing ulcer may have something to do with  calcinosis and also unrelieved Raynaud's disease in this area. I should note that her original wounds on the right and left medial malleolus and the inner aspects of both legs just below the knees did however heal over 10/30/15; the patient's wound on the right lateral lower leg is essentially unchanged. I was able to remove some calcified material from the medial wound edge. 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 Otherwise the wound appears essentially unchanged there is less adherent surface eschar. Some of the calcified material was sent to pathology for analysis 11/17/15. The patient's wound on the right lateral leg is essentially unchanged. Wider Medially. 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 There is some surface slough but I did not debridement this. No evidence of surrounding infection. 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 This will make healing difficult 12/15/15; the wound has less depth surface has a fibrinous slough and calcifications in the wound edge no evidence of infection 12/22/15; the wound definitely has less Fibrinous slough on the base. Calcifications around the wound edges are still evident. Although the wound bed looks healthier it is still pale in appearance. Previous biopsy did not show malignancy 01/04/15; surgical debridement  of nonviable slough and subcutaneous tissue the wound cleans up quite nicely but appears to be expanding outward calcifications around the wound edges are still there. 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 She is to see her rheumatologist I'll see if he has any  opinions on this. My punch biopsy done in September did not show calcifications although these are clearly evident. 01/19/16 light selective debridement of nonviable surface slough. There is epithelialization medially. This gives me reason for cautious optimism. She has been to see her rheumatologist, there is nothing that can be done for this type of soft tissue calcification associated with scleroderma 02/02/16 no debridement although there is a light surface slough. She has 2 peninsulas of skin 1 inferiorly and one medially. We continue to make a slight and slow but definite progressive here 02/16/16; light surface debridement with more attention to the circumference of the wound bed where the fibrinous eschar is more prevalent. No calcifications detected. She seems to have done nicely with the [facility] with some epithelialization and some improvement in the overall wound volume. 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 Continued attention to the circumference of the wound where the fibrinous eschar in calcinosis or prevalent. No calcifications were detected. She has continued improvement with Hydrofera Blue. The wound is no longer as deep 03/15/16 surgical debridement done to remove surface escha Especially around the circumference of the wound where there is nonviable subcutaneous tissue. In spite of this there is considerable improvement in the overall dimensions and depth of the wound. Islands of epithelialization are seen especially medially inferiorly and superiorly to a lesser extent. She is using Hydrofera Blue at home 03/29/16; surgical debridement done to remove surface eschar and nonviable subcutaneous tissue. This cleans up quite nicely mention slightly larger in terms of length and width however depth is less 04/12/16; continued gradual improvement in  terms of depth and the condition of the wound base. Debridement is done. Continuing long standing Hydrofera Blue at home with Kerlix Coban wraps 04/26/16; continued gradual improvement in terms of depth and management as well as condition of the wound base. Surgical debridement done she continues with Hydrofera Blue. 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 She initially came to this clinic venous insufficiency ulcers which have long since healed 05/17/16 continued improvement in terms of the depth and measurements of this wound although she has a tightly adherent fibrinous slough each time. We've been continued with long standing Hydrofera Blue which seems to done as well for this wound is many advanced treatment options. 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 She also has chronic venous insufficiency. She has an irritation on her lateral right ankle secondary to our wraps 05/10/16; wound appears to be smaller especially on the medial aspect and especially in the width. Wound was debridement surface looks better. She is also been in the hospital apparently with anemia again she tells me she had an endoscopy. Since she got home after 3 days which I believe was sometime last week she has had an irritated painful area on the right lateral ankle surrounding the lateral malleolus 05/31/16; much more adherent surface slough today then recently although I don't think the dimensions of changed that much. A more aggressive debridement is required. The irritated area over her medial malleolus  is more pruritic and painful and I don't think represents cellulitis 06/14/16; no major change in her wound dimensions however there is more tightly adherent surface slough which is disappointing. As well as she appears to have a new small area medially. Furthermore an irritated uncomfortable area on the lateral aspect of her right foot just below  the lateral malleolus. 06/21/16; I'm seeing the patient back in follow-up for the new areas under her major wound on the right anterior leg. She has been using Hydrofera Blue to this area probably for several months now and although the dressings seem to be helping for quite a period of time I think things have stagnated lately. She comes in today with a relatively tight adherent surface slough and really no changes in the wound shape or dimension. The 2 small areas she had inferiorly are tiny but still open they seem improved this well. There is no uncontrolled edema and I don't think there is any evidence of cellulitis. 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 Patient has had vascular evaluations both venous and arterial. I have biopsied this area. There is no obvious infection. The worrisome thing today is that she seems to be developing areas of erythema and epithelial damage on the medial aspect of the right foot. Also to a lesser degree inferior to the actual wound itself. Again I see no obvious changes to suggest cellulitis however as this is the only treatable option I will probably give her antibiotics. 07/13/16 no major change in the lady's large anterior right leg wound. Still covered with a very tightly adherent surface slough which is difficult to debridement. There is less erythema around this, culture last week grew pseudomonas I gave her ciprofloxacin. The area on her lateral right malleolus looks better- 08/02/16 the patient's wounds continued to decline. Her original large anterior right leg wound looks deeper. Still adherent surface slough that is difficult to debridement. She has a small area just below this and a punched-out wound just below her lateral malleolus. In the meantime she is been in hospital with apparently an upper GI bleed on Plavix and aspirin. She is now just on  Plavix she received 3 units of packed cells 08/23/16; since I last saw this 3 weeks ago, the open large area on her right leg looks about the same syrup. She has a small satellite lesion just underneath this. The area on her medial right ankle is now a deep necrotic wound. I attempted to debridement this however there is just too much pain. 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 She follows with vascular surgery and is scheduled for an angiogram in early September 09/06/16; the patient is going for an arteriogram tomorrow. Her original large wound on the right calf is about the same the satellite lesion underneath it is about the same however the area on her medial ankle is now deeper with exposed tendon. I am no longer attempting to debride these wounds 09/20/16; the patient has undergone a right femoral endarterectomy and Dacron patch angioplasty. This seems to have helped the flow in her right leg. 10/04/16; Arrived today for aggressive debridement of the wounds on her right calf the original wound the one beneath it and a difficult area over her right lateral leg just above the lateral malleolus. 10/25/16; her 3  open wounds are about the same in terms of dimensions however the surface appears a lot healthier post debridement. Using Iodoflex 10/18/16 we have been using Iodoflex to her wounds which she tolerated with some difficulty. 10/11/16; has been using Santyl for a period of time with some improvement although again very adherent surface slough would prevent any attempted healing this. She has a original wound on the left calf, the satellite underneath that and the most recent wound on the right medial ankle. She has completed revascularization by Dr. early and has had venous ablation earlier. Want to go back to Iodoflex to see if week and get a healthier surface to this wound bed failing this I think she'll need to be  taken to the OR and I am prepared to call Dr. Marla Roe to discuss this. She is obviously not a good candidate for general anesthesia however.; 11/08/16; I put her on Iodoflex last time to see if I can get the wound bed any healthier and unfortunately today still had tremendous surface slough. 11/15/16; 4 weeks' worth of Iodoflex with not much improvement. Debridement on the major wounds on her left anterior leg is easier however this does not maintain from week to week. The punched-out area on her medial right ankle 11/29/16; I attempted to change the patient last visit to [facility] however she states this burned and was very uncomfortable therefore we gave her permission to go back to the eye out of complex which she already had at home. Also she noted a lot of pain and swelling on the lateral aspect of her leg before she traveled to [facility] for the holidays, I called her in doxycycline over the phone. This seems to have helped 12/06/16; Wounds unchanged by in large. Using Iodoflex 12/13/16; her wounds today actually looks somewhat better. The area on the right lateral lower leg has reasonably healthy-looking granulation and perhaps as actually filled and a bit. Debridement of the 2 wounds on the medial calf is easier and post debridement appears to have a healthier base. We have referred her to Dr. Migdalia Dk for consideration of operative debridement 12/20/16; we have a quick note from Dr. Merri Ray who feels that the patient needs to be referred to an academic center/plastic surgeon. This is due to the complexity of the patient's medical issues as it applies to anything in the OR. We have been using Iodoflex 12/27/16; in general the wounds on the right leg are better in terms of the difficult to remove surface slough. She has been using Iodoflex. She is approved for Apligraf which I anticipated ordering in the next week or 2 when we get a better-looking surface 01/04/16  the deep wounds on the right leg generally look better. Both of them are debrided further surface slough. The area on the lateral right leg was not debrided. She is approved for Apligraf I think I'll probably order this either next week or the week after depending on the surface of the wounds superiorly. We have been using Iodoflex which will continue until then 01/11/16; the deep wounds on the right leg again have a surface slough that requires debridement. I've not been able to get the wound bed on either one of these wounds down to what would be acceptable for an advanced skin stab-like Apligraf. The area on the medial leg has been improving. We have been using hot Iodoflex to all wounds which seems to do the best at at least limiting the nonviable surface 01/24/17; we  have continued Iodoflex and all her wound areas. Her debridement Gen. he is easier and the surface underneath this looks viable. Nevertheless these are large area wounds with exposed muscle at least on the anterior parts. We have ordered Apligraf's for 2 weeks from now. The patient will be away next week 02/07/17; the patient was close to have first Apligraf today however we did not order one. I therefore replaced her Iodoflex. She essentially has 3 large punched- out areas on her right anterior leg and right medial ankle. 02/11/17; Apligraf #1 02/25/17; Apligraf #2. In general some improvement in the right medial ankle and right anterior leg wounds. The larger wound medially perhaps some better 03/11/17; Apligraf #3. In general continued improvement in the right medial ankle and the right anterior leg wounds. 03/25/17 Apligraf #4. In general continued improvement especially on the right medial ankle and the lower 04/08/17; Apligraf #5 in general continued improvement in all wound sites. 04/22/17; post Apligraf #5 her wound beds continued to look a lot better all of them up to the surface of the surrounding skin. Had a caramel-colored slough  that I did not debrided in case there is residual Apligraf effect. The wounds are as good as I have seen these looking quite some time. 04/29/17; we applied [place] and last week after completing her Apligraf. Wounds look as though they've contracted somewhat although they have a nonviable surface which was problematic in the past. Apparently she has been approved for further Apligraf's. We applied Iodosorb today after debridement. 05/06/17; we're fortunate enough today to be able to apply additional Apligraf approved by her insurance. In general all of her wounds look better Apligraf #6 05/24/17; Apligraf #7 continued improvement in all wounds 3 06/10/17 Apligraf #8. Continued improvement in the surface of all wounds. Not much of an improvement in dimensions as I might a follow 06/24/17 Apligraf #9 continued general improvement although not as much change in the wound areas I might of like. She has a new open area on the right anterior lateral ankle very small and superficial. She also has a necrotic wound on the tip of her right index finger probably secondary to severe uncontrolled Raynaud's phenomenon. She is already on sildenafil and already seen her rheumatologist who gave her Keflex. 07/08/17; Apligraf #10. Generalized improvement although she has a small additional wound just medial to the major wound area. 07/22/17; after discussion we decided not to reorder any further Apligraf's although there is been considerable improvement with these it hasn't been recently. The major wound anteriorly looks better. Smaller wounds beneath this and the more recent one and laterally look about the same. The area on the right lateral lower leg looks about the same 07/29/17 this is a patient who is exceedingly complex. She has advanced scleroderma, crest syndrome including calcinosis, PAD status post revascularization, chronic venous insufficiency status post ablations. She initially presented to this clinic  with wounds on her bilateral lower legs however these closed. More recently we have been dealing with a large open area superiorly on the right anterior leg, a smaller wound underneath this and then another one on the right medial lower extremity. These improve significantly with 10 Apligraf applications. Over the last 2-3 weeks we are making good progress with Hydrofera Blue and these seem to be making progress towards closure 08/19/17; wounds continued to make good improvement with Hydrofera Blue and episodic aggressive debridements 08/26/17; still using Hydrofera Blue. Good improvements 09/09/17; using Hydrofera Blue continued improvement. Area on the  lateral part of her right leg has only a small remaining open area. The small inferior satellite region is for all intents and purposes closed. Her major wound also is come in in terms of depth and has advancing epithelialization. 09/16/17; using Hydrofera Blue with continued improvement. The smaller satellite wound. We've closed out today along with a new were satellite wound medially. The area on her medial ankle is still open and her major wound is still open but making improvement. All using Hydrofera Blue. Currently 09/30/17; using Hydrofera Blue. Still a small open area on the lateral right ankle area and her original major wound seems to be making gradual and steady improvement. 10/14/17; still using Hydrofera Blue. Still too small open areas on the right lateral ankle. Her original major wound is horizontal and linear. The most problematic area paradoxically seems to be the area to the medial area wears I thought it would be the lateral. 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 10/28/17; still using Hydrofera Blue. Right lateral ankle has a very small open area superiorly on the most lateral part of the wound. Her original open wound has 2 open areas now separated by normal skin and we've  redefined this. 11/11/17; still using Hydrofera Blue area and right lateral ankle continues to have a small open area on mostly the lateral part of the wound. Her original wound has 2 small open areas now separated by a considerable amount of normal skin 11/28/17; the patient called in slightly before Thanksgiving to report pain and erythema above the wound on the right leg. In the past this is responded well to treatment for cellulitis and I gave her over the phone doxycycline. She stated this resulted in fairly abrupt improvement. We have been using Hydrofera Blue for a prolonged period of time to the larger wound anteriorly into the remaining wound on her right right lateral ankle. The latter is just about closed with only a small linear area and the bottom of the [place]. 12/02/17; use endoform and left the dressing on since last visit. There is no tenderness and no evidence of infection. 12/16/17; patient has been using Endoform but not making much progress. The 2 punched out open areas anteriorly which were the reminiscence of her major wound appear deeper. The area on the lateral aspect of her right calf also appears deeper. Also she has a puzzling tender swelling above her wound on the right leg. This seems larger than last time. Just above her wounds there appears to be some fluctuance in this area it is not erythematous and there is no crepitus 12/30/17; patient has been using Endoform up until last week we used Hydrofera Blue. Ultrasound of the swelling above her 2 major wounds last time was negative for a fluid collection. I gave her cefaclor for the erythema and tenderness in this area which seems better. Unfortunately both punched out areas anteriorly and the area on her right medial lower leg appear deeper. In fact the lateral of the wounds anteriorly actually looks as though it has exposed tendon and/or muscle sheath. She is not systemically unwell. She is complaining of vaginitis type  symptoms presumably Candida from her antibiotics. 01/06/18; we're using santyl. she has 2 punched out areas anteriorly which were initially part of a large wound. Unfortunately medially this is now open to tendon/muscle. All the wounds have the same adherent very difficult to debride surface. 01/20/18; 2 week follow-up using Santyl. She has  the 2 punched out areas anteriorly which were initially part of her large surface wound there. Medially this still has exposed muscle. All of these have the same tightly adherent necrotic surface which requires debridement. PuraPly was not accepted by the patient's insurance however her insurance I think it changed therefore we are going to run Apligraf to gain 02/03/18; the patient has been using Santyl. The wound on the right lateral ankle looks improved and the 2 areas anteriorly on the right leg looks about the same. The medial one has exposed muscle. The lateral 1 requiresdebridement. We use PuraPly today for the first week 02/10/18; PuraPly #2. The patient has 3 wounds. The area on the right lateral ankle, 2 areas anteriorly that were part of her original large wound in this area the medial one has exposed muscle. All of the wounds were lightly debrided with a number 3 curet. PuraPly #2 applied the lateral wound on the calf and the right lateral ankle look better. 02/17/18; PuraPly #3. Patient has 3 wounds. The area on the right lateral ankle in 2 areas internally that were part of her original large wound. The lateral area has exposed muscle. She arrived with some complaints of pain around the right ankle. 02/24/18; PuraPly #4; not much change in any of the 3 wound areas. Right lateral ankle, right lateral calf. Both of these required debridement with a #3 curet. She tolerates this marginally. The area on the medial leg still has exposed muscle. Not much change in dimensions 03/03/18 PuraPly #5. The area on the medial ankle actually looks better however the 2 separate  areas that were original parts of the larger right anterior leg wound look as though they're attempting to coalesce. 03/10/18; PuraPly #6. The area on the medial ankle actually continues to look and measures smaller however the 2 separate areas that were part of the original large wound on the right anterior leg have now coalesced. There hasn't been much improvement here. The lateral area actually has underlying exposed muscle 03/17/18-she is here in follow-up evaluation for ulcerations to the right lower extremity. She is voicing no complaints or concerns. She tolerated debridement. Puraply#7 placed 03/24/18; difficult right lower extremity ulcerations. PuraPly #8 place. She is been approved for [employer]. She did very well with Apligraf today however she is apparently reached her "lifetime max" 03/31/18; marginal improvement with PuraPly although her wounds looked as good as they have in several weeks today. Used TheraSkins #1 04/14/18 TheraSkin #2 today 04/28/18 TheraSkins #3. Wound slightly improved 05/12/18; TheraSkin skin #4. Wound response has been variable. 05/27/18 TheraSkin #5. Generally improvement in all wound areas. I've also put her in 3 layer compression to help with the severe venous hypertension 06/09/18; patient is done quite well with the TheraSkins unfortunately we have no further applications. I also put her in 3 layer compression last week and that really seems to of helped. 06/16/18; we have been using silver collagen. Wounds are smaller. Still be open area to the muscle layer of her calf however even that is contracted somewhat. She tells me that at night sometimes she has pain on the right lateral calf at the site of her lower wound. Notable that I put her into 3 layer compression about 3 weeks ago. She states that she dangles her leg over the bed that makes it feel better but she does not describe claudication during the day She is going to call her secondary insurance to see if  they will continue to cover  advanced treatment products I have reviewed her arterial studies from 01/22/17; this showed an ABI in the right of 1 and on the left noncompressible. TBI on the right at 0.30 on the left at 0.34. It is therefore possible she has significant PAD with medial calcification falsely elevating her ABI into the normal range. I'll need to be careful about asking her about this next week it's possible the 3 layer compression is too much 06/23/18; was able to reapply TheraSkin 1 today. Edema control is good and she is not complaining of pain no claudication 07/07/18;no major change. New wound which was apparently a taper removal injury today in our clinic between her 2 wounds on the right calf TheraSkins #2 07/14/18; I think there is some improvement in the right lateral ankle and the medial part of her wound. There is still exposed muscle medially. 07/28/18; two-week follow-up. TheraSkins#3. Unfortunately no major change. She is not a candidate I don't think for skin grafting due to severe venous hypertension associated with her scleroderma and pulmonary hypertension 08/11/18 Patient is here today for her Theraskin application #10 (#5 of the second set). She seems to be doing well and in the base of the wound appears to show some progress at this point. This is the last approved Theraskin of the second set. 08/25/18; she has completed TheraSkin. There has been some improvement on the right lateral calf wound as well as the anterior leg wounds. The open area to muscle medially on the anterior leg wound is smaller. I'm going to transition her back to [facility] under Kerlix Coban change every second day. She reports that she had some calcification removed from her right upper arm. We have had previous problems with calcifications in her wounds on her legs but that has not happened recently 09/08/18;using Hydrofera Blue on both her wound areas. Wounds seem to of contracted nicely. She uses  Kerlix Coban wrap and changes at home herself 09/22/2018; using Hydrofera Blue on both her wound areas. Dimensions seem to have come down somewhat. There is certainly less depth in the medial part of the mid tibia wound and I do not think there is any exposed muscle at this point. 10/06/2018; 2-week follow-up. Using [facility] on her wound areas. Dimensions have come down nicely both on the right lateral ankle area in the right mid tibial area. She has no new complaints 10/20/2018; 2-week follow-up. She is using Hydrofera Blue. Not much change from the last time she was here. The area on the lateral ankle has less depth although it has raised edges on one side. I attempted to remove as much of the raised edge as I could without creating more additional wounds. The area on the right anterior mid tibia area looks the same. 11/03/2018; 2-week follow-up she is using Hydrofera Blue. On the right anterior leg she now has 2 wounds separated by a large area of normal skin. The area on the medial part still has I think exposed muscle although this area itself is a lot smaller. The area laterally has some depth. Both areas with necrotic debris. The area on her right lateral ankle has come in nicely 11/17/2018; patient continues to use Hydrofera Blue. We have been increasing separation of the 2 wounds anteriorly which were at one point joint the area on the right lateral calf continues to have I think some improvement in depth. 12/08/2018; patient continues to use Hydrofera Blue. There is some improvement in the area on the right lateral  calf. The 2 areas that were initially part of the original large wound in the mid right tibia are probably about the same. In fact the medial area is probably somewhat larger. We will run puraply through the patient's insurance 12/22/2018; she has been using Hydrofera Blue. We have small wounds on the right lateral calf and 2 small areas that were initially part of a large  wound in the right mid tibia. We applied pure apply #1 today. 12/29/2018; we applied puraply #2. Her wounds look somewhat better especially on the right lateral calf and the lateral part of her original wound in the mid tibial area. 01/05/2019; perhaps slightly improved in terms of wound bed condition but certainly not as much improvement as I might of liked. Puraply #3 1/13: we did not have a correct sized puraply to apply. wounds more pinched out looking, I increased her compression to 3 layer last week to help with significant multilevel venous hypertension. Since then I've reviewed her arterial status. She has a right femoral endarterectomy and a distal left SFA stent. She was being followed by Dr. [name] for a period however she does not appear to have seen him in 3 years. I will set up an appointment. 1/20. The patient has an additional wound on the right lateral calf between the distal wound and proximal wounds. We did not have Puraply last week. Still does not have a follow-up with Dr. early 1/27: Follow-up with Dr. early has been arranged apparently with follow-up noninvasive studies. Wounds are measuring roughly the same although they certainly look smaller 2/3; the patient had non invasive studies. Her ABIs on the right were 0.83 and on the left 1.02 however there was no great toe pressure bilaterally. Also worrisome monophasic waveforms at the PTA and dorsalis pedis. We are still using Puraply. We have had some improvement in all of the wounds especially the lateral part of the mid tibial area. 2/10; sees Dr. early of vein and vascular re-arterial studies next week. Puraply reapplied today. 2/17; Dr. early of vein and vascular his appointment is tomorrow. Puraply reapplied after debridement of all wounds 2/24; the patient saw Dr. early I reviewed his note. sHe noted the previous right femoral endarterectomy with a Dacron patch. He also noted the normal ABI and the monophasic waveforms  suggesting tibial disease. Overall he did not feel that she had any evidence of arterial insufficiency that would impair her wound healing. He did note her venous disease as well. He suggested PRN follow-up. 3/2; I had the last puraply applied today. The original wounds over the mid tibia area are improved where is the area on the right lower leg is not 3/9; wounds are smaller especially in the right mid tibia perhaps slightly in the right lateral calf. We finished with puraply and went to endoform today 3/23; the patient arrives after 2 weeks. She has been using endoform. I think all of her wounds look slightly better which includes the area on the right lateral calf just above the right lateral malleolus and the 2 in the right mid tibia which were initially part of the same wound. 4/27; TELEHEALTH visit; the patient was seen for telehealth visit today with her consent in the middle of the worldwide epidemic. Since she was last here she called in for antibiotics with pain and tenderness around the area on the right medial ankle. I gave her empiric doxycycline. She states this feels better. She is using endoform on both of these areas 5/11  TELEHEALTH; the patient was seen for telehealth visit today. She was accompanied at home by her husband. She has severe pulmonary hypertension accompanied scleroderma and in the face of the Covid epidemic cannot be safely brought into our clinic. We have been using endoform on her wound areas. There are essentially 3 wound areas now in the left mid tibia now 2 open areas that it one point were connected and one on the right lateral ankle just above the malleolus. 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 She cannot come to clinic. The wounds were reviewed today via telehealth. She has severe  chronic venous hypertension which I think is centrally mediated. She has wounds on her right anterior tibia and right lateral ankle area. These are chronic. She has been using endoform. 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 We have been following her from telehealth. She has severe chronic venous hypertension which may be mostly centrally mediated secondary to right heart heart failure. She has wounds on her right anterior tibia and right lateral ankle these are chronic we have been using endoform 6/22; TELEHEALTH; this patient was seen today via telehealth. 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 Since the last time we had contact with this patient she developed some pain and erythema around the wound on her right lateral malleolus/ankle and we put in antibiotics for her. This is resulted in good improvement with resolution of the erythema and tenderness. I changed her to silver alginate last time. We had been using endoform for an extended period of time 7/13; TELEHEALTH; this patient was seen today via telehealth. 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 She would continue to be at a prohibitive risk to be brought into our clinic unless this was absolutely necessary. These visits have been done with her approval as well as her husband. We have been using silver alginate to the areas on the right mid tibia and right lateral lower leg. 7/27 TELEHEALTH; patient was seen along with her husband today via telehealth. 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 We changed her to sample last visit. She has 2 areas a chronic wound on her right mid tibia  and one just above her ankle. These were not the original wounds when she came into this clinic but she developed them during treatment 8/17; she comes in for her first face-to-face visit in a long period. She has a remaining area just medial to the right tibia which is the last open part of her large wound across this area. She also has an area on the right lateral lower leg. We prescribed Santyl last telehealth visit but they were concerned that this was making a deeper so they put silver alginate on it last week. Her husband changes the dressings. 8/31; using Santyl to the 2 wound areas some improvement in wound surfaces. Husband has surgery in 2 weeks we will put her out 3 weeks. Any of the advanced treatment options that I can think of that would be eligible for  this wound would also cause her to have to come in weekly. The risk that the patient is just too high 9/21. Using Santyl to the 2 wound areas. Both of these are somewhat better although the medial mid tibia area still has exposed muscle. Lateral ankle requiring debridement. Using Santyl 10/12; using Santyl to the 2 wound areas. One on the right lateral ankle and the other in the medial calf which still has exposed muscle. Both areas have come down in size and have better looking surfaces. She has made nice progress with santyl 11/2; we have been using Santyl to the 2 wound areas. Right lateral ankle and the other in the mid tibia area the medial part of this still has exposed muscle. 11/23/2019 on evaluation today patient appears to be doing about the same really with regard to her wounds. She is actually not very pleased with how things seem to be progressing at this point she tells me that she really has not noted much improvement unfortunately. With that being said there is no signs of active infection at this time. There is some slough buildup noted at this time which again along with some dry skin around the edges of the wound I  think would benefit her to try to debride some of this away. Fortunately her pain is doing fairly well. She still has exposed muscle in the right medial/tibial area. 12/14; TELEHEALTH; she was changed to [facility] to the right calf wound and right lateral ankle wound when she was here last time. Unfortunately since then she had a fall with a pelvic fracture and a fracture of her wrist. She was apparently hospitalized for 5 days. I have not looked at her discharge summary. She apparently came out of the hospital with a blister on her right heel. She was seen today via telehealth by myself and our case manager. The patient and her husband were present. She has been using Hydrofera Blue at our direction from the last time she was in the clinic. There is been no major improvement in fact the areas appear deeper and with a less viable surface 01/04/2020; TELEHEALTH; the patient was seen today in accompaniment of her husband and our nurse. She has 3 open areas 1 on the right medial mid tibia, one on the right lateral ankle and a large eschared pressure area on the right heel. We have been using Iodoflex to the 2 original wounds. 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 It is simply too perilous for her to be seen in any other way 1/26; TELEHEALTH; the patient was seen today in accompaniment of her husband and our RN one of our nurses. She still has the 3 open areas 1 on the right medial mid tibia which is the remanent of a more extensive wound in this area, one on the right lateral ankle and a large eschared pressure area on the right heel. We have been using Iodoflex to the 2 original wounds and a bed at 9 application to the eschared area on the heel 2/15; the first time we have seen this patient and then several months out of concern for the pandemic. She had a large horizontal wound in the mid tibia. Only the medial aspect of this is still open. Area on the lateral  ankle is just about closed. She had a new pressure ulcer on the right heel which I have removed some of the eschar. We have been using Iodoflex which I will  continue. The area in the mid tibia has a round circle in the middle of exposed muscle. I think we would have to use an advanced treatment product to stimulate granulation over this area. We will run this through her insurance. She is not eligible for plastic surgery for many reasons 3/1; TELEHEALTH. The patient was seen today by telehealth. 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 We have been using Iodoflex to the wound areas which include the right anterior mid tibia, right lateral ankle and the right heel. All of these were reviewed 3/8; the patient's wound just above the right ankle is closed. She still has a contracting black eschar on the heel where she had a pressure ulcer. The medial part of her original wound on the mid tibia has exposed muscle. We have made really made no progress in this area although we have managed to get a lot of the original wound in this area to close. We used Apligraf #1 today 3/22; the patient's ankle wound remains closed. She still has a contracting black eschar on the heel although it seems to have less surface area. It still not clear whether there is any depth here. We used Apligraf #2 today in an attempt to get granulation over the exposed muscle and what is left of her mid tibia area. 4/5; everything is closed except the medial aspect of the mid tibia wound, as well as the pressure injury on the right heel. She has been using Betadine to the right heel which has been gradually contracting. We used Apligraf #3 today. 4/19; Apligraf #4. Still a pressure area on the right heel she has been using Betadine superficial excoriated areas she has been applying salicylic acid to on the anterior leg and the thick horn  on the leg just above her wound area 5/3; Apligraf #5. Unfortunately she came in today with a reopening of the area on the right lateral lower leg. Not much change in the wound we have been treating in the mid calf. Quite a bit of edema surrounding this wound. She reports that she was up on her feet quite a bit. The area on the right heel is separating she has been using Betadine She comes in with a new wound on the left lateral malleolus which is very disappointing this is been open since last week 5/17; we applied her last Apligraf last time. Unfortunately that did not have any effect on the deep area medially in the mid tibial area. However the area over the right lateral malleolus is a lot better. Right heel also is contracted we used Iodoflex last time. The new wound on the left lateral malleolus were using Santyl to. This required debridement. 6/1. Not much change in the area on the right medial mid tibia. Right lateral ankle is improved she has the necrotic area on the right heel which was a pressure area. New area on the left lateral malleolus from last time. I changed her to Iodoflex to the area on the left lateral malleolus she said this hurt I have put her back on Santyl 6/15; right mid tibia is unchanged. I do not think there is anything I can do to this topically that we will get this to granulate over the muscle. And I am furthermore I am not sure that she is a surgical candidate either because of her severe pulmonary issues or because she really does not  want to go through it. The area on the right lateral malleolus is a small punched out area. The area on the left lateral malleolus again small punched out with nonviable surface Pressure ulcer on the right heel at separating black eschar. I went ahead and remove this today she has an area on the left anterior mid tibia just laterally. Geographic wounds debris on the surface. Some erythema here. I think this is developing because of  chronic venous/lymphedema. There is skin changes widely Change the primary dressing to Sorbact to all wound areas. She needs compression on the left leg as well as the right 6/21; right mid tibia which was her one remaining wound at 1 point is unchanged The area on the right lateral malleolus actually is close to closing over still a small punched out area The area on the left lateral malleolus again a deep wound it is this 1 that she feels pain in. Pressure ulcer on the right heel was cultured today. New area on the left anterior mid tibia several geographic wounds last week in the setting of chronic venous insufficiency this actually looks some better 7/6; Right mid tibia about the same. Exposed muscle Left lateral malleolus again necrotic debris over the surface. Pressure ulcer on the right heel. She finished the Keflex we prescribed. Necrotic debris debrided with a #3 curette The rest of her wounds on the right mid tibia right lateral ankle are better Her husband reminds me that she had a right femoral endarterectomy and has 2 stents in her left thigh placed in 2011 approximately by vascular surgery in [place]. She saw Dr. [name] last in February 2020. At that point he did not think that the arterial insufficiency she had on the right was sufficient to explain any of her right lower extremity wounds however she now has an area on the left lateral malleolus that looks like an ischemic wound Electronic Signature(s) Signed: 07/05/2020 5:09:59 PM By: Linton Ham MD Entered By: Linton Ham on 07/05/2020 16:56:31 -------------------------------------------------------------------------------- Physical Exam Details Patient Name: Date of Service: [name] [name] 07/05/2020 3:30 PM Medical Record Number: [id number] Patient Account Number: [id number] Date of Birth/Sex: Treating RN: [birth year] (69 y.o. [name] [name] Primary Care Provider: [name], [place] RY Other Clinician: Referring  Provider: Treating Provider/Extender: [name], MA RY Weeks in Treatment: 093 Constitutional Patient is hypertensive.. Pulse regular and within target range for patient.Marland Kitchen Respirations regular, non-labored and within target range.. Temperature is normal and within the target range for the patient.Marland Kitchen Appears in no distress. Notes Wound exam Midportion of the right tibia about the same still exposed muscle no debridement. Right lateral ankle is closed Left anterior ankle was not debrided today. I have not been able to get to a viable surface Right heel which was initially a pressure ulcer debrided of necrotic fat subcutaneous tissue Electronic Signature(s) Signed: 07/05/2020 5:09:59 PM By: Linton Ham MD Signed: 07/05/2020 5:09:59 PM By: Linton Ham MD Entered By: Linton Ham on 07/05/2020 16:57:23 -------------------------------------------------------------------------------- Physician Orders Details Patient Name: Date of Service: [name] [name], [name] 07/05/2020 3:30 PM Medical Record Number: [id number] Patient Account Number: [id number] Date of Birth/Sex: Treating RN: 1951-12-10 (69 y.o. 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 Dressing Change Frequency Do not change entire dressing for one week. Skin Barriers/Peri-Wound Care Moisturizing lotion - both legs Wound Cleansing May shower with protection. Primary Wound Dressing Wound #16 Right Calcaneus Cutimed Sorbact Wound #19 Right,Lateral Malleolus Cutimed Sorbact Wound #20 Left,Lateral Malleolus Cutimed Sorbact Wound #21 Left,Lateral Lower Leg Cutimed Sorbact Wound #5 Right,Medial Lower Leg Cutimed Sorbact Secondary Dressing Wound #16 Right Calcaneus Dry Gauze Heel Cup Wound #19 Right,Lateral Malleolus Dry Gauze Wound #20 Left,Lateral Malleolus Dry Gauze Wound #21 Left,Lateral Lower Leg Dry Gauze Wound #5 Right,Medial Lower Leg Dry Gauze Edema Control 3 Layer Compression System - Bilateral Avoid standing for long periods of time Elevate legs to the level of the heart or above for 30 minutes daily and/or when sitting, a frequency of: - throughout the day Off-Loading Turn and reposition every 2 hours Other: - float heels off of bed/chair with pillow under calves Consults Vascular Surgeon - Dr. [name] - non healing ulcers on bilateral lower legs/feet - (ICD10 I87.331 - Chronic venous hypertension (idiopathic) with ulcer and inflammation of right lower extremity) Electronic Signature(s) Signed: 07/05/2020 5:09:59 PM By: Linton Ham MD Signed: 07/05/2020 5:26:11 PM By: Levan Hurst RN, BSN Entered By: Levan Hurst on 07/05/2020 16:39:44 -------------------------------------------------------------------------------- Problem List Details Patient Name: Date of Service: [name] [name], [name] 07/05/2020 3:30 PM Medical Record Number: [id number] Patient Account Number: [id number] Date of Birth/Sex: Treating RN: [birth year] (69 y.o. 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 02/24/2015 No Yes I87.331 Chronic venous hypertension (idiopathic) with ulcer and inflammation of right 10/04/2016 No Yes lower extremity L89.616 Pressure-induced deep tissue damage of right heel 12/14/2019 No Yes L97.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 05/31/2020 No Yes L97.828 Non-pressure chronic ulcer of other part of left lower leg with other specified 06/14/2020 No Yes severity Inactive Problems ICD-10 Code Description Active Date Inactive Date 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 07/05/2020 3:30 PM Medical Record Number: [id number] Patient Account Number: [id number] Date of Birth/Sex: Treating RN: 05-20-1951 (69 y.o. [name] [name] Primary Care Provider: [name], [place] RY Other Clinician: Referring Provider: Treating Provider/Extender: [name], MA RY Weeks in Treatment: 432 Subjective History of Present Illness (HPI) this is  a patient who initially came to [place] for wounds on the medial malleoli bilaterally as well as her upper  medial lower extremities bilaterally. These wounds eventually healed with assistance of Apligraf's bilaterally. While this was occurring she developed the current wound which opened into a fairly substantial wound on the right lower extremity. These are mostly secondary to venous stasis physiology however the patient also has underlying scleroderma, pulmonary hypertension. The wound has been making good progress lately with the Hydrofera Blue-based dressing. 03/17/2015; patient had a arterial evaluation a year ago. Her right ABI was 0.86 left was 1.0. T brachial index was 0.41 on the right 0.45 on the left. Her oe bilateral common femoral artery waveforms were triphasic. Her white popliteal posterior tibial artery and anterior tibial artery waveforms were monophasic with good amplitude. Luteal artery waveforms were biphasic it was felt that her bilateral great toe pressures are of normal although adequate for tissue healing. 03/24/2015. The condition of this wound is not really improved that. He was covered with as fibrinous surface slough and eschar. This underwent an aggressive debridement with both a curette and scalpel. I still cannot really get down to what I can would consider to be a viable surface. There is no evidence of infection. Previous workup for ischemia roughly a year ago was negative nevertheless I think that continues to be a concern 04/07/15. The patient arrived for application of second Apligraf. Once again the surface of this wound is certainly less viable than I would like for an advanced treatment option. An aggressive debridement was done. She developed some arteriolar bleeding which required that along pressure and silver alginate. 04/21/15: change in the condition of this wound. Once again it is covered in a gelatinous surface slough. After debridement today surface of the wound looks somewhat better but now a heeling surface 05/05/15 Apligraf #4 applied.Still a lot of slough on  this wound. 05/19/15 Apligraf #5 applied. Still a lot of slough on this wound I did not aggressively remove this 06/02/15; continued copious amounts of surface slough. This debridement fairly easily. 06/08/2015 -- the last time she had a venous duplex study done was over 3 years ago and the surgery was prior to that. I have recommended that she sees Dr. early for a another opinion regarding a repeat venous duplex and possibly more endovenous ablation of vein stripping of micro-phlebotomies. 06/16/15; wound has a gelatinous surface eschar that the debridement fairly easily to a point. I don't disagree with the venous workup and perhaps even arterial re-evaluation. She is on prednisone 5 mg and continue his medications for her pulmonary artery hypertension I am not sure if the latter have any wound care healing issues I would need to investigate this. 06/23/15 continues with a gelatinous surface eschar with of fibrinous underlying. What I can see of this wound does not look unhealthy however I just can't get this material which I think is 2 different layers off. Empiric culture done 06/30/15; unfortunately not a lot of change in this wound. A gelatinous surface eschar is easily removed however it has a tight fibrinous surface underneath the. 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 The wound comes back and basically and unchanged state the. She has a gelatinous surface that is more easily removed however there is a tightly fibrinous surface underneath the. There is no evidence of infection. She has a vascular follow-up next month. I would have to inject her in order to do a more aggressive  debridement of this area 07/21/15 the wound is roughly in the same state albeit the debridement is done with greater ease. There is less of the fibrinous eschar underneath the. There is no evidence of infection. She has follow-up with vascular surgery next week. No evidence of surrounding infection. Her  original distal wounds healed while this one formed. 07/28/15; wound is easier to debride. No wound erythema. She is seeing Dr. [name] next week. 08/18/15 Has been to [place] for repeat ablation. Have been using medihoney pad with some improvement 08/25/15; absolutely no change in the condition of this wound in either its overall size or surface condition in many months now. At one point I had this down to [place] healthier surface I think with [facility] however this did not actually progress towards closure. Do not believe that the wound has actually deteriorated in terms of volume at all. We have been using a medihoney pad which allows easier debridement of the gelatinous eschar but again no overall actual improvement. the patient is going towards an ablation with pain and vascular which I think is scheduled for next week. The only other investigation that I could first see would be a biopsy. She does have underlying scleroderma 09/02/15 eschar is much easier to debridement however the base of this does not look particularly vibrant. We changed to Iodoflex. The patient had her ablation earlier this week 09/09/15 again the debridement over the base of this wound is easier and the base of the wound looks considerably better. We will continue the Iodoflex. Dr. Tawni Millers has expressed his satisfaction with the result of her ablation 09/15/15 once again the wound is relatively free of surface eschar. No debridement was done today. It has a pale-looking base to it. although this is not as deep as it once was it seems to be expanding especially inferiorly. She has had recent venous ablation but this is no closer to healing.I've gone ahead and done a punch biopsy this from the inferior part of the wound close to normal skin 09/22/15: the wound is relatively free of surface eschar. There is some surrounding eschar. I'm not exactly sure at what level the surface is that I am seeing. 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 No evidence of atypical infection, malignancy. 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 No debridement was done 09/29/15; the wound surface is relatively free of surface eschar. There is eschar surrounding the walls of the wound. Aside from the improvement in the amount of surface slough. This wound has not progressed any towards closure. There is not even a surface that looks like there at this is ready. There is no evidence of any infection nor maligancy based on biopsy I did on 9/15. I continued with the Iodoflex however I am looking towards some alternative to try to promote some closure or filling in of this surface. Consider triple layer Oasis. Collagen did not result in adequate control of the surface slough 10/13/15; the patient was in hospital last week with severe anemia. The wound looks somewhat better after debridement although there is widening medially. There is no evidence of infection. 10/20/15; patient's wound on the right lateral lower leg is essentially unchanged. This underwent a light surface debridement and in general the debridement is easier and the surface looks improved. I noted in doing this on the side of the wound what appears to be a piece of calcium deposition. The patient noted that she had previous  things on the tips of her fingers. 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 She follows with rheumatology and I have asked them to talk to her about this. In view of that S the nonhealing ulcer may have something to do with calcinosis and also unrelieved Raynaud's disease in this area. I should note that her original wounds on the right and left medial malleolus and the inner aspects of both legs just below the knees did however heal over 10/30/15; the patient's wound on the right lateral lower leg is essentially unchanged. I was able  to remove some calcified material from the medial wound edge. 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 Otherwise the wound appears essentially unchanged there is less adherent surface eschar. Some of the calcified material was sent to pathology for analysis 11/17/15. The patient's wound on the right lateral leg is essentially unchanged. Wider Medially. 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 There is some surface slough but I did not debridement this. No evidence of surrounding infection. 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 This will make healing difficult 12/15/15; the wound has less depth surface has a fibrinous slough and calcifications in the wound edge no evidence of infection 12/22/15; the wound definitely has less Fibrinous slough on the base. Calcifications around the wound edges are still evident. Although the wound bed looks healthier it is still pale in appearance. Previous biopsy did not show malignancy 01/04/15; surgical debridement of nonviable slough and subcutaneous tissue the wound cleans up quite nicely but appears to be expanding outward calcifications around the wound edges are still there. 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 She is to see her rheumatologist I'll see if he has any opinions on this. My punch biopsy done in September did not show calcifications although these are clearly evident. 01/19/16 light selective debridement of nonviable surface slough. There is epithelialization medially. This gives me reason for cautious optimism. She has been to see her rheumatologist, there is nothing  that can be done for this type of soft tissue calcification associated with scleroderma 02/02/16 no debridement although there is a light surface slough. She has 2 peninsulas of skin 1 inferiorly and one medially. We continue to make a slight and slow but definite progressive here 02/16/16; light surface debridement with more attention to the circumference of the wound bed where the fibrinous eschar is more prevalent. No calcifications detected. She seems to have done nicely with the [facility] with some epithelialization and some improvement in the overall wound volume. 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 Continued attention to the circumference of the wound where the fibrinous eschar in calcinosis or prevalent. No calcifications were detected. She has continued improvement with Hydrofera Blue. The wound is no longer as deep 03/15/16 surgical debridement done to remove surface escha Especially around the circumference of the wound where there is nonviable subcutaneous tissue. In spite of this there is considerable improvement in the overall dimensions and depth of the wound. [place] of epithelialization  are seen especially medially inferiorly and superiorly to a lesser extent. She is using Hydrofera Blue at home 03/29/16; surgical debridement done to remove surface eschar and nonviable subcutaneous tissue. This cleans up quite nicely mention slightly larger in terms of length and width however depth is less 04/12/16; continued gradual improvement in terms of depth and the condition of the wound base. Debridement is done. Continuing long standing Hydrofera Blue at home with Kerlix Coban wraps 04/26/16; continued gradual improvement in terms of depth and management as well as condition of the wound base. Surgical debridement done she continues with Hydrofera Blue. 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 She initially came to this clinic venous insufficiency ulcers which have long since healed 05/17/16 continued improvement in terms of the depth and measurements of this wound although she has a tightly adherent fibrinous slough each time. We've been continued with long standing Hydrofera Blue which seems to done as well for this wound is many advanced treatment options. 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 She also has chronic venous insufficiency. She has an irritation on her lateral right ankle secondary to our wraps 05/10/16; wound appears to be smaller especially on the medial aspect and especially in the width. Wound was debridement surface looks better. She is also been in the hospital apparently with anemia again she tells me she had an endoscopy. Since she got home after 3 days which I believe was sometime last week she has had an irritated painful area on the right lateral ankle surrounding the lateral malleolus 05/31/16; much more adherent surface slough today then recently although I don't think the dimensions of changed that much. A more aggressive debridement is required. The irritated area over her medial malleolus is more pruritic and painful and I don't think represents cellulitis 06/14/16; no major change in her wound dimensions however there is more tightly adherent surface slough which is disappointing. As well as she appears to have a new small area medially. Furthermore an irritated uncomfortable area on the lateral aspect of her right foot just below the lateral malleolus. 06/21/16; I'm seeing the patient back in follow-up for the new areas under her major wound on the right anterior leg. She has been using Hydrofera Blue to this area probably for several months now and although the dressings seem to be helping for quite a period of time I think things have stagnated  lately. She comes in today with a relatively tight adherent surface slough and really no changes in the wound shape or dimension. The 2 small areas she had inferiorly are tiny but still open they seem improved this well. There is no uncontrolled edema and I don't think there is any evidence of cellulitis. 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 Patient has had vascular evaluations both venous and arterial. I have biopsied this area. There is no obvious infection. The worrisome thing today is that she seems to be developing areas of erythema and epithelial damage on the medial aspect of the right foot. Also to a lesser degree inferior to the actual wound itself. Again I see no obvious changes to suggest cellulitis however as this is the only treatable option I will probably give her antibiotics. 07/13/16 no major change in the lady's large anterior right leg wound. Still covered with a very tightly  adherent surface slough which is difficult to debridement. There is less erythema around this, culture last week grew pseudomonas I gave her ciprofloxacin. The area on her lateral right malleolus looks better- 08/02/16 the patient's wounds continued to decline. Her original large anterior right leg wound looks deeper. Still adherent surface slough that is difficult to debridement. She has a small area just below this and a punched-out wound just below her lateral malleolus. In the meantime she is been in hospital with apparently an upper GI bleed on Plavix and aspirin. She is now just on Plavix she received 3 units of packed cells 08/23/16; since I last saw this 3 weeks ago, the open large area on her right leg looks about the same syrup. She has a small satellite lesion just underneath this. The area on her medial right ankle is now a deep necrotic wound. I attempted to debridement this however there is  just too much pain. 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 She follows with vascular surgery and is scheduled for an angiogram in early September 09/06/16; the patient is going for an arteriogram tomorrow. Her original large wound on the right calf is about the same the satellite lesion underneath it is about the same however the area on her medial ankle is now deeper with exposed tendon. I am no longer attempting to debride these wounds 09/20/16; the patient has undergone a right femoral endarterectomy and Dacron patch angioplasty. This seems to have helped the flow in her right leg. 10/04/16; Arrived today for aggressive debridement of the wounds on her right calf the original wound the one beneath it and a difficult area over her right lateral leg just above the lateral malleolus. 10/25/16; her 3 open wounds are about the same in terms of dimensions however the surface appears a lot healthier post debridement. Using Iodoflex 10/18/16 we have been using Iodoflex to her wounds which she tolerated with some difficulty. 10/11/16; has been using Santyl for a period of time with some improvement although again very adherent surface slough would prevent any attempted healing this. She has a original wound on the left calf, the satellite underneath that and the most recent wound on the right medial ankle. She has completed revascularization by Dr. early and has had venous ablation earlier. Want to go back to Iodoflex to see if week and get a healthier surface to this wound bed failing this I think she'll need to be taken to the OR and I am prepared to call Dr. Marla Roe to discuss this. She is obviously not a good candidate for general anesthesia however.; 11/08/16; I put her on Iodoflex last time to see if I can get the wound bed any healthier and unfortunately today still had tremendous surface slough. 11/15/16; 4 weeks' worth of  Iodoflex with not much improvement. Debridement on the major wounds on her left anterior leg is easier however this does not maintain from week to week. The punched-out area on her medial right ankle 11/29/16; I attempted to change the patient last visit to [facility] however she states this burned and was very uncomfortable therefore we gave her permission to go back to the eye out of complex which she already had at home. Also she noted a lot of pain and swelling on the lateral aspect of her leg before she traveled to [name] for the holidays, I called her in doxycycline over the  phone. This seems to have helped 12/06/16; Wounds unchanged by in large. Using Iodoflex 12/13/16; her wounds today actually looks somewhat better. The area on the right lateral lower leg has reasonably healthy-looking granulation and perhaps as actually filled and a bit. Debridement of the 2 wounds on the medial calf is easier and post debridement appears to have a healthier base. We have referred her to Dr. Migdalia Dk for consideration of operative debridement 12/20/16; we have a quick note from Dr. Merri Ray who feels that the patient needs to be referred to an academic center/plastic surgeon. This is due to the complexity of the patient's medical issues as it applies to anything in the OR. We have been using Iodoflex 12/27/16; in general the wounds on the right leg are better in terms of the difficult to remove surface slough. She has been using Iodoflex. She is approved for Apligraf which I anticipated ordering in the next week or 2 when we get a better-looking surface 01/04/16 the deep wounds on the right leg generally look better. Both of them are debrided further surface slough. The area on the lateral right leg was not debrided. She is approved for Apligraf I think I'll probably order this either next week or the week after depending on the surface of the wounds superiorly. We have been using  Iodoflex which will continue until then 01/11/16; the deep wounds on the right leg again have a surface slough that requires debridement. I've not been able to get the wound bed on either one of these wounds down to what would be acceptable for an advanced skin stab-like Apligraf. The area on the medial leg has been improving. We have been using hot Iodoflex to all wounds which seems to do the best at at least limiting the nonviable surface 01/24/17; we have continued Iodoflex and all her wound areas. Her debridement Gen. he is easier and the surface underneath this looks viable. Nevertheless these are large area wounds with exposed muscle at least on the anterior parts. We have ordered Apligraf's for 2 weeks from now. The patient will be away next week 02/07/17; the patient was close to have first Apligraf today however we did not order one. I therefore replaced her Iodoflex. She essentially has 3 large punched- out areas on her right anterior leg and right medial ankle. 02/11/17; Apligraf #1 02/25/17; Apligraf #2. In general some improvement in the right medial ankle and right anterior leg wounds. The larger wound medially perhaps some better 03/11/17; Apligraf #3. In general continued improvement in the right medial ankle and the right anterior leg wounds. 03/25/17 Apligraf #4. In general continued improvement especially on the right medial ankle and the lower 04/08/17; Apligraf #5 in general continued improvement in all wound sites. 04/22/17; post Apligraf #5 her wound beds continued to look a lot better all of them up to the surface of the surrounding skin. Had a caramel-colored slough that I did not debrided in case there is residual Apligraf effect. The wounds are as good as I have seen these looking quite some time. 04/29/17; we applied [place] and last week after completing her Apligraf. Wounds look as though they've contracted somewhat although they have a nonviable surface which was  problematic in the past. Apparently she has been approved for further Apligraf's. We applied Iodosorb today after debridement. 05/06/17; we're fortunate enough today to be able to apply additional Apligraf approved by her insurance. In general all of her wounds look better Apligraf #6 05/24/17; Apligraf #7  continued improvement in all wounds o3 06/10/17 Apligraf #8. Continued improvement in the surface of all wounds. Not much of an improvement in dimensions as I might a follow 06/24/17 Apligraf #9 continued general improvement although not as much change in the wound areas I might of like. She has a new open area on the right anterior lateral ankle very small and superficial. She also has a necrotic wound on the tip of her right index finger probably secondary to severe uncontrolled Raynaud's phenomenon. She is already on sildenafil and already seen her rheumatologist who gave her Keflex. 07/08/17; Apligraf #10. Generalized improvement although she has a small additional wound just medial to the major wound area. 07/22/17; after discussion we decided not to reorder any further Apligraf's although there is been considerable improvement with these it hasn't been recently. The major wound anteriorly looks better. Smaller wounds beneath this and the more recent one and laterally look about the same. The area on the right lateral lower leg looks about the same 07/29/17 this is a patient who is exceedingly complex. She has advanced scleroderma, crest syndrome including calcinosis, PAD status post revascularization, chronic venous insufficiency status post ablations. She initially presented to this clinic with wounds on her bilateral lower legs however these closed. More recently we have been dealing with a large open area superiorly on the right anterior leg, a smaller wound underneath this and then another one on the right medial lower extremity. These improve significantly with 10 Apligraf applications. Over the  last 2-3 weeks we are making good progress with Hydrofera Blue and these seem to be making progress towards closure 08/19/17; wounds continued to make good improvement with Hydrofera Blue and episodic aggressive debridements 08/26/17; still using Hydrofera Blue. Good improvements 09/09/17; using Hydrofera Blue continued improvement. Area on the lateral part of her right leg has only a small remaining open area. The small inferior satellite region is for all intents and purposes closed. Her major wound also is come in in terms of depth and has advancing epithelialization. 09/16/17; using Hydrofera Blue with continued improvement. The smaller satellite wound. We've closed out today along with a new were satellite wound medially. The area on her medial ankle is still open and her major wound is still open but making improvement. All using Hydrofera Blue. Currently 09/30/17; using Hydrofera Blue. Still a small open area on the lateral right ankle area and her original major wound seems to be making gradual and steady improvement. 10/14/17; still using Hydrofera Blue. Still too small open areas on the right lateral ankle. Her original major wound is horizontal and linear. The most problematic area paradoxically seems to be the area to the medial area wears I thought it would be the lateral. 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 10/28/17; still using Hydrofera Blue. Right lateral ankle has a very small open area superiorly on the most lateral part of the wound. Her original open wound has 2 open areas now separated by normal skin and we've redefined this. 11/11/17; still using Hydrofera Blue area and right lateral ankle continues to have a small open area on mostly the lateral part of the wound. Her original wound has 2 small open areas now separated by a considerable amount of normal skin 11/28/17; the patient called in slightly before Thanksgiving to  report pain and erythema above the wound on the right leg. In the past this is responded well to treatment for cellulitis and I  gave her over the phone doxycycline. She stated this resulted in fairly abrupt improvement. We have been using Hydrofera Blue for a prolonged period of time to the larger wound anteriorly into the remaining wound on her right right lateral ankle. The latter is just about closed with only a small linear area and the bottom of the [place]. 12/02/17; use endoform and left the dressing on since last visit. There is no tenderness and no evidence of infection. 12/16/17; patient has been using Endoform but not making much progress. The 2 punched out open areas anteriorly which were the reminiscence of her major wound appear deeper. The area on the lateral aspect of her right calf also appears deeper. Also she has a puzzling tender swelling above her wound on the right leg. This seems larger than last time. Just above her wounds there appears to be some fluctuance in this area it is not erythematous and there is no crepitus 12/30/17; patient has been using Endoform up until last week we used Hydrofera Blue. Ultrasound of the swelling above her 2 major wounds last time was negative for a fluid collection. I gave her cefaclor for the erythema and tenderness in this area which seems better. Unfortunately both punched out areas anteriorly and the area on her right medial lower leg appear deeper. In fact the lateral of the wounds anteriorly actually looks as though it has exposed tendon and/or muscle sheath. She is not systemically unwell. She is complaining of vaginitis type symptoms presumably Candida from her antibiotics. 01/06/18; we're using santyl. she has 2 punched out areas anteriorly which were initially part of a large wound. Unfortunately medially this is now open to tendon/muscle. All the wounds have the same adherent very difficult to debride surface. 01/20/18; 2 week follow-up  using Santyl. She has the 2 punched out areas anteriorly which were initially part of her large surface wound there. Medially this still has exposed muscle. All of these have the same tightly adherent necrotic surface which requires debridement. PuraPly was not accepted by the patient's insurance however her insurance I think it changed therefore we are going to run Apligraf to gain 02/03/18; the patient has been using Santyl. The wound on the right lateral ankle looks improved and the 2 areas anteriorly on the right leg looks about the same. The medial one has exposed muscle. The lateral 1 requiresdebridement. We use PuraPly today for the first week 02/10/18; PuraPly #2. The patient has 3 wounds. The area on the right lateral ankle, 2 areas anteriorly that were part of her original large wound in this area the medial one has exposed muscle. All of the wounds were lightly debrided with a number 3 curet. PuraPly #2 applied the lateral wound on the calf and the right lateral ankle look better. 02/17/18; PuraPly #3. Patient has 3 wounds. The area on the right lateral ankle in 2 areas internally that were part of her original large wound. The lateral area has exposed muscle. She arrived with some complaints of pain around the right ankle. 02/24/18; PuraPly #4; not much change in any of the 3 wound areas. Right lateral ankle, right lateral calf. Both of these required debridement with a #3 curet. She tolerates this marginally. The area on the medial leg still has exposed muscle. Not much change in dimensions 03/03/18 PuraPly #5. The area on the medial ankle actually looks better however the 2 separate areas that were original parts of the larger right anterior leg wound look as though they're  attempting to coalesce. 03/10/18; PuraPly #6. The area on the medial ankle actually continues to look and measures smaller however the 2 separate areas that were part of the original large wound on the right anterior leg have  now coalesced. There hasn't been much improvement here. The lateral area actually has underlying exposed muscle 03/17/18-she is here in follow-up evaluation for ulcerations to the right lower extremity. She is voicing no complaints or concerns. She tolerated debridement. Puraply#7 placed 03/24/18; difficult right lower extremity ulcerations. PuraPly #8 place. She is been approved for [employer]. She did very well with Apligraf today however she is apparently reached her "lifetime max" 03/31/18; marginal improvement with PuraPly although her wounds looked as good as they have in several weeks today. Used TheraSkins #1 04/14/18 TheraSkin #2 today 04/28/18 TheraSkins #3. Wound slightly improved 05/12/18; TheraSkin skin #4. Wound response has been variable. 05/27/18 TheraSkin #5. Generally improvement in all wound areas. I've also put her in 3 layer compression to help with the severe venous hypertension 06/09/18; patient is done quite well with the TheraSkins unfortunately we have no further applications. I also put her in 3 layer compression last week and that really seems to of helped. 06/16/18; we have been using silver collagen. Wounds are smaller. Still be open area to the muscle layer of her calf however even that is contracted somewhat. She tells me that at night sometimes she has pain on the right lateral calf at the site of her lower wound. Notable that I put her into 3 layer compression about 3 weeks ago. She states that she dangles her leg over the bed that makes it feel better but she does not describe claudication during the day ooShe is going to call her secondary insurance to see if they will continue to cover advanced treatment products I have reviewed her arterial studies from 01/22/17; this showed an ABI in the right of 1 and on the left noncompressible. TBI on the right at 0.30 on the left at 0.34. It is therefore possible she has significant PAD with medial calcification falsely elevating her  ABI into the normal range. I'll need to be careful about asking her about this next week it's possible the 3 layer compression is too much 06/23/18; was able to reapply TheraSkin 1 today. Edema control is good and she is not complaining of pain no claudication 07/07/18;no major change. New wound which was apparently a taper removal injury today in our clinic between her 2 wounds on the right calf TheraSkins #2 07/14/18; I think there is some improvement in the right lateral ankle and the medial part of her wound. There is still exposed muscle medially. 07/28/18; two-week follow-up. TheraSkins#3. Unfortunately no major change. She is not a candidate I don't think for skin grafting due to severe venous hypertension associated with her scleroderma and pulmonary hypertension 08/11/18 Patient is here today for her [employer] application #08 (#5 of the second set). She seems to be doing well and in the base of the wound appears to show some progress at this point. This is the last approved [employer] of the second set. 08/25/18; she has completed TheraSkin. There has been some improvement on the right lateral calf wound as well as the anterior leg wounds. The open area to muscle medially on the anterior leg wound is smaller. I'm going to transition her back to [facility] under Kerlix Coban change every second day. She reports that she had some calcification removed from her right upper arm. We  have had previous problems with calcifications in her wounds on her legs but that has not happened recently 09/08/18;using Hydrofera Blue on both her wound areas. Wounds seem to of contracted nicely. She uses Kerlix Coban wrap and changes at home herself 09/22/2018; using Hydrofera Blue on both her wound areas. Dimensions seem to have come down somewhat. There is certainly less depth in the medial part of the mid tibia wound and I do not think there is any exposed muscle at this point. 10/06/2018; 2-week follow-up. Using  [facility] on her wound areas. Dimensions have come down nicely both on the right lateral ankle area in the right mid tibial area. She has no new complaints 10/20/2018; 2-week follow-up. She is using Hydrofera Blue. Not much change from the last time she was here. The area on the lateral ankle has less depth although it has raised edges on one side. I attempted to remove as much of the raised edge as I could without creating more additional wounds. The area on the right anterior mid tibia area looks the same. 11/03/2018; 2-week follow-up she is using Hydrofera Blue. On the right anterior leg she now has 2 wounds separated by a large area of normal skin. The area on the medial part still has I think exposed muscle although this area itself is a lot smaller. The area laterally has some depth. Both areas with necrotic debris. ooThe area on her right lateral ankle has come in nicely 11/17/2018; patient continues to use Hydrofera Blue. We have been increasing separation of the 2 wounds anteriorly which were at one point joint the area on the right lateral calf continues to have I think some improvement in depth. 12/08/2018; patient continues to use Hydrofera Blue. There is some improvement in the area on the right lateral calf. The 2 areas that were initially part of the original large wound in the mid right tibia are probably about the same. In fact the medial area is probably somewhat larger. We will run puraply through the patient's insurance 12/22/2018; she has been using Hydrofera Blue. We have small wounds on the right lateral calf and 2 small areas that were initially part of a large wound in the right mid tibia. We applied pure apply #1 today. 12/29/2018; we applied puraply #2. Her wounds look somewhat better especially on the right lateral calf and the lateral part of her original wound in the mid tibial area. 01/05/2019; perhaps slightly improved in terms of wound bed condition but certainly  not as much improvement as I might of liked. Puraply #3 1/13: we did not have a correct sized puraply to apply. wounds more pinched out looking, I increased her compression to 3 layer last week to help with significant multilevel venous hypertension. Since then I've reviewed her arterial status. She has a right femoral endarterectomy and a distal left SFA stent. She was being followed by Dr. [name] for a period however she does not appear to have seen him in 3 years. I will set up an appointment. 1/20. The patient has an additional wound on the right lateral calf between the distal wound and proximal wounds. We did not have Puraply last week. Still does not have a follow-up with Dr. early 1/27: Follow-up with Dr. early has been arranged apparently with follow-up noninvasive studies. Wounds are measuring roughly the same although they certainly look smaller 2/3; the patient had non invasive studies. Her ABIs on the right were 0.83 and on the left 1.02 however there  was no great toe pressure bilaterally. Also worrisome monophasic waveforms at the PTA and dorsalis pedis. We are still using Puraply. We have had some improvement in all of the wounds especially the lateral part of the mid tibial area. 2/10; sees Dr. early of vein and vascular re-arterial studies next week. Puraply reapplied today. 2/17; Dr. early of vein and vascular his appointment is tomorrow. Puraply reapplied after debridement of all wounds 2/24; the patient saw Dr. early I reviewed his note. sHe noted the previous right femoral endarterectomy with a Dacron patch. He also noted the normal ABI and the monophasic waveforms suggesting tibial disease. Overall he did not feel that she had any evidence of arterial insufficiency that would impair her wound healing. He did note her venous disease as well. He suggested PRN follow-up. 3/2; I had the last puraply applied today. The original wounds over the mid tibia area are improved where is the  area on the right lower leg is not 3/9; wounds are smaller especially in the right mid tibia perhaps slightly in the right lateral calf. We finished with puraply and went to endoform today 3/23; the patient arrives after 2 weeks. She has been using endoform. I think all of her wounds look slightly better which includes the area on the right lateral calf just above the right lateral malleolus and the 2 in the right mid tibia which were initially part of the same wound. 4/27; TELEHEALTH visit; the patient was seen for telehealth visit today with her consent in the middle of the worldwide epidemic. Since she was last here she called in for antibiotics with pain and tenderness around the area on the right medial ankle. I gave her empiric doxycycline. She states this feels better. She is using endoform on both of these areas 5/11 TELEHEALTH; the patient was seen for telehealth visit today. She was accompanied at home by her husband. She has severe pulmonary hypertension accompanied scleroderma and in the face of the Covid epidemic cannot be safely brought into our clinic. We have been using endoform on her wound areas. There are essentially 3 wound areas now in the left mid tibia now 2 open areas that it one point were connected and one on the right lateral ankle just above the malleolus. 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 She cannot come to clinic. The wounds were reviewed today via telehealth. She has severe chronic venous hypertension which I think is centrally mediated. She has wounds on her right anterior tibia and right lateral ankle area. These are chronic. She has been using endoform. 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 We have been following her from telehealth. She has severe chronic venous hypertension which may be mostly centrally mediated secondary to right heart heart failure. She has wounds on her right anterior tibia and right lateral ankle these are chronic we have been using endoform 6/22; TELEHEALTH; this patient was seen today via telehealth. 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 Since the last time we had contact with this patient she developed some pain and erythema around the wound on her right lateral malleolus/ankle  and we put in antibiotics for her. This is resulted in good improvement with resolution of the erythema and tenderness. I changed her to silver alginate last time. We had been using endoform for an extended period of time 7/13; TELEHEALTH; this patient was seen today via telehealth. 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 She would continue to be at a prohibitive risk to be brought into our clinic unless this was absolutely necessary. These visits have been done with her approval as well as her husband. We have been using silver alginate to the areas on the right mid tibia and right lateral lower leg. 7/27 TELEHEALTH; patient was seen along with her husband today via telehealth. 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 We changed her to sample last visit. She has 2 areas a chronic wound on her right mid tibia and one just above her ankle. These were not the original wounds when she came into this clinic but she developed them during treatment 8/17; she comes in for her first face-to-face visit in a long period. She has a remaining area just medial to the right tibia which is the last open part of her large wound across  this area. She also has an area on the right lateral lower leg. We prescribed Santyl last telehealth visit but they were concerned that this was making a deeper so they put silver alginate on it last week. Her husband changes the dressings. 8/31; using Santyl to the 2 wound areas some improvement in wound surfaces. Husband has surgery in 2 weeks we will put her out 3 weeks. Any of the advanced treatment options that I can think of that would be eligible for this wound would also cause her to have to come in weekly. The risk that the patient is just too high 9/21. Using Santyl to the 2 wound areas. Both of these are somewhat better although the medial mid tibia area still has exposed muscle. Lateral ankle requiring debridement. Using Santyl 10/12; using Santyl to the 2 wound areas. One on the right lateral ankle and the other in the medial calf which still has exposed muscle. Both areas have come down in size and have better looking surfaces. She has made nice progress with santyl 11/2; we have been using Santyl to the 2 wound areas. Right lateral ankle and the other in the mid tibia area the medial part of this still has exposed muscle. 11/23/2019 on evaluation today patient appears to be doing about the same really with regard to her wounds. She is actually not very pleased with how things seem to be progressing at this point she tells me that she really has not noted much improvement unfortunately. With that being said there is no signs of active infection at this time. There is some slough buildup noted at this time which again along with some dry skin around the edges of the wound I think would benefit her to try to debride some of this away. Fortunately her pain is doing fairly well. She still has exposed muscle in the right medial/tibial area. 12/14; TELEHEALTH; she was changed to [facility] to the right calf wound and right lateral ankle wound when she was here last time.  Unfortunately since then she had a fall with a pelvic fracture and a fracture of her wrist. She was apparently hospitalized for 5 days. I have not  looked at her discharge summary. She apparently came out of the hospital with a blister on her right heel. She was seen today via telehealth by myself and our case manager. The patient and her husband were present. She has been using Hydrofera Blue at our direction from the last time she was in the clinic. There is been no major improvement in fact the areas appear deeper and with a less viable surface 01/04/2020; TELEHEALTH; the patient was seen today in accompaniment of her husband and our nurse. She has 3 open areas 1 on the right medial mid tibia, one on the right lateral ankle and a large eschared pressure area on the right heel. We have been using Iodoflex to the 2 original wounds. 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 It is simply too perilous for her to be seen in any other way 1/26; TELEHEALTH; the patient was seen today in accompaniment of her husband and our RN one of our nurses. She still has the 3 open areas 1 on the right medial mid tibia which is the remanent of a more extensive wound in this area, one on the right lateral ankle and a large eschared pressure area on the right heel. We have been using Iodoflex to the 2 original wounds and a bed at 9 application to the eschared area on the heel 2/15; the first time we have seen this patient and then several months out of concern for the pandemic. She had a large horizontal wound in the mid tibia. Only the medial aspect of this is still open. Area on the lateral ankle is just about closed. She had a new pressure ulcer on the right heel which I have removed some of the eschar. We have been using Iodoflex which I will continue. The area in the mid tibia has a round circle in the middle of exposed muscle. I think we would have to use an advanced treatment product  to stimulate granulation over this area. We will run this through her insurance. She is not eligible for plastic surgery for many reasons 3/1; TELEHEALTH. The patient was seen today by telehealth. 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 We have been using Iodoflex to the wound areas which include the right anterior mid tibia, right lateral ankle and the right heel. All of these were reviewed 3/8; the patient's wound just above the right ankle is closed. She still has a contracting black eschar on the heel where she had a pressure ulcer. The medial part of her original wound on the mid tibia has exposed muscle. We have made really made no progress in this area although we have managed to get a lot of the original wound in this area to close. We used Apligraf #1 today 3/22; the patient's ankle wound remains closed. She still has a contracting black eschar on the heel although it seems to have less surface area. It still not clear whether there is any depth here. We used Apligraf #2 today in an attempt to get granulation over the exposed muscle and what is left of her mid tibia area. 4/5; everything is closed except the medial aspect of the mid tibia wound, as well as the pressure injury on the right heel. She has been using Betadine to the right heel which has been gradually contracting. We used Apligraf #3 today. 4/19; Apligraf #4. Still a pressure area on  the right heel she has been using Betadine superficial excoriated areas she has been applying salicylic acid to on the anterior leg and the thick horn on the leg just above her wound area 5/3; Apligraf #5. Unfortunately she came in today with a reopening of the area on the right lateral lower leg. Not much change in the wound we have been treating in the mid calf. Quite a bit of edema surrounding this wound. She reports that she was up on her feet  quite a bit. The area on the right heel is separating she has been using Betadine She comes in with a new wound on the left lateral malleolus which is very disappointing this is been open since last week 5/17; we applied her last Apligraf last time. Unfortunately that did not have any effect on the deep area medially in the mid tibial area. However the area over the right lateral malleolus is a lot better. Right heel also is contracted we used Iodoflex last time. The new wound on the left lateral malleolus were using Santyl to. This required debridement. 6/1. Not much change in the area on the right medial mid tibia. Right lateral ankle is improved she has the necrotic area on the right heel which was a pressure area. New area on the left lateral malleolus from last time. I changed her to Iodoflex to the area on the left lateral malleolus she said this hurt I have put her back on Santyl 6/15; right mid tibia is unchanged. I do not think there is anything I can do to this topically that we will get this to granulate over the muscle. And I am furthermore I am not sure that she is a surgical candidate either because of her severe pulmonary issues or because she really does not want to go through it. ooThe area on the right lateral malleolus is a small punched out area. ooThe area on the left lateral malleolus again small punched out with nonviable surface ooPressure ulcer on the right heel at separating black eschar. I went ahead and remove this today Lura Em has an area on the left anterior mid tibia just laterally. Geographic wounds debris on the surface. Some erythema here. 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 There is skin changes widely Change the primary dressing to Sorbact to all wound areas. She needs compression on the left leg as well as the right 6/21; right mid tibia which was her one remaining wound at 1 point is unchanged ooThe area on the right lateral  malleolus actually is close to closing over still a small punched out area ooThe area on the left lateral malleolus again a deep wound it is this 1 that she feels pain in. ooPressure ulcer on the right heel was cultured today. ooNew area on the left anterior mid tibia several geographic wounds last week in the setting of chronic venous insufficiency this actually looks some better 7/6; ooRight mid tibia about the same. Exposed muscle ooLeft lateral malleolus again necrotic debris over the surface. ooPressure ulcer on the right heel. She finished the Keflex we prescribed. Necrotic debris debrided with a #3 curette ooThe rest of her wounds on the right mid tibia right lateral ankle are better Her husband reminds me that she had a right femoral endarterectomy and has 2 stents in her left thigh placed in 2011 approximately by vascular surgery in [place]. She saw Dr. [name] last in February 2020. At that point he did not think that  the arterial insufficiency she had on the right was sufficient to explain any of her right lower extremity wounds however she now has an area on the left lateral malleolus that looks like an ischemic wound Objective Constitutional Patient is hypertensive.. Pulse regular and within target range for patient.Marland Kitchen Respirations regular, non-labored and within target range.. Temperature is normal and within the target range for the patient.Marland Kitchen Appears in no distress. Vitals Time Taken: 3:59 PM, Height: 68 in, Weight: 132 lbs, BMI: 20.1, Temperature: 98.3 F, Pulse: 75 bpm, Respiratory Rate: 20 breaths/min, Blood Pressure: 177/68 mmHg. General Notes: Wound exam ooMidportion of the right tibia about the same still exposed muscle no debridement. ooRight lateral ankle is closed ooLeft anterior ankle was not debrided today. I have not been able to get to a viable surface ooRight heel which was initially a pressure ulcer debrided of necrotic fat subcutaneous  tissue Integumentary (Hair, Skin) Wound #16 status is Open. Original cause of wound was Pressure Injury. The wound is located on the Right Calcaneus. The wound measures 0.6cm length x 1.5cm width x 0.7cm depth; 0.707cm^2 area and 0.495cm^3 volume. There is Fat Layer (Subcutaneous Tissue) Exposed exposed. There is no tunneling noted, however, there is undermining starting at 11:00 and ending at 3:00 with a maximum distance of 0.4cm. There is a small amount of purulent drainage noted. The wound margin is distinct with the outline attached to the wound base. There is no granulation within the wound bed. There is a large (67-100%) amount of necrotic tissue within the wound bed including Adherent Slough. Wound #19 status is Open. Original cause of wound was Gradually Appeared. The wound is located on the Right,Lateral Malleolus. The wound measures 0.2cm length x 0.2cm width x 0.1cm depth; 0.031cm^2 area and 0.003cm^3 volume. There is Fat Layer (Subcutaneous Tissue) Exposed exposed. There is no tunneling or undermining noted. There is a small amount of serous drainage noted. The wound margin is flat and intact. There is no granulation within the wound bed. There is a large (67-100%) amount of necrotic tissue within the wound bed including Adherent Slough. Wound #20 status is Open. Original cause of wound was Gradually Appeared. The wound is located on the Left,Lateral Malleolus. The wound measures 0.8cm length x 0.8cm width x 0.3cm depth; 0.503cm^2 area and 0.151cm^3 volume. There is Fat Layer (Subcutaneous Tissue) Exposed exposed. There is no tunneling or undermining noted. There is a medium amount of serosanguineous drainage noted. The wound margin is distinct with the outline attached to the wound base. There is no granulation within the wound bed. There is a large (67-100%) amount of necrotic tissue within the wound bed including Adherent Slough. Wound #21 status is Open. Original cause of wound was  Gradually Appeared. The wound is located on the Left,Lateral Lower Leg. The wound measures 0.8cm length x 0.4cm width x 0.2cm depth; 0.251cm^2 area and 0.05cm^3 volume. There is Fat Layer (Subcutaneous Tissue) Exposed exposed. There is no tunneling or undermining noted. There is a small amount of serous drainage noted. The wound margin is flat and intact. There is no granulation within the wound bed. There is a large (67-100%) amount of necrotic tissue within the wound bed including Adherent Slough. Wound #22 status is Open. Original cause of wound was Gradually Appeared. The wound is located on the Left,Distal,Lateral Lower Leg. The wound measures 0.5cm length x 0.4cm width x 0.1cm depth; 0.157cm^2 area and 0.016cm^3 volume. There is Fat Layer (Subcutaneous Tissue) Exposed exposed. There is no tunneling or undermining  noted. There is a small amount of serous drainage noted. The wound margin is flat and intact. There is no granulation within the wound bed. There is a large (67-100%) amount of necrotic tissue within the wound bed including Adherent Slough. Wound #5 status is Open. Original cause of wound was Gradually Appeared. The wound is located on the Right,Medial Lower Leg. The wound measures 1.7cm length x 1.5cm width x 0.5cm depth; 2.003cm^2 area and 1.001cm^3 volume. There is Fat Layer (Subcutaneous Tissue) Exposed exposed. There is no tunneling or undermining noted. There is a medium amount of serosanguineous drainage noted. The wound margin is distinct with the outline attached to the wound base. There is small (1-33%) pink granulation within the wound bed. There is a large (67-100%) amount of necrotic tissue within the wound bed including Adherent Slough. 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 There was a Excisional Skin/Subcutaneous Tissue Debridement with a total area of 0.9 sq cm performed by Ricard Dillon., MD. With the following instrument(s): Curette to remove Viable and Non-Viable tissue/material. Material removed includes Subcutaneous Tissue and Slough and. No specimens were taken. A time out was conducted at 16:33, prior to the start of the procedure. A Minimum amount of bleeding was controlled with Pressure. The procedure was tolerated well with a pain level of 0 throughout and a pain level of 0 following the procedure. Post Debridement Measurements: 0.6cm length x 1.5cm width x 0.7cm depth; 0.495cm^3 volume. Post debridement Stage noted as Unstageable/Unclassified. Character of Wound/Ulcer Post Debridement is improved. Post procedure Diagnosis Wound #16: Same as Pre-Procedure Pre-procedure diagnosis of Wound #16 is a Pressure Ulcer located on the Right Calcaneus . There was a Three Layer Compression Therapy Procedure by Levan Hurst, RN. Post procedure Diagnosis Wound #16: Same as Pre-Procedure Wound #19 Pre-procedure diagnosis of Wound #19 is a Venous Leg Ulcer located on the Right,Lateral Malleolus . There was a Three Layer Compression Therapy Procedure by Levan Hurst, RN. Post procedure Diagnosis Wound #19: Same as Pre-Procedure Wound #20 Pre-procedure diagnosis of Wound #20 is an Arterial Insufficiency Ulcer located on the Left,Lateral Malleolus . There was a Three Layer Compression Therapy Procedure by Levan Hurst,  RN. Post procedure Diagnosis Wound #20: Same as Pre-Procedure Wound #21 Pre-procedure diagnosis of Wound #21 is a Venous Leg Ulcer located on the Left,Lateral Lower Leg . There was a Three Layer Compression Therapy Procedure by Levan Hurst, RN. Post procedure Diagnosis Wound #21: Same as Pre-Procedure Wound #22 Pre-procedure diagnosis of Wound #22 is a Venous Leg Ulcer located on the Left,Distal,Lateral Lower Leg . There was a Three Layer Compression Therapy Procedure by Levan Hurst, RN. Post procedure Diagnosis Wound #22: Same as Pre-Procedure Wound #5 Pre-procedure diagnosis of Wound #5 is a Venous Leg Ulcer located on the Right,Medial Lower Leg . There was a Three Layer Compression Therapy Procedure by Levan Hurst, RN. Post procedure Diagnosis Wound #5: Same as Pre-Procedure Plan Follow-up Appointments: Return  Appointment in 1 week. Dressing Change Frequency: Do not change entire dressing for one week. Skin Barriers/Peri-Wound Care: Moisturizing lotion - both legs Wound Cleansing: May shower with protection. Primary Wound Dressing: Wound #16 Right Calcaneus: Cutimed Sorbact Wound #19 Right,Lateral Malleolus: Cutimed Sorbact Wound #20 Left,Lateral Malleolus: Cutimed Sorbact Wound #21 Left,Lateral Lower Leg: Cutimed Sorbact Wound #5 Right,Medial Lower Leg: Cutimed Sorbact Secondary Dressing: Wound #16 Right Calcaneus: Dry Gauze Heel Cup Wound #19 Right,Lateral Malleolus: Dry Gauze Wound #20 Left,Lateral Malleolus: Dry Gauze Wound #21 Left,Lateral Lower Leg: Dry Gauze Wound #5 Right,Medial Lower Leg: Dry Gauze Edema Control: 3 Layer Compression System - Bilateral Avoid standing for long periods of time Elevate legs to the level of the heart or above for 30 minutes daily and/or when sitting, a frequency of: - throughout the day Off-Loading: Turn and reposition every 2 hours Other: - float heels off of bed/chair with pillow under calves Consults ordered  were: Vascular Surgeon - Dr. [name] - non healing ulcers on bilateral lower legs/feet 1. Continue Sorbact all wound areas 2. I would like Dr. [name] to revisit the arterial issues here. She has stents in the left thigh apparently placed by a surgeon in [place] in roughly 2011. The area on the left lateral malleolus looks like an arterial issue 3. Still requiring debridement in the right heel. Electronic Signature(s) Signed: 07/05/2020 5:09:59 PM By: Linton Ham MD Entered By: Linton Ham on 07/05/2020 16:58:29 -------------------------------------------------------------------------------- SuperBill Details Patient Name: Date of Service: [name] [name], [name] [name] 07/05/2020 Medical Record Number: [id number] Patient Account Number: [id number] Date of Birth/Sex: Treating RN: 1951/04/21 (69 y.o. 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 I87.331 Chronic venous hypertension (idiopathic) with ulcer and inflammation of right lower extremity L89.616 Pressure-induced deep tissue damage of right heel L97.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 L97.828 Non-pressure chronic ulcer of other part of left lower leg with other specified severity Facility Procedures CPT4 Code: 43154008 Description: 67619 - DEB SUBQ TISSUE 20 SQ CM/< ICD-10 Diagnosis Description L89.616 Pressure-induced deep tissue damage of right heel Modifier: Quantity: 1 CPT4 Code: 50932671 Description: (Facility Use Only) 29581LT - APPLY MULTLAY COMPRS LWR LT  LEG Modifier: 59 Quantity: 1 Physician Procedures : CPT4 Code Description Modifier [id number] 11042 - WC PHYS SUBQ TISS 20 SQ CM ICD-10 Diagnosis Description L89.616 Pressure-induced deep tissue damage of right heel Quantity: 1 Electronic Signature(s) Signed: 07/05/2020 5:26:11 PM By: Levan Hurst RN, BSN Signed: 07/06/2020 4:34:16 AM By: Linton Ham MD Previous Signature: 07/05/2020 5:09:59 PM Version By: Linton Ham MD Entered By: Levan Hurst on 07/05/2020 17:13:46

## 2020-07-08 ENCOUNTER — Other Ambulatory Visit: Payer: Medicare Other | Admitting: Internal Medicine

## 2020-07-08 ENCOUNTER — Other Ambulatory Visit: Payer: Self-pay

## 2020-07-08 DIAGNOSIS — S52502D Unspecified fracture of the lower end of left radius, subsequent encounter for closed fracture with routine healing: Secondary | ICD-10-CM | POA: Diagnosis not present

## 2020-07-08 DIAGNOSIS — D508 Other iron deficiency anemias: Secondary | ICD-10-CM

## 2020-07-08 DIAGNOSIS — E039 Hypothyroidism, unspecified: Secondary | ICD-10-CM | POA: Diagnosis not present

## 2020-07-08 DIAGNOSIS — R790 Abnormal level of blood mineral: Secondary | ICD-10-CM

## 2020-07-08 DIAGNOSIS — M349 Systemic sclerosis, unspecified: Secondary | ICD-10-CM

## 2020-07-11 ENCOUNTER — Encounter (HOSPITAL_BASED_OUTPATIENT_CLINIC_OR_DEPARTMENT_OTHER): Payer: Medicare Other | Admitting: Internal Medicine

## 2020-07-11 DIAGNOSIS — L89616 Pressure-induced deep tissue damage of right heel: Secondary | ICD-10-CM | POA: Diagnosis not present

## 2020-07-11 DIAGNOSIS — L97811 Non-pressure chronic ulcer of other part of right lower leg limited to breakdown of skin: Secondary | ICD-10-CM | POA: Diagnosis not present

## 2020-07-11 DIAGNOSIS — I83222 Varicose veins of left lower extremity with both ulcer of calf and inflammation: Secondary | ICD-10-CM | POA: Diagnosis not present

## 2020-07-11 DIAGNOSIS — I87331 Chronic venous hypertension (idiopathic) with ulcer and inflammation of right lower extremity: Secondary | ICD-10-CM | POA: Diagnosis not present

## 2020-07-11 DIAGNOSIS — L97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: Secondary | ICD-10-CM | POA: Diagnosis not present

## 2020-07-11 DIAGNOSIS — L97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: Secondary | ICD-10-CM | POA: Diagnosis not present

## 2020-07-12 ENCOUNTER — Encounter: Payer: Self-pay | Admitting: Internal Medicine

## 2020-07-12 ENCOUNTER — Other Ambulatory Visit: Payer: Self-pay

## 2020-07-12 ENCOUNTER — Ambulatory Visit (INDEPENDENT_AMBULATORY_CARE_PROVIDER_SITE_OTHER): Payer: Medicare Other | Admitting: Internal Medicine

## 2020-07-12 VITALS — BP 140/60 | HR 74 | Ht 68.0 in | Wt 122.0 lb

## 2020-07-12 DIAGNOSIS — I2729 Other secondary pulmonary hypertension: Secondary | ICD-10-CM | POA: Diagnosis not present

## 2020-07-12 DIAGNOSIS — D649 Anemia, unspecified: Secondary | ICD-10-CM

## 2020-07-12 DIAGNOSIS — E782 Mixed hyperlipidemia: Secondary | ICD-10-CM | POA: Diagnosis not present

## 2020-07-12 DIAGNOSIS — M349 Systemic sclerosis, unspecified: Secondary | ICD-10-CM | POA: Diagnosis not present

## 2020-07-12 DIAGNOSIS — I10 Essential (primary) hypertension: Secondary | ICD-10-CM | POA: Diagnosis not present

## 2020-07-12 DIAGNOSIS — Z8572 Personal history of non-Hodgkin lymphomas: Secondary | ICD-10-CM

## 2020-07-12 DIAGNOSIS — E039 Hypothyroidism, unspecified: Secondary | ICD-10-CM | POA: Diagnosis not present

## 2020-07-12 DIAGNOSIS — M810 Age-related osteoporosis without current pathological fracture: Secondary | ICD-10-CM

## 2020-07-12 DIAGNOSIS — Z8659 Personal history of other mental and behavioral disorders: Secondary | ICD-10-CM | POA: Diagnosis not present

## 2020-07-12 DIAGNOSIS — H903 Sensorineural hearing loss, bilateral: Secondary | ICD-10-CM | POA: Diagnosis not present

## 2020-07-12 DIAGNOSIS — N1831 Chronic kidney disease, stage 3a: Secondary | ICD-10-CM | POA: Diagnosis not present

## 2020-07-12 MED ORDER — DENOSUMAB 60 MG/ML ~~LOC~~ SOSY
60.0000 mg | PREFILLED_SYRINGE | Freq: Once | SUBCUTANEOUS | Status: AC
  Administered 2020-07-12: 60 mg via SUBCUTANEOUS

## 2020-07-12 NOTE — Progress Notes (Addendum)
[name] [name], [name] [name] ([id number]) Visit Report for 07/11/2020 Arrival Information Details Patient Name: Date of Service: [name] [name] 07/11/2020 12:45 PM Medical Record Number: [id number] Patient Account Number: [id number] Date of Birth/Sex: Treating RN: 11-20-1951 (69 y.o. [name] [name] Primary Care Lamond Glantz: [name], [place] RY Other Clinician: Referring Doloros Kwolek: Treating Nasrin Lanzo/Extender: [name], MA RY Weeks in Treatment: 233 Visit Information History Since Last Visit Added or deleted any medications: No Patient Arrived: Ambulatory Any new allergies or adverse reactions: No Arrival Time: 13:12 Had a fall or experienced change in No Accompanied By: self activities of daily living that may affect Transfer Assistance: None risk of falls: Patient Identification Verified: Yes Signs or symptoms of abuse/neglect since last visito No Secondary Verification Process Completed: Yes Hospitalized since last visit: No Patient Requires Transmission-Based Precautions: No Implantable device outside of the clinic excluding No Patient Has Alerts: No cellular tissue based products placed in the center since last visit: Has Dressing in Place as Prescribed: Yes Has Compression in Place as Prescribed: Yes Pain Present Now: No Electronic Signature(s) Signed: 07/12/2020 7:38:35 AM By: Kela Millin Entered By: Kela Millin on 07/11/2020 13:12:38 -------------------------------------------------------------------------------- Compression Therapy Details Patient Name: Date of Service: [name] [name] 07/11/2020 12:45 PM Medical Record Number: [id number] Patient Account Number: [id number] Date of Birth/Sex: Treating RN: 1951-02-18 (69 y.o. [name] Primary Care Lamari Youngers: [name], [place] R[place] Other Clinician: Referring Jalana Moore: Treating Delanie Tirrell/Extender: [name], MA RY Weeks in Treatment: 433 Compression Therapy Performed for Wound  Assessment: Wound #16 Right Calcaneus Performed By: Clinician Levan Hurst, RN Compression Type: Three Layer Post Procedure Diagnosis Same as Pre-procedure Electronic Signature(s) Signed: 07/11/2020 5:04:17 PM By: Levan Hurst RN, BSN Entered By: Levan Hurst on 07/11/2020 13:51:19 -------------------------------------------------------------------------------- Compression Therapy Details Patient Name: Date of Service: [name] [name] 07/11/2020 12:45 PM Medical Record Number: [id number] Patient Account Number: [id number] Date of Birth/Sex: Treating RN: [birth year] (69 y.o. [name] Primary Care Shilah Hefel: [name], [place] R[place] Other Clinician: Referring Sharis Keeran: Treating Jamika Sadek/Extender: [name], MA RY Weeks in Treatment: 433 Compression Therapy Performed for Wound Assessment: Wound #19 Right,Lateral Malleolus Performed By: Clinician Levan Hurst, RN Compression Type: Three Layer Post Procedure Diagnosis Same as Pre-procedure Electronic Signature(s) Signed: 07/11/2020 5:04:17 PM By: Levan Hurst RN, BSN Entered By: Levan Hurst on 07/11/2020 13:51:19 -------------------------------------------------------------------------------- Compression Therapy Details Patient Name: Date of Service: [name] [name] 07/11/2020 12:45 PM Medical Record Number: [id number] Patient Account Number: [id number] Date of Birth/Sex: Treating RN: 04-Jan-1951 (69 y.o. [name] Primary Care Margaretta Chittum: [name], [place] R[place] Other Clinician: Referring Zaahir Pickney: Treating Ruhama Lehew/Extender: [name], MA RY Weeks in Treatment: 433 Compression Therapy Performed for Wound Assessment: Wound #20 Left,Lateral Malleolus Performed By: Clinician Levan Hurst, RN Compression Type: Three Layer Post Procedure Diagnosis Same as Pre-procedure Electronic Signature(s) Signed: 07/11/2020 5:04:17 PM By: Levan Hurst RN, BSN Entered By: Levan Hurst on  07/11/2020 13:51:20 -------------------------------------------------------------------------------- Compression Therapy Details Patient Name: Date of Service: [name] [name] 07/11/2020 12:45 PM Medical Record Number: [id number] Patient Account Number: [id number] Date of Birth/Sex: Treating RN: [birth year] (69 y.o. [name] Primary Care Regina Ganci: [name], [place] Other Clinician: Referring Margaret Staggs: Treating Jashon Ishida/Extender: [name], MA RY Weeks in Treatment: 433 Compression Therapy Performed for Wound Assessment: Wound #21 Left,Lateral Lower Leg Performed By: Clinician Levan Hurst, RN Compression Type: Three  Layer Post Procedure Diagnosis Same as Pre-procedure Electronic Signature(s) Signed: 07/11/2020 5:04:17 PM By: Levan Hurst RN, BSN Entered By: Levan Hurst on 07/11/2020 13:51:20 -------------------------------------------------------------------------------- Compression Therapy Details Patient Name: Date of Service: [name] [name] 07/11/2020 12:45 PM Medical Record Number: [id number] Patient Account Number: [id number] Date of Birth/Sex: Treating RN: 1951-04-19 (69 y.o. [name] Primary Care Chin Wachter: [name], [place] Other Clinician: Referring Arhianna Ebey: Treating Jenaya Saar/Extender: [name], MA RY Weeks in Treatment: 433 Compression Therapy Performed for Wound Assessment: Wound #22 Left,Distal,Lateral Lower Leg Performed By: Clinician Levan Hurst, RN Compression Type: Three Layer Post Procedure Diagnosis Same as Pre-procedure Electronic Signature(s) Signed: 07/11/2020 5:04:17 PM By: Levan Hurst RN, BSN Entered By: Levan Hurst on 07/11/2020 13:51:20 -------------------------------------------------------------------------------- Compression Therapy Details Patient Name: Date of Service: [name] [name] 07/11/2020 12:45 PM Medical Record Number: [id number] Patient Account Number:  [id number] Date of Birth/Sex: Treating RN: Jan 01, 1951 (69 y.o. [name] Primary Care Addalyn Speedy: [name], [place] R[place] Other Clinician: Referring Keyaira Clapham: Treating Hilaria Titsworth/Extender: [name], MA RY Weeks in Treatment: 433 Compression Therapy Performed for Wound Assessment: Wound #5 Right,Medial Lower Leg Performed By: Clinician Levan Hurst, RN Compression Type: Three Layer Post Procedure Diagnosis Same as Pre-procedure Electronic Signature(s) Signed: 07/11/2020 5:04:17 PM By: Levan Hurst RN, BSN Entered By: Levan Hurst on 07/11/2020 13:51:20 -------------------------------------------------------------------------------- Encounter Discharge Information Details Patient Name: Date of Service: [name] [name] 07/11/2020 12:45 PM Medical Record Number: [id number] Patient Account Number: [id number] Date of Birth/Sex: Treating RN: 25-Mar-1951 (69 y.o. [name] [name] Primary Care Lorrin Bodner: Other Clinician: [name], [place] RY Referring Karan Ramnauth: Treating Dulcinea Kinser/Extender: [name], MA RY Weeks in Treatment: [phone] Encounter Discharge Information Items Post Procedure Vitals Discharge Condition: Stable Temperature (F): 98.4 Ambulatory Status: Ambulatory Pulse (bpm): 77 Discharge Destination: Home Respiratory Rate (breaths/min): 18 Transportation: Private Auto Blood Pressure (mmHg): 171/62 Accompanied By: self Schedule Follow-up Appointment: Yes Clinical Summary of Care: Patient Declined Electronic Signature(s) Signed: 07/12/2020 5:34:53 PM By: Baruch Gouty RN, BSN Entered By: Baruch Gouty on 07/11/2020 14:32:30 -------------------------------------------------------------------------------- Lower Extremity Assessment Details Patient Name: Date of Service: [name] LO [name] [name] 07/11/2020 12:45 PM Medical Record Number: [id number] Patient Account Number: [id number] Date of Birth/Sex: Treating RN: June 05, 1951 (69 y.o. [name] [name] Primary Care Lariya Kinzie: [name], [place] R[place] Other Clinician: Referring Victorious Cosio: Treating Hugh Garrow/Extender: [name], MA RY Weeks in Treatment: 433 Edema Assessment Assessed: [Left: No] [Right: No] Edema: [Left: Yes] [Right: Yes] Calf Left: Right: Point of Measurement: 36 cm From Medial Instep 31.4 cm 31 cm Ankle Left: Right: Point of Measurement: 10 cm From Medial Instep 21.1 cm 22 cm Vascular Assessment Pulses: Dorsalis Pedis Palpable: [Left:Yes] [Right:Yes] Electronic Signature(s) Signed: 07/12/2020 7:38:35 AM By: Kela Millin Entered By: Kela Millin on 07/11/2020 13:16:18 -------------------------------------------------------------------------------- Multi Wound Chart Details Patient Name: Date of Service: [name] [name] 07/11/2020 12:45 PM Medical Record Number: [id number] Patient Account Number: [id number] Date of Birth/Sex: Treating RN: November 25, 1951 (69 y.o. [name] Primary Care Nira Visscher: [name], [place] R[place] Other Clinician: Referring Nefertari Rebman: Treating Rylie Limburg/Extender: [name], MA RY Weeks in Treatment: 433 Vital Signs Height(in): 68 Pulse(bpm): 77 Weight(lbs): 132 Blood Pressure(mmHg): 171/62 Body Mass Index(BMI): 20 Temperature(F): 98.4 Respiratory Rate(breaths/min): 19 Photos: [16:No Photos Right Calcaneus] [19:No Photos Right, Lateral Malleolus] [20:No Photos Left, Lateral Malleolus] Wound Location: [16:Pressure Injury] [19:Gradually Appeared] [20:Gradually Appeared] Wounding Event: [  16:Pressure Ulcer] [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] Primary Etiology: [16:Anemia, Hypertension, Peripheral] [19:Anemia, Hypertension, Peripheral] [20:Anemia, Hypertension, Peripheral] Comorbid History: [16:Arterial Disease, Peripheral Venous Disease, Raynauds, Scleroderma, Rheumatoid Arthritis, Osteoarthritis 01/04/2020] [19:Arterial Disease, Peripheral Venous Disease, Raynauds, Scleroderma,  Rheumatoid Arthritis, Osteoarthritis  05/02/2020] [20:Arterial Disease, Peripheral Venous Disease, Raynauds, Scleroderma, Rheumatoid Arthritis, Osteoarthritis 05/02/2020] Date Acquired: [16:27] [19:10] [20:10] Weeks of Treatment: [16:Open] [19:Open] [20:Open] Wound Status: [16:No] [19:No] [20:No] Clustered Wound: [16:0.8x1.6x0.8] [19:0.3x0.1x0.1] [20:0.7x0.7x0.4] Measurements L x W x D (cm) [16:1.005] [19:0.024] [20:0.385] A (cm) : rea [63:7.858] [19:0.002] [20:0.154] Volume (cm) : [16:92.00%] [19:83.00%] [20:12.50%] % Reduction in Area: [16:36.00%] [19:92.90%] [20:-75.00%] % Reduction in Volume: [16:Unstageable/Unclassified] [19:Full Thickness Without Exposed] [20:Full Thickness Without Exposed] Classification: [16:Small] [19:Support Structures Small] [20:Support Structures Medium] Exudate Amount: [16:Purulent] [19:Serous] [20:Serosanguineous] Exudate Type: [16:yellow, brown, green] [19:amber] [20:red, brown] Exudate Color: [16:Well defined, not attached] [19:Flat and Intact] [20:Well defined, not attached] Wound Margin: [16:None Present (0%)] [19:Large (67-100%)] [20:None Present (0%)] Granulation Amount: [16:N/A] [19:Pink] [20:N/A] Granulation Quality: [16:Large (67-100%)] [19:None Present (0%)] [20:Large (67-100%)] Necrotic Amount: [16:Fat Layer (Subcutaneous Tissue)] [19:Fat Layer (Subcutaneous Tissue)] [20:Fat Layer (Subcutaneous Tissue)] Exposed Structures: [16:Exposed: Yes Fascia: No Tendon: No Muscle: No Joint: No Bone: No None] [19:Exposed: Yes Fascia: No Tendon: No Muscle: No Joint: No Bone: No Large (67-100%)] [20:Exposed: Yes Fascia: No Tendon: No Muscle: No Joint: No Bone: No None] Epithelialization: [16:Debridement - Excisional] [19:N/A] [20:N/A] Debridement: Pre-procedure Verification/Time Out 13:48 [19:N/A] [20:N/A] Taken: [16:Other] [19:N/A] [20:N/A] Pain Control: [16:Subcutaneous, Slough] [19:N/A] [20:N/A] Tissue Debrided: [16:Skin/Subcutaneous Tissue] [19:N/A]  [20:N/A] Level: [16:1.28] [19:N/A] [20:N/A] Debridement A (sq cm): [16:rea Curette] [19:N/A] [20:N/A] Instrument: [16:Minimum] [19:N/A] [20:N/A] Bleeding: [16:Pressure] [19:N/A] [20:N/A] Hemostasis A chieved: [16:0] [19:N/A] [20:N/A] Procedural Pain: [16:0] [19:N/A] [20:N/A] Post Procedural Pain: [16:Procedure was tolerated well] [19:N/A] [20:N/A] Debridement Treatment Response: [16:0.8x1.6x0.8] [19:N/A] [20:N/A] Post Debridement Measurements L x W x D (cm) [85:0.277] [19:N/A] [20:N/A] Post Debridement Volume: (cm) [16:Unstageable/Unclassified] [19:N/A] [20:N/A] Post Debridement Stage: [16:Compression Therapy] [19:Compression Therapy] [20:Compression Therapy] Procedures Performed: [16:Debridement 21] [19:22] [20:5] Photos: [16:No Photos Left, Lateral Lower Leg] [19:No Photos Left, Distal, Lateral Lower Leg] [20:No Photos Right, Medial Lower Leg] Wound Location: [16:Gradually Appeared] [19:Gradually Appeared] [20:Gradually Appeared] Wounding Event: [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] [19:Venous Leg Ulcer] [20:Venous Leg Ulcer] Primary Etiology: [16:Anemia, Hypertension, Peripheral] [19:Anemia, Hypertension, Peripheral] [20:Anemia, Hypertension, Peripheral] Comorbid History: [16:Arterial Disease, Peripheral Venous Disease, Raynauds, Scleroderma, Rheumatoid Arthritis, Osteoarthritis 06/14/2020] [19:Arterial Disease, Peripheral Venous Disease, Raynauds, Scleroderma, Rheumatoid Arthritis, Osteoarthritis  06/20/2020] [20:Arterial Disease, Peripheral Venous Disease, Raynauds, Scleroderma, Rheumatoid Arthritis, Osteoarthritis 01/13/2013] Date Acquired: [16:3] [19:3] [41:287] Weeks of Treatment: [16:Open] [19:Open] [20:Open] Wound Status: [16:Yes] [19:No] [20:Yes] Clustered Wound: [16:0.8x0.5x0.1] [19:0.4x0.4x0.1] [20:1.5x2.1x0.5] Measurements L x W x D (cm) [16:0.314] [19:0.126] [20:2.474] A (cm) : rea [16:0.031] [19:0.013] [20:1.237] Volume (cm) : [16:82.20%] [19:55.50%] [20:-75.00%] % Reduction in Area:  [16:82.50%] [19:53.60%] [20:-777.30%] % Reduction in Volume: [16:Full Thickness Without Exposed] [19:Full Thickness Without Exposed] [20:Full Thickness With Exposed Support] Classification: [16:Support Structures Small] [19:Support Structures Small] [20:Structures Medium] Exudate A mount: [16:Serous] [19:Serous] [20:Serosanguineous] Exudate Type: [16:amber] [19:amber] [20:red, brown] Exudate Color: [16:Flat and Intact] [19:Flat and Intact] [20:Distinct, outline attached] Wound Margin: [16:None Present (0%)] [19:None Present (0%)] [20:Small (1-33%)] Granulation A mount: [16:N/A] [19:N/A] [20:Pink] Granulation Quality: [16:Large (67-100%)] [19:Large (67-100%)] [20:Large (67-100%)] Necrotic A mount: [16:Fat Layer (Subcutaneous Tissue)] [19:Fat Layer (Subcutaneous Tissue)] [20:Fat Layer (Subcutaneous Tissue)] Exposed Structures: [16:Exposed: Yes Fascia: No Tendon: No Muscle: No Joint: No Bone: No Small (1-33%)] [19:Exposed: Yes Fascia: No Tendon: No Muscle: No Joint: No Bone: No Small (1-33%)] [  20:Exposed: Yes Fascia: No Tendon: No Muscle: No Joint: No Bone: No Small (1-33%)] Epithelialization: [16:N/A] [19:N/A] [20:N/A] Debridement: [16:N/A] [19:N/A] [20:N/A] Pain Control: [16:N/A] [19:N/A] [20:N/A] Tissue Debrided: [16:N/A] [19:N/A] [20:N/A] Level: [16:N/A] [19:N/A] [20:N/A] Debridement A (sq cm): [16:rea N/A] [19:N/A] [20:N/A] Instrument: [16:N/A] [19:N/A] [20:N/A] Bleeding: [16:N/A] [19:N/A] [20:N/A] Hemostasis A chieved: [16:N/A] [19:N/A] [20:N/A] Procedural Pain: [16:N/A] [19:N/A] [20:N/A] Post Procedural Pain: Debridement Treatment Response: N/A [19:N/A] [20:N/A] Post Debridement Measurements L x N/A [19:N/A] [20:N/A] W x D (cm) [16:N/A] [19:N/A] [20:N/A] Post Debridement Volume: (cm) [16:N/A] [19:N/A] [20:N/A] Post Debridement Stage: [16:Compression Therapy] [19:Compression Therapy] [20:Compression Therapy] Treatment Notes Electronic Signature(s) Signed: 07/11/2020 5:04:17 PM By:  Levan Hurst RN, BSN Signed: 07/12/2020 8:12:39 AM By: Linton Ham MD Entered By: Linton Ham on 07/11/2020 13:53:34 -------------------------------------------------------------------------------- Multi-Disciplinary Care Plan Details Patient Name: Date of Service: [name] LO [name] Hora F. 07/11/2020 12:45 PM Medical Record Number: [id number] Patient Account Number: [id number] Date of Birth/Sex: Treating RN: 14-Jul-1951 (69 y.o. [name] Primary Care Maybelline Kolarik: [name], [place] R[place] Other Clinician: Referring Elham Fini: Treating Braxtyn Dorff/Extender: [name], MA RY Weeks in Treatment: [phone] Active Inactive Wound/Skin Impairment Nursing Diagnoses: Impaired tissue integrity Knowledge deficit related to ulceration/compromised skin integrity Goals: Patient/caregiver will verbalize understanding of skin care regimen Date Initiated: 10/11/2016 Target Resolution Date: 08/05/2020 Goal Status: Active Interventions: Assess patient/caregiver ability to obtain necessary supplies Assess patient/caregiver ability to perform ulcer/skin care regimen upon admission and as needed Assess ulceration(s) every visit Provide education on ulcer and skin care Treatment Activities: Skin care regimen initiated : 10/11/2016 Topical wound management initiated : 10/11/2016 Notes: Electronic Signature(s) Signed: 07/11/2020 5:04:17 PM By: Levan Hurst RN, BSN Entered By: Levan Hurst on 07/11/2020 13:56:22 -------------------------------------------------------------------------------- Pain Assessment Details Patient Name: Date of Service: [name] [name] 07/11/2020 12:45 PM Medical Record Number: [id number] Patient Account Number: [id number] Date of Birth/Sex: Treating RN: February 08, 1951 (69 y.o. [name] [name] Primary Care Bernadette Gores: [name], [place] R[place] Other Clinician: Referring Giuseppina Quinones: Treating Derika Eckles/Extender: [name], MA RY Weeks in Treatment: [phone] Active  Problems Location of Pain Severity and Description of Pain Patient Has Paino No Site Locations Pain Management and Medication Current Pain Management: Electronic Signature(s) Signed: 07/12/2020 7:38:35 AM By: Kela Millin Entered By: Kela Millin on 07/11/2020 13:13:16 -------------------------------------------------------------------------------- Patient/Caregiver Education Details Patient Name: Date of Service: [name] [name] 7/12/2021andnbsp12:45 PM Medical Record Number: [id number] Patient Account Number: [id number] Date of Birth/Gender: Treating RN: 01/02/1951 (69 y.o. [name] Primary Care Physician: [name], [place] R[place] Other Clinician: Referring Physician: Treating Physician/Extender: [name], MA RY Weeks in Treatment: [phone] Education Assessment Education Provided To: Patient Education Topics Provided Wound/Skin Impairment: Methods: Explain/Verbal Responses: State content correctly Electronic Signature(s) Signed: 07/11/2020 5:04:17 PM By: Levan Hurst RN, BSN Entered By: Levan Hurst on 07/11/2020 13:56:40 -------------------------------------------------------------------------------- Wound Assessment Details Patient Name: Date of Service: [name] [name] 07/11/2020 12:45 PM Medical Record Number: [id number] Patient Account Number: [id number] Date of Birth/Sex: Treating RN: 29-Jan-1951 (69 y.o. [name] [name] Primary Care Magic Mohler: [name], [place] R[place] Other Clinician: Referring Silvano Garofano: Treating Shelvia Fojtik/Extender: [name], MA RY Weeks in Treatment: 433 Wound Status Wound Number: 16 Primary Pressure Ulcer Etiology: Wound Location: Right Calcaneus Wound Open Wounding Event: Pressure Injury Status: Date Acquired: 01/04/2020 Comorbid Anemia, Hypertension, Peripheral Arterial Disease, Peripheral Weeks Of Treatment: 27 History: Venous Disease, Raynauds, Scleroderma, Rheumatoid Arthritis, Clustered Wound:  No Osteoarthritis Photos Photo Uploaded  ByMikeal Hawthorne on 07/12/2020 15:25:12 Wound Measurements Length: (cm) 0.8 Width: (cm) 1.6 Depth: (cm) 0.8 Area: (cm) 1.005 Volume: (cm) 0.804 % Reduction in Area: 92% % Reduction in Volume: 36% Epithelialization: None Tunneling: No Undermining: No Wound Description Classification: Unstageable/Unclassified Wound Margin: Well defined, not attached Exudate Amount: Small Exudate Type: Purulent Exudate Color: yellow, brown, green Wound Bed Granulation Amount: None Present (0%) Necrotic Amount: Large (67-100%) Necrotic Quality: Adherent Slough Foul Odor After Cleansing: No Slough/Fibrino Yes Exposed Structure Fascia Exposed: No Fat Layer (Subcutaneous Tissue) Exposed: Yes Tendon Exposed: No Muscle Exposed: No Joint Exposed: No Bone Exposed: No Treatment Notes Wound #16 (Right Calcaneus) 2. Periwound Care Moisturizing lotion 3. Primary Dressing Applied Hydrogel or K-Y Jelly Other primary dressing (specifiy in notes) 4. Secondary Dressing Dry Gauze 6. Support Layer Applied 3 layer compression wrap Notes sorbact swab Electronic Signature(s) Signed: 07/12/2020 7:38:35 AM By: Kela Millin Entered By: Kela Millin on 07/11/2020 13:28:45 -------------------------------------------------------------------------------- Wound Assessment Details Patient Name: Date of Service: [name] [name] 07/11/2020 12:45 PM Medical Record Number: [id number] Patient Account Number: [id number] Date of Birth/Sex: Treating RN: 04/06/1951 (69 y.o. [name] [name] Primary Care Kyisha Fowle: [name], [place] R[place] Other Clinician: Referring Mohannad Olivero: Treating Bernardina Cacho/Extender: [name], MA RY Weeks in Treatment: 433 Wound Status Wound Number: 19 Primary Venous Leg Ulcer Etiology: Wound Location: Right, Lateral Malleolus Wound Open Wounding Event: Gradually Appeared Status: Date Acquired: 05/02/2020 Comorbid Anemia,  Hypertension, Peripheral Arterial Disease, Peripheral Weeks Of Treatment: 10 History: Venous Disease, Raynauds, Scleroderma, Rheumatoid Arthritis, Clustered Wound: No Osteoarthritis Photos Photo Uploaded By: Mikeal Hawthorne on 07/12/2020 15:24:46 Wound Measurements Length: (cm) 0.3 Width: (cm) 0.1 Depth: (cm) 0.1 Area: (cm) 0.024 Volume: (cm) 0.002 % Reduction in Area: 83% % Reduction in Volume: 92.9% Epithelialization: Large (67-100%) Tunneling: No Undermining: No Wound Description Classification: Full Thickness Without Exposed Support Structures Wound Margin: Flat and Intact Exudate Amount: Small Exudate Type: Serous Exudate Color: amber Foul Odor After Cleansing: No Slough/Fibrino No Wound Bed Granulation Amount: Large (67-100%) Exposed Structure Granulation Quality: Pink Fascia Exposed: No Necrotic Amount: None Present (0%) Fat Layer (Subcutaneous Tissue) Exposed: Yes Tendon Exposed: No Muscle Exposed: No Joint Exposed: No Bone Exposed: No Treatment Notes Wound #19 (Right, Lateral Malleolus) 2. Periwound Care Moisturizing lotion 3. Primary Dressing Applied Hydrogel or K-Y Jelly Other primary dressing (specifiy in notes) 4. Secondary Dressing Dry Gauze 6. Support Layer Applied 3 layer compression wrap Notes sorbact swab Electronic Signature(s) Signed: 07/12/2020 7:38:35 AM By: Kela Millin Entered By: Kela Millin on 07/11/2020 13:27:03 -------------------------------------------------------------------------------- Wound Assessment Details Patient Name: Date of Service: [name] [name] 07/11/2020 12:45 PM Medical Record Number: [id number] Patient Account Number: [id number] Date of Birth/Sex: Treating RN: 18-Jul-1951 (69 y.o. 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, Lateral Malleolus Wound Open Wounding Event: Gradually Appeared Status: Date Acquired: 05/02/2020 Comorbid Anemia, Hypertension, Peripheral Arterial Disease, Peripheral Weeks Of Treatment: 10 History: Venous Disease, Raynauds, Scleroderma, Rheumatoid Arthritis, Clustered Wound: No Osteoarthritis Photos Photo Uploaded By: Mikeal Hawthorne on 07/12/2020 15:24:05 Wound Measurements Length: (cm) 0.7 Width: (cm) 0.7 Depth: (cm) 0.4 Area: (cm) 0.38 Volume: (cm) 0.15 % Reduction in Area: 12.5% % Reduction in Volume: -75% Epithelialization: None 5 Tunneling: No 4 Undermining: No Wound Description Classification: Full Thickness Without Exposed Support  Struc Wound Margin: Well defined, not attached Exudate Amount: Medium Exudate Type: Serosanguineous Exudate Color: red, brown tures Foul Odor After Cleansing: No Slough/Fibrino Yes Wound Bed Granulation Amount: None Present (0%) Exposed Structure Necrotic Amount: Large (67-100%) Fascia Exposed: No Necrotic Quality: Adherent Slough Fat Layer (Subcutaneous Tissue) Exposed: Yes Tendon Exposed: No Muscle Exposed: No Joint Exposed: No Bone Exposed: No Treatment Notes Wound #20 (Left, Lateral Malleolus) 2. Periwound Care Moisturizing lotion 3. Primary Dressing Applied Hydrogel or K-Y Jelly Other primary dressing (specifiy in notes) 4. Secondary Dressing Dry Gauze 6. Support Layer Applied 3 layer compression wrap Notes sorbact swab Electronic Signature(s) Signed: 07/12/2020 7:38:35 AM By: Kela Millin Entered By: Kela Millin on 07/11/2020 13:22:45 -------------------------------------------------------------------------------- Wound Assessment Details Patient Name: Date of Service: [name] [name] 07/11/2020 12:45 PM Medical Record Number: [id number] Patient Account Number: [id number] Date of Birth/Sex: Treating RN: 05-09-1951 (69 y.o. [name] [name] Primary Care Mahmud Keithly: [name], [place] RY Other Clinician: Referring Jeancarlo Leffler: Treating Brianny Soulliere/Extender: [name], MA RY Weeks in Treatment: 433 Wound Status Wound Number: 21 Primary Venous Leg Ulcer Etiology: Wound Location: Left, Lateral Lower Leg Wound Open Wounding Event: Gradually Appeared Status: Date Acquired: 06/14/2020 Comorbid Anemia, Hypertension, Peripheral Arterial Disease, Peripheral Weeks Of Treatment: 3 History: Venous Disease, Raynauds, Scleroderma, Rheumatoid Arthritis, Clustered Wound: Yes Osteoarthritis Photos Photo Uploaded By: Mikeal Hawthorne on 07/12/2020 15:24:06 Wound Measurements Length: (cm) 0.8 Width: (cm) 0.5 Depth: (cm) 0.1 Area: (cm) 0.314 Volume: (cm) 0.031 % Reduction in Area: 82.2% % Reduction in Volume: 82.5% Epithelialization: Small (1-33%) Tunneling: No Undermining: No Wound Description Classification: Full Thickness Without Exposed Support Structures Wound Margin: Flat and Intact Exudate Amount: Small Exudate Type: Serous Exudate Color: amber Foul Odor After Cleansing: No Slough/Fibrino Yes Wound Bed Granulation Amount: None Present (0%) Exposed Structure Necrotic Amount: Large (67-100%) Fascia Exposed: No Necrotic Quality: Adherent Slough Fat Layer (Subcutaneous Tissue) Exposed: Yes Tendon Exposed: No Muscle Exposed: No Joint Exposed: No Bone Exposed: No Treatment Notes Wound #21 (Left, Lateral Lower Leg) 2. Periwound Care Moisturizing lotion 3. Primary Dressing Applied Hydrogel or K-Y Jelly Other primary dressing (specifiy in notes) 4. Secondary Dressing Dry Gauze 6. Support Layer Applied 3 layer compression wrap Notes sorbact swab Electronic Signature(s) Signed: 07/12/2020 7:38:35 AM By: Kela Millin Entered By: Kela Millin on 07/11/2020 13:25:25 -------------------------------------------------------------------------------- Wound Assessment Details Patient  Name: Date of Service: [name] [name] 07/11/2020 12:45 PM Medical Record Number: [id number] Patient Account Number: [id number] Date of Birth/Sex: Treating RN: 05/20/1951 (69 y.o. [name] [name] Primary Care Shamera Yarberry: [name], [place] RY Other Clinician: Referring Milani Lowenstein: Treating Gilberte Gorley/Extender: [name], MA RY Weeks in Treatment: 433 Wound Status Wound Number: 22 Primary Venous Leg Ulcer Etiology: Wound Location: Left, Distal, Lateral Lower Leg Wound Open Wounding Event: Gradually Appeared Status: Date Acquired: 06/20/2020 Comorbid Anemia, Hypertension, Peripheral Arterial Disease, Peripheral Weeks Of Treatment: 3 History: Venous Disease, Raynauds, Scleroderma, Rheumatoid Arthritis, Clustered Wound: No Osteoarthritis Photos Photo Uploaded By: Mikeal Hawthorne on 07/12/2020 15:24:46 Wound Measurements Length: (cm) 0.4 Width: (cm) 0.4 Depth: (cm) 0.1 Area: (cm) 0.126 Volume: (cm) 0.013 % Reduction in Area: 55.5% % Reduction in Volume: 53.6% Epithelialization: Small (1-33%) Tunneling: No Undermining: No Wound Description Classification: Full Thickness Without Exposed Support Structures Wound Margin: Flat and Intact Exudate Amount: Small Exudate Type: Serous Exudate Color: amber Foul Odor After Cleansing: No Slough/Fibrino Yes Wound Bed Granulation Amount: None Present (0%) Exposed Structure Necrotic Amount: Large (67-100%) Fascia Exposed:  No Necrotic Quality: Adherent Slough Fat Layer (Subcutaneous Tissue) Exposed: Yes Tendon Exposed: No Muscle Exposed: No Joint Exposed: No Bone Exposed: No Treatment Notes Wound #22 (Left, Distal, Lateral Lower Leg) 2. Periwound Care Moisturizing lotion 3. Primary Dressing Applied Hydrogel or K-Y Jelly Other primary dressing (specifiy in notes) 4. Secondary Dressing Dry Gauze 6. Support Layer Applied 3 layer compression wrap Notes sorbact swab Electronic Signature(s) Signed: 07/12/2020 7:38:35  AM By: Kela Millin Entered By: Kela Millin on 07/11/2020 13:23:20 -------------------------------------------------------------------------------- Wound Assessment Details Patient Name: Date of Service: [name] [name] 07/11/2020 12:45 PM Medical Record Number: [id number] Patient Account Number: [id number] Date of Birth/Sex: Treating RN: 08/08/1951 (69 y.o. [name] [name] Primary Care Frederika Hukill: [name], [place] RY Other Clinician: Referring Allante Whitmire: Treating Theia Dezeeuw/Extender: [name], MA RY Weeks in Treatment: 433 Wound Status Wound Number: 5 Primary Venous Leg Ulcer Etiology: Wound Location: Right, Medial Lower Leg Wound Open Wounding Event: Gradually Appeared Status: Date Acquired: 01/13/2013 Comorbid Anemia, Hypertension, Peripheral Arterial Disease, Peripheral Weeks Of Treatment: 389 History: Venous Disease, Raynauds, Scleroderma, Rheumatoid Arthritis, Clustered Wound: Yes Osteoarthritis Photos Photo Uploaded By: Mikeal Hawthorne on 07/12/2020 15:25:13 Wound Measurements Length: (cm) 1.5 Width: (cm) 2.1 Depth: (cm) 0.5 Area: (cm) 2.474 Volume: (cm) 1.237 % Reduction in Area: -75% % Reduction in Volume: -777.3% Epithelialization: Small (1-33%) Tunneling: No Undermining: No Wound Description Classification: Full Thickness With Exposed Support Structures Wound Margin: Distinct, outline attached Exudate Amount: Medium Exudate Type: Serosanguineous Exudate Color: red, brown Foul Odor After Cleansing: No Slough/Fibrino Yes Wound Bed Granulation Amount: Small (1-33%) Exposed Structure Granulation Quality: Pink Fascia Exposed: No Necrotic Amount: Large (67-100%) Fat Layer (Subcutaneous Tissue) Exposed: Yes Necrotic Quality: Adherent Slough Tendon Exposed: No Muscle Exposed: No Joint Exposed: No Bone Exposed: No Treatment Notes Wound #5 (Right, Medial Lower Leg) 2. Periwound Care Moisturizing lotion 3. Primary Dressing  Applied Hydrogel or K-Y Jelly Other primary dressing (specifiy in notes) 4. Secondary Dressing Dry Gauze 6. Support Layer Applied 3 layer compression wrap Notes sorbact swab Electronic Signature(s) Signed: 07/12/2020 7:38:35 AM By: Kela Millin Entered By: Kela Millin on 07/11/2020 13:30:02 -------------------------------------------------------------------------------- Vitals Details Patient Name: Date of Service: [name] [name] 07/11/2020 12:45 PM Medical Record Number: [id number] Patient Account Number: [id number] Date of Birth/Sex: Treating RN: September 04, 1951 (68 y.o. [name] [name] Primary Care Tommi Crepeau: [name], [place] RY Other Clinician: Referring Corine Solorio: Treating Doloris Servantes/Extender: [name], MA RY Weeks in Treatment: 433 Vital Signs Time Taken: 13:10 Temperature (F): 98.4 Height (in): 68 Pulse (bpm): 77 Weight (lbs): 132 Respiratory Rate (breaths/min): 19 Body Mass Index (BMI): 20.1 Blood Pressure (mmHg): 171/62 Reference Range: 80 - 120 mg / dl Electronic Signature(s) Signed: 07/12/2020 7:38:35 AM By: Kela Millin Entered By: Kela Millin on 07/11/2020 13:13:09

## 2020-07-12 NOTE — Progress Notes (Addendum)
[name] [name], [name] [name] ([id number]) Visit Report for 07/11/2020 Debridement Details Patient Name: Date of Service: [name] [name] 07/11/2020 12:45 PM Medical Record Number: [id number] Patient Account Number: [id number] Date of Birth/Sex: Treating RN: Jun 15, 1951 (69 y.o. 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 Performed By: Physician Ricard Dillon., MD Debridement Type: Debridement Level of Consciousness (Pre-procedure): Awake and Alert Pre-procedure Verification/Time Out Yes - 13:48 Taken: Start Time: 13:48 Pain Control: Other : Benzocaine 20% T Area Debrided (L x W): otal 0.8 (cm) x 1.6 (cm) = 1.28 (cm) Tissue and other material debrided: Viable, Non-Viable, Slough, Subcutaneous, Slough Level: Skin/Subcutaneous Tissue Debridement Description: Excisional Instrument: Curette Bleeding: Minimum Hemostasis Achieved: Pressure End Time: 13:49 Procedural Pain: 0 Post Procedural Pain: 0 Response to Treatment: Procedure was tolerated well Level of Consciousness (Post- Awake and Alert procedure): Post Debridement Measurements of Total Wound Length: (cm) 0.8 Stage: Unstageable/Unclassified Width: (cm) 1.6 Depth: (cm) 0.8 Volume: (cm) 0.804 Character of Wound/Ulcer Post Debridement: Requires Further Debridement Post Procedure Diagnosis Same as Pre-procedure Electronic Signature(s) Signed: 07/11/2020 5:04:17 PM By: Levan Hurst RN, BSN Signed: 07/12/2020 8:12:39 AM By: Linton Ham MD Entered By: Levan Hurst on 07/11/2020 13:49:13 -------------------------------------------------------------------------------- HPI Details Patient Name: Date of Service: [name] [name], [name] 07/11/2020 12:45 PM Medical Record Number: [id number] Patient Account Number: [id number] Date of  Birth/Sex: Treating RN: Feb 08, 1951 (69 y.o. [name] [name] Primary Care Provider: Alton [name], [place] RY Other Clinician: Referring Provider: Treating Provider/Extender: [name] Drafts, MA RY Weeks in Treatment: [id number] History of Present Illness HPI Description: this is a patient who initially came to [place] for wounds on the medial malleoli bilaterally as well as her upper medial lower extremities bilaterally. These wounds eventually healed with assistance of Apligraf's bilaterally. While this was occurring she developed the current wound which opened into a fairly substantial wound on the right lower extremity. These are mostly secondary to venous stasis physiology however the patient also has underlying scleroderma, pulmonary hypertension. The wound has been making good progress lately with the Hydrofera Blue-based dressing. 03/17/2015; patient had a arterial evaluation a year ago. Her right ABI was 0.86 left was 1.0. T brachial index was 0.41 on the right 0.45 on the left. Her oe bilateral common femoral artery waveforms were triphasic. Her white popliteal posterior tibial artery and anterior tibial artery waveforms were monophasic with good amplitude. Luteal artery waveforms were biphasic it was felt that her bilateral great toe pressures are of normal although adequate for tissue healing. 03/24/2015. The condition of this wound is not really improved that. He was covered with as fibrinous surface slough and eschar. This underwent an aggressive debridement with both a curette and scalpel. I still cannot really get down to what I can would consider to be a viable surface. There is no evidence of infection. Previous workup for ischemia roughly a year ago was negative nevertheless I think that continues to be a concern 04/07/15. The patient arrived for application of second Apligraf. Once again the surface of this wound is certainly less viable than I would like for an advanced treatment option. An  aggressive debridement was done. She developed some arteriolar bleeding which required that along pressure and silver alginate. 04/21/15: change in the condition of this wound. Once again it is covered in a gelatinous surface slough. After  debridement today surface of the wound looks somewhat better but now a heeling surface 05/05/15 Apligraf #4 applied.Still a lot of slough on this wound. 05/19/15 Apligraf #5 applied. Still a lot of slough on this wound I did not aggressively remove this 06/02/15; continued copious amounts of surface slough. This debridement fairly easily. 06/08/2015 -- the last time she had a venous duplex study done was over 3 years ago and the surgery was prior to that. I have recommended that she sees Dr. early for a another opinion regarding a repeat venous duplex and possibly more endovenous ablation of vein stripping of micro-phlebotomies. 06/16/15; wound has a gelatinous surface eschar that the debridement fairly easily to a point. I don't disagree with the venous workup and perhaps even arterial re-evaluation. She is on prednisone 5 mg and continue his medications for her pulmonary artery hypertension I am not sure if the latter have any wound care healing issues I would need to investigate this. 06/23/15 continues with a gelatinous surface eschar with of fibrinous underlying. What I can see of this wound does not look unhealthy however I just can't get this material which I think is 2 different layers off. Empiric culture done 06/30/15; unfortunately not a lot of change in this wound. A gelatinous surface eschar is easily removed however it has a tight fibrinous surface underneath the. 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 The wound comes back and basically and unchanged state the. She has a gelatinous surface that is more easily removed however there is a tightly fibrinous surface underneath the. There is no evidence of infection. She has a vascular follow-up  next month. I would have to inject her in order to do a more aggressive debridement of this area 07/21/15 the wound is roughly in the same state albeit the debridement is done with greater ease. There is less of the fibrinous eschar underneath the. There is no evidence of infection. She has follow-up with vascular surgery next week. No evidence of surrounding infection. Her original distal wounds healed while this one formed. 07/28/15; wound is easier to debride. No wound erythema. She is seeing Dr. [name] next week. 08/18/15 Has been to [facility] for repeat ablation. Have been using medihoney pad with some improvement 08/25/15; absolutely no change in the condition of this wound in either its overall size or surface condition in many months now. At one point I had this down to [place] healthier surface I think with [facility] however this did not actually progress towards closure. Do not believe that the wound has actually deteriorated in terms of volume at all. We have been using a medihoney pad which allows easier debridement of the gelatinous eschar but again no overall actual improvement. the patient is going towards an ablation with pain and vascular which I think is scheduled for next week. The only other investigation that I could first see would be a biopsy. She does have underlying scleroderma 09/02/15 eschar is much easier to debridement however the base of this does not look particularly vibrant. We changed to Iodoflex. The patient had her ablation earlier this week 09/09/15 again the debridement over the base of this wound is easier and the base of the wound looks considerably better. We will continue the Iodoflex. Dr. Tawni Millers has expressed his satisfaction with the result of her ablation 09/15/15 once again the wound is relatively free of surface eschar. No debridement was done today. It has a pale-looking base to  it. although this is not as deep as it once was it seems to be expanding  especially inferiorly. She has had recent venous ablation but this is no closer to healing.I've gone ahead and done a punch biopsy this from the inferior part of the wound close to normal skin 09/22/15: the wound is relatively free of surface eschar. There is some surrounding eschar. I'm not exactly sure at what level the surface is that I am seeing. 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 No evidence of atypical infection, malignancy. 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 No debridement was done 09/29/15; the wound surface is relatively free of surface eschar. There is eschar surrounding the walls of the wound. Aside from the improvement in the amount of surface slough. This wound has not progressed any towards closure. There is not even a surface that looks like there at this is ready. There is no evidence of any infection nor maligancy based on biopsy I did on 9/15. I continued with the Iodoflex however I am looking towards some alternative to try to promote some closure or filling in of this surface. Consider triple layer Oasis. Collagen did not result in adequate control of the surface slough 10/13/15; the patient was in hospital last week with severe anemia. The wound looks somewhat better after debridement although there is widening medially. There is no evidence of infection. 10/20/15; patient's wound on the right lateral lower leg is essentially unchanged. This underwent a light surface debridement and in general the debridement is easier and the surface looks improved. I noted in doing this on the side of the wound what appears to be a piece of calcium deposition. The patient noted that she had previous things on the tips of her fingers. 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 She follows with rheumatology and I have asked them to talk to her about this. In  view of that S the nonhealing ulcer may have something to do with calcinosis and also unrelieved Raynaud's disease in this area. I should note that her original wounds on the right and left medial malleolus and the inner aspects of both legs just below the knees did however heal over 10/30/15; the patient's wound on the right lateral lower leg is essentially unchanged. I was able to remove some calcified material from the medial wound edge. 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 Otherwise the wound appears essentially unchanged there is less adherent surface eschar. Some of the calcified material was sent to pathology for analysis 11/17/15. The patient's wound on the right lateral leg is essentially unchanged. Wider Medially. 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 There is some surface slough but I did not debridement this. No evidence of surrounding infection. 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 This will make healing difficult 12/15/15; the wound has less depth surface has a fibrinous slough and calcifications in the wound edge no evidence of infection 12/22/15; the wound definitely has less Fibrinous slough on the base. Calcifications around the wound edges are still evident. Although the wound bed looks healthier it is still pale in appearance. Previous  biopsy did not show malignancy 01/04/15; surgical debridement of nonviable slough and subcutaneous tissue the wound cleans up quite nicely but appears to be expanding outward calcifications around the wound edges are still there. Previous biopsy did not show malignancy fungus or vasculitis but a  panniculitis. She is to see her rheumatologist I'll see if he has any opinions on this. My punch biopsy done in September did not show calcifications although these are clearly evident. 01/19/16 light selective debridement of nonviable surface slough. There is epithelialization medially. This gives me reason for cautious optimism. She has been to see her rheumatologist, there is nothing that can be done for this type of soft tissue calcification associated with scleroderma 02/02/16 no debridement although there is a light surface slough. She has 2 peninsulas of skin 1 inferiorly and one medially. We continue to make a slight and slow but definite progressive here 02/16/16; light surface debridement with more attention to the circumference of the wound bed where the fibrinous eschar is more prevalent. No calcifications detected. She seems to have done nicely with the [facility] with some epithelialization and some improvement in the overall wound volume. 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 Continued attention to the circumference of the wound where the fibrinous eschar in calcinosis or prevalent. No calcifications were detected. She has continued improvement with Hydrofera Blue. The wound is no longer as deep 03/15/16 surgical debridement done to remove surface escha Especially around the circumference of the wound where there is nonviable subcutaneous tissue. In spite of this there is considerable improvement in the overall dimensions and depth of the wound. Islands of epithelialization are seen especially medially inferiorly and superiorly to a lesser extent. She is using Hydrofera Blue at home 03/29/16; surgical debridement done to remove surface eschar and nonviable subcutaneous tissue. This cleans up quite nicely mention slightly larger in terms of length and  width however depth is less 04/12/16; continued gradual improvement in terms of depth and the condition of the wound base. Debridement is done. Continuing long standing Hydrofera Blue at home with Kerlix Coban wraps 04/26/16; continued gradual improvement in terms of depth and management as well as condition of the wound base. Surgical debridement done she continues with Hydrofera Blue. 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 She initially came to this clinic venous insufficiency ulcers which have long since healed 05/17/16 continued improvement in terms of the depth and measurements of this wound although she has a tightly adherent fibrinous slough each time. We've been continued with long standing Hydrofera Blue which seems to done as well for this wound is many advanced treatment options. 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 She also has chronic venous insufficiency. She has an irritation on her lateral right ankle secondary to our wraps 05/10/16; wound appears to be smaller especially on the medial aspect and especially in the width. Wound was debridement surface looks better. She is also been in the hospital apparently with anemia again she tells me she had an endoscopy. Since she got home after 3 days which I believe was sometime last week she has had an irritated painful area on the right lateral ankle surrounding the lateral malleolus 05/31/16; much more adherent surface slough today then recently although I don't think the dimensions of changed that much. A more aggressive debridement is  required. The irritated area over her medial malleolus is more pruritic and painful and I don't think represents cellulitis 06/14/16; no major change in her wound dimensions however there is more tightly adherent surface slough which is disappointing. As well as she appears to have a new small area medially. Furthermore an  irritated uncomfortable area on the lateral aspect of her right foot just below the lateral malleolus. 06/21/16; I'm seeing the patient back in follow-up for the new areas under her major wound on the right anterior leg. She has been using Hydrofera Blue to this area probably for several months now and although the dressings seem to be helping for quite a period of time I think things have stagnated lately. She comes in today with a relatively tight adherent surface slough and really no changes in the wound shape or dimension. The 2 small areas she had inferiorly are tiny but still open they seem improved this well. There is no uncontrolled edema and I don't think there is any evidence of cellulitis. 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 Patient has had vascular evaluations both venous and arterial. I have biopsied this area. There is no obvious infection. The worrisome thing today is that she seems to be developing areas of erythema and epithelial damage on the medial aspect of the right foot. Also to a lesser degree inferior to the actual wound itself. Again I see no obvious changes to suggest cellulitis however as this is the only treatable option I will probably give her antibiotics. 07/13/16 no major change in the lady's large anterior right leg wound. Still covered with a very tightly adherent surface slough which is difficult to debridement. There is less erythema around this, culture last week grew pseudomonas I gave her ciprofloxacin. The area on her lateral right malleolus looks better- 08/02/16 the patient's wounds continued to decline. Her original large anterior right leg wound looks deeper. Still adherent surface slough that is difficult to debridement. She has a small area just below this and a punched-out wound just below her lateral malleolus. In the meantime she is been in  hospital with apparently an upper GI bleed on Plavix and aspirin. She is now just on Plavix she received 3 units of packed cells 08/23/16; since I last saw this 3 weeks ago, the open large area on her right leg looks about the same syrup. She has a small satellite lesion just underneath this. The area on her medial right ankle is now a deep necrotic wound. I attempted to debridement this however there is just too much pain. 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 She follows with vascular surgery and is scheduled for an angiogram in early September 09/06/16; the patient is going for an arteriogram tomorrow. Her original large wound on the right calf is about the same the satellite lesion underneath it is about the same however the area on her medial ankle is now deeper with exposed tendon. I am no longer attempting to debride these wounds 09/20/16; the patient has undergone a right femoral endarterectomy and Dacron patch angioplasty. This seems to have helped the flow in her right leg. 10/04/16; Arrived today for aggressive debridement of the wounds on her right calf the original wound the one beneath it and a difficult area over her right lateral leg  just above the lateral malleolus. 10/25/16; her 3 open wounds are about the same in terms of dimensions however the surface appears a lot healthier post debridement. Using Iodoflex 10/18/16 we have been using Iodoflex to her wounds which she tolerated with some difficulty. 10/11/16; has been using Santyl for a period of time with some improvement although again very adherent surface slough would prevent any attempted healing this. She has a original wound on the left calf, the satellite underneath that and the most recent wound on the right medial ankle. She has completed revascularization by Dr. early and has had venous ablation earlier. Want to go back to Iodoflex to see if week and  get a healthier surface to this wound bed failing this I think she'll need to be taken to the OR and I am prepared to call Dr. Marla Roe to discuss this. She is obviously not a good candidate for general anesthesia however.; 11/08/16; I put her on Iodoflex last time to see if I can get the wound bed any healthier and unfortunately today still had tremendous surface slough. 11/15/16; 4 weeks' worth of Iodoflex with not much improvement. Debridement on the major wounds on her left anterior leg is easier however this does not maintain from week to week. The punched-out area on her medial right ankle 11/29/16; I attempted to change the patient last visit to [facility] however she states this burned and was very uncomfortable therefore we gave her permission to go back to the eye out of complex which she already had at home. Also she noted a lot of pain and swelling on the lateral aspect of her leg before she traveled to [facility] for the holidays, I called her in doxycycline over the phone. This seems to have helped 12/06/16; Wounds unchanged by in large. Using Iodoflex 12/13/16; her wounds today actually looks somewhat better. The area on the right lateral lower leg has reasonably healthy-looking granulation and perhaps as actually filled and a bit. Debridement of the 2 wounds on the medial calf is easier and post debridement appears to have a healthier base. We have referred her to Dr. Migdalia Dk for consideration of operative debridement 12/20/16; we have a quick note from Dr. Merri Ray who feels that the patient needs to be referred to an academic center/plastic surgeon. This is due to the complexity of the patient's medical issues as it applies to anything in the OR. We have been using Iodoflex 12/27/16; in general the wounds on the right leg are better in terms of the difficult to remove surface slough. She has been using Iodoflex. She is approved for Apligraf which I  anticipated ordering in the next week or 2 when we get a better-looking surface 01/04/16 the deep wounds on the right leg generally look better. Both of them are debrided further surface slough. The area on the lateral right leg was not debrided. She is approved for Apligraf I think I'll probably order this either next week or the week after depending on the surface of the wounds superiorly. We have been using Iodoflex which will continue until then 01/11/16; the deep wounds on the right leg again have a surface slough that requires debridement. I've not been able to get the wound bed on either one of these wounds down to what would be acceptable for an advanced skin stab-like Apligraf. The area on the medial leg has been improving. We have been using hot Iodoflex to all wounds which seems to do the best at  at least limiting the nonviable surface 01/24/17; we have continued Iodoflex and all her wound areas. Her debridement Gen. he is easier and the surface underneath this looks viable. Nevertheless these are large area wounds with exposed muscle at least on the anterior parts. We have ordered Apligraf's for 2 weeks from now. The patient will be away next week 02/07/17; the patient was close to have first Apligraf today however we did not order one. I therefore replaced her Iodoflex. She essentially has 3 large punched- out areas on her right anterior leg and right medial ankle. 02/11/17; Apligraf #1 02/25/17; Apligraf #2. In general some improvement in the right medial ankle and right anterior leg wounds. The larger wound medially perhaps some better 03/11/17; Apligraf #3. In general continued improvement in the right medial ankle and the right anterior leg wounds. 03/25/17 Apligraf #4. In general continued improvement especially on the right medial ankle and the lower 04/08/17; Apligraf #5 in general continued improvement in all wound sites. 04/22/17; post Apligraf #5 her wound beds continued to look a lot  better all of them up to the surface of the surrounding skin. Had a caramel-colored slough that I did not debrided in case there is residual Apligraf effect. The wounds are as good as I have seen these looking quite some time. 04/29/17; we applied [place] and last week after completing her Apligraf. Wounds look as though they've contracted somewhat although they have a nonviable surface which was problematic in the past. Apparently she has been approved for further Apligraf's. We applied Iodosorb today after debridement. 05/06/17; we're fortunate enough today to be able to apply additional Apligraf approved by her insurance. In general all of her wounds look better Apligraf #6 05/24/17; Apligraf #7 continued improvement in all wounds 3 06/10/17 Apligraf #8. Continued improvement in the surface of all wounds. Not much of an improvement in dimensions as I might a follow 06/24/17 Apligraf #9 continued general improvement although not as much change in the wound areas I might of like. She has a new open area on the right anterior lateral ankle very small and superficial. She also has a necrotic wound on the tip of her right index finger probably secondary to severe uncontrolled Raynaud's phenomenon. She is already on sildenafil and already seen her rheumatologist who gave her Keflex. 07/08/17; Apligraf #10. Generalized improvement although she has a small additional wound just medial to the major wound area. 07/22/17; after discussion we decided not to reorder any further Apligraf's although there is been considerable improvement with these it hasn't been recently. The major wound anteriorly looks better. Smaller wounds beneath this and the more recent one and laterally look about the same. The area on the right lateral lower leg looks about the same 07/29/17 this is a patient who is exceedingly complex. She has advanced scleroderma, crest syndrome including calcinosis, PAD status post  revascularization, chronic venous insufficiency status post ablations. She initially presented to this clinic with wounds on her bilateral lower legs however these closed. More recently we have been dealing with a large open area superiorly on the right anterior leg, a smaller wound underneath this and then another one on the right medial lower extremity. These improve significantly with 10 Apligraf applications. Over the last 2-3 weeks we are making good progress with Hydrofera Blue and these seem to be making progress towards closure 08/19/17; wounds continued to make good improvement with Hydrofera Blue and episodic aggressive debridements 08/26/17; still using Hydrofera Blue. Good improvements 09/09/17;  using Hydrofera Blue continued improvement. Area on the lateral part of her right leg has only a small remaining open area. The small inferior satellite region is for all intents and purposes closed. Her major wound also is come in in terms of depth and has advancing epithelialization. 09/16/17; using Hydrofera Blue with continued improvement. The smaller satellite wound. We've closed out today along with a new were satellite wound medially. The area on her medial ankle is still open and her major wound is still open but making improvement. All using Hydrofera Blue. Currently 09/30/17; using Hydrofera Blue. Still a small open area on the lateral right ankle area and her original major wound seems to be making gradual and steady improvement. 10/14/17; still using Hydrofera Blue. Still too small open areas on the right lateral ankle. Her original major wound is horizontal and linear. The most problematic area paradoxically seems to be the area to the medial area wears I thought it would be the lateral. 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 10/28/17; still using Hydrofera Blue. Right lateral ankle has a very small open area superiorly on the most  lateral part of the wound. Her original open wound has 2 open areas now separated by normal skin and we've redefined this. 11/11/17; still using Hydrofera Blue area and right lateral ankle continues to have a small open area on mostly the lateral part of the wound. Her original wound has 2 small open areas now separated by a considerable amount of normal skin 11/28/17; the patient called in slightly before Thanksgiving to report pain and erythema above the wound on the right leg. In the past this is responded well to treatment for cellulitis and I gave her over the phone doxycycline. She stated this resulted in fairly abrupt improvement. We have been using Hydrofera Blue for a prolonged period of time to the larger wound anteriorly into the remaining wound on her right right lateral ankle. The latter is just about closed with only a small linear area and the bottom of the [place]. 12/02/17; use endoform and left the dressing on since last visit. There is no tenderness and no evidence of infection. 12/16/17; patient has been using Endoform but not making much progress. The 2 punched out open areas anteriorly which were the reminiscence of her major wound appear deeper. The area on the lateral aspect of her right calf also appears deeper. Also she has a puzzling tender swelling above her wound on the right leg. This seems larger than last time. Just above her wounds there appears to be some fluctuance in this area it is not erythematous and there is no crepitus 12/30/17; patient has been using Endoform up until last week we used Hydrofera Blue. Ultrasound of the swelling above her 2 major wounds last time was negative for a fluid collection. I gave her cefaclor for the erythema and tenderness in this area which seems better. Unfortunately both punched out areas anteriorly and the area on her right medial lower leg appear deeper. In fact the lateral of the wounds anteriorly actually looks as though it has  exposed tendon and/or muscle sheath. She is not systemically unwell. She is complaining of vaginitis type symptoms presumably Candida from her antibiotics. 01/06/18; we're using santyl. she has 2 punched out areas anteriorly which were initially part of a large wound. Unfortunately medially this is now open to tendon/muscle. All the wounds have the same adherent very difficult to debride surface.  01/20/18; 2 week follow-up using Santyl. She has the 2 punched out areas anteriorly which were initially part of her large surface wound there. Medially this still has exposed muscle. All of these have the same tightly adherent necrotic surface which requires debridement. PuraPly was not accepted by the patient's insurance however her insurance I think it changed therefore we are going to run Apligraf to gain 02/03/18; the patient has been using Santyl. The wound on the right lateral ankle looks improved and the 2 areas anteriorly on the right leg looks about the same. The medial one has exposed muscle. The lateral 1 requiresdebridement. We use PuraPly today for the first week 02/10/18; PuraPly #2. The patient has 3 wounds. The area on the right lateral ankle, 2 areas anteriorly that were part of her original large wound in this area the medial one has exposed muscle. All of the wounds were lightly debrided with a number 3 curet. PuraPly #2 applied the lateral wound on the calf and the right lateral ankle look better. 02/17/18; PuraPly #3. Patient has 3 wounds. The area on the right lateral ankle in 2 areas internally that were part of her original large wound. The lateral area has exposed muscle. She arrived with some complaints of pain around the right ankle. 02/24/18; PuraPly #4; not much change in any of the 3 wound areas. Right lateral ankle, right lateral calf. Both of these required debridement with a #3 curet. She tolerates this marginally. The area on the medial leg still has exposed muscle. Not much change  in dimensions 03/03/18 PuraPly #5. The area on the medial ankle actually looks better however the 2 separate areas that were original parts of the larger right anterior leg wound look as though they're attempting to coalesce. 03/10/18; PuraPly #6. The area on the medial ankle actually continues to look and measures smaller however the 2 separate areas that were part of the original large wound on the right anterior leg have now coalesced. There hasn't been much improvement here. The lateral area actually has underlying exposed muscle 03/17/18-she is here in follow-up evaluation for ulcerations to the right lower extremity. She is voicing no complaints or concerns. She tolerated debridement. Puraply#7 placed 03/24/18; difficult right lower extremity ulcerations. PuraPly #8 place. She is been approved for [employer]. She did very well with Apligraf today however she is apparently reached her "lifetime max" 03/31/18; marginal improvement with PuraPly although her wounds looked as good as they have in several weeks today. Used TheraSkins #1 04/14/18 TheraSkin #2 today 04/28/18 TheraSkins #3. Wound slightly improved 05/12/18; TheraSkin skin #4. Wound response has been variable. 05/27/18 TheraSkin #5. Generally improvement in all wound areas. I've also put her in 3 layer compression to help with the severe venous hypertension 06/09/18; patient is done quite well with the TheraSkins unfortunately we have no further applications. I also put her in 3 layer compression last week and that really seems to of helped. 06/16/18; we have been using silver collagen. Wounds are smaller. Still be open area to the muscle layer of her calf however even that is contracted somewhat. She tells me that at night sometimes she has pain on the right lateral calf at the site of her lower wound. Notable that I put her into 3 layer compression about 3 weeks ago. She states that she dangles her leg over the bed that makes it feel better but  she does not describe claudication during the day She is going to call her secondary insurance  to see if they will continue to cover advanced treatment products I have reviewed her arterial studies from 01/22/17; this showed an ABI in the right of 1 and on the left noncompressible. TBI on the right at 0.30 on the left at 0.34. It is therefore possible she has significant PAD with medial calcification falsely elevating her ABI into the normal range. I'll need to be careful about asking her about this next week it's possible the 3 layer compression is too much 06/23/18; was able to reapply TheraSkin 1 today. Edema control is good and she is not complaining of pain no claudication 07/07/18;no major change. New wound which was apparently a taper removal injury today in our clinic between her 2 wounds on the right calf TheraSkins #2 07/14/18; I think there is some improvement in the right lateral ankle and the medial part of her wound. There is still exposed muscle medially. 07/28/18; two-week follow-up. TheraSkins#3. Unfortunately no major change. She is not a candidate I don't think for skin grafting due to severe venous hypertension associated with her scleroderma and pulmonary hypertension 08/11/18 Patient is here today for her Theraskin application #50 (#5 of the second set). She seems to be doing well and in the base of the wound appears to show some progress at this point. This is the last approved Theraskin of the second set. 08/25/18; she has completed TheraSkin. There has been some improvement on the right lateral calf wound as well as the anterior leg wounds. The open area to muscle medially on the anterior leg wound is smaller. I'm going to transition her back to [facility] under Kerlix Coban change every second day. She reports that she had some calcification removed from her right upper arm. We have had previous problems with calcifications in her wounds on her legs but that has not happened  recently 09/08/18;using Hydrofera Blue on both her wound areas. Wounds seem to of contracted nicely. She uses Kerlix Coban wrap and changes at home herself 09/22/2018; using Hydrofera Blue on both her wound areas. Dimensions seem to have come down somewhat. There is certainly less depth in the medial part of the mid tibia wound and I do not think there is any exposed muscle at this point. 10/06/2018; 2-week follow-up. Using [facility] on her wound areas. Dimensions have come down nicely both on the right lateral ankle area in the right mid tibial area. She has no new complaints 10/20/2018; 2-week follow-up. She is using Hydrofera Blue. Not much change from the last time she was here. The area on the lateral ankle has less depth although it has raised edges on one side. I attempted to remove as much of the raised edge as I could without creating more additional wounds. The area on the right anterior mid tibia area looks the same. 11/03/2018; 2-week follow-up she is using Hydrofera Blue. On the right anterior leg she now has 2 wounds separated by a large area of normal skin. The area on the medial part still has I think exposed muscle although this area itself is a lot smaller. The area laterally has some depth. Both areas with necrotic debris. The area on her right lateral ankle has come in nicely 11/17/2018; patient continues to use Hydrofera Blue. We have been increasing separation of the 2 wounds anteriorly which were at one point joint the area on the right lateral calf continues to have I think some improvement in depth. 12/08/2018; patient continues to use Hydrofera Blue. There is some  improvement in the area on the right lateral calf. The 2 areas that were initially part of the original large wound in the mid right tibia are probably about the same. In fact the medial area is probably somewhat larger. We will run puraply through the patient's insurance 12/22/2018; she has been using Hydrofera  Blue. We have small wounds on the right lateral calf and 2 small areas that were initially part of a large wound in the right mid tibia. We applied pure apply #1 today. 12/29/2018; we applied puraply #2. Her wounds look somewhat better especially on the right lateral calf and the lateral part of her original wound in the mid tibial area. 01/05/2019; perhaps slightly improved in terms of wound bed condition but certainly not as much improvement as I might of liked. Puraply #3 1/13: we did not have a correct sized puraply to apply. wounds more pinched out looking, I increased her compression to 3 layer last week to help with significant multilevel venous hypertension. Since then I've reviewed her arterial status. She has a right femoral endarterectomy and a distal left SFA stent. She was being followed by Dr. [name] for a period however she does not appear to have seen him in 3 years. I will set up an appointment. 1/20. The patient has an additional wound on the right lateral calf between the distal wound and proximal wounds. We did not have Puraply last week. Still does not have a follow-up with Dr. early 1/27: Follow-up with Dr. early has been arranged apparently with follow-up noninvasive studies. Wounds are measuring roughly the same although they certainly look smaller 2/3; the patient had non invasive studies. Her ABIs on the right were 0.83 and on the left 1.02 however there was no great toe pressure bilaterally. Also worrisome monophasic waveforms at the PTA and dorsalis pedis. We are still using Puraply. We have had some improvement in all of the wounds especially the lateral part of the mid tibial area. 2/10; sees Dr. early of vein and vascular re-arterial studies next week. Puraply reapplied today. 2/17; Dr. early of vein and vascular his appointment is tomorrow. Puraply reapplied after debridement of all wounds 2/24; the patient saw Dr. early I reviewed his note. sHe noted the previous right  femoral endarterectomy with a Dacron patch. He also noted the normal ABI and the monophasic waveforms suggesting tibial disease. Overall he did not feel that she had any evidence of arterial insufficiency that would impair her wound healing. He did note her venous disease as well. He suggested PRN follow-up. 3/2; I had the last puraply applied today. The original wounds over the mid tibia area are improved where is the area on the right lower leg is not 3/9; wounds are smaller especially in the right mid tibia perhaps slightly in the right lateral calf. We finished with puraply and went to endoform today 3/23; the patient arrives after 2 weeks. She has been using endoform. I think all of her wounds look slightly better which includes the area on the right lateral calf just above the right lateral malleolus and the 2 in the right mid tibia which were initially part of the same wound. 4/27; TELEHEALTH visit; the patient was seen for telehealth visit today with her consent in the middle of the worldwide epidemic. Since she was last here she called in for antibiotics with pain and tenderness around the area on the right medial ankle. I gave her empiric doxycycline. She states this feels better. She is  using endoform on both of these areas 5/11 TELEHEALTH; the patient was seen for telehealth visit today. She was accompanied at home by her husband. She has severe pulmonary hypertension accompanied scleroderma and in the face of the Covid epidemic cannot be safely brought into our clinic. We have been using endoform on her wound areas. There are essentially 3 wound areas now in the left mid tibia now 2 open areas that it one point were connected and one on the right lateral ankle just above the malleolus. 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 She cannot come to clinic. The wounds were reviewed today via telehealth. She has severe chronic venous hypertension which I think is centrally mediated. She has wounds on her right anterior tibia and right lateral ankle area. These are chronic. She has been using endoform. 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 We have been following her from telehealth. She has severe chronic venous hypertension which may be mostly centrally mediated secondary to right heart heart failure. She has wounds on her right anterior tibia and right lateral ankle these are chronic we have been using endoform 6/22; TELEHEALTH; this patient was seen today via telehealth. 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 Since the last time we had contact with this patient she developed some pain and erythema around the wound on her right lateral malleolus/ankle and we put in antibiotics for her. This is resulted in good improvement with resolution of the erythema and tenderness. I changed her to silver alginate last time. We had been using endoform for an extended period of time 7/13; TELEHEALTH; this patient was seen today via telehealth. 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 She would continue to be at a prohibitive risk to be brought into our clinic unless this was absolutely necessary. These visits have been done with her approval as well as her husband. We have been using silver alginate to the areas on the right mid tibia and right lateral lower leg. 7/27 TELEHEALTH; patient was seen along with her husband today via telehealth. 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 We changed her to sample last visit. She has 2 areas a chronic wound on her right mid tibia and one just above her ankle. These were not the original wounds when she came into this clinic but she developed them during treatment 8/17; she comes in for her first face-to-face visit in a long period. She has a remaining area just medial to the right tibia which is the last open part of her large wound across this area. She also has an area on the right lateral lower leg. We prescribed Santyl last telehealth visit but they were concerned that this was making a deeper so they put silver alginate on it last week. Her husband changes the dressings. 8/31; using Santyl to the 2 wound areas some improvement in wound surfaces. Husband has surgery in 2 weeks we will put her out 3 weeks. Any of the advanced treatment options that I  can think of that would be eligible for this wound would also cause her to have to come in weekly. The risk that the patient is just too high 9/21. Using Santyl to the 2 wound areas. Both of these are somewhat better although the medial mid tibia area still has exposed muscle. Lateral ankle requiring debridement. Using Santyl 10/12; using Santyl to the 2 wound areas. One on the right lateral ankle and the other in the medial calf which still has exposed muscle. Both areas have come down in size and have better looking surfaces. She has made nice progress with santyl 11/2; we have been using Santyl to the 2 wound areas. Right lateral ankle and the other in the mid tibia area the medial part of this still has exposed muscle. 11/23/2019 on evaluation today patient appears to be doing about the same really with regard to her wounds. She is actually not very pleased with how things seem to be progressing at this point she tells me that she really has not noted much improvement unfortunately. With that being said there is no signs of active infection at this time. There is some  slough buildup noted at this time which again along with some dry skin around the edges of the wound I think would benefit her to try to debride some of this away. Fortunately her pain is doing fairly well. She still has exposed muscle in the right medial/tibial area. 12/14; TELEHEALTH; she was changed to [facility] to the right calf wound and right lateral ankle wound when she was here last time. Unfortunately since then she had a fall with a pelvic fracture and a fracture of her wrist. She was apparently hospitalized for 5 days. I have not looked at her discharge summary. She apparently came out of the hospital with a blister on her right heel. She was seen today via telehealth by myself and our case manager. The patient and her husband were present. She has been using Hydrofera Blue at our direction from the last time she was in the clinic. There is been no major improvement in fact the areas appear deeper and with a less viable surface 01/04/2020; TELEHEALTH; the patient was seen today in accompaniment of her husband and our nurse. She has 3 open areas 1 on the right medial mid tibia, one on the right lateral ankle and a large eschared pressure area on the right heel. We have been using Iodoflex to the 2 original wounds. 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 It is simply too perilous for her to be seen in any other way 1/26; TELEHEALTH; the patient was seen today in accompaniment of her husband and our RN one of our nurses. She still has the 3 open areas 1 on the right medial mid tibia which is the remanent of a more extensive wound in this area, one on the right lateral ankle and a large eschared pressure area on the right heel. We have been using Iodoflex to the 2 original wounds and a bed at 9 application to the eschared area on the heel 2/15; the first time we have seen this patient and then several months out of concern for the pandemic. She had a large  horizontal wound in the mid tibia. Only the medial aspect of this is still open. Area on the lateral ankle is just about closed. She had a new pressure ulcer on the right heel which I have removed some of the eschar.  We have been using Iodoflex which I will continue. The area in the mid tibia has a round circle in the middle of exposed muscle. I think we would have to use an advanced treatment product to stimulate granulation over this area. We will run this through her insurance. She is not eligible for plastic surgery for many reasons 3/1; TELEHEALTH. The patient was seen today by telehealth. 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 We have been using Iodoflex to the wound areas which include the right anterior mid tibia, right lateral ankle and the right heel. All of these were reviewed 3/8; the patient's wound just above the right ankle is closed. She still has a contracting black eschar on the heel where she had a pressure ulcer. The medial part of her original wound on the mid tibia has exposed muscle. We have made really made no progress in this area although we have managed to get a lot of the original wound in this area to close. We used Apligraf #1 today 3/22; the patient's ankle wound remains closed. She still has a contracting black eschar on the heel although it seems to have less surface area. It still not clear whether there is any depth here. We used Apligraf #2 today in an attempt to get granulation over the exposed muscle and what is left of her mid tibia area. 4/5; everything is closed except the medial aspect of the mid tibia wound, as well as the pressure injury on the right heel. She has been using Betadine to the right heel which has been gradually contracting. We used Apligraf #3 today. 4/19; Apligraf #4. Still a pressure area on the right heel she has been using Betadine  superficial excoriated areas she has been applying salicylic acid to on the anterior leg and the thick horn on the leg just above her wound area 5/3; Apligraf #5. Unfortunately she came in today with a reopening of the area on the right lateral lower leg. Not much change in the wound we have been treating in the mid calf. Quite a bit of edema surrounding this wound. She reports that she was up on her feet quite a bit. The area on the right heel is separating she has been using Betadine She comes in with a new wound on the left lateral malleolus which is very disappointing this is been open since last week 5/17; we applied her last Apligraf last time. Unfortunately that did not have any effect on the deep area medially in the mid tibial area. However the area over the right lateral malleolus is a lot better. Right heel also is contracted we used Iodoflex last time. The new wound on the left lateral malleolus were using Santyl to. This required debridement. 6/1. Not much change in the area on the right medial mid tibia. Right lateral ankle is improved she has the necrotic area on the right heel which was a pressure area. New area on the left lateral malleolus from last time. I changed her to Iodoflex to the area on the left lateral malleolus she said this hurt I have put her back on Santyl 6/15; right mid tibia is unchanged. I do not think there is anything I can do to this topically that we will get this to granulate over the muscle. And I am furthermore I am not sure that she is a surgical candidate either because of her severe  pulmonary issues or because she really does not want to go through it. The area on the right lateral malleolus is a small punched out area. The area on the left lateral malleolus again small punched out with nonviable surface Pressure ulcer on the right heel at separating black eschar. I went ahead and remove this today she has an area on the left anterior mid tibia just  laterally. Geographic wounds debris on the surface. Some erythema here. 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 There is skin changes widely Change the primary dressing to Sorbact to all wound areas. She needs compression on the left leg as well as the right 6/21; right mid tibia which was her one remaining wound at 1 point is unchanged The area on the right lateral malleolus actually is close to closing over still a small punched out area The area on the left lateral malleolus again a deep wound it is this 1 that she feels pain in. Pressure ulcer on the right heel was cultured today. New area on the left anterior mid tibia several geographic wounds last week in the setting of chronic venous insufficiency this actually looks some better 7/6; Right mid tibia about the same. Exposed muscle Left lateral malleolus again necrotic debris over the surface. Pressure ulcer on the right heel. She finished the Keflex we prescribed. Necrotic debris debrided with a #3 curette The rest of her wounds on the right mid tibia right lateral ankle are better Her husband reminds me that she had a right femoral endarterectomy and has 2 stents in her left thigh placed in 2011 approximately by vascular surgery in [place]. She saw Dr. [name] last in February 2020. At that point he did not think that the arterial insufficiency she had on the right was sufficient to explain any of her right lower extremity wounds however she now has an area on the left lateral malleolus that looks like an ischemic wound 7/12; we have been using Sorbact all wound areas Right mid tibia about the same exposed muscle Left lateral malleolus small wound with debris in the surface punched out Pressure ulcer of the right heel requiring debridement of necrotic debris Lateral ankle wound on the right is just about closed Right mid tibia wound about the same still with exposed muscle. Electronic Signature(s) Signed:  07/12/2020 8:12:39 AM By: Linton Ham MD Entered By: Linton Ham on 07/11/2020 13:54:56 -------------------------------------------------------------------------------- Physical Exam Details Patient Name: Date of Service: [name] [name] [name] F. 07/11/2020 12:45 PM Medical Record Number: [id number] Patient Account Number: [id number] Date of Birth/Sex: Treating RN: May 23, 1951 (69 y.o. [name] [name] Primary Care Provider: Alton [name], [place] RY Other Clinician: Referring Provider: Treating Provider/Extender: [name] Drafts, MA RY Weeks in Treatment: 149 Constitutional Patient is hypertensive.. Pulse regular and within target range for patient.Marland Kitchen Respirations regular, non-labored and within target range.. Temperature is normal and within the target range for the patient.Marland Kitchen Appears in no distress. Notes Wound exam Midportion of the right tibia about the same still exposed muscle Right lateral ankle may be of pinpoint that still open I thought this was closed last time Right heel still necrotic debris which I am removing with a #5 curette better looking surface Left lateral ankle punched-out wound which is painful. No debridement Electronic Signature(s) Signed: 07/12/2020 8:12:39 AM By: Linton Ham MD Entered By: Linton Ham on 07/11/2020 13:56:04 -------------------------------------------------------------------------------- Physician Orders Details Patient Name: Date of Service: [name] [name], [name] 07/11/2020  12:45 PM Medical Record Number: [id number] Patient Account Number: [id number] Date of Birth/Sex: Treating RN: 04-Aug-1951 (69 y.o. 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 - MD visit Return A Nurse Visit: - 1 week for rewrap Dressing Change Frequency Do not change entire dressing for one week. Skin Barriers/Peri-Wound Care Moisturizing lotion - both legs Wound Cleansing May shower with protection. Primary Wound Dressing Wound #16 Right Calcaneus Cutimed Sorbact Wound #19 Right,Lateral Malleolus Cutimed Sorbact Wound #20 Left,Lateral Malleolus Cutimed Sorbact Wound #21 Left,Lateral Lower Leg Cutimed Sorbact Wound #5 Right,Medial Lower Leg Cutimed Sorbact Secondary Dressing Wound #16 Right Calcaneus Dry Gauze Wound #19 Right,Lateral Malleolus Dry Gauze Wound #20 Left,Lateral Malleolus Dry Gauze Wound #21 Left,Lateral Lower Leg Dry Gauze Wound #5 Right,Medial Lower Leg Dry Gauze Edema Control 3 Layer Compression System - Bilateral Avoid standing for long periods of time Elevate legs to the level of the heart or above for 30 minutes daily and/or when sitting, a frequency of: - throughout the day Off-Loading Turn and reposition every 2 hours Other: - float heels off of bed/chair with pillow under calves Electronic Signature(s) Signed: 07/11/2020 5:04:17 PM By: Levan Hurst RN, BSN Signed: 07/12/2020 8:12:39 AM By: Linton Ham MD Entered By: Levan Hurst on 07/11/2020 13:52:19 -------------------------------------------------------------------------------- Problem List Details Patient  Name: Date of Service: [name] [name] [name] F. 07/11/2020 12:45 PM Medical Record Number: [id number] Patient Account Number: [id number] Date of Birth/Sex: Treating RN: January 12, 1951 (69 y.o. 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 02/24/2015 No Yes I87.331 Chronic venous hypertension (idiopathic) with ulcer and inflammation of right 10/04/2016 No Yes lower extremity L89.616 Pressure-induced deep tissue damage of right heel 12/14/2019 No Yes L97.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 05/31/2020 No Yes L97.828 Non-pressure chronic ulcer of other part of left lower leg with other specified 06/14/2020 No Yes severity Inactive Problems ICD-10 Code Description Active Date Inactive Date 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 07/11/2020 12:45 PM Medical Record Number: [id number] Patient Account  Number: [id number] Date of Birth/Sex: Treating RN: Jun 11, 1951 (69 y.o. [name] [name] Primary Care Provider:  Alton [name], [place] RY Other Clinician: Referring Provider: Treating Provider/Extender: [name] Drafts, MA RY Weeks in Treatment: 433 Subjective History of Present Illness (HPI) this is a patient who initially came to [place] for wounds on the medial malleoli bilaterally as well as her upper medial lower extremities bilaterally. These wounds eventually healed with assistance of Apligraf's bilaterally. While this was occurring she developed the current wound which opened into a fairly substantial wound on the right lower extremity. These are mostly secondary to venous stasis physiology however the patient also has underlying scleroderma, pulmonary hypertension. The wound has been making good progress lately with the Hydrofera Blue-based dressing. 03/17/2015; patient had a arterial evaluation a year ago. Her right ABI was 0.86 left was 1.0. T brachial index was 0.41 on the right 0.45 on the left. Her oe bilateral common femoral artery waveforms were triphasic. Her white popliteal posterior tibial artery and anterior tibial artery waveforms were monophasic with good amplitude. Luteal artery waveforms were biphasic it was felt that her bilateral great toe pressures are of normal although adequate for tissue healing. 03/24/2015. The condition of this wound is not really improved that. He was covered with as fibrinous surface slough and eschar. This underwent an aggressive debridement with both a curette and scalpel. I still cannot really get down to what I can would consider to be a viable surface. There is no evidence of infection. Previous workup for ischemia roughly a year ago was negative nevertheless I think that continues to be a concern 04/07/15. The patient arrived for application of second Apligraf. Once again the surface of this wound is certainly less viable than I would like for an advanced treatment option. An aggressive debridement was done. She developed some arteriolar bleeding which required  that along pressure and silver alginate. 04/21/15: change in the condition of this wound. Once again it is covered in a gelatinous surface slough. After debridement today surface of the wound looks somewhat better but now a heeling surface 05/05/15 Apligraf #4 applied.Still a lot of slough on this wound. 05/19/15 Apligraf #5 applied. Still a lot of slough on this wound I did not aggressively remove this 06/02/15; continued copious amounts of surface slough. This debridement fairly easily. 06/08/2015 -- the last time she had a venous duplex study done was over 3 years ago and the surgery was prior to that. I have recommended that she sees Dr. early for a another opinion regarding a repeat venous duplex and possibly more endovenous ablation of vein stripping of micro-phlebotomies. 06/16/15; wound has a gelatinous surface eschar that the debridement fairly easily to a point. I don't disagree with the venous workup and perhaps even arterial re-evaluation. She is on prednisone 5 mg and continue his medications for her pulmonary artery hypertension I am not sure if the latter have any wound care healing issues I would need to investigate this. 06/23/15 continues with a gelatinous surface eschar with of fibrinous underlying. What I can see of this wound does not look unhealthy however I just can't get this material which I think is 2 different layers off. Empiric culture done 06/30/15; unfortunately not a lot of change in this wound. A gelatinous surface eschar is easily removed however it has a tight fibrinous surface underneath the. 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 The wound comes back and basically and unchanged state the. She  has a gelatinous surface that is more easily removed however there is a tightly fibrinous surface underneath the. There is no evidence of infection. She has a vascular follow-up next month. I would have to inject her in order to do a more aggressive debridement of  this area 07/21/15 the wound is roughly in the same state albeit the debridement is done with greater ease. There is less of the fibrinous eschar underneath the. There is no evidence of infection. She has follow-up with vascular surgery next week. No evidence of surrounding infection. Her original distal wounds healed while this one formed. 07/28/15; wound is easier to debride. No wound erythema. She is seeing Dr. [name] next week. 08/18/15 Has been to [place] for repeat ablation. Have been using medihoney pad with some improvement 08/25/15; absolutely no change in the condition of this wound in either its overall size or surface condition in many months now. At one point I had this down to [place] healthier surface I think with [facility] however this did not actually progress towards closure. Do not believe that the wound has actually deteriorated in terms of volume at all. We have been using a medihoney pad which allows easier debridement of the gelatinous eschar but again no overall actual improvement. the patient is going towards an ablation with pain and vascular which I think is scheduled for next week. The only other investigation that I could first see would be a biopsy. She does have underlying scleroderma 09/02/15 eschar is much easier to debridement however the base of this does not look particularly vibrant. We changed to Iodoflex. The patient had her ablation earlier this week 09/09/15 again the debridement over the base of this wound is easier and the base of the wound looks considerably better. We will continue the Iodoflex. Dr. Tawni Millers has expressed his satisfaction with the result of her ablation 09/15/15 once again the wound is relatively free of surface eschar. No debridement was done today. It has a pale-looking base to it. although this is not as deep as it once was it seems to be expanding especially inferiorly. She has had recent venous ablation but this is no closer to  healing.I've gone ahead and done a punch biopsy this from the inferior part of the wound close to normal skin 09/22/15: the wound is relatively free of surface eschar. There is some surrounding eschar. I'm not exactly sure at what level the surface is that I am seeing. 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 No evidence of atypical infection, malignancy. 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 No debridement was done 09/29/15; the wound surface is relatively free of surface eschar. There is eschar surrounding the walls of the wound. Aside from the improvement in the amount of surface slough. This wound has not progressed any towards closure. There is not even a surface that looks like there at this is ready. There is no evidence of any infection nor maligancy based on biopsy I did on 9/15. I continued with the Iodoflex however I am looking towards some alternative to try to promote some closure or filling in of this surface. Consider triple layer Oasis. Collagen did not result in adequate control of the surface slough 10/13/15; the patient was in hospital last week with severe anemia. The wound looks somewhat better after debridement although there is widening medially. There is no evidence of infection. 10/20/15; patient's wound on the right lateral lower leg is essentially unchanged. This  underwent a light surface debridement and in general the debridement is easier and the surface looks improved. I noted in doing this on the side of the wound what appears to be a piece of calcium deposition. The patient noted that she had previous things on the tips of her fingers. 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 She follows with rheumatology and I have asked them to talk to her about this. In view of that S the nonhealing ulcer may have something to do with calcinosis and  also unrelieved Raynaud's disease in this area. I should note that her original wounds on the right and left medial malleolus and the inner aspects of both legs just below the knees did however heal over 10/30/15; the patient's wound on the right lateral lower leg is essentially unchanged. I was able to remove some calcified material from the medial wound edge. 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 Otherwise the wound appears essentially unchanged there is less adherent surface eschar. Some of the calcified material was sent to pathology for analysis 11/17/15. The patient's wound on the right lateral leg is essentially unchanged. Wider Medially. 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 There is some surface slough but I did not debridement this. No evidence of surrounding infection. 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 This will make healing difficult 12/15/15; the wound has less depth surface has a fibrinous slough and calcifications in the wound edge no evidence of infection 12/22/15; the wound definitely has less Fibrinous slough on the base. Calcifications around the wound edges are still evident. Although the wound bed looks healthier it is still pale in appearance. Previous biopsy did not show malignancy 01/04/15; surgical debridement of nonviable slough and subcutaneous tissue the wound cleans up quite nicely but appears to be expanding outward calcifications around the wound edges are still there. 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 She is to see her rheumatologist I'll see if he has any opinions on this.  My punch biopsy done in September did not show calcifications although these are clearly evident. 01/19/16 light selective debridement of nonviable surface slough. There is epithelialization medially. This gives me reason for cautious optimism. She has been to see her rheumatologist, there is nothing that can be done for this type of soft tissue calcification associated with scleroderma 02/02/16 no debridement although there is a light surface slough. She has 2 peninsulas of skin 1 inferiorly and one medially. We continue to make a slight and slow but definite progressive here 02/16/16; light surface debridement with more attention to the circumference of the wound bed where the fibrinous eschar is more prevalent. No calcifications detected. She seems to have done nicely with the [facility] with some epithelialization and some improvement in the overall wound volume. 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 Continued attention to the circumference of the wound where the fibrinous eschar in calcinosis or prevalent. No calcifications were detected. She has continued improvement with Hydrofera Blue. The wound is no  longer as deep 03/15/16 surgical debridement done to remove surface escha Especially around the circumference of the wound where there is nonviable subcutaneous tissue. In spite of this there is considerable improvement in the overall dimensions and depth of the wound. [place] of epithelialization are seen especially medially inferiorly and superiorly to a lesser extent. She is using Hydrofera Blue at home 03/29/16; surgical debridement done to remove surface eschar and nonviable subcutaneous tissue. This cleans up quite nicely mention slightly larger in terms of length and width however depth is less 04/12/16; continued gradual improvement in terms of depth and the  condition of the wound base. Debridement is done. Continuing long standing Hydrofera Blue at home with Kerlix Coban wraps 04/26/16; continued gradual improvement in terms of depth and management as well as condition of the wound base. Surgical debridement done she continues with Hydrofera Blue. 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 She initially came to this clinic venous insufficiency ulcers which have long since healed 05/17/16 continued improvement in terms of the depth and measurements of this wound although she has a tightly adherent fibrinous slough each time. We've been continued with long standing Hydrofera Blue which seems to done as well for this wound is many advanced treatment options. 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 She also has chronic venous insufficiency. She has an irritation on her lateral right ankle secondary to our wraps 05/10/16; wound appears to be smaller especially on the medial aspect and especially in the width. Wound was debridement surface looks better. She is also been in the hospital apparently with anemia again she tells me she had an endoscopy. Since she got home after 3 days which I believe was sometime last week she has had an irritated painful area on the right lateral ankle surrounding the lateral malleolus 05/31/16; much more adherent surface slough today then recently although I don't think the dimensions of changed that much. A more aggressive debridement is required. The irritated area over her medial malleolus is more pruritic and painful and I don't think represents cellulitis 06/14/16; no major change in her wound dimensions however there is more tightly adherent surface slough which is disappointing. As well as she appears to have a new small area medially. Furthermore an irritated uncomfortable area on the lateral aspect of her right foot just below the lateral  malleolus. 06/21/16; I'm seeing the patient back in follow-up for the new areas under her major wound on the right anterior leg. She has been using Hydrofera Blue to this area probably for several months now and although the dressings seem to be helping for quite a period of time I think things have stagnated lately. She comes in today with a relatively tight adherent surface slough and really no changes in the wound shape or dimension. The 2 small areas she had inferiorly are tiny but still open they seem improved this well. There is no uncontrolled edema and I don't think there is any evidence of cellulitis. 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 Patient has had vascular evaluations both venous and arterial. I have biopsied this area. There is no obvious infection. The worrisome thing today is that she seems to be developing areas of erythema and epithelial damage on the medial aspect of the right foot. Also to a lesser degree inferior to the  actual wound itself. Again I see no obvious changes to suggest cellulitis however as this is the only treatable option I will probably give her antibiotics. 07/13/16 no major change in the lady's large anterior right leg wound. Still covered with a very tightly adherent surface slough which is difficult to debridement. There is less erythema around this, culture last week grew pseudomonas I gave her ciprofloxacin. The area on her lateral right malleolus looks better- 08/02/16 the patient's wounds continued to decline. Her original large anterior right leg wound looks deeper. Still adherent surface slough that is difficult to debridement. She has a small area just below this and a punched-out wound just below her lateral malleolus. In the meantime she is been in hospital with apparently an upper GI bleed on Plavix and aspirin. She is now just on Plavix she  received 3 units of packed cells 08/23/16; since I last saw this 3 weeks ago, the open large area on her right leg looks about the same syrup. She has a small satellite lesion just underneath this. The area on her medial right ankle is now a deep necrotic wound. I attempted to debridement this however there is just too much pain. 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 She follows with vascular surgery and is scheduled for an angiogram in early September 09/06/16; the patient is going for an arteriogram tomorrow. Her original large wound on the right calf is about the same the satellite lesion underneath it is about the same however the area on her medial ankle is now deeper with exposed tendon. I am no longer attempting to debride these wounds 09/20/16; the patient has undergone a right femoral endarterectomy and Dacron patch angioplasty. This seems to have helped the flow in her right leg. 10/04/16; Arrived today for aggressive debridement of the wounds on her right calf the original wound the one beneath it and a difficult area over her right lateral leg just above the lateral malleolus. 10/25/16; her 3 open wounds are about the same in terms of dimensions however the surface appears a lot healthier post debridement. Using Iodoflex 10/18/16 we have been using Iodoflex to her wounds which she tolerated with some difficulty. 10/11/16; has been using Santyl for a period of time with some improvement although again very adherent surface slough would prevent any attempted healing this. She has a original wound on the left calf, the satellite underneath that and the most recent wound on the right medial ankle. She has completed revascularization by Dr. early and has had venous ablation earlier. Want to go back to Iodoflex to see if week and get a healthier surface to this wound bed failing this I think she'll need to be taken to the OR  and I am prepared to call Dr. Marla Roe to discuss this. She is obviously not a good candidate for general anesthesia however.; 11/08/16; I put her on Iodoflex last time to see if I can get the wound bed any healthier and unfortunately today still had tremendous surface slough. 11/15/16; 4 weeks' worth of Iodoflex with not much improvement. Debridement on the major wounds on her left anterior leg is easier however this does not maintain from week to week. The punched-out area on her medial right ankle 11/29/16; I attempted to change the patient last visit to [facility] however she states this burned and was very uncomfortable therefore we gave her permission to go back to  the eye out of complex which she already had at home. Also she noted a lot of pain and swelling on the lateral aspect of her leg before she traveled to [facility] for the holidays, I called her in doxycycline over the phone. This seems to have helped 12/06/16; Wounds unchanged by in large. Using Iodoflex 12/13/16; her wounds today actually looks somewhat better. The area on the right lateral lower leg has reasonably healthy-looking granulation and perhaps as actually filled and a bit. Debridement of the 2 wounds on the medial calf is easier and post debridement appears to have a healthier base. We have referred her to Dr. Migdalia Dk for consideration of operative debridement 12/20/16; we have a quick note from Dr. Merri Ray who feels that the patient needs to be referred to an academic center/plastic surgeon. This is due to the complexity of the patient's medical issues as it applies to anything in the OR. We have been using Iodoflex 12/27/16; in general the wounds on the right leg are better in terms of the difficult to remove surface slough. She has been using Iodoflex. She is approved for Apligraf which I anticipated ordering in the next week or 2 when we get a better-looking surface 01/04/16 the deep wounds  on the right leg generally look better. Both of them are debrided further surface slough. The area on the lateral right leg was not debrided. She is approved for Apligraf I think I'll probably order this either next week or the week after depending on the surface of the wounds superiorly. We have been using Iodoflex which will continue until then 01/11/16; the deep wounds on the right leg again have a surface slough that requires debridement. I've not been able to get the wound bed on either one of these wounds down to what would be acceptable for an advanced skin stab-like Apligraf. The area on the medial leg has been improving. We have been using hot Iodoflex to all wounds which seems to do the best at at least limiting the nonviable surface 01/24/17; we have continued Iodoflex and all her wound areas. Her debridement Gen. he is easier and the surface underneath this looks viable. Nevertheless these are large area wounds with exposed muscle at least on the anterior parts. We have ordered Apligraf's for 2 weeks from now. The patient will be away next week 02/07/17; the patient was close to have first Apligraf today however we did not order one. I therefore replaced her Iodoflex. She essentially has 3 large punched- out areas on her right anterior leg and right medial ankle. 02/11/17; Apligraf #1 02/25/17; Apligraf #2. In general some improvement in the right medial ankle and right anterior leg wounds. The larger wound medially perhaps some better 03/11/17; Apligraf #3. In general continued improvement in the right medial ankle and the right anterior leg wounds. 03/25/17 Apligraf #4. In general continued improvement especially on the right medial ankle and the lower 04/08/17; Apligraf #5 in general continued improvement in all wound sites. 04/22/17; post Apligraf #5 her wound beds continued to look a lot better all of them up to the surface of the surrounding skin. Had a caramel-colored slough that I did not  debrided in case there is residual Apligraf effect. The wounds are as good as I have seen these looking quite some time. 04/29/17; we applied Nelson and last week after completing her Apligraf. Wounds look as though they've contracted somewhat although they have a nonviable surface which was problematic in the  past. Apparently she has been approved for further Apligraf's. We applied Iodosorb today after debridement. 05/06/17; we're fortunate enough today to be able to apply additional Apligraf approved by her insurance. In general all of her wounds look better Apligraf #6 05/24/17; Apligraf #7 continued improvement in all wounds o3 06/10/17 Apligraf #8. Continued improvement in the surface of all wounds. Not much of an improvement in dimensions as I might a follow 06/24/17 Apligraf #9 continued general improvement although not as much change in the wound areas I might of like. She has a new open area on the right anterior lateral ankle very small and superficial. She also has a necrotic wound on the tip of her right index finger probably secondary to severe uncontrolled Raynaud's phenomenon. She is already on sildenafil and already seen her rheumatologist who gave her Keflex. 07/08/17; Apligraf #10. Generalized improvement although she has a small additional wound just medial to the major wound area. 07/22/17; after discussion we decided not to reorder any further Apligraf's although there is been considerable improvement with these it hasn't been recently. The major wound anteriorly looks better. Smaller wounds beneath this and the more recent one and laterally look about the same. The area on the right lateral lower leg looks about the same 07/29/17 this is a patient who is exceedingly complex. She has advanced scleroderma, crest syndrome including calcinosis, PAD status post revascularization, chronic venous insufficiency status post ablations. She initially presented to this clinic with wounds on  her bilateral lower legs however these closed. More recently we have been dealing with a large open area superiorly on the right anterior leg, a smaller wound underneath this and then another one on the right medial lower extremity. These improve significantly with 10 Apligraf applications. Over the last 2-3 weeks we are making good progress with Hydrofera Blue and these seem to be making progress towards closure 08/19/17; wounds continued to make good improvement with Hydrofera Blue and episodic aggressive debridements 08/26/17; still using Hydrofera Blue. Good improvements 09/09/17; using Hydrofera Blue continued improvement. Area on the lateral part of her right leg has only a small remaining open area. The small inferior satellite region is for all intents and purposes closed. Her major wound also is come in in terms of depth and has advancing epithelialization. 09/16/17; using Hydrofera Blue with continued improvement. The smaller satellite wound. We've closed out today along with a new were satellite wound medially. The area on her medial ankle is still open and her major wound is still open but making improvement. All using Hydrofera Blue. Currently 09/30/17; using Hydrofera Blue. Still a small open area on the lateral right ankle area and her original major wound seems to be making gradual and steady improvement. 10/14/17; still using Hydrofera Blue. Still too small open areas on the right lateral ankle. Her original major wound is horizontal and linear. The most problematic area paradoxically seems to be the area to the medial area wears I thought it would be the lateral. 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 10/28/17; still using Hydrofera Blue. Right lateral ankle has a very small open area superiorly on the most lateral part of the wound. Her original open wound has 2 open areas now separated by normal skin and we've redefined  this. 11/11/17; still using Hydrofera Blue area and right lateral ankle continues to have a small open area on mostly the lateral part of the wound. Her original wound has 2 small  open areas now separated by a considerable amount of normal skin 11/28/17; the patient called in slightly before Thanksgiving to report pain and erythema above the wound on the right leg. In the past this is responded well to treatment for cellulitis and I gave her over the phone doxycycline. She stated this resulted in fairly abrupt improvement. We have been using Hydrofera Blue for a prolonged period of time to the larger wound anteriorly into the remaining wound on her right right lateral ankle. The latter is just about closed with only a small linear area and the bottom of the [place]. 12/02/17; use endoform and left the dressing on since last visit. There is no tenderness and no evidence of infection. 12/16/17; patient has been using Endoform but not making much progress. The 2 punched out open areas anteriorly which were the reminiscence of her major wound appear deeper. The area on the lateral aspect of her right calf also appears deeper. Also she has a puzzling tender swelling above her wound on the right leg. This seems larger than last time. Just above her wounds there appears to be some fluctuance in this area it is not erythematous and there is no crepitus 12/30/17; patient has been using Endoform up until last week we used Hydrofera Blue. Ultrasound of the swelling above her 2 major wounds last time was negative for a fluid collection. I gave her cefaclor for the erythema and tenderness in this area which seems better. Unfortunately both punched out areas anteriorly and the area on her right medial lower leg appear deeper. In fact the lateral of the wounds anteriorly actually looks as though it has exposed tendon and/or muscle sheath. She is not systemically unwell. She is complaining of vaginitis type symptoms  presumably Candida from her antibiotics. 01/06/18; we're using santyl. she has 2 punched out areas anteriorly which were initially part of a large wound. Unfortunately medially this is now open to tendon/muscle. All the wounds have the same adherent very difficult to debride surface. 01/20/18; 2 week follow-up using Santyl. She has the 2 punched out areas anteriorly which were initially part of her large surface wound there. Medially this still has exposed muscle. All of these have the same tightly adherent necrotic surface which requires debridement. PuraPly was not accepted by the patient's insurance however her insurance I think it changed therefore we are going to run Apligraf to gain 02/03/18; the patient has been using Santyl. The wound on the right lateral ankle looks improved and the 2 areas anteriorly on the right leg looks about the same. The medial one has exposed muscle. The lateral 1 requiresdebridement. We use PuraPly today for the first week 02/10/18; PuraPly #2. The patient has 3 wounds. The area on the right lateral ankle, 2 areas anteriorly that were part of her original large wound in this area the medial one has exposed muscle. All of the wounds were lightly debrided with a number 3 curet. PuraPly #2 applied the lateral wound on the calf and the right lateral ankle look better. 02/17/18; PuraPly #3. Patient has 3 wounds. The area on the right lateral ankle in 2 areas internally that were part of her original large wound. The lateral area has exposed muscle. She arrived with some complaints of pain around the right ankle. 02/24/18; PuraPly #4; not much change in any of the 3 wound areas. Right lateral ankle, right lateral calf. Both of these required debridement with a #3 curet. She tolerates this marginally. The area on  the medial leg still has exposed muscle. Not much change in dimensions 03/03/18 PuraPly #5. The area on the medial ankle actually looks better however the 2 separate areas  that were original parts of the larger right anterior leg wound look as though they're attempting to coalesce. 03/10/18; PuraPly #6. The area on the medial ankle actually continues to look and measures smaller however the 2 separate areas that were part of the original large wound on the right anterior leg have now coalesced. There hasn't been much improvement here. The lateral area actually has underlying exposed muscle 03/17/18-she is here in follow-up evaluation for ulcerations to the right lower extremity. She is voicing no complaints or concerns. She tolerated debridement. Puraply#7 placed 03/24/18; difficult right lower extremity ulcerations. PuraPly #8 place. She is been approved for [employer]. She did very well with Apligraf today however she is apparently reached her "lifetime max" 03/31/18; marginal improvement with PuraPly although her wounds looked as good as they have in several weeks today. Used TheraSkins #1 04/14/18 TheraSkin #2 today 04/28/18 TheraSkins #3. Wound slightly improved 05/12/18; TheraSkin skin #4. Wound response has been variable. 05/27/18 TheraSkin #5. Generally improvement in all wound areas. I've also put her in 3 layer compression to help with the severe venous hypertension 06/09/18; patient is done quite well with the TheraSkins unfortunately we have no further applications. I also put her in 3 layer compression last week and that really seems to of helped. 06/16/18; we have been using silver collagen. Wounds are smaller. Still be open area to the muscle layer of her calf however even that is contracted somewhat. She tells me that at night sometimes she has pain on the right lateral calf at the site of her lower wound. Notable that I put her into 3 layer compression about 3 weeks ago. She states that she dangles her leg over the bed that makes it feel better but she does not describe claudication during the day ooShe is going to call her secondary insurance to see if they  will continue to cover advanced treatment products I have reviewed her arterial studies from 01/22/17; this showed an ABI in the right of 1 and on the left noncompressible. TBI on the right at 0.30 on the left at 0.34. It is therefore possible she has significant PAD with medial calcification falsely elevating her ABI into the normal range. I'll need to be careful about asking her about this next week it's possible the 3 layer compression is too much 06/23/18; was able to reapply TheraSkin 1 today. Edema control is good and she is not complaining of pain no claudication 07/07/18;no major change. New wound which was apparently a taper removal injury today in our clinic between her 2 wounds on the right calf TheraSkins #2 07/14/18; I think there is some improvement in the right lateral ankle and the medial part of her wound. There is still exposed muscle medially. 07/28/18; two-week follow-up. TheraSkins#3. Unfortunately no major change. She is not a candidate I don't think for skin grafting due to severe venous hypertension associated with her scleroderma and pulmonary hypertension 08/11/18 Patient is here today for her [employer] application #70 (#5 of the second set). She seems to be doing well and in the base of the wound appears to show some progress at this point. This is the last approved [employer]askin of the second set. 08/25/18; she has completed TheraSkin. There has been some improvement on the right lateral calf wound as well as the anterior leg  wounds. The open area to muscle medially on the anterior leg wound is smaller. I'm going to transition her back to [facility] under Kerlix Coban change every second day. She reports that she had some calcification removed from her right upper arm. We have had previous problems with calcifications in her wounds on her legs but that has not happened recently 09/08/18;using Hydrofera Blue on both her wound areas. Wounds seem to of contracted nicely. She uses  Kerlix Coban wrap and changes at home herself 09/22/2018; using Hydrofera Blue on both her wound areas. Dimensions seem to have come down somewhat. There is certainly less depth in the medial part of the mid tibia wound and I do not think there is any exposed muscle at this point. 10/06/2018; 2-week follow-up. Using [facility] on her wound areas. Dimensions have come down nicely both on the right lateral ankle area in the right mid tibial area. She has no new complaints 10/20/2018; 2-week follow-up. She is using Hydrofera Blue. Not much change from the last time she was here. The area on the lateral ankle has less depth although it has raised edges on one side. I attempted to remove as much of the raised edge as I could without creating more additional wounds. The area on the right anterior mid tibia area looks the same. 11/03/2018; 2-week follow-up she is using Hydrofera Blue. On the right anterior leg she now has 2 wounds separated by a large area of normal skin. The area on the medial part still has I think exposed muscle although this area itself is a lot smaller. The area laterally has some depth. Both areas with necrotic debris. ooThe area on her right lateral ankle has come in nicely 11/17/2018; patient continues to use Hydrofera Blue. We have been increasing separation of the 2 wounds anteriorly which were at one point joint the area on the right lateral calf continues to have I think some improvement in depth. 12/08/2018; patient continues to use Hydrofera Blue. There is some improvement in the area on the right lateral calf. The 2 areas that were initially part of the original large wound in the mid right tibia are probably about the same. In fact the medial area is probably somewhat larger. We will run puraply through the patient's insurance 12/22/2018; she has been using Hydrofera Blue. We have small wounds on the right lateral calf and 2 small areas that were initially part of a large  wound in the right mid tibia. We applied pure apply #1 today. 12/29/2018; we applied puraply #2. Her wounds look somewhat better especially on the right lateral calf and the lateral part of her original wound in the mid tibial area. 01/05/2019; perhaps slightly improved in terms of wound bed condition but certainly not as much improvement as I might of liked. Puraply #3 1/13: we did not have a correct sized puraply to apply. wounds more pinched out looking, I increased her compression to 3 layer last week to help with significant multilevel venous hypertension. Since then I've reviewed her arterial status. She has a right femoral endarterectomy and a distal left SFA stent. She was being followed by Dr. [name] for a period however she does not appear to have seen him in 3 years. I will set up an appointment. 1/20. The patient has an additional wound on the right lateral calf between the distal wound and proximal wounds. We did not have Puraply last week. Still does not have a follow-up with Dr. early 1/27:  Follow-up with Dr. early has been arranged apparently with follow-up noninvasive studies. Wounds are measuring roughly the same although they certainly look smaller 2/3; the patient had non invasive studies. Her ABIs on the right were 0.83 and on the left 1.02 however there was no great toe pressure bilaterally. Also worrisome monophasic waveforms at the PTA and dorsalis pedis. We are still using Puraply. We have had some improvement in all of the wounds especially the lateral part of the mid tibial area. 2/10; sees Dr. early of vein and vascular re-arterial studies next week. Puraply reapplied today. 2/17; Dr. early of vein and vascular his appointment is tomorrow. Puraply reapplied after debridement of all wounds 2/24; the patient saw Dr. early I reviewed his note. sHe noted the previous right femoral endarterectomy with a Dacron patch. He also noted the normal ABI and the monophasic waveforms  suggesting tibial disease. Overall he did not feel that she had any evidence of arterial insufficiency that would impair her wound healing. He did note her venous disease as well. He suggested PRN follow-up. 3/2; I had the last puraply applied today. The original wounds over the mid tibia area are improved where is the area on the right lower leg is not 3/9; wounds are smaller especially in the right mid tibia perhaps slightly in the right lateral calf. We finished with puraply and went to endoform today 3/23; the patient arrives after 2 weeks. She has been using endoform. I think all of her wounds look slightly better which includes the area on the right lateral calf just above the right lateral malleolus and the 2 in the right mid tibia which were initially part of the same wound. 4/27; TELEHEALTH visit; the patient was seen for telehealth visit today with her consent in the middle of the worldwide epidemic. Since she was last here she called in for antibiotics with pain and tenderness around the area on the right medial ankle. I gave her empiric doxycycline. She states this feels better. She is using endoform on both of these areas 5/11 TELEHEALTH; the patient was seen for telehealth visit today. She was accompanied at home by her husband. She has severe pulmonary hypertension accompanied scleroderma and in the face of the Covid epidemic cannot be safely brought into our clinic. We have been using endoform on her wound areas. There are essentially 3 wound areas now in the left mid tibia now 2 open areas that it one point were connected and one on the right lateral ankle just above the malleolus. 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 She cannot come to clinic. The wounds were reviewed today via telehealth. She has severe  chronic venous hypertension which I think is centrally mediated. She has wounds on her right anterior tibia and right lateral ankle area. These are chronic. She has been using endoform. 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 We have been following her from telehealth. She has severe chronic venous hypertension which may be mostly centrally mediated secondary to right heart heart failure. She has wounds on her right anterior tibia and right lateral ankle these are chronic we have been using endoform 6/22; TELEHEALTH; this patient was seen today via telehealth. 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 Since the last time we had contact with this patient she developed some pain and erythema around the wound on her right lateral malleolus/ankle and we put in antibiotics for her. This is resulted in good improvement with resolution of the erythema and tenderness. I changed her to silver alginate last time. We had been using endoform for an extended period of time 7/13; TELEHEALTH; this patient was seen today via telehealth. 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 She would continue to be at a prohibitive risk to be brought into our clinic unless this was absolutely necessary. These visits have been done with her approval as well as her husband. We have been using silver alginate to the areas on the right mid tibia and right lateral lower leg. 7/27 TELEHEALTH; patient was seen along with her husband today via telehealth. 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 We changed her to sample last visit. She has 2 areas a chronic wound on her right mid tibia  and one just above her ankle. These were not the original wounds when she came into this clinic but she developed them during treatment 8/17; she comes in for her first face-to-face visit in a long period. She has a remaining area just medial to the right tibia which is the last open part of her large wound across this area. She also has an area on the right lateral lower leg. We prescribed Santyl last telehealth visit but they were concerned that this was making a deeper so they put silver alginate on it last week. Her husband changes the dressings. 8/31; using Santyl to the 2 wound areas some improvement in wound surfaces. Husband has surgery in 2 weeks we will put her out 3 weeks. Any of the advanced treatment options that I can think of that would be eligible for this wound would also cause her to have to come in weekly. The risk that the patient is just too high 9/21. Using Santyl to the 2 wound areas. Both of these are somewhat better although the medial mid tibia area still has exposed muscle. Lateral ankle requiring debridement. Using Santyl 10/12; using Santyl to the 2 wound areas. One on the right lateral ankle and the other in the medial calf which still has exposed muscle. Both areas have come down in size and have better looking surfaces. She has made nice progress with santyl 11/2; we have been using Santyl to the 2 wound areas. Right lateral ankle and the other in the mid tibia area the medial part of this still has exposed muscle. 11/23/2019 on evaluation today patient appears to be doing about the same really with regard to her wounds. She is actually not very pleased with how things seem to be progressing at this point she tells me that she really has not noted much improvement unfortunately. With that being said there is no signs of active infection at this time. There is some slough buildup noted at this time which again along with some dry skin around the edges of the wound I  think would benefit her to try to debride some of this away. Fortunately her pain is doing fairly well. She still has exposed muscle in the right medial/tibial area. 12/14; TELEHEALTH; she was changed to [facility]  Blue to the right calf wound and right lateral ankle wound when she was here last time. Unfortunately since then she had a fall with a pelvic fracture and a fracture of her wrist. She was apparently hospitalized for 5 days. I have not looked at her discharge summary. She apparently came out of the hospital with a blister on her right heel. She was seen today via telehealth by myself and our case manager. The patient and her husband were present. She has been using Hydrofera Blue at our direction from the last time she was in the clinic. There is been no major improvement in fact the areas appear deeper and with a less viable surface 01/04/2020; TELEHEALTH; the patient was seen today in accompaniment of her husband and our nurse. She has 3 open areas 1 on the right medial mid tibia, one on the right lateral ankle and a large eschared pressure area on the right heel. We have been using Iodoflex to the 2 original wounds. 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 It is simply too perilous for her to be seen in any other way 1/26; TELEHEALTH; the patient was seen today in accompaniment of her husband and our RN one of our nurses. She still has the 3 open areas 1 on the right medial mid tibia which is the remanent of a more extensive wound in this area, one on the right lateral ankle and a large eschared pressure area on the right heel. We have been using Iodoflex to the 2 original wounds and a bed at 9 application to the eschared area on the heel 2/15; the first time we have seen this patient and then several months out of concern for the pandemic. She had a large horizontal wound in the mid tibia. Only the medial aspect of this is still open. Area on the lateral  ankle is just about closed. She had a new pressure ulcer on the right heel which I have removed some of the eschar. We have been using Iodoflex which I will continue. The area in the mid tibia has a round circle in the middle of exposed muscle. I think we would have to use an advanced treatment product to stimulate granulation over this area. We will run this through her insurance. She is not eligible for plastic surgery for many reasons 3/1; TELEHEALTH. The patient was seen today by telehealth. 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 We have been using Iodoflex to the wound areas which include the right anterior mid tibia, right lateral ankle and the right heel. All of these were reviewed 3/8; the patient's wound just above the right ankle is closed. She still has a contracting black eschar on the heel where she had a pressure ulcer. The medial part of her original wound on the mid tibia has exposed muscle. We have made really made no progress in this area although we have managed to get a lot of the original wound in this area to close. We used Apligraf #1 today 3/22; the patient's ankle wound remains closed. She still has a contracting black eschar on the heel although it seems to have less surface area. It still not clear whether there is any depth here. We used Apligraf #2 today in an attempt to get granulation over the exposed muscle and what is left of her mid tibia area. 4/5; everything is closed except the  medial aspect of the mid tibia wound, as well as the pressure injury on the right heel. She has been using Betadine to the right heel which has been gradually contracting. We used Apligraf #3 today. 4/19; Apligraf #4. Still a pressure area on the right heel she has been using Betadine superficial excoriated areas she has been applying salicylic acid to on the anterior leg and the thick horn  on the leg just above her wound area 5/3; Apligraf #5. Unfortunately she came in today with a reopening of the area on the right lateral lower leg. Not much change in the wound we have been treating in the mid calf. Quite a bit of edema surrounding this wound. She reports that she was up on her feet quite a bit. The area on the right heel is separating she has been using Betadine She comes in with a new wound on the left lateral malleolus which is very disappointing this is been open since last week 5/17; we applied her last Apligraf last time. Unfortunately that did not have any effect on the deep area medially in the mid tibial area. However the area over the right lateral malleolus is a lot better. Right heel also is contracted we used Iodoflex last time. The new wound on the left lateral malleolus were using Santyl to. This required debridement. 6/1. Not much change in the area on the right medial mid tibia. Right lateral ankle is improved she has the necrotic area on the right heel which was a pressure area. New area on the left lateral malleolus from last time. I changed her to Iodoflex to the area on the left lateral malleolus she said this hurt I have put her back on Santyl 6/15; right mid tibia is unchanged. I do not think there is anything I can do to this topically that we will get this to granulate over the muscle. And I am furthermore I am not sure that she is a surgical candidate either because of her severe pulmonary issues or because she really does not want to go through it. ooThe area on the right lateral malleolus is a small punched out area. ooThe area on the left lateral malleolus again small punched out with nonviable surface ooPressure ulcer on the right heel at separating black eschar. I went ahead and remove this today Lura Em has an area on the left anterior mid tibia just laterally. Geographic wounds debris on the surface. Some erythema here. 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 There is skin changes widely Change the primary dressing to Sorbact to all wound areas. She needs compression on the left leg as well as the right 6/21; right mid tibia which was her one remaining wound at 1 point is unchanged ooThe area on the right lateral malleolus actually is close to closing over still a small punched out area ooThe area on the left lateral malleolus again a deep wound it is this 1 that she feels pain in. ooPressure ulcer on the right heel was cultured today. ooNew area on the left anterior mid tibia several geographic wounds last week in the setting of chronic venous insufficiency this actually looks some better 7/6; ooRight mid tibia about the same. Exposed muscle ooLeft lateral malleolus again necrotic debris over the surface. ooPressure ulcer on the right heel. She finished the Keflex we prescribed. Necrotic debris debrided with a #3 curette ooThe rest of her wounds on the right mid tibia right lateral ankle are better  Her husband reminds me that she had a right femoral endarterectomy and has 2 stents in her left thigh placed in 2011 approximately by vascular surgery in [place]. She saw Dr. [name] last in February 2020. At that point he did not think that the arterial insufficiency she had on the right was sufficient to explain any of her right lower extremity wounds however she now has an area on the left lateral malleolus that looks like an ischemic wound 7/12; we have been using Sorbact all wound areas ooRight mid tibia about the same exposed muscle ooLeft lateral malleolus small wound with debris in the surface punched out ooPressure ulcer of the right heel requiring debridement of necrotic debris ooLateral ankle wound on the right is just about closed ooRight mid tibia wound about the same still with exposed muscle. Objective Constitutional Patient is hypertensive.. Pulse regular and within target range for  patient.Marland Kitchen Respirations regular, non-labored and within target range.. Temperature is normal and within the target range for the patient.Marland Kitchen Appears in no distress. Vitals Time Taken: 1:10 PM, Height: 68 in, Weight: 132 lbs, BMI: 20.1, Temperature: 98.4 F, Pulse: 77 bpm, Respiratory Rate: 19 breaths/min, Blood Pressure: 171/62 mmHg. General Notes: Wound exam ooMidportion of the right tibia about the same still exposed muscle ooRight lateral ankle may be of pinpoint that still open I thought this was closed last time ooRight heel still necrotic debris which I am removing with a #5 curette better looking surface ooLeft lateral ankle punched-out wound which is painful. No debridement Integumentary (Hair, Skin) Wound #16 status is Open. Original cause of wound was Pressure Injury. The wound is located on the Right Calcaneus. The wound measures 0.8cm length x 1.6cm width x 0.8cm depth; 1.005cm^2 area and 0.804cm^3 volume. There is Fat Layer (Subcutaneous Tissue) Exposed exposed. There is no tunneling or undermining noted. There is a small amount of purulent drainage noted. The wound margin is well defined and not attached to the wound base. There is no granulation within the wound bed. There is a large (67-100%) amount of necrotic tissue within the wound bed including Adherent Slough. Wound #19 status is Open. Original cause of wound was Gradually Appeared. The wound is located on the Right,Lateral Malleolus. The wound measures 0.3cm length x 0.1cm width x 0.1cm depth; 0.024cm^2 area and 0.002cm^3 volume. There is Fat Layer (Subcutaneous Tissue) Exposed exposed. There is no tunneling or undermining noted. There is a small amount of serous drainage noted. The wound margin is flat and intact. There is large (67-100%) pink granulation within the wound bed. There is no necrotic tissue within the wound bed. Wound #20 status is Open. Original cause of wound was Gradually Appeared. The wound is located on the  Left,Lateral Malleolus. The wound measures 0.7cm length x 0.7cm width x 0.4cm depth; 0.385cm^2 area and 0.154cm^3 volume. There is Fat Layer (Subcutaneous Tissue) Exposed exposed. There is no tunneling or undermining noted. There is a medium amount of serosanguineous drainage noted. The wound margin is well defined and not attached to the wound base. There is no granulation within the wound bed. There is a large (67-100%) amount of necrotic tissue within the wound bed including Adherent Slough. Wound #21 status is Open. Original cause of wound was Gradually Appeared. The wound is located on the Left,Lateral Lower Leg. The wound measures 0.8cm length x 0.5cm width x 0.1cm depth; 0.314cm^2 area and 0.031cm^3 volume. There is Fat Layer (Subcutaneous Tissue) Exposed exposed. There is no tunneling or undermining noted. There is  a small amount of serous drainage noted. The wound margin is flat and intact. There is no granulation within the wound bed. There is a large (67-100%) amount of necrotic tissue within the wound bed including Adherent Slough. Wound #22 status is Open. Original cause of wound was Gradually Appeared. The wound is located on the Left,Distal,Lateral Lower Leg. The wound measures 0.4cm length x 0.4cm width x 0.1cm depth; 0.126cm^2 area and 0.013cm^3 volume. There is Fat Layer (Subcutaneous Tissue) Exposed exposed. There is no tunneling or undermining noted. There is a small amount of serous drainage noted. The wound margin is flat and intact. There is no granulation within the wound bed. There is a large (67-100%) amount of necrotic tissue within the wound bed including Adherent Slough. Wound #5 status is Open. Original cause of wound was Gradually Appeared. The wound is located on the Right,Medial Lower Leg. The wound measures 1.5cm length x 2.1cm width x 0.5cm depth; 2.474cm^2 area and 1.237cm^3 volume. There is Fat Layer (Subcutaneous Tissue) Exposed exposed. There is no tunneling or  undermining noted. There is a medium amount of serosanguineous drainage noted. The wound margin is distinct with the outline attached to the wound base. There is small (1-33%) pink granulation within the wound bed. There is a large (67-100%) amount of necrotic tissue within the wound bed including Adherent Slough. 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 There was a Excisional Skin/Subcutaneous Tissue Debridement with a total area of 1.28 sq cm performed by Ricard Dillon., MD. With the following instrument(s): Curette to remove Viable and Non-Viable tissue/material. Material removed includes Subcutaneous Tissue and Slough and after achieving pain control using Other (Benzocaine 20%). No specimens were taken. A time out was conducted at 13:48, prior to the start of the procedure. A Minimum amount of bleeding was controlled with Pressure. The procedure was tolerated well with a pain level of 0 throughout and a pain level of 0 following the procedure. Post Debridement Measurements: 0.8cm length x 1.6cm width x 0.8cm depth; 0.804cm^3 volume. Post debridement Stage noted as Unstageable/Unclassified. Character of Wound/Ulcer Post Debridement requires further debridement. Post procedure Diagnosis Wound #16: Same as Pre-Procedure Pre-procedure diagnosis of Wound #16  is a Pressure Ulcer located on the Right Calcaneus . There was a Three Layer Compression Therapy Procedure by Levan Hurst, RN. Post procedure Diagnosis Wound #16: Same as Pre-Procedure Wound #19 Pre-procedure diagnosis of Wound #19 is a Venous Leg Ulcer located on the Right,Lateral Malleolus . There was a Three Layer Compression Therapy Procedure by Levan Hurst, RN. Post procedure Diagnosis Wound #19: Same as Pre-Procedure Wound #20 Pre-procedure diagnosis of Wound #20 is an Arterial Insufficiency Ulcer located on the Left,Lateral Malleolus . There was a Three Layer Compression Therapy Procedure by Levan Hurst, RN. Post procedure Diagnosis Wound #20: Same as Pre-Procedure Wound #21 Pre-procedure diagnosis of Wound #21 is a Venous Leg Ulcer located on the Left,Lateral Lower Leg . There was a Three  Layer Compression Therapy Procedure by Levan Hurst, RN. Post procedure Diagnosis Wound #21: Same as Pre-Procedure Wound #22 Pre-procedure diagnosis of Wound #22 is a Venous Leg Ulcer located on the Left,Distal,Lateral Lower Leg . There was a Three Layer Compression Therapy Procedure by Levan Hurst, RN. Post procedure Diagnosis Wound #22: Same as Pre-Procedure Wound #5 Pre-procedure diagnosis of Wound #5 is a Venous Leg Ulcer located on the Right,Medial Lower Leg . There was a Three Layer Compression Therapy Procedure by Levan Hurst, RN. Post procedure Diagnosis Wound #5: Same as Pre-Procedure Plan Follow-up Appointments: Return Appointment in 2 weeks. - MD visit Nurse Visit: - 1 week for rewrap Dressing Change Frequency: Do not change entire dressing for one week. Skin Barriers/Peri-Wound Care: Moisturizing lotion - both legs Wound Cleansing: May shower with protection. Primary Wound Dressing: Wound #16 Right Calcaneus: Cutimed Sorbact Wound #19 Right,Lateral Malleolus: Cutimed Sorbact Wound #20 Left,Lateral Malleolus: Cutimed Sorbact Wound #21 Left,Lateral Lower  Leg: Cutimed Sorbact Wound #5 Right,Medial Lower Leg: Cutimed Sorbact Secondary Dressing: Wound #16 Right Calcaneus: Dry Gauze Wound #19 Right,Lateral Malleolus: Dry Gauze Wound #20 Left,Lateral Malleolus: Dry Gauze Wound #21 Left,Lateral Lower Leg: Dry Gauze Wound #5 Right,Medial Lower Leg: Dry Gauze Edema Control: 3 Layer Compression System - Bilateral Avoid standing for long periods of time Elevate legs to the level of the heart or above for 30 minutes daily and/or when sitting, a frequency of: - throughout the day Off-Loading: Turn and reposition every 2 hours Other: - float heels off of bed/chair with pillow under calves 1. I have not changed the primary dressing which is Sorbact 2. They have not heard from vein and vascular. I am specifically interested in her arterial evaluation on the left and right legs. Stents on both side Electronic Signature(s) Signed: 07/12/2020 8:12:39 AM By: Linton Ham MD Entered By: Linton Ham on 07/11/2020 13:57:53 -------------------------------------------------------------------------------- SuperBill Details Patient Name: Date of Service: [name] [name], [name] Manges. 07/11/2020 Medical Record Number: [id number] Patient Account Number: [id number] Date of Birth/Sex: Treating RN: Apr 14, 1951 (68 y.o. 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 I87.331 Chronic venous hypertension (idiopathic) with ulcer and inflammation of right lower extremity L89.616 Pressure-induced deep tissue damage of right heel L97.328  Non-pressure chronic ulcer of left ankle with other specified severity L97.828 Non-pressure chronic ulcer of other part of left lower leg with other specified severity Facility Procedures CPT4 Code: 91995790 Description: 09200 - DEB SUBQ TISSUE 20 SQ CM/< ICD-10 Diagnosis Description L89.616 Pressure-induced deep tissue damage of right heel Modifier: Quantity: 1 CPT4 Code: 41593012 Description: (Facility Use Only) 29581LT - APPLY MULTLAY COMPRS LWR LT LEG Modifier: 59 Quantity: 1 Physician Procedures : CPT4 Code Description Modifier [id number] 11042 - WC PHYS SUBQ TISS 20 SQ CM ICD-10 Diagnosis Description L89.616 Pressure-induced deep tissue damage of right heel Quantity: 1 Electronic Signature(s) Signed: 07/11/2020 5:04:17 PM By: Levan Hurst RN, BSN Signed: 07/12/2020 8:12:39 AM By: Linton Ham MD Entered By: Levan Hurst on 07/11/2020 14:35:14

## 2020-07-13 LAB — COMPLETE METABOLIC PANEL WITH GFR
AG Ratio: 1.3 (calc) (ref 1.0–2.5)
ALT: 9 U/L (ref 6–29)
AST: 18 U/L (ref 10–35)
Albumin: 3.8 g/dL (ref 3.6–5.1)
Alkaline phosphatase (APISO): 55 U/L (ref 37–153)
BUN/Creatinine Ratio: 28 (calc) — ABNORMAL HIGH (ref 6–22)
BUN: 40 mg/dL — ABNORMAL HIGH (ref 7–25)
CO2: 32 mmol/L (ref 20–32)
Calcium: 9.1 mg/dL (ref 8.6–10.4)
Chloride: 95 mmol/L — ABNORMAL LOW (ref 98–110)
Creat: 1.44 mg/dL — ABNORMAL HIGH (ref 0.60–0.93)
GFR, Est African American: 43 mL/min/{1.73_m2} — ABNORMAL LOW (ref >=60)
GFR, Est Non African American: 37 mL/min/{1.73_m2} — ABNORMAL LOW (ref >=60)
Globulin: 3 g/dL (calc) (ref 1.9–3.7)
Glucose, Bld: 81 mg/dL (ref 65–99)
Potassium: 4.2 mmol/L (ref 3.5–5.3)
Sodium: 136 mmol/L (ref 135–146)
Total Bilirubin: 0.5 mg/dL (ref 0.2–1.2)
Total Protein: 6.8 g/dL (ref 6.1–8.1)

## 2020-07-13 LAB — TEST AUTHORIZATION

## 2020-07-13 LAB — CBC WITH DIFFERENTIAL/PLATELET
Absolute Monocytes: 437 cells/uL (ref 200–950)
Basophils Absolute: 9 cells/uL (ref 0–200)
Basophils Relative: 0.2 %
Eosinophils Absolute: 0 cells/uL — ABNORMAL LOW (ref 15–500)
Eosinophils Relative: 0 %
HCT: 30.4 % — ABNORMAL LOW (ref 35.0–45.0)
Hemoglobin: 10 g/dL — ABNORMAL LOW (ref 11.7–15.5)
Lymphs Abs: 608 cells/uL — ABNORMAL LOW (ref 850–3900)
MCH: 32.9 pg (ref 27.0–33.0)
MCHC: 32.9 g/dL (ref 32.0–36.0)
MCV: 100 fL (ref 80.0–100.0)
MPV: 9.6 fL (ref 7.5–12.5)
Monocytes Relative: 9.7 %
Neutro Abs: 3447 cells/uL (ref 1500–7800)
Neutrophils Relative %: 76.6 %
Platelets: 246 10*3/uL (ref 140–400)
RBC: 3.04 10*6/uL — ABNORMAL LOW (ref 3.80–5.10)
RDW: 13.6 % (ref 11.0–15.0)
Total Lymphocyte: 13.5 %
WBC: 4.5 10*3/uL (ref 3.8–10.8)

## 2020-07-13 LAB — IRON: Iron: 95 ug/dL (ref 45–160)

## 2020-07-13 LAB — TSH: TSH: 3.27 mIU/L (ref 0.40–4.50)

## 2020-07-13 LAB — MAGNESIUM: Magnesium: 1.8 mg/dL (ref 1.5–2.5)

## 2020-07-13 NOTE — Progress Notes (Signed)
[name], [name] ([id number]) Visit Report for 07/05/2020 Arrival Information Details Patient Name: Date of Service: [name] 07/05/2020 3:30 PM Medical Record Number: [id number] Patient Account Number: [id number] Date of Birth/Sex: Treating RN: 02/28/1951 (69 y.o. [name] [name] Primary Care Haani Bakula: [name], [place] RY Other Clinician: Referring Fabricio Endsley: Treating Jeriah Skufca/Extender: [name] Drafts, MA RY Weeks in Treatment: 024 Visit Information History Since Last Visit Added or deleted any medications: No Patient Arrived: Ambulatory Any new allergies or adverse reactions: No Arrival Time: 15:58 Had a fall or experienced change in No Accompanied By: husband activities of daily living that may affect Transfer Assistance: None risk of falls: Patient Identification Verified: Yes Signs or symptoms of abuse/neglect since last visito No Secondary Verification Process Completed: Yes Hospitalized since last visit: No Patient Requires Transmission-Based Precautions: No Implantable device outside of the clinic excluding No Patient Has Alerts: No cellular tissue based products placed in the center since last visit: Has Dressing in Place as Prescribed: Yes Pain Present Now: No Electronic Signature(s) Signed: 07/08/2020 2:13:12 PM By: Sandre Kitty Entered By: Sandre Kitty on 07/05/2020 15:59:18 -------------------------------------------------------------------------------- Compression Therapy Details Patient Name: Date of Service: [name] 07/05/2020 3:30 PM Medical Record Number: [id number] Patient Account Number: [id number] Date of Birth/Sex: Treating RN: 1951/12/10 (69 y.o. [name] [name] Primary Care Faolan Springfield: [name], [place] R[place] Other Clinician: Referring Marquitta Persichetti: Treating Charvis Lightner/Extender: [name] Drafts, MA RY Weeks in Treatment: 242 Compression Therapy Performed for Wound Assessment: Wound #16 Right Calcaneus Performed By: Clinician  Levan Hurst, RN Compression Type: Three Layer Post Procedure Diagnosis Same as Pre-procedure Electronic Signature(s) Signed: 07/05/2020 5:26:11 PM By: Levan Hurst RN, BSN Entered By: Levan Hurst on 07/05/2020 16:47:57 -------------------------------------------------------------------------------- Compression Therapy Details Patient Name: Date of Service: [name] 07/05/2020 3:30 PM Medical Record Number: [id number] Patient Account Number: [id number] Date of Birth/Sex: Treating RN: 28-Jan-1951 (69 y.o. [name] [name] Primary Care Tripton Ned: [name], [place] R[place] Other Clinician: Referring Megan Presti: Treating Cece Milhouse/Extender: [name] Drafts, MA RY Weeks in Treatment: 297 Compression Therapy Performed for Wound Assessment: Wound #19 Right,Lateral Malleolus Performed By: Clinician Levan Hurst, RN Compression Type: Three Layer Post Procedure Diagnosis Same as Pre-procedure Electronic Signature(s) Signed: 07/05/2020 5:26:11 PM By: Levan Hurst RN, BSN Entered By: Levan Hurst on 07/05/2020 16:47:57 -------------------------------------------------------------------------------- Compression Therapy Details Patient Name: Date of Service: [name] 07/05/2020 3:30 PM Medical Record Number: [id number] Patient Account Number: [id number] Date of Birth/Sex: Treating RN: September 25, 1951 (69 y.o. [name] [name] Primary Care Lundynn Cohoon: [name], [place] R[place] Other Clinician: Referring Atalie Oros: Treating Angelyne Terwilliger/Extender: [name] Drafts, MA RY Weeks in Treatment: 432 Compression Therapy Performed for Wound Assessment: Wound #21 Left,Lateral Lower Leg Performed By: Clinician Levan Hurst, RN Compression Type: Three Layer Post Procedure Diagnosis Same as Pre-procedure Electronic Signature(s) Signed: 07/05/2020 5:26:11 PM By: Levan Hurst RN, BSN Entered By: Levan Hurst on 07/05/2020  16:47:57 -------------------------------------------------------------------------------- Compression Therapy Details Patient Name: Date of Service: [name] 07/05/2020 3:30 PM Medical Record Number: [id number] Patient Account Number: [id number] Date of Birth/Sex: Treating RN: May 06, 1951 (69 y.o. [name] [name] Primary Care Vincy [name]: [name], [place] R[place] Other Clinician: Referring Brently Voorhis: Treating Shakea Isip/Extender: [name] Drafts, MA RY Weeks in Treatment: 432 Compression Therapy Performed for Wound Assessment: Wound #22 Left,Distal,Lateral Lower Leg Performed By: Clinician Levan Hurst, RN Compression Type: Three Layer Post Procedure Diagnosis Same as  Pre-procedure Electronic Signature(s) Signed: 07/05/2020 5:26:11 PM By: Levan Hurst RN, BSN Signed: 07/05/2020 5:26:11 PM By: Levan Hurst RN, BSN Entered By: Levan Hurst on 07/05/2020 16:47:57 -------------------------------------------------------------------------------- Compression Therapy Details Patient Name: Date of Service: [name], [name] 07/05/2020 3:30 PM Medical Record Number: [id number] Patient Account Number: [id number] Date of Birth/Sex: Treating RN: 05-17-1951 (69 y.o. [name] [name] Primary Care Arlyss Weathersby: [name], [place] R[place] Other Clinician: Referring Dominie Benedick: Treating Dakota Stangl/Extender: [name] Drafts, MA RY Weeks in Treatment: 432 Compression Therapy Performed for Wound Assessment: Wound #5 Right,Medial Lower Leg Performed By: Clinician Levan Hurst, RN Compression Type: Three Layer Post Procedure Diagnosis Same as Pre-procedure Electronic Signature(s) Signed: 07/05/2020 5:26:11 PM By: Levan Hurst RN, BSN Entered By: Levan Hurst on 07/05/2020 16:47:58 -------------------------------------------------------------------------------- Compression Therapy Details Patient Name: Date of Service: [name] 07/05/2020 3:30 PM Medical Record Number:  [id number] Patient Account Number: [id number] Date of Birth/Sex: Treating RN: Dec 07, 1951 (69 y.o. [name] [name] Primary Care Lansing Sigmon: [name], [place] R[place] Other Clinician: Referring Jamilla Galli: Treating Birdell Frasier/Extender: [name] Drafts, MA RY Weeks in Treatment: 220 Compression Therapy Performed for Wound Assessment: Wound #20 Left,Lateral Malleolus Performed By: Clinician Levan Hurst, RN Compression Type: Three Layer Post Procedure Diagnosis Same as Pre-procedure Electronic Signature(s) Signed: 07/05/2020 5:26:11 PM By: Levan Hurst RN, BSN Entered By: Levan Hurst on 07/05/2020 16:47:58 -------------------------------------------------------------------------------- Encounter Discharge Information Details Patient Name: Date of Service: [name], [name] 07/05/2020 3:30 PM Medical Record Number: [id number] Patient Account Number: [id number] Date of Birth/Sex: Treating RN: 04/10/1951 (69 y.o. [name] Primary Care Thurmond Hildebran: [name], [place] R[place] Other Clinician: Referring Manila Rommel: Treating Addasyn Mcbreen/Extender: [name] Drafts, MA RY Weeks in Treatment: [phone] Encounter Discharge Information Items Post Procedure Vitals Discharge Condition: Stable Temperature (F): 98.3 Ambulatory Status: Ambulatory Pulse (bpm): 75 Discharge Destination: Home Respiratory Rate (breaths/min): 20 Transportation: Private Auto Blood Pressure (mmHg): 177/68 Accompanied By: self Schedule Follow-up Appointment: Yes Clinical Summary of Care: Patient Declined Electronic Signature(s) Signed: 07/05/2020 5:18:40 PM By: Kela Millin Entered By: Kela Millin on 07/05/2020 17:11:18 -------------------------------------------------------------------------------- Lower Extremity Assessment Details Patient Name: Date of Service: [name] CKYolanda Manges 07/05/2020 3:30 PM Medical Record Number: [id number] Patient Account Number: [id number] Date of Birth/Sex: Treating RN: Feb 03, 1951  (69 y.o. [name] Primary Care Lindzey Zent: [name], [place] R[place] Other Clinician: Referring Shannell Mikkelsen: Treating Javid Kemler/Extender: [name] Drafts, MA RY Weeks in Treatment: 432 Edema Assessment Assessed: [Left: No] [Right: No] Edema: [Left: Yes] [Right: Yes] Calf Left: Right: Point of Measurement: 36 cm From Medial Instep 31.4 cm 30.5 cm Ankle Left: Right: Point of Measurement: 10 cm From Medial Instep 21.1 cm 21.7 cm Vascular Assessment Pulses: Dorsalis Pedis Palpable: [Left:No] [Right:Yes] Electronic Signature(s) Signed: 07/05/2020 5:06:40 PM By: Baruch Gouty RN, BSN Entered By: Baruch Gouty on 07/05/2020 16:08:27 -------------------------------------------------------------------------------- Multi Wound Chart Details Patient Name: Date of Service: [name] 07/05/2020 3:30 PM Medical Record Number: [id number] Patient Account Number: [id number] Date of Birth/Sex: Treating RN: [birth year] (69 y.o. [name] [name] Primary Care Brecken Walth: [name], [place] R[place] Other Clinician: Referring Siddhanth Denk: Treating Bhavya Eschete/Extender: [name] Drafts, MA RY Weeks in Treatment: 948 Vital Signs Height(in): 68 Pulse(bpm): 75 Weight(lbs): 132 Blood Pressure(mmHg): 177/68 Body Mass Index(BMI): 20 Temperature(F): 98.3 Respiratory Rate(breaths/min): 20 Photos: [16:No Photos Right Calcaneus] [19:No Photos Right, Lateral Malleolus] [20:No Photos Left, Lateral Malleolus] Wound Location: [16:Pressure Injury] [19:Gradually Appeared] [20:Gradually Appeared]  Wounding Event: [16:Pressure Ulcer] [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] Primary Etiology: [16:Anemia, Hypertension, Peripheral] [19:Anemia, Hypertension, Peripheral] [20:Anemia, Hypertension, Peripheral] Comorbid History: [16:Arterial Disease, Peripheral Venous Disease, Raynauds, Scleroderma, Rheumatoid Arthritis, Osteoarthritis 01/04/2020] [19:Arterial Disease, Peripheral Venous Disease,  Raynauds, Scleroderma, Rheumatoid Arthritis, Osteoarthritis  05/02/2020] [20:Arterial Disease, Peripheral Venous Disease, Raynauds, Scleroderma, Rheumatoid Arthritis, Osteoarthritis 05/02/2020] Date Acquired: [16:26] [19:9] [20:9] Weeks of Treatment: [16:Open] [19:Open] [20:Open] Wound Status: [16:No] [19:No] [20:No] Clustered Wound: [16:0.6x1.5x0.7] [19:0.2x0.2x0.1] [20:0.8x0.8x0.3] Measurements L x W x D (cm) [16:0.707] [19:0.031] [20:0.503] A (cm) : rea [16:0.495] [19:0.003] [20:0.151] Volume (cm) : [16:94.40%] [19:78.00%] [20:-14.30%] % Reduction in A rea: [16:60.60%] [19:89.30%] [20:-71.60%] % Reduction in Volume: [16:11] Starting Position 1 (o'clock): [16:3] Ending Position 1 (o'clock): [16:0.4] Maximum Distance 1 (cm): [16:Yes] [19:No] [20:No] Undermining: [16:Unstageable/Unclassified] [19:Full Thickness Without Exposed] [20:Full Thickness Without Exposed] Classification: [16:Small] [19:Support Structures Small] [20:Support Structures Medium] Exudate Amount: [16:Purulent] [19:Serous] [20:Serosanguineous] Exudate Type: [16:yellow, brown, green] [19:amber] [20:red, brown] Exudate Color: [16:Distinct, outline attached] [19:Flat and Intact] [20:Distinct, outline attached] Wound Margin: [16:None Present (0%)] [19:None Present (0%)] [20:None Present (0%)] Granulation Amount: [16:N/A] [19:N/A] [20:N/A] Granulation Quality: [16:Large (67-100%)] [19:Large (67-100%)] [20:Large (67-100%)] Necrotic Amount: [16:Fat Layer (Subcutaneous Tissue)] [19:Fat Layer (Subcutaneous Tissue)] [20:Fat Layer (Subcutaneous Tissue)] Exposed Structures: [16:Exposed: Yes Fascia: No Tendon: No Muscle: No Joint: No Bone: No None] [19:Exposed: Yes Fascia: No Tendon: No Muscle: No Joint: No Bone: No Large (67-100%)] [20:Exposed: Yes Fascia: No Tendon: No Muscle: No Joint: No Bone: No None] Epithelialization: [16:Debridement - Excisional] [19:N/A] [20:N/A] Debridement: Pre-procedure Verification/Time Out 16:33 [19:N/A]  [20:N/A] Taken: [16:Subcutaneous, Slough] [19:N/A] [20:N/A] Tissue Debrided: [16:Skin/Subcutaneous Tissue] [19:N/A] [20:N/A] Level: [16:0.9] [19:N/A] [20:N/A] Debridement A (sq cm): [16:rea Curette] [19:N/A] [20:N/A] Instrument: [16:Minimum] [19:N/A] [20:N/A] Bleeding: [16:Pressure] [19:N/A] [20:N/A] Hemostasis A chieved: [16:0] [19:N/A] [20:N/A] Procedural Pain: [16:0] [19:N/A] [20:N/A] Post Procedural Pain: [16:Procedure was tolerated well] [19:N/A] [20:N/A] Debridement Treatment Response: [16:0.6x1.5x0.7] [19:N/A] [20:N/A] Post Debridement Measurements L x W x D (cm) [16:0.495] [19:N/A] [20:N/A] Post Debridement Volume: (cm) [16:Unstageable/Unclassified] [19:N/A] [20:N/A] Post Debridement Stage: [16:Compression Therapy] [19:Compression Therapy] [20:Compression Therapy] Procedures Performed: [16:Debridement 21] [19:22] [20:5] Photos: [16:No Photos Left, Lateral Lower Leg] [19:No Photos Left, Distal, Lateral Lower Leg] [20:No Photos Right, Medial Lower Leg] Wound Location: [16:Gradually Appeared] [19:Gradually Appeared] [20:Gradually Appeared] Wounding Event: [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] [19:Venous Leg Ulcer] [20:Venous Leg Ulcer] Primary Etiology: [16:Anemia, Hypertension, Peripheral] [19:Anemia, Hypertension, Peripheral] [20:Anemia, Hypertension, Peripheral] Comorbid History: [16:Arterial Disease, Peripheral Venous Disease, Raynauds, Scleroderma, Rheumatoid Arthritis, Osteoarthritis 06/14/2020] [19:Arterial Disease, Peripheral Venous Disease, Raynauds, Scleroderma, Rheumatoid Arthritis, Osteoarthritis  06/20/2020] [20:Arterial Disease, Peripheral Venous Disease, Raynauds, Scleroderma, Rheumatoid Arthritis, Osteoarthritis 01/13/2013] Date Acquired: [16:3] [19:2] [29:528] Weeks of Treatment: [16:Open] [19:Open] [20:Open] Wound Status: [16:Yes] [19:No] [20:Yes] Clustered Wound: [16:0.8x0.4x0.2] [19:0.5x0.4x0.1] [20:1.7x1.5x0.5] Measurements L x W x D (cm) [16:0.251] [19:0.157] [20:2.003] A (cm)  : rea [16:0.05] [19:0.016] [20:1.001] Volume (cm) : [16:85.80%] [19:44.50%] [20:-41.70%] % Reduction in Area: [16:71.80%] [19:42.90%] [20:-609.90%] % Reduction in Volume: [16:No] [19:No] [20:No] Undermining: [16:Full Thickness Without Exposed] [19:Full Thickness Without Exposed] [20:Full Thickness With Exposed Support] Classification: [16:Support Structures Small] [19:Support Structures Small] [20:Structures Medium] Exudate Amount: [16:Serous] [19:Serous] [20:Serosanguineous] Exudate Type: [16:amber] [19:amber] [20:red, brown] Exudate Color: [16:Flat and Intact] [19:Flat and Intact] [20:Distinct, outline attached] Wound Margin: [16:None Present (0%)] [19:None Present (0%)] [20:Small (1-33%)] Granulation Amount: [16:N/A] [19:N/A] [20:Pink] Granulation Quality: [16:Large (67-100%)] [19:Large (67-100%)] [20:Large (67-100%)] Necrotic Amount: [16:Fat Layer (Subcutaneous Tissue)] [19:Fat Layer (Subcutaneous Tissue)] [20:Fat Layer (Subcutaneous Tissue)] Exposed Structures: [16:Exposed: Yes Fascia: No Tendon: No Muscle: No Joint: No Bone: No  Small (1-33%)] [19:Exposed: Yes Fascia: No Tendon: No Muscle: No Joint: No Bone: No Small (1-33%)] [20:Exposed: Yes Fascia: No Tendon: No Muscle: No Joint: No Bone: No Small (1-33%)] Epithelialization: [16:N/A] [19:N/A] [20:N/A] Debridement: [16:N/A] [19:N/A] [20:N/A] Tissue Debrided: [16:N/A] [19:N/A] [20:N/A] Level: [16:N/A] [19:N/A] [20:N/A] Debridement A (sq cm): [16:rea N/A] [19:N/A] [20:N/A] Instrument: [16:N/A] [19:N/A] [20:N/A] Bleeding: [16:N/A] [19:N/A] [20:N/A] Hemostasis A chieved: [16:N/A] [19:N/A] [20:N/A] Procedural Pain: [16:N/A] [19:N/A] [20:N/A] Post Procedural Pain: Debridement Treatment Response: N/A [19:N/A] [20:N/A] Post Debridement Measurements L x N/A [19:N/A] [20:N/A] W x D (cm) [16:N/A] [19:N/A] [20:N/A] Post Debridement Volume: (cm) [16:N/A] [19:N/A] [20:N/A] Post Debridement Stage: [16:Compression Therapy] [19:Compression  Therapy] [20:Compression Therapy] Treatment Notes Electronic Signature(s) Signed: 07/05/2020 5:09:59 PM By: Linton Ham MD Signed: 07/05/2020 5:26:11 PM By: Levan Hurst RN, BSN Entered By: Linton Ham on 07/05/2020 16:53:11 -------------------------------------------------------------------------------- Multi-Disciplinary Care Plan Details Patient Name: Date of Service: [name], [name] 07/05/2020 3:30 PM Medical Record Number: [id number] Patient Account Number: [id number] Date of Birth/Sex: Treating RN: 1951/12/18 (69 y.o. [name] [name] Primary Care Jordanna Hendrie: [name], [place] R[place] Other Clinician: Referring Sol Englert: Treating Lisl Slingerland/Extender: [name] Drafts, MA RY Weeks in Treatment: [phone] Active Inactive Wound/Skin Impairment Nursing Diagnoses: Impaired tissue integrity Knowledge deficit related to ulceration/compromised skin integrity Goals: Patient/caregiver will verbalize understanding of skin care regimen Date Initiated: 10/11/2016 Target Resolution Date: 08/05/2020 Goal Status: Active Interventions: Assess patient/caregiver ability to obtain necessary supplies Assess patient/caregiver ability to perform ulcer/skin care regimen upon admission and as needed Assess ulceration(s) every visit Provide education on ulcer and skin care Treatment Activities: Skin care regimen initiated : 10/11/2016 Topical wound management initiated : 10/11/2016 Notes: Electronic Signature(s) Signed: 07/05/2020 5:26:11 PM By: Levan Hurst RN, BSN Entered By: Levan Hurst on 07/05/2020 17:19:08 -------------------------------------------------------------------------------- Pain Assessment Details Patient Name: Date of Service: [name] 07/05/2020 3:30 PM Medical Record Number: [id number] Patient Account Number: [id number] Date of Birth/Sex: Treating RN: November 19, 1951 (69 y.o. [name] [name] Primary Care Tyreonna Czaplicki: [name], [place] R[place] Other Clinician: Referring  Shadana Pry: Treating Jamayah Myszka/Extender: [name] Drafts, MA RY Weeks in Treatment: [phone] Active Problems Location of Pain Severity and Description of Pain Patient Has Paino No Site Locations Rate the pain. Current Pain Level: 0 Pain Management and Medication Current Pain Management: Electronic Signature(s) Signed: 07/05/2020 5:26:11 PM By: Levan Hurst RN, BSN Signed: 07/08/2020 2:13:12 PM By: Sandre Kitty Entered By: Sandre Kitty on 07/05/2020 16:02:26 -------------------------------------------------------------------------------- Patient/Caregiver Education Details Patient Name: Date of Service: [name] 7/6/2021andnbsp3:30 PM Medical Record Number: [id number] Patient Account Number: [id number] Date of Birth/Gender: Treating RN: 05-Mar-1951 (69 y.o. [name] [name] Primary Care Physician: [name], [place] R[place] Other Clinician: Referring Physician: Treating Physician/Extender: [name] Drafts, MA RY Weeks in Treatment: [phone] Education Assessment Education Provided To: Patient Education Topics Provided Wound/Skin Impairment: Methods: Explain/Verbal Responses: State content correctly Electronic Signature(s) Signed: 07/05/2020 5:26:11 PM By: Levan Hurst RN, BSN Entered By: Levan Hurst on 07/05/2020 17:20:57 -------------------------------------------------------------------------------- Wound Assessment Details Patient Name: Date of Service: [name] 07/05/2020 3:30 PM Medical Record Number: [id number] Patient Account Number: [id number] Date of Birth/Sex: Treating RN: Sep 29, 1951 (69 y.o. [name] Primary Care Weda Baumgarner: [name], [place] R[place] Other Clinician: Referring Ashira Kirsten: Treating Mckyla Deckman/Extender: [name] Drafts, MA RY Weeks in Treatment: 432 Wound Status Wound Number: 16 Primary Pressure Ulcer Etiology: Wound Location: Right Calcaneus Wound Open Wounding Event: Pressure Injury Status: Date Acquired:  01/04/2020 Comorbid Anemia, Hypertension, Peripheral Arterial Disease, Peripheral Weeks Of Treatment: 26 History: Venous Disease, Raynauds, Scleroderma, Rheumatoid Arthritis, Clustered Wound: No Osteoarthritis Photos Photo Uploaded By: Mikeal Hawthorne on 07/06/2020 13:40:12 Wound Measurements Length: (cm) 0.6 Width: (cm) 1.5 Depth: (cm) 0.7 Area: (cm) 0.707 Volume: (cm) 0.495 % Reduction in Area: 94.4% % Reduction in Volume: 60.6% Epithelialization: None Tunneling: No Undermining: Yes Starting Position (o'clock): 11 Ending Position (o'clock): 3 Maximum Distance: (cm) 0.4 Wound Description Classification: Unstageable/Unclassified Wound Margin: Distinct, outline attached Exudate Amount: Small Exudate Type: Purulent Exudate Color: yellow, brown, green Wound Bed Granulation Amount: None Present (0%) Necrotic Amount: Large (67-100%) Necrotic Quality: Adherent Slough Foul Odor After Cleansing: No Slough/Fibrino Yes Exposed Structure Fascia Exposed: No Fat Layer (Subcutaneous Tissue) Exposed: Yes Tendon Exposed: No Muscle Exposed: No Joint Exposed: No Bone Exposed: No Treatment Notes Wound #16 (Right Calcaneus) 1. Cleanse With Wound Cleanser Soap and water 2. Periwound Care Moisturizing lotion 3. Primary Dressing Applied Other primary dressing (specifiy in notes) 4. Secondary Dressing Dry Gauze 6. Support Layer Applied 3 layer compression wrap Notes patient refuses heel cup. sorbact (moisten with hydrogel) Electronic Signature(s) Signed: 07/05/2020 5:06:40 PM By: Baruch Gouty RN, BSN Entered By: Baruch Gouty on 07/05/2020 16:18:16 -------------------------------------------------------------------------------- Wound Assessment Details Patient Name: Date of Service: [name], [name] 07/05/2020 3:30 PM Medical Record Number: [id number] Patient Account Number: [id number] Date of Birth/Sex: Treating RN: 31-May-1951 (69 y.o. [name] Primary Care  Annlee Glandon: [name], [place] R[place] Other Clinician: Referring Kayal Mula: Treating Siera Beyersdorf/Extender: [name] Drafts, MA RY Weeks in Treatment: 432 Wound Status Wound Number: 19 Primary Venous Leg Ulcer Etiology: Wound Location: Right, Lateral Malleolus Wound Open Wounding Event: Gradually Appeared Status: Date Acquired: 05/02/2020 Comorbid Anemia, Hypertension, Peripheral Arterial Disease, Peripheral Weeks Of Treatment: 9 History: Venous Disease, Raynauds, Scleroderma, Rheumatoid Arthritis, Clustered Wound: No Osteoarthritis Photos Photo Uploaded By: Mikeal Hawthorne on 07/06/2020 13:15:39 Wound Measurements Length: (cm) 0.2 Width: (cm) 0.2 Depth: (cm) 0.1 Area: (cm) 0.031 Volume: (cm) 0.003 % Reduction in Area: 78% % Reduction in Volume: 89.3% Epithelialization: Large (67-100%) Tunneling: No Undermining: No Wound Description Classification: Full Thickness Without Exposed Support Structures Wound Margin: Flat and Intact Exudate Amount: Small Exudate Type: Serous Exudate Color: amber Foul Odor After Cleansing: No Slough/Fibrino No Wound Bed Granulation Amount: None Present (0%) Exposed Structure Necrotic Amount: Large (67-100%) Fascia Exposed: No Necrotic Quality: Adherent Slough Fat Layer (Subcutaneous Tissue) Exposed: Yes Tendon Exposed: No Muscle Exposed: No Joint Exposed: No Bone Exposed: No Treatment Notes Wound #19 (Right, Lateral Malleolus) 1. Cleanse With Wound Cleanser Soap and water 2. Periwound Care Moisturizing lotion 3. Primary Dressing Applied Other primary dressing (specifiy in notes) 4. Secondary Dressing Dry Gauze 6. Support Layer Applied 3 layer compression wrap Notes patient refuses heel cup. sorbact (moisten with hydrogel) Electronic Signature(s) Signed: 07/05/2020 5:06:40 PM By: Baruch Gouty RN, BSN Entered By: Baruch Gouty on 07/05/2020  16:19:00 -------------------------------------------------------------------------------- Wound Assessment Details Patient Name: Date of Service: [name], [name] 07/05/2020 3:30 PM Medical Record Number: [id number] Patient Account Number: [id number] Date of Birth/Sex: Treating RN: 1951/07/12 (69 y.o. 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, Lateral Malleolus Wound Open Wounding Event: Gradually Appeared Status: Date Acquired: 05/02/2020 Comorbid Anemia, Hypertension, Peripheral Arterial Disease, Peripheral Weeks Of Treatment: 9 History:  Venous Disease, Raynauds, Scleroderma, Rheumatoid Arthritis, Clustered Wound: No Osteoarthritis Photos Photo Uploaded By: Mikeal Hawthorne on 07/06/2020 13:40:12 Wound Measurements Length: (cm) 0.8 Width: (cm) 0.8 Depth: (cm) 0.3 Area: (cm) 0.50 Volume: (cm) 0.15 % Reduction in Area: -14.3% % Reduction in Volume: -71.6% Epithelialization: None 3 Tunneling: No 1 Undermining: No Wound Description Classification: Full Thickness Without Exposed Support Struc Wound Margin: Distinct, outline attached Exudate Amount: Medium Exudate Type: Serosanguineous Exudate Color: red, brown tures Foul Odor After Cleansing: No Slough/Fibrino Yes Wound Bed Granulation Amount: None Present (0%) Exposed Structure Necrotic Amount: Large (67-100%) Fascia Exposed: No Necrotic Quality: Adherent Slough Fat Layer (Subcutaneous Tissue) Exposed: Yes Tendon Exposed: No Muscle Exposed: No Joint Exposed: No Bone Exposed: No Treatment Notes Wound #20 (Left, Lateral Malleolus) 1. Cleanse With Wound Cleanser Soap and water 2. Periwound Care Moisturizing lotion 3. Primary Dressing Applied Other primary dressing (specifiy in  notes) 4. Secondary Dressing Dry Gauze 6. Support Layer Applied 3 layer compression wrap Notes patient refuses heel cup. sorbact (moisten with hydrogel) Electronic Signature(s) Signed: 07/05/2020 5:06:40 PM By: Baruch Gouty RN, BSN Entered By: Baruch Gouty on 07/05/2020 16:25:59 -------------------------------------------------------------------------------- Wound Assessment Details Patient Name: Date of Service: [name], [name] 07/05/2020 3:30 PM Medical Record Number: [id number] Patient Account Number: [id number] Date of Birth/Sex: Treating RN: [birth year] (69 y.o. [name] Primary Care Radha Coggins: [name], [place] RY Other Clinician: Referring Nason Conradt: Treating Johm Pfannenstiel/Extender: [name] Drafts, MA RY Weeks in Treatment: 432 Wound Status Wound Number: 21 Primary Venous Leg Ulcer Etiology: Wound Location: Left, Lateral Lower Leg Wound Open Wounding Event: Gradually Appeared Status: Date Acquired: 06/14/2020 Comorbid Anemia, Hypertension, Peripheral Arterial Disease, Peripheral Weeks Of Treatment: 3 History: Venous Disease, Raynauds, Scleroderma, Rheumatoid Arthritis, Clustered Wound: Yes Osteoarthritis Photos Photo Uploaded By: Mikeal Hawthorne on 07/06/2020 13:39:39 Wound Measurements Length: (cm) 0.8 Width: (cm) 0.4 Depth: (cm) 0.2 Area: (cm) 0.25 Volume: (cm) 0.05 % Reduction in Area: 85.8% % Reduction in Volume: 71.8% Epithelialization: Small (1-33%) 1 Tunneling: No Undermining: No Wound Description Classification: Full Thickness Without Exposed Support Struc Wound Margin: Flat and Intact Exudate Amount: Small Exudate Type: Serous Exudate Color: amber tures Foul Odor After Cleansing: No Slough/Fibrino Yes Wound Bed Granulation Amount: None Present (0%) Exposed Structure Necrotic Amount: Large (67-100%) Fascia Exposed: No Necrotic Quality: Adherent Slough Fat Layer (Subcutaneous Tissue) Exposed: Yes Tendon Exposed: No Muscle  Exposed: No Joint Exposed: No Bone Exposed: No Treatment Notes Wound #21 (Left, Lateral Lower Leg) 1. Cleanse With Wound Cleanser Soap and water 2. Periwound Care Moisturizing lotion 3. Primary Dressing Applied Other primary dressing (specifiy in notes) 4. Secondary Dressing Dry Gauze 6. Support Layer Applied 3 layer compression wrap Notes patient refuses heel cup. sorbact (moisten with hydrogel) Electronic Signature(s) Signed: 07/05/2020 5:06:40 PM By: Baruch Gouty RN, BSN Entered By: Baruch Gouty on 07/05/2020 16:27:14 -------------------------------------------------------------------------------- Wound Assessment Details Patient Name: Date of Service: [name], [name] 07/05/2020 3:30 PM Medical Record Number: [id number] Patient Account Number: [id number] Date of Birth/Sex: Treating RN: [birth year] (69 y.o. [name] Primary Care Teegan Guinther: [name], [place] RY Other Clinician: Referring Anu Stagner: Treating Lateesha Bezold/Extender: [name] Drafts, MA RY Weeks in Treatment: 432 Wound Status Wound Number: 22 Primary Venous Leg Ulcer Etiology: Wound Location: Left, Distal, Lateral Lower Leg Wound Open Wounding Event: Gradually Appeared Status: Date Acquired: 06/20/2020 Comorbid Anemia, Hypertension, Peripheral Arterial Disease, Peripheral Weeks Of Treatment: 2 History: Venous Disease, Raynauds, Scleroderma, Rheumatoid Arthritis, Clustered Wound: No Osteoarthritis Photos  Photo Uploaded By: Mikeal Hawthorne on 07/06/2020 13:40:29 Wound Measurements Length: (cm) 0.5 Width: (cm) 0.4 Depth: (cm) 0.1 Area: (cm) 0.157 Volume: (cm) 0.016 % Reduction in Area: 44.5% % Reduction in Volume: 42.9% Epithelialization: Small (1-33%) Tunneling: No Undermining: No Wound Description Classification: Full Thickness Without Exposed Support Structures Wound Margin: Flat and Intact Exudate Amount: Small Exudate Type: Serous Exudate Color: amber Foul Odor After  Cleansing: No Slough/Fibrino Yes Wound Bed Granulation Amount: None Present (0%) Exposed Structure Necrotic Amount: Large (67-100%) Fascia Exposed: No Necrotic Quality: Adherent Slough Fat Layer (Subcutaneous Tissue) Exposed: Yes Tendon Exposed: No Muscle Exposed: No Joint Exposed: No Bone Exposed: No Treatment Notes Wound #22 (Left, Distal, Lateral Lower Leg) 1. Cleanse With Wound Cleanser Soap and water 2. Periwound Care Moisturizing lotion 3. Primary Dressing Applied Other primary dressing (specifiy in notes) 4. Secondary Dressing Dry Gauze 6. Support Layer Applied 3 layer compression wrap Notes patient refuses heel cup. sorbact (moisten with hydrogel) Electronic Signature(s) Signed: 07/05/2020 5:06:40 PM By: Baruch Gouty RN, BSN Entered By: Baruch Gouty on 07/05/2020 40:08:67 -------------------------------------------------------------------------------- Wound Assessment Details Patient Name: Date of Service: [name], [name] 07/05/2020 3:30 PM Medical Record Number: [id number] Patient Account Number: [id number] Date of Birth/Sex: Treating RN: December 13, 1951 (69 y.o. [name] Primary Care Ammanda Dobbins: [name], [place] RY Other Clinician: Referring Randee Huston: Treating Kaybree Williams/Extender: [name] Drafts, MA RY Weeks in Treatment: 432 Wound Status Wound Number: 5 Primary Venous Leg Ulcer Etiology: Wound Location: Right, Medial Lower Leg Wound Open Wounding Event: Gradually Appeared Status: Date Acquired: 01/13/2013 Comorbid Anemia, Hypertension, Peripheral Arterial Disease, Peripheral Weeks Of Treatment: 388 History: Venous Disease, Raynauds, Scleroderma, Rheumatoid Arthritis, Clustered Wound: Yes Osteoarthritis Photos Photo Uploaded By: Mikeal Hawthorne on 07/06/2020 13:39:39 Wound Measurements Length: (cm) 1.7 Width: (cm) 1.5 Depth: (cm) 0.5 Area: (cm) 2.003 Volume: (cm) 1.001 % Reduction in Area: -41.7% % Reduction in Volume:  -609.9% Epithelialization: Small (1-33%) Tunneling: No Undermining: No Wound Description Classification: Full Thickness With Exposed Support Structures Wound Margin: Distinct, outline attached Exudate Amount: Medium Exudate Type: Serosanguineous Exudate Color: red, brown Foul Odor After Cleansing: No Slough/Fibrino Yes Wound Bed Granulation Amount: Small (1-33%) Exposed Structure Granulation Quality: Pink Fascia Exposed: No Necrotic Amount: Large (67-100%) Fat Layer (Subcutaneous Tissue) Exposed: Yes Necrotic Quality: Adherent Slough Tendon Exposed: No Muscle Exposed: No Joint Exposed: No Bone Exposed: No Treatment Notes Wound #5 (Right, Medial Lower Leg) 1. Cleanse With Wound Cleanser Soap and water 2. Periwound Care Moisturizing lotion 3. Primary Dressing Applied Other primary dressing (specifiy in notes) 4. Secondary Dressing Dry Gauze 6. Support Layer Applied 3 layer compression wrap Notes patient refuses heel cup. sorbact (moisten with hydrogel) Electronic Signature(s) Signed: 07/05/2020 5:06:40 PM By: Baruch Gouty RN, BSN Entered By: Baruch Gouty on 07/05/2020 16:24:38 -------------------------------------------------------------------------------- Vitals Details Patient Name: Date of Service: [name], [name] 07/05/2020 3:30 PM Medical Record Number: [id number] Patient Account Number: [id number] Date of Birth/Sex: Treating RN: September 04, 1951 (68 y.o. [name] [name] Primary Care Rydge Texidor: [name], [place] RY Other Clinician: Referring Maanav Kassabian: Treating Jaina Morin/Extender: [name] Drafts, MA RY Weeks in Treatment: 432 Vital Signs Time Taken: 15:59 Temperature (F): 98.3 Height (in): 68 Pulse (bpm): 75 Weight (lbs): 132 Respiratory Rate (breaths/min): 20 Body Mass Index (BMI): 20.1 Blood Pressure (mmHg): 177/68 Reference Range: 80 - 120 mg / dl Electronic Signature(s) Signed: 07/08/2020 2:13:12 PM By: Sandre Kitty Entered By: Sandre Kitty on 07/05/2020 15:59:37

## 2020-07-18 ENCOUNTER — Encounter (HOSPITAL_BASED_OUTPATIENT_CLINIC_OR_DEPARTMENT_OTHER): Payer: Medicare Other | Admitting: Internal Medicine

## 2020-07-18 DIAGNOSIS — L89616 Pressure-induced deep tissue damage of right heel: Secondary | ICD-10-CM | POA: Diagnosis not present

## 2020-07-18 DIAGNOSIS — L97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: Secondary | ICD-10-CM | POA: Diagnosis not present

## 2020-07-18 DIAGNOSIS — I83222 Varicose veins of left lower extremity with both ulcer of calf and inflammation: Secondary | ICD-10-CM | POA: Diagnosis not present

## 2020-07-18 DIAGNOSIS — L97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: Secondary | ICD-10-CM | POA: Diagnosis not present

## 2020-07-18 DIAGNOSIS — L97811 Non-pressure chronic ulcer of other part of right lower leg limited to breakdown of skin: Secondary | ICD-10-CM | POA: Diagnosis not present

## 2020-07-18 DIAGNOSIS — I87331 Chronic venous hypertension (idiopathic) with ulcer and inflammation of right lower extremity: Secondary | ICD-10-CM | POA: Diagnosis not present

## 2020-07-19 NOTE — Progress Notes (Signed)
CHANNELL, QUATTRONE ([id number]) Visit Report for 07/18/2020 Arrival Information Details Patient Name: Date of Service: [name] 07/18/2020 11:15 A M Medical Record Number: [id number] Patient Account Number: [id number] Date of Birth/Sex: Treating RN: 1951/10/08 (69 y.o. [name], [name] Primary Care Zylpha Poynor: [name], [place] RY Other Clinician: Referring Tiffony Kite: Treating Bettye Sitton/Extender: [name], MA RY Weeks in Treatment: 284 Visit Information History Since Last Visit Added or deleted any medications: No Patient Arrived: Ambulatory Any new allergies or adverse reactions: No Arrival Time: 11:37 Had a fall or experienced change in No Accompanied By: spouse activities of daily living that may affect Transfer Assistance: None risk of falls: Patient Identification Verified: Yes Signs or symptoms of abuse/neglect since last visito No Secondary Verification Process Completed: Yes Hospitalized since last visit: No Patient Requires Transmission-Based Precautions: No Implantable device outside of the clinic excluding No Patient Has Alerts: No cellular tissue based products placed in the center since last visit: Has Dressing in Place as Prescribed: Yes Has Compression in Place as Prescribed: Yes Pain Present Now: No Electronic Signature(s) Signed: 07/19/2020 6:10:08 PM By: Baruch Gouty RN, BSN Entered By: Baruch Gouty on 07/18/2020 11:38:36 -------------------------------------------------------------------------------- Compression Therapy Details Patient Name: Date of Service: [name] 07/18/2020 11:15 A M Medical Record Number: [id number] Patient Account Number: [id number] Date of Birth/Sex: Treating RN: Aug 21, 1951 (69 y.o. [name] Primary Care Sudie Bandel: [name], [place] R[place] Other Clinician: Referring Charnika Herbst: Treating Jasmine Maceachern/Extender: [name], MA RY Weeks in Treatment: 434 Compression Therapy Performed for Wound  Assessment: Wound #5 Right,Medial Lower Leg Performed By: Clinician Baruch Gouty, RN Compression Type: Three Hydrologist) Signed: 07/19/2020 6:10:08 PM By: Baruch Gouty RN, BSN Entered By: Baruch Gouty on 07/18/2020 12:13:52 -------------------------------------------------------------------------------- Compression Therapy Details Patient Name: Date of Service: [name], [name] 07/18/2020 11:15 A M Medical Record Number: [id number] Patient Account Number: [id number] Date of Birth/Sex: Treating RN: 01-Feb-1951 (69 y.o. [name] Primary Care Pleshette Tomasini: [name], [place] R[place] Other Clinician: Referring Cheikh Bramble: Treating Margeret Stachnik/Extender: [name], MA RY Weeks in Treatment: 434 Compression Therapy Performed for Wound Assessment: Wound #20 Left,Lateral Malleolus Performed By: Clinician Baruch Gouty, RN Compression Type: Three Hydrologist) Signed: 07/19/2020 6:10:08 PM By: Baruch Gouty RN, BSN Entered By: Baruch Gouty on 07/18/2020 12:13:52 -------------------------------------------------------------------------------- Encounter Discharge Information Details Patient Name: Date of Service: [name], [name] 07/18/2020 11:15 A M Medical Record Number: [id number] Patient Account Number: [id number] Date of Birth/Sex: Treating RN: 1951/08/07 (69 y.o. [name] Primary Care Kamalei Roeder: [name], [place] R[place] Other Clinician: Referring Jusitn Salsgiver: Treating Mercer Peifer/Extender: [name], MA RY Weeks in Treatment: [phone] Encounter Discharge Information Items Discharge Condition: Stable Ambulatory Status: Ambulatory Discharge Destination: Home Transportation: Private Auto Accompanied By: spouse Schedule Follow-up Appointment: Yes Clinical Summary of Care: Patient Declined Electronic Signature(s) Signed: 07/19/2020 6:10:08 PM By: Baruch Gouty RN, BSN Entered By: Baruch Gouty on 07/18/2020  12:15:50 -------------------------------------------------------------------------------- Patient/Caregiver Education Details Patient Name: Date of Service: [name] 7/19/2021andnbsp11:15 A M Medical Record Number: [id number] Patient Account Number: [id number] Date of Birth/Gender: Treating RN: [birth year] (69 y.o. [name] Primary Care Physician: [name], [place] R[place] Other Clinician: Referring Physician: Treating Physician/Extender: [name], MA RY Weeks in Treatment: [phone] Education Assessment Education Provided To: Patient Education Topics Provided Venous: Methods: Explain/Verbal Responses: Reinforcements needed, State content correctly Wound/Skin Impairment: Methods:  Explain/Verbal Responses: Reinforcements needed, State content correctly Electronic Signature(s) Signed: 07/19/2020 6:10:08 PM By: Baruch Gouty RN, BSN Entered By: Baruch Gouty on 07/18/2020 12:15:32 -------------------------------------------------------------------------------- Wound Assessment Details Patient Name: Date of Service: [name], [name] 07/18/2020 11:15 A M Medical Record Number: [id number] Patient Account Number: [id number] Date of Birth/Sex: Treating RN: 1951/07/15 (69 y.o. [name] Primary Care Kyiesha Millward: [name], [place] R[place] Other Clinician: Referring Quinteria Chisum: Treating Mesa Janus/Extender: [name], MA RY Weeks in Treatment: 434 Wound Status Wound Number: 16 Primary Pressure Ulcer Etiology: Wound Location: Right Calcaneus Wound Open Wounding Event: Pressure Injury Status: Date Acquired: 01/04/2020 Comorbid Anemia, Hypertension, Peripheral Arterial Disease, Peripheral Weeks Of Treatment: 28 History: Venous Disease, Raynauds, Scleroderma, Rheumatoid Arthritis, Clustered Wound: No Osteoarthritis Wound Measurements Length: (cm) 0.8 Width: (cm) 1.6 Depth: (cm) 0.8 Area: (cm) 1.005 Volume: (cm) 0.804 % Reduction in Area: 92% %  Reduction in Volume: 36% Epithelialization: None Tunneling: No Undermining: No Wound Description Classification: Unstageable/Unclassified Wound Margin: Well defined, not attached Exudate Amount: Small Exudate Type: Purulent Exudate Color: yellow, brown, green Foul Odor After Cleansing: No Slough/Fibrino Yes Wound Bed Granulation Amount: None Present (0%) Exposed Structure Necrotic Amount: Large (67-100%) Fascia Exposed: No Necrotic Quality: Adherent Slough Fat Layer (Subcutaneous Tissue) Exposed: Yes Tendon Exposed: No Muscle Exposed: No Joint Exposed: No Bone Exposed: No Treatment Notes Wound #16 (Right Calcaneus) 2. Periwound Care Moisturizing lotion 3. Primary Dressing Applied Hydrogel or K-Y Jelly Other primary dressing (specifiy in notes) 4. Secondary Dressing Dry Gauze 6. Support Layer Applied 3 layer compression Water quality scientist) Signed: 07/19/2020 6:10:08 PM By: Baruch Gouty RN, BSN Entered By: Baruch Gouty on 07/18/2020 12:10:11 -------------------------------------------------------------------------------- Wound Assessment Details Patient Name: Date of Service: [name], [name] 07/18/2020 11:15 A M Medical Record Number: [id number] Patient Account Number: [id number] Date of Birth/Sex: Treating RN: January 10, 1951 (69 y.o. [name] Primary Care Yasuo Phimmasone: [name], [place] R[place] Other Clinician: Referring Marlon Vonruden: Treating Trayce Maino/Extender: [name], MA RY Weeks in Treatment: 434 Wound Status Wound Number: 19 Primary Venous Leg Ulcer Etiology: Wound Location: Right, Lateral Malleolus Wound Open Wounding Event: Gradually Appeared Status: Date Acquired: 05/02/2020 Comorbid Anemia, Hypertension, Peripheral Arterial Disease, Peripheral Weeks Of Treatment: 11 History: Venous Disease, Raynauds, Scleroderma, Rheumatoid Arthritis, Clustered Wound: No Osteoarthritis Wound Measurements Length: (cm) 0.3 Width: (cm) 0.1 Depth:  (cm) 0.1 Area: (cm) 0.024 Volume: (cm) 0.002 % Reduction in Area: 83% % Reduction in Volume: 92.9% Epithelialization: Large (67-100%) Tunneling: No Undermining: No Wound Description Classification: Full Thickness Without Exposed Support Structures Wound Margin: Flat and Intact Exudate Amount: None Present Foul Odor After Cleansing: No Slough/Fibrino No Wound Bed Granulation Amount: None Present (0%) Exposed Structure Necrotic Amount: None Present (0%) Fascia Exposed: No Fat Layer (Subcutaneous Tissue) Exposed: No Tendon Exposed: No Muscle Exposed: No Joint Exposed: No Bone Exposed: No Treatment Notes Wound #19 (Right, Lateral Malleolus) 2. Periwound Care Moisturizing lotion 3. Primary Dressing Applied Hydrogel or K-Y Jelly Other primary dressing (specifiy in notes) 4. Secondary Dressing Dry Gauze 6. Support Layer Applied 3 layer compression Water quality scientist) Signed: 07/19/2020 6:10:08 PM By: Baruch Gouty RN, BSN Entered By: Baruch Gouty on 07/18/2020 12:10:59 -------------------------------------------------------------------------------- Wound Assessment Details Patient Name: Date of Service: [name], [name] 07/18/2020 11:15 A M Medical Record Number: [id number] Patient Account Number: [id number] Date of Birth/Sex: Treating RN: 04-Nov-1951 (69 y.o. [name] Primary Care Deryl Ports: [name], [place] R[place] Other Clinician: Referring Cathie Bonnell: Treating  Dyson Sevey/Extender: [name], MA RY Weeks in Treatment: 434 Wound Status Wound Number: 20 Primary Arterial Insufficiency Ulcer Etiology: Wound Location: Left, Lateral Malleolus Wound Open Wounding Event: Gradually Appeared Status: Date Acquired: 05/02/2020 Comorbid Anemia, Hypertension, Peripheral Arterial Disease, Peripheral Weeks Of Treatment: 11 History: Venous Disease, Raynauds, Scleroderma, Rheumatoid Arthritis, Clustered Wound: No Osteoarthritis Wound  Measurements Length: (cm) 0.7 Width: (cm) 0.7 Depth: (cm) 0.4 Area: (cm) 0.385 Volume: (cm) 0.154 % Reduction in Area: 12.5% % Reduction in Volume: -75% Epithelialization: None Tunneling: No Undermining: No Wound Description Classification: Full Thickness Without Exposed Support Structures Wound Margin: Well defined, not attached Exudate Amount: Small Exudate Type: Purulent Exudate Color: yellow, brown, green Foul Odor After Cleansing: No Slough/Fibrino Yes Wound Bed Granulation Amount: None Present (0%) Exposed Structure Necrotic Amount: Large (67-100%) Fascia Exposed: No Necrotic Quality: Adherent Slough Fat Layer (Subcutaneous Tissue) Exposed: Yes Tendon Exposed: No Muscle Exposed: No Joint Exposed: No Bone Exposed: No Electronic Signature(s) Signed: 07/19/2020 6:10:08 PM By: Baruch Gouty RN, BSN Entered By: Baruch Gouty on 07/18/2020 12:11:28 -------------------------------------------------------------------------------- Wound Assessment Details Patient Name: Date of Service: [name] 07/18/2020 11:15 A M Medical Record Number: [id number] Patient Account Number: [id number] Date of Birth/Sex: Treating RN: [birth year] (69 y.o. [name] Primary Care Odalys Win: [name], [place] R[place] Other Clinician: Referring Ariann Khaimov: Treating Keni Wafer/Extender: [name], MA RY Weeks in Treatment: 434 Wound Status Wound Number: 21 Primary Venous Leg Ulcer Etiology: Wound Location: Left, Lateral Lower Leg Wound Open Wounding Event: Gradually Appeared Status: Date Acquired: 06/14/2020 Comorbid Anemia, Hypertension, Peripheral Arterial Disease, Peripheral Weeks Of Treatment: 4 Weeks Of Treatment: 4 History: Venous Disease, Raynauds, Scleroderma, Rheumatoid Arthritis, Clustered Wound: Yes Osteoarthritis Wound Measurements Length: (cm) 0.8 Width: (cm) 0.5 Depth: (cm) 0.1 Area: (cm) 0.314 Volume: (cm) 0.031 % Reduction in Area: 82.2% %  Reduction in Volume: 82.5% Epithelialization: Large (67-100%) Tunneling: No Undermining: No Wound Description Classification: Full Thickness Without Exposed Support Structures Wound Margin: Flat and Intact Exudate Amount: Small Exudate Type: Serous Exudate Color: amber Foul Odor After Cleansing: No Slough/Fibrino Yes Wound Bed Granulation Amount: None Present (0%) Exposed Structure Necrotic Amount: None Present (0%) Fascia Exposed: No Fat Layer (Subcutaneous Tissue) Exposed: No Tendon Exposed: No Muscle Exposed: No Joint Exposed: No Bone Exposed: No Electronic Signature(s) Signed: 07/19/2020 6:10:08 PM By: Baruch Gouty RN, BSN Entered By: Baruch Gouty on 07/18/2020 12:12:06 -------------------------------------------------------------------------------- Wound Assessment Details Patient Name: Date of Service: [name] 07/18/2020 11:15 A M Medical Record Number: [id number] Patient Account Number: [id number] Date of Birth/Sex: Treating RN: 04-24-1951 (69 y.o. [name] Primary Care Milayna Rotenberg: [name], [place] R[place] Other Clinician: Referring Remberto Lienhard: Treating Limmie Schoenberg/Extender: [name], MA RY Weeks in Treatment: 434 Wound Status Wound Number: 22 Primary Venous Leg Ulcer Etiology: Wound Location: Left, Distal, Lateral Lower Leg Wound Open Wounding Event: Gradually Appeared Status: Date Acquired: 06/20/2020 Comorbid Anemia, Hypertension, Peripheral Arterial Disease, Peripheral Weeks Of Treatment: 4 History: Venous Disease, Raynauds, Scleroderma, Rheumatoid Arthritis, Clustered Wound: No Osteoarthritis Wound Measurements Length: (cm) 0.4 Width: (cm) 0.4 Depth: (cm) 0.1 Area: (cm) 0.126 Volume: (cm) 0.013 % Reduction in Area: 55.5% % Reduction in Volume: 53.6% Epithelialization: Large (67-100%) Tunneling: No Undermining: No Wound Description Classification: Full Thickness Without Exposed Support Structures Wound Margin: Flat and  Intact Exudate Amount: Small Exudate Type: Serous Exudate Color: amber Foul Odor After Cleansing: No Slough/Fibrino Yes Wound Bed Granulation Amount: Small (1-33%) Exposed Structure Granulation Quality: Pink  Fascia Exposed: No Necrotic Amount: None Present (0%) Fat Layer (Subcutaneous Tissue) Exposed: Yes Tendon Exposed: No Muscle Exposed: No Joint Exposed: No Bone Exposed: No Electronic Signature(s) Signed: 07/19/2020 6:10:08 PM By: Baruch Gouty RN, BSN Entered By: Baruch Gouty on 07/18/2020 12:12:34 -------------------------------------------------------------------------------- Wound Assessment Details Patient Name: Date of Service: [name] 07/18/2020 11:15 A M Medical Record Number: [id number] Patient Account Number: [id number] Date of Birth/Sex: Treating RN: 04/16/1951 (69 y.o. [name] Primary Care Dima Ferrufino: [name], [place] R[place] Other Clinician: Referring Tunisha Ruland: Treating Kura Bethards/Extender: [name], MA RY Weeks in Treatment: 434 Wound Status Wound Number: 5 Primary Venous Leg Ulcer Etiology: Wound Location: Right, Medial Lower Leg Wound Open Wounding Event: Gradually Appeared Status: Date Acquired: 01/13/2013 Comorbid Anemia, Hypertension, Peripheral Arterial Disease, Peripheral Weeks Of Treatment: 390 History: Venous Disease, Raynauds, Scleroderma, Rheumatoid Arthritis, Clustered Wound: Yes Osteoarthritis Wound Measurements Length: (cm) 1.5 Width: (cm) 2.1 Depth: (cm) 0.5 Area: (cm) 2.474 Volume: (cm) 1.237 % Reduction in Area: -75% % Reduction in Volume: -777.3% Epithelialization: Small (1-33%) Tunneling: No Undermining: No Wound Description Classification: Full Thickness With Exposed Support Structures Wound Margin: Distinct, outline attached Exudate Amount: Medium Exudate Type: Purulent Exudate Color: yellow, brown, green Foul Odor After Cleansing: No Slough/Fibrino Yes Wound Bed Granulation Amount:  Small (1-33%) Exposed Structure Granulation Quality: Pink Fascia Exposed: No Necrotic Amount: Large (67-100%) Fat Layer (Subcutaneous Tissue) Exposed: Yes Necrotic Quality: Adherent Slough Tendon Exposed: No Muscle Exposed: No Joint Exposed: No Bone Exposed: No Treatment Notes Wound #5 (Right, Medial Lower Leg) 2. Periwound Care Moisturizing lotion 3. Primary Dressing Applied Hydrogel or K-Y Jelly Other primary dressing (specifiy in notes) 4. Secondary Dressing Dry Gauze 6. Support Layer Applied 3 layer compression Water quality scientist) Signed: 07/19/2020 6:10:08 PM By: Baruch Gouty RN, BSN Entered By: Baruch Gouty on 07/18/2020 12:13:17 -------------------------------------------------------------------------------- Vitals Details Patient Name: Date of Service: [name], [name] 07/18/2020 11:15 A M Medical Record Number: [id number] Patient Account Number: [id number] Date of Birth/Sex: Treating RN: 23-Jan-1951 (69 y.o. [name] Primary Care Deette Revak: [name], [place] R[place] Other Clinician: Referring Cheryn Lundquist: Treating Steffie Waggoner/Extender: [name], MA RY Weeks in Treatment: 434 Vital Signs Time Taken: 11:38 Temperature (F): 98.3 Height (in): 68 Pulse (bpm): 71 Source: Stated Respiratory Rate (breaths/min): 18 Weight (lbs): 132 Blood Pressure (mmHg): 165/67 Source: Stated Reference Range: 80 - 120 mg / dl Body Mass Index (BMI): 20.1 Electronic Signature(s) Signed: 07/19/2020 6:10:08 PM By: Baruch Gouty RN, BSN Entered By: Baruch Gouty on 07/18/2020 11:39:09

## 2020-07-19 NOTE — Progress Notes (Signed)
[name], [name] ([id number]) Visit Report for 07/18/2020 SuperBill Details Patient Name: Date of Service: [name] 07/18/2020 Medical Record Number: [id number] Patient Account Number: [id number] Date of Birth/Sex: Treating RN: May 25, 1951 (69 y.o. 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 Electronic Signature(s) Signed: 07/19/2020 6:00:01 PM By: Linton Ham MD Signed: 07/19/2020 6:10:08 PM By: Baruch Gouty RN, BSN Entered By: Baruch Gouty on 07/18/2020 12:16:19

## 2020-07-25 ENCOUNTER — Encounter (HOSPITAL_BASED_OUTPATIENT_CLINIC_OR_DEPARTMENT_OTHER): Payer: Medicare Other | Admitting: Internal Medicine

## 2020-07-25 DIAGNOSIS — L97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: Secondary | ICD-10-CM | POA: Diagnosis not present

## 2020-07-25 DIAGNOSIS — I87331 Chronic venous hypertension (idiopathic) with ulcer and inflammation of right lower extremity: Secondary | ICD-10-CM | POA: Diagnosis not present

## 2020-07-25 DIAGNOSIS — I83202 Varicose veins of unspecified lower extremity with both ulcer of calf and inflammation: Secondary | ICD-10-CM | POA: Diagnosis not present

## 2020-07-25 DIAGNOSIS — L97811 Non-pressure chronic ulcer of other part of right lower leg limited to breakdown of skin: Secondary | ICD-10-CM | POA: Diagnosis not present

## 2020-07-25 DIAGNOSIS — I83222 Varicose veins of left lower extremity with both ulcer of calf and inflammation: Secondary | ICD-10-CM | POA: Diagnosis not present

## 2020-07-25 DIAGNOSIS — L89616 Pressure-induced deep tissue damage of right heel: Secondary | ICD-10-CM | POA: Diagnosis not present

## 2020-07-25 DIAGNOSIS — L97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: Secondary | ICD-10-CM | POA: Diagnosis not present

## 2020-07-26 DIAGNOSIS — L858 Other specified epidermal thickening: Secondary | ICD-10-CM | POA: Diagnosis not present

## 2020-07-26 DIAGNOSIS — Z85828 Personal history of other malignant neoplasm of skin: Secondary | ICD-10-CM | POA: Diagnosis not present

## 2020-07-26 DIAGNOSIS — Z08 Encounter for follow-up examination after completed treatment for malignant neoplasm: Secondary | ICD-10-CM | POA: Diagnosis not present

## 2020-07-27 NOTE — Progress Notes (Signed)
JEWELDEAN, DROHAN ([id number]) Visit Report for 07/25/2020 HPI Details Patient Name: Date of Service: [name] [name] 07/25/2020 11:00 A M Medical Record Number: [id number] Patient Account Number: [id number] Date of Birth/Sex: Treating RN: Sep 01, 1951 (69 y.o. [name] Primary Care Provider: [name], [place] RY Other Clinician: Referring Provider: Treating Provider/Extender: [name], MA RY Weeks in Treatment: 951 History of Present Illness HPI Description: this is a patient who initially came to [place] for wounds on the medial malleoli bilaterally as well as her upper medial lower extremities bilaterally. These wounds eventually healed with assistance of Apligraf's bilaterally. While this was occurring she developed the current wound which opened into a fairly substantial wound on the right lower extremity. These are mostly secondary to venous stasis physiology however the patient also has underlying scleroderma, pulmonary hypertension. The wound has been making good progress lately with the Hydrofera Blue-based dressing. 03/17/2015; patient had a arterial evaluation a year ago. Her right ABI was 0.86 left was 1.0. T brachial index was 0.41 on the right 0.45 on the left. Her oe bilateral common femoral artery waveforms were triphasic. Her white popliteal posterior tibial artery and anterior tibial artery waveforms were monophasic with good amplitude. Luteal artery waveforms were biphasic it was felt that her bilateral great toe pressures are of normal although adequate for tissue healing. 03/24/2015. The condition of this wound is not really improved that. He was covered with as fibrinous surface slough and eschar. This underwent an aggressive debridement with both a curette and scalpel. I still cannot really get down to what I can would consider to be a viable surface. There is no evidence of infection. Previous workup for ischemia roughly a year ago was negative nevertheless I  think that continues to be a concern 04/07/15. The patient arrived for application of second Apligraf. Once again the surface of this wound is certainly less viable than I would like for an advanced treatment option. An aggressive debridement was done. She developed some arteriolar bleeding which required that along pressure and silver alginate. 04/21/15: change in the condition of this wound. Once again it is covered in a gelatinous surface slough. After debridement today surface of the wound looks somewhat better but now a heeling surface 05/05/15 Apligraf #4 applied.Still a lot of slough on this wound. 05/19/15 Apligraf #5 applied. Still a lot of slough on this wound I did not aggressively remove this 06/02/15; continued copious amounts of surface slough. This debridement fairly easily. 06/08/2015 -- the last time she had a venous duplex study done was over 3 years ago and the surgery was prior to that. I have recommended that she sees Dr. early for a another opinion regarding a repeat venous duplex and possibly more endovenous ablation of vein stripping of micro-phlebotomies. 06/16/15; wound has a gelatinous surface eschar that the debridement fairly easily to a point. I don't disagree with the venous workup and perhaps even arterial re-evaluation. She is on prednisone 5 mg and continue his medications for her pulmonary artery hypertension I am not sure if the latter have any wound care healing issues I would need to investigate this. 06/23/15 continues with a gelatinous surface eschar with of fibrinous underlying. What I can see of this wound does not look unhealthy however I just can't get this material which I think is 2 different layers off. Empiric culture done 06/30/15; unfortunately not a lot of change in this wound. A gelatinous surface eschar is easily removed however  it has a tight fibrinous surface underneath the. 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 The wound comes back  and basically and unchanged state the. She has a gelatinous surface that is more easily removed however there is a tightly fibrinous surface underneath the. There is no evidence of infection. She has a vascular follow-up next month. I would have to inject her in order to do a more aggressive debridement of this area 07/21/15 the wound is roughly in the same state albeit the debridement is done with greater ease. There is less of the fibrinous eschar underneath the. There is no evidence of infection. She has follow-up with vascular surgery next week. No evidence of surrounding infection. Her original distal wounds healed while this one formed. 07/28/15; wound is easier to debride. No wound erythema. She is seeing Dr. [name] next week. 08/18/15 Has been to [place] for repeat ablation. Have been using medihoney pad with some improvement 08/25/15; absolutely no change in the condition of this wound in either its overall size or surface condition in many months now. At one point I had this down to [place] healthier surface I think with [facility] however this did not actually progress towards closure. Do not believe that the wound has actually deteriorated in terms of volume at all. We have been using a medihoney pad which allows easier debridement of the gelatinous eschar but again no overall actual improvement. the patient is going towards an ablation with pain and vascular which I think is scheduled for next week. The only other investigation that I could first see would be a biopsy. She does have underlying scleroderma 09/02/15 eschar is much easier to debridement however the base of this does not look particularly vibrant. We changed to Iodoflex. The patient had her ablation earlier this week 09/09/15 again the debridement over the base of this wound is easier and the base of the wound looks considerably better. We will continue the Iodoflex. Dr. Tawni Millers has expressed his satisfaction with the result of  her ablation 09/15/15 once again the wound is relatively free of surface eschar. No debridement was done today. It has a pale-looking base to it. although this is not as deep as it once was it seems to be expanding especially inferiorly. She has had recent venous ablation but this is no closer to healing.I've gone ahead and done a punch biopsy this from the inferior part of the wound close to normal skin 09/22/15: the wound is relatively free of surface eschar. There is some surrounding eschar. I'm not exactly sure at what level the surface is that I am seeing. 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 No evidence of atypical infection, malignancy. 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 No debridement was done 09/29/15; the wound surface is relatively free of surface eschar. There is eschar surrounding the walls of the wound. Aside from the improvement in the amount of surface slough. This wound has not progressed any towards closure. There is not even a surface that looks like there at this is ready. There is no evidence of any infection nor maligancy based on biopsy I did on 9/15. I continued with the Iodoflex however I am looking towards some alternative to try to promote some closure or filling in of this surface. Consider triple layer [place]. Collagen did not result in adequate control of the surface slough 10/13/15; the patient was in hospital last week with severe anemia.  The wound looks somewhat better after debridement although there is widening medially. There is no evidence of infection. 10/20/15; patient's wound on the right lateral lower leg is essentially unchanged. This underwent a light surface debridement and in general the debridement is easier and the surface looks improved. I noted in doing this on the side of the wound what appears to be a piece of calcium deposition. The patient noted that she had previous things on the tips  of her fingers. 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 She follows with rheumatology and I have asked them to talk to her about this. In view of that S the nonhealing ulcer may have something to do with calcinosis and also unrelieved Raynaud's disease in this area. I should note that her original wounds on the right and left medial malleolus and the inner aspects of both legs just below the knees did however heal over 10/30/15; the patient's wound on the right lateral lower leg is essentially unchanged. I was able to remove some calcified material from the medial wound edge. 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 Otherwise the wound appears essentially unchanged there is less adherent surface eschar. Some of the calcified material was sent to pathology for analysis 11/17/15. The patient's wound on the right lateral leg is essentially unchanged. Wider Medially. 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 There is some surface slough but I did not debridement this. No evidence of surrounding infection. 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 This will make healing difficult 12/15/15; the wound has less depth surface has a fibrinous slough and calcifications in the wound edge no evidence of infection 12/22/15; the wound definitely has less Fibrinous slough on the base. Calcifications around the wound edges are still evident. Although the wound bed looks healthier it is still pale in appearance. Previous biopsy did not show malignancy 01/04/15; surgical debridement of nonviable slough and  subcutaneous tissue the wound cleans up quite nicely but appears to be expanding outward calcifications around the wound edges are still there. 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 She is to see her rheumatologist I'll see if he has any opinions on this. My punch biopsy done in September did not show calcifications although these are clearly evident. 01/19/16 light selective debridement of nonviable surface slough. There is epithelialization medially. This gives me reason for cautious optimism. She has been to see her rheumatologist, there is nothing that can be done for this type of soft tissue calcification associated with scleroderma 02/02/16 no debridement although there is a light surface slough. She has 2 peninsulas of skin 1 inferiorly and one medially. We continue to make a slight and slow but definite progressive here 02/16/16; light surface debridement with more attention to the circumference of the wound bed where the fibrinous eschar is more prevalent. No calcifications detected. She seems to have done nicely with the [facility] with some epithelialization and some improvement in the overall wound volume. 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  Continued attention to the circumference of the wound where the fibrinous eschar in calcinosis or prevalent. No calcifications were detected. She has continued improvement with Hydrofera Blue. The wound is no longer as deep 03/15/16 surgical debridement done to remove surface escha Especially around the circumference of the wound where there is nonviable subcutaneous tissue. In spite of this there is considerable improvement in the overall dimensions and depth of the wound. Islands of epithelialization are seen especially medially inferiorly and superiorly to a lesser extent.  She is using Hydrofera Blue at home 03/29/16; surgical debridement done to remove surface eschar and nonviable subcutaneous tissue. This cleans up quite nicely mention slightly larger in terms of length and width however depth is less 04/12/16; continued gradual improvement in terms of depth and the condition of the wound base. Debridement is done. Continuing long standing Hydrofera Blue at home with Kerlix Coban wraps 04/26/16; continued gradual improvement in terms of depth and management as well as condition of the wound base. Surgical debridement done she continues with Hydrofera Blue. 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 She initially came to this clinic venous insufficiency ulcers which have long since healed 05/17/16 continued improvement in terms of the depth and measurements of this wound although she has a tightly adherent fibrinous slough each time. We've been continued with long standing Hydrofera Blue which seems to done as well for this wound is many advanced treatment options. 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 She also has chronic venous insufficiency. She has an irritation on her lateral right ankle secondary to our wraps 05/10/16; wound appears to be smaller especially on the medial aspect and especially in the width. Wound was debridement surface looks better. She is also been in the hospital apparently with anemia again she tells me she had an endoscopy. Since she got home after 3 days which I believe was sometime last week she has had an irritated painful area on the right lateral ankle surrounding the lateral malleolus 05/31/16; much more adherent surface slough today then recently although I don't think the dimensions of changed that much. A more aggressive debridement is required. The irritated area over her medial malleolus is more pruritic and painful and I don't think represents  cellulitis 06/14/16; no major change in her wound dimensions however there is more tightly adherent surface slough which is disappointing. As well as she appears to have a new small area medially. Furthermore an irritated uncomfortable area on the lateral aspect of her right foot just below the lateral malleolus. 06/21/16; I'm seeing the patient back in follow-up for the new areas under her major wound on the right anterior leg. She has been using Hydrofera Blue to this area probably for several months now and although the dressings seem to be helping for quite a period of time I think things have stagnated lately. She comes in today with a relatively tight adherent surface slough and really no changes in the wound shape or dimension. The 2 small areas she had inferiorly are tiny but still open they seem improved this well. There is no uncontrolled edema and I don't think there is any evidence of cellulitis. 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 Patient has had vascular evaluations both venous and arterial. I have biopsied this area. There is no obvious infection. The worrisome  thing today is that she seems to be developing areas of erythema and epithelial damage on the medial aspect of the right foot. Also to a lesser degree inferior to the actual wound itself. Again I see no obvious changes to suggest cellulitis however as this is the only treatable option I will probably give her antibiotics. 07/13/16 no major change in the lady's large anterior right leg wound. Still covered with a very tightly adherent surface slough which is difficult to debridement. There is less erythema around this, culture last week grew pseudomonas I gave her ciprofloxacin. The area on her lateral right malleolus looks better- 08/02/16 the patient's wounds continued to decline. Her original large anterior right leg  wound looks deeper. Still adherent surface slough that is difficult to debridement. She has a small area just below this and a punched-out wound just below her lateral malleolus. In the meantime she is been in hospital with apparently an upper GI bleed on Plavix and aspirin. She is now just on Plavix she received 3 units of packed cells 08/23/16; since I last saw this 3 weeks ago, the open large area on her right leg looks about the same syrup. She has a small satellite lesion just underneath this. The area on her medial right ankle is now a deep necrotic wound. I attempted to debridement this however there is just too much pain. 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 She follows with vascular surgery and is scheduled for an angiogram in early September 09/06/16; the patient is going for an arteriogram tomorrow. Her original large wound on the right calf is about the same the satellite lesion underneath it is about the same however the area on her medial ankle is now deeper with exposed tendon. I am no longer attempting to debride these wounds 09/20/16; the patient has undergone a right femoral endarterectomy and Dacron patch angioplasty. This seems to have helped the flow in her right leg. 10/04/16; Arrived today for aggressive debridement of the wounds on her right calf the original wound the one beneath it and a difficult area over her right lateral leg just above the lateral malleolus. 10/25/16; her 3 open wounds are about the same in terms of dimensions however the surface appears a lot healthier post debridement. Using Iodoflex 10/18/16 we have been using Iodoflex to her wounds which she tolerated with some difficulty. 10/11/16; has been using Santyl for a period of time with some improvement although again very adherent surface slough would prevent any attempted healing this. She has a original wound on the left calf, the  satellite underneath that and the most recent wound on the right medial ankle. She has completed revascularization by Dr. early and has had venous ablation earlier. Want to go back to Iodoflex to see if week and get a healthier surface to this wound bed failing this I think she'll need to be taken to the OR and I am prepared to call Dr. Marla Roe to discuss this. She is obviously not a good candidate for general anesthesia however.; 11/08/16; I put her on Iodoflex last time to see if I can get the wound bed any healthier and unfortunately today still had tremendous surface slough. 11/15/16; 4 weeks' worth of Iodoflex with not much improvement. Debridement on the major wounds on her left anterior leg is easier however this does not maintain from week to week. The punched-out area on her medial right  ankle 11/29/16; I attempted to change the patient last visit to [facility] however she states this burned and was very uncomfortable therefore we gave her permission to go back to the eye out of complex which she already had at home. Also she noted a lot of pain and swelling on the lateral aspect of her leg before she traveled to [facility] for the holidays, I called her in doxycycline over the phone. This seems to have helped 12/06/16; Wounds unchanged by in large. Using Iodoflex 12/13/16; her wounds today actually looks somewhat better. The area on the right lateral lower leg has reasonably healthy-looking granulation and perhaps as actually filled and a bit. Debridement of the 2 wounds on the medial calf is easier and post debridement appears to have a healthier base. We have referred her to Dr. Migdalia Dk for consideration of operative debridement 12/20/16; we have a quick note from Dr. Merri Ray who feels that the patient needs to be referred to an academic center/plastic surgeon. This is due to the complexity of the patient's medical issues as it applies to anything in the OR. We  have been using Iodoflex 12/27/16; in general the wounds on the right leg are better in terms of the difficult to remove surface slough. She has been using Iodoflex. She is approved for Apligraf which I anticipated ordering in the next week or 2 when we get a better-looking surface 01/04/16 the deep wounds on the right leg generally look better. Both of them are debrided further surface slough. The area on the lateral right leg was not debrided. She is approved for Apligraf I think I'll probably order this either next week or the week after depending on the surface of the wounds superiorly. We have been using Iodoflex which will continue until then 01/11/16; the deep wounds on the right leg again have a surface slough that requires debridement. I've not been able to get the wound bed on either one of these wounds down to what would be acceptable for an advanced skin stab-like Apligraf. The area on the medial leg has been improving. We have been using hot Iodoflex to all wounds which seems to do the best at at least limiting the nonviable surface 01/24/17; we have continued Iodoflex and all her wound areas. Her debridement Gen. he is easier and the surface underneath this looks viable. Nevertheless these are large area wounds with exposed muscle at least on the anterior parts. We have ordered Apligraf's for 2 weeks from now. The patient will be away next week 02/07/17; the patient was close to have first Apligraf today however we did not order one. I therefore replaced her Iodoflex. She essentially has 3 large punched- out areas on her right anterior leg and right medial ankle. 02/11/17; Apligraf #1 02/25/17; Apligraf #2. In general some improvement in the right medial ankle and right anterior leg wounds. The larger wound medially perhaps some better 03/11/17; Apligraf #3. In general continued improvement in the right medial ankle and the right anterior leg wounds. 03/25/17 Apligraf #4. In general continued  improvement especially on the right medial ankle and the lower 04/08/17; Apligraf #5 in general continued improvement in all wound sites. 04/22/17; post Apligraf #5 her wound beds continued to look a lot better all of them up to the surface of the surrounding skin. Had a caramel-colored slough that I did not debrided in case there is residual Apligraf effect. The wounds are as good as I have seen these looking quite some  time. 04/29/17; we applied [place] and last week after completing her Apligraf. Wounds look as though they've contracted somewhat although they have a nonviable surface which was problematic in the past. Apparently she has been approved for further Apligraf's. We applied Iodosorb today after debridement. 05/06/17; we're fortunate enough today to be able to apply additional Apligraf approved by her insurance. In general all of her wounds look better Apligraf #6 05/24/17; Apligraf #7 continued improvement in all wounds 3 06/10/17 Apligraf #8. Continued improvement in the surface of all wounds. Not much of an improvement in dimensions as I might a follow 06/24/17 Apligraf #9 continued general improvement although not as much change in the wound areas I might of like. She has a new open area on the right anterior lateral ankle very small and superficial. She also has a necrotic wound on the tip of her right index finger probably secondary to severe uncontrolled Raynaud's phenomenon. She is already on sildenafil and already seen her rheumatologist who gave her Keflex. 07/08/17; Apligraf #10. Generalized improvement although she has a small additional wound just medial to the major wound area. 07/22/17; after discussion we decided not to reorder any further Apligraf's although there is been considerable improvement with these it hasn't been recently. The major wound anteriorly looks better. Smaller wounds beneath this and the more recent one and laterally look about the same. The area on the  right lateral lower leg looks about the same 07/29/17 this is a patient who is exceedingly complex. She has advanced scleroderma, crest syndrome including calcinosis, PAD status post revascularization, chronic venous insufficiency status post ablations. She initially presented to this clinic with wounds on her bilateral lower legs however these closed. More recently we have been dealing with a large open area superiorly on the right anterior leg, a smaller wound underneath this and then another one on the right medial lower extremity. These improve significantly with 10 Apligraf applications. Over the last 2-3 weeks we are making good progress with Hydrofera Blue and these seem to be making progress towards closure 08/19/17; wounds continued to make good improvement with Hydrofera Blue and episodic aggressive debridements 08/26/17; still using Hydrofera Blue. Good improvements 09/09/17; using Hydrofera Blue continued improvement. Area on the lateral part of her right leg has only a small remaining open area. The small inferior satellite region is for all intents and purposes closed. Her major wound also is come in in terms of depth and has advancing epithelialization. 09/16/17; using Hydrofera Blue with continued improvement. The smaller satellite wound. We've closed out today along with a new were satellite wound medially. The area on her medial ankle is still open and her major wound is still open but making improvement. All using Hydrofera Blue. Currently 09/30/17; using Hydrofera Blue. Still a small open area on the lateral right ankle area and her original major wound seems to be making gradual and steady improvement. 10/14/17; still using Hydrofera Blue. Still too small open areas on the right lateral ankle. Her original major wound is horizontal and linear. The most problematic area paradoxically seems to be the area to the medial area wears I thought it would be the lateral. 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 10/28/17; still using Hydrofera Blue. Right lateral ankle has a very small open area superiorly on the most lateral part of the wound. Her original open wound has 2 open areas now separated by normal skin and we've redefined this.  11/11/17; still using Hydrofera Blue area and right lateral ankle continues to have a small open area on mostly the lateral part of the wound. Her original wound has 2 small open areas now separated by a considerable amount of normal skin 11/28/17; the patient called in slightly before Thanksgiving to report pain and erythema above the wound on the right leg. In the past this is responded well to treatment for cellulitis and I gave her over the phone doxycycline. She stated this resulted in fairly abrupt improvement. We have been using Hydrofera Blue for a prolonged period of time to the larger wound anteriorly into the remaining wound on her right right lateral ankle. The latter is just about closed with only a small linear area and the bottom of the [place]. 12/02/17; use endoform and left the dressing on since last visit. There is no tenderness and no evidence of infection. 12/16/17; patient has been using Endoform but not making much progress. The 2 punched out open areas anteriorly which were the reminiscence of her major wound appear deeper. The area on the lateral aspect of her right calf also appears deeper. Also she has a puzzling tender swelling above her wound on the right leg. This seems larger than last time. Just above her wounds there appears to be some fluctuance in this area it is not erythematous and there is no crepitus 12/30/17; patient has been using Endoform up until last week we used Hydrofera Blue. Ultrasound of the swelling above her 2 major wounds last time was negative for a fluid collection. I gave her cefaclor for the erythema and tenderness in this area which seems better.  Unfortunately both punched out areas anteriorly and the area on her right medial lower leg appear deeper. In fact the lateral of the wounds anteriorly actually looks as though it has exposed tendon and/or muscle sheath. She is not systemically unwell. She is complaining of vaginitis type symptoms presumably Candida from her antibiotics. 01/06/18; we're using santyl. she has 2 punched out areas anteriorly which were initially part of a large wound. Unfortunately medially this is now open to tendon/muscle. All the wounds have the same adherent very difficult to debride surface. 01/20/18; 2 week follow-up using Santyl. She has the 2 punched out areas anteriorly which were initially part of her large surface wound there. Medially this still has exposed muscle. All of these have the same tightly adherent necrotic surface which requires debridement. PuraPly was not accepted by the patient's insurance however her insurance I think it changed therefore we are going to run Apligraf to gain 02/03/18; the patient has been using Santyl. The wound on the right lateral ankle looks improved and the 2 areas anteriorly on the right leg looks about the same. The medial one has exposed muscle. The lateral 1 requiresdebridement. We use PuraPly today for the first week 02/10/18; PuraPly #2. The patient has 3 wounds. The area on the right lateral ankle, 2 areas anteriorly that were part of her original large wound in this area the medial one has exposed muscle. All of the wounds were lightly debrided with a number 3 curet. PuraPly #2 applied the lateral wound on the calf and the right lateral ankle look better. 02/17/18; PuraPly #3. Patient has 3 wounds. The area on the right lateral ankle in 2 areas internally that were part of her original large wound. The lateral area has exposed muscle. She arrived with some complaints of pain around the right ankle. 02/24/18; PuraPly #4; not  much change in any of the 3 wound areas. Right  lateral ankle, right lateral calf. Both of these required debridement with a #3 curet. She tolerates this marginally. The area on the medial leg still has exposed muscle. Not much change in dimensions 03/03/18 PuraPly #5. The area on the medial ankle actually looks better however the 2 separate areas that were original parts of the larger right anterior leg wound look as though they're attempting to coalesce. 03/10/18; PuraPly #6. The area on the medial ankle actually continues to look and measures smaller however the 2 separate areas that were part of the original large wound on the right anterior leg have now coalesced. There hasn't been much improvement here. The lateral area actually has underlying exposed muscle 03/17/18-she is here in follow-up evaluation for ulcerations to the right lower extremity. She is voicing no complaints or concerns. She tolerated debridement. Puraply#7 placed 03/24/18; difficult right lower extremity ulcerations. PuraPly #8 place. She is been approved for [employer]. She did very well with Apligraf today however she is apparently reached her "lifetime max" 03/31/18; marginal improvement with PuraPly although her wounds looked as good as they have in several weeks today. Used TheraSkins #1 04/14/18 TheraSkin #2 today 04/28/18 TheraSkins #3. Wound slightly improved 05/12/18; TheraSkin skin #4. Wound response has been variable. 05/27/18 TheraSkin #5. Generally improvement in all wound areas. I've also put her in 3 layer compression to help with the severe venous hypertension 06/09/18; patient is done quite well with the TheraSkins unfortunately we have no further applications. I also put her in 3 layer compression last week and that really seems to of helped. 06/16/18; we have been using silver collagen. Wounds are smaller. Still be open area to the muscle layer of her calf however even that is contracted somewhat. She tells me that at night sometimes she has pain on the right  lateral calf at the site of her lower wound. Notable that I put her into 3 layer compression about 3 weeks ago. She states that she dangles her leg over the bed that makes it feel better but she does not describe claudication during the day She is going to call her secondary insurance to see if they will continue to cover advanced treatment products I have reviewed her arterial studies from 01/22/17; this showed an ABI in the right of 1 and on the left noncompressible. TBI on the right at 0.30 on the left at 0.34. It is therefore possible she has significant PAD with medial calcification falsely elevating her ABI into the normal range. I'll need to be careful about asking her about this next week it's possible the 3 layer compression is too much 06/23/18; was able to reapply TheraSkin 1 today. Edema control is good and she is not complaining of pain no claudication 07/07/18;no major change. New wound which was apparently a taper removal injury today in our clinic between her 2 wounds on the right calf TheraSkins #2 07/14/18; I think there is some improvement in the right lateral ankle and the medial part of her wound. There is still exposed muscle medially. 07/28/18; two-week follow-up. TheraSkins#3. Unfortunately no major change. 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 She seems to be doing well and in the base of the wound appears to show some progress at this point. This  is the last approved Theraskin of the second set. 08/25/18; she has completed TheraSkin. There has been some improvement on the right lateral calf wound as well as the anterior leg wounds. The open area to muscle medially on the anterior leg wound is smaller. I'm going to transition her back to [facility] under Kerlix Coban change every  second day. She reports that she had some calcification removed from her right upper arm. We have had previous problems with calcifications in her wounds on her legs but that has not happened recently 09/08/18;using Hydrofera Blue on both her wound areas. Wounds seem to of contracted nicely. She uses Kerlix Coban wrap and changes at home herself 09/22/2018; using Hydrofera Blue on both her wound areas. Dimensions seem to have come down somewhat. There is certainly less depth in the medial part of the mid tibia wound and I do not think there is any exposed muscle at this point. 10/06/2018; 2-week follow-up. Using [facility] on her wound areas. Dimensions have come down nicely both on the right lateral ankle area in the right mid tibial area. She has no new complaints 10/20/2018; 2-week follow-up. She is using Hydrofera Blue. Not much change from the last time she was here. The area on the lateral ankle has less depth although it has raised edges on one side. I attempted to remove as much of the raised edge as I could without creating more additional wounds. The area on the right anterior mid tibia area looks the same. 11/03/2018; 2-week follow-up she is using Hydrofera Blue. On the right anterior leg she now has 2 wounds separated by a large area of normal skin. The area on the medial part still has I think exposed muscle although this area itself is a lot smaller. The area laterally has some depth. Both areas with necrotic debris. The area on her right lateral ankle has come in nicely 11/17/2018; patient continues to use Hydrofera Blue. We have been increasing separation of the 2 wounds anteriorly which were at one point joint the area on the right lateral calf continues to have I think some improvement in depth. 12/08/2018; patient continues to use Hydrofera Blue. There is some improvement in the area on the right lateral calf. The 2 areas that were initially part of the original large wound in the  mid right tibia are probably about the same. In fact the medial area is probably somewhat larger. We will run puraply through the patient's insurance 12/22/2018; she has been using Hydrofera Blue. We have small wounds on the right lateral calf and 2 small areas that were initially part of a large wound in the right mid tibia. We applied pure apply #1 today. 12/29/2018; we applied puraply #2. Her wounds look somewhat better especially on the right lateral calf and the lateral part of her original wound in the mid tibial area. 01/05/2019; perhaps slightly improved in terms of wound bed condition but certainly not as much improvement as I might of liked. Puraply #3 1/13: we did not have a correct sized puraply to apply. wounds more pinched out looking, I increased her compression to 3 layer last week to help with significant multilevel venous hypertension. Since then I've reviewed her arterial status. She has a right femoral endarterectomy and a distal left SFA stent. She was being followed by Dr. [name] for a period however she does not appear to have seen him in 3 years. I will set up an appointment. 1/20. The patient has an  additional wound on the right lateral calf between the distal wound and proximal wounds. We did not have Puraply last week. Still does not have a follow-up with Dr. early 1/27: Follow-up with Dr. early has been arranged apparently with follow-up noninvasive studies. Wounds are measuring roughly the same although they certainly look smaller 2/3; the patient had non invasive studies. Her ABIs on the right were 0.83 and on the left 1.02 however there was no great toe pressure bilaterally. Also worrisome monophasic waveforms at the PTA and dorsalis pedis. We are still using Puraply. We have had some improvement in all of the wounds especially the lateral part of the mid tibial area. 2/10; sees Dr. early of vein and vascular re-arterial studies next week. Puraply reapplied today. 2/17;  Dr. early of vein and vascular his appointment is tomorrow. Puraply reapplied after debridement of all wounds 2/24; the patient saw Dr. early I reviewed his note. sHe noted the previous right femoral endarterectomy with a Dacron patch. He also noted the normal ABI and the monophasic waveforms suggesting tibial disease. Overall he did not feel that she had any evidence of arterial insufficiency that would impair her wound healing. He did note her venous disease as well. He suggested PRN follow-up. 3/2; I had the last puraply applied today. The original wounds over the mid tibia area are improved where is the area on the right lower leg is not 3/9; wounds are smaller especially in the right mid tibia perhaps slightly in the right lateral calf. We finished with puraply and went to endoform today 3/23; the patient arrives after 2 weeks. She has been using endoform. I think all of her wounds look slightly better which includes the area on the right lateral calf just above the right lateral malleolus and the 2 in the right mid tibia which were initially part of the same wound. 4/27; TELEHEALTH visit; the patient was seen for telehealth visit today with her consent in the middle of the worldwide epidemic. Since she was last here she called in for antibiotics with pain and tenderness around the area on the right medial ankle. I gave her empiric doxycycline. She states this feels better. She is using endoform on both of these areas 5/11 TELEHEALTH; the patient was seen for telehealth visit today. She was accompanied at home by her husband. She has severe pulmonary hypertension accompanied scleroderma and in the face of the Covid epidemic cannot be safely brought into our clinic. We have been using endoform on her wound areas. There are essentially 3 wound areas now in the left mid tibia now 2 open areas that it one point were connected and one on the right lateral ankle just above the malleolus. 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 She cannot come to clinic. The wounds were reviewed today via telehealth. She has severe chronic venous hypertension which I think is centrally mediated. She has wounds on her right anterior tibia and right lateral ankle area. These are chronic. She has been using endoform. 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 We have been following her from telehealth. She has severe chronic venous hypertension which may be mostly centrally mediated secondary to right heart heart failure.  She has wounds on her right anterior tibia and right lateral ankle these are chronic we have been using endoform 6/22; TELEHEALTH; this patient was seen today via telehealth. 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 Since the last time we had contact with this patient she developed some pain and erythema around the wound on her right lateral malleolus/ankle and we put in antibiotics for her. This is resulted in good improvement with resolution of the erythema and tenderness. I changed her to silver alginate last time. We had been using endoform for an extended period of time 7/13; TELEHEALTH; this patient was seen today via telehealth. 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 She would continue to be at a prohibitive risk to be brought into our clinic unless this was absolutely necessary. These visits have been done with her approval as well as her husband. We have been using silver alginate to the areas on the right mid tibia and right lateral lower leg. 7/27  TELEHEALTH; patient was seen along with her husband today via telehealth. 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 We changed her to sample last visit. She has 2 areas a chronic wound on her right mid tibia and one just above her ankle. These were not the original wounds when she came into this clinic but she developed them during treatment 8/17; she comes in for her first face-to-face visit in a long period. She has a remaining area just medial to the right tibia which is the last open part of her large wound across this area. She also has an area on the right lateral lower leg. We prescribed Santyl last telehealth visit but they were concerned that this was making a deeper so they put silver alginate on it last week. Her husband changes the dressings. 8/31; using Santyl to the 2 wound areas some improvement in wound surfaces. Husband has surgery in 2 weeks we will put her out 3 weeks. Any of the advanced treatment options that I can think of that would be eligible for this wound would also cause her to have to come in weekly. The risk that the patient is just too high 9/21. Using Santyl to the 2 wound areas. Both of these are somewhat better although the medial mid tibia area still has exposed muscle. Lateral ankle requiring debridement. Using Santyl 10/12; using Santyl to the 2 wound areas. One on the right lateral ankle and the other in the medial calf which still has exposed muscle. Both areas have come down in size and have better looking surfaces. She has made nice progress with santyl 11/2; we have been using Santyl to the 2 wound areas. Right lateral ankle and the other in the mid tibia area the medial part of this still has exposed muscle. 11/23/2019 on evaluation today patient appears to be doing about the same really with regard to her wounds. She is actually not very pleased with how things seem to  be progressing at this point she tells me that she really has not noted much improvement unfortunately. With that being said there is no signs of active infection at this time. There is some slough buildup noted at this time which again along with some dry skin around the edges of the wound I think would benefit her to  try to debride some of this away. Fortunately her pain is doing fairly well. She still has exposed muscle in the right medial/tibial area. 12/14; TELEHEALTH; she was changed to [facility] to the right calf wound and right lateral ankle wound when she was here last time. Unfortunately since then she had a fall with a pelvic fracture and a fracture of her wrist. She was apparently hospitalized for 5 days. I have not looked at her discharge summary. She apparently came out of the hospital with a blister on her right heel. She was seen today via telehealth by myself and our case manager. The patient and her husband were present. She has been using Hydrofera Blue at our direction from the last time she was in the clinic. There is been no major improvement in fact the areas appear deeper and with a less viable surface 01/04/2020; TELEHEALTH; the patient was seen today in accompaniment of her husband and our nurse. She has 3 open areas 1 on the right medial mid tibia, one on the right lateral ankle and a large eschared pressure area on the right heel. We have been using Iodoflex to the 2 original wounds. 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 It is simply too perilous for her to be seen in any other way 1/26; TELEHEALTH; the patient was seen today in accompaniment of her husband and our RN one of our nurses. She still has the 3 open areas 1 on the right medial mid tibia which is the remanent of a more extensive wound in this area, one on the right lateral ankle and a large eschared pressure area on the right heel. We have been using Iodoflex to the 2  original wounds and a bed at 9 application to the eschared area on the heel 2/15; the first time we have seen this patient and then several months out of concern for the pandemic. She had a large horizontal wound in the mid tibia. Only the medial aspect of this is still open. Area on the lateral ankle is just about closed. She had a new pressure ulcer on the right heel which I have removed some of the eschar. We have been using Iodoflex which I will continue. The area in the mid tibia has a round circle in the middle of exposed muscle. I think we would have to use an advanced treatment product to stimulate granulation over this area. We will run this through her insurance. She is not eligible for plastic surgery for many reasons 3/1; TELEHEALTH. The patient was seen today by telehealth. 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 We have been using Iodoflex to the wound areas which include the right anterior mid tibia, right lateral ankle and the right heel. All of these were reviewed 3/8; the patient's wound just above the right ankle is closed. She still has a contracting black eschar on the heel where she had a pressure ulcer. The medial part of her original wound on the mid tibia has exposed muscle. We have made really made no progress in this area although we have managed to get a lot of the original wound in this area to close. We used Apligraf #1 today 3/22; the patient's ankle wound remains closed. She still has a contracting black eschar on the heel although it seems to have less surface area. It still not clear whether there is any depth  here. We used Apligraf #2 today in an attempt to get granulation over the exposed muscle and what is left of her mid tibia area. 4/5; everything is closed except the medial aspect of the mid tibia wound, as well as the pressure injury on the right heel. She has been  using Betadine to the right heel which has been gradually contracting. We used Apligraf #3 today. 4/19; Apligraf #4. Still a pressure area on the right heel she has been using Betadine superficial excoriated areas she has been applying salicylic acid to on the anterior leg and the thick horn on the leg just above her wound area 5/3; Apligraf #5. Unfortunately she came in today with a reopening of the area on the right lateral lower leg. Not much change in the wound we have been treating in the mid calf. Quite a bit of edema surrounding this wound. She reports that she was up on her feet quite a bit. The area on the right heel is separating she has been using Betadine She comes in with a new wound on the left lateral malleolus which is very disappointing this is been open since last week 5/17; we applied her last Apligraf last time. Unfortunately that did not have any effect on the deep area medially in the mid tibial area. However the area over the right lateral malleolus is a lot better. Right heel also is contracted we used Iodoflex last time. The new wound on the left lateral malleolus were using Santyl to. This required debridement. 6/1. Not much change in the area on the right medial mid tibia. Right lateral ankle is improved she has the necrotic area on the right heel which was a pressure area. New area on the left lateral malleolus from last time. I changed her to Iodoflex to the area on the left lateral malleolus she said this hurt I have put her back on Santyl 6/15; right mid tibia is unchanged. I do not think there is anything I can do to this topically that we will get this to granulate over the muscle. And I am furthermore I am not sure that she is a surgical candidate either because of her severe pulmonary issues or because she really does not want to go through it. The area on the right lateral malleolus is a small punched out area. The area on the left lateral malleolus again small  punched out with nonviable surface Pressure ulcer on the right heel at separating black eschar. I went ahead and remove this today she has an area on the left anterior mid tibia just laterally. Geographic wounds debris on the surface. Some erythema here. 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 There is skin changes widely Change the primary dressing to Sorbact to all wound areas. She needs compression on the left leg as well as the right 6/21; right mid tibia which was her one remaining wound at 1 point is unchanged The area on the right lateral malleolus actually is close to closing over still a small punched out area The area on the left lateral malleolus again a deep wound it is this 1 that she feels pain in. Pressure ulcer on the right heel was cultured today. New area on the left anterior mid tibia several geographic wounds last week in the setting of chronic venous insufficiency this actually looks some better 7/6; Right mid tibia about the same. Exposed muscle Left lateral malleolus again necrotic debris over the surface. Pressure ulcer on  the right heel. She finished the Keflex we prescribed. Necrotic debris debrided with a #3 curette The rest of her wounds on the right mid tibia right lateral ankle are better Her husband reminds me that she had a right femoral endarterectomy and has 2 stents in her left thigh placed in 2011 approximately by vascular surgery in [place]. She saw Dr. [name] last in February 2020. At that point he did not think that the arterial insufficiency she had on the right was sufficient to explain any of her right lower extremity wounds however she now has an area on the left lateral malleolus that looks like an ischemic wound 7/12; we have been using Sorbact all wound areas Right mid tibia about the same exposed muscle Left lateral malleolus small wound with debris in the surface punched out Pressure ulcer of the right heel requiring  debridement of necrotic debris Lateral ankle wound on the right is just about closed Right mid tibia wound about the same still with exposed muscle. 7/26 really no change in any wounds. We have been using Sorbact. She has bilateral lower extremity wounds. This is in the setting of scleroderma, severe venous hypertension. She also has known PAD and I had really hoped to be able to get a review by Dr. [name] but apparently that is not booked until August 31. I have asked her to call back to vein and vascular and get an appointment with somebody a little earlier if that is possible. Electronic Signature(s) Signed: 07/25/2020 6:00:25 PM By: Linton Ham MD Entered By: Linton Ham on 07/25/2020 13:32:07 -------------------------------------------------------------------------------- Physical Exam Details Patient Name: Date of Service: [name] LO [name], [name] 07/25/2020 11:00 A M Medical Record Number: [id number] Patient Account Number: [id number] Date of Birth/Sex: Treating RN: Jan 11, 1951 (69 y.o. [name] Primary Care Provider: [name], [place] RY Other Clinician: Referring Provider: Treating Provider/Extender: [name], [place] RY Weeks in Treatment: 503 Constitutional Patient is hypertensive.. Pulse regular and within target range for patient.Marland Kitchen Respirations regular, non-labored and within target range.. Temperature is normal and within the target range for the patient.Marland Kitchen Appears in no distress. Notes Wound exam Midportion of the right tibia still exposed muscle Right lateral ankle small wound however it is open more than the last time I saw her. Right heel still necrotic base to this I have been unable to get anything consistent in terms of healthy tissue Left lateral malleolus again and nonviable base. Left anterior lower leg same nonviable adherent debris. Electronic Signature(s) Signed: 07/25/2020 6:00:25 PM By: Linton Ham MD Entered By: Linton Ham on 07/25/2020  13:33:23 -------------------------------------------------------------------------------- Physician Orders Details Patient Name: Date of Service: [name] LO [name], [name] 07/25/2020 11:00 A M Medical Record Number: [id number] Patient Account Number: [id number] Date of Birth/Sex: Treating RN: [birth year] (69 y.o. 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 - MD visit Nurse Visit: - next 2 weeks for rewrap Dressing Change Frequency Do not change entire dressing for one week. Skin Barriers/Peri-Wound Care Moisturizing lotion - both legs Wound Cleansing May shower with protection. Primary Wound Dressing Wound #16 Right Calcaneus Cutimed Sorbact Wound #19 Right,Lateral Malleolus Cutimed Sorbact Wound #20 Left,Lateral Malleolus Cutimed Sorbact Wound #21 Left,Lateral Lower Leg Cutimed Sorbact Wound #5 Right,Medial Lower Leg Cutimed Sorbact Secondary Dressing Wound #16 Right Calcaneus Dry Gauze Wound #19 Right,Lateral Malleolus Dry Gauze Wound #20 Left,Lateral Malleolus Dry Gauze Wound #21 Left,Lateral Lower Leg Dry Gauze Wound #5  Right,Medial Lower Leg Dry Gauze Edema Control 3 Layer Compression System - Bilateral Avoid standing for long periods of time Elevate legs to the level of the heart or above for 30 minutes daily and/or when sitting, a frequency of: - throughout the day Off-Loading Turn and reposition every 2 hours Other: - float heels off of bed/chair with pillow under calves Electronic Signature(s) Signed: 07/25/2020 6:00:25 PM By: Linton Ham MD Signed: 07/27/2020 6:13:34 PM By: Levan Hurst RN, BSN Entered By: Levan Hurst on 07/25/2020 12:11:50 -------------------------------------------------------------------------------- Problem List Details Patient Name: Date of Service: [name] LO [name], [name] 07/25/2020 11:00 A M Medical Record Number: [id number] Patient Account Number: [id number] Date of Birth/Sex: Treating RN: 1951/03/10 (69 y.o. 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 02/24/2015 No Yes I87.331 Chronic venous hypertension (idiopathic) with ulcer and inflammation of right 10/04/2016 No Yes lower extremity L89.616 Pressure-induced deep tissue damage of right heel 12/14/2019 No Yes L97.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 05/31/2020 No Yes L97.828 Non-pressure chronic ulcer of other part of left lower leg with other specified 06/14/2020 No Yes severity Inactive Problems ICD-10 Code Description Active Date Inactive Date 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 07/25/2020 11:00 A M Medical Record Number: [id number] Patient Account Number: [id number] Date of Birth/Sex: Treating RN: 04-12-1951 (69 y.o. [name] Primary Care Provider: [name], [place] RY Other Clinician: Referring Provider: Treating Provider/Extender: [name], MA RY Weeks in Treatment: 435 Subjective History of Present Illness (HPI) this is a patient who initially came to [place] for wounds on the medial malleoli bilaterally as well as her upper medial lower extremities bilaterally. These wounds eventually healed with assistance of Apligraf's bilaterally. While this was occurring she developed the current wound which opened into a fairly substantial wound on the right lower extremity. These are mostly secondary to venous stasis physiology however the patient also has underlying scleroderma, pulmonary hypertension. The wound has been making good progress lately with the Hydrofera Blue-based dressing. 03/17/2015; patient had a arterial  evaluation a year ago. Her right ABI was 0.86 left was 1.0. T brachial index was 0.41 on the right 0.45 on the left. Her oe bilateral common femoral artery waveforms were triphasic. Her white popliteal posterior tibial artery and anterior tibial artery waveforms were monophasic with good amplitude. Luteal artery waveforms were biphasic it was felt that her bilateral great toe pressures are of normal although adequate for tissue healing. 03/24/2015. The condition of this  wound is not really improved that. He was covered with as fibrinous surface slough and eschar. This underwent an aggressive debridement with both a curette and scalpel. I still cannot really get down to what I can would consider to be a viable surface. There is no evidence of infection. Previous workup for ischemia roughly a year ago was negative nevertheless I think that continues to be a concern 04/07/15. The patient arrived for application of second Apligraf. Once again the surface of this wound is certainly less viable than I would like for an advanced treatment option. An aggressive debridement was done. She developed some arteriolar bleeding which required that along pressure and silver alginate. 04/21/15: change in the condition of this wound. Once again it is covered in a gelatinous surface slough. After debridement today surface of the wound looks somewhat better but now a heeling surface 05/05/15 Apligraf #4 applied.Still a lot of slough on this wound. 05/19/15 Apligraf #5 applied. Still a lot of slough on this wound I did not aggressively remove this 06/02/15; continued copious amounts of surface slough. This debridement fairly easily. 06/08/2015 -- the last time she had a venous duplex study done was over 3 years ago and the surgery was prior to that. I have recommended that she sees Dr. early for a another opinion regarding a repeat venous duplex and possibly more endovenous ablation of vein stripping of micro-phlebotomies. 06/16/15; wound has a gelatinous surface eschar that the debridement fairly easily to a point. I don't disagree with the venous workup and perhaps even arterial re-evaluation. She is on prednisone 5 mg and continue his medications for her pulmonary artery hypertension I am not sure if the latter have any wound care healing issues I would need to investigate this. 06/23/15 continues with a gelatinous surface eschar with of fibrinous underlying. What I can see of this wound does  not look unhealthy however I just can't get this material which I think is 2 different layers off. Empiric culture done 06/30/15; unfortunately not a lot of change in this wound. A gelatinous surface eschar is easily removed however it has a tight fibrinous surface underneath the. 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 The wound comes back and basically and unchanged state the. She has a gelatinous surface that is more easily removed however there is a tightly fibrinous surface underneath the. There is no evidence of infection. She has a vascular follow-up next month. I would have to inject her in order to do a more aggressive debridement of this area 07/21/15 the wound is roughly in the same state albeit the debridement is done with greater ease. There is less of the fibrinous eschar underneath the. There is no evidence of infection. She has follow-up with vascular surgery next week. No evidence of surrounding infection. Her original distal wounds healed while this one formed. 07/28/15; wound is easier to debride. No wound erythema. She is seeing Dr. [name] next week. 08/18/15 Has been to [place] for repeat ablation. Have been using medihoney  pad with some improvement 08/25/15; absolutely no change in the condition of this wound in either its overall size or surface condition in many months now. At one point I had this down to [place] healthier surface I think with [facility] however this did not actually progress towards closure. Do not believe that the wound has actually deteriorated in terms of volume at all. We have been using a medihoney pad which allows easier debridement of the gelatinous eschar but again no overall actual improvement. the patient is going towards an ablation with pain and vascular which I think is scheduled for next week. The only other investigation that I could first see would be a biopsy. She does have underlying scleroderma 09/02/15 eschar is much  easier to debridement however the base of this does not look particularly vibrant. We changed to Iodoflex. The patient had her ablation earlier this week 09/09/15 again the debridement over the base of this wound is easier and the base of the wound looks considerably better. We will continue the Iodoflex. Dr. Tawni Millers has expressed his satisfaction with the result of her ablation 09/15/15 once again the wound is relatively free of surface eschar. No debridement was done today. It has a pale-looking base to it. although this is not as deep as it once was it seems to be expanding especially inferiorly. She has had recent venous ablation but this is no closer to healing.I've gone ahead and done a punch biopsy this from the inferior part of the wound close to normal skin 09/22/15: the wound is relatively free of surface eschar. There is some surrounding eschar. I'm not exactly sure at what level the surface is that I am seeing. 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 No evidence of atypical infection, malignancy. 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 No debridement was done 09/29/15; the wound surface is relatively free of surface eschar. There is eschar surrounding the walls of the wound. Aside from the improvement in the amount of surface slough. This wound has not progressed any towards closure. There is not even a surface that looks like there at this is ready. There is no evidence of any infection nor maligancy based on biopsy I did on 9/15. I continued with the Iodoflex however I am looking towards some alternative to try to promote some closure or filling in of this surface. Consider triple layer [place]. Collagen did not result in adequate control of the surface slough 10/13/15; the patient was in hospital last week with severe anemia. The wound looks somewhat better after debridement although there is widening medially. There is no evidence of  infection. 10/20/15; patient's wound on the right lateral lower leg is essentially unchanged. This underwent a light surface debridement and in general the debridement is easier and the surface looks improved. I noted in doing this on the side of the wound what appears to be a piece of calcium deposition. The patient noted that she had previous things on the tips of her fingers. 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 She follows with rheumatology and I have asked them to talk to her about this. In view of that S the nonhealing ulcer may have something to do with calcinosis and also unrelieved Raynaud's disease in this area. I should note that her original wounds on the right and left medial malleolus and the inner aspects of both legs just below the knees  did however heal over 10/30/15; the patient's wound on the right lateral lower leg is essentially unchanged. I was able to remove some calcified material from the medial wound edge. 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 Otherwise the wound appears essentially unchanged there is less adherent surface eschar. Some of the calcified material was sent to pathology for analysis 11/17/15. The patient's wound on the right lateral leg is essentially unchanged. Wider Medially. 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 There is some surface slough but I did not debridement this. No evidence of surrounding infection. 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 This will make healing difficult 12/15/15; the wound has less depth surface has a  fibrinous slough and calcifications in the wound edge no evidence of infection 12/22/15; the wound definitely has less Fibrinous slough on the base. Calcifications around the wound edges are still evident. Although the wound bed looks healthier it is still pale in appearance. Previous biopsy did not show malignancy 01/04/15; surgical debridement of nonviable slough and subcutaneous tissue the wound cleans up quite nicely but appears to be expanding outward calcifications around the wound edges are still there. 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 She is to see her rheumatologist I'll see if he has any opinions on this. My punch biopsy done in September did not show calcifications although these are clearly evident. 01/19/16 light selective debridement of nonviable surface slough. There is epithelialization medially. This gives me reason for cautious optimism. She has been to see her rheumatologist, there is nothing that can be done for this type of soft tissue calcification associated with scleroderma 02/02/16 no debridement although there is a light surface slough. She has 2 peninsulas of skin 1 inferiorly and one medially. We continue to make a slight and slow but definite progressive here 02/16/16; light surface debridement with more attention to the circumference of the wound bed where the fibrinous eschar is more prevalent. No calcifications detected. She seems to have done nicely with the [facility] with some epithelialization and some improvement in the overall wound volume. 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 Continued attention to the circumference of the wound where the fibrinous eschar in calcinosis or prevalent. No calcifications were detected. She has continued improvement with Hydrofera Blue. The wound is no  longer as deep 03/15/16 surgical debridement done to remove surface escha Especially around the circumference of the wound where there is nonviable subcutaneous tissue. In spite of this there is considerable improvement in the overall dimensions and depth of the wound. [place] of epithelialization are seen especially medially inferiorly and superiorly to a lesser extent. She is using Hydrofera Blue at home 03/29/16; surgical debridement done to remove surface eschar and nonviable subcutaneous tissue. This cleans up quite nicely mention slightly larger in terms of length and width however depth is less 04/12/16; continued gradual improvement in terms of depth and the condition of the wound base. Debridement is done. Continuing long standing Hydrofera Blue at home with Kerlix Coban wraps 04/26/16; continued gradual improvement in terms of depth and management as  well as condition of the wound base. Surgical debridement done she continues with Hydrofera Blue. 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 She initially came to this clinic venous insufficiency ulcers which have long since healed 05/17/16 continued improvement in terms of the depth and measurements of this wound although she has a tightly adherent fibrinous slough each time. We've been continued with long standing Hydrofera Blue which seems to done as well for this wound is many advanced treatment options. 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 She also has chronic venous insufficiency. She has an irritation on her lateral right ankle secondary to our wraps 05/10/16; wound appears to be smaller especially on the medial aspect and especially in the width. Wound was debridement surface looks better. She is also been in the hospital apparently with anemia again she tells me she had an endoscopy. Since she got home after 3 days which I believe was sometime last week she  has had an irritated painful area on the right lateral ankle surrounding the lateral malleolus 05/31/16; much more adherent surface slough today then recently although I don't think the dimensions of changed that much. A more aggressive debridement is required. The irritated area over her medial malleolus is more pruritic and painful and I don't think represents cellulitis 06/14/16; no major change in her wound dimensions however there is more tightly adherent surface slough which is disappointing. As well as she appears to have a new small area medially. Furthermore an irritated uncomfortable area on the lateral aspect of her right foot just below the lateral malleolus. 06/21/16; I'm seeing the patient back in follow-up for the new areas under her major wound on the right anterior leg. She has been using Hydrofera Blue to this area probably for several months now and although the dressings seem to be helping for quite a period of time I think things have stagnated lately. She comes in today with a relatively tight adherent surface slough and really no changes in the wound shape or dimension. The 2 small areas she had inferiorly are tiny but still open they seem improved this well. There is no uncontrolled edema and I don't think there is any evidence of cellulitis. 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 Patient has had vascular evaluations both venous and arterial. I have biopsied this area. There is no obvious infection. The worrisome thing today is that she seems to be developing areas of erythema and epithelial damage on the medial aspect of the right foot. Also to a lesser degree inferior to the actual wound itself. Again I see no obvious changes to suggest cellulitis however as this is the only treatable option I will probably give her antibiotics. 07/13/16 no major change in the lady's  large anterior right leg wound. Still covered with a very tightly adherent surface slough which is difficult to debridement. There is less erythema around this, culture last week grew pseudomonas I gave her ciprofloxacin. The area on her lateral right malleolus looks better- 08/02/16 the patient's wounds continued to decline. Her original large anterior right leg wound looks deeper. Still adherent surface slough that is difficult to debridement. She has a small area just below this and a punched-out wound just below her lateral malleolus. In the meantime she is been in hospital with apparently an upper GI bleed  on Plavix and aspirin. She is now just on Plavix she received 3 units of packed cells 08/23/16; since I last saw this 3 weeks ago, the open large area on her right leg looks about the same syrup. She has a small satellite lesion just underneath this. The area on her medial right ankle is now a deep necrotic wound. I attempted to debridement this however there is just too much pain. 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 She follows with vascular surgery and is scheduled for an angiogram in early September 09/06/16; the patient is going for an arteriogram tomorrow. Her original large wound on the right calf is about the same the satellite lesion underneath it is about the same however the area on her medial ankle is now deeper with exposed tendon. I am no longer attempting to debride these wounds 09/20/16; the patient has undergone a right femoral endarterectomy and Dacron patch angioplasty. This seems to have helped the flow in her right leg. 10/04/16; Arrived today for aggressive debridement of the wounds on her right calf the original wound the one beneath it and a difficult area over her right lateral leg just above the lateral malleolus. 10/25/16; her 3 open wounds are about the same in terms of dimensions however the  surface appears a lot healthier post debridement. Using Iodoflex 10/18/16 we have been using Iodoflex to her wounds which she tolerated with some difficulty. 10/11/16; has been using Santyl for a period of time with some improvement although again very adherent surface slough would prevent any attempted healing this. She has a original wound on the left calf, the satellite underneath that and the most recent wound on the right medial ankle. She has completed revascularization by Dr. early and has had venous ablation earlier. Want to go back to Iodoflex to see if week and get a healthier surface to this wound bed failing this I think she'll need to be taken to the OR and I am prepared to call Dr. Marla Roe to discuss this. She is obviously not a good candidate for general anesthesia however.; 11/08/16; I put her on Iodoflex last time to see if I can get the wound bed any healthier and unfortunately today still had tremendous surface slough. 11/15/16; 4 weeks' worth of Iodoflex with not much improvement. Debridement on the major wounds on her left anterior leg is easier however this does not maintain from week to week. The punched-out area on her medial right ankle 11/29/16; I attempted to change the patient last visit to [facility] however she states this burned and was very uncomfortable therefore we gave her permission to go back to the eye out of complex which she already had at home. Also she noted a lot of pain and swelling on the lateral aspect of her leg before she traveled to [facility] for the holidays, I called her in doxycycline over the phone. This seems to have helped 12/06/16; Wounds unchanged by in large. Using Iodoflex 12/13/16; her wounds today actually looks somewhat better. The area on the right lateral lower leg has reasonably healthy-looking granulation and perhaps as actually filled and a bit. Debridement of the 2 wounds on the medial calf is easier and post  debridement appears to have a healthier base. We have referred her to Dr. Migdalia Dk for consideration of operative debridement 12/20/16; we have a quick note from Dr. Merri Ray who feels that the patient  needs to be referred to an academic Manufacturing systems engineer. This is due to the complexity of the patient's medical issues as it applies to anything in the OR. We have been using Iodoflex 12/27/16; in general the wounds on the right leg are better in terms of the difficult to remove surface slough. She has been using Iodoflex. She is approved for Apligraf which I anticipated ordering in the next week or 2 when we get a better-looking surface 01/04/16 the deep wounds on the right leg generally look better. Both of them are debrided further surface slough. The area on the lateral right leg was not debrided. She is approved for Apligraf I think I'll probably order this either next week or the week after depending on the surface of the wounds superiorly. We have been using Iodoflex which will continue until then 01/11/16; the deep wounds on the right leg again have a surface slough that requires debridement. I've not been able to get the wound bed on either one of these wounds down to what would be acceptable for an advanced skin stab-like Apligraf. The area on the medial leg has been improving. We have been using hot Iodoflex to all wounds which seems to do the best at at least limiting the nonviable surface 01/24/17; we have continued Iodoflex and all her wound areas. Her debridement Gen. he is easier and the surface underneath this looks viable. Nevertheless these are large area wounds with exposed muscle at least on the anterior parts. We have ordered Apligraf's for 2 weeks from now. The patient will be away next week 02/07/17; the patient was close to have first Apligraf today however we did not order one. I therefore replaced her Iodoflex. She essentially has 3 large punched- out areas on her right  anterior leg and right medial ankle. 02/11/17; Apligraf #1 02/25/17; Apligraf #2. In general some improvement in the right medial ankle and right anterior leg wounds. The larger wound medially perhaps some better 03/11/17; Apligraf #3. In general continued improvement in the right medial ankle and the right anterior leg wounds. 03/25/17 Apligraf #4. In general continued improvement especially on the right medial ankle and the lower 04/08/17; Apligraf #5 in general continued improvement in all wound sites. 04/22/17; post Apligraf #5 her wound beds continued to look a lot better all of them up to the surface of the surrounding skin. Had a caramel-colored slough that I did not debrided in case there is residual Apligraf effect. The wounds are as good as I have seen these looking quite some time. 04/29/17; we applied [place] and last week after completing her Apligraf. Wounds look as though they've contracted somewhat although they have a nonviable surface which was problematic in the past. Apparently she has been approved for further Apligraf's. We applied Iodosorb today after debridement. 05/06/17; we're fortunate enough today to be able to apply additional Apligraf approved by her insurance. In general all of her wounds look better Apligraf #6 05/24/17; Apligraf #7 continued improvement in all wounds o3 06/10/17 Apligraf #8. Continued improvement in the surface of all wounds. Not much of an improvement in dimensions as I might a follow 06/24/17 Apligraf #9 continued general improvement although not as much change in the wound areas I might of like. She has a new open area on the right anterior lateral ankle very small and superficial. She also has a necrotic wound on the tip of her right index finger probably secondary to severe uncontrolled Raynaud's phenomenon. She is already on  sildenafil and already seen her rheumatologist who gave her Keflex. 07/08/17; Apligraf #10. Generalized improvement although she  has a small additional wound just medial to the major wound area. 07/22/17; after discussion we decided not to reorder any further Apligraf's although there is been considerable improvement with these it hasn't been recently. The major wound anteriorly looks better. Smaller wounds beneath this and the more recent one and laterally look about the same. The area on the right lateral lower leg looks about the same 07/29/17 this is a patient who is exceedingly complex. She has advanced scleroderma, crest syndrome including calcinosis, PAD status post revascularization, chronic venous insufficiency status post ablations. She initially presented to this clinic with wounds on her bilateral lower legs however these closed. More recently we have been dealing with a large open area superiorly on the right anterior leg, a smaller wound underneath this and then another one on the right medial lower extremity. These improve significantly with 10 Apligraf applications. Over the last 2-3 weeks we are making good progress with Hydrofera Blue and these seem to be making progress towards closure 08/19/17; wounds continued to make good improvement with Hydrofera Blue and episodic aggressive debridements 08/26/17; still using Hydrofera Blue. Good improvements 09/09/17; using Hydrofera Blue continued improvement. Area on the lateral part of her right leg has only a small remaining open area. The small inferior satellite region is for all intents and purposes closed. Her major wound also is come in in terms of depth and has advancing epithelialization. 09/16/17; using Hydrofera Blue with continued improvement. The smaller satellite wound. We've closed out today along with a new were satellite wound medially. The area on her medial ankle is still open and her major wound is still open but making improvement. All using Hydrofera Blue. Currently 09/30/17; using Hydrofera Blue. Still a small open area on the lateral right ankle area  and her original major wound seems to be making gradual and steady improvement. 10/14/17; still using Hydrofera Blue. Still too small open areas on the right lateral ankle. Her original major wound is horizontal and linear. The most problematic area paradoxically seems to be the area to the medial area wears I thought it would be the lateral. 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 10/28/17; still using Hydrofera Blue. Right lateral ankle has a very small open area superiorly on the most lateral part of the wound. Her original open wound has 2 open areas now separated by normal skin and we've redefined this. 11/11/17; still using Hydrofera Blue area and right lateral ankle continues to have a small open area on mostly the lateral part of the wound. Her original wound has 2 small open areas now separated by a considerable amount of normal skin 11/28/17; the patient called in slightly before Thanksgiving to report pain and erythema above the wound on the right leg. In the past this is responded well to treatment for cellulitis and I gave her over the phone doxycycline. She stated this resulted in fairly abrupt improvement. We have been using Hydrofera Blue for a prolonged period of time to the larger wound anteriorly into the remaining wound on her right right lateral ankle. The latter is just about closed with only a small linear area and the bottom of the [place]. 12/02/17; use endoform and left the dressing on since last visit. There is no tenderness and no evidence of infection. 12/16/17; patient has been using Endoform but not making much  progress. The 2 punched out open areas anteriorly which were the reminiscence of her major wound appear deeper. The area on the lateral aspect of her right calf also appears deeper. Also she has a puzzling tender swelling above her wound on the right leg. This seems larger than last time. Just above her wounds  there appears to be some fluctuance in this area it is not erythematous and there is no crepitus 12/30/17; patient has been using Endoform up until last week we used Hydrofera Blue. Ultrasound of the swelling above her 2 major wounds last time was negative for a fluid collection. I gave her cefaclor for the erythema and tenderness in this area which seems better. Unfortunately both punched out areas anteriorly and the area on her right medial lower leg appear deeper. In fact the lateral of the wounds anteriorly actually looks as though it has exposed tendon and/or muscle sheath. She is not systemically unwell. She is complaining of vaginitis type symptoms presumably Candida from her antibiotics. 01/06/18; we're using santyl. she has 2 punched out areas anteriorly which were initially part of a large wound. Unfortunately medially this is now open to tendon/muscle. All the wounds have the same adherent very difficult to debride surface. 01/20/18; 2 week follow-up using Santyl. She has the 2 punched out areas anteriorly which were initially part of her large surface wound there. Medially this still has exposed muscle. All of these have the same tightly adherent necrotic surface which requires debridement. PuraPly was not accepted by the patient's insurance however her insurance I think it changed therefore we are going to run Apligraf to gain 02/03/18; the patient has been using Santyl. The wound on the right lateral ankle looks improved and the 2 areas anteriorly on the right leg looks about the same. The medial one has exposed muscle. The lateral 1 requiresdebridement. We use PuraPly today for the first week 02/10/18; PuraPly #2. The patient has 3 wounds. The area on the right lateral ankle, 2 areas anteriorly that were part of her original large wound in this area the medial one has exposed muscle. All of the wounds were lightly debrided with a number 3 curet. PuraPly #2 applied the lateral wound on the calf  and the right lateral ankle look better. 02/17/18; PuraPly #3. Patient has 3 wounds. The area on the right lateral ankle in 2 areas internally that were part of her original large wound. The lateral area has exposed muscle. She arrived with some complaints of pain around the right ankle. 02/24/18; PuraPly #4; not much change in any of the 3 wound areas. Right lateral ankle, right lateral calf. Both of these required debridement with a #3 curet. She tolerates this marginally. The area on the medial leg still has exposed muscle. Not much change in dimensions 03/03/18 PuraPly #5. The area on the medial ankle actually looks better however the 2 separate areas that were original parts of the larger right anterior leg wound look as though they're attempting to coalesce. 03/10/18; PuraPly #6. The area on the medial ankle actually continues to look and measures smaller however the 2 separate areas that were part of the original large wound on the right anterior leg have now coalesced. There hasn't been much improvement here. The lateral area actually has underlying exposed muscle 03/17/18-she is here in follow-up evaluation for ulcerations to the right lower extremity. She is voicing no complaints or concerns. She tolerated debridement. Puraply#7 placed 03/24/18; difficult right lower extremity ulcerations. PuraPly #8 place.  She is been approved for [employer]. She did very well with Apligraf today however she is apparently reached her "lifetime max" 03/31/18; marginal improvement with PuraPly although her wounds looked as good as they have in several weeks today. Used TheraSkins #1 04/14/18 TheraSkin #2 today 04/28/18 TheraSkins #3. Wound slightly improved 05/12/18; TheraSkin skin #4. Wound response has been variable. 05/27/18 TheraSkin #5. Generally improvement in all wound areas. I've also put her in 3 layer compression to help with the severe venous hypertension 06/09/18; patient is done quite well with the  TheraSkins unfortunately we have no further applications. I also put her in 3 layer compression last week and that really seems to of helped. 06/16/18; we have been using silver collagen. Wounds are smaller. Still be open area to the muscle layer of her calf however even that is contracted somewhat. She tells me that at night sometimes she has pain on the right lateral calf at the site of her lower wound. Notable that I put her into 3 layer compression about 3 weeks ago. She states that she dangles her leg over the bed that makes it feel better but she does not describe claudication during the day ooShe is going to call her secondary insurance to see if they will continue to cover advanced treatment products I have reviewed her arterial studies from 01/22/17; this showed an ABI in the right of 1 and on the left noncompressible. TBI on the right at 0.30 on the left at 0.34. It is therefore possible she has significant PAD with medial calcification falsely elevating her ABI into the normal range. I'll need to be careful about asking her about this next week it's possible the 3 layer compression is too much 06/23/18; was able to reapply TheraSkin 1 today. Edema control is good and she is not complaining of pain no claudication 07/07/18;no major change. New wound which was apparently a taper removal injury today in our clinic between her 2 wounds on the right calf TheraSkins #2 07/14/18; I think there is some improvement in the right lateral ankle and the medial part of her wound. There is still exposed muscle medially. 07/28/18; two-week follow-up. TheraSkins#3. Unfortunately no major change. She is not a candidate I don't think for skin grafting due to severe venous hypertension associated with her scleroderma and pulmonary hypertension 08/11/18 Patient is here today for her [employer] application #09 (#5 of the second set). She seems to be doing well and in the base of the wound appears to show some progress  at this point. This is the last approved Theraskin of the second set. 08/25/18; she has completed TheraSkin. There has been some improvement on the right lateral calf wound as well as the anterior leg wounds. The open area to muscle medially on the anterior leg wound is smaller. I'm going to transition her back to [facility] under Kerlix Coban change every second day. She reports that she had some calcification removed from her right upper arm. We have had previous problems with calcifications in her wounds on her legs but that has not happened recently 09/08/18;using Hydrofera Blue on both her wound areas. Wounds seem to of contracted nicely. She uses Kerlix Coban wrap and changes at home herself 09/22/2018; using Hydrofera Blue on both her wound areas. Dimensions seem to have come down somewhat. There is certainly less depth in the medial part of the mid tibia wound and I do not think there is any exposed muscle at this point. 10/06/2018; 2-week follow-up.  Using [facility] on her wound areas. Dimensions have come down nicely both on the right lateral ankle area in the right mid tibial area. She has no new complaints 10/20/2018; 2-week follow-up. She is using Hydrofera Blue. Not much change from the last time she was here. The area on the lateral ankle has less depth although it has raised edges on one side. I attempted to remove as much of the raised edge as I could without creating more additional wounds. The area on the right anterior mid tibia area looks the same. 11/03/2018; 2-week follow-up she is using Hydrofera Blue. On the right anterior leg she now has 2 wounds separated by a large area of normal skin. The area on the medial part still has I think exposed muscle although this area itself is a lot smaller. The area laterally has some depth. Both areas with necrotic debris. ooThe area on her right lateral ankle has come in nicely 11/17/2018; patient continues to use Hydrofera Blue. We  have been increasing separation of the 2 wounds anteriorly which were at one point joint the area on the right lateral calf continues to have I think some improvement in depth. 12/08/2018; patient continues to use Hydrofera Blue. There is some improvement in the area on the right lateral calf. The 2 areas that were initially part of the original large wound in the mid right tibia are probably about the same. In fact the medial area is probably somewhat larger. We will run puraply through the patient's insurance 12/22/2018; she has been using Hydrofera Blue. We have small wounds on the right lateral calf and 2 small areas that were initially part of a large wound in the right mid tibia. We applied pure apply #1 today. 12/29/2018; we applied puraply #2. Her wounds look somewhat better especially on the right lateral calf and the lateral part of her original wound in the mid tibial area. 01/05/2019; perhaps slightly improved in terms of wound bed condition but certainly not as much improvement as I might of liked. Puraply #3 1/13: we did not have a correct sized puraply to apply. wounds more pinched out looking, I increased her compression to 3 layer last week to help with significant multilevel venous hypertension. Since then I've reviewed her arterial status. She has a right femoral endarterectomy and a distal left SFA stent. She was being followed by Dr. [name] for a period however she does not appear to have seen him in 3 years. I will set up an appointment. 1/20. The patient has an additional wound on the right lateral calf between the distal wound and proximal wounds. We did not have Puraply last week. Still does not have a follow-up with Dr. early 1/27: Follow-up with Dr. early has been arranged apparently with follow-up noninvasive studies. Wounds are measuring roughly the same although they certainly look smaller 2/3; the patient had non invasive studies. Her ABIs on the right were 0.83 and on the  left 1.02 however there was no great toe pressure bilaterally. Also worrisome monophasic waveforms at the PTA and dorsalis pedis. We are still using Puraply. We have had some improvement in all of the wounds especially the lateral part of the mid tibial area. 2/10; sees Dr. early of vein and vascular re-arterial studies next week. Puraply reapplied today. 2/17; Dr. early of vein and vascular his appointment is tomorrow. Puraply reapplied after debridement of all wounds 2/24; the patient saw Dr. early I reviewed his note. sHe noted the previous right  femoral endarterectomy with a Dacron patch. He also noted the normal ABI and the monophasic waveforms suggesting tibial disease. Overall he did not feel that she had any evidence of arterial insufficiency that would impair her wound healing. He did note her venous disease as well. He suggested PRN follow-up. 3/2; I had the last puraply applied today. The original wounds over the mid tibia area are improved where is the area on the right lower leg is not 3/9; wounds are smaller especially in the right mid tibia perhaps slightly in the right lateral calf. We finished with puraply and went to endoform today 3/23; the patient arrives after 2 weeks. She has been using endoform. I think all of her wounds look slightly better which includes the area on the right lateral calf just above the right lateral malleolus and the 2 in the right mid tibia which were initially part of the same wound. 4/27; TELEHEALTH visit; the patient was seen for telehealth visit today with her consent in the middle of the worldwide epidemic. Since she was last here she called in for antibiotics with pain and tenderness around the area on the right medial ankle. I gave her empiric doxycycline. She states this feels better. She is using endoform on both of these areas 5/11 TELEHEALTH; the patient was seen for telehealth visit today. She was accompanied at home by her husband. She has  severe pulmonary hypertension accompanied scleroderma and in the face of the Covid epidemic cannot be safely brought into our clinic. We have been using endoform on her wound areas. There are essentially 3 wound areas now in the left mid tibia now 2 open areas that it one point were connected and one on the right lateral ankle just above the malleolus. 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 She cannot come to clinic. The wounds were reviewed today via telehealth. She has severe chronic venous hypertension which I think is centrally mediated. She has wounds on her right anterior tibia and right lateral ankle area. These are chronic. She has been using endoform. 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 We have been following her from telehealth. She has severe chronic venous hypertension which may be mostly centrally mediated secondary to right heart heart failure. She has wounds on her right anterior tibia and right lateral ankle these are chronic we have been using endoform 6/22; TELEHEALTH; this patient was seen today via telehealth. 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 Since the last time we had contact with this patient she developed some pain and erythema around the wound on her right lateral malleolus/ankle and we put in antibiotics for her. This is resulted in good improvement with resolution of the erythema and tenderness. I changed her to silver alginate last time. We had been using endoform for an extended period of time 7/13; TELEHEALTH; this patient was seen today via telehealth. 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 She would continue to be at a prohibitive risk to be brought into our clinic unless this was absolutely necessary. These visits have been done with her approval as  well as her husband. We have been using silver alginate to the areas on the right mid tibia and right lateral lower leg. 7/27 TELEHEALTH; patient was seen along with her husband today via telehealth. 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 We changed her to sample last visit. She has 2 areas a chronic wound on her right mid tibia and one just above her ankle. These were not the original wounds when she came into this clinic but she developed them during treatment 8/17; she comes in for her first face-to-face visit in a long period. She has a remaining area just medial to the right tibia which is the last open part of her large wound across this area. She also has an area on the right lateral lower leg. We prescribed Santyl last telehealth visit but they were concerned that this was making a deeper so they put silver alginate on it last week. Her husband changes the dressings. 8/31; using Santyl to the 2 wound areas some improvement in wound surfaces. Husband has surgery in 2 weeks we will put her out 3 weeks. Any of the advanced treatment options that I can think of that would be eligible for this wound would also cause her to have to come in weekly. The risk that the patient is just too high 9/21. Using Santyl to the 2 wound areas. Both of these are somewhat better although the medial mid tibia area still has exposed muscle. Lateral ankle requiring debridement. Using Santyl 10/12; using Santyl to the 2 wound areas. One on the right lateral ankle and the other in the medial calf which still has exposed muscle. Both areas have come down in size and have better looking  surfaces. She has made nice progress with santyl 11/2; we have been using Santyl to the 2 wound areas. Right lateral ankle and the other in the mid tibia area the medial part of this still has exposed muscle. 11/23/2019 on evaluation today patient appears to be doing about the same really with regard to her wounds. She is actually not very pleased with how things seem to be progressing at this point she tells me that she really has not noted much improvement unfortunately. With that being said there is no signs of active infection at this time. There is some slough buildup noted at this time which again along with some dry skin around the edges of the wound I think would benefit her to try to debride some of this away. Fortunately her pain is doing fairly well. She still has exposed muscle in the right medial/tibial area. 12/14; TELEHEALTH; she was changed to [facility] to the right calf wound and right lateral ankle wound when she was here last time. Unfortunately since then she had a fall with a pelvic fracture and a fracture of her wrist. She was apparently hospitalized for 5 days. I have not looked at her discharge summary. She apparently came out of the hospital with a blister on her right heel. She was seen today via telehealth by myself and our case manager. The patient and her husband were present. She has been using Hydrofera Blue at our direction from the last time she was in the clinic. There is been no major improvement in fact the areas appear deeper and with a less viable surface 01/04/2020; TELEHEALTH; the patient was seen today in accompaniment of her husband and our nurse.  She has 3 open areas 1 on the right medial mid tibia, one on the right lateral ankle and a large eschared pressure area on the right heel. We have been using Iodoflex to the 2 original wounds. 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 It is simply too perilous for her to be seen in  any other way 1/26; TELEHEALTH; the patient was seen today in accompaniment of her husband and our RN one of our nurses. She still has the 3 open areas 1 on the right medial mid tibia which is the remanent of a more extensive wound in this area, one on the right lateral ankle and a large eschared pressure area on the right heel. We have been using Iodoflex to the 2 original wounds and a bed at 9 application to the eschared area on the heel 2/15; the first time we have seen this patient and then several months out of concern for the pandemic. She had a large horizontal wound in the mid tibia. Only the medial aspect of this is still open. Area on the lateral ankle is just about closed. She had a new pressure ulcer on the right heel which I have removed some of the eschar. We have been using Iodoflex which I will continue. The area in the mid tibia has a round circle in the middle of exposed muscle. I think we would have to use an advanced treatment product to stimulate granulation over this area. We will run this through her insurance. She is not eligible for plastic surgery for many reasons 3/1; TELEHEALTH. The patient was seen today by telehealth. 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 We have been using Iodoflex to the wound areas which include the right anterior mid tibia, right lateral ankle and the right heel. All of these were reviewed 3/8; the patient's wound just above the right ankle is closed. She still has a contracting black eschar on the heel where she had a pressure ulcer. The medial part of her original wound on the mid tibia has exposed muscle. We have made really made no progress in this area although we have managed to get a lot of the original wound in this area to close. We used Apligraf #1 today 3/22; the patient's ankle wound remains closed. She still has a contracting black eschar  on the heel although it seems to have less surface area. It still not clear whether there is any depth here. We used Apligraf #2 today in an attempt to get granulation over the exposed muscle and what is left of her mid tibia area. 4/5; everything is closed except the medial aspect of the mid tibia wound, as well as the pressure injury on the right heel. She has been using Betadine to the right heel which has been gradually contracting. We used Apligraf #3 today. 4/19; Apligraf #4. Still a pressure area on the right heel she has been using Betadine superficial excoriated areas she has been applying salicylic acid to on the anterior leg and the thick horn on the leg just above her wound area 5/3; Apligraf #5. Unfortunately she came in today with a reopening of the area on the right lateral lower leg. Not much change in the wound we have been treating in the mid calf. Quite a bit of edema surrounding this wound. She reports that she was up on her feet quite a bit.  The area on the right heel is separating she has been using Betadine She comes in with a new wound on the left lateral malleolus which is very disappointing this is been open since last week 5/17; we applied her last Apligraf last time. Unfortunately that did not have any effect on the deep area medially in the mid tibial area. However the area over the right lateral malleolus is a lot better. Right heel also is contracted we used Iodoflex last time. The new wound on the left lateral malleolus were using Santyl to. This required debridement. 6/1. Not much change in the area on the right medial mid tibia. Right lateral ankle is improved she has the necrotic area on the right heel which was a pressure area. New area on the left lateral malleolus from last time. I changed her to Iodoflex to the area on the left lateral malleolus she said this hurt I have put her back on Santyl 6/15; right mid tibia is unchanged. I do not think there is  anything I can do to this topically that we will get this to granulate over the muscle. And I am furthermore I am not sure that she is a surgical candidate either because of her severe pulmonary issues or because she really does not want to go through it. ooThe area on the right lateral malleolus is a small punched out area. ooThe area on the left lateral malleolus again small punched out with nonviable surface ooPressure ulcer on the right heel at separating black eschar. I went ahead and remove this today [name] [name] has an area on the left anterior mid tibia just laterally. Geographic wounds debris on the surface. Some erythema here. 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 There is skin changes widely Change the primary dressing to Sorbact to all wound areas. She needs compression on the left leg as well as the right 6/21; right mid tibia which was her one remaining wound at 1 point is unchanged ooThe area on the right lateral malleolus actually is close to closing over still a small punched out area ooThe area on the left lateral malleolus again a deep wound it is this 1 that she feels pain in. ooPressure ulcer on the right heel was cultured today. ooNew area on the left anterior mid tibia several geographic wounds last week in the setting of chronic venous insufficiency this actually looks some better 7/6; ooRight mid tibia about the same. Exposed muscle ooLeft lateral malleolus again necrotic debris over the surface. ooPressure ulcer on the right heel. She finished the Keflex we prescribed. Necrotic debris debrided with a #3 curette ooThe rest of her wounds on the right mid tibia right lateral ankle are better Her husband reminds me that she had a right femoral endarterectomy and has 2 stents in her left thigh placed in 2011 approximately by vascular surgery in [place]. She saw Dr. [name] last in February 2020. At that point he did not think that the arterial  insufficiency she had on the right was sufficient to explain any of her right lower extremity wounds however she now has an area on the left lateral malleolus that looks like an ischemic wound 7/12; we have been using Sorbact all wound areas ooRight mid tibia about the same exposed muscle ooLeft lateral malleolus small wound with debris in the surface punched out ooPressure ulcer of the right heel requiring debridement of necrotic debris ooLateral ankle wound on the right is just about closed ooRight mid  tibia wound about the same still with exposed muscle. 7/26 really no change in any wounds. We have been using Sorbact. She has bilateral lower extremity wounds. This is in the setting of scleroderma, severe venous hypertension. She also has known PAD and I had really hoped to be able to get a review by Dr. [name] but apparently that is not booked until August 31. I have asked her to call back to vein and vascular and get an appointment with somebody a little earlier if that is possible. Objective Constitutional Patient is hypertensive.. Pulse regular and within target range for patient.Marland Kitchen Respirations regular, non-labored and within target range.. Temperature is normal and within the target range for the patient.Marland Kitchen Appears in no distress. Vitals Time Taken: 11:30 AM, Height: 68 in, Weight: 132 lbs, BMI: 20.1, Temperature: 98.7 F, Pulse: 75 bpm, Respiratory Rate: 19 breaths/min, Blood Pressure: 156/69 mmHg. General Notes: Wound exam ooMidportion of the right tibia still exposed muscle ooRight lateral ankle small wound however it is open more than the last time I saw her. ooRight heel still necrotic base to this I have been unable to get anything consistent in terms of healthy tissue ooLeft lateral malleolus again and nonviable base. Left anterior lower leg same nonviable adherent debris. Integumentary (Hair, Skin) Wound #16 status is Open. Original cause of wound was Pressure Injury. The  wound is located on the Right Calcaneus. The wound measures 0.9cm length x 1.5cm width x 1cm depth; 1.06cm^2 area and 1.06cm^3 volume. There is Fat Layer (Subcutaneous Tissue) Exposed exposed. There is no tunneling or undermining noted. There is a small amount of purulent drainage noted. The wound margin is well defined and not attached to the wound base. There is no granulation within the wound bed. There is a large (67-100%) amount of necrotic tissue within the wound bed including Adherent Slough. Wound #19 status is Open. Original cause of wound was Gradually Appeared. The wound is located on the Right,Lateral Malleolus. The wound measures 0.6cm length x 0.4cm width x 0.5cm depth; 0.188cm^2 area and 0.094cm^3 volume. There is Fat Layer (Subcutaneous Tissue) Exposed exposed. There is no tunneling or undermining noted. There is a small amount of serous drainage noted. The wound margin is well defined and not attached to the wound base. There is medium (34-66%) pink granulation within the wound bed. There is a medium (34-66%) amount of necrotic tissue within the wound bed including Adherent Slough. Wound #20 status is Open. Original cause of wound was Gradually Appeared. The wound is located on the Left,Lateral Malleolus. The wound measures 0.8cm length x 0.7cm width x 0.6cm depth; 0.44cm^2 area and 0.264cm^3 volume. There is Fat Layer (Subcutaneous Tissue) Exposed exposed. There is no tunneling noted, however, there is undermining starting at 9:00 and ending at 4:00 with a maximum distance of 0.4cm. There is a small amount of purulent drainage noted. The wound margin is well defined and not attached to the wound base. There is no granulation within the wound bed. There is a large (67-100%) amount of necrotic tissue within the wound bed including Adherent Slough. Wound #21 status is Open. Original cause of wound was Gradually Appeared. The wound is located on the Left,Lateral Lower Leg. The wound  measures 0.7cm length x 0.5cm width x 0.3cm depth; 0.275cm^2 area and 0.082cm^3 volume. There is Fat Layer (Subcutaneous Tissue) Exposed exposed. There is no tunneling or undermining noted. There is a small amount of serous drainage noted. The wound margin is flat and intact. There is small (  1-33%) pink granulation within the wound bed. There is a large (67-100%) amount of necrotic tissue within the wound bed including Adherent Slough. Wound #22 status is Healed - Epithelialized. Original cause of wound was Gradually Appeared. The wound is located on the Left,Distal,Lateral Lower Leg. The wound measures 0cm length x 0cm width x 0cm depth; 0cm^2 area and 0cm^3 volume. There is no tunneling or undermining noted. There is a none present amount of drainage noted. The wound margin is distinct with the outline attached to the wound base. There is no granulation within the wound bed. There is no necrotic tissue within the wound bed. Wound #5 status is Open. Original cause of wound was Gradually Appeared. The wound is located on the Right,Medial Lower Leg. The wound measures 2.1cm length x 2.1cm width x 0.9cm depth; 3.464cm^2 area and 3.117cm^3 volume. There is Fat Layer (Subcutaneous Tissue) Exposed exposed. There is no tunneling or undermining noted. There is a medium amount of purulent drainage noted. The wound margin is distinct with the outline attached to the wound base. There is small (1-33%) pink granulation within the wound bed. There is a large (67-100%) amount of necrotic tissue within the wound bed including Adherent Slough. 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 There was a Three Layer Compression Therapy Procedure by Levan Hurst, RN. Post procedure Diagnosis Wound #16: Same as Pre-Procedure Wound #19 Pre-procedure diagnosis of Wound #19 is a Venous Leg Ulcer located on the Right,Lateral Malleolus . There was a Three Layer Compression Therapy Procedure by Levan Hurst, RN. Post procedure Diagnosis Wound #19: Same as Pre-Procedure Wound #20 Pre-procedure diagnosis of Wound #20 is an Arterial Insufficiency Ulcer located on the Left,Lateral Malleolus . There was a Three Layer Compression Therapy Procedure by Levan Hurst, RN. Post procedure Diagnosis Wound #20: Same as Pre-Procedure Wound #21 Pre-procedure diagnosis of Wound #21 is a Venous Leg Ulcer located on the Left,Lateral Lower Leg . There was a Three Layer Compression Therapy Procedure by Levan Hurst, RN. Post procedure Diagnosis Wound #21: Same as Pre-Procedure Wound #5 Pre-procedure diagnosis of Wound #5 is a Venous Leg Ulcer located on the Right,Medial Lower Leg . There was a Three Layer Compression Therapy Procedure by Levan Hurst, RN. Post procedure Diagnosis Wound #5: Same as Pre-Procedure Plan Follow-up Appointments: Return appointment in 3 weeks. - MD visit Nurse Visit: - next 2 weeks for rewrap Dressing Change Frequency: Do not change entire dressing for one week. Skin Barriers/Peri-Wound Care: Moisturizing lotion - both legs Wound Cleansing: May shower with protection. Primary Wound Dressing: Wound #16 Right Calcaneus: Cutimed Sorbact Wound #19 Right,Lateral Malleolus: Cutimed Sorbact Wound #20 Left,Lateral Malleolus: Cutimed  Sorbact Wound #21 Left,Lateral Lower Leg: Cutimed Sorbact Wound #5 Right,Medial Lower Leg: Cutimed Sorbact Secondary Dressing: Wound #16 Right Calcaneus: Dry Gauze Wound #19 Right,Lateral Malleolus: Dry Gauze Wound #20 Left,Lateral Malleolus: Dry Gauze Wound #21 Left,Lateral Lower Leg: Dry Gauze Wound #5 Right,Medial Lower Leg: Dry Gauze  Edema Control: 3 Layer Compression System - Bilateral Avoid standing for long periods of time Elevate legs to the level of the heart or above for 30 minutes daily and/or when sitting, a frequency of: - throughout the day Off-Loading: Turn and reposition every 2 hours Other: - float heels off of bed/chair with pillow under calves #1 I am continuing with the Sorbact under 3 layer compression 2. She has chronic venous hypertension, pulmonary hypertension, as well as scleroderma. However she also has stents in her bilateral lower legs and I am going to try to get her looked at specifically to make sure that we have adequate blood flow. 3. We were originally dealing with the area on the right anterior mid tibia as well as the right lateral ankle the other wounds were areas that have come up in the last several months including a pressure ulcer on her right heel. Electronic Signature(s) Signed: 07/25/2020 6:00:25 PM By: Linton Ham MD Entered By: Linton Ham on 07/25/2020 13:35:13 -------------------------------------------------------------------------------- SuperBill Details Patient Name: Date of Service: [name] LO [name], [name] [name] 07/25/2020 Medical Record Number: [id number] Patient Account Number: [id number] Date of Birth/Sex: Treating RN: September 09, 1951 (69 y.o. 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 Modifier: Quantity: 1 Physician Procedures : CPT4 Code Description Modifier [id number] - WC PHYS LEVEL 3 - EST PT ICD-10 Diagnosis Description L97.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 L97.811 Non-pressure chronic ulcer of other part of right lower leg limited to  breakdown of skin L97.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 I87.331 Chronic venous hypertension (idiopathic) with ulcer and inflammation of right lower extremity Quantity: 1 Electronic Signature(s) Signed: 07/25/2020 6:00:25 PM By: Linton Ham MD Signed: 07/27/2020 6:13:34 PM By: Levan Hurst RN, BSN Entered By: Levan Hurst on 07/25/2020 13:51:05

## 2020-07-27 NOTE — Progress Notes (Signed)
DMIYAH, LISCANO ([id number]) Visit Report for 07/25/2020 Arrival Information Details Patient Name: Date of Service: [name] 07/25/2020 11:00 A M Medical Record Number: [id number] Patient Account Number: [id number] Date of Birth/Sex: Treating RN: 1951/06/20 (69 y.o. [name] Primary Care Derald Lorge: [name], [place] R[place] Other Clinician: Referring Jla Reynolds: Treating Keara Pagliarulo/Extender: [name], MA RY Weeks in Treatment: 354 Visit Information History Since Last Visit Added or deleted any medications: No Patient Arrived: Ambulatory Any new allergies or adverse reactions: No Arrival Time: 11:30 Had a fall or experienced change in No Accompanied By: husband activities of daily living that may affect Transfer Assistance: None risk of falls: Patient Identification Verified: Yes Signs or symptoms of abuse/neglect since last visito No Secondary Verification Process Completed: Yes Hospitalized since last visit: No Patient Requires Transmission-Based Precautions: No Implantable device outside of the clinic excluding No Patient Has Alerts: No cellular tissue based products placed in the center since last visit: Has Dressing in Place as Prescribed: Yes Has Compression in Place as Prescribed: Yes Pain Present Now: No Electronic Signature(s) Signed: 07/25/2020 6:11:36 PM By: Kela Millin Entered By: Kela Millin on 07/25/2020 11:31:12 -------------------------------------------------------------------------------- Compression Therapy Details Patient Name: Date of Service: [name] 07/25/2020 11:00 A M Medical Record Number: [id number] Patient Account Number: [id number] Date of Birth/Sex: Treating RN: April 29, 1951 (69 y.o. [name] Primary Care Draeden Kellman: [name], [place] R[place] Other Clinician: Referring Lockie Bothun: Treating Susa Bones/Extender: [name], MA RY Weeks in Treatment: 435 Compression Therapy Performed for Wound  Assessment: Wound #16 Right Calcaneus Performed By: Clinician Levan Hurst, RN Compression Type: Three Layer Post Procedure Diagnosis Same as Pre-procedure Electronic Signature(s) Signed: 07/27/2020 6:13:34 PM By: Levan Hurst RN, BSN Entered By: Levan Hurst on 07/25/2020 12:08:21 -------------------------------------------------------------------------------- Compression Therapy Details Patient Name: Date of Service: [name], [name] 07/25/2020 11:00 A M Medical Record Number: [id number] Patient Account Number: [id number] Date of Birth/Sex: Treating RN: 08/03/1951 (69 y.o. [name] Primary Care Adhrit Krenz: [name], [place] R[place] Other Clinician: Referring Camil Hausmann: Treating Yanci Bachtell/Extender: [name], MA RY Weeks in Treatment: 435 Compression Therapy Performed for Wound Assessment: Wound #19 Right,Lateral Malleolus Performed By: Clinician Levan Hurst, RN Compression Type: Three Layer Post Procedure Diagnosis Same as Pre-procedure Electronic Signature(s) Signed: 07/27/2020 6:13:34 PM By: Levan Hurst RN, BSN Entered By: Levan Hurst on 07/25/2020 12:08:21 -------------------------------------------------------------------------------- Compression Therapy Details Patient Name: Date of Service: [name], [name] 07/25/2020 11:00 A M Medical Record Number: [id number] Patient Account Number: [id number] Date of Birth/Sex: Treating RN: 1951-02-13 (69 y.o. [name] Primary Care Daveah Varone: [name], [place] Other Clinician: Referring Lorrayne Ismael: Treating Syair Fricker/Extender: [name], MA RY Weeks in Treatment: 435 Compression Therapy Performed for Wound Assessment: Wound #20 Left,Lateral Malleolus Performed By: Clinician Levan Hurst, RN Compression Type: Three Layer Post Procedure Diagnosis Same as Pre-procedure Electronic Signature(s) Signed: 07/27/2020 6:13:34 PM By: Levan Hurst RN, BSN Entered By: Levan Hurst on  07/25/2020 12:08:21 -------------------------------------------------------------------------------- Compression Therapy Details Patient Name: Date of Service: [name], [name] 07/25/2020 11:00 A M Medical Record Number: [id number] Patient Account Number: [id number] Date of Birth/Sex: Treating RN: 04-02-1951 (69 y.o. [name] Primary Care Jolayne Branson: [name], [place] Other Clinician: Referring Cataleyah Colborn: Treating Audyn Dimercurio/Extender: [name], MA RY Weeks in Treatment: 435 Compression Therapy Performed for Wound Assessment: Wound #21 Left,Lateral Lower Leg Performed By: Cristino Martes,  Briant Cedar, RN Compression Type: Three Layer Post Procedure Diagnosis Same as Pre-procedure Electronic Signature(s) Signed: 07/27/2020 6:13:34 PM By: Levan Hurst RN, BSN Entered By: Levan Hurst on 07/25/2020 12:08:22 -------------------------------------------------------------------------------- Compression Therapy Details Patient Name: Date of Service: [name], [name] 07/25/2020 11:00 A M Medical Record Number: [id number] Patient Account Number: [id number] Date of Birth/Sex: Treating RN: [birth year] (69 y.o. [name] Primary Care Shell Yandow: [name], [place] Other Clinician: Referring Jeannene Tschetter: Treating Novalee Horsfall/Extender: [name], MA RY Weeks in Treatment: 435 Compression Therapy Performed for Wound Assessment: Wound #5 Right,Medial Lower Leg Performed By: Clinician Levan Hurst, RN Compression Type: Three Layer Post Procedure Diagnosis Same as Pre-procedure Electronic Signature(s) Signed: 07/27/2020 6:13:34 PM By: Levan Hurst RN, BSN Entered By: Levan Hurst on 07/25/2020 12:08:22 -------------------------------------------------------------------------------- Encounter Discharge Information Details Patient Name: Date of Service: [name], [name] 07/25/2020 11:00 A M Medical Record Number: [id number] Patient Account Number:  [id number] Date of Birth/Sex: Treating RN: 02/15/1951 (69 y.o. [name] Primary Care Sinaya Minogue: [name], [place] R[place] Other Clinician: Referring Lance Huaracha: Treating Tayna Smethurst/Extender: [name], MA RY Weeks in Treatment: 324 Encounter Discharge Information Items Discharge Condition: Stable Ambulatory Status: Ambulatory Discharge Destination: Home Transportation: Private Auto Accompanied By: husband Schedule Follow-up Appointment: Yes Clinical Summary of Care: Patient Declined Electronic Signature(s) Signed: 07/25/2020 6:11:36 PM By: Kela Millin Entered By: Kela Millin on 07/25/2020 12:30:13 -------------------------------------------------------------------------------- Lower Extremity Assessment Details Patient Name: Date of Service: [name] 07/25/2020 11:00 A M Medical Record Number: [id number] Patient Account Number: [id number] Date of Birth/Sex: Treating RN: 05/30/1951 (69 y.o. [name] Primary Care Natayah Warmack: [name], [place] Other Clinician: Referring Jeziel Hoffmann: Treating Giancarlos Berendt/Extender: [name], MA RY Weeks in Treatment: 435 Edema Assessment Assessed: [Left: No] [Right: No] Edema: [Left: Yes] [Right: Yes] Calf Left: Right: Point of Measurement: 36 cm From Medial Instep 34.5 cm 29 cm Ankle Left: Right: Point of Measurement: 10 cm From Medial Instep 21.5 cm 21.5 cm Vascular Assessment Pulses: Dorsalis Pedis Palpable: [Left:Yes] [Right:Yes] Electronic Signature(s) Signed: 07/25/2020 6:11:36 PM By: Kela Millin Entered By: Kela Millin on 07/25/2020 11:35:10 -------------------------------------------------------------------------------- Multi Wound Chart Details Patient Name: Date of Service: [name] 07/25/2020 11:00 A M Medical Record Number: [id number] Patient Account Number: [id number] Date of Birth/Sex: Treating RN: 09-Jul-1951 (69 y.o. 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] Primary Etiology: [16:Anemia, Hypertension, Peripheral] [19:Anemia, Hypertension, Peripheral] [20:Anemia, Hypertension, Peripheral] Comorbid History: [16:Arterial Disease, Peripheral Venous Disease, Raynauds, Scleroderma, Rheumatoid Arthritis, Osteoarthritis 01/04/2020] [19:Arterial Disease, Peripheral Venous Disease, Raynauds, Scleroderma, Rheumatoid Arthritis, Osteoarthritis  05/02/2020] [20:Arterial Disease, Peripheral Venous Disease, Raynauds, Scleroderma, Rheumatoid Arthritis, Osteoarthritis 05/02/2020] Date Acquired: [16:29] [19:12] [20:12] Weeks of Treatment: [16:Open] [19:Open] [20:Open] Wound Status: [16:No] [19:No] [20:No] Clustered Wound: [16:0.9x1.5x1] [19:0.6x0.4x0.5] [20:0.8x0.7x0.6] Measurements L x W x D (cm) [16:1.06] [19:0.188] [20:0.44] A (cm) : rea [16:1.06] [19:0.094] [20:0.264] Volume (cm) : [16:91.60%] [19:-33.30%] [20:0.00%] % Reduction in Area: [16:15.70%] [19:-235.70%] [20:-200.00%] % Reduction in Volume: [20:9] Starting Position 1 (o'clock): [20:4]  Ending Position 1 (o'clock): [20:0.4] Maximum Distance 1 (cm): [16:No] [19:No] [20:Yes] Undermining: [16:Unstageable/Unclassified] [19:Full Thickness Without Exposed] [20:Full Thickness Without Exposed] Classification: [16:Small] [19:Support Structures Small] [20:Support Structures Small] Exudate Amount:  [16:Purulent] [19:Serous] [20:Purulent] Exudate Type: [16:yellow, brown, green] [19:amber] [20:yellow, brown, green] Exudate Color: [16:Well defined, not attached] [19:Well defined, not attached] [20:Well defined, not attached] Wound Margin: [16:None Present (0%)] [19:Medium (34-66%)] [20:None Present (0%)] Granulation Amount: [16:N/A] [19:Pink] [20:N/A] Granulation Quality: [16:Large (67-100%)] [19:Medium (34-66%)] [20:Large (67-100%)] Necrotic Amount: [16:Fat Layer (Subcutaneous Tissue)] [19:Fat Layer (Subcutaneous Tissue)] [20:Fat Layer (Subcutaneous Tissue)] Exposed Structures: [16:Exposed: Yes Fascia: No Tendon: No Muscle: No Joint: No Bone: No None] [19:Exposed: Yes Fascia: No Tendon: No Muscle: No Joint: No Bone: No Large (67-100%)] [20:Exposed: Yes Fascia: No Tendon: No Muscle: No Joint: No Bone: No None] Epithelialization: [16:Compression Therapy] [19:Compression Therapy] [20:Compression Therapy] Wound Number: _0 Photos: No Photos No Photos No Photos Left, Lateral Lower Leg Left, Distal, Lateral Lower Leg Right, Medial Lower Leg Wound Location: Gradually Appeared Gradually Appeared Gradually Appeared 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 Primary Etiology: Anemia, Hypertension, Peripheral Anemia, Hypertension, Peripheral Anemia, Hypertension, Peripheral Comorbid History: Arterial Disease, Peripheral Venous Arterial Disease, Peripheral Venous Arterial Disease, Peripheral Venous Disease, Raynauds, Scleroderma, Disease, Raynauds, Scleroderma, Disease, Raynauds, Scleroderma, Rheumatoid Arthritis, Osteoarthritis Rheumatoid Arthritis, Osteoarthritis Rheumatoid Arthritis, Osteoarthritis 06/14/2020 06/20/2020 01/13/2013 Date Acquired: 5 5 391 Weeks of Treatment: Open Healed - Epithelialized Open Wound Status: Yes No Yes Clustered Wound: 0.7x0.5x0.3 0x0x0 2.1x2.1x0.9 Measurements L x W x D (cm) 0.275 0 3.464 A (cm) : rea 0.082 0 3.117 Volume (cm)  : 84.40% 100.00% -145.00% % Reduction in Area: 53.70% 100.00% -2110.60% % Reduction in Volume: No No No Undermining: Full Thickness Without Exposed Full Thickness Without Exposed Full Thickness With Exposed Support Classification: Support Structures Support Structures Structures Small None Present Medium Exudate Amount: Serous N/A Purulent Exudate Type: amber N/A yellow, brown, green Exudate Color: Flat and Intact Distinct, outline attached Distinct, outline attached Wound Margin: Small (1-33%) None Present (0%) Small (1-33%) Granulation Amount: Pink N/A Pink Granulation Quality: Large (67-100%) None Present (0%) Large (67-100%) Necrotic Amount: Fat Layer (Subcutaneous Tissue) Fascia: No Fat Layer (Subcutaneous Tissue) Exposed Structures: Exposed: Yes Fat Layer (Subcutaneous Tissue) Exposed: Yes Fascia: No Exposed: No Fascia: No Tendon: No Tendon: No Tendon: No Muscle: No Muscle: No Muscle: No Joint: No Joint: No Joint: No Bone: No Bone: No Bone: No Large (67-100%) None Small (1-33%) Epithelialization: Compression Therapy N/A Compression Therapy Procedures Performed: Treatment Notes Wound #16 (Right Calcaneus) 1. Cleanse With Wound Cleanser Soap and water 2. Periwound Care Moisturizing lotion 3. Primary Dressing Applied Other primary dressing (specifiy in notes) 4. Secondary Dressing Dry Gauze 6. Support Layer Applied 3 layer compression wrap Notes sorbact Wound #19 (Right, Lateral Malleolus) 1. Cleanse With Wound Cleanser Soap and water 2. Periwound Care Moisturizing lotion 3. Primary Dressing Applied Other primary dressing (specifiy in notes) 4. Secondary Dressing Dry Gauze 6. Support Layer Applied 3 layer compression wrap Notes sorbact Wound #20 (Left, Lateral Malleolus) 1. Cleanse With Wound Cleanser Soap and water 2. Periwound Care Moisturizing lotion 3. Primary Dressing Applied Other primary dressing (specifiy in notes) 4.  Secondary Dressing Dry Gauze 6. Support Layer Applied 3 layer compression wrap Notes sorbact Wound #21 (Left, Lateral Lower Leg) 1. Cleanse With Wound Cleanser Soap and water 2. Periwound Care Moisturizing lotion 3. Primary Dressing Applied Other primary dressing (specifiy in notes) 4. Secondary Dressing Dry Gauze 6. Support Layer [employer]  3 layer compression wrap Notes sorbact Wound #5 (Right, Medial Lower Leg) 1. Cleanse With Wound Cleanser Soap and water 2. Periwound Care Moisturizing lotion 3. Primary Dressing Applied Other primary dressing (specifiy in notes) 4. Secondary Dressing Dry Gauze 6. Support Layer Applied 3 layer compression wrap Notes Wellsite geologist) Signed: 07/25/2020 6:00:25 PM By: Linton Ham MD Signed: 07/27/2020 6:13:34 PM By: Levan Hurst RN, BSN Entered By: Linton Ham on 07/25/2020 13:30:45 -------------------------------------------------------------------------------- Multi-Disciplinary Care Plan Details Patient Name: Date of Service: [name], [name] 07/25/2020 11:00 A M Medical Record Number: [id number] Patient Account Number: [id number] Date of Birth/Sex: Treating RN: 08-02-1951 (69 y.o. [name] Primary Care Omarrion Carmer: [name], [place] R[place] Other Clinician: Referring Niley Helbig: Treating Aubrina Nieman/Extender: [name], MA RY Weeks in Treatment: 435 Active Inactive Wound/Skin Impairment Nursing Diagnoses: Impaired tissue integrity Knowledge deficit related to ulceration/compromised skin integrity Goals: Patient/caregiver will verbalize understanding of skin care regimen Date Initiated: 10/11/2016 Target Resolution Date: 08/05/2020 Goal Status: Active Interventions: Assess patient/caregiver ability to obtain necessary supplies Assess patient/caregiver ability to perform ulcer/skin care regimen upon admission and as needed Assess ulceration(s) every visit Provide education on ulcer and skin  care Treatment Activities: Skin care regimen initiated : 10/11/2016 Topical wound management initiated : 10/11/2016 Notes: Electronic Signature(s) Signed: 07/27/2020 6:13:34 PM By: Levan Hurst RN, BSN Entered By: Levan Hurst on 07/25/2020 12:05:52 -------------------------------------------------------------------------------- Pain Assessment Details Patient Name: Date of Service: [name] 07/25/2020 11:00 A M Medical Record Number: [id number] Patient Account Number: [id number] Date of Birth/Sex: Treating RN: 1951-06-16 (69 y.o. [name] Primary Care Cadence Minton: [name], [place] R[place] Other Clinician: Referring Azelyn Batie: Treating Genni Buske/Extender: [name], MA RY Weeks in Treatment: 709 Active Problems Location of Pain Severity and Description of Pain Patient Has Paino No Site Locations Pain Management and Medication Current Pain Management: Electronic Signature(s) Signed: 07/25/2020 6:11:36 PM By: Kela Millin Entered By: Kela Millin on 07/25/2020 11:31:40 -------------------------------------------------------------------------------- Patient/Caregiver Education Details Patient Name: Date of Service: [name], JO A N F. 7/26/2021andnbsp11:00 A M Medical Record Number: [id number] Patient Account Number: [id number] Date of Birth/Gender: Treating RN: 02/11/1951 (69 y.o. [name] Primary Care Physician: [name], [place] R[place] Other Clinician: Referring Physician: Treating Physician/Extender: [name], MA RY Weeks in Treatment: [phone] Education Assessment Education Provided To: Patient Education Topics Provided Wound/Skin Impairment: Methods: Explain/Verbal Responses: State content correctly Electronic Signature(s) Signed: 07/27/2020 6:13:34 PM By: Levan Hurst RN, BSN Entered By: Levan Hurst on 07/25/2020 12:07:52 -------------------------------------------------------------------------------- Wound  Assessment Details Patient Name: Date of Service: [name], [name] 07/25/2020 11:00 A M Medical Record Number: [id number] Patient Account Number: [id number] Date of Birth/Sex: Treating RN: 1951-01-05 (69 y.o. [name] Primary Care Lianna Sitzmann: [name], [place] R[place] Other Clinician: Referring Angelito Hopping: Treating Nataly Pacifico/Extender: [name], MA RY Weeks in Treatment: 435 Wound Status Wound Number: 16 Primary Pressure Ulcer Etiology: Wound Location: Right Calcaneus Wound Open Wounding Event: Pressure Injury Status: Date Acquired: 01/04/2020 Comorbid Anemia, Hypertension, Peripheral Arterial Disease, Peripheral Weeks Of Treatment: 29 History: Venous Disease, Raynauds, Scleroderma, Rheumatoid Arthritis, Clustered Wound: No Osteoarthritis Photos Photo Uploaded By: Mikeal Hawthorne on 07/25/2020 15:47:45 Wound Measurements Length: (cm) 0.9 Width: (cm) 1.5 Depth: (cm) 1 Area: (cm) 1.06 Volume: (cm) 1.06 % Reduction in Area: 91.6% % Reduction in Volume: 15.7% Epithelialization: None Tunneling: No Undermining: No Wound Description Classification: Unstageable/Unclassified Wound Margin: Well defined, not attached Exudate Amount: Small  Exudate Type: Purulent Exudate Color: yellow, brown, green Foul Odor After Cleansing: No Slough/Fibrino Yes Wound Bed Granulation Amount: None Present (0%) Exposed Structure Necrotic Amount: Large (67-100%) Fascia Exposed: No Necrotic Quality: Adherent Slough Fat Layer (Subcutaneous Tissue) Exposed: Yes Tendon Exposed: No Muscle Exposed: No Joint Exposed: No Bone Exposed: No Treatment Notes Wound #16 (Right Calcaneus) 1. Cleanse With Wound Cleanser Soap and water 2. Periwound Care Moisturizing lotion 3. Primary Dressing Applied Other primary dressing (specifiy in notes) 4. Secondary Dressing Dry Gauze 6. Support Layer Applied 3 layer compression wrap Notes Wellsite geologist) Signed: 07/25/2020  6:11:36 PM By: Kela Millin Entered By: Kela Millin on 07/25/2020 11:46:45 -------------------------------------------------------------------------------- Wound Assessment Details Patient Name: Date of Service: [name] Montey Hora F. 07/25/2020 11:00 A M Medical Record Number: [id number] Patient Account Number: [id number] Date of Birth/Sex: Treating RN: Feb 16, 1951 (69 y.o. [name] Primary Care Kynnedi Zweig: [name], [place] R[place] Other Clinician: Referring Nishka Heide: Treating Josyah Achor/Extender: [name], MA RY Weeks in Treatment: 435 Wound Status Wound Number: 19 Primary Venous Leg Ulcer Etiology: Wound Location: Right, Lateral Malleolus Wound Open Wounding Event: Gradually Appeared Status: Date Acquired: 05/02/2020 Comorbid Anemia, Hypertension, Peripheral Arterial Disease, Peripheral Weeks Of Treatment: 12 History: Venous Disease, Raynauds, Scleroderma, Rheumatoid Arthritis, Clustered Wound: No Osteoarthritis Photos Photo Uploaded By: Mikeal Hawthorne on 07/25/2020 15:47:14 Wound Measurements Length: (cm) 0.6 Width: (cm) 0.4 Depth: (cm) 0.5 Area: (cm) 0.188 Volume: (cm) 0.094 % Reduction in Area: -33.3% % Reduction in Volume: -235.7% Epithelialization: Large (67-100%) Tunneling: No Undermining: No Wound Description Classification: Full Thickness Without Exposed Support Structu Wound Margin: Well defined, not attached Exudate Amount: Small Exudate Type: Serous Exudate Color: amber res Foul Odor After Cleansing: No Slough/Fibrino Yes Wound Bed Granulation Amount: Medium (34-66%) Exposed Structure Granulation Quality: Pink Fascia Exposed: No Necrotic Amount: Medium (34-66%) Fat Layer (Subcutaneous Tissue) Exposed: Yes Necrotic Quality: Adherent Slough Tendon Exposed: No Muscle Exposed: No Joint Exposed: No Bone Exposed: No Treatment Notes Wound #19 (Right, Lateral Malleolus) 1. Cleanse With Wound Cleanser Soap and water 2. Periwound  Care Moisturizing lotion 3. Primary Dressing Applied Other primary dressing (specifiy in notes) 4. Secondary Dressing Dry Gauze 6. Support Layer Applied 3 layer compression wrap Notes Wellsite geologist) Signed: 07/25/2020 6:11:36 PM By: Kela Millin Entered By: Kela Millin on 07/25/2020 11:47:44 -------------------------------------------------------------------------------- Wound Assessment Details Patient Name: Date of Service: [name] 07/25/2020 11:00 A M Medical Record Number: [id number] Patient Account Number: [id number] Date of Birth/Sex: Treating RN: 09-03-1951 (69 y.o. [name] Primary Care Makenly Larabee: [name], [place] R[place] Other Clinician: Referring Feather Berrie: Treating Dequarius Jeffries/Extender: [name], MA RY Weeks in Treatment: 435 Wound Status Wound Number: 20 Primary Arterial Insufficiency Ulcer Etiology: Wound Location: Left, Lateral Malleolus Wound Open Wounding Event: Gradually Appeared Status: Date Acquired: 05/02/2020 Comorbid Anemia, Hypertension, Peripheral Arterial Disease, Peripheral Weeks Of Treatment: 12 History: Venous Disease, Raynauds, Scleroderma, Rheumatoid Arthritis, Clustered Wound: No Osteoarthritis Photos Photo Uploaded By: Mikeal Hawthorne on 07/25/2020 15:47:15 Wound Measurements Length: (cm) 0.8 Width: (cm) 0.7 Depth: (cm) 0.6 Area: (cm) 0.44 Volume: (cm) 0.264 % Reduction in Area: 0% % Reduction in Volume: -200% Epithelialization: None Tunneling: No Undermining: Yes Starting Position (o'clock): 9 Ending Position (o'clock): 4 Maximum Distance: (cm) 0.4 Wound Description Classification: Full Thickness Without Exposed Support Structures Wound Margin: Well defined, not attached Exudate Amount: Small Exudate Type: Purulent Exudate Color: yellow, brown, green Foul Odor After Cleansing: No Slough/Fibrino Yes Wound Bed Granulation Amount:  None Present (0%) Exposed Structure Necrotic  Amount: Large (67-100%) Fascia Exposed: No Necrotic Quality: Adherent Slough Fat Layer (Subcutaneous Tissue) Exposed: Yes Tendon Exposed: No Muscle Exposed: No Joint Exposed: No Bone Exposed: No Treatment Notes Wound #20 (Left, Lateral Malleolus) 1. Cleanse With Wound Cleanser Soap and water 2. Periwound Care Moisturizing lotion 3. Primary Dressing Applied Other primary dressing (specifiy in notes) 4. Secondary Dressing Dry Gauze 6. Support Layer Applied 3 layer compression wrap Notes Wellsite geologist) Signed: 07/25/2020 6:11:36 PM By: Kela Millin Entered By: Kela Millin on 07/25/2020 11:48:52 -------------------------------------------------------------------------------- Wound Assessment Details Patient Name: Date of Service: [name] 07/25/2020 11:00 A M Medical Record Number: [id number] Patient Account Number: [id number] Date of Birth/Sex: Treating RN: [birth year] (69 y.o. [name] Primary Care Dagny Fiorentino: [name], [place] R[place] Other Clinician: Referring Vangie Henthorn: Treating Dangelo Guzzetta/Extender: [name], MA RY Weeks in Treatment: 435 Wound Status Wound Number: 21 Primary Venous Leg Ulcer Etiology: Wound Location: Left, Lateral Lower Leg Wound Open Wounding Event: Gradually Appeared Status: Date Acquired: 06/14/2020 Comorbid Anemia, Hypertension, Peripheral Arterial Disease, Peripheral Weeks Of Treatment: 5 History: Venous Disease, Raynauds, Scleroderma, Rheumatoid Arthritis, Clustered Wound: Yes Osteoarthritis Photos Photo Uploaded By: Mikeal Hawthorne on 07/25/2020 15:40:43 Wound Measurements Length: (cm) 0.7 Width: (cm) 0.5 Depth: (cm) 0.3 Area: (cm) 0.275 Volume: (cm) 0.082 % Reduction in Area: 84.4% % Reduction in Volume: 53.7% Epithelialization: Large (67-100%) Tunneling: No Undermining: No Wound Description Classification: Full Thickness Without Exposed Support Structures Wound Margin: Flat  and Intact Exudate Amount: Small Exudate Type: Serous Exudate Color: amber Foul Odor After Cleansing: No Slough/Fibrino Yes Wound Bed Granulation Amount: Small (1-33%) Exposed Structure Granulation Quality: Pink Fascia Exposed: No Necrotic Amount: Large (67-100%) Fat Layer (Subcutaneous Tissue) Exposed: Yes Necrotic Quality: Adherent Slough Tendon Exposed: No Muscle Exposed: No Joint Exposed: No Bone Exposed: No Treatment Notes Wound #21 (Left, Lateral Lower Leg) 1. Cleanse With Wound Cleanser Soap and water 2. Periwound Care Moisturizing lotion 3. Primary Dressing Applied Other primary dressing (specifiy in notes) 4. Secondary Dressing Dry Gauze 6. Support Layer Applied 3 layer compression wrap Notes Wellsite geologist) Signed: 07/25/2020 6:11:36 PM By: Kela Millin Entered By: Kela Millin on 07/25/2020 11:50:09 -------------------------------------------------------------------------------- Wound Assessment Details Patient Name: Date of Service: [name] 07/25/2020 11:00 A M Medical Record Number: [id number] Patient Account Number: [id number] Date of Birth/Sex: Treating RN: [birth year] (69 y.o. [name] Primary Care Caasi Giglia: [name], [place] RY Other Clinician: Referring Caelie Remsburg: Treating Cherlynn Popiel/Extender: [name], MA RY Weeks in Treatment: 435 Wound Status Wound Number: 22 Primary Venous Leg Ulcer Etiology: Wound Location: Left, Distal, Lateral Lower Leg Wound Healed - Epithelialized Wounding Event: Gradually Appeared Status: Date Acquired: 06/20/2020 Comorbid Anemia, Hypertension, Peripheral Arterial Disease, Peripheral Weeks Of Treatment: 5 History: Venous Disease, Raynauds, Scleroderma, Rheumatoid Arthritis, Clustered Wound: No Osteoarthritis Photos Photo Uploaded By: Mikeal Hawthorne on 07/25/2020 15:40:43 Wound Measurements Length: (cm) Width: (cm) Depth: (cm) Area: (cm) Volume: (cm) 0  % Reduction in Area: 100% 0 % Reduction in Volume: 100% 0 Epithelialization: None 0 Tunneling: No 0 Undermining: No Wound Description Classification: Full Thickness Without Exposed Support Structures Wound Margin: Distinct, outline attached Exudate Amount: None Present Foul Odor After Cleansing: No Slough/Fibrino No Wound Bed Granulation Amount: None Present (0%) Exposed Structure Necrotic Amount: None Present (0%) Fascia Exposed: No Fat Layer (Subcutaneous Tissue) Exposed: No Tendon Exposed: No Muscle Exposed: No Joint Exposed: No Bone Exposed: No Electronic Signature(s)  Signed: 07/25/2020 6:11:36 PM By: Kela Millin Entered By: Kela Millin on 07/25/2020 11:50:47 -------------------------------------------------------------------------------- Wound Assessment Details Patient Name: Date of Service: [name] 07/25/2020 11:00 A M Medical Record Number: [id number] Patient Account Number: [id number] Date of Birth/Sex: Treating RN: 1951-08-30 (69 y.o. [name] Primary Care Cait Locust: [name], [place] R[place] Other Clinician: Referring Zayin Valadez: Treating Kaleiyah Polsky/Extender: [name], MA RY Weeks in Treatment: 435 Wound Status Wound Number: 5 Primary Venous Leg Ulcer Etiology: Wound Location: Right, Medial Lower Leg Wound Open Wounding Event: Gradually Appeared Status: Date Acquired: 01/13/2013 Comorbid Anemia, Hypertension, Peripheral Arterial Disease, Peripheral Weeks Of Treatment: 391 History: Venous Disease, Raynauds, Scleroderma, Rheumatoid Arthritis, Clustered Wound: Yes Osteoarthritis Photos Photo Uploaded By: Mikeal Hawthorne on 07/25/2020 15:47:46 Wound Measurements Length: (cm) 2.1 Width: (cm) 2.1 Depth: (cm) 0.9 Area: (cm) 3.464 Volume: (cm) 3.117 % Reduction in Area: -145% % Reduction in Volume: -2110.6% Epithelialization: Small (1-33%) Tunneling: No Undermining: No Wound Description Classification: Full Thickness  With Exposed Support Structures Wound Margin: Distinct, outline attached Exudate Amount: Medium Exudate Type: Purulent Exudate Color: yellow, brown, green Foul Odor After Cleansing: No Slough/Fibrino Yes Wound Bed Granulation Amount: Small (1-33%) Exposed Structure Granulation Quality: Pink Fascia Exposed: No Necrotic Amount: Large (67-100%) Fat Layer (Subcutaneous Tissue) Exposed: Yes Necrotic Quality: Adherent Slough Tendon Exposed: No Muscle Exposed: No Joint Exposed: No Bone Exposed: No Treatment Notes Wound #5 (Right, Medial Lower Leg) 1. Cleanse With Wound Cleanser Soap and water 2. Periwound Care Moisturizing lotion 3. Primary Dressing Applied Other primary dressing (specifiy in notes) 4. Secondary Dressing Dry Gauze 6. Support Layer Applied 3 layer compression wrap Notes Wellsite geologist) Signed: 07/25/2020 6:11:36 PM By: Kela Millin Entered By: Kela Millin on 07/25/2020 11:52:22 -------------------------------------------------------------------------------- Vitals Details Patient Name: Date of Service: [name], [name] 07/25/2020 11:00 A M Medical Record Number: [id number] Patient Account Number: [id number] Date of Birth/Sex: Treating RN: Jan 01, 1951 (69 y.o. [name] Primary Care Cayetano Mikita: [name], [place] R[place] Other Clinician: Referring Rodnesha Elie: Treating Arlie Riker/Extender: [name], MA RY Weeks in Treatment: 435 Vital Signs Time Taken: 11:30 Temperature (F): 98.7 Height (in): 68 Pulse (bpm): 75 Weight (lbs): 132 Respiratory Rate (breaths/min): 19 Body Mass Index (BMI): 20.1 Blood Pressure (mmHg): 156/69 Reference Range: 80 - 120 mg / dl Electronic Signature(s) Signed: 07/25/2020 6:11:36 PM By: Kela Millin Entered By: Kela Millin on 07/25/2020 11:31:32

## 2020-07-28 ENCOUNTER — Other Ambulatory Visit (HOSPITAL_COMMUNITY): Payer: Self-pay | Admitting: Internal Medicine

## 2020-07-29 DIAGNOSIS — J3489 Other specified disorders of nose and nasal sinuses: Secondary | ICD-10-CM | POA: Diagnosis not present

## 2020-07-29 DIAGNOSIS — Z9622 Myringotomy tube(s) status: Secondary | ICD-10-CM | POA: Diagnosis not present

## 2020-07-29 DIAGNOSIS — Z9981 Dependence on supplemental oxygen: Secondary | ICD-10-CM | POA: Diagnosis not present

## 2020-07-29 DIAGNOSIS — H6522 Chronic serous otitis media, left ear: Secondary | ICD-10-CM | POA: Diagnosis not present

## 2020-07-29 DIAGNOSIS — H906 Mixed conductive and sensorineural hearing loss, bilateral: Secondary | ICD-10-CM | POA: Diagnosis not present

## 2020-07-29 DIAGNOSIS — Z9089 Acquired absence of other organs: Secondary | ICD-10-CM | POA: Diagnosis not present

## 2020-07-29 DIAGNOSIS — H6983 Other specified disorders of Eustachian tube, bilateral: Secondary | ICD-10-CM | POA: Insufficient documentation

## 2020-08-02 ENCOUNTER — Encounter (HOSPITAL_BASED_OUTPATIENT_CLINIC_OR_DEPARTMENT_OTHER): Payer: Medicare Other | Admitting: Internal Medicine

## 2020-08-02 ENCOUNTER — Other Ambulatory Visit: Payer: Self-pay | Admitting: Internal Medicine

## 2020-08-02 DIAGNOSIS — H6522 Chronic serous otitis media, left ear: Secondary | ICD-10-CM | POA: Diagnosis not present

## 2020-08-03 ENCOUNTER — Encounter (HOSPITAL_BASED_OUTPATIENT_CLINIC_OR_DEPARTMENT_OTHER): Payer: Medicare Other | Attending: Internal Medicine | Admitting: Physician Assistant

## 2020-08-03 DIAGNOSIS — L97229 Non-pressure chronic ulcer of left calf with unspecified severity: Secondary | ICD-10-CM | POA: Diagnosis not present

## 2020-08-03 DIAGNOSIS — L97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: Secondary | ICD-10-CM | POA: Diagnosis not present

## 2020-08-03 DIAGNOSIS — L97811 Non-pressure chronic ulcer of other part of right lower leg limited to breakdown of skin: Secondary | ICD-10-CM | POA: Insufficient documentation

## 2020-08-03 DIAGNOSIS — I83222 Varicose veins of left lower extremity with both ulcer of calf and inflammation: Secondary | ICD-10-CM | POA: Diagnosis not present

## 2020-08-03 DIAGNOSIS — L97828 Non-pressure chronic ulcer of other part of left lower leg with other specified severity: Secondary | ICD-10-CM | POA: Insufficient documentation

## 2020-08-03 DIAGNOSIS — I87331 Chronic venous hypertension (idiopathic) with ulcer and inflammation of right lower extremity: Secondary | ICD-10-CM | POA: Diagnosis not present

## 2020-08-04 NOTE — Progress Notes (Signed)
[name], [name] ([id number]) Visit Report for 08/03/2020 Arrival Information Details Patient Name: Date of Service: [name] 08/03/2020 11:15 A M Medical Record Number: [id number] Patient Account Number: [id number] Date of Birth/Sex: Treating RN: Feb 14, 1951 (69 y.o. [name] [name] Primary Care Taos Tapp: [name], [place] R[place] Other Clinician: Referring Marci Polito: Treating Alinah Sheard/Extender: Garret Reddish, MA RY Weeks in Treatment: 57 Visit Information History Since Last Visit Added or deleted any medications: No Patient Arrived: Ambulatory Any new allergies or adverse reactions: No Arrival Time: 11:55 Had a fall or experienced change in No Accompanied By: husband activities of daily living that may affect Transfer Assistance: None risk of falls: Patient Identification Verified: Yes Signs or symptoms of abuse/neglect since last visito No Secondary Verification Process Completed: Yes Hospitalized since last visit: No Patient Requires Transmission-Based Precautions: No Implantable device outside of the clinic excluding No Patient Has Alerts: No cellular tissue based products placed in the center since last visit: Has Dressing in Place as Prescribed: Yes Has Compression in Place as Prescribed: Yes Pain Present Now: No Electronic Signature(s) Signed: 08/04/2020 5:21:32 PM By: Levan Hurst RN, BSN Entered By: Levan Hurst on 08/03/2020 13:30:20 -------------------------------------------------------------------------------- Compression Therapy Details Patient Name: Date of Service: BLA [name] [name] F. 08/03/2020 11:15 A M Medical Record Number: [id number] Patient Account Number: [id number] Date of Birth/Sex: Treating RN: 30-Jun-1951 (69 y.o. [name] [name] Primary Care Brisa Auth: [name], [place] R[place] Other Clinician: Referring Seydina Holliman: Treating Alexander Mcauley/Extender: Garret Reddish, MA RY Weeks in Treatment: 436 Compression Therapy Performed for Wound  Assessment: Wound #16 Right Calcaneus Performed By: Clinician Levan Hurst, RN Compression Type: Three Hydrologist) Signed: 08/04/2020 5:21:32 PM By: Levan Hurst RN, BSN Entered By: Levan Hurst on 08/03/2020 14:48:41 -------------------------------------------------------------------------------- Compression Therapy Details Patient Name: Date of Service: BLA [name] [name] F. 08/03/2020 11:15 A M Medical Record Number: [id number] Patient Account Number: [id number] Date of Birth/Sex: Treating RN: October 14, 1951 (69 y.o. [name] [name] Primary Care [name] [name]: [name], [place] R[place] Other Clinician: Referring Minda Faas: Treating [name] [name]/Extender: Garret Reddish, MA RY Weeks in Treatment: 436 Compression Therapy Performed for Wound Assessment: Wound #19 Right,Lateral Malleolus Performed By: Clinician Levan Hurst, RN Compression Type: Three Hydrologist) Signed: 08/04/2020 5:21:32 PM By: Levan Hurst RN, BSN Entered By: Levan Hurst on 08/03/2020 14:48:41 -------------------------------------------------------------------------------- Compression Therapy Details Patient Name: Date of Service: BLA [name] [name] F. 08/03/2020 11:15 A M Medical Record Number: [id number] Patient Account Number: [id number] Date of Birth/Sex: Treating RN: 1951/08/11 (69 y.o. [name] [name] Primary Care [name] [name]: [name], [place] R[place] Other Clinician: Referring Richell Corker: Treating [name] [name]/Extender: Garret Reddish, MA RY Weeks in Treatment: 436 Compression Therapy Performed for Wound Assessment: Wound #20 Left,Lateral Malleolus Performed By: Clinician Levan Hurst, RN Compression Type: Three Hydrologist) Signed: 08/04/2020 5:21:32 PM By: Levan Hurst RN, BSN Entered By: Levan Hurst on 08/03/2020 14:48:41 -------------------------------------------------------------------------------- Compression Therapy Details Patient Name: Date of  Service: BLA [name] [name] F. 08/03/2020 11:15 A M Medical Record Number: [id number] Patient Account Number: [id number] Date of Birth/Sex: Treating RN: 1951-12-11 (69 y.o. [name] [name] Primary Care [name] [name]: [name], [place] R[place] Other Clinician: Referring Mayco Walrond: Treating Cariann Kinnamon/Extender: Garret Reddish, MA RY Weeks in Treatment: 436 Compression Therapy Performed for Wound Assessment: Wound #21 Left,Lateral Lower Leg Performed By: Clinician Levan Hurst, RN Compression Type: Three Layer Electronic Signature(s) Signed: 08/04/2020 5:21:32  PM By: Levan Hurst RN, BSN Entered By: Levan Hurst on 08/03/2020 14:48:41 -------------------------------------------------------------------------------- Compression Therapy Details Patient Name: Date of Service: [name], [name] 08/03/2020 11:15 A M Medical Record Number: [id number] Patient Account Number: [id number] Date of Birth/Sex: Treating RN: [birth year] (69 y.o. [name] [name] Primary Care Marqueta Pulley: Other Clinician: [name], [place] RY Referring [name] [name]: Treating [name] [name]/Extender: Garret Reddish, MA RY Weeks in Treatment: 436 Compression Therapy Performed for Wound Assessment: Wound #5 Right,Medial Lower Leg Performed By: Clinician Levan Hurst, RN Compression Type: Three Hydrologist) Signed: 08/04/2020 5:21:32 PM By: Levan Hurst RN, BSN Entered By: Levan Hurst on 08/03/2020 14:48:41 -------------------------------------------------------------------------------- Encounter Discharge Information Details Patient Name: Date of Service: [name], [name] 08/03/2020 11:15 A M Medical Record Number: [id number] Patient Account Number: [id number] Date of Birth/Sex: Treating RN: Nov 16, 1951 (69 y.o. [name] [name] Primary Care [name] [name]: [name], [place] R[place] Other Clinician: Referring [name] [name]: Treating [name] [name]/Extender: Garret Reddish, MA RY Weeks in Treatment: [phone] Encounter  Discharge Information Items Discharge Condition: Stable Ambulatory Status: Ambulatory Discharge Destination: Home Transportation: Private Auto Accompanied By: husband Schedule Follow-up Appointment: Yes Clinical Summary of Care: Patient Declined Electronic Signature(s) Signed: 08/04/2020 5:21:32 PM By: Levan Hurst RN, BSN Entered By: Levan Hurst on 08/03/2020 14:55:39 -------------------------------------------------------------------------------- Wound Assessment Details Patient Name: Date of Service: BLA [name] [name] F. 08/03/2020 11:15 A M Medical Record Number: [id number] Patient Account Number: [id number] Date of Birth/Sex: Treating RN: [birth year] (69 y.o. [name] [name] Primary Care Shena Vinluan: [name], [place] R[place] Other Clinician: Referring Naythen Heikkila: Treating Xane Amsden/Extender: Garret Reddish, MA RY Weeks in Treatment: 436 Wound Status Wound Number: 16 Primary Etiology: Pressure Ulcer Wound Location: Right Calcaneus Wound Status: Open Wounding Event: Pressure Injury Date Acquired: 01/04/2020 Weeks Of Treatment: 30 Clustered Wound: No Wound Measurements Length: (cm) 0.9 Width: (cm) 1.5 Depth: (cm) 1 Area: (cm) 1.06 Volume: (cm) 1.06 Wound Description Classification: Unstageable/Unclassified % Reduction in Area: 91.6% % Reduction in Volume: 15.7% Treatment Notes Wound #16 (Right Calcaneus) 1. Cleanse With Soap and water 2. Periwound Care Moisturizing lotion 3. Primary Dressing Applied Other primary dressing (specifiy in notes) 4. Secondary Dressing Dry Gauze 6. Support Layer Applied 3 layer compression wrap Notes cutimed Theatre manager) Signed: 08/04/2020 5:21:32 PM By: Levan Hurst RN, BSN Entered By: Levan Hurst on 08/03/2020 13:30:54 -------------------------------------------------------------------------------- Wound Assessment Details Patient Name: Date of Service: [name], [name] 08/03/2020 11:15 A  M Medical Record Number: [id number] Patient Account Number: [id number] Date of Birth/Sex: Treating RN: October 21, 1951 (69 y.o. [name] [name] Primary Care Ma Munoz: [name], [place] R[place] Other Clinician: Referring Kima Malenfant: Treating Atticus Lemberger/Extender: Garret Reddish, MA RY Weeks in Treatment: 436 Wound Status Wound Number: 19 Primary Etiology: Venous Leg Ulcer Wound Location: Right, Lateral Malleolus Wound Status: Open Wounding Event: Gradually Appeared Date Acquired: 05/02/2020 Weeks Of Treatment: 13 Clustered Wound: No Wound Measurements Length: (cm) 0.6 Width: (cm) 0.4 Depth: (cm) 0.5 Area: (cm) 0.188 Volume: (cm) 0.094 % Reduction in Area: -33.3% % Reduction in Volume: -235.7% Wound Description Classification: Full Thickness Without Exposed Support Structur es Treatment Notes Wound #19 (Right, Lateral Malleolus) 1. Cleanse With Soap and water 2. Periwound Care Moisturizing lotion 3. Primary Dressing Applied Other primary dressing (specifiy in notes) 4. Secondary Dressing Dry Gauze 6. Support Layer Applied 3 layer compression wrap Notes cutimed Theatre manager) Signed: 08/04/2020 5:21:32 PM By: Levan Hurst  RN, BSN Entered By: Levan Hurst on 08/03/2020 13:30:54 -------------------------------------------------------------------------------- Wound Assessment Details Patient Name: Date of Service: [name] 08/03/2020 11:15 A M Medical Record Number: [id number] Patient Account Number: [id number] Date of Birth/Sex: Treating RN: 1951-08-14 (69 y.o. [name] [name] Primary Care Arriel Victor: [name], [place] R[place] Other Clinician: Referring Tanique Matney: Treating Emeline Simpson/Extender: Garret Reddish, [place] RY Weeks in Treatment: 436 Wound Status Wound Number: 20 Primary Etiology: Arterial Insufficiency Ulcer Wound Location: Left, Lateral Malleolus Wound Status: Open Wounding Event: Gradually Appeared Date Acquired:  05/02/2020 Weeks Of Treatment: 13 Clustered Wound: No Wound Measurements Length: (cm) 0.8 Width: (cm) 0.7 Depth: (cm) 0.6 Area: (cm) 0.44 Volume: (cm) 0.264 % Reduction in Area: 0% % Reduction in Volume: -200% Wound Description Classification: Full Thickness Without Exposed Support Structur es Treatment Notes Wound #20 (Left, Lateral Malleolus) 1. Cleanse With Soap and water 2. Periwound Care Moisturizing lotion 3. Primary Dressing Applied Other primary dressing (specifiy in notes) 4. Secondary Dressing Dry Gauze 6. Support Layer Applied 3 layer compression wrap Notes cutimed Theatre manager) Signed: 08/04/2020 5:21:32 PM By: Levan Hurst RN, BSN Entered By: Levan Hurst on 08/03/2020 13:30:55 -------------------------------------------------------------------------------- Wound Assessment Details Patient Name: Date of Service: [name], [name] 08/03/2020 11:15 A M Medical Record Number: [id number] Patient Account Number: [id number] Date of Birth/Sex: Treating RN: [birth year] (69 y.o. [name] [name] Primary Care Sonakshi Rolland: [name], [place] R[place] Other Clinician: Referring Yazaira Speas: Treating Mykaylah Ballman/Extender: Garret Reddish, [place] RY Weeks in Treatment: 436 Wound Status Wound Number: 21 Primary Etiology: Venous Leg Ulcer Wound Location: Left, Lateral Lower Leg Wound Status: Open Wounding Event: Gradually Appeared Date Acquired: 06/14/2020 Weeks Of Treatment: 7 Clustered Wound: Yes Wound Measurements Length: (cm) 0.7 Width: (cm) 0.5 Depth: (cm) 0.3 Area: (cm) 0.275 Volume: (cm) 0.082 % Reduction in Area: 84.4% % Reduction in Volume: 53.7% Wound Description Classification: Full Thickness Without Exposed Support Structur es Treatment Notes Wound #21 (Left, Lateral Lower Leg) 1. Cleanse With Soap and water 2. Periwound Care Moisturizing lotion 3. Primary Dressing Applied Other primary dressing (specifiy in notes) 4.  Secondary Dressing Dry Gauze 6. Support Layer Applied 3 layer compression wrap Notes cutimed Theatre manager) Signed: 08/04/2020 5:21:32 PM By: Levan Hurst RN, BSN Entered By: Levan Hurst on 08/03/2020 13:30:55 -------------------------------------------------------------------------------- Wound Assessment Details Patient Name: Date of Service: [name], [name] 08/03/2020 11:15 A M Medical Record Number: [id number] Patient Account Number: [id number] Date of Birth/Sex: Treating RN: [birth year] (69 y.o. [name] [name] Primary Care Maurice Fotheringham: [name], [place] R[place] Other Clinician: Referring Athleen Feltner: Treating Leonna Schlee/Extender: Garret Reddish, [place] RY Weeks in Treatment: 436 Wound Status Wound Number: 5 Primary Etiology: Venous Leg Ulcer Wound Location: Right, Medial Lower Leg Wound Status: Open Wounding Event: Gradually Appeared Date Acquired: 01/13/2013 Weeks Of Treatment: 392 Clustered Wound: Yes Wound Measurements Length: (cm) 2.1 Width: (cm) 2.1 Depth: (cm) 0.9 Area: (cm) 3.464 Volume: (cm) 3.117 % Reduction in Area: -145% % Reduction in Volume: -2110.6% Wound Description Classification: Full Thickness With Exposed Support Structures Treatment Notes Wound #5 (Right, Medial Lower Leg) 1. Cleanse With Soap and water 2. Periwound Care Moisturizing lotion 3. Primary Dressing Applied Other primary dressing (specifiy in notes) 4. Secondary Dressing Dry Gauze 6. Support Layer Applied 3 layer compression wrap Notes cutimed Theatre manager) Signed: 08/04/2020 5:21:32 PM By: Levan Hurst RN, BSN Entered By: Levan Hurst on 08/03/2020 13:30:55

## 2020-08-04 NOTE — Progress Notes (Signed)
ELLEY, HARP ([id number]) Visit Report for 08/03/2020 SuperBill Details Patient Name: Date of Service: [name] 08/03/2020 Medical Record Number: [id number] Patient Account Number: [id number] Date of Birth/Sex: Treating RN: January 11, 1951 (69 y.o. 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 Electronic Signature(s) Signed: 08/03/2020 5:10:43 PM By: Worthy Keeler PA-C Signed: 08/04/2020 5:21:32 PM By: Levan Hurst RN, BSN Entered By: Levan Hurst on 08/03/2020 14:55:50

## 2020-08-08 ENCOUNTER — Encounter (HOSPITAL_BASED_OUTPATIENT_CLINIC_OR_DEPARTMENT_OTHER): Payer: Medicare Other | Admitting: Internal Medicine

## 2020-08-08 ENCOUNTER — Other Ambulatory Visit: Payer: Self-pay | Admitting: Internal Medicine

## 2020-08-08 DIAGNOSIS — L97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: Secondary | ICD-10-CM | POA: Diagnosis not present

## 2020-08-08 DIAGNOSIS — I83222 Varicose veins of left lower extremity with both ulcer of calf and inflammation: Secondary | ICD-10-CM | POA: Diagnosis not present

## 2020-08-08 DIAGNOSIS — L97811 Non-pressure chronic ulcer of other part of right lower leg limited to breakdown of skin: Secondary | ICD-10-CM | POA: Diagnosis not present

## 2020-08-08 DIAGNOSIS — L97828 Non-pressure chronic ulcer of other part of left lower leg with other specified severity: Secondary | ICD-10-CM | POA: Diagnosis not present

## 2020-08-08 DIAGNOSIS — I87331 Chronic venous hypertension (idiopathic) with ulcer and inflammation of right lower extremity: Secondary | ICD-10-CM | POA: Diagnosis not present

## 2020-08-08 DIAGNOSIS — L97229 Non-pressure chronic ulcer of left calf with unspecified severity: Secondary | ICD-10-CM | POA: Diagnosis not present

## 2020-08-08 NOTE — Progress Notes (Signed)
JOHNETTE, [name] ([id number]) Visit Report for 08/08/2020 SuperBill Details Patient Name: Date of Service: [name] [name] 08/08/2020 Medical Record Number: [id number] Patient Account Number: [id number] Date of Birth/Sex: Treating RN: May 21, 1951 (69 y.o. 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 Electronic Signature(s) Signed: 08/08/2020 4:24:36 PM By: Tobi Bastos MD, MBA Signed: 08/08/2020 4:51:12 PM By: Baruch Gouty RN, BSN Entered By: Baruch Gouty on 08/08/2020 12:46:46

## 2020-08-09 NOTE — Progress Notes (Signed)
[name] [name] ([id number]) Visit Report for 08/08/2020 Arrival Information Details Patient Name: Date of Service: [name] [name] 08/08/2020 11:15 A M Medical Record Number: [id number] Patient Account Number: [id number] Date of Birth/Sex: Treating RN: [birth year] (69 y.o. [name] [name] Primary Care Braylie Badami: [name] [name], [place] RY Other Clinician: Referring [name] [name]: Treating [name] [name]/Extender: [name] Hoover, MA RY Weeks in Treatment: 437 Visit Information History Since Last Visit Added or deleted any medications: No Patient Arrived: Ambulatory Any new allergies or adverse reactions: No Arrival Time: 12:00 Had a fall or experienced change in No Accompanied By: husband activities of daily living that may affect Transfer Assistance: None risk of falls: Patient Identification Verified: Yes Signs or symptoms of abuse/neglect since last visito No Secondary Verification Process Completed: Yes Hospitalized since last visit: No Patient Requires Transmission-Based Precautions: No Implantable device outside of the clinic excluding No Patient Has Alerts: No cellular tissue based products placed in the center since last visit: Has Dressing in Place as Prescribed: Yes Pain Present Now: No Electronic Signature(s) Signed: 08/09/2020 10:17:44 AM By: [name] [name] Entered By: [name] [name] on 08/08/2020 12:05:04 -------------------------------------------------------------------------------- Compression Therapy Details Patient Name: Date of Service: [name] [name] 08/08/2020 11:15 A M Medical Record Number: [id number] Patient Account Number: [id number] Date of Birth/Sex: Treating RN: [birth year] (69 y.o. [name] [name]: [name] [name], [place] RY Other Clinician: Referring Bela Nyborg: Treating [name] [name]/Extender: [name] Hoover, MA RY Weeks in Treatment: 437 Compression Therapy Performed for Wound Assessment: Wound #5 Right,Medial Lower Leg Performed  By: Clinician [name] Gouty, RN Compression Type: Three Hydrologist) Signed: 08/08/2020 4:51:12 PM By: [name] Gouty RN, BSN Entered By: [name] [name] on 08/08/2020 12:44:53 -------------------------------------------------------------------------------- Compression Therapy Details Patient Name: Date of Service: [name] [name] 08/08/2020 11:15 A M Medical Record Number: [id number] Patient Account Number: [id number] Date of Birth/Sex: Treating RN: [birth year] (69 y.o. [name] [name] Primary Care Punam Broussard: Other Clinician: [name], MA RY Referring Wildon Cuevas: Treating [name] [name]/Extender: [name] Hoover, MA RY Weeks in Treatment: 437 Compression Therapy Performed for Wound Assessment: Wound #20 Left,Lateral Malleolus Performed By: Clinician [name] Gouty, RN Compression Type: Three Hydrologist) Signed: 08/08/2020 4:51:12 PM By: [name] Gouty RN, BSN Entered By: [name] [name] on 08/08/2020 12:44:53 -------------------------------------------------------------------------------- Encounter Discharge Information Details Patient Name: Date of Service: [name] [name], [name] 08/08/2020 11:15 A M Medical Record Number: [id number] Patient Account Number: [id number] Date of Birth/Sex: Treating RN: 02-19-1951 (69 y.o. [name] [name] Primary Care Taje Tondreau: [name] [name], [place] RY Other Clinician: Referring [name] [name]: Treating Teria Khachatryan/Extender: [name] Hoover, MA RY Weeks in Treatment: 437 Encounter Discharge Information Items Discharge Condition: Stable Ambulatory Status: Ambulatory Discharge Destination: Home Transportation: Private Auto Accompanied By: spouse Schedule Follow-up Appointment: Yes Clinical Summary of Care: Patient Declined Electronic Signature(s) Signed: 08/08/2020 4:51:12 PM By: [name] Gouty RN, BSN Entered By: [name] [name] on 08/08/2020  12:46:28 -------------------------------------------------------------------------------- Patient/Caregiver Education Details Patient Name: Date of Service: [name] [name] 8/9/2021andnbsp11:15 A M Medical Record Number: [id number] Patient Account Number: [id number] Date of Birth/Gender: Treating RN: 08-19-1951 (69 y.o. [name] [name] Primary Care Physician: [name] [name], [place] RY Other Clinician: Referring Physician: Treating Physician/Extender: [name] Hoover, MA RY Weeks in Treatment: 720 Education Assessment Education Provided To: Patient Education Topics Provided Wound/Skin Impairment: Methods: Explain/Verbal Responses: Reinforcements needed, State content correctly Electronic Signature(s) Signed: 08/08/2020 4:51:12 PM By: [name] Gouty RN, BSN  Entered By: [name] [name] on 08/08/2020 12:46:09 -------------------------------------------------------------------------------- Wound Assessment Details Patient Name: Date of Service: [name] [name] 08/08/2020 11:15 A M Medical Record Number: [id number] Patient Account Number: [id number] Date of Birth/Sex: Treating RN: 1951-07-08 (69 y.o. 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 Wound Open Wounding Event: Pressure Injury Status: Date Acquired: 01/04/2020 Comorbid Anemia, Hypertension, Peripheral Arterial Disease, Peripheral Weeks Of Treatment: 31 History: Venous Disease, Raynauds, Scleroderma, Rheumatoid Arthritis, Clustered Wound: No Osteoarthritis Wound Measurements Length: (cm) 0 Width: (cm) 1 Depth: (cm) 1 Area: (cm) Volume: (cm) .9 % Reduction in Area: 91.6% .5 % Reduction in Volume: 15.7% Epithelialization: None 1.06 Tunneling: No 1.06 Undermining: No Wound  Description Classification: Unstageable/Unclassified Wound Margin: Flat and Intact Exudate Amount: Small Foul Odor After Cleansing: No Slough/Fibrino Yes Wound Bed Granulation Amount: None Present (0%) Exposed Structure Necrotic Amount: Large (67-100%) Fascia Exposed: No Necrotic Quality: Adherent Slough Fat Layer (Subcutaneous Tissue) Exposed: Yes Tendon Exposed: No Muscle Exposed: No Joint Exposed: No Bone Exposed: No Treatment Notes Wound #16 (Right Calcaneus) 2. Periwound Care Moisturizing lotion 3. Primary Dressing Applied Hydrogel or K-Y Jelly Other primary dressing (specifiy in notes) 4. Secondary Dressing Dry Gauze 6. Support Layer Applied 3 layer compression wrap Notes sorbact swab Electronic Signature(s) Signed: 08/08/2020 4:51:12 PM By: [name] Gouty RN, BSN Signed: 08/08/2020 5:19:36 PM By: Levan Hurst RN, BSN Entered By: [name] [name] on 08/08/2020 12:39:47 -------------------------------------------------------------------------------- Wound Assessment Details Patient Name: Date of Service: [name] [name] 08/08/2020 11:15 A M Medical Record Number: [id number] Patient Account Number: [id number] Date of Birth/Sex: Treating RN: 13-Aug-1951 (69 y.o. [name] [name] Primary Care Serena Petterson: [name] [name], [place] RY Other Clinician: Referring Pavan Bring: Treating Lavra Imler/Extender: [name] Hoover, MA RY Weeks in Treatment: 437 Wound Status Wound Number: 19 Primary Venous Leg Ulcer Etiology: Wound Location: Right, Lateral Malleolus Wound Open Wounding Event: Gradually Appeared Status: Date Acquired: 05/02/2020 Comorbid Anemia, Hypertension, Peripheral Arterial Disease, Peripheral Weeks Of Treatment: 14 History: Venous Disease, Raynauds, Scleroderma, Rheumatoid Arthritis, Clustered Wound: No Osteoarthritis Wound Measurements Length: (cm) 0.6 Width: (cm) 0.4 Depth: (cm) 0.5 Area: (cm) 0.188 Volume: (cm) 0.094 % Reduction in Area: -33.3% %  Reduction in Volume: -235.7% Epithelialization: None Tunneling: No Undermining: No Wound Description Classification: Full Thickness Without Exposed Support Structures Wound Margin: Flat and Intact Exudate Amount: Small Exudate Type: Serous Exudate Color: amber Foul Odor After Cleansing: No Slough/Fibrino Yes Wound Bed Granulation Amount: None Present (0%) Exposed Structure Necrotic Amount: Large (67-100%) Fascia Exposed: No Necrotic Quality: Adherent Slough Fat Layer (Subcutaneous Tissue) Exposed: Yes Tendon Exposed: No Muscle Exposed: No Joint Exposed: No Bone Exposed: No Treatment Notes Wound #19 (Right, Lateral Malleolus) 2. Periwound Care Moisturizing lotion 3. Primary Dressing Applied Hydrogel or K-Y Jelly Other primary dressing (specifiy in notes) 4. Secondary Dressing Dry Gauze 6. Support Layer Applied 3 layer compression wrap Notes sorbact swab Electronic Signature(s) Signed: 08/08/2020 4:51:12 PM By: [name] Gouty RN, BSN Signed: 08/08/2020 5:19:36 PM By: Levan Hurst RN, BSN Entered By: [name] [name] on 08/08/2020 12:40:50 -------------------------------------------------------------------------------- Wound Assessment Details Patient Name: Date of Service: [name] [name], [name] 08/08/2020 11:15 A M Medical Record Number: [id number] Patient Account Number: [id number] Date of Birth/Sex: Treating RN: 1951-01-03 (69 y.o. 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, Lateral Malleolus Wound Open Wounding Event: Gradually Appeared Status: Date Acquired: 05/02/2020 Comorbid Anemia, Hypertension, Peripheral Arterial Disease, Peripheral Weeks Of Treatment: 14 History: Venous Disease, Raynauds, Scleroderma, Rheumatoid  Arthritis, Clustered Wound: No Osteoarthritis Wound Measurements Length: (cm) 0.8 Width: (cm) 0.7 Depth: (cm) 0.6 Area: (cm) 0.44 Volume: (cm) 0.264 % Reduction in Area: 0% % Reduction in Volume: -200% Epithelialization: Small (1-33%) Tunneling: No Undermining: No Wound Description Classification: Full Thickness Without Exposed Support Structures Wound Margin: Distinct, outline attached Exudate Amount: Small Exudate Type: Serous Exudate Color: amber Foul Odor After Cleansing: No Slough/Fibrino Yes Wound Bed Granulation Amount: Small (1-33%) Exposed Structure Granulation Quality: Pink Fascia Exposed: No Necrotic Amount: Large (67-100%) Fat Layer (Subcutaneous Tissue) Exposed: Yes Necrotic Quality: Adherent Slough Tendon Exposed: No Muscle Exposed: No Joint Exposed: No Bone Exposed: No Treatment Notes Wound #20 (Left, Lateral Malleolus) 2. Periwound Care Moisturizing lotion 3. Primary Dressing Applied Hydrogel or K-Y Jelly Other primary dressing (specifiy in notes) 4. Secondary Dressing Dry Gauze 6. Support Layer Applied 3 layer compression wrap Notes sorbact swab Electronic Signature(s) Signed: 08/08/2020 4:51:12 PM By: [name] Gouty RN, BSN Signed: 08/08/2020 5:19:36 PM By: Levan Hurst RN, BSN Entered By: [name] [name] on 08/08/2020 12:41:47 -------------------------------------------------------------------------------- Wound Assessment Details Patient Name: Date of Service: [name] [name], [name] 08/08/2020 11:15 A M Medical Record Number: [id number] Patient Account Number: [id number] Date of Birth/Sex: Treating RN: 1951-07-18 (69 y.o. [name] [name] Primary Care Sargun Rummell: [name] [name], [place] RY Other Clinician: Referring Jed Kutch: Treating Brandilyn Nanninga/Extender: [name] Hoover, MA RY Weeks in Treatment: 437 Wound Status Wound Number: 21 Primary Venous Leg Ulcer Etiology: Wound Location: Left, Lateral Lower Leg Wound Open Wounding Event: Gradually  Appeared Status: Date Acquired: 06/14/2020 Comorbid Anemia, Hypertension, Peripheral Arterial Disease, Peripheral Weeks Of Treatment: 7 History: Venous Disease, Raynauds, Scleroderma, Rheumatoid Arthritis, Clustered Wound: Yes Osteoarthritis Wound Measurements Length: (cm) 0.7 Width: (cm) 0.6 Depth: (cm) 0.3 Area: (cm) 0.33 Volume: (cm) 0.099 % Reduction in Area: 81.3% % Reduction in Volume: 44.1% Epithelialization: Large (67-100%) Tunneling: No Wound Description Classification: Full Thickness Without Exposed Support Structures Exudate Amount: None Present Foul Odor After Cleansing: No Slough/Fibrino No Wound Bed Granulation Amount: None Present (0%) Exposed Structure Necrotic Amount: None Present (0%) Fascia Exposed: No Fat Layer (Subcutaneous Tissue) Exposed: No Tendon Exposed: No Muscle Exposed: No Joint Exposed: No Bone Exposed: No Limited to Skin Breakdown Treatment Notes Wound #21 (Left, Lateral Lower Leg) 2. Periwound Care Moisturizing lotion 3. Primary Dressing Applied Hydrogel or K-Y Jelly Other primary dressing (specifiy in notes) 4. Secondary Dressing Dry Gauze 6. Support Layer Applied 3 layer compression wrap Notes sorbact swab Electronic Signature(s) Signed: 08/08/2020 4:51:12 PM By: [name] Gouty RN, BSN Signed: 08/08/2020 5:19:36 PM By: Levan Hurst RN, BSN Entered By: [name] [name] on 08/08/2020 12:42:42 -------------------------------------------------------------------------------- Wound Assessment Details Patient Name: Date of Service: [name] [name], [name] 08/08/2020 11:15 A M Medical Record Number: [id number] Patient Account Number: [id number] Date of Birth/Sex: Treating RN: 26-Aug-1951 (69 y.o. [name] [name] Primary Care Ronold Hardgrove: [name] [name], [place] RY Other Clinician: Referring Hasan [name]: Treating Artem Bunte/Extender: [name] Hoover, MA RY Weeks in Treatment: 437 Wound Status Wound Number: 5 Primary Venous Leg  Ulcer Etiology: Wound Location: Right, Medial Lower Leg Wound Open Wounding Event: Gradually Appeared Status: Date Acquired: 01/13/2013 Comorbid  Anemia, Hypertension, Peripheral Arterial Disease, Peripheral Weeks Of Treatment: 393 History: Venous Disease, Raynauds, Scleroderma, Rheumatoid Arthritis, Clustered Wound: Yes Osteoarthritis Wound Measurements Length: (cm) 2.1 Width: (cm) 2.1 Depth: (cm) 0.9 Area: (cm) 3.464 Volume: (cm) 3.117 % Reduction in Area: -145% % Reduction in Volume: -2110.6% Epithelialization: Small (1-33%) Tunneling: No Undermining: No Wound Description Classification: Full Thickness With Exposed Support Structures Wound Margin: Distinct, outline attached Exudate Amount: Medium Exudate Type: Serous Exudate Color: amber Foul Odor After Cleansing: No Slough/Fibrino Yes Wound Bed Granulation Amount: Small (1-33%) Exposed Structure Granulation Quality: Pink Fascia Exposed: No Necrotic Amount: Large (67-100%) Fat Layer (Subcutaneous Tissue) Exposed: Yes Necrotic Quality: Adherent Slough Tendon Exposed: No Muscle Exposed: No Joint Exposed: No Bone Exposed: No Treatment Notes Wound #5 (Right, Medial Lower Leg) 2. Periwound Care Moisturizing lotion 3. Primary Dressing Applied Hydrogel or K-Y Jelly Other primary dressing (specifiy in notes) 4. Secondary Dressing Dry Gauze 6. Support Layer Applied 3 layer compression wrap Notes sorbact swab Electronic Signature(s) Signed: 08/08/2020 4:51:12 PM By: [name] Gouty RN, BSN Signed: 08/08/2020 5:19:36 PM By: Levan Hurst RN, BSN Entered By: [name] [name] on 08/08/2020 12:43:55 -------------------------------------------------------------------------------- Vitals Details Patient Name: Date of Service: [name] [name], [name] 08/08/2020 11:15 A M Medical Record Number: [id number] Patient Account Number: [id number] Date of Birth/Sex: Treating RN: 02-Jul-1951 (69 y.o. [name] [name] Primary Care  Tria Noguera: [name] [name], [place] RY Other Clinician: Referring Otilia Kareem: Treating Eastin Swing/Extender: [name] Hoover, MA RY Weeks in Treatment: 437 Vital Signs Time Taken: 12:05 Temperature (F): 97.8 Height (in): 68 Pulse (bpm): 73 Weight (lbs): 132 Respiratory Rate (breaths/min): 19 Body Mass Index (BMI): 20.1 Blood Pressure (mmHg): 165/64 Reference Range: 80 - 120 mg / dl Electronic Signature(s) Signed: 08/09/2020 10:17:44 AM By: [name] [name] Entered By: [name] [name] on 08/08/2020 12:05:22

## 2020-08-15 ENCOUNTER — Encounter (HOSPITAL_BASED_OUTPATIENT_CLINIC_OR_DEPARTMENT_OTHER): Payer: Medicare Other | Admitting: Internal Medicine

## 2020-08-15 DIAGNOSIS — L97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: Secondary | ICD-10-CM | POA: Diagnosis not present

## 2020-08-15 DIAGNOSIS — L97322 Non-pressure chronic ulcer of left ankle with fat layer exposed: Secondary | ICD-10-CM | POA: Diagnosis not present

## 2020-08-15 DIAGNOSIS — L97811 Non-pressure chronic ulcer of other part of right lower leg limited to breakdown of skin: Secondary | ICD-10-CM | POA: Diagnosis not present

## 2020-08-15 DIAGNOSIS — L97828 Non-pressure chronic ulcer of other part of left lower leg with other specified severity: Secondary | ICD-10-CM | POA: Diagnosis not present

## 2020-08-15 DIAGNOSIS — L97229 Non-pressure chronic ulcer of left calf with unspecified severity: Secondary | ICD-10-CM | POA: Diagnosis not present

## 2020-08-15 DIAGNOSIS — L97829 Non-pressure chronic ulcer of other part of left lower leg with unspecified severity: Secondary | ICD-10-CM | POA: Diagnosis not present

## 2020-08-15 DIAGNOSIS — I83222 Varicose veins of left lower extremity with both ulcer of calf and inflammation: Secondary | ICD-10-CM | POA: Diagnosis not present

## 2020-08-15 DIAGNOSIS — I87331 Chronic venous hypertension (idiopathic) with ulcer and inflammation of right lower extremity: Secondary | ICD-10-CM | POA: Diagnosis not present

## 2020-08-15 DIAGNOSIS — L89619 Pressure ulcer of right heel, unspecified stage: Secondary | ICD-10-CM | POA: Diagnosis not present

## 2020-08-15 DIAGNOSIS — L97312 Non-pressure chronic ulcer of right ankle with fat layer exposed: Secondary | ICD-10-CM | POA: Diagnosis not present

## 2020-08-15 NOTE — Progress Notes (Addendum)
[name], [name] ([id number]) Visit Report for 08/15/2020 Arrival Information Details Patient Name: Date of Service: [name] 08/15/2020 11:00 A M Medical Record Number: [id number] Patient Account Number: [id number] Date of Birth/Sex: Treating RN: 1951-12-06 (69 y.o. F) Primary Care Devyn Sheerin: [name], [place] RY Other Clinician: Referring Aniqa Hare: Treating Emanuelle Bastos/Extender: [name], MA RY Weeks in Treatment: 44 Visit Information History Since Last Visit Added or deleted any medications: No Patient Arrived: Ambulatory Any new allergies or adverse reactions: No Arrival Time: 11:54 Had a fall or experienced change in No Accompanied By: husband activities of daily living that may affect Transfer Assistance: None risk of falls: Patient Identification Verified: Yes Signs or symptoms of abuse/neglect since last visito No Secondary Verification Process Completed: Yes Hospitalized since last visit: No Patient Requires Transmission-Based Precautions: No Implantable device outside of the clinic excluding No Patient Has Alerts: No cellular tissue based products placed in the center since last visit: Has Dressing in Place as Prescribed: Yes Pain Present Now: No Electronic Signature(s) Signed: 08/15/2020 2:40:03 PM By: Sandre Kitty Entered By: Sandre Kitty on 08/15/2020 11:55:03 -------------------------------------------------------------------------------- Clinic Level of Care Assessment Details Patient Name: Date of Service: [name]. 08/15/2020 11:00 A M Medical Record Number: [id number] Patient Account Number: [id number] Date of Birth/Sex: Treating RN: 1951-07-25 (69 y.o. [name] [name] Primary Care Zenia Guest: [name], [place] R[place] Other Clinician: Referring Tarence Searcy: Treating Edu On/Extender: [name], MA RY Weeks in Treatment: 438 Clinic Level of Care Assessment Items TOOL 4 Quantity Score X- 1 0 Use when only an EandM is  performed on FOLLOW-UP visit ASSESSMENTS - Nursing Assessment / Reassessment X- 1 10 Reassessment of Co-morbidities (includes updates in patient status) X- 1 5 Reassessment of Adherence to Treatment Plan ASSESSMENTS - Wound and Skin A ssessment / Reassessment _0  - 0 Simple Wound Assessment / Reassessment - one wound X- 5 5 Complex Wound Assessment / Reassessment - multiple wounds _1  - 0 Dermatologic / Skin Assessment (not related to wound area) ASSESSMENTS - Focused Assessment _2  - 0 Circumferential Edema Measurements - multi extremities _3  - 0 Nutritional Assessment / Counseling / Intervention X- 1 5 Lower Extremity Assessment (monofilament, tuning fork, pulses) _4  - 0 Peripheral Arterial Disease Assessment (using hand held doppler) ASSESSMENTS - Ostomy and/or Continence Assessment and Care _5  - 0 Incontinence Assessment and Management _6  - 0 Ostomy Care Assessment and Management (repouching, etc.) PROCESS - Coordination of Care X - Simple Patient / Family Education for ongoing care 1 15 _7  - 0 Complex (extensive) Patient / Family Education for ongoing care X- 1 10 Staff obtains Programmer, systems, Records, T Results / Process Orders est _8  - 0 Staff telephones HHA, Nursing Homes / Clarify orders / etc _9  - 0 Routine Transfer to another Facility (non-emergent condition) _10  - 0 Routine Hospital Admission (non-emergent condition) _11  - 0 New Admissions / Biomedical engineer / Ordering NPWT Apligraf, etc. , _12  - 0 Emergency Hospital Admission (emergent condition) X- 1 10 Simple Discharge Coordination _13  - 0 Complex (extensive) Discharge Coordination PROCESS - Special Needs _14  - 0 Pediatric / Minor Patient Management _15  - 0 Isolation Patient Management _16  - 0 Hearing / Language / Visual special needs _17  - 0 Assessment of Community assistance (transportation, D/C planning, etc.) _18  - 0 Additional assistance / Altered mentation _19  - 0 Support Surface(s) Assessment  (bed, cushion, seat, etc.) INTERVENTIONS - Wound Cleansing / Measurement _20  - 0 Simple Wound Cleansing - one  wound X- 5 5 Complex Wound Cleansing - multiple wounds X- 1 5 Wound Imaging (photographs - any number of wounds) _0  - 0 Wound Tracing (instead of photographs) _1  - 0 Simple Wound Measurement - one wound X- 5 5 Complex Wound Measurement - multiple wounds INTERVENTIONS - Wound Dressings _2  - 0 Small Wound Dressing one or multiple wounds _3  - 0 Medium Wound Dressing one or multiple wounds X- 2 20 Large Wound Dressing one or multiple wounds <BJYNWGNFAOZHYQMV>_7<\/QIONGEXBMWUXLKGM>_0  - 0 Application of Medications - topical <NUUVOZDGUYQIHKVQ>_2<\/VZDGLOVFIEPPIRJJ>_8  - 0 Application of Medications - injection INTERVENTIONS - Miscellaneous _6  - 0 External ear exam _7  - 0 Specimen Collection (cultures, biopsies, blood, body fluids, etc.) _8  - 0 Specimen(s) / Culture(s) sent or taken to Lab for analysis _9  - 0 Patient Transfer (multiple staff / Civil Service fast streamer / Similar devices) _10  - 0 Simple Staple / Suture removal (25 or less) _11  - 0 Complex Staple / Suture removal (26 or more) _12  - 0 Hypo / Hyperglycemic Management (close monitor of Blood Glucose) _13  - 0 Ankle / Brachial Index (ABI) - do not check if billed separately X- 1 5 Vital Signs Has the patient been seen at the hospital within the last three years: Yes Total Score: 180 Level Of Care: New/Established - Level 5 Electronic Signature(s) Signed: 08/15/2020 5:14:32 PM By: Levan Hurst RN, BSN Entered By: Levan Hurst on 08/15/2020 15:22:49 -------------------------------------------------------------------------------- Encounter Discharge Information Details Patient Name: Date of Service: [name], [name] 08/15/2020 11:00 A M Medical Record Number: [id number] Patient Account Number: [id number] Date of Birth/Sex: Treating RN: [birth year] (69 y.o. [name] [name] Primary Care Lulabelle Desta: [name], [place] R[place] Other Clinician: Referring Kadasia Kassing: Treating Cilicia Borden/Extender: [name], MA RY Weeks in Treatment: [phone] Encounter Discharge Information Items Discharge Condition: Stable Ambulatory Status: Ambulatory Discharge Destination: Home Transportation: Private Auto Accompanied By: self Schedule Follow-up Appointment: Yes Clinical Summary of Care: Patient Declined Electronic Signature(s) Signed: 08/15/2020 5:24:06 PM By: Kela Millin Entered By: Kela Millin on 08/15/2020 12:59:22 -------------------------------------------------------------------------------- Lower Extremity Assessment Details Patient Name: Date of Service: [name] CKDalbert Batman F. 08/15/2020 11:00 A M Medical Record Number: [id number] Patient Account Number: [id number] Date of Birth/Sex: Treating RN: 06/02/1951 (69 y.o. [name] [name] Primary Care Skyler Carel: [name], [place] R[place] Other Clinician: Referring Jerusalem Wert: Treating Kandee Escalante/Extender: [name], MA RY Weeks in Treatment: 438 Edema Assessment Assessed: [Left: No] [Right: No] Edema: [Left: Yes] [Right: Yes] Calf Left: Right: Point of Measurement: 36 cm From Medial Instep 34.5 cm 29 cm Ankle Left: Right: Point of Measurement: 10 cm From Medial Instep 21.5 cm 21.5 cm Vascular Assessment Pulses: Dorsalis Pedis Palpable: [Left:No] [Right:No] Electronic Signature(s) Signed: 08/15/2020 5:24:06 PM By: Kela Millin Entered By: Kela Millin on 08/15/2020 12:02:31 -------------------------------------------------------------------------------- Multi Wound Chart Details Patient Name: Date of Service: [name] 08/15/2020 11:00 A M Medical Record Number: [id number] Patient Account Number: [id number] Date of Birth/Sex: Treating RN: [birth year] (69 y.o. 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] Primary Etiology: [16:Anemia, Hypertension, Peripheral] [19:Anemia, Hypertension, Peripheral] [  20:Anemia, Hypertension, Peripheral] Comorbid History: [16:Arterial Disease, Peripheral Venous Disease, Raynauds, Scleroderma, Rheumatoid Arthritis, Osteoarthritis 01/04/2020] [19:Arterial Disease, Peripheral Venous Disease, Raynauds, Scleroderma, Rheumatoid Arthritis, Osteoarthritis  05/02/2020] [20:Arterial Disease, Peripheral Venous Disease, Raynauds, Scleroderma, Rheumatoid Arthritis, Osteoarthritis 05/02/2020] Date Acquired: [16:32] [19:15] [20:15] Weeks of Treatment: [16:Open] [19:Open] [20:Open] Wound Status: [16:No] [19:No] [20:No] Clustered Wound: [16:0.9x0.3x0.7] [19:0.2x0.2x0.3] [20:0.7x0.7x0.5] Measurements L x W x D (cm) [16:0.212] [19:0.031] [20:0.385] A (cm) : rea [16:0.148] [19:0.009] [20:0.192] Volume (cm) : [16:98.30%] [19:78.00%] [20:12.50%] % Reduction in Area: [16:88.20%] [19:67.90%] [20:-118.20%] % Reduction in Volume: [16:Unstageable/Unclassified] [19:Full Thickness Without Exposed] [20:Full Thickness Without Exposed] Classification: [16:Small] [19:Support Structures Small] [20:Support Structures Small] Exudate Amount: [16:Purulent] [19:Serous] [20:Purulent] Exudate Type: [16:yellow, brown, green] [19:amber] [20:yellow, brown, green] Exudate Color: [16:Flat and Intact] [19:Flat and Intact] [20:Well defined, not attached] Wound Margin: [16:Small (1-33%)] [19:None Present (0%)] [20:Small (1-33%)] Granulation Amount: [16:Pink] [19:N/A] [20:Pink] Granulation Quality: [16:Large (67-100%)]  [19:Large (67-100%)] [20:Large (67-100%)] Necrotic Amount: [16:Fat Layer (Subcutaneous Tissue)] [19:Fat Layer (Subcutaneous Tissue)] [20:Fat Layer (Subcutaneous Tissue)] Exposed Structures: [16:Exposed: Yes Fascia: No Tendon: No Muscle: No Joint: No Bone: No None] [19:Exposed: Yes Fascia: No Tendon: No Muscle: No Joint: No Bone: No None] [20:Exposed: Yes Fascia: No Tendon: No Muscle: No Joint: No Bone: No Small (1-33%)] Wound Number: _0 Photos: No Photos No Photos No Photos Left, Lateral Lower Leg Right, Posterior Lower Leg Right, Medial Lower Leg Wound Location: Gradually Appeared Gradually Appeared Gradually Appeared 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 Primary Etiology: Anemia, Hypertension, Peripheral Anemia, Hypertension, Peripheral Anemia, Hypertension, Peripheral Comorbid History: Arterial Disease, Peripheral Venous Arterial Disease, Peripheral Venous Arterial Disease, Peripheral Venous Disease, Raynauds, Scleroderma, Disease, Raynauds, Scleroderma, Disease, Raynauds, Scleroderma, Rheumatoid Arthritis, Osteoarthritis Rheumatoid Arthritis, Osteoarthritis Rheumatoid Arthritis, Osteoarthritis 06/14/2020 08/15/2020 01/13/2013 Date Acquired: 8 0 394 Weeks of Treatment: Healed - Epithelialized Open Open Wound Status: Yes No Yes Clustered Wound: 0x0x0 1.3x1.1x0.1 2x1.6x0.3 Measurements L x W x D (cm) 0 1.123 2.513 A (cm) : rea 0 0.112 0.754 Volume (cm) : 100.00% N/A -77.70% % Reduction in Area: 100.00% N/A -434.80% % Reduction in Volume: Full Thickness Without Exposed Unclassifiable Full Thickness With Exposed Support Classification: Support Structures Structures None Present Small Medium Exudate Amount: N/A Purulent Purulent Exudate Type: N/A yellow, brown, green yellow, brown, green Exudate Color: Distinct, outline attached Distinct, outline attached Distinct, outline attached Wound Margin: None Present (0%) None Present (0%) Small  (1-33%) Granulation Amount: N/A N/A Pink Granulation Quality: None Present (0%) Large (67-100%) Large (67-100%) Necrotic Amount: Fascia: No Fat Layer (Subcutaneous Tissue) Fat Layer (Subcutaneous Tissue) Exposed Structures: Fat Layer (Subcutaneous Tissue) Exposed: Yes Exposed: Yes Exposed: No Fascia: No Fascia: No Tendon: No Tendon: No Tendon: No Muscle: No Muscle: No Muscle: No Joint: No Joint: No Joint: No Bone: No Bone: No Bone: No Large (67-100%) None Small (1-33%) Epithelialization: Treatment Notes Wound #16 (Right Calcaneus) 1. Cleanse With Wound Cleanser Soap and water 2. Periwound Care Moisturizing lotion 3. Primary Dressing Applied Santyl Other primary dressing (specifiy in notes) 4. Secondary Dressing Dry Gauze 6. Support Layer Applied Kerlix/Coban Notes santyl to left lateral malleolus and sorbact to other sites Wound #19 (Right, Lateral Malleolus) 1. Cleanse With Wound Cleanser Soap and water 2. Periwound Care Moisturizing lotion 3. Primary Dressing Applied Santyl Other primary dressing (specifiy in notes) 4. Secondary Dressing Dry Gauze 6. Support Layer Applied Kerlix/Coban Notes santyl to left lateral malleolus and sorbact to other sites Wound #20 (Left, Lateral Malleolus) 1. Cleanse With Wound Cleanser Soap  and water 2. Periwound Care Moisturizing lotion 3. Primary Dressing Applied Santyl Other primary dressing (specifiy in notes) 4. Secondary Dressing Dry Gauze 6. Support Layer Applied Kerlix/Coban Notes santyl to left lateral malleolus and sorbact to other sites Wound #23 (Right, Posterior Lower Leg) 1. Cleanse With Wound Cleanser Soap and water 2. Periwound Care Moisturizing lotion 3. Primary Dressing Applied Santyl Other primary dressing (specifiy in notes) 4. Secondary Dressing Dry Gauze 6. Support Layer Applied Kerlix/Coban Notes santyl to left lateral malleolus and sorbact to other sites Wound #5 (Right,  Medial Lower Leg) 1. Cleanse With Wound Cleanser Soap and water 2. Periwound Care Moisturizing lotion 3. Primary Dressing Applied Santyl Other primary dressing (specifiy in notes) 4. Secondary Dressing Dry Gauze 6. Support Layer Applied Kerlix/Coban Notes santyl to left lateral malleolus and sorbact to other sites Electronic Signature(s) Signed: 08/15/2020 5:53:04 PM By: Linton Ham MD Entered By: Linton Ham on 08/15/2020 17:39:09 -------------------------------------------------------------------------------- Multi-Disciplinary Care Plan Details Patient Name: Date of Service: [name], [name] 08/15/2020 11:00 A M Medical Record Number: [id number] Patient Account Number: [id number] Date of Birth/Sex: Treating RN: 11-Jul-1951 (69 y.o. [name] [name] Primary Care Manoah Deckard: [name], [place] R[place] Other Clinician: Referring Lanis Storlie: Treating Gareld Obrecht/Extender: [name], MA RY Weeks in Treatment: 438 Active Inactive Wound/Skin Impairment Nursing Diagnoses: Impaired tissue integrity Knowledge deficit related to ulceration/compromised skin integrity Goals: Patient/caregiver will verbalize understanding of skin care regimen Date Initiated: 10/11/2016 Target Resolution Date: 09/16/2020 Goal Status: Active Interventions: Assess patient/caregiver ability to obtain necessary supplies Assess patient/caregiver ability to perform ulcer/skin care regimen upon admission and as needed Assess ulceration(s) every visit Provide education on ulcer and skin care Treatment Activities: Skin care regimen initiated : 10/11/2016 Topical wound management initiated : 10/11/2016 Notes: Electronic Signature(s) Signed: 08/15/2020 5:14:32 PM By: Levan Hurst RN, BSN Entered By: Levan Hurst on 08/15/2020 15:04:28 -------------------------------------------------------------------------------- Pain Assessment Details Patient Name: Date of Service: [name]  08/15/2020 11:00 A M Medical Record Number: [id number] Patient Account Number: [id number] Date of Birth/Sex: Treating RN: [birth year] (69 y.o. F) Primary Care Preethi Scantlebury: [name], [place] R[place] Other Clinician: Referring Hadi Dubin: Treating Dekayla Prestridge/Extender: [name], MA RY Weeks in Treatment: [phone] Active Problems Location of Pain Severity and Description of Pain Patient Has Paino No Site Locations Pain Management and Medication Current Pain Management: Electronic Signature(s) Signed: 08/15/2020 2:40:03 PM By: Sandre Kitty Entered By: Sandre Kitty on 08/15/2020 11:55:30 -------------------------------------------------------------------------------- Patient/Caregiver Education Details Patient Name: Date of Service: [name], [name] [name] 8/16/2021andnbsp11:00 A M Medical Record Number: [id number] Patient Account Number: [id number] Date of Birth/Gender: Treating RN: January 30, 1951 (69 y.o. [name] [name] Primary Care Physician: [name], [place] R[place] Other Clinician: Referring Physician: Treating Physician/Extender: [name], MA RY Weeks in Treatment: [phone] Education Assessment Education Provided To: Patient Education Topics Provided Wound/Skin Impairment: Methods: Explain/Verbal Responses: State content correctly Electronic Signature(s) Signed: 08/15/2020 5:14:32 PM By: Levan Hurst RN, BSN Entered By: Levan Hurst on 08/15/2020 15:06:31 -------------------------------------------------------------------------------- Wound Assessment Details Patient Name: Date of Service: [name] 08/15/2020 11:00 A M Medical Record Number: [id number] Patient Account Number: [id number] Date of Birth/Sex: Treating RN: [birth year] (69 y.o. [name] [name] Primary Care Deone Leifheit: [name], [place] R[place] Other Clinician: Referring Domanick Cuccia: Treating Gael Delude/Extender: [name], MA RY Weeks in Treatment: 438 Wound Status Wound Number: 16 Primary  Pressure Ulcer Etiology: Wound Location: Right Calcaneus Wound Open Wounding Event: Pressure Injury  Status: Date Acquired: 01/04/2020 Comorbid Anemia, Hypertension, Peripheral Arterial Disease, Peripheral Weeks Of Treatment: 32 History: Venous Disease, Raynauds, Scleroderma, Rheumatoid Arthritis, Clustered Wound: No Osteoarthritis Wound Measurements Length: (cm) 0.9 Width: (cm) 0.3 Depth: (cm) 0.7 Area: (cm) 0.212 Volume: (cm) 0.148 % Reduction in Area: 98.3% % Reduction in Volume: 88.2% Epithelialization: None Tunneling: No Undermining: No Wound Description Classification: Unstageable/Unclassified Wound Margin: Flat and Intact Exudate Amount: Small Exudate Type: Purulent Exudate Color: yellow, brown, green Foul Odor After Cleansing: No Slough/Fibrino Yes Wound Bed Granulation Amount: Small (1-33%) Exposed Structure Granulation Quality: Pink Fascia Exposed: No Necrotic Amount: Large (67-100%) Fat Layer (Subcutaneous Tissue) Exposed: Yes Necrotic Quality: Adherent Slough Tendon Exposed: No Muscle Exposed: No Joint Exposed: No Bone Exposed: No Treatment Notes Wound #16 (Right Calcaneus) 1. Cleanse With Wound Cleanser Soap and water 2. Periwound Care Moisturizing lotion 3. Primary Dressing Applied Santyl Other primary dressing (specifiy in notes) 4. Secondary Dressing Dry Gauze 6. Support Layer Applied Kerlix/Coban Notes santyl to left lateral malleolus and sorbact to other sites Electronic Signature(s) Signed: 08/15/2020 5:24:06 PM By: Kela Millin Entered By: Kela Millin on 08/15/2020 12:05:29 -------------------------------------------------------------------------------- Wound Assessment Details Patient Name: Date of Service: [name] 08/15/2020 11:00 A M Medical Record Number: [id number] Patient Account Number: [id number] Date of Birth/Sex: Treating RN: 01-02-1951 (69 y.o. [name] [name] Primary Care Yaire Kreher: [name], [place]  RY Other Clinician: Referring Saahas Hidrogo: Treating Blandina Renaldo/Extender: [name], MA RY Weeks in Treatment: 438 Wound Status Wound Number: 19 Primary Venous Leg Ulcer Etiology: Wound Location: Right, Lateral Malleolus Wound Open Wounding Event: Gradually Appeared Status: Date Acquired: 05/02/2020 Comorbid Anemia, Hypertension, Peripheral Arterial Disease, Peripheral Weeks Of Treatment: 15 History: Venous Disease, Raynauds, Scleroderma, Rheumatoid Arthritis, Clustered Wound: No Osteoarthritis Wound Measurements Length: (cm) 0.2 Width: (cm) 0.2 Depth: (cm) 0.3 Area: (cm) 0.031 Volume: (cm) 0.009 % Reduction in Area: 78% % Reduction in Volume: 67.9% Epithelialization: None Tunneling: No Undermining: No Wound Description Classification: Full Thickness Without Exposed Support Structures Wound Margin: Flat and Intact Exudate Amount: Small Exudate Type: Serous Exudate Color: amber Foul Odor After Cleansing: No Slough/Fibrino Yes Wound Bed Granulation Amount: None Present (0%) Exposed Structure Necrotic Amount: Large (67-100%) Fascia Exposed: No Necrotic Quality: Adherent Slough Fat Layer (Subcutaneous Tissue) Exposed: Yes Tendon Exposed: No Muscle Exposed: No Joint Exposed: No Bone Exposed: No Treatment Notes Wound #19 (Right, Lateral Malleolus) 1. Cleanse With Wound Cleanser Soap and water 2. Periwound Care Moisturizing lotion 3. Primary Dressing Applied Santyl Other primary dressing (specifiy in notes) 4. Secondary Dressing Dry Gauze 6. Support Layer Applied Kerlix/Coban Notes santyl to left lateral malleolus and sorbact to other sites Electronic Signature(s) Signed: 08/15/2020 5:24:06 PM By: Kela Millin Entered By: Kela Millin on 08/15/2020 12:06:27 -------------------------------------------------------------------------------- Wound Assessment Details Patient Name: Date of Service: [name], [name] 08/15/2020 11:00 A  M Medical Record Number: [id number] Patient Account Number: [id number] Date of Birth/Sex: Treating RN: [birth year] (69 y.o. [name] [name] Primary Care Kammi Hechler: [name], [place] R[place] Other Clinician: Referring Yitta Gongaware: Treating Phung Kotas/Extender: [name], MA RY Weeks in Treatment: 438 Wound Status Wound Number: 20 Primary Arterial Insufficiency Ulcer Etiology: Wound Location: Left, Lateral Malleolus Wound Open Wounding Event: Gradually Appeared Status: Date Acquired: 05/02/2020 Comorbid Anemia, Hypertension, Peripheral Arterial Disease, Peripheral Weeks Of Treatment: 15 History: Venous Disease, Raynauds, Scleroderma, Rheumatoid Arthritis, Clustered Wound: No Osteoarthritis Wound Measurements Length: (cm) 0.7 Width: (cm) 0.7 Depth: (cm) 0.5 Area: (cm) 0.385 Volume: (cm) 0.192 %  Reduction in Area: 12.5% % Reduction in Volume: -118.2% Epithelialization: Small (1-33%) Tunneling: No Undermining: No Wound Description Classification: Full Thickness Without Exposed Support Structures Wound Margin: Well defined, not attached Exudate Amount: Small Exudate Type: Purulent Exudate Color: yellow, brown, green Foul Odor After Cleansing: No Slough/Fibrino Yes Wound Bed Granulation Amount: Small (1-33%) Exposed Structure Granulation Quality: Pink Fascia Exposed: No Necrotic Amount: Large (67-100%) Fat Layer (Subcutaneous Tissue) Exposed: Yes Necrotic Quality: Adherent Slough Tendon Exposed: No Muscle Exposed: No Joint Exposed: No Bone Exposed: No Treatment Notes Wound #20 (Left, Lateral Malleolus) 1. Cleanse With Wound Cleanser Soap and water 2. Periwound Care Moisturizing lotion 3. Primary Dressing Applied Santyl Other primary dressing (specifiy in notes) 4. Secondary Dressing Dry Gauze 6. Support Layer Applied Kerlix/Coban Notes santyl to left lateral malleolus and sorbact to other sites Electronic Signature(s) Signed: 08/15/2020 5:24:06 PM By:  Kela Millin Entered By: Kela Millin on 08/15/2020 12:07:17 -------------------------------------------------------------------------------- Wound Assessment Details Patient Name: Date of Service: [name] 08/15/2020 11:00 A M Medical Record Number: [id number] Patient Account Number: [id number] Date of Birth/Sex: Treating RN: 1951/12/02 (69 y.o. [name] [name] Primary Care Amedio Bowlby: [name], [place] R[place] Other Clinician: Referring Laylamarie Meuser: Treating Deania Siguenza/Extender: [name], MA RY Weeks in Treatment: 438 Wound Status Wound Number: 21 Primary Venous Leg Ulcer Etiology: Wound Location: Left, Lateral Lower Leg Wound Healed - Epithelialized Wounding Event: Gradually Appeared Status: Date Acquired: 06/14/2020 Comorbid Anemia, Hypertension, Peripheral Arterial Disease, Peripheral Weeks Of Treatment: 8 History: Venous Disease, Raynauds, Scleroderma, Rheumatoid Arthritis, Clustered Wound: Yes Osteoarthritis Wound Measurements Length: (cm) Width: (cm) Depth: (cm) Area: (cm) Volume: (cm) 0 % Reduction in Area: 100% 0 % Reduction in Volume: 100% 0 Epithelialization: Large (67-100%) 0 Tunneling: No 0 Undermining: No Wound Description Classification: Full Thickness Without Exposed Support Structures Wound Margin: Distinct, outline attached Exudate Amount: None Present Foul Odor After Cleansing: No Slough/Fibrino No Wound Bed Granulation Amount: None Present (0%) Exposed Structure Necrotic Amount: None Present (0%) Fascia Exposed: No Fat Layer (Subcutaneous Tissue) Exposed: No Tendon Exposed: No Muscle Exposed: No Joint Exposed: No Bone Exposed: No Electronic Signature(s) Signed: 08/15/2020 5:24:06 PM By: Kela Millin Entered By: Kela Millin on 08/15/2020 12:08:01 -------------------------------------------------------------------------------- Wound Assessment Details Patient Name: Date of Service: [name]  08/15/2020 11:00 A M Medical Record Number: [id number] Patient Account Number: [id number] Date of Birth/Sex: Treating RN: 08-16-1951 (69 y.o. [name] [name] Primary Care Sanaiya Welliver: [name], [place] RY Other Clinician: Referring Isacc Turney: Treating Keyon Liller/Extender: [name], MA RY Weeks in Treatment: 438 Wound Status Wound Number: 23 Primary Venous Leg Ulcer Etiology: Wound Location: Right, Posterior Lower Leg Wound Open Wounding Event: Gradually Appeared Status: Date Acquired: 08/15/2020 Comorbid Anemia, Hypertension, Peripheral Arterial Disease, Peripheral Weeks Of Treatment: 0 History: Venous Disease, Raynauds, Scleroderma, Rheumatoid Arthritis, Clustered Wound: No Osteoarthritis Wound Measurements Length: (cm) 1.3 Width: (cm) 1.1 Depth: (cm) 0.1 Area: (cm) 1.123 Volume: (cm) 0.112 % Reduction in Area: % Reduction in Volume: Epithelialization: None Tunneling: No Undermining: No Wound Description Classification: Unclassifiable Wound Margin: Distinct, outline attached Exudate Amount: Small Exudate Type: Purulent Exudate Color: yellow, brown, green Foul Odor After Cleansing: No Slough/Fibrino Yes Wound Bed Granulation Amount: None Present (0%) Exposed Structure Necrotic Amount: Large (67-100%) Fascia Exposed: No Necrotic Quality: Adherent Slough Fat Layer (Subcutaneous Tissue) Exposed: Yes Tendon Exposed: No Muscle Exposed: No Joint Exposed: No Bone Exposed: No Treatment Notes Wound #23 (Right, Posterior Lower Leg) 1. Cleanse With Wound Cleanser  Soap and water 2. Periwound Care Moisturizing lotion 3. Primary Dressing Applied Santyl Other primary dressing (specifiy in notes) 4. Secondary Dressing Dry Gauze 6. Support Layer Applied Kerlix/Coban Notes santyl to left lateral malleolus and sorbact to other sites Electronic Signature(s) Signed: 08/15/2020 5:24:06 PM By: Kela Millin Entered By: Kela Millin on 08/15/2020  12:09:17 -------------------------------------------------------------------------------- Wound Assessment Details Patient Name: Date of Service: [name] 08/15/2020 11:00 A M Medical Record Number: [id number] Patient Account Number: [id number] Date of Birth/Sex: Treating RN: March 06, 1951 (68 y.o. [name] [name] Primary Care Yuna Pizzolato: [name], [place] RY Other Clinician: Referring Zanden Colver: Treating Darrius Montano/Extender: [name], MA RY Weeks in Treatment: 438 Wound Status Wound Number: 5 Primary Venous Leg Ulcer Etiology: Wound Location: Right, Medial Lower Leg Wound Open Wounding Event: Gradually Appeared Status: Date Acquired: 01/13/2013 Comorbid Anemia, Hypertension, Peripheral Arterial Disease, Peripheral Weeks Of Treatment: 394 History: Venous Disease, Raynauds, Scleroderma, Rheumatoid Arthritis, Clustered Wound: Yes Osteoarthritis Wound Measurements Length: (cm) 2 Width: (cm) 1.6 Depth: (cm) 0.3 Area: (cm) 2.513 Volume: (cm) 0.754 % Reduction in Area: -77.7% % Reduction in Volume: -434.8% Epithelialization: Small (1-33%) Tunneling: No Undermining: No Wound Description Classification: Full Thickness With Exposed Support Structures Wound Margin: Distinct, outline attached Exudate Amount: Medium Exudate Type: Purulent Exudate Color: yellow, brown, green Foul Odor After Cleansing: No Slough/Fibrino Yes Wound Bed Granulation Amount: Small (1-33%) Exposed Structure Granulation Quality: Pink Fascia Exposed: No Necrotic Amount: Large (67-100%) Fat Layer (Subcutaneous Tissue) Exposed: Yes Necrotic Quality: Adherent Slough Tendon Exposed: No Muscle Exposed: No Joint Exposed: No Bone Exposed: No Treatment Notes Wound #5 (Right, Medial Lower Leg) 1. Cleanse With Wound Cleanser Soap and water 2. Periwound Care Moisturizing lotion 3. Primary Dressing Applied Santyl Other primary dressing (specifiy in notes) 4. Secondary Dressing Dry  Gauze 6. Support Layer Applied Kerlix/Coban Notes santyl to left lateral malleolus and sorbact to other sites Electronic Signature(s) Signed: 08/15/2020 5:24:06 PM By: Kela Millin Entered By: Kela Millin on 08/15/2020 12:04:14 -------------------------------------------------------------------------------- Vitals Details Patient Name: Date of Service: [name], [name] 08/15/2020 11:00 A M Medical Record Number: [id number] Patient Account Number: [id number] Date of Birth/Sex: Treating RN: 10-Aug-1951 (69 y.o. F) Primary Care Kinlie Janice: [name], [place] RY Other Clinician: Referring Junaid Wurzer: Treating Angelik Walls/Extender: [name], MA RY Weeks in Treatment: 438 Vital Signs Time Taken: 11:55 Temperature (F): 97.8 Height (in): 68 Pulse (bpm): 67 Weight (lbs): 132 Respiratory Rate (breaths/min): 19 Body Mass Index (BMI): 20.1 Blood Pressure (mmHg): 186/61 Reference Range: 80 - 120 mg / dl Electronic Signature(s) Signed: 08/15/2020 2:40:03 PM By: Sandre Kitty Entered By: Sandre Kitty on 08/15/2020 11:55:22

## 2020-08-15 NOTE — Progress Notes (Signed)
[name] [name], [name] [name] ([id number]) Visit Report for 08/15/2020 HPI Details Patient Name: Date of Service: [name] 08/15/2020 11:00 A M Medical Record Number: [id number] Patient Account Number: [id number] Date of Birth/Sex: Treating RN: [birth year] (69 y.o. F) Primary Care Provider: [name], [place] RY Other Clinician: Referring Provider: Treating Provider/Extender: [name], MA RY Weeks in Treatment: 438 History of Present Illness HPI Description: this is a patient who initially came to [place] for wounds on the medial malleoli bilaterally as well as her upper medial lower extremities bilaterally. These wounds eventually healed with assistance of Apligraf's bilaterally. While this was occurring she developed the current wound which opened into a fairly substantial wound on the right lower extremity. These are mostly secondary to venous stasis physiology however the patient also has underlying scleroderma, pulmonary hypertension. The wound has been making good progress lately with the Hydrofera Blue-based dressing. 03/17/2015; patient had a arterial evaluation a year ago. Her right ABI was 0.86 left was 1.0. T brachial index was 0.41 on the right 0.45 on the left. Her oe bilateral common femoral artery waveforms were triphasic. Her white popliteal posterior tibial artery and anterior tibial artery waveforms were monophasic with good amplitude. Luteal artery waveforms were biphasic it was felt that her bilateral great toe pressures are of normal although adequate for tissue healing. 03/24/2015. The condition of this wound is not really improved that. He was covered with as fibrinous surface slough and eschar. This underwent an aggressive debridement with both a curette and scalpel. I still cannot really get down to what I can would consider to be a viable surface. There is no evidence of infection. Previous workup for ischemia roughly a year ago was negative nevertheless I think that  continues to be a concern 04/07/15. The patient arrived for application of second Apligraf. Once again the surface of this wound is certainly less viable than I would like for an advanced treatment option. An aggressive debridement was done. She developed some arteriolar bleeding which required that along pressure and silver alginate. 04/21/15: change in the condition of this wound. Once again it is covered in a gelatinous surface slough. After debridement today surface of the wound looks somewhat better but now a heeling surface 05/05/15 Apligraf #4 applied.Still a lot of slough on this wound. 05/19/15 Apligraf #5 applied. Still a lot of slough on this wound I did not aggressively remove this 06/02/15; continued copious amounts of surface slough. This debridement fairly easily. 06/08/2015 -- the last time she had a venous duplex study done was over 3 years ago and the surgery was prior to that. I have recommended that she sees Dr. early for a another opinion regarding a repeat venous duplex and possibly more endovenous ablation of vein stripping of micro-phlebotomies. 06/16/15; wound has a gelatinous surface eschar that the debridement fairly easily to a point. I don't disagree with the venous workup and perhaps even arterial re-evaluation. She is on prednisone 5 mg and continue his medications for her pulmonary artery hypertension I am not sure if the latter have any wound care healing issues I would need to investigate this. 06/23/15 continues with a gelatinous surface eschar with of fibrinous underlying. What I can see of this wound does not look unhealthy however I just can't get this material which I think is 2 different layers off. Empiric culture done 06/30/15; unfortunately not a lot of change in this wound. A gelatinous surface eschar is easily removed however it has  a tight fibrinous surface underneath the. 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 The wound comes back and basically  and unchanged state the. She has a gelatinous surface that is more easily removed however there is a tightly fibrinous surface underneath the. There is no evidence of infection. She has a vascular follow-up next month. I would have to inject her in order to do a more aggressive debridement of this area 07/21/15 the wound is roughly in the same state albeit the debridement is done with greater ease. There is less of the fibrinous eschar underneath the. There is no evidence of infection. She has follow-up with vascular surgery next week. No evidence of surrounding infection. Her original distal wounds healed while this one formed. 07/28/15; wound is easier to debride. No wound erythema. She is seeing Dr. [name] next week. 08/18/15 Has been to [place] for repeat ablation. Have been using medihoney pad with some improvement 08/25/15; absolutely no change in the condition of this wound in either its overall size or surface condition in many months now. At one point I had this down to [place] healthier surface I think with [facility] however this did not actually progress towards closure. Do not believe that the wound has actually deteriorated in terms of volume at all. We have been using a medihoney pad which allows easier debridement of the gelatinous eschar but again no overall actual improvement. the patient is going towards an ablation with pain and vascular which I think is scheduled for next week. The only other investigation that I could first see would be a biopsy. She does have underlying scleroderma 09/02/15 eschar is much easier to debridement however the base of this does not look particularly vibrant. We changed to Iodoflex. The patient had her ablation earlier this week 09/09/15 again the debridement over the base of this wound is easier and the base of the wound looks considerably better. We will continue the Iodoflex. Dr. Tawni Millers has expressed his satisfaction with the result of her  ablation 09/15/15 once again the wound is relatively free of surface eschar. No debridement was done today. It has a pale-looking base to it. although this is not as deep as it once was it seems to be expanding especially inferiorly. She has had recent venous ablation but this is no closer to healing.I've gone ahead and done a punch biopsy this from the inferior part of the wound close to normal skin 09/22/15: the wound is relatively free of surface eschar. There is some surrounding eschar. I'm not exactly sure at what level the surface is that I am seeing. 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 No evidence of atypical infection, malignancy. 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 No debridement was done 09/29/15; the wound surface is relatively free of surface eschar. There is eschar surrounding the walls of the wound. Aside from the improvement in the amount of surface slough. This wound has not progressed any towards closure. There is not even a surface that looks like there at this is ready. There is no evidence of any infection nor maligancy based on biopsy I did on 9/15. I continued with the Iodoflex however I am looking towards some alternative to try to promote some closure or filling in of this surface. Consider triple layer Oasis. Collagen did not result in adequate control of the surface slough 10/13/15; the patient was in hospital last week with severe anemia. The wound  looks somewhat better after debridement although there is widening medially. There is no evidence of infection. 10/20/15; patient's wound on the right lateral lower leg is essentially unchanged. This underwent a light surface debridement and in general the debridement is easier and the surface looks improved. I noted in doing this on the side of the wound what appears to be a piece of calcium deposition. The patient noted that she had previous things on the tips of  her fingers. 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 She follows with rheumatology and I have asked them to talk to her about this. In view of that S the nonhealing ulcer may have something to do with calcinosis and also unrelieved Raynaud's disease in this area. I should note that her original wounds on the right and left medial malleolus and the inner aspects of both legs just below the knees did however heal over 10/30/15; the patient's wound on the right lateral lower leg is essentially unchanged. I was able to remove some calcified material from the medial wound edge. 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 Otherwise the wound appears essentially unchanged there is less adherent surface eschar. Some of the calcified material was sent to pathology for analysis 11/17/15. The patient's wound on the right lateral leg is essentially unchanged. Wider Medially. 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 There is some surface slough but I did not debridement this. No evidence of surrounding infection. 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 This will make healing difficult 12/15/15; the wound has less depth surface has a fibrinous slough and calcifications in the wound edge no evidence of infection 12/22/15; the wound definitely has less Fibrinous slough on the base. Calcifications around the wound edges are still evident. Although the wound bed looks healthier it is still pale in appearance. Previous biopsy did not show malignancy 01/04/15; surgical debridement of nonviable slough and  subcutaneous tissue the wound cleans up quite nicely but appears to be expanding outward calcifications around the wound edges are still there. 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 She is to see her rheumatologist I'll see if he has any opinions on this. My punch biopsy done in September did not show calcifications although these are clearly evident. 01/19/16 light selective debridement of nonviable surface slough. There is epithelialization medially. This gives me reason for cautious optimism. She has been to see her rheumatologist, there is nothing that can be done for this type of soft tissue calcification associated with scleroderma 02/02/16 no debridement although there is a light surface slough. She has 2 peninsulas of skin 1 inferiorly and one medially. We continue to make a slight and slow but definite progressive here 02/16/16; light surface debridement with more attention to the circumference of the wound bed where the fibrinous eschar is more prevalent. No calcifications detected. She seems to have done nicely with the [facility] with some epithelialization and some improvement in the overall wound volume. 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 Continued attention  to the circumference of the wound where the fibrinous eschar in calcinosis or prevalent. No calcifications were detected. She has continued improvement with Hydrofera Blue. The wound is no longer as deep 03/15/16 surgical debridement done to remove surface escha Especially around the circumference of the wound where there is nonviable subcutaneous tissue. In spite of this there is considerable improvement in the overall dimensions and depth of the wound. Islands of epithelialization are seen especially medially inferiorly and superiorly to a lesser extent.  She is using Hydrofera Blue at home 03/29/16; surgical debridement done to remove surface eschar and nonviable subcutaneous tissue. This cleans up quite nicely mention slightly larger in terms of length and width however depth is less 04/12/16; continued gradual improvement in terms of depth and the condition of the wound base. Debridement is done. Continuing long standing Hydrofera Blue at home with Kerlix Coban wraps 04/26/16; continued gradual improvement in terms of depth and management as well as condition of the wound base. Surgical debridement done she continues with Hydrofera Blue. 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 She initially came to this clinic venous insufficiency ulcers which have long since healed 05/17/16 continued improvement in terms of the depth and measurements of this wound although she has a tightly adherent fibrinous slough each time. We've been continued with long standing Hydrofera Blue which seems to done as well for this wound is many advanced treatment options. 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 She also has chronic venous insufficiency. She has an irritation on her lateral right ankle secondary to our wraps 05/10/16; wound appears to be smaller especially on the medial aspect and especially in the width. Wound was debridement surface looks better. She is also been in the hospital apparently with anemia again she tells me she had an endoscopy. Since she got home after 3 days which I believe was sometime last week she has had an irritated painful area on the right lateral ankle surrounding the lateral malleolus 05/31/16; much more adherent surface slough today then recently although I don't think the dimensions of changed that much. A more aggressive debridement is required. The irritated area over her medial malleolus is more pruritic and painful and I don't think represents  cellulitis 06/14/16; no major change in her wound dimensions however there is more tightly adherent surface slough which is disappointing. As well as she appears to have a new small area medially. Furthermore an irritated uncomfortable area on the lateral aspect of her right foot just below the lateral malleolus. 06/21/16; I'm seeing the patient back in follow-up for the new areas under her major wound on the right anterior leg. She has been using Hydrofera Blue to this area probably for several months now and although the dressings seem to be helping for quite a period of time I think things have stagnated lately. She comes in today with a relatively tight adherent surface slough and really no changes in the wound shape or dimension. The 2 small areas she had inferiorly are tiny but still open they seem improved this well. There is no uncontrolled edema and I don't think there is any evidence of cellulitis. 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 Patient has had vascular evaluations both venous and arterial. I have biopsied this area. There is no obvious infection. The worrisome thing today  is that she seems to be developing areas of erythema and epithelial damage on the medial aspect of the right foot. Also to a lesser degree inferior to the actual wound itself. Again I see no obvious changes to suggest cellulitis however as this is the only treatable option I will probably give her antibiotics. 07/13/16 no major change in the lady's large anterior right leg wound. Still covered with a very tightly adherent surface slough which is difficult to debridement. There is less erythema around this, culture last week grew pseudomonas I gave her ciprofloxacin. The area on her lateral right malleolus looks better- 08/02/16 the patient's wounds continued to decline. Her original large anterior right leg  wound looks deeper. Still adherent surface slough that is difficult to debridement. She has a small area just below this and a punched-out wound just below her lateral malleolus. In the meantime she is been in hospital with apparently an upper GI bleed on Plavix and aspirin. She is now just on Plavix she received 3 units of packed cells 08/23/16; since I last saw this 3 weeks ago, the open large area on her right leg looks about the same syrup. She has a small satellite lesion just underneath this. The area on her medial right ankle is now a deep necrotic wound. I attempted to debridement this however there is just too much pain. 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 She follows with vascular surgery and is scheduled for an angiogram in early September 09/06/16; the patient is going for an arteriogram tomorrow. Her original large wound on the right calf is about the same the satellite lesion underneath it is about the same however the area on her medial ankle is now deeper with exposed tendon. I am no longer attempting to debride these wounds 09/20/16; the patient has undergone a right femoral endarterectomy and Dacron patch angioplasty. This seems to have helped the flow in her right leg. 10/04/16; Arrived today for aggressive debridement of the wounds on her right calf the original wound the one beneath it and a difficult area over her right lateral leg just above the lateral malleolus. 10/25/16; her 3 open wounds are about the same in terms of dimensions however the surface appears a lot healthier post debridement. Using Iodoflex 10/18/16 we have been using Iodoflex to her wounds which she tolerated with some difficulty. 10/11/16; has been using Santyl for a period of time with some improvement although again very adherent surface slough would prevent any attempted healing this. She has a original wound on the left calf, the  satellite underneath that and the most recent wound on the right medial ankle. She has completed revascularization by Dr. early and has had venous ablation earlier. Want to go back to Iodoflex to see if week and get a healthier surface to this wound bed failing this I think she'll need to be taken to the OR and I am prepared to call Dr. Marla Roe to discuss this. She is obviously not a good candidate for general anesthesia however.; 11/08/16; I put her on Iodoflex last time to see if I can get the wound bed any healthier and unfortunately today still had tremendous surface slough. 11/15/16; 4 weeks' worth of Iodoflex with not much improvement. Debridement on the major wounds on her left anterior leg is easier however this does not maintain from week to week. The punched-out area on her medial right ankle 11/29/16;  I attempted to change the patient last visit to [facility] however she states this burned and was very uncomfortable therefore we gave her permission to go back to the eye out of complex which she already had at home. Also she noted a lot of pain and swelling on the lateral aspect of her leg before she traveled to [facility] for the holidays, I called her in doxycycline over the phone. This seems to have helped 12/06/16; Wounds unchanged by in large. Using Iodoflex 12/13/16; her wounds today actually looks somewhat better. The area on the right lateral lower leg has reasonably healthy-looking granulation and perhaps as actually filled and a bit. Debridement of the 2 wounds on the medial calf is easier and post debridement appears to have a healthier base. We have referred her to Dr. Migdalia Dk for consideration of operative debridement 12/20/16; we have a quick note from Dr. Merri Ray who feels that the patient needs to be referred to an academic center/plastic surgeon. This is due to the complexity of the patient's medical issues as it applies to anything in the OR. We  have been using Iodoflex 12/27/16; in general the wounds on the right leg are better in terms of the difficult to remove surface slough. She has been using Iodoflex. She is approved for Apligraf which I anticipated ordering in the next week or 2 when we get a better-looking surface 01/04/16 the deep wounds on the right leg generally look better. Both of them are debrided further surface slough. The area on the lateral right leg was not debrided. She is approved for Apligraf I think I'll probably order this either next week or the week after depending on the surface of the wounds superiorly. We have been using Iodoflex which will continue until then 01/11/16; the deep wounds on the right leg again have a surface slough that requires debridement. I've not been able to get the wound bed on either one of these wounds down to what would be acceptable for an advanced skin stab-like Apligraf. The area on the medial leg has been improving. We have been using hot Iodoflex to all wounds which seems to do the best at at least limiting the nonviable surface 01/24/17; we have continued Iodoflex and all her wound areas. Her debridement Gen. he is easier and the surface underneath this looks viable. Nevertheless these are large area wounds with exposed muscle at least on the anterior parts. We have ordered Apligraf's for 2 weeks from now. The patient will be away next week 02/07/17; the patient was close to have first Apligraf today however we did not order one. I therefore replaced her Iodoflex. She essentially has 3 large punched- out areas on her right anterior leg and right medial ankle. 02/11/17; Apligraf #1 02/25/17; Apligraf #2. In general some improvement in the right medial ankle and right anterior leg wounds. The larger wound medially perhaps some better 03/11/17; Apligraf #3. In general continued improvement in the right medial ankle and the right anterior leg wounds. 03/25/17 Apligraf #4. In general continued  improvement especially on the right medial ankle and the lower 04/08/17; Apligraf #5 in general continued improvement in all wound sites. 04/22/17; post Apligraf #5 her wound beds continued to look a lot better all of them up to the surface of the surrounding skin. Had a caramel-colored slough that I did not debrided in case there is residual Apligraf effect. The wounds are as good as I have seen these looking quite some time. 04/29/17;  we applied [place] and last week after completing her Apligraf. Wounds look as though they've contracted somewhat although they have a nonviable surface which was problematic in the past. Apparently she has been approved for further Apligraf's. We applied Iodosorb today after debridement. 05/06/17; we're fortunate enough today to be able to apply additional Apligraf approved by her insurance. In general all of her wounds look better Apligraf #6 05/24/17; Apligraf #7 continued improvement in all wounds 3 06/10/17 Apligraf #8. Continued improvement in the surface of all wounds. Not much of an improvement in dimensions as I might a follow 06/24/17 Apligraf #9 continued general improvement although not as much change in the wound areas I might of like. She has a new open area on the right anterior lateral ankle very small and superficial. She also has a necrotic wound on the tip of her right index finger probably secondary to severe uncontrolled Raynaud's phenomenon. She is already on sildenafil and already seen her rheumatologist who gave her Keflex. 07/08/17; Apligraf #10. Generalized improvement although she has a small additional wound just medial to the major wound area. 07/22/17; after discussion we decided not to reorder any further Apligraf's although there is been considerable improvement with these it hasn't been recently. The major wound anteriorly looks better. Smaller wounds beneath this and the more recent one and laterally look about the same. The area on the  right lateral lower leg looks about the same 07/29/17 this is a patient who is exceedingly complex. She has advanced scleroderma, crest syndrome including calcinosis, PAD status post revascularization, chronic venous insufficiency status post ablations. She initially presented to this clinic with wounds on her bilateral lower legs however these closed. More recently we have been dealing with a large open area superiorly on the right anterior leg, a smaller wound underneath this and then another one on the right medial lower extremity. These improve significantly with 10 Apligraf applications. Over the last 2-3 weeks we are making good progress with Hydrofera Blue and these seem to be making progress towards closure 08/19/17; wounds continued to make good improvement with Hydrofera Blue and episodic aggressive debridements 08/26/17; still using Hydrofera Blue. Good improvements 09/09/17; using Hydrofera Blue continued improvement. Area on the lateral part of her right leg has only a small remaining open area. The small inferior satellite region is for all intents and purposes closed. Her major wound also is come in in terms of depth and has advancing epithelialization. 09/16/17; using Hydrofera Blue with continued improvement. The smaller satellite wound. We've closed out today along with a new were satellite wound medially. The area on her medial ankle is still open and her major wound is still open but making improvement. All using Hydrofera Blue. Currently 09/30/17; using Hydrofera Blue. Still a small open area on the lateral right ankle area and her original major wound seems to be making gradual and steady improvement. 10/14/17; still using Hydrofera Blue. Still too small open areas on the right lateral ankle. Her original major wound is horizontal and linear. The most problematic area paradoxically seems to be the area to the medial area wears I thought it would be the lateral. 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 10/28/17; still using Hydrofera Blue. Right lateral ankle has a very small open area superiorly on the most lateral part of the wound. Her original open wound has 2 open areas now separated by normal skin and we've redefined this. 11/11/17; still  using Hydrofera Blue area and right lateral ankle continues to have a small open area on mostly the lateral part of the wound. Her original wound has 2 small open areas now separated by a considerable amount of normal skin 11/28/17; the patient called in slightly before Thanksgiving to report pain and erythema above the wound on the right leg. In the past this is responded well to treatment for cellulitis and I gave her over the phone doxycycline. She stated this resulted in fairly abrupt improvement. We have been using Hydrofera Blue for a prolonged period of time to the larger wound anteriorly into the remaining wound on her right right lateral ankle. The latter is just about closed with only a small linear area and the bottom of the [place]. 12/02/17; use endoform and left the dressing on since last visit. There is no tenderness and no evidence of infection. 12/16/17; patient has been using Endoform but not making much progress. The 2 punched out open areas anteriorly which were the reminiscence of her major wound appear deeper. The area on the lateral aspect of her right calf also appears deeper. Also she has a puzzling tender swelling above her wound on the right leg. This seems larger than last time. Just above her wounds there appears to be some fluctuance in this area it is not erythematous and there is no crepitus 12/30/17; patient has been using Endoform up until last week we used Hydrofera Blue. Ultrasound of the swelling above her 2 major wounds last time was negative for a fluid collection. I gave her cefaclor for the erythema and tenderness in this area which seems better.  Unfortunately both punched out areas anteriorly and the area on her right medial lower leg appear deeper. In fact the lateral of the wounds anteriorly actually looks as though it has exposed tendon and/or muscle sheath. She is not systemically unwell. She is complaining of vaginitis type symptoms presumably Candida from her antibiotics. 01/06/18; we're using santyl. she has 2 punched out areas anteriorly which were initially part of a large wound. Unfortunately medially this is now open to tendon/muscle. All the wounds have the same adherent very difficult to debride surface. 01/20/18; 2 week follow-up using Santyl. She has the 2 punched out areas anteriorly which were initially part of her large surface wound there. Medially this still has exposed muscle. All of these have the same tightly adherent necrotic surface which requires debridement. PuraPly was not accepted by the patient's insurance however her insurance I think it changed therefore we are going to run Apligraf to gain 02/03/18; the patient has been using Santyl. The wound on the right lateral ankle looks improved and the 2 areas anteriorly on the right leg looks about the same. The medial one has exposed muscle. The lateral 1 requiresdebridement. We use PuraPly today for the first week 02/10/18; PuraPly #2. The patient has 3 wounds. The area on the right lateral ankle, 2 areas anteriorly that were part of her original large wound in this area the medial one has exposed muscle. All of the wounds were lightly debrided with a number 3 curet. PuraPly #2 applied the lateral wound on the calf and the right lateral ankle look better. 02/17/18; PuraPly #3. Patient has 3 wounds. The area on the right lateral ankle in 2 areas internally that were part of her original large wound. The lateral area has exposed muscle. She arrived with some complaints of pain around the right ankle. 02/24/18; PuraPly #4; not much change  in any of the 3 wound areas. Right  lateral ankle, right lateral calf. Both of these required debridement with a #3 curet. She tolerates this marginally. The area on the medial leg still has exposed muscle. Not much change in dimensions 03/03/18 PuraPly #5. The area on the medial ankle actually looks better however the 2 separate areas that were original parts of the larger right anterior leg wound look as though they're attempting to coalesce. 03/10/18; PuraPly #6. The area on the medial ankle actually continues to look and measures smaller however the 2 separate areas that were part of the original large wound on the right anterior leg have now coalesced. There hasn't been much improvement here. The lateral area actually has underlying exposed muscle 03/17/18-she is here in follow-up evaluation for ulcerations to the right lower extremity. She is voicing no complaints or concerns. She tolerated debridement. Puraply#7 placed 03/24/18; difficult right lower extremity ulcerations. PuraPly #8 place. She is been approved for [employer]. She did very well with Apligraf today however she is apparently reached her "lifetime max" 03/31/18; marginal improvement with PuraPly although her wounds looked as good as they have in several weeks today. Used TheraSkins #1 04/14/18 TheraSkin #2 today 04/28/18 TheraSkins #3. Wound slightly improved 05/12/18; TheraSkin skin #4. Wound response has been variable. 05/27/18 TheraSkin #5. Generally improvement in all wound areas. I've also put her in 3 layer compression to help with the severe venous hypertension 06/09/18; patient is done quite well with the TheraSkins unfortunately we have no further applications. I also put her in 3 layer compression last week and that really seems to of helped. 06/16/18; we have been using silver collagen. Wounds are smaller. Still be open area to the muscle layer of her calf however even that is contracted somewhat. She tells me that at night sometimes she has pain on the right  lateral calf at the site of her lower wound. Notable that I put her into 3 layer compression about 3 weeks ago. She states that she dangles her leg over the bed that makes it feel better but she does not describe claudication during the day She is going to call her secondary insurance to see if they will continue to cover advanced treatment products I have reviewed her arterial studies from 01/22/17; this showed an ABI in the right of 1 and on the left noncompressible. TBI on the right at 0.30 on the left at 0.34. It is therefore possible she has significant PAD with medial calcification falsely elevating her ABI into the normal range. I'll need to be careful about asking her about this next week it's possible the 3 layer compression is too much 06/23/18; was able to reapply TheraSkin 1 today. Edema control is good and she is not complaining of pain no claudication 07/07/18;no major change. New wound which was apparently a taper removal injury today in our clinic between her 2 wounds on the right calf TheraSkins #2 07/14/18; I think there is some improvement in the right lateral ankle and the medial part of her wound. There is still exposed muscle medially. 07/28/18; two-week follow-up. TheraSkins#3. Unfortunately no major change. 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 She seems to be doing well and in the base of the wound appears to show some progress at this point. This is the  last approved Theraskin of the second set. 08/25/18; she has completed TheraSkin. There has been some improvement on the right lateral calf wound as well as the anterior leg wounds. The open area to muscle medially on the anterior leg wound is smaller. I'm going to transition her back to [facility] under Kerlix Coban change every  second day. She reports that she had some calcification removed from her right upper arm. We have had previous problems with calcifications in her wounds on her legs but that has not happened recently 09/08/18;using Hydrofera Blue on both her wound areas. Wounds seem to of contracted nicely. She uses Kerlix Coban wrap and changes at home herself 09/22/2018; using Hydrofera Blue on both her wound areas. Dimensions seem to have come down somewhat. There is certainly less depth in the medial part of the mid tibia wound and I do not think there is any exposed muscle at this point. 10/06/2018; 2-week follow-up. Using [facility]: [facility] W on her wound areas. Dimensions have come down nicely both on the right lateral ankle area in the right mid tibial area. She has no new complaints 10/20/2018; 2-week follow-up. She is using Hydrofera Blue. Not much change from the last time she was here. The area on the lateral ankle has less depth although it has raised edges on one side. I attempted to remove as much of the raised edge as I could without creating more additional wounds. The area on the right anterior mid tibia area looks the same. 11/03/2018; 2-week follow-up she is using Hydrofera Blue. On the right anterior leg she now has 2 wounds separated by a large area of normal skin. The area on the medial part still has I think exposed muscle although this area itself is a lot smaller. The area laterally has some depth. Both areas with necrotic debris. The area on her right lateral ankle has come in nicely 11/17/2018; patient continues to use Hydrofera Blue. We have been increasing separation of the 2 wounds anteriorly which were at one point joint the area on the right lateral calf continues to have I think some improvement in depth. 12/08/2018; patient continues to use Hydrofera Blue. There is some improvement in the area on the right lateral calf. The 2 areas that were initially part of the original large wound in the  mid right tibia are probably about the same. In fact the medial area is probably somewhat larger. We will run puraply through the patient's insurance 12/22/2018; she has been using Hydrofera Blue. We have small wounds on the right lateral calf and 2 small areas that were initially part of a large wound in the right mid tibia. We applied pure apply #1 today. 12/29/2018; we applied puraply #2. Her wounds look somewhat better especially on the right lateral calf and the lateral part of her original wound in the mid tibial area. 01/05/2019; perhaps slightly improved in terms of wound bed condition but certainly not as much improvement as I might of liked. Puraply #3 1/13: we did not have a correct sized puraply to apply. wounds more pinched out looking, I increased her compression to 3 layer last week to help with significant multilevel venous hypertension. Since then I've reviewed her arterial status. She has a right femoral endarterectomy and a distal left SFA stent. She was being followed by Dr. [name] for a period however she does not appear to have seen him in 3 years. I will set up an appointment. 1/20. The patient has an additional wound  on the right lateral calf between the distal wound and proximal wounds. We did not have Puraply last week. Still does not have a follow-up with Dr. early 1/27: Follow-up with Dr. early has been arranged apparently with follow-up noninvasive studies. Wounds are measuring roughly the same although they certainly look smaller 2/3; the patient had non invasive studies. Her ABIs on the right were 0.83 and on the left 1.02 however there was no great toe pressure bilaterally. Also worrisome monophasic waveforms at the PTA and dorsalis pedis. We are still using Puraply. We have had some improvement in all of the wounds especially the lateral part of the mid tibial area. 2/10; sees Dr. early of vein and vascular re-arterial studies next week. Puraply reapplied today. 2/17;  Dr. early of vein and vascular his appointment is tomorrow. Puraply reapplied after debridement of all wounds 2/24; the patient saw Dr. early I reviewed his note. sHe noted the previous right femoral endarterectomy with a Dacron patch. He also noted the normal ABI and the monophasic waveforms suggesting tibial disease. Overall he did not feel that she had any evidence of arterial insufficiency that would impair her wound healing. He did note her venous disease as well. He suggested PRN follow-up. 3/2; I had the last puraply applied today. The original wounds over the mid tibia area are improved where is the area on the right lower leg is not 3/9; wounds are smaller especially in the right mid tibia perhaps slightly in the right lateral calf. We finished with puraply and went to endoform today 3/23; the patient arrives after 2 weeks. She has been using endoform. I think all of her wounds look slightly better which includes the area on the right lateral calf just above the right lateral malleolus and the 2 in the right mid tibia which were initially part of the same wound. 4/27; TELEHEALTH visit; the patient was seen for telehealth visit today with her consent in the middle of the worldwide epidemic. Since she was last here she called in for antibiotics with pain and tenderness around the area on the right medial ankle. I gave her empiric doxycycline. She states this feels better. She is using endoform on both of these areas 5/11 TELEHEALTH; the patient was seen for telehealth visit today. She was accompanied at home by her husband. She has severe pulmonary hypertension accompanied scleroderma and in the face of the Covid epidemic cannot be safely brought into our clinic. We have been using endoform on her wound areas. There are essentially 3 wound areas now in the left mid tibia now 2 open areas that it one point were connected and one on the right lateral ankle just above the malleolus. 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 She cannot come to clinic. The wounds were reviewed today via telehealth. She has severe chronic venous hypertension which I think is centrally mediated. She has wounds on her right anterior tibia and right lateral ankle area. These are chronic. She has been using endoform. 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 We have been following her from telehealth. She has severe chronic venous hypertension which may be mostly centrally mediated secondary to right heart heart failure. She has  wounds on her right anterior tibia and right lateral ankle these are chronic we have been using endoform 6/22; TELEHEALTH; this patient was seen today via telehealth. 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 Since the last time we had contact with this patient she developed some pain and erythema around the wound on her right lateral malleolus/ankle and we put in antibiotics for her. This is resulted in good improvement with resolution of the erythema and tenderness. I changed her to silver alginate last time. We had been using endoform for an extended period of time 7/13; TELEHEALTH; this patient was seen today via telehealth. 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 She would continue to be at a prohibitive risk to be brought into our clinic unless this was absolutely necessary. These visits have been done with her approval as well as her husband. We have been using silver alginate to the areas on the right mid tibia and right lateral lower leg. 7/27  TELEHEALTH; patient was seen along with her husband today via telehealth. 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 We changed her to sample last visit. She has 2 areas a chronic wound on her right mid tibia and one just above her ankle. These were not the original wounds when she came into this clinic but she developed them during treatment 8/17; she comes in for her first face-to-face visit in a long period. She has a remaining area just medial to the right tibia which is the last open part of her large wound across this area. She also has an area on the right lateral lower leg. We prescribed Santyl last telehealth visit but they were concerned that this was making a deeper so they put silver alginate on it last week. Her husband changes the dressings. 8/31; using Santyl to the 2 wound areas some improvement in wound surfaces. Husband has surgery in 2 weeks we will put her out 3 weeks. Any of the advanced treatment options that I can think of that would be eligible for this wound would also cause her to have to come in weekly. The risk that the patient is just too high 9/21. Using Santyl to the 2 wound areas. Both of these are somewhat better although the medial mid tibia area still has exposed muscle. Lateral ankle requiring debridement. Using Santyl 10/12; using Santyl to the 2 wound areas. One on the right lateral ankle and the other in the medial calf which still has exposed muscle. Both areas have come down in size and have better looking surfaces. She has made nice progress with santyl 11/2; we have been using Santyl to the 2 wound areas. Right lateral ankle and the other in the mid tibia area the medial part of this still has exposed muscle. 11/23/2019 on evaluation today patient appears to be doing about the same really with regard to her wounds. She is actually not very pleased with how things seem to  be progressing at this point she tells me that she really has not noted much improvement unfortunately. With that being said there is no signs of active infection at this time. There is some slough buildup noted at this time which again along with some dry skin around the edges of the wound I think would benefit her to try to  debride some of this away. Fortunately her pain is doing fairly well. She still has exposed muscle in the right medial/tibial area. 12/14; TELEHEALTH; she was changed to [facility] to the right calf wound and right lateral ankle wound when she was here last time. Unfortunately since then she had a fall with a pelvic fracture and a fracture of her wrist. She was apparently hospitalized for 5 days. I have not looked at her discharge summary. She apparently came out of the hospital with a blister on her right heel. She was seen today via telehealth by myself and our case manager. The patient and her husband were present. She has been using Hydrofera Blue at our direction from the last time she was in the clinic. There is been no major improvement in fact the areas appear deeper and with a less viable surface 01/04/2020; TELEHEALTH; the patient was seen today in accompaniment of her husband and our nurse. She has 3 open areas 1 on the right medial mid tibia, one on the right lateral ankle and a large eschared pressure area on the right heel. We have been using Iodoflex to the 2 original wounds. 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 It is simply too perilous for her to be seen in any other way 1/26; TELEHEALTH; the patient was seen today in accompaniment of her husband and our RN one of our nurses. She still has the 3 open areas 1 on the right medial mid tibia which is the remanent of a more extensive wound in this area, one on the right lateral ankle and a large eschared pressure area on the right heel. We have been using Iodoflex to the 2  original wounds and a bed at 9 application to the eschared area on the heel 2/15; the first time we have seen this patient and then several months out of concern for the pandemic. She had a large horizontal wound in the mid tibia. Only the medial aspect of this is still open. Area on the lateral ankle is just about closed. She had a new pressure ulcer on the right heel which I have removed some of the eschar. We have been using Iodoflex which I will continue. The area in the mid tibia has a round circle in the middle of exposed muscle. I think we would have to use an advanced treatment product to stimulate granulation over this area. We will run this through her insurance. She is not eligible for plastic surgery for many reasons 3/1; TELEHEALTH. The patient was seen today by telehealth. 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 We have been using Iodoflex to the wound areas which include the right anterior mid tibia, right lateral ankle and the right heel. All of these were reviewed 3/8; the patient's wound just above the right ankle is closed. She still has a contracting black eschar on the heel where she had a pressure ulcer. The medial part of her original wound on the mid tibia has exposed muscle. We have made really made no progress in this area although we have managed to get a lot of the original wound in this area to close. We used Apligraf #1 today 3/22; the patient's ankle wound remains closed. She still has a contracting black eschar on the heel although it seems to have less surface area. It still not clear whether there is any depth here. We  used Apligraf #2 today in an attempt to get granulation over the exposed muscle and what is left of her mid tibia area. 4/5; everything is closed except the medial aspect of the mid tibia wound, as well as the pressure injury on the right heel. She has been  using Betadine to the right heel which has been gradually contracting. We used Apligraf #3 today. 4/19; Apligraf #4. Still a pressure area on the right heel she has been using Betadine superficial excoriated areas she has been applying salicylic acid to on the anterior leg and the thick horn on the leg just above her wound area 5/3; Apligraf #5. Unfortunately she came in today with a reopening of the area on the right lateral lower leg. Not much change in the wound we have been treating in the mid calf. Quite a bit of edema surrounding this wound. She reports that she was up on her feet quite a bit. The area on the right heel is separating she has been using Betadine She comes in with a new wound on the left lateral malleolus which is very disappointing this is been open since last week 5/17; we applied her last Apligraf last time. Unfortunately that did not have any effect on the deep area medially in the mid tibial area. However the area over the right lateral malleolus is a lot better. Right heel also is contracted we used Iodoflex last time. The new wound on the left lateral malleolus were using Santyl to. This required debridement. 6/1. Not much change in the area on the right medial mid tibia. Right lateral ankle is improved she has the necrotic area on the right heel which was a pressure area. New area on the left lateral malleolus from last time. I changed her to Iodoflex to the area on the left lateral malleolus she said this hurt I have put her back on Santyl 6/15; right mid tibia is unchanged. I do not think there is anything I can do to this topically that we will get this to granulate over the muscle. And I am furthermore I am not sure that she is a surgical candidate either because of her severe pulmonary issues or because she really does not want to go through it. The area on the right lateral malleolus is a small punched out area. The area on the left lateral malleolus again small  punched out with nonviable surface Pressure ulcer on the right heel at separating black eschar. I went ahead and remove this today she has an area on the left anterior mid tibia just laterally. Geographic wounds debris on the surface. Some erythema here. 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 There is skin changes widely Change the primary dressing to Sorbact to all wound areas. She needs compression on the left leg as well as the right 6/21; right mid tibia which was her one remaining wound at 1 point is unchanged The area on the right lateral malleolus actually is close to closing over still a small punched out area The area on the left lateral malleolus again a deep wound it is this 1 that she feels pain in. Pressure ulcer on the right heel was cultured today. New area on the left anterior mid tibia several geographic wounds last week in the setting of chronic venous insufficiency this actually looks some better 7/6; Right mid tibia about the same. Exposed muscle Left lateral malleolus again necrotic debris over the surface. Pressure ulcer on the right  heel. She finished the Keflex we prescribed. Necrotic debris debrided with a #3 curette The rest of her wounds on the right mid tibia right lateral ankle are better Her husband reminds me that she had a right femoral endarterectomy and has 2 stents in her left thigh placed in 2011 approximately by vascular surgery in [place]. She saw Dr. [name] last in February 2020. At that point he did not think that the arterial insufficiency she had on the right was sufficient to explain any of her right lower extremity wounds however she now has an area on the left lateral malleolus that looks like an ischemic wound 7/12; we have been using Sorbact all wound areas Right mid tibia about the same exposed muscle Left lateral malleolus small wound with debris in the surface punched out Pressure ulcer of the right heel requiring  debridement of necrotic debris Lateral ankle wound on the right is just about closed Right mid tibia wound about the same still with exposed muscle. 7/26 really no change in any wounds. We have been using Sorbact. She has bilateral lower extremity wounds. This is in the setting of scleroderma, severe venous hypertension. She also has known PAD and I had really hoped to be able to get a review by Dr. [name] but apparently that is not booked until August 31. I have asked her to call back to vein and vascular and get an appointment with somebody a little earlier if that is possible. 8/16; patient's area on the right ankle which was a chronic wound is closed over again. The area on the right mid lower leg, right heel left lateral ankle are all about the same. Pressure ulcer on the right heel is still not viable. New areas identified on the right lateral calf. She has a follow-up with Dr. [name] on 8/31. Phenomenally I would like him to go over her arterial status with regards to her underlying stents. Electronic Signature(s) Signed: 08/15/2020 5:53:04 PM By: Linton Ham MD Signed: 08/15/2020 5:53:04 PM By: Linton Ham MD Entered By: Linton Ham on 08/15/2020 17:40:34 -------------------------------------------------------------------------------- Physical Exam Details Patient Name: Date of Service: [name], [name] 08/15/2020 11:00 A M Medical Record Number: [id number] Patient Account Number: [id number] Date of Birth/Sex: Treating RN: [birth year] (69 y.o. F) Primary Care Provider: [name], [place] RY Other Clinician: Referring Provider: Treating Provider/Extender: [name], [place] RY Weeks in Treatment: [phone] Cardiovascular Pedal pulses are palpable but difficult to feel. Edema in the lower extremities is well controlled. Integumentary (Hair, Skin) Thick skin in her lower extremities compatible with scleroderma. Notes Wound exam Midportion of the right tibia still has exposed  muscle right lateral ankle wound I think is closed over. She has the pressure ulcer on the tip of her right heel which is still necrotic. She has some degree of skin breakdown on the right lateral calf although I do not know that this is technically open Left lateral malleolus again and nonviable base Electronic Signature(s) Signed: 08/15/2020 5:53:04 PM By: Linton Ham MD Entered By: Linton Ham on 08/15/2020 17:43:21 -------------------------------------------------------------------------------- Physician Orders Details Patient Name: Date of Service: [name], [name] 08/15/2020 11:00 A M Medical Record Number: [id number] Patient Account Number: [id number] Date of Birth/Sex: Treating RN: 1951-09-10 (69 y.o. 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 Dressing Change Frequency Change Dressing every other day. - all wounds Skin Barriers/Peri-Wound Care Moisturizing lotion - both legs Wound Cleansing May shower with protection. Primary Wound Dressing Wound #16 Right Calcaneus Cutimed Sorbact Wound #19 Right,Lateral  Malleolus Cutimed Sorbact Wound #23 Right,Posterior Lower Leg Cutimed Sorbact Wound #20 Left,Lateral Malleolus Santyl Ointment Wound #5 Right,Medial Lower Leg Cutimed Sorbact Secondary Dressing Wound #16 Right Calcaneus Dry Gauze Wound #19 Right,Lateral Malleolus Dry Gauze Wound #20 Left,Lateral Malleolus Dry Gauze Wound #5 Right,Medial Lower Leg Dry Gauze Edema Control Kerlix and Coban - Bilateral Avoid standing for long periods of time Elevate legs to the level of the heart or above for 30 minutes daily and/or when sitting, a frequency of: - throughout the day Off-Loading Turn and reposition every 2 hours Other: - float heels off of bed/chair with pillow under calves Electronic Signature(s) Signed: 08/15/2020 5:14:32 PM By: Levan Hurst RN, BSN Signed: 08/15/2020 5:53:04 PM By: Linton Ham MD Entered By: Levan Hurst on 08/15/2020 12:44:13 -------------------------------------------------------------------------------- Problem List Details Patient Name: Date of Service: [name], [name] 08/15/2020 11:00 A M Medical Record Number: [id number] Patient Account Number: [id number] Date of Birth/Sex: Treating RN: 08/19/1951 (69 y.o. 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828 Non-pressure chronic ulcer of other part of left lower leg with other specified severity 06/14/2020 06/14/2020 Resolved Problems Electronic Signature(s) Signed: 08/15/2020 5:53:04 PM By: Linton Ham MD Previous Signature: 08/15/2020 5:14:32 PM Version By: Levan Hurst RN, BSN Entered By: Linton Ham on 08/15/2020 17:39:02 -------------------------------------------------------------------------------- Progress Note Details Patient Name: Date of Service: [name], [name] 08/15/2020 11:00 A M Medical Record Number: [id number] Patient Account Number: [id number] Date of Birth/Sex: Treating RN: 10-16-1951 (69 y.o. F) Primary Care Provider: [name], [place] RY Other Clinician: Referring Provider: Treating Provider/Extender: [name], MA RY Weeks in Treatment: [phone] Subjective History of Present Illness (HPI) this is a patient who initially came to [place] for wounds on the medial malleoli bilaterally as well as her upper  medial lower extremities bilaterally. These wounds eventually healed with assistance of Apligraf's bilaterally. While this was occurring she developed the current wound which opened into a fairly substantial wound on the right lower extremity. These are mostly secondary to venous stasis physiology however the patient also has underlying scleroderma, pulmonary hypertension. The wound has been making good progress lately with the Hydrofera Blue-based dressing. 03/17/2015; patient had a arterial evaluation a year ago. Her right ABI was 0.86 left was 1.0. T brachial index was  0.41 on the right 0.45 on the left. Her oe bilateral common femoral artery waveforms were triphasic. Her white popliteal posterior tibial artery and anterior tibial artery waveforms were monophasic with good amplitude. Luteal artery waveforms were biphasic it was felt that her bilateral great toe pressures are of normal although adequate for tissue healing. 03/24/2015. The condition of this wound is not really improved that. He was covered with as fibrinous surface slough and eschar. This underwent an aggressive debridement with both a curette and scalpel. I still cannot really get down to what I can would consider to be a viable surface. There is no evidence of infection. Previous workup for ischemia roughly a year ago was negative nevertheless I think that continues to be a concern 04/07/15. The patient arrived for application of second Apligraf. Once again the surface of this wound is certainly less viable than I would like for an advanced treatment option. An aggressive debridement was done. She developed some arteriolar bleeding which required that along pressure and silver alginate. 04/21/15: change in the condition of this wound. Once again it is covered in a gelatinous surface slough. After debridement today surface of the wound looks somewhat better but now a heeling surface 05/05/15 Apligraf #4 applied.Still a lot of slough on this wound. 05/19/15 Apligraf #5 applied. Still a lot of slough on this wound I did not aggressively remove this 06/02/15; continued copious amounts of surface slough. This debridement fairly easily. 06/08/2015 -- the last time she had a venous duplex study done was over 3 years ago and the surgery was prior to that. I have recommended that she sees Dr. early for a another opinion regarding a repeat venous duplex and possibly more endovenous ablation of vein stripping of micro-phlebotomies. 06/16/15; wound has a gelatinous surface eschar that the debridement fairly easily to a  point. I don't disagree with the venous workup and perhaps even arterial re-evaluation. She is on prednisone 5 mg and continue his medications for her pulmonary artery hypertension I am not sure if the latter have any wound care healing issues I would need to investigate this. 06/23/15 continues with a gelatinous surface eschar with of fibrinous underlying. What I can see of this wound does not look unhealthy however I just can't get this material which I think is 2 different layers off. Empiric culture done 06/30/15; unfortunately not a lot of change in this wound. A gelatinous surface eschar is easily removed however it has a tight fibrinous surface underneath the. 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 The wound comes back and basically and unchanged state the. She has a gelatinous surface that is more easily removed however there is a tightly fibrinous surface underneath the. There is no evidence of infection. She has a vascular follow-up next month. I would have to inject her in order to do a more aggressive debridement of this area 07/21/15 the wound is roughly in the same state albeit the debridement is done with  greater ease. There is less of the fibrinous eschar underneath the. There is no evidence of infection. She has follow-up with vascular surgery next week. No evidence of surrounding infection. Her original distal wounds healed while this one formed. 07/28/15; wound is easier to debride. No wound erythema. She is seeing Dr. [name] next week. 08/18/15 Has been to [place] for repeat ablation. Have been using medihoney pad with some improvement 08/25/15; absolutely no change in the condition of this wound in either its overall size or surface condition in many months now. At one point I had this down to [place] healthier surface I think with [facility] however this did not actually progress towards closure. Do not believe that the wound has actually deteriorated in  terms of volume at all. We have been using a medihoney pad which allows easier debridement of the gelatinous eschar but again no overall actual improvement. the patient is going towards an ablation with pain and vascular which I think is scheduled for next week. The only other investigation that I could first see would be a biopsy. She does have underlying scleroderma 09/02/15 eschar is much easier to debridement however the base of this does not look particularly vibrant. We changed to Iodoflex. The patient had her ablation earlier this week 09/09/15 again the debridement over the base of this wound is easier and the base of the wound looks considerably better. We will continue the Iodoflex. Dr. Tawni Millers has expressed his satisfaction with the result of her ablation 09/15/15 once again the wound is relatively free of surface eschar. No debridement was done today. It has a pale-looking base to it. although this is not as deep as it once was it seems to be expanding especially inferiorly. She has had recent venous ablation but this is no closer to healing.I've gone ahead and done a punch biopsy this from the inferior part of the wound close to normal skin 09/22/15: the wound is relatively free of surface eschar. There is some surrounding eschar. I'm not exactly sure at what level the surface is that I am seeing. 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 No evidence of atypical infection, malignancy. 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 No debridement was done 09/29/15; the wound surface is relatively free of surface eschar. There is eschar surrounding the walls of the wound. Aside from the improvement in the amount of surface slough. This wound has not progressed any towards closure. There is not even a surface that looks like there at this is ready. There is no evidence of any infection nor maligancy based on biopsy I did on 9/15. I continued with  the Iodoflex however I am looking towards some alternative to try to promote some closure or filling in of this surface. Consider triple layer Oasis. Collagen did not result in adequate control of the surface slough 10/13/15; the patient was in hospital last week with severe anemia. The wound looks somewhat better after debridement although there is widening medially. There is no evidence of infection. 10/20/15; patient's wound on the right lateral lower leg is essentially unchanged. This underwent a light surface debridement and in general the debridement is easier and the surface looks improved. I noted in doing this on the side of the wound what appears to be a piece of calcium deposition. The patient noted that she had previous things on the tips of her fingers. 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 She follows with rheumatology and I have asked them to talk to her about this. In view of that S the nonhealing ulcer may have something to do with calcinosis and also unrelieved Raynaud's disease in this area. I should note that her original wounds on the right and left medial malleolus and the inner aspects of both legs just below the knees did however heal over 10/30/15; the patient's wound on the right lateral lower leg is essentially unchanged. I was able to remove some calcified material from the medial wound edge. 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 Otherwise the wound appears essentially unchanged there is less adherent surface eschar. Some of the calcified material was sent to pathology for analysis 11/17/15. The patient's wound on the right lateral leg is essentially unchanged. Wider Medially. 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 There is some surface slough but I did not debridement this. No evidence of surrounding infection. 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 This will make healing difficult 12/15/15; the wound has less depth surface has a fibrinous slough and calcifications in the wound edge no evidence of infection 12/22/15; the wound definitely has less Fibrinous slough on the base. Calcifications around the wound edges are still evident. Although the wound bed looks healthier it is still pale in appearance. Previous biopsy did not show malignancy 01/04/15; surgical debridement of nonviable slough and subcutaneous tissue the wound cleans up quite nicely but appears to be expanding outward calcifications around the wound edges are still there. 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 She is to see her rheumatologist I'll see if he has any opinions on this. My punch biopsy done in September did not show calcifications although these are clearly evident. 01/19/16 light selective debridement of nonviable surface slough. There is epithelialization medially. This gives me reason for cautious optimism. She has been to see her rheumatologist, there is nothing that can be done for this type of soft tissue calcification associated with scleroderma 02/02/16 no debridement although there is a light surface slough. She has 2 peninsulas of skin 1 inferiorly and one medially. We continue to make a slight and slow but definite progressive here 02/16/16; light surface debridement with more attention to the circumference of the wound bed where the fibrinous eschar is more prevalent. No calcifications detected. She seems to have done nicely with the [facility] with some epithelialization and some improvement in the overall wound  volume. 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 Continued attention to the circumference of the wound where the fibrinous eschar in calcinosis or prevalent. No calcifications were detected. She has continued improvement with Hydrofera Blue. The wound is no longer as deep 03/15/16 surgical debridement done to remove surface escha Especially around the circumference of the wound where there is nonviable subcutaneous tissue. In spite of this there is considerable improvement in the overall dimensions and depth of the wound. Islands of epithelialization are seen especially medially inferiorly and superiorly to a lesser extent. She is using Hydrofera Blue at home 03/29/16;  surgical debridement done to remove surface eschar and nonviable subcutaneous tissue. This cleans up quite nicely mention slightly larger in terms of length and width however depth is less 04/12/16; continued gradual improvement in terms of depth and the condition of the wound base. Debridement is done. Continuing long standing Hydrofera Blue at home with Kerlix Coban wraps 04/26/16; continued gradual improvement in terms of depth and management as well as condition of the wound base. Surgical debridement done she continues with Hydrofera Blue. 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 She initially came to this clinic venous insufficiency ulcers which have long since healed 05/17/16 continued improvement in terms of the depth and measurements of this wound although she has a tightly adherent fibrinous slough each time. We've been continued with long standing Hydrofera Blue which seems to done as well for this wound is many advanced treatment options. 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 She also has chronic venous  insufficiency. She has an irritation on her lateral right ankle secondary to our wraps 05/10/16; wound appears to be smaller especially on the medial aspect and especially in the width. Wound was debridement surface looks better. She is also been in the hospital apparently with anemia again she tells me she had an endoscopy. Since she got home after 3 days which I believe was sometime last week she has had an irritated painful area on the right lateral ankle surrounding the lateral malleolus 05/31/16; much more adherent surface slough today then recently although I don't think the dimensions of changed that much. A more aggressive debridement is required. The irritated area over her medial malleolus is more pruritic and painful and I don't think represents cellulitis 06/14/16; no major change in her wound dimensions however there is more tightly adherent surface slough which is disappointing. As well as she appears to have a new small area medially. Furthermore an irritated uncomfortable area on the lateral aspect of her right foot just below the lateral malleolus. 06/21/16; I'm seeing the patient back in follow-up for the new areas under her major wound on the right anterior leg. She has been using Hydrofera Blue to this area probably for several months now and although the dressings seem to be helping for quite a period of time I think things have stagnated lately. She comes in today with a relatively tight adherent surface slough and really no changes in the wound shape or dimension. The 2 small areas she had inferiorly are tiny but still open they seem improved this well. There is no uncontrolled edema and I don't think there is any evidence of cellulitis. 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 Patient has had vascular evaluations both venous and arterial. I have biopsied this area. There is  no obvious infection. The worrisome thing today is that she seems to be developing areas of erythema and epithelial damage on the medial aspect of the right foot. Also to a lesser degree inferior to the actual wound itself. Again I see no obvious changes to suggest cellulitis however as this is the only treatable option I will probably give her antibiotics. 07/13/16 no major change in the lady's large anterior right leg wound. Still covered with a very tightly adherent surface slough which is difficult to debridement. There is less erythema around this, culture last week grew pseudomonas  I gave her ciprofloxacin. The area on her lateral right malleolus looks better- 08/02/16 the patient's wounds continued to decline. Her original large anterior right leg wound looks deeper. Still adherent surface slough that is difficult to debridement. She has a small area just below this and a punched-out wound just below her lateral malleolus. In the meantime she is been in hospital with apparently an upper GI bleed on Plavix and aspirin. She is now just on Plavix she received 3 units of packed cells 08/23/16; since I last saw this 3 weeks ago, the open large area on her right leg looks about the same syrup. She has a small satellite lesion just underneath this. The area on her medial right ankle is now a deep necrotic wound. I attempted to debridement this however there is just too much pain. 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 She follows with vascular surgery and is scheduled for an angiogram in early September 09/06/16; the patient is going for an arteriogram tomorrow. Her original large wound on the right calf is about the same the satellite lesion underneath it is about the same however the area on her medial ankle is now deeper with exposed tendon. I am no longer attempting to debride these wounds 09/20/16; the patient has undergone a  right femoral endarterectomy and Dacron patch angioplasty. This seems to have helped the flow in her right leg. 10/04/16; Arrived today for aggressive debridement of the wounds on her right calf the original wound the one beneath it and a difficult area over her right lateral leg just above the lateral malleolus. 10/25/16; her 3 open wounds are about the same in terms of dimensions however the surface appears a lot healthier post debridement. Using Iodoflex 10/18/16 we have been using Iodoflex to her wounds which she tolerated with some difficulty. 10/11/16; has been using Santyl for a period of time with some improvement although again very adherent surface slough would prevent any attempted healing this. She has a original wound on the left calf, the satellite underneath that and the most recent wound on the right medial ankle. She has completed revascularization by Dr. early and has had venous ablation earlier. Want to go back to Iodoflex to see if week and get a healthier surface to this wound bed failing this I think she'll need to be taken to the OR and I am prepared to call Dr. Marla Roe to discuss this. She is obviously not a good candidate for general anesthesia however.; 11/08/16; I put her on Iodoflex last time to see if I can get the wound bed any healthier and unfortunately today still had tremendous surface slough. 11/15/16; 4 weeks' worth of Iodoflex with not much improvement. Debridement on the major wounds on her left anterior leg is easier however this does not maintain from week to week. The punched-out area on her medial right ankle 11/29/16; I attempted to change the patient last visit to [facility] however she states this burned and was very uncomfortable therefore we gave her permission to go back to the eye out of complex which she already had at home. Also she noted a lot of pain and swelling on the lateral aspect of her leg before she traveled to [facility]  for the holidays, I called her in doxycycline over the phone. This seems to have helped 12/06/16; Wounds unchanged by in large. Using Iodoflex 12/13/16; her wounds today actually  looks somewhat better. The area on the right lateral lower leg has reasonably healthy-looking granulation and perhaps as actually filled and a bit. Debridement of the 2 wounds on the medial calf is easier and post debridement appears to have a healthier base. We have referred her to Dr. Migdalia Dk for consideration of operative debridement 12/20/16; we have a quick note from Dr. Merri Ray who feels that the patient needs to be referred to an academic center/plastic surgeon. This is due to the complexity of the patient's medical issues as it applies to anything in the OR. We have been using Iodoflex 12/27/16; in general the wounds on the right leg are better in terms of the difficult to remove surface slough. She has been using Iodoflex. She is approved for Apligraf which I anticipated ordering in the next week or 2 when we get a better-looking surface 01/04/16 the deep wounds on the right leg generally look better. Both of them are debrided further surface slough. The area on the lateral right leg was not debrided. She is approved for Apligraf I think I'll probably order this either next week or the week after depending on the surface of the wounds superiorly. We have been using Iodoflex which will continue until then 01/11/16; the deep wounds on the right leg again have a surface slough that requires debridement. I've not been able to get the wound bed on either one of these wounds down to what would be acceptable for an advanced skin stab-like Apligraf. The area on the medial leg has been improving. We have been using hot Iodoflex to all wounds which seems to do the best at at least limiting the nonviable surface 01/24/17; we have continued Iodoflex and all her wound areas. Her debridement Gen. he is easier and the surface  underneath this looks viable. Nevertheless these are large area wounds with exposed muscle at least on the anterior parts. We have ordered Apligraf's for 2 weeks from now. The patient will be away next week 02/07/17; the patient was close to have first Apligraf today however we did not order one. I therefore replaced her Iodoflex. She essentially has 3 large punched- out areas on her right anterior leg and right medial ankle. 02/11/17; Apligraf #1 02/25/17; Apligraf #2. In general some improvement in the right medial ankle and right anterior leg wounds. The larger wound medially perhaps some better 03/11/17; Apligraf #3. In general continued improvement in the right medial ankle and the right anterior leg wounds. 03/25/17 Apligraf #4. In general continued improvement especially on the right medial ankle and the lower 04/08/17; Apligraf #5 in general continued improvement in all wound sites. 04/22/17; post Apligraf #5 her wound beds continued to look a lot better all of them up to the surface of the surrounding skin. Had a caramel-colored slough that I did not debrided in case there is residual Apligraf effect. The wounds are as good as I have seen these looking quite some time. 04/29/17; we applied [facility] and last week after completing her Apligraf. Wounds look as though they've contracted somewhat although they have a nonviable surface which was problematic in the past. Apparently she has been approved for further Apligraf's. We applied Iodosorb today after debridement. 05/06/17; we're fortunate enough today to be able to apply additional Apligraf approved by her insurance. In general all of her wounds look better Apligraf #6 05/24/17; Apligraf #7 continued improvement in all wounds o3 06/10/17 Apligraf #8. Continued improvement in the surface of all wounds. Not much of  an improvement in dimensions as I might a follow 06/24/17 Apligraf #9 continued general improvement although not as much change in the  wound areas I might of like. She has a new open area on the right anterior lateral ankle very small and superficial. She also has a necrotic wound on the tip of her right index finger probably secondary to severe uncontrolled Raynaud's phenomenon. She is already on sildenafil and already seen her rheumatologist who gave her Keflex. 07/08/17; Apligraf #10. Generalized improvement although she has a small additional wound just medial to the major wound area. 07/22/17; after discussion we decided not to reorder any further Apligraf's although there is been considerable improvement with these it hasn't been recently. The major wound anteriorly looks better. Smaller wounds beneath this and the more recent one and laterally look about the same. The area on the right lateral lower leg looks about the same 07/29/17 this is a patient who is exceedingly complex. She has advanced scleroderma, crest syndrome including calcinosis, PAD status post revascularization, chronic venous insufficiency status post ablations. She initially presented to this clinic with wounds on her bilateral lower legs however these closed. More recently we have been dealing with a large open area superiorly on the right anterior leg, a smaller wound underneath this and then another one on the right medial lower extremity. These improve significantly with 10 Apligraf applications. Over the last 2-3 weeks we are making good progress with Hydrofera Blue and these seem to be making progress towards closure 08/19/17; wounds continued to make good improvement with Hydrofera Blue and episodic aggressive debridements 08/26/17; still using Hydrofera Blue. Good improvements 09/09/17; using Hydrofera Blue continued improvement. Area on the lateral part of her right leg has only a small remaining open area. The small inferior satellite region is for all intents and purposes closed. Her major wound also is come in in terms of depth and has advancing  epithelialization. 09/16/17; using Hydrofera Blue with continued improvement. The smaller satellite wound. We've closed out today along with a new were satellite wound medially. The area on her medial ankle is still open and her major wound is still open but making improvement. All using Hydrofera Blue. Currently 09/30/17; using Hydrofera Blue. Still a small open area on the lateral right ankle area and her original major wound seems to be making gradual and steady improvement. 10/14/17; still using Hydrofera Blue. Still too small open areas on the right lateral ankle. Her original major wound is horizontal and linear. The most problematic area paradoxically seems to be the area to the medial area wears I thought it would be the lateral. 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 10/28/17; still using Hydrofera Blue. Right lateral ankle has a very small open area superiorly on the most lateral part of the wound. Her original open wound has 2 open areas now separated by normal skin and we've redefined this. 11/11/17; still using Hydrofera Blue area and right lateral ankle continues to have a small open area on mostly the lateral part of the wound. Her original wound has 2 small open areas now separated by a considerable amount of normal skin 11/28/17; the patient called in slightly before Thanksgiving to report pain and erythema above the wound on the right leg. In the past this is responded well to treatment for cellulitis and I gave her over the phone doxycycline. She stated this resulted in fairly abrupt improvement. We have been using Hydrofera Blue  for a prolonged period of time to the larger wound anteriorly into the remaining wound on her right right lateral ankle. The latter is just about closed with only a small linear area and the bottom of the [place]. 12/02/17; use endoform and left the dressing on since last visit. There is no tenderness and no  evidence of infection. 12/16/17; patient has been using Endoform but not making much progress. The 2 punched out open areas anteriorly which were the reminiscence of her major wound appear deeper. The area on the lateral aspect of her right calf also appears deeper. Also she has a puzzling tender swelling above her wound on the right leg. This seems larger than last time. Just above her wounds there appears to be some fluctuance in this area it is not erythematous and there is no crepitus 12/30/17; patient has been using Endoform up until last week we used Hydrofera Blue. Ultrasound of the swelling above her 2 major wounds last time was negative for a fluid collection. I gave her cefaclor for the erythema and tenderness in this area which seems better. Unfortunately both punched out areas anteriorly and the area on her right medial lower leg appear deeper. In fact the lateral of the wounds anteriorly actually looks as though it has exposed tendon and/or muscle sheath. She is not systemically unwell. She is complaining of vaginitis type symptoms presumably Candida from her antibiotics. 01/06/18; we're using santyl. she has 2 punched out areas anteriorly which were initially part of a large wound. Unfortunately medially this is now open to tendon/muscle. All the wounds have the same adherent very difficult to debride surface. 01/20/18; 2 week follow-up using Santyl. She has the 2 punched out areas anteriorly which were initially part of her large surface wound there. Medially this still has exposed muscle. All of these have the same tightly adherent necrotic surface which requires debridement. PuraPly was not accepted by the patient's insurance however her insurance I think it changed therefore we are going to run Apligraf to gain 02/03/18; the patient has been using Santyl. The wound on the right lateral ankle looks improved and the 2 areas anteriorly on the right leg looks about the same. The medial one has  exposed muscle. The lateral 1 requiresdebridement. We use PuraPly today for the first week 02/10/18; PuraPly #2. The patient has 3 wounds. The area on the right lateral ankle, 2 areas anteriorly that were part of her original large wound in this area the medial one has exposed muscle. All of the wounds were lightly debrided with a number 3 curet. PuraPly #2 applied the lateral wound on the calf and the right lateral ankle look better. 02/17/18; PuraPly #3. Patient has 3 wounds. The area on the right lateral ankle in 2 areas internally that were part of her original large wound. The lateral area has exposed muscle. She arrived with some complaints of pain around the right ankle. 02/24/18; PuraPly #4; not much change in any of the 3 wound areas. Right lateral ankle, right lateral calf. Both of these required debridement with a #3 curet. She tolerates this marginally. The area on the medial leg still has exposed muscle. Not much change in dimensions 03/03/18 PuraPly #5. The area on the medial ankle actually looks better however the 2 separate areas that were original parts of the larger right anterior leg wound look as though they're attempting to coalesce. 03/10/18; PuraPly #6. The area on the medial ankle actually continues to look and measures smaller  however the 2 separate areas that were part of the original large wound on the right anterior leg have now coalesced. There hasn't been much improvement here. The lateral area actually has underlying exposed muscle 03/17/18-she is here in follow-up evaluation for ulcerations to the right lower extremity. She is voicing no complaints or concerns. She tolerated debridement. Puraply#7 placed 03/24/18; difficult right lower extremity ulcerations. PuraPly #8 place. She is been approved for [employer]. She did very well with Apligraf today however she is apparently reached her "lifetime max" 03/31/18; marginal improvement with PuraPly although her wounds looked as good  as they have in several weeks today. Used TheraSkins #1 04/14/18 TheraSkin #2 today 04/28/18 TheraSkins #3. Wound slightly improved 05/12/18; TheraSkin skin #4. Wound response has been variable. 05/27/18 TheraSkin #5. Generally improvement in all wound areas. I've also put her in 3 layer compression to help with the severe venous hypertension 06/09/18; patient is done quite well with the TheraSkins unfortunately we have no further applications. I also put her in 3 layer compression last week and that really seems to of helped. 06/16/18; we have been using silver collagen. Wounds are smaller. Still be open area to the muscle layer of her calf however even that is contracted somewhat. She tells me that at night sometimes she has pain on the right lateral calf at the site of her lower wound. Notable that I put her into 3 layer compression about 3 weeks ago. She states that she dangles her leg over the bed that makes it feel better but she does not describe claudication during the day ooShe is going to call her secondary insurance to see if they will continue to cover advanced treatment products I have reviewed her arterial studies from 01/22/17; this showed an ABI in the right of 1 and on the left noncompressible. TBI on the right at 0.30 on the left at 0.34. It is therefore possible she has significant PAD with medial calcification falsely elevating her ABI into the normal range. I'll need to be careful about asking her about this next week it's possible the 3 layer compression is too much 06/23/18; was able to reapply TheraSkin 1 today. Edema control is good and she is not complaining of pain no claudication 07/07/18;no major change. New wound which was apparently a taper removal injury today in our clinic between her 2 wounds on the right calf TheraSkins #2 07/14/18; I think there is some improvement in the right lateral ankle and the medial part of her wound. There is still exposed muscle medially. 07/28/18;  two-week follow-up. TheraSkins#3. Unfortunately no major change. She is not a candidate I don't think for skin grafting due to severe venous hypertension associated with her scleroderma and pulmonary hypertension 08/11/18 Patient is here today for her [employer] application #78 (#5 of the second set). She seems to be doing well and in the base of the wound appears to show some progress at this point. This is the last approved [employer] of the second set. 08/25/18; she has completed TheraSkin. There has been some improvement on the right lateral calf wound as well as the anterior leg wounds. The open area to muscle medially on the anterior leg wound is smaller. I'm going to transition her back to [facility] under Kerlix Coban change every second day. She reports that she had some calcification removed from her right upper arm. We have had previous problems with calcifications in her wounds on her legs but that has not happened recently 09/08/18;using  Hydrofera Blue on both her wound areas. Wounds seem to of contracted nicely. She uses Kerlix Coban wrap and changes at home herself 09/22/2018; using Hydrofera Blue on both her wound areas. Dimensions seem to have come down somewhat. There is certainly less depth in the medial part of the mid tibia wound and I do not think there is any exposed muscle at this point. 10/06/2018; 2-week follow-up. Using [facility] on her wound areas. Dimensions have come down nicely both on the right lateral ankle area in the right mid tibial area. She has no new complaints 10/20/2018; 2-week follow-up. She is using Hydrofera Blue. Not much change from the last time she was here. The area on the lateral ankle has less depth although it has raised edges on one side. I attempted to remove as much of the raised edge as I could without creating more additional wounds. The area on the right anterior mid tibia area looks the same. 11/03/2018; 2-week follow-up she is using  Hydrofera Blue. On the right anterior leg she now has 2 wounds separated by a large area of normal skin. The area on the medial part still has I think exposed muscle although this area itself is a lot smaller. The area laterally has some depth. Both areas with necrotic debris. ooThe area on her right lateral ankle has come in nicely 11/17/2018; patient continues to use Hydrofera Blue. We have been increasing separation of the 2 wounds anteriorly which were at one point joint the area on the right lateral calf continues to have I think some improvement in depth. 12/08/2018; patient continues to use Hydrofera Blue. There is some improvement in the area on the right lateral calf. The 2 areas that were initially part of the original large wound in the mid right tibia are probably about the same. In fact the medial area is probably somewhat larger. We will run puraply through the patient's insurance 12/22/2018; she has been using Hydrofera Blue. We have small wounds on the right lateral calf and 2 small areas that were initially part of a large wound in the right mid tibia. We applied pure apply #1 today. 12/29/2018; we applied puraply #2. Her wounds look somewhat better especially on the right lateral calf and the lateral part of her original wound in the mid tibial area. 01/05/2019; perhaps slightly improved in terms of wound bed condition but certainly not as much improvement as I might of liked. Puraply #3 1/13: we did not have a correct sized puraply to apply. wounds more pinched out looking, I increased her compression to 3 layer last week to help with significant multilevel venous hypertension. Since then I've reviewed her arterial status. She has a right femoral endarterectomy and a distal left SFA stent. She was being followed by Dr. [name] for a period however she does not appear to have seen him in 3 years. I will set up an appointment. 1/20. The patient has an additional wound on the right  lateral calf between the distal wound and proximal wounds. We did not have Puraply last week. Still does not have a follow-up with Dr. early 1/27: Follow-up with Dr. early has been arranged apparently with follow-up noninvasive studies. Wounds are measuring roughly the same although they certainly look smaller 2/3; the patient had non invasive studies. Her ABIs on the right were 0.83 and on the left 1.02 however there was no great toe pressure bilaterally. Also worrisome monophasic waveforms at the PTA and dorsalis pedis. We are still  using Puraply. We have had some improvement in all of the wounds especially the lateral part of the mid tibial area. 2/10; sees Dr. early of vein and vascular re-arterial studies next week. Puraply reapplied today. 2/17; Dr. early of vein and vascular his appointment is tomorrow. Puraply reapplied after debridement of all wounds 2/24; the patient saw Dr. early I reviewed his note. sHe noted the previous right femoral endarterectomy with a Dacron patch. He also noted the normal ABI and the monophasic waveforms suggesting tibial disease. Overall he did not feel that she had any evidence of arterial insufficiency that would impair her wound healing. He did note her venous disease as well. He suggested PRN follow-up. 3/2; I had the last puraply applied today. The original wounds over the mid tibia area are improved where is the area on the right lower leg is not 3/9; wounds are smaller especially in the right mid tibia perhaps slightly in the right lateral calf. We finished with puraply and went to endoform today 3/23; the patient arrives after 2 weeks. She has been using endoform. I think all of her wounds look slightly better which includes the area on the right lateral calf just above the right lateral malleolus and the 2 in the right mid tibia which were initially part of the same wound. 4/27; TELEHEALTH visit; the patient was seen for telehealth visit today with her  consent in the middle of the worldwide epidemic. Since she was last here she called in for antibiotics with pain and tenderness around the area on the right medial ankle. I gave her empiric doxycycline. She states this feels better. She is using endoform on both of these areas 5/11 TELEHEALTH; the patient was seen for telehealth visit today. She was accompanied at home by her husband. She has severe pulmonary hypertension accompanied scleroderma and in the face of the Covid epidemic cannot be safely brought into our clinic. We have been using endoform on her wound areas. There are essentially 3 wound areas now in the left mid tibia now 2 open areas that it one point were connected and one on the right lateral ankle just above the malleolus. 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 She cannot come to clinic. The wounds were reviewed today via telehealth. She has severe chronic venous hypertension which I think is centrally mediated. She has wounds on her right anterior tibia and right lateral ankle area. These are chronic. She has been using endoform. 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 We have been following her from telehealth. She has severe chronic venous hypertension which may be mostly centrally mediated secondary to right heart heart failure. She has wounds on her right anterior tibia and right lateral ankle these are chronic we have been using endoform 6/22; TELEHEALTH; this patient was seen today via telehealth. 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 Since the last time we had contact  with this patient she developed some pain and erythema around the wound on her right lateral malleolus/ankle and we put in antibiotics for her. This is resulted in good improvement with resolution of the erythema and  tenderness. I changed her to silver alginate last time. We had been using endoform for an extended period of time 7/13; TELEHEALTH; this patient was seen today via telehealth. 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 She would continue to be at a prohibitive risk to be brought into our clinic unless this was absolutely necessary. These visits have been done with her approval as well as her husband. We have been using silver alginate to the areas on the right mid tibia and right lateral lower leg. 7/27 TELEHEALTH; patient was seen along with her husband today via telehealth. 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 We changed her to sample last visit. She has 2 areas a chronic wound on her right mid tibia and one just above her ankle. These were not the original wounds when she came into this clinic but she developed them during treatment 8/17; she comes in for her first face-to-face visit in a long period. She has a remaining area just medial to the right tibia which is the last open part of her large wound across this area. She also has an area on the right lateral lower leg. We prescribed Santyl last telehealth visit but they were concerned that this was making a deeper so they put silver alginate on it last week. Her husband changes the dressings. 8/31; using Santyl to the 2 wound areas some improvement in wound surfaces. Husband has surgery in 2 weeks we will put her out 3 weeks. Any of the advanced treatment options that I can think of that would be eligible for this wound would also cause her to have to come in weekly. The risk that the patient is just  too high 9/21. Using Santyl to the 2 wound areas. Both of these are somewhat better although the medial mid tibia area still has exposed muscle. Lateral ankle requiring debridement. Using Santyl 10/12; using Santyl to the 2 wound areas. One on the right lateral ankle and the other in the medial calf which still has exposed muscle. Both areas have come down in size and have better looking surfaces. She has made nice progress with santyl 11/2; we have been using Santyl to the 2 wound areas. Right lateral ankle and the other in the mid tibia area the medial part of this still has exposed muscle. 11/23/2019 on evaluation today patient appears to be doing about the same really with regard to her wounds. She is actually not very pleased with how things seem to be progressing at this point she tells me that she really has not noted much improvement unfortunately. With that being said there is no signs of active infection at this time. There is some slough buildup noted at this time which again along with some dry skin around the edges of the wound I think would benefit her to try to debride some of this away. Fortunately her pain is doing fairly well. She still has exposed muscle in the right medial/tibial area. 12/14; TELEHEALTH; she was changed to [facility] to the right calf wound and right lateral ankle wound when she was here last time. Unfortunately since then she had a fall with a pelvic fracture and a fracture of her wrist. She was apparently hospitalized for 5 days. I have not looked at her discharge summary. She apparently came out of the hospital with a blister on her right heel. She  was seen today via telehealth by myself and our case manager. The patient and her husband were present. She has been using Hydrofera Blue at our direction from the last time she was in the clinic. There is been no major improvement in fact the areas appear deeper and with a less viable surface 01/04/2020;  TELEHEALTH; the patient was seen today in accompaniment of her husband and our nurse. She has 3 open areas 1 on the right medial mid tibia, one on the right lateral ankle and a large eschared pressure area on the right heel. We have been using Iodoflex to the 2 original wounds. 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 It is simply too perilous for her to be seen in any other way 1/26; TELEHEALTH; the patient was seen today in accompaniment of her husband and our RN one of our nurses. She still has the 3 open areas 1 on the right medial mid tibia which is the remanent of a more extensive wound in this area, one on the right lateral ankle and a large eschared pressure area on the right heel. We have been using Iodoflex to the 2 original wounds and a bed at 9 application to the eschared area on the heel 2/15; the first time we have seen this patient and then several months out of concern for the pandemic. She had a large horizontal wound in the mid tibia. Only the medial aspect of this is still open. Area on the lateral ankle is just about closed. She had a new pressure ulcer on the right heel which I have removed some of the eschar. We have been using Iodoflex which I will continue. The area in the mid tibia has a round circle in the middle of exposed muscle. I think we would have to use an advanced treatment product to stimulate granulation over this area. We will run this through her insurance. She is not eligible for plastic surgery for many reasons 3/1; TELEHEALTH. The patient was seen today by telehealth. 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 We have been using Iodoflex to the wound areas which include the right anterior mid tibia, right lateral ankle and the right heel. All of these were reviewed 3/8; the patient's wound just above the right ankle is closed. She  still has a contracting black eschar on the heel where she had a pressure ulcer. The medial part of her original wound on the mid tibia has exposed muscle. We have made really made no progress in this area although we have managed to get a lot of the original wound in this area to close. We used Apligraf #1 today 3/22; the patient's ankle wound remains closed. She still has a contracting black eschar on the heel although it seems to have less surface area. It still not clear whether there is any depth here. We used Apligraf #2 today in an attempt to get granulation over the exposed muscle and what is left of her mid tibia area. 4/5; everything is closed except the medial aspect of the mid tibia wound, as well as the pressure injury on the right heel. She has been using Betadine to the right heel which has been gradually contracting. We used Apligraf #3 today. 4/19; Apligraf #4. Still a pressure area on the right heel she has been using Betadine superficial excoriated areas she has been applying salicylic acid to on  the anterior leg and the thick horn on the leg just above her wound area 5/3; Apligraf #5. Unfortunately she came in today with a reopening of the area on the right lateral lower leg. Not much change in the wound we have been treating in the mid calf. Quite a bit of edema surrounding this wound. She reports that she was up on her feet quite a bit. The area on the right heel is separating she has been using Betadine She comes in with a new wound on the left lateral malleolus which is very disappointing this is been open since last week 5/17; we applied her last Apligraf last time. Unfortunately that did not have any effect on the deep area medially in the mid tibial area. However the area over the right lateral malleolus is a lot better. Right heel also is contracted we used Iodoflex last time. The new wound on the left lateral malleolus were using Santyl to. This required  debridement. 6/1. Not much change in the area on the right medial mid tibia. Right lateral ankle is improved she has the necrotic area on the right heel which was a pressure area. New area on the left lateral malleolus from last time. I changed her to Iodoflex to the area on the left lateral malleolus she said this hurt I have put her back on Santyl 6/15; right mid tibia is unchanged. I do not think there is anything I can do to this topically that we will get this to granulate over the muscle. And I am furthermore I am not sure that she is a surgical candidate either because of her severe pulmonary issues or because she really does not want to go through it. ooThe area on the right lateral malleolus is a small punched out area. ooThe area on the left lateral malleolus again small punched out with nonviable surface ooPressure ulcer on the right heel at separating black eschar. I went ahead and remove this today Lura Em has an area on the left anterior mid tibia just laterally. Geographic wounds debris on the surface. Some erythema here. 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 There is skin changes widely Change the primary dressing to Sorbact to all wound areas. She needs compression on the left leg as well as the right 6/21; right mid tibia which was her one remaining wound at 1 point is unchanged ooThe area on the right lateral malleolus actually is close to closing over still a small punched out area ooThe area on the left lateral malleolus again a deep wound it is this 1 that she feels pain in. ooPressure ulcer on the right heel was cultured today. ooNew area on the left anterior mid tibia several geographic wounds last week in the setting of chronic venous insufficiency this actually looks some better 7/6; ooRight mid tibia about the same. Exposed muscle ooLeft lateral malleolus again necrotic debris over the surface. ooPressure ulcer on the right heel. She  finished the Keflex we prescribed. Necrotic debris debrided with a #3 curette ooThe rest of her wounds on the right mid tibia right lateral ankle are better Her husband reminds me that she had a right femoral endarterectomy and has 2 stents in her left thigh placed in 2011 approximately by vascular surgery in [place]. She saw Dr. [name] last in February 2020. At that point he did not think that the arterial insufficiency she had on the right was sufficient to explain any of her right lower extremity wounds  however she now has an area on the left lateral malleolus that looks like an ischemic wound 7/12; we have been using Sorbact all wound areas ooRight mid tibia about the same exposed muscle ooLeft lateral malleolus small wound with debris in the surface punched out ooPressure ulcer of the right heel requiring debridement of necrotic debris ooLateral ankle wound on the right is just about closed ooRight mid tibia wound about the same still with exposed muscle. 7/26 really no change in any wounds. We have been using Sorbact. She has bilateral lower extremity wounds. This is in the setting of scleroderma, severe venous hypertension. She also has known PAD and I had really hoped to be able to get a review by Dr. [name] but apparently that is not booked until August 31. I have asked her to call back to vein and vascular and get an appointment with somebody a little earlier if that is possible. 8/16; patient's area on the right ankle which was a chronic wound is closed over again. The area on the right mid lower leg, right heel left lateral ankle are all about the same. Pressure ulcer on the right heel is still not viable. New areas identified on the right lateral calf. She has a follow-up with Dr. [name] on 8/31. Phenomenally I would like him to go over her arterial status with regards to her underlying stents. Objective Constitutional Vitals Time Taken: 11:55 AM, Height: 68 in, Weight: 132 lbs,  BMI: 20.1, Temperature: 97.8 F, Pulse: 67 bpm, Respiratory Rate: 19 breaths/min, Blood Pressure: 186/61 mmHg. Cardiovascular Pedal pulses are palpable but difficult to feel. Edema in the lower extremities is well controlled. General Notes: Wound exam ooMidportion of the right tibia still has exposed muscle right lateral ankle wound I think is closed over. She has the pressure ulcer on the tip of her right heel which is still necrotic. She has some degree of skin breakdown on the right lateral calf although I do not know that this is technically open ooLeft lateral malleolus again and nonviable base Integumentary (Hair, Skin) Thick skin in her lower extremities compatible with scleroderma. Wound #16 status is Open. Original cause of wound was Pressure Injury. The wound is located on the Right Calcaneus. The wound measures 0.9cm length x 0.3cm width x 0.7cm depth; 0.212cm^2 area and 0.148cm^3 volume. There is Fat Layer (Subcutaneous Tissue) Exposed exposed. There is no tunneling or undermining noted. There is a small amount of purulent drainage noted. The wound margin is flat and intact. There is small (1-33%) pink granulation within the wound bed. There is a large (67-100%) amount of necrotic tissue within the wound bed including Adherent Slough. Wound #19 status is Open. Original cause of wound was Gradually Appeared. The wound is located on the Right,Lateral Malleolus. The wound measures 0.2cm length x 0.2cm width x 0.3cm depth; 0.031cm^2 area and 0.009cm^3 volume. There is Fat Layer (Subcutaneous Tissue) Exposed exposed. There is no tunneling or undermining noted. There is a small amount of serous drainage noted. The wound margin is flat and intact. There is no granulation within the wound bed. There is a large (67-100%) amount of necrotic tissue within the wound bed including Adherent Slough. Wound #20 status is Open. Original cause of wound was Gradually Appeared. The wound is located on the  Left,Lateral Malleolus. The wound measures 0.7cm length x 0.7cm width x 0.5cm depth; 0.385cm^2 area and 0.192cm^3 volume. There is Fat Layer (Subcutaneous Tissue) Exposed exposed. There is no tunneling or undermining noted. There  is a small amount of purulent drainage noted. The wound margin is well defined and not attached to the wound base. There is small (1-33%) pink granulation within the wound bed. There is a large (67-100%) amount of necrotic tissue within the wound bed including Adherent Slough. Wound #21 status is Healed - Epithelialized. Original cause of wound was Gradually Appeared. The wound is located on the Left,Lateral Lower Leg. The wound measures 0cm length x 0cm width x 0cm depth; 0cm^2 area and 0cm^3 volume. There is no tunneling or undermining noted. There is a none present amount of drainage noted. The wound margin is distinct with the outline attached to the wound base. There is no granulation within the wound bed. There is no necrotic tissue within the wound bed. Wound #23 status is Open. Original cause of wound was Gradually Appeared. The wound is located on the Right,Posterior Lower Leg. The wound measures 1.3cm length x 1.1cm width x 0.1cm depth; 1.123cm^2 area and 0.112cm^3 volume. There is Fat Layer (Subcutaneous Tissue) Exposed exposed. There is no tunneling or undermining noted. There is a small amount of purulent drainage noted. The wound margin is distinct with the outline attached to the wound base. There is no granulation within the wound bed. There is a large (67-100%) amount of necrotic tissue within the wound bed including Adherent Slough. Wound #5 status is Open. Original cause of wound was Gradually Appeared. The wound is located on the Right,Medial Lower Leg. The wound measures 2cm length x 1.6cm width x 0.3cm depth; 2.513cm^2 area and 0.754cm^3 volume. There is Fat Layer (Subcutaneous Tissue) Exposed exposed. There is no tunneling or undermining noted. There is  a medium amount of purulent drainage noted. The wound margin is distinct with the outline attached to the wound base. There is small (1-33%) pink granulation within the wound bed. There is a large (67-100%) amount of necrotic tissue within the wound bed including Adherent Slough. 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 Dressing Change Frequency: Change Dressing every other day. - all wounds Skin Barriers/Peri-Wound Care: Moisturizing lotion - both legs Wound Cleansing: May shower with protection. Primary Wound Dressing: Wound #16 Right Calcaneus: Cutimed Sorbact Wound #19 Right,Lateral Malleolus: Cutimed Sorbact Wound #23 Right,Posterior Lower Leg: Cutimed Sorbact Wound #20 Left,Lateral Malleolus: Santyl Ointment Wound #5 Right,Medial Lower Leg: Cutimed Sorbact Secondary Dressing: Wound #16 Right Calcaneus: Dry Gauze Wound #19 Right,Lateral Malleolus: Dry Gauze Wound #20 Left,Lateral Malleolus: Dry Gauze Wound #5 Right,Medial Lower Leg: Dry Gauze Edema Control: Kerlix and Coban - Bilateral Avoid standing for long periods of time Elevate legs to the level of the heart or above for 30 minutes daily and/or when sitting, a frequency of: - throughout the day Off-Loading: Turn and reposition every 2 hours Other: - float heels off of bed/chair with pillow under calves 1. I continued Sorbact to the right heel, right lateral malleolus and the right anterior lower leg 2. Left ankle  wound we have been using Santyl. 3. I am waiting for arterial review before considering further debridement of the right heel and left lateral ankle. 4.  [facility] asked about plastic surgery using a skin graft site from her torso however I am not exactly sure whether they would consider this. Certainly I would wait till after we do the vascular evaluation Electronic Signature(s) Signed: 08/15/2020 5:53:04 PM By: Linton Ham MD Entered By: Linton Ham on 08/15/2020 17:45:54 -------------------------------------------------------------------------------- SuperBill Details Patient Name: Date of Service: [name], Yolanda Manges. 08/15/2020 Medical Record Number: [id number] Patient Account Number: [id number] Date of Birth/Sex: Treating RN: 06-Sep-1951 (69 y.o. 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 I87.331 Chronic venous hypertension (idiopathic) with ulcer and inflammation of right lower extremity L89.616 Pressure-induced deep tissue damage of right heel L97.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 L97.828 Non-pressure chronic ulcer of other part of left lower leg with other specified severity Facility Procedures CPT4 Code: 21975883 Description: 25498 - WOUND CARE VISIT-LEV 5 EST PT Modifier: Quantity: 1 Physician Procedures : CPT4 Code Description Modifier [id number] - WC PHYS LEVEL 3 - EST PT ICD-10 Diagnosis Description L97.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 L97.811 Non-pressure  chronic ulcer of other part of right lower leg limited to  breakdown of skin L97.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 L89.616 Pressure-induced deep tissue damage of right heel Quantity: 1 Electronic Signature(s) Signed: 08/15/2020 5:53:04 PM By: Linton Ham MD Previous Signature: 08/15/2020 5:14:32 PM Version By: Levan Hurst RN, BSN Entered By: Linton Ham on 08/15/2020 17:46:22

## 2020-08-16 ENCOUNTER — Other Ambulatory Visit: Payer: Self-pay

## 2020-08-16 ENCOUNTER — Ambulatory Visit (HOSPITAL_COMMUNITY)
Admission: RE | Admit: 2020-08-16 | Discharge: 2020-08-16 | Disposition: A | Discharge status: Home / Self Care | Payer: Medicare Other | Source: Ambulatory Visit | Attending: Internal Medicine | Admitting: Internal Medicine

## 2020-08-16 VITALS — BP 168/66 | Wt 125.1 lb

## 2020-08-16 DIAGNOSIS — L97919 Non-pressure chronic ulcer of unspecified part of right lower leg with unspecified severity: Secondary | ICD-10-CM | POA: Diagnosis not present

## 2020-08-16 DIAGNOSIS — Z9221 Personal history of antineoplastic chemotherapy: Secondary | ICD-10-CM | POA: Diagnosis not present

## 2020-08-16 DIAGNOSIS — Z89021 Acquired absence of right finger(s): Secondary | ICD-10-CM | POA: Diagnosis not present

## 2020-08-16 DIAGNOSIS — I2721 Secondary pulmonary arterial hypertension: Secondary | ICD-10-CM | POA: Diagnosis not present

## 2020-08-16 DIAGNOSIS — M349 Systemic sclerosis, unspecified: Secondary | ICD-10-CM

## 2020-08-16 DIAGNOSIS — E039 Hypothyroidism, unspecified: Secondary | ICD-10-CM | POA: Insufficient documentation

## 2020-08-16 DIAGNOSIS — Z882 Allergy status to sulfonamides status: Secondary | ICD-10-CM | POA: Diagnosis not present

## 2020-08-16 DIAGNOSIS — M1990 Unspecified osteoarthritis, unspecified site: Secondary | ICD-10-CM | POA: Insufficient documentation

## 2020-08-16 DIAGNOSIS — Z8349 Family history of other endocrine, nutritional and metabolic diseases: Secondary | ICD-10-CM | POA: Insufficient documentation

## 2020-08-16 DIAGNOSIS — I10 Essential (primary) hypertension: Secondary | ICD-10-CM | POA: Diagnosis not present

## 2020-08-16 DIAGNOSIS — I2729 Other secondary pulmonary hypertension: Secondary | ICD-10-CM | POA: Diagnosis not present

## 2020-08-16 DIAGNOSIS — Z8572 Personal history of non-Hodgkin lymphomas: Secondary | ICD-10-CM | POA: Insufficient documentation

## 2020-08-16 DIAGNOSIS — Z79899 Other long term (current) drug therapy: Secondary | ICD-10-CM | POA: Insufficient documentation

## 2020-08-16 DIAGNOSIS — Z7952 Long term (current) use of systemic steroids: Secondary | ICD-10-CM | POA: Insufficient documentation

## 2020-08-16 DIAGNOSIS — J849 Interstitial pulmonary disease, unspecified: Secondary | ICD-10-CM | POA: Diagnosis not present

## 2020-08-16 DIAGNOSIS — Z85820 Personal history of malignant melanoma of skin: Secondary | ICD-10-CM | POA: Diagnosis not present

## 2020-08-16 DIAGNOSIS — Z880 Allergy status to penicillin: Secondary | ICD-10-CM | POA: Diagnosis not present

## 2020-08-16 DIAGNOSIS — Z7902 Long term (current) use of antithrombotics/antiplatelets: Secondary | ICD-10-CM | POA: Diagnosis not present

## 2020-08-16 DIAGNOSIS — I110 Hypertensive heart disease with heart failure: Secondary | ICD-10-CM | POA: Diagnosis not present

## 2020-08-16 DIAGNOSIS — I509 Heart failure, unspecified: Secondary | ICD-10-CM | POA: Diagnosis not present

## 2020-08-16 DIAGNOSIS — Z881 Allergy status to other antibiotic agents status: Secondary | ICD-10-CM | POA: Insufficient documentation

## 2020-08-16 DIAGNOSIS — I739 Peripheral vascular disease, unspecified: Secondary | ICD-10-CM

## 2020-08-16 NOTE — Progress Notes (Signed)
Advanced [facility] Clinic Note   Date:  08/16/2020   ID:  [name] [name], DOB 09-12-1951, MRN [id number]  Location: Home  Provider location: [place] [facility] Type of Visit: Established patient  PCP:  [name], MD  Cardiologist:  Glori Bickers, MD Primary HF: Cynthya Yam  Chief Complaint: Heart Failure follow-up   History of Present Illness:  [name] [name] is a 69 y.o. 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   PAH Meds: 1. Started on IV epoprostenol (Veletri) in 01/2013. She had intolerable side effects with Veletri.   2. Stopped Tyvaso in 2015. 3. February 2016 switched bosentan to Macitentan 4. Macitentan 10 and sildenafil 60 tid (did not tolerate Adcirca).  5. Selexipag added 5/18 - stopped due to SEs  On 10/22/19 underwent amputation of R 4th and 5th fingers. Says she is doing OK. Wounds seems to be healing but has some concern over possible staph infection at ring finger site. She has been concerned that she is on too much diuretics. Recently cut torsemide back to 40/20 and seems to be better. She is drinking more fluid now. Having some swelling in left calf. RLE is wrapped and no swelling. Not tender. No trauma to left leg.   Had a fall in November broke her pelvis and left wrist. Has non-healing wound on RLE. Follows at wound center. SBP 130-140s at home. Edema well controlled. No orthopnea or PND. No syncope. Having some confusion at times.   Echo 3/21: LVEF 60-65% RV mildly dilated but function normal Personally reviewed   Echo 01/16/2019 LVEF 60-65%, PA peak pressure 58 mm Hg  Echo 12/18 EF 60-65% RV dilated with mildly to moderately decreased function   Studies  6 MW 04/18/12 880 feet 6 MW 11/10/12 650 feet. Limited by legs. Sats down to 85% on 5L (sats at rest 95%)  6 MW 3/15 915  feet (279 m) 6 MW 2/16 930 feet (283 m)   RHC 04/19/17  RA = 4 RV = 80/8 PA = 79/17 (43) PCW = 6 Fick cardiac output/index = 4.1/2.4 PVR = 9.0 WU Ao sat = 94% PA sat = 59%, 62%    Past Medical History:  Diagnosis Date  . Anemia   . Arthritis   . DOE (dyspnea on exertion)   . Epistaxis   . History of chicken pox   . Hypertension   . Hypothyroidism   . Leg ulcer ([place])   . Melanoma ([place]) 1999  . 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[place])   . Non Hodgkin's lymphoma ([place])    Treated with chemo 2010, felt to be cured.  Marland Kitchen PAH (pulmonary artery hypertension) ([place])   . Peripheral arterial occlusive disease ([place])   . Pulmonary hypertension ([place])   . Requires supplemental oxygen    2 liters-3liters when moving  . 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 AMPUTATION Right 10/24/2017   Procedure: RAY resection right index;  Surgeon: Daryll Brod, MD;  Location: [place];  Service: Orthopedics;  Laterality: Right;  Axillary block  . AMPUTATION Right 11/21/2018   Procedure: AMPUTATION RAY RIGHT MIDDLE FINGER;  Surgeon: Daryll Brod, MD;  Location: [place];  Service: Orthopedics;  Laterality: Right;  . AMPUTATION Right 10/22/2019   Procedure: AMPUTATION DIGIT RIGHT RING AND SMALL FINGER, DIGITAL  SYMPATHECTOMY RIGHT RADIAL ULNAR ARCH;  Surgeon: Daryll Brod, MD;  Location: [place];  Service: Orthopedics;  Laterality: Right;  AXILARY  . apligraph  12-04-2102,   12-18-2012   bilateral calves   [name]  . biopsy on cerebellum    . COLONOSCOPY WITH PROPOFOL Left 10/07/2015   Procedure: COLONOSCOPY WITH PROPOFOL;  Surgeon: [name], MD;  Location: [place];  Service: Endoscopy;  Laterality: Left;  . ENDARTERECTOMY FEMORAL Right 09/14/2016   Procedure: ENDARTERECTOMY FEMORAL RIGHT;  Surgeon: [name], MD;  Location[facility];  Service: Vascular;  Laterality: Right;  . ENDOVENOUS ABLATION SAPHENOUS VEIN W/ LASER   07-24-2012   right greater saphenous vein by [name], M.D.   . ENDOVENOUS ABLATION SAPHENOUS VEIN W/ LASER  10-16-2012   left greater saphenous vein  by Dr. [name]  . ENDOVENOUS ABLATION SAPHENOUS VEIN W/ LASER Right 09-01-2015   endovenous laser ablation right greater saphenous vein by [name] MD  . ESOPHAGOGASTRODUODENOSCOPY N/A 05/07/2016   Procedure: ESOPHAGOGASTRODUODENOSCOPY (EGD);  Surgeon: [name], MD;  Location: [facility] ENDOSCOPY;  Service: Endoscopy;  Laterality: N/A;  . ESOPHAGOGASTRODUODENOSCOPY (EGD) WITH PROPOFOL Left 10/07/2015   Procedure: ESOPHAGOGASTRODUODENOSCOPY (EGD) WITH PROPOFOL;  Surgeon: [name], MD;  Location: [place];  Service: Endoscopy;  Laterality: Left;  . GIVENS CAPSULE STUDY N/A 07/31/2016   Procedure: GIVENS CAPSULE STUDY;  Surgeon: [name], MD;  Location: [facility] ENDOSCOPY;  Service: Endoscopy;  Laterality: N/A;  . melonoma removes Right    behind right knee  . OPEN REDUCTION INTERNAL FIXATION (ORIF) DISTAL RADIAL FRACTURE Left 11/24/2019   Procedure: OPEN REDUCTION INTERNAL FIXATION (ORIF) DISTAL RADIAL FRACTURE;  Surgeon: Altamese Michie, MD;  Location: [place];  Service: Orthopedics;  Laterality: Left;  . PATCH ANGIOPLASTY Right 09/14/2016   Procedure: PATCH ANGIOPLASTY RIGHT FEMORAL ARTERY USING HEMASHIELD PLATINUM FINESSE PATCH;  Surgeon: [name], MD;  Location: [place];  Service: Vascular;  Laterality: Right;  . PERIPHERAL VASCULAR CATHETERIZATION N/A 09/07/2016   Procedure: Abdominal Aortogram w/Lower Extremity;  Surgeon: [name], MD;  Location: [place] CV LAB;  Service: Cardiovascular;  Laterality: N/A;  . RIGHT HEART CATH N/A 04/19/2017   Procedure: Right Heart Cath;  Surgeon: [name], MD;  Location: [place] CV LAB;  Service: Cardiovascular;  Laterality: N/A;  . RIGHT HEART CATHETERIZATION N/A 11/14/2012   Procedure: RIGHT HEART CATH;  Surgeon: [name], MD;  Location: [facility] CATH LAB;  Service: Cardiovascular;   Laterality: N/A;  . rt little toe removal  10/1998  . stent left thigh  3/11  . TENDON TRANSFER Right 10/24/2017   Procedure: transfer extensor indecus propius/extensor digitorum commomus index to middle ring and small;  Surgeon: Daryll Brod, MD;  Location: [place];  Service: Orthopedics;  Laterality: Right;  . TONSILLECTOMY    . TONSILLECTOMY AND ADENOIDECTOMY    . TUNNELED VENOUS CATHETER PLACEMENT  01/2013  . ULNAR HEAD EXCISION Right 10/24/2017   Procedure: darrach right ulna;  Surgeon: Daryll Brod, MD;  Location: [place];  Service: Orthopedics;  Laterality: Right;  . VEIN LIGATION AND STRIPPING  05/16/2012   Procedure: VEIN LIGATION AND STRIPPING;  Surgeon: [name], MD;  Location: [facility] OR;  Service: Vascular;  Laterality: Right;  Irrigation and Debridement right lower leg, ligation of saphenous vein.     Current Outpatient Medications  Medication Sig Dispense Refill  . acetaminophen (TYLENOL) 500 MG tablet Take 1 tablet (500 mg total) by mouth every 12 (  twelve) hours. 30 tablet 0  . atorvastatin (LIPITOR) 20 MG tablet TAKE 1 TABLET BY MOUTH EVERY DAY 90 tablet 3  . Biotin 5000 MCG TABS Take 5,000 mcg by mouth daily.    Marland Kitchen CALCIUM-MAGNESIUM-VITAMIN D PO Take 2 tablets by mouth daily.    . Cholecalciferol (VITAMIN D) 2000 units CAPS Take 2,000 Units by mouth daily.    . citalopram (CELEXA) 20 MG tablet TAKE 1 TABLET BY MOUTH EVERYDAY AT BEDTIME 90 tablet 3  . clopidogrel (PLAVIX) 75 MG tablet TAKE 1 TABLET BY MOUTH EVERY DAY 90 tablet 3  . FERREX 150 150 MG capsule TAKE 1 CAPSULE BY MOUTH TWICE A DAY 180 capsule 1  . fish oil-omega-3 fatty acids 1000 MG capsule Take 1,000 mg by mouth 2 (two) times daily.     . folic acid (FOLVITE) 1 MG tablet Take 1 tablet (1 mg total) by mouth daily. 90 tablet 1  . levothyroxine (SYNTHROID) 50 MCG tablet TAKE 1 TABLET BY MOUTH DAILY BEFORE BREAKFAST 90 tablet 1  . loperamide (IMODIUM) 2 MG capsule Take by mouth as needed for diarrhea or loose stools.     . magnesium oxide (MAG-OX) 400 (241.3 Mg) MG tablet Take 0.5 tablets (200 mg total) by mouth 2 (two) times daily. 60 tablet 0  . Multiple Vitamin (MULTIVITAMIN WITH MINERALS) TABS tablet Take 1 tablet by mouth daily.    . OPSUMIT 10 MG tablet Take 1 tablet (75m) by mouth daily. 30 tablet 11  . OXYGEN Inhale 2-3 L into the lungs continuous.     . pantoprazole (PROTONIX) 40 MG tablet Take 1 tablet (40 mg total) by mouth 2 (two) times daily. 60 tablet 1  . potassium chloride 20 MEQ/15ML (10%) SOLN TAKE 2 TEASPOONFUL (DILUTED IN WATER) ONCE DAILY 473 mL 11  . predniSONE (DELTASONE) 5 MG tablet Take 5 mg by mouth daily.    . Probiotic CAPS Take 1 capsule by mouth daily.    . RESTASIS 0.05 % ophthalmic emulsion Place 1 drop into both eyes 2 (two) times daily.    .Marland Kitchensaccharomyces boulardii (FLORASTOR) 250 MG capsule Take 1 capsule (250 mg total) by mouth 2 (two) times daily. 60 capsule 0  . sildenafil (REVATIO) 20 MG tablet Take 3 tablets (60 mg total) by mouth 3 (three) times daily. Please call for office visit [phone] tablet 1  . spironolactone (ALDACTONE) 25 MG tablet TAKE 1 TABLET BY MOUTH EVERY DAY 90 tablet 0  . torsemide (DEMADEX) 20 MG tablet Take 40 mg by mouth 2 (two) times daily.    . valACYclovir (VALTREX) 500 MG tablet Take 1 tablet (500 mg total) by mouth 2 (two) times daily. 10 tablet 0  . vitamin B-12 (CYANOCOBALAMIN) 1000 MCG tablet Take 1,000 mcg by mouth daily.     No current facility-administered medications for this encounter.    Allergies:   Levofloxacin, Sulfa antibiotics, Levofloxacin in d5w, Doxycycline hyclate, and Penicillins   Social History:  The patient  reports that she has never smoked. She has never used smokeless tobacco. She reports current alcohol use. She reports that she does not use drugs.   Family History:  The patient's family history includes Cancer in her daughter; Colon cancer in an other family member; Hyperlipidemia in her mother; Lupus in her  father; Lymphoma in her father.   ROS:  Please see the history of present illness.   All other systems are personally reviewed and negative.   Vitals:   08/16/20 1306  BP: (!) 168/66  Weight: 56.8 kg (125 lb 2 oz)   Exam:   General:  Weak appearing. On O2 No resp difficulty HEENT: normal  Neck: supple. no JVD. Carotids 2+ bilat; no bruits. No lymphadenopathy or thryomegaly appreciated. Cor: PMI nondisplaced. Regular rate & rhythm. Soft TR. Lungs: clear with decreased BS throughout Abdomen: soft, nontender, nondistended. No hepatosplenomegaly. No bruits or masses. Good bowel sounds. Extremities: no cyanosis, clubbing, rash, LEs wrapped with UNNA boots. No significant edema. Diffuse scleroderma changes. Missing 3 fingers on left  Neuro: alert & orientedx3, cranial nerves grossly intact. moves all 4 extremities w/o difficulty. Affect pleasant   ECG: NSR 65 RV strain Personally reviewed  Recent Labs: 07/08/2020: ALT 9; BUN 40; Creat 1.44; Hemoglobin 10.0; Magnesium 1.8; Platelets 246; Potassium 4.2; Sodium 136; TSH 3.27  Personally reviewed   Wt Readings from Last 3 Encounters:  08/16/20 56.8 kg (125 lb 2 oz)  07/12/20 55.3 kg (122 lb)  04/28/20 58.7 kg (129 lb 6.4 oz)      ASSESSMENT AND PLAN:  1. 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  Unable to tolerate IV epoprostenol, Tyvaso or selexipag.  She remains on Revatio and macitentan. -  Stable NYHA III n macitentan 10 and revatio 60 tid -  RHC 4/18 as above -has now failed Selexipag as well as uptitration of her other PAH meds. - Echo 12/18. Mild RV strain. Unable to estimate RVSP due to lack of significant TR  - Echo 01/16/2019 LVEF 60-65%, PA peak pressure 58 mm Hg. RV looks normal (much improved) - Echo 3/21: LVEF 60-65% RV mildly dilated but function normal Personally reviewed - Volume status seems stable on torsemide 40 bid and spiro. Recent labs reviewed and ok.  - Continue home oxygen.  2. Scleroderma:  - Advanced. She is  followed by Dr. Amil Amen. - No changes 3. PAD with LE wounds: - Follows with Dr. [name] in  [facility]. Continue Plavix - Ambia  4. Interstitial lung disease:  - Secondary to scleroderma.  - Stable on chronic O2.  - Followed by Dr. Lamonte Sakai  Recent chest CT reviewed and is stable 6. HTN - BP elevated here but ok at home - call me if SBP > 160 6. HL - worried about myalgias with atorva - will do start-stop test - if needs to switch willswitch to Crestor 5 MWF  Signed, Glori Bickers, MD  08/16/2020 1:37 PM  [facility] [place] [address] Heart and [place] [address] [phone] (office) [phone] (fax)

## 2020-08-16 NOTE — Patient Instructions (Signed)
Please call our office in February to schedule your follow up appointment with an echocardiogram.   Your physician has requested that you have an echocardiogram. Echocardiography is a painless test that uses sound waves to create images of your heart. It provides your doctor with information about the size and shape of your heart and how well your heart's chambers and valves are working. This procedure takes approximately one hour. There are no restrictions for this procedure.   Call our office If your systolic blood pressure (top number) is above 160 consistently   If you have any questions or concerns before your next appointment please send [place] a message through [place] or call our office at [phone].    TO LEAVE A MESSAGE FOR THE NURSE SELECT OPTION 2, PLEASE LEAVE A MESSAGE INCLUDING: . YOUR NAME . DATE OF BIRTH . CALL BACK NUMBER . REASON FOR CALL**this is important as we prioritize the call backs  [place] AS LONG AS YOU CALL BEFORE 4:00 PM   At the [facility], you and your health needs are our priority. As part of our continuing mission to provide you with exceptional heart care, we have created designated Provider Care Teams. These Care Teams include your primary Cardiologist (physician) and Advanced Practice Providers (APPs- Physician Assistants and Nurse Practitioners) who all work together to provide you with the care you need, when you need it.   You may see any of the following providers on your designated Care Team at your next follow up: Marland Kitchen Dr Glori Bickers . Dr Loralie Champagne . Darrick Grinder, NP . Lyda Jester, PA . Audry Riles, PharmD   Please be sure to bring in all your medications bottles to every appointment.

## 2020-08-20 NOTE — Patient Instructions (Signed)
It was a pleasure to see you today.  Stay safe and well and avoid crowds.  Continue current medications.  Magnesium is within normal limits.  Continue wound care with Dr. Dellia Nims.  Follow-up here in 6 months.

## 2020-08-20 NOTE — Progress Notes (Signed)
   Subjective:    [name], female    DOB: [birth year], 69 y.o.   MRN: [id number]  HPI 69 year old Female here today for 9-monthfollow-up.  She suffered a fall in November 2020 and sustained a left distal radial fracture requiring open reduction internal fixation by Dr. HMarcelino Scoton November 24.  She also suffered fractures of pelvis involving left side.  Inferior pubic rami.  She has had a slow recovery but is much better at this point.  Has chronic pulmonary hypertension and chronic respiratory failure requiring home oxygen.  History of scleroderma.  History of peripheral vascular disease and is previously been treated by Dr. EDonnetta Hutching  History of hypothyroidism.  History of essential hypertension-stable  Hearing loss but does not want to wear hearing aids.  Hospitalized in November with persistent diarrhea, volume depletion, hypocalcemia, hypokalemia and hypomagnesemia.  Is now on magnesium supplement.  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 KFredna Dow.  History of non-Hodgkin's lymphoma treated with chemotherapy in 2010 and felt to be cured.  Remote history of melanoma.  Sees Dr. BLamonte Sakaifor pulmonary hypertension.  Diagnosed with hypothyroidism in 2018.  History of stage III chronic kidney disease.  Hyperlipidemia treated with statin.  She is on chronic anticoagulation.  History of partial amputation third finger around 2007 right index finger 2018.  Has had 2 COVID-19 vaccines in February and March of this year.  Generally gets influenza vaccine in the Fall.  She and her husband have been Covid free and doing well.  Granddaughter [place]   Continues to see Dr. RDellia Nimsfor wound care including wound right calcaneus and left lateral malleolus in addition to left venous leg ulcer and leg ulcer right medial lower leg Review of Systems     Objective:   Physical Exam Blood pressure 140/60 pulse 74 pulse  oximetry 94% weight 122 pounds BMI 18.55  Neck is supple without JVD thyromegaly or carotid bruits.  Chest clear to auscultation.  Cardiac exam regular rate and rhythm.  No pitting of the lower extremities.  Her magnesium level is normal at 1.8 with supple heart Her iron level is normal at 95.  She remains anemic at 10 g of hemoglobin with MCV of 100.  B12 and folate were normal in January.  TSH was normal in November and is still normal at 3.27.  Creatinine is 1.44 consistent with chronic kidney disease stage III.  Glucose and liver functions are normal.    Assessment & Plan:  Scleroderma followed by rheumatologist-Dr. 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 RDellia Nimsinvolving multiple areas  Pulmonary hypertension-seen by Dr. BLamonte Sakai Chronic kidney disease stage III a  Essential hypertension  History of dry gangrene right hand  Hearing loss  History of depression  Peripheral vascular disease  Hypothyroidism stable on thyroid replacement  Osteoporosis treated with Prolia  Chronic steroid therapy with Deltasone 5 mg daily  Plan: Labs reviewed.  I am pleased that her magnesium is now normal.  Labs are stable.  She will continue with current regimen.  She will continue with social distancing due to the COVID-19 pandemic.  She will continue with current medications and we will see her again in 6 months which will be time for her annual wellness visit and health maintenance exam with fasting labs.

## 2020-08-24 ENCOUNTER — Encounter (HOSPITAL_COMMUNITY): Payer: Self-pay

## 2020-08-24 DIAGNOSIS — D0461 Carcinoma in situ of skin of right upper limb, including shoulder: Secondary | ICD-10-CM | POA: Diagnosis not present

## 2020-08-24 DIAGNOSIS — D485 Neoplasm of uncertain behavior of skin: Secondary | ICD-10-CM | POA: Diagnosis not present

## 2020-08-24 DIAGNOSIS — D0471 Carcinoma in situ of skin of right lower limb, including hip: Secondary | ICD-10-CM | POA: Diagnosis not present

## 2020-08-26 DIAGNOSIS — H90A32 Mixed conductive and sensorineural hearing loss, unilateral, left ear with restricted hearing on the contralateral side: Secondary | ICD-10-CM | POA: Diagnosis not present

## 2020-08-26 DIAGNOSIS — H90A21 Sensorineural hearing loss, unilateral, right ear, with restricted hearing on the contralateral side: Secondary | ICD-10-CM | POA: Diagnosis not present

## 2020-08-29 ENCOUNTER — Ambulatory Visit (INDEPENDENT_AMBULATORY_CARE_PROVIDER_SITE_OTHER): Payer: Medicare Other | Admitting: Emergency Medicine

## 2020-08-29 ENCOUNTER — Other Ambulatory Visit: Payer: Self-pay

## 2020-08-29 ENCOUNTER — Other Ambulatory Visit (HOSPITAL_COMMUNITY): Payer: Self-pay | Admitting: *Deleted

## 2020-08-29 ENCOUNTER — Encounter: Payer: Self-pay | Admitting: Emergency Medicine

## 2020-08-29 DIAGNOSIS — J849 Interstitial pulmonary disease, unspecified: Secondary | ICD-10-CM | POA: Diagnosis not present

## 2020-08-29 DIAGNOSIS — I2729 Other secondary pulmonary hypertension: Secondary | ICD-10-CM

## 2020-08-29 DIAGNOSIS — J9611 Chronic respiratory failure with hypoxia: Secondary | ICD-10-CM

## 2020-08-29 MED ORDER — ROSUVASTATIN CALCIUM 5 MG PO TABS
ORAL_TABLET | ORAL | 3 refills | Status: AC
Start: 2020-08-29 — End: ?

## 2020-08-29 NOTE — Assessment & Plan Note (Signed)
Continue your oxygen at 2-3 L/min at all times.

## 2020-08-29 NOTE — Assessment & Plan Note (Signed)
Continue prednisone 5 mg once daily I agree that you should get the COVID-19 vaccine booster shot given your relative immunosuppressed state. Follow in 6 months.  At that time we will talk about the timing of repeating your CT scan of the chest to follow interstitial changes. Call sooner if you have any breathing difficulty or new issues.

## 2020-08-29 NOTE — Progress Notes (Signed)
Subjective:    [name], female    DOB: 1951/02/19, 69 y.o.   MRN: [id number] HPI  ROV 04/28/20 --69 year old never smoker with a history of scleroderma and Raynaud's phenomenon, associated severe PAH.  She was previously prostacyclin for her PAH but has been able to come off of this.  She has associated hypoxemic respiratory failure and is on oxygen at 2 3 L/min.  Were maintaining her on prednisone 5 mg daily for her autoimmune disease, sildenafil and macitentan for her PAH.  Her last chest x-ray was November 2020. Last CT was 12/2018.  She had a fall in 11/2019, suffered pelvic fx and is still rehabbing from this. Did not have syncope.  Her breathing is stable. She does note some decreased functional capacity since the pelvic fracture.  She has noticed some R mid chest pleuritic pain, not with every inspiration, but intermittenty  Echocardiogram 03/17/20 reviewed by me, shows normal RV size and function. PASP was not estimated.   ROV 08/29/20 --69 year old woman with history of scleroderma, Raynaud's phenomenon and associated severe PAH.  She used to be on prostacyclin but this has been stopped.  She has tolerated.  She has associated hypoxemic respiratory failure on 2-3 L/min.  She is on prednisone 5 mg daily, sildenafil and macitentan for her PAH. She has LE ulcers that are chronic, have been slow to heal, planning to follow up with [facility].   I performed a ventilation/perfusion scan 05/03/2020 to rule out chronic thromboembolic disease, This was very low probability for pulmonary embolism.  She is not currently on chronic anticoagulation.  04/19/17 --  RA = 4 RV = 80/8 PA = 79/17 (43) PCW = 6 Fick cardiac output/index = 4.1/2.4 PVR = 9.0 WU Ao sat = 94% PA sat = 59%, 62%   Underwent RHC on 04/18/12 as below:  RA = 9  RV = 75/11/14  PA = 71/25 (44)  PCW = 4  Fick cardiac output/index = 3.0/1.7  Thermo CO/CI = 2.7/1.5  PVR = 23.5 [place]  FA sat = 79% on room air (checked 3  times). Sat with finger probe on air 94%  PA sat = 41%, 43%  MW 04/18/12 880 feet  No results for input(s): HGB, HCT, WBC, PLT in the last 168 hours.    Review of Systems  Constitutional: Negative for appetite change, fever and unexpected weight change.  HENT: Negative for congestion, dental problem, ear pain, nosebleeds, postnasal drip, rhinorrhea, sinus pressure, sneezing, sore throat and trouble swallowing.   Eyes: Negative for redness and itching.  Respiratory: Positive for shortness of breath. Negative for cough, chest tightness and wheezing.   Cardiovascular: Negative for palpitations and leg swelling.  Gastrointestinal: Negative for nausea and vomiting.  Genitourinary: Negative for dysuria.  Musculoskeletal: Negative for joint swelling.  Skin: Negative for rash.  Neurological: Negative for headaches.  Hematological: Does not bruise/bleed easily.  Psychiatric/Behavioral: Negative for dysphoric mood. The patient is not nervous/anxious.        Objective:   Physical Exam Vitals:   08/29/20 1604 08/29/20 1606  BP: 140/68   Pulse: 76   Temp:  98.3 F (36.8 C)  TempSrc:  Oral  SpO2: 97% 97%  Weight:  122 lb 9.6 oz (55.6 kg)  Height:  5' 7.5" (1.715 m)   Gen: Pleasant, thin, in no distress,  normal affect on pulsed nasal cannula O2  ENT: No lesions,  mouth clear,  oropharynx clear, no postnasal drip  Neck: No JVD, no  stridor  Lungs: No use of accessory muscles, few bibasilar insp crackles  Cardiovascular: RRR, heart sounds normal, no murmur or gallops, trace peripheral edema  Musculoskeletal: No deformities, no cyanosis or clubbing  Neuro: alert, non focal  Skin: significant changes from scleroderma on arms, hands, fingers; several amputations.   Right lower extremity shin and calf are wrapped and dressed      Assessment & Plan:  ILD (interstitial lung disease) (HCC) Continue prednisone 5 mg once daily I agree that you should get the COVID-19 vaccine booster  shot given your relative immunosuppressed state. Follow in 6 months.  At that time we will talk about the timing of repeating your CT scan of the chest to follow interstitial changes. Call sooner if you have any breathing difficulty or new issues.  Chronic respiratory failure ([place]) Continue your oxygen at 2-3 L/min at all times.  Pulmonary hypertension associated with systemic disorder (HCC) V/q scan negative. Deferring any systemic anticoag at this time.   Please continue sildenafil and macitentan as you have been taking them.  Baltazar Apo, MD, PhD 08/29/2020, 4:45 PM [facility] [phone] or if no answer [phone]

## 2020-08-29 NOTE — Assessment & Plan Note (Signed)
V/q scan negative. Deferring any systemic anticoag at this time.   Please continue sildenafil and macitentan as you have been taking them.

## 2020-08-29 NOTE — Patient Instructions (Signed)
Please continue sildenafil and macitentan as you have been taking them. Continue prednisone 5 mg once daily Continue your oxygen at 2-3 L/min at all times. I agree that you should get the COVID-19 vaccine booster shot given your relative immunosuppressed state. Follow in 6 months.  At that time we will talk about the timing of repeating your CT scan of the chest to follow interstitial changes. Call sooner if you have any breathing difficulty or new issues.

## 2020-08-30 ENCOUNTER — Ambulatory Visit (HOSPITAL_COMMUNITY)
Admission: RE | Admit: 2020-08-30 | Discharge: 2020-08-30 | Disposition: A | Discharge status: Home / Self Care | Payer: Medicare Other | Source: Ambulatory Visit | Attending: Vascular Surgery | Admitting: Vascular Surgery

## 2020-08-30 ENCOUNTER — Ambulatory Visit: Payer: Medicare Other | Admitting: Vascular Surgery

## 2020-08-30 DIAGNOSIS — I739 Peripheral vascular disease, unspecified: Secondary | ICD-10-CM | POA: Diagnosis not present

## 2020-08-31 ENCOUNTER — Other Ambulatory Visit: Payer: Self-pay | Admitting: Internal Medicine

## 2020-08-31 DIAGNOSIS — M79672 Pain in left foot: Secondary | ICD-10-CM | POA: Diagnosis not present

## 2020-08-31 DIAGNOSIS — M810 Age-related osteoporosis without current pathological fracture: Secondary | ICD-10-CM | POA: Diagnosis not present

## 2020-08-31 DIAGNOSIS — M340 Progressive systemic sclerosis: Secondary | ICD-10-CM | POA: Diagnosis not present

## 2020-08-31 DIAGNOSIS — I272 Pulmonary hypertension, unspecified: Secondary | ICD-10-CM | POA: Diagnosis not present

## 2020-08-31 DIAGNOSIS — I7301 Raynaud's syndrome with gangrene: Secondary | ICD-10-CM | POA: Diagnosis not present

## 2020-08-31 DIAGNOSIS — G629 Polyneuropathy, unspecified: Secondary | ICD-10-CM | POA: Diagnosis not present

## 2020-08-31 DIAGNOSIS — Z681 Body mass index (BMI) 19 or less, adult: Secondary | ICD-10-CM | POA: Diagnosis not present

## 2020-08-31 DIAGNOSIS — D638 Anemia in other chronic diseases classified elsewhere: Secondary | ICD-10-CM | POA: Diagnosis not present

## 2020-08-31 DIAGNOSIS — S81801D Unspecified open wound, right lower leg, subsequent encounter: Secondary | ICD-10-CM | POA: Diagnosis not present

## 2020-08-31 DIAGNOSIS — K219 Gastro-esophageal reflux disease without esophagitis: Secondary | ICD-10-CM | POA: Diagnosis not present

## 2020-09-06 ENCOUNTER — Encounter (HOSPITAL_BASED_OUTPATIENT_CLINIC_OR_DEPARTMENT_OTHER): Payer: Medicare Other | Admitting: Internal Medicine

## 2020-09-12 ENCOUNTER — Encounter (HOSPITAL_BASED_OUTPATIENT_CLINIC_OR_DEPARTMENT_OTHER): Payer: Medicare Other | Attending: Internal Medicine | Admitting: Internal Medicine

## 2020-09-12 ENCOUNTER — Other Ambulatory Visit: Payer: Self-pay

## 2020-09-12 DIAGNOSIS — Z7902 Long term (current) use of antithrombotics/antiplatelets: Secondary | ICD-10-CM | POA: Diagnosis not present

## 2020-09-12 DIAGNOSIS — Z9981 Dependence on supplemental oxygen: Secondary | ICD-10-CM | POA: Diagnosis not present

## 2020-09-12 DIAGNOSIS — L97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: Secondary | ICD-10-CM | POA: Diagnosis not present

## 2020-09-12 DIAGNOSIS — L97322 Non-pressure chronic ulcer of left ankle with fat layer exposed: Secondary | ICD-10-CM | POA: Diagnosis not present

## 2020-09-12 DIAGNOSIS — I110 Hypertensive heart disease with heart failure: Secondary | ICD-10-CM | POA: Diagnosis not present

## 2020-09-12 DIAGNOSIS — I2721 Secondary pulmonary arterial hypertension: Secondary | ICD-10-CM | POA: Insufficient documentation

## 2020-09-12 DIAGNOSIS — L97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: Secondary | ICD-10-CM | POA: Diagnosis not present

## 2020-09-12 DIAGNOSIS — L89616 Pressure-induced deep tissue damage of right heel: Secondary | ICD-10-CM | POA: Diagnosis not present

## 2020-09-12 DIAGNOSIS — J9610 Chronic respiratory failure, unspecified whether with hypoxia or hypercapnia: Secondary | ICD-10-CM | POA: Diagnosis not present

## 2020-09-12 DIAGNOSIS — L97312 Non-pressure chronic ulcer of right ankle with fat layer exposed: Secondary | ICD-10-CM | POA: Diagnosis not present

## 2020-09-12 DIAGNOSIS — L97811 Non-pressure chronic ulcer of other part of right lower leg limited to breakdown of skin: Secondary | ICD-10-CM | POA: Insufficient documentation

## 2020-09-12 DIAGNOSIS — I771 Stricture of artery: Secondary | ICD-10-CM | POA: Diagnosis not present

## 2020-09-12 DIAGNOSIS — I87331 Chronic venous hypertension (idiopathic) with ulcer and inflammation of right lower extremity: Secondary | ICD-10-CM | POA: Diagnosis not present

## 2020-09-12 DIAGNOSIS — D649 Anemia, unspecified: Secondary | ICD-10-CM | POA: Insufficient documentation

## 2020-09-12 DIAGNOSIS — I509 Heart failure, unspecified: Secondary | ICD-10-CM | POA: Insufficient documentation

## 2020-09-12 DIAGNOSIS — M069 Rheumatoid arthritis, unspecified: Secondary | ICD-10-CM | POA: Insufficient documentation

## 2020-09-12 DIAGNOSIS — I890 Lymphedema, not elsewhere classified: Secondary | ICD-10-CM | POA: Insufficient documentation

## 2020-09-12 DIAGNOSIS — I83222 Varicose veins of left lower extremity with both ulcer of calf and inflammation: Secondary | ICD-10-CM | POA: Diagnosis not present

## 2020-09-14 DIAGNOSIS — Z23 Encounter for immunization: Secondary | ICD-10-CM | POA: Diagnosis not present

## 2020-09-14 NOTE — Progress Notes (Signed)
[name] [name], [name] [name] ([id number]) Visit Report for 09/12/2020 HPI Details Patient Name: Date of Service: [name] 09/12/2020 12:30 PM Medical Record Number: [id number] Patient Account Number: [id number] Date of Birth/Sex: Treating RN: 21-Apr-1951 (69 y.o. [name] Primary Care Provider: [name], [place] RY Other Clinician: Referring Provider: Treating Provider/Extender: [name], MA RY Weeks in Treatment: 563 History of Present Illness HPI Description: this is a patient who initially came to [place] for wounds on the medial malleoli bilaterally as well as her upper medial lower extremities bilaterally. These wounds eventually healed with assistance of Apligraf's bilaterally. While this was occurring she developed the current wound which opened into a fairly substantial wound on the right lower extremity. These are mostly secondary to venous stasis physiology however the patient also has underlying scleroderma, pulmonary hypertension. The wound has been making good progress lately with the Hydrofera Blue-based dressing. 03/17/2015; patient had a arterial evaluation a year ago. Her right ABI was 0.86 left was 1.0. T brachial index was 0.41 on the right 0.45 on the left. Her oe bilateral common femoral artery waveforms were triphasic. Her white popliteal posterior tibial artery and anterior tibial artery waveforms were monophasic with good amplitude. Luteal artery waveforms were biphasic it was felt that her bilateral great toe pressures are of normal although adequate for tissue healing. 03/24/2015. The condition of this wound is not really improved that. He was covered with as fibrinous surface slough and eschar. This underwent an aggressive debridement with both a curette and scalpel. I still cannot really get down to what I can would consider to be a viable surface. There is no evidence of infection. Previous workup for ischemia roughly a year ago was negative nevertheless I  think that continues to be a concern 04/07/15. The patient arrived for application of second Apligraf. Once again the surface of this wound is certainly less viable than I would like for an advanced treatment option. An aggressive debridement was done. She developed some arteriolar bleeding which required that along pressure and silver alginate. 04/21/15: change in the condition of this wound. Once again it is covered in a gelatinous surface slough. After debridement today surface of the wound looks somewhat better but now a heeling surface 05/05/15 Apligraf #4 applied.Still a lot of slough on this wound. 05/19/15 Apligraf #5 applied. Still a lot of slough on this wound I did not aggressively remove this 06/02/15; continued copious amounts of surface slough. This debridement fairly easily. 06/08/2015 -- the last time she had a venous duplex study done was over 3 years ago and the surgery was prior to that. I have recommended that she sees Dr. early for a another opinion regarding a repeat venous duplex and possibly more endovenous ablation of vein stripping of micro-phlebotomies. 06/16/15; wound has a gelatinous surface eschar that the debridement fairly easily to a point. I don't disagree with the venous workup and perhaps even arterial re-evaluation. She is on prednisone 5 mg and continue his medications for her pulmonary artery hypertension I am not sure if the latter have any wound care healing issues I would need to investigate this. 06/23/15 continues with a gelatinous surface eschar with of fibrinous underlying. What I can see of this wound does not look unhealthy however I just can't get this material which I think is 2 different layers off. Empiric culture done 06/30/15; unfortunately not a lot of change in this wound. A gelatinous surface eschar is easily removed however it  has a tight fibrinous surface underneath the. 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 The wound comes back  and basically and unchanged state the. She has a gelatinous surface that is more easily removed however there is a tightly fibrinous surface underneath the. There is no evidence of infection. She has a vascular follow-up next month. I would have to inject her in order to do a more aggressive debridement of this area 07/21/15 the wound is roughly in the same state albeit the debridement is done with greater ease. There is less of the fibrinous eschar underneath the. There is no evidence of infection. She has follow-up with vascular surgery next week. No evidence of surrounding infection. Her original distal wounds healed while this one formed. 07/28/15; wound is easier to debride. No wound erythema. She is seeing Dr. [name] next week. 08/18/15 Has been to [facility] for repeat ablation. Have been using medihoney pad with some improvement 08/25/15; absolutely no change in the condition of this wound in either its overall size or surface condition in many months now. At one point I had this down to [place] healthier surface I think with [facility] however this did not actually progress towards closure. Do not believe that the wound has actually deteriorated in terms of volume at all. We have been using a medihoney pad which allows easier debridement of the gelatinous eschar but again no overall actual improvement. the patient is going towards an ablation with pain and vascular which I think is scheduled for next week. The only other investigation that I could first see would be a biopsy. She does have underlying scleroderma 09/02/15 eschar is much easier to debridement however the base of this does not look particularly vibrant. We changed to Iodoflex. The patient had her ablation earlier this week 09/09/15 again the debridement over the base of this wound is easier and the base of the wound looks considerably better. We will continue the Iodoflex. Dr. Tawni Millers has expressed his satisfaction with the result of  her ablation 09/15/15 once again the wound is relatively free of surface eschar. No debridement was done today. It has a pale-looking base to it. although this is not as deep as it once was it seems to be expanding especially inferiorly. She has had recent venous ablation but this is no closer to healing.I've gone ahead and done a punch biopsy this from the inferior part of the wound close to normal skin 09/22/15: the wound is relatively free of surface eschar. There is some surrounding eschar. I'm not exactly sure at what level the surface is that I am seeing. 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 No evidence of atypical infection, malignancy. 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 No debridement was done 09/29/15; the wound surface is relatively free of surface eschar. There is eschar surrounding the walls of the wound. Aside from the improvement in the amount of surface slough. This wound has not progressed any towards closure. There is not even a surface that looks like there at this is ready. There is no evidence of any infection nor maligancy based on biopsy I did on 9/15. I continued with the Iodoflex however I am looking towards some alternative to try to promote some closure or filling in of this surface. Consider triple layer Oasis. Collagen did not result in adequate control of the surface slough 10/13/15; the patient was in hospital last week with severe anemia. The  wound looks somewhat better after debridement although there is widening medially. There is no evidence of infection. 10/20/15; patient's wound on the right lateral lower leg is essentially unchanged. This underwent a light surface debridement and in general the debridement is easier and the surface looks improved. I noted in doing this on the side of the wound what appears to be a piece of calcium deposition. The patient noted that she had previous things on the tips  of her fingers. 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 She follows with rheumatology and I have asked them to talk to her about this. In view of that S the nonhealing ulcer may have something to do with calcinosis and also unrelieved Raynaud's disease in this area. I should note that her original wounds on the right and left medial malleolus and the inner aspects of both legs just below the knees did however heal over 10/30/15; the patient's wound on the right lateral lower leg is essentially unchanged. I was able to remove some calcified material from the medial wound edge. 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 Otherwise the wound appears essentially unchanged there is less adherent surface eschar. Some of the calcified material was sent to pathology for analysis 11/17/15. The patient's wound on the right lateral leg is essentially unchanged. Wider Medially. 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 There is some surface slough but I did not debridement this. No evidence of surrounding infection. 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 This will make healing difficult 12/15/15; the wound has less depth surface has a fibrinous slough and calcifications in the wound edge no evidence of infection 12/22/15; the wound definitely has less Fibrinous slough on the base. Calcifications around the wound edges are still evident. Although the wound bed looks healthier it is still pale in appearance. Previous biopsy did not show malignancy 01/04/15; surgical debridement of nonviable slough and  subcutaneous tissue the wound cleans up quite nicely but appears to be expanding outward calcifications around the wound edges are still there. 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 She is to see her rheumatologist I'll see if he has any opinions on this. My punch biopsy done in September did not show calcifications although these are clearly evident. 01/19/16 light selective debridement of nonviable surface slough. There is epithelialization medially. This gives me reason for cautious optimism. She has been to see her rheumatologist, there is nothing that can be done for this type of soft tissue calcification associated with scleroderma 02/02/16 no debridement although there is a light surface slough. She has 2 peninsulas of skin 1 inferiorly and one medially. We continue to make a slight and slow but definite progressive here 02/16/16; light surface debridement with more attention to the circumference of the wound bed where the fibrinous eschar is more prevalent. No calcifications detected. She seems to have done nicely with the [facility] with some epithelialization and some improvement in the overall wound volume. 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 Continued  attention to the circumference of the wound where the fibrinous eschar in calcinosis or prevalent. No calcifications were detected. She has continued improvement with Hydrofera Blue. The wound is no longer as deep 03/15/16 surgical debridement done to remove surface escha Especially around the circumference of the wound where there is nonviable subcutaneous tissue. In spite of this there is considerable improvement in the overall dimensions and depth of the wound. Islands of epithelialization are seen especially medially inferiorly and superiorly to a lesser extent.  She is using Hydrofera Blue at home 03/29/16; surgical debridement done to remove surface eschar and nonviable subcutaneous tissue. This cleans up quite nicely mention slightly larger in terms of length and width however depth is less 04/12/16; continued gradual improvement in terms of depth and the condition of the wound base. Debridement is done. Continuing long standing Hydrofera Blue at home with Kerlix Coban wraps 04/26/16; continued gradual improvement in terms of depth and management as well as condition of the wound base. Surgical debridement done she continues with Hydrofera Blue. 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 She initially came to this clinic venous insufficiency ulcers which have long since healed 05/17/16 continued improvement in terms of the depth and measurements of this wound although she has a tightly adherent fibrinous slough each time. We've been continued with long standing Hydrofera Blue which seems to done as well for this wound is many advanced treatment options. 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 She also has chronic venous insufficiency. She has an irritation on her lateral right ankle secondary to our wraps 05/10/16; wound appears to be smaller especially on the medial aspect and especially in the width. Wound was debridement surface looks better. She is also been in the hospital apparently with anemia again she tells me she had an endoscopy. Since she got home after 3 days which I believe was sometime last week she has had an irritated painful area on the right lateral ankle surrounding the lateral malleolus 05/31/16; much more adherent surface slough today then recently although I don't think the dimensions of changed that much. A more aggressive debridement is required. The irritated area over her medial malleolus is more pruritic and painful and I don't think represents  cellulitis 06/14/16; no major change in her wound dimensions however there is more tightly adherent surface slough which is disappointing. As well as she appears to have a new small area medially. Furthermore an irritated uncomfortable area on the lateral aspect of her right foot just below the lateral malleolus. 06/21/16; I'm seeing the patient back in follow-up for the new areas under her major wound on the right anterior leg. She has been using Hydrofera Blue to this area probably for several months now and although the dressings seem to be helping for quite a period of time I think things have stagnated lately. She comes in today with a relatively tight adherent surface slough and really no changes in the wound shape or dimension. The 2 small areas she had inferiorly are tiny but still open they seem improved this well. There is no uncontrolled edema and I don't think there is any evidence of cellulitis. 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 Patient has had vascular evaluations both venous and arterial. I have biopsied this area. There is no obvious infection. The worrisome thing  today is that she seems to be developing areas of erythema and epithelial damage on the medial aspect of the right foot. Also to a lesser degree inferior to the actual wound itself. Again I see no obvious changes to suggest cellulitis however as this is the only treatable option I will probably give her antibiotics. 07/13/16 no major change in the lady's large anterior right leg wound. Still covered with a very tightly adherent surface slough which is difficult to debridement. There is less erythema around this, culture last week grew pseudomonas I gave her ciprofloxacin. The area on her lateral right malleolus looks better- 08/02/16 the patient's wounds continued to decline. Her original large anterior right leg  wound looks deeper. Still adherent surface slough that is difficult to debridement. She has a small area just below this and a punched-out wound just below her lateral malleolus. In the meantime she is been in hospital with apparently an upper GI bleed on Plavix and aspirin. She is now just on Plavix she received 3 units of packed cells 08/23/16; since I last saw this 3 weeks ago, the open large area on her right leg looks about the same syrup. She has a small satellite lesion just underneath this. The area on her medial right ankle is now a deep necrotic wound. I attempted to debridement this however there is just too much pain. 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 She follows with vascular surgery and is scheduled for an angiogram in early September 09/06/16; the patient is going for an arteriogram tomorrow. Her original large wound on the right calf is about the same the satellite lesion underneath it is about the same however the area on her medial ankle is now deeper with exposed tendon. I am no longer attempting to debride these wounds 09/20/16; the patient has undergone a right femoral endarterectomy and Dacron patch angioplasty. This seems to have helped the flow in her right leg. 10/04/16; Arrived today for aggressive debridement of the wounds on her right calf the original wound the one beneath it and a difficult area over her right lateral leg just above the lateral malleolus. 10/25/16; her 3 open wounds are about the same in terms of dimensions however the surface appears a lot healthier post debridement. Using Iodoflex 10/18/16 we have been using Iodoflex to her wounds which she tolerated with some difficulty. 10/11/16; has been using Santyl for a period of time with some improvement although again very adherent surface slough would prevent any attempted healing this. She has a original wound on the left calf, the  satellite underneath that and the most recent wound on the right medial ankle. She has completed revascularization by Dr. early and has had venous ablation earlier. Want to go back to Iodoflex to see if week and get a healthier surface to this wound bed failing this I think she'll need to be taken to the OR and I am prepared to call Dr. Marla Roe to discuss this. She is obviously not a good candidate for general anesthesia however.; 11/08/16; I put her on Iodoflex last time to see if I can get the wound bed any healthier and unfortunately today still had tremendous surface slough. 11/15/16; 4 weeks' worth of Iodoflex with not much improvement. Debridement on the major wounds on her left anterior leg is easier however this does not maintain from week to week. The punched-out area on her medial right ankle  11/29/16; I attempted to change the patient last visit to [facility] however she states this burned and was very uncomfortable therefore we gave her permission to go back to the eye out of complex which she already had at home. Also she noted a lot of pain and swelling on the lateral aspect of her leg before she traveled to [facility] for the holidays, I called her in doxycycline over the phone. This seems to have helped 12/06/16; Wounds unchanged by in large. Using Iodoflex 12/13/16; her wounds today actually looks somewhat better. The area on the right lateral lower leg has reasonably healthy-looking granulation and perhaps as actually filled and a bit. Debridement of the 2 wounds on the medial calf is easier and post debridement appears to have a healthier base. We have referred her to Dr. Migdalia Dk for consideration of operative debridement 12/20/16; we have a quick note from Dr. Merri Ray who feels that the patient needs to be referred to an academic center/plastic surgeon. This is due to the complexity of the patient's medical issues as it applies to anything in the OR. We  have been using Iodoflex 12/27/16; in general the wounds on the right leg are better in terms of the difficult to remove surface slough. She has been using Iodoflex. She is approved for Apligraf which I anticipated ordering in the next week or 2 when we get a better-looking surface 01/04/16 the deep wounds on the right leg generally look better. Both of them are debrided further surface slough. The area on the lateral right leg was not debrided. She is approved for Apligraf I think I'll probably order this either next week or the week after depending on the surface of the wounds superiorly. We have been using Iodoflex which will continue until then 01/11/16; the deep wounds on the right leg again have a surface slough that requires debridement. I've not been able to get the wound bed on either one of these wounds down to what would be acceptable for an advanced skin stab-like Apligraf. The area on the medial leg has been improving. We have been using hot Iodoflex to all wounds which seems to do the best at at least limiting the nonviable surface 01/24/17; we have continued Iodoflex and all her wound areas. Her debridement Gen. he is easier and the surface underneath this looks viable. Nevertheless these are large area wounds with exposed muscle at least on the anterior parts. We have ordered Apligraf's for 2 weeks from now. The patient will be away next week 02/07/17; the patient was close to have first Apligraf today however we did not order one. I therefore replaced her Iodoflex. She essentially has 3 large punched- out areas on her right anterior leg and right medial ankle. 02/11/17; Apligraf #1 02/25/17; Apligraf #2. In general some improvement in the right medial ankle and right anterior leg wounds. The larger wound medially perhaps some better 03/11/17; Apligraf #3. In general continued improvement in the right medial ankle and the right anterior leg wounds. 03/25/17 Apligraf #4. In general continued  improvement especially on the right medial ankle and the lower 04/08/17; Apligraf #5 in general continued improvement in all wound sites. 04/22/17; post Apligraf #5 her wound beds continued to look a lot better all of them up to the surface of the surrounding skin. Had a caramel-colored slough that I did not debrided in case there is residual Apligraf effect. The wounds are as good as I have seen these looking quite some time.  04/29/17; we applied [place] and last week after completing her Apligraf. Wounds look as though they've contracted somewhat although they have a nonviable surface which was problematic in the past. Apparently she has been approved for further Apligraf's. We applied Iodosorb today after debridement. 05/06/17; we're fortunate enough today to be able to apply additional Apligraf approved by her insurance. In general all of her wounds look better Apligraf #6 05/24/17; Apligraf #7 continued improvement in all wounds 3 06/10/17 Apligraf #8. Continued improvement in the surface of all wounds. Not much of an improvement in dimensions as I might a follow 06/24/17 Apligraf #9 continued general improvement although not as much change in the wound areas I might of like. She has a new open area on the right anterior lateral ankle very small and superficial. She also has a necrotic wound on the tip of her right index finger probably secondary to severe uncontrolled Raynaud's phenomenon. She is already on sildenafil and already seen her rheumatologist who gave her Keflex. 07/08/17; Apligraf #10. Generalized improvement although she has a small additional wound just medial to the major wound area. 07/22/17; after discussion we decided not to reorder any further Apligraf's although there is been considerable improvement with these it hasn't been recently. The major wound anteriorly looks better. Smaller wounds beneath this and the more recent one and laterally look about the same. The area on the  right lateral lower leg looks about the same 07/29/17 this is a patient who is exceedingly complex. She has advanced scleroderma, crest syndrome including calcinosis, PAD status post revascularization, chronic venous insufficiency status post ablations. She initially presented to this clinic with wounds on her bilateral lower legs however these closed. More recently we have been dealing with a large open area superiorly on the right anterior leg, a smaller wound underneath this and then another one on the right medial lower extremity. These improve significantly with 10 Apligraf applications. Over the last 2-3 weeks we are making good progress with Hydrofera Blue and these seem to be making progress towards closure 08/19/17; wounds continued to make good improvement with Hydrofera Blue and episodic aggressive debridements 08/26/17; still using Hydrofera Blue. Good improvements 09/09/17; using Hydrofera Blue continued improvement. Area on the lateral part of her right leg has only a small remaining open area. The small inferior satellite region is for all intents and purposes closed. Her major wound also is come in in terms of depth and has advancing epithelialization. 09/16/17; using Hydrofera Blue with continued improvement. The smaller satellite wound. We've closed out today along with a new were satellite wound medially. The area on her medial ankle is still open and her major wound is still open but making improvement. All using Hydrofera Blue. Currently 09/30/17; using Hydrofera Blue. Still a small open area on the lateral right ankle area and her original major wound seems to be making gradual and steady improvement. 10/14/17; still using Hydrofera Blue. Still too small open areas on the right lateral ankle. Her original major wound is horizontal and linear. The most problematic area paradoxically seems to be the area to the medial area wears I thought it would be the lateral. 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 10/28/17; still using Hydrofera Blue. Right lateral ankle has a very small open area superiorly on the most lateral part of the wound. Her original open wound has 2 open areas now separated by normal skin and we've redefined this. 11/11/17;  still using Hydrofera Blue area and right lateral ankle continues to have a small open area on mostly the lateral part of the wound. Her original wound has 2 small open areas now separated by a considerable amount of normal skin 11/28/17; the patient called in slightly before Thanksgiving to report pain and erythema above the wound on the right leg. In the past this is responded well to treatment for cellulitis and I gave her over the phone doxycycline. She stated this resulted in fairly abrupt improvement. We have been using Hydrofera Blue for a prolonged period of time to the larger wound anteriorly into the remaining wound on her right right lateral ankle. The latter is just about closed with only a small linear area and the bottom of the [place]. 12/02/17; use endoform and left the dressing on since last visit. There is no tenderness and no evidence of infection. 12/16/17; patient has been using Endoform but not making much progress. The 2 punched out open areas anteriorly which were the reminiscence of her major wound appear deeper. The area on the lateral aspect of her right calf also appears deeper. Also she has a puzzling tender swelling above her wound on the right leg. This seems larger than last time. Just above her wounds there appears to be some fluctuance in this area it is not erythematous and there is no crepitus 12/30/17; patient has been using Endoform up until last week we used Hydrofera Blue. Ultrasound of the swelling above her 2 major wounds last time was negative for a fluid collection. I gave her cefaclor for the erythema and tenderness in this area which seems better.  Unfortunately both punched out areas anteriorly and the area on her right medial lower leg appear deeper. In fact the lateral of the wounds anteriorly actually looks as though it has exposed tendon and/or muscle sheath. She is not systemically unwell. She is complaining of vaginitis type symptoms presumably Candida from her antibiotics. 01/06/18; we're using santyl. she has 2 punched out areas anteriorly which were initially part of a large wound. Unfortunately medially this is now open to tendon/muscle. All the wounds have the same adherent very difficult to debride surface. 01/20/18; 2 week follow-up using Santyl. She has the 2 punched out areas anteriorly which were initially part of her large surface wound there. Medially this still has exposed muscle. All of these have the same tightly adherent necrotic surface which requires debridement. PuraPly was not accepted by the patient's insurance however her insurance I think it changed therefore we are going to run Apligraf to gain 02/03/18; the patient has been using Santyl. The wound on the right lateral ankle looks improved and the 2 areas anteriorly on the right leg looks about the same. The medial one has exposed muscle. The lateral 1 requiresdebridement. We use PuraPly today for the first week 02/10/18; PuraPly #2. The patient has 3 wounds. The area on the right lateral ankle, 2 areas anteriorly that were part of her original large wound in this area the medial one has exposed muscle. All of the wounds were lightly debrided with a number 3 curet. PuraPly #2 applied the lateral wound on the calf and the right lateral ankle look better. 02/17/18; PuraPly #3. Patient has 3 wounds. The area on the right lateral ankle in 2 areas internally that were part of her original large wound. The lateral area has exposed muscle. She arrived with some complaints of pain around the right ankle. 02/24/18; PuraPly #4; not much  change in any of the 3 wound areas. Right  lateral ankle, right lateral calf. Both of these required debridement with a #3 curet. She tolerates this marginally. The area on the medial leg still has exposed muscle. Not much change in dimensions 03/03/18 PuraPly #5. The area on the medial ankle actually looks better however the 2 separate areas that were original parts of the larger right anterior leg wound look as though they're attempting to coalesce. 03/10/18; PuraPly #6. The area on the medial ankle actually continues to look and measures smaller however the 2 separate areas that were part of the original large wound on the right anterior leg have now coalesced. There hasn't been much improvement here. The lateral area actually has underlying exposed muscle 03/17/18-she is here in follow-up evaluation for ulcerations to the right lower extremity. She is voicing no complaints or concerns. She tolerated debridement. Puraply#7 placed 03/24/18; difficult right lower extremity ulcerations. PuraPly #8 place. She is been approved for [employer]. She did very well with Apligraf today however she is apparently reached her "lifetime max" 03/31/18; marginal improvement with PuraPly although her wounds looked as good as they have in several weeks today. Used TheraSkins #1 04/14/18 TheraSkin #2 today 04/28/18 TheraSkins #3. Wound slightly improved 05/12/18; TheraSkin skin #4. Wound response has been variable. 05/27/18 TheraSkin #5. Generally improvement in all wound areas. I've also put her in 3 layer compression to help with the severe venous hypertension 06/09/18; patient is done quite well with the TheraSkins unfortunately we have no further applications. I also put her in 3 layer compression last week and that really seems to of helped. 06/16/18; we have been using silver collagen. Wounds are smaller. Still be open area to the muscle layer of her calf however even that is contracted somewhat. She tells me that at night sometimes she has pain on the right  lateral calf at the site of her lower wound. Notable that I put her into 3 layer compression about 3 weeks ago. She states that she dangles her leg over the bed that makes it feel better but she does not describe claudication during the day She is going to call her secondary insurance to see if they will continue to cover advanced treatment products I have reviewed her arterial studies from 01/22/17; this showed an ABI in the right of 1 and on the left noncompressible. TBI on the right at 0.30 on the left at 0.34. It is therefore possible she has significant PAD with medial calcification falsely elevating her ABI into the normal range. I'll need to be careful about asking her about this next week it's possible the 3 layer compression is too much 06/23/18; was able to reapply TheraSkin 1 today. Edema control is good and she is not complaining of pain no claudication 07/07/18;no major change. New wound which was apparently a taper removal injury today in our clinic between her 2 wounds on the right calf TheraSkins #2 07/14/18; I think there is some improvement in the right lateral ankle and the medial part of her wound. There is still exposed muscle medially. 07/28/18; two-week follow-up. TheraSkins#3. Unfortunately no major change. 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 She seems to be doing well and in the base of the wound appears to show some progress at this point. This is  the last approved Theraskin of the second set. 08/25/18; she has completed TheraSkin. There has been some improvement on the right lateral calf wound as well as the anterior leg wounds. The open area to muscle medially on the anterior leg wound is smaller. I'm going to transition her back to [facility] under Kerlix Coban change every  second day. She reports that she had some calcification removed from her right upper arm. We have had previous problems with calcifications in her wounds on her legs but that has not happened recently 09/08/18;using Hydrofera Blue on both her wound areas. Wounds seem to of contracted nicely. She uses Kerlix Coban wrap and changes at home herself 09/22/2018; using Hydrofera Blue on both her wound areas. Dimensions seem to have come down somewhat. There is certainly less depth in the medial part of the mid tibia wound and I do not think there is any exposed muscle at this point. 10/06/2018; 2-week follow-up. Using [facility] on her wound areas. Dimensions have come down nicely both on the right lateral ankle area in the right mid tibial area. She has no new complaints 10/20/2018; 2-week follow-up. She is using Hydrofera Blue. Not much change from the last time she was here. The area on the lateral ankle has less depth although it has raised edges on one side. I attempted to remove as much of the raised edge as I could without creating more additional wounds. The area on the right anterior mid tibia area looks the same. 11/03/2018; 2-week follow-up she is using Hydrofera Blue. On the right anterior leg she now has 2 wounds separated by a large area of normal skin. The area on the medial part still has I think exposed muscle although this area itself is a lot smaller. The area laterally has some depth. Both areas with necrotic debris. The area on her right lateral ankle has come in nicely 11/17/2018; patient continues to use Hydrofera Blue. We have been increasing separation of the 2 wounds anteriorly which were at one point joint the area on the right lateral calf continues to have I think some improvement in depth. 12/08/2018; patient continues to use Hydrofera Blue. There is some improvement in the area on the right lateral calf. The 2 areas that were initially part of the original large wound in the  mid right tibia are probably about the same. In fact the medial area is probably somewhat larger. We will run puraply through the patient's insurance 12/22/2018; she has been using Hydrofera Blue. We have small wounds on the right lateral calf and 2 small areas that were initially part of a large wound in the right mid tibia. We applied pure apply #1 today. 12/29/2018; we applied puraply #2. Her wounds look somewhat better especially on the right lateral calf and the lateral part of her original wound in the mid tibial area. 01/05/2019; perhaps slightly improved in terms of wound bed condition but certainly not as much improvement as I might of liked. Puraply #3 1/13: we did not have a correct sized puraply to apply. wounds more pinched out looking, I increased her compression to 3 layer last week to help with significant multilevel venous hypertension. Since then I've reviewed her arterial status. She has a right femoral endarterectomy and a distal left SFA stent. She was being followed by Dr. [name] for a period however she does not appear to have seen him in 3 years. I will set up an appointment. 1/20. The patient has an additional  wound on the right lateral calf between the distal wound and proximal wounds. We did not have Puraply last week. Still does not have a follow-up with Dr. early 1/27: Follow-up with Dr. early has been arranged apparently with follow-up noninvasive studies. Wounds are measuring roughly the same although they certainly look smaller 2/3; the patient had non invasive studies. Her ABIs on the right were 0.83 and on the left 1.02 however there was no great toe pressure bilaterally. Also worrisome monophasic waveforms at the PTA and dorsalis pedis. We are still using Puraply. We have had some improvement in all of the wounds especially the lateral part of the mid tibial area. 2/10; sees Dr. early of vein and vascular re-arterial studies next week. Puraply reapplied today. 2/17;  Dr. early of vein and vascular his appointment is tomorrow. Puraply reapplied after debridement of all wounds 2/24; the patient saw Dr. early I reviewed his note. sHe noted the previous right femoral endarterectomy with a Dacron patch. He also noted the normal ABI and the monophasic waveforms suggesting tibial disease. Overall he did not feel that she had any evidence of arterial insufficiency that would impair her wound healing. He did note her venous disease as well. He suggested PRN follow-up. 3/2; I had the last puraply applied today. The original wounds over the mid tibia area are improved where is the area on the right lower leg is not 3/9; wounds are smaller especially in the right mid tibia perhaps slightly in the right lateral calf. We finished with puraply and went to endoform today 3/23; the patient arrives after 2 weeks. She has been using endoform. I think all of her wounds look slightly better which includes the area on the right lateral calf just above the right lateral malleolus and the 2 in the right mid tibia which were initially part of the same wound. 4/27; TELEHEALTH visit; the patient was seen for telehealth visit today with her consent in the middle of the worldwide epidemic. Since she was last here she called in for antibiotics with pain and tenderness around the area on the right medial ankle. I gave her empiric doxycycline. She states this feels better. She is using endoform on both of these areas 5/11 TELEHEALTH; the patient was seen for telehealth visit today. She was accompanied at home by her husband. She has severe pulmonary hypertension accompanied scleroderma and in the face of the Covid epidemic cannot be safely brought into our clinic. We have been using endoform on her wound areas. There are essentially 3 wound areas now in the left mid tibia now 2 open areas that it one point were connected and one on the right lateral ankle just above the malleolus. 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 She cannot come to clinic. The wounds were reviewed today via telehealth. She has severe chronic venous hypertension which I think is centrally mediated. She has wounds on her right anterior tibia and right lateral ankle area. These are chronic. She has been using endoform. 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 We have been following her from telehealth. She has severe chronic venous hypertension which may be mostly centrally mediated secondary to right heart heart failure. She  has wounds on her right anterior tibia and right lateral ankle these are chronic we have been using endoform 6/22; TELEHEALTH; this patient was seen today via telehealth. 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 Since the last time we had contact with this patient she developed some pain and erythema around the wound on her right lateral malleolus/ankle and we put in antibiotics for her. This is resulted in good improvement with resolution of the erythema and tenderness. I changed her to silver alginate last time. We had been using endoform for an extended period of time 7/13; TELEHEALTH; this patient was seen today via telehealth. 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 She would continue to be at a prohibitive risk to be brought into our clinic unless this was absolutely necessary. These visits have been done with her approval as well as her husband. We have been using silver alginate to the areas on the right mid tibia and right lateral lower leg. 7/27  TELEHEALTH; patient was seen along with her husband today via telehealth. 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 We changed her to sample last visit. She has 2 areas a chronic wound on her right mid tibia and one just above her ankle. These were not the original wounds when she came into this clinic but she developed them during treatment 8/17; she comes in for her first face-to-face visit in a long period. She has a remaining area just medial to the right tibia which is the last open part of her large wound across this area. She also has an area on the right lateral lower leg. We prescribed Santyl last telehealth visit but they were concerned that this was making a deeper so they put silver alginate on it last week. Her husband changes the dressings. 8/31; using Santyl to the 2 wound areas some improvement in wound surfaces. Husband has surgery in 2 weeks we will put her out 3 weeks. Any of the advanced treatment options that I can think of that would be eligible for this wound would also cause her to have to come in weekly. The risk that the patient is just too high 9/21. Using Santyl to the 2 wound areas. Both of these are somewhat better although the medial mid tibia area still has exposed muscle. Lateral ankle requiring debridement. Using Santyl 10/12; using Santyl to the 2 wound areas. One on the right lateral ankle and the other in the medial calf which still has exposed muscle. Both areas have come down in size and have better looking surfaces. She has made nice progress with santyl 11/2; we have been using Santyl to the 2 wound areas. Right lateral ankle and the other in the mid tibia area the medial part of this still has exposed muscle. 11/23/2019 on evaluation today patient appears to be doing about the same really with regard to her wounds. She is actually not very pleased with how things seem to  be progressing at this point she tells me that she really has not noted much improvement unfortunately. With that being said there is no signs of active infection at this time. There is some slough buildup noted at this time which again along with some dry skin around the edges of the wound I think would benefit her to try  to debride some of this away. Fortunately her pain is doing fairly well. She still has exposed muscle in the right medial/tibial area. 12/14; TELEHEALTH; she was changed to [facility] to the right calf wound and right lateral ankle wound when she was here last time. Unfortunately since then she had a fall with a pelvic fracture and a fracture of her wrist. She was apparently hospitalized for 5 days. I have not looked at her discharge summary. She apparently came out of the hospital with a blister on her right heel. She was seen today via telehealth by myself and our case manager. The patient and her husband were present. She has been using Hydrofera Blue at our direction from the last time she was in the clinic. There is been no major improvement in fact the areas appear deeper and with a less viable surface 01/04/2020; TELEHEALTH; the patient was seen today in accompaniment of her husband and our nurse. She has 3 open areas 1 on the right medial mid tibia, one on the right lateral ankle and a large eschared pressure area on the right heel. We have been using Iodoflex to the 2 original wounds. 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 It is simply too perilous for her to be seen in any other way 1/26; TELEHEALTH; the patient was seen today in accompaniment of her husband and our RN one of our nurses. She still has the 3 open areas 1 on the right medial mid tibia which is the remanent of a more extensive wound in this area, one on the right lateral ankle and a large eschared pressure area on the right heel. We have been using Iodoflex to the 2  original wounds and a bed at 9 application to the eschared area on the heel 2/15; the first time we have seen this patient and then several months out of concern for the pandemic. She had a large horizontal wound in the mid tibia. Only the medial aspect of this is still open. Area on the lateral ankle is just about closed. She had a new pressure ulcer on the right heel which I have removed some of the eschar. We have been using Iodoflex which I will continue. The area in the mid tibia has a round circle in the middle of exposed muscle. I think we would have to use an advanced treatment product to stimulate granulation over this area. We will run this through her insurance. She is not eligible for plastic surgery for many reasons 3/1; TELEHEALTH. The patient was seen today by telehealth. 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 We have been using Iodoflex to the wound areas which include the right anterior mid tibia, right lateral ankle and the right heel. All of these were reviewed 3/8; the patient's wound just above the right ankle is closed. She still has a contracting black eschar on the heel where she had a pressure ulcer. The medial part of her original wound on the mid tibia has exposed muscle. We have made really made no progress in this area although we have managed to get a lot of the original wound in this area to close. We used Apligraf #1 today 3/22; the patient's ankle wound remains closed. She still has a contracting black eschar on the heel although it seems to have less surface area. It still not clear whether there is any depth here.  We used Apligraf #2 today in an attempt to get granulation over the exposed muscle and what is left of her mid tibia area. 4/5; everything is closed except the medial aspect of the mid tibia wound, as well as the pressure injury on the right heel. She has been  using Betadine to the right heel which has been gradually contracting. We used Apligraf #3 today. 4/19; Apligraf #4. Still a pressure area on the right heel she has been using Betadine superficial excoriated areas she has been applying salicylic acid to on the anterior leg and the thick horn on the leg just above her wound area 5/3; Apligraf #5. Unfortunately she came in today with a reopening of the area on the right lateral lower leg. Not much change in the wound we have been treating in the mid calf. Quite a bit of edema surrounding this wound. She reports that she was up on her feet quite a bit. The area on the right heel is separating she has been using Betadine She comes in with a new wound on the left lateral malleolus which is very disappointing this is been open since last week 5/17; we applied her last Apligraf last time. Unfortunately that did not have any effect on the deep area medially in the mid tibial area. However the area over the right lateral malleolus is a lot better. Right heel also is contracted we used Iodoflex last time. The new wound on the left lateral malleolus were using Santyl to. This required debridement. 6/1. Not much change in the area on the right medial mid tibia. Right lateral ankle is improved she has the necrotic area on the right heel which was a pressure area. New area on the left lateral malleolus from last time. I changed her to Iodoflex to the area on the left lateral malleolus she said this hurt I have put her back on Santyl 6/15; right mid tibia is unchanged. I do not think there is anything I can do to this topically that we will get this to granulate over the muscle. And I am furthermore I am not sure that she is a surgical candidate either because of her severe pulmonary issues or because she really does not want to go through it. The area on the right lateral malleolus is a small punched out area. The area on the left lateral malleolus again small  punched out with nonviable surface Pressure ulcer on the right heel at separating black eschar. I went ahead and remove this today she has an area on the left anterior mid tibia just laterally. Geographic wounds debris on the surface. Some erythema here. 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 There is skin changes widely Change the primary dressing to Sorbact to all wound areas. She needs compression on the left leg as well as the right 6/21; right mid tibia which was her one remaining wound at 1 point is unchanged The area on the right lateral malleolus actually is close to closing over still a small punched out area The area on the left lateral malleolus again a deep wound it is this 1 that she feels pain in. Pressure ulcer on the right heel was cultured today. New area on the left anterior mid tibia several geographic wounds last week in the setting of chronic venous insufficiency this actually looks some better 7/6; Right mid tibia about the same. Exposed muscle Left lateral malleolus again necrotic debris over the surface. Pressure ulcer on the  right heel. She finished the Keflex we prescribed. Necrotic debris debrided with a #3 curette The rest of her wounds on the right mid tibia right lateral ankle are better Her husband reminds me that she had a right femoral endarterectomy and has 2 stents in her left thigh placed in 2011 approximately by vascular surgery in [place]. She saw Dr. [name] last in February 2020. At that point he did not think that the arterial insufficiency she had on the right was sufficient to explain any of her right lower extremity wounds however she now has an area on the left lateral malleolus that looks like an ischemic wound 7/12; we have been using Sorbact all wound areas Right mid tibia about the same exposed muscle Left lateral malleolus small wound with debris in the surface punched out Pressure ulcer of the right heel requiring  debridement of necrotic debris Lateral ankle wound on the right is just about closed Right mid tibia wound about the same still with exposed muscle. 7/26 really no change in any wounds. We have been using Sorbact. She has bilateral lower extremity wounds. This is in the setting of scleroderma, severe venous hypertension. She also has known PAD and I had really hoped to be able to get a review by Dr. [name] but apparently that is not booked until August 31. I have asked her to call back to vein and vascular and get an appointment with somebody a little earlier if that is possible. 8/16; patient's area on the right ankle which was a chronic wound is closed over again. The area on the right mid lower leg, right heel left lateral ankle are all about the same. Pressure ulcer on the right heel is still not viable. New areas identified on the right lateral calf. She has a follow-up with Dr. [name] on 8/31. Phenomenally I would like him to go over her arterial status with regards to her underlying stents. 9/13; patient had her ARTERIAL studies done however apparently Dr. [name] is now in [place] so she did not get an appointment with him on 8/31 and was not referred to any other provider. On the right side her PTA was monophasic. Noncompressible. TBI on the right at 0.41 on the left she was monophasic and noncompressible they did not do a TBI because she had a Band-Aid on her big toe. Patient does not describe claudication although she is having a lot of pain in the left lateral malleolus. Electronic Signature(s) Signed: 09/13/2020 5:12:45 PM By: Linton Ham MD Entered By: Linton Ham on 09/12/2020 14:13:26 -------------------------------------------------------------------------------- Physical Exam Details Patient Name: Date of Service: [name] [name], [name] 09/12/2020 12:30 PM Medical Record Number: [id number] Patient Account Number: [id number] Date of Birth/Sex: Treating RN: Jul 10, 1951 (69 y.o.  [name] Primary Care Provider: [name], [place] RY Other Clinician: Referring Provider: Treating Provider/Extender: [name], MA RY Weeks in Treatment: 762 Constitutional Patient is hypertensive.. Pulse regular and within target range for patient.Marland Kitchen Respirations regular, non-labored and within target range.. Temperature is normal and within the target range for the patient.Marland Kitchen Appears in no distress. Notes Wound exam Midportion of the right tibia still with exposed muscle not much change. Small area on the right lateral malleolus. Pressure area on the plantar right heel Left lateral malleolus nonviable surface. She has the skin surgery sites on the left anterior tibia x2 we have not been specifically addressing this. Electronic Signature(s) Signed: 09/13/2020 5:12:45 PM By: Linton Ham MD Entered By: Dellia Nims,  Clevie Prout on 09/12/2020 14:14:49 -------------------------------------------------------------------------------- Physician Orders Details Patient Name: Date of Service: [name] 09/12/2020 12:30 PM Medical Record Number: [id number] Patient Account Number: [id number] Date of Birth/Sex: Treating RN: 1951/11/07 (69 y.o. 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 Dressing Change Frequency Change Dressing every other day. - all wounds Skin Barriers/Peri-Wound Care Moisturizing lotion - both legs Wound Cleansing May shower with protection. Primary Wound Dressing Wound #16 Right Calcaneus Cutimed Sorbact Wound #19 Right,Lateral Malleolus Cutimed Sorbact Wound #20 Left,Lateral Malleolus Santyl Ointment Wound #23 Right,Posterior Lower Leg Cutimed Sorbact Wound #5 Right,Medial Lower Leg Cutimed Sorbact Secondary Dressing Wound #16 Right Calcaneus Dry Gauze Wound #19 Right,Lateral Malleolus Dry Gauze Wound #20 Left,Lateral Malleolus Dry Gauze Wound #5 Right,Medial Lower Leg Dry Gauze Edema Control Kerlix and Coban - Bilateral Avoid standing for long periods of time Elevate legs to the level of the heart or above for 30 minutes daily and/or when sitting, a frequency of: - throughout the day Off-Loading Turn and reposition every 2 hours Other: - float heels off of bed/chair with pillow under calves Electronic Signature(s) Signed: 09/13/2020 5:12:45 PM By: Linton Ham MD Signed: 09/14/2020 5:57:30 PM By: Levan Hurst RN, BSN Entered By: Levan Hurst on 09/12/2020 14:02:34 -------------------------------------------------------------------------------- Problem List Details Patient Name: Date of Service: [name] [name], [name] 09/12/2020 12:30 PM Medical Record Number: [id number] Patient Account Number: [id number] Date of Birth/Sex: Treating RN: 05/18/1951 (69 y.o. 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 02/24/2015 No Yes I87.331 Chronic venous hypertension (idiopathic) with ulcer and inflammation of right 10/04/2016 No Yes lower extremity L89.616 Pressure-induced deep tissue damage of right heel 12/14/2019 No Yes L97.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 05/31/2020 No Yes Inactive Problems ICD-10 Code Description Active Date Inactive Date 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 09/12/2020 12:30 PM Medical Record Number: [id number] Patient Account Number: [id number] Date of Birth/Sex: Treating RN: [birth year] (69 y.o. [name] Primary  Care Provider: [name], [place] RY Other Clinician: Referring Provider: Treating Provider/Extender: [name], MA RY Weeks in Treatment: 442 Subjective History of Present Illness (HPI) this is a patient who initially came to [place] for wounds on the medial malleoli bilaterally as well as her upper medial lower extremities bilaterally. These wounds eventually healed with assistance of Apligraf's bilaterally. While this was occurring she developed the current wound which opened into a fairly substantial wound on the right lower extremity. These are mostly secondary to venous stasis physiology however  the patient also has underlying scleroderma, pulmonary hypertension. The wound has been making good progress lately with the Hydrofera Blue-based dressing. 03/17/2015; patient had a arterial evaluation a year ago. Her right ABI was 0.86 left was 1.0. T brachial index was 0.41 on the right 0.45 on the left. Her oe bilateral common femoral artery waveforms were triphasic. Her white popliteal posterior tibial artery and anterior tibial artery waveforms were monophasic with good amplitude. Luteal artery waveforms were biphasic it was felt that her bilateral great toe pressures are of normal although adequate for tissue healing. 03/24/2015. The condition of this wound is not really improved that. He was covered with as fibrinous surface slough and eschar. This underwent an aggressive debridement with both a curette and scalpel. I still cannot really get down to what I can would consider to be a viable surface. There is no evidence of infection. Previous workup for ischemia roughly a year ago was negative nevertheless I think that continues to be a concern 04/07/15. The patient arrived for application of second Apligraf. Once again the surface of this wound is certainly less viable than I would like for an advanced treatment option. An aggressive debridement was done. She developed some arteriolar bleeding which required that along pressure and silver alginate. 04/21/15: change in the condition of this wound. Once again it is covered in a gelatinous surface slough. After debridement today surface of the wound looks somewhat better but now a heeling surface 05/05/15 Apligraf #4 applied.Still a lot of slough on this wound. 05/19/15 Apligraf #5 applied. Still a lot of slough on this wound I did not aggressively remove this 06/02/15; continued copious amounts of surface slough. This debridement fairly easily. 06/08/2015 -- the last time she had a venous duplex study done was over 3 years ago and the surgery was prior to  that. I have recommended that she sees Dr. early for a another opinion regarding a repeat venous duplex and possibly more endovenous ablation of vein stripping of micro-phlebotomies. 06/16/15; wound has a gelatinous surface eschar that the debridement fairly easily to a point. I don't disagree with the venous workup and perhaps even arterial re-evaluation. She is on prednisone 5 mg and continue his medications for her pulmonary artery hypertension I am not sure if the latter have any wound care healing issues I would need to investigate this. 06/23/15 continues with a gelatinous surface eschar with of fibrinous underlying. What I can see of this wound does not look unhealthy however I just can't get this material which I think is 2 different layers off. Empiric culture done 06/30/15; unfortunately not a lot of change in this wound. A gelatinous surface eschar is easily removed however it has a tight fibrinous surface underneath the. 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 The wound comes back and basically and unchanged state the. She has a gelatinous surface that is more easily removed however there is a tightly  fibrinous surface underneath the. There is no evidence of infection. She has a vascular follow-up next month. I would have to inject her in order to do a more aggressive debridement of this area 07/21/15 the wound is roughly in the same state albeit the debridement is done with greater ease. There is less of the fibrinous eschar underneath the. There is no evidence of infection. She has follow-up with vascular surgery next week. No evidence of surrounding infection. Her original distal wounds healed while this one formed. 07/28/15; wound is easier to debride. No wound erythema. She is seeing Dr. [name] next week. 08/18/15 Has been to [facility] for repeat ablation. Have been using medihoney pad with some improvement 08/25/15; absolutely no change in the condition of this  wound in either its overall size or surface condition in many months now. At one point I had this down to [place] healthier surface I think with [facility] however this did not actually progress towards closure. Do not believe that the wound has actually deteriorated in terms of volume at all. We have been using a medihoney pad which allows easier debridement of the gelatinous eschar but again no overall actual improvement. the patient is going towards an ablation with pain and vascular which I think is scheduled for next week. The only other investigation that I could first see would be a biopsy. She does have underlying scleroderma 09/02/15 eschar is much easier to debridement however the base of this does not look particularly vibrant. We changed to Iodoflex. The patient had her ablation earlier this week 09/09/15 again the debridement over the base of this wound is easier and the base of the wound looks considerably better. We will continue the Iodoflex. Dr. Tawni Millers has expressed his satisfaction with the result of her ablation 09/15/15 once again the wound is relatively free of surface eschar. No debridement was done today. It has a pale-looking base to it. although this is not as deep as it once was it seems to be expanding especially inferiorly. She has had recent venous ablation but this is no closer to healing.I've gone ahead and done a punch biopsy this from the inferior part of the wound close to normal skin 09/22/15: the wound is relatively free of surface eschar. There is some surrounding eschar. I'm not exactly sure at what level the surface is that I am seeing. 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 No evidence of atypical infection, malignancy. 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 No debridement was done 09/29/15; the wound surface is relatively free of surface eschar. There is eschar surrounding the walls of the wound. Aside from  the improvement in the amount of surface slough. This wound has not progressed any towards closure. There is not even a surface that looks like there at this is ready. There is no evidence of any infection nor maligancy based on biopsy I did on 9/15. I continued with the Iodoflex however I am looking towards some alternative to try to promote some closure or filling in of this surface. Consider triple layer Oasis. Collagen did not result in adequate control of the surface slough 10/13/15; the patient was in hospital last week with severe anemia. The wound looks somewhat better after debridement although there is widening medially. There is no evidence of infection. 10/20/15; patient's wound on the right lateral lower leg is essentially unchanged. This underwent a light surface debridement and in general the debridement is easier and the  surface looks improved. I noted in doing this on the side of the wound what appears to be a piece of calcium deposition. The patient noted that she had previous things on the tips of her fingers. 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 She follows with rheumatology and I have asked them to talk to her about this. In view of that S the nonhealing ulcer may have something to do with calcinosis and also unrelieved Raynaud's disease in this area. I should note that her original wounds on the right and left medial malleolus and the inner aspects of both legs just below the knees did however heal over 10/30/15; the patient's wound on the right lateral lower leg is essentially unchanged. I was able to remove some calcified material from the medial wound edge. 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 Otherwise the wound appears essentially unchanged there is less adherent surface eschar. Some of the calcified material was sent to  pathology for analysis 11/17/15. The patient's wound on the right lateral leg is essentially unchanged. Wider Medially. 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 There is some surface slough but I did not debridement this. No evidence of surrounding infection. 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 This will make healing difficult 12/15/15; the wound has less depth surface has a fibrinous slough and calcifications in the wound edge no evidence of infection 12/22/15; the wound definitely has less Fibrinous slough on the base. Calcifications around the wound edges are still evident. Although the wound bed looks healthier it is still pale in appearance. Previous biopsy did not show malignancy 01/04/15; surgical debridement of nonviable slough and subcutaneous tissue the wound cleans up quite nicely but appears to be expanding outward calcifications around the wound edges are still there. 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 She is to see her rheumatologist I'll see if he has any opinions on this. My punch biopsy done in September did not show calcifications although these are clearly evident. 01/19/16 light selective debridement of nonviable surface slough. There is epithelialization medially. This gives me reason for cautious optimism. She has been to see her rheumatologist, there is nothing that can be done for this type of soft tissue calcification associated with scleroderma 02/02/16 no debridement although there is a light surface slough. She has 2 peninsulas of skin 1 inferiorly and one medially. We continue to make a slight and slow but definite progressive here 02/16/16; light surface  debridement with more attention to the circumference of the wound bed where the fibrinous eschar is more prevalent. No calcifications detected. She seems to have done nicely with the [facility] with some epithelialization and some improvement in the overall wound volume. 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 Continued attention to the circumference of the wound where the fibrinous eschar in calcinosis or prevalent. No calcifications were detected. She has continued improvement with Hydrofera Blue. The wound is no longer as deep 03/15/16 surgical debridement done to remove surface escha Especially around the  circumference of the wound where there is nonviable subcutaneous tissue. In spite of this there is considerable improvement in the overall dimensions and depth of the wound. Islands of epithelialization are seen especially medially inferiorly and superiorly to a lesser extent. She is using Hydrofera Blue at home 03/29/16; surgical debridement done to remove surface eschar and nonviable subcutaneous tissue. This cleans up quite nicely mention slightly larger in terms of length and width however depth is less 04/12/16; continued gradual improvement in terms of depth and the condition of the wound base. Debridement is done. Continuing long standing Hydrofera Blue at home with Kerlix Coban wraps 04/26/16; continued gradual improvement in terms of depth and management as well as condition of the wound base. Surgical debridement done she continues with Hydrofera Blue. 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 She initially came to this clinic venous insufficiency ulcers which have long since healed 05/17/16 continued improvement in terms of the depth and measurements of this wound although she has a tightly adherent fibrinous  slough each time. We've been continued with long standing Hydrofera Blue which seems to done as well for this wound is many advanced treatment options. 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 She also has chronic venous insufficiency. She has an irritation on her lateral right ankle secondary to our wraps 05/10/16; wound appears to be smaller especially on the medial aspect and especially in the width. Wound was debridement surface looks better. She is also been in the hospital apparently with anemia again she tells me she had an endoscopy. Since she got home after 3 days which I believe was sometime last week she has had an irritated painful area on the right lateral ankle surrounding the lateral malleolus 05/31/16; much more adherent surface slough today then recently although I don't think the dimensions of changed that much. A more aggressive debridement is required. The irritated area over her medial malleolus is more pruritic and painful and I don't think represents cellulitis 06/14/16; no major change in her wound dimensions however there is more tightly adherent surface slough which is disappointing. As well as she appears to have a new small area medially. Furthermore an irritated uncomfortable area on the lateral aspect of her right foot just below the lateral malleolus. 06/21/16; I'm seeing the patient back in follow-up for the new areas under her major wound on the right anterior leg. She has been using Hydrofera Blue to this area probably for several months now and although the dressings seem to be helping for quite a period of time I think things have stagnated lately. She comes in today with a relatively tight adherent surface slough and really no changes in the wound shape or dimension. The 2 small areas she had inferiorly are tiny but still open they seem improved this well. There is no uncontrolled edema and I don't think there is any evidence of  cellulitis. 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 Patient has had vascular evaluations both venous and arterial. I have biopsied this area. There is no obvious infection. The worrisome thing today is that she seems to be developing areas of erythema and epithelial damage on the medial aspect of the right foot. Also to a lesser degree inferior to the actual wound itself. Again I see no obvious changes to suggest cellulitis however as  this is the only treatable option I will probably give her antibiotics. 07/13/16 no major change in the lady's large anterior right leg wound. Still covered with a very tightly adherent surface slough which is difficult to debridement. There is less erythema around this, culture last week grew pseudomonas I gave her ciprofloxacin. The area on her lateral right malleolus looks better- 08/02/16 the patient's wounds continued to decline. Her original large anterior right leg wound looks deeper. Still adherent surface slough that is difficult to debridement. She has a small area just below this and a punched-out wound just below her lateral malleolus. In the meantime she is been in hospital with apparently an upper GI bleed on Plavix and aspirin. She is now just on Plavix she received 3 units of packed cells 08/23/16; since I last saw this 3 weeks ago, the open large area on her right leg looks about the same syrup. She has a small satellite lesion just underneath this. The area on her medial right ankle is now a deep necrotic wound. I attempted to debridement this however there is just too much pain. 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 She follows with vascular surgery and is scheduled for an angiogram in early September 09/06/16; the patient is going for an arteriogram tomorrow. Her original  large wound on the right calf is about the same the satellite lesion underneath it is about the same however the area on her medial ankle is now deeper with exposed tendon. I am no longer attempting to debride these wounds 09/20/16; the patient has undergone a right femoral endarterectomy and Dacron patch angioplasty. This seems to have helped the flow in her right leg. 10/04/16; Arrived today for aggressive debridement of the wounds on her right calf the original wound the one beneath it and a difficult area over her right lateral leg just above the lateral malleolus. 10/25/16; her 3 open wounds are about the same in terms of dimensions however the surface appears a lot healthier post debridement. Using Iodoflex 10/18/16 we have been using Iodoflex to her wounds which she tolerated with some difficulty. 10/11/16; has been using Santyl for a period of time with some improvement although again very adherent surface slough would prevent any attempted healing this. She has a original wound on the left calf, the satellite underneath that and the most recent wound on the right medial ankle. She has completed revascularization by Dr. early and has had venous ablation earlier. Want to go back to Iodoflex to see if week and get a healthier surface to this wound bed failing this I think she'll need to be taken to the OR and I am prepared to call Dr. Marla Roe to discuss this. She is obviously not a good candidate for general anesthesia however.; 11/08/16; I put her on Iodoflex last time to see if I can get the wound bed any healthier and unfortunately today still had tremendous surface slough. 11/15/16; 4 weeks' worth of Iodoflex with not much improvement. Debridement on the major wounds on her left anterior leg is easier however this does not maintain from week to week. The punched-out area on her medial right ankle 11/29/16; I attempted to change the patient last visit to [facility] however she states this  burned and was very uncomfortable therefore we gave her permission to go back to the eye out of complex which she already had at home. Also she noted  a lot of pain and swelling on the lateral aspect of her leg before she traveled to [facility] for the holidays, I called her in doxycycline over the phone. This seems to have helped 12/06/16; Wounds unchanged by in large. Using Iodoflex 12/13/16; her wounds today actually looks somewhat better. The area on the right lateral lower leg has reasonably healthy-looking granulation and perhaps as actually filled and a bit. Debridement of the 2 wounds on the medial calf is easier and post debridement appears to have a healthier base. We have referred her to Dr. Migdalia Dk for consideration of operative debridement 12/20/16; we have a quick note from Dr. Merri Ray who feels that the patient needs to be referred to an academic center/plastic surgeon. This is due to the complexity of the patient's medical issues as it applies to anything in the OR. We have been using Iodoflex 12/27/16; in general the wounds on the right leg are better in terms of the difficult to remove surface slough. She has been using Iodoflex. She is approved for Apligraf which I anticipated ordering in the next week or 2 when we get a better-looking surface 01/04/16 the deep wounds on the right leg generally look better. Both of them are debrided further surface slough. The area on the lateral right leg was not debrided. She is approved for Apligraf I think I'll probably order this either next week or the week after depending on the surface of the wounds superiorly. We have been using Iodoflex which will continue until then 01/11/16; the deep wounds on the right leg again have a surface slough that requires debridement. I've not been able to get the wound bed on either one of these wounds down to what would be acceptable for an advanced skin stab-like Apligraf. The area on the  medial leg has been improving. We have been using hot Iodoflex to all wounds which seems to do the best at at least limiting the nonviable surface 01/24/17; we have continued Iodoflex and all her wound areas. Her debridement Gen. he is easier and the surface underneath this looks viable. Nevertheless these are large area wounds with exposed muscle at least on the anterior parts. We have ordered Apligraf's for 2 weeks from now. The patient will be away next week 02/07/17; the patient was close to have first Apligraf today however we did not order one. I therefore replaced her Iodoflex. She essentially has 3 large punched- out areas on her right anterior leg and right medial ankle. 02/11/17; Apligraf #1 02/25/17; Apligraf #2. In general some improvement in the right medial ankle and right anterior leg wounds. The larger wound medially perhaps some better 03/11/17; Apligraf #3. In general continued improvement in the right medial ankle and the right anterior leg wounds. 03/25/17 Apligraf #4. In general continued improvement especially on the right medial ankle and the lower 04/08/17; Apligraf #5 in general continued improvement in all wound sites. 04/22/17; post Apligraf #5 her wound beds continued to look a lot better all of them up to the surface of the surrounding skin. Had a caramel-colored slough that I did not debrided in case there is residual Apligraf effect. The wounds are as good as I have seen these looking quite some time. 04/29/17; we applied [place] and last week after completing her Apligraf. Wounds look as though they've contracted somewhat although they have a nonviable surface which was problematic in the past. Apparently she has been approved for further Apligraf's. We applied Iodosorb today after debridement.  05/06/17; we're fortunate enough today to be able to apply additional Apligraf approved by her insurance. In general all of her wounds look better Apligraf #6 05/24/17; Apligraf #7  continued improvement in all wounds o3 06/10/17 Apligraf #8. Continued improvement in the surface of all wounds. Not much of an improvement in dimensions as I might a follow 06/24/17 Apligraf #9 continued general improvement although not as much change in the wound areas I might of like. She has a new open area on the right anterior lateral ankle very small and superficial. She also has a necrotic wound on the tip of her right index finger probably secondary to severe uncontrolled Raynaud's phenomenon. She is already on sildenafil and already seen her rheumatologist who gave her Keflex. 07/08/17; Apligraf #10. Generalized improvement although she has a small additional wound just medial to the major wound area. 07/22/17; after discussion we decided not to reorder any further Apligraf's although there is been considerable improvement with these it hasn't been recently. The major wound anteriorly looks better. Smaller wounds beneath this and the more recent one and laterally look about the same. The area on the right lateral lower leg looks about the same 07/29/17 this is a patient who is exceedingly complex. She has advanced scleroderma, crest syndrome including calcinosis, PAD status post revascularization, chronic venous insufficiency status post ablations. She initially presented to this clinic with wounds on her bilateral lower legs however these closed. More recently we have been dealing with a large open area superiorly on the right anterior leg, a smaller wound underneath this and then another one on the right medial lower extremity. These improve significantly with 10 Apligraf applications. Over the last 2-3 weeks we are making good progress with Hydrofera Blue and these seem to be making progress towards closure 08/19/17; wounds continued to make good improvement with Hydrofera Blue and episodic aggressive debridements 08/26/17; still using Hydrofera Blue. Good improvements 09/09/17; using Hydrofera  Blue continued improvement. Area on the lateral part of her right leg has only a small remaining open area. The small inferior satellite region is for all intents and purposes closed. Her major wound also is come in in terms of depth and has advancing epithelialization. 09/16/17; using Hydrofera Blue with continued improvement. The smaller satellite wound. We've closed out today along with a new were satellite wound medially. The area on her medial ankle is still open and her major wound is still open but making improvement. All using Hydrofera Blue. Currently 09/30/17; using Hydrofera Blue. Still a small open area on the lateral right ankle area and her original major wound seems to be making gradual and steady improvement. 10/14/17; still using Hydrofera Blue. Still too small open areas on the right lateral ankle. Her original major wound is horizontal and linear. The most problematic area paradoxically seems to be the area to the medial area wears I thought it would be the lateral. 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 10/28/17; still using Hydrofera Blue. Right lateral ankle has a very small open area superiorly on the most lateral part of the wound. Her original open wound has 2 open areas now separated by normal skin and we've redefined this. 11/11/17; still using Hydrofera Blue area and right lateral ankle continues to have a small open area on mostly the lateral part of the wound. Her original wound has 2 small open areas now separated by a considerable amount of normal skin 11/28/17; the patient called  in slightly before Thanksgiving to report pain and erythema above the wound on the right leg. In the past this is responded well to treatment for cellulitis and I gave her over the phone doxycycline. She stated this resulted in fairly abrupt improvement. We have been using Hydrofera Blue for a prolonged period of time to the larger wound  anteriorly into the remaining wound on her right right lateral ankle. The latter is just about closed with only a small linear area and the bottom of the [place]. 12/02/17; use endoform and left the dressing on since last visit. There is no tenderness and no evidence of infection. 12/16/17; patient has been using Endoform but not making much progress. The 2 punched out open areas anteriorly which were the reminiscence of her major wound appear deeper. The area on the lateral aspect of her right calf also appears deeper. Also she has a puzzling tender swelling above her wound on the right leg. This seems larger than last time. Just above her wounds there appears to be some fluctuance in this area it is not erythematous and there is no crepitus 12/30/17; patient has been using Endoform up until last week we used Hydrofera Blue. Ultrasound of the swelling above her 2 major wounds last time was negative for a fluid collection. I gave her cefaclor for the erythema and tenderness in this area which seems better. Unfortunately both punched out areas anteriorly and the area on her right medial lower leg appear deeper. In fact the lateral of the wounds anteriorly actually looks as though it has exposed tendon and/or muscle sheath. She is not systemically unwell. She is complaining of vaginitis type symptoms presumably Candida from her antibiotics. 01/06/18; we're using santyl. she has 2 punched out areas anteriorly which were initially part of a large wound. Unfortunately medially this is now open to tendon/muscle. All the wounds have the same adherent very difficult to debride surface. 01/20/18; 2 week follow-up using Santyl. She has the 2 punched out areas anteriorly which were initially part of her large surface wound there. Medially this still has exposed muscle. All of these have the same tightly adherent necrotic surface which requires debridement. PuraPly was not accepted by the patient's insurance however  her insurance I think it changed therefore we are going to run Apligraf to gain 02/03/18; the patient has been using Santyl. The wound on the right lateral ankle looks improved and the 2 areas anteriorly on the right leg looks about the same. The medial one has exposed muscle. The lateral 1 requiresdebridement. We use PuraPly today for the first week 02/10/18; PuraPly #2. The patient has 3 wounds. The area on the right lateral ankle, 2 areas anteriorly that were part of her original large wound in this area the medial one has exposed muscle. All of the wounds were lightly debrided with a number 3 curet. PuraPly #2 applied the lateral wound on the calf and the right lateral ankle look better. 02/17/18; PuraPly #3. Patient has 3 wounds. The area on the right lateral ankle in 2 areas internally that were part of her original large wound. The lateral area has exposed muscle. She arrived with some complaints of pain around the right ankle. 02/24/18; PuraPly #4; not much change in any of the 3 wound areas. Right lateral ankle, right lateral calf. Both of these required debridement with a #3 curet. She tolerates this marginally. The area on the medial leg still has exposed muscle. Not much change in dimensions 03/03/18 PuraPly #  5. The area on the medial ankle actually looks better however the 2 separate areas that were original parts of the larger right anterior leg wound look as though they're attempting to coalesce. 03/10/18; PuraPly #6. The area on the medial ankle actually continues to look and measures smaller however the 2 separate areas that were part of the original large wound on the right anterior leg have now coalesced. There hasn't been much improvement here. The lateral area actually has underlying exposed muscle 03/17/18-she is here in follow-up evaluation for ulcerations to the right lower extremity. She is voicing no complaints or concerns. She tolerated debridement. Puraply#7 placed 03/24/18;  difficult right lower extremity ulcerations. PuraPly #8 place. She is been approved for [employer]. She did very well with Apligraf today however she is apparently reached her "lifetime max" 03/31/18; marginal improvement with PuraPly although her wounds looked as good as they have in several weeks today. Used TheraSkins #1 04/14/18 TheraSkin #2 today 04/28/18 TheraSkins #3. Wound slightly improved 05/12/18; TheraSkin skin #4. Wound response has been variable. 05/27/18 TheraSkin #5. Generally improvement in all wound areas. I've also put her in 3 layer compression to help with the severe venous hypertension 06/09/18; patient is done quite well with the TheraSkins unfortunately we have no further applications. I also put her in 3 layer compression last week and that really seems to of helped. 06/16/18; we have been using silver collagen. Wounds are smaller. Still be open area to the muscle layer of her calf however even that is contracted somewhat. She tells me that at night sometimes she has pain on the right lateral calf at the site of her lower wound. Notable that I put her into 3 layer compression about 3 weeks ago. She states that she dangles her leg over the bed that makes it feel better but she does not describe claudication during the day ooShe is going to call her secondary insurance to see if they will continue to cover advanced treatment products I have reviewed her arterial studies from 01/22/17; this showed an ABI in the right of 1 and on the left noncompressible. TBI on the right at 0.30 on the left at 0.34. It is therefore possible she has significant PAD with medial calcification falsely elevating her ABI into the normal range. I'll need to be careful about asking her about this next week it's possible the 3 layer compression is too much 06/23/18; was able to reapply TheraSkin 1 today. Edema control is good and she is not complaining of pain no claudication 07/07/18;no major change. New wound  which was apparently a taper removal injury today in our clinic between her 2 wounds on the right calf TheraSkins #2 07/14/18; I think there is some improvement in the right lateral ankle and the medial part of her wound. There is still exposed muscle medially. 07/28/18; two-week follow-up. TheraSkins#3. Unfortunately no major change. She is not a candidate I don't think for skin grafting due to severe venous hypertension associated with her scleroderma and pulmonary hypertension 08/11/18 Patient is here today for her [employer] application #28 (#5 of the second set). She seems to be doing well and in the base of the wound appears to show some progress at this point. This is the last approved [employer]in of the second set. 08/25/18; she has completed TheraSkin. There has been some improvement on the right lateral calf wound as well as the anterior leg wounds. The open area to muscle medially on the anterior leg wound is smaller.  I'm going to transition her back to [facility] under Kerlix Coban change every second day. She reports that she had some calcification removed from her right upper arm. We have had previous problems with calcifications in her wounds on her legs but that has not happened recently 09/08/18;using Hydrofera Blue on both her wound areas. Wounds seem to of contracted nicely. She uses Kerlix Coban wrap and changes at home herself 09/22/2018; using Hydrofera Blue on both her wound areas. Dimensions seem to have come down somewhat. There is certainly less depth in the medial part of the mid tibia wound and I do not think there is any exposed muscle at this point. 10/06/2018; 2-week follow-up. Using [facility] on her wound areas. Dimensions have come down nicely both on the right lateral ankle area in the right mid tibial area. She has no new complaints 10/20/2018; 2-week follow-up. She is using Hydrofera Blue. Not much change from the last time she was here. The area on the lateral ankle  has less depth although it has raised edges on one side. I attempted to remove as much of the raised edge as I could without creating more additional wounds. The area on the right anterior mid tibia area looks the same. 11/03/2018; 2-week follow-up she is using Hydrofera Blue. On the right anterior leg she now has 2 wounds separated by a large area of normal skin. The area on the medial part still has I think exposed muscle although this area itself is a lot smaller. The area laterally has some depth. Both areas with necrotic debris. ooThe area on her right lateral ankle has come in nicely 11/17/2018; patient continues to use Hydrofera Blue. We have been increasing separation of the 2 wounds anteriorly which were at one point joint the area on the right lateral calf continues to have I think some improvement in depth. 12/08/2018; patient continues to use Hydrofera Blue. There is some improvement in the area on the right lateral calf. The 2 areas that were initially part of the original large wound in the mid right tibia are probably about the same. In fact the medial area is probably somewhat larger. We will run puraply through the patient's insurance 12/22/2018; she has been using Hydrofera Blue. We have small wounds on the right lateral calf and 2 small areas that were initially part of a large wound in the right mid tibia. We applied pure apply #1 today. 12/29/2018; we applied puraply #2. Her wounds look somewhat better especially on the right lateral calf and the lateral part of her original wound in the mid tibial area. 01/05/2019; perhaps slightly improved in terms of wound bed condition but certainly not as much improvement as I might of liked. Puraply #3 1/13: we did not have a correct sized puraply to apply. wounds more pinched out looking, I increased her compression to 3 layer last week to help with significant multilevel venous hypertension. Since then I've reviewed her arterial status. She  has a right femoral endarterectomy and a distal left SFA stent. She was being followed by Dr. [name] for a period however she does not appear to have seen him in 3 years. I will set up an appointment. 1/20. The patient has an additional wound on the right lateral calf between the distal wound and proximal wounds. We did not have Puraply last week. Still does not have a follow-up with Dr. early 1/27: Follow-up with Dr. early has been arranged apparently with follow-up noninvasive studies. Wounds are  measuring roughly the same although they certainly look smaller 2/3; the patient had non invasive studies. Her ABIs on the right were 0.83 and on the left 1.02 however there was no great toe pressure bilaterally. Also worrisome monophasic waveforms at the PTA and dorsalis pedis. We are still using Puraply. We have had some improvement in all of the wounds especially the lateral part of the mid tibial area. 2/10; sees Dr. early of vein and vascular re-arterial studies next week. Puraply reapplied today. 2/17; Dr. early of vein and vascular his appointment is tomorrow. Puraply reapplied after debridement of all wounds 2/24; the patient saw Dr. early I reviewed his note. sHe noted the previous right femoral endarterectomy with a Dacron patch. He also noted the normal ABI and the monophasic waveforms suggesting tibial disease. Overall he did not feel that she had any evidence of arterial insufficiency that would impair her wound healing. He did note her venous disease as well. He suggested PRN follow-up. 3/2; I had the last puraply applied today. The original wounds over the mid tibia area are improved where is the area on the right lower leg is not 3/9; wounds are smaller especially in the right mid tibia perhaps slightly in the right lateral calf. We finished with puraply and went to endoform today 3/23; the patient arrives after 2 weeks. She has been using endoform. I think all of her wounds look slightly  better which includes the area on the right lateral calf just above the right lateral malleolus and the 2 in the right mid tibia which were initially part of the same wound. 4/27; TELEHEALTH visit; the patient was seen for telehealth visit today with her consent in the middle of the worldwide epidemic. Since she was last here she called in for antibiotics with pain and tenderness around the area on the right medial ankle. I gave her empiric doxycycline. She states this feels better. She is using endoform on both of these areas 5/11 TELEHEALTH; the patient was seen for telehealth visit today. She was accompanied at home by her husband. She has severe pulmonary hypertension accompanied scleroderma and in the face of the Covid epidemic cannot be safely brought into our clinic. We have been using endoform on her wound areas. There are essentially 3 wound areas now in the left mid tibia now 2 open areas that it one point were connected and one on the right lateral ankle just above the malleolus. 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 She cannot come to clinic. The wounds were reviewed today via telehealth. She has severe chronic venous hypertension which I think is centrally mediated. She has wounds on her right anterior tibia and right lateral ankle area. These are chronic. She has been using endoform. 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 We have been following her from telehealth. She has severe chronic venous hypertension which may be mostly centrally mediated secondary to right heart heart failure. She has wounds on her right anterior tibia and right lateral ankle these are chronic we  have been using endoform 6/22; TELEHEALTH; this patient was seen today via telehealth. 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 Since the last time we had contact with this patient she developed some pain and erythema around the wound on her right lateral malleolus/ankle and we put in antibiotics for her. This is resulted in good improvement with resolution of the erythema and tenderness. I changed her to silver alginate last time. We had been using endoform for an extended period of time 7/13; TELEHEALTH; this patient was seen today via telehealth. 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 She would continue to be at a prohibitive risk to be brought into our clinic unless this was absolutely necessary. These visits have been done with her approval as well as her husband. We have been using silver alginate to the areas on the right mid tibia and right lateral lower leg. 7/27 TELEHEALTH; patient was seen along with her husband today via telehealth. 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 We changed her to sample last visit. She has 2 areas a chronic wound on her right mid tibia and one just above her ankle. These were not the original wounds when she came into this clinic but she developed them during treatment 8/17; she comes in for her first face-to-face visit in a long period. She has a remaining area just medial to the right tibia which is the last open part of her large wound across this area. She also has an area on the right lateral lower leg. We prescribed Santyl last telehealth visit but they were concerned that this was making a deeper so they put silver alginate on it last week. Her husband changes the dressings. 8/31; using Santyl to the 2 wound areas some improvement in  wound surfaces. Husband has surgery in 2 weeks we will put her out 3 weeks. Any of the advanced treatment options that I can think of that would be eligible for this wound would also cause her to have to come in weekly. The risk that the patient is just too high 9/21. Using Santyl to the 2 wound areas. Both of these are somewhat better although the medial mid tibia area still has exposed muscle. Lateral ankle requiring debridement. Using Santyl 10/12; using Santyl to the 2 wound areas. One on the right lateral ankle and the other in the medial calf which still has exposed muscle. Both areas have come down in size and have better looking surfaces. She has made nice progress with santyl 11/2; we have been using Santyl to the 2 wound areas. Right lateral ankle and the other in the mid tibia area the medial part of this still has exposed muscle. 11/23/2019 on evaluation today patient appears to be doing about the same really with regard to her wounds. She is actually not very pleased with how things seem to be progressing at this point she tells me that she really has not noted much improvement unfortunately. With that being said there is no signs of active infection at this time. There is some slough buildup noted at this time which again along with some dry skin around the edges of the wound I think would benefit her to try to debride some of this away. Fortunately her pain is doing fairly well. She still has exposed muscle in the right medial/tibial area. 12/14; TELEHEALTH; she was changed to [facility] to the right calf wound and right lateral ankle wound when she was here  last time. Unfortunately since then she had a fall with a pelvic fracture and a fracture of her wrist. She was apparently hospitalized for 5 days. I have not looked at her discharge summary. She apparently came out of the hospital with a blister on her right heel. She was seen today via telehealth by myself and our case manager.  The patient and her husband were present. She has been using Hydrofera Blue at our direction from the last time she was in the clinic. There is been no major improvement in fact the areas appear deeper and with a less viable surface 01/04/2020; TELEHEALTH; the patient was seen today in accompaniment of her husband and our nurse. She has 3 open areas 1 on the right medial mid tibia, one on the right lateral ankle and a large eschared pressure area on the right heel. We have been using Iodoflex to the 2 original wounds. 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 It is simply too perilous for her to be seen in any other way 1/26; TELEHEALTH; the patient was seen today in accompaniment of her husband and our RN one of our nurses. She still has the 3 open areas 1 on the right medial mid tibia which is the remanent of a more extensive wound in this area, one on the right lateral ankle and a large eschared pressure area on the right heel. We have been using Iodoflex to the 2 original wounds and a bed at 9 application to the eschared area on the heel 2/15; the first time we have seen this patient and then several months out of concern for the pandemic. She had a large horizontal wound in the mid tibia. Only the medial aspect of this is still open. Area on the lateral ankle is just about closed. She had a new pressure ulcer on the right heel which I have removed some of the eschar. We have been using Iodoflex which I will continue. The area in the mid tibia has a round circle in the middle of exposed muscle. I think we would have to use an advanced treatment product to stimulate granulation over this area. We will run this through her insurance. She is not eligible for plastic surgery for many reasons 3/1; TELEHEALTH. The patient was seen today by telehealth. 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 We have been using Iodoflex to the wound areas which include the right anterior mid tibia, right lateral ankle and the right heel. All of these were reviewed 3/8; the patient's wound just above the right ankle is closed. She still has a contracting black eschar on the heel where she had a pressure ulcer. The medial part of her original wound on the mid tibia has exposed muscle. We have made really made no progress in this area although we have managed to get a lot of the original wound in this area to close. We used Apligraf #1 today 3/22; the patient's ankle wound remains closed. She still has a contracting black eschar on the heel although it seems to have less surface area. It still not clear whether there is any depth here. We used Apligraf #2 today in an attempt to get granulation over the exposed muscle and what is left of her mid tibia area. 4/5; everything is closed except the medial aspect of the mid tibia wound, as well as the pressure injury on  the right heel. She has been using Betadine to the right heel which has been gradually contracting. We used Apligraf #3 today. 4/19; Apligraf #4. Still a pressure area on the right heel she has been using Betadine superficial excoriated areas she has been applying salicylic acid to on the anterior leg and the thick horn on the leg just above her wound area 5/3; Apligraf #5. Unfortunately she came in today with a reopening of the area on the right lateral lower leg. Not much change in the wound we have been treating in the mid calf. Quite a bit of edema surrounding this wound. She reports that she was up on her feet quite a bit. The area on the right heel is separating she has been using Betadine She comes in with a new wound on the left lateral malleolus which is very disappointing this is been open since last week 5/17; we applied her last Apligraf last time. Unfortunately that did not have any effect on the deep area medially  in the mid tibial area. However the area over the right lateral malleolus is a lot better. Right heel also is contracted we used Iodoflex last time. The new wound on the left lateral malleolus were using Santyl to. This required debridement. 6/1. Not much change in the area on the right medial mid tibia. Right lateral ankle is improved she has the necrotic area on the right heel which was a pressure area. New area on the left lateral malleolus from last time. I changed her to Iodoflex to the area on the left lateral malleolus she said this hurt I have put her back on Santyl 6/15; right mid tibia is unchanged. I do not think there is anything I can do to this topically that we will get this to granulate over the muscle. And I am furthermore I am not sure that she is a surgical candidate either because of her severe pulmonary issues or because she really does not want to go through it. ooThe area on the right lateral malleolus is a small punched out area. ooThe area on the left lateral malleolus again small punched out with nonviable surface ooPressure ulcer on the right heel at separating black eschar. I went ahead and remove this today Lura Em has an area on the left anterior mid tibia just laterally. Geographic wounds debris on the surface. Some erythema here. 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 There is skin changes widely Change the primary dressing to Sorbact to all wound areas. She needs compression on the left leg as well as the right 6/21; right mid tibia which was her one remaining wound at 1 point is unchanged ooThe area on the right lateral malleolus actually is close to closing over still a small punched out area ooThe area on the left lateral malleolus again a deep wound it is this 1 that she feels pain in. ooPressure ulcer on the right heel was cultured today. ooNew area on the left anterior mid tibia several geographic wounds last week in the setting  of chronic venous insufficiency this actually looks some better 7/6; ooRight mid tibia about the same. Exposed muscle ooLeft lateral malleolus again necrotic debris over the surface. ooPressure ulcer on the right heel. She finished the Keflex we prescribed. Necrotic debris debrided with a #3 curette ooThe rest of her wounds on the right mid tibia right lateral ankle are better Her husband reminds me that she had a right femoral endarterectomy and has 2  stents in her left thigh placed in 2011 approximately by vascular surgery in [place]. She saw Dr. [name] last in February 2020. At that point he did not think that the arterial insufficiency she had on the right was sufficient to explain any of her right lower extremity wounds however she now has an area on the left lateral malleolus that looks like an ischemic wound 7/12; we have been using Sorbact all wound areas ooRight mid tibia about the same exposed muscle ooLeft lateral malleolus small wound with debris in the surface punched out ooPressure ulcer of the right heel requiring debridement of necrotic debris ooLateral ankle wound on the right is just about closed ooRight mid tibia wound about the same still with exposed muscle. 7/26 really no change in any wounds. We have been using Sorbact. She has bilateral lower extremity wounds. This is in the setting of scleroderma, severe venous hypertension. She also has known PAD and I had really hoped to be able to get a review by Dr. [name] but apparently that is not booked until August 31. I have asked her to call back to vein and vascular and get an appointment with somebody a little earlier if that is possible. 8/16; patient's area on the right ankle which was a chronic wound is closed over again. The area on the right mid lower leg, right heel left lateral ankle are all about the same. Pressure ulcer on the right heel is still not viable. New areas identified on the right lateral calf. She  has a follow-up with Dr. [name] on 8/31. Phenomenally I would like him to go over her arterial status with regards to her underlying stents. 9/13; patient had her ARTERIAL studies done however apparently Dr. [name] is now in [place] so she did not get an appointment with him on 8/31 and was not referred to any other provider. On the right side her PTA was monophasic. Noncompressible. TBI on the right at 0.41 on the left she was monophasic and noncompressible they did not do a TBI because she had a Band-Aid on her big toe. Patient does not describe claudication although she is having a lot of pain in the left lateral malleolus. Objective Constitutional Patient is hypertensive.. Pulse regular and within target range for patient.Marland Kitchen Respirations regular, non-labored and within target range.. Temperature is normal and within the target range for the patient.Marland Kitchen Appears in no distress. Vitals Time Taken: 1:30 PM, Height: 68 in, Weight: 132 lbs, BMI: 20.1, Temperature: 97.9 F, Pulse: 88 bpm, Respiratory Rate: 18 breaths/min, Blood Pressure: 172/74 mmHg. General Notes: Wound exam ooMidportion of the right tibia still with exposed muscle not much change. ooSmall area on the right lateral malleolus. ooPressure area on the plantar right heel ooLeft lateral malleolus nonviable surface. ooShe has the skin surgery sites on the left anterior tibia x2 we have not been specifically addressing this. Integumentary (Hair, Skin) Wound #16 status is Open. Original cause of wound was Pressure Injury. The wound is located on the Right Calcaneus. The wound measures 1.5cm length x 0.6cm width x 1.1cm depth; 0.707cm^2 area and 0.778cm^3 volume. There is Fat Layer (Subcutaneous Tissue) exposed. There is no tunneling or undermining noted. There is a small amount of serosanguineous drainage noted. The wound margin is flat and intact. There is small (1-33%) pink granulation within the wound bed. There is a large (67-100%)  amount of necrotic tissue within the wound bed including Adherent Slough. Wound #19 status is Open. Original cause of wound was Gradually  Appeared. The wound is located on the Right,Lateral Malleolus. The wound measures 0.3cm length x 0.2cm width x 0.2cm depth; 0.047cm^2 area and 0.009cm^3 volume. There is Fat Layer (Subcutaneous Tissue) exposed. There is no tunneling or undermining noted. There is a small amount of serous drainage noted. The wound margin is flat and intact. There is no granulation within the wound bed. There is a large (67-100%) amount of necrotic tissue within the wound bed including Adherent Slough. Wound #20 status is Open. Original cause of wound was Gradually Appeared. The wound is located on the Left,Lateral Malleolus. The wound measures 0.7cm length x 0.7cm width x 0.7cm depth; 0.385cm^2 area and 0.269cm^3 volume. There is Fat Layer (Subcutaneous Tissue) exposed. There is no tunneling or undermining noted. There is a small amount of purulent drainage noted. The wound margin is well defined and not attached to the wound base. There is small (1- 33%) pink granulation within the wound bed. There is a large (67-100%) amount of necrotic tissue within the wound bed including Adherent Slough. Wound #23 status is Open. Original cause of wound was Gradually Appeared. The wound is located on the Right,Posterior Lower Leg. The wound measures 1.2cm length x 0.5cm width x 0.1cm depth; 0.471cm^2 area and 0.047cm^3 volume. There is Fat Layer (Subcutaneous Tissue) exposed. There is no tunneling or undermining noted. There is a small amount of purulent drainage noted. The wound margin is distinct with the outline attached to the wound base. There is no granulation within the wound bed. There is a large (67-100%) amount of necrotic tissue within the wound bed including Adherent Slough. Wound #5 status is Open. Original cause of wound was Gradually Appeared. The wound is located on the  Right,Medial Lower Leg. The wound measures 2cm length x 2.3cm width x 0.4cm depth; 3.613cm^2 area and 1.445cm^3 volume. 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 Dressing Change Frequency: Change Dressing every other day. - all wounds Skin Barriers/Peri-Wound Care: Moisturizing lotion - both legs Wound Cleansing: May shower with protection. Primary Wound Dressing: Wound #16 Right Calcaneus: Cutimed Sorbact Wound #19 Right,Lateral Malleolus: Cutimed Sorbact Wound #20 Left,Lateral Malleolus: Santyl Ointment Wound #23 Right,Posterior Lower Leg: Cutimed Sorbact Wound #5 Right,Medial Lower Leg: Cutimed Sorbact Secondary Dressing: Wound #16 Right Calcaneus: Dry Gauze Wound #19 Right,Lateral Malleolus: Dry Gauze Wound #20 Left,Lateral Malleolus: Dry Gauze Wound #5 Right,Medial Lower Leg: Dry Gauze Edema Control: Kerlix and Coban - Bilateral Avoid standing for long periods of time Elevate legs to the level of the heart or above for 30 minutes daily and/or when sitting, a frequency of: - throughout the day Off-Loading: Turn and reposition every 2 hours Other: - float heels off of bed/chair with pillow under calves 1. I am going to ask our staff to make an urgent appointment with somebody at vein and vascular to look at her arterial studies. Her husband says she has 2 stents in the upper leg 2 stents in the left calf and one in the right calf area. Week this was from 10 or 11 years ago  in [place]. I do not have  any other information on this. 2. I am continue use Sorbact and all wounds except the left lateral calf we are using Santyl there 3. No further debridement until we get vascular to look at her arterial status Electronic Signature(s) Signed: 09/13/2020 5:12:45 PM By: Linton Ham MD Entered By: Linton Ham on 09/12/2020 14:16:03 -------------------------------------------------------------------------------- SuperBill Details Patient Name: Date of Service: [name] [name], [name] Manges. 09/12/2020 Medical Record Number: [id number] Patient Account Number: [id number] Date of Birth/Sex: Treating RN: 07/13/1951 (69 y.o. 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 I87.331 Chronic venous hypertension (idiopathic) with ulcer and inflammation of right lower extremity L89.616 Pressure-induced deep tissue damage of right heel L97.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 Facility Procedures CPT4 Code: [id number] Description: 67014 - WOUND CARE VISIT-LEV 5 EST PT Modifier: Quantity: 1 Physician Procedures : CPT4 Code Description Modifier [id number] - WC PHYS LEVEL 3 - EST PT ICD-10 Diagnosis Description L57.972 Non-pressure chronic ulcer of right calf with necrosis of muscle L97.811 Non-pressure chronic ulcer of other part of right lower leg limited to  breakdown of skin I87.331 Chronic venous hypertension (idiopathic) with ulcer and inflammation of right lower extremity L89.616 Pressure-induced deep tissue  damage of right heel Quantity: 1 Electronic Signature(s) Signed: 09/13/2020 5:12:45 PM By: Linton Ham MD Signed: 09/14/2020 5:57:30 PM By: Levan Hurst RN, BSN Entered By: Levan Hurst on 09/12/2020 17:07:12

## 2020-09-15 ENCOUNTER — Other Ambulatory Visit: Payer: Self-pay | Admitting: Internal Medicine

## 2020-09-19 ENCOUNTER — Encounter: Payer: Self-pay | Admitting: Surgery

## 2020-09-19 ENCOUNTER — Ambulatory Visit (INDEPENDENT_AMBULATORY_CARE_PROVIDER_SITE_OTHER): Payer: Medicare Other | Admitting: Surgery

## 2020-09-19 ENCOUNTER — Other Ambulatory Visit: Payer: Self-pay

## 2020-09-19 VITALS — BP 179/67 | HR 72 | Temp 98.6°F | Resp 20 | Ht 67.5 in | Wt 122.0 lb

## 2020-09-19 DIAGNOSIS — I7025 Atherosclerosis of native arteries of other extremities with ulceration: Secondary | ICD-10-CM | POA: Diagnosis not present

## 2020-09-19 NOTE — H&P (View-Only) (Signed)
 [facility] [place]  Patient name: [name] [name] MRN: [id number]        DOB: 06/26/1951          Sex: female   REASON FOR VISIT:    Follow up  HISOTRY OF PRESENT ILLNESS:    [name] [name] is a 68 y.o. female who is a patient of Dr. Early.  He has performed laser ablation for treatment of venous stasis disease and also a right femoral endarterectomy with patch angioplasty on 09/14/2016  Previously, the patient has been treated in [place] with a left common femoral endarterectomy and left superficial femoral artery stent as well as a right superficial femoral artery stent.  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  She has nonhealing wounds on her right leg that are being treated at the wound center.  She also has scabs on her left leg from biopsy sites.  The patient suffers from scleroderma and has undergone several finger amputations.  She is a non-smoker.  She is medically managed for hypertension.   PAST MEDICAL HISTORY:       Past Medical History:  Diagnosis Date  . Anemia   . Arthritis   . DOE (dyspnea on exertion)   . Epistaxis   . History of chicken pox   . Hypertension   . Hypothyroidism   . Leg ulcer (HCC)   . Melanoma (HCC) 1999  . 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HCC)   . Non Hodgkin's lymphoma (HCC)    Treated with chemo 2010, felt to be cured.  . PAH (pulmonary artery hypertension) (HCC)   . Peripheral arterial occlusive disease (HCC)   . Pulmonary hypertension (HCC)   . Requires supplemental oxygen    2 liters-3liters when moving  . Sclerodermia generalized (HCC)      FAMILY HISTORY:        Family History  Problem Relation Age of Onset  . Lupus Father   . Lymphoma Father   . Hyperlipidemia Mother   . Cancer Daughter        sarcoma  . Colon cancer Other     SOCIAL HISTORY:   Social History        Tobacco Use  . Smoking status: Never  Smoker  . Smokeless tobacco: Never Used  Substance Use Topics  . Alcohol use: Yes    Alcohol/week: 0.0 standard drinks    Comment: 1-2 drinks per day wine.liquor or beer     ALLERGIES:        Allergies  Allergen Reactions  . Levofloxacin Other (See Comments)    FLU LIKE SYMPTOM Other reaction(s): Other (See Comments) FLU LIKE SYMPTOM  . Sulfa Antibiotics Rash and Other (See Comments)    Face peeled Severe rash with significant peeling of face. No airway involvement per patient Face peeled  . Levofloxacin In D5w Other (See Comments)    FLU LIKE SYMPTOMS FLU LIKE SYMPTOMS  . Doxycycline Hyclate Swelling    SWELLING REACTION UNSPECIFIED  "TABLETS ONLY - CAPSULES ARE TOLERATED FINE"  . Penicillins Rash    Has patient had a PCN reaction causing immediate rash, facial/tongue/throat swelling, SOB or lightheadedness with hypotension: no Has patient had a PCN reaction causing severe rash involving mucus membranes or skin necrosis: no Has patient had a PCN reaction that required hospitalization: no Has patient had a PCN reaction occurring within the last 10 years: {no If all of the above answers are "NO", then may proceed with   Cephalosporin use.     CURRENT MEDICATIONS:         Current Outpatient Medications  Medication Sig Dispense Refill  . acetaminophen (TYLENOL) 500 MG tablet Take 1 tablet (500 mg total) by mouth every 12 (twelve) hours. 30 tablet 0  . Biotin 5000 MCG TABS Take 5,000 mcg by mouth daily.    . CALCIUM-MAGNESIUM-VITAMIN D PO Take 2 tablets by mouth daily.    . Cholecalciferol (VITAMIN D) 2000 units CAPS Take 2,000 Units by mouth daily.    . citalopram (CELEXA) 20 MG tablet TAKE 1 TABLET BY MOUTH EVERYDAY AT BEDTIME 90 tablet 3  . clopidogrel (PLAVIX) 75 MG tablet TAKE 1 TABLET BY MOUTH EVERY DAY 90 tablet 3  . FERREX 150 150 MG capsule TAKE 1 CAPSULE BY MOUTH TWICE A DAY 180 capsule 1  . fish oil-omega-3 fatty acids 1000 MG capsule  Take 1,000 mg by mouth 2 (two) times daily.     . folic acid (FOLVITE) 1 MG tablet TAKE 1 TABLET BY MOUTH EVERY DAY 90 tablet 1  . levothyroxine (SYNTHROID) 50 MCG tablet TAKE 1 TABLET BY MOUTH DAILY BEFORE BREAKFAST 90 tablet 1  . loperamide (IMODIUM) 2 MG capsule Take by mouth as needed for diarrhea or loose stools.    . magnesium oxide (MAG-OX) 400 (241.3 Mg) MG tablet Take 0.5 tablets (200 mg total) by mouth 2 (two) times daily. 60 tablet 0  . Multiple Vitamin (MULTIVITAMIN WITH MINERALS) TABS tablet Take 1 tablet by mouth daily.    . OPSUMIT 10 MG tablet Take 1 tablet (10mg) by mouth daily. 30 tablet 11  . OXYGEN Inhale 2-3 L into the lungs continuous.     . pantoprazole (PROTONIX) 40 MG tablet Take 1 tablet (40 mg total) by mouth 2 (two) times daily. 60 tablet 1  . potassium chloride 20 MEQ/15ML (10%) SOLN TAKE 2 TEASPOONFUL (DILUTED IN WATER) ONCE DAILY 473 mL 11  . predniSONE (DELTASONE) 5 MG tablet Take 5 mg by mouth daily.    . Probiotic CAPS Take 1 capsule by mouth daily.    . RESTASIS 0.05 % ophthalmic emulsion Place 1 drop into both eyes 2 (two) times daily.    . rosuvastatin (CRESTOR) 5 MG tablet Take 1 tablet every Monday, Wednesday, and Friday. 45 tablet 3  . saccharomyces boulardii (FLORASTOR) 250 MG capsule Take 1 capsule (250 mg total) by mouth 2 (two) times daily. 60 capsule 0  . sildenafil (REVATIO) 20 MG tablet Take 3 tablets (60 mg total) by mouth 3 (three) times daily. Please call for office visit [phone] 810 tablet 1  . spironolactone (ALDACTONE) 25 MG tablet TAKE 1 TABLET BY MOUTH EVERY DAY 90 tablet 0  . torsemide (DEMADEX) 20 MG tablet Take 40 mg by mouth 2 (two) times daily.    . valACYclovir (VALTREX) 500 MG tablet Take 1 tablet (500 mg total) by mouth 2 (two) times daily. 10 tablet 0  . vitamin B-12 (CYANOCOBALAMIN) 1000 MCG tablet Take 1,000 mcg by mouth daily.     No current facility-administered medications for this visit.    REVIEW  OF SYSTEMS:   [X] denotes positive finding, [ ] denotes negative finding Cardiac  Comments:  Chest pain or chest pressure:    Shortness of breath upon exertion:    Short of breath when lying flat:    Irregular heart rhythm:        Vascular    Pain in calf, thigh, or hip brought on by ambulation:      Pain in feet at night that wakes you up from your sleep:     Blood clot in your veins:    Leg swelling:         Pulmonary    Oxygen at home:    Productive cough:     Wheezing:         Neurologic    Sudden weakness in arms or legs:     Sudden numbness in arms or legs:     Sudden onset of difficulty speaking or slurred speech:    Temporary loss of vision in one eye:     Problems with dizziness:         Gastrointestinal    Blood in stool:     Vomited blood:         Genitourinary    Burning when urinating:     Blood in urine:        Psychiatric    Major depression:         Hematologic    Bleeding problems:    Problems with blood clotting too easily:        Skin x   Rashes or ulcers:        Constitutional    Fever or chills:      PHYSICAL EXAM:      Vitals:   09/19/20 1427  BP: (!) 179/67  Pulse: 72  Resp: 20  Temp: 98.6 F (37 C)  SpO2: 91%  Weight: 122 lb (55.3 kg)  Height: 5' 7.5" (1.715 m)    GENERAL: The patient is a well-nourished female, in no acute distress. The vital signs are documented above. CARDIAC: There is a regular rate and rhythm.  VASCULAR: Nonpalpable pedal pulses PULMONARY: Non-labored respirations ABDOMEN: Soft and non-tender with normal pitched bowel sounds.  MUSCULOSKELETAL: There are no major deformities or cyanosis. NEUROLOGIC: No focal weakness or paresthesias are detected. SKIN: See photo below  PSYCHIATRIC: The patient has a normal affect.      STUDIES:   Reviewed the following ultrasound study: ABIs were unreliable  due to calcified vessels.  Waveforms bilaterally were monophasic Right toe pressure was 78  MEDICAL ISSUES:   Nonhealing bilateral lower extremity ulcers (right more severe than the left).  She has undergone bilateral femoral endarterectomy and bilateral superficial femoral artery stenting.  Ultrasound today shows monophasic bilateral lower extremity waveforms.  She has not had angiographic imaging for 4 years.  I discussed proceeding with an arteriogram.  This would be a left femoral access with focus and intervention on the right leg if indicated.  I will try to get this done as soon as possible.  The patient's earliest date is    Wells Prakash Kimberling, IV, MD, FACS [facility] [place] Tel [phone] Pager [phone]  

## 2020-09-19 NOTE — Progress Notes (Signed)
[facility] [name]  Patient name: [name] [name] MRN: [id number] DOB: 1951-12-14 Sex: female   REASON FOR VISIT:    Follow up  HISOTRY OF PRESENT ILLNESS:    [name] [name] is a 69 y.o. female who is a patient of [name] [name].  He has performed laser ablation for treatment of venous stasis disease and also a right femoral endarterectomy with patch angioplasty on 09/14/2016  Previously, the patient has been treated in [name] with a left common femoral endarterectomy and left superficial femoral artery stent as well as a right superficial femoral artery stent.  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  She has nonhealing wounds on her right leg that are being treated at the wound center.  She also has scabs on her left leg from biopsy sites.  The patient suffers from scleroderma and has undergone several finger amputations.  She is a non-smoker.  She is medically managed for hypertension.   PAST MEDICAL HISTORY:   Past Medical History:  Diagnosis Date  . Anemia   . Arthritis   . DOE (dyspnea on exertion)   . Epistaxis   . History of chicken pox   . Hypertension   . Hypothyroidism   . Leg ulcer ([name])   . Melanoma ([name]) 1999  . 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[name])   . Non Hodgkin's lymphoma ([name])    Treated with chemo 2010, felt to be cured.  Marland Kitchen PAH (pulmonary artery hypertension) ([name])   . Peripheral arterial occlusive disease ([name])   . Pulmonary hypertension ([place])   . Requires supplemental oxygen    2 liters-3liters when moving  . Sclerodermia generalized ([name])      FAMILY HISTORY:   Family History  Problem Relation Age of Onset  . Lupus Father   . Lymphoma Father   . Hyperlipidemia Mother   . Cancer Daughter        sarcoma  . Colon cancer Other     SOCIAL HISTORY:   Social History   Tobacco Use  . Smoking status: Never Smoker  . Smokeless tobacco: Never Used  Substance Use  Topics  . Alcohol use: Yes    Alcohol/week: 0.0 standard drinks    Comment: 1-2 drinks per day wine.liquor or beer     ALLERGIES:   Allergies  Allergen Reactions  . Levofloxacin Other (See Comments)    FLU LIKE SYMPTOM Other reaction(s): Other (See Comments) FLU LIKE SYMPTOM  . Sulfa Antibiotics Rash and Other (See Comments)    Face peeled Severe rash with significant peeling of face. No airway involvement per patient Face peeled  . Levofloxacin In D5w Other (See Comments)    FLU LIKE SYMPTOMS FLU LIKE SYMPTOMS  . Doxycycline Hyclate Swelling    SWELLING REACTION UNSPECIFIED  "TABLETS ONLY - CAPSULES ARE TOLERATED FINE"  . Penicillins Rash    Has patient had a PCN reaction causing immediate rash, facial/tongue/throat swelling, SOB or lightheadedness with hypotension: no Has patient had a PCN reaction causing severe rash involving mucus membranes or skin necrosis: no Has patient had a PCN reaction that required hospitalization: no Has patient had a PCN reaction occurring within the last 10 years: {no If all of the above answers are "NO", then may proceed with Cephalosporin use.     CURRENT MEDICATIONS:   Current Outpatient Medications  Medication Sig Dispense Refill  . acetaminophen (TYLENOL) 500 MG tablet Take 1 tablet (500 mg total) by mouth every 12 (  twelve) hours. 30 tablet 0  . Biotin 5000 MCG TABS Take 5,000 mcg by mouth daily.    Marland Kitchen CALCIUM-MAGNESIUM-VITAMIN D PO Take 2 tablets by mouth daily.    . Cholecalciferol (VITAMIN D) 2000 units CAPS Take 2,000 Units by mouth daily.    . citalopram (CELEXA) 20 MG tablet TAKE 1 TABLET BY MOUTH EVERYDAY AT BEDTIME 90 tablet 3  . clopidogrel (PLAVIX) 75 MG tablet TAKE 1 TABLET BY MOUTH EVERY DAY 90 tablet 3  . FERREX 150 150 MG capsule TAKE 1 CAPSULE BY MOUTH TWICE A DAY 180 capsule 1  . fish oil-omega-3 fatty acids 1000 MG capsule Take 1,000 mg by mouth 2 (two) times daily.     . folic acid (FOLVITE) 1 MG tablet TAKE 1 TABLET  BY MOUTH EVERY DAY 90 tablet 1  . levothyroxine (SYNTHROID) 50 MCG tablet TAKE 1 TABLET BY MOUTH DAILY BEFORE BREAKFAST 90 tablet 1  . loperamide (IMODIUM) 2 MG capsule Take by mouth as needed for diarrhea or loose stools.    . magnesium oxide (MAG-OX) 400 (241.3 Mg) MG tablet Take 0.5 tablets (200 mg total) by mouth 2 (two) times daily. 60 tablet 0  . Multiple Vitamin (MULTIVITAMIN WITH MINERALS) TABS tablet Take 1 tablet by mouth daily.    . OPSUMIT 10 MG tablet Take 1 tablet (62m) by mouth daily. 30 tablet 11  . OXYGEN Inhale 2-3 L into the lungs continuous.     . pantoprazole (PROTONIX) 40 MG tablet Take 1 tablet (40 mg total) by mouth 2 (two) times daily. 60 tablet 1  . potassium chloride 20 MEQ/15ML (10%) SOLN TAKE 2 TEASPOONFUL (DILUTED IN WATER) ONCE DAILY 473 mL 11  . predniSONE (DELTASONE) 5 MG tablet Take 5 mg by mouth daily.    . Probiotic CAPS Take 1 capsule by mouth daily.    . RESTASIS 0.05 % ophthalmic emulsion Place 1 drop into both eyes 2 (two) times daily.    . rosuvastatin (CRESTOR) 5 MG tablet Take 1 tablet every Monday, Wednesday, and Friday. 45 tablet 3  . saccharomyces boulardii (FLORASTOR) 250 MG capsule Take 1 capsule (250 mg total) by mouth 2 (two) times daily. 60 capsule 0  . sildenafil (REVATIO) 20 MG tablet Take 3 tablets (60 mg total) by mouth 3 (three) times daily. Please call for office visit [phone] tablet 1  . spironolactone (ALDACTONE) 25 MG tablet TAKE 1 TABLET BY MOUTH EVERY DAY 90 tablet 0  . torsemide (DEMADEX) 20 MG tablet Take 40 mg by mouth 2 (two) times daily.    . valACYclovir (VALTREX) 500 MG tablet Take 1 tablet (500 mg total) by mouth 2 (two) times daily. 10 tablet 0  . vitamin B-12 (CYANOCOBALAMIN) 1000 MCG tablet Take 1,000 mcg by mouth daily.     No current facility-administered medications for this visit.    REVIEW OF SYSTEMS:   _0  denotes positive finding, _1  denotes negative finding Cardiac  Comments:  Chest pain or chest  pressure:    Shortness of breath upon exertion:    Short of breath when lying flat:    Irregular heart rhythm:        Vascular    Pain in calf, thigh, or hip brought on by ambulation:    Pain in feet at night that wakes you up from your sleep:     Blood clot in your veins:    Leg swelling:         Pulmonary    Oxygen  at home:    Productive cough:     Wheezing:         Neurologic    Sudden weakness in arms or legs:     Sudden numbness in arms or legs:     Sudden onset of difficulty speaking or slurred speech:    Temporary loss of vision in one eye:     Problems with dizziness:         Gastrointestinal    Blood in stool:     Vomited blood:         Genitourinary    Burning when urinating:     Blood in urine:        Psychiatric    Major depression:         Hematologic    Bleeding problems:    Problems with blood clotting too easily:        Skin x   Rashes or ulcers:        Constitutional    Fever or chills:      PHYSICAL EXAM:   Vitals:   09/19/20 1427  BP: (!) 179/67  Pulse: 72  Resp: 20  Temp: 98.6 F (37 C)  SpO2: 91%  Weight: 122 lb (55.3 kg)  Height: 5' 7.5" (1.715 m)    GENERAL: The patient is a well-nourished female, in no acute distress. The vital signs are documented above. CARDIAC: There is a regular rate and rhythm.  VASCULAR: Nonpalpable pedal pulses PULMONARY: Non-labored respirations ABDOMEN: Soft and non-tender with normal pitched bowel sounds.  MUSCULOSKELETAL: There are no major deformities or cyanosis. NEUROLOGIC: No focal weakness or paresthesias are detected. SKIN: See photo below  PSYCHIATRIC: The patient has a normal affect.      STUDIES:   Reviewed the following ultrasound study: ABIs were unreliable due to calcified vessels.  Waveforms bilaterally were monophasic Right toe pressure was 78  MEDICAL ISSUES:   Nonhealing bilateral lower extremity ulcers (right more severe than the left).  She has undergone bilateral  femoral endarterectomy and bilateral superficial femoral artery stenting.  Ultrasound today shows monophasic bilateral lower extremity waveforms.  She has not had angiographic imaging for 4 years.  I discussed proceeding with an arteriogram.  This would be a left femoral access with focus and intervention on the right leg if indicated.  I will try to get this done as soon as possible.  The patient's earliest date is    Leia Alf, MD, FACS [facility] [phone] Pager [phone]

## 2020-09-19 NOTE — Progress Notes (Signed)
[name], [name] ([id number]) Visit Report for 09/12/2020 Arrival Information Details Patient Name: Date of Service: [name] 09/12/2020 12:30 PM Medical Record Number: [id number] Patient Account Number: [id number] Date of Birth/Sex: Treating RN: 1951-12-20 (69 y.o. Orvan Falconer Primary Care Mayari Matus: [name], [place] RY Other Clinician: Referring Coralee Edberg: Treating Jimmie Dattilio/Extender: [name], MA RY Weeks in Treatment: 782 Visit Information History Since Last Visit All ordered tests and consults were completed: No Patient Arrived: Ambulatory Added or deleted any medications: No Arrival Time: 13:30 Any new allergies or adverse reactions: No Accompanied By: husband Had a fall or experienced change in No Transfer Assistance: None activities of daily living that may affect Patient Identification Verified: Yes risk of falls: Secondary Verification Process Completed: Yes Signs or symptoms of abuse/neglect since last visito No Patient Requires Transmission-Based Precautions: No Hospitalized since last visit: No Patient Has Alerts: Yes Implantable device outside of the clinic excluding No Patient Alerts: R ABI: [place] TBI: 0.41 cellular tissue based products placed in the center L ABI: [place] (07/2020) since last visit: Has Dressing in Place as Prescribed: Yes Has Compression in Place as Prescribed: Yes Pain Present Now: No Electronic Signature(s) Signed: 09/14/2020 5:57:30 PM By: Levan Hurst RN, BSN Entered By: Levan Hurst on 09/12/2020 13:59:33 -------------------------------------------------------------------------------- Clinic Level of Care Assessment Details Patient Name: Date of Service: [name], [name] 09/12/2020 12:30 PM Medical Record Number: [id number] Patient Account Number: [id number] Date of Birth/Sex: Treating RN: Sep 06, 1951 (69 y.o. [name] [name] Primary Care Ezzie Senat: [name], [place] R[place] Other Clinician: Referring Dalayah Deahl: Treating  Nikol Lemar/Extender: [name], MA RY Weeks in Treatment: 442 Clinic Level of Care Assessment Items TOOL 4 Quantity Score X- 1 0 Use when only an EandM is performed on FOLLOW-UP visit ASSESSMENTS - Nursing Assessment / Reassessment X- 1 10 Reassessment of Co-morbidities (includes updates in patient status) X- 1 5 Reassessment of Adherence to Treatment Plan ASSESSMENTS - Wound and Skin A ssessment / Reassessment _0  - 0 Simple Wound Assessment / Reassessment - one wound X- 5 5 Complex Wound Assessment / Reassessment - multiple wounds _1  - 0 Dermatologic / Skin Assessment (not related to wound area) ASSESSMENTS - Focused Assessment _2  - 0 Circumferential Edema Measurements - multi extremities _3  - 0 Nutritional Assessment / Counseling / Intervention X- 1 5 Lower Extremity Assessment (monofilament, tuning fork, pulses) _4  - 0 Peripheral Arterial Disease Assessment (using hand held doppler) ASSESSMENTS - Ostomy and/or Continence Assessment and Care _5  - 0 Incontinence Assessment and Management _6  - 0 Ostomy Care Assessment and Management (repouching, etc.) PROCESS - Coordination of Care X - Simple Patient / Family Education for ongoing care 1 15 _7  - 0 Complex (extensive) Patient / Family Education for ongoing care X- 1 10 Staff obtains Programmer, systems, Records, T Results / Process Orders est _8  - 0 Staff telephones HHA, Nursing Homes / Clarify orders / etc _9  - 0 Routine Transfer to another Facility (non-emergent condition) _10  - 0 Routine Hospital Admission (non-emergent condition) _11  - 0 New Admissions / Biomedical engineer / Ordering NPWT Apligraf, etc. , _12  - 0 Emergency Hospital Admission (emergent condition) X- 1 10 Simple Discharge Coordination _13  - 0 Complex (extensive) Discharge Coordination PROCESS - Special Needs _14  - 0 Pediatric / Minor Patient Management _15  - 0 Isolation Patient Management _16  - 0 Hearing / Language / Visual special  needs _17  - 0 Assessment of Community assistance (transportation, D/C planning, etc.) _18  - 0 Additional  assistance / Altered mentation _0  - 0 Support Surface(s) Assessment (bed, cushion, seat, etc.) INTERVENTIONS - Wound Cleansing / Measurement _1  - 0 Simple Wound Cleansing - one wound X- 5 5 Complex Wound Cleansing - multiple wounds X- 1 5 Wound Imaging (photographs - any number of wounds) _2  - 0 Wound Tracing (instead of photographs) _3  - 0 Simple Wound Measurement - one wound X- 5 5 Complex Wound Measurement - multiple wounds INTERVENTIONS - Wound Dressings _4  - 0 Small Wound Dressing one or multiple wounds _5  - 0 Medium Wound Dressing one or multiple wounds X- 2 20 Large Wound Dressing one or multiple wounds <LOVFIEPPIRJJOACZ>_6<\/SAYTKZSWFUXNATFT>_7  - 0 Application of Medications - topical <DUKGURKYHCWCBJSE>_8<\/BTDVVOHYWVPXTGGY>_6  - 0 Application of Medications - injection INTERVENTIONS - Miscellaneous _8  - 0 External ear exam _9  - 0 Specimen Collection (cultures, biopsies, blood, body fluids, etc.) _10  - 0 Specimen(s) / Culture(s) sent or taken to Lab for analysis _11  - 0 Patient Transfer (multiple staff / Civil Service fast streamer / Similar devices) _12  - 0 Simple Staple / Suture removal (25 or less) _13  - 0 Complex Staple / Suture removal (26 or more) _14  - 0 Hypo / Hyperglycemic Management (close monitor of Blood Glucose) _15  - 0 Ankle / Brachial Index (ABI) - do not check if billed separately X- 1 5 Vital Signs Has the patient been seen at the hospital within the last three years: Yes Total Score: 180 Level Of Care: New/Established - Level 5 Electronic Signature(s) Signed: 09/14/2020 5:57:30 PM By: Levan Hurst RN, BSN Entered By: Levan Hurst on 09/12/2020 17:07:00 -------------------------------------------------------------------------------- Encounter Discharge Information Details Patient Name: Date of Service: [name] 09/12/2020 12:30 PM Medical Record Number: [id number] Patient Account Number: [id number] Date of Birth/Sex:  Treating RN: 09/10/1951 (69 y.o. [name] Primary Care Makayla Confer: [name], [place] R[place] Other Clinician: Referring Tana Trefry: Treating Axelle Szwed/Extender: [name], MA RY Weeks in Treatment: [phone] Encounter Discharge Information Items Discharge Condition: Stable Ambulatory Status: Ambulatory Discharge Destination: Home Transportation: Private Auto Accompanied By: husband Schedule Follow-up Appointment: Yes Clinical Summary of Care: Electronic Signature(s) Signed: 09/12/2020 5:15:56 PM By: Deon Pilling Entered By: Deon Pilling on 09/12/2020 14:20:32 -------------------------------------------------------------------------------- Lower Extremity Assessment Details Patient Name: Date of Service: [name] 09/12/2020 12:30 PM Medical Record Number: [id number] Patient Account Number: [id number] Date of Birth/Sex: Treating RN: 09-29-1951 (69 y.o. Orvan Falconer Primary Care Donterrius Santucci: [name], [place] R[place] Other Clinician: Referring Jayleon Mcfarlane: Treating Jaileigh Weimer/Extender: [name], MA RY Weeks in Treatment: 442 Edema Assessment Assessed: [Left: No] [Right: No] Edema: [Left: Yes] [Right: Yes] Calf Left: Right: Point of Measurement: 36 cm From Medial Instep 34.5 cm 29 cm Ankle Left: Right: Point of Measurement: 10 cm From Medial Instep 21.5 cm 21.5 cm Electronic Signature(s) Signed: 09/19/2020 1:16:40 PM By: Carlene Coria RN Entered By: Carlene Coria on 09/12/2020 13:31:46 -------------------------------------------------------------------------------- Multi Wound Chart Details Patient Name: Date of Service: [name] 09/12/2020 12:30 PM Medical Record Number: [id number] Patient Account Number: [id number] Date of Birth/Sex: Treating RN: [birth year] (69 y.o. [name] [name] Primary Care Qamar Aughenbaugh: [name], [place] R[place] Other Clinician: Referring Vernel Donlan: Treating Pavle Wiler/Extender: [name], MA RY Weeks in Treatment: 442 Vital  Signs Height(in): 68 Pulse(bpm): 88 Weight(lbs): 132 Blood Pressure(mmHg): 172/74 Body Mass Index(BMI): 20 Temperature(F): 97.9 Respiratory Rate(breaths/min): 18 Photos: [16:No Photos Right Calcaneus] [19:No Photos Right, Lateral Malleolus] [20:No Photos Left, Lateral Malleolus] Wound Location: [16:Pressure Injury] [19:Gradually Appeared] [20:Gradually Appeared]  Wounding Event: [16:Pressure Ulcer] [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] Primary Etiology: [16:Anemia, Hypertension, Peripheral] [19:Anemia, Hypertension, Peripheral] [20:Anemia, Hypertension, Peripheral] Comorbid History: [16:Arterial Disease, Peripheral Venous Arterial Disease, Peripheral Venous Arterial Disease, Peripheral Venous Disease, Raynauds, Scleroderma, Rheumatoid Arthritis, Osteoarthritis Rheumatoid Arthritis, Osteoarthritis Rheumatoid  Arthritis, Osteoarthritis 01/04/2020] [19:Disease, Raynauds, Scleroderma, Disease, Raynauds, Scleroderma, 05/02/2020] [20:05/02/2020] Date Acquired: [16:36] [19:19] [20:19] Weeks of Treatment: [16:Open] [19:Open] [20:Open] Wound Status: [16:No] [19:No] [20:No] Clustered Wound: [16:1.5x0.6x1.1] [19:0.3x0.2x0.2] [20:0.7x0.7x0.7] Measurements L x W x D (cm) [81:2.751] [19:0.047] [20:0.385] A (cm) : rea [16:0.778] [19:0.009] [20:0.269] Volume (cm) : [16:94.40%] [19:66.70%] [20:12.50%] % Reduction in Area: [16:38.10%] [19:67.90%] [20:-205.70%] % Reduction in Volume: [16:Unstageable/Unclassified] [19:Full Thickness Without Exposed] [20:Full Thickness Without Exposed] Classification: [16:Small] [19:Support Structures Small] [20:Support Structures Small] Exudate Amount: [16:Serosanguineous] [19:Serous] [20:Purulent] Exudate Type: [16:red, brown] [19:amber] [20:yellow, brown, green] Exudate Color: [16:Flat and Intact] [19:Flat and Intact] [20:Well defined, not attached] Wound Margin: [16:Small (1-33%)] [19:None Present (0%)] [20:Small (1-33%)] Granulation Amount: [16:Pink]  [19:N/A] [20:Pink] Granulation Quality: [16:Large (67-100%)] [19:Large (67-100%)] [20:Large (67-100%)] Necrotic Amount: [16:Fat Layer (Subcutaneous Tissue): Yes Fat Layer (Subcutaneous Tissue): Yes Fat Layer (Subcutaneous Tissue): Yes] Exposed Structures: [16:Fascia: No Tendon: No Muscle: No Joint: No Bone: No None] [19:Fascia: No Tendon: No Muscle: No Joint: No Bone: No None] [20:Fascia: No Tendon: No Muscle: No Joint: No Bone: No None] Wound Number: [id number] N/A Photos: No Photos No Photos N/A Right, Posterior Lower Leg Right, Medial Lower Leg N/A Wound Location: Gradually Appeared Gradually Appeared N/A Wounding Event: Venous Leg Ulcer Venous Leg Ulcer N/A Primary Etiology: Anemia, Hypertension, Peripheral N/A N/A Comorbid History: Arterial Disease, Peripheral Venous Disease, Raynauds, Scleroderma, Rheumatoid Arthritis, Osteoarthritis 08/15/2020 01/13/2013 N/A Date Acquired: 4 398 N/A Weeks of Treatment: Open Open N/A Wound Status: No Yes N/A Clustered Wound: 1.2x0.5x0.1 2x2.3x0.4 N/A Measurements L x W x D (cm) 0.471 3.613 N/A A (cm) : rea 0.047 1.445 N/A Volume (cm) : 58.10% -155.50% N/A % Reduction in Area: 58.00% -924.80% N/A % Reduction in Volume: Unclassifiable Full Thickness With Exposed Support N/A Classification: Structures Small N/A N/A Exudate Amount: Purulent N/A N/A Exudate Type: yellow, brown, green N/A N/A Exudate Color: Distinct, outline attached N/A N/A Wound Margin: None Present (0%) N/A N/A Granulation Amount: N/A N/A N/A Granulation Quality: Large (67-100%) N/A N/A Necrotic Amount: Fat Layer (Subcutaneous Tissue): Yes N/A N/A Exposed Structures: Fascia: No Tendon: No Muscle: No Joint: No Bone: No None N/A N/A Epithelialization: Treatment Notes Electronic Signature(s) Signed: 09/13/2020 5:12:45 PM By: Linton Ham MD Signed: 09/14/2020 5:57:30 PM By: Levan Hurst RN, BSN Entered By: Linton Ham on 09/12/2020  14:11:37 -------------------------------------------------------------------------------- Multi-Disciplinary Care Plan Details Patient Name: Date of Service: [name], [name] 09/12/2020 12:30 PM Medical Record Number: [id number] Patient Account Number: [id number] Date of Birth/Sex: Treating RN: 03-Jun-1951 (69 y.o. [name] [name] Primary Care Aleina Burgio: [name], [place] RY Other Clinician: Referring Akari Defelice: Treating Markeda Narvaez/Extender: [name], MA RY Weeks in Treatment: [phone] Active Inactive Wound/Skin Impairment Nursing Diagnoses: Impaired tissue integrity Knowledge deficit related to ulceration/compromised skin integrity Goals: Patient/caregiver will verbalize understanding of skin care regimen Date Initiated: 10/11/2016 Target Resolution Date: 10/14/2020 Goal Status: Active Interventions: Assess patient/caregiver ability to obtain necessary supplies Assess patient/caregiver ability to perform ulcer/skin care regimen upon admission and as needed Assess ulceration(s) every visit Provide education on ulcer and skin care Treatment Activities: Skin care regimen initiated : 10/11/2016 Topical wound management initiated : 10/11/2016 Notes: Electronic Signature(s) Signed: 09/14/2020 5:57:30 PM By: Levan Hurst RN, BSN  Entered By: Levan Hurst on 09/12/2020 13:40:08 -------------------------------------------------------------------------------- Pain Assessment Details Patient Name: Date of Service: [name]. 09/12/2020 12:30 PM Medical Record Number: [id number] Patient Account Number: [id number] Date of Birth/Sex: Treating RN: 01-May-1951 (69 y.o. Orvan Falconer Primary Care Siboney Requejo: [name], [place] R[place] Other Clinician: Referring Yenni Carra: Treating Udell Blasingame/Extender: [name], MA RY Weeks in Treatment: [phone] Active Problems Location of Pain Severity and Description of Pain Patient Has Paino No Site Locations Pain Management and  Medication Current Pain Management: Electronic Signature(s) Signed: 09/19/2020 1:16:40 PM By: Carlene Coria RN Entered By: Carlene Coria on 09/12/2020 13:31:27 -------------------------------------------------------------------------------- Patient/Caregiver Education Details Patient Name: Date of Service: [name] 9/13/2021andnbsp12:30 PM Medical Record Number: [id number] Patient Account Number: [id number] Date of Birth/Gender: Treating RN: 09-01-1951 (69 y.o. [name] [name] Primary Care Physician: [name], [place] RY Other Clinician: Referring Physician: Treating Physician/Extender: [name], MA RY Weeks in Treatment: [phone] Education Assessment Education Provided To: Patient Education Topics Provided Wound/Skin Impairment: Methods: Explain/Verbal Responses: State content correctly Electronic Signature(s) Signed: 09/14/2020 5:57:30 PM By: Levan Hurst RN, BSN Entered By: Levan Hurst on 09/12/2020 13:40:25 -------------------------------------------------------------------------------- Wound Assessment Details Patient Name: Date of Service: [name] 09/12/2020 12:30 PM Medical Record Number: [id number] Patient Account Number: [id number] Date of Birth/Sex: Treating RN: 1951-06-11 (69 y.o. Orvan Falconer Primary Care Chamaine Stankus: [name], [place] R[place] Other Clinician: Referring Ezrael Sam: Treating Lyrica Mcclarty/Extender: [name], MA RY Weeks in Treatment: [place] Wound Status Wound Number: 16 Primary Pressure Ulcer Etiology: Wound Location: Right Calcaneus Wound Open Wounding Event: Pressure Injury Status: Date Acquired: 01/04/2020 Comorbid Anemia, Hypertension, Peripheral Arterial Disease, Peripheral Weeks Of Treatment: 36 History: Venous Disease, Raynauds, Scleroderma, Rheumatoid Arthritis, Clustered Wound: No Osteoarthritis Photos Photo Uploaded By: Mikeal Hawthorne on 09/14/2020 14:23:13 Wound Measurements Length: (cm) 1.5 Width:  (cm) 0.6 Depth: (cm) 1.1 Area: (cm) 0.707 Volume: (cm) 0.778 % Reduction in Area: 94.4% % Reduction in Volume: 38.1% Epithelialization: None Tunneling: No Undermining: No Wound Description Classification: Unstageable/Unclassified Wound Margin: Flat and Intact Exudate Amount: Small Exudate Type: Serosanguineous Exudate Color: red, brown Foul Odor After Cleansing: No Slough/Fibrino Yes Wound Bed Granulation Amount: Small (1-33%) Exposed Structure Granulation Quality: Pink Fascia Exposed: No Necrotic Amount: Large (67-100%) Fat Layer (Subcutaneous Tissue) Exposed: Yes Necrotic Quality: Adherent Slough Tendon Exposed: No Muscle Exposed: No Joint Exposed: No Bone Exposed: No Treatment Notes Wound #16 (Right Calcaneus) 1. Cleanse With Wound Cleanser Soap and water 2. Periwound Care Moisturizing lotion 3. Primary Dressing Applied Hydrogel or K-Y Jelly Other primary dressing (specifiy in notes) 4. Secondary Dressing Dry Gauze 6. Support Layer Holiday representative) Signed: 09/19/2020 1:16:40 PM By: Carlene Coria RN Entered By: Carlene Coria on 09/12/2020 13:32:13 -------------------------------------------------------------------------------- Wound Assessment Details Patient Name: Date of Service: [name] 09/12/2020 12:30 PM Medical Record Number: [id number] Patient Account Number: [id number] Date of Birth/Sex: Treating RN: [birth year] (69 y.o. Orvan Falconer Primary Care Xian Alves: [name], [place] R[place] Other Clinician: Referring Takiya Belmares: Treating Craig Ionescu/Extender: [name], MA RY Weeks in Treatment: 442 Wound Status Wound Number: 19 Primary Venous Leg Ulcer Etiology: Wound Location: Right, Lateral Malleolus Wound Open Wounding Event: Gradually Appeared Status: Date Acquired: 05/02/2020 Comorbid Anemia, Hypertension, Peripheral Arterial Disease, Peripheral Weeks Of Treatment: 19 History: Venous Disease, Raynauds,  Scleroderma, Rheumatoid Arthritis, Clustered Wound: No Osteoarthritis Photos Photo Uploaded By: Mikeal Hawthorne on 09/14/2020 14:23:41 Wound Measurements Length: (  cm) 0.3 Width: (cm) 0.2 Depth: (cm) 0.2 Area: (cm) 0.047 Volume: (cm) 0.009 % Reduction in Area: 66.7% % Reduction in Volume: 67.9% Epithelialization: None Tunneling: No Undermining: No Wound Description Classification: Full Thickness Without Exposed Support Structures Wound Margin: Flat and Intact Exudate Amount: Small Exudate Type: Serous Exudate Color: amber Foul Odor After Cleansing: No Slough/Fibrino Yes Wound Bed Granulation Amount: None Present (0%) Exposed Structure Necrotic Amount: Large (67-100%) Fascia Exposed: No Necrotic Quality: Adherent Slough Fat Layer (Subcutaneous Tissue) Exposed: Yes Tendon Exposed: No Muscle Exposed: No Joint Exposed: No Bone Exposed: No Treatment Notes Wound #19 (Right, Lateral Malleolus) 1. Cleanse With Wound Cleanser Soap and water 2. Periwound Care Moisturizing lotion 3. Primary Dressing Applied Hydrogel or K-Y Jelly Other primary dressing (specifiy in notes) 4. Secondary Dressing Dry Gauze 6. Support Layer Holiday representative) Signed: 09/19/2020 1:16:40 PM By: Carlene Coria RN Entered By: Carlene Coria on 09/12/2020 13:32:46 -------------------------------------------------------------------------------- Wound Assessment Details Patient Name: Date of Service: [name] 09/12/2020 12:30 PM Medical Record Number: [id number] Patient Account Number: [id number] Date of Birth/Sex: Treating RN: 1951-02-22 (69 y.o. 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, Lateral Malleolus Wound Open Wounding  Event: Gradually Appeared Status: Date Acquired: 05/02/2020 Comorbid Anemia, Hypertension, Peripheral Arterial Disease, Peripheral Weeks Of Treatment: 19 History: Venous Disease, Raynauds, Scleroderma, Rheumatoid Arthritis, Clustered Wound: No Osteoarthritis Photos Photo Uploaded By: Mikeal Hawthorne on 09/14/2020 14:23:14 Wound Measurements Length: (cm) 0.7 Width: (cm) 0.7 Depth: (cm) 0.7 Area: (cm) 0.385 Volume: (cm) 0.269 % Reduction in Area: 12.5% % Reduction in Volume: -205.7% Epithelialization: None Tunneling: No Undermining: No Wound Description Classification: Full Thickness Without Exposed Support Structures Wound Margin: Well defined, not attached Exudate Amount: Small Exudate Type: Purulent Exudate Color: yellow, brown, green Foul Odor After Cleansing: No Slough/Fibrino Yes Wound Bed Granulation Amount: Small (1-33%) Exposed Structure Granulation Quality: Pink Fascia Exposed: No Necrotic Amount: Large (67-100%) Fat Layer (Subcutaneous Tissue) Exposed: Yes Necrotic Quality: Adherent Slough Tendon Exposed: No Muscle Exposed: No Joint Exposed: No Bone Exposed: No Treatment Notes Wound #20 (Left, Lateral Malleolus) 1. Cleanse With Wound Cleanser Soap and water 2. Periwound Care Moisturizing lotion 3. Primary Dressing Applied Santyl 4. Secondary Dressing Dry Gauze 6. Support Layer Applied Kerlix/Coban Notes santyl to left lateral malleolus. Electronic Signature(s) Signed: 09/19/2020 1:16:40 PM By: Carlene Coria RN Entered By: Carlene Coria on 09/12/2020 13:33:14 -------------------------------------------------------------------------------- Wound Assessment Details Patient Name: Date of Service: [name] 09/12/2020 12:30 PM Medical Record Number: [id number] Patient Account Number: [id number] Date of Birth/Sex: Treating RN: 02/16/1951 (69 y.o. Orvan Falconer Primary Care Eric Morganti: [name], [place] R[place] Other Clinician: Referring Adilyn Humes: Treating  Fleetwood Pierron/Extender: [name], MA RY Weeks in Treatment: 442 Wound Status Wound Number: 23 Primary Venous Leg Ulcer Etiology: Wound Location: Right, Posterior Lower Leg Wound Open Wounding Event: Gradually Appeared Status: Date Acquired: 08/15/2020 Comorbid Anemia, Hypertension, Peripheral Arterial Disease, Peripheral Weeks Of Treatment: 4 History: Venous Disease, Raynauds, Scleroderma, Rheumatoid Arthritis, Clustered Wound: No Osteoarthritis Photos Photo Uploaded By: Mikeal Hawthorne on 09/14/2020 14:23:43 Wound Measurements Length: (cm) 1.2 Width: (cm) 0.5 Depth: (cm) 0.1 Area: (cm) 0.471 Volume: (cm) 0.047 % Reduction in Area: 58.1% % Reduction in Volume: 58% Epithelialization: None Tunneling: No Undermining: No Wound Description Classification: Unclassifiable Wound Margin: Distinct,  outline attached Exudate Amount: Small Exudate Type: Purulent Exudate Color: yellow, brown, green Foul Odor After Cleansing: No Slough/Fibrino Yes Wound Bed Granulation Amount: None Present (0%) Exposed Structure Necrotic Amount: Large (67-100%) Fascia Exposed: No Necrotic Quality: Adherent Slough Fat Layer (Subcutaneous Tissue) Exposed: Yes Tendon Exposed: No Muscle Exposed: No Joint Exposed: No Bone Exposed: No Treatment Notes Wound #23 (Right, Posterior Lower Leg) 1. Cleanse With Wound Cleanser Soap and water 2. Periwound Care Moisturizing lotion 3. Primary Dressing Applied Hydrogel or K-Y Jelly Other primary dressing (specifiy in notes) 4. Secondary Dressing Dry Gauze 6. Support Layer Holiday representative) Signed: 09/19/2020 1:16:40 PM By: Carlene Coria RN Entered By: Carlene Coria on 09/12/2020 13:33:41 -------------------------------------------------------------------------------- Wound Assessment Details Patient Name: Date of Service: [name] 09/12/2020 12:30 PM Medical Record Number: [id number] Patient Account  Number: [id number] Date of Birth/Sex: Treating RN: [birth year] (69 y.o. Orvan Falconer Primary Care Adriell Polansky: [name], [place] R[place] Other Clinician: Referring Eri Mcevers: Treating Carmela Piechowski/Extender: [name], MA RY Weeks in Treatment: 442 Wound Status Wound Number: 5 Primary Etiology: Venous Leg Ulcer Wound Location: Right, Medial Lower Leg Wound Status: Open Wounding Event: Gradually Appeared Date Acquired: 01/13/2013 Weeks Of Treatment: 398 Clustered Wound: Yes Photos Photo Uploaded By: Mikeal Hawthorne on 09/14/2020 14:23:58 Wound Measurements Length: (cm) 2 Width: (cm) 2.3 Depth: (cm) 0.4 Area: (cm) 3.613 Volume: (cm) 1.445 % Reduction in Area: -155.5% % Reduction in Volume: -924.8% Wound Description Classification: Full Thickness With Exposed Support Structures Treatment Notes Wound #5 (Right, Medial Lower Leg) 1. Cleanse With Wound Cleanser Soap and water 2. Periwound Care Moisturizing lotion 3. Primary Dressing Applied Hydrogel or K-Y Jelly Other primary dressing (specifiy in notes) 4. Secondary Dressing Dry Gauze 6. Support Layer Holiday representative) Signed: 09/19/2020 1:16:40 PM By: Carlene Coria RN Entered By: Carlene Coria on 09/12/2020 13:28:33 -------------------------------------------------------------------------------- Vitals Details Patient Name: Date of Service: [name], [name] 09/12/2020 12:30 PM Medical Record Number: [id number] Patient Account Number: [id number] Date of Birth/Sex: Treating RN: 02/03/1951 (69 y.o. Orvan Falconer Primary Care Kenshawn Maciolek: [name], [place] RY Other Clinician: Referring [place] Capers: Treating Brae Schaafsma/Extender: [name], MA RY Weeks in Treatment: 442 Vital Signs Time Taken: 13:30 Temperature (F): 97.9 Height (in): 68 Pulse (bpm): 88 Weight (lbs): 132 Respiratory Rate (breaths/min): 18 Body Mass Index (BMI): 20.1 Blood Pressure (mmHg): 172/74 Reference Range: 80 -  120 mg / dl Electronic Signature(s) Signed: 09/19/2020 1:16:40 PM By: Carlene Coria RN Entered By: Carlene Coria on 09/12/2020 13:31:17

## 2020-09-21 DIAGNOSIS — Z08 Encounter for follow-up examination after completed treatment for malignant neoplasm: Secondary | ICD-10-CM | POA: Diagnosis not present

## 2020-09-21 DIAGNOSIS — Z85828 Personal history of other malignant neoplasm of skin: Secondary | ICD-10-CM | POA: Diagnosis not present

## 2020-09-21 DIAGNOSIS — L570 Actinic keratosis: Secondary | ICD-10-CM | POA: Diagnosis not present

## 2020-09-21 DIAGNOSIS — X32XXXD Exposure to sunlight, subsequent encounter: Secondary | ICD-10-CM | POA: Diagnosis not present

## 2020-09-22 DIAGNOSIS — Z9622 Myringotomy tube(s) status: Secondary | ICD-10-CM | POA: Diagnosis not present

## 2020-09-22 DIAGNOSIS — H906 Mixed conductive and sensorineural hearing loss, bilateral: Secondary | ICD-10-CM | POA: Diagnosis not present

## 2020-09-22 DIAGNOSIS — H6983 Other specified disorders of Eustachian tube, bilateral: Secondary | ICD-10-CM | POA: Diagnosis not present

## 2020-09-29 ENCOUNTER — Telehealth: Payer: Self-pay

## 2020-09-29 NOTE — Telephone Encounter (Signed)
Patient scheduled for aortogram with Brabham on 10/5. Patient had questions regarding NPO status and taking am medications. Instructed to take all normal medications with a sip of water- including Plavix - no hold indicated for PV lab case. Told her she could hold vitamins if she wanted, but discussed importance of taking other am medications esp for BP. Patient verbalized understanding; instructed to call back with any other questions.

## 2020-09-30 NOTE — Telephone Encounter (Signed)
Opened in error

## 2020-10-03 ENCOUNTER — Other Ambulatory Visit (HOSPITAL_COMMUNITY)
Admission: RE | Admit: 2020-10-03 | Discharge: 2020-10-03 | Disposition: A | Discharge status: Home / Self Care | Payer: Medicare Other | Source: Ambulatory Visit | Attending: Surgery | Admitting: Surgery

## 2020-10-03 ENCOUNTER — Encounter (HOSPITAL_BASED_OUTPATIENT_CLINIC_OR_DEPARTMENT_OTHER): Payer: Medicare Other | Admitting: Internal Medicine

## 2020-10-03 DIAGNOSIS — Z01812 Encounter for preprocedural laboratory examination: Secondary | ICD-10-CM | POA: Insufficient documentation

## 2020-10-03 DIAGNOSIS — Z20822 Contact with and (suspected) exposure to covid-19: Secondary | ICD-10-CM | POA: Insufficient documentation

## 2020-10-03 LAB — SARS CORONAVIRUS 2 (TAT 6-24 HRS): SARS Coronavirus 2: NEGATIVE

## 2020-10-04 ENCOUNTER — Encounter (HOSPITAL_COMMUNITY)
Admission: RE | Disposition: A | Discharge status: Home / Self Care | Payer: Self-pay | Source: Home / Self Care | Attending: Surgery

## 2020-10-04 ENCOUNTER — Other Ambulatory Visit: Payer: Self-pay

## 2020-10-04 ENCOUNTER — Ambulatory Visit (HOSPITAL_COMMUNITY)
Admission: RE | Admit: 2020-10-04 | Discharge: 2020-10-04 | Disposition: A | Discharge status: Home / Self Care | Payer: Medicare Other | Attending: Surgery | Admitting: Surgery

## 2020-10-04 DIAGNOSIS — Z880 Allergy status to penicillin: Secondary | ICD-10-CM | POA: Insufficient documentation

## 2020-10-04 DIAGNOSIS — E039 Hypothyroidism, unspecified: Secondary | ICD-10-CM | POA: Diagnosis not present

## 2020-10-04 DIAGNOSIS — Z79899 Other long term (current) drug therapy: Secondary | ICD-10-CM | POA: Diagnosis not present

## 2020-10-04 DIAGNOSIS — I70213 Atherosclerosis of native arteries of extremities with intermittent claudication, bilateral legs: Secondary | ICD-10-CM | POA: Insufficient documentation

## 2020-10-04 DIAGNOSIS — I10 Essential (primary) hypertension: Secondary | ICD-10-CM | POA: Diagnosis not present

## 2020-10-04 DIAGNOSIS — I70242 Atherosclerosis of native arteries of left leg with ulceration of calf: Secondary | ICD-10-CM | POA: Diagnosis not present

## 2020-10-04 DIAGNOSIS — Z7902 Long term (current) use of antithrombotics/antiplatelets: Secondary | ICD-10-CM | POA: Diagnosis not present

## 2020-10-04 DIAGNOSIS — I2721 Secondary pulmonary arterial hypertension: Secondary | ICD-10-CM | POA: Insufficient documentation

## 2020-10-04 DIAGNOSIS — M349 Systemic sclerosis, unspecified: Secondary | ICD-10-CM | POA: Diagnosis not present

## 2020-10-04 DIAGNOSIS — Z79890 Hormone replacement therapy (postmenopausal): Secondary | ICD-10-CM | POA: Insufficient documentation

## 2020-10-04 DIAGNOSIS — Z9981 Dependence on supplemental oxygen: Secondary | ICD-10-CM | POA: Diagnosis not present

## 2020-10-04 DIAGNOSIS — L97919 Non-pressure chronic ulcer of unspecified part of right lower leg with unspecified severity: Secondary | ICD-10-CM | POA: Diagnosis not present

## 2020-10-04 DIAGNOSIS — L97929 Non-pressure chronic ulcer of unspecified part of left lower leg with unspecified severity: Secondary | ICD-10-CM | POA: Diagnosis not present

## 2020-10-04 DIAGNOSIS — I70239 Atherosclerosis of native arteries of right leg with ulceration of unspecified site: Secondary | ICD-10-CM | POA: Diagnosis not present

## 2020-10-04 DIAGNOSIS — R0609 Other forms of dyspnea: Secondary | ICD-10-CM | POA: Diagnosis not present

## 2020-10-04 DIAGNOSIS — I70232 Atherosclerosis of native arteries of right leg with ulceration of calf: Secondary | ICD-10-CM | POA: Diagnosis not present

## 2020-10-04 DIAGNOSIS — Z882 Allergy status to sulfonamides status: Secondary | ICD-10-CM | POA: Diagnosis not present

## 2020-10-04 DIAGNOSIS — I70249 Atherosclerosis of native arteries of left leg with ulceration of unspecified site: Secondary | ICD-10-CM | POA: Diagnosis not present

## 2020-10-04 DIAGNOSIS — D649 Anemia, unspecified: Secondary | ICD-10-CM | POA: Insufficient documentation

## 2020-10-04 HISTORY — PX: ABDOMINAL AORTOGRAM W/LOWER EXTREMITY: CATH118223

## 2020-10-04 HISTORY — PX: PERIPHERAL VASCULAR BALLOON ANGIOPLASTY: CATH118281

## 2020-10-04 LAB — POCT ACTIVATED CLOTTING TIME
Activated Clotting Time: 219 seconds
Activated Clotting Time: 230 seconds
Activated Clotting Time: 202 seconds
Activated Clotting Time: 175 seconds

## 2020-10-04 LAB — POCT I-STAT, CHEM 8
BUN: 35 mg/dL — ABNORMAL HIGH (ref 8–23)
Calcium, Ion: 1.14 mmol/L — ABNORMAL LOW (ref 1.15–1.40)
Chloride: 95 mmol/L — ABNORMAL LOW (ref 98–111)
Creatinine, Ser: 1.5 mg/dL — ABNORMAL HIGH (ref 0.44–1.00)
Glucose, Bld: 91 mg/dL (ref 70–99)
HCT: 30 % — ABNORMAL LOW (ref 36.0–46.0)
Hemoglobin: 10.2 g/dL — ABNORMAL LOW (ref 12.0–15.0)
Potassium: 3.8 mmol/L (ref 3.5–5.1)
Sodium: 135 mmol/L (ref 135–145)
TCO2: 28 mmol/L (ref 22–32)

## 2020-10-04 SURGERY — ABDOMINAL AORTOGRAM W/LOWER EXTREMITY
Anesthesia: LOCAL | Laterality: Right

## 2020-10-04 MED ORDER — ONDANSETRON HCL 4 MG/2ML IJ SOLN: 4.0000 mg | Freq: Four times a day (QID) | INTRAMUSCULAR | Status: DC | PRN

## 2020-10-04 MED ORDER — SODIUM CHLORIDE 0.9% FLUSH: 3.0000 mL | Freq: Two times a day (BID) | INTRAVENOUS | Status: DC

## 2020-10-04 MED ORDER — MIDAZOLAM HCL 2 MG/2ML IJ SOLN
INTRAMUSCULAR | Status: AC
  Filled 2020-10-04: qty 2

## 2020-10-04 MED ORDER — LABETALOL HCL 5 MG/ML IV SOLN: 10.0000 mg | INTRAVENOUS | Status: DC | PRN

## 2020-10-04 MED ORDER — SODIUM CHLORIDE 0.9 % IV SOLN: INTRAVENOUS | Status: DC

## 2020-10-04 MED ORDER — MORPHINE SULFATE (PF) 2 MG/ML IV SOLN: 2.0000 mg | INTRAVENOUS | Status: DC | PRN

## 2020-10-04 MED ORDER — SODIUM CHLORIDE 0.9 % WEIGHT BASED INFUSION: 1.0000 mL/kg/h | INTRAVENOUS | Status: DC

## 2020-10-04 MED ORDER — HEPARIN (PORCINE) IN NACL 1000-0.9 UT/500ML-% IV SOLN
INTRAVENOUS | Status: AC
  Filled 2020-10-04: qty 1000

## 2020-10-04 MED ORDER — LIDOCAINE HCL (PF) 1 % IJ SOLN
INTRAMUSCULAR | Status: AC
  Filled 2020-10-04: qty 30

## 2020-10-04 MED ORDER — OXYCODONE HCL 5 MG PO TABS: 5.0000 mg | ORAL_TABLET | ORAL | Status: DC | PRN

## 2020-10-04 MED ORDER — FENTANYL CITRATE (PF) 100 MCG/2ML IJ SOLN
INTRAMUSCULAR | Status: AC
  Filled 2020-10-04: qty 2

## 2020-10-04 MED ORDER — HYDRALAZINE HCL 20 MG/ML IJ SOLN: 5.0000 mg | INTRAMUSCULAR | Status: DC | PRN

## 2020-10-04 MED ORDER — FENTANYL CITRATE (PF) 100 MCG/2ML IJ SOLN
INTRAMUSCULAR | Status: DC | PRN
Start: 2020-10-04 — End: 2020-10-04
  Administered 2020-10-04: 25 ug via INTRAVENOUS
  Administered 2020-10-04 (×2): 50 ug via INTRAVENOUS

## 2020-10-04 MED ORDER — HEPARIN (PORCINE) IN NACL 1000-0.9 UT/500ML-% IV SOLN
INTRAVENOUS | Status: DC | PRN
  Administered 2020-10-04 (×2): 500 mL

## 2020-10-04 MED ORDER — MIDAZOLAM HCL 2 MG/2ML IJ SOLN
INTRAMUSCULAR | Status: DC | PRN
  Administered 2020-10-04 (×4): 1 mg via INTRAVENOUS

## 2020-10-04 MED ORDER — SODIUM CHLORIDE 0.9% FLUSH: 3.0000 mL | INTRAVENOUS | Status: DC | PRN

## 2020-10-04 MED ORDER — HEPARIN SODIUM (PORCINE) 1000 UNIT/ML IJ SOLN
INTRAMUSCULAR | Status: AC
  Filled 2020-10-04: qty 1

## 2020-10-04 MED ORDER — LIDOCAINE HCL (PF) 1 % IJ SOLN
INTRAMUSCULAR | Status: DC | PRN
  Administered 2020-10-04: 12 mL

## 2020-10-04 MED ORDER — ACETAMINOPHEN 325 MG PO TABS
650.0000 mg | ORAL_TABLET | ORAL | Status: DC | PRN
  Administered 2020-10-04: 650 mg via ORAL
  Filled 2020-10-04: qty 2

## 2020-10-04 MED ORDER — IODIXANOL 320 MG/ML IV SOLN
INTRAVENOUS | Status: DC | PRN
  Administered 2020-10-04: 145 mL

## 2020-10-04 MED ORDER — SODIUM CHLORIDE 0.9 % IV SOLN: 250.0000 mL | INTRAVENOUS | Status: DC | PRN

## 2020-10-04 MED ORDER — HEPARIN SODIUM (PORCINE) 1000 UNIT/ML IJ SOLN
INTRAMUSCULAR | Status: DC | PRN
  Administered 2020-10-04: 7000 [IU] via INTRAVENOUS
  Administered 2020-10-04: 1000 [IU] via INTRAVENOUS

## 2020-10-04 SURGICAL SUPPLY — 22 items
BAG SNAP BAND KOVER 36X36 (MISCELLANEOUS) ×3 IMPLANT
BALLN STERLING OTW 3X40X150 (BALLOONS) ×3
BALLN STERLING OTW 3X60X150 (BALLOONS) ×3
BALLOON STERLING OTW 3X40X150 (BALLOONS) ×2 IMPLANT
BALLOON STERLING OTW 3X60X150 (BALLOONS) ×2 IMPLANT
CATH OMNI FLUSH 5F 65CM (CATHETERS) ×3 IMPLANT
COVER DOME SNAP 22 D (MISCELLANEOUS) ×3 IMPLANT
DCB RANGER 4.0X150 150 (BALLOONS) ×2 IMPLANT
DEVICE CONTINUOUS FLUSH (MISCELLANEOUS) ×3 IMPLANT
KIT ENCORE 26 ADVANTAGE (KITS) ×3 IMPLANT
KIT MICROPUNCTURE NIT STIFF (SHEATH) ×3 IMPLANT
KIT PV (KITS) ×3 IMPLANT
RANGER DCB 4.0X150 150 (BALLOONS) ×3
SHEATH PINNACLE 5F 10CM (SHEATH) ×3 IMPLANT
SHEATH PINNACLE ST 6F 45CM (SHEATH) ×3 IMPLANT
SHEATH PROBE COVER 6X72 (BAG) ×3 IMPLANT
SHIELD RADPAD SCOOP 12X17 (MISCELLANEOUS) ×3 IMPLANT
SYR MEDRAD MARK V 150ML (SYRINGE) ×3 IMPLANT
TRANSDUCER W/STOPCOCK (MISCELLANEOUS) ×3 IMPLANT
TRAY PV CATH (CUSTOM PROCEDURE TRAY) ×3 IMPLANT
WIRE G V18X300CM (WIRE) ×6 IMPLANT
WIRE STARTER BENTSON 035X150 (WIRE) ×3 IMPLANT

## 2020-10-04 NOTE — Op Note (Signed)
Patient name: RILEIGH KAWASHIMA MRN: [id number] DOB: 1951-09-13 Sex: female  10/04/2020 Pre-operative Diagnosis: Bilateral lower extremity ulcers Post-operative diagnosis:  Same Surgeon:  Annamarie Major Procedure Performed:  1.  Ultrasound-guided access, left femoral artery  2.  Abdominal aortogram  3.  Bilateral lower extremity runoff  4.  Angioplasty, right anterior tibial artery  5.  Angioplasty, right tibioperoneal trunk/peroneal artery  6.  Drug-coated balloon angioplasty, right superficial femoral-popliteal artery  7.  Conscious sedation, 71 minutes   Indications: Patient has nonhealing bilateral lower extremity wounds secondary to arterial insufficiency and venous insufficiency.  In [place] she has undergone bilateral femoral endarterectomy and SFA stenting.  These have not been evaluated for 4 years.  Her most recent ABIs showed monophasic waveforms.  She is here for further evaluation.  Procedure:  The patient was identified in the holding area and taken to room 8.  The patient was then placed supine on the table and prepped and draped in the usual sterile fashion.  A time out was called.  Conscious sedation was administered with the use of IV fentanyl and Versed under continuous physician and nurse monitoring.  Heart rate, blood pressure, and oxygen saturation were continuously monitored.  Total sedation time was 71 minutes.  Ultrasound was used to evaluate the left common femoral artery.  It was patent .  A digital ultrasound image was acquired.  A micropuncture needle was used to access the left common femoral artery under ultrasound guidance.  An 018 wire was advanced without resistance and a micropuncture sheath was placed.  The 018 wire was removed and a benson wire was placed.  The micropuncture sheath was exchanged for a 5 french sheath.  An omniflush catheter was advanced over the wire to the level of L-1.  An abdominal angiogram was obtained.  Next, the catheter was pulled out  of the aortic bifurcation and bilateral runoff was performed.  Findings:   Aortogram: The right renal artery is patent with ostial narrowing of approximately 50%.  There is stenosis within the left renal artery greater than 50% just beyond its origin.  The infrarenal abdominal aorta is heavily calcified without hemodynamically significant stenosis.  Bilateral common and external iliac arteries are widely patent.   Right Lower Extremity:   The right common femoral artery is widely patent with patulous dilatation consistent with prior surgery.  The profundofemoral artery is widely patent.  The superficial femoral artery is heavily calcified and patent throughout its course however at the level of the adductor canal there is a 70% stenosis and diffuse disease throughout the remaining portion of the above-knee popliteal artery.  Just below the joint space there is a 60% stenosis.  The anterior tibial artery has a short distance occlusion.  There is a 70% stenosis within the tibioperoneal trunk.  The peroneal artery is patent down to the ankle where it reconstitutes the posterior tibial.  Left Lower Extremity:   The left common femoral artery is widely patent with patulous dilatation consistent with prior surgery.  The profundofemoral artery is widely patent.  The superficial femoral artery and its associated stent are patent without hemodynamically significant stenosis.  The popliteal artery is patent at its course.  The dominant runoff vessel is anterior tibial artery which has several high-grade lesions in the proximal third.   Intervention: After the above images were acquired the decision was made to proceed with intervention.  A 6 French sheath was placed in the right external iliac artery.  The patient  was fully heparinized.  Using a V-18 wire and a Sterling 3 x 60 balloon, the occlusion in the anterior tibial artery was successfully crossed.  This was treated with balloon angioplasty at 8 atm for 2  minutes.  Completion imaging showed resolution of the occlusion with no residual stenosis.  Next the wire was then navigated into the peroneal artery in order to address the tibioperoneal trunk stenosis.  This was done with a 3 x 40 balloon with no residual stenosis after intervention.  I then withdrew the balloon into the distal popliteal artery and treated this lesion which looks about 60%.  This resolved with balloon angioplasty.  Next attention was turned towards the superficial femoral-popliteal artery.  A 4 x 150 drug-coated Ranger balloon was placed just distal to the joint space extending up into the adductor canal.  Angioplasty was performed at 9 atm for 3 minutes.  Completion imaging showed widely patent superficial femoral and popliteal artery.  There was residual stenosis of approximately 20% at the adductor canal but I elected to leave this as there was no evidence of dissection and we had a good result.  Catheters and wires were removed.  Patient to hold here for sheath pull once her coagulation profile correct.  Impression:  #1  Short segment right anterior tibial artery occlusion successfully crossed and treated with a 3 mm balloon  #2  60-70% tibioperoneal trunk stenosis successfully treated with a 3 mm balloon  #3  Diffuse disease within the superficial femoral and popliteal artery on the right treated using a 4 x 150 drug-coated balloon  #4  Bilateral renal artery stenosis  #5  Multiple lesions within the left anterior tibial artery at its origin which is the single-vessel runoff.  I will consider bringing the patient back for intervention given her left leg wound.   Theotis Burrow, M.D., [facility] [place] Office: [phone] Pager:  [phone]

## 2020-10-04 NOTE — Progress Notes (Signed)
Long 20f sheath aspirated and removed from left femoral artery. Manual pressure applied for 20 minutes. Site level 0 no S+S of hematoma. Tegaderm dressing applied, bedrest instructions given.   Bilateral dp pulses present with doppler.   Bedrest begins at 12:00:00 noon.

## 2020-10-04 NOTE — Interval H&P Note (Signed)
History and Physical Interval Note:  10/04/2020 7:35 AM  [name] [name]  has presented today for surgery, with the diagnosis of Atherosclerotics  of extremity  with ulceration.  The various methods of treatment have been discussed with the patient and family. After consideration of risks, benefits and other options for treatment, the patient has consented to  Procedure(s): ABDOMINAL AORTOGRAM W/LOWER EXTREMITY (N/A) as a surgical intervention.  The patient's history has been reviewed, patient examined, no change in status, stable for surgery.  I have reviewed the patient's chart and labs.  Questions were answered to the patient's satisfaction.     Annamarie Major

## 2020-10-04 NOTE — Discharge Instructions (Signed)
Femoral Site Care This sheet gives you information about how to care for yourself after your procedure. Your health care provider may also give you more specific instructions. If you have problems or questions, contact your health care provider. What can I expect after the procedure? After the procedure, it is common to have:  Bruising that usually fades within 1-2 weeks.  Tenderness at the site. Follow these instructions at home: Wound care  Follow instructions from your health care provider about how to take care of your insertion site. Make sure you: ? Wash your hands with soap and water before you change your bandage (dressing). If soap and water are not available, use hand sanitizer. ? Change your dressing as told by your health care provider. ? Leave stitches (sutures), skin glue, or adhesive strips in place. These skin closures may need to stay in place for 2 weeks or longer. If adhesive strip edges start to loosen and curl up, you may trim the loose edges. Do not remove adhesive strips completely unless your health care provider tells you to do that.  Do not take baths, swim, or use a hot tub until your health care provider approves.  You may shower 24-48 hours after the procedure or as told by your health care provider. ? Gently wash the site with plain soap and water. ? Pat the area dry with a clean towel. ? Do not rub the site. This may cause bleeding.  Do not apply powder or lotion to the site. Keep the site clean and dry.  Check your femoral site every day for signs of infection. Check for: ? Redness, swelling, or pain. ? Fluid or blood. ? Warmth. ? Pus or a bad smell. Activity  For the first 2-3 days after your procedure, or as long as directed: ? Avoid climbing stairs as much as possible. ? Do not squat.  Do not lift anything that is heavier than 10 lb (4.5 kg), or the limit that you are told, until your health care provider says that it is safe.  Rest as  directed. ? Avoid sitting for a long time without moving. Get up to take short walks every 1-2 hours.  Do not drive for 24 hours if you were given a medicine to help you relax (sedative). General instructions  Take over-the-counter and prescription medicines only as told by your health care provider.  Keep all follow-up visits as told by your health care provider. This is important. Contact a health care provider if you have:  A fever or chills.  You have redness, swelling, or pain around your insertion site. Get help right away if:  The catheter insertion area swells very fast.  You pass out.  You suddenly start to sweat or your skin gets clammy.  The catheter insertion area is bleeding, and the bleeding does not stop when you hold steady pressure on the area.  The area near or just beyond the catheter insertion site becomes pale, cool, tingly, or numb. These symptoms may represent a serious problem that is an emergency. Do not wait to see if the symptoms will go away. Get medical help right away. Call your local emergency services (911 in the [place] Do not drive yourself to the hospital. Summary  After the procedure, it is common to have bruising that usually fades within 1-2 weeks.  Check your femoral site every day for signs of infection.  Do not lift anything that is heavier than 10 lb (4.5 kg), or the   limit that you are told, until your health care provider says that it is safe. This information is not intended to replace advice given to you by your health care provider. Make sure you discuss any questions you have with your health care provider. Document Revised: 12/30/2017 Document Reviewed: 12/30/2017 Elsevier Patient Education  2020 [employer].

## 2020-10-04 NOTE — Progress Notes (Signed)
Discharge instructions reviewed with patient and family. Verbalized understanding. 

## 2020-10-05 ENCOUNTER — Other Ambulatory Visit: Payer: Self-pay

## 2020-10-05 ENCOUNTER — Encounter (HOSPITAL_COMMUNITY): Payer: Self-pay | Admitting: Surgery

## 2020-10-05 MED ORDER — PANTOPRAZOLE SODIUM 40 MG PO TBEC: 40.0000 mg | DELAYED_RELEASE_TABLET | Freq: Two times a day (BID) | ORAL | 3 refills | Status: AC

## 2020-10-05 NOTE — Telephone Encounter (Signed)
Patient called would like to know if you can refill her pantoprazole, she said the doctor that used to refill it has retired.

## 2020-10-07 ENCOUNTER — Telehealth: Payer: Self-pay

## 2020-10-07 NOTE — Telephone Encounter (Signed)
Patient called to ask about surgery on her L leg. Angio was performed on R leg 10/04/20. Note says L leg is a possibility. Told the patient I would consult with Brabham on Monday to determine what needs to happen next and f/u with her. Patient satisfied with that plan.

## 2020-10-10 ENCOUNTER — Telehealth: Payer: Self-pay

## 2020-10-10 ENCOUNTER — Other Ambulatory Visit: Payer: Self-pay

## 2020-10-10 ENCOUNTER — Encounter (HOSPITAL_BASED_OUTPATIENT_CLINIC_OR_DEPARTMENT_OTHER): Payer: Medicare Other | Attending: Internal Medicine | Admitting: Internal Medicine

## 2020-10-10 DIAGNOSIS — L97811 Non-pressure chronic ulcer of other part of right lower leg limited to breakdown of skin: Secondary | ICD-10-CM | POA: Insufficient documentation

## 2020-10-10 DIAGNOSIS — L97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: Secondary | ICD-10-CM | POA: Insufficient documentation

## 2020-10-10 DIAGNOSIS — I872 Venous insufficiency (chronic) (peripheral): Secondary | ICD-10-CM | POA: Diagnosis not present

## 2020-10-10 DIAGNOSIS — I83222 Varicose veins of left lower extremity with both ulcer of calf and inflammation: Secondary | ICD-10-CM | POA: Diagnosis not present

## 2020-10-10 DIAGNOSIS — L89616 Pressure-induced deep tissue damage of right heel: Secondary | ICD-10-CM | POA: Diagnosis not present

## 2020-10-10 DIAGNOSIS — L89613 Pressure ulcer of right heel, stage 3: Secondary | ICD-10-CM | POA: Diagnosis not present

## 2020-10-10 DIAGNOSIS — L97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: Secondary | ICD-10-CM | POA: Insufficient documentation

## 2020-10-10 DIAGNOSIS — L97812 Non-pressure chronic ulcer of other part of right lower leg with fat layer exposed: Secondary | ICD-10-CM | POA: Diagnosis not present

## 2020-10-10 NOTE — Telephone Encounter (Signed)
Patient had PV procedure scheduled at 0900 on 10-26. Time has moved to 1130 with patient to arrive at 0900. Attempted to call x2. Mailed a letter with time changes.

## 2020-10-10 NOTE — Progress Notes (Signed)
TIARE, ROHLMAN ([id number]) Visit Report for 10/10/2020 Arrival Information Details Patient Name: Date of Service: [name] 10/10/2020 12:30 PM Medical Record Number: [id number] Patient Account Number: [id number] Date of Birth/Sex: Treating RN: May 29, 1951 (69 y.o. [name], Meta.Reding Primary Care Kendra Woolford: [name] Other Clinician: Referring Kawana Hegel: Treating Emmeline Winebarger/Extender: [name] in Treatment: 65 Visit Information History Since Last Visit Added or deleted any medications: No Patient Arrived: Ambulatory Any new allergies or adverse reactions: No Arrival Time: 12:45 Had a fall or experienced change in No Accompanied By: husband activities of daily living that may affect Transfer Assistance: None risk of falls: Patient Identification Verified: Yes Signs or symptoms of abuse/neglect since last visito No Secondary Verification Process Completed: Yes Hospitalized since last visit: Yes Patient Requires Transmission-Based Precautions: No Implantable device outside of the clinic excluding No Patient Has Alerts: Yes cellular tissue based products placed in the center Patient Alerts: R ABI: [place] TBI: 0.41 since last visit: L ABI: [place] (07/2020) Has Dressing in Place as Prescribed: Yes Has Compression in Place as Prescribed: Yes Pain Present Now: No Notes Angioplasty to right leg x3 stents as last week. Electronic Signature(s) Signed: 10/10/2020 5:08:17 PM By: Deon Pilling Entered By: Deon Pilling on 10/10/2020 13:06:19 -------------------------------------------------------------------------------- Encounter Discharge Information Details Patient Name: Date of Service: [name] 10/10/2020 12:30 PM Medical Record Number: [id number] Patient Account Number: [id number] Date of Birth/Sex: Treating RN: [birth year] (69 y.o. [name] Primary Care Anaily Ashbaugh: [name] Other Clinician: Referring Loetta Connelley: Treating  Bassem Bernasconi/Extender: [name] in Treatment: [phone] Encounter Discharge Information Items Post Procedure Vitals Discharge Condition: Stable Temperature (F): 98.3 Ambulatory Status: Ambulatory Pulse (bpm): 78 Discharge Destination: Home Respiratory Rate (breaths/min): 18 Transportation: Private Auto Blood Pressure (mmHg): 180/73 Accompanied By: husband Schedule Follow-up Appointment: Yes Clinical Summary of Care: Patient Declined Electronic Signature(s) Signed: 10/10/2020 5:18:16 PM By: Kela Millin Entered By: Kela Millin on 10/10/2020 13:48:10 -------------------------------------------------------------------------------- Lower Extremity Assessment Details Patient Name: Date of Service: [name] 10/10/2020 12:30 PM Medical Record Number: [id number] Patient Account Number: [id number] Date of Birth/Sex: Treating RN: 09-26-1951 (69 y.o. [name] Primary Care Nikitta Sobiech: [name] Other Clinician: Referring Jeston Junkins: Treating Marria Mathison/Extender: [name] in Treatment: 446 Edema Assessment Assessed: [name]: Yes] [name]: Yes] Edema: [Left: Yes] [Right: Yes] Calf Left: Right: Point of Measurement: 36 cm From Medial Instep 30 cm 32 cm Ankle Left: Right: Point of Measurement: 10 cm From Medial Instep 21 cm 21 cm Vascular Assessment Pulses: Dorsalis Pedis Palpable: [Left:Yes] [Right:Yes] Electronic Signature(s) Signed: 10/10/2020 5:08:17 PM By: Deon Pilling Entered By: Deon Pilling on 10/10/2020 13:07:08 -------------------------------------------------------------------------------- Multi Wound Chart Details Patient Name: Date of Service: [name] 10/10/2020 12:30 PM Medical Record Number: [id number] Patient Account Number: [id number] Date of Birth/Sex: Treating RN: 05-14-1951 (69 y.o. 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] Primary Etiology: [16:Anemia, Hypertension, Peripheral] [19:Anemia, Hypertension, Peripheral] [20:Anemia, Hypertension, Peripheral] Comorbid History: [16:Arterial Disease, Peripheral Venous Disease, Raynauds, Scleroderma, Rheumatoid Arthritis, Osteoarthritis 01/04/2020] [  19:Arterial Disease, Peripheral Venous Disease, Raynauds, Scleroderma, Rheumatoid Arthritis, Osteoarthritis  05/02/2020] [20:Arterial Disease, Peripheral Venous Disease, Raynauds, Scleroderma, Rheumatoid Arthritis, Osteoarthritis 05/02/2020] Date Acquired: [16:40] [19:23] [20:23] Weeks of Treatment: [16:Open] [19:Open] [20:Open] Wound Status: [16:No] [19:No] [20:No] Clustered Wound: [16:0.5x1.3x0.5] [19:0.3x0.2x0.2] [20:0.8x0.8x0.6] Measurements L x W x D (cm) [16:0.511] [19:0.047] [20:0.503] A (cm) : rea [16:0.255] [19:0.009] [20:0.302] Volume (cm) : [16:95.90%] [19:66.70%] [20:-14.30%] % Reduction in A rea: [16:79.70%] [19:67.90%] [20:-243.20%] % Reduction in Volume: [16:3] Starting Position 1 (o'clock): [16:9] Ending Position 1 (o'clock): [16:1] Maximum Distance 1 (cm): [16:Yes] [19:No] [20:No] Undermining: [16:Category/Stage III] [19:Full Thickness Without Exposed] [20:Full Thickness Without Exposed] Classification: [16:Medium] [19:Support Structures Small] [20:Support Structures Medium] Exudate A mount: [16:Purulent] [19:Serous] [20:Purulent] Exudate Type: [16:yellow,  brown, green] [19:amber] [20:yellow, brown, green] Exudate Color: [16:Flat and Intact] [19:Flat and Intact] [20:Well defined, not attached] Wound Margin: [16:Small (1-33%)] [19:None Present (0%)] [20:None Present (0%)] Granulation A mount: [16:Pink] [19:N/A] [20:N/A] Granulation Quality: [16:Large (67-100%)] [19:Large (67-100%)] [20:Large (67-100%)] Necrotic A mount: [16:Fat Layer (Subcutaneous Tissue): Yes Fat Layer (Subcutaneous Tissue): Yes Fat Layer (Subcutaneous Tissue): Yes] Exposed Structures: [16:Fascia: No Tendon: No Muscle: No Joint: No Bone: No None] [19:Fascia: No Tendon: No Muscle: No Joint: No Bone: No None] [20:Fascia: No Tendon: No Muscle: No Joint: No Bone: No None] Epithelialization: [16:Debridement - Excisional] [19:N/A] [20:N/A] Debridement: Pre-procedure Verification/Time Out 13:32 [19:N/A] [20:N/A] Taken: [16:Other] [19:N/A] [20:N/A] Pain Control: [16:Subcutaneous, Slough] [19:N/A] [20:N/A] Tissue Debrided: [16:Skin/Subcutaneous Tissue] [19:N/A] [20:N/A] Level: [16:0.65] [19:N/A] [20:N/A] Debridement A (sq cm): [16:rea Curette] [19:N/A] [20:N/A] Instrument: [16:Minimum] [19:N/A] [20:N/A] Bleeding: [16:Pressure] [19:N/A] [20:N/A] Hemostasis A chieved: [16:4] [19:N/A] [20:N/A] Procedural Pain: [16:0] [19:N/A] [20:N/A] Post Procedural Pain: [16:Procedure was tolerated well] [19:N/A] [20:N/A] Debridement Treatment Response: [16:0.5x1.3x0.5] [19:N/A] [20:N/A] Post Debridement Measurements L x W x D (cm) [16:0.255] [19:N/A] [20:N/A] Post Debridement Volume: (cm) [16:Category/Stage III] [19:N/A] [20:N/A] Post Debridement Stage: [16:Debridement] [19:N/A] [20:N/A] Wound Number: _0 Photos: No Photos No Photos No Photos Right, Posterior Lower Leg Right, Anterior Lower Leg Left, Lateral Foot Wound Location: Gradually Appeared Gradually Appeared Gradually Appeared 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 Primary Etiology: Anemia,  Hypertension, Peripheral Anemia, Hypertension, Peripheral Anemia, Hypertension, Peripheral Comorbid History: Arterial Disease, Peripheral Venous Arterial Disease, Peripheral Venous Arterial Disease, Peripheral Venous Disease, Raynauds, Scleroderma, Disease, Raynauds, Scleroderma, Disease, Raynauds, Scleroderma, Rheumatoid Arthritis, OsteoarthritisRheumatoid Arthritis, Osteoarthritis Rheumatoid Arthritis, Osteoarthritis 08/15/2020 10/10/2020 10/10/2020 Date Acquired: 8 0 0 Weeks of Treatment: Open Open Open Wound Status: No No No Clustered Wound: 2x2x0.1 1.4x1.1x0.3 0.6x0.8x0.1 Measurements L x W x D (cm) 3.142 1.21 0.377 A (cm) : rea 0.314 0.363 0.038 Volume (cm) : -179.80% N/A N/A % Reduction in A rea: -180.40% N/A N/A % Reduction in Volume: No No No Undermining: Unclassifiable Full Thickness Without Exposed Unclassifiable Classification: Support Structures Small Medium Medium Exudate A mount: Purulent Purulent Purulent Exudate Type: yellow, brown, green yellow, brown, green yellow, brown, green Exudate Color: Distinct, outline attached Distinct, outline attached Distinct, outline attached Wound Margin: None Present (0%) None Present (0%) None Present (0%) Granulation A mount: N/A N/A N/A Granulation Quality: Large (67-100%) Large (67-100%) Large (67-100%) Necrotic A mount: Fat Layer (Subcutaneous Tissue): Yes Fascia: No Fascia: No Exposed Structures: Fascia: No Fat Layer (Subcutaneous Tissue): No Fat Layer (Subcutaneous Tissue): No Tendon: No Tendon: No Tendon: No Muscle: No Muscle: No Muscle: No Joint: No Joint: No Joint: No Bone: No Bone: No Bone: No Large (67-100%) None None Epithelialization: N/A Debridement - Excisional N/A Debridement: Pre-procedure Verification/Time Out N/A  13:32 N/A Taken: N/A Other N/A Pain Control: N/A Subcutaneous, Slough N/A Tissue Debrided: N/A Skin/Subcutaneous Tissue N/A Level: N/A 1.54 N/A Debridement A (sq  cm): rea N/A Curette N/A Instrument: N/A Minimum N/A Bleeding: N/A Pressure N/A Hemostasis A chieved: N/A 4 N/A Procedural Pain: N/A 0 N/A Post Procedural Pain: N/A Procedure was tolerated well N/A Debridement Treatment Response: N/A 1.4x1.1x0.3 N/A Post Debridement Measurements L x W x D (cm) N/A 0.363 N/A Post Debridement Volume: (cm) N/A N/A N/A Post Debridement Stage: N/A Debridement N/A Procedures Performed: Wound Number: 5 N/A N/A Photos: No Photos N/A N/A Right, Medial Lower Leg N/A N/A Wound Location: Gradually Appeared N/A N/A Wounding Event: Venous Leg Ulcer N/A N/A Primary Etiology: Anemia, Hypertension, Peripheral N/A N/A Comorbid History: Arterial Disease, Peripheral Venous Disease, Raynauds, Scleroderma, Rheumatoid Arthritis, Osteoarthritis 01/13/2013 N/A N/A Date Acquired: 402 N/A N/A Weeks of Treatment: Open N/A N/A Wound Status: Yes N/A N/A Clustered Wound: 2x1.9x0.3 N/A N/A Measurements L x W x D (cm) 2.985 N/A N/A A (cm) : rea 0.895 N/A N/A Volume (cm) : -111.10% N/A N/A % Reduction in A rea: -534.80% N/A N/A % Reduction in Volume: No N/A N/A Undermining: Full Thickness With Exposed Support N/A N/A Classification: Structures Medium N/A N/A Exudate A mount: Purulent N/A N/A Exudate Type: yellow, brown, green N/A N/A Exudate Color: Distinct, outline attached N/A N/A Wound Margin: Small (1-33%) N/A N/A Granulation A mount: Red, Pink N/A N/A Granulation Quality: Large (67-100%) N/A N/A Necrotic A mount: Fat Layer (Subcutaneous Tissue): Yes N/A N/A Exposed Structures: Fascia: No Tendon: No Muscle: No Joint: No Bone: No Small (1-33%) N/A N/A Epithelialization: Debridement - Excisional N/A N/A Debridement: Pre-procedure Verification/Time Out 13:32 N/A N/A Taken: Other N/A N/A Pain Control: Subcutaneous, Slough N/A N/A Tissue Debrided: Skin/Subcutaneous Tissue N/A N/A Level: 3.8 N/A N/A Debridement A (sq  cm): rea Curette N/A N/A Instrument: Minimum N/A N/A Bleeding: Pressure N/A N/A Hemostasis A chieved: 4 N/A N/A Procedural Pain: 0 N/A N/A Post Procedural Pain: Procedure was tolerated well N/A N/A Debridement Treatment Response: 2x1.9x0.3 N/A N/A Post Debridement Measurements L x W x D (cm) 0.895 N/A N/A Post Debridement Volume: (cm) N/A N/A N/A Post Debridement Stage: Debridement N/A N/A Procedures Performed: Treatment Notes Electronic Signature(s) Signed: 10/10/2020 5:03:49 PM By: Linton Ham MD Signed: 10/10/2020 5:10:40 PM By: Levan Hurst RN, BSN Entered By: Linton Ham on 10/10/2020 13:40:59 -------------------------------------------------------------------------------- Multi-Disciplinary Care Plan Details Patient Name: Date of Service: [name], [name] 10/10/2020 12:30 PM Medical Record Number: [id number] Patient Account Number: [id number] Date of Birth/Sex: Treating RN: [birth year] (69 y.o. [name] Primary Care Yousif Edelson: [name] Other Clinician: Referring Kimanh Templeman: Treating Caysen Whang/Extender: [name] in Treatment: 446 Active Inactive Wound/Skin Impairment Nursing Diagnoses: Impaired tissue integrity Knowledge deficit related to ulceration/compromised skin integrity Goals: Patient/caregiver will verbalize understanding of skin care regimen Date Initiated: 10/11/2016 Target Resolution Date: 11/11/2020 Goal Status: Active Interventions: Assess patient/caregiver ability to obtain necessary supplies Assess patient/caregiver ability to perform ulcer/skin care regimen upon admission and as needed Assess ulceration(s) every visit Provide education on ulcer and skin care Treatment Activities: Skin care regimen initiated : 10/11/2016 Topical wound management initiated : 10/11/2016 Notes: Electronic Signature(s) Signed: 10/10/2020 5:10:40 PM By: Levan Hurst RN, BSN Entered By: Levan Hurst on  10/10/2020 12:54:33 -------------------------------------------------------------------------------- Pain Assessment Details Patient Name: Date of Service: [name] 10/10/2020 12:30 PM Medical Record Number: [id number] Patient Account Number: [id number] Date of Birth/Sex:  Treating RN: 1951/03/15 (69 y.o. [name] Primary Care Devi Hopman: [name] Other Clinician: Referring Rasa Degrazia: Treating Biance Moncrief/Extender: [name] in Treatment: 446 Active Problems Location of Pain Severity and Description of Pain Patient Has Paino No Site Locations Rate the pain. Rate the pain. Current Pain Level: 0 Pain Management and Medication Current Pain Management: Medication: No Cold Application: No Rest: No Massage: No Activity: No T.E.N.S.: No Heat Application: No Leg drop or elevation: No Is the Current Pain Management Adequate: Adequate How does your wound impact your activities of daily livingo Sleep: No Bathing: No Appetite: No Relationship With Others: No Bladder Continence: No Emotions: No Bowel Continence: No Work: No Toileting: No Drive: No Dressing: No Hobbies: No Electronic Signature(s) Signed: 10/10/2020 5:08:17 PM By: Deon Pilling Entered By: Deon Pilling on 10/10/2020 13:06:47 -------------------------------------------------------------------------------- Patient/Caregiver Education Details Patient Name: Date of Service: [name] 10/11/2021andnbsp12:30 PM Medical Record Number: [id number] Patient Account Number: [id number] Date of Birth/Gender: Treating RN: Feb 18, 1951 (69 y.o. [name] Primary Care Physician: [name] Other Clinician: Referring Physician: Treating Physician/Extender: [name] in Treatment: 22 Education Assessment Education Provided To: Patient Education Topics Provided Wound/Skin Impairment: Methods: Explain/Verbal Responses: State content  correctly [employer]) Signed: 10/10/2020 5:10:40 PM By: Levan Hurst RN, BSN Entered By: Levan Hurst on 10/10/2020 12:54:49 -------------------------------------------------------------------------------- Wound Assessment Details Patient Name: Date of Service: [name] 10/10/2020 12:30 PM Medical Record Number: [id number] Patient Account Number: [id number] Date of Birth/Sex: Treating RN: 20-Jul-1951 (69 y.o. [name], Meta.Reding Primary Care Eusebio Blazejewski: [name] Other Clinician: Referring Jennifer Holland: Treating Eren Puebla/Extender: [name] in Treatment: [place] Wound Status Wound Number: 16 Primary Pressure Ulcer Etiology: Wound Location: Right Calcaneus Wound Open Wounding Event: Pressure Injury Status: Date Acquired: 01/04/2020 Comorbid Anemia, Hypertension, Peripheral Arterial Disease, Peripheral Weeks Of Treatment: 40 History: Venous Disease, Raynauds, Scleroderma, Rheumatoid Arthritis, Clustered Wound: No Osteoarthritis Wound Measurements Length: (cm) 0.5 Width: (cm) 1.3 Depth: (cm) 0.5 Area: (cm) 0.511 Volume: (cm) 0.255 % Reduction in Area: 95.9% % Reduction in Volume: 79.7% Epithelialization: None Tunneling: No Undermining: Yes Starting Position (o'clock): 3 Ending Position (o'clock): 9 Maximum Distance: (cm) 1 Wound Description Classification: Category/Stage III Wound Margin: Flat and Intact Exudate Amount: Medium Exudate Type: Purulent Exudate Color: yellow, brown, green Foul Odor After Cleansing: No Slough/Fibrino Yes Wound Bed Granulation Amount: Small (1-33%) Exposed Structure Granulation Quality: Pink Fascia Exposed: No Necrotic Amount: Large (67-100%) Fat Layer (Subcutaneous Tissue) Exposed: Yes Necrotic Quality: Adherent Slough Tendon Exposed: No Muscle Exposed: No Joint Exposed: No Bone Exposed: No Treatment Notes Wound #16 (Right Calcaneus) 1. Cleanse With Wound Cleanser Soap and water 2.  Periwound Care Moisturizing lotion 3. Primary Dressing Applied Iodoflex 4. Secondary Dressing Dry Gauze 6. Support Layer Holiday representative) Signed: 10/10/2020 5:08:17 PM By: Deon Pilling Entered By: Deon Pilling on 10/10/2020 13:08:33 -------------------------------------------------------------------------------- Wound Assessment Details Patient Name: Date of Service: [name] 10/10/2020 12:30 PM Medical Record Number: [id number] Patient Account Number: [id number] Date of Birth/Sex: Treating RN: [birth year] (69 y.o. [name] Primary Care Omara Alcon: [name] Other Clinician: Referring Axle Parfait: Treating Korynne Dols/Extender: [name] in Treatment: 446 Wound Status Wound Number: 19 Primary Venous Leg Ulcer Etiology: Wound Location: Right, Lateral Malleolus Wound Open Wounding Event: Gradually Appeared Status: Date Acquired: 05/02/2020 Comorbid Anemia, Hypertension, Peripheral Arterial Disease, Peripheral Weeks Of Treatment: 23 History: Venous Disease, Raynauds, Scleroderma, Rheumatoid Arthritis,  Clustered Wound: No Osteoarthritis Wound Measurements Length: (cm) 0.3 Width: (cm) 0.2 Depth: (cm) 0.2 Area: (cm) 0.047 Volume: (cm) 0.009 % Reduction in Area: 66.7% % Reduction in Volume: 67.9% Epithelialization: None Tunneling: No Undermining: No Wound Description Classification: Full Thickness Without Exposed Support Structures Wound Margin: Flat and Intact Exudate Amount: Small Exudate Type: Serous Exudate Color: amber Foul Odor After Cleansing: No Slough/Fibrino Yes Wound Bed Granulation Amount: None Present (0%) Exposed Structure Necrotic Amount: Large (67-100%) Fascia Exposed: No Necrotic Quality: Adherent Slough Fat Layer (Subcutaneous Tissue) Exposed: Yes Tendon Exposed: No Muscle Exposed: No Joint Exposed: No Bone Exposed: No Treatment Notes Wound #19 (Right, Lateral  Malleolus) 1. Cleanse With Wound Cleanser Soap and water 2. Periwound Care Moisturizing lotion 3. Primary Dressing Applied Iodoflex 4. Secondary Dressing Dry Gauze 6. Support Layer Holiday representative) Signed: 10/10/2020 5:08:17 PM By: Deon Pilling Entered By: Deon Pilling on 10/10/2020 13:09:17 -------------------------------------------------------------------------------- Wound Assessment Details Patient Name: Date of Service: [name] 10/10/2020 12:30 PM Medical Record Number: [id number] Patient Account Number: [id number] Date of Birth/Sex: Treating RN: 07-Jun-1951 (69 y.o. [name], Meta.Reding Primary Care Jong Rickman: [name] Other Clinician: Referring Kitara Hebb: Treating Bayley Hurn/Extender: [name] in Treatment: 446 Wound Status Wound Number: 20 Primary Arterial Insufficiency Ulcer Etiology: Wound Location: Left, Lateral Malleolus Wound Open Wounding Event: Gradually Appeared Status: Date Acquired: 05/02/2020 Comorbid Anemia, Hypertension, Peripheral Arterial Disease, Peripheral Weeks Of Treatment: 23 History: Venous Disease, Raynauds, Scleroderma, Rheumatoid Arthritis, Clustered Wound: No Osteoarthritis Wound Measurements Length: (cm) 0.8 Width: (cm) 0.8 Depth: (cm) 0.6 Area: (cm) 0.503 Volume: (cm) 0.302 % Reduction in Area: -14.3% % Reduction in Volume: -243.2% Epithelialization: None Tunneling: No Undermining: No Wound Description Classification: Full Thickness Without Exposed Support Structures Wound Margin: Well defined, not attached Exudate Amount: Medium Exudate Type: Purulent Exudate Color: yellow, brown, green Foul Odor After Cleansing: No Slough/Fibrino Yes Wound Bed Granulation Amount: None Present (0%) Exposed Structure Necrotic Amount: Large (67-100%) Fascia Exposed: No Necrotic Quality: Adherent Slough Fat Layer (Subcutaneous Tissue) Exposed: Yes Tendon Exposed:  No Muscle Exposed: No Joint Exposed: No Bone Exposed: No Treatment Notes Wound #20 (Left, Lateral Malleolus) 1. Cleanse With Wound Cleanser Soap and water 2. Periwound Care Moisturizing lotion 3. Primary Dressing Applied Iodoflex 4. Secondary Dressing Dry Gauze 6. Support Layer Holiday representative) Signed: 10/10/2020 5:08:17 PM By: Deon Pilling Entered By: Deon Pilling on 10/10/2020 13:10:47 -------------------------------------------------------------------------------- Wound Assessment Details Patient Name: Date of Service: [name] 10/10/2020 12:30 PM Medical Record Number: [id number] Patient Account Number: [id number] Date of Birth/Sex: Treating RN: March 08, 1951 (69 y.o. [name], Meta.Reding Primary Care Enes Rokosz: [name] Other Clinician: Referring Mikell Kazlauskas: Treating Ricke Kimoto/Extender: [name] in Treatment: 446 Wound Status Wound Number: 23 Primary Venous Leg Ulcer Etiology: Wound Location: Right, Posterior Lower Leg Wound Open Wounding Event: Gradually Appeared Status: Date Acquired: 08/15/2020 Comorbid Anemia, Hypertension, Peripheral Arterial Disease, Peripheral Weeks Of Treatment: 8 History: Venous Disease, Raynauds, Scleroderma, Rheumatoid Arthritis, Clustered Wound: No Osteoarthritis Wound Measurements Length: (cm) 2 Width: (cm) 2 Depth: (cm) 0.1 Area: (cm) 3.142 Volume: (cm) 0.314 % Reduction in Area: -179.8% % Reduction in Volume: -180.4% Epithelialization: Large (67-100%) Tunneling: No Undermining: No Wound Description Classification: Unclassifiable Wound Margin: Distinct, outline attached Exudate Amount: Small Exudate Type: Purulent Exudate Color: yellow, brown, green Foul Odor After Cleansing: No Slough/Fibrino Yes Wound Bed Granulation Amount: None Present (0%) Exposed Structure Necrotic Amount: Large (67-100%) Fascia Exposed: No Necrotic  Quality: Adherent Slough Fat  Layer (Subcutaneous Tissue) Exposed: Yes Tendon Exposed: No Muscle Exposed: No Joint Exposed: No Bone Exposed: No Treatment Notes Wound #23 (Right, Posterior Lower Leg) 1. Cleanse With Wound Cleanser Soap and water 2. Periwound Care Moisturizing lotion 3. Primary Dressing Applied Iodoflex 4. Secondary Dressing Dry Gauze 6. Support Layer Holiday representative) Signed: 10/10/2020 5:08:17 PM By: Deon Pilling Entered By: Deon Pilling on 10/10/2020 13:13:27 -------------------------------------------------------------------------------- Wound Assessment Details Patient Name: Date of Service: [name] 10/10/2020 12:30 PM Medical Record Number: [id number] Patient Account Number: [id number] Date of Birth/Sex: Treating RN: 1951-11-07 (69 y.o. [name], Meta.Reding Primary Care Salimah Martinovich: [name] Other Clinician: Referring Leahmarie Gasiorowski: Treating Boone Gear/Extender: [name] in Treatment: 446 Wound Status Wound Number: 24 Primary Venous Leg Ulcer Etiology: Wound Location: Right, Anterior Lower Leg Wound Open Wounding Event: Gradually Appeared Status: Date Acquired: 10/10/2020 Comorbid Anemia, Hypertension, Peripheral Arterial Disease, Peripheral Weeks Of Treatment: 0 History: Venous Disease, Raynauds, Scleroderma, Rheumatoid Arthritis, Clustered Wound: No Osteoarthritis Wound Measurements Length: (cm) 1.4 Width: (cm) 1.1 Depth: (cm) 0.3 Area: (cm) 1.21 Volume: (cm) 0.363 % Reduction in Area: % Reduction in Volume: Epithelialization: None Tunneling: No Undermining: No Wound Description Classification: Full Thickness Without Exposed Support Structures Wound Margin: Distinct, outline attached Exudate Amount: Medium Exudate Type: Purulent Exudate Color: yellow, brown, green Foul Odor After Cleansing: No Slough/Fibrino Yes Wound Bed Granulation Amount: None Present (0%) Exposed Structure Necrotic Amount:  Large (67-100%) Fascia Exposed: No Necrotic Quality: Adherent Slough Fat Layer (Subcutaneous Tissue) Exposed: No Tendon Exposed: No Muscle Exposed: No Joint Exposed: No Bone Exposed: No Treatment Notes Wound #24 (Right, Anterior Lower Leg) 1. Cleanse With Wound Cleanser Soap and water 2. Periwound Care Moisturizing lotion 3. Primary Dressing Applied Iodoflex 4. Secondary Dressing Dry Gauze 6. Support Layer Holiday representative) Signed: 10/10/2020 5:08:17 PM By: Deon Pilling Entered By: Deon Pilling on 10/10/2020 13:14:46 -------------------------------------------------------------------------------- Wound Assessment Details Patient Name: Date of Service: [name] 10/10/2020 12:30 PM Medical Record Number: [id number] Patient Account Number: [id number] Date of Birth/Sex: Treating RN: [birth year] (69 y.o. [name], Meta.Reding Primary Care Teairra Millar: [name] Other Clinician: Referring Nya Monds: Treating Chaz Mcglasson/Extender: [name] in Treatment: 446 Wound Status Wound Number: 25 Primary Venous Leg Ulcer Etiology: Wound Location: Left, Lateral Foot Wound Open Wounding Event: Gradually Appeared Status: Date Acquired: 10/10/2020 Comorbid Anemia, Hypertension, Peripheral Arterial Disease, Peripheral Weeks Of Treatment: 0 History: Venous Disease, Raynauds, Scleroderma, Rheumatoid Arthritis, Clustered Wound: No Osteoarthritis Wound Measurements Length: (cm) 0.6 Width: (cm) 0.8 Depth: (cm) 0.1 Area: (cm) 0.377 Volume: (cm) 0.038 % Reduction in Area: % Reduction in Volume: Epithelialization: None Tunneling: No Undermining: No Wound Description Classification: Unclassifiable Wound Margin: Distinct, outline attached Exudate Amount: Medium Exudate Type: Purulent Exudate Color: yellow, brown, green Foul Odor After Cleansing: No Slough/Fibrino Yes Wound Bed Granulation Amount: None Present (0%) Exposed  Structure Necrotic Amount: Large (67-100%) Fascia Exposed: No Necrotic Quality: Adherent Slough Fat Layer (Subcutaneous Tissue) Exposed: No Tendon Exposed: No Muscle Exposed: No Joint Exposed: No Bone Exposed: No Treatment Notes Wound #25 (Left, Lateral Foot) 1. Cleanse With Wound Cleanser Soap and water 2. Periwound Care Moisturizing lotion 3. Primary Dressing Applied Iodoflex 4. Secondary Dressing Dry Gauze 6. Support Layer Holiday representative) Signed: 10/10/2020 5:08:17 PM By: Deon Pilling Entered By: Deon Pilling on 10/10/2020 13:15:51 -------------------------------------------------------------------------------- Wound Assessment Details Patient Name: Date of Service: [name], JO A N  F. 10/10/2020 12:30 PM Medical Record Number: [id number] Patient Account Number: [id number] Date of Birth/Sex: Treating RN: May 03, 1951 (69 y.o. [name], Meta.Reding Primary Care Tarrence Enck: [name] Other Clinician: Referring Lasasha Brophy: Treating Havilah Topor/Extender: [name] in Treatment: 446 Wound Status Wound Number: 5 Primary Venous Leg Ulcer Etiology: Wound Location: Right, Medial Lower Leg Wound Open Wounding Event: Gradually Appeared Status: Date Acquired: 01/13/2013 Comorbid Anemia, Hypertension, Peripheral Arterial Disease, Peripheral Weeks Of Treatment: 402 History: Venous Disease, Raynauds, Scleroderma, Rheumatoid Arthritis, Clustered Wound: Yes Osteoarthritis Wound Measurements Length: (cm) 2 Width: (cm) 1.9 Depth: (cm) 0.3 Area: (cm) 2.985 Volume: (cm) 0.895 % Reduction in Area: -111.1% % Reduction in Volume: -534.8% Epithelialization: Small (1-33%) Tunneling: No Undermining: No Wound Description Classification: Full Thickness With Exposed Support Structures Wound Margin: Distinct, outline attached Exudate Amount: Medium Exudate Type: Purulent Exudate Color: yellow, brown, green Foul Odor After  Cleansing: No Slough/Fibrino Yes Wound Bed Granulation Amount: Small (1-33%) Exposed Structure Granulation Quality: Red, Pink Fascia Exposed: No Necrotic Amount: Large (67-100%) Fat Layer (Subcutaneous Tissue) Exposed: Yes Necrotic Quality: Adherent Slough Tendon Exposed: No Muscle Exposed: No Joint Exposed: No Bone Exposed: No Treatment Notes Wound #5 (Right, Medial Lower Leg) 1. Cleanse With Wound Cleanser Soap and water 2. Periwound Care Moisturizing lotion 3. Primary Dressing Applied Iodoflex 4. Secondary Dressing Dry Gauze 6. Support Layer Holiday representative) Signed: 10/10/2020 5:08:17 PM By: Deon Pilling Entered By: Deon Pilling on 10/10/2020 13:12:57 -------------------------------------------------------------------------------- Vitals Details Patient Name: Date of Service: [name], [name] 10/10/2020 12:30 PM Medical Record Number: [id number] Patient Account Number: [id number] Date of Birth/Sex: Treating RN: 1951/04/03 (69 y.o. [name], Meta.Reding Primary Care Modesty Rudy: [name] Other Clinician: Referring Micky Sheller: Treating Trentan Trippe/Extender: [name] in Treatment: 446 Vital Signs Time Taken: 12:41 Temperature (F): 98.3 Height (in): 68 Pulse (bpm): 78 Weight (lbs): 132 Respiratory Rate (breaths/min): 18 Body Mass Index (BMI): 20.1 Blood Pressure (mmHg): 180/73 Reference Range: 80 - 120 mg / dl Electronic Signature(s) Signed: 10/10/2020 5:08:17 PM By: Deon Pilling Entered By: Deon Pilling on 10/10/2020 13:06:36

## 2020-10-10 NOTE — Progress Notes (Signed)
[name], [name] ([id number]) Visit Report for 10/10/2020 Debridement Details Patient Name: Date of Service: [name] [name] 10/10/2020 12:30 PM Medical Record Number: [id number] Patient Account Number: [id number] Date of Birth/Sex: Treating RN: February 19, 1951 (69 y.o. [name] Primary Care Provider: [name] Other Clinician: Referring Provider: Treating Provider/Extender: [name] in Treatment: 446 Debridement Performed for Assessment: Wound #5 Right,Medial Lower Leg Performed By: Physician Ricard Dillon., MD Debridement Type: Debridement Severity of Tissue Pre Debridement: Fat layer exposed Level of Consciousness (Pre-procedure): Awake and Alert Pre-procedure Verification/Time Out Yes - 13:32 Taken: Start Time: 13:32 Pain Control: Other : Benzocaine 20% T Area Debrided (L x W): otal 2 (cm) x 1.9 (cm) = 3.8 (cm) Tissue and other material debrided: Viable, Non-Viable, Slough, Subcutaneous, Slough Level: Skin/Subcutaneous Tissue Debridement Description: Excisional Instrument: Curette Bleeding: Minimum Hemostasis Achieved: Pressure End Time: 13:34 Procedural Pain: 4 Post Procedural Pain: 0 Response to Treatment: Procedure was tolerated well Level of Consciousness (Post- Awake and Alert procedure): Post Debridement Measurements of Total Wound Length: (cm) 2 Width: (cm) 1.9 Depth: (cm) 0.3 Volume: (cm) 0.895 Character of Wound/Ulcer Post Debridement: Improved Severity of Tissue Post Debridement: Fat layer exposed Post Procedure Diagnosis Same as Pre-procedure Electronic Signature(s) Signed: 10/10/2020 5:03:49 PM By: Linton Ham MD Signed: 10/10/2020 5:10:40 PM By: Levan Hurst RN, BSN Entered By: Levan Hurst on 10/10/2020 13:34:24 -------------------------------------------------------------------------------- Debridement Details Patient Name: Date of Service: [name] [name], [name] 10/10/2020 12:30 PM Medical Record  Number: [id number] Patient Account Number: [id number] Date of Birth/Sex: Treating RN: 1951/08/06 (69 y.o. [name] Primary Care Provider: [name] Other Clinician: Referring Provider: Treating Provider/Extender: [name] in Treatment: 446 Debridement Performed for Assessment: Wound #24 Right,Anterior Lower Leg Performed By: Physician Ricard Dillon., MD Debridement Type: Debridement Severity of Tissue Pre Debridement: Fat layer exposed Level of Consciousness (Pre-procedure): Awake and Alert Pre-procedure Verification/Time Out Yes - 13:32 Taken: Start Time: 13:32 Pain Control: Other : Benzocaine 20% T Area Debrided (L x W): otal 1.4 (cm) x 1.1 (cm) = 1.54 (cm) Tissue and other material debrided: Viable, Non-Viable, Slough, Subcutaneous, Slough Level: Skin/Subcutaneous Tissue Debridement Description: Excisional Instrument: Curette Bleeding: Minimum Hemostasis Achieved: Pressure End Time: 13:34 Procedural Pain: 4 Post Procedural Pain: 0 Response to Treatment: Procedure was tolerated well Level of Consciousness (Post- Awake and Alert procedure): Post Debridement Measurements of Total Wound Length: (cm) 1.4 Width: (cm) 1.1 Depth: (cm) 0.3 Volume: (cm) 0.363 Character of Wound/Ulcer Post Debridement: Improved Severity of Tissue Post Debridement: Fat layer exposed Post Procedure Diagnosis Same as Pre-procedure Electronic Signature(s) Signed: 10/10/2020 5:03:49 PM By: Linton Ham MD Signed: 10/10/2020 5:10:40 PM By: Levan Hurst RN, BSN Entered By: Levan Hurst on 10/10/2020 13:35:12 -------------------------------------------------------------------------------- Debridement Details Patient Name: Date of Service: [name] [name], [name] 10/10/2020 12:30 PM Medical Record Number: [id number] Patient Account Number: [id number] Date of Birth/Sex: Treating RN: Aug 14, 1951 (69 y.o. 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 Performed By: Physician Ricard Dillon., MD Debridement Type: Debridement Level of Consciousness (Pre-procedure): Awake and Alert Pre-procedure Verification/Time Out Yes - 13:32 Taken: Start Time: 13:32 Pain Control: Other : Benzocaine 20% T Area Debrided (L x W): otal 0.5 (cm) x 1.3 (cm) = 0.65 (cm) Tissue and other material debrided: Viable, Non-Viable, Slough, Subcutaneous, Slough Level: Skin/Subcutaneous Tissue  Debridement Description: Excisional Instrument: Curette Bleeding: Minimum Hemostasis Achieved: Pressure End Time: 13:34 Procedural Pain: 4 Post Procedural Pain: 0 Response to Treatment: Procedure was tolerated well Level of Consciousness (Post- Awake and Alert procedure): Post Debridement Measurements of Total Wound Length: (cm) 0.5 Stage: Category/Stage III Width: (cm) 1.3 Depth: (cm) 0.5 Volume: (cm) 0.255 Character of Wound/Ulcer Post Debridement: Improved Post Procedure Diagnosis Same as Pre-procedure Electronic Signature(s) Signed: 10/10/2020 5:03:49 PM By: Linton Ham MD Signed: 10/10/2020 5:10:40 PM By: Levan Hurst RN, BSN Entered By: Levan Hurst on 10/10/2020 13:36:28 -------------------------------------------------------------------------------- HPI Details Patient Name: Date of Service: [name] [name], [name] Durward Parcel F. 10/10/2020 12:30 PM Medical Record Number: [id number] Patient Account Number: [id number] Date of Birth/Sex: Treating RN: [birth year] (69 y.o. [name] Primary Care Provider: [name] Other Clinician: Referring Provider: Treating Provider/Extender: [name] in Treatment: 45 History of Present Illness HPI Description: this is a patient who initially came to [place] for wounds on the medial malleoli bilaterally  as well as her upper medial lower extremities bilaterally. These wounds eventually healed with assistance of Apligraf's bilaterally. While this was occurring she developed the current wound which opened into a fairly substantial wound on the right lower extremity. These are mostly secondary to venous stasis physiology however the patient also has underlying scleroderma, pulmonary hypertension. The wound has been making good progress lately with the Hydrofera Blue-based dressing. 03/17/2015; patient had a arterial evaluation a year ago. Her right ABI was 0.86 left was 1.0. T brachial index was 0.41 on the right 0.45 on the left. Her oe bilateral common femoral artery waveforms were triphasic. Her white popliteal posterior tibial artery and anterior tibial artery waveforms were monophasic with good amplitude. Luteal artery waveforms were biphasic it was felt that her bilateral great toe pressures are of normal although adequate for tissue healing. 03/24/2015. The condition of this wound is not really improved that. He was covered with as fibrinous surface slough and eschar. This underwent an aggressive debridement with both a curette and scalpel. I still cannot really get down to what I can would consider to be a viable surface. There is no evidence of infection. Previous workup for ischemia roughly a year ago was negative nevertheless I think that continues to be a concern 04/07/15. The patient arrived for application of second Apligraf. Once again the surface of this wound is certainly less viable than I would like for an advanced treatment option. An aggressive debridement was done. She developed some arteriolar bleeding which required that along pressure and silver alginate. 04/21/15: change in the condition of this wound. Once again it is covered in a gelatinous surface slough. After debridement today surface of the wound looks somewhat better but now a heeling surface 05/05/15 Apligraf #4 applied.Still a  lot of slough on this wound. 05/19/15 Apligraf #5 applied. Still a lot of slough on this wound I did not aggressively remove this 06/02/15; continued copious amounts of surface slough. This debridement fairly easily. 06/08/2015 -- the last time she had a venous duplex study done was over 3 years ago and the surgery was prior to that. I have recommended that she sees Dr. early for a another opinion regarding a repeat venous duplex and possibly more endovenous ablation of vein stripping of micro-phlebotomies. 06/16/15; wound has a gelatinous surface eschar that the debridement fairly easily to a point. I don't disagree with the venous workup and perhaps even arterial re-evaluation. She is on prednisone 5 mg and continue his  medications for her pulmonary artery hypertension I am not sure if the latter have any wound care healing issues I would need to investigate this. 06/23/15 continues with a gelatinous surface eschar with of fibrinous underlying. What I can see of this wound does not look unhealthy however I just can't get this material which I think is 2 different layers off. Empiric culture done 06/30/15; unfortunately not a lot of change in this wound. A gelatinous surface eschar is easily removed however it has a tight fibrinous surface underneath the. 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 The wound comes back and basically and unchanged state the. She has a gelatinous surface that is more easily removed however there is a tightly fibrinous surface underneath the. There is no evidence of infection. She has a vascular follow-up next month. I would have to inject her in order to do a more aggressive debridement of this area 07/21/15 the wound is roughly in the same state albeit the debridement is done with greater ease. There is less of the fibrinous eschar underneath the. There is no evidence of infection. She has follow-up with vascular surgery next week. No evidence of surrounding  infection. Her original distal wounds healed while this one formed. 07/28/15; wound is easier to debride. No wound erythema. She is seeing Dr. [name] next week. 08/18/15 Has been to [place] for repeat ablation. Have been using medihoney pad with some improvement 08/25/15; absolutely no change in the condition of this wound in either its overall size or surface condition in many months now. At one point I had this down to [place] healthier surface I think with [facility] however this did not actually progress towards closure. Do not believe that the wound has actually deteriorated in terms of volume at all. We have been using a medihoney pad which allows easier debridement of the gelatinous eschar but again no overall actual improvement. the patient is going towards an ablation with pain and vascular which I think is scheduled for next week. The only other investigation that I could first see would be a biopsy. She does have underlying scleroderma 09/02/15 eschar is much easier to debridement however the base of this does not look particularly vibrant. We changed to Iodoflex. The patient had her ablation earlier this week 09/09/15 again the debridement over the base of this wound is easier and the base of the wound looks considerably better. We will continue the Iodoflex. Dr. Tawni Millers has expressed his satisfaction with the result of her ablation 09/15/15 once again the wound is relatively free of surface eschar. No debridement was done today. It has a pale-looking base to it. although this is not as deep as it once was it seems to be expanding especially inferiorly. She has had recent venous ablation but this is no closer to healing.I've gone ahead and done a punch biopsy this from the inferior part of the wound close to normal skin 09/22/15: the wound is relatively free of surface eschar. There is some surrounding eschar. I'm not exactly sure at what level the surface is that I am seeing. 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 No evidence of atypical infection, malignancy. 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 No debridement was done 09/29/15; the wound surface is relatively free of surface eschar. There is eschar surrounding the walls of the wound. Aside from the improvement in the amount of surface slough. This wound has not progressed  any towards closure. There is not even a surface that looks like there at this is ready. There is no evidence of any infection nor maligancy based on biopsy I did on 9/15. I continued with the Iodoflex however I am looking towards some alternative to try to promote some closure or filling in of this surface. Consider triple layer Oasis. Collagen did not result in adequate control of the surface slough 10/13/15; the patient was in hospital last week with severe anemia. The wound looks somewhat better after debridement although there is widening medially. There is no evidence of infection. 10/20/15; patient's wound on the right lateral lower leg is essentially unchanged. This underwent a light surface debridement and in general the debridement is easier and the surface looks improved. I noted in doing this on the side of the wound what appears to be a piece of calcium deposition. The patient noted that she had previous things on the tips of her fingers. 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 She follows with rheumatology and I have asked them to talk to her about this. In view of that S the nonhealing ulcer may have something to do with calcinosis and also unrelieved Raynaud's disease in this area. I should note that her original wounds on the right and left medial malleolus and the inner aspects of both legs just below the knees did however heal over 10/30/15; the patient's wound on the right lateral lower leg is essentially  unchanged. I was able to remove some calcified material from the medial wound edge. 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 Otherwise the wound appears essentially unchanged there is less adherent surface eschar. Some of the calcified material was sent to pathology for analysis 11/17/15. The patient's wound on the right lateral leg is essentially unchanged. Wider Medially. 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 There is some surface slough but I did not debridement this. No evidence of surrounding infection. 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 This will make healing difficult 12/15/15; the wound has less depth surface has a fibrinous slough and calcifications in the wound edge no evidence of infection 12/22/15; the wound definitely has less Fibrinous slough on the base. Calcifications around the wound edges are still evident. Although the wound bed looks healthier it is still pale in appearance. Previous biopsy did not show malignancy 01/04/15; surgical debridement of nonviable slough and subcutaneous tissue the wound cleans up quite nicely but appears to be expanding outward calcifications around the wound edges are still there. 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 She is to see her rheumatologist I'll see if he has any opinions on this. My punch biopsy done in September did not show calcifications although these are clearly evident. 01/19/16 light selective debridement of nonviable surface slough. There is epithelialization medially. This gives me reason for cautious optimism. She has been to see her  rheumatologist, there is nothing that can be done for this type of soft tissue calcification associated with scleroderma 02/02/16 no debridement although there is a light surface slough. She has 2 peninsulas of skin 1 inferiorly and one medially.  We continue to make a slight and slow but definite progressive here 02/16/16; light surface debridement with more attention to the circumference of the wound bed where the fibrinous eschar is more prevalent. No calcifications detected. She seems to have done nicely with the [facility] with some epithelialization and some improvement in the overall wound volume. 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 Continued attention to the circumference of the wound where the fibrinous eschar in calcinosis or prevalent. No calcifications were detected. She has continued improvement with Hydrofera Blue. The wound is no longer as deep 03/15/16 surgical debridement done to remove surface escha Especially around the circumference of the wound where there is nonviable subcutaneous tissue. In spite of this there is considerable improvement in the overall dimensions and depth of the wound. Islands of epithelialization are seen especially medially inferiorly and superiorly to a lesser extent. She is using Hydrofera Blue at home 03/29/16; surgical debridement done to remove surface eschar and nonviable subcutaneous tissue. This cleans up quite nicely mention slightly larger in terms of length and width however depth is less 04/12/16; continued gradual improvement in terms of depth and the condition of the wound base. Debridement is done. Continuing long standing Hydrofera Blue at home with Kerlix Coban wraps 04/26/16; continued gradual improvement in terms of depth and management as well as condition of the wound base. Surgical debridement done she  continues with Hydrofera Blue. 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 She initially came to this clinic venous insufficiency ulcers which have long since healed 05/17/16 continued improvement in terms of the depth and measurements of this wound although she has a tightly adherent fibrinous slough each time. We've been continued with long standing Hydrofera Blue which seems to done as well for this wound is many advanced treatment options. 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 She also has chronic venous insufficiency. She has an irritation on her lateral right ankle secondary to our wraps 05/10/16; wound appears to be smaller especially on the medial aspect and especially in the width. Wound was debridement surface looks better. She is also been in the hospital apparently with anemia again she tells me she had an endoscopy. Since she got home after 3 days which I believe was sometime last week she has had an irritated painful area on the right lateral ankle surrounding the lateral malleolus 05/31/16; much more adherent surface slough today then recently although I don't think the dimensions of changed that much. A more aggressive debridement is required. The irritated area over her medial malleolus is more pruritic and painful and I don't think represents cellulitis 06/14/16; no major change in her wound dimensions however there is more tightly adherent surface slough which is disappointing. As well as she appears to have a new small area medially. Furthermore an irritated uncomfortable area on the lateral aspect of her right foot just below the lateral malleolus. 06/21/16; I'm seeing the patient back in follow-up for the new areas under her major wound on the right anterior leg. She has been using Hydrofera Blue to this area probably for several months now and although the dressings seem to be helping for quite a period of  time I think things have stagnated lately. She comes in today with a relatively tight adherent surface slough and really no changes in the  wound shape or dimension. The 2 small areas she had inferiorly are tiny but still open they seem improved this well. There is no uncontrolled edema and I don't think there is any evidence of cellulitis. 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 Patient has had vascular evaluations both venous and arterial. I have biopsied this area. There is no obvious infection. The worrisome thing today is that she seems to be developing areas of erythema and epithelial damage on the medial aspect of the right foot. Also to a lesser degree inferior to the actual wound itself. Again I see no obvious changes to suggest cellulitis however as this is the only treatable option I will probably give her antibiotics. 07/13/16 no major change in the lady's large anterior right leg wound. Still covered with a very tightly adherent surface slough which is difficult to debridement. There is less erythema around this, culture last week grew pseudomonas I gave her ciprofloxacin. The area on her lateral right malleolus looks better- 08/02/16 the patient's wounds continued to decline. Her original large anterior right leg wound looks deeper. Still adherent surface slough that is difficult to debridement. She has a small area just below this and a punched-out wound just below her lateral malleolus. In the meantime she is been in hospital with apparently an upper GI bleed on Plavix and aspirin. She is now just on Plavix she received 3 units of packed cells 08/23/16; since I last saw this 3 weeks ago, the open large area on her right leg looks about the same syrup. She has a small satellite lesion just underneath this. The area on her medial right ankle is now a deep necrotic wound. I attempted  to debridement this however there is just too much pain. 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 She follows with vascular surgery and is scheduled for an angiogram in early September 09/06/16; the patient is going for an arteriogram tomorrow. Her original large wound on the right calf is about the same the satellite lesion underneath it is about the same however the area on her medial ankle is now deeper with exposed tendon. I am no longer attempting to debride these wounds 09/20/16; the patient has undergone a right femoral endarterectomy and Dacron patch angioplasty. This seems to have helped the flow in her right leg. 10/04/16; Arrived today for aggressive debridement of the wounds on her right calf the original wound the one beneath it and a difficult area over her right lateral leg just above the lateral malleolus. 10/25/16; her 3 open wounds are about the same in terms of dimensions however the surface appears a lot healthier post debridement. Using Iodoflex 10/18/16 we have been using Iodoflex to her wounds which she tolerated with some difficulty. 10/11/16; has been using Santyl for a period of time with some improvement although again very adherent surface slough would prevent any attempted healing this. She has a original wound on the left calf, the satellite underneath that and the most recent wound on the right medial ankle. She has completed revascularization by Dr. early and has had venous ablation earlier. Want to go back to Iodoflex to see if week and get a healthier surface to this wound bed failing this I think she'll need to be taken to the OR and I am prepared to  call Dr. Marla Roe to discuss this. She is obviously not a good candidate for general anesthesia however.; 11/08/16; I put her on Iodoflex last time to see if I can get the wound bed any healthier and unfortunately today still had tremendous surface  slough. 11/15/16; 4 weeks' worth of Iodoflex with not much improvement. Debridement on the major wounds on her left anterior leg is easier however this does not maintain from week to week. The punched-out area on her medial right ankle 11/29/16; I attempted to change the patient last visit to [facility] however she states this burned and was very uncomfortable therefore we gave her permission to go back to the eye out of complex which she already had at home. Also she noted a lot of pain and swelling on the lateral aspect of her leg before she traveled to [place] for the holidays, I called her in doxycycline over the phone. This seems to have helped 12/06/16; Wounds unchanged by in large. Using Iodoflex 12/13/16; her wounds today actually looks somewhat better. The area on the right lateral lower leg has reasonably healthy-looking granulation and perhaps as actually filled and a bit. Debridement of the 2 wounds on the medial calf is easier and post debridement appears to have a healthier base. We have referred her to Dr. Migdalia Dk for consideration of operative debridement 12/20/16; we have a quick note from Dr. Merri Ray who feels that the patient needs to be referred to an academic center/plastic surgeon. This is due to the complexity of the patient's medical issues as it applies to anything in the OR. We have been using Iodoflex 12/27/16; in general the wounds on the right leg are better in terms of the difficult to remove surface slough. She has been using Iodoflex. She is approved for Apligraf which I anticipated ordering in the next week or 2 when we get a better-looking surface 01/04/16 the deep wounds on the right leg generally look better. Both of them are debrided further surface slough. The area on the lateral right leg was not debrided. She is approved for Apligraf I think I'll probably order this either next week or the week after depending on the surface of the  wounds superiorly. We have been using Iodoflex which will continue until then 01/11/16; the deep wounds on the right leg again have a surface slough that requires debridement. I've not been able to get the wound bed on either one of these wounds down to what would be acceptable for an advanced skin stab-like Apligraf. The area on the medial leg has been improving. We have been using hot Iodoflex to all wounds which seems to do the best at at least limiting the nonviable surface 01/24/17; we have continued Iodoflex and all her wound areas. Her debridement Gen. he is easier and the surface underneath this looks viable. Nevertheless these are large area wounds with exposed muscle at least on the anterior parts. We have ordered Apligraf's for 2 weeks from now. The patient will be away next week 02/07/17; the patient was close to have first Apligraf today however we did not order one. I therefore replaced her Iodoflex. She essentially has 3 large punched- out areas on her right anterior leg and right medial ankle. 02/11/17; Apligraf #1 02/25/17; Apligraf #2. In general some improvement in the right medial ankle and right anterior leg wounds. The larger wound medially perhaps some better 03/11/17; Apligraf #3. In general continued improvement in the right medial ankle and the right anterior  leg wounds. 03/25/17 Apligraf #4. In general continued improvement especially on the right medial ankle and the lower 04/08/17; Apligraf #5 in general continued improvement in all wound sites. 04/22/17; post Apligraf #5 her wound beds continued to look a lot better all of them up to the surface of the surrounding skin. Had a caramel-colored slough that I did not debrided in case there is residual Apligraf effect. The wounds are as good as I have seen these looking quite some time. 04/29/17; we applied [employer] and last week after completing her Apligraf. Wounds look as though they've contracted somewhat although they have  a nonviable surface which was problematic in the past. Apparently she has been approved for further Apligraf's. We applied Iodosorb today after debridement. 05/06/17; we're fortunate enough today to be able to apply additional Apligraf approved by her insurance. In general all of her wounds look better Apligraf #6 05/24/17; Apligraf #7 continued improvement in all wounds 3 06/10/17 Apligraf #8. Continued improvement in the surface of all wounds. Not much of an improvement in dimensions as I might a follow 06/24/17 Apligraf #9 continued general improvement although not as much change in the wound areas I might of like. She has a new open area on the right anterior lateral ankle very small and superficial. She also has a necrotic wound on the tip of her right index finger probably secondary to severe uncontrolled Raynaud's phenomenon. She is already on sildenafil and already seen her rheumatologist who gave her Keflex. 07/08/17; Apligraf #10. Generalized improvement although she has a small additional wound just medial to the major wound area. 07/22/17; after discussion we decided not to reorder any further Apligraf's although there is been considerable improvement with these it hasn't been recently. The major wound anteriorly looks better. Smaller wounds beneath this and the more recent one and laterally look about the same. The area on the right lateral lower leg looks about the same 07/29/17 this is a patient who is exceedingly complex. She has advanced scleroderma, crest syndrome including calcinosis, PAD status post revascularization, chronic venous insufficiency status post ablations. She initially presented to this clinic with wounds on her bilateral lower legs however these closed. More recently we have been dealing with a large open area superiorly on the right anterior leg, a smaller wound underneath this and then another one on the right medial lower extremity. These improve significantly with 10  Apligraf applications. Over the last 2-3 weeks we are making good progress with Hydrofera Blue and these seem to be making progress towards closure 08/19/17; wounds continued to make good improvement with Hydrofera Blue and episodic aggressive debridements 08/26/17; still using Hydrofera Blue. Good improvements 09/09/17; using Hydrofera Blue continued improvement. Area on the lateral part of her right leg has only a small remaining open area. The small inferior satellite region is for all intents and purposes closed. Her major wound also is come in in terms of depth and has advancing epithelialization. 09/16/17; using Hydrofera Blue with continued improvement. The smaller satellite wound. We've closed out today along with a new were satellite wound medially. The area on her medial ankle is still open and her major wound is still open but making improvement. All using Hydrofera Blue. Currently 09/30/17; using Hydrofera Blue. Still a small open area on the lateral right ankle area and her original major wound seems to be making gradual and steady improvement. 10/14/17; still using Hydrofera Blue. Still too small open areas on the right lateral ankle. Her original major  wound is horizontal and linear. The most problematic area paradoxically seems to be the area to the medial area wears I thought it would be the lateral. 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 10/28/17; still using Hydrofera Blue. Right lateral ankle has a very small open area superiorly on the most lateral part of the wound. Her original open wound has 2 open areas now separated by normal skin and we've redefined this. 11/11/17; still using Hydrofera Blue area and right lateral ankle continues to have a small open area on mostly the lateral part of the wound. Her original wound has 2 small open areas now separated by a considerable amount of normal skin 11/28/17; the patient called in  slightly before Thanksgiving to report pain and erythema above the wound on the right leg. In the past this is responded well to treatment for cellulitis and I gave her over the phone doxycycline. She stated this resulted in fairly abrupt improvement. We have been using Hydrofera Blue for a prolonged period of time to the larger wound anteriorly into the remaining wound on her right right lateral ankle. The latter is just about closed with only a small linear area and the bottom of the [place]. 12/02/17; use endoform and left the dressing on since last visit. There is no tenderness and no evidence of infection. 12/16/17; patient has been using Endoform but not making much progress. The 2 punched out open areas anteriorly which were the reminiscence of her major wound appear deeper. The area on the lateral aspect of her right calf also appears deeper. Also she has a puzzling tender swelling above her wound on the right leg. This seems larger than last time. Just above her wounds there appears to be some fluctuance in this area it is not erythematous and there is no crepitus 12/30/17; patient has been using Endoform up until last week we used Hydrofera Blue. Ultrasound of the swelling above her 2 major wounds last time was negative for a fluid collection. I gave her cefaclor for the erythema and tenderness in this area which seems better. Unfortunately both punched out areas anteriorly and the area on her right medial lower leg appear deeper. In fact the lateral of the wounds anteriorly actually looks as though it has exposed tendon and/or muscle sheath. She is not systemically unwell. She is complaining of vaginitis type symptoms presumably Candida from her antibiotics. 01/06/18; we're using santyl. she has 2 punched out areas anteriorly which were initially part of a large wound. Unfortunately medially this is now open to tendon/muscle. All the wounds have the same adherent very difficult to debride  surface. 01/20/18; 2 week follow-up using Santyl. She has the 2 punched out areas anteriorly which were initially part of her large surface wound there. Medially this still has exposed muscle. All of these have the same tightly adherent necrotic surface which requires debridement. PuraPly was not accepted by the patient's insurance however her insurance I think it changed therefore we are going to run Apligraf to gain 02/03/18; the patient has been using Santyl. The wound on the right lateral ankle looks improved and the 2 areas anteriorly on the right leg looks about the same. The medial one has exposed muscle. The lateral 1 requiresdebridement. We use PuraPly today for the first week 02/10/18; PuraPly #2. The patient has 3 wounds. The area on the right lateral ankle, 2 areas anteriorly that were part of her original large wound in  this area the medial one has exposed muscle. All of the wounds were lightly debrided with a number 3 curet. PuraPly #2 applied the lateral wound on the calf and the right lateral ankle look better. 02/17/18; PuraPly #3. Patient has 3 wounds. The area on the right lateral ankle in 2 areas internally that were part of her original large wound. The lateral area has exposed muscle. She arrived with some complaints of pain around the right ankle. 02/24/18; PuraPly #4; not much change in any of the 3 wound areas. Right lateral ankle, right lateral calf. Both of these required debridement with a #3 curet. She tolerates this marginally. The area on the medial leg still has exposed muscle. Not much change in dimensions 03/03/18 PuraPly #5. The area on the medial ankle actually looks better however the 2 separate areas that were original parts of the larger right anterior leg wound look as though they're attempting to coalesce. 03/10/18; PuraPly #6. The area on the medial ankle actually continues to look and measures smaller however the 2 separate areas that were part of the original large  wound on the right anterior leg have now coalesced. There hasn't been much improvement here. The lateral area actually has underlying exposed muscle 03/17/18-she is here in follow-up evaluation for ulcerations to the right lower extremity. She is voicing no complaints or concerns. She tolerated debridement. Puraply#7 placed 03/24/18; difficult right lower extremity ulcerations. PuraPly #8 place. She is been approved for [employer]. She did very well with Apligraf today however she is apparently reached her "lifetime max" 03/31/18; marginal improvement with PuraPly although her wounds looked as good as they have in several weeks today. Used TheraSkins #1 04/14/18 TheraSkin #2 today 04/28/18 TheraSkins #3. Wound slightly improved 05/12/18; TheraSkin skin #4. Wound response has been variable. 05/27/18 TheraSkin #5. Generally improvement in all wound areas. I've also put her in 3 layer compression to help with the severe venous hypertension 06/09/18; patient is done quite well with the TheraSkins unfortunately we have no further applications. I also put her in 3 layer compression last week and that really seems to of helped. 06/16/18; we have been using silver collagen. Wounds are smaller. Still be open area to the muscle layer of her calf however even that is contracted somewhat. She tells me that at night sometimes she has pain on the right lateral calf at the site of her lower wound. Notable that I put her into 3 layer compression about 3 weeks ago. She states that she dangles her leg over the bed that makes it feel better but she does not describe claudication during the day She is going to call her secondary insurance to see if they will continue to cover advanced treatment products I have reviewed her arterial studies from 01/22/17; this showed an ABI in the right of 1 and on the left noncompressible. TBI on the right at 0.30 on the left at 0.34. It is therefore possible she has significant PAD with medial  calcification falsely elevating her ABI into the normal range. I'll need to be careful about asking her about this next week it's possible the 3 layer compression is too much 06/23/18; was able to reapply TheraSkin 1 today. Edema control is good and she is not complaining of pain no claudication 07/07/18;no major change. New wound which was apparently a taper removal injury today in our clinic between her 2 wounds on the right calf TheraSkins #2 07/14/18; I think there is some improvement in the right  lateral ankle and the medial part of her wound. There is still exposed muscle medially. 07/28/18; two-week follow-up. TheraSkins#3. Unfortunately no major change. She is not a candidate I don't think for skin grafting due to severe venous hypertension associated with her scleroderma and pulmonary hypertension 08/11/18 Patient is here today for her [employer] application #43 (#5 of the second set). She seems to be doing well and in the base of the wound appears to show some progress at this point. This is the last approved [employer]askin of the second set. 08/25/18; she has completed TheraSkin. There has been some improvement on the right lateral calf wound as well as the anterior leg wounds. The open area to muscle medially on the anterior leg wound is smaller. I'm going to transition her back to [facility] under Kerlix Coban change every second day. She reports that she had some calcification removed from her right upper arm. We have had previous problems with calcifications in her wounds on her legs but that has not happened recently 09/08/18;using Hydrofera Blue on both her wound areas. Wounds seem to of contracted nicely. She uses Kerlix Coban wrap and changes at home herself 09/22/2018; using Hydrofera Blue on both her wound areas. Dimensions seem to have come down somewhat. There is certainly less depth in the medial part of the mid tibia wound and I do not think there is any exposed muscle at this  point. 10/06/2018; 2-week follow-up. Using [facility] on her wound areas. Dimensions have come down nicely both on the right lateral ankle area in the right mid tibial area. She has no new complaints 10/20/2018; 2-week follow-up. She is using Hydrofera Blue. Not much change from the last time she was here. The area on the lateral ankle has less depth although it has raised edges on one side. I attempted to remove as much of the raised edge as I could without creating more additional wounds. The area on the right anterior mid tibia area looks the same. 11/03/2018; 2-week follow-up she is using Hydrofera Blue. On the right anterior leg she now has 2 wounds separated by a large area of normal skin. The area on the medial part still has I think exposed muscle although this area itself is a lot smaller. The area laterally has some depth. Both areas with necrotic debris. The area on her right lateral ankle has come in nicely 11/17/2018; patient continues to use Hydrofera Blue. We have been increasing separation of the 2 wounds anteriorly which were at one point joint the area on the right lateral calf continues to have I think some improvement in depth. 12/08/2018; patient continues to use Hydrofera Blue. There is some improvement in the area on the right lateral calf. The 2 areas that were initially part of the original large wound in the mid right tibia are probably about the same. In fact the medial area is probably somewhat larger. We will run puraply through the patient's insurance 12/22/2018; she has been using Hydrofera Blue. We have small wounds on the right lateral calf and 2 small areas that were initially part of a large wound in the right mid tibia. We applied pure apply #1 today. 12/29/2018; we applied puraply #2. Her wounds look somewhat better especially on the right lateral calf and the lateral part of her original wound in the mid tibial area. 01/05/2019; perhaps slightly improved in  terms of wound bed condition but certainly not as much improvement as I might of liked. Puraply #3 1/13:  we did not have a correct sized puraply to apply. wounds more pinched out looking, I increased her compression to 3 layer last week to help with significant multilevel venous hypertension. Since then I've reviewed her arterial status. She has a right femoral endarterectomy and a distal left SFA stent. She was being followed by Dr. [name] for a period however she does not appear to have seen him in 3 years. I will set up an appointment. 1/20. The patient has an additional wound on the right lateral calf between the distal wound and proximal wounds. We did not have Puraply last week. Still does not have a follow-up with Dr. early 1/27: Follow-up with Dr. early has been arranged apparently with follow-up noninvasive studies. Wounds are measuring roughly the same although they certainly look smaller 2/3; the patient had non invasive studies. Her ABIs on the right were 0.83 and on the left 1.02 however there was no great toe pressure bilaterally. Also worrisome monophasic waveforms at the PTA and dorsalis pedis. We are still using Puraply. We have had some improvement in all of the wounds especially the lateral part of the mid tibial area. 2/10; sees Dr. early of vein and vascular re-arterial studies next week. Puraply reapplied today. 2/17; Dr. early of vein and vascular his appointment is tomorrow. Puraply reapplied after debridement of all wounds 2/24; the patient saw Dr. early I reviewed his note. sHe noted the previous right femoral endarterectomy with a Dacron patch. He also noted the normal ABI and the monophasic waveforms suggesting tibial disease. Overall he did not feel that she had any evidence of arterial insufficiency that would impair her wound healing. He did note her venous disease as well. He suggested PRN follow-up. 3/2; I had the last puraply applied today. The original wounds over  the mid tibia area are improved where is the area on the right lower leg is not 3/9; wounds are smaller especially in the right mid tibia perhaps slightly in the right lateral calf. We finished with puraply and went to endoform today 3/23; the patient arrives after 2 weeks. She has been using endoform. I think all of her wounds look slightly better which includes the area on the right lateral calf just above the right lateral malleolus and the 2 in the right mid tibia which were initially part of the same wound. 4/27; TELEHEALTH visit; the patient was seen for telehealth visit today with her consent in the middle of the worldwide epidemic. Since she was last here she called in for antibiotics with pain and tenderness around the area on the right medial ankle. I gave her empiric doxycycline. She states this feels better. She is using endoform on both of these areas 5/11 TELEHEALTH; the patient was seen for telehealth visit today. She was accompanied at home by her husband. She has severe pulmonary hypertension accompanied scleroderma and in the face of the Covid epidemic cannot be safely brought into our clinic. We have been using endoform on her wound areas. There are essentially 3 wound areas now in the left mid tibia now 2 open areas that it one point were connected and one on the right lateral ankle just above the malleolus. 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 She cannot come to clinic. The wounds were reviewed today via telehealth. She has severe chronic venous  hypertension which I think is centrally mediated. She has wounds on her right anterior tibia and right lateral ankle area. These are chronic. She has been using endoform. 6/8; TELEHEALTH; this is a patient with severe pulmonary hypertension secondary to  scleroderma on chronic oxygen she cannot come to the clinic in the face of the Covid epidemic. We have been following her from telehealth. She has severe chronic venous hypertension which may be mostly centrally mediated secondary to right heart heart failure. She has wounds on her right anterior tibia and right lateral ankle these are chronic we have been using endoform 6/22; TELEHEALTH; this patient was seen today via telehealth. 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 Since the last time we had contact with this patient she developed some pain and erythema around the wound on her right lateral malleolus/ankle and we put in antibiotics for her. This is resulted in good improvement with resolution of the erythema and tenderness. I changed her to silver alginate last time. We had been using endoform for an extended period of time 7/13; TELEHEALTH; this patient was seen today via telehealth. 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 She would continue to be at a prohibitive risk to be brought into our clinic unless this was absolutely necessary. These visits have been done with her approval as well as her husband. We have been using silver alginate to the areas on the right mid tibia and right lateral lower leg. 7/27 TELEHEALTH; patient was seen along with her husband today via telehealth. 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 We changed her to sample last visit. She has 2 areas a chronic wound on her right mid tibia and one just above her ankle. These were not the original wounds when she came into this clinic but she developed them during treatment 8/17; she comes in for her first face-to-face visit in a long period. She has a remaining area just medial to the right tibia which is  the last open part of her large wound across this area. She also has an area on the right lateral lower leg. We prescribed Santyl last telehealth visit but they were concerned that this was making a deeper so they put silver alginate on it last week. Her husband changes the dressings. 8/31; using Santyl to the 2 wound areas some improvement in wound surfaces. Husband has surgery in 2 weeks we will put her out 3 weeks. Any of the advanced treatment options that I can think of that would be eligible for this wound would also cause her to have to come in weekly. The risk that the patient is just too high 9/21. Using Santyl to the 2 wound areas. Both of these are somewhat better although the medial mid tibia area still has exposed muscle. Lateral ankle requiring debridement. Using Santyl 10/12; using Santyl to the 2 wound areas. One on the right lateral ankle and the other in the medial calf which still has exposed muscle. Both areas have come down in size and have better looking surfaces. She has made nice progress with santyl 11/2; we have been using Santyl to the 2 wound areas. Right lateral ankle and the other in the mid tibia area the medial part of this still has exposed muscle. 11/23/2019 on evaluation today patient appears  to be doing about the same really with regard to her wounds. She is actually not very pleased with how things seem to be progressing at this point she tells me that she really has not noted much improvement unfortunately. With that being said there is no signs of active infection at this time. There is some slough buildup noted at this time which again along with some dry skin around the edges of the wound I think would benefit her to try to debride some of this away. Fortunately her pain is doing fairly well. She still has exposed muscle in the right medial/tibial area. 12/14; TELEHEALTH; she was changed to [facility] to the right calf wound and right lateral ankle wound  when she was here last time. Unfortunately since then she had a fall with a pelvic fracture and a fracture of her wrist. She was apparently hospitalized for 5 days. I have not looked at her discharge summary. She apparently came out of the hospital with a blister on her right heel. She was seen today via telehealth by myself and our case manager. The patient and her husband were present. She has been using Hydrofera Blue at our direction from the last time she was in the clinic. There is been no major improvement in fact the areas appear deeper and with a less viable surface 01/04/2020; TELEHEALTH; the patient was seen today in accompaniment of her husband and our nurse. She has 3 open areas 1 on the right medial mid tibia, one on the right lateral ankle and a large eschared pressure area on the right heel. We have been using Iodoflex to the 2 original wounds. 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 It is simply too perilous for her to be seen in any other way 1/26; TELEHEALTH; the patient was seen today in accompaniment of her husband and our RN one of our nurses. She still has the 3 open areas 1 on the right medial mid tibia which is the remanent of a more extensive wound in this area, one on the right lateral ankle and a large eschared pressure area on the right heel. We have been using Iodoflex to the 2 original wounds and a bed at 9 application to the eschared area on the heel 2/15; the first time we have seen this patient and then several months out of concern for the pandemic. She had a large horizontal wound in the mid tibia. Only the medial aspect of this is still open. Area on the lateral ankle is just about closed. She had a new pressure ulcer on the right heel which I have removed some of the eschar. We have been using Iodoflex which I will continue. The area in the mid tibia has a round circle in the middle of exposed muscle. I think we would have to use  an advanced treatment product to stimulate granulation over this area. We will run this through her insurance. She is not eligible for plastic surgery for many reasons 3/1; TELEHEALTH. The patient was seen today by telehealth. 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 We have been using Iodoflex to the wound areas which include the right anterior mid tibia, right lateral ankle and the right heel. All of these were reviewed 3/8; the patient's wound just above the right ankle is closed. She still has a contracting black eschar on the heel where she had  a pressure ulcer. The medial part of her original wound on the mid tibia has exposed muscle. We have made really made no progress in this area although we have managed to get a lot of the original wound in this area to close. We used Apligraf #1 today 3/22; the patient's ankle wound remains closed. She still has a contracting black eschar on the heel although it seems to have less surface area. It still not clear whether there is any depth here. We used Apligraf #2 today in an attempt to get granulation over the exposed muscle and what is left of her mid tibia area. 4/5; everything is closed except the medial aspect of the mid tibia wound, as well as the pressure injury on the right heel. She has been using Betadine to the right heel which has been gradually contracting. We used Apligraf #3 today. 4/19; Apligraf #4. Still a pressure area on the right heel she has been using Betadine superficial excoriated areas she has been applying salicylic acid to on the anterior leg and the thick horn on the leg just above her wound area 5/3; Apligraf #5. Unfortunately she came in today with a reopening of the area on the right lateral lower leg. Not much change in the wound we have been treating in the mid calf. Quite a bit of edema surrounding this wound. She reports  that she was up on her feet quite a bit. The area on the right heel is separating she has been using Betadine She comes in with a new wound on the left lateral malleolus which is very disappointing this is been open since last week 5/17; we applied her last Apligraf last time. Unfortunately that did not have any effect on the deep area medially in the mid tibial area. However the area over the right lateral malleolus is a lot better. Right heel also is contracted we used Iodoflex last time. The new wound on the left lateral malleolus were using Santyl to. This required debridement. 6/1. Not much change in the area on the right medial mid tibia. Right lateral ankle is improved she has the necrotic area on the right heel which was a pressure area. New area on the left lateral malleolus from last time. I changed her to Iodoflex to the area on the left lateral malleolus she said this hurt I have put her back on Santyl 6/15; right mid tibia is unchanged. I do not think there is anything I can do to this topically that we will get this to granulate over the muscle. And I am furthermore I am not sure that she is a surgical candidate either because of her severe pulmonary issues or because she really does not want to go through it. The area on the right lateral malleolus is a small punched out area. The area on the left lateral malleolus again small punched out with nonviable surface Pressure ulcer on the right heel at separating black eschar. I went ahead and remove this today she has an area on the left anterior mid tibia just laterally. Geographic wounds debris on the surface. Some erythema here. 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 There is skin changes widely Change the primary dressing to Sorbact to all wound areas. She needs compression on the left leg as well as the right 6/21; right mid tibia which was her one remaining wound at 1 point is unchanged The area on the  right lateral malleolus actually is close to  closing over still a small punched out area The area on the left lateral malleolus again a deep wound it is this 1 that she feels pain in. Pressure ulcer on the right heel was cultured today. New area on the left anterior mid tibia several geographic wounds last week in the setting of chronic venous insufficiency this actually looks some better 7/6; Right mid tibia about the same. Exposed muscle Left lateral malleolus again necrotic debris over the surface. Pressure ulcer on the right heel. She finished the Keflex we prescribed. Necrotic debris debrided with a #3 curette The rest of her wounds on the right mid tibia right lateral ankle are better Her husband reminds me that she had a right femoral endarterectomy and has 2 stents in her left thigh placed in 2011 approximately by vascular surgery in [place]. She saw Dr. [name] last in February 2020. At that point he did not think that the arterial insufficiency she had on the right was sufficient to explain any of her right lower extremity wounds however she now has an area on the left lateral malleolus that looks like an ischemic wound 7/12; we have been using Sorbact all wound areas Right mid tibia about the same exposed muscle Left lateral malleolus small wound with debris in the surface punched out Pressure ulcer of the right heel requiring debridement of necrotic debris Lateral ankle wound on the right is just about closed Right mid tibia wound about the same still with exposed muscle. 7/26 really no change in any wounds. We have been using Sorbact. She has bilateral lower extremity wounds. This is in the setting of scleroderma, severe venous hypertension. She also has known PAD and I had really hoped to be able to get a review by Dr. [name] but apparently that is not booked until August 31. I have asked her to call back to vein and vascular and get an appointment with somebody a little earlier if  that is possible. 8/16; patient's area on the right ankle which was a chronic wound is closed over again. The area on the right mid lower leg, right heel left lateral ankle are all about the same. Pressure ulcer on the right heel is still not viable. New areas identified on the right lateral calf. She has a follow-up with Dr. [name] on 8/31. Phenomenally I would like him to go over her arterial status with regards to her underlying stents. 9/13; patient had her ARTERIAL studies done however apparently Dr. [name] is now in [place] so she did not get an appointment with him on 8/31 and was not referred to any other provider. On the right side her PTA was monophasic. Noncompressible. TBI on the right at 0.41 on the left she was monophasic and noncompressible they did not do a TBI because she had a Band-Aid on her big toe. Patient does not describe claudication although she is having a lot of pain in the left lateral malleolus. 10/11; the patient had a right-sided interventions on 10/5. She had a angioplasty of the right anterior tibial artery as well as the tibioperoneal trunk/peroneal artery she had a drug-coated balloon angioplasty of the right superficial femoral artery. On the left she did not have any interventions yet but is going to have another look at this in 2 weeks. The superficial femoral artery on the left and the associated stent are patent popliteal artery is patent to the dominant vessel runoff is the anterior tibial which has several high-grade stenosis in the proximal one  third. We have been using Sorbact. She also has a new area on the right upper mid tibia. 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 10/10/2020 12:30 PM Medical Record Number: [id number] Patient Account Number: [id number] Date of Birth/Sex: Treating RN: June 21, 1951 (69 y.o. [name] Primary Care Provider: [name] Other Clinician: Referring Provider: Treating Provider/Extender: [name] in Treatment: 27 Constitutional Patient is hypertensive.. Pulse regular and within target range for patient.Marland Kitchen Respirations regular, non-labored and within target range.. Temperature is normal and within the target range for the patient.Marland Kitchen Appears in no distress. Cardiovascular Perhaps faintly in the right. Notes Wound exam; midportion of the right mid tibia still exposed muscle aggressive debridement with a #3 curette above this a dermatologic biopsy site covered in adherent debris which I also debrided. Right plantar heel wound is come down in depth although there is still a small crevice here. The right lateral ankle I think is just above closed On the left lateral malleolus a small open wound with debris on the surface I did not debride this Electronic Signature(s) Signed: 10/10/2020 5:03:49 PM By: Linton Ham MD Entered By: Linton Ham on 10/10/2020 13:44:17 -------------------------------------------------------------------------------- Physician Orders Details Patient Name: Date of Service: [name] [name], [name] 10/10/2020 12:30 PM Medical Record Number: [id number] Patient Account Number: [id number] Date of Birth/Sex: Treating RN: July 21, 1951 (69 y.o. 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 Dressing Change Frequency Change Dressing every other day. - all wounds Skin Barriers/Peri-Wound Care Moisturizing lotion - both legs Wound Cleansing May shower with protection. Primary Wound Dressing Wound #16 Right Calcaneus Iodoflex Wound #19 Right,Lateral Malleolus Iodoflex Wound #20 Left,Lateral Malleolus Iodoflex Wound #23 Right,Posterior Lower Leg Iodoflex Wound #24 Right,Anterior Lower Leg Iodoflex Wound #25 Left,Lateral Foot Iodoflex Wound #5 Right,Medial Lower Leg Iodoflex Secondary Dressing Dry Gauze - all wounds Edema Control Kerlix and Coban - Bilateral Avoid standing for long periods of time Elevate legs to the level of the heart or above for 30 minutes daily and/or when sitting, a frequency of: - throughout the day Off-Loading Turn and reposition every 2 hours Other: - float heels off of bed/chair with pillow under calves Electronic Signature(s) Signed: 10/10/2020 5:03:49 PM By: Linton Ham MD Signed: 10/10/2020 5:10:40 PM By: Levan Hurst RN, BSN Entered By: Levan Hurst on 10/10/2020 13:38:01 -------------------------------------------------------------------------------- Problem List Details Patient Name: Date of Service: [name] [name], [name] 10/10/2020 12:30 PM Medical Record Number: [id number] Patient Account Number: [id number] Date of Birth/Sex: Treating RN: 08/23/1951 (69 y.o. 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 02/24/2015 No Yes I87.331 Chronic venous hypertension (idiopathic) with ulcer and inflammation of right 10/04/2016 No Yes lower extremity L89.616 Pressure-induced deep tissue damage of right heel 12/14/2019 No Yes L97.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 05/31/2020 No Yes Inactive Problems ICD-10 Code Description Active Date Inactive Date 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 10/10/2020 12:30 PM Medical Record Number: [id number] Patient Account Number: [id number] Date of Birth/Sex: Treating RN: [birth year] (69 y.o. [name] Primary Care Provider: [name] Other Clinician: Referring Provider: Treating Provider/Extender: [name] in Treatment: 446 Subjective History of Present Illness (HPI) this is a patient who initially  came to [place] for wounds on the medial malleoli bilaterally as well as her upper medial lower extremities bilaterally. These wounds eventually healed with assistance of Apligraf's bilaterally. While this was occurring she developed the current wound which opened into a fairly substantial wound on the right lower extremity. These are mostly secondary to venous stasis physiology however the patient also has underlying scleroderma, pulmonary hypertension. The wound has been making good progress lately with the Hydrofera Blue-based dressing. 03/17/2015; patient had a arterial evaluation a year ago. Her right ABI was 0.86 left was 1.0. T brachial index was 0.41 on the right 0.45 on the left. Her oe bilateral common femoral artery waveforms were triphasic. Her white popliteal posterior tibial artery and anterior tibial artery waveforms were monophasic with good amplitude. Luteal artery waveforms were biphasic it was felt that her bilateral great toe pressures are of normal although adequate for tissue healing. 03/24/2015. The condition of this wound is not really improved that. He was covered with as fibrinous surface slough and eschar. This underwent an aggressive debridement with both a curette and scalpel. I still cannot really get down to what I can would consider to be a viable surface. There is no evidence of infection. Previous workup for ischemia roughly a year ago was negative nevertheless I think that continues to be a concern 04/07/15. The patient arrived for application of second Apligraf. Once again the surface of this wound is certainly less viable than I would like for an advanced treatment option. An aggressive debridement was done. She developed some arteriolar bleeding which required that along pressure and silver alginate. 04/21/15: change in the condition of this wound. Once again it is covered in a gelatinous surface slough. After debridement today surface of the wound looks somewhat better but  now a heeling surface 05/05/15 Apligraf #4 applied.Still a lot of slough on this wound. 05/19/15 Apligraf #5 applied. Still a lot of slough on this wound I did not aggressively remove this 06/02/15; continued copious amounts of surface slough.  This debridement fairly easily. 06/08/2015 -- the last time she had a venous duplex study done was over 3 years ago and the surgery was prior to that. I have recommended that she sees Dr. early for a another opinion regarding a repeat venous duplex and possibly more endovenous ablation of vein stripping of micro-phlebotomies. 06/16/15; wound has a gelatinous surface eschar that the debridement fairly easily to a point. I don't disagree with the venous workup and perhaps even arterial re-evaluation. She is on prednisone 5 mg and continue his medications for her pulmonary artery hypertension I am not sure if the latter have any wound care healing issues I would need to investigate this. 06/23/15 continues with a gelatinous surface eschar with of fibrinous underlying. What I can see of this wound does not look unhealthy however I just can't get this material which I think is 2 different layers off. Empiric culture done 06/30/15; unfortunately not a lot of change in this wound. A gelatinous surface eschar is easily removed however it has a tight fibrinous surface underneath the. 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 The wound comes back and basically and unchanged state the. She has a gelatinous surface that is more easily removed however there is a tightly fibrinous surface underneath the. There is no evidence of infection. She has a vascular follow-up next month. I would have to inject her in order to do a more aggressive debridement of this area 07/21/15 the wound is roughly in the same state albeit the debridement is done with greater ease. There is less of the fibrinous eschar underneath the. There is no evidence of infection. She has follow-up  with vascular surgery next week. No evidence of surrounding infection. Her original distal wounds healed while this one formed. 07/28/15; wound is easier to debride. No wound erythema. She is seeing Dr. [name] next week. 08/18/15 Has been to [facility] for repeat ablation. Have been using medihoney pad with some improvement 08/25/15; absolutely no change in the condition of this wound in either its overall size or surface condition in many months now. At one point I had this down to [place] healthier surface I think with [facility] however this did not actually progress towards closure. Do not believe that the wound has actually deteriorated in terms of volume at all. We have been using a medihoney pad which allows easier debridement of the gelatinous eschar but again no overall actual improvement. the patient is going towards an ablation with pain and vascular which I think is scheduled for next week. The only other investigation that I could first see would be a biopsy. She does have underlying scleroderma 09/02/15 eschar is much easier to debridement however the base of this does not look particularly vibrant. We changed to Iodoflex. The patient had her ablation earlier this week 09/09/15 again the debridement over the base of this wound is easier and the base of the wound looks considerably better. We will continue the Iodoflex. Dr. Tawni Millers has expressed his satisfaction with the result of her ablation 09/15/15 once again the wound is relatively free of surface eschar. No debridement was done today. It has a pale-looking base to it. although this is not as deep as it once was it seems to be expanding especially inferiorly. She has had recent venous ablation but this is no closer to healing.I've gone ahead and done a punch biopsy this from the inferior part of the wound close to normal skin  09/22/15: the wound is relatively free of surface eschar. There is some surrounding eschar. I'm not exactly sure at  what level the surface is that I am seeing. 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 No evidence of atypical infection, malignancy. 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 No debridement was done 09/29/15; the wound surface is relatively free of surface eschar. There is eschar surrounding the walls of the wound. Aside from the improvement in the amount of surface slough. This wound has not progressed any towards closure. There is not even a surface that looks like there at this is ready. There is no evidence of any infection nor maligancy based on biopsy I did on 9/15. I continued with the Iodoflex however I am looking towards some alternative to try to promote some closure or filling in of this surface. Consider triple layer [place]. Collagen did not result in adequate control of the surface slough 10/13/15; the patient was in hospital last week with severe anemia. The wound looks somewhat better after debridement although there is widening medially. There is no evidence of infection. 10/20/15; patient's wound on the right lateral lower leg is essentially unchanged. This underwent a light surface debridement and in general the debridement is easier and the surface looks improved. I noted in doing this on the side of the wound what appears to be a piece of calcium deposition. The patient noted that she had previous things on the tips of her fingers. 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 She follows with rheumatology and I have asked them to talk to her about this. In view of that S the nonhealing ulcer may have something to do with calcinosis and also unrelieved Raynaud's disease in this area. I should note that her original wounds on the right and left medial malleolus and the inner aspects of both legs just below the knees did however heal over 10/30/15; the  patient's wound on the right lateral lower leg is essentially unchanged. I was able to remove some calcified material from the medial wound edge. 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 Otherwise the wound appears essentially unchanged there is less adherent surface eschar. Some of the calcified material was sent to pathology for analysis 11/17/15. The patient's wound on the right lateral leg is essentially unchanged. [place] Medially. 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 There is some surface slough but I did not debridement this. No evidence of surrounding infection. 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 This will make healing difficult 12/15/15; the wound has less depth surface has a fibrinous slough and calcifications in the wound edge no evidence of infection 12/22/15; the wound definitely has less Fibrinous slough on the base. Calcifications around the wound edges are still evident. Although the wound bed looks healthier it is still pale in appearance. Previous biopsy did not show malignancy 01/04/15; surgical debridement of nonviable slough and subcutaneous tissue the wound cleans up quite nicely but appears to be expanding outward calcifications around the wound edges are still there. 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 She is to see  her rheumatologist I'll see if he has any opinions on this. My punch biopsy done in September did not show calcifications although these are clearly evident. 01/19/16 light selective debridement of nonviable surface slough. There is epithelialization medially. This gives me  reason for cautious optimism. She has been to see her rheumatologist, there is nothing that can be done for this type of soft tissue calcification associated with scleroderma 02/02/16 no debridement although there is a light surface slough. She has 2 peninsulas of skin 1 inferiorly and one medially. We continue to make a slight and slow but definite progressive here 02/16/16; light surface debridement with more attention to the circumference of the wound bed where the fibrinous eschar is more prevalent. No calcifications detected. She seems to have done nicely with the [facility] with some epithelialization and some improvement in the overall wound volume. 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 Continued attention to the circumference of the wound where the fibrinous eschar in calcinosis or prevalent. No calcifications were detected. She has continued improvement with Hydrofera Blue. The wound is no longer as deep 03/15/16 surgical debridement done to remove surface escha Especially around the circumference of the wound where there is nonviable subcutaneous tissue. In spite of this there is considerable improvement in the overall dimensions and depth of the wound. Islands of epithelialization are seen especially medially inferiorly and superiorly to a lesser extent. She is using Hydrofera Blue at home 03/29/16; surgical debridement done to remove surface eschar and nonviable subcutaneous tissue. This cleans up quite nicely mention slightly larger in terms of length and width however depth is less 04/12/16; continued gradual improvement in terms of depth and the condition of the wound base. Debridement is done. Continuing long standing Hydrofera Blue at home with Kerlix Coban wraps 04/26/16; continued gradual improvement in terms of depth and management as well as  condition of the wound base. Surgical debridement done she continues with Hydrofera Blue. 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 She initially came to this clinic venous insufficiency ulcers which have long since healed 05/17/16 continued improvement in terms of the depth and measurements of this wound although she has a tightly adherent fibrinous slough each time. We've been continued with long standing Hydrofera Blue which seems to done as well for this wound is many advanced treatment options. 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 She also has chronic venous insufficiency. She has an irritation on her lateral right ankle secondary to our wraps 05/10/16; wound appears to be smaller especially on the medial aspect and especially in the width. Wound was debridement surface looks better. She is also been in the hospital apparently with anemia again she tells me she had an endoscopy. Since she got home after 3 days which I believe was sometime last week she has had an irritated painful area on the right lateral ankle surrounding the lateral malleolus 05/31/16; much more adherent surface slough today then recently although I don't think the dimensions of changed that much. A more aggressive debridement is required. The irritated area over her medial malleolus is more pruritic and painful and I don't think represents cellulitis 06/14/16; no major change in her wound dimensions however there is more tightly adherent surface slough which is disappointing. As well as she appears to have a new small area medially.  Furthermore an irritated uncomfortable area on the lateral aspect of her right foot just below the lateral malleolus. 06/21/16; I'm seeing the patient back in follow-up for the new areas under her major wound on the right anterior leg. She has been using Hydrofera Blue to this area probably for several months now and  although the dressings seem to be helping for quite a period of time I think things have stagnated lately. She comes in today with a relatively tight adherent surface slough and really no changes in the wound shape or dimension. The 2 small areas she had inferiorly are tiny but still open they seem improved this well. There is no uncontrolled edema and I don't think there is any evidence of cellulitis. 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 Patient has had vascular evaluations both venous and arterial. I have biopsied this area. There is no obvious infection. The worrisome thing today is that she seems to be developing areas of erythema and epithelial damage on the medial aspect of the right foot. Also to a lesser degree inferior to the actual wound itself. Again I see no obvious changes to suggest cellulitis however as this is the only treatable option I will probably give her antibiotics. 07/13/16 no major change in the lady's large anterior right leg wound. Still covered with a very tightly adherent surface slough which is difficult to debridement. There is less erythema around this, culture last week grew pseudomonas I gave her ciprofloxacin. The area on her lateral right malleolus looks better- 08/02/16 the patient's wounds continued to decline. Her original large anterior right leg wound looks deeper. Still adherent surface slough that is difficult to debridement. She has a small area just below this and a punched-out wound just below her lateral malleolus. In the meantime she is been in hospital with apparently an upper GI bleed on Plavix and aspirin. She is now just on Plavix she received 3 units of packed cells 08/23/16; since I last saw this 3 weeks ago, the open large area on her right leg looks about the same syrup. She has a small satellite lesion just underneath this. The area on  her medial right ankle is now a deep necrotic wound. I attempted to debridement this however there is just too much pain. 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 She follows with vascular surgery and is scheduled for an angiogram in early September 09/06/16; the patient is going for an arteriogram tomorrow. Her original large wound on the right calf is about the same the satellite lesion underneath it is about the same however the area on her medial ankle is now deeper with exposed tendon. I am no longer attempting to debride these wounds 09/20/16; the patient has undergone a right femoral endarterectomy and Dacron patch angioplasty. This seems to have helped the flow in her right leg. 10/04/16; Arrived today for aggressive debridement of the wounds on her right calf the original wound the one beneath it and a difficult area over her right lateral leg just above the lateral malleolus. 10/25/16; her 3 open wounds are about the same in terms of dimensions however the surface appears a lot healthier post debridement. Using Iodoflex 10/18/16 we have been using Iodoflex to her wounds which she tolerated with some difficulty. 10/11/16; has been using Santyl for  a period of time with some improvement although again very adherent surface slough would prevent any attempted healing this. She has a original wound on the left calf, the satellite underneath that and the most recent wound on the right medial ankle. She has completed revascularization by Dr. early and has had venous ablation earlier. Want to go back to Iodoflex to see if week and get a healthier surface to this wound bed failing this I think she'll need to be taken to the OR and I am prepared to call Dr. Marla Roe to discuss this. She is obviously not a good candidate for general anesthesia however.; 11/08/16; I put her on Iodoflex last time to see if I can get the wound bed any  healthier and unfortunately today still had tremendous surface slough. 11/15/16; 4 weeks' worth of Iodoflex with not much improvement. Debridement on the major wounds on her left anterior leg is easier however this does not maintain from week to week. The punched-out area on her medial right ankle 11/29/16; I attempted to change the patient last visit to [facility] however she states this burned and was very uncomfortable therefore we gave her permission to go back to the eye out of complex which she already had at home. Also she noted a lot of pain and swelling on the lateral aspect of her leg before she traveled to [facility] for the holidays, I called her in doxycycline over the phone. This seems to have helped 12/06/16; Wounds unchanged by in large. Using Iodoflex 12/13/16; her wounds today actually looks somewhat better. The area on the right lateral lower leg has reasonably healthy-looking granulation and perhaps as actually filled and a bit. Debridement of the 2 wounds on the medial calf is easier and post debridement appears to have a healthier base. We have referred her to Dr. Migdalia Dk for consideration of operative debridement 12/20/16; we have a quick note from Dr. Merri Ray who feels that the patient needs to be referred to an academic center/plastic surgeon. This is due to the complexity of the patient's medical issues as it applies to anything in the OR. We have been using Iodoflex 12/27/16; in general the wounds on the right leg are better in terms of the difficult to remove surface slough. She has been using Iodoflex. She is approved for Apligraf which I anticipated ordering in the next week or 2 when we get a better-looking surface 01/04/16 the deep wounds on the right leg generally look better. Both of them are debrided further surface slough. The area on the lateral right leg was not debrided. She is approved for Apligraf I think I'll probably order this either  next week or the week after depending on the surface of the wounds superiorly. We have been using Iodoflex which will continue until then 01/11/16; the deep wounds on the right leg again have a surface slough that requires debridement. I've not been able to get the wound bed on either one of these wounds down to what would be acceptable for an advanced skin stab-like Apligraf. The area on the medial leg has been improving. We have been using hot Iodoflex to all wounds which seems to do the best at at least limiting the nonviable surface 01/24/17; we have continued Iodoflex and all her wound areas. Her debridement Gen. he is easier and the surface underneath this looks viable. Nevertheless these are large area wounds with exposed muscle at least on the anterior parts. We have ordered Apligraf's for 2  weeks from now. The patient will be away next week 02/07/17; the patient was close to have first Apligraf today however we did not order one. I therefore replaced her Iodoflex. She essentially has 3 large punched- out areas on her right anterior leg and right medial ankle. 02/11/17; Apligraf #1 02/25/17; Apligraf #2. In general some improvement in the right medial ankle and right anterior leg wounds. The larger wound medially perhaps some better 03/11/17; Apligraf #3. In general continued improvement in the right medial ankle and the right anterior leg wounds. 03/25/17 Apligraf #4. In general continued improvement especially on the right medial ankle and the lower 04/08/17; Apligraf #5 in general continued improvement in all wound sites. 04/22/17; post Apligraf #5 her wound beds continued to look a lot better all of them up to the surface of the surrounding skin. Had a caramel-colored slough that I did not debrided in case there is residual Apligraf effect. The wounds are as good as I have seen these looking quite some time. 04/29/17; we applied Supreme and last week after completing her Apligraf. Wounds look  as though they've contracted somewhat although they have a nonviable surface which was problematic in the past. Apparently she has been approved for further Apligraf's. We applied Iodosorb today after debridement. 05/06/17; we're fortunate enough today to be able to apply additional Apligraf approved by her insurance. In general all of her wounds look better Apligraf #6 05/24/17; Apligraf #7 continued improvement in all wounds o3 06/10/17 Apligraf #8. Continued improvement in the surface of all wounds. Not much of an improvement in dimensions as I might a follow 06/24/17 Apligraf #9 continued general improvement although not as much change in the wound areas I might of like. She has a new open area on the right anterior lateral ankle very small and superficial. She also has a necrotic wound on the tip of her right index finger probably secondary to severe uncontrolled Raynaud's phenomenon. She is already on sildenafil and already seen her rheumatologist who gave her Keflex. 07/08/17; Apligraf #10. Generalized improvement although she has a small additional wound just medial to the major wound area. 07/22/17; after discussion we decided not to reorder any further Apligraf's although there is been considerable improvement with these it hasn't been recently. The major wound anteriorly looks better. Smaller wounds beneath this and the more recent one and laterally look about the same. The area on the right lateral lower leg looks about the same 07/29/17 this is a patient who is exceedingly complex. She has advanced scleroderma, crest syndrome including calcinosis, PAD status post revascularization, chronic venous insufficiency status post ablations. She initially presented to this clinic with wounds on her bilateral lower legs however these closed. More recently we have been dealing with a large open area superiorly on the right anterior leg, a smaller wound underneath this and then another one on the  right medial lower extremity. These improve significantly with 10 Apligraf applications. Over the last 2-3 weeks we are making good progress with Hydrofera Blue and these seem to be making progress towards closure 08/19/17; wounds continued to make good improvement with Hydrofera Blue and episodic aggressive debridements 08/26/17; still using Hydrofera Blue. Good improvements 09/09/17; using Hydrofera Blue continued improvement. Area on the lateral part of her right leg has only a small remaining open area. The small inferior satellite region is for all intents and purposes closed. Her major wound also is come in in terms of depth and has advancing epithelialization. 09/16/17; using  Hydrofera Blue with continued improvement. The smaller satellite wound. We've closed out today along with a new were satellite wound medially. The area on her medial ankle is still open and her major wound is still open but making improvement. All using Hydrofera Blue. Currently 09/30/17; using Hydrofera Blue. Still a small open area on the lateral right ankle area and her original major wound seems to be making gradual and steady improvement. 10/14/17; still using Hydrofera Blue. Still too small open areas on the right lateral ankle. Her original major wound is horizontal and linear. The most problematic area paradoxically seems to be the area to the medial area wears I thought it would be the lateral. 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 10/28/17; still using Hydrofera Blue. Right lateral ankle has a very small open area superiorly on the most lateral part of the wound. Her original open wound has 2 open areas now separated by normal skin and we've redefined this. 11/11/17; still using Hydrofera Blue area and right lateral ankle continues to have a small open area on mostly the lateral part of the wound. Her original wound has 2 small open areas now separated by a  considerable amount of normal skin 11/28/17; the patient called in slightly before Thanksgiving to report pain and erythema above the wound on the right leg. In the past this is responded well to treatment for cellulitis and I gave her over the phone doxycycline. She stated this resulted in fairly abrupt improvement. We have been using Hydrofera Blue for a prolonged period of time to the larger wound anteriorly into the remaining wound on her right right lateral ankle. The latter is just about closed with only a small linear area and the bottom of the [place]. 12/02/17; use endoform and left the dressing on since last visit. There is no tenderness and no evidence of infection. 12/16/17; patient has been using Endoform but not making much progress. The 2 punched out open areas anteriorly which were the reminiscence of her major wound appear deeper. The area on the lateral aspect of her right calf also appears deeper. Also she has a puzzling tender swelling above her wound on the right leg. This seems larger than last time. Just above her wounds there appears to be some fluctuance in this area it is not erythematous and there is no crepitus 12/30/17; patient has been using Endoform up until last week we used Hydrofera Blue. Ultrasound of the swelling above her 2 major wounds last time was negative for a fluid collection. I gave her cefaclor for the erythema and tenderness in this area which seems better. Unfortunately both punched out areas anteriorly and the area on her right medial lower leg appear deeper. In fact the lateral of the wounds anteriorly actually looks as though it has exposed tendon and/or muscle sheath. She is not systemically unwell. She is complaining of vaginitis type symptoms presumably Candida from her antibiotics. 01/06/18; we're using santyl. she has 2 punched out areas anteriorly which were initially part of a large wound. Unfortunately medially this is now open to tendon/muscle. All  the wounds have the same adherent very difficult to debride surface. 01/20/18; 2 week follow-up using Santyl. She has the 2 punched out areas anteriorly which were initially part of her large surface wound there. Medially this still has exposed muscle. All of these have the same tightly adherent necrotic surface which requires debridement. PuraPly was not accepted by the patient's  insurance however her insurance I think it changed therefore we are going to run Apligraf to gain 02/03/18; the patient has been using Santyl. The wound on the right lateral ankle looks improved and the 2 areas anteriorly on the right leg looks about the same. The medial one has exposed muscle. The lateral 1 requiresdebridement. We use PuraPly today for the first week 02/10/18; PuraPly #2. The patient has 3 wounds. The area on the right lateral ankle, 2 areas anteriorly that were part of her original large wound in this area the medial one has exposed muscle. All of the wounds were lightly debrided with a number 3 curet. PuraPly #2 applied the lateral wound on the calf and the right lateral ankle look better. 02/17/18; PuraPly #3. Patient has 3 wounds. The area on the right lateral ankle in 2 areas internally that were part of her original large wound. The lateral area has exposed muscle. She arrived with some complaints of pain around the right ankle. 02/24/18; PuraPly #4; not much change in any of the 3 wound areas. Right lateral ankle, right lateral calf. Both of these required debridement with a #3 curet. She tolerates this marginally. The area on the medial leg still has exposed muscle. Not much change in dimensions 03/03/18 PuraPly #5. The area on the medial ankle actually looks better however the 2 separate areas that were original parts of the larger right anterior leg wound look as though they're attempting to coalesce. 03/10/18; PuraPly #6. The area on the medial ankle actually continues to look and measures smaller however  the 2 separate areas that were part of the original large wound on the right anterior leg have now coalesced. There hasn't been much improvement here. The lateral area actually has underlying exposed muscle 03/17/18-she is here in follow-up evaluation for ulcerations to the right lower extremity. She is voicing no complaints or concerns. She tolerated debridement. Puraply#7 placed 03/24/18; difficult right lower extremity ulcerations. PuraPly #8 place. She is been approved for [employer]. She did very well with Apligraf today however she is apparently reached her "lifetime max" 03/31/18; marginal improvement with PuraPly although her wounds looked as good as they have in several weeks today. Used TheraSkins #1 04/14/18 TheraSkin #2 today 04/28/18 TheraSkins #3. Wound slightly improved 05/12/18; TheraSkin skin #4. Wound response has been variable. 05/27/18 TheraSkin #5. Generally improvement in all wound areas. I've also put her in 3 layer compression to help with the severe venous hypertension 06/09/18; patient is done quite well with the TheraSkins unfortunately we have no further applications. I also put her in 3 layer compression last week and that really seems to of helped. 06/16/18; we have been using silver collagen. Wounds are smaller. Still be open area to the muscle layer of her calf however even that is contracted somewhat. She tells me that at night sometimes she has pain on the right lateral calf at the site of her lower wound. Notable that I put her into 3 layer compression about 3 weeks ago. She states that she dangles her leg over the bed that makes it feel better but she does not describe claudication during the day ooShe is going to call her secondary insurance to see if they will continue to cover advanced treatment products I have reviewed her arterial studies from 01/22/17; this showed an ABI in the right of 1 and on the left noncompressible. TBI on the right at 0.30 on the left at 0.34.  It is therefore possible she has  significant PAD with medial calcification falsely elevating her ABI into the normal range. I'll need to be careful about asking her about this next week it's possible the 3 layer compression is too much 06/23/18; was able to reapply TheraSkin 1 today. Edema control is good and she is not complaining of pain no claudication 07/07/18;no major change. New wound which was apparently a taper removal injury today in our clinic between her 2 wounds on the right calf TheraSkins #2 07/14/18; I think there is some improvement in the right lateral ankle and the medial part of her wound. There is still exposed muscle medially. 07/28/18; two-week follow-up. TheraSkins#3. Unfortunately no major change. 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 She seems to be doing well and in the base of the wound appears to show some progress at this point. This is the last approved [employer] of the second set. 08/25/18; she has completed TheraSkin. There has been some improvement on the right lateral calf wound as well as the anterior leg wounds. The open area to muscle medially on the anterior leg wound is smaller. I'm going to transition her back to [facility] under Kerlix Coban change every second day. She reports that she had some calcification removed from her right upper arm. We have had previous problems with calcifications in her wounds on her legs but that has not happened recently 09/08/18;using Hydrofera Blue on both her wound areas. Wounds seem to of contracted nicely. She uses Kerlix Coban wrap and changes at home herself 09/22/2018; using Hydrofera Blue on both her wound areas. Dimensions seem to have come down somewhat. There is certainly less depth in the medial part of the mid tibia wound and  I do not think there is any exposed muscle at this point. 10/06/2018; 2-week follow-up. Using [facility] on her wound areas. Dimensions have come down nicely both on the right lateral ankle area in the right mid tibial area. She has no new complaints 10/20/2018; 2-week follow-up. She is using Hydrofera Blue. Not much change from the last time she was here. The area on the lateral ankle has less depth although it has raised edges on one side. I attempted to remove as much of the raised edge as I could without creating more additional wounds. The area on the right anterior mid tibia area looks the same. 11/03/2018; 2-week follow-up she is using Hydrofera Blue. On the right anterior leg she now has 2 wounds separated by a large area of normal skin. The area on the medial part still has I think exposed muscle although this area itself is a lot smaller. The area laterally has some depth. Both areas with necrotic debris. ooThe area on her right lateral ankle has come in nicely 11/17/2018; patient continues to use Hydrofera Blue. We have been increasing separation of the 2 wounds anteriorly which were at one point joint the area on the right lateral calf continues to have I think some improvement in depth. 12/08/2018; patient continues to use Hydrofera Blue. There is some improvement in the area on the right lateral calf. The 2 areas that were initially part of the original large wound in the mid right tibia are probably about the same. In fact the medial area is probably somewhat larger. We will run puraply through the patient's insurance 12/22/2018; she  has been using Hydrofera Blue. We have small wounds on the right lateral calf and 2 small areas that were initially part of a large wound in the right mid tibia. We applied pure apply #1 today. 12/29/2018; we applied puraply #2. Her wounds look somewhat better especially on the right lateral calf and the lateral part of her original wound in the mid  tibial area. 01/05/2019; perhaps slightly improved in terms of wound bed condition but certainly not as much improvement as I might of liked. Puraply #3 1/13: we did not have a correct sized puraply to apply. wounds more pinched out looking, I increased her compression to 3 layer last week to help with significant multilevel venous hypertension. Since then I've reviewed her arterial status. She has a right femoral endarterectomy and a distal left SFA stent. She was being followed by Dr. [name] for a period however she does not appear to have seen him in 3 years. I will set up an appointment. 1/20. The patient has an additional wound on the right lateral calf between the distal wound and proximal wounds. We did not have Puraply last week. Still does not have a follow-up with Dr. early 1/27: Follow-up with Dr. early has been arranged apparently with follow-up noninvasive studies. Wounds are measuring roughly the same although they certainly look smaller 2/3; the patient had non invasive studies. Her ABIs on the right were 0.83 and on the left 1.02 however there was no great toe pressure bilaterally. Also worrisome monophasic waveforms at the PTA and dorsalis pedis. We are still using Puraply. We have had some improvement in all of the wounds especially the lateral part of the mid tibial area. 2/10; sees Dr. early of vein and vascular re-arterial studies next week. Puraply reapplied today. 2/17; Dr. early of vein and vascular his appointment is tomorrow. Puraply reapplied after debridement of all wounds 2/24; the patient saw Dr. early I reviewed his note. sHe noted the previous right femoral endarterectomy with a Dacron patch. He also noted the normal ABI and the monophasic waveforms suggesting tibial disease. Overall he did not feel that she had any evidence of arterial insufficiency that would impair her wound healing. He did note her venous disease as well. He suggested PRN follow-up. 3/2; I had the  last puraply applied today. The original wounds over the mid tibia area are improved where is the area on the right lower leg is not 3/9; wounds are smaller especially in the right mid tibia perhaps slightly in the right lateral calf. We finished with puraply and went to endoform today 3/23; the patient arrives after 2 weeks. She has been using endoform. I think all of her wounds look slightly better which includes the area on the right lateral calf just above the right lateral malleolus and the 2 in the right mid tibia which were initially part of the same wound. 4/27; TELEHEALTH visit; the patient was seen for telehealth visit today with her consent in the middle of the worldwide epidemic. Since she was last here she called in for antibiotics with pain and tenderness around the area on the right medial ankle. I gave her empiric doxycycline. She states this feels better. She is using endoform on both of these areas 5/11 TELEHEALTH; the patient was seen for telehealth visit today. She was accompanied at home by her husband. She has severe pulmonary hypertension accompanied scleroderma and in the face of the Covid epidemic cannot be safely brought into our clinic. We have been  using endoform on her wound areas. There are essentially 3 wound areas now in the left mid tibia now 2 open areas that it one point were connected and one on the right lateral ankle just above the malleolus. 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 She cannot come to clinic. The wounds were reviewed today via telehealth. She has severe chronic venous hypertension which I think is centrally mediated. She has wounds on her right anterior tibia and right lateral ankle area. These are chronic. She has been using endoform. 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 We have been following her from telehealth. She has severe chronic venous hypertension which may be mostly centrally mediated secondary to right heart heart failure. She has wounds on her right anterior tibia and right lateral ankle these are chronic we have been using endoform 6/22; TELEHEALTH; this patient was seen today via telehealth. 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 Since the last time we had contact with this patient she developed some pain and erythema around the wound on her right lateral malleolus/ankle and we put in antibiotics for her. This is resulted in good improvement with resolution of the erythema and tenderness. I changed her to silver alginate last time. We had been using endoform for an extended period of time 7/13; TELEHEALTH; this patient was seen today via telehealth. 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 She would continue to be at a prohibitive risk to be brought into our clinic unless this was absolutely necessary. These visits have been done with her approval as well as her husband. We have been using silver alginate to the areas on the right mid tibia and right lateral lower leg. 7/27 TELEHEALTH; patient was seen along with her husband today via telehealth. 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 We changed her to sample last visit. She has 2 areas a chronic wound on her right mid tibia and one just above her ankle. These were not the original wounds when she came into this clinic but she developed them during treatment 8/17; she comes in for her first face-to-face visit in a long period. She has a  remaining area just medial to the right tibia which is the last open part of her large wound across this area. She also has an area on the right lateral lower leg. We prescribed Santyl last telehealth visit but they were concerned that this was making a deeper so they put silver alginate on it last week. Her husband changes the dressings. 8/31; using Santyl to the 2 wound areas some improvement in wound surfaces. Husband has surgery in 2 weeks we will put her out 3 weeks. Any of the advanced treatment options that I can think of that would be eligible for this wound would also cause her to have to come in weekly. The risk that the patient is just too high 9/21. Using Santyl to the 2 wound areas. Both of these are somewhat better although the medial mid tibia area still  has exposed muscle. Lateral ankle requiring debridement. Using Santyl 10/12; using Santyl to the 2 wound areas. One on the right lateral ankle and the other in the medial calf which still has exposed muscle. Both areas have come down in size and have better looking surfaces. She has made nice progress with santyl 11/2; we have been using Santyl to the 2 wound areas. Right lateral ankle and the other in the mid tibia area the medial part of this still has exposed muscle. 11/23/2019 on evaluation today patient appears to be doing about the same really with regard to her wounds. She is actually not very pleased with how things seem to be progressing at this point she tells me that she really has not noted much improvement unfortunately. With that being said there is no signs of active infection at this time. There is some slough buildup noted at this time which again along with some dry skin around the edges of the wound I think would benefit her to try to debride some of this away. Fortunately her pain is doing fairly well. She still has exposed muscle in the right medial/tibial area. 12/14; TELEHEALTH; she was changed to [facility]  to the right calf wound and right lateral ankle wound when she was here last time. Unfortunately since then she had a fall with a pelvic fracture and a fracture of her wrist. She was apparently hospitalized for 5 days. I have not looked at her discharge summary. She apparently came out of the hospital with a blister on her right heel. She was seen today via telehealth by myself and our case manager. The patient and her husband were present. She has been using Hydrofera Blue at our direction from the last time she was in the clinic. There is been no major improvement in fact the areas appear deeper and with a less viable surface 01/04/2020; TELEHEALTH; the patient was seen today in accompaniment of her husband and our nurse. She has 3 open areas 1 on the right medial mid tibia, one on the right lateral ankle and a large eschared pressure area on the right heel. We have been using Iodoflex to the 2 original wounds. 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 It is simply too perilous for her to be seen in any other way 1/26; TELEHEALTH; the patient was seen today in accompaniment of her husband and our RN one of our nurses. She still has the 3 open areas 1 on the right medial mid tibia which is the remanent of a more extensive wound in this area, one on the right lateral ankle and a large eschared pressure area on the right heel. We have been using Iodoflex to the 2 original wounds and a bed at 9 application to the eschared area on the heel 2/15; the first time we have seen this patient and then several months out of concern for the pandemic. She had a large horizontal wound in the mid tibia. Only the medial aspect of this is still open. Area on the lateral ankle is just about closed. She had a new pressure ulcer on the right heel which I have removed some of the eschar. We have been using Iodoflex which I will continue. The area in the mid tibia has a round circle in the  middle of exposed muscle. I think we would have to use an advanced treatment product to stimulate granulation over this area. We will run this through her insurance. She  is not eligible for plastic surgery for many reasons 3/1; TELEHEALTH. The patient was seen today by telehealth. 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 We have been using Iodoflex to the wound areas which include the right anterior mid tibia, right lateral ankle and the right heel. All of these were reviewed 3/8; the patient's wound just above the right ankle is closed. She still has a contracting black eschar on the heel where she had a pressure ulcer. The medial part of her original wound on the mid tibia has exposed muscle. We have made really made no progress in this area although we have managed to get a lot of the original wound in this area to close. We used Apligraf #1 today 3/22; the patient's ankle wound remains closed. She still has a contracting black eschar on the heel although it seems to have less surface area. It still not clear whether there is any depth here. We used Apligraf #2 today in an attempt to get granulation over the exposed muscle and what is left of her mid tibia area. 4/5; everything is closed except the medial aspect of the mid tibia wound, as well as the pressure injury on the right heel. She has been using Betadine to the right heel which has been gradually contracting. We used Apligraf #3 today. 4/19; Apligraf #4. Still a pressure area on the right heel she has been using Betadine superficial excoriated areas she has been applying salicylic acid to on the anterior leg and the thick horn on the leg just above her wound area 5/3; Apligraf #5. Unfortunately she came in today with a reopening of the area on the right lateral lower leg. Not much change in the wound we have been treating in the mid calf.  Quite a bit of edema surrounding this wound. She reports that she was up on her feet quite a bit. The area on the right heel is separating she has been using Betadine She comes in with a new wound on the left lateral malleolus which is very disappointing this is been open since last week 5/17; we applied her last Apligraf last time. Unfortunately that did not have any effect on the deep area medially in the mid tibial area. However the area over the right lateral malleolus is a lot better. Right heel also is contracted we used Iodoflex last time. The new wound on the left lateral malleolus were using Santyl to. This required debridement. 6/1. Not much change in the area on the right medial mid tibia. Right lateral ankle is improved she has the necrotic area on the right heel which was a pressure area. New area on the left lateral malleolus from last time. I changed her to Iodoflex to the area on the left lateral malleolus she said this hurt I have put her back on Santyl 6/15; right mid tibia is unchanged. I do not think there is anything I can do to this topically that we will get this to granulate over the muscle. And I am furthermore I am not sure that she is a surgical candidate either because of her severe pulmonary issues or because she really does not want to go through it. ooThe area on the right lateral malleolus is a small punched out area. ooThe area on the left lateral malleolus again small punched out with nonviable surface ooPressure ulcer on the right heel at separating black eschar.  I went ahead and remove this today [name] [name] has an area on the left anterior mid tibia just laterally. Geographic wounds debris on the surface. Some erythema here. 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 There is skin changes widely Change the primary dressing to Sorbact to all wound areas. She needs compression on the left leg as well as the right 6/21; right mid tibia which was  her one remaining wound at 1 point is unchanged ooThe area on the right lateral malleolus actually is close to closing over still a small punched out area ooThe area on the left lateral malleolus again a deep wound it is this 1 that she feels pain in. ooPressure ulcer on the right heel was cultured today. ooNew area on the left anterior mid tibia several geographic wounds last week in the setting of chronic venous insufficiency this actually looks some better 7/6; ooRight mid tibia about the same. Exposed muscle ooLeft lateral malleolus again necrotic debris over the surface. ooPressure ulcer on the right heel. She finished the Keflex we prescribed. Necrotic debris debrided with a #3 curette ooThe rest of her wounds on the right mid tibia right lateral ankle are better Her husband reminds me that she had a right femoral endarterectomy and has 2 stents in her left thigh placed in 2011 approximately by vascular surgery in [place]. She saw Dr. [name] last in February 2020. At that point he did not think that the arterial insufficiency she had on the right was sufficient to explain any of her right lower extremity wounds however she now has an area on the left lateral malleolus that looks like an ischemic wound 7/12; we have been using Sorbact all wound areas ooRight mid tibia about the same exposed muscle ooLeft lateral malleolus small wound with debris in the surface punched out ooPressure ulcer of the right heel requiring debridement of necrotic debris ooLateral ankle wound on the right is just about closed ooRight mid tibia wound about the same still with exposed muscle. 7/26 really no change in any wounds. We have been using Sorbact. She has bilateral lower extremity wounds. This is in the setting of scleroderma, severe venous hypertension. She also has known PAD and I had really hoped to be able to get a review by Dr. [name] but apparently that is not booked until August 31. I  have asked her to call back to vein and vascular and get an appointment with somebody a little earlier if that is possible. 8/16; patient's area on the right ankle which was a chronic wound is closed over again. The area on the right mid lower leg, right heel left lateral ankle are all about the same. Pressure ulcer on the right heel is still not viable. New areas identified on the right lateral calf. She has a follow-up with Dr. [name] on 8/31. Phenomenally I would like him to go over her arterial status with regards to her underlying stents. 9/13; patient had her ARTERIAL studies done however apparently Dr. [name] is now in [place] so she did not get an appointment with him on 8/31 and was not referred to any other provider. On the right side her PTA was monophasic. Noncompressible. TBI on the right at 0.41 on the left she was monophasic and noncompressible they did not do a TBI because she had a Band-Aid on her big toe. Patient does not describe claudication although she is having a lot of pain in the left lateral malleolus. 10/11; the patient had  a right-sided interventions on 10/5. She had a angioplasty of the right anterior tibial artery as well as the tibioperoneal trunk/peroneal artery she had a drug-coated balloon angioplasty of the right superficial femoral artery. On the left she did not have any interventions yet but is going to have another look at this in 2 weeks. The superficial femoral artery on the left and the associated stent are patent popliteal artery is patent to the dominant vessel runoff is the anterior tibial which has several high-grade stenosis in the proximal one third. We have been using Sorbact. She also has a new area on the right upper mid tibia. 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 Pulse regular and within target range for  patient.Marland Kitchen Respirations regular, non-labored and within target range.. Temperature is normal and within the target range for the patient.Marland Kitchen Appears in no distress. Vitals Time Taken: 12:41 PM, Height: 68 in, Weight: 132 lbs, BMI: 20.1, Temperature: 98.3 F, Pulse: 78 bpm, Respiratory Rate: 18 breaths/min, Blood Pressure: 180/73 mmHg. Cardiovascular Perhaps faintly in the right. General Notes: Wound exam; midportion of the right mid tibia still exposed muscle aggressive debridement with a #3 curette above this a dermatologic biopsy site covered in adherent debris which I also debrided. ooRight plantar heel wound is come down in depth although there is still a small crevice here. ooThe right lateral ankle I think is just above closed ooOn the left lateral malleolus a small open wound with debris on the surface I did not debride this Integumentary (Hair, Skin) Wound #16 status is Open. Original cause of wound was Pressure Injury. The wound is located on the Right Calcaneus. The wound measures 0.5cm length x 1.3cm width x 0.5cm depth; 0.511cm^2 area and 0.255cm^3 volume. There is Fat Layer (Subcutaneous Tissue) exposed. There is no tunneling noted, however, there is undermining starting at 3:00 and ending at 9:00 with a maximum distance of 1cm. There is a medium amount of purulent drainage noted. The wound margin is flat and intact. There is small (1-33%) pink granulation within the wound bed. There is a large (67-100%) amount of necrotic tissue within the wound bed including Adherent Slough. Wound #19 status is Open. Original cause of wound was Gradually Appeared. The wound is located on the Right,Lateral Malleolus. The wound measures 0.3cm length x 0.2cm width x 0.2cm depth; 0.047cm^2 area and 0.009cm^3 volume. There is Fat Layer (Subcutaneous Tissue) exposed. There is no tunneling or undermining noted. There is a small amount of serous drainage noted. The wound margin is flat and intact. There is  no granulation within the wound bed. There is a large (67-100%) amount of necrotic tissue within the wound bed including Adherent Slough. Wound #20 status is Open. Original cause of wound was Gradually Appeared. The wound is located on the Left,Lateral Malleolus. The wound measures 0.8cm length x 0.8cm width x 0.6cm depth; 0.503cm^2 area and 0.302cm^3 volume. There is Fat Layer (Subcutaneous Tissue) exposed. There is no tunneling or undermining noted. There is a medium amount of purulent drainage noted. The wound margin is well defined and not attached to the wound base. There is no granulation within the wound bed. There is a large (67-100%) amount of necrotic tissue within the wound bed including Adherent Slough. Wound #23 status is Open. Original cause of wound was Gradually Appeared. The wound is located on the Right,Posterior Lower Leg. The wound measures 2cm  length x 2cm width x 0.1cm depth; 3.142cm^2 area and 0.314cm^3 volume. There is Fat Layer (Subcutaneous Tissue) exposed. There is no tunneling or undermining noted. There is a small amount of purulent drainage noted. The wound margin is distinct with the outline attached to the wound base. There is no granulation within the wound bed. There is a large (67-100%) amount of necrotic tissue within the wound bed including Adherent Slough. Wound #24 status is Open. Original cause of wound was Gradually Appeared. The wound is located on the Right,Anterior Lower Leg. The wound measures 1.4cm length x 1.1cm width x 0.3cm depth; 1.21cm^2 area and 0.363cm^3 volume. There is no tunneling or undermining noted. There is a medium amount of purulent drainage noted. The wound margin is distinct with the outline attached to the wound base. There is no granulation within the wound bed. There is a large (67-100%) amount of necrotic tissue within the wound bed including Adherent Slough. Wound #25 status is Open. Original cause of wound was Gradually Appeared. The  wound is located on the Left,Lateral Foot. The wound measures 0.6cm length x 0.8cm width x 0.1cm depth; 0.377cm^2 area and 0.038cm^3 volume. There is no tunneling or undermining noted. There is a medium amount of purulent drainage noted. The wound margin is distinct with the outline attached to the wound base. There is no granulation within the wound bed. There is a large (67- 100%) amount of necrotic tissue within the wound bed including Adherent Slough. Wound #5 status is Open. Original cause of wound was Gradually Appeared. The wound is located on the Right,Medial Lower Leg. The wound measures 2cm length x 1.9cm width x 0.3cm depth; 2.985cm^2 area and 0.895cm^3 volume. There is Fat Layer (Subcutaneous Tissue) exposed. There is no tunneling or undermining noted. There is a medium amount of purulent drainage noted. The wound margin is distinct with the outline attached to the wound base. There is small (1-33%) red, pink granulation within the wound bed. There is a large (67-100%) amount of necrotic tissue within the wound bed including Adherent Slough. 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 There was a Excisional Skin/Subcutaneous Tissue Debridement with a total area of 0.65 sq cm performed by Ricard Dillon., MD. With the following instrument(s): Curette to remove Viable and Non-Viable tissue/material. Material removed includes Subcutaneous  Tissue and Slough and after achieving pain control using Other (Benzocaine 20%). No specimens were taken. A time out was conducted at 13:32, prior to the start of the procedure. A Minimum amount of bleeding was controlled with Pressure. The procedure was tolerated well with a pain level of 4 throughout and a pain level of 0 following the procedure. Post Debridement Measurements: 0.5cm length x 1.3cm width x 0.5cm depth; 0.255cm^3 volume. Post debridement Stage noted as Category/Stage III. Character of Wound/Ulcer Post Debridement is improved. Post procedure Diagnosis Wound #16: Same as Pre-Procedure Wound #24 Pre-procedure diagnosis of Wound #24 is a Venous Leg Ulcer located on the Right,Anterior Lower Leg .Severity of Tissue Pre Debridement is: Fat layer  exposed. There was a Excisional Skin/Subcutaneous Tissue Debridement with a total area of 1.54 sq cm performed by Ricard Dillon., MD. With the following instrument(s): Curette to remove Viable and Non-Viable tissue/material. Material removed includes Subcutaneous Tissue and Slough and after achieving pain control using Other (Benzocaine 20%). No specimens were taken. A time out was conducted at 13:32, prior to the start of the procedure. A Minimum amount of bleeding was controlled with Pressure. The procedure was tolerated well with a pain level of 4 throughout and a pain level of 0 following the procedure. Post Debridement Measurements: 1.4cm length x 1.1cm width x 0.3cm depth; 0.363cm^3 volume. Character of Wound/Ulcer Post Debridement is improved. Severity of Tissue Post Debridement is: Fat layer exposed. Post procedure Diagnosis Wound #24: Same as Pre-Procedure Wound #5 Pre-procedure diagnosis of Wound #5 is a Venous Leg Ulcer located on the Right,Medial Lower Leg .Severity of Tissue Pre Debridement is: Fat layer exposed. There was a Excisional Skin/Subcutaneous Tissue Debridement with a total area of 3.8 sq cm performed by Ricard Dillon., MD. With the following instrument(s): Curette to remove Viable and Non-Viable tissue/material. Material removed includes Subcutaneous Tissue and Slough and after achieving pain control using Other (Benzocaine 20%). No specimens were taken. A time out was conducted at 13:32, prior to the start of the procedure. A Minimum amount of bleeding was controlled with Pressure. The procedure was tolerated well with a pain level of 4 throughout and a pain level of 0 following the procedure. Post Debridement Measurements: 2cm length x 1.9cm width x 0.3cm depth; 0.895cm^3 volume. Character of Wound/Ulcer Post Debridement is improved. Severity of Tissue Post Debridement is: Fat layer exposed. Post procedure Diagnosis Wound #5: Same as Pre-Procedure Plan Follow-up Appointments: Return appointment in 3 weeks. Dressing Change Frequency: Change Dressing every other day. - all wounds Skin Barriers/Peri-Wound Care: Moisturizing lotion - both legs Wound Cleansing: May shower with protection. Primary Wound Dressing: Wound #16 Right Calcaneus: Iodoflex Wound #19 Right,Lateral Malleolus: Iodoflex Wound #20 Left,Lateral Malleolus: Iodoflex Wound #23 Right,Posterior Lower Leg: Iodoflex Wound #24 Right,Anterior Lower Leg: Iodoflex Wound #25 Left,Lateral Foot: Iodoflex Wound #5 Right,Medial Lower Leg: Iodoflex Secondary Dressing: Dry Gauze - all wounds Edema Control: Kerlix and Coban - Bilateral Avoid standing for long periods of time Elevate legs to the level of the heart or above for 30 minutes daily and/or when sitting, a frequency of: - throughout the day Off-Loading: Turn and reposition every 2 hours Other: - float heels off of bed/chair with pillow under calves 1. I change the primary dressing to all her wounds to Iodoflex 2. Hopefully with revascularization on the right leg this will result in some improvement. 3. She goes back for repeat angiography focused on the left in 2 weeks  time Electronic Signature(s) Signed: 10/10/2020 5:03:49 PM By: Linton Ham MD Entered By: Linton Ham on 10/10/2020 13:44:57 -------------------------------------------------------------------------------- SuperBill Details Patient Name: Date of Service: [name] [name], [name] Manges. 10/10/2020 Medical Record Number: [id number] Patient Account Number: [id number] Date of Birth/Sex: Treating RN: 08-12-1951 (69 y.o. 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 I87.331 Chronic venous hypertension (idiopathic) with ulcer and  inflammation of right lower extremity L89.616 Pressure-induced deep tissue damage of right heel L97.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 Facility Procedures CPT4 Code: [id number] Description: 83151 - DEB SUBQ TISSUE 20 SQ CM/< ICD-10 Diagnosis Description L97.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 L97.811 Non-pressure chronic ulcer of other part of right lower leg limited to breakdow Modifier: n of skin Quantity: 1 Physician Procedures : CPT4 Code Description Modifier [id number] 11042 - WC PHYS SUBQ TISS 20 SQ CM ICD-10 Diagnosis Description X10.626 Non-pressure chronic ulcer of right calf with necrosis of muscle L97.811 Non-pressure chronic ulcer of other part of right lower leg limited  to breakdown of skin Quantity: 1 Electronic Signature(s) Signed: 10/10/2020 5:03:49 PM By: Linton Ham MD Entered By: Linton Ham on 10/10/2020 13:45:24

## 2020-10-11 DIAGNOSIS — Z1231 Encounter for screening mammogram for malignant neoplasm of breast: Secondary | ICD-10-CM | POA: Diagnosis not present

## 2020-10-11 LAB — HM MAMMOGRAPHY

## 2020-10-13 ENCOUNTER — Encounter: Payer: Self-pay | Admitting: Internal Medicine

## 2020-10-17 ENCOUNTER — Telehealth: Payer: Self-pay

## 2020-10-17 NOTE — Telephone Encounter (Signed)
Pt call to verify arrival time for procedure on 10/25/20. Advised pt to arrive at [facility] at 0930 AM to admitting. Pt voiced understanding.

## 2020-10-21 DIAGNOSIS — Z23 Encounter for immunization: Secondary | ICD-10-CM | POA: Diagnosis not present

## 2020-10-23 ENCOUNTER — Other Ambulatory Visit (HOSPITAL_COMMUNITY): Payer: Self-pay | Admitting: Internal Medicine

## 2020-10-24 ENCOUNTER — Other Ambulatory Visit (HOSPITAL_COMMUNITY)
Admission: RE | Admit: 2020-10-24 | Discharge: 2020-10-24 | Disposition: A | Discharge status: Home / Self Care | Payer: Medicare Other | Source: Ambulatory Visit | Attending: Surgery | Admitting: Surgery

## 2020-10-24 DIAGNOSIS — Z20822 Contact with and (suspected) exposure to covid-19: Secondary | ICD-10-CM | POA: Insufficient documentation

## 2020-10-24 DIAGNOSIS — Z01812 Encounter for preprocedural laboratory examination: Secondary | ICD-10-CM | POA: Diagnosis not present

## 2020-10-24 LAB — SARS CORONAVIRUS 2 (TAT 6-24 HRS): SARS Coronavirus 2: NEGATIVE

## 2020-10-25 ENCOUNTER — Encounter (HOSPITAL_COMMUNITY)
Admission: RE | Disposition: A | Discharge status: Home / Self Care | Payer: Self-pay | Source: Home / Self Care | Attending: Surgery

## 2020-10-25 ENCOUNTER — Other Ambulatory Visit: Payer: Self-pay

## 2020-10-25 ENCOUNTER — Ambulatory Visit (HOSPITAL_COMMUNITY)
Admission: RE | Admit: 2020-10-25 | Discharge: 2020-10-25 | Disposition: A | Discharge status: Home / Self Care | Payer: Medicare Other | Attending: Surgery | Admitting: Surgery

## 2020-10-25 DIAGNOSIS — Z79890 Hormone replacement therapy (postmenopausal): Secondary | ICD-10-CM | POA: Diagnosis not present

## 2020-10-25 DIAGNOSIS — L97819 Non-pressure chronic ulcer of other part of right lower leg with unspecified severity: Secondary | ICD-10-CM | POA: Insufficient documentation

## 2020-10-25 DIAGNOSIS — E039 Hypothyroidism, unspecified: Secondary | ICD-10-CM | POA: Insufficient documentation

## 2020-10-25 DIAGNOSIS — Z9981 Dependence on supplemental oxygen: Secondary | ICD-10-CM | POA: Diagnosis not present

## 2020-10-25 DIAGNOSIS — L97829 Non-pressure chronic ulcer of other part of left lower leg with unspecified severity: Secondary | ICD-10-CM | POA: Insufficient documentation

## 2020-10-25 DIAGNOSIS — I70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 lower right leg: Secondary | ICD-10-CM | POA: Diagnosis not present

## 2020-10-25 DIAGNOSIS — I70248 Atherosclerosis of native arteries of left leg with ulceration of other part of lower left leg: Secondary | ICD-10-CM | POA: Diagnosis not present

## 2020-10-25 DIAGNOSIS — Z880 Allergy status to penicillin: Secondary | ICD-10-CM | POA: Insufficient documentation

## 2020-10-25 DIAGNOSIS — I70242 Atherosclerosis of native arteries of left leg with ulceration of calf: Secondary | ICD-10-CM | POA: Diagnosis not present

## 2020-10-25 DIAGNOSIS — Z7902 Long term (current) use of antithrombotics/antiplatelets: Secondary | ICD-10-CM | POA: Insufficient documentation

## 2020-10-25 DIAGNOSIS — D649 Anemia, unspecified: Secondary | ICD-10-CM | POA: Diagnosis not present

## 2020-10-25 DIAGNOSIS — Z79899 Other long term (current) drug therapy: Secondary | ICD-10-CM | POA: Diagnosis not present

## 2020-10-25 DIAGNOSIS — Z882 Allergy status to sulfonamides status: Secondary | ICD-10-CM | POA: Diagnosis not present

## 2020-10-25 DIAGNOSIS — M349 Systemic sclerosis, unspecified: Secondary | ICD-10-CM | POA: Diagnosis not present

## 2020-10-25 DIAGNOSIS — I10 Essential (primary) hypertension: Secondary | ICD-10-CM | POA: Diagnosis not present

## 2020-10-25 HISTORY — PX: PERIPHERAL VASCULAR BALLOON ANGIOPLASTY: CATH118281

## 2020-10-25 HISTORY — PX: LOWER EXTREMITY ANGIOGRAPHY: CATH118251

## 2020-10-25 LAB — POCT ACTIVATED CLOTTING TIME: Activated Clotting Time: 180 seconds

## 2020-10-25 LAB — POCT I-STAT, CHEM 8
BUN: 36 mg/dL — ABNORMAL HIGH (ref 8–23)
Calcium, Ion: 1.18 mmol/L (ref 1.15–1.40)
Chloride: 92 mmol/L — ABNORMAL LOW (ref 98–111)
Creatinine, Ser: 1.5 mg/dL — ABNORMAL HIGH (ref 0.44–1.00)
Glucose, Bld: 94 mg/dL (ref 70–99)
HCT: 30 % — ABNORMAL LOW (ref 36.0–46.0)
Hemoglobin: 10.2 g/dL — ABNORMAL LOW (ref 12.0–15.0)
Potassium: 3.7 mmol/L (ref 3.5–5.1)
Sodium: 136 mmol/L (ref 135–145)
TCO2: 33 mmol/L — ABNORMAL HIGH (ref 22–32)

## 2020-10-25 SURGERY — LOWER EXTREMITY ANGIOGRAPHY
Anesthesia: LOCAL

## 2020-10-25 MED ORDER — CEPHALEXIN 500 MG PO CAPS: 500.0000 mg | ORAL_CAPSULE | Freq: Two times a day (BID) | ORAL | 0 refills | Status: AC

## 2020-10-25 MED ORDER — SODIUM CHLORIDE 0.9 % IV SOLN: 250.0000 mL | INTRAVENOUS | Status: DC | PRN

## 2020-10-25 MED ORDER — HEPARIN SODIUM (PORCINE) 1000 UNIT/ML IJ SOLN
INTRAMUSCULAR | Status: AC
  Filled 2020-10-25: qty 1

## 2020-10-25 MED ORDER — IODIXANOL 320 MG/ML IV SOLN
INTRAVENOUS | Status: DC | PRN
  Administered 2020-10-25: 90 mL

## 2020-10-25 MED ORDER — MORPHINE SULFATE (PF) 2 MG/ML IV SOLN: 2.0000 mg | INTRAVENOUS | Status: DC | PRN

## 2020-10-25 MED ORDER — LABETALOL HCL 5 MG/ML IV SOLN: 10.0000 mg | INTRAVENOUS | Status: DC | PRN

## 2020-10-25 MED ORDER — HEPARIN (PORCINE) IN NACL 1000-0.9 UT/500ML-% IV SOLN
INTRAVENOUS | Status: AC
  Filled 2020-10-25: qty 1000

## 2020-10-25 MED ORDER — LIDOCAINE HCL (PF) 1 % IJ SOLN
INTRAMUSCULAR | Status: DC | PRN
  Administered 2020-10-25: 15 mL via INTRADERMAL

## 2020-10-25 MED ORDER — SODIUM CHLORIDE 0.9% FLUSH: 3.0000 mL | INTRAVENOUS | Status: DC | PRN

## 2020-10-25 MED ORDER — ONDANSETRON HCL 4 MG/2ML IJ SOLN: 4.0000 mg | Freq: Four times a day (QID) | INTRAMUSCULAR | Status: DC | PRN

## 2020-10-25 MED ORDER — FENTANYL CITRATE (PF) 100 MCG/2ML IJ SOLN
INTRAMUSCULAR | Status: DC | PRN
  Administered 2020-10-25: 50 ug via INTRAVENOUS

## 2020-10-25 MED ORDER — OXYCODONE HCL 5 MG PO TABS: 5.0000 mg | ORAL_TABLET | ORAL | Status: DC | PRN

## 2020-10-25 MED ORDER — MIDAZOLAM HCL 2 MG/2ML IJ SOLN
INTRAMUSCULAR | Status: AC
  Filled 2020-10-25: qty 2

## 2020-10-25 MED ORDER — ACETAMINOPHEN 325 MG PO TABS: 650.0000 mg | ORAL_TABLET | ORAL | Status: DC | PRN

## 2020-10-25 MED ORDER — SODIUM CHLORIDE 0.9% FLUSH: 3.0000 mL | Freq: Two times a day (BID) | INTRAVENOUS | Status: DC

## 2020-10-25 MED ORDER — SODIUM CHLORIDE 0.9 % IV SOLN: INTRAVENOUS | Status: DC

## 2020-10-25 MED ORDER — SODIUM CHLORIDE 0.9 % WEIGHT BASED INFUSION: 1.0000 mL/kg/h | INTRAVENOUS | Status: DC

## 2020-10-25 MED ORDER — LIDOCAINE HCL (PF) 1 % IJ SOLN
INTRAMUSCULAR | Status: AC
  Filled 2020-10-25: qty 30

## 2020-10-25 MED ORDER — MIDAZOLAM HCL 2 MG/2ML IJ SOLN
INTRAMUSCULAR | Status: DC | PRN
  Administered 2020-10-25: 1 mg via INTRAVENOUS

## 2020-10-25 MED ORDER — FENTANYL CITRATE (PF) 100 MCG/2ML IJ SOLN
INTRAMUSCULAR | Status: AC
  Filled 2020-10-25: qty 2

## 2020-10-25 MED ORDER — HEPARIN (PORCINE) IN NACL 1000-0.9 UT/500ML-% IV SOLN
INTRAVENOUS | Status: DC | PRN
  Administered 2020-10-25 (×2): 500 mL

## 2020-10-25 MED ORDER — HEPARIN SODIUM (PORCINE) 1000 UNIT/ML IJ SOLN
INTRAMUSCULAR | Status: DC | PRN
  Administered 2020-10-25: 5500 [IU] via INTRAVENOUS

## 2020-10-25 MED ORDER — HYDRALAZINE HCL 20 MG/ML IJ SOLN: 5.0000 mg | INTRAMUSCULAR | Status: DC | PRN

## 2020-10-25 SURGICAL SUPPLY — 18 items
BALLN STERLING OTW 3X40X150 (BALLOONS) ×3
BALLOON STERLING OTW 3X40X150 (BALLOONS) ×2 IMPLANT
CATH OMNI FLUSH 5F 65CM (CATHETERS) ×3 IMPLANT
CATH SOFT-VU 4F 65 STRAIGHT (CATHETERS) ×2 IMPLANT
CATH SOFT-VU STRAIGHT 4F 65CM (CATHETERS) ×3
DEVICE CONTINUOUS FLUSH (MISCELLANEOUS) ×3 IMPLANT
KIT ENCORE 26 ADVANTAGE (KITS) ×3 IMPLANT
KIT MICROPUNCTURE NIT STIFF (SHEATH) ×3 IMPLANT
KIT PV (KITS) ×3 IMPLANT
SHEATH FLEX ANSEL ANG 5F 45CM (SHEATH) ×3 IMPLANT
SHEATH PINNACLE 5F 10CM (SHEATH) ×3 IMPLANT
SHEATH PROBE COVER 6X72 (BAG) ×3 IMPLANT
SYR MEDRAD MARK 7 150ML (SYRINGE) ×3 IMPLANT
TRANSDUCER W/STOPCOCK (MISCELLANEOUS) ×3 IMPLANT
TRAY PV CATH (CUSTOM PROCEDURE TRAY) ×3 IMPLANT
WIRE BENTSON .035X145CM (WIRE) ×3 IMPLANT
WIRE G V18X300CM (WIRE) ×6 IMPLANT
WIRE ROSEN-J .035X180CM (WIRE) ×3 IMPLANT

## 2020-10-25 NOTE — Progress Notes (Signed)
Site area: rt groin arterial site Site Prior to Removal:  Level 0 Pressure Applied For: 30 minutes Manual:   yes Patient Status During Pull:  stable Post Pull Site:  Level 0 Post Pull Instructions Given:  yes Post Pull Pulses Present: rt peroneal pulse dopplered Dressing Applied:  Gauze and tegaderm Bedrest begins @ 1540 Comments:

## 2020-10-25 NOTE — Progress Notes (Signed)
Patient and husband was given discharge instructions. Both verbalized understanding.

## 2020-10-25 NOTE — Discharge Instructions (Signed)
Femoral Site Care This sheet gives you information about how to care for yourself after your procedure. Your health care provider may also give you more specific instructions. If you have problems or questions, contact your health care provider. What can I expect after the procedure? After the procedure, it is common to have:  Bruising that usually fades within 1-2 weeks.  Tenderness at the site. Follow these instructions at home: Wound care  Follow instructions from your health care provider about how to take care of your insertion site. Make sure you: ? Wash your hands with soap and water before you change your bandage (dressing). If soap and water are not available, use hand sanitizer. ? Change your dressing as told by your health care provider. ? Leave stitches (sutures), skin glue, or adhesive strips in place. These skin closures may need to stay in place for 2 weeks or longer. If adhesive strip edges start to loosen and curl up, you may trim the loose edges. Do not remove adhesive strips completely unless your health care provider tells you to do that.  Do not take baths, swim, or use a hot tub until your health care provider approves.  You may shower 24-48 hours after the procedure or as told by your health care provider. ? Gently wash the site with plain soap and water. ? Pat the area dry with a clean towel. ? Do not rub the site. This may cause bleeding.  Do not apply powder or lotion to the site. Keep the site clean and dry.  Check your femoral site every day for signs of infection. Check for: ? Redness, swelling, or pain. ? Fluid or blood. ? Warmth. ? Pus or a bad smell. Activity  For the first 2-3 days after your procedure, or as long as directed: ? Avoid climbing stairs as much as possible. ? Do not squat.  Do not lift anything that is heavier than 10 lb (4.5 kg), or the limit that you are told, until your health care provider says that it is safe.  Rest as  directed. ? Avoid sitting for a long time without moving. Get up to take short walks every 1-2 hours.  Do not drive for 24 hours if you were given a medicine to help you relax (sedative). General instructions  Take over-the-counter and prescription medicines only as told by your health care provider.  Keep all follow-up visits as told by your health care provider. This is important. Contact a health care provider if you have:  A fever or chills.  You have redness, swelling, or pain around your insertion site. Get help right away if:  The catheter insertion area swells very fast.  You pass out.  You suddenly start to sweat or your skin gets clammy.  The catheter insertion area is bleeding, and the bleeding does not stop when you hold steady pressure on the area.  The area near or just beyond the catheter insertion site becomes pale, cool, tingly, or numb. These symptoms may represent a serious problem that is an emergency. Do not wait to see if the symptoms will go away. Get medical help right away. Call your local emergency services (911 in the [place] Do not drive yourself to the hospital. Summary  After the procedure, it is common to have bruising that usually fades within 1-2 weeks.  Check your femoral site every day for signs of infection.  Do not lift anything that is heavier than 10 lb (4.5 kg), or the   limit that you are told, until your health care provider says that it is safe. This information is not intended to replace advice given to you by your health care provider. Make sure you discuss any questions you have with your health care provider. Document Revised: 12/30/2017 Document Reviewed: 12/30/2017 Elsevier Patient Education  2020 [employer].

## 2020-10-25 NOTE — H&P (Signed)
[facility] [place]  Patient name: [name] [name] MRN: [id number]        DOB: 12/27/1951          Sex: female   REASON FOR VISIT:    Follow up  HISOTRY OF PRESENT ILLNESS:    [name] [name] is a 69 y.o. female who is a patient of Dr. [name].  He has performed laser ablation for treatment of venous stasis disease and also a right femoral endarterectomy with patch angioplasty on 09/14/2016  Previously, the patient has been treated in [place] with a left common femoral endarterectomy and left superficial femoral artery stent as well as a right superficial femoral artery stent.  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  She has nonhealing wounds on her right leg that are being treated at the wound center.  She also has scabs on her left leg from biopsy sites.  The patient suffers from scleroderma and has undergone several finger amputations.  She is a non-smoker.  She is medically managed for hypertension.   PAST MEDICAL HISTORY:       Past Medical History:  Diagnosis Date  . Anemia   . Arthritis   . DOE (dyspnea on exertion)   . Epistaxis   . History of chicken pox   . Hypertension   . Hypothyroidism   . Leg ulcer ([place])   . Melanoma ([place]) 1999  . 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 Non Hodgkin's lymphoma ([place])    Treated with chemo 2010, felt to be cured.  Marland Kitchen PAH (pulmonary artery hypertension) ([place])   . Peripheral arterial occlusive disease ([place])   . Pulmonary hypertension ([place])   . Requires supplemental oxygen    2 liters-3liters when moving  . 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 Lupus Father   . Lymphoma Father   . Hyperlipidemia Mother   . Cancer Daughter        sarcoma  . Colon cancer Other     SOCIAL HISTORY:   Social History        Tobacco Use  . Smoking status: Never  Smoker  . Smokeless tobacco: Never Used  Substance Use Topics  . Alcohol use: Yes    Alcohol/week: 0.0 standard drinks    Comment: 1-2 drinks per day wine.liquor or beer     ALLERGIES:        Allergies  Allergen Reactions  . Levofloxacin Other (See Comments)    FLU LIKE SYMPTOM Other reaction(s): Other (See Comments) FLU LIKE SYMPTOM  . Sulfa Antibiotics Rash and Other (See Comments)    Face peeled Severe rash with significant peeling of face. No airway involvement per patient Face peeled  . Levofloxacin In D5w Other (See Comments)    FLU LIKE SYMPTOMS FLU LIKE SYMPTOMS  . Doxycycline Hyclate Swelling    SWELLING REACTION UNSPECIFIED  "TABLETS ONLY - CAPSULES ARE TOLERATED FINE"  . Penicillins Rash    Has patient had a PCN reaction causing immediate rash, facial/tongue/throat swelling, SOB or lightheadedness with hypotension: no Has patient had a PCN reaction causing severe rash involving mucus membranes or skin necrosis: no Has patient had a PCN reaction that required hospitalization: no Has patient had a PCN reaction occurring within the last 10 years: {no If all of the above answers are "NO", then may proceed with  Cephalosporin use.     CURRENT MEDICATIONS:         Current Outpatient Medications  Medication Sig Dispense Refill  . acetaminophen (TYLENOL) 500 MG tablet Take 1 tablet (500 mg total) by mouth every 12 (twelve) hours. 30 tablet 0  . Biotin 5000 MCG TABS Take 5,000 mcg by mouth daily.    Marland Kitchen CALCIUM-MAGNESIUM-VITAMIN D PO Take 2 tablets by mouth daily.    . Cholecalciferol (VITAMIN D) 2000 units CAPS Take 2,000 Units by mouth daily.    . citalopram (CELEXA) 20 MG tablet TAKE 1 TABLET BY MOUTH EVERYDAY AT BEDTIME 90 tablet 3  . clopidogrel (PLAVIX) 75 MG tablet TAKE 1 TABLET BY MOUTH EVERY DAY 90 tablet 3  . FERREX 150 150 MG capsule TAKE 1 CAPSULE BY MOUTH TWICE A DAY 180 capsule 1  . fish oil-omega-3 fatty acids 1000 MG capsule  Take 1,000 mg by mouth 2 (two) times daily.     . folic acid (FOLVITE) 1 MG tablet TAKE 1 TABLET BY MOUTH EVERY DAY 90 tablet 1  . levothyroxine (SYNTHROID) 50 MCG tablet TAKE 1 TABLET BY MOUTH DAILY BEFORE BREAKFAST 90 tablet 1  . loperamide (IMODIUM) 2 MG capsule Take by mouth as needed for diarrhea or loose stools.    . magnesium oxide (MAG-OX) 400 (241.3 Mg) MG tablet Take 0.5 tablets (200 mg total) by mouth 2 (two) times daily. 60 tablet 0  . Multiple Vitamin (MULTIVITAMIN WITH MINERALS) TABS tablet Take 1 tablet by mouth daily.    . OPSUMIT 10 MG tablet Take 1 tablet (13m) by mouth daily. 30 tablet 11  . OXYGEN Inhale 2-3 L into the lungs continuous.     . pantoprazole (PROTONIX) 40 MG tablet Take 1 tablet (40 mg total) by mouth 2 (two) times daily. 60 tablet 1  . potassium chloride 20 MEQ/15ML (10%) SOLN TAKE 2 TEASPOONFUL (DILUTED IN WATER) ONCE DAILY 473 mL 11  . predniSONE (DELTASONE) 5 MG tablet Take 5 mg by mouth daily.    . Probiotic CAPS Take 1 capsule by mouth daily.    . RESTASIS 0.05 % ophthalmic emulsion Place 1 drop into both eyes 2 (two) times daily.    . rosuvastatin (CRESTOR) 5 MG tablet Take 1 tablet every Monday, Wednesday, and Friday. 45 tablet 3  . saccharomyces boulardii (FLORASTOR) 250 MG capsule Take 1 capsule (250 mg total) by mouth 2 (two) times daily. 60 capsule 0  . sildenafil (REVATIO) 20 MG tablet Take 3 tablets (60 mg total) by mouth 3 (three) times daily. Please call for office visit [phone] tablet 1  . spironolactone (ALDACTONE) 25 MG tablet TAKE 1 TABLET BY MOUTH EVERY DAY 90 tablet 0  . torsemide (DEMADEX) 20 MG tablet Take 40 mg by mouth 2 (two) times daily.    . valACYclovir (VALTREX) 500 MG tablet Take 1 tablet (500 mg total) by mouth 2 (two) times daily. 10 tablet 0  . vitamin B-12 (CYANOCOBALAMIN) 1000 MCG tablet Take 1,000 mcg by mouth daily.     No current facility-administered medications for this visit.    REVIEW  OF SYSTEMS:   _0  denotes positive finding, _1  denotes negative finding Cardiac  Comments:  Chest pain or chest pressure:    Shortness of breath upon exertion:    Short of breath when lying flat:    Irregular heart rhythm:        Vascular    Pain in calf, thigh, or hip brought on by ambulation:  Pain in feet at night that wakes you up from your sleep:     Blood clot in your veins:    Leg swelling:         Pulmonary    Oxygen at home:    Productive cough:     Wheezing:         Neurologic    Sudden weakness in arms or legs:     Sudden numbness in arms or legs:     Sudden onset of difficulty speaking or slurred speech:    Temporary loss of vision in one eye:     Problems with dizziness:         Gastrointestinal    Blood in stool:     Vomited blood:         Genitourinary    Burning when urinating:     Blood in urine:        Psychiatric    Major depression:         Hematologic    Bleeding problems:    Problems with blood clotting too easily:        Skin x   Rashes or ulcers:        Constitutional    Fever or chills:      PHYSICAL EXAM:      Vitals:   09/19/20 1427  BP: (!) 179/67  Pulse: 72  Resp: 20  Temp: 98.6 F (37 C)  SpO2: 91%  Weight: 122 lb (55.3 kg)  Height: 5' 7.5" (1.715 m)    GENERAL: The patient is a well-nourished female, in no acute distress. The vital signs are documented above. CARDIAC: There is a regular rate and rhythm.  VASCULAR: Nonpalpable pedal pulses PULMONARY: Non-labored respirations ABDOMEN: Soft and non-tender with normal pitched bowel sounds.  MUSCULOSKELETAL: There are no major deformities or cyanosis. NEUROLOGIC: No focal weakness or paresthesias are detected. SKIN: See photo below  PSYCHIATRIC: The patient has a normal affect.      STUDIES:   Reviewed the following ultrasound study: ABIs were unreliable  due to calcified vessels.  Waveforms bilaterally were monophasic Right toe pressure was 78  MEDICAL ISSUES:   Nonhealing bilateral lower extremity ulcers (right more severe than the left).  She has undergone bilateral femoral endarterectomy and bilateral superficial femoral artery stenting.  Ultrasound today shows monophasic bilateral lower extremity waveforms.  She has not had angiographic imaging for 4 years.  I discussed proceeding with an arteriogram.  This would be a left femoral access with focus and intervention on the right leg if indicated.  I will try to get this done as soon as possible.  The patient's earliest date is    Leia Alf, MD, FACS [facility] [phone] Pager [phone]

## 2020-10-25 NOTE — Op Note (Signed)
Patient name: [name] [name] MRN: [id number] DOB: 1951-03-12 Sex: female  10/25/2020 Pre-operative Diagnosis: Bilateral lower extremity ulcers Post-operative diagnosis:  Same Surgeon:  Annamarie Major Procedure Performed:  1.  Ultrasound-guided access, right femoral artery  2.  Angioplasty, left anterior tibial artery  3.  Angioplasty, left tibioperoneal trunk  4.  Conscious sedation, 46 minutes   Indications: The patient has previously undergone percutaneous intervention of the right leg.  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  She is back today for intervention.  Procedure:  The patient was identified in the holding area and taken to room 8.  The patient was then placed supine on the table and prepped and draped in the usual sterile fashion.  A time out was called.  Conscious sedation was administered with the use of IV fentanyl and Versed under continuous physician and nurse monitoring.  Heart rate, blood pressure, and oxygen saturation were continuously monitored.  Total sedation time was 47 minutes.  Ultrasound was used to evaluate the right common femoral artery.  It was patent .  A digital ultrasound image was acquired.  A micropuncture needle was used to access the right common femoral artery under ultrasound guidance.  An 018 wire was advanced without resistance and a micropuncture sheath was placed.  The 018 wire was removed and a benson wire was placed.  The micropuncture sheath was exchanged for a 5 french sheath.  The aortic bifurcation was crossed and a Omni Flush cath was advanced into the left external iliac artery and left leg runoff was performed Findings:    Left Lower Extremity: The left common femoral profundofemoral artery are widely patent.  The superficial femoral artery is patent throughout its course.  There is diffuse calcification with mild luminal narrowing.  The popliteal artery is patent throughout  its course.  The dominant runoff vessel is anterior tibial artery.  Previous images in a different angle suggested 60 to 70% proximal anterior tibial artery stenosis.  There was also a greater than 70% stenosis at the origin of the tibioperoneal trunk.  The dominant vessel across the ankle is anterior tibial artery  Intervention: At this point the decision was made to intervene.  A Rosen wire was inserted followed by placement of 5 French 45 cm Ansell 1 sheath.  The patient was fully heparinized.  I then used a V-18 wire with the support of a Sterling 3 x 40 balloon.  The anterior tibial artery was selected.  Balloon angioplasty was performed at the proximal portion of the anterior tibial artery at a atmospheres for 2 minutes.  Follow-up imaging revealed resolution of the stenosis however there did appear to be a more distal lesion at this time.  A second balloon inflation was performed and this resolved after 2 minutes.  Next attention was turned towards the tibioperoneal trunk lesion.  The peroneal artery was selected with the wire.  The 3 x 40 balloon was then inserted and balloon angioplasty was performed of the tibioperoneal trunk and the proximal peroneal artery.  Unfortunately the balloon ruptured following insufflation.  I removed the balloon and then performed angiography which showed resolution of the tibioperoneal trunk lesion despite suboptimal timing of angioplasty.  I elected to leave this alone.  Catheters and wires were removed.  The patient taken the holding of her sheath pull with her coagulation profile correct.  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  Both lesions were successfully treated with a 3 mm balloon.  Theotis Burrow, M.D., [facility] [place] Office: [phone] Pager:  [phone]

## 2020-10-26 ENCOUNTER — Encounter (HOSPITAL_COMMUNITY): Payer: Self-pay | Admitting: Surgery

## 2020-10-26 LAB — POCT ACTIVATED CLOTTING TIME: Activated Clotting Time: 191 seconds

## 2020-10-31 ENCOUNTER — Encounter (HOSPITAL_BASED_OUTPATIENT_CLINIC_OR_DEPARTMENT_OTHER): Payer: Medicare Other | Attending: Internal Medicine | Admitting: Internal Medicine

## 2020-10-31 ENCOUNTER — Other Ambulatory Visit: Payer: Self-pay

## 2020-10-31 DIAGNOSIS — L89616 Pressure-induced deep tissue damage of right heel: Secondary | ICD-10-CM | POA: Insufficient documentation

## 2020-10-31 DIAGNOSIS — I83222 Varicose veins of left lower extremity with both ulcer of calf and inflammation: Secondary | ICD-10-CM | POA: Diagnosis not present

## 2020-10-31 DIAGNOSIS — L97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: Secondary | ICD-10-CM | POA: Insufficient documentation

## 2020-10-31 DIAGNOSIS — I70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 lower right leg: Secondary | ICD-10-CM | POA: Diagnosis not present

## 2020-10-31 DIAGNOSIS — I70248 Atherosclerosis of native arteries of left leg with ulceration of other part of lower left leg: Secondary | ICD-10-CM | POA: Insufficient documentation

## 2020-10-31 DIAGNOSIS — I87331 Chronic venous hypertension (idiopathic) with ulcer and inflammation of right lower extremity: Secondary | ICD-10-CM | POA: Diagnosis not present

## 2020-10-31 DIAGNOSIS — L97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: Secondary | ICD-10-CM | POA: Insufficient documentation

## 2020-10-31 DIAGNOSIS — L97811 Non-pressure chronic ulcer of other part of right lower leg limited to breakdown of skin: Secondary | ICD-10-CM | POA: Insufficient documentation

## 2020-10-31 DIAGNOSIS — L89619 Pressure ulcer of right heel, unspecified stage: Secondary | ICD-10-CM | POA: Diagnosis not present

## 2020-10-31 DIAGNOSIS — L97318 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: Secondary | ICD-10-CM | POA: Diagnosis not present

## 2020-10-31 NOTE — Progress Notes (Signed)
[name] [name], [name] [name] ([id number]) Visit Report for 10/31/2020 HPI Details Patient Name: Date of Service: [name] 10/31/2020 12:30 PM Medical Record Number: [id number] Patient Account Number: [id number] Date of Birth/Sex: Treating RN: 28-Dec-1951 (69 y.o. [name] Primary Care Provider: [name] Other Clinician: Referring Provider: Treating Provider/Extender: [name] in Treatment: 449 History of Present Illness HPI Description: this is a patient who initially came to [place] for wounds on the medial malleoli bilaterally as well as her upper medial lower extremities bilaterally. These wounds eventually healed with assistance of Apligraf's bilaterally. While this was occurring she developed the current wound which opened into a fairly substantial wound on the right lower extremity. These are mostly secondary to venous stasis physiology however the patient also has underlying scleroderma, pulmonary hypertension. The wound has been making good progress lately with the Hydrofera Blue-based dressing. 03/17/2015; patient had a arterial evaluation a year ago. Her right ABI was 0.86 left was 1.0. T brachial index was 0.41 on the right 0.45 on the left. Her oe bilateral common femoral artery waveforms were triphasic. Her white popliteal posterior tibial artery and anterior tibial artery waveforms were monophasic with good amplitude. Luteal artery waveforms were biphasic it was felt that her bilateral great toe pressures are of normal although adequate for tissue healing. 03/24/2015. The condition of this wound is not really improved that. He was covered with as fibrinous surface slough and eschar. This underwent an aggressive debridement with both a curette and scalpel. I still cannot really get down to what I can would consider to be a viable surface. There is no evidence of infection. Previous workup for ischemia roughly a year ago was negative nevertheless I think  that continues to be a concern 04/07/15. The patient arrived for application of second Apligraf. Once again the surface of this wound is certainly less viable than I would like for an advanced treatment option. An aggressive debridement was done. She developed some arteriolar bleeding which required that along pressure and silver alginate. 04/21/15: change in the condition of this wound. Once again it is covered in a gelatinous surface slough. After debridement today surface of the wound looks somewhat better but now a heeling surface 05/05/15 Apligraf #4 applied.Still a lot of slough on this wound. 05/19/15 Apligraf #5 applied. Still a lot of slough on this wound I did not aggressively remove this 06/02/15; continued copious amounts of surface slough. This debridement fairly easily. 06/08/2015 -- the last time she had a venous duplex study done was over 3 years ago and the surgery was prior to that. I have recommended that she sees Dr. early for a another opinion regarding a repeat venous duplex and possibly more endovenous ablation of vein stripping of micro-phlebotomies. 06/16/15; wound has a gelatinous surface eschar that the debridement fairly easily to a point. I don't disagree with the venous workup and perhaps even arterial re-evaluation. She is on prednisone 5 mg and continue his medications for her pulmonary artery hypertension I am not sure if the latter have any wound care healing issues I would need to investigate this. 06/23/15 continues with a gelatinous surface eschar with of fibrinous underlying. What I can see of this wound does not look unhealthy however I just can't get this material which I think is 2 different layers off. Empiric culture done 06/30/15; unfortunately not a lot of change in this wound. A gelatinous surface eschar is easily removed however it has a tight fibrinous  surface underneath the. 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 The wound comes back and  basically and unchanged state the. She has a gelatinous surface that is more easily removed however there is a tightly fibrinous surface underneath the. There is no evidence of infection. She has a vascular follow-up next month. I would have to inject her in order to do a more aggressive debridement of this area 07/21/15 the wound is roughly in the same state albeit the debridement is done with greater ease. There is less of the fibrinous eschar underneath the. There is no evidence of infection. She has follow-up with vascular surgery next week. No evidence of surrounding infection. Her original distal wounds healed while this one formed. 07/28/15; wound is easier to debride. No wound erythema. She is seeing Dr. [name] next week. 08/18/15 Has been to [facility] for repeat ablation. Have been using medihoney pad with some improvement 08/25/15; absolutely no change in the condition of this wound in either its overall size or surface condition in many months now. At one point I had this down to [place] healthier surface I think with [facility] however this did not actually progress towards closure. Do not believe that the wound has actually deteriorated in terms of volume at all. We have been using a medihoney pad which allows easier debridement of the gelatinous eschar but again no overall actual improvement. the patient is going towards an ablation with pain and vascular which I think is scheduled for next week. The only other investigation that I could first see would be a biopsy. She does have underlying scleroderma 09/02/15 eschar is much easier to debridement however the base of this does not look particularly vibrant. We changed to Iodoflex. The patient had her ablation earlier this week 09/09/15 again the debridement over the base of this wound is easier and the base of the wound looks considerably better. We will continue the Iodoflex. Dr. Tawni Millers has expressed his satisfaction with the result of her  ablation 09/15/15 once again the wound is relatively free of surface eschar. No debridement was done today. It has a pale-looking base to it. although this is not as deep as it once was it seems to be expanding especially inferiorly. She has had recent venous ablation but this is no closer to healing.I've gone ahead and done a punch biopsy this from the inferior part of the wound close to normal skin 09/22/15: the wound is relatively free of surface eschar. There is some surrounding eschar. I'm not exactly sure at what level the surface is that I am seeing. 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 No evidence of atypical infection, malignancy. 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 No debridement was done 09/29/15; the wound surface is relatively free of surface eschar. There is eschar surrounding the walls of the wound. Aside from the improvement in the amount of surface slough. This wound has not progressed any towards closure. There is not even a surface that looks like there at this is ready. There is no evidence of any infection nor maligancy based on biopsy I did on 9/15. I continued with the Iodoflex however I am looking towards some alternative to try to promote some closure or filling in of this surface. Consider triple layer Oasis. Collagen did not result in adequate control of the surface slough 10/13/15; the patient was in hospital last week with severe anemia. The wound looks somewhat better  after debridement although there is widening medially. There is no evidence of infection. 10/20/15; patient's wound on the right lateral lower leg is essentially unchanged. This underwent a light surface debridement and in general the debridement is easier and the surface looks improved. I noted in doing this on the side of the wound what appears to be a piece of calcium deposition. The patient noted that she had previous things on the tips of  her fingers. 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 She follows with rheumatology and I have asked them to talk to her about this. In view of that S the nonhealing ulcer may have something to do with calcinosis and also unrelieved Raynaud's disease in this area. I should note that her original wounds on the right and left medial malleolus and the inner aspects of both legs just below the knees did however heal over 10/30/15; the patient's wound on the right lateral lower leg is essentially unchanged. I was able to remove some calcified material from the medial wound edge. 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 Otherwise the wound appears essentially unchanged there is less adherent surface eschar. Some of the calcified material was sent to pathology for analysis 11/17/15. The patient's wound on the right lateral leg is essentially unchanged. Wider Medially. 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 There is some surface slough but I did not debridement this. No evidence of surrounding infection. 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 This will make healing difficult 12/15/15; the wound has less depth surface has a fibrinous slough and calcifications in the wound edge no evidence of infection 12/22/15; the wound definitely has less Fibrinous slough on the base. Calcifications around the wound edges are still evident. Although the wound bed looks healthier it is still pale in appearance. Previous biopsy did not show malignancy 01/04/15; surgical debridement of nonviable slough and  subcutaneous tissue the wound cleans up quite nicely but appears to be expanding outward calcifications around the wound edges are still there. 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 She is to see her rheumatologist I'll see if he has any opinions on this. My punch biopsy done in September did not show calcifications although these are clearly evident. 01/19/16 light selective debridement of nonviable surface slough. There is epithelialization medially. This gives me reason for cautious optimism. She has been to see her rheumatologist, there is nothing that can be done for this type of soft tissue calcification associated with scleroderma 02/02/16 no debridement although there is a light surface slough. She has 2 peninsulas of skin 1 inferiorly and one medially. We continue to make a slight and slow but definite progressive here 02/16/16; light surface debridement with more attention to the circumference of the wound bed where the fibrinous eschar is more prevalent. No calcifications detected. She seems to have done nicely with the [facility] with some epithelialization and some improvement in the overall wound volume. 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 Continued attention to the circumference  of the wound where the fibrinous eschar in calcinosis or prevalent. No calcifications were detected. She has continued improvement with Hydrofera Blue. The wound is no longer as deep 03/15/16 surgical debridement done to remove surface escha Especially around the circumference of the wound where there is nonviable subcutaneous tissue. In spite of this there is considerable improvement in the overall dimensions and depth of the wound. Islands of epithelialization are seen especially medially inferiorly and superiorly to a lesser extent.  She is using Hydrofera Blue at home 03/29/16; surgical debridement done to remove surface eschar and nonviable subcutaneous tissue. This cleans up quite nicely mention slightly larger in terms of length and width however depth is less 04/12/16; continued gradual improvement in terms of depth and the condition of the wound base. Debridement is done. Continuing long standing Hydrofera Blue at home with Kerlix Coban wraps 04/26/16; continued gradual improvement in terms of depth and management as well as condition of the wound base. Surgical debridement done she continues with Hydrofera Blue. 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 She initially came to this clinic venous insufficiency ulcers which have long since healed 05/17/16 continued improvement in terms of the depth and measurements of this wound although she has a tightly adherent fibrinous slough each time. We've been continued with long standing Hydrofera Blue which seems to done as well for this wound is many advanced treatment options. 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 She also has chronic venous insufficiency. She has an irritation on her lateral right ankle secondary to our wraps 05/10/16; wound appears to be smaller especially on the medial aspect and especially in the width. Wound was debridement surface looks better. She is also been in the hospital apparently with anemia again she tells me she had an endoscopy. Since she got home after 3 days which I believe was sometime last week she has had an irritated painful area on the right lateral ankle surrounding the lateral malleolus 05/31/16; much more adherent surface slough today then recently although I don't think the dimensions of changed that much. A more aggressive debridement is required. The irritated area over her medial malleolus is more pruritic and painful and I don't think represents  cellulitis 06/14/16; no major change in her wound dimensions however there is more tightly adherent surface slough which is disappointing. As well as she appears to have a new small area medially. Furthermore an irritated uncomfortable area on the lateral aspect of her right foot just below the lateral malleolus. 06/21/16; I'm seeing the patient back in follow-up for the new areas under her major wound on the right anterior leg. She has been using Hydrofera Blue to this area probably for several months now and although the dressings seem to be helping for quite a period of time I think things have stagnated lately. She comes in today with a relatively tight adherent surface slough and really no changes in the wound shape or dimension. The 2 small areas she had inferiorly are tiny but still open they seem improved this well. There is no uncontrolled edema and I don't think there is any evidence of cellulitis. 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 Patient has had vascular evaluations both venous and arterial. I have biopsied this area. There is no obvious infection. The worrisome thing today is that she  seems to be developing areas of erythema and epithelial damage on the medial aspect of the right foot. Also to a lesser degree inferior to the actual wound itself. Again I see no obvious changes to suggest cellulitis however as this is the only treatable option I will probably give her antibiotics. 07/13/16 no major change in the lady's large anterior right leg wound. Still covered with a very tightly adherent surface slough which is difficult to debridement. There is less erythema around this, culture last week grew pseudomonas I gave her ciprofloxacin. The area on her lateral right malleolus looks better- 08/02/16 the patient's wounds continued to decline. Her original large anterior right leg  wound looks deeper. Still adherent surface slough that is difficult to debridement. She has a small area just below this and a punched-out wound just below her lateral malleolus. In the meantime she is been in hospital with apparently an upper GI bleed on Plavix and aspirin. She is now just on Plavix she received 3 units of packed cells 08/23/16; since I last saw this 3 weeks ago, the open large area on her right leg looks about the same syrup. She has a small satellite lesion just underneath this. The area on her medial right ankle is now a deep necrotic wound. I attempted to debridement this however there is just too much pain. 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 She follows with vascular surgery and is scheduled for an angiogram in early September 09/06/16; the patient is going for an arteriogram tomorrow. Her original large wound on the right calf is about the same the satellite lesion underneath it is about the same however the area on her medial ankle is now deeper with exposed tendon. I am no longer attempting to debride these wounds 09/20/16; the patient has undergone a right femoral endarterectomy and Dacron patch angioplasty. This seems to have helped the flow in her right leg. 10/04/16; Arrived today for aggressive debridement of the wounds on her right calf the original wound the one beneath it and a difficult area over her right lateral leg just above the lateral malleolus. 10/25/16; her 3 open wounds are about the same in terms of dimensions however the surface appears a lot healthier post debridement. Using Iodoflex 10/18/16 we have been using Iodoflex to her wounds which she tolerated with some difficulty. 10/11/16; has been using Santyl for a period of time with some improvement although again very adherent surface slough would prevent any attempted healing this. She has a original wound on the left calf, the  satellite underneath that and the most recent wound on the right medial ankle. She has completed revascularization by Dr. early and has had venous ablation earlier. Want to go back to Iodoflex to see if week and get a healthier surface to this wound bed failing this I think she'll need to be taken to the OR and I am prepared to call Dr. Marla Roe to discuss this. She is obviously not a good candidate for general anesthesia however.; 11/08/16; I put her on Iodoflex last time to see if I can get the wound bed any healthier and unfortunately today still had tremendous surface slough. 11/15/16; 4 weeks' worth of Iodoflex with not much improvement. Debridement on the major wounds on her left anterior leg is easier however this does not maintain from week to week. The punched-out area on her medial right ankle 11/29/16; I attempted to  change the patient last visit to [facility] however she states this burned and was very uncomfortable therefore we gave her permission to go back to the eye out of complex which she already had at home. Also she noted a lot of pain and swelling on the lateral aspect of her leg before she traveled to [facility] for the holidays, I called her in doxycycline over the phone. This seems to have helped 12/06/16; Wounds unchanged by in large. Using Iodoflex 12/13/16; her wounds today actually looks somewhat better. The area on the right lateral lower leg has reasonably healthy-looking granulation and perhaps as actually filled and a bit. Debridement of the 2 wounds on the medial calf is easier and post debridement appears to have a healthier base. We have referred her to Dr. Migdalia Dk for consideration of operative debridement 12/20/16; we have a quick note from Dr. Merri Ray who feels that the patient needs to be referred to an academic center/plastic surgeon. This is due to the complexity of the patient's medical issues as it applies to anything in the OR. We  have been using Iodoflex 12/27/16; in general the wounds on the right leg are better in terms of the difficult to remove surface slough. She has been using Iodoflex. She is approved for Apligraf which I anticipated ordering in the next week or 2 when we get a better-looking surface 01/04/16 the deep wounds on the right leg generally look better. Both of them are debrided further surface slough. The area on the lateral right leg was not debrided. She is approved for Apligraf I think I'll probably order this either next week or the week after depending on the surface of the wounds superiorly. We have been using Iodoflex which will continue until then 01/11/16; the deep wounds on the right leg again have a surface slough that requires debridement. I've not been able to get the wound bed on either one of these wounds down to what would be acceptable for an advanced skin stab-like Apligraf. The area on the medial leg has been improving. We have been using hot Iodoflex to all wounds which seems to do the best at at least limiting the nonviable surface 01/24/17; we have continued Iodoflex and all her wound areas. Her debridement Gen. he is easier and the surface underneath this looks viable. Nevertheless these are large area wounds with exposed muscle at least on the anterior parts. We have ordered Apligraf's for 2 weeks from now. The patient will be away next week 02/07/17; the patient was close to have first Apligraf today however we did not order one. I therefore replaced her Iodoflex. She essentially has 3 large punched- out areas on her right anterior leg and right medial ankle. 02/11/17; Apligraf #1 02/25/17; Apligraf #2. In general some improvement in the right medial ankle and right anterior leg wounds. The larger wound medially perhaps some better 03/11/17; Apligraf #3. In general continued improvement in the right medial ankle and the right anterior leg wounds. 03/25/17 Apligraf #4. In general continued  improvement especially on the right medial ankle and the lower 04/08/17; Apligraf #5 in general continued improvement in all wound sites. 04/22/17; post Apligraf #5 her wound beds continued to look a lot better all of them up to the surface of the surrounding skin. Had a caramel-colored slough that I did not debrided in case there is residual Apligraf effect. The wounds are as good as I have seen these looking quite some time. 04/29/17; we applied [employer] and last week after completing her Apligraf. Wounds look as though they've contracted somewhat although they have a nonviable surface which was problematic in the past. Apparently she has been approved for further Apligraf's. We applied Iodosorb today after debridement. 05/06/17; we're fortunate enough today to be able to apply additional Apligraf approved by her insurance. In general all of her wounds look better Apligraf #6 05/24/17; Apligraf #7 continued improvement in all wounds 3 06/10/17 Apligraf #8. Continued improvement in the surface of all wounds. Not much of an improvement in dimensions as I might a follow 06/24/17 Apligraf #9 continued general improvement although not as much change in the wound areas I might of like. She has a new open area on the right anterior lateral ankle very small and superficial. She also has a necrotic wound on the tip of her right index finger probably secondary to severe uncontrolled Raynaud's phenomenon. She is already on sildenafil and already seen her rheumatologist who gave her Keflex. 07/08/17; Apligraf #10. Generalized improvement although she has a small additional wound just medial to the major wound area. 07/22/17; after discussion we decided not to reorder any further Apligraf's although there is been considerable improvement with these it hasn't been recently. The major wound anteriorly looks better. Smaller wounds beneath this and the more recent one and laterally look about the same. The area on the  right lateral lower leg looks about the same 07/29/17 this is a patient who is exceedingly complex. She has advanced scleroderma, crest syndrome including calcinosis, PAD status post revascularization, chronic venous insufficiency status post ablations. She initially presented to this clinic with wounds on her bilateral lower legs however these closed. More recently we have been dealing with a large open area superiorly on the right anterior leg, a smaller wound underneath this and then another one on the right medial lower extremity. These improve significantly with 10 Apligraf applications. Over the last 2-3 weeks we are making good progress with Hydrofera Blue and these seem to be making progress towards closure 08/19/17; wounds continued to make good improvement with Hydrofera Blue and episodic aggressive debridements 08/26/17; still using Hydrofera Blue. Good improvements 09/09/17; using Hydrofera Blue continued improvement. Area on the lateral part of her right leg has only a small remaining open area. The small inferior satellite region is for all intents and purposes closed. Her major wound also is come in in terms of depth and has advancing epithelialization. 09/16/17; using Hydrofera Blue with continued improvement. The smaller satellite wound. We've closed out today along with a new were satellite wound medially. The area on her medial ankle is still open and her major wound is still open but making improvement. All using Hydrofera Blue. Currently 09/30/17; using Hydrofera Blue. Still a small open area on the lateral right ankle area and her original major wound seems to be making gradual and steady improvement. 10/14/17; still using Hydrofera Blue. Still too small open areas on the right lateral ankle. Her original major wound is horizontal and linear. The most problematic area paradoxically seems to be the area to the medial area wears I thought it would be the lateral. 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 10/28/17; still using Hydrofera Blue. Right lateral ankle has a very small open area superiorly on the most lateral part of the wound. Her original open wound has 2 open areas now separated by normal skin and we've redefined this. 11/11/17; still using Hydrofera Blue  area and right lateral ankle continues to have a small open area on mostly the lateral part of the wound. Her original wound has 2 small open areas now separated by a considerable amount of normal skin 11/28/17; the patient called in slightly before Thanksgiving to report pain and erythema above the wound on the right leg. In the past this is responded well to treatment for cellulitis and I gave her over the phone doxycycline. She stated this resulted in fairly abrupt improvement. We have been using Hydrofera Blue for a prolonged period of time to the larger wound anteriorly into the remaining wound on her right right lateral ankle. The latter is just about closed with only a small linear area and the bottom of the [place]. 12/02/17; use endoform and left the dressing on since last visit. There is no tenderness and no evidence of infection. 12/16/17; patient has been using Endoform but not making much progress. The 2 punched out open areas anteriorly which were the reminiscence of her major wound appear deeper. The area on the lateral aspect of her right calf also appears deeper. Also she has a puzzling tender swelling above her wound on the right leg. This seems larger than last time. Just above her wounds there appears to be some fluctuance in this area it is not erythematous and there is no crepitus 12/30/17; patient has been using Endoform up until last week we used Hydrofera Blue. Ultrasound of the swelling above her 2 major wounds last time was negative for a fluid collection. I gave her cefaclor for the erythema and tenderness in this area which seems better.  Unfortunately both punched out areas anteriorly and the area on her right medial lower leg appear deeper. In fact the lateral of the wounds anteriorly actually looks as though it has exposed tendon and/or muscle sheath. She is not systemically unwell. She is complaining of vaginitis type symptoms presumably Candida from her antibiotics. 01/06/18; we're using santyl. she has 2 punched out areas anteriorly which were initially part of a large wound. Unfortunately medially this is now open to tendon/muscle. All the wounds have the same adherent very difficult to debride surface. 01/20/18; 2 week follow-up using Santyl. She has the 2 punched out areas anteriorly which were initially part of her large surface wound there. Medially this still has exposed muscle. All of these have the same tightly adherent necrotic surface which requires debridement. PuraPly was not accepted by the patient's insurance however her insurance I think it changed therefore we are going to run Apligraf to gain 02/03/18; the patient has been using Santyl. The wound on the right lateral ankle looks improved and the 2 areas anteriorly on the right leg looks about the same. The medial one has exposed muscle. The lateral 1 requiresdebridement. We use PuraPly today for the first week 02/10/18; PuraPly #2. The patient has 3 wounds. The area on the right lateral ankle, 2 areas anteriorly that were part of her original large wound in this area the medial one has exposed muscle. All of the wounds were lightly debrided with a number 3 curet. PuraPly #2 applied the lateral wound on the calf and the right lateral ankle look better. 02/17/18; PuraPly #3. Patient has 3 wounds. The area on the right lateral ankle in 2 areas internally that were part of her original large wound. The lateral area has exposed muscle. She arrived with some complaints of pain around the right ankle. 02/24/18; PuraPly #4; not much change in any of  the 3 wound areas. Right  lateral ankle, right lateral calf. Both of these required debridement with a #3 curet. She tolerates this marginally. The area on the medial leg still has exposed muscle. Not much change in dimensions 03/03/18 PuraPly #5. The area on the medial ankle actually looks better however the 2 separate areas that were original parts of the larger right anterior leg wound look as though they're attempting to coalesce. 03/10/18; PuraPly #6. The area on the medial ankle actually continues to look and measures smaller however the 2 separate areas that were part of the original large wound on the right anterior leg have now coalesced. There hasn't been much improvement here. The lateral area actually has underlying exposed muscle 03/17/18-she is here in follow-up evaluation for ulcerations to the right lower extremity. She is voicing no complaints or concerns. She tolerated debridement. Puraply#7 placed 03/24/18; difficult right lower extremity ulcerations. PuraPly #8 place. She is been approved for [employer]. She did very well with Apligraf today however she is apparently reached her "lifetime max" 03/31/18; marginal improvement with PuraPly although her wounds looked as good as they have in several weeks today. Used TheraSkins #1 04/14/18 TheraSkin #2 today 04/28/18 TheraSkins #3. Wound slightly improved 05/12/18; TheraSkin skin #4. Wound response has been variable. 05/27/18 TheraSkin #5. Generally improvement in all wound areas. I've also put her in 3 layer compression to help with the severe venous hypertension 06/09/18; patient is done quite well with the TheraSkins unfortunately we have no further applications. I also put her in 3 layer compression last week and that really seems to of helped. 06/16/18; we have been using silver collagen. Wounds are smaller. Still be open area to the muscle layer of her calf however even that is contracted somewhat. She tells me that at night sometimes she has pain on the right  lateral calf at the site of her lower wound. Notable that I put her into 3 layer compression about 3 weeks ago. She states that she dangles her leg over the bed that makes it feel better but she does not describe claudication during the day She is going to call her secondary insurance to see if they will continue to cover advanced treatment products I have reviewed her arterial studies from 01/22/17; this showed an ABI in the right of 1 and on the left noncompressible. TBI on the right at 0.30 on the left at 0.34. It is therefore possible she has significant PAD with medial calcification falsely elevating her ABI into the normal range. I'll need to be careful about asking her about this next week it's possible the 3 layer compression is too much 06/23/18; was able to reapply TheraSkin 1 today. Edema control is good and she is not complaining of pain no claudication 07/07/18;no major change. New wound which was apparently a taper removal injury today in our clinic between her 2 wounds on the right calf TheraSkins #2 07/14/18; I think there is some improvement in the right lateral ankle and the medial part of her wound. There is still exposed muscle medially. 07/28/18; two-week follow-up. TheraSkins#3. Unfortunately no major change. 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 She seems to be doing well and in the base of the wound appears to show some progress at this point. This is the last approved [employer]askin  of the second set. 08/25/18; she has completed TheraSkin. There has been some improvement on the right lateral calf wound as well as the anterior leg wounds. The open area to muscle medially on the anterior leg wound is smaller. I'm going to transition her back to [facility] under Kerlix Coban change every  second day. She reports that she had some calcification removed from her right upper arm. We have had previous problems with calcifications in her wounds on her legs but that has not happened recently 09/08/18;using Hydrofera Blue on both her wound areas. Wounds seem to of contracted nicely. She uses Kerlix Coban wrap and changes at home herself 09/22/2018; using Hydrofera Blue on both her wound areas. Dimensions seem to have come down somewhat. There is certainly less depth in the medial part of the mid tibia wound and I do not think there is any exposed muscle at this point. 10/06/2018; 2-week follow-up. Using [facility] on her wound areas. Dimensions have come down nicely both on the right lateral ankle area in the right mid tibial area. She has no new complaints 10/20/2018; 2-week follow-up. She is using Hydrofera Blue. Not much change from the last time she was here. The area on the lateral ankle has less depth although it has raised edges on one side. I attempted to remove as much of the raised edge as I could without creating more additional wounds. The area on the right anterior mid tibia area looks the same. 11/03/2018; 2-week follow-up she is using Hydrofera Blue. On the right anterior leg she now has 2 wounds separated by a large area of normal skin. The area on the medial part still has I think exposed muscle although this area itself is a lot smaller. The area laterally has some depth. Both areas with necrotic debris. The area on her right lateral ankle has come in nicely 11/17/2018; patient continues to use Hydrofera Blue. We have been increasing separation of the 2 wounds anteriorly which were at one point joint the area on the right lateral calf continues to have I think some improvement in depth. 12/08/2018; patient continues to use Hydrofera Blue. There is some improvement in the area on the right lateral calf. The 2 areas that were initially part of the original large wound in the  mid right tibia are probably about the same. In fact the medial area is probably somewhat larger. We will run puraply through the patient's insurance 12/22/2018; she has been using Hydrofera Blue. We have small wounds on the right lateral calf and 2 small areas that were initially part of a large wound in the right mid tibia. We applied pure apply #1 today. 12/29/2018; we applied puraply #2. Her wounds look somewhat better especially on the right lateral calf and the lateral part of her original wound in the mid tibial area. 01/05/2019; perhaps slightly improved in terms of wound bed condition but certainly not as much improvement as I might of liked. Puraply #3 1/13: we did not have a correct sized puraply to apply. wounds more pinched out looking, I increased her compression to 3 layer last week to help with significant multilevel venous hypertension. Since then I've reviewed her arterial status. She has a right femoral endarterectomy and a distal left SFA stent. She was being followed by Dr. [name] for a period however she does not appear to have seen him in 3 years. I will set up an appointment. 1/20. The patient has an additional wound on the right  lateral calf between the distal wound and proximal wounds. We did not have Puraply last week. Still does not have a follow-up with Dr. early 1/27: Follow-up with Dr. early has been arranged apparently with follow-up noninvasive studies. Wounds are measuring roughly the same although they certainly look smaller 2/3; the patient had non invasive studies. Her ABIs on the right were 0.83 and on the left 1.02 however there was no great toe pressure bilaterally. Also worrisome monophasic waveforms at the PTA and dorsalis pedis. We are still using Puraply. We have had some improvement in all of the wounds especially the lateral part of the mid tibial area. 2/10; sees Dr. early of vein and vascular re-arterial studies next week. Puraply reapplied today. 2/17;  Dr. early of vein and vascular his appointment is tomorrow. Puraply reapplied after debridement of all wounds 2/24; the patient saw Dr. early I reviewed his note. sHe noted the previous right femoral endarterectomy with a Dacron patch. He also noted the normal ABI and the monophasic waveforms suggesting tibial disease. Overall he did not feel that she had any evidence of arterial insufficiency that would impair her wound healing. He did note her venous disease as well. He suggested PRN follow-up. 3/2; I had the last puraply applied today. The original wounds over the mid tibia area are improved where is the area on the right lower leg is not 3/9; wounds are smaller especially in the right mid tibia perhaps slightly in the right lateral calf. We finished with puraply and went to endoform today 3/23; the patient arrives after 2 weeks. She has been using endoform. I think all of her wounds look slightly better which includes the area on the right lateral calf just above the right lateral malleolus and the 2 in the right mid tibia which were initially part of the same wound. 4/27; TELEHEALTH visit; the patient was seen for telehealth visit today with her consent in the middle of the worldwide epidemic. Since she was last here she called in for antibiotics with pain and tenderness around the area on the right medial ankle. I gave her empiric doxycycline. She states this feels better. She is using endoform on both of these areas 5/11 TELEHEALTH; the patient was seen for telehealth visit today. She was accompanied at home by her husband. She has severe pulmonary hypertension accompanied scleroderma and in the face of the Covid epidemic cannot be safely brought into our clinic. We have been using endoform on her wound areas. There are essentially 3 wound areas now in the left mid tibia now 2 open areas that it one point were connected and one on the right lateral ankle just above the malleolus. 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 She cannot come to clinic. The wounds were reviewed today via telehealth. She has severe chronic venous hypertension which I think is centrally mediated. She has wounds on her right anterior tibia and right lateral ankle area. These are chronic. She has been using endoform. 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 We have been following her from telehealth. She has severe chronic venous hypertension which may be mostly centrally mediated secondary to right heart heart failure. She has wounds on her  right anterior tibia and right lateral ankle these are chronic we have been using endoform 6/22; TELEHEALTH; this patient was seen today via telehealth. 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 Since the last time we had contact with this patient she developed some pain and erythema around the wound on her right lateral malleolus/ankle and we put in antibiotics for her. This is resulted in good improvement with resolution of the erythema and tenderness. I changed her to silver alginate last time. We had been using endoform for an extended period of time 7/13; TELEHEALTH; this patient was seen today via telehealth. 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 She would continue to be at a prohibitive risk to be brought into our clinic unless this was absolutely necessary. These visits have been done with her approval as well as her husband. We have been using silver alginate to the areas on the right mid tibia and right lateral lower leg. 7/27  TELEHEALTH; patient was seen along with her husband today via telehealth. 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 We changed her to sample last visit. She has 2 areas a chronic wound on her right mid tibia and one just above her ankle. These were not the original wounds when she came into this clinic but she developed them during treatment 8/17; she comes in for her first face-to-face visit in a long period. She has a remaining area just medial to the right tibia which is the last open part of her large wound across this area. She also has an area on the right lateral lower leg. We prescribed Santyl last telehealth visit but they were concerned that this was making a deeper so they put silver alginate on it last week. Her husband changes the dressings. 8/31; using Santyl to the 2 wound areas some improvement in wound surfaces. Husband has surgery in 2 weeks we will put her out 3 weeks. Any of the advanced treatment options that I can think of that would be eligible for this wound would also cause her to have to come in weekly. The risk that the patient is just too high 9/21. Using Santyl to the 2 wound areas. Both of these are somewhat better although the medial mid tibia area still has exposed muscle. Lateral ankle requiring debridement. Using Santyl 10/12; using Santyl to the 2 wound areas. One on the right lateral ankle and the other in the medial calf which still has exposed muscle. Both areas have come down in size and have better looking surfaces. She has made nice progress with santyl 11/2; we have been using Santyl to the 2 wound areas. Right lateral ankle and the other in the mid tibia area the medial part of this still has exposed muscle. 11/23/2019 on evaluation today patient appears to be doing about the same really with regard to her wounds. She is actually not very pleased with how things seem to  be progressing at this point she tells me that she really has not noted much improvement unfortunately. With that being said there is no signs of active infection at this time. There is some slough buildup noted at this time which again along with some dry skin around the edges of the wound I think would benefit her to try to debride some of  this away. Fortunately her pain is doing fairly well. She still has exposed muscle in the right medial/tibial area. 12/14; TELEHEALTH; she was changed to [facility] to the right calf wound and right lateral ankle wound when she was here last time. Unfortunately since then she had a fall with a pelvic fracture and a fracture of her wrist. She was apparently hospitalized for 5 days. I have not looked at her discharge summary. She apparently came out of the hospital with a blister on her right heel. She was seen today via telehealth by myself and our case manager. The patient and her husband were present. She has been using Hydrofera Blue at our direction from the last time she was in the clinic. There is been no major improvement in fact the areas appear deeper and with a less viable surface 01/04/2020; TELEHEALTH; the patient was seen today in accompaniment of her husband and our nurse. She has 3 open areas 1 on the right medial mid tibia, one on the right lateral ankle and a large eschared pressure area on the right heel. We have been using Iodoflex to the 2 original wounds. 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 It is simply too perilous for her to be seen in any other way 1/26; TELEHEALTH; the patient was seen today in accompaniment of her husband and our RN one of our nurses. She still has the 3 open areas 1 on the right medial mid tibia which is the remanent of a more extensive wound in this area, one on the right lateral ankle and a large eschared pressure area on the right heel. We have been using Iodoflex to the 2  original wounds and a bed at 9 application to the eschared area on the heel 2/15; the first time we have seen this patient and then several months out of concern for the pandemic. She had a large horizontal wound in the mid tibia. Only the medial aspect of this is still open. Area on the lateral ankle is just about closed. She had a new pressure ulcer on the right heel which I have removed some of the eschar. We have been using Iodoflex which I will continue. The area in the mid tibia has a round circle in the middle of exposed muscle. I think we would have to use an advanced treatment product to stimulate granulation over this area. We will run this through her insurance. She is not eligible for plastic surgery for many reasons 3/1; TELEHEALTH. The patient was seen today by telehealth. 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 We have been using Iodoflex to the wound areas which include the right anterior mid tibia, right lateral ankle and the right heel. All of these were reviewed 3/8; the patient's wound just above the right ankle is closed. She still has a contracting black eschar on the heel where she had a pressure ulcer. The medial part of her original wound on the mid tibia has exposed muscle. We have made really made no progress in this area although we have managed to get a lot of the original wound in this area to close. We used Apligraf #1 today 3/22; the patient's ankle wound remains closed. She still has a contracting black eschar on the heel although it seems to have less surface area. It still not clear whether there is any depth here. We used Apligraf #2  today in an attempt to get granulation over the exposed muscle and what is left of her mid tibia area. 4/5; everything is closed except the medial aspect of the mid tibia wound, as well as the pressure injury on the right heel. She has been  using Betadine to the right heel which has been gradually contracting. We used Apligraf #3 today. 4/19; Apligraf #4. Still a pressure area on the right heel she has been using Betadine superficial excoriated areas she has been applying salicylic acid to on the anterior leg and the thick horn on the leg just above her wound area 5/3; Apligraf #5. Unfortunately she came in today with a reopening of the area on the right lateral lower leg. Not much change in the wound we have been treating in the mid calf. Quite a bit of edema surrounding this wound. She reports that she was up on her feet quite a bit. The area on the right heel is separating she has been using Betadine She comes in with a new wound on the left lateral malleolus which is very disappointing this is been open since last week 5/17; we applied her last Apligraf last time. Unfortunately that did not have any effect on the deep area medially in the mid tibial area. However the area over the right lateral malleolus is a lot better. Right heel also is contracted we used Iodoflex last time. The new wound on the left lateral malleolus were using Santyl to. This required debridement. 6/1. Not much change in the area on the right medial mid tibia. Right lateral ankle is improved she has the necrotic area on the right heel which was a pressure area. New area on the left lateral malleolus from last time. I changed her to Iodoflex to the area on the left lateral malleolus she said this hurt I have put her back on Santyl 6/15; right mid tibia is unchanged. I do not think there is anything I can do to this topically that we will get this to granulate over the muscle. And I am furthermore I am not sure that she is a surgical candidate either because of her severe pulmonary issues or because she really does not want to go through it. The area on the right lateral malleolus is a small punched out area. The area on the left lateral malleolus again small  punched out with nonviable surface Pressure ulcer on the right heel at separating black eschar. I went ahead and remove this today she has an area on the left anterior mid tibia just laterally. Geographic wounds debris on the surface. Some erythema here. 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 There is skin changes widely Change the primary dressing to Sorbact to all wound areas. She needs compression on the left leg as well as the right 6/21; right mid tibia which was her one remaining wound at 1 point is unchanged The area on the right lateral malleolus actually is close to closing over still a small punched out area The area on the left lateral malleolus again a deep wound it is this 1 that she feels pain in. Pressure ulcer on the right heel was cultured today. New area on the left anterior mid tibia several geographic wounds last week in the setting of chronic venous insufficiency this actually looks some better 7/6; Right mid tibia about the same. Exposed muscle Left lateral malleolus again necrotic debris over the surface. Pressure ulcer on the right heel. She finished  the Keflex we prescribed. Necrotic debris debrided with a #3 curette The rest of her wounds on the right mid tibia right lateral ankle are better Her husband reminds me that she had a right femoral endarterectomy and has 2 stents in her left thigh placed in 2011 approximately by vascular surgery in [place]. She saw Dr. [name] last in February 2020. At that point he did not think that the arterial insufficiency she had on the right was sufficient to explain any of her right lower extremity wounds however she now has an area on the left lateral malleolus that looks like an ischemic wound 7/12; we have been using Sorbact all wound areas Right mid tibia about the same exposed muscle Left lateral malleolus small wound with debris in the surface punched out Pressure ulcer of the right heel requiring  debridement of necrotic debris Lateral ankle wound on the right is just about closed Right mid tibia wound about the same still with exposed muscle. 7/26 really no change in any wounds. We have been using Sorbact. She has bilateral lower extremity wounds. This is in the setting of scleroderma, severe venous hypertension. She also has known PAD and I had really hoped to be able to get a review by Dr. [name] but apparently that is not booked until August 31. I have asked her to call back to vein and vascular and get an appointment with somebody a little earlier if that is possible. 8/16; patient's area on the right ankle which was a chronic wound is closed over again. The area on the right mid lower leg, right heel left lateral ankle are all about the same. Pressure ulcer on the right heel is still not viable. New areas identified on the right lateral calf. She has a follow-up with Dr. [name] on 8/31. Phenomenally I would like him to go over her arterial status with regards to her underlying stents. 9/13; patient had her ARTERIAL studies done however apparently Dr. [name] is now in [place] so she did not get an appointment with him on 8/31 and was not referred to any other provider. On the right side her PTA was monophasic. Noncompressible. TBI on the right at 0.41 on the left she was monophasic and noncompressible they did not do a TBI because she had a Band-Aid on her big toe. Patient does not describe claudication although she is having a lot of pain in the left lateral malleolus. 10/11; the patient had a right-sided interventions on 10/5. She had a angioplasty of the right anterior tibial artery as well as the tibioperoneal trunk/peroneal artery she had a drug-coated balloon angioplasty of the right superficial femoral artery. On the left she did not have any interventions yet but is going to have another look at this in 2 weeks. The superficial femoral artery on the left and the associated stent  are patent popliteal artery is patent to the dominant vessel runoff is the anterior tibial which has several high-grade stenosis in the proximal one third. We have been using Sorbact. She also has a new area on the right upper mid tibia. 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 She tolerated this well. She is using Iodoflex and her husband is doing a kerlix Coban on her. She arrives in clinic  today with the original medial part of her mid tibia wound with muscle exposure small area on the right ankle which was part of her original wound she has 2 punched-out areas laterally and 1 above her area anteriorly. Nonviable surfaces similarly the right heel is nonviable. On the left side she has an area on the left foot and the left lateral ankle similar nonviable surfaces. Electronic Signature(s) Signed: 10/31/2020 5:37:06 PM By: Linton Ham MD Entered By: Linton Ham on 10/31/2020 13:58:23 -------------------------------------------------------------------------------- Physical Exam Details Patient Name: Date of Service: [name], [name] 10/31/2020 12:30 PM Medical Record Number: [id number] Patient Account Number: [id number] Date of Birth/Sex: Treating RN: 1951-03-04 (69 y.o. [name] Primary Care Provider: [name] Other Clinician: Referring Provider: Treating Provider/Extender: [name] in Treatment: 37 Constitutional Patient is hypertensive.. Pulse regular and within target range for patient.Marland Kitchen Respirations regular, non-labored and within target range.. Temperature is normal and within the target range for the patient.Marland Kitchen Appears in no distress. Cardiovascular She has dorsalis pedis pulses  bilaterally left greater than right. Significant swelling of the right calf which is pitting. Notes Wound exam Midportion of the right mid tibia part of her original wound this is not really changed. On the right lateral she has 2 punched-out areas in the third punched-out area on the right anterior. Although these covered in nonviable surface material. On the right lateral ankle part of her original wound although this is not that ominous. Right medial heel which was initially termed a pressure ulcer also would require debridement but I did not do any of this today Left lateral foot and left lateral ankle very similar presentations to the wounds on the right Electronic Signature(s) Signed: 10/31/2020 5:37:06 PM By: Linton Ham MD Entered By: Linton Ham on 10/31/2020 13:59:49 -------------------------------------------------------------------------------- Physician Orders Details Patient Name: Date of Service: [name], [name] 10/31/2020 12:30 PM Medical Record Number: [id number] Patient Account Number: [id number] Date of Birth/Sex: Treating RN: 03/30/1951 (69 y.o. 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 Dressing Change  Frequency Change Dressing every other day. - left heel and left foot wound Do not change entire dressing for one week. - wounds on right leg and heel Skin Barriers/Peri-Wound Care Moisturizing lotion - both legs Wound Cleansing May shower with protection. Primary Wound Dressing Wound #16 Right Calcaneus Iodoflex Wound #19 Right,Lateral Malleolus Iodoflex Wound #20 Left,Lateral Malleolus Iodoflex Wound #23 Right,Posterior Lower Leg Iodoflex Wound #24 Right,Anterior Lower Leg Iodoflex Wound #25 Left,Lateral Foot Iodoflex Wound #26 Right,Lateral Lower Leg Iodoflex Wound #5 Right,Medial Lower Leg Iodoflex Secondary Dressing Dry Gauze - all wounds Edema Control Kerlix and Coban - Left Lower Extremity 3 Layer Compression System - Right Lower Extremity Avoid standing for long periods of time Elevate legs to the level of the heart or above for 30 minutes daily and/or when sitting,  a frequency of: - throughout the day Off-Loading Turn and reposition every 2 hours Other: - float heels off of bed/chair with pillow under calves Electronic Signature(s) Signed: 10/31/2020 5:37:06 PM By: Linton Ham MD Signed: 10/31/2020 5:57:05 PM By: Levan Hurst RN, BSN Entered By: Levan Hurst on 10/31/2020 13:50:23 -------------------------------------------------------------------------------- Problem List Details Patient Name: Date of Service: [name], [name] 10/31/2020 12:30 PM Medical Record Number: [id number] Patient Account Number: [id number] Date of Birth/Sex: Treating RN: [birth year] (69 y.o. 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 02/24/2015 No Yes I87.331 Chronic venous hypertension (idiopathic) with ulcer and inflammation of right 10/04/2016 No Yes lower extremity L89.616 Pressure-induced deep tissue damage of right heel 12/14/2019 No Yes L97.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 05/31/2020 No Yes I70.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 10/31/2020 No Yes lower leg I70.248 Atherosclerosis of native arteries of left leg with ulceration of other part of 10/31/2020 No Yes lower leg Inactive Problems ICD-10 Code Description Active Date Inactive Date 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 10/31/2020 12:30 PM Medical Record Number: [id number] Patient Account Number: [id number] Date of Birth/Sex: Treating RN: 01/19/1951 (69 y.o. [name] Primary Care Provider: [name] Other Clinician: Referring Provider: Treating Provider/Extender: [name] in Treatment: 449 Subjective History of Present Illness (HPI) this is a patient who initially came to [place] for wounds on the medial malleoli bilaterally as well as her upper medial lower extremities bilaterally. These  wounds eventually healed with assistance of Apligraf's bilaterally. While this was occurring she developed the current wound which opened into a fairly substantial wound on the right lower extremity. These are mostly secondary to venous stasis physiology however the patient also has underlying scleroderma, pulmonary hypertension. The wound has been making good progress lately with the Hydrofera Blue-based dressing. 03/17/2015; patient had a arterial evaluation a year ago. Her right ABI was 0.86 left was 1.0. T brachial index was 0.41 on the right 0.45 on the left. Her oe bilateral common femoral artery waveforms were triphasic. Her white popliteal posterior tibial artery and anterior tibial artery waveforms were monophasic with good amplitude. Luteal artery waveforms were biphasic it was felt that her bilateral great toe pressures are of normal although adequate for tissue healing. 03/24/2015. The condition of this wound is not really improved that. He was covered with as fibrinous surface slough and eschar. This underwent an aggressive debridement with both a curette and scalpel. I still cannot really get down to what I can would consider to be a viable surface. There is no evidence of infection.  Previous workup for ischemia roughly a year ago was negative nevertheless I think that continues to be a concern 04/07/15. The patient arrived for application of second Apligraf. Once again the surface of this wound is certainly less viable than I would like for an advanced treatment option. An aggressive debridement was done. She developed some arteriolar bleeding which required that along pressure and silver alginate. 04/21/15: change in the condition of this wound. Once again it is covered in a gelatinous surface slough. After debridement today surface of the wound looks somewhat better but now a heeling surface 05/05/15 Apligraf #4 applied.Still a lot of slough on this wound. 05/19/15 Apligraf #5 applied. Still  a lot of slough on this wound I did not aggressively remove this 06/02/15; continued copious amounts of surface slough. This debridement fairly easily. 06/08/2015 -- the last time she had a venous duplex study done was over 3 years ago and the surgery was prior to that. I have recommended that she sees Dr. early for a another opinion regarding a repeat venous duplex and possibly more endovenous ablation of vein stripping of micro-phlebotomies. 06/16/15; wound has a gelatinous surface eschar that the debridement fairly easily to a point. I don't disagree with the venous workup and perhaps even arterial re-evaluation. She is on prednisone 5 mg and continue his medications for her pulmonary artery hypertension I am not sure if the latter have any wound care healing issues I would need to investigate this. 06/23/15 continues with a gelatinous surface eschar with of fibrinous underlying. What I can see of this wound does not look unhealthy however I just can't get this material which I think is 2 different layers off. Empiric culture done 06/30/15; unfortunately not a lot of change in this wound. A gelatinous surface eschar is easily removed however it has a tight fibrinous surface underneath the. 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 The wound comes back and basically and unchanged state the. She has a gelatinous surface that is more easily removed however there is a tightly fibrinous surface underneath the. There is no evidence of infection. She has a vascular follow-up next month. I would have to inject her in order to do a more aggressive debridement of this area 07/21/15 the wound is roughly in the same state albeit the debridement is done with greater ease. There is less of the fibrinous eschar underneath the. There is no evidence of infection. She has follow-up with vascular surgery next week. No evidence of surrounding infection. Her original distal wounds healed while this one  formed. 07/28/15; wound is easier to debride. No wound erythema. She is seeing Dr. [name] next week. 08/18/15 Has been to [facility] for repeat ablation. Have been using medihoney pad with some improvement 08/25/15; absolutely no change in the condition of this wound in either its overall size or surface condition in many months now. At one point I had this down to [place] healthier surface I think with [facility] however this did not actually progress towards closure. Do not believe that the wound has actually deteriorated in terms of volume at all. We have been using a medihoney pad which allows easier debridement of the gelatinous eschar but again no overall actual improvement. the patient is going towards an ablation with pain and vascular which I think is scheduled for next week. The only other investigation that I could first see would be a biopsy. She does have underlying scleroderma 09/02/15 eschar is  much easier to debridement however the base of this does not look particularly vibrant. We changed to Iodoflex. The patient had her ablation earlier this week 09/09/15 again the debridement over the base of this wound is easier and the base of the wound looks considerably better. We will continue the Iodoflex. Dr. Tawni Millers has expressed his satisfaction with the result of her ablation 09/15/15 once again the wound is relatively free of surface eschar. No debridement was done today. It has a pale-looking base to it. although this is not as deep as it once was it seems to be expanding especially inferiorly. She has had recent venous ablation but this is no closer to healing.I've gone ahead and done a punch biopsy this from the inferior part of the wound close to normal skin 09/22/15: the wound is relatively free of surface eschar. There is some surrounding eschar. I'm not exactly sure at what level the surface is that I am seeing. 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 No evidence  of atypical infection, malignancy. 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 No debridement was done 09/29/15; the wound surface is relatively free of surface eschar. There is eschar surrounding the walls of the wound. Aside from the improvement in the amount of surface slough. This wound has not progressed any towards closure. There is not even a surface that looks like there at this is ready. There is no evidence of any infection nor maligancy based on biopsy I did on 9/15. I continued with the Iodoflex however I am looking towards some alternative to try to promote some closure or filling in of this surface. Consider triple layer Oasis. Collagen did not result in adequate control of the surface slough 10/13/15; the patient was in hospital last week with severe anemia. The wound looks somewhat better after debridement although there is widening medially. There is no evidence of infection. 10/20/15; patient's wound on the right lateral lower leg is essentially unchanged. This underwent a light surface debridement and in general the debridement is easier and the surface looks improved. I noted in doing this on the side of the wound what appears to be a piece of calcium deposition. The patient noted that she had previous things on the tips of her fingers. 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 She follows with rheumatology and I have asked them to talk to her about this. In view of that S the nonhealing ulcer may have something to do with calcinosis and also unrelieved Raynaud's disease in this area. I should note that her original wounds on the right and left medial malleolus and the inner aspects of both legs just below the knees did however heal over 10/30/15; the patient's wound on the right lateral lower leg is essentially unchanged. I was able to remove some calcified material from the  medial wound edge. 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 Otherwise the wound appears essentially unchanged there is less adherent surface eschar. Some of the calcified material was sent to pathology for analysis 11/17/15. The patient's wound on the right lateral leg is essentially unchanged. Wider Medially. 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 There is  some surface slough but I did not debridement this. No evidence of surrounding infection. 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 This will make healing difficult 12/15/15; the wound has less depth surface has a fibrinous slough and calcifications in the wound edge no evidence of infection 12/22/15; the wound definitely has less Fibrinous slough on the base. Calcifications around the wound edges are still evident. Although the wound bed looks healthier it is still pale in appearance. Previous biopsy did not show malignancy 01/04/15; surgical debridement of nonviable slough and subcutaneous tissue the wound cleans up quite nicely but appears to be expanding outward calcifications around the wound edges are still there. 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 She is to see her rheumatologist I'll see if he has any opinions on this. My punch biopsy done in September did not show calcifications although these are clearly evident. 01/19/16 light selective debridement of nonviable surface slough. There is epithelialization medially. This gives me reason for cautious optimism. She has been to see her rheumatologist, there is nothing that can be done for this type of soft  tissue calcification associated with scleroderma 02/02/16 no debridement although there is a light surface slough. She has 2 peninsulas of skin 1 inferiorly and one medially. We continue to make a slight and slow but definite progressive here 02/16/16; light surface debridement with more attention to the circumference of the wound bed where the fibrinous eschar is more prevalent. No calcifications detected. She seems to have done nicely with the [facility] with some epithelialization and some improvement in the overall wound volume. 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 Continued attention to the circumference of the wound where the fibrinous eschar in calcinosis or prevalent. No calcifications were detected. She has continued improvement with Hydrofera Blue. The wound is no longer as deep 03/15/16 surgical debridement done to remove surface escha Especially around the circumference of the wound where there is nonviable subcutaneous tissue. In spite of this there is considerable improvement in the overall dimensions and depth of the wound. [place] of epithelialization are seen especially medially inferiorly and superiorly to a lesser extent. She is using Hydrofera Blue at home 03/29/16; surgical debridement done to remove surface eschar and nonviable subcutaneous tissue. This cleans up quite nicely mention slightly larger in terms of length and width however depth is less 04/12/16; continued gradual improvement in terms of depth and the condition of the wound base. Debridement is done. Continuing long standing Hydrofera Blue at home with Kerlix Coban wraps 04/26/16; continued gradual improvement in terms of depth and management as well as condition of the wound base. Surgical debridement done she continues with Hydrofera Blue. 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 She initially came to this clinic venous insufficiency ulcers which have long since healed 05/17/16 continued improvement in terms of the depth and measurements of this wound although she has a tightly adherent fibrinous slough each time. We've been continued with long standing Hydrofera Blue which seems to done as well for this wound is many advanced treatment options. 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 She also has chronic venous insufficiency. She has an irritation on her lateral right ankle secondary to our wraps 05/10/16; wound appears to be smaller especially on  the medial aspect and especially in the width. Wound was debridement surface looks better. She is also been in the hospital apparently with anemia again she tells me she had an endoscopy. Since she got home after 3 days which I believe was sometime last week she has had an irritated painful area on the right lateral ankle surrounding the lateral malleolus 05/31/16; much more adherent surface slough today then recently although I don't think the dimensions of changed that much. A more aggressive debridement is required. The irritated area over her medial malleolus is more pruritic and painful and I don't think represents cellulitis 06/14/16; no major change in her wound dimensions however there is more tightly adherent surface slough which is disappointing. As well as she appears to have a new small area medially. Furthermore an irritated uncomfortable area on the lateral aspect of her right foot just below the lateral malleolus. 06/21/16; I'm seeing the patient back in follow-up for the new areas under her major wound on the right anterior leg. She has been using Hydrofera Blue to this area probably for several months now and although the dressings seem to be helping for quite a period of time I think things have stagnated lately. She comes in today with a  relatively tight adherent surface slough and really no changes in the wound shape or dimension. The 2 small areas she had inferiorly are tiny but still open they seem improved this well. There is no uncontrolled edema and I don't think there is any evidence of cellulitis. 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 Patient has had vascular evaluations both venous and arterial. I have biopsied this area. There is no obvious infection. The worrisome thing today is that she seems to be developing areas of erythema and epithelial damage on the medial aspect of the right foot. Also to a lesser degree inferior to the actual wound itself. Again I see no obvious changes to suggest cellulitis however as this is the only treatable option I will probably give her antibiotics. 07/13/16 no major change in the lady's large anterior right leg wound. Still covered with a very tightly adherent surface slough which is difficult to debridement. There is less erythema around this, culture last week grew pseudomonas I gave her ciprofloxacin. The area on her lateral right malleolus looks better- 08/02/16 the patient's wounds continued to decline. Her original large anterior right leg wound looks deeper. Still adherent surface slough that is difficult to debridement. She has a small area just below this and a punched-out wound just below her lateral malleolus. In the meantime she is been in hospital with apparently an upper GI bleed on Plavix and aspirin. She is now just on Plavix she received 3 units of packed cells 08/23/16; since I last saw this 3 weeks ago, the open large area on her right leg looks about the same syrup. She has a small satellite lesion just underneath this. The area on her medial right ankle is now a deep necrotic wound. I attempted to debridement this however there is just too much pain. It is  difficult to feel her peripheral pulses however I think a lot of this may be vasospasm and micro-calcinosis. She follows with vascular surgery and is scheduled for an angiogram in early September 09/06/16; the patient is going for an arteriogram tomorrow. Her original large wound on the right calf is about  the same the satellite lesion underneath it is about the same however the area on her medial ankle is now deeper with exposed tendon. I am no longer attempting to debride these wounds 09/20/16; the patient has undergone a right femoral endarterectomy and Dacron patch angioplasty. This seems to have helped the flow in her right leg. 10/04/16; Arrived today for aggressive debridement of the wounds on her right calf the original wound the one beneath it and a difficult area over her right lateral leg just above the lateral malleolus. 10/25/16; her 3 open wounds are about the same in terms of dimensions however the surface appears a lot healthier post debridement. Using Iodoflex 10/18/16 we have been using Iodoflex to her wounds which she tolerated with some difficulty. 10/11/16; has been using Santyl for a period of time with some improvement although again very adherent surface slough would prevent any attempted healing this. She has a original wound on the left calf, the satellite underneath that and the most recent wound on the right medial ankle. She has completed revascularization by Dr. early and has had venous ablation earlier. Want to go back to Iodoflex to see if week and get a healthier surface to this wound bed failing this I think she'll need to be taken to the OR and I am prepared to call Dr. Marla Roe to discuss this. She is obviously not a good candidate for general anesthesia however.; 11/08/16; I put her on Iodoflex last time to see if I can get the wound bed any healthier and unfortunately today still had tremendous surface slough. 11/15/16; 4 weeks' worth of Iodoflex with not much  improvement. Debridement on the major wounds on her left anterior leg is easier however this does not maintain from week to week. The punched-out area on her medial right ankle 11/29/16; I attempted to change the patient last visit to [facility] however she states this burned and was very uncomfortable therefore we gave her permission to go back to the eye out of complex which she already had at home. Also she noted a lot of pain and swelling on the lateral aspect of her leg before she traveled to [facility] for the holidays, I called her in doxycycline over the phone. This seems to have helped 12/06/16; Wounds unchanged by in large. Using Iodoflex 12/13/16; her wounds today actually looks somewhat better. The area on the right lateral lower leg has reasonably healthy-looking granulation and perhaps as actually filled and a bit. Debridement of the 2 wounds on the medial calf is easier and post debridement appears to have a healthier base. We have referred her to Dr. Migdalia Dk for consideration of operative debridement 12/20/16; we have a quick note from Dr. Merri Ray who feels that the patient needs to be referred to an academic center/plastic surgeon. This is due to the complexity of the patient's medical issues as it applies to anything in the OR. We have been using Iodoflex 12/27/16; in general the wounds on the right leg are better in terms of the difficult to remove surface slough. She has been using Iodoflex. She is approved for Apligraf which I anticipated ordering in the next week or 2 when we get a better-looking surface 01/04/16 the deep wounds on the right leg generally look better. Both of them are debrided further surface slough. The area on the lateral right leg was not debrided. She is approved for Apligraf I think I'll probably order this either next week or the week after depending  on the surface of the wounds superiorly. We have been using Iodoflex which will  continue until then 01/11/16; the deep wounds on the right leg again have a surface slough that requires debridement. I've not been able to get the wound bed on either one of these wounds down to what would be acceptable for an advanced skin stab-like Apligraf. The area on the medial leg has been improving. We have been using hot Iodoflex to all wounds which seems to do the best at at least limiting the nonviable surface 01/24/17; we have continued Iodoflex and all her wound areas. Her debridement Gen. he is easier and the surface underneath this looks viable. Nevertheless these are large area wounds with exposed muscle at least on the anterior parts. We have ordered Apligraf's for 2 weeks from now. The patient will be away next week 02/07/17; the patient was close to have first Apligraf today however we did not order one. I therefore replaced her Iodoflex. She essentially has 3 large punched- out areas on her right anterior leg and right medial ankle. 02/11/17; Apligraf #1 02/25/17; Apligraf #2. In general some improvement in the right medial ankle and right anterior leg wounds. The larger wound medially perhaps some better 03/11/17; Apligraf #3. In general continued improvement in the right medial ankle and the right anterior leg wounds. 03/25/17 Apligraf #4. In general continued improvement especially on the right medial ankle and the lower 04/08/17; Apligraf #5 in general continued improvement in all wound sites. 04/22/17; post Apligraf #5 her wound beds continued to look a lot better all of them up to the surface of the surrounding skin. Had a caramel-colored slough that I did not debrided in case there is residual Apligraf effect. The wounds are as good as I have seen these looking quite some time. 04/29/17; we applied [place] and last week after completing her Apligraf. Wounds look as though they've contracted somewhat although they have a nonviable surface which was problematic in the past.  Apparently she has been approved for further Apligraf's. We applied Iodosorb today after debridement. 05/06/17; we're fortunate enough today to be able to apply additional Apligraf approved by her insurance. In general all of her wounds look better Apligraf #6 05/24/17; Apligraf #7 continued improvement in all wounds o3 06/10/17 Apligraf #8. Continued improvement in the surface of all wounds. Not much of an improvement in dimensions as I might a follow 06/24/17 Apligraf #9 continued general improvement although not as much change in the wound areas I might of like. She has a new open area on the right anterior lateral ankle very small and superficial. She also has a necrotic wound on the tip of her right index finger probably secondary to severe uncontrolled Raynaud's phenomenon. She is already on sildenafil and already seen her rheumatologist who gave her Keflex. 07/08/17; Apligraf #10. Generalized improvement although she has a small additional wound just medial to the major wound area. 07/22/17; after discussion we decided not to reorder any further Apligraf's although there is been considerable improvement with these it hasn't been recently. The major wound anteriorly looks better. Smaller wounds beneath this and the more recent one and laterally look about the same. The area on the right lateral lower leg looks about the same 07/29/17 this is a patient who is exceedingly complex. She has advanced scleroderma, crest syndrome including calcinosis, PAD status post revascularization, chronic venous insufficiency status post ablations. She initially presented to this clinic with wounds on her bilateral lower legs however  these closed. More recently we have been dealing with a large open area superiorly on the right anterior leg, a smaller wound underneath this and then another one on the right medial lower extremity. These improve significantly with 10 Apligraf applications. Over the last 2-3 weeks we are  making good progress with Hydrofera Blue and these seem to be making progress towards closure 08/19/17; wounds continued to make good improvement with Hydrofera Blue and episodic aggressive debridements 08/26/17; still using Hydrofera Blue. Good improvements 09/09/17; using Hydrofera Blue continued improvement. Area on the lateral part of her right leg has only a small remaining open area. The small inferior satellite region is for all intents and purposes closed. Her major wound also is come in in terms of depth and has advancing epithelialization. 09/16/17; using Hydrofera Blue with continued improvement. The smaller satellite wound. We've closed out today along with a new were satellite wound medially. The area on her medial ankle is still open and her major wound is still open but making improvement. All using Hydrofera Blue. Currently 09/30/17; using Hydrofera Blue. Still a small open area on the lateral right ankle area and her original major wound seems to be making gradual and steady improvement. 10/14/17; still using Hydrofera Blue. Still too small open areas on the right lateral ankle. Her original major wound is horizontal and linear. The most problematic area paradoxically seems to be the area to the medial area wears I thought it would be the lateral. 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 10/28/17; still using Hydrofera Blue. Right lateral ankle has a very small open area superiorly on the most lateral part of the wound. Her original open wound has 2 open areas now separated by normal skin and we've redefined this. 11/11/17; still using Hydrofera Blue area and right lateral ankle continues to have a small open area on mostly the lateral part of the wound. Her original wound has 2 small open areas now separated by a considerable amount of normal skin 11/28/17; the patient called in slightly before Thanksgiving to report pain and erythema  above the wound on the right leg. In the past this is responded well to treatment for cellulitis and I gave her over the phone doxycycline. She stated this resulted in fairly abrupt improvement. We have been using Hydrofera Blue for a prolonged period of time to the larger wound anteriorly into the remaining wound on her right right lateral ankle. The latter is just about closed with only a small linear area and the bottom of the [place]. 12/02/17; use endoform and left the dressing on since last visit. There is no tenderness and no evidence of infection. 12/16/17; patient has been using Endoform but not making much progress. The 2 punched out open areas anteriorly which were the reminiscence of her major wound appear deeper. The area on the lateral aspect of her right calf also appears deeper. Also she has a puzzling tender swelling above her wound on the right leg. This seems larger than last time. Just above her wounds there appears to be some fluctuance in this area it is not erythematous and there is no crepitus 12/30/17; patient has been using Endoform up until last week we used Hydrofera Blue. Ultrasound of the swelling above her 2 major wounds last time was negative for a fluid collection. I gave her cefaclor for the erythema and tenderness in this area which seems better. Unfortunately both punched out areas anteriorly and  the area on her right medial lower leg appear deeper. In fact the lateral of the wounds anteriorly actually looks as though it has exposed tendon and/or muscle sheath. She is not systemically unwell. She is complaining of vaginitis type symptoms presumably Candida from her antibiotics. 01/06/18; we're using santyl. she has 2 punched out areas anteriorly which were initially part of a large wound. Unfortunately medially this is now open to tendon/muscle. All the wounds have the same adherent very difficult to debride surface. 01/20/18; 2 week follow-up using Santyl. She has the 2  punched out areas anteriorly which were initially part of her large surface wound there. Medially this still has exposed muscle. All of these have the same tightly adherent necrotic surface which requires debridement. PuraPly was not accepted by the patient's insurance however her insurance I think it changed therefore we are going to run Apligraf to gain 02/03/18; the patient has been using Santyl. The wound on the right lateral ankle looks improved and the 2 areas anteriorly on the right leg looks about the same. The medial one has exposed muscle. The lateral 1 requiresdebridement. We use PuraPly today for the first week 02/10/18; PuraPly #2. The patient has 3 wounds. The area on the right lateral ankle, 2 areas anteriorly that were part of her original large wound in this area the medial one has exposed muscle. All of the wounds were lightly debrided with a number 3 curet. PuraPly #2 applied the lateral wound on the calf and the right lateral ankle look better. 02/17/18; PuraPly #3. Patient has 3 wounds. The area on the right lateral ankle in 2 areas internally that were part of her original large wound. The lateral area has exposed muscle. She arrived with some complaints of pain around the right ankle. 02/24/18; PuraPly #4; not much change in any of the 3 wound areas. Right lateral ankle, right lateral calf. Both of these required debridement with a #3 curet. She tolerates this marginally. The area on the medial leg still has exposed muscle. Not much change in dimensions 03/03/18 PuraPly #5. The area on the medial ankle actually looks better however the 2 separate areas that were original parts of the larger right anterior leg wound look as though they're attempting to coalesce. 03/10/18; PuraPly #6. The area on the medial ankle actually continues to look and measures smaller however the 2 separate areas that were part of the original large wound on the right anterior leg have now coalesced. There hasn't  been much improvement here. The lateral area actually has underlying exposed muscle 03/17/18-she is here in follow-up evaluation for ulcerations to the right lower extremity. She is voicing no complaints or concerns. She tolerated debridement. Puraply#7 placed 03/24/18; difficult right lower extremity ulcerations. PuraPly #8 place. She is been approved for [employer]. She did very well with Apligraf today however she is apparently reached her "lifetime max" 03/31/18; marginal improvement with PuraPly although her wounds looked as good as they have in several weeks today. Used TheraSkins #1 04/14/18 TheraSkin #2 today 04/28/18 TheraSkins #3. Wound slightly improved 05/12/18; TheraSkin skin #4. Wound response has been variable. 05/27/18 TheraSkin #5. Generally improvement in all wound areas. I've also put her in 3 layer compression to help with the severe venous hypertension 06/09/18; patient is done quite well with the TheraSkins unfortunately we have no further applications. I also put her in 3 layer compression last week and that really seems to of helped. 06/16/18; we have been using silver collagen. Wounds are  smaller. Still be open area to the muscle layer of her calf however even that is contracted somewhat. She tells me that at night sometimes she has pain on the right lateral calf at the site of her lower wound. Notable that I put her into 3 layer compression about 3 weeks ago. She states that she dangles her leg over the bed that makes it feel better but she does not describe claudication during the day ooShe is going to call her secondary insurance to see if they will continue to cover advanced treatment products I have reviewed her arterial studies from 01/22/17; this showed an ABI in the right of 1 and on the left noncompressible. TBI on the right at 0.30 on the left at 0.34. It is therefore possible she has significant PAD with medial calcification falsely elevating her ABI into the normal range.  I'll need to be careful about asking her about this next week it's possible the 3 layer compression is too much 06/23/18; was able to reapply TheraSkin 1 today. Edema control is good and she is not complaining of pain no claudication 07/07/18;no major change. New wound which was apparently a taper removal injury today in our clinic between her 2 wounds on the right calf TheraSkins #2 07/14/18; I think there is some improvement in the right lateral ankle and the medial part of her wound. There is still exposed muscle medially. 07/28/18; two-week follow-up. TheraSkins#3. Unfortunately no major change. She is not a candidate I don't think for skin grafting due to severe venous hypertension associated with her scleroderma and pulmonary hypertension 08/11/18 Patient is here today for her [employer] application #14 (#5 of the second set). She seems to be doing well and in the base of the wound appears to show some progress at this point. This is the last approved [employer] of the second set. 08/25/18; she has completed TheraSkin. There has been some improvement on the right lateral calf wound as well as the anterior leg wounds. The open area to muscle medially on the anterior leg wound is smaller. I'm going to transition her back to [facility] under Kerlix Coban change every second day. She reports that she had some calcification removed from her right upper arm. We have had previous problems with calcifications in her wounds on her legs but that has not happened recently 09/08/18;using Hydrofera Blue on both her wound areas. Wounds seem to of contracted nicely. She uses Kerlix Coban wrap and changes at home herself 09/22/2018; using Hydrofera Blue on both her wound areas. Dimensions seem to have come down somewhat. There is certainly less depth in the medial part of the mid tibia wound and I do not think there is any exposed muscle at this point. 10/06/2018; 2-week follow-up. Using [facility] on her wound  areas. Dimensions have come down nicely both on the right lateral ankle area in the right mid tibial area. She has no new complaints 10/20/2018; 2-week follow-up. She is using Hydrofera Blue. Not much change from the last time she was here. The area on the lateral ankle has less depth although it has raised edges on one side. I attempted to remove as much of the raised edge as I could without creating more additional wounds. The area on the right anterior mid tibia area looks the same. 11/03/2018; 2-week follow-up she is using Hydrofera Blue. On the right anterior leg she now has 2 wounds separated by a large area of normal skin. The area on the medial  part still has I think exposed muscle although this area itself is a lot smaller. The area laterally has some depth. Both areas with necrotic debris. ooThe area on her right lateral ankle has come in nicely 11/17/2018; patient continues to use Hydrofera Blue. We have been increasing separation of the 2 wounds anteriorly which were at one point joint the area on the right lateral calf continues to have I think some improvement in depth. 12/08/2018; patient continues to use Hydrofera Blue. There is some improvement in the area on the right lateral calf. The 2 areas that were initially part of the original large wound in the mid right tibia are probably about the same. In fact the medial area is probably somewhat larger. We will run puraply through the patient's insurance 12/22/2018; she has been using Hydrofera Blue. We have small wounds on the right lateral calf and 2 small areas that were initially part of a large wound in the right mid tibia. We applied pure apply #1 today. 12/29/2018; we applied puraply #2. Her wounds look somewhat better especially on the right lateral calf and the lateral part of her original wound in the mid tibial area. 01/05/2019; perhaps slightly improved in terms of wound bed condition but certainly not as much improvement as I  might of liked. Puraply #3 1/13: we did not have a correct sized puraply to apply. wounds more pinched out looking, I increased her compression to 3 layer last week to help with significant multilevel venous hypertension. Since then I've reviewed her arterial status. She has a right femoral endarterectomy and a distal left SFA stent. She was being followed by Dr. [name] for a period however she does not appear to have seen him in 3 years. I will set up an appointment. 1/20. The patient has an additional wound on the right lateral calf between the distal wound and proximal wounds. We did not have Puraply last week. Still does not have a follow-up with Dr. early 1/27: Follow-up with Dr. early has been arranged apparently with follow-up noninvasive studies. Wounds are measuring roughly the same although they certainly look smaller 2/3; the patient had non invasive studies. Her ABIs on the right were 0.83 and on the left 1.02 however there was no great toe pressure bilaterally. Also worrisome monophasic waveforms at the PTA and dorsalis pedis. We are still using Puraply. We have had some improvement in all of the wounds especially the lateral part of the mid tibial area. 2/10; sees Dr. early of vein and vascular re-arterial studies next week. Puraply reapplied today. 2/17; Dr. early of vein and vascular his appointment is tomorrow. Puraply reapplied after debridement of all wounds 2/24; the patient saw Dr. early I reviewed his note. sHe noted the previous right femoral endarterectomy with a Dacron patch. He also noted the normal ABI and the monophasic waveforms suggesting tibial disease. Overall he did not feel that she had any evidence of arterial insufficiency that would impair her wound healing. He did note her venous disease as well. He suggested PRN follow-up. 3/2; I had the last puraply applied today. The original wounds over the mid tibia area are improved where is the area on the right lower leg  is not 3/9; wounds are smaller especially in the right mid tibia perhaps slightly in the right lateral calf. We finished with puraply and went to endoform today 3/23; the patient arrives after 2 weeks. She has been using endoform. I think all of her wounds look slightly better  which includes the area on the right lateral calf just above the right lateral malleolus and the 2 in the right mid tibia which were initially part of the same wound. 4/27; TELEHEALTH visit; the patient was seen for telehealth visit today with her consent in the middle of the worldwide epidemic. Since she was last here she called in for antibiotics with pain and tenderness around the area on the right medial ankle. I gave her empiric doxycycline. She states this feels better. She is using endoform on both of these areas 5/11 TELEHEALTH; the patient was seen for telehealth visit today. She was accompanied at home by her husband. She has severe pulmonary hypertension accompanied scleroderma and in the face of the Covid epidemic cannot be safely brought into our clinic. We have been using endoform on her wound areas. There are essentially 3 wound areas now in the left mid tibia now 2 open areas that it one point were connected and one on the right lateral ankle just above the malleolus. 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 She cannot come to clinic. The wounds were reviewed today via telehealth. She has severe chronic venous hypertension which I think is centrally mediated. She has wounds on her right anterior tibia and right lateral ankle area. These are chronic. She has been using endoform. 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 We have been following her from telehealth. She has severe chronic venous hypertension which may be mostly centrally mediated secondary to right heart heart failure. She has wounds on her right anterior tibia and right lateral ankle these are chronic we have been using endoform 6/22; TELEHEALTH; this patient was seen today via telehealth. 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 Since the last time we had contact with this patient she developed some pain and erythema around the wound on her right lateral malleolus/ankle and we put in antibiotics for her. This is resulted in good improvement with resolution of the erythema and tenderness. I changed her to silver alginate last time. We had been using endoform for an extended period of time 7/13; TELEHEALTH; this patient was seen today via telehealth. 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 She would continue to be at a prohibitive risk to be brought into our clinic unless this was absolutely necessary. These visits have been done with her approval as well as her husband. We have been using silver alginate to the areas on the right mid tibia and right lateral lower leg. 7/27 TELEHEALTH; patient was seen along with her husband today via telehealth. 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 We changed her to sample last visit. She has 2 areas a chronic wound on her right mid tibia and one just above her ankle. These were not the original wounds when she came into this clinic but she developed them during treatment 8/17; she comes in for her first face-to-face visit in a long period. She has a remaining area just medial to the right tibia which is the  last open part of her large wound across this area. She also has an area on  the right lateral lower leg. We prescribed Santyl last telehealth visit but they were concerned that this was making a deeper so they put silver alginate on it last week. Her husband changes the dressings. 8/31; using Santyl to the 2 wound areas some improvement in wound surfaces. Husband has surgery in 2 weeks we will put her out 3 weeks. Any of the advanced treatment options that I can think of that would be eligible for this wound would also cause her to have to come in weekly. The risk that the patient is just too high 9/21. Using Santyl to the 2 wound areas. Both of these are somewhat better although the medial mid tibia area still has exposed muscle. Lateral ankle requiring debridement. Using Santyl 10/12; using Santyl to the 2 wound areas. One on the right lateral ankle and the other in the medial calf which still has exposed muscle. Both areas have come down in size and have better looking surfaces. She has made nice progress with santyl 11/2; we have been using Santyl to the 2 wound areas. Right lateral ankle and the other in the mid tibia area the medial part of this still has exposed muscle. 11/23/2019 on evaluation today patient appears to be doing about the same really with regard to her wounds. She is actually not very pleased with how things seem to be progressing at this point she tells me that she really has not noted much improvement unfortunately. With that being said there is no signs of active infection at this time. There is some slough buildup noted at this time which again along with some dry skin around the edges of the wound I think would benefit her to try to debride some of this away. Fortunately her pain is doing fairly well. She still has exposed muscle in the right medial/tibial area. 12/14; TELEHEALTH; she was changed to [facility] to the right calf wound and right lateral ankle wound when she was here last time. Unfortunately since then she had a fall with a pelvic  fracture and a fracture of her wrist. She was apparently hospitalized for 5 days. I have not looked at her discharge summary. She apparently came out of the hospital with a blister on her right heel. She was seen today via telehealth by myself and our case manager. The patient and her husband were present. She has been using Hydrofera Blue at our direction from the last time she was in the clinic. There is been no major improvement in fact the areas appear deeper and with a less viable surface 01/04/2020; TELEHEALTH; the patient was seen today in accompaniment of her husband and our nurse. She has 3 open areas 1 on the right medial mid tibia, one on the right lateral ankle and a large eschared pressure area on the right heel. We have been using Iodoflex to the 2 original wounds. 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 It is simply too perilous for her to be seen in any other way 1/26; TELEHEALTH; the patient was seen today in accompaniment of her husband and our RN one of our nurses. She still has the 3 open areas 1 on the right medial mid tibia which is the remanent of a more extensive wound in this area, one on the right lateral ankle and a large eschared pressure area on the right heel. We have been  using Iodoflex to the 2 original wounds and a bed at 9 application to the eschared area on the heel 2/15; the first time we have seen this patient and then several months out of concern for the pandemic. She had a large horizontal wound in the mid tibia. Only the medial aspect of this is still open. Area on the lateral ankle is just about closed. She had a new pressure ulcer on the right heel which I have removed some of the eschar. We have been using Iodoflex which I will continue. The area in the mid tibia has a round circle in the middle of exposed muscle. I think we would have to use an advanced treatment product to stimulate granulation over this area. We will run  this through her insurance. She is not eligible for plastic surgery for many reasons 3/1; TELEHEALTH. The patient was seen today by telehealth. 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 We have been using Iodoflex to the wound areas which include the right anterior mid tibia, right lateral ankle and the right heel. All of these were reviewed 3/8; the patient's wound just above the right ankle is closed. She still has a contracting black eschar on the heel where she had a pressure ulcer. The medial part of her original wound on the mid tibia has exposed muscle. We have made really made no progress in this area although we have managed to get a lot of the original wound in this area to close. We used Apligraf #1 today 3/22; the patient's ankle wound remains closed. She still has a contracting black eschar on the heel although it seems to have less surface area. It still not clear whether there is any depth here. We used Apligraf #2 today in an attempt to get granulation over the exposed muscle and what is left of her mid tibia area. 4/5; everything is closed except the medial aspect of the mid tibia wound, as well as the pressure injury on the right heel. She has been using Betadine to the right heel which has been gradually contracting. We used Apligraf #3 today. 4/19; Apligraf #4. Still a pressure area on the right heel she has been using Betadine superficial excoriated areas she has been applying salicylic acid to on the anterior leg and the thick horn on the leg just above her wound area 5/3; Apligraf #5. Unfortunately she came in today with a reopening of the area on the right lateral lower leg. Not much change in the wound we have been treating in the mid calf. Quite a bit of edema surrounding this wound. She reports that she was up on her feet quite a bit. The area on the right heel is separating she  has been using Betadine She comes in with a new wound on the left lateral malleolus which is very disappointing this is been open since last week 5/17; we applied her last Apligraf last time. Unfortunately that did not have any effect on the deep area medially in the mid tibial area. However the area over the right lateral malleolus is a lot better. Right heel also is contracted we used Iodoflex last time. The new wound on the left lateral malleolus were using Santyl to. This required debridement. 6/1. Not much change in the area on the right medial mid tibia. Right lateral ankle is improved she has the necrotic area on the right heel which  was a pressure area. New area on the left lateral malleolus from last time. I changed her to Iodoflex to the area on the left lateral malleolus she said this hurt I have put her back on Santyl 6/15; right mid tibia is unchanged. I do not think there is anything I can do to this topically that we will get this to granulate over the muscle. And I am furthermore I am not sure that she is a surgical candidate either because of her severe pulmonary issues or because she really does not want to go through it. ooThe area on the right lateral malleolus is a small punched out area. ooThe area on the left lateral malleolus again small punched out with nonviable surface ooPressure ulcer on the right heel at separating black eschar. I went ahead and remove this today Lura Em has an area on the left anterior mid tibia just laterally. Geographic wounds debris on the surface. Some erythema here. 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 There is skin changes widely Change the primary dressing to Sorbact to all wound areas. She needs compression on the left leg as well as the right 6/21; right mid tibia which was her one remaining wound at 1 point is unchanged ooThe area on the right lateral malleolus actually is close to closing over still a small  punched out area ooThe area on the left lateral malleolus again a deep wound it is this 1 that she feels pain in. ooPressure ulcer on the right heel was cultured today. ooNew area on the left anterior mid tibia several geographic wounds last week in the setting of chronic venous insufficiency this actually looks some better 7/6; ooRight mid tibia about the same. Exposed muscle ooLeft lateral malleolus again necrotic debris over the surface. ooPressure ulcer on the right heel. She finished the Keflex we prescribed. Necrotic debris debrided with a #3 curette ooThe rest of her wounds on the right mid tibia right lateral ankle are better Her husband reminds me that she had a right femoral endarterectomy and has 2 stents in her left thigh placed in 2011 approximately by vascular surgery in [place]. She saw Dr. [name] last in February 2020. At that point he did not think that the arterial insufficiency she had on the right was sufficient to explain any of her right lower extremity wounds however she now has an area on the left lateral malleolus that looks like an ischemic wound 7/12; we have been using Sorbact all wound areas ooRight mid tibia about the same exposed muscle ooLeft lateral malleolus small wound with debris in the surface punched out ooPressure ulcer of the right heel requiring debridement of necrotic debris ooLateral ankle wound on the right is just about closed ooRight mid tibia wound about the same still with exposed muscle. 7/26 really no change in any wounds. We have been using Sorbact. She has bilateral lower extremity wounds. This is in the setting of scleroderma, severe venous hypertension. She also has known PAD and I had really hoped to be able to get a review by Dr. [name] but apparently that is not booked until August 31. I have asked her to call back to vein and vascular and get an appointment with somebody a little earlier if that is possible. 8/16; patient's  area on the right ankle which was a chronic wound is closed over again. The area on the right mid lower leg, right heel left lateral ankle are all about the same. Pressure ulcer  on the right heel is still not viable. New areas identified on the right lateral calf. She has a follow-up with Dr. [name] on 8/31. Phenomenally I would like him to go over her arterial status with regards to her underlying stents. 9/13; patient had her ARTERIAL studies done however apparently Dr. [name] is now in [employer] so she did not get an appointment with him on 8/31 and was not referred to any other provider. On the right side her PTA was monophasic. Noncompressible. TBI on the right at 0.41 on the left she was monophasic and noncompressible they did not do a TBI because she had a Band-Aid on her big toe. Patient does not describe claudication although she is having a lot of pain in the left lateral malleolus. 10/11; the patient had a right-sided interventions on 10/5. She had a angioplasty of the right anterior tibial artery as well as the tibioperoneal trunk/peroneal artery she had a drug-coated balloon angioplasty of the right superficial femoral artery. On the left she did not have any interventions yet but is going to have another look at this in 2 weeks. The superficial femoral artery on the left and the associated stent are patent popliteal artery is patent to the dominant vessel runoff is the anterior tibial which has several high-grade stenosis in the proximal one third. We have been using Sorbact. She also has a new area on the right upper mid tibia. 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 She tolerated this well. She is using Iodoflex and her husband is doing  a kerlix Coban on her. She arrives in clinic today with the original medial part of her mid tibia wound with muscle exposure small area on the right ankle which was part of her original wound she has 2 punched-out areas laterally and 1 above her area anteriorly. Nonviable surfaces similarly the right heel is nonviable. On the left side she has an area on the left foot and the left lateral ankle similar nonviable surfaces. Objective Constitutional Patient is hypertensive.. Pulse regular and within target range for patient.Marland Kitchen Respirations regular, non-labored and within target range.. Temperature is normal and within the target range for the patient.Marland Kitchen Appears in no distress. Vitals Time Taken: 1:26 PM, Height: 68 in, Weight: 132 lbs, BMI: 20.1, Temperature: 98.6 F, Pulse: 72 bpm, Respiratory Rate: 18 breaths/min, Blood Pressure: 161/64 mmHg. Cardiovascular She has dorsalis pedis pulses bilaterally left greater than right. Significant swelling of the right calf which is pitting. General Notes: Wound exam ooMidportion of the right mid tibia part of her original wound this is not really changed. On the right lateral she has 2 punched-out areas in the third punched-out area on the right anterior. Although these covered in nonviable surface material. ooOn the right lateral ankle part of her original wound although this is not that ominous. ooRight medial heel which was initially termed a pressure ulcer also would require debridement but I did not do any of this today ooLeft lateral foot and left lateral ankle very similar presentations to the wounds on the right Integumentary (Hair, Skin) Wound #16 status is Open. Original cause of wound was Pressure Injury. The wound is located on the Right Calcaneus. The wound measures 1.6cm length x 1cm width x 0.9cm depth; 1.257cm^2 area  and 1.131cm^3 volume. There is Fat Layer (Subcutaneous Tissue) exposed. There is no tunneling or undermining noted. There is a  medium amount of serosanguineous drainage noted. The wound margin is flat and intact. There is small (1-33%) pink granulation within the wound bed. There is a large (67-100%) amount of necrotic tissue within the wound bed including Adherent Slough. Wound #19 status is Open. Original cause of wound was Gradually Appeared. The wound is located on the Right,Lateral Malleolus. The wound measures 0.5cm length x 0.4cm width x 0.1cm depth; 0.157cm^2 area and 0.016cm^3 volume. There is Fat Layer (Subcutaneous Tissue) exposed. There is no tunneling or undermining noted. There is a small amount of serous drainage noted. The wound margin is flat and intact. There is no granulation within the wound bed. There is a large (67-100%) amount of necrotic tissue within the wound bed including Adherent Slough. Wound #20 status is Open. Original cause of wound was Gradually Appeared. The wound is located on the Left,Lateral Malleolus. The wound measures 1cm length x 0.8cm width x 0.3cm depth; 0.628cm^2 area and 0.188cm^3 volume. There is Fat Layer (Subcutaneous Tissue) exposed. There is no tunneling or undermining noted. There is a medium amount of serosanguineous drainage noted. The wound margin is well defined and not attached to the wound base. There is no granulation within the wound bed. There is a large (67-100%) amount of necrotic tissue within the wound bed including Adherent Slough. Wound #23 status is Open. Original cause of wound was Gradually Appeared. The wound is located on the Right,Posterior Lower Leg. The wound measures 2cm length x 2.7cm width x 0.2cm depth; 4.241cm^2 area and 0.848cm^3 volume. There is Fat Layer (Subcutaneous Tissue) exposed. There is no tunneling or undermining noted. There is a small amount of purulent drainage noted. The wound margin is distinct with the outline attached to the wound base. There is no granulation within the wound bed. There is a large (67-100%) amount of necrotic  tissue within the wound bed including Adherent Slough. Wound #24 status is Open. Original cause of wound was Gradually Appeared. The wound is located on the Right,Anterior Lower Leg. The wound measures 1.6cm length x 1.1cm width x 0.4cm depth; 1.382cm^2 area and 0.553cm^3 volume. There is no tunneling or undermining noted. There is a medium amount of serosanguineous drainage noted. The wound margin is distinct with the outline attached to the wound base. There is no granulation within the wound bed. There is a large (67-100%) amount of necrotic tissue within the wound bed including Adherent Slough. Wound #25 status is Open. Original cause of wound was Gradually Appeared. The wound is located on the Left,Lateral Foot. The wound measures 0.6cm length x 0.6cm width x 0.2cm depth; 0.283cm^2 area and 0.057cm^3 volume. There is no tunneling or undermining noted. There is a medium amount of purulent drainage noted. The wound margin is distinct with the outline attached to the wound base. There is no granulation within the wound bed. There is a large (67- 100%) amount of necrotic tissue within the wound bed including Adherent Slough. Wound #26 status is Open. Original cause of wound was Gradually Appeared. The wound is located on the Right,Lateral Lower Leg. The wound measures 0.9cm length x 0.7cm width x 0.3cm depth; 0.495cm^2 area and 0.148cm^3 volume. There is Fat Layer (Subcutaneous Tissue) exposed. There is no tunneling or undermining noted. There is a medium amount of serosanguineous drainage noted. There is no granulation within the wound bed. There is a large (67-100%) amount of necrotic  tissue within the wound bed including Adherent Slough. Wound #5 status is Open. Original cause of wound was Gradually Appeared. The wound is located on the Right,Medial Lower Leg. The wound measures 2cm length x 2.1cm width x 0.4cm depth; 3.299cm^2 area and 1.319cm^3 volume. There is Fat Layer (Subcutaneous Tissue)  exposed. There is no tunneling or undermining noted. There is a medium amount of serosanguineous drainage noted. The wound margin is distinct with the outline attached to the wound base. There is small (1-33%) red, pink granulation within the wound bed. There is a large (67-100%) amount of necrotic tissue within the wound bed including Adherent Slough. 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 There was a Three Layer Compression Therapy Procedure by Levan Hurst, RN. Post procedure Diagnosis Wound #16: Same as Pre-Procedure Wound #19 Pre-procedure diagnosis of Wound #19 is a Venous Leg Ulcer located on the Right,Lateral Malleolus . There was a Three Layer Compression Therapy Procedure by Levan Hurst, RN. Post procedure Diagnosis Wound #19: Same as Pre-Procedure Wound #23 Pre-procedure diagnosis of Wound #23 is a Venous Leg Ulcer located on the Right,Posterior Lower Leg . There was a Three Layer Compression Therapy Procedure by Levan Hurst, RN. Post procedure Diagnosis Wound #23: Same as Pre-Procedure Wound #24 Pre-procedure diagnosis of Wound #24 is a Venous Leg Ulcer located on the Right,Anterior Lower Leg . There was a Three Layer Compression  Therapy Procedure by Levan Hurst, RN. 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 There was a Three Layer Compression Therapy Procedure by Levan Hurst, RN. Post procedure Diagnosis Wound #26: Same as Pre-Procedure Wound #5 Pre-procedure diagnosis of Wound #5 is a Venous Leg Ulcer located on the Right,Medial Lower Leg . There was a Three Layer Compression Therapy Procedure by Levan Hurst, RN. Post procedure Diagnosis Wound #5: Same as Pre-Procedure Plan Follow-up Appointments: Return Appointment in 1 week. Dressing Change Frequency: Change Dressing every other day. - left heel and left foot wound Do not change entire dressing for one week. - wounds on right leg and heel Skin Barriers/Peri-Wound Care: Moisturizing lotion - both legs Wound Cleansing: May shower with protection. Primary Wound Dressing: Wound #16 Right Calcaneus: Iodoflex Wound #19 Right,Lateral Malleolus: Iodoflex Wound #20 Left,Lateral Malleolus: Iodoflex Wound #23 Right,Posterior Lower Leg: Iodoflex Wound #24 Right,Anterior Lower Leg: Iodoflex Wound #25 Left,Lateral Foot: Iodoflex Wound #26 Right,Lateral Lower Leg: Iodoflex Wound #5 Right,Medial Lower Leg: Iodoflex Secondary Dressing: Dry Gauze - all wounds Edema Control: Kerlix and Coban - Left Lower Extremity 3 Layer Compression System - Right Lower Extremity Avoid standing for long periods of time Elevate legs to the level of the heart or above for 30 minutes daily and/or when sitting, a frequency of: - throughout the day Off-Loading: Turn and  reposition every 2 hours Other: - float heels off of bed/chair with pillow under calves #1 I continued with Iodoflex 2. After some consideration I wanted to put her right leg in 3 layer compression and leave her left and 2. This is because of very significant venous looking edema  in the right leg. I am doubtful this represents a DVT certainly not cellulitis. 3. I have asked her to take this off if the pain level increases especially on the right leg. 4. Almost all of her wounds currently require debridement although I did not do this today. 5. She has pedal pulses bilaterally left greater than right I do not think she has sufficient ischemia to prevent 3 layer compression on the right Electronic Signature(s) Signed: 10/31/2020 5:37:06 PM By: Linton Ham MD Entered By: Linton Ham on 10/31/2020 14:01:07 -------------------------------------------------------------------------------- SuperBill Details Patient Name: Date of Service: [name], Yolanda Manges. 10/31/2020 Medical Record Number: [id number] Patient Account Number: [id number] Date of Birth/Sex: Treating RN: 15-Sep-1951 (69 y.o. 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 I87.331 Chronic venous hypertension (idiopathic) with ulcer and inflammation of right lower extremity L89.616 Pressure-induced deep tissue damage of right heel L97.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 I70.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 I70.248 Atherosclerosis of native arteries of left leg with ulceration of other part of lower leg Facility Procedures CPT4 Code: [id number] Description: (Facility Use Only) [phone] - APPLY MULTLAY COMPRS LWR RT LEG Modifier: Quantity: 1 Physician Procedures : CPT4 Code  Description Modifier [id number] - WC PHYS LEVEL 3 - EST PT ICD-10 Diagnosis Description L97.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 L97.811 Non-pressure chronic ulcer of other part of right lower leg limited to  breakdown of skin L97.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 Quantity: 1 Electronic Signature(s) Signed: 10/31/2020 5:37:06 PM By: Linton Ham MD Entered By: Linton Ham on 10/31/2020 14:01:34

## 2020-11-02 NOTE — Progress Notes (Signed)
[name], [name] ([id number]) Visit Report for 10/31/2020 Arrival Information Details Patient Name: Date of Service: [name] 10/31/2020 12:30 PM Medical Record Number: [id number] Patient Account Number: [id number] Date of Birth/Sex: Treating RN: 1951/04/22 (69 y.o. Orvan Falconer Primary Care Lynnann Knudsen: [name] Other Clinician: Referring Leira Regino: Treating Aminah Zabawa/Extender: [name] in Treatment: 19 Visit Information History Since Last Visit All ordered tests and consults were completed: No Patient Arrived: Wheel Chair Added or deleted any medications: No Arrival Time: 13:25 Any new allergies or adverse reactions: No Accompanied By: husband Had a fall or experienced change in No Transfer Assistance: None activities of daily living that may affect Patient Identification Verified: Yes risk of falls: Secondary Verification Process Completed: Yes Signs or symptoms of abuse/neglect since last visito No Patient Requires Transmission-Based Precautions: No Hospitalized since last visit: No Patient Has Alerts: Yes Implantable device outside of the clinic excluding No Patient Alerts: R ABI: [place] TBI: 0.41 cellular tissue based products placed in the center L ABI: [place] (07/2020) since last visit: Has Dressing in Place as Prescribed: Yes Has Compression in Place as Prescribed: Yes Pain Present Now: No Electronic Signature(s) Signed: 11/02/2020 9:08:43 AM By: Carlene Coria RN Entered By: Carlene Coria on 10/31/2020 13:26:19 -------------------------------------------------------------------------------- Compression Therapy Details Patient Name: Date of Service: [name] CKYolanda [name] 10/31/2020 12:30 PM Medical Record Number: [id number] Patient Account Number: [id number] Date of Birth/Sex: Treating RN: 1951-07-31 (69 y.o. [name] [name] Primary Care Sacora Hawbaker: [name] Other Clinician: Referring Cailie Bosshart: Treating Jacquetta Polhamus/Extender: [name] in Treatment: 449 Compression Therapy Performed for Wound Assessment: Wound #16 Right Calcaneus Performed By: Clinician Levan Hurst, RN Compression Type: Three Layer Post Procedure Diagnosis Same as Pre-procedure Electronic Signature(s) Signed: 10/31/2020 5:57:05 PM By: Levan Hurst RN, BSN Entered By: Levan Hurst on 10/31/2020 13:51:36 -------------------------------------------------------------------------------- Compression Therapy Details Patient Name: Date of Service: [name] 10/31/2020 12:30 PM Medical Record Number: [id number] Patient Account Number: [id number] Date of Birth/Sex: Treating RN: 03-Dec-1951 (69 y.o. [name] [name] Primary Care Darrick Greenlaw: [name] Other Clinician: Referring Tiyonna Sardinha: Treating Ahmia Colford/Extender: [name] in Treatment: 449 Compression Therapy Performed for Wound Assessment: Wound #19 Right,Lateral Malleolus Performed By: Clinician Levan Hurst, RN Compression Type: Three Layer Post Procedure Diagnosis Same as Pre-procedure Electronic Signature(s) Signed: 10/31/2020 5:57:05 PM By: Levan Hurst RN, BSN Entered By: Levan Hurst on 10/31/2020 13:51:36 -------------------------------------------------------------------------------- Compression Therapy Details Patient Name: Date of Service: [name] 10/31/2020 12:30 PM Medical Record Number: [id number] Patient Account Number: [id number] Date of Birth/Sex: Treating RN: 12-08-1951 (69 y.o. [name] [name] Primary Care Demeshia Sherburne: [name] Other Clinician: Referring Lavance Beazer: Treating Keymani Glynn/Extender: [name] in Treatment: 449 Compression Therapy Performed for Wound Assessment: Wound #23 Right,Posterior Lower Leg Performed By: Clinician Levan Hurst, RN Compression Type: Three Layer Post Procedure Diagnosis Same as Pre-procedure Electronic Signature(s) Signed:  10/31/2020 5:57:05 PM By: Levan Hurst RN, BSN Entered By: Levan Hurst on 10/31/2020 13:51:36 -------------------------------------------------------------------------------- Compression Therapy Details Patient Name: Date of Service: [name] 10/31/2020 12:30 PM Medical Record Number: [id number] Patient Account Number: [id number] Date of Birth/Sex: Treating RN: 1951/07/15 (69 y.o. [name] [name] Primary Care Zack Crager: [name] Other Clinician: Referring Briellah Baik: Treating Alexsandria Kivett/Extender: [name] in Treatment: 449 Compression Therapy Performed for Wound Assessment: Wound #24 Right,Anterior Lower Leg Performed By: Clinician Levan Hurst, RN Compression  Type: Three Layer Post Procedure Diagnosis Same as Pre-procedure Electronic Signature(s) Signed: 10/31/2020 5:57:05 PM By: Levan Hurst RN, BSN Entered By: Levan Hurst on 10/31/2020 13:51:37 -------------------------------------------------------------------------------- Compression Therapy Details Patient Name: Date of Service: [name] 10/31/2020 12:30 PM Medical Record Number: [id number] Patient Account Number: [id number] Date of Birth/Sex: Treating RN: May 24, 1951 (69 y.o. [name] [name] Primary Care [name] [name]: [name] Other Clinician: Referring Judee Hennick: Treating Wilgus Deyton/Extender: [name] in Treatment: 449 Compression Therapy Performed for Wound Assessment: Wound #26 Right,Lateral Lower Leg Performed By: Clinician Levan Hurst, RN Compression Type: Three Layer Post Procedure Diagnosis Same as Pre-procedure Electronic Signature(s) Signed: 10/31/2020 5:57:05 PM By: Levan Hurst RN, BSN Entered By: Levan Hurst on 10/31/2020 13:51:37 -------------------------------------------------------------------------------- Compression Therapy Details Patient Name: Date of Service: [name] 10/31/2020 12:30  PM Medical Record Number: [id number] Patient Account Number: [id number] Date of Birth/Sex: Treating RN: 1951/07/09 (69 y.o. [name] [name] Primary Care Oluwaseyi Raffel: [name] Other Clinician: Referring Allyanna Appleman: Treating Katlynn Naser/Extender: [name] in Treatment: 449 Compression Therapy Performed for Wound Assessment: Wound #5 Right,Medial Lower Leg Performed By: Clinician Levan Hurst, RN Compression Type: Three Layer Post Procedure Diagnosis Same as Pre-procedure Electronic Signature(s) Signed: 10/31/2020 5:57:05 PM By: Levan Hurst RN, BSN Entered By: Levan Hurst on 10/31/2020 13:51:37 -------------------------------------------------------------------------------- Encounter Discharge Information Details Patient Name: Date of Service: [name], [name] 10/31/2020 12:30 PM Medical Record Number: [id number] Patient Account Number: [id number] Date of Birth/Sex: Treating RN: 17-Feb-1951 (69 y.o. [name] Primary Care Havard Radigan: Other Clinician: [name] Referring Jensyn Shave: Treating Ondine Gemme/Extender: [name] in Treatment: [phone] Encounter Discharge Information Items Discharge Condition: Stable Ambulatory Status: Wheelchair Discharge Destination: Home Transportation: Private Auto Accompanied By: husband Schedule Follow-up Appointment: Yes Clinical Summary of Care: Electronic Signature(s) Signed: 10/31/2020 6:07:40 PM By: Deon Pilling Entered By: Deon Pilling on 10/31/2020 15:10:47 -------------------------------------------------------------------------------- Lower Extremity Assessment Details Patient Name: Date of Service: [name] 10/31/2020 12:30 PM Medical Record Number: [id number] Patient Account Number: [id number] Date of Birth/Sex: Treating RN: 05-17-1951 (69 y.o. Orvan Falconer Primary Care Portia Wisdom: [name] Other Clinician: Referring Rana Adorno: Treating Cynthia Cogle/Extender:  [name] in Treatment: 449 Edema Assessment Assessed: [name]: No] [Right: No] Edema: [Left: Yes] [Right: Yes] Calf Left: Right: Point of Measurement: 36 cm From Medial Instep 30 cm 32 cm Ankle Left: Right: Point of Measurement: 10 cm From Medial Instep 21 cm 21 cm Electronic Signature(s) Signed: 11/02/2020 9:08:43 AM By: Carlene Coria RN Entered By: Carlene Coria on 10/31/2020 13:27:03 -------------------------------------------------------------------------------- Multi Wound Chart Details Patient Name: Date of Service: [name] 10/31/2020 12:30 PM Medical Record Number: [id number] Patient Account Number: [id number] Date of Birth/Sex: Treating RN: 1951/11/11 (69 y.o. 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] Primary Etiology: [16:Anemia, Hypertension, Peripheral] [19:Anemia, Hypertension, Peripheral] [20:Anemia, Hypertension, Peripheral] Comorbid History: [16:Arterial Disease, Peripheral Venous Arterial Disease, Peripheral Venous Arterial Disease, Peripheral Venous Disease, Raynauds, Scleroderma, Rheumatoid Arthritis, Osteoarthritis Rheumatoid Arthritis, Osteoarthritis Rheumatoid  Arthritis, Osteoarthritis  01/04/2020] [19:Disease, Raynauds, Scleroderma, Disease, Raynauds, Scleroderma, 05/02/2020] [20:05/02/2020] Date Acquired: [16:43] [19:26] [20:26] Weeks of  Treatment: [16:Open] [19:Open] [20:Open] Wound Status: [16:No] [19:No] [20:No] Clustered Wound: [16:1.6x1x0.9] [19:0.5x0.4x0.1] [20:1x0.8x0.3] Measurements L x W x D (cm) [16:1.257] [19:0.157] [20:0.628] A (cm) : rea [16:1.131] [19:0.016] [20:0.188] Volume (cm) : [16:90.00%] [19:-11.30%] [20:-42.70%] % Reduction in Area: [16:10.00%] [19:42.90%] [20:-113.60%] % Reduction in Volume: [16:Category/Stage III] [19:Full Thickness Without Exposed] [20:Full Thickness Without Exposed] Classification: [16:Medium] [19:Support Structures Small] [20:Support Structures Medium] Exudate Amount: [16:Serosanguineous] [19:Serous] [20:Serosanguineous] Exudate Type: [16:red, brown] [19:amber] [20:red, brown] Exudate Color: [16:Flat and Intact] [19:Flat and Intact] [20:Well defined, not attached] Wound Margin: [16:Small (1-33%)] [19:None Present (0%)] [20:None Present (0%)] Granulation Amount: [16:Pink] [19:N/A] [20:N/A] Granulation Quality: [16:Large (67-100%)] [19:Large (67-100%)] [20:Large (67-100%)] Necrotic Amount: [16:Fat Layer (Subcutaneous Tissue): Yes Fat Layer (Subcutaneous Tissue): Yes Fat Layer (Subcutaneous Tissue): Yes] Exposed Structures: [16:Fascia: No Tendon: No Muscle: No Joint: No Bone: No None] [19:Fascia: No Tendon: No Muscle: No Joint: No Bone: No None] [20:Fascia: No Tendon: No Muscle: No Joint: No Bone: No None] Epithelialization: [16:Compression Therapy] [19:Compression Therapy] [20:N/A] Wound Number: _0 Photos: No Photos No Photos No Photos Right, Posterior Lower Leg Right, Anterior Lower Leg Left, Lateral Foot Wound Location: Gradually Appeared Gradually Appeared Gradually Appeared 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 Primary Etiology: Anemia, Hypertension, Peripheral Anemia, Hypertension, Peripheral Anemia, Hypertension, Peripheral Comorbid History: Arterial Disease, Peripheral Venous Arterial Disease, Peripheral Venous Arterial Disease,  Peripheral Venous Disease, Raynauds, Scleroderma, Disease, Raynauds, Scleroderma, Disease, Raynauds, Scleroderma, Rheumatoid Arthritis, Osteoarthritis Rheumatoid Arthritis, Osteoarthritis Rheumatoid Arthritis, Osteoarthritis 08/15/2020 10/10/2020 10/10/2020 Date Acquired: _1 Weeks of Treatment: Open Open Open Wound Status: No No No Clustered Wound: 2x2.7x0.2 1.6x1.1x0.4 0.6x0.6x0.2 Measurements L x W x D (cm) 4.241 1.382 0.283 A (cm) : rea 0.848 0.553 0.057 Volume (cm) : -277.60% -14.20% 24.90% % Reduction in Area: -657.10% -52.30% -50.00% % Reduction in Volume: Unclassifiable Full Thickness Without Exposed Unclassifiable Classification: Support Structures Small Medium Medium Exudate Amount: Purulent Serosanguineous Purulent Exudate Type: yellow, brown, green red, brown yellow, brown, green Exudate Color: Distinct, outline attached Distinct, outline attached Distinct, outline attached Wound Margin: None Present (0%) None Present (0%) None Present (0%) Granulation Amount: N/A N/A N/A Granulation Quality: Large (67-100%) Large (67-100%) Large (67-100%) Necrotic Amount: Fat Layer (Subcutaneous Tissue): Yes Fascia: No Fascia: No Exposed Structures: Fascia: No Fat Layer (Subcutaneous Tissue): No Fat Layer (Subcutaneous Tissue): No Tendon: No Tendon: No Tendon: No Muscle: No Muscle: No Muscle: No Joint: No Joint: No Joint: No Bone: No Bone: No Bone: No Large (67-100%) None None Epithelialization: Compression Therapy Compression Therapy N/A Procedures Performed: Wound Number: 26 5 N/A Photos: No Photos No Photos N/A Right Lower Leg Right, Medial Lower Leg N/A Wound Location: Gradually Appeared Gradually Appeared N/A Wounding Event: Venous Leg Ulcer Venous Leg Ulcer N/A Primary Etiology: Anemia, Hypertension, Peripheral Anemia, Hypertension, Peripheral N/A Comorbid History: Arterial Disease, Peripheral Venous Arterial Disease, Peripheral  Venous Disease, Raynauds, Scleroderma, Disease, Raynauds, Scleroderma, Rheumatoid Arthritis, Osteoarthritis Rheumatoid Arthritis, Osteoarthritis 10/27/2020 01/13/2013 N/A Date Acquired: 0 405 N/A Weeks of Treatment: Open Open N/A Wound Status: No Yes N/A Clustered Wound: 0.9x0.7x0.3 2x2.1x0.4 N/A Measurements L x W x D (cm) 0.495 3.299 N/A A (cm) : rea 0.148 1.319 N/A Volume (cm) : N/A -133.30% N/A % Reduction in Area: N/A -835.50% N/A % Reduction in Volume: Full Thickness Without Exposed Full Thickness With Exposed Support N/A Classification: Support Structures Structures Medium Medium N/A Exudate Amount: Serosanguineous  Serosanguineous N/A Exudate Type: red, brown red, brown N/A Exudate Color: N/A Distinct, outline attached N/A Wound Margin: None Present (0%) Small (1-33%) N/A Granulation Amount: N/A Red, Pink N/A Granulation Quality: Large (67-100%) Large (67-100%) N/A Necrotic Amount: Fat Layer (Subcutaneous Tissue): Yes Fat Layer (Subcutaneous Tissue): Yes N/A Exposed Structures: Fascia: No Fascia: No Tendon: No Tendon: No Muscle: No Muscle: No Joint: No Joint: No Bone: No Bone: No None Small (1-33%) N/A Epithelialization: Compression Therapy Compression Therapy N/A Procedures Performed: Treatment Notes Electronic Signature(s) Signed: 10/31/2020 5:37:06 PM By: Linton Ham MD Signed: 10/31/2020 5:57:05 PM By: Levan Hurst RN, BSN Entered By: Linton Ham on 10/31/2020 13:54:33 -------------------------------------------------------------------------------- Multi-Disciplinary Care Plan Details Patient Name: Date of Service: [name], [name] 10/31/2020 12:30 PM Medical Record Number: [id number] Patient Account Number: [id number] Date of Birth/Sex: Treating RN: [birth year] (69 y.o. [name] [name] Primary Care Velita Quirk: [name] Other Clinician: Referring Casi Westerfeld: Treating Jimmye Wisnieski/Extender: [name]  in Treatment: [phone] Active Inactive Wound/Skin Impairment Nursing Diagnoses: Impaired tissue integrity Knowledge deficit related to ulceration/compromised skin integrity Goals: Patient/caregiver will verbalize understanding of skin care regimen Date Initiated: 10/11/2016 Target Resolution Date: 11/11/2020 Goal Status: Active Interventions: Assess patient/caregiver ability to obtain necessary supplies Assess patient/caregiver ability to perform ulcer/skin care regimen upon admission and as needed Assess ulceration(s) every visit Provide education on ulcer and skin care Treatment Activities: Skin care regimen initiated : 10/11/2016 Topical wound management initiated : 10/11/2016 Notes: Electronic Signature(s) Signed: 10/31/2020 5:57:05 PM By: Levan Hurst RN, BSN Entered By: Levan Hurst on 10/31/2020 13:44:51 -------------------------------------------------------------------------------- Pain Assessment Details Patient Name: Date of Service: [name], [name] 10/31/2020 12:30 PM Medical Record Number: [id number] Patient Account Number: [id number] Date of Birth/Sex: Treating RN: 17-Mar-1951 (69 y.o. Orvan Falconer Primary Care Sokha Craker: [name] Other Clinician: Referring Kyndal Gloster: Treating Aftyn Nott/Extender: [name] in Treatment: [phone] Active Problems Location of Pain Severity and Description of Pain Patient Has Paino No Site Locations Pain Management and Medication Current Pain Management: Electronic Signature(s) Signed: 11/02/2020 9:08:43 AM By: Carlene Coria RN Entered By: Carlene Coria on 10/31/2020 13:26:57 -------------------------------------------------------------------------------- Patient/Caregiver Education Details Patient Name: Date of Service: [name] 11/1/2021andnbsp12:30 PM Medical Record Number: [id number] Patient Account Number: [id number] Date of Birth/Gender: Treating RN: [birth year] (69 y.o. [name] [name] Primary Care Physician: [name] Other Clinician: Referring Physician: Treating Physician/Extender: [name] in Treatment: [phone] Education Assessment Education Provided To: Patient Education Topics Provided Wound/Skin Impairment: Methods: Explain/Verbal Responses: State content correctly [employer]) Signed: 10/31/2020 5:57:05 PM By: Levan Hurst RN, BSN Entered By: Levan Hurst on 10/31/2020 13:45:10 -------------------------------------------------------------------------------- Wound Assessment Details Patient Name: Date of Service: [name], [name] 10/31/2020 12:30 PM Medical Record Number: [id number] Patient Account Number: [id number] Date of Birth/Sex: Treating RN: 10-15-1951 (69 y.o. Orvan Falconer Primary Care Cassia Fein: [name] Other Clinician: Referring Marlee Armenteros: Treating Oluwadarasimi Redmon/Extender: [name] in Treatment: 449 Wound Status Wound Number: 16 Primary Pressure Ulcer Etiology: Wound Location: Right Calcaneus Wound Open Wounding Event: Pressure Injury Status: Date Acquired: 01/04/2020 Comorbid Anemia, Hypertension, Peripheral Arterial Disease, Peripheral Weeks Of Treatment: 43 History: Venous Disease, Raynauds, Scleroderma, Rheumatoid Arthritis, Clustered Wound: No Osteoarthritis Wound Measurements Length: (cm) 1.6 Width: (cm) 1 Depth: (cm) 0.9 Area: (cm) 1.257 Volume: (cm) 1.131 % Reduction in Area: 90% % Reduction in Volume: 10% Epithelialization: None Tunneling: No Undermining: No Wound Description Classification: Category/Stage III  Wound Margin: Flat and Intact Exudate Amount: Medium Exudate Type: Serosanguineous Exudate Color: red, brown Foul Odor After Cleansing: No Slough/Fibrino Yes Wound Bed Granulation Amount: Small (1-33%) Exposed Structure Granulation Quality: Pink Fascia Exposed: No Necrotic Amount: Large (67-100%) Fat Layer (Subcutaneous  Tissue) Exposed: Yes Necrotic Quality: Adherent Slough Tendon Exposed: No Muscle Exposed: No Joint Exposed: No Bone Exposed: No Treatment Notes Wound #16 (Right Calcaneus) 1. Cleanse With Wound Cleanser Soap and water 2. Periwound Care Moisturizing lotion 3. Primary Dressing Applied Iodoflex 4. Secondary Dressing Dry Gauze 6. Support Layer Applied 3 layer compression wrap Electronic Signature(s) Signed: 11/02/2020 9:08:43 AM By: Carlene Coria RN Entered By: Carlene Coria on 10/31/2020 13:29:53 -------------------------------------------------------------------------------- Wound Assessment Details Patient Name: Date of Service: [name] 10/31/2020 12:30 PM Medical Record Number: [id number] Patient Account Number: [id number] Date of Birth/Sex: Treating RN: May 30, 1951 (69 y.o. Orvan Falconer Primary Care Grey Schlauch: [name] Other Clinician: Referring Shawnia Vizcarrondo: Treating Lillie Bollig/Extender: [name] in Treatment: 449 Wound Status Wound Number: 19 Primary Venous Leg Ulcer Etiology: Wound Location: Right, Lateral Malleolus Wound Open Wounding Event: Gradually Appeared Status: Date Acquired: 05/02/2020 Comorbid Anemia, Hypertension, Peripheral Arterial Disease, Peripheral Weeks Of Treatment: 26 History: Venous Disease, Raynauds, Scleroderma, Rheumatoid Arthritis, Clustered Wound: No Osteoarthritis Wound Measurements Length: (cm) 0.5 Width: (cm) 0.4 Depth: (cm) 0.1 Area: (cm) 0.157 Volume: (cm) 0.016 % Reduction in Area: -11.3% % Reduction in Volume: 42.9% Epithelialization: None Tunneling: No Undermining: No Wound Description Classification: Full Thickness Without Exposed Support Structures Wound Margin: Flat and Intact Exudate Amount: Small Exudate Type: Serous Exudate Color: amber Foul Odor After Cleansing: No Slough/Fibrino Yes Wound Bed Granulation Amount: None Present (0%) Exposed Structure Necrotic Amount: Large  (67-100%) Fascia Exposed: No Necrotic Quality: Adherent Slough Fat Layer (Subcutaneous Tissue) Exposed: Yes Tendon Exposed: No Muscle Exposed: No Joint Exposed: No Bone Exposed: No Treatment Notes Wound #19 (Right, Lateral Malleolus) 1. Cleanse With Wound Cleanser Soap and water 2. Periwound Care Moisturizing lotion 3. Primary Dressing Applied Iodoflex 4. Secondary Dressing Dry Gauze 6. Support Layer Applied 3 layer compression wrap Electronic Signature(s) Signed: 11/02/2020 9:08:43 AM By: Carlene Coria RN Entered By: Carlene Coria on 10/31/2020 13:30:36 -------------------------------------------------------------------------------- Wound Assessment Details Patient Name: Date of Service: [name] 10/31/2020 12:30 PM Medical Record Number: [id number] Patient Account Number: [id number] Date of Birth/Sex: Treating RN: 1951-03-27 (69 y.o. Orvan Falconer Primary Care Jakeline Dave: [name] Other Clinician: Referring Linah Klapper: Treating Shalisa Mcquade/Extender: [name] in Treatment: [place] Wound Status Wound Number: 20 Primary Arterial Insufficiency Ulcer Etiology: Wound Location: Left, Lateral Malleolus Wound Open Wounding Event: Gradually Appeared Status: Date Acquired: 05/02/2020 Comorbid Anemia, Hypertension, Peripheral Arterial Disease, Peripheral Weeks Of Treatment: 26 History: Venous Disease, Raynauds, Scleroderma, Rheumatoid Arthritis, Clustered Wound: No Osteoarthritis Wound Measurements Length: (cm) 1 Width: (cm) 0.8 Depth: (cm) 0.3 Area: (cm) 0.628 Volume: (cm) 0.188 % Reduction in Area: -42.7% % Reduction in Volume: -113.6% Epithelialization: None Tunneling: No Undermining: No Wound Description Classification: Full Thickness Without Exposed Support Structures Wound Margin: Well defined, not attached Exudate Amount: Medium Exudate Type: Serosanguineous Exudate Color: red, brown Foul Odor After Cleansing:  No Slough/Fibrino Yes Wound Bed Granulation Amount: None Present (0%) Exposed Structure Necrotic Amount: Large (67-100%) Fascia Exposed: No Necrotic Quality: Adherent Slough Fat Layer (Subcutaneous Tissue) Exposed: Yes Tendon Exposed: No Muscle Exposed: No Joint Exposed: No Bone Exposed: No Treatment Notes Wound #20 (Left, Lateral Malleolus) 1. Cleanse With Wound Cleanser Soap and  water 2. Periwound Care Moisturizing lotion 3. Primary Dressing Applied Iodoflex 4. Secondary Dressing Dry Gauze 6. Support Layer [employer] representative) Signed: 11/02/2020 9:08:43 AM By: Carlene Coria RN Signed: 11/02/2020 9:08:43 AM By: Carlene Coria RN Entered By: Carlene Coria on 10/31/2020 13:31:00 -------------------------------------------------------------------------------- Wound Assessment Details Patient Name: Date of Service: [name], [name] 10/31/2020 12:30 PM Medical Record Number: [id number] Patient Account Number: [id number] Date of Birth/Sex: Treating RN: 02-02-1951 (69 y.o. Orvan Falconer Primary Care Madison Albea: [name] Other Clinician: Referring Deepa Barthel: Treating Shyvonne Chastang/Extender: [name] in Treatment: 449 Wound Status Wound Number: 23 Primary Venous Leg Ulcer Etiology: Wound Location: Right, Posterior Lower Leg Wound Open Wounding Event: Gradually Appeared Status: Date Acquired: 08/15/2020 Comorbid Anemia, Hypertension, Peripheral Arterial Disease, Peripheral Weeks Of Treatment: 11 History: Venous Disease, Raynauds, Scleroderma, Rheumatoid Arthritis, Clustered Wound: No Osteoarthritis Wound Measurements Length: (cm) 2 Width: (cm) 2.7 Depth: (cm) 0.2 Area: (cm) 4.241 Volume: (cm) 0.848 % Reduction in Area: -277.6% % Reduction in Volume: -657.1% Epithelialization: Large (67-100%) Tunneling: No Undermining: No Wound Description Classification: Unclassifiable Wound Margin: Distinct, outline  attached Exudate Amount: Small Exudate Type: Purulent Exudate Color: yellow, brown, green Foul Odor After Cleansing: No Slough/Fibrino Yes Wound Bed Granulation Amount: None Present (0%) Exposed Structure Necrotic Amount: Large (67-100%) Fascia Exposed: No Necrotic Quality: Adherent Slough Fat Layer (Subcutaneous Tissue) Exposed: Yes Tendon Exposed: No Muscle Exposed: No Joint Exposed: No Bone Exposed: No Treatment Notes Wound #23 (Right, Posterior Lower Leg) 1. Cleanse With Wound Cleanser Soap and water 2. Periwound Care Moisturizing lotion 3. Primary Dressing Applied Iodoflex 4. Secondary Dressing Dry Gauze 6. Support Layer Applied 3 layer compression wrap Electronic Signature(s) Signed: 11/02/2020 9:08:43 AM By: Carlene Coria RN Entered By: Carlene Coria on 10/31/2020 13:31:29 -------------------------------------------------------------------------------- Wound Assessment Details Patient Name: Date of Service: [name] 10/31/2020 12:30 PM Medical Record Number: [id number] Patient Account Number: [id number] Date of Birth/Sex: Treating RN: [birth year] (69 y.o. Orvan Falconer Primary Care [name] [name]: [name] Other Clinician: Referring Tomaz Janis: Treating Khylah Kendra/Extender: [name] in Treatment: 449 Wound Status Wound Number: 24 Primary Venous Leg Ulcer Etiology: Wound Location: Right, Anterior Lower Leg Wound Open Wounding Event: Gradually Appeared Status: Date Acquired: 10/10/2020 Comorbid Anemia, Hypertension, Peripheral Arterial Disease, Peripheral Weeks Of Treatment: 3 History: Venous Disease, Raynauds, Scleroderma, Rheumatoid Arthritis, Clustered Wound: No Osteoarthritis Wound Measurements Length: (cm) 1.6 Width: (cm) 1.1 Depth: (cm) 0.4 Area: (cm) 1.382 Volume: (cm) 0.553 % Reduction in Area: -14.2% % Reduction in Volume: -52.3% Epithelialization: None Tunneling: No Undermining: No Wound  Description Classification: Full Thickness Without Exposed Support Structures Wound Margin: Distinct, outline attached Exudate Amount: Medium Exudate Type: Serosanguineous Exudate Color: red, brown Foul Odor After Cleansing: No Slough/Fibrino Yes Wound Bed Granulation Amount: None Present (0%) Exposed Structure Necrotic Amount: Large (67-100%) Fascia Exposed: No Necrotic Quality: Adherent Slough Fat Layer (Subcutaneous Tissue) Exposed: No Tendon Exposed: No Muscle Exposed: No Joint Exposed: No Bone Exposed: No Treatment Notes Wound #24 (Right, Anterior Lower Leg) 1. Cleanse With Wound Cleanser Soap and water 2. Periwound Care Moisturizing lotion 3. Primary Dressing Applied Iodoflex 4. Secondary Dressing Dry Gauze 6. Support Layer Applied 3 layer compression wrap Electronic Signature(s) Signed: 11/02/2020 9:08:43 AM By: Carlene Coria RN Entered By: Carlene Coria on 10/31/2020 13:31:48 -------------------------------------------------------------------------------- Wound Assessment Details Patient Name: Date of Service: [name] 10/31/2020 12:30 PM Medical Record Number: [id number] Patient Account Number: [id number] Date of  Birth/Sex: Treating RN: 1951-09-01 (69 y.o. Orvan Falconer Primary Care Javana Schey: [name] Other Clinician: Referring Diane Hanel: Treating [name] [name]/Extender: [name] in Treatment: 449 Wound Status Wound Number: 25 Primary Venous Leg Ulcer Etiology: Wound Location: Left, Lateral Foot Wound Open Wounding Event: Gradually Appeared Status: Date Acquired: 10/10/2020 Comorbid Anemia, Hypertension, Peripheral Arterial Disease, Peripheral Weeks Of Treatment: 3 History: Venous Disease, Raynauds, Scleroderma, Rheumatoid Arthritis, Clustered Wound: No Osteoarthritis Wound Measurements Length: (cm) 0.6 Width: (cm) 0.6 Depth: (cm) 0.2 Area: (cm) 0.283 Volume: (cm) 0.057 % Reduction in Area: 24.9% %  Reduction in Volume: -50% Epithelialization: None Tunneling: No Undermining: No Wound Description Classification: Unclassifiable Wound Margin: Distinct, outline attached Exudate Amount: Medium Exudate Type: Purulent Exudate Color: yellow, brown, green Foul Odor After Cleansing: No Slough/Fibrino Yes Wound Bed Granulation Amount: None Present (0%) Exposed Structure Necrotic Amount: Large (67-100%) Fascia Exposed: No Necrotic Quality: Adherent Slough Fat Layer (Subcutaneous Tissue) Exposed: No Tendon Exposed: No Muscle Exposed: No Joint Exposed: No Bone Exposed: No Treatment Notes Wound #25 (Left, Lateral Foot) 1. Cleanse With Wound Cleanser Soap and water 2. Periwound Care Moisturizing lotion 3. Primary Dressing Applied Iodoflex 4. Secondary Dressing Dry Gauze 6. Support Layer Holiday representative) Signed: 11/02/2020 9:08:43 AM By: Carlene Coria RN Entered By: Carlene Coria on 10/31/2020 13:32:14 -------------------------------------------------------------------------------- Wound Assessment Details Patient Name: Date of Service: [name] 10/31/2020 12:30 PM Medical Record Number: [id number] Patient Account Number: [id number] Date of Birth/Sex: Treating RN: Sep 18, 1951 (69 y.o. Orvan Falconer Primary Care Tilak Oakley: [name] Other Clinician: Referring Ellis Koffler: Treating Latavion Halls/Extender: [name] in Treatment: Viola Wound Status Wound Number: 26 Primary Venous Leg Ulcer Etiology: Wound Location: Right Lower Leg Wound Open Wounding Event: Gradually Appeared Status: Date Acquired: 10/27/2020 Comorbid Anemia, Hypertension, Peripheral Arterial Disease, Peripheral Weeks Of Treatment: 0 History: Venous Disease, Raynauds, Scleroderma, Rheumatoid Arthritis, Clustered Wound: No Osteoarthritis Wound Measurements Length: (cm) 0.9 Width: (cm) 0.7 Depth: (cm) 0.3 Area: (cm) 0.495 Volume: (cm) 0.148 %  Reduction in Area: % Reduction in Volume: Epithelialization: None Tunneling: No Undermining: No Wound Description Classification: Full Thickness Without Exposed Support Structures Exudate Amount: Medium Exudate Type: Serosanguineous Exudate Color: red, brown Foul Odor After Cleansing: No Slough/Fibrino Yes Wound Bed Granulation Amount: None Present (0%) Exposed Structure Necrotic Amount: Large (67-100%) Fascia Exposed: No Necrotic Quality: Adherent Slough Fat Layer (Subcutaneous Tissue) Exposed: Yes Tendon Exposed: No Muscle Exposed: No Joint Exposed: No Bone Exposed: No Electronic Signature(s) Signed: 11/02/2020 9:08:43 AM By: Carlene Coria RN Entered By: Carlene Coria on 10/31/2020 13:35:58 -------------------------------------------------------------------------------- Wound Assessment Details Patient Name: Date of Service: [name] 10/31/2020 12:30 PM Medical Record Number: [id number] Patient Account Number: [id number] Date of Birth/Sex: Treating RN: 06-16-1951 (69 y.o. Orvan Falconer Primary Care Dom Haverland: [name] Other Clinician: Referring Ocia Simek: Treating Yaritsa Savarino/Extender: [name] in Treatment: 449 Wound Status Wound Number: 5 Primary Venous Leg Ulcer Etiology: Wound Location: Right, Medial Lower Leg Wound Open Wounding Event: Gradually Appeared Status: Date Acquired: 01/13/2013 Comorbid Anemia, Hypertension, Peripheral Arterial Disease, Peripheral Weeks Of Treatment: 405 History: Venous Disease, Raynauds, Scleroderma, Rheumatoid Arthritis, Clustered Wound: Yes Osteoarthritis Wound Measurements Length: (cm) 2 Width: (cm) 2.1 Depth: (cm) 0.4 Area: (cm) 3.299 Volume: (cm) 1.319 % Reduction in Area: -133.3% % Reduction in Volume: -835.5% Epithelialization: Small (1-33%) Tunneling: No Undermining: No Wound Description Classification: Full Thickness With Exposed Support Structures Wound Margin: Distinct,  outline attached Exudate Amount: Medium  Exudate Type: Serosanguineous Exudate Color: red, brown Foul Odor After Cleansing: No Slough/Fibrino Yes Wound Bed Granulation Amount: Small (1-33%) Exposed Structure Granulation Quality: Red, Pink Fascia Exposed: No Necrotic Amount: Large (67-100%) Fat Layer (Subcutaneous Tissue) Exposed: Yes Necrotic Quality: Adherent Slough Tendon Exposed: No Muscle Exposed: No Joint Exposed: No Bone Exposed: No Treatment Notes Wound #5 (Right, Medial Lower Leg) 1. Cleanse With Wound Cleanser Soap and water 2. Periwound Care Moisturizing lotion 3. Primary Dressing Applied Iodoflex 4. Secondary Dressing Dry Gauze 6. Support Layer Applied 3 layer compression wrap Electronic Signature(s) Signed: 11/02/2020 9:08:43 AM By: Carlene Coria RN Entered By: Carlene Coria on 10/31/2020 13:33:17 -------------------------------------------------------------------------------- Vitals Details Patient Name: Date of Service: [name], [name] 10/31/2020 12:30 PM Medical Record Number: [id number] Patient Account Number: [id number] Date of Birth/Sex: Treating RN: November 03, 1951 (69 y.o. Orvan Falconer Primary Care Jadzia Ibsen: [name] Other Clinician: Referring Viveca Beckstrom: Treating Isola Mehlman/Extender: [name] in Treatment: 449 Vital Signs Time Taken: 13:26 Temperature (F): 98.6 Height (in): 68 Pulse (bpm): 72 Weight (lbs): 132 Respiratory Rate (breaths/min): 18 Body Mass Index (BMI): 20.1 Blood Pressure (mmHg): 161/64 Reference Range: 80 - 120 mg / dl Electronic Signature(s) Signed: 11/02/2020 9:08:43 AM By: Carlene Coria RN Entered By: Carlene Coria on 10/31/2020 13:26:49

## 2020-11-07 ENCOUNTER — Other Ambulatory Visit: Payer: Self-pay

## 2020-11-07 ENCOUNTER — Telehealth: Payer: Self-pay | Admitting: Surgery

## 2020-11-07 ENCOUNTER — Encounter (HOSPITAL_BASED_OUTPATIENT_CLINIC_OR_DEPARTMENT_OTHER): Payer: Medicare Other | Admitting: Internal Medicine

## 2020-11-07 ENCOUNTER — Ambulatory Visit (HOSPITAL_COMMUNITY)
Admission: RE | Admit: 2020-11-07 | Discharge: 2020-11-07 | Disposition: A | Discharge status: Home / Self Care | Payer: Medicare Other | Source: Ambulatory Visit | Attending: Internal Medicine | Admitting: Internal Medicine

## 2020-11-07 ENCOUNTER — Other Ambulatory Visit (HOSPITAL_COMMUNITY): Payer: Self-pay | Admitting: Internal Medicine

## 2020-11-07 DIAGNOSIS — L97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: Secondary | ICD-10-CM | POA: Diagnosis not present

## 2020-11-07 DIAGNOSIS — L97811 Non-pressure chronic ulcer of other part of right lower leg limited to breakdown of skin: Secondary | ICD-10-CM | POA: Diagnosis not present

## 2020-11-07 DIAGNOSIS — L89616 Pressure-induced deep tissue damage of right heel: Secondary | ICD-10-CM | POA: Diagnosis not present

## 2020-11-07 DIAGNOSIS — I87331 Chronic venous hypertension (idiopathic) with ulcer and inflammation of right lower extremity: Secondary | ICD-10-CM | POA: Diagnosis not present

## 2020-11-07 DIAGNOSIS — L97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: Secondary | ICD-10-CM | POA: Diagnosis not present

## 2020-11-07 DIAGNOSIS — M7989 Other specified soft tissue disorders: Secondary | ICD-10-CM | POA: Diagnosis not present

## 2020-11-07 DIAGNOSIS — I83222 Varicose veins of left lower extremity with both ulcer of calf and inflammation: Secondary | ICD-10-CM | POA: Diagnosis not present

## 2020-11-07 DIAGNOSIS — M79661 Pain in right lower leg: Secondary | ICD-10-CM | POA: Diagnosis not present

## 2020-11-07 MED ORDER — RIVAROXABAN (XARELTO) VTE STARTER PACK (15 & 20 MG): ORAL_TABLET | ORAL | 0 refills | Status: DC

## 2020-11-07 NOTE — Telephone Encounter (Signed)
The patient was sent over from the wound center for right leg pain and swelling.  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  I am starting her on Xarelto.  She will continue her Plavix.  She will contact her PCP to check he hemoglobin as she has had anemic issues in the past.  3 months, I will consider referral to hematology to help determine the length of anticoagulation  [name] [name],

## 2020-11-08 ENCOUNTER — Telehealth: Payer: Self-pay

## 2020-11-08 ENCOUNTER — Telehealth: Payer: Self-pay | Admitting: Internal Medicine

## 2020-11-08 ENCOUNTER — Telehealth (HOSPITAL_COMMUNITY): Payer: Self-pay | Admitting: *Deleted

## 2020-11-08 NOTE — Telephone Encounter (Signed)
Yes- will need brief OV with each lab draw to see if stable. When do they want [place] to start this and for how long?

## 2020-11-08 NOTE — Telephone Encounter (Signed)
LVM to CB.

## 2020-11-08 NOTE — Progress Notes (Signed)
[name] [name], [name] [name] ([id number]) Visit Report for 11/07/2020 HPI Details Patient Name: Date of Service: [name] LO [name] 11/07/2020 1:00 PM Medical Record Number: [id number] Patient Account Number: [id number] Date of Birth/Sex: Treating RN: 09/04/1951 (69 y.o. [name] Primary Care Provider: [name] Other Clinician: Referring Provider: Treating Provider/Extender: [name] in Treatment: 450 History of Present Illness HPI Description: this is a patient who initially came to [place] for wounds on the medial malleoli bilaterally as well as her upper medial lower extremities bilaterally. These wounds eventually healed with assistance of Apligraf's bilaterally. While this was occurring she developed the current wound which opened into a fairly substantial wound on the right lower extremity. These are mostly secondary to venous stasis physiology however the patient also has underlying scleroderma, pulmonary hypertension. The wound has been making good progress lately with the Hydrofera Blue-based dressing. 03/17/2015; patient had a arterial evaluation a year ago. Her right ABI was 0.86 left was 1.0. T brachial index was 0.41 on the right 0.45 on the left. Her oe bilateral common femoral artery waveforms were triphasic. Her white popliteal posterior tibial artery and anterior tibial artery waveforms were monophasic with good amplitude. Luteal artery waveforms were biphasic it was felt that her bilateral great toe pressures are of normal although adequate for tissue healing. 03/24/2015. The condition of this wound is not really improved that. He was covered with as fibrinous surface slough and eschar. This underwent an aggressive debridement with both a curette and scalpel. I still cannot really get down to what I can would consider to be a viable surface. There is no evidence of infection. Previous workup for ischemia roughly a year ago was negative nevertheless I think  that continues to be a concern 04/07/15. The patient arrived for application of second Apligraf. Once again the surface of this wound is certainly less viable than I would like for an advanced treatment option. An aggressive debridement was done. She developed some arteriolar bleeding which required that along pressure and silver alginate. 04/21/15: change in the condition of this wound. Once again it is covered in a gelatinous surface slough. After debridement today surface of the wound looks somewhat better but now a heeling surface 05/05/15 Apligraf #4 applied.Still a lot of slough on this wound. 05/19/15 Apligraf #5 applied. Still a lot of slough on this wound I did not aggressively remove this 06/02/15; continued copious amounts of surface slough. This debridement fairly easily. 06/08/2015 -- the last time she had a venous duplex study done was over 3 years ago and the surgery was prior to that. I have recommended that she sees Dr. early for a another opinion regarding a repeat venous duplex and possibly more endovenous ablation of vein stripping of micro-phlebotomies. 06/16/15; wound has a gelatinous surface eschar that the debridement fairly easily to a point. I don't disagree with the venous workup and perhaps even arterial re-evaluation. She is on prednisone 5 mg and continue his medications for her pulmonary artery hypertension I am not sure if the latter have any wound care healing issues I would need to investigate this. 06/23/15 continues with a gelatinous surface eschar with of fibrinous underlying. What I can see of this wound does not look unhealthy however I just can't get this material which I think is 2 different layers off. Empiric culture done 06/30/15; unfortunately not a lot of change in this wound. A gelatinous surface eschar is easily removed however it has a tight fibrinous  surface underneath the. 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 The wound comes back and  basically and unchanged state the. She has a gelatinous surface that is more easily removed however there is a tightly fibrinous surface underneath the. There is no evidence of infection. She has a vascular follow-up next month. I would have to inject her in order to do a more aggressive debridement of this area 07/21/15 the wound is roughly in the same state albeit the debridement is done with greater ease. There is less of the fibrinous eschar underneath the. There is no evidence of infection. She has follow-up with vascular surgery next week. No evidence of surrounding infection. Her original distal wounds healed while this one formed. 07/28/15; wound is easier to debride. No wound erythema. She is seeing Dr. [name] next week. 08/18/15 Has been to [facility] for repeat ablation. Have been using medihoney pad with some improvement 08/25/15; absolutely no change in the condition of this wound in either its overall size or surface condition in many months now. At one point I had this down to [place] healthier surface I think with [facility] however this did not actually progress towards closure. Do not believe that the wound has actually deteriorated in terms of volume at all. We have been using a medihoney pad which allows easier debridement of the gelatinous eschar but again no overall actual improvement. the patient is going towards an ablation with pain and vascular which I think is scheduled for next week. The only other investigation that I could first see would be a biopsy. She does have underlying scleroderma 09/02/15 eschar is much easier to debridement however the base of this does not look particularly vibrant. We changed to Iodoflex. The patient had her ablation earlier this week 09/09/15 again the debridement over the base of this wound is easier and the base of the wound looks considerably better. We will continue the Iodoflex. Dr. Tawni Millers has expressed his satisfaction with the result of her  ablation 09/15/15 once again the wound is relatively free of surface eschar. No debridement was done today. It has a pale-looking base to it. although this is not as deep as it once was it seems to be expanding especially inferiorly. She has had recent venous ablation but this is no closer to healing.I've gone ahead and done a punch biopsy this from the inferior part of the wound close to normal skin 09/22/15: the wound is relatively free of surface eschar. There is some surrounding eschar. I'm not exactly sure at what level the surface is that I am seeing. 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 No evidence of atypical infection, malignancy. 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 No debridement was done 09/29/15; the wound surface is relatively free of surface eschar. There is eschar surrounding the walls of the wound. Aside from the improvement in the amount of surface slough. This wound has not progressed any towards closure. There is not even a surface that looks like there at this is ready. There is no evidence of any infection nor maligancy based on biopsy I did on 9/15. I continued with the Iodoflex however I am looking towards some alternative to try to promote some closure or filling in of this surface. Consider triple layer Oasis. Collagen did not result in adequate control of the surface slough 10/13/15; the patient was in hospital last week with severe anemia. The wound looks somewhat better  after debridement although there is widening medially. There is no evidence of infection. 10/20/15; patient's wound on the right lateral lower leg is essentially unchanged. This underwent a light surface debridement and in general the debridement is easier and the surface looks improved. I noted in doing this on the side of the wound what appears to be a piece of calcium deposition. The patient noted that she had previous things on the tips of  her fingers. 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 She follows with rheumatology and I have asked them to talk to her about this. In view of that S the nonhealing ulcer may have something to do with calcinosis and also unrelieved Raynaud's disease in this area. I should note that her original wounds on the right and left medial malleolus and the inner aspects of both legs just below the knees did however heal over 10/30/15; the patient's wound on the right lateral lower leg is essentially unchanged. I was able to remove some calcified material from the medial wound edge. 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 Otherwise the wound appears essentially unchanged there is less adherent surface eschar. Some of the calcified material was sent to pathology for analysis 11/17/15. The patient's wound on the right lateral leg is essentially unchanged. Wider Medially. 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 There is some surface slough but I did not debridement this. No evidence of surrounding infection. 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 This will make healing difficult 12/15/15; the wound has less depth surface has a fibrinous slough and calcifications in the wound edge no evidence of infection 12/22/15; the wound definitely has less Fibrinous slough on the base. Calcifications around the wound edges are still evident. Although the wound bed looks healthier it is still pale in appearance. Previous biopsy did not show malignancy 01/04/15; surgical debridement of nonviable slough and  subcutaneous tissue the wound cleans up quite nicely but appears to be expanding outward calcifications around the wound edges are still there. 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 She is to see her rheumatologist I'll see if he has any opinions on this. My punch biopsy done in September did not show calcifications although these are clearly evident. 01/19/16 light selective debridement of nonviable surface slough. There is epithelialization medially. This gives me reason for cautious optimism. She has been to see her rheumatologist, there is nothing that can be done for this type of soft tissue calcification associated with scleroderma 02/02/16 no debridement although there is a light surface slough. She has 2 peninsulas of skin 1 inferiorly and one medially. We continue to make a slight and slow but definite progressive here 02/16/16; light surface debridement with more attention to the circumference of the wound bed where the fibrinous eschar is more prevalent. No calcifications detected. She seems to have done nicely with the [facility] with some epithelialization and some improvement in the overall wound volume. 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 Continued attention to the circumference  of the wound where the fibrinous eschar in calcinosis or prevalent. No calcifications were detected. She has continued improvement with Hydrofera Blue. The wound is no longer as deep 03/15/16 surgical debridement done to remove surface escha Especially around the circumference of the wound where there is nonviable subcutaneous tissue. In spite of this there is considerable improvement in the overall dimensions and depth of the wound. Islands of epithelialization are seen especially medially inferiorly and superiorly to a lesser extent.  She is using Hydrofera Blue at home 03/29/16; surgical debridement done to remove surface eschar and nonviable subcutaneous tissue. This cleans up quite nicely mention slightly larger in terms of length and width however depth is less 04/12/16; continued gradual improvement in terms of depth and the condition of the wound base. Debridement is done. Continuing long standing Hydrofera Blue at home with Kerlix Coban wraps 04/26/16; continued gradual improvement in terms of depth and management as well as condition of the wound base. Surgical debridement done she continues with Hydrofera Blue. 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 She initially came to this clinic venous insufficiency ulcers which have long since healed 05/17/16 continued improvement in terms of the depth and measurements of this wound although she has a tightly adherent fibrinous slough each time. We've been continued with long standing Hydrofera Blue which seems to done as well for this wound is many advanced treatment options. 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 She also has chronic venous insufficiency. She has an irritation on her lateral right ankle secondary to our wraps 05/10/16; wound appears to be smaller especially on the medial aspect and especially in the width. Wound was debridement surface looks better. She is also been in the hospital apparently with anemia again she tells me she had an endoscopy. Since she got home after 3 days which I believe was sometime last week she has had an irritated painful area on the right lateral ankle surrounding the lateral malleolus 05/31/16; much more adherent surface slough today then recently although I don't think the dimensions of changed that much. A more aggressive debridement is required. The irritated area over her medial malleolus is more pruritic and painful and I don't think represents  cellulitis 06/14/16; no major change in her wound dimensions however there is more tightly adherent surface slough which is disappointing. As well as she appears to have a new small area medially. Furthermore an irritated uncomfortable area on the lateral aspect of her right foot just below the lateral malleolus. 06/21/16; I'm seeing the patient back in follow-up for the new areas under her major wound on the right anterior leg. She has been using Hydrofera Blue to this area probably for several months now and although the dressings seem to be helping for quite a period of time I think things have stagnated lately. She comes in today with a relatively tight adherent surface slough and really no changes in the wound shape or dimension. The 2 small areas she had inferiorly are tiny but still open they seem improved this well. There is no uncontrolled edema and I don't think there is any evidence of cellulitis. 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 Patient has had vascular evaluations both venous and arterial. I have biopsied this area. There is no obvious infection. The worrisome thing today is that she  seems to be developing areas of erythema and epithelial damage on the medial aspect of the right foot. Also to a lesser degree inferior to the actual wound itself. Again I see no obvious changes to suggest cellulitis however as this is the only treatable option I will probably give her antibiotics. 07/13/16 no major change in the lady's large anterior right leg wound. Still covered with a very tightly adherent surface slough which is difficult to debridement. There is less erythema around this, culture last week grew pseudomonas I gave her ciprofloxacin. The area on her lateral right malleolus looks better- 08/02/16 the patient's wounds continued to decline. Her original large anterior right leg  wound looks deeper. Still adherent surface slough that is difficult to debridement. She has a small area just below this and a punched-out wound just below her lateral malleolus. In the meantime she is been in hospital with apparently an upper GI bleed on Plavix and aspirin. She is now just on Plavix she received 3 units of packed cells 08/23/16; since I last saw this 3 weeks ago, the open large area on her right leg looks about the same syrup. She has a small satellite lesion just underneath this. The area on her medial right ankle is now a deep necrotic wound. I attempted to debridement this however there is just too much pain. 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 She follows with vascular surgery and is scheduled for an angiogram in early September 09/06/16; the patient is going for an arteriogram tomorrow. Her original large wound on the right calf is about the same the satellite lesion underneath it is about the same however the area on her medial ankle is now deeper with exposed tendon. I am no longer attempting to debride these wounds 09/20/16; the patient has undergone a right femoral endarterectomy and Dacron patch angioplasty. This seems to have helped the flow in her right leg. 10/04/16; Arrived today for aggressive debridement of the wounds on her right calf the original wound the one beneath it and a difficult area over her right lateral leg just above the lateral malleolus. 10/25/16; her 3 open wounds are about the same in terms of dimensions however the surface appears a lot healthier post debridement. Using Iodoflex 10/18/16 we have been using Iodoflex to her wounds which she tolerated with some difficulty. 10/11/16; has been using Santyl for a period of time with some improvement although again very adherent surface slough would prevent any attempted healing this. She has a original wound on the left calf, the  satellite underneath that and the most recent wound on the right medial ankle. She has completed revascularization by Dr. early and has had venous ablation earlier. Want to go back to Iodoflex to see if week and get a healthier surface to this wound bed failing this I think she'll need to be taken to the OR and I am prepared to call Dr. Marla Roe to discuss this. She is obviously not a good candidate for general anesthesia however.; 11/08/16; I put her on Iodoflex last time to see if I can get the wound bed any healthier and unfortunately today still had tremendous surface slough. 11/15/16; 4 weeks' worth of Iodoflex with not much improvement. Debridement on the major wounds on her left anterior leg is easier however this does not maintain from week to week. The punched-out area on her medial right ankle 11/29/16; I attempted to  change the patient last visit to [facility] however she states this burned and was very uncomfortable therefore we gave her permission to go back to the eye out of complex which she already had at home. Also she noted a lot of pain and swelling on the lateral aspect of her leg before she traveled to [facility] for the holidays, I called her in doxycycline over the phone. This seems to have helped 12/06/16; Wounds unchanged by in large. Using Iodoflex 12/13/16; her wounds today actually looks somewhat better. The area on the right lateral lower leg has reasonably healthy-looking granulation and perhaps as actually filled and a bit. Debridement of the 2 wounds on the medial calf is easier and post debridement appears to have a healthier base. We have referred her to Dr. Migdalia Dk for consideration of operative debridement 12/20/16; we have a quick note from Dr. Merri Ray who feels that the patient needs to be referred to an academic center/plastic surgeon. This is due to the complexity of the patient's medical issues as it applies to anything in the OR. We  have been using Iodoflex 12/27/16; in general the wounds on the right leg are better in terms of the difficult to remove surface slough. She has been using Iodoflex. She is approved for Apligraf which I anticipated ordering in the next week or 2 when we get a better-looking surface 01/04/16 the deep wounds on the right leg generally look better. Both of them are debrided further surface slough. The area on the lateral right leg was not debrided. She is approved for Apligraf I think I'll probably order this either next week or the week after depending on the surface of the wounds superiorly. We have been using Iodoflex which will continue until then 01/11/16; the deep wounds on the right leg again have a surface slough that requires debridement. I've not been able to get the wound bed on either one of these wounds down to what would be acceptable for an advanced skin stab-like Apligraf. The area on the medial leg has been improving. We have been using hot Iodoflex to all wounds which seems to do the best at at least limiting the nonviable surface 01/24/17; we have continued Iodoflex and all her wound areas. Her debridement Gen. he is easier and the surface underneath this looks viable. Nevertheless these are large area wounds with exposed muscle at least on the anterior parts. We have ordered Apligraf's for 2 weeks from now. The patient will be away next week 02/07/17; the patient was close to have first Apligraf today however we did not order one. I therefore replaced her Iodoflex. She essentially has 3 large punched- out areas on her right anterior leg and right medial ankle. 02/11/17; Apligraf #1 02/25/17; Apligraf #2. In general some improvement in the right medial ankle and right anterior leg wounds. The larger wound medially perhaps some better 03/11/17; Apligraf #3. In general continued improvement in the right medial ankle and the right anterior leg wounds. 03/25/17 Apligraf #4. In general continued  improvement especially on the right medial ankle and the lower 04/08/17; Apligraf #5 in general continued improvement in all wound sites. 04/22/17; post Apligraf #5 her wound beds continued to look a lot better all of them up to the surface of the surrounding skin. Had a caramel-colored slough that I did not debrided in case there is residual Apligraf effect. The wounds are as good as I have seen these looking quite some time. 04/29/17; we applied [employer] and last week after completing her Apligraf. Wounds look as though they've contracted somewhat although they have a nonviable surface which was problematic in the past. Apparently she has been approved for further Apligraf's. We applied Iodosorb today after debridement. 05/06/17; we're fortunate enough today to be able to apply additional Apligraf approved by her insurance. In general all of her wounds look better Apligraf #6 05/24/17; Apligraf #7 continued improvement in all wounds 3 06/10/17 Apligraf #8. Continued improvement in the surface of all wounds. Not much of an improvement in dimensions as I might a follow 06/24/17 Apligraf #9 continued general improvement although not as much change in the wound areas I might of like. She has a new open area on the right anterior lateral ankle very small and superficial. She also has a necrotic wound on the tip of her right index finger probably secondary to severe uncontrolled Raynaud's phenomenon. She is already on sildenafil and already seen her rheumatologist who gave her Keflex. 07/08/17; Apligraf #10. Generalized improvement although she has a small additional wound just medial to the major wound area. 07/22/17; after discussion we decided not to reorder any further Apligraf's although there is been considerable improvement with these it hasn't been recently. The major wound anteriorly looks better. Smaller wounds beneath this and the more recent one and laterally look about the same. The area on the  right lateral lower leg looks about the same 07/29/17 this is a patient who is exceedingly complex. She has advanced scleroderma, crest syndrome including calcinosis, PAD status post revascularization, chronic venous insufficiency status post ablations. She initially presented to this clinic with wounds on her bilateral lower legs however these closed. More recently we have been dealing with a large open area superiorly on the right anterior leg, a smaller wound underneath this and then another one on the right medial lower extremity. These improve significantly with 10 Apligraf applications. Over the last 2-3 weeks we are making good progress with Hydrofera Blue and these seem to be making progress towards closure 08/19/17; wounds continued to make good improvement with Hydrofera Blue and episodic aggressive debridements 08/26/17; still using Hydrofera Blue. Good improvements 09/09/17; using Hydrofera Blue continued improvement. Area on the lateral part of her right leg has only a small remaining open area. The small inferior satellite region is for all intents and purposes closed. Her major wound also is come in in terms of depth and has advancing epithelialization. 09/16/17; using Hydrofera Blue with continued improvement. The smaller satellite wound. We've closed out today along with a new were satellite wound medially. The area on her medial ankle is still open and her major wound is still open but making improvement. All using Hydrofera Blue. Currently 09/30/17; using Hydrofera Blue. Still a small open area on the lateral right ankle area and her original major wound seems to be making gradual and steady improvement. 10/14/17; still using Hydrofera Blue. Still too small open areas on the right lateral ankle. Her original major wound is horizontal and linear. The most problematic area paradoxically seems to be the area to the medial area wears I thought it would be the lateral. 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 10/28/17; still using Hydrofera Blue. Right lateral ankle has a very small open area superiorly on the most lateral part of the wound. Her original open wound has 2 open areas now separated by normal skin and we've redefined this. 11/11/17; still using Hydrofera Blue  area and right lateral ankle continues to have a small open area on mostly the lateral part of the wound. Her original wound has 2 small open areas now separated by a considerable amount of normal skin 11/28/17; the patient called in slightly before Thanksgiving to report pain and erythema above the wound on the right leg. In the past this is responded well to treatment for cellulitis and I gave her over the phone doxycycline. She stated this resulted in fairly abrupt improvement. We have been using Hydrofera Blue for a prolonged period of time to the larger wound anteriorly into the remaining wound on her right right lateral ankle. The latter is just about closed with only a small linear area and the bottom of the [place]. 12/02/17; use endoform and left the dressing on since last visit. There is no tenderness and no evidence of infection. 12/16/17; patient has been using Endoform but not making much progress. The 2 punched out open areas anteriorly which were the reminiscence of her major wound appear deeper. The area on the lateral aspect of her right calf also appears deeper. Also she has a puzzling tender swelling above her wound on the right leg. This seems larger than last time. Just above her wounds there appears to be some fluctuance in this area it is not erythematous and there is no crepitus 12/30/17; patient has been using Endoform up until last week we used Hydrofera Blue. Ultrasound of the swelling above her 2 major wounds last time was negative for a fluid collection. I gave her cefaclor for the erythema and tenderness in this area which seems better.  Unfortunately both punched out areas anteriorly and the area on her right medial lower leg appear deeper. In fact the lateral of the wounds anteriorly actually looks as though it has exposed tendon and/or muscle sheath. She is not systemically unwell. She is complaining of vaginitis type symptoms presumably Candida from her antibiotics. 01/06/18; we're using santyl. she has 2 punched out areas anteriorly which were initially part of a large wound. Unfortunately medially this is now open to tendon/muscle. All the wounds have the same adherent very difficult to debride surface. 01/20/18; 2 week follow-up using Santyl. She has the 2 punched out areas anteriorly which were initially part of her large surface wound there. Medially this still has exposed muscle. All of these have the same tightly adherent necrotic surface which requires debridement. PuraPly was not accepted by the patient's insurance however her insurance I think it changed therefore we are going to run Apligraf to gain 02/03/18; the patient has been using Santyl. The wound on the right lateral ankle looks improved and the 2 areas anteriorly on the right leg looks about the same. The medial one has exposed muscle. The lateral 1 requiresdebridement. We use PuraPly today for the first week 02/10/18; PuraPly #2. The patient has 3 wounds. The area on the right lateral ankle, 2 areas anteriorly that were part of her original large wound in this area the medial one has exposed muscle. All of the wounds were lightly debrided with a number 3 curet. PuraPly #2 applied the lateral wound on the calf and the right lateral ankle look better. 02/17/18; PuraPly #3. Patient has 3 wounds. The area on the right lateral ankle in 2 areas internally that were part of her original large wound. The lateral area has exposed muscle. She arrived with some complaints of pain around the right ankle. 02/24/18; PuraPly #4; not much change in any of  the 3 wound areas. Right  lateral ankle, right lateral calf. Both of these required debridement with a #3 curet. She tolerates this marginally. The area on the medial leg still has exposed muscle. Not much change in dimensions 03/03/18 PuraPly #5. The area on the medial ankle actually looks better however the 2 separate areas that were original parts of the larger right anterior leg wound look as though they're attempting to coalesce. 03/10/18; PuraPly #6. The area on the medial ankle actually continues to look and measures smaller however the 2 separate areas that were part of the original large wound on the right anterior leg have now coalesced. There hasn't been much improvement here. The lateral area actually has underlying exposed muscle 03/17/18-she is here in follow-up evaluation for ulcerations to the right lower extremity. She is voicing no complaints or concerns. She tolerated debridement. Puraply#7 placed 03/24/18; difficult right lower extremity ulcerations. PuraPly #8 place. She is been approved for [employer]. She did very well with Apligraf today however she is apparently reached her "lifetime max" 03/31/18; marginal improvement with PuraPly although her wounds looked as good as they have in several weeks today. Used TheraSkins #1 04/14/18 TheraSkin #2 today 04/28/18 TheraSkins #3. Wound slightly improved 05/12/18; TheraSkin skin #4. Wound response has been variable. 05/27/18 TheraSkin #5. Generally improvement in all wound areas. I've also put her in 3 layer compression to help with the severe venous hypertension 06/09/18; patient is done quite well with the TheraSkins unfortunately we have no further applications. I also put her in 3 layer compression last week and that really seems to of helped. 06/16/18; we have been using silver collagen. Wounds are smaller. Still be open area to the muscle layer of her calf however even that is contracted somewhat. She tells me that at night sometimes she has pain on the right  lateral calf at the site of her lower wound. Notable that I put her into 3 layer compression about 3 weeks ago. She states that she dangles her leg over the bed that makes it feel better but she does not describe claudication during the day She is going to call her secondary insurance to see if they will continue to cover advanced treatment products I have reviewed her arterial studies from 01/22/17; this showed an ABI in the right of 1 and on the left noncompressible. TBI on the right at 0.30 on the left at 0.34. It is therefore possible she has significant PAD with medial calcification falsely elevating her ABI into the normal range. I'll need to be careful about asking her about this next week it's possible the 3 layer compression is too much 06/23/18; was able to reapply TheraSkin 1 today. Edema control is good and she is not complaining of pain no claudication 07/07/18;no major change. New wound which was apparently a taper removal injury today in our clinic between her 2 wounds on the right calf TheraSkins #2 07/14/18; I think there is some improvement in the right lateral ankle and the medial part of her wound. There is still exposed muscle medially. 07/28/18; two-week follow-up. TheraSkins#3. Unfortunately no major change. 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 She seems to be doing well and in the base of the wound appears to show some progress at this point. This is the last approved [employer]in  of the second set. 08/25/18; she has completed TheraSkin. There has been some improvement on the right lateral calf wound as well as the anterior leg wounds. The open area to muscle medially on the anterior leg wound is smaller. I'm going to transition her back to [facility] under Kerlix Coban change every  second day. She reports that she had some calcification removed from her right upper arm. We have had previous problems with calcifications in her wounds on her legs but that has not happened recently 09/08/18;using Hydrofera Blue on both her wound areas. Wounds seem to of contracted nicely. She uses Kerlix Coban wrap and changes at home herself 09/22/2018; using Hydrofera Blue on both her wound areas. Dimensions seem to have come down somewhat. There is certainly less depth in the medial part of the mid tibia wound and I do not think there is any exposed muscle at this point. 10/06/2018; 2-week follow-up. Using [facility] on her wound areas. Dimensions have come down nicely both on the right lateral ankle area in the right mid tibial area. She has no new complaints 10/20/2018; 2-week follow-up. She is using Hydrofera Blue. Not much change from the last time she was here. The area on the lateral ankle has less depth although it has raised edges on one side. I attempted to remove as much of the raised edge as I could without creating more additional wounds. The area on the right anterior mid tibia area looks the same. 11/03/2018; 2-week follow-up she is using Hydrofera Blue. On the right anterior leg she now has 2 wounds separated by a large area of normal skin. The area on the medial part still has I think exposed muscle although this area itself is a lot smaller. The area laterally has some depth. Both areas with necrotic debris. The area on her right lateral ankle has come in nicely 11/17/2018; patient continues to use Hydrofera Blue. We have been increasing separation of the 2 wounds anteriorly which were at one point joint the area on the right lateral calf continues to have I think some improvement in depth. 12/08/2018; patient continues to use Hydrofera Blue. There is some improvement in the area on the right lateral calf. The 2 areas that were initially part of the original large wound in the  mid right tibia are probably about the same. In fact the medial area is probably somewhat larger. We will run puraply through the patient's insurance 12/22/2018; she has been using Hydrofera Blue. We have small wounds on the right lateral calf and 2 small areas that were initially part of a large wound in the right mid tibia. We applied pure apply #1 today. 12/29/2018; we applied puraply #2. Her wounds look somewhat better especially on the right lateral calf and the lateral part of her original wound in the mid tibial area. 01/05/2019; perhaps slightly improved in terms of wound bed condition but certainly not as much improvement as I might of liked. Puraply #3 1/13: we did not have a correct sized puraply to apply. wounds more pinched out looking, I increased her compression to 3 layer last week to help with significant multilevel venous hypertension. Since then I've reviewed her arterial status. She has a right femoral endarterectomy and a distal left SFA stent. She was being followed by Dr. [name] for a period however she does not appear to have seen him in 3 years. I will set up an appointment. 1/20. The patient has an additional wound on the right  lateral calf between the distal wound and proximal wounds. We did not have Puraply last week. Still does not have a follow-up with Dr. early 1/27: Follow-up with Dr. early has been arranged apparently with follow-up noninvasive studies. Wounds are measuring roughly the same although they certainly look smaller 2/3; the patient had non invasive studies. Her ABIs on the right were 0.83 and on the left 1.02 however there was no great toe pressure bilaterally. Also worrisome monophasic waveforms at the PTA and dorsalis pedis. We are still using Puraply. We have had some improvement in all of the wounds especially the lateral part of the mid tibial area. 2/10; sees Dr. early of vein and vascular re-arterial studies next week. Puraply reapplied today. 2/17;  Dr. early of vein and vascular his appointment is tomorrow. Puraply reapplied after debridement of all wounds 2/24; the patient saw Dr. early I reviewed his note. sHe noted the previous right femoral endarterectomy with a Dacron patch. He also noted the normal ABI and the monophasic waveforms suggesting tibial disease. Overall he did not feel that she had any evidence of arterial insufficiency that would impair her wound healing. He did note her venous disease as well. He suggested PRN follow-up. 3/2; I had the last puraply applied today. The original wounds over the mid tibia area are improved where is the area on the right lower leg is not 3/9; wounds are smaller especially in the right mid tibia perhaps slightly in the right lateral calf. We finished with puraply and went to endoform today 3/23; the patient arrives after 2 weeks. She has been using endoform. I think all of her wounds look slightly better which includes the area on the right lateral calf just above the right lateral malleolus and the 2 in the right mid tibia which were initially part of the same wound. 4/27; TELEHEALTH visit; the patient was seen for telehealth visit today with her consent in the middle of the worldwide epidemic. Since she was last here she called in for antibiotics with pain and tenderness around the area on the right medial ankle. I gave her empiric doxycycline. She states this feels better. She is using endoform on both of these areas 5/11 TELEHEALTH; the patient was seen for telehealth visit today. She was accompanied at home by her husband. She has severe pulmonary hypertension accompanied scleroderma and in the face of the Covid epidemic cannot be safely brought into our clinic. We have been using endoform on her wound areas. There are essentially 3 wound areas now in the left mid tibia now 2 open areas that it one point were connected and one on the right lateral ankle just above the malleolus. 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 She cannot come to clinic. The wounds were reviewed today via telehealth. She has severe chronic venous hypertension which I think is centrally mediated. She has wounds on her right anterior tibia and right lateral ankle area. These are chronic. She has been using endoform. 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 We have been following her from telehealth. She has severe chronic venous hypertension which may be mostly centrally mediated secondary to right heart heart failure. She has wounds on her  right anterior tibia and right lateral ankle these are chronic we have been using endoform 6/22; TELEHEALTH; this patient was seen today via telehealth. 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 Since the last time we had contact with this patient she developed some pain and erythema around the wound on her right lateral malleolus/ankle and we put in antibiotics for her. This is resulted in good improvement with resolution of the erythema and tenderness. I changed her to silver alginate last time. We had been using endoform for an extended period of time 7/13; TELEHEALTH; this patient was seen today via telehealth. 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 She would continue to be at a prohibitive risk to be brought into our clinic unless this was absolutely necessary. These visits have been done with her approval as well as her husband. We have been using silver alginate to the areas on the right mid tibia and right lateral lower leg. 7/27  TELEHEALTH; patient was seen along with her husband today via telehealth. 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 We changed her to sample last visit. She has 2 areas a chronic wound on her right mid tibia and one just above her ankle. These were not the original wounds when she came into this clinic but she developed them during treatment 8/17; she comes in for her first face-to-face visit in a long period. She has a remaining area just medial to the right tibia which is the last open part of her large wound across this area. She also has an area on the right lateral lower leg. We prescribed Santyl last telehealth visit but they were concerned that this was making a deeper so they put silver alginate on it last week. Her husband changes the dressings. 8/31; using Santyl to the 2 wound areas some improvement in wound surfaces. Husband has surgery in 2 weeks we will put her out 3 weeks. Any of the advanced treatment options that I can think of that would be eligible for this wound would also cause her to have to come in weekly. The risk that the patient is just too high 9/21. Using Santyl to the 2 wound areas. Both of these are somewhat better although the medial mid tibia area still has exposed muscle. Lateral ankle requiring debridement. Using Santyl 10/12; using Santyl to the 2 wound areas. One on the right lateral ankle and the other in the medial calf which still has exposed muscle. Both areas have come down in size and have better looking surfaces. She has made nice progress with santyl 11/2; we have been using Santyl to the 2 wound areas. Right lateral ankle and the other in the mid tibia area the medial part of this still has exposed muscle. 11/23/2019 on evaluation today patient appears to be doing about the same really with regard to her wounds. She is actually not very pleased with how things seem to  be progressing at this point she tells me that she really has not noted much improvement unfortunately. With that being said there is no signs of active infection at this time. There is some slough buildup noted at this time which again along with some dry skin around the edges of the wound I think would benefit her to try to debride some of  this away. Fortunately her pain is doing fairly well. She still has exposed muscle in the right medial/tibial area. 12/14; TELEHEALTH; she was changed to [facility] to the right calf wound and right lateral ankle wound when she was here last time. Unfortunately since then she had a fall with a pelvic fracture and a fracture of her wrist. She was apparently hospitalized for 5 days. I have not looked at her discharge summary. She apparently came out of the hospital with a blister on her right heel. She was seen today via telehealth by myself and our case manager. The patient and her husband were present. She has been using Hydrofera Blue at our direction from the last time she was in the clinic. There is been no major improvement in fact the areas appear deeper and with a less viable surface 01/04/2020; TELEHEALTH; the patient was seen today in accompaniment of her husband and our nurse. She has 3 open areas 1 on the right medial mid tibia, one on the right lateral ankle and a large eschared pressure area on the right heel. We have been using Iodoflex to the 2 original wounds. 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 It is simply too perilous for her to be seen in any other way 1/26; TELEHEALTH; the patient was seen today in accompaniment of her husband and our RN one of our nurses. She still has the 3 open areas 1 on the right medial mid tibia which is the remanent of a more extensive wound in this area, one on the right lateral ankle and a large eschared pressure area on the right heel. We have been using Iodoflex to the 2  original wounds and a bed at 9 application to the eschared area on the heel 2/15; the first time we have seen this patient and then several months out of concern for the pandemic. She had a large horizontal wound in the mid tibia. Only the medial aspect of this is still open. Area on the lateral ankle is just about closed. She had a new pressure ulcer on the right heel which I have removed some of the eschar. We have been using Iodoflex which I will continue. The area in the mid tibia has a round circle in the middle of exposed muscle. I think we would have to use an advanced treatment product to stimulate granulation over this area. We will run this through her insurance. She is not eligible for plastic surgery for many reasons 3/1; TELEHEALTH. The patient was seen today by telehealth. 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 We have been using Iodoflex to the wound areas which include the right anterior mid tibia, right lateral ankle and the right heel. All of these were reviewed 3/8; the patient's wound just above the right ankle is closed. She still has a contracting black eschar on the heel where she had a pressure ulcer. The medial part of her original wound on the mid tibia has exposed muscle. We have made really made no progress in this area although we have managed to get a lot of the original wound in this area to close. We used Apligraf #1 today 3/22; the patient's ankle wound remains closed. She still has a contracting black eschar on the heel although it seems to have less surface area. It still not clear whether there is any depth here. We used Apligraf #2  today in an attempt to get granulation over the exposed muscle and what is left of her mid tibia area. 4/5; everything is closed except the medial aspect of the mid tibia wound, as well as the pressure injury on the right heel. She has been  using Betadine to the right heel which has been gradually contracting. We used Apligraf #3 today. 4/19; Apligraf #4. Still a pressure area on the right heel she has been using Betadine superficial excoriated areas she has been applying salicylic acid to on the anterior leg and the thick horn on the leg just above her wound area 5/3; Apligraf #5. Unfortunately she came in today with a reopening of the area on the right lateral lower leg. Not much change in the wound we have been treating in the mid calf. Quite a bit of edema surrounding this wound. She reports that she was up on her feet quite a bit. The area on the right heel is separating she has been using Betadine She comes in with a new wound on the left lateral malleolus which is very disappointing this is been open since last week 5/17; we applied her last Apligraf last time. Unfortunately that did not have any effect on the deep area medially in the mid tibial area. However the area over the right lateral malleolus is a lot better. Right heel also is contracted we used Iodoflex last time. The new wound on the left lateral malleolus were using Santyl to. This required debridement. 6/1. Not much change in the area on the right medial mid tibia. Right lateral ankle is improved she has the necrotic area on the right heel which was a pressure area. New area on the left lateral malleolus from last time. I changed her to Iodoflex to the area on the left lateral malleolus she said this hurt I have put her back on Santyl 6/15; right mid tibia is unchanged. I do not think there is anything I can do to this topically that we will get this to granulate over the muscle. And I am furthermore I am not sure that she is a surgical candidate either because of her severe pulmonary issues or because she really does not want to go through it. The area on the right lateral malleolus is a small punched out area. The area on the left lateral malleolus again small  punched out with nonviable surface Pressure ulcer on the right heel at separating black eschar. I went ahead and remove this today she has an area on the left anterior mid tibia just laterally. Geographic wounds debris on the surface. Some erythema here. 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 There is skin changes widely Change the primary dressing to Sorbact to all wound areas. She needs compression on the left leg as well as the right 6/21; right mid tibia which was her one remaining wound at 1 point is unchanged The area on the right lateral malleolus actually is close to closing over still a small punched out area The area on the left lateral malleolus again a deep wound it is this 1 that she feels pain in. Pressure ulcer on the right heel was cultured today. New area on the left anterior mid tibia several geographic wounds last week in the setting of chronic venous insufficiency this actually looks some better 7/6; Right mid tibia about the same. Exposed muscle Left lateral malleolus again necrotic debris over the surface. Pressure ulcer on the right heel. She finished  the Keflex we prescribed. Necrotic debris debrided with a #3 curette The rest of her wounds on the right mid tibia right lateral ankle are better Her husband reminds me that she had a right femoral endarterectomy and has 2 stents in her left thigh placed in 2011 approximately by vascular surgery in [place]. She saw Dr. [name] last in February 2020. At that point he did not think that the arterial insufficiency she had on the right was sufficient to explain any of her right lower extremity wounds however she now has an area on the left lateral malleolus that looks like an ischemic wound 7/12; we have been using Sorbact all wound areas Right mid tibia about the same exposed muscle Left lateral malleolus small wound with debris in the surface punched out Pressure ulcer of the right heel requiring  debridement of necrotic debris Lateral ankle wound on the right is just about closed Right mid tibia wound about the same still with exposed muscle. 7/26 really no change in any wounds. We have been using Sorbact. She has bilateral lower extremity wounds. This is in the setting of scleroderma, severe venous hypertension. She also has known PAD and I had really hoped to be able to get a review by Dr. [name] but apparently that is not booked until August 31. I have asked her to call back to vein and vascular and get an appointment with somebody a little earlier if that is possible. 8/16; patient's area on the right ankle which was a chronic wound is closed over again. The area on the right mid lower leg, right heel left lateral ankle are all about the same. Pressure ulcer on the right heel is still not viable. New areas identified on the right lateral calf. She has a follow-up with Dr. [name] on 8/31. Phenomenally I would like him to go over her arterial status with regards to her underlying stents. 9/13; patient had her ARTERIAL studies done however apparently Dr. [name] is now in [place] so she did not get an appointment with him on 8/31 and was not referred to any other provider. On the right side her PTA was monophasic. Noncompressible. TBI on the right at 0.41 on the left she was monophasic and noncompressible they did not do a TBI because she had a Band-Aid on her big toe. Patient does not describe claudication although she is having a lot of pain in the left lateral malleolus. 10/11; the patient had a right-sided interventions on 10/5. She had a angioplasty of the right anterior tibial artery as well as the tibioperoneal trunk/peroneal artery she had a drug-coated balloon angioplasty of the right superficial femoral artery. On the left she did not have any interventions yet but is going to have another look at this in 2 weeks. The superficial femoral artery on the left and the associated stent  are patent popliteal artery is patent to the dominant vessel runoff is the anterior tibial which has several high-grade stenosis in the proximal one third. We have been using Sorbact. She also has a new area on the right upper mid tibia. 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 She tolerated this well. She is using Iodoflex and her husband is doing a kerlix Coban on her. She arrives in clinic  today with the original medial part of her mid tibia wound with muscle exposure small area on the right ankle which was part of her original wound she has 2 punched-out areas laterally and 1 above her area anteriorly. Nonviable surfaces similarly the right heel is nonviable. On the left side she has an area on the left foot and the left lateral ankle similar nonviable surfaces. 11/8; patient arrives in clinic today with nothing improved. She has a multiple lightly group of wounds on the right lower leg presumably different etiologies including a skin biopsy, the original wound she had medially and some punched-out areas that are not of clear defined etiology. She says she was in a lot of pain this week could not have her leg up on the bed did not get completely relief from dropping it over the bed. She also has a small area on the left lateral malleolus and the left lateral foot necrotic surfaces on all of these wounds. We have been using Iodoflex I attempted to put her in 3 layer compression last week that did not go well. We will be backing off on kerlix Coban this week Electronic Signature(s) Signed: 11/08/2020 3:11:24 PM By: Linton Ham MD Entered By: Linton Ham on 11/07/2020  14:30:23 -------------------------------------------------------------------------------- Physical Exam Details Patient Name: Date of Service: [name] [name], [name] Durward Parcel F. 11/07/2020 1:00 PM Medical Record Number: [id number] Patient Account Number: [id number] Date of Birth/Sex: Treating RN: [birth year] (69 y.o. [name] Primary Care Provider: [name] Other Clinician: Referring Provider: Treating Provider/Extender: [name] in Treatment: 43 Constitutional Patient is hypertensive.. Pulse regular and within target range for patient.Marland Kitchen Respirations regular, non-labored and within target range.. Temperature is normal and within the target range for the patient.Marland Kitchen Appears in no distress. Respiratory work of breathing is normal. Cardiovascular As opposed to last week I could not feel any pulses in her foot either the dorsalis pedis or the posterior tibial. Significant swelling and tenderness of the right leg warmth noted. These were not in relation to any of her wounds and not clearly infectious.. Notes Wound exam The midportion of the right tibia part of her original wound not changed at all. On the right lateral she has about 2 or 3 punched-out areas 1 of which was a skin cancer biopsy site the other 2 are of unclear etiology. The right lateral ankle part of her original wound is small but again a nonviable surface of the right medial heel again all of these wounds covered in a very adherent necrotic surface. No debridement was done. She has an area on the left lateral foot and left lateral ankle these are small punched out but nonviable surface Electronic Signature(s) Signed: 11/08/2020 3:11:24 PM By: Linton Ham MD Entered By: Linton Ham on 11/07/2020 14:32:17 -------------------------------------------------------------------------------- Physician Orders Details Patient Name: Date of Service: [name] [name], [name] 11/07/2020 1:00 PM Medical Record  Number: [id number] Patient Account Number: [id number] Date of Birth/Sex: Treating RN: 07-08-1951 (69 y.o. 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 Dressing Change Frequency Change Dressing every other day. - all wounds Skin Barriers/Peri-Wound Care Moisturizing lotion - both legs Wound Cleansing May shower with protection. Primary Wound Dressing Wound #16 Right Calcaneus Iodoflex Wound #19 Right,Lateral Malleolus Iodoflex Wound #20 Left,Lateral Malleolus Iodoflex Wound #23 Right,Posterior Lower Leg Iodoflex Wound #24 Right,Anterior Lower Leg Iodoflex Wound #25 Left,Lateral Foot Iodoflex Wound #26 Right,Lateral Lower Leg Iodoflex Wound #27 Right,Distal,Anterior Lower Leg Iodoflex Wound #5 Right,Medial Lower Leg Iodoflex Secondary Dressing Dry Gauze - all wounds Edema Control Kerlix and Coban - Bilateral Avoid standing for long periods of time Elevate legs to the level of the heart or above for 30 minutes daily and/or when sitting, a  frequency of: - throughout the day Off-Loading Turn and reposition every 2 hours Other: - float heels off of bed/chair with pillow under calves Services and Therapies Venous Duplex Doppler, right leg, DVT rule out - Pain, swelling, and redness on right lower leg, rule out DVT - (ICD10 I70.238 - 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) Electronic Signature(s) Signed: 11/07/2020 6:16:40 PM By: Levan Hurst RN, BSN Signed: 11/08/2020 3:11:24 PM By: Linton Ham MD Entered By: Levan Hurst on 11/07/2020 14:11:20 Prescription 11/07/2020 -------------------------------------------------------------------------------- Elta Guadeloupe MD Patient Name: Provider: June 28, 1951 [id number] Date of Birth: NPI#Wanda Plump [id number] Sex: DEA #: [phone] Phone #: License #: [place] Patient Address: [facility] [address] [place], [place] [address] [place], [place] [address] [phone] Allergies Sulfa (Sulfonamide Antibiotics); Levaquin; penicillin Provider's Orders Venous Duplex Doppler, right leg, DVT rule out - ICD10: I70.238 - Pain, swelling, and redness on right lower leg, rule out DVT Hand Signature: Date(s): Electronic Signature(s) Signed: 11/07/2020 6:16:40 PM By: Levan Hurst RN, BSN Signed: 11/08/2020 3:11:24 PM By: Linton Ham MD Entered By: Levan Hurst on 11/07/2020 14:11:25 -------------------------------------------------------------------------------- Problem List Details Patient Name: Date of Service: [name] [name], [name] 11/07/2020 1:00 PM Medical Record Number: [id number] Patient Account Number: [id number] Date of Birth/Sex: Treating RN: [birth year] (69 y.o. 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 02/24/2015 No Yes I87.331 Chronic venous hypertension (idiopathic) with ulcer and inflammation of right 10/04/2016 No Yes lower extremity L89.616 Pressure-induced deep tissue damage of right heel 12/14/2019 No Yes L97.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 05/31/2020 No Yes I70.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 10/31/2020 No Yes lower leg I70.248 Atherosclerosis of native arteries of left leg with ulceration of other part of 10/31/2020 No Yes lower leg Inactive Problems ICD-10 Code Description Active Date Inactive Date 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/19/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 11/07/2020 1:00 PM Medical Record Number: [id number] Patient Account Number: [id number] Date of Birth/Sex: Treating RN: 10-25-1951 (69 y.o. [name] Primary Care Provider: [name] Other Clinician: Referring Provider: Treating Provider/Extender: [name] in Treatment:  450 Subjective History of Present Illness (HPI) this is a patient who initially came to [place] for wounds on the medial malleoli bilaterally as well as her upper medial lower extremities bilaterally. These wounds eventually healed with assistance of Apligraf's bilaterally. While this was occurring she developed the current wound which opened into a fairly substantial wound on the right lower extremity. These are mostly secondary to venous stasis physiology however the patient also has underlying scleroderma, pulmonary hypertension. The wound has been making good progress lately with the Hydrofera Blue-based dressing. 03/17/2015; patient had a arterial evaluation a year ago. Her right ABI was 0.86 left was 1.0. T brachial index was 0.41 on the right 0.45 on the left. Her oe bilateral common femoral artery waveforms were triphasic. Her white popliteal posterior tibial artery and anterior tibial artery waveforms were monophasic with good amplitude. Luteal artery waveforms were biphasic it was felt that her bilateral great toe pressures are of normal although adequate for tissue healing. 03/24/2015. The condition of this wound is not really improved that. He was covered with as fibrinous surface slough and eschar. This underwent an aggressive debridement with both a curette and scalpel. I still cannot really get down to what I can would consider to be a viable surface. There is no evidence of infection. Previous workup for ischemia roughly a year ago was negative nevertheless I think that continues to be a concern 04/07/15. The patient arrived for application of second Apligraf. Once again the surface of this wound is certainly less viable than I would like for an advanced treatment option. An aggressive debridement was done. She developed some arteriolar bleeding which required that along pressure and silver alginate. 04/21/15: change in the condition of this wound. Once again it is covered in a gelatinous  surface slough. After debridement today surface of the wound looks somewhat better but now a heeling surface 05/05/15 Apligraf #4 applied.Still a lot of slough on this wound. 05/19/15 Apligraf #5 applied. Still a lot of slough on this wound I did not aggressively remove this 06/02/15; continued copious amounts of surface slough. This debridement fairly easily. 06/08/2015 -- the last time she had a venous duplex study done was over 3 years ago and the surgery was prior to that. I have recommended that she sees Dr. early for a another opinion regarding a repeat venous duplex and possibly more endovenous ablation of vein stripping of micro-phlebotomies. 06/16/15; wound has a gelatinous surface eschar that the debridement fairly easily to a point. I don't disagree with the venous workup and perhaps even arterial re-evaluation. She is on prednisone 5 mg and continue his medications for her pulmonary artery hypertension I am not sure if the latter have any wound care healing issues I would need to investigate this. 06/23/15 continues with a gelatinous surface eschar with of fibrinous underlying.  What I can see of this wound does not look unhealthy however I just can't get this material which I think is 2 different layers off. Empiric culture done 06/30/15; unfortunately not a lot of change in this wound. A gelatinous surface eschar is easily removed however it has a tight fibrinous surface underneath the. 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 The wound comes back and basically and unchanged state the. She has a gelatinous surface that is more easily removed however there is a tightly fibrinous surface underneath the. There is no evidence of infection. She has a vascular follow-up next month. I would have to inject her in order to do a more aggressive debridement of this area 07/21/15 the wound is roughly in the same state albeit the debridement is done with greater ease. There is less of the  fibrinous eschar underneath the. There is no evidence of infection. She has follow-up with vascular surgery next week. No evidence of surrounding infection. Her original distal wounds healed while this one formed. 07/28/15; wound is easier to debride. No wound erythema. She is seeing Dr. [name] next week. 08/18/15 Has been to [place] for repeat ablation. Have been using medihoney pad with some improvement 08/25/15; absolutely no change in the condition of this wound in either its overall size or surface condition in many months now. At one point I had this down to [place] healthier surface I think with [facility] however this did not actually progress towards closure. Do not believe that the wound has actually deteriorated in terms of volume at all. We have been using a medihoney pad which allows easier debridement of the gelatinous eschar but again no overall actual improvement. the patient is going towards an ablation with pain and vascular which I think is scheduled for next week. The only other investigation that I could first see would be a biopsy. She does have underlying scleroderma 09/02/15 eschar is much easier to debridement however the base of this does not look particularly vibrant. We changed to Iodoflex. The patient had her ablation earlier this week 09/09/15 again the debridement over the base of this wound is easier and the base of the wound looks considerably better. We will continue the Iodoflex. Dr. Tawni Millers has expressed his satisfaction with the result of her ablation 09/15/15 once again the wound is relatively free of surface eschar. No debridement was done today. It has a pale-looking base to it. although this is not as deep as it once was it seems to be expanding especially inferiorly. She has had recent venous ablation but this is no closer to healing.I've gone ahead and done a punch biopsy this from the inferior part of the wound close to normal skin 09/22/15: the wound is  relatively free of surface eschar. There is some surrounding eschar. I'm not exactly sure at what level the surface is that I am seeing. 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 No evidence of atypical infection, malignancy. 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 No debridement was done 09/29/15; the wound surface is relatively free of surface eschar. There is eschar surrounding the walls of the wound. Aside from the improvement in the amount of surface slough. This wound has not progressed any towards closure. There is not even a surface that looks like there at this is ready. There is no evidence of any infection nor maligancy based on biopsy I did on 9/15. I continued with  the Iodoflex however I am looking towards some alternative to try to promote some closure or filling in of this surface. Consider triple layer Oasis. Collagen did not result in adequate control of the surface slough 10/13/15; the patient was in hospital last week with severe anemia. The wound looks somewhat better after debridement although there is widening medially. There is no evidence of infection. 10/20/15; patient's wound on the right lateral lower leg is essentially unchanged. This underwent a light surface debridement and in general the debridement is easier and the surface looks improved. I noted in doing this on the side of the wound what appears to be a piece of calcium deposition. The patient noted that she had previous things on the tips of her fingers. 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 She follows with rheumatology and I have asked them to talk to her about this. In view of that S the nonhealing ulcer may have something to do with calcinosis and also unrelieved Raynaud's disease in this area. I should note that her original wounds on the right and left medial malleolus and  the inner aspects of both legs just below the knees did however heal over 10/30/15; the patient's wound on the right lateral lower leg is essentially unchanged. I was able to remove some calcified material from the medial wound edge. 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 Otherwise the wound appears essentially unchanged there is less adherent surface eschar. Some of the calcified material was sent to pathology for analysis 11/17/15. The patient's wound on the right lateral leg is essentially unchanged. Wider Medially. 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 There is some surface slough but I did not debridement this. No evidence of surrounding infection. 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 This will make healing difficult 12/15/15; the wound has less depth surface has a fibrinous slough and calcifications in the wound edge no evidence of infection 12/22/15; the wound definitely has less Fibrinous slough on the base. Calcifications around the wound edges are still evident. Although the wound bed looks healthier it is still pale in appearance. Previous biopsy did not show malignancy 01/04/15; surgical debridement of nonviable slough and subcutaneous tissue the wound cleans up quite nicely but appears to be expanding outward calcifications around the wound edges are still there. 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 She is to see her rheumatologist I'll see if he has any opinions on this. My punch biopsy done in September did not show calcifications although these are clearly evident. 01/19/16 light selective  debridement of nonviable surface slough. There is epithelialization medially. This gives me reason for cautious optimism. She has been to see her rheumatologist, there is nothing that can be done for this type of soft tissue calcification associated with scleroderma 02/02/16 no debridement although there is a light surface slough. She has 2 peninsulas of skin 1 inferiorly and one medially. We continue to make a slight and slow but definite progressive here 02/16/16; light surface debridement with more attention to the circumference of the wound bed where the fibrinous eschar is more prevalent. No calcifications detected.  She seems to have done nicely with the [facility] with some epithelialization and some improvement in the overall wound volume. 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 Continued attention to the circumference of the wound where the fibrinous eschar in calcinosis or prevalent. No calcifications were detected. She has continued improvement with Hydrofera Blue. The wound is no longer as deep 03/15/16 surgical debridement done to remove surface escha Especially around the circumference of the wound where there is nonviable subcutaneous tissue. In spite of this there is considerable improvement in the overall dimensions and depth of the wound. Islands of epithelialization are seen especially medially inferiorly and superiorly to a lesser extent. She is using Hydrofera Blue at home 03/29/16; surgical debridement done to remove surface eschar and nonviable subcutaneous tissue. This cleans up quite nicely mention slightly larger in terms of length and width however depth is less 04/12/16; continued gradual improvement in terms of depth and the condition of the wound base. Debridement is done. Continuing long standing Hydrofera Blue at home with Kerlix Coban  wraps 04/26/16; continued gradual improvement in terms of depth and management as well as condition of the wound base. Surgical debridement done she continues with Hydrofera Blue. 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 She initially came to this clinic venous insufficiency ulcers which have long since healed 05/17/16 continued improvement in terms of the depth and measurements of this wound although she has a tightly adherent fibrinous slough each time. We've been continued with long standing Hydrofera Blue which seems to done as well for this wound is many advanced treatment options. 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 She also has chronic venous insufficiency. She has an irritation on her lateral right ankle secondary to our wraps 05/10/16; wound appears to be smaller especially on the medial aspect and especially in the width. Wound was debridement surface looks better. She is also been in the hospital apparently with anemia again she tells me she had an endoscopy. Since she got home after 3 days which I believe was sometime last week she has had an irritated painful area on the right lateral ankle surrounding the lateral malleolus 05/31/16; much more adherent surface slough today then recently although I don't think the dimensions of changed that much. A more aggressive debridement is required. The irritated area over her medial malleolus is more pruritic and painful and I don't think represents cellulitis 06/14/16; no major change in her wound dimensions however there is more tightly adherent surface slough which is disappointing. As well as she appears to have a new small area medially. Furthermore an irritated uncomfortable area on the lateral aspect of her right foot just below the lateral malleolus. 06/21/16; I'm seeing the patient back in follow-up for the new areas under her major wound on the right anterior  leg. She has been using Hydrofera Blue to this area probably for several months now and although the dressings seem to be helping for quite a period of time I think things have stagnated lately. She comes in today with a relatively tight adherent surface slough and really no changes in the wound shape or dimension. The 2 small areas she had inferiorly are tiny but still open they seem improved this well. There is no uncontrolled edema and I don't think there is any evidence of cellulitis.  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 Patient has had vascular evaluations both venous and arterial. I have biopsied this area. There is no obvious infection. The worrisome thing today is that she seems to be developing areas of erythema and epithelial damage on the medial aspect of the right foot. Also to a lesser degree inferior to the actual wound itself. Again I see no obvious changes to suggest cellulitis however as this is the only treatable option I will probably give her antibiotics. 07/13/16 no major change in the lady's large anterior right leg wound. Still covered with a very tightly adherent surface slough which is difficult to debridement. There is less erythema around this, culture last week grew pseudomonas I gave her ciprofloxacin. The area on her lateral right malleolus looks better- 08/02/16 the patient's wounds continued to decline. Her original large anterior right leg wound looks deeper. Still adherent surface slough that is difficult to debridement. She has a small area just below this and a punched-out wound just below her lateral malleolus. In the meantime she is been in hospital with apparently an upper GI bleed on Plavix and aspirin. She is now just on Plavix she received 3 units of packed cells 08/23/16; since I last saw this 3 weeks ago, the open large area on her right leg looks  about the same syrup. She has a small satellite lesion just underneath this. The area on her medial right ankle is now a deep necrotic wound. I attempted to debridement this however there is just too much pain. 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 She follows with vascular surgery and is scheduled for an angiogram in early September 09/06/16; the patient is going for an arteriogram tomorrow. Her original large wound on the right calf is about the same the satellite lesion underneath it is about the same however the area on her medial ankle is now deeper with exposed tendon. I am no longer attempting to debride these wounds 09/20/16; the patient has undergone a right femoral endarterectomy and Dacron patch angioplasty. This seems to have helped the flow in her right leg. 10/04/16; Arrived today for aggressive debridement of the wounds on her right calf the original wound the one beneath it and a difficult area over her right lateral leg just above the lateral malleolus. 10/25/16; her 3 open wounds are about the same in terms of dimensions however the surface appears a lot healthier post debridement. Using Iodoflex 10/18/16 we have been using Iodoflex to her wounds which she tolerated with some difficulty. 10/11/16; has been using Santyl for a period of time with some improvement although again very adherent surface slough would prevent any attempted healing this. She has a original wound on the left calf, the satellite underneath that and the most recent wound on the right medial ankle. She has completed revascularization by Dr. early and has had venous ablation earlier. Want to go back to Iodoflex to see if week and get a healthier surface to this wound bed failing this I think she'll need to be taken to the OR and I am prepared to call Dr. Marla Roe to discuss this. She is obviously not a good candidate for general anesthesia  however.; 11/08/16; I put her on Iodoflex last time to see if I can get the wound bed any  healthier and unfortunately today still had tremendous surface slough. 11/15/16; 4 weeks' worth of Iodoflex with not much improvement. Debridement on the major wounds on her left anterior leg is easier however this does not maintain from week to week. The punched-out area on her medial right ankle 11/29/16; I attempted to change the patient last visit to [facility] however she states this burned and was very uncomfortable therefore we gave her permission to go back to the eye out of complex which she already had at home. Also she noted a lot of pain and swelling on the lateral aspect of her leg before she traveled to [facility] for the holidays, I called her in doxycycline over the phone. This seems to have helped 12/06/16; Wounds unchanged by in large. Using Iodoflex 12/13/16; her wounds today actually looks somewhat better. The area on the right lateral lower leg has reasonably healthy-looking granulation and perhaps as actually filled and a bit. Debridement of the 2 wounds on the medial calf is easier and post debridement appears to have a healthier base. We have referred her to Dr. Migdalia Dk for consideration of operative debridement 12/20/16; we have a quick note from Dr. Merri Ray who feels that the patient needs to be referred to an academic center/plastic surgeon. This is due to the complexity of the patient's medical issues as it applies to anything in the OR. We have been using Iodoflex 12/27/16; in general the wounds on the right leg are better in terms of the difficult to remove surface slough. She has been using Iodoflex. She is approved for Apligraf which I anticipated ordering in the next week or 2 when we get a better-looking surface 01/04/16 the deep wounds on the right leg generally look better. Both of them are debrided further surface slough. The area on the lateral right  leg was not debrided. She is approved for Apligraf I think I'll probably order this either next week or the week after depending on the surface of the wounds superiorly. We have been using Iodoflex which will continue until then 01/11/16; the deep wounds on the right leg again have a surface slough that requires debridement. I've not been able to get the wound bed on either one of these wounds down to what would be acceptable for an advanced skin stab-like Apligraf. The area on the medial leg has been improving. We have been using hot Iodoflex to all wounds which seems to do the best at at least limiting the nonviable surface 01/24/17; we have continued Iodoflex and all her wound areas. Her debridement Gen. he is easier and the surface underneath this looks viable. Nevertheless these are large area wounds with exposed muscle at least on the anterior parts. We have ordered Apligraf's for 2 weeks from now. The patient will be away next week 02/07/17; the patient was close to have first Apligraf today however we did not order one. I therefore replaced her Iodoflex. She essentially has 3 large punched- out areas on her right anterior leg and right medial ankle. 02/11/17; Apligraf #1 02/25/17; Apligraf #2. In general some improvement in the right medial ankle and right anterior leg wounds. The larger wound medially perhaps some better 03/11/17; Apligraf #3. In general continued improvement in the right medial ankle and the right anterior leg wounds. 03/25/17 Apligraf #4. In general continued improvement especially on the right medial ankle and the lower 04/08/17; Apligraf #5 in general continued improvement in all wound sites. 04/22/17; post Apligraf #5 her wound beds  continued to look a lot better all of them up to the surface of the surrounding skin. Had a caramel-colored slough that I did not debrided in case there is residual Apligraf effect. The wounds are as good as I have seen these looking quite some  time. 04/29/17; we applied [place] and last week after completing her Apligraf. Wounds look as though they've contracted somewhat although they have a nonviable surface which was problematic in the past. Apparently she has been approved for further Apligraf's. We applied Iodosorb today after debridement. 05/06/17; we're fortunate enough today to be able to apply additional Apligraf approved by her insurance. In general all of her wounds look better Apligraf #6 05/24/17; Apligraf #7 continued improvement in all wounds o3 06/10/17 Apligraf #8. Continued improvement in the surface of all wounds. Not much of an improvement in dimensions as I might a follow 06/24/17 Apligraf #9 continued general improvement although not as much change in the wound areas I might of like. She has a new open area on the right anterior lateral ankle very small and superficial. She also has a necrotic wound on the tip of her right index finger probably secondary to severe uncontrolled Raynaud's phenomenon. She is already on sildenafil and already seen her rheumatologist who gave her Keflex. 07/08/17; Apligraf #10. Generalized improvement although she has a small additional wound just medial to the major wound area. 07/22/17; after discussion we decided not to reorder any further Apligraf's although there is been considerable improvement with these it hasn't been recently. The major wound anteriorly looks better. Smaller wounds beneath this and the more recent one and laterally look about the same. The area on the right lateral lower leg looks about the same 07/29/17 this is a patient who is exceedingly complex. She has advanced scleroderma, crest syndrome including calcinosis, PAD status post revascularization, chronic venous insufficiency status post ablations. She initially presented to this clinic with wounds on her bilateral lower legs however these closed. More recently we have been dealing with a large open area superiorly  on the right anterior leg, a smaller wound underneath this and then another one on the right medial lower extremity. These improve significantly with 10 Apligraf applications. Over the last 2-3 weeks we are making good progress with Hydrofera Blue and these seem to be making progress towards closure 08/19/17; wounds continued to make good improvement with Hydrofera Blue and episodic aggressive debridements 08/26/17; still using Hydrofera Blue. Good improvements 09/09/17; using Hydrofera Blue continued improvement. Area on the lateral part of her right leg has only a small remaining open area. The small inferior satellite region is for all intents and purposes closed. Her major wound also is come in in terms of depth and has advancing epithelialization. 09/16/17; using Hydrofera Blue with continued improvement. The smaller satellite wound. We've closed out today along with a new were satellite wound medially. The area on her medial ankle is still open and her major wound is still open but making improvement. All using Hydrofera Blue. Currently 09/30/17; using Hydrofera Blue. Still a small open area on the lateral right ankle area and her original major wound seems to be making gradual and steady improvement. 10/14/17; still using Hydrofera Blue. Still too small open areas on the right lateral ankle. Her original major wound is horizontal and linear. The most problematic area paradoxically seems to be the area to the medial area wears I thought it would be the lateral. The patient is going for amputation of her gangrenous  fingertip on the right fourth finger. 10/28/17; still using Hydrofera Blue. Right lateral ankle has a very small open area superiorly on the most lateral part of the wound. Her original open wound has 2 open areas now separated by normal skin and we've redefined this. 11/11/17; still using Hydrofera Blue area and right lateral ankle continues to have a small open area on mostly the lateral  part of the wound. Her original wound has 2 small open areas now separated by a considerable amount of normal skin 11/28/17; the patient called in slightly before Thanksgiving to report pain and erythema above the wound on the right leg. In the past this is responded well to treatment for cellulitis and I gave her over the phone doxycycline. She stated this resulted in fairly abrupt improvement. We have been using Hydrofera Blue for a prolonged period of time to the larger wound anteriorly into the remaining wound on her right right lateral ankle. The latter is just about closed with only a small linear area and the bottom of the [place]. 12/02/17; use endoform and left the dressing on since last visit. There is no tenderness and no evidence of infection. 12/16/17; patient has been using Endoform but not making much progress. The 2 punched out open areas anteriorly which were the reminiscence of her major wound appear deeper. The area on the lateral aspect of her right calf also appears deeper. Also she has a puzzling tender swelling above her wound on the right leg. This seems larger than last time. Just above her wounds there appears to be some fluctuance in this area it is not erythematous and there is no crepitus 12/30/17; patient has been using Endoform up until last week we used Hydrofera Blue. Ultrasound of the swelling above her 2 major wounds last time was negative for a fluid collection. I gave her cefaclor for the erythema and tenderness in this area which seems better. Unfortunately both punched out areas anteriorly and the area on her right medial lower leg appear deeper. In fact the lateral of the wounds anteriorly actually looks as though it has exposed tendon and/or muscle sheath. She is not systemically unwell. She is complaining of vaginitis type symptoms presumably Candida from her antibiotics. 01/06/18; we're using santyl. she has 2 punched out areas anteriorly which were initially part  of a large wound. Unfortunately medially this is now open to tendon/muscle. All the wounds have the same adherent very difficult to debride surface. 01/20/18; 2 week follow-up using Santyl. She has the 2 punched out areas anteriorly which were initially part of her large surface wound there. Medially this still has exposed muscle. All of these have the same tightly adherent necrotic surface which requires debridement. PuraPly was not accepted by the patient's insurance however her insurance I think it changed therefore we are going to run Apligraf to gain 02/03/18; the patient has been using Santyl. The wound on the right lateral ankle looks improved and the 2 areas anteriorly on the right leg looks about the same. The medial one has exposed muscle. The lateral 1 requiresdebridement. We use PuraPly today for the first week 02/10/18; PuraPly #2. The patient has 3 wounds. The area on the right lateral ankle, 2 areas anteriorly that were part of her original large wound in this area the medial one has exposed muscle. All of the wounds were lightly debrided with a number 3 curet. PuraPly #2 applied the lateral wound on the calf and the right lateral ankle look better.  02/17/18; PuraPly #3. Patient has 3 wounds. The area on the right lateral ankle in 2 areas internally that were part of her original large wound. The lateral area has exposed muscle. She arrived with some complaints of pain around the right ankle. 02/24/18; PuraPly #4; not much change in any of the 3 wound areas. Right lateral ankle, right lateral calf. Both of these required debridement with a #3 curet. She tolerates this marginally. The area on the medial leg still has exposed muscle. Not much change in dimensions 03/03/18 PuraPly #5. The area on the medial ankle actually looks better however the 2 separate areas that were original parts of the larger right anterior leg wound look as though they're attempting to coalesce. 03/10/18; PuraPly #6. The  area on the medial ankle actually continues to look and measures smaller however the 2 separate areas that were part of the original large wound on the right anterior leg have now coalesced. There hasn't been much improvement here. The lateral area actually has underlying exposed muscle 03/17/18-she is here in follow-up evaluation for ulcerations to the right lower extremity. She is voicing no complaints or concerns. She tolerated debridement. Puraply#7 placed 03/24/18; difficult right lower extremity ulcerations. PuraPly #8 place. She is been approved for [employer]. She did very well with Apligraf today however she is apparently reached her "lifetime max" 03/31/18; marginal improvement with PuraPly although her wounds looked as good as they have in several weeks today. Used TheraSkins #1 04/14/18 TheraSkin #2 today 04/28/18 TheraSkins #3. Wound slightly improved 05/12/18; TheraSkin skin #4. Wound response has been variable. 05/27/18 TheraSkin #5. Generally improvement in all wound areas. I've also put her in 3 layer compression to help with the severe venous hypertension 06/09/18; patient is done quite well with the TheraSkins unfortunately we have no further applications. I also put her in 3 layer compression last week and that really seems to of helped. 06/16/18; we have been using silver collagen. Wounds are smaller. Still be open area to the muscle layer of her calf however even that is contracted somewhat. She tells me that at night sometimes she has pain on the right lateral calf at the site of her lower wound. Notable that I put her into 3 layer compression about 3 weeks ago. She states that she dangles her leg over the bed that makes it feel better but she does not describe claudication during the day ooShe is going to call her secondary insurance to see if they will continue to cover advanced treatment products I have reviewed her arterial studies from 01/22/17; this showed an ABI in the right of 1  and on the left noncompressible. TBI on the right at 0.30 on the left at 0.34. It is therefore possible she has significant PAD with medial calcification falsely elevating her ABI into the normal range. I'll need to be careful about asking her about this next week it's possible the 3 layer compression is too much 06/23/18; was able to reapply TheraSkin 1 today. Edema control is good and she is not complaining of pain no claudication 07/07/18;no major change. New wound which was apparently a taper removal injury today in our clinic between her 2 wounds on the right calf TheraSkins #2 07/14/18; I think there is some improvement in the right lateral ankle and the medial part of her wound. There is still exposed muscle medially. 07/28/18; two-week follow-up. TheraSkins#3. Unfortunately no major change. 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 She seems to be doing well and in the base of the wound appears to show some progress at this point. This is the last approved [employer] of the second set. 08/25/18; she has completed TheraSkin. There has been some improvement on the right lateral calf wound as well as the anterior leg wounds. The open area to muscle medially on the anterior leg wound is smaller. I'm going to transition her back to [facility] under Kerlix Coban change every second day. She reports that she had some calcification removed from her right upper arm. We have had previous problems with calcifications in her wounds on her legs but that has not happened recently 09/08/18;using Hydrofera Blue on both her wound areas. Wounds seem to of contracted nicely. She uses Kerlix Coban wrap and changes at home herself 09/22/2018; using Hydrofera Blue on both her wound areas. Dimensions seem to have come down  somewhat. There is certainly less depth in the medial part of the mid tibia wound and I do not think there is any exposed muscle at this point. 10/06/2018; 2-week follow-up. Using [facility] on her wound areas. Dimensions have come down nicely both on the right lateral ankle area in the right mid tibial area. She has no new complaints 10/20/2018; 2-week follow-up. She is using Hydrofera Blue. Not much change from the last time she was here. The area on the lateral ankle has less depth although it has raised edges on one side. I attempted to remove as much of the raised edge as I could without creating more additional wounds. The area on the right anterior mid tibia area looks the same. 11/03/2018; 2-week follow-up she is using Hydrofera Blue. On the right anterior leg she now has 2 wounds separated by a large area of normal skin. The area on the medial part still has I think exposed muscle although this area itself is a lot smaller. The area laterally has some depth. Both areas with necrotic debris. ooThe area on her right lateral ankle has come in nicely 11/17/2018; patient continues to use Hydrofera Blue. We have been increasing separation of the 2 wounds anteriorly which were at one point joint the area on the right lateral calf continues to have I think some improvement in depth. 12/08/2018; patient continues to use Hydrofera Blue. There is some improvement in the area on the right lateral calf. The 2 areas that were initially part of the original large wound in the mid right tibia are probably about the same. In fact the medial area is probably somewhat larger. We will run puraply through the patient's insurance 12/22/2018; she has been using Hydrofera Blue. We have small wounds on the right lateral calf and 2 small areas that were initially part of a large wound in the right mid tibia. We applied pure apply #1 today. 12/29/2018; we applied puraply #2. Her wounds look somewhat better  especially on the right lateral calf and the lateral part of her original wound in the mid tibial area. 01/05/2019; perhaps slightly improved in terms of wound bed condition but certainly not as much improvement as I might of liked. Puraply #3 1/13: we did not have a correct sized puraply to apply. wounds more pinched out looking, I increased her compression to 3 layer last week to help with significant multilevel venous hypertension. Since then I've reviewed her  arterial status. She has a right femoral endarterectomy and a distal left SFA stent. She was being followed by Dr. [name] for a period however she does not appear to have seen him in 3 years. I will set up an appointment. 1/20. The patient has an additional wound on the right lateral calf between the distal wound and proximal wounds. We did not have Puraply last week. Still does not have a follow-up with Dr. early 1/27: Follow-up with Dr. early has been arranged apparently with follow-up noninvasive studies. Wounds are measuring roughly the same although they certainly look smaller 2/3; the patient had non invasive studies. Her ABIs on the right were 0.83 and on the left 1.02 however there was no great toe pressure bilaterally. Also worrisome monophasic waveforms at the PTA and dorsalis pedis. We are still using Puraply. We have had some improvement in all of the wounds especially the lateral part of the mid tibial area. 2/10; sees Dr. early of vein and vascular re-arterial studies next week. Puraply reapplied today. 2/17; Dr. early of vein and vascular his appointment is tomorrow. Puraply reapplied after debridement of all wounds 2/24; the patient saw Dr. early I reviewed his note. sHe noted the previous right femoral endarterectomy with a Dacron patch. He also noted the normal ABI and the monophasic waveforms suggesting tibial disease. Overall he did not feel that she had any evidence of arterial insufficiency that would impair her wound  healing. He did note her venous disease as well. He suggested PRN follow-up. 3/2; I had the last puraply applied today. The original wounds over the mid tibia area are improved where is the area on the right lower leg is not 3/9; wounds are smaller especially in the right mid tibia perhaps slightly in the right lateral calf. We finished with puraply and went to endoform today 3/23; the patient arrives after 2 weeks. She has been using endoform. I think all of her wounds look slightly better which includes the area on the right lateral calf just above the right lateral malleolus and the 2 in the right mid tibia which were initially part of the same wound. 4/27; TELEHEALTH visit; the patient was seen for telehealth visit today with her consent in the middle of the worldwide epidemic. Since she was last here she called in for antibiotics with pain and tenderness around the area on the right medial ankle. I gave her empiric doxycycline. She states this feels better. She is using endoform on both of these areas 5/11 TELEHEALTH; the patient was seen for telehealth visit today. She was accompanied at home by her husband. She has severe pulmonary hypertension accompanied scleroderma and in the face of the Covid epidemic cannot be safely brought into our clinic. We have been using endoform on her wound areas. There are essentially 3 wound areas now in the left mid tibia now 2 open areas that it one point were connected and one on the right lateral ankle just above the malleolus. 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 She cannot come to clinic. The wounds were reviewed today via telehealth. She has severe chronic venous hypertension which I think is centrally mediated. She has wounds on her right anterior tibia and right lateral  ankle area. These are chronic. She has been using endoform. 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 We have been following her from telehealth. She has severe chronic venous hypertension which may be mostly centrally mediated secondary to right heart heart failure. She has wounds on her right anterior tibia and right lateral ankle these are chronic we have been using endoform 6/22; TELEHEALTH; this patient was seen today via telehealth. 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 Since the last time we had contact with this patient she developed some pain and erythema around the wound on her right lateral malleolus/ankle and we put in antibiotics for her. This is resulted in good improvement with resolution of the erythema and tenderness. I changed her to silver alginate last time. We had been using endoform for an extended period of time 7/13; TELEHEALTH; this patient was seen today via telehealth. 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 She would continue to be at a prohibitive risk to be brought into our clinic unless this was absolutely necessary. These visits have been done with her approval as well as her husband. We have been using silver alginate to the areas on the right mid tibia and right lateral lower leg. 7/27 TELEHEALTH; patient was seen along with her husband today via telehealth. 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 We changed her to sample last visit. She has 2 areas a chronic wound on her right mid tibia and one just above her ankle. These were not the original wounds when she came into this clinic but she developed them during  treatment 8/17; she comes in for her first face-to-face visit in a long period. She has a remaining area just medial to the right tibia which is the last open part of her large wound across this area. She also has an area on the right lateral lower leg. We prescribed Santyl last telehealth visit but they were concerned that this was making a deeper so they put silver alginate on it last week. Her husband changes the dressings. 8/31; using Santyl to the 2 wound areas some improvement in wound surfaces. Husband has surgery in 2 weeks we will put her out 3 weeks. Any of the advanced treatment options that I can think of that would be eligible for this wound would also cause her to have to come in weekly. The risk that the patient is just too high 9/21. Using Santyl to the 2 wound areas. Both of these are somewhat better although the medial mid tibia area still has exposed muscle. Lateral ankle requiring debridement. Using Santyl 10/12; using Santyl to the 2 wound areas. One on the right lateral ankle and the other in the medial calf which still has exposed muscle. Both areas have come down in size and have better looking surfaces. She has made nice progress with santyl 11/2; we have been using Santyl to the 2 wound areas. Right lateral ankle and the other in the mid tibia area the medial part of this still has exposed muscle. 11/23/2019 on evaluation today patient appears to be doing about the same really with regard to her wounds. She is actually not very pleased with how things seem to be progressing at this point she tells me that she really has not  noted much improvement unfortunately. With that being said there is no signs of active infection at this time. There is some slough buildup noted at this time which again along with some dry skin around the edges of the wound I think would benefit her to try to debride some of this away. Fortunately her pain is doing fairly well. She still has exposed  muscle in the right medial/tibial area. 12/14; TELEHEALTH; she was changed to [facility] to the right calf wound and right lateral ankle wound when she was here last time. Unfortunately since then she had a fall with a pelvic fracture and a fracture of her wrist. She was apparently hospitalized for 5 days. I have not looked at her discharge summary. She apparently came out of the hospital with a blister on her right heel. She was seen today via telehealth by myself and our case manager. The patient and her husband were present. She has been using Hydrofera Blue at our direction from the last time she was in the clinic. There is been no major improvement in fact the areas appear deeper and with a less viable surface 01/04/2020; TELEHEALTH; the patient was seen today in accompaniment of her husband and our nurse. She has 3 open areas 1 on the right medial mid tibia, one on the right lateral ankle and a large eschared pressure area on the right heel. We have been using Iodoflex to the 2 original wounds. 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 It is simply too perilous for her to be seen in any other way 1/26; TELEHEALTH; the patient was seen today in accompaniment of her husband and our RN one of our nurses. She still has the 3 open areas 1 on the right medial mid tibia which is the remanent of a more extensive wound in this area, one on the right lateral ankle and a large eschared pressure area on the right heel. We have been using Iodoflex to the 2 original wounds and a bed at 9 application to the eschared area on the heel 2/15; the first time we have seen this patient and then several months out of concern for the pandemic. She had a large horizontal wound in the mid tibia. Only the medial aspect of this is still open. Area on the lateral ankle is just about closed. She had a new pressure ulcer on the right heel which I have removed some of the eschar. We have been  using Iodoflex which I will continue. The area in the mid tibia has a round circle in the middle of exposed muscle. I think we would have to use an advanced treatment product to stimulate granulation over this area. We will run this through her insurance. She is not eligible for plastic surgery for many reasons 3/1; TELEHEALTH. The patient was seen today by telehealth. 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 We have been using Iodoflex to the wound areas which include the right anterior mid tibia, right lateral ankle and the right heel. All of these were reviewed 3/8; the patient's wound just above the right ankle is closed. She still has a contracting black eschar on the heel where she had a pressure ulcer. The medial part of her original wound on the mid tibia has exposed muscle. We have made really made no progress in this area although we have managed to get a lot of  the original wound in this area to close. We used Apligraf #1 today 3/22; the patient's ankle wound remains closed. She still has a contracting black eschar on the heel although it seems to have less surface area. It still not clear whether there is any depth here. We used Apligraf #2 today in an attempt to get granulation over the exposed muscle and what is left of her mid tibia area. 4/5; everything is closed except the medial aspect of the mid tibia wound, as well as the pressure injury on the right heel. She has been using Betadine to the right heel which has been gradually contracting. We used Apligraf #3 today. 4/19; Apligraf #4. Still a pressure area on the right heel she has been using Betadine superficial excoriated areas she has been applying salicylic acid to on the anterior leg and the thick horn on the leg just above her wound area 5/3; Apligraf #5. Unfortunately she came in today with a reopening of the area on the right  lateral lower leg. Not much change in the wound we have been treating in the mid calf. Quite a bit of edema surrounding this wound. She reports that she was up on her feet quite a bit. The area on the right heel is separating she has been using Betadine She comes in with a new wound on the left lateral malleolus which is very disappointing this is been open since last week 5/17; we applied her last Apligraf last time. Unfortunately that did not have any effect on the deep area medially in the mid tibial area. However the area over the right lateral malleolus is a lot better. Right heel also is contracted we used Iodoflex last time. The new wound on the left lateral malleolus were using Santyl to. This required debridement. 6/1. Not much change in the area on the right medial mid tibia. Right lateral ankle is improved she has the necrotic area on the right heel which was a pressure area. New area on the left lateral malleolus from last time. I changed her to Iodoflex to the area on the left lateral malleolus she said this hurt I have put her back on Santyl 6/15; right mid tibia is unchanged. I do not think there is anything I can do to this topically that we will get this to granulate over the muscle. And I am furthermore I am not sure that she is a surgical candidate either because of her severe pulmonary issues or because she really does not want to go through it. ooThe area on the right lateral malleolus is a small punched out area. ooThe area on the left lateral malleolus again small punched out with nonviable surface ooPressure ulcer on the right heel at separating black eschar. I went ahead and remove this today Lura Em has an area on the left anterior mid tibia just laterally. Geographic wounds debris on the surface. Some erythema here. 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 There is skin changes widely Change the primary dressing to Sorbact to all wound areas. She  needs compression on the left leg as well as the right 6/21; right mid tibia which was her one remaining wound at 1 point is unchanged ooThe area on the right lateral malleolus actually is close to closing over still a small punched out area ooThe area on the left lateral malleolus again a deep wound it is this 1 that she feels pain in. ooPressure ulcer on the right heel was cultured  today. ooNew area on the left anterior mid tibia several geographic wounds last week in the setting of chronic venous insufficiency this actually looks some better 7/6; ooRight mid tibia about the same. Exposed muscle ooLeft lateral malleolus again necrotic debris over the surface. ooPressure ulcer on the right heel. She finished the Keflex we prescribed. Necrotic debris debrided with a #3 curette ooThe rest of her wounds on the right mid tibia right lateral ankle are better Her husband reminds me that she had a right femoral endarterectomy and has 2 stents in her left thigh placed in 2011 approximately by vascular surgery in [place]. She saw Dr. [name] last in February 2020. At that point he did not think that the arterial insufficiency she had on the right was sufficient to explain any of her right lower extremity wounds however she now has an area on the left lateral malleolus that looks like an ischemic wound 7/12; we have been using Sorbact all wound areas ooRight mid tibia about the same exposed muscle ooLeft lateral malleolus small wound with debris in the surface punched out ooPressure ulcer of the right heel requiring debridement of necrotic debris ooLateral ankle wound on the right is just about closed ooRight mid tibia wound about the same still with exposed muscle. 7/26 really no change in any wounds. We have been using Sorbact. She has bilateral lower extremity wounds. This is in the setting of scleroderma, severe venous hypertension. She also has known PAD and I had really hoped to be  able to get a review by Dr. [name] but apparently that is not booked until August 31. I have asked her to call back to vein and vascular and get an appointment with somebody a little earlier if that is possible. 8/16; patient's area on the right ankle which was a chronic wound is closed over again. The area on the right mid lower leg, right heel left lateral ankle are all about the same. Pressure ulcer on the right heel is still not viable. New areas identified on the right lateral calf. She has a follow-up with Dr. [name] on 8/31. Phenomenally I would like him to go over her arterial status with regards to her underlying stents. 9/13; patient had her ARTERIAL studies done however apparently Dr. [name] is now in [place] so she did not get an appointment with him on 8/31 and was not referred to any other provider. On the right side her PTA was monophasic. Noncompressible. TBI on the right at 0.41 on the left she was monophasic and noncompressible they did not do a TBI because she had a Band-Aid on her big toe. Patient does not describe claudication although she is having a lot of pain in the left lateral malleolus. 10/11; the patient had a right-sided interventions on 10/5. She had a angioplasty of the right anterior tibial artery as well as the tibioperoneal trunk/peroneal artery she had a drug-coated balloon angioplasty of the right superficial femoral artery. On the left she did not have any interventions yet but is going to have another look at this in 2 weeks. The superficial femoral artery on the left and the associated stent are patent popliteal artery is patent to the dominant vessel runoff is the anterior tibial which has several high-grade stenosis in the proximal one third. We have been using Sorbact. She also has a new area on the right upper mid tibia. 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 She tolerated this well. She is using Iodoflex and her husband is doing a kerlix Coban on her. She arrives in clinic today with the original medial part of her mid tibia wound with muscle exposure small area on the right ankle which was part of her original wound she has 2 punched-out areas laterally and 1 above her area anteriorly. Nonviable surfaces similarly the right heel is nonviable. On the left side she has an area on the left foot and the left lateral ankle similar nonviable surfaces. 11/8; patient arrives in clinic today with nothing improved. She has a multiple lightly group of wounds on the right lower leg presumably different etiologies including a skin biopsy, the original wound she had medially and some punched-out areas that are not of clear defined etiology. She says she was in a lot of pain this week could not have her leg up on the bed did not get completely relief from dropping it over the bed. She also has a small area on the left lateral malleolus and the left lateral foot necrotic surfaces on all of these wounds. We have been using Iodoflex I attempted to put her in 3 layer compression last week that did not go well. We will be backing off on kerlix Coban this week Objective Constitutional Patient is hypertensive.. Pulse regular and within target range for patient.Marland Kitchen Respirations regular, non-labored and within target range.. Temperature is normal and within the target range for the patient.Marland Kitchen Appears in no distress. Vitals Time Taken: 12:59 PM, Height: 68 in, Weight: 132 lbs, BMI: 20.1, Temperature: 98.1 F, Pulse: 81 bpm, Respiratory Rate: 18 breaths/min, Blood Pressure: 190/70 mmHg. Respiratory work of breathing is normal. Cardiovascular As opposed to last week I could not feel any pulses in her foot either the  dorsalis pedis or the posterior tibial. Significant swelling and tenderness of the right leg warmth noted. These were not in relation to any of her wounds and not clearly infectious.. General Notes: Wound exam ooThe midportion of the right tibia part of her original wound not changed at all. On the right lateral she has about 2 or 3 punched- out areas 1 of which was a skin cancer biopsy site the other 2 are of unclear etiology. The right lateral ankle part of her original wound is small but again a nonviable surface of the right medial heel again all of these wounds covered in a very adherent necrotic surface. No debridement was done. ooShe has an area on the left lateral foot and left lateral ankle these are small punched out but nonviable surface Integumentary (Hair, Skin) Wound #16 status is Open. Original cause of wound was Pressure Injury. The wound is located on the Right Calcaneus. The wound measures 0.5cm length x 1.1cm width x 0.7cm depth; 0.432cm^2 area and 0.302cm^3 volume. There is Fat Layer (Subcutaneous Tissue) exposed. There is no tunneling or undermining noted. There is a small amount of serosanguineous drainage noted. The wound margin is flat and intact. There is small (1-33%) pink granulation within the wound bed. There is a large (67-100%) amount of necrotic tissue within the wound bed including Adherent Slough. Wound #19 status is Open. Original cause of wound was Gradually Appeared. The wound is located on the Right,Lateral Malleolus. The wound measures 0.4cm length x 0.3cm width x 0.2cm depth; 0.094cm^2 area  and 0.019cm^3 volume. There is Fat Layer (Subcutaneous Tissue) exposed. There is no tunneling or undermining noted. There is a small amount of serous drainage noted. The wound margin is flat and intact. There is no granulation within the wound bed. There is a large (67-100%) amount of necrotic tissue within the wound bed including Adherent Slough. Wound #20 status is Open.  Original cause of wound was Gradually Appeared. The wound is located on the Left,Lateral Malleolus. The wound measures 0.9cm length x 0.8cm width x 0.2cm depth; 0.565cm^2 area and 0.113cm^3 volume. There is Fat Layer (Subcutaneous Tissue) exposed. There is no tunneling or undermining noted. There is a small amount of serosanguineous drainage noted. The wound margin is well defined and not attached to the wound base. There is no granulation within the wound bed. There is a large (67-100%) amount of necrotic tissue within the wound bed including Adherent Slough. Wound #23 status is Open. Original cause of wound was Gradually Appeared. The wound is located on the Right,Posterior Lower Leg. The wound measures 1.9cm length x 2.8cm width x 0.2cm depth; 4.178cm^2 area and 0.836cm^3 volume. There is Fat Layer (Subcutaneous Tissue) exposed. There is no tunneling or undermining noted. There is a medium amount of serous drainage noted. The wound margin is indistinct and nonvisible. There is no granulation within the wound bed. There is a large (67-100%) amount of necrotic tissue within the wound bed including Adherent Slough. Wound #24 status is Open. Original cause of wound was Gradually Appeared. The wound is located on the Right,Anterior Lower Leg. The wound measures 1.5cm length x 0.9cm width x 0.3cm depth; 1.06cm^2 area and 0.318cm^3 volume. There is Fat Layer (Subcutaneous Tissue) exposed. There is no tunneling or undermining noted. There is a medium amount of serous drainage noted. The wound margin is distinct with the outline attached to the wound base. There is no granulation within the wound bed. There is a large (67-100%) amount of necrotic tissue within the wound bed including Adherent Slough. Wound #25 status is Open. Original cause of wound was Gradually Appeared. The wound is located on the Left,Lateral Foot. The wound measures 0.4cm length x 0.5cm width x 0.1cm depth; 0.157cm^2 area and 0.016cm^3  volume. There is Fat Layer (Subcutaneous Tissue) exposed. There is no tunneling or undermining noted. There is a small amount of serous drainage noted. The wound margin is distinct with the outline attached to the wound base. There is no granulation within the wound bed. There is a large (67-100%) amount of necrotic tissue within the wound bed including Adherent Slough. Wound #26 status is Open. Original cause of wound was Gradually Appeared. The wound is located on the Right,Lateral Lower Leg. The wound measures 1.1cm length x 0.8cm width x 0.3cm depth; 0.691cm^2 area and 0.207cm^3 volume. There is Fat Layer (Subcutaneous Tissue) exposed. There is no tunneling or undermining noted. There is a medium amount of serosanguineous drainage noted. The wound margin is flat and intact. There is no granulation within the wound bed. There is a large (67-100%) amount of necrotic tissue within the wound bed including Adherent Slough. Wound #27 status is Open. Original cause of wound was Gradually Appeared. The wound is located on the Right,Distal,Anterior Lower Leg. The wound measures 0.6cm length x 0.6cm width x 0.1cm depth; 0.283cm^2 area and 0.028cm^3 volume. There is Fat Layer (Subcutaneous Tissue) exposed. There is no tunneling or undermining noted. There is a small amount of serous drainage noted. The wound margin is flat and intact. There is no  granulation within the wound bed. There is a large (67-100%) amount of necrotic tissue within the wound bed including Adherent Slough. Wound #5 status is Open. Original cause of wound was Gradually Appeared. The wound is located on the Right,Medial Lower Leg. The wound measures 2cm length x 2.1cm width x 0.3cm depth; 3.299cm^2 area and 0.99cm^3 volume. There is Fat Layer (Subcutaneous Tissue) exposed. There is no tunneling or undermining noted. There is a medium amount of serosanguineous drainage noted. The wound margin is distinct with the outline attached to the  wound base. There is medium (34-66%) red, pink granulation within the wound bed. There is a medium (34-66%) amount of necrotic tissue within the wound bed including Adherent Slough. 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 Dressing Change Frequency: Change Dressing every other day. - all wounds Skin Barriers/Peri-Wound Care: Moisturizing lotion - both legs Wound Cleansing: May shower with protection. Primary Wound Dressing: Wound #16 Right Calcaneus: Iodoflex Wound #19 Right,Lateral Malleolus: Iodoflex Wound #20 Left,Lateral Malleolus: Iodoflex Wound #23 Right,Posterior Lower Leg: Iodoflex Wound #24 Right,Anterior Lower Leg: Iodoflex Wound #25 Left,Lateral Foot: Iodoflex Wound #26 Right,Lateral Lower Leg: Iodoflex Wound #27 Right,Distal,Anterior Lower Leg: Iodoflex Wound #5 Right,Medial Lower Leg: Iodoflex Secondary Dressing: Dry Gauze - all wounds Edema Control: Kerlix and Coban - Bilateral Avoid standing for long periods of time Elevate legs to the level of the heart or above for 30 minutes daily and/or when sitting, a frequency of: - throughout the day Off-Loading: Turn and reposition every 2 hours Other: - float heels off of bed/chair with pillow under calves Services and Therapies ordered  were: Venous Duplex Doppler, right leg, DVT rule out - Pain, swelling, and redness on right lower leg, rule out DVT 1. I am continue with Iodoflex to all wounds 2. DVT rule out on the right leg 3. I am also concerned about her vascular status to her foot and I would like Dr. Wendee Copp to see her back to Doppler the areas he angioplastied. 4. High put her back in kerlix Coban orders although were going to have to wait until after she has her duplex ultrasound. I will not moved to the 3 layer compression until we have a better sense of whether she has maintained her vascular supply Electronic Signature(s) Signed: 11/08/2020 3:11:24 PM By: Linton Ham MD Entered By: Linton Ham on 11/07/2020 14:34:07 -------------------------------------------------------------------------------- SuperBill Details Patient Name: Date of Service: [name] [name], Yolanda Manges. 11/07/2020 Medical Record Number: [id number] Patient Account Number: [id number] Date of Birth/Sex: Treating RN: 1951/03/27 (69 y.o. [name] Primary Care Provider: [name] Other Clinician: Referring Provider: Treating Provider/Extender: [name] in Treatment: 450 Diagnosis Coding ICD-10 Codes Code Description [phone] Non-pressure chronic ulcer of right calf with necrosis of muscle L97.811 Non-pressure chronic ulcer of other part of right lower leg limited to breakdown of skin I83.222 Varicose veins of left lower extremity  with both ulcer of calf and inflammation I87.331 Chronic venous hypertension (idiopathic) with ulcer and inflammation of right lower extremity L89.616 Pressure-induced deep tissue damage of right heel L97.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 I70.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 I70.248 Atherosclerosis of native arteries of left leg with ulceration of other part of lower leg Facility  Procedures CPT4 Code: [id number] Description: 67289 - WOUND CARE VISIT-LEV 5 EST PT Modifier: Quantity: 1 Physician Procedures : CPT4 Code Description Modifier [id number] - WC PHYS LEVEL 2 - EST PT ICD-10 Diagnosis Description P77.939 Non-pressure chronic ulcer of right calf with necrosis of muscle L97.811 Non-pressure chronic ulcer of other part of right lower leg limited to  breakdown of skin L97.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 Quantity: 1 Electronic Signature(s) Signed: 11/07/2020 6:16:40 PM By: Levan Hurst RN, BSN Signed: 11/08/2020 3:11:24 PM By: Linton Ham MD Entered By: Levan Hurst on 11/07/2020 17:57:35

## 2020-11-08 NOTE — Telephone Encounter (Signed)
Scheduled

## 2020-11-08 NOTE — Telephone Encounter (Signed)
Patient called to ask if we do blood work at the office. Directed her to call PCP per MD note. Patient verbalized understanding.

## 2020-11-08 NOTE — Telephone Encounter (Signed)
[name] [name] [phone]  [name] called to say she had a DVT and had been put on Xarelto along with her Plavix and that her Vein and Vascular doctor had ask her to call her PCP to see if she can come hear to have her hemoglobin checked every 2 weeks while she is on both medications. They do not do lab work at their office.

## 2020-11-08 NOTE — Progress Notes (Signed)
[name], [name] ([id number]) Visit Report for 11/07/2020 Arrival Information Details Patient Name: Date of Service: [name] 11/07/2020 1:00 PM Medical Record Number: [id number] Patient Account Number: [id number] Date of Birth/Sex: Treating RN: 12/24/1951 (69 y.o. [name] [name] Primary Care Perfecto Purdy: [name] Other Clinician: Referring Cyris Maalouf: Treating Bryla Burek/Extender: [name] in Treatment: 5 Visit Information History Since Last Visit Added or deleted any medications: No Patient Arrived: Ambulatory Any new allergies or adverse reactions: No Arrival Time: 12:58 Had a fall or experienced change in No Accompanied By: husband activities of daily living that may affect Transfer Assistance: None risk of falls: Patient Identification Verified: Yes Signs or symptoms of abuse/neglect since last visito No Secondary Verification Process Completed: Yes Hospitalized since last visit: No Patient Requires Transmission-Based Precautions: No Implantable device outside of the clinic excluding No Patient Has Alerts: Yes cellular tissue based products placed in the center Patient Alerts: R ABI: [place] TBI: 0.41 since last visit: L ABI: [place] (07/2020) Has Dressing in Place as Prescribed: Yes Pain Present Now: Yes Electronic Signature(s) Signed: 11/07/2020 4:07:04 PM By: Sandre Kitty Entered By: Sandre Kitty on 11/07/2020 12:58:45 -------------------------------------------------------------------------------- Clinic Level of Care Assessment Details Patient Name: Date of Service: [name]. 11/07/2020 1:00 PM Medical Record Number: [id number] Patient Account Number: [id number] Date of Birth/Sex: Treating RN: [birth year] (69 y.o. [name] [name] Primary Care Zulma Court: [name] Other Clinician: Referring Kane Kusek: Treating Mehul Rudin/Extender: [name] in Treatment: 450 Clinic Level of Care Assessment  Items TOOL 4 Quantity Score X- 1 0 Use when only an EandM is performed on FOLLOW-UP visit ASSESSMENTS - Nursing Assessment / Reassessment X- 1 10 Reassessment of Co-morbidities (includes updates in patient status) X- 1 5 Reassessment of Adherence to Treatment Plan ASSESSMENTS - Wound and Skin A ssessment / Reassessment _0  - 0 Simple Wound Assessment / Reassessment - one wound X- 9 5 Complex Wound Assessment / Reassessment - multiple wounds _1  - 0 Dermatologic / Skin Assessment (not related to wound area) ASSESSMENTS - Focused Assessment _2  - 0 Circumferential Edema Measurements - multi extremities _3  - 0 Nutritional Assessment / Counseling / Intervention X- 1 5 Lower Extremity Assessment (monofilament, tuning fork, pulses) _4  - 0 Peripheral Arterial Disease Assessment (using hand held doppler) ASSESSMENTS - Ostomy and/or Continence Assessment and Care _5  - 0 Incontinence Assessment and Management _6  - 0 Ostomy Care Assessment and Management (repouching, etc.) PROCESS - Coordination of Care X - Simple Patient / Family Education for ongoing care 1 15 _7  - 0 Complex (extensive) Patient / Family Education for ongoing care X- 1 10 Staff obtains Programmer, systems, Records, T Results / Process Orders est _8  - 0 Staff telephones HHA, Nursing Homes / Clarify orders / etc _9  - 0 Routine Transfer to another Facility (non-emergent condition) _10  - 0 Routine Hospital Admission (non-emergent condition) _11  - 0 New Admissions / Biomedical engineer / Ordering NPWT Apligraf, etc. , _12  - 0 Emergency Hospital Admission (emergent condition) _13  - 0 Simple Discharge Coordination _14  - 0 Complex (extensive) Discharge Coordination PROCESS - Special Needs _15  - 0 Pediatric / Minor Patient Management _16  - 0 Isolation Patient Management _17  - 0 Hearing / Language / Visual special needs _18  - 0 Assessment of Community assistance (transportation, D/C planning, etc.) _19  - 0 Additional  assistance / Altered mentation _20  - 0 Support Surface(s) Assessment (bed, cushion, seat, etc.) INTERVENTIONS - Wound Cleansing / Measurement _21  - 0 Simple Wound  Cleansing - one wound X- 9 5 Complex Wound Cleansing - multiple wounds X- 1 5 Wound Imaging (photographs - any number of wounds) _0  - 0 Wound Tracing (instead of photographs) _1  - 0 Simple Wound Measurement - one wound X- 9 5 Complex Wound Measurement - multiple wounds INTERVENTIONS - Wound Dressings _2  - 0 Small Wound Dressing one or multiple wounds _3  - 0 Medium Wound Dressing one or multiple wounds X- 2 20 Large Wound Dressing one or multiple wounds X- 1 5 Application of Medications - topical <[id number]>_0<\/[id number]>_9  - 0 Application of Medications - injection INTERVENTIONS - Miscellaneous _5  - 0 External ear exam _6  - 0 Specimen Collection (cultures, biopsies, blood, body fluids, etc.) _7  - 0 Specimen(s) / Culture(s) sent or taken to Lab for analysis _8  - 0 Patient Transfer (multiple staff / Civil Service fast streamer / Similar devices) _9  - 0 Simple Staple / Suture removal (25 or less) _10  - 0 Complex Staple / Suture removal (26 or more) _11  - 0 Hypo / Hyperglycemic Management (close monitor of Blood Glucose) _12  - 0 Ankle / Brachial Index (ABI) - do not check if billed separately X- 1 5 Vital Signs Has the patient been seen at the hospital within the last three years: Yes Total Score: 235 Level Of Care: New/Established - Level 5 Electronic Signature(s) Signed: 11/07/2020 6:16:40 PM By: Levan Hurst RN, BSN Entered By: Levan Hurst on 11/07/2020 17:57:23 -------------------------------------------------------------------------------- Encounter Discharge Information Details Patient Name: Date of Service: [name], [name] 11/07/2020 1:00 PM Medical Record Number: [id number] Patient Account Number: [id number] Date of Birth/Sex: Treating RN: 06-21-1951 (69 y.o. [name] Primary Care Cally Nygard: [name] Other  Clinician: Referring Otto Caraway: Treating Linzie Boursiquot/Extender: [name] in Treatment: 42 Encounter Discharge Information Items Discharge Condition: Stable Ambulatory Status: Ambulatory Discharge Destination: Home Transportation: Private Auto Accompanied By: husband Schedule Follow-up Appointment: Yes Clinical Summary of Care: Electronic Signature(s) Signed: 11/07/2020 5:46:03 PM By: Deon Pilling Entered By: Deon Pilling on 11/07/2020 14:55:10 -------------------------------------------------------------------------------- Lower Extremity Assessment Details Patient Name: Date of Service: [name]. 11/07/2020 1:00 PM Medical Record Number: [id number] Patient Account Number: [id number] Date of Birth/Sex: Treating RN: 07-12-1951 (69 y.o. [name] Primary Care Tamiko Leopard: [name] Other Clinician: Referring Rashaad Hallstrom: Treating Tonita Bills/Extender: [name] in Treatment: 450 Edema Assessment Assessed: [name]: No] [Right: No] Edema: [Left: Yes] [Right: Yes] Calf Left: Right: Point of Measurement: 36 cm From Medial Instep 30.2 cm 33.8 cm Ankle Left: Right: Point of Measurement: 10 cm From Medial Instep 21.5 cm 22 cm Vascular Assessment Pulses: Dorsalis Pedis Palpable: [Left:No] [Right:No] Electronic Signature(s) Signed: 11/07/2020 4:55:08 PM By: Baruch Gouty RN, BSN Entered By: Baruch Gouty on 11/07/2020 13:32:34 -------------------------------------------------------------------------------- Multi Wound Chart Details Patient Name: Date of Service: [name], [name] 11/07/2020 1:00 PM Medical Record Number: [id number] Patient Account Number: [id number] Date of Birth/Sex: Treating RN: Feb 18, 1951 (69 y.o. 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] Primary Etiology: [16:Anemia, Hypertension, Peripheral] [19:Anemia, Hypertension, Peripheral] [20:Anemia, Hypertension, Peripheral] Comorbid History: [16:Arterial Disease, Peripheral Venous Arterial  Disease, Peripheral Venous Arterial Disease, Peripheral Venous Disease, Raynauds, Scleroderma, Rheumatoid Arthritis, Osteoarthritis Rheumatoid Arthritis, Osteoarthritis Rheumatoid  Arthritis, Osteoarthritis 01/04/2020] [19:Disease, Raynauds, Scleroderma, Disease, Raynauds, Scleroderma, 05/02/2020] [20:05/02/2020] Date Acquired: [16:44] [19:27] [20:27] Weeks of Treatment: [16:Open] [19:Open] [20:Open] Wound Status: [16:No] [19:No] [20:No] Clustered Wound: [16:0.5x1.1x0.7] [19:0.4x0.3x0.2] [20:0.9x0.8x0.2] Measurements L x W x D (cm) [16:0.432] [19:0.094] [20:0.565] A (cm) : rea [16:0.302] [19:0.019] [20:0.113] Volume (cm) : [16:96.60%] [19:33.30%] [20:-28.40%] % Reduction in Area: [16:76.00%] [19:32.10%] [20:-28.40%] % Reduction in Volume: [16:Category/Stage III] [19:Full Thickness Without Exposed] [20:Full Thickness Without Exposed] Classification: [16:Small] [19:Support Structures Small] [20:Support Structures Small] Exudate Amount: [16:Serosanguineous] [19:Serous] [20:Serosanguineous] Exudate Type: [16:red, brown] [19:amber] [20:red, brown] Exudate Color: [16:Flat and Intact] [19:Flat and Intact] [20:Well defined, not attached] Wound Margin: [16:Small (1-33%)] [19:None Present (0%)] [20:None Present (0%)] Granulation Amount: [16:Pink] [19:N/A]  [20:N/A] Granulation Quality: [16:Large (67-100%)] [19:Large (67-100%)] [20:Large (67-100%)] Necrotic Amount: [16:Fat Layer (Subcutaneous Tissue): Yes Fat Layer (Subcutaneous Tissue): Yes Fat Layer (Subcutaneous Tissue): Yes] Exposed Structures: [16:Fascia: No Tendon: No Muscle: No Joint: No Bone: No None] [19:Fascia: No Tendon: No Muscle: No Joint: No Bone: No None] [20:Fascia: No Tendon: No Muscle: No Joint: No Bone: No None] Wound Number: _0 Photos: No Photos No Photos No Photos Right, Posterior Lower Leg Right, Anterior Lower Leg Left, Lateral Foot Wound Location: Gradually Appeared Gradually Appeared Gradually Appeared 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 Primary Etiology: Anemia, Hypertension, Peripheral Anemia, Hypertension, Peripheral Anemia, Hypertension, Peripheral Comorbid History: Arterial Disease, Peripheral Venous Arterial Disease, Peripheral Venous Arterial Disease, Peripheral Venous Disease, Raynauds, Scleroderma, Disease, Raynauds, Scleroderma, Disease, Raynauds, Scleroderma, Rheumatoid Arthritis, Osteoarthritis Rheumatoid Arthritis, Osteoarthritis Rheumatoid Arthritis, Osteoarthritis 08/15/2020 10/10/2020 10/10/2020 Date Acquired: _1 Weeks of Treatment: Open Open Open Wound Status: No No No Clustered Wound: 1.9x2.8x0.2 1.5x0.9x0.3 0.4x0.5x0.1 Measurements L x W x D (cm) 4.178 1.06 0.157 A (cm) : rea 0.836 0.318 0.016 Volume (cm) : -272.00% 12.40% 58.40% % Reduction in Area: -646.40% 12.40% 57.90% % Reduction in Volume: Full Thickness Without Exposed Full Thickness Without Exposed Unclassifiable Classification: Support Structures Support Structures Medium Medium Small Exudate Amount: Serous Serous Serous Exudate Type: Geophysical data processor Exudate Color: Indistinct, nonvisible Distinct, outline attached Distinct, outline attached Wound Margin: None Present (0%) None Present (0%) None Present (0%) Granulation  Amount: N/A N/A N/A Granulation Quality: Large (67-100%) Large (67-100%) Large (67-100%) Necrotic Amount: Fat Layer (Subcutaneous Tissue): Yes Fat Layer (Subcutaneous Tissue): Yes Fat Layer (Subcutaneous Tissue): Yes Exposed Structures: Fascia: No Fascia: No Fascia: No Tendon: No Tendon: No Tendon: No Muscle: No Muscle: No Muscle: No Joint: No Joint: No Joint: No Bone: No Bone: No Bone: No Small (1-33%) None Small (1-33%) Epithelialization: Wound Number: _2 Photos: No Photos No Photos No Photos Right, Lateral Lower Leg Right, Distal, Anterior Lower Leg Right, Medial Lower Leg Wound Location: Gradually Appeared Gradually Appeared Gradually Appeared 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 Primary Etiology: Anemia, Hypertension, Peripheral Anemia, Hypertension, Peripheral Anemia, Hypertension, Peripheral Comorbid History: Arterial Disease, Peripheral Venous Arterial Disease, Peripheral Venous Arterial Disease, Peripheral Venous Disease, Raynauds, Scleroderma, Disease, Raynauds, Scleroderma, Disease, Raynauds, Scleroderma, Rheumatoid Arthritis, Osteoarthritis Rheumatoid Arthritis, Osteoarthritis Rheumatoid Arthritis, Osteoarthritis 10/27/2020 11/07/2020 01/13/2013 Date Acquired: 1 0 406 Weeks of Treatment: Open Open Open Wound Status: No No Yes Clustered Wound: 1.1x0.8x0.3 0.6x0.6x0.1 2x2.1x0.3 Measurements L x W x D (cm) 0.691 0.283 3.299 A (cm) : rea 0.207 0.028 0.99 Volume (cm) : -39.60% 0.00% -133.30% % Reduction in Area: -  39.90% 0.00% -602.10% % Reduction in Volume: Full Thickness Without Exposed Full Thickness Without Exposed Full Thickness With Exposed Support Classification: Support Structures Support Structures Structures Medium Small Medium Exudate Amount: Serosanguineous Serous Serosanguineous Exudate Type: red, brown amber red, brown Exudate Color: Flat and Intact Flat and Intact Distinct, outline attached Wound  Margin: None Present (0%) None Present (0%) Medium (34-66%) Granulation Amount: N/A N/A Red, Pink Granulation Quality: Large (67-100%) Large (67-100%) Medium (34-66%) Necrotic Amount: Fat Layer (Subcutaneous Tissue): Yes Fat Layer (Subcutaneous Tissue): Yes Fat Layer (Subcutaneous Tissue): Yes Exposed Structures: Fascia: No Fascia: No Fascia: No Tendon: No Tendon: No Tendon: No Muscle: No Muscle: No Muscle: No Joint: No Joint: No Joint: No Bone: No Bone: No Bone: No None Small (1-33%) Small (1-33%) Epithelialization: Treatment Notes Electronic Signature(s) Signed: 11/07/2020 6:16:40 PM By: Levan Hurst RN, BSN Signed: 11/08/2020 3:11:24 PM By: Linton Ham MD Entered By: Linton Ham on 11/07/2020 14:28:53 -------------------------------------------------------------------------------- Multi-Disciplinary Care Plan Details Patient Name: Date of Service: [name], [name] 11/07/2020 1:00 PM Medical Record Number: [id number] Patient Account Number: [id number] Date of Birth/Sex: Treating RN: [birth year] (69 y.o. [name] [name] Primary Care Eiza Canniff: [name] Other Clinician: Referring [name] [name]: Treating Tavaras Goody/Extender: [name] in Treatment: 450 Active Inactive Wound/Skin Impairment Nursing Diagnoses: Impaired tissue integrity Knowledge deficit related to ulceration/compromised skin integrity Goals: Patient/caregiver will verbalize understanding of skin care regimen Date Initiated: 10/11/2016 Target Resolution Date: 12/09/2020 Goal Status: Active Interventions: Assess patient/caregiver ability to obtain necessary supplies Assess patient/caregiver ability to perform ulcer/skin care regimen upon admission and as needed Assess ulceration(s) every visit Provide education on ulcer and skin care Treatment Activities: Skin care regimen initiated : 10/11/2016 Topical wound management initiated : 10/11/2016 Notes: Electronic  Signature(s) Signed: 11/07/2020 6:16:40 PM By: Levan Hurst RN, BSN Entered By: Levan Hurst on 11/07/2020 17:55:01 -------------------------------------------------------------------------------- Pain Assessment Details Patient Name: Date of Service: [name], [name] 11/07/2020 1:00 PM Medical Record Number: [id number] Patient Account Number: [id number] Date of Birth/Sex: Treating RN: 1951-04-08 (69 y.o. [name] [name] Primary Care Arloa Prak: [name] Other Clinician: Referring Arizona Nordquist: Treating Adren Dollins/Extender: [name] in Treatment: 450 Active Problems Location of Pain Severity and Description of Pain Patient Has Paino Yes Site Locations Rate the pain. Current Pain Level: 7 Pain Management and Medication Current Pain Management: Electronic Signature(s) Signed: 11/07/2020 4:07:04 PM By: Sandre Kitty Signed: 11/07/2020 6:16:40 PM By: Levan Hurst RN, BSN Entered By: Sandre Kitty on 11/07/2020 13:00:22 -------------------------------------------------------------------------------- Patient/Caregiver Education Details Patient Name: Date of Service: [name] 11/8/2021andnbsp1:00 PM Medical Record Number: [id number] Patient Account Number: [id number] Date of Birth/Gender: Treating RN: 04-28-1951 (69 y.o. [name] [name] Primary Care Physician: [name] Other Clinician: Referring Physician: Treating Physician/Extender: [name] in Treatment: 41 Education Assessment Education Provided To: Patient Education Topics Provided Wound/Skin Impairment: Methods: Explain/Verbal Responses: State content correctly [employer]) Signed: 11/07/2020 6:16:40 PM By: Levan Hurst RN, BSN Entered By: Levan Hurst on 11/07/2020 17:56:21 -------------------------------------------------------------------------------- Wound Assessment Details Patient Name: Date of Service: [name],  [name] 11/07/2020 1:00 PM Medical Record Number: [id number] Patient Account Number: [id number] Date of Birth/Sex: Treating RN: 1951/11/20 (69 y.o. [name] [name] Primary Care Walter Grima: [name] Other Clinician: Referring Wendell Fiebig: Treating Yanique Mulvihill/Extender: [name] in Treatment: 450 Wound Status Wound Number: 16 Primary Pressure Ulcer Etiology: Wound Location: Right Calcaneus Wound Open Wounding Event: Pressure Injury Status:  Date Acquired: 01/04/2020 Comorbid Anemia, Hypertension, Peripheral Arterial Disease, Peripheral Weeks Of Treatment: 44 History: Venous Disease, Raynauds, Scleroderma, Rheumatoid Arthritis, Clustered Wound: No Osteoarthritis Photos Photo Uploaded By: Mikeal Hawthorne on 11/08/2020 10:20:52 Wound Measurements Length: (cm) 0.5 Width: (cm) 1.1 Depth: (cm) 0.7 Area: (cm) 0.432 Volume: (cm) 0.302 % Reduction in Area: 96.6% % Reduction in Volume: 76% Epithelialization: None Tunneling: No Undermining: No Wound Description Classification: Category/Stage III Wound Margin: Flat and Intact Exudate Amount: Small Exudate Type: Serosanguineous Exudate Color: red, brown Foul Odor After Cleansing: No Slough/Fibrino Yes Wound Bed Granulation Amount: Small (1-33%) Exposed Structure Granulation Quality: Pink Fascia Exposed: No Necrotic Amount: Large (67-100%) Fat Layer (Subcutaneous Tissue) Exposed: Yes Necrotic Quality: Adherent Slough Tendon Exposed: No Muscle Exposed: No Joint Exposed: No Bone Exposed: No Treatment Notes Wound #16 (Right Calcaneus) 1. Cleanse With Wound Cleanser Soap and water 2. Periwound Care Moisturizing lotion 3. Primary Dressing Applied Iodoflex 4. Secondary Dressing Dry Gauze Roll Gauze Notes did not apply coban related to patient going straight to [facility] for a DVT rule out of right leg. Electronic Signature(s) Signed: 11/07/2020 4:55:08 PM By: Baruch Gouty RN, BSN Signed:  11/07/2020 6:16:40 PM By: Levan Hurst RN, BSN Entered By: Baruch Gouty on 11/07/2020 13:33:51 -------------------------------------------------------------------------------- Wound Assessment Details Patient Name: Date of Service: [name], [name] 11/07/2020 1:00 PM Medical Record Number: [id number] Patient Account Number: [id number] Date of Birth/Sex: Treating RN: [birth year] (69 y.o. [name] [name] Primary Care Fitzgerald Dunne: [name] Other Clinician: Referring Rashonda Warrior: Treating Achillies Buehl/Extender: [name] in Treatment: 450 Wound Status Wound Number: 19 Primary Venous Leg Ulcer Etiology: Wound Location: Right, Lateral Malleolus Wound Open Wounding Event: Gradually Appeared Status: Date Acquired: 05/02/2020 Comorbid Anemia, Hypertension, Peripheral Arterial Disease, Peripheral Weeks Of Treatment: 27 History: Venous Disease, Raynauds, Scleroderma, Rheumatoid Arthritis, Clustered Wound: No Osteoarthritis Photos Photo Uploaded By: Mikeal Hawthorne on 11/08/2020 09:11:12 Wound Measurements Length: (cm) 0.4 Width: (cm) 0.3 Depth: (cm) 0.2 Area: (cm) 0.094 Volume: (cm) 0.019 % Reduction in Area: 33.3% % Reduction in Volume: 32.1% Epithelialization: None Tunneling: No Undermining: No Wound Description Classification: Full Thickness Without Exposed Support Structures Wound Margin: Flat and Intact Exudate Amount: Small Exudate Type: Serous Exudate Color: amber Foul Odor After Cleansing: No Slough/Fibrino Yes Wound Bed Granulation Amount: None Present (0%) Exposed Structure Necrotic Amount: Large (67-100%) Fascia Exposed: No Necrotic Quality: Adherent Slough Fat Layer (Subcutaneous Tissue) Exposed: Yes Tendon Exposed: No Muscle Exposed: No Joint Exposed: No Bone Exposed: No Treatment Notes Wound #19 (Right, Lateral Malleolus) 1. Cleanse With Wound Cleanser Soap and water 2. Periwound Care Moisturizing lotion 3. Primary Dressing  Applied Iodoflex 4. Secondary Dressing Dry Gauze Roll Gauze Notes did not apply coban related to patient going straight to [facility] for a DVT rule out of right leg. Electronic Signature(s) Signed: 11/07/2020 4:55:08 PM By: Baruch Gouty RN, BSN Signed: 11/07/2020 6:16:40 PM By: Levan Hurst RN, BSN Entered By: Baruch Gouty on 11/07/2020 13:35:54 -------------------------------------------------------------------------------- Wound Assessment Details Patient Name: Date of Service: [name], [name] 11/07/2020 1:00 PM Medical Record Number: [id number] Patient Account Number: [id number] Date of Birth/Sex: Treating RN: 1951/05/02 (69 y.o. [name] [name] Primary Care Shareen Capwell: [name] Other Clinician: Referring Hanzel Pizzo: Treating Omesha Bowerman/Extender: [name] in Treatment: 450 Wound Status Wound Number: 20 Primary Arterial Insufficiency Ulcer Etiology: Wound Location: Left, Lateral Malleolus Wound Open Wounding Event: Gradually Appeared Status: Date Acquired: 05/02/2020 Comorbid Anemia, Hypertension, Peripheral Arterial Disease, Peripheral  Weeks Of Treatment: 27 History: Venous Disease, Raynauds, Scleroderma, Rheumatoid Arthritis, Clustered Wound: No Osteoarthritis Photos Photo Uploaded By: Mikeal Hawthorne on 11/08/2020 09:10:08 Wound Measurements Length: (cm) 0.9 Width: (cm) 0.8 Depth: (cm) 0.2 Area: (cm) 0.565 Volume: (cm) 0.113 % Reduction in Area: -28.4% % Reduction in Volume: -28.4% Epithelialization: None Tunneling: No Undermining: No Wound Description Classification: Full Thickness Without Exposed Support Structu Wound Margin: Well defined, not attached Exudate Amount: Small Exudate Type: Serosanguineous Exudate Color: red, brown res Foul Odor After Cleansing: No Slough/Fibrino Yes Wound Bed Granulation Amount: None Present (0%) Exposed Structure Necrotic Amount: Large (67-100%) Fascia Exposed: No Necrotic  Quality: Adherent Slough Fat Layer (Subcutaneous Tissue) Exposed: Yes Tendon Exposed: No Muscle Exposed: No Joint Exposed: No Bone Exposed: No Treatment Notes Wound #20 (Left, Lateral Malleolus) 1. Cleanse With Wound Cleanser Soap and water 2. Periwound Care Moisturizing lotion 3. Primary Dressing Applied Iodoflex 4. Secondary Dressing Dry Gauze 6. Support Layer Holiday representative) Signed: 11/07/2020 4:55:08 PM By: Baruch Gouty RN, BSN Signed: 11/07/2020 6:16:40 PM By: Levan Hurst RN, BSN Entered By: Baruch Gouty on 11/07/2020 13:36:36 -------------------------------------------------------------------------------- Wound Assessment Details Patient Name: Date of Service: [name], [name] 11/07/2020 1:00 PM Medical Record Number: [id number] Patient Account Number: [id number] Date of Birth/Sex: Treating RN: 1951-03-25 (69 y.o. [name] [name] Primary Care Carzell Saldivar: [name] Other Clinician: Referring Litisha Guagliardo: Treating Kieren Adkison/Extender: [name] in Treatment: 450 Wound Status Wound Number: 23 Primary Venous Leg Ulcer Etiology: Wound Location: Right, Posterior Lower Leg Wound Open Wounding Event: Gradually Appeared Status: Date Acquired: 08/15/2020 Comorbid Anemia, Hypertension, Peripheral Arterial Disease, Peripheral Weeks Of Treatment: 12 History: Venous Disease, Raynauds, Scleroderma, Rheumatoid Arthritis, Clustered Wound: No Osteoarthritis Photos Photo Uploaded By: Mikeal Hawthorne on 11/08/2020 09:11:40 Wound Measurements Length: (cm) 1.9 Width: (cm) 2.8 Depth: (cm) 0.2 Area: (cm) 4.178 Volume: (cm) 0.836 % Reduction in Area: -272% % Reduction in Volume: -646.4% Epithelialization: Small (1-33%) Tunneling: No Undermining: No Wound Description Classification: Full Thickness Without Exposed Support Structures Wound Margin: Indistinct, nonvisible Exudate Amount: Medium Exudate Type:  Serous Exudate Color: amber Foul Odor After Cleansing: No Slough/Fibrino Yes Wound Bed Granulation Amount: None Present (0%) Exposed Structure Necrotic Amount: Large (67-100%) Fascia Exposed: No Necrotic Quality: Adherent Slough Fat Layer (Subcutaneous Tissue) Exposed: Yes Tendon Exposed: No Muscle Exposed: No Joint Exposed: No Bone Exposed: No Treatment Notes Wound #23 (Right, Posterior Lower Leg) 1. Cleanse With Wound Cleanser Soap and water 2. Periwound Care Moisturizing lotion 3. Primary Dressing Applied Iodoflex 4. Secondary Dressing Dry Gauze Roll Gauze Notes did not apply coban related to patient going straight [facility] for a DVT rule out of right leg. Electronic Signature(s) Signed: 11/07/2020 4:55:08 PM By: Baruch Gouty RN, BSN Signed: 11/07/2020 6:16:40 PM By: Levan Hurst RN, BSN Entered By: Baruch Gouty on 11/07/2020 13:37:40 -------------------------------------------------------------------------------- Wound Assessment Details Patient Name: Date of Service: [name], [name] 11/07/2020 1:00 PM Medical Record Number: [id number] Patient Account Number: [id number] Date of Birth/Sex: Treating RN: 1951/02/14 (69 y.o. [name] [name] Primary Care Eldora Napp: [name] Other Clinician: Referring Chelsey Redondo: Treating Zelig Gacek/Extender: [name] in Treatment: 450 Wound Status Wound Number: 24 Primary Venous Leg Ulcer Etiology: Wound Location: Right, Anterior Lower Leg Wound Open Wounding Event: Gradually Appeared Status: Date Acquired: 10/10/2020 Comorbid Anemia, Hypertension, Peripheral Arterial Disease, Peripheral Weeks Of Treatment: 4 History: Venous Disease, Raynauds, Scleroderma, Rheumatoid Arthritis, Clustered Wound: No Osteoarthritis Photos Photo Uploaded By: Mikeal Hawthorne  on 11/08/2020 09:10:30 Wound Measurements Length: (cm) 1.5 Width: (cm) 0.9 Depth: (cm) 0.3 Area: (cm) 1.06 Volume: (cm)  0.318 % Reduction in Area: 12.4% % Reduction in Volume: 12.4% Epithelialization: None Tunneling: No Undermining: No Wound Description Classification: Full Thickness Without Exposed Support Structures Wound Margin: Distinct, outline attached Exudate Amount: Medium Exudate Type: Serous Exudate Color: amber Foul Odor After Cleansing: No Slough/Fibrino Yes Wound Bed Granulation Amount: None Present (0%) Exposed Structure Necrotic Amount: Large (67-100%) Fascia Exposed: No Necrotic Quality: Adherent Slough Fat Layer (Subcutaneous Tissue) Exposed: Yes Tendon Exposed: No Muscle Exposed: No Joint Exposed: No Bone Exposed: No Treatment Notes Wound #24 (Right, Anterior Lower Leg) 1. Cleanse With Wound Cleanser Soap and water 2. Periwound Care Moisturizing lotion 3. Primary Dressing Applied Iodoflex 4. Secondary Dressing Dry Gauze Roll Gauze Notes did not apply coban related to patient going straight to [facility] for a DVT rule out of right leg. Electronic Signature(s) Signed: 11/07/2020 4:55:08 PM By: Baruch Gouty RN, BSN Signed: 11/07/2020 6:16:40 PM By: Levan Hurst RN, BSN Entered By: Baruch Gouty on 11/07/2020 13:38:34 -------------------------------------------------------------------------------- Wound Assessment Details Patient Name: Date of Service: [name], [name] 11/07/2020 1:00 PM Medical Record Number: [id number] Patient Account Number: [id number] Date of Birth/Sex: Treating RN: 26-Feb-1951 (69 y.o. [name] [name] Primary Care Jacquetta Polhamus: [name] Other Clinician: Referring Gali Spinney: Treating Kinnie Kaupp/Extender: [name] in Treatment: 450 Wound Status Wound Number: 25 Primary Venous Leg Ulcer Etiology: Wound Location: Left, Lateral Foot Wound Open Wounding Event: Gradually Appeared Status: Date Acquired: 10/10/2020 Comorbid Anemia, Hypertension, Peripheral Arterial Disease, Peripheral Weeks Of Treatment:  4 History: Venous Disease, Raynauds, Scleroderma, Rheumatoid Arthritis, Clustered Wound: No Osteoarthritis Photos Photo Uploaded By: Mikeal Hawthorne on 11/08/2020 09:09:47 Wound Measurements Length: (cm) 0.4 Width: (cm) 0.5 Depth: (cm) 0.1 Area: (cm) 0.157 Volume: (cm) 0.016 % Reduction in Area: 58.4% % Reduction in Volume: 57.9% Epithelialization: Small (1-33%) Tunneling: No Undermining: No Wound Description Classification: Unclassifiable Wound Margin: Distinct, outline attached Exudate Amount: Small Exudate Type: Serous Exudate Color: amber Foul Odor After Cleansing: No Slough/Fibrino Yes Wound Bed Granulation Amount: None Present (0%) Exposed Structure Necrotic Amount: Large (67-100%) Fascia Exposed: No Necrotic Quality: Adherent Slough Fat Layer (Subcutaneous Tissue) Exposed: Yes Tendon Exposed: No Muscle Exposed: No Joint Exposed: No Bone Exposed: No Treatment Notes Wound #25 (Left, Lateral Foot) 1. Cleanse With Wound Cleanser Soap and water 2. Periwound Care Moisturizing lotion 3. Primary Dressing Applied Iodoflex 4. Secondary Dressing Dry Gauze 6. Support Layer Holiday representative) Signed: 11/07/2020 4:55:08 PM By: Baruch Gouty RN, BSN Signed: 11/07/2020 6:16:40 PM By: Levan Hurst RN, BSN Entered By: Baruch Gouty on 11/07/2020 13:39:19 -------------------------------------------------------------------------------- Wound Assessment Details Patient Name: Date of Service: [name], [name] 11/07/2020 1:00 PM Medical Record Number: [id number] Patient Account Number: [id number] Date of Birth/Sex: Treating RN: 10-Jan-1951 (69 y.o. [name] [name] Primary Care Brittini Brubeck: [name] Other Clinician: Referring Ajahnae Rathgeber: Treating Paiton Boultinghouse/Extender: [name] in Treatment: 450 Wound Status Wound Number: 26 Primary Venous Leg Ulcer Etiology: Wound Location: Right, Lateral Lower Leg Wound  Open Wounding Event: Gradually Appeared Status: Date Acquired: 10/27/2020 Comorbid Anemia, Hypertension, Peripheral Arterial Disease, Peripheral Weeks Of Treatment: 1 History: Venous Disease, Raynauds, Scleroderma, Rheumatoid Arthritis, Clustered Wound: No Osteoarthritis Photos Photo Uploaded By: Mikeal Hawthorne on 11/08/2020 09:11:13 Wound Measurements Length: (cm) 1.1 Width: (cm) 0.8 Depth: (cm) 0.3 Area: (cm) 0.691 Volume: (cm) 0.207 % Reduction in Area: -39.6% % Reduction in  Volume: -39.9% Epithelialization: None Tunneling: No Undermining: No Wound Description Classification: Full Thickness Without Exposed Support Structures Wound Margin: Flat and Intact Exudate Amount: Medium Exudate Type: Serosanguineous Exudate Color: red, brown Foul Odor After Cleansing: No Slough/Fibrino Yes Wound Bed Granulation Amount: None Present (0%) Exposed Structure Necrotic Amount: Large (67-100%) Fascia Exposed: No Necrotic Quality: Adherent Slough Fat Layer (Subcutaneous Tissue) Exposed: Yes Tendon Exposed: No Muscle Exposed: No Joint Exposed: No Bone Exposed: No Treatment Notes Wound #26 (Right, Lateral Lower Leg) 1. Cleanse With Wound Cleanser Soap and water 2. Periwound Care Moisturizing lotion 3. Primary Dressing Applied Iodoflex 4. Secondary Dressing Dry Gauze Roll Gauze Notes did not apply coban related to patient going straight to [facility] for a DVT rule out of right leg. Electronic Signature(s) Signed: 11/07/2020 4:55:08 PM By: Baruch Gouty RN, BSN Signed: 11/07/2020 6:16:40 PM By: Levan Hurst RN, BSN Entered By: Baruch Gouty on 11/07/2020 13:40:06 -------------------------------------------------------------------------------- Wound Assessment Details Patient Name: Date of Service: [name], [name] 11/07/2020 1:00 PM Medical Record Number: [id number] Patient Account Number: [id number] Date of Birth/Sex: Treating RN: [birth year] (69 y.o. [name] [name] Primary Care Avin Upperman: [name] Other Clinician: Referring Tya Haughey: Treating Adyn Hoes/Extender: [name] in Treatment: 450 Wound Status Wound Number: 27 Primary Venous Leg Ulcer Etiology: Wound Location: Right, Distal, Anterior Lower Leg Wound Open Wounding Event: Gradually Appeared Status: Date Acquired: 11/07/2020 Comorbid Anemia, Hypertension, Peripheral Arterial Disease, Peripheral Weeks Of Treatment: 0 History: Venous Disease, Raynauds, Scleroderma, Rheumatoid Arthritis, Clustered Wound: No Osteoarthritis Photos Photo Uploaded By: Mikeal Hawthorne on 11/08/2020 09:11:41 Wound Measurements Length: (cm) 0.6 Width: (cm) 0.6 Depth: (cm) 0.1 Area: (cm) 0.283 Volume: (cm) 0.028 % Reduction in Area: 0% % Reduction in Volume: 0% Epithelialization: Small (1-33%) Tunneling: No Undermining: No Wound Description Classification: Full Thickness Without Exposed Support Structures Wound Margin: Flat and Intact Exudate Amount: Small Exudate Type: Serous Exudate Color: amber Foul Odor After Cleansing: No Slough/Fibrino Yes Wound Bed Granulation Amount: None Present (0%) Exposed Structure Necrotic Amount: Large (67-100%) Fascia Exposed: No Necrotic Quality: Adherent Slough Fat Layer (Subcutaneous Tissue) Exposed: Yes Tendon Exposed: No Muscle Exposed: No Joint Exposed: No Bone Exposed: No Treatment Notes Wound #27 (Right, Distal, Anterior Lower Leg) 1. Cleanse With Wound Cleanser Soap and water 2. Periwound Care Moisturizing lotion 3. Primary Dressing Applied Iodoflex 4. Secondary Dressing Dry Gauze Roll Gauze Notes did not apply coban related to patient going straight to [facility] for a DVT rule out of right leg. Electronic Signature(s) Signed: 11/07/2020 4:55:08 PM By: Baruch Gouty RN, BSN Signed: 11/07/2020 6:16:40 PM By: Levan Hurst RN, BSN Entered By: Baruch Gouty on 11/07/2020  13:40:58 -------------------------------------------------------------------------------- Wound Assessment Details Patient Name: Date of Service: [name], [name] 11/07/2020 1:00 PM Medical Record Number: [id number] Patient Account Number: [id number] Date of Birth/Sex: Treating RN: 13-Feb-1951 (69 y.o. [name] [name] Primary Care Rasa Degrazia: [name] Other Clinician: Referring Iness Pangilinan: Treating Rudy Luhmann/Extender: [name] in Treatment: 450 Wound Status Wound Number: 5 Primary Venous Leg Ulcer Etiology: Wound Location: Right, Medial Lower Leg Wound Open Wounding Event: Gradually Appeared Status: Date Acquired: 01/13/2013 Comorbid Anemia, Hypertension, Peripheral Arterial Disease, Peripheral Weeks Of Treatment: 406 History: Venous Disease, Raynauds, Scleroderma, Rheumatoid Arthritis, Clustered Wound: Yes Osteoarthritis Photos Photo Uploaded By: Mikeal Hawthorne on 11/08/2020 09:10:47 Wound Measurements Length: (cm) 2 Width: (cm) 2.1 Depth: (cm) 0.3 Area: (cm) 3.299 Volume: (cm) 0.99 % Reduction in Area: -133.3% % Reduction in  Volume: -602.1% Epithelialization: Small (1-33%) Tunneling: No Undermining: No Wound Description Classification: Full Thickness With Exposed Support Structures Wound Margin: Distinct, outline attached Exudate Amount: Medium Exudate Type: Serosanguineous Exudate Color: red, brown Foul Odor After Cleansing: No Slough/Fibrino Yes Wound Bed Granulation Amount: Medium (34-66%) Exposed Structure Granulation Quality: Red, Pink Fascia Exposed: No Necrotic Amount: Medium (34-66%) Fat Layer (Subcutaneous Tissue) Exposed: Yes Necrotic Quality: Adherent Slough Tendon Exposed: No Muscle Exposed: No Joint Exposed: No Bone Exposed: No Treatment Notes Wound #5 (Right, Medial Lower Leg) 1. Cleanse With Wound Cleanser Soap and water 2. Periwound Care Moisturizing lotion 3. Primary Dressing Applied Iodoflex 4. Secondary  Dressing Dry Gauze Roll Gauze Notes did not apply coban related to patient going straight to [facility] for a DVT rule out of right leg. Electronic Signature(s) Signed: 11/07/2020 4:55:08 PM By: Baruch Gouty RN, BSN Signed: 11/07/2020 6:16:40 PM By: Levan Hurst RN, BSN Entered By: Baruch Gouty on 11/07/2020 13:42:28 -------------------------------------------------------------------------------- Vitals Details Patient Name: Date of Service: [name], [place] [employer] F. 11/07/2020 1:00 PM Medical Record Number: [id number] Patient Account Number: [id number] Date of Birth/Sex: Treating RN: 06-06-1951 (69 y.o. [name] [name] Primary Care Roey Coopman: [name] Other Clinician: Referring Lianni Kanaan: Treating Tyra Michelle/Extender: [name] in Treatment: 450 Vital Signs Time Taken: 12:59 Temperature (F): 98.1 Height (in): 68 Pulse (bpm): 81 Weight (lbs): 132 Respiratory Rate (breaths/min): 18 Body Mass Index (BMI): 20.1 Blood Pressure (mmHg): 190/70 Reference Range: 80 - 120 mg / dl Electronic Signature(s) Signed: 11/07/2020 4:07:04 PM By: Sandre Kitty Entered By: Sandre Kitty on 11/07/2020 13:00:12

## 2020-11-08 NOTE — Telephone Encounter (Signed)
Pt left VM saying she had questions about taking xarelto with plavix.  I called pt back no answer/left vm requesting return call.

## 2020-11-14 ENCOUNTER — Encounter (HOSPITAL_BASED_OUTPATIENT_CLINIC_OR_DEPARTMENT_OTHER): Payer: Medicare Other | Admitting: Internal Medicine

## 2020-11-14 ENCOUNTER — Other Ambulatory Visit: Payer: Self-pay

## 2020-11-14 DIAGNOSIS — I83222 Varicose veins of left lower extremity with both ulcer of calf and inflammation: Secondary | ICD-10-CM | POA: Diagnosis not present

## 2020-11-14 DIAGNOSIS — L89616 Pressure-induced deep tissue damage of right heel: Secondary | ICD-10-CM | POA: Diagnosis not present

## 2020-11-14 DIAGNOSIS — L97811 Non-pressure chronic ulcer of other part of right lower leg limited to breakdown of skin: Secondary | ICD-10-CM | POA: Diagnosis not present

## 2020-11-14 DIAGNOSIS — I872 Venous insufficiency (chronic) (peripheral): Secondary | ICD-10-CM | POA: Diagnosis not present

## 2020-11-14 DIAGNOSIS — L97312 Non-pressure chronic ulcer of right ankle with fat layer exposed: Secondary | ICD-10-CM | POA: Diagnosis not present

## 2020-11-14 DIAGNOSIS — I87331 Chronic venous hypertension (idiopathic) with ulcer and inflammation of right lower extremity: Secondary | ICD-10-CM | POA: Diagnosis not present

## 2020-11-14 DIAGNOSIS — L89623 Pressure ulcer of left heel, stage 3: Secondary | ICD-10-CM | POA: Diagnosis not present

## 2020-11-14 DIAGNOSIS — L97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: Secondary | ICD-10-CM | POA: Diagnosis not present

## 2020-11-14 DIAGNOSIS — I70243 Atherosclerosis of native arteries of left leg with ulceration of ankle: Secondary | ICD-10-CM | POA: Diagnosis not present

## 2020-11-14 DIAGNOSIS — L97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: Secondary | ICD-10-CM | POA: Diagnosis not present

## 2020-11-15 NOTE — Progress Notes (Signed)
[name], [name] ([id number]) Visit Report for 11/14/2020 Arrival Information Details Patient Name: Date of Service: [name] [name] [name] [name] 11/14/2020 11:15 A M Medical Record Number: [id number] Patient Account Number: [id number] Date of Birth/Sex: Treating RN: July 24, 1951 (69 y.o. [name] [name], [name] [name] Primary Care [name] [name]: [name] [name] Other Clinician: Referring Sho Salguero: Treating [name] [name]/Extender: [name] [name] in Treatment: 68 Visit Information History Since Last Visit Added or deleted any medications: Yes Patient Arrived: Wheel Chair Any new allergies or adverse reactions: No Arrival Time: 11:23 Had a fall or experienced change in No Accompanied By: spouse activities of daily living that may affect Transfer Assistance: None risk of falls: Patient Identification Verified: Yes Signs or symptoms of abuse/neglect since last visito No Secondary Verification Process Completed: Yes Hospitalized since last visit: No Patient Requires Transmission-Based Precautions: No Implantable device outside of the clinic excluding No Patient Has Alerts: Yes cellular tissue based products placed in the center Patient Alerts: R ABI: [place] TBI: 0.41 since last visit: L ABI: [place] (07/2020) Pain Present Now: Yes Electronic Signature(s) Signed: 11/15/2020 5:21:16 PM By: Baruch Gouty RN, BSN Entered By: Baruch Gouty on 11/14/2020 11:31:15 -------------------------------------------------------------------------------- Lower Extremity Assessment Details Patient Name: Date of Service: [name] [name] CK, [name] 11/14/2020 11:15 A M Medical Record Number: [id number] Patient Account Number: [id number] Date of Birth/Sex: Treating RN: [birth year] (69 y.o. [name] [name] Primary Care [name] [name]: [name] [name] Other Clinician: Referring [name] [name]: Treating [name] [name]/Extender: [name] [name] in Treatment: 451 Edema Assessment Assessed: [name]: No] [Right: No] Edema: [Left:  Yes] [Right: Yes] Calf Left: Right: Point of Measurement: 36 cm From Medial Instep 31.7 cm 34.5 cm Ankle Left: Right: Point of Measurement: 10 cm From Medial Instep 21 cm 21.8 cm Vascular Assessment Pulses: Dorsalis Pedis Palpable: [Left:No] [Right:Yes] Electronic Signature(s) Signed: 11/15/2020 5:21:16 PM By: Baruch Gouty RN, BSN Entered By: Baruch Gouty on 11/14/2020 11:42:31 -------------------------------------------------------------------------------- Multi Wound Chart Details Patient Name: Date of Service: [name] [name] 11/14/2020 11:15 A M Medical Record Number: [id number] Patient Account Number: [id number] Date of Birth/Sex: Treating RN: 1951-02-14 (69 y.o. 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] Primary Etiology: [16:Anemia, Hypertension, Peripheral] [19:Anemia, Hypertension, Peripheral] [20:Anemia, Hypertension, Peripheral] Comorbid History: [16:Arterial Disease, Peripheral Venous Arterial Disease, Peripheral Venous Arterial Disease, Peripheral Venous Disease, Raynauds, Scleroderma, Rheumatoid Arthritis, Osteoarthritis Rheumatoid Arthritis, Osteoarthritis Rheumatoid  Arthritis, Osteoarthritis 01/04/2020] [19:Disease, Raynauds, Scleroderma, Disease, Raynauds, Scleroderma, 05/02/2020] [20:05/02/2020] Date Acquired: [16:45] [19:28] [20:28] Weeks of Treatment: [16:Open]  [19:Open] [20:Open] Wound Status: [16:No] [19:No] [20:No] Clustered Wound: [16:0.6x1.5x0.9] [19:0.3x0.2x0.2] [20:1x0.8x0.1] Measurements L x W x D (cm) [16:0.707] [19:0.047] [20:0.628] A (cm) : rea [16:0.636] [19:0.009] [20:0.063] Volume (cm) : [16:94.40%] [19:66.70%] [20:-42.70%] % Reduction in Area: [16:49.40%] [19:67.90%] [20:28.40%] % Reduction in Volume: [16:Category/Stage III] [19:Full Thickness Without Exposed] [20:Full Thickness Without Exposed] Classification: [16:Small] [19:Support Structures Small] [20:Support Structures None Present] Exudate Amount: [16:Serosanguineous] [19:Serous] [20:N/A] Exudate Type: [16:red, brown] [19:amber] [20:N/A] Exudate Color: [16:Distinct, outline attached] [19:Distinct, outline attached] [20:Distinct, outline attached] Wound Margin: [16:Small (1-33%)] [19:None Present (0%)] [20:Medium (34-66%)] Granulation Amount: [16:Pink] [19:N/A] [20:Red] Granulation Quality: [16:Large (  67-100%)] [19:Large (67-100%)] [20:Medium (34-66%)] Necrotic Amount: [16:Fat Layer (Subcutaneous Tissue): Yes Fat Layer (Subcutaneous Tissue): Yes Fat Layer (Subcutaneous Tissue): Yes] Exposed Structures: [16:Fascia: No Tendon: No Muscle: No Joint: No Bone: No Small (1-33%)] [19:Fascia: No Tendon: No Muscle: No Joint: No Bone: No None] [20:Fascia: No Tendon: No Muscle: No Joint: No Bone: No Small (1-33%)] Epithelialization: [16:N/A] [19:N/A] [20:N/A] Wound Number: _0 Photos: No Photos No Photos No Photos Right, Posterior Lower Leg Right, Anterior Lower Leg Left, Lateral Foot Wound Location: Gradually Appeared Gradually Appeared Gradually Appeared 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 Primary Etiology: Anemia, Hypertension, Peripheral Anemia, Hypertension, Peripheral Anemia, Hypertension, Peripheral Comorbid History: Arterial Disease, Peripheral Venous Arterial Disease, Peripheral Venous Arterial Disease, Peripheral Venous Disease,  Raynauds, Scleroderma, Disease, Raynauds, Scleroderma, Disease, Raynauds, Scleroderma, Rheumatoid Arthritis, Osteoarthritis Rheumatoid Arthritis, Osteoarthritis Rheumatoid Arthritis, Osteoarthritis 08/15/2020 10/10/2020 10/10/2020 Date Acquired: _1 Weeks of Treatment: Open Open Open Wound Status: No No No Clustered Wound: 2.2x2.9x0.4 1.4x0.9x0.4 0.8x0.7x0.1 Measurements L x W x D (cm) 5.011 0.99 0.44 A (cm) : rea 2.004 0.396 0.044 Volume (cm) : -346.20% 18.20% -16.70% % Reduction in Area: -1689.30% -9.10% -15.80% % Reduction in Volume: Full Thickness Without Exposed Full Thickness Without Exposed Unclassifiable Classification: Support Structures Support Structures Medium Small None Present Exudate Amount: Purulent Serous N/A Exudate Type: yellow, brown, green amber N/A Exudate Color: Distinct, outline attached Distinct, outline attached Distinct, outline attached Wound Margin: None Present (0%) Small (1-33%) None Present (0%) Granulation Amount: N/A Pink N/A Granulation Quality: Large (67-100%) Large (67-100%) Large (67-100%) Necrotic Amount: Fat Layer (Subcutaneous Tissue): Yes Fat Layer (Subcutaneous Tissue): Yes Fat Layer (Subcutaneous Tissue): Yes Exposed Structures: Fascia: No Fascia: No Fascia: No Tendon: No Tendon: No Tendon: No Muscle: No Muscle: No Muscle: No Joint: No Joint: No Joint: No Bone: No Bone: No Bone: No None None Medium (34-66%) Epithelialization: N/A N/A scabbed Assessment Notes: Wound Number: _2 Photos: No Photos No Photos No Photos Right, Lateral Lower Leg Right, Distal, Anterior Lower Leg Right, Medial Lower Leg Wound Location: Gradually Appeared Gradually Appeared Gradually Appeared 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 Primary Etiology: Anemia, Hypertension, Peripheral Anemia, Hypertension, Peripheral Anemia, Hypertension, Peripheral Comorbid History: Arterial Disease, Peripheral  Venous Arterial Disease, Peripheral Venous Arterial Disease, Peripheral Venous Disease, Raynauds, Scleroderma, Disease, Raynauds, Scleroderma, Disease, Raynauds, Scleroderma, Rheumatoid Arthritis, Osteoarthritis Rheumatoid Arthritis, Osteoarthritis Rheumatoid Arthritis, Osteoarthritis 10/27/2020 11/07/2020 01/13/2013 Date Acquired: 2 1 407 Weeks of Treatment: Open Open Open Wound Status: No No Yes Clustered Wound: 1.4x0.9x0.4 0.5x0.7x0.2 1.9x2.4x0.4 Measurements L x W x D (cm) 0.99 0.275 3.581 A (cm) : rea 0.396 0.055 1.433 Volume (cm) : -100.00% 2.80% -153.30% % Reduction in Area: -167.60% -96.40% -916.30% % Reduction in Volume: Full Thickness Without Exposed Full Thickness Without Exposed Full Thickness With Exposed Support Classification: Support Structures Support Structures Structures Medium Small Medium Exudate Amount: Purulent Serous Serous Exudate Type: yellow, brown, green amber amber Exudate Color: Flat and Intact Flat and Intact Distinct, outline attached Wound Margin: None Present (0%) Small (1-33%) Small (1-33%) Granulation Amount: N/A Pink Pink Granulation Quality: Large (67-100%) Large (67-100%) Large (67-100%) Necrotic Amount: Fat Layer (Subcutaneous Tissue): Yes Fat Layer (Subcutaneous Tissue): Yes Fat Layer (Subcutaneous Tissue): Yes Exposed Structures: Fascia: No Fascia: No Fascia: No Tendon: No Tendon: No Tendon: No Muscle: No Muscle: No Muscle: No Joint: No Joint: No Joint: No Bone: No Bone: No Bone: No None Small (1-33%) Small (1-33%) Epithelialization: N/A N/A  N/A Assessment Notes: Treatment Notes Electronic Signature(s) Signed: 11/14/2020 5:39:18 PM By: Linton Ham MD Signed: 11/14/2020 5:51:53 PM By: Levan Hurst RN, BSN Entered By: Linton Ham on 11/14/2020 12:54:25 -------------------------------------------------------------------------------- Multi-Disciplinary Care Plan Details Patient Name: Date of  Service: [name] [name] CK, [name] 11/14/2020 11:15 A M Medical Record Number: [id number] Patient Account Number: [id number] Date of Birth/Sex: Treating RN: October 30, 1951 (69 y.o. [name] [name] Primary Care Ferguson Gertner: [name] [name] Other Clinician: Referring Alita Waldren: Treating Tramaine Snell/Extender: [name] [name] in Treatment: 451 Active Inactive Wound/Skin Impairment Nursing Diagnoses: Impaired tissue integrity Knowledge deficit related to ulceration/compromised skin integrity Goals: Patient/caregiver will verbalize understanding of skin care regimen Date Initiated: 10/11/2016 Target Resolution Date: 12/09/2020 Goal Status: Active Interventions: Assess patient/caregiver ability to obtain necessary supplies Assess patient/caregiver ability to perform ulcer/skin care regimen upon admission and as needed Assess ulceration(s) every visit Provide education on ulcer and skin care Treatment Activities: Skin care regimen initiated : 10/11/2016 Topical wound management initiated : 10/11/2016 Notes: Electronic Signature(s) Signed: 11/14/2020 5:51:53 PM By: Levan Hurst RN, BSN Entered By: Levan Hurst on 11/14/2020 14:14:30 -------------------------------------------------------------------------------- Pain Assessment Details Patient Name: Date of Service: [name] [name] 11/14/2020 11:15 A M Medical Record Number: [id number] Patient Account Number: [id number] Date of Birth/Sex: Treating RN: 05/11/1951 (69 y.o. [name] [name] Primary Care Adele Milson: [name] [name] Other Clinician: Referring Amador Braddy: Treating Burnis Halling/Extender: [name] [name] in Treatment: 451 Active Problems Location of Pain Severity and Description of Pain Patient Has Paino Yes Site Locations Pain Location: Pain in Ulcers With Dressing Change: Yes Duration of the Pain. Constant / Intermittento Intermittent Rate the pain. Current Pain Level: 5 Worst Pain Level:  8 Least Pain Level: 0 Character of Pain Describe the Pain: Burning Pain Management and Medication Current Pain Management: Medication: Yes Is the Current Pain Management Adequate: Adequate How does your wound impact your activities of daily livingo Sleep: Yes Bathing: No Appetite: No Relationship With Others: No Bladder Continence: No Emotions: Yes Bowel Continence: No Hobbies: No Toileting: No Dressing: No Electronic Signature(s) Signed: 11/15/2020 5:21:16 PM By: Baruch Gouty RN, BSN Entered By: Baruch Gouty on 11/14/2020 11:37:51 -------------------------------------------------------------------------------- Patient/Caregiver Education Details Patient Name: Date of Service: [name] [name] 11/15/2021andnbsp11:15 A M Medical Record Number: [id number] Patient Account Number: [id number] Date of Birth/Gender: Treating RN: May 09, 1951 (69 y.o. [name] [name] Primary Care Physician: [name] [name] Other Clinician: Referring Physician: Treating Physician/Extender: [name] [name] in Treatment: 451 Education Assessment Education Provided To: Patient Education Topics Provided Wound/Skin Impairment: Methods: Explain/Verbal Responses: State content correctly [employer]) Signed: 11/14/2020 5:51:53 PM By: Levan Hurst RN, BSN Entered By: Levan Hurst on 11/14/2020 14:15:00 -------------------------------------------------------------------------------- Wound Assessment Details Patient Name: Date of Service: [name] [name] 11/14/2020 11:15 A M Medical Record Number: [id number] Patient Account Number: [id number] Date of Birth/Sex: Treating RN: Sep 12, 1951 (69 y.o. [name] [name] Primary Care Sonia Stickels: [name] [name] Other Clinician: Referring Jadelynn Boylan: Treating Liala Codispoti/Extender: [name] [name] in Treatment: [place] Wound Status Wound Number: 16 Primary Pressure Ulcer Etiology: Wound Location: Right  Calcaneus Wound Open Wounding Event: Pressure Injury Status: Date Acquired: 01/04/2020 Comorbid Anemia, Hypertension, Peripheral Arterial Disease, Peripheral Weeks Of Treatment: 45 History: Venous Disease, Raynauds, Scleroderma, Rheumatoid Arthritis, Clustered Wound: No Osteoarthritis Photos Photo Uploaded By: Mikeal Hawthorne on 11/15/2020 14:38:39 Wound Measurements Length: (cm) 0.6 Width: (cm) 1.5 Depth: (cm) 0.9 Area: (cm) 0.707 Volume: (cm) 0.636 % Reduction in Area: 94.4% %  Reduction in Volume: 49.4% Epithelialization: Small (1-33%) Tunneling: No Undermining: No Wound Description Classification: Category/Stage III Wound Margin: Distinct, outline attached Exudate Amount: Small Exudate Type: Serosanguineous Exudate Color: red, brown Foul Odor After Cleansing: No Slough/Fibrino Yes Wound Bed Granulation Amount: Small (1-33%) Exposed Structure Granulation Quality: Pink Fascia Exposed: No Necrotic Amount: Large (67-100%) Fat Layer (Subcutaneous Tissue) Exposed: Yes Necrotic Quality: Adherent Slough Tendon Exposed: No Muscle Exposed: No Joint Exposed: No Bone Exposed: No Electronic Signature(s) Signed: 11/15/2020 5:21:16 PM By: Baruch Gouty RN, BSN Entered By: Baruch Gouty on 11/14/2020 12:10:28 -------------------------------------------------------------------------------- Wound Assessment Details Patient Name: Date of Service: [name] [name] 11/14/2020 11:15 A M Medical Record Number: [id number] Patient Account Number: [id number] Date of Birth/Sex: Treating RN: 12-22-1951 (69 y.o. [name] [name], [name] [name] Primary Care Effie Janoski: [name] [name] Other Clinician: Referring Sakina Briones: Treating Blaire Palomino/Extender: [name] [name] in Treatment: [place] Wound Status Wound Number: 19 Primary Venous Leg Ulcer Etiology: Wound Location: Right, Lateral Malleolus Wound Open Wounding Event: Gradually Appeared Status: Date Acquired: 05/02/2020 Comorbid  Anemia, Hypertension, Peripheral Arterial Disease, Peripheral Weeks Of Treatment: 28 History: Venous Disease, Raynauds, Scleroderma, Rheumatoid Arthritis, Clustered Wound: No Osteoarthritis Photos Photo Uploaded By: Mikeal Hawthorne on 11/15/2020 14:13:05 Wound Measurements Length: (cm) 0.3 Width: (cm) 0.2 Depth: (cm) 0.2 Area: (cm) 0.047 Volume: (cm) 0.009 % Reduction in Area: 66.7% % Reduction in Volume: 67.9% Epithelialization: None Tunneling: No Undermining: No Wound Description Classification: Full Thickness Without Exposed Support Structures Wound Margin: Distinct, outline attached Exudate Amount: Small Exudate Type: Serous Exudate Color: amber Foul Odor After Cleansing: No Slough/Fibrino Yes Wound Bed Granulation Amount: None Present (0%) Exposed Structure Necrotic Amount: Large (67-100%) Fascia Exposed: No Necrotic Quality: Adherent Slough Fat Layer (Subcutaneous Tissue) Exposed: Yes Tendon Exposed: No Muscle Exposed: No Joint Exposed: No Bone Exposed: No Electronic Signature(s) Signed: 11/15/2020 5:21:16 PM By: Baruch Gouty RN, BSN Entered By: Baruch Gouty on 11/14/2020 12:11:26 -------------------------------------------------------------------------------- Wound Assessment Details Patient Name: Date of Service: [name] [name] 11/14/2020 11:15 A M Medical Record Number: [id number] Patient Account Number: [id number] Date of Birth/Sex: Treating RN: April 03, 1951 (69 y.o. [name] [name], [name] [name] Primary Care Sicilia Killough: [name] [name] Other Clinician: Referring Margrette Wynia: Treating Margrit Minner/Extender: [name] [name] in Treatment: [place] Wound Status Wound Number: 20 Primary Arterial Insufficiency Ulcer Etiology: Wound Location: Left, Lateral Malleolus Wound Open Wounding Event: Gradually Appeared Status: Date Acquired: 05/02/2020 Comorbid Anemia, Hypertension, Peripheral Arterial Disease, Peripheral Weeks Of Treatment: 28 History: Venous  Disease, Raynauds, Scleroderma, Rheumatoid Arthritis, Clustered Wound: No Osteoarthritis Photos Photo Uploaded By: Mikeal Hawthorne on 11/15/2020 14:11:55 Wound Measurements Length: (cm) 1 Width: (cm) 0.8 Depth: (cm) 0.1 Area: (cm) 0.628 Volume: (cm) 0.063 % Reduction in Area: -42.7% % Reduction in Volume: 28.4% Epithelialization: Small (1-33%) Tunneling: No Undermining: No Wound Description Classification: Full Thickness Without Exposed Support Structures Wound Margin: Distinct, outline attached Exudate Amount: None Present Foul Odor After Cleansing: No Slough/Fibrino Yes Wound Bed Granulation Amount: Medium (34-66%) Exposed Structure Granulation Quality: Red Fascia Exposed: No Necrotic Amount: Medium (34-66%) Fat Layer (Subcutaneous Tissue) Exposed: Yes Necrotic Quality: Adherent Slough Tendon Exposed: No Muscle Exposed: No Joint Exposed: No Bone Exposed: No Electronic Signature(s) Signed: 11/15/2020 5:21:16 PM By: Baruch Gouty RN, BSN Entered By: Baruch Gouty on 11/14/2020 12:12:01 -------------------------------------------------------------------------------- Wound Assessment Details Patient Name: Date of Service: [name] [name] 11/14/2020 11:15 A M Medical Record Number: [id number] Patient Account Number: [id number] Date of Birth/Sex: Treating RN: 11/05/1951 (69 y.o. F) Boehlein,  Vaughan Basta Primary Care Jatniel Verastegui: [name] [name] Other Clinician: Referring Vanisha Whiten: Treating Willy Vorce/Extender: [name] [name] in Treatment: [place] Wound Status Wound Number: 23 Primary Venous Leg Ulcer Etiology: Wound Location: Right, Posterior Lower Leg Wound Open Wounding Event: Gradually Appeared Status: Date Acquired: 08/15/2020 Comorbid Anemia, Hypertension, Peripheral Arterial Disease, Peripheral Weeks Of Treatment: 13 History: Venous Disease, Raynauds, Scleroderma, Rheumatoid Arthritis, Clustered Wound: No Osteoarthritis Photos Photo Uploaded  By: Mikeal Hawthorne on 11/15/2020 14:13:06 Wound Measurements Length: (cm) 2.2 Width: (cm) 2.9 Depth: (cm) 0.4 Area: (cm) 5.011 Volume: (cm) 2.004 % Reduction in Area: -346.2% % Reduction in Volume: -1689.3% Epithelialization: None Tunneling: No Undermining: No Wound Description Classification: Full Thickness Without Exposed Support Structures Wound Margin: Distinct, outline attached Exudate Amount: Medium Exudate Type: Purulent Exudate Color: yellow, brown, green Foul Odor After Cleansing: No Slough/Fibrino Yes Wound Bed Granulation Amount: None Present (0%) Exposed Structure Necrotic Amount: Large (67-100%) Fascia Exposed: No Necrotic Quality: Adherent Slough Fat Layer (Subcutaneous Tissue) Exposed: Yes Tendon Exposed: No Muscle Exposed: No Joint Exposed: No Bone Exposed: No Electronic Signature(s) Signed: 11/15/2020 5:21:16 PM By: Baruch Gouty RN, BSN Entered By: Baruch Gouty on 11/14/2020 12:12:57 -------------------------------------------------------------------------------- Wound Assessment Details Patient Name: Date of Service: [name] [name] 11/14/2020 11:15 A M Medical Record Number: [id number] Patient Account Number: [id number] Date of Birth/Sex: Treating RN: December 17, 1951 (69 y.o. [name] [name], [name] [name] Primary Care Kohei Antonellis: [name] [name] Other Clinician: Referring Jenalee Trevizo: Treating Tasheema Perrone/Extender: [name] [name] in Treatment: [place] Wound Status Wound Number: 24 Primary Venous Leg Ulcer Etiology: Wound Location: Right, Anterior Lower Leg Wound Open Wounding Event: Gradually Appeared Status: Date Acquired: 10/10/2020 Comorbid Anemia, Hypertension, Peripheral Arterial Disease, Peripheral Weeks Of Treatment: 5 History: Venous Disease, Raynauds, Scleroderma, Rheumatoid Arthritis, Clustered Wound: No Osteoarthritis Photos Photo Uploaded By: Mikeal Hawthorne on 11/15/2020 14:38:07 Wound Measurements Length: (cm) 1.4 Width:  (cm) 0.9 Depth: (cm) 0.4 Area: (cm) 0.99 Volume: (cm) 0.396 % Reduction in Area: 18.2% % Reduction in Volume: -9.1% Epithelialization: None Tunneling: No Undermining: No Wound Description Classification: Full Thickness Without Exposed Support Structures Wound Margin: Distinct, outline attached Exudate Amount: Small Exudate Type: Serous Exudate Color: amber Foul Odor After Cleansing: No Slough/Fibrino Yes Wound Bed Granulation Amount: Small (1-33%) Exposed Structure Granulation Quality: Pink Fascia Exposed: No Necrotic Amount: Large (67-100%) Fat Layer (Subcutaneous Tissue) Exposed: Yes Necrotic Quality: Adherent Slough Tendon Exposed: No Muscle Exposed: No Joint Exposed: No Bone Exposed: No Electronic Signature(s) Signed: 11/15/2020 5:21:16 PM By: Baruch Gouty RN, BSN Entered By: Baruch Gouty on 11/14/2020 12:13:27 -------------------------------------------------------------------------------- Wound Assessment Details Patient Name: Date of Service: [name] [name] 11/14/2020 11:15 A M Medical Record Number: [id number] Patient Account Number: [id number] Date of Birth/Sex: Treating RN: 17-Mar-1951 (69 y.o. [name] [name], [name] [name] Primary Care Ojas Coone: [name] [name] Other Clinician: Referring Caedence Snowden: Treating Jahi Roza/Extender: [name] [name] in Treatment: 451 Wound Status Wound Number: 25 Primary Venous Leg Ulcer Etiology: Wound Location: Left, Lateral Foot Wound Open Wounding Event: Gradually Appeared Status: Date Acquired: 10/10/2020 Comorbid Anemia, Hypertension, Peripheral Arterial Disease, Peripheral Weeks Of Treatment: 5 History: Venous Disease, Raynauds, Scleroderma, Rheumatoid Arthritis, Clustered Wound: No Osteoarthritis Photos Photo Uploaded By: Mikeal Hawthorne on 11/15/2020 14:11:36 Wound Measurements Length: (cm) 0.8 Width: (cm) 0.7 Depth: (cm) 0.1 Area: (cm) 0.44 Volume: (cm) 0.044 % Reduction in Area: -16.7% %  Reduction in Volume: -15.8% Epithelialization: Medium (34-66%) Tunneling: No Undermining: No Wound Description Classification: Unclassifiable Wound Margin: Distinct, outline attached Exudate Amount: None Present Foul Odor  After Cleansing: No Slough/Fibrino Yes Wound Bed Granulation Amount: None Present (0%) Exposed Structure Necrotic Amount: Large (67-100%) Fascia Exposed: No Necrotic Quality: Adherent Slough Fat Layer (Subcutaneous Tissue) Exposed: Yes Tendon Exposed: No Muscle Exposed: No Joint Exposed: No Bone Exposed: No Assessment Notes scabbed Electronic Signature(s) Signed: 11/15/2020 5:21:16 PM By: Baruch Gouty RN, BSN Entered By: Baruch Gouty on 11/14/2020 12:14:43 -------------------------------------------------------------------------------- Wound Assessment Details Patient Name: Date of Service: [name] [name] 11/14/2020 11:15 A M Medical Record Number: [id number] Patient Account Number: [id number] Date of Birth/Sex: Treating RN: 15-Jan-1951 (69 y.o. [name] [name], [name] [name] Primary Care Gracelynn Bircher: [name] [name] Other Clinician: Referring Anes [place]: Treating Maxmillian Carsey/Extender: [name] [name] in Treatment: [place] Wound Status Wound Number: 26 Primary Venous Leg Ulcer Etiology: Wound Location: Right, Lateral Lower Leg Wound Open Wounding Event: Gradually Appeared Status: Date Acquired: 10/27/2020 Comorbid Anemia, Hypertension, Peripheral Arterial Disease, Peripheral Weeks Of Treatment: 2 History: Venous Disease, Raynauds, Scleroderma, Rheumatoid Arthritis, Clustered Wound: No Osteoarthritis Photos Photo Uploaded By: Mikeal Hawthorne on 11/15/2020 14:38:08 Wound Measurements Length: (cm) 1.4 Width: (cm) 0.9 Depth: (cm) 0.4 Area: (cm) 0.99 Volume: (cm) 0.396 % Reduction in Area: -100% % Reduction in Volume: -167.6% Epithelialization: None Tunneling: No Undermining: No Wound Description Classification: Full Thickness Without  Exposed Support Structures Wound Margin: Flat and Intact Exudate Amount: Medium Exudate Type: Purulent Exudate Color: yellow, brown, green Foul Odor After Cleansing: No Slough/Fibrino Yes Wound Bed Granulation Amount: None Present (0%) Exposed Structure Necrotic Amount: Large (67-100%) Fascia Exposed: No Necrotic Quality: Adherent Slough Fat Layer (Subcutaneous Tissue) Exposed: Yes Tendon Exposed: No Muscle Exposed: No Joint Exposed: No Bone Exposed: No Electronic Signature(s) Signed: 11/15/2020 5:21:16 PM By: Baruch Gouty RN, BSN Entered By: Baruch Gouty on 11/14/2020 12:15:11 -------------------------------------------------------------------------------- Wound Assessment Details Patient Name: Date of Service: [name] [name] 11/14/2020 11:15 A M Medical Record Number: [id number] Patient Account Number: [id number] Date of Birth/Sex: Treating RN: April 27, 1951 (69 y.o. [name] [name], [name] [name] Primary Care Usiel Astarita: [name] [name] Other Clinician: Referring Ashantae Pangallo: Treating Leslea Vowles/Extender: [name] [name] in Treatment: [place] Wound Status Wound Number: 27 Primary Venous Leg Ulcer Etiology: Wound Location: Right, Distal, Anterior Lower Leg Wound Open Wounding Event: Gradually Appeared Status: Date Acquired: 11/07/2020 Comorbid Anemia, Hypertension, Peripheral Arterial Disease, Peripheral Weeks Of Treatment: 1 History: Venous Disease, Raynauds, Scleroderma, Rheumatoid Arthritis, Clustered Wound: No Osteoarthritis Photos Photo Uploaded By: Mikeal Hawthorne on 11/15/2020 14:38:41 Wound Measurements Length: (cm) 0.5 Width: (cm) 0.7 Depth: (cm) 0.2 Area: (cm) 0.275 Volume: (cm) 0.055 % Reduction in Area: 2.8% % Reduction in Volume: -96.4% Epithelialization: Small (1-33%) Tunneling: No Undermining: No Wound Description Classification: Full Thickness Without Exposed Support Structures Wound Margin: Flat and Intact Exudate Amount:  Small Exudate Type: Serous Exudate Color: amber Foul Odor After Cleansing: No Slough/Fibrino Yes Wound Bed Granulation Amount: Small (1-33%) Exposed Structure Granulation Quality: Pink Fascia Exposed: No Necrotic Amount: Large (67-100%) Fat Layer (Subcutaneous Tissue) Exposed: Yes Necrotic Quality: Adherent Slough Tendon Exposed: No Muscle Exposed: No Joint Exposed: No Bone Exposed: No Electronic Signature(s) Signed: 11/15/2020 5:21:16 PM By: Baruch Gouty RN, BSN Entered By: Baruch Gouty on 11/14/2020 12:15:35 -------------------------------------------------------------------------------- Wound Assessment Details Patient Name: Date of Service: [name] [name] 11/14/2020 11:15 A M Medical Record Number: [id number] Patient Account Number: [id number] Date of Birth/Sex: Treating RN: 1951/03/30 (69 y.o. [name] [name] Primary Care Wynnie Pacetti: [name] [name] Other Clinician: Referring Floyd Lusignan: Treating Olar Santini/Extender: [name] [name] in Treatment: 451 Wound Status  Wound Number: 5 Primary Venous Leg Ulcer Etiology: Wound Location: Right, Medial Lower Leg Wound Open Wounding Event: Gradually Appeared Status: Date Acquired: 01/13/2013 Comorbid Anemia, Hypertension, Peripheral Arterial Disease, Peripheral Weeks Of Treatment: 407 History: Venous Disease, Raynauds, Scleroderma, Rheumatoid Arthritis, Clustered Wound: Yes Osteoarthritis Photos Photo Uploaded By: Mikeal Hawthorne on 11/15/2020 14:38:55 Wound Measurements Length: (cm) 1.9 Width: (cm) 2.4 Depth: (cm) 0.4 Area: (cm) 3.581 Volume: (cm) 1.433 % Reduction in Area: -153.3% % Reduction in Volume: -916.3% Epithelialization: Small (1-33%) Tunneling: No Undermining: No Wound Description Classification: Full Thickness With Exposed Support Structures Wound Margin: Distinct, outline attached Exudate Amount: Medium Exudate Type: Serous Exudate Color: amber Foul Odor After Cleansing:  No Slough/Fibrino Yes Wound Bed Granulation Amount: Small (1-33%) Exposed Structure Granulation Quality: Pink Fascia Exposed: No Necrotic Amount: Large (67-100%) Fat Layer (Subcutaneous Tissue) Exposed: Yes Necrotic Quality: Adherent Slough Tendon Exposed: No Muscle Exposed: No Joint Exposed: No Bone Exposed: No Electronic Signature(s) Signed: 11/15/2020 5:21:16 PM By: Baruch Gouty RN, BSN Entered By: Baruch Gouty on 11/14/2020 12:16:22 -------------------------------------------------------------------------------- Vitals Details Patient Name: Date of Service: [name] [name] CK, [name] 11/14/2020 11:15 A M Medical Record Number: [id number] Patient Account Number: [id number] Date of Birth/Sex: Treating RN: 05/05/1951 (69 y.o. [name] [name] Primary Care Iriel Nason: [name] [name] Other Clinician: Referring Johnothan Bascomb: Treating Elizabet Schweppe/Extender: [name] [name] in Treatment: 451 Vital Signs Time Taken: 11:34 Temperature (F): 98.6 Height (in): 68 Pulse (bpm): 75 Source: Stated Respiratory Rate (breaths/min): 18 Weight (lbs): 132 Blood Pressure (mmHg): 160/63 Source: Stated Reference Range: 80 - 120 mg / dl Body Mass Index (BMI): 20.1 Electronic Signature(s) Signed: 11/15/2020 5:21:16 PM By: Baruch Gouty RN, BSN Entered By: Baruch Gouty on 11/14/2020 11:36:03

## 2020-11-15 NOTE — Progress Notes (Signed)
[name] [name] ([id number]) Visit Report for 11/14/2020 Debridement Details Patient Name: Date of Service: [name] 11/14/2020 11:15 A M Medical Record Number: [id number] Patient Account Number: [id number] Date of Birth/Sex: Treating RN: Jan 30, 1951 (69 y.o. [name] [name] Primary Care Provider: Tedra [name] Other Clinician: Referring Provider: Treating Provider/Extender: [name] [name] in Treatment: 451 Debridement Performed for Assessment: Wound #16 Right Calcaneus Performed By: Clinician [name] Pilling, RN Debridement Type: Chemical/Enzymatic/Mechanical Agent Used: Santyl Level of Consciousness (Pre-procedure): Awake and Alert Pre-procedure Verification/Time Out No Taken: Start Time: 13:05 Bleeding: None End Time: 13:05 Procedural Pain: 0 Post Procedural Pain: 0 Response to Treatment: Procedure was tolerated well Level of Consciousness (Post- Awake and Alert procedure): Post Debridement Measurements of Total Wound Length: (cm) 0.6 Stage: Category/Stage III Width: (cm) 1.5 Depth: (cm) 0.9 Volume: (cm) 0.636 Character of Wound/Ulcer Post Debridement: Requires Further Debridement Post Procedure Diagnosis Same as Pre-procedure Electronic Signature(s) Signed: 11/14/2020 5:39:18 PM By: [name] Ham MD Signed: 11/14/2020 5:51:53 PM By: [name] Hurst RN, BSN Entered By: [name] [name] on 11/14/2020 14:27:07 -------------------------------------------------------------------------------- Debridement Details Patient Name: Date of Service: [name], [name] 11/14/2020 11:15 A M Medical Record Number: [id number] Patient Account Number: [id number] Date of Birth/Sex: Treating RN: 1951-03-16 (69 y.o. [name] [name] Primary Care Provider: Tedra [name] Other Clinician: Referring Provider: Treating Provider/Extender: [name] [name] in Treatment: 451 Debridement Performed for Assessment: Wound #19 Right,Lateral  Malleolus Performed By: Clinician [name] Pilling, RN Debridement Type: Chemical/Enzymatic/Mechanical Agent Used: Santyl Severity of Tissue Pre Debridement: Fat layer exposed Level of Consciousness (Pre-procedure): Awake and Alert Pre-procedure Verification/Time Out No Taken: Start Time: 13:05 Bleeding: None End Time: 13:05 Procedural Pain: 0 Post Procedural Pain: 0 Response to Treatment: Procedure was tolerated well Level of Consciousness (Post- Awake and Alert procedure): Post Debridement Measurements of Total Wound Length: (cm) 0.3 Width: (cm) 0.2 Depth: (cm) 0.2 Volume: (cm) 0.009 Character of Wound/Ulcer Post Debridement: Requires Further Debridement Severity of Tissue Post Debridement: Fat layer exposed Post Procedure Diagnosis Same as Pre-procedure Electronic Signature(s) Signed: 11/14/2020 5:39:18 PM By: [name] Ham MD Signed: 11/14/2020 5:51:53 PM By: [name] Hurst RN, BSN Entered By: [name] [name] on 11/14/2020 14:27:33 -------------------------------------------------------------------------------- Debridement Details Patient Name: Date of Service: [name], [name] 11/14/2020 11:15 A M Medical Record Number: [id number] Patient Account Number: [id number] Date of Birth/Sex: Treating RN: 1951/12/23 (69 y.o. [name] [name] Primary Care Provider: Tedra [name] Other Clinician: Referring Provider: Treating Provider/Extender: [name] [name] in Treatment: 451 Debridement Performed for Assessment: Wound #20 Left,Lateral Malleolus Performed By: Clinician [name] Pilling, RN Debridement Type: Chemical/Enzymatic/Mechanical Agent Used: Santyl Severity of Tissue Pre Debridement: Fat layer exposed Level of Consciousness (Pre-procedure): Awake and Alert Pre-procedure Verification/Time Out No Taken: Start Time: 13:05 Bleeding: None End Time: 13:05 Procedural Pain: 0 Post Procedural Pain: 0 Response to Treatment: Procedure was tolerated  well Level of Consciousness (Post- Awake and Alert procedure): Post Debridement Measurements of Total Wound Length: (cm) 1 Width: (cm) 0.8 Depth: (cm) 0.1 Volume: (cm) 0.063 Character of Wound/Ulcer Post Debridement: Requires Further Debridement Severity of Tissue Post Debridement: Fat layer exposed Post Procedure Diagnosis Same as Pre-procedure Electronic Signature(s) Signed: 11/14/2020 5:39:18 PM By: [name] Ham MD Signed: 11/14/2020 5:51:53 PM By: [name] Hurst RN, BSN Entered By: [name] [name] on 11/14/2020 14:28:02 -------------------------------------------------------------------------------- Debridement Details Patient Name: Date of Service: [name], [name] 11/14/2020 11:15 A M Medical Record Number: [id number] Patient Account  Number: [id number] Date of Birth/Sex: Treating RN: 16-Aug-1951 (69 y.o. [name] [name] Primary Care Provider: Tedra [name] Other Clinician: Referring Provider: Treating Provider/Extender: [name] [name] in Treatment: 451 Debridement Performed for Assessment: Wound #23 Right,Posterior Lower Leg Performed By: Clinician [name] Pilling, RN Debridement Type: Chemical/Enzymatic/Mechanical Agent Used: Santyl Severity of Tissue Pre Debridement: Fat layer exposed Level of Consciousness (Pre-procedure): Awake and Alert Pre-procedure Verification/Time Out No Taken: Start Time: 13:05 Bleeding: None End Time: 13:05 Procedural Pain: 0 Post Procedural Pain: 0 Response to Treatment: Procedure was tolerated well Level of Consciousness (Post- Awake and Alert procedure): Post Debridement Measurements of Total Wound Length: (cm) 2.2 Width: (cm) 2.9 Depth: (cm) 0.4 Volume: (cm) 2.004 Character of Wound/Ulcer Post Debridement: Requires Further Debridement Severity of Tissue Post Debridement: Fat layer exposed Post Procedure Diagnosis Same as Pre-procedure Electronic Signature(s) Signed: 11/14/2020 5:39:18 PM By: [name] Ham MD Signed: 11/14/2020 5:51:53 PM By: [name] Hurst RN, BSN Entered By: [name] [name] on 11/14/2020 14:29:01 -------------------------------------------------------------------------------- Debridement Details Patient Name: Date of Service: [name], [name] 11/14/2020 11:15 A M Medical Record Number: [id number] Patient Account Number: [id number] Date of Birth/Sex: Treating RN: 16-May-1951 (69 y.o. [name] [name] Primary Care Provider: Tedra [name] Other Clinician: Referring Provider: Treating Provider/Extender: [name] [name] in Treatment: 451 Debridement Performed for Assessment: Wound #24 Right,Anterior Lower Leg Performed By: Clinician [name] Pilling, RN Debridement Type: Chemical/Enzymatic/Mechanical Agent Used: Santyl Severity of Tissue Pre Debridement: Fat layer exposed Level of Consciousness (Pre-procedure): Awake and Alert Pre-procedure Verification/Time Out No Taken: Start Time: 13:05 Bleeding: None End Time: 13:05 Procedural Pain: 0 Post Procedural Pain: 0 Response to Treatment: Procedure was tolerated well Level of Consciousness (Post- Awake and Alert procedure): Post Debridement Measurements of Total Wound Length: (cm) 1.4 Width: (cm) 0.9 Depth: (cm) 0.4 Volume: (cm) 0.396 Character of Wound/Ulcer Post Debridement: Requires Further Debridement Severity of Tissue Post Debridement: Fat layer exposed Post Procedure Diagnosis Same as Pre-procedure Electronic Signature(s) Signed: 11/14/2020 5:39:18 PM By: [name] Ham MD Signed: 11/14/2020 5:51:53 PM By: [name] Hurst RN, BSN Entered By: [name] [name] on 11/14/2020 14:40:21 -------------------------------------------------------------------------------- Debridement Details Patient Name: Date of Service: [name], [name] 11/14/2020 11:15 A M Medical Record Number: [id number] Patient Account Number: [id number] Date of Birth/Sex: Treating RN: [birth year] (69 y.o. [name] [name] Primary Care Provider: Tedra [name] Other Clinician: Referring Provider: Treating Provider/Extender: [name] [name] in Treatment: 451 Debridement Performed for Assessment: Wound #25 Left,Lateral Foot Performed By: Clinician [name] Pilling, RN Debridement Type: Chemical/Enzymatic/Mechanical Agent Used: Santyl Severity of Tissue Pre Debridement: Fat layer exposed Level of Consciousness (Pre-procedure): Awake and Alert Pre-procedure Verification/Time Out No Taken: Start Time: 13:05 Bleeding: None End Time: 13:05 Procedural Pain: 0 Post Procedural Pain: 0 Response to Treatment: Procedure was tolerated well Level of Consciousness (Post- Awake and Alert procedure): Post Debridement Measurements of Total Wound Length: (cm) 0.8 Width: (cm) 0.7 Depth: (cm) 0.1 Volume: (cm) 0.044 Character of Wound/Ulcer Post Debridement: Requires Further Debridement Severity of Tissue Post Debridement: Fat layer exposed Post Procedure Diagnosis Same as Pre-procedure Electronic Signature(s) Signed: 11/14/2020 5:39:18 PM By: [name] Ham MD Signed: 11/14/2020 5:51:53 PM By: [name] Hurst RN, BSN Entered By: [name] [name] on 11/14/2020 14:41:37 -------------------------------------------------------------------------------- Debridement Details Patient Name: Date of Service: [name], [name] 11/14/2020 11:15 A M Medical Record Number: [id number] Patient Account Number: [id number] Date of Birth/Sex: Treating RN: 1951/07/31 (69 y.o. [name] [name] Primary Care Provider: Renold Genta,  Stanton Kidney Other Clinician: Referring Provider: Treating Provider/Extender: [name] [name] in Treatment: 451 Debridement Performed for Assessment: Wound #26 Right,Lateral Lower Leg Performed By: Clinician [name] Pilling, RN Debridement Type: Chemical/Enzymatic/Mechanical Agent Used: Santyl Severity of Tissue Pre Debridement: Fat layer exposed Level of  Consciousness (Pre-procedure): Awake and Alert Pre-procedure Verification/Time Out No Taken: Start Time: 13:05 Bleeding: None End Time: 13:05 Procedural Pain: 0 Post Procedural Pain: 0 Response to Treatment: Procedure was tolerated well Level of Consciousness (Post- Awake and Alert procedure): Post Debridement Measurements of Total Wound Length: (cm) 1.4 Width: (cm) 0.9 Depth: (cm) 0.4 Volume: (cm) 0.396 Character of Wound/Ulcer Post Debridement: Requires Further Debridement Severity of Tissue Post Debridement: Fat layer exposed Post Procedure Diagnosis Same as Pre-procedure Electronic Signature(s) Signed: 11/14/2020 5:39:18 PM By: [name] Ham MD Signed: 11/14/2020 5:51:53 PM By: [name] Hurst RN, BSN Entered By: [name] [name] on 11/14/2020 14:42:05 -------------------------------------------------------------------------------- Debridement Details Patient Name: Date of Service: [name], [name] 11/14/2020 11:15 A M Medical Record Number: [id number] Patient Account Number: [id number] Date of Birth/Sex: Treating RN: Nov 22, 1951 (69 y.o. [name] [name] Primary Care Provider: Tedra [name] Other Clinician: Referring Provider: Treating Provider/Extender: [name] [name] in Treatment: 451 Debridement Performed for Assessment: Wound #27 Right,Distal,Anterior Lower Leg Performed By: Clinician [name] Pilling, RN Debridement Type: Chemical/Enzymatic/Mechanical Agent Used: Santyl Severity of Tissue Pre Debridement: Fat layer exposed Level of Consciousness (Pre-procedure): Awake and Alert Pre-procedure Verification/Time Out No Taken: Start Time: 13:05 Bleeding: None End Time: 13:05 Procedural Pain: 0 Post Procedural Pain: 0 Response to Treatment: Procedure was tolerated well Level of Consciousness (Post- Awake and Alert procedure): Post Debridement Measurements of Total Wound Length: (cm) 0.5 Width: (cm) 0.7 Depth: (cm) 0.2 Volume: (cm)  0.055 Character of Wound/Ulcer Post Debridement: Requires Further Debridement Severity of Tissue Post Debridement: Fat layer exposed Post Procedure Diagnosis Same as Pre-procedure Electronic Signature(s) Signed: 11/14/2020 5:39:18 PM By: [name] Ham MD Signed: 11/14/2020 5:51:53 PM By: [name] Hurst RN, BSN Entered By: [name] [name] on 11/14/2020 14:42:33 -------------------------------------------------------------------------------- Debridement Details Patient Name: Date of Service: [name], [name] 11/14/2020 11:15 A M Medical Record Number: [id number] Patient Account Number: [id number] Date of Birth/Sex: Treating RN: 12/23/1951 (69 y.o. [name] [name] Primary Care Provider: Tedra [name] Other Clinician: Referring Provider: Treating Provider/Extender: [name] [name] in Treatment: 451 Debridement Performed for Assessment: Wound #5 Right,Medial Lower Leg Performed By: Clinician [name] Pilling, RN Debridement Type: Chemical/Enzymatic/Mechanical Agent Used: Santyl Severity of Tissue Pre Debridement: Fat layer exposed Level of Consciousness (Pre-procedure): Awake and Alert Pre-procedure Verification/Time Out No Taken: Start Time: 13:05 Bleeding: None End Time: 13:05 Procedural Pain: 0 Post Procedural Pain: 0 Response to Treatment: Procedure was tolerated well Level of Consciousness (Post- Awake and Alert procedure): Post Debridement Measurements of Total Wound Length: (cm) 1.9 Width: (cm) 2.4 Depth: (cm) 0.4 Volume: (cm) 1.433 Character of Wound/Ulcer Post Debridement: Requires Further Debridement Severity of Tissue Post Debridement: Fat layer exposed Post Procedure Diagnosis Same as Pre-procedure Electronic Signature(s) Signed: 11/14/2020 5:39:18 PM By: [name] Ham MD Signed: 11/14/2020 5:51:53 PM By: [name] Hurst RN, BSN Entered By: [name] [name] on 11/14/2020  14:43:02 -------------------------------------------------------------------------------- HPI Details Patient Name: Date of Service: [name], [name] 11/14/2020 11:15 A M Medical Record Number: [id number] Patient Account Number: [id number] Date of Birth/Sex: Treating RN: 1951-04-05 (69 y.o. [name] [name] Primary Care Provider: Tedra [name] Other Clinician: Referring Provider: Treating Provider/Extender: [name] [name] in Treatment: [phone]  History of Present Illness HPI Description: this is a patient who initially came to [place] for wounds on the medial malleoli bilaterally as well as her upper medial lower extremities bilaterally. These wounds eventually healed with assistance of Apligraf's bilaterally. While this was occurring she developed the current wound which opened into a fairly substantial wound on the right lower extremity. These are mostly secondary to venous stasis physiology however the patient also has underlying scleroderma, pulmonary hypertension. The wound has been making good progress lately with the Hydrofera Blue-based dressing. 03/17/2015; patient had a arterial evaluation a year ago. Her right ABI was 0.86 left was 1.0. T brachial index was 0.41 on the right 0.45 on the left. Her oe bilateral common femoral artery waveforms were triphasic. Her white popliteal posterior tibial artery and anterior tibial artery waveforms were monophasic with good amplitude. Luteal artery waveforms were biphasic it was felt that her bilateral great toe pressures are of normal although adequate for tissue healing. 03/24/2015. The condition of this wound is not really improved that. He was covered with as fibrinous surface slough and eschar. This underwent an aggressive debridement with both a curette and scalpel. I still cannot really get down to what I can would consider to be a viable surface. There is no evidence of infection. Previous workup for ischemia roughly a year  ago was negative nevertheless I think that continues to be a concern 04/07/15. The patient arrived for application of second Apligraf. Once again the surface of this wound is certainly less viable than I would like for an advanced treatment option. An aggressive debridement was done. She developed some arteriolar bleeding which required that along pressure and silver alginate. 04/21/15: change in the condition of this wound. Once again it is covered in a gelatinous surface slough. After debridement today surface of the wound looks somewhat better but now a heeling surface 05/05/15 Apligraf #4 applied.Still a lot of slough on this wound. 05/19/15 Apligraf #5 applied. Still a lot of slough on this wound I did not aggressively remove this 06/02/15; continued copious amounts of surface slough. This debridement fairly easily. 06/08/2015 -- the last time she had a venous duplex study done was over 3 years ago and the surgery was prior to that. I have recommended that she sees Dr. early for a another opinion regarding a repeat venous duplex and possibly more endovenous ablation of vein stripping of micro-phlebotomies. 06/16/15; wound has a gelatinous surface eschar that the debridement fairly easily to a point. I don't disagree with the venous workup and perhaps even arterial re-evaluation. She is on prednisone 5 mg and continue his medications for her pulmonary artery hypertension I am not sure if the latter have any wound care healing issues I would need to investigate this. 06/23/15 continues with a gelatinous surface eschar with of fibrinous underlying. What I can see of this wound does not look unhealthy however I just can't get this material which I think is 2 different layers off. Empiric culture done 06/30/15; unfortunately not a lot of change in this wound. A gelatinous surface eschar is easily removed however it has a tight fibrinous surface underneath the. 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 The wound comes back and basically and unchanged state the. She has a gelatinous surface that is more easily removed however there is a tightly fibrinous surface underneath the. There is no evidence of infection. She has a vascular follow-up next month. I would have to inject  her in order to do a more aggressive debridement of this area 07/21/15 the wound is roughly in the same state albeit the debridement is done with greater ease. There is less of the fibrinous eschar underneath the. There is no evidence of infection. She has follow-up with vascular surgery next week. No evidence of surrounding infection. Her original distal wounds healed while this one formed. 07/28/15; wound is easier to debride. No wound erythema. She is seeing Dr. [name] next week. 08/18/15 Has been to [facility] for repeat ablation. Have been using medihoney pad with some improvement 08/25/15; absolutely no change in the condition of this wound in either its overall size or surface condition in many months now. At one point I had this down to [place] healthier surface I think with [facility] however this did not actually progress towards closure. Do not believe that the wound has actually deteriorated in terms of volume at all. We have been using a medihoney pad which allows easier debridement of the gelatinous eschar but again no overall actual improvement. the patient is going towards an ablation with pain and vascular which I think is scheduled for next week. The only other investigation that I could first see would be a biopsy. She does have underlying scleroderma 09/02/15 eschar is much easier to debridement however the base of this does not look particularly vibrant. We changed to Iodoflex. The patient had her ablation earlier this week 09/09/15 again the debridement over the base of this wound is easier and the base of the wound looks considerably better. We will continue the Iodoflex. Dr. Tawni Millers has expressed  his satisfaction with the result of her ablation 09/15/15 once again the wound is relatively free of surface eschar. No debridement was done today. It has a pale-looking base to it. although this is not as deep as it once was it seems to be expanding especially inferiorly. She has had recent venous ablation but this is no closer to healing.I've gone ahead and done a punch biopsy this from the inferior part of the wound close to normal skin 09/22/15: the wound is relatively free of surface eschar. There is some surrounding eschar. I'm not exactly sure at what level the surface is that I am seeing. 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 No evidence of atypical infection, malignancy. 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 No debridement was done 09/29/15; the wound surface is relatively free of surface eschar. There is eschar surrounding the walls of the wound. Aside from the improvement in the amount of surface slough. This wound has not progressed any towards closure. There is not even a surface that looks like there at this is ready. There is no evidence of any infection nor maligancy based on biopsy I did on 9/15. I continued with the Iodoflex however I am looking towards some alternative to try to promote some closure or filling in of this surface. Consider triple layer Oasis. Collagen did not result in adequate control of the surface slough 10/13/15; the patient was in hospital last week with severe anemia. The wound looks somewhat better after debridement although there is widening medially. There is no evidence of infection. 10/20/15; patient's wound on the right lateral lower leg is essentially unchanged. This underwent a light surface debridement and in general the debridement is easier and the surface looks improved. I noted in doing this on the side of the wound what appears to be a piece of calcium deposition.  The patient noted  that she had previous things on the tips of her fingers. 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 She follows with rheumatology and I have asked them to talk to her about this. In view of that S the nonhealing ulcer may have something to do with calcinosis and also unrelieved Raynaud's disease in this area. I should note that her original wounds on the right and left medial malleolus and the inner aspects of both legs just below the knees did however heal over 10/30/15; the patient's wound on the right lateral lower leg is essentially unchanged. I was able to remove some calcified material from the medial wound edge. 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 Otherwise the wound appears essentially unchanged there is less adherent surface eschar. Some of the calcified material was sent to pathology for analysis 11/17/15. The patient's wound on the right lateral leg is essentially unchanged. Wider Medially. 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 There is some surface slough but I did not debridement this. No evidence of surrounding infection. 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 This will make healing difficult 12/15/15; the wound has less depth surface has a fibrinous slough and calcifications in the wound edge no evidence of infection 12/22/15; the wound definitely has less Fibrinous slough on the base. Calcifications around the wound edges are still evident. Although the wound bed looks healthier it is still pale in appearance. Previous biopsy did not show malignancy 01/04/15;  surgical debridement of nonviable slough and subcutaneous tissue the wound cleans up quite nicely but appears to be expanding outward calcifications around the wound edges are still there. 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 She is to see her rheumatologist I'll see if he has any opinions on this. My punch biopsy done in September did not show calcifications although these are clearly evident. 01/19/16 light selective debridement of nonviable surface slough. There is epithelialization medially. This gives me reason for cautious optimism. She has been to see her rheumatologist, there is nothing that can be done for this type of soft tissue calcification associated with scleroderma 02/02/16 no debridement although there is a light surface slough. She has 2 peninsulas of skin 1 inferiorly and one medially. We continue to make a slight and slow but definite progressive here 02/16/16; light surface debridement with more attention to the circumference of the wound bed where the fibrinous eschar is more prevalent. No calcifications detected. She seems to have done nicely with [facility] with some epithelialization and some improvement in the overall wound volume. 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 Continued attention to the circumference of the wound where the fibrinous eschar in calcinosis or prevalent. No calcifications were detected. She has continued improvement with Hydrofera Blue. The wound is no longer as deep 03/15/16 surgical debridement done to remove surface escha Especially around the circumference of the wound where there is nonviable subcutaneous tissue. In spite of this there is considerable improvement in the overall dimensions and  depth of the wound. Islands of epithelialization are seen especially  medially inferiorly and superiorly to a lesser extent. She is using Hydrofera Blue at home 03/29/16; surgical debridement done to remove surface eschar and nonviable subcutaneous tissue. This cleans up quite nicely mention slightly larger in terms of length and width however depth is less 04/12/16; continued gradual improvement in terms of depth and the condition of the wound base. Debridement is done. Continuing long standing Hydrofera Blue at home with Kerlix Coban wraps 04/26/16; continued gradual improvement in terms of depth and management as well as condition of the wound base. Surgical debridement done she continues with Hydrofera Blue. 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 She initially came to this clinic venous insufficiency ulcers which have long since healed 05/17/16 continued improvement in terms of the depth and measurements of this wound although she has a tightly adherent fibrinous slough each time. We've been continued with long standing Hydrofera Blue which seems to done as well for this wound is many advanced treatment options. 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 She also has chronic venous insufficiency. She has an irritation on her lateral right ankle secondary to our wraps 05/10/16; wound appears to be smaller especially on the medial aspect and especially in the width. Wound was debridement surface looks better. She is also been in the hospital apparently with anemia again she tells me she had an endoscopy. Since she got home after 3 days which I believe was sometime last week she has had an irritated painful area on the right lateral ankle surrounding the lateral malleolus 05/31/16; much more adherent surface slough today then recently although I don't think the dimensions of changed that much. A more aggressive debridement is required. The irritated area over her medial malleolus is more  pruritic and painful and I don't think represents cellulitis 06/14/16; no major change in her wound dimensions however there is more tightly adherent surface slough which is disappointing. As well as she appears to have a new small area medially. Furthermore an irritated uncomfortable area on the lateral aspect of her right foot just below the lateral malleolus. 06/21/16; I'm seeing the patient back in follow-up for the new areas under her major wound on the right anterior leg. She has been using Hydrofera Blue to this area probably for several months now and although the dressings seem to be helping for quite a period of time I think things have stagnated lately. She comes in today with a relatively tight adherent surface slough and really no changes in the wound shape or dimension. The 2 small areas she had inferiorly are tiny but still open they seem improved this well. There is no uncontrolled edema and I don't think there is any evidence of cellulitis. 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 Patient has had vascular evaluations both venous and arterial. I have biopsied this area. There is no obvious infection. The worrisome thing today is that she seems to be developing areas of erythema and epithelial damage on the medial aspect of the right foot. Also to a lesser degree inferior to the actual wound itself. Again I see no obvious changes to suggest cellulitis however as this is the only treatable option I will probably give her antibiotics. 07/13/16 no major change in the lady's large anterior right leg  wound. Still covered with a very tightly adherent surface slough which is difficult to debridement. There is less erythema around this, culture last week grew pseudomonas I gave her ciprofloxacin. The area on her lateral right malleolus looks better- 08/02/16 the patient's wounds continued to  decline. Her original large anterior right leg wound looks deeper. Still adherent surface slough that is difficult to debridement. She has a small area just below this and a punched-out wound just below her lateral malleolus. In the meantime she is been in hospital with apparently an upper GI bleed on Plavix and aspirin. She is now just on Plavix she received 3 units of packed cells 08/23/16; since I last saw this 3 weeks ago, the open large area on her right leg looks about the same syrup. She has a small satellite lesion just underneath this. The area on her medial right ankle is now a deep necrotic wound. I attempted to debridement this however there is just too much pain. 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 She follows with vascular surgery and is scheduled for an angiogram in early September 09/06/16; the patient is going for an arteriogram tomorrow. Her original large wound on the right calf is about the same the satellite lesion underneath it is about the same however the area on her medial ankle is now deeper with exposed tendon. I am no longer attempting to debride these wounds 09/20/16; the patient has undergone a right femoral endarterectomy and Dacron patch angioplasty. This seems to have helped the flow in her right leg. 10/04/16; Arrived today for aggressive debridement of the wounds on her right calf the original wound the one beneath it and a difficult area over her right lateral leg just above the lateral malleolus. 10/25/16; her 3 open wounds are about the same in terms of dimensions however the surface appears a lot healthier post debridement. Using Iodoflex 10/18/16 we have been using Iodoflex to her wounds which she tolerated with some difficulty. 10/11/16; has been using Santyl for a period of time with some improvement although again very adherent surface slough would prevent any attempted healing this.  She has a original wound on the left calf, the satellite underneath that and the most recent wound on the right medial ankle. She has completed revascularization by Dr. early and has had venous ablation earlier. Want to go back to Iodoflex to see if week and get a healthier surface to this wound bed failing this I think she'll need to be taken to the OR and I am prepared to call Dr. Marla Roe to discuss this. She is obviously not a good candidate for general anesthesia however.; 11/08/16; I put her on Iodoflex last time to see if I can get the wound bed any healthier and unfortunately today still had tremendous surface slough. 11/15/16; 4 weeks' worth of Iodoflex with not much improvement. Debridement on the major wounds on her left anterior leg is easier however this does not maintain from week to week. The punched-out area on her medial right ankle 11/29/16; I attempted to change the patient last visit to [facility] however she states this burned and was very uncomfortable therefore we gave her permission to go back to the eye out of complex which she already had at home. Also she noted a lot of pain and swelling on the lateral aspect of her leg before she traveled to [facility] for the holidays,  I called her in doxycycline over the phone. This seems to have helped 12/06/16; Wounds unchanged by in large. Using Iodoflex 12/13/16; her wounds today actually looks somewhat better. The area on the right lateral lower leg has reasonably healthy-looking granulation and perhaps as actually filled and a bit. Debridement of the 2 wounds on the medial calf is easier and post debridement appears to have a healthier base. We have referred her to Dr. Migdalia Dk for consideration of operative debridement 12/20/16; we have a quick note from Dr. Merri Ray who feels that the patient needs to be referred to an academic center/plastic surgeon. This is due to the complexity of the patient's medical  issues as it applies to anything in the OR. We have been using Iodoflex 12/27/16; in general the wounds on the right leg are better in terms of the difficult to remove surface slough. She has been using Iodoflex. She is approved for Apligraf which I anticipated ordering in the next week or 2 when we get a better-looking surface 01/04/16 the deep wounds on the right leg generally look better. Both of them are debrided further surface slough. The area on the lateral right leg was not debrided. She is approved for Apligraf I think I'll probably order this either next week or the week after depending on the surface of the wounds superiorly. We have been using Iodoflex which will continue until then 01/11/16; the deep wounds on the right leg again have a surface slough that requires debridement. I've not been able to get the wound bed on either one of these wounds down to what would be acceptable for an advanced skin stab-like Apligraf. The area on the medial leg has been improving. We have been using hot Iodoflex to all wounds which seems to do the best at at least limiting the nonviable surface 01/24/17; we have continued Iodoflex and all her wound areas. Her debridement Gen. he is easier and the surface underneath this looks viable. Nevertheless these are large area wounds with exposed muscle at least on the anterior parts. We have ordered Apligraf's for 2 weeks from now. The patient will be away next week 02/07/17; the patient was close to have first Apligraf today however we did not order one. I therefore replaced her Iodoflex. She essentially has 3 large punched- out areas on her right anterior leg and right medial ankle. 02/11/17; Apligraf #1 02/25/17; Apligraf #2. In general some improvement in the right medial ankle and right anterior leg wounds. The larger wound medially perhaps some better 03/11/17; Apligraf #3. In general continued improvement in the right medial ankle and the right anterior leg  wounds. 03/25/17 Apligraf #4. In general continued improvement especially on the right medial ankle and the lower 04/08/17; Apligraf #5 in general continued improvement in all wound sites. 04/22/17; post Apligraf #5 her wound beds continued to look a lot better all of them up to the surface of the surrounding skin. Had a caramel-colored slough that I did not debrided in case there is residual Apligraf effect. The wounds are as good as I have seen these looking quite some time. 04/29/17; we applied [place] and last week after completing her Apligraf. Wounds look as though they've contracted somewhat although they have a nonviable surface which was problematic in the past. Apparently she has been approved for further Apligraf's. We applied Iodosorb today after debridement. 05/06/17; we're fortunate enough today to be able to apply additional Apligraf approved by her insurance. In general all of her wounds  look better Apligraf #6 05/24/17; Apligraf #7 continued improvement in all wounds 3 06/10/17 Apligraf #8. Continued improvement in the surface of all wounds. Not much of an improvement in dimensions as I might a follow 06/24/17 Apligraf #9 continued general improvement although not as much change in the wound areas I might of like. She has a new open area on the right anterior lateral ankle very small and superficial. She also has a necrotic wound on the tip of her right index finger probably secondary to severe uncontrolled Raynaud's phenomenon. She is already on sildenafil and already seen her rheumatologist who gave her Keflex. 07/08/17; Apligraf #10. Generalized improvement although she has a small additional wound just medial to the major wound area. 07/22/17; after discussion we decided not to reorder any further Apligraf's although there is been considerable improvement with these it hasn't been recently. The major wound anteriorly looks better. Smaller wounds beneath this and the more recent one and  laterally look about the same. The area on the right lateral lower leg looks about the same 07/29/17 this is a patient who is exceedingly complex. She has advanced scleroderma, crest syndrome including calcinosis, PAD status post revascularization, chronic venous insufficiency status post ablations. She initially presented to this clinic with wounds on her bilateral lower legs however these closed. More recently we have been dealing with a large open area superiorly on the right anterior leg, a smaller wound underneath this and then another one on the right medial lower extremity. These improve significantly with 10 Apligraf applications. Over the last 2-3 weeks we are making good progress with Hydrofera Blue and these seem to be making progress towards closure 08/19/17; wounds continued to make good improvement with Hydrofera Blue and episodic aggressive debridements 08/26/17; still using Hydrofera Blue. Good improvements 09/09/17; using Hydrofera Blue continued improvement. Area on the lateral part of her right leg has only a small remaining open area. The small inferior satellite region is for all intents and purposes closed. Her major wound also is come in in terms of depth and has advancing epithelialization. 09/16/17; using Hydrofera Blue with continued improvement. The smaller satellite wound. We've closed out today along with a new were satellite wound medially. The area on her medial ankle is still open and her major wound is still open but making improvement. All using Hydrofera Blue. Currently 09/30/17; using Hydrofera Blue. Still a small open area on the lateral right ankle area and her original major wound seems to be making gradual and steady improvement. 10/14/17; still using Hydrofera Blue. Still too small open areas on the right lateral ankle. Her original major wound is horizontal and linear. The most problematic area paradoxically seems to be the area to the medial area wears I thought  it would be the lateral. 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 10/28/17; still using Hydrofera Blue. Right lateral ankle has a very small open area superiorly on the most lateral part of the wound. Her original open wound has 2 open areas now separated by normal skin and we've redefined this. 11/11/17; still using Hydrofera Blue area and right lateral ankle continues to have a small open area on mostly the lateral part of the wound. Her original wound has 2 small open areas now separated by a considerable amount of normal skin 11/28/17; the patient called in slightly before Thanksgiving to report pain and erythema above the wound on the right leg. In the past this is responded  well to treatment for cellulitis and I gave her over the phone doxycycline. She stated this resulted in fairly abrupt improvement. We have been using Hydrofera Blue for a prolonged period of time to the larger wound anteriorly into the remaining wound on her right right lateral ankle. The latter is just about closed with only a small linear area and the bottom of the [place]. 12/02/17; use endoform and left the dressing on since last visit. There is no tenderness and no evidence of infection. 12/16/17; patient has been using Endoform but not making much progress. The 2 punched out open areas anteriorly which were the reminiscence of her major wound appear deeper. The area on the lateral aspect of her right calf also appears deeper. Also she has a puzzling tender swelling above her wound on the right leg. This seems larger than last time. Just above her wounds there appears to be some fluctuance in this area it is not erythematous and there is no crepitus 12/30/17; patient has been using Endoform up until last week we used Hydrofera Blue. Ultrasound of the swelling above her 2 major wounds last time was negative for a fluid collection. I gave her cefaclor for the erythema and  tenderness in this area which seems better. Unfortunately both punched out areas anteriorly and the area on her right medial lower leg appear deeper. In fact the lateral of the wounds anteriorly actually looks as though it has exposed tendon and/or muscle sheath. She is not systemically unwell. She is complaining of vaginitis type symptoms presumably Candida from her antibiotics. 01/06/18; we're using santyl. she has 2 punched out areas anteriorly which were initially part of a large wound. Unfortunately medially this is now open to tendon/muscle. All the wounds have the same adherent very difficult to debride surface. 01/20/18; 2 week follow-up using Santyl. She has the 2 punched out areas anteriorly which were initially part of her large surface wound there. Medially this still has exposed muscle. All of these have the same tightly adherent necrotic surface which requires debridement. PuraPly was not accepted by the patient's insurance however her insurance I think it changed therefore we are going to run Apligraf to gain 02/03/18; the patient has been using Santyl. The wound on the right lateral ankle looks improved and the 2 areas anteriorly on the right leg looks about the same. The medial one has exposed muscle. The lateral 1 requiresdebridement. We use PuraPly today for the first week 02/10/18; PuraPly #2. The patient has 3 wounds. The area on the right lateral ankle, 2 areas anteriorly that were part of her original large wound in this area the medial one has exposed muscle. All of the wounds were lightly debrided with a number 3 curet. PuraPly #2 applied the lateral wound on the calf and the right lateral ankle look better. 02/17/18; PuraPly #3. Patient has 3 wounds. The area on the right lateral ankle in 2 areas internally that were part of her original large wound. The lateral area has exposed muscle. She arrived with some complaints of pain around the right ankle. 02/24/18; PuraPly #4; not much  change in any of the 3 wound areas. Right lateral ankle, right lateral calf. Both of these required debridement with a #3 curet. She tolerates this marginally. The area on the medial leg still has exposed muscle. Not much change in dimensions 03/03/18 PuraPly #5. The area on the medial ankle actually looks better however the 2 separate areas that were original parts of the larger  right anterior leg wound look as though they're attempting to coalesce. 03/10/18; PuraPly #6. The area on the medial ankle actually continues to look and measures smaller however the 2 separate areas that were part of the original large wound on the right anterior leg have now coalesced. There hasn't been much improvement here. The lateral area actually has underlying exposed muscle 03/17/18-she is here in follow-up evaluation for ulcerations to the right lower extremity. She is voicing no complaints or concerns. She tolerated debridement. Puraply#7 placed 03/24/18; difficult right lower extremity ulcerations. PuraPly #8 place. She is been approved for [employer]. She did very well with Apligraf today however she is apparently reached her "lifetime max" 03/31/18; marginal improvement with PuraPly although her wounds looked as good as they have in several weeks today. Used TheraSkins #1 04/14/18 TheraSkin #2 today 04/28/18 TheraSkins #3. Wound slightly improved 05/12/18; TheraSkin skin #4. Wound response has been variable. 05/27/18 TheraSkin #5. Generally improvement in all wound areas. I've also put her in 3 layer compression to help with the severe venous hypertension 06/09/18; patient is done quite well with the TheraSkins unfortunately we have no further applications. I also put her in 3 layer compression last week and that really seems to of helped. 06/16/18; we have been using silver collagen. Wounds are smaller. Still be open area to the muscle layer of her calf however even that is contracted somewhat. She tells me that at night  sometimes she has pain on the right lateral calf at the site of her lower wound. Notable that I put her into 3 layer compression about 3 weeks ago. She states that she dangles her leg over the bed that makes it feel better but she does not describe claudication during the day She is going to call her secondary insurance to see if they will continue to cover advanced treatment products I have reviewed her arterial studies from 01/22/17; this showed an ABI in the right of 1 and on the left noncompressible. TBI on the right at 0.30 on the left at 0.34. It is therefore possible she has significant PAD with medial calcification falsely elevating her ABI into the normal range. I'll need to be careful about asking her about this next week it's possible the 3 layer compression is too much 06/23/18; was able to reapply TheraSkin 1 today. Edema control is good and she is not complaining of pain no claudication 07/07/18;no major change. New wound which was apparently a taper removal injury today in our clinic between her 2 wounds on the right calf TheraSkins #2 07/14/18; I think there is some improvement in the right lateral ankle and the medial part of her wound. There is still exposed muscle medially. 07/28/18; two-week follow-up. TheraSkins#3. Unfortunately no major change. 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 She seems to be doing well and in the base of the wound appears to show some progress at this point. This is the last approved [employer] of the second set. 08/25/18; she has completed TheraSkin. There has been some improvement on the right lateral calf wound as well as the anterior leg wounds. The open area to muscle medially on the anterior leg wound is smaller. I'm going to transition her back to [facility]  under Kerlix Coban change every second day. She reports that she had some  calcification removed from her right upper arm. We have had previous problems with calcifications in her wounds on her legs but that has not happened recently 09/08/18;using Hydrofera Blue on both her wound areas. Wounds seem to of contracted nicely. She uses Kerlix Coban wrap and changes at home herself 09/22/2018; using Hydrofera Blue on both her wound areas. Dimensions seem to have come down somewhat. There is certainly less depth in the medial part of the mid tibia wound and I do not think there is any exposed muscle at this point. 10/06/2018; 2-week follow-up. Using [facility] on her wound areas. Dimensions have come down nicely both on the right lateral ankle area in the right mid tibial area. She has no new complaints 10/20/2018; 2-week follow-up. She is using Hydrofera Blue. Not much change from the last time she was here. The area on the lateral ankle has less depth although it has raised edges on one side. I attempted to remove as much of the raised edge as I could without creating more additional wounds. The area on the right anterior mid tibia area looks the same. 11/03/2018; 2-week follow-up she is using Hydrofera Blue. On the right anterior leg she now has 2 wounds separated by a large area of normal skin. The area on the medial part still has I think exposed muscle although this area itself is a lot smaller. The area laterally has some depth. Both areas with necrotic debris. The area on her right lateral ankle has come in nicely 11/17/2018; patient continues to use Hydrofera Blue. We have been increasing separation of the 2 wounds anteriorly which were at one point joint the area on the right lateral calf continues to have I think some improvement in depth. 12/08/2018; patient continues to use Hydrofera Blue. There is some improvement in the area on the right lateral calf. The 2 areas that were initially part of  the original large wound in the mid right tibia are probably about the same. In fact the medial area is probably somewhat larger. We will run puraply through the patient's insurance 12/22/2018; she has been using Hydrofera Blue. We have small wounds on the right lateral calf and 2 small areas that were initially part of a large wound in the right mid tibia. We applied pure apply #1 today. 12/29/2018; we applied puraply #2. Her wounds look somewhat better especially on the right lateral calf and the lateral part of her original wound in the mid tibial area. 01/05/2019; perhaps slightly improved in terms of wound bed condition but certainly not as much improvement as I might of liked. Puraply #3 1/13: we did not have a correct sized puraply to apply. wounds more pinched out looking, I increased her compression to 3 layer last week to help with significant multilevel venous hypertension. Since then I've reviewed her arterial status. She has a right femoral endarterectomy and a distal left SFA stent. She was being followed by Dr. [name] for a period however she does not appear to have seen him in 3 years. I will set up an appointment. 1/20. The patient has an additional wound on the right lateral calf between the distal wound and proximal wounds. We did not have Puraply last week. Still does not have a follow-up with Dr. early 1/27: Follow-up with Dr. early has been arranged apparently with follow-up noninvasive studies. Wounds are measuring roughly the same although they certainly look smaller 2/3; the patient had non invasive studies. Her ABIs on the right were 0.83  and on the left 1.02 however there was no great toe pressure bilaterally. Also worrisome monophasic waveforms at the PTA and dorsalis pedis. We are still using Puraply. We have had some improvement in all of the wounds especially the lateral part of the mid tibial area. 2/10; sees Dr. early of vein and vascular re-arterial studies next week.  Puraply reapplied today. 2/17; Dr. early of vein and vascular his appointment is tomorrow. Puraply reapplied after debridement of all wounds 2/24; the patient saw Dr. early I reviewed his note. sHe noted the previous right femoral endarterectomy with a Dacron patch. He also noted the normal ABI and the monophasic waveforms suggesting tibial disease. Overall he did not feel that she had any evidence of arterial insufficiency that would impair her wound healing. He did note her venous disease as well. He suggested PRN follow-up. 3/2; I had the last puraply applied today. The original wounds over the mid tibia area are improved where is the area on the right lower leg is not 3/9; wounds are smaller especially in the right mid tibia perhaps slightly in the right lateral calf. We finished with puraply and went to endoform today 3/23; the patient arrives after 2 weeks. She has been using endoform. I think all of her wounds look slightly better which includes the area on the right lateral calf just above the right lateral malleolus and the 2 in the right mid tibia which were initially part of the same wound. 4/27; TELEHEALTH visit; the patient was seen for telehealth visit today with her consent in the middle of the worldwide epidemic. Since she was last here she called in for antibiotics with pain and tenderness around the area on the right medial ankle. I gave her empiric doxycycline. She states this feels better. She is using endoform on both of these areas 5/11 TELEHEALTH; the patient was seen for telehealth visit today. She was accompanied at home by her husband. She has severe pulmonary hypertension accompanied scleroderma and in the face of the Covid epidemic cannot be safely brought into our clinic. We have been using endoform on her wound areas. There are essentially 3 wound areas now in the left mid tibia now 2 open areas that it one point were connected and one on the right lateral ankle just above  the malleolus. 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 She cannot come to clinic. The wounds were reviewed today via telehealth. She has severe chronic venous hypertension which I think is centrally mediated. She has wounds on her right anterior tibia and right lateral ankle area. These are chronic. She has been using endoform. 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 We have been following her from telehealth. She has severe chronic venous hypertension which may be mostly centrally mediated secondary to right heart heart failure. She has wounds on her right anterior tibia and right lateral ankle these are chronic we have been using endoform 6/22; TELEHEALTH; this patient was seen today via telehealth. 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 Since the last time we had contact with this patient she developed some pain and erythema around  the wound on her right lateral malleolus/ankle and we put in antibiotics for her. This is resulted in good improvement with resolution of the erythema and tenderness. I changed her to silver alginate last time. We had been using endoform for an extended period of time 7/13; TELEHEALTH; this patient was seen today via telehealth. 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 She would continue to be at a prohibitive risk to be brought into our clinic unless this was absolutely necessary. These visits have been done with her approval as well as her husband. We have been using silver alginate to the areas on the right mid tibia and  right lateral lower leg. 7/27 TELEHEALTH; patient was seen along with her husband today via telehealth. 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 We changed her to sample last visit. She has 2 areas a chronic wound on her right mid tibia and one just above her ankle. These were not the original wounds when she came into this clinic but she developed them during treatment 8/17; she comes in for her first face-to-face visit in a long period. She has a remaining area just medial to the right tibia which is the last open part of her large wound across this area. She also has an area on the right lateral lower leg. We prescribed Santyl last telehealth visit but they were concerned that this was making a deeper so they put silver alginate on it last week. Her husband changes the dressings. 8/31; using Santyl to the 2 wound areas some improvement in wound surfaces. Husband has surgery in 2 weeks we will put her out 3 weeks. Any of the advanced treatment options that I can think of that would be eligible for this wound would also cause her to have to come in weekly. The risk that the patient is just too high 9/21. Using Santyl to the 2 wound areas. Both of these are somewhat better although the medial mid tibia area still has exposed muscle. Lateral ankle requiring debridement. Using Santyl 10/12; using Santyl to the 2 wound areas. One on the right lateral ankle and the other in the medial calf which still has exposed muscle. Both areas have come down in size and have better looking surfaces. She has made nice progress with santyl 11/2; we have been using Santyl to the 2 wound areas. Right lateral ankle and the other in the mid tibia area the medial part of this still has exposed muscle. 11/23/2019 on evaluation today patient appears to be doing about the same really with regard to her wounds. She is actually not very  pleased with how things seem to be progressing at this point she tells me that she really has not noted much improvement unfortunately. With that being said there is no signs of active infection at this time. There is some slough buildup noted at this time which again along with some dry skin around the edges of the wound I think would benefit her to try to debride some of this away. Fortunately her pain is doing fairly well. She still has exposed muscle in the right medial/tibial area. 12/14; TELEHEALTH; she was changed to [facility] to the right calf wound and right lateral ankle wound when she was here last time. Unfortunately since then she had a fall with a pelvic fracture and a fracture of her wrist. She was apparently  hospitalized for 5 days. I have not looked at her discharge summary. She apparently came out of the hospital with a blister on her right heel. She was seen today via telehealth by myself and our case manager. The patient and her husband were present. She has been using Hydrofera Blue at our direction from the last time she was in the clinic. There is been no major improvement in fact the areas appear deeper and with a less viable surface 01/04/2020; TELEHEALTH; the patient was seen today in accompaniment of her husband and our nurse. She has 3 open areas 1 on the right medial mid tibia, one on the right lateral ankle and a large eschared pressure area on the right heel. We have been using Iodoflex to the 2 original wounds. 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 It is simply too perilous for her to be seen in any other way 1/26; TELEHEALTH; the patient was seen today in accompaniment of her husband and our RN one of our nurses. She still has the 3 open areas 1 on the right medial mid tibia which is the remanent of a more extensive wound in this area, one on the right lateral ankle and a large eschared pressure area on the right heel. We have  been using Iodoflex to the 2 original wounds and a bed at 9 application to the eschared area on the heel 2/15; the first time we have seen this patient and then several months out of concern for the pandemic. She had a large horizontal wound in the mid tibia. Only the medial aspect of this is still open. Area on the lateral ankle is just about closed. She had a new pressure ulcer on the right heel which I have removed some of the eschar. We have been using Iodoflex which I will continue. The area in the mid tibia has a round circle in the middle of exposed muscle. I think we would have to use an advanced treatment product to stimulate granulation over this area. We will run this through her insurance. She is not eligible for plastic surgery for many reasons 3/1; TELEHEALTH. The patient was seen today by telehealth. 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 We have been using Iodoflex to the wound areas which include the right anterior mid tibia, right lateral ankle and the right heel. All of these were reviewed 3/8; the patient's wound just above the right ankle is closed. She still has a contracting black eschar on the heel where she had a pressure ulcer. The medial part of her original wound on the mid tibia has exposed muscle. We have made really made no progress in this area although we have managed to get a lot of the original wound in this area to close. We used Apligraf #1 today 3/22; the patient's ankle wound remains closed. She still has a contracting black eschar on the heel although it seems to have less surface area. It still not clear whether there is any depth here. We used Apligraf #2 today in an attempt to get granulation over the exposed muscle and what is left of her mid tibia area. 4/5; everything is closed except the medial aspect of the mid tibia wound, as well as the pressure injury on  the right heel. She has been using Betadine to the right heel which has been gradually contracting. We used Apligraf #3 today.  4/19; Apligraf #4. Still a pressure area on the right heel she has been using Betadine superficial excoriated areas she has been applying salicylic acid to on the anterior leg and the thick horn on the leg just above her wound area 5/3; Apligraf #5. Unfortunately she came in today with a reopening of the area on the right lateral lower leg. Not much change in the wound we have been treating in the mid calf. Quite a bit of edema surrounding this wound. She reports that she was up on her feet quite a bit. The area on the right heel is separating she has been using Betadine She comes in with a new wound on the left lateral malleolus which is very disappointing this is been open since last week 5/17; we applied her last Apligraf last time. Unfortunately that did not have any effect on the deep area medially in the mid tibial area. However the area over the right lateral malleolus is a lot better. Right heel also is contracted we used Iodoflex last time. The new wound on the left lateral malleolus were using Santyl to. This required debridement. 6/1. Not much change in the area on the right medial mid tibia. Right lateral ankle is improved she has the necrotic area on the right heel which was a pressure area. New area on the left lateral malleolus from last time. I changed her to Iodoflex to the area on the left lateral malleolus she said this hurt I have put her back on Santyl 6/15; right mid tibia is unchanged. I do not think there is anything I can do to this topically that we will get this to granulate over the muscle. And I am furthermore I am not sure that she is a surgical candidate either because of her severe pulmonary issues or because she really does not want to go through it. The area on the right lateral malleolus is a small punched out area. The area on the left  lateral malleolus again small punched out with nonviable surface Pressure ulcer on the right heel at separating black eschar. I went ahead and remove this today she has an area on the left anterior mid tibia just laterally. Geographic wounds debris on the surface. Some erythema here. 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 There is skin changes widely Change the primary dressing to Sorbact to all wound areas. She needs compression on the left leg as well as the right 6/21; right mid tibia which was her one remaining wound at 1 point is unchanged The area on the right lateral malleolus actually is close to closing over still a small punched out area The area on the left lateral malleolus again a deep wound it is this 1 that she feels pain in. Pressure ulcer on the right heel was cultured today. New area on the left anterior mid tibia several geographic wounds last week in the setting of chronic venous insufficiency this actually looks some better 7/6; Right mid tibia about the same. Exposed muscle Left lateral malleolus again necrotic debris over the surface. Pressure ulcer on the right heel. She finished the Keflex we prescribed. Necrotic debris debrided with a #3 curette The rest of her wounds on the right mid tibia right lateral ankle are better Her husband reminds me that she had a right femoral endarterectomy and has 2 stents in her left thigh placed in 2011 approximately by vascular surgery in [place]. She saw Dr. [name] last in February 2020. At  that point he did not think that the arterial insufficiency she had on the right was sufficient to explain any of her right lower extremity wounds however she now has an area on the left lateral malleolus that looks like an ischemic wound 7/12; we have been using Sorbact all wound areas Right mid tibia about the same exposed muscle Left lateral malleolus small wound with debris in the surface punched out Pressure ulcer of  the right heel requiring debridement of necrotic debris Lateral ankle wound on the right is just about closed Right mid tibia wound about the same still with exposed muscle. 7/26 really no change in any wounds. We have been using Sorbact. She has bilateral lower extremity wounds. This is in the setting of scleroderma, severe venous hypertension. She also has known PAD and I had really hoped to be able to get a review by Dr. [name] but apparently that is not booked until August 31. I have asked her to call back to vein and vascular and get an appointment with somebody a little earlier if that is possible. 8/16; patient's area on the right ankle which was a chronic wound is closed over again. The area on the right mid lower leg, right heel left lateral ankle are all about the same. Pressure ulcer on the right heel is still not viable. New areas identified on the right lateral calf. She has a follow-up with Dr. [name] on 8/31. Phenomenally I would like him to go over her arterial status with regards to her underlying stents. 9/13; patient had her ARTERIAL studies done however apparently Dr. [name] is now in [place] so she did not get an appointment with him on 8/31 and was not referred to any other provider. On the right side her PTA was monophasic. Noncompressible. TBI on the right at 0.41 on the left she was monophasic and noncompressible they did not do a TBI because she had a Band-Aid on her big toe. Patient does not describe claudication although she is having a lot of pain in the left lateral malleolus. 10/11; the patient had a right-sided interventions on 10/5. She had a angioplasty of the right anterior tibial artery as well as the tibioperoneal trunk/peroneal artery she had a drug-coated balloon angioplasty of the right superficial femoral artery. On the left she did not have any interventions yet but is going to have another look at this in 2 weeks. The superficial femoral artery on the left  and the associated stent are patent popliteal artery is patent to the dominant vessel runoff is the anterior tibial which has several high-grade stenosis in the proximal one third. We have been using Sorbact. She also has a new area on the right upper mid tibia. 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 She tolerated this well. She is using Iodoflex and her husband is doing a kerlix Coban on her. She arrives in clinic today with the original medial part of her mid tibia wound with muscle exposure small area on the right ankle which was part of her original wound she has 2 punched-out areas laterally and 1 above her area anteriorly. Nonviable surfaces similarly the right heel is nonviable. On the left side she has an area on the left foot and the left lateral  ankle similar nonviable surfaces. 11/8; patient arrives in clinic today with nothing improved. She has a multiple lightly group of wounds on the right lower leg presumably different etiologies including a skin biopsy, the original wound she had medially and some punched-out areas that are not of clear defined etiology. She says she was in a lot of pain this week could not have her leg up on the bed did not get completely relief from dropping it over the bed. She also has a small area on the left lateral malleolus and the left lateral foot necrotic surfaces on all of these wounds. We have been using Iodoflex I attempted to put her in 3 layer compression last week that did not go well. We will be backing off on kerlix Coban this week. 11/15; the ultrasound that I sent the patient for last week showed findings consistent with acute deep vein thrombosis involving the right distal femoral vein and right  popliteal vein. I was concerned about the combination of Plavix and Xarelto however apparently Dr. [name] felt that was not a problem. She is now going through the acute dosing of Xarelto. She has an appointment with Dr. Trula Slade on 12/5. Electronic Signature(s) Signed: 11/14/2020 5:39:18 PM By: [name] Ham MD Entered By: [name] [name] on 11/14/2020 12:58:46 -------------------------------------------------------------------------------- Physical Exam Details Patient Name: Date of Service: [name], [name] 11/14/2020 11:15 A M Medical Record Number: [id number] Patient Account Number: [id number] Date of Birth/Sex: Treating RN: 16-Nov-1951 (69 y.o. [name] [name] Primary Care Provider: Tedra [name] Other Clinician: Referring Provider: Treating Provider/Extender: [name] [name] in Treatment: 67 Constitutional Patient is hypertensive.. Pulse regular and within target range for patient.Marland Kitchen Respirations regular, non-labored and within target range.. Temperature is normal and within the target range for the patient.Marland Kitchen Appears in no distress. Cardiovascular Right foot is cool and I had difficulty feeling her pedal pulses. Swelling in the right leg has improved but there is still a firm area in the posterior calf mid aspect which is tender. Notes Wound exam The midportion of the right tibia part of her original wound looks about the same. She has the 2 right lateral wounds that are punched-out areas with necrotic debris 1 of these was a biopsy site the other of unclear etiology. The right lateral ankle part of again her original wound complex is small but with a nonviable surface I did not debride this. She has the same areas on the left lateral foot and left lateral ankle Electronic Signature(s) Signed: 11/14/2020 5:39:18 PM By: [name] Ham MD Entered By: [name] [name] on 11/14/2020  13:01:06 -------------------------------------------------------------------------------- Physician Orders Details Patient Name: Date of Service: [name], [name] 11/14/2020 11:15 A M Medical Record Number: [id number] Patient Account Number: [id number] Date of Birth/Sex: Treating RN: 1951-06-09 (69 y.o. 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 Dressing Change Frequency Change Dressing every other day. - all wounds Skin Barriers/Peri-Wound Care Moisturizing lotion - both legs Wound Cleansing May shower with protection. Primary Wound Dressing Wound #16 Right Calcaneus Santyl Ointment Wound #19 Right,Lateral Malleolus Santyl Ointment Wound #20 Left,Lateral Malleolus Santyl Ointment Wound #23 Right,Posterior Lower Leg Santyl Ointment Wound #24 Right,Anterior Lower Leg Santyl Ointment Wound #25 Left,Lateral Foot Santyl Ointment Wound #26 Right,Lateral Lower Leg Santyl Ointment Wound #27 Right,Distal,Anterior Lower Leg Santyl  Ointment Wound #5 Right,Medial Lower Leg Santyl Ointment Secondary Dressing Dry Gauze - all wounds Edema Control Kerlix and Coban - Bilateral Avoid standing for long periods of time Elevate legs to the level of the heart or above for 30 minutes daily and/or when sitting, a frequency of: - throughout the day Off-Loading Turn and reposition every 2 hours Other: - float heels off of bed/chair with pillow under calves Patient Medications llergies: Sulfa (Sulfonamide Antibiotics), Levaquin, penicillin A Notifications Medication Indication Start End 11/14/2020 Santyl DOSE topical 250 unit/gram ointment - ointment topical to wound change daily Electronic Signature(s) Signed: 11/14/2020 1:03:20 PM By: [name] Ham MD Entered By: [name] [name] on 11/14/2020 13:03:12 -------------------------------------------------------------------------------- Problem List Details Patient Name: Date of Service: [name] 11/14/2020 11:15 A M Medical Record Number: [id number] Patient Account Number: [id number] Date of Birth/Sex: Treating RN: 08/10/1951 (69 y.o. 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 02/24/2015 No Yes I87.331 Chronic venous hypertension (idiopathic) with ulcer and inflammation of right 10/04/2016 No Yes lower extremity L89.616 Pressure-induced deep tissue damage of right heel 12/14/2019 No Yes L97.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  05/31/2020 No Yes I70.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 10/31/2020 No Yes lower leg I70.248 Atherosclerosis of native arteries of left leg with ulceration of other part of 10/31/2020 No Yes lower leg Inactive Problems ICD-10 Code Description Active Date Inactive Date 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 11/14/2020 11:15 A M Medical Record Number: [id number] Patient Account Number: [id number] Date of Birth/Sex: Treating RN: March 08, 1951 (69 y.o. [name] [name] Primary Care Provider: Tedra [name] Other Clinician: Referring Provider: Treating Provider/Extender: [name] [name] in Treatment: 451 Subjective History of Present Illness (HPI) this is a patient who initially came to [place] for wounds on the medial malleoli bilaterally as well as her upper medial lower extremities bilaterally. These wounds eventually healed with assistance of Apligraf's bilaterally. While this was occurring she developed the  current wound which opened into a fairly substantial wound on the right lower extremity. These are mostly secondary to venous stasis physiology however the patient also has underlying scleroderma, pulmonary hypertension. The wound has been making good progress lately with the Hydrofera Blue-based dressing. 03/17/2015; patient had a arterial evaluation  a year ago. Her right ABI was 0.86 left was 1.0. T brachial index was 0.41 on the right 0.45 on the left. Her oe bilateral common femoral artery waveforms were triphasic. Her white popliteal posterior tibial artery and anterior tibial artery waveforms were monophasic with good amplitude. Luteal artery waveforms were biphasic it was felt that her bilateral great toe pressures are of normal although adequate for tissue healing. 03/24/2015. The condition of this wound is not really improved that. He was covered with as fibrinous surface slough and eschar. This underwent an aggressive debridement with both a curette and scalpel. I still cannot really get down to what I can would consider to be a viable surface. There is no evidence of infection. Previous workup for ischemia roughly a year ago was negative nevertheless I think that continues to be a concern 04/07/15. The patient arrived for application of second Apligraf. Once again the surface of this wound is certainly less viable than I would like for an advanced treatment option. An aggressive debridement was done. She developed some arteriolar bleeding which required that along pressure and silver alginate. 04/21/15: change in the condition of this wound. Once again it is covered in a gelatinous surface slough. After debridement today surface of the wound looks somewhat better but now a heeling surface 05/05/15 Apligraf #4 applied.Still a lot of slough on this wound. 05/19/15 Apligraf #5 applied. Still a lot of slough on this wound I did not aggressively remove this 06/02/15; continued copious amounts of surface slough. This debridement fairly easily. 06/08/2015 -- the last time she had a venous duplex study done was over 3 years ago and the surgery was prior to that. I have recommended that she sees Dr. early for a another opinion regarding a repeat venous duplex and possibly more endovenous ablation of vein stripping of micro-phlebotomies. 06/16/15; wound  has a gelatinous surface eschar that the debridement fairly easily to a point. I don't disagree with the venous workup and perhaps even arterial re-evaluation. She is on prednisone 5 mg and continue his medications for her pulmonary artery hypertension I am not sure if the latter have any wound care healing issues I would need to investigate this. 06/23/15 continues with a gelatinous surface eschar with of fibrinous underlying. What I can see of this wound does not look unhealthy however I just can't get this material which I think is 2 different layers off. Empiric culture done 06/30/15; unfortunately not a lot of change in this wound. A gelatinous surface eschar is easily removed however it has a tight fibrinous surface underneath the. 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 The wound comes back and basically and unchanged state the. She has a gelatinous surface that is more easily removed however there is a tightly fibrinous surface underneath the. There is no evidence of infection. She has a vascular follow-up next month. I would have to inject her in order to do a more aggressive debridement of this area 07/21/15 the wound is roughly in the same state albeit the debridement is done with greater ease. There is less of the fibrinous eschar underneath the. There is no evidence of infection. She has follow-up  with vascular surgery next week. No evidence of surrounding infection. Her original distal wounds healed while this one formed. 07/28/15; wound is easier to debride. No wound erythema. She is seeing Dr. [name] next week. 08/18/15 Has been to [place] for repeat ablation. Have been using medihoney pad with some improvement 08/25/15; absolutely no change in the condition of this wound in either its overall size or surface condition in many months now. At one point I had this down to [place] healthier surface I think with [facility] however this did not actually progress towards  closure. Do not believe that the wound has actually deteriorated in terms of volume at all. We have been using a medihoney pad which allows easier debridement of the gelatinous eschar but again no overall actual improvement. the patient is going towards an ablation with pain and vascular which I think is scheduled for next week. The only other investigation that I could first see would be a biopsy. She does have underlying scleroderma 09/02/15 eschar is much easier to debridement however the base of this does not look particularly vibrant. We changed to Iodoflex. The patient had her ablation earlier this week 09/09/15 again the debridement over the base of this wound is easier and the base of the wound looks considerably better. We will continue the Iodoflex. Dr. Tawni Millers has expressed his satisfaction with the result of her ablation 09/15/15 once again the wound is relatively free of surface eschar. No debridement was done today. It has a pale-looking base to it. although this is not as deep as it once was it seems to be expanding especially inferiorly. She has had recent venous ablation but this is no closer to healing.I've gone ahead and done a punch biopsy this from the inferior part of the wound close to normal skin 09/22/15: the wound is relatively free of surface eschar. There is some surrounding eschar. I'm not exactly sure at what level the surface is that I am seeing. 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 No evidence of atypical infection, malignancy. 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 No debridement was done 09/29/15; the wound surface is relatively free of surface eschar. There is eschar surrounding the walls of the wound. Aside from the improvement in the amount of surface slough. This wound has not progressed any towards closure. There is not even a surface that looks like there at this is ready. There is no evidence of any  infection nor maligancy based on biopsy I did on 9/15. I continued with the Iodoflex however I am looking towards some alternative to try to promote some closure or filling in of this surface. Consider triple layer Oasis. Collagen did not result in adequate control of the surface slough 10/13/15; the patient was in hospital last week with severe anemia. The wound looks somewhat better after debridement although there is widening medially. There is no evidence of infection. 10/20/15; patient's wound on the right lateral lower leg is essentially unchanged. This underwent a light surface debridement and in general the debridement is easier and the surface looks improved. I noted in doing this on the side of the wound what appears to be a piece of calcium deposition. The patient noted that she had previous things on the tips of her fingers. 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 She follows with rheumatology and I have asked them to talk to her about  this. In view of that S the nonhealing ulcer may have something to do with calcinosis and also unrelieved Raynaud's disease in this area. I should note that her original wounds on the right and left medial malleolus and the inner aspects of both legs just below the knees did however heal over 10/30/15; the patient's wound on the right lateral lower leg is essentially unchanged. I was able to remove some calcified material from the medial wound edge. 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 Otherwise the wound appears essentially unchanged there is less adherent surface eschar. Some of the calcified material was sent to pathology for analysis 11/17/15. The patient's wound on the right lateral leg is essentially unchanged. Wider Medially. 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 There is some surface slough but I did not debridement this. No evidence of surrounding infection. 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 This will make healing difficult 12/15/15; the wound has less depth surface has a fibrinous slough and calcifications in the wound edge no evidence of infection 12/22/15; the wound definitely has less Fibrinous slough on the base. Calcifications around the wound edges are still evident. Although the wound bed looks healthier it is still pale in appearance. Previous biopsy did not show malignancy 01/04/15; surgical debridement of nonviable slough and subcutaneous tissue the wound cleans up quite nicely but appears to be expanding outward calcifications around the wound edges are still there. 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 She is to see her rheumatologist I'll see if he has any opinions on this. My punch biopsy done in September did not show calcifications although these are clearly evident. 01/19/16 light selective debridement of nonviable surface slough. There is epithelialization medially. This gives me reason for cautious optimism. She has been to see her rheumatologist, there is nothing that can be done for this type of soft tissue calcification associated with scleroderma 02/02/16 no debridement although there is a light surface slough. She has 2 peninsulas of skin 1 inferiorly and one medially. We continue to make a slight and slow but definite progressive here 02/16/16; light surface debridement with more attention to the circumference of the wound bed where the fibrinous eschar is more prevalent. No calcifications detected. She seems to have done nicely with the [facility] with  some epithelialization and some improvement in the overall wound volume. 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 Continued attention to the circumference of the wound where the fibrinous eschar in calcinosis or prevalent. No calcifications were detected. She has continued improvement with Hydrofera Blue. The wound is no longer as deep 03/15/16 surgical debridement done to remove surface escha Especially around the circumference of the wound where there is nonviable subcutaneous tissue. In spite of this there is considerable improvement in the overall dimensions and depth of the wound. Islands of epithelialization are seen especially medially inferiorly and superiorly to a lesser extent. She is using Hydrofera Blue at home 03/29/16; surgical debridement done to remove surface eschar and nonviable subcutaneous tissue. This cleans up quite nicely mention slightly larger in  terms of length and width however depth is less 04/12/16; continued gradual improvement in terms of depth and the condition of the wound base. Debridement is done. Continuing long standing Hydrofera Blue at home with Kerlix Coban wraps 04/26/16; continued gradual improvement in terms of depth and management as well as condition of the wound base. Surgical debridement done she continues with Hydrofera Blue. 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 She initially came to this clinic venous insufficiency ulcers which have long since healed 05/17/16 continued improvement in terms of the depth and measurements of this wound although she has a tightly adherent fibrinous slough each time. We've been continued with long standing Hydrofera Blue which seems to done as well for this wound is many advanced treatment options. Etiology is felt to be calcinosis related to her  underlying scleroderma. She also has chronic venous insufficiency. She has an irritation on her lateral right ankle secondary to our wraps 05/10/16; wound appears to be smaller especially on the medial aspect and especially in the width. Wound was debridement surface looks better. She is also been in the hospital apparently with anemia again she tells me she had an endoscopy. Since she got home after 3 days which I believe was sometime last week she has had an irritated painful area on the right lateral ankle surrounding the lateral malleolus 05/31/16; much more adherent surface slough today then recently although I don't think the dimensions of changed that much. A more aggressive debridement is required. The irritated area over her medial malleolus is more pruritic and painful and I don't think represents cellulitis 06/14/16; no major change in her wound dimensions however there is more tightly adherent surface slough which is disappointing. As well as she appears to have a new small area medially. Furthermore an irritated uncomfortable area on the lateral aspect of her right foot just below the lateral malleolus. 06/21/16; I'm seeing the patient back in follow-up for the new areas under her major wound on the right anterior leg. She has been using Hydrofera Blue to this area probably for several months now and although the dressings seem to be helping for quite a period of time I think things have stagnated lately. She comes in today with a relatively tight adherent surface slough and really no changes in the wound shape or dimension. The 2 small areas she had inferiorly are tiny but still open they seem improved this well. There is no uncontrolled edema and I don't think there is any evidence of cellulitis. 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 Patient has had vascular evaluations both  venous and arterial. I have biopsied this area. There is no obvious infection. The worrisome thing today is that she seems to be developing areas of erythema and epithelial damage on the medial aspect of the right foot. Also to a lesser degree inferior to the actual wound itself. Again I see no obvious changes to suggest cellulitis however as this is the only treatable option I will probably give her antibiotics. 07/13/16 no major change in the lady's large anterior right leg wound. Still covered with a very tightly adherent surface slough which is difficult to debridement. There is less erythema around this, culture last week grew pseudomonas I gave her ciprofloxacin. The area on her lateral right malleolus looks better- 08/02/16 the patient's wounds continued to decline.  Her original large anterior right leg wound looks deeper. Still adherent surface slough that is difficult to debridement. She has a small area just below this and a punched-out wound just below her lateral malleolus. In the meantime she is been in hospital with apparently an upper GI bleed on Plavix and aspirin. She is now just on Plavix she received 3 units of packed cells 08/23/16; since I last saw this 3 weeks ago, the open large area on her right leg looks about the same syrup. She has a small satellite lesion just underneath this. The area on her medial right ankle is now a deep necrotic wound. I attempted to debridement this however there is just too much pain. 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 She follows with vascular surgery and is scheduled for an angiogram in early September 09/06/16; the patient is going for an arteriogram tomorrow. Her original large wound on the right calf is about the same the satellite lesion underneath it is about the same however the area on her medial ankle is now deeper with exposed tendon. I am no longer attempting to  debride these wounds 09/20/16; the patient has undergone a right femoral endarterectomy and Dacron patch angioplasty. This seems to have helped the flow in her right leg. 10/04/16; Arrived today for aggressive debridement of the wounds on her right calf the original wound the one beneath it and a difficult area over her right lateral leg just above the lateral malleolus. 10/25/16; her 3 open wounds are about the same in terms of dimensions however the surface appears a lot healthier post debridement. Using Iodoflex 10/18/16 we have been using Iodoflex to her wounds which she tolerated with some difficulty. 10/11/16; has been using Santyl for a period of time with some improvement although again very adherent surface slough would prevent any attempted healing this. She has a original wound on the left calf, the satellite underneath that and the most recent wound on the right medial ankle. She has completed revascularization by Dr. early and has had venous ablation earlier. Want to go back to Iodoflex to see if week and get a healthier surface to this wound bed failing this I think she'll need to be taken to the OR and I am prepared to call Dr. Marla Roe to discuss this. She is obviously not a good candidate for general anesthesia however.; 11/08/16; I put her on Iodoflex last time to see if I can get the wound bed any healthier and unfortunately today still had tremendous surface slough. 11/15/16; 4 weeks' worth of Iodoflex with not much improvement. Debridement on the major wounds on her left anterior leg is easier however this does not maintain from week to week. The punched-out area on her medial right ankle 11/29/16; I attempted to change the patient last visit to [facility] however she states this burned and was very uncomfortable therefore we gave her permission to go back to the eye out of complex which she already had at home. Also she noted a lot of pain and swelling on the lateral aspect of  her leg before she traveled to [facility] for the holidays, I called her in doxycycline over the phone. This seems to have helped 12/06/16; Wounds unchanged by in large. Using Iodoflex 12/13/16; her wounds today actually looks somewhat better. The area on the right lateral lower leg has reasonably healthy-looking granulation and perhaps as actually filled  and a bit. Debridement of the 2 wounds on the medial calf is easier and post debridement appears to have a healthier base. We have referred her to Dr. Migdalia Dk for consideration of operative debridement 12/20/16; we have a quick note from Dr. Merri Ray who feels that the patient needs to be referred to an academic center/plastic surgeon. This is due to the complexity of the patient's medical issues as it applies to anything in the OR. We have been using Iodoflex 12/27/16; in general the wounds on the right leg are better in terms of the difficult to remove surface slough. She has been using Iodoflex. She is approved for Apligraf which I anticipated ordering in the next week or 2 when we get a better-looking surface 01/04/16 the deep wounds on the right leg generally look better. Both of them are debrided further surface slough. The area on the lateral right leg was not debrided. She is approved for Apligraf I think I'll probably order this either next week or the week after depending on the surface of the wounds superiorly. We have been using Iodoflex which will continue until then 01/11/16; the deep wounds on the right leg again have a surface slough that requires debridement. I've not been able to get the wound bed on either one of these wounds down to what would be acceptable for an advanced skin stab-like Apligraf. The area on the medial leg has been improving. We have been using hot Iodoflex to all wounds which seems to do the best at at least limiting the nonviable surface 01/24/17; we have continued Iodoflex and all her wound  areas. Her debridement Gen. he is easier and the surface underneath this looks viable. Nevertheless these are large area wounds with exposed muscle at least on the anterior parts. We have ordered Apligraf's for 2 weeks from now. The patient will be away next week 02/07/17; the patient was close to have first Apligraf today however we did not order one. I therefore replaced her Iodoflex. She essentially has 3 large punched- out areas on her right anterior leg and right medial ankle. 02/11/17; Apligraf #1 02/25/17; Apligraf #2. In general some improvement in the right medial ankle and right anterior leg wounds. The larger wound medially perhaps some better 03/11/17; Apligraf #3. In general continued improvement in the right medial ankle and the right anterior leg wounds. 03/25/17 Apligraf #4. In general continued improvement especially on the right medial ankle and the lower 04/08/17; Apligraf #5 in general continued improvement in all wound sites. 04/22/17; post Apligraf #5 her wound beds continued to look a lot better all of them up to the surface of the surrounding skin. Had a caramel-colored slough that I did not debrided in case there is residual Apligraf effect. The wounds are as good as I have seen these looking quite some time. 04/29/17; we applied Nara Visa and last week after completing her Apligraf. Wounds look as though they've contracted somewhat although they have a nonviable surface which was problematic in the past. Apparently she has been approved for further Apligraf's. We applied Iodosorb today after debridement. 05/06/17; we're fortunate enough today to be able to apply additional Apligraf approved by her insurance. In general all of her wounds look better Apligraf #6 05/24/17; Apligraf #7 continued improvement in all wounds o3 06/10/17 Apligraf #8. Continued improvement in the surface of all wounds. Not much of an improvement in dimensions as I might a follow 06/24/17 Apligraf #9 continued  general improvement although not as much  change in the wound areas I might of like. She has a new open area on the right anterior lateral ankle very small and superficial. She also has a necrotic wound on the tip of her right index finger probably secondary to severe uncontrolled Raynaud's phenomenon. She is already on sildenafil and already seen her rheumatologist who gave her Keflex. 07/08/17; Apligraf #10. Generalized improvement although she has a small additional wound just medial to the major wound area. 07/22/17; after discussion we decided not to reorder any further Apligraf's although there is been considerable improvement with these it hasn't been recently. The major wound anteriorly looks better. Smaller wounds beneath this and the more recent one and laterally look about the same. The area on the right lateral lower leg looks about the same 07/29/17 this is a patient who is exceedingly complex. She has advanced scleroderma, crest syndrome including calcinosis, PAD status post revascularization, chronic venous insufficiency status post ablations. She initially presented to this clinic with wounds on her bilateral lower legs however these closed. More recently we have been dealing with a large open area superiorly on the right anterior leg, a smaller wound underneath this and then another one on the right medial lower extremity. These improve significantly with 10 Apligraf applications. Over the last 2-3 weeks we are making good progress with Hydrofera Blue and these seem to be making progress towards closure 08/19/17; wounds continued to make good improvement with Hydrofera Blue and episodic aggressive debridements 08/26/17; still using Hydrofera Blue. Good improvements 09/09/17; using Hydrofera Blue continued improvement. Area on the lateral part of her right leg has only a small remaining open area. The small inferior satellite region is for all intents and purposes closed. Her major wound also  is come in in terms of depth and has advancing epithelialization. 09/16/17; using Hydrofera Blue with continued improvement. The smaller satellite wound. We've closed out today along with a new were satellite wound medially. The area on her medial ankle is still open and her major wound is still open but making improvement. All using Hydrofera Blue. Currently 09/30/17; using Hydrofera Blue. Still a small open area on the lateral right ankle area and her original major wound seems to be making gradual and steady improvement. 10/14/17; still using Hydrofera Blue. Still too small open areas on the right lateral ankle. Her original major wound is horizontal and linear. The most problematic area paradoxically seems to be the area to the medial area wears I thought it would be the lateral. 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 10/28/17; still using Hydrofera Blue. Right lateral ankle has a very small open area superiorly on the most lateral part of the wound. Her original open wound has 2 open areas now separated by normal skin and we've redefined this. 11/11/17; still using Hydrofera Blue area and right lateral ankle continues to have a small open area on mostly the lateral part of the wound. Her original wound has 2 small open areas now separated by a considerable amount of normal skin 11/28/17; the patient called in slightly before Thanksgiving to report pain and erythema above the wound on the right leg. In the past this is responded well to treatment for cellulitis and I gave her over the phone doxycycline. She stated this resulted in fairly abrupt improvement. We have been using Hydrofera Blue for a prolonged period of time to the larger wound anteriorly into the remaining wound on her right right lateral  ankle. The latter is just about closed with only a small linear area and the bottom of the [place]. 12/02/17; use endoform and left the dressing on  since last visit. There is no tenderness and no evidence of infection. 12/16/17; patient has been using Endoform but not making much progress. The 2 punched out open areas anteriorly which were the reminiscence of her major wound appear deeper. The area on the lateral aspect of her right calf also appears deeper. Also she has a puzzling tender swelling above her wound on the right leg. This seems larger than last time. Just above her wounds there appears to be some fluctuance in this area it is not erythematous and there is no crepitus 12/30/17; patient has been using Endoform up until last week we used Hydrofera Blue. Ultrasound of the swelling above her 2 major wounds last time was negative for a fluid collection. I gave her cefaclor for the erythema and tenderness in this area which seems better. Unfortunately both punched out areas anteriorly and the area on her right medial lower leg appear deeper. In fact the lateral of the wounds anteriorly actually looks as though it has exposed tendon and/or muscle sheath. She is not systemically unwell. She is complaining of vaginitis type symptoms presumably Candida from her antibiotics. 01/06/18; we're using santyl. she has 2 punched out areas anteriorly which were initially part of a large wound. Unfortunately medially this is now open to tendon/muscle. All the wounds have the same adherent very difficult to debride surface. 01/20/18; 2 week follow-up using Santyl. She has the 2 punched out areas anteriorly which were initially part of her large surface wound there. Medially this still has exposed muscle. All of these have the same tightly adherent necrotic surface which requires debridement. PuraPly was not accepted by the patient's insurance however her insurance I think it changed therefore we are going to run Apligraf to gain 02/03/18; the patient has been using Santyl. The wound on the right lateral ankle looks improved and the 2 areas anteriorly on the  right leg looks about the same. The medial one has exposed muscle. The lateral 1 requiresdebridement. We use PuraPly today for the first week 02/10/18; PuraPly #2. The patient has 3 wounds. The area on the right lateral ankle, 2 areas anteriorly that were part of her original large wound in this area the medial one has exposed muscle. All of the wounds were lightly debrided with a number 3 curet. PuraPly #2 applied the lateral wound on the calf and the right lateral ankle look better. 02/17/18; PuraPly #3. Patient has 3 wounds. The area on the right lateral ankle in 2 areas internally that were part of her original large wound. The lateral area has exposed muscle. She arrived with some complaints of pain around the right ankle. 02/24/18; PuraPly #4; not much change in any of the 3 wound areas. Right lateral ankle, right lateral calf. Both of these required debridement with a #3 curet. She tolerates this marginally. The area on the medial leg still has exposed muscle. Not much change in dimensions 03/03/18 PuraPly #5. The area on the medial ankle actually looks better however the 2 separate areas that were original parts of the larger right anterior leg wound look as though they're attempting to coalesce. 03/10/18; PuraPly #6. The area on the medial ankle actually continues to look and measures smaller however the 2 separate areas that were part of the original large wound on the right anterior leg have now  coalesced. There hasn't been much improvement here. The lateral area actually has underlying exposed muscle 03/17/18-she is here in follow-up evaluation for ulcerations to the right lower extremity. She is voicing no complaints or concerns. She tolerated debridement. Puraply#7 placed 03/24/18; difficult right lower extremity ulcerations. PuraPly #8 place. She is been approved for [employer]. She did very well with Apligraf today however she is apparently reached her "lifetime max" 03/31/18; marginal  improvement with PuraPly although her wounds looked as good as they have in several weeks today. Used TheraSkins #1 04/14/18 TheraSkin #2 today 04/28/18 TheraSkins #3. Wound slightly improved 05/12/18; TheraSkin skin #4. Wound response has been variable. 05/27/18 TheraSkin #5. Generally improvement in all wound areas. I've also put her in 3 layer compression to help with the severe venous hypertension 06/09/18; patient is done quite well with the TheraSkins unfortunately we have no further applications. I also put her in 3 layer compression last week and that really seems to of helped. 06/16/18; we have been using silver collagen. Wounds are smaller. Still be open area to the muscle layer of her calf however even that is contracted somewhat. She tells me that at night sometimes she has pain on the right lateral calf at the site of her lower wound. Notable that I put her into 3 layer compression about 3 weeks ago. She states that she dangles her leg over the bed that makes it feel better but she does not describe claudication during the day ooShe is going to call her secondary insurance to see if they will continue to cover advanced treatment products I have reviewed her arterial studies from 01/22/17; this showed an ABI in the right of 1 and on the left noncompressible. TBI on the right at 0.30 on the left at 0.34. It is therefore possible she has significant PAD with medial calcification falsely elevating her ABI into the normal range. I'll need to be careful about asking her about this next week it's possible the 3 layer compression is too much 06/23/18; was able to reapply TheraSkin 1 today. Edema control is good and she is not complaining of pain no claudication 07/07/18;no major change. New wound which was apparently a taper removal injury today in our clinic between her 2 wounds on the right calf TheraSkins #2 07/14/18; I think there is some improvement in the right lateral ankle and the medial part of  her wound. There is still exposed muscle medially. 07/28/18; two-week follow-up. TheraSkins#3. Unfortunately no major change. She is not a candidate I don't think for skin grafting due to severe venous hypertension associated with her scleroderma and pulmonary hypertension 08/11/18 Patient is here today for her [employer]ask[employer] application #97 (#5 of the second set). She seems to be doing well and in the base of the wound appears to show some progress at this point. This is the last approved Theraskin of the second set. 08/25/18; she has completed TheraSkin. There has been some improvement on the right lateral calf wound as well as the anterior leg wounds. The open area to muscle medially on the anterior leg wound is smaller. I'm going to transition her back to [facility] under Kerlix Coban change every second day. She reports that she had some calcification removed from her right upper arm. We have had previous problems with calcifications in her wounds on her legs but that has not happened recently 09/08/18;using Hydrofera Blue on both her wound areas. Wounds seem to of contracted nicely. She uses Kerlix Coban wrap and changes  at home herself 09/22/2018; using Hydrofera Blue on both her wound areas. Dimensions seem to have come down somewhat. There is certainly less depth in the medial part of the mid tibia wound and I do not think there is any exposed muscle at this point. 10/06/2018; 2-week follow-up. Using [facility] on her wound areas. Dimensions have come down nicely both on the right lateral ankle area in the right mid tibial area. She has no new complaints 10/20/2018; 2-week follow-up. She is using Hydrofera Blue. Not much change from the last time she was here. The area on the lateral ankle has less depth although it has raised edges on one side. I attempted to remove as much of the raised edge as I could without creating more additional wounds. The area on the right anterior mid tibia area looks  the same. 11/03/2018; 2-week follow-up she is using Hydrofera Blue. On the right anterior leg she now has 2 wounds separated by a large area of normal skin. The area on the medial part still has I think exposed muscle although this area itself is a lot smaller. The area laterally has some depth. Both areas with necrotic debris. ooThe area on her right lateral ankle has come in nicely 11/17/2018; patient continues to use Hydrofera Blue. We have been increasing separation of the 2 wounds anteriorly which were at one point joint the area on the right lateral calf continues to have I think some improvement in depth. 12/08/2018; patient continues to use Hydrofera Blue. There is some improvement in the area on the right lateral calf. The 2 areas that were initially part of the original large wound in the mid right tibia are probably about the same. In fact the medial area is probably somewhat larger. We will run puraply through the patient's insurance 12/22/2018; she has been using Hydrofera Blue. We have small wounds on the right lateral calf and 2 small areas that were initially part of a large wound in the right mid tibia. We applied pure apply #1 today. 12/29/2018; we applied puraply #2. Her wounds look somewhat better especially on the right lateral calf and the lateral part of her original wound in the mid tibial area. 01/05/2019; perhaps slightly improved in terms of wound bed condition but certainly not as much improvement as I might of liked. Puraply #3 1/13: we did not have a correct sized puraply to apply. wounds more pinched out looking, I increased her compression to 3 layer last week to help with significant multilevel venous hypertension. Since then I've reviewed her arterial status. She has a right femoral endarterectomy and a distal left SFA stent. She was being followed by Dr. [name] for a period however she does not appear to have seen him in 3 years. I will set up an appointment. 1/20. The  patient has an additional wound on the right lateral calf between the distal wound and proximal wounds. We did not have Puraply last week. Still does not have a follow-up with Dr. early 1/27: Follow-up with Dr. early has been arranged apparently with follow-up noninvasive studies. Wounds are measuring roughly the same although they certainly look smaller 2/3; the patient had non invasive studies. Her ABIs on the right were 0.83 and on the left 1.02 however there was no great toe pressure bilaterally. Also worrisome monophasic waveforms at the PTA and dorsalis pedis. We are still using Puraply. We have had some improvement in all of the wounds especially the lateral part of the mid tibial  area. 2/10; sees Dr. early of vein and vascular re-arterial studies next week. Puraply reapplied today. 2/17; Dr. early of vein and vascular his appointment is tomorrow. Puraply reapplied after debridement of all wounds 2/24; the patient saw Dr. early I reviewed his note. sHe noted the previous right femoral endarterectomy with a Dacron patch. He also noted the normal ABI and the monophasic waveforms suggesting tibial disease. Overall he did not feel that she had any evidence of arterial insufficiency that would impair her wound healing. He did note her venous disease as well. He suggested PRN follow-up. 3/2; I had the last puraply applied today. The original wounds over the mid tibia area are improved where is the area on the right lower leg is not 3/9; wounds are smaller especially in the right mid tibia perhaps slightly in the right lateral calf. We finished with puraply and went to endoform today 3/23; the patient arrives after 2 weeks. She has been using endoform. I think all of her wounds look slightly better which includes the area on the right lateral calf just above the right lateral malleolus and the 2 in the right mid tibia which were initially part of the same wound. 4/27; TELEHEALTH visit; the patient  was seen for telehealth visit today with her consent in the middle of the worldwide epidemic. Since she was last here she called in for antibiotics with pain and tenderness around the area on the right medial ankle. I gave her empiric doxycycline. She states this feels better. She is using endoform on both of these areas 5/11 TELEHEALTH; the patient was seen for telehealth visit today. She was accompanied at home by her husband. She has severe pulmonary hypertension accompanied scleroderma and in the face of the Covid epidemic cannot be safely brought into our clinic. We have been using endoform on her wound areas. There are essentially 3 wound areas now in the left mid tibia now 2 open areas that it one point were connected and one on the right lateral ankle just above the malleolus. 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 She cannot come to clinic. The wounds were reviewed today via telehealth. She has severe chronic venous hypertension which I think is centrally mediated. She has wounds on her right anterior tibia and right lateral ankle area. These are chronic. She has been using endoform. 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 We have been following her from telehealth. She has severe chronic venous hypertension which may be mostly centrally mediated secondary to right heart heart failure. She has wounds on her right anterior tibia and right lateral ankle these are chronic we have been using endoform 6/22; TELEHEALTH; this patient was seen today via telehealth. 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 Since the last time we had contact with this patient she developed some pain and erythema around the wound on her right lateral malleolus/ankle and we put in antibiotics for her. This is resulted in good improvement with resolution of the erythema and tenderness. I changed her to silver alginate last time. We had been using endoform for an extended period of time  7/13; TELEHEALTH; this patient was seen today via telehealth. 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 She would continue to be at a prohibitive risk to be brought into our clinic unless this was absolutely necessary. These visits have been done with her approval as well as her husband. We have been using silver alginate to the areas on the right mid tibia and right lateral lower leg. 7/27 TELEHEALTH; patient was seen along with her husband today via telehealth. 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 We changed her to sample last visit. She has 2 areas a chronic wound on her right mid tibia and one just above her ankle. These were not the original wounds when she came into this clinic but she developed them during treatment 8/17; she comes in for her first face-to-face visit in a long period. She has a remaining area just medial to the right tibia which is the last open part of her large wound across this area. She also has an area on the right lateral lower leg. We prescribed Santyl last telehealth visit but they were concerned that this was making a deeper so they put silver alginate on it last week. Her husband changes the dressings. 8/31; using Santyl to the 2 wound areas some improvement in wound surfaces. Husband has surgery in 2 weeks we will put her out 3 weeks. Any of the advanced treatment options that I can think of that would be eligible for this wound would also cause her to have to come in  weekly. The risk that the patient is just too high 9/21. Using Santyl to the 2 wound areas. Both of these are somewhat better although the medial mid tibia area still has exposed muscle. Lateral ankle requiring debridement. Using Santyl 10/12; using Santyl to the 2 wound areas. One on the right lateral ankle and the other in the medial calf which still has exposed muscle. Both areas have come down in size and have better looking surfaces. She has made nice progress with santyl 11/2; we have been using Santyl to the 2 wound areas. Right lateral ankle and the other in the mid tibia area the medial part of this still has exposed muscle. 11/23/2019 on evaluation today patient appears to be doing about the same really with regard to her wounds. She is actually not very pleased with how things seem to be progressing at this point she tells me that she really has not noted much improvement unfortunately. With that being said there is no signs of active infection at this time. There is some slough buildup noted at this time which again along with some dry skin around the edges of the wound I think would benefit her to try to debride some of this away. Fortunately her pain is doing fairly well. She still has exposed muscle in the right medial/tibial area. 12/14; TELEHEALTH; she was changed to [facility] to the right calf wound and right lateral ankle wound when she was here last time. Unfortunately since then she had a fall with a pelvic fracture and a fracture of her wrist. She was apparently hospitalized for 5 days. I have not looked at her discharge summary. She apparently came out of the hospital with a blister on her right heel. She was seen today via telehealth by myself and our case manager. The patient and her husband were present. She has  been using Hydrofera Blue at our direction from the last time she was in the clinic. There is been no major improvement in fact the areas appear deeper and with  a less viable surface 01/04/2020; TELEHEALTH; the patient was seen today in accompaniment of her husband and our nurse. She has 3 open areas 1 on the right medial mid tibia, one on the right lateral ankle and a large eschared pressure area on the right heel. We have been using Iodoflex to the 2 original wounds. 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 It is simply too perilous for her to be seen in any other way 1/26; TELEHEALTH; the patient was seen today in accompaniment of her husband and our RN one of our nurses. She still has the 3 open areas 1 on the right medial mid tibia which is the remanent of a more extensive wound in this area, one on the right lateral ankle and a large eschared pressure area on the right heel. We have been using Iodoflex to the 2 original wounds and a bed at 9 application to the eschared area on the heel 2/15; the first time we have seen this patient and then several months out of concern for the pandemic. She had a large horizontal wound in the mid tibia. Only the medial aspect of this is still open. Area on the lateral ankle is just about closed. She had a new pressure ulcer on the right heel which I have removed some of the eschar. We have been using Iodoflex which I will continue. The area in the mid tibia has a round circle in the middle of exposed muscle. I think we would have to use an advanced treatment product to stimulate granulation over this area. We will run this through her insurance. She is not eligible for plastic surgery for many reasons 3/1; TELEHEALTH. The patient was seen today by telehealth. 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 We have been using Iodoflex to the wound areas which include the right anterior mid tibia, right lateral ankle and the right heel. All of these were reviewed 3/8; the patient's wound just above  the right ankle is closed. She still has a contracting black eschar on the heel where she had a pressure ulcer. The medial part of her original wound on the mid tibia has exposed muscle. We have made really made no progress in this area although we have managed to get a lot of the original wound in this area to close. We used Apligraf #1 today 3/22; the patient's ankle wound remains closed. She still has a contracting black eschar on the heel although it seems to have less surface area. It still not clear whether there is any depth here. We used Apligraf #2 today in an attempt to get granulation over the exposed muscle and what is left of her mid tibia area. 4/5; everything is closed except the medial aspect of the mid tibia wound, as well as the pressure injury on the right heel. She has been using Betadine to the right heel which has been gradually contracting. We used Apligraf #3 today. 4/19; Apligraf #4. Still a pressure area on the right heel she has been using Betadine superficial excoriated areas she has been applying salicylic acid to on the anterior leg and the thick horn on the leg just above her wound area 5/3; Apligraf #5. Unfortunately she  came in today with a reopening of the area on the right lateral lower leg. Not much change in the wound we have been treating in the mid calf. Quite a bit of edema surrounding this wound. She reports that she was up on her feet quite a bit. The area on the right heel is separating she has been using Betadine She comes in with a new wound on the left lateral malleolus which is very disappointing this is been open since last week 5/17; we applied her last Apligraf last time. Unfortunately that did not have any effect on the deep area medially in the mid tibial area. However the area over the right lateral malleolus is a lot better. Right heel also is contracted we used Iodoflex last time. The new wound on the left lateral malleolus were using Santyl to.  This required debridement. 6/1. Not much change in the area on the right medial mid tibia. Right lateral ankle is improved she has the necrotic area on the right heel which was a pressure area. New area on the left lateral malleolus from last time. I changed her to Iodoflex to the area on the left lateral malleolus she said this hurt I have put her back on Santyl 6/15; right mid tibia is unchanged. I do not think there is anything I can do to this topically that we will get this to granulate over the muscle. And I am furthermore I am not sure that she is a surgical candidate either because of her severe pulmonary issues or because she really does not want to go through it. ooThe area on the right lateral malleolus is a small punched out area. ooThe area on the left lateral malleolus again small punched out with nonviable surface ooPressure ulcer on the right heel at separating black eschar. I went ahead and remove this today Lura Em has an area on the left anterior mid tibia just laterally. Geographic wounds debris on the surface. Some erythema here. 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 There is skin changes widely Change the primary dressing to Sorbact to all wound areas. She needs compression on the left leg as well as the right 6/21; right mid tibia which was her one remaining wound at 1 point is unchanged ooThe area on the right lateral malleolus actually is close to closing over still a small punched out area ooThe area on the left lateral malleolus again a deep wound it is this 1 that she feels pain in. ooPressure ulcer on the right heel was cultured today. ooNew area on the left anterior mid tibia several geographic wounds last week in the setting of chronic venous insufficiency this actually looks some better 7/6; ooRight mid tibia about the same. Exposed muscle ooLeft lateral malleolus again necrotic debris over the surface. ooPressure ulcer on the  right heel. She finished the Keflex we prescribed. Necrotic debris debrided with a #3 curette ooThe rest of her wounds on the right mid tibia right lateral ankle are better Her husband reminds me that she had a right femoral endarterectomy and has 2 stents in her left thigh placed in 2011 approximately by vascular surgery in [place]. She saw Dr. [name] last in February 2020. At that point he did not think that the arterial insufficiency she had on the right was sufficient to explain any of her right lower extremity wounds however she now has an area on the left lateral malleolus that looks like an ischemic wound 7/12; we have  been using Sorbact all wound areas ooRight mid tibia about the same exposed muscle ooLeft lateral malleolus small wound with debris in the surface punched out ooPressure ulcer of the right heel requiring debridement of necrotic debris ooLateral ankle wound on the right is just about closed ooRight mid tibia wound about the same still with exposed muscle. 7/26 really no change in any wounds. We have been using Sorbact. She has bilateral lower extremity wounds. This is in the setting of scleroderma, severe venous hypertension. She also has known PAD and I had really hoped to be able to get a review by Dr. [name] but apparently that is not booked until August 31. I have asked her to call back to vein and vascular and get an appointment with somebody a little earlier if that is possible. 8/16; patient's area on the right ankle which was a chronic wound is closed over again. The area on the right mid lower leg, right heel left lateral ankle are all about the same. Pressure ulcer on the right heel is still not viable. New areas identified on the right lateral calf. She has a follow-up with Dr. [name] on 8/31. Phenomenally I would like him to go over her arterial status with regards to her underlying stents. 9/13; patient had her ARTERIAL studies done however apparently Dr. [name]  is now in [place] so she did not get an appointment with him on 8/31 and was not referred to any other provider. On the right side her PTA was monophasic. Noncompressible. TBI on the right at 0.41 on the left she was monophasic and noncompressible they did not do a TBI because she had a Band-Aid on her big toe. Patient does not describe claudication although she is having a lot of pain in the left lateral malleolus. 10/11; the patient had a right-sided interventions on 10/5. She had a angioplasty of the right anterior tibial artery as well as the tibioperoneal trunk/peroneal artery she had a drug-coated balloon angioplasty of the right superficial femoral artery. On the left she did not have any interventions yet but is going to have another look at this in 2 weeks. The superficial femoral artery on the left and the associated stent are patent popliteal artery is patent to the dominant vessel runoff is the anterior tibial which has several high-grade stenosis in the proximal one third. We have been using Sorbact. She also has a new area on the right upper mid tibia. 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 She tolerated this well. She is using Iodoflex and her husband is doing a kerlix Coban on her. She arrives in clinic today with the original medial part of her mid tibia wound with muscle exposure small area on the right ankle which was part of her original wound she has 2 punched-out areas laterally and 1 above her area anteriorly. Nonviable surfaces similarly the right heel is nonviable. On the left side she has an area on the left foot and the left lateral ankle similar nonviable surfaces. 11/8; patient arrives in clinic today with nothing  improved. She has a multiple lightly group of wounds on the right lower leg presumably different etiologies including a skin biopsy, the original wound she had medially and some punched-out areas that are not  of clear defined etiology. She says she was in a lot of pain this week could not have her leg up on the bed did not get completely relief from dropping it over the bed. She also has a small area on the left lateral malleolus and the left lateral foot necrotic surfaces on all of these wounds. We have been using Iodoflex I attempted to put her in 3 layer compression last week that did not go well. We will be backing off on kerlix Coban this week. 11/15; the ultrasound that I sent the patient for last week showed findings consistent with acute deep vein thrombosis involving the right distal femoral vein and right popliteal vein. I was concerned about the combination of Plavix and Xarelto however apparently Dr. [name] felt that was not a problem. She is now going through the acute dosing of Xarelto. She has an appointment with Dr. Trula Slade on 12/5. Objective Constitutional Patient is hypertensive.. Pulse regular and within target range for patient.Marland Kitchen Respirations regular, non-labored and within target range.. Temperature is normal and within the target range for the patient.Marland Kitchen Appears in no distress. Vitals Time Taken: 11:34 AM, Height: 68 in, Source: Stated, Weight: 132 lbs, Source: Stated, BMI: 20.1, Temperature: 98.6 F, Pulse: 75 bpm, Respiratory Rate: 18 breaths/min, Blood Pressure: 160/63 mmHg. Cardiovascular Right foot is cool and I had difficulty feeling her pedal pulses. Swelling in the right leg has improved but there is still a firm area in the posterior calf mid aspect which is tender. General Notes: Wound exam ooThe midportion of the right tibia part of her original wound looks about the same. She has the 2 right lateral wounds that are punched-out areas with necrotic debris 1 of  these was a biopsy site the other of unclear etiology. The right lateral ankle part of again her original wound complex is small but with a nonviable surface I did not debride this. ooShe has the same areas on the left lateral foot and left lateral ankle Integumentary (Hair, Skin) Wound #16 status is Open. Original cause of wound was Pressure Injury. The wound is located on the Right Calcaneus. The wound measures 0.6cm length x 1.5cm width x 0.9cm depth; 0.707cm^2 area and 0.636cm^3 volume. There is Fat Layer (Subcutaneous Tissue) exposed. There is no tunneling or undermining noted. There is a small amount of serosanguineous drainage noted. The wound margin is distinct with the outline attached to the wound base. There is small (1- 33%) pink granulation within the wound bed. There is a large (67-100%) amount of necrotic tissue within the wound bed including Adherent Slough. Wound #19 status is Open. Original cause of wound was Gradually Appeared. The wound is located on the Right,Lateral Malleolus. The wound measures 0.3cm length x 0.2cm width x 0.2cm depth; 0.047cm^2 area and 0.009cm^3 volume. There is Fat Layer (Subcutaneous Tissue) exposed. There is no tunneling or undermining noted. There is a small amount of serous drainage noted. The wound margin is distinct with the outline attached to the wound base. There is no granulation within the wound bed. There is a large (67-100%) amount of necrotic tissue within the wound bed including Adherent Slough. Wound #20 status is Open. Original cause of wound was Gradually Appeared. The wound is located on the Left,Lateral Malleolus. The wound measures 1cm length x 0.8cm width x 0.1cm depth; 0.628cm^2 area and 0.063cm^3 volume. There is Fat Layer (Subcutaneous Tissue) exposed. There is no tunneling or undermining noted. There is a none present amount of drainage noted. The  wound margin is distinct with the outline attached to the wound base. There is medium  (34-66%) red granulation within the wound bed. There is a medium (34-66%) amount of necrotic tissue within the wound bed including Adherent Slough. Wound #23 status is Open. Original cause of wound was Gradually Appeared. The wound is located on the Right,Posterior Lower Leg. The wound measures 2.2cm length x 2.9cm width x 0.4cm depth; 5.011cm^2 area and 2.004cm^3 volume. There is Fat Layer (Subcutaneous Tissue) exposed. There is no tunneling or undermining noted. There is a medium amount of purulent drainage noted. The wound margin is distinct with the outline attached to the wound base. There is no granulation within the wound bed. There is a large (67-100%) amount of necrotic tissue within the wound bed including Adherent Slough. Wound #24 status is Open. Original cause of wound was Gradually Appeared. The wound is located on the Right,Anterior Lower Leg. The wound measures 1.4cm length x 0.9cm width x 0.4cm depth; 0.99cm^2 area and 0.396cm^3 volume. There is Fat Layer (Subcutaneous Tissue) exposed. There is no tunneling or undermining noted. There is a small amount of serous drainage noted. The wound margin is distinct with the outline attached to the wound base. There is small (1-33%) pink granulation within the wound bed. There is a large (67-100%) amount of necrotic tissue within the wound bed including Adherent Slough. Wound #25 status is Open. Original cause of wound was Gradually Appeared. The wound is located on the Left,Lateral Foot. The wound measures 0.8cm length x 0.7cm width x 0.1cm depth; 0.44cm^2 area and 0.044cm^3 volume. There is Fat Layer (Subcutaneous Tissue) exposed. There is no tunneling or undermining noted. There is a none present amount of drainage noted. The wound margin is distinct with the outline attached to the wound base. There is no granulation within the wound bed. There is a large (67-100%) amount of necrotic tissue within the wound bed including Adherent  Slough. General Notes: scabbed Wound #26 status is Open. Original cause of wound was Gradually Appeared. The wound is located on the Right,Lateral Lower Leg. The wound measures 1.4cm length x 0.9cm width x 0.4cm depth; 0.99cm^2 area and 0.396cm^3 volume. There is Fat Layer (Subcutaneous Tissue) exposed. There is no tunneling or undermining noted. There is a medium amount of purulent drainage noted. The wound margin is flat and intact. There is no granulation within the wound bed. There is a large (67-100%) amount of necrotic tissue within the wound bed including Adherent Slough. Wound #27 status is Open. Original cause of wound was Gradually Appeared. The wound is located on the Right,Distal,Anterior Lower Leg. The wound measures 0.5cm length x 0.7cm width x 0.2cm depth; 0.275cm^2 area and 0.055cm^3 volume. There is Fat Layer (Subcutaneous Tissue) exposed. There is no tunneling or undermining noted. There is a small amount of serous drainage noted. The wound margin is flat and intact. There is small (1-33%) pink granulation within the wound bed. There is a large (67-100%) amount of necrotic tissue within the wound bed including Adherent Slough. Wound #5 status is Open. Original cause of wound was Gradually Appeared. The wound is located on the Right,Medial Lower Leg. The wound measures 1.9cm length x 2.4cm width x 0.4cm depth; 3.581cm^2 area and 1.433cm^3 volume. There is Fat Layer (Subcutaneous Tissue) exposed. There is no tunneling or undermining noted. There is a medium amount of serous drainage noted. The wound margin is distinct with the outline attached to the wound base. There is small (1-33%) pink granulation within the  wound bed. There is a large (67-100%) amount of necrotic tissue within the wound bed including Adherent Slough. 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 There was a Chemical/Enzymatic/Mechanical debridement performed by [name] Pilling, RN.Marland Kitchen Agent used was [employer]. There was no bleeding. The procedure was tolerated well with a pain level of 0 throughout and a pain level of 0 following the procedure. Post Debridement Measurements: 0.6cm length x 1.5cm width x 0.9cm depth; 0.636cm^3 volume. Post debridement Stage noted as Category/Stage III. Character of Wound/Ulcer Post Debridement requires further debridement. Post procedure Diagnosis Wound #16: Same as Pre-Procedure Wound #19 Pre-procedure diagnosis of Wound #19 is a Venous Leg Ulcer located on the Right,Lateral Malleolus .Severity of Tissue Pre Debridement is: Fat layer exposed. There was a Chemical/Enzymatic/Mechanical debridement performed by [name] Pilling, RN.Marland Kitchen Agent used was [employer]. There was no bleeding. The procedure was tolerated well with a pain level of 0 throughout and a pain level of 0 following the procedure. Post Debridement Measurements: 0.3cm length x 0.2cm width x 0.2cm depth; 0.009cm^3 volume. Character of Wound/Ulcer Post Debridement requires further debridement. Severity of Tissue Post Debridement is: Fat layer exposed. Post procedure Diagnosis Wound #19: Same as Pre-Procedure Wound #20 Pre-procedure diagnosis of Wound #20 is an Arterial Insufficiency Ulcer located on the  Left,Lateral Malleolus .Severity of Tissue Pre Debridement is: Fat layer exposed. There was a Chemical/Enzymatic/Mechanical debridement performed by [name] Pilling, RN.Marland Kitchen Agent used was [employer]. There was no bleeding. The procedure was tolerated well with a pain level of 0 throughout and a pain level of 0 following the procedure. Post Debridement Measurements: 1cm length x 0.8cm width x 0.1cm depth; 0.063cm^3 volume. Character of Wound/Ulcer Post Debridement requires further debridement. Severity of Tissue Post Debridement is: Fat layer exposed. Post procedure Diagnosis Wound #20: Same as Pre-Procedure Wound #23 Pre-procedure diagnosis of Wound #23 is a Venous Leg Ulcer located on the Right,Posterior Lower Leg .Severity of Tissue Pre Debridement is: Fat layer exposed. There was a Chemical/Enzymatic/Mechanical debridement performed by [name] Pilling, RN.Marland Kitchen Agent used was [employer]. There was no bleeding. The procedure was tolerated well with a pain level of 0 throughout and a pain level of 0 following the procedure. Post Debridement Measurements: 2.2cm length x 2.9cm width x 0.4cm depth; 2.004cm^3 volume. Character of Wound/Ulcer Post Debridement requires further debridement. Severity of Tissue Post Debridement is: Fat layer exposed. Post procedure Diagnosis Wound #23: Same as Pre-Procedure Wound #24 Pre-procedure diagnosis of Wound #24 is a Venous Leg Ulcer located on the Right,Anterior Lower Leg .Severity of Tissue Pre Debridement is: Fat layer exposed. There was a Chemical/Enzymatic/Mechanical debridement performed by [name] Pilling, RN.Marland Kitchen Agent used was [employer]. There was no bleeding. The procedure was tolerated well with a pain level of 0 throughout and a pain level of 0 following the procedure. Post Debridement Measurements: 1.4cm length x 0.9cm width x 0.4cm  depth; 0.396cm^3 volume. Character of Wound/Ulcer Post Debridement requires further debridement. Severity of Tissue Post Debridement is: Fat layer  exposed. Post procedure Diagnosis Wound #24: Same as Pre-Procedure Wound #25 Pre-procedure diagnosis of Wound #25 is a Venous Leg Ulcer located on the Left,Lateral Foot .Severity of Tissue Pre Debridement is: Fat layer exposed. There was a Chemical/Enzymatic/Mechanical debridement performed by [name] Pilling, RN.Marland Kitchen Agent used was [employer]. There was no bleeding. The procedure was tolerated well with a pain level of 0 throughout and a pain level of 0 following the procedure. Post Debridement Measurements: 0.8cm length x 0.7cm width x 0.1cm depth; 0.044cm^3 volume. Character of Wound/Ulcer Post Debridement requires further debridement. Severity of Tissue Post Debridement is: Fat layer exposed. Post procedure Diagnosis Wound #25: Same as Pre-Procedure Wound #26 Pre-procedure diagnosis of Wound #26 is a Venous Leg Ulcer located on the Right,Lateral Lower Leg .Severity of Tissue Pre Debridement is: Fat layer exposed. There was a Chemical/Enzymatic/Mechanical debridement performed by [name] Pilling, RN.Marland Kitchen Agent used was [employer]. There was no bleeding. The procedure was tolerated well with a pain level of 0 throughout and a pain level of 0 following the procedure. Post Debridement Measurements: 1.4cm length x 0.9cm width x 0.4cm depth; 0.396cm^3 volume. Character of Wound/Ulcer Post Debridement requires further debridement. Severity of Tissue Post Debridement is: Fat layer exposed. Post procedure Diagnosis Wound #26: Same as Pre-Procedure Wound #27 Pre-procedure diagnosis of Wound #27 is a Venous Leg Ulcer located on the Right,Distal,Anterior Lower Leg .Severity of Tissue Pre Debridement is: Fat layer exposed. There was a Chemical/Enzymatic/Mechanical debridement performed by [name] Pilling, RN.Marland Kitchen Agent used was [employer]. There was no bleeding. The procedure was tolerated well with a pain level of 0 throughout and a pain level of 0 following the procedure. Post Debridement Measurements: 0.5cm length x 0.7cm width  x 0.2cm depth; 0.055cm^3 volume. Character of Wound/Ulcer Post Debridement requires further debridement. Severity of Tissue Post Debridement is: Fat layer exposed. Post procedure Diagnosis Wound #27: Same as Pre-Procedure Wound #5 Pre-procedure diagnosis of Wound #5 is a Venous Leg Ulcer located on the Right,Medial Lower Leg .Severity of Tissue Pre Debridement is: Fat layer exposed. There was a Chemical/Enzymatic/Mechanical debridement performed by [name] Pilling, RN.Marland Kitchen Agent used was [employer]. There was no bleeding. The procedure was tolerated well with a pain level of 0 throughout and a pain level of 0 following the procedure. Post Debridement Measurements: 1.9cm length x 2.4cm width x 0.4cm depth; 1.433cm^3 volume. Character of Wound/Ulcer Post Debridement requires further debridement. Severity of Tissue Post Debridement is: Fat layer exposed. Post procedure Diagnosis Wound #5: Same as Pre-Procedure Plan Follow-up Appointments: Return Appointment in 2 weeks. Dressing Change Frequency: Change Dressing every other day. - all wounds Skin Barriers/Peri-Wound Care: Moisturizing lotion - both legs Wound Cleansing: May shower with protection. Primary Wound Dressing: Wound #16 Right Calcaneus: Santyl Ointment Wound #19 Right,Lateral Malleolus: Santyl Ointment Wound #20 Left,Lateral Malleolus: Santyl Ointment Wound #23 Right,Posterior Lower Leg: Santyl Ointment Wound #24 Right,Anterior Lower Leg: Santyl Ointment Wound #25 Left,Lateral Foot: Santyl Ointment Wound #26 Right,Lateral Lower Leg: Santyl Ointment Wound #27 Right,Distal,Anterior Lower Leg: Santyl Ointment Wound #5 Right,Medial Lower Leg: Santyl Ointment Secondary Dressing: Dry Gauze - all wounds Edema Control: Kerlix and Coban - Bilateral Avoid standing for long periods of time Elevate legs to the level of the heart or above for 30 minutes daily and/or when sitting, a frequency of: - throughout the day Off-Loading: Turn  and reposition every 2 hours Other: - float heels off of bed/chair  with pillow under calves The following medication(s) was prescribed: Santyl topical 250 unit/gram ointment ointment topical to wound change daily starting 11/14/2020 1. I change the primary dressing to Santyl. Her husband will change the Kerlix Coban 2. Her posterior calf is certainly a lot less swollen especially proximally however she still has a tender area just above the wounds. This does not look to be infected 3. We will be wrapping Kerlix and Coban bilaterally 4. She sees Dr. Trula Slade on 12/5 I am a bit concerned about the vascular status/arterial status on the right. 5. Certainly no improvement in any of the wound areas. Electronic Signature(s) Signed: 11/14/2020 5:39:18 PM By: [name] Ham MD Signed: 11/14/2020 5:51:53 PM By: [name] Hurst RN, BSN Entered By: [name] [name] on 11/14/2020 14:43:26 -------------------------------------------------------------------------------- SuperBill Details Patient Name: Date of Service: [name], JO [employer] F. 11/14/2020 Medical Record Number: [id number] Patient Account Number: [id number] Date of Birth/Sex: Treating RN: 1951-06-16 (69 y.o. 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 I87.331 Chronic venous hypertension (idiopathic) with ulcer and inflammation of right lower extremity L89.616 Pressure-induced deep tissue damage of right heel L97.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 I70.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 I70.248 Atherosclerosis of native arteries of left leg with ulceration of other part of lower leg Facility Procedures CPT4 Code: [id number] Description: 41287 - DEBRIDE W/O ANES NON SELECT Modifier: Quantity: 1 Physician Procedures : CPT4 Code Description Modifier [id number] - WC PHYS LEVEL 3 - EST PT ICD-10 Diagnosis Description L97.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 L97.811 Non-pressure chronic ulcer of other part of right lower leg limited to  breakdown of skin I70.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

## 2020-11-16 ENCOUNTER — Telehealth: Payer: Self-pay | Admitting: Emergency Medicine

## 2020-11-16 DIAGNOSIS — X32XXXD Exposure to sunlight, subsequent encounter: Secondary | ICD-10-CM | POA: Diagnosis not present

## 2020-11-16 DIAGNOSIS — L570 Actinic keratosis: Secondary | ICD-10-CM | POA: Diagnosis not present

## 2020-11-16 NOTE — Telephone Encounter (Signed)
Called and spoke with patient to let her know response from Dr. Lamonte Sakai. Advised patient that if her shortness of breath continued or got worse then we would need to see her in the office to try and figure out what is causing it. She expressed understanding stated that she would give it a little more time then call us back if she felt like she needed to be seen. Nothing further needed at this time.

## 2020-11-16 NOTE — Telephone Encounter (Signed)
The only reason I could think that starting Xarelto would cause her to have shortness of breath is if it was leading to bleeding of some kind.  I presume that she not seeing any new blood loss since starting the medication.  If her shortness of breath continues I think we probably need to see her in the office to help troubleshoot.

## 2020-11-16 NOTE — Telephone Encounter (Signed)
Called and spoke with pt who states she has been on Xarelto which she began 1 week ago which was prescribed by Dr. Trula Slade. Pt said she has had increased SOB x3-4 days. Pt has been wearing 3L O2 and on the 3L, she was currently satting at 95%.  Pt wants to know if the increased SOB could be coming from the Xarelto. Pt is also on Plavix.  Dr. Lamonte Sakai, please advise.

## 2020-11-18 ENCOUNTER — Telehealth: Payer: Self-pay | Admitting: Emergency Medicine

## 2020-11-18 NOTE — Telephone Encounter (Signed)
Spoke with the pt  She states that her breathing has not improved since she called on 11/16/20  She is still having DOE walking short distances  She is concerned that xarelto causing this, is also taking plavix as well (recent DVT) She is not having any other symptoms- no cough, fever, wheezing, body aches  I wanted to scheduled her for appt for today but nothing available  Pt afraid to go through the weekend with her breathing the way it is  Dr Lamonte Sakai, please advise, thanks!

## 2020-11-18 NOTE — Telephone Encounter (Signed)
I spoke with Remo Lipps. I am suspicious that she may have had a PE associated with the LE DVT found 11/8. Makes sense for her to stay on the Xarelto - she does not report any bleeding. She is going to have labs drawn next week w Dr Renold Genta, need to r/o evolving anemia. She is not having any desaturations on her current 3L/min.   I'd like for her to be seen early next week. Either by APP or I could come in to see her late morning 11/23. Would only do so if workable, because I do not want to add extra pressure to overstretched office staffing.   I did advise her to seek care in ED if she worsens in any way. Otherwise we'll get her in next week.

## 2020-11-18 NOTE — Telephone Encounter (Signed)
Yes, that is ok

## 2020-11-18 NOTE — Telephone Encounter (Signed)
Spoke with Ander Purpura, the 1130 slot on Tuesday would not be an option. Called RB and spoke with him about this. I was able to get the patient scheduled to see Judson Roch on Monday at 1130am. Patient is aware of this.   Nothing further needed at time of call.

## 2020-11-18 NOTE — Telephone Encounter (Signed)
I checked all of the APP schedules, I did not see any openings. I will check with [name] to see if anyone will be available to room patient.   Dr. Lamonte Sakai, would 1130am on 11/23 be a ok time for you?

## 2020-11-21 ENCOUNTER — Ambulatory Visit (INDEPENDENT_AMBULATORY_CARE_PROVIDER_SITE_OTHER): Payer: Medicare Other | Admitting: Acute Care

## 2020-11-21 ENCOUNTER — Ambulatory Visit (INDEPENDENT_AMBULATORY_CARE_PROVIDER_SITE_OTHER): Payer: Medicare Other

## 2020-11-21 ENCOUNTER — Other Ambulatory Visit: Payer: Self-pay | Admitting: Acute Care

## 2020-11-21 ENCOUNTER — Encounter: Payer: Self-pay | Admitting: Acute Care

## 2020-11-21 ENCOUNTER — Inpatient Hospital Stay (HOSPITAL_COMMUNITY)
Admission: EM | Admit: 2020-11-21 | Discharge: 2020-11-23 | DRG: 368 | Disposition: A | Discharge status: Home / Self Care | Payer: Medicare Other | Attending: Internal Medicine | Admitting: Internal Medicine

## 2020-11-21 ENCOUNTER — Other Ambulatory Visit: Payer: Self-pay

## 2020-11-21 ENCOUNTER — Encounter (HOSPITAL_COMMUNITY): Payer: Self-pay

## 2020-11-21 ENCOUNTER — Telehealth: Payer: Self-pay | Admitting: Acute Care

## 2020-11-21 ENCOUNTER — Inpatient Hospital Stay: Admission: RE | Admit: 2020-11-21 | Payer: Self-pay | Source: Ambulatory Visit

## 2020-11-21 VITALS — BP 122/42 | HR 88 | Temp 98.4°F | Ht 67.5 in | Wt 122.0 lb

## 2020-11-21 DIAGNOSIS — Z7902 Long term (current) use of antithrombotics/antiplatelets: Secondary | ICD-10-CM

## 2020-11-21 DIAGNOSIS — Z880 Allergy status to penicillin: Secondary | ICD-10-CM

## 2020-11-21 DIAGNOSIS — R195 Other fecal abnormalities: Secondary | ICD-10-CM

## 2020-11-21 DIAGNOSIS — Z20822 Contact with and (suspected) exposure to covid-19: Secondary | ICD-10-CM | POA: Diagnosis not present

## 2020-11-21 DIAGNOSIS — N183 Chronic kidney disease, stage 3 unspecified: Secondary | ICD-10-CM

## 2020-11-21 DIAGNOSIS — I4510 Unspecified right bundle-branch block: Secondary | ICD-10-CM | POA: Diagnosis present

## 2020-11-21 DIAGNOSIS — Z8572 Personal history of non-Hodgkin lymphomas: Secondary | ICD-10-CM

## 2020-11-21 DIAGNOSIS — I82431 Acute embolism and thrombosis of right popliteal vein: Secondary | ICD-10-CM | POA: Diagnosis present

## 2020-11-21 DIAGNOSIS — I82499 Acute embolism and thrombosis of other specified deep vein of unspecified lower extremity: Secondary | ICD-10-CM | POA: Diagnosis not present

## 2020-11-21 DIAGNOSIS — I5033 Acute on chronic diastolic (congestive) heart failure: Secondary | ICD-10-CM | POA: Diagnosis not present

## 2020-11-21 DIAGNOSIS — Z9981 Dependence on supplemental oxygen: Secondary | ICD-10-CM

## 2020-11-21 DIAGNOSIS — B3781 Candidal esophagitis: Secondary | ICD-10-CM | POA: Diagnosis present

## 2020-11-21 DIAGNOSIS — D72829 Elevated white blood cell count, unspecified: Secondary | ICD-10-CM | POA: Diagnosis present

## 2020-11-21 DIAGNOSIS — E039 Hypothyroidism, unspecified: Secondary | ICD-10-CM | POA: Diagnosis present

## 2020-11-21 DIAGNOSIS — Z882 Allergy status to sulfonamides status: Secondary | ICD-10-CM | POA: Diagnosis not present

## 2020-11-21 DIAGNOSIS — I130 Hypertensive heart and chronic kidney disease with heart failure and stage 1 through stage 4 chronic kidney disease, or unspecified chronic kidney disease: Secondary | ICD-10-CM | POA: Diagnosis present

## 2020-11-21 DIAGNOSIS — I824Z1 Acute embolism and thrombosis of unspecified deep veins of right distal lower extremity: Secondary | ICD-10-CM | POA: Diagnosis present

## 2020-11-21 DIAGNOSIS — M349 Systemic sclerosis, unspecified: Secondary | ICD-10-CM | POA: Diagnosis present

## 2020-11-21 DIAGNOSIS — Z79890 Hormone replacement therapy (postmenopausal): Secondary | ICD-10-CM

## 2020-11-21 DIAGNOSIS — I10 Essential (primary) hypertension: Secondary | ICD-10-CM | POA: Diagnosis not present

## 2020-11-21 DIAGNOSIS — Z881 Allergy status to other antibiotic agents status: Secondary | ICD-10-CM | POA: Diagnosis not present

## 2020-11-21 DIAGNOSIS — K2211 Ulcer of esophagus with bleeding: Principal | ICD-10-CM | POA: Diagnosis present

## 2020-11-21 DIAGNOSIS — J811 Chronic pulmonary edema: Secondary | ICD-10-CM | POA: Diagnosis not present

## 2020-11-21 DIAGNOSIS — K2091 Esophagitis, unspecified with bleeding: Secondary | ICD-10-CM | POA: Diagnosis not present

## 2020-11-21 DIAGNOSIS — K2950 Unspecified chronic gastritis without bleeding: Secondary | ICD-10-CM | POA: Diagnosis not present

## 2020-11-21 DIAGNOSIS — D500 Iron deficiency anemia secondary to blood loss (chronic): Secondary | ICD-10-CM | POA: Diagnosis not present

## 2020-11-21 DIAGNOSIS — E785 Hyperlipidemia, unspecified: Secondary | ICD-10-CM | POA: Diagnosis present

## 2020-11-21 DIAGNOSIS — E440 Moderate protein-calorie malnutrition: Secondary | ICD-10-CM | POA: Diagnosis not present

## 2020-11-21 DIAGNOSIS — J849 Interstitial pulmonary disease, unspecified: Secondary | ICD-10-CM | POA: Diagnosis present

## 2020-11-21 DIAGNOSIS — Z681 Body mass index (BMI) 19 or less, adult: Secondary | ICD-10-CM

## 2020-11-21 DIAGNOSIS — Z7952 Long term (current) use of systemic steroids: Secondary | ICD-10-CM

## 2020-11-21 DIAGNOSIS — Z807 Family history of other malignant neoplasms of lymphoid, hematopoietic and related tissues: Secondary | ICD-10-CM

## 2020-11-21 DIAGNOSIS — D649 Anemia, unspecified: Secondary | ICD-10-CM

## 2020-11-21 DIAGNOSIS — K921 Melena: Secondary | ICD-10-CM | POA: Diagnosis not present

## 2020-11-21 DIAGNOSIS — Z85820 Personal history of malignant melanoma of skin: Secondary | ICD-10-CM

## 2020-11-21 DIAGNOSIS — K208 Other esophagitis without bleeding: Secondary | ICD-10-CM | POA: Diagnosis not present

## 2020-11-21 DIAGNOSIS — M1990 Unspecified osteoarthritis, unspecified site: Secondary | ICD-10-CM | POA: Diagnosis present

## 2020-11-21 DIAGNOSIS — Z7901 Long term (current) use of anticoagulants: Secondary | ICD-10-CM

## 2020-11-21 DIAGNOSIS — S81802A Unspecified open wound, left lower leg, initial encounter: Secondary | ICD-10-CM | POA: Diagnosis present

## 2020-11-21 DIAGNOSIS — K2970 Gastritis, unspecified, without bleeding: Secondary | ICD-10-CM | POA: Diagnosis present

## 2020-11-21 DIAGNOSIS — E876 Hypokalemia: Secondary | ICD-10-CM | POA: Diagnosis not present

## 2020-11-21 DIAGNOSIS — J9692 Respiratory failure, unspecified with hypercapnia: Secondary | ICD-10-CM | POA: Diagnosis not present

## 2020-11-21 DIAGNOSIS — Z95820 Peripheral vascular angioplasty status with implants and grafts: Secondary | ICD-10-CM | POA: Diagnosis not present

## 2020-11-21 DIAGNOSIS — I2729 Other secondary pulmonary hypertension: Secondary | ICD-10-CM | POA: Diagnosis present

## 2020-11-21 DIAGNOSIS — Z79899 Other long term (current) drug therapy: Secondary | ICD-10-CM

## 2020-11-21 DIAGNOSIS — I7025 Atherosclerosis of native arteries of other extremities with ulceration: Secondary | ICD-10-CM | POA: Diagnosis not present

## 2020-11-21 DIAGNOSIS — L97509 Non-pressure chronic ulcer of other part of unspecified foot with unspecified severity: Secondary | ICD-10-CM | POA: Diagnosis present

## 2020-11-21 DIAGNOSIS — K922 Gastrointestinal hemorrhage, unspecified: Secondary | ICD-10-CM

## 2020-11-21 DIAGNOSIS — M810 Age-related osteoporosis without current pathological fracture: Secondary | ICD-10-CM | POA: Diagnosis present

## 2020-11-21 DIAGNOSIS — I2721 Secondary pulmonary arterial hypertension: Secondary | ICD-10-CM

## 2020-11-21 DIAGNOSIS — Z83438 Family history of other disorder of lipoprotein metabolism and other lipidemia: Secondary | ICD-10-CM

## 2020-11-21 DIAGNOSIS — R0602 Shortness of breath: Secondary | ICD-10-CM

## 2020-11-21 DIAGNOSIS — I739 Peripheral vascular disease, unspecified: Secondary | ICD-10-CM | POA: Diagnosis present

## 2020-11-21 DIAGNOSIS — Z86718 Personal history of other venous thrombosis and embolism: Secondary | ICD-10-CM | POA: Diagnosis not present

## 2020-11-21 DIAGNOSIS — N1832 Chronic kidney disease, stage 3b: Secondary | ICD-10-CM | POA: Diagnosis present

## 2020-11-21 DIAGNOSIS — Z832 Family history of diseases of the blood and blood-forming organs and certain disorders involving the immune mechanism: Secondary | ICD-10-CM

## 2020-11-21 DIAGNOSIS — I82409 Acute embolism and thrombosis of unspecified deep veins of unspecified lower extremity: Secondary | ICD-10-CM | POA: Diagnosis not present

## 2020-11-21 DIAGNOSIS — K2100 Gastro-esophageal reflux disease with esophagitis, without bleeding: Secondary | ICD-10-CM | POA: Diagnosis not present

## 2020-11-21 DIAGNOSIS — X58XXXA Exposure to other specified factors, initial encounter: Secondary | ICD-10-CM | POA: Diagnosis present

## 2020-11-21 DIAGNOSIS — F32A Depression, unspecified: Secondary | ICD-10-CM | POA: Diagnosis present

## 2020-11-21 DIAGNOSIS — S81801A Unspecified open wound, right lower leg, initial encounter: Secondary | ICD-10-CM | POA: Diagnosis present

## 2020-11-21 DIAGNOSIS — N1831 Chronic kidney disease, stage 3a: Secondary | ICD-10-CM | POA: Diagnosis not present

## 2020-11-21 DIAGNOSIS — I779 Disorder of arteries and arterioles, unspecified: Secondary | ICD-10-CM | POA: Diagnosis present

## 2020-11-21 DIAGNOSIS — Z89021 Acquired absence of right finger(s): Secondary | ICD-10-CM

## 2020-11-21 DIAGNOSIS — R32 Unspecified urinary incontinence: Secondary | ICD-10-CM | POA: Diagnosis present

## 2020-11-21 DIAGNOSIS — Z800 Family history of malignant neoplasm of digestive organs: Secondary | ICD-10-CM

## 2020-11-21 DIAGNOSIS — D631 Anemia in chronic kidney disease: Secondary | ICD-10-CM | POA: Diagnosis not present

## 2020-11-21 LAB — BASIC METABOLIC PANEL
BUN: 52 mg/dL — ABNORMAL HIGH (ref 6–23)
CO2: 26 mEq/L (ref 19–32)
Calcium: 9 mg/dL (ref 8.4–10.5)
Chloride: 94 mEq/L — ABNORMAL LOW (ref 96–112)
Creatinine, Ser: 1.57 mg/dL — ABNORMAL HIGH (ref 0.40–1.20)
GFR: 33.07 mL/min — ABNORMAL LOW (ref >60.00)
Glucose, Bld: 115 mg/dL — ABNORMAL HIGH (ref 70–99)
Potassium: 4.1 mEq/L (ref 3.5–5.1)
Sodium: 134 mEq/L — ABNORMAL LOW (ref 135–145)

## 2020-11-21 LAB — PREPARE RBC (CROSSMATCH)

## 2020-11-21 LAB — CBC WITH DIFFERENTIAL/PLATELET
Basophils Absolute: 0 10*3/uL (ref 0.0–0.1)
Basophils Relative: 0.2 % (ref 0.0–3.0)
Eosinophils Absolute: 0 10*3/uL (ref 0.0–0.7)
Eosinophils Relative: 0 % (ref 0.0–5.0)
HCT: 17.5 % — CL (ref 36.0–46.0)
Hemoglobin: 5.7 g/dL — CL (ref 12.0–15.0)
Lymphocytes Relative: 5.5 % — ABNORMAL LOW (ref 12.0–46.0)
Lymphs Abs: 0.6 10*3/uL — ABNORMAL LOW (ref 0.7–4.0)
MCHC: 32.7 g/dL (ref 30.0–36.0)
MCV: 105.9 fl — ABNORMAL HIGH (ref 78.0–100.0)
Monocytes Absolute: 0.7 10*3/uL (ref 0.1–1.0)
Monocytes Relative: 6.2 % (ref 3.0–12.0)
Neutro Abs: 9.3 10*3/uL — ABNORMAL HIGH (ref 1.4–7.7)
Neutrophils Relative %: 88.1 % — ABNORMAL HIGH (ref 43.0–77.0)
Platelets: 386 10*3/uL (ref 150.0–400.0)
RBC: 1.65 Mil/uL — ABNORMAL LOW (ref 3.87–5.11)
RDW: 15.1 % (ref 11.5–15.5)
WBC: 10.6 10*3/uL — ABNORMAL HIGH (ref 4.0–10.5)

## 2020-11-21 LAB — RETICULOCYTES
Immature Retic Fract: 40 % — ABNORMAL HIGH (ref 2.3–15.9)
RBC.: 1.56 MIL/uL — ABNORMAL LOW (ref 3.87–5.11)
Retic Count, Absolute: 164 10*3/uL (ref 19.0–186.0)
Retic Ct Pct: 10.5 % — ABNORMAL HIGH (ref 0.4–3.1)

## 2020-11-21 LAB — CBC
HCT: 17.3 % — ABNORMAL LOW (ref 36.0–46.0)
Hemoglobin: 5.3 g/dL — CL (ref 12.0–15.0)
MCH: 34.6 pg — ABNORMAL HIGH (ref 26.0–34.0)
MCHC: 30.6 g/dL (ref 30.0–36.0)
MCV: 113.1 fL — ABNORMAL HIGH (ref 80.0–100.0)
Platelets: 367 10*3/uL (ref 150–400)
RBC: 1.53 MIL/uL — ABNORMAL LOW (ref 3.87–5.11)
RDW: 18 % — ABNORMAL HIGH (ref 11.5–15.5)
WBC: 9.6 10*3/uL (ref 4.0–10.5)
nRBC: 0.8 % — ABNORMAL HIGH (ref 0.0–0.2)

## 2020-11-21 LAB — MRSA PCR SCREENING: MRSA by PCR: POSITIVE — AB

## 2020-11-21 LAB — RESP PANEL BY RT-PCR (FLU A&B, COVID) ARPGX2
Influenza A by PCR: NEGATIVE
Influenza B by PCR: NEGATIVE
SARS Coronavirus 2 by RT PCR: NEGATIVE

## 2020-11-21 LAB — IRON AND TIBC
Iron: 39 ug/dL (ref 28–170)
Saturation Ratios: 10 % — ABNORMAL LOW (ref 10.4–31.8)
TIBC: 376 ug/dL (ref 250–450)
UIBC: 337 ug/dL

## 2020-11-21 LAB — VITAMIN B12: Vitamin B-12: 742 pg/mL (ref 180–914)

## 2020-11-21 LAB — BRAIN NATRIURETIC PEPTIDE: Pro B Natriuretic peptide (BNP): 526 pg/mL — ABNORMAL HIGH (ref 0.0–100.0)

## 2020-11-21 LAB — PROTIME-INR
INR: 1.9 — ABNORMAL HIGH (ref 0.8–1.2)
Prothrombin Time: 21.4 seconds — ABNORMAL HIGH (ref 11.4–15.2)

## 2020-11-21 LAB — POC OCCULT BLOOD, ED: Fecal Occult Bld: POSITIVE — AB

## 2020-11-21 LAB — FERRITIN: Ferritin: 195 ng/mL (ref 11–307)

## 2020-11-21 LAB — FOLATE: Folate: 106.8 ng/mL (ref >5.9)

## 2020-11-21 MED ORDER — SODIUM CHLORIDE 0.9 % IV SOLN
10.0000 mL/h | Freq: Once | INTRAVENOUS | Status: AC
  Administered 2020-11-21: 10 mL/h via INTRAVENOUS

## 2020-11-21 MED ORDER — PANTOPRAZOLE SODIUM 40 MG IV SOLR
40.0000 mg | Freq: Two times a day (BID) | INTRAVENOUS | Status: DC
  Administered 2020-11-22 – 2020-11-23 (×3): 40 mg via INTRAVENOUS
  Filled 2020-11-21 (×5): qty 40

## 2020-11-21 MED ORDER — VITAMIN B-12 1000 MCG PO TABS
1000.0000 ug | ORAL_TABLET | Freq: Every day | ORAL | Status: DC
  Administered 2020-11-22 – 2020-11-23 (×2): 1000 ug via ORAL
  Filled 2020-11-21 (×2): qty 1

## 2020-11-21 MED ORDER — FUROSEMIDE 10 MG/ML IJ SOLN
80.0000 mg | Freq: Two times a day (BID) | INTRAMUSCULAR | Status: DC
  Administered 2020-11-21 – 2020-11-23 (×4): 80 mg via INTRAVENOUS
  Filled 2020-11-21 (×4): qty 8

## 2020-11-21 MED ORDER — LEVOTHYROXINE SODIUM 50 MCG PO TABS
50.0000 ug | ORAL_TABLET | Freq: Every day | ORAL | Status: DC
  Administered 2020-11-22 – 2020-11-23 (×2): 50 ug via ORAL
  Filled 2020-11-21 (×2): qty 1

## 2020-11-21 MED ORDER — COLLAGENASE 250 UNIT/GM EX OINT
TOPICAL_OINTMENT | Freq: Every day | CUTANEOUS | Status: DC
  Filled 2020-11-21: qty 30

## 2020-11-21 MED ORDER — FOLIC ACID 1 MG PO TABS
1.0000 mg | ORAL_TABLET | Freq: Every day | ORAL | Status: DC
  Administered 2020-11-22 – 2020-11-23 (×2): 1 mg via ORAL
  Filled 2020-11-21 (×2): qty 1

## 2020-11-21 MED ORDER — PREDNISONE 5 MG PO TABS
5.0000 mg | ORAL_TABLET | Freq: Every day | ORAL | Status: DC
  Administered 2020-11-22: 5 mg via ORAL
  Filled 2020-11-21: qty 1

## 2020-11-21 MED ORDER — ACETAMINOPHEN 500 MG PO TABS
1000.0000 mg | ORAL_TABLET | Freq: Four times a day (QID) | ORAL | Status: DC | PRN
  Administered 2020-11-21 – 2020-11-23 (×5): 1000 mg via ORAL
  Filled 2020-11-21 (×5): qty 2

## 2020-11-21 MED ORDER — BENZONATATE 100 MG PO CAPS: 200.0000 mg | ORAL_CAPSULE | Freq: Three times a day (TID) | ORAL | Status: DC | PRN

## 2020-11-21 MED ORDER — CYCLOSPORINE 0.05 % OP EMUL
1.0000 [drp] | Freq: Two times a day (BID) | OPHTHALMIC | Status: DC
  Administered 2020-11-21 – 2020-11-23 (×4): 1 [drp] via OPHTHALMIC
  Filled 2020-11-21 (×5): qty 1

## 2020-11-21 MED ORDER — PANTOPRAZOLE SODIUM 40 MG IV SOLR
40.0000 mg | Freq: Once | INTRAVENOUS | Status: AC
  Administered 2020-11-21: 40 mg via INTRAVENOUS
  Filled 2020-11-21: qty 40

## 2020-11-21 MED ORDER — SPIRONOLACTONE 25 MG PO TABS
25.0000 mg | ORAL_TABLET | Freq: Every day | ORAL | Status: DC
  Administered 2020-11-22 – 2020-11-23 (×2): 25 mg via ORAL
  Filled 2020-11-21 (×2): qty 1

## 2020-11-21 MED ORDER — CITALOPRAM HYDROBROMIDE 20 MG PO TABS
20.0000 mg | ORAL_TABLET | Freq: Every day | ORAL | Status: DC
  Administered 2020-11-21 – 2020-11-22 (×2): 20 mg via ORAL
  Filled 2020-11-21 (×2): qty 2

## 2020-11-21 MED ORDER — CHLORHEXIDINE GLUCONATE CLOTH 2 % EX PADS
6.0000 | MEDICATED_PAD | Freq: Every day | CUTANEOUS | Status: DC
  Administered 2020-11-21 – 2020-11-22 (×2): 6 via TOPICAL

## 2020-11-21 MED ORDER — SILDENAFIL CITRATE 20 MG PO TABS
60.0000 mg | ORAL_TABLET | Freq: Three times a day (TID) | ORAL | Status: DC
  Administered 2020-11-21 – 2020-11-23 (×6): 60 mg via ORAL
  Filled 2020-11-21 (×7): qty 3

## 2020-11-21 MED ORDER — MUPIROCIN 2 % EX OINT
1.0000 "application " | TOPICAL_OINTMENT | Freq: Two times a day (BID) | CUTANEOUS | Status: DC
  Administered 2020-11-21 – 2020-11-23 (×4): 1 via NASAL
  Filled 2020-11-21 (×2): qty 22

## 2020-11-21 NOTE — ED Provider Notes (Signed)
[facility] DEPT Provider Note   CSN: [id number] Arrival date & time: 11/21/20  1519     History Chief Complaint  Patient presents with  . Abnormal Lab    [name] [name] is a 69 y.o. female.  Patient c/o being sent to ED by her doctor after labwork today showed hgb very low. Patient indicates generally felt weak, lightheaded when stands, fatigued, for the past couple weeks. Symptoms gradual onset, moderate, constant, persistent, worse w standing. Denies acute blood loss, melena or rectal bleeding. Notes hx anemia and prior transfusion, but denies recent. No chest pain or sob. No abd pain or nvd. Is on xarelto.   The history is provided by the patient.  Abnormal Lab      Past Medical History:  Diagnosis Date  . Anemia   . Arthritis   . DOE (dyspnea on exertion)   . Epistaxis   . History of chicken pox   . Hypertension   . Hypothyroidism   . Leg ulcer ([place])   . Melanoma ([place]) 1999  . 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[place])   . Non Hodgkin's lymphoma ([place])    Treated with chemo 2010, felt to be cured.  Marland Kitchen PAH (pulmonary artery hypertension) ([place])   . Peripheral arterial occlusive disease ([place])   . Pulmonary hypertension ([place])   . Requires supplemental oxygen    2 liters-3liters when moving  . 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 Wrist fracture 11/24/2019  . Multiple closed stable fractures of pubic ramus ([place]) 11/23/2019  . Distal radius fracture, left 11/23/2019  . Fall at home, initial encounter 11/23/2019  . AMS (altered mental status) 11/12/2019  . Altered mental status 11/11/2019  . Hypomagnesemia 11/11/2019  . Osteoporosis 01/22/2019  . Hypothyroidism 06/16/2018  . Pulmonary hypertension associated with systemic disorder ([place]) 10/08/2017  . Dry gangrene ([place]) 09/11/2017  . Medication reaction 09/11/2017  . ILD (interstitial lung disease) ([place]) 07/22/2017  . Chronic respiratory  failure ([place]) 10/31/2016  . Atherosclerosis of native arteries of right leg with ulceration of unspecified site ([place]) 09/14/2016  . Prolonged Q-T interval on ECG 07/29/2016  . CKD (chronic kidney disease), stage III ([place]) 07/29/2016  . Scleroderma ([place]) 05/06/2016  . Depression 04/24/2016  . Hypokalemia 10/07/2015  . 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 Allergic rhinitis 03/08/2015  . Varicose veins of lower extremities with ulcer and inflammation ([place]) 07/24/2012  . 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[place]) 04/08/2012  . Pulmonary hypertension associated with systemic disorder ([place]) 03/25/2012  . Peripheral arterial occlusive disease ([place]) 08/10/2011  . Essential hypertension 08/10/2011  . Hyperlipidemia 08/10/2011    Past Surgical History:  Procedure Laterality Date  . ABDOMINAL AORTOGRAM W/LOWER EXTREMITY Bilateral 10/04/2020   Procedure: ABDOMINAL AORTOGRAM W/LOWER EXTREMITY;  Surgeon: Serafina Mitchell, MD;  Location: [facility];  Service: Cardiovascular;  Laterality: Bilateral;  . AMPUTATION Right 10/24/2017   Procedure: RAY resection right index;  Surgeon: Daryll Brod, MD;  Location: [facility];  Service: Orthopedics;  Laterality: Right;  Axillary block  . AMPUTATION Right 11/21/2018   Procedure: AMPUTATION RAY RIGHT MIDDLE FINGER;  Surgeon: Daryll Brod, MD;  Location: [facility];  Service: Orthopedics;  Laterality: Right;  . AMPUTATION Right 10/22/2019   Procedure: AMPUTATION DIGIT RIGHT RING AND SMALL FINGER, DIGITAL SYMPATHECTOMY RIGHT RADIAL ULNAR ARCH;  Surgeon: Daryll Brod, MD;  Location: [facility];  Service: Orthopedics;  Laterality: Right;  AXILARY  . apligraph  12-04-2102,   12-18-2012   bilateral calves   [name]  . biopsy on cerebellum    . COLONOSCOPY WITH PROPOFOL Left 10/07/2015   Procedure: COLONOSCOPY WITH PROPOFOL;  Surgeon: [name], MD;  Location: [facility];  Service: Endoscopy;  Laterality:  Left;  . ENDARTERECTOMY FEMORAL Right 09/14/2016   Procedure: ENDARTERECTOMY FEMORAL RIGHT;  Surgeon: [name], MD;  Location: [facility] OR;  Service: Vascular;  Laterality: Right;  . ENDOVENOUS ABLATION SAPHENOUS VEIN W/ LASER  07-24-2012   right greater saphenous vein by [name], M.D.   . ENDOVENOUS ABLATION SAPHENOUS VEIN W/ LASER  10-16-2012   left greater saphenous vein  by Dr. [name]  . ENDOVENOUS ABLATION SAPHENOUS VEIN W/ LASER Right 09-01-2015   endovenous laser ablation right greater saphenous vein by [name] MD  . ESOPHAGOGASTRODUODENOSCOPY N/A 05/07/2016   Procedure: ESOPHAGOGASTRODUODENOSCOPY (EGD);  Surgeon: [name], MD;  Location: [facility] ENDOSCOPY;  Service: Endoscopy;  Laterality: N/A;  . ESOPHAGOGASTRODUODENOSCOPY (EGD) WITH PROPOFOL Left 10/07/2015   Procedure: ESOPHAGOGASTRODUODENOSCOPY (EGD) WITH PROPOFOL;  Surgeon: [name], MD;  Location: [facility];  Service: Endoscopy;  Laterality: Left;  . GIVENS CAPSULE STUDY N/A 07/31/2016   Procedure: GIVENS CAPSULE STUDY;  Surgeon: [name], MD;  Location: [facility] ENDOSCOPY;  Service: Endoscopy;  Laterality: N/A;  . LOWER EXTREMITY ANGIOGRAPHY N/A 10/25/2020   Procedure: LOWER EXTREMITY ANGIOGRAPHY - Left;  Surgeon: Serafina Mitchell, MD;  Location: [facility];  Service: Cardiovascular;  Laterality: N/A;  . melonoma removes Right    behind right knee  . OPEN REDUCTION INTERNAL FIXATION (ORIF) DISTAL RADIAL FRACTURE Left 11/24/2019   Procedure: OPEN REDUCTION INTERNAL FIXATION (ORIF) DISTAL RADIAL FRACTURE;  Surgeon: Altamese Gallaway, MD;  Location: [facility];  Service: Orthopedics;  Laterality: Left;  . PATCH ANGIOPLASTY Right 09/14/2016   Procedure: PATCH ANGIOPLASTY RIGHT FEMORAL ARTERY USING HEMASHIELD PLATINUM FINESSE PATCH;  Surgeon: [name], MD;  Location: [facility];  Service: Vascular;  Laterality: Right;  . PERIPHERAL VASCULAR BALLOON ANGIOPLASTY Right 10/04/2020   Procedure: PERIPHERAL VASCULAR BALLOON ANGIOPLASTY;   Surgeon: Serafina Mitchell, MD;  Location: [facility];  Service: Cardiovascular;  Laterality: Right;  SFA/PT/AT/PTtrunk  . PERIPHERAL VASCULAR BALLOON ANGIOPLASTY Left 10/25/2020   Procedure: PERIPHERAL VASCULAR BALLOON ANGIOPLASTY;  Surgeon: Serafina Mitchell, MD;  Location: [facility];  Service: Cardiovascular;  Laterality: Left;  anterior tibial and tibioperoneal trunk  . PERIPHERAL VASCULAR CATHETERIZATION N/A 09/07/2016   Procedure: Abdominal Aortogram w/Lower Extremity;  Surgeon: [name], MD;  Location: [facility];  Service: Cardiovascular;  Laterality: N/A;  . RIGHT HEART CATH N/A 04/19/2017   Procedure: Right Heart Cath;  Surgeon: [name], MD;  Location: [facility];  Service: Cardiovascular;  Laterality: N/A;  . RIGHT HEART CATHETERIZATION N/A 11/14/2012   Procedure: RIGHT HEART CATH;  Surgeon: [name], MD;  Location: [facility] CATH LAB;  Service: Cardiovascular;  Laterality: N/A;  . rt little toe removal  10/1998  . stent left thigh  3/11  . TENDON TRANSFER Right 10/24/2017   Procedure: transfer extensor indecus propius/extensor digitorum commomus index to middle ring and small;  Surgeon: Daryll Brod, MD;  Location: [facility];  Service: Orthopedics;  Laterality: Right;  . TONSILLECTOMY    . TONSILLECTOMY AND ADENOIDECTOMY    . TUNNELED VENOUS CATHETER PLACEMENT  01/2013  . ULNAR HEAD EXCISION Right 10/24/2017   Procedure:  darrach right ulna;  Surgeon: Daryll Brod, MD;  Location: [facility];  Service: Orthopedics;  Laterality: Right;  . VEIN LIGATION AND STRIPPING  05/16/2012   Procedure: VEIN LIGATION AND STRIPPING;  Surgeon: [name], MD;  Location: [facility] OR;  Service: Vascular;  Laterality: Right;  Irrigation and Debridement right lower leg, ligation of saphenous vein.     OB History   No obstetric history on file.     Family History  Problem Relation Age of Onset  . Lupus Father   . Lymphoma Father   . Hyperlipidemia Mother   .  Cancer Daughter        sarcoma  . Colon cancer Other     Social History   Tobacco Use  . Smoking status: Never Smoker  . Smokeless tobacco: Never Used  Vaping Use  . Vaping Use: Never used  Substance Use Topics  . Alcohol use: Yes    Alcohol/week: 0.0 standard drinks    Comment: 1-2 drinks per day wine.liquor or beer  . Drug use: No    Home Medications Prior to Admission medications   Medication Sig Start Date End Date Taking? Authorizing Provider  acetaminophen (TYLENOL) 650 MG CR tablet Take 1,300 mg by mouth in the morning and at bedtime.    [provider]  Biotin 5000 MCG TABS Take 5,000 mcg by mouth daily.    [provider]  CALCIUM-MAGNESIUM-VITAMIN D PO Take 2 tablets by mouth daily.    [provider]  Cholecalciferol (VITAMIN D) 2000 units CAPS Take 2,000 Units by mouth daily.    [provider]  citalopram (CELEXA) 20 MG tablet TAKE 1 TABLET BY MOUTH EVERYDAY AT BEDTIME Patient taking differently: Take 20 mg by mouth at bedtime.  06/30/20   [name], MD  clopidogrel (PLAVIX) 75 MG tablet TAKE 1 TABLET BY MOUTH EVERY DAY Patient taking differently: Take 75 mg by mouth daily.  03/07/20   Larey Dresser, MD  doxycycline (MONODOX) 100 MG capsule Take 100 mg by mouth 2 (two) times daily. 11/17/20   [provider]  FERREX 150 150 MG capsule TAKE 1 CAPSULE BY MOUTH TWICE A DAY Patient taking differently: Take 150 mg by mouth 2 (two) times daily.  08/08/20   [name], MD  fish oil-omega-3 fatty acids 1000 MG capsule Take 1,000 mg by mouth 2 (two) times daily.     [provider]  folic acid (FOLVITE) 1 MG tablet TAKE 1 TABLET BY MOUTH EVERY DAY Patient taking differently: Take 1 mg by mouth daily.  09/15/20   [name], MD  levothyroxine (SYNTHROID) 50 MCG tablet TAKE 1 TABLET BY MOUTH DAILY BEFORE BREAKFAST Patient taking differently: Take 50 mcg by mouth daily before breakfast.  08/31/20   Baxley, Cresenciano Lick, MD    loperamide (IMODIUM) 2 MG capsule Take 2 mg by mouth daily as needed for diarrhea or loose stools.     [provider]  magnesium oxide (MAG-OX) 400 (241.3 Mg) MG tablet Take 0.5 tablets (200 mg total) by mouth 2 (two) times daily. 11/15/19   Benito Mccreedy, MD  Multiple Vitamin (MULTIVITAMIN WITH MINERALS) TABS tablet Take 1 tablet by mouth daily.    [provider]  OPSUMIT 10 MG tablet Take 1 tablet (41m) by mouth daily. Patient taking differently: Take 10 mg by mouth daily.  04/29/20   Bensimhon, DShaune Pascal MD  OXYGEN Inhale 2-3 L into the lungs continuous.     [provider]  pantoprazole (PROTONIX) 40 MG tablet Take 1 tablet (40 mg total) by mouth 2 (two) times daily. 10/05/20   [name], MD  potassium chloride 20 MEQ/15ML (10%) SOLN TAKE 2 TEASPOONFUL (DILUTED IN WATER) ONCE DAILY Patient taking differently: Take 13 mEq by mouth daily. 10 ml 08/02/20   [name], MD  predniSONE (DELTASONE) 5 MG tablet Take 5 mg by mouth daily. 04/04/20   [provider]  Probiotic CAPS Take 1 capsule by mouth daily.    [provider]  RESTASIS 0.05 % ophthalmic emulsion Place 1 drop into both eyes 2 (two) times daily. 08/28/19   [provider]  RIVAROXABAN Alveda Reasons) VTE STARTER PACK (15 & 20 MG TABLETS) Follow package directions: Take one 35m tablet by mouth twice a day. On day 22, switch to one 285mtablet once a day. Take with food. 11/07/20   BrSerafina MitchellMD  rosuvastatin (CRESTOR) 5 MG tablet Take 1 tablet every Monday, Wednesday, and Friday. Patient taking differently: Take 5 mg by mouth every Monday, Wednesday, and Friday.  08/29/20   Bensimhon, DaShaune PascalMD  SANTYL ointment Apply 1 application topically daily. 11/16/20   [provider]  sildenafil (REVATIO) 20 MG tablet TAKE 3 TABLETS (60 MG TOTAL) BY MOUTH 3 (THREE) TIMES DAILY. PLEASE CALL FOR OFFICE VISIT [phone]/25/21   Bensimhon, DaShaune PascalMD  spironolactone  (ALDACTONE) 25 MG tablet TAKE 1 TABLET BY MOUTH EVERY DAY 10/24/20   Bensimhon, DaShaune PascalMD  torsemide (DEMADEX) 20 MG tablet Take 40 mg by mouth 2 (two) times daily.    [provider]  valACYclovir (VALTREX) 500 MG tablet Take 1 tablet (500 mg total) by mouth 2 (two) times daily. Patient taking differently: Take 500 mg by mouth daily as needed (lip sore).  09/02/19   BaElby ShowersMD  vitamin B-12 (CYANOCOBALAMIN) 1000 MCG tablet Take 1,000 mcg by mouth daily.    [provider]    Allergies    Levofloxacin, Sulfa antibiotics, Doxycycline hyclate, and Penicillins  Review of Systems   Review of Systems  Constitutional: Negative for chills and fever.  HENT: Negative for sore throat.   Eyes: Negative for redness.  Respiratory: Negative for cough and shortness of breath.   Cardiovascular: Negative for chest pain.  Gastrointestinal: Negative for abdominal pain, blood in stool and vomiting.  Genitourinary: Negative for flank pain.  Musculoskeletal: Negative for back pain and neck pain.  Skin: Negative for rash.  Neurological: Negative for headaches.  Hematological:       Takes xarelto.   Psychiatric/Behavioral: Negative for confusion.    Physical Exam Updated Vital Signs BP 132/60 (BP Location: Left Arm)   Pulse 70   Temp 98.5 F (36.9 C)   Resp 20   SpO2 94%   Physical Exam Vitals and nursing note reviewed.  Constitutional:      Appearance: Normal appearance. She is well-developed.  HENT:     Head: Atraumatic.     Nose: Nose normal.     Mouth/Throat:     Mouth: Mucous membranes are moist.  Eyes:     General: No scleral icterus.    Comments: Conj pale.   Neck:     Trachea: No tracheal deviation.  Cardiovascular:     Rate and Rhythm: Normal rate and regular rhythm.     Pulses: Normal pulses.     Heart sounds: Normal heart sounds. No murmur heard.  No friction rub. No gallop.  Pulmonary:     Effort: Pulmonary effort is normal. No respiratory  distress.     Breath sounds: Normal breath sounds.  Abdominal:     General: Bowel sounds are normal. There is no distension.     Palpations: Abdomen is soft.     Tenderness: There is no abdominal tenderness. There is no guarding.  Genitourinary:    Comments: No cva tenderness. Dark brown stool, sent for hemoccult.  Musculoskeletal:        General: No swelling.     Cervical back: Normal range of motion and neck supple. No rigidity. No muscular tenderness.  Skin:    General: Skin is warm and dry.     Findings: No rash.  Neurological:     Mental Status: She is alert.     Comments: Alert, speech normal.   Psychiatric:        Mood and Affect: Mood normal.      ED Results / Procedures / Treatments   Labs (all labs ordered are listed, but only abnormal results are displayed) Results for orders placed or performed during the hospital encounter of 11/21/20  POC occult blood, ED Provider will collect  Result Value Ref Range   Fecal Occult Bld POSITIVE (A) NEGATIVE   *Note: Due to a large number of results and/or encounters for the requested time period, some results have not been displayed. A complete set of results can be found in Results Review.   DG Chest 2 View  Result Date: 11/21/2020 CLINICAL DATA:  Shortness of breath EXAM: CHEST - 2 VIEW COMPARISON:  May 03, 2020 FINDINGS: Diffuse interstitial thickening throughout the lungs is again noted. There is no consolidation. There is cardiomegaly with pulmonary venous hypertension. No adenopathy. There is aortic atherosclerosis. There is midthoracic dextroscoliosis with upper lumbar levoscoliosis. IMPRESSION: There is cardiac prominence with pulmonary vascular congestion superimposed on chronic interstitial lung disease. It is difficult to exclude mild superimposed interstitial edema. No consolidation or pleural effusions. No adenopathy. Aortic Atherosclerosis (ICD10-I70.0). Electronically Signed   By: Lowella Grip III M.D.   On:  11/21/2020 13:07   PERIPHERAL VASCULAR CATHETERIZATION  Result Date: 10/25/2020 Patient name: ALENE BERGERSON MRN: [id number] DOB: 19-Apr-1951 Sex: female 10/25/2020 Pre-operative Diagnosis: Bilateral lower extremity ulcers Post-operative diagnosis:  Same Surgeon:  Annamarie Major Procedure Performed:  1.  Ultrasound-guided access, right femoral artery  2.  Angioplasty, left anterior tibial artery  3.  Angioplasty, left tibioperoneal trunk  4.  Conscious sedation, 46 minutes Indications: The patient has previously undergone percutaneous intervention of the right leg.  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  She is back today for intervention. Procedure:  The patient was identified in the holding area and taken to room 8.  The patient was then placed supine on the table and prepped and draped in the usual sterile fashion.  A time out was called.  Conscious sedation was administered with the use of IV fentanyl and Versed under continuous physician and nurse monitoring.  Heart rate, blood pressure, and oxygen saturation were continuously monitored.  Total sedation time was 47 minutes.  Ultrasound was used to evaluate the right common femoral artery.  It was patent .  A digital ultrasound image was acquired.  A micropuncture needle was used to access the right common femoral artery under ultrasound guidance.  An 018 wire was advanced without resistance and a micropuncture sheath was placed.  The 018 wire was  removed and a benson wire was placed.  The micropuncture sheath was exchanged for a 5 french sheath.  The aortic bifurcation was crossed and a Omni Flush cath was advanced into the left external iliac artery and left leg runoff was performed Findings:  Left Lower Extremity: The left common femoral profundofemoral artery are widely patent.  The superficial femoral artery is patent throughout its course.  There is diffuse calcification with mild luminal  narrowing.  The popliteal artery is patent throughout its course.  The dominant runoff vessel is anterior tibial artery.  Previous images in a different angle suggested 60 to 70% proximal anterior tibial artery stenosis.  There was also a greater than 70% stenosis at the origin of the tibioperoneal trunk.  The dominant vessel across the ankle is anterior tibial artery Intervention: At this point the decision was made to intervene.  A Rosen wire was inserted followed by placement of 5 French 45 cm Ansell 1 sheath.  The patient was fully heparinized.  I then used a V-18 wire with the support of a Sterling 3 x 40 balloon.  The anterior tibial artery was selected.  Balloon angioplasty was performed at the proximal portion of the anterior tibial artery at a atmospheres for 2 minutes.  Follow-up imaging revealed resolution of the stenosis however there did appear to be a more distal lesion at this time.  A second balloon inflation was performed and this resolved after 2 minutes.  Next attention was turned towards the tibioperoneal trunk lesion.  The peroneal artery was selected with the wire.  The 3 x 40 balloon was then inserted and balloon angioplasty was performed of the tibioperoneal trunk and the proximal peroneal artery.  Unfortunately the balloon ruptured following insufflation.  I removed the balloon and then performed angiography which showed resolution of the tibioperoneal trunk lesion despite suboptimal timing of angioplasty.  I elected to leave this alone.  Catheters and wires were removed.  The patient taken the holding of her sheath pull with her coagulation profile correct. 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  Both lesions were successfully treated with a 3 mm balloon. Theotis Burrow, M.D., [facility] [place] Office: [phone] Pager:  [phone]  VAS [place] LOWER EXTREMITY VENOUS  (DVT)  Result Date: 11/07/2020  Lower Venous DVT Study Indications: Pain, Swelling, and ulceration.  Limitations: Poor ultrasound/tissue interface.  Comparison Study: 12/16/2017: Rt- No evidence of deep vein thrombosis Performing Technologist: Ivan Croft  Examination Guidelines: A complete evaluation includes B-mode imaging, spectral Doppler, color Doppler, and power Doppler as needed of all accessible portions of each vessel. Bilateral testing is considered an integral part of a complete examination. Limited examinations for reoccurring indications may be performed as noted. The reflux portion of the exam is performed with the patient in reverse Trendelenburg.  +---------+---------------+---------+-----------+----------+--------------+ RIGHT    CompressibilityPhasicitySpontaneityPropertiesThrombus Aging +---------+---------------+---------+-----------+----------+--------------+ CFV      Full           Yes      Yes                                 +---------+---------------+---------+-----------+----------+--------------+ SFJ      Full                                                        +---------+---------------+---------+-----------+----------+--------------+  FV Prox  Full                                                        +---------+---------------+---------+-----------+----------+--------------+ FV Mid   Full           Yes      Yes                                 +---------+---------------+---------+-----------+----------+--------------+ FV DistalPartial                                      Acute          +---------+---------------+---------+-----------+----------+--------------+ PFV      Full                                                        +---------+---------------+---------+-----------+----------+--------------+ POP      Partial        Yes      Yes                  Acute           +---------+---------------+---------+-----------+----------+--------------+ PTV      Full                                                        +---------+---------------+---------+-----------+----------+--------------+ PERO     Full                                                        +---------+---------------+---------+-----------+----------+--------------+ Gastroc  Full                                                        +---------+---------------+---------+-----------+----------+--------------+   +----+---------------+---------+-----------+----------+--------------+ LEFTCompressibilityPhasicitySpontaneityPropertiesThrombus Aging +----+---------------+---------+-----------+----------+--------------+ CFV Full           Yes      Yes                                 +----+---------------+---------+-----------+----------+--------------+     Summary:  RIGHT: - Findings consistent with acute deep vein thrombosis involving the right distal femoral vein, and right popliteal vein. - No cystic structure found in the popliteal fossa.  - Results were called to Dr. Dellia Nims and given to Dr. Trula Slade per patient request (11/07/2020).  LEFT: - No evidence of common femoral vein obstruction.  *See table(s) above for measurements and observations. Electronically signed by Harold Barban MD on 11/07/2020 at 4:10:18 PM.  Final     EKG None  Radiology DG Chest 2 View  Result Date: 11/21/2020 CLINICAL DATA:  Shortness of breath EXAM: CHEST - 2 VIEW COMPARISON:  May 03, 2020 FINDINGS: Diffuse interstitial thickening throughout the lungs is again noted. There is no consolidation. There is cardiomegaly with pulmonary venous hypertension. No adenopathy. There is aortic atherosclerosis. There is midthoracic dextroscoliosis with upper lumbar levoscoliosis. IMPRESSION: There is cardiac prominence with pulmonary vascular congestion superimposed on chronic interstitial lung disease. It is  difficult to exclude mild superimposed interstitial edema. No consolidation or pleural effusions. No adenopathy. Aortic Atherosclerosis (ICD10-I70.0). Electronically Signed   By: Lowella Grip III M.D.   On: 11/21/2020 13:07    Procedures Procedures (including critical care time)  Medications Ordered in ED Medications  0.9 %  sodium chloride infusion (has no administration in time range)    ED Course  I have reviewed the triage vital signs and the nursing notes.  Pertinent labs & imaging results that were available during my care of the patient were reviewed by me and considered in my medical decision making (see chart for details).    MDM Rules/Calculators/A&P                          Iv ns. Stat labs. Continuous pulse ox and cardiac monitoring.   MDM Number of Diagnoses or Management Options   Amount and/or Complexity of Data Reviewed Clinical lab tests: ordered and reviewed Tests in the medicine section of CPT: ordered and reviewed Discussion of test results with the performing providers: yes Decide to obtain previous medical records or to obtain history from someone other than the patient: yes Obtain history from someone other than the patient: yes Review and summarize past medical records: yes Discuss the patient with other providers: yes  Risk of Complications, Morbidity, and/or Mortality Presenting problems: high Diagnostic procedures: high Management options: high   Reviewed nursing notes and prior charts for additional history.  Reviewed labs from pcp office, hgb is 5.7. recent vascular u/s c/w dvt. Given low hgb, will transfuse 2 units prbc.   Labs reviewed/interpreted by me - hgb very low, 5.7. stool is brown in color, heme positive.   protonix iv.   Medicine service consulted for admission. Discussed with hospitalists, incl hgb, heme pos stool, xarelto for recent dvt, etc. - they will admit. They request gi consult - gi consulted.   CRITICAL CARE RE:  acute symptomatic anemia hgb 5.7 requiring emergent transfusion.  Performed by: Mirna Mires Total critical care time: 45 minutes Critical care time was exclusive of separately billable procedures and treating other patients. Critical care was necessary to treat or prevent imminent or life-threatening deterioration. Critical care was time spent personally by me on the following activities: development of treatment plan with patient and/or surrogate as well as nursing, discussions with consultants, evaluation of patient's response to treatment, examination of patient, obtaining history from patient or surrogate, ordering and performing treatments and interventions, ordering and review of laboratory studies, ordering and review of radiographic studies, pulse oximetry and re-evaluation of patient's condition.     Final Clinical Impression(s) / ED Diagnoses Final diagnoses:  None    Rx / DC Orders ED Discharge Orders    None       Lajean Saver, MD 11/21/20 [phone]

## 2020-11-21 NOTE — Patient Instructions (Addendum)
It is good to meet you today. We will draw a CBC and BMET STAT  now in the office.  We will order a VQ scan today to evaluate for PE. Increase your oxygen as needed to maintain your oxygen sats at greater than 88%.  We will order a 2 D  Echo today.  We will call you with the results of your labs today Your HGB was 5.7, we are advising you go to the ED at [facility] for blood transfusion and careful monitoring while you are receiving blood.  You will be advised there on your Xaralto dosing, and if it is safe to continue.  Follow up with Dr. Lamonte Sakai or myself in 2 weeks or sooner as needed

## 2020-11-21 NOTE — Progress Notes (Signed)
History of Present Illness [name] [name] is a 69 y.o. female with PAH, ILD, ( Prednisone 5 mg daily), scleroderma, CKD Stage III, iron deficiency anemia, and chronic respiratory failure. ( 3 L) She is followed by Dr. Lamonte Sakai.   11/21/2020  Pt. Presents for acute visit for worsening shortness of breath. Patient has had a new diagnosis for RLE DVT on 11/8 and was started on Xaralto. She presents today stating her shortness of breath is worse since starting on Xaralto. She stated Xaralto on 11/07/2020 after DVT diagnosis. She has had worsening shortness of breath since about 1 week after starting on Xaralto. She states she has had obvious  bleeding, no dark stools since starting her Xaralto.  .  She has stopped taking her iron as she was told it would interfere with her xaralto. She denies any chest or back pain, she is just aware of significant increase in her SOB. She is usually  able to ambulate with her oxygen without issues. Over the last week she has been unable to walk to the bathroom from her chair.She usually wears her oxygen at 2-3 L. She is currently on 2 L. She does have chronic kidney disease. Her last creatinine was 1.5.Last HGB was 10.2. She does take Demedex 40 mg BID and aldactone 25 once daily. Per her husband, who takes excellent care of her, she has had some variation in her BP lately. She has had a few days where she has been below her baseline. Today her BP was 416 systolic in the office.CXR,  CBC , BNP and BMET are all pending.   Addendum: 11/21/2020 Critical Lab value was called to the office .  HGB 5.7 HCT 17.5 Platelets are 386K Creatinine is 1.57, BUN 52, GFR 33.07 BNP has been ordered as an add on.  Na 134 Pt. And husband called and directed to the ED for blood transfusion and careful monitoring while receiving blood volume.  He did not want to go to the ED. I explained this was our recommendation as the patient has multiple organ failures. He has agreed to go to the [facility]  ED. I have called ahead and spoken to the Triage RN. They understand she needs blood transfusion and careful monitoring.   Test Results:11/21/2020 CXR      CBC Latest Ref Rng & Units 10/25/2020 10/04/2020 07/08/2020  WBC 3.8 - 10.8 Thousand/uL - - 4.5  Hemoglobin 12.0 - 15.0 g/dL 10.2(L) 10.2(L) 10.0(L)  Hematocrit 36 - 46 % 30.0(L) 30.0(L) 30.4(L)  Platelets 140 - 400 Thousand/uL - - 246    BMP Latest Ref Rng & Units 10/25/2020 10/04/2020 07/08/2020  Glucose 70 - 99 mg/dL 94 91 81  BUN 8 - 23 mg/dL 36(H) 35(H) 40(H)  Creatinine 0.44 - 1.00 mg/dL 1.50(H) 1.50(H) 1.44(H)  BUN/Creat Ratio 6 - 22 (calc) - - 28(H)  Sodium 135 - 145 mmol/L 136 135 136  Potassium 3.5 - 5.1 mmol/L 3.7 3.8 4.2  Chloride 98 - 111 mmol/L 92(L) 95(L) 95(L)  CO2 20 - 32 mmol/L - - 32  Calcium 8.6 - 10.4 mg/dL - - 9.1    BNP    Component Value Date/Time   BNP 303.8 (H) 02/22/2015 1230    ProBNP    Component Value Date/Time   PROBNP 305.0 (H) 09/06/2015 1221    PFT No results found for: FEV1PRE, FEV1POST, FVCPRE, FVCPOST, TLC, DLCOUNC, PREFEV1FVCRT, PSTFEV1FVCRT  DG Chest 2 View  Result Date: 11/21/2020 CLINICAL DATA:  Shortness of breath EXAM:  CHEST - 2 VIEW COMPARISON:  May 03, 2020 FINDINGS: Diffuse interstitial thickening throughout the lungs is again noted. There is no consolidation. There is cardiomegaly with pulmonary venous hypertension. No adenopathy. There is aortic atherosclerosis. There is midthoracic dextroscoliosis with upper lumbar levoscoliosis. IMPRESSION: There is cardiac prominence with pulmonary vascular congestion superimposed on chronic interstitial lung disease. It is difficult to exclude mild superimposed interstitial edema. No consolidation or pleural effusions. No adenopathy. Aortic Atherosclerosis (ICD10-I70.0). Electronically Signed   By: Lowella Grip III M.D.   On: 11/21/2020 13:07   PERIPHERAL VASCULAR CATHETERIZATION  Result Date: 10/25/2020 Patient name: [name] [name] MRN: [id number] DOB: 1951-08-20 Sex: female 10/25/2020 Pre-operative Diagnosis: Bilateral lower extremity ulcers Post-operative diagnosis:  Same Surgeon:  Annamarie Major Procedure Performed:  1.  Ultrasound-guided access, right femoral artery  2.  Angioplasty, left anterior tibial artery  3.  Angioplasty, left tibioperoneal trunk  4.  Conscious sedation, 46 minutes Indications: The patient has previously undergone percutaneous intervention of the right leg.  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  She is back today for intervention. Procedure:  The patient was identified in the holding area and taken to room 8.  The patient was then placed supine on the table and prepped and draped in the usual sterile fashion.  A time out was called.  Conscious sedation was administered with the use of IV fentanyl and Versed under continuous physician and nurse monitoring.  Heart rate, blood pressure, and oxygen saturation were continuously monitored.  Total sedation time was 47 minutes.  Ultrasound was used to evaluate the right common femoral artery.  It was patent .  A digital ultrasound image was acquired.  A micropuncture needle was used to access the right common femoral artery under ultrasound guidance.  An 018 wire was advanced without resistance and a micropuncture sheath was placed.  The 018 wire was removed and a benson wire was placed.  The micropuncture sheath was exchanged for a 5 french sheath.  The aortic bifurcation was crossed and a Omni Flush cath was advanced into the left external iliac artery and left leg runoff was performed Findings:  Left Lower Extremity: The left common femoral profundofemoral artery are widely patent.  The superficial femoral artery is patent throughout its course.  There is diffuse calcification with mild luminal narrowing.  The popliteal artery is patent throughout its course.  The dominant runoff vessel is anterior  tibial artery.  Previous images in a different angle suggested 60 to 70% proximal anterior tibial artery stenosis.  There was also a greater than 70% stenosis at the origin of the tibioperoneal trunk.  The dominant vessel across the ankle is anterior tibial artery Intervention: At this point the decision was made to intervene.  A Rosen wire was inserted followed by placement of 5 French 45 cm Ansell 1 sheath.  The patient was fully heparinized.  I then used a V-18 wire with the support of a Sterling 3 x 40 balloon.  The anterior tibial artery was selected.  Balloon angioplasty was performed at the proximal portion of the anterior tibial artery at a atmospheres for 2 minutes.  Follow-up imaging revealed resolution of the stenosis however there did appear to be a more distal lesion at this time.  A second balloon inflation was performed and this resolved after 2 minutes.  Next attention was turned towards the tibioperoneal trunk lesion.  The peroneal artery was selected with  the wire.  The 3 x 40 balloon was then inserted and balloon angioplasty was performed of the tibioperoneal trunk and the proximal peroneal artery.  Unfortunately the balloon ruptured following insufflation.  I removed the balloon and then performed angiography which showed resolution of the tibioperoneal trunk lesion despite suboptimal timing of angioplasty.  I elected to leave this alone.  Catheters and wires were removed.  The patient taken the holding of her sheath pull with her coagulation profile correct. 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  Both lesions were successfully treated with a 3 mm balloon. Theotis Burrow, M.D., [facility] [place] Office: [phone] Pager:  [phone]  VAS [place] LOWER EXTREMITY VENOUS (DVT)  Result Date: 11/07/2020  Lower Venous DVT Study Indications: Pain, Swelling, and ulceration.   Limitations: Poor ultrasound/tissue interface.  Comparison Study: 12/16/2017: Rt- No evidence of deep vein thrombosis Performing Technologist: Ivan Croft  Examination Guidelines: A complete evaluation includes B-mode imaging, spectral Doppler, color Doppler, and power Doppler as needed of all accessible portions of each vessel. Bilateral testing is considered an integral part of a complete examination. Limited examinations for reoccurring indications may be performed as noted. The reflux portion of the exam is performed with the patient in reverse Trendelenburg.  +---------+---------------+---------+-----------+----------+--------------+ RIGHT    CompressibilityPhasicitySpontaneityPropertiesThrombus Aging +---------+---------------+---------+-----------+----------+--------------+ CFV      Full           Yes      Yes                                 +---------+---------------+---------+-----------+----------+--------------+ SFJ      Full                                                        +---------+---------------+---------+-----------+----------+--------------+ FV Prox  Full                                                        +---------+---------------+---------+-----------+----------+--------------+ FV Mid   Full           Yes      Yes                                 +---------+---------------+---------+-----------+----------+--------------+ FV DistalPartial                                      Acute          +---------+---------------+---------+-----------+----------+--------------+ PFV      Full                                                        +---------+---------------+---------+-----------+----------+--------------+ POP      Partial  Yes      Yes                  Acute          +---------+---------------+---------+-----------+----------+--------------+ PTV      Full                                                         +---------+---------------+---------+-----------+----------+--------------+ PERO     Full                                                        +---------+---------------+---------+-----------+----------+--------------+ Gastroc  Full                                                        +---------+---------------+---------+-----------+----------+--------------+   +----+---------------+---------+-----------+----------+--------------+ LEFTCompressibilityPhasicitySpontaneityPropertiesThrombus Aging +----+---------------+---------+-----------+----------+--------------+ CFV Full           Yes      Yes                                 +----+---------------+---------+-----------+----------+--------------+     Summary:  RIGHT: - Findings consistent with acute deep vein thrombosis involving the right distal femoral vein, and right popliteal vein. - No cystic structure found in the popliteal fossa.  - Results were called to Dr. Dellia Nims and given to Dr. Trula Slade per patient request (11/07/2020).  LEFT: - No evidence of common femoral vein obstruction.  *See table(s) above for measurements and observations. Electronically signed by Harold Barban MD on 11/07/2020 at 4:10:18 PM.    Final      Past medical hx Past Medical History:  Diagnosis Date  . Anemia   . Arthritis   . DOE (dyspnea on exertion)   . Epistaxis   . History of chicken pox   . Hypertension   . Hypothyroidism   . Leg ulcer ([place])   . Melanoma ([place]) 1999  . 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 Non Hodgkin's lymphoma ([place])    Treated with chemo 2010, felt to be cured.  Marland Kitchen PAH (pulmonary artery hypertension) ([place])   . Peripheral arterial occlusive disease ([place])   . Pulmonary hypertension ([place])   . Requires supplemental oxygen    2 liters-3liters when moving  . 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 Smoking status: Never Smoker  . Smokeless tobacco: Never Used  Vaping Use  . Vaping Use:  Never used  Substance Use Topics  . Alcohol use: Yes    Alcohol/week: 0.0 standard drinks    Comment: 1-2 drinks per day wine.liquor or beer  . Drug use: No    Ms.Quesinberry reports that she has never smoked. She has never used smokeless tobacco. She reports current alcohol use. She reports that she does not use drugs.  Tobacco Cessation: Never Smoker  Past surgical hx, Family hx, Social hx all reviewed.  Current Outpatient Medications on File Prior  to Visit  Medication Sig  . acetaminophen (TYLENOL) 650 MG CR tablet Take 1,300 mg by mouth in the morning and at bedtime.  . Biotin 5000 MCG TABS Take 5,000 mcg by mouth daily.  Marland Kitchen CALCIUM-MAGNESIUM-VITAMIN D PO Take 2 tablets by mouth daily.  . Cholecalciferol (VITAMIN D) 2000 units CAPS Take 2,000 Units by mouth daily.  . citalopram (CELEXA) 20 MG tablet TAKE 1 TABLET BY MOUTH EVERYDAY AT BEDTIME (Patient taking differently: Take 20 mg by mouth at bedtime. )  . clopidogrel (PLAVIX) 75 MG tablet TAKE 1 TABLET BY MOUTH EVERY DAY (Patient taking differently: Take 75 mg by mouth daily. )  . doxycycline (MONODOX) 100 MG capsule Take 100 mg by mouth 2 (two) times daily.  Marland Kitchen FERREX 150 150 MG capsule TAKE 1 CAPSULE BY MOUTH TWICE A DAY (Patient taking differently: Take 150 mg by mouth 2 (two) times daily. )  . fish oil-omega-3 fatty acids 1000 MG capsule Take 1,000 mg by mouth 2 (two) times daily.   . folic acid (FOLVITE) 1 MG tablet TAKE 1 TABLET BY MOUTH EVERY DAY (Patient taking differently: Take 1 mg by mouth daily. )  . levothyroxine (SYNTHROID) 50 MCG tablet TAKE 1 TABLET BY MOUTH DAILY BEFORE BREAKFAST (Patient taking differently: Take 50 mcg by mouth daily before breakfast. )  . loperamide (IMODIUM) 2 MG capsule Take 2 mg by mouth daily as needed for diarrhea or loose stools.   . magnesium oxide (MAG-OX) 400 (241.3 Mg) MG tablet Take 0.5 tablets (200 mg total) by mouth 2 (two) times daily.  . Multiple Vitamin (MULTIVITAMIN WITH MINERALS) TABS  tablet Take 1 tablet by mouth daily.  . OPSUMIT 10 MG tablet Take 1 tablet (12m) by mouth daily. (Patient taking differently: Take 10 mg by mouth daily. )  . OXYGEN Inhale 2-3 L into the lungs continuous.   . pantoprazole (PROTONIX) 40 MG tablet Take 1 tablet (40 mg total) by mouth 2 (two) times daily.  . potassium chloride 20 MEQ/15ML (10%) SOLN TAKE 2 TEASPOONFUL (DILUTED IN WATER) ONCE DAILY (Patient taking differently: Take 13 mEq by mouth daily. 10 ml)  . predniSONE (DELTASONE) 5 MG tablet Take 5 mg by mouth daily.  . Probiotic CAPS Take 1 capsule by mouth daily.  . RESTASIS 0.05 % ophthalmic emulsion Place 1 drop into both eyes 2 (two) times daily.  .Marland KitchenRIVAROXABAN (XARELTO) VTE STARTER PACK (15 & 20 MG TABLETS) Follow package directions: Take one 190mtablet by mouth twice a day. On day 22, switch to one 2041mablet once a day. Take with food.  . rosuvastatin (CRESTOR) 5 MG tablet Take 1 tablet every Monday, Wednesday, and Friday. (Patient taking differently: Take 5 mg by mouth every Monday, Wednesday, and Friday. )  . SANTYL ointment Apply 1 application topically daily.  . sildenafil (REVATIO) 20 MG tablet TAKE 3 TABLETS (60 MG TOTAL) BY MOUTH 3 (THREE) TIMES DAILY. PLEASE CALL FOR OFFICE VISIT [phone] spironolactone (ALDACTONE) 25 MG tablet TAKE 1 TABLET BY MOUTH EVERY DAY  . torsemide (DEMADEX) 20 MG tablet Take 40 mg by mouth 2 (two) times daily.  . valACYclovir (VALTREX) 500 MG tablet Take 1 tablet (500 mg total) by mouth 2 (two) times daily. (Patient taking differently: Take 500 mg by mouth daily as needed (lip sore). )  . vitamin B-12 (CYANOCOBALAMIN) 1000 MCG tablet Take 1,000 mcg by mouth daily.   No current facility-administered medications on file prior to visit.     Allergies  Allergen Reactions  . Levofloxacin Other (See Comments)    FLU LIKE SYMPTOM   . Sulfa Antibiotics Rash and Other (See Comments)    Face peeled Severe rash with significant peeling of face.  No airway involvement per patient   . Doxycycline Hyclate Swelling    SWELLING REACTION UNSPECIFIED  "TABLETS ONLY - CAPSULES ARE TOLERATED FINE"  . Penicillins Rash    Has patient had a PCN reaction causing immediate rash, facial/tongue/throat swelling, SOB or lightheadedness with hypotension: no Has patient had a PCN reaction causing severe rash involving mucus membranes or skin necrosis: no Has patient had a PCN reaction that required hospitalization: no Has patient had a PCN reaction occurring within the last 10 years: {no If all of the above answers are "NO", then may proceed with Cephalosporin use.    Review Of Systems:  Constitutional:   No  weight loss, night sweats,  Fevers, chills,+  fatigue, or  lassitude.  HEENT:   No headaches,  Difficulty swallowing,  Tooth/dental problems, or  Sore throat,                No sneezing, itching, ear ache, nasal congestion, post nasal drip,   CV:  No chest pain,  Orthopnea, PND, + swelling in lower extremities,No  anasarca, dizziness, palpitations, syncope.   GI  No heartburn, indigestion, abdominal pain, nausea, vomiting, diarrhea, change in bowel habits, loss of appetite, bloody stools.   Resp: + shortness of breath with exertion less at rest.  No excess mucus, no productive cough,  No non-productive cough,  No coughing up of blood.  No change in color of mucus.  No wheezing.  No chest wall deformity  Skin: no rash or lesions.  GU: no dysuria, change in color of urine, no urgency or frequency.  No flank pain, no hematuria   MS:  No joint pain or swelling.  No decreased range of motion.  No back pain.  Psych:  No change in mood or affect. No depression or anxiety.  No memory loss.   Vital Signs BP (!) 122/42 (BP Location: Left Arm, Cuff Size: Normal)   Pulse 88   Temp 98.4 F (36.9 C) (Oral)   Ht 5' 7.5" (1.715 m)   Wt 122 lb (55.3 kg)   SpO2 90%   BMI 18.83 kg/m    Physical Exam:  General- No distress,  A&Ox3, pleasant ,  in NAD wearing 2 L oxygen ENT: No sinus tenderness, TM clear, pale nasal mucosa, no oral exudate,no post nasal drip, no LAN Cardiac: S1, S2, regular rate and rhythm, no murmur Chest: No wheeze/ rales/ dullness; no accessory muscle use, no nasal flaring, no sternal retractions, + crackles per bases Abd.: Soft Non-tender, ND, BS + Ext: No clubbing cyanosis, Trace right LE edema, leg is wrapped 2/2 ulcerations Neuro:  Deconditioned at baseline, MAE x 4, A&O x 3, in a wheelchair Skin: No rashes,No lesions,  warm and dry, wound to right lower extremity, wrapped, no drainage noted Psych: anxious   Assessment/Plan  Worsening Dyspnea since starting Xaralto 11/8 ( DVT RLE) HGB 5.7 in the office today ( last documented 10/25/20 was 10.2) HCT 17.5 ( 10/2 was 30) No dark stools or bleeding noted Hx. Of GI oozing in past with ASA therapy Plan CXR today>> appears wet with vascular congestion/ ILD CBC, BMET, BNP ordered CBC with critical result>>5/7 Called with HGB result>> 5.7 Called to Proceed to ED for blood transfusion >> husband Juanda Crumble verbalized understanding and is proceeding  to [facility] I have called ahead , spoken with Triage RN and alerted her re: HGB of 5.7 Consider EGD V Q Scan to evaluate for PE in setting of DVT and worsening dyspnea Will need careful monitoring as patient has vascular congestion on CXR today ( 11/22) , is on Demedex 40 mg BID and Aldactone 25 mg daily. Creatinine is 1.57, GFR is 33.07 BP has been soft today. Follow up in 2 weeks with Dr. Lamonte Sakai or Judson Roch NP Please contact office for sooner follow up if symptoms do not improve or worsen or seek emergency care    [place] Takes Aldactone and Demedex 40 BID Has not missed any doses Creatinine 1.57 HGB 5.7 Plan Diuresis as renal function allows Transfuse for HGB > 8  Source of bleed ( HGB 5.7) Previous history of GI oozing on ASA Previously has seen Dr. Watt Climes Plan Consider EGD  This appointment was over 60 minutes  long with over 50% of the time being direct face to face patient care, assessment , plan of care , and follow up,    Magdalen Spatz, NP 11/21/2020  1:45 PM

## 2020-11-21 NOTE — Telephone Encounter (Signed)
I received the labs as they were ordered stat. The patient was directed to the ED for blood transfusion.Results were called to the patient and her husband who verbalized understanding.

## 2020-11-21 NOTE — ED Triage Notes (Signed)
Pt arrived via walk in, sent for abnormal lab. Had blood work done this morning, states hbg 5.7. States she has been extremely fatigued x2 weeks.

## 2020-11-21 NOTE — Telephone Encounter (Signed)
Received call report from Santiago Glad  with [facility] on patient's LAB done on 11/22/20. SG please review the result/impression copied below:  HgB 5.7 Hct 17.5  Please advise, thank you.

## 2020-11-21 NOTE — H&P (Addendum)
History and Physical    [name] KFM:[id number] DOB: Sep 25, 1951 DOA: 11/21/2020  PCP: [name], MD  Patient coming from: Home.   I have personally briefly reviewed patient's old medical records in [place]  Chief Complaint: sent from Pulmonology office.   HPI: [name] [name] is a 69 y.o. female with medical history significant of pulmonary hypertension, hypertension, hyperlipidemia, , NHL, Hypothyroidism, chronic leg wounds, scleroderma, peripheral vascular disease, recently diagnosed with right LE DVT, started on xarelto on 11/07/2020, presented to pulmonary clinic for dyspnea on exertion, dry cough since 4 to 5 days. She denies any nausea, vomiting, abd pain, diarrhea, hematochezia. She denies any chest pain, syncope. She reports dizziness, lightheadedness, unable to ambulate more than a few feet due to SOB. Stat labs drawn in the clinic showed a hemoglobin of 5.7, and she was sent to ED for evaluation of symptomatic anemia. On arrival to ED, she was found to be febrile, hypoxic on 2 lit of [place] oxygen, BP 122/27mHG,  Her stool for occult blood is positive . Labs were significant for sodium of 134, creatinine of 1.57, BUN of 52, repeat hemoglobin of 5.3. CXR  shows cardiac prominence with pulmonary vascular congestion superimposed on chronic interstitial lung disease. It is difficult  to exclude mild superimposed interstitial edema.    [facility] of [facility]  [facility] consulted by EDP. Pt underwent prior colonoscopy and EGD by Dr EOletta Lamasin 2016 and 2017.    Review of Systems: As per HPI otherwise "All others reviewed and are negative,"   Past Medical History:  Diagnosis Date  . Anemia   . Arthritis   . DOE (dyspnea on exertion)   . Epistaxis   . History of chicken pox   . Hypertension   . Hypothyroidism   . Leg ulcer ([place]   . Melanoma ([facility] 1999  . 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[place]   . Non Hodgkin's  lymphoma ([place]    Treated with chemo 2010, felt to be cured.  .Marland KitchenPAH (pulmonary artery hypertension) ([place]   . Peripheral arterial occlusive disease ([facility]   . Pulmonary hypertension ([place]   . Requires supplemental oxygen    2 liters-3liters when moving  . Sclerodermia generalized ([facility]     Past Surgical History:  Procedure Laterality Date  . ABDOMINAL AORTOGRAM W/LOWER EXTREMITY Bilateral 10/04/2020   Procedure: ABDOMINAL AORTOGRAM W/LOWER EXTREMITY;  Surgeon: BSerafina Mitchell MD;  [place] [place];  Service: Cardiovascular;  Laterality: Bilateral;  . AMPUTATION Right 10/24/2017   Procedure: RAY resection right index;  Surgeon: KDaryll Brod MD;  [place]  Service: Orthopedics;  Laterality: Right;  Axillary block  . AMPUTATION Right 11/21/2018   Procedure: AMPUTATION RAY RIGHT MIDDLE FINGER;  Surgeon: KDaryll Brod MD;  [place]  Service: Orthopedics;  Laterality: Right;  . AMPUTATION Right 10/22/2019   Procedure: AMPUTATION DIGIT RIGHT RING AND SMALL FINGER, DIGITAL SYMPATHECTOMY RIGHT RADIAL ULNAR ARCH;  Surgeon: KDaryll Brod MD;  [place]  Service: Orthopedics;  Laterality: Right;  AXILARY  . apligraph  12-04-2102,   12-18-2012   bilateral calves   [facility]  . biopsy on cerebellum    . COLONOSCOPY WITH PROPOFOL Left 10/07/2015   Procedure: COLONOSCOPY WITH PROPOFOL;  Surgeon: JLaurence Spates MD;  [place]  Service: Endoscopy;  Laterality: Left;  . ENDARTERECTOMY FEMORAL Right 09/14/2016   Procedure:  ENDARTERECTOMY FEMORAL RIGHT;  Surgeon: [name], MD;  Location: [facility];  Service: Vascular;  Laterality: Right;  . ENDOVENOUS ABLATION SAPHENOUS VEIN W/ LASER  07-24-2012   right greater saphenous vein by [name], M.D.   . ENDOVENOUS ABLATION SAPHENOUS VEIN W/ LASER  10-16-2012   left greater saphenous vein  by Dr. [name]  . ENDOVENOUS ABLATION SAPHENOUS VEIN W/ LASER Right 09-01-2015   endovenous  laser ablation right greater saphenous vein by [name] MD  . ESOPHAGOGASTRODUODENOSCOPY N/A 05/07/2016   Procedure: ESOPHAGOGASTRODUODENOSCOPY (EGD);  Surgeon: [name], MD;  Location: [facility] ENDOSCOPY;  Service: Endoscopy;  Laterality: N/A;  . ESOPHAGOGASTRODUODENOSCOPY (EGD) WITH PROPOFOL Left 10/07/2015   Procedure: ESOPHAGOGASTRODUODENOSCOPY (EGD) WITH PROPOFOL;  Surgeon: [name], MD;  Location: [place];  Service: Endoscopy;  Laterality: Left;  . GIVENS CAPSULE STUDY N/A 07/31/2016   Procedure: GIVENS CAPSULE STUDY;  Surgeon: [name], MD;  Location[facility];  Service: Endoscopy;  Laterality: N/A;  . LOWER EXTREMITY ANGIOGRAPHY N/A 10/25/2020   Procedure: LOWER EXTREMITY ANGIOGRAPHY - Left;  Surgeon: Serafina Mitchell, MD;  Location: [place] CV LAB;  Service: Cardiovascular;  Laterality: N/A;  . melonoma removes Right    behind right knee  . OPEN REDUCTION INTERNAL FIXATION (ORIF) DISTAL RADIAL FRACTURE Left 11/24/2019   Procedure: OPEN REDUCTION INTERNAL FIXATION (ORIF) DISTAL RADIAL FRACTURE;  Surgeon: Altamese Durant, MD;  Location: [place];  Service: Orthopedics;  Laterality: Left;  . PATCH ANGIOPLASTY Right 09/14/2016   Procedure: PATCH ANGIOPLASTY RIGHT FEMORAL ARTERY USING HEMASHIELD PLATINUM FINESSE PATCH;  Surgeon: [name], MD;  Location: [place];  Service: Vascular;  Laterality: Right;  . PERIPHERAL VASCULAR BALLOON ANGIOPLASTY Right 10/04/2020   Procedure: PERIPHERAL VASCULAR BALLOON ANGIOPLASTY;  Surgeon: Serafina Mitchell, MD;  Location: [place] CV LAB;  Service: Cardiovascular;  Laterality: Right;  SFA/PT/AT/PTtrunk  . PERIPHERAL VASCULAR BALLOON ANGIOPLASTY Left 10/25/2020   Procedure: PERIPHERAL VASCULAR BALLOON ANGIOPLASTY;  Surgeon: Serafina Mitchell, MD;  Location: [place] CV LAB;  Service: Cardiovascular;  Laterality: Left;  anterior tibial and tibioperoneal trunk  . PERIPHERAL VASCULAR CATHETERIZATION N/A 09/07/2016   Procedure: Abdominal Aortogram  w/Lower Extremity;  Surgeon: [name], MD;  Location: [place] CV LAB;  Service: Cardiovascular;  Laterality: N/A;  . RIGHT HEART CATH N/A 04/19/2017   Procedure: Right Heart Cath;  Surgeon: [name], MD;  Location: [place] CV LAB;  Service: Cardiovascular;  Laterality: N/A;  . RIGHT HEART CATHETERIZATION N/A 11/14/2012   Procedure: RIGHT HEART CATH;  Surgeon: [name], MD;  Location: [facility] CATH LAB;  Service: Cardiovascular;  Laterality: N/A;  . rt little toe removal  10/1998  . stent left thigh  3/11  . TENDON TRANSFER Right 10/24/2017   Procedure: transfer extensor indecus propius/extensor digitorum commomus index to middle ring and small;  Surgeon: Daryll Brod, MD;  Location: [place];  Service: Orthopedics;  Laterality: Right;  . TONSILLECTOMY    . TONSILLECTOMY AND ADENOIDECTOMY    . TUNNELED VENOUS CATHETER PLACEMENT  01/2013  . ULNAR HEAD EXCISION Right 10/24/2017   Procedure: darrach right ulna;  Surgeon: Daryll Brod, MD;  Location: [place];  Service: Orthopedics;  Laterality: Right;  . VEIN LIGATION AND STRIPPING  05/16/2012   Procedure: VEIN LIGATION AND STRIPPING;  Surgeon: [name], MD;  Location: [facility] OR;  Service: Vascular;  Laterality: Right;  Irrigation and Debridement right lower leg, ligation of saphenous vein.    Social History  reports  that she has never smoked. She has never used smokeless tobacco. She reports current alcohol use. She reports that she does not use drugs.  Allergies  Allergen Reactions  . Levofloxacin Other (See Comments)    FLU LIKE SYMPTOM   . Sulfa Antibiotics Rash and Other (See Comments)    Face peeled Severe rash with significant peeling of face. No airway involvement per patient   . Doxycycline Hyclate Swelling    SWELLING REACTION UNSPECIFIED  "TABLETS ONLY - CAPSULES ARE TOLERATED FINE"  . Penicillins Rash    Has patient had a PCN reaction causing immediate rash, facial/tongue/throat swelling, SOB or  lightheadedness with hypotension: no Has patient had a PCN reaction causing severe rash involving mucus membranes or skin necrosis: no Has patient had a PCN reaction that required hospitalization: no Has patient had a PCN reaction occurring within the last 10 years: {no If all of the above answers are "NO", then may proceed with Cephalosporin use.    Family History  Problem Relation Age of Onset  . Lupus Father   . Lymphoma Father   . Hyperlipidemia Mother   . Cancer Daughter        sarcoma  . Colon cancer Other    Family history negative for GI bleed.   Prior to Admission medications   Medication Sig Start Date End Date Taking? Authorizing Provider  acetaminophen (TYLENOL) 650 MG CR tablet Take 1,300 mg by mouth in the morning and at bedtime.    [provider]  Biotin 5000 MCG TABS Take 5,000 mcg by mouth daily.    [provider]  CALCIUM-MAGNESIUM-VITAMIN D PO Take 2 tablets by mouth daily.    [provider]  Cholecalciferol (VITAMIN D) 2000 units CAPS Take 2,000 Units by mouth daily.    [provider]  citalopram (CELEXA) 20 MG tablet TAKE 1 TABLET BY MOUTH EVERYDAY AT BEDTIME Patient taking differently: Take 20 mg by mouth at bedtime.  06/30/20   [name], MD  clopidogrel (PLAVIX) 75 MG tablet TAKE 1 TABLET BY MOUTH EVERY DAY Patient taking differently: Take 75 mg by mouth daily.  03/07/20   Larey Dresser, MD  doxycycline (MONODOX) 100 MG capsule Take 100 mg by mouth 2 (two) times daily. 11/17/20   [provider]  FERREX 150 150 MG capsule TAKE 1 CAPSULE BY MOUTH TWICE A DAY Patient taking differently: Take 150 mg by mouth 2 (two) times daily.  08/08/20   [name], MD  fish oil-omega-3 fatty acids 1000 MG capsule Take 1,000 mg by mouth 2 (two) times daily.     [provider]  folic acid (FOLVITE) 1 MG tablet TAKE 1 TABLET BY MOUTH EVERY DAY Patient taking differently: Take 1 mg by mouth daily.  09/15/20   [name], MD  levothyroxine (SYNTHROID) 50 MCG tablet TAKE 1 TABLET BY MOUTH DAILY BEFORE BREAKFAST Patient taking differently: Take 50 mcg by mouth daily before breakfast.  08/31/20   Baxley, Cresenciano Lick, MD  loperamide (IMODIUM) 2 MG capsule Take 2 mg by mouth daily as needed for diarrhea or loose stools.     [provider]  magnesium oxide (MAG-OX) 400 (241.3 Mg) MG tablet Take 0.5 tablets (200 mg total) by mouth 2 (two) times daily. 11/15/19   Benito Mccreedy, MD  Multiple Vitamin (MULTIVITAMIN WITH MINERALS) TABS tablet Take 1 tablet by mouth daily.    [provider]  OPSUMIT 10 MG tablet Take 1 tablet (66m) by  mouth daily. Patient taking differently: Take 10 mg by mouth daily.  04/29/20   Bensimhon, Shaune Pascal, MD  OXYGEN Inhale 2-3 L into the lungs continuous.     [provider]  pantoprazole (PROTONIX) 40 MG tablet Take 1 tablet (40 mg total) by mouth 2 (two) times daily. 10/05/20   [name], MD  potassium chloride 20 MEQ/15ML (10%) SOLN TAKE 2 TEASPOONFUL (DILUTED IN WATER) ONCE DAILY Patient taking differently: Take 13 mEq by mouth daily. 10 ml 08/02/20   [name], MD  predniSONE (DELTASONE) 5 MG tablet Take 5 mg by mouth daily. 04/04/20   [provider]  Probiotic CAPS Take 1 capsule by mouth daily.    [provider]  RESTASIS 0.05 % ophthalmic emulsion Place 1 drop into both eyes 2 (two) times daily. 08/28/19   [provider]  RIVAROXABAN Alveda Reasons) VTE STARTER PACK (15 & 20 MG TABLETS) Follow package directions: Take one 71m tablet by mouth twice a day. On day 22, switch to one 24mtablet once a day. Take with food. 11/07/20   BrSerafina MitchellMD  rosuvastatin (CRESTOR) 5 MG tablet Take 1 tablet every Monday, Wednesday, and Friday. Patient taking differently: Take 5 mg by mouth every Monday, Wednesday, and Friday.  08/29/20   Bensimhon, DaShaune PascalMD  SANTYL ointment Apply 1 application topically daily. 11/16/20   [provider]  sildenafil (REVATIO) 20 MG tablet TAKE 3 TABLETS (60 MG TOTAL) BY MOUTH 3 (THREE) TIMES DAILY. PLEASE CALL FOR OFFICE VISIT [phone]/25/21   Bensimhon, DaShaune PascalMD  spironolactone (ALDACTONE) 25 MG tablet TAKE 1 TABLET BY MOUTH EVERY DAY 10/24/20   Bensimhon, DaShaune PascalMD  torsemide (DEMADEX) 20 MG tablet Take 40 mg by mouth 2 (two) times daily.    [provider]  valACYclovir (VALTREX) 500 MG tablet Take 1 tablet (500 mg total) by mouth 2 (two) times daily. Patient taking differently: Take 500 mg by mouth daily as needed (lip sore).  09/02/19   BaElby ShowersMD  vitamin B-12 (CYANOCOBALAMIN) 1000 MCG tablet Take 1,000 mcg by mouth daily.    [provider]    Physical Exam: Vitals:   11/21/20 1800 11/21/20 1852 11/21/20 1900 11/21/20 1909  BP: (!) 146/43 (!) 143/44 (!) 140/35 (!) 145/34  Pulse:  65 70 66  Resp: _0 Temp:  98.4 F (36.9 C)  98.7 F (37.1 C)  TempSrc:  Oral  Oral  SpO2: 100% 100% 100% 100%  Weight:      Height:        Constitutional: NAD, calm, comfortable Vitals:   11/21/20 1800 11/21/20 1852 11/21/20 1900 11/21/20 1909  BP: (!) 146/43 (!) 143/44 (!) 140/35 (!) 145/34  Pulse:  65 70 66  Resp: _1 Temp:  98.4 F (36.9 C)  98.7 F (37.1 C)  TempSrc:  Oral  Oral  SpO2: 100% 100% 100% 100%  Weight:      Height:       Eyes: PERRL, lids and conjunctivae normal ENMT: Mucous membranes are moist.   Neck: normal, supple, no masses, no thyromegaly Respiratory: air entry fair bilaterally.  Cardiovascular: Regular rate and rhythm, No extremity edema. 2+ pedal pulses. No carotid bruits.  Abdomen: no tenderness, no masses palpated. No hepatosplenomegaly. Bowel sounds positive.  Musculoskeletal: no clubbing / cyanosis.  Skin: no rashes, lesions, ulcers. No induration Neurologic: CN 2-12 grossly intact. Sensation intact, Psychiatric: Normal judgment  and insight.  Labs on Admission: I have personally reviewed  following labs and imaging studies  CBC: Recent Labs  Lab 11/21/20 1249 11/21/20 1603  WBC 10.6* 9.6  NEUTROABS 9.3*  --   HGB 5.7 Repeated and verified X2.* 5.3*  HCT 17.5 Repeated and verified X2.* 17.3*  MCV 105.9* 113.1*  PLT 386.0 262    Basic Metabolic Panel: Recent Labs  Lab 11/21/20 1249  NA 134*  K 4.1  CL 94*  CO2 26  GLUCOSE 115*  BUN 52*  CREATININE 1.57*  CALCIUM 9.0    GFR: Estimated Creatinine Clearance: 28.2 mL/min (A) (by C-G formula based on SCr of 1.57 mg/dL (H)).  Liver Function Tests: No results for input(s): AST, ALT, ALKPHOS, BILITOT, PROT, ALBUMIN in the last 168 hours.  Urine analysis:    Component Value Date/Time   COLORURINE STRAW (A) 11/23/2019 0008   APPEARANCEUR CLEAR 11/23/2019 0008   LABSPEC 1.006 11/23/2019 0008   PHURINE 5.0 11/23/2019 0008   GLUCOSEU NEGATIVE 11/23/2019 0008   HGBUR NEGATIVE 11/23/2019 0008   BILIRUBINUR NEG 02/11/2020 1030   KETONESUR NEGATIVE 11/23/2019 0008   PROTEINUR Negative 02/11/2020 1030   PROTEINUR NEGATIVE 11/23/2019 0008   UROBILINOGEN 0.2 02/11/2020 1030   NITRITE NEG 02/11/2020 1030   NITRITE NEGATIVE 11/23/2019 0008   LEUKOCYTESUR Negative 02/11/2020 1030   LEUKOCYTESUR NEGATIVE 11/23/2019 0008    Radiological Exams on Admission: DG Chest 2 View  Result Date: 11/21/2020 CLINICAL DATA:  Shortness of breath EXAM: CHEST - 2 VIEW COMPARISON:  May 03, 2020 FINDINGS: Diffuse interstitial thickening throughout the lungs is again noted. There is no consolidation. There is cardiomegaly with pulmonary venous hypertension. No adenopathy. There is aortic atherosclerosis. There is midthoracic dextroscoliosis with upper lumbar levoscoliosis. IMPRESSION: There is cardiac prominence with pulmonary vascular congestion superimposed on chronic interstitial lung disease. It is difficult to exclude mild superimposed interstitial edema. No consolidation or pleural effusions. No adenopathy. Aortic Atherosclerosis  (ICD10-I70.0). Electronically Signed   By: Lowella Grip III M.D.   On: 11/21/2020 13:07    EKG: pending.   Assessment/Plan Active Problems:   Peripheral arterial occlusive disease (HCC)   Essential hypertension   Hyperlipidemia   Pulmonary hypertension associated with systemic disorder (HCC)   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HCC)   Scleroderma (HCC)   Hypothyroidism   GI bleed   Dyspnea on exertion possibly from symptomatic anemia and fluid overload.  Baseline hemoglobin around 10, dropped to 5.7 - to 5.3.  Possibly GI bleed . Stool for occult blood is positive.  2 units of Prbc transfusion ordered.  GI consulted , NPO post midnight.  IV PPI BID.  Hold xarelto and plavix.  In view of recent DVT, will get V/Q scan for evaluation of PE.    Mild acute on chronic diastolic heart failure/ pulmonary hypertension:  - start her IV lasix 80 mg BID.  Continue with Revatio , spironolactone 25 mg daily.  Strict in take and output. Daily weights.   Hypothyroidism Resume synthroid.    Bilateral lower extremity wounds:  Wound care consult.    Peripheral vascular disease:  - hold the plavix for GI bleed.    Stage 3b CKD: Creatinine at baseline .    Scleroderma:  Resume prednisone 5 mg daily.    Essential Hypertension:  Well controlled BP parameters.    Acute DVT: Recently diagnosed. xarelto on hold for GI bleed.  Will get IR consult for IVC filter placement.     DVT prophylaxis:scd's Code Status:  FULL CODE.  Family Communication:  Discussed with husband at bedside.  Disposition Plan:   Patient is from:  Home.   Anticipated DC to:  Home.   Anticipated DC date:  11/24/20  Anticipated DC barriers: Gi bleed.  Consults called:  Gastroenterology.  Admission status:  Inpatient, stepdown.   Severity of Illness: The appropriate patient status for this patient is INPATIENT. Inpatient status is judged to be reasonable and necessary in order to provide the required  intensity of service to ensure the patient's safety. The patient's presenting symptoms, physical exam findings, and initial radiographic and laboratory data in the context of their chronic comorbidities is felt to place them at high risk for further clinical deterioration. Furthermore, it is not anticipated that the patient will be medically stable for discharge from the hospital within 2 midnights of admission.    * I certify that at the point of admission it is my clinical judgment that the patient will require inpatient hospital care spanning beyond 2 midnights from the point of admission due to high intensity of service, high risk for further deterioration and high frequency of surveillance required.*     Hosie Poisson MD [facility]  How to contact the [facility] Attending or Consulting provider [place] or covering provider during after hours [place], for this patient?   1. Check the care team in [facility] and look for a) attending/consulting [facility] provider listed and b) the [facility] team listed 2. Log into [email or web address] and use [place]'s universal password to access. If you do not have the password, please contact the hospital operator. 3. Locate the [facility] provider you are looking for under [facility] and page to a number that you can be directly reached. 4. If you still have difficulty reaching the provider, please page the [facility] (Director on Call) for the Hospitalists listed on amion for assistance.  11/21/2020, 8:14 PM

## 2020-11-21 NOTE — Telephone Encounter (Signed)
Duplicate msg  [name] already sent msg to provider with critical results

## 2020-11-22 ENCOUNTER — Inpatient Hospital Stay (HOSPITAL_COMMUNITY): Payer: Medicare Other

## 2020-11-22 ENCOUNTER — Ambulatory Visit: Payer: Medicare Other | Admitting: Internal Medicine

## 2020-11-22 ENCOUNTER — Encounter (HOSPITAL_COMMUNITY): Payer: Self-pay | Admitting: Internal Medicine

## 2020-11-22 DIAGNOSIS — I82431 Acute embolism and thrombosis of right popliteal vein: Secondary | ICD-10-CM | POA: Diagnosis not present

## 2020-11-22 DIAGNOSIS — E440 Moderate protein-calorie malnutrition: Secondary | ICD-10-CM | POA: Insufficient documentation

## 2020-11-22 HISTORY — PX: IR IVC FILTER PLMT / S&I /IMG GUID/MOD SED: IMG701

## 2020-11-22 LAB — HEMOGLOBIN AND HEMATOCRIT, BLOOD
HCT: 24.3 % — ABNORMAL LOW (ref 36.0–46.0)
HCT: 25.6 % — ABNORMAL LOW (ref 36.0–46.0)
HCT: 25.2 % — ABNORMAL LOW (ref 36.0–46.0)
Hemoglobin: 7.6 g/dL — ABNORMAL LOW (ref 12.0–15.0)
Hemoglobin: 8 g/dL — ABNORMAL LOW (ref 12.0–15.0)
Hemoglobin: 7.8 g/dL — ABNORMAL LOW (ref 12.0–15.0)

## 2020-11-22 LAB — BASIC METABOLIC PANEL
Anion gap: 13 (ref 5–15)
BUN: 45 mg/dL — ABNORMAL HIGH (ref 8–23)
CO2: 27 mmol/L (ref 22–32)
Calcium: 8.6 mg/dL — ABNORMAL LOW (ref 8.9–10.3)
Chloride: 98 mmol/L (ref 98–111)
Creatinine, Ser: 1.42 mg/dL — ABNORMAL HIGH (ref 0.44–1.00)
GFR, Estimated: 40 mL/min — ABNORMAL LOW (ref >60)
Glucose, Bld: 90 mg/dL (ref 70–99)
Potassium: 3.7 mmol/L (ref 3.5–5.1)
Sodium: 138 mmol/L (ref 135–145)

## 2020-11-22 LAB — TYPE AND SCREEN
ABO/RH(D): B POS
Antibody Screen: NEGATIVE
Unit division: 0
Unit division: 0

## 2020-11-22 LAB — BPAM RBC
Blood Product Expiration Date: 202112032359
Blood Product Expiration Date: 202112032359
ISSUE DATE / TIME: 202111221848
ISSUE DATE / TIME: 202111222207
Unit Type and Rh: 7300
Unit Type and Rh: 7300

## 2020-11-22 LAB — MAGNESIUM: Magnesium: 1.9 mg/dL (ref 1.7–2.4)

## 2020-11-22 MED ORDER — LIDOCAINE HCL (PF) 1 % IJ SOLN
INTRAMUSCULAR | Status: AC | PRN
  Administered 2020-11-22: 10 mL via INTRADERMAL

## 2020-11-22 MED ORDER — SODIUM CHLORIDE 0.9 % IV SOLN: INTRAVENOUS | Status: DC

## 2020-11-22 MED ORDER — FENTANYL CITRATE (PF) 100 MCG/2ML IJ SOLN
INTRAMUSCULAR | Status: AC
  Filled 2020-11-22: qty 2

## 2020-11-22 MED ORDER — COLLAGENASE 250 UNIT/GM EX OINT
1.0000 "application " | TOPICAL_OINTMENT | CUTANEOUS | Status: DC
  Administered 2020-11-22: 1 via TOPICAL

## 2020-11-22 MED ORDER — BOOST / RESOURCE BREEZE PO LIQD CUSTOM: 1.0000 | Freq: Two times a day (BID) | ORAL | Status: DC

## 2020-11-22 MED ORDER — PROSOURCE PLUS PO LIQD: 30.0000 mL | Freq: Every day | ORAL | Status: DC

## 2020-11-22 MED ORDER — ORAL CARE MOUTH RINSE
15.0000 mL | Freq: Two times a day (BID) | OROMUCOSAL | Status: DC
  Administered 2020-11-23: 15 mL via OROMUCOSAL

## 2020-11-22 MED ORDER — TECHNETIUM TO 99M ALBUMIN AGGREGATED
3.7000 | Freq: Once | INTRAVENOUS | Status: AC | PRN
  Administered 2020-11-22: 3.7 via INTRAVENOUS

## 2020-11-22 MED ORDER — LIDOCAINE HCL 1 % IJ SOLN
INTRAMUSCULAR | Status: AC
  Filled 2020-11-22: qty 20

## 2020-11-22 MED ORDER — MIDAZOLAM HCL 2 MG/2ML IJ SOLN
INTRAMUSCULAR | Status: AC | PRN
  Administered 2020-11-22: 0.5 mg via INTRAVENOUS

## 2020-11-22 MED ORDER — FENTANYL CITRATE (PF) 100 MCG/2ML IJ SOLN
INTRAMUSCULAR | Status: AC | PRN
Start: 2020-11-22 — End: 2020-11-22
  Administered 2020-11-22: 25 ug via INTRAVENOUS

## 2020-11-22 MED ORDER — MIDAZOLAM HCL 2 MG/2ML IJ SOLN
INTRAMUSCULAR | Status: AC
  Filled 2020-11-22: qty 2

## 2020-11-22 MED ORDER — IODIXANOL 320 MG/ML IV SOLN: 50.0000 mL | Freq: Once | INTRAVENOUS | Status: DC | PRN

## 2020-11-22 MED ORDER — ADULT MULTIVITAMIN W/MINERALS CH
1.0000 | ORAL_TABLET | Freq: Every day | ORAL | Status: DC
  Administered 2020-11-23: 1 via ORAL
  Filled 2020-11-22: qty 1

## 2020-11-22 MED ORDER — COLLAGENASE 250 UNIT/GM EX OINT: TOPICAL_OINTMENT | CUTANEOUS | Status: DC

## 2020-11-22 NOTE — Progress Notes (Signed)
Chief Complaint: Patient was seen in consultation today for IVC filter  Referring Physician(s): *Dr. Karleen Hampshire  Supervising Physician: Jacqulynn Cadet  Patient Status: [facility] - In-pt  History of Present Illness: [name] [name] is a 69 y.o. female with multiple medical problems. Was diagnosed with acute LE DVT a few weeks ago and was started on Xarelto. She already takes Plavix with her cardiac history. She has been admitted with profound anemia and heme positive stools. Anticoagulation has been stopped but given recent DVT and risks, IR is asked to place IVC filter. PMHx, meds, labs, imaging, allergies reviewed. Feels well, no recent fevers, chills, illness. Has been NPO today as directed. Family at bedside.   Past Medical History:  Diagnosis Date  . Anemia   . Arthritis   . DOE (dyspnea on exertion)   . Epistaxis   . History of chicken pox   . Hypertension   . Hypothyroidism   . Leg ulcer ([place])   . Melanoma ([place]) 1999  . 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[place])   . Non Hodgkin's lymphoma ([place])    Treated with chemo 2010, felt to be cured.  Marland Kitchen PAH (pulmonary artery hypertension) ([place])   . Peripheral arterial occlusive disease ([place])   . Pulmonary hypertension ([place])   . Requires supplemental oxygen    2 liters-3liters when moving  . 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 ABDOMINAL AORTOGRAM W/LOWER EXTREMITY Bilateral 10/04/2020   Procedure: ABDOMINAL AORTOGRAM W/LOWER EXTREMITY;  Surgeon: Serafina Mitchell, MD;  Location: [place] [facility];  Service: Cardiovascular;  Laterality: Bilateral;  . AMPUTATION Right 10/24/2017   Procedure: RAY resection right index;  Surgeon: Daryll Brod, MD;  Location: [place];  Service: Orthopedics;  Laterality: Right;  Axillary block  . AMPUTATION Right 11/21/2018   Procedure: AMPUTATION RAY RIGHT MIDDLE FINGER;  Surgeon: Daryll Brod, MD;  Location: [place];  Service: Orthopedics;   Laterality: Right;  . AMPUTATION Right 10/22/2019   Procedure: AMPUTATION DIGIT RIGHT RING AND SMALL FINGER, DIGITAL SYMPATHECTOMY RIGHT RADIAL ULNAR ARCH;  Surgeon: Daryll Brod, MD;  Location: [place];  Service: Orthopedics;  Laterality: Right;  AXILARY  . apligraph  12-04-2102,   12-18-2012   bilateral calves   [name]  . biopsy on cerebellum    . COLONOSCOPY WITH PROPOFOL Left 10/07/2015   Procedure: COLONOSCOPY WITH PROPOFOL;  Surgeon: [name], MD;  Location: [place];  Service: Endoscopy;  Laterality: Left;  . ENDARTERECTOMY FEMORAL Right 09/14/2016   Procedure: ENDARTERECTOMY FEMORAL RIGHT;  Surgeon: [name], MD;  Location: [facility] OR;  Service: Vascular;  Laterality: Right;  . ENDOVENOUS ABLATION SAPHENOUS VEIN W/ LASER  07-24-2012   right greater saphenous vein by [name], M.D.   . ENDOVENOUS ABLATION SAPHENOUS VEIN W/ LASER  10-16-2012   left greater saphenous vein  by Dr. [name]  . ENDOVENOUS ABLATION SAPHENOUS VEIN W/ LASER Right 09-01-2015   endovenous laser ablation right greater saphenous vein by [name] MD  . ESOPHAGOGASTRODUODENOSCOPY N/A 05/07/2016   Procedure: ESOPHAGOGASTRODUODENOSCOPY (EGD);  Surgeon: [name], MD;  Location: [facility] ENDOSCOPY;  Service: Endoscopy;  Laterality: N/A;  . ESOPHAGOGASTRODUODENOSCOPY (EGD) WITH PROPOFOL Left 10/07/2015   Procedure: ESOPHAGOGASTRODUODENOSCOPY (EGD) WITH PROPOFOL;  Surgeon: [name], MD;  Location: [place];  Service: Endoscopy;  Laterality: Left;  . GIVENS CAPSULE STUDY N/A 07/31/2016   Procedure: GIVENS CAPSULE STUDY;  Surgeon: [name], MD;  Location: [place];  Service: Endoscopy;  Laterality: N/A;  . LOWER EXTREMITY ANGIOGRAPHY N/A 10/25/2020   Procedure: LOWER EXTREMITY ANGIOGRAPHY - Left;  Surgeon: Serafina Mitchell, MD;  Location: [place] [facility];  Service: Cardiovascular;  Laterality: N/A;  . melonoma removes Right    behind right knee  . OPEN REDUCTION INTERNAL FIXATION  (ORIF) DISTAL RADIAL FRACTURE Left 11/24/2019   Procedure: OPEN REDUCTION INTERNAL FIXATION (ORIF) DISTAL RADIAL FRACTURE;  Surgeon: Altamese [place], MD;  Location: [place];  Service: Orthopedics;  Laterality: Left;  . PATCH ANGIOPLASTY Right 09/14/2016   Procedure: PATCH ANGIOPLASTY RIGHT FEMORAL ARTERY USING HEMASHIELD PLATINUM FINESSE PATCH;  Surgeon: [name], MD;  Location: [place];  Service: Vascular;  Laterality: Right;  . PERIPHERAL VASCULAR BALLOON ANGIOPLASTY Right 10/04/2020   Procedure: PERIPHERAL VASCULAR BALLOON ANGIOPLASTY;  Surgeon: Serafina Mitchell, MD;  Location: [place] [facility];  Service: Cardiovascular;  Laterality: Right;  SFA/PT/AT/PTtrunk  . PERIPHERAL VASCULAR BALLOON ANGIOPLASTY Left 10/25/2020   Procedure: PERIPHERAL VASCULAR BALLOON ANGIOPLASTY;  Surgeon: Serafina Mitchell, MD;  Location: [place] [facility];  Service: Cardiovascular;  Laterality: Left;  anterior tibial and tibioperoneal trunk  . PERIPHERAL VASCULAR CATHETERIZATION N/A 09/07/2016   Procedure: Abdominal Aortogram w/Lower Extremity;  Surgeon: [name], MD;  Location: [place] [facility];  Service: Cardiovascular;  Laterality: N/A;  . RIGHT HEART CATH N/A 04/19/2017   Procedure: Right Heart Cath;  Surgeon: [name], MD;  Location: [place] [facility];  Service: Cardiovascular;  Laterality: N/A;  . RIGHT HEART CATHETERIZATION N/A 11/14/2012   Procedure: RIGHT HEART CATH;  Surgeon: [name], MD;  Location: [facility] CATH LAB;  Service: Cardiovascular;  Laterality: N/A;  . rt little toe removal  10/1998  . stent left thigh  3/11  . TENDON TRANSFER Right 10/24/2017   Procedure: transfer extensor indecus propius/extensor digitorum commomus index to middle ring and small;  Surgeon: Daryll Brod, MD;  Location: [place];  Service: Orthopedics;  Laterality: Right;  . TONSILLECTOMY    . TONSILLECTOMY AND ADENOIDECTOMY    . TUNNELED VENOUS CATHETER PLACEMENT  01/2013  . ULNAR HEAD EXCISION Right 10/24/2017    Procedure: darrach right ulna;  Surgeon: Daryll Brod, MD;  Location: [place];  Service: Orthopedics;  Laterality: Right;  . VEIN LIGATION AND STRIPPING  05/16/2012   Procedure: VEIN LIGATION AND STRIPPING;  Surgeon: [name], MD;  Location: [facility] OR;  Service: Vascular;  Laterality: Right;  Irrigation and Debridement right lower leg, ligation of saphenous vein.    Allergies: Levofloxacin, Sulfa antibiotics, Doxycycline hyclate, and Penicillins  Medications:  Current Facility-Administered Medications:  .  acetaminophen (TYLENOL) tablet 1,000 mg, 1,000 mg, Oral, Q6H PRN, Lang Snow, NP, 1,000 mg at 11/22/20 1028 .  benzonatate (TESSALON) capsule 200 mg, 200 mg, Oral, TID PRN, Hosie Poisson, MD .  Chlorhexidine Gluconate Cloth 2 % PADS 6 each, 6 each, Topical, Daily, Hosie Poisson, MD, 6 each at 11/21/20 1730 .  citalopram (CELEXA) tablet 20 mg, 20 mg, Oral, QHS, Hosie Poisson, MD, 20 mg at 11/21/20 2119 .  collagenase (SANTYL) ointment, , Topical, QHS, Hosie Poisson, MD, Given at 11/21/20 2124 .  cycloSPORINE (RESTASIS) 0.05 % ophthalmic emulsion 1 drop, 1 drop, Both Eyes, BID, Hosie Poisson, MD, 1 drop at 11/22/20 1030 .  folic acid (FOLVITE) tablet 1 mg, 1 mg, Oral, Daily, Karleen Hampshire, Vijaya, MD, 1 mg at 11/22/20 1026 .  furosemide (LASIX) injection 80 mg, 80 mg, Intravenous, Q12H, Hosie Poisson, MD, 80  mg at 11/22/20 1032 .  levothyroxine (SYNTHROID) tablet 50 mcg, 50 mcg, Oral, Q0600, Hosie Poisson, MD, 50 mcg at 11/22/20 0556 .  mupirocin ointment (BACTROBAN) 2 % 1 application, 1 application, Nasal, BID, Hosie Poisson, MD, 1 application at 03/47/42 1038 .  pantoprazole (PROTONIX) injection 40 mg, 40 mg, Intravenous, Q12H, Hosie Poisson, MD, 40 mg at 11/22/20 0557 .  predniSONE (DELTASONE) tablet 5 mg, 5 mg, Oral, QAC breakfast, Hosie Poisson, MD, 5 mg at 11/22/20 1028 .  sildenafil (REVATIO) tablet 60 mg, 60 mg, Oral, TID, Hosie Poisson, MD, 60 mg at 11/22/20 1026 .   spironolactone (ALDACTONE) tablet 25 mg, 25 mg, Oral, Daily, Hosie Poisson, MD, 25 mg at 11/22/20 1029 .  vitamin B-12 (CYANOCOBALAMIN) tablet 1,000 mcg, 1,000 mcg, Oral, Daily, Hosie Poisson, MD, 1,000 mcg at 11/22/20 1029    Family History  Problem Relation Age of Onset  . Lupus Father   . Lymphoma Father   . Hyperlipidemia Mother   . Cancer Daughter        sarcoma  . Colon cancer Other     Social History   Socioeconomic History  . Marital status: Married    Spouse name: Not on file  . Number of children: 1  . Years of education: Not on file  . Highest education level: Not on file  Occupational History  . Occupation: homemaker  Tobacco Use  . Smoking status: Never Smoker  . Smokeless tobacco: Never Used  Vaping Use  . Vaping Use: Never used  Substance and Sexual Activity  . Alcohol use: Yes    Alcohol/week: 0.0 standard drinks    Comment: 1-2 drinks per day wine.liquor or beer  . Drug use: No  . Sexual activity: Not Currently  Other Topics Concern  . Not on file  Social History Narrative  . Not on file   Social Determinants of Health   Financial Resource Strain:   . Difficulty of Paying Living Expenses: Not on file  Food Insecurity:   . Worried About Charity fundraiser in the Last Year: Not on file  . Ran Out of Food in the Last Year: Not on file  Transportation Needs:   . Lack of Transportation (Medical): Not on file  . Lack of Transportation (Non-Medical): Not on file  Physical Activity:   . Days of Exercise per Week: Not on file  . Minutes of Exercise per Session: Not on file  Stress:   . Feeling of Stress : Not on file  Social Connections:   . Frequency of Communication with Friends and Family: Not on file  . Frequency of Social Gatherings with Friends and Family: Not on file  . Attends Religious Services: Not on file  . Active Member of Clubs or Organizations: Not on file  . Attends Archivist Meetings: Not on file  . Marital Status: Not  on file    Review of Systems: A 12 point ROS discussed and pertinent positives are indicated in the HPI above.  All other systems are negative.  Review of Systems  Vital Signs: BP (!) 168/51   Pulse 71   Temp 98.4 F (36.9 C) (Oral)   Resp 20   Ht _0  (1.702 m)   Wt 54 kg   SpO2 99%   BMI 18.65 kg/m   Physical Exam Constitutional:      Appearance: Normal appearance.  HENT:     Mouth/Throat:     Mouth: Mucous membranes are moist.  Pharynx: Oropharynx is clear.  Cardiovascular:     Rate and Rhythm: Normal rate and regular rhythm.     Heart sounds: Normal heart sounds.  Pulmonary:     Effort: Pulmonary effort is normal. No respiratory distress.     Breath sounds: Normal breath sounds.  Skin:    General: Skin is warm and dry.  Neurological:     General: No focal deficit present.     Mental Status: She is alert and oriented to person, place, and time.  Psychiatric:        Mood and Affect: Mood normal.        Thought Content: Thought content normal.        Judgment: Judgment normal.      Imaging: DG Chest 2 View  Result Date: 11/21/2020 CLINICAL DATA:  Shortness of breath EXAM: CHEST - 2 VIEW COMPARISON:  May 03, 2020 FINDINGS: Diffuse interstitial thickening throughout the lungs is again noted. There is no consolidation. There is cardiomegaly with pulmonary venous hypertension. No adenopathy. There is aortic atherosclerosis. There is midthoracic dextroscoliosis with upper lumbar levoscoliosis. IMPRESSION: There is cardiac prominence with pulmonary vascular congestion superimposed on chronic interstitial lung disease. It is difficult to exclude mild superimposed interstitial edema. No consolidation or pleural effusions. No adenopathy. Aortic Atherosclerosis (ICD10-I70.0). Electronically Signed   By: Lowella Grip III M.D.   On: 11/21/2020 13:07   NM Pulmonary Perfusion  Result Date: 11/22/2020 CLINICAL DATA:  Respiratory failure and shortness of breath EXAM:  NUCLEAR MEDICINE PERFUSION LUNG SCAN TECHNIQUE: Perfusion images were obtained in multiple projections after intravenous injection of radiopharmaceutical. Ventilation scans intentionally deferred if perfusion scan and chest x-ray adequate for interpretation during COVID 19 epidemic. RADIOPHARMACEUTICALS:  3.7 mCi Tc-30mMAA IV COMPARISON:  05/04/2019 FINDINGS: Improved perfusion is noted bilaterally with resolution of previously seen subsegmental defects. No large segmental defect is identified to suggest pulmonary embolism. IMPRESSION: No evidence of pulmonary embolism. Electronically Signed   By: MInez CatalinaM.D.   On: 11/22/2020 10:03   PERIPHERAL VASCULAR CATHETERIZATION  Result Date: 10/25/2020 Patient name: JDASHONDA BONNEAUMRN: [id number]: 9Oct 30, 1952Sex: female 10/25/2020 Pre-operative Diagnosis: Bilateral lower extremity ulcers Post-operative diagnosis:  Same Surgeon:  WAnnamarie MajorProcedure Performed:  1.  Ultrasound-guided access, right femoral artery  2.  Angioplasty, left anterior tibial artery  3.  Angioplasty, left tibioperoneal trunk  4.  Conscious sedation, 46 minutes Indications: The patient has previously undergone percutaneous intervention of the right leg.  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  She is back today for intervention. Procedure:  The patient was identified in the holding area and taken to room 8.  The patient was then placed supine on the table and prepped and draped in the usual sterile fashion.  A time out was called.  Conscious sedation was administered with the use of IV fentanyl and Versed under continuous physician and nurse monitoring.  Heart rate, blood pressure, and oxygen saturation were continuously monitored.  Total sedation time was 47 minutes.  Ultrasound was used to evaluate the right common femoral artery.  It was patent .  A digital ultrasound image was acquired.  A micropuncture needle was  used to access the right common femoral artery under ultrasound guidance.  An 018 wire was advanced without resistance and a micropuncture sheath was placed.  The 018 wire was removed and a benson wire was placed.  The micropuncture sheath was exchanged  for a 5 french sheath.  The aortic bifurcation was crossed and a Omni Flush cath was advanced into the left external iliac artery and left leg runoff was performed Findings:  Left Lower Extremity: The left common femoral profundofemoral artery are widely patent.  The superficial femoral artery is patent throughout its course.  There is diffuse calcification with mild luminal narrowing.  The popliteal artery is patent throughout its course.  The dominant runoff vessel is anterior tibial artery.  Previous images in a different angle suggested 60 to 70% proximal anterior tibial artery stenosis.  There was also a greater than 70% stenosis at the origin of the tibioperoneal trunk.  The dominant vessel across the ankle is anterior tibial artery Intervention: At this point the decision was made to intervene.  A Rosen wire was inserted followed by placement of 5 French 45 cm Ansell 1 sheath.  The patient was fully heparinized.  I then used a V-18 wire with the support of a Sterling 3 x 40 balloon.  The anterior tibial artery was selected.  Balloon angioplasty was performed at the proximal portion of the anterior tibial artery at a atmospheres for 2 minutes.  Follow-up imaging revealed resolution of the stenosis however there did appear to be a more distal lesion at this time.  A second balloon inflation was performed and this resolved after 2 minutes.  Next attention was turned towards the tibioperoneal trunk lesion.  The peroneal artery was selected with the wire.  The 3 x 40 balloon was then inserted and balloon angioplasty was performed of the tibioperoneal trunk and the proximal peroneal artery.  Unfortunately the balloon ruptured following insufflation.  I removed the  balloon and then performed angiography which showed resolution of the tibioperoneal trunk lesion despite suboptimal timing of angioplasty.  I elected to leave this alone.  Catheters and wires were removed.  The patient taken the holding of her sheath pull with her coagulation profile correct. 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  Both lesions were successfully treated with a 3 mm balloon. Theotis Burrow, M.D., [facility] [place] Office: [phone] Pager:  [phone]  VAS [place] LOWER EXTREMITY VENOUS (DVT)  Result Date: 11/07/2020  Lower Venous DVT Study Indications: Pain, Swelling, and ulceration.  Limitations: Poor ultrasound/tissue interface.  Comparison Study: 12/16/2017: Rt- No evidence of deep vein thrombosis Performing Technologist: Ivan Croft  Examination Guidelines: A complete evaluation includes B-mode imaging, spectral Doppler, color Doppler, and power Doppler as needed of all accessible portions of each vessel. Bilateral testing is considered an integral part of a complete examination. Limited examinations for reoccurring indications may be performed as noted. The reflux portion of the exam is performed with the patient in reverse Trendelenburg.  +---------+---------------+---------+-----------+----------+--------------+ RIGHT    CompressibilityPhasicitySpontaneityPropertiesThrombus Aging +---------+---------------+---------+-----------+----------+--------------+ CFV      Full           Yes      Yes                                 +---------+---------------+---------+-----------+----------+--------------+ SFJ      Full                                                        +---------+---------------+---------+-----------+----------+--------------+  FV Prox  Full                                                         +---------+---------------+---------+-----------+----------+--------------+ FV Mid   Full           Yes      Yes                                 +---------+---------------+---------+-----------+----------+--------------+ FV DistalPartial                                      Acute          +---------+---------------+---------+-----------+----------+--------------+ PFV      Full                                                        +---------+---------------+---------+-----------+----------+--------------+ POP      Partial        Yes      Yes                  Acute          +---------+---------------+---------+-----------+----------+--------------+ PTV      Full                                                        +---------+---------------+---------+-----------+----------+--------------+ PERO     Full                                                        +---------+---------------+---------+-----------+----------+--------------+ Gastroc  Full                                                        +---------+---------------+---------+-----------+----------+--------------+   +----+---------------+---------+-----------+----------+--------------+ LEFTCompressibilityPhasicitySpontaneityPropertiesThrombus Aging +----+---------------+---------+-----------+----------+--------------+ CFV Full           Yes      Yes                                 +----+---------------+---------+-----------+----------+--------------+     Summary:  RIGHT: - Findings consistent with acute deep vein thrombosis involving the right distal femoral vein, and right popliteal vein. - No cystic structure found in the popliteal fossa.  - Results were called to Dr. Dellia Nims and given to Dr. Trula Slade per patient request (11/07/2020).  LEFT: - No evidence of common femoral vein obstruction.  *See table(s) above for measurements and observations. Electronically signed by Harold Barban MD on  11/07/2020 at 4:10:18  PM.    Final     Labs:  CBC: Recent Labs    11/26/19 0416 11/26/19 0416 07/08/20 1134 10/04/20 0629 11/21/20 1249 11/21/20 1603 11/22/20 0310 11/22/20 1013  WBC 7.8  --  4.5  --  10.6* 9.6  --   --   HGB 9.6*   < > 10.0*   < > 5.7 Repeated and verified X2.* 5.3* 7.6* 8.0*  HCT 30.1*   < > 30.4*   < > 17.5 Repeated and verified X2.* 17.3* 24.3* 25.6*  PLT 263  --  246  --  386.0 367  --   --    < > = values in this interval not displayed.    COAGS: Recent Labs    11/21/20 1603  INR 1.9*    BMP: Recent Labs    11/24/19 0251 11/24/19 0251 11/25/19 0445 11/25/19 0445 11/26/19 0822 01/11/20 1630 02/11/20 1008 02/11/20 1008 07/08/20 1134 07/08/20 1134 10/04/20 0629 10/25/20 1001 11/21/20 1249 11/22/20 0310  NA 135   < > 135   < > 139   < > 138   < > 136   < > 135 136 134* 138  K 3.8   < > 4.0   < > 3.7   < > 4.3   < > 4.2   < > 3.8 3.7 4.1 3.7  CL 97*   < > 99   < > 103   < > 99   < > 95*   < > 95* 92* 94* 98  CO2 26   < > 26   < > 26   < > 32  --  32  --   --   --  26 27  GLUCOSE 94   < > 138*   < > 88   < > 80   < > 81   < > 91 94 115* 90  BUN 20   < > 14   < > 8   < > 25   < > 40*   < > 35* 36* 52* 45*  CALCIUM 7.8*   < > 7.1*   < > 7.3*   < > 8.4*  --  9.1  --   --   --  9.0 8.6*  CREATININE 1.24*   < > 0.89   < > 0.73   < > 1.13*   < > 1.44*  --  1.50* 1.50* 1.57* 1.42*  GFRNONAA 44*   < > >60  --  >60  --   --   --  37*  --   --   --   --  40*  GFRAA 51*  --  >60  --  >60  --   --   --  43*  --   --   --   --   --    < > = values in this interval not displayed.    LIVER FUNCTION TESTS: Recent Labs    11/23/19 1455 07/08/20 1134  BILITOT 0.6 0.5  AST 45* 18  ALT 41 9  ALKPHOS 130*  --   PROT 6.9 6.8  ALBUMIN 3.3*  --     TUMOR MARKERS: No results for input(s): AFPTM, CEA, CA199, CHROMGRNA in the last 8760 hours.  Assessment and Plan: Recent LE DVT Anemia and heme positive stool. IVC filter indicated Labs  reviewed. Risks and benefits discussed with the patient including, but not limited to bleeding, infection, contrast induced renal  failure, filter fracture or migration which can lead to emergency surgery or even death, strut penetration with damage or irritation to adjacent structures and caval thrombosis.  All of the patient's questions were answered, patient is agreeable to proceed. Consent signed and in chart.     Thank you for this interesting consult.  I greatly enjoyed meeting [name] [name] and look forward to participating in their care.  A copy of this report was sent to the requesting provider on this date.  Electronically Signed: Ascencion Dike, PA-C 11/22/2020, 10:43 AM   I spent a total of 25 minutes in face to face in clinical consultation, greater than 50% of which was counseling/coordinating care for IVC filter

## 2020-11-22 NOTE — Anesthesia Preprocedure Evaluation (Addendum)
Anesthesia Evaluation  Patient identified by MRN, date of birth, ID band Patient awake    Reviewed: Allergy & Precautions, H&P , NPO status , Patient's Chart, lab work & pertinent test results  Airway Mallampati: II  TM Distance: >3 FB Neck ROM: full    Dental no notable dental hx.    Pulmonary shortness of breath (3L O2 at all times), with exertion, at rest and Long-Term Oxygen Therapy,  Interstitial lung disease   Pulmonary exam normal breath sounds clear to auscultation       Cardiovascular Exercise Tolerance: Good hypertension, + Peripheral Vascular Disease (on Xarelto and Plavix) and + DOE   Rhythm:regular Rate:Normal   16-Aug-2020 13:01:25 [id number] ROUTINE RECORD Normal sinus rhythm Incomplete right bundle branch block Possible Right ventricular hypertrophy T wave abnormality, consider anterolateral ischemia Abnormal ECG No significant change since last tracing Confirmed by Quay Burow [phone]) on 08/16/2020 5:17:29 PM   1. Left ventricular ejection fraction, by estimation, is 60 to 65%. The  left ventricle has normal function. The left ventricle has no regional  wall motion abnormalities. There is moderate concentric left ventricular  hypertrophy. Left ventricular  diastolic parameters are consistent with Grade I diastolic dysfunction  (impaired relaxation). Elevated left ventricular end-diastolic pressure.  The average left ventricular global longitudinal strain is -17.7 %.  2. Right ventricular systolic function is normal. The right ventricular  size is normal. There is normal pulmonary artery systolic pressure.  3. Left atrial size was mildly dilated.  4. Right atrial size was mildly dilated.  5. The mitral valve is normal in structure. Trivial mitral valve  regurgitation. No evidence of mitral stenosis.  6. The aortic valve is tricuspid. Aortic valve regurgitation is not   visualized. No aortic stenosis is present.  7. Aortic dilatation noted. There is mild dilatation at the level of the  sinuses of Valsalva measuring 36 mm.  8. The inferior vena cava is dilated in size with >50% respiratory  variability, suggesting right atrial pressure of 8 mmHg.    Neuro/Psych PSYCHIATRIC DISORDERS Depression negative neurological ROS     GI/Hepatic negative GI ROS, Neg liver ROS,   Endo/Other  Hypothyroidism   Renal/GU Renal disease  negative genitourinary   Musculoskeletal  (+) Arthritis ,   Abdominal   Peds  Hematology  (+) Blood dyscrasia, anemia ,   Anesthesia Other Findings Anemia  Arthritis  DOE  Hypertension  Hypothyroidism   Non Hodgkin's lymphoma     Treated with chemo 2010, felt to be cured.  Peripheral arterial occlusive disease   Requires supplemental oxygen  2 liters-3liters when moving Sclerodermia generalized      Reproductive/Obstetrics negative OB ROS                            Anesthesia Physical  Anesthesia Plan  ASA: III  Anesthesia Plan: MAC   Post-op Pain Management:    Induction:   PONV Risk Score and Plan: Ondansetron, Treatment may vary due to age or medical condition and TIVA  Airway Management Planned: Nasal Cannula and Simple Face Mask  Additional Equipment:   Intra-op Plan:   Post-operative Plan:   Informed Consent: I have reviewed the patients History and Physical, chart, labs and discussed the procedure including the risks, benefits and alternatives for the proposed anesthesia with the patient or authorized representative who has indicated his/her understanding and acceptance.       Plan Discussed with: Anesthesiologist and  CRNA  Anesthesia Plan Comments:        Anesthesia Quick Evaluation

## 2020-11-22 NOTE — Progress Notes (Signed)
Discussed with patient at time of OV 11/22. They verbalized understanding

## 2020-11-22 NOTE — Progress Notes (Signed)
These results were called to the patient 11/22 as soon as they were resulted.

## 2020-11-22 NOTE — TOC Initial Note (Signed)
Transition of Care [facility]) - Initial/Assessment Note    Patient Details  Name: [name] [name] MRN: [id number] Date of Birth: Jul 03, 1951  Transition of Care [facility]) CM/SW Contact:    Leeroy Cha, RN Phone Number: 11/22/2020, 7:43 AM  Clinical Narrative:                  69 y.o. female with medical history significant of pulmonary hypertension, hypertension, hyperlipidemia, , NHL, Hypothyroidism, chronic leg wounds, scleroderma, peripheral vascular disease, recently diagnosed with right LE DVT, started on xarelto on 11/07/2020, presented to pulmonary clinic for dyspnea on exertion, dry cough since 4 to 5 days. She denies any nausea, vomiting, abd pain, diarrhea, hematochezia. She denies any chest pain, syncope. She reports dizziness, lightheadedness, unable to ambulate more than a few feet due to SOB. Stat labs drawn in the clinic showed a hemoglobin of 5.7, and she was sent to ED for evaluation of symptomatic anemia. On arrival to ED, she was found to be febrile, hypoxic on 2 lit of [place] oxygen, BP 122/4mHG,  Her stool for occult blood is positive for 526. Labs were significant for sodium of 134, creatinine of 1.57, BUN of 52, repeat hemoglobin of 5.3. CXR  shows cardiac prominence with pulmonary vascular congestion superimposed on chronic interstitial lung disease. It is difficult  to exclude mild superimposed interstitial edema.  Plan- to return to home with self care/has family at home to help with care [id number] Following for progression Expected Discharge Plan: Home/Self Care Barriers to Discharge: No Barriers Identified   Patient Goals and CMS Choice Patient states their goals for this hospitalization and ongoing recovery are:: to go back to my home CMS Medicare.gov Compare Post Acute Care list provided to:: Patient Choice offered to / list presented to : Patient  Expected Discharge Plan and Services Expected Discharge Plan: Home/Self Care   Discharge Planning Services: CM  Consult   Living arrangements for the past 2 months: Single Family Home                                      Prior Living Arrangements/Services Living arrangements for the past 2 months: Single Family Home Lives with:: Spouse Patient language and need for interpreter reviewed:: Yes Do you feel safe going back to the place where you live?: Yes      Need for Family Participation in Patient Care: Yes (Comment) (husband) Care giver support system in place?: Yes (comment) (husband)   Criminal Activity/Legal Involvement Pertinent to Current Situation/Hospitalization: No - Comment as needed  Activities of Daily Living Home Assistive Devices/Equipment: Eyeglasses, Hearing aid, Bedside commode/3-in-1, Shower chair with back (bilateral hearing aides) ADL Screening (condition at time of admission) Patient's cognitive ability adequate to safely complete daily activities?: Yes Is the patient deaf or have difficulty hearing?: Yes (wears 2 hearing aides) Does the patient have difficulty seeing, even when wearing glasses/contacts?: No Does the patient have difficulty concentrating, remembering, or making decisions?: No Patient able to express need for assistance with ADLs?: Yes Does the patient have difficulty dressing or bathing?: Yes Independently performs ADLs?: No Communication: Independent Dressing (OT): Needs assistance Is this a change from baseline?: Pre-admission baseline Grooming: Independent Feeding: Independent Bathing: Needs assistance Is this a change from baseline?: Pre-admission baseline Toileting: Needs assistance Is this a change from baseline?: Pre-admission baseline In/Out Bed: Needs assistance Is this a change from baseline?: Pre-admission baseline Walks in  Home: Needs assistance Is this a change from baseline?: Pre-admission baseline Does the patient have difficulty walking or climbing stairs?: Yes Weakness of Legs: Both Weakness of Arms/Hands:  Both  Permission Sought/Granted                  Emotional Assessment Appearance:: Appears stated age Attitude/Demeanor/Rapport: Engaged Affect (typically observed): Calm Orientation: : Oriented to Place, Oriented to Self, Oriented to  Time, Oriented to Situation Alcohol / Substance Use: Not Applicable Psych Involvement: No (comment)  Admission diagnosis:  GI bleed [K92.2] Heme positive stool [R19.5] History of DVT (deep vein thrombosis) [Z86.718] CRI (chronic renal insufficiency), stage 3 (moderate) (HCC) [N18.30] Symptomatic anemia [D64.9] Patient Active Problem List   Diagnosis Date Noted  . GI bleed 11/21/2020  . Wrist fracture 11/24/2019  . Multiple closed stable fractures of pubic ramus ([place]) 11/23/2019  . Distal radius fracture, left 11/23/2019  . Fall at home, initial encounter 11/23/2019  . AMS (altered mental status) 11/12/2019  . Altered mental status 11/11/2019  . Hypomagnesemia 11/11/2019  . Osteoporosis 01/22/2019  . Hypothyroidism 06/16/2018  . Pulmonary hypertension associated with systemic disorder ([place]) 10/08/2017  . Dry gangrene ([place]) 09/11/2017  . Medication reaction 09/11/2017  . ILD (interstitial lung disease) ([place]) 07/22/2017  . Chronic respiratory failure ([place]) 10/31/2016  . Atherosclerosis of native arteries of right leg with ulceration of unspecified site ([place]) 09/14/2016  . Prolonged Q-T interval on ECG 07/29/2016  . CKD (chronic kidney disease), stage III ([place]) 07/29/2016  . Scleroderma ([place]) 05/06/2016  . Depression 04/24/2016  . Hypokalemia 10/07/2015  . 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 Allergic rhinitis 03/08/2015  . Varicose veins of lower extremities with ulcer and inflammation ([place]) 07/24/2012  . 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[place]) 04/08/2012  . Pulmonary hypertension associated with systemic disorder ([place]) 03/25/2012  . Peripheral arterial occlusive disease ([place]) 08/10/2011  . Essential  hypertension 08/10/2011  . Hyperlipidemia 08/10/2011   PCP:  [name], MD Pharmacy:   [id number] [place] [id number]: [phone]: [phone]    Social Determinants of Health (SDOH) Interventions    Readmission Risk Interventions Readmission Risk Prevention Plan 11/25/2019  Transportation Screening Complete  PCP or Specialist Appt within 3-5 Days Complete  HRI or HOvalComplete  Social Work Consult for [place]/Counseling Complete  Palliative Care Screening Not Applicable  Medication Review (Press photographer Complete  Some recent data might be hidden

## 2020-11-22 NOTE — Consult Note (Signed)
Reason for Consult: Subacute GI blood loss and patient on 2 blood thinners Referring Physician: Hospital team  [name] [name] is an 69 y.o. female.  HPI: Patient seen and examined and discussed with the hospital team as well as her husband in our office computer chart in the hospital computer chart was reviewed and she stopped taking her iron a few weeks ago because of possible interactions with her blood thinner and she had problems with subacute bleeding when she was on aspirin 4 years ago but that resolved when she stopped it and she has no GI symptoms but has been on Plavix for about a year and unfortunately developed a blood clot after extremity stenting and her previous work-up was reviewed and we answered all of her and her husband's questions  Past Medical History:  Diagnosis Date  . Anemia   . Arthritis   . DOE (dyspnea on exertion)   . Epistaxis   . History of chicken pox   . Hypertension   . Hypothyroidism   . Leg ulcer ([place])   . Melanoma ([place]) 1999  . 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[place])   . Non Hodgkin's lymphoma ([place])    Treated with chemo 2010, felt to be cured.  Marland Kitchen PAH (pulmonary artery hypertension) ([place])   . Peripheral arterial occlusive disease ([place])   . Pulmonary hypertension ([place])   . Requires supplemental oxygen    2 liters-3liters when moving  . 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 ABDOMINAL AORTOGRAM W/LOWER EXTREMITY Bilateral 10/04/2020   Procedure: ABDOMINAL AORTOGRAM W/LOWER EXTREMITY;  Surgeon: Serafina Mitchell, MD;  Location: [place] [facility];  Service: Cardiovascular;  Laterality: Bilateral;  . AMPUTATION Right 10/24/2017   Procedure: RAY resection right index;  Surgeon: Daryll Brod, MD;  Location: [place];  Service: Orthopedics;  Laterality: Right;  Axillary block  . AMPUTATION Right 11/21/2018   Procedure: AMPUTATION RAY RIGHT MIDDLE FINGER;  Surgeon: Daryll Brod, MD;  Location: [place];   Service: Orthopedics;  Laterality: Right;  . AMPUTATION Right 10/22/2019   Procedure: AMPUTATION DIGIT RIGHT RING AND SMALL FINGER, DIGITAL SYMPATHECTOMY RIGHT RADIAL ULNAR ARCH;  Surgeon: Daryll Brod, MD;  Location: [place];  Service: Orthopedics;  Laterality: Right;  AXILARY  . apligraph  12-04-2102,   12-18-2012   bilateral calves   [name]  . biopsy on cerebellum    . COLONOSCOPY WITH PROPOFOL Left 10/07/2015   Procedure: COLONOSCOPY WITH PROPOFOL;  Surgeon: [name], MD;  Location: [place];  Service: Endoscopy;  Laterality: Left;  . ENDARTERECTOMY FEMORAL Right 09/14/2016   Procedure: ENDARTERECTOMY FEMORAL RIGHT;  Surgeon: [name], MD;  [facility];  Service: Vascular;  Laterality: Right;  . ENDOVENOUS ABLATION SAPHENOUS VEIN W/ LASER  07-24-2012   right greater saphenous vein by [name], M.D.   . ENDOVENOUS ABLATION SAPHENOUS VEIN W/ LASER  10-16-2012   left greater saphenous vein  by Dr. [name]  . ENDOVENOUS ABLATION SAPHENOUS VEIN W/ LASER Right 09-01-2015   endovenous laser ablation right greater saphenous vein by [name] MD  . ESOPHAGOGASTRODUODENOSCOPY N/A 05/07/2016   Procedure: ESOPHAGOGASTRODUODENOSCOPY (EGD);  Surgeon: [name], MD;  Location: [facility] ENDOSCOPY;  Service: Endoscopy;  Laterality: N/A;  . ESOPHAGOGASTRODUODENOSCOPY (EGD) WITH PROPOFOL Left 10/07/2015   Procedure: ESOPHAGOGASTRODUODENOSCOPY (EGD) WITH PROPOFOL;  Surgeon: [name], MD;  Location: [place];  Service: Endoscopy;  Laterality: Left;  . GIVENS CAPSULE STUDY N/A  07/31/2016   Procedure: GIVENS CAPSULE STUDY;  Surgeon: [name], MD;  Location: [facility] ENDOSCOPY;  Service: Endoscopy;  Laterality: N/A;  . LOWER EXTREMITY ANGIOGRAPHY N/A 10/25/2020   Procedure: LOWER EXTREMITY ANGIOGRAPHY - Left;  Surgeon: Serafina Mitchell, MD;  Location: [place] CV LAB;  Service: Cardiovascular;  Laterality: N/A;  . melonoma removes Right    behind right knee  . OPEN REDUCTION  INTERNAL FIXATION (ORIF) DISTAL RADIAL FRACTURE Left 11/24/2019   Procedure: OPEN REDUCTION INTERNAL FIXATION (ORIF) DISTAL RADIAL FRACTURE;  Surgeon: Altamese Stratford, MD;  Location: [place];  Service: Orthopedics;  Laterality: Left;  . PATCH ANGIOPLASTY Right 09/14/2016   Procedure: PATCH ANGIOPLASTY RIGHT FEMORAL ARTERY USING HEMASHIELD PLATINUM FINESSE PATCH;  Surgeon: [name], MD;  Location: [place];  Service: Vascular;  Laterality: Right;  . PERIPHERAL VASCULAR BALLOON ANGIOPLASTY Right 10/04/2020   Procedure: PERIPHERAL VASCULAR BALLOON ANGIOPLASTY;  Surgeon: Serafina Mitchell, MD;  Location: [place] [facility];  Service: Cardiovascular;  Laterality: Right;  SFA/PT/AT/PTtrunk  . PERIPHERAL VASCULAR BALLOON ANGIOPLASTY Left 10/25/2020   Procedure: PERIPHERAL VASCULAR BALLOON ANGIOPLASTY;  Surgeon: Serafina Mitchell, MD;  Location: [place] [facility];  Service: Cardiovascular;  Laterality: Left;  anterior tibial and tibioperoneal trunk  . PERIPHERAL VASCULAR CATHETERIZATION N/A 09/07/2016   Procedure: Abdominal Aortogram w/Lower Extremity;  Surgeon: [name], MD;  Location: [place] CV [facility];  Service: Cardiovascular;  Laterality: N/A;  . RIGHT HEART CATH N/A 04/19/2017   Procedure: Right Heart Cath;  Surgeon: [name], MD;  Location: [place] [facility];  Service: Cardiovascular;  Laterality: N/A;  . RIGHT HEART CATHETERIZATION N/A 11/14/2012   Procedure: RIGHT HEART CATH;  Surgeon: [name], MD;  Location: [facility] CATH LAB;  Service: Cardiovascular;  Laterality: N/A;  . rt little toe removal  10/1998  . stent left thigh  3/11  . TENDON TRANSFER Right 10/24/2017   Procedure: transfer extensor indecus propius/extensor digitorum commomus index to middle ring and small;  Surgeon: Daryll Brod, MD;  Location: [place];  Service: Orthopedics;  Laterality: Right;  . TONSILLECTOMY    . TONSILLECTOMY AND ADENOIDECTOMY    . TUNNELED VENOUS CATHETER PLACEMENT  01/2013  . ULNAR HEAD  EXCISION Right 10/24/2017   Procedure: darrach right ulna;  Surgeon: Daryll Brod, MD;  Location: [place];  Service: Orthopedics;  Laterality: Right;  . VEIN LIGATION AND STRIPPING  05/16/2012   Procedure: VEIN LIGATION AND STRIPPING;  Surgeon: [name], MD;  Location: [facility] OR;  Service: Vascular;  Laterality: Right;  Irrigation and Debridement right lower leg, ligation of saphenous vein.    Family History  Problem Relation Age of Onset  . Lupus Father   . Lymphoma Father   . Hyperlipidemia Mother   . Cancer Daughter        sarcoma  . Colon cancer Other     Social History:  reports that she has never smoked. She has never used smokeless tobacco. She reports current alcohol use. She reports that she does not use drugs.  Allergies:  Allergies  Allergen Reactions  . Levofloxacin Other (See Comments)    FLU LIKE SYMPTOM   . Sulfa Antibiotics Rash and Other (See Comments)    Face peeled Severe rash with significant peeling of face. No airway involvement per patient   . Doxycycline Hyclate Swelling    SWELLING REACTION UNSPECIFIED  "TABLETS ONLY - CAPSULES ARE TOLERATED FINE"  . Penicillins Rash    Has patient had  a PCN reaction causing immediate rash, facial/tongue/throat swelling, SOB or lightheadedness with hypotension: no Has patient had a PCN reaction causing severe rash involving mucus membranes or skin necrosis: no Has patient had a PCN reaction that required hospitalization: no Has patient had a PCN reaction occurring within the last 10 years: {no If all of the above answers are "NO", then may proceed with Cephalosporin use.    Medications: I have reviewed the patient's current medications.  Results for orders placed or performed during the hospital encounter of 11/21/20 (from the past 48 hour(s))  CBC     Status: Abnormal   Collection Time: 11/21/20  4:03 PM  Result Value Ref Range   WBC 9.6 4.0 - 10.5 K/uL   RBC 1.53 (L) 3.87 - 5.11 MIL/uL   Hemoglobin 5.3 (LL) 12.0 -  15.0 g/dL    Comment: REPEATED TO VERIFY THIS CRITICAL RESULT HAS VERIFIED AND BEEN CALLED TO GARRISON,G. RN BY LAURA BILLINGSLEY ON 11 22 2021 AT 1635, AND HAS BEEN READ BACK. CRITICAL RESULT VERIFIED    HCT 17.3 (L) 36 - 46 %   MCV 113.1 (H) 80.0 - 100.0 fL   MCH 34.6 (H) 26.0 - 34.0 pg   MCHC 30.6 30.0 - 36.0 g/dL   RDW 18.0 (H) 11.5 - 15.5 %   Platelets 367 150 - 400 K/uL   nRBC 0.8 (H) 0.0 - 0.2 %    Comment: Performed at [facility], [place] [address] [place], [place] [address]  Type and screen     Status: None   Collection Time: 11/21/20  4:03 PM  Result Value Ref Range   ABO/RH(D) B POS    Antibody Screen NEG    Sample Expiration 11/24/2020,2359    Unit Number [id number]    Blood Component Type RED CELLS,LR    Unit division 00    Status of Unit ISSUED,FINAL    Transfusion Status OK TO TRANSFUSE    Crossmatch Result      Compatible Performed at [facility] [address] [place], [place] [address]    Unit Number [id number]    Blood Component Type RED CELLS,LR    Unit division 00    Status of Unit ISSUED,FINAL    Transfusion Status OK TO TRANSFUSE    Crossmatch Result Compatible   Protime-INR     Status: Abnormal   Collection Time: 11/21/20  4:03 PM  Result Value Ref Range   Prothrombin Time 21.4 (H) 11.4 - 15.2 seconds   INR 1.9 (H) 0.8 - 1.2    Comment: (NOTE) INR goal varies based on device and disease states. Performed at [facility], [place] [address], [place],  [address]   Vitamin B12     Status: None   Collection Time: 11/21/20  4:03 PM  Result Value Ref Range   Vitamin B-12 742 180 - 914 pg/mL    Comment: (NOTE) This assay is not validated for testing neonatal or myeloproliferative syndrome specimens for Vitamin B12 levels. Performed at [facility], [place] [address], [place],  [address]   Folate     Status: None   Collection Time: 11/21/20  4:03 PM   Result Value Ref Range   Folate 106.8 >5.9 ng/mL    Comment: RESULTS CONFIRMED BY MANUAL DILUTION Performed at [facility] [address], [place], [place] [address]   Iron and TIBC     Status: Abnormal   Collection Time: 11/21/20  4:03  PM  Result Value Ref Range   Iron 39 28 - 170 ug/dL   TIBC 376 250 - 450 ug/dL   Saturation Ratios 10 (L) 10.4 - 31.8 %   UIBC 337 ug/dL    Comment: Performed at [facility], [place] [address], [place], [place] [address]  Ferritin     Status: None   Collection Time: 11/21/20  4:03 PM  Result Value Ref Range   Ferritin 195 11 - 307 ng/mL    Comment: Performed at [facility], [place] [address], [place], [place] [address]  Reticulocytes     Status: Abnormal   Collection Time: 11/21/20  4:03 PM  Result Value Ref Range   Retic Ct Pct 10.5 (H) 0.4 - 3.1 %   RBC. 1.56 (L) 3.87 - 5.11 MIL/uL   Retic Count, Absolute 164.0 19.0 - 186.0 K/uL   Immature Retic Fract 40.0 (H) 2.3 - 15.9 %    Comment: Performed at [facility], [place] [address] [place], [place] [address]  Prepare RBC (crossmatch)     Status: None   Collection Time: 11/21/20  4:03 PM  Result Value Ref Range   Order Confirmation      ORDER PROCESSED BY BLOOD BANK Performed at [facility], [place] [address] [place], [place] [address]   POC occult blood, ED Provider will collect     Status: Abnormal   Collection Time: 11/21/20  4:03 PM  Result Value Ref Range   Fecal Occult Bld POSITIVE (A) NEGATIVE  Resp Panel by RT-PCR (Flu A&B, Covid) Nasopharyngeal Swab     Status: None   Collection Time: 11/21/20  4:14 PM   Specimen: Nasopharyngeal Swab; Nasopharyngeal(NP) swabs in vial transport medium  Result Value Ref Range   SARS Coronavirus 2 by RT PCR NEGATIVE NEGATIVE    Comment: (NOTE) SARS-CoV-2 target nucleic acids are NOT DETECTED.  The SARS-CoV-2 RNA is generally detectable in upper  respiratory specimens during the acute phase of infection. The lowest concentration of SARS-CoV-2 viral copies this assay can detect is 138 copies/mL. A negative result does not preclude SARS-Cov-2 infection and should not be used as the sole basis for treatment or other patient management decisions. A negative result may occur with  improper specimen collection/handling, submission of specimen other than nasopharyngeal swab, presence of viral mutation(s) within the areas targeted by this assay, and inadequate number of viral copies(<138 copies/mL). A negative result must be combined with clinical observations, patient history, and epidemiological information. The expected result is Negative.  Fact Sheet for Patients:  EntrepreneurPulse.com.au  Fact Sheet for Healthcare Providers:  IncredibleEmployment.be  This test is no t yet approved or cleared by the [place] FDA and  has been authorized for detection and/or diagnosis of SARS-CoV-2 by FDA under an Emergency Use Authorization (EUA). This EUA will remain  in effect (meaning this test can be used) for the duration of the COVID-19 declaration under Section 564(b)(1) of the Act, 21 U.S.C.section 360bbb-3(b)(1), unless the authorization is terminated  or revoked sooner.       Influenza A by PCR NEGATIVE NEGATIVE   Influenza B by PCR NEGATIVE NEGATIVE    Comment: (NOTE) The Xpert Xpress SARS-CoV-2/FLU/RSV plus assay is intended as an aid in the diagnosis of influenza from Nasopharyngeal swab specimens and should not be used as a sole basis for treatment. Nasal washings and aspirates are unacceptable for Xpert Xpress SARS-CoV-2/FLU/RSV testing.  Fact Sheet for Patients: EntrepreneurPulse.com.au  Fact Sheet  for Healthcare Providers: IncredibleEmployment.be  This test is not yet approved or cleared by the [place] and has been authorized for  detection and/or diagnosis of SARS-CoV-2 by FDA under an Emergency Use Authorization (EUA). This EUA will remain in effect (meaning this test can be used) for the duration of the COVID-19 declaration under Section 564(b)(1) of the Act, 21 U.S.C. section 360bbb-3(b)(1), unless the authorization is terminated or revoked.  Performed at [facility], [place] [address], [place], [place] [address]   MRSA PCR Screening     Status: Abnormal   Collection Time: 11/21/20  5:50 PM   Specimen: Nasal Mucosa; Nasopharyngeal  Result Value Ref Range   MRSA by PCR POSITIVE (A) NEGATIVE    Comment:        The GeneXpert MRSA Assay (FDA approved for NASAL specimens only), is one component of a comprehensive MRSA colonization surveillance program. It is not intended to diagnose MRSA infection nor to guide or monitor treatment for MRSA infections. RESULT CALLED TO, READ BACK BY AND VERIFIED WITH: FIELDS,O. RN _0  11/21/20 BILLINGSLEY,L Performed at [facility], [place] [address] [place], [place] [address]   Hemoglobin and hematocrit, blood     Status: Abnormal   Collection Time: 11/22/20  3:10 AM  Result Value Ref Range   Hemoglobin 7.6 (L) 12.0 - 15.0 g/dL    Comment: REPEATED TO VERIFY POST TRANSFUSION SPECIMEN    HCT 24.3 (L) 36 - 46 %    Comment: Performed at [name], [place] [address] [place], [place] [address]  Basic metabolic panel     Status: Abnormal   Collection Time: 11/22/20  3:10 AM  Result Value Ref Range   Sodium 138 135 - 145 mmol/L   Potassium 3.7 3.5 - 5.1 mmol/L   Chloride 98 98 - 111 mmol/L   CO2 27 22 - 32 mmol/L   Glucose, Bld 90 70 - 99 mg/dL    Comment: Glucose reference range applies only to samples taken after fasting for at least 8 hours.   BUN 45 (H) 8 - 23 mg/dL   Creatinine, Ser 1.42 (H) 0.44 - 1.00 mg/dL   Calcium 8.6 (L) 8.9 - 10.3 mg/dL   GFR, Estimated 40 (L) >60 mL/min    Comment:  (NOTE) Calculated using the CKD-EPI Creatinine Equation (2021)    Anion gap 13 5 - 15    Comment: Performed at [facility], [place] [address], [place], [place] [address]  Magnesium     Status: None   Collection Time: 11/22/20  3:10 AM  Result Value Ref Range   Magnesium 1.9 1.7 - 2.4 mg/dL    Comment: Performed at [facility], [place] [address], [place], [place] [address]  Hemoglobin and hematocrit, blood     Status: Abnormal   Collection Time: 11/22/20 10:13 AM  Result Value Ref Range   Hemoglobin 8.0 (L) 12.0 - 15.0 g/dL   HCT 25.6 (L) 36 - 46 %    Comment: Performed at [facility], [place] [address] [place], [place] [address]   *Note: Due to a large number of results and/or encounters for the requested time period, some results have not been displayed. A complete set of results can be found in Results Review.    DG Chest 2 View  Result Date: 11/21/2020 CLINICAL DATA:  Shortness of breath EXAM: CHEST - 2 VIEW COMPARISON:  May 03, 2020 FINDINGS: Diffuse interstitial thickening throughout the  lungs is again noted. There is no consolidation. There is cardiomegaly with pulmonary venous hypertension. No adenopathy. There is aortic atherosclerosis. There is midthoracic dextroscoliosis with upper lumbar levoscoliosis. IMPRESSION: There is cardiac prominence with pulmonary vascular congestion superimposed on chronic interstitial lung disease. It is difficult to exclude mild superimposed interstitial edema. No consolidation or pleural effusions. No adenopathy. Aortic Atherosclerosis (ICD10-I70.0). Electronically Signed   By: Lowella Grip III M.D.   On: 11/21/2020 13:07   NM Pulmonary Perfusion  Result Date: 11/22/2020 CLINICAL DATA:  Respiratory failure and shortness of breath EXAM: NUCLEAR MEDICINE PERFUSION LUNG SCAN TECHNIQUE: Perfusion images were obtained in multiple projections after intravenous injection of radiopharmaceutical.  Ventilation scans intentionally deferred if perfusion scan and chest x-ray adequate for interpretation during COVID 19 epidemic. RADIOPHARMACEUTICALS:  3.7 mCi Tc-51mMAA IV COMPARISON:  05/04/2019 FINDINGS: Improved perfusion is noted bilaterally with resolution of previously seen subsegmental defects. No large segmental defect is identified to suggest pulmonary embolism. IMPRESSION: No evidence of pulmonary embolism. Electronically Signed   By: MInez CatalinaM.D.   On: 11/22/2020 10:03    Review of Systems negative except above Blood pressure (!) 134/48, pulse 76, temperature 98.2 F (36.8 C), temperature source Oral, resp. rate 15, height _0  (1.702 m), weight 54 kg, SpO2 98 %. Physical Exam vital signs stable afebrile no acute distress exam pertinent for abdomen being soft nontender BUN and creatinine are about the same she is not really iron deficient increase INR hemoglobin increased nicely with transfusion  Assessment/Plan: Guaiac positive anemia and patient on 2 blood thinners Plan: We will proceed with endoscopy and if nondiagnostic will place the capsule in her small bowel endoscopically tomorrow morning and we again discussed the capsule endoscopy and the risks and if no signs of bleeding okay with me to go home after the capsule endoscopy which takes 8 to 12 hours after its placed per patient request and have instructed them to call me next week if they have not heard back but the question of which blood thinner she should go home on probably needs to be worked out but clearly both Xarelto and Plavix is too much at this time  [facility] 11/22/2020, 3:11 PM

## 2020-11-22 NOTE — Consult Note (Addendum)
[place] Nurse Consult Note: Reason for Consult: Consult requested for bilat leg wounds.  Pt is followed by the outpatient wound care center and has dressings changed 3 times a week.  She is very well-informed regarding woundcare and states the wounds are slowly improving.  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  All are 100% yellow and moist, mod amt yellow-green drainage, no odor. Left outer foot .2X.2X.1cm, .3X.3X.2cm Right heel .3X.3X.2cm Right posterior leg 3.5X3.5X.3cm Right outer leg 1X.3X.2cm Right inner leg1X.8X.2cm Right inner leg 2X2X.2cm Dressing procedure/placement/frequency: Continue present plan of care as ordered by the outpatient wound care center. Changed dressings today as outlined below. Topical treatment orders provided for bedside nurses as follows: Change dressings as follows Q Tues/Thurs/Sat (OK FOR HUSBAND TO DO, IF HE WANTS TO PERFORM) Remove previous dressings by moistening with NS. Apply Santyl to wounds to BLE, then cover with 4X4, then apply kerlex and coban, beginning just behind toes to below knees.  Pt should return to the outpatient wound care center after discharge for follow-up. Please re-consult if further assistance is needed.  Thank-you,  Julien Girt MSN, [place], [place], [place], [place]

## 2020-11-22 NOTE — Progress Notes (Signed)
Initial Nutrition Assessment  DOCUMENTATION CODES:   Non-severe (moderate) malnutrition in context of chronic illness  INTERVENTION:  - will order Boost Breeze BID, each supplement provides 250 kcal and 9 grams of protein. - will order 30 ml Prosource Plus once/day, each supplement provides 100 kcal and 15 grams protein.  - will order 1 tablet multivitamin with minerals/day. - diet advancement as medically feasible.    NUTRITION DIAGNOSIS:   Moderate Malnutrition related to chronic illness as evidenced by moderate fat depletion, moderate muscle depletion.  GOAL:   Patient will meet greater than or equal to 90% of their needs  MONITOR:   PO intake, Supplement acceptance, Diet advancement, Labs, Weight trends  REASON FOR ASSESSMENT:   Malnutrition Screening Tool  ASSESSMENT:   69 y.o. female with medical history of pulmonary HTN, HTN, HLD, non-Hodgkins lymphoma, hypothyroidism, chronic leg wounds, scleroderma, PVD. She presented to the ED with 4-5 day hx of DOE and dry cough. She presented to Pulmonary clinic from which she was sent to the ED due to symptomatic anemia/GIB. IR consulted for IVC filter placement.  Patient has been NPO since admission. Able to talk with RN. Plan is for IVC today and then plan to allow CLD.   Patient laying in bed with no family/visitors present. Patient reports that on 11/23/19 she fell and broke her femur in 2 places and broke her L wrist. Around that time she had a very poor appetite and lack of desire to eat. This has since resolved. No discomfort with walking or sitting.  Appetite is at baseline. Her husband does the majority of the cooking.  Weight today is 119 lb and weight on 02/16/20 was 130 lb. This indicates 11 lb weight loss (8% body weight); not significant for time frame.   Patient is feeling very hungry today and is looking forward to being able to eat solid food.    Labs reviewed; BUN: 45 mg/dl, creatinine: 1.42 mg/dl, Ca: 8.6  mg/dl, GFR: 40 ml/min. Medications reviewed; 1 mg folvite/day, 80 mg IV lasix BID, 50 mcg oral synthroid/day, 40 mg IV protonix BID, 5 mg deltasone/day, 25 mg aldactone/day, 1000 mcg oral cyanocobalamin/day.     NUTRITION - FOCUSED PHYSICAL EXAM:    Most Recent Value  Orbital Region Mild depletion  Upper Arm Region Moderate depletion  Thoracic and Lumbar Region Moderate depletion  Buccal Region Moderate depletion  Temple Region Mild depletion  Clavicle Bone Region Moderate depletion  Clavicle and Acromion Bone Region Moderate depletion  Scapular Bone Region Unable to assess  Dorsal Hand Severe depletion  Patellar Region Moderate depletion  Anterior Thigh Region Moderate depletion  Posterior Calf Region Moderate depletion  Edema (RD Assessment) None  Hair Reviewed  Eyes Reviewed  Mouth Reviewed  Skin Reviewed  Nails Reviewed       Diet Order:   Diet Order            Diet clear liquid Room service appropriate? Yes; Fluid consistency: Thin  Diet effective now                 EDUCATION NEEDS:   No education needs have been identified at this time  Skin:  Skin Assessment: Skin Integrity Issues: Skin Integrity Issues:: Other (Comment) Other: BLE venous stasis ulcers  Last BM:  11/22  Height:   Ht Readings from Last 1 Encounters:  11/21/20 _0  (1.702 m)    Weight:   Wt Readings from Last 1 Encounters:  11/22/20 54 kg  Estimated Nutritional Needs:  Kcal:  1550-1750 kcal Protein:  70-80 grams Fluid:  >/= 1.6 L/day     Jarome Matin, MS, RD, LDN, CNSC Inpatient Clinical Dietitian RD pager # available in [facility]  After hours/weekend pager # available in [facility]

## 2020-11-22 NOTE — Progress Notes (Signed)
PROGRESS NOTE    [name] [name]  JDB:[id number] DOB: 17-Apr-1951 DOA: 11/21/2020 PCP: [name], MD    Chief Complaint  Patient presents with  . Abnormal Lab    Brief Narrative: [name] [name] is a 69 y.o. female with medical history significant of pulmonary hypertension, hypertension, hyperlipidemia, , NHL, Hypothyroidism, chronic leg wounds, scleroderma, peripheral vascular disease, recently diagnosed with right LE DVT, started on xarelto on 11/07/2020, presented to pulmonary clinic for dyspnea on exertion, dry cough since 4 to 5 days. . Stat labs drawn in the clinic showed a hemoglobin of 5.7, and she was sent to ED for evaluation of symptomatic anemia. She was referred to [facility] for admission for evaluation of symptomatic anemia / GI bleed.  [facility] consulted by EDP. Pt underwent prior colonoscopy and EGD by Dr Oletta Lamas in 2016 and 2017. She underwent 2 units of prbc transfusion and repeat hemoglobin is between 7 to 8.  Dr Watt Climes consulted, recommended clear liquid diet for today.   NM pulm perfusion scan is negative for PE. IR consulted to see if she needs IVC filter placement.  Pt seen and examined at bedside, requesting for ice chips and water.   Assessment & Plan:   Active Problems:   Peripheral arterial occlusive disease (HCC)   Essential hypertension   Hyperlipidemia   Pulmonary hypertension associated with systemic disorder (HCC)   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HCC)   Scleroderma (HCC)   Hypothyroidism   GI bleed   Dyspnea on exertion possibly from symptomatic anemia and fluid overload: S/p 2 units of prbc transfusion.  Repeat hemoglobin is between 7 to 8.  Continue with  IV PPI BID.  GI consulted for further evaluation.  Pt NPO for IVC filter placement, can resume clears after the procedure.  Dr Watt Climes will see the patient today.  Anemia panel shows adequate iron stores, folate and B12 Levels.    Mild Acute on Chronic diastolic heart failure/ Pulmonary Hypertension:    Improving, she reports feeling better, and has diuresed > 1600 ml overnight, and in addition to that, pt reports having urinary incontinence and accidents in the bed.  Recommend to continue with IV lasix 80 mg BID.  Continue with daily weights and strict intake and output.      Hypothyroidism:  Resume synthroid.    Essential hypertension;  BP parameters are slightly elevated, will add hydralazine prn.    Stage 3 B CKD; Creatinine appears to be at baseline.    Mild leukocytosis:  Resolved.  Probably reactive.     Pulmonary hypertension:  Resume sildenafil.     Scleroderma:  Resume prednisone 5 mg daily.    Chronic leg wounds from PVD:  Was on plavix , which is hold for GI bleed.  Follows up with Dr Dellia Nims.  Wound consult and santyl ointment ordered.     Acute DVT in the right distal vein and right popliteal vein: Was on xarelto from 11/02/20, which is held for GI bleed.  IR consulted for IVC filter placement.       DVT prophylaxis: scd's Code Status: full code.  Family Communication: none at bedside.  Disposition:   Status is: Inpatient  Remains inpatient appropriate because:Unsafe d/c plan and IV treatments appropriate due to intensity of illness or inability to take PO   Dispo: The patient is from: Home              Anticipated d/c is to: Home  Anticipated d/c date is: 1 day              Patient currently is not medically stable to d/c.       Consultants:   Gastroenterology.   Procedures: none.   Antimicrobials: none.    Subjective: No new complaints overnight.   Objective: Vitals:   11/22/20 0400 11/22/20 0500 11/22/20 0600 11/22/20 0800  BP: (!) 153/44 (!) 129/38 (!) 159/58   Pulse: 68 (!) 57 64   Resp: _0 Temp: 98.3 F (36.8 C)   98.4 F (36.9 C)  TempSrc: Oral   Oral  SpO2: 97% 98% 98%   Weight:  54 kg    Height:        Intake/Output Summary (Last 24 hours) at 11/22/2020 0850 Last data filed at  11/22/2020 0200 Gross per 24 hour  Intake 630 ml  Output 1600 ml  Net -970 ml   Filed Weights   11/21/20 1730 11/22/20 0500  Weight: 54.3 kg 54 kg    Examination:  General exam: Appears calm and comfortable  Respiratory system: Clear to auscultation. Respiratory effort normal. Cardiovascular system: S1 & S2 heard, RRR. No JVD, No pedal edema. Gastrointestinal system: Abdomen is nondistended, soft and nontender.  Normal bowel sounds heard. Central nervous system: Alert and oriented. No focal neurological deficits. Extremities: Symmetric 5 x 5 power. Skin: No rashes, lesions or ulcers Psychiatry:  Mood & affect appropriate.     Data Reviewed: I have personally reviewed following labs and imaging studies  CBC: Recent Labs  Lab 11/21/20 1249 11/21/20 1603 11/22/20 0310  WBC 10.6* 9.6  --   NEUTROABS 9.3*  --   --   HGB 5.7 Repeated and verified X2.* 5.3* 7.6*  HCT 17.5 Repeated and verified X2.* 17.3* 24.3*  MCV 105.9* 113.1*  --   PLT 386.0 367  --     Basic Metabolic Panel: Recent Labs  Lab 11/21/20 1249 11/22/20 0310  NA 134* 138  K 4.1 3.7  CL 94* 98  CO2 26 27  GLUCOSE 115* 90  BUN 52* 45*  CREATININE 1.57* 1.42*  CALCIUM 9.0 8.6*    GFR: Estimated Creatinine Clearance: 31 mL/min (A) (by C-G formula based on SCr of 1.42 mg/dL (H)).  Liver Function Tests: No results for input(s): AST, ALT, ALKPHOS, BILITOT, PROT, ALBUMIN in the last 168 hours.  CBG: No results for input(s): GLUCAP in the last 168 hours.   Recent Results (from the past 240 hour(s))  Resp Panel by RT-PCR (Flu A&B, Covid) Nasopharyngeal Swab     Status: None   Collection Time: 11/21/20  4:14 PM   Specimen: Nasopharyngeal Swab; Nasopharyngeal(NP) swabs in vial transport medium  Result Value Ref Range Status   SARS Coronavirus 2 by RT PCR NEGATIVE NEGATIVE Final    Comment: (NOTE) SARS-CoV-2 target nucleic acids are NOT DETECTED.  The SARS-CoV-2 RNA is generally detectable in upper  respiratory specimens during the acute phase of infection. The lowest concentration of SARS-CoV-2 viral copies this assay can detect is 138 copies/mL. A negative result does not preclude SARS-Cov-2 infection and should not be used as the sole basis for treatment or other patient management decisions. A negative result may occur with  improper specimen collection/handling, submission of specimen other than nasopharyngeal swab, presence of viral mutation(s) within the areas targeted by this assay, and inadequate number of viral copies(<138 copies/mL). A negative result must be combined with clinical observations, patient history, and epidemiological information. The  expected result is Negative.  Fact Sheet for Patients:  EntrepreneurPulse.com.au  Fact Sheet for Healthcare Providers:  IncredibleEmployment.be  This test is no t yet approved or cleared by the [place] FDA and  has been authorized for detection and/or diagnosis of SARS-CoV-2 by FDA under an Emergency Use Authorization (EUA). This EUA will remain  in effect (meaning this test can be used) for the duration of the COVID-19 declaration under Section 564(b)(1) of the Act, 21 U.S.C.section 360bbb-3(b)(1), unless the authorization is terminated  or revoked sooner.       Influenza A by PCR NEGATIVE NEGATIVE Final   Influenza B by PCR NEGATIVE NEGATIVE Final    Comment: (NOTE) The Xpert Xpress SARS-CoV-2/FLU/RSV plus assay is intended as an aid in the diagnosis of influenza from Nasopharyngeal swab specimens and should not be used as a sole basis for treatment. Nasal washings and aspirates are unacceptable for Xpert Xpress SARS-CoV-2/FLU/RSV testing.  Fact Sheet for Patients: EntrepreneurPulse.com.au  Fact Sheet for Healthcare Providers: IncredibleEmployment.be  This test is not yet approved or cleared by the [place] FDA and has been  authorized for detection and/or diagnosis of SARS-CoV-2 by FDA under an Emergency Use Authorization (EUA). This EUA will remain in effect (meaning this test can be used) for the duration of the COVID-19 declaration under Section 564(b)(1) of the Act, 21 U.S.C. section 360bbb-3(b)(1), unless the authorization is terminated or revoked.  Performed at [facility], [place] [address], [place], [place] [address]   MRSA PCR Screening     Status: Abnormal   Collection Time: 11/21/20  5:50 PM   Specimen: Nasal Mucosa; Nasopharyngeal  Result Value Ref Range Status   MRSA by PCR POSITIVE (A) NEGATIVE Final    Comment:        The GeneXpert MRSA Assay (FDA approved for NASAL specimens only), is one component of a comprehensive MRSA colonization surveillance program. It is not intended to diagnose MRSA infection nor to guide or monitor treatment for MRSA infections. RESULT CALLED TO, READ BACK BY AND VERIFIED WITH: FIELDS,O. RN _0  11/21/20 BILLINGSLEY,L Performed at [facility], [place] [address], [place], [place] [address]          Radiology Studies: DG Chest 2 View  Result Date: 11/21/2020 CLINICAL DATA:  Shortness of breath EXAM: CHEST - 2 VIEW COMPARISON:  May 03, 2020 FINDINGS: Diffuse interstitial thickening throughout the lungs is again noted. There is no consolidation. There is cardiomegaly with pulmonary venous hypertension. No adenopathy. There is aortic atherosclerosis. There is midthoracic dextroscoliosis with upper lumbar levoscoliosis. IMPRESSION: There is cardiac prominence with pulmonary vascular congestion superimposed on chronic interstitial lung disease. It is difficult to exclude mild superimposed interstitial edema. No consolidation or pleural effusions. No adenopathy. Aortic Atherosclerosis (ICD10-I70.0). Electronically Signed   By: Lowella Grip III M.D.   On: 11/21/2020 13:07        Scheduled Meds: . Chlorhexidine  Gluconate Cloth  6 each Topical Daily  . citalopram  20 mg Oral QHS  . collagenase   Topical QHS  . cycloSPORINE  1 drop Both Eyes BID  . folic acid  1 mg Oral Daily  . furosemide  80 mg Intravenous Q12H  . levothyroxine  50 mcg Oral Q0600  . mupirocin ointment  1 application Nasal BID  . pantoprazole (PROTONIX) IV  40 mg Intravenous Q12H  . predniSONE  5 mg Oral QAC breakfast  . sildenafil  60 mg Oral TID  . spironolactone  25 mg  Oral Daily  . vitamin B-12  1,000 mcg Oral Daily   Continuous Infusions:   LOS: 1 day        Hosie Poisson, MD [facility]   To contact the attending provider between 7A-7P or the covering provider during after hours 7P-7A, please log into the web site [email or web address] and access using universal [place] password for that web site. If you do not have the password, please call the hospital operator.  11/22/2020, 8:50 AM

## 2020-11-22 NOTE — Procedures (Signed)
Interventional Radiology Procedure Note  Procedure: Placement of IVC filter  Complications: None  Estimated Blood Loss: None  Recommendations: - Bedrest with HOB elevated x 2 hrs   Signed,  Criselda Peaches, MD

## 2020-11-22 NOTE — Progress Notes (Signed)
Patient transported to IR holding. Katharine Look, RN in holding area. Patient stable on 3L [place].

## 2020-11-23 ENCOUNTER — Inpatient Hospital Stay (HOSPITAL_COMMUNITY): Payer: Medicare Other | Admitting: Anesthesiology

## 2020-11-23 ENCOUNTER — Encounter (HOSPITAL_COMMUNITY)
Admission: EM | Disposition: A | Discharge status: Home / Self Care | Payer: Self-pay | Source: Home / Self Care | Attending: Internal Medicine

## 2020-11-23 ENCOUNTER — Encounter (HOSPITAL_COMMUNITY): Payer: Self-pay | Admitting: Internal Medicine

## 2020-11-23 DIAGNOSIS — K922 Gastrointestinal hemorrhage, unspecified: Secondary | ICD-10-CM | POA: Diagnosis not present

## 2020-11-23 HISTORY — PX: ESOPHAGOGASTRODUODENOSCOPY: SHX5428

## 2020-11-23 HISTORY — PX: GIVENS CAPSULE STUDY: SHX5432

## 2020-11-23 LAB — HEMOGLOBIN AND HEMATOCRIT, BLOOD
HCT: 24.9 % — ABNORMAL LOW (ref 36.0–46.0)
HCT: 26 % — ABNORMAL LOW (ref 36.0–46.0)
HCT: 24.3 % — ABNORMAL LOW (ref 36.0–46.0)
Hemoglobin: 7.7 g/dL — ABNORMAL LOW (ref 12.0–15.0)
Hemoglobin: 8.1 g/dL — ABNORMAL LOW (ref 12.0–15.0)
Hemoglobin: 7.6 g/dL — ABNORMAL LOW (ref 12.0–15.0)

## 2020-11-23 SURGERY — EGD (ESOPHAGOGASTRODUODENOSCOPY)
Anesthesia: Monitor Anesthesia Care

## 2020-11-23 MED ORDER — NYSTATIN 100000 UNIT/ML MT SUSP: 5.0000 mL | Freq: Four times a day (QID) | OROMUCOSAL | 0 refills | Status: AC

## 2020-11-23 MED ORDER — PROPOFOL 500 MG/50ML IV EMUL
INTRAVENOUS | Status: DC | PRN
  Administered 2020-11-23: 75 ug/kg/min via INTRAVENOUS

## 2020-11-23 MED ORDER — NYSTATIN 100000 UNIT/ML MT SUSP
5.0000 mL | Freq: Four times a day (QID) | OROMUCOSAL | Status: DC
  Administered 2020-11-23 (×3): 500000 [IU] via ORAL
  Filled 2020-11-23 (×3): qty 5

## 2020-11-23 MED ORDER — LACTATED RINGERS IV SOLN
INTRAVENOUS | Status: AC | PRN
  Administered 2020-11-23: 10 mL/h via INTRAVENOUS

## 2020-11-23 MED ORDER — PHENYLEPHRINE 40 MCG/ML (10ML) SYRINGE FOR IV PUSH (FOR BLOOD PRESSURE SUPPORT)
PREFILLED_SYRINGE | INTRAVENOUS | Status: DC | PRN
  Administered 2020-11-23: 80 ug via INTRAVENOUS

## 2020-11-23 MED ORDER — OXYCODONE HCL 5 MG PO TABS: 5.0000 mg | ORAL_TABLET | Freq: Two times a day (BID) | ORAL | 0 refills | Status: AC | PRN

## 2020-11-23 MED ORDER — OXYCODONE-ACETAMINOPHEN 5-325 MG PO TABS
1.0000 | ORAL_TABLET | Freq: Once | ORAL | Status: AC
  Administered 2020-11-23: 1 via ORAL
  Filled 2020-11-23: qty 1

## 2020-11-23 MED ORDER — OXYCODONE HCL 5 MG PO TABS
5.0000 mg | ORAL_TABLET | Freq: Four times a day (QID) | ORAL | Status: DC | PRN
  Administered 2020-11-23 (×2): 5 mg via ORAL
  Filled 2020-11-23 (×2): qty 1

## 2020-11-23 MED ORDER — LACTATED RINGERS IV SOLN: INTRAVENOUS | Status: DC

## 2020-11-23 MED ORDER — LIDOCAINE 2% (20 MG/ML) 5 ML SYRINGE
INTRAMUSCULAR | Status: DC | PRN
  Administered 2020-11-23: 100 mg via INTRAVENOUS

## 2020-11-23 MED ORDER — PROPOFOL 500 MG/50ML IV EMUL
INTRAVENOUS | Status: AC
  Filled 2020-11-23: qty 50

## 2020-11-23 MED ORDER — PROPOFOL 10 MG/ML IV BOLUS
INTRAVENOUS | Status: DC | PRN
  Administered 2020-11-23 (×5): 20 mg via INTRAVENOUS

## 2020-11-23 SURGICAL SUPPLY — 1 items: TOWEL COTTON PACK 4EA (MISCELLANEOUS) ×4 IMPLANT

## 2020-11-23 NOTE — Plan of Care (Signed)
  Problem: Education: Goal: Knowledge of General Education information will improve Description: Including pain rating scale, medication(s)/side effects and non-pharmacologic comfort measures Outcome: Progressing   Problem: Clinical Measurements: Goal: Cardiovascular complication will be avoided Outcome: Progressing   Problem: Coping: Goal: Level of anxiety will decrease Outcome: Progressing   Problem: Pain Managment: Goal: General experience of comfort will improve Outcome: Not Progressing

## 2020-11-23 NOTE — Op Note (Signed)
[facility] Patient Name: [name] [name] Procedure Date: 11/23/2020 MRN: [id number] Attending MD: [name] , MD Date of Birth: 07-24-1951 CSN: [id number] Age: 69 Admit Type: Inpatient Procedure:                Upper GI endoscopy Indications:              Iron deficiency anemia secondary to chronic blood                            loss difficulty swallowing capsule endoscopy in the                            past Providers:                [name], MD, Jeanella Cara, RN, Laverda Sorenson, Technician, Benetta Spar, Technician,                            Danley Danker, CRNA Referring MD:              Medicines:                Propofol total dose 170 mg IV, 100 mg IV lidocaine Complications:            No immediate complications. Estimated Blood Loss:     Estimated blood loss: none. Procedure:                Pre-Anesthesia Assessment:                           - Prior to the procedure, a History and Physical                            was performed, and patient medications and                            allergies were reviewed. The patient's tolerance of                            previous anesthesia was also reviewed. The risks                            and benefits of the procedure and the sedation                            options and risks were discussed with the patient.                            All questions were answered, and informed consent                            was obtained. Prior Anticoagulants: The patient has  taken Xarelto (rivaroxaban), last dose was 2 days                            prior to procedure. ASA Grade Assessment: III - A                            patient with severe systemic disease. After                            reviewing the risks and benefits, the patient was                            deemed in satisfactory condition to undergo the                             procedure.                           After obtaining informed consent, the endoscope was                            passed under direct vision. Throughout the                            procedure, the patient's blood pressure, pulse, and                            oxygen saturations were monitored continuously. The                            [id number]-H190 ([id number]) Olympus gastroscope was                            introduced through the mouth, and advanced to the                            fourth part of duodenum. The upper GI endoscopy was                            accomplished without difficulty. The patient                            tolerated the procedure well. Scope In: Scope Out: Findings:      The larynx was normal.      Moderately severe esophagitis was found. It was probably a combination       of both food particles reflux and Candida      Localized mild inflammation characterized by erythema was found in the       cardia, in the gastric fundus and in the proximal gastric body.      The duodenal bulb, first portion of the duodenum, second portion of the       duodenum, third portion of the duodenum and fourth portion of the       duodenum were normal.      Using the endoscope, the  video capsule enteroscope was advanced into the       duodenal bulb.      The exam was otherwise without abnormality. Impression:               - Normal larynx.                           - Moderately severe reflux, candidiasis and erosive                            esophagitis. Some of the food debris was washed and                            suctioned into the stomach                           - Chronic proximal gastritis.                           - Normal duodenal bulb, first portion of the                            duodenum, second portion of the duodenum, third                            portion of the duodenum and fourth portion of the                            duodenum.                            - The examination was otherwise normal.                           - Successful completion of the Video Capsule                            Enteroscope placement.                           - No specimens collected. Moderate Sedation:      Not Applicable - Patient had care per Anesthesia. Recommendation:           - Advance diet as tolerated today as per capsule                            endoscopy orders and routine.                           - Continue present medications. Reevaluate blood                            thinner needs use nystatin swish and swallow four 1                            week and keep upright while eating for 2  hours and                            ovaries finish with liquids to wash esophagus                           - Return to GI clinic PRN.                           - Telephone GI clinic for study results in 1 week.                            Please call my rounding partner this holiday week                            if any further questions or problems from our                            standpoint Procedure Code(s):        --- Professional ---                           [phone], Esophagogastroduodenoscopy, flexible,                            transoral; diagnostic, including collection of                            specimen(s) by brushing or washing, when performed                            (separate procedure) Diagnosis Code(s):        --- Professional ---                           K21.00, Gastro-esophageal reflux disease with                            esophagitis, without bleeding                           B37.81, Candidal esophagitis                           K20.80, Other esophagitis without bleeding                           K29.50, Unspecified chronic gastritis without                            bleeding                           D50.0, Iron deficiency anemia secondary to blood                            loss (chronic) CPT copyright 2019 American  Medical Association. All  rights reserved. The codes documented in this report are preliminary and upon coder review may  be revised to meet current compliance requirements. [name], MD 11/23/2020 8:10:08 AM This report has been signed electronically. Number of Addenda: 0

## 2020-11-23 NOTE — Progress Notes (Signed)
PT Cancellation Note  Patient Details Name: [name] [name] MRN: [id number] DOB: 11-12-1951   Cancelled Treatment:    Reason Eval/Treat Not Completed: PT screened, no needs identified, will sign off;Patient declined,  Patient reports right leg is painful and would rather not get up to ambulate. Patient has a WC and DME for home. Spouse present and concurs that they have needs addressed for home.   Claretha Cooper 11/23/2020, 2:11 PM  [place] Pager [phone] Office [phone]

## 2020-11-23 NOTE — Progress Notes (Signed)
[name] [name] 7:34 AM  Subjective: Patient without signs of bleeding and no GI complaints and we rediscussed the procedure  Objective: Vital signs stable afebrile no acute distress exam please see preassessment evaluation hemoglobin stable  Assessment: GI blood loss and patient on 2 blood thinners with a history of GI blood loss on aspirin alone  Plan: Okay to proceed with endoscopy and possible capsule endoscopy with anesthesia assistance  [facility] E  office [phone] After 5PM or if no answer call [phone]

## 2020-11-23 NOTE — Discharge Summary (Signed)
Physician Discharge Summary  [name] [name] XBJ:[id number] DOB: 1951/04/17 DOA: 11/21/2020  PCP: [name], MD  Admit date: 11/21/2020 Discharge date: 11/23/2020  Admitted From: Home Disposition: Home  Recommendations for Outpatient Follow-up:  1. Follow up with PCP in 1-2 weeks 2. Check hemoglobin in 1 week.  Home Health: Not applicable Equipment/Devices: Not applicable  Discharge Condition: Stable CODE STATUS: Full code Diet recommendation: Regular diet and reflux precautions.  Discharge summary: 69 year old female with extensive medical problems including history of pulmonary hypertension, hyperlipidemia, non-Hodgkin's lymphoma, hypothyroidism, chronic leg wounds and peripheral vascular disease status post multiple stenting, recently diagnosed LE DVT on xarelto presented to pulmonary clinic for dyspnea on exertion, dry cough for 4 to 5 days.  Hemoglobin was found to be 5.7 and sent to ER.  In the emergency room, hemodynamically stable.  Nuclear medicine perfusion scan negative for PE.  Symptomatic anemia: Hemoglobin 5.7-2 units of PRBC given-hemoglobin 7.8, 8.  No active bleeding. Underwent upper GI endoscopy, found to have severe esophagitis, suspected candidal esophagitis and biopsies pending.  No active bleeding. Capsule endoscopy was placed for small bowel morphology, patient will be going home with capsule endoscopy.  GI office to schedule follow-up to discuss results. Patient is on Plavix for her severe peripheral vascular disease, recent multiple stents.  She was recently started on Xarelto. Nystatin swish and swallow 4 times a day for 7 days PPI 40 mg twice daily to continue. GI will call with biopsy results. Reflux precautions discussed and instructions given. Patient unable to tolerate combination of multiple anticoagulation, received IVC filter by interventional radiology on 11/23, will discontinue Xarelto.  Lung scan was negative for PE. She will need antiplatelet  therapy, she will continue Plavix.  Other chronic medical issues including pulmonary hypertension, chronic diastolic heart failure, hypothyroidism and hypertension remained stable.  Creatinine at baseline.  She is also on chronic maintenance prednisone therapy that she will continue.  Patient has severe pain on her legs due to chronic wounds.  She follows up at wound care clinic next week.  Due to significant pain, patient requested a short course of opiate therapy.  This was prescribed for 5 days.  She will use Tylenol extra strength for pain.    Discharge Diagnoses:  Active Problems:   Peripheral arterial occlusive disease (HCC)   Essential hypertension   Hyperlipidemia   Pulmonary hypertension associated with systemic disorder (HCC)   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HCC)   Scleroderma (HCC)   Hypothyroidism   GI bleed   Malnutrition of moderate degree    Discharge Instructions  Discharge Instructions    Diet - low sodium heart healthy   Complete by: As directed    Discharge instructions   Complete by: As directed    Do not take Xarelto Resume taking Plavix on 11/25.  Schedule follow-up with your vascular surgery and wound care clinic.   Discharge wound care:   Complete by: As directed    Remove previous dressings by moistening with NS. Apply Santyl to wounds to BLE, then cover with 4X4, then apply kerlex and coban, beginning just behind toes to below knees.   Increase activity slowly   Complete by: As directed      Allergies as of 11/23/2020      Reactions   Levofloxacin Other (See Comments)   FLU LIKE SYMPTOM   Sulfa Antibiotics Rash, Other (See Comments)   Face peeled Severe rash with significant peeling of face. No airway involvement per patient   Doxycycline Hyclate Swelling  SWELLING REACTION UNSPECIFIED  "TABLETS ONLY - CAPSULES ARE TOLERATED FINE"   Penicillins Rash   Has patient had a PCN reaction causing immediate rash, facial/tongue/throat swelling, SOB or  lightheadedness with hypotension: no Has patient had a PCN reaction causing severe rash involving mucus membranes or skin necrosis: no Has patient had a PCN reaction that required hospitalization: no Has patient had a PCN reaction occurring within the last 10 years: {no If all of the above answers are "NO", then may proceed with Cephalosporin use.      Medication List    STOP taking these medications   magnesium oxide 400 (241.3 Mg) MG tablet Commonly known as: MAG-OX   Rivaroxaban Stater Pack (15 mg and 20 mg) Commonly known as: XARELTO STARTER PACK     TAKE these medications   acetaminophen 650 MG CR tablet Commonly known as: TYLENOL Take 1,300 mg by mouth every 8 (eight) hours as needed for pain.   Biotin 5000 MCG Tabs Take 5,000 mcg by mouth daily.   CALCIUM 1200 PO Take 1,200 mg by mouth daily.   citalopram 20 MG tablet Commonly known as: CELEXA TAKE 1 TABLET BY MOUTH EVERYDAY AT BEDTIME What changed: See the new instructions.   clopidogrel 75 MG tablet Commonly known as: PLAVIX TAKE 1 TABLET BY MOUTH EVERY DAY   doxycycline 100 MG capsule Commonly known as: MONODOX Take 100 mg by mouth 2 (two) times daily. Start date : 11/17/20   Ferrex 150 150 MG capsule Generic drug: iron polysaccharides TAKE 1 CAPSULE BY MOUTH TWICE A DAY What changed: how much to take   fish oil-omega-3 fatty acids 1000 MG capsule Take 1,000 mg by mouth 2 (two) times daily.   folic acid 1 MG tablet Commonly known as: FOLVITE TAKE 1 TABLET BY MOUTH EVERY DAY   levothyroxine 50 MCG tablet Commonly known as: SYNTHROID TAKE 1 TABLET BY MOUTH DAILY BEFORE BREAKFAST What changed: See the new instructions.   loperamide 2 MG capsule Commonly known as: IMODIUM Take 2 mg by mouth daily as needed for diarrhea or loose stools.   MAGNESIUM ASPARTATE PO Take 1 tablet by mouth in the morning and at bedtime.   multivitamin with minerals Tabs tablet Take 1 tablet by mouth daily.   nystatin  100000 UNIT/ML suspension Commonly known as: MYCOSTATIN Take 5 mLs (500,000 Units total) by mouth 4 (four) times daily for 7 days.   Opsumit 10 MG tablet Generic drug: macitentan Take 1 tablet (26m) by mouth daily. What changed:   how much to take  how to take this  when to take this  additional instructions   oxyCODONE 5 MG immediate release tablet Commonly known as: Oxy IR/ROXICODONE Take 1 tablet (5 mg total) by mouth every 12 (twelve) hours as needed for up to 5 days for moderate pain or severe pain.   OXYGEN Inhale 2-3 L into the lungs continuous.   pantoprazole 40 MG tablet Commonly known as: PROTONIX Take 1 tablet (40 mg total) by mouth 2 (two) times daily.   potassium chloride 20 MEQ/15ML (10%) Soln TAKE 2 TEASPOONFUL (DILUTED IN WATER) ONCE DAILY What changed: See the new instructions.   predniSONE 5 MG tablet Commonly known as: DELTASONE Take 5 mg by mouth daily.   Restasis 0.05 % ophthalmic emulsion Generic drug: cycloSPORINE Place 2 drops into both eyes 2 (two) times daily.   rosuvastatin 5 MG tablet Commonly known as: CRESTOR Take 1 tablet every Monday, Wednesday, and Friday. What changed:   how  much to take  how to take this  when to take this  additional instructions   Santyl ointment Generic drug: collagenase Apply 1 application topically every other day.   sildenafil 20 MG tablet Commonly known as: REVATIO TAKE 3 TABLETS (60 MG TOTAL) BY MOUTH 3 (THREE) TIMES DAILY. PLEASE CALL FOR OFFICE VISIT [phone] What changed: additional instructions   spironolactone 25 MG tablet Commonly known as: ALDACTONE TAKE 1 TABLET BY MOUTH EVERY DAY   torsemide 20 MG tablet Commonly known as: DEMADEX Take 40 mg by mouth See admin instructions. Takes 40 mg in the morning and 20 mg at night   valACYclovir 500 MG tablet Commonly known as: Valtrex Take 1 tablet (500 mg total) by mouth 2 (two) times daily. What changed:   when to take  this  reasons to take this   vitamin B-12 1000 MCG tablet Commonly known as: CYANOCOBALAMIN Take 1,000 mcg by mouth daily.   Vitamin D 50 MCG (2000 UT) Caps Take 2,000 Units by mouth daily.            Discharge Care Instructions  (From admission, onward)         Start     Ordered   11/23/20 0000  Discharge wound care:       Comments: Remove previous dressings by moistening with NS. Apply Santyl to wounds to BLE, then cover with 4X4, then apply kerlex and coban, beginning just behind toes to below knees.   11/23/20 1245          Follow-up Information    Baxley, Cresenciano Lick, MD Follow up in 2 week(s).   Specialty: Internal Medicine Contact information: [phone] [id number] [phone]              Allergies  Allergen Reactions  . Levofloxacin Other (See Comments)    FLU LIKE SYMPTOM   . Sulfa Antibiotics Rash and Other (See Comments)    Face peeled Severe rash with significant peeling of face. No airway involvement per patient   . Doxycycline Hyclate Swelling    SWELLING REACTION UNSPECIFIED  "TABLETS ONLY - CAPSULES ARE TOLERATED FINE"  . Penicillins Rash    Has patient had a PCN reaction causing immediate rash, facial/tongue/throat swelling, SOB or lightheadedness with hypotension: no Has patient had a PCN reaction causing severe rash involving mucus membranes or skin necrosis: no Has patient had a PCN reaction that required hospitalization: no Has patient had a PCN reaction occurring within the last 10 years: {no If all of the above answers are "NO", then may proceed with Cephalosporin use.    Consultations:  Gastroenterology  Interventional radiology, IVC filter on 11/23   Procedures/Studies: DG Chest 2 View  Result Date: 11/21/2020 CLINICAL DATA:  Shortness of breath EXAM: CHEST - 2 VIEW COMPARISON:  May 03, 2020 FINDINGS: Diffuse interstitial thickening throughout the lungs is again noted. There is no consolidation.  There is cardiomegaly with pulmonary venous hypertension. No adenopathy. There is aortic atherosclerosis. There is midthoracic dextroscoliosis with upper lumbar levoscoliosis. IMPRESSION: There is cardiac prominence with pulmonary vascular congestion superimposed on chronic interstitial lung disease. It is difficult to exclude mild superimposed interstitial edema. No consolidation or pleural effusions. No adenopathy. Aortic Atherosclerosis (ICD10-I70.0). Electronically Signed   By: Lowella Grip III M.D.   On: 11/21/2020 13:07   NM Pulmonary Perfusion  Result Date: 11/22/2020 CLINICAL DATA:  Respiratory failure and shortness of breath EXAM: NUCLEAR MEDICINE PERFUSION LUNG SCAN TECHNIQUE: Perfusion images were obtained  in multiple projections after intravenous injection of radiopharmaceutical. Ventilation scans intentionally deferred if perfusion scan and chest x-ray adequate for interpretation during COVID 19 epidemic. RADIOPHARMACEUTICALS:  3.7 mCi Tc-42mMAA IV COMPARISON:  05/04/2019 FINDINGS: Improved perfusion is noted bilaterally with resolution of previously seen subsegmental defects. No large segmental defect is identified to suggest pulmonary embolism. IMPRESSION: No evidence of pulmonary embolism. Electronically Signed   By: MInez CatalinaM.D.   On: 11/22/2020 10:03   IR IVC FILTER PLMT / S&I /Burke KeelsGUID/MOD SED  Result Date: 11/22/2020 INDICATION: 69year old female with acute lower extremity DVT on Xarelto. Unfortunately, she presents with significant anemia and heme-positive stools. Anticoagulation is now contraindicated and she presents for IVC filter placement. EXAM: ULTRASOUND GUIDANCE FOR VASCULARACCESS IVC CATHETERIZATION AND VENOGRAM IVC FILTER INSERTION Interventional Radiologist:  HCriselda Peaches MD MEDICATIONS: None. ANESTHESIA/SEDATION: Fentanyl 25 mcg IV; Versed 0.5 mg IV Moderate Sedation Time:  11 minutes The patient was continuously monitored during the procedure by the  interventional radiology nurse under my direct supervision. FLUOROSCOPY TIME:  Fluoroscopy Time: 0 minutes 24 seconds (16 mGy). COMPLICATIONS: None immediate. PROCEDURE: Informed written consent was obtained from the patient after a thorough discussion of the procedural risks, benefits and alternatives. All questions were addressed. Maximal Sterile Barrier Technique was utilized including caps, mask, sterile gowns, sterile gloves, sterile drape, hand hygiene and skin antiseptic. A timeout was performed prior to the initiation of the procedure. Maximal barrier sterile technique utilized including caps, mask, sterile gowns, sterile gloves, large sterile drape, hand hygiene, and Betadine prep. Under sterile condition and local anesthesia, right internal jugular venous access was performed with ultrasound. An ultrasound image was saved and sent to PACS. Over a guidewire, the IVC filter delivery sheath and inner dilator were advanced into the IVC just above the IVC bifurcation. Contrast injection was performed for an IVC venogram. Through the delivery sheath, a retrievable Denali IVC filter was deployed below the level of the renal veins and above the IVC bifurcation. Limited post deployment venacavagram was performed. The delivery sheath was removed and hemostasis was obtained with manual compression. A dressing was placed. The patient tolerated the procedure well without immediate post procedural complication. FINDINGS: The IVC is patent. No evidence of thrombus, stenosis, or occlusion. No variant venous anatomy. Successful placement of the IVC filter below the level of the renal veins. IMPRESSION: Successful ultrasound and fluoroscopically guided placement of an infrarenal retrievable IVC filter via right jugular approach. PLAN: Due to patient related comorbidities and/or clinical necessity, this IVC filter should be considered a permanent device. This patient will not be actively followed for future filter retrieval.  Electronically Signed   By: HJacqulynn CadetM.D.   On: 11/22/2020 17:17   PERIPHERAL VASCULAR CATHETERIZATION  Result Date: 10/25/2020 Patient name: JKIANDRIA CLUMMRN: [id number]: [birth year]Sex: female 10/25/2020 Pre-operative Diagnosis: Bilateral lower extremity ulcers Post-operative diagnosis:  Same Surgeon:  WAnnamarie MajorProcedure Performed:  1.  Ultrasound-guided access, right femoral artery  2.  Angioplasty, left anterior tibial artery  3.  Angioplasty, left tibioperoneal trunk  4.  Conscious sedation, 46 minutes Indications: The patient has previously undergone percutaneous intervention of the right leg.  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  She is back today for intervention. Procedure:  The patient was identified in the holding area and taken to room 8.  The patient was then placed supine on the table and prepped and draped in  the usual sterile fashion.  A time out was called.  Conscious sedation was administered with the use of IV fentanyl and Versed under continuous physician and nurse monitoring.  Heart rate, blood pressure, and oxygen saturation were continuously monitored.  Total sedation time was 47 minutes.  Ultrasound was used to evaluate the right common femoral artery.  It was patent .  A digital ultrasound image was acquired.  A micropuncture needle was used to access the right common femoral artery under ultrasound guidance.  An 018 wire was advanced without resistance and a micropuncture sheath was placed.  The 018 wire was removed and a benson wire was placed.  The micropuncture sheath was exchanged for a 5 french sheath.  The aortic bifurcation was crossed and a Omni Flush cath was advanced into the left external iliac artery and left leg runoff was performed Findings:  Left Lower Extremity: The left common femoral profundofemoral artery are widely patent.  The superficial femoral artery is patent throughout its  course.  There is diffuse calcification with mild luminal narrowing.  The popliteal artery is patent throughout its course.  The dominant runoff vessel is anterior tibial artery.  Previous images in a different angle suggested 60 to 70% proximal anterior tibial artery stenosis.  There was also a greater than 70% stenosis at the origin of the tibioperoneal trunk.  The dominant vessel across the ankle is anterior tibial artery Intervention: At this point the decision was made to intervene.  A Rosen wire was inserted followed by placement of 5 French 45 cm Ansell 1 sheath.  The patient was fully heparinized.  I then used a V-18 wire with the support of a Sterling 3 x 40 balloon.  The anterior tibial artery was selected.  Balloon angioplasty was performed at the proximal portion of the anterior tibial artery at a atmospheres for 2 minutes.  Follow-up imaging revealed resolution of the stenosis however there did appear to be a more distal lesion at this time.  A second balloon inflation was performed and this resolved after 2 minutes.  Next attention was turned towards the tibioperoneal trunk lesion.  The peroneal artery was selected with the wire.  The 3 x 40 balloon was then inserted and balloon angioplasty was performed of the tibioperoneal trunk and the proximal peroneal artery.  Unfortunately the balloon ruptured following insufflation.  I removed the balloon and then performed angiography which showed resolution of the tibioperoneal trunk lesion despite suboptimal timing of angioplasty.  I elected to leave this alone.  Catheters and wires were removed.  The patient taken the holding of her sheath pull with her coagulation profile correct. 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  Both lesions were successfully treated with a 3 mm balloon. Theotis Burrow, M.D., [facility] [place] Office: [phone]  Pager:  [phone]  VAS [place] LOWER EXTREMITY VENOUS (DVT)  Result Date: 11/07/2020  Lower Venous DVT Study Indications: Pain, Swelling, and ulceration.  Limitations: Poor ultrasound/tissue interface.  Comparison Study: 12/16/2017: Rt- No evidence of deep vein thrombosis Performing Technologist: Ivan Croft  Examination Guidelines: A complete evaluation includes B-mode imaging, spectral Doppler, color Doppler, and power Doppler as needed of all accessible portions of each vessel. Bilateral testing is considered an integral part of a complete examination. Limited examinations for reoccurring indications may be performed as noted. The reflux portion of the exam is performed with the  patient in reverse Trendelenburg.  +---------+---------------+---------+-----------+----------+--------------+ RIGHT    CompressibilityPhasicitySpontaneityPropertiesThrombus Aging +---------+---------------+---------+-----------+----------+--------------+ CFV      Full           Yes      Yes                                 +---------+---------------+---------+-----------+----------+--------------+ SFJ      Full                                                        +---------+---------------+---------+-----------+----------+--------------+ FV Prox  Full                                                        +---------+---------------+---------+-----------+----------+--------------+ FV Mid   Full           Yes      Yes                                 +---------+---------------+---------+-----------+----------+--------------+ FV DistalPartial                                      Acute          +---------+---------------+---------+-----------+----------+--------------+ PFV      Full                                                        +---------+---------------+---------+-----------+----------+--------------+ POP      Partial        Yes      Yes                  Acute           +---------+---------------+---------+-----------+----------+--------------+ PTV      Full                                                        +---------+---------------+---------+-----------+----------+--------------+ PERO     Full                                                        +---------+---------------+---------+-----------+----------+--------------+ Gastroc  Full                                                        +---------+---------------+---------+-----------+----------+--------------+   +----+---------------+---------+-----------+----------+--------------+ LEFTCompressibilityPhasicitySpontaneityPropertiesThrombus Aging +----+---------------+---------+-----------+----------+--------------+  CFV Full           Yes      Yes                                 +----+---------------+---------+-----------+----------+--------------+     Summary:  RIGHT: - Findings consistent with acute deep vein thrombosis involving the right distal femoral vein, and right popliteal vein. - No cystic structure found in the popliteal fossa.  - Results were called to Dr. Dellia Nims and given to Dr. Trula Slade per patient request (11/07/2020).  LEFT: - No evidence of common femoral vein obstruction.  *See table(s) above for measurements and observations. Electronically signed by Harold Barban MD on 11/07/2020 at 4:10:18 PM.    Final    (Echo, Carotid, EGD, Colonoscopy, ERCP)    Subjective: Patient seen and examined.  Early morning she came back from endoscopy unit.  Complains of pain in the legs. Went back to examine patient with her husband at the bedside.  Results were discussed.  Eager to go home to prepare for Thanksgiving.  Denies any nausea vomiting.  Main problem is leg pain.    Discharge Exam: Vitals:   11/23/20 1130 11/23/20 1200  BP:  (!) 158/34  Pulse: 65 67  Resp: (!) 22 18  Temp:  98.8 F (37.1 C)  SpO2: 100% 100%   Vitals:   11/23/20 0930 11/23/20 1100 11/23/20 1130  11/23/20 1200  BP:  (!) 146/103  (!) 158/34  Pulse: 67 62 65 67  Resp: (!) 21 (!) 21 (!) 22 18  Temp:    98.8 F (37.1 C)  TempSrc:    Oral  SpO2: 99% 100% 100% 100%  Weight:      Height:        General: Pt is alert, awake, not in acute distress Patient has some deformity of the hands.  Looks comfortable on 2 to 3 L of oxygen.  She uses oxygen at home. Cardiovascular: RRR, S1/S2 +, no rubs, no gallops Respiratory: CTA bilaterally, no wheezing, no rhonchi Abdominal: Soft, NT, ND, bowel sounds + Extremities: no edema, no cyanosis    The results of significant diagnostics from this hospitalization (including imaging, microbiology, ancillary and laboratory) are listed below for reference.     Microbiology: Recent Results (from the past 240 hour(s))  Resp Panel by RT-PCR (Flu A&B, Covid) Nasopharyngeal Swab     Status: None   Collection Time: 11/21/20  4:14 PM   Specimen: Nasopharyngeal Swab; Nasopharyngeal(NP) swabs in vial transport medium  Result Value Ref Range Status   SARS Coronavirus 2 by RT PCR NEGATIVE NEGATIVE Final    Comment: (NOTE) SARS-CoV-2 target nucleic acids are NOT DETECTED.  The SARS-CoV-2 RNA is generally detectable in upper respiratory specimens during the acute phase of infection. The lowest concentration of SARS-CoV-2 viral copies this assay can detect is 138 copies/mL. A negative result does not preclude SARS-Cov-2 infection and should not be used as the sole basis for treatment or other patient management decisions. A negative result may occur with  improper specimen collection/handling, submission of specimen other than nasopharyngeal swab, presence of viral mutation(s) within the areas targeted by this assay, and inadequate number of viral copies(<138 copies/mL). A negative result must be combined with clinical observations, patient history, and epidemiological information. The expected result is Negative.  Fact Sheet for Patients:   EntrepreneurPulse.com.au  Fact Sheet for Healthcare Providers:  IncredibleEmployment.be  This test is no  t yet approved or cleared by the [place] and  has been authorized for detection and/or diagnosis of SARS-CoV-2 by FDA under an Emergency Use Authorization (EUA). This EUA will remain  in effect (meaning this test can be used) for the duration of the COVID-19 declaration under Section 564(b)(1) of the Act, 21 U.S.C.section 360bbb-3(b)(1), unless the authorization is terminated  or revoked sooner.       Influenza A by PCR NEGATIVE NEGATIVE Final   Influenza B by PCR NEGATIVE NEGATIVE Final    Comment: (NOTE) The Xpert Xpress SARS-CoV-2/FLU/RSV plus assay is intended as an aid in the diagnosis of influenza from Nasopharyngeal swab specimens and should not be used as a sole basis for treatment. Nasal washings and aspirates are unacceptable for Xpert Xpress SARS-CoV-2/FLU/RSV testing.  Fact Sheet for Patients: EntrepreneurPulse.com.au  Fact Sheet for Healthcare Providers: IncredibleEmployment.be  This test is not yet approved or cleared by the [place] FDA and has been authorized for detection and/or diagnosis of SARS-CoV-2 by FDA under an Emergency Use Authorization (EUA). This EUA will remain in effect (meaning this test can be used) for the duration of the COVID-19 declaration under Section 564(b)(1) of the Act, 21 U.S.C. section 360bbb-3(b)(1), unless the authorization is terminated or revoked.  Performed at [facility], [place] [address] [place], [place] [address]   MRSA PCR Screening     Status: Abnormal   Collection Time: 11/21/20  5:50 PM   Specimen: Nasal Mucosa; Nasopharyngeal  Result Value Ref Range Status   MRSA by PCR POSITIVE (A) NEGATIVE Final    Comment:        The GeneXpert MRSA Assay (FDA approved for NASAL specimens only), is one component of  a comprehensive MRSA colonization surveillance program. It is not intended to diagnose MRSA infection nor to guide or monitor treatment for MRSA infections. RESULT CALLED TO, READ BACK BY AND VERIFIED WITH: FIELDS,O. RN _0  11/21/20 BILLINGSLEY,L Performed at [facility], [place] [address], [place], [place] [address]      Labs: BNP (last 3 results) No results for input(s): BNP in the last 8760 hours. Basic Metabolic Panel: Recent Labs  Lab 11/21/20 1249 11/22/20 0310  NA 134* 138  K 4.1 3.7  CL 94* 98  CO2 26 27  GLUCOSE 115* 90  BUN 52* 45*  CREATININE 1.57* 1.42*  CALCIUM 9.0 8.6*  MG  --  1.9   Liver Function Tests: No results for input(s): AST, ALT, ALKPHOS, BILITOT, PROT, ALBUMIN in the last 168 hours. No results for input(s): LIPASE, AMYLASE in the last 168 hours. No results for input(s): AMMONIA in the last 168 hours. CBC: Recent Labs  Lab 11/21/20 1249 11/21/20 1249 11/21/20 1603 11/21/20 1603 11/22/20 0310 11/22/20 1013 11/22/20 2154 11/23/20 0208 11/23/20 1003  WBC 10.6*  --  9.6  --   --   --   --   --   --   NEUTROABS 9.3*  --   --   --   --   --   --   --   --   HGB 5.7 Repeated and verified X2.*   < > 5.3*   < > 7.6* 8.0* 7.8* 7.7* 8.1*  HCT 17.5 Repeated and verified X2.*   < > 17.3*   < > 24.3* 25.6* 25.2* 24.9* 26.0*  MCV 105.9*  --  113.1*  --   --   --   --   --   --  PLT 386.0  --  367  --   --   --   --   --   --    < > = values in this interval not displayed.   Cardiac Enzymes: No results for input(s): CKTOTAL, CKMB, CKMBINDEX, TROPONINI in the last 168 hours. BNP: Invalid input(s): POCBNP CBG: No results for input(s): GLUCAP in the last 168 hours. D-Dimer No results for input(s): DDIMER in the last 72 hours. Hgb A1c No results for input(s): HGBA1C in the last 72 hours. Lipid Profile No results for input(s): CHOL, HDL, LDLCALC, TRIG, CHOLHDL, LDLDIRECT in the last 72 hours. Thyroid function studies No  results for input(s): TSH, T4TOTAL, T3FREE, THYROIDAB in the last 72 hours.  Invalid input(s): FREET3 Anemia work up Recent Labs    11/21/20 1603  VITAMINB12 742  FOLATE 106.8  FERRITIN 195  TIBC 376  IRON 39  RETICCTPCT 10.5*   Urinalysis    Component Value Date/Time   COLORURINE STRAW (A) 11/23/2019 0008   APPEARANCEUR CLEAR 11/23/2019 0008   LABSPEC 1.006 11/23/2019 0008   PHURINE 5.0 11/23/2019 0008   GLUCOSEU NEGATIVE 11/23/2019 0008   HGBUR NEGATIVE 11/23/2019 0008   BILIRUBINUR NEG 02/11/2020 1030   KETONESUR NEGATIVE 11/23/2019 0008   PROTEINUR Negative 02/11/2020 1030   PROTEINUR NEGATIVE 11/23/2019 0008   UROBILINOGEN 0.2 02/11/2020 1030   NITRITE NEG 02/11/2020 1030   NITRITE NEGATIVE 11/23/2019 0008   LEUKOCYTESUR Negative 02/11/2020 1030   LEUKOCYTESUR NEGATIVE 11/23/2019 0008   Sepsis Labs Invalid input(s): PROCALCITONIN,  WBC,  LACTICIDVEN Microbiology Recent Results (from the past 240 hour(s))  Resp Panel by RT-PCR (Flu A&B, Covid) Nasopharyngeal Swab     Status: None   Collection Time: 11/21/20  4:14 PM   Specimen: Nasopharyngeal Swab; Nasopharyngeal(NP) swabs in vial transport medium  Result Value Ref Range Status   SARS Coronavirus 2 by RT PCR NEGATIVE NEGATIVE Final    Comment: (NOTE) SARS-CoV-2 target nucleic acids are NOT DETECTED.  The SARS-CoV-2 RNA is generally detectable in upper respiratory specimens during the acute phase of infection. The lowest concentration of SARS-CoV-2 viral copies this assay can detect is 138 copies/mL. A negative result does not preclude SARS-Cov-2 infection and should not be used as the sole basis for treatment or other patient management decisions. A negative result may occur with  improper specimen collection/handling, submission of specimen other than nasopharyngeal swab, presence of viral mutation(s) within the areas targeted by this assay, and inadequate number of viral copies(<138 copies/mL). A negative  result must be combined with clinical observations, patient history, and epidemiological information. The expected result is Negative.  Fact Sheet for Patients:  EntrepreneurPulse.com.au  Fact Sheet for Healthcare Providers:  IncredibleEmployment.be  This test is no t yet approved or cleared by the [place] FDA and  has been authorized for detection and/or diagnosis of SARS-CoV-2 by FDA under an Emergency Use Authorization (EUA). This EUA will remain  in effect (meaning this test can be used) for the duration of the COVID-19 declaration under Section 564(b)(1) of the Act, 21 U.S.C.section 360bbb-3(b)(1), unless the authorization is terminated  or revoked sooner.       Influenza A by PCR NEGATIVE NEGATIVE Final   Influenza B by PCR NEGATIVE NEGATIVE Final    Comment: (NOTE) The Xpert Xpress SARS-CoV-2/FLU/RSV plus assay is intended as an aid in the diagnosis of influenza from Nasopharyngeal swab specimens and should not be used as a sole basis for treatment. Nasal washings and aspirates are unacceptable  for Xpert Xpress SARS-CoV-2/FLU/RSV testing.  Fact Sheet for Patients: EntrepreneurPulse.com.au  Fact Sheet for Healthcare Providers: IncredibleEmployment.be  This test is not yet approved or cleared by the [place] FDA and has been authorized for detection and/or diagnosis of SARS-CoV-2 by FDA under an Emergency Use Authorization (EUA). This EUA will remain in effect (meaning this test can be used) for the duration of the COVID-19 declaration under Section 564(b)(1) of the Act, 21 U.S.C. section 360bbb-3(b)(1), unless the authorization is terminated or revoked.  Performed at [facility], [place] [address] [place], [place] [address]   MRSA PCR Screening     Status: Abnormal   Collection Time: 11/21/20  5:50 PM   Specimen: Nasal Mucosa; Nasopharyngeal  Result Value Ref Range  Status   MRSA by PCR POSITIVE (A) NEGATIVE Final    Comment:        The GeneXpert MRSA Assay (FDA approved for NASAL specimens only), is one component of a comprehensive MRSA colonization surveillance program. It is not intended to diagnose MRSA infection nor to guide or monitor treatment for MRSA infections. RESULT CALLED TO, READ BACK BY AND VERIFIED WITH: FIELDS,O. RN _0  11/21/20 BILLINGSLEY,L Performed at [facility], [place] [address] [place],  [address]      Time coordinating discharge:  35 minutes  SIGNED:   Barb Merino, MD  [facility] 11/23/2020, 12:53 PM

## 2020-11-23 NOTE — Progress Notes (Signed)
   11/23/20 1934  AVS Discharge Documentation  AVS Discharge Instructions Including Medications Provided to patient/caregiver  Name of Person Receiving AVS Discharge Instructions Including Medications [name] [name] and [name]  Name of Clinician That Reviewed AVS Discharge Instructions Including Medications Zi Sek RN   Patient to be discharged after 2000; capsule study camera to be left at [place] in patient belonging bag with patient label

## 2020-11-23 NOTE — Anesthesia Procedure Notes (Signed)
Date/Time: 11/23/2020 7:38 AM Performed by: Sharlette Dense, CRNA Oxygen Delivery Method: Simple face mask

## 2020-11-23 NOTE — Anesthesia Postprocedure Evaluation (Signed)
Anesthesia Post Note  Patient: [name] [name]  Procedure(s) Performed: ESOPHAGOGASTRODUODENOSCOPY (EGD) (N/A ) GIVENS CAPSULE STUDY (N/A )     Patient location during evaluation: PACU Anesthesia Type: MAC Level of consciousness: awake and alert Pain management: pain level controlled Vital Signs Assessment: post-procedure vital signs reviewed and stable Respiratory status: spontaneous breathing and respiratory function stable Cardiovascular status: stable Postop Assessment: no apparent nausea or vomiting Anesthetic complications: no   No complications documented.  Last Vitals:  Vitals:   11/23/20 0812 11/23/20 0821  BP: (!) 116/34 (!) 134/38  Pulse:  72  Resp:  15  Temp:    SpO2: 100% 100%    Last Pain:  Vitals:   11/23/20 0821  TempSrc:   PainSc: 8    Pain Goal: Patients Stated Pain Goal: 0 (11/23/20 0615)                 Merlinda Frederick

## 2020-11-23 NOTE — Progress Notes (Signed)
Pill cam deployed at 0800.

## 2020-11-23 NOTE — Progress Notes (Signed)
Informed consent signed and placed in patient's chart.

## 2020-11-23 NOTE — Transfer of Care (Signed)
Immediate Anesthesia Transfer of Care Note  Patient: [name] [name]  Procedure(s) Performed: ESOPHAGOGASTRODUODENOSCOPY (EGD) (N/A ) GIVENS CAPSULE STUDY (N/A )  Patient Location: Endoscopy Unit  Anesthesia Type:MAC  Level of Consciousness: drowsy  Airway & Oxygen Therapy: Patient Spontanous Breathing and Patient connected to face mask oxygen  Post-op Assessment: Report given to RN and Post -op Vital signs reviewed and stable  Post vital signs: Reviewed and stable  Last Vitals:  Vitals Value Taken Time  BP    Temp    Pulse    Resp    SpO2      Last Pain:  Vitals:   11/23/20 0710  TempSrc: Oral  PainSc: 8       Patients Stated Pain Goal: 0 (86/82/57 [id number])  Complications: No complications documented.

## 2020-11-24 DIAGNOSIS — D500 Iron deficiency anemia secondary to blood loss (chronic): Secondary | ICD-10-CM | POA: Diagnosis not present

## 2020-11-25 ENCOUNTER — Encounter (HOSPITAL_COMMUNITY): Payer: Self-pay | Admitting: Gastroenterology

## 2020-11-28 ENCOUNTER — Other Ambulatory Visit: Payer: Self-pay

## 2020-11-28 ENCOUNTER — Telehealth: Payer: Self-pay

## 2020-11-28 ENCOUNTER — Encounter (HOSPITAL_BASED_OUTPATIENT_CLINIC_OR_DEPARTMENT_OTHER): Payer: Medicare Other | Admitting: Internal Medicine

## 2020-11-28 DIAGNOSIS — L97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: Secondary | ICD-10-CM | POA: Diagnosis not present

## 2020-11-28 DIAGNOSIS — L89616 Pressure-induced deep tissue damage of right heel: Secondary | ICD-10-CM | POA: Diagnosis not present

## 2020-11-28 DIAGNOSIS — I83222 Varicose veins of left lower extremity with both ulcer of calf and inflammation: Secondary | ICD-10-CM | POA: Diagnosis not present

## 2020-11-28 DIAGNOSIS — I87331 Chronic venous hypertension (idiopathic) with ulcer and inflammation of right lower extremity: Secondary | ICD-10-CM | POA: Diagnosis not present

## 2020-11-28 DIAGNOSIS — L97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: Secondary | ICD-10-CM | POA: Diagnosis not present

## 2020-11-28 DIAGNOSIS — L97312 Non-pressure chronic ulcer of right ankle with fat layer exposed: Secondary | ICD-10-CM | POA: Diagnosis not present

## 2020-11-28 DIAGNOSIS — I70248 Atherosclerosis of native arteries of left leg with ulceration of other part of lower left leg: Secondary | ICD-10-CM | POA: Diagnosis not present

## 2020-11-28 DIAGNOSIS — I70243 Atherosclerosis of native arteries of left leg with ulceration of ankle: Secondary | ICD-10-CM | POA: Diagnosis not present

## 2020-11-28 DIAGNOSIS — L97811 Non-pressure chronic ulcer of other part of right lower leg limited to breakdown of skin: Secondary | ICD-10-CM | POA: Diagnosis not present

## 2020-11-28 DIAGNOSIS — I70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 lower right leg: Secondary | ICD-10-CM | POA: Diagnosis not present

## 2020-11-28 DIAGNOSIS — I872 Venous insufficiency (chronic) (peripheral): Secondary | ICD-10-CM | POA: Diagnosis not present

## 2020-11-28 DIAGNOSIS — L89613 Pressure ulcer of right heel, stage 3: Secondary | ICD-10-CM | POA: Diagnosis not present

## 2020-11-28 NOTE — Telephone Encounter (Signed)
Patient's husband called, patient has swollen RLE. 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 She has an IVC filter placed recently by IR. Explained there was little we could do as we know about the blood clot and cannot prescribe blood thinners but that the IVC filter was a layer of protection. Husband says that the gastric problems may be able to be fixed by cauterization and he will call us back if she becomes able to take blood thinners. In the meantime, they will elevate her leg and try compression.

## 2020-11-29 ENCOUNTER — Other Ambulatory Visit: Payer: Self-pay

## 2020-11-29 DIAGNOSIS — I779 Disorder of arteries and arterioles, unspecified: Secondary | ICD-10-CM

## 2020-11-29 DIAGNOSIS — I96 Gangrene, not elsewhere classified: Secondary | ICD-10-CM | POA: Insufficient documentation

## 2020-11-29 NOTE — Progress Notes (Signed)
[name], [name] ([id number]) Visit Report for 11/28/2020 Debridement Details Patient Name: Date of Service: [name] LO [name] [name] 11/28/2020 12:30 PM Medical Record Number: [id number] Patient Account Number: [id number] Date of Birth/Sex: Treating RN: July 10, 1951 (69 y.o. 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 Performed By: Clinician Deon Pilling, RN Debridement Type: Chemical/Enzymatic/Mechanical Agent Used: Santyl Level of Consciousness (Pre-procedure): Awake and Alert Pre-procedure Verification/Time Out Yes - 14:02 Taken: Start Time: 14:02 Pain Control: Lidocaine 4% Topical Solution Instrument: Forceps Bleeding: Minimum Hemostasis Achieved: Pressure End Time: 14:03 Procedural Pain: 0 Post Procedural Pain: 0 Response to Treatment: Procedure was tolerated well Level of Consciousness (Post- Awake and Alert procedure): Post Debridement Measurements of Total Wound Length: (cm) 0.4 Stage: Category/Stage III Width: (cm) 1.1 Depth: (cm) 0.9 Volume: (cm) 0.311 Character of Wound/Ulcer Post Debridement: Improved Post Procedure Diagnosis Same as Pre-procedure Electronic Signature(s) Signed: 11/28/2020 4:47:57 PM By: Linton Ham MD Signed: 11/28/2020 5:13:46 PM By: Rhae Hammock RN Entered By: Rhae Hammock on 11/28/2020 14:03:12 -------------------------------------------------------------------------------- HPI Details Patient Name: Date of Service: [name] [name], [name] [name] F. 11/28/2020 12:30 PM Medical Record Number: [id number] Patient Account Number: [id number] Date of Birth/Sex: Treating RN: Mar 07, 1951 (69 y.o. [name] [name] Primary Care Provider: Tedra [name] Other Clinician: Referring Provider: Treating Provider/Extender: [name] in  Treatment: 48 History of Present Illness HPI Description: this is a patient who initially came to [place] for wounds on the medial malleoli bilaterally as well as her upper medial lower extremities bilaterally. These wounds eventually healed with assistance of Apligraf's bilaterally. While this was occurring she developed the current wound which opened into a fairly substantial wound on the right lower extremity. These are mostly secondary to venous stasis physiology however the patient also has underlying scleroderma, pulmonary hypertension. The wound has been making good progress lately with the Hydrofera Blue-based dressing. 03/17/2015; patient had a arterial evaluation a year ago. Her right ABI was 0.86 left was 1.0. T brachial index was 0.41 on the right 0.45 on the left. Her oe bilateral common femoral artery waveforms were triphasic. Her white popliteal posterior tibial artery and anterior tibial artery waveforms were monophasic with good amplitude. Luteal artery waveforms were biphasic it was felt that her bilateral great toe pressures are of normal although adequate for tissue healing. 03/24/2015. The condition of this wound is not really improved that. He was covered with as fibrinous surface slough and eschar. This underwent an aggressive debridement with both a curette and scalpel. I still cannot really get down to what I can would consider to be a viable surface. There is no evidence of infection. Previous workup for ischemia roughly a year ago was negative nevertheless I think that continues to be a concern 04/07/15. The patient arrived for application of second Apligraf. Once again the surface of this wound is certainly less viable than I would like for an advanced treatment option. An aggressive debridement was done. She developed some arteriolar bleeding which required that along pressure and silver alginate. 04/21/15: change in the condition of this wound. Once again it is covered in a  gelatinous surface slough. After debridement today surface of the wound looks somewhat better but now a heeling surface 05/05/15 Apligraf #4 applied.Still a lot of slough on this wound. 05/19/15 Apligraf #5 applied. Still a lot of slough on this wound I did  not aggressively remove this 06/02/15; continued copious amounts of surface slough. This debridement fairly easily. 06/08/2015 -- the last time she had a venous duplex study done was over 3 years ago and the surgery was prior to that. I have recommended that she sees Dr. early for a another opinion regarding a repeat venous duplex and possibly more endovenous ablation of vein stripping of micro-phlebotomies. 06/16/15; wound has a gelatinous surface eschar that the debridement fairly easily to a point. I don't disagree with the venous workup and perhaps even arterial re-evaluation. She is on prednisone 5 mg and continue his medications for her pulmonary artery hypertension I am not sure if the latter have any wound care healing issues I would need to investigate this. 06/23/15 continues with a gelatinous surface eschar with of fibrinous underlying. What I can see of this wound does not look unhealthy however I just can't get this material which I think is 2 different layers off. Empiric culture done 06/30/15; unfortunately not a lot of change in this wound. A gelatinous surface eschar is easily removed however it has a tight fibrinous surface underneath the. 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 The wound comes back and basically and unchanged state the. She has a gelatinous surface that is more easily removed however there is a tightly fibrinous surface underneath the. There is no evidence of infection. She has a vascular follow-up next month. I would have to inject her in order to do a more aggressive debridement of this area 07/21/15 the wound is roughly in the same state albeit the debridement is done with greater ease. There is  less of the fibrinous eschar underneath the. There is no evidence of infection. She has follow-up with vascular surgery next week. No evidence of surrounding infection. Her original distal wounds healed while this one formed. 07/28/15; wound is easier to debride. No wound erythema. She is seeing Dr. [name] next week. 08/18/15 Has been to [facility] for repeat ablation. Have been using medihoney pad with some improvement 08/25/15; absolutely no change in the condition of this wound in either its overall size or surface condition in many months now. At one point I had this down to [place] healthier surface I think with [facility] however this did not actually progress towards closure. Do not believe that the wound has actually deteriorated in terms of volume at all. We have been using a medihoney pad which allows easier debridement of the gelatinous eschar but again no overall actual improvement. the patient is going towards an ablation with pain and vascular which I think is scheduled for next week. The only other investigation that I could first see would be a biopsy. She does have underlying scleroderma 09/02/15 eschar is much easier to debridement however the base of this does not look particularly vibrant. We changed to Iodoflex. The patient had her ablation earlier this week 09/09/15 again the debridement over the base of this wound is easier and the base of the wound looks considerably better. We will continue the Iodoflex. Dr. Tawni Millers has expressed his satisfaction with the result of her ablation 09/15/15 once again the wound is relatively free of surface eschar. No debridement was done today. It has a pale-looking base to it. although this is not as deep as it once was it seems to be expanding especially inferiorly. She has had recent venous ablation but this is no closer to healing.I've gone ahead and done a punch biopsy this  from the inferior part of the wound close to normal skin 09/22/15: the  wound is relatively free of surface eschar. There is some surrounding eschar. I'm not exactly sure at what level the surface is that I am seeing. 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 No evidence of atypical infection, malignancy. 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 No debridement was done 09/29/15; the wound surface is relatively free of surface eschar. There is eschar surrounding the walls of the wound. Aside from the improvement in the amount of surface slough. This wound has not progressed any towards closure. There is not even a surface that looks like there at this is ready. There is no evidence of any infection nor maligancy based on biopsy I did on 9/15. I continued with the Iodoflex however I am looking towards some alternative to try to promote some closure or filling in of this surface. Consider triple layer Oasis. Collagen did not result in adequate control of the surface slough 10/13/15; the patient was in hospital last week with severe anemia. The wound looks somewhat better after debridement although there is widening medially. There is no evidence of infection. 10/20/15; patient's wound on the right lateral lower leg is essentially unchanged. This underwent a light surface debridement and in general the debridement is easier and the surface looks improved. I noted in doing this on the side of the wound what appears to be a piece of calcium deposition. The patient noted that she had previous things on the tips of her fingers. 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 She follows with rheumatology and I have asked them to talk to her about this. In view of that S the nonhealing ulcer may have something to do with calcinosis and also unrelieved Raynaud's disease in this area. I should note that her original wounds on the right and left medial malleolus  and the inner aspects of both legs just below the knees did however heal over 10/30/15; the patient's wound on the right lateral lower leg is essentially unchanged. I was able to remove some calcified material from the medial wound edge. 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 Otherwise the wound appears essentially unchanged there is less adherent surface eschar. Some of the calcified material was sent to pathology for analysis 11/17/15. The patient's wound on the right lateral leg is essentially unchanged. Wider Medially. 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 There is some surface slough but I did not debridement this. No evidence of surrounding infection. 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 This will make healing difficult 12/15/15; the wound has less depth surface has a fibrinous slough and calcifications in the wound edge no evidence of infection 12/22/15; the wound definitely has less Fibrinous slough on the base. Calcifications around the wound edges are still evident. Although the wound bed looks healthier it is still pale in appearance. Previous biopsy did not show malignancy 01/04/15; surgical debridement of nonviable slough and subcutaneous tissue the wound cleans up quite nicely but appears to be expanding outward calcifications around the wound edges are still there. Previous biopsy did not show  malignancy fungus or vasculitis but a panniculitis. She is to see her rheumatologist I'll see if he has any opinions on this. My punch biopsy done in September did not show calcifications although these are clearly evident. 01/19/16 light selective  debridement of nonviable surface slough. There is epithelialization medially. This gives me reason for cautious optimism. She has been to see her rheumatologist, there is nothing that can be done for this type of soft tissue calcification associated with scleroderma 02/02/16 no debridement although there is a light surface slough. She has 2 peninsulas of skin 1 inferiorly and one medially. We continue to make a slight and slow but definite progressive here 02/16/16; light surface debridement with more attention to the circumference of the wound bed where the fibrinous eschar is more prevalent. No calcifications detected. She seems to have done nicely with the [facility] with some epithelialization and some improvement in the overall wound volume. 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 Continued attention to the circumference of the wound where the fibrinous eschar in calcinosis or prevalent. No calcifications were detected. She has continued improvement with Hydrofera Blue. The wound is no longer as deep 03/15/16 surgical debridement done to remove surface escha Especially around the circumference of the wound where there is nonviable subcutaneous tissue. In spite of this there is considerable improvement in the overall dimensions and depth of the wound. Islands of epithelialization are seen especially medially inferiorly and superiorly to a lesser extent. She is using Hydrofera Blue at home 03/29/16; surgical debridement done to remove surface eschar and nonviable subcutaneous tissue. This cleans up quite nicely mention slightly larger in terms of length and width however depth is less 04/12/16; continued gradual improvement in terms of depth and the condition of the wound base. Debridement is done. Continuing long standing Hydrofera Blue at home with Kerlix Coban  wraps 04/26/16; continued gradual improvement in terms of depth and management as well as condition of the wound base. Surgical debridement done she continues with Hydrofera Blue. 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 She initially came to this clinic venous insufficiency ulcers which have long since healed 05/17/16 continued improvement in terms of the depth and measurements of this wound although she has a tightly adherent fibrinous slough each time. We've been continued with long standing Hydrofera Blue which seems to done as well for this wound is many advanced treatment options. 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 She also has chronic venous insufficiency. She has an irritation on her lateral right ankle secondary to our wraps 05/10/16; wound appears to be smaller especially on the medial aspect and especially in the width. Wound was debridement surface looks better. She is also been in the hospital apparently with anemia again she tells me she had an endoscopy. Since she got home after 3 days which I believe was sometime last week she has had an irritated painful area on the right lateral ankle surrounding the lateral malleolus 05/31/16; much more adherent surface slough today then recently although I don't think the dimensions of changed that much. A more aggressive debridement is required. The irritated area over her medial malleolus is more pruritic and painful and I don't think represents cellulitis 06/14/16; no major change in her wound dimensions however there is more tightly adherent surface slough which is disappointing. As  well as she appears to have a new small area medially. Furthermore an irritated uncomfortable area on the lateral aspect of her right foot just below the lateral malleolus. 06/21/16; I'm seeing the patient back in follow-up for the new areas under her major wound on the right anterior  leg. She has been using Hydrofera Blue to this area probably for several months now and although the dressings seem to be helping for quite a period of time I think things have stagnated lately. She comes in today with a relatively tight adherent surface slough and really no changes in the wound shape or dimension. The 2 small areas she had inferiorly are tiny but still open they seem improved this well. There is no uncontrolled edema and I don't think there is any evidence of cellulitis. 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 Patient has had vascular evaluations both venous and arterial. I have biopsied this area. There is no obvious infection. The worrisome thing today is that she seems to be developing areas of erythema and epithelial damage on the medial aspect of the right foot. Also to a lesser degree inferior to the actual wound itself. Again I see no obvious changes to suggest cellulitis however as this is the only treatable option I will probably give her antibiotics. 07/13/16 no major change in the lady's large anterior right leg wound. Still covered with a very tightly adherent surface slough which is difficult to debridement. There is less erythema around this, culture last week grew pseudomonas I gave her ciprofloxacin. The area on her lateral right malleolus looks better- 08/02/16 the patient's wounds continued to decline. Her original large anterior right leg wound looks deeper. Still adherent surface slough that is difficult to debridement. She has a small area just below this and a punched-out wound just below her lateral malleolus. In the meantime she is been in hospital with apparently an upper GI bleed on Plavix and aspirin. She is now just on Plavix she received 3 units of packed cells 08/23/16; since I last saw this 3 weeks ago, the open large area on her right leg looks  about the same syrup. She has a small satellite lesion just underneath this. The area on her medial right ankle is now a deep necrotic wound. I attempted to debridement this however there is just too much pain. 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 She follows with vascular surgery and is scheduled for an angiogram in early September 09/06/16; the patient is going for an arteriogram tomorrow. Her original large wound on the right calf is about the same the satellite lesion underneath it is about the same however the area on her medial ankle is now deeper with exposed tendon. I am no longer attempting to debride these wounds 09/20/16; the patient has undergone a right femoral endarterectomy and Dacron patch angioplasty. This seems to have helped the flow in her right leg. 10/04/16; Arrived today for aggressive debridement of the wounds on her right calf the original wound the one beneath it and a difficult area over her right lateral leg just above the lateral malleolus. 10/25/16; her 3 open wounds are about the same in terms of dimensions however the surface appears a lot healthier post debridement. Using Iodoflex 10/18/16 we have been using Iodoflex to her wounds which  she tolerated with some difficulty. 10/11/16; has been using Santyl for a period of time with some improvement although again very adherent surface slough would prevent any attempted healing this. She has a original wound on the left calf, the satellite underneath that and the most recent wound on the right medial ankle. She has completed revascularization by Dr. early and has had venous ablation earlier. Want to go back to Iodoflex to see if week and get a healthier surface to this wound bed failing this I think she'll need to be taken to the OR and I am prepared to call Dr. Marla Roe to discuss this. She is obviously not a good candidate for general anesthesia  however.; 11/08/16; I put her on Iodoflex last time to see if I can get the wound bed any healthier and unfortunately today still had tremendous surface slough. 11/15/16; 4 weeks' worth of Iodoflex with not much improvement. Debridement on the major wounds on her left anterior leg is easier however this does not maintain from week to week. The punched-out area on her medial right ankle 11/29/16; I attempted to change the patient last visit to [facility] however she states this burned and was very uncomfortable therefore we gave her permission to go back to the eye out of complex which she already had at home. Also she noted a lot of pain and swelling on the lateral aspect of her leg before she traveled to [facility] for the holidays, I called her in doxycycline over the phone. This seems to have helped 12/06/16; Wounds unchanged by in large. Using Iodoflex 12/13/16; her wounds today actually looks somewhat better. The area on the right lateral lower leg has reasonably healthy-looking granulation and perhaps as actually filled and a bit. Debridement of the 2 wounds on the medial calf is easier and post debridement appears to have a healthier base. We have referred her to Dr. Migdalia Dk for consideration of operative debridement 12/20/16; we have a quick note from Dr. Merri Ray who feels that the patient needs to be referred to an academic center/plastic surgeon. This is due to the complexity of the patient's medical issues as it applies to anything in the OR. We have been using Iodoflex 12/27/16; in general the wounds on the right leg are better in terms of the difficult to remove surface slough. She has been using Iodoflex. She is approved for Apligraf which I anticipated ordering in the next week or 2 when we get a better-looking surface 01/04/16 the deep wounds on the right leg generally look better. Both of them are debrided further surface slough. The area on the lateral right  leg was not debrided. She is approved for Apligraf I think I'll probably order this either next week or the week after depending on the surface of the wounds superiorly. We have been using Iodoflex which will continue until then 01/11/16; the deep wounds on the right leg again have a surface slough that requires debridement. I've not been able to get the wound bed on either one of these wounds down to what would be acceptable for an advanced skin stab-like Apligraf. The area on the medial leg has been improving. We have been using hot Iodoflex to all wounds which seems to do the best at at least limiting the nonviable surface 01/24/17; we have continued Iodoflex and all her wound areas. Her debridement Gen. he is easier and the surface underneath this looks viable. Nevertheless these are large area wounds with exposed muscle at  least on the anterior parts. We have ordered Apligraf's for 2 weeks from now. The patient will be away next week 02/07/17; the patient was close to have first Apligraf today however we did not order one. I therefore replaced her Iodoflex. She essentially has 3 large punched- out areas on her right anterior leg and right medial ankle. 02/11/17; Apligraf #1 02/25/17; Apligraf #2. In general some improvement in the right medial ankle and right anterior leg wounds. The larger wound medially perhaps some better 03/11/17; Apligraf #3. In general continued improvement in the right medial ankle and the right anterior leg wounds. 03/25/17 Apligraf #4. In general continued improvement especially on the right medial ankle and the lower 04/08/17; Apligraf #5 in general continued improvement in all wound sites. 04/22/17; post Apligraf #5 her wound beds continued to look a lot better all of them up to the surface of the surrounding skin. Had a caramel-colored slough that I did not debrided in case there is residual Apligraf effect. The wounds are as good as I have seen these looking quite some  time. 04/29/17; we applied [place] and last week after completing her Apligraf. Wounds look as though they've contracted somewhat although they have a nonviable surface which was problematic in the past. Apparently she has been approved for further Apligraf's. We applied Iodosorb today after debridement. 05/06/17; we're fortunate enough today to be able to apply additional Apligraf approved by her insurance. In general all of her wounds look better Apligraf #6 05/24/17; Apligraf #7 continued improvement in all wounds 3 06/10/17 Apligraf #8. Continued improvement in the surface of all wounds. Not much of an improvement in dimensions as I might a follow 06/24/17 Apligraf #9 continued general improvement although not as much change in the wound areas I might of like. She has a new open area on the right anterior lateral ankle very small and superficial. She also has a necrotic wound on the tip of her right index finger probably secondary to severe uncontrolled Raynaud's phenomenon. She is already on sildenafil and already seen her rheumatologist who gave her Keflex. 07/08/17; Apligraf #10. Generalized improvement although she has a small additional wound just medial to the major wound area. 07/22/17; after discussion we decided not to reorder any further Apligraf's although there is been considerable improvement with these it hasn't been recently. The major wound anteriorly looks better. Smaller wounds beneath this and the more recent one and laterally look about the same. The area on the right lateral lower leg looks about the same 07/29/17 this is a patient who is exceedingly complex. She has advanced scleroderma, crest syndrome including calcinosis, PAD status post revascularization, chronic venous insufficiency status post ablations. She initially presented to this clinic with wounds on her bilateral lower legs however these closed. More recently we have been dealing with a large open area superiorly on  the right anterior leg, a smaller wound underneath this and then another one on the right medial lower extremity. These improve significantly with 10 Apligraf applications. Over the last 2-3 weeks we are making good progress with Hydrofera Blue and these seem to be making progress towards closure 08/19/17; wounds continued to make good improvement with Hydrofera Blue and episodic aggressive debridements 08/26/17; still using Hydrofera Blue. Good improvements 09/09/17; using Hydrofera Blue continued improvement. Area on the lateral part of her right leg has only a small remaining open area. The small inferior satellite region is for all intents and purposes closed. Her major wound also is come  in in terms of depth and has advancing epithelialization. 09/16/17; using Hydrofera Blue with continued improvement. The smaller satellite wound. We've closed out today along with a new were satellite wound medially. The area on her medial ankle is still open and her major wound is still open but making improvement. All using Hydrofera Blue. Currently 09/30/17; using Hydrofera Blue. Still a small open area on the lateral right ankle area and her original major wound seems to be making gradual and steady improvement. 10/14/17; still using Hydrofera Blue. Still too small open areas on the right lateral ankle. Her original major wound is horizontal and linear. The most problematic area paradoxically seems to be the area to the medial area wears I thought it would be the lateral. 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 10/28/17; still using Hydrofera Blue. Right lateral ankle has a very small open area superiorly on the most lateral part of the wound. Her original open wound has 2 open areas now separated by normal skin and we've redefined this. 11/11/17; still using Hydrofera Blue area and right lateral ankle continues to have a small open area on mostly the lateral  part of the wound. Her original wound has 2 small open areas now separated by a considerable amount of normal skin 11/28/17; the patient called in slightly before Thanksgiving to report pain and erythema above the wound on the right leg. In the past this is responded well to treatment for cellulitis and I gave her over the phone doxycycline. She stated this resulted in fairly abrupt improvement. We have been using Hydrofera Blue for a prolonged period of time to the larger wound anteriorly into the remaining wound on her right right lateral ankle. The latter is just about closed with only a small linear area and the bottom of the [place]. 12/02/17; use endoform and left the dressing on since last visit. There is no tenderness and no evidence of infection. 12/16/17; patient has been using Endoform but not making much progress. The 2 punched out open areas anteriorly which were the reminiscence of her major wound appear deeper. The area on the lateral aspect of her right calf also appears deeper. Also she has a puzzling tender swelling above her wound on the right leg. This seems larger than last time. Just above her wounds there appears to be some fluctuance in this area it is not erythematous and there is no crepitus 12/30/17; patient has been using Endoform up until last week we used Hydrofera Blue. Ultrasound of the swelling above her 2 major wounds last time was negative for a fluid collection. I gave her cefaclor for the erythema and tenderness in this area which seems better. Unfortunately both punched out areas anteriorly and the area on her right medial lower leg appear deeper. In fact the lateral of the wounds anteriorly actually looks as though it has exposed tendon and/or muscle sheath. She is not systemically unwell. She is complaining of vaginitis type symptoms presumably Candida from her antibiotics. 01/06/18; we're using santyl. she has 2 punched out areas anteriorly which were initially part  of a large wound. Unfortunately medially this is now open to tendon/muscle. All the wounds have the same adherent very difficult to debride surface. 01/20/18; 2 week follow-up using Santyl. She has the 2 punched out areas anteriorly which were initially part of her large surface wound there. Medially this still has exposed muscle. All of these have the same tightly adherent necrotic  surface which requires debridement. PuraPly was not accepted by the patient's insurance however her insurance I think it changed therefore we are going to run Apligraf to gain 02/03/18; the patient has been using Santyl. The wound on the right lateral ankle looks improved and the 2 areas anteriorly on the right leg looks about the same. The medial one has exposed muscle. The lateral 1 requiresdebridement. We use PuraPly today for the first week 02/10/18; PuraPly #2. The patient has 3 wounds. The area on the right lateral ankle, 2 areas anteriorly that were part of her original large wound in this area the medial one has exposed muscle. All of the wounds were lightly debrided with a number 3 curet. PuraPly #2 applied the lateral wound on the calf and the right lateral ankle look better. 02/17/18; PuraPly #3. Patient has 3 wounds. The area on the right lateral ankle in 2 areas internally that were part of her original large wound. The lateral area has exposed muscle. She arrived with some complaints of pain around the right ankle. 02/24/18; PuraPly #4; not much change in any of the 3 wound areas. Right lateral ankle, right lateral calf. Both of these required debridement with a #3 curet. She tolerates this marginally. The area on the medial leg still has exposed muscle. Not much change in dimensions 03/03/18 PuraPly #5. The area on the medial ankle actually looks better however the 2 separate areas that were original parts of the larger right anterior leg wound look as though they're attempting to coalesce. 03/10/18; PuraPly #6. The  area on the medial ankle actually continues to look and measures smaller however the 2 separate areas that were part of the original large wound on the right anterior leg have now coalesced. There hasn't been much improvement here. The lateral area actually has underlying exposed muscle 03/17/18-she is here in follow-up evaluation for ulcerations to the right lower extremity. She is voicing no complaints or concerns. She tolerated debridement. Puraply#7 placed 03/24/18; difficult right lower extremity ulcerations. PuraPly #8 place. She is been approved for [employer]. She did very well with Apligraf today however she is apparently reached her "lifetime max" 03/31/18; marginal improvement with PuraPly although her wounds looked as good as they have in several weeks today. Used TheraSkins #1 04/14/18 TheraSkin #2 today 04/28/18 TheraSkins #3. Wound slightly improved 05/12/18; TheraSkin skin #4. Wound response has been variable. 05/27/18 TheraSkin #5. Generally improvement in all wound areas. I've also put her in 3 layer compression to help with the severe venous hypertension 06/09/18; patient is done quite well with the TheraSkins unfortunately we have no further applications. I also put her in 3 layer compression last week and that really seems to of helped. 06/16/18; we have been using silver collagen. Wounds are smaller. Still be open area to the muscle layer of her calf however even that is contracted somewhat. She tells me that at night sometimes she has pain on the right lateral calf at the site of her lower wound. Notable that I put her into 3 layer compression about 3 weeks ago. She states that she dangles her leg over the bed that makes it feel better but she does not describe claudication during the day She is going to call her secondary insurance to see if they will continue to cover advanced treatment products I have reviewed her arterial studies from 01/22/17; this showed an ABI in the right of 1  and on the left noncompressible. TBI on the right at 0.30  on the left at 0.34. It is therefore possible she has significant PAD with medial calcification falsely elevating her ABI into the normal range. I'll need to be careful about asking her about this next week it's possible the 3 layer compression is too much 06/23/18; was able to reapply TheraSkin 1 today. Edema control is good and she is not complaining of pain no claudication 07/07/18;no major change. New wound which was apparently a taper removal injury today in our clinic between her 2 wounds on the right calf TheraSkins #2 07/14/18; I think there is some improvement in the right lateral ankle and the medial part of her wound. There is still exposed muscle medially. 07/28/18; two-week follow-up. TheraSkins#3. Unfortunately no major change. She is not a candidate I don't think for skin grafting due to severe venous hypertension associated with her scleroderma and pulmonary hypertension 08/11/18 Patient is here today for her [employer] application #59 (#5 of the second set). She seems to be doing well and in the base of the wound appears to show some progress at this point. This is the last approved Theraskin of the second set. 08/25/18; she has completed TheraSkin. There has been some improvement on the right lateral calf wound as well as the anterior leg wounds. The open area to muscle medially on the anterior leg wound is smaller. I'm going to transition her back to [facility] under Kerlix Coban change every second day. She reports that she had some calcification removed from her right upper arm. We have had previous problems with calcifications in her wounds on her legs but that has not happened recently 09/08/18;using Hydrofera Blue on both her wound areas. Wounds seem to of contracted nicely. She uses Kerlix Coban wrap and changes at home herself 09/22/2018; using Hydrofera Blue on both her wound areas. Dimensions seem to have come down  somewhat. There is certainly less depth in the medial part of the mid tibia wound and I do not think there is any exposed muscle at this point. 10/06/2018; 2-week follow-up. Using [facility] on her wound areas. Dimensions have come down nicely both on the right lateral ankle area in the right mid tibial area. She has no new complaints 10/20/2018; 2-week follow-up. She is using Hydrofera Blue. Not much change from the last time she was here. The area on the lateral ankle has less depth although it has raised edges on one side. I attempted to remove as much of the raised edge as I could without creating more additional wounds. The area on the right anterior mid tibia area looks the same. 11/03/2018; 2-week follow-up she is using Hydrofera Blue. On the right anterior leg she now has 2 wounds separated by a large area of normal skin. The area on the medial part still has I think exposed muscle although this area itself is a lot smaller. The area laterally has some depth. Both areas with necrotic debris. The area on her right lateral ankle has come in nicely 11/17/2018; patient continues to use Hydrofera Blue. We have been increasing separation of the 2 wounds anteriorly which were at one point joint the area on the right lateral calf continues to have I think some improvement in depth. 12/08/2018; patient continues to use Hydrofera Blue. There is some improvement in the area on the right lateral calf. The 2 areas that were initially part of the original large wound in the mid right tibia are probably about the same. In fact the medial area is probably somewhat  larger. We will run puraply through the patient's insurance 12/22/2018; she has been using Hydrofera Blue. We have small wounds on the right lateral calf and 2 small areas that were initially part of a large wound in the right mid tibia. We applied pure apply #1 today. 12/29/2018; we applied puraply #2. Her wounds look somewhat better especially  on the right lateral calf and the lateral part of her original wound in the mid tibial area. 01/05/2019; perhaps slightly improved in terms of wound bed condition but certainly not as much improvement as I might of liked. Puraply #3 1/13: we did not have a correct sized puraply to apply. wounds more pinched out looking, I increased her compression to 3 layer last week to help with significant multilevel venous hypertension. Since then I've reviewed her arterial status. She has a right femoral endarterectomy and a distal left SFA stent. She was being followed by Dr. [name] for a period however she does not appear to have seen him in 3 years. I will set up an appointment. 1/20. The patient has an additional wound on the right lateral calf between the distal wound and proximal wounds. We did not have Puraply last week. Still does not have a follow-up with Dr. early 1/27: Follow-up with Dr. early has been arranged apparently with follow-up noninvasive studies. Wounds are measuring roughly the same although they certainly look smaller 2/3; the patient had non invasive studies. Her ABIs on the right were 0.83 and on the left 1.02 however there was no great toe pressure bilaterally. Also worrisome monophasic waveforms at the PTA and dorsalis pedis. We are still using Puraply. We have had some improvement in all of the wounds especially the lateral part of the mid tibial area. 2/10; sees Dr. early of vein and vascular re-arterial studies next week. Puraply reapplied today. 2/17; Dr. early of vein and vascular his appointment is tomorrow. Puraply reapplied after debridement of all wounds 2/24; the patient saw Dr. early I reviewed his note. sHe noted the previous right femoral endarterectomy with a Dacron patch. He also noted the normal ABI and the monophasic waveforms suggesting tibial disease. Overall he did not feel that she had any evidence of arterial insufficiency that would impair her wound healing. He  did note her venous disease as well. He suggested PRN follow-up. 3/2; I had the last puraply applied today. The original wounds over the mid tibia area are improved where is the area on the right lower leg is not 3/9; wounds are smaller especially in the right mid tibia perhaps slightly in the right lateral calf. We finished with puraply and went to endoform today 3/23; the patient arrives after 2 weeks. She has been using endoform. I think all of her wounds look slightly better which includes the area on the right lateral calf just above the right lateral malleolus and the 2 in the right mid tibia which were initially part of the same wound. 4/27; TELEHEALTH visit; the patient was seen for telehealth visit today with her consent in the middle of the worldwide epidemic. Since she was last here she called in for antibiotics with pain and tenderness around the area on the right medial ankle. I gave her empiric doxycycline. She states this feels better. She is using endoform on both of these areas 5/11 TELEHEALTH; the patient was seen for telehealth visit today. She was accompanied at home by her husband. She has severe pulmonary hypertension accompanied scleroderma and in the face of the Covid  epidemic cannot be safely brought into our clinic. We have been using endoform on her wound areas. There are essentially 3 wound areas now in the left mid tibia now 2 open areas that it one point were connected and one on the right lateral ankle just above the malleolus. 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 She cannot come to clinic. The wounds were reviewed today via telehealth. She has severe chronic venous hypertension which I think is centrally mediated. She has wounds on her right anterior tibia and right lateral ankle area.  These are chronic. She has been using endoform. 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 We have been following her from telehealth. She has severe chronic venous hypertension which may be mostly centrally mediated secondary to right heart heart failure. She has wounds on her right anterior tibia and right lateral ankle these are chronic we have been using endoform 6/22; TELEHEALTH; this patient was seen today via telehealth. 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 Since the last time we had contact with this patient she developed some pain and erythema around the wound on her right lateral malleolus/ankle and we put in antibiotics for her. This is resulted in good improvement with resolution of the erythema and tenderness. I changed her to silver alginate last time. We had been using endoform for an extended period of time 7/13; TELEHEALTH; this patient was seen today via telehealth. 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 She would continue to be at a prohibitive risk to be brought into our clinic unless this was absolutely necessary. These visits have been done with her approval as well as her husband. We have been using silver alginate to the areas on the right mid tibia and right lateral lower leg. 7/27 TELEHEALTH; patient was seen along with her husband today via telehealth. 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 We changed her to sample last visit. She has 2 areas a chronic wound on her right mid tibia and one just above her ankle. These were not the original wounds when she came into this clinic but she developed them during  treatment 8/17; she comes in for her first face-to-face visit in a long period. She has a remaining area just medial to the right tibia which is the last open part of her large wound across this area. She also has an area on the right lateral lower leg. We prescribed Santyl last telehealth visit but they were concerned that this was making a deeper so they put silver alginate on it last week. Her husband changes the dressings. 8/31; using Santyl to the 2 wound areas some improvement in wound surfaces. Husband has surgery in 2 weeks we will put her out 3 weeks. Any of the advanced treatment options that I can think of that would be eligible for this wound would also cause her to have to come in weekly. The risk that the patient is just too high 9/21. Using Santyl to the 2 wound areas. Both of  these are somewhat better although the medial mid tibia area still has exposed muscle. Lateral ankle requiring debridement. Using Santyl 10/12; using Santyl to the 2 wound areas. One on the right lateral ankle and the other in the medial calf which still has exposed muscle. Both areas have come down in size and have better looking surfaces. She has made nice progress with santyl 11/2; we have been using Santyl to the 2 wound areas. Right lateral ankle and the other in the mid tibia area the medial part of this still has exposed muscle. 11/23/2019 on evaluation today patient appears to be doing about the same really with regard to her wounds. She is actually not very pleased with how things seem to be progressing at this point she tells me that she really has not noted much improvement unfortunately. With that being said there is no signs of active infection at this time. There is some slough buildup noted at this time which again along with some dry skin around the edges of the wound I think would benefit her to try to debride some of this away. Fortunately her pain is doing fairly well. She still has exposed  muscle in the right medial/tibial area. 12/14; TELEHEALTH; she was changed to [facility] to the right calf wound and right lateral ankle wound when she was here last time. Unfortunately since then she had a fall with a pelvic fracture and a fracture of her wrist. She was apparently hospitalized for 5 days. I have not looked at her discharge summary. She apparently came out of the hospital with a blister on her right heel. She was seen today via telehealth by myself and our case manager. The patient and her husband were present. She has been using Hydrofera Blue at our direction from the last time she was in the clinic. There is been no major improvement in fact the areas appear deeper and with a less viable surface 01/04/2020; TELEHEALTH; the patient was seen today in accompaniment of her husband and our nurse. She has 3 open areas 1 on the right medial mid tibia, one on the right lateral ankle and a large eschared pressure area on the right heel. We have been using Iodoflex to the 2 original wounds. 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 It is simply too perilous for her to be seen in any other way 1/26; TELEHEALTH; the patient was seen today in accompaniment of her husband and our RN one of our nurses. She still has the 3 open areas 1 on the right medial mid tibia which is the remanent of a more extensive wound in this area, one on the right lateral ankle and a large eschared pressure area on the right heel. We have been using Iodoflex to the 2 original wounds and a bed at 9 application to the eschared area on the heel 2/15; the first time we have seen this patient and then several months out of concern for the pandemic. She had a large horizontal wound in the mid tibia. Only the medial aspect of this is still open. Area on the lateral ankle is just about closed. She had a new pressure ulcer on the right heel which I have removed some of the eschar. We have been  using Iodoflex which I will continue. The area in the mid tibia has a round circle in the middle of exposed muscle. I think we would have to use an advanced treatment product to stimulate granulation  over this area. We will run this through her insurance. She is not eligible for plastic surgery for many reasons 3/1; TELEHEALTH. The patient was seen today by telehealth. 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 We have been using Iodoflex to the wound areas which include the right anterior mid tibia, right lateral ankle and the right heel. All of these were reviewed 3/8; the patient's wound just above the right ankle is closed. She still has a contracting black eschar on the heel where she had a pressure ulcer. The medial part of her original wound on the mid tibia has exposed muscle. We have made really made no progress in this area although we have managed to get a lot of the original wound in this area to close. We used Apligraf #1 today 3/22; the patient's ankle wound remains closed. She still has a contracting black eschar on the heel although it seems to have less surface area. It still not clear whether there is any depth here. We used Apligraf #2 today in an attempt to get granulation over the exposed muscle and what is left of her mid tibia area. 4/5; everything is closed except the medial aspect of the mid tibia wound, as well as the pressure injury on the right heel. She has been using Betadine to the right heel which has been gradually contracting. We used Apligraf #3 today. 4/19; Apligraf #4. Still a pressure area on the right heel she has been using Betadine superficial excoriated areas she has been applying salicylic acid to on the anterior leg and the thick horn on the leg just above her wound area 5/3; Apligraf #5. Unfortunately she came in today with a reopening of the area on the right  lateral lower leg. Not much change in the wound we have been treating in the mid calf. Quite a bit of edema surrounding this wound. She reports that she was up on her feet quite a bit. The area on the right heel is separating she has been using Betadine She comes in with a new wound on the left lateral malleolus which is very disappointing this is been open since last week 5/17; we applied her last Apligraf last time. Unfortunately that did not have any effect on the deep area medially in the mid tibial area. However the area over the right lateral malleolus is a lot better. Right heel also is contracted we used Iodoflex last time. The new wound on the left lateral malleolus were using Santyl to. This required debridement. 6/1. Not much change in the area on the right medial mid tibia. Right lateral ankle is improved she has the necrotic area on the right heel which was a pressure area. New area on the left lateral malleolus from last time. I changed her to Iodoflex to the area on the left lateral malleolus she said this hurt I have put her back on Santyl 6/15; right mid tibia is unchanged. I do not think there is anything I can do to this topically that we will get this to granulate over the muscle. And I am furthermore I am not sure that she is a surgical candidate either because of her severe pulmonary issues or because she really does not want to go through it. The area on the right lateral malleolus is a small punched out area. The area on the left lateral malleolus again small punched out with nonviable  surface Pressure ulcer on the right heel at separating black eschar. I went ahead and remove this today she has an area on the left anterior mid tibia just laterally. Geographic wounds debris on the surface. Some erythema here. 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 There is skin changes widely Change the primary dressing to Sorbact to all wound areas. She needs  compression on the left leg as well as the right 6/21; right mid tibia which was her one remaining wound at 1 point is unchanged The area on the right lateral malleolus actually is close to closing over still a small punched out area The area on the left lateral malleolus again a deep wound it is this 1 that she feels pain in. Pressure ulcer on the right heel was cultured today. New area on the left anterior mid tibia several geographic wounds last week in the setting of chronic venous insufficiency this actually looks some better 7/6; Right mid tibia about the same. Exposed muscle Left lateral malleolus again necrotic debris over the surface. Pressure ulcer on the right heel. She finished the Keflex we prescribed. Necrotic debris debrided with a #3 curette The rest of her wounds on the right mid tibia right lateral ankle are better Her husband reminds me that she had a right femoral endarterectomy and has 2 stents in her left thigh placed in 2011 approximately by vascular surgery in [place]. She saw Dr. [name] last in February 2020. At that point he did not think that the arterial insufficiency she had on the right was sufficient to explain any of her right lower extremity wounds however she now has an area on the left lateral malleolus that looks like an ischemic wound 7/12; we have been using Sorbact all wound areas Right mid tibia about the same exposed muscle Left lateral malleolus small wound with debris in the surface punched out Pressure ulcer of the right heel requiring debridement of necrotic debris Lateral ankle wound on the right is just about closed Right mid tibia wound about the same still with exposed muscle. 7/26 really no change in any wounds. We have been using Sorbact. She has bilateral lower extremity wounds. This is in the setting of scleroderma, severe venous hypertension. She also has known PAD and I had really hoped to be able to get a review by Dr. [name] but apparently  that is not booked until August 31. I have asked her to call back to vein and vascular and get an appointment with somebody a little earlier if that is possible. 8/16; patient's area on the right ankle which was a chronic wound is closed over again. The area on the right mid lower leg, right heel left lateral ankle are all about the same. Pressure ulcer on the right heel is still not viable. New areas identified on the right lateral calf. She has a follow-up with Dr. [name] on 8/31. Phenomenally I would like him to go over her arterial status with regards to her underlying stents. 9/13; patient had her ARTERIAL studies done however apparently Dr. [name] is now in [place] so she did not get an appointment with him on 8/31 and was not referred to any other provider. On the right side her PTA was monophasic. Noncompressible. TBI on the right at 0.41 on the left she was monophasic and noncompressible they did not do a TBI because she had a Band-Aid on her big toe. Patient does not describe claudication although she is having a lot  of pain in the left lateral malleolus. 10/11; the patient had a right-sided interventions on 10/5. She had a angioplasty of the right anterior tibial artery as well as the tibioperoneal trunk/peroneal artery she had a drug-coated balloon angioplasty of the right superficial femoral artery. On the left she did not have any interventions yet but is going to have another look at this in 2 weeks. The superficial femoral artery on the left and the associated stent are patent popliteal artery is patent to the dominant vessel runoff is the anterior tibial which has several high-grade stenosis in the proximal one third. We have been using Sorbact. She also has a new area on the right upper mid tibia. 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 She tolerated this well. She is using Iodoflex and her husband is doing a kerlix Coban on her. She arrives in clinic today with the original medial part of her mid tibia wound with muscle exposure small area on the right ankle which was part of her original wound she has 2 punched-out areas laterally and 1 above her area anteriorly. Nonviable surfaces similarly the right heel is nonviable. On the left side she has an area on the left foot and the left lateral ankle similar nonviable surfaces. 11/8; patient arrives in clinic today with nothing improved. She has a multiple lightly group of wounds on the right lower leg presumably different etiologies including a skin biopsy, the original wound she had medially and some punched-out areas that are not of clear defined etiology. She says she was in a lot of pain this week could not have her leg up on the bed did not get completely relief from dropping it over the bed. She also has a small area on the left lateral malleolus and the left lateral foot necrotic surfaces on all of these wounds. We have been using Iodoflex I attempted to put her in 3 layer compression last week that did not go well. We will be backing off on kerlix Coban this week. 11/15; the ultrasound that I sent the patient for last week showed findings consistent with acute deep vein thrombosis involving the right distal femoral vein and right popliteal vein. I was concerned about the combination of Plavix and Xarelto however apparently Dr. [name] felt that was not a problem. She is now going through the acute dosing of Xarelto. She has an appointment with Dr. Trula Slade on 12/5. 11/29; 10 days ago before he left for weeks vacation she called to report pain in the right leg I sent her in some doxycycline her husband thought things were getting better however she required admission to  hospital from 11/22 through 11/24 prompted by finding that her hemoglobin was 5.7 she was transfused 2 units of packed cells and her hemoglobin stabilized. She did undergo an endoscopy with findings of significant esophagitis part of which was candidal and part of it reflux. She was discharged on nystatin swish and swallow which she is doing. She seems to be better from that regard. There was no active other active source of bleeding Unfortunately the combination of Xarelto and Plavix was felt to be too much for her. The Xarelto was stopped and an IVC filter was placed she is continued on Plavix. Her big  complaint today is pain in the right calf really from the mid aspect superiorly. This is very tender slightly red but not particularly warm. She did see vein and vascular in the hospital I will see if I can pull their note Electronic Signature(s) Signed: 11/28/2020 4:47:57 PM By: Linton Ham MD Entered By: Linton Ham on 11/28/2020 14:07:18 -------------------------------------------------------------------------------- Physical Exam Details Patient Name: Date of Service: [name] [name], [name] [name] F. 11/28/2020 12:30 PM Medical Record Number: [id number] Patient Account Number: [id number] Date of Birth/Sex: Treating RN: [birth year] (69 y.o. [name] [name] Primary Care Provider: Tedra [name] Other Clinician: Referring Provider: Treating Provider/Extender: [name] in Treatment: 30 Constitutional Patient is hypertensive.. Pulse regular and within target range for patient.Marland Kitchen Respirations regular, non-labored and within target range.. Temperature is normal and within the target range for the patient.Marland Kitchen Appears in no distress. Respiratory work of breathing is normal. Cardiovascular Her pedal pulses are palpable on the right foot is not cold. He has a significant degree of localized swelling from the mid part of her tibia towards the tibial plateau. Very tender but not  particularly warm. Psychiatric appears at normal baseline. Notes Wound exam Midportion of her right tibia to the tibial tuberosity very swollen and tender. But not particularly warm there is some erythema. Most of her wounds look about the same she has a large necrotic wound on the right posterior the original wound on the right medial mid tibia right lateral malleolus and the left lateral malleolus. Electronic Signature(s) Signed: 11/28/2020 4:47:57 PM By: Linton Ham MD Entered By: Linton Ham on 11/28/2020 14:12:18 -------------------------------------------------------------------------------- Physician Orders Details Patient Name: Date of Service: [name] [name], [name] [name] F. 11/28/2020 12:30 PM Medical Record Number: [id number] Patient Account Number: [id number] Date of Birth/Sex: Treating RN: 04-Mar-1951 (69 y.o. [name] [name], [name] Primary Care Provider: Tedra [name] Other Clinician: Referring Provider: Treating Provider/Extender: [name] in Treatment: [phone] Verbal / Phone Orders: No Diagnosis Coding Follow-up Appointments Return Appointment in 1 week. Dressing Change Frequency Change Dressing every other day. - all wounds Skin Barriers/Peri-Wound Care Moisturizing lotion - both legs Wound Cleansing May shower with protection. Primary Wound Dressing Wound #16 Right Calcaneus Santyl Ointment Wound #19 Right,Lateral Malleolus Santyl Ointment Wound #20 Left,Lateral Malleolus Santyl Ointment Wound #23 Right,Posterior Lower Leg Santyl Ointment Wound #24 Right,Anterior Lower Leg Santyl Ointment Wound #25 Left,Lateral Foot Santyl Ointment Wound #26 Right,Lateral Lower Leg Santyl Ointment Wound #27 Right,Distal,Anterior Lower Leg Santyl Ointment Wound #5 Right,Medial Lower Leg Santyl Ointment Secondary Dressing Dry Gauze - all wounds Edema Control Kerlix and Coban - Bilateral Avoid standing for long periods of time Elevate legs to the  level of the heart or above for 30 minutes daily and/or when sitting, a frequency of: - throughout the day Off-Loading Turn and reposition every 2 hours Other: - float heels off of bed/chair with pillow under calves Patient Medications llergies: Sulfa (Sulfonamide Antibiotics), Levaquin, penicillin A Notifications Medication Indication Start End cellulitis right leg 11/28/2020 doxycycline monohydrate DOSE oral 100 mg capsule - 1 capsule oral bid for 7 days 11/28/2020 tramadol DOSE 1 - oral 50 mg tablet - 1 tablet oral every 6 hrs as needed for pain Electronic Signature(s) Signed: 11/28/2020 4:47:57 PM By: Linton Ham MD Signed: 11/29/2020 6:12:47 PM By: Levan Hurst RN, BSN Previous Signature: 11/28/2020 2:15:32 PM Version By: Linton Ham MD Entered By: Levan Hurst on 11/28/2020 14:58:26 Prescription 11/28/2020 -------------------------------------------------------------------------------- Elta Guadeloupe MD Patient Name: Provider: [birth year]  [id number] Date of Birth: NPI#Wanda Plump [id number] Sex: DEA #: [phone] [id number] Phone #: License #: [place] Patient Address: [facility] [address] [place], [place] [address] [place], [place] [address] [phone] Allergies Sulfa (Sulfonamide Antibiotics); Levaquin; penicillin Medication Note: This prescription was automatically generated because the medication is a controlled substance, and cannot be prescribed electronically. Medication: Route: Strength: Form: tramadol oral 50 mg tablet Class: OPIOID ANALGESICS Dose: Frequency / Time: Indication: 1 1 tablet oral every 6 hrs as needed for pain Number of Refills: Number of Units: 1 Generic Substitution: Start Date: End Date: Administered at Facility: Substitution Permitted [id number] No Note to Pharmacy: Hand Signature: Date(s): Electronic Signature(s) Signed: 11/28/2020 4:47:57  PM By: Linton Ham MD Signed: 11/29/2020 6:12:47 PM By: Levan Hurst RN, BSN Entered By: Levan Hurst on 11/28/2020 14:58:43 -------------------------------------------------------------------------------- Problem List Details Patient Name: Date of Service: [name] [name], [name] [name] F. 11/28/2020 12:30 PM Medical Record Number: [id number] Patient Account Number: [id number] Date of Birth/Sex: Treating RN: 06-19-1951 (69 y.o. 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 02/24/2015 No Yes I87.331 Chronic venous hypertension (idiopathic) with ulcer and inflammation of right 10/04/2016 No Yes lower extremity L89.616 Pressure-induced deep tissue damage of right heel 12/14/2019 No Yes L97.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 05/31/2020 No Yes I70.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 10/31/2020 No Yes lower leg I70.248 Atherosclerosis of native arteries of left leg with ulceration of other part of 10/31/2020 No Yes lower leg Inactive Problems ICD-10 Code Description Active Date Inactive Date 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 11/28/2020 12:30 PM Medical Record Number: [id number] Patient Account Number: [id number] Date of Birth/Sex: Treating RN: 1951/04/10 (69 y.o. [name] [name] Primary Care Provider: Tedra [name] Other Clinician: Referring Provider: Treating Provider/Extender: [name] in Treatment: 453 Subjective History of Present Illness (HPI) this is a patient who initially came to [place] for wounds on the medial malleoli bilaterally as well as her upper medial lower extremities bilaterally. These wounds eventually healed with assistance of Apligraf's bilaterally. While this was occurring she developed the current wound which opened into a fairly substantial wound on the right lower extremity. These are mostly secondary to venous stasis physiology however the patient also has underlying scleroderma, pulmonary hypertension. The wound has been making good progress lately with the Hydrofera Blue-based dressing. 03/17/2015; patient had a arterial evaluation a year ago. Her right ABI was 0.86 left was 1.0. T brachial index was 0.41 on the right 0.45 on the left. Her oe bilateral common femoral  artery waveforms were triphasic. Her white popliteal posterior tibial artery and anterior tibial artery waveforms were monophasic with good amplitude. Luteal artery waveforms were biphasic it was felt that her bilateral great toe pressures are of normal although adequate for tissue healing. 03/24/2015. The condition of this wound is not really improved that. He was covered with as fibrinous surface slough and eschar. This underwent an aggressive debridement with both a curette and scalpel. I still cannot really get  down to what I can would consider to be a viable surface. There is no evidence of infection. Previous workup for ischemia roughly a year ago was negative nevertheless I think that continues to be a concern 04/07/15. The patient arrived for application of second Apligraf. Once again the surface of this wound is certainly less viable than I would like for an advanced treatment option. An aggressive debridement was done. She developed some arteriolar bleeding which required that along pressure and silver alginate. 04/21/15: change in the condition of this wound. Once again it is covered in a gelatinous surface slough. After debridement today surface of the wound looks somewhat better but now a heeling surface 05/05/15 Apligraf #4 applied.Still a lot of slough on this wound. 05/19/15 Apligraf #5 applied. Still a lot of slough on this wound I did not aggressively remove this 06/02/15; continued copious amounts of surface slough. This debridement fairly easily. 06/08/2015 -- the last time she had a venous duplex study done was over 3 years ago and the surgery was prior to that. I have recommended that she sees Dr. early for a another opinion regarding a repeat venous duplex and possibly more endovenous ablation of vein stripping of micro-phlebotomies. 06/16/15; wound has a gelatinous surface eschar that the debridement fairly easily to a point. I don't disagree with the venous workup and perhaps even arterial re-evaluation. She is on prednisone 5 mg and continue his medications for her pulmonary artery hypertension I am not sure if the latter have any wound care healing issues I would need to investigate this. 06/23/15 continues with a gelatinous surface eschar with of fibrinous underlying. What I can see of this wound does not look unhealthy however I just can't get this material which I think is 2 different layers off. Empiric culture done 06/30/15; unfortunately not a lot of change in this wound. A gelatinous  surface eschar is easily removed however it has a tight fibrinous surface underneath the. 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 The wound comes back and basically and unchanged state the. She has a gelatinous surface that is more easily removed however there is a tightly fibrinous surface underneath the. There is no evidence of infection. She has a vascular follow-up next month. I would have to inject her in order to do a more aggressive debridement of this area 07/21/15 the wound is roughly in the same state albeit the debridement is done with greater ease. There is less of the fibrinous eschar underneath the. There is no evidence of infection. She has follow-up with vascular surgery next week. No evidence of surrounding infection. Her original distal wounds healed while this one formed. 07/28/15; wound is easier to debride. No wound erythema. She is seeing Dr. [name] next week. 08/18/15 Has been to [facility] for repeat ablation. Have been using medihoney pad with some improvement 08/25/15; absolutely no change in the condition of this wound in either its overall size or surface condition in many months now. At one point  I had this down to [place] healthier surface I think with [facility] however this did not actually progress towards closure. Do not believe that the wound has actually deteriorated in terms of volume at all. We have been using a medihoney pad which allows easier debridement of the gelatinous eschar but again no overall actual improvement. the patient is going towards an ablation with pain and vascular which I think is scheduled for next week. The only other investigation that I could first see would be a biopsy. She does have underlying scleroderma 09/02/15 eschar is much easier to debridement however the base of this does not look particularly vibrant. We changed to Iodoflex. The patient had her ablation earlier this week 09/09/15 again the debridement over the  base of this wound is easier and the base of the wound looks considerably better. We will continue the Iodoflex. Dr. Tawni Millers has expressed his satisfaction with the result of her ablation 09/15/15 once again the wound is relatively free of surface eschar. No debridement was done today. It has a pale-looking base to it. although this is not as deep as it once was it seems to be expanding especially inferiorly. She has had recent venous ablation but this is no closer to healing.I've gone ahead and done a punch biopsy this from the inferior part of the wound close to normal skin 09/22/15: the wound is relatively free of surface eschar. There is some surrounding eschar. I'm not exactly sure at what level the surface is that I am seeing. 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 No evidence of atypical infection, malignancy. 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 No debridement was done 09/29/15; the wound surface is relatively free of surface eschar. There is eschar surrounding the walls of the wound. Aside from the improvement in the amount of surface slough. This wound has not progressed any towards closure. There is not even a surface that looks like there at this is ready. There is no evidence of any infection nor maligancy based on biopsy I did on 9/15. I continued with the Iodoflex however I am looking towards some alternative to try to promote some closure or filling in of this surface. Consider triple layer [place]. Collagen did not result in adequate control of the surface slough 10/13/15; the patient was in hospital last week with severe anemia. The wound looks somewhat better after debridement although there is widening medially. There is no evidence of infection. 10/20/15; patient's wound on the right lateral lower leg is essentially unchanged. This underwent a light surface debridement and in general the debridement is easier and the  surface looks improved. I noted in doing this on the side of the wound what appears to be a piece of calcium deposition. The patient noted that she had previous things on the tips of her fingers. 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 She follows with rheumatology and I have asked them to talk to her about this. In view of that S the nonhealing ulcer may have something to do with calcinosis and also unrelieved Raynaud's disease in this area. I should note that her original wounds on the right and left medial malleolus and the inner aspects of both legs just below the knees did however heal over 10/30/15; the patient's wound on the right lateral lower leg is essentially unchanged. I was able to remove some calcified material from the medial wound  edge. 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 Otherwise the wound appears essentially unchanged there is less adherent surface eschar. Some of the calcified material was sent to pathology for analysis 11/17/15. The patient's wound on the right lateral leg is essentially unchanged. Wider Medially. 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 There is some surface slough but I did not debridement this. No evidence of surrounding infection. 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 This will make healing difficult 12/15/15; the wound has less depth surface has a fibrinous slough and calcifications in the wound edge no evidence of infection 12/22/15; the wound definitely has less Fibrinous slough on the base. Calcifications around the wound edges are  still evident. Although the wound bed looks healthier it is still pale in appearance. Previous biopsy did not show malignancy 01/04/15; surgical debridement of nonviable slough and subcutaneous tissue the wound cleans up quite nicely but appears to be expanding outward calcifications around the wound edges are still there. 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 She is to see her rheumatologist I'll see if he has any opinions on this. My punch biopsy done in September did not show calcifications although these are clearly evident. 01/19/16 light selective debridement of nonviable surface slough. There is epithelialization medially. This gives me reason for cautious optimism. She has been to see her rheumatologist, there is nothing that can be done for this type of soft tissue calcification associated with scleroderma 02/02/16 no debridement although there is a light surface slough. She has 2 peninsulas of skin 1 inferiorly and one medially. We continue to make a slight and slow but definite progressive here 02/16/16; light surface debridement with more attention to the circumference of the wound bed where the fibrinous eschar is more prevalent. No calcifications detected. She seems to have done nicely with the [facility] with some epithelialization and some improvement in the overall wound volume. 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 Continued attention to the circumference of the wound where the fibrinous eschar in calcinosis or prevalent. No calcifications were detected. She has continued improvement with Hydrofera Blue. The wound is no longer as deep 03/15/16 surgical debridement done to remove surface escha Especially around the circumference of the wound where there is nonviable subcutaneous tissue. In spite of this there is  considerable improvement in the overall dimensions and depth of the wound. Islands of epithelialization are seen especially medially inferiorly and superiorly to a lesser extent. She is using Hydrofera Blue at home 03/29/16; surgical debridement done to remove surface eschar and nonviable subcutaneous tissue. This cleans up quite nicely mention slightly larger in terms of length and width however depth is less 04/12/16; continued gradual improvement in terms of depth and the condition of the wound base. Debridement is done. Continuing long standing Hydrofera Blue at home with Kerlix Coban wraps 04/26/16; continued gradual improvement in terms of depth and management as well as condition of the wound base. Surgical debridement done she continues with Hydrofera Blue. 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  She initially came to this clinic venous insufficiency ulcers which have long since healed 05/17/16 continued improvement in terms of the depth and measurements of this wound although she has a tightly adherent fibrinous slough each time. We've been continued with long standing Hydrofera Blue which seems to done as well for this wound is many advanced treatment options. 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 She also has chronic venous insufficiency. She has an irritation on her lateral right ankle secondary to our wraps 05/10/16; wound appears to be smaller especially on the medial aspect and especially in the width. Wound was debridement surface looks better. She is also been in the hospital apparently with anemia again she tells me she had an endoscopy. Since she got home after 3 days which I believe was sometime last week she has had an irritated painful area on the right lateral ankle surrounding the lateral malleolus 05/31/16; much more adherent surface slough today then recently although I don't think the dimensions of  changed that much. A more aggressive debridement is required. The irritated area over her medial malleolus is more pruritic and painful and I don't think represents cellulitis 06/14/16; no major change in her wound dimensions however there is more tightly adherent surface slough which is disappointing. As well as she appears to have a new small area medially. Furthermore an irritated uncomfortable area on the lateral aspect of her right foot just below the lateral malleolus. 06/21/16; I'm seeing the patient back in follow-up for the new areas under her major wound on the right anterior leg. She has been using Hydrofera Blue to this area probably for several months now and although the dressings seem to be helping for quite a period of time I think things have stagnated lately. She comes in today with a relatively tight adherent surface slough and really no changes in the wound shape or dimension. The 2 small areas she had inferiorly are tiny but still open they seem improved this well. There is no uncontrolled edema and I don't think there is any evidence of cellulitis. 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 Patient has had vascular evaluations both venous and arterial. I have biopsied this area. There is no obvious infection. The worrisome thing today is that she seems to be developing areas of erythema and epithelial damage on the medial aspect of the right foot. Also to a lesser degree inferior to the actual wound itself. Again I see no obvious changes to suggest cellulitis however as this is the only treatable option I will probably give her antibiotics. 07/13/16 no major change in the lady's large anterior right leg wound. Still covered with a very tightly adherent surface slough which is difficult to debridement. There is less erythema around this, culture last week grew pseudomonas I gave  her ciprofloxacin. The area on her lateral right malleolus looks better- 08/02/16 the patient's wounds continued to decline. Her original large anterior right leg wound looks deeper. Still adherent surface slough that is difficult to debridement. She has a small area just below this and a punched-out wound just below her lateral malleolus. In the meantime she is been in hospital with apparently an upper GI bleed on Plavix and aspirin. She is now just on Plavix she received 3 units of packed cells 08/23/16; since I last saw this 3 weeks ago, the open large  area on her right leg looks about the same syrup. She has a small satellite lesion just underneath this. The area on her medial right ankle is now a deep necrotic wound. I attempted to debridement this however there is just too much pain. 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 She follows with vascular surgery and is scheduled for an angiogram in early September 09/06/16; the patient is going for an arteriogram tomorrow. Her original large wound on the right calf is about the same the satellite lesion underneath it is about the same however the area on her medial ankle is now deeper with exposed tendon. I am no longer attempting to debride these wounds 09/20/16; the patient has undergone a right femoral endarterectomy and Dacron patch angioplasty. This seems to have helped the flow in her right leg. 10/04/16; Arrived today for aggressive debridement of the wounds on her right calf the original wound the one beneath it and a difficult area over her right lateral leg just above the lateral malleolus. 10/25/16; her 3 open wounds are about the same in terms of dimensions however the surface appears a lot healthier post debridement. Using Iodoflex 10/18/16 we have been using Iodoflex to her wounds which she tolerated with some difficulty. 10/11/16; has been using Santyl for a period of  time with some improvement although again very adherent surface slough would prevent any attempted healing this. She has a original wound on the left calf, the satellite underneath that and the most recent wound on the right medial ankle. She has completed revascularization by Dr. early and has had venous ablation earlier. Want to go back to Iodoflex to see if week and get a healthier surface to this wound bed failing this I think she'll need to be taken to the OR and I am prepared to call Dr. Marla Roe to discuss this. She is obviously not a good candidate for general anesthesia however.; 11/08/16; I put her on Iodoflex last time to see if I can get the wound bed any healthier and unfortunately today still had tremendous surface slough. 11/15/16; 4 weeks' worth of Iodoflex with not much improvement. Debridement on the major wounds on her left anterior leg is easier however this does not maintain from week to week. The punched-out area on her medial right ankle 11/29/16; I attempted to change the patient last visit to [facility] however she states this burned and was very uncomfortable therefore we gave her permission to go back to the eye out of complex which she already had at home. Also she noted a lot of pain and swelling on the lateral aspect of her leg before she traveled to [facility] for the holidays, I called her in doxycycline over the phone. This seems to have helped 12/06/16; Wounds unchanged by in large. Using Iodoflex 12/13/16; her wounds today actually looks somewhat better. The area on the right lateral lower leg has reasonably healthy-looking granulation and perhaps as actually filled and a bit. Debridement of the 2 wounds on the medial calf is easier and post debridement appears to have a healthier base. We have referred her to Dr. Migdalia Dk for consideration of operative debridement 12/20/16; we have a quick note from Dr. Merri Ray who feels that the patient  needs to be referred to an academic center/plastic surgeon. This is due to the complexity of the patient's medical issues as it applies to anything in the OR. We  have been using Iodoflex 12/27/16; in general the wounds on the right leg are better in terms of the difficult to remove surface slough. She has been using Iodoflex. She is approved for Apligraf which I anticipated ordering in the next week or 2 when we get a better-looking surface 01/04/16 the deep wounds on the right leg generally look better. Both of them are debrided further surface slough. The area on the lateral right leg was not debrided. She is approved for Apligraf I think I'll probably order this either next week or the week after depending on the surface of the wounds superiorly. We have been using Iodoflex which will continue until then 01/11/16; the deep wounds on the right leg again have a surface slough that requires debridement. I've not been able to get the wound bed on either one of these wounds down to what would be acceptable for an advanced skin stab-like Apligraf. The area on the medial leg has been improving. We have been using hot Iodoflex to all wounds which seems to do the best at at least limiting the nonviable surface 01/24/17; we have continued Iodoflex and all her wound areas. Her debridement Gen. he is easier and the surface underneath this looks viable. Nevertheless these are large area wounds with exposed muscle at least on the anterior parts. We have ordered Apligraf's for 2 weeks from now. The patient will be away next week 02/07/17; the patient was close to have first Apligraf today however we did not order one. I therefore replaced her Iodoflex. She essentially has 3 large punched- out areas on her right anterior leg and right medial ankle. 02/11/17; Apligraf #1 02/25/17; Apligraf #2. In general some improvement in the right medial ankle and right anterior leg wounds. The larger wound medially perhaps some  better 03/11/17; Apligraf #3. In general continued improvement in the right medial ankle and the right anterior leg wounds. 03/25/17 Apligraf #4. In general continued improvement especially on the right medial ankle and the lower 04/08/17; Apligraf #5 in general continued improvement in all wound sites. 04/22/17; post Apligraf #5 her wound beds continued to look a lot better all of them up to the surface of the surrounding skin. Had a caramel-colored slough that I did not debrided in case there is residual Apligraf effect. The wounds are as good as I have seen these looking quite some time. 04/29/17; we applied Garrett and last week after completing her Apligraf. Wounds look as though they've contracted somewhat although they have a nonviable surface which was problematic in the past. Apparently she has been approved for further Apligraf's. We applied Iodosorb today after debridement. 05/06/17; we're fortunate enough today to be able to apply additional Apligraf approved by her insurance. In general all of her wounds look better Apligraf #6 05/24/17; Apligraf #7 continued improvement in all wounds o3 06/10/17 Apligraf #8. Continued improvement in the surface of all wounds. Not much of an improvement in dimensions as I might a follow 06/24/17 Apligraf #9 continued general improvement although not as much change in the wound areas I might of like. She has a new open area on the right anterior lateral ankle very small and superficial. She also has a necrotic wound on the tip of her right index finger probably secondary to severe uncontrolled Raynaud's phenomenon. She is already on sildenafil and already seen her rheumatologist who gave her Keflex. 07/08/17; Apligraf #10. Generalized improvement although she has a small additional wound just medial to the major wound area.  07/22/17; after discussion we decided not to reorder any further Apligraf's although there is been considerable improvement with these it  hasn't been recently. The major wound anteriorly looks better. Smaller wounds beneath this and the more recent one and laterally look about the same. The area on the right lateral lower leg looks about the same 07/29/17 this is a patient who is exceedingly complex. She has advanced scleroderma, crest syndrome including calcinosis, PAD status post revascularization, chronic venous insufficiency status post ablations. She initially presented to this clinic with wounds on her bilateral lower legs however these closed. More recently we have been dealing with a large open area superiorly on the right anterior leg, a smaller wound underneath this and then another one on the right medial lower extremity. These improve significantly with 10 Apligraf applications. Over the last 2-3 weeks we are making good progress with Hydrofera Blue and these seem to be making progress towards closure 08/19/17; wounds continued to make good improvement with Hydrofera Blue and episodic aggressive debridements 08/26/17; still using Hydrofera Blue. Good improvements 09/09/17; using Hydrofera Blue continued improvement. Area on the lateral part of her right leg has only a small remaining open area. The small inferior satellite region is for all intents and purposes closed. Her major wound also is come in in terms of depth and has advancing epithelialization. 09/16/17; using Hydrofera Blue with continued improvement. The smaller satellite wound. We've closed out today along with a new were satellite wound medially. The area on her medial ankle is still open and her major wound is still open but making improvement. All using Hydrofera Blue. Currently 09/30/17; using Hydrofera Blue. Still a small open area on the lateral right ankle area and her original major wound seems to be making gradual and steady improvement. 10/14/17; still using Hydrofera Blue. Still too small open areas on the right lateral ankle. Her original major wound is  horizontal and linear. The most problematic area paradoxically seems to be the area to the medial area wears I thought it would be the lateral. 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 10/28/17; still using Hydrofera Blue. Right lateral ankle has a very small open area superiorly on the most lateral part of the wound. Her original open wound has 2 open areas now separated by normal skin and we've redefined this. 11/11/17; still using Hydrofera Blue area and right lateral ankle continues to have a small open area on mostly the lateral part of the wound. Her original wound has 2 small open areas now separated by a considerable amount of normal skin 11/28/17; the patient called in slightly before Thanksgiving to report pain and erythema above the wound on the right leg. In the past this is responded well to treatment for cellulitis and I gave her over the phone doxycycline. She stated this resulted in fairly abrupt improvement. We have been using Hydrofera Blue for a prolonged period of time to the larger wound anteriorly into the remaining wound on her right right lateral ankle. The latter is just about closed with only a small linear area and the bottom of the [place]. 12/02/17; use endoform and left the dressing on since last visit. There is no tenderness and no evidence of infection. 12/16/17; patient has been using Endoform but not making much progress. The 2 punched out open areas anteriorly which were the reminiscence of her major wound appear deeper. The area on the lateral aspect of her right calf also  appears deeper. Also she has a puzzling tender swelling above her wound on the right leg. This seems larger than last time. Just above her wounds there appears to be some fluctuance in this area it is not erythematous and there is no crepitus 12/30/17; patient has been using Endoform up until last week we used Hydrofera Blue. Ultrasound of the  swelling above her 2 major wounds last time was negative for a fluid collection. I gave her cefaclor for the erythema and tenderness in this area which seems better. Unfortunately both punched out areas anteriorly and the area on her right medial lower leg appear deeper. In fact the lateral of the wounds anteriorly actually looks as though it has exposed tendon and/or muscle sheath. She is not systemically unwell. She is complaining of vaginitis type symptoms presumably Candida from her antibiotics. 01/06/18; we're using santyl. she has 2 punched out areas anteriorly which were initially part of a large wound. Unfortunately medially this is now open to tendon/muscle. All the wounds have the same adherent very difficult to debride surface. 01/20/18; 2 week follow-up using Santyl. She has the 2 punched out areas anteriorly which were initially part of her large surface wound there. Medially this still has exposed muscle. All of these have the same tightly adherent necrotic surface which requires debridement. PuraPly was not accepted by the patient's insurance however her insurance I think it changed therefore we are going to run Apligraf to gain 02/03/18; the patient has been using Santyl. The wound on the right lateral ankle looks improved and the 2 areas anteriorly on the right leg looks about the same. The medial one has exposed muscle. The lateral 1 requiresdebridement. We use PuraPly today for the first week 02/10/18; PuraPly #2. The patient has 3 wounds. The area on the right lateral ankle, 2 areas anteriorly that were part of her original large wound in this area the medial one has exposed muscle. All of the wounds were lightly debrided with a number 3 curet. PuraPly #2 applied the lateral wound on the calf and the right lateral ankle look better. 02/17/18; PuraPly #3. Patient has 3 wounds. The area on the right lateral ankle in 2 areas internally that were part of her original large wound. The lateral  area has exposed muscle. She arrived with some complaints of pain around the right ankle. 02/24/18; PuraPly #4; not much change in any of the 3 wound areas. Right lateral ankle, right lateral calf. Both of these required debridement with a #3 curet. She tolerates this marginally. The area on the medial leg still has exposed muscle. Not much change in dimensions 03/03/18 PuraPly #5. The area on the medial ankle actually looks better however the 2 separate areas that were original parts of the larger right anterior leg wound look as though they're attempting to coalesce. 03/10/18; PuraPly #6. The area on the medial ankle actually continues to look and measures smaller however the 2 separate areas that were part of the original large wound on the right anterior leg have now coalesced. There hasn't been much improvement here. The lateral area actually has underlying exposed muscle 03/17/18-she is here in follow-up evaluation for ulcerations to the right lower extremity. She is voicing no complaints or concerns. She tolerated debridement. Puraply#7 placed 03/24/18; difficult right lower extremity ulcerations. PuraPly #8 place. She is been approved for [employer]. She did very well with Apligraf today however she is apparently reached her "lifetime max" 03/31/18; marginal improvement with PuraPly although her wounds  looked as good as they have in several weeks today. Used TheraSkins #1 04/14/18 TheraSkin #2 today 04/28/18 TheraSkins #3. Wound slightly improved 05/12/18; TheraSkin skin #4. Wound response has been variable. 05/27/18 TheraSkin #5. Generally improvement in all wound areas. I've also put her in 3 layer compression to help with the severe venous hypertension 06/09/18; patient is done quite well with the TheraSkins unfortunately we have no further applications. I also put her in 3 layer compression last week and that really seems to of helped. 06/16/18; we have been using silver collagen. Wounds are  smaller. Still be open area to the muscle layer of her calf however even that is contracted somewhat. She tells me that at night sometimes she has pain on the right lateral calf at the site of her lower wound. Notable that I put her into 3 layer compression about 3 weeks ago. She states that she dangles her leg over the bed that makes it feel better but she does not describe claudication during the day ooShe is going to call her secondary insurance to see if they will continue to cover advanced treatment products I have reviewed her arterial studies from 01/22/17; this showed an ABI in the right of 1 and on the left noncompressible. TBI on the right at 0.30 on the left at 0.34. It is therefore possible she has significant PAD with medial calcification falsely elevating her ABI into the normal range. I'll need to be careful about asking her about this next week it's possible the 3 layer compression is too much 06/23/18; was able to reapply TheraSkin 1 today. Edema control is good and she is not complaining of pain no claudication 07/07/18;no major change. New wound which was apparently a taper removal injury today in our clinic between her 2 wounds on the right calf TheraSkins #2 07/14/18; I think there is some improvement in the right lateral ankle and the medial part of her wound. There is still exposed muscle medially. 07/28/18; two-week follow-up. TheraSkins#3. Unfortunately no major change. 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 She seems to be doing well and in the base of the wound appears to show some progress at this point. This is the last approved Theraskin of the second set. 08/25/18; she has completed TheraSkin. There has been some improvement on the right lateral calf wound as well as the anterior leg  wounds. The open area to muscle medially on the anterior leg wound is smaller. I'm going to transition her back to [facility] under Kerlix Coban change every second day. She reports that she had some calcification removed from her right upper arm. We have had previous problems with calcifications in her wounds on her legs but that has not happened recently 09/08/18;using Hydrofera Blue on both her wound areas. Wounds seem to of contracted nicely. She uses Kerlix Coban wrap and changes at home herself 09/22/2018; using Hydrofera Blue on both her wound areas. Dimensions seem to have come down somewhat. There is certainly less depth in the medial part of the mid tibia wound and I do not think there is any exposed muscle at this point. 10/06/2018; 2-week follow-up. Using [facility] on her wound areas. Dimensions have come down nicely both on the right lateral ankle area in the right mid tibial area. She has no new  complaints 10/20/2018; 2-week follow-up. She is using Hydrofera Blue. Not much change from the last time she was here. The area on the lateral ankle has less depth although it has raised edges on one side. I attempted to remove as much of the raised edge as I could without creating more additional wounds. The area on the right anterior mid tibia area looks the same. 11/03/2018; 2-week follow-up she is using Hydrofera Blue. On the right anterior leg she now has 2 wounds separated by a large area of normal skin. The area on the medial part still has I think exposed muscle although this area itself is a lot smaller. The area laterally has some depth. Both areas with necrotic debris. ooThe area on her right lateral ankle has come in nicely 11/17/2018; patient continues to use Hydrofera Blue. We have been increasing separation of the 2 wounds anteriorly which were at one point joint the area on the right lateral calf continues to have I think some improvement in depth. 12/08/2018; patient  continues to use Hydrofera Blue. There is some improvement in the area on the right lateral calf. The 2 areas that were initially part of the original large wound in the mid right tibia are probably about the same. In fact the medial area is probably somewhat larger. We will run puraply through the patient's insurance 12/22/2018; she has been using Hydrofera Blue. We have small wounds on the right lateral calf and 2 small areas that were initially part of a large wound in the right mid tibia. We applied pure apply #1 today. 12/29/2018; we applied puraply #2. Her wounds look somewhat better especially on the right lateral calf and the lateral part of her original wound in the mid tibial area. 01/05/2019; perhaps slightly improved in terms of wound bed condition but certainly not as much improvement as I might of liked. Puraply #3 1/13: we did not have a correct sized puraply to apply. wounds more pinched out looking, I increased her compression to 3 layer last week to help with significant multilevel venous hypertension. Since then I've reviewed her arterial status. She has a right femoral endarterectomy and a distal left SFA stent. She was being followed by Dr. [name] for a period however she does not appear to have seen him in 3 years. I will set up an appointment. 1/20. The patient has an additional wound on the right lateral calf between the distal wound and proximal wounds. We did not have Puraply last week. Still does not have a follow-up with Dr. early 1/27: Follow-up with Dr. early has been arranged apparently with follow-up noninvasive studies. Wounds are measuring roughly the same although they certainly look smaller 2/3; the patient had non invasive studies. Her ABIs on the right were 0.83 and on the left 1.02 however there was no great toe pressure bilaterally. Also worrisome monophasic waveforms at the PTA and dorsalis pedis. We are still using Puraply. We have had some improvement in all of  the wounds especially the lateral part of the mid tibial area. 2/10; sees Dr. early of vein and vascular re-arterial studies next week. Puraply reapplied today. 2/17; Dr. early of vein and vascular his appointment is tomorrow. Puraply reapplied after debridement of all wounds 2/24; the patient saw Dr. early I reviewed his note. sHe noted the previous right femoral endarterectomy with a Dacron patch. He also noted the normal ABI and the monophasic waveforms suggesting tibial disease. Overall he did not feel that she had any evidence  of arterial insufficiency that would impair her wound healing. He did note her venous disease as well. He suggested PRN follow-up. 3/2; I had the last puraply applied today. The original wounds over the mid tibia area are improved where is the area on the right lower leg is not 3/9; wounds are smaller especially in the right mid tibia perhaps slightly in the right lateral calf. We finished with puraply and went to endoform today 3/23; the patient arrives after 2 weeks. She has been using endoform. I think all of her wounds look slightly better which includes the area on the right lateral calf just above the right lateral malleolus and the 2 in the right mid tibia which were initially part of the same wound. 4/27; TELEHEALTH visit; the patient was seen for telehealth visit today with her consent in the middle of the worldwide epidemic. Since she was last here she called in for antibiotics with pain and tenderness around the area on the right medial ankle. I gave her empiric doxycycline. She states this feels better. She is using endoform on both of these areas 5/11 TELEHEALTH; the patient was seen for telehealth visit today. She was accompanied at home by her husband. She has severe pulmonary hypertension accompanied scleroderma and in the face of the Covid epidemic cannot be safely brought into our clinic. We have been using endoform on her wound areas. There are  essentially 3 wound areas now in the left mid tibia now 2 open areas that it one point were connected and one on the right lateral ankle just above the malleolus. 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 She cannot come to clinic. The wounds were reviewed today via telehealth. She has severe chronic venous hypertension which I think is centrally mediated. She has wounds on her right anterior tibia and right lateral ankle area. These are chronic. She has been using endoform. 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 We have been following her from telehealth. She has severe chronic venous hypertension which may be mostly centrally mediated secondary to right heart heart failure. She has wounds on her right anterior tibia and right lateral ankle these are chronic we have been using endoform 6/22; TELEHEALTH; this patient was seen today via telehealth. 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 Since the last time we had contact with this patient she developed some pain and erythema around the wound on her right lateral malleolus/ankle and we put in antibiotics for her. This is resulted in good improvement with resolution of the erythema and tenderness. I changed her to silver alginate last time. We had been using endoform for an extended period of time 7/13; TELEHEALTH; this patient was seen today via telehealth. 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 She would continue to be at a prohibitive risk to be brought into our clinic unless this was absolutely necessary.  These visits have been done with her approval as well as her husband. We have been using silver alginate to the areas on the right mid tibia and right lateral lower leg. 7/27 TELEHEALTH; patient was seen along  with her husband today via telehealth. 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 We changed her to sample last visit. She has 2 areas a chronic wound on her right mid tibia and one just above her ankle. These were not the original wounds when she came into this clinic but she developed them during treatment 8/17; she comes in for her first face-to-face visit in a long period. She has a remaining area just medial to the right tibia which is the last open part of her large wound across this area. She also has an area on the right lateral lower leg. We prescribed Santyl last telehealth visit but they were concerned that this was making a deeper so they put silver alginate on it last week. Her husband changes the dressings. 8/31; using Santyl to the 2 wound areas some improvement in wound surfaces. Husband has surgery in 2 weeks we will put her out 3 weeks. Any of the advanced treatment options that I can think of that would be eligible for this wound would also cause her to have to come in weekly. The risk that the patient is just too high 9/21. Using Santyl to the 2 wound areas. Both of these are somewhat better although the medial mid tibia area still has exposed muscle. Lateral ankle requiring debridement. Using Santyl 10/12; using Santyl to the 2 wound areas. One on the right lateral ankle and the other in the medial calf which still has exposed muscle. Both areas have come down in size and have better looking surfaces. She has made nice progress with santyl 11/2; we have been using Santyl to the 2 wound areas. Right lateral ankle and the other in the mid tibia area the medial part of this still has  exposed muscle. 11/23/2019 on evaluation today patient appears to be doing about the same really with regard to her wounds. She is actually not very pleased with how things seem to be progressing at this point she tells me that she really has not noted much improvement unfortunately. With that being said there is no signs of active infection at this time. There is some slough buildup noted at this time which again along with some dry skin around the edges of the wound I think would benefit her to try to debride some of this away. Fortunately her pain is doing fairly well. She still has exposed muscle in the right medial/tibial area. 12/14; TELEHEALTH; she was changed to [facility] to the right calf wound and right lateral ankle wound when she was here last time. Unfortunately since then she had a fall with a pelvic fracture and a fracture of her wrist. She was apparently hospitalized for 5 days. I have not looked at her discharge summary. She apparently came out of the hospital with a blister on her right heel. She was seen today via telehealth by myself and our case manager. The patient and her husband were present. She has been using Hydrofera Blue at our direction from the last time she was in the clinic. There is been no major improvement in fact the areas appear deeper and with a less viable surface 01/04/2020; TELEHEALTH; the patient was seen today in accompaniment of her husband and our nurse. She has 3 open areas 1 on the right medial mid tibia, one on the right lateral ankle and a large eschared pressure area on the right heel. We  have been using Iodoflex to the 2 original wounds. 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 It is simply too perilous for her to be seen in any other way 1/26; TELEHEALTH; the patient was seen today in accompaniment of her husband and our RN one of our nurses. She still has the 3 open areas 1 on the right medial mid tibia which is  the remanent of a more extensive wound in this area, one on the right lateral ankle and a large eschared pressure area on the right heel. We have been using Iodoflex to the 2 original wounds and a bed at 9 application to the eschared area on the heel 2/15; the first time we have seen this patient and then several months out of concern for the pandemic. She had a large horizontal wound in the mid tibia. Only the medial aspect of this is still open. Area on the lateral ankle is just about closed. She had a new pressure ulcer on the right heel which I have removed some of the eschar. We have been using Iodoflex which I will continue. The area in the mid tibia has a round circle in the middle of exposed muscle. I think we would have to use an advanced treatment product to stimulate granulation over this area. We will run this through her insurance. She is not eligible for plastic surgery for many reasons 3/1; TELEHEALTH. The patient was seen today by telehealth. 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 We have been using Iodoflex to the wound areas which include the right anterior mid tibia, right lateral ankle and the right heel. All of these were reviewed 3/8; the patient's wound just above the right ankle is closed. She still has a contracting black eschar on the heel where she had a pressure ulcer. The medial part of her original wound on the mid tibia has exposed muscle. We have made really made no progress in this area although we have managed to get a lot of the original wound in this area to close. We used Apligraf #1 today 3/22; the patient's ankle wound remains closed. She still has a contracting black eschar on the heel although it seems to have less surface area. It still not clear whether there is any depth here. We used Apligraf #2 today in an attempt to get granulation over the exposed muscle and  what is left of her mid tibia area. 4/5; everything is closed except the medial aspect of the mid tibia wound, as well as the pressure injury on the right heel. She has been using Betadine to the right heel which has been gradually contracting. We used Apligraf #3 today. 4/19; Apligraf #4. Still a pressure area on the right heel she has been using Betadine superficial excoriated areas she has been applying salicylic acid to on the anterior leg and the thick horn on the leg just above her wound area 5/3; Apligraf #5. Unfortunately she came in today with a reopening of the area on the right lateral lower leg. Not much change in the wound we have been treating in the mid calf. Quite a bit of edema surrounding this wound. She reports that she was up on her feet quite a bit. The area on the right heel is separating she has been using Betadine She comes in with a new wound on the left lateral malleolus which is very disappointing  this is been open since last week 5/17; we applied her last Apligraf last time. Unfortunately that did not have any effect on the deep area medially in the mid tibial area. However the area over the right lateral malleolus is a lot better. Right heel also is contracted we used Iodoflex last time. The new wound on the left lateral malleolus were using Santyl to. This required debridement. 6/1. Not much change in the area on the right medial mid tibia. Right lateral ankle is improved she has the necrotic area on the right heel which was a pressure area. New area on the left lateral malleolus from last time. I changed her to Iodoflex to the area on the left lateral malleolus she said this hurt I have put her back on Santyl 6/15; right mid tibia is unchanged. I do not think there is anything I can do to this topically that we will get this to granulate over the muscle. And I am furthermore I am not sure that she is a surgical candidate either because of her severe pulmonary issues  or because she really does not want to go through it. ooThe area on the right lateral malleolus is a small punched out area. ooThe area on the left lateral malleolus again small punched out with nonviable surface ooPressure ulcer on the right heel at separating black eschar. I went ahead and remove this today Lura Em has an area on the left anterior mid tibia just laterally. Geographic wounds debris on the surface. Some erythema here. 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 There is skin changes widely Change the primary dressing to Sorbact to all wound areas. She needs compression on the left leg as well as the right 6/21; right mid tibia which was her one remaining wound at 1 point is unchanged ooThe area on the right lateral malleolus actually is close to closing over still a small punched out area ooThe area on the left lateral malleolus again a deep wound it is this 1 that she feels pain in. ooPressure ulcer on the right heel was cultured today. ooNew area on the left anterior mid tibia several geographic wounds last week in the setting of chronic venous insufficiency this actually looks some better 7/6; ooRight mid tibia about the same. Exposed muscle ooLeft lateral malleolus again necrotic debris over the surface. ooPressure ulcer on the right heel. She finished the Keflex we prescribed. Necrotic debris debrided with a #3 curette ooThe rest of her wounds on the right mid tibia right lateral ankle are better Her husband reminds me that she had a right femoral endarterectomy and has 2 stents in her left thigh placed in 2011 approximately by vascular surgery in [place]. She saw Dr. [name] last in February 2020. At that point he did not think that the arterial insufficiency she had on the right was sufficient to explain any of her right lower extremity wounds however she now has an area on the left lateral malleolus that looks like an ischemic wound 7/12; we  have been using Sorbact all wound areas ooRight mid tibia about the same exposed muscle ooLeft lateral malleolus small wound with debris in the surface punched out ooPressure ulcer of the right heel requiring debridement of necrotic debris ooLateral ankle wound on the right is just about closed ooRight mid tibia wound about the same still with exposed muscle. 7/26 really no change in any wounds. We have been using Sorbact. She has bilateral lower extremity wounds. This is  in the setting of scleroderma, severe venous hypertension. She also has known PAD and I had really hoped to be able to get a review by Dr. [name] but apparently that is not booked until August 31. I have asked her to call back to vein and vascular and get an appointment with somebody a little earlier if that is possible. 8/16; patient's area on the right ankle which was a chronic wound is closed over again. The area on the right mid lower leg, right heel left lateral ankle are all about the same. Pressure ulcer on the right heel is still not viable. New areas identified on the right lateral calf. She has a follow-up with Dr. [name] on 8/31. Phenomenally I would like him to go over her arterial status with regards to her underlying stents. 9/13; patient had her ARTERIAL studies done however apparently Dr. [name] is now in [place] so she did not get an appointment with him on 8/31 and was not referred to any other provider. On the right side her PTA was monophasic. Noncompressible. TBI on the right at 0.41 on the left she was monophasic and noncompressible they did not do a TBI because she had a Band-Aid on her big toe. Patient does not describe claudication although she is having a lot of pain in the left lateral malleolus. 10/11; the patient had a right-sided interventions on 10/5. She had a angioplasty of the right anterior tibial artery as well as the tibioperoneal trunk/peroneal artery she had a drug-coated balloon  angioplasty of the right superficial femoral artery. On the left she did not have any interventions yet but is going to have another look at this in 2 weeks. The superficial femoral artery on the left and the associated stent are patent popliteal artery is patent to the dominant vessel runoff is the anterior tibial which has several high-grade stenosis in the proximal one third. We have been using Sorbact. She also has a new area on the right upper mid tibia. 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 She tolerated this well. She is using Iodoflex and her husband is doing a kerlix Coban on her. She arrives in clinic today with the original medial part of her mid tibia wound with muscle exposure small area on the right ankle which was part of her original wound she has 2 punched-out areas laterally and 1 above her area anteriorly. Nonviable surfaces similarly the right heel is nonviable. On the left side she has an area on the left foot and the left lateral ankle similar nonviable surfaces. 11/8; patient arrives in clinic today with nothing improved. She has a multiple lightly group of wounds on the right lower leg presumably different etiologies including a skin biopsy, the original wound she had medially and some punched-out areas that are not of clear defined etiology. She says she was in a lot of pain this week could not have her leg up on the bed did not get completely relief from dropping it over the bed. She also has a small area on the left lateral malleolus and the left lateral foot necrotic surfaces on all of these wounds. We have been using Iodoflex I attempted to put her in 3 layer compression last week that did not go well.  We will be backing  off on kerlix Coban this week. 11/15; the ultrasound that I sent the patient for last week showed findings consistent with acute deep vein thrombosis involving the right distal femoral vein and right popliteal vein. I was concerned about the combination of Plavix and Xarelto however apparently Dr. [name] felt that was not a problem. She is now going through the acute dosing of Xarelto. She has an appointment with Dr. Trula Slade on 12/5. 11/29; 10 days ago before he left for weeks vacation she called to report pain in the right leg I sent her in some doxycycline her husband thought things were getting better however she required admission to hospital from 11/22 through 11/24 prompted by finding that her hemoglobin was 5.7 she was transfused 2 units of packed cells and her hemoglobin stabilized. She did undergo an endoscopy with findings of significant esophagitis part of which was candidal and part of it reflux. She was discharged on nystatin swish and swallow which she is doing. She seems to be better from that regard. There was no active other active source of bleeding Unfortunately the combination of Xarelto and Plavix was felt to be too much for her. The Xarelto was stopped and an IVC filter was placed she is continued on Plavix. Her big complaint today is pain in the right calf really from the mid aspect superiorly. This is very tender slightly red but not particularly warm. She did see vein and vascular in the hospital I will see if I can pull their note Objective Constitutional Patient is hypertensive.. Pulse regular and within target range for patient.Marland Kitchen Respirations regular, non-labored and within target range.. Temperature is normal and within the target range for the patient.Marland Kitchen Appears in no distress. Vitals Time Taken: 12:53 PM, Height: 68 in, Weight: 132 lbs, BMI: 20.1, Temperature: 98.1 F, Pulse: 75 bpm, Respiratory Rate: 18 breaths/min, Blood Pressure: 149/59 mmHg. Respiratory work  of breathing is normal. Cardiovascular Her pedal pulses are palpable on the right foot is not cold. He has a significant degree of localized swelling from the mid part of her tibia towards the tibial plateau. Very tender but not particularly warm. Psychiatric appears at normal baseline. General Notes: Wound exam ooMidportion of her right tibia to the tibial tuberosity very swollen and tender. But not particularly warm there is some erythema. ooMost of her wounds look about the same she has a large necrotic wound on the right posterior the original wound on the right medial mid tibia right lateral malleolus and the left lateral malleolus. Integumentary (Hair, Skin) Wound #16 status is Open. Original cause of wound was Pressure Injury. The wound is located on the Right Calcaneus. The wound measures 0.4cm length x 1.1cm width x 0.9cm depth; 0.346cm^2 area and 0.311cm^3 volume. There is Fat Layer (Subcutaneous Tissue) exposed. There is no tunneling or undermining noted. There is a small amount of serosanguineous drainage noted. The wound margin is epibole. There is medium (34-66%) pink, pale granulation within the wound bed. There is a small (1-33%) amount of necrotic tissue within the wound bed including Adherent Slough. Wound #19 status is Open. Original cause of wound was Gradually Appeared. The wound is located on the Right,Lateral Malleolus. The wound measures 0.5cm length x 0.5cm width x 0.1cm depth; 0.196cm^2 area and 0.02cm^3 volume. There is Fat Layer (Subcutaneous Tissue) exposed. There is no tunneling or undermining noted. There is a small amount of serous drainage noted. The wound margin is distinct with the outline attached to the  wound base. There is medium (34-66%) pink granulation within the wound bed. There is a medium (34-66%) amount of necrotic tissue within the wound bed including Adherent Slough. Wound #20 status is Open. Original cause of wound was Gradually Appeared. The wound  is located on the Left,Lateral Malleolus. The wound measures 0.8cm length x 0.8cm width x 0.2cm depth; 0.503cm^2 area and 0.101cm^3 volume. There is Fat Layer (Subcutaneous Tissue) exposed. There is no tunneling or undermining noted. There is a small amount of purulent drainage noted. The wound margin is distinct with the outline attached to the wound base. There is no granulation within the wound bed. There is a large (67-100%) amount of necrotic tissue within the wound bed including Adherent Slough. General Notes: bright green drainage Wound #23 status is Open. Original cause of wound was Gradually Appeared. The wound is located on the Right,Posterior Lower Leg. The wound measures 2cm length x 2.6cm width x 0.6cm depth; 4.084cm^2 area and 2.45cm^3 volume. There is Fat Layer (Subcutaneous Tissue) exposed. There is no tunneling or undermining noted. There is a medium amount of purulent drainage noted. The wound margin is distinct with the outline attached to the wound base. There is no granulation within the wound bed. There is a large (67-100%) amount of necrotic tissue within the wound bed including Adherent Slough. General Notes: bright green drainage Wound #24 status is Open. Original cause of wound was Gradually Appeared. The wound is located on the Right,Anterior Lower Leg. The wound measures 1.5cm length x 0.9cm width x 0.3cm depth; 1.06cm^2 area and 0.318cm^3 volume. There is Fat Layer (Subcutaneous Tissue) exposed. There is no tunneling or undermining noted. There is a small amount of purulent drainage noted. The wound margin is distinct with the outline attached to the wound base. There is no granulation within the wound bed. There is a large (67-100%) amount of necrotic tissue within the wound bed including Adherent Slough. General Notes: bright green drainage Wound #25 status is Open. Original cause of wound was Gradually Appeared. The wound is located on the Left,Lateral Foot. The wound  measures 0.4cm length x 0.5cm width x 0.1cm depth; 0.157cm^2 area and 0.016cm^3 volume. There is Fat Layer (Subcutaneous Tissue) exposed. There is no tunneling or undermining noted. There is a small amount of serous drainage noted. The wound margin is distinct with the outline attached to the wound base. There is no granulation within the wound bed. There is a large (67-100%) amount of necrotic tissue within the wound bed including Adherent Slough. Wound #26 status is Open. Original cause of wound was Gradually Appeared. The wound is located on the Right,Lateral Lower Leg. The wound measures 1.3cm length x 0.6cm width x 0.1cm depth; 0.613cm^2 area and 0.061cm^3 volume. There is Fat Layer (Subcutaneous Tissue) exposed. There is no tunneling or undermining noted. There is a medium amount of purulent drainage noted. The wound margin is flat and intact. There is no granulation within the wound bed. There is a large (67-100%) amount of necrotic tissue within the wound bed including Adherent Slough. General Notes: bright green drainage Wound #27 status is Open. Original cause of wound was Gradually Appeared. The wound is located on the Right,Distal,Anterior Lower Leg. The wound measures 0.7cm length x 0.6cm width x 0.1cm depth; 0.33cm^2 area and 0.033cm^3 volume. There is Fat Layer (Subcutaneous Tissue) exposed. There is no tunneling or undermining noted. There is a small amount of purulent drainage noted. The wound margin is flat and intact. There is no granulation within the  wound bed. There is a large (67-100%) amount of necrotic tissue within the wound bed including Adherent Slough. General Notes: bright green drainage Wound #5 status is Open. Original cause of wound was Gradually Appeared. The wound is located on the Right,Medial Lower Leg. The wound measures 1.8cm length x 2.4cm width x 0.3cm depth; 3.393cm^2 area and 1.018cm^3 volume. There is Fat Layer (Subcutaneous Tissue) exposed. There is no  tunneling or undermining noted. There is a medium amount of purulent drainage noted. The wound margin is distinct with the outline attached to the wound base. There is medium (34-66%) pink granulation within the wound bed. There is a medium (34-66%) amount of necrotic tissue within the wound bed including Adherent Slough. 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 There was a Chemical/Enzymatic/Mechanical debridement performed by Deon Pilling, RN. With the following instrument(s): Forceps after achieving pain control using Lidocaine 4% T opical Solution. Agent used was [employer]. A time out was conducted at 14:02, prior to the start of the procedure. A Minimum amount of bleeding was controlled with Pressure. The procedure was tolerated well with a pain level of 0 throughout and a pain level of 0 following the procedure. Post Debridement Measurements: 0.4cm length x 1.1cm width x 0.9cm depth; 0.311cm^3 volume. Post debridement Stage noted as Category/Stage III. Character of Wound/Ulcer Post Debridement is improved. Post procedure Diagnosis Wound #16: Same as Pre-Procedure Plan Follow-up Appointments: Return Appointment in 1 week. Dressing  Change Frequency: Change Dressing every other day. - all wounds Skin Barriers/Peri-Wound Care: Moisturizing lotion - both legs Wound Cleansing: May shower with protection. Primary Wound Dressing: Wound #16 Right Calcaneus: Santyl Ointment Wound #19 Right,Lateral Malleolus: Santyl Ointment Wound #20 Left,Lateral Malleolus: Santyl Ointment Wound #23 Right,Posterior Lower Leg: Santyl Ointment Wound #24 Right,Anterior Lower Leg: Santyl Ointment Wound #25 Left,Lateral Foot: Santyl Ointment Wound #26 Right,Lateral Lower Leg: Santyl Ointment Wound #27 Right,Distal,Anterior Lower Leg: Santyl Ointment Wound #5 Right,Medial Lower Leg: Santyl Ointment Secondary Dressing: Dry Gauze - all wounds Edema Control: Kerlix and Coban - Bilateral Avoid standing for long periods of time Elevate legs to the level of the heart or above for 30 minutes daily and/or when sitting, a frequency of: - throughout the day Off-Loading: Turn and reposition every 2 hours Other: - float heels off of bed/chair with pillow under calves The following medication(s) was prescribed: doxycycline monohydrate oral 100 mg capsule 1 capsule oral bid for 7 days for cellulitis right leg starting 11/28/2020 1. All of her wounds look about the same. Same nonviable surface I have continued Santyl her husband is changing the compression which is kerlix Coban 2. The real puzzle here is the swelling in the right calf where most of her pain is. I think this may be extension of her DVT rather than cellulitis although I am going to change  the Keflex she was discharged on to back to doxycycline which seemed to have helped originally. 3. I had some thoughts about increasing her compression to 3 layer based on the fact I could feel her pulses but I would like to give her a course of antibiotics first 4. She has a follow-up with Dr. Trula Slade next week. She says she was seen by vascular in the hospital but I did not see a note. There were  no imaging studies of her leg 5. Finally she has a lot of pain in this right leg. I wonder whether this is cellulitis but I really think not. I think this may be extension of a DVT I do not think there is any arterial issue here as I feel her pulses in her feet. I gave her a prescription for tramadol as the oxycodone they gave her on discharge from the hospital because of what sounded to be acute delirium. I also told her that she could split the oxycodone [5 mg tabs] and she might be able to tolerate this Electronic Signature(s) Signed: 11/28/2020 4:47:57 PM By: Linton Ham MD Previous Signature: 11/28/2020 2:15:54 PM Version By: Linton Ham MD Entered By: Linton Ham on 11/28/2020 14:26:13 -------------------------------------------------------------------------------- SuperBill Details Patient Name: Date of Service: [name] [name] 11/28/2020 Medical Record Number: [id number] Patient Account Number: [id number] Date of Birth/Sex: Treating RN: 08/06/1951 (69 y.o. 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 I87.331 Chronic venous hypertension (idiopathic) with ulcer and inflammation of right lower extremity L89.616 Pressure-induced deep tissue damage of right heel L97.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 I70.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 I70.248 Atherosclerosis of native arteries of left leg with ulceration of other part of lower leg Facility  Procedures CPT4 Code: [id number] Description: 34037 - DEBRIDE W/O ANES NON SELECT Modifier: Quantity: 1 Physician Procedures : CPT4 Code Description Modifier [id number] - WC PHYS LEVEL 4 - EST PT ICD-10 Diagnosis Description I70.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

## 2020-11-30 NOTE — Progress Notes (Signed)
[name] [name], [name] [name] ([id number]) Visit Report for 11/28/2020 Arrival Information Details Patient Name: Date of Service: [name] 11/28/2020 12:30 PM Medical Record Number: [id number] Patient Account Number: [id number] Date of Birth/Sex: Treating RN: [birth year] (69 y.o. [name] [name] Primary Care [name] [name]: [name] [name] Other Clinician: Referring [name] [name]: Treating [name] [name]: [name] [name] in Treatment: 45 Visit Information History Since Last Visit Added or deleted any medications: No Patient Arrived: Wheel Chair Any new allergies or adverse reactions: No Arrival Time: 12:53 Had a fall or experienced change in No Accompanied By: husband activities of daily living that may affect Transfer Assistance: None risk of falls: Patient Identification Verified: Yes Signs or symptoms of abuse/neglect since last visito No Secondary Verification Process Completed: Yes Hospitalized since last visit: No Patient Requires Transmission-Based Precautions: No Implantable device outside of the clinic excluding No Patient Has Alerts: Yes cellular tissue based products placed in the center Patient Alerts: R ABI: [place] TBI: 0.41 since last visit: L ABI: [place] (07/2020) Has Dressing in Place as Prescribed: Yes Pain Present Now: Yes Electronic Signature(s) Signed: 11/30/2020 9:59:16 AM By: [name] [name] Entered By: [name] [name] on 11/28/2020 12:53:55 -------------------------------------------------------------------------------- Encounter Discharge Information Details Patient Name: Date of Service: [name] 11/28/2020 12:30 PM Medical Record Number: [id number] Patient Account Number: [id number] Date of Birth/Sex: Treating RN: 1951-06-02 (69 y.o. [name] [name] Primary Care [name] [name]: [name] [name] Other Clinician: Referring [name] [name]: Treating [name] [name]: [name] [name] in Treatment: [phone] Encounter Discharge Information  Items Post Procedure Vitals Discharge Condition: Stable Temperature (F): 98.1 Ambulatory Status: Wheelchair Pulse (bpm): 75 Discharge Destination: Home Respiratory Rate (breaths/min): 18 Transportation: Private Auto Blood Pressure (mmHg): 149/59 Accompanied By: husband Schedule Follow-up Appointment: Yes Clinical Summary of Care: Electronic Signature(s) Signed: 11/28/2020 5:09:36 PM By: [name] [name] Entered By: [name] [name] on 11/28/2020 14:23:04 -------------------------------------------------------------------------------- Lower Extremity Assessment Details Patient Name: Date of Service: [name] 11/28/2020 12:30 PM Medical Record Number: [id number] Patient Account Number: [id number] Date of Birth/Sex: Treating RN: 07/13/1951 (69 y.o. [name] [name] Primary Care [name] [name]: [name] [name] Other Clinician: Referring Shacara Cozine: Treating [name] [name]: [name] [name] in Treatment: 453 Edema Assessment Assessed: [name] [name]: No] [name] [name]: No] Edema: [Left: Yes] [Right: Yes] Calf Left: Right: Point of Measurement: 36 cm From Medial Instep 31.8 cm 38 cm Ankle Left: Right: Point of Measurement: 10 cm From Medial Instep 22.4 cm 23 cm Vascular Assessment Pulses: Dorsalis Pedis Palpable: [Left:No] [Right:No] Electronic Signature(s) Signed: 11/28/2020 4:43:25 PM By: [name] Gouty RN, BSN Entered By: [name] [name] on 11/28/2020 13:30:45 -------------------------------------------------------------------------------- Multi Wound Chart Details Patient Name: Date of Service: [name] 11/28/2020 12:30 PM Medical Record Number: [id number] Patient Account Number: [id number] Date of Birth/Sex: Treating RN: 1951/06/23 (69 y.o. 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] Primary Etiology: [16:Anemia, Hypertension, Peripheral] [19:Anemia, Hypertension, Peripheral] [20:Anemia, Hypertension, Peripheral] Comorbid History: [16:Arterial Disease, Peripheral Venous Disease, Raynauds, Scleroderma, Rheumatoid Arthritis, Osteoarthritis 01/04/2020] [19:Arterial Disease, Peripheral Venous Disease, Raynauds, Scleroderma, Rheumatoid Arthritis, Osteoarthritis  05/02/2020] [20:Arterial Disease, Peripheral Venous  Disease, Raynauds, Scleroderma, Rheumatoid Arthritis, Osteoarthritis 05/02/2020] Date Acquired: [16:47] [19:30] [20:30] Weeks of Treatment: [16:Open] [19:Open] [20:Open] Wound Status: [16:No] [19:No] [20:No] Clustered Wound: [16:0.4x1.1x0.9] [19:0.5x0.5x0.1] [20:0.8x0.8x0.2] Measurements L x W x D (cm) [16:0.346] [19:0.196] [20:0.503] A (cm) : rea [16:0.311] [19:0.02] [20:0.101] Volume (cm) : [16:97.20%] [19:-39.00%] [20:-14.30%] % Reduction in Area: [16:75.30%] [19:28.60%] [20:-14.80%] % Reduction in Volume: [16:Category/Stage III] [19:Full Thickness Without Exposed] [20:Full Thickness Without Exposed] Classification: [16:Small] [19:Support Structures Small] [20:Support Structures Small] Exudate A mount: [16:Serosanguineous] [19:Serous] [20:Purulent] Exudate Type: [16:red, brown] [19:amber] [20:yellow, brown, green] Exudate Color: [16:Epibole] [19:Distinct, outline attached] [20:Distinct, outline attached] Wound Margin: [16:Medium (34-66%)] [19:Medium (34-66%)] [20:None Present (0%)] Granulation A mount: [16:Pink, Pale] [19:Pink]  [20:N/A] Granulation Quality: [16:Small (1-33%)] [19:Medium (34-66%)] [20:Large (67-100%)] Necrotic A mount: [16:Fat Layer (Subcutaneous Tissue): Yes Fat Layer (Subcutaneous Tissue): Yes Fat Layer (Subcutaneous Tissue): Yes] Exposed Structures: [16:Fascia: No Tendon: No Muscle: No Joint: No Bone: No Medium (34-66%)] [19:Fascia: No Tendon: No Muscle: No Joint: No Bone: No Small (1-33%)] [20:Fascia: No Tendon: No Muscle: No Joint: No Bone: No None] Epithelialization: [16:Chemical/Enzymatic/Mechanical] [19:N/A] [20:N/A] Debridement: Pre-procedure Verification/Time Out 14:02 [19:N/A] [20:N/A] Taken: [16:Lidocaine 4% Topical Solution] [19:N/A] [20:N/A] Pain Control: [16:Forceps] [19:N/A] [20:N/A] Instrument: [16:Minimum] [19:N/A] [20:N/A] Bleeding: [16:Pressure] [19:N/A] [20:N/A] Hemostasis A chieved: [16:0] [19:N/A] [20:N/A] Procedural Pain: [16:0] [19:N/A] [20:N/A] Post Procedural Pain: [16:Procedure was tolerated well] [19:N/A] [20:N/A] Debridement Treatment Response: [16:0.4x1.1x0.9] [19:N/A] [20:N/A] Post Debridement Measurements L x W x D (cm) [16:0.311] [19:N/A] [20:N/A] Post Debridement Volume: (cm) [16:Category/Stage III] [19:N/A] [20:N/A] Post Debridement Stage: [16:N/A] [19:N/A] [20:bright green drainage] Assessment Notes: [16:Debridement] [19:N/A] [20:N/A] Wound Number: _0 Photos: No Photos No Photos No Photos Right, Posterior Lower Leg Right, Anterior Lower Leg Left, Lateral Foot Wound Location: Gradually Appeared Gradually Appeared Gradually Appeared 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 Primary Etiology: Anemia, Hypertension, Peripheral Anemia, Hypertension, Peripheral Anemia, Hypertension, Peripheral Comorbid History: Arterial Disease, Peripheral Venous Arterial Disease, Peripheral Venous Arterial Disease, Peripheral Venous Disease, Raynauds, Scleroderma, Disease, Raynauds, Scleroderma, Disease, Raynauds, Scleroderma, Rheumatoid  Arthritis, Osteoarthritis Rheumatoid Arthritis, Osteoarthritis Rheumatoid Arthritis, Osteoarthritis 08/15/2020 10/10/2020 10/10/2020 Date Acquired: _1 Weeks of Treatment: Open Open Open Wound Status: No No No Clustered Wound: 2x2.6x0.6 1.5x0.9x0.3 0.4x0.5x0.1 Measurements L x W x D (cm) 4.084 1.06 0.157 A (cm) : rea 2.45 0.318 0.016 Volume (cm) : -263.70% 12.40% 58.40% % Reduction in A rea: -2087.50% 12.40% 57.90% % Reduction in Volume: Full Thickness Without Exposed Full Thickness Without Exposed Full Thickness Without Exposed Classification: Support Structures Support Structures Support Structures Medium Small Small Exudate A mount: Purulent Purulent Serous Exudate Type: yellow, brown, green yellow, brown, green amber Exudate Color: Distinct, outline attached Distinct, outline attached Distinct, outline attached Wound Margin: None Present (0%) None Present (0%) None Present (0%) Granulation A mount: N/A N/A N/A Granulation Quality: Large (67-100%) Large (67-100%) Large (67-100%) Necrotic A mount: Fat Layer (Subcutaneous Tissue): Yes Fat Layer (Subcutaneous Tissue): Yes Fat Layer (Subcutaneous Tissue): Yes Exposed Structures: Fascia: No Fascia: No Fascia: No Tendon: No Tendon: No Tendon: No Muscle: No Muscle: No Muscle: No Joint: No Joint: No Joint: No Bone: No Bone: No Bone: No None None Small (1-33%) Epithelialization: N/A N/A N/A Debridement: N/A N/A N/A Pain Control: N/A N/A N/A Instrument: N/A N/A N/A Bleeding: N/A N/A N/A Hemostasis A chieved: N/A N/A N/A Procedural Pain: N/A N/A N/A Post Procedural Pain: Debridement Treatment Response: N/A N/A N/A Post Debridement Measurements L x N/A N/A N/A W x D (cm) N/A N/A  N/A Post Debridement Volume: (cm) N/A N/A N/A Post Debridement Stage: bright green drainage bright green drainage N/A Assessment Notes: N/A N/A N/A Procedures Performed: Wound Number: _0 Photos: No Photos No  Photos No Photos Right, Lateral Lower Leg Right, Distal, Anterior Lower Leg Right, Medial Lower Leg Wound Location: Gradually Appeared Gradually Appeared Gradually Appeared 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 Primary Etiology: Anemia, Hypertension, Peripheral Anemia, Hypertension, Peripheral Anemia, Hypertension, Peripheral Comorbid History: Arterial Disease, Peripheral Venous Arterial Disease, Peripheral Venous Arterial Disease, Peripheral Venous Disease, Raynauds, Scleroderma, Disease, Raynauds, Scleroderma, Disease, Raynauds, Scleroderma, Rheumatoid Arthritis, Osteoarthritis Rheumatoid Arthritis, Osteoarthritis Rheumatoid Arthritis, Osteoarthritis 10/27/2020 11/07/2020 01/13/2013 Date Acquired: 4 3 409 Weeks of Treatment: Open Open Open Wound Status: No No Yes Clustered Wound: 1.3x0.6x0.1 0.7x0.6x0.1 1.8x2.4x0.3 Measurements L x W x D (cm) 0.613 0.33 3.393 A (cm) : rea 0.061 0.033 1.018 Volume (cm) : -23.80% -16.60% -140.00% % Reduction in A rea: 58.80% -17.90% -622.00% % Reduction in Volume: Full Thickness Without Exposed Full Thickness Without Exposed Full Thickness With Exposed Support Classification: Support Structures Support Structures Structures Medium Small Medium Exudate A mount: Purulent Purulent Purulent Exudate Type: yellow, brown, green yellow, brown, green yellow, brown, green Exudate Color: Flat and Intact Flat and Intact Distinct, outline attached Wound Margin: None Present (0%) None Present (0%) Medium (34-66%) Granulation A mount: N/A N/A Pink Granulation Quality: Large (67-100%) Large (67-100%) Medium (34-66%) Necrotic A mount: Fat Layer (Subcutaneous Tissue): Yes Fat Layer (Subcutaneous Tissue): Yes Fat Layer (Subcutaneous Tissue): Yes Exposed Structures: Fascia: No Fascia: No Fascia: No Tendon: No Tendon: No Tendon: No Muscle: No Muscle: No Muscle: No Joint: No Joint: No Joint: No Bone: No Bone:  No Bone: No None Small (1-33%) Small (1-33%) Epithelialization: N/A N/A N/A Debridement: N/A N/A N/A Pain Control: N/A N/A N/A Instrument: N/A N/A N/A Bleeding: N/A N/A N/A Hemostasis A chieved: N/A N/A N/A Procedural Pain: N/A N/A N/A Post Procedural Pain: Debridement Treatment Response: N/A N/A N/A Post Debridement Measurements L x N/A N/A N/A W x D (cm) N/A N/A N/A Post Debridement Volume: (cm) N/A N/A N/A Post Debridement Stage: bright green drainage bright green drainage bright green drainage Assessment Notes: N/A N/A N/A Procedures Performed: Treatment Notes Electronic Signature(s) Signed: 11/28/2020 4:47:57 PM By: Linton Ham MD Signed: 11/29/2020 6:12:47 PM By: Levan Hurst RN, BSN Entered By: Linton Ham on 11/28/2020 14:04:03 -------------------------------------------------------------------------------- Multi-Disciplinary Care Plan Details Patient Name: Date of Service: BLA [name] [name], [name] 11/28/2020 12:30 PM Medical Record Number: [id number] Patient Account Number: [id number] Date of Birth/Sex: Treating RN: Jun 17, 1951 (69 y.o. [name], [name] Primary Care Jaylynne Birkhead: [name] [name] Other Clinician: Referring Anabelle Bungert: Treating Damien Batty/Extender: [name] [name] in Treatment: [phone] Active Inactive Wound/Skin Impairment Nursing Diagnoses: Impaired tissue integrity Knowledge deficit related to ulceration/compromised skin integrity Goals: Patient/caregiver will verbalize understanding of skin care regimen Date Initiated: 10/11/2016 Target Resolution Date: 12/09/2020 Goal Status: Active Interventions: Assess patient/caregiver ability to obtain necessary supplies Assess patient/caregiver ability to perform ulcer/skin care regimen upon admission and as needed Assess ulceration(s) every visit Provide education on ulcer and skin care Treatment Activities: Skin care regimen initiated : 10/11/2016 Topical wound management  initiated : 10/11/2016 Notes: Electronic Signature(s) Signed: 11/28/2020 5:13:46 PM By: Rhae Hammock RN Entered By: Rhae Hammock on 11/28/2020 13:57:10 -------------------------------------------------------------------------------- Pain Assessment Details Patient Name: Date of Service: [name] 11/28/2020 12:30 PM Medical Record Number: [id number] Patient Account Number: [id number] Date of Birth/Sex:  Treating RN: 1951/11/21 (69 y.o. [name] [name] Primary Care Laneya Gasaway: [name] [name] Other Clinician: Referring Hutchinson Isenberg: Treating Dominique Calvey/Extender: [name] [name] in Treatment: [phone] Active Problems Location of Pain Severity and Description of Pain Patient Has Paino Yes Site Locations Rate the pain. Current Pain Level: 10 Pain Management and Medication Current Pain Management: Electronic Signature(s) Signed: 11/29/2020 6:12:47 PM By: Levan Hurst RN, BSN Signed: 11/30/2020 9:59:16 AM By: [name] [name] Signed: 11/30/2020 9:59:16 AM By: [name] [name] Entered By: [name] [name] on 11/28/2020 12:54:26 -------------------------------------------------------------------------------- Patient/Caregiver Education Details Patient Name: Date of Service: [name] 11/29/2021andnbsp12:30 PM Medical Record Number: [id number] Patient Account Number: [id number] Date of Birth/Gender: Treating RN: 09/14/1951 (69 y.o. [name], [name] Primary Care Physician: [name] [name] Other Clinician: Referring Physician: Treating Physician/Extender: [name] [name] in Treatment: [phone] Education Assessment Education Provided To: Patient Education Topics Provided Wound/Skin Impairment: Methods: Explain/Verbal Responses: Reinforcements needed Electronic Signature(s) Signed: 11/28/2020 5:13:46 PM By: Rhae Hammock RN Entered By: Rhae Hammock on 11/28/2020  13:57:31 -------------------------------------------------------------------------------- Wound Assessment Details Patient Name: Date of Service: [name] 11/28/2020 12:30 PM Medical Record Number: [id number] Patient Account Number: [id number] Date of Birth/Sex: Treating RN: 08/04/1951 (69 y.o. [name] [name] Primary Care Patton Rabinovich: [name] [name] Other Clinician: Referring Mischelle Reeg: Treating Destinee Taber/Extender: [name] [name] in Treatment: [place] Wound Status Wound Number: 16 Primary Pressure Ulcer Etiology: Wound Location: Right Calcaneus Wound Open Wounding Event: Pressure Injury Status: Date Acquired: 01/04/2020 Comorbid Anemia, Hypertension, Peripheral Arterial Disease, Peripheral Weeks Of Treatment: 47 History: Venous Disease, Raynauds, Scleroderma, Rheumatoid Arthritis, Clustered Wound: No Osteoarthritis Wound Measurements Length: (cm) 0.4 Width: (cm) 1.1 Depth: (cm) 0.9 Area: (cm) 0.346 Volume: (cm) 0.311 % Reduction in Area: 97.2% % Reduction in Volume: 75.3% Epithelialization: Medium (34-66%) Tunneling: No Undermining: No Wound Description Classification: Category/Stage III Wound Margin: Epibole Exudate Amount: Small Exudate Type: Serosanguineous Exudate Color: red, brown Wound Bed Granulation Amount: Medium (34-66%) Granulation Quality: Pink, Pale Necrotic Amount: Small (1-33%) Necrotic Quality: Adherent Slough Foul Odor After Cleansing: No Slough/Fibrino Yes Exposed Structure Fascia Exposed: No Fat Layer (Subcutaneous Tissue) Exposed: Yes Tendon Exposed: No Muscle Exposed: No Joint Exposed: No Bone Exposed: No Treatment Notes Wound #16 (Right Calcaneus) 1. Cleanse With Wound Cleanser Soap and water 2. Periwound Care Moisturizing lotion 3. Primary Dressing Applied Santyl 4. Secondary Dressing Dry Gauze 6. Support Layer Holiday representative) Signed: 11/28/2020 4:43:25 PM By: [name] Gouty RN, BSN Signed: 11/29/2020 6:12:47 PM By: Levan Hurst RN, BSN Entered By: [name] [name] on 11/28/2020 13:32:13 -------------------------------------------------------------------------------- Wound Assessment Details Patient Name: Date of Service: BLA [name] [name], [name] 11/28/2020 12:30 PM Medical Record Number: [id number] Patient Account Number: [id number] Date of Birth/Sex: Treating RN: 04-Jul-1951 (69 y.o. [name] [name] Primary Care Janat Tabbert: [name] [name] Other Clinician: Referring Gretchen Weinfeld: Treating Buffy Ehler/Extender: [name] [name] in Treatment: 453 Wound Status Wound Number: 19 Primary Venous Leg Ulcer Etiology: Wound Location: Right, Lateral Malleolus Wound Open Wounding Event: Gradually Appeared Status: Date Acquired: 05/02/2020 Comorbid Anemia, Hypertension, Peripheral Arterial Disease, Peripheral Weeks Of Treatment: 30 History: Venous Disease, Raynauds, Scleroderma, Rheumatoid Arthritis, Clustered Wound: No Osteoarthritis Wound Measurements Length: (cm) 0.5 Width: (cm) 0.5 Depth: (cm) 0.1 Area: (cm) 0.196 Volume: (cm) 0.02 % Reduction in Area: -39% % Reduction in Volume: 28.6% Epithelialization: Small (1-33%) Tunneling: No Undermining: No Wound Description Classification: Full Thickness Without Exposed Support Structures Wound Margin: Distinct, outline attached Exudate Amount: Small Exudate Type:  Serous Exudate Color: amber Foul Odor After Cleansing: No Slough/Fibrino Yes Wound Bed Granulation Amount: Medium (34-66%) Exposed Structure Granulation Quality: Pink Fascia Exposed: No Necrotic Amount: Medium (34-66%) Fat Layer (Subcutaneous Tissue) Exposed: Yes Necrotic Quality: Adherent Slough Tendon Exposed: No Muscle Exposed: No Joint Exposed: No Bone Exposed: No Treatment Notes Wound #19 (Right, Lateral Malleolus) 1. Cleanse With Wound Cleanser Soap and water 2. Periwound Care Moisturizing lotion 3. Primary  Dressing Applied Santyl 4. Secondary Dressing Dry Gauze 6. Support Layer Holiday representative) Signed: 11/28/2020 4:43:25 PM By: [name] Gouty RN, BSN Signed: 11/29/2020 6:12:47 PM By: Levan Hurst RN, BSN Entered By: [name] [name] on 11/28/2020 13:33:32 -------------------------------------------------------------------------------- Wound Assessment Details Patient Name: Date of Service: BLA [name] [name], [name] 11/28/2020 12:30 PM Medical Record Number: [id number] Patient Account Number: [id number] Date of Birth/Sex: Treating RN: 10-Jan-1951 (69 y.o. [name] [name] Primary Care Gwenetta Devos: [name] [name] Other Clinician: Referring Renda Pohlman: Treating Beatryce Colombo/Extender: [name] [name] in Treatment: [place] Wound Status Wound Number: 20 Primary Arterial Insufficiency Ulcer Etiology: Wound Location: Left, Lateral Malleolus Wound Open Wounding Event: Gradually Appeared Status: Date Acquired: 05/02/2020 Comorbid Anemia, Hypertension, Peripheral Arterial Disease, Peripheral Weeks Of Treatment: 30 History: Venous Disease, Raynauds, Scleroderma, Rheumatoid Arthritis, Clustered Wound: No Osteoarthritis Photos Photo Uploaded By: Mikeal Hawthorne on 11/29/2020 14:47:12 Wound Measurements Length: (cm) 0.8 Width: (cm) 0.8 Depth: (cm) 0.2 Area: (cm) 0.503 Volume: (cm) 0.101 % Reduction in Area: -14.3% % Reduction in Volume: -14.8% Epithelialization: None Tunneling: No Undermining: No Wound Description Classification: Full Thickness Without Exposed Support Structures Wound Margin: Distinct, outline attached Exudate Amount: Small Exudate Type: Purulent Exudate Color: yellow, brown, green Foul Odor After Cleansing: No Slough/Fibrino Yes Wound Bed Granulation Amount: None Present (0%) Exposed Structure Necrotic Amount: Large (67-100%) Fascia Exposed: No Necrotic Quality: Adherent Slough Fat Layer (Subcutaneous Tissue) Exposed:  Yes Tendon Exposed: No Muscle Exposed: No Joint Exposed: No Bone Exposed: No Assessment Notes bright green drainage Treatment Notes Wound #20 (Left, Lateral Malleolus) 1. Cleanse With Wound Cleanser Soap and water 2. Periwound Care Moisturizing lotion 3. Primary Dressing Applied Santyl 4. Secondary Dressing Dry Gauze 6. Support Layer Holiday representative) Signed: 11/28/2020 4:43:25 PM By: [name] Gouty RN, BSN Signed: 11/29/2020 6:12:47 PM By: Levan Hurst RN, BSN Entered By: [name] [name] on 11/28/2020 13:34:42 -------------------------------------------------------------------------------- Wound Assessment Details Patient Name: Date of Service: BLA [name] [name], [name] 11/28/2020 12:30 PM Medical Record Number: [id number] Patient Account Number: [id number] Date of Birth/Sex: Treating RN: 1951/10/21 (69 y.o. [name] [name] Primary Care Tirrell Buchberger: [name] [name] Other Clinician: Referring Aeon Kessner: Treating Devynn Hessler/Extender: [name] [name] in Treatment: [place] Wound Status Wound Number: 23 Primary Venous Leg Ulcer Etiology: Wound Location: Right, Posterior Lower Leg Wound Open Wounding Event: Gradually Appeared Status: Date Acquired: 08/15/2020 Comorbid Anemia, Hypertension, Peripheral Arterial Disease, Peripheral Weeks Of Treatment: 15 History: Venous Disease, Raynauds, Scleroderma, Rheumatoid Arthritis, Clustered Wound: No Osteoarthritis Wound Measurements Length: (cm) 2 Width: (cm) 2.6 Depth: (cm) 0.6 Area: (cm) 4.084 Volume: (cm) 2.45 % Reduction in Area: -263.7% % Reduction in Volume: -2087.5% Epithelialization: None Tunneling: No Undermining: No Wound Description Classification: Full Thickness Without Exposed Support St Wound Margin: Distinct, outline attached Exudate Amount: Medium Exudate Type: Purulent Exudate Color: yellow, brown, green ructures Foul Odor After Cleansing: No Slough/Fibrino  Yes Wound Bed Granulation Amount: None Present (0%) Exposed Structure Necrotic Amount: Large (67-100%) Fascia Exposed: No Necrotic Quality: Adherent Slough Fat Layer (Subcutaneous Tissue) Exposed: Yes Tendon Exposed: No Muscle  Exposed: No Joint Exposed: No Bone Exposed: No Assessment Notes bright green drainage Treatment Notes Wound #23 (Right, Posterior Lower Leg) 1. Cleanse With Wound Cleanser Soap and water 2. Periwound Care Moisturizing lotion 3. Primary Dressing Applied Santyl 4. Secondary Dressing Dry Gauze 6. Support Layer Holiday representative) Signed: 11/28/2020 4:43:25 PM By: [name] Gouty RN, BSN Signed: 11/29/2020 6:12:47 PM By: Levan Hurst RN, BSN Entered By: [name] [name] on 11/28/2020 13:35:49 -------------------------------------------------------------------------------- Wound Assessment Details Patient Name: Date of Service: BLA [name] [name], [name] 11/28/2020 12:30 PM Medical Record Number: [id number] Patient Account Number: [id number] Date of Birth/Sex: Treating RN: 05/19/1951 (69 y.o. [name] [name] Primary Care Maelys Kinnick: [name] [name] Other Clinician: Referring Jaylun Fleener: Treating Aleja Yearwood/Extender: [name] [name] in Treatment: [place] Wound Status Wound Number: 24 Primary Venous Leg Ulcer Etiology: Wound Location: Right, Anterior Lower Leg Wound Open Wounding Event: Gradually Appeared Status: Date Acquired: 10/10/2020 Comorbid Anemia, Hypertension, Peripheral Arterial Disease, Peripheral Weeks Of Treatment: 7 History: Venous Disease, Raynauds, Scleroderma, Rheumatoid Arthritis, Clustered Wound: No Osteoarthritis Wound Measurements Length: (cm) 1.5 Width: (cm) 0.9 Depth: (cm) 0.3 Area: (cm) 1.06 Volume: (cm) 0.318 % Reduction in Area: 12.4% % Reduction in Volume: 12.4% Epithelialization: None Tunneling: No Undermining: No Wound Description Classification: Full Thickness Without Exposed  Support St Wound Margin: Distinct, outline attached Exudate Amount: Small Exudate Type: Purulent Exudate Color: yellow, brown, green ructures Foul Odor After Cleansing: No Slough/Fibrino Yes Wound Bed Granulation Amount: None Present (0%) Exposed Structure Necrotic Amount: Large (67-100%) Fascia Exposed: No Necrotic Quality: Adherent Slough Fat Layer (Subcutaneous Tissue) Exposed: Yes Tendon Exposed: No Muscle Exposed: No Joint Exposed: No Bone Exposed: No Assessment Notes bright green drainage Treatment Notes Wound #24 (Right, Anterior Lower Leg) 1. Cleanse With Wound Cleanser Soap and water 2. Periwound Care Moisturizing lotion 3. Primary Dressing Applied Santyl 4. Secondary Dressing Dry Gauze 6. Support Layer Holiday representative) Signed: 11/28/2020 4:43:25 PM By: [name] Gouty RN, BSN Signed: 11/29/2020 6:12:47 PM By: Levan Hurst RN, BSN Entered By: [name] [name] on 11/28/2020 13:36:41 -------------------------------------------------------------------------------- Wound Assessment Details Patient Name: Date of Service: BLA [name] [name], [name] 11/28/2020 12:30 PM Medical Record Number: [id number] Patient Account Number: [id number] Date of Birth/Sex: Treating RN: 19-Feb-1951 (69 y.o. [name] [name] Primary Care Adelaine Roppolo: [name] [name] Other Clinician: Referring Theodosia Bahena: Treating Santino Kinsella/Extender: [name] [name] in Treatment: [place] Wound Status Wound Number: 25 Primary Venous Leg Ulcer Etiology: Wound Location: Left, Lateral Foot Wound Open Wounding Event: Gradually Appeared Status: Date Acquired: 10/10/2020 Comorbid Anemia, Hypertension, Peripheral Arterial Disease, Peripheral Weeks Of Treatment: 7 History: Venous Disease, Raynauds, Scleroderma, Rheumatoid Arthritis, Clustered Wound: No Osteoarthritis Photos Photo Uploaded By: Mikeal Hawthorne on 11/29/2020 14:47:40 Wound Measurements Length: (cm)  0.4 Width: (cm) 0.5 Depth: (cm) 0.1 Area: (cm) 0.157 Volume: (cm) 0.016 % Reduction in Area: 58.4% % Reduction in Volume: 57.9% Epithelialization: Small (1-33%) Tunneling: No Undermining: No Wound Description Classification: Full Thickness Without Exposed Support Structures Wound Margin: Distinct, outline attached Exudate Amount: Small Exudate Type: Serous Exudate Color: amber Foul Odor After Cleansing: No Slough/Fibrino Yes Wound Bed Granulation Amount: None Present (0%) Exposed Structure Necrotic Amount: Large (67-100%) Fascia Exposed: No Necrotic Quality: Adherent Slough Fat Layer (Subcutaneous Tissue) Exposed: Yes Tendon Exposed: No Muscle Exposed: No Joint Exposed: No Bone Exposed: No Treatment Notes Wound #25 (Left, Lateral Foot) 1. Cleanse With Wound Cleanser Soap and water 2. Periwound Care Moisturizing lotion 3. Primary Dressing Applied Santyl 4. Secondary Dressing Dry Gauze  6. Support Layer Holiday representative) Signed: 11/28/2020 4:43:25 PM By: [name] Gouty RN, BSN Signed: 11/29/2020 6:12:47 PM By: Levan Hurst RN, BSN Entered By: [name] [name] on 11/28/2020 13:37:25 -------------------------------------------------------------------------------- Wound Assessment Details Patient Name: Date of Service: BLA [name] [name], [name] 11/28/2020 12:30 PM Medical Record Number: [id number] Patient Account Number: [id number] Date of Birth/Sex: Treating RN: May 22, 1951 (70 y.o. [name] [name] Primary Care Davonna Ertl: [name] [name] Other Clinician: Referring Nehemiah Mcfarren: Treating [place] Ice/Extender: [name] [name] in Treatment: [place] Wound Status Wound Number: 26 Primary Venous Leg Ulcer Etiology: Wound Location: Right, Lateral Lower Leg Wound Open Wounding Event: Gradually Appeared Status: Date Acquired: 10/27/2020 Comorbid Anemia, Hypertension, Peripheral Arterial Disease, Peripheral Weeks Of Treatment:  4 History: Venous Disease, Raynauds, Scleroderma, Rheumatoid Arthritis, Clustered Wound: No Osteoarthritis Wound Measurements Length: (cm) 1.3 Width: (cm) 0.6 Depth: (cm) 0.1 Area: (cm) 0.613 Volume: (cm) 0.061 % Reduction in Area: -23.8% % Reduction in Volume: 58.8% Epithelialization: None Tunneling: No Undermining: No Wound Description Classification: Full Thickness Without Exposed Support St Wound Margin: Flat and Intact Exudate Amount: Medium Exudate Type: Purulent Exudate Color: yellow, brown, green ructures Foul Odor After Cleansing: No Slough/Fibrino Yes Wound Bed Granulation Amount: None Present (0%) Exposed Structure Necrotic Amount: Large (67-100%) Fascia Exposed: No Necrotic Quality: Adherent Slough Fat Layer (Subcutaneous Tissue) Exposed: Yes Tendon Exposed: No Muscle Exposed: No Joint Exposed: No Bone Exposed: No Assessment Notes bright green drainage Treatment Notes Wound #26 (Right, Lateral Lower Leg) 1. Cleanse With Wound Cleanser Soap and water 2. Periwound Care Moisturizing lotion 3. Primary Dressing Applied Santyl 4. Secondary Dressing Dry Gauze 6. Support Layer Holiday representative) Signed: 11/28/2020 4:43:25 PM By: [name] Gouty RN, BSN Signed: 11/29/2020 6:12:47 PM By: Levan Hurst RN, BSN Entered By: [name] [name] on 11/28/2020 13:38:23 -------------------------------------------------------------------------------- Wound Assessment Details Patient Name: Date of Service: BLA [name] [name], [name] 11/28/2020 12:30 PM Medical Record Number: [id number] Patient Account Number: [id number] Date of Birth/Sex: Treating RN: 12-22-1951 (69 y.o. [name] [name] Primary Care Emmylou [name]: [name] [name] Other Clinician: Referring Caeley Dohrmann: Treating Arion Morgan/Extender: [name] [name] in Treatment: [place] Wound Status Wound Number: 27 Primary Venous Leg Ulcer Etiology: Wound Location: Right, Distal,  Anterior Lower Leg Wound Open Wounding Event: Gradually Appeared Status: Date Acquired: 11/07/2020 Comorbid Anemia, Hypertension, Peripheral Arterial Disease, Peripheral Weeks Of Treatment: 3 History: Venous Disease, Raynauds, Scleroderma, Rheumatoid Arthritis, Clustered Wound: No Osteoarthritis Wound Measurements Length: (cm) 0.7 Width: (cm) 0.6 Depth: (cm) 0.1 Area: (cm) 0.33 Volume: (cm) 0.033 % Reduction in Area: -16.6% % Reduction in Volume: -17.9% Epithelialization: Small (1-33%) Tunneling: No Undermining: No Wound Description Classification: Full Thickness Without Exposed Support Structures Wound Margin: Flat and Intact Exudate Amount: Small Exudate Type: Purulent Exudate Color: yellow, brown, green Foul Odor After Cleansing: No Slough/Fibrino Yes Wound Bed Granulation Amount: None Present (0%) Exposed Structure Necrotic Amount: Large (67-100%) Fascia Exposed: No Necrotic Quality: Adherent Slough Fat Layer (Subcutaneous Tissue) Exposed: Yes Tendon Exposed: No Muscle Exposed: No Joint Exposed: No Bone Exposed: No Assessment Notes bright green drainage Treatment Notes Wound #27 (Right, Distal, Anterior Lower Leg) 1. Cleanse With Wound Cleanser Soap and water 2. Periwound Care Moisturizing lotion 3. Primary Dressing Applied Santyl 4. Secondary Dressing Dry Gauze 6. Support Layer Holiday representative) Signed: 11/28/2020 4:43:25 PM By: [name] Gouty RN, BSN Signed: 11/29/2020 6:12:47 PM By: Levan Hurst RN, BSN Entered By: [name] [name] on 11/28/2020 13:39:22 -------------------------------------------------------------------------------- Wound Assessment Details Patient Name: Date of Service: BLA  [name] [name], [name] Margaretmary Eddy. 11/28/2020 12:30 PM Medical Record Number: [id number] Patient Account Number: [id number] Date of Birth/Sex: Treating RN: 03-Oct-1951 (69 y.o. [name] [name] Primary Care Malone Vanblarcom: [name] [name] Other  Clinician: Referring Abigaile Rossie: Treating Tranquilino Fischler/Extender: [name] [name] in Treatment: [place] Wound Status Wound Number: 5 Primary Venous Leg Ulcer Etiology: Wound Location: Right, Medial Lower Leg Wound Open Wounding Event: Gradually Appeared Status: Date Acquired: 01/13/2013 Comorbid Anemia, Hypertension, Peripheral Arterial Disease, Peripheral Weeks Of Treatment: 409 History: Venous Disease, Raynauds, Scleroderma, Rheumatoid Arthritis, Clustered Wound: Yes Osteoarthritis Photos Photo Uploaded By: Mikeal Hawthorne on 11/29/2020 14:47:40 Wound Measurements Length: (cm) 1.8 Width: (cm) 2.4 Depth: (cm) 0.3 Area: (cm) 3.393 Volume: (cm) 1.018 % Reduction in Area: -140% % Reduction in Volume: -622% Epithelialization: Small (1-33%) Tunneling: No Undermining: No Wound Description Classification: Full Thickness With Exposed Support Structures Wound Margin: Distinct, outline attached Exudate Amount: Medium Exudate Type: Purulent Exudate Color: yellow, brown, green Foul Odor After Cleansing: No Slough/Fibrino Yes Wound Bed Granulation Amount: Medium (34-66%) Exposed Structure Granulation Quality: Pink Fascia Exposed: No Necrotic Amount: Medium (34-66%) Fat Layer (Subcutaneous Tissue) Exposed: Yes Necrotic Quality: Adherent Slough Tendon Exposed: No Muscle Exposed: No Joint Exposed: No Bone Exposed: No Assessment Notes bright green drainage Treatment Notes Wound #5 (Right, Medial Lower Leg) 1. Cleanse With Wound Cleanser Soap and water 2. Periwound Care Moisturizing lotion 3. Primary Dressing Applied Santyl 4. Secondary Dressing Dry Gauze 6. Support Layer Holiday representative) Signed: 11/28/2020 4:43:25 PM By: [name] Gouty RN, BSN Signed: 11/29/2020 6:12:47 PM By: Levan Hurst RN, BSN Entered By: [name] [name] on 11/28/2020  13:41:30 -------------------------------------------------------------------------------- Vitals Details Patient Name: Date of Service: BLA [name] [name], [name] 11/28/2020 12:30 PM Medical Record Number: [id number] Patient Account Number: [id number] Date of Birth/Sex: Treating RN: 1951/10/18 (69 y.o. [name] [name] Primary Care Minoru Chap: [name] [name] Other Clinician: Referring Shiesha Jahn: Treating Keali Mccraw/Extender: [name] [name] in Treatment: 453 Vital Signs Time Taken: 12:53 Temperature (F): 98.1 Height (in): 68 Pulse (bpm): 75 Weight (lbs): 132 Respiratory Rate (breaths/min): 18 Body Mass Index (BMI): 20.1 Blood Pressure (mmHg): 149/59 Reference Range: 80 - 120 mg / dl Electronic Signature(s) Signed: 11/30/2020 9:59:16 AM By: [name] [name] Entered By: [name] [name] on 11/28/2020 12:54:14

## 2020-12-05 ENCOUNTER — Ambulatory Visit (INDEPENDENT_AMBULATORY_CARE_PROVIDER_SITE_OTHER): Payer: Medicare Other | Admitting: Physician Assistant

## 2020-12-05 ENCOUNTER — Encounter (HOSPITAL_BASED_OUTPATIENT_CLINIC_OR_DEPARTMENT_OTHER): Payer: Medicare Other | Attending: Internal Medicine | Admitting: Internal Medicine

## 2020-12-05 ENCOUNTER — Encounter (HOSPITAL_COMMUNITY): Payer: Medicare Other

## 2020-12-05 ENCOUNTER — Encounter: Payer: Medicare Other | Admitting: Surgery

## 2020-12-05 ENCOUNTER — Other Ambulatory Visit: Payer: Self-pay

## 2020-12-05 ENCOUNTER — Ambulatory Visit (INDEPENDENT_AMBULATORY_CARE_PROVIDER_SITE_OTHER)
Admission: RE | Admit: 2020-12-05 | Discharge: 2020-12-05 | Disposition: A | Discharge status: Home / Self Care | Payer: Medicare Other | Source: Ambulatory Visit | Attending: Surgery | Admitting: Surgery

## 2020-12-05 ENCOUNTER — Ambulatory Visit (HOSPITAL_COMMUNITY)
Admission: RE | Admit: 2020-12-05 | Discharge: 2020-12-05 | Disposition: A | Discharge status: Home / Self Care | Payer: Medicare Other | Source: Ambulatory Visit | Attending: Surgery | Admitting: Surgery

## 2020-12-05 VITALS — BP 174/86 | Temp 98.9°F | Resp 20 | Ht 67.5 in | Wt 119.0 lb

## 2020-12-05 DIAGNOSIS — I779 Disorder of arteries and arterioles, unspecified: Secondary | ICD-10-CM

## 2020-12-05 DIAGNOSIS — I87331 Chronic venous hypertension (idiopathic) with ulcer and inflammation of right lower extremity: Secondary | ICD-10-CM | POA: Diagnosis not present

## 2020-12-05 DIAGNOSIS — L97522 Non-pressure chronic ulcer of other part of left foot with fat layer exposed: Secondary | ICD-10-CM | POA: Diagnosis not present

## 2020-12-05 DIAGNOSIS — L97523 Non-pressure chronic ulcer of other part of left foot with necrosis of muscle: Secondary | ICD-10-CM | POA: Insufficient documentation

## 2020-12-05 DIAGNOSIS — L97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: Secondary | ICD-10-CM | POA: Insufficient documentation

## 2020-12-05 DIAGNOSIS — I83022 Varicose veins of left lower extremity with ulcer of calf: Secondary | ICD-10-CM | POA: Insufficient documentation

## 2020-12-05 DIAGNOSIS — I872 Venous insufficiency (chronic) (peripheral): Secondary | ICD-10-CM | POA: Diagnosis not present

## 2020-12-05 DIAGNOSIS — L97811 Non-pressure chronic ulcer of other part of right lower leg limited to breakdown of skin: Secondary | ICD-10-CM | POA: Diagnosis not present

## 2020-12-05 DIAGNOSIS — L97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: Secondary | ICD-10-CM | POA: Insufficient documentation

## 2020-12-05 DIAGNOSIS — L97322 Non-pressure chronic ulcer of left ankle with fat layer exposed: Secondary | ICD-10-CM | POA: Diagnosis not present

## 2020-12-05 DIAGNOSIS — L89616 Pressure-induced deep tissue damage of right heel: Secondary | ICD-10-CM | POA: Diagnosis not present

## 2020-12-05 NOTE — Progress Notes (Signed)
HISTORY AND PHYSICAL     CC:  follow up. Requesting Provider:  [name], MD  HPI: This is a 69 y.o. female who is here today for follow up for PAD.  She was a pt of Dr. [name] and had hx of right laser ablation for treatment of venous stasis disease and also a right femoral endarterectomy with patch angioplasty on 09/14/2016.    Previously, the patient has been treated in [place] with a left common femoral endarterectomy and left superficial femoral artery stent as well as a right superficial femoral artery stent.  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  In October, she was seen for non healing wounds on the right leg that were being treated at the wound center.  She also had scabs on her left leg from biopsy sites.  On 10/04/2020, she underwent angioplasty of the right ATA, right TP trunk, and peroneal artery as well as drug coated balloon angioplasty of the right SFA-popliteal artery by Dr. Trula Slade.  On 10/25/2020, she underwent angioplasty of the left ATA and left TP trunk by Dr. Trula Slade.    On 11/07/2020, the pt was sent over from the wound center for right leg pain and swelling.  She was found to have a right femoral/popliteal DVT and she was started on Xarelto.  Dr. Trula Slade continued her Plavix.  She subsequently underwent IVC placement as she could not take the blood thinner due to gastric issues.  CT was negative for PE.    She has hx of HTN, HLD, non Hodgkin's lymphoma, hypothyroidism, scleroderma.  She was found to have hgb of 5.7 in November and underwent endoscopy and found to have severe esophagitis, suspected candidal esophagitis.    The pt returns today for follow up studies.  She states she has a couple new wounds on the back of the right leg.  Her right leg is painful and swollen.  She has difficulty elevating the right leg.  When she is in bed at night, she has to hang her leg down to help with the pain.  She has two ulcers on the left lateral foot.   These areas are being treated with santyl, gauze and ace wrap.  The pt is on a statin for cholesterol management.    The pt is not on an aspirin.    Other AC:  Plavix The pt is on diuretic for hypertension.  The pt does not have diabetes. Tobacco hx:  never    Past Medical History:  Diagnosis Date  . Anemia   . Arthritis   . DOE (dyspnea on exertion)   . Epistaxis   . History of chicken pox   . Hypertension   . Hypothyroidism   . Leg ulcer ([place])   . Melanoma ([place]) 1999  . 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[place])   . Non Hodgkin's lymphoma ([place])    Treated with chemo 2010, felt to be cured.  Marland Kitchen PAH (pulmonary artery hypertension) ([place])   . Peripheral arterial occlusive disease ([place])   . Pulmonary hypertension ([place])   . Requires supplemental oxygen    2 liters-3liters when moving  . 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 ABDOMINAL AORTOGRAM W/LOWER EXTREMITY Bilateral 10/04/2020   Procedure: ABDOMINAL AORTOGRAM W/LOWER EXTREMITY;  Surgeon: Serafina Mitchell, MD;  Location: [place] [facility];  Service: Cardiovascular;  Laterality: Bilateral;  . AMPUTATION Right 10/24/2017   Procedure: RAY resection right  index;  Surgeon: Daryll Brod, MD;  Location: [place];  Service: Orthopedics;  Laterality: Right;  Axillary block  . AMPUTATION Right 11/21/2018   Procedure: AMPUTATION RAY RIGHT MIDDLE FINGER;  Surgeon: Daryll Brod, MD;  Location: [place];  Service: Orthopedics;  Laterality: Right;  . AMPUTATION Right 10/22/2019   Procedure: AMPUTATION DIGIT RIGHT RING AND SMALL FINGER, DIGITAL SYMPATHECTOMY RIGHT RADIAL ULNAR ARCH;  Surgeon: Daryll Brod, MD;  Location: [place];  Service: Orthopedics;  Laterality: Right;  AXILARY  . apligraph  12-04-2102,   12-18-2012   bilateral calves   [name]  . biopsy on cerebellum    . COLONOSCOPY WITH PROPOFOL Left 10/07/2015   Procedure: COLONOSCOPY WITH PROPOFOL;  Surgeon: [name], MD;  Location: [place];  Service: Endoscopy;  Laterality: Left;  . ENDARTERECTOMY FEMORAL Right 09/14/2016   Procedure: ENDARTERECTOMY FEMORAL RIGHT;  Surgeon: [name], MD;  Location: [facility] OR;  Service: Vascular;  Laterality: Right;  . ENDOVENOUS ABLATION SAPHENOUS VEIN W/ LASER  07-24-2012   right greater saphenous vein by [name], M.D.   . ENDOVENOUS ABLATION SAPHENOUS VEIN W/ LASER  10-16-2012   left greater saphenous vein  by Dr. [name]  . ENDOVENOUS ABLATION SAPHENOUS VEIN W/ LASER Right 09-01-2015   endovenous laser ablation right greater saphenous vein by [name] MD  . ESOPHAGOGASTRODUODENOSCOPY N/A 05/07/2016   Procedure: ESOPHAGOGASTRODUODENOSCOPY (EGD);  Surgeon: [name], MD;  Location: [facility] ENDOSCOPY;  Service: Endoscopy;  Laterality: N/A;  . ESOPHAGOGASTRODUODENOSCOPY N/A 11/23/2020   Procedure: ESOPHAGOGASTRODUODENOSCOPY (EGD);  Surgeon: [name], MD;  Location: [facility];  Service: Endoscopy;  Laterality: N/A;  . ESOPHAGOGASTRODUODENOSCOPY (EGD) WITH PROPOFOL Left 10/07/2015   Procedure: ESOPHAGOGASTRODUODENOSCOPY (EGD) WITH PROPOFOL;  Surgeon: [name], MD;  Location: [place];  Service: Endoscopy;  Laterality: Left;  . GIVENS CAPSULE STUDY N/A 07/31/2016   Procedure: GIVENS CAPSULE STUDY;  Surgeon: [name], MD;  Location: [facility] ENDOSCOPY;  Service: Endoscopy;  Laterality: N/A;  . GIVENS CAPSULE STUDY N/A 11/23/2020   Procedure: GIVENS CAPSULE STUDY;  Surgeon: [name], MD;  Location: [facility] ENDOSCOPY;  Service: Endoscopy;  Laterality: N/A;  . IR IVC FILTER PLMT / S&I Burke Keels GUID/MOD SED  11/22/2020  . LOWER EXTREMITY ANGIOGRAPHY N/A 10/25/2020   Procedure: LOWER EXTREMITY ANGIOGRAPHY - Left;  Surgeon: Serafina Mitchell, MD;  Location: [place] [facility];  Service: Cardiovascular;  Laterality: N/A;  . melonoma removes Right    behind right knee  . OPEN REDUCTION INTERNAL FIXATION (ORIF) DISTAL RADIAL FRACTURE Left 11/24/2019   Procedure: OPEN  REDUCTION INTERNAL FIXATION (ORIF) DISTAL RADIAL FRACTURE;  Surgeon: Altamese Concord, MD;  Location: [place];  Service: Orthopedics;  Laterality: Left;  . PATCH ANGIOPLASTY Right 09/14/2016   Procedure: PATCH ANGIOPLASTY RIGHT FEMORAL ARTERY USING HEMASHIELD PLATINUM FINESSE PATCH;  Surgeon: [name], MD;  Location: [place];  Service: Vascular;  Laterality: Right;  . PERIPHERAL VASCULAR BALLOON ANGIOPLASTY Right 10/04/2020   Procedure: PERIPHERAL VASCULAR BALLOON ANGIOPLASTY;  Surgeon: Serafina Mitchell, MD;  Location: [place] [facility];  Service: Cardiovascular;  Laterality: Right;  SFA/PT/AT/PTtrunk  . PERIPHERAL VASCULAR BALLOON ANGIOPLASTY Left 10/25/2020   Procedure: PERIPHERAL VASCULAR BALLOON ANGIOPLASTY;  Surgeon: Serafina Mitchell, MD;  Location: [place] [facility];  Service: Cardiovascular;  Laterality: Left;  anterior tibial and tibioperoneal trunk  . PERIPHERAL VASCULAR CATHETERIZATION N/A 09/07/2016   Procedure: Abdominal Aortogram w/Lower Extremity;  Surgeon: [name], MD;  Location: [place] [facility]  [facility];  Service: Cardiovascular;  Laterality: N/A;  . RIGHT HEART CATH N/A 04/19/2017   Procedure: Right Heart Cath;  Surgeon: [name], MD;  Location: [place] [facility];  Service: Cardiovascular;  Laterality: N/A;  . RIGHT HEART CATHETERIZATION N/A 11/14/2012   Procedure: RIGHT HEART CATH;  Surgeon: [name], MD;  Location: [facility] C[facility] LAB;  Service: Cardiovascular;  Laterality: N/A;  . rt little toe removal  10/1998  . stent left thigh  3/11  . TENDON TRANSFER Right 10/24/2017   Procedure: transfer extensor indecus propius/extensor digitorum commomus index to middle ring and small;  Surgeon: Daryll Brod, MD;  Location: [place];  Service: Orthopedics;  Laterality: Right;  . TONSILLECTOMY    . TONSILLECTOMY AND ADENOIDECTOMY    . TUNNELED VENOUS CATHETER PLACEMENT  01/2013  . ULNAR HEAD EXCISION Right 10/24/2017   Procedure: darrach right ulna;  Surgeon: Daryll Brod, MD;   Location: [place];  Service: Orthopedics;  Laterality: Right;  . VEIN LIGATION AND STRIPPING  05/16/2012   Procedure: VEIN LIGATION AND STRIPPING;  Surgeon: [name], MD;  Location: [facility] OR;  Service: Vascular;  Laterality: Right;  Irrigation and Debridement right lower leg, ligation of saphenous vein.    Allergies  Allergen Reactions  . Levofloxacin Other (See Comments)    FLU LIKE SYMPTOM   . Sulfa Antibiotics Rash and Other (See Comments)    Face peeled Severe rash with significant peeling of face. No airway involvement per patient   . Doxycycline Hyclate Swelling    SWELLING REACTION UNSPECIFIED  "TABLETS ONLY - CAPSULES ARE TOLERATED FINE"  . Penicillins Rash    Has patient had a PCN reaction causing immediate rash, facial/tongue/throat swelling, SOB or lightheadedness with hypotension: no Has patient had a PCN reaction causing severe rash involving mucus membranes or skin necrosis: no Has patient had a PCN reaction that required hospitalization: no Has patient had a PCN reaction occurring within the last 10 years: {no If all of the above answers are "NO", then may proceed with Cephalosporin use.    Current Outpatient Medications  Medication Sig Dispense Refill  . acetaminophen (TYLENOL) 650 MG CR tablet Take 1,300 mg by mouth every 8 (eight) hours as needed for pain.     . Biotin 5000 MCG TABS Take 5,000 mcg by mouth daily.    . Calcium Carbonate-Vit D-Min (CALCIUM 1200 PO) Take 1,200 mg by mouth daily.    . Cholecalciferol (VITAMIN D) 2000 units CAPS Take 2,000 Units by mouth daily.    . citalopram (CELEXA) 20 MG tablet TAKE 1 TABLET BY MOUTH EVERYDAY AT BEDTIME (Patient taking differently: Take 20 mg by mouth at bedtime. ) 90 tablet 3  . clopidogrel (PLAVIX) 75 MG tablet TAKE 1 TABLET BY MOUTH EVERY DAY (Patient taking differently: Take 75 mg by mouth daily. ) 90 tablet 3  . doxycycline (MONODOX) 100 MG capsule Take 100 mg by mouth 2 (two) times daily. Start date : 11/17/20     . FERREX 150 150 MG capsule TAKE 1 CAPSULE BY MOUTH TWICE A DAY (Patient taking differently: Take 150 mg by mouth 2 (two) times daily. ) 180 capsule 1  . fish oil-omega-3 fatty acids 1000 MG capsule Take 1,000 mg by mouth 2 (two) times daily.     . folic acid (FOLVITE) 1 MG tablet TAKE 1 TABLET BY MOUTH EVERY DAY (Patient taking differently: Take 1 mg by mouth daily. ) 90 tablet 1  . levothyroxine (SYNTHROID) 50 MCG tablet  TAKE 1 TABLET BY MOUTH DAILY BEFORE BREAKFAST (Patient taking differently: Take 50 mcg by mouth daily before breakfast. ) 90 tablet 1  . loperamide (IMODIUM) 2 MG capsule Take 2 mg by mouth daily as needed for diarrhea or loose stools.     Marland Kitchen MAGNESIUM ASPARTATE PO Take 1 tablet by mouth in the morning and at bedtime.    . Multiple Vitamin (MULTIVITAMIN WITH MINERALS) TABS tablet Take 1 tablet by mouth daily.    . OPSUMIT 10 MG tablet Take 1 tablet (40m) by mouth daily. (Patient taking differently: Take 10 mg by mouth daily. ) 30 tablet 11  . OXYGEN Inhale 2-3 L into the lungs continuous.     . pantoprazole (PROTONIX) 40 MG tablet Take 1 tablet (40 mg total) by mouth 2 (two) times daily. 180 tablet 3  . potassium chloride 20 MEQ/15ML (10%) SOLN TAKE 2 TEASPOONFUL (DILUTED IN WATER) ONCE DAILY (Patient taking differently: Take 40 mEq by mouth daily. ) 473 mL 11  . predniSONE (DELTASONE) 5 MG tablet Take 5 mg by mouth daily.    . RESTASIS 0.05 % ophthalmic emulsion Place 2 drops into both eyes 2 (two) times daily.     . rosuvastatin (CRESTOR) 5 MG tablet Take 1 tablet every Monday, Wednesday, and Friday. (Patient taking differently: Take 5 mg by mouth every Monday, Wednesday, and Friday. ) 45 tablet 3  . SANTYL ointment Apply 1 application topically every other day.     . sildenafil (REVATIO) 20 MG tablet TAKE 3 TABLETS (60 MG TOTAL) BY MOUTH 3 (THREE) TIMES DAILY. PLEASE CALL FOR OFFICE VISIT [phone](Patient taking differently: Take 60 mg by mouth 3 (three) times daily. ) 810  tablet 1  . spironolactone (ALDACTONE) 25 MG tablet TAKE 1 TABLET BY MOUTH EVERY DAY (Patient taking differently: Take 25 mg by mouth daily. ) 90 tablet 0  . torsemide (DEMADEX) 20 MG tablet Take 40 mg by mouth See admin instructions. Takes 40 mg in the morning and 20 mg at night    . valACYclovir (VALTREX) 500 MG tablet Take 1 tablet (500 mg total) by mouth 2 (two) times daily. (Patient taking differently: Take 500 mg by mouth daily as needed (lip sore). ) 10 tablet 0  . vitamin B-12 (CYANOCOBALAMIN) 1000 MCG tablet Take 1,000 mcg by mouth daily.     No current facility-administered medications for this visit.    Family History  Problem Relation Age of Onset  . Lupus Father   . Lymphoma Father   . Hyperlipidemia Mother   . Cancer Daughter        sarcoma  . Colon cancer Other     Social History   Socioeconomic History  . Marital status: Married    Spouse name: Not on file  . Number of children: 1  . Years of education: Not on file  . Highest education level: Not on file  Occupational History  . Occupation: homemaker  Tobacco Use  . Smoking status: Never Smoker  . Smokeless tobacco: Never Used  Vaping Use  . Vaping Use: Never used  Substance and Sexual Activity  . Alcohol use: Yes    Alcohol/week: 0.0 standard drinks    Comment: 1-2 drinks per day wine.liquor or beer  . Drug use: No  . Sexual activity: Not Currently  Other Topics Concern  . Not on file  Social History Narrative  . Not on file   Social Determinants of Health   Financial Resource Strain:   .  Difficulty of Paying Living Expenses: Not on file  Food Insecurity:   . Worried About Charity fundraiser in the Last Year: Not on file  . Ran Out of Food in the Last Year: Not on file  Transportation Needs:   . Lack of Transportation (Medical): Not on file  . Lack of Transportation (Non-Medical): Not on file  Physical Activity:   . Days of Exercise per Week: Not on file  . Minutes of Exercise per Session:  Not on file  Stress:   . Feeling of Stress : Not on file  Social Connections:   . Frequency of Communication with Friends and Family: Not on file  . Frequency of Social Gatherings with Friends and Family: Not on file  . Attends Religious Services: Not on file  . Active Member of Clubs or Organizations: Not on file  . Attends Archivist Meetings: Not on file  . Marital Status: Not on file  Intimate Partner Violence:   . Fear of Current or Ex-Partner: Not on file  . Emotionally Abused: Not on file  . Physically Abused: Not on file  . Sexually Abused: Not on file     REVIEW OF SYSTEMS:   _0  denotes positive finding, _1  denotes negative finding Cardiac  Comments:  Chest pain or chest pressure:    Shortness of breath upon exertion:    Short of breath when lying flat:    Irregular heart rhythm:        Vascular    Pain in calf, thigh, or hip brought on by ambulation: x   Pain in feet at night that wakes you up from your sleep:  x   Blood clot in your veins: x   Leg swelling:  x       Pulmonary    Oxygen at home: x   Productive cough:     Wheezing:         Neurologic    Sudden weakness in arms or legs:     Sudden numbness in arms or legs:     Sudden onset of difficulty speaking or slurred speech:    Temporary loss of vision in one eye:     Problems with dizziness:         Gastrointestinal    Blood in stool:     Vomited blood:         Genitourinary    Burning when urinating:     Blood in urine:        Psychiatric    Major depression:         Hematologic    Bleeding problems:    Problems with blood clotting too easily:        Skin    Rashes or ulcers:        Constitutional    Fever or chills:      PHYSICAL EXAMINATION:  Today's Vitals   12/05/20 1128  BP: (!) 174/86  Resp: 20  Temp: 98.9 F (37.2 C)  TempSrc: Temporal  Weight: 119 lb (54 kg)  Height: 5' 7.5" (1.715 m)  PainSc: 3    Body mass index is 18.36 kg/m.   General:  WDWN in  NAD; vital signs documented above Gait: Not observed-in wheelchair HENT: WNL, normocephalic Pulmonary: normal non-labored breathing on oxygen Cardiac: regular HR, without  Murmur; without carotid bruits Skin: without rashes Vascular Exam/Pulses:  Right Left  Radial 2+ (normal) 2+ (normal)  Ulnar 2+ (normal) 2+ (normal)  DP monophasic  monophasic  PT monophasic monophasic   Extremities: swelling right lower leg        Musculoskeletal: no muscle wasting or atrophy  Neurologic: A&O X 3;  No focal weakness or paresthesias are detected Psychiatric:  The pt has Normal affect.   Non-Invasive Vascular Imaging:   ABI's/TBI's on 12/05/2020: Right:  [place]/0.3 - Great toe pressure: 54 Left:  [place]/0.27 - Great toe pressure: 49  BLE Arterial duplex on 12/05/2020: +-----------+--------+-----+---------------+----------+--------------------  -+  RIGHT   PSV cm/sRatioStenosis    Waveform Comments    +-----------+--------+-----+---------------+----------+--------------------  CFA Distal 144              monophasic            +-----------+--------+-----+---------------+----------+--------------------  DFA    111              biphasic             +-----------+--------+-----+---------------+----------+--------------------  SFA Prox  158      30-49% stenosismonophasic            +-----------+--------+-----+---------------+----------+--------------------  SFA Mid  175      30-49% stenosismonophasic            +-----------+--------+-----+---------------+----------+--------------------  SFA Distal 125              monophasic            +-----------+--------+-----+---------------+----------+--------------------  POP Prox  115              monophasic            +-----------+--------+-----+---------------+----------+--------------------   POP Mid  262      50-74% stenosis                 +-----------+--------+-----+---------------+----------+--------------------  POP Distal 140              monophasic            +-----------+--------+-----+---------------+----------+--------------------   PTA Distal                     not  imaged-ulceration  +-----------+--------+-----+---------------+----------+--------------------  PERO Distal                    not  imaged-ulceration  +-----------+--------+-----+---------------+----------+--------------------   +-----------+--------+-----+---------------+----------+--------------+  LEFT    PSV cm/sRatioStenosis    Waveform Comments     +-----------+--------+-----+---------------+----------+--------------+  CFA Distal 199      50-74% stenosismonophasic         +-----------+--------+-----+---------------+----------+--------------+  DFA    88              monophasic         +-----------+--------+-----+---------------+----------+--------------+  SFA Prox  187      30-49% stenosismonophasic         +-----------+--------+-----+---------------+----------+--------------+  SFA Mid  127              monophasic         +-----------+--------+-----+---------------+----------+--------------+  SFA Distal 105              monophasic         +-----------+--------+-----+---------------+----------+--------------+  POP Prox  64              monophasic         +-----------+--------+-----+---------------+----------+--------------+  POP Distal 134              monophasic         +-----------+--------+-----+---------------+----------+--------------+  ATA Distal 57  monophasic          +-----------+--------+-----+---------------+----------+--------------+  PTA Distal                     not visualized  +-----------+--------+-----+---------------+----------+--------------+  PERO Distal                    not visualized  +-----------+--------+-----+---------------+----------+--------------+   Summary:  Right: Patent lower extremity arterial system with areas of increased  velocity as noted above.   Left: Patent lower extremity arterial system with areas of increased  velocity as noted above.  Previous ABI's/TBI's on 08/30/2020: Right:  [place]/0.41 - Great toe pressure: 78 Left:  [place]/bandage - Great toe pressure:  bandage    ASSESSMENT/PLAN:: 69 y.o. female here for follow up for PAD and On 10/04/2020, she underwent angioplasty of the right ATA, right TP trunk, and peroneal artery as well as drug coated balloon angioplasty of the right SFA-popliteal artery by Dr. Trula Slade.  On 10/25/2020, she underwent angioplasty of the left ATA and left TP trunk by Dr. Trula Slade.  She has hx of  left common femoral endarterectomy and left superficial femoral artery stent as well as a right superficial femoral artery stent in [place].    She also has hx of laser ablation right GSV and recent DVT in the right femoral and popliteal vein with insertion of IVC filter.  At the next visit with Dr. Trula Slade, can discuss possibility of removal of IVC filter if appropriate.  -pt with recent arteriogram here for follow up.  She has two new ulcers on the posterior portion on the right lower leg.  She does have a 50-74% stenosis in the mid popliteal artery with velocity of 262. Discussed with Dr. Trula Slade & he reviewed her recent arteriogram as well as today's studies and feel we need to continue with wound care at this time at the wound center with Dr. Dellia Nims.   -she has continued on her Plavix -pt will f/u in 3 months with ABI and BLE arterial duplex and see  Dr. Sallyanne Havers, [facility] [phone]  Clinic MD:   Trula Slade

## 2020-12-06 ENCOUNTER — Other Ambulatory Visit: Payer: Self-pay

## 2020-12-06 DIAGNOSIS — I779 Disorder of arteries and arterioles, unspecified: Secondary | ICD-10-CM

## 2020-12-06 NOTE — Progress Notes (Signed)
ANALIZA, COWGER ([id number]) Visit Report for 12/05/2020 Arrival Information Details Patient Name: Date of Service: [name] 12/05/2020 1:45 PM Medical Record Number: [id number] Patient Account Number: [id number] Date of Birth/Sex: Treating RN: October 14, 1951 (69 y.o. [name], [name] Primary Care Mylei Brackeen: [name] Other Clinician: Referring Beckem Tomberlin: Treating Sheronica Corey/Extender: [name] in Treatment: 454 Visit Information History Since Last Visit Added or deleted any medications: No Patient Arrived: Wheel Chair Any new allergies or adverse reactions: No Arrival Time: 14:20 Had a fall or experienced change in No Accompanied By: husband activities of daily living that may affect Transfer Assistance: None risk of falls: Patient Identification Verified: Yes Signs or symptoms of abuse/neglect since last visito No Secondary Verification Process Completed: Yes Hospitalized since last visit: No Patient Requires Transmission-Based Precautions: No Implantable device outside of the clinic excluding No Patient Has Alerts: Yes cellular tissue based products placed in the center Patient Alerts: R ABI: [place] TBI: 0.41 since last visit: L ABI: [place] (07/2020) Has Dressing in Place as Prescribed: Yes Pain Present Now: No Electronic Signature(s) Signed: 12/05/2020 5:19:12 PM By: Rhae Hammock RN Entered By: Rhae Hammock on 12/05/2020 14:21:15 -------------------------------------------------------------------------------- Encounter Discharge Information Details Patient Name: Date of Service: [name] 12/05/2020 1:45 PM Medical Record Number: [id number] Patient Account Number: [id number] Date of Birth/Sex: Treating RN: Jan 15, 1951 (69 y.o. [name], [name] Primary Care Tache Bobst: [name] Other Clinician: Referring Adain Geurin: Treating Laura Radilla/Extender: [name] in Treatment: [phone] Encounter Discharge  Information Items Post Procedure Vitals Discharge Condition: Stable Temperature (F): 98.4 Ambulatory Status: Wheelchair Pulse (bpm): 101 Discharge Destination: Home Respiratory Rate (breaths/min): 17 Transportation: Private Auto Blood Pressure (mmHg): 175/77 Accompanied By: husband Schedule Follow-up Appointment: Yes Clinical Summary of Care: Patient Declined Electronic Signature(s) Signed: 12/05/2020 3:49:35 PM By: Rhae Hammock RN Entered By: Rhae Hammock on 12/05/2020 15:49:35 -------------------------------------------------------------------------------- Lower Extremity Assessment Details Patient Name: Date of Service: [name] 12/05/2020 1:45 PM Medical Record Number: [id number] Patient Account Number: [id number] Date of Birth/Sex: Treating RN: [birth year] (69 y.o. [name], [name] Primary Care Freddye Cardamone: [name] Other Clinician: Referring Deanda Ruddell: Treating Zakia Sainato/Extender: [name] in Treatment: 454 Edema Assessment Assessed: [name]: Yes] [name]: Yes] Edema: [Left: Yes] [Right: Yes] Calf Left: Right: Point of Measurement: 36 cm From Medial Instep 31.5 cm 38 cm Ankle Left: Right: Point of Measurement: 10 cm From Medial Instep 22 cm 22 cm Knee To Floor Left: Right: From Medial Instep 42 cm 44 cm Vascular Assessment Pulses: Dorsalis Pedis Palpable: [Left:Yes] [Right:Yes] Posterior Tibial Palpable: [Left:Yes] [Right:Yes] Electronic Signature(s) Signed: 12/05/2020 5:19:12 PM By: Rhae Hammock RN Entered By: Rhae Hammock on 12/05/2020 14:25:35 -------------------------------------------------------------------------------- Multi Wound Chart Details Patient Name: Date of Service: [name] 12/05/2020 1:45 PM Medical Record Number: [id number] Patient Account Number: [id number] Date of Birth/Sex: Treating RN: [birth year] (69 y.o. 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] Primary Etiology: [16:Anemia, Hypertension, Peripheral] [19:Anemia, Hypertension, Peripheral] [20:Anemia, Hypertension, Peripheral] Comorbid History: [16:Arterial Disease, Peripheral Venous Arterial Disease, Peripheral  Venous Arterial Disease, Peripheral Venous Disease, Raynauds, Scleroderma, Rheumatoid Arthritis, OsteoarthritisRheumatoid Arthritis, Osteoarthritis Rheumatoid  Arthritis, Osteoarthritis 01/04/2020] [19:Disease, Raynauds, Scleroderma, Disease, Raynauds, Scleroderma, 05/02/2020] [20:05/02/2020] Date Acquired: [16:48] [19:31] [20:31] Weeks of Treatment: [16:Open] [19:Open] [20:Open] Wound Status: [16:No] [19:No] [20:No] Clustered Wound: [16:0.7x1.2x0.5] [19:2x0.7x0.2] [20:1x0.9x0.3] Measurements L x W x D (cm) [16:0.66] [19:1.1] [28:4.132] A (cm) : rea [16:0.33] [19:0.22] [20:0.212] Volume (cm) : [16:94.70%] [19:-680.10%] [20:-60.70%] % Reduction in A [16:rea: 73.70%] [19:-685.70%] [20:-140.90%] % Reduction in Volume: [16:Category/Stage III] [19:Full Thickness Without Exposed] [20:Full Thickness Without Exposed] Classification: [16:Small] [19:Support Structures Small] [20:Support Structures Small] Exudate A mount: [16:Serosanguineous] [19:Serous] [20:Purulent] Exudate Type: [16:red, brown] [19:amber] [20:yellow, brown, green] Exudate Color: [16:Epibole] [19:Distinct, outline attached] [20:Distinct, outline  attached] Wound Margin: [16:Medium (34-66%)] [19:Medium (34-66%)] [20:None Present (0%)] Granulation A mount: [16:Pink, Pale] [19:Pink] [20:N/A] Granulation Quality: [16:Small (1-33%)] [19:Medium (34-66%)] [20:Large (67-100%)] Necrotic A mount: [16:Fat Layer (Subcutaneous Tissue): Yes Fat Layer (Subcutaneous Tissue): Yes Fat Layer (Subcutaneous Tissue): Yes] Exposed Structures: [16:Fascia: No Tendon: No Muscle: No Joint: No Bone: No Medium (34-66%)] [19:Fascia: No Tendon: No Muscle: No Joint: No Bone: No Small (1-33%)] [20:Fascia: No Tendon: No Muscle: No Joint: No Bone: No None] Epithelialization: [16:N/A] [19:N/A] [20:Debridement - Excisional] Debridement: Pre-procedure Verification/Time Out N/A [19:N/A] [20:15:13] Taken: [16:N/A] [19:N/A] [20:Other] Pain Control: [16:N/A] [19:N/A] [20:Subcutaneous, Slough] Tissue Debrided: [16:N/A] [19:N/A] [20:Skin/Subcutaneous Tissue] Level: [16:N/A] [19:N/A] [20:0.9] Debridement A (sq cm): [16:rea N/A] [19:N/A] [20:Curette] Instrument: [16:N/A] [19:N/A] [20:Minimum] Bleeding: [16:N/A] [19:N/A] [20:Pressure] Hemostasis A chieved: [16:N/A] [19:N/A] [20:0] Procedural Pain: [16:N/A] [19:N/A] [20:0] Post Procedural Pain: [16:N/A] [19:N/A] [20:Procedure was tolerated well] Debridement Treatment Response: [16:N/A] [19:N/A] [20:1x0.9x0.3] Post Debridement Measurements L x W x D (cm) [16:N/A] [19:N/A] [20:0.212] Post Debridement Volume: (cm) [16:N/A] [19:N/A] [20:Debridement] Wound Number: _0 Photos: No Photos No Photos No Photos Right, Posterior Lower Leg Right, Anterior Lower Leg Left, Lateral Foot Wound Location: Gradually Appeared Gradually Appeared Gradually Appeared 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 Primary Etiology: Anemia, Hypertension, Peripheral Anemia, Hypertension, Peripheral Anemia, Hypertension, Peripheral Comorbid History: Arterial Disease, Peripheral Venous Arterial Disease, Peripheral Venous  Arterial Disease, Peripheral Venous Disease, Raynauds, Scleroderma, Disease, Raynauds, Scleroderma, Disease, Raynauds, Scleroderma, Rheumatoid Arthritis, Osteoarthritis Rheumatoid Arthritis, Osteoarthritis Rheumatoid Arthritis, Osteoarthritis 08/15/2020 10/10/2020 10/10/2020 Date Acquired: _1 Weeks of Treatment: Open Open Open Wound Status: No No No Clustered Wound: 1.4x1.4x0.3 1.5x1.2x0.4 1.1x0.7x0.1 Measurements L x W x D (cm) 1.539 1.414 0.605 A (cm) : rea 0.462 0.565 0.06 Volume (cm) : -37.00% -16.90% -60.50% % Reduction in A rea: -312.50% -55.60% -57.90% % Reduction in Volume: Full Thickness Without Exposed Full Thickness Without Exposed Full Thickness Without Exposed Classification: Support Structures Support Structures Support Structures Medium Small Small Exudate A mount: Purulent Purulent Serous Exudate Type: yellow, brown, green yellow, brown, green amber Exudate Color: Distinct, outline attached Distinct, outline attached Distinct, outline attached Wound Margin: None Present (0%) None Present (0%) None Present (0%) Granulation A mount: N/A N/A N/A Granulation Quality: Large (67-100%) Large (67-100%) Large (67-100%) Necrotic A mount: Fat Layer (Subcutaneous Tissue): Yes Fat Layer (Subcutaneous Tissue): Yes Fat Layer (Subcutaneous Tissue): Yes Exposed Structures: Fascia: No Fascia: No Fascia: No Tendon: No Tendon: No Tendon: No Muscle: No Muscle: No Muscle: No Joint: No Joint: No Joint: No Bone: No Bone: No Bone: No None None Small (1-33%) Epithelialization: N/A N/A Debridement - Excisional Debridement: Pre-procedure Verification/Time Out N/A N/A 15:13 Taken: N/A N/A Other Pain Control: N/A N/A Subcutaneous, Slough Tissue Debrided: N/A N/A Skin/Subcutaneous Tissue Level: N/A  N/A 0.77 Debridement A (sq cm): rea N/A N/A Curette Instrument: N/A N/A Minimum Bleeding: N/A N/A Pressure Hemostasis A chieved: N/A N/A 0 Procedural  Pain: N/A N/A 0 Post Procedural Pain: N/A N/A Procedure was tolerated well Debridement Treatment Response: N/A N/A 1.1x0.7x0.1 Post Debridement Measurements L x W x D (cm) N/A N/A 0.06 Post Debridement Volume: (cm) N/A N/A Debridement Procedures Performed: Wound Number: _0 Photos: No Photos No Photos No Photos Right, Lateral Lower Leg Right, Distal, Anterior Lower Leg Right, Medial Lower Leg Wound Location: Gradually Appeared Gradually Appeared Gradually Appeared 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 Primary Etiology: Anemia, Hypertension, Peripheral Anemia, Hypertension, Peripheral Anemia, Hypertension, Peripheral Comorbid History: Arterial Disease, Peripheral Venous Arterial Disease, Peripheral Venous Arterial Disease, Peripheral Venous Disease, Raynauds, Scleroderma, Disease, Raynauds, Scleroderma, Disease, Raynauds, Scleroderma, Rheumatoid Arthritis, Osteoarthritis Rheumatoid Arthritis, Osteoarthritis Rheumatoid Arthritis, Osteoarthritis 10/27/2020 11/07/2020 01/13/2013 Date A cquired: 5 4 410 Weeks of Treatment: Open Open Open Wound Status: No No Yes Clustered Wound: 2.5x2.5x0.9 1x1x0.2 2.2x2.5x0.4 Measurements L x W x D (cm) 4.909 0.785 4.32 A (cm) : rea 4.418 0.157 1.728 Volume (cm) : -891.70% -177.40% -205.50% % Reduction in A rea: -2885.10% -460.70% -1125.50% % Reduction in Volume: Full Thickness Without Exposed Full Thickness Without Exposed Full Thickness With Exposed Support Classification: Support Structures Support Structures Structures Medium Small Medium Exudate A mount: Purulent Purulent Purulent Exudate Type: yellow, brown, green yellow, brown, green yellow, brown, green Exudate Color: Flat and Intact Flat and Intact Distinct, outline attached Wound Margin: None Present (0%) None Present (0%) Medium (34-66%) Granulation A mount: N/A N/A Pink Granulation Quality: Large (67-100%) Large (67-100%) Medium  (34-66%) Necrotic A mount: Fat Layer (Subcutaneous Tissue): Yes Fat Layer (Subcutaneous Tissue): Yes Fat Layer (Subcutaneous Tissue): Yes Exposed Structures: Fascia: No Fascia: No Fascia: No Tendon: No Tendon: No Tendon: No Muscle: No Muscle: No Muscle: No Joint: No Joint: No Joint: No Bone: No Bone: No Bone: No None Small (1-33%) Small (1-33%) Epithelialization: N/A N/A N/A Debridement: N/A N/A N/A Pain Control: N/A N/A N/A Tissue Debrided: N/A N/A N/A Level: N/A N/A N/A Debridement A (sq cm): rea N/A N/A N/A Instrument: N/A N/A N/A Bleeding: N/A N/A N/A Hemostasis A chieved: N/A N/A N/A Procedural Pain: N/A N/A N/A Post Procedural Pain: Debridement Treatment Response: N/A N/A N/A Post Debridement Measurements L x N/A N/A N/A W x D (cm) N/A N/A N/A Post Debridement Volume: (cm) N/A N/A N/A Procedures Performed: Treatment Notes Electronic Signature(s) Signed: 12/05/2020 5:36:08 PM By: Levan Hurst RN, BSN Signed: 12/06/2020 9:01:39 AM By: Linton Ham MD Entered By: Linton Ham on 12/05/2020 15:25:51 -------------------------------------------------------------------------------- Multi-Disciplinary Care Plan Details Patient Name: Date of Service: [name], [name] 12/05/2020 1:45 PM Medical Record Number: [id number] Patient Account Number: [id number] Date of Birth/Sex: Treating RN: 08-Feb-1951 (69 y.o. [name] [name] Primary Care Shaliah Wann: [name] Other Clinician: Referring Avett Reineck: Treating Michalle Rademaker/Extender: [name] in Treatment: [phone] Active Inactive Wound/Skin Impairment Nursing Diagnoses: Impaired tissue integrity Knowledge deficit related to ulceration/compromised skin integrity Goals: Patient/caregiver will verbalize understanding of skin care regimen Date Initiated: 10/11/2016 Target Resolution Date: 01/06/2021 Goal Status: Active Interventions: Assess patient/caregiver ability to obtain  necessary supplies Assess patient/caregiver ability to perform ulcer/skin care regimen upon admission and as needed Assess ulceration(s) every visit Provide education on ulcer and skin care Treatment Activities: Skin care regimen initiated : 10/11/2016 Topical wound management initiated : 10/11/2016 Notes: Electronic Signature(s) Signed: 12/05/2020 5:36:08 PM  By: Levan Hurst RN, BSN Entered By: Levan Hurst on 12/05/2020 17:28:05 -------------------------------------------------------------------------------- Pain Assessment Details Patient Name: Date of Service: [name], JO Margaretmary Eddy 12/05/2020 1:45 PM Medical Record Number: [id number] Patient Account Number: [id number] Date of Birth/Sex: Treating RN: [birth year] (69 y.o. [name], [name] Primary Care Shellye Zandi: [name] Other Clinician: Referring Leoda Smithhart: Treating Kailon Treese/Extender: [name] in Treatment: [phone] Active Problems Location of Pain Severity and Description of Pain Patient Has Paino No Site Locations Rate the pain. Rate the pain. Current Pain Level: 0 Pain Management and Medication Current Pain Management: Electronic Signature(s) Signed: 12/05/2020 5:19:12 PM By: Rhae Hammock RN Entered By: Rhae Hammock on 12/05/2020 14:21:26 -------------------------------------------------------------------------------- Patient/Caregiver Education Details Patient Name: Date of Service: [name] 12/6/2021andnbsp1:45 PM Medical Record Number: [id number] Patient Account Number: [id number] Date of Birth/Gender: Treating RN: 04-10-1951 (69 y.o. [name] [name] Primary Care Physician: [name] Other Clinician: Referring Physician: Treating Physician/Extender: [name] in Treatment: 454 Education Assessment Education Provided To: Patient Education Topics Provided Wound/Skin Impairment: Methods: Explain/Verbal Responses: State content  correctly [employer]) Signed: 12/05/2020 5:36:08 PM By: Levan Hurst RN, BSN Entered By: Levan Hurst on 12/05/2020 17:28:21 -------------------------------------------------------------------------------- Wound Assessment Details Patient Name: Date of Service: [name] 12/05/2020 1:45 PM Medical Record Number: [id number] Patient Account Number: [id number] Date of Birth/Sex: Treating RN: [birth year] (69 y.o. [name], [name] Primary Care Harley Mccartney: [name] Other Clinician: Referring Timaya Bojarski: Treating Nastasia Kage/Extender: [name] in Treatment: [place] Wound Status Wound Number: 16 Primary Pressure Ulcer Etiology: Wound Location: Right Calcaneus Wound Open Wounding Event: Pressure Injury Status: Date Acquired: 01/04/2020 Comorbid Anemia, Hypertension, Peripheral Arterial Disease, Peripheral Weeks Of Treatment: 48 History: Venous Disease, Raynauds, Scleroderma, Rheumatoid Arthritis, Clustered Wound: No Osteoarthritis Wound Measurements Length: (cm) 0.7 Width: (cm) 1.2 Depth: (cm) 0.5 Area: (cm) 0.66 Volume: (cm) 0.33 % Reduction in Area: 94.7% % Reduction in Volume: 73.7% Epithelialization: Medium (34-66%) Tunneling: No Undermining: No Wound Description Classification: Category/Stage III Wound Margin: Epibole Exudate Amount: Small Exudate Type: Serosanguineous Exudate Color: red, brown Foul Odor After Cleansing: No Slough/Fibrino Yes Wound Bed Granulation Amount: Medium (34-66%) Exposed Structure Granulation Quality: Pink, Pale Fascia Exposed: No Necrotic Amount: Small (1-33%) Fat Layer (Subcutaneous Tissue) Exposed: Yes Necrotic Quality: Adherent Slough Tendon Exposed: No Muscle Exposed: No Joint Exposed: No Bone Exposed: No Treatment Notes Wound #16 (Calcaneus) Wound Laterality: Right Cleanser Soap and Water Discharge Instruction: May shower and wash wound with dial antibacterial soap and water prior  to dressing change. Peri-Wound Care Sween Lotion (Moisturizing lotion) Discharge Instruction: Apply moisturizing lotion as directed Topical Primary Dressing Santyl Ointment Discharge Instruction: Apply to wound bed as instructed Secondary Dressing Woven Gauze Sponge, Non-Sterile 4x4 in Discharge Instruction: Apply over primary dressing as directed. ABD Pad, 8x10 Discharge Instruction: Apply over primary dressing as directed. Secured With Compression Wrap Kerlix Roll 4.5x3.1 (in/yd) Discharge Instruction: Apply Kerlix and Coban compression as directed. Coban Self-Adherent Wrap 4x5 (in/yd) Discharge Instruction: Apply over Kerlix as directed. Compression Stockings Add-Ons Electronic Signature(s) Signed: 12/05/2020 2:53:42 PM By: Rhae Hammock RN Entered By: Rhae Hammock on 12/05/2020 14:53:41 -------------------------------------------------------------------------------- Wound Assessment Details Patient Name: Date of Service: [name] 12/05/2020 1:45 PM Medical Record Number: [id number] Patient Account Number: [id number] Date of Birth/Sex: Treating RN: [birth year] (69 y.o. [name], [name] Primary Care Ariany Kesselman: [name] Other Clinician: Referring Clemens Lachman: Treating Phenix Vandermeulen/Extender: [name] in  Treatment: 454 Wound Status Wound Number: 19 Primary Venous Leg Ulcer Etiology: Wound Location: Right, Lateral Malleolus Wound Open Wounding Event: Gradually Appeared Status: Date Acquired: 05/02/2020 Comorbid Anemia, Hypertension, Peripheral Arterial Disease, Peripheral Weeks Of Treatment: 31 History: Venous Disease, Raynauds, Scleroderma, Rheumatoid Arthritis, Clustered Wound: No Osteoarthritis Wound Measurements Length: (cm) 2 Width: (cm) 0.7 Depth: (cm) 0.2 Area: (cm) 1.1 Volume: (cm) 0.22 % Reduction in Area: -680.1% % Reduction in Volume: -685.7% Epithelialization: Small (1-33%) Tunneling: No Undermining:  No Wound Description Classification: Full Thickness Without Exposed Support Structures Wound Margin: Distinct, outline attached Exudate Amount: Small Exudate Type: Serous Exudate Color: amber Foul Odor After Cleansing: No Slough/Fibrino Yes Wound Bed Granulation Amount: Medium (34-66%) Exposed Structure Granulation Quality: Pink Fascia Exposed: No Necrotic Amount: Medium (34-66%) Fat Layer (Subcutaneous Tissue) Exposed: Yes Necrotic Quality: Adherent Slough Tendon Exposed: No Muscle Exposed: No Joint Exposed: No Bone Exposed: No Treatment Notes Wound #19 (Malleolus) Wound Laterality: Right, Lateral Cleanser Soap and Water Discharge Instruction: May shower and wash wound with dial antibacterial soap and water prior to dressing change. Peri-Wound Care Sween Lotion (Moisturizing lotion) Discharge Instruction: Apply moisturizing lotion as directed Topical Primary Dressing Santyl Ointment Discharge Instruction: Apply to wound bed as instructed Secondary Dressing Woven Gauze Sponge, Non-Sterile 4x4 in Discharge Instruction: Apply over primary dressing as directed. ABD Pad, 8x10 Discharge Instruction: Apply over primary dressing as directed. Secured With Compression Wrap Kerlix Roll 4.5x3.1 (in/yd) Discharge Instruction: Apply Kerlix and Coban compression as directed. Coban Self-Adherent Wrap 4x5 (in/yd) Discharge Instruction: Apply over Kerlix as directed. Compression Stockings Add-Ons Electronic Signature(s) Signed: 12/05/2020 2:50:30 PM By: Rhae Hammock RN Entered By: Rhae Hammock on 12/05/2020 14:50:30 -------------------------------------------------------------------------------- Wound Assessment Details Patient Name: Date of Service: [name] 12/05/2020 1:45 PM Medical Record Number: [id number] Patient Account Number: [id number] Date of Birth/Sex: Treating RN: 06/18/1951 (69 y.o. [name], [name] Primary Care Nikola Marone: [name] Other  Clinician: Referring Capitola Ladson: Treating Arriah Wadle/Extender: [name] in Treatment: [place] Wound Status Wound Number: 20 Primary Arterial Insufficiency Ulcer Etiology: Wound Location: Left, Lateral Malleolus Wound Open Wounding Event: Gradually Appeared Status: Date Acquired: 05/02/2020 Comorbid Anemia, Hypertension, Peripheral Arterial Disease, Peripheral Weeks Of Treatment: 31 History: Venous Disease, Raynauds, Scleroderma, Rheumatoid Arthritis, Clustered Wound: No Osteoarthritis Wound Measurements Length: (cm) 1 Width: (cm) 0.9 Depth: (cm) 0.3 Area: (cm) 0.707 Volume: (cm) 0.212 % Reduction in Area: -60.7% % Reduction in Volume: -140.9% Epithelialization: None Tunneling: No Undermining: No Wound Description Classification: Full Thickness Without Exposed Support Structures Wound Margin: Distinct, outline attached Exudate Amount: Small Exudate Type: Purulent Exudate Color: yellow, brown, green Foul Odor After Cleansing: No Slough/Fibrino Yes Wound Bed Granulation Amount: None Present (0%) Exposed Structure Necrotic Amount: Large (67-100%) Fascia Exposed: No Necrotic Quality: Adherent Slough Fat Layer (Subcutaneous Tissue) Exposed: Yes Tendon Exposed: No Muscle Exposed: No Joint Exposed: No Bone Exposed: No Treatment Notes Wound #20 (Malleolus) Wound Laterality: Left, Lateral Cleanser Soap and Water Discharge Instruction: May shower and wash wound with dial antibacterial soap and water prior to dressing change. Peri-Wound Care Sween Lotion (Moisturizing lotion) Discharge Instruction: Apply moisturizing lotion as directed Topical Primary Dressing Santyl Ointment Discharge Instruction: Apply to wound bed as instructed Secondary Dressing Woven Gauze Sponge, Non-Sterile 4x4 in Discharge Instruction: Apply over primary dressing as directed. ABD Pad, 8x10 Discharge Instruction: Apply over primary dressing as directed. Secured  With Compression Wrap Kerlix Roll 4.5x3.1 (in/yd) Discharge Instruction: Apply Kerlix and Coban compression as directed. Coban Self-Adherent Wrap 4x5 (in/yd)  Discharge Instruction: Apply over Kerlix as directed. Compression Stockings Add-Ons Electronic Signature(s) Signed: 12/05/2020 2:49:59 PM By: Rhae Hammock RN Entered By: Rhae Hammock on 12/05/2020 14:49:58 -------------------------------------------------------------------------------- Wound Assessment Details Patient Name: Date of Service: [name] 12/05/2020 1:45 PM Medical Record Number: [id number] Patient Account Number: [id number] Date of Birth/Sex: Treating RN: August 17, 1951 (69 y.o. [name], [name] Primary Care Harland Aguiniga: [name] Other Clinician: Referring Countess Biebel: Treating Tyriq Moragne/Extender: [name] in Treatment: [place] Wound Status Wound Number: 23 Primary Venous Leg Ulcer Etiology: Wound Location: Right, Posterior Lower Leg Wound Open Wounding Event: Gradually Appeared Status: Date Acquired: 08/15/2020 Comorbid Anemia, Hypertension, Peripheral Arterial Disease, Peripheral Weeks Of Treatment: 16 History: Venous Disease, Raynauds, Scleroderma, Rheumatoid Arthritis, Clustered Wound: No Osteoarthritis Wound Measurements Length: (cm) 1.4 Width: (cm) 1.4 Depth: (cm) 0.3 Area: (cm) 1.539 Volume: (cm) 0.462 % Reduction in Area: -37% % Reduction in Volume: -312.5% Epithelialization: None Tunneling: No Undermining: No Wound Description Classification: Full Thickness Without Exposed Support Structures Wound Margin: Distinct, outline attached Exudate Amount: Medium Exudate Type: Purulent Exudate Color: yellow, brown, green Foul Odor After Cleansing: No Slough/Fibrino Yes Wound Bed Granulation Amount: None Present (0%) Exposed Structure Necrotic Amount: Large (67-100%) Fascia Exposed: No Necrotic Quality: Adherent Slough Fat Layer (Subcutaneous Tissue)  Exposed: Yes Tendon Exposed: No Muscle Exposed: No Joint Exposed: No Bone Exposed: No Treatment Notes Wound #23 (Lower Leg) Wound Laterality: Right, Posterior Cleanser Soap and Water Discharge Instruction: May shower and wash wound with dial antibacterial soap and water prior to dressing change. Peri-Wound Care Sween Lotion (Moisturizing lotion) Discharge Instruction: Apply moisturizing lotion as directed Topical Primary Dressing Santyl Ointment Discharge Instruction: Apply to wound bed as instructed Secondary Dressing Woven Gauze Sponge, Non-Sterile 4x4 in Discharge Instruction: Apply over primary dressing as directed. ABD Pad, 8x10 Discharge Instruction: Apply over primary dressing as directed. Secured With Compression Wrap Kerlix Roll 4.5x3.1 (in/yd) Discharge Instruction: Apply Kerlix and Coban compression as directed. Coban Self-Adherent Wrap 4x5 (in/yd) Discharge Instruction: Apply over Kerlix as directed. Compression Stockings Add-Ons Electronic Signature(s) Signed: 12/05/2020 2:51:02 PM By: Rhae Hammock RN Entered By: Rhae Hammock on 12/05/2020 14:51:01 -------------------------------------------------------------------------------- Wound Assessment Details Patient Name: Date of Service: [name] 12/05/2020 1:45 PM Medical Record Number: [id number] Patient Account Number: [id number] Date of Birth/Sex: Treating RN: 14-Feb-1951 (69 y.o. [name], [name] Primary Care Charma Mocarski: [name] Other Clinician: Referring Jaiden Dinkins: Treating Kayde Warehime/Extender: [name] in Treatment: [place] Wound Status Wound Number: 24 Primary Venous Leg Ulcer Etiology: Wound Location: Right, Anterior Lower Leg Wound Open Wounding Event: Gradually Appeared Status: Date Acquired: 10/10/2020 Comorbid Anemia, Hypertension, Peripheral Arterial Disease, Peripheral Weeks Of Treatment: 8 History: Venous Disease, Raynauds, Scleroderma,  Rheumatoid Arthritis, Clustered Wound: No Osteoarthritis Wound Measurements Length: (cm) 1.5 Width: (cm) 1.2 Depth: (cm) 0.4 Area: (cm) 1.414 Volume: (cm) 0.565 % Reduction in Area: -16.9% % Reduction in Volume: -55.6% Epithelialization: None Tunneling: No Undermining: No Wound Description Classification: Full Thickness Without Exposed Support Structu Wound Margin: Distinct, outline attached Exudate Amount: Small Exudate Type: Purulent Exudate Color: yellow, brown, green res Foul Odor After Cleansing: No Slough/Fibrino Yes Wound Bed Granulation Amount: None Present (0%) Exposed Structure Necrotic Amount: Large (67-100%) Fascia Exposed: No Necrotic Quality: Adherent Slough Fat Layer (Subcutaneous Tissue) Exposed: Yes Tendon Exposed: No Muscle Exposed: No Joint Exposed: No Bone Exposed: No Treatment Notes Wound #24 (Lower Leg) Wound Laterality: Right, Anterior Cleanser Soap and Water Discharge Instruction: May shower and wash wound  with dial antibacterial soap and water prior to dressing change. Peri-Wound Care Sween Lotion (Moisturizing lotion) Discharge Instruction: Apply moisturizing lotion as directed Topical Primary Dressing Santyl Ointment Discharge Instruction: Apply to wound bed as instructed Secondary Dressing Woven Gauze Sponge, Non-Sterile 4x4 in Discharge Instruction: Apply over primary dressing as directed. ABD Pad, 8x10 Discharge Instruction: Apply over primary dressing as directed. Secured With Compression Wrap Kerlix Roll 4.5x3.1 (in/yd) Discharge Instruction: Apply Kerlix and Coban compression as directed. Coban Self-Adherent Wrap 4x5 (in/yd) Discharge Instruction: Apply over Kerlix as directed. Compression Stockings Add-Ons Electronic Signature(s) Signed: 12/05/2020 2:54:43 PM By: Rhae Hammock RN Entered By: Rhae Hammock on 12/05/2020 14:54:42 -------------------------------------------------------------------------------- Wound  Assessment Details Patient Name: Date of Service: [name] 12/05/2020 1:45 PM Medical Record Number: [id number] Patient Account Number: [id number] Date of Birth/Sex: Treating RN: 11/12/1951 (69 y.o. [name], [name] Primary Care Ame Heagle: [name] Other Clinician: Referring Chrys Landgrebe: Treating Marely Apgar/Extender: [name] in Treatment: [place] Wound Status Wound Number: 25 Primary Venous Leg Ulcer Etiology: Wound Location: Left, Lateral Foot Wound Open Wounding Event: Gradually Appeared Status: Date Acquired: 10/10/2020 Comorbid Anemia, Hypertension, Peripheral Arterial Disease, Peripheral Weeks Of Treatment: 8 History: Venous Disease, Raynauds, Scleroderma, Rheumatoid Arthritis, Clustered Wound: No Osteoarthritis Wound Measurements Length: (cm) 1.1 Width: (cm) 0.7 Depth: (cm) 0.1 Area: (cm) 0.605 Volume: (cm) 0.06 % Reduction in Area: -60.5% % Reduction in Volume: -57.9% Epithelialization: Small (1-33%) Tunneling: No Undermining: No Wound Description Classification: Full Thickness Without Exposed Support Structures Wound Margin: Distinct, outline attached Exudate Amount: Small Exudate Type: Serous Exudate Color: amber Foul Odor After Cleansing: No Slough/Fibrino Yes Wound Bed Granulation Amount: None Present (0%) Exposed Structure Necrotic Amount: Large (67-100%) Fascia Exposed: No Necrotic Quality: Adherent Slough Fat Layer (Subcutaneous Tissue) Exposed: Yes Tendon Exposed: No Muscle Exposed: No Joint Exposed: No Bone Exposed: No Treatment Notes Wound #25 (Foot) Wound Laterality: Left, Lateral Cleanser Soap and Water Discharge Instruction: May shower and wash wound with dial antibacterial soap and water prior to dressing change. Peri-Wound Care Sween Lotion (Moisturizing lotion) Discharge Instruction: Apply moisturizing lotion as directed Topical Primary Dressing Santyl Ointment Discharge Instruction: Apply to  wound bed as instructed Secondary Dressing Woven Gauze Sponge, Non-Sterile 4x4 in Discharge Instruction: Apply over primary dressing as directed. ABD Pad, 8x10 Discharge Instruction: Apply over primary dressing as directed. Secured With Compression Wrap Kerlix Roll 4.5x3.1 (in/yd) Discharge Instruction: Apply Kerlix and Coban compression as directed. Coban Self-Adherent Wrap 4x5 (in/yd) Discharge Instruction: Apply over Kerlix as directed. Compression Stockings Add-Ons Electronic Signature(s) Signed: 12/05/2020 2:52:58 PM By: Rhae Hammock RN Entered By: Rhae Hammock on 12/05/2020 14:52:41 -------------------------------------------------------------------------------- Wound Assessment Details Patient Name: Date of Service: [name] 12/05/2020 1:45 PM Medical Record Number: [id number] Patient Account Number: [id number] Date of Birth/Sex: Treating RN: 02-Dec-1951 (69 y.o. [name], [name] Primary Care Jemimah Cressy: [name] Other Clinician: Referring Vinod Mikesell: Treating Arav Bannister/Extender: [name] in Treatment: [place] Wound Status Wound Number: 26 Primary Venous Leg Ulcer Etiology: Wound Location: Right, Lateral Lower Leg Wound Open Wounding Event: Gradually Appeared Status: Date Acquired: 10/27/2020 Comorbid Anemia, Hypertension, Peripheral Arterial Disease, Peripheral Weeks Of Treatment: 5 History: Venous Disease, Raynauds, Scleroderma, Rheumatoid Arthritis, Clustered Wound: No Osteoarthritis Wound Measurements Length: (cm) 2.5 Width: (cm) 2.5 Depth: (cm) 0.9 Area: (cm) 4.909 Volume: (cm) 4.418 % Reduction in Area: -891.7% % Reduction in Volume: -2885.1% Epithelialization: None Tunneling: No Undermining: No Wound Description Classification: Full Thickness Without Exposed Support Structures Wound  Margin: Flat and Intact Exudate Amount: Medium Exudate Type: Purulent Exudate Color: yellow, brown, green Foul Odor  After Cleansing: No Slough/Fibrino Yes Wound Bed Granulation Amount: None Present (0%) Exposed Structure Necrotic Amount: Large (67-100%) Fascia Exposed: No Necrotic Quality: Adherent Slough Fat Layer (Subcutaneous Tissue) Exposed: Yes Tendon Exposed: No Muscle Exposed: No Joint Exposed: No Bone Exposed: No Treatment Notes Wound #26 (Lower Leg) Wound Laterality: Right, Lateral Cleanser Soap and Water Discharge Instruction: May shower and wash wound with dial antibacterial soap and water prior to dressing change. Peri-Wound Care Sween Lotion (Moisturizing lotion) Discharge Instruction: Apply moisturizing lotion as directed Topical Primary Dressing Santyl Ointment Discharge Instruction: Apply to wound bed as instructed Secondary Dressing Woven Gauze Sponge, Non-Sterile 4x4 in Discharge Instruction: Apply over primary dressing as directed. ABD Pad, 8x10 Discharge Instruction: Apply over primary dressing as directed. Secured With Compression Wrap Kerlix Roll 4.5x3.1 (in/yd) Discharge Instruction: Apply Kerlix and Coban compression as directed. Coban Self-Adherent Wrap 4x5 (in/yd) Discharge Instruction: Apply over Kerlix as directed. Compression Stockings Add-Ons Electronic Signature(s) Signed: 12/05/2020 2:55:21 PM By: Rhae Hammock RN Entered By: Rhae Hammock on 12/05/2020 14:55:20 -------------------------------------------------------------------------------- Wound Assessment Details Patient Name: Date of Service: [name] 12/05/2020 1:45 PM Medical Record Number: [id number] Patient Account Number: [id number] Date of Birth/Sex: Treating RN: October 02, 1951 (69 y.o. [name], [name] Primary Care Zhaire Locker: [name] Other Clinician: Referring Esmerelda Finnigan: Treating Stephane Junkins/Extender: [name] in Treatment: [place] Wound Status Wound Number: 27 Primary Venous Leg Ulcer Etiology: Wound Location: Right, Distal, Anterior Lower  Leg Wound Open Wounding Event: Gradually Appeared Status: Date Acquired: 11/07/2020 Comorbid Anemia, Hypertension, Peripheral Arterial Disease, Peripheral Weeks Of Treatment: 4 History: Venous Disease, Raynauds, Scleroderma, Rheumatoid Arthritis, Clustered Wound: No Osteoarthritis Wound Measurements Length: (cm) 1 Width: (cm) 1 Depth: (cm) 0.2 Area: (cm) 0.785 Volume: (cm) 0.157 % Reduction in Area: -177.4% % Reduction in Volume: -460.7% Epithelialization: Small (1-33%) Tunneling: No Undermining: No Wound Description Classification: Full Thickness Without Exposed Support Structures Wound Margin: Flat and Intact Exudate Amount: Small Exudate Type: Purulent Exudate Color: yellow, brown, green Foul Odor After Cleansing: No Slough/Fibrino Yes Wound Bed Granulation Amount: None Present (0%) Exposed Structure Necrotic Amount: Large (67-100%) Fascia Exposed: No Necrotic Quality: Adherent Slough Fat Layer (Subcutaneous Tissue) Exposed: Yes Tendon Exposed: No Muscle Exposed: No Joint Exposed: No Bone Exposed: No Treatment Notes Wound #27 (Lower Leg) Wound Laterality: Right, Anterior, Distal Cleanser Soap and Water Discharge Instruction: May shower and wash wound with dial antibacterial soap and water prior to dressing change. Peri-Wound Care Sween Lotion (Moisturizing lotion) Discharge Instruction: Apply moisturizing lotion as directed Topical Primary Dressing Santyl Ointment Discharge Instruction: Apply to wound bed as instructed Secondary Dressing Woven Gauze Sponge, Non-Sterile 4x4 in Discharge Instruction: Apply over primary dressing as directed. ABD Pad, 8x10 Discharge Instruction: Apply over primary dressing as directed. Secured With Compression Wrap Kerlix Roll 4.5x3.1 (in/yd) Discharge Instruction: Apply Kerlix and Coban compression as directed. Coban Self-Adherent Wrap 4x5 (in/yd) Discharge Instruction: Apply over Kerlix as directed. Compression  Stockings Add-Ons Electronic Signature(s) Signed: 12/05/2020 2:51:42 PM By: Rhae Hammock RN Entered By: Rhae Hammock on 12/05/2020 14:51:41 -------------------------------------------------------------------------------- Wound Assessment Details Patient Name: Date of Service: [name] 12/05/2020 1:45 PM Medical Record Number: [id number] Patient Account Number: [id number] Date of Birth/Sex: Treating RN: [birth year] (69 y.o. [name], [name] Primary Care Brailey Buescher: [name] Other Clinician: Referring Ireene Ballowe: Treating Ellias Mcelreath/Extender: [name] in Treatment: [place] Wound Status Wound  Number: 5 Primary Venous Leg Ulcer Etiology: Wound Location: Right, Medial Lower Leg Wound Open Wounding Event: Gradually Appeared Status: Date Acquired: 01/13/2013 Comorbid Anemia, Hypertension, Peripheral Arterial Disease, Peripheral Weeks Of Treatment: 410 History: Venous Disease, Raynauds, Scleroderma, Rheumatoid Arthritis, Clustered Wound: Yes Osteoarthritis Wound Measurements Length: (cm) 2.2 Width: (cm) 2.5 Depth: (cm) 0.4 Area: (cm) 4.32 Volume: (cm) 1.728 % Reduction in Area: -205.5% % Reduction in Volume: -1125.5% Epithelialization: Small (1-33%) Tunneling: No Undermining: No Wound Description Classification: Full Thickness With Exposed Support Structures Wound Margin: Distinct, outline attached Exudate Amount: Medium Exudate Type: Purulent Exudate Color: yellow, brown, green Foul Odor After Cleansing: No Slough/Fibrino Yes Wound Bed Granulation Amount: Medium (34-66%) Exposed Structure Granulation Quality: Pink Fascia Exposed: No Necrotic Amount: Medium (34-66%) Fat Layer (Subcutaneous Tissue) Exposed: Yes Necrotic Quality: Adherent Slough Tendon Exposed: No Muscle Exposed: No Joint Exposed: No Bone Exposed: No Treatment Notes Wound #5 (Lower Leg) Wound Laterality: Right, Medial Cleanser Soap and Water Discharge  Instruction: May shower and wash wound with dial antibacterial soap and water prior to dressing change. Peri-Wound Care Sween Lotion (Moisturizing lotion) Discharge Instruction: Apply moisturizing lotion as directed Topical Primary Dressing Santyl Ointment Discharge Instruction: Apply to wound bed as instructed Secondary Dressing Woven Gauze Sponge, Non-Sterile 4x4 in Discharge Instruction: Apply over primary dressing as directed. ABD Pad, 8x10 Discharge Instruction: Apply over primary dressing as directed. Secured With Compression Wrap Kerlix Roll 4.5x3.1 (in/yd) Discharge Instruction: Apply Kerlix and Coban compression as directed. Coban Self-Adherent Wrap 4x5 (in/yd) Discharge Instruction: Apply over Kerlix as directed. Compression Stockings Add-Ons Electronic Signature(s) Signed: 12/05/2020 2:49:16 PM By: Rhae Hammock RN Entered By: Rhae Hammock on 12/05/2020 14:49:13 -------------------------------------------------------------------------------- Vitals Details Patient Name: Date of Service: [name], [name] 12/05/2020 1:45 PM Medical Record Number: [id number] Patient Account Number: [id number] Date of Birth/Sex: Treating RN: 1951-05-10 (69 y.o. [name], [name] Primary Care Maytal Mijangos: [name] Other Clinician: Referring Dorismar Chay: Treating Maximilliano Kersh/Extender: [name] in Treatment: 454 Vital Signs Time Taken: 14:19 Temperature (F): 98.4 Height (in): 68 Pulse (bpm): 101 Weight (lbs): 132 Respiratory Rate (breaths/min): 17 Body Mass Index (BMI): 20.1 Blood Pressure (mmHg): 175/77 Reference Range: 80 - 120 mg / dl Electronic Signature(s) Signed: 12/05/2020 5:19:12 PM By: Rhae Hammock RN Entered By: Rhae Hammock on 12/05/2020 14:19:59

## 2020-12-06 NOTE — Progress Notes (Signed)
[name] [name], [name] [name] ([id number]) Visit Report for 12/05/2020 Debridement Details Patient Name: Date of Service: [name] [name] [name] [name] 12/05/2020 1:45 PM Medical Record Number: [id number] Patient Account Number: [id number] Date of Birth/Sex: Treating RN: 04-Oct-1951 (69 y.o. [name] Primary Care Provider: [name] Other Clinician: Referring Provider: Treating Provider/Extender: [name] in Treatment: [phone] Debridement Performed for Assessment: Wound #20 Left,Lateral Malleolus Performed By: Physician Ricard Dillon., MD Debridement Type: Debridement Severity of Tissue Pre Debridement: Fat layer exposed Level of Consciousness (Pre-procedure): Awake and Alert Pre-procedure Verification/Time Out Yes - 15:13 Taken: Start Time: 15:13 Pain Control: Other : Benzocaine 20% T Area Debrided (L x W): otal 1 (cm) x 0.9 (cm) = 0.9 (cm) Tissue and other material debrided: Viable, Non-Viable, Slough, Subcutaneous, Slough Level: Skin/Subcutaneous Tissue Debridement Description: Excisional Instrument: Curette Bleeding: Minimum Hemostasis Achieved: Pressure End Time: 15:15 Procedural Pain: 0 Post Procedural Pain: 0 Response to Treatment: Procedure was tolerated well Level of Consciousness (Post- Awake and Alert procedure): Post Debridement Measurements of Total Wound Length: (cm) 1 Width: (cm) 0.9 Depth: (cm) 0.3 Volume: (cm) 0.212 Character of Wound/Ulcer Post Debridement: Requires Further Debridement Severity of Tissue Post Debridement: Fat layer exposed Post Procedure Diagnosis Same as Pre-procedure Electronic Signature(s) Signed: 12/05/2020 5:36:08 PM By: Levan Hurst RN, BSN Signed: 12/06/2020 9:01:39 AM By: Linton Ham MD Entered By: Linton Ham on 12/05/2020 15:26:13 -------------------------------------------------------------------------------- Debridement Details Patient Name: Date of Service: [name] [name] CK, [name] 12/05/2020 1:45  PM Medical Record Number: [id number] Patient Account Number: [id number] Date of Birth/Sex: Treating RN: [birth year] (69 y.o. [name] Primary Care Provider: [name] Other Clinician: Referring Provider: Treating Provider/Extender: [name] in Treatment: [phone] Debridement Performed for Assessment: Wound #25 Left,Lateral Foot Performed By: Physician Ricard Dillon., MD Debridement Type: Debridement Severity of Tissue Pre Debridement: Fat layer exposed Level of Consciousness (Pre-procedure): Awake and Alert Pre-procedure Verification/Time Out Yes - 15:13 Taken: Start Time: 15:13 Pain Control: Other : Benzocaine 20% T Area Debrided (L x W): otal 1.1 (cm) x 0.7 (cm) = 0.77 (cm) Tissue and other material debrided: Viable, Non-Viable, Slough, Subcutaneous, Slough Level: Skin/Subcutaneous Tissue Debridement Description: Excisional Instrument: Curette Bleeding: Minimum Hemostasis Achieved: Pressure End Time: 15:15 Procedural Pain: 0 Post Procedural Pain: 0 Response to Treatment: Procedure was tolerated well Level of Consciousness (Post- Awake and Alert procedure): Post Debridement Measurements of Total Wound Length: (cm) 1.1 Width: (cm) 0.7 Depth: (cm) 0.1 Volume: (cm) 0.06 Character of Wound/Ulcer Post Debridement: Requires Further Debridement Severity of Tissue Post Debridement: Fat layer exposed Post Procedure Diagnosis Same as Pre-procedure Electronic Signature(s) Signed: 12/05/2020 5:36:08 PM By: Levan Hurst RN, BSN Signed: 12/06/2020 9:01:39 AM By: Linton Ham MD Entered By: Linton Ham on 12/05/2020 15:26:25 -------------------------------------------------------------------------------- HPI Details Patient Name: Date of Service: [name] [name] CK, [name] 12/05/2020 1:45 PM Medical Record Number: [id number] Patient Account Number: [id number] Date of Birth/Sex: Treating RN: 21-Jul-1951 (69 y.o. [name] Primary Care  Provider: [name] Other Clinician: Referring Provider: Treating Provider/Extender: [name] in Treatment: [phone] History of Present Illness HPI Description: this is a patient who initially came to [place] for wounds on the medial malleoli bilaterally as well as her upper medial lower extremities bilaterally. These wounds eventually healed with assistance of Apligraf's bilaterally. While this was occurring she developed the current wound which opened into a fairly substantial wound on the right lower extremity. These are mostly secondary to venous  stasis physiology however the patient also has underlying scleroderma, pulmonary hypertension. The wound has been making good progress lately with the Hydrofera Blue-based dressing. 03/17/2015; patient had a arterial evaluation a year ago. Her right ABI was 0.86 left was 1.0. T brachial index was 0.41 on the right 0.45 on the left. Her oe bilateral common femoral artery waveforms were triphasic. Her white popliteal posterior tibial artery and anterior tibial artery waveforms were monophasic with good amplitude. Luteal artery waveforms were biphasic it was felt that her bilateral great toe pressures are of normal although adequate for tissue healing. 03/24/2015. The condition of this wound is not really improved that. He was covered with as fibrinous surface slough and eschar. This underwent an aggressive debridement with both a curette and scalpel. I still cannot really get down to what I can would consider to be a viable surface. There is no evidence of infection. Previous workup for ischemia roughly a year ago was negative nevertheless I think that continues to be a concern 04/07/15. The patient arrived for application of second Apligraf. Once again the surface of this wound is certainly less viable than I would like for an advanced treatment option. An aggressive debridement was done. She developed some arteriolar bleeding which  required that along pressure and silver alginate. 04/21/15: change in the condition of this wound. Once again it is covered in a gelatinous surface slough. After debridement today surface of the wound looks somewhat better but now a heeling surface 05/05/15 Apligraf #4 applied.Still a lot of slough on this wound. 05/19/15 Apligraf #5 applied. Still a lot of slough on this wound I did not aggressively remove this 06/02/15; continued copious amounts of surface slough. This debridement fairly easily. 06/08/2015 -- the last time she had a venous duplex study done was over 3 years ago and the surgery was prior to that. I have recommended that she sees Dr. early for a another opinion regarding a repeat venous duplex and possibly more endovenous ablation of vein stripping of micro-phlebotomies. 06/16/15; wound has a gelatinous surface eschar that the debridement fairly easily to a point. I don't disagree with the venous workup and perhaps even arterial re-evaluation. She is on prednisone 5 mg and continue his medications for her pulmonary artery hypertension I am not sure if the latter have any wound care healing issues I would need to investigate this. 06/23/15 continues with a gelatinous surface eschar with of fibrinous underlying. What I can see of this wound does not look unhealthy however I just can't get this material which I think is 2 different layers off. Empiric culture done 06/30/15; unfortunately not a lot of change in this wound. A gelatinous surface eschar is easily removed however it has a tight fibrinous surface underneath the. 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 The wound comes back and basically and unchanged state the. She has a gelatinous surface that is more easily removed however there is a tightly fibrinous surface underneath the. There is no evidence of infection. She has a vascular follow-up next month. I would have to inject her in order to do a more aggressive  debridement of this area 07/21/15 the wound is roughly in the same state albeit the debridement is done with greater ease. There is less of the fibrinous eschar underneath the. There is no evidence of infection. She has follow-up with vascular surgery next week. No evidence of surrounding infection. Her original distal wounds healed while this one formed. 07/28/15;  wound is easier to debride. No wound erythema. She is seeing Dr. [name] next week. 08/18/15 Has been to [place] for repeat ablation. Have been using medihoney pad with some improvement 08/25/15; absolutely no change in the condition of this wound in either its overall size or surface condition in many months now. At one point I had this down to [place] healthier surface I think with [facility] however this did not actually progress towards closure. Do not believe that the wound has actually deteriorated in terms of volume at all. We have been using a medihoney pad which allows easier debridement of the gelatinous eschar but again no overall actual improvement. the patient is going towards an ablation with pain and vascular which I think is scheduled for next week. The only other investigation that I could first see would be a biopsy. She does have underlying scleroderma 09/02/15 eschar is much easier to debridement however the base of this does not look particularly vibrant. We changed to Iodoflex. The patient had her ablation earlier this week 09/09/15 again the debridement over the base of this wound is easier and the base of the wound looks considerably better. We will continue the Iodoflex. Dr. Tawni Millers has expressed his satisfaction with the result of her ablation 09/15/15 once again the wound is relatively free of surface eschar. No debridement was done today. It has a pale-looking base to it. although this is not as deep as it once was it seems to be expanding especially inferiorly. She has had recent venous ablation but this is no  closer to healing.I've gone ahead and done a punch biopsy this from the inferior part of the wound close to normal skin 09/22/15: the wound is relatively free of surface eschar. There is some surrounding eschar. I'm not exactly sure at what level the surface is that I am seeing. 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 No evidence of atypical infection, malignancy. 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 No debridement was done 09/29/15; the wound surface is relatively free of surface eschar. There is eschar surrounding the walls of the wound. Aside from the improvement in the amount of surface slough. This wound has not progressed any towards closure. There is not even a surface that looks like there at this is ready. There is no evidence of any infection nor maligancy based on biopsy I did on 9/15. I continued with the Iodoflex however I am looking towards some alternative to try to promote some closure or filling in of this surface. Consider triple layer Oasis. Collagen did not result in adequate control of the surface slough 10/13/15; the patient was in hospital last week with severe anemia. The wound looks somewhat better after debridement although there is widening medially. There is no evidence of infection. 10/20/15; patient's wound on the right lateral lower leg is essentially unchanged. This underwent a light surface debridement and in general the debridement is easier and the surface looks improved. I noted in doing this on the side of the wound what appears to be a piece of calcium deposition. The patient noted that she had previous things on the tips of her fingers. 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 She follows with rheumatology and I have asked them to talk to her about this. In view of that S the nonhealing ulcer may have something to do with  calcinosis and also unrelieved Raynaud's  disease in this area. I should note that her original wounds on the right and left medial malleolus and the inner aspects of both legs just below the knees did however heal over 10/30/15; the patient's wound on the right lateral lower leg is essentially unchanged. I was able to remove some calcified material from the medial wound edge. 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 Otherwise the wound appears essentially unchanged there is less adherent surface eschar. Some of the calcified material was sent to pathology for analysis 11/17/15. The patient's wound on the right lateral leg is essentially unchanged. Wider Medially. 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 There is some surface slough but I did not debridement this. No evidence of surrounding infection. 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 This will make healing difficult 12/15/15; the wound has less depth surface has a fibrinous slough and calcifications in the wound edge no evidence of infection 12/22/15; the wound definitely has less Fibrinous slough on the base. Calcifications around the wound edges are still evident. Although the wound bed looks healthier it is still pale in appearance. Previous biopsy did not show malignancy 01/04/15; surgical debridement of nonviable slough and subcutaneous tissue the wound cleans up quite nicely but appears to be expanding outward calcifications around the wound edges are still there. 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 She is to see her rheumatologist I'll see if he has any  opinions on this. My punch biopsy done in September did not show calcifications although these are clearly evident. 01/19/16 light selective debridement of nonviable surface slough. There is epithelialization medially. This gives me reason for cautious optimism. She has been to see her rheumatologist, there is nothing that can be done for this type of soft tissue calcification associated with scleroderma 02/02/16 no debridement although there is a light surface slough. She has 2 peninsulas of skin 1 inferiorly and one medially. We continue to make a slight and slow but definite progressive here 02/16/16; light surface debridement with more attention to the circumference of the wound bed where the fibrinous eschar is more prevalent. No calcifications detected. She seems to have done nicely with the [facility] with some epithelialization and some improvement in the overall wound volume. 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 Continued attention to the circumference of the wound where the fibrinous eschar in calcinosis or prevalent. No calcifications were detected. She has continued improvement with Hydrofera Blue. The wound is no longer as deep 03/15/16 surgical debridement done to remove surface escha Especially around the circumference of the wound where there is nonviable subcutaneous tissue. In spite of this there is considerable improvement in the overall dimensions and depth of the wound. Islands of epithelialization are seen especially medially inferiorly and superiorly to a lesser extent. She is using Hydrofera Blue at home 03/29/16; surgical debridement done to remove surface eschar and nonviable subcutaneous tissue. This cleans up quite nicely mention slightly larger in terms of length and width however depth is less 04/12/16; continued gradual improvement in  terms of depth and the condition  of the wound base. Debridement is done. Continuing long standing Hydrofera Blue at home with Kerlix Coban wraps 04/26/16; continued gradual improvement in terms of depth and management as well as condition of the wound base. Surgical debridement done she continues with Hydrofera Blue. 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 She initially came to this clinic venous insufficiency ulcers which have long since healed 05/17/16 continued improvement in terms of the depth and measurements of this wound although she has a tightly adherent fibrinous slough each time. We've been continued with long standing Hydrofera Blue which seems to done as well for this wound is many advanced treatment options. 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 She also has chronic venous insufficiency. She has an irritation on her lateral right ankle secondary to our wraps 05/10/16; wound appears to be smaller especially on the medial aspect and especially in the width. Wound was debridement surface looks better. She is also been in the hospital apparently with anemia again she tells me she had an endoscopy. Since she got home after 3 days which I believe was sometime last week she has had an irritated painful area on the right lateral ankle surrounding the lateral malleolus 05/31/16; much more adherent surface slough today then recently although I don't think the dimensions of changed that much. A more aggressive debridement is required. The irritated area over her medial malleolus is more pruritic and painful and I don't think represents cellulitis 06/14/16; no major change in her wound dimensions however there is more tightly adherent surface slough which is disappointing. As well as she appears to have a new small area medially. Furthermore an irritated uncomfortable area on the lateral aspect of her right foot just below  the lateral malleolus. 06/21/16; I'm seeing the patient back in follow-up for the new areas under her major wound on the right anterior leg. She has been using Hydrofera Blue to this area probably for several months now and although the dressings seem to be helping for quite a period of time I think things have stagnated lately. She comes in today with a relatively tight adherent surface slough and really no changes in the wound shape or dimension. The 2 small areas she had inferiorly are tiny but still open they seem improved this well. There is no uncontrolled edema and I don't think there is any evidence of cellulitis. 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 Patient has had vascular evaluations both venous and arterial. I have biopsied this area. There is no obvious infection. The worrisome thing today is that she seems to be developing areas of erythema and epithelial damage on the medial aspect of the right foot. Also to a lesser degree inferior to the actual wound itself. Again I see no obvious changes to suggest cellulitis however as this is the only treatable option I will probably give her antibiotics. 07/13/16 no major change in the lady's large anterior right leg wound. Still covered with a very tightly adherent surface slough which is difficult to debridement. There is less erythema around this, culture last week grew pseudomonas I gave her ciprofloxacin. The area on her lateral right malleolus looks better- 08/02/16 the patient's wounds continued to decline. Her original large anterior right leg wound looks deeper. Still adherent surface slough that is difficult to debridement. She has a  small area just below this and a punched-out wound just below her lateral malleolus. In the meantime she is been in hospital with apparently an upper GI bleed on Plavix and aspirin. She is now just on  Plavix she received 3 units of packed cells 08/23/16; since I last saw this 3 weeks ago, the open large area on her right leg looks about the same syrup. She has a small satellite lesion just underneath this. The area on her medial right ankle is now a deep necrotic wound. I attempted to debridement this however there is just too much pain. 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 She follows with vascular surgery and is scheduled for an angiogram in early September 09/06/16; the patient is going for an arteriogram tomorrow. Her original large wound on the right calf is about the same the satellite lesion underneath it is about the same however the area on her medial ankle is now deeper with exposed tendon. I am no longer attempting to debride these wounds 09/20/16; the patient has undergone a right femoral endarterectomy and Dacron patch angioplasty. This seems to have helped the flow in her right leg. 10/04/16; Arrived today for aggressive debridement of the wounds on her right calf the original wound the one beneath it and a difficult area over her right lateral leg just above the lateral malleolus. 10/25/16; her 3 open wounds are about the same in terms of dimensions however the surface appears a lot healthier post debridement. Using Iodoflex 10/18/16 we have been using Iodoflex to her wounds which she tolerated with some difficulty. 10/11/16; has been using Santyl for a period of time with some improvement although again very adherent surface slough would prevent any attempted healing this. She has a original wound on the left calf, the satellite underneath that and the most recent wound on the right medial ankle. She has completed revascularization by Dr. early and has had venous ablation earlier. Want to go back to Iodoflex to see if week and get a healthier surface to this wound bed failing this I think she'll need to be  taken to the OR and I am prepared to call Dr. Marla Roe to discuss this. She is obviously not a good candidate for general anesthesia however.; 11/08/16; I put her on Iodoflex last time to see if I can get the wound bed any healthier and unfortunately today still had tremendous surface slough. 11/15/16; 4 weeks' worth of Iodoflex with not much improvement. Debridement on the major wounds on her left anterior leg is easier however this does not maintain from week to week. The punched-out area on her medial right ankle 11/29/16; I attempted to change the patient last visit to [facility] however she states this burned and was very uncomfortable therefore we gave her permission to go back to the eye out of complex which she already had at home. Also she noted a lot of pain and swelling on the lateral aspect of her leg before she traveled to [facility] for the holidays, I called her in doxycycline over the phone. This seems to have helped 12/06/16; Wounds unchanged by in large. Using Iodoflex 12/13/16; her wounds today actually looks somewhat better. The area on the right lateral lower leg has reasonably healthy-looking granulation and perhaps as actually filled and a bit. Debridement of the 2 wounds on the medial calf is easier and post debridement appears to have a  healthier base. We have referred her to Dr. Migdalia Dk for consideration of operative debridement 12/20/16; we have a quick note from Dr. Merri Ray who feels that the patient needs to be referred to an academic center/plastic surgeon. This is due to the complexity of the patient's medical issues as it applies to anything in the OR. We have been using Iodoflex 12/27/16; in general the wounds on the right leg are better in terms of the difficult to remove surface slough. She has been using Iodoflex. She is approved for Apligraf which I anticipated ordering in the next week or 2 when we get a better-looking surface 01/04/16  the deep wounds on the right leg generally look better. Both of them are debrided further surface slough. The area on the lateral right leg was not debrided. She is approved for Apligraf I think I'll probably order this either next week or the week after depending on the surface of the wounds superiorly. We have been using Iodoflex which will continue until then 01/11/16; the deep wounds on the right leg again have a surface slough that requires debridement. I've not been able to get the wound bed on either one of these wounds down to what would be acceptable for an advanced skin stab-like Apligraf. The area on the medial leg has been improving. We have been using hot Iodoflex to all wounds which seems to do the best at at least limiting the nonviable surface 01/24/17; we have continued Iodoflex and all her wound areas. Her debridement Gen. he is easier and the surface underneath this looks viable. Nevertheless these are large area wounds with exposed muscle at least on the anterior parts. We have ordered Apligraf's for 2 weeks from now. The patient will be away next week 02/07/17; the patient was close to have first Apligraf today however we did not order one. I therefore replaced her Iodoflex. She essentially has 3 large punched- out areas on her right anterior leg and right medial ankle. 02/11/17; Apligraf #1 02/25/17; Apligraf #2. In general some improvement in the right medial ankle and right anterior leg wounds. The larger wound medially perhaps some better 03/11/17; Apligraf #3. In general continued improvement in the right medial ankle and the right anterior leg wounds. 03/25/17 Apligraf #4. In general continued improvement especially on the right medial ankle and the lower 04/08/17; Apligraf #5 in general continued improvement in all wound sites. 04/22/17; post Apligraf #5 her wound beds continued to look a lot better all of them up to the surface of the surrounding skin. Had a caramel-colored slough  that I did not debrided in case there is residual Apligraf effect. The wounds are as good as I have seen these looking quite some time. 04/29/17; we applied [place] and last week after completing her Apligraf. Wounds look as though they've contracted somewhat although they have a nonviable surface which was problematic in the past. Apparently she has been approved for further Apligraf's. We applied Iodosorb today after debridement. 05/06/17; we're fortunate enough today to be able to apply additional Apligraf approved by her insurance. In general all of her wounds look better Apligraf #6 05/24/17; Apligraf #7 continued improvement in all wounds 3 06/10/17 Apligraf #8. Continued improvement in the surface of all wounds. Not much of an improvement in dimensions as I might a follow 06/24/17 Apligraf #9 continued general improvement although not as much change in the wound areas I might of like. She has a new open area on the right anterior lateral ankle  very small and superficial. She also has a necrotic wound on the tip of her right index finger probably secondary to severe uncontrolled Raynaud's phenomenon. She is already on sildenafil and already seen her rheumatologist who gave her Keflex. 07/08/17; Apligraf #10. Generalized improvement although she has a small additional wound just medial to the major wound area. 07/22/17; after discussion we decided not to reorder any further Apligraf's although there is been considerable improvement with these it hasn't been recently. The major wound anteriorly looks better. Smaller wounds beneath this and the more recent one and laterally look about the same. The area on the right lateral lower leg looks about the same 07/29/17 this is a patient who is exceedingly complex. She has advanced scleroderma, crest syndrome including calcinosis, PAD status post revascularization, chronic venous insufficiency status post ablations. She initially presented to this clinic  with wounds on her bilateral lower legs however these closed. More recently we have been dealing with a large open area superiorly on the right anterior leg, a smaller wound underneath this and then another one on the right medial lower extremity. These improve significantly with 10 Apligraf applications. Over the last 2-3 weeks we are making good progress with Hydrofera Blue and these seem to be making progress towards closure 08/19/17; wounds continued to make good improvement with Hydrofera Blue and episodic aggressive debridements 08/26/17; still using Hydrofera Blue. Good improvements 09/09/17; using Hydrofera Blue continued improvement. Area on the lateral part of her right leg has only a small remaining open area. The small inferior satellite region is for all intents and purposes closed. Her major wound also is come in in terms of depth and has advancing epithelialization. 09/16/17; using Hydrofera Blue with continued improvement. The smaller satellite wound. We've closed out today along with a new were satellite wound medially. The area on her medial ankle is still open and her major wound is still open but making improvement. All using Hydrofera Blue. Currently 09/30/17; using Hydrofera Blue. Still a small open area on the lateral right ankle area and her original major wound seems to be making gradual and steady improvement. 10/14/17; still using Hydrofera Blue. Still too small open areas on the right lateral ankle. Her original major wound is horizontal and linear. The most problematic area paradoxically seems to be the area to the medial area wears I thought it would be the lateral. 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 10/28/17; still using Hydrofera Blue. Right lateral ankle has a very small open area superiorly on the most lateral part of the wound. Her original open wound has 2 open areas now separated by normal skin and we've  redefined this. 11/11/17; still using Hydrofera Blue area and right lateral ankle continues to have a small open area on mostly the lateral part of the wound. Her original wound has 2 small open areas now separated by a considerable amount of normal skin 11/28/17; the patient called in slightly before Thanksgiving to report pain and erythema above the wound on the right leg. In the past this is responded well to treatment for cellulitis and I gave her over the phone doxycycline. She stated this resulted in fairly abrupt improvement. We have been using Hydrofera Blue for a prolonged period of time to the larger wound anteriorly into the remaining wound on her right right lateral ankle. The latter is just about closed with only a small linear area and the bottom of the [place]. 12/02/17;  use endoform and left the dressing on since last visit. There is no tenderness and no evidence of infection. 12/16/17; patient has been using Endoform but not making much progress. The 2 punched out open areas anteriorly which were the reminiscence of her major wound appear deeper. The area on the lateral aspect of her right calf also appears deeper. Also she has a puzzling tender swelling above her wound on the right leg. This seems larger than last time. Just above her wounds there appears to be some fluctuance in this area it is not erythematous and there is no crepitus 12/30/17; patient has been using Endoform up until last week we used Hydrofera Blue. Ultrasound of the swelling above her 2 major wounds last time was negative for a fluid collection. I gave her cefaclor for the erythema and tenderness in this area which seems better. Unfortunately both punched out areas anteriorly and the area on her right medial lower leg appear deeper. In fact the lateral of the wounds anteriorly actually looks as though it has exposed tendon and/or muscle sheath. She is not systemically unwell. She is complaining of vaginitis type  symptoms presumably Candida from her antibiotics. 01/06/18; we're using santyl. she has 2 punched out areas anteriorly which were initially part of a large wound. Unfortunately medially this is now open to tendon/muscle. All the wounds have the same adherent very difficult to debride surface. 01/20/18; 2 week follow-up using Santyl. She has the 2 punched out areas anteriorly which were initially part of her large surface wound there. Medially this still has exposed muscle. All of these have the same tightly adherent necrotic surface which requires debridement. PuraPly was not accepted by the patient's insurance however her insurance I think it changed therefore we are going to run Apligraf to gain 02/03/18; the patient has been using Santyl. The wound on the right lateral ankle looks improved and the 2 areas anteriorly on the right leg looks about the same. The medial one has exposed muscle. The lateral 1 requiresdebridement. We use PuraPly today for the first week 02/10/18; PuraPly #2. The patient has 3 wounds. The area on the right lateral ankle, 2 areas anteriorly that were part of her original large wound in this area the medial one has exposed muscle. All of the wounds were lightly debrided with a number 3 curet. PuraPly #2 applied the lateral wound on the calf and the right lateral ankle look better. 02/17/18; PuraPly #3. Patient has 3 wounds. The area on the right lateral ankle in 2 areas internally that were part of her original large wound. The lateral area has exposed muscle. She arrived with some complaints of pain around the right ankle. 02/24/18; PuraPly #4; not much change in any of the 3 wound areas. Right lateral ankle, right lateral calf. Both of these required debridement with a #3 curet. She tolerates this marginally. The area on the medial leg still has exposed muscle. Not much change in dimensions 03/03/18 PuraPly #5. The area on the medial ankle actually looks better however the 2 separate  areas that were original parts of the larger right anterior leg wound look as though they're attempting to coalesce. 03/10/18; PuraPly #6. The area on the medial ankle actually continues to look and measures smaller however the 2 separate areas that were part of the original large wound on the right anterior leg have now coalesced. There hasn't been much improvement here. The lateral area actually has underlying exposed muscle 03/17/18-she is here in follow-up  evaluation for ulcerations to the right lower extremity. She is voicing no complaints or concerns. She tolerated debridement. Puraply#7 placed 03/24/18; difficult right lower extremity ulcerations. PuraPly #8 place. She is been approved for [employer]. She did very well with Apligraf today however she is apparently reached her "lifetime max" 03/31/18; marginal improvement with PuraPly although her wounds looked as good as they have in several weeks today. Used TheraSkins #1 04/14/18 TheraSkin #2 today 04/28/18 TheraSkins #3. Wound slightly improved 05/12/18; TheraSkin skin #4. Wound response has been variable. 05/27/18 TheraSkin #5. Generally improvement in all wound areas. I've also put her in 3 layer compression to help with the severe venous hypertension 06/09/18; patient is done quite well with the TheraSkins unfortunately we have no further applications. I also put her in 3 layer compression last week and that really seems to of helped. 06/16/18; we have been using silver collagen. Wounds are smaller. Still be open area to the muscle layer of her calf however even that is contracted somewhat. She tells me that at night sometimes she has pain on the right lateral calf at the site of her lower wound. Notable that I put her into 3 layer compression about 3 weeks ago. She states that she dangles her leg over the bed that makes it feel better but she does not describe claudication during the day She is going to call her secondary insurance to see if  they will continue to cover advanced treatment products I have reviewed her arterial studies from 01/22/17; this showed an ABI in the right of 1 and on the left noncompressible. TBI on the right at 0.30 on the left at 0.34. It is therefore possible she has significant PAD with medial calcification falsely elevating her ABI into the normal range. I'll need to be careful about asking her about this next week it's possible the 3 layer compression is too much 06/23/18; was able to reapply TheraSkin 1 today. Edema control is good and she is not complaining of pain no claudication 07/07/18;no major change. New wound which was apparently a taper removal injury today in our clinic between her 2 wounds on the right calf TheraSkins #2 07/14/18; I think there is some improvement in the right lateral ankle and the medial part of her wound. There is still exposed muscle medially. 07/28/18; two-week follow-up. TheraSkins#3. Unfortunately no major change. 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 She seems to be doing well and in the base of the wound appears to show some progress at this point. This is the last approved [employer]in of the second set. 08/25/18; she has completed TheraSkin. There has been some improvement on the right lateral calf wound as well as the anterior leg wounds. The open area to muscle medially on the anterior leg wound is smaller. I'm going to transition her back to [facility] under Kerlix Coban change every second day. She reports that she had some calcification removed from her right upper arm. We have had previous problems with calcifications in her wounds on her legs but that has not happened recently 09/08/18;using Hydrofera Blue on both her wound areas. Wounds seem to of contracted nicely. She uses  Kerlix Coban wrap and changes at home herself 09/22/2018; using Hydrofera Blue on both her wound areas. Dimensions seem to have come down somewhat. There  is certainly less depth in the medial part of the mid tibia wound and I do not think there is any exposed muscle at this point. 10/06/2018; 2-week follow-up. Using [facility] on her wound areas. Dimensions have come down nicely both on the right lateral ankle area in the right mid tibial area. She has no new complaints 10/20/2018; 2-week follow-up. She is using Hydrofera Blue. Not much change from the last time she was here. The area on the lateral ankle has less depth although it has raised edges on one side. I attempted to remove as much of the raised edge as I could without creating more additional wounds. The area on the right anterior mid tibia area looks the same. 11/03/2018; 2-week follow-up she is using Hydrofera Blue. On the right anterior leg she now has 2 wounds separated by a large area of normal skin. The area on the medial part still has I think exposed muscle although this area itself is a lot smaller. The area laterally has some depth. Both areas with necrotic debris. The area on her right lateral ankle has come in nicely 11/17/2018; patient continues to use Hydrofera Blue. We have been increasing separation of the 2 wounds anteriorly which were at one point joint the area on the right lateral calf continues to have I think some improvement in depth. 12/08/2018; patient continues to use Hydrofera Blue. There is some improvement in the area on the right lateral calf. The 2 areas that were initially part of the original large wound in the mid right tibia are probably about the same. In fact the medial area is probably somewhat larger. We will run puraply through the patient's insurance 12/22/2018; she has been using Hydrofera Blue. We have small wounds on the right lateral calf and 2 small areas that were initially part of a large  wound in the right mid tibia. We applied pure apply #1 today. 12/29/2018; we applied puraply #2. Her wounds look somewhat better especially on the right lateral calf and the lateral part of her original wound in the mid tibial area. 01/05/2019; perhaps slightly improved in terms of wound bed condition but certainly not as much improvement as I might of liked. Puraply #3 1/13: we did not have a correct sized puraply to apply. wounds more pinched out looking, I increased her compression to 3 layer last week to help with significant multilevel venous hypertension. Since then I've reviewed her arterial status. She has a right femoral endarterectomy and a distal left SFA stent. She was being followed by Dr. [name] for a period however she does not appear to have seen him in 3 years. I will set up an appointment. 1/20. The patient has an additional wound on the right lateral calf between the distal wound and proximal wounds. We did not have Puraply last week. Still does not have a follow-up with Dr. early 1/27: Follow-up with Dr. early has been arranged apparently with follow-up noninvasive studies. Wounds are measuring roughly the same although they certainly look smaller 2/3; the patient had non invasive studies. Her ABIs on the right were 0.83 and on the left 1.02 however there was no great toe pressure bilaterally. Also worrisome monophasic waveforms at the PTA and dorsalis pedis. We are still using Puraply. We have had some improvement in all of the wounds especially the lateral part of the mid tibial area. 2/10; sees Dr. early of vein and vascular re-arterial studies next week. Puraply reapplied today. 2/17; Dr. early of vein  and vascular his appointment is tomorrow. Puraply reapplied after debridement of all wounds 2/24; the patient saw Dr. early I reviewed his note. sHe noted the previous right femoral endarterectomy with a Dacron patch. He also noted the normal ABI and the monophasic waveforms  suggesting tibial disease. Overall he did not feel that she had any evidence of arterial insufficiency that would impair her wound healing. He did note her venous disease as well. He suggested PRN follow-up. 3/2; I had the last puraply applied today. The original wounds over the mid tibia area are improved where is the area on the right lower leg is not 3/9; wounds are smaller especially in the right mid tibia perhaps slightly in the right lateral calf. We finished with puraply and went to endoform today 3/23; the patient arrives after 2 weeks. She has been using endoform. I think all of her wounds look slightly better which includes the area on the right lateral calf just above the right lateral malleolus and the 2 in the right mid tibia which were initially part of the same wound. 4/27; TELEHEALTH visit; the patient was seen for telehealth visit today with her consent in the middle of the worldwide epidemic. Since she was last here she called in for antibiotics with pain and tenderness around the area on the right medial ankle. I gave her empiric doxycycline. She states this feels better. She is using endoform on both of these areas 5/11 TELEHEALTH; the patient was seen for telehealth visit today. She was accompanied at home by her husband. She has severe pulmonary hypertension accompanied scleroderma and in the face of the Covid epidemic cannot be safely brought into our clinic. We have been using endoform on her wound areas. There are essentially 3 wound areas now in the left mid tibia now 2 open areas that it one point were connected and one on the right lateral ankle just above the malleolus. 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 She cannot come to clinic. The wounds were reviewed today via telehealth. She has severe  chronic venous hypertension which I think is centrally mediated. She has wounds on her right anterior tibia and right lateral ankle area. These are chronic. She has been using endoform. 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 We have been following her from telehealth. She has severe chronic venous hypertension which may be mostly centrally mediated secondary to right heart heart failure. She has wounds on her right anterior tibia and right lateral ankle these are chronic we have been using endoform 6/22; TELEHEALTH; this patient was seen today via telehealth. 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 Since the last time we had contact with this patient she developed some pain and erythema around the wound on her right lateral malleolus/ankle and we put in antibiotics for her. This is resulted in good improvement with resolution of the erythema and tenderness. I changed her to silver alginate last time. We had been using endoform for an extended period of time 7/13; TELEHEALTH; this patient was seen today via telehealth. 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 She  would continue to be at a prohibitive risk to be brought into our clinic unless this was absolutely necessary. These visits have been done with her approval as well as her husband. We have been using silver alginate to the areas on the right mid tibia and right lateral lower leg. 7/27 TELEHEALTH; patient was seen along with her husband today via telehealth. 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 We changed her to sample last visit. She has 2 areas a chronic wound on her right mid tibia  and one just above her ankle. These were not the original wounds when she came into this clinic but she developed them during treatment 8/17; she comes in for her first face-to-face visit in a long period. She has a remaining area just medial to the right tibia which is the last open part of her large wound across this area. She also has an area on the right lateral lower leg. We prescribed Santyl last telehealth visit but they were concerned that this was making a deeper so they put silver alginate on it last week. Her husband changes the dressings. 8/31; using Santyl to the 2 wound areas some improvement in wound surfaces. Husband has surgery in 2 weeks we will put her out 3 weeks. Any of the advanced treatment options that I can think of that would be eligible for this wound would also cause her to have to come in weekly. The risk that the patient is just too high 9/21. Using Santyl to the 2 wound areas. Both of these are somewhat better although the medial mid tibia area still has exposed muscle. Lateral ankle requiring debridement. Using Santyl 10/12; using Santyl to the 2 wound areas. One on the right lateral ankle and the other in the medial calf which still has exposed muscle. Both areas have come down in size and have better looking surfaces. She has made nice progress with santyl 11/2; we have been using Santyl to the 2 wound areas. Right lateral ankle and the other in the mid tibia area the medial part of this still has exposed muscle. 11/23/2019 on evaluation today patient appears to be doing about the same really with regard to her wounds. She is actually not very pleased with how things seem to be progressing at this point she tells me that she really has not noted much improvement unfortunately. With that being said there is no signs of active infection at this time. There is some slough buildup noted at this time which again along with some dry skin around the edges of the wound I  think would benefit her to try to debride some of this away. Fortunately her pain is doing fairly well. She still has exposed muscle in the right medial/tibial area. 12/14; TELEHEALTH; she was changed to [facility] to the right calf wound and right lateral ankle wound when she was here last time. Unfortunately since then she had a fall with a pelvic fracture and a fracture of her wrist. She was apparently hospitalized for 5 days. I have not looked at her discharge summary. She apparently came out of the hospital with a blister on her right heel. She was seen today via telehealth by myself and our case manager. The patient and her husband were present. She has been using Hydrofera Blue at our direction from the last time she was in the clinic. There is been no  major improvement in fact the areas appear deeper and with a less viable surface 01/04/2020; TELEHEALTH; the patient was seen today in accompaniment of her husband and our nurse. She has 3 open areas 1 on the right medial mid tibia, one on the right lateral ankle and a large eschared pressure area on the right heel. We have been using Iodoflex to the 2 original wounds. 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 It is simply too perilous for her to be seen in any other way 1/26; TELEHEALTH; the patient was seen today in accompaniment of her husband and our RN one of our nurses. She still has the 3 open areas 1 on the right medial mid tibia which is the remanent of a more extensive wound in this area, one on the right lateral ankle and a large eschared pressure area on the right heel. We have been using Iodoflex to the 2 original wounds and a bed at 9 application to the eschared area on the heel 2/15; the first time we have seen this patient and then several months out of concern for the pandemic. She had a large horizontal wound in the mid tibia. Only the medial aspect of this is still open. Area on the lateral  ankle is just about closed. She had a new pressure ulcer on the right heel which I have removed some of the eschar. We have been using Iodoflex which I will continue. The area in the mid tibia has a round circle in the middle of exposed muscle. I think we would have to use an advanced treatment product to stimulate granulation over this area. We will run this through her insurance. She is not eligible for plastic surgery for many reasons 3/1; TELEHEALTH. The patient was seen today by telehealth. 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 We have been using Iodoflex to the wound areas which include the right anterior mid tibia, right lateral ankle and the right heel. All of these were reviewed 3/8; the patient's wound just above the right ankle is closed. She still has a contracting black eschar on the heel where she had a pressure ulcer. The medial part of her original wound on the mid tibia has exposed muscle. We have made really made no progress in this area although we have managed to get a lot of the original wound in this area to close. We used Apligraf #1 today 3/22; the patient's ankle wound remains closed. She still has a contracting black eschar on the heel although it seems to have less surface area. It still not clear whether there is any depth here. We used Apligraf #2 today in an attempt to get granulation over the exposed muscle and what is left of her mid tibia area. 4/5; everything is closed except the medial aspect of the mid tibia wound, as well as the pressure injury on the right heel. She has been using Betadine to the right heel which has been gradually contracting. We used Apligraf #3 today. 4/19; Apligraf #4. Still a pressure area on the right heel she has been using Betadine superficial excoriated areas she has been applying salicylic acid to on the anterior leg and the thick horn  on the leg just above her wound area 5/3; Apligraf #5. Unfortunately she came in today with a reopening of the area on the right lateral lower leg. Not much change in the  wound we have been treating in the mid calf. Quite a bit of edema surrounding this wound. She reports that she was up on her feet quite a bit. The area on the right heel is separating she has been using Betadine She comes in with a new wound on the left lateral malleolus which is very disappointing this is been open since last week 5/17; we applied her last Apligraf last time. Unfortunately that did not have any effect on the deep area medially in the mid tibial area. However the area over the right lateral malleolus is a lot better. Right heel also is contracted we used Iodoflex last time. The new wound on the left lateral malleolus were using Santyl to. This required debridement. 6/1. Not much change in the area on the right medial mid tibia. Right lateral ankle is improved she has the necrotic area on the right heel which was a pressure area. New area on the left lateral malleolus from last time. I changed her to Iodoflex to the area on the left lateral malleolus she said this hurt I have put her back on Santyl 6/15; right mid tibia is unchanged. I do not think there is anything I can do to this topically that we will get this to granulate over the muscle. And I am furthermore I am not sure that she is a surgical candidate either because of her severe pulmonary issues or because she really does not want to go through it. The area on the right lateral malleolus is a small punched out area. The area on the left lateral malleolus again small punched out with nonviable surface Pressure ulcer on the right heel at separating black eschar. I went ahead and remove this today she has an area on the left anterior mid tibia just laterally. Geographic wounds debris on the surface. Some erythema here. I think this is developing because of  chronic venous/lymphedema. There is skin changes widely Change the primary dressing to Sorbact to all wound areas. She needs compression on the left leg as well as the right 6/21; right mid tibia which was her one remaining wound at 1 point is unchanged The area on the right lateral malleolus actually is close to closing over still a small punched out area The area on the left lateral malleolus again a deep wound it is this 1 that she feels pain in. Pressure ulcer on the right heel was cultured today. New area on the left anterior mid tibia several geographic wounds last week in the setting of chronic venous insufficiency this actually looks some better 7/6; Right mid tibia about the same. Exposed muscle Left lateral malleolus again necrotic debris over the surface. Pressure ulcer on the right heel. She finished the Keflex we prescribed. Necrotic debris debrided with a #3 curette The rest of her wounds on the right mid tibia right lateral ankle are better Her husband reminds me that she had a right femoral endarterectomy and has 2 stents in her left thigh placed in 2011 approximately by vascular surgery in [place]. She saw Dr. [name] last in February 2020. At that point he did not think that the arterial insufficiency she had on the right was sufficient to explain any of her right lower extremity wounds however she now has an area on the left lateral malleolus that looks like an ischemic wound 7/12; we have been using Sorbact all wound areas Right mid tibia about the same exposed muscle Left lateral malleolus small wound with debris  in the surface punched out Pressure ulcer of the right heel requiring debridement of necrotic debris Lateral ankle wound on the right is just about closed Right mid tibia wound about the same still with exposed muscle. 7/26 really no change in any wounds. We have been using Sorbact. She has bilateral lower extremity wounds. This is in the setting of scleroderma,  severe venous hypertension. She also has known PAD and I had really hoped to be able to get a review by Dr. [name] but apparently that is not booked until August 31. I have asked her to call back to vein and vascular and get an appointment with somebody a little earlier if that is possible. 8/16; patient's area on the right ankle which was a chronic wound is closed over again. The area on the right mid lower leg, right heel left lateral ankle are all about the same. Pressure ulcer on the right heel is still not viable. New areas identified on the right lateral calf. She has a follow-up with Dr. [name] on 8/31. Phenomenally I would like him to go over her arterial status with regards to her underlying stents. 9/13; patient had her ARTERIAL studies done however apparently Dr. [name] is now in [place] so she did not get an appointment with him on 8/31 and was not referred to any other provider. On the right side her PTA was monophasic. Noncompressible. TBI on the right at 0.41 on the left she was monophasic and noncompressible they did not do a TBI because she had a Band-Aid on her big toe. Patient does not describe claudication although she is having a lot of pain in the left lateral malleolus. 10/11; the patient had a right-sided interventions on 10/5. She had a angioplasty of the right anterior tibial artery as well as the tibioperoneal trunk/peroneal artery she had a drug-coated balloon angioplasty of the right superficial femoral artery. On the left she did not have any interventions yet but is going to have another look at this in 2 weeks. The superficial femoral artery on the left and the associated stent are patent popliteal artery is patent to the dominant vessel runoff is the anterior tibial which has several high-grade stenosis in the proximal one third. We have been using Sorbact. She also has a new area on the right upper mid tibia. 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 She tolerated this well. She is using Iodoflex and her husband is doing a kerlix Coban on her. She arrives in clinic today with the original medial part of her mid tibia wound with muscle exposure small area on the right ankle which was part of her original wound she has 2 punched-out areas laterally and 1 above her area anteriorly. Nonviable surfaces similarly the right heel is nonviable. On the left side she has an area on the left foot and the left lateral ankle similar nonviable surfaces. 11/8; patient arrives in clinic today with nothing improved. She has a multiple lightly group of wounds on the right lower leg presumably different etiologies including a skin biopsy, the original wound she had medially and some punched-out areas that are not of clear defined etiology. She says she was in a lot of pain this week could not have her leg up  on the bed did not get completely relief from dropping it over the bed. She also has a small area on the left lateral malleolus and the left lateral foot necrotic surfaces on all of these wounds. We have been using Iodoflex I attempted to put her in 3 layer compression last week that did not go well. We will be backing off on kerlix Coban this week. 11/15; the ultrasound that I sent the patient for last week showed findings consistent with acute deep vein thrombosis involving the right distal femoral vein and right popliteal vein. I was concerned about the combination of Plavix and Xarelto however apparently Dr. [name] felt that was not a problem. She is now going through the acute dosing of Xarelto. She has an appointment with Dr. Trula Slade on 12/5. 11/29; 10 days ago before he left for weeks vacation she called to report pain in  the right leg I sent her in some doxycycline her husband thought things were getting better however she required admission to hospital from 11/22 through 11/24 prompted by finding that her hemoglobin was 5.7 she was transfused 2 units of packed cells and her hemoglobin stabilized. She did undergo an endoscopy with findings of significant esophagitis part of which was candidal and part of it reflux. She was discharged on nystatin swish and swallow which she is doing. She seems to be better from that regard. There was no active other active source of bleeding Unfortunately the combination of Xarelto and Plavix was felt to be too much for her. The Xarelto was stopped and an IVC filter was placed she is continued on Plavix. Her big complaint today is pain in the right calf really from the mid aspect superiorly. This is very tender slightly red but not particularly warm. She did see vein and vascular in the hospital I will see if I can pull their note 12/6; patient was at Dr. Stephens Shire office today. Great toe pressure on the right is 54 on the left 49 she has a 50 to 74% stenosis of the right mid popliteal on the left at 30 to 49% proximal SFA and a 50 to 74% CFA. She was treated with angioplasty of the right ATA, right TP trunk and peroneal artery as well as an angioplasty of the right SFA to popliteal artery. On 10/26 she underwent angioplasty of the left ATA and left TP trunk she has a history of a left common femoral endarterectomy and left superficial femoral artery stent as well as a right superficial femoral artery stent. In addition to her arterial disease she has significant venous disease with a history of a right greater saphenous vein ablation and a recent DVT in the right femoral and popliteal vein with insertion of an IVC filter. She has remained on Plavix but her anticoagulant was stopped. She continues to have a lot of swelling and pain in the right leg with significant erythema which I am  assuming is stasis dermatitis. She has had substantial wound areas on the right leg including right lateral a large necrotic wound with a smaller biopsy site above this. She has original wound on the left mid tibia with a small area above this as well. On the left she has a small area over the left lateral malleolus and the left lateral foot Electronic Signature(s) Signed: 12/06/2020 9:01:39 AM By: Linton Ham MD Entered By: Linton Ham on 12/05/2020 15:35:43 -------------------------------------------------------------------------------- Physical Exam Details Patient Name: Date of Service: [name] [name] CK, JO Durward Parcel F. 12/05/2020  1:45 PM Medical Record Number: [id number] Patient Account Number: [id number] Date of Birth/Sex: Treating RN: March 12, 1951 (69 y.o. [name] Primary Care Provider: [name] Other Clinician: Referring Provider: Treating Provider/Extender: [name] in Treatment: 270 Constitutional Patient is hypertensive.. Pulse regular and within target range for patient.Marland Kitchen Respirations regular, non-labored and within target range.. Temperature is normal and within the target range for the patient.Marland Kitchen Appears in no distress. Notes Wound exam Midportion of her right tibia and tibial tuberosity is less tense but still very painful. Large necrotic wound on the right posterior calf small satellite lesion just above it.. Right lateral malleolus does not look bad. Left lateral malleolus debrided with a #3 curette also the left lateral foot I am unable to get the viable tissue Electronic Signature(s) Signed: 12/06/2020 9:01:39 AM By: Linton Ham MD Entered By: Linton Ham on 12/05/2020 15:36:58 -------------------------------------------------------------------------------- Physician Orders Details Patient Name: Date of Service: [name] [name] CK, [name] 12/05/2020 1:45 PM Medical Record Number: [id number] Patient Account Number: [id number] Date of  Birth/Sex: Treating RN: 05/20/1951 (69 y.o. [name] Primary Care Provider: [name] Other Clinician: Referring Provider: Treating Provider/Extender: [name] in Treatment: [phone] Verbal / Phone Orders: No Diagnosis Coding Follow-up Appointments Return Appointment in 2 weeks. Bathing/ Shower/ Hygiene May shower and wash wound with soap and water. - on days that dressing is changed Edema Control - Lymphedema / SCD / Other Elevate legs to the level of the heart or above for 30 minutes daily and/or when sitting, a frequency of: - throughout the day Avoid standing for long periods of time. Wound Treatment Wound #16 - Calcaneus Wound Laterality: Right Cleanser: Soap and Water Every Other Day/15 Days Discharge Instructions: May shower and wash wound with dial antibacterial soap and water prior to dressing change. Peri-Wound Care: Sween Lotion (Moisturizing lotion) Every Other Day/15 Days Discharge Instructions: Apply moisturizing lotion as directed Prim Dressing: Santyl Ointment Every Other Day/15 Days ary Discharge Instructions: Apply to wound bed as instructed Secondary Dressing: Woven Gauze Sponge, Non-Sterile 4x4 in Every Other Day/15 Days Discharge Instructions: Apply over primary dressing as directed. Secondary Dressing: ABD Pad, 8x10 Every Other Day/15 Days Discharge Instructions: Apply over primary dressing as directed. Compression Wrap: Kerlix Roll 4.5x3.1 (in/yd) Every Other Day/15 Days Discharge Instructions: Apply Kerlix and Coban compression as directed. Compression Wrap: Coban Self-Adherent Wrap 4x5 (in/yd) Every Other Day/15 Days Discharge Instructions: Apply over Kerlix as directed. Wound #19 - Malleolus Wound Laterality: Right, Lateral Cleanser: Soap and Water Every Other Day/15 Days Discharge Instructions: May shower and wash wound with dial antibacterial soap and water prior to dressing change. Peri-Wound Care: Sween Lotion  (Moisturizing lotion) Every Other Day/15 Days Discharge Instructions: Apply moisturizing lotion as directed Prim Dressing: Santyl Ointment Every Other Day/15 Days ary Discharge Instructions: Apply to wound bed as instructed Secondary Dressing: Woven Gauze Sponge, Non-Sterile 4x4 in Every Other Day/15 Days Discharge Instructions: Apply over primary dressing as directed. Secondary Dressing: ABD Pad, 8x10 Every Other Day/15 Days Discharge Instructions: Apply over primary dressing as directed. Compression Wrap: Kerlix Roll 4.5x3.1 (in/yd) Every Other Day/15 Days Discharge Instructions: Apply Kerlix and Coban compression as directed. Compression Wrap: Coban Self-Adherent Wrap 4x5 (in/yd) Every Other Day/15 Days Discharge Instructions: Apply over Kerlix as directed. Wound #20 - Malleolus Wound Laterality: Left, Lateral Cleanser: Soap and Water Every Other Day/15 Days Discharge Instructions: May shower and wash wound with dial antibacterial soap and water prior to dressing change. Peri-Wound Care:  Sween Lotion (Moisturizing lotion) Every Other Day/15 Days Discharge Instructions: Apply moisturizing lotion as directed Prim Dressing: Santyl Ointment Every Other Day/15 Days ary Discharge Instructions: Apply to wound bed as instructed Secondary Dressing: Woven Gauze Sponge, Non-Sterile 4x4 in Every Other Day/15 Days Discharge Instructions: Apply over primary dressing as directed. Secondary Dressing: ABD Pad, 8x10 Every Other Day/15 Days Discharge Instructions: Apply over primary dressing as directed. Compression Wrap: Kerlix Roll 4.5x3.1 (in/yd) (DME) (Generic) Every Other Day/15 Days Discharge Instructions: Apply Kerlix and Coban compression as directed. Compression Wrap: Coban Self-Adherent Wrap 4x5 (in/yd) (DME) (Generic) Every Other Day/15 Days Discharge Instructions: Apply over Kerlix as directed. Wound #23 - Lower Leg Wound Laterality: Right, Posterior Cleanser: Soap and Water Every Other  Day/15 Days Discharge Instructions: May shower and wash wound with dial antibacterial soap and water prior to dressing change. Peri-Wound Care: Sween Lotion (Moisturizing lotion) Every Other Day/15 Days Discharge Instructions: Apply moisturizing lotion as directed Prim Dressing: Santyl Ointment Every Other Day/15 Days ary Discharge Instructions: Apply to wound bed as instructed Secondary Dressing: Woven Gauze Sponge, Non-Sterile 4x4 in Every Other Day/15 Days Discharge Instructions: Apply over primary dressing as directed. Secondary Dressing: ABD Pad, 8x10 Every Other Day/15 Days Discharge Instructions: Apply over primary dressing as directed. Compression Wrap: Kerlix Roll 4.5x3.1 (in/yd) (DME) (Generic) Every Other Day/15 Days Discharge Instructions: Apply Kerlix and Coban compression as directed. Compression Wrap: Coban Self-Adherent Wrap 4x5 (in/yd) (DME) (Generic) Every Other Day/15 Days Discharge Instructions: Apply over Kerlix as directed. Wound #24 - Lower Leg Wound Laterality: Right, Anterior Cleanser: Soap and Water Every Other Day/15 Days Discharge Instructions: May shower and wash wound with dial antibacterial soap and water prior to dressing change. Peri-Wound Care: Sween Lotion (Moisturizing lotion) Every Other Day/15 Days Discharge Instructions: Apply moisturizing lotion as directed Prim Dressing: Santyl Ointment Every Other Day/15 Days ary Discharge Instructions: Apply to wound bed as instructed Secondary Dressing: Woven Gauze Sponge, Non-Sterile 4x4 in Every Other Day/15 Days Discharge Instructions: Apply over primary dressing as directed. Secondary Dressing: ABD Pad, 8x10 Every Other Day/15 Days Discharge Instructions: Apply over primary dressing as directed. Compression Wrap: Kerlix Roll 4.5x3.1 (in/yd) Every Other Day/15 Days Discharge Instructions: Apply Kerlix and Coban compression as directed. Compression Wrap: Coban Self-Adherent Wrap 4x5 (in/yd) Every Other Day/15  Days Discharge Instructions: Apply over Kerlix as directed. Wound #25 - Foot Wound Laterality: Left, Lateral Cleanser: Soap and Water Every Other Day/15 Days Discharge Instructions: May shower and wash wound with dial antibacterial soap and water prior to dressing change. Peri-Wound Care: Sween Lotion (Moisturizing lotion) Every Other Day/15 Days Discharge Instructions: Apply moisturizing lotion as directed Prim Dressing: Santyl Ointment Every Other Day/15 Days ary Discharge Instructions: Apply to wound bed as instructed Secondary Dressing: Woven Gauze Sponge, Non-Sterile 4x4 in Every Other Day/15 Days Discharge Instructions: Apply over primary dressing as directed. Secondary Dressing: ABD Pad, 8x10 Every Other Day/15 Days Discharge Instructions: Apply over primary dressing as directed. Compression Wrap: Kerlix Roll 4.5x3.1 (in/yd) Every Other Day/15 Days Discharge Instructions: Apply Kerlix and Coban compression as directed. Compression Wrap: Coban Self-Adherent Wrap 4x5 (in/yd) Every Other Day/15 Days Discharge Instructions: Apply over Kerlix as directed. Wound #26 - Lower Leg Wound Laterality: Right, Lateral Cleanser: Soap and Water Every Other Day/15 Days Discharge Instructions: May shower and wash wound with dial antibacterial soap and water prior to dressing change. Peri-Wound Care: Sween Lotion (Moisturizing lotion) Every Other Day/15 Days Discharge Instructions: Apply moisturizing lotion as directed Prim Dressing: Santyl Ointment Every Other Day/15 Days  ary Discharge Instructions: Apply to wound bed as instructed Secondary Dressing: Woven Gauze Sponge, Non-Sterile 4x4 in Every Other Day/15 Days Discharge Instructions: Apply over primary dressing as directed. Secondary Dressing: ABD Pad, 8x10 Every Other Day/15 Days Discharge Instructions: Apply over primary dressing as directed. Compression Wrap: Kerlix Roll 4.5x3.1 (in/yd) Every Other Day/15 Days Discharge Instructions: Apply  Kerlix and Coban compression as directed. Compression Wrap: Coban Self-Adherent Wrap 4x5 (in/yd) Every Other Day/15 Days Discharge Instructions: Apply over Kerlix as directed. Wound #27 - Lower Leg Wound Laterality: Right, Anterior, Distal Cleanser: Soap and Water Every Other Day/15 Days Discharge Instructions: May shower and wash wound with dial antibacterial soap and water prior to dressing change. Peri-Wound Care: Sween Lotion (Moisturizing lotion) Every Other Day/15 Days Discharge Instructions: Apply moisturizing lotion as directed Prim Dressing: Santyl Ointment Every Other Day/15 Days ary Discharge Instructions: Apply to wound bed as instructed Secondary Dressing: Woven Gauze Sponge, Non-Sterile 4x4 in Every Other Day/15 Days Discharge Instructions: Apply over primary dressing as directed. Secondary Dressing: ABD Pad, 8x10 Every Other Day/15 Days Discharge Instructions: Apply over primary dressing as directed. Compression Wrap: Kerlix Roll 4.5x3.1 (in/yd) Every Other Day/15 Days Discharge Instructions: Apply Kerlix and Coban compression as directed. Compression Wrap: Coban Self-Adherent Wrap 4x5 (in/yd) Every Other Day/15 Days Discharge Instructions: Apply over Kerlix as directed. Wound #5 - Lower Leg Wound Laterality: Right, Medial Cleanser: Soap and Water Every Other Day/15 Days Discharge Instructions: May shower and wash wound with dial antibacterial soap and water prior to dressing change. Peri-Wound Care: Sween Lotion (Moisturizing lotion) Every Other Day/15 Days Discharge Instructions: Apply moisturizing lotion as directed Prim Dressing: Santyl Ointment Every Other Day/15 Days ary Discharge Instructions: Apply to wound bed as instructed Secondary Dressing: Woven Gauze Sponge, Non-Sterile 4x4 in Every Other Day/15 Days Discharge Instructions: Apply over primary dressing as directed. Secondary Dressing: ABD Pad, 8x10 Every Other Day/15 Days Discharge Instructions: Apply over  primary dressing as directed. Compression Wrap: Kerlix Roll 4.5x3.1 (in/yd) Every Other Day/15 Days Discharge Instructions: Apply Kerlix and Coban compression as directed. Compression Wrap: Coban Self-Adherent Wrap 4x5 (in/yd) Every Other Day/15 Days Discharge Instructions: Apply over Kerlix as directed. Electronic Signature(s) Signed: 12/05/2020 5:36:08 PM By: Levan Hurst RN, BSN Signed: 12/06/2020 9:01:39 AM By: Linton Ham MD Entered By: Levan Hurst on 12/05/2020 16:49:51 -------------------------------------------------------------------------------- Problem List Details Patient Name: Date of Service: [name] [name] CK, [name] 12/05/2020 1:45 PM Medical Record Number: [id number] Patient Account Number: [id number] Date of Birth/Sex: Treating RN: [birth year] (69 y.o. 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 02/24/2015 No Yes I87.331 Chronic venous hypertension (idiopathic) with ulcer and inflammation of right 10/04/2016 No Yes lower extremity L89.616 Pressure-induced deep tissue damage of right heel 12/14/2019 No Yes L97.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 05/31/2020 No Yes L97.528 Non-pressure chronic ulcer of other part of left foot with other specified 12/05/2020 No Yes severity I70.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 10/31/2020 No Yes lower leg I70.248 Atherosclerosis of native arteries of left leg  with  ulceration of other part of 10/31/2020 No Yes lower leg Inactive Problems ICD-10 Code Description Active Date Inactive Date 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 12/05/2020 1:45 PM Medical Record Number: [id number] Patient Account Number: [id number] Date of Birth/Sex: Treating RN: 09/07/1951 (69 y.o. [name] Primary Care Provider: [name] Other Clinician: Referring Provider: Treating Provider/Extender: [name] in Treatment: 454 Subjective History of Present Illness (HPI) this is a patient who initially came to [place] for wounds on the medial malleoli bilaterally as well as her upper medial lower extremities bilaterally. These wounds eventually healed with assistance of Apligraf's bilaterally. While this was occurring she developed the current wound which opened into a fairly substantial wound on the right lower extremity. These are mostly secondary to venous stasis physiology however the patient also has underlying scleroderma, pulmonary hypertension. The wound has been making good progress lately with the Hydrofera Blue-based dressing. 03/17/2015; patient had a arterial evaluation a year ago. Her right ABI was 0.86 left was 1.0. T brachial index was 0.41 on the right 0.45 on the left. Her oe bilateral common femoral artery waveforms were triphasic. Her white popliteal  posterior tibial artery and anterior tibial artery waveforms were monophasic with good amplitude. Luteal artery waveforms were biphasic it was felt that her bilateral great toe pressures are of normal although adequate for tissue healing. 03/24/2015. The condition of this wound is not really improved that. He was covered with as fibrinous surface slough and eschar. This underwent an aggressive debridement with both a curette and scalpel. I still cannot really get down to what I can would consider to be a viable surface. There is no evidence of infection. Previous workup for ischemia roughly a year ago was negative nevertheless I think that continues to be a concern 04/07/15. The patient arrived for application of second Apligraf. Once again the surface of this wound is certainly less viable than I would like for an advanced treatment option. An aggressive debridement was done. She developed some arteriolar bleeding which required that along pressure and silver alginate. 04/21/15: change in the condition of this wound. Once again it is covered in a gelatinous surface slough. After debridement today surface of the wound looks somewhat better but now a heeling surface 05/05/15 Apligraf #4 applied.Still a lot of slough on this wound. 05/19/15 Apligraf #5 applied. Still a lot of slough on this wound I did not aggressively remove this 06/02/15; continued copious amounts of surface slough. This debridement fairly easily. 06/08/2015 -- the last time she had a venous duplex study done was over 3 years ago and the surgery was prior to that. I have recommended that she sees Dr. early for a another opinion regarding a repeat venous duplex and possibly more endovenous ablation of vein stripping of micro-phlebotomies. 06/16/15; wound has a gelatinous surface eschar that the debridement fairly easily to a point. I don't disagree with the venous workup and perhaps even arterial re-evaluation. She is on prednisone 5 mg and  continue his medications for her pulmonary artery hypertension I am not sure if  the latter have any wound care healing issues I would need to investigate this. 06/23/15 continues with a gelatinous surface eschar with of fibrinous underlying. What I can see of this wound does not look unhealthy however I just can't get this material which I think is 2 different layers off. Empiric culture done 06/30/15; unfortunately not a lot of change in this wound. A gelatinous surface eschar is easily removed however it has a tight fibrinous surface underneath the. 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 The wound comes back and basically and unchanged state the. She has a gelatinous surface that is more easily removed however there is a tightly fibrinous surface underneath the. There is no evidence of infection. She has a vascular follow-up next month. I would have to inject her in order to do a more aggressive debridement of this area 07/21/15 the wound is roughly in the same state albeit the debridement is done with greater ease. There is less of the fibrinous eschar underneath the. There is no evidence of infection. She has follow-up with vascular surgery next week. No evidence of surrounding infection. Her original distal wounds healed while this one formed. 07/28/15; wound is easier to debride. No wound erythema. She is seeing Dr. [name] next week. 08/18/15 Has been to [place] for repeat ablation. Have been using medihoney pad with some improvement 08/25/15; absolutely no change in the condition of this wound in either its overall size or surface condition in many months now. At one point I had this down to [place] healthier surface I think with [facility] however this did not actually progress towards closure. Do not believe that the wound has actually deteriorated in terms of volume at all. We have been using a medihoney pad which allows easier debridement of the gelatinous eschar but  again no overall actual improvement. the patient is going towards an ablation with pain and vascular which I think is scheduled for next week. The only other investigation that I could first see would be a biopsy. She does have underlying scleroderma 09/02/15 eschar is much easier to debridement however the base of this does not look particularly vibrant. We changed to Iodoflex. The patient had her ablation earlier this week 09/09/15 again the debridement over the base of this wound is easier and the base of the wound looks considerably better. We will continue the Iodoflex. Dr. Tawni Millers has expressed his satisfaction with the result of her ablation 09/15/15 once again the wound is relatively free of surface eschar. No debridement was done today. It has a pale-looking base to it. although this is not as deep as it once was it seems to be expanding especially inferiorly. She has had recent venous ablation but this is no closer to healing.I've gone ahead and done a punch biopsy this from the inferior part of the wound close to normal skin 09/22/15: the wound is relatively free of surface eschar. There is some surrounding eschar. I'm not exactly sure at what level the surface is that I am seeing. 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 No evidence of atypical infection, malignancy. 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 No debridement was done 09/29/15; the wound surface is relatively free of surface eschar. There is eschar surrounding the walls of the wound. Aside from the improvement in the amount of surface slough. This wound has not progressed any towards closure. There is not even a surface that looks like  there at this is ready. There is no evidence of any infection nor maligancy based on biopsy I did on 9/15. I continued with the Iodoflex however I am looking towards some alternative to try to promote some closure or filling in of this surface.  Consider triple layer Oasis. Collagen did not result in adequate control of the surface slough 10/13/15; the patient was in hospital last week with severe anemia. The wound looks somewhat better after debridement although there is widening medially. There is no evidence of infection. 10/20/15; patient's wound on the right lateral lower leg is essentially unchanged. This underwent a light surface debridement and in general the debridement is easier and the surface looks improved. I noted in doing this on the side of the wound what appears to be a piece of calcium deposition. The patient noted that she had previous things on the tips of her fingers. 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 She follows with rheumatology and I have asked them to talk to her about this. In view of that S the nonhealing ulcer may have something to do with calcinosis and also unrelieved Raynaud's disease in this area. I should note that her original wounds on the right and left medial malleolus and the inner aspects of both legs just below the knees did however heal over 10/30/15; the patient's wound on the right lateral lower leg is essentially unchanged. I was able to remove some calcified material from the medial wound edge. 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 Otherwise the wound appears essentially unchanged there is less adherent surface eschar. Some of the calcified material was sent to pathology for analysis 11/17/15. The patient's wound on the right lateral leg is essentially unchanged. Wider Medially. 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 There is some  surface slough but I did not debridement this. No evidence of surrounding infection. 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 This will make healing difficult 12/15/15; the wound has less depth surface has a fibrinous slough and calcifications in the wound edge no evidence of infection 12/22/15; the wound definitely has less Fibrinous slough on the base. Calcifications around the wound edges are still evident. Although the wound bed looks healthier it is still pale in appearance. Previous biopsy did not show malignancy 01/04/15; surgical debridement of nonviable slough and subcutaneous tissue the wound cleans up quite nicely but appears to be expanding outward calcifications around the wound edges are still there. 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 She is to see her rheumatologist I'll see if he has any opinions on this. My punch biopsy done in September did not show calcifications although these are clearly evident. 01/19/16 light selective debridement of nonviable surface slough. There is epithelialization medially. This gives me reason for cautious optimism. She has been to see her rheumatologist, there is nothing that can be done for this type of soft tissue calcification associated with scleroderma 02/02/16 no debridement although there is a light surface slough. She has 2 peninsulas of skin 1 inferiorly and one medially. We continue to make a slight and slow but definite progressive here  02/16/16; light surface debridement with more attention to the circumference of the wound bed where the fibrinous eschar is more prevalent. No calcifications detected. She seems to have done nicely with the [facility] with some epithelialization and some improvement in the overall wound volume. 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 Continued attention to the circumference of the wound where the fibrinous eschar in calcinosis or prevalent. No calcifications were detected. She has continued improvement with Hydrofera Blue. The wound is no longer as deep 03/15/16 surgical debridement done to remove surface escha Especially around the circumference of the wound where there is nonviable subcutaneous tissue. In spite of this there is considerable improvement in the overall dimensions and depth of the wound. Islands of epithelialization are seen especially medially inferiorly and superiorly to a lesser extent. She is using Hydrofera Blue at home 03/29/16; surgical debridement done to remove surface eschar and nonviable subcutaneous tissue. This cleans up quite nicely mention slightly larger in terms of length and width however depth is less 04/12/16; continued gradual improvement in terms of depth and the condition of the wound base. Debridement is done. Continuing long standing Hydrofera Blue at home with Kerlix Coban wraps 04/26/16; continued gradual improvement in terms of depth and management as well as condition of the wound base. Surgical debridement done she continues with Hydrofera Blue. 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 She initially came to this clinic venous insufficiency ulcers which have long since healed 05/17/16 continued improvement in terms of the depth and measurements of this wound although she has a tightly adherent fibrinous slough each time. We've been continued with long standing Hydrofera Blue which seems to done as well for this wound is many advanced treatment options. 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 She also has chronic venous insufficiency. She has an irritation on her lateral right ankle secondary to our wraps 05/10/16; wound appears to be smaller especially on  the medial aspect and especially in the width. Wound was debridement surface looks better. She is also been in the hospital apparently with anemia again she tells me she had an endoscopy. Since she got home after 3 days which I believe was sometime last week she has had an irritated painful area on the right lateral ankle surrounding the lateral malleolus 05/31/16; much more adherent surface slough today then recently although I don't think the dimensions of changed that much. A more aggressive debridement is required. The irritated area over her medial malleolus is more pruritic and painful and I don't think represents cellulitis 06/14/16; no major change in her wound dimensions however there is more tightly adherent surface slough which is disappointing. As well as she appears to have a new small area medially. Furthermore an irritated uncomfortable area on the lateral aspect of her right foot just below the lateral malleolus. 06/21/16; I'm seeing the patient back in follow-up for the new areas under her major wound on the right anterior leg. She has been using Hydrofera Blue to this area probably for several months now and although the dressings seem to be helping for quite a period of time I think things have stagnated lately. She comes in today with a relatively tight adherent surface slough and really no changes in the wound shape or dimension. The 2 small areas she had inferiorly are  tiny but still open they seem improved this well. There is no uncontrolled edema and I don't think there is any evidence of cellulitis. 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 Patient has had vascular evaluations both venous and arterial. I have biopsied this area. There is no obvious infection. The worrisome thing today is that she seems to be developing areas of erythema and epithelial damage on the medial  aspect of the right foot. Also to a lesser degree inferior to the actual wound itself. Again I see no obvious changes to suggest cellulitis however as this is the only treatable option I will probably give her antibiotics. 07/13/16 no major change in the lady's large anterior right leg wound. Still covered with a very tightly adherent surface slough which is difficult to debridement. There is less erythema around this, culture last week grew pseudomonas I gave her ciprofloxacin. The area on her lateral right malleolus looks better- 08/02/16 the patient's wounds continued to decline. Her original large anterior right leg wound looks deeper. Still adherent surface slough that is difficult to debridement. She has a small area just below this and a punched-out wound just below her lateral malleolus. In the meantime she is been in hospital with apparently an upper GI bleed on Plavix and aspirin. She is now just on Plavix she received 3 units of packed cells 08/23/16; since I last saw this 3 weeks ago, the open large area on her right leg looks about the same syrup. She has a small satellite lesion just underneath this. The area on her medial right ankle is now a deep necrotic wound. I attempted to debridement this however there is just too much pain. 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 She follows with vascular surgery and is scheduled for an angiogram in early September 09/06/16; the patient is going for an arteriogram tomorrow. Her original large wound on the right calf is about the same the satellite lesion underneath it is about the same however the area on her medial ankle is now deeper with exposed tendon. I am no longer attempting to debride these wounds 09/20/16; the patient has undergone a right femoral endarterectomy and Dacron patch angioplasty. This seems to have helped the flow in her right leg. 10/04/16; Arrived today for  aggressive debridement of the wounds on her right calf the original wound the one beneath it and a difficult area over her right lateral leg just above the lateral malleolus. 10/25/16; her 3 open wounds are about the same in terms of dimensions however the surface appears a lot healthier post debridement. Using Iodoflex 10/18/16 we have been using Iodoflex to her wounds which she tolerated with some difficulty. 10/11/16; has been using Santyl for a period of time with some improvement although again very adherent surface slough would prevent any attempted healing this. She has a original wound on the left calf, the satellite underneath that and the most recent wound on the right medial ankle. She has completed revascularization by Dr. early and has had venous ablation earlier. Want to go back to Iodoflex to see if week and get a healthier surface to this wound bed failing this I think she'll need to be taken to the OR and I am prepared to call Dr. Marla Roe to discuss this. She is obviously not a  good candidate for general anesthesia however.; 11/08/16; I put her on Iodoflex last time to see if I can get the wound bed any healthier and unfortunately today still had tremendous surface slough. 11/15/16; 4 weeks' worth of Iodoflex with not much improvement. Debridement on the major wounds on her left anterior leg is easier however this does not maintain from week to week. The punched-out area on her medial right ankle 11/29/16; I attempted to change the patient last visit to [facility] however she states this burned and was very uncomfortable therefore we gave her permission to go back to the eye out of complex which she already had at home. Also she noted a lot of pain and swelling on the lateral aspect of her leg before she traveled to [facility] for the holidays, I called her in doxycycline over the phone. This seems to have helped 12/06/16; Wounds unchanged by in large. Using  Iodoflex 12/13/16; her wounds today actually looks somewhat better. The area on the right lateral lower leg has reasonably healthy-looking granulation and perhaps as actually filled and a bit. Debridement of the 2 wounds on the medial calf is easier and post debridement appears to have a healthier base. We have referred her to Dr. Migdalia Dk for consideration of operative debridement 12/20/16; we have a quick note from Dr. Merri Ray who feels that the patient needs to be referred to an academic center/plastic surgeon. This is due to the complexity of the patient's medical issues as it applies to anything in the OR. We have been using Iodoflex 12/27/16; in general the wounds on the right leg are better in terms of the difficult to remove surface slough. She has been using Iodoflex. She is approved for Apligraf which I anticipated ordering in the next week or 2 when we get a better-looking surface 01/04/16 the deep wounds on the right leg generally look better. Both of them are debrided further surface slough. The area on the lateral right leg was not debrided. She is approved for Apligraf I think I'll probably order this either next week or the week after depending on the surface of the wounds superiorly. We have been using Iodoflex which will continue until then 01/11/16; the deep wounds on the right leg again have a surface slough that requires debridement. I've not been able to get the wound bed on either one of these wounds down to what would be acceptable for an advanced skin stab-like Apligraf. The area on the medial leg has been improving. We have been using hot Iodoflex to all wounds which seems to do the best at at least limiting the nonviable surface 01/24/17; we have continued Iodoflex and all her wound areas. Her debridement Gen. he is easier and the surface underneath this looks viable. Nevertheless these are large area wounds with exposed muscle at least on the anterior parts. We have  ordered Apligraf's for 2 weeks from now. The patient will be away next week 02/07/17; the patient was close to have first Apligraf today however we did not order one. I therefore replaced her Iodoflex. She essentially has 3 large punched- out areas on her right anterior leg and right medial ankle. 02/11/17; Apligraf #1 02/25/17; Apligraf #2. In general some improvement in the right medial ankle and right anterior leg wounds. The larger wound medially perhaps some better 03/11/17; Apligraf #3. In general continued improvement in the right medial ankle and the right anterior leg wounds. 03/25/17 Apligraf #4. In general continued improvement especially on  the right medial ankle and the lower 04/08/17; Apligraf #5 in general continued improvement in all wound sites. 04/22/17; post Apligraf #5 her wound beds continued to look a lot better all of them up to the surface of the surrounding skin. Had a caramel-colored slough that I did not debrided in case there is residual Apligraf effect. The wounds are as good as I have seen these looking quite some time. 04/29/17; we applied [place] and last week after completing her Apligraf. Wounds look as though they've contracted somewhat although they have a nonviable surface which was problematic in the past. Apparently she has been approved for further Apligraf's. We applied Iodosorb today after debridement. 05/06/17; we're fortunate enough today to be able to apply additional Apligraf approved by her insurance. In general all of her wounds look better Apligraf #6 05/24/17; Apligraf #7 continued improvement in all wounds o3 06/10/17 Apligraf #8. Continued improvement in the surface of all wounds. Not much of an improvement in dimensions as I might a follow 06/24/17 Apligraf #9 continued general improvement although not as much change in the wound areas I might of like. She has a new open area on the right anterior lateral ankle very small and superficial. She also has a  necrotic wound on the tip of her right index finger probably secondary to severe uncontrolled Raynaud's phenomenon. She is already on sildenafil and already seen her rheumatologist who gave her Keflex. 07/08/17; Apligraf #10. Generalized improvement although she has a small additional wound just medial to the major wound area. 07/22/17; after discussion we decided not to reorder any further Apligraf's although there is been considerable improvement with these it hasn't been recently. The major wound anteriorly looks better. Smaller wounds beneath this and the more recent one and laterally look about the same. The area on the right lateral lower leg looks about the same 07/29/17 this is a patient who is exceedingly complex. She has advanced scleroderma, crest syndrome including calcinosis, PAD status post revascularization, chronic venous insufficiency status post ablations. She initially presented to this clinic with wounds on her bilateral lower legs however these closed. More recently we have been dealing with a large open area superiorly on the right anterior leg, a smaller wound underneath this and then another one on the right medial lower extremity. These improve significantly with 10 Apligraf applications. Over the last 2-3 weeks we are making good progress with Hydrofera Blue and these seem to be making progress towards closure 08/19/17; wounds continued to make good improvement with Hydrofera Blue and episodic aggressive debridements 08/26/17; still using Hydrofera Blue. Good improvements 09/09/17; using Hydrofera Blue continued improvement. Area on the lateral part of her right leg has only a small remaining open area. The small inferior satellite region is for all intents and purposes closed. Her major wound also is come in in terms of depth and has advancing epithelialization. 09/16/17; using Hydrofera Blue with continued improvement. The smaller satellite wound. We've closed out today along with  a new were satellite wound medially. The area on her medial ankle is still open and her major wound is still open but making improvement. All using Hydrofera Blue. Currently 09/30/17; using Hydrofera Blue. Still a small open area on the lateral right ankle area and her original major wound seems to be making gradual and steady improvement. 10/14/17; still using Hydrofera Blue. Still too small open areas on the right lateral ankle. Her original major wound is horizontal and linear. The most problematic area paradoxically seems  to be the area to the medial area wears I thought it would be the lateral. 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 10/28/17; still using Hydrofera Blue. Right lateral ankle has a very small open area superiorly on the most lateral part of the wound. Her original open wound has 2 open areas now separated by normal skin and we've redefined this. 11/11/17; still using Hydrofera Blue area and right lateral ankle continues to have a small open area on mostly the lateral part of the wound. Her original wound has 2 small open areas now separated by a considerable amount of normal skin 11/28/17; the patient called in slightly before Thanksgiving to report pain and erythema above the wound on the right leg. In the past this is responded well to treatment for cellulitis and I gave her over the phone doxycycline. She stated this resulted in fairly abrupt improvement. We have been using Hydrofera Blue for a prolonged period of time to the larger wound anteriorly into the remaining wound on her right right lateral ankle. The latter is just about closed with only a small linear area and the bottom of the [place]. 12/02/17; use endoform and left the dressing on since last visit. There is no tenderness and no evidence of infection. 12/16/17; patient has been using Endoform but not making much progress. The 2 punched out open areas anteriorly  which were the reminiscence of her major wound appear deeper. The area on the lateral aspect of her right calf also appears deeper. Also she has a puzzling tender swelling above her wound on the right leg. This seems larger than last time. Just above her wounds there appears to be some fluctuance in this area it is not erythematous and there is no crepitus 12/30/17; patient has been using Endoform up until last week we used Hydrofera Blue. Ultrasound of the swelling above her 2 major wounds last time was negative for a fluid collection. I gave her cefaclor for the erythema and tenderness in this area which seems better. Unfortunately both punched out areas anteriorly and the area on her right medial lower leg appear deeper. In fact the lateral of the wounds anteriorly actually looks as though it has exposed tendon and/or muscle sheath. She is not systemically unwell. She is complaining of vaginitis type symptoms presumably Candida from her antibiotics. 01/06/18; we're using santyl. she has 2 punched out areas anteriorly which were initially part of a large wound. Unfortunately medially this is now open to tendon/muscle. All the wounds have the same adherent very difficult to debride surface. 01/20/18; 2 week follow-up using Santyl. She has the 2 punched out areas anteriorly which were initially part of her large surface wound there. Medially this still has exposed muscle. All of these have the same tightly adherent necrotic surface which requires debridement. PuraPly was not accepted by the patient's insurance however her insurance I think it changed therefore we are going to run Apligraf to gain 02/03/18; the patient has been using Santyl. The wound on the right lateral ankle looks improved and the 2 areas anteriorly on the right leg looks about the same. The medial one has exposed muscle. The lateral 1 requiresdebridement. We use PuraPly today for the first week 02/10/18; PuraPly #2. The patient has 3  wounds. The area on the right lateral ankle, 2 areas anteriorly that were part of her original large wound in this area the medial one has exposed muscle. All of the  wounds were lightly debrided with a number 3 curet. PuraPly #2 applied the lateral wound on the calf and the right lateral ankle look better. 02/17/18; PuraPly #3. Patient has 3 wounds. The area on the right lateral ankle in 2 areas internally that were part of her original large wound. The lateral area has exposed muscle. She arrived with some complaints of pain around the right ankle. 02/24/18; PuraPly #4; not much change in any of the 3 wound areas. Right lateral ankle, right lateral calf. Both of these required debridement with a #3 curet. She tolerates this marginally. The area on the medial leg still has exposed muscle. Not much change in dimensions 03/03/18 PuraPly #5. The area on the medial ankle actually looks better however the 2 separate areas that were original parts of the larger right anterior leg wound look as though they're attempting to coalesce. 03/10/18; PuraPly #6. The area on the medial ankle actually continues to look and measures smaller however the 2 separate areas that were part of the original large wound on the right anterior leg have now coalesced. There hasn't been much improvement here. The lateral area actually has underlying exposed muscle 03/17/18-she is here in follow-up evaluation for ulcerations to the right lower extremity. She is voicing no complaints or concerns. She tolerated debridement. Puraply#7 placed 03/24/18; difficult right lower extremity ulcerations. PuraPly #8 place. She is been approved for [employer]. She did very well with Apligraf today however she is apparently reached her "lifetime max" 03/31/18; marginal improvement with PuraPly although her wounds looked as good as they have in several weeks today. Used TheraSkins #1 04/14/18 TheraSkin #2 today 04/28/18 TheraSkins #3. Wound slightly  improved 05/12/18; TheraSkin skin #4. Wound response has been variable. 05/27/18 TheraSkin #5. Generally improvement in all wound areas. I've also put her in 3 layer compression to help with the severe venous hypertension 06/09/18; patient is done quite well with the TheraSkins unfortunately we have no further applications. I also put her in 3 layer compression last week and that really seems to of helped. 06/16/18; we have been using silver collagen. Wounds are smaller. Still be open area to the muscle layer of her calf however even that is contracted somewhat. She tells me that at night sometimes she has pain on the right lateral calf at the site of her lower wound. Notable that I put her into 3 layer compression about 3 weeks ago. She states that she dangles her leg over the bed that makes it feel better but she does not describe claudication during the day ooShe is going to call her secondary insurance to see if they will continue to cover advanced treatment products I have reviewed her arterial studies from 01/22/17; this showed an ABI in the right of 1 and on the left noncompressible. TBI on the right at 0.30 on the left at 0.34. It is therefore possible she has significant PAD with medial calcification falsely elevating her ABI into the normal range. I'll need to be careful about asking her about this next week it's possible the 3 layer compression is too much 06/23/18; was able to reapply TheraSkin 1 today. Edema control is good and she is not complaining of pain no claudication 07/07/18;no major change. New wound which was apparently a taper removal injury today in our clinic between her 2 wounds on the right calf TheraSkins #2 07/14/18; I think there is some improvement in the right lateral ankle and the medial part of her wound. There is still  exposed muscle medially. 07/28/18; two-week follow-up. TheraSkins#3. Unfortunately no major change. She is not a candidate I don't think for skin grafting  due to severe venous hypertension associated with her scleroderma and pulmonary hypertension 08/11/18 Patient is here today for her [place] application #06 (#5 of the second set). She seems to be doing well and in the base of the wound appears to show some progress at this point. This is the last approved [place] of the second set. 08/25/18; she has completed TheraSkin. There has been some improvement on the right lateral calf wound as well as the anterior leg wounds. The open area to muscle medially on the anterior leg wound is smaller. I'm going to transition her back to [facility] under Kerlix Coban change every second day. She reports that she had some calcification removed from her right upper arm. We have had previous problems with calcifications in her wounds on her legs but that has not happened recently 09/08/18;using Hydrofera Blue on both her wound areas. Wounds seem to of contracted nicely. She uses Kerlix Coban wrap and changes at home herself 09/22/2018; using Hydrofera Blue on both her wound areas. Dimensions seem to have come down somewhat. There is certainly less depth in the medial part of the mid tibia wound and I do not think there is any exposed muscle at this point. 10/06/2018; 2-week follow-up. Using [facility] on her wound areas. Dimensions have come down nicely both on the right lateral ankle area in the right mid tibial area. She has no new complaints 10/20/2018; 2-week follow-up. She is using Hydrofera Blue. Not much change from the last time she was here. The area on the lateral ankle has less depth although it has raised edges on one side. I attempted to remove as much of the raised edge as I could without creating more additional wounds. The area on the right anterior mid tibia area looks the same. 11/03/2018; 2-week follow-up she is using Hydrofera Blue. On the right anterior leg she now has 2 wounds separated by a large area of normal skin. The area on the medial  part still has I think exposed muscle although this area itself is a lot smaller. The area laterally has some depth. Both areas with necrotic debris. ooThe area on her right lateral ankle has come in nicely 11/17/2018; patient continues to use Hydrofera Blue. We have been increasing separation of the 2 wounds anteriorly which were at one point joint the area on the right lateral calf continues to have I think some improvement in depth. 12/08/2018; patient continues to use Hydrofera Blue. There is some improvement in the area on the right lateral calf. The 2 areas that were initially part of the original large wound in the mid right tibia are probably about the same. In fact the medial area is probably somewhat larger. We will run puraply through the patient's insurance 12/22/2018; she has been using Hydrofera Blue. We have small wounds on the right lateral calf and 2 small areas that were initially part of a large wound in the right mid tibia. We applied pure apply #1 today. 12/29/2018; we applied puraply #2. Her wounds look somewhat better especially on the right lateral calf and the lateral part of her original wound in the mid tibial area. 01/05/2019; perhaps slightly improved in terms of wound bed condition but certainly not as much improvement as I might of liked. Puraply #3 1/13: we did not have a correct sized puraply to apply. wounds more  pinched out looking, I increased her compression to 3 layer last week to help with significant multilevel venous hypertension. Since then I've reviewed her arterial status. She has a right femoral endarterectomy and a distal left SFA stent. She was being followed by Dr. [name] for a period however she does not appear to have seen him in 3 years. I will set up an appointment. 1/20. The patient has an additional wound on the right lateral calf between the distal wound and proximal wounds. We did not have Puraply last week. Still does not have a follow-up with Dr.  early 1/27: Follow-up with Dr. early has been arranged apparently with follow-up noninvasive studies. Wounds are measuring roughly the same although they certainly look smaller 2/3; the patient had non invasive studies. Her ABIs on the right were 0.83 and on the left 1.02 however there was no great toe pressure bilaterally. Also worrisome monophasic waveforms at the PTA and dorsalis pedis. We are still using Puraply. We have had some improvement in all of the wounds especially the lateral part of the mid tibial area. 2/10; sees Dr. early of vein and vascular re-arterial studies next week. Puraply reapplied today. 2/17; Dr. early of vein and vascular his appointment is tomorrow. Puraply reapplied after debridement of all wounds 2/24; the patient saw Dr. early I reviewed his note. sHe noted the previous right femoral endarterectomy with a Dacron patch. He also noted the normal ABI and the monophasic waveforms suggesting tibial disease. Overall he did not feel that she had any evidence of arterial insufficiency that would impair her wound healing. He did note her venous disease as well. He suggested PRN follow-up. 3/2; I had the last puraply applied today. The original wounds over the mid tibia area are improved where is the area on the right lower leg is not 3/9; wounds are smaller especially in the right mid tibia perhaps slightly in the right lateral calf. We finished with puraply and went to endoform today 3/23; the patient arrives after 2 weeks. She has been using endoform. I think all of her wounds look slightly better which includes the area on the right lateral calf just above the right lateral malleolus and the 2 in the right mid tibia which were initially part of the same wound. 4/27; TELEHEALTH visit; the patient was seen for telehealth visit today with her consent in the middle of the worldwide epidemic. Since she was last here she called in for antibiotics with pain and tenderness around  the area on the right medial ankle. I gave her empiric doxycycline. She states this feels better. She is using endoform on both of these areas 5/11 TELEHEALTH; the patient was seen for telehealth visit today. She was accompanied at home by her husband. She has severe pulmonary hypertension accompanied scleroderma and in the face of the Covid epidemic cannot be safely brought into our clinic. We have been using endoform on her wound areas. There are essentially 3 wound areas now in the left mid tibia now 2 open areas that it one point were connected and one on the right lateral ankle just above the malleolus. 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 She cannot come to clinic. The wounds were reviewed today via telehealth. She has severe chronic venous hypertension which I think is centrally mediated. She has wounds on  her right anterior tibia and right lateral ankle area. These are chronic. She has been using endoform. 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 We have been following her from telehealth. She has severe chronic venous hypertension which may be mostly centrally mediated secondary to right heart heart failure. She has wounds on her right anterior tibia and right lateral ankle these are chronic we have been using endoform 6/22; TELEHEALTH; this patient was seen today via telehealth. 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 Since the last time we had contact with this patient she developed some pain and erythema around the wound on her right lateral malleolus/ankle and we put in antibiotics for  her. This is resulted in good improvement with resolution of the erythema and tenderness. I changed her to silver alginate last time. We had been using endoform for an extended period of time 7/13; TELEHEALTH; this patient was seen today via telehealth. 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 She would continue to be at a prohibitive risk to be brought into our clinic unless this was absolutely necessary. These visits have been done with her approval as well as her husband. We have been using silver alginate to the areas on the right mid tibia and right lateral lower leg. 7/27 TELEHEALTH; patient was seen along with her husband today via telehealth. 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 We changed her to sample last visit. She has 2 areas a chronic wound on her right mid tibia and one just above her ankle. These were not the original wounds when she came into this clinic but she developed them during treatment 8/17; she comes in for her first face-to-face visit in a long period. She has a remaining area just medial to the right tibia which is the last open part of her large wound across this area. She also has an area on the right lateral lower leg. We prescribed Santyl last telehealth visit but they were concerned that this was making a deeper so they put silver alginate on it last week. Her husband changes the dressings. 8/31; using Santyl to the 2 wound areas some improvement in wound surfaces. Husband has surgery in 2 weeks we will put her out 3 weeks. Any of the advanced treatment options that I can think of that would be eligible for this wound would also cause her to have to come in weekly. The risk that the patient is just too high 9/21. Using Santyl to the 2 wound areas. Both of these are somewhat better although the medial mid tibia area still has exposed  muscle. Lateral ankle requiring debridement. Using Santyl 10/12; using Santyl to the 2 wound areas. One on the right lateral ankle and the other in the medial calf which still has exposed muscle. Both areas have come down in size and have better looking surfaces. She has made nice progress with santyl 11/2; we have been using Santyl to the 2 wound areas. Right lateral ankle and the other in the mid tibia area the medial part of this still has exposed muscle. 11/23/2019 on evaluation today patient appears to be doing about the same really with regard to her  wounds. She is actually not very pleased with how things seem to be progressing at this point she tells me that she really has not noted much improvement unfortunately. With that being said there is no signs of active infection at this time. There is some slough buildup noted at this time which again along with some dry skin around the edges of the wound I think would benefit her to try to debride some of this away. Fortunately her pain is doing fairly well. She still has exposed muscle in the right medial/tibial area. 12/14; TELEHEALTH; she was changed to [facility] to the right calf wound and right lateral ankle wound when she was here last time. Unfortunately since then she had a fall with a pelvic fracture and a fracture of her wrist. She was apparently hospitalized for 5 days. I have not looked at her discharge summary. She apparently came out of the hospital with a blister on her right heel. She was seen today via telehealth by myself and our case manager. The patient and her husband were present. She has been using Hydrofera Blue at our direction from the last time she was in the clinic. There is been no major improvement in fact the areas appear deeper and with a less viable surface 01/04/2020; TELEHEALTH; the patient was seen today in accompaniment of her husband and our nurse. She has 3 open areas 1 on the right medial mid tibia, one on  the right lateral ankle and a large eschared pressure area on the right heel. We have been using Iodoflex to the 2 original wounds. 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 It is simply too perilous for her to be seen in any other way 1/26; TELEHEALTH; the patient was seen today in accompaniment of her husband and our RN one of our nurses. She still has the 3 open areas 1 on the right medial mid tibia which is the remanent of a more extensive wound in this area, one on the right lateral ankle and a large eschared pressure area on the right heel. We have been using Iodoflex to the 2 original wounds and a bed at 9 application to the eschared area on the heel 2/15; the first time we have seen this patient and then several months out of concern for the pandemic. She had a large horizontal wound in the mid tibia. Only the medial aspect of this is still open. Area on the lateral ankle is just about closed. She had a new pressure ulcer on the right heel which I have removed some of the eschar. We have been using Iodoflex which I will continue. The area in the mid tibia has a round circle in the middle of exposed muscle. I think we would have to use an advanced treatment product to stimulate granulation over this area. We will run this through her insurance. She is not eligible for plastic surgery for many reasons 3/1; TELEHEALTH. The patient was seen today by telehealth. 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 We have been using Iodoflex to the wound areas which include the right anterior mid tibia, right lateral ankle and the right heel. All of these were reviewed 3/8; the patient's wound just above the right ankle is closed. She still has a contracting black eschar on the heel where she had a pressure ulcer. The medial part of her original wound on the  mid tibia has exposed  muscle. We have made really made no progress in this area although we have managed to get a lot of the original wound in this area to close. We used Apligraf #1 today 3/22; the patient's ankle wound remains closed. She still has a contracting black eschar on the heel although it seems to have less surface area. It still not clear whether there is any depth here. We used Apligraf #2 today in an attempt to get granulation over the exposed muscle and what is left of her mid tibia area. 4/5; everything is closed except the medial aspect of the mid tibia wound, as well as the pressure injury on the right heel. She has been using Betadine to the right heel which has been gradually contracting. We used Apligraf #3 today. 4/19; Apligraf #4. Still a pressure area on the right heel she has been using Betadine superficial excoriated areas she has been applying salicylic acid to on the anterior leg and the thick horn on the leg just above her wound area 5/3; Apligraf #5. Unfortunately she came in today with a reopening of the area on the right lateral lower leg. Not much change in the wound we have been treating in the mid calf. Quite a bit of edema surrounding this wound. She reports that she was up on her feet quite a bit. The area on the right heel is separating she has been using Betadine She comes in with a new wound on the left lateral malleolus which is very disappointing this is been open since last week 5/17; we applied her last Apligraf last time. Unfortunately that did not have any effect on the deep area medially in the mid tibial area. However the area over the right lateral malleolus is a lot better. Right heel also is contracted we used Iodoflex last time. The new wound on the left lateral malleolus were using Santyl to. This required debridement. 6/1. Not much change in the area on the right medial mid tibia. Right lateral ankle is improved she has the necrotic area on the right heel which was a  pressure area. New area on the left lateral malleolus from last time. I changed her to Iodoflex to the area on the left lateral malleolus she said this hurt I have put her back on Santyl 6/15; right mid tibia is unchanged. I do not think there is anything I can do to this topically that we will get this to granulate over the muscle. And I am furthermore I am not sure that she is a surgical candidate either because of her severe pulmonary issues or because she really does not want to go through it. ooThe area on the right lateral malleolus is a small punched out area. ooThe area on the left lateral malleolus again small punched out with nonviable surface ooPressure ulcer on the right heel at separating black eschar. I went ahead and remove this today Lura Em has an area on the left anterior mid tibia just laterally. Geographic wounds debris on the surface. Some erythema here. 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 There is skin changes widely Change the primary dressing to Sorbact to all wound areas. She needs compression on the left leg as well as the right 6/21; right mid tibia which was her one remaining wound at 1 point is unchanged ooThe area on the right lateral malleolus actually is close to closing over still a small punched out area ooThe area on the  left lateral malleolus again a deep wound it is this 1 that she feels pain in. ooPressure ulcer on the right heel was cultured today. ooNew area on the left anterior mid tibia several geographic wounds last week in the setting of chronic venous insufficiency this actually looks some better 7/6; ooRight mid tibia about the same. Exposed muscle ooLeft lateral malleolus again necrotic debris over the surface. ooPressure ulcer on the right heel. She finished the Keflex we prescribed. Necrotic debris debrided with a #3 curette ooThe rest of her wounds on the right mid tibia right lateral ankle are better Her  husband reminds me that she had a right femoral endarterectomy and has 2 stents in her left thigh placed in 2011 approximately by vascular surgery in [place]. She saw Dr. [name] last in February 2020. At that point he did not think that the arterial insufficiency she had on the right was sufficient to explain any of her right lower extremity wounds however she now has an area on the left lateral malleolus that looks like an ischemic wound 7/12; we have been using Sorbact all wound areas ooRight mid tibia about the same exposed muscle ooLeft lateral malleolus small wound with debris in the surface punched out ooPressure ulcer of the right heel requiring debridement of necrotic debris ooLateral ankle wound on the right is just about closed ooRight mid tibia wound about the same still with exposed muscle. 7/26 really no change in any wounds. We have been using Sorbact. She has bilateral lower extremity wounds. This is in the setting of scleroderma, severe venous hypertension. She also has known PAD and I had really hoped to be able to get a review by Dr. [name] but apparently that is not booked until August 31. I have asked her to call back to vein and vascular and get an appointment with somebody a little earlier if that is possible. 8/16; patient's area on the right ankle which was a chronic wound is closed over again. The area on the right mid lower leg, right heel left lateral ankle are all about the same. Pressure ulcer on the right heel is still not viable. New areas identified on the right lateral calf. She has a follow-up with Dr. [name] on 8/31. Phenomenally I would like him to go over her arterial status with regards to her underlying stents. 9/13; patient had her ARTERIAL studies done however apparently Dr. [name] is now in [place] so she did not get an appointment with him on 8/31 and was not referred to any other provider. On the right side her PTA was monophasic. Noncompressible. TBI  on the right at 0.41 on the left she was monophasic and noncompressible they did not do a TBI because she had a Band-Aid on her big toe. Patient does not describe claudication although she is having a lot of pain in the left lateral malleolus. 10/11; the patient had a right-sided interventions on 10/5. She had a angioplasty of the right anterior tibial artery as well as the tibioperoneal trunk/peroneal artery she had a drug-coated balloon angioplasty of the right superficial femoral artery. On the left she did not have any interventions yet but is going to have another look at this in 2 weeks. The superficial femoral artery on the left and the associated stent are patent popliteal artery is patent to the dominant vessel runoff is the anterior tibial which has several high-grade stenosis in the proximal one third. We have been using Sorbact. She also has a new  area on the right upper mid tibia. 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 She tolerated this well. She is using Iodoflex and her husband is doing a kerlix Coban on her. She arrives in clinic today with the original medial part of her mid tibia wound with muscle exposure small area on the right ankle which was part of her original wound she has 2 punched-out areas laterally and 1 above her area anteriorly. Nonviable surfaces similarly the right heel is nonviable. On the left side she has an area on the left foot and the left lateral ankle similar nonviable surfaces. 11/8; patient arrives in clinic today with nothing improved. She has a multiple lightly group of wounds on the right lower leg presumably different etiologies including a skin biopsy, the original wound she had medially and some  punched-out areas that are not of clear defined etiology. She says she was in a lot of pain this week could not have her leg up on the bed did not get completely relief from dropping it over the bed. She also has a small area on the left lateral malleolus and the left lateral foot necrotic surfaces on all of these wounds. We have been using Iodoflex I attempted to put her in 3 layer compression last week that did not go well. We will be backing off on kerlix Coban this week. 11/15; the ultrasound that I sent the patient for last week showed findings consistent with acute deep vein thrombosis involving the right distal femoral vein and right popliteal vein. I was concerned about the combination of Plavix and Xarelto however apparently Dr. [name] felt that was not a problem. She is now going through the acute dosing of Xarelto. She has an appointment with Dr. Trula Slade on 12/5. 11/29; 10 days ago before he left for weeks vacation she called to report pain in the right leg I sent her in some doxycycline her husband thought things were getting better however she required admission to hospital from 11/22 through 11/24 prompted by finding that her hemoglobin was 5.7 she was transfused 2 units of packed cells and her hemoglobin stabilized. She did undergo an endoscopy with findings of significant esophagitis part of which was candidal and part of it reflux. She was discharged on nystatin swish and swallow which she is doing. She seems to be better from that regard. There was no active other active source of bleeding Unfortunately the combination of Xarelto and Plavix was felt to be too much for her. The Xarelto was stopped and an IVC filter was placed she is continued on Plavix. Her big complaint today is pain in the right calf really from the mid aspect superiorly. This is very tender slightly red but not particularly warm. She did see vein and vascular in the hospital I will see if I can pull their  note 12/6; patient was at Dr. Stephens Shire office today. Great toe pressure on the right is 54 on the left 49 she has a 50 to 74% stenosis of the right mid popliteal on the left at 30 to 49% proximal SFA and a 50 to 74% CFA. She was treated with angioplasty of the right ATA, right TP trunk and peroneal artery as well as an angioplasty of the right SFA  to popliteal artery. On 10/26 she underwent angioplasty of the left ATA and left TP trunk she has a history of a left common femoral endarterectomy and left superficial femoral artery stent as well as a right superficial femoral artery stent. In addition to her arterial disease she has significant venous disease with a history of a right greater saphenous vein ablation and a recent DVT in the right femoral and popliteal vein with insertion of an IVC filter. She has remained on Plavix but her anticoagulant was stopped. She continues to have a lot of swelling and pain in the right leg with significant erythema which I am assuming is stasis dermatitis. She has had substantial wound areas on the right leg including right lateral a large necrotic wound with a smaller biopsy site above this. She has original wound on the left mid tibia with a small area above this as well. On the left she has a small area over the left lateral malleolus and the left lateral foot Objective Constitutional Patient is hypertensive.. Pulse regular and within target range for patient.Marland Kitchen Respirations regular, non-labored and within target range.. Temperature is normal and within the target range for the patient.Marland Kitchen Appears in no distress. Vitals Time Taken: 2:19 PM, Height: 68 in, Weight: 132 lbs, BMI: 20.1, Temperature: 98.4 F, Pulse: 101 bpm, Respiratory Rate: 17 breaths/min, Blood Pressure: 175/77 mmHg. General Notes: Wound exam ooMidportion of her right tibia and tibial tuberosity is less tense but still very painful. ooLarge necrotic wound on the right posterior calf small  satellite lesion just above it.. Right lateral malleolus does not look bad. Left lateral malleolus debrided with a #3 curette also the left lateral foot I am unable to get the viable tissue Integumentary (Hair, Skin) Wound #16 status is Open. Original cause of wound was Pressure Injury. The wound is located on the Right Calcaneus. The wound measures 0.7cm length x 1.2cm width x 0.5cm depth; 0.66cm^2 area and 0.33cm^3 volume. There is Fat Layer (Subcutaneous Tissue) exposed. There is no tunneling or undermining noted. There is a small amount of serosanguineous drainage noted. The wound margin is epibole. There is medium (34-66%) pink, pale granulation within the wound bed. There is a small (1-33%) amount of necrotic tissue within the wound bed including Adherent Slough. Wound #19 status is Open. Original cause of wound was Gradually Appeared. The wound is located on the Right,Lateral Malleolus. The wound measures 2cm length x 0.7cm width x 0.2cm depth; 1.1cm^2 area and 0.22cm^3 volume. There is Fat Layer (Subcutaneous Tissue) exposed. There is no tunneling or undermining noted. There is a small amount of serous drainage noted. The wound margin is distinct with the outline attached to the wound base. There is medium (34-66%) pink granulation within the wound bed. There is a medium (34-66%) amount of necrotic tissue within the wound bed including Adherent Slough. Wound #20 status is Open. Original cause of wound was Gradually Appeared. The wound is located on the Left,Lateral Malleolus. The wound measures 1cm length x 0.9cm width x 0.3cm depth; 0.707cm^2 area and 0.212cm^3 volume. There is Fat Layer (Subcutaneous Tissue) exposed. There is no tunneling or undermining noted. There is a small amount of purulent drainage noted. The wound margin is distinct with the outline attached to the wound base. There is no granulation within the wound bed. There is a large (67-100%) amount of necrotic tissue within the  wound bed including Adherent Slough. Wound #23 status is Open. Original cause of wound was Gradually Appeared. The wound is  located on the Right,Posterior Lower Leg. The wound measures 1.4cm length x 1.4cm width x 0.3cm depth; 1.539cm^2 area and 0.462cm^3 volume. There is Fat Layer (Subcutaneous Tissue) exposed. There is no tunneling or undermining noted. There is a medium amount of purulent drainage noted. The wound margin is distinct with the outline attached to the wound base. There is no granulation within the wound bed. There is a large (67-100%) amount of necrotic tissue within the wound bed including Adherent Slough. Wound #24 status is Open. Original cause of wound was Gradually Appeared. The wound is located on the Right,Anterior Lower Leg. The wound measures 1.5cm length x 1.2cm width x 0.4cm depth; 1.414cm^2 area and 0.565cm^3 volume. There is Fat Layer (Subcutaneous Tissue) exposed. There is no tunneling or undermining noted. There is a small amount of purulent drainage noted. The wound margin is distinct with the outline attached to the wound base. There is no granulation within the wound bed. There is a large (67-100%) amount of necrotic tissue within the wound bed including Adherent Slough. Wound #25 status is Open. Original cause of wound was Gradually Appeared. The wound is located on the Left,Lateral Foot. The wound measures 1.1cm length x 0.7cm width x 0.1cm depth; 0.605cm^2 area and 0.06cm^3 volume. There is Fat Layer (Subcutaneous Tissue) exposed. There is no tunneling or undermining noted. There is a small amount of serous drainage noted. The wound margin is distinct with the outline attached to the wound base. There is no granulation within the wound bed. There is a large (67-100%) amount of necrotic tissue within the wound bed including Adherent Slough. Wound #26 status is Open. Original cause of wound was Gradually Appeared. The wound is located on the Right,Lateral Lower Leg.  The wound measures 2.5cm length x 2.5cm width x 0.9cm depth; 4.909cm^2 area and 4.418cm^3 volume. There is Fat Layer (Subcutaneous Tissue) exposed. There is no tunneling or undermining noted. There is a medium amount of purulent drainage noted. The wound margin is flat and intact. There is no granulation within the wound bed. There is a large (67-100%) amount of necrotic tissue within the wound bed including Adherent Slough. Wound #27 status is Open. Original cause of wound was Gradually Appeared. The wound is located on the Right,Distal,Anterior Lower Leg. The wound measures 1cm length x 1cm width x 0.2cm depth; 0.785cm^2 area and 0.157cm^3 volume. There is Fat Layer (Subcutaneous Tissue) exposed. There is no tunneling or undermining noted. There is a small amount of purulent drainage noted. The wound margin is flat and intact. There is no granulation within the wound bed. There is a large (67-100%) amount of necrotic tissue within the wound bed including Adherent Slough. Wound #5 status is Open. Original cause of wound was Gradually Appeared. The wound is located on the Right,Medial Lower Leg. The wound measures 2.2cm length x 2.5cm width x 0.4cm depth; 4.32cm^2 area and 1.728cm^3 volume. There is Fat Layer (Subcutaneous Tissue) exposed. There is no tunneling or undermining noted. There is a medium amount of purulent drainage noted. The wound margin is distinct with the outline attached to the wound base. There is medium (34-66%) pink granulation within the wound bed. There is a medium (34-66%) amount of necrotic tissue within the wound bed including Adherent Slough. 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 There was a Excisional Skin/Subcutaneous Tissue Debridement with a total area of 0.9 sq cm performed by Ricard Dillon., MD. With the following instrument(s): Curette to remove Viable and Non-Viable tissue/material. Material removed includes Subcutaneous Tissue and Slough and after achieving pain control using Other (Benzocaine 20%). No specimens were taken. A time out was conducted at 15:13, prior to the start of the procedure. A Minimum amount of bleeding was controlled with Pressure. The procedure was tolerated well with a pain level of 0 throughout and a pain level of 0 following the procedure. Post Debridement Measurements: 1cm length x 0.9cm width x 0.3cm depth; 0.212cm^3 volume. Character of Wound/Ulcer Post Debridement requires further debridement. Severity of Tissue Post Debridement is: Fat layer exposed. 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 There was a Excisional Skin/Subcutaneous Tissue Debridement with a total area of 0.77 sq cm performed by Ricard Dillon., MD. With the following instrument(s): Curette to remove Viable and Non-Viable tissue/material. Material removed includes Subcutaneous Tissue  and Slough and after achieving pain control using Other (Benzocaine 20%). No specimens were taken. A time out was conducted at 15:13, prior to the start of the procedure. A Minimum amount of bleeding was controlled with Pressure. The procedure was tolerated well with a pain level of 0 throughout and a pain level of 0 following the procedure. Post Debridement Measurements: 1.1cm length x 0.7cm width x 0.1cm depth; 0.06cm^3 volume. Character of Wound/Ulcer Post Debridement requires further debridement. Severity of Tissue Post Debridement is: Fat layer exposed. Post procedure Diagnosis Wound #25: Same as Pre-Procedure Plan Follow-up Appointments: Return Appointment in 2 weeks. Bathing/ Shower/ Hygiene: May shower and wash wound with soap and water. - on days that dressing is changed Edema Control - Lymphedema / SCD / Other: Elevate legs to the level of the heart or above for 30 minutes daily and/or when sitting, a frequency of: - throughout the day Avoid standing for long periods of time. WOUND #16: - Calcaneus Wound Laterality: Right Cleanser: Soap and Water Every Other Day/15 Days Discharge Instructions: May shower and wash wound with dial antibacterial soap and water prior to dressing change. Peri-Wound Care: Sween Lotion (Moisturizing lotion) Every Other Day/15 Days Discharge Instructions: Apply moisturizing lotion as directed Prim Dressing: Santyl Ointment Every Other Day/15 Days ary Discharge Instructions: Apply to wound bed as instructed Secondary Dressing: Woven Gauze Sponge, Non-Sterile 4x4 in Every Other Day/15 Days Discharge Instructions: Apply over primary dressing as directed. Secondary Dressing: ABD Pad, 8x10 Every Other Day/15 Days Discharge Instructions: Apply over primary dressing as directed. Com pression Wrap: Kerlix Roll 4.5x3.1 (in/yd) Every Other Day/15 Days Discharge Instructions: Apply Kerlix and Coban compression as directed. Com pression Wrap: Coban Self-Adherent  Wrap 4x5 (in/yd) Every Other Day/15 Days Discharge Instructions: Apply over  Kerlix as directed. WOUND #19: - Malleolus Wound Laterality: Right, Lateral Cleanser: Soap and Water Every Other Day/15 Days Discharge Instructions: May shower and wash wound with dial antibacterial soap and water prior to dressing change. Peri-Wound Care: Sween Lotion (Moisturizing lotion) Every Other Day/15 Days Discharge Instructions: Apply moisturizing lotion as directed Prim Dressing: Santyl Ointment Every Other Day/15 Days ary Discharge Instructions: Apply to wound bed as instructed Secondary Dressing: Woven Gauze Sponge, Non-Sterile 4x4 in Every Other Day/15 Days Discharge Instructions: Apply over primary dressing as directed. Secondary Dressing: ABD Pad, 8x10 Every Other Day/15 Days Discharge Instructions: Apply over primary dressing as directed. Com pression Wrap: Kerlix Roll 4.5x3.1 (in/yd) Every Other Day/15 Days Discharge Instructions: Apply Kerlix and Coban compression as directed. Com pression Wrap: Coban Self-Adherent Wrap 4x5 (in/yd) Every Other Day/15 Days Discharge Instructions: Apply over Kerlix as directed. WOUND #20: - Malleolus Wound Laterality: Left, Lateral Cleanser: Soap and Water Every Other Day/15 Days Discharge Instructions: May shower and wash wound with dial antibacterial soap and water prior to dressing change. Peri-Wound Care: Sween Lotion (Moisturizing lotion) Every Other Day/15 Days Discharge Instructions: Apply moisturizing lotion as directed Prim Dressing: Santyl Ointment Every Other Day/15 Days ary Discharge Instructions: Apply to wound bed as instructed Secondary Dressing: Woven Gauze Sponge, Non-Sterile 4x4 in Every Other Day/15 Days Discharge Instructions: Apply over primary dressing as directed. Secondary Dressing: ABD Pad, 8x10 Every Other Day/15 Days Discharge Instructions: Apply over primary dressing as directed. Com pression Wrap: Kerlix Roll 4.5x3.1 (in/yd) Every  Other Day/15 Days Discharge Instructions: Apply Kerlix and Coban compression as directed. Com pression Wrap: Coban Self-Adherent Wrap 4x5 (in/yd) Every Other Day/15 Days Discharge Instructions: Apply over Kerlix as directed. WOUND #23: - Lower Leg Wound Laterality: Right, Posterior Cleanser: Soap and Water Every Other Day/15 Days Discharge Instructions: May shower and wash wound with dial antibacterial soap and water prior to dressing change. Peri-Wound Care: Sween Lotion (Moisturizing lotion) Every Other Day/15 Days Discharge Instructions: Apply moisturizing lotion as directed Prim Dressing: Santyl Ointment Every Other Day/15 Days ary Discharge Instructions: Apply to wound bed as instructed Secondary Dressing: Woven Gauze Sponge, Non-Sterile 4x4 in Every Other Day/15 Days Discharge Instructions: Apply over primary dressing as directed. Secondary Dressing: ABD Pad, 8x10 Every Other Day/15 Days Discharge Instructions: Apply over primary dressing as directed. Com pression Wrap: Kerlix Roll 4.5x3.1 (in/yd) Every Other Day/15 Days Discharge Instructions: Apply Kerlix and Coban compression as directed. Com pression Wrap: Coban Self-Adherent Wrap 4x5 (in/yd) Every Other Day/15 Days Discharge Instructions: Apply over Kerlix as directed. WOUND #24: - Lower Leg Wound Laterality: Right, Anterior Cleanser: Soap and Water Every Other Day/15 Days Discharge Instructions: May shower and wash wound with dial antibacterial soap and water prior to dressing change. Peri-Wound Care: Sween Lotion (Moisturizing lotion) Every Other Day/15 Days Discharge Instructions: Apply moisturizing lotion as directed Prim Dressing: Santyl Ointment Every Other Day/15 Days ary Discharge Instructions: Apply to wound bed as instructed Secondary Dressing: Woven Gauze Sponge, Non-Sterile 4x4 in Every Other Day/15 Days Discharge Instructions: Apply over primary dressing as directed. Secondary Dressing: ABD Pad, 8x10 Every Other  Day/15 Days Discharge Instructions: Apply over primary dressing as directed. Com pression Wrap: Kerlix Roll 4.5x3.1 (in/yd) Every Other Day/15 Days Discharge Instructions: Apply Kerlix and Coban compression as directed. Com pression Wrap: Coban Self-Adherent Wrap 4x5 (in/yd) Every Other Day/15 Days Discharge Instructions: Apply over Kerlix as directed. WOUND #25: - Foot Wound Laterality: Left, Lateral Cleanser: Soap and Water Every Other Day/15 Days Discharge Instructions: May shower and  wash wound with dial antibacterial soap and water prior to dressing change. Peri-Wound Care: Sween Lotion (Moisturizing lotion) Every Other Day/15 Days Discharge Instructions: Apply moisturizing lotion as directed Prim Dressing: Santyl Ointment Every Other Day/15 Days ary Discharge Instructions: Apply to wound bed as instructed Secondary Dressing: Woven Gauze Sponge, Non-Sterile 4x4 in Every Other Day/15 Days Discharge Instructions: Apply over primary dressing as directed. Secondary Dressing: ABD Pad, 8x10 Every Other Day/15 Days Discharge Instructions: Apply over primary dressing as directed. Com pression Wrap: Kerlix Roll 4.5x3.1 (in/yd) Every Other Day/15 Days Discharge Instructions: Apply Kerlix and Coban compression as directed. Com pression Wrap: Coban Self-Adherent Wrap 4x5 (in/yd) Every Other Day/15 Days Discharge Instructions: Apply over Kerlix as directed. WOUND #26: - Lower Leg Wound Laterality: Right, Lateral Cleanser: Soap and Water Every Other Day/15 Days Discharge Instructions: May shower and wash wound with dial antibacterial soap and water prior to dressing change. Peri-Wound Care: Sween Lotion (Moisturizing lotion) Every Other Day/15 Days Discharge Instructions: Apply moisturizing lotion as directed Prim Dressing: Santyl Ointment Every Other Day/15 Days ary Discharge Instructions: Apply to wound bed as instructed Secondary Dressing: Woven Gauze Sponge, Non-Sterile 4x4 in Every Other  Day/15 Days Discharge Instructions: Apply over primary dressing as directed. Secondary Dressing: ABD Pad, 8x10 Every Other Day/15 Days Discharge Instructions: Apply over primary dressing as directed. Com pression Wrap: Kerlix Roll 4.5x3.1 (in/yd) Every Other Day/15 Days Discharge Instructions: Apply Kerlix and Coban compression as directed. Com pression Wrap: Coban Self-Adherent Wrap 4x5 (in/yd) Every Other Day/15 Days Discharge Instructions: Apply over Kerlix as directed. WOUND #27: - Lower Leg Wound Laterality: Right, Anterior, Distal Cleanser: Soap and Water Every Other Day/15 Days Discharge Instructions: May shower and wash wound with dial antibacterial soap and water prior to dressing change. Peri-Wound Care: Sween Lotion (Moisturizing lotion) Every Other Day/15 Days Discharge Instructions: Apply moisturizing lotion as directed Prim Dressing: Santyl Ointment Every Other Day/15 Days ary Discharge Instructions: Apply to wound bed as instructed Secondary Dressing: Woven Gauze Sponge, Non-Sterile 4x4 in Every Other Day/15 Days Discharge Instructions: Apply over primary dressing as directed. Secondary Dressing: ABD Pad, 8x10 Every Other Day/15 Days Discharge Instructions: Apply over primary dressing as directed. Com pression Wrap: Kerlix Roll 4.5x3.1 (in/yd) Every Other Day/15 Days Discharge Instructions: Apply Kerlix and Coban compression as directed. Com pression Wrap: Coban Self-Adherent Wrap 4x5 (in/yd) Every Other Day/15 Days Discharge Instructions: Apply over Kerlix as directed. WOUND #5: - Lower Leg Wound Laterality: Right, Medial Cleanser: Soap and Water Every Other Day/15 Days Discharge Instructions: May shower and wash wound with dial antibacterial soap and water prior to dressing change. Peri-Wound Care: Sween Lotion (Moisturizing lotion) Every Other Day/15 Days Discharge Instructions: Apply moisturizing lotion as directed Prim Dressing: Santyl Ointment Every Other Day/15  Days ary Discharge Instructions: Apply to wound bed as instructed Secondary Dressing: Woven Gauze Sponge, Non-Sterile 4x4 in Every Other Day/15 Days Discharge Instructions: Apply over primary dressing as directed. Secondary Dressing: ABD Pad, 8x10 Every Other Day/15 Days Discharge Instructions: Apply over primary dressing as directed. Compression Wrap: Kerlix Roll 4.5x3.1 (in/yd) Every Other Day/15 Days Discharge Instructions: Apply Kerlix and Coban compression as directed. Compression Wrap: Coban Self-Adherent Wrap 4x5 (in/yd) Every Other Day/15 Days Discharge Instructions: Apply over Kerlix as directed. 1. Really no improvement. I think this patient has a combination of severe PAD and right now in the right leg uncontrolled swelling secondary to continued presence of a extensive DVT She has active stasis dermatitis. This is the area that is painful. Marland Kitchen 2.  Ideally I had like to put more compression on the right leg but I do not think the patient could stand it. 3. I gave her tramadol last week which according the patient is at least giving her some chance to rest at night. 4. Still using Santyl to all the wound areas. 5. Absolutely nothing looks particularly viable in terms of a healthy wound surface even after debriding Electronic Signature(s) Signed: 12/06/2020 9:01:39 AM By: Linton Ham MD Entered By: Linton Ham on 12/05/2020 15:39:04 -------------------------------------------------------------------------------- SuperBill Details Patient Name: Date of Service: [name] [name] 12/05/2020 Medical Record Number: [id number] Patient Account Number: [id number] Date of Birth/Sex: Treating RN: 14-Feb-1951 (69 y.o. 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 I87.331 Chronic venous hypertension (idiopathic) with ulcer and inflammation of right lower extremity L89.616 Pressure-induced deep tissue damage of right heel L97.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 L97.528 Non-pressure chronic ulcer of other part of left foot with other specified severity I70.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 I70.248 Atherosclerosis of native arteries of left leg with ulceration of other part of lower leg Facility Procedures CPT4 Code: [id number] Description: 77116 - DEB SUBQ TISSUE 20 SQ CM/< ICD-10 Diagnosis Description L97.528 Non-pressure chronic ulcer of other part of left foot with other specified seve L97.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 Modifier: rity Quantity: 1 Physician Procedures : CPT4 Code Description Modifier [id number] - WC PHYS SUBQ TISS 20 SQ CM ICD-10 Diagnosis Description L97.528 Non-pressure chronic ulcer of other part of left foot with other specified severity L97.328 Non-pressure chronic ulcer of left ankle with  other specified severity Quantity: 1 Electronic Signature(s) Signed: 12/06/2020 9:01:39 AM By: Linton Ham MD Entered By: Linton Ham on 12/05/2020 15:39:44

## 2020-12-14 ENCOUNTER — Encounter (HOSPITAL_BASED_OUTPATIENT_CLINIC_OR_DEPARTMENT_OTHER): Payer: Self-pay

## 2020-12-14 ENCOUNTER — Emergency Department (HOSPITAL_BASED_OUTPATIENT_CLINIC_OR_DEPARTMENT_OTHER)
Admission: EM | Admit: 2020-12-14 | Discharge: 2020-12-14 | Disposition: A | Discharge status: Home / Self Care | Payer: Medicare Other | Attending: Emergency Medicine | Admitting: Emergency Medicine

## 2020-12-14 ENCOUNTER — Emergency Department (HOSPITAL_BASED_OUTPATIENT_CLINIC_OR_DEPARTMENT_OTHER): Payer: Medicare Other

## 2020-12-14 ENCOUNTER — Other Ambulatory Visit: Payer: Self-pay

## 2020-12-14 DIAGNOSIS — W01198A Fall on same level from slipping, tripping and stumbling with subsequent striking against other object, initial encounter: Secondary | ICD-10-CM | POA: Insufficient documentation

## 2020-12-14 DIAGNOSIS — S0083XA Contusion of other part of head, initial encounter: Secondary | ICD-10-CM | POA: Insufficient documentation

## 2020-12-14 DIAGNOSIS — I639 Cerebral infarction, unspecified: Secondary | ICD-10-CM | POA: Diagnosis not present

## 2020-12-14 DIAGNOSIS — H748X2 Other specified disorders of left middle ear and mastoid: Secondary | ICD-10-CM | POA: Diagnosis not present

## 2020-12-14 DIAGNOSIS — Z955 Presence of coronary angioplasty implant and graft: Secondary | ICD-10-CM | POA: Insufficient documentation

## 2020-12-14 DIAGNOSIS — N183 Chronic kidney disease, stage 3 unspecified: Secondary | ICD-10-CM | POA: Diagnosis not present

## 2020-12-14 DIAGNOSIS — I6523 Occlusion and stenosis of bilateral carotid arteries: Secondary | ICD-10-CM | POA: Diagnosis not present

## 2020-12-14 DIAGNOSIS — S0093XA Contusion of unspecified part of head, initial encounter: Secondary | ICD-10-CM | POA: Diagnosis not present

## 2020-12-14 DIAGNOSIS — S0990XA Unspecified injury of head, initial encounter: Secondary | ICD-10-CM | POA: Diagnosis not present

## 2020-12-14 DIAGNOSIS — J321 Chronic frontal sinusitis: Secondary | ICD-10-CM | POA: Diagnosis not present

## 2020-12-14 DIAGNOSIS — I509 Heart failure, unspecified: Secondary | ICD-10-CM | POA: Diagnosis not present

## 2020-12-14 DIAGNOSIS — S0003XA Contusion of scalp, initial encounter: Secondary | ICD-10-CM | POA: Diagnosis not present

## 2020-12-14 DIAGNOSIS — Z79899 Other long term (current) drug therapy: Secondary | ICD-10-CM | POA: Insufficient documentation

## 2020-12-14 DIAGNOSIS — W19XXXA Unspecified fall, initial encounter: Secondary | ICD-10-CM | POA: Diagnosis not present

## 2020-12-14 DIAGNOSIS — I130 Hypertensive heart and chronic kidney disease with heart failure and stage 1 through stage 4 chronic kidney disease, or unspecified chronic kidney disease: Secondary | ICD-10-CM | POA: Insufficient documentation

## 2020-12-14 DIAGNOSIS — E039 Hypothyroidism, unspecified: Secondary | ICD-10-CM | POA: Diagnosis not present

## 2020-12-14 DIAGNOSIS — R52 Pain, unspecified: Secondary | ICD-10-CM | POA: Diagnosis not present

## 2020-12-14 DIAGNOSIS — I10 Essential (primary) hypertension: Secondary | ICD-10-CM | POA: Diagnosis not present

## 2020-12-14 DIAGNOSIS — Y9389 Activity, other specified: Secondary | ICD-10-CM | POA: Diagnosis not present

## 2020-12-14 DIAGNOSIS — S199XXA Unspecified injury of neck, initial encounter: Secondary | ICD-10-CM | POA: Diagnosis not present

## 2020-12-14 LAB — CBC WITH DIFFERENTIAL/PLATELET
Abs Immature Granulocytes: 0.06 10*3/uL (ref 0.00–0.07)
Basophils Absolute: 0 10*3/uL (ref 0.0–0.1)
Basophils Relative: 0 %
Eosinophils Absolute: 0 10*3/uL (ref 0.0–0.5)
Eosinophils Relative: 0 %
HCT: 29.2 % — ABNORMAL LOW (ref 36.0–46.0)
Hemoglobin: 9 g/dL — ABNORMAL LOW (ref 12.0–15.0)
Immature Granulocytes: 1 %
Lymphocytes Relative: 8 %
Lymphs Abs: 0.5 10*3/uL — ABNORMAL LOW (ref 0.7–4.0)
MCH: 30.6 pg (ref 26.0–34.0)
MCHC: 30.8 g/dL (ref 30.0–36.0)
MCV: 99.3 fL (ref 80.0–100.0)
Monocytes Absolute: 0.7 10*3/uL (ref 0.1–1.0)
Monocytes Relative: 11 %
Neutro Abs: 5.1 10*3/uL (ref 1.7–7.7)
Neutrophils Relative %: 80 %
Platelets: 352 10*3/uL (ref 150–400)
RBC: 2.94 MIL/uL — ABNORMAL LOW (ref 3.87–5.11)
RDW: 19.7 % — ABNORMAL HIGH (ref 11.5–15.5)
WBC: 6.4 10*3/uL (ref 4.0–10.5)
nRBC: 0 % (ref 0.0–0.2)

## 2020-12-14 LAB — BASIC METABOLIC PANEL
Anion gap: 14 (ref 5–15)
BUN: 35 mg/dL — ABNORMAL HIGH (ref 8–23)
CO2: 31 mmol/L (ref 22–32)
Calcium: 9.3 mg/dL (ref 8.9–10.3)
Chloride: 89 mmol/L — ABNORMAL LOW (ref 98–111)
Creatinine, Ser: 1.27 mg/dL — ABNORMAL HIGH (ref 0.44–1.00)
GFR, Estimated: 45 mL/min — ABNORMAL LOW (ref >60)
Glucose, Bld: 102 mg/dL — ABNORMAL HIGH (ref 70–99)
Potassium: 3.9 mmol/L (ref 3.5–5.1)
Sodium: 134 mmol/L — ABNORMAL LOW (ref 135–145)

## 2020-12-14 NOTE — ED Notes (Signed)
Patient transported to CT

## 2020-12-14 NOTE — Discharge Instructions (Addendum)
Return for worsening headache, confusion, vomiting

## 2020-12-14 NOTE — ED Provider Notes (Signed)
[facility] EMERGENCY DEPARTMENT Provider Note   CSN: [id number] Arrival date & time: 12/14/20  1356     History Chief Complaint  Patient presents with  . Fall    [name] [name] is a 69 y.o. female.  Patient has headache after a fall just prior to arrival.  Lost her balance while standing hit the back of her head.  Did not lose consciousness.  Patient takes Plavix but no other blood thinners.  Did have recent anemia problem but denies any melena or hematochezia.  Takes iron supplements always has some sort of dark stool.  No other bony tenderness.  The history is provided by the patient and the spouse.  Fall This is a new problem. The current episode started less than 1 hour ago. The problem has been resolved. Associated symptoms include headaches. Pertinent negatives include no chest pain, no abdominal pain and no shortness of breath. Nothing aggravates the symptoms. Nothing relieves the symptoms. She has tried nothing for the symptoms.       Past Medical History:  Diagnosis Date  . Anemia   . Arthritis   . DOE (dyspnea on exertion)   . Epistaxis   . History of chicken pox   . Hypertension   . Hypothyroidism   . Leg ulcer ([place])   . Melanoma ([place]) 1999  . 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[place])   . Non Hodgkin's lymphoma ([place])    Treated with chemo 2010, felt to be cured.  Marland Kitchen PAH (pulmonary artery hypertension) ([place])   . Peripheral arterial occlusive disease ([place])   . Pulmonary hypertension ([place])   . Requires supplemental oxygen    2 liters-3liters when moving  . 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 Gangrene of finger of right hand ([place]) 11/29/2020  . Malnutrition of moderate degree 11/22/2020  . GI bleed 11/21/2020  . Eustachian tube dysfunction, bilateral 07/29/2020  . 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 Wrist fracture 11/24/2019  . Multiple closed stable fractures of  pubic ramus ([place]) 11/23/2019  . Distal radius fracture, left 11/23/2019  . Fall at home, initial encounter 11/23/2019  . AMS (altered mental status) 11/12/2019  . Altered mental status 11/11/2019  . Hypomagnesemia 11/11/2019  . Osteoporosis 01/22/2019  . Hypothyroidism 06/16/2018  . Pulmonary hypertension associated with systemic disorder ([place]) 10/08/2017  . 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 Dry gangrene ([place]) 09/11/2017  . Medication reaction 09/11/2017  . ILD (interstitial lung disease) ([place]) 07/22/2017  . Nasal septal perforation 07/10/2017  . Chronic respiratory failure ([place]) 10/31/2016  . Atherosclerosis of native arteries of right leg with ulceration of unspecified site ([place]) 09/14/2016  . Prolonged Q-T interval on ECG 07/29/2016  . CKD (chronic kidney disease), stage III ([place]) 07/29/2016  . Scleroderma ([place]) 05/06/2016  . Depression 04/24/2016  . Extensor tenosynovitis of wrist, right 02/24/2016  . 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 Hypokalemia 10/07/2015  . 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[place]) 09/01/2015  . Allergic rhinitis 03/08/2015  . High risk medication use 04/09/2013  . Venous hypertension, chronic, with ulcer and inflammation ([place]) 01/29/2013  . Lymphoma, non-Hodgkin's ([place]) 01/27/2013  . Right heart failure ([place]) 01/27/2013  . Varicose veins of lower extremities with ulcer and inflammation ([place]) 07/24/2012  . 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[place]) 04/08/2012  . Pulmonary hypertension associated with  systemic disorder ([place]) 03/25/2012  . Peripheral arterial occlusive disease ([place]) 08/10/2011  . Essential hypertension 08/10/2011  . Hyperlipidemia 08/10/2011  . 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 ABDOMINAL AORTOGRAM W/LOWER EXTREMITY Bilateral 10/04/2020   Procedure: ABDOMINAL  AORTOGRAM W/LOWER EXTREMITY;  Surgeon: Serafina Mitchell, MD;  Location: [facility];  Service: Cardiovascular;  Laterality: Bilateral;  . AMPUTATION Right 10/24/2017   Procedure: RAY resection right index;  Surgeon: Daryll Brod, MD;  Location: [facility];  Service: Orthopedics;  Laterality: Right;  Axillary block  . AMPUTATION Right 11/21/2018   Procedure: AMPUTATION RAY RIGHT MIDDLE FINGER;  Surgeon: Daryll Brod, MD;  Location: [facility];  Service: Orthopedics;  Laterality: Right;  . AMPUTATION Right 10/22/2019   Procedure: AMPUTATION DIGIT RIGHT RING AND SMALL FINGER, DIGITAL SYMPATHECTOMY RIGHT RADIAL ULNAR ARCH;  Surgeon: Daryll Brod, MD;  Location: [facility];  Service: Orthopedics;  Laterality: Right;  AXILARY  . apligraph  12-04-2102,   12-18-2012   bilateral calves   [name]  . biopsy on cerebellum    . COLONOSCOPY WITH PROPOFOL Left 10/07/2015   Procedure: COLONOSCOPY WITH PROPOFOL;  Surgeon: [name], MD;  Location: [facility];  Service: Endoscopy;  Laterality: Left;  . ENDARTERECTOMY FEMORAL Right 09/14/2016   Procedure: ENDARTERECTOMY FEMORAL RIGHT;  Surgeon: [name], MD;  Location: [facility] OR;  Service: Vascular;  Laterality: Right;  . ENDOVENOUS ABLATION SAPHENOUS VEIN W/ LASER  07-24-2012   right greater saphenous vein by [name], M.D.   . ENDOVENOUS ABLATION SAPHENOUS VEIN W/ LASER  10-16-2012   left greater saphenous vein  by Dr. [name]  . ENDOVENOUS ABLATION SAPHENOUS VEIN W/ LASER Right 09-01-2015   endovenous laser ablation right greater saphenous vein by [name] MD  . ESOPHAGOGASTRODUODENOSCOPY N/A 05/07/2016   Procedure: ESOPHAGOGASTRODUODENOSCOPY (EGD);  Surgeon: [name], MD;  Location: [facility] ENDOSCOPY;  Service: Endoscopy;  Laterality: N/A;  . ESOPHAGOGASTRODUODENOSCOPY N/A 11/23/2020   Procedure: ESOPHAGOGASTRODUODENOSCOPY (EGD);  Surgeon: [name], MD;  Location: [facility];  Service: Endoscopy;  Laterality: N/A;  .  ESOPHAGOGASTRODUODENOSCOPY (EGD) WITH PROPOFOL Left 10/07/2015   Procedure: ESOPHAGOGASTRODUODENOSCOPY (EGD) WITH PROPOFOL;  Surgeon: [name], MD;  Location: [facility];  Service: Endoscopy;  Laterality: Left;  . GIVENS CAPSULE STUDY N/A 07/31/2016   Procedure: GIVENS CAPSULE STUDY;  Surgeon: [name], MD;  Location: [facility] ENDOSCOPY;  Service: Endoscopy;  Laterality: N/A;  . GIVENS CAPSULE STUDY N/A 11/23/2020   Procedure: GIVENS CAPSULE STUDY;  Surgeon: [name], MD;  Location: [facility] ENDOSCOPY;  Service: Endoscopy;  Laterality: N/A;  . IR IVC FILTER PLMT / S&I Burke Keels GUID/MOD SED  11/22/2020  . LOWER EXTREMITY ANGIOGRAPHY N/A 10/25/2020   Procedure: LOWER EXTREMITY ANGIOGRAPHY - Left;  Surgeon: Serafina Mitchell, MD;  Location: [facility];  Service: Cardiovascular;  Laterality: N/A;  . melonoma removes Right    behind right knee  . OPEN REDUCTION INTERNAL FIXATION (ORIF) DISTAL RADIAL FRACTURE Left 11/24/2019   Procedure: OPEN REDUCTION INTERNAL FIXATION (ORIF) DISTAL RADIAL FRACTURE;  Surgeon: Altamese Thorndale, MD;  Location: [facility];  Service: Orthopedics;  Laterality: Left;  . PATCH ANGIOPLASTY Right 09/14/2016   Procedure: PATCH ANGIOPLASTY RIGHT FEMORAL ARTERY USING HEMASHIELD PLATINUM FINESSE PATCH;  Surgeon: [name], MD;  Location: [facility];  Service: Vascular;  Laterality: Right;  . PERIPHERAL VASCULAR BALLOON ANGIOPLASTY Right 10/04/2020   Procedure: PERIPHERAL VASCULAR  BALLOON ANGIOPLASTY;  Surgeon: Serafina Mitchell, MD;  Location: [facility];  Service: Cardiovascular;  Laterality: Right;  SFA/PT/AT/PTtrunk  . PERIPHERAL VASCULAR BALLOON ANGIOPLASTY Left 10/25/2020   Procedure: PERIPHERAL VASCULAR BALLOON ANGIOPLASTY;  Surgeon: Serafina Mitchell, MD;  Location: [facility];  Service: Cardiovascular;  Laterality: Left;  anterior tibial and tibioperoneal trunk  . PERIPHERAL VASCULAR CATHETERIZATION N/A 09/07/2016   Procedure: Abdominal Aortogram w/Lower Extremity;   Surgeon: [name], MD;  Location: [facility];  Service: Cardiovascular;  Laterality: N/A;  . RIGHT HEART CATH N/A 04/19/2017   Procedure: Right Heart Cath;  Surgeon: [name], MD;  Location: [facility];  Service: Cardiovascular;  Laterality: N/A;  . RIGHT HEART CATHETERIZATION N/A 11/14/2012   Procedure: RIGHT HEART CATH;  Surgeon: [name], MD;  [facility];  Service: Cardiovascular;  Laterality: N/A;  . rt little toe removal  10/1998  . stent left thigh  3/11  . TENDON TRANSFER Right 10/24/2017   Procedure: transfer extensor indecus propius/extensor digitorum commomus index to middle ring and small;  Surgeon: Daryll Brod, MD;  Location: [facility];  Service: Orthopedics;  Laterality: Right;  . TONSILLECTOMY    . TONSILLECTOMY AND ADENOIDECTOMY    . TUNNELED VENOUS CATHETER PLACEMENT  01/2013  . ULNAR HEAD EXCISION Right 10/24/2017   Procedure: darrach right ulna;  Surgeon: Daryll Brod, MD;  Location: [facility];  Service: Orthopedics;  Laterality: Right;  . VEIN LIGATION AND STRIPPING  05/16/2012   Procedure: VEIN LIGATION AND STRIPPING;  Surgeon: [name], MD;  Location: [facility] OR;  Service: Vascular;  Laterality: Right;  Irrigation and Debridement right lower leg, ligation of saphenous vein.     OB History   No obstetric history on file.     Family History  Problem Relation Age of Onset  . Lupus Father   . Lymphoma Father   . Hyperlipidemia Mother   . Cancer Daughter        sarcoma  . Colon cancer Other     Social History   Tobacco Use  . Smoking status: Never Smoker  . Smokeless tobacco: Never Used  Vaping Use  . Vaping Use: Never used  Substance Use Topics  . Alcohol use: Yes    Alcohol/week: 0.0 standard drinks    Comment: 1-2 drinks per day wine.liquor or beer  . Drug use: No    Home Medications Prior to Admission medications   Medication Sig Start Date End Date Taking? Authorizing Provider  acetaminophen (TYLENOL) 650  MG CR tablet Take 1,300 mg by mouth every 8 (eight) hours as needed for pain.     [provider]  Biotin 5000 MCG TABS Take 5,000 mcg by mouth daily.    [provider]  Calcium Carbonate-Vit D-Min (CALCIUM 1200 PO) Take 1,200 mg by mouth daily.    [provider]  Cholecalciferol (VITAMIN D) 2000 units CAPS Take 2,000 Units by mouth daily.    [provider]  citalopram (CELEXA) 20 MG tablet TAKE 1 TABLET BY MOUTH EVERYDAY AT BEDTIME Patient taking differently: Take 20 mg by mouth at bedtime.  06/30/20   [name], MD  clopidogrel (PLAVIX) 75 MG tablet TAKE 1 TABLET BY MOUTH EVERY DAY Patient taking differently: Take 75 mg by mouth daily.  03/07/20   Larey Dresser, MD  doxycycline (MONODOX) 100 MG capsule Take 100 mg by mouth 2 (two) times daily. Start date : 11/17/20  11/17/20   [provider]  FERREX 150 150 MG capsule TAKE 1 CAPSULE BY MOUTH TWICE A DAY Patient taking differently: Take 150 mg by mouth 2 (two) times daily.  08/08/20   [name], MD  fish oil-omega-3 fatty acids 1000 MG capsule Take 1,000 mg by mouth 2 (two) times daily.     [provider]  folic acid (FOLVITE) 1 MG tablet TAKE 1 TABLET BY MOUTH EVERY DAY Patient taking differently: Take 1 mg by mouth daily.  09/15/20   [name], MD  levothyroxine (SYNTHROID) 50 MCG tablet TAKE 1 TABLET BY MOUTH DAILY BEFORE BREAKFAST Patient taking differently: Take 50 mcg by mouth daily before breakfast.  08/31/20   Baxley, Cresenciano Lick, MD  loperamide (IMODIUM) 2 MG capsule Take 2 mg by mouth daily as needed for diarrhea or loose stools.     [provider]  MAGNESIUM ASPARTATE PO Take 1 tablet by mouth in the morning and at bedtime.    [provider]  Multiple Vitamin (MULTIVITAMIN WITH MINERALS) TABS tablet Take 1 tablet by mouth daily.    [provider]  OPSUMIT 10 MG tablet Take 1 tablet (48m) by mouth daily. Patient taking differently: Take 10 mg  by mouth daily.  04/29/20   Bensimhon, DShaune Pascal MD  OXYGEN Inhale 2-3 L into the lungs continuous.     [provider]  pantoprazole (PROTONIX) 40 MG tablet Take 1 tablet (40 mg total) by mouth 2 (two) times daily. 10/05/20   BElby Showers MD  potassium chloride 20 MEQ/15ML (10%) SOLN TAKE 2 TEASPOONFUL (DILUTED IN WATER) ONCE DAILY Patient taking differently: Take 40 mEq by mouth daily.  08/02/20   BElby Showers MD  predniSONE (DELTASONE) 5 MG tablet Take 5 mg by mouth daily. 04/04/20   [provider]  RESTASIS 0.05 % ophthalmic emulsion Place 2 drops into both eyes 2 (two) times daily.  08/28/19   [provider]  rosuvastatin (CRESTOR) 5 MG tablet Take 1 tablet every Monday, Wednesday, and Friday. Patient taking differently: Take 5 mg by mouth every Monday, Wednesday, and Friday.  08/29/20   Bensimhon, DShaune Pascal MD  SANTYL ointment Apply 1 application topically every other day.  11/16/20   [provider]  sildenafil (REVATIO) 20 MG tablet TAKE 3 TABLETS (60 MG TOTAL) BY MOUTH 3 (THREE) TIMES DAILY. PLEASE CALL FOR OFFICE VISIT [phone] taking differently: Take 60 mg by mouth 3 (three) times daily.  10/24/20   Bensimhon, DShaune Pascal MD  spironolactone (ALDACTONE) 25 MG tablet TAKE 1 TABLET BY MOUTH EVERY DAY Patient taking differently: Take 25 mg by mouth daily.  10/24/20   Bensimhon, DShaune Pascal MD  torsemide (DEMADEX) 20 MG tablet Take 40 mg by mouth See admin instructions. Takes 40 mg in the morning and 20 mg at night    [provider]  traMADol (ULTRAM) 50 MG tablet Take 50 mg by mouth every 6 (six) hours as needed. 11/28/20   [provider]  valACYclovir (VALTREX) 500 MG tablet Take 1 tablet (500 mg total) by mouth 2 (two) times daily. Patient taking differently: Take 500 mg by mouth daily as needed (lip sore).  09/02/19   BElby Showers MD  vitamin B-12 (CYANOCOBALAMIN) 1000 MCG tablet Take 1,000 mcg by mouth daily.    [provider]    Allergies    Levofloxacin, Sulfa antibiotics, Doxycycline hyclate, and Penicillins  Review of Systems   Review of  Systems  Constitutional: Negative for chills and fever.  HENT: Negative for ear pain and sore throat.   Eyes: Negative for pain and visual disturbance.  Respiratory: Negative for cough and shortness of breath.   Cardiovascular: Negative for chest pain and palpitations.  Gastrointestinal: Negative for abdominal pain and vomiting.  Genitourinary: Negative for dysuria and hematuria.  Musculoskeletal: Negative for arthralgias and back pain.  Skin: Negative for color change and rash.  Neurological: Positive for headaches. Negative for dizziness, tremors, seizures, syncope, facial asymmetry, speech difficulty, weakness, light-headedness and numbness.  All other systems reviewed and are negative.   Physical Exam Updated Vital Signs  ED Triage Vitals  Enc Vitals Group     BP 12/14/20 1405 (!) 168/65     Pulse Rate 12/14/20 1405 76     Resp 12/14/20 1405 18     Temp 12/14/20 1405 98 F (36.7 C)     Temp src --      SpO2 12/14/20 1405 100 %     Weight 12/14/20 1401 119 lb (54 kg)     Height 12/14/20 1401 5' 7.5" (1.715 m)     Head Circumference --      Peak Flow --      Pain Score 12/14/20 1401 8     Pain Loc --      Pain Edu? --      Excl. in [place]? --     Physical Exam Constitutional:      General: She is not in acute distress.    Appearance: She is not ill-appearing.  HENT:     Head:     Comments: Hematoma to the posterior scalp    Nose: Nose normal.     Mouth/Throat:     Mouth: Mucous membranes are moist.  Eyes:     Pupils: Pupils are equal, round, and reactive to light.  Cardiovascular:     Rate and Rhythm: Normal rate.     Pulses: Normal pulses.     Heart sounds: Normal heart sounds.  Pulmonary:     Effort: Pulmonary effort is normal.     Breath sounds: Normal breath sounds.  Abdominal:     General: Abdomen is flat.   Musculoskeletal:        General: No tenderness. Normal range of motion.     Cervical back: Normal range of motion and neck supple. No tenderness.     Comments: No midline spinal tenderness  Skin:    Capillary Refill: Capillary refill takes less than 2 seconds.  Neurological:     General: No focal deficit present.     Mental Status: She is alert and oriented to person, place, and time.     Cranial Nerves: No cranial nerve deficit.     Sensory: No sensory deficit.     Coordination: Coordination normal.     Comments: 5+ out of 5 strength throughout, normal sensation, no drift, normal speech     ED Results / Procedures / Treatments   Labs (all labs ordered are listed, but only abnormal results are displayed) Labs Reviewed  CBC WITH DIFFERENTIAL/PLATELET - Abnormal; Notable for the following components:      Result Value   RBC 2.94 (*)    Hemoglobin 9.0 (*)    HCT 29.2 (*)    RDW 19.7 (*)    Lymphs Abs 0.5 (*)    All other components within normal limits  BASIC METABOLIC PANEL - Abnormal; Notable for the following components:   Sodium 134 (*)  Chloride 89 (*)    Glucose, Bld 102 (*)    BUN 35 (*)    Creatinine, Ser 1.27 (*)    GFR, Estimated 45 (*)    All other components within normal limits    EKG None  Radiology No results found.  Procedures Procedures (including critical care time)  Medications Ordered in ED Medications - No data to display  ED Course  I have reviewed the triage vital signs and the nursing notes.  Pertinent labs & imaging results that were available during my care of the patient were reviewed by me and considered in my medical decision making (see chart for details).    MDM Rules/Calculators/A&P                          AARICA WAX is a 69 year old female with history of hypertension, non-Hodgkin's lymphoma who presents to the ED with head injury after a fall.  Normal vitals.  No fever.  No loss consciousness.  Mechanical fall hitting  the back of her head.  No laceration.  Not on blood thinners but is on Plavix.  Has had some anemia problems recently but hemoglobin today is 9.  Otherwise lab work is unremarkable.  CT scan of the head and neck pending at time of handoff to oncoming ED staff.   This chart was dictated using voice recognition software.  Despite best efforts to proofread,  errors can occur which can change the documentation meaning.    Final Clinical Impression(s) / ED Diagnoses Final diagnoses:  Fall, initial encounter  Contusion of head, unspecified part of head, initial encounter    Rx / DC Orders ED Discharge Orders    None       Lennice Sites, DO 12/14/20 1455

## 2020-12-14 NOTE — ED Provider Notes (Signed)
I see the patient in signout from Dr. Ronnald Nian.  Briefly the patient is a 69 year old female who fell while her husband was helping her wash her hair.  Laboratory evaluation without significant finding.  Plan for CT head and C-spine.  If negative plan to discharge home. CT negative for intracranial injury, no c spine fx.  D/c home.    Deno Etienne, DO 12/14/20 1538

## 2020-12-14 NOTE — ED Triage Notes (Signed)
Pt arrives via [facility] From home after a fall this morning where her husband was trying to assist her with washing her hair. Pt uses 3L [place] at home. BP 178/76, HR 86. Small amount of swelling to posterior head, pt has wraps on legs from wound care, reports increased pain to legs following fall. Fall r/t slippers slipping out from under her. Denies any LOC, denies NV, no pain on palpation of head per EMS.

## 2020-12-15 ENCOUNTER — Telehealth: Payer: Self-pay

## 2020-12-15 NOTE — Telephone Encounter (Signed)
Spoke with patient husband following up from patients fall ER visit.  He says she is sore but doing ok.  No follow up appointment needed at this time.  Encouraged to contact the office with any needs.

## 2020-12-15 NOTE — Telephone Encounter (Signed)
Left message following up with patient from hospital visit.

## 2020-12-19 ENCOUNTER — Encounter (HOSPITAL_BASED_OUTPATIENT_CLINIC_OR_DEPARTMENT_OTHER): Payer: Medicare Other | Admitting: Internal Medicine

## 2020-12-19 ENCOUNTER — Other Ambulatory Visit: Payer: Self-pay

## 2020-12-19 DIAGNOSIS — L97322 Non-pressure chronic ulcer of left ankle with fat layer exposed: Secondary | ICD-10-CM | POA: Diagnosis not present

## 2020-12-19 DIAGNOSIS — L97822 Non-pressure chronic ulcer of other part of left lower leg with fat layer exposed: Secondary | ICD-10-CM | POA: Diagnosis not present

## 2020-12-19 DIAGNOSIS — L97522 Non-pressure chronic ulcer of other part of left foot with fat layer exposed: Secondary | ICD-10-CM | POA: Diagnosis not present

## 2020-12-19 DIAGNOSIS — I87332 Chronic venous hypertension (idiopathic) with ulcer and inflammation of left lower extremity: Secondary | ICD-10-CM | POA: Diagnosis not present

## 2020-12-19 DIAGNOSIS — L97523 Non-pressure chronic ulcer of other part of left foot with necrosis of muscle: Secondary | ICD-10-CM | POA: Diagnosis not present

## 2020-12-19 DIAGNOSIS — L89616 Pressure-induced deep tissue damage of right heel: Secondary | ICD-10-CM | POA: Diagnosis not present

## 2020-12-19 DIAGNOSIS — L97811 Non-pressure chronic ulcer of other part of right lower leg limited to breakdown of skin: Secondary | ICD-10-CM | POA: Diagnosis not present

## 2020-12-19 DIAGNOSIS — L97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: Secondary | ICD-10-CM | POA: Diagnosis not present

## 2020-12-19 DIAGNOSIS — L97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: Secondary | ICD-10-CM | POA: Diagnosis not present

## 2020-12-19 DIAGNOSIS — I87331 Chronic venous hypertension (idiopathic) with ulcer and inflammation of right lower extremity: Secondary | ICD-10-CM | POA: Diagnosis not present

## 2020-12-19 NOTE — Progress Notes (Signed)
XENIA, NILE ([id number]) Visit Report for 12/19/2020 Debridement Details Patient Name: Date of Service: [name] [name] 12/19/2020 2:00 PM Medical Record Number: [id number] Patient Account Number: [id number] Date of Birth/Sex: Treating RN: 1951-09-12 (69 y.o. [name] Primary Care Provider: [name] Other Clinician: Referring Provider: Treating Provider/Extender: [name] in Treatment: 456 Debridement Performed for Assessment: Wound #28 Left,Lateral Lower Leg Performed By: Clinician Deon Pilling, RN Debridement Type: Chemical/Enzymatic/Mechanical Agent Used: Santyl Severity of Tissue Pre Debridement: Fat layer exposed Level of Consciousness (Pre-procedure): Awake and Alert Pre-procedure Verification/Time Out Yes - 15:00 Taken: Start Time: 15:00 Bleeding: None Response to Treatment: Procedure was tolerated well Level of Consciousness (Post- Awake and Alert procedure): Post Debridement Measurements of Total Wound Length: (cm) 0.9 Width: (cm) 0.7 Depth: (cm) 0.1 Volume: (cm) 0.049 Character of Wound/Ulcer Post Debridement: Requires Further Debridement Severity of Tissue Post Debridement: Fat layer exposed Post Procedure Diagnosis Same as Pre-procedure Electronic Signature(s) Signed: 12/19/2020 5:38:15 PM By: Levan Hurst RN, BSN Signed: 12/19/2020 7:23:52 PM By: Linton Ham MD Entered By: Levan Hurst on 12/19/2020 17:27:16 -------------------------------------------------------------------------------- Debridement Details Patient Name: Date of Service: [name] [name], [name] 12/19/2020 2:00 PM Medical Record Number: [id number] Patient Account Number: [id number] Date of Birth/Sex: Treating RN: [birth year] (69 y.o. [name] Primary Care Provider: [name] Other Clinician: Referring Provider: Treating Provider/Extender: [name] in Treatment: 456 Debridement Performed for  Assessment: Wound #20 Left,Lateral Malleolus Performed By: Clinician Deon Pilling, RN Debridement Type: Chemical/Enzymatic/Mechanical Agent Used: Santyl Severity of Tissue Pre Debridement: Fat layer exposed Level of Consciousness (Pre-procedure): Awake and Alert Pre-procedure Verification/Time Out Yes - 15:00 Taken: Start Time: 15:00 Bleeding: None Response to Treatment: Procedure was tolerated well Level of Consciousness (Post- Awake and Alert procedure): Post Debridement Measurements of Total Wound Length: (cm) 1 Width: (cm) 0.8 Depth: (cm) 0.5 Volume: (cm) 0.314 Character of Wound/Ulcer Post Debridement: Requires Further Debridement Severity of Tissue Post Debridement: Fat layer exposed Post Procedure Diagnosis Same as Pre-procedure Electronic Signature(s) Signed: 12/19/2020 5:38:15 PM By: Levan Hurst RN, BSN Signed: 12/19/2020 7:23:52 PM By: Linton Ham MD Entered By: Levan Hurst on 12/19/2020 17:27:41 -------------------------------------------------------------------------------- Debridement Details Patient Name: Date of Service: [name] [name], [name] 12/19/2020 2:00 PM Medical Record Number: [id number] Patient Account Number: [id number] Date of Birth/Sex: Treating RN: 08-26-1951 (69 y.o. [name] Primary Care Provider: [name] Other Clinician: Referring Provider: Treating Provider/Extender: [name] in Treatment: 456 Debridement Performed for Assessment: Wound #25 Left,Lateral Foot Performed By: Clinician Deon Pilling, RN Debridement Type: Chemical/Enzymatic/Mechanical Agent Used: Santyl Severity of Tissue Pre Debridement: Fat layer exposed Level of Consciousness (Pre-procedure): Awake and Alert Pre-procedure Verification/Time Out Yes - 15:00 Taken: Start Time: 15:00 Bleeding: None Response to Treatment: Procedure was tolerated well Level of Consciousness (Post- Awake and Alert procedure): Post Debridement  Measurements of Total Wound Length: (cm) 0.7 Width: (cm) 0.5 Depth: (cm) 0.4 Volume: (cm) 0.11 Character of Wound/Ulcer Post Debridement: Requires Further Debridement Severity of Tissue Post Debridement: Fat layer exposed Post Procedure Diagnosis Same as Pre-procedure Electronic Signature(s) Signed: 12/19/2020 5:38:15 PM By: Levan Hurst RN, BSN Signed: 12/19/2020 7:23:52 PM By: Linton Ham MD Entered By: Levan Hurst on 12/19/2020 17:28:14 -------------------------------------------------------------------------------- HPI Details Patient Name: Date of Service: [name] [name], [name] 12/19/2020 2:00 PM Medical Record Number: [id number] Patient Account Number: [id number] Date of Birth/Sex: Treating RN: Dec 20, 1951 (69 y.o. [name] Primary  Care Provider: [name] Other Clinician: Referring Provider: Treating Provider/Extender: [name] in Treatment: 8 History of Present Illness HPI Description: this is a patient who initially came to [place] for wounds on the medial malleoli bilaterally as well as her upper medial lower extremities bilaterally. These wounds eventually healed with assistance of Apligraf's bilaterally. While this was occurring she developed the current wound which opened into a fairly substantial wound on the right lower extremity. These are mostly secondary to venous stasis physiology however the patient also has underlying scleroderma, pulmonary hypertension. The wound has been making good progress lately with the Hydrofera Blue-based dressing. 03/17/2015; patient had a arterial evaluation a year ago. Her right ABI was 0.86 left was 1.0. T brachial index was 0.41 on the right 0.45 on the left. Her oe bilateral common femoral artery waveforms were triphasic. Her white popliteal posterior tibial artery and anterior tibial artery waveforms were monophasic with good amplitude. Luteal artery waveforms were biphasic it was felt that  her bilateral great toe pressures are of normal although adequate for tissue healing. 03/24/2015. The condition of this wound is not really improved that. He was covered with as fibrinous surface slough and eschar. This underwent an aggressive debridement with both a curette and scalpel. I still cannot really get down to what I can would consider to be a viable surface. There is no evidence of infection. Previous workup for ischemia roughly a year ago was negative nevertheless I think that continues to be a concern 04/07/15. The patient arrived for application of second Apligraf. Once again the surface of this wound is certainly less viable than I would like for an advanced treatment option. An aggressive debridement was done. She developed some arteriolar bleeding which required that along pressure and silver alginate. 04/21/15: change in the condition of this wound. Once again it is covered in a gelatinous surface slough. After debridement today surface of the wound looks somewhat better but now a heeling surface 05/05/15 Apligraf #4 applied.Still a lot of slough on this wound. 05/19/15 Apligraf #5 applied. Still a lot of slough on this wound I did not aggressively remove this 06/02/15; continued copious amounts of surface slough. This debridement fairly easily. 06/08/2015 -- the last time she had a venous duplex study done was over 3 years ago and the surgery was prior to that. I have recommended that she sees Dr. early for a another opinion regarding a repeat venous duplex and possibly more endovenous ablation of vein stripping of micro-phlebotomies. 06/16/15; wound has a gelatinous surface eschar that the debridement fairly easily to a point. I don't disagree with the venous workup and perhaps even arterial re-evaluation. She is on prednisone 5 mg and continue his medications for her pulmonary artery hypertension I am not sure if the latter have any wound care healing issues I would need to investigate  this. 06/23/15 continues with a gelatinous surface eschar with of fibrinous underlying. What I can see of this wound does not look unhealthy however I just can't get this material which I think is 2 different layers off. Empiric culture done 06/30/15; unfortunately not a lot of change in this wound. A gelatinous surface eschar is easily removed however it has a tight fibrinous surface underneath the. 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 The wound comes back and basically and unchanged state the. She has a gelatinous surface that is more easily removed however there is a tightly fibrinous surface underneath the.  There is no evidence of infection. She has a vascular follow-up next month. I would have to inject her in order to do a more aggressive debridement of this area 07/21/15 the wound is roughly in the same state albeit the debridement is done with greater ease. There is less of the fibrinous eschar underneath the. There is no evidence of infection. She has follow-up with vascular surgery next week. No evidence of surrounding infection. Her original distal wounds healed while this one formed. 07/28/15; wound is easier to debride. No wound erythema. She is seeing Dr. [name] next week. 08/18/15 Has been to [facility] for repeat ablation. Have been using medihoney pad with some improvement 08/25/15; absolutely no change in the condition of this wound in either its overall size or surface condition in many months now. At one point I had this down to [place] healthier surface I think with [facility] however this did not actually progress towards closure. Do not believe that the wound has actually deteriorated in terms of volume at all. We have been using a medihoney pad which allows easier debridement of the gelatinous eschar but again no overall actual improvement. the patient is going towards an ablation with pain and vascular which I think is scheduled for next week. The only  other investigation that I could first see would be a biopsy. She does have underlying scleroderma 09/02/15 eschar is much easier to debridement however the base of this does not look particularly vibrant. We changed to Iodoflex. The patient had her ablation earlier this week 09/09/15 again the debridement over the base of this wound is easier and the base of the wound looks considerably better. We will continue the Iodoflex. Dr. Tawni Millers has expressed his satisfaction with the result of her ablation 09/15/15 once again the wound is relatively free of surface eschar. No debridement was done today. It has a pale-looking base to it. although this is not as deep as it once was it seems to be expanding especially inferiorly. She has had recent venous ablation but this is no closer to healing.I've gone ahead and done a punch biopsy this from the inferior part of the wound close to normal skin 09/22/15: the wound is relatively free of surface eschar. There is some surrounding eschar. I'm not exactly sure at what level the surface is that I am seeing. 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 No evidence of atypical infection, malignancy. 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 No debridement was done 09/29/15; the wound surface is relatively free of surface eschar. There is eschar surrounding the walls of the wound. Aside from the improvement in the amount of surface slough. This wound has not progressed any towards closure. There is not even a surface that looks like there at this is ready. There is no evidence of any infection nor maligancy based on biopsy I did on 9/15. I continued with the Iodoflex however I am looking towards some alternative to try to promote some closure or filling in of this surface. Consider triple layer Oasis. Collagen did not result in adequate control of the surface slough 10/13/15; the patient was in hospital last week with  severe anemia. The wound looks somewhat better after debridement although there is widening medially. There is no evidence of infection. 10/20/15; patient's wound on the right lateral lower leg is essentially unchanged. This underwent a light surface debridement and in general the debridement is easier and the surface looks improved. I  noted in doing this on the side of the wound what appears to be a piece of calcium deposition. The patient noted that she had previous things on the tips of her fingers. 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 She follows with rheumatology and I have asked them to talk to her about this. In view of that S the nonhealing ulcer may have something to do with calcinosis and also unrelieved Raynaud's disease in this area. I should note that her original wounds on the right and left medial malleolus and the inner aspects of both legs just below the knees did however heal over 10/30/15; the patient's wound on the right lateral lower leg is essentially unchanged. I was able to remove some calcified material from the medial wound edge. 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 Otherwise the wound appears essentially unchanged there is less adherent surface eschar. Some of the calcified material was sent to pathology for analysis 11/17/15. The patient's wound on the right lateral leg is essentially unchanged. Wider Medially. 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 There is some surface slough but I did not debridement this. No evidence of surrounding infection. The wound has calcinosis and ossification which may be  connected with her underlying scleroderma. This will make healing difficult 12/15/15; the wound has less depth surface has a fibrinous slough and calcifications in the wound edge no evidence of infection 12/22/15; the wound definitely has less Fibrinous slough on the base. Calcifications around the wound edges are still evident. Although the wound bed looks healthier it is still pale in appearance. Previous biopsy did not show malignancy 01/04/15; surgical debridement of nonviable slough and subcutaneous tissue the wound cleans up quite nicely but appears to be expanding outward calcifications around the wound edges are still there. 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 She is to see her rheumatologist I'll see if he has any opinions on this. My punch biopsy done in September did not show calcifications although these are clearly evident. 01/19/16 light selective debridement of nonviable surface slough. There is epithelialization medially. This gives me reason for cautious optimism. She has been to see her rheumatologist, there is nothing that can be done for this type of soft tissue calcification associated with scleroderma 02/02/16 no debridement although there is a light surface slough. She has 2 peninsulas of skin 1 inferiorly and one medially. We continue to make a slight and slow but definite progressive here 02/16/16; light surface debridement with more attention to the circumference of the wound bed where the fibrinous eschar is more prevalent. No calcifications detected. She seems to have done nicely with the [facility] with some epithelialization and some improvement in the overall wound volume. 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 Continued attention to the circumference of the wound where the fibrinous eschar in  calcinosis or prevalent. No calcifications were detected. She has continued improvement with Hydrofera Blue. The wound is no longer as deep 03/15/16 surgical debridement done to remove surface escha Especially around the circumference of the wound  where there is nonviable subcutaneous tissue. In spite of this there is considerable improvement in the overall dimensions and depth of the wound. Islands of epithelialization are seen especially medially inferiorly and superiorly to a lesser extent. She is using Hydrofera Blue at home 03/29/16; surgical debridement done to remove surface eschar and nonviable subcutaneous tissue. This cleans up quite nicely mention slightly larger in terms of length and width however depth is less 04/12/16; continued gradual improvement in terms of depth and the condition of the wound base. Debridement is done. Continuing long standing Hydrofera Blue at home with Kerlix Coban wraps 04/26/16; continued gradual improvement in terms of depth and management as well as condition of the wound base. Surgical debridement done she continues with Hydrofera Blue. 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 She initially came to this clinic venous insufficiency ulcers which have long since healed 05/17/16 continued improvement in terms of the depth and measurements of this wound although she has a tightly adherent fibrinous slough each time. We've been continued with long standing Hydrofera Blue which seems to done as well for this wound is many advanced treatment options. 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 She also has chronic venous insufficiency. She has an irritation on her lateral right ankle secondary to our wraps 05/10/16; wound appears to be smaller especially on the medial aspect and especially in the width. Wound was debridement surface looks better. She is also been in the hospital apparently with  anemia again she tells me she had an endoscopy. Since she got home after 3 days which I believe was sometime last week she has had an irritated painful area on the right lateral ankle surrounding the lateral malleolus 05/31/16; much more adherent surface slough today then recently although I don't think the dimensions of changed that much. A more aggressive debridement is required. The irritated area over her medial malleolus is more pruritic and painful and I don't think represents cellulitis 06/14/16; no major change in her wound dimensions however there is more tightly adherent surface slough which is disappointing. As well as she appears to have a new small area medially. Furthermore an irritated uncomfortable area on the lateral aspect of her right foot just below the lateral malleolus. 06/21/16; I'm seeing the patient back in follow-up for the new areas under her major wound on the right anterior leg. She has been using Hydrofera Blue to this area probably for several months now and although the dressings seem to be helping for quite a period of time I think things have stagnated lately. She comes in today with a relatively tight adherent surface slough and really no changes in the wound shape or dimension. The 2 small areas she had inferiorly are tiny but still open they seem improved this well. There is no uncontrolled edema and I don't think there is any evidence of cellulitis. 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 Patient has had vascular evaluations both venous and arterial. I have biopsied this area. There is no obvious infection. The worrisome thing today is that she seems to be developing areas of erythema and epithelial damage on the medial aspect of the right foot. Also to a lesser degree inferior to the actual wound itself. Again I see no obvious changes to suggest cellulitis  however as this is the only  treatable option I will probably give her antibiotics. 07/13/16 no major change in the lady's large anterior right leg wound. Still covered with a very tightly adherent surface slough which is difficult to debridement. There is less erythema around this, culture last week grew pseudomonas I gave her ciprofloxacin. The area on her lateral right malleolus looks better- 08/02/16 the patient's wounds continued to decline. Her original large anterior right leg wound looks deeper. Still adherent surface slough that is difficult to debridement. She has a small area just below this and a punched-out wound just below her lateral malleolus. In the meantime she is been in hospital with apparently an upper GI bleed on Plavix and aspirin. She is now just on Plavix she received 3 units of packed cells 08/23/16; since I last saw this 3 weeks ago, the open large area on her right leg looks about the same syrup. She has a small satellite lesion just underneath this. The area on her medial right ankle is now a deep necrotic wound. I attempted to debridement this however there is just too much pain. 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 She follows with vascular surgery and is scheduled for an angiogram in early September 09/06/16; the patient is going for an arteriogram tomorrow. Her original large wound on the right calf is about the same the satellite lesion underneath it is about the same however the area on her medial ankle is now deeper with exposed tendon. I am no longer attempting to debride these wounds 09/20/16; the patient has undergone a right femoral endarterectomy and Dacron patch angioplasty. This seems to have helped the flow in her right leg. 10/04/16; Arrived today for aggressive debridement of the wounds on her right calf the original wound the one beneath it and a difficult area over her right lateral leg  just above the lateral malleolus. 10/25/16; her 3 open wounds are about the same in terms of dimensions however the surface appears a lot healthier post debridement. Using Iodoflex 10/18/16 we have been using Iodoflex to her wounds which she tolerated with some difficulty. 10/11/16; has been using Santyl for a period of time with some improvement although again very adherent surface slough would prevent any attempted healing this. She has a original wound on the left calf, the satellite underneath that and the most recent wound on the right medial ankle. She has completed revascularization by Dr. early and has had venous ablation earlier. Want to go back to Iodoflex to see if week and get a healthier surface to this wound bed failing this I think she'll need to be taken to the OR and I am prepared to call Dr. Marla Roe to discuss this. She is obviously not a good candidate for general anesthesia however.; 11/08/16; I put her on Iodoflex last time to see if I can get the wound bed any healthier and unfortunately today still had tremendous surface slough. 11/15/16; 4 weeks' worth of Iodoflex with not much improvement. Debridement on the major wounds on her left anterior leg is easier however this does not maintain from week to week. The punched-out area on her medial right ankle 11/29/16; I attempted to change the patient last visit to [facility] however she states this burned and was very uncomfortable therefore we gave her permission to go back to the eye out of complex which she already had at home. Also she noted a lot of pain and  swelling on the lateral aspect of her leg before she traveled to [facility] for the holidays, I called her in doxycycline over the phone. This seems to have helped 12/06/16; Wounds unchanged by in large. Using Iodoflex 12/13/16; her wounds today actually looks somewhat better. The area on the right lateral lower leg has reasonably healthy-looking  granulation and perhaps as actually filled and a bit. Debridement of the 2 wounds on the medial calf is easier and post debridement appears to have a healthier base. We have referred her to Dr. Migdalia Dk for consideration of operative debridement 12/20/16; we have a quick note from Dr. Merri Ray who feels that the patient needs to be referred to an academic center/plastic surgeon. This is due to the complexity of the patient's medical issues as it applies to anything in the OR. We have been using Iodoflex 12/27/16; in general the wounds on the right leg are better in terms of the difficult to remove surface slough. She has been using Iodoflex. She is approved for Apligraf which I anticipated ordering in the next week or 2 when we get a better-looking surface 01/04/16 the deep wounds on the right leg generally look better. Both of them are debrided further surface slough. The area on the lateral right leg was not debrided. She is approved for Apligraf I think I'll probably order this either next week or the week after depending on the surface of the wounds superiorly. We have been using Iodoflex which will continue until then 01/11/16; the deep wounds on the right leg again have a surface slough that requires debridement. I've not been able to get the wound bed on either one of these wounds down to what would be acceptable for an advanced skin stab-like Apligraf. The area on the medial leg has been improving. We have been using hot Iodoflex to all wounds which seems to do the best at at least limiting the nonviable surface 01/24/17; we have continued Iodoflex and all her wound areas. Her debridement Gen. he is easier and the surface underneath this looks viable. Nevertheless these are large area wounds with exposed muscle at least on the anterior parts. We have ordered Apligraf's for 2 weeks from now. The patient will be away next week 02/07/17; the patient was close to have first Apligraf today  however we did not order one. I therefore replaced her Iodoflex. She essentially has 3 large punched- out areas on her right anterior leg and right medial ankle. 02/11/17; Apligraf #1 02/25/17; Apligraf #2. In general some improvement in the right medial ankle and right anterior leg wounds. The larger wound medially perhaps some better 03/11/17; Apligraf #3. In general continued improvement in the right medial ankle and the right anterior leg wounds. 03/25/17 Apligraf #4. In general continued improvement especially on the right medial ankle and the lower 04/08/17; Apligraf #5 in general continued improvement in all wound sites. 04/22/17; post Apligraf #5 her wound beds continued to look a lot better all of them up to the surface of the surrounding skin. Had a caramel-colored slough that I did not debrided in case there is residual Apligraf effect. The wounds are as good as I have seen these looking quite some time. 04/29/17; we applied [place] and last week after completing her Apligraf. Wounds look as though they've contracted somewhat although they have a nonviable surface which was problematic in the past. Apparently she has been approved for further Apligraf's. We applied Iodosorb today after debridement. 05/06/17; we're fortunate enough  today to be able to apply additional Apligraf approved by her insurance. In general all of her wounds look better Apligraf #6 05/24/17; Apligraf #7 continued improvement in all wounds 3 06/10/17 Apligraf #8. Continued improvement in the surface of all wounds. Not much of an improvement in dimensions as I might a follow 06/24/17 Apligraf #9 continued general improvement although not as much change in the wound areas I might of like. She has a new open area on the right anterior lateral ankle very small and superficial. She also has a necrotic wound on the tip of her right index finger probably secondary to severe uncontrolled Raynaud's phenomenon. She is already on  sildenafil and already seen her rheumatologist who gave her Keflex. 07/08/17; Apligraf #10. Generalized improvement although she has a small additional wound just medial to the major wound area. 07/22/17; after discussion we decided not to reorder any further Apligraf's although there is been considerable improvement with these it hasn't been recently. The major wound anteriorly looks better. Smaller wounds beneath this and the more recent one and laterally look about the same. The area on the right lateral lower leg looks about the same 07/29/17 this is a patient who is exceedingly complex. She has advanced scleroderma, crest syndrome including calcinosis, PAD status post revascularization, chronic venous insufficiency status post ablations. She initially presented to this clinic with wounds on her bilateral lower legs however these closed. More recently we have been dealing with a large open area superiorly on the right anterior leg, a smaller wound underneath this and then another one on the right medial lower extremity. These improve significantly with 10 Apligraf applications. Over the last 2-3 weeks we are making good progress with Hydrofera Blue and these seem to be making progress towards closure 08/19/17; wounds continued to make good improvement with Hydrofera Blue and episodic aggressive debridements 08/26/17; still using Hydrofera Blue. Good improvements 09/09/17; using Hydrofera Blue continued improvement. Area on the lateral part of her right leg has only a small remaining open area. The small inferior satellite region is for all intents and purposes closed. Her major wound also is come in in terms of depth and has advancing epithelialization. 09/16/17; using Hydrofera Blue with continued improvement. The smaller satellite wound. We've closed out today along with a new were satellite wound medially. The area on her medial ankle is still open and her major wound is still open but making  improvement. All using Hydrofera Blue. Currently 09/30/17; using Hydrofera Blue. Still a small open area on the lateral right ankle area and her original major wound seems to be making gradual and steady improvement. 10/14/17; still using Hydrofera Blue. Still too small open areas on the right lateral ankle. Her original major wound is horizontal and linear. The most problematic area paradoxically seems to be the area to the medial area wears I thought it would be the lateral. 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 10/28/17; still using Hydrofera Blue. Right lateral ankle has a very small open area superiorly on the most lateral part of the wound. Her original open wound has 2 open areas now separated by normal skin and we've redefined this. 11/11/17; still using Hydrofera Blue area and right lateral ankle continues to have a small open area on mostly the lateral part of the wound. Her original wound has 2 small open areas now separated by a considerable amount of normal skin 11/28/17; the patient called in slightly before Thanksgiving  to report pain and erythema above the wound on the right leg. In the past this is responded well to treatment for cellulitis and I gave her over the phone doxycycline. She stated this resulted in fairly abrupt improvement. We have been using Hydrofera Blue for a prolonged period of time to the larger wound anteriorly into the remaining wound on her right right lateral ankle. The latter is just about closed with only a small linear area and the bottom of the [place]. 12/02/17; use endoform and left the dressing on since last visit. There is no tenderness and no evidence of infection. 12/16/17; patient has been using Endoform but not making much progress. The 2 punched out open areas anteriorly which were the reminiscence of her major wound appear deeper. The area on the lateral aspect of her right calf also appears  deeper. Also she has a puzzling tender swelling above her wound on the right leg. This seems larger than last time. Just above her wounds there appears to be some fluctuance in this area it is not erythematous and there is no crepitus 12/30/17; patient has been using Endoform up until last week we used Hydrofera Blue. Ultrasound of the swelling above her 2 major wounds last time was negative for a fluid collection. I gave her cefaclor for the erythema and tenderness in this area which seems better. Unfortunately both punched out areas anteriorly and the area on her right medial lower leg appear deeper. In fact the lateral of the wounds anteriorly actually looks as though it has exposed tendon and/or muscle sheath. She is not systemically unwell. She is complaining of vaginitis type symptoms presumably Candida from her antibiotics. 01/06/18; we're using santyl. she has 2 punched out areas anteriorly which were initially part of a large wound. Unfortunately medially this is now open to tendon/muscle. All the wounds have the same adherent very difficult to debride surface. 01/20/18; 2 week follow-up using Santyl. She has the 2 punched out areas anteriorly which were initially part of her large surface wound there. Medially this still has exposed muscle. All of these have the same tightly adherent necrotic surface which requires debridement. PuraPly was not accepted by the patient's insurance however her insurance I think it changed therefore we are going to run Apligraf to gain 02/03/18; the patient has been using Santyl. The wound on the right lateral ankle looks improved and the 2 areas anteriorly on the right leg looks about the same. The medial one has exposed muscle. The lateral 1 requiresdebridement. We use PuraPly today for the first week 02/10/18; PuraPly #2. The patient has 3 wounds. The area on the right lateral ankle, 2 areas anteriorly that were part of her original large wound in this area  the medial one has exposed muscle. All of the wounds were lightly debrided with a number 3 curet. PuraPly #2 applied the lateral wound on the calf and the right lateral ankle look better. 02/17/18; PuraPly #3. Patient has 3 wounds. The area on the right lateral ankle in 2 areas internally that were part of her original large wound. The lateral area has exposed muscle. She arrived with some complaints of pain around the right ankle. 02/24/18; PuraPly #4; not much change in any of the 3 wound areas. Right lateral ankle, right lateral calf. Both of these required debridement with a #3 curet. She tolerates this marginally. The area on the medial leg still has exposed muscle. Not much change in dimensions 03/03/18 PuraPly #5. The area on  the medial ankle actually looks better however the 2 separate areas that were original parts of the larger right anterior leg wound look as though they're attempting to coalesce. 03/10/18; PuraPly #6. The area on the medial ankle actually continues to look and measures smaller however the 2 separate areas that were part of the original large wound on the right anterior leg have now coalesced. There hasn't been much improvement here. The lateral area actually has underlying exposed muscle 03/17/18-she is here in follow-up evaluation for ulcerations to the right lower extremity. She is voicing no complaints or concerns. She tolerated debridement. Puraply#7 placed 03/24/18; difficult right lower extremity ulcerations. PuraPly #8 place. She is been approved for [employer]. She did very well with Apligraf today however she is apparently reached her "lifetime max" 03/31/18; marginal improvement with PuraPly although her wounds looked as good as they have in several weeks today. Used TheraSkins #1 04/14/18 TheraSkin #2 today 04/28/18 TheraSkins #3. Wound slightly improved 05/12/18; TheraSkin skin #4. Wound response has been variable. 05/27/18 TheraSkin #5. Generally improvement in all  wound areas. I've also put her in 3 layer compression to help with the severe venous hypertension 06/09/18; patient is done quite well with the TheraSkins unfortunately we have no further applications. I also put her in 3 layer compression last week and that really seems to of helped. 06/16/18; we have been using silver collagen. Wounds are smaller. Still be open area to the muscle layer of her calf however even that is contracted somewhat. She tells me that at night sometimes she has pain on the right lateral calf at the site of her lower wound. Notable that I put her into 3 layer compression about 3 weeks ago. She states that she dangles her leg over the bed that makes it feel better but she does not describe claudication during the day She is going to call her secondary insurance to see if they will continue to cover advanced treatment products I have reviewed her arterial studies from 01/22/17; this showed an ABI in the right of 1 and on the left noncompressible. TBI on the right at 0.30 on the left at 0.34. It is therefore possible she has significant PAD with medial calcification falsely elevating her ABI into the normal range. I'll need to be careful about asking her about this next week it's possible the 3 layer compression is too much 06/23/18; was able to reapply TheraSkin 1 today. Edema control is good and she is not complaining of pain no claudication 07/07/18;no major change. New wound which was apparently a taper removal injury today in our clinic between her 2 wounds on the right calf TheraSkins #2 07/14/18; I think there is some improvement in the right lateral ankle and the medial part of her wound. There is still exposed muscle medially. 07/28/18; two-week follow-up. TheraSkins#3. Unfortunately no major change. She is not a candidate I don't think for skin grafting due to severe venous hypertension associated with her scleroderma and pulmonary hypertension 08/11/18 Patient is here today for  her [employer] application #02 (#5 of the second set). She seems to be doing well and in the base of the wound appears to show some progress at this point. This is the last approved [place]in of the second set. 08/25/18; she has completed TheraSkin. There has been some improvement on the right lateral calf wound as well as the anterior leg wounds. The open area to muscle medially on the anterior leg wound is smaller. I'm going to transition  her back to [facility] under Kerlix Coban change every second day. She reports that she had some calcification removed from her right upper arm. We have had previous problems with calcifications in her wounds on her legs but that has not happened recently 09/08/18;using Hydrofera Blue on both her wound areas. Wounds seem to of contracted nicely. She uses Kerlix Coban wrap and changes at home herself 09/22/2018; using Hydrofera Blue on both her wound areas. Dimensions seem to have come down somewhat. There is certainly less depth in the medial part of the mid tibia wound and I do not think there is any exposed muscle at this point. 10/06/2018; 2-week follow-up. Using [facility] on her wound areas. Dimensions have come down nicely both on the right lateral ankle area in the right mid tibial area. She has no new complaints 10/20/2018; 2-week follow-up. She is using Hydrofera Blue. Not much change from the last time she was here. The area on the lateral ankle has less depth although it has raised edges on one side. I attempted to remove as much of the raised edge as I could without creating more additional wounds. The area on the right anterior mid tibia area looks the same. 11/03/2018; 2-week follow-up she is using Hydrofera Blue. On the right anterior leg she now has 2 wounds separated by a large area of normal skin. The area on the medial part still has I think exposed muscle although this area itself is a lot smaller. The area laterally has some depth. Both areas  with necrotic debris. The area on her right lateral ankle has come in nicely 11/17/2018; patient continues to use Hydrofera Blue. We have been increasing separation of the 2 wounds anteriorly which were at one point joint the area on the right lateral calf continues to have I think some improvement in depth. 12/08/2018; patient continues to use Hydrofera Blue. There is some improvement in the area on the right lateral calf. The 2 areas that were initially part of the original large wound in the mid right tibia are probably about the same. In fact the medial area is probably somewhat larger. We will run puraply through the patient's insurance 12/22/2018; she has been using Hydrofera Blue. We have small wounds on the right lateral calf and 2 small areas that were initially part of a large wound in the right mid tibia. We applied pure apply #1 today. 12/29/2018; we applied puraply #2. Her wounds look somewhat better especially on the right lateral calf and the lateral part of her original wound in the mid tibial area. 01/05/2019; perhaps slightly improved in terms of wound bed condition but certainly not as much improvement as I might of liked. Puraply #3 1/13: we did not have a correct sized puraply to apply. wounds more pinched out looking, I increased her compression to 3 layer last week to help with significant multilevel venous hypertension. Since then I've reviewed her arterial status. She has a right femoral endarterectomy and a distal left SFA stent. She was being followed by Dr. [name] for a period however she does not appear to have seen him in 3 years. I will set up an appointment. 1/20. The patient has an additional wound on the right lateral calf between the distal wound and proximal wounds. We did not have Puraply last week. Still does not have a follow-up with Dr. early 1/27: Follow-up with Dr. early has been arranged apparently with follow-up noninvasive studies. Wounds are measuring  roughly the same  although they certainly look smaller 2/3; the patient had non invasive studies. Her ABIs on the right were 0.83 and on the left 1.02 however there was no great toe pressure bilaterally. Also worrisome monophasic waveforms at the PTA and dorsalis pedis. We are still using Puraply. We have had some improvement in all of the wounds especially the lateral part of the mid tibial area. 2/10; sees Dr. early of vein and vascular re-arterial studies next week. Puraply reapplied today. 2/17; Dr. early of vein and vascular his appointment is tomorrow. Puraply reapplied after debridement of all wounds 2/24; the patient saw Dr. early I reviewed his note. sHe noted the previous right femoral endarterectomy with a Dacron patch. He also noted the normal ABI and the monophasic waveforms suggesting tibial disease. Overall he did not feel that she had any evidence of arterial insufficiency that would impair her wound healing. He did note her venous disease as well. He suggested PRN follow-up. 3/2; I had the last puraply applied today. The original wounds over the mid tibia area are improved where is the area on the right lower leg is not 3/9; wounds are smaller especially in the right mid tibia perhaps slightly in the right lateral calf. We finished with puraply and went to endoform today 3/23; the patient arrives after 2 weeks. She has been using endoform. I think all of her wounds look slightly better which includes the area on the right lateral calf just above the right lateral malleolus and the 2 in the right mid tibia which were initially part of the same wound. 4/27; TELEHEALTH visit; the patient was seen for telehealth visit today with her consent in the middle of the worldwide epidemic. Since she was last here she called in for antibiotics with pain and tenderness around the area on the right medial ankle. I gave her empiric doxycycline. She states this feels better. She is using endoform on  both of these areas 5/11 TELEHEALTH; the patient was seen for telehealth visit today. She was accompanied at home by her husband. She has severe pulmonary hypertension accompanied scleroderma and in the face of the Covid epidemic cannot be safely brought into our clinic. We have been using endoform on her wound areas. There are essentially 3 wound areas now in the left mid tibia now 2 open areas that it one point were connected and one on the right lateral ankle just above the malleolus. 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 She cannot come to clinic. The wounds were reviewed today via telehealth. She has severe chronic venous hypertension which I think is centrally mediated. She has wounds on her right anterior tibia and right lateral ankle area. These are chronic. She has been using endoform. 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 We have been following her from telehealth. She has severe chronic venous hypertension which may be mostly centrally mediated secondary to right heart heart failure. She has wounds on her right anterior tibia and right lateral ankle these are chronic we have been using endoform 6/22; TELEHEALTH; this patient was seen today via telehealth. 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 Since the last time we had contact with this patient she developed some pain and erythema around the wound on her right lateral malleolus/ankle and we put in antibiotics for her. This is resulted in good improvement with resolution of the erythema and tenderness. I changed her to silver alginate  last time. We had been using endoform for an extended period of time 7/13; TELEHEALTH; this patient was seen today via telehealth. 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 She would continue to be at a prohibitive risk to be brought into our clinic unless this was absolutely necessary. These visits have been done with her approval as well as her husband. We have been using silver alginate to the areas on the right mid tibia and right lateral lower leg. 7/27 TELEHEALTH; patient was seen along with her husband today via telehealth. 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 We changed her to sample last visit. She has 2 areas a chronic wound on her right mid tibia and one just above her ankle. These were not the original wounds when she came into this clinic but she developed them during treatment 8/17; she comes in for her first face-to-face visit in a long period. She has a remaining area just medial to the right tibia which is the last open part of her large wound across this area. She also has an area on the right lateral lower leg. We prescribed Santyl last telehealth visit but they were concerned that this was making a deeper so they put silver alginate on it last week. Her husband changes the dressings. 8/31; using Santyl to the 2 wound areas some improvement in wound surfaces. Husband has surgery in 2 weeks we will put her out 3 weeks. Any of the advanced treatment options that I can think of that would be eligible for this wound would also cause her to have to come in weekly. The risk that the patient is just too high 9/21. Using Santyl to the 2 wound areas. Both of these are somewhat better although the medial mid tibia area still has exposed muscle. Lateral ankle requiring debridement. Using Santyl 10/12; using Santyl to the 2 wound areas. One on the right lateral  ankle and the other in the medial calf which still has exposed muscle. Both areas have come down in size and have better looking surfaces. She has made nice progress with santyl 11/2; we have been using Santyl to the 2 wound areas. Right lateral ankle and the other in the mid tibia area the medial part of this still has exposed muscle. 11/23/2019 on evaluation today patient appears to be doing about the same really with regard to her wounds. She is actually not very pleased with how things seem to be progressing at this point she tells me that she really has not noted much improvement unfortunately. With that being said there is no signs of active infection at this time. There is some slough buildup noted at this time which again along with some dry skin around the edges of the wound I think would benefit her to try to debride some of this away. Fortunately her pain is doing fairly well. She still has exposed muscle in the right medial/tibial area. 12/14; TELEHEALTH; she was changed to [facility] to the right calf wound and right lateral ankle wound when she was here last time. Unfortunately since  then she had a fall with a pelvic fracture and a fracture of her wrist. She was apparently hospitalized for 5 days. I have not looked at her discharge summary. She apparently came out of the hospital with a blister on her right heel. She was seen today via telehealth by myself and our case manager. The patient and her husband were present. She has been using Hydrofera Blue at our direction from the last time she was in the clinic. There is been no major improvement in fact the areas appear deeper and with a less viable surface 01/04/2020; TELEHEALTH; the patient was seen today in accompaniment of her husband and our nurse. She has 3 open areas 1 on the right medial mid tibia, one on the right lateral ankle and a large eschared pressure area on the right heel. We have been using Iodoflex to the 2 original  wounds. 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 It is simply too perilous for her to be seen in any other way 1/26; TELEHEALTH; the patient was seen today in accompaniment of her husband and our RN one of our nurses. She still has the 3 open areas 1 on the right medial mid tibia which is the remanent of a more extensive wound in this area, one on the right lateral ankle and a large eschared pressure area on the right heel. We have been using Iodoflex to the 2 original wounds and a bed at 9 application to the eschared area on the heel 2/15; the first time we have seen this patient and then several months out of concern for the pandemic. She had a large horizontal wound in the mid tibia. Only the medial aspect of this is still open. Area on the lateral ankle is just about closed. She had a new pressure ulcer on the right heel which I have removed some of the eschar. We have been using Iodoflex which I will continue. The area in the mid tibia has a round circle in the middle of exposed muscle. I think we would have to use an advanced treatment product to stimulate granulation over this area. We will run this through her insurance. She is not eligible for plastic surgery for many reasons 3/1; TELEHEALTH. The patient was seen today by telehealth. 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 We have been using Iodoflex to the wound areas which include the right anterior mid tibia, right lateral ankle and the right heel. All of these were reviewed 3/8; the patient's wound just above the right ankle is closed. She still has a contracting black eschar on the heel where she had a pressure ulcer. The medial part of her original wound on the mid tibia has exposed muscle. We have made really made no progress in this area although we have managed to get a lot of the original wound in this  area to close. We used Apligraf #1 today 3/22; the patient's ankle wound remains closed. She still has a contracting black eschar on the heel although it seems to have less surface area. It still not clear whether there is any depth here. We used Apligraf #2 today in an attempt to get granulation over the exposed muscle and what is left of her mid tibia area. 4/5; everything is closed except the medial aspect of the mid tibia wound, as well as the pressure injury on the right heel. She  has been using Betadine to the right heel which has been gradually contracting. We used Apligraf #3 today. 4/19; Apligraf #4. Still a pressure area on the right heel she has been using Betadine superficial excoriated areas she has been applying salicylic acid to on the anterior leg and the thick horn on the leg just above her wound area 5/3; Apligraf #5. Unfortunately she came in today with a reopening of the area on the right lateral lower leg. Not much change in the wound we have been treating in the mid calf. Quite a bit of edema surrounding this wound. She reports that she was up on her feet quite a bit. The area on the right heel is separating she has been using Betadine She comes in with a new wound on the left lateral malleolus which is very disappointing this is been open since last week 5/17; we applied her last Apligraf last time. Unfortunately that did not have any effect on the deep area medially in the mid tibial area. However the area over the right lateral malleolus is a lot better. Right heel also is contracted we used Iodoflex last time. The new wound on the left lateral malleolus were using Santyl to. This required debridement. 6/1. Not much change in the area on the right medial mid tibia. Right lateral ankle is improved she has the necrotic area on the right heel which was a pressure area. New area on the left lateral malleolus from last time. I changed her to Iodoflex to the area on the left  lateral malleolus she said this hurt I have put her back on Santyl 6/15; right mid tibia is unchanged. I do not think there is anything I can do to this topically that we will get this to granulate over the muscle. And I am furthermore I am not sure that she is a surgical candidate either because of her severe pulmonary issues or because she really does not want to go through it. The area on the right lateral malleolus is a small punched out area. The area on the left lateral malleolus again small punched out with nonviable surface Pressure ulcer on the right heel at separating black eschar. I went ahead and remove this today she has an area on the left anterior mid tibia just laterally. Geographic wounds debris on the surface. Some erythema here. 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 There is skin changes widely Change the primary dressing to Sorbact to all wound areas. She needs compression on the left leg as well as the right 6/21; right mid tibia which was her one remaining wound at 1 point is unchanged The area on the right lateral malleolus actually is close to closing over still a small punched out area The area on the left lateral malleolus again a deep wound it is this 1 that she feels pain in. Pressure ulcer on the right heel was cultured today. New area on the left anterior mid tibia several geographic wounds last week in the setting of chronic venous insufficiency this actually looks some better 7/6; Right mid tibia about the same. Exposed muscle Left lateral malleolus again necrotic debris over the surface. Pressure ulcer on the right heel. She finished the Keflex we prescribed. Necrotic debris debrided with a #3 curette The rest of her wounds on the right mid tibia right lateral ankle are better Her husband reminds me that she had a right femoral endarterectomy and has 2 stents in her left thigh  placed in 2011 approximately by vascular surgery  in [place]. She saw Dr. [name] last in February 2020. At that point he did not think that the arterial insufficiency she had on the right was sufficient to explain any of her right lower extremity wounds however she now has an area on the left lateral malleolus that looks like an ischemic wound 7/12; we have been using Sorbact all wound areas Right mid tibia about the same exposed muscle Left lateral malleolus small wound with debris in the surface punched out Pressure ulcer of the right heel requiring debridement of necrotic debris Lateral ankle wound on the right is just about closed Right mid tibia wound about the same still with exposed muscle. 7/26 really no change in any wounds. We have been using Sorbact. She has bilateral lower extremity wounds. This is in the setting of scleroderma, severe venous hypertension. She also has known PAD and I had really hoped to be able to get a review by Dr. [name] but apparently that is not booked until August 31. I have asked her to call back to vein and vascular and get an appointment with somebody a little earlier if that is possible. 8/16; patient's area on the right ankle which was a chronic wound is closed over again. The area on the right mid lower leg, right heel left lateral ankle are all about the same. Pressure ulcer on the right heel is still not viable. New areas identified on the right lateral calf. She has a follow-up with Dr. [name] on 8/31. Phenomenally I would like him to go over her arterial status with regards to her underlying stents. 9/13; patient had her ARTERIAL studies done however apparently Dr. [name] is now in [place] so she did not get an appointment with him on 8/31 and was not referred to any other provider. On the right side her PTA was monophasic. Noncompressible. TBI on the right at 0.41 on the left she was monophasic and noncompressible they did not do a TBI because she had a Band-Aid on her big toe. Patient does not  describe claudication although she is having a lot of pain in the left lateral malleolus. 10/11; the patient had a right-sided interventions on 10/5. She had a angioplasty of the right anterior tibial artery as well as the tibioperoneal trunk/peroneal artery she had a drug-coated balloon angioplasty of the right superficial femoral artery. On the left she did not have any interventions yet but is going to have another look at this in 2 weeks. The superficial femoral artery on the left and the associated stent are patent popliteal artery is patent to the dominant vessel runoff is the anterior tibial which has several high-grade stenosis in the proximal one third. We have been using Sorbact. She also has a new area on the right upper mid tibia. 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 She tolerated this well. She is using Iodoflex and her husband is doing a kerlix Coban on her. She arrives in clinic today with the original medial part of her mid tibia wound with muscle exposure small area on the right ankle which was part of her original wound she has 2 punched-out areas laterally and 1 above her area anteriorly. Nonviable surfaces similarly the right heel  is nonviable. On the left side she has an area on the left foot and the left lateral ankle similar nonviable surfaces. 11/8; patient arrives in clinic today with nothing improved. She has a multiple lightly group of wounds on the right lower leg presumably different etiologies including a skin biopsy, the original wound she had medially and some punched-out areas that are not of clear defined etiology. She says she was in a lot of pain this week could not have her leg up on the bed did not get  completely relief from dropping it over the bed. She also has a small area on the left lateral malleolus and the left lateral foot necrotic surfaces on all of these wounds. We have been using Iodoflex I attempted to put her in 3 layer compression last week that did not go well. We will be backing off on kerlix Coban this week. 11/15; the ultrasound that I sent the patient for last week showed findings consistent with acute deep vein thrombosis involving the right distal femoral vein and right popliteal vein. I was concerned about the combination of Plavix and Xarelto however apparently Dr. [name] felt that was not a problem. She is now going through the acute dosing of Xarelto. She has an appointment with Dr. Trula Slade on 12/5. 11/29; 10 days ago before he left for weeks vacation she called to report pain in the right leg I sent her in some doxycycline her husband thought things were getting better however she required admission to hospital from 11/22 through 11/24 prompted by finding that her hemoglobin was 5.7 she was transfused 2 units of packed cells and her hemoglobin stabilized. She did undergo an endoscopy with findings of significant esophagitis part of which was candidal and part of it reflux. She was discharged on nystatin swish and swallow which she is doing. She seems to be better from that regard. There was no active other active source of bleeding Unfortunately the combination of Xarelto and Plavix was felt to be too much for her. The Xarelto was stopped and an IVC filter was placed she is continued on Plavix. Her big complaint today is pain in the right calf really from the mid aspect superiorly. This is very tender slightly red but not particularly warm. She did see vein and vascular in the hospital I will see if I can pull their note 12/6; patient was at Dr. Stephens Shire office today. Great toe pressure on the right is 54 on the left 49 she has a 50 to 74% stenosis of the right mid  popliteal on the left at 30 to 49% proximal SFA and a 50 to 74% CFA. She was treated with angioplasty of the right ATA, right TP trunk and peroneal artery as well as an angioplasty of the right SFA to popliteal artery. On 10/26 she underwent angioplasty of the left ATA and left TP trunk she has a history of a left common femoral endarterectomy and left superficial femoral artery stent as well as a right superficial femoral artery stent. In addition to her arterial disease she has significant venous disease with a history of a right greater saphenous vein ablation and a recent DVT in the right femoral and popliteal vein with insertion of an IVC filter. She has remained on Plavix but her anticoagulant was stopped. She continues to have a lot of swelling and pain in the right leg with significant erythema which I am assuming is stasis dermatitis. She has had substantial wound areas on the  right leg including right lateral a large necrotic wound with a smaller biopsy site above this. She has original wound on the left mid tibia with a small area above this as well. On the left she has a small area over the left lateral malleolus and the left lateral foot 12/22-week follow-up. Her right leg looks really terrible. She has another necrotic wound just above her original one on the right posterior leg. Greenish drainage suggestive of gram-negative/Pseudomonas infection. The entire mid part of her leg is erythematous I think most of this is stasis dermatitis rather than cellulitis. She has also a bothersome ischemic-looking right fourth toe which I noticed today without her really complaining. She has not been systemically unwell although she looks more physically for Electronic Signature(s) Signed: 12/19/2020 7:23:52 PM By: Linton Ham MD Entered By: Linton Ham on 12/19/2020 17:42:40 -------------------------------------------------------------------------------- Physical Exam Details Patient Name:  Date of Service: [name] [name], [name] 12/19/2020 2:00 PM Medical Record Number: [id number] Patient Account Number: [id number] Date of Birth/Sex: Treating RN: Nov 07, 1951 (69 y.o. [name] Primary Care Provider: [name] Other Clinician: Referring Provider: Treating Provider/Extender: [name] in Treatment: 69 Constitutional Patient is hypertensive.. Pulse regular and within target range for patient.Marland Kitchen Respirations regular, non-labored and within target range.. Temperature is normal and within the target range for the patient.Marland Kitchen Appears in no distress. Notes Wound exam The patient has sizable necrotic wounds on the right posterior lateral leg including a new one above the area. Both of these are green with necrotic material on the surface. I did a deep tissue culture from the original area. She has new areas just above this. Her original wounds appear stable On the left lateral leg there is a new wound as well punched-out areas over the left lateral malleolus and left lateral foot. All of these with nonviable surfaces Electronic Signature(s) Signed: 12/19/2020 7:23:52 PM By: Linton Ham MD Entered By: Linton Ham on 12/19/2020 17:43:52 -------------------------------------------------------------------------------- Physician Orders Details Patient Name: Date of Service: [name] [name], [name] 12/19/2020 2:00 PM Medical Record Number: [id number] Patient Account Number: [id number] Date of Birth/Sex: Treating RN: [birth year] (69 y.o. 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 Nurse Visit: - Thursday for rewrap Bathing/ Shower/ Hygiene May shower and wash wound with soap and water. - on days that dressing is changed Edema Control - Lymphedema / SCD / Other Elevate legs to the level of the heart or above for 30 minutes daily and/or when sitting, a frequency of: - throughout the day Avoid standing for long periods of time. Wound Treatment  Wound #16 - Calcaneus Wound Laterality: Right Cleanser: Soap and Water 2 x Per Week/7 Days Discharge Instructions: May shower and wash wound with dial antibacterial soap and water prior to dressing change. Peri-Wound Care: Sween Lotion (Moisturizing lotion) 2 x Per Week/7 Days Discharge Instructions: Apply moisturizing lotion as directed Prim Dressing: IODOFLEX 0.9% Cadexomer Iodine Pad 4x6 cm 2 x Per Week/7 Days ary Discharge Instructions: Apply Iodoflex or Iodosorb to wound bed as instructed Secondary Dressing: Woven Gauze Sponge, Non-Sterile 4x4 in 2 x Per Week/7 Days Discharge Instructions: Apply over primary dressing as directed. Secondary Dressing: ABD Pad, 8x10 2 x Per Week/7 Days Discharge Instructions: Apply over primary dressing as directed. Compression Wrap: Kerlix Roll 4.5x3.1 (in/yd) 2 x Per Week/7 Days Discharge Instructions: Apply Kerlix and Coban  compression as directed. Compression Wrap: Coban Self-Adherent Wrap 4x5 (in/yd) 2 x Per Week/7 Days Discharge Instructions: Apply over Kerlix as directed. Wound #19 - Malleolus Wound Laterality: Right, Lateral Cleanser: Soap and Water 2 x Per Week/7 Days Discharge Instructions: May shower and wash wound with dial antibacterial soap and water prior to dressing change. Peri-Wound Care: Sween Lotion (Moisturizing lotion) 2 x Per Week/7 Days Discharge Instructions: Apply moisturizing lotion as directed Prim Dressing: IODOFLEX 0.9% Cadexomer Iodine Pad 4x6 cm 2 x Per Week/7 Days ary Discharge Instructions: Apply Iodoflex or Iodosorb to wound bed as instructed Secondary Dressing: Woven Gauze Sponge, Non-Sterile 4x4 in 2 x Per Week/7 Days Discharge Instructions: Apply over primary dressing as directed. Secondary Dressing: ABD Pad, 8x10 2 x Per Week/7 Days Discharge Instructions: Apply over primary dressing as directed. Compression Wrap: Kerlix Roll 4.5x3.1 (in/yd) 2 x Per Week/7 Days Discharge Instructions: Apply Kerlix and Coban compression as directed. Compression Wrap: Coban Self-Adherent Wrap 4x5 (in/yd) 2 x Per Week/7 Days Discharge Instructions: Apply over Kerlix as directed. Wound #20 - Malleolus Wound Laterality: Left, Lateral Cleanser: Soap and Water Every Other Day/15 Days Discharge Instructions: May shower and wash wound with dial antibacterial soap and water prior to dressing change. Peri-Wound Care: Sween Lotion (Moisturizing lotion) Every Other Day/15 Days Discharge Instructions: Apply moisturizing lotion as directed Prim Dressing: Santyl Ointment Every Other Day/15 Days ary Discharge Instructions: Apply to wound bed as instructed Secondary Dressing: Woven Gauze Sponge, Non-Sterile 4x4 in Every Other Day/15 Days Discharge Instructions: Apply over primary dressing as directed. Secondary Dressing: ABD Pad, 8x10 Every Other Day/15 Days Discharge Instructions: Apply over primary  dressing as directed. Compression Wrap: Kerlix Roll 4.5x3.1 (in/yd) (Generic) Every Other Day/15 Days Discharge Instructions: Apply Kerlix and Coban compression as directed. Compression Wrap: Coban Self-Adherent Wrap 4x5 (in/yd) (Generic) Every Other Day/15 Days Discharge Instructions: Apply over Kerlix as directed. Wound #23 - Lower Leg Wound Laterality: Right, Posterior Cleanser: Soap and Water 2 x Per Week/7 Days Discharge Instructions: May shower and wash wound with dial antibacterial soap and water prior to dressing change. Peri-Wound Care: Sween Lotion (Moisturizing lotion) 2 x Per Week/7 Days Discharge Instructions: Apply moisturizing lotion as directed Topical: Gentamicin 2 x Per Week/7 Days Discharge Instructions: Under Iodoflex Prim Dressing: IODOFLEX 0.9% Cadexomer Iodine Pad 4x6 cm 2 x Per Week/7 Days ary Discharge Instructions: Apply Iodoflex or Iodosorb to wound bed as instructed Secondary Dressing: Woven Gauze Sponge, Non-Sterile 4x4 in 2 x Per Week/7 Days Discharge Instructions: Apply over primary dressing as directed. Secondary Dressing: ABD Pad, 8x10 2 x Per Week/7 Days Discharge Instructions: Apply over primary dressing as directed. Compression Wrap: Kerlix Roll 4.5x3.1 (in/yd) 2 x Per Week/7 Days Discharge Instructions: Apply Kerlix  and Coban compression as directed. Compression Wrap: Coban Self-Adherent Wrap 4x5 (in/yd) 2 x Per Week/7 Days Discharge Instructions: Apply over Kerlix as directed. Wound #24 - Lower Leg Wound Laterality: Right, Anterior Cleanser: Soap and Water 2 x Per Week/7 Days Discharge Instructions: May shower and wash wound with dial antibacterial soap and water prior to dressing change. Peri-Wound Care: Sween Lotion (Moisturizing lotion) 2 x Per Week/7 Days Discharge Instructions: Apply moisturizing lotion as directed Prim Dressing: IODOFLEX 0.9% Cadexomer Iodine Pad 4x6 cm 2 x Per Week/7 Days ary Discharge Instructions: Apply Iodoflex or Iodosorb  to wound bed as instructed Secondary Dressing: Woven Gauze Sponge, Non-Sterile 4x4 in 2 x Per Week/7 Days Discharge Instructions: Apply over primary dressing as directed. Secondary Dressing: ABD Pad, 8x10 2 x Per Week/7 Days Discharge Instructions: Apply over primary dressing as directed. Compression Wrap: Kerlix Roll 4.5x3.1 (in/yd) 2 x Per Week/7 Days Discharge Instructions: Apply Kerlix and Coban compression as directed. Compression Wrap: Coban Self-Adherent Wrap 4x5 (in/yd) 2 x Per Week/7 Days Discharge Instructions: Apply over Kerlix as directed. Wound #25 - Foot Wound Laterality: Left, Lateral Cleanser: Soap and Water Every Other Day/15 Days Discharge Instructions: May shower and wash wound with dial antibacterial soap and water prior to dressing change. Peri-Wound Care: Sween Lotion (Moisturizing lotion) Every Other Day/15 Days Discharge Instructions: Apply moisturizing lotion as directed Prim Dressing: Santyl Ointment Every Other Day/15 Days ary Discharge Instructions: Apply to wound bed as instructed Secondary Dressing: Woven Gauze Sponge, Non-Sterile 4x4 in Every Other Day/15 Days Discharge Instructions: Apply over primary dressing as directed. Secondary Dressing: ABD Pad, 8x10 Every Other Day/15 Days Discharge Instructions: Apply over primary dressing as directed. Compression Wrap: Kerlix Roll 4.5x3.1 (in/yd) Every Other Day/15 Days Discharge Instructions: Apply Kerlix and Coban compression as directed. Compression Wrap: Coban Self-Adherent Wrap 4x5 (in/yd) Every Other Day/15 Days Discharge Instructions: Apply over Kerlix as directed. Wound #26 - Lower Leg Wound Laterality: Right, Lateral Cleanser: Soap and Water 2 x Per Week/7 Days Discharge Instructions: May shower and wash wound with dial antibacterial soap and water prior to dressing change. Peri-Wound Care: Sween Lotion (Moisturizing lotion) 2 x Per Week/7 Days Discharge Instructions: Apply moisturizing lotion as  directed Prim Dressing: IODOFLEX 0.9% Cadexomer Iodine Pad 4x6 cm 2 x Per Week/7 Days ary Discharge Instructions: Apply Iodoflex or Iodosorb to wound bed as instructed Secondary Dressing: Woven Gauze Sponge, Non-Sterile 4x4 in 2 x Per Week/7 Days Discharge Instructions: Apply over primary dressing as directed. Secondary Dressing: ABD Pad, 8x10 2 x Per Week/7 Days Discharge Instructions: Apply over primary dressing as directed. Compression Wrap: Kerlix Roll 4.5x3.1 (in/yd) 2 x Per Week/7 Days Discharge Instructions: Apply Kerlix and Coban compression as directed. Compression Wrap: Coban Self-Adherent Wrap 4x5 (in/yd) 2 x Per Week/7 Days Discharge Instructions: Apply over Kerlix as directed. Wound #27 - Lower Leg Wound Laterality: Right, Anterior, Distal Cleanser: Soap and Water 2 x Per Week/7 Days Discharge Instructions: May shower and wash wound with dial antibacterial soap and water prior to dressing change. Peri-Wound Care: Sween Lotion (Moisturizing lotion) 2 x Per Week/7 Days Discharge Instructions: Apply moisturizing lotion as directed Prim Dressing: IODOFLEX 0.9% Cadexomer Iodine Pad 4x6 cm 2 x Per Week/7 Days ary Discharge Instructions: Apply Iodoflex or Iodosorb to wound bed as instructed Secondary Dressing: Woven Gauze Sponge, Non-Sterile 4x4 in 2 x Per Week/7 Days Discharge Instructions: Apply over primary dressing as directed. Secondary Dressing: ABD Pad, 8x10 2 x Per Week/7 Days Discharge Instructions: Apply over primary dressing as directed.  Compression Wrap: Kerlix Roll 4.5x3.1 (in/yd) 2 x Per Week/7 Days Discharge Instructions: Apply Kerlix and Coban compression as directed. Compression Wrap: Coban Self-Adherent Wrap 4x5 (in/yd) 2 x Per Week/7 Days Discharge Instructions: Apply over Kerlix as directed. Wound #29 - Lower Leg Wound Laterality: Right, Lateral, Proximal Cleanser: Soap and Water 2 x Per Week/7 Days Discharge Instructions: May shower and wash wound with dial  antibacterial soap and water prior to dressing change. Peri-Wound Care: Sween Lotion (Moisturizing lotion) 2 x Per Week/7 Days Discharge Instructions: Apply moisturizing lotion as directed Prim Dressing: IODOFLEX 0.9% Cadexomer Iodine Pad 4x6 cm 2 x Per Week/7 Days ary Discharge Instructions: Apply Iodoflex or Iodosorb to wound bed as instructed Secondary Dressing: Woven Gauze Sponge, Non-Sterile 4x4 in 2 x Per Week/7 Days Discharge Instructions: Apply over primary dressing as directed. Secondary Dressing: ABD Pad, 8x10 2 x Per Week/7 Days Discharge Instructions: Apply over primary dressing as directed. Compression Wrap: Kerlix Roll 4.5x3.1 (in/yd) 2 x Per Week/7 Days Discharge Instructions: Apply Kerlix and Coban compression as directed. Compression Wrap: Coban Self-Adherent Wrap 4x5 (in/yd) 2 x Per Week/7 Days Discharge Instructions: Apply over Kerlix as directed. Wound #5 - Lower Leg Wound Laterality: Right, Medial Cleanser: Soap and Water 2 x Per Week/7 Days Discharge Instructions: May shower and wash wound with dial antibacterial soap and water prior to dressing change. Peri-Wound Care: Sween Lotion (Moisturizing lotion) 2 x Per Week/7 Days Discharge Instructions: Apply moisturizing lotion as directed Prim Dressing: IODOFLEX 0.9% Cadexomer Iodine Pad 4x6 cm 2 x Per Week/7 Days ary Discharge Instructions: Apply Iodoflex or Iodosorb to wound bed as instructed Secondary Dressing: Woven Gauze Sponge, Non-Sterile 4x4 in 2 x Per Week/7 Days Discharge Instructions: Apply over primary dressing as directed. Secondary Dressing: ABD Pad, 8x10 2 x Per Week/7 Days Discharge Instructions: Apply over primary dressing as directed. Compression Wrap: Kerlix Roll 4.5x3.1 (in/yd) 2 x Per Week/7 Days Discharge Instructions: Apply Kerlix and Coban compression as directed. Compression Wrap: Coban Self-Adherent Wrap 4x5 (in/yd) 2 x Per Week/7 Days Discharge Instructions: Apply over Kerlix as  directed. Laboratory naerobe culture (MICRO) - Right posterior lower leg - (ICD10 L97.213 - Non-pressure chronic Bacteria identified in Unspecified specimen by A ulcer of right calf with necrosis of muscle) LOINC Code: 859-2 Convenience Name: Anerobic culture Patient Medications llergies: Sulfa (Sulfonamide Antibiotics), Levaquin, penicillin A Notifications Medication Indication Start End 12/19/2020 cefdinir DOSE 1 - oral 300 mg capsule - 1 capsule oral 2 times a day x 7 days Electronic Signature(s) Signed: 12/19/2020 5:38:15 PM By: Levan Hurst RN, BSN Signed: 12/19/2020 7:23:52 PM By: Linton Ham MD Entered By: Levan Hurst on 12/19/2020 17:20:07 Prescription 12/19/2020 -------------------------------------------------------------------------------- [name] Guadeloupe MD Patient Name: Provider: 03-13-1951 [id number] Date of Birth: NPI#[name] [name] [id number] Sex: DEA #: [phone] Phone #: License #: [place] Patient Address: [facility] [address] [place], [place] [address] [place], [place] [address] [phone] Allergies Sulfa (Sulfonamide Antibiotics); Levaquin; penicillin Medication Medication: Route: Strength: Form: cefdinir oral 300 mg capsule Class: CEPHALOSPORIN ANTIBIOTICS - 3RD GENERATION Dose: Frequency / Time: Indication: 1 1 capsule oral 2 times a day x 7 days Number of Refills: Number of Units: 0 Generic Substitution: Start Date: End Date: Administered at Facility: Substitution Permitted [id number]/6861 No Note to Pharmacy: Hand Signature: Date(s): Electronic Signature(s) Signed: 12/19/2020 5:38:15 PM By: Levan Hurst RN, BSN Signed: 12/19/2020 7:23:52 PM By: Linton Ham MD Entered By: Levan Hurst on 12/19/2020 17:20:13 --------------------------------------------------------------------------------  Problem List Details Patient Name: Date of  Service: [name] [name] 12/19/2020 2:00 PM Medical Record Number: [id number] Patient Account Number: [id number] Date of Birth/Sex: Treating RN: [birth year] (69 y.o. 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 02/24/2015 No Yes I87.331 Chronic venous hypertension (idiopathic) with ulcer and inflammation of right 10/04/2016 No Yes lower extremity L89.616 Pressure-induced deep tissue damage of right heel 12/14/2019 No Yes L97.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 05/31/2020 No Yes L97.528 Non-pressure chronic ulcer of other part of left foot with other specified 12/05/2020 No Yes severity I70.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 10/31/2020 No Yes lower leg I70.248 Atherosclerosis of native arteries of left leg with ulceration of other part of 10/31/2020 No Yes lower leg Inactive Problems ICD-10 Code Description Active Date Inactive Date 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/19/2016 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 06/24/2017 06/24/2017 S61.200S Unspecified open wound of right index finger without damage to nail, sequela 06/24/2017 06/24/2017 V67.209 Non-pressure chronic ulcer of other part of left lower leg with other specified severity 06/14/2020 06/14/2020 Resolved Problems Electronic Signature(s) Signed: 12/19/2020 7:23:52 PM By: Linton Ham MD Previous Signature: 12/19/2020 5:38:15 PM Version By: Levan Hurst RN, BSN Entered By: Linton Ham on 12/19/2020 17:38:46 -------------------------------------------------------------------------------- Progress Note Details Patient Name: Date of Service: [name] [name], [name] 12/19/2020 2:00 PM Medical Record Number: [id number] Patient Account Number: [id number] Date of Birth/Sex: Treating RN: 01/30/1951 (69 y.o. [name] Primary Care Provider: [name] Other Clinician: Referring Provider: Treating Provider/Extender: [name] in Treatment: 456 Subjective History of Present Illness (HPI) this is a patient who initially came to [place] for wounds on the medial malleoli bilaterally as well as her upper medial lower extremities bilaterally. These wounds eventually healed with assistance of Apligraf's bilaterally. While this was occurring she developed the current wound which opened into a fairly substantial wound on the right lower extremity. These are mostly secondary to venous stasis physiology however the patient also has underlying scleroderma, pulmonary hypertension. The wound has been making good progress lately with the Hydrofera Blue-based dressing. 03/17/2015; patient had a arterial evaluation a year ago. Her right ABI was 0.86 left was 1.0. T brachial index was 0.41 on the right 0.45 on the left. Her oe bilateral common femoral artery waveforms were triphasic. Her white popliteal posterior tibial artery and anterior tibial artery waveforms were monophasic with good amplitude. Luteal artery waveforms were biphasic it was felt that her bilateral great toe pressures are of normal although adequate for tissue healing. 03/24/2015. The condition of this wound is not really improved that. He was covered with as fibrinous surface slough and eschar. This underwent an aggressive debridement with both a curette and scalpel. I still cannot really get down to what I can would consider to be a viable surface. There is no  evidence of infection. Previous workup for ischemia roughly a year ago was negative nevertheless I think that continues to be a concern 04/07/15. The patient arrived  for application of second Apligraf. Once again the surface of this wound is certainly less viable than I would like for an advanced treatment option. An aggressive debridement was done. She developed some arteriolar bleeding which required that along pressure and silver alginate. 04/21/15: change in the condition of this wound. Once again it is covered in a gelatinous surface slough. After debridement today surface of the wound looks somewhat better but now a heeling surface 05/05/15 Apligraf #4 applied.Still a lot of slough on this wound. 05/19/15 Apligraf #5 applied. Still a lot of slough on this wound I did not aggressively remove this 06/02/15; continued copious amounts of surface slough. This debridement fairly easily. 06/08/2015 -- the last time she had a venous duplex study done was over 3 years ago and the surgery was prior to that. I have recommended that she sees Dr. early for a another opinion regarding a repeat venous duplex and possibly more endovenous ablation of vein stripping of micro-phlebotomies. 06/16/15; wound has a gelatinous surface eschar that the debridement fairly easily to a point. I don't disagree with the venous workup and perhaps even arterial re-evaluation. She is on prednisone 5 mg and continue his medications for her pulmonary artery hypertension I am not sure if the latter have any wound care healing issues I would need to investigate this. 06/23/15 continues with a gelatinous surface eschar with of fibrinous underlying. What I can see of this wound does not look unhealthy however I just can't get this material which I think is 2 different layers off. Empiric culture done 06/30/15; unfortunately not a lot of change in this wound. A gelatinous surface eschar is easily removed however it has a tight fibrinous surface  underneath the. 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 The wound comes back and basically and unchanged state the. She has a gelatinous surface that is more easily removed however there is a tightly fibrinous surface underneath the. There is no evidence of infection. She has a vascular follow-up next month. I would have to inject her in order to do a more aggressive debridement of this area 07/21/15 the wound is roughly in the same state albeit the debridement is done with greater ease. There is less of the fibrinous eschar underneath the. There is no evidence of infection. She has follow-up with vascular surgery next week. No evidence of surrounding infection. Her original distal wounds healed while this one formed. 07/28/15; wound is easier to debride. No wound erythema. She is seeing Dr. [name] next week. 08/18/15 Has been to [place] for repeat ablation. Have been using medihoney pad with some improvement 08/25/15; absolutely no change in the condition of this wound in either its overall size or surface condition in many months now. At one point I had this down to [place] healthier surface I think with [facility] however this did not actually progress towards closure. Do not believe that the wound has actually deteriorated in terms of volume at all. We have been using a medihoney pad which allows easier debridement of the gelatinous eschar but again no overall actual improvement. the patient is going towards an ablation with pain and vascular which I think is scheduled for next week. The only other investigation that I could first see would be a biopsy. She does have underlying scleroderma 09/02/15 eschar is much easier to debridement however the base of this does not look particularly vibrant. We changed to Iodoflex. The patient had her ablation earlier  this week 09/09/15 again the debridement over the base of this wound is easier and the base of the wound looks considerably  better. We will continue the Iodoflex. Dr. Tawni Millers has expressed his satisfaction with the result of her ablation 09/15/15 once again the wound is relatively free of surface eschar. No debridement was done today. It has a pale-looking base to it. although this is not as deep as it once was it seems to be expanding especially inferiorly. She has had recent venous ablation but this is no closer to healing.I've gone ahead and done a punch biopsy this from the inferior part of the wound close to normal skin 09/22/15: the wound is relatively free of surface eschar. There is some surrounding eschar. I'm not exactly sure at what level the surface is that I am seeing. 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 No evidence of atypical infection, malignancy. 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 No debridement was done 09/29/15; the wound surface is relatively free of surface eschar. There is eschar surrounding the walls of the wound. Aside from the improvement in the amount of surface slough. This wound has not progressed any towards closure. There is not even a surface that looks like there at this is ready. There is no evidence of any infection nor maligancy based on biopsy I did on 9/15. I continued with the Iodoflex however I am looking towards some alternative to try to promote some closure or filling in of this surface. Consider triple layer Oasis. Collagen did not result in adequate control of the surface slough 10/13/15; the patient was in hospital last week with severe anemia. The wound looks somewhat better after debridement although there is widening medially. There is no evidence of infection. 10/20/15; patient's wound on the right lateral lower leg is essentially unchanged. This underwent a light surface debridement and in general the debridement is easier and the surface looks improved. I noted in doing this on the side of the wound what  appears to be a piece of calcium deposition. The patient noted that she had previous things on the tips of her fingers. 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 She follows with rheumatology and I have asked them to talk to her about this. In view of that S the nonhealing ulcer may have something to do with calcinosis and also unrelieved Raynaud's disease in this area. I should note that her original wounds on the right and left medial malleolus and the inner aspects of both legs just below the knees did however heal over 10/30/15; the patient's wound on the right lateral lower leg is essentially unchanged. I was able to remove some calcified material from the medial wound edge. 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 Otherwise the wound appears essentially unchanged there is less adherent surface eschar. Some of the calcified material was sent to pathology for analysis 11/17/15. The patient's wound on the right lateral leg is essentially unchanged. Wider Medially. 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 There is some surface slough but I did not debridement this. No evidence of surrounding infection. The wound has calcinosis and ossification which may be connected  with her underlying scleroderma. This will make healing difficult 12/15/15; the wound has less depth surface has a fibrinous slough and calcifications in the wound edge no evidence of infection 12/22/15; the wound definitely has less Fibrinous slough on the base. Calcifications around the wound edges are still evident. Although the wound bed looks healthier it is still pale in  appearance. Previous biopsy did not show malignancy 01/04/15; surgical debridement of nonviable slough and subcutaneous tissue the wound cleans up quite nicely but appears to be expanding outward calcifications around the wound edges are still there. 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 She is to see her rheumatologist I'll see if he has any opinions on this. My punch biopsy done in September did not show calcifications although these are clearly evident. 01/19/16 light selective debridement of nonviable surface slough. There is epithelialization medially. This gives me reason for cautious optimism. She has been to see her rheumatologist, there is nothing that can be done for this type of soft tissue calcification associated with scleroderma 02/02/16 no debridement although there is a light surface slough. She has 2 peninsulas of skin 1 inferiorly and one medially. We continue to make a slight and slow but definite progressive here 02/16/16; light surface debridement with more attention to the circumference of the wound bed where the fibrinous eschar is more prevalent. No calcifications detected. She seems to have done nicely with the [facility] with some epithelialization and some improvement in the overall wound volume. 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 Continued attention to the circumference of the wound where the fibrinous eschar in calcinosis or prevalent. No calcifications were detected. She has continued improvement with Hydrofera Blue. The wound is no longer as deep 03/15/16 surgical debridement done to remove surface escha Especially around the circumference of the wound where there is nonviable subcutaneous tissue. In spite of this there is considerable improvement in the overall dimensions and depth of the wound.  Islands of epithelialization are seen especially medially inferiorly and superiorly to a lesser extent. She is using Hydrofera Blue at home 03/29/16; surgical debridement done to remove surface eschar and nonviable subcutaneous tissue. This cleans up quite nicely mention slightly larger in terms of length and width however depth is less 04/12/16; continued gradual improvement in terms of depth and the condition of the wound base. Debridement is done. Continuing long standing Hydrofera Blue at home with Kerlix Coban wraps 04/26/16; continued gradual improvement in terms of depth and management as well as condition of the wound base. Surgical debridement done she continues with Hydrofera Blue. 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 She initially came to this clinic venous insufficiency ulcers which have long since healed 05/17/16 continued improvement in terms of the depth and measurements of this wound although she has a tightly adherent fibrinous slough each time. We've been continued with long standing Hydrofera Blue which seems to done as well for this wound is many advanced treatment options. 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 She also has chronic venous insufficiency. She has an irritation on her lateral right ankle secondary to our wraps 05/10/16; wound appears to be smaller especially on the medial aspect and especially in the width. Wound was debridement surface looks better. She is also been in the hospital apparently with anemia  again she tells me she had an endoscopy. Since she got home after 3 days which I believe was sometime last week she has had an irritated painful area on the right lateral ankle surrounding the lateral malleolus 05/31/16; much more adherent surface slough today then recently although I don't think the dimensions of changed that much. A more aggressive debridement is required. The  irritated area over her medial malleolus is more pruritic and painful and I don't think represents cellulitis 06/14/16; no major change in her wound dimensions however there is more tightly adherent surface slough which is disappointing. As well as she appears to have a new small area medially. Furthermore an irritated uncomfortable area on the lateral aspect of her right foot just below the lateral malleolus. 06/21/16; I'm seeing the patient back in follow-up for the new areas under her major wound on the right anterior leg. She has been using Hydrofera Blue to this area probably for several months now and although the dressings seem to be helping for quite a period of time I think things have stagnated lately. She comes in today with a relatively tight adherent surface slough and really no changes in the wound shape or dimension. The 2 small areas she had inferiorly are tiny but still open they seem improved this well. There is no uncontrolled edema and I don't think there is any evidence of cellulitis. 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 Patient has had vascular evaluations both venous and arterial. I have biopsied this area. There is no obvious infection. The worrisome thing today is that she seems to be developing areas of erythema and epithelial damage on the medial aspect of the right foot. Also to a lesser degree inferior to the actual wound itself. Again I see no obvious changes to suggest cellulitis however as this is the only treatable option I will probably give her antibiotics. 07/13/16 no major change in the lady's large anterior right leg wound. Still covered with a very tightly adherent surface slough which is difficult to debridement. There is less erythema around this, culture last week grew pseudomonas I gave her ciprofloxacin. The area on her lateral right malleolus looks  better- 08/02/16 the patient's wounds continued to decline. Her original large anterior right leg wound looks deeper. Still adherent surface slough that is difficult to debridement. She has a small area just below this and a punched-out wound just below her lateral malleolus. In the meantime she is been in hospital with apparently an upper GI bleed on Plavix and aspirin. She is now just on Plavix she received 3 units of packed cells 08/23/16; since I last saw this 3 weeks ago, the open large area on her right leg looks about the same syrup. She has a small satellite lesion just underneath this. The area on her medial right ankle is now a deep necrotic wound. I attempted to debridement this however there is just too much pain. 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 She follows with vascular surgery and is scheduled for an angiogram in early September 09/06/16; the patient is going for an arteriogram tomorrow. Her original large wound on the right calf is about the same the satellite lesion underneath it is about the same however the area on her medial ankle is now deeper with exposed tendon.  I am no longer attempting to debride these wounds 09/20/16; the patient has undergone a right femoral endarterectomy and Dacron patch angioplasty. This seems to have helped the flow in her right leg. 10/04/16; Arrived today for aggressive debridement of the wounds on her right calf the original wound the one beneath it and a difficult area over her right lateral leg just above the lateral malleolus. 10/25/16; her 3 open wounds are about the same in terms of dimensions however the surface appears a lot healthier post debridement. Using Iodoflex 10/18/16 we have been using Iodoflex to her wounds which she tolerated with some difficulty. 10/11/16; has been using Santyl for a period of time with some improvement although again very adherent surface  slough would prevent any attempted healing this. She has a original wound on the left calf, the satellite underneath that and the most recent wound on the right medial ankle. She has completed revascularization by Dr. early and has had venous ablation earlier. Want to go back to Iodoflex to see if week and get a healthier surface to this wound bed failing this I think she'll need to be taken to the OR and I am prepared to call Dr. Marla Roe to discuss this. She is obviously not a good candidate for general anesthesia however.; 11/08/16; I put her on Iodoflex last time to see if I can get the wound bed any healthier and unfortunately today still had tremendous surface slough. 11/15/16; 4 weeks' worth of Iodoflex with not much improvement. Debridement on the major wounds on her left anterior leg is easier however this does not maintain from week to week. The punched-out area on her medial right ankle 11/29/16; I attempted to change the patient last visit to [facility] however she states this burned and was very uncomfortable therefore we gave her permission to go back to the eye out of complex which she already had at home. Also she noted a lot of pain and swelling on the lateral aspect of her leg before she traveled to [facility] for the holidays, I called her in doxycycline over the phone. This seems to have helped 12/06/16; Wounds unchanged by in large. Using Iodoflex 12/13/16; her wounds today actually looks somewhat better. The area on the right lateral lower leg has reasonably healthy-looking granulation and perhaps as actually filled and a bit. Debridement of the 2 wounds on the medial calf is easier and post debridement appears to have a healthier base. We have referred her to Dr. Migdalia Dk for consideration of operative debridement 12/20/16; we have a quick note from Dr. Merri Ray who feels that the patient needs to be referred to an academic center/plastic surgeon. This  is due to the complexity of the patient's medical issues as it applies to anything in the OR. We have been using Iodoflex 12/27/16; in general the wounds on the right leg are better in terms of the difficult to remove surface slough. She has been using Iodoflex. She is approved for Apligraf which I anticipated ordering in the next week or 2 when we get a better-looking surface 01/04/16 the deep wounds on the right leg generally look better. Both of them are debrided further surface slough. The area on the lateral right leg was not debrided. She is approved for Apligraf I think I'll probably order this either next week or the week after depending on the surface of the wounds superiorly. We have been using Iodoflex which will continue until then 01/11/16; the deep wounds on the  right leg again have a surface slough that requires debridement. I've not been able to get the wound bed on either one of these wounds down to what would be acceptable for an advanced skin stab-like Apligraf. The area on the medial leg has been improving. We have been using hot Iodoflex to all wounds which seems to do the best at at least limiting the nonviable surface 01/24/17; we have continued Iodoflex and all her wound areas. Her debridement Gen. he is easier and the surface underneath this looks viable. Nevertheless these are large area wounds with exposed muscle at least on the anterior parts. We have ordered Apligraf's for 2 weeks from now. The patient will be away next week 02/07/17; the patient was close to have first Apligraf today however we did not order one. I therefore replaced her Iodoflex. She essentially has 3 large punched- out areas on her right anterior leg and right medial ankle. 02/11/17; Apligraf #1 02/25/17; Apligraf #2. In general some improvement in the right medial ankle and right anterior leg wounds. The larger wound medially perhaps some better 03/11/17; Apligraf #3. In general continued improvement in the  right medial ankle and the right anterior leg wounds. 03/25/17 Apligraf #4. In general continued improvement especially on the right medial ankle and the lower 04/08/17; Apligraf #5 in general continued improvement in all wound sites. 04/22/17; post Apligraf #5 her wound beds continued to look a lot better all of them up to the surface of the surrounding skin. Had a caramel-colored slough that I did not debrided in case there is residual Apligraf effect. The wounds are as good as I have seen these looking quite some time. 04/29/17; we applied Watchung and last week after completing her Apligraf. Wounds look as though they've contracted somewhat although they have a nonviable surface which was problematic in the past. Apparently she has been approved for further Apligraf's. We applied Iodosorb today after debridement. 05/06/17; we're fortunate enough today to be able to apply additional Apligraf approved by her insurance. In general all of her wounds look better Apligraf #6 05/24/17; Apligraf #7 continued improvement in all wounds o3 06/10/17 Apligraf #8. Continued improvement in the surface of all wounds. Not much of an improvement in dimensions as I might a follow 06/24/17 Apligraf #9 continued general improvement although not as much change in the wound areas I might of like. She has a new open area on the right anterior lateral ankle very small and superficial. She also has a necrotic wound on the tip of her right index finger probably secondary to severe uncontrolled Raynaud's phenomenon. She is already on sildenafil and already seen her rheumatologist who gave her Keflex. 07/08/17; Apligraf #10. Generalized improvement although she has a small additional wound just medial to the major wound area. 07/22/17; after discussion we decided not to reorder any further Apligraf's although there is been considerable improvement with these it hasn't been recently. The major wound anteriorly looks better. Smaller  wounds beneath this and the more recent one and laterally look about the same. The area on the right lateral lower leg looks about the same 07/29/17 this is a patient who is exceedingly complex. She has advanced scleroderma, crest syndrome including calcinosis, PAD status post revascularization, chronic venous insufficiency status post ablations. She initially presented to this clinic with wounds on her bilateral lower legs however these closed. More recently we have been dealing with a large open area superiorly on the right anterior leg, a smaller wound underneath  this and then another one on the right medial lower extremity. These improve significantly with 10 Apligraf applications. Over the last 2-3 weeks we are making good progress with Hydrofera Blue and these seem to be making progress towards closure 08/19/17; wounds continued to make good improvement with Hydrofera Blue and episodic aggressive debridements 08/26/17; still using Hydrofera Blue. Good improvements 09/09/17; using Hydrofera Blue continued improvement. Area on the lateral part of her right leg has only a small remaining open area. The small inferior satellite region is for all intents and purposes closed. Her major wound also is come in in terms of depth and has advancing epithelialization. 09/16/17; using Hydrofera Blue with continued improvement. The smaller satellite wound. We've closed out today along with a new were satellite wound medially. The area on her medial ankle is still open and her major wound is still open but making improvement. All using Hydrofera Blue. Currently 09/30/17; using Hydrofera Blue. Still a small open area on the lateral right ankle area and her original major wound seems to be making gradual and steady improvement. 10/14/17; still using Hydrofera Blue. Still too small open areas on the right lateral ankle. Her original major wound is horizontal and linear. The most problematic area paradoxically seems to  be the area to the medial area wears I thought it would be the lateral. 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 10/28/17; still using Hydrofera Blue. Right lateral ankle has a very small open area superiorly on the most lateral part of the wound. Her original open wound has 2 open areas now separated by normal skin and we've redefined this. 11/11/17; still using Hydrofera Blue area and right lateral ankle continues to have a small open area on mostly the lateral part of the wound. Her original wound has 2 small open areas now separated by a considerable amount of normal skin 11/28/17; the patient called in slightly before Thanksgiving to report pain and erythema above the wound on the right leg. In the past this is responded well to treatment for cellulitis and I gave her over the phone doxycycline. She stated this resulted in fairly abrupt improvement. We have been using Hydrofera Blue for a prolonged period of time to the larger wound anteriorly into the remaining wound on her right right lateral ankle. The latter is just about closed with only a small linear area and the bottom of the [place]. 12/02/17; use endoform and left the dressing on since last visit. There is no tenderness and no evidence of infection. 12/16/17; patient has been using Endoform but not making much progress. The 2 punched out open areas anteriorly which were the reminiscence of her major wound appear deeper. The area on the lateral aspect of her right calf also appears deeper. Also she has a puzzling tender swelling above her wound on the right leg. This seems larger than last time. Just above her wounds there appears to be some fluctuance in this area it is not erythematous and there is no crepitus 12/30/17; patient has been using Endoform up until last week we used Hydrofera Blue. Ultrasound of the swelling above her 2 major wounds last time was negative for a fluid  collection. I gave her cefaclor for the erythema and tenderness in this area which seems better. Unfortunately both punched out areas anteriorly and the area on her right medial lower leg appear deeper. In fact the lateral of the wounds anteriorly actually looks as though it  has exposed tendon and/or muscle sheath. She is not systemically unwell. She is complaining of vaginitis type symptoms presumably Candida from her antibiotics. 01/06/18; we're using santyl. she has 2 punched out areas anteriorly which were initially part of a large wound. Unfortunately medially this is now open to tendon/muscle. All the wounds have the same adherent very difficult to debride surface. 01/20/18; 2 week follow-up using Santyl. She has the 2 punched out areas anteriorly which were initially part of her large surface wound there. Medially this still has exposed muscle. All of these have the same tightly adherent necrotic surface which requires debridement. PuraPly was not accepted by the patient's insurance however her insurance I think it changed therefore we are going to run Apligraf to gain 02/03/18; the patient has been using Santyl. The wound on the right lateral ankle looks improved and the 2 areas anteriorly on the right leg looks about the same. The medial one has exposed muscle. The lateral 1 requiresdebridement. We use PuraPly today for the first week 02/10/18; PuraPly #2. The patient has 3 wounds. The area on the right lateral ankle, 2 areas anteriorly that were part of her original large wound in this area the medial one has exposed muscle. All of the wounds were lightly debrided with a number 3 curet. PuraPly #2 applied the lateral wound on the calf and the right lateral ankle look better. 02/17/18; PuraPly #3. Patient has 3 wounds. The area on the right lateral ankle in 2 areas internally that were part of her original large wound. The lateral area has exposed muscle. She arrived with some complaints of pain  around the right ankle. 02/24/18; PuraPly #4; not much change in any of the 3 wound areas. Right lateral ankle, right lateral calf. Both of these required debridement with a #3 curet. She tolerates this marginally. The area on the medial leg still has exposed muscle. Not much change in dimensions 03/03/18 PuraPly #5. The area on the medial ankle actually looks better however the 2 separate areas that were original parts of the larger right anterior leg wound look as though they're attempting to coalesce. 03/10/18; PuraPly #6. The area on the medial ankle actually continues to look and measures smaller however the 2 separate areas that were part of the original large wound on the right anterior leg have now coalesced. There hasn't been much improvement here. The lateral area actually has underlying exposed muscle 03/17/18-she is here in follow-up evaluation for ulcerations to the right lower extremity. She is voicing no complaints or concerns. She tolerated debridement. Puraply#7 placed 03/24/18; difficult right lower extremity ulcerations. PuraPly #8 place. She is been approved for [employer]. She did very well with Apligraf today however she is apparently reached her "lifetime max" 03/31/18; marginal improvement with PuraPly although her wounds looked as good as they have in several weeks today. Used TheraSkins #1 04/14/18 TheraSkin #2 today 04/28/18 TheraSkins #3. Wound slightly improved 05/12/18; TheraSkin skin #4. Wound response has been variable. 05/27/18 TheraSkin #5. Generally improvement in all wound areas. I've also put her in 3 layer compression to help with the severe venous hypertension 06/09/18; patient is done quite well with the TheraSkins unfortunately we have no further applications. I also put her in 3 layer compression last week and that really seems to of helped. 06/16/18; we have been using silver collagen. Wounds are smaller. Still be open area to the muscle layer of her calf however even  that is contracted somewhat. She tells me that at  night sometimes she has pain on the right lateral calf at the site of her lower wound. Notable that I put her into 3 layer compression about 3 weeks ago. She states that she dangles her leg over the bed that makes it feel better but she does not describe claudication during the day ooShe is going to call her secondary insurance to see if they will continue to cover advanced treatment products I have reviewed her arterial studies from 01/22/17; this showed an ABI in the right of 1 and on the left noncompressible. TBI on the right at 0.30 on the left at 0.34. It is therefore possible she has significant PAD with medial calcification falsely elevating her ABI into the normal range. I'll need to be careful about asking her about this next week it's possible the 3 layer compression is too much 06/23/18; was able to reapply TheraSkin 1 today. Edema control is good and she is not complaining of pain no claudication 07/07/18;no major change. New wound which was apparently a taper removal injury today in our clinic between her 2 wounds on the right calf TheraSkins #2 07/14/18; I think there is some improvement in the right lateral ankle and the medial part of her wound. There is still exposed muscle medially. 07/28/18; two-week follow-up. TheraSkins#3. Unfortunately no major change. She is not a candidate I don't think for skin grafting due to severe venous hypertension associated with her scleroderma and pulmonary hypertension 08/11/18 Patient is here today for her [employer] application #47 (#5 of the second set). She seems to be doing well and in the base of the wound appears to show some progress at this point. This is the last approved [employer]askin of the second set. 08/25/18; she has completed TheraSkin. There has been some improvement on the right lateral calf wound as well as the anterior leg wounds. The open area to muscle medially on the anterior leg wound is  smaller. I'm going to transition her back to [facility] under Kerlix Coban change every second day. She reports that she had some calcification removed from her right upper arm. We have had previous problems with calcifications in her wounds on her legs but that has not happened recently 09/08/18;using Hydrofera Blue on both her wound areas. Wounds seem to of contracted nicely. She uses Kerlix Coban wrap and changes at home herself 09/22/2018; using Hydrofera Blue on both her wound areas. Dimensions seem to have come down somewhat. There is certainly less depth in the medial part of the mid tibia wound and I do not think there is any exposed muscle at this point. 10/06/2018; 2-week follow-up. Using [facility] on her wound areas. Dimensions have come down nicely both on the right lateral ankle area in the right mid tibial area. She has no new complaints 10/20/2018; 2-week follow-up. She is using Hydrofera Blue. Not much change from the last time she was here. The area on the lateral ankle has less depth although it has raised edges on one side. I attempted to remove as much of the raised edge as I could without creating more additional wounds. The area on the right anterior mid tibia area looks the same. 11/03/2018; 2-week follow-up she is using Hydrofera Blue. On the right anterior leg she now has 2 wounds separated by a large area of normal skin. The area on the medial part still has I think exposed muscle although this area itself is a lot smaller. The area laterally has some depth. Both areas with  necrotic debris. ooThe area on her right lateral ankle has come in nicely 11/17/2018; patient continues to use Hydrofera Blue. We have been increasing separation of the 2 wounds anteriorly which were at one point joint the area on the right lateral calf continues to have I think some improvement in depth. 12/08/2018; patient continues to use Hydrofera Blue. There is some improvement in the area on the  right lateral calf. The 2 areas that were initially part of the original large wound in the mid right tibia are probably about the same. In fact the medial area is probably somewhat larger. We will run puraply through the patient's insurance 12/22/2018; she has been using Hydrofera Blue. We have small wounds on the right lateral calf and 2 small areas that were initially part of a large wound in the right mid tibia. We applied pure apply #1 today. 12/29/2018; we applied puraply #2. Her wounds look somewhat better especially on the right lateral calf and the lateral part of her original wound in the mid tibial area. 01/05/2019; perhaps slightly improved in terms of wound bed condition but certainly not as much improvement as I might of liked. Puraply #3 1/13: we did not have a correct sized puraply to apply. wounds more pinched out looking, I increased her compression to 3 layer last week to help with significant multilevel venous hypertension. Since then I've reviewed her arterial status. She has a right femoral endarterectomy and a distal left SFA stent. She was being followed by Dr. [name] for a period however she does not appear to have seen him in 3 years. I will set up an appointment. 1/20. The patient has an additional wound on the right lateral calf between the distal wound and proximal wounds. We did not have Puraply last week. Still does not have a follow-up with Dr. early 1/27: Follow-up with Dr. early has been arranged apparently with follow-up noninvasive studies. Wounds are measuring roughly the same although they certainly look smaller 2/3; the patient had non invasive studies. Her ABIs on the right were 0.83 and on the left 1.02 however there was no great toe pressure bilaterally. Also worrisome monophasic waveforms at the PTA and dorsalis pedis. We are still using Puraply. We have had some improvement in all of the wounds especially the lateral part of the mid tibial area. 2/10; sees  Dr. early of vein and vascular re-arterial studies next week. Puraply reapplied today. 2/17; Dr. early of vein and vascular his appointment is tomorrow. Puraply reapplied after debridement of all wounds 2/24; the patient saw Dr. early I reviewed his note. sHe noted the previous right femoral endarterectomy with a Dacron patch. He also noted the normal ABI and the monophasic waveforms suggesting tibial disease. Overall he did not feel that she had any evidence of arterial insufficiency that would impair her wound healing. He did note her venous disease as well. He suggested PRN follow-up. 3/2; I had the last puraply applied today. The original wounds over the mid tibia area are improved where is the area on the right lower leg is not 3/9; wounds are smaller especially in the right mid tibia perhaps slightly in the right lateral calf. We finished with puraply and went to endoform today 3/23; the patient arrives after 2 weeks. She has been using endoform. I think all of her wounds look slightly better which includes the area on the right lateral calf just above the right lateral malleolus and the 2 in the right mid tibia which  were initially part of the same wound. 4/27; TELEHEALTH visit; the patient was seen for telehealth visit today with her consent in the middle of the worldwide epidemic. Since she was last here she called in for antibiotics with pain and tenderness around the area on the right medial ankle. I gave her empiric doxycycline. She states this feels better. She is using endoform on both of these areas 5/11 TELEHEALTH; the patient was seen for telehealth visit today. She was accompanied at home by her husband. She has severe pulmonary hypertension accompanied scleroderma and in the face of the Covid epidemic cannot be safely brought into our clinic. We have been using endoform on her wound areas. There are essentially 3 wound areas now in the left mid tibia now 2 open areas that it one  point were connected and one on the right lateral ankle just above the malleolus. 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 She cannot come to clinic. The wounds were reviewed today via telehealth. She has severe chronic venous hypertension which I think is centrally mediated. She has wounds on her right anterior tibia and right lateral ankle area. These are chronic. She has been using endoform. 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 We have been following her from telehealth. She has severe chronic venous hypertension which may be mostly centrally mediated secondary to right heart heart failure. She has wounds on her right anterior tibia and right lateral ankle these are chronic we have been using endoform 6/22; TELEHEALTH; this patient was seen today via telehealth. 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 Since the last time we had contact with this patient she developed some pain and erythema around the wound on her right lateral malleolus/ankle and we put in antibiotics for her. This is resulted in good improvement with resolution of the erythema and tenderness. I changed her to silver alginate last time. We had been using endoform for an extended period of time 7/13; TELEHEALTH; this patient was seen today via telehealth. 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 She would continue to be at a prohibitive risk to be brought into our clinic unless this was absolutely necessary. These visits have been done with her approval as well as her husband. We have been  using silver alginate to the areas on the right mid tibia and right lateral lower leg. 7/27 TELEHEALTH; patient was seen along with her husband today via telehealth. 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 We changed her to sample last visit. She has 2 areas a chronic wound on her right mid tibia and one just above her ankle. These were not the original wounds when she came into this clinic but she developed them during treatment 8/17; she comes in for her first face-to-face visit in a long period. She has a remaining area just medial to the right tibia which is the last open part of her large wound across this area. She also has an area on the right lateral lower leg. We prescribed  Santyl last telehealth visit but they were concerned that this was making a deeper so they put silver alginate on it last week. Her husband changes the dressings. 8/31; using Santyl to the 2 wound areas some improvement in wound surfaces. Husband has surgery in 2 weeks we will put her out 3 weeks. Any of the advanced treatment options that I can think of that would be eligible for this wound would also cause her to have to come in weekly. The risk that the patient is just too high 9/21. Using Santyl to the 2 wound areas. Both of these are somewhat better although the medial mid tibia area still has exposed muscle. Lateral ankle requiring debridement. Using Santyl 10/12; using Santyl to the 2 wound areas. One on the right lateral ankle and the other in the medial calf which still has exposed muscle. Both areas have come down in size and have better looking surfaces. She has made nice progress with santyl 11/2; we have been using Santyl to the 2 wound areas. Right lateral ankle and the other in the mid tibia area the medial part of this still has exposed muscle. 11/23/2019 on evaluation today patient appears to be doing about the same  really with regard to her wounds. She is actually not very pleased with how things seem to be progressing at this point she tells me that she really has not noted much improvement unfortunately. With that being said there is no signs of active infection at this time. There is some slough buildup noted at this time which again along with some dry skin around the edges of the wound I think would benefit her to try to debride some of this away. Fortunately her pain is doing fairly well. She still has exposed muscle in the right medial/tibial area. 12/14; TELEHEALTH; she was changed to [facility] to the right calf wound and right lateral ankle wound when she was here last time. Unfortunately since then she had a fall with a pelvic fracture and a fracture of her wrist. She was apparently hospitalized for 5 days. I have not looked at her discharge summary. She apparently came out of the hospital with a blister on her right heel. She was seen today via telehealth by myself and our case manager. The patient and her husband were present. She has been using Hydrofera Blue at our direction from the last time she was in the clinic. There is been no major improvement in fact the areas appear deeper and with a less viable surface 01/04/2020; TELEHEALTH; the patient was seen today in accompaniment of her husband and our nurse. She has 3 open areas 1 on the right medial mid tibia, one on the right lateral ankle and a large eschared pressure area on the right heel. We have been using Iodoflex to the 2 original wounds. 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 It is simply too perilous for her to be seen in any other way 1/26; TELEHEALTH; the patient was seen today in accompaniment of her husband and our RN one of our nurses. She still has the 3 open areas 1 on the right medial mid tibia which is the remanent of a more extensive wound in this area, one on the right lateral ankle and a  large eschared pressure area on the right heel. We have been using Iodoflex to the 2 original wounds and a bed at 9 application to the eschared area on the heel 2/15; the first  time we have seen this patient and then several months out of concern for the pandemic. She had a large horizontal wound in the mid tibia. Only the medial aspect of this is still open. Area on the lateral ankle is just about closed. She had a new pressure ulcer on the right heel which I have removed some of the eschar. We have been using Iodoflex which I will continue. The area in the mid tibia has a round circle in the middle of exposed muscle. I think we would have to use an advanced treatment product to stimulate granulation over this area. We will run this through her insurance. She is not eligible for plastic surgery for many reasons 3/1; TELEHEALTH. The patient was seen today by telehealth. 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 We have been using Iodoflex to the wound areas which include the right anterior mid tibia, right lateral ankle and the right heel. All of these were reviewed 3/8; the patient's wound just above the right ankle is closed. She still has a contracting black eschar on the heel where she had a pressure ulcer. The medial part of her original wound on the mid tibia has exposed muscle. We have made really made no progress in this area although we have managed to get a lot of the original wound in this area to close. We used Apligraf #1 today 3/22; the patient's ankle wound remains closed. She still has a contracting black eschar on the heel although it seems to have less surface area. It still not clear whether there is any depth here. We used Apligraf #2 today in an attempt to get granulation over the exposed muscle and what is left of her mid tibia area. 4/5; everything is closed except the medial aspect  of the mid tibia wound, as well as the pressure injury on the right heel. She has been using Betadine to the right heel which has been gradually contracting. We used Apligraf #3 today. 4/19; Apligraf #4. Still a pressure area on the right heel she has been using Betadine superficial excoriated areas she has been applying salicylic acid to on the anterior leg and the thick horn on the leg just above her wound area 5/3; Apligraf #5. Unfortunately she came in today with a reopening of the area on the right lateral lower leg. Not much change in the wound we have been treating in the mid calf. Quite a bit of edema surrounding this wound. She reports that she was up on her feet quite a bit. The area on the right heel is separating she has been using Betadine She comes in with a new wound on the left lateral malleolus which is very disappointing this is been open since last week 5/17; we applied her last Apligraf last time. Unfortunately that did not have any effect on the deep area medially in the mid tibial area. However the area over the right lateral malleolus is a lot better. Right heel also is contracted we used Iodoflex last time. The new wound on the left lateral malleolus were using Santyl to. This required debridement. 6/1. Not much change in the area on the right medial mid tibia. Right lateral ankle is improved she has the necrotic area on the right heel which was a pressure area. New area on the left lateral malleolus from last time. I changed her to Iodoflex to the area on the  left lateral malleolus she said this hurt I have put her back on Santyl 6/15; right mid tibia is unchanged. I do not think there is anything I can do to this topically that we will get this to granulate over the muscle. And I am furthermore I am not sure that she is a surgical candidate either because of her severe pulmonary issues or because she really does not want to go through it. ooThe area on the right lateral  malleolus is a small punched out area. ooThe area on the left lateral malleolus again small punched out with nonviable surface ooPressure ulcer on the right heel at separating black eschar. I went ahead and remove this today [name] [name] has an area on the left anterior mid tibia just laterally. Geographic wounds debris on the surface. Some erythema here. 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 There is skin changes widely Change the primary dressing to Sorbact to all wound areas. She needs compression on the left leg as well as the right 6/21; right mid tibia which was her one remaining wound at 1 point is unchanged ooThe area on the right lateral malleolus actually is close to closing over still a small punched out area ooThe area on the left lateral malleolus again a deep wound it is this 1 that she feels pain in. ooPressure ulcer on the right heel was cultured today. ooNew area on the left anterior mid tibia several geographic wounds last week in the setting of chronic venous insufficiency this actually looks some better 7/6; ooRight mid tibia about the same. Exposed muscle ooLeft lateral malleolus again necrotic debris over the surface. ooPressure ulcer on the right heel. She finished the Keflex we prescribed. Necrotic debris debrided with a #3 curette ooThe rest of her wounds on the right mid tibia right lateral ankle are better Her husband reminds me that she had a right femoral endarterectomy and has 2 stents in her left thigh placed in 2011 approximately by vascular surgery in [place]. She saw Dr. [name] last in February 2020. At that point he did not think that the arterial insufficiency she had on the right was sufficient to explain any of her right lower extremity wounds however she now has an area on the left lateral malleolus that looks like an ischemic wound 7/12; we have been using Sorbact all wound areas ooRight mid tibia about the same exposed  muscle ooLeft lateral malleolus small wound with debris in the surface punched out ooPressure ulcer of the right heel requiring debridement of necrotic debris ooLateral ankle wound on the right is just about closed ooRight mid tibia wound about the same still with exposed muscle. 7/26 really no change in any wounds. We have been using Sorbact. She has bilateral lower extremity wounds. This is in the setting of scleroderma, severe venous hypertension. She also has known PAD and I had really hoped to be able to get a review by Dr. [name] but apparently that is not booked until August 31. I have asked her to call back to vein and vascular and get an appointment with somebody a little earlier if that is possible. 8/16; patient's area on the right ankle which was a chronic wound is closed over again. The area on the right mid lower leg, right heel left lateral ankle are all about the same. Pressure ulcer on the right heel is still not viable. New areas identified on the right lateral calf. She has a follow-up with Dr. [name] on  8/31. Phenomenally I would like him to go over her arterial status with regards to her underlying stents. 9/13; patient had her ARTERIAL studies done however apparently Dr. [name] is now in [place] so she did not get an appointment with him on 8/31 and was not referred to any other provider. On the right side her PTA was monophasic. Noncompressible. TBI on the right at 0.41 on the left she was monophasic and noncompressible they did not do a TBI because she had a Band-Aid on her big toe. Patient does not describe claudication although she is having a lot of pain in the left lateral malleolus. 10/11; the patient had a right-sided interventions on 10/5. She had a angioplasty of the right anterior tibial artery as well as the tibioperoneal trunk/peroneal artery she had a drug-coated balloon angioplasty of the right superficial femoral artery. On the left she did not have any  interventions yet but is going to have another look at this in 2 weeks. The superficial femoral artery on the left and the associated stent are patent popliteal artery is patent to the dominant vessel runoff is the anterior tibial which has several high-grade stenosis in the proximal one third. We have been using Sorbact. She also has a new area on the right upper mid tibia. 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 She tolerated this well. She is using Iodoflex and her husband is doing a kerlix Coban on her. She arrives in clinic today with the original medial part of her mid tibia wound with muscle exposure small area on the right ankle which was part of her original wound she has 2 punched-out areas laterally and 1 above her area anteriorly. Nonviable surfaces similarly the right heel is nonviable. On the left side she has an area on the left foot and the left lateral ankle similar nonviable surfaces. 11/8; patient arrives in clinic today with nothing improved. She has a multiple lightly group of wounds on the right lower leg presumably different etiologies including a skin biopsy, the original wound she had medially and some punched-out areas that are not of clear defined etiology. She says she was in a lot of pain this week could not have her leg up on the bed did not get completely relief from dropping it over the bed. She also has a small area on the left lateral malleolus and the left lateral foot necrotic surfaces on all of these wounds. We have been using Iodoflex I attempted to put her in 3 layer compression last week that did not go well. We will be backing off on kerlix Coban this week. 11/15; the ultrasound that I sent the patient for  last week showed findings consistent with acute deep vein thrombosis involving the right distal femoral vein and right popliteal vein. I was concerned about the combination of Plavix and Xarelto however apparently Dr. [name] felt that was not a problem. She is now going through the acute dosing of Xarelto. She has an appointment with Dr. Trula Slade on 12/5. 11/29; 10 days ago before he left for weeks vacation she called to report pain in the right leg I sent her in some doxycycline her husband thought things were getting better however she required admission to hospital from 11/22 through 11/24  prompted by finding that her hemoglobin was 5.7 she was transfused 2 units of packed cells and her hemoglobin stabilized. She did undergo an endoscopy with findings of significant esophagitis part of which was candidal and part of it reflux. She was discharged on nystatin swish and swallow which she is doing. She seems to be better from that regard. There was no active other active source of bleeding Unfortunately the combination of Xarelto and Plavix was felt to be too much for her. The Xarelto was stopped and an IVC filter was placed she is continued on Plavix. Her big complaint today is pain in the right calf really from the mid aspect superiorly. This is very tender slightly red but not particularly warm. She did see vein and vascular in the hospital I will see if I can pull their note 12/6; patient was at Dr. Stephens Shire office today. Great toe pressure on the right is 54 on the left 49 she has a 50 to 74% stenosis of the right mid popliteal on the left at 30 to 49% proximal SFA and a 50 to 74% CFA. She was treated with angioplasty of the right ATA, right TP trunk and peroneal artery as well as an angioplasty of the right SFA to popliteal artery. On 10/26 she underwent angioplasty of the left ATA and left TP trunk she has a history of a left common femoral endarterectomy and left superficial femoral artery  stent as well as a right superficial femoral artery stent. In addition to her arterial disease she has significant venous disease with a history of a right greater saphenous vein ablation and a recent DVT in the right femoral and popliteal vein with insertion of an IVC filter. She has remained on Plavix but her anticoagulant was stopped. She continues to have a lot of swelling and pain in the right leg with significant erythema which I am assuming is stasis dermatitis. She has had substantial wound areas on the right leg including right lateral a large necrotic wound with a smaller biopsy site above this. She has original wound on the left mid tibia with a small area above this as well. On the left she has a small area over the left lateral malleolus and the left lateral foot 12/22-week follow-up. Her right leg looks really terrible. She has another necrotic wound just above her original one on the right posterior leg. Greenish drainage suggestive of gram-negative/Pseudomonas infection. The entire mid part of her leg is erythematous I think most of this is stasis dermatitis rather than cellulitis. She has also a bothersome ischemic-looking right fourth toe which I noticed today without her really complaining. She has not been systemically unwell although she looks more physically for Objective Constitutional Patient is hypertensive.. Pulse regular and within target range for patient.Marland Kitchen Respirations regular, non-labored and within target range.. Temperature is normal and within the target range for the patient.Marland Kitchen Appears in no distress. Vitals Time Taken: 2:43 PM, Height: 68 in, Weight: 132 lbs, BMI: 20.1, Temperature: 98.7 F, Pulse: 92 bpm, Respiratory Rate: 17 breaths/min, Blood Pressure: 184/63 mmHg. General Notes: Wound exam ooThe patient has sizable necrotic wounds on the right posterior lateral leg including a new one above the area. Both of these are green with necrotic material on the  surface. I did a deep tissue culture from the original area. She has new areas just above this. Her original wounds appear stable ooOn the left lateral leg there is a new wound as well punched-out areas over the  left lateral malleolus and left lateral foot. All of these with nonviable surfaces Integumentary (Hair, Skin) Wound #16 status is Open. Original cause of wound was Pressure Injury. The wound is located on the Right Calcaneus. The wound measures 0.5cm length x 1.9cm width x 0.6cm depth; 0.746cm^2 area and 0.448cm^3 volume. There is Fat Layer (Subcutaneous Tissue) exposed. There is no tunneling or undermining noted. There is a small amount of serosanguineous drainage noted. The wound margin is epibole. There is medium (34-66%) pink, pale granulation within the wound bed. There is a small (1-33%) amount of necrotic tissue within the wound bed including Adherent Slough. Wound #19 status is Open. Original cause of wound was Gradually Appeared. The wound is located on the Right,Lateral Malleolus. The wound measures 2.2cm length x 0.7cm width x 0.2cm depth; 1.21cm^2 area and 0.242cm^3 volume. There is Fat Layer (Subcutaneous Tissue) exposed. There is no tunneling or undermining noted. There is a small amount of serous drainage noted. The wound margin is distinct with the outline attached to the wound base. There is medium (34-66%) pink granulation within the wound bed. There is a medium (34-66%) amount of necrotic tissue within the wound bed including Adherent Slough. Wound #20 status is Open. Original cause of wound was Gradually Appeared. The wound is located on the Left,Lateral Malleolus. The wound measures 1cm length x 0.8cm width x 0.5cm depth; 0.628cm^2 area and 0.314cm^3 volume. There is Fat Layer (Subcutaneous Tissue) exposed. There is no tunneling or undermining noted. There is a small amount of purulent drainage noted. The wound margin is distinct with the outline attached to the wound  base. There is no granulation within the wound bed. There is a large (67-100%) amount of necrotic tissue within the wound bed including Adherent Slough. Wound #23 status is Open. Original cause of wound was Gradually Appeared. The wound is located on the Right,Posterior Lower Leg. The wound measures 10cm length x 5cm width x 0.9cm depth; 39.27cm^2 area and 35.343cm^3 volume. There is Fat Layer (Subcutaneous Tissue) exposed. There is no tunneling or undermining noted. There is a large amount of purulent drainage noted. The wound margin is distinct with the outline attached to the wound base. There is no granulation within the wound bed. There is a large (67-100%) amount of necrotic tissue within the wound bed including Adherent Slough. Wound #24 status is Open. Original cause of wound was Gradually Appeared. The wound is located on the Right,Anterior Lower Leg. The wound measures 0.9cm length x 0.5cm width x 0.2cm depth; 0.353cm^2 area and 0.071cm^3 volume. There is Fat Layer (Subcutaneous Tissue) exposed. There is no tunneling or undermining noted. There is a large amount of purulent drainage noted. The wound margin is distinct with the outline attached to the wound base. There is no granulation within the wound bed. There is a large (67-100%) amount of necrotic tissue within the wound bed including Adherent Slough. Wound #25 status is Open. Original cause of wound was Gradually Appeared. The wound is located on the Left,Lateral Foot. The wound measures 0.7cm length x 0.5cm width x 0.4cm depth; 0.275cm^2 area and 0.11cm^3 volume. There is Fat Layer (Subcutaneous Tissue) exposed. There is no tunneling or undermining noted. There is a large amount of serous drainage noted. The wound margin is distinct with the outline attached to the wound base. There is no granulation within the wound bed. There is a large (67-100%) amount of necrotic tissue within the wound bed including Adherent Slough. Wound #26  status is Open. Original cause  of wound was Gradually Appeared. The wound is located on the Right,Lateral Lower Leg. The wound measures 15cm length x 4cm width x 0.2cm depth; 47.124cm^2 area and 9.425cm^3 volume. There is Fat Layer (Subcutaneous Tissue) exposed. There is no tunneling or undermining noted. There is a large amount of purulent drainage noted. The wound margin is flat and intact. There is no granulation within the wound bed. There is a large (67-100%) amount of necrotic tissue within the wound bed including Adherent Slough. Wound #27 status is Open. Original cause of wound was Gradually Appeared. The wound is located on the Right,Distal,Anterior Lower Leg. The wound measures 0.7cm length x 0.5cm width x 0.4cm depth; 0.275cm^2 area and 0.11cm^3 volume. There is Fat Layer (Subcutaneous Tissue) exposed. There is no tunneling or undermining noted. There is a large amount of purulent drainage noted. The wound margin is flat and intact. There is no granulation within the wound bed. There is a large (67-100%) amount of necrotic tissue within the wound bed including Adherent Slough. Wound #28 status is Open. Original cause of wound was Trauma. The wound is located on the Left,Lateral Lower Leg. The wound measures 0.9cm length x 0.7cm width x 0.1cm depth; 0.495cm^2 area and 0.049cm^3 volume. There is no tunneling or undermining noted. There is a medium amount of serosanguineous drainage noted. The wound margin is distinct with the outline attached to the wound base. There is small (1-33%) red, pink granulation within the wound bed. There is a large (67-100%) amount of necrotic tissue within the wound bed including Adherent Slough. Wound #29 status is Open. Original cause of wound was Gradually Appeared. The wound is located on the Right,Proximal,Lateral Lower Leg. The wound measures 1.5cm length x 1cm width x 0.6cm depth; 1.178cm^2 area and 0.707cm^3 volume. There is no tunneling or undermining  noted. There is a large amount of purulent drainage noted. The wound margin is distinct with the outline attached to the wound base. There is no granulation within the wound bed. There is a large (67-100%) amount of necrotic tissue within the wound bed including Adherent Slough. Wound #5 status is Open. Original cause of wound was Gradually Appeared. The wound is located on the Right,Medial Lower Leg. The wound measures 2.5cm length x 2.2cm width x 0.2cm depth; 4.32cm^2 area and 0.864cm^3 volume. There is Fat Layer (Subcutaneous Tissue) exposed. There is a large amount of purulent drainage noted. The wound margin is distinct with the outline attached to the wound base. There is medium (34-66%) pink granulation within the wound bed. There is a medium (34-66%) amount of necrotic tissue within the wound bed including Adherent Slough. 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 There was a Chemical/Enzymatic/Mechanical debridement performed by Deon Pilling,  RN.. Agent used was [employer]. A time out was conducted at 15:00,  prior to the start of the procedure. There was no bleeding. The procedure was tolerated well. Post Debridement Measurements: 1cm length x 0.8cm width x 0.5cm depth; 0.314cm^3 volume. Character of Wound/Ulcer Post Debridement requires further debridement. Severity of Tissue Post Debridement is: Fat layer exposed. 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 There was a Chemical/Enzymatic/Mechanical debridement performed by Deon Pilling, RN.Marland Kitchen Agent used was [employer]. A time out was conducted at 15:00, prior to the start of the procedure. There was no bleeding. The procedure was tolerated well. Post Debridement Measurements: 0.7cm length x 0.5cm width x 0.4cm depth; 0.11cm^3 volume. Character of Wound/Ulcer Post Debridement requires further debridement. Severity of Tissue Post Debridement is: Fat layer exposed. 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 There was a Chemical/Enzymatic/Mechanical debridement performed by Deon Pilling, RN.Marland Kitchen Agent used was [employer]. A time out was conducted at 15:00, prior to the start of the procedure. There was no bleeding. The procedure was tolerated well. Post Debridement Measurements: 0.9cm length x 0.7cm width x 0.1cm depth; 0.049cm^3 volume. Character of Wound/Ulcer Post Debridement requires further debridement. Severity of Tissue Post Debridement is: Fat layer exposed. Post procedure Diagnosis Wound #28: Same as Pre-Procedure Plan Follow-up Appointments: Return Appointment in 1 week. Nurse Visit: - Thursday for rewrap Bathing/ Shower/ Hygiene: May shower and wash wound with soap and water. - on days  that dressing is changed Edema Control - Lymphedema / SCD / Other: Elevate legs to the level of the heart or above for 30 minutes daily and/or when sitting, a frequency of: - throughout the day Avoid standing for long periods of time. Laboratory ordered were: Anerobic culture - Right posterior lower leg The following medication(s) was prescribed: cefdinir oral 300 mg capsule 1 1 capsule oral 2 times a day x 7 days starting 12/19/2020 WOUND #16: - Calcaneus Wound Laterality: Right Cleanser: Soap and Water 2 x Per Week/7 Days Discharge Instructions: May shower and wash wound with dial antibacterial soap and water prior to dressing change. Peri-Wound Care: Sween Lotion (Moisturizing lotion) 2 x Per Week/7 Days Discharge Instructions: Apply moisturizing lotion as directed Prim Dressing: IODOFLEX 0.9% Cadexomer Iodine Pad 4x6 cm 2 x Per Week/7 Days ary Discharge Instructions: Apply Iodoflex or Iodosorb to wound bed as instructed Secondary Dressing: Woven Gauze Sponge, Non-Sterile 4x4 in 2 x Per Week/7 Days Discharge Instructions: Apply over primary dressing as directed. Secondary Dressing: ABD Pad, 8x10 2 x Per Week/7 Days Discharge Instructions: Apply over primary dressing as directed. Com pression Wrap: Kerlix Roll 4.5x3.1 (in/yd) 2 x Per Week/7 Days Discharge Instructions: Apply Kerlix and Coban compression as directed. Com pression Wrap: Coban Self-Adherent Wrap 4x5 (in/yd) 2 x Per Week/7 Days Discharge Instructions: Apply over Kerlix as directed. WOUND #19: - Malleolus Wound Laterality: Right, Lateral Cleanser: Soap and Water 2 x Per Week/7 Days Discharge Instructions: May shower and wash wound with dial antibacterial soap and water prior to dressing change. Peri-Wound Care: Sween Lotion (Moisturizing lotion) 2 x Per Week/7 Days Discharge Instructions: Apply moisturizing lotion as directed Prim Dressing: IODOFLEX 0.9% Cadexomer Iodine Pad 4x6 cm 2 x Per Week/7 Days ary  Discharge  Instructions: Apply Iodoflex or Iodosorb to wound bed as instructed Secondary Dressing: Woven Gauze Sponge, Non-Sterile 4x4 in 2 x Per Week/7 Days Discharge Instructions: Apply over primary dressing as directed. Secondary Dressing: ABD Pad, 8x10 2 x Per Week/7 Days Discharge Instructions: Apply over primary dressing as directed. Com pression Wrap: Kerlix Roll 4.5x3.1 (in/yd) 2 x Per Week/7 Days Discharge Instructions: Apply Kerlix and Coban compression as directed. Com pression Wrap: Coban Self-Adherent Wrap 4x5 (in/yd) 2 x Per Week/7 Days Discharge Instructions: Apply over Kerlix as directed. WOUND #20: - Malleolus Wound Laterality: Left, Lateral Cleanser: Soap and Water Every Other Day/15 Days Discharge Instructions: May shower and wash wound with dial antibacterial soap and water prior to dressing change. Peri-Wound Care: Sween Lotion (Moisturizing lotion) Every Other Day/15 Days Discharge Instructions: Apply moisturizing lotion as directed Prim Dressing: Santyl Ointment Every Other Day/15 Days ary Discharge Instructions: Apply to wound bed as instructed Secondary Dressing: Woven Gauze Sponge, Non-Sterile 4x4 in Every Other Day/15 Days Discharge Instructions: Apply over primary dressing as directed. Secondary Dressing: ABD Pad, 8x10 Every Other Day/15 Days Discharge Instructions: Apply over primary dressing as directed. Com pression Wrap: Kerlix Roll 4.5x3.1 (in/yd) (Generic) Every Other Day/15 Days Discharge Instructions: Apply Kerlix and Coban compression as directed. Com pression Wrap: Coban Self-Adherent Wrap 4x5 (in/yd) (Generic) Every Other Day/15 Days Discharge Instructions: Apply over Kerlix as directed. WOUND #23: - Lower Leg Wound Laterality: Right, Posterior Cleanser: Soap and Water 2 x Per Week/7 Days Discharge Instructions: May shower and wash wound with dial antibacterial soap and water prior to dressing change. Peri-Wound Care: Sween Lotion (Moisturizing lotion) 2 x Per  Week/7 Days Discharge Instructions: Apply moisturizing lotion as directed Topical: Gentamicin 2 x Per Week/7 Days Discharge Instructions: Under Iodoflex Prim Dressing: IODOFLEX 0.9% Cadexomer Iodine Pad 4x6 cm 2 x Per Week/7 Days ary Discharge Instructions: Apply Iodoflex or Iodosorb to wound bed as instructed Secondary Dressing: Woven Gauze Sponge, Non-Sterile 4x4 in 2 x Per Week/7 Days Discharge Instructions: Apply over primary dressing as directed. Secondary Dressing: ABD Pad, 8x10 2 x Per Week/7 Days Discharge Instructions: Apply over primary dressing as directed. Com pression Wrap: Kerlix Roll 4.5x3.1 (in/yd) 2 x Per Week/7 Days Discharge Instructions: Apply Kerlix and Coban compression as directed. Com pression Wrap: Coban Self-Adherent Wrap 4x5 (in/yd) 2 x Per Week/7 Days Discharge Instructions: Apply over Kerlix as directed. WOUND #24: - Lower Leg Wound Laterality: Right, Anterior Cleanser: Soap and Water 2 x Per Week/7 Days Discharge Instructions: May shower and wash wound with dial antibacterial soap and water prior to dressing change. Peri-Wound Care: Sween Lotion (Moisturizing lotion) 2 x Per Week/7 Days Discharge Instructions: Apply moisturizing lotion as directed Prim Dressing: IODOFLEX 0.9% Cadexomer Iodine Pad 4x6 cm 2 x Per Week/7 Days ary Discharge Instructions: Apply Iodoflex or Iodosorb to wound bed as instructed Secondary Dressing: Woven Gauze Sponge, Non-Sterile 4x4 in 2 x Per Week/7 Days Discharge Instructions: Apply over primary dressing as directed. Secondary Dressing: ABD Pad, 8x10 2 x Per Week/7 Days Discharge Instructions: Apply over primary dressing as directed. Com pression Wrap: Kerlix Roll 4.5x3.1 (in/yd) 2 x Per Week/7 Days Discharge Instructions: Apply Kerlix and Coban compression as directed. Com pression Wrap: Coban Self-Adherent Wrap 4x5 (in/yd) 2 x Per Week/7 Days Discharge Instructions: Apply over Kerlix as directed. WOUND #25: - Foot Wound  Laterality: Left, Lateral Cleanser: Soap and Water Every Other Day/15 Days Discharge Instructions: May shower and wash wound with dial antibacterial soap and water prior to dressing  change. Peri-Wound Care: Sween Lotion (Moisturizing lotion) Every Other Day/15 Days Discharge Instructions: Apply moisturizing lotion as directed Prim Dressing: Santyl Ointment Every Other Day/15 Days ary Discharge Instructions: Apply to wound bed as instructed Secondary Dressing: Woven Gauze Sponge, Non-Sterile 4x4 in Every Other Day/15 Days Discharge Instructions: Apply over primary dressing as directed. Secondary Dressing: ABD Pad, 8x10 Every Other Day/15 Days Discharge Instructions: Apply over primary dressing as directed. Com pression Wrap: Kerlix Roll 4.5x3.1 (in/yd) Every Other Day/15 Days Discharge Instructions: Apply Kerlix and Coban compression as directed. Com pression Wrap: Coban Self-Adherent Wrap 4x5 (in/yd) Every Other Day/15 Days Discharge Instructions: Apply over Kerlix as directed. WOUND #26: - Lower Leg Wound Laterality: Right, Lateral Cleanser: Soap and Water 2 x Per Week/7 Days Discharge Instructions: May shower and wash wound with dial antibacterial soap and water prior to dressing change. Peri-Wound Care: Sween Lotion (Moisturizing lotion) 2 x Per Week/7 Days Discharge Instructions: Apply moisturizing lotion as directed Prim Dressing: IODOFLEX 0.9% Cadexomer Iodine Pad 4x6 cm 2 x Per Week/7 Days ary Discharge Instructions: Apply Iodoflex or Iodosorb to wound bed as instructed Secondary Dressing: Woven Gauze Sponge, Non-Sterile 4x4 in 2 x Per Week/7 Days Discharge Instructions: Apply over primary dressing as directed. Secondary Dressing: ABD Pad, 8x10 2 x Per Week/7 Days Discharge Instructions: Apply over primary dressing as directed. Com pression Wrap: Kerlix Roll 4.5x3.1 (in/yd) 2 x Per Week/7 Days Discharge Instructions: Apply Kerlix and Coban compression as directed. Com pression  Wrap: Coban Self-Adherent Wrap 4x5 (in/yd) 2 x Per Week/7 Days Discharge Instructions: Apply over Kerlix as directed. WOUND #27: - Lower Leg Wound Laterality: Right, Anterior, Distal Cleanser: Soap and Water 2 x Per Week/7 Days Discharge Instructions: May shower and wash wound with dial antibacterial soap and water prior to dressing change. Peri-Wound Care: Sween Lotion (Moisturizing lotion) 2 x Per Week/7 Days Discharge Instructions: Apply moisturizing lotion as directed Prim Dressing: IODOFLEX 0.9% Cadexomer Iodine Pad 4x6 cm 2 x Per Week/7 Days ary Discharge Instructions: Apply Iodoflex or Iodosorb to wound bed as instructed Secondary Dressing: Woven Gauze Sponge, Non-Sterile 4x4 in 2 x Per Week/7 Days Discharge Instructions: Apply over primary dressing as directed. Secondary Dressing: ABD Pad, 8x10 2 x Per Week/7 Days Discharge Instructions: Apply over primary dressing as directed. Com pression Wrap: Kerlix Roll 4.5x3.1 (in/yd) 2 x Per Week/7 Days Discharge Instructions: Apply Kerlix and Coban compression as directed. Com pression Wrap: Coban Self-Adherent Wrap 4x5 (in/yd) 2 x Per Week/7 Days Discharge Instructions: Apply over Kerlix as directed. WOUND #29: - Lower Leg Wound Laterality: Right, Lateral, Proximal Cleanser: Soap and Water 2 x Per Week/7 Days Discharge Instructions: May shower and wash wound with dial antibacterial soap and water prior to dressing change. Peri-Wound Care: Sween Lotion (Moisturizing lotion) 2 x Per Week/7 Days Discharge Instructions: Apply moisturizing lotion as directed Prim Dressing: IODOFLEX 0.9% Cadexomer Iodine Pad 4x6 cm 2 x Per Week/7 Days ary Discharge Instructions: Apply Iodoflex or Iodosorb to wound bed as instructed Secondary Dressing: Woven Gauze Sponge, Non-Sterile 4x4 in 2 x Per Week/7 Days Discharge Instructions: Apply over primary dressing as directed. Secondary Dressing: ABD Pad, 8x10 2 x Per Week/7 Days Discharge Instructions: Apply  over primary dressing as directed. Com pression Wrap: Kerlix Roll 4.5x3.1 (in/yd) 2 x Per Week/7 Days Discharge Instructions: Apply Kerlix and Coban compression as directed. Com pression Wrap: Coban Self-Adherent Wrap 4x5 (in/yd) 2 x Per Week/7 Days Discharge Instructions: Apply over Kerlix as directed. WOUND #5: - Lower Leg Wound Laterality: Right,  Medial Cleanser: Soap and Water 2 x Per Week/7 Days Discharge Instructions: May shower and wash wound with dial antibacterial soap and water prior to dressing change. Peri-Wound Care: Sween Lotion (Moisturizing lotion) 2 x Per Week/7 Days Discharge Instructions: Apply moisturizing lotion as directed Prim Dressing: IODOFLEX 0.9% Cadexomer Iodine Pad 4x6 cm 2 x Per Week/7 Days ary Discharge Instructions: Apply Iodoflex or Iodosorb to wound bed as instructed Secondary Dressing: Woven Gauze Sponge, Non-Sterile 4x4 in 2 x Per Week/7 Days Discharge Instructions: Apply over primary dressing as directed. Secondary Dressing: ABD Pad, 8x10 2 x Per Week/7 Days Discharge Instructions: Apply over primary dressing as directed. Com pression Wrap: Kerlix Roll 4.5x3.1 (in/yd) 2 x Per Week/7 Days Discharge Instructions: Apply Kerlix and Coban compression as directed. Com pression Wrap: Coban Self-Adherent Wrap 4x5 (in/yd) 2 x Per Week/7 Days Discharge Instructions: Apply over Kerlix as directed. #1 I did a deep tissue culture of her original necrotic wound on the right. 2. 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 I gave her empiric cefdinir [allergic to quinolones] 3. The intense erythema in the mid part of her right leg is nontender and I suspect this relates to stasis dermatitis. She had a recent acute DVT but is not on anticoagulation because of anemia. 4. Revascularized but her right fourth toe looked dusky and I wonder if this is on the way to dry gangrene. 5. I put her in kerlix Coban today wanting to take control of the dressing. Her husband will  continue to use Santyl on the left leg we will use Iodoflex on the right 6. 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 I will reach out to Dr. Trula Slade to see if he will look at the arterial status form Electronic Signature(s) Signed: 12/19/2020 7:23:52 PM By: Linton Ham MD Entered By: Linton Ham on 12/19/2020 17:46:26 -------------------------------------------------------------------------------- SuperBill Details Patient Name: Date of Service: [name] [name] 12/19/2020 Medical Record Number: [id number] Patient Account Number: [id number] Date of Birth/Sex: Treating RN: [id number] (69 y.o. 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 I87.331 Chronic venous hypertension (idiopathic) with ulcer and inflammation of right lower extremity L89.616 Pressure-induced deep tissue damage of right heel L97.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 L97.528 Non-pressure chronic ulcer of other part of left foot with other specified severity I70.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 I70.248 Atherosclerosis of native arteries of left leg with ulceration of other part of lower leg Facility Procedures CPT4 Code: [id number] Description: 70177 - DEBRIDE W/O ANES NON SELECT Modifier: Quantity: 1 Physician Procedures : CPT4 Code Description Modifier [id number] - WC PHYS  LEVEL 4 - EST PT ICD-10 Diagnosis Description L97.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 I70.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  other part of lower leg I87.331 Chronic venous hypertension (idiopathic) with ulcer and inflammation of right lower extremity Quantity: 1 Electronic Signature(s) Signed: 12/19/2020 7:23:52 PM By: Linton Ham MD Previous Signature: 12/19/2020  5:38:15 PM Version By: Levan Hurst RN, BSN Entered By: Linton Ham on 12/19/2020 17:46:55

## 2020-12-20 ENCOUNTER — Other Ambulatory Visit (HOSPITAL_COMMUNITY)
Admission: RE | Admit: 2020-12-20 | Discharge: 2020-12-20 | Disposition: A | Discharge status: Home / Self Care | Payer: Medicare Other | Source: Other Acute Inpatient Hospital | Attending: Internal Medicine | Admitting: Internal Medicine

## 2020-12-20 DIAGNOSIS — L97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: Secondary | ICD-10-CM | POA: Diagnosis not present

## 2020-12-21 NOTE — Progress Notes (Signed)
[name], [name] ([id number]) Visit Report for 12/19/2020 Arrival Information Details Patient Name: Date of Service: [name] 12/19/2020 2:00 PM Medical Record Number: [id number] Patient Account Number: [id number] Date of Birth/Sex: Treating RN: January 22, 1951 (69 y.o. [name] [name] Primary Care Chancy Claros: [name] Other Clinician: Referring Lorriann Hansmann: Treating Rukaya Kleinschmidt/Extender: [name] in Treatment: 50 Visit Information History Since Last Visit Added or deleted any medications: No Patient Arrived: Wheel Chair Any new allergies or adverse reactions: No Arrival Time: 14:40 Had a fall or experienced change in Yes Accompanied By: self activities of daily living that may affect Transfer Assistance: None risk of falls: Patient Identification Verified: Yes Signs or symptoms of abuse/neglect since last visito No Secondary Verification Process Completed: Yes Hospitalized since last visit: No Patient Requires Transmission-Based Precautions: No Implantable device outside of the clinic excluding No Patient Has Alerts: Yes cellular tissue based products placed in the center Patient Alerts: R ABI: [place] TBI: 0.41 since last visit: L ABI: [place] (07/2020) Has Dressing in Place as Prescribed: Yes Pain Present Now: No Electronic Signature(s) Signed: 12/21/2020 5:16:00 PM By: Rhae Hammock RN Entered By: Rhae Hammock on 12/19/2020 14:42:05 -------------------------------------------------------------------------------- Lower Extremity Assessment Details Patient Name: Date of Service: [name], [name] 12/19/2020 2:00 PM Medical Record Number: [id number] Patient Account Number: [id number] Date of Birth/Sex: Treating RN: Apr 02, 1951 (69 y.o. [name] [name] Primary Care Wilbert Schouten: [name] Other Clinician: Referring Xue Low: Treating Cortavious Nix/Extender: [name] in Treatment: 456 Edema Assessment Assessed:  [name]: No] [name]: No] Edema: [Left: Yes] [Right: Yes] Calf Left: Right: Point of Measurement: 36 cm From Medial Instep 31.5 cm 38 cm Ankle Left: Right: Point of Measurement: 10 cm From Medial Instep 22 cm 22 cm Vascular Assessment Pulses: Dorsalis Pedis Palpable: [Left:Yes] [Right:Yes] Posterior Tibial Palpable: [Left:Yes] [Right:Yes] Electronic Signature(s) Signed: 12/21/2020 5:16:00 PM By: Rhae Hammock RN Entered By: Rhae Hammock on 12/19/2020 15:05:29 -------------------------------------------------------------------------------- Multi Wound Chart Details Patient Name: Date of Service: [name] 12/19/2020 2:00 PM Medical Record Number: [id number] Patient Account Number: [id number] Date of Birth/Sex: Treating RN: April 26, 1951 (69 y.o. 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] Primary Etiology: [16:Anemia, Hypertension, Peripheral] [19:Anemia, Hypertension, Peripheral] [20:Anemia, Hypertension, Peripheral] Comorbid History: [16:Arterial Disease, Peripheral Venous Arterial Disease, Peripheral Venous Arterial Disease, Peripheral Venous Disease, Raynauds, Scleroderma, Rheumatoid Arthritis, OsteoarthritisRheumatoid Arthritis, Osteoarthritis Rheumatoid  Arthritis, Osteoarthritis 01/04/2020] [19:Disease, Raynauds, Scleroderma, Disease, Raynauds, Scleroderma, 05/02/2020]  [20:05/02/2020] Date Acquired: [16:50] [19:33] [20:33] Weeks of Treatment: [16:Open] [19:Open] [20:Open] Wound Status: [16:No] [19:No] [20:No] Clustered Wound: [16:N/A] [19:N/A] [20:N/A] Clustered Quantity: [16:0.5x1.9x0.6] [19:2.2x0.7x0.2] [20:1x0.8x0.5] Measurements L x W x D (cm) [16:0.746] [19:1.21] [20:0.628] A (cm) : rea [16:0.448] [19:0.242] [20:0.314] Volume (cm) : [16:94.10%] [19:-758.20%] [20:-42.70%] % Reduction in A [16:rea: 64.40%] [19:-764.30%] [20:-256.80%] % Reduction in Volume: [16:Category/Stage III] [19:Full Thickness Without Exposed] [20:Full Thickness Without Exposed] Classification: [16:Small] [19:Support Structures Small] [20:Support Structures Small] Exudate A mount: [16:Serosanguineous] [19:Serous] [20:Purulent] Exudate Type: [16:red, brown] [19:amber] [20:yellow, brown, green] Exudate Color: [16:Epibole] [19:Distinct, outline attached] [20:Distinct, outline attached] Wound Margin: [16:Medium (34-66%)] [19:Medium (  34-66%)] [20:None Present (0%)] Granulation A mount: [16:Pink, Pale] [19:Pink] [20:N/A] Granulation Quality: [16:Small (1-33%)] [19:Medium (34-66%)] [20:Large (67-100%)] Necrotic A mount: [16:Fat Layer (Subcutaneous Tissue): Yes Fat Layer (Subcutaneous Tissue): Yes Fat Layer (Subcutaneous Tissue): Yes] Exposed Structures: [16:Fascia: No Tendon: No Muscle: No Joint: No Bone: No Medium (34-66%)] [19:Fascia: No Tendon: No Muscle: No Joint: No Bone: No Small (1-33%)] [20:Fascia: No Tendon: No Muscle: No Joint: No Bone: No None] Epithelialization: [16:N/A] [19:N/A] [20:Chemical/Enzymatic/Mechanical] Debridement: Pre-procedure Verification/Time Out N/A [19:N/A] [20:15:00] Taken: [16:N/A] [19:N/A] [20:N/A] Instrument: [16:N/A] [19:N/A] [20:None] Bleeding: [16:N/A] [19:N/A] [20:Procedure was tolerated well] Debridement Treatment Response: [16:N/A] [19:N/A] [20:1x0.8x0.5] Post Debridement Measurements L x W x D (cm) [16:N/A] [19:N/A] [20:0.314] Post  Debridement Volume: (cm) [16:N/A] [19:N/A] [20:Debridement] Wound Number: _0 Photos: Photos: No Photos No Photos No Photos Right, Posterior Lower Leg Right, Anterior Lower Leg Left, Lateral Foot Wound Location: Gradually Appeared Gradually Appeared Gradually Appeared 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 Primary Etiology: Anemia, Hypertension, Peripheral Anemia, Hypertension, Peripheral Anemia, Hypertension, Peripheral Comorbid History: Arterial Disease, Peripheral Venous Arterial Disease, Peripheral Venous Arterial Disease, Peripheral Venous Disease, Raynauds, Scleroderma, Disease, Raynauds, Scleroderma, Disease, Raynauds, Scleroderma, Rheumatoid Arthritis, Osteoarthritis Rheumatoid Arthritis, Osteoarthritis Rheumatoid Arthritis, Osteoarthritis 08/15/2020 10/10/2020 10/10/2020 Date Acquired: _1 Weeks of Treatment: Open Open Open Wound Status: Yes No No Clustered Wound: 3 N/A N/A Clustered Quantity: 10x5x0.9 0.9x0.5x0.2 0.7x0.5x0.4 Measurements L x W x D (cm) 39.27 0.353 0.275 A (cm) : rea 35.343 0.071 0.11 Volume (cm) : -3396.90% 70.80% 27.10% % Reduction in A rea: -31456.20% 80.40% -189.50% % Reduction in Volume: Full Thickness Without Exposed Full Thickness Without Exposed Full Thickness Without Exposed Classification: Support Structures Support Structures Support Structures Large Large Large Exudate A mount: Purulent Purulent Serous Exudate Type: yellow, brown, green yellow, brown, green amber Exudate Color: Distinct, outline attached Distinct, outline attached Distinct, outline attached Wound Margin: None Present (0%) None Present (0%) None Present (0%) Granulation A mount: N/A N/A N/A Granulation Quality: Large (67-100%) Large (67-100%) Large (67-100%) Necrotic A mount: Fat Layer (Subcutaneous Tissue): Yes Fat Layer (Subcutaneous Tissue): Yes Fat Layer (Subcutaneous Tissue): Yes Exposed Structures: Fascia:  No Fascia: No Fascia: No Tendon: No Tendon: No Tendon: No Muscle: No Muscle: No Muscle: No Joint: No Joint: No Joint: No Bone: No Bone: No Bone: No None None Small (1-33%) Epithelialization: N/A N/A Chemical/Enzymatic/Mechanical Debridement: Pre-procedure Verification/Time Out N/A N/A 15:00 Taken: N/A N/A N/A Instrument: N/A N/A None Bleeding: N/A N/A Procedure was tolerated well Debridement Treatment Response: N/A N/A 0.7x0.5x0.4 Post Debridement Measurements L x W x D (cm) N/A N/A 0.11 Post Debridement Volume: (cm) N/A N/A Debridement Procedures Performed: Wound Number: _2 Photos: No Photos No Photos No Photos Right, Lateral Lower Leg Right, Distal, Anterior Lower Leg Left, Lateral Lower Leg Wound Location: Gradually Appeared Gradually Appeared Trauma 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 Primary Etiology: Anemia, Hypertension, Peripheral Anemia, Hypertension, Peripheral Anemia, Hypertension, Peripheral Comorbid History: Arterial Disease, Peripheral Venous Arterial Disease, Peripheral Venous Arterial Disease, Peripheral Venous Disease, Raynauds, Scleroderma, Disease, Raynauds, Scleroderma, Disease, Raynauds, Scleroderma, Rheumatoid Arthritis, Osteoarthritis Rheumatoid Arthritis, Osteoarthritis Rheumatoid Arthritis, Osteoarthritis 10/27/2020 11/07/2020 12/09/2020 Date Acquired: 7 6 0 Weeks of Treatment: Open Open Open Wound Status: No No No Clustered Wound: N/A N/A N/A Clustered Quantity: 15x4x0.2 0.7x0.5x0.4 0.9x0.7x0.1 Measurements L x W x D (cm) 47.124 0.275 0.495 A (cm) : rea 9.425 0.11 0.049 Volume (cm) : -9420.00% 2.80% 58.00% % Reduction in A rea: -  6268.20% -292.90% 93.10% % Reduction in Volume: Full Thickness Without Exposed Full Thickness Without Exposed Full Thickness Without Exposed Classification: Support Structures Support Structures Support Structures Large Large Medium Exudate A  mount: Purulent Purulent Serosanguineous Exudate Type: yellow, brown, green yellow, brown, green red, brown Exudate Color: Flat and Intact Flat and Intact Distinct, outline attached Wound Margin: None Present (0%) None Present (0%) Small (1-33%) Granulation A mount: N/A N/A Red, Pink Granulation Quality: Large (67-100%) Large (67-100%) Large (67-100%) Necrotic A mount: Fat Layer (Subcutaneous Tissue): Yes Fat Layer (Subcutaneous Tissue): Yes Fascia: No Exposed Structures: Fascia: No Fascia: No Fat Layer (Subcutaneous Tissue): No Tendon: No Tendon: No Tendon: No Muscle: No Muscle: No Muscle: No Joint: No Joint: No Joint: No Bone: No Bone: No Bone: No None Small (1-33%) Small (1-33%) Epithelialization: N/A N/A Chemical/Enzymatic/Mechanical Debridement: Pre-procedure Verification/Time Out N/A N/A 15:00 Taken: N/A N/A N/A Instrument: N/A N/A None Bleeding: N/A N/A Procedure was tolerated well Debridement Treatment Response: Post Debridement Measurements L x N/A N/A 0.9x0.7x0.1 W x D (cm) N/A N/A 0.049 Post Debridement Volume: (cm) N/A N/A Debridement Procedures Performed: Wound Number: 29 5 N/A Photos: No Photos No Photos N/A Right, Proximal, Lateral Lower Leg Right, Medial Lower Leg N/A Wound Location: Gradually Appeared Gradually Appeared N/A Wounding 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 N/A Primary Etiology: Anemia, Hypertension, Peripheral Anemia, Hypertension, Peripheral N/A Comorbid History: Arterial Disease, Peripheral Venous Arterial Disease, Peripheral Venous Disease, Raynauds, Scleroderma, Disease, Raynauds, Scleroderma, Rheumatoid Arthritis, Osteoarthritis Rheumatoid Arthritis, Osteoarthritis 12/14/2020 01/13/2013 N/A Date Acquired: 0 412 N/A Weeks of Treatment: Open Open N/A Wound Status: No Yes N/A Clustered Wound: N/A N/A N/A Clustered Quantity: 1.5x1x0.6 2.5x2.2x0.2 N/A Measurements L x W x D (cm) 1.178 4.32 N/A A  (cm) : rea 0.707 0.864 N/A Volume (cm) : N/A -205.50% N/A % Reduction in A rea: N/A -512.80% N/A % Reduction in Volume: Full Thickness Without Exposed Full Thickness With Exposed Support N/A Classification: Support Structures Structures Large Large N/A Exudate A mount: Purulent Purulent N/A Exudate Type: yellow, brown, green yellow, brown, green N/A Exudate Color: Distinct, outline attached Distinct, outline attached N/A Wound Margin: None Present (0%) Medium (34-66%) N/A Granulation A mount: N/A Pink N/A Granulation Quality: Large (67-100%) Medium (34-66%) N/A Necrotic A mount: Fascia: No Fat Layer (Subcutaneous Tissue): Yes N/A Exposed Structures: Fat Layer (Subcutaneous Tissue): No Fascia: No Tendon: No Tendon: No Muscle: No Muscle: No Joint: No Joint: No Bone: No Bone: No None Small (1-33%) N/A Epithelialization: N/A N/A N/A Debridement: N/A N/A N/A Instrument: N/A N/A N/A Bleeding: Debridement Treatment Response: N/A N/A N/A Post Debridement Measurements L x N/A N/A N/A W x D (cm) N/A N/A N/A Post Debridement Volume: (cm) N/A N/A N/A Procedures Performed: Treatment Notes Electronic Signature(s) Signed: 12/19/2020 7:23:52 PM By: Linton Ham MD Signed: 12/21/2020 7:00:57 PM By: Levan Hurst RN, BSN Entered By: Linton Ham on 12/19/2020 17:38:54 -------------------------------------------------------------------------------- Multi-Disciplinary Care Plan Details Patient Name: Date of Service: [name], [name] 12/19/2020 2:00 PM Medical Record Number: [id number] Patient Account Number: [id number] Date of Birth/Sex: Treating RN: 1951/06/01 (68 y.o. [name] Primary Care Apple Dearmas: [name] Other Clinician: Referring Terika Pillard: Treating Irva Loser/Extender: [name] in Treatment: 456 Active Inactive Wound/Skin Impairment Nursing Diagnoses: Impaired tissue integrity Knowledge deficit related to  ulceration/compromised skin integrity Goals: Patient/caregiver will verbalize understanding of skin care regimen Date Initiated: 10/11/2016 Target Resolution Date: 01/06/2021 Goal Status: Active Interventions: Assess patient/caregiver ability to obtain necessary supplies Assess patient/caregiver  ability to perform ulcer/skin care regimen upon admission and as needed Assess ulceration(s) every visit Provide education on ulcer and skin care Treatment Activities: Skin care regimen initiated : 10/11/2016 Topical wound management initiated : 10/11/2016 Notes: Electronic Signature(s) Signed: 12/19/2020 5:38:15 PM By: Levan Hurst RN, BSN Entered By: Levan Hurst on 12/19/2020 17:23:49 -------------------------------------------------------------------------------- Pain Assessment Details Patient Name: Date of Service: [name], [name] 12/19/2020 2:00 PM Medical Record Number: [id number] Patient Account Number: [id number] Date of Birth/Sex: Treating RN: 04-05-1951 (69 y.o. [name] [name] Primary Care Roena Sassaman: [name] Other Clinician: Referring Akasia Ahmad: Treating Lauria Depoy/Extender: [name] in Treatment: 456 Active Problems Location of Pain Severity and Description of Pain Patient Has Paino No Site Locations Pain Management and Medication Current Pain Management: Electronic Signature(s) Signed: 12/21/2020 5:16:00 PM By: Rhae Hammock RN Entered By: Rhae Hammock on 12/19/2020 14:43:48 -------------------------------------------------------------------------------- Patient/Caregiver Education Details Patient Name: Date of Service: [name] 12/20/2021andnbsp2:00 PM Medical Record Number: [id number] Patient Account Number: [id number] Date of Birth/Gender: Treating RN: [birth year] (69 y.o. [name] Primary Care Physician: [name] Other Clinician: Referring Physician: Treating Physician/Extender: [name] in Treatment: [phone] Education Assessment Education Provided To: Patient Education Topics Provided Wound/Skin Impairment: Methods: Explain/Verbal Responses: State content correctly [employer]) Signed: 12/19/2020 5:38:15 PM By: Levan Hurst RN, BSN Entered By: Levan Hurst on 12/19/2020 17:24:24 -------------------------------------------------------------------------------- Wound Assessment Details Patient Name: Date of Service: [name], [name] 12/19/2020 2:00 PM Medical Record Number: [id number] Patient Account Number: [id number] Date of Birth/Sex: Treating RN: 09/04/1951 (69 y.o. [name] [name] Primary Care Martinique Pizzimenti: [name] Other Clinician: Referring Leam Madero: Treating Garnett Nunziata/Extender: [name] in Treatment: [place] Wound Status Wound Number: 16 Primary Pressure Ulcer Etiology: Wound Location: Right Calcaneus Wound Open Wounding Event: Pressure Injury Status: Date Acquired: 01/04/2020 Comorbid Anemia, Hypertension, Peripheral Arterial Disease, Peripheral Weeks Of Treatment: 50 History: Venous Disease, Raynauds, Scleroderma, Rheumatoid Arthritis, Clustered Wound: No Osteoarthritis Photos Photo Uploaded By: Mikeal Hawthorne on 12/20/2020 14:37:38 Wound Measurements Length: (cm) 0.5 Width: (cm) 1.9 Depth: (cm) 0.6 Area: (cm) 0.746 Volume: (cm) 0.448 % Reduction in Area: 94.1% % Reduction in Volume: 64.4% Epithelialization: Medium (34-66%) Tunneling: No Undermining: No Wound Description Classification: Category/Stage III Wound Margin: Epibole Exudate Amount: Small Exudate Type: Serosanguineous Exudate Color: red, brown Foul Odor After Cleansing: No Slough/Fibrino Yes Wound Bed Granulation Amount: Medium (34-66%) Exposed Structure Granulation Quality: Pink, Pale Fascia Exposed: No Necrotic Amount: Small (1-33%) Fat Layer (Subcutaneous Tissue) Exposed: Yes Necrotic Quality:  Adherent Slough Tendon Exposed: No Muscle Exposed: No Joint Exposed: No Bone Exposed: No Electronic Signature(s) Signed: 12/21/2020 5:16:00 PM By: Rhae Hammock RN Entered By: Rhae Hammock on 12/19/2020 15:24:58 -------------------------------------------------------------------------------- Wound Assessment Details Patient Name: Date of Service: [name] 12/19/2020 2:00 PM Medical Record Number: [id number] Patient Account Number: [id number] Date of Birth/Sex: Treating RN: 01/27/1951 (69 y.o. [name] [name] Primary Care Todd Argabright: [name] Other Clinician: Referring Ammi Hutt: Treating Kordel Leavy/Extender: [name] in Treatment: 456 Wound Status Wound Number: 19 Primary Venous Leg Ulcer Etiology: Wound Location: Right, Lateral Malleolus Wound Open Wounding Event: Gradually Appeared Status: Date Acquired: 05/02/2020 Comorbid Anemia, Hypertension, Peripheral Arterial Disease, Peripheral Weeks Of Treatment: 33 History: Venous Disease, Raynauds, Scleroderma, Rheumatoid Arthritis, Clustered Wound: No Osteoarthritis Photos Photo Uploaded By: Mikeal Hawthorne on 12/20/2020 14:37:22 Wound Measurements Length: (cm) 2.2 Width: (cm) 0.7 Depth: (cm) 0.2 Area: (cm) 1.21  Volume: (cm) 0.242 % Reduction in Area: -758.2% % Reduction in Volume: -764.3% Epithelialization: Small (1-33%) Tunneling: No Undermining: No Wound Description Classification: Full Thickness Without Exposed Support Structures Wound Margin: Distinct, outline attached Exudate Amount: Small Exudate Type: Serous Exudate Color: amber Foul Odor After Cleansing: No Slough/Fibrino Yes Wound Bed Granulation Amount: Medium (34-66%) Exposed Structure Granulation Quality: Pink Fascia Exposed: No Necrotic Amount: Medium (34-66%) Fat Layer (Subcutaneous Tissue) Exposed: Yes Necrotic Quality: Adherent Slough Tendon Exposed: No Muscle Exposed: No Joint Exposed:  No Bone Exposed: No Electronic Signature(s) Signed: 12/21/2020 5:16:00 PM By: Rhae Hammock RN Entered By: Rhae Hammock on 12/19/2020 15:25:43 -------------------------------------------------------------------------------- Wound Assessment Details Patient Name: Date of Service: [name] 12/19/2020 2:00 PM Medical Record Number: [id number] Patient Account Number: [id number] Date of Birth/Sex: Treating RN: 12-12-1951 (69 y.o. [name] [name] Primary Care Fayette Hamada: [name] Other Clinician: Referring Amauria Younts: Treating Gennaro Lizotte/Extender: [name] in Treatment: 456 Wound Status Wound Number: 20 Primary Arterial Insufficiency Ulcer Etiology: Wound Location: Left, Lateral Malleolus Wound Open Wounding Event: Gradually Appeared Status: Date Acquired: 05/02/2020 Comorbid Anemia, Hypertension, Peripheral Arterial Disease, Peripheral Weeks Of Treatment: 33 History: Venous Disease, Raynauds, Scleroderma, Rheumatoid Arthritis, Clustered Wound: No Osteoarthritis Photos Photo Uploaded By: Mikeal Hawthorne on 12/20/2020 14:35:36 Wound Measurements Length: (cm) 1 Width: (cm) 0.8 Depth: (cm) 0.5 Area: (cm) 0.628 Volume: (cm) 0.314 % Reduction in Area: -42.7% % Reduction in Volume: -256.8% Epithelialization: None Tunneling: No Undermining: No Wound Description Classification: Full Thickness Without Exposed Support Structures Wound Margin: Distinct, outline attached Exudate Amount: Small Exudate Type: Purulent Exudate Color: yellow, brown, green Wound Bed Granulation Amount: None Present (0%) Necrotic Amount: Large (67-100%) Necrotic Quality: Adherent Slough Foul Odor After Cleansing: No Slough/Fibrino Yes Exposed Structure Fascia Exposed: No Fat Layer (Subcutaneous Tissue) Exposed: Yes Tendon Exposed: No Muscle Exposed: No Joint Exposed: No Bone Exposed: No Electronic Signature(s) Signed: 12/21/2020 5:16:00 PM By:  Rhae Hammock RN Entered By: Rhae Hammock on 12/19/2020 15:27:07 -------------------------------------------------------------------------------- Wound Assessment Details Patient Name: Date of Service: [name] 12/19/2020 2:00 PM Medical Record Number: [id number] Patient Account Number: [id number] Date of Birth/Sex: Treating RN: 1951-07-19 (69 y.o. [name] [name] Primary Care Abdulhamid Olgin: [name] Other Clinician: Referring Larrissa Stivers: Treating Kipper Buch/Extender: [name] in Treatment: 456 Wound Status Wound Number: 23 Primary Venous Leg Ulcer Etiology: Wound Location: Right, Posterior Lower Leg Wound Open Wounding Event: Gradually Appeared Status: Date Acquired: 08/15/2020 Comorbid Anemia, Hypertension, Peripheral Arterial Disease, Peripheral Weeks Of Treatment: 18 History: Venous Disease, Raynauds, Scleroderma, Rheumatoid Arthritis, Clustered Wound: Yes Osteoarthritis Photos Photo Uploaded By: Mikeal Hawthorne on 12/20/2020 14:36:21 Wound Measurements Length: (cm) 10 Width: (cm) 5 Depth: (cm) 0.9 Clustered Quantity: 3 Area: (cm) 39.27 Volume: (cm) 35.343 % Reduction in Area: -3396.9% % Reduction in Volume: -31456.2% Epithelialization: None Tunneling: No Undermining: No Wound Description Classification: Full Thickness Without Exposed Support Stru Wound Margin: Distinct, outline attached Exudate Amount: Large Exudate Type: Purulent Exudate Color: yellow, brown, green ctures Foul Odor After Cleansing: No Slough/Fibrino Yes Wound Bed Granulation Amount: None Present (0%) Exposed Structure Necrotic Amount: Large (67-100%) Fascia Exposed: No Necrotic Quality: Adherent Slough Fat Layer (Subcutaneous Tissue) Exposed: Yes Tendon Exposed: No Muscle Exposed: No Joint Exposed: No Bone Exposed: No Electronic Signature(s) Signed: 12/21/2020 5:16:00 PM By: Rhae Hammock RN Entered By: Rhae Hammock on  12/19/2020 15:28:36 -------------------------------------------------------------------------------- Wound Assessment Details Patient Name: Date of Service: [name], JO A N F. 12/19/2020 2:00 PM  Medical Record Number: [id number] Patient Account Number: [id number] Date of Birth/Sex: Treating RN: [birth year] (69 y.o. [name] [name] Primary Care Bobbye Reinitz: [name] Other Clinician: Referring Emilyanne Mcgough: Treating Ondra Deboard/Extender: [name] in Treatment: 456 Wound Status Wound Number: 24 Primary Venous Leg Ulcer Etiology: Wound Location: Right, Anterior Lower Leg Wound Open Wounding Event: Gradually Appeared Status: Date Acquired: 10/10/2020 Comorbid Anemia, Hypertension, Peripheral Arterial Disease, Peripheral Weeks Of Treatment: 10 History: Venous Disease, Raynauds, Scleroderma, Rheumatoid Arthritis, Clustered Wound: No Osteoarthritis Photos Photo Uploaded By: Mikeal Hawthorne on 12/20/2020 14:36:57 Wound Measurements Length: (cm) 0.9 Width: (cm) 0.5 Depth: (cm) 0.2 Area: (cm) 0.353 Volume: (cm) 0.071 % Reduction in Area: 70.8% % Reduction in Volume: 80.4% Epithelialization: None Tunneling: No Undermining: No Wound Description Classification: Full Thickness Without Exposed Support Structures Wound Margin: Distinct, outline attached Exudate Amount: Large Exudate Type: Purulent Exudate Color: yellow, brown, green Foul Odor After Cleansing: No Slough/Fibrino Yes Wound Bed Granulation Amount: None Present (0%) Exposed Structure Necrotic Amount: Large (67-100%) Fascia Exposed: No Necrotic Quality: Adherent Slough Fat Layer (Subcutaneous Tissue) Exposed: Yes Tendon Exposed: No Muscle Exposed: No Joint Exposed: No Bone Exposed: No Electronic Signature(s) Signed: 12/21/2020 5:16:00 PM By: Rhae Hammock RN Entered By: Rhae Hammock on 12/19/2020  15:29:23 -------------------------------------------------------------------------------- Wound Assessment Details Patient Name: Date of Service: [name] 12/19/2020 2:00 PM Medical Record Number: [id number] Patient Account Number: [id number] Date of Birth/Sex: Treating RN: 12/19/1951 (69 y.o. [name] [name] Primary Care Esme Durkin: [name] Other Clinician: Referring Agapita Savarino: Treating Adaia Matthies/Extender: [name] in Treatment: 456 Wound Status Wound Number: 25 Primary Venous Leg Ulcer Etiology: Wound Location: Left, Lateral Foot Wound Open Wounding Event: Gradually Appeared Status: Date Acquired: 10/10/2020 Comorbid Anemia, Hypertension, Peripheral Arterial Disease, Peripheral Weeks Of Treatment: 10 History: Venous Disease, Raynauds, Scleroderma, Rheumatoid Arthritis, Clustered Wound: No Osteoarthritis Photos Photo Uploaded By: Mikeal Hawthorne on 12/20/2020 14:35:15 Wound Measurements Length: (cm) 0.7 Width: (cm) 0.5 Depth: (cm) 0.4 Area: (cm) 0.275 Volume: (cm) 0.11 % Reduction in Area: 27.1% % Reduction in Volume: -189.5% Epithelialization: Small (1-33%) Tunneling: No Undermining: No Wound Description Classification: Full Thickness Without Exposed Support Structures Wound Margin: Distinct, outline attached Exudate Amount: Large Exudate Type: Serous Exudate Color: amber Foul Odor After Cleansing: No Slough/Fibrino Yes Wound Bed Granulation Amount: None Present (0%) Exposed Structure Necrotic Amount: Large (67-100%) Fascia Exposed: No Necrotic Quality: Adherent Slough Fat Layer (Subcutaneous Tissue) Exposed: Yes Tendon Exposed: No Muscle Exposed: No Joint Exposed: No Bone Exposed: No Electronic Signature(s) Signed: 12/21/2020 5:16:00 PM By: Rhae Hammock RN Entered By: Rhae Hammock on 12/19/2020 15:30:05 -------------------------------------------------------------------------------- Wound Assessment  Details Patient Name: Date of Service: [name] 12/19/2020 2:00 PM Medical Record Number: [id number] Patient Account Number: [id number] Date of Birth/Sex: Treating RN: 10-24-1951 (69 y.o. [name] [name] Primary Care Daulton Harbaugh: [name] Other Clinician: Referring Breven Guidroz: Treating Gaylyn Berish/Extender: [name] in Treatment: 456 Wound Status Wound Number: 26 Primary Venous Leg Ulcer Etiology: Wound Location: Right, Lateral Lower Leg Wound Open Wounding Event: Gradually Appeared Status: Date Acquired: 10/27/2020 Comorbid Anemia, Hypertension, Peripheral Arterial Disease, Peripheral Weeks Of Treatment: 7 History: Venous Disease, Raynauds, Scleroderma, Rheumatoid Arthritis, Clustered Wound: No Osteoarthritis Photos Photo Uploaded By: Mikeal Hawthorne on 12/20/2020 14:37:23 Wound Measurements Length: (cm) 15 Width: (cm) 4 Depth: (cm) 0.2 Area: (cm) 47.124 Volume: (cm) 9.425 % Reduction in Area: -9420% % Reduction in Volume: -6268.2% Epithelialization: None Tunneling: No Undermining: No Wound Description Classification: Full  Thickness Without Exposed Support Structures Wound Margin: Flat and Intact Exudate Amount: Large Exudate Type: Purulent Exudate Color: yellow, brown, green Foul Odor After Cleansing: No Slough/Fibrino Yes Wound Bed Granulation Amount: None Present (0%) Exposed Structure Necrotic Amount: Large (67-100%) Fascia Exposed: No Necrotic Quality: Adherent Slough Fat Layer (Subcutaneous Tissue) Exposed: Yes Tendon Exposed: No Muscle Exposed: No Joint Exposed: No Bone Exposed: No Electronic Signature(s) Signed: 12/21/2020 5:16:00 PM By: Rhae Hammock RN Entered By: Rhae Hammock on 12/19/2020 15:31:06 -------------------------------------------------------------------------------- Wound Assessment Details Patient Name: Date of Service: [name] 12/19/2020 2:00 PM Medical Record Number:  [id number] Patient Account Number: [id number] Date of Birth/Sex: Treating RN: 01-12-1951 (69 y.o. [name] [name] Primary Care Loxley Cibrian: [name] Other Clinician: Referring Marlea Gambill: Treating Lakita Sahlin/Extender: [name] in Treatment: [place] Wound Status Wound Number: 27 Primary Venous Leg Ulcer Etiology: Wound Location: Right, Distal, Anterior Lower Leg Wound Open Wounding Event: Gradually Appeared Status: Date Acquired: 11/07/2020 Comorbid Anemia, Hypertension, Peripheral Arterial Disease, Peripheral Weeks Of Treatment: 6 History: Venous Disease, Raynauds, Scleroderma, Rheumatoid Arthritis, Clustered Wound: No Osteoarthritis Photos Photo Uploaded By: Mikeal Hawthorne on 12/20/2020 14:36:57 Wound Measurements Length: (cm) 0.7 Width: (cm) 0.5 Depth: (cm) 0.4 Area: (cm) 0.275 Volume: (cm) 0.11 % Reduction in Area: 2.8% % Reduction in Volume: -292.9% Epithelialization: Small (1-33%) Tunneling: No Undermining: No Wound Description Classification: Full Thickness Without Exposed Support Structures Wound Margin: Flat and Intact Exudate Amount: Large Exudate Type: Purulent Exudate Color: yellow, brown, green Foul Odor After Cleansing: No Slough/Fibrino Yes Wound Bed Granulation Amount: None Present (0%) Exposed Structure Necrotic Amount: Large (67-100%) Fascia Exposed: No Necrotic Quality: Adherent Slough Fat Layer (Subcutaneous Tissue) Exposed: Yes Tendon Exposed: No Muscle Exposed: No Joint Exposed: No Bone Exposed: No Electronic Signature(s) Signed: 12/21/2020 5:16:00 PM By: Rhae Hammock RN Entered By: Rhae Hammock on 12/19/2020 15:31:51 -------------------------------------------------------------------------------- Wound Assessment Details Patient Name: Date of Service: [name] 12/19/2020 2:00 PM Medical Record Number: [id number] Patient Account Number: [id number] Date of Birth/Sex: Treating RN: [birth year]  (69 y.o. [name] [name] Primary Care Laylana Gerwig: [name] Other Clinician: Referring Elyanna Wallick: Treating Jude Linck/Extender: [name] in Treatment: [place] Wound Status Wound Number: 28 Primary Arterial Insufficiency Ulcer Etiology: Wound Location: Left, Lateral Lower Leg Wound Open Wounding Event: Trauma Status: Date Acquired: 12/09/2020 Comorbid Anemia, Hypertension, Peripheral Arterial Disease, Peripheral Weeks Of Treatment: 0 History: Venous Disease, Raynauds, Scleroderma, Rheumatoid Arthritis, Clustered Wound: No Osteoarthritis Photos Photo Uploaded By: Mikeal Hawthorne on 12/20/2020 14:35:59 Wound Measurements Length: (cm) 0.9 Width: (cm) 0.7 Depth: (cm) 0.1 Area: (cm) 0.495 Volume: (cm) 0.049 % Reduction in Area: 58% % Reduction in Volume: 93.1% Epithelialization: Small (1-33%) Tunneling: No Undermining: No Wound Description Classification: Full Thickness Without Exposed Support Structures Wound Margin: Distinct, outline attached Exudate Amount: Medium Exudate Type: Serosanguineous Exudate Color: red, brown Foul Odor After Cleansing: No Slough/Fibrino Yes Wound Bed Granulation Amount: Small (1-33%) Exposed Structure Granulation Quality: Red, Pink Fascia Exposed: No Necrotic Amount: Large (67-100%) Fat Layer (Subcutaneous Tissue) Exposed: No Necrotic Quality: Adherent Slough Tendon Exposed: No Muscle Exposed: No Joint Exposed: No Bone Exposed: No Electronic Signature(s) Signed: 12/21/2020 5:16:00 PM By: Rhae Hammock RN Entered By: Rhae Hammock on 12/19/2020 15:43:17 -------------------------------------------------------------------------------- Wound Assessment Details Patient Name: Date of Service: [name] 12/19/2020 2:00 PM Medical Record Number: [id number] Patient Account Number: [id number] Date of Birth/Sex: Treating RN: October 12, 1951 (69 y.o. Benjaman Lobe Primary Care Shelene Krage: Renold Genta,  Mary Other Clinician: Referring Rifka Ramey: Treating Trenda Corliss/Extender: [name] in Treatment: [place] Wound Status Wound Number: 29 Primary Arterial Insufficiency Ulcer Etiology: Wound Location: Right, Proximal, Lateral Lower Leg Wound Open Wounding Event: Gradually Appeared Status: Date Acquired: 12/14/2020 Comorbid Anemia, Hypertension, Peripheral Arterial Disease, Peripheral Weeks Of Treatment: 0 History: Venous Disease, Raynauds, Scleroderma, Rheumatoid Arthritis, Clustered Wound: No Osteoarthritis Photos Photo Uploaded By: Mikeal Hawthorne on 12/20/2020 14:36:00 Wound Measurements Length: (cm) 1.5 Width: (cm) 1 Depth: (cm) 0.6 Area: (cm) 1.178 Volume: (cm) 0.707 % Reduction in Area: % Reduction in Volume: Epithelialization: None Tunneling: No Undermining: No Wound Description Classification: Full Thickness Without Exposed Support Structures Wound Margin: Distinct, outline attached Exudate Amount: Large Exudate Type: Purulent Exudate Color: yellow, brown, green Foul Odor After Cleansing: No Slough/Fibrino Yes Wound Bed Granulation Amount: None Present (0%) Exposed Structure Necrotic Amount: Large (67-100%) Fascia Exposed: No Necrotic Quality: Adherent Slough Fat Layer (Subcutaneous Tissue) Exposed: No Tendon Exposed: No Muscle Exposed: No Joint Exposed: No Bone Exposed: No Electronic Signature(s) Signed: 12/21/2020 5:16:00 PM By: Rhae Hammock RN Entered By: Rhae Hammock on 12/19/2020 15:42:05 -------------------------------------------------------------------------------- Wound Assessment Details Patient Name: Date of Service: [name] 12/19/2020 2:00 PM Medical Record Number: [id number] Patient Account Number: [id number] Date of Birth/Sex: Treating RN: November 29, 1951 (69 y.o. [name] [name] Primary Care Virat Prather: [name] Other Clinician: Referring Arvon Schreiner: Treating Llewellyn Choplin/Extender: [name] in Treatment: 456 Wound Status Wound Number: 5 Primary Venous Leg Ulcer Etiology: Wound Location: Right, Medial Lower Leg Wound Open Wounding Event: Gradually Appeared Status: Date Acquired: 01/13/2013 Comorbid Anemia, Hypertension, Peripheral Arterial Disease, Peripheral Weeks Of Treatment: 412 History: Venous Disease, Raynauds, Scleroderma, Rheumatoid Arthritis, Clustered Wound: Yes Osteoarthritis Photos Photo Uploaded By: Mikeal Hawthorne on 12/20/2020 14:36:35 Wound Measurements Length: (cm) 2.5 Width: (cm) 2.2 Depth: (cm) 0.2 Area: (cm) 4.32 Volume: (cm) 0.864 % Reduction in Area: -205.5% % Reduction in Volume: -512.8% Epithelialization: Small (1-33%) Wound Description Classification: Full Thickness With Exposed Support Structures Wound Margin: Distinct, outline attached Exudate Amount: Large Exudate Type: Purulent Exudate Color: yellow, brown, green Foul Odor After Cleansing: No Slough/Fibrino Yes Wound Bed Granulation Amount: Medium (34-66%) Exposed Structure Granulation Quality: Pink Fascia Exposed: No Necrotic Amount: Medium (34-66%) Fat Layer (Subcutaneous Tissue) Exposed: Yes Necrotic Quality: Adherent Slough Tendon Exposed: No Muscle Exposed: No Joint Exposed: No Bone Exposed: No Electronic Signature(s) Signed: 12/21/2020 5:16:00 PM By: Rhae Hammock RN Entered By: Rhae Hammock on 12/19/2020 15:33:35 -------------------------------------------------------------------------------- Vitals Details Patient Name: Date of Service: [name], [name] 12/19/2020 2:00 PM Medical Record Number: [id number] Patient Account Number: [id number] Date of Birth/Sex: Treating RN: Jun 13, 1951 (69 y.o. [name] [name] Primary Care Alban Marucci: [name] Other Clinician: Referring Abia Monaco: Treating Nikolette Reindl/Extender: [name] in Treatment: 456 Vital Signs Time Taken: 14:43 Temperature (F):  98.7 Height (in): 68 Pulse (bpm): 92 Weight (lbs): 132 Respiratory Rate (breaths/min): 17 Body Mass Index (BMI): 20.1 Blood Pressure (mmHg): 184/63 Reference Range: 80 - 120 mg / dl Electronic Signature(s) Signed: 12/21/2020 5:16:00 PM By: Rhae Hammock RN Entered By: Rhae Hammock on 12/19/2020 14:43:40

## 2020-12-21 NOTE — Progress Notes (Signed)
EDITHE, DOBBIN ([id number]) Visit Report for 12/19/2020 Fall Risk Assessment Details Patient Name: Date of Service: [name] 12/19/2020 2:00 PM Medical Record Number: [id number] Patient Account Number: [id number] Date of Birth/Sex: Treating RN: Mar 20, 1951 (69 y.o. [name], [name] Primary Care Ario Mcdiarmid: [name] Other Clinician: Referring Sunshine Mackowski: Treating Hazel Wrinkle/Extender: [name] in Treatment: [phone] Fall Risk Assessment Items Have you had 2 or more falls in the last 12 monthso 0 Yes Have you had any fall that resulted in injury in the last 12 monthso 0 No FALLS RISK SCREEN History of falling - immediate or within 3 months 25 Yes Secondary diagnosis (Do you have 2 or more medical diagnoseso) 0 No Ambulatory aid None/bed rest/wheelchair/nurse 0 Yes Crutches/cane/walker 0 No Furniture 0 No Intravenous therapy Access/Saline/Heparin Lock 0 No Gait/Transferring Normal/ bed rest/ wheelchair 0 No Weak (short steps with or without shuffle, stooped but able to lift head while walking, may seek 10 Yes support from furniture) Impaired (short steps with shuffle, may have difficulty arising from chair, head down, impaired 20 Yes balance) Mental Status Oriented to own ability 0 Yes Electronic Signature(s) Signed: 12/21/2020 5:16:00 PM By: Rhae Hammock RN Entered By: Rhae Hammock on 12/19/2020 14:43:14

## 2020-12-22 ENCOUNTER — Other Ambulatory Visit: Payer: Self-pay

## 2020-12-22 ENCOUNTER — Encounter (HOSPITAL_BASED_OUTPATIENT_CLINIC_OR_DEPARTMENT_OTHER): Payer: Medicare Other | Admitting: Internal Medicine

## 2020-12-22 DIAGNOSIS — L97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: Secondary | ICD-10-CM | POA: Diagnosis not present

## 2020-12-22 DIAGNOSIS — I87331 Chronic venous hypertension (idiopathic) with ulcer and inflammation of right lower extremity: Secondary | ICD-10-CM | POA: Diagnosis not present

## 2020-12-22 DIAGNOSIS — L97523 Non-pressure chronic ulcer of other part of left foot with necrosis of muscle: Secondary | ICD-10-CM | POA: Diagnosis not present

## 2020-12-22 DIAGNOSIS — L97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: Secondary | ICD-10-CM | POA: Diagnosis not present

## 2020-12-22 DIAGNOSIS — L97811 Non-pressure chronic ulcer of other part of right lower leg limited to breakdown of skin: Secondary | ICD-10-CM | POA: Diagnosis not present

## 2020-12-22 DIAGNOSIS — L89616 Pressure-induced deep tissue damage of right heel: Secondary | ICD-10-CM | POA: Diagnosis not present

## 2020-12-25 LAB — AEROBIC/ANAEROBIC CULTURE W GRAM STAIN (SURGICAL/DEEP WOUND): Gram Stain: NONE SEEN

## 2020-12-26 ENCOUNTER — Other Ambulatory Visit: Payer: Self-pay

## 2020-12-26 ENCOUNTER — Encounter (HOSPITAL_BASED_OUTPATIENT_CLINIC_OR_DEPARTMENT_OTHER): Payer: Medicare Other | Admitting: Internal Medicine

## 2020-12-26 DIAGNOSIS — I87331 Chronic venous hypertension (idiopathic) with ulcer and inflammation of right lower extremity: Secondary | ICD-10-CM | POA: Diagnosis not present

## 2020-12-26 DIAGNOSIS — L97212 Non-pressure chronic ulcer of right calf with fat layer exposed: Secondary | ICD-10-CM | POA: Diagnosis not present

## 2020-12-26 DIAGNOSIS — L97512 Non-pressure chronic ulcer of other part of right foot with fat layer exposed: Secondary | ICD-10-CM | POA: Diagnosis not present

## 2020-12-26 DIAGNOSIS — L97812 Non-pressure chronic ulcer of other part of right lower leg with fat layer exposed: Secondary | ICD-10-CM | POA: Diagnosis not present

## 2020-12-27 NOTE — Progress Notes (Signed)
[name] [name], [name] [name] ([id number]) Visit Report for 12/26/2020 Arrival Information Details Patient Name: Date of Service: [name] 12/26/2020 2:00 PM Medical Record Number: [id number] Patient Account Number: [id number] Date of Birth/Sex: Treating RN: December 26, 1951 (69 y.o. [name] [name], Meta.Reding Primary Care Margretta Zamorano: [name] Other Clinician: Referring Okla Qazi: Treating Melany Wiesman/Extender: [name] Kelp in Treatment: 38 Visit Information History Since Last Visit Added or deleted any medications: No Patient Arrived: Wheel Chair Any new allergies or adverse reactions: No Arrival Time: 14:14 Had a fall or experienced change in No Accompanied By: self activities of daily living that may affect Transfer Assistance: None risk of falls: Patient Identification Verified: Yes Signs or symptoms of abuse/neglect since last visito No Secondary Verification Process Completed: Yes Hospitalized since last visit: No Patient Requires Transmission-Based Precautions: No Implantable device outside of the clinic excluding No Patient Has Alerts: Yes cellular tissue based products placed in the center Patient Alerts: R ABI: [place] TBI: 0.41 since last visit: L ABI: [place] (07/2020) Has Dressing in Place as Prescribed: Yes Pain Present Now: Yes Electronic Signature(s) Signed: 12/27/2020 12:40:32 PM By: Sandre Kitty Entered By: Sandre Kitty on 12/26/2020 14:16:15 -------------------------------------------------------------------------------- Clinic Level of Care Assessment Details Patient Name: Date of Service: [name]. 12/26/2020 2:00 PM Medical Record Number: [id number] Patient Account Number: [id number] Date of Birth/Sex: Treating RN: 1951-06-15 (69 y.o. [name] [name] Primary Care Hibah Odonnell: [name] Other Clinician: Referring Lei Dower: Treating Alpha Mysliwiec/Extender: [name] Kelp in Treatment: [facility] Level of Care Assessment  Items TOOL 4 Quantity Score _0  - 0 Use when only an EandM is performed on FOLLOW-UP visit ASSESSMENTS - Nursing Assessment / Reassessment X- 1 10 Reassessment of Co-morbidities (includes updates in patient status) X- 1 5 Reassessment of Adherence to Treatment Plan ASSESSMENTS - Wound and Skin A ssessment / Reassessment _1  - 0 Simple Wound Assessment / Reassessment - one wound X- 12 5 Complex Wound Assessment / Reassessment - multiple wounds _2  - 0 Dermatologic / Skin Assessment (not related to wound area) ASSESSMENTS - Focused Assessment X- 2 5 Circumferential Edema Measurements - multi extremities _3  - 0 Nutritional Assessment / Counseling / Intervention X- 1 5 Lower Extremity Assessment (monofilament, tuning fork, pulses) _4  - 0 Peripheral Arterial Disease Assessment (using hand held doppler) ASSESSMENTS - Ostomy and/or Continence Assessment and Care _5  - 0 Incontinence Assessment and Management _6  - 0 Ostomy Care Assessment and Management (repouching, etc.) PROCESS - Coordination of Care X - Simple Patient / Family Education for ongoing care 1 15 _7  - 0 Complex (extensive) Patient / Family Education for ongoing care X- 1 10 Staff obtains Programmer, systems, Records, T Results / Process Orders est _8  - 0 Staff telephones HHA, Nursing Homes / Clarify orders / etc _9  - 0 Routine Transfer to another Facility (non-emergent condition) _10  - 0 Routine Hospital Admission (non-emergent condition) _11  - 0 New Admissions / Biomedical engineer / Ordering NPWT Apligraf, etc. , _12  - 0 Emergency Hospital Admission (emergent condition) X- 1 10 Simple Discharge Coordination _13  - 0 Complex (extensive) Discharge Coordination PROCESS - Special Needs _14  - 0 Pediatric / Minor Patient Management _15  - 0 Isolation Patient Management _16  - 0 Hearing / Language / Visual special needs _17  - 0 Assessment of Community assistance (transportation, D/C planning, etc.) _18  - 0 Additional  assistance / Altered mentation _19  - 0 Support Surface(s) Assessment (bed, cushion, seat, etc.) INTERVENTIONS - Wound Cleansing / Measurement _20  - 0 Simple  Wound Cleansing - one wound X- 12 5 Complex Wound Cleansing - multiple wounds X- 1 5 Wound Imaging (photographs - any number of wounds) _0  - 0 Wound Tracing (instead of photographs) _1  - 0 Simple Wound Measurement - one wound X- 12 5 Complex Wound Measurement - multiple wounds INTERVENTIONS - Wound Dressings _2  - 0 Small Wound Dressing one or multiple wounds X- 2 15 Medium Wound Dressing one or multiple wounds _3  - 0 Large Wound Dressing one or multiple wounds X- 1 5 Application of Medications - topical <[id number]>_2<\/[id number]>_1  - 0 Application of Medications - injection INTERVENTIONS - Miscellaneous _5  - 0 External ear exam _6  - 0 Specimen Collection (cultures, biopsies, blood, body fluids, etc.) _7  - 0 Specimen(s) / Culture(s) sent or taken to Lab for analysis _8  - 0 Patient Transfer (multiple staff / Civil Service fast streamer / Similar devices) _9  - 0 Simple Staple / Suture removal (25 or less) _10  - 0 Complex Staple / Suture removal (26 or more) _11  - 0 Hypo / Hyperglycemic Management (close monitor of Blood Glucose) _12  - 0 Ankle / Brachial Index (ABI) - do not check if billed separately X- 1 5 Vital Signs Has the patient been seen at the hospital within the last three years: Yes Total Score: 290 Level Of Care: New/Established - Level 5 Electronic Signature(s) Signed: 12/26/2020 5:55:15 PM By: Baruch Gouty RN, BSN Entered By: Baruch Gouty on 12/26/2020 16:43:50 -------------------------------------------------------------------------------- Encounter Discharge Information Details Patient Name: Date of Service: [name], [name] 12/26/2020 2:00 PM Medical Record Number: [id number] Patient Account Number: [id number] Date of Birth/Sex: Treating RN: Nov 23, 1951 (69 y.o. [name] [name] Primary Care Marvin Maenza: [name] Other  Clinician: Referring Oden Lindaman: Treating Janele Lague/Extender: [name] Kelp in Treatment: [phone] Encounter Discharge Information Items Discharge Condition: Stable Ambulatory Status: Wheelchair Discharge Destination: Home Transportation: Private Auto Accompanied By: spouse Schedule Follow-up Appointment: Yes Clinical Summary of Care: Patient Declined Electronic Signature(s) Signed: 12/26/2020 5:55:15 PM By: Baruch Gouty RN, BSN Entered By: Baruch Gouty on 12/26/2020 15:53:40 -------------------------------------------------------------------------------- Lower Extremity Assessment Details Patient Name: Date of Service: [name], [name] 12/26/2020 2:00 PM Medical Record Number: [id number] Patient Account Number: [id number] Date of Birth/Sex: Treating RN: [birth year] (69 y.o. [name] [name] Primary Care Ouida Abeyta: [name] Other Clinician: Referring Mavis Gravelle: Treating Vuk Skillern/Extender: [name] Kelp in Treatment: 457 Edema Assessment Assessed: [name]: No] [Right: No] Edema: [Left: Yes] [Right: Yes] Calf Left: Right: Point of Measurement: 36 cm From Medial Instep 32.5 cm 33.5 cm Ankle Left: Right: Point of Measurement: 10 cm From Medial Instep 22.3 cm 21.5 cm Vascular Assessment Pulses: Dorsalis Pedis Palpable: [Left:No] [Right:No] Electronic Signature(s) Signed: 12/26/2020 5:55:15 PM By: Baruch Gouty RN, BSN Entered By: Baruch Gouty on 12/26/2020 14:56:18 -------------------------------------------------------------------------------- Multi Wound Chart Details Patient Name: Date of Service: [name] 12/26/2020 2:00 PM Medical Record Number: [id number] Patient Account Number: [id number] Date of Birth/Sex: Treating RN: 08-Oct-1951 (69 y.o. 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] Primary Etiology: [16:Anemia, Hypertension, Peripheral] [19:Anemia, Hypertension, Peripheral] [20:Anemia, Hypertension, Peripheral] Comorbid History: [  16:Arterial Disease, Peripheral Venous Disease, Raynauds, Scleroderma, Rheumatoid Arthritis, Osteoarthritis 01/04/2020] [19:Arterial Disease, Peripheral Venous Disease, Raynauds, Scleroderma, Rheumatoid Arthritis, Osteoarthritis  05/02/2020] [20:Arterial Disease, Peripheral Venous Disease, Raynauds, Scleroderma, Rheumatoid Arthritis, Osteoarthritis 05/02/2020] Date Acquired: [16:51] [19:34] [20:34] Weeks of Treatment: [16:Open] [19:Open] [20:Open] Wound Status: [16:No] [19:No] [20:No] Clustered Wound: [16:0.5x1x0.4] [19:0.5x0.2x0.2] [20:0.9x0.7x0.1] Measurements L x W x D (cm) [16:0.393] [19:0.079] [20:0.495] A (cm) : rea [16:0.157] [19:0.016] [20:0.049] Volume (cm) : [16:96.90%] [19:44.00%] [20:-12.50%] % Reduction in Area: [16:87.50%] [19:42.90%] [20:44.30%] % Reduction in Volume: [16:Category/Stage III] [19:Full Thickness Without Exposed] [20:Full Thickness Without Exposed] Classification: [16:Small] [19:Support Structures Small] [20:Support Structures Small] Exudate A mount: [16:Serous] [19:Purulent] [20:Serous] Exudate Type: [16:amber] [19:yellow, brown, green] [20:amber] Exudate Color: [16:No] [19:No] [20:No] Foul Odor A Cleansing: [16:fter N/A] [19:N/A] [20:N/A] Odor Anticipated Due to Product Use: [16:Distinct, outline attached] [19:Distinct, outline attached]  [20:Flat and Intact] Wound Margin: [16:None Present (0%)] [19:Medium (34-66%)] [20:None Present (0%)] Granulation A mount: [16:N/A] [19:Pink] [20:N/A] Granulation Quality: [16:Large (67-100%)] [19:Medium (34-66%)] [20:Large (67-100%)] Necrotic Amount: [16:Adherent Slough] [19:Adherent Slough] [20:Adherent Slough] Necrotic Tissue: [16:Fat Layer (Subcutaneous Tissue): Yes Fat Layer (Subcutaneous Tissue): Yes Fat Layer (Subcutaneous Tissue): Yes] Exposed Structures: [16:Fascia: No Tendon: No Muscle: No Joint: No Bone: No Small (1-33%)] [19:Fascia: No Tendon: No Muscle: No Joint: No Bone: No Small (1-33%)] [20:Fascia: No Tendon: No Muscle: No Joint: No Bone: No Small (1-33%)] Wound Number: _0 Photos: No Photos No Photos No Photos Right, Posterior Lower Leg Right, Anterior Lower Leg Left, Lateral Foot Wound Location: Gradually Appeared Gradually Appeared Gradually Appeared 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 Primary Etiology: Anemia, Hypertension, Peripheral Anemia, Hypertension, Peripheral Anemia, Hypertension, Peripheral Comorbid History: Arterial Disease, Peripheral Venous Arterial Disease, Peripheral Venous Arterial Disease, Peripheral Venous Disease, Raynauds, Scleroderma, Disease, Raynauds, Scleroderma, Disease, Raynauds, Scleroderma, Rheumatoid Arthritis, Osteoarthritis Rheumatoid Arthritis, Osteoarthritis Rheumatoid Arthritis, Osteoarthritis 08/15/2020 10/10/2020 10/10/2020 Date Acquired: _1 Weeks of Treatment: Open Open Open Wound Status: Yes No No Clustered Wound: 8.5x4.5x0.3 1.6x1x0.2 1x0.8x0.1 Measurements L x W x D (cm) 30.041 1.257 0.628 A (cm) : rea 9.012 0.251 0.063 Volume (cm) : -2575.10% -3.90% -66.60% % Reduction in Area: -7946.40% 30.90% -65.80% % Reduction in Volume: Full Thickness Without Exposed Full Thickness Without Exposed Full Thickness Without Exposed Classification: Support Structures Support Structures  Support Structures Large Medium Small Exudate A mount: Purulent Serous Serous Exudate Type: yellow, brown, green amber amber Exudate Color: Yes No No Foul Odor A Cleansing: fter No N/A N/A Odor Anticipated Due to Product Use: Distinct, outline attached Flat and Intact Flat and Intact Wound Margin: None Present (0%) None Present (0%) None Present (0%) Granulation A mount: N/A N/A N/A Granulation Quality: Large (67-100%) Large (67-100%) Large (67-100%) Necrotic Amount: Eschar, Adherent [place] Adherent Slough Necrotic Tissue: Fat Layer (Subcutaneous Tissue): Yes Fat Layer (Subcutaneous Tissue): Yes Fat Layer (Subcutaneous Tissue): Yes Exposed Structures: Fascia: No Fascia: No Fascia: No Tendon: No Tendon: No Tendon: No Muscle: No Muscle: No Muscle: No Joint: No Joint: No Joint: No Bone: No Bone: No Bone: No None Small (1-33%) Small (1-33%) Epithelialization: Wound Number: _2 Photos: No Photos No Photos No Photos Right, Lateral Lower Leg Right, Distal, Anterior Lower Leg Left, Lateral Lower Leg Wound Location: Gradually Appeared Gradually Appeared Trauma 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 Primary Etiology: Anemia, Hypertension, Peripheral Anemia, Hypertension, Peripheral Anemia, Hypertension, Peripheral Comorbid History: Arterial Disease, Peripheral Venous Arterial Disease, Peripheral Venous Arterial Disease, Peripheral Venous  Disease, Raynauds, Scleroderma, Disease, Raynauds, Scleroderma, Disease, Raynauds, Scleroderma, Rheumatoid Arthritis, Osteoarthritis Rheumatoid Arthritis, Osteoarthritis Rheumatoid Arthritis, Osteoarthritis 10/27/2020 11/07/2020 12/09/2020 Date Acquired: _0 Weeks of Treatment: Open Open Open Wound Status: No No No Clustered Wound: 12x5x0.1 2x0.6x0.1 0.6x0.3x0.1 Measurements L x W x D (cm) 47.124 0.942 0.141 A (cm) : rea 4.712 0.094 0.014 Volume (cm)  : -9420.00% -232.90% 71.50% % Reduction in Area: -3083.80% -235.70% 71.40% % Reduction in Volume: Full Thickness Without Exposed Full Thickness Without Exposed Full Thickness Without Exposed Classification: Support Structures Support Structures Support Structures Large Medium Small Exudate A mount: Purulent Serous Serous Exudate Type: yellow, brown, green amber amber Exudate Color: Yes No No Foul Odor A Cleansing: fter No N/A N/A Odor Anticipated Due to Product Use: Flat and Intact Flat and Intact Flat and Intact Wound Margin: None Present (0%) None Present (0%) Large (67-100%) Granulation A mount: N/A N/A Pink Granulation Quality: Large (67-100%) Large (67-100%) Small (1-33%) Necrotic Amount: Eschar Adherent Slough Adherent Slough Necrotic Tissue: Fat Layer (Subcutaneous Tissue): Yes Fat Layer (Subcutaneous Tissue): Yes Fat Layer (Subcutaneous Tissue): Yes Exposed Structures: Fascia: No Fascia: No Fascia: No Tendon: No Tendon: No Tendon: No Muscle: No Muscle: No Muscle: No Joint: No Joint: No Joint: No Bone: No Bone: No Bone: No Small (1-33%) None Small (1-33%) Epithelialization: Wound Number: _1 Photos: No Photos No Photos No Photos Right, Proximal, Lateral Lower Leg Right T Fourth oe Right, Medial Lower Leg Wound Location: Gradually Appeared Gradually Appeared Gradually Appeared 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 o Venous Leg Ulcer Primary Etiology: Anemia, Hypertension, Peripheral Anemia, Hypertension, Peripheral Anemia, Hypertension, Peripheral Comorbid History: Arterial Disease, Peripheral Venous Arterial Disease, Peripheral Venous Arterial Disease, Peripheral Venous Disease, Raynauds, Scleroderma, Disease, Raynauds, Scleroderma, Disease, Raynauds, Scleroderma, Rheumatoid Arthritis, Osteoarthritis Rheumatoid Arthritis, Osteoarthritis Rheumatoid Arthritis, Osteoarthritis 12/14/2020 12/26/2020 01/13/2013 Date  Acquired: 1 0 413 Weeks of Treatment: Open Open Open Wound Status: No No Yes Clustered Wound: 1x1x0.1 2x2.8x0.1 2.1x2.5x0.1 Measurements L x W x D (cm) 0.785 4.398 4.123 A (cm) : rea 0.079 0.44 0.412 Volume (cm) : 33.40% 0.00% -191.60% % Reduction in Area: 88.80% 0.00% -192.20% % Reduction in Volume: Full Thickness Without Exposed Full Thickness Without Exposed Full Thickness With Exposed Support Classification: Support Structures Support Structures Structures Medium Medium Medium Exudate A mount: Purulent Purulent Serous Exudate Type: yellow, brown, green yellow, brown, green amber Exudate Color: No No No Foul Odor A Cleansing: fter N/A N/A N/A Odor Anticipated Due to Product Use: Flat and Intact Flat and Intact Flat and Intact Wound Margin: None Present (0%) None Present (0%) Small (1-33%) Granulation A mount: N/A N/A Pink Granulation Quality: Large (67-100%) Large (67-100%) Large (67-100%) Necrotic Amount: Adherent [place] Necrotic Tissue: Fat Layer (Subcutaneous Tissue): Yes Fat Layer (Subcutaneous Tissue): Yes Fat Layer (Subcutaneous Tissue): Yes Exposed Structures: Fascia: No Fascia: No Fascia: No Tendon: No Tendon: No Tendon: No Muscle: No Muscle: No Muscle: No Joint: No Joint: No Joint: No Bone: No Bone: No Bone: No Small (1-33%) None Small (1-33%) Epithelialization: Treatment Notes Wound #16 (Calcaneus) Wound Laterality: Right Cleanser Soap and Water Discharge Instruction: May shower and wash wound with dial antibacterial soap and water prior to dressing change. Peri-Wound Care Sween Lotion (Moisturizing lotion) Discharge Instruction: Apply moisturizing lotion as directed Topical Primary Dressing IODOFLEX 0.9% Cadexomer Iodine Pad 4x6 cm Discharge Instruction: Apply Iodoflex or Iodosorb to wound bed as instructed Secondary Dressing Woven Gauze Sponge, Non-Sterile 4x4 in Discharge Instruction:  Apply over primary dressing as directed. ABD Pad, 8x10 Discharge Instruction: Apply over primary dressing as directed. Secured With Compression Wrap Kerlix Roll 4.5x3.1 (in/yd) Discharge Instruction: Apply Kerlix and Coban compression as directed. Coban Self-Adherent Wrap 4x5 (in/yd) Discharge Instruction: Apply over Kerlix as directed. Compression Stockings Add-Ons Wound #19 (Malleolus) Wound Laterality: Right, Lateral Cleanser Soap and Water Discharge Instruction: May shower and wash wound with dial antibacterial soap and water prior to dressing change. Peri-Wound Care Sween Lotion (Moisturizing lotion) Discharge Instruction: Apply moisturizing lotion as directed Topical Primary Dressing IODOFLEX 0.9% Cadexomer Iodine Pad 4x6 cm Discharge Instruction: Apply Iodoflex or Iodosorb to wound bed as instructed Secondary Dressing Woven Gauze Sponge, Non-Sterile 4x4 in Discharge Instruction: Apply over primary dressing as directed. ABD Pad, 8x10 Discharge Instruction: Apply over primary dressing as directed. Secured With Compression Wrap Kerlix Roll 4.5x3.1 (in/yd) Discharge Instruction: Apply Kerlix and Coban compression as directed. Coban Self-Adherent Wrap 4x5 (in/yd) Discharge Instruction: Apply over Kerlix as directed. Compression Stockings Add-Ons Wound #20 (Malleolus) Wound Laterality: Left, Lateral Cleanser Soap and Water Discharge Instruction: May shower and wash wound with dial antibacterial soap and water prior to dressing change. Peri-Wound Care Sween Lotion (Moisturizing lotion) Discharge Instruction: Apply moisturizing lotion as directed Topical Primary Dressing Santyl Ointment Discharge Instruction: Apply to wound bed as instructed Secondary Dressing Woven Gauze Sponge, Non-Sterile 4x4 in Discharge Instruction: Apply over primary dressing as directed. ABD Pad, 8x10 Discharge Instruction: Apply over primary dressing as directed. Secured With Compression  Wrap Kerlix Roll 4.5x3.1 (in/yd) Discharge Instruction: Apply Kerlix and Coban compression as directed. Coban Self-Adherent Wrap 4x5 (in/yd) Discharge Instruction: Apply over Kerlix as directed. Compression Stockings Add-Ons Wound #23 (Lower Leg) Wound Laterality: Right, Posterior Cleanser Soap and Water Discharge Instruction: May shower and wash wound with dial antibacterial soap and water prior to dressing change. Peri-Wound Care Sween Lotion (Moisturizing lotion) Discharge Instruction: Apply moisturizing lotion as directed Topical Gentamicin Discharge Instruction: Under Iodoflex Primary Dressing IODOFLEX 0.9% Cadexomer Iodine Pad 4x6 cm Discharge Instruction: Apply Iodoflex or Iodosorb to wound bed as instructed Secondary Dressing Woven Gauze Sponge, Non-Sterile 4x4 in Discharge Instruction: Apply over primary dressing as directed. ABD Pad, 8x10 Discharge Instruction: Apply over primary dressing as directed. Secured With Compression Wrap Kerlix Roll 4.5x3.1 (in/yd) Discharge Instruction: Apply Kerlix and Coban compression as directed. Coban Self-Adherent Wrap 4x5 (in/yd) Discharge Instruction: Apply over Kerlix as directed. Compression Stockings Add-Ons Wound #24 (Lower Leg) Wound Laterality: Right, Anterior Cleanser Soap and Water Discharge Instruction: May shower and wash wound with dial antibacterial soap and water prior to dressing change. Peri-Wound Care Sween Lotion (Moisturizing lotion) Discharge Instruction: Apply moisturizing lotion as directed Topical Primary Dressing IODOFLEX 0.9% Cadexomer Iodine Pad 4x6 cm Discharge Instruction: Apply Iodoflex or Iodosorb to wound bed as instructed Secondary Dressing Woven Gauze Sponge, Non-Sterile 4x4 in Discharge Instruction: Apply over primary dressing as directed. ABD Pad, 8x10 Discharge Instruction: Apply over primary dressing as directed. Secured With Compression Wrap Kerlix Roll 4.5x3.1 (in/yd) Discharge  Instruction: Apply Kerlix and Coban compression as directed. Coban Self-Adherent Wrap 4x5 (in/yd) Discharge Instruction: Apply over Kerlix as directed. Compression Stockings Add-Ons Wound #25 (Foot) Wound Laterality: Left, Lateral Cleanser Soap and Water Discharge Instruction: May shower and wash wound with dial antibacterial soap and water prior to dressing change. Peri-Wound Care Sween Lotion (Moisturizing lotion) Discharge Instruction: Apply moisturizing lotion as directed Topical Primary Dressing Santyl Ointment Discharge Instruction: Apply to wound bed as instructed Secondary Dressing Woven Gauze Sponge, Non-Sterile 4x4 in Discharge Instruction: Apply over  primary dressing as directed. ABD Pad, 8x10 Discharge Instruction: Apply over primary dressing as directed. Secured With Compression Wrap Kerlix Roll 4.5x3.1 (in/yd) Discharge Instruction: Apply Kerlix and Coban compression as directed. Coban Self-Adherent Wrap 4x5 (in/yd) Discharge Instruction: Apply over Kerlix as directed. Compression Stockings Add-Ons Wound #26 (Lower Leg) Wound Laterality: Right, Lateral Cleanser Soap and Water Discharge Instruction: May shower and wash wound with dial antibacterial soap and water prior to dressing change. Peri-Wound Care Sween Lotion (Moisturizing lotion) Discharge Instruction: Apply moisturizing lotion as directed Topical Primary Dressing IODOFLEX 0.9% Cadexomer Iodine Pad 4x6 cm Discharge Instruction: Apply Iodoflex or Iodosorb to wound bed as instructed Secondary Dressing Woven Gauze Sponge, Non-Sterile 4x4 in Discharge Instruction: Apply over primary dressing as directed. ABD Pad, 8x10 Discharge Instruction: Apply over primary dressing as directed. Secured With Compression Wrap Kerlix Roll 4.5x3.1 (in/yd) Discharge Instruction: Apply Kerlix and Coban compression as directed. Coban Self-Adherent Wrap 4x5 (in/yd) Discharge Instruction: Apply over Kerlix as  directed. Compression Stockings Add-Ons Wound #27 (Lower Leg) Wound Laterality: Right, Anterior, Distal Cleanser Soap and Water Discharge Instruction: May shower and wash wound with dial antibacterial soap and water prior to dressing change. Peri-Wound Care Sween Lotion (Moisturizing lotion) Discharge Instruction: Apply moisturizing lotion as directed Topical Primary Dressing IODOFLEX 0.9% Cadexomer Iodine Pad 4x6 cm Discharge Instruction: Apply Iodoflex or Iodosorb to wound bed as instructed Secondary Dressing Woven Gauze Sponge, Non-Sterile 4x4 in Discharge Instruction: Apply over primary dressing as directed. ABD Pad, 8x10 Discharge Instruction: Apply over primary dressing as directed. Secured With Compression Wrap Kerlix Roll 4.5x3.1 (in/yd) Discharge Instruction: Apply Kerlix and Coban compression as directed. Coban Self-Adherent Wrap 4x5 (in/yd) Discharge Instruction: Apply over Kerlix as directed. Compression Stockings Add-Ons Wound #28 (Lower Leg) Wound Laterality: Left, Lateral Cleanser Peri-Wound Care Topical Primary Dressing Secondary Dressing Secured With Compression Wrap Compression Stockings Add-Ons Wound #29 (Lower Leg) Wound Laterality: Right, Lateral, Proximal Cleanser Soap and Water Discharge Instruction: May shower and wash wound with dial antibacterial soap and water prior to dressing change. Peri-Wound Care Sween Lotion (Moisturizing lotion) Discharge Instruction: Apply moisturizing lotion as directed Topical Primary Dressing IODOFLEX 0.9% Cadexomer Iodine Pad 4x6 cm Discharge Instruction: Apply Iodoflex or Iodosorb to wound bed as instructed Secondary Dressing Woven Gauze Sponge, Non-Sterile 4x4 in Discharge Instruction: Apply over primary dressing as directed. ABD Pad, 8x10 Discharge Instruction: Apply over primary dressing as directed. Secured With Compression Wrap Kerlix Roll 4.5x3.1 (in/yd) Discharge Instruction: Apply Kerlix and Coban  compression as directed. Coban Self-Adherent Wrap 4x5 (in/yd) Discharge Instruction: Apply over Kerlix as directed. Compression Stockings Add-Ons Wound #30 (Toe Fourth) Wound Laterality: Right Cleanser Peri-Wound Care Topical Primary Dressing Secondary Dressing Secured With Compression Wrap Compression Stockings Add-Ons Wound #5 (Lower Leg) Wound Laterality: Right, Medial Cleanser Soap and Water Discharge Instruction: May shower and wash wound with dial antibacterial soap and water prior to dressing change. Peri-Wound Care Sween Lotion (Moisturizing lotion) Discharge Instruction: Apply moisturizing lotion as directed Topical Primary Dressing IODOFLEX 0.9% Cadexomer Iodine Pad 4x6 cm Discharge Instruction: Apply Iodoflex or Iodosorb to wound bed as instructed Secondary Dressing Woven Gauze Sponge, Non-Sterile 4x4 in Discharge Instruction: Apply over primary dressing as directed. ABD Pad, 8x10 Discharge Instruction: Apply over primary dressing as directed. Secured With Compression Wrap Kerlix Roll 4.5x3.1 (in/yd) Discharge Instruction: Apply Kerlix and Coban compression as directed. Coban Self-Adherent Wrap 4x5 (in/yd) Discharge Instruction: Apply over Kerlix as directed. Compression Stockings Add-Ons Electronic Signature(s) Signed: 12/26/2020 5:55:20 PM By: Deon Pilling Signed: 12/27/2020 2:27:07 PM By: Linton Ham  MD Entered By: Linton Ham on 12/26/2020 16:36:37 -------------------------------------------------------------------------------- Multi-Disciplinary Care Plan Details Patient Name: Date of Service: [name] 12/26/2020 2:00 PM Medical Record Number: [id number] Patient Account Number: [id number] Date of Birth/Sex: Treating RN: November 26, 1951 (69 y.o. [name] [name] Primary Care Shanetra Blumenstock: [name] Other Clinician: Referring Konner Saiz: Treating Tiki Tucciarone/Extender: [name] Kelp in Treatment: 457 Active  Inactive Wound/Skin Impairment Nursing Diagnoses: Impaired tissue integrity Knowledge deficit related to ulceration/compromised skin integrity Goals: Patient/caregiver will verbalize understanding of skin care regimen Date Initiated: 10/11/2016 Target Resolution Date: 01/06/2021 Goal Status: Active Interventions: Assess patient/caregiver ability to obtain necessary supplies Assess patient/caregiver ability to perform ulcer/skin care regimen upon admission and as needed Assess ulceration(s) every visit Provide education on ulcer and skin care Treatment Activities: Skin care regimen initiated : 10/11/2016 Topical wound management initiated : 10/11/2016 Notes: Electronic Signature(s) Signed: 12/26/2020 5:55:15 PM By: Baruch Gouty RN, BSN Entered By: Baruch Gouty on 12/26/2020 15:23:16 -------------------------------------------------------------------------------- Pain Assessment Details Patient Name: Date of Service: [name] 12/26/2020 2:00 PM Medical Record Number: [id number] Patient Account Number: [id number] Date of Birth/Sex: Treating RN: 16-Mar-1951 (69 y.o. [name] Primary Care Chauncy Mangiaracina: [name] Other Clinician: Referring Jeffren Dombek: Treating Mi Balla/Extender: [name] Kelp in Treatment: [phone] Active Problems Location of Pain Severity and Description of Pain Patient Has Paino Yes Site Locations Pain Location: Pain in Ulcers With Dressing Change: Yes Duration of the Pain. Constant / Intermittento Constant Rate the pain. Current Pain Level: 5 Worst Pain Level: 10 Least Pain Level: 3 Character of Pain Describe the Pain: Other: "just pain" Pain Management and Medication Current Pain Management: Medication: Yes Is the Current Pain Management Adequate: Adequate How does your wound impact your activities of daily livingo Sleep: Yes Bathing: No Appetite: No Relationship With Others: No Bladder Continence:  No Emotions: Yes Bowel Continence: No Work: No Toileting: No Drive: No Dressing: No Hobbies: Yes Electronic Signature(s) Signed: 12/26/2020 5:55:15 PM By: Baruch Gouty RN, BSN Signed: 12/26/2020 5:55:20 PM By: Deon Pilling Entered By: Baruch Gouty on 12/26/2020 14:52:56 -------------------------------------------------------------------------------- Patient/Caregiver Education Details Patient Name: Date of Service: [name] 12/27/2021andnbsp2:00 PM Medical Record Number: [id number] Patient Account Number: [id number] Date of Birth/Gender: Treating RN: 1951-12-14 (69 y.o. [name] [name] Primary Care Physician: [name] Other Clinician: Referring Physician: Treating Physician/Extender: [name] Kelp in Treatment: [phone] Education Assessment Education Provided To: Patient Education Topics Provided Infection: Methods: Explain/Verbal Responses: Reinforcements needed, State content correctly Venous: Methods: Explain/Verbal Responses: Reinforcements needed, State content correctly Wound/Skin Impairment: Methods: Explain/Verbal Responses: Reinforcements needed, State content correctly Electronic Signature(s) Signed: 12/26/2020 5:55:15 PM By: Baruch Gouty RN, BSN Entered By: Baruch Gouty on 12/26/2020 15:23:58 -------------------------------------------------------------------------------- Wound Assessment Details Patient Name: Date of Service: [name], [name] 12/26/2020 2:00 PM Medical Record Number: [id number] Patient Account Number: [id number] Date of Birth/Sex: Treating RN: Dec 09, 1951 (69 y.o. 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 Wound Open Wounding Event: Pressure  Injury Status: Date Acquired: 01/04/2020 Comorbid Anemia, Hypertension, Peripheral Arterial Disease, Peripheral Weeks Of Treatment: 51 History: Venous Disease, Raynauds, Scleroderma, Rheumatoid Arthritis, Clustered Wound: No Osteoarthritis Wound Measurements Length: (cm) 0.5 Width: (cm) 1 Depth: (cm) 0.4 Area: (cm) 0.393 Volume: (cm) 0.157 % Reduction in Area: 96.9% % Reduction in  Volume: 87.5% Epithelialization: Small (1-33%) Tunneling: No Undermining: No Wound Description Classification: Category/Stage III Wound Margin: Distinct, outline attached Exudate Amount: Small Exudate Type: Serous Exudate Color: amber Foul Odor After Cleansing: No Slough/Fibrino Yes Wound Bed Granulation Amount: None Present (0%) Exposed Structure Necrotic Amount: Large (67-100%) Fascia Exposed: No Necrotic Quality: Adherent Slough Fat Layer (Subcutaneous Tissue) Exposed: Yes Tendon Exposed: No Muscle Exposed: No Joint Exposed: No Bone Exposed: No Treatment Notes Wound #16 (Calcaneus) Wound Laterality: Right Cleanser Soap and Water Discharge Instruction: May shower and wash wound with dial antibacterial soap and water prior to dressing change. Peri-Wound Care Sween Lotion (Moisturizing lotion) Discharge Instruction: Apply moisturizing lotion as directed Topical Primary Dressing IODOFLEX 0.9% Cadexomer Iodine Pad 4x6 cm Discharge Instruction: Apply Iodoflex or Iodosorb to wound bed as instructed Secondary Dressing Woven Gauze Sponge, Non-Sterile 4x4 in Discharge Instruction: Apply over primary dressing as directed. ABD Pad, 8x10 Discharge Instruction: Apply over primary dressing as directed. Secured With Compression Wrap Kerlix Roll 4.5x3.1 (in/yd) Discharge Instruction: Apply Kerlix and Coban compression as directed. Coban Self-Adherent Wrap 4x5 (in/yd) Discharge Instruction: Apply over Kerlix as directed. Compression Stockings Add-Ons Electronic Signature(s) Signed: 12/26/2020  5:55:15 PM By: Baruch Gouty RN, BSN Signed: 12/26/2020 5:55:20 PM By: Deon Pilling Entered By: Baruch Gouty on 12/26/2020 14:59:11 -------------------------------------------------------------------------------- Wound Assessment Details Patient Name: Date of Service: [name], [name] 12/26/2020 2:00 PM Medical Record Number: [id number] Patient Account Number: [id number] Date of Birth/Sex: Treating RN: [birth year] (69 y.o. [name] [name], Meta.Reding Primary Care Deisi Salonga: [name] Other Clinician: Referring Dmarco Baldus: Treating Sheyla Zaffino/Extender: [name] Kelp in Treatment: [place] Wound Status Wound Number: 19 Primary Venous Leg Ulcer Etiology: Etiology: Wound Location: Right, Lateral Malleolus Wound Open Wounding Event: Gradually Appeared Status: Date Acquired: 05/02/2020 Comorbid Anemia, Hypertension, Peripheral Arterial Disease, Peripheral Weeks Of Treatment: 34 History: Venous Disease, Raynauds, Scleroderma, Rheumatoid Arthritis, Clustered Wound: No Osteoarthritis Wound Measurements Length: (cm) 0.5 Width: (cm) 0.2 Depth: (cm) 0.2 Area: (cm) 0.079 Volume: (cm) 0.016 % Reduction in Area: 44% % Reduction in Volume: 42.9% Epithelialization: Small (1-33%) Tunneling: No Undermining: No Wound Description Classification: Full Thickness Without Exposed Support Structures Wound Margin: Distinct, outline attached Exudate Amount: Small Exudate Type: Purulent Exudate Color: yellow, brown, green Foul Odor After Cleansing: No Slough/Fibrino Yes Wound Bed Granulation Amount: Medium (34-66%) Exposed Structure Granulation Quality: Pink Fascia Exposed: No Necrotic Amount: Medium (34-66%) Fat Layer (Subcutaneous Tissue) Exposed: Yes Necrotic Quality: Adherent Slough Tendon Exposed: No Muscle Exposed: No Joint Exposed: No Bone Exposed: No Treatment Notes Wound #19 (Malleolus) Wound Laterality: Right, Lateral Cleanser Soap and Water Discharge  Instruction: May shower and wash wound with dial antibacterial soap and water prior to dressing change. Peri-Wound Care Sween Lotion (Moisturizing lotion) Discharge Instruction: Apply moisturizing lotion as directed Topical Primary Dressing IODOFLEX 0.9% Cadexomer Iodine Pad 4x6 cm Discharge Instruction: Apply Iodoflex or Iodosorb to wound bed as instructed Secondary Dressing Woven Gauze Sponge, Non-Sterile 4x4 in Discharge Instruction: Apply over primary dressing as directed. ABD Pad, 8x10 Discharge Instruction: Apply over primary dressing as directed. Secured With Compression Wrap Kerlix Roll 4.5x3.1 (in/yd) Discharge Instruction: Apply Kerlix and Coban compression as directed. Coban Self-Adherent Wrap 4x5 (in/yd) Discharge Instruction: Apply over Kerlix as directed. Compression Stockings Add-Ons Electronic Signature(s) Signed: 12/26/2020 5:55:15 PM By: Baruch Gouty RN, BSN Signed: 12/26/2020 5:55:20 PM By: Deon Pilling Entered By: Baruch Gouty on 12/26/2020 15:02:42 -------------------------------------------------------------------------------- Wound Assessment Details Patient Name: Date of Service: [name], JO A N F. 12/26/2020 2:00 PM  Medical Record Number: [id number] Patient Account Number: [id number] Date of Birth/Sex: Treating RN: [birth year] (69 y.o. [name] [name], Meta.Reding Primary Care Carizma Dunsworth: [name] Other Clinician: Referring Sharan Mcenaney: Treating Hernandez Losasso/Extender: [name] Kelp in Treatment: [place] Wound Status Wound Number: 20 Primary Arterial Insufficiency Ulcer Etiology: Wound Location: Left, Lateral Malleolus Wound Open Wounding Event: Gradually Appeared Status: Date Acquired: 05/02/2020 Comorbid Anemia, Hypertension, Peripheral Arterial Disease, Peripheral Weeks Of Treatment: 34 History: Venous Disease, Raynauds, Scleroderma, Rheumatoid Arthritis, Clustered Wound: No Osteoarthritis Wound Measurements Length: (cm) 0.9 Width:  (cm) 0.7 Depth: (cm) 0.1 Area: (cm) 0.495 Volume: (cm) 0.049 % Reduction in Area: -12.5% % Reduction in Volume: 44.3% Epithelialization: Small (1-33%) Tunneling: No Undermining: No Wound Description Classification: Full Thickness Without Exposed Support Structures Wound Margin: Flat and Intact Exudate Amount: Small Exudate Type: Serous Exudate Color: amber Foul Odor After Cleansing: No Slough/Fibrino Yes Wound Bed Granulation Amount: None Present (0%) Exposed Structure Necrotic Amount: Large (67-100%) Fascia Exposed: No Necrotic Quality: Adherent Slough Fat Layer (Subcutaneous Tissue) Exposed: Yes Tendon Exposed: No Muscle Exposed: No Joint Exposed: No Bone Exposed: No Treatment Notes Wound #20 (Malleolus) Wound Laterality: Left, Lateral Cleanser Soap and Water Discharge Instruction: May shower and wash wound with dial antibacterial soap and water prior to dressing change. Peri-Wound Care Sween Lotion (Moisturizing lotion) Discharge Instruction: Apply moisturizing lotion as directed Topical Primary Dressing Santyl Ointment Discharge Instruction: Apply to wound bed as instructed Secondary Dressing Woven Gauze Sponge, Non-Sterile 4x4 in Discharge Instruction: Apply over primary dressing as directed. ABD Pad, 8x10 Discharge Instruction: Apply over primary dressing as directed. Secured With Compression Wrap Kerlix Roll 4.5x3.1 (in/yd) Discharge Instruction: Apply Kerlix and Coban compression as directed. Coban Self-Adherent Wrap 4x5 (in/yd) Discharge Instruction: Apply over Kerlix as directed. Compression Stockings Add-Ons Electronic Signature(s) Signed: 12/26/2020 5:55:15 PM By: Baruch Gouty RN, BSN Signed: 12/26/2020 5:55:20 PM By: Deon Pilling Entered By: Baruch Gouty on 12/26/2020 15:03:33 -------------------------------------------------------------------------------- Wound Assessment Details Patient Name: Date of Service: [name], [name]  12/26/2020 2:00 PM Medical Record Number: [id number] Patient Account Number: [id number] Date of Birth/Sex: Treating RN: [birth year] (68 y.o. [name] [name], Meta.Reding Primary Care Aalijah Mims: [name] Other Clinician: Referring Tniyah Nakagawa: Treating Askia Hazelip/Extender: [name] Kelp in Treatment: [place] Wound Status Wound Number: 23 Primary Venous Leg Ulcer Etiology: Wound Location: Right, Posterior Lower Leg Wound Open Wounding Event: Gradually Appeared Status: Date Acquired: 08/15/2020 Comorbid Anemia, Hypertension, Peripheral Arterial Disease, Peripheral Weeks Of Treatment: 19 History: Venous Disease, Raynauds, Scleroderma, Rheumatoid Arthritis, Clustered Wound: Yes Osteoarthritis Wound Measurements Length: (cm) 8.5 Width: (cm) 4.5 Depth: (cm) 0.3 Area: (cm) 30.041 Volume: (cm) 9.012 % Reduction in Area: -2575.1% % Reduction in Volume: -7946.4% Epithelialization: None Tunneling: No Undermining: No Wound Description Classification: Full Thickness Without Exposed Support Structures Wound Margin: Distinct, outline attached Exudate Amount: Large Exudate Type: Purulent Exudate Color: yellow, brown, green Foul Odor After Cleansing: Yes Due to Product Use: No Slough/Fibrino Yes Wound Bed Granulation Amount: None Present (0%) Exposed Structure Necrotic Amount: Large (67-100%) Fascia Exposed: No Necrotic Quality: Eschar, Adherent Slough Fat Layer (Subcutaneous Tissue) Exposed: Yes Tendon Exposed: No Muscle Exposed: No Joint Exposed: No Bone Exposed: No Treatment Notes Wound #23 (Lower Leg) Wound Laterality: Right, Posterior Cleanser Soap and Water Discharge Instruction: May shower and wash wound with dial antibacterial soap and water prior to dressing change. Peri-Wound Care Sween Lotion (Moisturizing lotion) Discharge Instruction: Apply moisturizing lotion as directed Topical Gentamicin Discharge Instruction: Under Iodoflex Primary Dressing IODOFLEX  0.9% Cadexomer Iodine  Pad 4x6 cm Discharge Instruction: Apply Iodoflex or Iodosorb to wound bed as instructed Secondary Dressing Woven Gauze Sponge, Non-Sterile 4x4 in Discharge Instruction: Apply over primary dressing as directed. ABD Pad, 8x10 Discharge Instruction: Apply over primary dressing as directed. Secured With Compression Wrap Kerlix Roll 4.5x3.1 (in/yd) Discharge Instruction: Apply Kerlix and Coban compression as directed. Coban Self-Adherent Wrap 4x5 (in/yd) Discharge Instruction: Apply over Kerlix as directed. Compression Stockings Add-Ons Electronic Signature(s) Signed: 12/26/2020 5:55:15 PM By: Baruch Gouty RN, BSN Signed: 12/26/2020 5:55:20 PM By: Deon Pilling Entered By: Baruch Gouty on 12/26/2020 15:04:29 -------------------------------------------------------------------------------- Wound Assessment Details Patient Name: Date of Service: [name], [name] 12/26/2020 2:00 PM Medical Record Number: [id number] Patient Account Number: [id number] Date of Birth/Sex: Treating RN: 1951-06-28 (69 y.o. [name] [name], Meta.Reding Primary Care Imer Foxworth: [name] Other Clinician: Referring Alvey Brockel: Treating Konnar Ben/Extender: [name] Kelp in Treatment: [place] Wound Status Wound Number: 24 Primary Venous Leg Ulcer Etiology: Wound Location: Right, Anterior Lower Leg Wound Open Wounding Event: Gradually Appeared Status: Date Acquired: 10/10/2020 Comorbid Anemia, Hypertension, Peripheral Arterial Disease, Peripheral Weeks Of Treatment: 11 History: Venous Disease, Raynauds, Scleroderma, Rheumatoid Arthritis, Clustered Wound: No Osteoarthritis Wound Measurements Length: (cm) 1.6 Width: (cm) 1 Depth: (cm) 0.2 Area: (cm) 1.257 Volume: (cm) 0.251 % Reduction in Area: -3.9% % Reduction in Volume: 30.9% Epithelialization: Small (1-33%) Tunneling: No Undermining: No Wound Description Classification: Full Thickness Without Exposed Support  Structures Wound Margin: Flat and Intact Exudate Amount: Medium Exudate Type: Serous Exudate Color: amber Foul Odor After Cleansing: No Slough/Fibrino Yes Wound Bed Granulation Amount: None Present (0%) Exposed Structure Necrotic Amount: Large (67-100%) Fascia Exposed: No Necrotic Quality: Adherent Slough Fat Layer (Subcutaneous Tissue) Exposed: Yes Tendon Exposed: No Muscle Exposed: No Joint Exposed: No Bone Exposed: No Treatment Notes Wound #24 (Lower Leg) Wound Laterality: Right, Anterior Cleanser Soap and Water Discharge Instruction: May shower and wash wound with dial antibacterial soap and water prior to dressing change. Peri-Wound Care Sween Lotion (Moisturizing lotion) Discharge Instruction: Apply moisturizing lotion as directed Topical Primary Dressing IODOFLEX 0.9% Cadexomer Iodine Pad 4x6 cm Discharge Instruction: Apply Iodoflex or Iodosorb to wound bed as instructed Secondary Dressing Woven Gauze Sponge, Non-Sterile 4x4 in Discharge Instruction: Apply over primary dressing as directed. ABD Pad, 8x10 Discharge Instruction: Apply over primary dressing as directed. Secured With Compression Wrap Kerlix Roll 4.5x3.1 (in/yd) Discharge Instruction: Apply Kerlix and Coban compression as directed. Coban Self-Adherent Wrap 4x5 (in/yd) Discharge Instruction: Apply over Kerlix as directed. Compression Stockings Add-Ons Electronic Signature(s) Signed: 12/26/2020 5:55:15 PM By: Baruch Gouty RN, BSN Signed: 12/26/2020 5:55:20 PM By: Deon Pilling Entered By: Baruch Gouty on 12/26/2020 15:05:38 -------------------------------------------------------------------------------- Wound Assessment Details Patient Name: Date of Service: [name], [name] 12/26/2020 2:00 PM Medical Record Number: [id number] Patient Account Number: [id number] Date of Birth/Sex: Treating RN: 10-Feb-1951 (69 y.o. [name] [name], Meta.Reding Primary Care Nolin Grell: [name] Other  Clinician: Referring Deryck Hippler: Treating Terianne Thaker/Extender: [name] Kelp in Treatment: [place] Wound Status Wound Number: 25 Primary Venous Leg Ulcer Etiology: Wound Location: Left, Lateral Foot Wound Open Wounding Event: Gradually Appeared Status: Date Acquired: 10/10/2020 Comorbid Anemia, Hypertension, Peripheral Arterial Disease, Peripheral Weeks Of Treatment: 11 History: Venous Disease, Raynauds, Scleroderma, Rheumatoid Arthritis, Clustered Wound: No Osteoarthritis Wound Measurements Length: (cm) 1 Width: (cm) 0.8 Depth: (cm) 0.1 Area: (cm) 0.628 Volume: (cm) 0.063 % Reduction in Area: -66.6% % Reduction in Volume: -65.8% Epithelialization: Small (1-33%) Tunneling: No Undermining: No Wound Description Classification: Full Thickness Without  Exposed Support Structures Wound Margin: Flat and Intact Exudate Amount: Small Exudate Type: Serous Exudate Color: amber Foul Odor After Cleansing: No Slough/Fibrino Yes Wound Bed Granulation Amount: None Present (0%) Exposed Structure Necrotic Amount: Large (67-100%) Fascia Exposed: No Necrotic Quality: Adherent Slough Fat Layer (Subcutaneous Tissue) Exposed: Yes Tendon Exposed: No Muscle Exposed: No Joint Exposed: No Bone Exposed: No Treatment Notes Wound #25 (Foot) Wound Laterality: Left, Lateral Cleanser Soap and Water Discharge Instruction: May shower and wash wound with dial antibacterial soap and water prior to dressing change. Peri-Wound Care Sween Lotion (Moisturizing lotion) Discharge Instruction: Apply moisturizing lotion as directed Topical Primary Dressing Santyl Ointment Discharge Instruction: Apply to wound bed as instructed Secondary Dressing Woven Gauze Sponge, Non-Sterile 4x4 in Discharge Instruction: Apply over primary dressing as directed. ABD Pad, 8x10 Discharge Instruction: Apply over primary dressing as directed. Secured With Compression Wrap Kerlix Roll 4.5x3.1  (in/yd) Discharge Instruction: Apply Kerlix and Coban compression as directed. Coban Self-Adherent Wrap 4x5 (in/yd) Discharge Instruction: Apply over Kerlix as directed. Compression Stockings Add-Ons Electronic Signature(s) Signed: 12/26/2020 5:55:15 PM By: Baruch Gouty RN, BSN Signed: 12/26/2020 5:55:20 PM By: Deon Pilling Entered By: Baruch Gouty on 12/26/2020 15:06:31 -------------------------------------------------------------------------------- Wound Assessment Details Patient Name: Date of Service: [name], [name] 12/26/2020 2:00 PM Medical Record Number: [id number] Patient Account Number: [id number] Date of Birth/Sex: Treating RN: 02-09-1951 (69 y.o. [name] [name], Meta.Reding Primary Care Felipe Paluch: Other Clinician: [name] Referring Alanys Godino: Treating Arvetta Araque/Extender: [name] Kelp in Treatment: [place] Wound Status Wound Number: 26 Primary Venous Leg Ulcer Etiology: Wound Location: Right, Lateral Lower Leg Wound Open Wounding Event: Gradually Appeared Status: Date Acquired: 10/27/2020 Comorbid Anemia, Hypertension, Peripheral Arterial Disease, Peripheral Weeks Of Treatment: 8 History: Venous Disease, Raynauds, Scleroderma, Rheumatoid Arthritis, Clustered Wound: No Osteoarthritis Wound Measurements Length: (cm) 12 Width: (cm) 5 Depth: (cm) 0.1 Area: (cm) 47.124 Volume: (cm) 4.712 % Reduction in Area: -9420% % Reduction in Volume: -3083.8% Epithelialization: Small (1-33%) Tunneling: No Undermining: No Wound Description Classification: Full Thickness Without Exposed Support Structures Wound Margin: Flat and Intact Exudate Amount: Large Exudate Type: Purulent Exudate Color: yellow, brown, green Foul Odor After Cleansing: Yes Due to Product Use: No Slough/Fibrino Yes Wound Bed Granulation Amount: None Present (0%) Exposed Structure Necrotic Amount: Large (67-100%) Fascia Exposed: No Necrotic Quality: Eschar Fat Layer  (Subcutaneous Tissue) Exposed: Yes Tendon Exposed: No Muscle Exposed: No Joint Exposed: No Bone Exposed: No Treatment Notes Wound #26 (Lower Leg) Wound Laterality: Right, Lateral Cleanser Soap and Water Discharge Instruction: May shower and wash wound with dial antibacterial soap and water prior to dressing change. Peri-Wound Care Sween Lotion (Moisturizing lotion) Discharge Instruction: Apply moisturizing lotion as directed Topical Primary Dressing IODOFLEX 0.9% Cadexomer Iodine Pad 4x6 cm Discharge Instruction: Apply Iodoflex or Iodosorb to wound bed as instructed Secondary Dressing Woven Gauze Sponge, Non-Sterile 4x4 in Discharge Instruction: Apply over primary dressing as directed. ABD Pad, 8x10 Discharge Instruction: Apply over primary dressing as directed. Secured With Compression Wrap Kerlix Roll 4.5x3.1 (in/yd) Discharge Instruction: Apply Kerlix and Coban compression as directed. Coban Self-Adherent Wrap 4x5 (in/yd) Discharge Instruction: Apply over Kerlix as directed. Compression Stockings Add-Ons Electronic Signature(s) Signed: 12/26/2020 5:55:15 PM By: Baruch Gouty RN, BSN Signed: 12/26/2020 5:55:20 PM By: Deon Pilling Entered By: Baruch Gouty on 12/26/2020 15:11:18 -------------------------------------------------------------------------------- Wound Assessment Details Patient Name: Date of Service: [name], [name] 12/26/2020 2:00 PM Medical Record Number: [id number] Patient Account Number: [id number] Date of Birth/Sex: Treating RN: [birth year] (69  y.o. [name] [name], Meta.Reding Primary Care Jakye Mullens: [name] Other Clinician: Referring Deborha Moseley: Treating Delania Ferg/Extender: [name] Kelp in Treatment: [place] Wound Status Wound Number: 27 Primary Venous Leg Ulcer Etiology: Wound Location: Right, Distal, Anterior Lower Leg Wound Open Wounding Event: Gradually Appeared Status: Date Acquired: 11/07/2020 Comorbid Anemia,  Hypertension, Peripheral Arterial Disease, Peripheral Weeks Of Treatment: 7 History: Venous Disease, Raynauds, Scleroderma, Rheumatoid Arthritis, Clustered Wound: No Osteoarthritis Wound Measurements Length: (cm) 2 Width: (cm) 0.6 Depth: (cm) 0.1 Area: (cm) 0.942 Volume: (cm) 0.094 % Reduction in Area: -232.9% % Reduction in Volume: -235.7% Epithelialization: None Tunneling: No Undermining: No Wound Description Classification: Full Thickness Without Exposed Support Structures Wound Margin: Flat and Intact Exudate Amount: Medium Exudate Type: Serous Exudate Color: amber Foul Odor After Cleansing: No Slough/Fibrino Yes Wound Bed Granulation Amount: None Present (0%) Exposed Structure Necrotic Amount: Large (67-100%) Fascia Exposed: No Necrotic Quality: Adherent Slough Fat Layer (Subcutaneous Tissue) Exposed: Yes Tendon Exposed: No Muscle Exposed: No Joint Exposed: No Bone Exposed: No Treatment Notes Wound #27 (Lower Leg) Wound Laterality: Right, Anterior, Distal Cleanser Soap and Water Discharge Instruction: May shower and wash wound with dial antibacterial soap and water prior to dressing change. Peri-Wound Care Sween Lotion (Moisturizing lotion) Discharge Instruction: Apply moisturizing lotion as directed Topical Primary Dressing IODOFLEX 0.9% Cadexomer Iodine Pad 4x6 cm Discharge Instruction: Apply Iodoflex or Iodosorb to wound bed as instructed Secondary Dressing Woven Gauze Sponge, Non-Sterile 4x4 in Discharge Instruction: Apply over primary dressing as directed. ABD Pad, 8x10 Discharge Instruction: Apply over primary dressing as directed. Secured With Compression Wrap Kerlix Roll 4.5x3.1 (in/yd) Discharge Instruction: Apply Kerlix and Coban compression as directed. Coban Self-Adherent Wrap 4x5 (in/yd) Discharge Instruction: Apply over Kerlix as directed. Compression Stockings Add-Ons Electronic Signature(s) Signed: 12/26/2020 5:55:15 PM By: Baruch Gouty RN, BSN Signed: 12/26/2020 5:55:20 PM By: Deon Pilling Entered By: Baruch Gouty on 12/26/2020 15:12:06 -------------------------------------------------------------------------------- Wound Assessment Details Patient Name: Date of Service: [name], [name] 12/26/2020 2:00 PM Medical Record Number: [id number] Patient Account Number: [id number] Date of Birth/Sex: Treating RN: Aug 14, 1951 (69 y.o. [name] [name], Meta.Reding Primary Care Ed Mandich: [name] Other Clinician: Referring Shanley Furlough: Treating Tel Hevia/Extender: [name] Kelp in Treatment: [place] Wound Status Wound Number: 28 Primary Arterial Insufficiency Ulcer Etiology: Wound Location: Left, Lateral Lower Leg Wound Open Wounding Event: Trauma Status: Date Acquired: 12/09/2020 Comorbid Anemia, Hypertension, Peripheral Arterial Disease, Peripheral Weeks Of Treatment: 1 History: Venous Disease, Raynauds, Scleroderma, Rheumatoid Arthritis, Clustered Wound: No Osteoarthritis Wound Measurements Length: (cm) 0.6 Width: (cm) 0.3 Depth: (cm) 0.1 Area: (cm) 0.141 Volume: (cm) 0.014 % Reduction in Area: 71.5% % Reduction in Volume: 71.4% Epithelialization: Small (1-33%) Tunneling: No Undermining: No Wound Description Classification: Full Thickness Without Exposed Support Structures Wound Margin: Flat and Intact Exudate Amount: Small Exudate Type: Serous Exudate Color: amber Foul Odor After Cleansing: No Slough/Fibrino No Wound Bed Granulation Amount: Large (67-100%) Exposed Structure Granulation Quality: Pink Fascia Exposed: No Necrotic Amount: Small (1-33%) Fat Layer (Subcutaneous Tissue) Exposed: Yes Necrotic Quality: Adherent Slough Tendon Exposed: No Muscle Exposed: No Joint Exposed: No Bone Exposed: No Treatment Notes Wound #28 (Lower Leg) Wound Laterality: Left, Lateral Cleanser Peri-Wound Care Topical Primary Dressing Secondary Dressing Secured With Compression  Wrap Compression Stockings Add-Ons Electronic Signature(s) Signed: 12/26/2020 5:55:15 PM By: Baruch Gouty RN, BSN Signed: 12/26/2020 5:55:20 PM By: Deon Pilling Entered By: Baruch Gouty on 12/26/2020 15:12:48 -------------------------------------------------------------------------------- Wound Assessment Details Patient Name: Date of Service: [name], JO A N F.  12/26/2020 2:00 PM Medical Record Number: [id number] Patient Account Number: [id number] Date of Birth/Sex: Treating RN: June 02, 1951 (69 y.o. [name] [name], Meta.Reding Primary Care Jaydalee Bardwell: [name] Other Clinician: Referring Aaisha Sliter: Treating Jalee Saine/Extender: [name] Kelp in Treatment: [place] Wound Status Wound Number: 29 Primary Arterial Insufficiency Ulcer Etiology: Wound Location: Right, Proximal, Lateral Lower Leg Wound Open Wounding Event: Gradually Appeared Status: Date Acquired: 12/14/2020 Comorbid Anemia, Hypertension, Peripheral Arterial Disease, Peripheral Weeks Of Treatment: 1 History: Venous Disease, Raynauds, Scleroderma, Rheumatoid Arthritis, Clustered Wound: No Osteoarthritis Wound Measurements Length: (cm) 1 Width: (cm) 1 Depth: (cm) 0.1 Area: (cm) 0.785 Volume: (cm) 0.079 % Reduction in Area: 33.4% % Reduction in Volume: 88.8% Epithelialization: Small (1-33%) Tunneling: No Undermining: No Wound Description Classification: Full Thickness Without Exposed Support Structures Wound Margin: Flat and Intact Exudate Amount: Medium Exudate Type: Purulent Exudate Color: yellow, brown, green Foul Odor After Cleansing: No Slough/Fibrino Yes Wound Bed Granulation Amount: None Present (0%) Exposed Structure Necrotic Amount: Large (67-100%) Fascia Exposed: No Necrotic Quality: Adherent Slough Fat Layer (Subcutaneous Tissue) Exposed: Yes Tendon Exposed: No Muscle Exposed: No Joint Exposed: No Bone Exposed: No Treatment Notes Wound #29 (Lower Leg) Wound Laterality:  Right, Lateral, Proximal Cleanser Soap and Water Discharge Instruction: May shower and wash wound with dial antibacterial soap and water prior to dressing change. Peri-Wound Care Sween Lotion (Moisturizing lotion) Discharge Instruction: Apply moisturizing lotion as directed Topical Primary Dressing IODOFLEX 0.9% Cadexomer Iodine Pad 4x6 cm Discharge Instruction: Apply Iodoflex or Iodosorb to wound bed as instructed Secondary Dressing Woven Gauze Sponge, Non-Sterile 4x4 in Discharge Instruction: Apply over primary dressing as directed. ABD Pad, 8x10 Discharge Instruction: Apply over primary dressing as directed. Secured With Compression Wrap Kerlix Roll 4.5x3.1 (in/yd) Discharge Instruction: Apply Kerlix and Coban compression as directed. Coban Self-Adherent Wrap 4x5 (in/yd) Discharge Instruction: Apply over Kerlix as directed. Compression Stockings Add-Ons Electronic Signature(s) Signed: 12/26/2020 5:55:15 PM By: Baruch Gouty RN, BSN Signed: 12/26/2020 5:55:20 PM By: Deon Pilling Entered By: Baruch Gouty on 12/26/2020 15:13:39 -------------------------------------------------------------------------------- Wound Assessment Details Patient Name: Date of Service: [name], [name] 12/26/2020 2:00 PM Medical Record Number: [id number] Patient Account Number: [id number] Date of Birth/Sex: Treating RN: 1951/07/04 (69 y.o. [name] [name], Meta.Reding Primary Care Lauraine Crespo: [name] Other Clinician: Referring Javontay Vandam: Treating Kiondra Caicedo/Extender: [name] Kelp in Treatment: [place] Wound Status Wound Number: 90 Primary T be determined o Etiology: Wound Location: Right T Fourth oe Wound Open Wounding Event: Gradually Appeared Status: Date Acquired: 12/26/2020 Comorbid Anemia, Hypertension, Peripheral Arterial Disease, Peripheral Weeks Of Treatment: 0 History: Venous Disease, Raynauds, Scleroderma, Rheumatoid Arthritis, Clustered Wound: No  Osteoarthritis Wound Measurements Length: (cm) 2 Width: (cm) 2.8 Depth: (cm) 0.1 Area: (cm) 4.398 Volume: (cm) 0.44 % Reduction in Area: 0% % Reduction in Volume: 0% Epithelialization: None Tunneling: No Undermining: No Wound Description Classification: Full Thickness Without Exposed Support Structures Wound Margin: Flat and Intact Exudate Amount: Medium Exudate Type: Purulent Exudate Color: yellow, brown, green Foul Odor After Cleansing: No Slough/Fibrino Yes Wound Bed Granulation Amount: None Present (0%) Exposed Structure Necrotic Amount: Large (67-100%) Fascia Exposed: No Necrotic Quality: Eschar, Adherent Slough Fat Layer (Subcutaneous Tissue) Exposed: Yes Tendon Exposed: No Muscle Exposed: No Joint Exposed: No Bone Exposed: No Treatment Notes Wound #30 (Toe Fourth) Wound Laterality: Right Cleanser Peri-Wound Care Topical Primary Dressing Secondary Dressing Secured With Compression Wrap Compression Stockings Add-Ons Electronic Signature(s) Signed: 12/26/2020 5:55:15 PM By: Baruch Gouty RN, BSN Signed: 12/26/2020 5:55:20 PM By: Deon Pilling Entered By:  Baruch Gouty on 12/26/2020 15:14:30 -------------------------------------------------------------------------------- Wound Assessment Details Patient Name: Date of Service: [name] 12/26/2020 2:00 PM Medical Record Number: [id number] Patient Account Number: [id number] Date of Birth/Sex: Treating RN: 10/13/1951 (69 y.o. [name] [name], Meta.Reding Primary Care Dimitria Ketchum: [name] Other Clinician: Referring Dajai Wahlert: Treating Presli Fanguy/Extender: [name] Kelp in Treatment: [place] Wound Status Wound Number: 5 Primary Venous Leg Ulcer Etiology: Wound Location: Right, Medial Lower Leg Wound Open Wounding Event: Gradually Appeared Status: Date Acquired: 01/13/2013 Comorbid Anemia, Hypertension, Peripheral Arterial Disease, Peripheral Weeks Of Treatment: 413 History: Venous  Disease, Raynauds, Scleroderma, Rheumatoid Arthritis, Clustered Wound: Yes Osteoarthritis Wound Measurements Length: (cm) 2.1 Width: (cm) 2.5 Depth: (cm) 0.1 Area: (cm) 4.123 Volume: (cm) 0.412 % Reduction in Area: -191.6% % Reduction in Volume: -192.2% Epithelialization: Small (1-33%) Tunneling: No Undermining: No Wound Description Classification: Full Thickness With Exposed Support Structures Wound Margin: Flat and Intact Exudate Amount: Medium Exudate Type: Serous Exudate Color: amber Foul Odor After Cleansing: No Slough/Fibrino Yes Wound Bed Granulation Amount: Small (1-33%) Exposed Structure Granulation Quality: Pink Fascia Exposed: No Necrotic Amount: Large (67-100%) Fat Layer (Subcutaneous Tissue) Exposed: Yes Necrotic Quality: Adherent Slough Tendon Exposed: No Muscle Exposed: No Joint Exposed: No Bone Exposed: No Treatment Notes Wound #5 (Lower Leg) Wound Laterality: Right, Medial Cleanser Soap and Water Discharge Instruction: May shower and wash wound with dial antibacterial soap and water prior to dressing change. Peri-Wound Care Sween Lotion (Moisturizing lotion) Discharge Instruction: Apply moisturizing lotion as directed Topical Primary Dressing IODOFLEX 0.9% Cadexomer Iodine Pad 4x6 cm Discharge Instruction: Apply Iodoflex or Iodosorb to wound bed as instructed Secondary Dressing Woven Gauze Sponge, Non-Sterile 4x4 in Discharge Instruction: Apply over primary dressing as directed. ABD Pad, 8x10 Discharge Instruction: Apply over primary dressing as directed. Secured With Compression Wrap Kerlix Roll 4.5x3.1 (in/yd) Discharge Instruction: Apply Kerlix and Coban compression as directed. Coban Self-Adherent Wrap 4x5 (in/yd) Discharge Instruction: Apply over Kerlix as directed. Compression Stockings Add-Ons Electronic Signature(s) Signed: 12/26/2020 5:55:15 PM By: Baruch Gouty RN, BSN Signed: 12/26/2020 5:55:20 PM By: Deon Pilling Entered  By: Baruch Gouty on 12/26/2020 15:15:46 -------------------------------------------------------------------------------- Vitals Details Patient Name: Date of Service: [name], [name] 12/26/2020 2:00 PM Medical Record Number: [id number] Patient Account Number: [id number] Date of Birth/Sex: Treating RN: 1951/06/28 (69 y.o. [name] [name], Meta.Reding Primary Care Nalia Honeycutt: [name] Other Clinician: Referring Averyana Pillars: Treating Johnhenry Tippin/Extender: [name] Kelp in Treatment: 457 Vital Signs Time Taken: 14:16 Temperature (F): 98.5 Height (in): 68 Pulse (bpm): 96 Weight (lbs): 132 Respiratory Rate (breaths/min): 17 Body Mass Index (BMI): 20.1 Blood Pressure (mmHg): 170/72 Reference Range: 80 - 120 mg / dl Electronic Signature(s) Signed: 12/27/2020 12:40:32 PM By: Sandre Kitty Entered By: Sandre Kitty on 12/26/2020 14:16:30

## 2020-12-27 NOTE — Progress Notes (Signed)
TERRILEE, DUDZIK ([id number]) Visit Report for 12/26/2020 Debridement Details Patient Name: Date of Service: [name] 12/26/2020 2:00 PM Medical Record Number: [id number] Patient Account Number: [id number] Date of Birth/Sex: Treating RN: [birth year] (69 y.o. [name] Primary Care Provider: [name] Other Clinician: Referring Provider: Treating Provider/Extender: [name] in Treatment: 457 Debridement Performed for Assessment: Wound #20 Left,Lateral Malleolus Performed By: Clinician Deon Pilling, RN Debridement Type: Chemical/Enzymatic/Mechanical Agent Used: Santyl Severity of Tissue Pre Debridement: Fat layer exposed Level of Consciousness (Pre-procedure): Awake and Alert Pre-procedure Verification/Time Out No Taken: Bleeding: None Response to Treatment: Procedure was tolerated well Level of Consciousness (Post- Awake and Alert procedure): Post Debridement Measurements of Total Wound Length: (cm) 0.9 Width: (cm) 0.7 Depth: (cm) 0.1 Volume: (cm) 0.049 Character of Wound/Ulcer Post Debridement: Requires Further Debridement Severity of Tissue Post Debridement: Fat layer exposed Post Procedure Diagnosis Same as Pre-procedure Electronic Signature(s) Signed: 12/26/2020 5:55:15 PM By: Baruch Gouty RN, BSN Signed: 12/27/2020 2:27:07 PM By: Linton Ham MD Entered By: Baruch Gouty on 12/26/2020 16:45:21 -------------------------------------------------------------------------------- Debridement Details Patient Name: Date of Service: [name], [name] 12/26/2020 2:00 PM Medical Record Number: [id number] Patient Account Number: [id number] Date of Birth/Sex: Treating RN: 1951/04/23 (69 y.o. [name] Primary Care Provider: [name] Other Clinician: Referring Provider: Treating Provider/Extender: [name] in Treatment: 457 Debridement Performed for Assessment: Wound #25 Left,Lateral  Foot Performed By: Clinician Deon Pilling, RN Debridement Type: Chemical/Enzymatic/Mechanical Agent Used: Santyl Severity of Tissue Pre Debridement: Fat layer exposed Level of Consciousness (Pre-procedure): Awake and Alert Pre-procedure Verification/Time Out No Taken: Bleeding: None Response to Treatment: Procedure was tolerated well Level of Consciousness (Post- Awake and Alert Awake and Alert procedure): Post Debridement Measurements of Total Wound Length: (cm) 1 Width: (cm) 0.8 Depth: (cm) 0.1 Volume: (cm) 0.063 Character of Wound/Ulcer Post Debridement: Requires Further Debridement Severity of Tissue Post Debridement: Fat layer exposed Post Procedure Diagnosis Same as Pre-procedure Electronic Signature(s) Signed: 12/26/2020 5:55:15 PM By: Baruch Gouty RN, BSN Signed: 12/27/2020 2:27:07 PM By: Linton Ham MD Entered By: Baruch Gouty on 12/26/2020 16:46:11 -------------------------------------------------------------------------------- Debridement Details Patient Name: Date of Service: [name], [name] 12/26/2020 2:00 PM Medical Record Number: [id number] Patient Account Number: [id number] Date of Birth/Sex: Treating RN: 08/20/1951 (69 y.o. [name] Primary Care Provider: [name] Other Clinician: Referring Provider: Treating Provider/Extender: [name] in Treatment: 457 Debridement Performed for Assessment: Wound #28 Left,Lateral Lower Leg Performed By: Clinician Deon Pilling, RN Debridement Type: Chemical/Enzymatic/Mechanical Agent Used: Santyl Severity of Tissue Pre Debridement: Fat layer exposed Level of Consciousness (Pre-procedure): Awake and Alert Pre-procedure Verification/Time Out No Taken: Bleeding: None Response to Treatment: Procedure was tolerated well Level of Consciousness (Post- Awake and Alert procedure): Post Debridement Measurements of Total Wound Length: (cm) 0.6 Width: (cm) 0.3 Depth:  (cm) 0.1 Volume: (cm) 0.014 Character of Wound/Ulcer Post Debridement: Requires Further Debridement Severity of Tissue Post Debridement: Fat layer exposed Post Procedure Diagnosis Same as Pre-procedure Electronic Signature(s) Signed: 12/26/2020 5:55:15 PM By: Baruch Gouty RN, BSN Signed: 12/27/2020 2:27:07 PM By: Linton Ham MD Entered By: Baruch Gouty on 12/26/2020 16:47:04 -------------------------------------------------------------------------------- HPI Details Patient Name: Date of Service: [name], [name] 12/26/2020 2:00 PM Medical Record Number: [id number] Patient Account Number: [id number] Date of Birth/Sex: Treating RN: 1951-06-11 (69 y.o. [name] Primary Care Provider: [name] Other Clinician: Referring Provider: Treating Provider/Extender: Linton Ham  Assunta Gambles in Treatment: 457 History of Present Illness HPI Description: this is a patient who initially came to [place] for wounds on the medial malleoli bilaterally as well as her upper medial lower extremities bilaterally. These wounds eventually healed with assistance of Apligraf's bilaterally. While this was occurring she developed the current wound which opened into a fairly substantial wound on the right lower extremity. These are mostly secondary to venous stasis physiology however the patient also has underlying scleroderma, pulmonary hypertension. The wound has been making good progress lately with the Hydrofera Blue-based dressing. 03/17/2015; patient had a arterial evaluation a year ago. Her right ABI was 0.86 left was 1.0. T brachial index was 0.41 on the right 0.45 on the left. Her oe bilateral common femoral artery waveforms were triphasic. Her white popliteal posterior tibial artery and anterior tibial artery waveforms were monophasic with good amplitude. Luteal artery waveforms were biphasic it was felt that her bilateral great toe pressures are of normal although adequate for  tissue healing. 03/24/2015. The condition of this wound is not really improved that. He was covered with as fibrinous surface slough and eschar. This underwent an aggressive debridement with both a curette and scalpel. I still cannot really get down to what I can would consider to be a viable surface. There is no evidence of infection. Previous workup for ischemia roughly a year ago was negative nevertheless I think that continues to be a concern 04/07/15. The patient arrived for application of second Apligraf. Once again the surface of this wound is certainly less viable than I would like for an advanced treatment option. An aggressive debridement was done. She developed some arteriolar bleeding which required that along pressure and silver alginate. 04/21/15: change in the condition of this wound. Once again it is covered in a gelatinous surface slough. After debridement today surface of the wound looks somewhat better but now a heeling surface 05/05/15 Apligraf #4 applied.Still a lot of slough on this wound. 05/19/15 Apligraf #5 applied. Still a lot of slough on this wound I did not aggressively remove this 06/02/15; continued copious amounts of surface slough. This debridement fairly easily. 06/08/2015 -- the last time she had a venous duplex study done was over 3 years ago and the surgery was prior to that. I have recommended that she sees Dr. early for a another opinion regarding a repeat venous duplex and possibly more endovenous ablation of vein stripping of micro-phlebotomies. 06/16/15; wound has a gelatinous surface eschar that the debridement fairly easily to a point. I don't disagree with the venous workup and perhaps even arterial re-evaluation. She is on prednisone 5 mg and continue his medications for her pulmonary artery hypertension I am not sure if the latter have any wound care healing issues I would need to investigate this. 06/23/15 continues with a gelatinous surface eschar with of  fibrinous underlying. What I can see of this wound does not look unhealthy however I just can't get this material which I think is 2 different layers off. Empiric culture done 06/30/15; unfortunately not a lot of change in this wound. A gelatinous surface eschar is easily removed however it has a tight fibrinous surface underneath the. 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 The wound comes back and basically and unchanged state the. She has a gelatinous surface that is more easily removed however there is a tightly fibrinous surface underneath the. There is no evidence of infection. She has a vascular follow-up next  month. I would have to inject her in order to do a more aggressive debridement of this area 07/21/15 the wound is roughly in the same state albeit the debridement is done with greater ease. There is less of the fibrinous eschar underneath the. There is no evidence of infection. She has follow-up with vascular surgery next week. No evidence of surrounding infection. Her original distal wounds healed while this one formed. 07/28/15; wound is easier to debride. No wound erythema. She is seeing Dr. [name] next week. 08/18/15 Has been to [place] for repeat ablation. Have been using medihoney pad with some improvement 08/25/15; absolutely no change in the condition of this wound in either its overall size or surface condition in many months now. At one point I had this down to [place] healthier surface I think with [facility] however this did not actually progress towards closure. Do not believe that the wound has actually deteriorated in terms of volume at all. We have been using a medihoney pad which allows easier debridement of the gelatinous eschar but again no overall actual improvement. the patient is going towards an ablation with pain and vascular which I think is scheduled for next week. The only other investigation that I could first see would be a biopsy. She  does have underlying scleroderma 09/02/15 eschar is much easier to debridement however the base of this does not look particularly vibrant. We changed to Iodoflex. The patient had her ablation earlier this week 09/09/15 again the debridement over the base of this wound is easier and the base of the wound looks considerably better. We will continue the Iodoflex. Dr. Tawni Millers has expressed his satisfaction with the result of her ablation 09/15/15 once again the wound is relatively free of surface eschar. No debridement was done today. It has a pale-looking base to it. although this is not as deep as it once was it seems to be expanding especially inferiorly. She has had recent venous ablation but this is no closer to healing.I've gone ahead and done a punch biopsy this from the inferior part of the wound close to normal skin 09/22/15: the wound is relatively free of surface eschar. There is some surrounding eschar. I'm not exactly sure at what level the surface is that I am seeing. 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 No evidence of atypical infection, malignancy. 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 No debridement was done 09/29/15; the wound surface is relatively free of surface eschar. There is eschar surrounding the walls of the wound. Aside from the improvement in the amount of surface slough. This wound has not progressed any towards closure. There is not even a surface that looks like there at this is ready. There is no evidence of any infection nor maligancy based on biopsy I did on 9/15. I continued with the Iodoflex however I am looking towards some alternative to try to promote some closure or filling in of this surface. Consider triple layer Oasis. Collagen did not result in adequate control of the surface slough 10/13/15; the patient was in hospital last week with severe anemia. The wound looks somewhat better after debridement  although there is widening medially. There is no evidence of infection. 10/20/15; patient's wound on the right lateral lower leg is essentially unchanged. This underwent a light surface debridement and in general the debridement is easier and the surface looks improved. I noted in doing this on the side of the wound what appears  to be a piece of calcium deposition. The patient noted that she had previous things on the tips of her fingers. 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 She follows with rheumatology and I have asked them to talk to her about this. In view of that S the nonhealing ulcer may have something to do with calcinosis and also unrelieved Raynaud's disease in this area. I should note that her original wounds on the right and left medial malleolus and the inner aspects of both legs just below the knees did however heal over 10/30/15; the patient's wound on the right lateral lower leg is essentially unchanged. I was able to remove some calcified material from the medial wound edge. 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 Otherwise the wound appears essentially unchanged there is less adherent surface eschar. Some of the calcified material was sent to pathology for analysis 11/17/15. The patient's wound on the right lateral leg is essentially unchanged. Wider Medially. 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 There is some surface slough but I did not debridement this. No evidence of surrounding infection. 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 This will make healing  difficult 12/15/15; the wound has less depth surface has a fibrinous slough and calcifications in the wound edge no evidence of infection 12/22/15; the wound definitely has less Fibrinous slough on the base. Calcifications around the wound edges are still evident. Although the wound bed looks healthier it is still pale in appearance. Previous biopsy did not show malignancy 01/04/15; surgical debridement of nonviable slough and subcutaneous tissue the wound cleans up quite nicely but appears to be expanding outward calcifications around the wound edges are still there. 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 She is to see her rheumatologist I'll see if he has any opinions on this. My punch biopsy done in September did not show calcifications although these are clearly evident. 01/19/16 light selective debridement of nonviable surface slough. There is epithelialization medially. This gives me reason for cautious optimism. She has been to see her rheumatologist, there is nothing that can be done for this type of soft tissue calcification associated with scleroderma 02/02/16 no debridement although there is a light surface slough. She has 2 peninsulas of skin 1 inferiorly and one medially. We continue to make a slight and slow but definite progressive here 02/16/16; light surface debridement with more attention to the circumference of the wound bed where the fibrinous eschar is more prevalent. No calcifications detected. She seems to have done nicely with the [facility] with some epithelialization and some improvement in the overall wound volume. 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 Continued attention to the circumference of the wound where the fibrinous eschar in calcinosis or prevalent. No calcifications were detected. She has  continued improvement with Hydrofera Blue. The wound is no longer as deep 03/15/16 surgical debridement done to remove surface escha Especially around the circumference of the wound where there is nonviable subcutaneous tissue. In spite of this there is  considerable improvement in the overall dimensions and depth of the wound. Islands of epithelialization are seen especially medially inferiorly and superiorly to a lesser extent. She is using Hydrofera Blue at home 03/29/16; surgical debridement done to remove surface eschar and nonviable subcutaneous tissue. This cleans up quite nicely mention slightly larger in terms of length and width however depth is less 04/12/16; continued gradual improvement in terms of depth and the condition of the wound base. Debridement is done. Continuing long standing Hydrofera Blue at home with Kerlix Coban wraps 04/26/16; continued gradual improvement in terms of depth and management as well as condition of the wound base. Surgical debridement done she continues with Hydrofera Blue. 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 She initially came to this clinic venous insufficiency ulcers which have long since healed 05/17/16 continued improvement in terms of the depth and measurements of this wound although she has a tightly adherent fibrinous slough each time. We've been continued with long standing Hydrofera Blue which seems to done as well for this wound is many advanced treatment options. 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 She also has chronic venous insufficiency. She has an irritation on her lateral right ankle secondary to our wraps 05/10/16; wound appears to be smaller especially on the medial aspect and especially in the width. Wound was debridement surface looks better. She is also been in the hospital apparently with anemia again she tells me she had an endoscopy. Since she got home  after 3 days which I believe was sometime last week she has had an irritated painful area on the right lateral ankle surrounding the lateral malleolus 05/31/16; much more adherent surface slough today then recently although I don't think the dimensions of changed that much. A more aggressive debridement is required. The irritated area over her medial malleolus is more pruritic and painful and I don't think represents cellulitis 06/14/16; no major change in her wound dimensions however there is more tightly adherent surface slough which is disappointing. As well as she appears to have a new small area medially. Furthermore an irritated uncomfortable area on the lateral aspect of her right foot just below the lateral malleolus. 06/21/16; I'm seeing the patient back in follow-up for the new areas under her major wound on the right anterior leg. She has been using Hydrofera Blue to this area probably for several months now and although the dressings seem to be helping for quite a period of time I think things have stagnated lately. She comes in today with a relatively tight adherent surface slough and really no changes in the wound shape or dimension. The 2 small areas she had inferiorly are tiny but still open they seem improved this well. There is no uncontrolled edema and I don't think there is any evidence of cellulitis. 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 Patient has had vascular evaluations both venous and arterial. I have biopsied this area. There is no obvious infection. The worrisome thing today is that she seems to be developing areas of erythema and epithelial damage on the medial aspect of the right foot. Also to a lesser degree inferior to the actual wound itself. Again I see no obvious changes to suggest cellulitis however as this is the only treatable option I will probably give her  antibiotics. 07/13/16 no major change  in the lady's large anterior right leg wound. Still covered with a very tightly adherent surface slough which is difficult to debridement. There is less erythema around this, culture last week grew pseudomonas I gave her ciprofloxacin. The area on her lateral right malleolus looks better- 08/02/16 the patient's wounds continued to decline. Her original large anterior right leg wound looks deeper. Still adherent surface slough that is difficult to debridement. She has a small area just below this and a punched-out wound just below her lateral malleolus. In the meantime she is been in hospital with apparently an upper GI bleed on Plavix and aspirin. She is now just on Plavix she received 3 units of packed cells 08/23/16; since I last saw this 3 weeks ago, the open large area on her right leg looks about the same syrup. She has a small satellite lesion just underneath this. The area on her medial right ankle is now a deep necrotic wound. I attempted to debridement this however there is just too much pain. 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 She follows with vascular surgery and is scheduled for an angiogram in early September 09/06/16; the patient is going for an arteriogram tomorrow. Her original large wound on the right calf is about the same the satellite lesion underneath it is about the same however the area on her medial ankle is now deeper with exposed tendon. I am no longer attempting to debride these wounds 09/20/16; the patient has undergone a right femoral endarterectomy and Dacron patch angioplasty. This seems to have helped the flow in her right leg. 10/04/16; Arrived today for aggressive debridement of the wounds on her right calf the original wound the one beneath it and a difficult area over her right lateral leg just above the lateral malleolus. 10/25/16; her 3 open wounds are  about the same in terms of dimensions however the surface appears a lot healthier post debridement. Using Iodoflex 10/18/16 we have been using Iodoflex to her wounds which she tolerated with some difficulty. 10/11/16; has been using Santyl for a period of time with some improvement although again very adherent surface slough would prevent any attempted healing this. She has a original wound on the left calf, the satellite underneath that and the most recent wound on the right medial ankle. She has completed revascularization by Dr. early and has had venous ablation earlier. Want to go back to Iodoflex to see if week and get a healthier surface to this wound bed failing this I think she'll need to be taken to the OR and I am prepared to call Dr. Marla Roe to discuss this. She is obviously not a good candidate for general anesthesia however.; 11/08/16; I put her on Iodoflex last time to see if I can get the wound bed any healthier and unfortunately today still had tremendous surface slough. 11/15/16; 4 weeks' worth of Iodoflex with not much improvement. Debridement on the major wounds on her left anterior leg is easier however this does not maintain from week to week. The punched-out area on her medial right ankle 11/29/16; I attempted to change the patient last visit to [facility] however she states this burned and was very uncomfortable therefore we gave her permission to go back to the eye out of complex which she already had at home. Also she noted a lot of pain and swelling on the lateral aspect of her leg before she traveled to  [place] for the holidays, I called her in doxycycline over the phone. This seems to have helped 12/06/16; Wounds unchanged by in large. Using Iodoflex 12/13/16; her wounds today actually looks somewhat better. The area on the right lateral lower leg has reasonably healthy-looking granulation and perhaps as actually filled and a bit. Debridement of the 2  wounds on the medial calf is easier and post debridement appears to have a healthier base. We have referred her to Dr. Migdalia Dk for consideration of operative debridement 12/20/16; we have a quick note from Dr. Merri Ray who feels that the patient needs to be referred to an academic center/plastic surgeon. This is due to the complexity of the patient's medical issues as it applies to anything in the OR. We have been using Iodoflex 12/27/16; in general the wounds on the right leg are better in terms of the difficult to remove surface slough. She has been using Iodoflex. She is approved for Apligraf which I anticipated ordering in the next week or 2 when we get a better-looking surface 01/04/16 the deep wounds on the right leg generally look better. Both of them are debrided further surface slough. The area on the lateral right leg was not debrided. She is approved for Apligraf I think I'll probably order this either next week or the week after depending on the surface of the wounds superiorly. We have been using Iodoflex which will continue until then 01/11/16; the deep wounds on the right leg again have a surface slough that requires debridement. I've not been able to get the wound bed on either one of these wounds down to what would be acceptable for an advanced skin stab-like Apligraf. The area on the medial leg has been improving. We have been using hot Iodoflex to all wounds which seems to do the best at at least limiting the nonviable surface 01/24/17; we have continued Iodoflex and all her wound areas. Her debridement Gen. he is easier and the surface underneath this looks viable. Nevertheless these are large area wounds with exposed muscle at least on the anterior parts. We have ordered Apligraf's for 2 weeks from now. The patient will be away next week 02/07/17; the patient was close to have first Apligraf today however we did not order one. I therefore replaced her Iodoflex. She essentially  has 3 large punched- out areas on her right anterior leg and right medial ankle. 02/11/17; Apligraf #1 02/25/17; Apligraf #2. In general some improvement in the right medial ankle and right anterior leg wounds. The larger wound medially perhaps some better 03/11/17; Apligraf #3. In general continued improvement in the right medial ankle and the right anterior leg wounds. 03/25/17 Apligraf #4. In general continued improvement especially on the right medial ankle and the lower 04/08/17; Apligraf #5 in general continued improvement in all wound sites. 04/22/17; post Apligraf #5 her wound beds continued to look a lot better all of them up to the surface of the surrounding skin. Had a caramel-colored slough that I did not debrided in case there is residual Apligraf effect. The wounds are as good as I have seen these looking quite some time. 04/29/17; we applied [place] and last week after completing her Apligraf. Wounds look as though they've contracted somewhat although they have a nonviable surface which was problematic in the past. Apparently she has been approved for further Apligraf's. We applied Iodosorb today after debridement. 05/06/17; we're fortunate enough today to be able to apply additional Apligraf approved by her insurance.  In general all of her wounds look better Apligraf #6 05/24/17; Apligraf #7 continued improvement in all wounds 3 06/10/17 Apligraf #8. Continued improvement in the surface of all wounds. Not much of an improvement in dimensions as I might a follow 06/24/17 Apligraf #9 continued general improvement although not as much change in the wound areas I might of like. She has a new open area on the right anterior lateral ankle very small and superficial. She also has a necrotic wound on the tip of her right index finger probably secondary to severe uncontrolled Raynaud's phenomenon. She is already on sildenafil and already seen her rheumatologist who gave her Keflex. 07/08/17; Apligraf  #10. Generalized improvement although she has a small additional wound just medial to the major wound area. 07/22/17; after discussion we decided not to reorder any further Apligraf's although there is been considerable improvement with these it hasn't been recently. The major wound anteriorly looks better. Smaller wounds beneath this and the more recent one and laterally look about the same. The area on the right lateral lower leg looks about the same 07/29/17 this is a patient who is exceedingly complex. She has advanced scleroderma, crest syndrome including calcinosis, PAD status post revascularization, chronic venous insufficiency status post ablations. She initially presented to this clinic with wounds on her bilateral lower legs however these closed. More recently we have been dealing with a large open area superiorly on the right anterior leg, a smaller wound underneath this and then another one on the right medial lower extremity. These improve significantly with 10 Apligraf applications. Over the last 2-3 weeks we are making good progress with Hydrofera Blue and these seem to be making progress towards closure 08/19/17; wounds continued to make good improvement with Hydrofera Blue and episodic aggressive debridements 08/26/17; still using Hydrofera Blue. Good improvements 09/09/17; using Hydrofera Blue continued improvement. Area on the lateral part of her right leg has only a small remaining open area. The small inferior satellite region is for all intents and purposes closed. Her major wound also is come in in terms of depth and has advancing epithelialization. 09/16/17; using Hydrofera Blue with continued improvement. The smaller satellite wound. We've closed out today along with a new were satellite wound medially. The area on her medial ankle is still open and her major wound is still open but making improvement. All using Hydrofera Blue. Currently 09/30/17; using Hydrofera Blue. Still a small  open area on the lateral right ankle area and her original major wound seems to be making gradual and steady improvement. 10/14/17; still using Hydrofera Blue. Still too small open areas on the right lateral ankle. Her original major wound is horizontal and linear. The most problematic area paradoxically seems to be the area to the medial area wears I thought it would be the lateral. 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 10/28/17; still using Hydrofera Blue. Right lateral ankle has a very small open area superiorly on the most lateral part of the wound. Her original open wound has 2 open areas now separated by normal skin and we've redefined this. 11/11/17; still using Hydrofera Blue area and right lateral ankle continues to have a small open area on mostly the lateral part of the wound. Her original wound has 2 small open areas now separated by a considerable amount of normal skin 11/28/17; the patient called in slightly before Thanksgiving to report pain and erythema above the wound on the right leg.  In the past this is responded well to treatment for cellulitis and I gave her over the phone doxycycline. She stated this resulted in fairly abrupt improvement. We have been using Hydrofera Blue for a prolonged period of time to the larger wound anteriorly into the remaining wound on her right right lateral ankle. The latter is just about closed with only a small linear area and the bottom of the [place]. 12/02/17; use endoform and left the dressing on since last visit. There is no tenderness and no evidence of infection. 12/16/17; patient has been using Endoform but not making much progress. The 2 punched out open areas anteriorly which were the reminiscence of her major wound appear deeper. The area on the lateral aspect of her right calf also appears deeper. Also she has a puzzling tender swelling above her wound on the right leg. This seems larger  than last time. Just above her wounds there appears to be some fluctuance in this area it is not erythematous and there is no crepitus 12/30/17; patient has been using Endoform up until last week we used Hydrofera Blue. Ultrasound of the swelling above her 2 major wounds last time was negative for a fluid collection. I gave her cefaclor for the erythema and tenderness in this area which seems better. Unfortunately both punched out areas anteriorly and the area on her right medial lower leg appear deeper. In fact the lateral of the wounds anteriorly actually looks as though it has exposed tendon and/or muscle sheath. She is not systemically unwell. She is complaining of vaginitis type symptoms presumably Candida from her antibiotics. 01/06/18; we're using santyl. she has 2 punched out areas anteriorly which were initially part of a large wound. Unfortunately medially this is now open to tendon/muscle. All the wounds have the same adherent very difficult to debride surface. 01/20/18; 2 week follow-up using Santyl. She has the 2 punched out areas anteriorly which were initially part of her large surface wound there. Medially this still has exposed muscle. All of these have the same tightly adherent necrotic surface which requires debridement. PuraPly was not accepted by the patient's insurance however her insurance I think it changed therefore we are going to run Apligraf to gain 02/03/18; the patient has been using Santyl. The wound on the right lateral ankle looks improved and the 2 areas anteriorly on the right leg looks about the same. The medial one has exposed muscle. The lateral 1 requiresdebridement. We use PuraPly today for the first week 02/10/18; PuraPly #2. The patient has 3 wounds. The area on the right lateral ankle, 2 areas anteriorly that were part of her original large wound in this area the medial one has exposed muscle. All of the wounds were lightly debrided with a number 3 curet. PuraPly #2  applied the lateral wound on the calf and the right lateral ankle look better. 02/17/18; PuraPly #3. Patient has 3 wounds. The area on the right lateral ankle in 2 areas internally that were part of her original large wound. The lateral area has exposed muscle. She arrived with some complaints of pain around the right ankle. 02/24/18; PuraPly #4; not much change in any of the 3 wound areas. Right lateral ankle, right lateral calf. Both of these required debridement with a #3 curet. She tolerates this marginally. The area on the medial leg still has exposed muscle. Not much change in dimensions 03/03/18 PuraPly #5. The area on the medial ankle actually looks better however the 2 separate areas that  were original parts of the larger right anterior leg wound look as though they're attempting to coalesce. 03/10/18; PuraPly #6. The area on the medial ankle actually continues to look and measures smaller however the 2 separate areas that were part of the original large wound on the right anterior leg have now coalesced. There hasn't been much improvement here. The lateral area actually has underlying exposed muscle 03/17/18-she is here in follow-up evaluation for ulcerations to the right lower extremity. She is voicing no complaints or concerns. She tolerated debridement. Puraply#7 placed 03/24/18; difficult right lower extremity ulcerations. PuraPly #8 place. She is been approved for [employer]. She did very well with Apligraf today however she is apparently reached her "lifetime max" 03/31/18; marginal improvement with PuraPly although her wounds looked as good as they have in several weeks today. Used TheraSkins #1 04/14/18 TheraSkin #2 today 04/28/18 TheraSkins #3. Wound slightly improved 05/12/18; TheraSkin skin #4. Wound response has been variable. 05/27/18 TheraSkin #5. Generally improvement in all wound areas. I've also put her in 3 layer compression to help with the severe venous hypertension 06/09/18;  patient is done quite well with the TheraSkins unfortunately we have no further applications. I also put her in 3 layer compression last week and that really seems to of helped. 06/16/18; we have been using silver collagen. Wounds are smaller. Still be open area to the muscle layer of her calf however even that is contracted somewhat. She tells me that at night sometimes she has pain on the right lateral calf at the site of her lower wound. Notable that I put her into 3 layer compression about 3 weeks ago. She states that she dangles her leg over the bed that makes it feel better but she does not describe claudication during the day She is going to call her secondary insurance to see if they will continue to cover advanced treatment products I have reviewed her arterial studies from 01/22/17; this showed an ABI in the right of 1 and on the left noncompressible. TBI on the right at 0.30 on the left at 0.34. It is therefore possible she has significant PAD with medial calcification falsely elevating her ABI into the normal range. I'll need to be careful about asking her about this next week it's possible the 3 layer compression is too much 06/23/18; was able to reapply TheraSkin 1 today. Edema control is good and she is not complaining of pain no claudication 07/07/18;no major change. New wound which was apparently a taper removal injury today in our clinic between her 2 wounds on the right calf TheraSkins #2 07/14/18; I think there is some improvement in the right lateral ankle and the medial part of her wound. There is still exposed muscle medially. 07/28/18; two-week follow-up. TheraSkins#3. Unfortunately no major change. She is not a candidate I don't think for skin grafting due to severe venous hypertension associated with her scleroderma and pulmonary hypertension 08/11/18 Patient is here today for her [employer] application #20 (#5 of the second set). She seems to be doing well and in the base of the  wound appears to show some progress at this point. This is the last approved Theraskin of the second set. 08/25/18; she has completed TheraSkin. There has been some improvement on the right lateral calf wound as well as the anterior leg wounds. The open area to muscle medially on the anterior leg wound is smaller. I'm going to transition her back to [facility] under Kerlix Coban change every second day.  She reports that she had some calcification removed from her right upper arm. We have had previous problems with calcifications in her wounds on her legs but that has not happened recently 09/08/18;using Hydrofera Blue on both her wound areas. Wounds seem to of contracted nicely. She uses Kerlix Coban wrap and changes at home herself 09/22/2018; using Hydrofera Blue on both her wound areas. Dimensions seem to have come down somewhat. There is certainly less depth in the medial part of the mid tibia wound and I do not think there is any exposed muscle at this point. 10/06/2018; 2-week follow-up. Using [facility] on her wound areas. Dimensions have come down nicely both on the right lateral ankle area in the right mid tibial area. She has no new complaints 10/20/2018; 2-week follow-up. She is using Hydrofera Blue. Not much change from the last time she was here. The area on the lateral ankle has less depth although it has raised edges on one side. I attempted to remove as much of the raised edge as I could without creating more additional wounds. The area on the right anterior mid tibia area looks the same. 11/03/2018; 2-week follow-up she is using Hydrofera Blue. On the right anterior leg she now has 2 wounds separated by a large area of normal skin. The area on the medial part still has I think exposed muscle although this area itself is a lot smaller. The area laterally has some depth. Both areas with necrotic debris. The area on her right lateral ankle has come in nicely 11/17/2018; patient  continues to use Hydrofera Blue. We have been increasing separation of the 2 wounds anteriorly which were at one point joint the area on the right lateral calf continues to have I think some improvement in depth. 12/08/2018; patient continues to use Hydrofera Blue. There is some improvement in the area on the right lateral calf. The 2 areas that were initially part of the original large wound in the mid right tibia are probably about the same. In fact the medial area is probably somewhat larger. We will run puraply through the patient's insurance 12/22/2018; she has been using Hydrofera Blue. We have small wounds on the right lateral calf and 2 small areas that were initially part of a large wound in the right mid tibia. We applied pure apply #1 today. 12/29/2018; we applied puraply #2. Her wounds look somewhat better especially on the right lateral calf and the lateral part of her original wound in the mid tibial area. 01/05/2019; perhaps slightly improved in terms of wound bed condition but certainly not as much improvement as I might of liked. Puraply #3 1/13: we did not have a correct sized puraply to apply. wounds more pinched out looking, I increased her compression to 3 layer last week to help with significant multilevel venous hypertension. Since then I've reviewed her arterial status. She has a right femoral endarterectomy and a distal left SFA stent. She was being followed by Dr. [name] for a period however she does not appear to have seen him in 3 years. I will set up an appointment. 1/20. The patient has an additional wound on the right lateral calf between the distal wound and proximal wounds. We did not have Puraply last week. Still does not have a follow-up with Dr. early 1/27: Follow-up with Dr. early has been arranged apparently with follow-up noninvasive studies. Wounds are measuring roughly the same although they certainly look smaller 2/3; the patient had non invasive studies. Her  ABIs on the right were 0.83 and on the left 1.02 however there was no great toe pressure bilaterally. Also worrisome monophasic waveforms at the PTA and dorsalis pedis. We are still using Puraply. We have had some improvement in all of the wounds especially the lateral part of the mid tibial area. 2/10; sees Dr. early of vein and vascular re-arterial studies next week. Puraply reapplied today. 2/17; Dr. early of vein and vascular his appointment is tomorrow. Puraply reapplied after debridement of all wounds 2/24; the patient saw Dr. early I reviewed his note. sHe noted the previous right femoral endarterectomy with a Dacron patch. He also noted the normal ABI and the monophasic waveforms suggesting tibial disease. Overall he did not feel that she had any evidence of arterial insufficiency that would impair her wound healing. He did note her venous disease as well. He suggested PRN follow-up. 3/2; I had the last puraply applied today. The original wounds over the mid tibia area are improved where is the area on the right lower leg is not 3/9; wounds are smaller especially in the right mid tibia perhaps slightly in the right lateral calf. We finished with puraply and went to endoform today 3/23; the patient arrives after 2 weeks. She has been using endoform. I think all of her wounds look slightly better which includes the area on the right lateral calf just above the right lateral malleolus and the 2 in the right mid tibia which were initially part of the same wound. 4/27; TELEHEALTH visit; the patient was seen for telehealth visit today with her consent in the middle of the worldwide epidemic. Since she was last here she called in for antibiotics with pain and tenderness around the area on the right medial ankle. I gave her empiric doxycycline. She states this feels better. She is using endoform on both of these areas 5/11 TELEHEALTH; the patient was seen for telehealth visit today. She was  accompanied at home by her husband. She has severe pulmonary hypertension accompanied scleroderma and in the face of the Covid epidemic cannot be safely brought into our clinic. We have been using endoform on her wound areas. There are essentially 3 wound areas now in the left mid tibia now 2 open areas that it one point were connected and one on the right lateral ankle just above the malleolus. 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 She cannot come to clinic. The wounds were reviewed today via telehealth. She has severe chronic venous hypertension which I think is centrally mediated. She has wounds on her right anterior tibia and right lateral ankle area. These are chronic. She has been using endoform. 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 We have been following her from telehealth. She has severe chronic venous hypertension which may be mostly centrally mediated secondary to right heart heart failure. She has wounds on her right anterior tibia and right lateral ankle these are chronic we have been using endoform 6/22; TELEHEALTH; this patient was seen today via telehealth. 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 Since the last time we had contact with this patient she developed  some pain and erythema around the wound on her right lateral malleolus/ankle and we put in antibiotics for her. This is resulted in good improvement with resolution of the erythema and tenderness. I changed her to silver alginate last time. We had been using endoform for an extended period of time 7/13; TELEHEALTH; this  patient was seen today via telehealth. 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 She would continue to be at a prohibitive risk to be brought into our clinic unless this was absolutely necessary. These visits have been done with her approval as well as her husband. We have been using silver alginate to the areas on the right mid tibia and right lateral lower leg. 7/27 TELEHEALTH; patient was seen along with her husband today via telehealth. 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 We changed her to sample last visit. She has 2 areas a chronic wound on her right mid tibia and one just above her ankle. These were not the original wounds when she came into this clinic but she developed them during treatment 8/17; she comes in for her first face-to-face visit in a long period. She has a remaining area just medial to the right tibia which is the last open part of her large wound across this area. She also has an area on the right lateral lower leg. We prescribed Santyl last telehealth visit but they were concerned that this was making a deeper so they put silver alginate on it last week. Her husband changes the dressings. 8/31; using Santyl to the 2 wound areas some improvement in wound surfaces. Husband has surgery in 2 weeks we will put her out 3 weeks. Any of the advanced treatment options that I can think of that would be eligible for this wound would also cause her to have to come in weekly. The risk that the patient is just too high 9/21. Using Santyl to the 2 wound areas. Both of these are somewhat better although the medial mid tibia area still has exposed muscle. Lateral ankle requiring debridement. Using Santyl 10/12; using Santyl to the 2 wound areas. One on the right lateral ankle and the other in the medial calf which still has exposed muscle. Both areas have  come down in size and have better looking surfaces. She has made nice progress with santyl 11/2; we have been using Santyl to the 2 wound areas. Right lateral ankle and the other in the mid tibia area the medial part of this still has exposed muscle. 11/23/2019 on evaluation today patient appears to be doing about the same really with regard to her wounds. She is actually not very pleased with how things seem to be progressing at this point she tells me that she really has not noted much improvement unfortunately. With that being said there is no signs of active infection at this time. There is some slough buildup noted at this time which again along with some dry skin around the edges of the wound I think would benefit her to try to debride some of this away. Fortunately her pain is doing fairly well. She still has exposed muscle in the right medial/tibial area. 12/14; TELEHEALTH; she was changed to [facility] to the right calf wound and right lateral ankle wound when she was here last time. Unfortunately since then she had a fall with a pelvic fracture and a fracture of  her wrist. She was apparently hospitalized for 5 days. I have not looked at her discharge summary. She apparently came out of the hospital with a blister on her right heel. She was seen today via telehealth by myself and our case manager. The patient and her husband were present. She has been using Hydrofera Blue at our direction from the last time she was in the clinic. There is been no major improvement in fact the areas appear deeper and with a less viable surface 01/04/2020; TELEHEALTH; the patient was seen today in accompaniment of her husband and our nurse. She has 3 open areas 1 on the right medial mid tibia, one on the right lateral ankle and a large eschared pressure area on the right heel. We have been using Iodoflex to the 2 original wounds. 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 It is  simply too perilous for her to be seen in any other way 1/26; TELEHEALTH; the patient was seen today in accompaniment of her husband and our RN one of our nurses. She still has the 3 open areas 1 on the right medial mid tibia which is the remanent of a more extensive wound in this area, one on the right lateral ankle and a large eschared pressure area on the right heel. We have been using Iodoflex to the 2 original wounds and a bed at 9 application to the eschared area on the heel 2/15; the first time we have seen this patient and then several months out of concern for the pandemic. She had a large horizontal wound in the mid tibia. Only the medial aspect of this is still open. Area on the lateral ankle is just about closed. She had a new pressure ulcer on the right heel which I have removed some of the eschar. We have been using Iodoflex which I will continue. The area in the mid tibia has a round circle in the middle of exposed muscle. I think we would have to use an advanced treatment product to stimulate granulation over this area. We will run this through her insurance. She is not eligible for plastic surgery for many reasons 3/1; TELEHEALTH. The patient was seen today by telehealth. 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 We have been using Iodoflex to the wound areas which include the right anterior mid tibia, right lateral ankle and the right heel. All of these were reviewed 3/8; the patient's wound just above the right ankle is closed. She still has a contracting black eschar on the heel where she had a pressure ulcer. The medial part of her original wound on the mid tibia has exposed muscle. We have made really made no progress in this area although we have managed to get a lot of the original wound in this area to close. We used Apligraf #1 today 3/22; the patient's ankle wound remains closed.  She still has a contracting black eschar on the heel although it seems to have less surface area. It still not clear whether there is any depth here. We used Apligraf #2 today in an attempt to get granulation over the exposed muscle and what is left of her mid tibia area. 4/5; everything is closed except the medial aspect of the mid tibia wound, as well as the pressure injury on the right heel. She has been using Betadine to the right heel which has been gradually contracting.  We used Apligraf #3 today. 4/19; Apligraf #4. Still a pressure area on the right heel she has been using Betadine superficial excoriated areas she has been applying salicylic acid to on the anterior leg and the thick horn on the leg just above her wound area 5/3; Apligraf #5. Unfortunately she came in today with a reopening of the area on the right lateral lower leg. Not much change in the wound we have been treating in the mid calf. Quite a bit of edema surrounding this wound. She reports that she was up on her feet quite a bit. The area on the right heel is separating she has been using Betadine She comes in with a new wound on the left lateral malleolus which is very disappointing this is been open since last week 5/17; we applied her last Apligraf last time. Unfortunately that did not have any effect on the deep area medially in the mid tibial area. However the area over the right lateral malleolus is a lot better. Right heel also is contracted we used Iodoflex last time. The new wound on the left lateral malleolus were using Santyl to. This required debridement. 6/1. Not much change in the area on the right medial mid tibia. Right lateral ankle is improved she has the necrotic area on the right heel which was a pressure area. New area on the left lateral malleolus from last time. I changed her to Iodoflex to the area on the left lateral malleolus she said this hurt I have put her back on Santyl 6/15; right mid tibia is  unchanged. I do not think there is anything I can do to this topically that we will get this to granulate over the muscle. And I am furthermore I am not sure that she is a surgical candidate either because of her severe pulmonary issues or because she really does not want to go through it. The area on the right lateral malleolus is a small punched out area. The area on the left lateral malleolus again small punched out with nonviable surface Pressure ulcer on the right heel at separating black eschar. I went ahead and remove this today she has an area on the left anterior mid tibia just laterally. Geographic wounds debris on the surface. Some erythema here. 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 There is skin changes widely Change the primary dressing to Sorbact to all wound areas. She needs compression on the left leg as well as the right 6/21; right mid tibia which was her one remaining wound at 1 point is unchanged The area on the right lateral malleolus actually is close to closing over still a small punched out area The area on the left lateral malleolus again a deep wound it is this 1 that she feels pain in. Pressure ulcer on the right heel was cultured today. New area on the left anterior mid tibia several geographic wounds last week in the setting of chronic venous insufficiency this actually looks some better 7/6; Right mid tibia about the same. Exposed muscle Left lateral malleolus again necrotic debris over the surface. Pressure ulcer on the right heel. She finished the Keflex we prescribed. Necrotic debris debrided with a #3 curette The rest of her wounds on the right mid tibia right lateral ankle are better Her husband reminds me that she had a right femoral endarterectomy and has 2 stents in her left thigh placed in 2011 approximately by vascular surgery in [place]. She saw Dr. [name]  last in February 2020. At that point he did not think that the arterial  insufficiency she had on the right was sufficient to explain any of her right lower extremity wounds however she now has an area on the left lateral malleolus that looks like an ischemic wound 7/12; we have been using Sorbact all wound areas Right mid tibia about the same exposed muscle Left lateral malleolus small wound with debris in the surface punched out Pressure ulcer of the right heel requiring debridement of necrotic debris Lateral ankle wound on the right is just about closed Right mid tibia wound about the same still with exposed muscle. 7/26 really no change in any wounds. We have been using Sorbact. She has bilateral lower extremity wounds. This is in the setting of scleroderma, severe venous hypertension. She also has known PAD and I had really hoped to be able to get a review by Dr. [name] but apparently that is not booked until August 31. I have asked her to call back to vein and vascular and get an appointment with somebody a little earlier if that is possible. 8/16; patient's area on the right ankle which was a chronic wound is closed over again. The area on the right mid lower leg, right heel left lateral ankle are all about the same. Pressure ulcer on the right heel is still not viable. New areas identified on the right lateral calf. She has a follow-up with Dr. [name] on 8/31. Phenomenally I would like him to go over her arterial status with regards to her underlying stents. 9/13; patient had her ARTERIAL studies done however apparently Dr. [name] is now in [place] so she did not get an appointment with him on 8/31 and was not referred to any other provider. On the right side her PTA was monophasic. Noncompressible. TBI on the right at 0.41 on the left she was monophasic and noncompressible they did not do a TBI because she had a Band-Aid on her big toe. Patient does not describe claudication although she is having a lot of pain in the left lateral malleolus. 10/11; the patient  had a right-sided interventions on 10/5. She had a angioplasty of the right anterior tibial artery as well as the tibioperoneal trunk/peroneal artery she had a drug-coated balloon angioplasty of the right superficial femoral artery. On the left she did not have any interventions yet but is going to have another look at this in 2 weeks. The superficial femoral artery on the left and the associated stent are patent popliteal artery is patent to the dominant vessel runoff is the anterior tibial which has several high-grade stenosis in the proximal one third. We have been using Sorbact. She also has a new area on the right upper mid tibia. 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 She tolerated this well. She is using Iodoflex and her husband is doing a kerlix Coban on her. She arrives in clinic today with the original medial part of her mid tibia wound with muscle exposure small area on the right ankle which was part of her original wound she has 2 punched-out areas laterally and 1 above her area anteriorly. Nonviable surfaces similarly the right heel is nonviable. On the left side she has an area on the left  foot and the left lateral ankle similar nonviable surfaces. 11/8; patient arrives in clinic today with nothing improved. She has a multiple lightly group of wounds on the right lower leg presumably different etiologies including a skin biopsy, the original wound she had medially and some punched-out areas that are not of clear defined etiology. She says she was in a lot of pain this week could not have her leg up on the bed did not get completely relief from dropping it over the bed. She also has a small area on the left lateral malleolus and the left  lateral foot necrotic surfaces on all of these wounds. We have been using Iodoflex I attempted to put her in 3 layer compression last week that did not go well. We will be backing off on kerlix Coban this week. 11/15; the ultrasound that I sent the patient for last week showed findings consistent with acute deep vein thrombosis involving the right distal femoral vein and right popliteal vein. I was concerned about the combination of Plavix and Xarelto however apparently Dr. [name] felt that was not a problem. She is now going through the acute dosing of Xarelto. She has an appointment with Dr. Trula Slade on 12/5. 11/29; 10 days ago before he left for weeks vacation she called to report pain in the right leg I sent her in some doxycycline her husband thought things were getting better however she required admission to hospital from 11/22 through 11/24 prompted by finding that her hemoglobin was 5.7 she was transfused 2 units of packed cells and her hemoglobin stabilized. She did undergo an endoscopy with findings of significant esophagitis part of which was candidal and part of it reflux. She was discharged on nystatin swish and swallow which she is doing. She seems to be better from that regard. There was no active other active source of bleeding Unfortunately the combination of Xarelto and Plavix was felt to be too much for her. The Xarelto was stopped and an IVC filter was placed she is continued on Plavix. Her big complaint today is pain in the right calf really from the mid aspect superiorly. This is very tender slightly red but not particularly warm. She did see vein and vascular in the hospital I will see if I can pull their note 12/6; patient was at Dr. Stephens Shire office today. Great toe pressure on the right is 54 on the left 49 she has a 50 to 74% stenosis of the right mid popliteal on the left at 30 to 49% proximal SFA and a 50 to 74% CFA. She was treated with angioplasty of the right ATA,  right TP trunk and peroneal artery as well as an angioplasty of the right SFA to popliteal artery. On 10/26 she underwent angioplasty of the left ATA and left TP trunk she has a history of a left common femoral endarterectomy and left superficial femoral artery stent as well as a right superficial femoral artery stent. In addition to her arterial disease she has significant venous disease with a history of a right greater saphenous vein ablation and a recent DVT in the right femoral and popliteal vein with insertion of an IVC filter. She has remained on Plavix but her anticoagulant was stopped. She continues to have a lot of swelling and pain in the right leg with significant erythema which I am assuming is stasis dermatitis. She has had substantial wound areas on the right leg including right lateral a large necrotic wound with a smaller biopsy  site above this. She has original wound on the left mid tibia with a small area above this as well. On the left she has a small area over the left lateral malleolus and the left lateral foot 12/22-week follow-up. Her right leg looks really terrible. She has another necrotic wound just above her original one on the right posterior leg. Greenish drainage suggestive of gram-negative/Pseudomonas infection. The entire mid part of her leg is erythematous I think most of this is stasis dermatitis rather than cellulitis. She has also a bothersome ischemic-looking right fourth toe which I noticed today without her really complaining. She has not been systemically unwell although she looks more physically frail 12/27; I reached out to vein and vascular last weekend Dr. [name] will be seeing her on Thursday. She has a gangrenous area on the tip of the right fourth toe. Multiple areas breaking down. I think this is a combination of arterial and severe venous insufficiency she has multiple wounds in the right leg but not a lot of pain. She has 3 small areas on the left leg  we have been using Santyl. On the right leg we have been using silver alginate or at least that is what they have been using Culture I did last week showed abundant is Pseudomonas aeruginosa had a few Enterococcus faecalis. I have been using Ceftin here predominantly because of the green drainage suggesting Pseudomonas I have not yet addressed the Enterococcus which was ampicillin sensitive Electronic Signature(s) Signed: 12/27/2020 2:27:07 PM By: Linton Ham MD Entered By: Linton Ham on 12/26/2020 16:38:33 -------------------------------------------------------------------------------- Physical Exam Details Patient Name: Date of Service: [name], [name] 12/26/2020 2:00 PM Medical Record Number: [id number] Patient Account Number: [id number] Date of Birth/Sex: Treating RN: 11-01-1951 (69 y.o. [name] Primary Care Provider: [name] Other Clinician: Referring Provider: Treating Provider/Extender: [name] in Treatment: 29 Constitutional Patient is hypertensive.. Pulse regular and within target range for patient.Marland Kitchen Respirations regular, non-labored and within target range.. Temperature is normal and within the target range for the patient.Marland Kitchen Appears in no distress. Cardiovascular Pulses are nonpalpable on the right. Notes Wound exam Sizable necrotic wounds on the right posterior lateral leg. Punched out necrotic wounds on the right anterior right lateral multitude of superficial areas which are in the setting I believe of chronic venous insufficiency. Also has the original wound in right mid tibia. Dry gangrene on the tip of the right fourth toe 3 small areas on the left again necrotic surfaces but not nearly as significant as on the right Electronic Signature(s) Signed: 12/27/2020 2:27:07 PM By: Linton Ham MD Entered By: Linton Ham on 12/26/2020  16:40:02 -------------------------------------------------------------------------------- Physician Orders Details Patient Name: Date of Service: [name], [name] 12/26/2020 2:00 PM Medical Record Number: [id number] Patient Account Number: [id number] Date of Birth/Sex: Treating RN: 1951/10/17 (69 y.o. 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 Bathing/ Shower/ Hygiene May shower and wash wound with soap and water. - on days that dressing is changed Edema Control - Lymphedema / SCD / Other Bilateral Lower Extremities Elevate legs to the level of the heart or above for 30 minutes daily and/or when sitting, a frequency of: - throughout the day Avoid standing for long periods of time. Exercise regularly Wound Treatment Wound #16 - Calcaneus Wound Laterality: Right Cleanser: Soap and Water 2 x Per Week/30 Days Discharge Instructions: May shower and wash  wound with dial antibacterial soap and water prior to dressing change. Peri-Wound Care: Sween Lotion (Moisturizing lotion) 2 x Per Week/30 Days Discharge Instructions: Apply moisturizing lotion as directed Prim Dressing: Iodosorb Gel 10 (gm) Tube (DME) (Dispense As Written) 2 x Per Week/30 Days ary Discharge Instructions: Apply to wound bed as instructed Secondary Dressing: Woven Gauze Sponge, Non-Sterile 4x4 in (DME) (Generic) 2 x Per Week/30 Days Discharge Instructions: Apply over primary dressing as directed. Secondary Dressing: ABD Pad, 8x10 (DME) 2 x Per Week/30 Days Discharge Instructions: Apply over primary dressing as directed. Compression Wrap: Kerlix Roll 4.5x3.1 (in/yd) (DME) (Generic) 2 x Per Week/30 Days Discharge Instructions: Apply Kerlix and Coban compression as directed. Compression Wrap: Coban Self-Adherent Wrap 4x5 (in/yd) (DME) (Generic) 2 x Per Week/30 Days Discharge Instructions: Apply over Kerlix as directed. Wound #19 - Malleolus Wound Laterality: Right, Lateral Cleanser: Soap and Water 2 x Per Week/7 Days Discharge Instructions: May shower and wash wound with dial antibacterial soap and water prior to dressing change. Peri-Wound Care: Sween Lotion (Moisturizing lotion) 2 x Per Week/7 Days Discharge Instructions: Apply moisturizing lotion as directed Prim Dressing: Iodosorb Gel 10 (gm) Tube (DME) (Dispense As Written) 2 x Per Week/7 Days ary Discharge Instructions: Apply to wound bed as instructed Secondary Dressing: Woven Gauze Sponge, Non-Sterile 4x4 in (DME) (Generic) 2 x Per Week/7 Days Discharge Instructions: Apply over primary dressing as directed. Secondary Dressing: ABD Pad, 8x10 (DME) 2 x Per Week/7 Days Discharge Instructions: Apply over primary dressing as directed. Compression Wrap: Kerlix Roll 4.5x3.1 (in/yd) (DME) (Generic) 2 x Per Week/7 Days Discharge Instructions: Apply Kerlix and Coban compression as directed. Compression Wrap: Coban Self-Adherent  Wrap 4x5 (in/yd) (DME) (Generic) 2 x Per Week/7 Days Discharge Instructions: Apply over Kerlix as directed. Wound #20 - Malleolus Wound Laterality: Left, Lateral Cleanser: Soap and Water Every Other Day/15 Days Discharge Instructions: May shower and wash wound with dial antibacterial soap and water prior to dressing change. Peri-Wound Care: Sween Lotion (Moisturizing lotion) Every Other Day/15 Days Discharge Instructions: Apply moisturizing lotion as directed Prim Dressing: Santyl Ointment Every Other Day/15 Days ary Discharge Instructions: Apply to wound bed as instructed Secondary Dressing: Woven Gauze Sponge, Non-Sterile 4x4 in Every Other Day/15 Days Discharge Instructions: Apply over primary dressing as directed. Compression Wrap: Kerlix Roll 4.5x3.1 (in/yd) (Generic) Every Other Day/15 Days Discharge Instructions: Apply Kerlix and Coban compression as directed. Compression Wrap: Coban Self-Adherent Wrap 4x5 (in/yd) (Generic) Every Other Day/15 Days Discharge Instructions: Apply over Kerlix as directed. Wound #23 - Lower Leg Wound Laterality: Right, Posterior Cleanser: Soap and Water 2 x Per Week/7 Days Discharge Instructions: May shower and wash wound with dial antibacterial soap and water  prior to dressing change. Peri-Wound Care: Sween Lotion (Moisturizing lotion) 2 x Per Week/7 Days Discharge Instructions: Apply moisturizing lotion as directed Prim Dressing: Iodosorb Gel 10 (gm) Tube 2 x Per Week/7 Days ary Discharge Instructions: Apply to wound bed as instructed Secondary Dressing: Woven Gauze Sponge, Non-Sterile 4x4 in 2 x Per Week/7 Days Discharge Instructions: Apply over primary dressing as directed. Secondary Dressing: ABD Pad, 8x10 2 x Per Week/7 Days Discharge Instructions: Apply over primary dressing as directed. Compression Wrap: Kerlix Roll 4.5x3.1 (in/yd) 2 x Per Week/7 Days Discharge Instructions: Apply Kerlix and Coban compression as directed. Compression Wrap:  Coban Self-Adherent Wrap 4x5 (in/yd) 2 x Per Week/7 Days Discharge Instructions: Apply over Kerlix as directed. Wound #24 - Lower Leg Wound Laterality: Right, Anterior Cleanser: Soap and Water 2 x Per Week/7 Days Discharge Instructions: May shower and wash wound with dial antibacterial soap and water prior to dressing change. Peri-Wound Care: Sween Lotion (Moisturizing lotion) 2 x Per Week/7 Days Discharge Instructions: Apply moisturizing lotion as directed Prim Dressing: Iodosorb Gel 10 (gm) Tube 2 x Per Week/7 Days ary Discharge Instructions: Apply to wound bed as instructed Secondary Dressing: Woven Gauze Sponge, Non-Sterile 4x4 in 2 x Per Week/7 Days Discharge Instructions: Apply over primary dressing as directed. Secondary Dressing: ABD Pad, 8x10 2 x Per Week/7 Days Discharge Instructions: Apply over primary dressing as directed. Compression Wrap: Kerlix Roll 4.5x3.1 (in/yd) 2 x Per Week/7 Days Discharge Instructions: Apply Kerlix and Coban compression as directed. Compression Wrap: Coban Self-Adherent Wrap 4x5 (in/yd) 2 x Per Week/7 Days Discharge Instructions: Apply over Kerlix as directed. Wound #25 - Foot Wound Laterality: Left, Lateral Cleanser: Soap and Water Every Other Day/15 Days Discharge Instructions: May shower and wash wound with dial antibacterial soap and water prior to dressing change. Peri-Wound Care: Sween Lotion (Moisturizing lotion) Every Other Day/15 Days Discharge Instructions: Apply moisturizing lotion as directed Prim Dressing: Santyl Ointment Every Other Day/15 Days ary Discharge Instructions: Apply to wound bed as instructed Secondary Dressing: Woven Gauze Sponge, Non-Sterile 4x4 in Every Other Day/15 Days Discharge Instructions: Apply over primary dressing as directed. Compression Wrap: Kerlix Roll 4.5x3.1 (in/yd) Every Other Day/15 Days Discharge Instructions: Apply Kerlix and Coban compression as directed. Compression Wrap: Coban Self-Adherent Wrap 4x5  (in/yd) Every Other Day/15 Days Discharge Instructions: Apply over Kerlix as directed. Wound #26 - Lower Leg Wound Laterality: Right, Lateral Cleanser: Soap and Water 2 x Per Week/30 Days Discharge Instructions: May shower and wash wound with dial antibacterial soap and water prior to dressing change. Peri-Wound Care: Sween Lotion (Moisturizing lotion) 2 x Per Week/30 Days Discharge Instructions: Apply moisturizing lotion as directed Prim Dressing: Iodosorb Gel 10 (gm) Tube 2 x Per Week/30 Days ary Discharge Instructions: Apply to wound bed as instructed Secondary Dressing: Woven Gauze Sponge, Non-Sterile 4x4 in 2 x Per Week/30 Days Discharge Instructions: Apply over primary dressing as directed. Secondary Dressing: ABD Pad, 8x10 2 x Per Week/30 Days Discharge Instructions: Apply over primary dressing as directed. Compression Wrap: Kerlix Roll 4.5x3.1 (in/yd) 2 x Per Week/30 Days Discharge Instructions: Apply Kerlix and Coban compression as directed. Compression Wrap: Coban Self-Adherent Wrap 4x5 (in/yd) 2 x Per Week/30 Days Discharge Instructions: Apply over Kerlix as directed. Wound #27 - Lower Leg Wound Laterality: Right, Anterior, Distal Cleanser: Soap and Water 2 x Per Week/7 Days Discharge Instructions: May shower and wash wound with dial antibacterial soap and water prior to dressing change. Peri-Wound Care: Sween Lotion (Moisturizing lotion) 2 x Per Week/7 Days Discharge Instructions:  Apply moisturizing lotion as directed Prim Dressing: IODOFLEX 0.9% Cadexomer Iodine Pad 4x6 cm 2 x Per Week/7 Days ary Discharge Instructions: Apply Iodoflex or Iodosorb to wound bed as instructed Secondary Dressing: Woven Gauze Sponge, Non-Sterile 4x4 in 2 x Per Week/7 Days Discharge Instructions: Apply over primary dressing as directed. Secondary Dressing: ABD Pad, 8x10 2 x Per Week/7 Days Discharge Instructions: Apply over primary dressing as directed. Compression Wrap: Kerlix Roll 4.5x3.1  (in/yd) 2 x Per Week/7 Days Discharge Instructions: Apply Kerlix and Coban compression as directed. Compression Wrap: Coban Self-Adherent Wrap 4x5 (in/yd) 2 x Per Week/7 Days Discharge Instructions: Apply over Kerlix as directed. Wound #28 - Lower Leg Wound Laterality: Left, Lateral Cleanser: Soap and Water Every Other Day/15 Days Discharge Instructions: May shower and wash wound with dial antibacterial soap and water prior to dressing change. Peri-Wound Care: Sween Lotion (Moisturizing lotion) Every Other Day/15 Days Discharge Instructions: Apply moisturizing lotion as directed Prim Dressing: Santyl Ointment Every Other Day/15 Days ary Discharge Instructions: Apply to wound bed as instructed Secondary Dressing: Woven Gauze Sponge, Non-Sterile 4x4 in Every Other Day/15 Days Discharge Instructions: Apply over primary dressing as directed. Compression Wrap: Kerlix Roll 4.5x3.1 (in/yd) Every Other Day/15 Days Discharge Instructions: Apply Kerlix and Coban compression as directed. Compression Wrap: Coban Self-Adherent Wrap 4x5 (in/yd) Every Other Day/15 Days Discharge Instructions: Apply over Kerlix as directed. Wound #29 - Lower Leg Wound Laterality: Right, Lateral, Proximal Cleanser: Soap and Water 2 x Per Week/7 Days Discharge Instructions: May shower and wash wound with dial antibacterial soap and water prior to dressing change. Peri-Wound Care: Sween Lotion (Moisturizing lotion) 2 x Per Week/7 Days Discharge Instructions: Apply moisturizing lotion as directed Prim Dressing: Iodosorb Gel 10 (gm) Tube 2 x Per Week/7 Days ary Discharge Instructions: Apply to wound bed as instructed Secondary Dressing: Woven Gauze Sponge, Non-Sterile 4x4 in 2 x Per Week/7 Days Discharge Instructions: Apply over primary dressing as directed. Secondary Dressing: ABD Pad, 8x10 2 x Per Week/7 Days Discharge Instructions: Apply over primary dressing as directed. Compression Wrap: Kerlix Roll 4.5x3.1 (in/yd) 2 x  Per Week/7 Days Discharge Instructions: Apply Kerlix and Coban compression as directed. Compression Wrap: Coban Self-Adherent Wrap 4x5 (in/yd) 2 x Per Week/7 Days Discharge Instructions: Apply over Kerlix as directed. Wound #30 - T Fourth oe Wound Laterality: Right Prim Dressing: KerraCel Ag Gelling Fiber Dressing, 4x5 in (silver alginate) Every Other Day/30 Days ary Discharge Instructions: Apply silver alginate to wound bed as instructed Secondary Dressing: Woven Gauze Sponges 2x2 in Every Other Day/30 Days Discharge Instructions: Apply over primary dressing as directed. Secured With: Child psychotherapist, Sterile 2x75 (in/in) Every Other Day/30 Days Discharge Instructions: Secure with stretch gauze as directed. Wound #5 - Lower Leg Wound Laterality: Right, Medial Cleanser: Soap and Water 2 x Per Week/7 Days Discharge Instructions: May shower and wash wound with dial antibacterial soap and water prior to dressing change. Peri-Wound Care: Sween Lotion (Moisturizing lotion) 2 x Per Week/7 Days Discharge Instructions: Apply moisturizing lotion as directed Prim Dressing: Iodosorb Gel 10 (gm) Tube 2 x Per Week/7 Days ary Discharge Instructions: Apply to wound bed as instructed Secondary Dressing: Woven Gauze Sponge, Non-Sterile 4x4 in 2 x Per Week/7 Days Discharge Instructions: Apply over primary dressing as directed. Secondary Dressing: ABD Pad, 8x10 2 x Per Week/7 Days Discharge Instructions: Apply over primary dressing as directed. Compression Wrap: Kerlix Roll 4.5x3.1 (in/yd) 2 x Per Week/7 Days Discharge Instructions: Apply Kerlix and Coban compression as directed. Compression Wrap: Coban  Self-Adherent Wrap 4x5 (in/yd) 2 x Per Week/7 Days Discharge Instructions: Apply over Kerlix as directed. Patient Medications llergies: Sulfa (Sulfonamide Antibiotics), Levaquin, penicillin A Notifications Medication Indication Start End wound infectrion 12/26/2020 cefdinir DOSE oral  300 mg capsule - 1 capsule oral bid for afurther 7 days Electronic Signature(s) Signed: 12/26/2020 5:55:15 PM By: Baruch Gouty RN, BSN Signed: 12/27/2020 2:27:07 PM By: Linton Ham MD Signed: 12/27/2020 2:27:07 PM By: Linton Ham MD Previous Signature: 12/26/2020 4:00:41 PM Version By: Linton Ham MD Entered By: Baruch Gouty on 12/26/2020 16:56:11 -------------------------------------------------------------------------------- Problem List Details Patient Name: Date of Service: [name], [name] 12/26/2020 2:00 PM Medical Record Number: [id number] Patient Account Number: [id number] Date of Birth/Sex: Treating RN: 1951-06-26 (69 y.o. 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 02/24/2015 No Yes I87.331 Chronic venous hypertension (idiopathic) with ulcer and inflammation of right 10/04/2016 No Yes lower extremity L89.616 Pressure-induced deep tissue damage of right heel 12/14/2019 No Yes L97.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 05/31/2020 No Yes L97.528 Non-pressure chronic ulcer of other part of left foot with other specified 12/05/2020 No Yes severity I70.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 10/31/2020 No Yes lower leg I70.248 Atherosclerosis of native arteries of left leg with ulceration of other part of 10/31/2020 No Yes lower leg Inactive Problems ICD-10 Code Description Active Date  Inactive Date 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 12/26/2020 2:00 PM Medical Record Number: [id number] Patient Account Number: [id number] Date of Birth/Sex: Treating RN: 1951-02-11 (69 y.o. [name] Primary Care Provider: [name] Other Clinician: Referring Provider: Treating Provider/Extender: [name] in Treatment: 457 Subjective History of Present Illness (HPI) this is a patient who initially came to [place] for wounds on the medial malleoli bilaterally as well as her upper medial lower extremities bilaterally. These wounds eventually healed with assistance of Apligraf's bilaterally. While this was occurring she developed the current wound which opened into a fairly substantial wound on the right lower extremity. These are mostly secondary to venous stasis physiology however the patient also has underlying scleroderma, pulmonary hypertension. The wound has been making good progress lately with the Hydrofera Blue-based dressing. 03/17/2015; patient had a arterial evaluation a year ago. Her right ABI was 0.86 left was 1.0. T brachial index was 0.41 on the right 0.45 on the left. Her oe bilateral common femoral artery waveforms were triphasic. Her white popliteal posterior tibial artery and anterior tibial artery waveforms were monophasic with good amplitude. Luteal artery  waveforms were biphasic it was felt that her bilateral great toe pressures are of normal although  adequate for tissue healing. 03/24/2015. The condition of this wound is not really improved that. He was covered with as fibrinous surface slough and eschar. This underwent an aggressive debridement with both a curette and scalpel. I still cannot really get down to what I can would consider to be a viable surface. There is no evidence of infection. Previous workup for ischemia roughly a year ago was negative nevertheless I think that continues to be a concern 04/07/15. The patient arrived for application of second Apligraf. Once again the surface of this wound is certainly less viable than I would like for an advanced treatment option. An aggressive debridement was done. She developed some arteriolar bleeding which required that along pressure and silver alginate. 04/21/15: change in the condition of this wound. Once again it is covered in a gelatinous surface slough. After debridement today surface of the wound looks somewhat better but now a heeling surface 05/05/15 Apligraf #4 applied.Still a lot of slough on this wound. 05/19/15 Apligraf #5 applied. Still a lot of slough on this wound I did not aggressively remove this 06/02/15; continued copious amounts of surface slough. This debridement fairly easily. 06/08/2015 -- the last time she had a venous duplex study done was over 3 years ago and the surgery was prior to that. I have recommended that she sees Dr. early for a another opinion regarding a repeat venous duplex and possibly more endovenous ablation of vein stripping of micro-phlebotomies. 06/16/15; wound has a gelatinous surface eschar that the debridement fairly easily to a point. I don't disagree with the venous workup and perhaps even arterial re-evaluation. She is on prednisone 5 mg and continue his medications for her pulmonary artery hypertension I am not sure if the latter have any wound  care healing issues I would need to investigate this. 06/23/15 continues with a gelatinous surface eschar with of fibrinous underlying. What I can see of this wound does not look unhealthy however I just can't get this material which I think is 2 different layers off. Empiric culture done 06/30/15; unfortunately not a lot of change in this wound. A gelatinous surface eschar is easily removed however it has a tight fibrinous surface underneath the. 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 The wound comes back and basically and unchanged state the. She has a gelatinous surface that is more easily removed however there is a tightly fibrinous surface underneath the. There is no evidence of infection. She has a vascular follow-up next month. I would have to inject her in order to do a more aggressive debridement of this area 07/21/15 the wound is roughly in the same state albeit the debridement is done with greater ease. There is less of the fibrinous eschar underneath the. There is no evidence of infection. She has follow-up with vascular surgery next week. No evidence of surrounding infection. Her original distal wounds healed while this one formed. 07/28/15; wound is easier to debride. No wound erythema. She is seeing Dr. [name] next week. 08/18/15 Has been to [facility] for repeat ablation. Have been using medihoney pad with some improvement 08/25/15; absolutely no change in the condition of this wound in either its overall size or surface condition in many months now. At one point I had this down to [place] healthier surface I think with [facility] however this did not actually progress towards closure. Do not believe that the wound has actually deteriorated in terms of volume at all. We have been  using a medihoney pad which allows easier debridement of the gelatinous eschar but again no overall actual improvement. the patient is going towards an ablation with pain and vascular which  I think is scheduled for next week. The only other investigation that I could first see would be a biopsy. She does have underlying scleroderma 09/02/15 eschar is much easier to debridement however the base of this does not look particularly vibrant. We changed to Iodoflex. The patient had her ablation earlier this week 09/09/15 again the debridement over the base of this wound is easier and the base of the wound looks considerably better. We will continue the Iodoflex. Dr. Tawni Millers has expressed his satisfaction with the result of her ablation 09/15/15 once again the wound is relatively free of surface eschar. No debridement was done today. It has a pale-looking base to it. although this is not as deep as it once was it seems to be expanding especially inferiorly. She has had recent venous ablation but this is no closer to healing.I've gone ahead and done a punch biopsy this from the inferior part of the wound close to normal skin 09/22/15: the wound is relatively free of surface eschar. There is some surrounding eschar. I'm not exactly sure at what level the surface is that I am seeing. 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 No evidence of atypical infection, malignancy. 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 No debridement was done 09/29/15; the wound surface is relatively free of surface eschar. There is eschar surrounding the walls of the wound. Aside from the improvement in the amount of surface slough. This wound has not progressed any towards closure. There is not even a surface that looks like there at this is ready. There is no evidence of any infection nor maligancy based on biopsy I did on 9/15. I continued with the Iodoflex however I am looking towards some alternative to try to promote some closure or filling in of this surface. Consider triple layer Oasis. Collagen did not result in adequate control of the surface slough 10/13/15;  the patient was in hospital last week with severe anemia. The wound looks somewhat better after debridement although there is widening medially. There is no evidence of infection. 10/20/15; patient's wound on the right lateral lower leg is essentially unchanged. This underwent a light surface debridement and in general the debridement is easier and the surface looks improved. I noted in doing this on the side of the wound what appears to be a piece of calcium deposition. The patient noted that she had previous things on the tips of her fingers. 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 She follows with rheumatology and I have asked them to talk to her about this. In view of that S the nonhealing ulcer may have something to do with calcinosis and also unrelieved Raynaud's disease in this area. I should note that her original wounds on the right and left medial malleolus and the inner aspects of both legs just below the knees did however heal over 10/30/15; the patient's wound on the right lateral lower leg is essentially unchanged. I was able to remove some calcified material from the medial wound edge. 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 Otherwise the wound appears essentially unchanged there is less adherent surface eschar. Some of the calcified material was sent to pathology for  analysis 11/17/15. The patient's wound on the right lateral leg is essentially unchanged. Wider Medially. 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 There is some surface slough but I did not debridement this. No evidence of surrounding infection. The wound has  calcinosis and ossification which may be connected with her underlying scleroderma. This will make healing difficult 12/15/15; the wound has less depth surface has a fibrinous slough and calcifications in the wound edge no evidence of infection 12/22/15; the wound definitely has less Fibrinous slough on the base. Calcifications around the wound edges are still evident. Although the wound bed looks healthier it is still pale in appearance. Previous biopsy did not show malignancy 01/04/15; surgical debridement of nonviable slough and subcutaneous tissue the wound cleans up quite nicely but appears to be expanding outward calcifications around the wound edges are still there. 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 She is to see her rheumatologist I'll see if he has any opinions on this. My punch biopsy done in September did not show calcifications although these are clearly evident. 01/19/16 light selective debridement of nonviable surface slough. There is epithelialization medially. This gives me reason for cautious optimism. She has been to see her rheumatologist, there is nothing that can be done for this type of soft tissue calcification associated with scleroderma 02/02/16 no debridement although there is a light surface slough. She has 2 peninsulas of skin 1 inferiorly and one medially. We continue to make a slight and slow but definite progressive here 02/16/16; light surface debridement with more attention to the circumference of the wound bed where the fibrinous eschar is more prevalent. No calcifications detected. She seems to have done nicely with the [facility] with some epithelialization and some improvement in the overall wound volume. 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 Continued attention to the circumference of  the wound where the fibrinous eschar in calcinosis or prevalent. No calcifications were detected. She has continued improvement with Hydrofera Blue. The wound is no longer as deep 03/15/16 surgical debridement done to remove surface escha Especially around the circumference of the wound where there is nonviable subcutaneous tissue. In spite of this there is considerable improvement in the overall dimensions and depth of the wound. Islands of epithelialization are seen especially medially inferiorly and superiorly to a lesser extent. She is using Hydrofera Blue at home 03/29/16; surgical debridement done to remove surface eschar and nonviable subcutaneous tissue. This cleans up quite nicely mention slightly larger in terms of length and width however depth is less 04/12/16; continued gradual improvement in terms of depth and the condition of the wound base. Debridement is done. Continuing long standing Hydrofera Blue at home with Kerlix Coban wraps 04/26/16; continued gradual improvement in terms of depth and management as well as condition of the wound base. Surgical debridement done she continues with Hydrofera Blue. 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 She initially came to this clinic venous insufficiency ulcers which have long since healed 05/17/16 continued improvement in terms of the depth and measurements of this wound although she has a tightly adherent fibrinous slough each time. We've been  continued with long standing Hydrofera Blue which seems to done as well for this wound is many advanced treatment options. 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 She also has chronic venous insufficiency. She has an irritation on her lateral right ankle secondary to our wraps 05/10/16; wound appears to be smaller especially on the medial aspect and especially in the width. Wound was debridement surface looks better. She is  also been in the hospital apparently with anemia again she tells me she had an endoscopy. Since she got home after 3 days which I believe was sometime last week she has had an irritated painful area on the right lateral ankle surrounding the lateral malleolus 05/31/16; much more adherent surface slough today then recently although I don't think the dimensions of changed that much. A more aggressive debridement is required. The irritated area over her medial malleolus is more pruritic and painful and I don't think represents cellulitis 06/14/16; no major change in her wound dimensions however there is more tightly adherent surface slough which is disappointing. As well as she appears to have a new small area medially. Furthermore an irritated uncomfortable area on the lateral aspect of her right foot just below the lateral malleolus. 06/21/16; I'm seeing the patient back in follow-up for the new areas under her major wound on the right anterior leg. She has been using Hydrofera Blue to this area probably for several months now and although the dressings seem to be helping for quite a period of time I think things have stagnated lately. She comes in today with a relatively tight adherent surface slough and really no changes in the wound shape or dimension. The 2 small areas she had inferiorly are tiny but still open they seem improved this well. There is no uncontrolled edema and I don't think there is any evidence of cellulitis. 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 Patient has had vascular evaluations both venous and arterial. I have biopsied this area. There is no obvious infection. The worrisome thing today is that she seems to be developing areas of erythema and epithelial damage on the medial aspect of the right foot. Also to a lesser degree inferior to the actual wound itself. Again I see  no obvious changes to suggest cellulitis however as this is the only treatable option I will probably give her antibiotics. 07/13/16 no major change in the lady's large anterior right leg wound. Still covered with a very tightly adherent surface slough which is difficult to debridement. There is less erythema around this, culture last week grew pseudomonas I gave her ciprofloxacin. The area on her lateral right malleolus looks better- 08/02/16 the patient's wounds continued to decline. Her original large anterior right leg wound looks deeper. Still adherent surface slough that is difficult to debridement. She has a small area just below this and a punched-out wound just below her lateral malleolus. In the meantime she is been in hospital with apparently an upper GI bleed on Plavix and aspirin. She is now just on Plavix she received 3 units of packed cells 08/23/16; since I last saw this 3 weeks ago, the open large area on her right leg looks about the same syrup. She has a small satellite lesion just underneath this. The area on her medial right ankle is now a deep necrotic wound. I attempted to debridement this however there  is just too much pain. 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 She follows with vascular surgery and is scheduled for an angiogram in early September 09/06/16; the patient is going for an arteriogram tomorrow. Her original large wound on the right calf is about the same the satellite lesion underneath it is about the same however the area on her medial ankle is now deeper with exposed tendon. I am no longer attempting to debride these wounds 09/20/16; the patient has undergone a right femoral endarterectomy and Dacron patch angioplasty. This seems to have helped the flow in her right leg. 10/04/16; Arrived today for aggressive debridement of the wounds on her right calf the original wound the one beneath it and a  difficult area over her right lateral leg just above the lateral malleolus. 10/25/16; her 3 open wounds are about the same in terms of dimensions however the surface appears a lot healthier post debridement. Using Iodoflex 10/18/16 we have been using Iodoflex to her wounds which she tolerated with some difficulty. 10/11/16; has been using Santyl for a period of time with some improvement although again very adherent surface slough would prevent any attempted healing this. She has a original wound on the left calf, the satellite underneath that and the most recent wound on the right medial ankle. She has completed revascularization by Dr. early and has had venous ablation earlier. Want to go back to Iodoflex to see if week and get a healthier surface to this wound bed failing this I think she'll need to be taken to the OR and I am prepared to call Dr. Marla Roe to discuss this. She is obviously not a good candidate for general anesthesia however.; 11/08/16; I put her on Iodoflex last time to see if I can get the wound bed any healthier and unfortunately today still had tremendous surface slough. 11/15/16; 4 weeks' worth of Iodoflex with not much improvement. Debridement on the major wounds on her left anterior leg is easier however this does not maintain from week to week. The punched-out area on her medial right ankle 11/29/16; I attempted to change the patient last visit to [facility] however she states this burned and was very uncomfortable therefore we gave her permission to go back to the eye out of complex which she already had at home. Also she noted a lot of pain and swelling on the lateral aspect of her leg before she traveled to [facility] for the holidays, I called her in doxycycline over the phone. This seems to have helped 12/06/16; Wounds unchanged by in large. Using Iodoflex 12/13/16; her wounds today actually looks somewhat better. The area on the right lateral lower  leg has reasonably healthy-looking granulation and perhaps as actually filled and a bit. Debridement of the 2 wounds on the medial calf is easier and post debridement appears to have a healthier base. We have referred her to Dr. Migdalia Dk for consideration of operative debridement 12/20/16; we have a quick note from Dr. Merri Ray who feels that the patient needs to be referred to an academic center/plastic surgeon. This is due to the complexity of the patient's medical issues as it applies to anything in the OR. We have been using Iodoflex 12/27/16; in general the wounds on the right leg are better in terms of the difficult to remove surface slough. She has been using Iodoflex. She is approved for Apligraf which I anticipated ordering in  the next week or 2 when we get a better-looking surface 01/04/16 the deep wounds on the right leg generally look better. Both of them are debrided further surface slough. The area on the lateral right leg was not debrided. She is approved for Apligraf I think I'll probably order this either next week or the week after depending on the surface of the wounds superiorly. We have been using Iodoflex which will continue until then 01/11/16; the deep wounds on the right leg again have a surface slough that requires debridement. I've not been able to get the wound bed on either one of these wounds down to what would be acceptable for an advanced skin stab-like Apligraf. The area on the medial leg has been improving. We have been using hot Iodoflex to all wounds which seems to do the best at at least limiting the nonviable surface 01/24/17; we have continued Iodoflex and all her wound areas. Her debridement Gen. he is easier and the surface underneath this looks viable. Nevertheless these are large area wounds with exposed muscle at least on the anterior parts. We have ordered Apligraf's for 2 weeks from now. The patient will be away next week 02/07/17; the patient was close  to have first Apligraf today however we did not order one. I therefore replaced her Iodoflex. She essentially has 3 large punched- out areas on her right anterior leg and right medial ankle. 02/11/17; Apligraf #1 02/25/17; Apligraf #2. In general some improvement in the right medial ankle and right anterior leg wounds. The larger wound medially perhaps some better 03/11/17; Apligraf #3. In general continued improvement in the right medial ankle and the right anterior leg wounds. 03/25/17 Apligraf #4. In general continued improvement especially on the right medial ankle and the lower 04/08/17; Apligraf #5 in general continued improvement in all wound sites. 04/22/17; post Apligraf #5 her wound beds continued to look a lot better all of them up to the surface of the surrounding skin. Had a caramel-colored slough that I did not debrided in case there is residual Apligraf effect. The wounds are as good as I have seen these looking quite some time. 04/29/17; we applied Tangent and last week after completing her Apligraf. Wounds look as though they've contracted somewhat although they have a nonviable surface which was problematic in the past. Apparently she has been approved for further Apligraf's. We applied Iodosorb today after debridement. 05/06/17; we're fortunate enough today to be able to apply additional Apligraf approved by her insurance. In general all of her wounds look better Apligraf #6 05/24/17; Apligraf #7 continued improvement in all wounds o3 06/10/17 Apligraf #8. Continued improvement in the surface of all wounds. Not much of an improvement in dimensions as I might a follow 06/24/17 Apligraf #9 continued general improvement although not as much change in the wound areas I might of like. She has a new open area on the right anterior lateral ankle very small and superficial. She also has a necrotic wound on the tip of her right index finger probably secondary to severe uncontrolled Raynaud's  phenomenon. She is already on sildenafil and already seen her rheumatologist who gave her Keflex. 07/08/17; Apligraf #10. Generalized improvement although she has a small additional wound just medial to the major wound area. 07/22/17; after discussion we decided not to reorder any further Apligraf's although there is been considerable improvement with these it hasn't been recently. The major wound anteriorly looks better. Smaller wounds beneath this and the more recent one  and laterally look about the same. The area on the right lateral lower leg looks about the same 07/29/17 this is a patient who is exceedingly complex. She has advanced scleroderma, crest syndrome including calcinosis, PAD status post revascularization, chronic venous insufficiency status post ablations. She initially presented to this clinic with wounds on her bilateral lower legs however these closed. More recently we have been dealing with a large open area superiorly on the right anterior leg, a smaller wound underneath this and then another one on the right medial lower extremity. These improve significantly with 10 Apligraf applications. Over the last 2-3 weeks we are making good progress with Hydrofera Blue and these seem to be making progress towards closure 08/19/17; wounds continued to make good improvement with Hydrofera Blue and episodic aggressive debridements 08/26/17; still using Hydrofera Blue. Good improvements 09/09/17; using Hydrofera Blue continued improvement. Area on the lateral part of her right leg has only a small remaining open area. The small inferior satellite region is for all intents and purposes closed. Her major wound also is come in in terms of depth and has advancing epithelialization. 09/16/17; using Hydrofera Blue with continued improvement. The smaller satellite wound. We've closed out today along with a new were satellite wound medially. The area on her medial ankle is still open and her major wound is  still open but making improvement. All using Hydrofera Blue. Currently 09/30/17; using Hydrofera Blue. Still a small open area on the lateral right ankle area and her original major wound seems to be making gradual and steady improvement. 10/14/17; still using Hydrofera Blue. Still too small open areas on the right lateral ankle. Her original major wound is horizontal and linear. The most problematic area paradoxically seems to be the area to the medial area wears I thought it would be the lateral. 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 10/28/17; still using Hydrofera Blue. Right lateral ankle has a very small open area superiorly on the most lateral part of the wound. Her original open wound has 2 open areas now separated by normal skin and we've redefined this. 11/11/17; still using Hydrofera Blue area and right lateral ankle continues to have a small open area on mostly the lateral part of the wound. Her original wound has 2 small open areas now separated by a considerable amount of normal skin 11/28/17; the patient called in slightly before Thanksgiving to report pain and erythema above the wound on the right leg. In the past this is responded well to treatment for cellulitis and I gave her over the phone doxycycline. She stated this resulted in fairly abrupt improvement. We have been using Hydrofera Blue for a prolonged period of time to the larger wound anteriorly into the remaining wound on her right right lateral ankle. The latter is just about closed with only a small linear area and the bottom of the [place]. 12/02/17; use endoform and left the dressing on since last visit. There is no tenderness and no evidence of infection. 12/16/17; patient has been using Endoform but not making much progress. The 2 punched out open areas anteriorly which were the reminiscence of her major wound appear deeper. The area on the lateral aspect of her right  calf also appears deeper. Also she has a puzzling tender swelling above her wound on the right leg. This seems larger than last time. Just above her wounds there appears to be some fluctuance in this area it is not  erythematous and there is no crepitus 12/30/17; patient has been using Endoform up until last week we used Hydrofera Blue. Ultrasound of the swelling above her 2 major wounds last time was negative for a fluid collection. I gave her cefaclor for the erythema and tenderness in this area which seems better. Unfortunately both punched out areas anteriorly and the area on her right medial lower leg appear deeper. In fact the lateral of the wounds anteriorly actually looks as though it has exposed tendon and/or muscle sheath. She is not systemically unwell. She is complaining of vaginitis type symptoms presumably Candida from her antibiotics. 01/06/18; we're using santyl. she has 2 punched out areas anteriorly which were initially part of a large wound. Unfortunately medially this is now open to tendon/muscle. All the wounds have the same adherent very difficult to debride surface. 01/20/18; 2 week follow-up using Santyl. She has the 2 punched out areas anteriorly which were initially part of her large surface wound there. Medially this still has exposed muscle. All of these have the same tightly adherent necrotic surface which requires debridement. PuraPly was not accepted by the patient's insurance however her insurance I think it changed therefore we are going to run Apligraf to gain 02/03/18; the patient has been using Santyl. The wound on the right lateral ankle looks improved and the 2 areas anteriorly on the right leg looks about the same. The medial one has exposed muscle. The lateral 1 requiresdebridement. We use PuraPly today for the first week 02/10/18; PuraPly #2. The patient has 3 wounds. The area on the right lateral ankle, 2 areas anteriorly that were part of her original large wound in  this area the medial one has exposed muscle. All of the wounds were lightly debrided with a number 3 curet. PuraPly #2 applied the lateral wound on the calf and the right lateral ankle look better. 02/17/18; PuraPly #3. Patient has 3 wounds. The area on the right lateral ankle in 2 areas internally that were part of her original large wound. The lateral area has exposed muscle. She arrived with some complaints of pain around the right ankle. 02/24/18; PuraPly #4; not much change in any of the 3 wound areas. Right lateral ankle, right lateral calf. Both of these required debridement with a #3 curet. She tolerates this marginally. The area on the medial leg still has exposed muscle. Not much change in dimensions 03/03/18 PuraPly #5. The area on the medial ankle actually looks better however the 2 separate areas that were original parts of the larger right anterior leg wound look as though they're attempting to coalesce. 03/10/18; PuraPly #6. The area on the medial ankle actually continues to look and measures smaller however the 2 separate areas that were part of the original large wound on the right anterior leg have now coalesced. There hasn't been much improvement here. The lateral area actually has underlying exposed muscle 03/17/18-she is here in follow-up evaluation for ulcerations to the right lower extremity. She is voicing no complaints or concerns. She tolerated debridement. Puraply#7 placed 03/24/18; difficult right lower extremity ulcerations. PuraPly #8 place. She is been approved for [employer]. She did very well with Apligraf today however she is apparently reached her "lifetime max" 03/31/18; marginal improvement with PuraPly although her wounds looked as good as they have in several weeks today. Used TheraSkins #1 04/14/18 TheraSkin #2 today 04/28/18 TheraSkins #3. Wound slightly improved 05/12/18; TheraSkin skin #4. Wound response has been variable. 05/27/18 TheraSkin #5. Generally improvement  in  all wound areas. I've also put her in 3 layer compression to help with the severe venous hypertension 06/09/18; patient is done quite well with the TheraSkins unfortunately we have no further applications. I also put her in 3 layer compression last week and that really seems to of helped. 06/16/18; we have been using silver collagen. Wounds are smaller. Still be open area to the muscle layer of her calf however even that is contracted somewhat. She tells me that at night sometimes she has pain on the right lateral calf at the site of her lower wound. Notable that I put her into 3 layer compression about 3 weeks ago. She states that she dangles her leg over the bed that makes it feel better but she does not describe claudication during the day ooShe is going to call her secondary insurance to see if they will continue to cover advanced treatment products I have reviewed her arterial studies from 01/22/17; this showed an ABI in the right of 1 and on the left noncompressible. TBI on the right at 0.30 on the left at 0.34. It is therefore possible she has significant PAD with medial calcification falsely elevating her ABI into the normal range. I'll need to be careful about asking her about this next week it's possible the 3 layer compression is too much 06/23/18; was able to reapply TheraSkin 1 today. Edema control is good and she is not complaining of pain no claudication 07/07/18;no major change. New wound which was apparently a taper removal injury today in our clinic between her 2 wounds on the right calf TheraSkins #2 07/14/18; I think there is some improvement in the right lateral ankle and the medial part of her wound. There is still exposed muscle medially. 07/28/18; two-week follow-up. TheraSkins#3. Unfortunately no major change. She is not a candidate I don't think for skin grafting due to severe venous hypertension associated with her scleroderma and pulmonary hypertension 08/11/18 Patient is here  today for her [employer] application #59 (#5 of the second set). She seems to be doing well and in the base of the wound appears to show some progress at this point. This is the last approved [employer]askin of the second set. 08/25/18; she has completed TheraSkin. There has been some improvement on the right lateral calf wound as well as the anterior leg wounds. The open area to muscle medially on the anterior leg wound is smaller. I'm going to transition her back to [facility] under Kerlix Coban change every second day. She reports that she had some calcification removed from her right upper arm. We have had previous problems with calcifications in her wounds on her legs but that has not happened recently 09/08/18;using Hydrofera Blue on both her wound areas. Wounds seem to of contracted nicely. She uses Kerlix Coban wrap and changes at home herself 09/22/2018; using Hydrofera Blue on both her wound areas. Dimensions seem to have come down somewhat. There is certainly less depth in the medial part of the mid tibia wound and I do not think there is any exposed muscle at this point. 10/06/2018; 2-week follow-up. Using [facility] on her wound areas. Dimensions have come down nicely both on the right lateral ankle area in the right mid tibial area. She has no new complaints 10/20/2018; 2-week follow-up. She is using Hydrofera Blue. Not much change from the last time she was here. The area on the lateral ankle has less depth although it has raised edges on one side. I attempted to  remove as much of the raised edge as I could without creating more additional wounds. The area on the right anterior mid tibia area looks the same. 11/03/2018; 2-week follow-up she is using Hydrofera Blue. On the right anterior leg she now has 2 wounds separated by a large area of normal skin. The area on the medial part still has I think exposed muscle although this area itself is a lot smaller. The area laterally has some depth.  Both areas with necrotic debris. ooThe area on her right lateral ankle has come in nicely 11/17/2018; patient continues to use Hydrofera Blue. We have been increasing separation of the 2 wounds anteriorly which were at one point joint the area on the right lateral calf continues to have I think some improvement in depth. 12/08/2018; patient continues to use Hydrofera Blue. There is some improvement in the area on the right lateral calf. The 2 areas that were initially part of the original large wound in the mid right tibia are probably about the same. In fact the medial area is probably somewhat larger. We will run puraply through the patient's insurance 12/22/2018; she has been using Hydrofera Blue. We have small wounds on the right lateral calf and 2 small areas that were initially part of a large wound in the right mid tibia. We applied pure apply #1 today. 12/29/2018; we applied puraply #2. Her wounds look somewhat better especially on the right lateral calf and the lateral part of her original wound in the mid tibial area. 01/05/2019; perhaps slightly improved in terms of wound bed condition but certainly not as much improvement as I might of liked. Puraply #3 1/13: we did not have a correct sized puraply to apply. wounds more pinched out looking, I increased her compression to 3 layer last week to help with significant multilevel venous hypertension. Since then I've reviewed her arterial status. She has a right femoral endarterectomy and a distal left SFA stent. She was being followed by Dr. [name] for a period however she does not appear to have seen him in 3 years. I will set up an appointment. 1/20. The patient has an additional wound on the right lateral calf between the distal wound and proximal wounds. We did not have Puraply last week. Still does not have a follow-up with Dr. early 1/27: Follow-up with Dr. early has been arranged apparently with follow-up noninvasive studies. Wounds are  measuring roughly the same although they certainly look smaller 2/3; the patient had non invasive studies. Her ABIs on the right were 0.83 and on the left 1.02 however there was no great toe pressure bilaterally. Also worrisome monophasic waveforms at the PTA and dorsalis pedis. We are still using Puraply. We have had some improvement in all of the wounds especially the lateral part of the mid tibial area. 2/10; sees Dr. early of vein and vascular re-arterial studies next week. Puraply reapplied today. 2/17; Dr. early of vein and vascular his appointment is tomorrow. Puraply reapplied after debridement of all wounds 2/24; the patient saw Dr. early I reviewed his note. sHe noted the previous right femoral endarterectomy with a Dacron patch. He also noted the normal ABI and the monophasic waveforms suggesting tibial disease. Overall he did not feel that she had any evidence of arterial insufficiency that would impair her wound healing. He did note her venous disease as well. He suggested PRN follow-up. 3/2; I had the last puraply applied today. The original wounds over the mid tibia area are improved  where is the area on the right lower leg is not 3/9; wounds are smaller especially in the right mid tibia perhaps slightly in the right lateral calf. We finished with puraply and went to endoform today 3/23; the patient arrives after 2 weeks. She has been using endoform. I think all of her wounds look slightly better which includes the area on the right lateral calf just above the right lateral malleolus and the 2 in the right mid tibia which were initially part of the same wound. 4/27; TELEHEALTH visit; the patient was seen for telehealth visit today with her consent in the middle of the worldwide epidemic. Since she was last here she called in for antibiotics with pain and tenderness around the area on the right medial ankle. I gave her empiric doxycycline. She states this feels better. She is using  endoform on both of these areas 5/11 TELEHEALTH; the patient was seen for telehealth visit today. She was accompanied at home by her husband. She has severe pulmonary hypertension accompanied scleroderma and in the face of the Covid epidemic cannot be safely brought into our clinic. We have been using endoform on her wound areas. There are essentially 3 wound areas now in the left mid tibia now 2 open areas that it one point were connected and one on the right lateral ankle just above the malleolus. 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 She cannot come to clinic. The wounds were reviewed today via telehealth. She has severe chronic venous hypertension which I think is centrally mediated. She has wounds on her right anterior tibia and right lateral ankle area. These are chronic. She has been using endoform. 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 We have been following her from telehealth. She has severe chronic venous hypertension which may be mostly centrally mediated secondary to right heart heart failure. She has wounds on her right anterior tibia and right lateral ankle these are chronic we have been using endoform 6/22; TELEHEALTH; this patient was seen today via telehealth. 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 Since the last time we had contact with this patient she developed some pain and erythema around the wound on her right lateral malleolus/ankle and we put in antibiotics for her. This is resulted in good improvement with resolution of the erythema and tenderness. I changed her to  silver alginate last time. We had been using endoform for an extended period of time 7/13; TELEHEALTH; this patient was seen today via telehealth. 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 She would continue to be at a prohibitive risk to be brought into our clinic unless this was absolutely necessary. These visits have been done with her approval as well as her husband. We have been using silver alginate to the areas on the right mid tibia and right lateral lower leg. 7/27 TELEHEALTH; patient was seen along with her husband today via telehealth. 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 We changed her to sample last visit. She has 2 areas  a chronic wound on her right mid tibia and one just above her ankle. These were not the original wounds when she came into this clinic but she developed them during treatment 8/17; she comes in for her first face-to-face visit in a long period. She has a remaining area just medial to the right tibia which is the last open part of her large wound across this area. She also has an area on the right lateral lower leg. We prescribed Santyl last telehealth visit but they were concerned that this was making a deeper so they put silver alginate on it last week. Her husband changes the dressings. 8/31; using Santyl to the 2 wound areas some improvement in wound surfaces. Husband has surgery in 2 weeks we will put her out 3 weeks. Any of the advanced treatment options that I can think of that would be eligible for this wound would also cause her to have to come in weekly. The risk that the patient is just too high 9/21. Using Santyl to the 2 wound areas. Both of these are somewhat better although the medial mid tibia area still has exposed muscle. Lateral ankle requiring debridement. Using Santyl 10/12; using Santyl to the 2 wound areas. One on  the right lateral ankle and the other in the medial calf which still has exposed muscle. Both areas have come down in size and have better looking surfaces. She has made nice progress with santyl 11/2; we have been using Santyl to the 2 wound areas. Right lateral ankle and the other in the mid tibia area the medial part of this still has exposed muscle. 11/23/2019 on evaluation today patient appears to be doing about the same really with regard to her wounds. She is actually not very pleased with how things seem to be progressing at this point she tells me that she really has not noted much improvement unfortunately. With that being said there is no signs of active infection at this time. There is some slough buildup noted at this time which again along with some dry skin around the edges of the wound I think would benefit her to try to debride some of this away. Fortunately her pain is doing fairly well. She still has exposed muscle in the right medial/tibial area. 12/14; TELEHEALTH; she was changed to [facility] to the right calf wound and right lateral ankle wound when she was here last time. Unfortunately since then she had a fall with a pelvic fracture and a fracture of her wrist. She was apparently hospitalized for 5 days. I have not looked at her discharge summary. She apparently came out of the hospital with a blister on her right heel. She was seen today via telehealth by myself and our case manager. The patient and her husband were present. She has been using Hydrofera Blue at our direction from the last time she was in the clinic. There is been no major improvement in fact the areas appear deeper and with a less viable surface 01/04/2020; TELEHEALTH; the patient was seen today in accompaniment of her husband and our nurse. She has 3 open areas 1 on the right medial mid tibia, one on the right lateral ankle and a large eschared pressure area on the right heel. We have been using Iodoflex to  the 2 original wounds. 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 It is simply too perilous for her to be seen in any other way 1/26; TELEHEALTH; the patient was  seen today in accompaniment of her husband and our RN one of our nurses. She still has the 3 open areas 1 on the right medial mid tibia which is the remanent of a more extensive wound in this area, one on the right lateral ankle and a large eschared pressure area on the right heel. We have been using Iodoflex to the 2 original wounds and a bed at 9 application to the eschared area on the heel 2/15; the first time we have seen this patient and then several months out of concern for the pandemic. She had a large horizontal wound in the mid tibia. Only the medial aspect of this is still open. Area on the lateral ankle is just about closed. She had a new pressure ulcer on the right heel which I have removed some of the eschar. We have been using Iodoflex which I will continue. The area in the mid tibia has a round circle in the middle of exposed muscle. I think we would have to use an advanced treatment product to stimulate granulation over this area. We will run this through her insurance. She is not eligible for plastic surgery for many reasons 3/1; TELEHEALTH. The patient was seen today by telehealth. 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 We have been using Iodoflex to the wound areas which include the right anterior mid tibia, right lateral ankle and the right heel. All of these were reviewed 3/8; the patient's wound just above the right ankle is closed. She still has a contracting black eschar on the heel where she had a pressure ulcer. The medial part of her original wound on the mid tibia has exposed muscle. We have made really made no progress in this area although we have managed to get a lot of the  original wound in this area to close. We used Apligraf #1 today 3/22; the patient's ankle wound remains closed. She still has a contracting black eschar on the heel although it seems to have less surface area. It still not clear whether there is any depth here. We used Apligraf #2 today in an attempt to get granulation over the exposed muscle and what is left of her mid tibia area. 4/5; everything is closed except the medial aspect of the mid tibia wound, as well as the pressure injury on the right heel. She has been using Betadine to the right heel which has been gradually contracting. We used Apligraf #3 today. 4/19; Apligraf #4. Still a pressure area on the right heel she has been using Betadine superficial excoriated areas she has been applying salicylic acid to on the anterior leg and the thick horn on the leg just above her wound area 5/3; Apligraf #5. Unfortunately she came in today with a reopening of the area on the right lateral lower leg. Not much change in the wound we have been treating in the mid calf. Quite a bit of edema surrounding this wound. She reports that she was up on her feet quite a bit. The area on the right heel is separating she has been using Betadine She comes in with a new wound on the left lateral malleolus which is very disappointing this is been open since last week 5/17; we applied her last Apligraf last time. Unfortunately that did not have any effect on the deep area medially in the mid tibial area. However the area over the right lateral  malleolus is a lot better. Right heel also is contracted we used Iodoflex last time. The new wound on the left lateral malleolus were using Santyl to. This required debridement. 6/1. Not much change in the area on the right medial mid tibia. Right lateral ankle is improved she has the necrotic area on the right heel which was a pressure area. New area on the left lateral malleolus from last time. I changed her to Iodoflex to the  area on the left lateral malleolus she said this hurt I have put her back on Santyl 6/15; right mid tibia is unchanged. I do not think there is anything I can do to this topically that we will get this to granulate over the muscle. And I am furthermore I am not sure that she is a surgical candidate either because of her severe pulmonary issues or because she really does not want to go through it. ooThe area on the right lateral malleolus is a small punched out area. ooThe area on the left lateral malleolus again small punched out with nonviable surface ooPressure ulcer on the right heel at separating black eschar. I went ahead and remove this today Lura Em has an area on the left anterior mid tibia just laterally. Geographic wounds debris on the surface. Some erythema here. 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 There is skin changes widely Change the primary dressing to Sorbact to all wound areas. She needs compression on the left leg as well as the right 6/21; right mid tibia which was her one remaining wound at 1 point is unchanged ooThe area on the right lateral malleolus actually is close to closing over still a small punched out area ooThe area on the left lateral malleolus again a deep wound it is this 1 that she feels pain in. ooPressure ulcer on the right heel was cultured today. ooNew area on the left anterior mid tibia several geographic wounds last week in the setting of chronic venous insufficiency this actually looks some better 7/6; ooRight mid tibia about the same. Exposed muscle ooLeft lateral malleolus again necrotic debris over the surface. ooPressure ulcer on the right heel. She finished the Keflex we prescribed. Necrotic debris debrided with a #3 curette ooThe rest of her wounds on the right mid tibia right lateral ankle are better Her husband reminds me that she had a right femoral endarterectomy and has 2 stents in her left thigh placed  in 2011 approximately by vascular surgery in [place]. She saw Dr. [name] last in February 2020. At that point he did not think that the arterial insufficiency she had on the right was sufficient to explain any of her right lower extremity wounds however she now has an area on the left lateral malleolus that looks like an ischemic wound 7/12; we have been using Sorbact all wound areas ooRight mid tibia about the same exposed muscle ooLeft lateral malleolus small wound with debris in the surface punched out ooPressure ulcer of the right heel requiring debridement of necrotic debris ooLateral ankle wound on the right is just about closed ooRight mid tibia wound about the same still with exposed muscle. 7/26 really no change in any wounds. We have been using Sorbact. She has bilateral lower extremity wounds. This is in the setting of scleroderma, severe venous hypertension. She also has known PAD and I had really hoped to be able to get a review by Dr. [name] but apparently that is not booked until August 31. I have  asked her to call back to vein and vascular and get an appointment with somebody a little earlier if that is possible. 8/16; patient's area on the right ankle which was a chronic wound is closed over again. The area on the right mid lower leg, right heel left lateral ankle are all about the same. Pressure ulcer on the right heel is still not viable. New areas identified on the right lateral calf. She has a follow-up with Dr. [name] on 8/31. Phenomenally I would like him to go over her arterial status with regards to her underlying stents. 9/13; patient had her ARTERIAL studies done however apparently Dr. [name] is now in [place] so she did not get an appointment with him on 8/31 and was not referred to any other provider. On the right side her PTA was monophasic. Noncompressible. TBI on the right at 0.41 on the left she was monophasic and noncompressible they did not do a TBI because  she had a Band-Aid on her big toe. Patient does not describe claudication although she is having a lot of pain in the left lateral malleolus. 10/11; the patient had a right-sided interventions on 10/5. She had a angioplasty of the right anterior tibial artery as well as the tibioperoneal trunk/peroneal artery she had a drug-coated balloon angioplasty of the right superficial femoral artery. On the left she did not have any interventions yet but is going to have another look at this in 2 weeks. The superficial femoral artery on the left and the associated stent are patent popliteal artery is patent to the dominant vessel runoff is the anterior tibial which has several high-grade stenosis in the proximal one third. We have been using Sorbact. She also has a new area on the right upper mid tibia. 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 She tolerated this well. She is using Iodoflex and her husband is doing a kerlix Coban on her. She arrives in clinic today with the original medial part of her mid tibia wound with muscle exposure small area on the right ankle which was part of her original wound she has 2 punched-out areas laterally and 1 above her area anteriorly. Nonviable surfaces similarly the right heel is nonviable. On the left side she has an area on the left foot and the left lateral ankle similar nonviable surfaces. 11/8; patient arrives in clinic today with nothing improved. She has a multiple lightly group of wounds on the right lower leg presumably different etiologies including a skin biopsy, the original wound she had medially and some punched-out areas that are not of clear defined etiology. She says she was in a lot of pain this week  could not have her leg up on the bed did not get completely relief from dropping it over the bed. She also has a small area on the left lateral malleolus and the left lateral foot necrotic surfaces on all of these wounds. We have been using Iodoflex I attempted to put her in 3 layer compression last week that did not go well. We will be backing off on kerlix Coban this week. 11/15; the ultrasound that I sent the patient for last week showed findings consistent with acute deep vein thrombosis involving the right distal femoral vein and right popliteal  vein. I was concerned about the combination of Plavix and Xarelto however apparently Dr. [name] felt that was not a problem. She is now going through the acute dosing of Xarelto. She has an appointment with Dr. Trula Slade on 12/5. 11/29; 10 days ago before he left for weeks vacation she called to report pain in the right leg I sent her in some doxycycline her husband thought things were getting better however she required admission to hospital from 11/22 through 11/24 prompted by finding that her hemoglobin was 5.7 she was transfused 2 units of packed cells and her hemoglobin stabilized. She did undergo an endoscopy with findings of significant esophagitis part of which was candidal and part of it reflux. She was discharged on nystatin swish and swallow which she is doing. She seems to be better from that regard. There was no active other active source of bleeding Unfortunately the combination of Xarelto and Plavix was felt to be too much for her. The Xarelto was stopped and an IVC filter was placed she is continued on Plavix. Her big complaint today is pain in the right calf really from the mid aspect superiorly. This is very tender slightly red but not particularly warm. She did see vein and vascular in the hospital I will see if I can pull their note 12/6; patient was at Dr. Stephens Shire office today. Great toe pressure on the right is 54 on the left 49  she has a 50 to 74% stenosis of the right mid popliteal on the left at 30 to 49% proximal SFA and a 50 to 74% CFA. She was treated with angioplasty of the right ATA, right TP trunk and peroneal artery as well as an angioplasty of the right SFA to popliteal artery. On 10/26 she underwent angioplasty of the left ATA and left TP trunk she has a history of a left common femoral endarterectomy and left superficial femoral artery stent as well as a right superficial femoral artery stent. In addition to her arterial disease she has significant venous disease with a history of a right greater saphenous vein ablation and a recent DVT in the right femoral and popliteal vein with insertion of an IVC filter. She has remained on Plavix but her anticoagulant was stopped. She continues to have a lot of swelling and pain in the right leg with significant erythema which I am assuming is stasis dermatitis. She has had substantial wound areas on the right leg including right lateral a large necrotic wound with a smaller biopsy site above this. She has original wound on the left mid tibia with a small area above this as well. On the left she has a small area over the left lateral malleolus and the left lateral foot 12/22-week follow-up. Her right leg looks really terrible. She has another necrotic wound just above her original one on the right posterior leg. Greenish drainage suggestive of gram-negative/Pseudomonas infection. The entire mid part of her leg is erythematous I think most of this is stasis dermatitis rather than cellulitis. She has also a bothersome ischemic-looking right fourth toe which I noticed today without her really complaining. She has not been systemically unwell although she looks more physically frail 12/27; I reached out to vein and vascular last weekend Dr. [name] will be seeing her on Thursday. She has a gangrenous area on the tip of the right fourth toe. Multiple areas breaking down. I think  this is a combination of arterial and severe venous insufficiency she has multiple wounds in  the right leg but not a lot of pain. She has 3 small areas on the left leg we have been using Santyl. On the right leg we have been using silver alginate or at least that is what they have been using Culture I did last week showed abundant is Pseudomonas aeruginosa had a few Enterococcus faecalis. I have been using Ceftin here predominantly because of the green drainage suggesting Pseudomonas I have not yet addressed the Enterococcus which was ampicillin sensitive Objective Constitutional Patient is hypertensive.. Pulse regular and within target range for patient.Marland Kitchen Respirations regular, non-labored and within target range.. Temperature is normal and within the target range for the patient.Marland Kitchen Appears in no distress. Vitals Time Taken: 2:16 PM, Height: 68 in, Weight: 132 lbs, BMI: 20.1, Temperature: 98.5 F, Pulse: 96 bpm, Respiratory Rate: 17 breaths/min, Blood Pressure: 170/72 mmHg. Cardiovascular Pulses are nonpalpable on the right. General Notes: Wound exam ooSizable necrotic wounds on the right posterior lateral leg. Punched out necrotic wounds on the right anterior right lateral multitude of superficial areas which are in the setting I believe of chronic venous insufficiency. Also has the original wound in right mid tibia. ooDry gangrene on the tip of the right fourth toe oo3 small areas on the left again necrotic surfaces but not nearly as significant as on the right Integumentary (Hair, Skin) Wound #16 status is Open. Original cause of wound was Pressure Injury. The wound is located on the Right Calcaneus. The wound measures 0.5cm length x 1cm width x 0.4cm depth; 0.393cm^2 area and 0.157cm^3 volume. There is Fat Layer (Subcutaneous Tissue) exposed. There is no tunneling or undermining noted. There is a small amount of serous drainage noted. The wound margin is distinct with the outline attached  to the wound base. There is no granulation within the wound bed. There is a large (67-100%) amount of necrotic tissue within the wound bed including Adherent Slough. Wound #19 status is Open. Original cause of wound was Gradually Appeared. The wound is located on the Right,Lateral Malleolus. The wound measures 0.5cm length x 0.2cm width x 0.2cm depth; 0.079cm^2 area and 0.016cm^3 volume. There is Fat Layer (Subcutaneous Tissue) exposed. There is no tunneling or undermining noted. There is a small amount of purulent drainage noted. The wound margin is distinct with the outline attached to the wound base. There is medium (34-66%) pink granulation within the wound bed. There is a medium (34-66%) amount of necrotic tissue within the wound bed including Adherent Slough. Wound #20 status is Open. Original cause of wound was Gradually Appeared. The wound is located on the Left,Lateral Malleolus. The wound measures 0.9cm length x 0.7cm width x 0.1cm depth; 0.495cm^2 area and 0.049cm^3 volume. There is Fat Layer (Subcutaneous Tissue) exposed. There is no tunneling or undermining noted. There is a small amount of serous drainage noted. The wound margin is flat and intact. There is no granulation within the wound bed. There is a large (67-100%) amount of necrotic tissue within the wound bed including Adherent Slough. Wound #23 status is Open. Original cause of wound was Gradually Appeared. The wound is located on the Right,Posterior Lower Leg. The wound measures 8.5cm length x 4.5cm width x 0.3cm depth; 30.041cm^2 area and 9.012cm^3 volume. There is Fat Layer (Subcutaneous Tissue) exposed. There is no tunneling or undermining noted. There is a large amount of purulent drainage noted. Foul odor after cleansing was noted. The wound margin is distinct with the outline attached to the wound base. There is no granulation within the  wound bed. There is a large (67-100%) amount of necrotic tissue within the wound bed  including Eschar and Adherent Slough. Wound #24 status is Open. Original cause of wound was Gradually Appeared. The wound is located on the Right,Anterior Lower Leg. The wound measures 1.6cm length x 1cm width x 0.2cm depth; 1.257cm^2 area and 0.251cm^3 volume. There is Fat Layer (Subcutaneous Tissue) exposed. There is no tunneling or undermining noted. There is a medium amount of serous drainage noted. The wound margin is flat and intact. There is no granulation within the wound bed. There is a large (67-100%) amount of necrotic tissue within the wound bed including Adherent Slough. Wound #25 status is Open. Original cause of wound was Gradually Appeared. The wound is located on the Left,Lateral Foot. The wound measures 1cm length x 0.8cm width x 0.1cm depth; 0.628cm^2 area and 0.063cm^3 volume. There is Fat Layer (Subcutaneous Tissue) exposed. There is no tunneling or undermining noted. There is a small amount of serous drainage noted. The wound margin is flat and intact. There is no granulation within the wound bed. There is a large (67- 100%) amount of necrotic tissue within the wound bed including Adherent Slough. Wound #26 status is Open. Original cause of wound was Gradually Appeared. The wound is located on the Right,Lateral Lower Leg. The wound measures 12cm length x 5cm width x 0.1cm depth; 47.124cm^2 area and 4.712cm^3 volume. There is Fat Layer (Subcutaneous Tissue) exposed. There is no tunneling or undermining noted. There is a large amount of purulent drainage noted. Foul odor after cleansing was noted. The wound margin is flat and intact. There is no granulation within the wound bed. There is a large (67-100%) amount of necrotic tissue within the wound bed including Eschar. Wound #27 status is Open. Original cause of wound was Gradually Appeared. The wound is located on the Right,Distal,Anterior Lower Leg. The wound measures 2cm length x 0.6cm width x 0.1cm depth; 0.942cm^2 area and  0.094cm^3 volume. There is Fat Layer (Subcutaneous Tissue) exposed. There is no tunneling or undermining noted. There is a medium amount of serous drainage noted. The wound margin is flat and intact. There is no granulation within the wound bed. There is a large (67-100%) amount of necrotic tissue within the wound bed including Adherent Slough. Wound #28 status is Open. Original cause of wound was Trauma. The wound is located on the Left,Lateral Lower Leg. The wound measures 0.6cm length x 0.3cm width x 0.1cm depth; 0.141cm^2 area and 0.014cm^3 volume. There is Fat Layer (Subcutaneous Tissue) exposed. There is no tunneling or undermining noted. There is a small amount of serous drainage noted. The wound margin is flat and intact. There is large (67-100%) pink granulation within the wound bed. There is a small (1-33%) amount of necrotic tissue within the wound bed including Adherent Slough. Wound #29 status is Open. Original cause of wound was Gradually Appeared. The wound is located on the Right,Proximal,Lateral Lower Leg. The wound measures 1cm length x 1cm width x 0.1cm depth; 0.785cm^2 area and 0.079cm^3 volume. There is Fat Layer (Subcutaneous Tissue) exposed. There is no tunneling or undermining noted. There is a medium amount of purulent drainage noted. The wound margin is flat and intact. There is no granulation within the wound bed. There is a large (67-100%) amount of necrotic tissue within the wound bed including Adherent Slough. Wound #30 status is Open. Original cause of wound was Gradually Appeared. The wound is located on the Right T Fourth. The wound measures 2cm length  x oe 2.8cm width x 0.1cm depth; 4.398cm^2 area and 0.44cm^3 volume. There is Fat Layer (Subcutaneous Tissue) exposed. There is no tunneling or undermining noted. There is a medium amount of purulent drainage noted. The wound margin is flat and intact. There is no granulation within the wound bed. There is a  large (67-100%) amount of necrotic tissue within the wound bed including Eschar and Adherent Slough. Wound #5 status is Open. Original cause of wound was Gradually Appeared. The wound is located on the Right,Medial Lower Leg. The wound measures 2.1cm length x 2.5cm width x 0.1cm depth; 4.123cm^2 area and 0.412cm^3 volume. There is Fat Layer (Subcutaneous Tissue) exposed. There is no tunneling or undermining noted. There is a medium amount of serous drainage noted. The wound margin is flat and intact. There is small (1-33%) pink granulation within the wound bed. There is a large (67-100%) amount of necrotic tissue within the wound bed including Adherent Slough. 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 Bathing/ Shower/ Hygiene: May shower and wash wound with soap and water. - on days that dressing is changed Edema Control - Lymphedema / SCD / Other: Elevate legs to the level of the heart or above for 30 minutes daily and/or when sitting, a frequency of: - throughout the day Avoid standing for long periods of time. Exercise regularly The following medication(s) was prescribed: cefdinir oral 300 mg capsule 1 capsule oral bid for afurther 7  days for wound infectrion starting 12/26/2020 WOUND #16: - Calcaneus Wound Laterality: Right Cleanser: Soap and Water 2 x Per Week/7 Days Discharge Instructions: May shower and wash wound with dial antibacterial soap and water prior to dressing change. Peri-Wound Care: Sween Lotion (Moisturizing lotion) 2 x Per Week/7 Days Discharge Instructions: Apply moisturizing lotion as directed Prim Dressing: IODOFLEX 0.9% Cadexomer Iodine Pad 4x6 cm 2 x Per Week/7 Days ary Discharge Instructions: Apply Iodoflex or Iodosorb to wound bed as instructed Secondary Dressing: Woven Gauze Sponge, Non-Sterile 4x4 in 2 x Per Week/7 Days Discharge Instructions: Apply over primary dressing as directed. Secondary Dressing: ABD Pad, 8x10 2 x Per Week/7 Days Discharge Instructions: Apply over primary dressing as directed. Com pression Wrap: Kerlix Roll 4.5x3.1 (in/yd) 2 x Per Week/7 Days Discharge Instructions: Apply Kerlix and Coban compression as directed. Com pression Wrap: Coban Self-Adherent Wrap 4x5 (in/yd) 2 x Per Week/7 Days Discharge Instructions: Apply over Kerlix as directed. WOUND #19: - Malleolus Wound Laterality: Right, Lateral Cleanser: Soap and Water 2 x Per Week/7 Days Discharge Instructions: May shower and wash wound with dial antibacterial soap and water prior to dressing change. Peri-Wound Care: Sween Lotion (Moisturizing lotion) 2 x Per Week/7 Days Discharge Instructions: Apply moisturizing lotion as directed Prim Dressing: IODOFLEX 0.9% Cadexomer Iodine Pad 4x6 cm 2 x Per Week/7 Days ary Discharge Instructions: Apply Iodoflex or Iodosorb to wound  bed as instructed Secondary Dressing: Woven Gauze Sponge, Non-Sterile 4x4 in 2 x Per Week/7 Days Discharge Instructions: Apply over primary dressing as directed. Secondary Dressing: ABD Pad, 8x10 2 x Per Week/7 Days Discharge Instructions: Apply over primary dressing as directed. Com pression Wrap: Kerlix Roll 4.5x3.1 (in/yd) 2 x Per Week/7  Days Discharge Instructions: Apply Kerlix and Coban compression as directed. Com pression Wrap: Coban Self-Adherent Wrap 4x5 (in/yd) 2 x Per Week/7 Days Discharge Instructions: Apply over Kerlix as directed. WOUND #20: - Malleolus Wound Laterality: Left, Lateral Cleanser: Soap and Water Every Other Day/15 Days Discharge Instructions: May shower and wash wound with dial antibacterial soap and water prior to dressing change. Peri-Wound Care: Sween Lotion (Moisturizing lotion) Every Other Day/15 Days Discharge Instructions: Apply moisturizing lotion as directed Prim Dressing: Santyl Ointment Every Other Day/15 Days ary Discharge Instructions: Apply to wound bed as instructed Secondary Dressing: Woven Gauze Sponge, Non-Sterile 4x4 in Every Other Day/15 Days Discharge Instructions: Apply over primary dressing as directed. Secondary Dressing: ABD Pad, 8x10 Every Other Day/15 Days Discharge Instructions: Apply over primary dressing as directed. Com pression Wrap: Kerlix Roll 4.5x3.1 (in/yd) (Generic) Every Other Day/15 Days Discharge Instructions: Apply Kerlix and Coban compression as directed. Com pression Wrap: Coban Self-Adherent Wrap 4x5 (in/yd) (Generic) Every Other Day/15 Days Discharge Instructions: Apply over Kerlix as directed. WOUND #23: - Lower Leg Wound Laterality: Right, Posterior Cleanser: Soap and Water 2 x Per Week/7 Days Discharge Instructions: May shower and wash wound with dial antibacterial soap and water prior to dressing change. Peri-Wound Care: Sween Lotion (Moisturizing lotion) 2 x Per Week/7 Days Discharge Instructions: Apply moisturizing lotion as directed Topical: Gentamicin 2 x Per Week/7 Days Discharge Instructions: Under Iodoflex Prim Dressing: IODOFLEX 0.9% Cadexomer Iodine Pad 4x6 cm 2 x Per Week/7 Days ary Discharge Instructions: Apply Iodoflex or Iodosorb to wound bed as instructed Secondary Dressing: Woven Gauze Sponge, Non-Sterile 4x4 in 2 x Per Week/7  Days Discharge Instructions: Apply over primary dressing as directed. Secondary Dressing: ABD Pad, 8x10 2 x Per Week/7 Days Discharge Instructions: Apply over primary dressing as directed. Com pression Wrap: Kerlix Roll 4.5x3.1 (in/yd) 2 x Per Week/7 Days Discharge Instructions: Apply Kerlix and Coban compression as directed. Com pression Wrap: Coban Self-Adherent Wrap 4x5 (in/yd) 2 x Per Week/7 Days Discharge Instructions: Apply over Kerlix as directed. WOUND #24: - Lower Leg Wound Laterality: Right, Anterior Cleanser: Soap and Water 2 x Per Week/7 Days Discharge Instructions: May shower and wash wound with dial antibacterial soap and water prior to dressing change. Peri-Wound Care: Sween Lotion (Moisturizing lotion) 2 x Per Week/7 Days Discharge Instructions: Apply moisturizing lotion as directed Prim Dressing: IODOFLEX 0.9% Cadexomer Iodine Pad 4x6 cm 2 x Per Week/7 Days ary Discharge Instructions: Apply Iodoflex or Iodosorb to wound bed as instructed Secondary Dressing: Woven Gauze Sponge, Non-Sterile 4x4 in 2 x Per Week/7 Days Discharge Instructions: Apply over primary dressing as directed. Secondary Dressing: ABD Pad, 8x10 2 x Per Week/7 Days Discharge Instructions: Apply over primary dressing as directed. Com pression Wrap: Kerlix Roll 4.5x3.1 (in/yd) 2 x Per Week/7 Days Discharge Instructions: Apply Kerlix and Coban compression as directed. Com pression Wrap: Coban Self-Adherent Wrap 4x5 (in/yd) 2 x Per Week/7 Days Discharge Instructions: Apply over Kerlix as directed. WOUND #25: - Foot Wound Laterality: Left, Lateral Cleanser: Soap and Water Every Other Day/15 Days Discharge Instructions: May shower and wash wound with dial antibacterial soap and water prior to dressing change. Peri-Wound Care: Sween Lotion (Moisturizing lotion) Every Other  Day/15 Days Discharge Instructions: Apply moisturizing lotion as directed Prim Dressing: Santyl Ointment Every Other Day/15  Days ary Discharge Instructions: Apply to wound bed as instructed Secondary Dressing: Woven Gauze Sponge, Non-Sterile 4x4 in Every Other Day/15 Days Discharge Instructions: Apply over primary dressing as directed. Secondary Dressing: ABD Pad, 8x10 Every Other Day/15 Days Discharge Instructions: Apply over primary dressing as directed. Com pression Wrap: Kerlix Roll 4.5x3.1 (in/yd) Every Other Day/15 Days Discharge Instructions: Apply Kerlix and Coban compression as directed. Com pression Wrap: Coban Self-Adherent Wrap 4x5 (in/yd) Every Other Day/15 Days Discharge Instructions: Apply over Kerlix as directed. WOUND #26: - Lower Leg Wound Laterality: Right, Lateral Cleanser: Soap and Water 2 x Per Week/7 Days Discharge Instructions: May shower and wash wound with dial antibacterial soap and water prior to dressing change. Peri-Wound Care: Sween Lotion (Moisturizing lotion) 2 x Per Week/7 Days Discharge Instructions: Apply moisturizing lotion as directed Prim Dressing: IODOFLEX 0.9% Cadexomer Iodine Pad 4x6 cm 2 x Per Week/7 Days ary Discharge Instructions: Apply Iodoflex or Iodosorb to wound bed as instructed Secondary Dressing: Woven Gauze Sponge, Non-Sterile 4x4 in 2 x Per Week/7 Days Discharge Instructions: Apply over primary dressing as directed. Secondary Dressing: ABD Pad, 8x10 2 x Per Week/7 Days Discharge Instructions: Apply over primary dressing as directed. Com pression Wrap: Kerlix Roll 4.5x3.1 (in/yd) 2 x Per Week/7 Days Discharge Instructions: Apply Kerlix and Coban compression as directed. Com pression Wrap: Coban Self-Adherent Wrap 4x5 (in/yd) 2 x Per Week/7 Days Discharge Instructions: Apply over Kerlix as directed. WOUND #27: - Lower Leg Wound Laterality: Right, Anterior, Distal Cleanser: Soap and Water 2 x Per Week/7 Days Discharge Instructions: May shower and wash wound with dial antibacterial soap and water prior to dressing change. Peri-Wound Care: Sween Lotion  (Moisturizing lotion) 2 x Per Week/7 Days Discharge Instructions: Apply moisturizing lotion as directed Prim Dressing: IODOFLEX 0.9% Cadexomer Iodine Pad 4x6 cm 2 x Per Week/7 Days ary Discharge Instructions: Apply Iodoflex or Iodosorb to wound bed as instructed Secondary Dressing: Woven Gauze Sponge, Non-Sterile 4x4 in 2 x Per Week/7 Days Discharge Instructions: Apply over primary dressing as directed. Secondary Dressing: ABD Pad, 8x10 2 x Per Week/7 Days Discharge Instructions: Apply over primary dressing as directed. Com pression Wrap: Kerlix Roll 4.5x3.1 (in/yd) 2 x Per Week/7 Days Discharge Instructions: Apply Kerlix and Coban compression as directed. Com pression Wrap: Coban Self-Adherent Wrap 4x5 (in/yd) 2 x Per Week/7 Days Discharge Instructions: Apply over Kerlix as directed. WOUND #29: - Lower Leg Wound Laterality: Right, Lateral, Proximal Cleanser: Soap and Water 2 x Per Week/7 Days Discharge Instructions: May shower and wash wound with dial antibacterial soap and water prior to dressing change. Peri-Wound Care: Sween Lotion (Moisturizing lotion) 2 x Per Week/7 Days Discharge Instructions: Apply moisturizing lotion as directed Prim Dressing: IODOFLEX 0.9% Cadexomer Iodine Pad 4x6 cm 2 x Per Week/7 Days ary Discharge Instructions: Apply Iodoflex or Iodosorb to wound bed as instructed Secondary Dressing: Woven Gauze Sponge, Non-Sterile 4x4 in 2 x Per Week/7 Days Discharge Instructions: Apply over primary dressing as directed. Secondary Dressing: ABD Pad, 8x10 2 x Per Week/7 Days Discharge Instructions: Apply over primary dressing as directed. Com pression Wrap: Kerlix Roll 4.5x3.1 (in/yd) 2 x Per Week/7 Days Discharge Instructions: Apply Kerlix and Coban compression as directed. Com pression Wrap: Coban Self-Adherent Wrap 4x5 (in/yd) 2 x Per Week/7 Days Discharge Instructions: Apply over Kerlix as directed. WOUND #5: - Lower Leg Wound Laterality: Right, Medial Cleanser: Soap  and Water 2 x Per  Week/7 Days Discharge Instructions: May shower and wash wound with dial antibacterial soap and water prior to dressing change. Peri-Wound Care: Sween Lotion (Moisturizing lotion) 2 x Per Week/7 Days Discharge Instructions: Apply moisturizing lotion as directed Prim Dressing: IODOFLEX 0.9% Cadexomer Iodine Pad 4x6 cm 2 x Per Week/7 Days ary Discharge Instructions: Apply Iodoflex or Iodosorb to wound bed as instructed Secondary Dressing: Woven Gauze Sponge, Non-Sterile 4x4 in 2 x Per Week/7 Days Discharge Instructions: Apply over primary dressing as directed. Secondary Dressing: ABD Pad, 8x10 2 x Per Week/7 Days Discharge Instructions: Apply over primary dressing as directed. Com pression Wrap: Kerlix Roll 4.5x3.1 (in/yd) 2 x Per Week/7 Days Discharge Instructions: Apply Kerlix and Coban compression as directed. Com pression Wrap: Coban Self-Adherent Wrap 4x5 (in/yd) 2 x Per Week/7 Days Discharge Instructions: Apply over Kerlix as directed. 1. I continued with the Iodoflex on the right leg wounds and the Santyl on the left 2. Silver alginate to the right fourth toe or Betadine 3. vein and vascular is seeing her on Thursday at my request. I think she clearly has some degree of arterial insufficiency even given her recent revascularization. She also has severe chronic venous insufficiency. It is also possible that her recent DVT is never resolved given the fact she is not on anticoagulation. She does however have a IVC filter 4. I am not going to do any further mechanical debridement until I see here from vein and vascular 5. I have extended the Cefdinir 300 twice daily for further 7 days total of 14 Electronic Signature(s) Signed: 12/27/2020 2:27:07 PM By: Linton Ham MD Entered By: Linton Ham on 12/26/2020 16:43:38 -------------------------------------------------------------------------------- SuperBill Details Patient Name: Date of Service: BLA [name] 12/26/2020 Medical Record Number: [id number] Patient Account Number: [id number] Date of Birth/Sex: Treating RN: [birth year] (69 y.o. 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 I87.331 Chronic venous hypertension (idiopathic) with ulcer and inflammation of right lower extremity L89.616 Pressure-induced deep tissue damage of right heel L97.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 L97.528 Non-pressure chronic ulcer of other part of left foot with other specified severity I70.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 I70.248 Atherosclerosis of native arteries of left leg with ulceration of other part of lower leg Physician Procedures : CPT4 Code Description Modifier [id number] - WC PHYS LEVEL 4 - EST PT ICD-10 Diagnosis Description L97.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 I87.331 Chronic venous hypertension (idiopathic) with ulcer and inflammation  of right lower extremity L97.811 Non-pressure chronic ulcer of other part of right lower leg limited to breakdown of skin I70.248 Atherosclerosis of native arteries of left leg with ulceration of other part of lower leg Quantity: 1 Electronic Signature(s) Signed: 12/27/2020 2:27:07 PM By: Linton Ham MD Entered By: Linton Ham on 12/26/2020 16:44:04

## 2020-12-28 NOTE — Progress Notes (Signed)
[name] [name], [name] [name] ([id number]) Visit Report for 12/22/2020 SuperBill Details Patient Name: Date of Service: [name] 12/22/2020 Medical Record Number: [id number] Patient Account Number: [id number] Date of Birth/Sex: Treating RN: [birth year] (69 y.o. 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 I87.331 Chronic venous hypertension (idiopathic) with ulcer and inflammation of right lower extremity L89.616 Pressure-induced deep tissue damage of right heel L97.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 L97.528 Non-pressure chronic ulcer of other part of left foot with other specified severity I70.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

## 2020-12-28 NOTE — Progress Notes (Signed)
[name] [name], [name] [name] ([id number]) Visit Report for 12/22/2020 Arrival Information Details Patient Name: Date of Service: [name] [name] 12/22/2020 11:15 A M Medical Record Number: [id number] Patient Account Number: [id number] Date of Birth/Sex: Treating RN: 1951-03-25 (69 y.o. [name], [name] Primary Care Jahlia Omura: [name] Other Clinician: Referring Endora Teresi: Treating Narek Kniss/Extender: [name] in Treatment: 64 Visit Information History Since Last Visit Added or deleted any medications: No Patient Arrived: Wheel Chair Any new allergies or adverse reactions: No Arrival Time: 11:36 Had a fall or experienced change in No Accompanied By: husban activities of daily living that may affect Transfer Assistance: None risk of falls: Patient Identification Verified: Yes Signs or symptoms of abuse/neglect since last visito No Secondary Verification Process Completed: Yes Hospitalized since last visit: No Patient Requires Transmission-Based Precautions: No Implantable device outside of the clinic excluding No Patient Has Alerts: Yes cellular tissue based products placed in the center Patient Alerts: R ABI: Byron TBI: 0.41 since last visit: L ABI: Nolic (07/2020) Has Dressing in Place as Prescribed: Yes Pain Present Now: Yes Electronic Signature(s) Signed: 12/28/2020 2:09:35 PM By: Rhae Hammock RN Entered By: Rhae Hammock on 12/22/2020 11:37:39 -------------------------------------------------------------------------------- Clinic Level of Care Assessment Details Patient Name: Date of Service: [name] [name] 12/22/2020 11:15 A M Medical Record Number: [id number] Patient Account Number: [id number] Date of Birth/Sex: Treating RN: 20-Jul-1951 (69 y.o. [name], [name] Primary Care Lexia Vandevender: [name] Other Clinician: Referring Sabrie Moritz: Treating Hue Frick/Extender: [name] in Treatment: [facility] Level of  Care Assessment Items TOOL 4 Quantity Score X- 1 0 Use when only an EandM is performed on FOLLOW-UP visit ASSESSMENTS - Nursing Assessment / Reassessment X- 1 10 Reassessment of Co-morbidities (includes updates in patient status) X- 1 5 Reassessment of Adherence to Treatment Plan ASSESSMENTS - Wound and Skin A ssessment / Reassessment X - Simple Wound Assessment / Reassessment - one wound 1 5 _0  - 0 Complex Wound Assessment / Reassessment - multiple wounds X- 1 10 Dermatologic / Skin Assessment (not related to wound area) ASSESSMENTS - Focused Assessment _1  - 0 Circumferential Edema Measurements - multi extremities X- 1 10 Nutritional Assessment / Counseling / Intervention X- 1 5 Lower Extremity Assessment (monofilament, tuning fork, pulses) _2  - 0 Peripheral Arterial Disease Assessment (using hand held doppler) ASSESSMENTS - Ostomy and/or Continence Assessment and Care _3  - 0 Incontinence Assessment and Management _4  - 0 Ostomy Care Assessment and Management (repouching, etc.) PROCESS - Coordination of Care _5  - 0 Simple Patient / Family Education for ongoing care X- 1 20 Complex (extensive) Patient / Family Education for ongoing care X- 1 10 Staff obtains Programmer, systems, Records, T Results / Process Orders est _6  - 0 Staff telephones HHA, Nursing Homes / Clarify orders / etc _7  - 0 Routine Transfer to another Facility (non-emergent condition) _8  - 0 Routine Hospital Admission (non-emergent condition) _9  - 0 New Admissions / Biomedical engineer / Ordering NPWT Apligraf, etc. , _10  - 0 Emergency Hospital Admission (emergent condition) X- 1 10 Simple Discharge Coordination _11  - 0 Complex (extensive) Discharge Coordination PROCESS - Special Needs _12  - 0 Pediatric / Minor Patient Management _13  - 0 Isolation Patient Management _14  - 0 Hearing / Language / Visual special needs _15  - 0 Assessment of Community assistance (transportation, D/C planning, etc.) _16  -  0 Additional assistance / Altered mentation _17  - 0 Support Surface(s) Assessment (bed, cushion, seat, etc.) INTERVENTIONS - Wound Cleansing / Measurement _18  -  0 Simple Wound Cleansing - one wound X- 11 5 Complex Wound Cleansing - multiple wounds _0  - 0 Wound Imaging (photographs - any number of wounds) _1  - 0 Wound Tracing (instead of photographs) _2  - 0 Simple Wound Measurement - one wound X- 11 5 Complex Wound Measurement - multiple wounds INTERVENTIONS - Wound Dressings _3  - 0 Small Wound Dressing one or multiple wounds _4  - 0 Medium Wound Dressing one or multiple wounds X- 11 20 Large Wound Dressing one or multiple wounds <TKWIOXBDZHGDJMEQ>_6<\/STMHDQQIWLNLGXQJ>_1  - 0 Application of Medications - topical <HERDEYCXKGYJEHUD>_1<\/SHFWYOVZCHYIFOYD>_7  - 0 Application of Medications - injection INTERVENTIONS - Miscellaneous _7  - 0 External ear exam _8  - 0 Specimen Collection (cultures, biopsies, blood, body fluids, etc.) _9  - 0 Specimen(s) / Culture(s) sent or taken to Lab for analysis _10  - 0 Patient Transfer (multiple staff / Civil Service fast streamer / Similar devices) _11  - 0 Simple Staple / Suture removal (25 or less) _12  - 0 Complex Staple / Suture removal (26 or more) _13  - 0 Hypo / Hyperglycemic Management (close monitor of Blood Glucose) _14  - 0 Ankle / Brachial Index (ABI) - do not check if billed separately X- 1 5 Vital Signs Has the patient been seen at the hospital within the last three years: Yes Total Score: 420 Level Of Care: New/Established - Level 5 Electronic Signature(s) Signed: 12/28/2020 2:09:35 PM By: Rhae Hammock RN Entered By: Rhae Hammock on 12/22/2020 16:31:50 -------------------------------------------------------------------------------- Compression Therapy Details Patient Name: Date of Service: [name] [name] Drafts F. 12/22/2020 11:15 A M Medical Record Number: [id number] Patient Account Number: [id number] Date of Birth/Sex: Treating RN: 1951-12-14 (69 y.o. Orvan Falconer Primary Care Yaden Seith: [name] Other  Clinician: Referring Nicolena Schurman: Treating Titiana Severa/Extender: [name] in Treatment: 456 Compression Therapy Performed for Wound Assessment: Wound #16 Right Calcaneus Performed By: Clinician Carlene Coria, RN Compression Type: Double Layer Electronic Signature(s) Signed: 12/22/2020 3:12:05 PM By: Carlene Coria RN Entered By: Carlene Coria on 12/22/2020 12:10:16 -------------------------------------------------------------------------------- Compression Therapy Details Patient Name: Date of Service: [name] [name] 12/22/2020 11:15 A M Medical Record Number: [id number] Patient Account Number: [id number] Date of Birth/Sex: Treating RN: Jul 29, 1951 (69 y.o. Orvan Falconer Primary Care Eulanda Dorion: [name] Other Clinician: Referring Breindel Collier: Treating Tinsleigh Slovacek/Extender: [name] in Treatment: 456 Compression Therapy Performed for Wound Assessment: Wound #19 Right,Lateral Malleolus Performed By: Jake Church, RN Compression Type: Double Layer Electronic Signature(s) Signed: 12/22/2020 3:12:05 PM By: Carlene Coria RN Entered By: Carlene Coria on 12/22/2020 12:10:17 -------------------------------------------------------------------------------- Compression Therapy Details Patient Name: Date of Service: [name] [name] 12/22/2020 11:15 A M Medical Record Number: [id number] Patient Account Number: [id number] Date of Birth/Sex: Treating RN: 08/07/1951 (69 y.o. Orvan Falconer Primary Care Yohance Hathorne: [name] Other Clinician: Referring Mayur Duman: Treating Yalitza Teed/Extender: [name] in Treatment: 456 Compression Therapy Performed for Wound Assessment: Wound #20 Left,Lateral Malleolus Performed By: Jake Church, RN Compression Type: Double Layer Electronic Signature(s) Signed: 12/22/2020 3:12:05 PM By: Carlene Coria RN Entered By: Carlene Coria on 12/22/2020  12:10:17 -------------------------------------------------------------------------------- Compression Therapy Details Patient Name: Date of Service: [name] [name] 12/22/2020 11:15 A M Medical Record Number: [id number] Patient Account Number: [id number] Date of Birth/Sex: Treating RN: 1951-03-28 (69 y.o. Orvan Falconer Primary Care Jacey Pelc: [name] Other Clinician: Referring Manas Hickling: Treating Akaash Vandewater/Extender: [name] in Treatment: 456 Compression Therapy Performed for Wound Assessment: Wound #23 Right,Posterior Lower Leg  Performed By: Clinician Carlene Coria, RN Compression Type: Double Layer Electronic Signature(s) Signed: 12/22/2020 3:12:05 PM By: Carlene Coria RN Entered By: Carlene Coria on 12/22/2020 12:10:18 -------------------------------------------------------------------------------- Compression Therapy Details Patient Name: Date of Service: [name] [name] 12/22/2020 11:15 A M Medical Record Number: [id number] Patient Account Number: [id number] Date of Birth/Sex: Treating RN: [birth year] (70 y.o. Orvan Falconer Primary Care Elbert Polyakov: [name] Other Clinician: Referring Maebelle Sulton: Treating Aryanne Gilleland/Extender: [name] in Treatment: 456 Compression Therapy Performed for Wound Assessment: Wound #24 Right,Anterior Lower Leg Performed By: Jake Church, RN Compression Type: Double Layer Electronic Signature(s) Signed: 12/22/2020 3:12:05 PM By: Carlene Coria RN Entered By: Carlene Coria on 12/22/2020 12:10:18 -------------------------------------------------------------------------------- Compression Therapy Details Patient Name: Date of Service: [name] [name] 12/22/2020 11:15 A M Medical Record Number: [id number] Patient Account Number: [id number] Date of Birth/Sex: Treating RN: 12-27-1951 (69 y.o. Orvan Falconer Primary Care Narayan Scull: [name] Other Clinician: Referring  Lidie Glade: Treating Jillene Wehrenberg/Extender: [name] in Treatment: 456 Compression Therapy Performed for Wound Assessment: Wound #25 Left,Lateral Foot Performed By: Clinician Carlene Coria, RN Compression Type: Double Layer Electronic Signature(s) Signed: 12/22/2020 3:12:05 PM By: Carlene Coria RN Entered By: Carlene Coria on 12/22/2020 12:10:18 -------------------------------------------------------------------------------- Compression Therapy Details Patient Name: Date of Service: [name] [name] 12/22/2020 11:15 A M Medical Record Number: [id number] Patient Account Number: [id number] Date of Birth/Sex: Treating RN: [birth year] (69 y.o. Orvan Falconer Primary Care Leonor Darnell: [name] Other Clinician: Referring Kynisha Memon: Treating Abreanna Drawdy/Extender: [name] in Treatment: 456 Compression Therapy Performed for Wound Assessment: Wound #26 Right,Lateral Lower Leg Performed By: Clinician Carlene Coria, RN Compression Type: Double Layer Electronic Signature(s) Signed: 12/22/2020 3:12:05 PM By: Carlene Coria RN Entered By: Carlene Coria on 12/22/2020 12:10:19 -------------------------------------------------------------------------------- Compression Therapy Details Patient Name: Date of Service: [name] [name] 12/22/2020 11:15 A M Medical Record Number: [id number] Patient Account Number: [id number] Date of Birth/Sex: Treating RN: [birth year] (69 y.o. Orvan Falconer Primary Care Cassandra Harbold: [name] Other Clinician: Referring Maleia Weems: Treating Alekzander Cardell/Extender: [name] in Treatment: 456 Compression Therapy Performed for Wound Assessment: Wound #27 Right,Distal,Anterior Lower Leg Performed By: Jake Church, RN Compression Type: Double Layer Electronic Signature(s) Signed: 12/22/2020 3:12:05 PM By: Carlene Coria RN Entered By: Carlene Coria on 12/22/2020  12:10:19 -------------------------------------------------------------------------------- Compression Therapy Details Patient Name: Date of Service: [name] [name] 12/22/2020 11:15 A M Medical Record Number: [id number] Patient Account Number: [id number] Date of Birth/Sex: Treating RN: 03/07/1951 (69 y.o. Orvan Falconer Primary Care Delva Derden: [name] Other Clinician: Referring Jocsan Mcginley: Treating Dhyana Bastone/Extender: [name] in Treatment: 456 Compression Therapy Performed for Wound Assessment: Wound #28 Left,Lateral Lower Leg Performed By: Clinician Carlene Coria, RN Compression Type: Double Layer Electronic Signature(s) Signed: 12/22/2020 3:12:05 PM By: Carlene Coria RN Entered By: Carlene Coria on 12/22/2020 12:10:19 -------------------------------------------------------------------------------- Compression Therapy Details Patient Name: Date of Service: [name] [name] 12/22/2020 11:15 A M Medical Record Number: [id number] Patient Account Number: [id number] Date of Birth/Sex: Treating RN: 1951-11-28 (69 y.o. Orvan Falconer Primary Care Alexios Keown: [name] Other Clinician: Referring Wynonia Medero: Treating Sanah Kraska/Extender: [name] in Treatment: 456 Compression Therapy Performed for Wound Assessment: Wound #29 Right,Proximal,Lateral Lower Leg Performed By: Clinician Carlene Coria, RN Compression Type: Double Layer Electronic Signature(s) Signed: 12/22/2020 3:12:05 PM By: Carlene Coria RN Entered By: Dolores Lory  Carrie on 12/22/2020 12:10:19 -------------------------------------------------------------------------------- Compression Therapy Details Patient Name: Date of Service: [name] [name] 12/22/2020 11:15 A M Medical Record Number: [id number] Patient Account Number: [id number] Date of Birth/Sex: Treating RN: 1951/03/30 (69 y.o. Orvan Falconer Primary Care Chelsy Parrales: [name] Other  Clinician: Referring Pearlee Arvizu: Treating Sakara Lehtinen/Extender: [name] in Treatment: 456 Compression Therapy Performed for Wound Assessment: Wound #5 Right,Medial Lower Leg Performed By: Clinician Carlene Coria, RN Compression Type: Double Layer Electronic Signature(s) Signed: 12/22/2020 3:12:05 PM By: Carlene Coria RN Entered By: Carlene Coria on 12/22/2020 12:10:20 -------------------------------------------------------------------------------- Encounter Discharge Information Details Patient Name: Date of Service: [name] [name] Drafts F. 12/22/2020 11:15 A M Medical Record Number: [id number] Patient Account Number: [id number] Date of Birth/Sex: Treating RN: 1951-08-01 (69 y.o. [name], [name] Primary Care Denice Cardon: [name] Other Clinician: Referring Mae Cianci: Treating Esty Ahuja/Extender: [name] in Treatment: [phone] Encounter Discharge Information Items Discharge Condition: Stable Ambulatory Status: Wheelchair Discharge Destination: Home Transportation: Private Auto Accompanied By: husband Schedule Follow-up Appointment: Yes Clinical Summary of Care: Patient Declined Electronic Signature(s) Signed: 12/22/2020 3:12:05 PM By: Carlene Coria RN Entered By: Carlene Coria on 12/22/2020 12:13:26 -------------------------------------------------------------------------------- Patient/Caregiver Education Details Patient Name: Date of Service: [name] [name] 12/23/2021andnbsp11:15 A M Medical Record Number: [id number] Patient Account Number: [id number] Date of Birth/Gender: Treating RN: Feb 09, 1951 (69 y.o. [name], [name] Primary Care Physician: [name] Other Clinician: Referring Physician: Treating Physician/Extender: [name] in Treatment: [phone] Education Assessment Education Provided To: Patient and Caregiver Education Topics Provided Wound/Skin Impairment: Methods:  Explain/Verbal Responses: State content correctly Electronic Signature(s) Signed: 12/22/2020 3:12:05 PM By: Carlene Coria RN Entered By: Carlene Coria on 12/22/2020 12:13:07 -------------------------------------------------------------------------------- Wound Assessment Details Patient Name: Date of Service: [name] [name] 12/22/2020 11:15 A M Medical Record Number: [id number] Patient Account Number: [id number] Date of Birth/Sex: Treating RN: 06-16-1951 (69 y.o. [name], [name] Primary Care Divonte Senger: [name] Other Clinician: Referring Kristian Hazzard: Treating Elica Almas/Extender: [name] in Treatment: 456 Wound Status Wound Number: 16 Primary Etiology: Pressure Ulcer Wound Location: Right Calcaneus Wound Status: Open Wounding Event: Pressure Injury Date Acquired: 01/04/2020 Weeks Of Treatment: 50 Clustered Wound: No Wound Measurements Length: (cm) 0.5 Width: (cm) 1.9 Depth: (cm) 0.6 Area: (cm) 0.746 Volume: (cm) 0.448 % Reduction in Area: 94.1% % Reduction in Volume: 64.4% Wound Description Classification: Category/Stage III Electronic Signature(s) Signed: 12/28/2020 2:09:35 PM By: Rhae Hammock RN Entered By: Rhae Hammock on 12/22/2020 11:46:24 -------------------------------------------------------------------------------- Wound Assessment Details Patient Name: Date of Service: [name] [name] Drafts F. 12/22/2020 11:15 A M Medical Record Number: [id number] Patient Account Number: [id number] Date of Birth/Sex: Treating RN: 01/03/1951 (69 y.o. [name], [name] Primary Care Lively Haberman: [name] Other Clinician: Referring Rorik Vespa: Treating Ever Halberg/Extender: [name] in Treatment: 456 Wound Status Wound Number: 19 Primary Etiology: Venous Leg Ulcer Wound Location: Right, Lateral Malleolus Wound Status: Open Wounding Event: Gradually Appeared Date Acquired: 05/02/2020 Weeks Of Treatment:  33 Clustered Wound: No Wound Measurements Length: (cm) 2.2 Width: (cm) 0.7 Depth: (cm) 0.2 Area: (cm) 1.21 Volume: (cm) 0.242 % Reduction in Area: -758.2% % Reduction in Volume: -764.3% Wound Description Classification: Full Thickness Without Exposed Support Structur es Electronic Signature(s) Signed: 12/28/2020 2:09:35 PM By: Rhae Hammock RN Entered By: Rhae Hammock on 12/22/2020 11:46:25 -------------------------------------------------------------------------------- Wound Assessment Details Patient Name: Date of Service: [name] LO CK, JO A N F. 12/22/2020 11:15  A M Medical Record Number: [id number] Patient Account Number: [id number] Date of Birth/Sex: Treating RN: 23-Mar-1951 (69 y.o. [name], [name] Primary Care Cadie Sorci: [name] Other Clinician: Referring Layani Foronda: Treating Ameilia Rattan/Extender: [name] in Treatment: 456 Wound Status Wound Number: 20 Primary Etiology: Arterial Insufficiency Ulcer Wound Location: Left, Lateral Malleolus Wound Status: Open Wounding Event: Gradually Appeared Date Acquired: 05/02/2020 Weeks Of Treatment: 33 Clustered Wound: No Wound Measurements Length: (cm) 1 Width: (cm) 0.8 Depth: (cm) 0.5 Area: (cm) 0.628 Volume: (cm) 0.314 % Reduction in Area: -42.7% % Reduction in Volume: -256.8% Wound Description Classification: Full Thickness Without Exposed Support Structur es Electronic Signature(s) Signed: 12/28/2020 2:09:35 PM By: Rhae Hammock RN Entered By: Rhae Hammock on 12/22/2020 11:46:25 -------------------------------------------------------------------------------- Wound Assessment Details Patient Name: Date of Service: [name] [name] Drafts F. 12/22/2020 11:15 A M Medical Record Number: [id number] Patient Account Number: [id number] Date of Birth/Sex: Treating RN: 10-15-1951 (69 y.o. [name], [name] Primary Care Oswin Johal: [name] Other Clinician: Referring  Kanon Colunga: Treating Tristyn Demarest/Extender: [name] in Treatment: 456 Wound Status Wound Number: 23 Primary Etiology: Venous Leg Ulcer Wound Location: Right, Posterior Lower Leg Wound Status: Open Wounding Event: Gradually Appeared Date Acquired: 08/15/2020 Weeks Of Treatment: 18 Clustered Wound: Yes Wound Measurements Length: (cm) 10 Width: (cm) 5 Depth: (cm) 0.9 Area: (cm) 39.27 Volume: (cm) 35.343 % Reduction in Area: -3396.9% % Reduction in Volume: -31456.2% Wound Description Classification: Full Thickness Without Exposed Support Structur es Electronic Signature(s) Signed: 12/28/2020 2:09:35 PM By: Rhae Hammock RN Entered By: Rhae Hammock on 12/22/2020 11:46:25 -------------------------------------------------------------------------------- Wound Assessment Details Patient Name: Date of Service: [name] [name] Drafts F. 12/22/2020 11:15 A M Medical Record Number: [id number] Patient Account Number: [id number] Date of Birth/Sex: Treating RN: 1951/01/22 (69 y.o. [name], [name] Primary Care Thania Woodlief: [name] Other Clinician: Referring Izear Pine: Treating Aarion Metzgar/Extender: [name] in Treatment: 456 Wound Status Wound Number: 24 Primary Etiology: Venous Leg Ulcer Wound Location: Right, Anterior Lower Leg Wound Status: Open Wounding Event: Gradually Appeared Date Acquired: 10/10/2020 Weeks Of Treatment: 10 Clustered Wound: No Wound Measurements Length: (cm) 0.9 Width: (cm) 0.5 Depth: (cm) 0.2 Area: (cm) 0.353 Volume: (cm) 0.071 % Reduction in Area: 70.8% % Reduction in Volume: 80.4% Wound Description Classification: Full Thickness Without Exposed Support Structur es Electronic Signature(s) Signed: 12/28/2020 2:09:35 PM By: Rhae Hammock RN Entered By: Rhae Hammock on 12/22/2020 11:46:26 -------------------------------------------------------------------------------- Wound  Assessment Details Patient Name: Date of Service: [name] [name] Drafts F. 12/22/2020 11:15 A M Medical Record Number: [id number] Patient Account Number: [id number] Date of Birth/Sex: Treating RN: 04-Sep-1951 (69 y.o. [name], [name] Primary Care Triva Hueber: [name] Other Clinician: Referring Adrick Kestler: Treating Doral Digangi/Extender: [name] in Treatment: 456 Wound Status Wound Number: 25 Primary Etiology: Venous Leg Ulcer Wound Location: Left, Lateral Foot Wound Status: Open Wounding Event: Gradually Appeared Date Acquired: 10/10/2020 Weeks Of Treatment: 10 Clustered Wound: No Wound Measurements Length: (cm) 0.7 Width: (cm) 0.5 Depth: (cm) 0.4 Area: (cm) 0.275 Volume: (cm) 0.11 % Reduction in Area: 27.1% % Reduction in Volume: -189.5% Wound Description Classification: Full Thickness Without Exposed Support Structur es Electronic Signature(s) Signed: 12/28/2020 2:09:35 PM By: Rhae Hammock RN Entered By: Rhae Hammock on 12/22/2020 11:46:26 -------------------------------------------------------------------------------- Wound Assessment Details Patient Name: Date of Service: [name] [name] Drafts F. 12/22/2020 11:15 A M Medical Record Number: [id number] Patient Account Number: [id number] Date of Birth/Sex: Treating RN: Oct 26, 1951 (69 y.o. F) Hollie Salk,  Key[place] Primary Care Ziare Cryder: [name] Other Clinician: Referring Michai Dieppa: Treating Merrilee Ancona/Extender: [name] in Treatment: 456 Wound Status Wound Number: 26 Primary Etiology: Venous Leg Ulcer Wound Location: Right, Lateral Lower Leg Wound Status: Open Wounding Event: Gradually Appeared Date Acquired: 10/27/2020 Weeks Of Treatment: 7 Clustered Wound: No Wound Measurements Length: (cm) 15 Width: (cm) 4 Depth: (cm) 0.2 Area: (cm) 47.124 Volume: (cm) 9.425 % Reduction in Area: -9420% % Reduction in Volume: -6268.2% Wound  Description Classification: Full Thickness Without Exposed Support Structur es Electronic Signature(s) Signed: 12/28/2020 2:09:35 PM By: Rhae Hammock RN Entered By: Rhae Hammock on 12/22/2020 11:46:26 -------------------------------------------------------------------------------- Wound Assessment Details Patient Name: Date of Service: [name] [name] Drafts F. 12/22/2020 11:15 A M Medical Record Number: [id number] Patient Account Number: [id number] Date of Birth/Sex: Treating RN: 05-24-1951 (69 y.o. [name], [name] Primary Care Delisia Mcquiston: [name] Other Clinician: Referring Shavonna Corella: Treating Anna-Marie Coller/Extender: [name] in Treatment: 456 Wound Status Wound Number: 27 Primary Etiology: Venous Leg Ulcer Wound Location: Right, Distal, Anterior Lower Leg Wound Status: Open Wounding Event: Gradually Appeared Date Acquired: 11/07/2020 Weeks Of Treatment: 6 Clustered Wound: No Wound Measurements Length: (cm) 0.7 Width: (cm) 0.5 Depth: (cm) 0.4 Area: (cm) 0.275 Volume: (cm) 0.11 % Reduction in Area: 2.8% % Reduction in Volume: -292.9% Wound Description Classification: Full Thickness Without Exposed Support Structur es Electronic Signature(s) Signed: 12/28/2020 2:09:35 PM By: Rhae Hammock RN Entered By: Rhae Hammock on 12/22/2020 11:46:27 -------------------------------------------------------------------------------- Wound Assessment Details Patient Name: Date of Service: [name] [name] Drafts F. 12/22/2020 11:15 A M Medical Record Number: [id number] Patient Account Number: [id number] Date of Birth/Sex: Treating RN: [birth year] (69 y.o. [name], [name] Primary Care Tatym Schermer: [name] Other Clinician: Referring Jaqueline Uber: Treating Dimitrious Micciche/Extender: [name] in Treatment: 456 Wound Status Wound Number: 28 Primary Etiology: Arterial Insufficiency Ulcer Wound Location: Left, Lateral Lower  Leg Wound Status: Open Wounding Event: Trauma Date Acquired: 12/09/2020 Weeks Of Treatment: 0 Clustered Wound: No Wound Measurements Length: (cm) 0.9 Width: (cm) 0.7 Depth: (cm) 0.1 Area: (cm) 0.495 Volume: (cm) 0.049 % Reduction in Area: 0% % Reduction in Volume: 0% Wound Description Classification: Full Thickness Without Exposed Support Structur es Electronic Signature(s) Signed: 12/28/2020 2:09:35 PM By: Rhae Hammock RN Entered By: Rhae Hammock on 12/22/2020 11:46:28 -------------------------------------------------------------------------------- Wound Assessment Details Patient Name: Date of Service: [name] [name] Drafts F. 12/22/2020 11:15 A M Medical Record Number: [id number] Patient Account Number: [id number] Date of Birth/Sex: Treating RN: 07/17/1951 (69 y.o. [name], [name] Primary Care Christophe Rising: [name] Other Clinician: Referring Chioke Noxon: Treating Antonela Freiman/Extender: [name] in Treatment: 456 Wound Status Wound Number: 29 Primary Etiology: Arterial Insufficiency Ulcer Wound Location: Right, Proximal, Lateral Lower Leg Wound Status: Open Wounding Event: Gradually Appeared Date Acquired: 12/14/2020 Weeks Of Treatment: 0 Clustered Wound: No Wound Measurements Length: (cm) 1.5 Width: (cm) 1 Depth: (cm) 0.6 Area: (cm) 1.178 Volume: (cm) 0.707 % Reduction in Area: 0% % Reduction in Volume: 0% Wound Description Classification: Full Thickness Without Exposed Support Structur Electronic Signature(s) Signed: 12/28/2020 2:09:35 PM By: Rhae Hammock RN Entered By: Paulina Fusi, [name] on 12/22/2020 11:46:28 -------------------------------------------------------------------------------- Wound Assessment Details Patient Name: Date of Service: [name] [name] Drafts F. 12/22/2020 11:15 A M Medical Record Number: [id number] Patient Account Number: [id number] Date of Birth/Sex: Treating RN: 08-06-1951 (69 y.o. [name], [name] Primary Care Delpha Perko: [name] Other Clinician: Referring Tjay Velazquez: Treating Jasmaine Rochel/Extender: Phillis Haggis,  Mary Weeks in Treatment: 456 Wound Status Wound Number: 5 Primary Etiology: Venous Leg Ulcer Wound Location: Right, Medial Lower Leg Wound Status: Open Wounding Event: Gradually Appeared Date Acquired: 01/13/2013 Weeks Of Treatment: 413 Clustered Wound: Yes Wound Measurements Length: (cm) 2.5 Width: (cm) 2.2 Depth: (cm) 0.2 Area: (cm) 4.32 Volume: (cm) 0.864 % Reduction in Area: -205.5% % Reduction in Volume: -512.8% Wound Description Classification: Full Thickness With Exposed Support Structures Electronic Signature(s) Signed: 12/28/2020 2:09:35 PM By: Rhae Hammock RN Entered By: Rhae Hammock on 12/22/2020 11:46:29 -------------------------------------------------------------------------------- Vitals Details Patient Name: Date of Service: [name] LO CK, [name] 12/22/2020 11:15 A M Medical Record Number: [id number] Patient Account Number: [id number] Date of Birth/Sex: Treating RN: 06/26/1951 (69 y.o. [name], [name] Primary Care Akshar Starnes: [name] Other Clinician: Referring Awanda Wilcock: Treating Shrihan Putt/Extender: [name] in Treatment: 456 Vital Signs Time Taken: 11:44 Temperature (F): 98.7 Height (in): 68 Pulse (bpm): 84 Weight (lbs): 132 Respiratory Rate (breaths/min): 17 Body Mass Index (BMI): 20.1 Blood Pressure (mmHg): 132/74 Reference Range: 80 - 120 mg / dl Electronic Signature(s) Signed: 12/28/2020 2:09:35 PM By: Rhae Hammock RN Entered By: Rhae Hammock on 12/22/2020 11:44:45

## 2020-12-29 ENCOUNTER — Encounter: Payer: Self-pay | Admitting: *Deleted

## 2020-12-29 ENCOUNTER — Ambulatory Visit (INDEPENDENT_AMBULATORY_CARE_PROVIDER_SITE_OTHER): Payer: Medicare Other | Admitting: Vascular Surgery

## 2020-12-29 ENCOUNTER — Encounter: Payer: Self-pay | Admitting: Vascular Surgery

## 2020-12-29 ENCOUNTER — Other Ambulatory Visit (HOSPITAL_COMMUNITY)
Admission: RE | Admit: 2020-12-29 | Discharge: 2020-12-29 | Disposition: A | Discharge status: Home / Self Care | Payer: Medicare Other | Source: Ambulatory Visit | Attending: Vascular Surgery | Admitting: Vascular Surgery

## 2020-12-29 ENCOUNTER — Other Ambulatory Visit: Payer: Self-pay

## 2020-12-29 ENCOUNTER — Other Ambulatory Visit: Payer: Self-pay | Admitting: *Deleted

## 2020-12-29 VITALS — BP 133/65 | HR 83 | Temp 98.0°F | Resp 18 | Ht 67.5 in | Wt 119.0 lb

## 2020-12-29 DIAGNOSIS — Z20822 Contact with and (suspected) exposure to covid-19: Secondary | ICD-10-CM | POA: Insufficient documentation

## 2020-12-29 DIAGNOSIS — I779 Disorder of arteries and arterioles, unspecified: Secondary | ICD-10-CM

## 2020-12-29 DIAGNOSIS — Z01812 Encounter for preprocedural laboratory examination: Secondary | ICD-10-CM | POA: Insufficient documentation

## 2020-12-29 DIAGNOSIS — I739 Peripheral vascular disease, unspecified: Secondary | ICD-10-CM | POA: Diagnosis not present

## 2020-12-29 LAB — SARS CORONAVIRUS 2 (TAT 6-24 HRS): SARS Coronavirus 2: NEGATIVE

## 2020-12-29 NOTE — Progress Notes (Signed)
Referring Physician: Dr Dellia Nims  Patient name: [name] [name] MRN: [id number] DOB: [birth year] Sex: female  REASON FOR CONSULT: Worsening wounds right lower extremity  HPI: [name] [name] is a 69 y.o. female, who has had multiple prior arterial and venous interventions.  Most recently she had wounds on her left leg which have essentially healed after an intervention by Dr. Trula Slade earlier this year.  She now presents with new rapidly enlarging wounds in her right lower extremity.  She previously underwent bilateral femoral endarterectomies by Dr. [name].  She has also had laser ablation of her right and left greater saphenous veins by Dr. [name].  She had angioplasty of her left tibioperoneal trunk and anterior tibial artery by Dr. Trula Slade October 2021.  She had angioplasty of her right anterior tibial tibioperoneal trunk and superficial femoral artery by Dr. Trula Slade also in October 2021.  Other medical problems include home oxygen 3 L, scleroderma she has also had prior amputation of 3 fingers on her right hand from peripheral arterial disease.  She has had prior amputation of multiple toes.  Past Medical History:  Diagnosis Date  . Anemia   . Arthritis   . DOE (dyspnea on exertion)   . Epistaxis   . History of chicken pox   . Hypertension   . Hypothyroidism   . Leg ulcer ([place])   . Melanoma ([place]) 1999  . 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 Non Hodgkin's lymphoma ([place])    Treated with chemo 2010, felt to be cured.  Marland Kitchen PAH (pulmonary artery hypertension) ([place])   . Peripheral arterial occlusive disease ([place])   . Pulmonary hypertension ([place])   . Requires supplemental oxygen    2 liters-3liters when moving  . 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 ABDOMINAL AORTOGRAM W/LOWER EXTREMITY Bilateral 10/04/2020   Procedure: ABDOMINAL AORTOGRAM W/LOWER EXTREMITY;  Surgeon: Serafina Mitchell, MD;  Location: [place] [facility];  Service: Cardiovascular;   Laterality: Bilateral;  . AMPUTATION Right 10/24/2017   Procedure: RAY resection right index;  Surgeon: Daryll Brod, MD;  Location: [place];  Service: Orthopedics;  Laterality: Right;  Axillary block  . AMPUTATION Right 11/21/2018   Procedure: AMPUTATION RAY RIGHT MIDDLE FINGER;  Surgeon: Daryll Brod, MD;  Location: [place];  Service: Orthopedics;  Laterality: Right;  . AMPUTATION Right 10/22/2019   Procedure: AMPUTATION DIGIT RIGHT RING AND SMALL FINGER, DIGITAL SYMPATHECTOMY RIGHT RADIAL ULNAR ARCH;  Surgeon: Daryll Brod, MD;  Location: [place];  Service: Orthopedics;  Laterality: Right;  AXILARY  . apligraph  12-04-2102,   12-18-2012   bilateral calves   [name]  . biopsy on cerebellum    . COLONOSCOPY WITH PROPOFOL Left 10/07/2015   Procedure: COLONOSCOPY WITH PROPOFOL;  Surgeon: [name], MD;  Location: [place];  Service: Endoscopy;  Laterality: Left;  . ENDARTERECTOMY FEMORAL Right 09/14/2016   Procedure: ENDARTERECTOMY FEMORAL RIGHT;  Surgeon: [name], MD;  Location: [facility] OR;  Service: Vascular;  Laterality: Right;  . ENDOVENOUS ABLATION SAPHENOUS VEIN W/ LASER  07-24-2012   right greater saphenous vein by [name], M.D.   . ENDOVENOUS ABLATION SAPHENOUS VEIN W/ LASER  10-16-2012   left greater saphenous vein  by Dr. [name]  . ENDOVENOUS ABLATION SAPHENOUS VEIN W/ LASER Right 09-01-2015   endovenous laser ablation right greater saphenous vein by [name] MD  . ESOPHAGOGASTRODUODENOSCOPY N/A 05/07/2016   Procedure: ESOPHAGOGASTRODUODENOSCOPY (EGD);  Surgeon: [name], MD;  Location: [facility] ENDOSCOPY;  Service: Endoscopy;  Laterality: N/A;  . ESOPHAGOGASTRODUODENOSCOPY N/A 11/23/2020   Procedure: ESOPHAGOGASTRODUODENOSCOPY (EGD);  Surgeon: [name], MD;  Location: [facility];  Service: Endoscopy;  Laterality: N/A;  . ESOPHAGOGASTRODUODENOSCOPY (EGD) WITH PROPOFOL Left 10/07/2015   Procedure: ESOPHAGOGASTRODUODENOSCOPY (EGD) WITH  PROPOFOL;  Surgeon: [name], MD;  Location: [place];  Service: Endoscopy;  Laterality: Left;  . GIVENS CAPSULE STUDY N/A 07/31/2016   Procedure: GIVENS CAPSULE STUDY;  Surgeon: [name], MD;  Location: [facility] ENDOSCOPY;  Service: Endoscopy;  Laterality: N/A;  . GIVENS CAPSULE STUDY N/A 11/23/2020   Procedure: GIVENS CAPSULE STUDY;  Surgeon: [name], MD;  Location: [facility] ENDOSCOPY;  Service: Endoscopy;  Laterality: N/A;  . IR IVC FILTER PLMT / S&I Burke Keels GUID/MOD SED  11/22/2020  . LOWER EXTREMITY ANGIOGRAPHY N/A 10/25/2020   Procedure: LOWER EXTREMITY ANGIOGRAPHY - Left;  Surgeon: Serafina Mitchell, MD;  Location: [place] [facility];  Service: Cardiovascular;  Laterality: N/A;  . melonoma removes Right    behind right knee  . OPEN REDUCTION INTERNAL FIXATION (ORIF) DISTAL RADIAL FRACTURE Left 11/24/2019   Procedure: OPEN REDUCTION INTERNAL FIXATION (ORIF) DISTAL RADIAL FRACTURE;  Surgeon: Altamese Delleker, MD;  Location: [place];  Service: Orthopedics;  Laterality: Left;  . PATCH ANGIOPLASTY Right 09/14/2016   Procedure: PATCH ANGIOPLASTY RIGHT FEMORAL ARTERY USING HEMASHIELD PLATINUM FINESSE PATCH;  Surgeon: [name], MD;  Location: [place];  Service: Vascular;  Laterality: Right;  . PERIPHERAL VASCULAR BALLOON ANGIOPLASTY Right 10/04/2020   Procedure: PERIPHERAL VASCULAR BALLOON ANGIOPLASTY;  Surgeon: Serafina Mitchell, MD;  Location: [place] [facility];  Service: Cardiovascular;  Laterality: Right;  SFA/PT/AT/PTtrunk  . PERIPHERAL VASCULAR BALLOON ANGIOPLASTY Left 10/25/2020   Procedure: PERIPHERAL VASCULAR BALLOON ANGIOPLASTY;  Surgeon: Serafina Mitchell, MD;  Location: [place] [facility];  Service: Cardiovascular;  Laterality: Left;  anterior tibial and tibioperoneal trunk  . PERIPHERAL VASCULAR CATHETERIZATION N/A 09/07/2016   Procedure: Abdominal Aortogram w/Lower Extremity;  Surgeon: [name], MD;  Location: [place] [facility];  Service: Cardiovascular;  Laterality: N/A;  . RIGHT  HEART CATH N/A 04/19/2017   Procedure: Right Heart Cath;  Surgeon: [name], MD;  Location: [place] [facility];  Service: Cardiovascular;  Laterality: N/A;  . RIGHT HEART CATHETERIZATION N/A 11/14/2012   Procedure: RIGHT HEART CATH;  Surgeon: [name], MD;  Location: [facility] CATH LAB;  Service: Cardiovascular;  Laterality: N/A;  . rt little toe removal  10/1998  . stent left thigh  3/11  . TENDON TRANSFER Right 10/24/2017   Procedure: transfer extensor indecus propius/extensor digitorum commomus index to middle ring and small;  Surgeon: Daryll Brod, MD;  Location: [place];  Service: Orthopedics;  Laterality: Right;  . TONSILLECTOMY    . TONSILLECTOMY AND ADENOIDECTOMY    . TUNNELED VENOUS CATHETER PLACEMENT  01/2013  . ULNAR HEAD EXCISION Right 10/24/2017   Procedure: darrach right ulna;  Surgeon: Daryll Brod, MD;  Location: [place];  Service: Orthopedics;  Laterality: Right;  . VEIN LIGATION AND STRIPPING  05/16/2012   Procedure: VEIN LIGATION AND STRIPPING;  Surgeon: [name], MD;  Location: [facility] OR;  Service: Vascular;  Laterality: Right;  Irrigation and Debridement right lower leg, ligation of saphenous vein.    Family History  Problem Relation Age of Onset  . Lupus Father   . Lymphoma Father   . Hyperlipidemia Mother   . Cancer Daughter  sarcoma  . Colon cancer Other     SOCIAL HISTORY: Social History   Socioeconomic History  . Marital status: Married    Spouse name: Not on file  . Number of children: 1  . Years of education: Not on file  . Highest education level: Not on file  Occupational History  . Occupation: homemaker  Tobacco Use  . Smoking status: Never Smoker  . Smokeless tobacco: Never Used  Vaping Use  . Vaping Use: Never used  Substance and Sexual Activity  . Alcohol use: Yes    Alcohol/week: 0.0 standard drinks    Comment: 1-2 drinks per day wine.liquor or beer  . Drug use: No  . Sexual activity: Not Currently  Other Topics Concern  .  Not on file  Social History Narrative  . Not on file   Social Determinants of Health   Financial Resource Strain: Not on file  Food Insecurity: Not on file  Transportation Needs: Not on file  Physical Activity: Not on file  Stress: Not on file  Social Connections: Not on file  Intimate Partner Violence: Not on file    Allergies  Allergen Reactions  . Levofloxacin Other (See Comments)    FLU LIKE SYMPTOM   . Sulfa Antibiotics Rash and Other (See Comments)    Face peeled Severe rash with significant peeling of face. No airway involvement per patient   . Doxycycline Hyclate Swelling    SWELLING REACTION UNSPECIFIED  "TABLETS ONLY - CAPSULES ARE TOLERATED FINE"  . Penicillins Rash    Has patient had a PCN reaction causing immediate rash, facial/tongue/throat swelling, SOB or lightheadedness with hypotension: no Has patient had a PCN reaction causing severe rash involving mucus membranes or skin necrosis: no Has patient had a PCN reaction that required hospitalization: no Has patient had a PCN reaction occurring within the last 10 years: {no If all of the above answers are "NO", then may proceed with Cephalosporin use.    Current Outpatient Medications  Medication Sig Dispense Refill  . acetaminophen (TYLENOL) 650 MG CR tablet Take 1,300 mg by mouth every 8 (eight) hours as needed for pain.     . Biotin 5000 MCG TABS Take 5,000 mcg by mouth daily.    . Calcium Carbonate-Vit D-Min (CALCIUM 1200 PO) Take 1,200 mg by mouth daily.    . Cholecalciferol (VITAMIN D) 2000 units CAPS Take 2,000 Units by mouth daily.    . citalopram (CELEXA) 20 MG tablet TAKE 1 TABLET BY MOUTH EVERYDAY AT BEDTIME (Patient taking differently: Take 20 mg by mouth at bedtime.) 90 tablet 3  . clopidogrel (PLAVIX) 75 MG tablet TAKE 1 TABLET BY MOUTH EVERY DAY (Patient taking differently: Take 75 mg by mouth daily.) 90 tablet 3  . doxycycline (MONODOX) 100 MG capsule Take 100 mg by mouth 2 (two) times daily.  Start date : 11/17/20    . FERREX 150 150 MG capsule TAKE 1 CAPSULE BY MOUTH TWICE A DAY (Patient taking differently: Take 150 mg by mouth 2 (two) times daily.) 180 capsule 1  . fish oil-omega-3 fatty acids 1000 MG capsule Take 1,000 mg by mouth 2 (two) times daily.    . folic acid (FOLVITE) 1 MG tablet TAKE 1 TABLET BY MOUTH EVERY DAY (Patient taking differently: Take 1 mg by mouth daily.) 90 tablet 1  . levothyroxine (SYNTHROID) 50 MCG tablet TAKE 1 TABLET BY MOUTH DAILY BEFORE BREAKFAST (Patient taking differently: Take 50 mcg by mouth daily before breakfast.) 90 tablet 1  .  loperamide (IMODIUM) 2 MG capsule Take 2 mg by mouth daily as needed for diarrhea or loose stools.     Marland Kitchen MAGNESIUM ASPARTATE PO Take 1 tablet by mouth in the morning and at bedtime.    . Multiple Vitamin (MULTIVITAMIN WITH MINERALS) TABS tablet Take 1 tablet by mouth daily.    . OPSUMIT 10 MG tablet Take 1 tablet (65m) by mouth daily. (Patient taking differently: Take 10 mg by mouth daily.) 30 tablet 11  . OXYGEN Inhale 2-3 L into the lungs continuous.     . pantoprazole (PROTONIX) 40 MG tablet Take 1 tablet (40 mg total) by mouth 2 (two) times daily. 180 tablet 3  . potassium chloride 20 MEQ/15ML (10%) SOLN TAKE 2 TEASPOONFUL (DILUTED IN WATER) ONCE DAILY (Patient taking differently: Take 40 mEq by mouth daily.) 473 mL 11  . predniSONE (DELTASONE) 5 MG tablet Take 5 mg by mouth daily.    . RESTASIS 0.05 % ophthalmic emulsion Place 2 drops into both eyes 2 (two) times daily.     . rosuvastatin (CRESTOR) 5 MG tablet Take 1 tablet every Monday, Wednesday, and Friday. (Patient taking differently: Take 5 mg by mouth every Monday, Wednesday, and Friday.) 45 tablet 3  . SANTYL ointment Apply 1 application topically every other day.     . sildenafil (REVATIO) 20 MG tablet TAKE 3 TABLETS (60 MG TOTAL) BY MOUTH 3 (THREE) TIMES DAILY. PLEASE CALL FOR OFFICE VISIT [phone](Patient taking differently: Take 60 mg by mouth 3 (three)  times daily.) 810 tablet 1  . spironolactone (ALDACTONE) 25 MG tablet TAKE 1 TABLET BY MOUTH EVERY DAY (Patient taking differently: Take 25 mg by mouth daily.) 90 tablet 0  . torsemide (DEMADEX) 20 MG tablet Take 40 mg by mouth See admin instructions. Takes 40 mg in the morning and 20 mg at night    . traMADol (ULTRAM) 50 MG tablet Take 50 mg by mouth every 6 (six) hours as needed.    . valACYclovir (VALTREX) 500 MG tablet Take 1 tablet (500 mg total) by mouth 2 (two) times daily. (Patient taking differently: Take 500 mg by mouth daily as needed (lip sore).) 10 tablet 0  . vitamin B-12 (CYANOCOBALAMIN) 1000 MCG tablet Take 1,000 mcg by mouth daily.     No current facility-administered medications for this visit.    ROS:   General:  No weight loss, Fever, chills  HEENT: No recent headaches, no nasal bleeding, no visual changes, no sore throat  Neurologic: No dizziness, blackouts, seizures. No recent symptoms of stroke or mini- stroke. No recent episodes of slurred speech, or temporary blindness.  Cardiac: No recent episodes of chest pain/pressure, no shortness of breath at rest.  + shortness of breath with exertion.  Denies history of atrial fibrillation or irregular heartbeat    Physical Examination  Vitals:   12/29/20 1107  BP: 133/65  Pulse: 83  Resp: 18  Temp: 98 F (36.7 C)  SpO2: 93%  Weight: 119 lb (54 kg)  Height: 5' 7.5" (1.715 m)    Body mass index is 18.36 kg/m.  General:  Alert and oriented, no acute distress HEENT: Normal Neck: No JVD Cardiac: Regular Rate and Rhythm vaginal irregular beat Abdomen: Soft, non-tender, non-distended, no mass, no scars Skin: No rash, large full-thickness ulceration right posterior calf approximately 4 x 4 cm with exposed muscle, gangrene tip of right fourth toe ulceration left lateral malleolus and over the left lateral tarsal bone approximately 2 cm diameter with some granulation  tissue Extremity Pulses:  2+ femoral, absent  popliteal dorsalis pedis, posterior tibial pulses bilaterally Musculoskeletal: No deformity or edema  Neurologic: Upper and lower extremity motor 5/5 and symmetric  DATA:  I reviewed the patient's recent arteriograms from October reflective of history of present illness.  She also had noninvasive exam December of this year which showed a toe pressure of 49 on the left side.  Toe pressure on the right side was not done.  She has calcified noncompressible vessels.  ASSESSMENT: Rapidly enlarging wound right lower extremity with gangrene tip of right fourth toe at risk for limb loss previous SFA and tibial interventions in the right leg.   PLAN: Aortogram lower extremity runoff by Dr. Donzetta Matters on Monday, January 03, 2020.  Risk benefits possible complications procedure details were discussed the patient today.  She understands agrees to proceed.  Ruta Hinds, MD [facility] Office: [phone]

## 2020-12-29 NOTE — H&P (View-Only) (Signed)
Referring Physician: Dr Dellia Nims  Patient name: [name] [name] MRN: [id number] DOB: 01/28/1951 Sex: female  REASON FOR CONSULT: Worsening wounds right lower extremity  HPI: [name] [name] is a 69 y.o. female, who has had multiple prior arterial and venous interventions.  Most recently she had wounds on her left leg which have essentially healed after an intervention by Dr. Trula Slade earlier this year.  She now presents with new rapidly enlarging wounds in her right lower extremity.  She previously underwent bilateral femoral endarterectomies by Dr. [name].  She has also had laser ablation of her right and left greater saphenous veins by Dr. [name].  She had angioplasty of her left tibioperoneal trunk and anterior tibial artery by Dr. Trula Slade October 2021.  She had angioplasty of her right anterior tibial tibioperoneal trunk and superficial femoral artery by Dr. Trula Slade also in October 2021.  Other medical problems include home oxygen 3 L, scleroderma she has also had prior amputation of 3 fingers on her right hand from peripheral arterial disease.  She has had prior amputation of multiple toes.  Past Medical History:  Diagnosis Date  . Anemia   . Arthritis   . DOE (dyspnea on exertion)   . Epistaxis   . History of chicken pox   . Hypertension   . Hypothyroidism   . Leg ulcer ([place])   . Melanoma ([place]) 1999  . 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[place])   . Non Hodgkin's lymphoma ([place])    Treated with chemo 2010, felt to be cured.  Marland Kitchen PAH (pulmonary artery hypertension) ([place])   . Peripheral arterial occlusive disease ([place])   . Pulmonary hypertension ([place])   . Requires supplemental oxygen    2 liters-3liters when moving  . 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 ABDOMINAL AORTOGRAM W/LOWER EXTREMITY Bilateral 10/04/2020   Procedure: ABDOMINAL AORTOGRAM W/LOWER EXTREMITY;  Surgeon: Serafina Mitchell, MD;  Location: [place] [facility];  Service: Cardiovascular;   Laterality: Bilateral;  . AMPUTATION Right 10/24/2017   Procedure: RAY resection right index;  Surgeon: Daryll Brod, MD;  Location: [place];  Service: Orthopedics;  Laterality: Right;  Axillary block  . AMPUTATION Right 11/21/2018   Procedure: AMPUTATION RAY RIGHT MIDDLE FINGER;  Surgeon: Daryll Brod, MD;  Location: [place];  Service: Orthopedics;  Laterality: Right;  . AMPUTATION Right 10/22/2019   Procedure: AMPUTATION DIGIT RIGHT RING AND SMALL FINGER, DIGITAL SYMPATHECTOMY RIGHT RADIAL ULNAR ARCH;  Surgeon: Daryll Brod, MD;  Location: [place];  Service: Orthopedics;  Laterality: Right;  AXILARY  . apligraph  12-04-2102,   12-18-2012   bilateral calves   [name]  . biopsy on cerebellum    . COLONOSCOPY WITH PROPOFOL Left 10/07/2015   Procedure: COLONOSCOPY WITH PROPOFOL;  Surgeon: [name], MD;  Location: [place];  Service: Endoscopy;  Laterality: Left;  . ENDARTERECTOMY FEMORAL Right 09/14/2016   Procedure: ENDARTERECTOMY FEMORAL RIGHT;  Surgeon: [name], MD;  Location[facility] OR;  Service: Vascular;  Laterality: Right;  . ENDOVENOUS ABLATION SAPHENOUS VEIN W/ LASER  07-24-2012   right greater saphenous vein by [name], M.D.   . ENDOVENOUS ABLATION SAPHENOUS VEIN W/ LASER  10-16-2012   left greater saphenous vein  by Dr. [name]  . ENDOVENOUS ABLATION SAPHENOUS VEIN W/ LASER Right 09-01-2015   endovenous laser ablation right greater saphenous vein by [name] MD  . ESOPHAGOGASTRODUODENOSCOPY N/A 05/07/2016   Procedure: ESOPHAGOGASTRODUODENOSCOPY (EGD);  Surgeon: [name], MD;  Location: [facility] ENDOSCOPY;  Service: Endoscopy;  Laterality: N/A;  . ESOPHAGOGASTRODUODENOSCOPY N/A 11/23/2020   Procedure: ESOPHAGOGASTRODUODENOSCOPY (EGD);  Surgeon: [name], MD;  Location: [facility];  Service: Endoscopy;  Laterality: N/A;  . ESOPHAGOGASTRODUODENOSCOPY (EGD) WITH PROPOFOL Left 10/07/2015   Procedure: ESOPHAGOGASTRODUODENOSCOPY (EGD) WITH  PROPOFOL;  Surgeon: [name], MD;  Location: [place];  Service: Endoscopy;  Laterality: Left;  . GIVENS CAPSULE STUDY N/A 07/31/2016   Procedure: GIVENS CAPSULE STUDY;  Surgeon: [name], MD;  Location: [facility] ENDOSCOPY;  Service: Endoscopy;  Laterality: N/A;  . GIVENS CAPSULE STUDY N/A 11/23/2020   Procedure: GIVENS CAPSULE STUDY;  Surgeon: [name], MD;  Location: [facility] ENDOSCOPY;  Service: Endoscopy;  Laterality: N/A;  . IR IVC FILTER PLMT / S&I Burke Keels GUID/MOD SED  11/22/2020  . LOWER EXTREMITY ANGIOGRAPHY N/A 10/25/2020   Procedure: LOWER EXTREMITY ANGIOGRAPHY - Left;  Surgeon: Serafina Mitchell, MD;  Location: [place] [facility];  Service: Cardiovascular;  Laterality: N/A;  . melonoma removes Right    behind right knee  . OPEN REDUCTION INTERNAL FIXATION (ORIF) DISTAL RADIAL FRACTURE Left 11/24/2019   Procedure: OPEN REDUCTION INTERNAL FIXATION (ORIF) DISTAL RADIAL FRACTURE;  Surgeon: Altamese Duncan, MD;  Location: [place];  Service: Orthopedics;  Laterality: Left;  . PATCH ANGIOPLASTY Right 09/14/2016   Procedure: PATCH ANGIOPLASTY RIGHT FEMORAL ARTERY USING HEMASHIELD PLATINUM FINESSE PATCH;  Surgeon: [name], MD;  Location: [place];  Service: Vascular;  Laterality: Right;  . PERIPHERAL VASCULAR BALLOON ANGIOPLASTY Right 10/04/2020   Procedure: PERIPHERAL VASCULAR BALLOON ANGIOPLASTY;  Surgeon: Serafina Mitchell, MD;  Location: [place] [facility];  Service: Cardiovascular;  Laterality: Right;  SFA/PT/AT/PTtrunk  . PERIPHERAL VASCULAR BALLOON ANGIOPLASTY Left 10/25/2020   Procedure: PERIPHERAL VASCULAR BALLOON ANGIOPLASTY;  Surgeon: Serafina Mitchell, MD;  Location: [place] [facility];  Service: Cardiovascular;  Laterality: Left;  anterior tibial and tibioperoneal trunk  . PERIPHERAL VASCULAR CATHETERIZATION N/A 09/07/2016   Procedure: Abdominal Aortogram w/Lower Extremity;  Surgeon: [name], MD;  Location: [place] [facility];  Service: Cardiovascular;  Laterality: N/A;  . RIGHT  HEART CATH N/A 04/19/2017   Procedure: Right Heart Cath;  Surgeon: [name], MD;  Location: [facility];  Service: Cardiovascular;  Laterality: N/A;  . RIGHT HEART CATHETERIZATION N/A 11/14/2012   Procedure: RIGHT HEART CATH;  Surgeon: [name], MD;  Location: [facility] CATH LAB;  Service: Cardiovascular;  Laterality: N/A;  . rt little toe removal  10/1998  . stent left thigh  3/11  . TENDON TRANSFER Right 10/24/2017   Procedure: transfer extensor indecus propius/extensor digitorum commomus index to middle ring and small;  Surgeon: Daryll Brod, MD;  Location: [place];  Service: Orthopedics;  Laterality: Right;  . TONSILLECTOMY    . TONSILLECTOMY AND ADENOIDECTOMY    . TUNNELED VENOUS CATHETER PLACEMENT  01/2013  . ULNAR HEAD EXCISION Right 10/24/2017   Procedure: darrach right ulna;  Surgeon: Daryll Brod, MD;  Location: [place];  Service: Orthopedics;  Laterality: Right;  . VEIN LIGATION AND STRIPPING  05/16/2012   Procedure: VEIN LIGATION AND STRIPPING;  Surgeon: [name], MD;  Location: [facility] OR;  Service: Vascular;  Laterality: Right;  Irrigation and Debridement right lower leg, ligation of saphenous vein.    Family History  Problem Relation Age of Onset  . Lupus Father   . Lymphoma Father   . Hyperlipidemia Mother   . Cancer Daughter  sarcoma  . Colon cancer Other     SOCIAL HISTORY: Social History   Socioeconomic History  . Marital status: Married    Spouse name: Not on file  . Number of children: 1  . Years of education: Not on file  . Highest education level: Not on file  Occupational History  . Occupation: homemaker  Tobacco Use  . Smoking status: Never Smoker  . Smokeless tobacco: Never Used  Vaping Use  . Vaping Use: Never used  Substance and Sexual Activity  . Alcohol use: Yes    Alcohol/week: 0.0 standard drinks    Comment: 1-2 drinks per day wine.liquor or beer  . Drug use: No  . Sexual activity: Not Currently  Other Topics Concern  .  Not on file  Social History Narrative  . Not on file   Social Determinants of Health   Financial Resource Strain: Not on file  Food Insecurity: Not on file  Transportation Needs: Not on file  Physical Activity: Not on file  Stress: Not on file  Social Connections: Not on file  Intimate Partner Violence: Not on file    Allergies  Allergen Reactions  . Levofloxacin Other (See Comments)    FLU LIKE SYMPTOM   . Sulfa Antibiotics Rash and Other (See Comments)    Face peeled Severe rash with significant peeling of face. No airway involvement per patient   . Doxycycline Hyclate Swelling    SWELLING REACTION UNSPECIFIED  "TABLETS ONLY - CAPSULES ARE TOLERATED FINE"  . Penicillins Rash    Has patient had a PCN reaction causing immediate rash, facial/tongue/throat swelling, SOB or lightheadedness with hypotension: no Has patient had a PCN reaction causing severe rash involving mucus membranes or skin necrosis: no Has patient had a PCN reaction that required hospitalization: no Has patient had a PCN reaction occurring within the last 10 years: {no If all of the above answers are "NO", then may proceed with Cephalosporin use.    Current Outpatient Medications  Medication Sig Dispense Refill  . acetaminophen (TYLENOL) 650 MG CR tablet Take 1,300 mg by mouth every 8 (eight) hours as needed for pain.     . Biotin 5000 MCG TABS Take 5,000 mcg by mouth daily.    . Calcium Carbonate-Vit D-Min (CALCIUM 1200 PO) Take 1,200 mg by mouth daily.    . Cholecalciferol (VITAMIN D) 2000 units CAPS Take 2,000 Units by mouth daily.    . citalopram (CELEXA) 20 MG tablet TAKE 1 TABLET BY MOUTH EVERYDAY AT BEDTIME (Patient taking differently: Take 20 mg by mouth at bedtime.) 90 tablet 3  . clopidogrel (PLAVIX) 75 MG tablet TAKE 1 TABLET BY MOUTH EVERY DAY (Patient taking differently: Take 75 mg by mouth daily.) 90 tablet 3  . doxycycline (MONODOX) 100 MG capsule Take 100 mg by mouth 2 (two) times daily.  Start date : 11/17/20    . FERREX 150 150 MG capsule TAKE 1 CAPSULE BY MOUTH TWICE A DAY (Patient taking differently: Take 150 mg by mouth 2 (two) times daily.) 180 capsule 1  . fish oil-omega-3 fatty acids 1000 MG capsule Take 1,000 mg by mouth 2 (two) times daily.    . folic acid (FOLVITE) 1 MG tablet TAKE 1 TABLET BY MOUTH EVERY DAY (Patient taking differently: Take 1 mg by mouth daily.) 90 tablet 1  . levothyroxine (SYNTHROID) 50 MCG tablet TAKE 1 TABLET BY MOUTH DAILY BEFORE BREAKFAST (Patient taking differently: Take 50 mcg by mouth daily before breakfast.) 90 tablet 1  .  loperamide (IMODIUM) 2 MG capsule Take 2 mg by mouth daily as needed for diarrhea or loose stools.     Marland Kitchen MAGNESIUM ASPARTATE PO Take 1 tablet by mouth in the morning and at bedtime.    . Multiple Vitamin (MULTIVITAMIN WITH MINERALS) TABS tablet Take 1 tablet by mouth daily.    . OPSUMIT 10 MG tablet Take 1 tablet (18m) by mouth daily. (Patient taking differently: Take 10 mg by mouth daily.) 30 tablet 11  . OXYGEN Inhale 2-3 L into the lungs continuous.     . pantoprazole (PROTONIX) 40 MG tablet Take 1 tablet (40 mg total) by mouth 2 (two) times daily. 180 tablet 3  . potassium chloride 20 MEQ/15ML (10%) SOLN TAKE 2 TEASPOONFUL (DILUTED IN WATER) ONCE DAILY (Patient taking differently: Take 40 mEq by mouth daily.) 473 mL 11  . predniSONE (DELTASONE) 5 MG tablet Take 5 mg by mouth daily.    . RESTASIS 0.05 % ophthalmic emulsion Place 2 drops into both eyes 2 (two) times daily.     . rosuvastatin (CRESTOR) 5 MG tablet Take 1 tablet every Monday, Wednesday, and Friday. (Patient taking differently: Take 5 mg by mouth every Monday, Wednesday, and Friday.) 45 tablet 3  . SANTYL ointment Apply 1 application topically every other day.     . sildenafil (REVATIO) 20 MG tablet TAKE 3 TABLETS (60 MG TOTAL) BY MOUTH 3 (THREE) TIMES DAILY. PLEASE CALL FOR OFFICE VISIT [phone](Patient taking differently: Take 60 mg by mouth 3 (three)  times daily.) 810 tablet 1  . spironolactone (ALDACTONE) 25 MG tablet TAKE 1 TABLET BY MOUTH EVERY DAY (Patient taking differently: Take 25 mg by mouth daily.) 90 tablet 0  . torsemide (DEMADEX) 20 MG tablet Take 40 mg by mouth See admin instructions. Takes 40 mg in the morning and 20 mg at night    . traMADol (ULTRAM) 50 MG tablet Take 50 mg by mouth every 6 (six) hours as needed.    . valACYclovir (VALTREX) 500 MG tablet Take 1 tablet (500 mg total) by mouth 2 (two) times daily. (Patient taking differently: Take 500 mg by mouth daily as needed (lip sore).) 10 tablet 0  . vitamin B-12 (CYANOCOBALAMIN) 1000 MCG tablet Take 1,000 mcg by mouth daily.     No current facility-administered medications for this visit.    ROS:   General:  No weight loss, Fever, chills  HEENT: No recent headaches, no nasal bleeding, no visual changes, no sore throat  Neurologic: No dizziness, blackouts, seizures. No recent symptoms of stroke or mini- stroke. No recent episodes of slurred speech, or temporary blindness.  Cardiac: No recent episodes of chest pain/pressure, no shortness of breath at rest.  + shortness of breath with exertion.  Denies history of atrial fibrillation or irregular heartbeat    Physical Examination  Vitals:   12/29/20 1107  BP: 133/65  Pulse: 83  Resp: 18  Temp: 98 F (36.7 C)  SpO2: 93%  Weight: 119 lb (54 kg)  Height: 5' 7.5" (1.715 m)    Body mass index is 18.36 kg/m.  General:  Alert and oriented, no acute distress HEENT: Normal Neck: No JVD Cardiac: Regular Rate and Rhythm vaginal irregular beat Abdomen: Soft, non-tender, non-distended, no mass, no scars Skin: No rash, large full-thickness ulceration right posterior calf approximately 4 x 4 cm with exposed muscle, gangrene tip of right fourth toe ulceration left lateral malleolus and over the left lateral tarsal bone approximately 2 cm diameter with some granulation  tissue Extremity Pulses:  2+ femoral, absent  popliteal dorsalis pedis, posterior tibial pulses bilaterally Musculoskeletal: No deformity or edema  Neurologic: Upper and lower extremity motor 5/5 and symmetric  DATA:  I reviewed the patient's recent arteriograms from October reflective of history of present illness.  She also had noninvasive exam December of this year which showed a toe pressure of 49 on the left side.  Toe pressure on the right side was not done.  She has calcified noncompressible vessels.  ASSESSMENT: Rapidly enlarging wound right lower extremity with gangrene tip of right fourth toe at risk for limb loss previous SFA and tibial interventions in the right leg.   PLAN: Aortogram lower extremity runoff by Dr. Donzetta Matters on Monday, January 03, 2020.  Risk benefits possible complications procedure details were discussed the patient today.  She understands agrees to proceed.  Ruta Hinds, MD [facility] Office: [phone]

## 2021-01-02 ENCOUNTER — Encounter (HOSPITAL_COMMUNITY): Payer: Self-pay | Admitting: Vascular Surgery

## 2021-01-02 ENCOUNTER — Other Ambulatory Visit: Payer: Self-pay

## 2021-01-02 ENCOUNTER — Encounter (HOSPITAL_BASED_OUTPATIENT_CLINIC_OR_DEPARTMENT_OTHER): Payer: Medicare Other | Admitting: Internal Medicine

## 2021-01-02 ENCOUNTER — Ambulatory Visit (HOSPITAL_COMMUNITY)
Admission: RE | Admit: 2021-01-02 | Discharge: 2021-01-02 | Disposition: A | Discharge status: Home / Self Care | Payer: Medicare Other | Attending: Vascular Surgery | Admitting: Vascular Surgery

## 2021-01-02 ENCOUNTER — Encounter (HOSPITAL_COMMUNITY)
Admission: RE | Disposition: A | Discharge status: Home / Self Care | Payer: Self-pay | Source: Home / Self Care | Attending: Vascular Surgery

## 2021-01-02 DIAGNOSIS — Z7902 Long term (current) use of antithrombotics/antiplatelets: Secondary | ICD-10-CM | POA: Diagnosis not present

## 2021-01-02 DIAGNOSIS — Z79890 Hormone replacement therapy (postmenopausal): Secondary | ICD-10-CM | POA: Diagnosis not present

## 2021-01-02 DIAGNOSIS — Z880 Allergy status to penicillin: Secondary | ICD-10-CM | POA: Diagnosis not present

## 2021-01-02 DIAGNOSIS — I70235 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 of foot: Secondary | ICD-10-CM | POA: Insufficient documentation

## 2021-01-02 DIAGNOSIS — Z79899 Other long term (current) drug therapy: Secondary | ICD-10-CM | POA: Insufficient documentation

## 2021-01-02 DIAGNOSIS — I70242 Atherosclerosis of native arteries of left leg with ulceration of calf: Secondary | ICD-10-CM | POA: Diagnosis not present

## 2021-01-02 DIAGNOSIS — Z882 Allergy status to sulfonamides status: Secondary | ICD-10-CM | POA: Insufficient documentation

## 2021-01-02 DIAGNOSIS — L97519 Non-pressure chronic ulcer of other part of right foot with unspecified severity: Secondary | ICD-10-CM | POA: Insufficient documentation

## 2021-01-02 DIAGNOSIS — I70261 Atherosclerosis of native arteries of extremities with gangrene, right leg: Secondary | ICD-10-CM | POA: Diagnosis not present

## 2021-01-02 DIAGNOSIS — I70232 Atherosclerosis of native arteries of right leg with ulceration of calf: Secondary | ICD-10-CM | POA: Diagnosis not present

## 2021-01-02 HISTORY — PX: ABDOMINAL AORTOGRAM W/LOWER EXTREMITY: CATH118223

## 2021-01-02 LAB — POCT I-STAT, CHEM 8
BUN: 30 mg/dL — ABNORMAL HIGH (ref 8–23)
Calcium, Ion: 1.18 mmol/L (ref 1.15–1.40)
Chloride: 97 mmol/L — ABNORMAL LOW (ref 98–111)
Creatinine, Ser: 1.5 mg/dL — ABNORMAL HIGH (ref 0.44–1.00)
Glucose, Bld: 91 mg/dL (ref 70–99)
HCT: 29 % — ABNORMAL LOW (ref 36.0–46.0)
Hemoglobin: 9.9 g/dL — ABNORMAL LOW (ref 12.0–15.0)
Potassium: 3.4 mmol/L — ABNORMAL LOW (ref 3.5–5.1)
Sodium: 137 mmol/L (ref 135–145)
TCO2: 29 mmol/L (ref 22–32)

## 2021-01-02 SURGERY — ABDOMINAL AORTOGRAM W/LOWER EXTREMITY
Anesthesia: LOCAL | Laterality: Bilateral

## 2021-01-02 MED ORDER — HEPARIN (PORCINE) IN NACL 1000-0.9 UT/500ML-% IV SOLN
INTRAVENOUS | Status: AC
  Filled 2021-01-02: qty 500

## 2021-01-02 MED ORDER — LABETALOL HCL 5 MG/ML IV SOLN
INTRAVENOUS | Status: DC | PRN
  Administered 2021-01-02: 10 mg via INTRAVENOUS

## 2021-01-02 MED ORDER — MIDAZOLAM HCL 2 MG/2ML IJ SOLN
INTRAMUSCULAR | Status: DC | PRN
  Administered 2021-01-02: 1 mg via INTRAVENOUS

## 2021-01-02 MED ORDER — LABETALOL HCL 5 MG/ML IV SOLN
INTRAVENOUS | Status: AC
  Filled 2021-01-02: qty 4

## 2021-01-02 MED ORDER — LIDOCAINE HCL (PF) 1 % IJ SOLN
INTRAMUSCULAR | Status: DC | PRN
  Administered 2021-01-02: 15 mL

## 2021-01-02 MED ORDER — FENTANYL CITRATE (PF) 100 MCG/2ML IJ SOLN
INTRAMUSCULAR | Status: DC | PRN
  Administered 2021-01-02: 50 ug via INTRAVENOUS

## 2021-01-02 MED ORDER — LIDOCAINE HCL (PF) 1 % IJ SOLN
INTRAMUSCULAR | Status: AC
  Filled 2021-01-02: qty 30

## 2021-01-02 MED ORDER — HYDRALAZINE HCL 20 MG/ML IJ SOLN: 5.0000 mg | INTRAMUSCULAR | Status: DC | PRN

## 2021-01-02 MED ORDER — HEPARIN (PORCINE) IN NACL 1000-0.9 UT/500ML-% IV SOLN
INTRAVENOUS | Status: DC | PRN
  Administered 2021-01-02 (×2): 500 mL

## 2021-01-02 MED ORDER — ACETAMINOPHEN 325 MG PO TABS
650.0000 mg | ORAL_TABLET | ORAL | Status: DC | PRN
  Administered 2021-01-02: 650 mg via ORAL
  Filled 2021-01-02: qty 2

## 2021-01-02 MED ORDER — SODIUM CHLORIDE 0.9 % IV SOLN: INTRAVENOUS | Status: DC

## 2021-01-02 MED ORDER — SODIUM CHLORIDE 0.9% FLUSH: 3.0000 mL | INTRAVENOUS | Status: DC | PRN

## 2021-01-02 MED ORDER — IODIXANOL 320 MG/ML IV SOLN
INTRAVENOUS | Status: DC | PRN
  Administered 2021-01-02: 90 mL

## 2021-01-02 MED ORDER — SODIUM CHLORIDE 0.9 % WEIGHT BASED INFUSION: 1.0000 mL/kg/h | INTRAVENOUS | Status: DC

## 2021-01-02 MED ORDER — FENTANYL CITRATE (PF) 100 MCG/2ML IJ SOLN
INTRAMUSCULAR | Status: AC
  Filled 2021-01-02: qty 2

## 2021-01-02 MED ORDER — SODIUM CHLORIDE 0.9% FLUSH: 3.0000 mL | Freq: Two times a day (BID) | INTRAVENOUS | Status: DC

## 2021-01-02 MED ORDER — MIDAZOLAM HCL 2 MG/2ML IJ SOLN
INTRAMUSCULAR | Status: AC
  Filled 2021-01-02: qty 2

## 2021-01-02 MED ORDER — SODIUM CHLORIDE 0.9 % IV SOLN: 250.0000 mL | INTRAVENOUS | Status: DC | PRN

## 2021-01-02 MED ORDER — LABETALOL HCL 5 MG/ML IV SOLN
10.0000 mg | INTRAVENOUS | Status: DC | PRN
Start: 2021-01-02 — End: 2021-01-02

## 2021-01-02 SURGICAL SUPPLY — 9 items
CATH OMNI FLUSH 5F 65CM (CATHETERS) ×2 IMPLANT
KIT MICROPUNCTURE NIT STIFF (SHEATH) ×2 IMPLANT
KIT PV (KITS) ×2 IMPLANT
SHEATH PINNACLE 5F 10CM (SHEATH) ×2 IMPLANT
SHEATH PROBE COVER 6X72 (BAG) ×2 IMPLANT
SYR MEDRAD MARK V 150ML (SYRINGE) ×2 IMPLANT
TRANSDUCER W/STOPCOCK (MISCELLANEOUS) ×2 IMPLANT
TRAY PV CATH (CUSTOM PROCEDURE TRAY) ×2 IMPLANT
WIRE BENTSON .035X145CM (WIRE) ×2 IMPLANT

## 2021-01-02 NOTE — Op Note (Signed)
Patient name: [name] [name] MRN: [id number] DOB: Feb 20, 1951 Sex: female  01/02/2021 Pre-operative Diagnosis: Bilateral lower extremity wounds, PAD Post-operative diagnosis:  Same Surgeon:  Eda Paschal. Donzetta Matters, MD Procedure Performed: 1.  Ultrasound-guided cannulation left common femoral artery 2.  Aortogram 3.  Selection of right common femoral artery and right lower extremity angiogram 4.  Left lower extremity angiogram 5.  Moderate sedation with fentanyl and Versed for 21 minutes   Indications: 70 year old female with previous bilateral lower extremity endovascular invention.  She now has persistent wounds on her bilateral legs and is indicated for angiography with possible intervention.  Findings: The aorta and iliac segments were calcified.  She appears to have bilateral common femoral treatments in the past.  The right side she is heavily calcified she has 1 area of approximately 50% stenosis in the SFA is nonflow limiting.  She has dominant runoff via the arterial tibial and peroneal arteries.  On the left side she also has heavily calcified SFA no flow-limiting stenosis in the popliteal artery.  She then has runoff via the anterior tibial and posterior tibial arteries without flow-limiting stenosis.  No intervention was undertaken.  Patient will need continue wound care   Procedure:  The patient was identified in the holding area and taken to room 8.  The patient was then placed supine on the table and prepped and draped in the usual sterile fashion.  A time out was called.  Ultrasound was used to evaluate the left common femoral artery.  The artery was noted to be patent.  To have a previous patch angioplasty but did have calcium higher up.  The area was anesthetized 1% lidocaine.  Moderate sedation was administered with fentanyl Versed and a nurse monitored her vital signs throughout the course of the case.  The artery was cannulated with direct ultrasound visualization a micropuncture  needle followed by wire sheath.  Images saved the permanent record.  Bentson wires placed followed by 5 [place] sheath.  Omni catheter was placed to the level of L1 and aortogram performed.  We then crossed the bifurcation to the common femoral artery on the right with Bentson wire and Omni catheter.  Wire was removed right lower extremity angiography performed with the above findings.  We then remove the catheter over wire.  Left lower extremity angiogram was performed via retrograde sheath angiogram.  Sheath will be pulled in postoperative holding.  She tolerated procedure without any complication.  Contrast: 90cc   [name] C. Donzetta Matters, MD [facility] [place] Office: [phone] Pager: [phone]

## 2021-01-02 NOTE — Interval H&P Note (Signed)
History and Physical Interval Note:  01/02/2021 12:35 PM  [name] [name]  has presented today for surgery, with the diagnosis of peripheral vascular disease.  The various methods of treatment have been discussed with the patient and family. After consideration of risks, benefits and other options for treatment, the patient has consented to  Procedure(s): ABDOMINAL AORTOGRAM W/LOWER EXTREMITY (N/A) as a surgical intervention.  The patient's history has been reviewed, patient examined, no change in status, stable for surgery.  I have reviewed the patient's chart and labs.  Questions were answered to the patient's satisfaction.     Servando Snare

## 2021-01-02 NOTE — Discharge Instructions (Signed)
Angiogram, Care After This sheet gives you information about how to care for yourself after your procedure. Your health care provider may also give you more specific instructions. If you have problems or questions, contact your health care provider. What can I expect after the procedure? After the procedure, it is common to have bruising and tenderness at the catheter insertion area. Follow these instructions at home: Insertion site care  Follow instructions from your health care provider about how to take care of your insertion site. Make sure you: ? Wash your hands with soap and water before you change your bandage (dressing). If soap and water are not available, use hand sanitizer. ? Change your dressing as told by your health care provider. ? Leave stitches (sutures), skin glue, or adhesive strips in place. These skin closures may need to stay in place for 2 weeks or longer. If adhesive strip edges start to loosen and curl up, you may trim the loose edges. Do not remove adhesive strips completely unless your health care provider tells you to do that.  Do not take baths, swim, or use a hot tub until your health care provider approves.  You may shower 24-48 hours after the procedure or as told by your health care provider. ? Gently wash the site with plain soap and water. ? Pat the area dry with a clean towel. ? Do not rub the site. This may cause bleeding.  Do not apply powder or lotion to the site. Keep the site clean and dry.  Check your insertion site every day for signs of infection. Check for: ? Redness, swelling, or pain. ? Fluid or blood. ? Warmth. ? Pus or a bad smell. Activity  Rest as told by your health care provider, usually for 1-2 days.  Do not lift anything that is heavier than 10 lbs. (4.5 kg) or as told by your health care provider.  Do not drive for 24 hours if you were given a medicine to help you relax (sedative).  Do not drive or use heavy machinery while  taking prescription pain medicine. General instructions   Return to your normal activities as told by your health care provider, usually in about a week. Ask your health care provider what activities are safe for you.  If the catheter site starts bleeding, lie flat and put pressure on the site. If the bleeding does not stop, get help right away. This is a medical emergency.  Drink enough fluid to keep your urine clear or pale yellow. This helps flush the contrast dye from your body.  Take over-the-counter and prescription medicines only as told by your health care provider.  Keep all follow-up visits as told by your health care provider. This is important. Contact a health care provider if:  You have a fever or chills.  You have redness, swelling, or pain around your insertion site.  You have fluid or blood coming from your insertion site.  The insertion site feels warm to the touch.  You have pus or a bad smell coming from your insertion site.  You have bruising around the insertion site.  You notice blood collecting in the tissue around the catheter site (hematoma). The hematoma may be painful to the touch. Get help right away if:  You have severe pain at the catheter insertion area.  The catheter insertion area swells very fast.  The catheter insertion area is bleeding, and the bleeding does not stop when you hold steady pressure on the area.  The area near or just beyond the catheter insertion site becomes pale, cool, tingly, or numb. These symptoms may represent a serious problem that is an emergency. Do not wait to see if the symptoms will go away. Get medical help right away. Call your local emergency services (911 in the [place] Do not drive yourself to the hospital. Summary  After the procedure, it is common to have bruising and tenderness at the catheter insertion area.  After the procedure, it is important to rest and drink plenty of fluids.  Do not take baths,  swim, or use a hot tub until your health care provider says it is okay to do so. You may shower 24-48 hours after the procedure or as told by your health care provider.  If the catheter site starts bleeding, lie flat and put pressure on the site. If the bleeding does not stop, get help right away. This is a medical emergency. This information is not intended to replace advice given to you by your health care provider. Make sure you discuss any questions you have with your health care provider. Document Revised: 11/29/2017 Document Reviewed: 11/21/2016 Elsevier Patient Education  2020 [employer].

## 2021-01-02 NOTE — Progress Notes (Addendum)
Sheath removed from L groin. Manual  pressure held x 15 mins. Level 0. Dressing applied, clean, dry and intact. Bedrest x4 hrs beginning now. Pt denies complaints. Will continue to monitor. Jerald Kief, RN

## 2021-01-04 ENCOUNTER — Other Ambulatory Visit (HOSPITAL_COMMUNITY): Payer: Self-pay | Admitting: Internal Medicine

## 2021-01-04 ENCOUNTER — Other Ambulatory Visit (HOSPITAL_COMMUNITY): Payer: Self-pay | Admitting: Cardiology

## 2021-01-04 ENCOUNTER — Other Ambulatory Visit: Payer: Self-pay | Admitting: Internal Medicine

## 2021-01-08 ENCOUNTER — Other Ambulatory Visit (HOSPITAL_COMMUNITY): Payer: Self-pay | Admitting: Internal Medicine

## 2021-01-09 ENCOUNTER — Encounter (HOSPITAL_BASED_OUTPATIENT_CLINIC_OR_DEPARTMENT_OTHER): Payer: Medicare Other | Attending: Internal Medicine | Admitting: Internal Medicine

## 2021-01-09 ENCOUNTER — Other Ambulatory Visit: Payer: Self-pay

## 2021-01-09 DIAGNOSIS — L97322 Non-pressure chronic ulcer of left ankle with fat layer exposed: Secondary | ICD-10-CM | POA: Diagnosis not present

## 2021-01-09 DIAGNOSIS — Z86718 Personal history of other venous thrombosis and embolism: Secondary | ICD-10-CM | POA: Insufficient documentation

## 2021-01-09 DIAGNOSIS — L97522 Non-pressure chronic ulcer of other part of left foot with fat layer exposed: Secondary | ICD-10-CM | POA: Diagnosis not present

## 2021-01-09 DIAGNOSIS — L97811 Non-pressure chronic ulcer of other part of right lower leg limited to breakdown of skin: Secondary | ICD-10-CM | POA: Insufficient documentation

## 2021-01-09 DIAGNOSIS — L89616 Pressure-induced deep tissue damage of right heel: Secondary | ICD-10-CM | POA: Diagnosis not present

## 2021-01-09 DIAGNOSIS — I87331 Chronic venous hypertension (idiopathic) with ulcer and inflammation of right lower extremity: Secondary | ICD-10-CM | POA: Insufficient documentation

## 2021-01-09 DIAGNOSIS — L97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: Secondary | ICD-10-CM | POA: Insufficient documentation

## 2021-01-09 DIAGNOSIS — I70248 Atherosclerosis of native arteries of left leg with ulceration of other part of lower left leg: Secondary | ICD-10-CM | POA: Diagnosis not present

## 2021-01-09 DIAGNOSIS — L97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: Secondary | ICD-10-CM | POA: Insufficient documentation

## 2021-01-09 DIAGNOSIS — I83222 Varicose veins of left lower extremity with both ulcer of calf and inflammation: Secondary | ICD-10-CM | POA: Diagnosis not present

## 2021-01-09 DIAGNOSIS — L97528 Non-pressure chronic ulcer of other part of left foot with other specified severity: Secondary | ICD-10-CM | POA: Diagnosis not present

## 2021-01-09 DIAGNOSIS — I872 Venous insufficiency (chronic) (peripheral): Secondary | ICD-10-CM | POA: Diagnosis not present

## 2021-01-09 DIAGNOSIS — I70243 Atherosclerosis of native arteries of left leg with ulceration of ankle: Secondary | ICD-10-CM | POA: Diagnosis not present

## 2021-01-09 DIAGNOSIS — I70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 lower right leg: Secondary | ICD-10-CM | POA: Diagnosis not present

## 2021-01-10 ENCOUNTER — Other Ambulatory Visit (INDEPENDENT_AMBULATORY_CARE_PROVIDER_SITE_OTHER): Payer: Medicare Other | Admitting: Internal Medicine

## 2021-01-10 ENCOUNTER — Other Ambulatory Visit: Payer: Self-pay

## 2021-01-10 DIAGNOSIS — M810 Age-related osteoporosis without current pathological fracture: Secondary | ICD-10-CM | POA: Diagnosis not present

## 2021-01-10 DIAGNOSIS — N1831 Chronic kidney disease, stage 3a: Secondary | ICD-10-CM

## 2021-01-10 DIAGNOSIS — D649 Anemia, unspecified: Secondary | ICD-10-CM | POA: Diagnosis not present

## 2021-01-10 DIAGNOSIS — Z0000 Encounter for general adult medical examination without abnormal findings: Secondary | ICD-10-CM

## 2021-01-10 DIAGNOSIS — Z8659 Personal history of other mental and behavioral disorders: Secondary | ICD-10-CM

## 2021-01-10 DIAGNOSIS — M349 Systemic sclerosis, unspecified: Secondary | ICD-10-CM

## 2021-01-10 DIAGNOSIS — E039 Hypothyroidism, unspecified: Secondary | ICD-10-CM

## 2021-01-10 DIAGNOSIS — M341 CR(E)ST syndrome: Secondary | ICD-10-CM | POA: Diagnosis not present

## 2021-01-10 DIAGNOSIS — D508 Other iron deficiency anemias: Secondary | ICD-10-CM

## 2021-01-10 DIAGNOSIS — I10 Essential (primary) hypertension: Secondary | ICD-10-CM | POA: Diagnosis not present

## 2021-01-10 DIAGNOSIS — E782 Mixed hyperlipidemia: Secondary | ICD-10-CM | POA: Diagnosis not present

## 2021-01-10 DIAGNOSIS — Z9981 Dependence on supplemental oxygen: Secondary | ICD-10-CM

## 2021-01-10 DIAGNOSIS — H903 Sensorineural hearing loss, bilateral: Secondary | ICD-10-CM | POA: Diagnosis not present

## 2021-01-10 DIAGNOSIS — T148XXA Other injury of unspecified body region, initial encounter: Secondary | ICD-10-CM | POA: Diagnosis not present

## 2021-01-10 DIAGNOSIS — I2729 Other secondary pulmonary hypertension: Secondary | ICD-10-CM

## 2021-01-10 DIAGNOSIS — S3282XD Multiple fractures of pelvis without disruption of pelvic ring, subsequent encounter for fracture with routine healing: Secondary | ICD-10-CM

## 2021-01-10 LAB — POCT URINALYSIS DIPSTICK
Appearance: NEGATIVE
Bilirubin, UA: NEGATIVE
Blood, UA: NEGATIVE
Glucose, UA: NEGATIVE
Ketones, UA: NEGATIVE
Leukocytes, UA: NEGATIVE
Nitrite, UA: NEGATIVE
Odor: NEGATIVE
Protein, UA: NEGATIVE
Spec Grav, UA: 1.015 (ref 1.010–1.025)
Urobilinogen, UA: 0.2 E.U./dL
pH, UA: 6.5 (ref 5.0–8.0)

## 2021-01-10 NOTE — Progress Notes (Signed)
[name] [name], [name] [name] ([id number]) Visit Report for 01/09/2021 Arrival Information Details Patient Name: Date of Service: [name] 01/09/2021 11:00 A M Medical Record Number: [id number] Patient Account Number: [id number] Date of Birth/Sex: Treating RN: [birth year] (70 y.o. [name] [name], [name] [name] Primary Care Alvaretta Eisenberger: [name] Other Clinician: Referring Akif Weldy: Treating Zlaty Alexa/Extender: [name] in Treatment: 65 Visit Information History Since Last Visit Added or deleted any medications: No Patient Arrived: Wheel Chair Any new allergies or adverse reactions: No Arrival Time: 11:26 Had a fall or experienced change in No Accompanied By: spouse activities of daily living that may affect Transfer Assistance: None risk of falls: Patient Identification Verified: Yes Signs or symptoms of abuse/neglect since last visito No Secondary Verification Process Completed: Yes Hospitalized since last visit: No Patient Requires Transmission-Based Precautions: No Implantable device outside of the clinic excluding No Patient Has Alerts: Yes cellular tissue based products placed in the center Patient Alerts: R ABI: [place] TBI: 0.41 since last visit: L ABI: [place] (07/2020) Has Dressing in Place as Prescribed: Yes Has Compression in Place as Prescribed: Yes Pain Present Now: Yes Electronic Signature(s) Signed: 01/10/2021 6:04:09 PM By: Baruch Gouty RN, BSN Entered By: Baruch Gouty on 01/09/2021 11:32:30 -------------------------------------------------------------------------------- Compression Therapy Details Patient Name: Date of Service: [name] 01/09/2021 11:00 A M Medical Record Number: [id number] Patient Account Number: [id number] Date of Birth/Sex: Treating RN: [birth year] (70 y.o. [name] [name] Primary Care Kol Consuegra: [name] Other Clinician: Referring Ireoluwa Gorsline: Treating Quinita Kostelecky/Extender: [name] in  Treatment: 459 Compression Therapy Performed for Wound Assessment: Wound #19 Right,Lateral Malleolus Performed By: Clinician Levan Hurst, RN Compression Type: Three Layer Post Procedure Diagnosis Same as Pre-procedure Electronic Signature(s) Signed: 01/09/2021 5:04:19 PM By: Levan Hurst RN, BSN Entered By: Levan Hurst on 01/09/2021 13:06:41 -------------------------------------------------------------------------------- Compression Therapy Details Patient Name: Date of Service: [name] 01/09/2021 11:00 A M Medical Record Number: [id number] Patient Account Number: [id number] Date of Birth/Sex: Treating RN: 1951-12-10 (70 y.o. [name] [name] Primary Care Brandley Aldrete: [name] Other Clinician: Referring Arben Packman: Treating Latressa Harries/Extender: [name] in Treatment: 459 Compression Therapy Performed for Wound Assessment: Wound #16 Right Calcaneus Performed By: Clinician Levan Hurst, RN Compression Type: Three Layer Post Procedure Diagnosis Same as Pre-procedure Electronic Signature(s) Signed: 01/09/2021 5:04:19 PM By: Levan Hurst RN, BSN Entered By: Levan Hurst on 01/09/2021 13:06:42 -------------------------------------------------------------------------------- Compression Therapy Details Patient Name: Date of Service: [name] 01/09/2021 11:00 A M Medical Record Number: [id number] Patient Account Number: [id number] Date of Birth/Sex: Treating RN: 11-May-1951 (70 y.o. [name] [name] Primary Care Samantha Olivera: [name] Other Clinician: Referring Nazaiah Navarrete: Treating Nazire Fruth/Extender: [name] in Treatment: 459 Compression Therapy Performed for Wound Assessment: Wound #20 Left,Lateral Malleolus Performed By: Clinician Levan Hurst, RN Compression Type: Three Layer Post Procedure Diagnosis Same as Pre-procedure Electronic Signature(s) Signed: 01/09/2021 5:04:19 PM By: Levan Hurst RN, BSN Entered By: Levan Hurst on 01/09/2021 13:06:42 -------------------------------------------------------------------------------- Compression Therapy Details Patient Name: Date of Service: [name] 01/09/2021 11:00 A M Medical Record Number: [id number] Patient Account Number: [id number] Date of Birth/Sex: Treating RN: 1951/08/02 (70 y.o. [name] [name] Primary Care Jlynn Ly: [name] Other Clinician: Referring Lamiah Marmol: Treating Fairy Ashlock/Extender: [name] in Treatment: 459 Compression Therapy Performed for Wound Assessment: Wound #23 Right,Posterior Lower Leg Performed By: Clinician Levan Hurst, RN Compression Type: Three Layer  Post Procedure Diagnosis Same as Pre-procedure Electronic Signature(s) Signed: 01/09/2021 5:04:19 PM By: Levan Hurst RN, BSN Entered By: Levan Hurst on 01/09/2021 13:06:43 -------------------------------------------------------------------------------- Compression Therapy Details Patient Name: Date of Service: [name] 01/09/2021 11:00 A M Medical Record Number: [id number] Patient Account Number: [id number] Date of Birth/Sex: Treating RN: 05/06/1951 (70 y.o. [name] [name] Primary Care Lysbeth Dicola: [name] Other Clinician: Referring Enyah Moman: Treating Brandyce Dimario/Extender: [name] in Treatment: 459 Compression Therapy Performed for Wound Assessment: Wound #24 Right,Anterior Lower Leg Performed By: Clinician Levan Hurst, RN Compression Type: Three Layer Post Procedure Diagnosis Same as Pre-procedure Electronic Signature(s) Signed: 01/09/2021 5:04:19 PM By: Levan Hurst RN, BSN Entered By: Levan Hurst on 01/09/2021 13:06:43 -------------------------------------------------------------------------------- Compression Therapy Details Patient Name: Date of Service: [name] 01/09/2021 11:00 A M Medical Record Number:  [id number] Patient Account Number: [id number] Date of Birth/Sex: Treating RN: [birth year] (70 y.o. [name] [name] Primary Care Darriel Sinquefield: [name] Other Clinician: Referring Star Resler: Treating Yashira Offenberger/Extender: [name] in Treatment: 459 Compression Therapy Performed for Wound Assessment: Wound #25 Left,Lateral Foot Performed By: Clinician Levan Hurst, RN Compression Type: Three Layer Post Procedure Diagnosis Same as Pre-procedure Electronic Signature(s) Signed: 01/09/2021 5:04:19 PM By: Levan Hurst RN, BSN Entered By: Levan Hurst on 01/09/2021 13:06:44 -------------------------------------------------------------------------------- Compression Therapy Details Patient Name: Date of Service: [name] 01/09/2021 11:00 A M Medical Record Number: [id number] Patient Account Number: [id number] Date of Birth/Sex: Treating RN: 04-23-1951 (70 y.o. [name] [name] Primary Care Teauna Dubach: Other Clinician: [name] Referring Dynver Clemson: Treating Bracy Pepper/Extender: [name] in Treatment: 459 Compression Therapy Performed for Wound Assessment: Wound #26 Right,Lateral Lower Leg Performed By: Clinician Levan Hurst, RN Compression Type: Three Layer Post Procedure Diagnosis Same as Pre-procedure Electronic Signature(s) Signed: 01/09/2021 5:04:19 PM By: Levan Hurst RN, BSN Entered By: Levan Hurst on 01/09/2021 13:06:44 -------------------------------------------------------------------------------- Compression Therapy Details Patient Name: Date of Service: [name] 01/09/2021 11:00 A M Medical Record Number: [id number] Patient Account Number: [id number] Date of Birth/Sex: Treating RN: 1951-07-08 (70 y.o. [name] [name] Primary Care Katiejo Gilroy: [name] Other Clinician: Referring Maggie Senseney: Treating Mercer Stallworth/Extender: [name] in Treatment: 459 Compression  Therapy Performed for Wound Assessment: Wound #27 Right,Distal,Anterior Lower Leg Performed By: Clinician Levan Hurst, RN Compression Type: Three Layer Post Procedure Diagnosis Same as Pre-procedure Electronic Signature(s) Signed: 01/09/2021 5:04:19 PM By: Levan Hurst RN, BSN Entered By: Levan Hurst on 01/09/2021 13:06:45 -------------------------------------------------------------------------------- Compression Therapy Details Patient Name: Date of Service: [name] 01/09/2021 11:00 A M Medical Record Number: [id number] Patient Account Number: [id number] Date of Birth/Sex: Treating RN: August 28, 1951 (70 y.o. [name] [name] Primary Care Ioannis Schuh: [name] Other Clinician: Referring Siyah Mault: Treating Terrel Nesheiwat/Extender: [name] in Treatment: 459 Compression Therapy Performed for Wound Assessment: Wound #28 Left,Lateral Lower Leg Performed By: Clinician Levan Hurst, RN Compression Type: Three Layer Post Procedure Diagnosis Same as Pre-procedure Electronic Signature(s) Signed: 01/09/2021 5:04:19 PM By: Levan Hurst RN, BSN Entered By: Levan Hurst on 01/09/2021 13:06:45 -------------------------------------------------------------------------------- Compression Therapy Details Patient Name: Date of Service: [name] 01/09/2021 11:00 A M Medical Record Number: [id number] Patient Account Number: [id number] Date of Birth/Sex: Treating RN: 11-18-1951 (70 y.o. [name] [name] Primary Care Tiziana Cislo: [name] Other Clinician: Referring Fantasy Donald: Treating Ryen Heitmeyer/Extender: [name] in Treatment: 459 Compression Therapy  Performed for Wound Assessment: Wound #29 Right,Proximal,Lateral Lower Leg Performed By: Clinician Levan Hurst, RN Compression Type: Three Layer Post Procedure Diagnosis Same as Pre-procedure Electronic Signature(s) Signed: 01/09/2021 5:04:19 PM By: Levan Hurst  RN, BSN Entered By: Levan Hurst on 01/09/2021 13:06:46 -------------------------------------------------------------------------------- Compression Therapy Details Patient Name: Date of Service: [name] 01/09/2021 11:00 A M Medical Record Number: [id number] Patient Account Number: [id number] Date of Birth/Sex: Treating RN: 01/02/1951 (70 y.o. [name] [name] Primary Care Tyrees Chopin: [name] Other Clinician: Referring Duana Benedict: Treating Mattea Seger/Extender: [name] in Treatment: 459 Compression Therapy Performed for Wound Assessment: Wound #31 Left,Medial Ankle Performed By: Clinician Levan Hurst, RN Compression Type: Three Layer Post Procedure Diagnosis Same as Pre-procedure Electronic Signature(s) Signed: 01/09/2021 5:04:19 PM By: Levan Hurst RN, BSN Entered By: Levan Hurst on 01/09/2021 13:06:46 -------------------------------------------------------------------------------- Compression Therapy Details Patient Name: Date of Service: [name] 01/09/2021 11:00 A M Medical Record Number: [id number] Patient Account Number: [id number] Date of Birth/Sex: Treating RN: Nov 17, 1951 (70 y.o. [name] [name] Primary Care Bernarda Erck: [name] Other Clinician: Referring Ramonte Mena: Treating Daneli Butkiewicz/Extender: [name] in Treatment: 459 Compression Therapy Performed for Wound Assessment: Wound #5 Right,Medial Lower Leg Performed By: Clinician Levan Hurst, RN Compression Type: Three Layer Post Procedure Diagnosis Same as Pre-procedure Electronic Signature(s) Signed: 01/09/2021 5:04:19 PM By: Levan Hurst RN, BSN Entered By: Levan Hurst on 01/09/2021 13:06:47 -------------------------------------------------------------------------------- Encounter Discharge Information Details Patient Name: Date of Service: [name], [name] 01/09/2021 11:00 A M Medical Record Number:  [id number] Patient Account Number: [id number] Date of Birth/Sex: Treating RN: 10/02/1951 (70 y.o. [name] [name] Primary Care Thomos Domine: [name] Other Clinician: Referring Winston Sobczyk: Treating Aziah Kaiser/Extender: [name] in Treatment: [place] Encounter Discharge Information Items Post Procedure Vitals Discharge Condition: Stable Temperature (F): 98.3 Ambulatory Status: Wheelchair Pulse (bpm): 98 Discharge Destination: Home Respiratory Rate (breaths/min): 18 Transportation: Private Auto Blood Pressure (mmHg): 166/76 Accompanied By: spouse Schedule Follow-up Appointment: Yes Clinical Summary of Care: Patient Declined Electronic Signature(s) Signed: 01/10/2021 6:04:09 PM By: Baruch Gouty RN, BSN Entered By: Baruch Gouty on 01/09/2021 14:15:08 -------------------------------------------------------------------------------- Lower Extremity Assessment Details Patient Name: Date of Service: [name], [name] 01/09/2021 11:00 A M Medical Record Number: [id number] Patient Account Number: [id number] Date of Birth/Sex: Treating RN: 1951-07-12 (70 y.o. [name] [name] Primary Care Juliett Eastburn: [name] Other Clinician: Referring Anacarolina Evelyn: Treating Kassidi Elza/Extender: [name] in Treatment: 459 Edema Assessment Assessed: [Left: No] [Right: No] Edema: [Left: Yes] [Right: Yes] Calf Left: Right: Point of Measurement: 36 cm From Medial Instep 32 cm 35.5 cm Ankle Left: Right: Point of Measurement: 10 cm From Medial Instep 22 cm 21.2 cm Vascular Assessment Pulses: Dorsalis Pedis Palpable: [Left:Yes] [Right:Yes] Electronic Signature(s) Signed: 01/10/2021 6:04:09 PM By: Baruch Gouty RN, BSN Entered By: Baruch Gouty on 01/09/2021 12:09:32 -------------------------------------------------------------------------------- [place] Details Patient Name: Date of Service: [name] 01/09/2021  11:00 A M Medical Record Number: [id number] Patient Account Number: [id number] Date of Birth/Sex: Treating RN: 06/01/1951 (70 y.o. [name] [name] Primary Care Tameem Pullara: [name] Other Clinician: Referring Roman Sandall: Treating Quasim Doyon/Extender: [name] in Treatment: 459 Active Inactive Wound/Skin Impairment Nursing Diagnoses: Impaired tissue integrity Knowledge deficit related to ulceration/compromised skin integrity Goals: Patient/caregiver will verbalize understanding of skin care regimen Date Initiated: 10/11/2016 Target Resolution Date: 02/10/2021 Goal Status: Active Interventions: Assess patient/caregiver ability to  obtain necessary supplies Assess patient/caregiver ability to perform ulcer/skin care regimen upon admission and as needed Assess ulceration(s) every visit Provide education on ulcer and skin care Treatment Activities: Skin care regimen initiated : 10/11/2016 Topical wound management initiated : 10/11/2016 Notes: Electronic Signature(s) Signed: 01/09/2021 5:04:19 PM By: Levan Hurst RN, BSN Entered By: Levan Hurst on 01/09/2021 16:26:46 -------------------------------------------------------------------------------- Pain Assessment Details Patient Name: Date of Service: [name] 01/09/2021 11:00 A M Medical Record Number: [id number] Patient Account Number: [id number] Date of Birth/Sex: Treating RN: 09/12/1951 (70 y.o. [name] [name] Primary Care Wilmetta Speiser: [name] Other Clinician: Referring Savannha Welle: Treating Gaylia Kassel/Extender: [name] in Treatment: 459 Active Problems Location of Pain Severity and Description of Pain Patient Has Paino Yes Site Locations Pain Location: Pain Location: Pain in Ulcers With Dressing Change: Yes Duration of the Pain. Constant / Intermittento Constant Rate the pain. Current Pain Level: 7 Worst Pain Level: 10 Least Pain Level:  2 Tolerable Pain Level: 3 Character of Pain Describe the Pain: Burning, Throbbing Pain Management and Medication Current Pain Management: Medication: Yes Is the Current Pain Management Adequate: Adequate How does your wound impact your activities of daily livingo Sleep: Yes Bathing: No Appetite: No Relationship With Others: No Bladder Continence: No Emotions: No Bowel Continence: No Hobbies: Yes Toileting: No Dressing: No Electronic Signature(s) Signed: 01/10/2021 6:04:09 PM By: Baruch Gouty RN, BSN Entered By: Baruch Gouty on 01/09/2021 11:37:19 -------------------------------------------------------------------------------- Patient/Caregiver Education Details Patient Name: Date of Service: [name] 1/10/2022andnbsp11:00 A M Medical Record Number: [id number] Patient Account Number: [id number] Date of Birth/Gender: Treating RN: 01/03/1951 (70 y.o. [name] [name] Primary Care Physician: [name] Other Clinician: Referring Physician: Treating Physician/Extender: [name] in Treatment: 459 Education Assessment Education Provided To: Patient Education Topics Provided Wound/Skin Impairment: Methods: Explain/Verbal Responses: State content correctly [employer]) Signed: 01/09/2021 5:04:19 PM By: Levan Hurst RN, BSN Entered By: Levan Hurst on 01/09/2021 16:27:53 -------------------------------------------------------------------------------- Wound Assessment Details Patient Name: Date of Service: [name] 01/09/2021 11:00 A M Medical Record Number: [id number] Patient Account Number: [id number] Date of Birth/Sex: Treating RN: 1951-04-24 (70 y.o. [name] [name], [name] [name] Primary Care Sevin Farone: [name] Other Clinician: Referring Janita Camberos: Treating Milo Solana/Extender: [name] in Treatment: [place] Wound Status Wound Number: 16 Primary Pressure Ulcer Etiology: Wound  Location: Right Calcaneus Wound Open Wounding Event: Pressure Injury Status: Date Acquired: 01/04/2020 Comorbid Anemia, Hypertension, Peripheral Arterial Disease, Peripheral Weeks Of Treatment: 53 History: Venous Disease, Raynauds, Scleroderma, Rheumatoid Arthritis, Clustered Wound: No Osteoarthritis Wound Measurements Length: (cm) 0.5 Width: (cm) 1 Depth: (cm) 0.3 Area: (cm) 0.393 Volume: (cm) 0.118 % Reduction in Area: 96.9% % Reduction in Volume: 90.6% Epithelialization: Small (1-33%) Tunneling: No Undermining: No Wound Description Classification: Category/Stage III Wound Margin: Epibole Exudate Amount: Small Exudate Type: Serous Exudate Color: amber Foul Odor After Cleansing: No Slough/Fibrino Yes Wound Bed Granulation Amount: Small (1-33%) Exposed Structure Granulation Quality: Pink, Pale Fascia Exposed: No Necrotic Amount: Large (67-100%) Fat Layer (Subcutaneous Tissue) Exposed: Yes Necrotic Quality: Adherent Slough Tendon Exposed: No Muscle Exposed: No Joint Exposed: No Bone Exposed: No Treatment Notes Wound #16 (Calcaneus) Wound Laterality: Right Cleanser Soap and Water Discharge Instruction: May shower and wash wound with dial antibacterial soap and water prior to dressing change. Peri-Wound Care Sween Lotion (Moisturizing lotion) Discharge Instruction: Apply moisturizing lotion as directed Topical Primary Dressing Cutimed Sorbact Swab Discharge Instruction: Apply to wound bed as instructed  Secondary Dressing Woven Gauze Sponge, Non-Sterile 4x4 in Discharge Instruction: Apply over primary dressing as directed. ABD Pad, 8x10 Discharge Instruction: Apply over primary dressing as directed. Secured With Compression Wrap ThreePress (3 layer compression wrap) Discharge Instruction: Apply three layer compression as directed. Compression Stockings Add-Ons Electronic Signature(s) Signed: 01/10/2021 6:04:09 PM By: Baruch Gouty RN, BSN Entered By:  Baruch Gouty on 01/09/2021 11:54:06 -------------------------------------------------------------------------------- Wound Assessment Details Patient Name: Date of Service: [name], [name] 01/09/2021 11:00 A M Medical Record Number: [id number] Patient Account Number: [id number] Date of Birth/Sex: Treating RN: 17-Nov-1951 (70 y.o. [name] [name], [name] [name] Primary Care Nazim Kadlec: [name] Other Clinician: Referring Dondi Aime: Treating Abdullah Rizzi/Extender: [name] in Treatment: [place] Wound Status Wound Number: 19 Primary Venous Leg Ulcer Etiology: Wound Location: Right, Lateral Malleolus Wound Open Wounding Event: Gradually Appeared Status: Date Acquired: 05/02/2020 Comorbid Anemia, Hypertension, Peripheral Arterial Disease, Peripheral Weeks Of Treatment: 36 History: Venous Disease, Raynauds, Scleroderma, Rheumatoid Arthritis, Clustered Wound: No Osteoarthritis Wound Measurements Length: (cm) 2.2 Width: (cm) 0.6 Depth: (cm) 0.2 Area: (cm) 1.037 Volume: (cm) 0.207 % Reduction in Area: -635.5% % Reduction in Volume: -639.3% Epithelialization: Small (1-33%) Tunneling: No Undermining: No Wound Description Classification: Full Thickness Without Exposed Support Structures Wound Margin: Distinct, outline attached Exudate Amount: Small Exudate Type: Serous Exudate Color: amber Foul Odor After Cleansing: No Slough/Fibrino Yes Wound Bed Granulation Amount: Small (1-33%) Exposed Structure Granulation Quality: Pink Fascia Exposed: No Necrotic Amount: Large (67-100%) Fat Layer (Subcutaneous Tissue) Exposed: Yes Necrotic Quality: Adherent Slough Tendon Exposed: No Muscle Exposed: No Joint Exposed: No Bone Exposed: No Treatment Notes Wound #19 (Malleolus) Wound Laterality: Right, Lateral Cleanser Soap and Water Discharge Instruction: May shower and wash wound with dial antibacterial soap and water prior to dressing change. Peri-Wound Care Sween  Lotion (Moisturizing lotion) Discharge Instruction: Apply moisturizing lotion as directed Topical Primary Dressing Cutimed Sorbact Swab Discharge Instruction: Apply to wound bed as instructed Secondary Dressing Woven Gauze Sponge, Non-Sterile 4x4 in Discharge Instruction: Apply over primary dressing as directed. ABD Pad, 8x10 Discharge Instruction: Apply over primary dressing as directed. Secured With Compression Wrap ThreePress (3 layer compression wrap) Discharge Instruction: Apply three layer compression as directed. Compression Stockings Add-Ons Electronic Signature(s) Signed: 01/10/2021 6:04:09 PM By: Baruch Gouty RN, BSN Entered By: Baruch Gouty on 01/09/2021 11:55:20 -------------------------------------------------------------------------------- Wound Assessment Details Patient Name: Date of Service: [name], [name] 01/09/2021 11:00 A M Medical Record Number: [id number] Patient Account Number: [id number] Date of Birth/Sex: Treating RN: February 01, 1951 (70 y.o. [name] [name], [name] [name] Primary Care Nikia Mangino: [name] Other Clinician: Referring Joselyne Spake: Treating Enzley Kitchens/Extender: [name] in Treatment: [place] Wound Status Wound Number: 20 Primary Arterial Insufficiency Ulcer Etiology: Wound Location: Left, Lateral Malleolus Wound Open Wounding Event: Gradually Appeared Status: Date Acquired: 05/02/2020 Comorbid Anemia, Hypertension, Peripheral Arterial Disease, Peripheral Weeks Of Treatment: 36 History: Venous Disease, Raynauds, Scleroderma, Rheumatoid Arthritis, Clustered Wound: No Osteoarthritis Wound Measurements Length: (cm) 0.9 Width: (cm) 0.8 Depth: (cm) 0.2 Area: (cm) 0.565 Volume: (cm) 0.113 % Reduction in Area: -28.4% % Reduction in Volume: -28.4% Epithelialization: Small (1-33%) Tunneling: No Undermining: No Wound Description Classification: Full Thickness Without Exposed Support Structures Wound Margin: Flat and  Intact Exudate Amount: Small Exudate Type: Serous Exudate Color: amber Foul Odor After Cleansing: No Slough/Fibrino Yes Wound Bed Granulation Amount: None Present (0%) Exposed Structure Necrotic Amount: Large (67-100%) Fascia Exposed: No Necrotic Quality: Adherent Slough Fat Layer (Subcutaneous Tissue) Exposed: Yes Tendon Exposed: No Muscle Exposed: No  Joint Exposed: No Bone Exposed: No Treatment Notes Wound #20 (Malleolus) Wound Laterality: Left, Lateral Cleanser Soap and Water Discharge Instruction: May shower and wash wound with dial antibacterial soap and water prior to dressing change. Peri-Wound Care Sween Lotion (Moisturizing lotion) Discharge Instruction: Apply moisturizing lotion as directed Topical Primary Dressing Cutimed Sorbact Swab Discharge Instruction: Apply to wound bed as instructed Secondary Dressing Woven Gauze Sponge, Non-Sterile 4x4 in Discharge Instruction: Apply over primary dressing as directed. ABD Pad, 8x10 Discharge Instruction: Apply over primary dressing as directed. Secured With Compression Wrap ThreePress (3 layer compression wrap) Discharge Instruction: Apply three layer compression as directed. Compression Stockings Add-Ons Electronic Signature(s) Signed: 01/10/2021 6:04:09 PM By: Baruch Gouty RN, BSN Entered By: Baruch Gouty on 01/09/2021 11:55:58 -------------------------------------------------------------------------------- Wound Assessment Details Patient Name: Date of Service: [name], [name] 01/09/2021 11:00 A M Medical Record Number: [id number] Patient Account Number: [id number] Date of Birth/Sex: Treating RN: 1951-11-07 (70 y.o. [name] [name], [name] [name] Primary Care Kestrel Mis: [name] Other Clinician: Referring Edge Mauger: Treating Romell Wolden/Extender: [name] in Treatment: 459 Wound Status Wound Number: 23 Primary Venous Leg Ulcer Etiology: Wound Location: Right, Posterior Lower  Leg Wound Open Wounding Event: Gradually Appeared Status: Date Acquired: 08/15/2020 Comorbid Anemia, Hypertension, Peripheral Arterial Disease, Peripheral Weeks Of Treatment: 21 History: Venous Disease, Raynauds, Scleroderma, Rheumatoid Arthritis, Clustered Wound: Yes Osteoarthritis Wound Measurements Length: (cm) 9 Width: (cm) 5.1 Depth: (cm) 0.6 Area: (cm) 36.05 Volume: (cm) 21.63 % Reduction in Area: -3110.2% % Reduction in Volume: -19212.5% Epithelialization: None Tunneling: No Undermining: No Wound Description Classification: Full Thickness With Exposed Support Structures Wound Margin: Distinct, outline attached Exudate Amount: Large Exudate Type: Purulent Exudate Color: yellow, brown, green Wound Bed Granulation Amount: None Present (0%) Necrotic Amount: Large (67-100%) Necrotic Quality: Eschar, Adherent Slough Foul Odor After Cleansing: Yes Due to Product Use: No Slough/Fibrino Yes Exposed Structure Fascia Exposed: No Fat Layer (Subcutaneous Tissue) Exposed: Yes Tendon Exposed: No Muscle Exposed: Yes Necrosis of Muscle: Yes Joint Exposed: No Bone Exposed: No Treatment Notes Wound #23 (Lower Leg) Wound Laterality: Right, Posterior Cleanser Soap and Water Discharge Instruction: May shower and wash wound with dial antibacterial soap and water prior to dressing change. Peri-Wound Care Sween Lotion (Moisturizing lotion) Discharge Instruction: Apply moisturizing lotion as directed Topical Primary Dressing Cutimed Sorbact Swab Discharge Instruction: Apply to wound bed as instructed Secondary Dressing Woven Gauze Sponge, Non-Sterile 4x4 in Discharge Instruction: Apply over primary dressing as directed. ABD Pad, 8x10 Discharge Instruction: Apply over primary dressing as directed. Secured With Compression Wrap ThreePress (3 layer compression wrap) Discharge Instruction: Apply three layer compression as directed. Compression Stockings Add-Ons Electronic  Signature(s) Signed: 01/10/2021 6:04:09 PM By: Baruch Gouty RN, BSN Entered By: Baruch Gouty on 01/09/2021 11:56:44 -------------------------------------------------------------------------------- Wound Assessment Details Patient Name: Date of Service: [name], [name] 01/09/2021 11:00 A M Medical Record Number: [id number] Patient Account Number: [id number] Date of Birth/Sex: Treating RN: 01/10/1951 (70 y.o. [name] [name] Primary Care Cellie Dardis: [name] Other Clinician: Referring Yamilet Mcfayden: Treating Claritza July/Extender: [name] in Treatment: 459 Wound Status Wound Number: 24 Primary Venous Leg Ulcer Etiology: Wound Location: Right, Anterior Lower Leg Wound Open Wounding Event: Gradually Appeared Status: Date Acquired: 10/10/2020 Comorbid Anemia, Hypertension, Peripheral Arterial Disease, Peripheral Weeks Of Treatment: 13 History: Venous Disease, Raynauds, Scleroderma, Rheumatoid Arthritis, Clustered Wound: No Osteoarthritis Wound Measurements Length: (cm) 1.5 Width: (cm) 1 Depth: (cm) 0.2 Area: (cm) 1.178 Volume: (cm) 0.236 % Reduction in Area: 2.6% %  Reduction in Volume: 35% Epithelialization: Small (1-33%) Tunneling: No Undermining: No Wound Description Classification: Full Thickness Without Exposed Support Structures Wound Margin: Flat and Intact Exudate Amount: Medium Exudate Type: Serous Exudate Color: amber Foul Odor After Cleansing: No Slough/Fibrino Yes Wound Bed Granulation Amount: None Present (0%) Exposed Structure Necrotic Amount: Large (67-100%) Fascia Exposed: No Necrotic Quality: Adherent Slough Fat Layer (Subcutaneous Tissue) Exposed: Yes Tendon Exposed: No Muscle Exposed: No Joint Exposed: No Bone Exposed: No Treatment Notes Wound #24 (Lower Leg) Wound Laterality: Right, Anterior Cleanser Soap and Water Discharge Instruction: May shower and wash wound with dial antibacterial soap and water prior  to dressing change. Peri-Wound Care Sween Lotion (Moisturizing lotion) Discharge Instruction: Apply moisturizing lotion as directed Topical Primary Dressing Cutimed Sorbact Swab Discharge Instruction: Apply to wound bed as instructed Secondary Dressing Woven Gauze Sponge, Non-Sterile 4x4 in Discharge Instruction: Apply over primary dressing as directed. ABD Pad, 8x10 Discharge Instruction: Apply over primary dressing as directed. Secured With Compression Wrap ThreePress (3 layer compression wrap) Discharge Instruction: Apply three layer compression as directed. Compression Stockings Add-Ons Electronic Signature(s) Signed: 01/10/2021 6:04:09 PM By: Baruch Gouty RN, BSN Entered By: Baruch Gouty on 01/09/2021 11:57:18 -------------------------------------------------------------------------------- Wound Assessment Details Patient Name: Date of Service: [name], [name] 01/09/2021 11:00 A M Medical Record Number: [id number] Patient Account Number: [id number] Date of Birth/Sex: Treating RN: 1951/04/21 (70 y.o. [name] [name], [name] [name] Primary Care Jakeline Dave: [name] Other Clinician: Referring Jeniya Flannigan: Treating Devinne Epstein/Extender: [name] in Treatment: 459 Wound Status Wound Number: 25 Primary Venous Leg Ulcer Etiology: Wound Location: Left, Lateral Foot Wound Open Wounding Event: Gradually Appeared Status: Date Acquired: 10/10/2020 Comorbid Anemia, Hypertension, Peripheral Arterial Disease, Peripheral Weeks Of Treatment: 13 History: Venous Disease, Raynauds, Scleroderma, Rheumatoid Arthritis, Clustered Wound: No Osteoarthritis Wound Measurements Length: (cm) 1.1 Width: (cm) 0.7 Depth: (cm) 0.1 Area: (cm) 0.605 Volume: (cm) 0.06 % Reduction in Area: -60.5% % Reduction in Volume: -57.9% Epithelialization: Small (1-33%) Tunneling: No Undermining: No Wound Description Classification: Full Thickness Without Exposed Support  Structures Wound Margin: Flat and Intact Exudate Amount: Small Exudate Type: Serous Exudate Color: amber Foul Odor After Cleansing: No Slough/Fibrino Yes Wound Bed Granulation Amount: None Present (0%) Exposed Structure Necrotic Amount: Large (67-100%) Fascia Exposed: No Necrotic Quality: Adherent Slough Fat Layer (Subcutaneous Tissue) Exposed: Yes Tendon Exposed: No Muscle Exposed: No Joint Exposed: No Bone Exposed: No Treatment Notes Wound #25 (Foot) Wound Laterality: Left, Lateral Cleanser Soap and Water Discharge Instruction: May shower and wash wound with dial antibacterial soap and water prior to dressing change. Peri-Wound Care Sween Lotion (Moisturizing lotion) Discharge Instruction: Apply moisturizing lotion as directed Topical Primary Dressing Cutimed Sorbact Swab Discharge Instruction: Apply to wound bed as instructed Secondary Dressing Woven Gauze Sponge, Non-Sterile 4x4 in Discharge Instruction: Apply over primary dressing as directed. ABD Pad, 8x10 Discharge Instruction: Apply over primary dressing as directed. Secured With Compression Wrap ThreePress (3 layer compression wrap) Discharge Instruction: Apply three layer compression as directed. Compression Stockings Add-Ons Electronic Signature(s) Signed: 01/10/2021 6:04:09 PM By: Baruch Gouty RN, BSN Entered By: Baruch Gouty on 01/09/2021 11:57:50 -------------------------------------------------------------------------------- Wound Assessment Details Patient Name: Date of Service: [name], [name] 01/09/2021 11:00 A M Medical Record Number: [id number] Patient Account Number: [id number] Date of Birth/Sex: Treating RN: August 31, 1951 (70 y.o. [name] [name] Primary Care Yamil Dougher: [name] Other Clinician: Referring Kristain Filo: Treating Arthelia Callicott/Extender: [name] in Treatment: 459 Wound Status Wound Number: 26 Primary Venous Leg Ulcer  Etiology: Wound Location:  Right, Lateral Lower Leg Wound Open Wounding Event: Gradually Appeared Status: Date Acquired: 10/27/2020 Comorbid Anemia, Hypertension, Peripheral Arterial Disease, Peripheral Weeks Of Treatment: 10 History: Venous Disease, Raynauds, Scleroderma, Rheumatoid Arthritis, Clustered Wound: No Osteoarthritis Wound Measurements Length: (cm) 1.3 Width: (cm) 1.4 Depth: (cm) 0.2 Area: (cm) 1.429 Volume: (cm) 0.286 % Reduction in Area: -188.7% % Reduction in Volume: -93.2% Epithelialization: Small (1-33%) Tunneling: No Undermining: No Wound Description Classification: Full Thickness Without Exposed Support Structures Wound Margin: Flat and Intact Exudate Amount: Medium Exudate Type: Purulent Exudate Color: yellow, brown, green Foul Odor After Cleansing: Yes Due to Product Use: No Slough/Fibrino Yes Wound Bed Granulation Amount: None Present (0%) Exposed Structure Necrotic Amount: Large (67-100%) Fascia Exposed: No Necrotic Quality: Adherent Slough Fat Layer (Subcutaneous Tissue) Exposed: Yes Tendon Exposed: No Muscle Exposed: No Joint Exposed: No Bone Exposed: No Assessment Notes green tinged drainage Treatment Notes Wound #26 (Lower Leg) Wound Laterality: Right, Lateral Cleanser Soap and Water Discharge Instruction: May shower and wash wound with dial antibacterial soap and water prior to dressing change. Peri-Wound Care Sween Lotion (Moisturizing lotion) Discharge Instruction: Apply moisturizing lotion as directed Topical Primary Dressing Cutimed Sorbact Swab Discharge Instruction: Apply to wound bed as instructed Secondary Dressing Woven Gauze Sponge, Non-Sterile 4x4 in Discharge Instruction: Apply over primary dressing as directed. ABD Pad, 8x10 Discharge Instruction: Apply over primary dressing as directed. Secured With Compression Wrap ThreePress (3 layer compression wrap) Discharge Instruction: Apply three layer compression as directed. Compression  Stockings Add-Ons Electronic Signature(s) Signed: 01/10/2021 6:04:09 PM By: Baruch Gouty RN, BSN Entered By: Baruch Gouty on 01/09/2021 11:59:05 -------------------------------------------------------------------------------- Wound Assessment Details Patient Name: Date of Service: [name], [name] 01/09/2021 11:00 A M Medical Record Number: [id number] Patient Account Number: [id number] Date of Birth/Sex: Treating RN: 1951-08-11 (70 y.o. [name] [name], [name] [name] Primary Care Samah Lapiana: [name] Other Clinician: Referring Kessler Kopinski: Treating Glennie Rodda/Extender: [name] in Treatment: [place] Wound Status Wound Number: 27 Primary Venous Leg Ulcer Etiology: Wound Location: Right, Distal, Anterior Lower Leg Wound Open Wounding Event: Gradually Appeared Status: Date Acquired: 11/07/2020 Comorbid Anemia, Hypertension, Peripheral Arterial Disease, Peripheral Weeks Of Treatment: 9 History: Venous Disease, Raynauds, Scleroderma, Rheumatoid Arthritis, Clustered Wound: No Osteoarthritis Wound Measurements Length: (cm) 2 Width: (cm) 0.6 Depth: (cm) 0.1 Area: (cm) 0.942 Volume: (cm) 0.094 % Reduction in Area: -232.9% % Reduction in Volume: -235.7% Epithelialization: Small (1-33%) Tunneling: No Undermining: No Wound Description Classification: Full Thickness Without Exposed Support Structures Wound Margin: Flat and Intact Exudate Amount: Medium Exudate Type: Serous Exudate Color: amber Foul Odor After Cleansing: No Slough/Fibrino Yes Wound Bed Granulation Amount: Small (1-33%) Exposed Structure Granulation Quality: Pink Fascia Exposed: No Necrotic Amount: Large (67-100%) Fat Layer (Subcutaneous Tissue) Exposed: Yes Necrotic Quality: Adherent Slough Tendon Exposed: No Muscle Exposed: No Joint Exposed: No Bone Exposed: No Treatment Notes Wound #27 (Lower Leg) Wound Laterality: Right, Anterior, Distal Cleanser Soap and Water Discharge  Instruction: May shower and wash wound with dial antibacterial soap and water prior to dressing change. Peri-Wound Care Sween Lotion (Moisturizing lotion) Discharge Instruction: Apply moisturizing lotion as directed Topical Primary Dressing Cutimed Sorbact Swab Discharge Instruction: Apply to wound bed as instructed Secondary Dressing Woven Gauze Sponge, Non-Sterile 4x4 in Discharge Instruction: Apply over primary dressing as directed. ABD Pad, 8x10 Discharge Instruction: Apply over primary dressing as directed. Secured With Compression Wrap ThreePress (3 layer compression wrap) Discharge Instruction: Apply three layer compression as directed. Compression Stockings Add-Ons Electronic Signature(s) Signed: 01/10/2021  6:04:09 PM By: Baruch Gouty RN, BSN Entered By: Baruch Gouty on 01/09/2021 12:00:24 -------------------------------------------------------------------------------- Wound Assessment Details Patient Name: Date of Service: [name], [name] 01/09/2021 11:00 A M Medical Record Number: [id number] Patient Account Number: [id number] Date of Birth/Sex: Treating RN: 03/22/1951 (70 y.o. [name] [name], [name] [name] Primary Care Mayleigh Tetrault: [name] Other Clinician: Referring Jupiter Boys: Treating Leonette Tischer/Extender: [name] in Treatment: [place] Wound Status Wound Number: 28 Primary Arterial Insufficiency Ulcer Etiology: Wound Location: Left, Lateral Lower Leg Wound Open Wounding Event: Trauma Status: Date Acquired: 12/09/2020 Comorbid Anemia, Hypertension, Peripheral Arterial Disease, Peripheral Weeks Of Treatment: 3 History: Venous Disease, Raynauds, Scleroderma, Rheumatoid Arthritis, Clustered Wound: No Osteoarthritis Wound Measurements Length: (cm) 0.6 Width: (cm) 0.4 Depth: (cm) 0.1 Area: (cm) 0.188 Volume: (cm) 0.019 % Reduction in Area: 62% % Reduction in Volume: 61.2% Epithelialization: None Tunneling: No Undermining: No Wound  Description Classification: Full Thickness Without Exposed Support Structures Wound Margin: Flat and Intact Exudate Amount: Small Exudate Type: Serous Exudate Color: amber Foul Odor After Cleansing: No Slough/Fibrino No Wound Bed Granulation Amount: Medium (34-66%) Exposed Structure Granulation Quality: Pink Fascia Exposed: No Necrotic Amount: Medium (34-66%) Fat Layer (Subcutaneous Tissue) Exposed: Yes Necrotic Quality: Adherent Slough Tendon Exposed: No Muscle Exposed: No Joint Exposed: No Bone Exposed: No Treatment Notes Wound #28 (Lower Leg) Wound Laterality: Left, Lateral Cleanser Soap and Water Discharge Instruction: May shower and wash wound with dial antibacterial soap and water prior to dressing change. Peri-Wound Care Sween Lotion (Moisturizing lotion) Discharge Instruction: Apply moisturizing lotion as directed Topical Primary Dressing Cutimed Sorbact Swab Discharge Instruction: Apply to wound bed as instructed Secondary Dressing Woven Gauze Sponge, Non-Sterile 4x4 in Discharge Instruction: Apply over primary dressing as directed. ABD Pad, 8x10 Discharge Instruction: Apply over primary dressing as directed. Secured With Compression Wrap ThreePress (3 layer compression wrap) Discharge Instruction: Apply three layer compression as directed. Compression Stockings Add-Ons Electronic Signature(s) Signed: 01/10/2021 6:04:09 PM By: Baruch Gouty RN, BSN Entered By: Baruch Gouty on 01/09/2021 12:01:21 -------------------------------------------------------------------------------- Wound Assessment Details Patient Name: Date of Service: [name], [name] 01/09/2021 11:00 A M Medical Record Number: [id number] Patient Account Number: [id number] Date of Birth/Sex: Treating RN: [birth year] (70 y.o. [name] [name], [name] [name] Primary Care Hedwig Mcfall: [name] Other Clinician: Referring Virdia Ziesmer: Treating Rayner Erman/Extender: [name] in  Treatment: [place] Wound Status Wound Number: 29 Primary Arterial Insufficiency Ulcer Etiology: Wound Location: Right, Proximal, Lateral Lower Leg Wound Open Wounding Event: Gradually Appeared Status: Date Acquired: 12/14/2020 Comorbid Anemia, Hypertension, Peripheral Arterial Disease, Peripheral Weeks Of Treatment: 3 History: Venous Disease, Raynauds, Scleroderma, Rheumatoid Arthritis, Clustered Wound: No Osteoarthritis Wound Measurements Length: (cm) 1.5 Width: (cm) 1.1 Depth: (cm) 0.1 Area: (cm) 1.296 Volume: (cm) 0.13 % Reduction in Area: -10% % Reduction in Volume: 81.6% Epithelialization: Small (1-33%) Tunneling: No Undermining: No Wound Description Classification: Full Thickness Without Exposed Support Structures Wound Margin: Flat and Intact Exudate Amount: Medium Exudate Type: Purulent Exudate Color: yellow, brown, green Foul Odor After Cleansing: No Slough/Fibrino Yes Wound Bed Granulation Amount: None Present (0%) Exposed Structure Necrotic Amount: Large (67-100%) Fascia Exposed: No Necrotic Quality: Adherent Slough Fat Layer (Subcutaneous Tissue) Exposed: Yes Tendon Exposed: No Muscle Exposed: No Joint Exposed: No Bone Exposed: No Assessment Notes green tinged drainage Treatment Notes Wound #29 (Lower Leg) Wound Laterality: Right, Lateral, Proximal Cleanser Soap and Water Discharge Instruction: May shower and wash wound with dial antibacterial soap and water prior to dressing change. Peri-Wound Care Sween Lotion (Moisturizing  lotion) Discharge Instruction: Apply moisturizing lotion as directed Topical Primary Dressing Cutimed Sorbact Swab Discharge Instruction: Apply to wound bed as instructed Secondary Dressing Woven Gauze Sponge, Non-Sterile 4x4 in Discharge Instruction: Apply over primary dressing as directed. ABD Pad, 8x10 Discharge Instruction: Apply over primary dressing as directed. Secured With Compression Wrap ThreePress (3 layer  compression wrap) Discharge Instruction: Apply three layer compression as directed. Compression Stockings Add-Ons Electronic Signature(s) Signed: 01/10/2021 6:04:09 PM By: Baruch Gouty RN, BSN Entered By: Baruch Gouty on 01/09/2021 12:02:17 -------------------------------------------------------------------------------- Wound Assessment Details Patient Name: Date of Service: [name], [name] 01/09/2021 11:00 A M Medical Record Number: [id number] Patient Account Number: [id number] Date of Birth/Sex: Treating RN: [birth year] (70 y.o. [name] [name], [name] [name] Primary Care Noela Brothers: [name] Other Clinician: Referring Chai Routh: Treating Deirdre Gryder/Extender: [name] in Treatment: [place] Wound Status Wound Number: 85 Primary T be determined o Etiology: Wound Location: Right T Fourth oe Wound Open Wounding Event: Gradually Appeared Status: Date Acquired: 12/26/2020 Date Acquired: 12/26/2020 Comorbid Anemia, Hypertension, Peripheral Arterial Disease, Peripheral Weeks Of Treatment: 2 History: Venous Disease, Raynauds, Scleroderma, Rheumatoid Arthritis, Clustered Wound: No Osteoarthritis Wound Measurements Length: (cm) 1.5 Width: (cm) 2.8 Depth: (cm) 0.1 Area: (cm) 3.299 Volume: (cm) 0.33 % Reduction in Area: 25% % Reduction in Volume: 25% Epithelialization: None Tunneling: No Undermining: No Wound Description Classification: Full Thickness Without Exposed Support Structures Wound Margin: Flat and Intact Exudate Amount: Medium Exudate Type: Purulent Exudate Color: yellow, brown, green Foul Odor After Cleansing: No Slough/Fibrino Yes Wound Bed Granulation Amount: None Present (0%) Exposed Structure Necrotic Amount: Large (67-100%) Fascia Exposed: No Necrotic Quality: Eschar, Adherent Slough Fat Layer (Subcutaneous Tissue) Exposed: Yes Tendon Exposed: No Muscle Exposed: No Joint Exposed: No Bone Exposed: No Treatment Notes Wound #30  (Toe Fourth) Wound Laterality: Right Cleanser Peri-Wound Care Topical Primary Dressing KerraCel Ag Gelling Fiber Dressing, 4x5 in (silver alginate) Discharge Instruction: Apply silver alginate to wound bed as instructed Secondary Dressing Woven Gauze Sponges 2x2 in Discharge Instruction: Apply over primary dressing as directed. Secured With Conforming Stretch Gauze Bandage, Sterile 2x75 (in/in) Discharge Instruction: Secure with stretch gauze as directed. Paper Tape, 1x10 (in/yd) Discharge Instruction: Secure dressing with tape as directed. Compression Wrap Compression Stockings Add-Ons Electronic Signature(s) Signed: 01/10/2021 6:04:09 PM By: Baruch Gouty RN, BSN Entered By: Baruch Gouty on 01/09/2021 12:02:54 -------------------------------------------------------------------------------- Wound Assessment Details Patient Name: Date of Service: [name], [name] 01/09/2021 11:00 A M Medical Record Number: [id number] Patient Account Number: [id number] Date of Birth/Sex: Treating RN: Oct 12, 1951 (70 y.o. [name] [name], [name] [name] Primary Care Vinay Ertl: [name] Other Clinician: Referring Austynn Pridmore: Treating Maximilien Hayashi/Extender: [name] in Treatment: 459 Wound Status Wound Number: 31 Primary Venous Leg Ulcer Etiology: Wound Location: Left, Medial Ankle Wound Open Wounding Event: Gradually Appeared Status: Date Acquired: 01/09/2021 Comorbid Anemia, Hypertension, Peripheral Arterial Disease, Peripheral Weeks Of Treatment: 0 History: Venous Disease, Raynauds, Scleroderma, Rheumatoid Arthritis, Clustered Wound: No Osteoarthritis Wound Measurements Length: (cm) 0.8 Width: (cm) 1.2 Depth: (cm) 0.1 Area: (cm) 0.754 Volume: (cm) 0.075 % Reduction in Area: % Reduction in Volume: Epithelialization: Small (1-33%) Tunneling: No Undermining: No Wound Description Classification: Full Thickness Without Exposed Support Structures Wound Margin:  Fibrotic scar, thickened scar Exudate Amount: Small Exudate Type: Serosanguineous Exudate Color: red, brown Foul Odor After Cleansing: No Slough/Fibrino Yes Wound Bed Granulation Amount: Medium (34-66%) Exposed Structure Granulation Quality: Red Fascia Exposed: No Necrotic Amount: Medium (34-66%) Fat Layer (Subcutaneous Tissue) Exposed: Yes Necrotic Quality:  Adherent Slough Tendon Exposed: No Muscle Exposed: No Joint Exposed: No Bone Exposed: No Treatment Notes Wound #31 (Ankle) Wound Laterality: Left, Medial Cleanser Soap and Water Discharge Instruction: May shower and wash wound with dial antibacterial soap and water prior to dressing change. Peri-Wound Care Topical Primary Dressing Secondary Dressing Secured With Compression Wrap Compression Stockings Add-Ons Electronic Signature(s) Signed: 01/10/2021 6:04:09 PM By: Baruch Gouty RN, BSN Entered By: Baruch Gouty on 01/09/2021 11:40:52 -------------------------------------------------------------------------------- Wound Assessment Details Patient Name: Date of Service: [name], [name] 01/09/2021 11:00 A M Medical Record Number: [id number] Patient Account Number: [id number] Date of Birth/Sex: Treating RN: 05/24/1951 (70 y.o. [name] [name], [name] [name] Primary Care Kendyll Huettner: [name] Other Clinician: Referring Alain Deschene: Treating Latarsha Zani/Extender: [name] in Treatment: [place] Wound Status Wound Number: 32 Primary Auto-immune Etiology: Wound Location: Right T Great oe Wound Open Wounding Event: Gradually Appeared Status: Date Acquired: 01/05/2021 Comorbid Anemia, Hypertension, Peripheral Arterial Disease, Peripheral Weeks Of Treatment: 0 History: Venous Disease, Raynauds, Scleroderma, Rheumatoid Arthritis, Clustered Wound: No Osteoarthritis Wound Measurements Length: (cm) 1.1 Width: (cm) 0.9 Depth: (cm) 0.1 Area: (cm) 0.778 Volume: (cm) 0.078 % Reduction in Area: %  Reduction in Volume: Epithelialization: Small (1-33%) Tunneling: No Undermining: No Wound Description Classification: Full Thickness Without Exposed Support Structures Wound Margin: Flat and Intact Exudate Amount: Small Exudate Type: Serous Exudate Color: amber Foul Odor After Cleansing: No Slough/Fibrino No Wound Bed Granulation Amount: Small (1-33%) Exposed Structure Granulation Quality: Pink Fascia Exposed: No Necrotic Amount: Large (67-100%) Fat Layer (Subcutaneous Tissue) Exposed: Yes Necrotic Quality: Adherent Slough Tendon Exposed: No Muscle Exposed: No Joint Exposed: No Bone Exposed: No Treatment Notes Wound #32 (Toe Great) Wound Laterality: Right Cleanser Peri-Wound Care Topical Primary Dressing KerraCel Ag Gelling Fiber Dressing, 4x5 in (silver alginate) Discharge Instruction: Apply silver alginate to wound bed as instructed Secondary Dressing Woven Gauze Sponges 2x2 in Discharge Instruction: Apply over primary dressing as directed. Secured With Conforming Stretch Gauze Bandage, Sterile 2x75 (in/in) Discharge Instruction: Secure with stretch gauze as directed. Paper Tape, 1x10 (in/yd) Discharge Instruction: Secure dressing with tape as directed. Compression Wrap Compression Stockings Add-Ons Electronic Signature(s) Signed: 01/10/2021 6:04:09 PM By: Baruch Gouty RN, BSN Entered By: Baruch Gouty on 01/09/2021 12:07:11 -------------------------------------------------------------------------------- Wound Assessment Details Patient Name: Date of Service: [name], [name] 01/09/2021 11:00 A M Medical Record Number: [id number] Patient Account Number: [id number] Date of Birth/Sex: Treating RN: 1951-06-06 (70 y.o. [name] [name], [name] [name] Primary Care Kou Gucciardo: [name] Other Clinician: Referring Jaren Kearn: Treating Cheronda Erck/Extender: [name] in Treatment: Gracey Wound Status Wound Number: 5 Primary Venous Leg  Ulcer Etiology: Wound Location: Right, Medial Lower Leg Wound Open Wounding Event: Gradually Appeared Status: Date Acquired: 01/13/2013 Comorbid Anemia, Hypertension, Peripheral Arterial Disease, Peripheral Weeks Of Treatment: 415 History: Venous Disease, Raynauds, Scleroderma, Rheumatoid Arthritis, Clustered Wound: Yes Osteoarthritis Wound Measurements Length: (cm) 2.5 Width: (cm) 2.5 Depth: (cm) 0.2 Area: (cm) 4.909 Volume: (cm) 0.982 % Reduction in Area: -247.2% % Reduction in Volume: -596.5% Epithelialization: None Tunneling: No Undermining: No Wound Description Classification: Full Thickness With Exposed Support Structures Wound Margin: Flat and Intact Exudate Amount: Medium Exudate Type: Serous Exudate Color: amber Foul Odor After Cleansing: No Slough/Fibrino Yes Wound Bed Granulation Amount: None Present (0%) Exposed Structure Necrotic Amount: Large (67-100%) Fascia Exposed: No Necrotic Quality: Adherent Slough Fat Layer (Subcutaneous Tissue) Exposed: Yes Tendon Exposed: No Muscle Exposed: No Joint Exposed: No Bone Exposed: No Treatment Notes Wound #5 (Lower Leg) Wound Laterality:  Right, Medial Cleanser Peri-Wound Care Topical Primary Dressing Secondary Dressing Secured With Compression Wrap Compression Stockings Add-Ons Electronic Signature(s) Signed: 01/10/2021 6:04:09 PM By: Baruch Gouty RN, BSN Entered By: Baruch Gouty on 01/09/2021 12:03:59 -------------------------------------------------------------------------------- Vitals Details Patient Name: Date of Service: [name], [name] 01/09/2021 11:00 A M Medical Record Number: [id number] Patient Account Number: [id number] Date of Birth/Sex: Treating RN: 04/09/1951 (70 y.o. [name] [name] Primary Care Kahlani Graber: [name] Other Clinician: Referring Emori Kamau: Treating Taraji Mungo/Extender: [name] in Treatment: 459 Vital Signs Time Taken:  11:32 Temperature (F): 98.3 Height (in): 68 Pulse (bpm): 98 Source: Stated Respiratory Rate (breaths/min): 18 Weight (lbs): 132 Blood Pressure (mmHg): 166/76 Source: Stated Reference Range: 80 - 120 mg / dl Body Mass Index (BMI): 20.1 Electronic Signature(s) Signed: 01/10/2021 6:04:09 PM By: Baruch Gouty RN, BSN Entered By: Baruch Gouty on 01/09/2021 11:33:43

## 2021-01-11 ENCOUNTER — Telehealth (HOSPITAL_COMMUNITY): Payer: Self-pay | Admitting: Pharmacy Technician

## 2021-01-11 NOTE — Telephone Encounter (Signed)
Sent in provider's portion of [employer] assistance application via fax.  Will follow up.

## 2021-01-12 ENCOUNTER — Encounter (HOSPITAL_BASED_OUTPATIENT_CLINIC_OR_DEPARTMENT_OTHER): Payer: Medicare Other | Admitting: Internal Medicine

## 2021-01-12 ENCOUNTER — Ambulatory Visit (INDEPENDENT_AMBULATORY_CARE_PROVIDER_SITE_OTHER): Payer: Medicare Other | Admitting: Internal Medicine

## 2021-01-12 ENCOUNTER — Encounter: Payer: Self-pay | Admitting: Internal Medicine

## 2021-01-12 ENCOUNTER — Other Ambulatory Visit: Payer: Self-pay

## 2021-01-12 VITALS — BP 130/70 | HR 100 | Ht 67.0 in | Wt 118.0 lb

## 2021-01-12 DIAGNOSIS — M349 Systemic sclerosis, unspecified: Secondary | ICD-10-CM

## 2021-01-12 DIAGNOSIS — Z0000 Encounter for general adult medical examination without abnormal findings: Secondary | ICD-10-CM | POA: Diagnosis not present

## 2021-01-12 DIAGNOSIS — H903 Sensorineural hearing loss, bilateral: Secondary | ICD-10-CM

## 2021-01-12 DIAGNOSIS — M810 Age-related osteoporosis without current pathological fracture: Secondary | ICD-10-CM

## 2021-01-12 DIAGNOSIS — L97522 Non-pressure chronic ulcer of other part of left foot with fat layer exposed: Secondary | ICD-10-CM | POA: Diagnosis not present

## 2021-01-12 DIAGNOSIS — Z8572 Personal history of non-Hodgkin lymphomas: Secondary | ICD-10-CM | POA: Diagnosis not present

## 2021-01-12 DIAGNOSIS — I70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 lower right leg: Secondary | ICD-10-CM | POA: Diagnosis not present

## 2021-01-12 DIAGNOSIS — Z8659 Personal history of other mental and behavioral disorders: Secondary | ICD-10-CM

## 2021-01-12 DIAGNOSIS — I10 Essential (primary) hypertension: Secondary | ICD-10-CM | POA: Diagnosis not present

## 2021-01-12 DIAGNOSIS — D508 Other iron deficiency anemias: Secondary | ICD-10-CM | POA: Diagnosis not present

## 2021-01-12 DIAGNOSIS — E782 Mixed hyperlipidemia: Secondary | ICD-10-CM | POA: Diagnosis not present

## 2021-01-12 DIAGNOSIS — I779 Disorder of arteries and arterioles, unspecified: Secondary | ICD-10-CM

## 2021-01-12 DIAGNOSIS — L97811 Non-pressure chronic ulcer of other part of right lower leg limited to breakdown of skin: Secondary | ICD-10-CM | POA: Diagnosis not present

## 2021-01-12 DIAGNOSIS — I2729 Other secondary pulmonary hypertension: Secondary | ICD-10-CM | POA: Diagnosis not present

## 2021-01-12 DIAGNOSIS — Z9981 Dependence on supplemental oxygen: Secondary | ICD-10-CM | POA: Diagnosis not present

## 2021-01-12 DIAGNOSIS — B965 Pseudomonas (aeruginosa) (mallei) (pseudomallei) as the cause of diseases classified elsewhere: Secondary | ICD-10-CM | POA: Diagnosis not present

## 2021-01-12 DIAGNOSIS — L97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: Secondary | ICD-10-CM | POA: Diagnosis not present

## 2021-01-12 DIAGNOSIS — Z1624 Resistance to multiple antibiotics: Secondary | ICD-10-CM | POA: Diagnosis not present

## 2021-01-12 DIAGNOSIS — B952 Enterococcus as the cause of diseases classified elsewhere: Secondary | ICD-10-CM | POA: Diagnosis not present

## 2021-01-12 DIAGNOSIS — L97212 Non-pressure chronic ulcer of right calf with fat layer exposed: Secondary | ICD-10-CM | POA: Diagnosis not present

## 2021-01-12 DIAGNOSIS — L97528 Non-pressure chronic ulcer of other part of left foot with other specified severity: Secondary | ICD-10-CM | POA: Diagnosis not present

## 2021-01-12 DIAGNOSIS — L97312 Non-pressure chronic ulcer of right ankle with fat layer exposed: Secondary | ICD-10-CM | POA: Diagnosis not present

## 2021-01-12 DIAGNOSIS — N1831 Chronic kidney disease, stage 3a: Secondary | ICD-10-CM | POA: Diagnosis not present

## 2021-01-12 DIAGNOSIS — L97412 Non-pressure chronic ulcer of right heel and midfoot with fat layer exposed: Secondary | ICD-10-CM | POA: Diagnosis not present

## 2021-01-12 DIAGNOSIS — L97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: Secondary | ICD-10-CM | POA: Diagnosis not present

## 2021-01-12 DIAGNOSIS — E039 Hypothyroidism, unspecified: Secondary | ICD-10-CM

## 2021-01-12 DIAGNOSIS — D649 Anemia, unspecified: Secondary | ICD-10-CM

## 2021-01-12 DIAGNOSIS — I70248 Atherosclerosis of native arteries of left leg with ulceration of other part of lower left leg: Secondary | ICD-10-CM | POA: Diagnosis not present

## 2021-01-12 DIAGNOSIS — I739 Peripheral vascular disease, unspecified: Secondary | ICD-10-CM

## 2021-01-12 MED ORDER — DENOSUMAB 60 MG/ML ~~LOC~~ SOSY
60.0000 mg | PREFILLED_SYRINGE | Freq: Once | SUBCUTANEOUS | Status: AC
  Administered 2021-01-12: 60 mg via SUBCUTANEOUS

## 2021-01-12 NOTE — Progress Notes (Signed)
Subjective:    [name], female    DOB: 1951-05-23, 70 y.o.   MRN: [id number]  HPI 70 year old Female seen for follow-up on multiple medical issues including osteoporosis.  Prolia injection given today.  Had DEXA scan June 2021 showing T score -2.8 and had previously been -2.90 in 2018.  This is her annual health maintenance exam and Medicare wellness visit.  She has multiple complex medical issues.  History of non-Hodgkin's lymphoma treated with chemotherapy in 2010 and felt to be cured.  Sees Dr. Lamonte Sakai for pulmonary hypertension.  Has chronic respiratory failure requiring home oxygen.  This is related to scleroderma.  She has scleroderma.  She has hypothyroidism and hypertension.  In November 2020 was hospitalized with diarrhea, volume depletion, hypocalcemia, hypokalemia and hypomagnesemia.  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  Remote history of melanoma.  Was seen in ED  December 15 after falling striking the back of her head.  No loss of consciousness.  Does take Plavix.  Has been going to wound care center for some 9 years according to her husband.  Has upcoming visit with Dr. Oneida Alar, vascular surgeon.    She was admitted to the hospital January 3.  She had ultrasound-guided cannulation left common femoral artery.  Underwent aortogram and selection of right common femoral artery and right lower extremity angiogram.  Had left lower extremity angiogram.  It was felt she had bilateral common femoral treatments in the past.  On the right side she had a healthily calcified area 50% stenosis in the SFA.  Left side had heavily calcified SFA with no flow-limiting stenosis in the popliteal artery.  No intervention was undertaken.  Today we have discovered that her iron level is low at 41.  Ferritin is within normal limits at 179.  Her percent saturation is 15%.  Hemoglobin is 8.6 g and  was 9.9 g on January 3.  Has been prescribed Ferrex 150 mg capsules on January 6. Had capsule endoscopy in November at [facility] showing blood oozing from esophagitis.  She has chronic kidney disease stage IIIA  Remains on chronic anticoagulation  Hyperlipidemia is treated with statin  Chronic leg wounds involving right calcaneus, left lateral malleolus, left venous leg ulcer and leg ulcer right medial lower leg seen by Dr. Quentin Cornwall.  There is a remote history of melanoma.    Had endoscopy by Dr. Watt Climes November 23, 2020 during hospitalization for lower extremity DVT and anemia.  She had had dry cough and dyspnea on exertion prior to admission and hemoglobin was 5.7 g.  Required transfusion of 2 units packed red blood cells.  Showing severe esophagitis thought to be a combination of food particles, reflux and Candida infection.  Treated with nystatin and PPI.  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 Fredna Dow.  Her albumin is low at 3.3 but total protein is normal.  History of hearing loss but does not want to wear hearing aids.  Chronic steroid therapy with Deltasone 5 mg daily.  Social history: She is married.  Husband is supportive.  Patient lost daughter due to malignancy at age 47.  Has a granddaughter who lives in [place] who visits regularly.  Family history: Father with history of lupus, non-Hodgkin's lymphoma involving thoracic area died of lymphoma at age 38.  Mother with history of hyperlipidemia and dad reportedly of atherosclerosis.  She  is allergic to penicillin-causes a rash.  Sulfa antibiotics cause her face to peel.  Levaquin causes flulike symptoms but she can take Cipro.  She had vein ligation and vein stripping in May 2013 including debridement of right lower leg by Dr. [name], endovenous ablation saphenous vein with laser July 2013 involving right greater saphenous vein, endovenous ablation saphenous vein with laser left greater  saphenous vein by Dr. [name] October 2013.  Right femoral endarterectomy by Dr. [name] in 2017.  She saw him for extensive leg ulceration August 2017.  History of arterial insufficiency in [place] with bilateral lower extremity stenting a number of years ago.  Review of Systems see above-both patient and husband are frustrated with her lack of improvement.  She is a bit depressed.  Discussion regarding pain medication and antidepressant medication.  Has had tramadol in the past for leg pain.     Objective:   Physical Exam Blood pressure 130/70 pulse 100  Seen sitting in wheelchair.  Husband is present.  Neck is supple without JVD thyromegaly or carotid bruits.  Chest is clear to auscultation.  Cardiac exam regular rate and rhythm.  Abdomen is soft nondistended without masses.  Affect is flat.       Assessment & Plan:  Scleroderma-Dr. Amil Amen is heard rheumatologist  Pulmonary hypertension seen by Dr. Lamonte Sakai with chronic respiratory failure requiring home oxygen  Peripheral vascular disease-seen by Dr. Oneida Alar  Iron deficiency scheduled for IV iron infusion by [facility] [place]) To be done at cancer center on February 2.  History of non-Hodgkin's lymphoma treated in [place] and felt to be cured  Issues with esophagitis and GI bleeding followed by Dr. Watt Climes  Hypothyroidism on thyroid replacement medication  History of essential hypertension-stable  Hearing loss  Remote history of melanoma  Stage III chronic kidney disease  Hyperlipidemia treated with statin  Chronic anticoagulation  Depression-increase Celexa to 40 mg daily  Plan: Follow-up with Dr. Oneida Alar regarding peripheral vascular disease and leg wounds as well as Dr. Dellia Nims.  Continue current medications.  Will be getting IV iron infusion.     We will plan to see her for 68-monthfollow-up in July.  Addendum: Patient has been referred by Dr. FOneida Alarto plastic surgeon to see if surgery is appropriate for nonhealing leg  wounds.  Subjective:   Patient presents for Medicare Annual/Subsequent preventive examination.  Review Past Medical/Family/Social: See above   Risk Factors  Current exercise habits: -unable due to multiple medical issues Dietary issues discussed: Low-fat low carbohydrate  Cardiac risk factors: Hyperlipidemia  Depression Screen  (Note: if answer to either of the following is "Yes", a more complete depression screening is indicated)   Over the past two weeks, have you felt down, depressed or hopeless? No  Over the past two weeks, have you felt little interest or pleasure in doing things? No Have you lost interest or pleasure in daily life? No Do you often feel hopeless? No Do you cry easily over simple problems? No   Activities of Daily Living  In your present state of health, do you have any difficulty performing the following activities?:   Driving?  Yes Managing money? No  Feeding yourself? No  Getting from bed to chair?  Yes Climbing a flight of stairs?  Yes Preparing food and eating?:  Yes Bathing or showering?  Yes Getting dressed: Yes Getting to the toilet?  Yes Using the toilet:No  Moving around from place to place: Yes In the past year have you fallen  or had a near fall?:  Yes Are you sexually active? No  Do you have more than one partner? No   Hearing Difficulties: No  Do you often ask people to speak up or repeat themselves? No  Do you experience ringing or noises in your ears? No  Do you have difficulty understanding soft or whispered voices?  Yes Do you feel that you have a problem with memory? No Do you often misplace items? No    Home Safety:  Do you have a smoke alarm at your residence? Yes Do you have grab bars in the bathroom?  Yes Do you have throw rugs in your house?  Yes   Cognitive Testing  Alert? Yes Normal Appearance?Yes  Oriented to person? Yes Place? Yes  Time? Yes  Recall of three objects?  Not tested Can perform simple  calculations? Yes  Displays appropriate judgment?Yes  Can read the correct time from a watch face?Yes   List the Names of Other Physician/Practitioners you currently use:  See referral list for the physicians patient is currently seeing.     Review of Systems: See above   Objective:     General appearance: Appears stated age  Head: Normocephalic, without obvious abnormality, atraumatic  Eyes: conj clear, EOMi PEERLA  Ears: normal TM's and external ear canals both ears  Nose: Nares normal. Septum midline. Mucosa normal. No drainage or sinus tenderness.  Throat: lips, mucosa, and tongue normal; teeth and gums normal  Neck: no adenopathy, no carotid bruit, no JVD, supple, symmetrical, trachea midline and thyroid not enlarged, symmetric, no tenderness/mass/nodules  No CVA tenderness.  Lungs: clear to auscultation bilaterally  Breasts: normal appearance, no masses or tenderness Heart: regular rate and rhythm, S1, S2 normal, no murmur, click, rub or gallop  Abdomen: soft, non-tender; bowel sounds normal; no masses, no organomegaly  Musculoskeletal: ROM normal in all joints, no crepitus, no deformity, Normal muscle strengthen. Back  is symmetric, no curvature. Skin: Skin color, texture, turgor normal. No rashes or lesions  Lymph nodes: Cervical, supraclavicular, and axillary nodes normal.  Neurologic: CN 2 -12 Normal, Normal symmetric reflexes. Normal coordination and gait  Psych: Alert & Oriented x 3, Mood appear stable.  Affect a bit flat   Assessment:    Annual wellness medicare exam   Plan:    During the course of the visit the patient was educated and counseled about appropriate screening and preventive services including:   Annual flu vaccine  Has had COVID vaccines     Patient Instructions (the written plan) was given to the patient.  Medicare Attestation  I have personally reviewed:  The patient's medical and social history  Their use of alcohol, tobacco or  illicit drugs  Their current medications and supplements  The patient's functional ability including ADLs,fall risks, home safety risks, cognitive, and hearing and visual impairment  Diet and physical activities  Evidence for depression or mood disorders  The patient's weight, height, BMI, and visual acuity have been recorded in the chart. I have made referrals, counseling, and provided education to the patient based on review of the above and I have provided the patient with a written personalized care plan for preventive services.

## 2021-01-12 NOTE — Progress Notes (Signed)
[name] [name], [name] [name] ([id number]) Visit Report for 01/09/2021 Debridement Details Patient Name: Date of Service: [name] [name] 01/09/2021 11:00 A M Medical Record Number: [id number] Patient Account Number: [id number] Date of Birth/Sex: Treating RN: 24-Apr-1951 (70 y.o. [name] Primary Care Provider: [name] Other Clinician: Referring Provider: Treating Provider/Extender: [name] in Treatment: 459 Debridement Performed for Assessment: Wound #25 Left,Lateral Foot Performed By: Physician Ricard Dillon., MD Debridement Type: Debridement Severity of Tissue Pre Debridement: Fat layer exposed Level of Consciousness (Pre-procedure): Awake and Alert Pre-procedure Verification/Time Out Yes - 12:30 Taken: Start Time: 12:30 Pain Control: Other : Benzocaine 20% T Area Debrided (L x W): otal 1.1 (cm) x 0.7 (cm) = 0.77 (cm) Tissue and other material debrided: Viable, Non-Viable, Slough, Subcutaneous, Slough Level: Skin/Subcutaneous Tissue Debridement Description: Excisional Instrument: Curette Bleeding: Minimum Hemostasis Achieved: Pressure End Time: 12:31 Procedural Pain: 4 Post Procedural Pain: 2 Response to Treatment: Procedure was tolerated well Level of Consciousness (Post- Awake and Alert procedure): Post Debridement Measurements of Total Wound Length: (cm) 1.1 Width: (cm) 0.7 Depth: (cm) 0.1 Volume: (cm) 0.06 Character of Wound/Ulcer Post Debridement: Requires Further Debridement Severity of Tissue Post Debridement: Fat layer exposed Post Procedure Diagnosis Same as Pre-procedure Electronic Signature(s) Signed: 01/09/2021 5:04:19 PM By: Levan Hurst RN, BSN Signed: 01/12/2021 5:01:02 PM By: Linton Ham MD Entered By: Levan Hurst on 01/09/2021 12:34:22 -------------------------------------------------------------------------------- Debridement Details Patient Name: Date of Service: [name] LO CK, [name] 01/09/2021 11:00 A  M Medical Record Number: [id number] Patient Account Number: [id number] Date of Birth/Sex: Treating RN: December 10, 1951 (70 y.o. [name] Primary Care Provider: [name] Other Clinician: Referring Provider: Treating Provider/Extender: [name] in Treatment: 459 Debridement Performed for Assessment: Wound #20 Left,Lateral Malleolus Performed By: Physician Ricard Dillon., MD Debridement Type: Debridement Severity of Tissue Pre Debridement: Fat layer exposed Level of Consciousness (Pre-procedure): Awake and Alert Pre-procedure Verification/Time Out Yes - 12:30 Taken: Start Time: 12:30 Pain Control: Other : Benzocaine 20% T Area Debrided (L x W): otal 0.9 (cm) x 0.8 (cm) = 0.72 (cm) Tissue and other material debrided: Viable, Non-Viable, Slough, Subcutaneous, Slough Level: Skin/Subcutaneous Tissue Debridement Description: Excisional Instrument: Curette Bleeding: Minimum Hemostasis Achieved: Pressure End Time: 12:31 Procedural Pain: 4 Post Procedural Pain: 2 Response to Treatment: Procedure was tolerated well Level of Consciousness (Post- Awake and Alert procedure): Post Debridement Measurements of Total Wound Length: (cm) 0.9 Width: (cm) 0.8 Depth: (cm) 0.2 Volume: (cm) 0.113 Character of Wound/Ulcer Post Debridement: Requires Further Debridement Severity of Tissue Post Debridement: Fat layer exposed Post Procedure Diagnosis Same as Pre-procedure Electronic Signature(s) Signed: 01/09/2021 5:04:19 PM By: Levan Hurst RN, BSN Signed: 01/12/2021 5:01:02 PM By: Linton Ham MD Entered By: Levan Hurst on 01/09/2021 12:34:58 -------------------------------------------------------------------------------- HPI Details Patient Name: Date of Service: [name] LO CK, [name] 01/09/2021 11:00 A M Medical Record Number: [id number] Patient Account Number: [id number] Date of Birth/Sex: Treating RN: July 04, 1951 (70 y.o. [name] Primary  Care Provider: [name] Other Clinician: Referring Provider: Treating Provider/Extender: [name] in Treatment: 459 History of Present Illness HPI Description: this is a patient who initially came to [place] for wounds on the medial malleoli bilaterally as well as her upper medial lower extremities bilaterally. These wounds eventually healed with assistance of Apligraf's bilaterally. While this was occurring she developed the current wound which opened into a fairly substantial wound on the right lower extremity. These are mostly  secondary to venous stasis physiology however the patient also has underlying scleroderma, pulmonary hypertension. The wound has been making good progress lately with the Hydrofera Blue-based dressing. 03/17/2015; patient had a arterial evaluation a year ago. Her right ABI was 0.86 left was 1.0. T brachial index was 0.41 on the right 0.45 on the left. Her oe bilateral common femoral artery waveforms were triphasic. Her white popliteal posterior tibial artery and anterior tibial artery waveforms were monophasic with good amplitude. Luteal artery waveforms were biphasic it was felt that her bilateral great toe pressures are of normal although adequate for tissue healing. 03/24/2015. The condition of this wound is not really improved that. He was covered with as fibrinous surface slough and eschar. This underwent an aggressive debridement with both a curette and scalpel. I still cannot really get down to what I can would consider to be a viable surface. There is no evidence of infection. Previous workup for ischemia roughly a year ago was negative nevertheless I think that continues to be a concern 04/07/15. The patient arrived for application of second Apligraf. Once again the surface of this wound is certainly less viable than I would like for an advanced treatment option. An aggressive debridement was done. She developed some arteriolar bleeding which  required that along pressure and silver alginate. 04/21/15: change in the condition of this wound. Once again it is covered in a gelatinous surface slough. After debridement today surface of the wound looks somewhat better but now a heeling surface 05/05/15 Apligraf #4 applied.Still a lot of slough on this wound. 05/19/15 Apligraf #5 applied. Still a lot of slough on this wound I did not aggressively remove this 06/02/15; continued copious amounts of surface slough. This debridement fairly easily. 06/08/2015 -- the last time she had a venous duplex study done was over 3 years ago and the surgery was prior to that. I have recommended that she sees Dr. early for a another opinion regarding a repeat venous duplex and possibly more endovenous ablation of vein stripping of micro-phlebotomies. 06/16/15; wound has a gelatinous surface eschar that the debridement fairly easily to a point. I don't disagree with the venous workup and perhaps even arterial re-evaluation. She is on prednisone 5 mg and continue his medications for her pulmonary artery hypertension I am not sure if the latter have any wound care healing issues I would need to investigate this. 06/23/15 continues with a gelatinous surface eschar with of fibrinous underlying. What I can see of this wound does not look unhealthy however I just can't get this material which I think is 2 different layers off. Empiric culture done 06/30/15; unfortunately not a lot of change in this wound. A gelatinous surface eschar is easily removed however it has a tight fibrinous surface underneath the. 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 The wound comes back and basically and unchanged state the. She has a gelatinous surface that is more easily removed however there is a tightly fibrinous surface underneath the. There is no evidence of infection. She has a vascular follow-up next month. I would have to inject her in order to do a more aggressive  debridement of this area 07/21/15 the wound is roughly in the same state albeit the debridement is done with greater ease. There is less of the fibrinous eschar underneath the. There is no evidence of infection. She has follow-up with vascular surgery next week. No evidence of surrounding infection. Her original distal wounds healed while this  one formed. 07/28/15; wound is easier to debride. No wound erythema. She is seeing Dr. [name] next week. 08/18/15 Has been to [place] for repeat ablation. Have been using medihoney pad with some improvement 08/25/15; absolutely no change in the condition of this wound in either its overall size or surface condition in many months now. At one point I had this down to [place] healthier surface I think with [facility] however this did not actually progress towards closure. Do not believe that the wound has actually deteriorated in terms of volume at all. We have been using a medihoney pad which allows easier debridement of the gelatinous eschar but again no overall actual improvement. the patient is going towards an ablation with pain and vascular which I think is scheduled for next week. The only other investigation that I could first see would be a biopsy. She does have underlying scleroderma 09/02/15 eschar is much easier to debridement however the base of this does not look particularly vibrant. We changed to Iodoflex. The patient had her ablation earlier this week 09/09/15 again the debridement over the base of this wound is easier and the base of the wound looks considerably better. We will continue the Iodoflex. Dr. Tawni Millers has expressed his satisfaction with the result of her ablation 09/15/15 once again the wound is relatively free of surface eschar. No debridement was done today. It has a pale-looking base to it. although this is not as deep as it once was it seems to be expanding especially inferiorly. She has had recent venous ablation but this is no  closer to healing.I've gone ahead and done a punch biopsy this from the inferior part of the wound close to normal skin 09/22/15: the wound is relatively free of surface eschar. There is some surrounding eschar. I'm not exactly sure at what level the surface is that I am seeing. 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 No evidence of atypical infection, malignancy. 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 No debridement was done 09/29/15; the wound surface is relatively free of surface eschar. There is eschar surrounding the walls of the wound. Aside from the improvement in the amount of surface slough. This wound has not progressed any towards closure. There is not even a surface that looks like there at this is ready. There is no evidence of any infection nor maligancy based on biopsy I did on 9/15. I continued with the Iodoflex however I am looking towards some alternative to try to promote some closure or filling in of this surface. Consider triple layer Oasis. Collagen did not result in adequate control of the surface slough 10/13/15; the patient was in hospital last week with severe anemia. The wound looks somewhat better after debridement although there is widening medially. There is no evidence of infection. 10/20/15; patient's wound on the right lateral lower leg is essentially unchanged. This underwent a light surface debridement and in general the debridement is easier and the surface looks improved. I noted in doing this on the side of the wound what appears to be a piece of calcium deposition. The patient noted that she had previous things on the tips of her fingers. 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 She follows with rheumatology and I have asked them to talk to her about this. In view of that S the nonhealing ulcer may have something to do with  calcinosis and  also unrelieved Raynaud's disease in this area. I should note that her original wounds on the right and left medial malleolus and the inner aspects of both legs just below the knees did however heal over 10/30/15; the patient's wound on the right lateral lower leg is essentially unchanged. I was able to remove some calcified material from the medial wound edge. 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 Otherwise the wound appears essentially unchanged there is less adherent surface eschar. Some of the calcified material was sent to pathology for analysis 11/17/15. The patient's wound on the right lateral leg is essentially unchanged. Wider Medially. 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 There is some surface slough but I did not debridement this. No evidence of surrounding infection. 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 This will make healing difficult 12/15/15; the wound has less depth surface has a fibrinous slough and calcifications in the wound edge no evidence of infection 12/22/15; the wound definitely has less Fibrinous slough on the base. Calcifications around the wound edges are still evident. Although the wound bed looks healthier it is still pale in appearance. Previous biopsy did not show malignancy 01/04/15; surgical debridement of nonviable slough and subcutaneous tissue the wound cleans up quite nicely but appears to be expanding outward calcifications around the wound edges are still there. 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 She is to see her rheumatologist I'll see if he has any  opinions on this. My punch biopsy done in September did not show calcifications although these are clearly evident. 01/19/16 light selective debridement of nonviable surface slough. There is epithelialization medially. This gives me reason for cautious optimism. She has been to see her rheumatologist, there is nothing that can be done for this type of soft tissue calcification associated with scleroderma 02/02/16 no debridement although there is a light surface slough. She has 2 peninsulas of skin 1 inferiorly and one medially. We continue to make a slight and slow but definite progressive here 02/16/16; light surface debridement with more attention to the circumference of the wound bed where the fibrinous eschar is more prevalent. No calcifications detected. She seems to have done nicely with the [facility] with some epithelialization and some improvement in the overall wound volume. 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 Continued attention to the circumference of the wound where the fibrinous eschar in calcinosis or prevalent. No calcifications were detected. She has continued improvement with Hydrofera Blue. The wound is no longer as deep 03/15/16 surgical debridement done to remove surface escha Especially around the circumference of the wound where there is nonviable subcutaneous tissue. In spite of this there is considerable improvement in the overall dimensions and depth of the wound. Islands of epithelialization are seen especially medially inferiorly and superiorly to a lesser extent. She is using Hydrofera Blue at home 03/29/16; surgical debridement done to remove surface eschar and nonviable subcutaneous tissue. This cleans up quite nicely mention slightly larger in terms of length and width however depth is less 04/12/16; continued gradual improvement in  terms of depth  and the condition of the wound base. Debridement is done. Continuing long standing Hydrofera Blue at home with Kerlix Coban wraps 04/26/16; continued gradual improvement in terms of depth and management as well as condition of the wound base. Surgical debridement done she continues with Hydrofera Blue. 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 She initially came to this clinic venous insufficiency ulcers which have long since healed 05/17/16 continued improvement in terms of the depth and measurements of this wound although she has a tightly adherent fibrinous slough each time. We've been continued with long standing Hydrofera Blue which seems to done as well for this wound is many advanced treatment options. 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 She also has chronic venous insufficiency. She has an irritation on her lateral right ankle secondary to our wraps 05/10/16; wound appears to be smaller especially on the medial aspect and especially in the width. Wound was debridement surface looks better. She is also been in the hospital apparently with anemia again she tells me she had an endoscopy. Since she got home after 3 days which I believe was sometime last week she has had an irritated painful area on the right lateral ankle surrounding the lateral malleolus 05/31/16; much more adherent surface slough today then recently although I don't think the dimensions of changed that much. A more aggressive debridement is required. The irritated area over her medial malleolus is more pruritic and painful and I don't think represents cellulitis 06/14/16; no major change in her wound dimensions however there is more tightly adherent surface slough which is disappointing. As well as she appears to have a new small area medially. Furthermore an irritated uncomfortable area on the lateral aspect of her right foot just below  the lateral malleolus. 06/21/16; I'm seeing the patient back in follow-up for the new areas under her major wound on the right anterior leg. She has been using Hydrofera Blue to this area probably for several months now and although the dressings seem to be helping for quite a period of time I think things have stagnated lately. She comes in today with a relatively tight adherent surface slough and really no changes in the wound shape or dimension. The 2 small areas she had inferiorly are tiny but still open they seem improved this well. There is no uncontrolled edema and I don't think there is any evidence of cellulitis. 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 Patient has had vascular evaluations both venous and arterial. I have biopsied this area. There is no obvious infection. The worrisome thing today is that she seems to be developing areas of erythema and epithelial damage on the medial aspect of the right foot. Also to a lesser degree inferior to the actual wound itself. Again I see no obvious changes to suggest cellulitis however as this is the only treatable option I will probably give her antibiotics. 07/13/16 no major change in the lady's large anterior right leg wound. Still covered with a very tightly adherent surface slough which is difficult to debridement. There is less erythema around this, culture last week grew pseudomonas I gave her ciprofloxacin. The area on her lateral right malleolus looks better- 08/02/16 the patient's wounds continued to decline. Her original large anterior right leg wound looks deeper. Still adherent surface slough that is difficult to debridement.  She has a small area just below this and a punched-out wound just below her lateral malleolus. In the meantime she is been in hospital with apparently an upper GI bleed on Plavix and aspirin. She is now just on  Plavix she received 3 units of packed cells 08/23/16; since I last saw this 3 weeks ago, the open large area on her right leg looks about the same syrup. She has a small satellite lesion just underneath this. The area on her medial right ankle is now a deep necrotic wound. I attempted to debridement this however there is just too much pain. 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 She follows with vascular surgery and is scheduled for an angiogram in early September 09/06/16; the patient is going for an arteriogram tomorrow. Her original large wound on the right calf is about the same the satellite lesion underneath it is about the same however the area on her medial ankle is now deeper with exposed tendon. I am no longer attempting to debride these wounds 09/20/16; the patient has undergone a right femoral endarterectomy and Dacron patch angioplasty. This seems to have helped the flow in her right leg. 10/04/16; Arrived today for aggressive debridement of the wounds on her right calf the original wound the one beneath it and a difficult area over her right lateral leg just above the lateral malleolus. 10/25/16; her 3 open wounds are about the same in terms of dimensions however the surface appears a lot healthier post debridement. Using Iodoflex 10/18/16 we have been using Iodoflex to her wounds which she tolerated with some difficulty. 10/11/16; has been using Santyl for a period of time with some improvement although again very adherent surface slough would prevent any attempted healing this. She has a original wound on the left calf, the satellite underneath that and the most recent wound on the right medial ankle. She has completed revascularization by Dr. early and has had venous ablation earlier. Want to go back to Iodoflex to see if week and get a healthier surface to this wound bed failing this I think she'll need to be  taken to the OR and I am prepared to call Dr. Marla Roe to discuss this. She is obviously not a good candidate for general anesthesia however.; 11/08/16; I put her on Iodoflex last time to see if I can get the wound bed any healthier and unfortunately today still had tremendous surface slough. 11/15/16; 4 weeks' worth of Iodoflex with not much improvement. Debridement on the major wounds on her left anterior leg is easier however this does not maintain from week to week. The punched-out area on her medial right ankle 11/29/16; I attempted to change the patient last visit to [facility] however she states this burned and was very uncomfortable therefore we gave her permission to go back to the eye out of complex which she already had at home. Also she noted a lot of pain and swelling on the lateral aspect of her leg before she traveled to [facility] for the holidays, I called her in doxycycline over the phone. This seems to have helped 12/06/16; Wounds unchanged by in large. Using Iodoflex 12/13/16; her wounds today actually looks somewhat better. The area on the right lateral lower leg has reasonably healthy-looking granulation and perhaps as actually filled and a bit. Debridement of the 2 wounds on the medial calf is easier and post debridement appears  to have a healthier base. We have referred her to Dr. Migdalia Dk for consideration of operative debridement 12/20/16; we have a quick note from Dr. Merri Ray who feels that the patient needs to be referred to an academic center/plastic surgeon. This is due to the complexity of the patient's medical issues as it applies to anything in the OR. We have been using Iodoflex 12/27/16; in general the wounds on the right leg are better in terms of the difficult to remove surface slough. She has been using Iodoflex. She is approved for Apligraf which I anticipated ordering in the next week or 2 when we get a better-looking surface 01/04/16  the deep wounds on the right leg generally look better. Both of them are debrided further surface slough. The area on the lateral right leg was not debrided. She is approved for Apligraf I think I'll probably order this either next week or the week after depending on the surface of the wounds superiorly. We have been using Iodoflex which will continue until then 01/11/16; the deep wounds on the right leg again have a surface slough that requires debridement. I've not been able to get the wound bed on either one of these wounds down to what would be acceptable for an advanced skin stab-like Apligraf. The area on the medial leg has been improving. We have been using hot Iodoflex to all wounds which seems to do the best at at least limiting the nonviable surface 01/24/17; we have continued Iodoflex and all her wound areas. Her debridement Gen. he is easier and the surface underneath this looks viable. Nevertheless these are large area wounds with exposed muscle at least on the anterior parts. We have ordered Apligraf's for 2 weeks from now. The patient will be away next week 02/07/17; the patient was close to have first Apligraf today however we did not order one. I therefore replaced her Iodoflex. She essentially has 3 large punched- out areas on her right anterior leg and right medial ankle. 02/11/17; Apligraf #1 02/25/17; Apligraf #2. In general some improvement in the right medial ankle and right anterior leg wounds. The larger wound medially perhaps some better 03/11/17; Apligraf #3. In general continued improvement in the right medial ankle and the right anterior leg wounds. 03/25/17 Apligraf #4. In general continued improvement especially on the right medial ankle and the lower 04/08/17; Apligraf #5 in general continued improvement in all wound sites. 04/22/17; post Apligraf #5 her wound beds continued to look a lot better all of them up to the surface of the surrounding skin. Had a caramel-colored slough  that I did not debrided in case there is residual Apligraf effect. The wounds are as good as I have seen these looking quite some time. 04/29/17; we applied Edith Endave and last week after completing her Apligraf. Wounds look as though they've contracted somewhat although they have a nonviable surface which was problematic in the past. Apparently she has been approved for further Apligraf's. We applied Iodosorb today after debridement. 05/06/17; we're fortunate enough today to be able to apply additional Apligraf approved by her insurance. In general all of her wounds look better Apligraf #6 05/24/17; Apligraf #7 continued improvement in all wounds 3 06/10/17 Apligraf #8. Continued improvement in the surface of all wounds. Not much of an improvement in dimensions as I might a follow 06/24/17 Apligraf #9 continued general improvement although not as much change in the wound areas I might of like. She has a new open area on the right  anterior lateral ankle very small and superficial. She also has a necrotic wound on the tip of her right index finger probably secondary to severe uncontrolled Raynaud's phenomenon. She is already on sildenafil and already seen her rheumatologist who gave her Keflex. 07/08/17; Apligraf #10. Generalized improvement although she has a small additional wound just medial to the major wound area. 07/22/17; after discussion we decided not to reorder any further Apligraf's although there is been considerable improvement with these it hasn't been recently. The major wound anteriorly looks better. Smaller wounds beneath this and the more recent one and laterally look about the same. The area on the right lateral lower leg looks about the same 07/29/17 this is a patient who is exceedingly complex. She has advanced scleroderma, crest syndrome including calcinosis, PAD status post revascularization, chronic venous insufficiency status post ablations. She initially presented to this clinic  with wounds on her bilateral lower legs however these closed. More recently we have been dealing with a large open area superiorly on the right anterior leg, a smaller wound underneath this and then another one on the right medial lower extremity. These improve significantly with 10 Apligraf applications. Over the last 2-3 weeks we are making good progress with Hydrofera Blue and these seem to be making progress towards closure 08/19/17; wounds continued to make good improvement with Hydrofera Blue and episodic aggressive debridements 08/26/17; still using Hydrofera Blue. Good improvements 09/09/17; using Hydrofera Blue continued improvement. Area on the lateral part of her right leg has only a small remaining open area. The small inferior satellite region is for all intents and purposes closed. Her major wound also is come in in terms of depth and has advancing epithelialization. 09/16/17; using Hydrofera Blue with continued improvement. The smaller satellite wound. We've closed out today along with a new were satellite wound medially. The area on her medial ankle is still open and her major wound is still open but making improvement. All using Hydrofera Blue. Currently 09/30/17; using Hydrofera Blue. Still a small open area on the lateral right ankle area and her original major wound seems to be making gradual and steady improvement. 10/14/17; still using Hydrofera Blue. Still too small open areas on the right lateral ankle. Her original major wound is horizontal and linear. The most problematic area paradoxically seems to be the area to the medial area wears I thought it would be the lateral. 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 10/28/17; still using Hydrofera Blue. Right lateral ankle has a very small open area superiorly on the most lateral part of the wound. Her original open wound has 2 open areas now separated by normal skin and we've  redefined this. 11/11/17; still using Hydrofera Blue area and right lateral ankle continues to have a small open area on mostly the lateral part of the wound. Her original wound has 2 small open areas now separated by a considerable amount of normal skin 11/28/17; the patient called in slightly before Thanksgiving to report pain and erythema above the wound on the right leg. In the past this is responded well to treatment for cellulitis and I gave her over the phone doxycycline. She stated this resulted in fairly abrupt improvement. We have been using Hydrofera Blue for a prolonged period of time to the larger wound anteriorly into the remaining wound on her right right lateral ankle. The latter is just about closed with only a small linear area and the bottom of  the [place]. 12/02/17; use endoform and left the dressing on since last visit. There is no tenderness and no evidence of infection. 12/16/17; patient has been using Endoform but not making much progress. The 2 punched out open areas anteriorly which were the reminiscence of her major wound appear deeper. The area on the lateral aspect of her right calf also appears deeper. Also she has a puzzling tender swelling above her wound on the right leg. This seems larger than last time. Just above her wounds there appears to be some fluctuance in this area it is not erythematous and there is no crepitus 12/30/17; patient has been using Endoform up until last week we used Hydrofera Blue. Ultrasound of the swelling above her 2 major wounds last time was negative for a fluid collection. I gave her cefaclor for the erythema and tenderness in this area which seems better. Unfortunately both punched out areas anteriorly and the area on her right medial lower leg appear deeper. In fact the lateral of the wounds anteriorly actually looks as though it has exposed tendon and/or muscle sheath. She is not systemically unwell. She is complaining of vaginitis type  symptoms presumably Candida from her antibiotics. 01/06/18; we're using santyl. she has 2 punched out areas anteriorly which were initially part of a large wound. Unfortunately medially this is now open to tendon/muscle. All the wounds have the same adherent very difficult to debride surface. 01/20/18; 2 week follow-up using Santyl. She has the 2 punched out areas anteriorly which were initially part of her large surface wound there. Medially this still has exposed muscle. All of these have the same tightly adherent necrotic surface which requires debridement. PuraPly was not accepted by the patient's insurance however her insurance I think it changed therefore we are going to run Apligraf to gain 02/03/18; the patient has been using Santyl. The wound on the right lateral ankle looks improved and the 2 areas anteriorly on the right leg looks about the same. The medial one has exposed muscle. The lateral 1 requiresdebridement. We use PuraPly today for the first week 02/10/18; PuraPly #2. The patient has 3 wounds. The area on the right lateral ankle, 2 areas anteriorly that were part of her original large wound in this area the medial one has exposed muscle. All of the wounds were lightly debrided with a number 3 curet. PuraPly #2 applied the lateral wound on the calf and the right lateral ankle look better. 02/17/18; PuraPly #3. Patient has 3 wounds. The area on the right lateral ankle in 2 areas internally that were part of her original large wound. The lateral area has exposed muscle. She arrived with some complaints of pain around the right ankle. 02/24/18; PuraPly #4; not much change in any of the 3 wound areas. Right lateral ankle, right lateral calf. Both of these required debridement with a #3 curet. She tolerates this marginally. The area on the medial leg still has exposed muscle. Not much change in dimensions 03/03/18 PuraPly #5. The area on the medial ankle actually looks better however the 2 separate  areas that were original parts of the larger right anterior leg wound look as though they're attempting to coalesce. 03/10/18; PuraPly #6. The area on the medial ankle actually continues to look and measures smaller however the 2 separate areas that were part of the original large wound on the right anterior leg have now coalesced. There hasn't been much improvement here. The lateral area actually has underlying exposed muscle 03/17/18-she is  here in follow-up evaluation for ulcerations to the right lower extremity. She is voicing no complaints or concerns. She tolerated debridement. Puraply#7 placed 03/24/18; difficult right lower extremity ulcerations. PuraPly #8 place. She is been approved for [employer]. She did very well with Apligraf today however she is apparently reached her "lifetime max" 03/31/18; marginal improvement with PuraPly although her wounds looked as good as they have in several weeks today. Used TheraSkins #1 04/14/18 TheraSkin #2 today 04/28/18 TheraSkins #3. Wound slightly improved 05/12/18; TheraSkin skin #4. Wound response has been variable. 05/27/18 TheraSkin #5. Generally improvement in all wound areas. I've also put her in 3 layer compression to help with the severe venous hypertension 06/09/18; patient is done quite well with the TheraSkins unfortunately we have no further applications. I also put her in 3 layer compression last week and that really seems to of helped. 06/16/18; we have been using silver collagen. Wounds are smaller. Still be open area to the muscle layer of her calf however even that is contracted somewhat. She tells me that at night sometimes she has pain on the right lateral calf at the site of her lower wound. Notable that I put her into 3 layer compression about 3 weeks ago. She states that she dangles her leg over the bed that makes it feel better but she does not describe claudication during the day She is going to call her secondary insurance to see if  they will continue to cover advanced treatment products I have reviewed her arterial studies from 01/22/17; this showed an ABI in the right of 1 and on the left noncompressible. TBI on the right at 0.30 on the left at 0.34. It is therefore possible she has significant PAD with medial calcification falsely elevating her ABI into the normal range. I'll need to be careful about asking her about this next week it's possible the 3 layer compression is too much 06/23/18; was able to reapply TheraSkin 1 today. Edema control is good and she is not complaining of pain no claudication 07/07/18;no major change. New wound which was apparently a taper removal injury today in our clinic between her 2 wounds on the right calf TheraSkins #2 07/14/18; I think there is some improvement in the right lateral ankle and the medial part of her wound. There is still exposed muscle medially. 07/28/18; two-week follow-up. TheraSkins#3. Unfortunately no major change. She is not a candidate I don't think for skin grafting due to severe venous hypertension associated with her scleroderma and pulmonary hypertension 08/11/18 Patient is here today for her [employer] application #36 (#5 of the second set). She seems to be doing well and in the base of the wound appears to show some progress at this point. This is the last approved Theraskin of the second set. 08/25/18; she has completed TheraSkin. There has been some improvement on the right lateral calf wound as well as the anterior leg wounds. The open area to muscle medially on the anterior leg wound is smaller. I'm going to transition her back to [facility] under Kerlix Coban change every second day. She reports that she had some calcification removed from her right upper arm. We have had previous problems with calcifications in her wounds on her legs but that has not happened recently 09/08/18;using Hydrofera Blue on both her wound areas. Wounds seem to of contracted nicely. She uses  Kerlix Coban wrap and changes at home herself 09/22/2018; using Hydrofera Blue on both her wound areas. Dimensions seem to have come  down somewhat. There is certainly less depth in the medial part of the mid tibia wound and I do not think there is any exposed muscle at this point. 10/06/2018; 2-week follow-up. Using [facility] on her wound areas. Dimensions have come down nicely both on the right lateral ankle area in the right mid tibial area. She has no new complaints 10/20/2018; 2-week follow-up. She is using Hydrofera Blue. Not much change from the last time she was here. The area on the lateral ankle has less depth although it has raised edges on one side. I attempted to remove as much of the raised edge as I could without creating more additional wounds. The area on the right anterior mid tibia area looks the same. 11/03/2018; 2-week follow-up she is using Hydrofera Blue. On the right anterior leg she now has 2 wounds separated by a large area of normal skin. The area on the medial part still has I think exposed muscle although this area itself is a lot smaller. The area laterally has some depth. Both areas with necrotic debris. The area on her right lateral ankle has come in nicely 11/17/2018; patient continues to use Hydrofera Blue. We have been increasing separation of the 2 wounds anteriorly which were at one point joint the area on the right lateral calf continues to have I think some improvement in depth. 12/08/2018; patient continues to use Hydrofera Blue. There is some improvement in the area on the right lateral calf. The 2 areas that were initially part of the original large wound in the mid right tibia are probably about the same. In fact the medial area is probably somewhat larger. We will run puraply through the patient's insurance 12/22/2018; she has been using Hydrofera Blue. We have small wounds on the right lateral calf and 2 small areas that were initially part of a large  wound in the right mid tibia. We applied pure apply #1 today. 12/29/2018; we applied puraply #2. Her wounds look somewhat better especially on the right lateral calf and the lateral part of her original wound in the mid tibial area. 01/05/2019; perhaps slightly improved in terms of wound bed condition but certainly not as much improvement as I might of liked. Puraply #3 1/13: we did not have a correct sized puraply to apply. wounds more pinched out looking, I increased her compression to 3 layer last week to help with significant multilevel venous hypertension. Since then I've reviewed her arterial status. She has a right femoral endarterectomy and a distal left SFA stent. She was being followed by Dr. [name] for a period however she does not appear to have seen him in 3 years. I will set up an appointment. 1/20. The patient has an additional wound on the right lateral calf between the distal wound and proximal wounds. We did not have Puraply last week. Still does not have a follow-up with Dr. early 1/27: Follow-up with Dr. early has been arranged apparently with follow-up noninvasive studies. Wounds are measuring roughly the same although they certainly look smaller 2/3; the patient had non invasive studies. Her ABIs on the right were 0.83 and on the left 1.02 however there was no great toe pressure bilaterally. Also worrisome monophasic waveforms at the PTA and dorsalis pedis. We are still using Puraply. We have had some improvement in all of the wounds especially the lateral part of the mid tibial area. 2/10; sees Dr. early of vein and vascular re-arterial studies next week. Puraply reapplied today. 2/17; Dr.  early of vein and vascular his appointment is tomorrow. Puraply reapplied after debridement of all wounds 2/24; the patient saw Dr. early I reviewed his note. sHe noted the previous right femoral endarterectomy with a Dacron patch. He also noted the normal ABI and the monophasic waveforms  suggesting tibial disease. Overall he did not feel that she had any evidence of arterial insufficiency that would impair her wound healing. He did note her venous disease as well. He suggested PRN follow-up. 3/2; I had the last puraply applied today. The original wounds over the mid tibia area are improved where is the area on the right lower leg is not 3/9; wounds are smaller especially in the right mid tibia perhaps slightly in the right lateral calf. We finished with puraply and went to endoform today 3/23; the patient arrives after 2 weeks. She has been using endoform. I think all of her wounds look slightly better which includes the area on the right lateral calf just above the right lateral malleolus and the 2 in the right mid tibia which were initially part of the same wound. 4/27; TELEHEALTH visit; the patient was seen for telehealth visit today with her consent in the middle of the worldwide epidemic. Since she was last here she called in for antibiotics with pain and tenderness around the area on the right medial ankle. I gave her empiric doxycycline. She states this feels better. She is using endoform on both of these areas 5/11 TELEHEALTH; the patient was seen for telehealth visit today. She was accompanied at home by her husband. She has severe pulmonary hypertension accompanied scleroderma and in the face of the Covid epidemic cannot be safely brought into our clinic. We have been using endoform on her wound areas. There are essentially 3 wound areas now in the left mid tibia now 2 open areas that it one point were connected and one on the right lateral ankle just above the malleolus. 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 She cannot come to clinic. The wounds were reviewed today via telehealth. She has severe  chronic venous hypertension which I think is centrally mediated. She has wounds on her right anterior tibia and right lateral ankle area. These are chronic. She has been using endoform. 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 We have been following her from telehealth. She has severe chronic venous hypertension which may be mostly centrally mediated secondary to right heart heart failure. She has wounds on her right anterior tibia and right lateral ankle these are chronic we have been using endoform 6/22; TELEHEALTH; this patient was seen today via telehealth. 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 Since the last time we had contact with this patient she developed some pain and erythema around the wound on her right lateral malleolus/ankle and we put in antibiotics for her. This is resulted in good improvement with resolution of the erythema and tenderness. I changed her to silver alginate last time. We had been using endoform for an extended period of time 7/13; TELEHEALTH; this patient was seen today via telehealth. 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 She would continue to be at a prohibitive risk to be brought into our clinic unless this was absolutely necessary. These visits have been done with her approval as well as her husband. We have been using silver alginate to the areas on the right mid tibia and right lateral lower leg. 7/27 TELEHEALTH; patient was seen along with her husband today via telehealth. 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 We changed her to sample last visit. She has 2 areas a chronic wound on her right mid tibia  and one just above her ankle. These were not the original wounds when she came into this clinic but she developed them during treatment 8/17; she comes in for her first face-to-face visit in a long period. She has a remaining area just medial to the right tibia which is the last open part of her large wound across this area. She also has an area on the right lateral lower leg. We prescribed Santyl last telehealth visit but they were concerned that this was making a deeper so they put silver alginate on it last week. Her husband changes the dressings. 8/31; using Santyl to the 2 wound areas some improvement in wound surfaces. Husband has surgery in 2 weeks we will put her out 3 weeks. Any of the advanced treatment options that I can think of that would be eligible for this wound would also cause her to have to come in weekly. The risk that the patient is just too high 9/21. Using Santyl to the 2 wound areas. Both of these are somewhat better although the medial mid tibia area still has exposed muscle. Lateral ankle requiring debridement. Using Santyl 10/12; using Santyl to the 2 wound areas. One on the right lateral ankle and the other in the medial calf which still has exposed muscle. Both areas have come down in size and have better looking surfaces. She has made nice progress with santyl 11/2; we have been using Santyl to the 2 wound areas. Right lateral ankle and the other in the mid tibia area the medial part of this still has exposed muscle. 11/23/2019 on evaluation today patient appears to be doing about the same really with regard to her wounds. She is actually not very pleased with how things seem to be progressing at this point she tells me that she really has not noted much improvement unfortunately. With that being said there is no signs of active infection at this time. There is some slough buildup noted at this time which again along with some dry skin around the edges of the wound I  think would benefit her to try to debride some of this away. Fortunately her pain is doing fairly well. She still has exposed muscle in the right medial/tibial area. 12/14; TELEHEALTH; she was changed to [facility] to the right calf wound and right lateral ankle wound when she was here last time. Unfortunately since then she had a fall with a pelvic fracture and a fracture of her wrist. She was apparently hospitalized for 5 days. I have not looked at her discharge summary. She apparently came out of the hospital with a blister on her right heel. She was seen today via telehealth by myself and our case manager. The patient and her husband were present. She has been using Hydrofera Blue at our direction from the last time she was in the clinic. There  is been no major improvement in fact the areas appear deeper and with a less viable surface 01/04/2020; TELEHEALTH; the patient was seen today in accompaniment of her husband and our nurse. She has 3 open areas 1 on the right medial mid tibia, one on the right lateral ankle and a large eschared pressure area on the right heel. We have been using Iodoflex to the 2 original wounds. 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 It is simply too perilous for her to be seen in any other way 1/26; TELEHEALTH; the patient was seen today in accompaniment of her husband and our RN one of our nurses. She still has the 3 open areas 1 on the right medial mid tibia which is the remanent of a more extensive wound in this area, one on the right lateral ankle and a large eschared pressure area on the right heel. We have been using Iodoflex to the 2 original wounds and a bed at 9 application to the eschared area on the heel 2/15; the first time we have seen this patient and then several months out of concern for the pandemic. She had a large horizontal wound in the mid tibia. Only the medial aspect of this is still open. Area on the lateral  ankle is just about closed. She had a new pressure ulcer on the right heel which I have removed some of the eschar. We have been using Iodoflex which I will continue. The area in the mid tibia has a round circle in the middle of exposed muscle. I think we would have to use an advanced treatment product to stimulate granulation over this area. We will run this through her insurance. She is not eligible for plastic surgery for many reasons 3/1; TELEHEALTH. The patient was seen today by telehealth. 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 We have been using Iodoflex to the wound areas which include the right anterior mid tibia, right lateral ankle and the right heel. All of these were reviewed 3/8; the patient's wound just above the right ankle is closed. She still has a contracting black eschar on the heel where she had a pressure ulcer. The medial part of her original wound on the mid tibia has exposed muscle. We have made really made no progress in this area although we have managed to get a lot of the original wound in this area to close. We used Apligraf #1 today 3/22; the patient's ankle wound remains closed. She still has a contracting black eschar on the heel although it seems to have less surface area. It still not clear whether there is any depth here. We used Apligraf #2 today in an attempt to get granulation over the exposed muscle and what is left of her mid tibia area. 4/5; everything is closed except the medial aspect of the mid tibia wound, as well as the pressure injury on the right heel. She has been using Betadine to the right heel which has been gradually contracting. We used Apligraf #3 today. 4/19; Apligraf #4. Still a pressure area on the right heel she has been using Betadine superficial excoriated areas she has been applying salicylic acid to on the anterior leg and the thick horn  on the leg just above her wound area 5/3; Apligraf #5. Unfortunately she came in today with a reopening of the area on the right lateral lower leg. Not much  change in the wound we have been treating in the mid calf. Quite a bit of edema surrounding this wound. She reports that she was up on her feet quite a bit. The area on the right heel is separating she has been using Betadine She comes in with a new wound on the left lateral malleolus which is very disappointing this is been open since last week 5/17; we applied her last Apligraf last time. Unfortunately that did not have any effect on the deep area medially in the mid tibial area. However the area over the right lateral malleolus is a lot better. Right heel also is contracted we used Iodoflex last time. The new wound on the left lateral malleolus were using Santyl to. This required debridement. 6/1. Not much change in the area on the right medial mid tibia. Right lateral ankle is improved she has the necrotic area on the right heel which was a pressure area. New area on the left lateral malleolus from last time. I changed her to Iodoflex to the area on the left lateral malleolus she said this hurt I have put her back on Santyl 6/15; right mid tibia is unchanged. I do not think there is anything I can do to this topically that we will get this to granulate over the muscle. And I am furthermore I am not sure that she is a surgical candidate either because of her severe pulmonary issues or because she really does not want to go through it. The area on the right lateral malleolus is a small punched out area. The area on the left lateral malleolus again small punched out with nonviable surface Pressure ulcer on the right heel at separating black eschar. I went ahead and remove this today she has an area on the left anterior mid tibia just laterally. Geographic wounds debris on the surface. Some erythema here. I think this is developing because of  chronic venous/lymphedema. There is skin changes widely Change the primary dressing to Sorbact to all wound areas. She needs compression on the left leg as well as the right 6/21; right mid tibia which was her one remaining wound at 1 point is unchanged The area on the right lateral malleolus actually is close to closing over still a small punched out area The area on the left lateral malleolus again a deep wound it is this 1 that she feels pain in. Pressure ulcer on the right heel was cultured today. New area on the left anterior mid tibia several geographic wounds last week in the setting of chronic venous insufficiency this actually looks some better 7/6; Right mid tibia about the same. Exposed muscle Left lateral malleolus again necrotic debris over the surface. Pressure ulcer on the right heel. She finished the Keflex we prescribed. Necrotic debris debrided with a #3 curette The rest of her wounds on the right mid tibia right lateral ankle are better Her husband reminds me that she had a right femoral endarterectomy and has 2 stents in her left thigh placed in 2011 approximately by vascular surgery in [place]. She saw Dr. [name] last in February 2020. At that point he did not think that the arterial insufficiency she had on the right was sufficient to explain any of her right lower extremity wounds however she now has an area on the left lateral malleolus that looks like an ischemic wound 7/12; we have been using Sorbact all wound areas Right mid tibia about the same exposed muscle Left lateral malleolus small  wound with debris in the surface punched out Pressure ulcer of the right heel requiring debridement of necrotic debris Lateral ankle wound on the right is just about closed Right mid tibia wound about the same still with exposed muscle. 7/26 really no change in any wounds. We have been using Sorbact. She has bilateral lower extremity wounds. This is in the setting of scleroderma,  severe venous hypertension. She also has known PAD and I had really hoped to be able to get a review by Dr. [name] but apparently that is not booked until August 31. I have asked her to call back to vein and vascular and get an appointment with somebody a little earlier if that is possible. 8/16; patient's area on the right ankle which was a chronic wound is closed over again. The area on the right mid lower leg, right heel left lateral ankle are all about the same. Pressure ulcer on the right heel is still not viable. New areas identified on the right lateral calf. She has a follow-up with Dr. [name] on 8/31. Phenomenally I would like him to go over her arterial status with regards to her underlying stents. 9/13; patient had her ARTERIAL studies done however apparently Dr. [name] is now in [place] so she did not get an appointment with him on 8/31 and was not referred to any other provider. On the right side her PTA was monophasic. Noncompressible. TBI on the right at 0.41 on the left she was monophasic and noncompressible they did not do a TBI because she had a Band-Aid on her big toe. Patient does not describe claudication although she is having a lot of pain in the left lateral malleolus. 10/11; the patient had a right-sided interventions on 10/5. She had a angioplasty of the right anterior tibial artery as well as the tibioperoneal trunk/peroneal artery she had a drug-coated balloon angioplasty of the right superficial femoral artery. On the left she did not have any interventions yet but is going to have another look at this in 2 weeks. The superficial femoral artery on the left and the associated stent are patent popliteal artery is patent to the dominant vessel runoff is the anterior tibial which has several high-grade stenosis in the proximal one third. We have been using Sorbact. She also has a new area on the right upper mid tibia. 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 She tolerated this well. She is using Iodoflex and her husband is doing a kerlix Coban on her. She arrives in clinic today with the original medial part of her mid tibia wound with muscle exposure small area on the right ankle which was part of her original wound she has 2 punched-out areas laterally and 1 above her area anteriorly. Nonviable surfaces similarly the right heel is nonviable. On the left side she has an area on the left foot and the left lateral ankle similar nonviable surfaces. 11/8; patient arrives in clinic today with nothing improved. She has a multiple lightly group of wounds on the right lower leg presumably different etiologies including a skin biopsy, the original wound she had medially and some punched-out areas that are not of clear defined etiology. She says she was in a lot of pain this week could not have  her leg up on the bed did not get completely relief from dropping it over the bed. She also has a small area on the left lateral malleolus and the left lateral foot necrotic surfaces on all of these wounds. We have been using Iodoflex I attempted to put her in 3 layer compression last week that did not go well. We will be backing off on kerlix Coban this week. 11/15; the ultrasound that I sent the patient for last week showed findings consistent with acute deep vein thrombosis involving the right distal femoral vein and right popliteal vein. I was concerned about the combination of Plavix and Xarelto however apparently Dr. [name] felt that was not a problem. She is now going through the acute dosing of Xarelto. She has an appointment with Dr. Trula Slade on 12/5. 11/29; 10 days ago before he left for weeks vacation she called to report pain in  the right leg I sent her in some doxycycline her husband thought things were getting better however she required admission to hospital from 11/22 through 11/24 prompted by finding that her hemoglobin was 5.7 she was transfused 2 units of packed cells and her hemoglobin stabilized. She did undergo an endoscopy with findings of significant esophagitis part of which was candidal and part of it reflux. She was discharged on nystatin swish and swallow which she is doing. She seems to be better from that regard. There was no active other active source of bleeding Unfortunately the combination of Xarelto and Plavix was felt to be too much for her. The Xarelto was stopped and an IVC filter was placed she is continued on Plavix. Her big complaint today is pain in the right calf really from the mid aspect superiorly. This is very tender slightly red but not particularly warm. She did see vein and vascular in the hospital I will see if I can pull their note 12/6; patient was at Dr. Stephens Shire office today. Great toe pressure on the right is 54 on the left 49 she has a 50 to 74% stenosis of the right mid popliteal on the left at 30 to 49% proximal SFA and a 50 to 74% CFA. She was treated with angioplasty of the right ATA, right TP trunk and peroneal artery as well as an angioplasty of the right SFA to popliteal artery. On 10/26 she underwent angioplasty of the left ATA and left TP trunk she has a history of a left common femoral endarterectomy and left superficial femoral artery stent as well as a right superficial femoral artery stent. In addition to her arterial disease she has significant venous disease with a history of a right greater saphenous vein ablation and a recent DVT in the right femoral and popliteal vein with insertion of an IVC filter. She has remained on Plavix but her anticoagulant was stopped. She continues to have a lot of swelling and pain in the right leg with significant erythema which I am  assuming is stasis dermatitis. She has had substantial wound areas on the right leg including right lateral a large necrotic wound with a smaller biopsy site above this. She has original wound on the left mid tibia with a small area above this as well. On the left she has a small area over the left lateral malleolus and the left lateral foot 12/22-week follow-up. Her right leg looks really terrible. She has another necrotic wound just above her original one on the right posterior leg. Greenish drainage suggestive of gram-negative/Pseudomonas infection. The entire  mid part of her leg is erythematous I think most of this is stasis dermatitis rather than cellulitis. She has also a bothersome ischemic-looking right fourth toe which I noticed today without her really complaining. She has not been systemically unwell although she looks more physically frail 12/27; I reached out to vein and vascular last weekend Dr. [name] will be seeing her on Thursday. She has a gangrenous area on the tip of the right fourth toe. Multiple areas breaking down. I think this is a combination of arterial and severe venous insufficiency she has multiple wounds in the right leg but not a lot of pain. She has 3 small areas on the left leg we have been using Santyl. On the right leg we have been using silver alginate or at least that is what they have been using Culture I did last week showed abundant is Pseudomonas aeruginosa had a few Enterococcus faecalis. I have been using Ceftin here predominantly because of the green drainage suggesting Pseudomonas I have not yet addressed the Enterococcus which was ampicillin sensitive 1/7; the patient was kindly seen by vein and vascular. She underwent a repeat ANGIOGRAM on 01/02/2021 by Dr. Donzetta Matters. She appears to have had bilateral common femoral treatments in the past. The right side is heavily heavily calcified and she has 1 area of 50% stenosis in the SFA that is nonflow limiting. She has  dominant runoff via the anterior tibial and peroneal arteries. On the left side she also has a heavily calcified SFA no flow-limiting stenosis in the popliteal artery then she she then has runoff via the anterior tibial and posterior tibial arteries without flow-limiting stenosis. She was not felt to require any intervention. She continues to have pain in the right leg with erythema although the erythema is somewhat better. Green drainage coming from the wounds on the posterior lateral right leg which are clearly necrotic. I have had her on 2-1/2 weeks of cefdinir this is while we awaited further arterial evaluation which is completed. I have now turned some thought to the status of her venous system particularly the DVT she had in November. This was an acute DVT involving the right distal femoral and right popliteal veins. She was then started on Xarelto in combination with Plavix but she required hospitalization in November with GI bleeding. She had an endoscopy by Dr. Lizbeth Bark of GI. This showed moderately severe esophagitis a combination of both food particles reflux and Candida. Localized mild inflammation characterized by erythema was found in the cardiac gastric fundus and the gastric body. The duodenal bulb's in the stages of the duodenum were normal. She then underwent a capsule endoscopy which showed evidence of oozing blood from the esophagus but no evidence of small bowel bleeding. She was not restarted on her Xarelto, she continues on Plavix and she has an Dentist) Signed: 01/12/2021 5:01:02 PM By: Linton Ham MD Entered By: Linton Ham on 01/10/2021 08:50:53 -------------------------------------------------------------------------------- Physical Exam Details Patient Name: Date of Service: [name] LO CK, [name] 01/09/2021 11:00 A M Medical Record Number: [id number] Patient Account Number: [id number] Date of Birth/Sex: Treating RN: 1951-06-28 (70 y.o. [name] Primary Care Provider: [name] Other Clinician: Referring Provider: Treating Provider/Extender: [name] in Treatment: 39 Constitutional Patient is hypertensive.. Pulse regular and within target range for patient.Marland Kitchen Respiratory status seems stable oxygen on. Temperature is normal and within the target range for the patient.Marland Kitchen Appears in no distress. Cardiovascular Palpable but  reduced dorsalis pedis pulses. Notes Wound exam; there are innumerable problems here. On the right side she has swelling and erythema in the right calf area. There is some tenderness although this has improved slightly. Innumerable number of wounds but most remarkably on the right posterior lateral calf both of which are necrotic. She has other areas on the malleoli, multiple areas anteriorly. Although her recent arterial studies were not requiring any intervention she still has gangrenous tip of the fourth toe On the left she has small punched-out areas on the left lateral foot left lateral malleolus and left lateral leg. All of these have necrotic surfaces. I debrided the left lateral foot and left lateral malleolus. Electronic Signature(s) Signed: 01/12/2021 5:01:02 PM By: Linton Ham MD Entered By: Linton Ham on 01/10/2021 08:54:10 -------------------------------------------------------------------------------- Physician Orders Details Patient Name: Date of Service: [name] LO CK, [name] 01/09/2021 11:00 A M Medical Record Number: [id number] Patient Account Number: [id number] Date of Birth/Sex: Treating RN: 13-Nov-1951 (69 y.o. [name] Primary Care Provider: [name] Other Clinician: Referring Provider: Treating Provider/Extender: [name] in Treatment: [phone] Verbal / Phone Orders: No Diagnosis Coding Follow-up Appointments ppointment in 1 week. - *****EXTRA TIME - 90 MINUTES***** Return A Bathing/ Shower/  Hygiene May shower and wash wound with soap and water. - on days that dressing is changed Edema Control - Lymphedema / SCD / Other Bilateral Lower Extremities Elevate legs to the level of the heart or above for 30 minutes daily and/or when sitting, a frequency of: - throughout the day Avoid standing for long periods of time. Exercise regularly Wound Treatment Wound #16 - Calcaneus Wound Laterality: Right Cleanser: Soap and Water 1 x Per Week/7 Days Discharge Instructions: May shower and wash wound with dial antibacterial soap and water prior to dressing change. Peri-Wound Care: Sween Lotion (Moisturizing lotion) 1 x Per Week/7 Days Discharge Instructions: Apply moisturizing lotion as directed Prim Dressing: Cutimed Sorbact Swab 1 x Per Week/7 Days ary Discharge Instructions: Apply to wound bed as instructed Secondary Dressing: Woven Gauze Sponge, Non-Sterile 4x4 in 1 x Per Week/7 Days Discharge Instructions: Apply over primary dressing as directed. Secondary Dressing: ABD Pad, 8x10 1 x Per Week/7 Days Discharge Instructions: Apply over primary dressing as directed. Compression Wrap: ThreePress (3 layer compression wrap) 1 x Per Week/7 Days Discharge Instructions: Apply three layer compression as directed. Wound #19 - Malleolus Wound Laterality: Right, Lateral Cleanser: Soap and Water 1 x Per Week/7 Days Discharge Instructions: May shower and wash wound with dial antibacterial soap and water prior to dressing change. Peri-Wound Care: Sween Lotion (Moisturizing lotion) 1 x Per Week/7 Days Discharge Instructions: Apply moisturizing lotion as directed Prim Dressing: Cutimed Sorbact Swab 1 x Per Week/7 Days ary Discharge Instructions: Apply to wound bed as instructed Secondary Dressing: Woven Gauze Sponge, Non-Sterile 4x4 in 1 x Per Week/7 Days Discharge Instructions: Apply over primary dressing as directed. Secondary Dressing: ABD Pad, 8x10 1 x Per Week/7 Days Discharge Instructions:  Apply over primary dressing as directed. Compression Wrap: ThreePress (3 layer compression wrap) 1 x Per Week/7 Days Discharge Instructions: Apply three layer compression as directed. Wound #20 - Malleolus Wound Laterality: Left, Lateral Cleanser: Soap and Water 1 x Per Week/7 Days Discharge Instructions: May shower and wash wound with dial antibacterial soap and water prior to dressing change. Peri-Wound Care: Sween Lotion (Moisturizing lotion) 1 x Per Week/7 Days Discharge Instructions: Apply moisturizing lotion as directed Prim Dressing: Cutimed Sorbact Swab 1 x Per  Week/7 Days ary Discharge Instructions: Apply to wound bed as instructed Secondary Dressing: Woven Gauze Sponge, Non-Sterile 4x4 in 1 x Per Week/7 Days Discharge Instructions: Apply over primary dressing as directed. Secondary Dressing: ABD Pad, 8x10 1 x Per Week/7 Days Discharge Instructions: Apply over primary dressing as directed. Compression Wrap: ThreePress (3 layer compression wrap) 1 x Per Week/7 Days Discharge Instructions: Apply three layer compression as directed. Wound #23 - Lower Leg Wound Laterality: Right, Posterior Cleanser: Soap and Water 1 x Per Week/7 Days Discharge Instructions: May shower and wash wound with dial antibacterial soap and water prior to dressing change. Peri-Wound Care: Sween Lotion (Moisturizing lotion) 1 x Per Week/7 Days Discharge Instructions: Apply moisturizing lotion as directed Prim Dressing: Cutimed Sorbact Swab 1 x Per Week/7 Days ary Discharge Instructions: Apply to wound bed as instructed Secondary Dressing: Woven Gauze Sponge, Non-Sterile 4x4 in 1 x Per Week/7 Days Discharge Instructions: Apply over primary dressing as directed. Secondary Dressing: ABD Pad, 8x10 1 x Per Week/7 Days Discharge Instructions: Apply over primary dressing as directed. Compression Wrap: ThreePress (3 layer compression wrap) 1 x Per Week/7 Days Discharge Instructions: Apply three layer compression as  directed. Wound #24 - Lower Leg Wound Laterality: Right, Anterior Cleanser: Soap and Water 1 x Per Week/7 Days Discharge Instructions: May shower and wash wound with dial antibacterial soap and water prior to dressing change. Peri-Wound Care: Sween Lotion (Moisturizing lotion) 1 x Per Week/7 Days Discharge Instructions: Apply moisturizing lotion as directed Prim Dressing: Cutimed Sorbact Swab 1 x Per Week/7 Days ary Discharge Instructions: Apply to wound bed as instructed Secondary Dressing: Woven Gauze Sponge, Non-Sterile 4x4 in 1 x Per Week/7 Days Discharge Instructions: Apply over primary dressing as directed. Secondary Dressing: ABD Pad, 8x10 1 x Per Week/7 Days Discharge Instructions: Apply over primary dressing as directed. Compression Wrap: ThreePress (3 layer compression wrap) 1 x Per Week/7 Days Discharge Instructions: Apply three layer compression as directed. Wound #25 - Foot Wound Laterality: Left, Lateral Cleanser: Soap and Water 1 x Per Week/7 Days Discharge Instructions: May shower and wash wound with dial antibacterial soap and water prior to dressing change. Peri-Wound Care: Sween Lotion (Moisturizing lotion) 1 x Per Week/7 Days Discharge Instructions: Apply moisturizing lotion as directed Prim Dressing: Cutimed Sorbact Swab 1 x Per Week/7 Days ary Discharge Instructions: Apply to wound bed as instructed Secondary Dressing: Woven Gauze Sponge, Non-Sterile 4x4 in 1 x Per Week/7 Days Discharge Instructions: Apply over primary dressing as directed. Secondary Dressing: ABD Pad, 8x10 1 x Per Week/7 Days Discharge Instructions: Apply over primary dressing as directed. Compression Wrap: ThreePress (3 layer compression wrap) 1 x Per Week/7 Days Discharge Instructions: Apply three layer compression as directed. Wound #26 - Lower Leg Wound Laterality: Right, Lateral Cleanser: Soap and Water 1 x Per Week/7 Days Discharge Instructions: May shower and wash wound with dial  antibacterial soap and water prior to dressing change. Peri-Wound Care: Sween Lotion (Moisturizing lotion) 1 x Per Week/7 Days Discharge Instructions: Apply moisturizing lotion as directed Prim Dressing: Cutimed Sorbact Swab 1 x Per Week/7 Days ary Discharge Instructions: Apply to wound bed as instructed Secondary Dressing: Woven Gauze Sponge, Non-Sterile 4x4 in 1 x Per Week/7 Days Discharge Instructions: Apply over primary dressing as directed. Secondary Dressing: ABD Pad, 8x10 1 x Per Week/7 Days Discharge Instructions: Apply over primary dressing as directed. Compression Wrap: ThreePress (3 layer compression wrap) 1 x Per Week/7 Days Discharge Instructions: Apply three layer compression as directed. Wound #27 - Lower Leg  Wound Laterality: Right, Anterior, Distal Cleanser: Soap and Water 1 x Per Week/7 Days Discharge Instructions: May shower and wash wound with dial antibacterial soap and water prior to dressing change. Peri-Wound Care: Sween Lotion (Moisturizing lotion) 1 x Per Week/7 Days Discharge Instructions: Apply moisturizing lotion as directed Prim Dressing: Cutimed Sorbact Swab 1 x Per Week/7 Days ary Discharge Instructions: Apply to wound bed as instructed Secondary Dressing: Woven Gauze Sponge, Non-Sterile 4x4 in 1 x Per Week/7 Days Discharge Instructions: Apply over primary dressing as directed. Secondary Dressing: ABD Pad, 8x10 1 x Per Week/7 Days Discharge Instructions: Apply over primary dressing as directed. Compression Wrap: ThreePress (3 layer compression wrap) 1 x Per Week/7 Days Discharge Instructions: Apply three layer compression as directed. Wound #28 - Lower Leg Wound Laterality: Left, Lateral Cleanser: Soap and Water 1 x Per Week/7 Days Discharge Instructions: May shower and wash wound with dial antibacterial soap and water prior to dressing change. Peri-Wound Care: Sween Lotion (Moisturizing lotion) 1 x Per Week/7 Days Discharge Instructions: Apply  moisturizing lotion as directed Prim Dressing: Cutimed Sorbact Swab 1 x Per Week/7 Days ary Discharge Instructions: Apply to wound bed as instructed Secondary Dressing: Woven Gauze Sponge, Non-Sterile 4x4 in 1 x Per Week/7 Days Discharge Instructions: Apply over primary dressing as directed. Secondary Dressing: ABD Pad, 8x10 1 x Per Week/7 Days Discharge Instructions: Apply over primary dressing as directed. Compression Wrap: ThreePress (3 layer compression wrap) 1 x Per Week/7 Days Discharge Instructions: Apply three layer compression as directed. Wound #29 - Lower Leg Wound Laterality: Right, Lateral, Proximal Cleanser: Soap and Water 1 x Per Week/7 Days Discharge Instructions: May shower and wash wound with dial antibacterial soap and water prior to dressing change. Peri-Wound Care: Sween Lotion (Moisturizing lotion) 1 x Per Week/7 Days Discharge Instructions: Apply moisturizing lotion as directed Prim Dressing: Cutimed Sorbact Swab 1 x Per Week/7 Days ary Discharge Instructions: Apply to wound bed as instructed Secondary Dressing: Woven Gauze Sponge, Non-Sterile 4x4 in 1 x Per Week/7 Days Discharge Instructions: Apply over primary dressing as directed. Secondary Dressing: ABD Pad, 8x10 1 x Per Week/7 Days Discharge Instructions: Apply over primary dressing as directed. Compression Wrap: ThreePress (3 layer compression wrap) 1 x Per Week/7 Days Discharge Instructions: Apply three layer compression as directed. Wound #30 - T Fourth oe Wound Laterality: Right Prim Dressing: KerraCel Ag Gelling Fiber Dressing, 4x5 in (silver alginate) Every Other Day/30 Days ary Discharge Instructions: Apply silver alginate to wound bed as instructed Secondary Dressing: Woven Gauze Sponges 2x2 in Every Other Day/30 Days Discharge Instructions: Apply over primary dressing as directed. Secured With: Child psychotherapist, Sterile 2x75 (in/in) Every Other Day/30 Days Discharge Instructions:  Secure with stretch gauze as directed. Secured With: Paper Tape, 1x10 (in/yd) Every Other Day/30 Days Discharge Instructions: Secure dressing with tape as directed. Wound #31 - Ankle Wound Laterality: Left, Medial Cleanser: Soap and Water 1 x Per Week/7 Days Discharge Instructions: May shower and wash wound with dial antibacterial soap and water prior to dressing change. Wound #32 - T Great oe Wound Laterality: Right Prim Dressing: KerraCel Ag Gelling Fiber Dressing, 4x5 in (silver alginate) ary Every Other Day/30 Days Discharge Instructions: Apply silver alginate to wound bed as instructed Secondary Dressing: Woven Gauze Sponges 2x2 in Every Other Day/30 Days Discharge Instructions: Apply over primary dressing as directed. Secured With: Child psychotherapist, Sterile 2x75 (in/in) Every Other Day/30 Days Discharge Instructions: Secure with stretch gauze as directed. Secured With: Paper Tape, 1x10 (in/yd)  Every Other Day/30 Days Discharge Instructions: Secure dressing with tape as directed. Electronic Signature(s) Signed: 01/09/2021 5:04:19 PM By: Levan Hurst RN, BSN Signed: 01/12/2021 5:01:02 PM By: Linton Ham MD Entered By: Levan Hurst on 01/09/2021 12:37:50 -------------------------------------------------------------------------------- Problem List Details Patient Name: Date of Service: [name] LO CK, [name] 01/09/2021 11:00 A M Medical Record Number: [id number] Patient Account Number: [id number] Date of Birth/Sex: Treating RN: 06/12/1951 (70 y.o. 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 02/24/2015 No Yes I87.331 Chronic venous hypertension (idiopathic) with ulcer and inflammation of right 10/04/2016 No Yes lower extremity L89.616 Pressure-induced deep tissue damage of right heel 12/14/2019 No Yes L97.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 05/31/2020 No Yes L97.528 Non-pressure chronic ulcer of other part of left foot with other specified 12/05/2020 No Yes severity I70.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 10/31/2020 No Yes lower leg I70.248 Atherosclerosis of native arteries of left leg with ulceration of other part of 10/31/2020 No Yes lower leg Inactive Problems ICD-10 Code Description Active Date Inactive Date 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 01/09/2021 11:00 A M Medical Record Number: [id number] Patient Account Number: [id number] Date of Birth/Sex: Treating RN: 1951/05/07 (70 y.o. [name] Primary Care Provider: [name] Other Clinician: Referring Provider: Treating Provider/Extender: [name] in Treatment: 459 Subjective History of Present Illness (HPI) this is a patient who initially came to [place] for wounds on the medial malleoli bilaterally as  well as her upper medial lower extremities bilaterally. These wounds eventually healed with assistance of Apligraf's bilaterally. While this was occurring she developed the current wound which opened into a fairly substantial wound on the right lower extremity. These are mostly secondary to venous stasis physiology however the patient also has underlying scleroderma, pulmonary hypertension. The wound has been making good progress lately with the Hydrofera Blue-based dressing. 03/17/2015; patient had a arterial evaluation a year ago. Her right ABI was 0.86 left was 1.0. T brachial index was 0.41 on the right 0.45 on the left. Her oe bilateral common femoral artery waveforms were triphasic. Her white popliteal posterior tibial artery and anterior tibial artery waveforms were monophasic with good amplitude. Luteal artery waveforms were biphasic it was felt that her bilateral great toe pressures are of normal although adequate for tissue healing. 03/24/2015. The condition of this wound is not really improved that. He was covered with as fibrinous surface slough and eschar. This underwent an aggressive debridement with both a curette and scalpel. I still cannot really get down to what I can would consider to be a viable surface.  There is no evidence of infection. Previous workup for ischemia roughly a year ago was negative nevertheless I think that continues to be a concern 04/07/15. The patient arrived for application of second Apligraf. Once again the surface of this wound is certainly less viable than I would like for an advanced treatment option. An aggressive debridement was done. She developed some arteriolar bleeding which required that along pressure and silver alginate. 04/21/15: change in the condition of this wound. Once again it is covered in a gelatinous surface slough. After debridement today surface of the wound looks somewhat better but now a heeling surface 05/05/15 Apligraf #4 applied.Still a  lot of slough on this wound. 05/19/15 Apligraf #5 applied. Still a lot of slough on this wound I did not aggressively remove this 06/02/15; continued copious amounts of surface slough. This debridement fairly easily. 06/08/2015 -- the last time she had a venous duplex study done was over 3 years ago and the surgery was prior to that. I have recommended that she sees Dr. early for a another opinion regarding a repeat venous duplex and possibly more endovenous ablation of vein stripping of micro-phlebotomies. 06/16/15; wound has a gelatinous surface eschar that the debridement fairly easily to a point. I don't disagree with the venous workup and perhaps even arterial re-evaluation. She is on prednisone 5 mg and continue his medications for her pulmonary artery hypertension I am not sure if the latter have any wound care healing issues I would need to investigate this. 06/23/15 continues with a gelatinous surface eschar with of fibrinous underlying. What I can see of this wound does not look unhealthy however I just can't get this material which I think is 2 different layers off. Empiric culture done 06/30/15; unfortunately not a lot of change in this wound. A gelatinous surface eschar is easily removed however it has a tight fibrinous surface underneath the. 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 The wound comes back and basically and unchanged state the. She has a gelatinous surface that is more easily removed however there is a tightly fibrinous surface underneath the. There is no evidence of infection. She has a vascular follow-up next month. I would have to inject her in order to do a more aggressive debridement of this area 07/21/15 the wound is roughly in the same state albeit the debridement is done with greater ease. There is less of the fibrinous eschar underneath the. There is no evidence of infection. She has follow-up with vascular surgery next week. No evidence of surrounding  infection. Her original distal wounds healed while this one formed. 07/28/15; wound is easier to debride. No wound erythema. She is seeing Dr. [name] next week. 08/18/15 Has been to [place] for repeat ablation. Have been using medihoney pad with some improvement 08/25/15; absolutely no change in the condition of this wound in either its overall size or surface condition in many months now. At one point I had this down to [place] healthier surface I think with [facility] however this did not actually progress towards closure. Do not believe that the wound has actually deteriorated in terms of volume at all. We have been using a medihoney pad which allows easier debridement of the gelatinous eschar but again no overall actual improvement. the patient is going towards an ablation with pain and vascular which I think is scheduled for next week. The only other investigation that I could first see would be a biopsy. She does  have underlying scleroderma 09/02/15 eschar is much easier to debridement however the base of this does not look particularly vibrant. We changed to Iodoflex. The patient had her ablation earlier this week 09/09/15 again the debridement over the base of this wound is easier and the base of the wound looks considerably better. We will continue the Iodoflex. Dr. Tawni Millers has expressed his satisfaction with the result of her ablation 09/15/15 once again the wound is relatively free of surface eschar. No debridement was done today. It has a pale-looking base to it. although this is not as deep as it once was it seems to be expanding especially inferiorly. She has had recent venous ablation but this is no closer to healing.I've gone ahead and done a punch biopsy this from the inferior part of the wound close to normal skin 09/22/15: the wound is relatively free of surface eschar. There is some surrounding eschar. I'm not exactly sure at what level the surface is that I am seeing. 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 No evidence of atypical infection, malignancy. 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 No debridement was done 09/29/15; the wound surface is relatively free of surface eschar. There is eschar surrounding the walls of the wound. Aside from the improvement in the amount of surface slough. This wound has not progressed any towards closure. There is not even a surface that looks like there at this is ready. There is no evidence of any infection nor maligancy based on biopsy I did on 9/15. I continued with the Iodoflex however I am looking towards some alternative to try to promote some closure or filling in of this surface. Consider triple layer Oasis. Collagen did not result in adequate control of the surface slough 10/13/15; the patient was in hospital last week with severe anemia. The wound looks somewhat better after debridement although there is widening medially. There is no evidence of infection. 10/20/15; patient's wound on the right lateral lower leg is essentially unchanged. This underwent a light surface debridement and in general the debridement is easier and the surface looks improved. I noted in doing this on the side of the wound what appears to be a piece of calcium deposition. The patient noted that she had previous things on the tips of her fingers. 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 She follows with rheumatology and I have asked them to talk to her about this. In view of that S the nonhealing ulcer may have something to do with calcinosis and also unrelieved Raynaud's disease in this area. I should note that her original wounds on the right and left medial malleolus and the inner aspects of both legs just below the knees did however heal over 10/30/15; the patient's wound on the right lateral lower leg is essentially  unchanged. I was able to remove some calcified material from the medial wound edge. 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 Otherwise the wound appears essentially unchanged there is less adherent surface eschar. Some of the calcified material was sent to pathology for analysis 11/17/15. The patient's wound on the right lateral leg is essentially unchanged. Wider Medially. 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 There is some surface slough but I did not debridement this. No evidence of surrounding infection. 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 This will make healing difficult 12/15/15; the wound has less depth surface has a fibrinous slough and calcifications in the wound edge no evidence of infection 12/22/15; the wound definitely has less Fibrinous slough on the base. Calcifications around the wound edges are still evident. Although the wound bed looks healthier it is still pale in appearance. Previous biopsy did not show malignancy 01/04/15; surgical debridement of nonviable slough and subcutaneous tissue the wound cleans up quite nicely but appears to be expanding outward calcifications around the wound edges are still there. 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 She is to see her rheumatologist I'll see if he has any opinions on this. My punch biopsy done in September did not show calcifications although these are clearly evident. 01/19/16 light selective debridement of nonviable surface slough. There is epithelialization medially. This gives me reason for cautious optimism. She has been to see her  rheumatologist, there is nothing that can be done for this type of soft tissue calcification associated with scleroderma 02/02/16 no debridement although there is a light surface slough. She has 2 peninsulas of skin 1 inferiorly and one medially. We continue to make a slight and slow but definite progressive here 02/16/16; light surface debridement with more attention to the circumference of the wound bed where the fibrinous eschar is more prevalent. No calcifications detected. She seems to have done nicely with the [facility] with some epithelialization and some improvement in the overall wound volume. 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 Continued attention to the circumference of the wound where the fibrinous eschar in calcinosis or prevalent. No calcifications were detected. She has continued improvement with Hydrofera Blue. The wound is no longer as deep 03/15/16 surgical debridement done to remove surface escha Especially around the circumference of the wound where there is nonviable subcutaneous tissue. In spite of this there is considerable improvement in the overall dimensions and depth of the wound. Islands of epithelialization are seen especially medially inferiorly and superiorly to a lesser extent. She is using Hydrofera Blue at home 03/29/16; surgical debridement done to remove surface eschar and nonviable subcutaneous tissue. This cleans up quite nicely mention slightly larger in terms of length and width however depth is less 04/12/16; continued gradual improvement in terms of depth and the condition of the wound base. Debridement is done. Continuing long standing Hydrofera Blue at home with Kerlix Coban wraps 04/26/16; continued gradual improvement in terms of depth and management as well as condition of the wound base. Surgical debridement done she  continues with Hydrofera Blue. 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 She initially came to this clinic venous insufficiency ulcers which have long since healed 05/17/16 continued improvement in terms of the depth and measurements of this wound although she has a tightly adherent fibrinous slough each time. We've been continued with long standing Hydrofera Blue which seems to done as well for this wound is many advanced treatment options. 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 She also has chronic venous insufficiency. She has an irritation on her lateral right ankle secondary to our wraps 05/10/16; wound  appears to be smaller especially on the medial aspect and especially in the width. Wound was debridement surface looks better. She is also been in the hospital apparently with anemia again she tells me she had an endoscopy. Since she got home after 3 days which I believe was sometime last week she has had an irritated painful area on the right lateral ankle surrounding the lateral malleolus 05/31/16; much more adherent surface slough today then recently although I don't think the dimensions of changed that much. A more aggressive debridement is required. The irritated area over her medial malleolus is more pruritic and painful and I don't think represents cellulitis 06/14/16; no major change in her wound dimensions however there is more tightly adherent surface slough which is disappointing. As well as she appears to have a new small area medially. Furthermore an irritated uncomfortable area on the lateral aspect of her right foot just below the lateral malleolus. 06/21/16; I'm seeing the patient back in follow-up for the new areas under her major wound on the right anterior leg. She has been using Hydrofera Blue to this area probably for several months now and although the dressings seem to be helping for quite a period of  time I think things have stagnated lately. She comes in today with a relatively tight adherent surface slough and really no changes in the wound shape or dimension. The 2 small areas she had inferiorly are tiny but still open they seem improved this well. There is no uncontrolled edema and I don't think there is any evidence of cellulitis. 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 Patient has had vascular evaluations both venous and arterial. I have biopsied this area. There is no obvious infection. The worrisome thing today is that she seems to be developing areas of erythema and epithelial damage on the medial aspect of the right foot. Also to a lesser degree inferior to the actual wound itself. Again I see no obvious changes to suggest cellulitis however as this is the only treatable option I will probably give her antibiotics. 07/13/16 no major change in the lady's large anterior right leg wound. Still covered with a very tightly adherent surface slough which is difficult to debridement. There is less erythema around this, culture last week grew pseudomonas I gave her ciprofloxacin. The area on her lateral right malleolus looks better- 08/02/16 the patient's wounds continued to decline. Her original large anterior right leg wound looks deeper. Still adherent surface slough that is difficult to debridement. She has a small area just below this and a punched-out wound just below her lateral malleolus. In the meantime she is been in hospital with apparently an upper GI bleed on Plavix and aspirin. She is now just on Plavix she received 3 units of packed cells 08/23/16; since I last saw this 3 weeks ago, the open large area on her right leg looks about the same syrup. She has a small satellite lesion just underneath this. The area on her medial right ankle is now a deep necrotic wound. I attempted  to debridement this however there is just too much pain. 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 She follows with vascular surgery and is scheduled for an angiogram in early September 09/06/16; the patient is going for an arteriogram tomorrow. Her original large wound  on the right calf is about the same the satellite lesion underneath it is about the same however the area on her medial ankle is now deeper with exposed tendon. I am no longer attempting to debride these wounds 09/20/16; the patient has undergone a right femoral endarterectomy and Dacron patch angioplasty. This seems to have helped the flow in her right leg. 10/04/16; Arrived today for aggressive debridement of the wounds on her right calf the original wound the one beneath it and a difficult area over her right lateral leg just above the lateral malleolus. 10/25/16; her 3 open wounds are about the same in terms of dimensions however the surface appears a lot healthier post debridement. Using Iodoflex 10/18/16 we have been using Iodoflex to her wounds which she tolerated with some difficulty. 10/11/16; has been using Santyl for a period of time with some improvement although again very adherent surface slough would prevent any attempted healing this. She has a original wound on the left calf, the satellite underneath that and the most recent wound on the right medial ankle. She has completed revascularization by Dr. early and has had venous ablation earlier. Want to go back to Iodoflex to see if week and get a healthier surface to this wound bed failing this I think she'll need to be taken to the OR and I am prepared to call Dr. Marla Roe to discuss this. She is obviously not a good candidate for general anesthesia however.; 11/08/16; I put her on Iodoflex last time to see if I can get the wound bed any healthier and unfortunately today still had tremendous surface  slough. 11/15/16; 4 weeks' worth of Iodoflex with not much improvement. Debridement on the major wounds on her left anterior leg is easier however this does not maintain from week to week. The punched-out area on her medial right ankle 11/29/16; I attempted to change the patient last visit to [facility] however she states this burned and was very uncomfortable therefore we gave her permission to go back to the eye out of complex which she already had at home. Also she noted a lot of pain and swelling on the lateral aspect of her leg before she traveled to [facility] for the holidays, I called her in doxycycline over the phone. This seems to have helped 12/06/16; Wounds unchanged by in large. Using Iodoflex 12/13/16; her wounds today actually looks somewhat better. The area on the right lateral lower leg has reasonably healthy-looking granulation and perhaps as actually filled and a bit. Debridement of the 2 wounds on the medial calf is easier and post debridement appears to have a healthier base. We have referred her to Dr. Migdalia Dk for consideration of operative debridement 12/20/16; we have a quick note from Dr. Merri Ray who feels that the patient needs to be referred to an academic center/plastic surgeon. This is due to the complexity of the patient's medical issues as it applies to anything in the OR. We have been using Iodoflex 12/27/16; in general the wounds on the right leg are better in terms of the difficult to remove surface slough. She has been using Iodoflex. She is approved for Apligraf which I anticipated ordering in the next week or 2 when we get a better-looking surface 01/04/16 the deep wounds on the right leg generally look better. Both of them are debrided further surface slough. The area on the lateral right leg was not debrided. She is approved for Apligraf I think I'll probably order this either next  week or the week after depending on the surface of the  wounds superiorly. We have been using Iodoflex which will continue until then 01/11/16; the deep wounds on the right leg again have a surface slough that requires debridement. I've not been able to get the wound bed on either one of these wounds down to what would be acceptable for an advanced skin stab-like Apligraf. The area on the medial leg has been improving. We have been using hot Iodoflex to all wounds which seems to do the best at at least limiting the nonviable surface 01/24/17; we have continued Iodoflex and all her wound areas. Her debridement Gen. he is easier and the surface underneath this looks viable. Nevertheless these are large area wounds with exposed muscle at least on the anterior parts. We have ordered Apligraf's for 2 weeks from now. The patient will be away next week 02/07/17; the patient was close to have first Apligraf today however we did not order one. I therefore replaced her Iodoflex. She essentially has 3 large punched- out areas on her right anterior leg and right medial ankle. 02/11/17; Apligraf #1 02/25/17; Apligraf #2. In general some improvement in the right medial ankle and right anterior leg wounds. The larger wound medially perhaps some better 03/11/17; Apligraf #3. In general continued improvement in the right medial ankle and the right anterior leg wounds. 03/25/17 Apligraf #4. In general continued improvement especially on the right medial ankle and the lower 04/08/17; Apligraf #5 in general continued improvement in all wound sites. 04/22/17; post Apligraf #5 her wound beds continued to look a lot better all of them up to the surface of the surrounding skin. Had a caramel-colored slough that I did not debrided in case there is residual Apligraf effect. The wounds are as good as I have seen these looking quite some time. 04/29/17; we applied [place] and last week after completing her Apligraf. Wounds look as though they've contracted somewhat although they have  a nonviable surface which was problematic in the past. Apparently she has been approved for further Apligraf's. We applied Iodosorb today after debridement. 05/06/17; we're fortunate enough today to be able to apply additional Apligraf approved by her insurance. In general all of her wounds look better Apligraf #6 05/24/17; Apligraf #7 continued improvement in all wounds o3 06/10/17 Apligraf #8. Continued improvement in the surface of all wounds. Not much of an improvement in dimensions as I might a follow 06/24/17 Apligraf #9 continued general improvement although not as much change in the wound areas I might of like. She has a new open area on the right anterior lateral ankle very small and superficial. She also has a necrotic wound on the tip of her right index finger probably secondary to severe uncontrolled Raynaud's phenomenon. She is already on sildenafil and already seen her rheumatologist who gave her Keflex. 07/08/17; Apligraf #10. Generalized improvement although she has a small additional wound just medial to the major wound area. 07/22/17; after discussion we decided not to reorder any further Apligraf's although there is been considerable improvement with these it hasn't been recently. The major wound anteriorly looks better. Smaller wounds beneath this and the more recent one and laterally look about the same. The area on the right lateral lower leg looks about the same 07/29/17 this is a patient who is exceedingly complex. She has advanced scleroderma, crest syndrome including calcinosis, PAD status post revascularization, chronic venous insufficiency status post ablations. She initially presented to this clinic with wounds  on her bilateral lower legs however these closed. More recently we have been dealing with a large open area superiorly on the right anterior leg, a smaller wound underneath this and then another one on the right medial lower extremity. These improve significantly with 10  Apligraf applications. Over the last 2-3 weeks we are making good progress with Hydrofera Blue and these seem to be making progress towards closure 08/19/17; wounds continued to make good improvement with Hydrofera Blue and episodic aggressive debridements 08/26/17; still using Hydrofera Blue. Good improvements 09/09/17; using Hydrofera Blue continued improvement. Area on the lateral part of her right leg has only a small remaining open area. The small inferior satellite region is for all intents and purposes closed. Her major wound also is come in in terms of depth and has advancing epithelialization. 09/16/17; using Hydrofera Blue with continued improvement. The smaller satellite wound. We've closed out today along with a new were satellite wound medially. The area on her medial ankle is still open and her major wound is still open but making improvement. All using Hydrofera Blue. Currently 09/30/17; using Hydrofera Blue. Still a small open area on the lateral right ankle area and her original major wound seems to be making gradual and steady improvement. 10/14/17; still using Hydrofera Blue. Still too small open areas on the right lateral ankle. Her original major wound is horizontal and linear. The most problematic area paradoxically seems to be the area to the medial area wears I thought it would be the lateral. 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 10/28/17; still using Hydrofera Blue. Right lateral ankle has a very small open area superiorly on the most lateral part of the wound. Her original open wound has 2 open areas now separated by normal skin and we've redefined this. 11/11/17; still using Hydrofera Blue area and right lateral ankle continues to have a small open area on mostly the lateral part of the wound. Her original wound has 2 small open areas now separated by a considerable amount of normal skin 11/28/17; the patient called in  slightly before Thanksgiving to report pain and erythema above the wound on the right leg. In the past this is responded well to treatment for cellulitis and I gave her over the phone doxycycline. She stated this resulted in fairly abrupt improvement. We have been using Hydrofera Blue for a prolonged period of time to the larger wound anteriorly into the remaining wound on her right right lateral ankle. The latter is just about closed with only a small linear area and the bottom of the [place]. 12/02/17; use endoform and left the dressing on since last visit. There is no tenderness and no evidence of infection. 12/16/17; patient has been using Endoform but not making much progress. The 2 punched out open areas anteriorly which were the reminiscence of her major wound appear deeper. The area on the lateral aspect of her right calf also appears deeper. Also she has a puzzling tender swelling above her wound on the right leg. This seems larger than last time. Just above her wounds there appears to be some fluctuance in this area it is not erythematous and there is no crepitus 12/30/17; patient has been using Endoform up until last week we used Hydrofera Blue. Ultrasound of the swelling above her 2 major wounds last time was negative for a fluid collection. I gave her cefaclor for the erythema and tenderness in this area which seems better. Unfortunately  both punched out areas anteriorly and the area on her right medial lower leg appear deeper. In fact the lateral of the wounds anteriorly actually looks as though it has exposed tendon and/or muscle sheath. She is not systemically unwell. She is complaining of vaginitis type symptoms presumably Candida from her antibiotics. 01/06/18; we're using santyl. she has 2 punched out areas anteriorly which were initially part of a large wound. Unfortunately medially this is now open to tendon/muscle. All the wounds have the same adherent very difficult to debride  surface. 01/20/18; 2 week follow-up using Santyl. She has the 2 punched out areas anteriorly which were initially part of her large surface wound there. Medially this still has exposed muscle. All of these have the same tightly adherent necrotic surface which requires debridement. PuraPly was not accepted by the patient's insurance however her insurance I think it changed therefore we are going to run Apligraf to gain 02/03/18; the patient has been using Santyl. The wound on the right lateral ankle looks improved and the 2 areas anteriorly on the right leg looks about the same. The medial one has exposed muscle. The lateral 1 requiresdebridement. We use PuraPly today for the first week 02/10/18; PuraPly #2. The patient has 3 wounds. The area on the right lateral ankle, 2 areas anteriorly that were part of her original large wound in this area the medial one has exposed muscle. All of the wounds were lightly debrided with a number 3 curet. PuraPly #2 applied the lateral wound on the calf and the right lateral ankle look better. 02/17/18; PuraPly #3. Patient has 3 wounds. The area on the right lateral ankle in 2 areas internally that were part of her original large wound. The lateral area has exposed muscle. She arrived with some complaints of pain around the right ankle. 02/24/18; PuraPly #4; not much change in any of the 3 wound areas. Right lateral ankle, right lateral calf. Both of these required debridement with a #3 curet. She tolerates this marginally. The area on the medial leg still has exposed muscle. Not much change in dimensions 03/03/18 PuraPly #5. The area on the medial ankle actually looks better however the 2 separate areas that were original parts of the larger right anterior leg wound look as though they're attempting to coalesce. 03/10/18; PuraPly #6. The area on the medial ankle actually continues to look and measures smaller however the 2 separate areas that were part of the original large  wound on the right anterior leg have now coalesced. There hasn't been much improvement here. The lateral area actually has underlying exposed muscle 03/17/18-she is here in follow-up evaluation for ulcerations to the right lower extremity. She is voicing no complaints or concerns. She tolerated debridement. Puraply#7 placed 03/24/18; difficult right lower extremity ulcerations. PuraPly #8 place. She is been approved for [employer]. She did very well with Apligraf today however she is apparently reached her "lifetime max" 03/31/18; marginal improvement with PuraPly although her wounds looked as good as they have in several weeks today. Used TheraSkins #1 04/14/18 TheraSkin #2 today 04/28/18 TheraSkins #3. Wound slightly improved 05/12/18; TheraSkin skin #4. Wound response has been variable. 05/27/18 TheraSkin #5. Generally improvement in all wound areas. I've also put her in 3 layer compression to help with the severe venous hypertension 06/09/18; patient is done quite well with the TheraSkins unfortunately we have no further applications. I also put her in 3 layer compression last week and that really seems to of helped. 06/16/18; we have  been using silver collagen. Wounds are smaller. Still be open area to the muscle layer of her calf however even that is contracted somewhat. She tells me that at night sometimes she has pain on the right lateral calf at the site of her lower wound. Notable that I put her into 3 layer compression about 3 weeks ago. She states that she dangles her leg over the bed that makes it feel better but she does not describe claudication during the day ooShe is going to call her secondary insurance to see if they will continue to cover advanced treatment products I have reviewed her arterial studies from 01/22/17; this showed an ABI in the right of 1 and on the left noncompressible. TBI on the right at 0.30 on the left at 0.34. It is therefore possible she has significant PAD with  medial calcification falsely elevating her ABI into the normal range. I'll need to be careful about asking her about this next week it's possible the 3 layer compression is too much 06/23/18; was able to reapply TheraSkin 1 today. Edema control is good and she is not complaining of pain no claudication 07/07/18;no major change. New wound which was apparently a taper removal injury today in our clinic between her 2 wounds on the right calf TheraSkins #2 07/14/18; I think there is some improvement in the right lateral ankle and the medial part of her wound. There is still exposed muscle medially. 07/28/18; two-week follow-up. TheraSkins#3. Unfortunately no major change. She is not a candidate I don't think for skin grafting due to severe venous hypertension associated with her scleroderma and pulmonary hypertension 08/11/18 Patient is here today for her [employer] application #86 (#5 of the second set). She seems to be doing well and in the base of the wound appears to show some progress at this point. This is the last approved [employer] of the second set. 08/25/18; she has completed TheraSkin. There has been some improvement on the right lateral calf wound as well as the anterior leg wounds. The open area to muscle medially on the anterior leg wound is smaller. I'm going to transition her back to [facility] under Kerlix Coban change every second day. She reports that she had some calcification removed from her right upper arm. We have had previous problems with calcifications in her wounds on her legs but that has not happened recently 09/08/18;using Hydrofera Blue on both her wound areas. Wounds seem to of contracted nicely. She uses Kerlix Coban wrap and changes at home herself 09/22/2018; using Hydrofera Blue on both her wound areas. Dimensions seem to have come down somewhat. There is certainly less depth in the medial part of the mid tibia wound and I do not think there is any exposed muscle at this  point. 10/06/2018; 2-week follow-up. Using [facility] on her wound areas. Dimensions have come down nicely both on the right lateral ankle area in the right mid tibial area. She has no new complaints 10/20/2018; 2-week follow-up. She is using Hydrofera Blue. Not much change from the last time she was here. The area on the lateral ankle has less depth although it has raised edges on one side. I attempted to remove as much of the raised edge as I could without creating more additional wounds. The area on the right anterior mid tibia area looks the same. 11/03/2018; 2-week follow-up she is using Hydrofera Blue. On the right anterior leg she now has 2 wounds separated by a large area of normal  skin. The area on the medial part still has I think exposed muscle although this area itself is a lot smaller. The area laterally has some depth. Both areas with necrotic debris. ooThe area on her right lateral ankle has come in nicely 11/17/2018; patient continues to use Hydrofera Blue. We have been increasing separation of the 2 wounds anteriorly which were at one point joint the area on the right lateral calf continues to have I think some improvement in depth. 12/08/2018; patient continues to use Hydrofera Blue. There is some improvement in the area on the right lateral calf. The 2 areas that were initially part of the original large wound in the mid right tibia are probably about the same. In fact the medial area is probably somewhat larger. We will run puraply through the patient's insurance 12/22/2018; she has been using Hydrofera Blue. We have small wounds on the right lateral calf and 2 small areas that were initially part of a large wound in the right mid tibia. We applied pure apply #1 today. 12/29/2018; we applied puraply #2. Her wounds look somewhat better especially on the right lateral calf and the lateral part of her original wound in the mid tibial area. 01/05/2019; perhaps slightly improved in  terms of wound bed condition but certainly not as much improvement as I might of liked. Puraply #3 1/13: we did not have a correct sized puraply to apply. wounds more pinched out looking, I increased her compression to 3 layer last week to help with significant multilevel venous hypertension. Since then I've reviewed her arterial status. She has a right femoral endarterectomy and a distal left SFA stent. She was being followed by Dr. [name] for a period however she does not appear to have seen him in 3 years. I will set up an appointment. 1/20. The patient has an additional wound on the right lateral calf between the distal wound and proximal wounds. We did not have Puraply last week. Still does not have a follow-up with Dr. early 1/27: Follow-up with Dr. early has been arranged apparently with follow-up noninvasive studies. Wounds are measuring roughly the same although they certainly look smaller 2/3; the patient had non invasive studies. Her ABIs on the right were 0.83 and on the left 1.02 however there was no great toe pressure bilaterally. Also worrisome monophasic waveforms at the PTA and dorsalis pedis. We are still using Puraply. We have had some improvement in all of the wounds especially the lateral part of the mid tibial area. 2/10; sees Dr. early of vein and vascular re-arterial studies next week. Puraply reapplied today. 2/17; Dr. early of vein and vascular his appointment is tomorrow. Puraply reapplied after debridement of all wounds 2/24; the patient saw Dr. early I reviewed his note. sHe noted the previous right femoral endarterectomy with a Dacron patch. He also noted the normal ABI and the monophasic waveforms suggesting tibial disease. Overall he did not feel that she had any evidence of arterial insufficiency that would impair her wound healing. He did note her venous disease as well. He suggested PRN follow-up. 3/2; I had the last puraply applied today. The original wounds over  the mid tibia area are improved where is the area on the right lower leg is not 3/9; wounds are smaller especially in the right mid tibia perhaps slightly in the right lateral calf. We finished with puraply and went to endoform today 3/23; the patient arrives after 2 weeks. She has been using endoform. I think all  of her wounds look slightly better which includes the area on the right lateral calf just above the right lateral malleolus and the 2 in the right mid tibia which were initially part of the same wound. 4/27; TELEHEALTH visit; the patient was seen for telehealth visit today with her consent in the middle of the worldwide epidemic. Since she was last here she called in for antibiotics with pain and tenderness around the area on the right medial ankle. I gave her empiric doxycycline. She states this feels better. She is using endoform on both of these areas 5/11 TELEHEALTH; the patient was seen for telehealth visit today. She was accompanied at home by her husband. She has severe pulmonary hypertension accompanied scleroderma and in the face of the Covid epidemic cannot be safely brought into our clinic. We have been using endoform on her wound areas. There are essentially 3 wound areas now in the left mid tibia now 2 open areas that it one point were connected and one on the right lateral ankle just above the malleolus. 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 She cannot come to clinic. The wounds were reviewed today via telehealth. She has severe chronic venous hypertension which I think is centrally mediated. She has wounds on her right anterior tibia and right lateral ankle area. These are chronic. She has been using endoform. 6/8; TELEHEALTH; this is a patient with severe pulmonary hypertension secondary to  scleroderma on chronic oxygen she cannot come to the clinic in the face of the Covid epidemic. We have been following her from telehealth. She has severe chronic venous hypertension which may be mostly centrally mediated secondary to right heart heart failure. She has wounds on her right anterior tibia and right lateral ankle these are chronic we have been using endoform 6/22; TELEHEALTH; this patient was seen today via telehealth. 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 Since the last time we had contact with this patient she developed some pain and erythema around the wound on her right lateral malleolus/ankle and we put in antibiotics for her. This is resulted in good improvement with resolution of the erythema and tenderness. I changed her to silver alginate last time. We had been using endoform for an extended period of time 7/13; TELEHEALTH; this patient was seen today via telehealth. 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 She would continue to be at a prohibitive risk to be brought into our clinic unless this was absolutely necessary. These visits have been done with her approval as well as her husband. We have been using silver alginate to the areas on the right mid tibia and right lateral lower leg. 7/27 TELEHEALTH; patient was seen along with her husband today via telehealth. 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 We changed her to sample last visit. She has 2 areas a chronic wound on her right mid tibia and one just above her ankle. These were not the original wounds when she came into this clinic but she developed them during treatment 8/17; she comes in for her first face-to-face visit in a long period. She has a remaining area just medial to  the right tibia which is  the last open part of her large wound across this area. She also has an area on the right lateral lower leg. We prescribed Santyl last telehealth visit but they were concerned that this was making a deeper so they put silver alginate on it last week. Her husband changes the dressings. 8/31; using Santyl to the 2 wound areas some improvement in wound surfaces. Husband has surgery in 2 weeks we will put her out 3 weeks. Any of the advanced treatment options that I can think of that would be eligible for this wound would also cause her to have to come in weekly. The risk that the patient is just too high 9/21. Using Santyl to the 2 wound areas. Both of these are somewhat better although the medial mid tibia area still has exposed muscle. Lateral ankle requiring debridement. Using Santyl 10/12; using Santyl to the 2 wound areas. One on the right lateral ankle and the other in the medial calf which still has exposed muscle. Both areas have come down in size and have better looking surfaces. She has made nice progress with santyl 11/2; we have been using Santyl to the 2 wound areas. Right lateral ankle and the other in the mid tibia area the medial part of this still has exposed muscle. 11/23/2019 on evaluation today patient appears to be doing about the same really with regard to her wounds. She is actually not very pleased with how things seem to be progressing at this point she tells me that she really has not noted much improvement unfortunately. With that being said there is no signs of active infection at this time. There is some slough buildup noted at this time which again along with some dry skin around the edges of the wound I think would benefit her to try to debride some of this away. Fortunately her pain is doing fairly well. She still has exposed muscle in the right medial/tibial area. 12/14; TELEHEALTH; she was [facility] to [facility] to the right calf wound and right lateral ankle wound  when she was here last time. Unfortunately since then she had a fall with a pelvic fracture and a fracture of her wrist. She was apparently hospitalized for 5 days. I have not looked at her discharge summary. She apparently came out of the hospital with a blister on her right heel. She was seen today via telehealth by myself and our case manager. The patient and her husband were present. She has been using Hydrofera Blue at our direction from the last time she was in the clinic. There is been no major improvement in fact the areas appear deeper and with a less viable surface 01/04/2020; TELEHEALTH; the patient was seen today in accompaniment of her husband and our nurse. She has 3 open areas 1 on the right medial mid tibia, one on the right lateral ankle and a large eschared pressure area on the right heel. We have been using Iodoflex to the 2 original wounds. 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 It is simply too perilous for her to be seen in any other way 1/26; TELEHEALTH; the patient was seen today in accompaniment of her husband and our RN one of our nurses. She still has the 3 open areas 1 on the right medial mid tibia which is the remanent of a more extensive wound in this area, one on the right lateral ankle and a large eschared pressure area on  the right heel. We have been using Iodoflex to the 2 original wounds and a bed at 9 application to the eschared area on the heel 2/15; the first time we have seen this patient and then several months out of concern for the pandemic. She had a large horizontal wound in the mid tibia. Only the medial aspect of this is still open. Area on the lateral ankle is just about closed. She had a new pressure ulcer on the right heel which I have removed some of the eschar. We have been using Iodoflex which I will continue. The area in the mid tibia has a round circle in the middle of exposed muscle. I think we would have to use  an advanced treatment product to stimulate granulation over this area. We will run this through her insurance. She is not eligible for plastic surgery for many reasons 3/1; TELEHEALTH. The patient was seen today by telehealth. 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 We have been using Iodoflex to the wound areas which include the right anterior mid tibia, right lateral ankle and the right heel. All of these were reviewed 3/8; the patient's wound just above the right ankle is closed. She still has a contracting black eschar on the heel where she had a pressure ulcer. The medial part of her original wound on the mid tibia has exposed muscle. We have made really made no progress in this area although we have managed to get a lot of the original wound in this area to close. We used Apligraf #1 today 3/22; the patient's ankle wound remains closed. She still has a contracting black eschar on the heel although it seems to have less surface area. It still not clear whether there is any depth here. We used Apligraf #2 today in an attempt to get granulation over the exposed muscle and what is left of her mid tibia area. 4/5; everything is closed except the medial aspect of the mid tibia wound, as well as the pressure injury on the right heel. She has been using Betadine to the right heel which has been gradually contracting. We used Apligraf #3 today. 4/19; Apligraf #4. Still a pressure area on the right heel she has been using Betadine superficial excoriated areas she has been applying salicylic acid to on the anterior leg and the thick horn on the leg just above her wound area 5/3; Apligraf #5. Unfortunately she came in today with a reopening of the area on the right lateral lower leg. Not much change in the wound we have been treating in the mid calf. Quite a bit of edema surrounding this wound. She reports  that she was up on her feet quite a bit. The area on the right heel is separating she has been using Betadine She comes in with a new wound on the left lateral malleolus which is very disappointing this is been open since last week 5/17; we applied her last Apligraf last time. Unfortunately that did not have any effect on the deep area medially in the mid tibial area. However the area over the right lateral malleolus is a lot better. Right heel also is contracted we used Iodoflex last time. The new wound on the left lateral malleolus were using Santyl to. This required debridement. 6/1. Not much change in the area on the right medial mid tibia. Right lateral ankle is improved she has the necrotic  area on the right heel which was a pressure area. New area on the left lateral malleolus from last time. I changed her to Iodoflex to the area on the left lateral malleolus she said this hurt I have put her back on Santyl 6/15; right mid tibia is unchanged. I do not think there is anything I can do to this topically that we will get this to granulate over the muscle. And I am furthermore I am not sure that she is a surgical candidate either because of her severe pulmonary issues or because she really does not want to go through it. ooThe area on the right lateral malleolus is a small punched out area. ooThe area on the left lateral malleolus again small punched out with nonviable surface ooPressure ulcer on the right heel at separating black eschar. I went ahead and remove this today Lura Em has an area on the left anterior mid tibia just laterally. Geographic wounds debris on the surface. Some erythema here. 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 There is skin changes widely Change the primary dressing to Sorbact to all wound areas. She needs compression on the left leg as well as the right 6/21; right mid tibia which was her one remaining wound at 1 point is unchanged ooThe  area on the right lateral malleolus actually is close to closing over still a small punched out area ooThe area on the left lateral malleolus again a deep wound it is this 1 that she feels pain in. ooPressure ulcer on the right heel was cultured today. ooNew area on the left anterior mid tibia several geographic wounds last week in the setting of chronic venous insufficiency this actually looks some better 7/6; ooRight mid tibia about the same. Exposed muscle ooLeft lateral malleolus again necrotic debris over the surface. ooPressure ulcer on the right heel. She finished the Keflex we prescribed. Necrotic debris debrided with a #3 curette ooThe rest of her wounds on the right mid tibia right lateral ankle are better Her husband reminds me that she had a right femoral endarterectomy and has 2 stents in her left thigh placed in 2011 approximately by vascular surgery in [place]. She saw Dr. [name] last in February 2020. At that point he did not think that the arterial insufficiency she had on the right was sufficient to explain any of her right lower extremity wounds however she now has an area on the left lateral malleolus that looks like an ischemic wound 7/12; we have been using Sorbact all wound areas ooRight mid tibia about the same exposed muscle ooLeft lateral malleolus small wound with debris in the surface punched out ooPressure ulcer of the right heel requiring debridement of necrotic debris ooLateral ankle wound on the right is just about closed ooRight mid tibia wound about the same still with exposed muscle. 7/26 really no change in any wounds. We have been using Sorbact. She has bilateral lower extremity wounds. This is in the setting of scleroderma, severe venous hypertension. She also has known PAD and I had really hoped to be able to get a review by Dr. [name] but apparently that is not booked until August 31. I have asked her to call back to vein and vascular and get an  appointment with somebody a little earlier if that is possible. 8/16; patient's area on the right ankle which was a chronic wound is closed over again. The area on the right mid lower leg, right heel left lateral ankle are  all about the same. Pressure ulcer on the right heel is still not viable. New areas identified on the right lateral calf. She has a follow-up with Dr. [name] on 8/31. Phenomenally I would like him to go over her arterial status with regards to her underlying stents. 9/13; patient had her ARTERIAL studies done however apparently Dr. [name] is now in [place] so she did not get an appointment with him on 8/31 and was not referred to any other provider. On the right side her PTA was monophasic. Noncompressible. TBI on the right at 0.41 on the left she was monophasic and noncompressible they did not do a TBI because she had a Band-Aid on her big toe. Patient does not describe claudication although she is having a lot of pain in the left lateral malleolus. 10/11; the patient had a right-sided interventions on 10/5. She had a angioplasty of the right anterior tibial artery as well as the tibioperoneal trunk/peroneal artery she had a drug-coated balloon angioplasty of the right superficial femoral artery. On the left she did not have any interventions yet but is going to have another look at this in 2 weeks. The superficial femoral artery on the left and the associated stent are patent popliteal artery is patent to the dominant vessel runoff is the anterior tibial which has several high-grade stenosis in the proximal one third. We have been using Sorbact. She also has a new area on the right upper mid tibia. 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 She tolerated this well. She is using Iodoflex and her husband is doing a kerlix Coban on her. She arrives in clinic today with the original medial part of her mid tibia wound with muscle exposure small area on the right ankle which was part of her original wound she has 2 punched-out areas laterally and 1 above her area anteriorly. Nonviable surfaces similarly the right heel is nonviable. On the left side she has an area on the left foot and the left lateral ankle similar nonviable surfaces. 11/8; patient arrives in clinic today with nothing improved. She has a multiple lightly group of wounds on the right lower leg presumably different etiologies including a skin biopsy, the original wound she had medially and some punched-out areas that are not of clear defined etiology. She says she was in a lot of pain this week could not have her leg up on the bed did not get completely relief from dropping it over the bed. She also has a small area on the left lateral malleolus and the left lateral foot necrotic surfaces on all of these wounds. We have been using Iodoflex I attempted to put her in 3 layer compression last week that did not go well. We will be backing off on kerlix Coban this week. 11/15; the ultrasound that I sent the patient for last week showed findings consistent with acute deep vein thrombosis involving the right distal femoral vein and right popliteal vein. I was concerned about the combination of Plavix and Xarelto however apparently Dr. [name] felt that was not a problem. She is now going through the acute dosing of Xarelto. She has an appointment with Dr. Trula Slade on 12/5. 11/29; 10 days ago before he left for weeks vacation she called to  report pain in the right leg I sent her in some doxycycline her husband thought things were getting better however she required admission to hospital from 11/22 through 11/24 prompted by finding that her hemoglobin was 5.7 she was  transfused 2 units of packed cells and her hemoglobin stabilized. She did undergo an endoscopy with findings of significant esophagitis part of which was candidal and part of it reflux. She was discharged on nystatin swish and swallow which she is doing. She seems to be better from that regard. There was no active other active source of bleeding Unfortunately the combination of Xarelto and Plavix was felt to be too much for her. The Xarelto was stopped and an IVC filter was placed she is continued on Plavix. Her big complaint today is pain in the right calf really from the mid aspect superiorly. This is very tender slightly red but not particularly warm. She did see vein and vascular in the hospital I will see if I can pull their note 12/6; patient was at Dr. Stephens Shire office today. Great toe pressure on the right is 54 on the left 49 she has a 50 to 74% stenosis of the right mid popliteal on the left at 30 to 49% proximal SFA and a 50 to 74% CFA. She was treated with angioplasty of the right ATA, right TP trunk and peroneal artery as well as an angioplasty of the right SFA to popliteal artery. On 10/26 she underwent angioplasty of the left ATA and left TP trunk she has a history of a left common femoral endarterectomy and left superficial femoral artery stent as well as a right superficial femoral artery stent. In addition to her arterial disease she has significant venous disease with a history of a right greater saphenous vein ablation and a recent DVT in the right femoral and popliteal vein with insertion of an IVC filter. She has remained on Plavix but her anticoagulant was stopped. She continues to have a lot of swelling and pain in the right leg with significant erythema which I am assuming is stasis dermatitis. She has had substantial wound areas on the right leg including right lateral a large necrotic wound with a smaller biopsy site above this. She has original wound on the left mid tibia  with a small area above this as well. On the left she has a small area over the left lateral malleolus and the left lateral foot 12/22-week follow-up. Her right leg looks really terrible. She has another necrotic wound just above her original one on the right posterior leg. Greenish drainage suggestive of gram-negative/Pseudomonas infection. The entire mid part of her leg is erythematous I think most of this is stasis dermatitis rather than cellulitis. She has also a bothersome ischemic-looking right fourth toe which I noticed today without her really complaining. She has not been systemically unwell although she looks more physically frail 12/27; I reached out to vein and vascular last weekend Dr. [name] will be seeing her on Thursday. She has a gangrenous area on the tip of the right fourth toe. Multiple areas breaking down. I think this is a combination of arterial and severe venous insufficiency she has multiple wounds in the right leg but not a lot of pain. She has 3 small areas on the left leg we have been using Santyl. On the right leg we have been using silver alginate or at least that is what they have been using Culture I did last week showed abundant is Pseudomonas aeruginosa  had a few Enterococcus faecalis. I have been using Ceftin here predominantly because of the green drainage suggesting Pseudomonas I have not yet addressed the Enterococcus which was ampicillin sensitive 1/7; the patient was kindly seen by vein and vascular. She underwent a repeat ANGIOGRAM on 01/02/2021 by Dr. Donzetta Matters. She appears to have had bilateral common femoral treatments in the past. The right side is heavily heavily calcified and she has 1 area of 50% stenosis in the SFA that is nonflow limiting. She has dominant runoff via the anterior tibial and peroneal arteries. On the left side she also has a heavily calcified SFA no flow-limiting stenosis in the popliteal artery then she she then has runoff via the anterior  tibial and posterior tibial arteries without flow-limiting stenosis. She was not felt to require any intervention. She continues to have pain in the right leg with erythema although the erythema is somewhat better. Green drainage coming from the wounds on the posterior lateral right leg which are clearly necrotic. I have had her on 2-1/2 weeks of cefdinir this is while we awaited further arterial evaluation which is completed. I have now turned some thought to the status of her venous system particularly the DVT she had in November. This was an acute DVT involving the right distal femoral and right popliteal veins. She was then started on Xarelto in combination with Plavix but she required hospitalization in November with GI bleeding. She had an endoscopy by Dr. Lizbeth Bark of GI. This showed moderately severe esophagitis a combination of both food particles reflux and Candida. Localized mild inflammation characterized by erythema was found in the cardiac gastric fundus and the gastric body. The duodenal bulb's in the stages of the duodenum were normal. She then underwent a capsule endoscopy which showed evidence of oozing blood from the esophagus but no evidence of small bowel bleeding. She was not restarted on her Xarelto, she continues on Plavix and she has an IVC filter Objective Constitutional Patient is hypertensive.. Pulse regular and within target range for patient.Marland Kitchen Respiratory status seems stable oxygen on. Temperature is normal and within the target range for the patient.Marland Kitchen Appears in no distress. Vitals Time Taken: 11:32 AM, Height: 68 in, Source: Stated, Weight: 132 lbs, Source: Stated, BMI: 20.1, Temperature: 98.3 F, Pulse: 98 bpm, Respiratory Rate: 18 breaths/min, Blood Pressure: 166/76 mmHg. Cardiovascular Palpable but reduced dorsalis pedis pulses. General Notes: Wound exam; there are innumerable problems here. oo On the right side she has swelling and erythema in the right calf  area. There is some tenderness although this has improved slightly. Innumerable number of wounds but most remarkably on the right posterior lateral calf both of which are necrotic. She has other areas on the malleoli, multiple areas anteriorly. Although her recent arterial studies were not requiring any intervention she still has gangrenous tip of the fourth toe oo On the left she has small punched-out areas on the left lateral foot left lateral malleolus and left lateral leg. All of these have necrotic surfaces. I debrided the left lateral foot and left lateral malleolus. Integumentary (Hair, Skin) Wound #16 status is Open. Original cause of wound was Pressure Injury. The wound is located on the Right Calcaneus. The wound measures 0.5cm length x 1cm width x 0.3cm depth; 0.393cm^2 area and 0.118cm^3 volume. There is Fat Layer (Subcutaneous Tissue) exposed. There is no tunneling or undermining noted. There is a small amount of serous drainage noted. The wound margin is epibole. There is small (1-33%) pink, pale granulation  within the wound bed. There is a large (67-100%) amount of necrotic tissue within the wound bed including Adherent Slough. Wound #19 status is Open. Original cause of wound was Gradually Appeared. The wound is located on the Right,Lateral Malleolus. The wound measures 2.2cm length x 0.6cm width x 0.2cm depth; 1.037cm^2 area and 0.207cm^3 volume. There is Fat Layer (Subcutaneous Tissue) exposed. There is no tunneling or undermining noted. There is a small amount of serous drainage noted. The wound margin is distinct with the outline attached to the wound base. There is small (1-33%) pink granulation within the wound bed. There is a large (67-100%) amount of necrotic tissue within the wound bed including Adherent Slough. Wound #20 status is Open. Original cause of wound was Gradually Appeared. The wound is located on the Left,Lateral Malleolus. The wound measures 0.9cm length x 0.8cm  width x 0.2cm depth; 0.565cm^2 area and 0.113cm^3 volume. There is Fat Layer (Subcutaneous Tissue) exposed. There is no tunneling or undermining noted. There is a small amount of serous drainage noted. The wound margin is flat and intact. There is no granulation within the wound bed. There is a large (67-100%) amount of necrotic tissue within the wound bed including Adherent Slough. Wound #23 status is Open. Original cause of wound was Gradually Appeared. The wound is located on the Right,Posterior Lower Leg. The wound measures 9cm length x 5.1cm width x 0.6cm depth; 36.05cm^2 area and 21.63cm^3 volume. There is muscle and Fat Layer (Subcutaneous Tissue) exposed. There is no tunneling or undermining noted. There is a large amount of purulent drainage noted. Foul odor after cleansing was noted. The wound margin is distinct with the outline attached to the wound base. There is no granulation within the wound bed. There is a large (67-100%) amount of necrotic tissue within the wound bed including Eschar, Adherent Slough and Necrosis of Muscle. Wound #24 status is Open. Original cause of wound was Gradually Appeared. The wound is located on the Right,Anterior Lower Leg. The wound measures 1.5cm length x 1cm width x 0.2cm depth; 1.178cm^2 area and 0.236cm^3 volume. There is Fat Layer (Subcutaneous Tissue) exposed. There is no tunneling or undermining noted. There is a medium amount of serous drainage noted. The wound margin is flat and intact. There is no granulation within the wound bed. There is a large (67-100%) amount of necrotic tissue within the wound bed including Adherent Slough. Wound #25 status is Open. Original cause of wound was Gradually Appeared. The wound is located on the Left,Lateral Foot. The wound measures 1.1cm length x 0.7cm width x 0.1cm depth; 0.605cm^2 area and 0.06cm^3 volume. There is Fat Layer (Subcutaneous Tissue) exposed. There is no tunneling or undermining noted. There is a  small amount of serous drainage noted. The wound margin is flat and intact. There is no granulation within the wound bed. There is a large (67- 100%) amount of necrotic tissue within the wound bed including Adherent Slough. Wound #26 status is Open. Original cause of wound was Gradually Appeared. The wound is located on the Right,Lateral Lower Leg. The wound measures 1.3cm length x 1.4cm width x 0.2cm depth; 1.429cm^2 area and 0.286cm^3 volume. There is Fat Layer (Subcutaneous Tissue) exposed. There is no tunneling or undermining noted. There is a medium amount of purulent drainage noted. Foul odor after cleansing was noted. The wound margin is flat and intact. There is no granulation within the wound bed. There is a large (67-100%) amount of necrotic tissue within the wound bed including Adherent Slough. General  Notes: green tinged drainage Wound #27 status is Open. Original cause of wound was Gradually Appeared. The wound is located on the Right,Distal,Anterior Lower Leg. The wound measures 2cm length x 0.6cm width x 0.1cm depth; 0.942cm^2 area and 0.094cm^3 volume. There is Fat Layer (Subcutaneous Tissue) exposed. There is no tunneling or undermining noted. There is a medium amount of serous drainage noted. The wound margin is flat and intact. There is small (1-33%) pink granulation within the wound bed. There is a large (67-100%) amount of necrotic tissue within the wound bed including Adherent Slough. Wound #28 status is Open. Original cause of wound was Trauma. The wound is located on the Left,Lateral Lower Leg. The wound measures 0.6cm length x 0.4cm width x 0.1cm depth; 0.188cm^2 area and 0.019cm^3 volume. There is Fat Layer (Subcutaneous Tissue) exposed. There is no tunneling or undermining noted. There is a small amount of serous drainage noted. The wound margin is flat and intact. There is medium (34-66%) pink granulation within the wound bed. There is a medium (34-66%) amount of necrotic  tissue within the wound bed including Adherent Slough. Wound #29 status is Open. Original cause of wound was Gradually Appeared. The wound is located on the Right,Proximal,Lateral Lower Leg. The wound measures 1.5cm length x 1.1cm width x 0.1cm depth; 1.296cm^2 area and 0.13cm^3 volume. There is Fat Layer (Subcutaneous Tissue) exposed. There is no tunneling or undermining noted. There is a medium amount of purulent drainage noted. The wound margin is flat and intact. There is no granulation within the wound bed. There is a large (67-100%) amount of necrotic tissue within the wound bed including Adherent Slough. General Notes: green tinged drainage Wound #30 status is Open. Original cause of wound was Gradually Appeared. The wound is located on the Right T Fourth. The wound measures 1.5cm length oe x 2.8cm width x 0.1cm depth; 3.299cm^2 area and 0.33cm^3 volume. There is Fat Layer (Subcutaneous Tissue) exposed. There is no tunneling or undermining noted. There is a medium amount of purulent drainage noted. The wound margin is flat and intact. There is no granulation within the wound bed. There is a large (67-100%) amount of necrotic tissue within the wound bed including Eschar and Adherent Slough. Wound #31 status is Open. Original cause of wound was Gradually Appeared. The wound is located on the Left,Medial Ankle. The wound measures 0.8cm length x 1.2cm width x 0.1cm depth; 0.754cm^2 area and 0.075cm^3 volume. There is Fat Layer (Subcutaneous Tissue) exposed. There is no tunneling or undermining noted. There is a small amount of serosanguineous drainage noted. The wound margin is fibrotic, thickened scar. There is medium (34-66%) red granulation within the wound bed. There is a medium (34-66%) amount of necrotic tissue within the wound bed including Adherent Slough. Wound #32 status is Open. Original cause of wound was Gradually Appeared. The wound is located on the Right T Great. The wound measures  1.1cm length oe x 0.9cm width x 0.1cm depth; 0.778cm^2 area and 0.078cm^3 volume. There is Fat Layer (Subcutaneous Tissue) exposed. There is no tunneling or undermining noted. There is a small amount of serous drainage noted. The wound margin is flat and intact. There is small (1-33%) pink granulation within the wound bed. There is a large (67-100%) amount of necrotic tissue within the wound bed including Adherent Slough. Wound #5 status is Open. Original cause of wound was Gradually Appeared. The wound is located on the Right,Medial Lower Leg. The wound measures 2.5cm length x 2.5cm width x 0.2cm depth; 4.909cm^2  area and 0.982cm^3 volume. There is Fat Layer (Subcutaneous Tissue) exposed. There is no tunneling or undermining noted. There is a medium amount of serous drainage noted. The wound margin is flat and intact. There is no granulation within the wound bed. There is a large (67-100%) amount of necrotic tissue within the wound bed including Adherent Slough. 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 There was  a Excisional Skin/Subcutaneous Tissue Debridement with a total area of 0.72 sq cm performed by Ricard Dillon., MD. With the following instrument(s): Curette to remove Viable and Non-Viable tissue/material. Material removed includes Subcutaneous Tissue and Slough and after achieving pain control using Other (Benzocaine 20%). No specimens were taken. A time out was conducted at 12:30, prior to the start of the procedure. A Minimum amount of bleeding was controlled with Pressure. The procedure was tolerated well with a pain level of 4 throughout and a pain level of 2 following the procedure. Post Debridement Measurements: 0.9cm length x 0.8cm width x 0.2cm depth; 0.113cm^3 volume. Character of Wound/Ulcer Post Debridement requires further debridement. Severity of Tissue Post Debridement is: Fat layer exposed. 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 There was a Three Layer Compression Therapy Procedure by Levan Hurst, RN. 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 There was a Excisional Skin/Subcutaneous Tissue Debridement with a total area of 0.77 sq cm performed by Ricard Dillon., MD. With the following instrument(s): Curette to remove Viable and Non-Viable tissue/material. Material removed includes Subcutaneous Tissue and Slough and after achieving pain control using Other (Benzocaine 20%). No specimens were taken. A time out was conducted at 12:30, prior to the start of the procedure. A Minimum amount of bleeding was controlled with Pressure. The procedure was tolerated well with a pain level of 4 throughout and a pain level of 2 following the procedure. Post Debridement Measurements:  1.1cm length x 0.7cm width x 0.1cm depth; 0.06cm^3 volume. Character of Wound/Ulcer Post Debridement requires further debridement. Severity of Tissue Post Debridement is: Fat layer exposed. Post procedure Diagnosis Wound #25: Same as Pre-Procedure Pre-procedure diagnosis of Wound #25 is a Venous Leg Ulcer located on the Left,Lateral Foot . There was a Three Layer Compression Therapy Procedure by Levan Hurst, RN. Post procedure Diagnosis Wound #25: Same as Pre-Procedure Wound #16 Pre-procedure diagnosis of Wound #16 is a Pressure Ulcer located on the Right Calcaneus . There was a Three Layer Compression Therapy Procedure by Levan Hurst, RN. Post procedure Diagnosis Wound #  16: Same as Pre-Procedure Wound #19 Pre-procedure diagnosis of Wound #19 is a Venous Leg Ulcer located on the Right,Lateral Malleolus . There was a Three Layer Compression Therapy Procedure by Levan Hurst, RN. Post procedure Diagnosis Wound #19: Same as Pre-Procedure Wound #23 Pre-procedure diagnosis of Wound #23 is a Venous Leg Ulcer located on the Right,Posterior Lower Leg . There was a Three Layer Compression Therapy Procedure by Levan Hurst, RN. Post procedure Diagnosis Wound #23: Same as Pre-Procedure Wound #24 Pre-procedure diagnosis of Wound #24 is a Venous Leg Ulcer located on the Right,Anterior Lower Leg . There was a Three Layer Compression Therapy Procedure by Levan Hurst, RN. 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 There was a Three Layer Compression Therapy Procedure by Levan Hurst, RN. Post procedure Diagnosis Wound #26: Same as Pre-Procedure Wound #27 Pre-procedure diagnosis of Wound #27 is a Venous Leg Ulcer located on the Right,Distal,Anterior Lower Leg . There was a Three Layer Compression Therapy Procedure by Levan Hurst, RN. 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 There was a Three Layer Compression Therapy Procedure by Levan Hurst, RN. Post procedure Diagnosis Wound #28: Same as Pre-Procedure Wound #29 Pre-procedure diagnosis of Wound #29 is an Arterial Insufficiency Ulcer located on the Right,Proximal,Lateral Lower Leg . There was a Three Layer Compression Therapy Procedure by Levan Hurst, RN. Post procedure Diagnosis Wound #29: Same as Pre-Procedure Wound #31 Pre-procedure diagnosis of Wound #31 is a Venous Leg Ulcer located on the Left,Medial Ankle . There was a Three Layer Compression Therapy Procedure by Levan Hurst, RN. Post procedure Diagnosis Wound #31: Same as Pre-Procedure Wound #5 Pre-procedure diagnosis of Wound #5 is a Venous Leg Ulcer located on the Right,Medial Lower Leg . There was a Three Layer Compression Therapy Procedure by Levan Hurst, RN. Post procedure Diagnosis Wound #5: Same as Pre-Procedure Plan Follow-up Appointments: Return Appointment in 1 week. - *****EXTRA TIME - 90 MINUTES***** Bathing/ Shower/ Hygiene: May shower and wash wound with soap and water. - on days that dressing is changed Edema Control - Lymphedema / SCD / Other: Elevate legs to the level of the heart or above for 30 minutes daily and/or when sitting, a frequency of: - throughout the day Avoid standing for long periods of time. Exercise regularly WOUND #16: - Calcaneus Wound Laterality: Right Cleanser: Soap and Water 1 x Per Week/7 Days Discharge Instructions: May shower and wash wound with dial antibacterial soap and water prior to dressing change. Peri-Wound Care: Sween Lotion (Moisturizing lotion) 1 x Per Week/7 Days Discharge Instructions: Apply moisturizing lotion as directed Prim Dressing: Cutimed Sorbact Swab 1 x Per Week/7 Days ary Discharge Instructions: Apply to wound bed as  instructed Secondary Dressing: Woven Gauze Sponge, Non-Sterile 4x4 in 1 x Per Week/7 Days Discharge Instructions: Apply over primary dressing as directed. Secondary Dressing: ABD Pad, 8x10 1 x Per Week/7 Days Discharge Instructions: Apply over primary dressing as directed. Com pression Wrap: ThreePress (3 layer compression wrap) 1 x Per Week/7 Days Discharge Instructions: Apply three layer compression as directed. WOUND #19: - Malleolus Wound Laterality: Right, Lateral Cleanser: Soap and Water 1 x Per Week/7 Days Discharge Instructions: May shower and wash wound with dial antibacterial soap and water prior to dressing change. Peri-Wound Care: Sween Lotion (Moisturizing lotion) 1  x Per Week/7 Days Discharge Instructions: Apply moisturizing lotion as directed Prim Dressing: Cutimed Sorbact Swab 1 x Per Week/7 Days ary Discharge Instructions: Apply to wound bed as instructed Secondary Dressing: Woven Gauze Sponge, Non-Sterile 4x4 in 1 x Per Week/7 Days Discharge Instructions: Apply over primary dressing as directed. Secondary Dressing: ABD Pad, 8x10 1 x Per Week/7 Days Discharge Instructions: Apply over primary dressing as directed. Com pression Wrap: ThreePress (3 layer compression wrap) 1 x Per Week/7 Days Discharge Instructions: Apply three layer compression as directed. WOUND #20: - Malleolus Wound Laterality: Left, Lateral Cleanser: Soap and Water 1 x Per Week/7 Days Discharge Instructions: May shower and wash wound with dial antibacterial soap and water prior to dressing change. Peri-Wound Care: Sween Lotion (Moisturizing lotion) 1 x Per Week/7 Days Discharge Instructions: Apply moisturizing lotion as directed Prim Dressing: Cutimed Sorbact Swab 1 x Per Week/7 Days ary Discharge Instructions: Apply to wound bed as instructed Secondary Dressing: Woven Gauze Sponge, Non-Sterile 4x4 in 1 x Per Week/7 Days Discharge Instructions: Apply over primary dressing as directed. Secondary  Dressing: ABD Pad, 8x10 1 x Per Week/7 Days Discharge Instructions: Apply over primary dressing as directed. Com pression Wrap: ThreePress (3 layer compression wrap) 1 x Per Week/7 Days Discharge Instructions: Apply three layer compression as directed. WOUND #23: - Lower Leg Wound Laterality: Right, Posterior Cleanser: Soap and Water 1 x Per Week/7 Days Discharge Instructions: May shower and wash wound with dial antibacterial soap and water prior to dressing change. Peri-Wound Care: Sween Lotion (Moisturizing lotion) 1 x Per Week/7 Days Discharge Instructions: Apply moisturizing lotion as directed Prim Dressing: Cutimed Sorbact Swab 1 x Per Week/7 Days ary Discharge Instructions: Apply to wound bed as instructed Secondary Dressing: Woven Gauze Sponge, Non-Sterile 4x4 in 1 x Per Week/7 Days Discharge Instructions: Apply over primary dressing as directed. Secondary Dressing: ABD Pad, 8x10 1 x Per Week/7 Days Discharge Instructions: Apply over primary dressing as directed. Com pression Wrap: ThreePress (3 layer compression wrap) 1 x Per Week/7 Days Discharge Instructions: Apply three layer compression as directed. WOUND #24: - Lower Leg Wound Laterality: Right, Anterior Cleanser: Soap and Water 1 x Per Week/7 Days Discharge Instructions: May shower and wash wound with dial antibacterial soap and water prior to dressing change. Peri-Wound Care: Sween Lotion (Moisturizing lotion) 1 x Per Week/7 Days Discharge Instructions: Apply moisturizing lotion as directed Prim Dressing: Cutimed Sorbact Swab 1 x Per Week/7 Days ary Discharge Instructions: Apply to wound bed as instructed Secondary Dressing: Woven Gauze Sponge, Non-Sterile 4x4 in 1 x Per Week/7 Days Discharge Instructions: Apply over primary dressing as directed. Secondary Dressing: ABD Pad, 8x10 1 x Per Week/7 Days Discharge Instructions: Apply over primary dressing as directed. Com pression Wrap: ThreePress (3 layer compression wrap) 1  x Per Week/7 Days Discharge Instructions: Apply three layer compression as directed. WOUND #25: - Foot Wound Laterality: Left, Lateral Cleanser: Soap and Water 1 x Per Week/7 Days Discharge Instructions: May shower and wash wound with dial antibacterial soap and water prior to dressing change. Peri-Wound Care: Sween Lotion (Moisturizing lotion) 1 x Per Week/7 Days Discharge Instructions: Apply moisturizing lotion as directed Prim Dressing: Cutimed Sorbact Swab 1 x Per Week/7 Days ary Discharge Instructions: Apply to wound bed as instructed Secondary Dressing: Woven Gauze Sponge, Non-Sterile 4x4 in 1 x Per Week/7 Days Discharge Instructions: Apply over primary dressing as directed. Secondary Dressing: ABD Pad, 8x10 1 x Per Week/7 Days Discharge Instructions: Apply over primary dressing as directed. Com pression Wrap:  ThreePress (3 layer compression wrap) 1 x Per Week/7 Days Discharge Instructions: Apply three layer compression as directed. WOUND #26: - Lower Leg Wound Laterality: Right, Lateral Cleanser: Soap and Water 1 x Per Week/7 Days Discharge Instructions: May shower and wash wound with dial antibacterial soap and water prior to dressing change. Peri-Wound Care: Sween Lotion (Moisturizing lotion) 1 x Per Week/7 Days Discharge Instructions: Apply moisturizing lotion as directed Prim Dressing: Cutimed Sorbact Swab 1 x Per Week/7 Days ary Discharge Instructions: Apply to wound bed as instructed Secondary Dressing: Woven Gauze Sponge, Non-Sterile 4x4 in 1 x Per Week/7 Days Discharge Instructions: Apply over primary dressing as directed. Secondary Dressing: ABD Pad, 8x10 1 x Per Week/7 Days Discharge Instructions: Apply over primary dressing as directed. Com pression Wrap: ThreePress (3 layer compression wrap) 1 x Per Week/7 Days Discharge Instructions: Apply three layer compression as directed. WOUND #27: - Lower Leg Wound Laterality: Right, Anterior, Distal Cleanser: Soap and Water 1  x Per Week/7 Days Discharge Instructions: May shower and wash wound with dial antibacterial soap and water prior to dressing change. Peri-Wound Care: Sween Lotion (Moisturizing lotion) 1 x Per Week/7 Days Discharge Instructions: Apply moisturizing lotion as directed Prim Dressing: Cutimed Sorbact Swab 1 x Per Week/7 Days ary Discharge Instructions: Apply to wound bed as instructed Secondary Dressing: Woven Gauze Sponge, Non-Sterile 4x4 in 1 x Per Week/7 Days Discharge Instructions: Apply over primary dressing as directed. Secondary Dressing: ABD Pad, 8x10 1 x Per Week/7 Days Discharge Instructions: Apply over primary dressing as directed. Com pression Wrap: ThreePress (3 layer compression wrap) 1 x Per Week/7 Days Discharge Instructions: Apply three layer compression as directed. WOUND #28: - Lower Leg Wound Laterality: Left, Lateral Cleanser: Soap and Water 1 x Per Week/7 Days Discharge Instructions: May shower and wash wound with dial antibacterial soap and water prior to dressing change. Peri-Wound Care: Sween Lotion (Moisturizing lotion) 1 x Per Week/7 Days Discharge Instructions: Apply moisturizing lotion as directed Prim Dressing: Cutimed Sorbact Swab 1 x Per Week/7 Days ary Discharge Instructions: Apply to wound bed as instructed Secondary Dressing: Woven Gauze Sponge, Non-Sterile 4x4 in 1 x Per Week/7 Days Discharge Instructions: Apply over primary dressing as directed. Secondary Dressing: ABD Pad, 8x10 1 x Per Week/7 Days Discharge Instructions: Apply over primary dressing as directed. Com pression Wrap: ThreePress (3 layer compression wrap) 1 x Per Week/7 Days Discharge Instructions: Apply three layer compression as directed. WOUND #29: - Lower Leg Wound Laterality: Right, Lateral, Proximal Cleanser: Soap and Water 1 x Per Week/7 Days Discharge Instructions: May shower and wash wound with dial antibacterial soap and water prior to dressing change. Peri-Wound Care: Sween Lotion  (Moisturizing lotion) 1 x Per Week/7 Days Discharge Instructions: Apply moisturizing lotion as directed Prim Dressing: Cutimed Sorbact Swab 1 x Per Week/7 Days ary Discharge Instructions: Apply to wound bed as instructed Secondary Dressing: Woven Gauze Sponge, Non-Sterile 4x4 in 1 x Per Week/7 Days Discharge Instructions: Apply over primary dressing as directed. Secondary Dressing: ABD Pad, 8x10 1 x Per Week/7 Days Discharge Instructions: Apply over primary dressing as directed. Com pression Wrap: ThreePress (3 layer compression wrap) 1 x Per Week/7 Days Discharge Instructions: Apply three layer compression as directed. WOUND #30: - T Fourth Wound Laterality: Right oe Prim Dressing: KerraCel Ag Gelling Fiber Dressing, 4x5 in (silver alginate) Every Other Day/30 Days ary Discharge Instructions: Apply silver alginate to wound bed as instructed Secondary Dressing: Woven Gauze Sponges 2x2 in Every Other Day/30 Days Discharge Instructions: Apply over  primary dressing as directed. Secured With: Child psychotherapist, Sterile 2x75 (in/in) Every Other Day/30 Days Discharge Instructions: Secure with stretch gauze as directed. Secured With: Paper T ape, 1x10 (in/yd) Every Other Day/30 Days Discharge Instructions: Secure dressing with tape as directed. WOUND #31: - Ankle Wound Laterality: Left, Medial Cleanser: Soap and Water 1 x Per Week/7 Days Discharge Instructions: May shower and wash wound with dial antibacterial soap and water prior to dressing change. WOUND #32: - T Great Wound Laterality: Right oe Prim Dressing: KerraCel Ag Gelling Fiber Dressing, 4x5 in (silver alginate) Every Other Day/30 Days ary Discharge Instructions: Apply silver alginate to wound bed as instructed Secondary Dressing: Woven Gauze Sponges 2x2 in Every Other Day/30 Days Discharge Instructions: Apply over primary dressing as directed. Secured With: Child psychotherapist, Sterile 2x75 (in/in)  Every Other Day/30 Days Discharge Instructions: Secure with stretch gauze as directed. Secured With: Paper T ape, 1x10 (in/yd) Every Other Day/30 Days Discharge Instructions: Secure dressing with tape as directed. #1 continue with Sorbac to the majority of these wounds. Silver alginate to the right fourth toe 2. I increased her compression today armed with some good news with regards to her arterial status. I am now turning my attention to her venous system as a part of the cause of the multiple areas of breakdown in the right lower leg 3. I have had her on 2 weeks of cefdinir I am going to stop this she is going to need aggressive debridement of the wounds on the right lateral calf at which point I will try to do a deep tissue culture 4. 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 Could the continued edema and skin breakdown in the right lower leg be related to extension of her DVT rather than infection or continued arterial insufficiency 5. 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 I87.331 Chronic venous hypertension (idiopathic) with ulcer and inflammation of right lower extremity L89.616 Pressure-induced deep tissue damage of right heel L97.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 L97.528 Non-pressure chronic ulcer of other part of left foot with other specified severity I70.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 I70.248 Atherosclerosis of native arteries of left leg with ulceration of other part of lower leg Facility Procedures CPT4 Code: [id number] Description: 74944 - DEB SUBQ TISSUE 20 SQ CM/< ICD-10 Diagnosis Description L97.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 L97.528 Non-pressure chronic ulcer of other part of left foot with other specified severity Modifier: Quantity: 1 CPT4 Code: [id number] Description: (Facility Use Only)  [id number] - APPLY MULTLAY COMPRS LWR RT LEG Modifier: 52 Quantity: 1 Physician Procedures : CPT4 Code Description Modifier [id number] - WC PHYS SUBQ TISS 20 SQ CM ICD-10 Diagnosis Description L97.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 L97.528 Non-pressure chronic ulcer of other part of left foot with  other specified severity Quantity: 1 Electronic Signature(s) Signed: 01/12/2021 5:01:02 PM By: Linton Ham MD Previous Signature: 01/09/2021 5:04:19 PM Version By: Levan Hurst RN, BSN Entered By: Linton Ham on 01/10/2021 08:59:04

## 2021-01-12 NOTE — Progress Notes (Signed)
[name], [name] ([id number]) Visit Report for 01/12/2021 Arrival Information Details Patient Name: Date of Service: [name] 01/12/2021 11:30 A M Medical Record Number: [id number] Patient Account Number: [id number] Date of Birth/Sex: Treating RN: 07-05-1951 (70 y.o. [name] [name] Primary Care Rogers Ditter: [name] Other Clinician: Referring Damire Remedios: Treating Alaylah Heatherington/Extender: [name] in Treatment: 3 Visit Information History Since Last Visit Added or deleted any medications: No Patient Arrived: Wheel Chair Any new allergies or adverse reactions: No Arrival Time: 11:30 Had a fall or experienced change in No Accompanied By: husband activities of daily living that may affect Transfer Assistance: None risk of falls: Patient Identification Verified: Yes Signs or symptoms of abuse/neglect since last visito No Secondary Verification Process Completed: Yes Hospitalized since last visit: No Patient Requires Transmission-Based Precautions: No Implantable device outside of the clinic excluding No Patient Has Alerts: Yes cellular tissue based products placed in the center Patient Alerts: R ABI: [place] TBI: 0.41 since last visit: L ABI: [place] (07/2020) Has Dressing in Place as Prescribed: Yes Has Compression in Place as Prescribed: Yes Pain Present Now: Yes Electronic Signature(s) Signed: 01/12/2021 5:28:50 PM By: Levan Hurst RN, BSN Entered By: Levan Hurst on 01/12/2021 11:58:47 -------------------------------------------------------------------------------- Compression Therapy Details Patient Name: Date of Service: [name] 01/12/2021 11:30 A M Medical Record Number: [id number] Patient Account Number: [id number] Date of Birth/Sex: Treating RN: January 18, 1951 (70 y.o. [name] [name] Primary Care Kerrigan Glendening: [name] Other Clinician: Referring Marylynn Rigdon: Treating Phillippe Orlick/Extender: [name] in Treatment:  459 Compression Therapy Performed for Wound Assessment: Wound #16 Right Calcaneus Performed By: Clinician Levan Hurst, RN Compression Type: Three Layer Post Procedure Diagnosis Same as Pre-procedure Electronic Signature(s) Signed: 01/12/2021 5:28:50 PM By: Levan Hurst RN, BSN Entered By: Levan Hurst on 01/12/2021 12:15:00 -------------------------------------------------------------------------------- Compression Therapy Details Patient Name: Date of Service: [name] 01/12/2021 11:30 A M Medical Record Number: [id number] Patient Account Number: [id number] Date of Birth/Sex: Treating RN: [birth year] (70 y.o. [name] [name] Primary Care Tayler Lassen: [name] Other Clinician: Referring Jassmin Kemmerer: Treating Sherrian Nunnelley/Extender: [name] in Treatment: 459 Compression Therapy Performed for Wound Assessment: Wound #19 Right,Lateral Malleolus Performed By: Clinician Levan Hurst, RN Compression Type: Three Layer Post Procedure Diagnosis Same as Pre-procedure Electronic Signature(s) Signed: 01/12/2021 5:28:50 PM By: Levan Hurst RN, BSN Entered By: Levan Hurst on 01/12/2021 12:15:00 -------------------------------------------------------------------------------- Compression Therapy Details Patient Name: Date of Service: [name] 01/12/2021 11:30 A M Medical Record Number: [id number] Patient Account Number: [id number] Date of Birth/Sex: Treating RN: 10/11/1951 (70 y.o. [name] [name] Primary Care Jaymason Ledesma: [name] Other Clinician: Referring Perpetua Elling: Treating Dorla Guizar/Extender: [name] in Treatment: 459 Compression Therapy Performed for Wound Assessment: Wound #23 Right,Posterior Lower Leg Performed By: Clinician Levan Hurst, RN Compression Type: Three Layer Post Procedure Diagnosis Same as Pre-procedure Electronic Signature(s) Signed: 01/12/2021 5:28:50 PM By: Levan Hurst RN,  BSN Entered By: Levan Hurst on 01/12/2021 12:15:00 -------------------------------------------------------------------------------- Compression Therapy Details Patient Name: Date of Service: [name] 01/12/2021 11:30 A M Medical Record Number: [id number] Patient Account Number: [id number] Date of Birth/Sex: Treating RN: 1951-01-10 (70 y.o. [name] [name] Primary Care Shantavia Jha: [name] Other Clinician: Referring Doxie Augenstein: Treating Zyniah Ferraiolo/Extender: [name] in Treatment: 459 Compression Therapy Performed for Wound Assessment: Wound #24 Right,Anterior Lower Leg Performed By: Clinician Levan Hurst, RN Compression Type: Three  Layer Post Procedure Diagnosis Same as Pre-procedure Electronic Signature(s) Signed: 01/12/2021 5:28:50 PM By: Levan Hurst RN, BSN Entered By: Levan Hurst on 01/12/2021 12:15:00 -------------------------------------------------------------------------------- Compression Therapy Details Patient Name: Date of Service: [name] 01/12/2021 11:30 A M Medical Record Number: [id number] Patient Account Number: [id number] Date of Birth/Sex: Treating RN: 26-Nov-1951 (70 y.o. [name] [name] Primary Care Latorya Bautch: [name] Other Clinician: Referring Makaela Cando: Treating Charish Schroepfer/Extender: [name] in Treatment: 459 Compression Therapy Performed for Wound Assessment: Wound #25 Left,Lateral Foot Performed By: Clinician Levan Hurst, RN Compression Type: Three Layer Post Procedure Diagnosis Same as Pre-procedure Electronic Signature(s) Signed: 01/12/2021 5:28:50 PM By: Levan Hurst RN, BSN Entered By: Levan Hurst on 01/12/2021 12:15:00 -------------------------------------------------------------------------------- Compression Therapy Details Patient Name: Date of Service: [name] 01/12/2021 11:30 A M Medical Record Number: [id number] Patient Account  Number: [id number] Date of Birth/Sex: Treating RN: 1951-10-22 (70 y.o. [name] [name] Primary Care Guido Comp: [name] Other Clinician: Referring Geremy Rister: Treating Attilio Zeitler/Extender: [name] in Treatment: 459 Compression Therapy Performed for Wound Assessment: Wound #26 Right,Lateral Lower Leg Performed By: Clinician Levan Hurst, RN Compression Type: Three Layer Post Procedure Diagnosis Same as Pre-procedure Electronic Signature(s) Signed: 01/12/2021 5:28:50 PM By: Levan Hurst RN, BSN Entered By: Levan Hurst on 01/12/2021 12:15:00 -------------------------------------------------------------------------------- Compression Therapy Details Patient Name: Date of Service: [name] 01/12/2021 11:30 A M Medical Record Number: [id number] Patient Account Number: [id number] Date of Birth/Sex: Treating RN: May 24, 1951 (70 y.o. [name] [name] Primary Care Milka Windholz: Other Clinician: [name] Referring Gabriell Casimir: Treating Janis Cuffe/Extender: [name] in Treatment: 459 Compression Therapy Performed for Wound Assessment: Wound #27 Right,Distal,Anterior Lower Leg Performed By: Clinician Levan Hurst, RN Compression Type: Three Layer Post Procedure Diagnosis Same as Pre-procedure Electronic Signature(s) Signed: 01/12/2021 5:28:50 PM By: Levan Hurst RN, BSN Entered By: Levan Hurst on 01/12/2021 12:15:00 -------------------------------------------------------------------------------- Compression Therapy Details Patient Name: Date of Service: [name] 01/12/2021 11:30 A M Medical Record Number: [id number] Patient Account Number: [id number] Date of Birth/Sex: Treating RN: 07/21/1951 (70 y.o. [name] [name] Primary Care Richy Spradley: [name] Other Clinician: Referring Alvetta Hidrogo: Treating Omah Dewalt/Extender: [name] in Treatment: 459 Compression Therapy  Performed for Wound Assessment: Wound #29 Right,Proximal,Lateral Lower Leg Performed By: Clinician Levan Hurst, RN Compression Type: Three Layer Post Procedure Diagnosis Same as Pre-procedure Electronic Signature(s) Signed: 01/12/2021 5:28:50 PM By: Levan Hurst RN, BSN Entered By: Levan Hurst on 01/12/2021 12:15:00 -------------------------------------------------------------------------------- Compression Therapy Details Patient Name: Date of Service: [name] 01/12/2021 11:30 A M Medical Record Number: [id number] Patient Account Number: [id number] Date of Birth/Sex: Treating RN: Jun 15, 1951 (70 y.o. [name] [name] Primary Care Denzell Colasanti: [name] Other Clinician: Referring Lashon Beringer: Treating Venetta Knee/Extender: [name] in Treatment: 459 Compression Therapy Performed for Wound Assessment: Wound #5 Right,Medial Lower Leg Performed By: Clinician Levan Hurst, RN Compression Type: Three Layer Post Procedure Diagnosis Same as Pre-procedure Electronic Signature(s) Signed: 01/12/2021 5:28:50 PM By: Levan Hurst RN, BSN Entered By: Levan Hurst on 01/12/2021 12:15:00 -------------------------------------------------------------------------------- Encounter Discharge Information Details Patient Name: Date of Service: [name], [name] 01/12/2021 11:30 A M Medical Record Number: [id number] Patient Account Number: [id number] Date of Birth/Sex: Treating RN: 09-Jun-1951 (70 y.o. [name] [name] Primary Care Sheron Robin: [name] Other Clinician: Referring Rondy Krupinski: Treating Yianna Tersigni/Extender: [name] in Treatment: [phone]  Encounter Discharge Information Items Post Procedure Vitals Discharge Condition: Stable Temperature (F): 98.5 Ambulatory Status: Wheelchair Pulse (bpm): 75 Discharge Destination: Home Respiratory Rate (breaths/min): 18 Transportation: Private Auto Blood Pressure (mmHg):  152/71 Accompanied By: husband Schedule Follow-up Appointment: Yes Clinical Summary of Care: Patient Declined Electronic Signature(s) Signed: 01/12/2021 5:28:50 PM By: Levan Hurst RN, BSN Entered By: Levan Hurst on 01/12/2021 16:48:14 -------------------------------------------------------------------------------- Multi Wound Chart Details Patient Name: Date of Service: [name] 01/12/2021 11:30 A M Medical Record Number: [id number] Patient Account Number: [id number] Date of Birth/Sex: Treating RN: 02/21/1951 (70 y.o. [name], Meta.Reding Primary Care Nahuel Wilbert: [name] Other Clinician: Referring Tyler Cubit: Treating Zaharah Amir/Extender: [name] in Treatment: 10 Vital Signs Height(in): 67 Pulse(bpm): 41 Weight(lbs): 132 Blood Pressure(mmHg): 152/71 Body Mass Index(BMI): 20 Temperature(F): 98.5 Respiratory Rate(breaths/min): 18 Photos: [16:No Photos Right Calcaneus] [19:No Photos Right, Lateral Malleolus] [23:No Photos Right, Posterior Lower Leg] Wound Location: [16:Pressure Injury] [19:Gradually Appeared] [23:Gradually Appeared] Wounding Event: [16:Pressure Ulcer] [19:Venous Leg Ulcer] [23:Venous Leg Ulcer] Primary Etiology: [16:Anemia, Hypertension, Peripheral] [19:Anemia, Hypertension, Peripheral] [23:Anemia, Hypertension, Peripheral] Comorbid History: [16:Arterial Disease, Peripheral Venous Disease, Raynauds, Scleroderma, Rheumatoid Arthritis, Osteoarthritis 01/04/2020] [19:Arterial Disease, Peripheral Venous Disease, Raynauds, Scleroderma, Rheumatoid Arthritis, Osteoarthritis  05/02/2020] [23:Arterial Disease, Peripheral Venous Disease, Raynauds, Scleroderma, Rheumatoid Arthritis, Osteoarthritis 08/15/2020] Date Acquired: [16:53] [19:36] [23:21] Weeks of Treatment: [16:Open] [19:Open] [23:Open] Wound Status: [16:No] [19:No] [23:Yes] Clustered Wound: [16:0.5x1x0.3] [19:2.2x0.6x0.2] [23:9x5.1x0.6] Measurements L x W x D (cm) [16:0.393]  [19:1.037] [23:36.05] A (cm) : rea [16:0.118] [19:0.207] [23:21.63] Volume (cm) : [16:96.90%] [19:-635.50%] [23:-3110.20%] % Reduction in Area: [16:90.60%] [19:-639.30%] [23:-19212.50%] % Reduction in Volume: [16:Category/Stage III] [19:Full Thickness Without Exposed] [23:Full Thickness With Exposed Support] Classification: [16:Small] [19:Support Structures Small] [23:Structures Large] Exudate Amount: [16:Serous] [19:Purulent] [23:Purulent] Exudate Type: [16:amber] [19:yellow, brown, green] [23:yellow, brown, green] Exudate Color: [16:Epibole] [19:Distinct, outline attached] [23:Distinct, outline attached] Wound Margin: [16:Small (1-33%)] [19:Small (1-33%)] [23:None Present (0%)] Granulation Amount: [16:Pink, Pale] [19:Pink] [23:N/A] Granulation Quality: [16:Large (67-100%)] [19:Large (67-100%)] [23:Large (67-100%)] Necrotic Amount: [16:Adherent Slough] [19:Adherent Slough] [23:Eschar, Adherent Slough] Necrotic Tissue: [16:Fat Layer (Subcutaneous Tissue): Yes Fat Layer (Subcutaneous Tissue): Yes Fat Layer (Subcutaneous Tissue): Yes] Exposed Structures: [16:Fascia: No Tendon: No Muscle: No Joint: No Bone: No Small (1-33%)] [19:Fascia: No Tendon: No Muscle: No Joint: No Bone: No Small (1-33%)] [23:Muscle: Yes Fascia: No Tendon: No Joint: No Bone: No None] Epithelialization: [16:N/A] [19:N/A] [23:Debridement - Excisional] Debridement: Pre-procedure Verification/Time Out N/A [19:N/A] [23:12:04] Taken: [16:N/A] [19:N/A] [23:Lidocaine Injectable] Pain Control: [16:N/A] [19:N/A] [23:Subcutaneous, Slough] Tissue Debrided: [16:N/A] [19:N/A] [23:Skin/Subcutaneous Tissue] Level: [16:N/A] [19:N/A] [23:45.9] Debridement A (sq cm): [16:rea N/A] [19:N/A] [23:Blade, Curette, Forceps] Instrument: [16:N/A] [19:N/A] [23:Minimum] Bleeding: [16:N/A] [19:N/A] [23:Pressure] Hemostasis A chieved: [16:N/A] [19:N/A] [23:3] Procedural Pain: [16:N/A] [19:N/A] [23:0] Post Procedural Pain: [16:N/A] [19:N/A]  [23:Procedure was tolerated well] Debridement Treatment Response: [16:N/A] [19:N/A] [23:9x5.1x0.6] Post Debridement Measurements L x W x D (cm) [16:N/A] [19:N/A] [23:21.63] Post Debridement Volume: (cm) [16:Compression Therapy] [19:Compression Therapy] [23:Compression Therapy] Procedures Performed: [16:24] [19:25] [23:Debridement 26] Photos: [16:No Photos Right, Anterior Lower Leg] [19:No Photos Left, Lateral Foot] [23:No Photos Right, Lateral Lower Leg] Wound Location: [16:Gradually Appeared] [19:Gradually Appeared] [23:Gradually Appeared] Wounding Event: [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] [19:Venous Leg Ulcer] [23:Venous Leg Ulcer] Primary Etiology: [16:Anemia, Hypertension, Peripheral] [19:Anemia, Hypertension, Peripheral] [23:N/A] Comorbid History: [16:Arterial Disease, Peripheral Venous Arterial Disease, Peripheral Venous Disease, Raynauds, Scleroderma, Disease, Raynauds, Scleroderma, Rheumatoid Arthritis, Osteoarthritis Rheumatoid Arthritis, Osteoarthritis 10/10/2020]  [19:10/10/2020] [23:10/27/2020] Date A cquired: [16:13] [19:13] [23:10] Weeks of Treatment: [16:Open] [19:Open] [23:Open] Wound  Status: [16:No] [19:No] [23:No] Clustered Wound: [16:1.5x1x0.2] [19:1.1x0.7x0.1] [23:1.3x1.4x0.2] Measurements L x W x D (cm) [16:1.178] [19:0.605] [23:1.429] A (cm) : rea [16:0.236] [19:0.06] [23:0.286] Volume (cm) : [16:2.60%] [19:-60.50%] [23:-188.70%] % Reduction in A [16:rea: 35.00%] [19:-57.90%] [23:-93.20%] % Reduction in Volume: [16:Full Thickness Without Exposed] [19:Full Thickness Without Exposed] [23:Full Thickness Without Exposed] Classification: [16:Support Structures Medium] [19:Support Structures Small] [23:Support Structures N/A] Exudate A mount: [16:Purulent] [19:Serous] [23:N/A] Exudate Type: [16:yellow, brown, green] [19:amber] [23:N/A] Exudate Color: [16:Flat and Intact] [19:Flat and Intact] [23:N/A] Wound Margin: [16:None Present (0%)] [19:None Present (0%)] [23:N/A] Granulation A  mount: [16:N/A] [19:N/A] [23:N/A] Granulation Quality: [16:Large (67-100%)] [19:Large (67-100%)] [23:N/A] Necrotic A mount: [16:Adherent Slough] [19:Adherent Slough] [23:N/A] Necrotic Tissue: [16:Fat Layer (Subcutaneous Tissue): Yes Fat Layer (Subcutaneous Tissue): Yes N/A] Exposed Structures: [16:Fascia: No Tendon: No Muscle: No Joint: No Bone: No Small (1-33%)] [19:Fascia: No Tendon: No Muscle: No Joint: No Bone: No Small (1-33%)] [23:N/A] Epithelialization: [16:N/A] [19:N/A] [23:N/A] Debridement: [16:N/A] [19:N/A] [23:N/A] Pain Control: [16:N/A] [19:N/A] [23:N/A] Tissue Debrided: [16:N/A] [19:N/A] [23:N/A] Level: [16:N/A] [19:N/A] [23:N/A] Debridement A (sq cm): [16:rea N/A] [19:N/A] [23:N/A] Instrument: [16:N/A] [19:N/A] [23:N/A] Bleeding: [16:N/A] [19:N/A] [23:N/A] Hemostasis A chieved: [16:N/A] [19:N/A] [23:N/A] Procedural Pain: [16:N/A] [19:N/A] [23:N/A] Post Procedural Pain: Debridement Treatment Response: N/A [19:N/A] [23:N/A] Post Debridement Measurements L x N/A [19:N/A] [23:N/A] W x D (cm) [16:N/A] [19:N/A] [23:N/A] Post Debridement Volume: (cm) [16:Compression Therapy] [19:Compression Therapy] [23:Compression [place] Wound Number: [id number] Photos: No Photos No Photos No Photos Right, Distal, Anterior Lower Leg Right, Proximal, Lateral Lower Leg Right T [place] Wound Location: Gradually Appeared Gradually Appeared Gradually Appeared 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 Etiology: N/A N/A N/A Comorbid History: 11/07/2020 12/14/2020 01/05/2021 Date Acquired: 9 3 0 Weeks of Treatment: Open Open Open Wound Status: No No No Clustered Wound: 2x0.6x0.1 1.5x1.1x0.1 1.1x0.9x0.1 Measurements L x W x D (cm) 0.942 1.296 0.778 A (cm) : rea 0.094 0.13 0.078 Volume (cm) : -232.90% -10.00% 0.00% % Reduction in A rea: -235.70% 81.60% 0.00% % Reduction in Volume: Full Thickness Without Exposed Full Thickness Without Exposed  Full Thickness Without Exposed Classification: Support Structures Support Structures Support Structures N/A N/A N/A Exudate A mount: N/A N/A N/A Exudate Type: N/A N/A N/A Exudate Color: N/A N/A N/A Wound Margin: N/A N/A N/A Granulation A mount: N/A N/A N/A Granulation Quality: N/A N/A N/A Necrotic A mount: N/A N/A N/A Necrotic Tissue: N/A N/A N/A Exposed Structures: N/A N/A N/A Epithelialization: N/A N/A N/A Debridement: N/A N/A N/A Pain Control: N/A N/A N/A Tissue Debrided: N/A N/A N/A Level: N/A N/A N/A Debridement A (sq cm): rea N/A N/A N/A Instrument: N/A N/A N/A Bleeding: N/A N/A N/A Hemostasis A chieved: N/A N/A N/A Procedural Pain: N/A N/A N/A Post Procedural Pain: Debridement Treatment Response: N/A N/A N/A Post Debridement Measurements L x N/A N/A N/A W x D (cm) N/A N/A N/A Post Debridement Volume: (cm) Compression Therapy Compression Therapy N/A Procedures Performed: Wound Number: 5 N/A N/A Photos: No Photos N/A N/A Right, Medial Lower Leg N/A N/A Wound Location: Gradually Appeared N/A N/A Wounding Event: Venous Leg Ulcer N/A N/A Primary Etiology: N/A N/A N/A Comorbid History: 01/13/2013 N/A N/A Date Acquired: 416 N/A N/A Weeks of Treatment: Open N/A N/A Wound Status: Yes N/A N/A Clustered Wound: 2.5x2.5x0.2 N/A N/A Measurements L x W x D (cm) 4.909 N/A N/A A (cm) : rea 0.982 N/A N/A Volume (cm) : -247.20% N/A N/A % Reduction in A rea: -596.50% N/A N/A % Reduction in  Volume: Full Thickness With Exposed Support N/A N/A Classification: Structures N/A N/A N/A Exudate A mount: N/A N/A N/A Exudate Type: N/A N/A N/A Exudate Color: N/A N/A N/A Wound Margin: N/A N/A N/A Granulation A mount: N/A N/A N/A Granulation Quality: N/A N/A N/A Necrotic A mount: N/A N/A N/A Necrotic Tissue: N/A N/A N/A Exposed Structures: N/A N/A N/A Epithelialization: N/A N/A N/A Debridement: N/A N/A N/A Pain Control: N/A N/A  N/A Tissue Debrided: N/A N/A N/A Level: N/A N/A N/A Debridement A (sq cm): rea N/A N/A N/A Instrument: N/A N/A N/A Bleeding: N/A N/A N/A Hemostasis A chieved: N/A N/A N/A Procedural Pain: N/A N/A N/A Post Procedural Pain: Debridement Treatment Response: N/A N/A N/A Post Debridement Measurements L x N/A N/A N/A W x D (cm) N/A N/A N/A Post Debridement Volume: (cm) Compression Therapy N/A N/A Procedures Performed: Treatment Notes Electronic Signature(s) Signed: 01/12/2021 5:01:02 PM By: Linton Ham MD Signed: 01/12/2021 5:39:22 PM By: Deon Pilling Entered By: Linton Ham on 01/12/2021 12:23:39 -------------------------------------------------------------------------------- Multi-Disciplinary Care Plan Details Patient Name: Date of Service: [name], [name] 01/12/2021 11:30 A M Medical Record Number: [id number] Patient Account Number: [id number] Date of Birth/Sex: Treating RN: 1951/03/22 (70 y.o. [name] [name] Primary Care Shakelia Scrivner: [name] Other Clinician: Referring Casten Floren: Treating Anniebelle Devore/Extender: [name] in Treatment: 459 Active Inactive Wound/Skin Impairment Nursing Diagnoses: Impaired tissue integrity Knowledge deficit related to ulceration/compromised skin integrity Goals: Patient/caregiver will verbalize understanding of skin care regimen Date Initiated: 10/11/2016 Target Resolution Date: 02/10/2021 Goal Status: Active Interventions: Assess patient/caregiver ability to obtain necessary supplies Assess patient/caregiver ability to perform ulcer/skin care regimen upon admission and as needed Assess ulceration(s) every visit Provide education on ulcer and skin care Treatment Activities: Skin care regimen initiated : 10/11/2016 Topical wound management initiated : 10/11/2016 Notes: Electronic Signature(s) Signed: 01/12/2021 5:28:50 PM By: Levan Hurst RN, BSN Entered By: Levan Hurst on 01/12/2021  16:44:41 -------------------------------------------------------------------------------- Pain Assessment Details Patient Name: Date of Service: [name] 01/12/2021 11:30 A M Medical Record Number: [id number] Patient Account Number: [id number] Date of Birth/Sex: Treating RN: 07-Sep-1951 (70 y.o. [name] [name] Primary Care Mikaia Janvier: [name] Other Clinician: Referring Gwendoline Judy: Treating Roshard Rezabek/Extender: [name] in Treatment: 459 Active Problems Location of Pain Severity and Description of Pain Patient Has Paino Yes Patient Has Paino Yes Site Locations Pain Location: Pain in Ulcers With Dressing Change: Yes Duration of the Pain. Constant / Intermittento Constant Rate the pain. Current Pain Level: 8 Character of Pain Describe the Pain: Throbbing Pain Management and Medication Current Pain Management: Medication: Yes Cold Application: No Rest: No Massage: No Activity: No T.E.N.S.: No Heat Application: No Leg drop or elevation: No Is the Current Pain Management Adequate: Inadequate How does your wound impact your activities of daily livingo Sleep: No Bathing: No Appetite: No Relationship With Others: No Bladder Continence: No Emotions: No Bowel Continence: No Work: No Toileting: No Drive: No Dressing: No Hobbies: No Engineer, maintenance) Signed: 01/12/2021 5:28:50 PM By: Levan Hurst RN, BSN Entered By: Levan Hurst on 01/12/2021 12:04:39 -------------------------------------------------------------------------------- Patient/Caregiver Education Details Patient Name: Date of Service: BLA [name] 1/13/2022andnbsp11:30 A M Medical Record Number: [id number] Patient Account Number: [id number] Date of Birth/Gender: Treating RN: 06/08/1951 (70 y.o. [name] [name] Primary Care Physician: [name] Other Clinician: Referring Physician: Treating Physician/Extender: [name] in Treatment: 459 Education Assessment Education Provided To: Patient Education Topics Provided Wound/Skin Impairment:  Methods: Explain/Verbal Responses: State content correctly Electronic Signature(s) Signed: 01/12/2021 5:28:50 PM By: Levan Hurst RN, BSN Entered By: Levan Hurst on 01/12/2021 16:45:03 -------------------------------------------------------------------------------- Wound Assessment Details Patient Name: Date of Service: [name] 01/12/2021 11:30 A M Medical Record Number: [id number] Patient Account Number: [id number] Date of Birth/Sex: Treating RN: Oct 04, 1951 (70 y.o. [name] [name] Primary Care Alondra Sahni: [name] Other Clinician: Referring Nuala Chiles: Treating Adel Neyer/Extender: [name] in Treatment: [place] Wound Status Wound Number: 16 Primary Pressure Ulcer Etiology: Wound Location: Right Calcaneus Wound Open Wounding Event: Pressure Injury Status: Date Acquired: 01/04/2020 Comorbid Anemia, Hypertension, Peripheral Arterial Disease, Peripheral Weeks Of Treatment: 53 History: Venous Disease, Raynauds, Scleroderma, Rheumatoid Arthritis, Clustered Wound: No Osteoarthritis Wound Measurements Length: (cm) 0.5 Width: (cm) 1 Depth: (cm) 0.3 Area: (cm) 0.393 Volume: (cm) 0.118 % Reduction in Area: 96.9% % Reduction in Volume: 90.6% Epithelialization: Small (1-33%) Tunneling: No Undermining: No Wound Description Classification: Category/Stage III Wound Margin: Epibole Exudate Amount: Small Exudate Type: Serous Exudate Color: amber Foul Odor After Cleansing: No Slough/Fibrino Yes Wound Bed Granulation Amount: Small (1-33%) Exposed Structure Granulation Quality: Pink, Pale Fascia Exposed: No Necrotic Amount: Large (67-100%) Fat Layer (Subcutaneous Tissue) Exposed: Yes Necrotic Quality: Adherent Slough Tendon Exposed: No Muscle Exposed: No Joint Exposed: No Bone Exposed: No Treatment  Notes Wound #16 (Calcaneus) Wound Laterality: Right Cleanser Soap and Water Discharge Instruction: May shower and wash wound with dial antibacterial soap and water prior to dressing change. Peri-Wound Care Sween Lotion (Moisturizing lotion) Discharge Instruction: Apply moisturizing lotion as directed Topical Primary Dressing KerraCel Ag Gelling Fiber Dressing, 4x5 in (silver alginate) Discharge Instruction: Apply silver alginate to wound bed as instructed Secondary Dressing Woven Gauze Sponge, Non-Sterile 4x4 in Discharge Instruction: Apply over primary dressing as directed. ABD Pad, 8x10 Discharge Instruction: Apply over primary dressing as directed. Secured With Compression Wrap ThreePress (3 layer compression wrap) Discharge Instruction: Apply three layer compression as directed. Compression Stockings Add-Ons Electronic Signature(s) Signed: 01/12/2021 5:28:50 PM By: Levan Hurst RN, BSN Entered By: Levan Hurst on 01/12/2021 12:11:54 -------------------------------------------------------------------------------- Wound Assessment Details Patient Name: Date of Service: [name] 01/12/2021 11:30 A M Medical Record Number: [id number] Patient Account Number: [id number] Date of Birth/Sex: Treating RN: 04-Jun-1951 (70 y.o. [name] [name] Primary Care Macel Yearsley: [name] Other Clinician: Referring Samika Vetsch: Treating Koty Anctil/Extender: [name] in Treatment: [place] Wound Status Wound Number: 19 Primary Venous Leg Ulcer Etiology: Wound Location: Right, Lateral Malleolus Wound Open Wounding Event: Gradually Appeared Status: Date Acquired: 05/02/2020 Comorbid Anemia, Hypertension, Peripheral Arterial Disease, Peripheral Weeks Of Treatment: 36 History: Venous Disease, Raynauds, Scleroderma, Rheumatoid Arthritis, Clustered Wound: No Osteoarthritis Wound Measurements Length: (cm) 2.2 Width: (cm) 0.6 Depth: (cm) 0.2 Area: (cm)  1.037 Volume: (cm) 0.207 % Reduction in Area: -635.5% % Reduction in Volume: -639.3% Epithelialization: Small (1-33%) Tunneling: No Undermining: No Wound Description Classification: Full Thickness Without Exposed Support Structures Wound Margin: Distinct, outline attached Exudate Amount: Small Exudate Type: Purulent Exudate Color: yellow, brown, green Foul Odor After Cleansing: No Slough/Fibrino Yes Wound Bed Granulation Amount: Small (1-33%) Exposed Structure Granulation Quality: Pink Fascia Exposed: No Necrotic Amount: Large (67-100%) Fat Layer (Subcutaneous Tissue) Exposed: Yes Necrotic Quality: Adherent Slough Tendon Exposed: No Muscle Exposed: No Joint Exposed: No Bone Exposed: No Treatment Notes Wound #19 (Malleolus) Wound Laterality: Right, Lateral Cleanser Soap and Water Discharge Instruction: May shower and wash wound with dial antibacterial soap and water prior to dressing change. Peri-Wound Care  Sween Lotion (Moisturizing lotion) Discharge Instruction: Apply moisturizing lotion as directed Topical Primary Dressing KerraCel Ag Gelling Fiber Dressing, 4x5 in (silver alginate) Discharge Instruction: Apply silver alginate to wound bed as instructed Secondary Dressing Woven Gauze Sponge, Non-Sterile 4x4 in Discharge Instruction: Apply over primary dressing as directed. ABD Pad, 8x10 Discharge Instruction: Apply over primary dressing as directed. Secured With Compression Wrap ThreePress (3 layer compression wrap) Discharge Instruction: Apply three layer compression as directed. Compression Stockings Add-Ons Electronic Signature(s) Signed: 01/12/2021 5:28:50 PM By: Levan Hurst RN, BSN Entered By: Levan Hurst on 01/12/2021 12:13:55 -------------------------------------------------------------------------------- Wound Assessment Details Patient Name: Date of Service: [name] 01/12/2021 11:30 A M Medical Record Number: [id number] Patient  Account Number: [id number] Date of Birth/Sex: Treating RN: [birth year] (70 y.o. [name] [name] Primary Care Chey Cho: [name] Other Clinician: Referring Virginia Curl: Treating Daron Stutz/Extender: [name] in Treatment: [place] Wound Status Wound Number: 23 Primary Venous Leg Ulcer Etiology: Wound Location: Right, Posterior Lower Leg Wound Open Wounding Event: Gradually Appeared Status: Date Acquired: 08/15/2020 Comorbid Anemia, Hypertension, Peripheral Arterial Disease, Peripheral Weeks Of Treatment: 21 History: Venous Disease, Raynauds, Scleroderma, Rheumatoid Arthritis, Clustered Wound: Yes Osteoarthritis Wound Measurements Length: (cm) 9 Width: (cm) 5.1 Depth: (cm) 0.6 Area: (cm) 36.05 Volume: (cm) 21.63 % Reduction in Area: -3110.2% % Reduction in Volume: -19212.5% Epithelialization: None Tunneling: No Undermining: No Wound Description Classification: Full Thickness With Exposed Support Structures Wound Margin: Distinct, outline attached Exudate Amount: Large Exudate Type: Purulent Exudate Color: yellow, brown, green Foul Odor After Cleansing: No Slough/Fibrino Yes Wound Bed Granulation Amount: None Present (0%) Exposed Structure Necrotic Amount: Large (67-100%) Fascia Exposed: No Necrotic Quality: Eschar, Adherent Slough Fat Layer (Subcutaneous Tissue) Exposed: Yes Tendon Exposed: No Muscle Exposed: Yes Necrosis of Muscle: Yes Joint Exposed: No Bone Exposed: No Treatment Notes Wound #23 (Lower Leg) Wound Laterality: Right, Posterior Cleanser Soap and Water Discharge Instruction: May shower and wash wound with dial antibacterial soap and water prior to dressing change. Peri-Wound Care Sween Lotion (Moisturizing lotion) Discharge Instruction: Apply moisturizing lotion as directed Topical Primary Dressing KerraCel Ag Gelling Fiber Dressing, 4x5 in (silver alginate) Discharge Instruction: Apply silver alginate to wound bed as  instructed Secondary Dressing Woven Gauze Sponge, Non-Sterile 4x4 in Discharge Instruction: Apply over primary dressing as directed. ABD Pad, 8x10 Discharge Instruction: Apply over primary dressing as directed. Secured With Compression Wrap ThreePress (3 layer compression wrap) Discharge Instruction: Apply three layer compression as directed. Compression Stockings Add-Ons Electronic Signature(s) Signed: 01/12/2021 5:28:50 PM By: Levan Hurst RN, BSN Entered By: Levan Hurst on 01/12/2021 12:15:04 -------------------------------------------------------------------------------- Wound Assessment Details Patient Name: Date of Service: [name] 01/12/2021 11:30 A M Medical Record Number: [id number] Patient Account Number: [id number] Date of Birth/Sex: Treating RN: [birth year] (70 y.o. [name] [name] Primary Care Malique Driskill: [name] Other Clinician: Referring Jabril Pursell: Treating Joesph Marcy/Extender: [name] in Treatment: 459 Wound Status Wound Number: 24 Primary Venous Leg Ulcer Etiology: Wound Location: Right, Anterior Lower Leg Wound Open Wounding Event: Gradually Appeared Status: Date Acquired: 10/10/2020 Comorbid Anemia, Hypertension, Peripheral Arterial Disease, Peripheral Weeks Of Treatment: 13 History: Venous Disease, Raynauds, Scleroderma, Rheumatoid Arthritis, Clustered Wound: No Osteoarthritis Wound Measurements Length: (cm) 1.5 Width: (cm) 1 Depth: (cm) 0.2 Area: (cm) 1.178 Volume: (cm) 0.236 % Reduction in Area: 2.6% % Reduction in Volume: 35% Epithelialization: Small (1-33%) Tunneling: No Undermining: No Wound Description Classification: Full Thickness Without Exposed Support Structu Wound Margin: Flat and Intact Exudate  Amount: Medium Exudate Type: Purulent Exudate Color: yellow, brown, green res Foul Odor After Cleansing: No Slough/Fibrino Yes Wound Bed Granulation Amount: None Present (0%) Exposed  Structure Necrotic Amount: Large (67-100%) Fascia Exposed: No Necrotic Quality: Adherent Slough Fat Layer (Subcutaneous Tissue) Exposed: Yes Tendon Exposed: No Muscle Exposed: No Joint Exposed: No Bone Exposed: No Treatment Notes Wound #24 (Lower Leg) Wound Laterality: Right, Anterior Cleanser Soap and Water Discharge Instruction: May shower and wash wound with dial antibacterial soap and water prior to dressing change. Peri-Wound Care Sween Lotion (Moisturizing lotion) Discharge Instruction: Apply moisturizing lotion as directed Topical Primary Dressing KerraCel Ag Gelling Fiber Dressing, 4x5 in (silver alginate) Discharge Instruction: Apply silver alginate to wound bed as instructed Secondary Dressing Woven Gauze Sponge, Non-Sterile 4x4 in Discharge Instruction: Apply over primary dressing as directed. ABD Pad, 8x10 Discharge Instruction: Apply over primary dressing as directed. Secured With Compression Wrap ThreePress (3 layer compression wrap) Discharge Instruction: Apply three layer compression as directed. Compression Stockings Add-Ons Electronic Signature(s) Signed: 01/12/2021 5:28:50 PM By: Levan Hurst RN, BSN Entered By: Levan Hurst on 01/12/2021 12:15:49 -------------------------------------------------------------------------------- Wound Assessment Details Patient Name: Date of Service: [name] 01/12/2021 11:30 A M Medical Record Number: [id number] Patient Account Number: [id number] Date of Birth/Sex: Treating RN: [birth year] (70 y.o. [name] [name] Primary Care Ovidio Steele: [name] Other Clinician: Referring Melessia Kaus: Treating Vlasta Baskin/Extender: [name] in Treatment: [place] Wound Status Wound Number: 25 Primary Venous Leg Ulcer Etiology: Wound Location: Left, Lateral Foot Wound Open Wounding Event: Gradually Appeared Status: Date Acquired: 10/10/2020 Date Acquired: 10/10/2020 Comorbid Anemia,  Hypertension, Peripheral Arterial Disease, Peripheral Weeks Of Treatment: 13 History: Venous Disease, Raynauds, Scleroderma, Rheumatoid Arthritis, Clustered Wound: No Osteoarthritis Wound Measurements Length: (cm) 1.1 Width: (cm) 0.7 Depth: (cm) 0.1 Area: (cm) 0.605 Volume: (cm) 0.06 % Reduction in Area: -60.5% % Reduction in Volume: -57.9% Epithelialization: Small (1-33%) Tunneling: No Undermining: No Wound Description Classification: Full Thickness Without Exposed Support Structures Wound Margin: Flat and Intact Exudate Amount: Small Exudate Type: Serous Exudate Color: amber Foul Odor After Cleansing: No Slough/Fibrino Yes Wound Bed Granulation Amount: None Present (0%) Exposed Structure Necrotic Amount: Large (67-100%) Fascia Exposed: No Necrotic Quality: Adherent Slough Fat Layer (Subcutaneous Tissue) Exposed: Yes Tendon Exposed: No Muscle Exposed: No Joint Exposed: No Bone Exposed: No Treatment Notes Wound #25 (Foot) Wound Laterality: Left, Lateral Cleanser Soap and Water Discharge Instruction: May shower and wash wound with dial antibacterial soap and water prior to dressing change. Peri-Wound Care Sween Lotion (Moisturizing lotion) Discharge Instruction: Apply moisturizing lotion as directed Topical Primary Dressing Cutimed Sorbact Swab Discharge Instruction: Apply to wound bed as instructed Secondary Dressing Woven Gauze Sponge, Non-Sterile 4x4 in Discharge Instruction: Apply over primary dressing as directed. ABD Pad, 8x10 Discharge Instruction: Apply over primary dressing as directed. Secured With Compression Wrap ThreePress (3 layer compression wrap) Discharge Instruction: Apply three layer compression as directed. Compression Stockings Add-Ons Electronic Signature(s) Signed: 01/12/2021 5:28:50 PM By: Levan Hurst RN, BSN Entered By: Levan Hurst on 01/12/2021  12:17:54 -------------------------------------------------------------------------------- Wound Assessment Details Patient Name: Date of Service: [name] 01/12/2021 11:30 A M Medical Record Number: [id number] Patient Account Number: [id number] Date of Birth/Sex: Treating RN: 08-21-1951 (70 y.o. [name] [name] Primary Care Darchelle Nunes: [name] Other Clinician: Referring Simora Dingee: Treating Rogan Wigley/Extender: [name] in Treatment: 459 Wound Status Wound Number: 26 Primary Venous Leg Ulcer Etiology: Wound Location: Right, Lateral Lower Leg Wound Open Wounding Event:  Gradually Appeared Status: Date Acquired: 10/27/2020 Comorbid Anemia, Hypertension, Peripheral Arterial Disease, Peripheral Weeks Of Treatment: 10 History: Venous Disease, Raynauds, Scleroderma, Rheumatoid Arthritis, Clustered Wound: No Osteoarthritis Wound Measurements Length: (cm) 1.3 Width: (cm) 1.4 Depth: (cm) 0.2 Area: (cm) 1.429 Volume: (cm) 0.286 % Reduction in Area: -188.7% % Reduction in Volume: -93.2% Epithelialization: Small (1-33%) Tunneling: No Undermining: No Wound Description Classification: Full Thickness Without Exposed Support Structures Wound Margin: Flat and Intact Exudate Amount: Medium Exudate Type: Purulent Exudate Color: yellow, brown, green Foul Odor After Cleansing: Yes Due to Product Use: No Slough/Fibrino Yes Wound Bed Granulation Amount: None Present (0%) Exposed Structure Necrotic Amount: Large (67-100%) Fascia Exposed: No Necrotic Quality: Adherent Slough Fat Layer (Subcutaneous Tissue) Exposed: Yes Tendon Exposed: No Muscle Exposed: No Joint Exposed: No Bone Exposed: No Treatment Notes Wound #26 (Lower Leg) Wound Laterality: Right, Lateral Cleanser Soap and Water Discharge Instruction: May shower and wash wound with dial antibacterial soap and water prior to dressing change. Peri-Wound Care Sween Lotion (Moisturizing  lotion) Discharge Instruction: Apply moisturizing lotion as directed Topical Primary Dressing KerraCel Ag Gelling Fiber Dressing, 4x5 in (silver alginate) Discharge Instruction: Apply silver alginate to wound bed as instructed Secondary Dressing Woven Gauze Sponge, Non-Sterile 4x4 in Discharge Instruction: Apply over primary dressing as directed. ABD Pad, 8x10 Discharge Instruction: Apply over primary dressing as directed. Secured With Compression Wrap ThreePress (3 layer compression wrap) Discharge Instruction: Apply three layer compression as directed. Compression Stockings Add-Ons Electronic Signature(s) Signed: 01/12/2021 5:28:50 PM By: Levan Hurst RN, BSN Entered By: Levan Hurst on 01/12/2021 12:37:29 -------------------------------------------------------------------------------- Wound Assessment Details Patient Name: Date of Service: [name] 01/12/2021 11:30 A M Medical Record Number: [id number] Patient Account Number: [id number] Date of Birth/Sex: Treating RN: 1951/01/06 (70 y.o. [name] [name] Primary Care Eily Louvier: [name] Other Clinician: Referring Edvardo Honse: Treating Lynnel Zanetti/Extender: [name] in Treatment: [place] Wound Status Wound Number: 27 Primary Venous Leg Ulcer Etiology: Wound Location: Right, Distal, Anterior Lower Leg Wound Open Wounding Event: Gradually Appeared Status: Date Acquired: 11/07/2020 Comorbid Anemia, Hypertension, Peripheral Arterial Disease, Peripheral Weeks Of Treatment: 9 History: Venous Disease, Raynauds, Scleroderma, Rheumatoid Arthritis, Clustered Wound: No Osteoarthritis Wound Measurements Length: (cm) 2 Width: (cm) 0.6 Depth: (cm) 0.1 Area: (cm) 0.942 Volume: (cm) 0.094 % Reduction in Area: -232.9% % Reduction in Volume: -235.7% Epithelialization: Small (1-33%) Tunneling: No Undermining: No Wound Description Classification: Full Thickness Without Exposed Support  Structures Wound Margin: Flat and Intact Exudate Amount: Medium Exudate Type: Serous Exudate Color: amber Foul Odor After Cleansing: No Slough/Fibrino Yes Wound Bed Granulation Amount: Small (1-33%) Exposed Structure Granulation Quality: Pink Fascia Exposed: No Necrotic Amount: Large (67-100%) Fat Layer (Subcutaneous Tissue) Exposed: Yes Necrotic Quality: Adherent Slough Tendon Exposed: No Muscle Exposed: No Joint Exposed: No Bone Exposed: No Treatment Notes Wound #27 (Lower Leg) Wound Laterality: Right, Anterior, Distal Cleanser Soap and Water Discharge Instruction: May shower and wash wound with dial antibacterial soap and water prior to dressing change. Peri-Wound Care Sween Lotion (Moisturizing lotion) Discharge Instruction: Apply moisturizing lotion as directed Topical Primary Dressing KerraCel Ag Gelling Fiber Dressing, 4x5 in (silver alginate) Discharge Instruction: Apply silver alginate to wound bed as instructed Secondary Dressing Woven Gauze Sponge, Non-Sterile 4x4 in Discharge Instruction: Apply over primary dressing as directed. ABD Pad, 8x10 Discharge Instruction: Apply over primary dressing as directed. Secured With Compression Wrap ThreePress (3 layer compression wrap) Discharge Instruction: Apply three layer compression as directed. Compression Stockings Add-Ons Electronic Signature(s) Signed: 01/12/2021 5:28:50  PM By: Levan Hurst RN, BSN Entered By: Levan Hurst on 01/12/2021 12:38:52 -------------------------------------------------------------------------------- Wound Assessment Details Patient Name: Date of Service: [name], [name] 01/12/2021 11:30 A M Medical Record Number: [id number] Patient Account Number: [id number] Date of Birth/Sex: Treating RN: 1951/12/31 (70 y.o. [name] [name] Primary Care Kyheem Bathgate: [name] Other Clinician: Referring Jayonna Meyering: Treating Kaidance Pantoja/Extender: [name] in  Treatment: [place] Wound Status Wound Number: 29 Primary Arterial Insufficiency Ulcer Etiology: Wound Location: Right, Proximal, Lateral Lower Leg Wound Open Wounding Event: Gradually Appeared Status: Date Acquired: 12/14/2020 Comorbid Anemia, Hypertension, Peripheral Arterial Disease, Peripheral Weeks Of Treatment: 3 History: Venous Disease, Raynauds, Scleroderma, Rheumatoid Arthritis, Clustered Wound: No Osteoarthritis Wound Measurements Length: (cm) 1.5 Width: (cm) 1.1 Depth: (cm) 0.1 Area: (cm) 1.296 Volume: (cm) 0.13 % Reduction in Area: -10% % Reduction in Volume: 81.6% Epithelialization: Small (1-33%) Tunneling: No Undermining: No Wound Description Classification: Full Thickness Without Exposed Support Structures Wound Margin: Flat and Intact Exudate Amount: Medium Exudate Type: Purulent Exudate Color: yellow, brown, green Foul Odor After Cleansing: No Slough/Fibrino Yes Wound Bed Granulation Amount: None Present (0%) Exposed Structure Necrotic Amount: Large (67-100%) Fascia Exposed: No Necrotic Quality: Adherent Slough Fat Layer (Subcutaneous Tissue) Exposed: Yes Tendon Exposed: No Muscle Exposed: No Joint Exposed: No Bone Exposed: No Treatment Notes Wound #29 (Lower Leg) Wound Laterality: Right, Lateral, Proximal Cleanser Soap and Water Discharge Instruction: May shower and wash wound with dial antibacterial soap and water prior to dressing change. Peri-Wound Care Sween Lotion (Moisturizing lotion) Discharge Instruction: Apply moisturizing lotion as directed Topical Primary Dressing KerraCel Ag Gelling Fiber Dressing, 4x5 in (silver alginate) Discharge Instruction: Apply silver alginate to wound bed as instructed Secondary Dressing Woven Gauze Sponge, Non-Sterile 4x4 in Discharge Instruction: Apply over primary dressing as directed. ABD Pad, 8x10 Discharge Instruction: Apply over primary dressing as directed. Secured With Compression Wrap ThreePress  (3 layer compression wrap) Discharge Instruction: Apply three layer compression as directed. Compression Stockings Add-Ons Electronic Signature(s) Signed: 01/12/2021 5:28:50 PM By: Levan Hurst RN, BSN Entered By: Levan Hurst on 01/12/2021 12:40:00 -------------------------------------------------------------------------------- Wound Assessment Details Patient Name: Date of Service: [name] 01/12/2021 11:30 A M Medical Record Number: [id number] Patient Account Number: [id number] Date of Birth/Sex: Treating RN: 1951/07/09 (70 y.o. [name] [name] Primary Care Addie Cederberg: [name] Other Clinician: Referring Goldman Birchall: Treating Vaughn Frieze/Extender: [name] in Treatment: [place] Wound Status Wound Number: 32 Primary Auto-immune Etiology: Wound Location: Right T [place] oe Wound Open Wounding Event: Gradually Appeared Status: Date Acquired: 01/05/2021 Comorbid Anemia, Hypertension, Peripheral Arterial Disease, Peripheral Weeks Of Treatment: 0 History: Venous Disease, Raynauds, Scleroderma, Rheumatoid Arthritis, Clustered Wound: No Osteoarthritis Wound Measurements Length: (cm) 1.1 Width: (cm) 0.9 Depth: (cm) 0.1 Area: (cm) 0.778 Volume: (cm) 0.078 % Reduction in Area: 0% % Reduction in Volume: 0% Epithelialization: Small (1-33%) Tunneling: No Undermining: No Wound Description Classification: Full Thickness Without Exposed Support Structures Wound Margin: Flat and Intact Exudate Amount: Small Exudate Type: Serous Exudate Color: amber Foul Odor After Cleansing: No Slough/Fibrino No Wound Bed Granulation Amount: Small (1-33%) Exposed Structure Granulation Quality: Pink Fascia Exposed: No Necrotic Amount: Large (67-100%) Fat Layer (Subcutaneous Tissue) Exposed: Yes Necrotic Quality: Adherent Slough Tendon Exposed: No Muscle Exposed: No Joint Exposed: No Bone Exposed: No Treatment Notes Wound #32 (Toe Great) Wound Laterality:  Right Cleanser Peri-Wound Care Topical Primary Dressing KerraCel Ag Gelling Fiber Dressing, 4x5 in (silver alginate) Discharge Instruction: Apply silver alginate to wound bed as  instructed Secondary Dressing Woven Gauze Sponges 2x2 in Discharge Instruction: Apply over primary dressing as directed. Secured With Conforming Stretch Gauze Bandage, Sterile 2x75 (in/in) Discharge Instruction: Secure with stretch gauze as directed. Paper Tape, 1x10 (in/yd) Discharge Instruction: Secure dressing with tape as directed. Compression Wrap Compression Stockings Add-Ons Electronic Signature(s) Signed: 01/12/2021 5:28:50 PM By: Levan Hurst RN, BSN Entered By: Levan Hurst on 01/12/2021 12:41:26 -------------------------------------------------------------------------------- Wound Assessment Details Patient Name: Date of Service: [name] 01/12/2021 11:30 A M Medical Record Number: [id number] Patient Account Number: [id number] Date of Birth/Sex: Treating RN: 08/16/1951 (70 y.o. [name] [name] Primary Care Atlas Kuc: [name] Other Clinician: Referring Griffyn Kucinski: Treating Izaah Westman/Extender: [name] in Treatment: [place] Wound Status Wound Number: 5 Primary Venous Leg Ulcer Etiology: Wound Location: Right, Medial Lower Leg Wound Open Wounding Event: Gradually Appeared Status: Date Acquired: 01/13/2013 Comorbid Anemia, Hypertension, Peripheral Arterial Disease, Peripheral Weeks Of Treatment: 416 History: Venous Disease, Raynauds, Scleroderma, Rheumatoid Arthritis, Clustered Wound: Yes Osteoarthritis Wound Measurements Length: (cm) 2.5 Width: (cm) 2.5 Depth: (cm) 0.2 Area: (cm) 4.909 Volume: (cm) 0.982 % Reduction in Area: -247.2% % Reduction in Volume: -596.5% Epithelialization: None Tunneling: No Undermining: No Wound Description Classification: Full Thickness With Exposed Support Structures Wound Margin: Flat and Intact Exudate  Amount: Medium Exudate Type: Serous Exudate Color: amber Wound Bed Granulation Amount: None Present (0%) Necrotic Amount: Large (67-100%) Necrotic Quality: Adherent Slough Foul Odor After Cleansing: No Slough/Fibrino Yes Exposed Structure Fascia Exposed: No Fat Layer (Subcutaneous Tissue) Exposed: Yes Tendon Exposed: No Muscle Exposed: No Joint Exposed: No Bone Exposed: No Treatment Notes Wound #5 (Lower Leg) Wound Laterality: Right, Medial Cleanser Soap and Water Discharge Instruction: May shower and wash wound with dial antibacterial soap and water prior to dressing change. Peri-Wound Care Sween Lotion (Moisturizing lotion) Discharge Instruction: Apply moisturizing lotion as directed Topical Primary Dressing KerraCel Ag Gelling Fiber Dressing, 4x5 in (silver alginate) Discharge Instruction: Apply silver alginate to wound bed as instructed Secondary Dressing Woven Gauze Sponge, Non-Sterile 4x4 in Discharge Instruction: Apply over primary dressing as directed. ABD Pad, 8x10 Discharge Instruction: Apply over primary dressing as directed. Secured With Compression Wrap ThreePress (3 layer compression wrap) Discharge Instruction: Apply three layer compression as directed. Compression Stockings Add-Ons Electronic Signature(s) Signed: 01/12/2021 5:28:50 PM By: Levan Hurst RN, BSN Entered By: Levan Hurst on 01/12/2021 12:41:48 -------------------------------------------------------------------------------- Vitals Details Patient Name: Date of Service: [name], [name] 01/12/2021 11:30 A M Medical Record Number: [id number] Patient Account Number: [id number] Date of Birth/Sex: Treating RN: 25-Nov-1951 (70 y.o. [name] [name] Primary Care Nakeysha Pasqual: [name] Other Clinician: Referring Boykin Baetz: Treating Torrie Namba/Extender: [name] in Treatment: 459 Vital Signs Time Taken: 11:32 Temperature (F): 98.5 Height (in): 68 Pulse (bpm):  75 Weight (lbs): 132 Respiratory Rate (breaths/min): 18 Body Mass Index (BMI): 20.1 Blood Pressure (mmHg): 152/71 Reference Range: 80 - 120 mg / dl Electronic Signature(s) Signed: 01/12/2021 5:28:50 PM By: Levan Hurst RN, BSN Signed: 01/12/2021 5:28:50 PM By: Levan Hurst RN, BSN Entered By: Levan Hurst on 01/12/2021 11:59:10

## 2021-01-12 NOTE — Addendum Note (Signed)
Addended by: Mady Haagensen on: 01/12/2021 03:13 PM   Modules accepted: Orders

## 2021-01-12 NOTE — Progress Notes (Signed)
[name], [name] ([id number]) Visit Report for 01/12/2021 Debridement Details Patient Name: Date of Service: [name] LO [name] [name] 01/12/2021 11:30 A M Medical Record Number: [id number] Patient Account Number: [id number] Date of Birth/Sex: Treating RN: 07-18-1951 (70 y.o. [name] [name], Meta.Reding Primary Care Provider: [name] Other Clinician: Referring Provider: Treating Provider/Extender: [name] Kelp in Treatment: 459 Debridement Performed for Assessment: Wound #23 Right,Posterior Lower Leg Performed By: Physician Ricard Dillon., MD Debridement Type: Debridement Severity of Tissue Pre Debridement: Fat layer exposed Level of Consciousness (Pre-procedure): Awake and Alert Pre-procedure Verification/Time Out Yes - 12:04 Taken: Start Time: 12:04 Pain Control: Lidocaine Injectable : 1 T Area Debrided (L x W): otal 9 (cm) x 5.1 (cm) = 45.9 (cm) Tissue and other material debrided: Viable, Non-Viable, Slough, Subcutaneous, Slough Level: Skin/Subcutaneous Tissue Debridement Description: Excisional Instrument: Blade, Curette, Forceps Specimen: Tissue Culture Number of Specimens T aken: 1 Bleeding: Minimum Hemostasis Achieved: Pressure End Time: 12:08 Procedural Pain: 3 Post Procedural Pain: 0 Response to Treatment: Procedure was tolerated well Level of Consciousness (Post- Awake and Alert procedure): Post Debridement Measurements of Total Wound Length: (cm) 9 Width: (cm) 5.1 Depth: (cm) 0.6 Volume: (cm) 21.63 Character of Wound/Ulcer Post Debridement: Requires Further Debridement Severity of Tissue Post Debridement: Fat layer exposed Post Procedure Diagnosis Same as Pre-procedure Electronic Signature(s) Signed: 01/12/2021 5:01:02 PM By: Linton Ham MD Signed: 01/12/2021 5:39:22 PM By: Deon Pilling Entered By: Linton Ham on 01/12/2021 12:23:58 -------------------------------------------------------------------------------- HPI Details Patient  Name: Date of Service: [name] [name], [name] 01/12/2021 11:30 A M Medical Record Number: [id number] Patient Account Number: [id number] Date of Birth/Sex: Treating RN: [birth year] (70 y.o. [name] [name] Primary Care Provider: [name] Other Clinician: Referring Provider: Treating Provider/Extender: [name] Kelp in Treatment: 29 History of Present Illness HPI Description: this is a patient who initially came to [place] for wounds on the medial malleoli bilaterally as well as her upper medial lower extremities bilaterally. These wounds eventually healed with assistance of Apligraf's bilaterally. While this was occurring she developed the current wound which opened into a fairly substantial wound on the right lower extremity. These are mostly secondary to venous stasis physiology however the patient also has underlying scleroderma, pulmonary hypertension. The wound has been making good progress lately with the Hydrofera Blue-based dressing. 03/17/2015; patient had a arterial evaluation a year ago. Her right ABI was 0.86 left was 1.0. T brachial index was 0.41 on the right 0.45 on the left. Her oe bilateral common femoral artery waveforms were triphasic. Her white popliteal posterior tibial artery and anterior tibial artery waveforms were monophasic with good amplitude. Luteal artery waveforms were biphasic it was felt that her bilateral great toe pressures are of normal although adequate for tissue healing. 03/24/2015. The condition of this wound is not really improved that. He was covered with as fibrinous surface slough and eschar. This underwent an aggressive debridement with both a curette and scalpel. I still cannot really get down to what I can would consider to be a viable surface. There is no evidence of infection. Previous workup for ischemia roughly a year ago was negative nevertheless I think that continues to be a concern 04/07/15. The patient arrived for application of  second Apligraf. Once again the surface of this wound is certainly less viable than I would like for an advanced treatment option. An aggressive debridement was done. She developed some arteriolar bleeding which required that along pressure and silver alginate. 04/21/15:  change in the condition of this wound. Once again it is covered in a gelatinous surface slough. After debridement today surface of the wound looks somewhat better but now a heeling surface 05/05/15 Apligraf #4 applied.Still a lot of slough on this wound. 05/19/15 Apligraf #5 applied. Still a lot of slough on this wound I did not aggressively remove this 06/02/15; continued copious amounts of surface slough. This debridement fairly easily. 06/08/2015 -- the last time she had a venous duplex study done was over 3 years ago and the surgery was prior to that. I have recommended that she sees Dr. early for a another opinion regarding a repeat venous duplex and possibly more endovenous ablation of vein stripping of micro-phlebotomies. 06/16/15; wound has a gelatinous surface eschar that the debridement fairly easily to a point. I don't disagree with the venous workup and perhaps even arterial re-evaluation. She is on prednisone 5 mg and continue his medications for her pulmonary artery hypertension I am not sure if the latter have any wound care healing issues I would need to investigate this. 06/23/15 continues with a gelatinous surface eschar with of fibrinous underlying. What I can see of this wound does not look unhealthy however I just can't get this material which I think is 2 different layers off. Empiric culture done 06/30/15; unfortunately not a lot of change in this wound. A gelatinous surface eschar is easily removed however it has a tight fibrinous surface underneath the. 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 The wound comes back and basically and unchanged state the. She has a gelatinous surface that is more easily  removed however there is a tightly fibrinous surface underneath the. There is no evidence of infection. She has a vascular follow-up next month. I would have to inject her in order to do a more aggressive debridement of this area 07/21/15 the wound is roughly in the same state albeit the debridement is done with greater ease. There is less of the fibrinous eschar underneath the. There is no evidence of infection. She has follow-up with vascular surgery next week. No evidence of surrounding infection. Her original distal wounds healed while this one formed. 07/28/15; wound is easier to debride. No wound erythema. She is seeing Dr. [name] next week. 08/18/15 Has been to [place] for repeat ablation. Have been using medihoney pad with some improvement 08/25/15; absolutely no change in the condition of this wound in either its overall size or surface condition in many months now. At one point I had this down to [place] healthier surface I think with [facility] however this did not actually progress towards closure. Do not believe that the wound has actually deteriorated in terms of volume at all. We have been using a medihoney pad which allows easier debridement of the gelatinous eschar but again no overall actual improvement. the patient is going towards an ablation with pain and vascular which I think is scheduled for next week. The only other investigation that I could first see would be a biopsy. She does have underlying scleroderma 09/02/15 eschar is much easier to debridement however the base of this does not look particularly vibrant. We changed to Iodoflex. The patient had her ablation earlier this week 09/09/15 again the debridement over the base of this wound is easier and the base of the wound looks considerably better. We will continue the Iodoflex. Dr. Tawni Millers has expressed his satisfaction with the result of her ablation 09/15/15 once again the  wound is relatively free of surface eschar. No  debridement was done today. It has a pale-looking base to it. although this is not as deep as it once was it seems to be expanding especially inferiorly. She has had recent venous ablation but this is no closer to healing.I've gone ahead and done a punch biopsy this from the inferior part of the wound close to normal skin 09/22/15: the wound is relatively free of surface eschar. There is some surrounding eschar. I'm not exactly sure at what level the surface is that I am seeing. 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 No evidence of atypical infection, malignancy. 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 No debridement was done 09/29/15; the wound surface is relatively free of surface eschar. There is eschar surrounding the walls of the wound. Aside from the improvement in the amount of surface slough. This wound has not progressed any towards closure. There is not even a surface that looks like there at this is ready. There is no evidence of any infection nor maligancy based on biopsy I did on 9/15. I continued with the Iodoflex however I am looking towards some alternative to try to promote some closure or filling in of this surface. Consider triple layer Oasis. Collagen did not result in adequate control of the surface slough 10/13/15; the patient was in hospital last week with severe anemia. The wound looks somewhat better after debridement although there is widening medially. There is no evidence of infection. 10/20/15; patient's wound on the right lateral lower leg is essentially unchanged. This underwent a light surface debridement and in general the debridement is easier and the surface looks improved. I noted in doing this on the side of the wound what appears to be a piece of calcium deposition. The patient noted that she had previous things on the tips of her fingers. 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 She follows with rheumatology and I have asked them to talk to her about this. In view of that S the nonhealing ulcer may have something to do with calcinosis and also unrelieved Raynaud's disease in this area. I should note that her original wounds on the right and left medial malleolus and the inner aspects of both legs just below the knees did however heal over 10/30/15; the patient's wound on the right lateral lower leg is essentially unchanged. I was able to remove some calcified material from the medial wound edge. 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 Otherwise the wound appears essentially unchanged there is less adherent surface eschar. Some of the calcified material was sent to pathology for analysis 11/17/15. The patient's wound on the right lateral leg is essentially unchanged. Wider Medially. 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 There is some surface slough but I did not debridement this. No evidence of surrounding infection. 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 This will make healing difficult 12/15/15; the wound has less depth surface has a fibrinous slough and calcifications in the wound edge no evidence of infection 12/22/15; the wound definitely has less Fibrinous slough on the base. Calcifications around the  wound edges are still evident. Although the wound bed looks healthier it is still pale in appearance. Previous biopsy did not show malignancy 01/04/15; surgical debridement of nonviable slough and subcutaneous tissue the wound cleans up quite nicely but appears to be expanding  outward calcifications around the wound edges are still there. 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 She is to see her rheumatologist I'll see if he has any opinions on this. My punch biopsy done in September did not show calcifications although these are clearly evident. 01/19/16 light selective debridement of nonviable surface slough. There is epithelialization medially. This gives me reason for cautious optimism. She has been to see her rheumatologist, there is nothing that can be done for this type of soft tissue calcification associated with scleroderma 02/02/16 no debridement although there is a light surface slough. She has 2 peninsulas of skin 1 inferiorly and one medially. We continue to make a slight and slow but definite progressive here 02/16/16; light surface debridement with more attention to the circumference of the wound bed where the fibrinous eschar is more prevalent. No calcifications detected. She seems to have done nicely with the [facility] with some epithelialization and some improvement in the overall wound volume. 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 Continued attention to the circumference of the wound where the fibrinous eschar in calcinosis or prevalent. No calcifications were detected. She has continued improvement with Hydrofera Blue. The wound is no longer as deep 03/15/16 surgical debridement done to remove surface escha Especially around the circumference of the wound where there is nonviable subcutaneous tissue. In spite of this there is considerable improvement in the overall dimensions and depth of the wound. Islands of epithelialization are seen especially medially inferiorly and superiorly to a lesser extent. She is using Hydrofera Blue at home 03/29/16; surgical debridement done to remove  surface eschar and nonviable subcutaneous tissue. This cleans up quite nicely mention slightly larger in terms of length and width however depth is less 04/12/16; continued gradual improvement in terms of depth and the condition of the wound base. Debridement is done. Continuing long standing Hydrofera Blue at home with Kerlix Coban wraps 04/26/16; continued gradual improvement in terms of depth and management as well as condition of the wound base. Surgical debridement done she continues with Hydrofera Blue. 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 She initially came to this clinic venous insufficiency ulcers which have long since healed 05/17/16 continued improvement in terms of the depth and measurements of this wound although she has a tightly adherent fibrinous slough each time. We've been continued with long standing Hydrofera Blue which seems to done as well for this wound is many advanced treatment options. 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 She also has chronic venous insufficiency. She has an irritation on her lateral right ankle secondary to our wraps 05/10/16; wound appears to be smaller especially on the medial aspect and especially in the width. Wound was debridement surface looks better. She is also been in the hospital apparently with anemia again she tells me she had an endoscopy. Since she got home after 3 days which I believe was sometime last week she has had an irritated painful area on the right lateral ankle surrounding the lateral malleolus 05/31/16; much more adherent surface slough  today then recently although I don't think the dimensions of changed that much. A more aggressive debridement is required. The irritated area over her medial malleolus is more pruritic and painful and I don't think represents cellulitis 06/14/16; no major change in her wound dimensions however there is more tightly  adherent surface slough which is disappointing. As well as she appears to have a new small area medially. Furthermore an irritated uncomfortable area on the lateral aspect of her right foot just below the lateral malleolus. 06/21/16; I'm seeing the patient back in follow-up for the new areas under her major wound on the right anterior leg. She has been using Hydrofera Blue to this area probably for several months now and although the dressings seem to be helping for quite a period of time I think things have stagnated lately. She comes in today with a relatively tight adherent surface slough and really no changes in the wound shape or dimension. The 2 small areas she had inferiorly are tiny but still open they seem improved this well. There is no uncontrolled edema and I don't think there is any evidence of cellulitis. 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 Patient has had vascular evaluations both venous and arterial. I have biopsied this area. There is no obvious infection. The worrisome thing today is that she seems to be developing areas of erythema and epithelial damage on the medial aspect of the right foot. Also to a lesser degree inferior to the actual wound itself. Again I see no obvious changes to suggest cellulitis however as this is the only treatable option I will probably give her antibiotics. 07/13/16 no major change in the lady's large anterior right leg wound. Still covered with a very tightly adherent surface slough which is difficult to debridement. There is less erythema around this, culture last week grew pseudomonas I gave her ciprofloxacin. The area on her lateral right malleolus looks better- 08/02/16 the patient's wounds continued to decline. Her original large anterior right leg wound looks deeper. Still adherent surface slough that is difficult to debridement. She has a  small area just below this and a punched-out wound just below her lateral malleolus. In the meantime she is been in hospital with apparently an upper GI bleed on Plavix and aspirin. She is now just on Plavix she received 3 units of packed cells 08/23/16; since I last saw this 3 weeks ago, the open large area on her right leg looks about the same syrup. She has a small satellite lesion just underneath this. The area on her medial right ankle is now a deep necrotic wound. I attempted to debridement this however there is just too much pain. 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 She follows with vascular surgery and is scheduled for an angiogram in early September 09/06/16; the patient is going for an arteriogram tomorrow. Her original large wound on the right calf is about the same the satellite lesion underneath it is about the same however the area on her medial ankle is now deeper with exposed tendon. I am no longer attempting to debride these wounds 09/20/16; the patient has undergone a right femoral endarterectomy and Dacron patch angioplasty. This seems to have helped the flow in her right leg. 10/04/16; Arrived today for aggressive debridement of the wounds on her  right calf the original wound the one beneath it and a difficult area over her right lateral leg just above the lateral malleolus. 10/25/16; her 3 open wounds are about the same in terms of dimensions however the surface appears a lot healthier post debridement. Using Iodoflex 10/18/16 we have been using Iodoflex to her wounds which she tolerated with some difficulty. 10/11/16; has been using Santyl for a period of time with some improvement although again very adherent surface slough would prevent any attempted healing this. She has a original wound on the left calf, the satellite underneath that and the most recent wound on the right medial ankle. She has  completed revascularization by Dr. early and has had venous ablation earlier. Want to go back to Iodoflex to see if week and get a healthier surface to this wound bed failing this I think she'll need to be taken to the OR and I am prepared to call Dr. Marla Roe to discuss this. She is obviously not a good candidate for general anesthesia however.; 11/08/16; I put her on Iodoflex last time to see if I can get the wound bed any healthier and unfortunately today still had tremendous surface slough. 11/15/16; 4 weeks' worth of Iodoflex with not much improvement. Debridement on the major wounds on her left anterior leg is easier however this does not maintain from week to week. The punched-out area on her medial right ankle 11/29/16; I attempted to change the patient last visit to [facility] however she states this burned and was very uncomfortable therefore we gave her permission to go back to the eye out of complex which she already had at home. Also she noted a lot of pain and swelling on the lateral aspect of her leg before she traveled to [facility] for the holidays, I called her in doxycycline over the phone. This seems to have helped 12/06/16; Wounds unchanged by in large. Using Iodoflex 12/13/16; her wounds today actually looks somewhat better. The area on the right lateral lower leg has reasonably healthy-looking granulation and perhaps as actually filled and a bit. Debridement of the 2 wounds on the medial calf is easier and post debridement appears to have a healthier base. We have referred her to Dr. Migdalia Dk for consideration of operative debridement 12/20/16; we have a quick note from Dr. Merri Ray who feels that the patient needs to be referred to an academic center/plastic surgeon. This is due to the complexity of the patient's medical issues as it applies to anything in the OR. We have been using Iodoflex 12/27/16; in general the wounds on the right leg are better  in terms of the difficult to remove surface slough. She has been using Iodoflex. She is approved for Apligraf which I anticipated ordering in the next week or 2 when we get a better-looking surface 01/04/16 the deep wounds on the right leg generally look better. Both of them are debrided further surface slough. The area on the lateral right leg was not debrided. She is approved for Apligraf I think I'll probably order this either next week or the week after depending on the surface of the wounds superiorly. We have been using Iodoflex which will continue until then 01/11/16; the deep wounds on the right leg again have a surface slough that requires debridement. I've not been able to get the wound bed on either one of these wounds down to what would be acceptable for an advanced skin stab-like Apligraf. The area on the medial leg has  been improving. We have been using hot Iodoflex to all wounds which seems to do the best at at least limiting the nonviable surface 01/24/17; we have continued Iodoflex and all her wound areas. Her debridement Gen. he is easier and the surface underneath this looks viable. Nevertheless these are large area wounds with exposed muscle at least on the anterior parts. We have ordered Apligraf's for 2 weeks from now. The patient will be away next week 02/07/17; the patient was close to have first Apligraf today however we did not order one. I therefore replaced her Iodoflex. She essentially has 3 large punched- out areas on her right anterior leg and right medial ankle. 02/11/17; Apligraf #1 02/25/17; Apligraf #2. In general some improvement in the right medial ankle and right anterior leg wounds. The larger wound medially perhaps some better 03/11/17; Apligraf #3. In general continued improvement in the right medial ankle and the right anterior leg wounds. 03/25/17 Apligraf #4. In general continued improvement especially on the right medial ankle and the lower 04/08/17; Apligraf #5 in  general continued improvement in all wound sites. 04/22/17; post Apligraf #5 her wound beds continued to look a lot better all of them up to the surface of the surrounding skin. Had a caramel-colored slough that I did not debrided in case there is residual Apligraf effect. The wounds are as good as I have seen these looking quite some time. 04/29/17; we applied [place] and last week after completing her Apligraf. Wounds look as though they've contracted somewhat although they have a nonviable surface which was problematic in the past. Apparently she has been approved for further Apligraf's. We applied Iodosorb today after debridement. 05/06/17; we're fortunate enough today to be able to apply additional Apligraf approved by her insurance. In general all of her wounds look better Apligraf #6 05/24/17; Apligraf #7 continued improvement in all wounds 3 06/10/17 Apligraf #8. Continued improvement in the surface of all wounds. Not much of an improvement in dimensions as I might a follow 06/24/17 Apligraf #9 continued general improvement although not as much change in the wound areas I might of like. She has a new open area on the right anterior lateral ankle very small and superficial. She also has a necrotic wound on the tip of her right index finger probably secondary to severe uncontrolled Raynaud's phenomenon. She is already on sildenafil and already seen her rheumatologist who gave her Keflex. 07/08/17; Apligraf #10. Generalized improvement although she has a small additional wound just medial to the major wound area. 07/22/17; after discussion we decided not to reorder any further Apligraf's although there is been considerable improvement with these it hasn't been recently. The major wound anteriorly looks better. Smaller wounds beneath this and the more recent one and laterally look about the same. The area on the right lateral lower leg looks about the same 07/29/17 this is a patient who is exceedingly  complex. She has advanced scleroderma, crest syndrome including calcinosis, PAD status post revascularization, chronic venous insufficiency status post ablations. She initially presented to this clinic with wounds on her bilateral lower legs however these closed. More recently we have been dealing with a large open area superiorly on the right anterior leg, a smaller wound underneath this and then another one on the right medial lower extremity. These improve significantly with 10 Apligraf applications. Over the last 2-3 weeks we are making good progress with Hydrofera Blue and these seem to be making progress towards closure 08/19/17; wounds continued to  make good improvement with Hydrofera Blue and episodic aggressive debridements 08/26/17; still using Hydrofera Blue. Good improvements 09/09/17; using Hydrofera Blue continued improvement. Area on the lateral part of her right leg has only a small remaining open area. The small inferior satellite region is for all intents and purposes closed. Her major wound also is come in in terms of depth and has advancing epithelialization. 09/16/17; using Hydrofera Blue with continued improvement. The smaller satellite wound. We've closed out today along with a new were satellite wound medially. The area on her medial ankle is still open and her major wound is still open but making improvement. All using Hydrofera Blue. Currently 09/30/17; using Hydrofera Blue. Still a small open area on the lateral right ankle area and her original major wound seems to be making gradual and steady improvement. 10/14/17; still using Hydrofera Blue. Still too small open areas on the right lateral ankle. Her original major wound is horizontal and linear. The most problematic area paradoxically seems to be the area to the medial area wears I thought it would be the lateral. 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 10/28/17; still using  Hydrofera Blue. Right lateral ankle has a very small open area superiorly on the most lateral part of the wound. Her original open wound has 2 open areas now separated by normal skin and we've redefined this. 11/11/17; still using Hydrofera Blue area and right lateral ankle continues to have a small open area on mostly the lateral part of the wound. Her original wound has 2 small open areas now separated by a considerable amount of normal skin 11/28/17; the patient called in slightly before Thanksgiving to report pain and erythema above the wound on the right leg. In the past this is responded well to treatment for cellulitis and I gave her over the phone doxycycline. She stated this resulted in fairly abrupt improvement. We have been using Hydrofera Blue for a prolonged period of time to the larger wound anteriorly into the remaining wound on her right right lateral ankle. The latter is just about closed with only a small linear area and the bottom of the [place]. 12/02/17; use endoform and left the dressing on since last visit. There is no tenderness and no evidence of infection. 12/16/17; patient has been using Endoform but not making much progress. The 2 punched out open areas anteriorly which were the reminiscence of her major wound appear deeper. The area on the lateral aspect of her right calf also appears deeper. Also she has a puzzling tender swelling above her wound on the right leg. This seems larger than last time. Just above her wounds there appears to be some fluctuance in this area it is not erythematous and there is no crepitus 12/30/17; patient has been using Endoform up until last week we used Hydrofera Blue. Ultrasound of the swelling above her 2 major wounds last time was negative for a fluid collection. I gave her cefaclor for the erythema and tenderness in this area which seems better. Unfortunately both punched out areas anteriorly and the area on her right medial lower leg appear  deeper. In fact the lateral of the wounds anteriorly actually looks as though it has exposed tendon and/or muscle sheath. She is not systemically unwell. She is complaining of vaginitis type symptoms presumably Candida from her antibiotics. 01/06/18; we're using santyl. she has 2 punched out areas anteriorly which were initially part of a large wound. Unfortunately medially  this is now open to tendon/muscle. All the wounds have the same adherent very difficult to debride surface. 01/20/18; 2 week follow-up using Santyl. She has the 2 punched out areas anteriorly which were initially part of her large surface wound there. Medially this still has exposed muscle. All of these have the same tightly adherent necrotic surface which requires debridement. PuraPly was not accepted by the patient's insurance however her insurance I think it changed therefore we are going to run Apligraf to gain 02/03/18; the patient has been using Santyl. The wound on the right lateral ankle looks improved and the 2 areas anteriorly on the right leg looks about the same. The medial one has exposed muscle. The lateral 1 requiresdebridement. We use PuraPly today for the first week 02/10/18; PuraPly #2. The patient has 3 wounds. The area on the right lateral ankle, 2 areas anteriorly that were part of her original large wound in this area the medial one has exposed muscle. All of the wounds were lightly debrided with a number 3 curet. PuraPly #2 applied the lateral wound on the calf and the right lateral ankle look better. 02/17/18; PuraPly #3. Patient has 3 wounds. The area on the right lateral ankle in 2 areas internally that were part of her original large wound. The lateral area has exposed muscle. She arrived with some complaints of pain around the right ankle. 02/24/18; PuraPly #4; not much change in any of the 3 wound areas. Right lateral ankle, right lateral calf. Both of these required debridement with a #3 curet. She tolerates  this marginally. The area on the medial leg still has exposed muscle. Not much change in dimensions 03/03/18 PuraPly #5. The area on the medial ankle actually looks better however the 2 separate areas that were original parts of the larger right anterior leg wound look as though they're attempting to coalesce. 03/10/18; PuraPly #6. The area on the medial ankle actually continues to look and measures smaller however the 2 separate areas that were part of the original large wound on the right anterior leg have now coalesced. There hasn't been much improvement here. The lateral area actually has underlying exposed muscle 03/17/18-she is here in follow-up evaluation for ulcerations to the right lower extremity. She is voicing no complaints or concerns. She tolerated debridement. Puraply#7 placed 03/24/18; difficult right lower extremity ulcerations. PuraPly #8 place. She is been approved for [employer]. She did very well with Apligraf today however she is apparently reached her "lifetime max" 03/31/18; marginal improvement with PuraPly although her wounds looked as good as they have in several weeks today. Used TheraSkins #1 04/14/18 TheraSkin #2 today 04/28/18 TheraSkins #3. Wound slightly improved 05/12/18; TheraSkin skin #4. Wound response has been variable. 05/27/18 TheraSkin #5. Generally improvement in all wound areas. I've also put her in 3 layer compression to help with the severe venous hypertension 06/09/18; patient is done quite well with the TheraSkins unfortunately we have no further applications. I also put her in 3 layer compression last week and that really seems to of helped. 06/16/18; we have been using silver collagen. Wounds are smaller. Still be open area to the muscle layer of her calf however even that is contracted somewhat. She tells me that at night sometimes she has pain on the right lateral calf at the site of her lower wound. Notable that I put her into 3 layer compression about 3 weeks  ago. She states that she dangles her leg over the bed that makes it feel  better but she does not describe claudication during the day She is going to call her secondary insurance to see if they will continue to cover advanced treatment products I have reviewed her arterial studies from 01/22/17; this showed an ABI in the right of 1 and on the left noncompressible. TBI on the right at 0.30 on the left at 0.34. It is therefore possible she has significant PAD with medial calcification falsely elevating her ABI into the normal range. I'll need to be careful about asking her about this next week it's possible the 3 layer compression is too much 06/23/18; was able to reapply TheraSkin 1 today. Edema control is good and she is not complaining of pain no claudication 07/07/18;no major change. New wound which was apparently a taper removal injury today in our clinic between her 2 wounds on the right calf TheraSkins #2 07/14/18; I think there is some improvement in the right lateral ankle and the medial part of her wound. There is still exposed muscle medially. 07/28/18; two-week follow-up. TheraSkins#3. Unfortunately no major change. 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 She seems to be doing well and in the base of the wound appears to show some progress at this point. This is the last approved Theraskin of the second set. 08/25/18; she has completed TheraSkin. There has been some improvement on the right lateral calf wound as well as the anterior leg wounds. The open area to muscle medially on the anterior leg wound is smaller. I'm going to transition her back to [facility] under Kerlix Coban change every second day. She reports that she had some calcification removed from her right upper arm. We have had previous  problems with calcifications in her wounds on her legs but that has not happened recently 09/08/18;using Hydrofera Blue on both her wound areas. Wounds seem to of contracted nicely. She uses Kerlix Coban wrap and changes at home herself 09/22/2018; using Hydrofera Blue on both her wound areas. Dimensions seem to have come down somewhat. There is certainly less depth in the medial part of the mid tibia wound and I do not think there is any exposed muscle at this point. 10/06/2018; 2-week follow-up. Using [facility] on her wound areas. Dimensions have come down nicely both on the right lateral ankle area in the right mid tibial area. She has no new complaints 10/20/2018; 2-week follow-up. She is using Hydrofera Blue. Not much change from the last time she was here. The area on the lateral ankle has less depth although it has raised edges on one side. I attempted to remove as much of the raised edge as I could without creating more additional wounds. The area on the right anterior mid tibia area looks the same. 11/03/2018; 2-week follow-up she is using Hydrofera Blue. On the right anterior leg she now has 2 wounds separated by a large area of normal skin. The area on the medial part still has I think exposed muscle although this area itself is a lot smaller. The area laterally has some depth. Both areas with necrotic debris. The area on her right lateral ankle has come in nicely 11/17/2018; patient continues to use Hydrofera Blue. We have been increasing separation of the 2 wounds anteriorly which were at one point joint the area on the right lateral calf continues  to have I think some improvement in depth. 12/08/2018; patient continues to use Hydrofera Blue. There is some improvement in the area on the right lateral calf. The 2 areas that were initially part of the original large wound in the mid right tibia are probably about the same. In fact the medial area is probably somewhat larger. We will run  puraply through the patient's insurance 12/22/2018; she has been using Hydrofera Blue. We have small wounds on the right lateral calf and 2 small areas that were initially part of a large wound in the right mid tibia. We applied pure apply #1 today. 12/29/2018; we applied puraply #2. Her wounds look somewhat better especially on the right lateral calf and the lateral part of her original wound in the mid tibial area. 01/05/2019; perhaps slightly improved in terms of wound bed condition but certainly not as much improvement as I might of liked. Puraply #3 1/13: we did not have a correct sized puraply to apply. wounds more pinched out looking, I increased her compression to 3 layer last week to help with significant multilevel venous hypertension. Since then I've reviewed her arterial status. She has a right femoral endarterectomy and a distal left SFA stent. She was being followed by Dr. [name] for a period however she does not appear to have seen him in 3 years. I will set up an appointment. 1/20. The patient has an additional wound on the right lateral calf between the distal wound and proximal wounds. We did not have Puraply last week. Still does not have a follow-up with Dr. early 1/27: Follow-up with Dr. early has been arranged apparently with follow-up noninvasive studies. Wounds are measuring roughly the same although they certainly look smaller 2/3; the patient had non invasive studies. Her ABIs on the right were 0.83 and on the left 1.02 however there was no great toe pressure bilaterally. Also worrisome monophasic waveforms at the PTA and dorsalis pedis. We are still using Puraply. We have had some improvement in all of the wounds especially the lateral part of the mid tibial area. 2/10; sees Dr. early of vein and vascular re-arterial studies next week. Puraply reapplied today. 2/17; Dr. early of vein and vascular his appointment is tomorrow. Puraply reapplied after debridement of all  wounds 2/24; the patient saw Dr. early I reviewed his note. sHe noted the previous right femoral endarterectomy with a Dacron patch. He also noted the normal ABI and the monophasic waveforms suggesting tibial disease. Overall he did not feel that she had any evidence of arterial insufficiency that would impair her wound healing. He did note her venous disease as well. He suggested PRN follow-up. 3/2; I had the last puraply applied today. The original wounds over the mid tibia area are improved where is the area on the right lower leg is not 3/9; wounds are smaller especially in the right mid tibia perhaps slightly in the right lateral calf. We finished with puraply and went to endoform today 3/23; the patient arrives after 2 weeks. She has been using endoform. I think all of her wounds look slightly better which includes the area on the right lateral calf just above the right lateral malleolus and the 2 in the right mid tibia which were initially part of the same wound. 4/27; TELEHEALTH visit; the patient was seen for telehealth visit today with her consent in the middle of the worldwide epidemic. Since she was last here she called in for antibiotics with pain and tenderness around the  area on the right medial ankle. I gave her empiric doxycycline. She states this feels better. She is using endoform on both of these areas 5/11 TELEHEALTH; the patient was seen for telehealth visit today. She was accompanied at home by her husband. She has severe pulmonary hypertension accompanied scleroderma and in the face of the Covid epidemic cannot be safely brought into our clinic. We have been using endoform on her wound areas. There are essentially 3 wound areas now in the left mid tibia now 2 open areas that it one point were connected and one on the right lateral ankle just above the malleolus. 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 She cannot come to clinic. The wounds were reviewed today via telehealth. She has severe chronic venous hypertension which I think is centrally mediated. She has wounds on her right anterior tibia and right lateral ankle area. These are chronic. She has been using endoform. 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 We have been following her from telehealth. She has severe chronic venous hypertension which may be mostly centrally mediated secondary to right heart heart failure. She has wounds on her right anterior tibia and right lateral ankle these are chronic we have been using endoform 6/22; TELEHEALTH; this patient was seen today via telehealth. 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 Since the last time we had contact with this patient she developed some pain and erythema around the wound on her right lateral malleolus/ankle and we put in antibiotics for her. This is resulted in good improvement with resolution of the erythema and tenderness. I changed her to silver alginate last time. We had been using endoform for an extended period of time 7/13; TELEHEALTH; this patient was seen today via telehealth. 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 She would continue to be at a prohibitive risk to be brought into our clinic unless this was absolutely necessary. These visits have been done with her approval as well as her husband. We have been using silver alginate to the areas on the right mid tibia and right lateral lower leg. 7/27 TELEHEALTH; patient was seen along with her husband today via telehealth. She has severe pulmonary  hypertension secondary to scleroderma on chronic oxygen and would be at risk to bring her into the clinic. We changed her to sample last visit. She has 2 areas a chronic wound on her right mid tibia and one just above her ankle. These were not the original wounds when she came into this clinic but she developed them during treatment 8/17; she comes in for her first face-to-face visit in a long period. She has a remaining area just medial to the right tibia which is the last open part of her large wound across this area. She also has an area on the right lateral lower leg. We prescribed Santyl last telehealth visit but they were concerned that this was making a deeper so they put silver alginate on it last week. Her husband changes the dressings. 8/31; using Santyl to the 2 wound areas some improvement in wound surfaces. Husband has surgery  in 2 weeks we will put her out 3 weeks. Any of the advanced treatment options that I can think of that would be eligible for this wound would also cause her to have to come in weekly. The risk that the patient is just too high 9/21. Using Santyl to the 2 wound areas. Both of these are somewhat better although the medial mid tibia area still has exposed muscle. Lateral ankle requiring debridement. Using Santyl 10/12; using Santyl to the 2 wound areas. One on the right lateral ankle and the other in the medial calf which still has exposed muscle. Both areas have come down in size and have better looking surfaces. She has made nice progress with santyl 11/2; we have been using Santyl to the 2 wound areas. Right lateral ankle and the other in the mid tibia area the medial part of this still has exposed muscle. 11/23/2019 on evaluation today patient appears to be doing about the same really with regard to her wounds. She is actually not very pleased with how things seem to be progressing at this point she tells me that she really has not noted much improvement  unfortunately. With that being said there is no signs of active infection at this time. There is some slough buildup noted at this time which again along with some dry skin around the edges of the wound I think would benefit her to try to debride some of this away. Fortunately her pain is doing fairly well. She still has exposed muscle in the right medial/tibial area. 12/14; TELEHEALTH; she was changed to [facility] to the right calf wound and right lateral ankle wound when she was here last time. Unfortunately since then she had a fall with a pelvic fracture and a fracture of her wrist. She was apparently hospitalized for 5 days. I have not looked at her discharge summary. She apparently came out of the hospital with a blister on her right heel. She was seen today via telehealth by myself and our case manager. The patient and her husband were present. She has been using Hydrofera Blue at our direction from the last time she was in the clinic. There is been no major improvement in fact the areas appear deeper and with a less viable surface 01/04/2020; TELEHEALTH; the patient was seen today in accompaniment of her husband and our nurse. She has 3 open areas 1 on the right medial mid tibia, one on the right lateral ankle and a large eschared pressure area on the right heel. We have been using Iodoflex to the 2 original wounds. 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 It is simply too perilous for her to be seen in any other way 1/26; TELEHEALTH; the patient was seen today in accompaniment of her husband and our RN one of our nurses. She still has the 3 open areas 1 on the right medial mid tibia which is the remanent of a more extensive wound in this area, one on the right lateral ankle and a large eschared pressure area on the right heel. We have been using Iodoflex to the 2 original wounds and a bed at 9 application to the eschared area on the heel 2/15; the first  time we have seen this patient and then several months out of concern for the pandemic. She had a large horizontal wound in the mid tibia. Only the medial aspect of this is still open. Area on the lateral ankle is just about closed.  She had a new pressure ulcer on the right heel which I have removed some of the eschar. We have been using Iodoflex which I will continue. The area in the mid tibia has a round circle in the middle of exposed muscle. I think we would have to use an advanced treatment product to stimulate granulation over this area. We will run this through her insurance. She is not eligible for plastic surgery for many reasons 3/1; TELEHEALTH. The patient was seen today by telehealth. 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 We have been using Iodoflex to the wound areas which include the right anterior mid tibia, right lateral ankle and the right heel. All of these were reviewed 3/8; the patient's wound just above the right ankle is closed. She still has a contracting black eschar on the heel where she had a pressure ulcer. The medial part of her original wound on the mid tibia has exposed muscle. We have made really made no progress in this area although we have managed to get a lot of the original wound in this area to close. We used Apligraf #1 today 3/22; the patient's ankle wound remains closed. She still has a contracting black eschar on the heel although it seems to have less surface area. It still not clear whether there is any depth here. We used Apligraf #2 today in an attempt to get granulation over the exposed muscle and what is left of her mid tibia area. 4/5; everything is closed except the medial aspect of the mid tibia wound, as well as the pressure injury on the right heel. She has been using Betadine to the right heel which has been gradually contracting. We used Apligraf #3  today. 4/19; Apligraf #4. Still a pressure area on the right heel she has been using Betadine superficial excoriated areas she has been applying salicylic acid to on the anterior leg and the thick horn on the leg just above her wound area 5/3; Apligraf #5. Unfortunately she came in today with a reopening of the area on the right lateral lower leg. Not much change in the wound we have been treating in the mid calf. Quite a bit of edema surrounding this wound. She reports that she was up on her feet quite a bit. The area on the right heel is separating she has been using Betadine She comes in with a new wound on the left lateral malleolus which is very disappointing this is been open since last week 5/17; we applied her last Apligraf last time. Unfortunately that did not have any effect on the deep area medially in the mid tibial area. However the area over the right lateral malleolus is a lot better. Right heel also is contracted we used Iodoflex last time. The new wound on the left lateral malleolus were using Santyl to. This required debridement. 6/1. Not much change in the area on the right medial mid tibia. Right lateral ankle is improved she has the necrotic area on the right heel which was a pressure area. New area on the left lateral malleolus from last time. I changed her to Iodoflex to the area on the left lateral malleolus she said this hurt I have put her back on Santyl 6/15; right mid tibia is unchanged. I do not think there is anything I can do to this topically that we will get this to granulate over the muscle. And  I am furthermore I am not sure that she is a surgical candidate either because of her severe pulmonary issues or because she really does not want to go through it. The area on the right lateral malleolus is a small punched out area. The area on the left lateral malleolus again small punched out with nonviable surface Pressure ulcer on the right heel at separating black  eschar. I went ahead and remove this today she has an area on the left anterior mid tibia just laterally. Geographic wounds debris on the surface. Some erythema here. 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 There is skin changes widely Change the primary dressing to Sorbact to all wound areas. She needs compression on the left leg as well as the right 6/21; right mid tibia which was her one remaining wound at 1 point is unchanged The area on the right lateral malleolus actually is close to closing over still a small punched out area The area on the left lateral malleolus again a deep wound it is this 1 that she feels pain in. Pressure ulcer on the right heel was cultured today. New area on the left anterior mid tibia several geographic wounds last week in the setting of chronic venous insufficiency this actually looks some better 7/6; Right mid tibia about the same. Exposed muscle Left lateral malleolus again necrotic debris over the surface. Pressure ulcer on the right heel. She finished the Keflex we prescribed. Necrotic debris debrided with a #3 curette The rest of her wounds on the right mid tibia right lateral ankle are better Her husband reminds me that she had a right femoral endarterectomy and has 2 stents in her left thigh placed in 2011 approximately by vascular surgery in [place]. She saw Dr. [name] last in February 2020. At that point he did not think that the arterial insufficiency she had on the right was sufficient to explain any of her right lower extremity wounds however she now has an area on the left lateral malleolus that looks like an ischemic wound 7/12; we have been using Sorbact all wound areas Right mid tibia about the same exposed muscle Left lateral malleolus small wound with debris in the surface punched out Pressure ulcer of the right heel requiring debridement of necrotic debris Lateral ankle wound on the right is just about closed Right  mid tibia wound about the same still with exposed muscle. 7/26 really no change in any wounds. We have been using Sorbact. She has bilateral lower extremity wounds. This is in the setting of scleroderma, severe venous hypertension. She also has known PAD and I had really hoped to be able to get a review by Dr. [name] but apparently that is not booked until August 31. I have asked her to call back to vein and vascular and get an appointment with somebody a little earlier if that is possible. 8/16; patient's area on the right ankle which was a chronic wound is closed over again. The area on the right mid lower leg, right heel left lateral ankle are all about the same. Pressure ulcer on the right heel is still not viable. New areas identified on the right lateral calf. She has a follow-up with Dr. [name] on 8/31. Phenomenally I would like him to go over her arterial status with regards to her underlying stents. 9/13; patient had her ARTERIAL studies done however apparently Dr. [name] is now in [place] so she did not get an appointment with him on 8/31  and was not referred to any other provider. On the right side her PTA was monophasic. Noncompressible. TBI on the right at 0.41 on the left she was monophasic and noncompressible they did not do a TBI because she had a Band-Aid on her big toe. Patient does not describe claudication although she is having a lot of pain in the left lateral malleolus. 10/11; the patient had a right-sided interventions on 10/5. She had a angioplasty of the right anterior tibial artery as well as the tibioperoneal trunk/peroneal artery she had a drug-coated balloon angioplasty of the right superficial femoral artery. On the left she did not have any interventions yet but is going to have another look at this in 2 weeks. The superficial femoral artery on the left and the associated stent are patent popliteal artery is patent to the dominant vessel runoff is the anterior tibial  which has several high-grade stenosis in the proximal one third. We have been using Sorbact. She also has a new area on the right upper mid tibia. 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 She tolerated this well. She is using Iodoflex and her husband is doing a kerlix Coban on her. She arrives in clinic today with the original medial part of her mid tibia wound with muscle exposure small area on the right ankle which was part of her original wound she has 2 punched-out areas laterally and 1 above her area anteriorly. Nonviable surfaces similarly the right heel is nonviable. On the left side she has an area on the left foot and the left lateral ankle similar nonviable surfaces. 11/8; patient arrives in clinic today with nothing improved. She has a multiple lightly group of wounds on the right lower leg presumably different etiologies including a skin biopsy, the original wound she had medially and some punched-out areas that are not of clear defined etiology. She says she was in a lot of pain this week could not have her leg up on the bed did not get completely relief from dropping it over the bed. She also has a small area on the left lateral malleolus and the left lateral foot necrotic surfaces on all of these wounds. We have been using Iodoflex I attempted to put her in 3 layer compression last week that did not go well. We will be backing off on kerlix Coban this week. 11/15; the ultrasound that I sent the patient for last week showed findings consistent with acute deep vein thrombosis involving the right distal femoral vein and right popliteal vein. I was concerned about the combination of Plavix and Xarelto however apparently Dr. [name] felt  that was not a problem. She is now going through the acute dosing of Xarelto. She has an appointment with Dr. Trula Slade on 12/5. 11/29; 10 days ago before he left for weeks vacation she called to report pain in the right leg I sent her in some doxycycline her husband thought things were getting better however she required admission to hospital from 11/22 through 11/24 prompted by finding that her hemoglobin was 5.7 she was transfused 2 units of packed cells and her hemoglobin stabilized. She did undergo an endoscopy with findings of significant esophagitis part of which was candidal and part of it reflux. She was discharged on  nystatin swish and swallow which she is doing. She seems to be better from that regard. There was no active other active source of bleeding Unfortunately the combination of Xarelto and Plavix was felt to be too much for her. The Xarelto was stopped and an IVC filter was placed she is continued on Plavix. Her big complaint today is pain in the right calf really from the mid aspect superiorly. This is very tender slightly red but not particularly warm. She did see vein and vascular in the hospital I will see if I can pull their note 12/6; patient was at Dr. Stephens Shire office today. Great toe pressure on the right is 54 on the left 49 she has a 50 to 74% stenosis of the right mid popliteal on the left at 30 to 49% proximal SFA and a 50 to 74% CFA. She was treated with angioplasty of the right ATA, right TP trunk and peroneal artery as well as an angioplasty of the right SFA to popliteal artery. On 10/26 she underwent angioplasty of the left ATA and left TP trunk she has a history of a left common femoral endarterectomy and left superficial femoral artery stent as well as a right superficial femoral artery stent. In addition to her arterial disease she has significant venous disease with a history of a right greater saphenous vein ablation and a recent DVT in the right femoral and  popliteal vein with insertion of an IVC filter. She has remained on Plavix but her anticoagulant was stopped. She continues to have a lot of swelling and pain in the right leg with significant erythema which I am assuming is stasis dermatitis. She has had substantial wound areas on the right leg including right lateral a large necrotic wound with a smaller biopsy site above this. She has original wound on the left mid tibia with a small area above this as well. On the left she has a small area over the left lateral malleolus and the left lateral foot 12/22-week follow-up. Her right leg looks really terrible. She has another necrotic wound just above her original one on the right posterior leg. Greenish drainage suggestive of gram-negative/Pseudomonas infection. The entire mid part of her leg is erythematous I think most of this is stasis dermatitis rather than cellulitis. She has also a bothersome ischemic-looking right fourth toe which I noticed today without her really complaining. She has not been systemically unwell although she looks more physically frail 12/27; I reached out to vein and vascular last weekend Dr. [name] will be seeing her on Thursday. She has a gangrenous area on the tip of the right fourth toe. Multiple areas breaking down. I think this is a combination of arterial and severe venous insufficiency she has multiple wounds in the right leg but not a lot of pain. She has 3 small areas on the left leg we have been using Santyl. On the right leg we have been using silver alginate or at least that is what they have been using Culture I did last week showed abundant is Pseudomonas aeruginosa had a few Enterococcus faecalis. I have been using Ceftin here predominantly because of the green drainage suggesting Pseudomonas I have not yet addressed the Enterococcus which was ampicillin sensitive 1/7; the patient was kindly seen by vein and vascular. She underwent a repeat ANGIOGRAM on 01/02/2021  by Dr. Donzetta Matters. She appears to have had bilateral common femoral treatments in the past. The right side is heavily heavily calcified and she has 1  area of 50% stenosis in the SFA that is nonflow limiting. She has dominant runoff via the anterior tibial and peroneal arteries. On the left side she also has a heavily calcified SFA no flow-limiting stenosis in the popliteal artery then she she then has runoff via the anterior tibial and posterior tibial arteries without flow-limiting stenosis. She was not felt to require any intervention. She continues to have pain in the right leg with erythema although the erythema is somewhat better. Green drainage coming from the wounds on the posterior lateral right leg which are clearly necrotic. I have had her on 2-1/2 weeks of cefdinir this is while we awaited further arterial evaluation which is completed. I have now turned some thought to the status of her venous system particularly the DVT she had in November. This was an acute DVT involving the right distal femoral and right popliteal veins. She was then started on Xarelto in combination with Plavix but she required hospitalization in November with GI bleeding. She had an endoscopy by Dr. Lizbeth Bark of GI. This showed moderately severe esophagitis a combination of both food particles reflux and Candida. Localized mild inflammation characterized by erythema was found in the cardiac gastric fundus and the gastric body. The duodenal bulb's in the stages of the duodenum were normal. She then underwent a capsule endoscopy which showed evidence of oozing blood from the esophagus but no evidence of small bowel bleeding. She was not restarted on her Xarelto, she continues on Plavix and she has an IVC filter 01/12/2021; we brought this patient in for a nurse visit today however I saw her because of copious amounts of green drainage. Under 3 layer compression the degree of erythema in her lower leg on the right was better  however there is indeed large amounts of green drainage I think coming out of the large wound on the posterior lateral leg. There is also a satellite area above this. She complains of fairly constant pain in fact she called our clinic earlier in the week to report that Sorbact which we are using topically was hurting her. We explained that we had never heard Sorbact do this. She has not been systemically unwell. My thoughts are on this lady that she had recent angiograms and there is nothing further that can be done. She has a gangrenous right fourth toe but there should be enough blood flow to heal wounds on her legs. She did not have another intervention. She is on Plavix with an IVC filter for her previous stents. She had an acute DVT in November I believe and was on Xarelto and Plavix but had a GI bleed of I think esophageal source. She has some swelling in the right thigh marked erythema and swelling in the right mid calf. I wonder whether she is extended the clot burden in her thigh as a cause of the distal DVT like symptoms. Electronic Signature(s) Signed: 01/12/2021 5:01:02 PM By: Linton Ham MD Entered By: Linton Ham on 01/12/2021 12:26:51 -------------------------------------------------------------------------------- Physical Exam Details Patient Name: Date of Service: [name] [name], [name] 01/12/2021 11:30 A M Medical Record Number: [id number] Patient Account Number: [id number] Date of Birth/Sex: Treating RN: June 16, 1951 (70 y.o. [name] [name] Primary Care Provider: [name] Other Clinician: Referring Provider: Treating Provider/Extender: [name] Kelp in Treatment: 70 Constitutional Patient is hypertensive.. Pulse regular and within target range for patient.Marland Kitchen Respirations regular, non-labored and within target range.. Temperature is normal and within the target range for  the patient.Marland Kitchen Appears in no distress. Integumentary (Hair, Skin) There is  still reasonably intense erythema in the right mid lower leg region. Some tenderness. I think a lot of this is stasis dermatitis cannot exactly rule out coexistent cellulitis. Notes Wound exam; Right side copious green drainage which I think is from the large right posterior lateral calf wound. Copious amounts of necrotic debris. I removed some of this with pickups and a #15 scalpel and then a number of #5 curette getting down to a surface that this which was not particularly healthy looking I obtained the tissues sample for PCR culture. She has a necrotic wound above this but this is smaller and I do not think likely to be the source of the drainage Electronic Signature(s) Signed: 01/12/2021 5:01:02 PM By: Linton Ham MD Entered By: Linton Ham on 01/12/2021 12:29:47 -------------------------------------------------------------------------------- Physician Orders Details Patient Name: Date of Service: [name] [name], [name] 01/12/2021 11:30 A M Medical Record Number: [id number] Patient Account Number: [id number] Date of Birth/Sex: Treating RN: 1951-01-01 (70 y.o. 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 I87.331 Chronic venous hypertension (idiopathic) with ulcer and inflammation of right lower extremity L89.616 Pressure-induced deep tissue damage of right heel L97.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 L97.528 Non-pressure chronic ulcer of other part of left foot with other specified  severity I70.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 I70.248 Atherosclerosis of native arteries of left leg with ulceration of other part of lower leg Follow-up Appointments Other: - Keep appt for Tues 1/18 Bathing/ Shower/ Hygiene May shower and wash wound with soap and water. - on days that dressing is changed Edema Control - Lymphedema / SCD / Other Bilateral Lower Extremities Elevate legs to the level of the heart or above for 30 minutes daily and/or when sitting, a frequency of: - throughout the day Avoid standing for long periods of time. Exercise regularly Wound Treatment Wound #16 - Calcaneus Wound Laterality: Right Cleanser: Soap and Water 1 x Per Week/7 Days Discharge Instructions: May shower and wash wound with dial antibacterial soap and water prior to dressing change. Peri-Wound Care: Sween Lotion (Moisturizing lotion) 1 x Per Week/7 Days Discharge Instructions: Apply moisturizing lotion as directed Prim Dressing: KerraCel Ag Gelling Fiber Dressing, 4x5 in (silver alginate) 1 x Per Week/7 Days ary Discharge Instructions: Apply silver alginate to wound bed as instructed Secondary Dressing: Woven Gauze Sponge, Non-Sterile 4x4 in 1 x Per Week/7 Days Discharge Instructions: Apply over primary dressing as directed. Secondary Dressing: ABD Pad, 8x10 1 x Per Week/7 Days Discharge Instructions: Apply over primary dressing as directed. Compression Wrap: ThreePress (3 layer compression wrap) 1 x Per Week/7 Days Discharge Instructions: Apply three layer compression as directed. Wound #19 - Malleolus Wound Laterality: Right, Lateral Cleanser: Soap and Water 1 x Per Week/7 Days Discharge Instructions: May shower and wash wound with dial antibacterial soap and water prior to dressing change. Peri-Wound Care: Sween Lotion (Moisturizing lotion) 1 x Per Week/7 Days Discharge Instructions: Apply moisturizing lotion as directed Prim  Dressing:  KerraCel Ag Gelling Fiber Dressing, 4x5 in (silver alginate) 1 x Per Week/7 Days ary Discharge Instructions: Apply silver alginate to wound bed as instructed Secondary Dressing: Woven Gauze Sponge, Non-Sterile 4x4 in 1 x Per Week/7 Days Discharge Instructions: Apply over primary dressing as directed. Secondary Dressing: ABD Pad, 8x10 1 x Per Week/7 Days Discharge Instructions: Apply over primary dressing as directed. Compression Wrap: ThreePress (3 layer compression wrap) 1 x Per Week/7 Days Discharge Instructions: Apply three layer compression as directed. Wound #23 - Lower Leg Wound Laterality: Right, Posterior Cleanser: Soap and Water 1 x Per Week/7 Days Discharge Instructions: May shower and wash wound with dial antibacterial soap and water prior to dressing change. Peri-Wound Care: Sween Lotion (Moisturizing lotion) 1 x Per Week/7 Days Discharge Instructions: Apply moisturizing lotion as directed Prim Dressing: KerraCel Ag Gelling Fiber Dressing, 4x5 in (silver alginate) 1 x Per Week/7 Days ary Discharge Instructions: Apply silver alginate to wound bed as instructed Secondary Dressing: Woven Gauze Sponge, Non-Sterile 4x4 in 1 x Per Week/7 Days Discharge Instructions: Apply over primary dressing as directed. Secondary Dressing: ABD Pad, 8x10 1 x Per Week/7 Days Discharge Instructions: Apply over primary dressing as directed. Compression Wrap: ThreePress (3 layer compression wrap) 1 x Per Week/7 Days Discharge Instructions: Apply three layer compression as directed. Wound #24 - Lower Leg Wound Laterality: Right, Anterior Cleanser: Soap and Water 1 x Per Week/7 Days Discharge Instructions: May shower and wash wound with dial antibacterial soap and water prior to dressing change. Peri-Wound Care: Sween Lotion (Moisturizing lotion) 1 x Per Week/7 Days Discharge Instructions: Apply moisturizing lotion as directed Prim Dressing: KerraCel Ag Gelling Fiber Dressing, 4x5 in (silver  alginate) 1 x Per Week/7 Days ary Discharge Instructions: Apply silver alginate to wound bed as instructed Secondary Dressing: Woven Gauze Sponge, Non-Sterile 4x4 in 1 x Per Week/7 Days Discharge Instructions: Apply over primary dressing as directed. Secondary Dressing: ABD Pad, 8x10 1 x Per Week/7 Days Discharge Instructions: Apply over primary dressing as directed. Compression Wrap: ThreePress (3 layer compression wrap) 1 x Per Week/7 Days Discharge Instructions: Apply three layer compression as directed. Wound #25 - Foot Wound Laterality: Left, Lateral Cleanser: Soap and Water 1 x Per Week/7 Days Discharge Instructions: May shower and wash wound with dial antibacterial soap and water prior to dressing change. Peri-Wound Care: Sween Lotion (Moisturizing lotion) 1 x Per Week/7 Days Discharge Instructions: Apply moisturizing lotion as directed Prim Dressing: Cutimed Sorbact Swab 1 x Per Week/7 Days ary Discharge Instructions: Apply to wound bed as instructed Secondary Dressing: Woven Gauze Sponge, Non-Sterile 4x4 in 1 x Per Week/7 Days Discharge Instructions: Apply over primary dressing as directed. Secondary Dressing: ABD Pad, 8x10 1 x Per Week/7 Days Discharge Instructions: Apply over primary dressing as directed. Compression Wrap: ThreePress (3 layer compression wrap) 1 x Per Week/7 Days Discharge Instructions: Apply three layer compression as directed. Wound #26 - Lower Leg Wound Laterality: Right, Lateral Cleanser: Soap and Water 1 x Per Week/7 Days Discharge Instructions: May shower and wash wound with dial antibacterial soap and water prior to dressing change. Peri-Wound Care: Sween Lotion (Moisturizing lotion) 1 x Per Week/7 Days Discharge Instructions: Apply moisturizing lotion as directed Prim Dressing: KerraCel Ag Gelling Fiber Dressing, 4x5 in (silver alginate) 1 x Per Week/7 Days ary Discharge Instructions: Apply silver alginate to wound bed as instructed Secondary  Dressing: Woven Gauze Sponge, Non-Sterile 4x4 in 1 x Per Week/7 Days Discharge Instructions: Apply over primary dressing as directed. Secondary Dressing: ABD Pad, 8x10  1 x Per Week/7 Days Discharge Instructions: Apply over primary dressing as directed. Compression Wrap: ThreePress (3 layer compression wrap) 1 x Per Week/7 Days Discharge Instructions: Apply three layer compression as directed. Wound #27 - Lower Leg Wound Laterality: Right, Anterior, Distal Cleanser: Soap and Water 1 x Per Week/7 Days Discharge Instructions: May shower and wash wound with dial antibacterial soap and water prior to dressing change. Peri-Wound Care: Sween Lotion (Moisturizing lotion) 1 x Per Week/7 Days Discharge Instructions: Apply moisturizing lotion as directed Prim Dressing: KerraCel Ag Gelling Fiber Dressing, 4x5 in (silver alginate) 1 x Per Week/7 Days ary Discharge Instructions: Apply silver alginate to wound bed as instructed Secondary Dressing: Woven Gauze Sponge, Non-Sterile 4x4 in 1 x Per Week/7 Days Discharge Instructions: Apply over primary dressing as directed. Secondary Dressing: ABD Pad, 8x10 1 x Per Week/7 Days Discharge Instructions: Apply over primary dressing as directed. Compression Wrap: ThreePress (3 layer compression wrap) 1 x Per Week/7 Days Discharge Instructions: Apply three layer compression as directed. Wound #29 - Lower Leg Wound Laterality: Right, Lateral, Proximal Cleanser: Soap and Water 1 x Per Week/7 Days Discharge Instructions: May shower and wash wound with dial antibacterial soap and water prior to dressing change. Peri-Wound Care: Sween Lotion (Moisturizing lotion) 1 x Per Week/7 Days Discharge Instructions: Apply moisturizing lotion as directed Prim Dressing: KerraCel Ag Gelling Fiber Dressing, 4x5 in (silver alginate) 1 x Per Week/7 Days ary Discharge Instructions: Apply silver alginate to wound bed as instructed Secondary Dressing: Woven Gauze Sponge, Non-Sterile 4x4  in 1 x Per Week/7 Days Discharge Instructions: Apply over primary dressing as directed. Secondary Dressing: ABD Pad, 8x10 1 x Per Week/7 Days Discharge Instructions: Apply over primary dressing as directed. Compression Wrap: ThreePress (3 layer compression wrap) 1 x Per Week/7 Days Discharge Instructions: Apply three layer compression as directed. Wound #32 - T Great oe Wound Laterality: Right Prim Dressing: KerraCel Ag Gelling Fiber Dressing, 4x5 in (silver alginate) Every Other Day/30 Days ary Discharge Instructions: Apply silver alginate to wound bed as instructed Secondary Dressing: Woven Gauze Sponges 2x2 in Every Other Day/30 Days Discharge Instructions: Apply over primary dressing as directed. Secured With: Child psychotherapist, Sterile 2x75 (in/in) Every Other Day/30 Days Discharge Instructions: Secure with stretch gauze as directed. Secured With: Paper Tape, 1x10 (in/yd) Every Other Day/30 Days Discharge Instructions: Secure dressing with tape as directed. Wound #5 - Lower Leg Wound Laterality: Right, Medial Cleanser: Soap and Water 1 x Per Week/7 Days Discharge Instructions: May shower and wash wound with dial antibacterial soap and water prior to dressing change. Peri-Wound Care: Sween Lotion (Moisturizing lotion) 1 x Per Week/7 Days Discharge Instructions: Apply moisturizing lotion as directed Prim Dressing: KerraCel Ag Gelling Fiber Dressing, 4x5 in (silver alginate) 1 x Per Week/7 Days ary Discharge Instructions: Apply silver alginate to wound bed as instructed Secondary Dressing: Woven Gauze Sponge, Non-Sterile 4x4 in 1 x Per Week/7 Days Discharge Instructions: Apply over primary dressing as directed. Secondary Dressing: ABD Pad, 8x10 1 x Per Week/7 Days Discharge Instructions: Apply over primary dressing as directed. Compression Wrap: ThreePress (3 layer compression wrap) 1 x Per Week/7 Days Discharge Instructions: Apply three layer compression as  directed. 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 [place], [place] [address] [place], [place] [address] [phone] Allergies Sulfa (Sulfonamide Antibiotics); Levaquin; penicillin Provider's Orders Venous Duplex Doppler, right upper and lower leg - ICD10: P92.924 - **URGENT** - Follow up from DVT study on 11/07/20. Hand Signature: Date(s): Electronic Signature(s) Signed: 01/12/2021 5:01:02 PM By: Linton Ham MD Signed: 01/12/2021 5:28:50 PM By: Levan Hurst RN, BSN Entered By: Levan Hurst on 01/12/2021 12:38:49 -------------------------------------------------------------------------------- Problem List Details Patient Name: Date of Service: [name] [name], [name] 01/12/2021 11:30 A M Medical Record Number: [id number] Patient Account Number: [id number] Date of Birth/Sex: Treating RN: 01-12-1951 (70 y.o. 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 02/24/2015 No Yes I87.331 Chronic venous hypertension (idiopathic) with ulcer and inflammation of right 10/04/2016 No Yes lower extremity L89.616 Pressure-induced deep tissue damage of right heel 12/14/2019 No Yes L97.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 05/31/2020 No Yes L97.528 Non-pressure chronic ulcer of other part of left foot with other specified 12/05/2020 No Yes severity I70.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 10/31/2020 No Yes lower leg I70.248 Atherosclerosis of native arteries of left leg with ulceration of other part of 10/31/2020 No Yes lower leg Inactive Problems ICD-10 Code Description Active Date Inactive Date 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 01/12/2021 11:30 A M Medical Record Number: [id number] Patient Account Number:  [id number] Date of Birth/Sex: Treating RN: [birth year] (70 y.o. [name] [name] Primary Care Provider: [name] Other Clinician: Referring Provider: Treating Provider/Extender: [name] Kelp in Treatment: 459 Subjective History of Present Illness (HPI) this is a patient who initially came to [place] for wounds on the medial malleoli bilaterally as well as her upper medial lower extremities bilaterally. These wounds eventually healed with assistance of Apligraf's bilaterally. While this was occurring she developed the current wound which opened into a fairly substantial wound on the right lower extremity. These are mostly secondary to venous stasis physiology however the patient also has underlying scleroderma, pulmonary hypertension. The wound has been making good progress lately  with the Hydrofera Blue-based dressing. 03/17/2015; patient had a arterial evaluation a year ago. Her right ABI was 0.86 left was 1.0. T brachial index was 0.41 on the right 0.45 on the left. Her oe bilateral common femoral artery waveforms were triphasic. Her white popliteal posterior tibial artery and anterior tibial artery waveforms were monophasic with good amplitude. Luteal artery waveforms were biphasic it was felt that her bilateral great toe pressures are of normal although adequate for tissue healing. 03/24/2015. The condition of this wound is not really improved that. He was covered with as fibrinous surface slough and eschar. This underwent an aggressive debridement with both a curette and scalpel. I still cannot really get down to what I can would consider to be a viable surface. There is no evidence of infection. Previous workup for ischemia roughly a year ago was negative nevertheless I think that continues to be a concern 04/07/15. The patient arrived for application of second Apligraf. Once again the surface of this wound is certainly less viable than I would like for an advanced treatment  option. An aggressive debridement was done. She developed some arteriolar bleeding which required that along pressure and silver alginate. 04/21/15: change in the condition of this wound. Once again it is covered in a gelatinous surface slough. After debridement today surface of the wound looks somewhat better but now a heeling surface 05/05/15 Apligraf #4 applied.Still a lot of slough on this wound. 05/19/15 Apligraf #5 applied. Still a lot of slough on this wound I did not aggressively remove this 06/02/15; continued copious amounts of surface slough. This debridement fairly easily. 06/08/2015 -- the last time she had a venous duplex study done was over 3 years ago and the surgery was prior to that. I have recommended that she sees Dr. early for a another opinion regarding a repeat venous duplex and possibly more endovenous ablation of vein stripping of micro-phlebotomies. 06/16/15; wound has a gelatinous surface eschar that the debridement fairly easily to a point. I don't disagree with the venous workup and perhaps even arterial re-evaluation. She is on prednisone 5 mg and continue his medications for her pulmonary artery hypertension I am not sure if the latter have any wound care healing issues I would need to investigate this. 06/23/15 continues with a gelatinous surface eschar with of fibrinous underlying. What I can see of this wound does not look unhealthy however I just can't get this material which I think is 2 different layers off. Empiric culture done 06/30/15; unfortunately not a lot of change in this wound. A gelatinous surface eschar is easily removed however it has a tight fibrinous surface underneath the. 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 The wound comes back and basically and unchanged state the. She has a gelatinous surface that is more easily removed however there is a tightly fibrinous surface underneath the. There is no evidence of infection. She has a vascular  follow-up next month. I would have to inject her in order to do a more aggressive debridement of this area 07/21/15 the wound is roughly in the same state albeit the debridement is done with greater ease. There is less of the fibrinous eschar underneath the. There is no evidence of infection. She has follow-up with vascular surgery next week. No evidence of surrounding infection. Her original distal wounds healed while this one formed. 07/28/15; wound is easier to debride. No wound erythema. She is seeing Dr. [name] next week. 08/18/15 Has been to  [facility] for repeat ablation. Have been using medihoney pad with some improvement 08/25/15; absolutely no change in the condition of this wound in either its overall size or surface condition in many months now. At one point I had this down to [place] healthier surface I think with [facility] however this did not actually progress towards closure. Do not believe that the wound has actually deteriorated in terms of volume at all. We have been using a medihoney pad which allows easier debridement of the gelatinous eschar but again no overall actual improvement. the patient is going towards an ablation with pain and vascular which I think is scheduled for next week. The only other investigation that I could first see would be a biopsy. She does have underlying scleroderma 09/02/15 eschar is much easier to debridement however the base of this does not look particularly vibrant. We changed to Iodoflex. The patient had her ablation earlier this week 09/09/15 again the debridement over the base of this wound is easier and the base of the wound looks considerably better. We will continue the Iodoflex. Dr. Tawni Millers has expressed his satisfaction with the result of her ablation 09/15/15 once again the wound is relatively free of surface eschar. No debridement was done today. It has a pale-looking base to it. although this is not as deep as it once was it seems to be  expanding especially inferiorly. She has had recent venous ablation but this is no closer to healing.I've gone ahead and done a punch biopsy this from the inferior part of the wound close to normal skin 09/22/15: the wound is relatively free of surface eschar. There is some surrounding eschar. I'm not exactly sure at what level the surface is that I am seeing. 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 No evidence of atypical infection, malignancy. 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 No debridement was done 09/29/15; the wound surface is relatively free of surface eschar. There is eschar surrounding the walls of the wound. Aside from the improvement in the amount of surface slough. This wound has not progressed any towards closure. There is not even a surface that looks like there at this is ready. There is no evidence of any infection nor maligancy based on biopsy I did on 9/15. I continued with the Iodoflex however I am looking towards some alternative to try to promote some closure or filling in of this surface. Consider triple layer [place]. Collagen did not result in adequate control of the surface slough 10/13/15; the patient was in hospital last week with severe anemia. The wound looks somewhat better after debridement although there is widening medially. There is no evidence of infection. 10/20/15; patient's wound on the right lateral lower leg is essentially unchanged. This underwent a light surface debridement and in general the debridement is easier and the surface looks improved. I noted in doing this on the side of the wound what appears to be a piece of calcium deposition. The patient noted that she had previous things on the tips of her fingers. 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 She follows with rheumatology and I have asked them to talk to her about  this. In view of that S the nonhealing ulcer may have something to do with calcinosis and also unrelieved Raynaud's disease in this area. I should note that her original wounds on the right and left medial malleolus and  the inner aspects of both legs just below the knees did however heal over 10/30/15; the patient's wound on the right lateral lower leg is essentially unchanged. I was able to remove some calcified material from the medial wound edge. 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 Otherwise the wound appears essentially unchanged there is less adherent surface eschar. Some of the calcified material was sent to pathology for analysis 11/17/15. The patient's wound on the right lateral leg is essentially unchanged. Wider Medially. 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 There is some surface slough but I did not debridement this. No evidence of surrounding infection. 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 This will make healing difficult 12/15/15; the wound has less depth surface has a fibrinous slough and calcifications in the wound edge no evidence of infection 12/22/15; the wound definitely has less Fibrinous slough on the base. Calcifications around the wound edges are still evident. Although the wound bed looks healthier it is still pale in appearance. Previous biopsy did not show malignancy 01/04/15; surgical debridement of nonviable slough and subcutaneous tissue the wound cleans up quite nicely but appears to be expanding outward calcifications around the wound edges are still there. Previous biopsy did not show malignancy fungus or vasculitis but  a panniculitis. She is to see her rheumatologist I'll see if he has any opinions on this. My punch biopsy done in September did not show calcifications although these are clearly evident. 01/19/16 light selective debridement of nonviable surface slough. There is epithelialization medially. This gives me reason for cautious optimism. She has been to see her rheumatologist, there is nothing that can be done for this type of soft tissue calcification associated with scleroderma 02/02/16 no debridement although there is a light surface slough. She has 2 peninsulas of skin 1 inferiorly and one medially. We continue to make a slight and slow but definite progressive here 02/16/16; light surface debridement with more attention to the circumference of the wound bed where the fibrinous eschar is more prevalent. No calcifications detected. She seems to have done nicely with the [facility] with some epithelialization and some improvement in the overall wound volume. 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 Continued attention to the circumference of the wound where the fibrinous eschar in calcinosis or prevalent. No calcifications were detected. She has continued improvement with Hydrofera Blue. The wound is no longer as deep 03/15/16 surgical debridement done to remove surface escha Especially around the circumference of the wound where there is nonviable subcutaneous tissue. In spite of this there is considerable improvement in the overall dimensions and depth of the wound. Islands of epithelialization are seen especially medially inferiorly and superiorly to a lesser extent. She is using Hydrofera Blue at home 03/29/16; surgical debridement done to remove surface eschar and nonviable subcutaneous tissue. This cleans up quite nicely mention slightly larger in terms of length and  width however depth is less 04/12/16; continued gradual improvement in terms of depth and the condition of the wound base. Debridement is done. Continuing long standing Hydrofera Blue at home with Kerlix Coban wraps 04/26/16;  continued gradual improvement in terms of depth and management as well as condition of the wound base. Surgical debridement done she continues with Hydrofera Blue. 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 She initially came to this clinic venous insufficiency ulcers which have long since healed 05/17/16 continued improvement in terms of the depth and measurements of this wound although she has a tightly adherent fibrinous slough each time. We've been continued with long standing Hydrofera Blue which seems to done as well for this wound is many advanced treatment options. 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 She also has chronic venous insufficiency. She has an irritation on her lateral right ankle secondary to our wraps 05/10/16; wound appears to be smaller especially on the medial aspect and especially in the width. Wound was debridement surface looks better. She is also been in the hospital apparently with anemia again she tells me she had an endoscopy. Since she got home after 3 days which I believe was sometime last week she has had an irritated painful area on the right lateral ankle surrounding the lateral malleolus 05/31/16; much more adherent surface slough today then recently although I don't think the dimensions of changed that much. A more aggressive debridement is required. The irritated area over her medial malleolus is more pruritic and painful and I don't think represents cellulitis 06/14/16; no major change in her wound dimensions however there is more tightly adherent surface slough which is disappointing. As well as she appears to have a new small area medially. Furthermore an  irritated uncomfortable area on the lateral aspect of her right foot just below the lateral malleolus. 06/21/16; I'm seeing the patient back in follow-up for the new areas under her major wound on the right anterior leg. She has been using Hydrofera Blue to this area probably for several months now and although the dressings seem to be helping for quite a period of time I think things have stagnated lately. She comes in today with a relatively tight adherent surface slough and really no changes in the wound shape or dimension. The 2 small areas she had inferiorly are tiny but still open they seem improved this well. There is no uncontrolled edema and I don't think there is any evidence of cellulitis. 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 Patient has had vascular evaluations both venous and arterial. I have biopsied this area. There is no obvious infection. The worrisome thing today is that she seems to be developing areas of erythema and epithelial damage on the medial aspect of the right foot. Also to a lesser degree inferior to the actual wound itself. Again I see no obvious changes to suggest cellulitis however as this is the only treatable option I will probably give her antibiotics. 07/13/16 no major change in the lady's large anterior right leg wound. Still covered with a very tightly adherent surface slough which is difficult to debridement. There is less erythema around this, culture last week grew pseudomonas I gave her ciprofloxacin. The area on her lateral right malleolus looks better- 08/02/16 the patient's wounds continued to decline. Her original large anterior right leg wound looks deeper. Still adherent surface slough that is difficult to debridement. She has a small area just below this and a punched-out wound just below her lateral malleolus. In the meantime she is  been in  hospital with apparently an upper GI bleed on Plavix and aspirin. She is now just on Plavix she received 3 units of packed cells 08/23/16; since I last saw this 3 weeks ago, the open large area on her right leg looks about the same syrup. She has a small satellite lesion just underneath this. The area on her medial right ankle is now a deep necrotic wound. I attempted to debridement this however there is just too much pain. 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 She follows with vascular surgery and is scheduled for an angiogram in early September 09/06/16; the patient is going for an arteriogram tomorrow. Her original large wound on the right calf is about the same the satellite lesion underneath it is about the same however the area on her medial ankle is now deeper with exposed tendon. I am no longer attempting to debride these wounds 09/20/16; the patient has undergone a right femoral endarterectomy and Dacron patch angioplasty. This seems to have helped the flow in her right leg. 10/04/16; Arrived today for aggressive debridement of the wounds on her right calf the original wound the one beneath it and a difficult area over her right lateral leg just above the lateral malleolus. 10/25/16; her 3 open wounds are about the same in terms of dimensions however the surface appears a lot healthier post debridement. Using Iodoflex 10/18/16 we have been using Iodoflex to her wounds which she tolerated with some difficulty. 10/11/16; has been using Santyl for a period of time with some improvement although again very adherent surface slough would prevent any attempted healing this. She has a original wound on the left calf, the satellite underneath that and the most recent wound on the right medial ankle. She has completed revascularization by Dr. early and has had venous ablation earlier. Want to go back to Iodoflex to see if week and  get a healthier surface to this wound bed failing this I think she'll need to be taken to the OR and I am prepared to call Dr. Marla Roe to discuss this. She is obviously not a good candidate for general anesthesia however.; 11/08/16; I put her on Iodoflex last time to see if I can get the wound bed any healthier and unfortunately today still had tremendous surface slough. 11/15/16; 4 weeks' worth of Iodoflex with not much improvement. Debridement on the major wounds on her left anterior leg is easier however this does not maintain from week to week. The punched-out area on her medial right ankle 11/29/16; I attempted to change the patient last visit to [facility] however she states this burned and was very uncomfortable therefore we gave her permission to go back to the eye out of complex which she already had at home. Also she noted a lot of pain and swelling on the lateral aspect of her leg before she traveled to [facility] for the holidays, I called her in doxycycline over the phone. This seems to have helped 12/06/16; Wounds unchanged by in large. Using Iodoflex 12/13/16; her wounds today actually looks somewhat better. The area on the right lateral lower leg has reasonably healthy-looking granulation and perhaps as actually filled and a bit. Debridement of the 2 wounds on the medial calf is easier and post debridement appears to have a healthier base. We have referred her to Dr. Migdalia Dk for consideration of operative debridement 12/20/16; we have a quick  note from Dr. Merri Ray who feels that the patient needs to be referred to an academic center/plastic surgeon. This is due to the complexity of the patient's medical issues as it applies to anything in the OR. We have been using Iodoflex 12/27/16; in general the wounds on the right leg are better in terms of the difficult to remove surface slough. She has been using Iodoflex. She is approved for Apligraf which I  anticipated ordering in the next week or 2 when we get a better-looking surface 01/04/16 the deep wounds on the right leg generally look better. Both of them are debrided further surface slough. The area on the lateral right leg was not debrided. She is approved for Apligraf I think I'll probably order this either next week or the week after depending on the surface of the wounds superiorly. We have been using Iodoflex which will continue until then 01/11/16; the deep wounds on the right leg again have a surface slough that requires debridement. I've not been able to get the wound bed on either one of these wounds down to what would be acceptable for an advanced skin stab-like Apligraf. The area on the medial leg has been improving. We have been using hot Iodoflex to all wounds which seems to do the best at at least limiting the nonviable surface 01/24/17; we have continued Iodoflex and all her wound areas. Her debridement Gen. he is easier and the surface underneath this looks viable. Nevertheless these are large area wounds with exposed muscle at least on the anterior parts. We have ordered Apligraf's for 2 weeks from now. The patient will be away next week 02/07/17; the patient was close to have first Apligraf today however we did not order one. I therefore replaced her Iodoflex. She essentially has 3 large punched- out areas on her right anterior leg and right medial ankle. 02/11/17; Apligraf #1 02/25/17; Apligraf #2. In general some improvement in the right medial ankle and right anterior leg wounds. The larger wound medially perhaps some better 03/11/17; Apligraf #3. In general continued improvement in the right medial ankle and the right anterior leg wounds. 03/25/17 Apligraf #4. In general continued improvement especially on the right medial ankle and the lower 04/08/17; Apligraf #5 in general continued improvement in all wound sites. 04/22/17; post Apligraf #5 her wound beds continued to look a lot  better all of them up to the surface of the surrounding skin. Had a caramel-colored slough that I did not debrided in case there is residual Apligraf effect. The wounds are as good as I have seen these looking quite some time. 04/29/17; we applied [employer] and last week after completing her Apligraf. Wounds look as though they've contracted somewhat although they have a nonviable surface which was problematic in the past. Apparently she has been approved for further Apligraf's. We applied Iodosorb today after debridement. 05/06/17; we're fortunate enough today to be able to apply additional Apligraf approved by her insurance. In general all of her wounds look better Apligraf #6 05/24/17; Apligraf #7 continued improvement in all wounds o3 06/10/17 Apligraf #8. Continued improvement in the surface of all wounds. Not much of an improvement in dimensions as I might a follow 06/24/17 Apligraf #9 continued general improvement although not as much change in the wound areas I might of like. She has a new open area on the right anterior lateral ankle very small and superficial. She also has a necrotic wound on the tip of her right index finger probably  secondary to severe uncontrolled Raynaud's phenomenon. She is already on sildenafil and already seen her rheumatologist who gave her Keflex. 07/08/17; Apligraf #10. Generalized improvement although she has a small additional wound just medial to the major wound area. 07/22/17; after discussion we decided not to reorder any further Apligraf's although there is been considerable improvement with these it hasn't been recently. The major wound anteriorly looks better. Smaller wounds beneath this and the more recent one and laterally look about the same. The area on the right lateral lower leg looks about the same 07/29/17 this is a patient who is exceedingly complex. She has advanced scleroderma, crest syndrome including calcinosis, PAD status post  revascularization, chronic venous insufficiency status post ablations. She initially presented to this clinic with wounds on her bilateral lower legs however these closed. More recently we have been dealing with a large open area superiorly on the right anterior leg, a smaller wound underneath this and then another one on the right medial lower extremity. These improve significantly with 10 Apligraf applications. Over the last 2-3 weeks we are making good progress with Hydrofera Blue and these seem to be making progress towards closure 08/19/17; wounds continued to make good improvement with Hydrofera Blue and episodic aggressive debridements 08/26/17; still using Hydrofera Blue. Good improvements 09/09/17; using Hydrofera Blue continued improvement. Area on the lateral part of her right leg has only a small remaining open area. The small inferior satellite region is for all intents and purposes closed. Her major wound also is come in in terms of depth and has advancing epithelialization. 09/16/17; using Hydrofera Blue with continued improvement. The smaller satellite wound. We've closed out today along with a new were satellite wound medially. The area on her medial ankle is still open and her major wound is still open but making improvement. All using Hydrofera Blue. Currently 09/30/17; using Hydrofera Blue. Still a small open area on the lateral right ankle area and her original major wound seems to be making gradual and steady improvement. 10/14/17; still using Hydrofera Blue. Still too small open areas on the right lateral ankle. Her original major wound is horizontal and linear. The most problematic area paradoxically seems to be the area to the medial area wears I thought it would be the lateral. 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 10/28/17; still using Hydrofera Blue. Right lateral ankle has a very small open area superiorly on the most  lateral part of the wound. Her original open wound has 2 open areas now separated by normal skin and we've redefined this. 11/11/17; still using Hydrofera Blue area and right lateral ankle continues to have a small open area on mostly the lateral part of the wound. Her original wound has 2 small open areas now separated by a considerable amount of normal skin 11/28/17; the patient called in slightly before Thanksgiving to report pain and erythema above the wound on the right leg. In the past this is responded well to treatment for cellulitis and I gave her over the phone doxycycline. She stated this resulted in fairly abrupt improvement. We have been using Hydrofera Blue for a prolonged period of time to the larger wound anteriorly into the remaining wound on her right right lateral ankle. The latter is just about closed with only a small linear area and the bottom of the [place]. 12/02/17; use endoform and left the dressing on since last visit. There is no tenderness and no evidence of infection.  12/16/17; patient has been using Endoform but not making much progress. The 2 punched out open areas anteriorly which were the reminiscence of her major wound appear deeper. The area on the lateral aspect of her right calf also appears deeper. Also she has a puzzling tender swelling above her wound on the right leg. This seems larger than last time. Just above her wounds there appears to be some fluctuance in this area it is not erythematous and there is no crepitus 12/30/17; patient has been using Endoform up until last week we used Hydrofera Blue. Ultrasound of the swelling above her 2 major wounds last time was negative for a fluid collection. I gave her cefaclor for the erythema and tenderness in this area which seems better. Unfortunately both punched out areas anteriorly and the area on her right medial lower leg appear deeper. In fact the lateral of the wounds anteriorly actually looks as though it has  exposed tendon and/or muscle sheath. She is not systemically unwell. She is complaining of vaginitis type symptoms presumably Candida from her antibiotics. 01/06/18; we're using santyl. she has 2 punched out areas anteriorly which were initially part of a large wound. Unfortunately medially this is now open to tendon/muscle. All the wounds have the same adherent very difficult to debride surface. 01/20/18; 2 week follow-up using Santyl. She has the 2 punched out areas anteriorly which were initially part of her large surface wound there. Medially this still has exposed muscle. All of these have the same tightly adherent necrotic surface which requires debridement. PuraPly was not accepted by the patient's insurance however her insurance I think it changed therefore we are going to run Apligraf to gain 02/03/18; the patient has been using Santyl. The wound on the right lateral ankle looks improved and the 2 areas anteriorly on the right leg looks about the same. The medial one has exposed muscle. The lateral 1 requiresdebridement. We use PuraPly today for the first week 02/10/18; PuraPly #2. The patient has 3 wounds. The area on the right lateral ankle, 2 areas anteriorly that were part of her original large wound in this area the medial one has exposed muscle. All of the wounds were lightly debrided with a number 3 curet. PuraPly #2 applied the lateral wound on the calf and the right lateral ankle look better. 02/17/18; PuraPly #3. Patient has 3 wounds. The area on the right lateral ankle in 2 areas internally that were part of her original large wound. The lateral area has exposed muscle. She arrived with some complaints of pain around the right ankle. 02/24/18; PuraPly #4; not much change in any of the 3 wound areas. Right lateral ankle, right lateral calf. Both of these required debridement with a #3 curet. She tolerates this marginally. The area on the medial leg still has exposed muscle. Not much change  in dimensions 03/03/18 PuraPly #5. The area on the medial ankle actually looks better however the 2 separate areas that were original parts of the larger right anterior leg wound look as though they're attempting to coalesce. 03/10/18; PuraPly #6. The area on the medial ankle actually continues to look and measures smaller however the 2 separate areas that were part of the original large wound on the right anterior leg have now coalesced. There hasn't been much improvement here. The lateral area actually has underlying exposed muscle 03/17/18-she is here in follow-up evaluation for ulcerations to the right lower extremity. She is voicing no complaints or concerns. She tolerated debridement. Puraply#7  placed 03/24/18; difficult right lower extremity ulcerations. PuraPly #8 place. She is been approved for [employer]. She did very well with Apligraf today however she is apparently reached her "lifetime max" 03/31/18; marginal improvement with PuraPly although her wounds looked as good as they have in several weeks today. Used TheraSkins #1 04/14/18 TheraSkin #2 today 04/28/18 TheraSkins #3. Wound slightly improved 05/12/18; TheraSkin skin #4. Wound response has been variable. 05/27/18 TheraSkin #5. Generally improvement in all wound areas. I've also put her in 3 layer compression to help with the severe venous hypertension 06/09/18; patient is done quite well with the TheraSkins unfortunately we have no further applications. I also put her in 3 layer compression last week and that really seems to of helped. 06/16/18; we have been using silver collagen. Wounds are smaller. Still be open area to the muscle layer of her calf however even that is contracted somewhat. She tells me that at night sometimes she has pain on the right lateral calf at the site of her lower wound. Notable that I put her into 3 layer compression about 3 weeks ago. She states that she dangles her leg over the bed that makes it feel better but  she does not describe claudication during the day ooShe is going to call her secondary insurance to see if they will continue to cover advanced treatment products I have reviewed her arterial studies from 01/22/17; this showed an ABI in the right of 1 and on the left noncompressible. TBI on the right at 0.30 on the left at 0.34. It is therefore possible she has significant PAD with medial calcification falsely elevating her ABI into the normal range. I'll need to be careful about asking her about this next week it's possible the 3 layer compression is too much 06/23/18; was able to reapply TheraSkin 1 today. Edema control is good and she is not complaining of pain no claudication 07/07/18;no major change. New wound which was apparently a taper removal injury today in our clinic between her 2 wounds on the right calf TheraSkins #2 07/14/18; I think there is some improvement in the right lateral ankle and the medial part of her wound. There is still exposed muscle medially. 07/28/18; two-week follow-up. TheraSkins#3. Unfortunately no major change. She is not a candidate I don't think for skin grafting due to severe venous hypertension associated with her scleroderma and pulmonary hypertension 08/11/18 Patient is here today for her [employer] application #89 (#5 of the second set). She seems to be doing well and in the base of the wound appears to show some progress at this point. This is the last approved [employer]in of the second set. 08/25/18; she has completed TheraSkin. There has been some improvement on the right lateral calf wound as well as the anterior leg wounds. The open area to muscle medially on the anterior leg wound is smaller. I'm going to transition her back to [facility] under Kerlix Coban change every second day. She reports that she had some calcification removed from her right upper arm. We have had previous problems with calcifications in her wounds on her legs but that has not happened  recently 09/08/18;using Hydrofera Blue on both her wound areas. Wounds seem to of contracted nicely. She uses Kerlix Coban wrap and changes at home herself 09/22/2018; using Hydrofera Blue on both her wound areas. Dimensions seem to have come down somewhat. There is certainly less depth in the medial part of the mid tibia wound and I do not think there  is any exposed muscle at this point. 10/06/2018; 2-week follow-up. Using [facility] on her wound areas. Dimensions have come down nicely both on the right lateral ankle area in the right mid tibial area. She has no new complaints 10/20/2018; 2-week follow-up. She is using Hydrofera Blue. Not much change from the last time she was here. The area on the lateral ankle has less depth although it has raised edges on one side. I attempted to remove as much of the raised edge as I could without creating more additional wounds. The area on the right anterior mid tibia area looks the same. 11/03/2018; 2-week follow-up she is using Hydrofera Blue. On the right anterior leg she now has 2 wounds separated by a large area of normal skin. The area on the medial part still has I think exposed muscle although this area itself is a lot smaller. The area laterally has some depth. Both areas with necrotic debris. ooThe area on her right lateral ankle has come in nicely 11/17/2018; patient continues to use Hydrofera Blue. We have been increasing separation of the 2 wounds anteriorly which were at one point joint the area on the right lateral calf continues to have I think some improvement in depth. 12/08/2018; patient continues to use Hydrofera Blue. There is some improvement in the area on the right lateral calf. The 2 areas that were initially part of the original large wound in the mid right tibia are probably about the same. In fact the medial area is probably somewhat larger. We will run puraply through the patient's insurance 12/22/2018; she has been using Hydrofera  Blue. We have small wounds on the right lateral calf and 2 small areas that were initially part of a large wound in the right mid tibia. We applied pure apply #1 today. 12/29/2018; we applied puraply #2. Her wounds look somewhat better especially on the right lateral calf and the lateral part of her original wound in the mid tibial area. 01/05/2019; perhaps slightly improved in terms of wound bed condition but certainly not as much improvement as I might of liked. Puraply #3 1/13: we did not have a correct sized puraply to apply. wounds more pinched out looking, I increased her compression to 3 layer last week to help with significant multilevel venous hypertension. Since then I've reviewed her arterial status. She has a right femoral endarterectomy and a distal left SFA stent. She was being followed by Dr. [name] for a period however she does not appear to have seen him in 3 years. I will set up an appointment. 1/20. The patient has an additional wound on the right lateral calf between the distal wound and proximal wounds. We did not have Puraply last week. Still does not have a follow-up with Dr. early 1/27: Follow-up with Dr. early has been arranged apparently with follow-up noninvasive studies. Wounds are measuring roughly the same although they certainly look smaller 2/3; the patient had non invasive studies. Her ABIs on the right were 0.83 and on the left 1.02 however there was no great toe pressure bilaterally. Also worrisome monophasic waveforms at the PTA and dorsalis pedis. We are still using Puraply. We have had some improvement in all of the wounds especially the lateral part of the mid tibial area. 2/10; sees Dr. early of vein and vascular re-arterial studies next week. Puraply reapplied today. 2/17; Dr. early of vein and vascular his appointment is tomorrow. Puraply reapplied after debridement of all wounds 2/24; the patient saw Dr. early  I reviewed his note. sHe noted the previous right  femoral endarterectomy with a Dacron patch. He also noted the normal ABI and the monophasic waveforms suggesting tibial disease. Overall he did not feel that she had any evidence of arterial insufficiency that would impair her wound healing. He did note her venous disease as well. He suggested PRN follow-up. 3/2; I had the last puraply applied today. The original wounds over the mid tibia area are improved where is the area on the right lower leg is not 3/9; wounds are smaller especially in the right mid tibia perhaps slightly in the right lateral calf. We finished with puraply and went to endoform today 3/23; the patient arrives after 2 weeks. She has been using endoform. I think all of her wounds look slightly better which includes the area on the right lateral calf just above the right lateral malleolus and the 2 in the right mid tibia which were initially part of the same wound. 4/27; TELEHEALTH visit; the patient was seen for telehealth visit today with her consent in the middle of the worldwide epidemic. Since she was last here she called in for antibiotics with pain and tenderness around the area on the right medial ankle. I gave her empiric doxycycline. She states this feels better. She is using endoform on both of these areas 5/11 TELEHEALTH; the patient was seen for telehealth visit today. She was accompanied at home by her husband. She has severe pulmonary hypertension accompanied scleroderma and in the face of the Covid epidemic cannot be safely brought into our clinic. We have been using endoform on her wound areas. There are essentially 3 wound areas now in the left mid tibia now 2 open areas that it one point were connected and one on the right lateral ankle just above the malleolus. 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 She cannot come to clinic. The wounds were reviewed today via telehealth. She has severe chronic venous hypertension which I think is centrally mediated. She has wounds on her right anterior tibia and right lateral ankle area. These are chronic. She has been using endoform. 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 We have been following her from telehealth. She has severe chronic venous hypertension which may be mostly centrally mediated secondary to right heart heart failure. She has wounds on her right anterior tibia and right lateral ankle these are chronic we have been using endoform 6/22; TELEHEALTH; this patient was seen today via telehealth. 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 Since the last time we had contact with this patient she developed some pain and erythema around the wound on her right lateral malleolus/ankle and we put in antibiotics for her. This is resulted in good improvement with resolution of the erythema and tenderness. I changed her to silver alginate last time. We had been using endoform for an extended period of time 7/13; TELEHEALTH; this patient was seen today via telehealth. 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 She would continue to be at a prohibitive risk to be brought into our clinic unless this was absolutely necessary.  These visits have been done with her approval as well as her husband. We have been using silver alginate to the areas on the right mid tibia and right lateral lower leg. 7/27 TELEHEALTH; patient was seen along with her husband today via telehealth. 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 We changed her to sample last visit. She has 2 areas a chronic wound on her right mid tibia and one just above her ankle. These were not the original wounds when she came into this clinic but she developed them during treatment 8/17; she comes in for her first face-to-face visit in a long period. She has a remaining area just medial to the right tibia which is the last open part of her large wound across this area. She also has an area on the right lateral lower leg. We prescribed Santyl last telehealth visit but they were concerned that this was making a deeper so they put silver alginate on it last week. Her husband changes the dressings. 8/31; using Santyl to the 2 wound areas some improvement in wound surfaces. Husband has surgery in 2 weeks we will put her out 3 weeks. Any of the advanced treatment options that I can think of that would be eligible for this wound would also cause her to have to come in weekly. The risk that the patient is just too high 9/21. Using Santyl to the 2 wound areas. Both of these are somewhat better although the medial mid tibia area still has exposed muscle. Lateral ankle requiring debridement. Using Santyl 10/12; using Santyl to the 2 wound areas. One on the right lateral ankle and the other in the medial calf which still has exposed muscle. Both areas have come down in size and have better looking surfaces. She has made nice progress with santyl 11/2; we have been using Santyl to the 2 wound areas. Right lateral ankle and the other in the mid tibia area the medial part of this still has exposed muscle. 11/23/2019 on evaluation today patient appears to be doing about the same really with regard to her wounds. She is actually not very pleased with how things seem to be progressing at this point she tells me that she really has not noted much improvement unfortunately. With that being said there is no signs of active infection at this time. There is some  slough buildup noted at this time which again along with some dry skin around the edges of the wound I think would benefit her to try to debride some of this away. Fortunately her pain is doing fairly well. She still has exposed muscle in the right medial/tibial area. 12/14; TELEHEALTH; she was changed to [facility] to the right calf wound and right lateral ankle wound when she was here last time. Unfortunately since then she had a fall with a pelvic fracture and a fracture of her wrist. She was apparently hospitalized for 5 days. I have not looked at her discharge summary. She apparently came out of the hospital with a blister on her right heel. She was seen today via telehealth by myself and our case manager. The patient and her husband were present. She has been using Hydrofera Blue at our direction from the last time she was in the clinic. There is been no major improvement in fact the areas appear deeper and with a less viable surface 01/04/2020; TELEHEALTH; the patient was  seen today in accompaniment of her husband and our nurse. She has 3 open areas 1 on the right medial mid tibia, one on the right lateral ankle and a large eschared pressure area on the right heel. We have been using Iodoflex to the 2 original wounds. 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 It is simply too perilous for her to be seen in any other way 1/26; TELEHEALTH; the patient was seen today in accompaniment of her husband and our RN one of our nurses. She still has the 3 open areas 1 on the right medial mid tibia which is the remanent of a more extensive wound in this area, one on the right lateral ankle and a large eschared pressure area on the right heel. We have been using Iodoflex to the 2 original wounds and a bed at 9 application to the eschared area on the heel 2/15; the first time we have seen this patient and then several months out of concern for the pandemic. She had a large  horizontal wound in the mid tibia. Only the medial aspect of this is still open. Area on the lateral ankle is just about closed. She had a new pressure ulcer on the right heel which I have removed some of the eschar. We have been using Iodoflex which I will continue. The area in the mid tibia has a round circle in the middle of exposed muscle. I think we would have to use an advanced treatment product to stimulate granulation over this area. We will run this through her insurance. She is not eligible for plastic surgery for many reasons 3/1; TELEHEALTH. The patient was seen today by telehealth. 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 We have been using Iodoflex to the wound areas which include the right anterior mid tibia, right lateral ankle and the right heel. All of these were reviewed 3/8; the patient's wound just above the right ankle is closed. She still has a contracting black eschar on the heel where she had a pressure ulcer. The medial part of her original wound on the mid tibia has exposed muscle. We have made really made no progress in this area although we have managed to get a lot of the original wound in this area to close. We used Apligraf #1 today 3/22; the patient's ankle wound remains closed. She still has a contracting black eschar on the heel although it seems to have less surface area. It still not clear whether there is any depth here. We used Apligraf #2 today in an attempt to get granulation over the exposed muscle and what is left of her mid tibia area. 4/5; everything is closed except the medial aspect of the mid tibia wound, as well as the pressure injury on the right heel. She has been using Betadine to the right heel which has been gradually contracting. We used Apligraf #3 today. 4/19; Apligraf #4. Still a pressure area on the right heel she has been using Betadine  superficial excoriated areas she has been applying salicylic acid to on the anterior leg and the thick horn on the leg just above her wound area 5/3; Apligraf #5. Unfortunately she came in today with a reopening of the area on the right lateral lower leg. Not much change in the wound we have been treating in the mid calf. Quite a bit of edema surrounding this wound. She reports  that she was up on her feet quite a bit. The area on the right heel is separating she has been using Betadine She comes in with a new wound on the left lateral malleolus which is very disappointing this is been open since last week 5/17; we applied her last Apligraf last time. Unfortunately that did not have any effect on the deep area medially in the mid tibial area. However the area over the right lateral malleolus is a lot better. Right heel also is contracted we used Iodoflex last time. The new wound on the left lateral malleolus were using Santyl to. This required debridement. 6/1. Not much change in the area on the right medial mid tibia. Right lateral ankle is improved she has the necrotic area on the right heel which was a pressure area. New area on the left lateral malleolus from last time. I changed her to Iodoflex to the area on the left lateral malleolus she said this hurt I have put her back on Santyl 6/15; right mid tibia is unchanged. I do not think there is anything I can do to this topically that we will get this to granulate over the muscle. And I am furthermore I am not sure that she is a surgical candidate either because of her severe pulmonary issues or because she really does not want to go through it. ooThe area on the right lateral malleolus is a small punched out area. ooThe area on the left lateral malleolus again small punched out with nonviable surface ooPressure ulcer on the right heel at separating black eschar. I went ahead and remove this today Lura Em has an area on the left anterior mid  tibia just laterally. Geographic wounds debris on the surface. Some erythema here. 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 There is skin changes widely Change the primary dressing to Sorbact to all wound areas. She needs compression on the left leg as well as the right 6/21; right mid tibia which was her one remaining wound at 1 point is unchanged ooThe area on the right lateral malleolus actually is close to closing over still a small punched out area ooThe area on the left lateral malleolus again a deep wound it is this 1 that she feels pain in. ooPressure ulcer on the right heel was cultured today. ooNew area on the left anterior mid tibia several geographic wounds last week in the setting of chronic venous insufficiency this actually looks some better 7/6; ooRight mid tibia about the same. Exposed muscle ooLeft lateral malleolus again necrotic debris over the surface. ooPressure ulcer on the right heel. She finished the Keflex we prescribed. Necrotic debris debrided with a #3 curette ooThe rest of her wounds on the right mid tibia right lateral ankle are better Her husband reminds me that she had a right femoral endarterectomy and has 2 stents in her left thigh placed in 2011 approximately by vascular surgery in [place]. She saw Dr. [name] last in February 2020. At that point he did not think that the arterial insufficiency she had on the right was sufficient to explain any of her right lower extremity wounds however she now has an area on the left lateral malleolus that looks like an ischemic wound 7/12; we have been using Sorbact all wound areas ooRight mid tibia about the same exposed muscle ooLeft lateral malleolus small wound with debris in the surface punched out ooPressure ulcer of the right heel requiring debridement of necrotic debris ooLateral ankle wound  on the right is just about closed ooRight mid tibia wound about the same still with  exposed muscle. 7/26 really no change in any wounds. We have been using Sorbact. She has bilateral lower extremity wounds. This is in the setting of scleroderma, severe venous hypertension. She also has known PAD and I had really hoped to be able to get a review by Dr. [name] but apparently that is not booked until August 31. I have asked her to call back to vein and vascular and get an appointment with somebody a little earlier if that is possible. 8/16; patient's area on the right ankle which was a chronic wound is closed over again. The area on the right mid lower leg, right heel left lateral ankle are all about the same. Pressure ulcer on the right heel is still not viable. New areas identified on the right lateral calf. She has a follow-up with Dr. [name] on 8/31. Phenomenally I would like him to go over her arterial status with regards to her underlying stents. 9/13; patient had her ARTERIAL studies done however apparently Dr. [name] is now in [place] so she did not get an appointment with him on 8/31 and was not referred to any other provider. On the right side her PTA was monophasic. Noncompressible. TBI on the right at 0.41 on the left she was monophasic and noncompressible they did not do a TBI because she had a Band-Aid on her big toe. Patient does not describe claudication although she is having a lot of pain in the left lateral malleolus. 10/11; the patient had a right-sided interventions on 10/5. She had a angioplasty of the right anterior tibial artery as well as the tibioperoneal trunk/peroneal artery she had a drug-coated balloon angioplasty of the right superficial femoral artery. On the left she did not have any interventions yet but is going to have another look at this in 2 weeks. The superficial femoral artery on the left and the associated stent are patent popliteal artery is patent to the dominant vessel runoff is the anterior tibial which has several high-grade stenosis in the  proximal one third. We have been using Sorbact. She also has a new area on the right upper mid tibia. 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 She tolerated this well. She is using Iodoflex and her husband is doing a kerlix Coban on her. She arrives in clinic today with the original medial part of her mid tibia wound with muscle exposure small area on the right ankle which was part of her original wound she has 2 punched-out areas laterally and 1 above her area anteriorly. Nonviable surfaces similarly the right heel is nonviable. On the left side she has an area on the left foot and the left lateral ankle similar nonviable surfaces. 11/8; patient arrives in clinic today with nothing improved. She has a multiple lightly group of wounds on the right lower leg presumably different etiologies including a skin biopsy, the original wound she had medially and some punched-out areas that are not of clear defined etiology. She says she was in a lot of pain this week could not have her leg up on the bed did not get completely relief from dropping it over the bed. She also has a small  area on the left lateral malleolus and the left lateral foot necrotic surfaces on all of these wounds. We have been using Iodoflex I attempted to put her in 3 layer compression last week that did not go well. We will be backing off on kerlix Coban this week. 11/15; the ultrasound that I sent the patient for last week showed findings consistent with acute deep vein thrombosis involving the right distal femoral vein and right popliteal vein. I was concerned about the combination of Plavix and Xarelto however apparently Dr. [name] felt that was not a problem. She is now going  through the acute dosing of Xarelto. She has an appointment with Dr. Trula Slade on 12/5. 11/29; 10 days ago before he left for weeks vacation she called to report pain in the right leg I sent her in some doxycycline her husband thought things were getting better however she required admission to hospital from 11/22 through 11/24 prompted by finding that her hemoglobin was 5.7 she was transfused 2 units of packed cells and her hemoglobin stabilized. She did undergo an endoscopy with findings of significant esophagitis part of which was candidal and part of it reflux. She was discharged on nystatin swish and swallow which she is doing. She seems to be better from that regard. There was no active other active source of bleeding Unfortunately the combination of Xarelto and Plavix was felt to be too much for her. The Xarelto was stopped and an IVC filter was placed she is continued on Plavix. Her big complaint today is pain in the right calf really from the mid aspect superiorly. This is very tender slightly red but not particularly warm. She did see vein and vascular in the hospital I will see if I can pull their note 12/6; patient was at Dr. Stephens Shire office today. Great toe pressure on the right is 54 on the left 49 she has a 50 to 74% stenosis of the right mid popliteal on the left at 30 to 49% proximal SFA and a 50 to 74% CFA. She was treated with angioplasty of the right ATA, right TP trunk and peroneal artery as well as an angioplasty of the right SFA to popliteal artery. On 10/26 she underwent angioplasty of the left ATA and left TP trunk she has a history of a left common femoral endarterectomy and left superficial femoral artery stent as well as a right superficial femoral artery stent. In addition to her arterial disease she has significant venous disease with a history of a right greater saphenous vein ablation and a recent DVT in the right femoral and popliteal vein with insertion of an IVC  filter. She has remained on Plavix but her anticoagulant was stopped. She continues to have a lot of swelling and pain in the right leg with significant erythema which I am assuming is stasis dermatitis. She has had substantial wound areas on the right leg including right lateral a large necrotic wound with a smaller biopsy site above this. She has original wound on the left mid tibia with a small area above this as well. On the left she has a small area over the left lateral malleolus and the left lateral foot 12/22-week follow-up. Her right leg looks really terrible. She has another necrotic wound just above her original one on the right posterior leg. Greenish drainage suggestive of gram-negative/Pseudomonas infection. The entire mid part of her leg is erythematous I think most of this is stasis dermatitis rather than cellulitis. She has also a  bothersome ischemic-looking right fourth toe which I noticed today without her really complaining. She has not been systemically unwell although she looks more physically frail 12/27; I reached out to vein and vascular last weekend Dr. [name] will be seeing her on Thursday. She has a gangrenous area on the tip of the right fourth toe. Multiple areas breaking down. I think this is a combination of arterial and severe venous insufficiency she has multiple wounds in the right leg but not a lot of pain. She has 3 small areas on the left leg we have been using Santyl. On the right leg we have been using silver alginate or at least that is what they have been using Culture I did last week showed abundant is Pseudomonas aeruginosa had a few Enterococcus faecalis. I have been using Ceftin here predominantly because of the green drainage suggesting Pseudomonas I have not yet addressed the Enterococcus which was ampicillin sensitive 1/7; the patient was kindly seen by vein and vascular. She underwent a repeat ANGIOGRAM on 01/02/2021 by Dr. Donzetta Matters. She appears to have had  bilateral common femoral treatments in the past. The right side is heavily heavily calcified and she has 1 area of 50% stenosis in the SFA that is nonflow limiting. She has dominant runoff via the anterior tibial and peroneal arteries. On the left side she also has a heavily calcified SFA no flow-limiting stenosis in the popliteal artery then she she then has runoff via the anterior tibial and posterior tibial arteries without flow-limiting stenosis. She was not felt to require any intervention. She continues to have pain in the right leg with erythema although the erythema is somewhat better. Green drainage coming from the wounds on the posterior lateral right leg which are clearly necrotic. I have had her on 2-1/2 weeks of cefdinir this is while we awaited further arterial evaluation which is completed. I have now turned some thought to the status of her venous system particularly the DVT she had in November. This was an acute DVT involving the right distal femoral and right popliteal veins. She was then started on Xarelto in combination with Plavix but she required hospitalization in November with GI bleeding. She had an endoscopy by Dr. Lizbeth Bark of GI. This showed moderately severe esophagitis a combination of both food particles reflux and Candida. Localized mild inflammation characterized by erythema was found in the cardiac gastric fundus and the gastric body. The duodenal bulb's in the stages of the duodenum were normal. She then underwent a capsule endoscopy which showed evidence of oozing blood from the esophagus but no evidence of small bowel bleeding. She was not restarted on her Xarelto, she continues on Plavix and she has an IVC filter 01/12/2021; we brought this patient in for a nurse visit today however I saw her because of copious amounts of green drainage. Under 3 layer compression the degree of erythema in her lower leg on the right was better however there is indeed large amounts of  green drainage I think coming out of the large wound on the posterior lateral leg. There is also a satellite area above this. She complains of fairly constant pain in fact she called our clinic earlier in the week to report that Sorbact which we are using topically was hurting her. We explained that we had never heard Sorbact do this. She has not been systemically unwell. My thoughts are on this lady that she had recent angiograms and there is nothing further that can be done.  She has a gangrenous right fourth toe but there should be enough blood flow to heal wounds on her legs. She did not have another intervention. She is on Plavix with an IVC filter for her previous stents. She had an acute DVT in November I believe and was on Xarelto and Plavix but had a GI bleed of I think esophageal source. She has some swelling in the right thigh marked erythema and swelling in the right mid calf. I wonder whether she is extended the clot burden in her thigh as a cause of the distal DVT like symptoms. Objective Constitutional Patient is hypertensive.. Pulse regular and within target range for patient.Marland Kitchen Respirations regular, non-labored and within target range.. Temperature is normal and within the target range for the patient.Marland Kitchen Appears in no distress. Vitals Time Taken: 11:32 AM, Height: 68 in, Weight: 132 lbs, BMI: 20.1, Temperature: 98.5 F, Pulse: 75 bpm, Respiratory Rate: 18 breaths/min, Blood Pressure: 152/71 mmHg. General Notes: Wound exam; oo Right side copious green drainage which I think is from the large right posterior lateral calf wound. Copious amounts of necrotic debris. I removed some of this with pickups and a #15 scalpel and then a number of #5 curette getting down to a surface that this which was not particularly healthy looking I obtained the tissues sample for PCR culture. She has a necrotic wound above this but this is smaller and I do not think likely to be the source of the  drainage Integumentary (Hair, Skin) There is still reasonably intense erythema in the right mid lower leg region. Some tenderness. I think a lot of this is stasis dermatitis cannot exactly rule out coexistent cellulitis. Wound #16 status is Open. Original cause of wound was Pressure Injury. The wound is located on the Right Calcaneus. The wound measures 0.5cm length x 1cm width x 0.3cm depth; 0.393cm^2 area and 0.118cm^3 volume. There is Fat Layer (Subcutaneous Tissue) exposed. There is no tunneling or undermining noted. There is a small amount of serous drainage noted. The wound margin is epibole. There is small (1-33%) pink, pale granulation within the wound bed. There is a large (67-100%) amount of necrotic tissue within the wound bed including Adherent Slough. Wound #19 status is Open. Original cause of wound was Gradually Appeared. The wound is located on the Right,Lateral Malleolus. The wound measures 2.2cm length x 0.6cm width x 0.2cm depth; 1.037cm^2 area and 0.207cm^3 volume. There is Fat Layer (Subcutaneous Tissue) exposed. There is no tunneling or undermining noted. There is a small amount of purulent drainage noted. The wound margin is distinct with the outline attached to the wound base. There is small (1-33%) pink granulation within the wound bed. There is a large (67-100%) amount of necrotic tissue within the wound bed including Adherent Slough. Wound #23 status is Open. Original cause of wound was Gradually Appeared. The wound is located on the Right,Posterior Lower Leg. The wound measures 9cm length x 5.1cm width x 0.6cm depth; 36.05cm^2 area and 21.63cm^3 volume. There is muscle and Fat Layer (Subcutaneous Tissue) exposed. There is no tunneling or undermining noted. There is a large amount of purulent drainage noted. The wound margin is distinct with the outline attached to the wound base. There is no granulation within the wound bed. There is a large (67-100%) amount of necrotic  tissue within the wound bed including Eschar, Adherent Slough and Necrosis of Muscle. Wound #24 status is Open. Original cause of wound was Gradually Appeared. The wound is located on the [place]  Lower Leg. The wound measures 1.5cm length x 1cm width x 0.2cm depth; 1.178cm^2 area and 0.236cm^3 volume. There is Fat Layer (Subcutaneous Tissue) exposed. There is no tunneling or undermining noted. There is a medium amount of purulent drainage noted. The wound margin is flat and intact. There is no granulation within the wound bed. There is a large (67-100%) amount of necrotic tissue within the wound bed including Adherent Slough. Wound #25 status is Open. Original cause of wound was Gradually Appeared. The wound is located on the Left,Lateral Foot. The wound measures 1.1cm length x 0.7cm width x 0.1cm depth; 0.605cm^2 area and 0.06cm^3 volume. There is Fat Layer (Subcutaneous Tissue) exposed. There is no tunneling or undermining noted. There is a small amount of serous drainage noted. The wound margin is flat and intact. There is no granulation within the wound bed. There is a large (67- 100%) amount of necrotic tissue within the wound bed including Adherent Slough. Wound #26 status is Open. Original cause of wound was Gradually Appeared. The wound is located on the Right,Lateral Lower Leg. The wound measures 1.3cm length x 1.4cm width x 0.2cm depth; 1.429cm^2 area and 0.286cm^3 volume. Wound #27 status is Open. Original cause of wound was Gradually Appeared. The wound is located on the Right,Distal,Anterior Lower Leg. The wound measures 2cm length x 0.6cm width x 0.1cm depth; 0.942cm^2 area and 0.094cm^3 volume. Wound #29 status is Open. Original cause of wound was Gradually Appeared. The wound is located on the Right,Proximal,Lateral Lower Leg. The wound measures 1.5cm length x 1.1cm width x 0.1cm depth; 1.296cm^2 area and 0.13cm^3 volume. Wound #32 status is Open. Original cause of wound was  Gradually Appeared. The wound is located on the Right T Great. The wound measures 1.1cm length oe x 0.9cm width x 0.1cm depth; 0.778cm^2 area and 0.078cm^3 volume. Wound #5 status is Open. Original cause of wound was Gradually Appeared. The wound is located on the Right,Medial Lower Leg. The wound measures 2.5cm length x 2.5cm width x 0.2cm depth; 4.909cm^2 area and 0.982cm^3 volume. 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 There was a Excisional Skin/Subcutaneous Tissue Debridement with a total area of 45.9 sq cm performed by Ricard Dillon., MD. With the following instrument(s): Blade, Curette, and Forceps to remove Viable and Non-Viable tissue/material. Material removed includes Subcutaneous Tissue and Slough and after achieving pain control using Lidocaine Injectable: 1%. 1 specimen was taken by a Tissue Culture and sent to the lab per facility protocol. A time out was conducted at 12:04, prior to the start of the procedure. A Minimum amount of bleeding was controlled with Pressure. The procedure was tolerated well  with a pain level of 3 throughout and a pain level of  0 following the procedure. Post Debridement Measurements: 9cm length x 5.1cm width x 0.6cm depth; 21.63cm^3 volume. Character of Wound/Ulcer Post Debridement requires further debridement. Severity of Tissue Post Debridement is: Fat layer exposed. Post procedure Diagnosis Wound #23: Same as Pre-Procedure Pre-procedure diagnosis of Wound #23 is a Venous Leg Ulcer located on the Right,Posterior Lower Leg . There was a Three Layer Compression Therapy Procedure by Levan Hurst, RN. Post procedure Diagnosis Wound #23: Same as Pre-Procedure Wound #16 Pre-procedure diagnosis of Wound #16 is a Pressure Ulcer located on the Right Calcaneus . There was a Three Layer Compression Therapy Procedure by Levan Hurst, RN. Post procedure Diagnosis Wound #16: Same as Pre-Procedure Wound #19 Pre-procedure diagnosis of Wound #19 is a Venous Leg Ulcer located on the Right,Lateral Malleolus . There was a Three Layer Compression Therapy Procedure by Levan Hurst, RN. Post procedure Diagnosis Wound #19: Same as Pre-Procedure Wound #24 Pre-procedure diagnosis of Wound #24 is a Venous Leg Ulcer located on the Right,Anterior Lower Leg . There was a Three Layer Compression Therapy Procedure by Levan Hurst, RN. Post procedure Diagnosis Wound #24: Same as Pre-Procedure Wound #25 Pre-procedure diagnosis of Wound #25 is a Venous Leg Ulcer located on the Left,Lateral Foot . There was a Three Layer Compression Therapy Procedure by Levan Hurst, RN. Post procedure Diagnosis Wound #25: Same as Pre-Procedure Wound #26 Pre-procedure diagnosis of Wound #26 is a Venous Leg Ulcer located on the Right,Lateral Lower Leg . There was a Three Layer Compression Therapy Procedure by Levan Hurst, RN. Post procedure Diagnosis Wound #26: Same as Pre-Procedure Wound #27 Pre-procedure diagnosis of Wound #27 is a Venous Leg Ulcer located on the Right,Distal,Anterior Lower Leg  . There was a Three Layer Compression Therapy Procedure by Levan Hurst, RN. 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 There was a Three Layer Compression Therapy Procedure by Levan Hurst, RN. Post procedure Diagnosis Wound #29: Same as Pre-Procedure Wound #5 Pre-procedure diagnosis of Wound #5 is a Venous Leg Ulcer located on the Right,Medial Lower Leg . There was a Three Layer Compression Therapy Procedure by Levan Hurst, RN. Post procedure Diagnosis Wound #5: Same as Pre-Procedure Plan Follow-up Appointments: Other: - Keep appt for Tues 1/18 Bathing/ Shower/ Hygiene: May shower and wash wound with soap and water. - on days that dressing is changed Edema Control - Lymphedema / SCD / Other: Elevate legs to the level of the heart or above for 30 minutes daily and/or when sitting, a frequency of: - throughout the day Avoid standing for long periods of time. Exercise regularly Laboratory ordered were: Anerobic culture - PCR - Right posterior lower leg - deep tissue Services and Therapies ordered were: Venous Duplex Doppler, right upper leg (thigh), DVT rule out - **URGENT** - Follow up from DVT study on 11/07/20. WOUND #16: - Calcaneus Wound Laterality: Right Cleanser: Soap and Water 1 x Per Week/7 Days Discharge Instructions: May shower and wash wound with dial antibacterial soap and water prior to dressing change. Peri-Wound Care: Sween Lotion (Moisturizing lotion) 1 x Per Week/7 Days Discharge Instructions: Apply moisturizing lotion as directed Prim Dressing: KerraCel Ag Gelling Fiber Dressing, 4x5 in (silver alginate) 1 x Per Week/7 Days ary Discharge Instructions: Apply silver alginate to wound bed as instructed Secondary Dressing: Woven Gauze Sponge, Non-Sterile 4x4 in 1 x Per Week/7 Days Discharge Instructions: Apply over  primary dressing as directed. Secondary Dressing:  ABD Pad, 8x10 1 x Per Week/7 Days Discharge Instructions: Apply over primary dressing as directed. Com pression Wrap: ThreePress (3 layer compression wrap) 1 x Per Week/7 Days Discharge Instructions: Apply three layer compression as directed. WOUND #19: - Malleolus Wound Laterality: Right, Lateral Cleanser: Soap and Water 1 x Per Week/7 Days Discharge Instructions: May shower and wash wound with dial antibacterial soap and water prior to dressing change. Peri-Wound Care: Sween Lotion (Moisturizing lotion) 1 x Per Week/7 Days Discharge Instructions: Apply moisturizing lotion as directed Prim Dressing: KerraCel Ag Gelling Fiber Dressing, 4x5 in (silver alginate) 1 x Per Week/7 Days ary Discharge Instructions: Apply silver alginate to wound bed as instructed Secondary Dressing: Woven Gauze Sponge, Non-Sterile 4x4 in 1 x Per Week/7 Days Discharge Instructions: Apply over primary dressing as directed. Secondary Dressing: ABD Pad, 8x10 1 x Per Week/7 Days Discharge Instructions: Apply over primary dressing as directed. Com pression Wrap: ThreePress (3 layer compression wrap) 1 x Per Week/7 Days Discharge Instructions: Apply three layer compression as directed. WOUND #23: - Lower Leg Wound Laterality: Right, Posterior Cleanser: Soap and Water 1 x Per Week/7 Days Discharge Instructions: May shower and wash wound with dial antibacterial soap and water prior to dressing change. Peri-Wound Care: Sween Lotion (Moisturizing lotion) 1 x Per Week/7 Days Discharge Instructions: Apply moisturizing lotion as directed Prim Dressing: KerraCel Ag Gelling Fiber Dressing, 4x5 in (silver alginate) 1 x Per Week/7 Days ary Discharge Instructions: Apply silver alginate to wound bed as instructed Secondary Dressing: Woven Gauze Sponge, Non-Sterile 4x4 in 1 x Per Week/7 Days Discharge Instructions: Apply over primary dressing as directed. Secondary Dressing: ABD Pad,  8x10 1 x Per Week/7 Days Discharge Instructions: Apply over primary dressing as directed. Com pression Wrap: ThreePress (3 layer compression wrap) 1 x Per Week/7 Days Discharge Instructions: Apply three layer compression as directed. WOUND #24: - Lower Leg Wound Laterality: Right, Anterior Cleanser: Soap and Water 1 x Per Week/7 Days Discharge Instructions: May shower and wash wound with dial antibacterial soap and water prior to dressing change. Peri-Wound Care: Sween Lotion (Moisturizing lotion) 1 x Per Week/7 Days Discharge Instructions: Apply moisturizing lotion as directed Prim Dressing: KerraCel Ag Gelling Fiber Dressing, 4x5 in (silver alginate) 1 x Per Week/7 Days ary Discharge Instructions: Apply silver alginate to wound bed as instructed Secondary Dressing: Woven Gauze Sponge, Non-Sterile 4x4 in 1 x Per Week/7 Days Discharge Instructions: Apply over primary dressing as directed. Secondary Dressing: ABD Pad, 8x10 1 x Per Week/7 Days Discharge Instructions: Apply over primary dressing as directed. Com pression Wrap: ThreePress (3 layer compression wrap) 1 x Per Week/7 Days Discharge Instructions: Apply three layer compression as directed. WOUND #25: - Foot Wound Laterality: Left, Lateral Cleanser: Soap and Water 1 x Per Week/7 Days Discharge Instructions: May shower and wash wound with dial antibacterial soap and water prior to dressing change. Peri-Wound Care: Sween Lotion (Moisturizing lotion) 1 x Per Week/7 Days Discharge Instructions: Apply moisturizing lotion as directed Prim Dressing: Cutimed Sorbact Swab 1 x Per Week/7 Days ary Discharge Instructions: Apply to wound bed as instructed Secondary Dressing: Woven Gauze Sponge, Non-Sterile 4x4 in 1 x Per Week/7 Days Discharge Instructions: Apply over primary dressing as directed. Secondary Dressing: ABD Pad, 8x10 1 x Per Week/7 Days Discharge Instructions: Apply over primary dressing as directed. Com pression Wrap: ThreePress  (3 layer compression wrap) 1 x Per Week/7 Days Discharge Instructions: Apply three layer compression as directed. WOUND #26: - Lower Leg Wound Laterality: Right, Lateral Cleanser: Soap  and Water 1 x Per Week/7 Days Discharge Instructions: May shower and wash wound with dial antibacterial soap and water prior to dressing change. Peri-Wound Care: Sween Lotion (Moisturizing lotion) 1 x Per Week/7 Days Discharge Instructions: Apply moisturizing lotion as directed Prim Dressing: KerraCel Ag Gelling Fiber Dressing, 4x5 in (silver alginate) 1 x Per Week/7 Days ary Discharge Instructions: Apply silver alginate to wound bed as instructed Secondary Dressing: Woven Gauze Sponge, Non-Sterile 4x4 in 1 x Per Week/7 Days Discharge Instructions: Apply over primary dressing as directed. Secondary Dressing: ABD Pad, 8x10 1 x Per Week/7 Days Discharge Instructions: Apply over primary dressing as directed. Com pression Wrap: ThreePress (3 layer compression wrap) 1 x Per Week/7 Days Discharge Instructions: Apply three layer compression as directed. WOUND #27: - Lower Leg Wound Laterality: Right, Anterior, Distal Cleanser: Soap and Water 1 x Per Week/7 Days Discharge Instructions: May shower and wash wound with dial antibacterial soap and water prior to dressing change. Peri-Wound Care: Sween Lotion (Moisturizing lotion) 1 x Per Week/7 Days Discharge Instructions: Apply moisturizing lotion as directed Prim Dressing: KerraCel Ag Gelling Fiber Dressing, 4x5 in (silver alginate) 1 x Per Week/7 Days ary Discharge Instructions: Apply silver alginate to wound bed as instructed Secondary Dressing: Woven Gauze Sponge, Non-Sterile 4x4 in 1 x Per Week/7 Days Discharge Instructions: Apply over primary dressing as directed. Secondary Dressing: ABD Pad, 8x10 1 x Per Week/7 Days Discharge Instructions: Apply over primary dressing as directed. Com pression Wrap: ThreePress (3 layer compression wrap) 1 x Per Week/7  Days Discharge Instructions: Apply three layer compression as directed. WOUND #29: - Lower Leg Wound Laterality: Right, Lateral, Proximal Cleanser: Soap and Water 1 x Per Week/7 Days Discharge Instructions: May shower and wash wound with dial antibacterial soap and water prior to dressing change. Peri-Wound Care: Sween Lotion (Moisturizing lotion) 1 x Per Week/7 Days Discharge Instructions: Apply moisturizing lotion as directed Prim Dressing: KerraCel Ag Gelling Fiber Dressing, 4x5 in (silver alginate) 1 x Per Week/7 Days ary Discharge Instructions: Apply silver alginate to wound bed as instructed Secondary Dressing: Woven Gauze Sponge, Non-Sterile 4x4 in 1 x Per Week/7 Days Discharge Instructions: Apply over primary dressing as directed. Secondary Dressing: ABD Pad, 8x10 1 x Per Week/7 Days Discharge Instructions: Apply over primary dressing as directed. Com pression Wrap: ThreePress (3 layer compression wrap) 1 x Per Week/7 Days Discharge Instructions: Apply three layer compression as directed. WOUND #32: - T Great Wound Laterality: Right oe Prim Dressing: KerraCel Ag Gelling Fiber Dressing, 4x5 in (silver alginate) Every Other Day/30 Days ary Discharge Instructions: Apply silver alginate to wound bed as instructed Secondary Dressing: Woven Gauze Sponges 2x2 in Every Other Day/30 Days Discharge Instructions: Apply over primary dressing as directed. Secured With: Child psychotherapist, Sterile 2x75 (in/in) Every Other Day/30 Days Discharge Instructions: Secure with stretch gauze as directed. Secured With: Paper T ape, 1x10 (in/yd) Every Other Day/30 Days Discharge Instructions: Secure dressing with tape as directed. WOUND #5: - Lower Leg Wound Laterality: Right, Medial Cleanser: Soap and Water 1 x Per Week/7 Days Discharge Instructions: May shower and wash wound with dial antibacterial soap and water prior to dressing change. Peri-Wound Care: Sween Lotion (Moisturizing  lotion) 1 x Per Week/7 Days Discharge Instructions: Apply moisturizing lotion as directed Prim Dressing: KerraCel Ag Gelling Fiber Dressing, 4x5 in (silver alginate) 1 x Per Week/7 Days ary Discharge Instructions: Apply silver alginate to wound bed as instructed Secondary Dressing: Woven Gauze Sponge, Non-Sterile 4x4 in 1 x Per Week/7 Days  Discharge Instructions: Apply over primary dressing as directed. Secondary Dressing: ABD Pad, 8x10 1 x Per Week/7 Days Discharge Instructions: Apply over primary dressing as directed. Com pression Wrap: ThreePress (3 layer compression wrap) 1 x Per Week/7 Days Discharge Instructions: Apply three layer compression as directed. 1. Aggressive debridement as noted 2. PCR culture obtained 3. Ultrasound of the right thigh looking at the clot burden in her veins in this area. I think the original DVT showed clot in the popliteal and SFA although I will need to check this. I will communicate with Dr. Trula Slade 4. If the DVT has extended I wonder about using Lovenox as an anticoagulant and stopping the Plavix. Lovenox would have the benefit of course of a much shorter half-life than the oral agents available. Her original GI bleeding was from a lower esophageal source. Capsule endoscopy did not show any other bleeding source in the small bowel Electronic Signature(s) Signed: 01/12/2021 5:01:02 PM By: Linton Ham MD Entered By: Linton Ham on 01/12/2021 12:32:14 -------------------------------------------------------------------------------- SuperBill Details Patient Name: Date of Service: [name] [name], [name] [name]. 01/12/2021 Medical Record Number: [id number] Patient Account Number: [id number] Date of Birth/Sex: Treating RN: 09/03/1951 (69 y.o. 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 I87.331 Chronic venous hypertension (idiopathic) with ulcer and inflammation of right lower extremity L89.616 Pressure-induced deep tissue damage of right heel L97.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 L97.528 Non-pressure chronic ulcer of other part of left foot with other specified severity I70.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 I70.248 Atherosclerosis of native arteries of left leg with ulceration of other part of lower leg Facility Procedures CPT4 Code: [id number] Description: 62376 - DEB SUBQ TISSUE 20 SQ CM/< ICD-10 Diagnosis Description L97.811 Non-pressure chronic ulcer of other part of right lower leg limited to breakdown of s Modifier: kin Quantity: 1 CPT4 Code: [id number] Description: 16073 - DEB SUBQ TISS EA ADDL 20CM ICD-10 Diagnosis Description L97.811 Non-pressure chronic ulcer of other part of right lower leg limited to breakdown of s Modifier: kin Quantity: 2 CPT4 Code: [id number] Description: (Facility Use Only) [id number] - APPLY MULTLAY COMPRS LWR RT LEG Modifier: 23 Quantity: 1 Physician Procedures : CPT4 Code Description Modifier [id number] 11042 - WC PHYS SUBQ TISS 20 SQ CM ICD-10 Diagnosis Description L97.811 Non-pressure chronic ulcer of other part of right lower leg limited to breakdown of skin Quantity: 1 : [id number] 11045 - WC PHYS SUBQ TISS EA ADDL 20 CM ICD-10 Diagnosis Description L97.811 Non-pressure chronic ulcer of other part of right lower leg limited to breakdown of skin Quantity: 2 Electronic Signature(s) Signed: 01/12/2021 5:01:02 PM By: Linton Ham MD Signed: 01/12/2021 5:28:50 PM By: Levan Hurst  RN, BSN Entered By: Levan Hurst on 01/12/2021 16:45:34

## 2021-01-13 ENCOUNTER — Other Ambulatory Visit (HOSPITAL_COMMUNITY): Payer: Self-pay | Admitting: Internal Medicine

## 2021-01-13 ENCOUNTER — Other Ambulatory Visit: Payer: Self-pay | Admitting: Internal Medicine

## 2021-01-13 ENCOUNTER — Telehealth: Payer: Self-pay | Admitting: Internal Medicine

## 2021-01-13 ENCOUNTER — Ambulatory Visit (HOSPITAL_COMMUNITY)
Admission: RE | Admit: 2021-01-13 | Discharge: 2021-01-13 | Disposition: A | Discharge status: Home / Self Care | Payer: Medicare Other | Source: Ambulatory Visit | Attending: Internal Medicine | Admitting: Internal Medicine

## 2021-01-13 ENCOUNTER — Encounter (HOSPITAL_COMMUNITY): Payer: Medicare Other

## 2021-01-13 DIAGNOSIS — K2100 Gastro-esophageal reflux disease with esophagitis, without bleeding: Secondary | ICD-10-CM | POA: Diagnosis not present

## 2021-01-13 DIAGNOSIS — I272 Pulmonary hypertension, unspecified: Secondary | ICD-10-CM | POA: Diagnosis not present

## 2021-01-13 DIAGNOSIS — R600 Localized edema: Secondary | ICD-10-CM

## 2021-01-13 DIAGNOSIS — I739 Peripheral vascular disease, unspecified: Secondary | ICD-10-CM | POA: Diagnosis not present

## 2021-01-13 DIAGNOSIS — D509 Iron deficiency anemia, unspecified: Secondary | ICD-10-CM | POA: Diagnosis not present

## 2021-01-13 LAB — COMPLETE METABOLIC PANEL WITH GFR
AG Ratio: 1 (calc) (ref 1.0–2.5)
ALT: 10 U/L (ref 6–29)
AST: 18 U/L (ref 10–35)
Albumin: 3.3 g/dL — ABNORMAL LOW (ref 3.6–5.1)
Alkaline phosphatase (APISO): 74 U/L (ref 37–153)
BUN/Creatinine Ratio: 27 (calc) — ABNORMAL HIGH (ref 6–22)
BUN: 34 mg/dL — ABNORMAL HIGH (ref 7–25)
CO2: 29 mmol/L (ref 20–32)
Calcium: 9.1 mg/dL (ref 8.6–10.4)
Chloride: 99 mmol/L (ref 98–110)
Creat: 1.25 mg/dL — ABNORMAL HIGH (ref 0.60–0.93)
GFR, Est African American: 50 mL/min/{1.73_m2} — ABNORMAL LOW (ref >=60)
GFR, Est Non African American: 43 mL/min/{1.73_m2} — ABNORMAL LOW (ref >=60)
Globulin: 3.2 g/dL (calc) (ref 1.9–3.7)
Glucose, Bld: 81 mg/dL (ref 65–99)
Potassium: 3.7 mmol/L (ref 3.5–5.3)
Sodium: 139 mmol/L (ref 135–146)
Total Bilirubin: 0.4 mg/dL (ref 0.2–1.2)
Total Protein: 6.5 g/dL (ref 6.1–8.1)

## 2021-01-13 LAB — IRON,TIBC AND FERRITIN PANEL
%SAT: 15 % (calc) — ABNORMAL LOW (ref 16–45)
Ferritin: 179 ng/mL (ref 16–288)
Iron: 41 ug/dL — ABNORMAL LOW (ref 45–160)
TIBC: 269 mcg/dL (calc) (ref 250–450)

## 2021-01-13 LAB — MICROALBUMIN / CREATININE URINE RATIO
Creatinine, Urine: 25 mg/dL (ref 20–275)
Microalb Creat Ratio: 244 mcg/mg creat — ABNORMAL HIGH (ref <30)
Microalb, Ur: 6.1 mg/dL

## 2021-01-13 LAB — CBC WITH DIFFERENTIAL/PLATELET
Absolute Monocytes: 670 cells/uL (ref 200–950)
Basophils Absolute: 19 cells/uL (ref 0–200)
Basophils Relative: 0.2 %
Eosinophils Absolute: 28 cells/uL (ref 15–500)
Eosinophils Relative: 0.3 %
HCT: 27.3 % — ABNORMAL LOW (ref 35.0–45.0)
Hemoglobin: 8.6 g/dL — ABNORMAL LOW (ref 11.7–15.5)
Lymphs Abs: 651 cells/uL — ABNORMAL LOW (ref 850–3900)
MCH: 30 pg (ref 27.0–33.0)
MCHC: 31.5 g/dL — ABNORMAL LOW (ref 32.0–36.0)
MCV: 95.1 fL (ref 80.0–100.0)
MPV: 9.4 fL (ref 7.5–12.5)
Monocytes Relative: 7.2 %
Neutro Abs: 7933 cells/uL — ABNORMAL HIGH (ref 1500–7800)
Neutrophils Relative %: 85.3 %
Platelets: 473 10*3/uL — ABNORMAL HIGH (ref 140–400)
RBC: 2.87 10*6/uL — ABNORMAL LOW (ref 3.80–5.10)
RDW: 15.9 % — ABNORMAL HIGH (ref 11.0–15.0)
Total Lymphocyte: 7 %
WBC: 9.3 10*3/uL (ref 3.8–10.8)

## 2021-01-13 LAB — LIPID PANEL
Cholesterol: 176 mg/dL (ref <200)
HDL: 64 mg/dL (ref >=50)
LDL Cholesterol (Calc): 95 mg/dL (calc)
Non-HDL Cholesterol (Calc): 112 mg/dL (calc) (ref <130)
Total CHOL/HDL Ratio: 2.8 (calc) (ref <5.0)
Triglycerides: 76 mg/dL (ref <150)

## 2021-01-13 LAB — TSH: TSH: 4.34 mIU/L (ref 0.40–4.50)

## 2021-01-13 LAB — TEST AUTHORIZATION

## 2021-01-13 LAB — B12 AND FOLATE PANEL
Folate: 21.3 ng/mL
Vitamin B-12: 675 pg/mL (ref 200–1100)

## 2021-01-13 MED ORDER — CITALOPRAM HYDROBROMIDE 40 MG PO TABS: 40.0000 mg | ORAL_TABLET | Freq: Every day | ORAL | 3 refills | Status: DC

## 2021-01-13 NOTE — Telephone Encounter (Signed)
[name] [name] [phone]  Al called to check on prescription for increasing [place]'s Celexa.

## 2021-01-13 NOTE — Telephone Encounter (Signed)
So please call him and tell him there was a pharmacy concern about a prolonged Q-T interval with this med and her history of low magnesium etc. I have ordered it after having reviewed her  latest EKG. Let's see her in 4 weeks and get EKG then and see how increased dose is working. Please be patient with [facility] referral. May take a few weeks.

## 2021-01-13 NOTE — Telephone Encounter (Signed)
Called and spoke with [name] [name], he verbalized understanding. They will call back to schedule office visit in 4 weeks to follow up

## 2021-01-17 ENCOUNTER — Other Ambulatory Visit: Payer: Self-pay

## 2021-01-17 ENCOUNTER — Encounter (HOSPITAL_BASED_OUTPATIENT_CLINIC_OR_DEPARTMENT_OTHER): Payer: Medicare Other | Admitting: Internal Medicine

## 2021-01-17 DIAGNOSIS — L97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: Secondary | ICD-10-CM | POA: Diagnosis not present

## 2021-01-17 DIAGNOSIS — I70248 Atherosclerosis of native arteries of left leg with ulceration of other part of lower left leg: Secondary | ICD-10-CM | POA: Diagnosis not present

## 2021-01-17 DIAGNOSIS — L97212 Non-pressure chronic ulcer of right calf with fat layer exposed: Secondary | ICD-10-CM | POA: Diagnosis not present

## 2021-01-17 DIAGNOSIS — L97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: Secondary | ICD-10-CM | POA: Diagnosis not present

## 2021-01-17 DIAGNOSIS — L97512 Non-pressure chronic ulcer of other part of right foot with fat layer exposed: Secondary | ICD-10-CM | POA: Diagnosis not present

## 2021-01-17 DIAGNOSIS — L97811 Non-pressure chronic ulcer of other part of right lower leg limited to breakdown of skin: Secondary | ICD-10-CM | POA: Diagnosis not present

## 2021-01-17 DIAGNOSIS — L97528 Non-pressure chronic ulcer of other part of left foot with other specified severity: Secondary | ICD-10-CM | POA: Diagnosis not present

## 2021-01-17 DIAGNOSIS — L97812 Non-pressure chronic ulcer of other part of right lower leg with fat layer exposed: Secondary | ICD-10-CM | POA: Diagnosis not present

## 2021-01-17 DIAGNOSIS — L97322 Non-pressure chronic ulcer of left ankle with fat layer exposed: Secondary | ICD-10-CM | POA: Diagnosis not present

## 2021-01-17 DIAGNOSIS — I70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 lower right leg: Secondary | ICD-10-CM | POA: Diagnosis not present

## 2021-01-18 ENCOUNTER — Other Ambulatory Visit: Payer: Self-pay

## 2021-01-18 ENCOUNTER — Telehealth (HOSPITAL_COMMUNITY): Payer: Self-pay | Admitting: Pharmacy Technician

## 2021-01-18 DIAGNOSIS — D500 Iron deficiency anemia secondary to blood loss (chronic): Secondary | ICD-10-CM | POA: Insufficient documentation

## 2021-01-18 DIAGNOSIS — K2100 Gastro-esophageal reflux disease with esophagitis, without bleeding: Secondary | ICD-10-CM | POA: Insufficient documentation

## 2021-01-18 DIAGNOSIS — K2940 Chronic atrophic gastritis without bleeding: Secondary | ICD-10-CM | POA: Insufficient documentation

## 2021-01-18 DIAGNOSIS — K2211 Ulcer of esophagus with bleeding: Secondary | ICD-10-CM | POA: Insufficient documentation

## 2021-01-18 DIAGNOSIS — I779 Disorder of arteries and arterioles, unspecified: Secondary | ICD-10-CM

## 2021-01-18 DIAGNOSIS — Z9981 Dependence on supplemental oxygen: Secondary | ICD-10-CM | POA: Insufficient documentation

## 2021-01-18 DIAGNOSIS — F411 Generalized anxiety disorder: Secondary | ICD-10-CM | POA: Insufficient documentation

## 2021-01-18 NOTE — Progress Notes (Signed)
VASHON, ARCH ([id number]) Visit Report for 01/17/2021 Debridement Details Patient Name: Date of Service: [name] 01/17/2021 10:45 A M Medical Record Number: [id number] Patient Account Number: [id number] Date of Birth/Sex: Treating RN: 06-07-1951 (70 y.o. [name], [name] Primary Care Provider: [name] Other Clinician: Referring Provider: Treating Provider/Extender: [name] in Treatment: 460 Debridement Performed for Assessment: Wound #23 Right,Posterior Lower Leg Performed By: Physician Ricard Dillon., MD Debridement Type: Debridement Severity of Tissue Pre Debridement: Fat layer exposed Level of Consciousness (Pre-procedure): Awake and Alert Pre-procedure Verification/Time Out Yes - 11:55 Taken: Start Time: 11:55 Pain Control: Other : Benzocaine T Area Debrided (L x W): otal 9.5 (cm) x 4 (cm) = 38 (cm) Tissue and other material debrided: Viable, Non-Viable, Slough, Subcutaneous, Skin: Dermis , Slough Level: Skin/Subcutaneous Tissue Debridement Description: Excisional Instrument: Blade, Curette, Forceps Bleeding: Minimum Hemostasis Achieved: Pressure End Time: 11:56 Procedural Pain: 0 Post Procedural Pain: 0 Response to Treatment: Procedure was tolerated well Level of Consciousness (Post- Awake and Alert procedure): Post Debridement Measurements of Total Wound Length: (cm) 9.5 Width: (cm) 10 Depth: (cm) 0.5 Volume: (cm) 37.306 Character of Wound/Ulcer Post Debridement: Improved Severity of Tissue Post Debridement: Fat layer exposed Post Procedure Diagnosis Same as Pre-procedure Electronic Signature(s) Signed: 01/17/2021 5:23:51 PM By: Linton Ham MD Signed: 01/18/2021 5:25:13 PM By: Rhae Hammock RN Entered By: Linton Ham on 01/17/2021 12:15:45 -------------------------------------------------------------------------------- HPI Details Patient Name: Date of Service: [name], [name] 01/17/2021  10:45 A M Medical Record Number: [id number] Patient Account Number: [id number] Date of Birth/Sex: Treating RN: 07/26/1951 (70 y.o. [name], [name] Primary Care Provider: [name] Other Clinician: Referring Provider: Treating Provider/Extender: [name] in Treatment: 39 History of Present Illness HPI Description: this is a patient who initially came to [place] for wounds on the medial malleoli bilaterally as well as her upper medial lower extremities bilaterally. These wounds eventually healed with assistance of Apligraf's bilaterally. While this was occurring she developed the current wound which opened into a fairly substantial wound on the right lower extremity. These are mostly secondary to venous stasis physiology however the patient also has underlying scleroderma, pulmonary hypertension. The wound has been making good progress lately with the Hydrofera Blue-based dressing. 03/17/2015; patient had a arterial evaluation a year ago. Her right ABI was 0.86 left was 1.0. T brachial index was 0.41 on the right 0.45 on the left. Her oe bilateral common femoral artery waveforms were triphasic. Her white popliteal posterior tibial artery and anterior tibial artery waveforms were monophasic with good amplitude. Luteal artery waveforms were biphasic it was felt that her bilateral great toe pressures are of normal although adequate for tissue healing. 03/24/2015. The condition of this wound is not really improved that. He was covered with as fibrinous surface slough and eschar. This underwent an aggressive debridement with both a curette and scalpel. I still cannot really get down to what I can would consider to be a viable surface. There is no evidence of infection. Previous workup for ischemia roughly a year ago was negative nevertheless I think that continues to be a concern 04/07/15. The patient arrived for application of second Apligraf. Once again the surface of this  wound is certainly less viable than I would like for an advanced treatment option. An aggressive debridement was done. She developed some arteriolar bleeding which required that along pressure and silver alginate. 04/21/15: change in the condition of this wound. Once  again it is covered in a gelatinous surface slough. After debridement today surface of the wound looks somewhat better but now a heeling surface 05/05/15 Apligraf #4 applied.Still a lot of slough on this wound. 05/19/15 Apligraf #5 applied. Still a lot of slough on this wound I did not aggressively remove this 06/02/15; continued copious amounts of surface slough. This debridement fairly easily. 06/08/2015 -- the last time she had a venous duplex study done was over 3 years ago and the surgery was prior to that. I have recommended that she sees Dr. early for a another opinion regarding a repeat venous duplex and possibly more endovenous ablation of vein stripping of micro-phlebotomies. 06/16/15; wound has a gelatinous surface eschar that the debridement fairly easily to a point. I don't disagree with the venous workup and perhaps even arterial re-evaluation. She is on prednisone 5 mg and continue his medications for her pulmonary artery hypertension I am not sure if the latter have any wound care healing issues I would need to investigate this. 06/23/15 continues with a gelatinous surface eschar with of fibrinous underlying. What I can see of this wound does not look unhealthy however I just can't get this material which I think is 2 different layers off. Empiric culture done 06/30/15; unfortunately not a lot of change in this wound. A gelatinous surface eschar is easily removed however it has a tight fibrinous surface underneath the. 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 The wound comes back and basically and unchanged state the. She has a gelatinous surface that is more easily removed however there is a tightly fibrinous  surface underneath the. There is no evidence of infection. She has a vascular follow-up next month. I would have to inject her in order to do a more aggressive debridement of this area 07/21/15 the wound is roughly in the same state albeit the debridement is done with greater ease. There is less of the fibrinous eschar underneath the. There is no evidence of infection. She has follow-up with vascular surgery next week. No evidence of surrounding infection. Her original distal wounds healed while this one formed. 07/28/15; wound is easier to debride. No wound erythema. She is seeing Dr. [name] next week. 08/18/15 Has been to [place] for repeat ablation. Have been using medihoney pad with some improvement 08/25/15; absolutely no change in the condition of this wound in either its overall size or surface condition in many months now. At one point I had this down to [place] healthier surface I think with [facility] however this did not actually progress towards closure. Do not believe that the wound has actually deteriorated in terms of volume at all. We have been using a medihoney pad which allows easier debridement of the gelatinous eschar but again no overall actual improvement. the patient is going towards an ablation with pain and vascular which I think is scheduled for next week. The only other investigation that I could first see would be a biopsy. She does have underlying scleroderma 09/02/15 eschar is much easier to debridement however the base of this does not look particularly vibrant. We changed to Iodoflex. The patient had her ablation earlier this week 09/09/15 again the debridement over the base of this wound is easier and the base of the wound looks considerably better. We will continue the Iodoflex. Dr. Tawni Millers has expressed his satisfaction with the result of her ablation 09/15/15 once again the wound is relatively free of surface eschar. No  debridement was done today. It has a pale-looking  base to it. although this is not as deep as it once was it seems to be expanding especially inferiorly. She has had recent venous ablation but this is no closer to healing.I've gone ahead and done a punch biopsy this from the inferior part of the wound close to normal skin 09/22/15: the wound is relatively free of surface eschar. There is some surrounding eschar. I'm not exactly sure at what level the surface is that I am seeing. 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 No evidence of atypical infection, malignancy. 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 No debridement was done 09/29/15; the wound surface is relatively free of surface eschar. There is eschar surrounding the walls of the wound. Aside from the improvement in the amount of surface slough. This wound has not progressed any towards closure. There is not even a surface that looks like there at this is ready. There is no evidence of any infection nor maligancy based on biopsy I did on 9/15. I continued with the Iodoflex however I am looking towards some alternative to try to promote some closure or filling in of this surface. Consider triple layer [place]. Collagen did not result in adequate control of the surface slough 10/13/15; the patient was in hospital last week with severe anemia. The wound looks somewhat better after debridement although there is widening medially. There is no evidence of infection. 10/20/15; patient's wound on the right lateral lower leg is essentially unchanged. This underwent a light surface debridement and in general the debridement is easier and the surface looks improved. I noted in doing this on the side of the wound what appears to be a piece of calcium deposition. The patient noted that she had previous things on the tips of her fingers. 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  She follows with rheumatology and I have asked them to talk to her about this. In view of that S the nonhealing ulcer may have something to do with calcinosis and also unrelieved Raynaud's disease in this area. I should note that her original wounds on the right and left medial malleolus and the inner aspects of both legs just below the knees did however heal over 10/30/15; the patient's wound on the right lateral lower leg is essentially unchanged. I was able to remove some calcified material from the medial wound edge. 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 Otherwise the wound appears essentially unchanged there is less adherent surface eschar. Some of the calcified material was sent to pathology for analysis 11/17/15. The patient's wound on the right lateral leg is essentially unchanged. Wider Medially. 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 There is some surface slough but I did not debridement this. No evidence of surrounding infection. 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 This will make healing difficult 12/15/15; the wound has less depth surface has a fibrinous slough and calcifications in the wound edge no evidence of infection 12/22/15; the wound definitely has less Fibrinous slough on the base. Calcifications around the wound edges are still evident. Although the wound  bed looks healthier it is still pale in appearance. Previous biopsy did not show malignancy 01/04/15; surgical debridement of nonviable slough and subcutaneous tissue the wound cleans up quite nicely but appears to be expanding outward calcifications around the wound edges are still  there. 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 She is to see her rheumatologist I'll see if he has any opinions on this. My punch biopsy done in September did not show calcifications although these are clearly evident. 01/19/16 light selective debridement of nonviable surface slough. There is epithelialization medially. This gives me reason for cautious optimism. She has been to see her rheumatologist, there is nothing that can be done for this type of soft tissue calcification associated with scleroderma 02/02/16 no debridement although there is a light surface slough. She has 2 peninsulas of skin 1 inferiorly and one medially. We continue to make a slight and slow but definite progressive here 02/16/16; light surface debridement with more attention to the circumference of the wound bed where the fibrinous eschar is more prevalent. No calcifications detected. She seems to have done nicely with the [facility] with some epithelialization and some improvement in the overall wound volume. 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 Continued attention to the circumference of the wound where the fibrinous eschar in calcinosis or prevalent. No calcifications were detected. She has continued improvement with Hydrofera Blue. The wound is no longer as deep 03/15/16 surgical debridement done to remove surface escha Especially around the circumference of the wound where there is nonviable subcutaneous tissue. In spite of this there is considerable improvement in the overall dimensions and depth of the wound. Islands of epithelialization are seen especially medially inferiorly and superiorly to a lesser extent. She is using Hydrofera Blue at home 03/29/16; surgical debridement done to remove surface eschar and nonviable subcutaneous tissue. This  cleans up quite nicely mention slightly larger in terms of length and width however depth is less 04/12/16; continued gradual improvement in terms of depth and the condition of the wound base. Debridement is done. Continuing long standing Hydrofera Blue at home with Kerlix Coban wraps 04/26/16; continued gradual improvement in terms of depth and management as well as condition of the wound base. Surgical debridement done she continues with Hydrofera Blue. 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 She initially came to this clinic venous insufficiency ulcers which have long since healed 05/17/16 continued improvement in terms of the depth and measurements of this wound although she has a tightly adherent fibrinous slough each time. We've been continued with long standing Hydrofera Blue which seems to done as well for this wound is many advanced treatment options. 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 She also has chronic venous insufficiency. She has an irritation on her lateral right ankle secondary to our wraps 05/10/16; wound appears to be smaller especially on the medial aspect and especially in the width. Wound was debridement surface looks better. She is also been in the hospital apparently with anemia again she tells me she had an endoscopy. Since she got home after 3 days which I believe was sometime last week she has had an irritated painful area on the right lateral ankle surrounding the lateral malleolus 05/31/16; much more adherent surface slough today then recently although I don't think the  dimensions of changed that much. A more aggressive debridement is required. The irritated area over her medial malleolus is more pruritic and painful and I don't think represents cellulitis 06/14/16; no major change in her wound dimensions however there is more tightly adherent surface slough which is disappointing. As well as  she appears to have a new small area medially. Furthermore an irritated uncomfortable area on the lateral aspect of her right foot just below the lateral malleolus. 06/21/16; I'm seeing the patient back in follow-up for the new areas under her major wound on the right anterior leg. She has been using Hydrofera Blue to this area probably for several months now and although the dressings seem to be helping for quite a period of time I think things have stagnated lately. She comes in today with a relatively tight adherent surface slough and really no changes in the wound shape or dimension. The 2 small areas she had inferiorly are tiny but still open they seem improved this well. There is no uncontrolled edema and I don't think there is any evidence of cellulitis. 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 Patient has had vascular evaluations both venous and arterial. I have biopsied this area. There is no obvious infection. The worrisome thing today is that she seems to be developing areas of erythema and epithelial damage on the medial aspect of the right foot. Also to a lesser degree inferior to the actual wound itself. Again I see no obvious changes to suggest cellulitis however as this is the only treatable option I will probably give her antibiotics. 07/13/16 no major change in the lady's large anterior right leg wound. Still covered with a very tightly adherent surface slough which is difficult to debridement. There is less erythema around this, culture last week grew pseudomonas I gave her ciprofloxacin. The area on her lateral right malleolus looks better- 08/02/16 the patient's wounds continued to decline. Her original large anterior right leg wound looks deeper. Still adherent surface slough that is difficult to debridement. She has a small area just below this and a punched-out wound just  below her lateral malleolus. In the meantime she is been in hospital with apparently an upper GI bleed on Plavix and aspirin. She is now just on Plavix she received 3 units of packed cells 08/23/16; since I last saw this 3 weeks ago, the open large area on her right leg looks about the same syrup. She has a small satellite lesion just underneath this. The area on her medial right ankle is now a deep necrotic wound. I attempted to debridement this however there is just too much pain. 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 She follows with vascular surgery and is scheduled for an angiogram in early September 09/06/16; the patient is going for an arteriogram tomorrow. Her original large wound on the right calf is about the same the satellite lesion underneath it is about the same however the area on her medial ankle is now deeper with exposed tendon. I am no longer attempting to debride these wounds 09/20/16; the patient has undergone a right femoral endarterectomy and Dacron patch angioplasty. This seems to have helped the flow in her right leg. 10/04/16; Arrived today for aggressive debridement of the wounds on her right calf the original wound the one beneath  it and a difficult area over her right lateral leg just above the lateral malleolus. 10/25/16; her 3 open wounds are about the same in terms of dimensions however the surface appears a lot healthier post debridement. Using Iodoflex 10/18/16 we have been using Iodoflex to her wounds which she tolerated with some difficulty. 10/11/16; has been using Santyl for a period of time with some improvement although again very adherent surface slough would prevent any attempted healing this. She has a original wound on the left calf, the satellite underneath that and the most recent wound on the right medial ankle. She has completed revascularization by Dr. early and has had venous  ablation earlier. Want to go back to Iodoflex to see if week and get a healthier surface to this wound bed failing this I think she'll need to be taken to the OR and I am prepared to call Dr. Marla Roe to discuss this. She is obviously not a good candidate for general anesthesia however.; 11/08/16; I put her on Iodoflex last time to see if I can get the wound bed any healthier and unfortunately today still had tremendous surface slough. 11/15/16; 4 weeks' worth of Iodoflex with not much improvement. Debridement on the major wounds on her left anterior leg is easier however this does not maintain from week to week. The punched-out area on her medial right ankle 11/29/16; I attempted to change the patient last visit to [facility] however she states this burned and was very uncomfortable therefore we gave her permission to go back to the eye out of complex which she already had at home. Also she noted a lot of pain and swelling on the lateral aspect of her leg before she traveled to [facility] for the holidays, I called her in doxycycline over the phone. This seems to have helped 12/06/16; Wounds unchanged by in large. Using Iodoflex 12/13/16; her wounds today actually looks somewhat better. The area on the right lateral lower leg has reasonably healthy-looking granulation and perhaps as actually filled and a bit. Debridement of the 2 wounds on the medial calf is easier and post debridement appears to have a healthier base. We have referred her to Dr. Migdalia Dk for consideration of operative debridement 12/20/16; we have a quick note from Dr. Merri Ray who feels that the patient needs to be referred to an academic center/plastic surgeon. This is due to the complexity of the patient's medical issues as it applies to anything in the OR. We have been using Iodoflex 12/27/16; in general the wounds on the right leg are better in terms of the difficult to remove surface slough. She has  been using Iodoflex. She is approved for Apligraf which I anticipated ordering in the next week or 2 when we get a better-looking surface 01/04/16 the deep wounds on the right leg generally look better. Both of them are debrided further surface slough. The area on the lateral right leg was not debrided. She is approved for Apligraf I think I'll probably order this either next week or the week after depending on the surface of the wounds superiorly. We have been using Iodoflex which will continue until then 01/11/16; the deep wounds on the right leg again have a surface slough that requires debridement. I've not been able to get the wound bed on either one of these wounds down to what would be acceptable for an advanced skin stab-like Apligraf. The area on the medial leg has been improving. We have been using hot Iodoflex  to all wounds which seems to do the best at at least limiting the nonviable surface 01/24/17; we have continued Iodoflex and all her wound areas. Her debridement Gen. he is easier and the surface underneath this looks viable. Nevertheless these are large area wounds with exposed muscle at least on the anterior parts. We have ordered Apligraf's for 2 weeks from now. The patient will be away next week 02/07/17; the patient was close to have first Apligraf today however we did not order one. I therefore replaced her Iodoflex. She essentially has 3 large punched- out areas on her right anterior leg and right medial ankle. 02/11/17; Apligraf #1 02/25/17; Apligraf #2. In general some improvement in the right medial ankle and right anterior leg wounds. The larger wound medially perhaps some better 03/11/17; Apligraf #3. In general continued improvement in the right medial ankle and the right anterior leg wounds. 03/25/17 Apligraf #4. In general continued improvement especially on the right medial ankle and the lower 04/08/17; Apligraf #5 in general continued improvement in all wound sites. 04/22/17;  post Apligraf #5 her wound beds continued to look a lot better all of them up to the surface of the surrounding skin. Had a caramel-colored slough that I did not debrided in case there is residual Apligraf effect. The wounds are as good as I have seen these looking quite some time. 04/29/17; we applied [place] and last week after completing her Apligraf. Wounds look as though they've contracted somewhat although they have a nonviable surface which was problematic in the past. Apparently she has been approved for further Apligraf's. We applied Iodosorb today after debridement. 05/06/17; we're fortunate enough today to be able to apply additional Apligraf approved by her insurance. In general all of her wounds look better Apligraf #6 05/24/17; Apligraf #7 continued improvement in all wounds 3 06/10/17 Apligraf #8. Continued improvement in the surface of all wounds. Not much of an improvement in dimensions as I might a follow 06/24/17 Apligraf #9 continued general improvement although not as much change in the wound areas I might of like. She has a new open area on the right anterior lateral ankle very small and superficial. She also has a necrotic wound on the tip of her right index finger probably secondary to severe uncontrolled Raynaud's phenomenon. She is already on sildenafil and already seen her rheumatologist who gave her Keflex. 07/08/17; Apligraf #10. Generalized improvement although she has a small additional wound just medial to the major wound area. 07/22/17; after discussion we decided not to reorder any further Apligraf's although there is been considerable improvement with these it hasn't been recently. The major wound anteriorly looks better. Smaller wounds beneath this and the more recent one and laterally look about the same. The area on the right lateral lower leg looks about the same 07/29/17 this is a patient who is exceedingly complex. She has advanced scleroderma, crest syndrome  including calcinosis, PAD status post revascularization, chronic venous insufficiency status post ablations. She initially presented to this clinic with wounds on her bilateral lower legs however these closed. More recently we have been dealing with a large open area superiorly on the right anterior leg, a smaller wound underneath this and then another one on the right medial lower extremity. These improve significantly with 10 Apligraf applications. Over the last 2-3 weeks we are making good progress with Hydrofera Blue and these seem to be making progress towards closure 08/19/17; wounds continued to make good improvement with Hydrofera Blue and episodic  aggressive debridements 08/26/17; still using Hydrofera Blue. Good improvements 09/09/17; using Hydrofera Blue continued improvement. Area on the lateral part of her right leg has only a small remaining open area. The small inferior satellite region is for all intents and purposes closed. Her major wound also is come in in terms of depth and has advancing epithelialization. 09/16/17; using Hydrofera Blue with continued improvement. The smaller satellite wound. We've closed out today along with a new were satellite wound medially. The area on her medial ankle is still open and her major wound is still open but making improvement. All using Hydrofera Blue. Currently 09/30/17; using Hydrofera Blue. Still a small open area on the lateral right ankle area and her original major wound seems to be making gradual and steady improvement. 10/14/17; still using Hydrofera Blue. Still too small open areas on the right lateral ankle. Her original major wound is horizontal and linear. The most problematic area paradoxically seems to be the area to the medial area wears I thought it would be the lateral. 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 10/28/17; still using Hydrofera Blue. Right lateral ankle has a very small  open area superiorly on the most lateral part of the wound. Her original open wound has 2 open areas now separated by normal skin and we've redefined this. 11/11/17; still using Hydrofera Blue area and right lateral ankle continues to have a small open area on mostly the lateral part of the wound. Her original wound has 2 small open areas now separated by a considerable amount of normal skin 11/28/17; the patient called in slightly before Thanksgiving to report pain and erythema above the wound on the right leg. In the past this is responded well to treatment for cellulitis and I gave her over the phone doxycycline. She stated this resulted in fairly abrupt improvement. We have been using Hydrofera Blue for a prolonged period of time to the larger wound anteriorly into the remaining wound on her right right lateral ankle. The latter is just about closed with only a small linear area and the bottom of the [place]. 12/02/17; use endoform and left the dressing on since last visit. There is no tenderness and no evidence of infection. 12/16/17; patient has been using Endoform but not making much progress. The 2 punched out open areas anteriorly which were the reminiscence of her major wound appear deeper. The area on the lateral aspect of her right calf also appears deeper. Also she has a puzzling tender swelling above her wound on the right leg. This seems larger than last time. Just above her wounds there appears to be some fluctuance in this area it is not erythematous and there is no crepitus 12/30/17; patient has been using Endoform up until last week we used Hydrofera Blue. Ultrasound of the swelling above her 2 major wounds last time was negative for a fluid collection. I gave her cefaclor for the erythema and tenderness in this area which seems better. Unfortunately both punched out areas anteriorly and the area on her right medial lower leg appear deeper. In fact the lateral of the wounds anteriorly  actually looks as though it has exposed tendon and/or muscle sheath. She is not systemically unwell. She is complaining of vaginitis type symptoms presumably Candida from her antibiotics. 01/06/18; we're using santyl. she has 2 punched out areas anteriorly which were initially part of a large wound. Unfortunately medially this is now open to tendon/muscle. All the  wounds have the same adherent very difficult to debride surface. 01/20/18; 2 week follow-up using Santyl. She has the 2 punched out areas anteriorly which were initially part of her large surface wound there. Medially this still has exposed muscle. All of these have the same tightly adherent necrotic surface which requires debridement. PuraPly was not accepted by the patient's insurance however her insurance I think it changed therefore we are going to run Apligraf to gain 02/03/18; the patient has been using Santyl. The wound on the right lateral ankle looks improved and the 2 areas anteriorly on the right leg looks about the same. The medial one has exposed muscle. The lateral 1 requiresdebridement. We use PuraPly today for the first week 02/10/18; PuraPly #2. The patient has 3 wounds. The area on the right lateral ankle, 2 areas anteriorly that were part of her original large wound in this area the medial one has exposed muscle. All of the wounds were lightly debrided with a number 3 curet. PuraPly #2 applied the lateral wound on the calf and the right lateral ankle look better. 02/17/18; PuraPly #3. Patient has 3 wounds. The area on the right lateral ankle in 2 areas internally that were part of her original large wound. The lateral area has exposed muscle. She arrived with some complaints of pain around the right ankle. 02/24/18; PuraPly #4; not much change in any of the 3 wound areas. Right lateral ankle, right lateral calf. Both of these required debridement with a #3 curet. She tolerates this marginally. The area on the medial leg still has  exposed muscle. Not much change in dimensions 03/03/18 PuraPly #5. The area on the medial ankle actually looks better however the 2 separate areas that were original parts of the larger right anterior leg wound look as though they're attempting to coalesce. 03/10/18; PuraPly #6. The area on the medial ankle actually continues to look and measures smaller however the 2 separate areas that were part of the original large wound on the right anterior leg have now coalesced. There hasn't been much improvement here. The lateral area actually has underlying exposed muscle 03/17/18-she is here in follow-up evaluation for ulcerations to the right lower extremity. She is voicing no complaints or concerns. She tolerated debridement. Puraply#7 placed 03/24/18; difficult right lower extremity ulcerations. PuraPly #8 place. She is been approved for [employer]. She did very well with Apligraf today however she is apparently reached her "lifetime max" 03/31/18; marginal improvement with PuraPly although her wounds looked as good as they have in several weeks today. Used TheraSkins #1 04/14/18 TheraSkin #2 today 04/28/18 TheraSkins #3. Wound slightly improved 05/12/18; TheraSkin skin #4. Wound response has been variable. 05/27/18 TheraSkin #5. Generally improvement in all wound areas. I've also put her in 3 layer compression to help with the severe venous hypertension 06/09/18; patient is done quite well with the TheraSkins unfortunately we have no further applications. I also put her in 3 layer compression last week and that really seems to of helped. 06/16/18; we have been using silver collagen. Wounds are smaller. Still be open area to the muscle layer of her calf however even that is contracted somewhat. She tells me that at night sometimes she has pain on the right lateral calf at the site of her lower wound. Notable that I put her into 3 layer compression about 3 weeks ago. She states that she dangles her leg over the bed  that makes it feel better but she does not describe claudication during  the day She is going to call her secondary insurance to see if they will continue to cover advanced treatment products I have reviewed her arterial studies from 01/22/17; this showed an ABI in the right of 1 and on the left noncompressible. TBI on the right at 0.30 on the left at 0.34. It is therefore possible she has significant PAD with medial calcification falsely elevating her ABI into the normal range. I'll need to be careful about asking her about this next week it's possible the 3 layer compression is too much 06/23/18; was able to reapply TheraSkin 1 today. Edema control is good and she is not complaining of pain no claudication 07/07/18;no major change. New wound which was apparently a taper removal injury today in our clinic between her 2 wounds on the right calf TheraSkins #2 07/14/18; I think there is some improvement in the right lateral ankle and the medial part of her wound. There is still exposed muscle medially. 07/28/18; two-week follow-up. TheraSkins#3. Unfortunately no major change. She is not a candidate I don't think for skin grafting due to severe venous hypertension associated with her scleroderma and pulmonary hypertension 08/11/18 Patient is here today for her [employer]ask[employer] application #15 (#5 of the second set). She seems to be doing well and in the base of the wound appears to show some progress at this point. This is the last approved Theraskin of the second set. 08/25/18; she has completed TheraSkin. There has been some improvement on the right lateral calf wound as well as the anterior leg wounds. The open area to muscle medially on the anterior leg wound is smaller. I'm going to transition her back to [facility] under Kerlix Coban change every second day. She reports that she had some calcification removed from her right upper arm. We have had previous problems with calcifications in her wounds on her  legs but that has not happened recently 09/08/18;using Hydrofera Blue on both her wound areas. Wounds seem to of contracted nicely. She uses Kerlix Coban wrap and changes at home herself 09/22/2018; using Hydrofera Blue on both her wound areas. Dimensions seem to have come down somewhat. There is certainly less depth in the medial part of the mid tibia wound and I do not think there is any exposed muscle at this point. 10/06/2018; 2-week follow-up. Using [facility] on her wound areas. Dimensions have come down nicely both on the right lateral ankle area in the right mid tibial area. She has no new complaints 10/20/2018; 2-week follow-up. She is using Hydrofera Blue. Not much change from the last time she was here. The area on the lateral ankle has less depth although it has raised edges on one side. I attempted to remove as much of the raised edge as I could without creating more additional wounds. The area on the right anterior mid tibia area looks the same. 11/03/2018; 2-week follow-up she is using Hydrofera Blue. On the right anterior leg she now has 2 wounds separated by a large area of normal skin. The area on the medial part still has I think exposed muscle although this area itself is a lot smaller. The area laterally has some depth. Both areas with necrotic debris. The area on her right lateral ankle has come in nicely 11/17/2018; patient continues to use Hydrofera Blue. We have been increasing separation of the 2 wounds anteriorly which were at one point joint the area on the right lateral calf continues to have I think some improvement in depth.  12/08/2018; patient continues to use Hydrofera Blue. There is some improvement in the area on the right lateral calf. The 2 areas that were initially part of the original large wound in the mid right tibia are probably about the same. In fact the medial area is probably somewhat larger. We will run puraply through the patient's insurance 12/22/2018;  she has been using Hydrofera Blue. We have small wounds on the right lateral calf and 2 small areas that were initially part of a large wound in the right mid tibia. We applied pure apply #1 today. 12/29/2018; we applied puraply #2. Her wounds look somewhat better especially on the right lateral calf and the lateral part of her original wound in the mid tibial area. 01/05/2019; perhaps slightly improved in terms of wound bed condition but certainly not as much improvement as I might of liked. Puraply #3 1/13: we did not have a correct sized puraply to apply. wounds more pinched out looking, I increased her compression to 3 layer last week to help with significant multilevel venous hypertension. Since then I've reviewed her arterial status. She has a right femoral endarterectomy and a distal left SFA stent. She was being followed by Dr. [name] for a period however she does not appear to have seen him in 3 years. I will set up an appointment. 1/20. The patient has an additional wound on the right lateral calf between the distal wound and proximal wounds. We did not have Puraply last week. Still does not have a follow-up with Dr. early 1/27: Follow-up with Dr. early has been arranged apparently with follow-up noninvasive studies. Wounds are measuring roughly the same although they certainly look smaller 2/3; the patient had non invasive studies. Her ABIs on the right were 0.83 and on the left 1.02 however there was no great toe pressure bilaterally. Also worrisome monophasic waveforms at the PTA and dorsalis pedis. We are still using Puraply. We have had some improvement in all of the wounds especially the lateral part of the mid tibial area. 2/10; sees Dr. early of vein and vascular re-arterial studies next week. Puraply reapplied today. 2/17; Dr. early of vein and vascular his appointment is tomorrow. Puraply reapplied after debridement of all wounds 2/24; the patient saw Dr. early I reviewed his note.  sHe noted the previous right femoral endarterectomy with a Dacron patch. He also noted the normal ABI and the monophasic waveforms suggesting tibial disease. Overall he did not feel that she had any evidence of arterial insufficiency that would impair her wound healing. He did note her venous disease as well. He suggested PRN follow-up. 3/2; I had the last puraply applied today. The original wounds over the mid tibia area are improved where is the area on the right lower leg is not 3/9; wounds are smaller especially in the right mid tibia perhaps slightly in the right lateral calf. We finished with puraply and went to endoform today 3/23; the patient arrives after 2 weeks. She has been using endoform. I think all of her wounds look slightly better which includes the area on the right lateral calf just above the right lateral malleolus and the 2 in the right mid tibia which were initially part of the same wound. 4/27; TELEHEALTH visit; the patient was seen for telehealth visit today with her consent in the middle of the worldwide epidemic. Since she was last here she called in for antibiotics with pain and tenderness around the area on the right medial ankle. I gave  her empiric doxycycline. She states this feels better. She is using endoform on both of these areas 5/11 TELEHEALTH; the patient was seen for telehealth visit today. She was accompanied at home by her husband. She has severe pulmonary hypertension accompanied scleroderma and in the face of the Covid epidemic cannot be safely brought into our clinic. We have been using endoform on her wound areas. There are essentially 3 wound areas now in the left mid tibia now 2 open areas that it one point were connected and one on the right lateral ankle just above the malleolus. 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 She cannot come to clinic. The wounds were reviewed today via telehealth. She has severe chronic venous hypertension which I think is centrally mediated. She has wounds on her right anterior tibia and right lateral ankle area. These are chronic. She has been using endoform. 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 We have been following her from telehealth. She has severe chronic venous hypertension which may be mostly centrally mediated secondary to right heart heart failure. She has wounds on her right anterior tibia and right lateral ankle these are chronic we have been using endoform 6/22; TELEHEALTH; this patient was seen today via telehealth. 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 Since the last time we had contact with this patient she developed some pain and erythema around the wound on her right lateral malleolus/ankle and we put in antibiotics for her. This is resulted in good improvement with resolution of the erythema and tenderness. I changed her to silver alginate last time. We had been using endoform for an extended period of time 7/13; TELEHEALTH; this patient was seen today via telehealth. 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 She would continue to be at a prohibitive risk to be brought into our clinic unless this was absolutely necessary. These visits have been done with her approval as well as her husband. We have been using silver alginate to the areas on the right mid tibia and right lateral lower leg. 7/27 TELEHEALTH; patient was seen along with her husband today via telehealth. 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 We changed her to sample last visit. She has 2 areas a chronic wound on her right mid tibia and one just above her ankle. These were not the original wounds when she came into this clinic but she developed them during treatment 8/17; she comes in for her first face-to-face visit in a long period. She has a remaining area just medial to the right tibia which is the last open part of her large wound across this area. She also has an area on the right lateral lower leg. We prescribed Santyl last telehealth visit but they were concerned that this was making a deeper so they put silver alginate on it last week. Her husband changes the dressings. 8/31; using Santyl to the 2 wound areas some improvement in wound surfaces. Husband has surgery in 2 weeks we will put her out  3 weeks. Any of the advanced treatment options that I can think of that would be eligible for this wound would also cause her to have to come in weekly. The risk that the patient is just too high 9/21. Using Santyl to the 2 wound areas. Both of these are somewhat better although the medial mid tibia area still has exposed muscle. Lateral ankle requiring debridement. Using Santyl 10/12; using Santyl to the 2 wound areas. One on the right lateral ankle and the other in the medial calf which still has exposed muscle. Both areas have come down in size and have better looking surfaces. She has made nice progress with santyl 11/2; we have been using Santyl to the 2 wound areas. Right lateral ankle and the other in the mid tibia area the medial part of this still has exposed muscle. 11/23/2019 on evaluation today patient appears to be doing about the same really with regard to her wounds. She is actually not very pleased with how things seem to be progressing at this point she tells me that she really has not noted much improvement unfortunately. With that being said there is no signs of active infection at  this time. There is some slough buildup noted at this time which again along with some dry skin around the edges of the wound I think would benefit her to try to debride some of this away. Fortunately her pain is doing fairly well. She still has exposed muscle in the right medial/tibial area. 12/14; TELEHEALTH; she was changed to [facility] to the right calf wound and right lateral ankle wound when she was here last time. Unfortunately since then she had a fall with a pelvic fracture and a fracture of her wrist. She was apparently hospitalized for 5 days. I have not looked at her discharge summary. She apparently came out of the hospital with a blister on her right heel. She was seen today via telehealth by myself and our case manager. The patient and her husband were present. She has been using Hydrofera Blue at our direction from the last time she was in the clinic. There is been no major improvement in fact the areas appear deeper and with a less viable surface 01/04/2020; TELEHEALTH; the patient was seen today in accompaniment of her husband and our nurse. She has 3 open areas 1 on the right medial mid tibia, one on the right lateral ankle and a large eschared pressure area on the right heel. We have been using Iodoflex to the 2 original wounds. 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 It is simply too perilous for her to be seen in any other way 1/26; TELEHEALTH; the patient was seen today in accompaniment of her husband and our RN one of our nurses. She still has the 3 open areas 1 on the right medial mid tibia which is the remanent of a more extensive wound in this area, one on the right lateral ankle and a large eschared pressure area on the right heel. We have been using Iodoflex to the 2 original wounds and a bed at 9 application to the eschared area on the heel 2/15; the first time we have seen this patient and then several months out of concern for the  pandemic. She had a large horizontal wound in the mid tibia. Only the medial aspect of this is still open. Area on the lateral ankle is just about closed. She had a new pressure ulcer on the  right heel which I have removed some of the eschar. We have been using Iodoflex which I will continue. The area in the mid tibia has a round circle in the middle of exposed muscle. I think we would have to use an advanced treatment product to stimulate granulation over this area. We will run this through her insurance. She is not eligible for plastic surgery for many reasons 3/1; TELEHEALTH. The patient was seen today by telehealth. 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 We have been using Iodoflex to the wound areas which include the right anterior mid tibia, right lateral ankle and the right heel. All of these were reviewed 3/8; the patient's wound just above the right ankle is closed. She still has a contracting black eschar on the heel where she had a pressure ulcer. The medial part of her original wound on the mid tibia has exposed muscle. We have made really made no progress in this area although we have managed to get a lot of the original wound in this area to close. We used Apligraf #1 today 3/22; the patient's ankle wound remains closed. She still has a contracting black eschar on the heel although it seems to have less surface area. It still not clear whether there is any depth here. We used Apligraf #2 today in an attempt to get granulation over the exposed muscle and what is left of her mid tibia area. 4/5; everything is closed except the medial aspect of the mid tibia wound, as well as the pressure injury on the right heel. She has been using Betadine to the right heel which has been gradually contracting. We used Apligraf #3 today. 4/19; Apligraf #4. Still a pressure area on the right heel she has  been using Betadine superficial excoriated areas she has been applying salicylic acid to on the anterior leg and the thick horn on the leg just above her wound area 5/3; Apligraf #5. Unfortunately she came in today with a reopening of the area on the right lateral lower leg. Not much change in the wound we have been treating in the mid calf. Quite a bit of edema surrounding this wound. She reports that she was up on her feet quite a bit. The area on the right heel is separating she has been using Betadine She comes in with a new wound on the left lateral malleolus which is very disappointing this is been open since last week 5/17; we applied her last Apligraf last time. Unfortunately that did not have any effect on the deep area medially in the mid tibial area. However the area over the right lateral malleolus is a lot better. Right heel also is contracted we used Iodoflex last time. The new wound on the left lateral malleolus were using Santyl to. This required debridement. 6/1. Not much change in the area on the right medial mid tibia. Right lateral ankle is improved she has the necrotic area on the right heel which was a pressure area. New area on the left lateral malleolus from last time. I changed her to Iodoflex to the area on the left lateral malleolus she said this hurt I have put her back on Santyl 6/15; right mid tibia is unchanged. I do not think there is anything I can do to this topically that we will get this to granulate over the muscle. And I am furthermore I am not sure that  she is a surgical candidate either because of her severe pulmonary issues or because she really does not want to go through it. The area on the right lateral malleolus is a small punched out area. The area on the left lateral malleolus again small punched out with nonviable surface Pressure ulcer on the right heel at separating black eschar. I went ahead and remove this today she has an area on the left anterior  mid tibia just laterally. Geographic wounds debris on the surface. Some erythema here. 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 There is skin changes widely Change the primary dressing to Sorbact to all wound areas. She needs compression on the left leg as well as the right 6/21; right mid tibia which was her one remaining wound at 1 point is unchanged The area on the right lateral malleolus actually is close to closing over still a small punched out area The area on the left lateral malleolus again a deep wound it is this 1 that she feels pain in. Pressure ulcer on the right heel was cultured today. New area on the left anterior mid tibia several geographic wounds last week in the setting of chronic venous insufficiency this actually looks some better 7/6; Right mid tibia about the same. Exposed muscle Left lateral malleolus again necrotic debris over the surface. Pressure ulcer on the right heel. She finished the Keflex we prescribed. Necrotic debris debrided with a #3 curette The rest of her wounds on the right mid tibia right lateral ankle are better Her husband reminds me that she had a right femoral endarterectomy and has 2 stents in her left thigh placed in 2011 approximately by vascular surgery in [place]. She saw Dr. [name] last in February 2020. At that point he did not think that the arterial insufficiency she had on the right was sufficient to explain any of her right lower extremity wounds however she now has an area on the left lateral malleolus that looks like an ischemic wound 7/12; we have been using Sorbact all wound areas Right mid tibia about the same exposed muscle Left lateral malleolus small wound with debris in the surface punched out Pressure ulcer of the right heel requiring debridement of necrotic debris Lateral ankle wound on the right is just about closed Right mid tibia wound about the same still with exposed muscle. 7/26 really no change  in any wounds. We have been using Sorbact. She has bilateral lower extremity wounds. This is in the setting of scleroderma, severe venous hypertension. She also has known PAD and I had really hoped to be able to get a review by Dr. [name] but apparently that is not booked until August 31. I have asked her to call back to vein and vascular and get an appointment with somebody a little earlier if that is possible. 8/16; patient's area on the right ankle which was a chronic wound is closed over again. The area on the right mid lower leg, right heel left lateral ankle are all about the same. Pressure ulcer on the right heel is still not viable. New areas identified on the right lateral calf. She has a follow-up with Dr. [name] on 8/31. Phenomenally I would like him to go over her arterial status with regards to her underlying stents. 9/13; patient had her ARTERIAL studies done however apparently Dr. [name] is now in [place] so she did not get an appointment with him on 8/31 and was not referred to any other provider.  On the right side her PTA was monophasic. Noncompressible. TBI on the right at 0.41 on the left she was monophasic and noncompressible they did not do a TBI because she had a Band-Aid on her big toe. Patient does not describe claudication although she is having a lot of pain in the left lateral malleolus. 10/11; the patient had a right-sided interventions on 10/5. She had a angioplasty of the right anterior tibial artery as well as the tibioperoneal trunk/peroneal artery she had a drug-coated balloon angioplasty of the right superficial femoral artery. On the left she did not have any interventions yet but is going to have another look at this in 2 weeks. The superficial femoral artery on the left and the associated stent are patent popliteal artery is patent to the dominant vessel runoff is the anterior tibial which has several high-grade stenosis in the proximal one third. We have been using  Sorbact. She also has a new area on the right upper mid tibia. 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 She tolerated this well. She is using Iodoflex and her husband is doing a kerlix Coban on her. She arrives in clinic today with the original medial part of her mid tibia wound with muscle exposure small area on the right ankle which was part of her original wound she has 2 punched-out areas laterally and 1 above her area anteriorly. Nonviable surfaces similarly the right heel is nonviable. On the left side she has an area on the left foot and the left lateral ankle similar nonviable surfaces. 11/8; patient arrives in clinic today with nothing improved. She has a multiple lightly group of wounds on the right lower leg presumably different etiologies including a skin biopsy, the original wound she had medially and some punched-out areas that are not of clear defined etiology. She says she was in a lot of pain this week could not have her leg up on the bed did not get completely relief from dropping it over the bed. She also has a small area on the left lateral malleolus and the left lateral foot necrotic surfaces on all of these wounds. We have been using Iodoflex I attempted to put her in 3 layer compression last week that did not go well. We will be backing off on kerlix Coban this week. 11/15; the ultrasound that I sent the patient for last week showed findings consistent with acute deep vein thrombosis involving the right distal femoral vein and right popliteal vein. I was concerned about the combination of Plavix and Xarelto however apparently Dr. [name] felt that was not a problem. She is now going through the acute dosing of Xarelto. She has  an appointment with Dr. Trula Slade on 12/5. 11/29; 10 days ago before he left for weeks vacation she called to report pain in the right leg I sent her in some doxycycline her husband thought things were getting better however she required admission to hospital from 11/22 through 11/24 prompted by finding that her hemoglobin was 5.7 she was transfused 2 units of packed cells and her hemoglobin stabilized. She did undergo an endoscopy with findings of significant esophagitis part of which was candidal and part of it reflux. She was discharged on nystatin swish and swallow which she is doing.  She seems to be better from that regard. There was no active other active source of bleeding Unfortunately the combination of Xarelto and Plavix was felt to be too much for her. The Xarelto was stopped and an IVC filter was placed she is continued on Plavix. Her big complaint today is pain in the right calf really from the mid aspect superiorly. This is very tender slightly red but not particularly warm. She did see vein and vascular in the hospital I will see if I can pull their note 12/6; patient was at Dr. Stephens Shire office today. Great toe pressure on the right is 54 on the left 49 she has a 50 to 74% stenosis of the right mid popliteal on the left at 30 to 49% proximal SFA and a 50 to 74% CFA. She was treated with angioplasty of the right ATA, right TP trunk and peroneal artery as well as an angioplasty of the right SFA to popliteal artery. On 10/26 she underwent angioplasty of the left ATA and left TP trunk she has a history of a left common femoral endarterectomy and left superficial femoral artery stent as well as a right superficial femoral artery stent. In addition to her arterial disease she has significant venous disease with a history of a right greater saphenous vein ablation and a recent DVT in the right femoral and popliteal vein with insertion of an IVC filter. She has remained on Plavix but her  anticoagulant was stopped. She continues to have a lot of swelling and pain in the right leg with significant erythema which I am assuming is stasis dermatitis. She has had substantial wound areas on the right leg including right lateral a large necrotic wound with a smaller biopsy site above this. She has original wound on the left mid tibia with a small area above this as well. On the left she has a small area over the left lateral malleolus and the left lateral foot 12/22-week follow-up. Her right leg looks really terrible. She has another necrotic wound just above her original one on the right posterior leg. Greenish drainage suggestive of gram-negative/Pseudomonas infection. The entire mid part of her leg is erythematous I think most of this is stasis dermatitis rather than cellulitis. She has also a bothersome ischemic-looking right fourth toe which I noticed today without her really complaining. She has not been systemically unwell although she looks more physically frail 12/27; I reached out to vein and vascular last weekend Dr. [name] will be seeing her on Thursday. She has a gangrenous area on the tip of the right fourth toe. Multiple areas breaking down. I think this is a combination of arterial and severe venous insufficiency she has multiple wounds in the right leg but not a lot of pain. She has 3 small areas on the left leg we have been using Santyl. On the right leg we have been using silver alginate or at least that is what they have been using Culture I did last week showed abundant is Pseudomonas aeruginosa had a few Enterococcus faecalis. I have been using Ceftin here predominantly because of the green drainage suggesting Pseudomonas I have not yet addressed the Enterococcus which was ampicillin sensitive 1/7; the patient was kindly seen by vein and vascular. She underwent a repeat ANGIOGRAM on 01/02/2021 by Dr. Donzetta Matters. She appears to have had bilateral common femoral treatments in the  past. The right side is heavily heavily calcified and she has 1 area of 50% stenosis in the SFA that  is nonflow limiting. She has dominant runoff via the anterior tibial and peroneal arteries. On the left side she also has a heavily calcified SFA no flow-limiting stenosis in the popliteal artery then she she then has runoff via the anterior tibial and posterior tibial arteries without flow-limiting stenosis. She was not felt to require any intervention. She continues to have pain in the right leg with erythema although the erythema is somewhat better. Green drainage coming from the wounds on the posterior lateral right leg which are clearly necrotic. I have had her on 2-1/2 weeks of cefdinir this is while we awaited further arterial evaluation which is completed. I have now turned some thought to the status of her venous system particularly the DVT she had in November. This was an acute DVT involving the right distal femoral and right popliteal veins. She was then started on Xarelto in combination with Plavix but she required hospitalization in November with GI bleeding. She had an endoscopy by Dr. Lizbeth Bark of GI. This showed moderately severe esophagitis a combination of both food particles reflux and Candida. Localized mild inflammation characterized by erythema was found in the cardiac gastric fundus and the gastric body. The duodenal bulb's in the stages of the duodenum were normal. She then underwent a capsule endoscopy which showed evidence of oozing blood from the esophagus but no evidence of small bowel bleeding. She was not restarted on her Xarelto, she continues on Plavix and she has an IVC filter 01/12/2021; we brought this patient in for a nurse visit today however I saw her because of copious amounts of green drainage. Under 3 layer compression the degree of erythema in her lower leg on the right was better however there is indeed large amounts of green drainage I think coming out of the  large wound on the posterior lateral leg. There is also a satellite area above this. She complains of fairly constant pain in fact she called our clinic earlier in the week to report that Sorbact which we are using topically was hurting her. We explained that we had never heard Sorbact do this. She has not been systemically unwell. My thoughts are on this lady that she had recent angiograms and there is nothing further that can be done. She has a gangrenous right fourth toe but there should be enough blood flow to heal wounds on her legs. She did not have another intervention. She is on Plavix with an IVC filter for her previous stents. She had an acute DVT in November I believe and was on Xarelto and Plavix but had a GI bleed of I think esophageal source. She has some swelling in the right thigh marked erythema and swelling in the right mid calf. I wonder whether she is extended the clot burden in her thigh as a cause of the distal DVT like symptoms. 1/18; the patient's repeat duplex ultrasound did not show extension of her DVT in fact this had recanalized and was better. At my request she is seeing Dr. Oneida Alar on Thursday to look over the recent angiogram in the ischemic looking areas in her right fourth and today her right second toe. This foot is cold and I wonder whether she either has microvascular disease and/or severe Raynaud's on top of her known macrovascular disease although her recent angiogram did not show anything that needed a particular procedure. The other issue has been copious amounts of drainage coming out of these large necrotic wounds on her right lateral calf. This is  almost a jelly green color and consistency. PCR culture I did last week showed high amounts of Pseudomonas which was hardly surprising but also high amounts of Enterococcus faecalis. She is allergic to penicillin, Levaquin and sulfa. There is not a good oral combination of a single drug that would cover this. I am  going to ask for infectious disease. About the only positive thing I can say about the right leg as she is not in as much pain as she was several weeks ago. I gave her a prolonged course of cefdinir orally and have increased her compression. The leg is certainly less red and less tender but she is beginning develop ischemic changes in her right forefoot Electronic Signature(s) Signed: 01/17/2021 5:23:51 PM By: Linton Ham MD Entered By: Linton Ham on 01/17/2021 12:18:34 -------------------------------------------------------------------------------- Physical Exam Details Patient Name: Date of Service: [name] 01/17/2021 10:45 A M Medical Record Number: [id number] Patient Account Number: [id number] Date of Birth/Sex: Treating RN: 1951/09/08 (70 y.o. Benjaman Lobe Primary Care Provider: [name] Other Clinician: Referring Provider: Treating Provider/Extender: [name] in Treatment: 62 Constitutional Patient is hypertensive.. Pulse regular and within target range for patient.Marland Kitchen Respirations regular, non-labored and within target range.. Temperature is normal and within the target range for the patient.Marland Kitchen Appears in no distress. Cardiovascular I cannot feel pulses on the right foot. Forefoot is cold. Notes Wound exam Right side the copious green drainage noted. Large necrotic wound on the right lateral calf. Innumerable small breaks in the skin anteriorly. She has 2 ischemic areas on the right fourth and right second toes. The right second toe looks is new. She also has medial and anterior wounds on the right leg On the left she has the same areas over the left ankle left lateral foot Electronic Signature(s) Signed: 01/17/2021 5:23:51 PM By: Linton Ham MD Entered By: Linton Ham on 01/17/2021 12:24:44 -------------------------------------------------------------------------------- Physician Orders Details Patient Name: Date  of Service: [name], [name] 01/17/2021 10:45 A M Medical Record Number: [id number] Patient Account Number: [id number] Date of Birth/Sex: Treating RN: 02/17/1951 (70 y.o. Benjaman Lobe Primary Care Provider: [name] Other Clinician: Referring Provider: Treating Provider/Extender: [name] in [phone] Verbal / Phone Orders: No Diagnosis Coding Follow-up Appointments Return Appointment in 1 week. Nurse Visit: - on Thursday Bathing/ Shower/ Hygiene May shower and wash wound with soap and water. - on days that dressing is changed Edema Control - Lymphedema / SCD / Other Bilateral Lower Extremities Elevate legs to the level of the heart or above for 30 minutes daily and/or when sitting, a frequency of: - throughout the day Avoid standing for long periods of time. Exercise regularly Additional Orders / Instructions Other: - Refer to Dr. Graylon Good (ID) today Other: - Send PCR culture to [facility] Wound Treatment Wound #16 - Calcaneus Wound Laterality: Right Cleanser: Soap and Water Every Other Day/30 Days Discharge Instructions: May shower and wash wound with dial antibacterial soap and water prior to dressing change. Peri-Wound Care: Sween Lotion (Moisturizing lotion) Every Other Day/30 Days Discharge Instructions: Apply moisturizing lotion as directed Topical: Gentamicin Every Other Day/30 Days Discharge Instructions: As directed by physician Topical: Keystone antibiotic Every Other Day/30 Days Discharge Instructions: Once received, stop the gentamicin and Apply Keystone under the calcium alginate with silver. Prim Dressing: KerraCel Ag Gelling Fiber Dressing, 4x5 in (silver alginate) (DME) (Generic) Every Other Day/30 Days ary Discharge Instructions: Apply silver alginate to  wound bed as instructed Secondary Dressing: Woven Gauze Sponge, Non-Sterile 4x4 in (DME) (Generic) Every Other Day/30 Days Discharge Instructions: Apply over primary  dressing as directed. Secondary Dressing: ABD Pad, 8x10 (DME) Every Other Day/30 Days Discharge Instructions: Apply over primary dressing as directed. Compression Wrap: Kerlix Roll 4.5x3.1 (in/yd) (DME) (Generic) Every Other Day/30 Days Discharge Instructions: Apply Kerlix and Coban compression as directed. Compression Wrap: Coban Self-Adherent Wrap 4x5 (in/yd) (DME) (Generic) Every Other Day/30 Days Discharge Instructions: Apply over Kerlix as directed. Wound #19 - Malleolus Wound Laterality: Right, Lateral Cleanser: Soap and Water Every Other Day/30 Days Discharge Instructions: May shower and wash wound with dial antibacterial soap and water prior to dressing change. Peri-Wound Care: Sween Lotion (Moisturizing lotion) Every Other Day/30 Days Discharge Instructions: Apply moisturizing lotion as directed Topical: Gentamicin Every Other Day/30 Days Discharge Instructions: As directed by physician Topical: Keystone antibiotic Every Other Day/30 Days Discharge Instructions: Once received, stop the gentamicin and Apply Keystone under the calcium alginate with silver. Prim Dressing: KerraCel Ag Gelling Fiber Dressing, 4x5 in (silver alginate) (DME) (Generic) Every Other Day/30 Days ary Discharge Instructions: Apply silver alginate to wound bed as instructed Secondary Dressing: Woven Gauze Sponge, Non-Sterile 4x4 in (DME) (Generic) Every Other Day/30 Days Discharge Instructions: Apply over primary dressing as directed. Secondary Dressing: ABD Pad, 8x10 (DME) Every Other Day/30 Days Discharge Instructions: Apply over primary dressing as directed. Compression Wrap: Kerlix Roll 4.5x3.1 (in/yd) (DME) (Generic) Every Other Day/30 Days Discharge Instructions: Apply Kerlix and Coban compression as directed. Compression Wrap: Coban Self-Adherent Wrap 4x5 (in/yd) (DME) (Generic) Every Other Day/30 Days Discharge Instructions: Apply over Kerlix as directed. Wound #20 - Malleolus Wound Laterality: Left,  Lateral Cleanser: Soap and Water 1 x Per Week/7 Days Discharge Instructions: May shower and wash wound with dial antibacterial soap and water prior to dressing change. Peri-Wound Care: Sween Lotion (Moisturizing lotion) 1 x Per Week/7 Days Discharge Instructions: Apply moisturizing lotion as directed Prim Dressing: Cutimed Sorbact Swab (DME) (Generic) 1 x Per Week/7 Days ary Discharge Instructions: Apply to wound bed as instructed Secondary Dressing: Woven Gauze Sponge, Non-Sterile 4x4 in (DME) (Generic) 1 x Per Week/7 Days Discharge Instructions: Apply over primary dressing as directed. Secondary Dressing: ABD Pad, 8x10 (DME) 1 x Per Week/7 Days Discharge Instructions: Apply over primary dressing as directed. Compression Wrap: Kerlix Roll 4.5x3.1 (in/yd) (DME) (Generic) 1 x Per Week/7 Days Discharge Instructions: Apply Kerlix and Coban compression as directed. Compression Wrap: Coban Self-Adherent Wrap 4x5 (in/yd) (DME) (Generic) 1 x Per Week/7 Days Discharge Instructions: Apply over Kerlix as directed. Wound #23 - Lower Leg Wound Laterality: Right, Posterior Cleanser: Soap and Water Every Other Day/30 Days Discharge Instructions: May shower and wash wound with dial antibacterial soap and water prior to dressing change. Peri-Wound Care: Sween Lotion (Moisturizing lotion) Every Other Day/30 Days Discharge Instructions: Apply moisturizing lotion as directed Topical: Gentamicin Every Other Day/30 Days Discharge Instructions: As directed by physician Topical: Keystone antibiotic Every Other Day/30 Days Discharge Instructions: Once received, stop the gentamicin and Apply Keystone under the calcium alginate with silver. Prim Dressing: KerraCel Ag Gelling Fiber Dressing, 4x5 in (silver alginate) (DME) (Generic) Every Other Day/30 Days ary Discharge Instructions: Apply silver alginate to wound bed as instructed Secondary Dressing: Woven Gauze Sponge, Non-Sterile 4x4 in (DME) (Generic) Every  Other Day/30 Days Discharge Instructions: Apply over primary dressing as directed. Secondary Dressing: ABD Pad, 8x10 (DME) Every Other Day/30 Days Discharge Instructions: Apply over primary dressing as directed. Compression Wrap: Kerlix Roll 4.5x3.1 (in/yd) (  DME) (Generic) Every Other Day/30 Days Discharge Instructions: Apply Kerlix and Coban compression as directed. Compression Wrap: Coban Self-Adherent Wrap 4x5 (in/yd) (DME) (Generic) Every Other Day/30 Days Discharge Instructions: Apply over Kerlix as directed. Wound #24 - Lower Leg Wound Laterality: Right, Anterior Cleanser: Soap and Water Every Other Day/30 Days Discharge Instructions: May shower and wash wound with dial antibacterial soap and water prior to dressing change. Peri-Wound Care: Sween Lotion (Moisturizing lotion) Every Other Day/30 Days Discharge Instructions: Apply moisturizing lotion as directed Topical: Gentamicin Every Other Day/30 Days Discharge Instructions: As directed by physician Topical: Keystone antibiotic Every Other Day/30 Days Discharge Instructions: Once received, stop the gentamicin and Apply Keystone under the calcium alginate with silver. Prim Dressing: KerraCel Ag Gelling Fiber Dressing, 4x5 in (silver alginate) (DME) (Generic) Every Other Day/30 Days ary Discharge Instructions: Apply silver alginate to wound bed as instructed Secondary Dressing: Woven Gauze Sponge, Non-Sterile 4x4 in (DME) (Generic) Every Other Day/30 Days Discharge Instructions: Apply over primary dressing as directed. Secondary Dressing: ABD Pad, 8x10 (DME) Every Other Day/30 Days Discharge Instructions: Apply over primary dressing as directed. Compression Wrap: Kerlix Roll 4.5x3.1 (in/yd) (DME) (Generic) Every Other Day/30 Days Discharge Instructions: Apply Kerlix and Coban compression as directed. Compression Wrap: Coban Self-Adherent Wrap 4x5 (in/yd) (DME) (Generic) Every Other Day/30 Days Discharge Instructions: Apply over Kerlix  as directed. Wound #25 - Foot Wound Laterality: Left, Lateral Cleanser: Soap and Water 1 x Per Week/7 Days Discharge Instructions: May shower and wash wound with dial antibacterial soap and water prior to dressing change. Peri-Wound Care: Sween Lotion (Moisturizing lotion) 1 x Per Week/7 Days Discharge Instructions: Apply moisturizing lotion as directed Prim Dressing: Cutimed Sorbact Swab (DME) (Generic) 1 x Per Week/7 Days ary Discharge Instructions: Apply to wound bed as instructed Secondary Dressing: Woven Gauze Sponge, Non-Sterile 4x4 in (DME) (Generic) 1 x Per Week/7 Days Discharge Instructions: Apply over primary dressing as directed. Secondary Dressing: ABD Pad, 8x10 (DME) 1 x Per Week/7 Days Discharge Instructions: Apply over primary dressing as directed. Compression Wrap: Kerlix Roll 4.5x3.1 (in/yd) (DME) (Generic) 1 x Per Week/7 Days Discharge Instructions: Apply Kerlix and Coban compression as directed. Compression Wrap: Coban Self-Adherent Wrap 4x5 (in/yd) (DME) (Generic) 1 x Per Week/7 Days Discharge Instructions: Apply over Kerlix as directed. Wound #26 - Lower Leg Wound Laterality: Right, Lateral Cleanser: Soap and Water Every Other Day/30 Days Discharge Instructions: May shower and wash wound with dial antibacterial soap and water prior to dressing change. Peri-Wound Care: Sween Lotion (Moisturizing lotion) Every Other Day/30 Days Discharge Instructions: Apply moisturizing lotion as directed Topical: Gentamicin Every Other Day/30 Days Discharge Instructions: As directed by physician Topical: Keystone antibiotic Every Other Day/30 Days Discharge Instructions: Once received, stop the gentamicin and Apply Keystone under the calcium alginate with silver. Prim Dressing: KerraCel Ag Gelling Fiber Dressing, 4x5 in (silver alginate) (DME) (Generic) Every Other Day/30 Days ary Discharge Instructions: Apply silver alginate to wound bed as instructed Secondary Dressing: Woven Gauze  Sponge, Non-Sterile 4x4 in (DME) (Generic) Every Other Day/30 Days Discharge Instructions: Apply over primary dressing as directed. Secondary Dressing: ABD Pad, 8x10 (DME) Every Other Day/30 Days Discharge Instructions: Apply over primary dressing as directed. Compression Wrap: Kerlix Roll 4.5x3.1 (in/yd) (DME) (Generic) Every Other Day/30 Days Discharge Instructions: Apply Kerlix and Coban compression as directed. Compression Wrap: Coban Self-Adherent Wrap 4x5 (in/yd) (DME) (Generic) Every Other Day/30 Days Discharge Instructions: Apply over Kerlix as directed. Wound #27 - Lower Leg Wound Laterality: Right, Anterior, Distal Cleanser: Soap and Water Every  Other Day/30 Days Discharge Instructions: May shower and wash wound with dial antibacterial soap and water prior to dressing change. Peri-Wound Care: Sween Lotion (Moisturizing lotion) Every Other Day/30 Days Discharge Instructions: Apply moisturizing lotion as directed Topical: Gentamicin Every Other Day/30 Days Discharge Instructions: As directed by physician Topical: Keystone antibiotic Every Other Day/30 Days Discharge Instructions: Once received, stop the gentamicin and Apply Keystone under the calcium alginate with silver. Prim Dressing: KerraCel Ag Gelling Fiber Dressing, 4x5 in (silver alginate) (DME) (Generic) Every Other Day/30 Days ary Discharge Instructions: Apply silver alginate to wound bed as instructed Secondary Dressing: Woven Gauze Sponge, Non-Sterile 4x4 in (DME) (Generic) Every Other Day/30 Days Discharge Instructions: Apply over primary dressing as directed. Secondary Dressing: ABD Pad, 8x10 (DME) Every Other Day/30 Days Discharge Instructions: Apply over primary dressing as directed. Compression Wrap: Kerlix Roll 4.5x3.1 (in/yd) (DME) (Generic) Every Other Day/30 Days Discharge Instructions: Apply Kerlix and Coban compression as directed. Compression Wrap: Coban Self-Adherent Wrap 4x5 (in/yd) (DME) (Generic) Every  Other Day/30 Days Discharge Instructions: Apply over Kerlix as directed. Wound #29 - Lower Leg Wound Laterality: Right, Lateral, Proximal Cleanser: Soap and Water Every Other Day/30 Days Discharge Instructions: May shower and wash wound with dial antibacterial soap and water prior to dressing change. Peri-Wound Care: Sween Lotion (Moisturizing lotion) Every Other Day/30 Days Discharge Instructions: Apply moisturizing lotion as directed Topical: Gentamicin Every Other Day/30 Days Discharge Instructions: As directed by physician Topical: Keystone antibiotic Every Other Day/30 Days Discharge Instructions: Once received, stop the gentamicin and Apply Keystone under the calcium alginate with silver. Prim Dressing: KerraCel Ag Gelling Fiber Dressing, 4x5 in (silver alginate) (DME) (Generic) Every Other Day/30 Days ary Discharge Instructions: Apply silver alginate to wound bed as instructed Secondary Dressing: Woven Gauze Sponge, Non-Sterile 4x4 in (DME) (Generic) Every Other Day/30 Days Discharge Instructions: Apply over primary dressing as directed. Secondary Dressing: ABD Pad, 8x10 (DME) Every Other Day/30 Days Discharge Instructions: Apply over primary dressing as directed. Compression Wrap: Kerlix Roll 4.5x3.1 (in/yd) (DME) (Generic) Every Other Day/30 Days Discharge Instructions: Apply Kerlix and Coban compression as directed. Compression Wrap: Coban Self-Adherent Wrap 4x5 (in/yd) (DME) (Generic) Every Other Day/30 Days Discharge Instructions: Apply over Kerlix as directed. Wound #30 - T Fourth oe Wound Laterality: Right Cleanser: Soap and Water Every Other Day/30 Days Discharge Instructions: May shower and wash wound with dial antibacterial soap and water prior to dressing change. Peri-Wound Care: Sween Lotion (Moisturizing lotion) Every Other Day/30 Days Discharge Instructions: Apply moisturizing lotion as directed Topical: Gentamicin Every Other Day/30 Days Discharge Instructions: As  directed by physician Topical: Keystone antibiotic Every Other Day/30 Days Discharge Instructions: Once received, stop the gentamicin and Apply Keystone under the calcium alginate with silver. Prim Dressing: KerraCel Ag Gelling Fiber Dressing, 4x5 in (silver alginate) (DME) (Generic) Every Other Day/30 Days ary Discharge Instructions: Apply silver alginate to wound bed as instructed Secondary Dressing: Woven Gauze Sponge, Non-Sterile 4x4 in (DME) (Generic) Every Other Day/30 Days Discharge Instructions: Apply over primary dressing as directed. Secondary Dressing: ABD Pad, 8x10 (DME) Every Other Day/30 Days Discharge Instructions: Apply over primary dressing as directed. Compression Wrap: Kerlix Roll 4.5x3.1 (in/yd) (DME) (Generic) Every Other Day/30 Days Discharge Instructions: Apply Kerlix and Coban compression as directed. Compression Wrap: Coban Self-Adherent Wrap 4x5 (in/yd) (DME) (Generic) Every Other Day/30 Days Discharge Instructions: Apply over Kerlix as directed. Wound #31 - Ankle Wound Laterality: Left, Medial Cleanser: Soap and Water 1 x Per Week/7 Days Discharge Instructions: May shower and wash wound with dial  antibacterial soap and water prior to dressing change. Peri-Wound Care: Sween Lotion (Moisturizing lotion) 1 x Per Week/7 Days Discharge Instructions: Apply moisturizing lotion as directed Prim Dressing: Cutimed Sorbact Swab (DME) (Generic) 1 x Per Week/7 Days ary Discharge Instructions: Apply to wound bed as instructed Secondary Dressing: Woven Gauze Sponge, Non-Sterile 4x4 in (DME) (Generic) 1 x Per Week/7 Days Discharge Instructions: Apply over primary dressing as directed. Secondary Dressing: ABD Pad, 8x10 (DME) 1 x Per Week/7 Days Discharge Instructions: Apply over primary dressing as directed. Compression Wrap: Kerlix Roll 4.5x3.1 (in/yd) (DME) (Generic) 1 x Per Week/7 Days Discharge Instructions: Apply Kerlix and Coban compression as directed. Compression Wrap:  Coban Self-Adherent Wrap 4x5 (in/yd) (DME) (Generic) 1 x Per Week/7 Days Discharge Instructions: Apply over Kerlix as directed. Wound #32 - T Great oe Wound Laterality: Right Cleanser: Soap and Water Every Other Day/30 Days Discharge Instructions: May shower and wash wound with dial antibacterial soap and water prior to dressing change. Peri-Wound Care: Sween Lotion (Moisturizing lotion) Every Other Day/30 Days Discharge Instructions: Apply moisturizing lotion as directed Topical: Gentamicin Every Other Day/30 Days Discharge Instructions: As directed by physician Topical: Keystone antibiotic Every Other Day/30 Days Discharge Instructions: Once received, stop the gentamicin and Apply Keystone under the calcium alginate with silver. Prim Dressing: KerraCel Ag Gelling Fiber Dressing, 4x5 in (silver alginate) (DME) (Generic) Every Other Day/30 Days ary Discharge Instructions: Apply silver alginate to wound bed as instructed Secondary Dressing: Woven Gauze Sponge, Non-Sterile 4x4 in (DME) (Generic) Every Other Day/30 Days Discharge Instructions: Apply over primary dressing as directed. Secondary Dressing: ABD Pad, 8x10 (DME) Every Other Day/30 Days Discharge Instructions: Apply over primary dressing as directed. Compression Wrap: Kerlix Roll 4.5x3.1 (in/yd) (DME) (Generic) Every Other Day/30 Days Discharge Instructions: Apply Kerlix and Coban compression as directed. Compression Wrap: Coban Self-Adherent Wrap 4x5 (in/yd) (DME) (Generic) Every Other Day/30 Days Discharge Instructions: Apply over Kerlix as directed. Wound #33 - T Second oe Wound Laterality: Right Cleanser: Soap and Water Every Other Day/30 Days Discharge Instructions: May shower and wash wound with dial antibacterial soap and water prior to dressing change. Peri-Wound Care: Sween Lotion (Moisturizing lotion) Every Other Day/30 Days Discharge Instructions: Apply moisturizing lotion as directed Topical: Gentamicin Every Other  Day/30 Days Discharge Instructions: As directed by physician Topical: Keystone antibiotic Every Other Day/30 Days Discharge Instructions: Once received, stop the gentamicin and Apply Keystone under the calcium alginate with silver. Prim Dressing: KerraCel Ag Gelling Fiber Dressing, 4x5 in (silver alginate) (DME) (Generic) Every Other Day/30 Days ary Discharge Instructions: Apply silver alginate to wound bed as instructed Secondary Dressing: Woven Gauze Sponge, Non-Sterile 4x4 in (DME) (Generic) Every Other Day/30 Days Discharge Instructions: Apply over primary dressing as directed. Secondary Dressing: ABD Pad, 8x10 (DME) Every Other Day/30 Days Discharge Instructions: Apply over primary dressing as directed. Compression Wrap: Kerlix Roll 4.5x3.1 (in/yd) (DME) (Generic) Every Other Day/30 Days Discharge Instructions: Apply Kerlix and Coban compression as directed. Compression Wrap: Coban Self-Adherent Wrap 4x5 (in/yd) (DME) (Generic) Every Other Day/30 Days Discharge Instructions: Apply over Kerlix as directed. Wound #5 - Lower Leg Wound Laterality: Right, Medial Cleanser: Soap and Water Every Other Day/30 Days Discharge Instructions: May shower and wash wound with dial antibacterial soap and water prior to dressing change. Peri-Wound Care: Sween Lotion (Moisturizing lotion) Every Other Day/30 Days Discharge Instructions: Apply moisturizing lotion as directed Topical: Gentamicin Every Other Day/30 Days Discharge Instructions: As directed by physician Topical: Keystone antibiotic Every Other Day/30 Days Discharge Instructions: Once received, stop the  gentamicin and Apply Keystone under the calcium alginate with silver. Prim Dressing: KerraCel Ag Gelling Fiber Dressing, 4x5 in (silver alginate) (DME) (Generic) Every Other Day/30 Days ary Discharge Instructions: Apply silver alginate to wound bed as instructed Secondary Dressing: Woven Gauze Sponge, Non-Sterile 4x4 in (DME) (Generic) Every  Other Day/30 Days Discharge Instructions: Apply over primary dressing as directed. Secondary Dressing: ABD Pad, 8x10 (DME) Every Other Day/30 Days Discharge Instructions: Apply over primary dressing as directed. Compression Wrap: Kerlix Roll 4.5x3.1 (in/yd) (DME) (Generic) Every Other Day/30 Days Discharge Instructions: Apply Kerlix and Coban compression as directed. Compression Wrap: Coban Self-Adherent Wrap 4x5 (in/yd) (DME) (Generic) Every Other Day/30 Days Discharge Instructions: Apply over Kerlix as directed. Consults Infectious Disease - referral to Infectious Disease Dr. Baxter Flattery related to infection to right leg. Electronic Signature(s) Signed: 01/17/2021 5:23:51 PM By: Linton Ham MD Signed: 01/17/2021 5:33:58 PM By: Deon Pilling Entered By: Deon Pilling on 01/17/2021 12:35:55 Prescription 01/17/2021 -------------------------------------------------------------------------------- Elta Guadeloupe MD Patient Name: Provider: 01-25-1951 [id number] Date of Birth: NPI#Wanda Plump [id number] Sex: DEA #: [phone] Phone #: License #: [place] Patient Address: [address] [place], [place] [address] [place], [place] [address] [phone] Allergies Sulfa (Sulfonamide Antibiotics); Levaquin; penicillin Provider's Orders Infectious Disease - referral to Infectious Disease Dr. Baxter Flattery related to infection to right leg. Hand Signature: Date(s): Electronic Signature(s) Signed: 01/17/2021 5:23:51 PM By: Linton Ham MD Signed: 01/17/2021 5:33:58 PM By: Deon Pilling Entered By: Deon Pilling on 01/17/2021 12:36:07 -------------------------------------------------------------------------------- Problem List Details Patient Name: Date of Service: [name], [name] 01/17/2021 10:45 A M Medical Record Number: [id number] Patient Account Number: [id number] Date of Birth/Sex: Treating  RN: [birth year] (70 y.o. 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 02/24/2015 No Yes I87.331 Chronic venous hypertension (idiopathic) with ulcer and inflammation of right 10/04/2016 No Yes lower extremity L89.616 Pressure-induced deep tissue damage of right heel 12/14/2019 No Yes L97.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 05/31/2020 No Yes L97.528 Non-pressure chronic ulcer of other part of left foot with other specified 12/05/2020 No Yes severity I70.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 10/31/2020 No Yes lower leg I70.248 Atherosclerosis of native arteries of left leg with ulceration of other part of 10/31/2020 No Yes lower leg Inactive Problems ICD-10 Code Description Active Date Inactive Date 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  01/17/2021 10:45 A M Medical Record Number: [id number] Patient Account Number: [id number] Date of Birth/Sex: Treating RN: 05-Oct-1951 (70 y.o. [name], [name] Primary Care Provider: [name] Other Clinician:  Referring Provider: Treating Provider/Extender: [name] in Treatment: 460 Subjective History of Present Illness (HPI) this is a patient who initially came to [place] for wounds on the medial malleoli bilaterally as well as her upper medial lower extremities bilaterally. These wounds eventually healed with assistance of Apligraf's bilaterally. While this was occurring she developed the current wound which opened into a fairly substantial wound on the right lower extremity. These are mostly secondary to venous stasis physiology however the patient also has underlying scleroderma, pulmonary hypertension. The wound has been making good progress lately with the Hydrofera Blue-based dressing. 03/17/2015; patient had a arterial evaluation a year ago. Her right ABI was 0.86 left was 1.0. T brachial index was 0.41 on the right 0.45 on the left. Her oe bilateral common femoral artery waveforms were triphasic. Her white popliteal posterior tibial artery and anterior tibial artery waveforms were monophasic with good amplitude. Luteal artery waveforms were biphasic it was felt that her bilateral great toe pressures are of normal although adequate for tissue healing. 03/24/2015. The condition of this wound is not really improved that. He was covered with as fibrinous surface slough and eschar. This underwent an aggressive debridement with both a curette and scalpel. I still cannot really get down to what I can would consider to be a viable surface. There is no evidence of infection. Previous workup for ischemia roughly a year ago was negative nevertheless I think that continues to be a concern 04/07/15. The patient arrived for application of second Apligraf. Once again the surface  of this wound is certainly less viable than I would like for an advanced treatment option. An aggressive debridement was done. She developed some arteriolar bleeding which required that along pressure and silver alginate. 04/21/15: change in the condition of this wound. Once again it is covered in a gelatinous surface slough. After debridement today surface of the wound looks somewhat better but now a heeling surface 05/05/15 Apligraf #4 applied.Still a lot of slough on this wound. 05/19/15 Apligraf #5 applied. Still a lot of slough on this wound I did not aggressively remove this 06/02/15; continued copious amounts of surface slough. This debridement fairly easily. 06/08/2015 -- the last time she had a venous duplex study done was over 3 years ago and the surgery was prior to that. I have recommended that she sees Dr. early for a another opinion regarding a repeat venous duplex and possibly more endovenous ablation of vein stripping of micro-phlebotomies. 06/16/15; wound has a gelatinous surface eschar that the debridement fairly easily to a point. I don't disagree with the venous workup and perhaps even arterial re-evaluation. She is on prednisone 5 mg and continue his medications for her pulmonary artery hypertension I am not sure if the latter have any wound care healing issues I would need to investigate this. 06/23/15 continues with a gelatinous surface eschar with of fibrinous underlying. What I can see of this wound does not look unhealthy however I just can't get this material which I think is 2 different layers off. Empiric culture done 06/30/15; unfortunately not a lot of change in this wound. A gelatinous surface eschar is easily removed however it has a tight fibrinous surface underneath the. 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 The wound comes back and basically and unchanged state the. She has a gelatinous surface that is more easily removed however there is a  tightly fibrinous surface underneath the. There is no evidence of infection.  She has a vascular follow-up next month. I would have to inject her in order to do a more aggressive debridement of this area 07/21/15 the wound is roughly in the same state albeit the debridement is done with greater ease. There is less of the fibrinous eschar underneath the. There is no evidence of infection. She has follow-up with vascular surgery next week. No evidence of surrounding infection. Her original distal wounds healed while this one formed. 07/28/15; wound is easier to debride. No wound erythema. She is seeing Dr. [name] next week. 08/18/15 Has been to [facility] for repeat ablation. Have been using medihoney pad with some improvement 08/25/15; absolutely no change in the condition of this wound in either its overall size or surface condition in many months now. At one point I had this down to [place] healthier surface I think with [facility] however this did not actually progress towards closure. Do not believe that the wound has actually deteriorated in terms of volume at all. We have been using a medihoney pad which allows easier debridement of the gelatinous eschar but again no overall actual improvement. the patient is going towards an ablation with pain and vascular which I think is scheduled for next week. The only other investigation that I could first see would be a biopsy. She does have underlying scleroderma 09/02/15 eschar is much easier to debridement however the base of this does not look particularly vibrant. We changed to Iodoflex. The patient had her ablation earlier this week 09/09/15 again the debridement over the base of this wound is easier and the base of the wound looks considerably better. We will continue the Iodoflex. Dr. Tawni Millers has expressed his satisfaction with the result of her ablation 09/15/15 once again the wound is relatively free of surface eschar. No debridement was done today. It  has a pale-looking base to it. although this is not as deep as it once was it seems to be expanding especially inferiorly. She has had recent venous ablation but this is no closer to healing.I've gone ahead and done a punch biopsy this from the inferior part of the wound close to normal skin 09/22/15: the wound is relatively free of surface eschar. There is some surrounding eschar. I'm not exactly sure at what level the surface is that I am seeing. 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 No evidence of atypical infection, malignancy. 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 No debridement was done 09/29/15; the wound surface is relatively free of surface eschar. There is eschar surrounding the walls of the wound. Aside from the improvement in the amount of surface slough. This wound has not progressed any towards closure. There is not even a surface that looks like there at this is ready. There is no evidence of any infection nor maligancy based on biopsy I did on 9/15. I continued with the Iodoflex however I am looking towards some alternative to try to promote some closure or filling in of this surface. Consider triple [place]. Collagen did not result in adequate control of the surface slough 10/13/15; the patient was in hospital last week with severe anemia. The wound looks somewhat better after debridement although there is widening medially. There is no evidence of infection. 10/20/15; patient's wound on the right lateral lower leg is essentially unchanged. This underwent a light surface debridement and in general the debridement is easier and the surface looks improved. I noted in doing this on the side  of the wound what appears to be a piece of calcium deposition. The patient noted that she had previous things on the tips of her fingers. 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 She follows with rheumatology and I have asked them to talk to her about this. In view of that S the nonhealing ulcer may have something to do with calcinosis and also unrelieved Raynaud's disease in this area. I should note that her original wounds on the right and left medial malleolus and the inner aspects of both legs just below the knees did however heal over 10/30/15; the patient's wound on the right lateral lower leg is essentially unchanged. I was able to remove some calcified material from the medial wound edge. 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 Otherwise the wound appears essentially unchanged there is less adherent surface eschar. Some of the calcified material was sent to pathology for analysis 11/17/15. The patient's wound on the right lateral leg is essentially unchanged. Wider Medially. 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 There is some surface slough but I did not debridement this. No evidence of surrounding infection. 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 This will make healing difficult 12/15/15; the wound has less depth surface has a fibrinous slough and calcifications in the wound edge no evidence of infection 12/22/15; the wound definitely has less Fibrinous slough on the base. Calcifications around the wound edges are still evident. Although the wound bed looks healthier it is still pale in appearance. Previous biopsy did not show malignancy 01/04/15; surgical debridement of nonviable slough and subcutaneous tissue the wound cleans up quite nicely but appears to be expanding outward calcifications around the  wound edges are still there. 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 She is to see her rheumatologist I'll see if he has any opinions on this. My punch biopsy done in September did not show calcifications although these are clearly evident. 01/19/16 light selective debridement of nonviable surface slough. There is epithelialization medially. This gives me reason for cautious optimism. She has been to see her rheumatologist, there is nothing that can be done for this type of soft tissue calcification associated with scleroderma 02/02/16 no debridement although there is a light surface slough. She has 2 peninsulas of skin 1 inferiorly and one medially. We continue to make a slight and slow but definite progressive here 02/16/16; light surface debridement with more attention to the circumference of the wound bed where the fibrinous eschar is more prevalent. No calcifications detected. She seems to have done nicely with the [facility] with some epithelialization and some improvement in the overall wound volume. 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 Continued attention to the circumference of the wound where the fibrinous eschar in calcinosis or prevalent. No calcifications were detected. She has continued improvement with Hydrofera Blue. The wound is no longer as deep 03/15/16 surgical debridement done to remove surface escha Especially around the circumference of the wound where there is nonviable subcutaneous tissue. In  spite of this there is considerable improvement in the overall dimensions and depth of the wound. Islands of epithelialization are seen especially medially inferiorly and superiorly to a lesser extent. She is using Hydrofera Blue at home 03/29/16; surgical debridement done to remove surface eschar and nonviable  subcutaneous tissue. This cleans up quite nicely mention slightly larger in terms of length and width however depth is less 04/12/16; continued gradual improvement in terms of depth and the condition of the wound base. Debridement is done. Continuing long standing Hydrofera Blue at home with Kerlix Coban wraps 04/26/16; continued gradual improvement in terms of depth and management as well as condition of the wound base. Surgical debridement done she continues with Hydrofera Blue. 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 She initially came to this clinic venous insufficiency ulcers which have long since healed 05/17/16 continued improvement in terms of the depth and measurements of this wound although she has a tightly adherent fibrinous slough each time. We've been continued with long standing Hydrofera Blue which seems to done as well for this wound is many advanced treatment options. 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 She also has chronic venous insufficiency. She has an irritation on her lateral right ankle secondary to our wraps 05/10/16; wound appears to be smaller especially on the medial aspect and especially in the width. Wound was debridement surface looks better. She is also been in the hospital apparently with anemia again she tells me she had an endoscopy. Since she got home after 3 days which I believe was sometime last week she has had an irritated painful area on the right lateral ankle surrounding the lateral malleolus 05/31/16; much more adherent surface slough today then recently although I don't think the dimensions of changed that much. A more aggressive debridement is required. The irritated area over her medial malleolus is more pruritic and painful and I don't think represents cellulitis 06/14/16; no major change in her wound dimensions however there is more tightly adherent surface slough which is  disappointing. As well as she appears to have a new small area medially. Furthermore an irritated uncomfortable area on the lateral aspect of her right foot just below the lateral malleolus. 06/21/16; I'm seeing the patient back in follow-up for the new areas under her major wound on the right anterior leg. She has been using Hydrofera Blue to this area probably for several months now and although the dressings seem to be helping for quite a period of time I think things have stagnated lately. She comes in today with a relatively tight adherent surface slough and really no changes in the wound shape or dimension. The 2 small areas she had inferiorly are tiny but still open they seem improved this well. There is no uncontrolled edema and I don't think there is any evidence of cellulitis. 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 Patient has had vascular evaluations both venous and arterial. I have biopsied this area. There is no obvious infection. The worrisome thing today is that she seems to be developing areas of erythema and epithelial damage on the medial aspect of the right foot. Also to a lesser degree inferior to the actual wound itself. Again I see no obvious changes to suggest cellulitis however as this is the only treatable option I will probably give her  antibiotics. 07/13/16 no major change in the lady's large anterior right leg wound. Still covered with a very tightly adherent surface slough which is difficult to debridement. There is less erythema around this, culture last week grew pseudomonas I gave her ciprofloxacin. The area on her lateral right malleolus looks better- 08/02/16 the patient's wounds continued to decline. Her original large anterior right leg wound looks deeper. Still adherent surface slough that is difficult to debridement. She has a small area just below this and a  punched-out wound just below her lateral malleolus. In the meantime she is been in hospital with apparently an upper GI bleed on Plavix and aspirin. She is now just on Plavix she received 3 units of packed cells 08/23/16; since I last saw this 3 weeks ago, the open large area on her right leg looks about the same syrup. She has a small satellite lesion just underneath this. The area on her medial right ankle is now a deep necrotic wound. I attempted to debridement this however there is just too much pain. 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 She follows with vascular surgery and is scheduled for an angiogram in early September 09/06/16; the patient is going for an arteriogram tomorrow. Her original large wound on the right calf is about the same the satellite lesion underneath it is about the same however the area on her medial ankle is now deeper with exposed tendon. I am no longer attempting to debride these wounds 09/20/16; the patient has undergone a right femoral endarterectomy and Dacron patch angioplasty. This seems to have helped the flow in her right leg. 10/04/16; Arrived today for aggressive debridement of the wounds on her right calf the original wound the one beneath it and a difficult area over her right lateral leg just above the lateral malleolus. 10/25/16; her 3 open wounds are about the same in terms of dimensions however the surface appears a lot healthier post debridement. Using Iodoflex 10/18/16 we have been using Iodoflex to her wounds which she tolerated with some difficulty. 10/11/16; has been using Santyl for a period of time with some improvement although again very adherent surface slough would prevent any attempted healing this. She has a original wound on the left calf, the satellite underneath that and the most recent wound on the right medial ankle. She has completed revascularization by Dr. early  and has had venous ablation earlier. Want to go back to Iodoflex to see if week and get a healthier surface to this wound bed failing this I think she'll need to be taken to the OR and I am prepared to call Dr. Marla Roe to discuss this. She is obviously not a good candidate for general anesthesia however.; 11/08/16; I put her on Iodoflex last time to see if I can get the wound bed any healthier and unfortunately today still had tremendous surface slough. 11/15/16; 4 weeks' worth of Iodoflex with not much improvement. Debridement on the major wounds on her left anterior leg is easier however this does not maintain from week to week. The punched-out area on her medial right ankle 11/29/16; I attempted to change the patient last visit to [facility] however she states this burned and was very uncomfortable therefore we gave her permission to go back to the eye out of complex which she already had at home. Also she noted a lot of pain and swelling on the lateral aspect of  her leg before she traveled to [facility] for the holidays, I called her in doxycycline over the phone. This seems to have helped 12/06/16; Wounds unchanged by in large. Using Iodoflex 12/13/16; her wounds today actually looks somewhat better. The area on the right lateral lower leg has reasonably healthy-looking granulation and perhaps as actually filled and a bit. Debridement of the 2 wounds on the medial calf is easier and post debridement appears to have a healthier base. We have referred her to Dr. Migdalia Dk for consideration of operative debridement 12/20/16; we have a quick note from Dr. Merri Ray who feels that the patient needs to be referred to an academic center/plastic surgeon. This is due to the complexity of the patient's medical issues as it applies to anything in the OR. We have been using Iodoflex 12/27/16; in general the wounds on the right leg are better in terms of the difficult to remove  surface slough. She has been using Iodoflex. She is approved for Apligraf which I anticipated ordering in the next week or 2 when we get a better-looking surface 01/04/16 the deep wounds on the right leg generally look better. Both of them are debrided further surface slough. The area on the lateral right leg was not debrided. She is approved for Apligraf I think I'll probably order this either next week or the week after depending on the surface of the wounds superiorly. We have been using Iodoflex which will continue until then 01/11/16; the deep wounds on the right leg again have a surface slough that requires debridement. I've not been able to get the wound bed on either one of these wounds down to what would be acceptable for an advanced skin stab-like Apligraf. The area on the medial leg has been improving. We have been using hot Iodoflex to all wounds which seems to do the best at at least limiting the nonviable surface 01/24/17; we have continued Iodoflex and all her wound areas. Her debridement Gen. he is easier and the surface underneath this looks viable. Nevertheless these are large area wounds with exposed muscle at least on the anterior parts. We have ordered Apligraf's for 2 weeks from now. The patient will be away next week 02/07/17; the patient was close to have first Apligraf today however we did not order one. I therefore replaced her Iodoflex. She essentially has 3 large punched- out areas on her right anterior leg and right medial ankle. 02/11/17; Apligraf #1 02/25/17; Apligraf #2. In general some improvement in the right medial ankle and right anterior leg wounds. The larger wound medially perhaps some better 03/11/17; Apligraf #3. In general continued improvement in the right medial ankle and the right anterior leg wounds. 03/25/17 Apligraf #4. In general continued improvement especially on the right medial ankle and the lower 04/08/17; Apligraf #5 in general continued improvement in all  wound sites. 04/22/17; post Apligraf #5 her wound beds continued to look a lot better all of them up to the surface of the surrounding skin. Had a caramel-colored slough that I did not debrided in case there is residual Apligraf effect. The wounds are as good as I have seen these looking quite some time. 04/29/17; we applied [place] and last week after completing her Apligraf. Wounds look as though they've contracted somewhat although they have a nonviable surface which was problematic in the past. Apparently she has been approved for further Apligraf's. We applied Iodosorb today after debridement. 05/06/17; we're fortunate enough today to be able to apply  additional Apligraf approved by her insurance. In general all of her wounds look better Apligraf #6 05/24/17; Apligraf #7 continued improvement in all wounds o3 06/10/17 Apligraf #8. Continued improvement in the surface of all wounds. Not much of an improvement in dimensions as I might a follow 06/24/17 Apligraf #9 continued general improvement although not as much change in the wound areas I might of like. She has a new open area on the right anterior lateral ankle very small and superficial. She also has a necrotic wound on the tip of her right index finger probably secondary to severe uncontrolled Raynaud's phenomenon. She is already on sildenafil and already seen her rheumatologist who gave her Keflex. 07/08/17; Apligraf #10. Generalized improvement although she has a small additional wound just medial to the major wound area. 07/22/17; after discussion we decided not to reorder any further Apligraf's although there is been considerable improvement with these it hasn't been recently. The major wound anteriorly looks better. Smaller wounds beneath this and the more recent one and laterally look about the same. The area on the right lateral lower leg looks about the same 07/29/17 this is a patient who is exceedingly complex. She has advanced  scleroderma, crest syndrome including calcinosis, PAD status post revascularization, chronic venous insufficiency status post ablations. She initially presented to this clinic with wounds on her bilateral lower legs however these closed. More recently we have been dealing with a large open area superiorly on the right anterior leg, a smaller wound underneath this and then another one on the right medial lower extremity. These improve significantly with 10 Apligraf applications. Over the last 2-3 weeks we are making good progress with Hydrofera Blue and these seem to be making progress towards closure 08/19/17; wounds continued to make good improvement with Hydrofera Blue and episodic aggressive debridements 08/26/17; still using Hydrofera Blue. Good improvements 09/09/17; using Hydrofera Blue continued improvement. Area on the lateral part of her right leg has only a small remaining open area. The small inferior satellite region is for all intents and purposes closed. Her major wound also is come in in terms of depth and has advancing epithelialization. 09/16/17; using Hydrofera Blue with continued improvement. The smaller satellite wound. We've closed out today along with a new were satellite wound medially. The area on her medial ankle is still open and her major wound is still open but making improvement. All using Hydrofera Blue. Currently 09/30/17; using Hydrofera Blue. Still a small open area on the lateral right ankle area and her original major wound seems to be making gradual and steady improvement. 10/14/17; still using Hydrofera Blue. Still too small open areas on the right lateral ankle. Her original major wound is horizontal and linear. The most problematic area paradoxically seems to be the area to the medial area wears I thought it would be the lateral. 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 10/28/17; still using Hydrofera Blue. Right  lateral ankle has a very small open area superiorly on the most lateral part of the wound. Her original open wound has 2 open areas now separated by normal skin and we've redefined this. 11/11/17; still using Hydrofera Blue area and right lateral ankle continues to have a small open area on mostly the lateral part of the wound. Her original wound has 2 small open areas now separated by a considerable amount of normal skin 11/28/17; the patient called in slightly before Thanksgiving to report pain and erythema above  the wound on the right leg. In the past this is responded well to treatment for cellulitis and I gave her over the phone doxycycline. She stated this resulted in fairly abrupt improvement. We have been using Hydrofera Blue for a prolonged period of time to the larger wound anteriorly into the remaining wound on her right right lateral ankle. The latter is just about closed with only a small linear area and the bottom of the [place]. 12/02/17; use endoform and left the dressing on since last visit. There is no tenderness and no evidence of infection. 12/16/17; patient has been using Endoform but not making much progress. The 2 punched out open areas anteriorly which were the reminiscence of her major wound appear deeper. The area on the lateral aspect of her right calf also appears deeper. Also she has a puzzling tender swelling above her wound on the right leg. This seems larger than last time. Just above her wounds there appears to be some fluctuance in this area it is not erythematous and there is no crepitus 12/30/17; patient has been using Endoform up until last week we used Hydrofera Blue. Ultrasound of the swelling above her 2 major wounds last time was negative for a fluid collection. I gave her cefaclor for the erythema and tenderness in this area which seems better. Unfortunately both punched out areas anteriorly and the area on her right medial lower leg appear deeper. In fact the  lateral of the wounds anteriorly actually looks as though it has exposed tendon and/or muscle sheath. She is not systemically unwell. She is complaining of vaginitis type symptoms presumably Candida from her antibiotics. 01/06/18; we're using santyl. she has 2 punched out areas anteriorly which were initially part of a large wound. Unfortunately medially this is now open to tendon/muscle. All the wounds have the same adherent very difficult to debride surface. 01/20/18; 2 week follow-up using Santyl. She has the 2 punched out areas anteriorly which were initially part of her large surface wound there. Medially this still has exposed muscle. All of these have the same tightly adherent necrotic surface which requires debridement. PuraPly was not accepted by the patient's insurance however her insurance I think it changed therefore we are going to run Apligraf to gain 02/03/18; the patient has been using Santyl. The wound on the right lateral ankle looks improved and the 2 areas anteriorly on the right leg looks about the same. The medial one has exposed muscle. The lateral 1 requiresdebridement. We use PuraPly today for the first week 02/10/18; PuraPly #2. The patient has 3 wounds. The area on the right lateral ankle, 2 areas anteriorly that were part of her original large wound in this area the medial one has exposed muscle. All of the wounds were lightly debrided with a number 3 curet. PuraPly #2 applied the lateral wound on the calf and the right lateral ankle look better. 02/17/18; PuraPly #3. Patient has 3 wounds. The area on the right lateral ankle in 2 areas internally that were part of her original large wound. The lateral area has exposed muscle. She arrived with some complaints of pain around the right ankle. 02/24/18; PuraPly #4; not much change in any of the 3 wound areas. Right lateral ankle, right lateral calf. Both of these required debridement with a #3 curet. She tolerates this marginally. The  area on the medial leg still has exposed muscle. Not much change in dimensions 03/03/18 PuraPly #5. The area on the medial ankle actually looks better  however the 2 separate areas that were original parts of the larger right anterior leg wound look as though they're attempting to coalesce. 03/10/18; PuraPly #6. The area on the medial ankle actually continues to look and measures smaller however the 2 separate areas that were part of the original large wound on the right anterior leg have now coalesced. There hasn't been much improvement here. The lateral area actually has underlying exposed muscle 03/17/18-she is here in follow-up evaluation for ulcerations to the right lower extremity. She is voicing no complaints or concerns. She tolerated debridement. Puraply#7 placed 03/24/18; difficult right lower extremity ulcerations. PuraPly #8 place. She is been approved for [employer]. She did very well with Apligraf today however she is apparently reached her "lifetime max" 03/31/18; marginal improvement with PuraPly although her wounds looked as good as they have in several weeks today. Used TheraSkins #1 04/14/18 TheraSkin #2 today 04/28/18 TheraSkins #3. Wound slightly improved 05/12/18; TheraSkin skin #4. Wound response has been variable. 05/27/18 TheraSkin #5. Generally improvement in all wound areas. I've also put her in 3 layer compression to help with the severe venous hypertension 06/09/18; patient is done quite well with the TheraSkins unfortunately we have no further applications. I also put her in 3 layer compression last week and that really seems to of helped. 06/16/18; we have been using silver collagen. Wounds are smaller. Still be open area to the muscle layer of her calf however even that is contracted somewhat. She tells me that at night sometimes she has pain on the right lateral calf at the site of her lower wound. Notable that I put her into 3 layer compression about 3 weeks ago. She states that  she dangles her leg over the bed that makes it feel better but she does not describe claudication during the day ooShe is going to call her secondary insurance to see if they will continue to cover advanced treatment products I have reviewed her arterial studies from 01/22/17; this showed an ABI in the right of 1 and on the left noncompressible. TBI on the right at 0.30 on the left at 0.34. It is therefore possible she has significant PAD with medial calcification falsely elevating her ABI into the normal range. I'll need to be careful about asking her about this next week it's possible the 3 layer compression is too much 06/23/18; was able to reapply TheraSkin 1 today. Edema control is good and she is not complaining of pain no claudication 07/07/18;no major change. New wound which was apparently a taper removal injury today in our clinic between her 2 wounds on the right calf TheraSkins #2 07/14/18; I think there is some improvement in the right lateral ankle and the medial part of her wound. There is still exposed muscle medially. 07/28/18; two-week follow-up. TheraSkins#3. Unfortunately no major change. 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 She seems to be doing well and in the base of the wound appears to show some progress at this point. This is the last approved Theraskin of the second set. 08/25/18; she has completed TheraSkin. There has been some improvement on the right lateral calf wound as well as the anterior leg wounds. The open area to muscle medially on the anterior leg wound is smaller. I'm going to transition her back to [facility] under [employer]  Coban change every second day. She reports that she had some calcification removed from her right upper arm. We have had previous problems with  calcifications in her wounds on her legs but that has not happened recently 09/08/18;using Hydrofera Blue on both her wound areas. Wounds seem to of contracted nicely. She uses Kerlix Coban wrap and changes at home herself 09/22/2018; using Hydrofera Blue on both her wound areas. Dimensions seem to have come down somewhat. There is certainly less depth in the medial part of the mid tibia wound and I do not think there is any exposed muscle at this point. 10/06/2018; 2-week follow-up. Using [facility] on her wound areas. Dimensions have come down nicely both on the right lateral ankle area in the right mid tibial area. She has no new complaints 10/20/2018; 2-week follow-up. She is using Hydrofera Blue. Not much change from the last time she was here. The area on the lateral ankle has less depth although it has raised edges on one side. I attempted to remove as much of the raised edge as I could without creating more additional wounds. The area on the right anterior mid tibia area looks the same. 11/03/2018; 2-week follow-up she is using Hydrofera Blue. On the right anterior leg she now has 2 wounds separated by a large area of normal skin. The area on the medial part still has I think exposed muscle although this area itself is a lot smaller. The area laterally has some depth. Both areas with necrotic debris. ooThe area on her right lateral ankle has come in nicely 11/17/2018; patient continues to use Hydrofera Blue. We have been increasing separation of the 2 wounds anteriorly which were at one point joint the area on the right lateral calf continues to have I think some improvement in depth. 12/08/2018; patient continues to use Hydrofera Blue. There is some improvement in the area on the right lateral calf. The 2 areas that were initially part of the original large wound in the mid right tibia are probably about the same. In fact the medial area is probably somewhat larger. We will run puraply  through the patient's insurance 12/22/2018; she has been using Hydrofera Blue. We have small wounds on the right lateral calf and 2 small areas that were initially part of a large wound in the right mid tibia. We applied pure apply #1 today. 12/29/2018; we applied puraply #2. Her wounds look somewhat better especially on the right lateral calf and the lateral part of her original wound in the mid tibial area. 01/05/2019; perhaps slightly improved in terms of wound bed condition but certainly not as much improvement as I might of liked. Puraply #3 1/13: we did not have a correct sized puraply to apply. wounds more pinched out looking, I increased her compression to 3 layer last week to help with significant multilevel venous hypertension. Since then I've reviewed her arterial status. She has a right femoral endarterectomy and a distal left SFA stent. She was being followed by Dr. [name] for a period however she does not appear to have seen him in 3 years. I will set up an appointment. 1/20. The patient has an additional wound on the right lateral calf between the distal wound and proximal wounds. We did not have Puraply last week. Still does not have a follow-up with Dr. early 1/27: Follow-up with Dr. early has been arranged apparently with follow-up noninvasive studies. Wounds are measuring roughly the same although they certainly look smaller 2/3; the  patient had non invasive studies. Her ABIs on the right were 0.83 and on the left 1.02 however there was no great toe pressure bilaterally. Also worrisome monophasic waveforms at the PTA and dorsalis pedis. We are still using Puraply. We have had some improvement in all of the wounds especially the lateral part of the mid tibial area. 2/10; sees Dr. early of vein and vascular re-arterial studies next week. Puraply reapplied today. 2/17; Dr. early of vein and vascular his appointment is tomorrow. Puraply reapplied after debridement of all wounds 2/24;  the patient saw Dr. early I reviewed his note. sHe noted the previous right femoral endarterectomy with a Dacron patch. He also noted the normal ABI and the monophasic waveforms suggesting tibial disease. Overall he did not feel that she had any evidence of arterial insufficiency that would impair her wound healing. He did note her venous disease as well. He suggested PRN follow-up. 3/2; I had the last puraply applied today. The original wounds over the mid tibia area are improved where is the area on the right lower leg is not 3/9; wounds are smaller especially in the right mid tibia perhaps slightly in the right lateral calf. We finished with puraply and went to endoform today 3/23; the patient arrives after 2 weeks. She has been using endoform. I think all of her wounds look slightly better which includes the area on the right lateral calf just above the right lateral malleolus and the 2 in the right mid tibia which were initially part of the same wound. 4/27; TELEHEALTH visit; the patient was seen for telehealth visit today with her consent in the middle of the worldwide epidemic. Since she was last here she called in for antibiotics with pain and tenderness around the area on the right medial ankle. I gave her empiric doxycycline. She states this feels better. She is using endoform on both of these areas 5/11 TELEHEALTH; the patient was seen for telehealth visit today. She was accompanied at home by her husband. She has severe pulmonary hypertension accompanied scleroderma and in the face of the Covid epidemic cannot be safely brought into our clinic. We have been using endoform on her wound areas. There are essentially 3 wound areas now in the left mid tibia now 2 open areas that it one point were connected and one on the right lateral ankle just above the malleolus. 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 She cannot come to clinic. The wounds were reviewed today via telehealth. She has severe chronic venous hypertension which I think is centrally mediated. She has wounds on her right anterior tibia and right lateral ankle area. These are chronic. She has been using endoform. 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 We have been following her from telehealth. She has severe chronic venous hypertension which may be mostly centrally mediated secondary to right heart heart failure. She has wounds on her right anterior tibia and right lateral ankle these are chronic we have been using endoform 6/22; TELEHEALTH; this patient was seen today via telehealth. 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 Since the last time we  had contact with this patient she developed some pain and erythema around the wound on her right lateral malleolus/ankle and we put in antibiotics for her. This is resulted in good improvement with resolution of the erythema and tenderness. I changed her to silver alginate last time. We had been using endoform for an extended period of time 7/13; TELEHEALTH; this patient was seen today via telehealth. 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 She would continue to be at a prohibitive risk to be brought into our clinic unless this was absolutely necessary. These visits have been done with her approval as well as her husband. We have been using silver alginate to the areas on the right mid tibia and right lateral lower leg. 7/27 TELEHEALTH; patient was seen along with her husband today via telehealth. She has severe pulmonary hypertension secondary  to scleroderma on chronic oxygen and would be at risk to bring her into the clinic. We changed her to sample last visit. She has 2 areas a chronic wound on her right mid tibia and one just above her ankle. These were not the original wounds when she came into this clinic but she developed them during treatment 8/17; she comes in for her first face-to-face visit in a long period. She has a remaining area just medial to the right tibia which is the last open part of her large wound across this area. She also has an area on the right lateral lower leg. We prescribed Santyl last telehealth visit but they were concerned that this was making a deeper so they put silver alginate on it last week. Her husband changes the dressings. 8/31; using Santyl to the 2 wound areas some improvement in wound surfaces. Husband has surgery in 2 weeks we will put her out 3 weeks. Any of the advanced treatment options that I can think of that would be eligible for this wound would also cause her to have to come in weekly. The risk that the patient is just too high 9/21. Using Santyl to the 2 wound areas. Both of these are somewhat better although the medial mid tibia area still has exposed muscle. Lateral ankle requiring debridement. Using Santyl 10/12; using Santyl to the 2 wound areas. One on the right lateral ankle and the other in the medial calf which still has exposed muscle. Both areas have come down in size and have better looking surfaces. She has made nice progress with santyl 11/2; we have been using Santyl to the 2 wound areas. Right lateral ankle and the other in the mid tibia area the medial part of this still has exposed muscle. 11/23/2019 on evaluation today patient appears to be doing about the same really with regard to her wounds. She is actually not very pleased with how things seem to be progressing at this point she tells me that she really has not noted much improvement unfortunately. With that being  said there is no signs of active infection at this time. There is some slough buildup noted at this time which again along with some dry skin around the edges of the wound I think would benefit her to try to debride some of this away. Fortunately her pain is doing fairly well. She still has exposed muscle in the right medial/tibial area. 12/14; TELEHEALTH; she was changed to [facility] to the right calf wound and right lateral ankle wound when she was here last time. Unfortunately since then she had a fall with  a pelvic fracture and a fracture of her wrist. She was apparently hospitalized for 5 days. I have not looked at her discharge summary. She apparently came out of the hospital with a blister on her right heel. She was seen today via telehealth by myself and our case manager. The patient and her husband were present. She has been using Hydrofera Blue at our direction from the last time she was in the clinic. There is been no major improvement in fact the areas appear deeper and with a less viable surface 01/04/2020; TELEHEALTH; the patient was seen today in accompaniment of her husband and our nurse. She has 3 open areas 1 on the right medial mid tibia, one on the right lateral ankle and a large eschared pressure area on the right heel. We have been using Iodoflex to the 2 original wounds. 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 It is simply too perilous for her to be seen in any other way 1/26; TELEHEALTH; the patient was seen today in accompaniment of her husband and our RN one of our nurses. She still has the 3 open areas 1 on the right medial mid tibia which is the remanent of a more extensive wound in this area, one on the right lateral ankle and a large eschared pressure area on the right heel. We have been using Iodoflex to the 2 original wounds and a bed at 9 application to the eschared area on the heel 2/15; the first time we have seen this patient  and then several months out of concern for the pandemic. She had a large horizontal wound in the mid tibia. Only the medial aspect of this is still open. Area on the lateral ankle is just about closed. She had a new pressure ulcer on the right heel which I have removed some of the eschar. We have been using Iodoflex which I will continue. The area in the mid tibia has a round circle in the middle of exposed muscle. I think we would have to use an advanced treatment product to stimulate granulation over this area. We will run this through her insurance. She is not eligible for plastic surgery for many reasons 3/1; TELEHEALTH. The patient was seen today by telehealth. 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 We have been using Iodoflex to the wound areas which include the right anterior mid tibia, right lateral ankle and the right heel. All of these were reviewed 3/8; the patient's wound just above the right ankle is closed. She still has a contracting black eschar on the heel where she had a pressure ulcer. The medial part of her original wound on the mid tibia has exposed muscle. We have made really made no progress in this area although we have managed to get a lot of the original wound in this area to close. We used Apligraf #1 today 3/22; the patient's ankle wound remains closed. She still has a contracting black eschar on the heel although it seems to have less surface area. It still not clear whether there is any depth here. We used Apligraf #2 today in an attempt to get granulation over the exposed muscle and what is left of her mid tibia area. 4/5; everything is closed except the medial aspect of the mid tibia wound, as well as the pressure injury on the right heel. She has been using Betadine to the right  heel which has been gradually contracting. We used Apligraf #3 today. 4/19; Apligraf #4.  Still a pressure area on the right heel she has been using Betadine superficial excoriated areas she has been applying salicylic acid to on the anterior leg and the thick horn on the leg just above her wound area 5/3; Apligraf #5. Unfortunately she came in today with a reopening of the area on the right lateral lower leg. Not much change in the wound we have been treating in the mid calf. Quite a bit of edema surrounding this wound. She reports that she was up on her feet quite a bit. The area on the right heel is separating she has been using Betadine She comes in with a new wound on the left lateral malleolus which is very disappointing this is been open since last week 5/17; we applied her last Apligraf last time. Unfortunately that did not have any effect on the deep area medially in the mid tibial area. However the area over the right lateral malleolus is a lot better. Right heel also is contracted we used Iodoflex last time. The new wound on the left lateral malleolus were using Santyl to. This required debridement. 6/1. Not much change in the area on the right medial mid tibia. Right lateral ankle is improved she has the necrotic area on the right heel which was a pressure area. New area on the left lateral malleolus from last time. I changed her to Iodoflex to the area on the left lateral malleolus she said this hurt I have put her back on Santyl 6/15; right mid tibia is unchanged. I do not think there is anything I can do to this topically that we will get this to granulate over the muscle. And I am furthermore I am not sure that she is a surgical candidate either because of her severe pulmonary issues or because she really does not want to go through it. ooThe area on the right lateral malleolus is a small punched out area. ooThe area on the left lateral malleolus again small punched out with nonviable surface ooPressure ulcer on the right heel at separating black eschar. I went ahead  and remove this today Lura Em has an area on the left anterior mid tibia just laterally. Geographic wounds debris on the surface. Some erythema here. 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 There is skin changes widely Change the primary dressing to Sorbact to all wound areas. She needs compression on the left leg as well as the right 6/21; right mid tibia which was her one remaining wound at 1 point is unchanged ooThe area on the right lateral malleolus actually is close to closing over still a small punched out area ooThe area on the left lateral malleolus again a deep wound it is this 1 that she feels pain in. ooPressure ulcer on the right heel was cultured today. ooNew area on the left anterior mid tibia several geographic wounds last week in the setting of chronic venous insufficiency this actually looks some better 7/6; ooRight mid tibia about the same. Exposed muscle ooLeft lateral malleolus again necrotic debris over the surface. ooPressure ulcer on the right heel. She finished the Keflex we prescribed. Necrotic debris debrided with a #3 curette ooThe rest of her wounds on the right mid tibia right lateral ankle are better Her husband reminds me that she had a right femoral endarterectomy and has 2 stents in her left thigh placed in 2011 approximately by vascular  surgery in [place]. She saw Dr. [name] last in February 2020. At that point he did not think that the arterial insufficiency she had on the right was sufficient to explain any of her right lower extremity wounds however she now has an area on the left lateral malleolus that looks like an ischemic wound 7/12; we have been using Sorbact all wound areas ooRight mid tibia about the same exposed muscle ooLeft lateral malleolus small wound with debris in the surface punched out ooPressure ulcer of the right heel requiring debridement of necrotic debris ooLateral ankle wound on the right is just  about closed ooRight mid tibia wound about the same still with exposed muscle. 7/26 really no change in any wounds. We have been using Sorbact. She has bilateral lower extremity wounds. This is in the setting of scleroderma, severe venous hypertension. She also has known PAD and I had really hoped to be able to get a review by Dr. [name] but apparently that is not booked until August 31. I have asked her to call back to vein and vascular and get an appointment with somebody a little earlier if that is possible. 8/16; patient's area on the right ankle which was a chronic wound is closed over again. The area on the right mid lower leg, right heel left lateral ankle are all about the same. Pressure ulcer on the right heel is still not viable. New areas identified on the right lateral calf. She has a follow-up with Dr. [name] on 8/31. Phenomenally I would like him to go over her arterial status with regards to her underlying stents. 9/13; patient had her ARTERIAL studies done however apparently Dr. [name] is now in [place] so she did not get an appointment with him on 8/31 and was not referred to any other provider. On the right side her PTA was monophasic. Noncompressible. TBI on the right at 0.41 on the left she was monophasic and noncompressible they did not do a TBI because she had a Band-Aid on her big toe. Patient does not describe claudication although she is having a lot of pain in the left lateral malleolus. 10/11; the patient had a right-sided interventions on 10/5. She had a angioplasty of the right anterior tibial artery as well as the tibioperoneal trunk/peroneal artery she had a drug-coated balloon angioplasty of the right superficial femoral artery. On the left she did not have any interventions yet but is going to have another look at this in 2 weeks. The superficial femoral artery on the left and the associated stent are patent popliteal artery is patent to the dominant vessel runoff is  the anterior tibial which has several high-grade stenosis in the proximal one third. We have been using Sorbact. She also has a new area on the right upper mid tibia. 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 She tolerated this well. She is using Iodoflex and her husband is doing a kerlix Coban on her. She arrives in clinic today with the original medial part of her mid tibia wound with muscle exposure small area on the right ankle which was part of her original wound she has 2 punched-out areas laterally and 1 above her area anteriorly. Nonviable surfaces similarly the right heel is nonviable. On the left side  she has an area on the left foot and the left lateral ankle similar nonviable surfaces. 11/8; patient arrives in clinic today with nothing improved. She has a multiple lightly group of wounds on the right lower leg presumably different etiologies including a skin biopsy, the original wound she had medially and some punched-out areas that are not of clear defined etiology. She says she was in a lot of pain this week could not have her leg up on the bed did not get completely relief from dropping it over the bed. She also has a small area on the left lateral malleolus and the left lateral foot necrotic surfaces on all of these wounds. We have been using Iodoflex I attempted to put her in 3 layer compression last week that did not go well. We will be backing off on kerlix Coban this week. 11/15; the ultrasound that I sent the patient for last week showed findings consistent with acute deep vein thrombosis involving the right distal femoral vein and right popliteal vein. I was concerned about the combination of Plavix and Xarelto however apparently  Dr. [name] felt that was not a problem. She is now going through the acute dosing of Xarelto. She has an appointment with Dr. Trula Slade on 12/5. 11/29; 10 days ago before he left for weeks vacation she called to report pain in the right leg I sent her in some doxycycline her husband thought things were getting better however she required admission to hospital from 11/22 through 11/24 prompted by finding that her hemoglobin was 5.7 she was transfused 2 units of packed cells and her hemoglobin stabilized. She did undergo an endoscopy with findings of significant esophagitis part of which was candidal and part of it reflux. She was discharged on nystatin swish and swallow which she is doing. She seems to be better from that regard. There was no active other active source of bleeding Unfortunately the combination of Xarelto and Plavix was felt to be too much for her. The Xarelto was stopped and an IVC filter was placed she is continued on Plavix. Her big complaint today is pain in the right calf really from the mid aspect superiorly. This is very tender slightly red but not particularly warm. She did see vein and vascular in the hospital I will see if I can pull their note 12/6; patient was at Dr. Stephens Shire office today. Great toe pressure on the right is 54 on the left 49 she has a 50 to 74% stenosis of the right mid popliteal on the left at 30 to 49% proximal SFA and a 50 to 74% CFA. She was treated with angioplasty of the right ATA, right TP trunk and peroneal artery as well as an angioplasty of the right SFA to popliteal artery. On 10/26 she underwent angioplasty of the left ATA and left TP trunk she has a history of a left common femoral endarterectomy and left superficial femoral artery stent as well as a right superficial femoral artery stent. In addition to her arterial disease she has significant venous disease with a history of a right greater saphenous vein ablation and a recent DVT in the  right femoral and popliteal vein with insertion of an IVC filter. She has remained on Plavix but her anticoagulant was stopped. She continues to have a lot of swelling and pain in the right leg with significant erythema which I am assuming is stasis dermatitis. She has had substantial wound areas on the right leg including right lateral a  large necrotic wound with a smaller biopsy site above this. She has original wound on the left mid tibia with a small area above this as well. On the left she has a small area over the left lateral malleolus and the left lateral foot 12/22-week follow-up. Her right leg looks really terrible. She has another necrotic wound just above her original one on the right posterior leg. Greenish drainage suggestive of gram-negative/Pseudomonas infection. The entire mid part of her leg is erythematous I think most of this is stasis dermatitis rather than cellulitis. She has also a bothersome ischemic-looking right fourth toe which I noticed today without her really complaining. She has not been systemically unwell although she looks more physically frail 12/27; I reached out to vein and vascular last weekend Dr. [name] will be seeing her on Thursday. She has a gangrenous area on the tip of the right fourth toe. Multiple areas breaking down. I think this is a combination of arterial and severe venous insufficiency she has multiple wounds in the right leg but not a lot of pain. She has 3 small areas on the left leg we have been using Santyl. On the right leg we have been using silver alginate or at least that is what they have been using Culture I did last week showed abundant is Pseudomonas aeruginosa had a few Enterococcus faecalis. I have been using Ceftin here predominantly because of the green drainage suggesting Pseudomonas I have not yet addressed the Enterococcus which was ampicillin sensitive 1/7; the patient was kindly seen by vein and vascular. She underwent a repeat  ANGIOGRAM on 01/02/2021 by Dr. Donzetta Matters. She appears to have had bilateral common femoral treatments in the past. The right side is heavily heavily calcified and she has 1 area of 50% stenosis in the SFA that is nonflow limiting. She has dominant runoff via the anterior tibial and peroneal arteries. On the left side she also has a heavily calcified SFA no flow-limiting stenosis in the popliteal artery then she she then has runoff via the anterior tibial and posterior tibial arteries without flow-limiting stenosis. She was not felt to require any intervention. She continues to have pain in the right leg with erythema although the erythema is somewhat better. Green drainage coming from the wounds on the posterior lateral right leg which are clearly necrotic. I have had her on 2-1/2 weeks of cefdinir this is while we awaited further arterial evaluation which is completed. I have now turned some thought to the status of her venous system particularly the DVT she had in November. This was an acute DVT involving the right distal femoral and right popliteal veins. She was then started on Xarelto in combination with Plavix but she required hospitalization in November with GI bleeding. She had an endoscopy by Dr. Lizbeth Bark of GI. This showed moderately severe esophagitis a combination of both food particles reflux and Candida. Localized mild inflammation characterized by erythema was found in the cardiac gastric fundus and the gastric body. The duodenal bulb's in the stages of the duodenum were normal. She then underwent a capsule endoscopy which showed evidence of oozing blood from the esophagus but no evidence of small bowel bleeding. She was not restarted on her Xarelto, she continues on Plavix and she has an IVC filter 01/12/2021; we brought this patient in for a nurse visit today however I saw her because of copious amounts of green drainage. Under 3 layer compression the degree of erythema in her lower leg on  the right was better however there is indeed large amounts of green drainage I think coming out of the large wound on the posterior lateral leg. There is also a satellite area above this. She complains of fairly constant pain in fact she called our clinic earlier in the week to report that Sorbact which we are using topically was hurting her. We explained that we had never heard Sorbact do this. She has not been systemically unwell. My thoughts are on this lady that she had recent angiograms and there is nothing further that can be done. She has a gangrenous right fourth toe but there should be enough blood flow to heal wounds on her legs. She did not have another intervention. She is on Plavix with an IVC filter for her previous stents. She had an acute DVT in November I believe and was on Xarelto and Plavix but had a GI bleed of I think esophageal source. She has some swelling in the right thigh marked erythema and swelling in the right mid calf. I wonder whether she is extended the clot burden in her thigh as a cause of the distal DVT like symptoms. 1/18; the patient's repeat duplex ultrasound did not show extension of her DVT in fact this had recanalized and was better. At my request she is seeing Dr. Oneida Alar on Thursday to look over the recent angiogram in the ischemic looking areas in her right fourth and today her right second toe. This foot is cold and I wonder whether she either has microvascular disease and/or severe Raynaud's on top of her known macrovascular disease although her recent angiogram did not show anything that needed a particular procedure. The other issue has been copious amounts of drainage coming out of these large necrotic wounds on her right lateral calf. This is almost a jelly green color and consistency. PCR culture I did last week showed high amounts of Pseudomonas which was hardly surprising but also high amounts of Enterococcus faecalis. She is allergic to penicillin,  Levaquin and sulfa. There is not a good oral combination of a single drug that would cover this. I am going to ask for infectious disease. About the only positive thing I can say about the right leg as she is not in as much pain as she was several weeks ago. I gave her a prolonged course of cefdinir orally and have increased her compression. The leg is certainly less red and less tender but she is beginning develop ischemic changes in her right forefoot Objective Constitutional Patient is hypertensive.. Pulse regular and within target range for patient.Marland Kitchen Respirations regular, non-labored and within target range.. Temperature is normal and within the target range for the patient.Marland Kitchen Appears in no distress. Vitals Time Taken: 11:05 AM, Height: 68 in, Source: Stated, Weight: 132 lbs, Source: Stated, BMI: 20.1, Temperature: 98.3 F, Pulse: 90 bpm, Respiratory Rate: 18 breaths/min, Blood Pressure: 164/72 mmHg. Cardiovascular I cannot feel pulses on the right foot. Forefoot is cold. General Notes: Wound exam oo Right side the copious green drainage noted. Large necrotic wound on the right lateral calf. Innumerable small breaks in the skin anteriorly. She has 2 ischemic areas on the right fourth and right second toes. The right second toe looks is new. She also has medial and anterior wounds on the right leg oo On the left she has the same areas over the left ankle left lateral foot Integumentary (Hair, Skin) Wound #16 status is Open. Original cause of wound was Pressure Injury. The wound  is located on the Right Calcaneus. The wound measures 0.5cm length x 0.9cm width x 0.5cm depth; 0.353cm^2 area and 0.177cm^3 volume. There is Fat Layer (Subcutaneous Tissue) exposed. There is no tunneling or undermining noted. There is a small amount of serous drainage noted. The wound margin is epibole. There is no granulation within the wound bed. There is a large (67-100%) amount of necrotic tissue within the wound  bed including Adherent Slough. Wound #19 status is Open. Original cause of wound was Gradually Appeared. The wound is located on the Right,Lateral Malleolus. The wound measures 2cm length x 0.5cm width x 0.1cm depth; 0.785cm^2 area and 0.079cm^3 volume. There is Fat Layer (Subcutaneous Tissue) exposed. There is no tunneling or undermining noted. There is a large amount of purulent drainage noted. The wound margin is distinct with the outline attached to the wound base. There is small (1-33%) pink granulation within the wound bed. There is a large (67-100%) amount of necrotic tissue within the wound bed including Adherent Slough. General Notes: excoriated periwound, bright green drainage Wound #20 status is Open. Original cause of wound was Gradually Appeared. The wound is located on the Left,Lateral Malleolus. The wound measures 0.7cm length x 0.5cm width x 0.2cm depth; 0.275cm^2 area and 0.055cm^3 volume. There is Fat Layer (Subcutaneous Tissue) exposed. There is no tunneling or undermining noted. There is a small amount of purulent drainage noted. The wound margin is flat and intact. There is no granulation within the wound bed. There is a large (67-100%) amount of necrotic tissue within the wound bed including Adherent Slough. Wound #23 status is Open. Original cause of wound was Gradually Appeared. The wound is located on the Right,Posterior Lower Leg. The wound measures 9.5cm length x 10cm width x 0.5cm depth; 74.613cm^2 area and 37.306cm^3 volume. There is muscle and Fat Layer (Subcutaneous Tissue) exposed. There is no tunneling or undermining noted. There is a large amount of purulent drainage noted. The wound margin is distinct with the outline attached to the wound base. There is no granulation within the wound bed. There is a large (67-100%) amount of necrotic tissue within the wound bed including Eschar, Adherent Slough and Necrosis of Muscle. General Notes: excoriated periwound, bright  green drainage, calcium deposits present in wound margin Wound #24 status is Open. Original cause of wound was Gradually Appeared. The wound is located on the Right,Anterior Lower Leg. The wound measures 1.5cm length x 0.9cm width x 0.2cm depth; 1.06cm^2 area and 0.212cm^3 volume. There is Fat Layer (Subcutaneous Tissue) exposed. There is no tunneling or undermining noted. There is a medium amount of purulent drainage noted. The wound margin is flat and intact. There is no granulation within the wound bed. There is a large (67-100%) amount of necrotic tissue within the wound bed including Adherent Slough. Wound #25 status is Open. Original cause of wound was Gradually Appeared. The wound is located on the Left,Lateral Foot. The wound measures 1cm length x 0.6cm width x 0.1cm depth; 0.471cm^2 area and 0.047cm^3 volume. There is Fat Layer (Subcutaneous Tissue) exposed. There is no tunneling or undermining noted. There is a small amount of purulent drainage noted. The wound margin is flat and intact. There is no granulation within the wound bed. There is a large (67-100%) amount of necrotic tissue within the wound bed including Adherent Slough. Wound #26 status is Open. Original cause of wound was Gradually Appeared. The wound is located on the Right,Lateral Lower Leg. The wound measures 15cm length x 1.9cm width x 0.1cm  depth; 22.384cm^2 area and 2.238cm^3 volume. There is Fat Layer (Subcutaneous Tissue) exposed. There is no tunneling or undermining noted. There is a large amount of purulent drainage noted. Foul odor after cleansing was noted. The wound margin is flat and intact. There is no granulation within the wound bed. There is a large (67-100%) amount of necrotic tissue within the wound bed including Adherent Slough. General Notes: excoriated periwound, bright green drainage Wound #27 status is Open. Original cause of wound was Gradually Appeared. The wound is located on the Right,Distal,Anterior  Lower Leg. The wound measures 1cm length x 1cm width x 0.1cm depth; 0.785cm^2 area and 0.079cm^3 volume. There is Fat Layer (Subcutaneous Tissue) exposed. There is no tunneling or undermining noted. There is a medium amount of purulent drainage noted. The wound margin is flat and intact. There is no granulation within the wound bed. There is a large (67-100%) amount of necrotic tissue within the wound bed including Adherent Slough. General Notes: excoriated periwound, bright green drainage Wound #28 status is Healed - Epithelialized. Original cause of wound was Trauma. The wound is located on the Left,Lateral Lower Leg. The wound measures 0cm length x 0cm width x 0cm depth; 0cm^2 area and 0cm^3 volume. There is no tunneling or undermining noted. There is a none present amount of drainage noted. The wound margin is flat and intact. There is no granulation within the wound bed. There is no necrotic tissue within the wound bed. Wound #29 status is Open. Original cause of wound was Gradually Appeared. The wound is located on the Right,Proximal,Lateral Lower Leg. The wound measures 1.5cm length x 1cm width x 0.1cm depth; 1.178cm^2 area and 0.118cm^3 volume. There is Fat Layer (Subcutaneous Tissue) exposed. There is no tunneling or undermining noted. There is a medium amount of purulent drainage noted. The wound margin is flat and intact. There is no granulation within the wound bed. There is a large (67-100%) amount of necrotic tissue within the wound bed including Adherent Slough. General Notes: excoriated periwound, bright green drainage Wound #30 status is Open. Original cause of wound was Gradually Appeared. The wound is located on the Right T Fourth. The wound measures 1.5cm length oe x 2.8cm width x 0.1cm depth; 3.299cm^2 area and 0.33cm^3 volume. There is Fat Layer (Subcutaneous Tissue) exposed. There is no tunneling or undermining noted. There is a small amount of purulent drainage noted. The  wound margin is flat and intact. There is no granulation within the wound bed. There is a large (67-100%) amount of necrotic tissue within the wound bed including Eschar and Adherent Slough. Wound #31 status is Open. Original cause of wound was Gradually Appeared. The wound is located on the Left,Medial Ankle. The wound measures 0.3cm length x 0.9cm width x 0.1cm depth; 0.212cm^2 area and 0.021cm^3 volume. There is Fat Layer (Subcutaneous Tissue) exposed. There is no tunneling or undermining noted. There is a small amount of serous drainage noted. The wound margin is fibrotic, thickened scar. There is large (67-100%) red granulation within the wound bed. There is a small (1-33%) amount of necrotic tissue within the wound bed including Adherent Slough. Wound #32 status is Open. Original cause of wound was Gradually Appeared. The wound is located on the Right T Great. The wound measures 1.5cm length oe x 1.5cm width x 0.1cm depth; 1.767cm^2 area and 0.177cm^3 volume. There is Fat Layer (Subcutaneous Tissue) exposed. There is no tunneling or undermining noted. There is a medium amount of purulent drainage noted. The wound margin is  flat and intact. There is no granulation within the wound bed. There is a large (67-100%) amount of necrotic tissue within the wound bed including Adherent Slough. Wound #33 status is Open. Original cause of wound was Pressure Injury. The wound is located on the Right T Second. The wound measures 0.4cm length x oe 0.4cm width x 0.1cm depth; 0.126cm^2 area and 0.013cm^3 volume. There is Fat Layer (Subcutaneous Tissue) exposed. There is no tunneling or undermining noted. There is a small amount of serous drainage noted. The wound margin is flat and intact. There is small (1-33%) pink granulation within the wound bed. There is a large (67-100%) amount of necrotic tissue within the wound bed including Adherent Slough. Wound #5 status is Open. Original cause of wound was Gradually  Appeared. The wound is located on the Right,Medial Lower Leg. The wound measures 2.3cm length x 2.1cm width x 0.1cm depth; 3.793cm^2 area and 0.379cm^3 volume. There is Fat Layer (Subcutaneous Tissue) exposed. There is no tunneling or undermining noted. There is a medium amount of purulent drainage noted. The wound margin is flat and intact. There is small (1-33%) pink granulation within the wound bed. There is a large (67-100%) amount of necrotic tissue within the wound bed including Adherent Slough. 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 There was a Excisional Skin/Subcutaneous Tissue Debridement with a total area of 38 sq cm performed by Ricard Dillon., MD. With the following instrument(s): Blade, Curette, and Forceps to remove Viable and Non-Viable tissue/material. Material removed includes Subcutaneous Tissue, Slough, and Skin: Dermis after achieving pain control using Other (Benzocaine). No specimens were taken. A time out was conducted at 11:55, prior to the start of the procedure. A Minimum amount of bleeding  was controlled with Pressure. The procedure was tolerated well with a pain level of 0 throughout and a pain level of 0 following the procedure. Post Debridement Measurements: 9.5cm length x 10cm width x 0.5cm depth; 37.306cm^3 volume. Character of Wound/Ulcer Post Debridement is improved. Severity of Tissue Post Debridement is: Fat layer exposed. Post procedure Diagnosis Wound #23: Same as Pre-Procedure Plan Follow-up Appointments: Return Appointment in 1 week. Nurse Visit: - on Thursday Bathing/ Shower/ Hygiene: May shower and wash wound with soap and water. - on days that dressing is changed Edema Control - Lymphedema / SCD / Other: Elevate legs to the level of the heart or above for 30 minutes daily and/or when sitting, a frequency of: - throughout the day Avoid standing for long periods of time. Exercise regularly Additional Orders / Instructions: Other: - Refer to Dr. Graylon Good (ID) today Other: - Send PCR culture to [facility] WOUND #16: - Calcaneus Wound Laterality: Right Cleanser: Soap and Water Every Other Day/30  Days Discharge Instructions: May shower and wash wound with dial antibacterial soap and water prior to dressing change. Peri-Wound Care: Sween Lotion (Moisturizing lotion) Every Other Day/30 Days Discharge Instructions: Apply moisturizing lotion as directed Topical: Gentamicin Every Other Day/30 Days Discharge Instructions: As directed by physician Topical: Keystone antibiotic Every Other Day/30 Days Discharge Instructions: Once received, stop the gentamicin and Apply Keystone under the calcium alginate with silver. Prim Dressing: KerraCel Ag Gelling Fiber Dressing, 4x5 in (silver alginate) (DME) (Generic) Every Other Day/30 Days ary Discharge Instructions: Apply silver alginate to wound bed as instructed Secondary Dressing: Woven Gauze Sponge, Non-Sterile 4x4 in (DME) (Generic) Every Other Day/30 Days Discharge Instructions: Apply over primary dressing as  directed. Secondary Dressing: ABD Pad, 8x10 (DME) Every Other Day/30 Days Discharge Instructions: Apply over primary dressing as directed. Com pression Wrap: Kerlix Roll 4.5x3.1 (in/yd) (DME) (Generic) Every Other Day/30 Days Discharge Instructions: Apply Kerlix and Coban compression as directed. Com pression Wrap: Coban Self-Adherent Wrap 4x5 (in/yd) (DME) (Generic) Every Other Day/30 Days Discharge Instructions: Apply over Kerlix as directed. WOUND #19: - Malleolus Wound Laterality: Right, Lateral Cleanser: Soap and Water Every Other Day/30 Days Discharge Instructions: May shower and wash wound with dial antibacterial soap and water prior to dressing change. Peri-Wound Care: Sween Lotion (Moisturizing lotion) Every Other Day/30 Days Discharge Instructions: Apply moisturizing lotion as directed Topical: Gentamicin Every Other Day/30 Days Discharge Instructions: As directed by physician Topical: Keystone antibiotic Every Other Day/30 Days Discharge Instructions: Once received, stop the gentamicin and Apply Keystone under the calcium alginate with silver. Prim Dressing: KerraCel Ag Gelling Fiber Dressing, 4x5 in (silver alginate) (DME) (Generic) Every Other Day/30 Days ary Discharge Instructions: Apply silver alginate to wound bed as instructed Secondary Dressing: Woven Gauze Sponge, Non-Sterile 4x4 in (DME) (Generic) Every Other Day/30 Days Discharge Instructions: Apply over primary dressing as directed. Secondary Dressing: ABD Pad, 8x10 (DME) Every Other Day/30 Days Discharge Instructions: Apply over primary dressing as directed. Com pression Wrap: Kerlix Roll 4.5x3.1 (in/yd) (DME) (Generic) Every Other Day/30 Days Discharge Instructions: Apply Kerlix and Coban compression as directed. Com pression Wrap: Coban Self-Adherent Wrap 4x5 (in/yd) (DME) (Generic) Every Other Day/30 Days Discharge Instructions: Apply over Kerlix as directed. WOUND #20: - Malleolus Wound Laterality: Left,  Lateral Cleanser: Soap and Water 1 x Per Week/7 Days Discharge Instructions: May shower and wash wound with dial antibacterial soap and water prior to dressing change. Peri-Wound Care: Sween Lotion (Moisturizing lotion) 1 x Per Week/7 Days Discharge Instructions: Apply moisturizing lotion as directed Prim Dressing: Cutimed Sorbact Swab (DME) (Generic) 1 x Per Week/7 Days ary Discharge Instructions: Apply to wound bed as instructed Secondary Dressing: Woven Gauze Sponge, Non-Sterile 4x4 in (DME) (Generic) 1 x Per Week/7 Days Discharge Instructions: Apply over primary dressing as directed. Secondary Dressing: ABD Pad, 8x10 (DME) 1 x Per Week/7 Days Discharge Instructions: Apply over primary dressing as directed. Com pression Wrap: Kerlix Roll 4.5x3.1 (in/yd) (DME) (Generic) 1 x Per Week/7 Days Discharge Instructions: Apply Kerlix and Coban compression as directed. Com pression Wrap: Coban Self-Adherent Wrap 4x5 (in/yd) (DME) (Generic) 1 x Per Week/7 Days Discharge Instructions: Apply over Kerlix as directed. WOUND #23: - Lower Leg Wound Laterality: Right, Posterior Cleanser: Soap and Water Every Other Day/30 Days Discharge Instructions: May shower and wash wound with dial antibacterial soap and water prior to dressing change. Peri-Wound Care: Sween Lotion (Moisturizing lotion) Every Other Day/30 Days Discharge Instructions: Apply moisturizing lotion as directed Topical: Gentamicin Every Other Day/30 Days Discharge Instructions: As directed  by physician Topical: Keystone antibiotic Every Other Day/30 Days Discharge Instructions: Once received, stop the gentamicin and Apply Keystone under the calcium alginate with silver. Prim Dressing: KerraCel Ag Gelling Fiber Dressing, 4x5 in (silver alginate) (DME) (Generic) Every Other Day/30 Days ary Discharge Instructions: Apply silver alginate to wound bed as instructed Secondary Dressing: Woven Gauze Sponge, Non-Sterile 4x4 in (DME) (Generic) Every  Other Day/30 Days Discharge Instructions: Apply over primary dressing as directed. Secondary Dressing: ABD Pad, 8x10 (DME) Every Other Day/30 Days Discharge Instructions: Apply over primary dressing as directed. Com pression Wrap: Kerlix Roll 4.5x3.1 (in/yd) (DME) (Generic) Every Other Day/30 Days Discharge Instructions: Apply Kerlix and Coban compression as directed. Com pression Wrap: Coban Self-Adherent Wrap 4x5 (in/yd) (DME) (Generic) Every Other Day/30 Days Discharge Instructions: Apply over Kerlix as directed. WOUND #24: - Lower Leg Wound Laterality: Right, Anterior Cleanser: Soap and Water Every Other Day/30 Days Discharge Instructions: May shower and wash wound with dial antibacterial soap and water prior to dressing change. Peri-Wound Care: Sween Lotion (Moisturizing lotion) Every Other Day/30 Days Discharge Instructions: Apply moisturizing lotion as directed Topical: Gentamicin Every Other Day/30 Days Discharge Instructions: As directed by physician Topical: Keystone antibiotic Every Other Day/30 Days Discharge Instructions: Once received, stop the gentamicin and Apply Keystone under the calcium alginate with silver. Prim Dressing: KerraCel Ag Gelling Fiber Dressing, 4x5 in (silver alginate) (DME) (Generic) Every Other Day/30 Days ary Discharge Instructions: Apply silver alginate to wound bed as instructed Secondary Dressing: Woven Gauze Sponge, Non-Sterile 4x4 in (DME) (Generic) Every Other Day/30 Days Discharge Instructions: Apply over primary dressing as directed. Secondary Dressing: ABD Pad, 8x10 (DME) Every Other Day/30 Days Discharge Instructions: Apply over primary dressing as directed. Com pression Wrap: Kerlix Roll 4.5x3.1 (in/yd) (DME) (Generic) Every Other Day/30 Days Discharge Instructions: Apply Kerlix and Coban compression as directed. Com pression Wrap: Coban Self-Adherent Wrap 4x5 (in/yd) (DME) (Generic) Every Other Day/30 Days Discharge Instructions: Apply over  Kerlix as directed. WOUND #25: - Foot Wound Laterality: Left, Lateral Cleanser: Soap and Water 1 x Per Week/7 Days Discharge Instructions: May shower and wash wound with dial antibacterial soap and water prior to dressing change. Peri-Wound Care: Sween Lotion (Moisturizing lotion) 1 x Per Week/7 Days Discharge Instructions: Apply moisturizing lotion as directed Prim Dressing: Cutimed Sorbact Swab (DME) (Generic) 1 x Per Week/7 Days ary Discharge Instructions: Apply to wound bed as instructed Secondary Dressing: Woven Gauze Sponge, Non-Sterile 4x4 in (DME) (Generic) 1 x Per Week/7 Days Discharge Instructions: Apply over primary dressing as directed. Secondary Dressing: ABD Pad, 8x10 (DME) 1 x Per Week/7 Days Discharge Instructions: Apply over primary dressing as directed. Com pression Wrap: Kerlix Roll 4.5x3.1 (in/yd) (DME) (Generic) 1 x Per Week/7 Days Discharge Instructions: Apply Kerlix and Coban compression as directed. Com pression Wrap: Coban Self-Adherent Wrap 4x5 (in/yd) (DME) (Generic) 1 x Per Week/7 Days Discharge Instructions: Apply over Kerlix as directed. WOUND #26: - Lower Leg Wound Laterality: Right, Lateral Cleanser: Soap and Water Every Other Day/30 Days Discharge Instructions: May shower and wash wound with dial antibacterial soap and water prior to dressing change. Peri-Wound Care: Sween Lotion (Moisturizing lotion) Every Other Day/30 Days Discharge Instructions: Apply moisturizing lotion as directed Topical: Gentamicin Every Other Day/30 Days Discharge Instructions: As directed by physician Topical: Keystone antibiotic Every Other Day/30 Days Discharge Instructions: Once received, stop the gentamicin and Apply Keystone under the calcium alginate with silver. Prim Dressing: KerraCel Ag Gelling Fiber Dressing, 4x5 in (silver alginate) (DME) (Generic) Every Other Day/30 Days ary Discharge  Instructions: Apply silver alginate to wound bed as instructed Secondary Dressing:  Woven Gauze Sponge, Non-Sterile 4x4 in (DME) (Generic) Every Other Day/30 Days Discharge Instructions: Apply over primary dressing as directed. Secondary Dressing: ABD Pad, 8x10 (DME) Every Other Day/30 Days Discharge Instructions: Apply over primary dressing as directed. Com pression Wrap: Kerlix Roll 4.5x3.1 (in/yd) (DME) (Generic) Every Other Day/30 Days Discharge Instructions: Apply Kerlix and Coban compression as directed. Com pression Wrap: Coban Self-Adherent Wrap 4x5 (in/yd) (DME) (Generic) Every Other Day/30 Days Discharge Instructions: Apply over Kerlix as directed. WOUND #27: - Lower Leg Wound Laterality: Right, Anterior, Distal Cleanser: Soap and Water Every Other Day/30 Days Discharge Instructions: May shower and wash wound with dial antibacterial soap and water prior to dressing change. Peri-Wound Care: Sween Lotion (Moisturizing lotion) Every Other Day/30 Days Discharge Instructions: Apply moisturizing lotion as directed Topical: Gentamicin Every Other Day/30 Days Discharge Instructions: As directed by physician Topical: Keystone antibiotic Every Other Day/30 Days Discharge Instructions: Once received, stop the gentamicin and Apply Keystone under the calcium alginate with silver. Prim Dressing: KerraCel Ag Gelling Fiber Dressing, 4x5 in (silver alginate) (DME) (Generic) Every Other Day/30 Days ary Discharge Instructions: Apply silver alginate to wound bed as instructed Secondary Dressing: Woven Gauze Sponge, Non-Sterile 4x4 in (DME) (Generic) Every Other Day/30 Days Discharge Instructions: Apply over primary dressing as directed. Secondary Dressing: ABD Pad, 8x10 (DME) Every Other Day/30 Days Discharge Instructions: Apply over primary dressing as directed. Com pression Wrap: Kerlix Roll 4.5x3.1 (in/yd) (DME) (Generic) Every Other Day/30 Days Discharge Instructions: Apply Kerlix and Coban compression as directed. Com pression Wrap: Coban Self-Adherent Wrap 4x5 (in/yd) (DME)  (Generic) Every Other Day/30 Days Discharge Instructions: Apply over Kerlix as directed. WOUND #29: - Lower Leg Wound Laterality: Right, Lateral, Proximal Cleanser: Soap and Water Every Other Day/30 Days Discharge Instructions: May shower and wash wound with dial antibacterial soap and water prior to dressing change. Peri-Wound Care: Sween Lotion (Moisturizing lotion) Every Other Day/30 Days Discharge Instructions: Apply moisturizing lotion as directed Topical: Gentamicin Every Other Day/30 Days Discharge Instructions: As directed by physician Topical: Keystone antibiotic Every Other Day/30 Days Discharge Instructions: Once received, stop the gentamicin and Apply Keystone under the calcium alginate with silver. Prim Dressing: KerraCel Ag Gelling Fiber Dressing, 4x5 in (silver alginate) (DME) (Generic) Every Other Day/30 Days ary Discharge Instructions: Apply silver alginate to wound bed as instructed Secondary Dressing: Woven Gauze Sponge, Non-Sterile 4x4 in (DME) (Generic) Every Other Day/30 Days Discharge Instructions: Apply over primary dressing as directed. Secondary Dressing: ABD Pad, 8x10 (DME) Every Other Day/30 Days Discharge Instructions: Apply over primary dressing as directed. Com pression Wrap: Kerlix Roll 4.5x3.1 (in/yd) (DME) (Generic) Every Other Day/30 Days Discharge Instructions: Apply Kerlix and Coban compression as directed. Com pression Wrap: Coban Self-Adherent Wrap 4x5 (in/yd) (DME) (Generic) Every Other Day/30 Days Discharge Instructions: Apply over Kerlix as directed. WOUND #30: - T Fourth Wound Laterality: Right oe Cleanser: Soap and Water Every Other Day/30 Days Discharge Instructions: May shower and wash wound with dial antibacterial soap and water prior to dressing change. Peri-Wound Care: Sween Lotion (Moisturizing lotion) Every Other Day/30 Days Discharge Instructions: Apply moisturizing lotion as directed Topical: Gentamicin Every Other Day/30  Days Discharge Instructions: As directed by physician Topical: Keystone antibiotic Every Other Day/30 Days Discharge Instructions: Once received, stop the gentamicin and Apply Keystone under the calcium alginate with silver. Prim Dressing: KerraCel Ag Gelling Fiber Dressing, 4x5 in (silver alginate) (DME) (Generic) Every Other Day/30 Days ary Discharge Instructions: Apply silver alginate  to wound bed as instructed Secondary Dressing: Woven Gauze Sponge, Non-Sterile 4x4 in (DME) (Generic) Every Other Day/30 Days Discharge Instructions: Apply over primary dressing as directed. Secondary Dressing: ABD Pad, 8x10 (DME) Every Other Day/30 Days Discharge Instructions: Apply over primary dressing as directed. Com pression Wrap: Kerlix Roll 4.5x3.1 (in/yd) (DME) (Generic) Every Other Day/30 Days Discharge Instructions: Apply Kerlix and Coban compression as directed. Com pression Wrap: Coban Self-Adherent Wrap 4x5 (in/yd) (DME) (Generic) Every Other Day/30 Days Discharge Instructions: Apply over Kerlix as directed. WOUND #31: - Ankle Wound Laterality: Left, Medial Cleanser: Soap and Water 1 x Per Week/7 Days Discharge Instructions: May shower and wash wound with dial antibacterial soap and water prior to dressing change. Peri-Wound Care: Sween Lotion (Moisturizing lotion) 1 x Per Week/7 Days Discharge Instructions: Apply moisturizing lotion as directed Prim Dressing: Cutimed Sorbact Swab (DME) (Generic) 1 x Per Week/7 Days ary Discharge Instructions: Apply to wound bed as instructed Secondary Dressing: Woven Gauze Sponge, Non-Sterile 4x4 in (DME) (Generic) 1 x Per Week/7 Days Discharge Instructions: Apply over primary dressing as directed. Secondary Dressing: ABD Pad, 8x10 (DME) 1 x Per Week/7 Days Discharge Instructions: Apply over primary dressing as directed. Com pression Wrap: Kerlix Roll 4.5x3.1 (in/yd) (DME) (Generic) 1 x Per Week/7 Days Discharge Instructions: Apply Kerlix and Coban  compression as directed. Com pression Wrap: Coban Self-Adherent Wrap 4x5 (in/yd) (DME) (Generic) 1 x Per Week/7 Days Discharge Instructions: Apply over Kerlix as directed. WOUND #32: - T Great Wound Laterality: Right oe Cleanser: Soap and Water Every Other Day/30 Days Discharge Instructions: May shower and wash wound with dial antibacterial soap and water prior to dressing change. Peri-Wound Care: Sween Lotion (Moisturizing lotion) Every Other Day/30 Days Discharge Instructions: Apply moisturizing lotion as directed Topical: Gentamicin Every Other Day/30 Days Discharge Instructions: As directed by physician Topical: Keystone antibiotic Every Other Day/30 Days Discharge Instructions: Once received, stop the gentamicin and Apply Keystone under the calcium alginate with silver. Prim Dressing: KerraCel Ag Gelling Fiber Dressing, 4x5 in (silver alginate) (DME) (Generic) Every Other Day/30 Days ary Discharge Instructions: Apply silver alginate to wound bed as instructed Secondary Dressing: Woven Gauze Sponge, Non-Sterile 4x4 in (DME) (Generic) Every Other Day/30 Days Discharge Instructions: Apply over primary dressing as directed. Secondary Dressing: ABD Pad, 8x10 (DME) Every Other Day/30 Days Discharge Instructions: Apply over primary dressing as directed. Com pression Wrap: Kerlix Roll 4.5x3.1 (in/yd) (DME) (Generic) Every Other Day/30 Days Discharge Instructions: Apply Kerlix and Coban compression as directed. Com pression Wrap: Coban Self-Adherent Wrap 4x5 (in/yd) (DME) (Generic) Every Other Day/30 Days Discharge Instructions: Apply over Kerlix as directed. WOUND #33: - T Second Wound Laterality: Right oe Cleanser: Soap and Water Every Other Day/30 Days Discharge Instructions: May shower and wash wound with dial antibacterial soap and water prior to dressing change. Peri-Wound Care: Sween Lotion (Moisturizing lotion) Every Other Day/30 Days Discharge Instructions: Apply moisturizing lotion  as directed Topical: Gentamicin Every Other Day/30 Days Discharge Instructions: As directed by physician Topical: Keystone antibiotic Every Other Day/30 Days Discharge Instructions: Once received, stop the gentamicin and Apply Keystone under the calcium alginate with silver. Prim Dressing: KerraCel Ag Gelling Fiber Dressing, 4x5 in (silver alginate) (DME) (Generic) Every Other Day/30 Days ary Discharge Instructions: Apply silver alginate to wound bed as instructed Secondary Dressing: Woven Gauze Sponge, Non-Sterile 4x4 in (DME) (Generic) Every Other Day/30 Days Discharge Instructions: Apply over primary dressing as directed. Secondary Dressing: ABD Pad, 8x10 (DME) Every Other Day/30 Days Discharge Instructions: Apply over primary dressing as  directed. Com pression Wrap: Kerlix Roll 4.5x3.1 (in/yd) (DME) (Generic) Every Other Day/30 Days Discharge Instructions: Apply Kerlix and Coban compression as directed. Com pression Wrap: Coban Self-Adherent Wrap 4x5 (in/yd) (DME) (Generic) Every Other Day/30 Days Discharge Instructions: Apply over Kerlix as directed. WOUND #5: - Lower Leg Wound Laterality: Right, Medial Cleanser: Soap and Water Every Other Day/30 Days Discharge Instructions: May shower and wash wound with dial antibacterial soap and water prior to dressing change. Peri-Wound Care: Sween Lotion (Moisturizing lotion) Every Other Day/30 Days Discharge Instructions: Apply moisturizing lotion as directed Topical: Gentamicin Every Other Day/30 Days Discharge Instructions: As directed by physician Topical: Keystone antibiotic Every Other Day/30 Days Discharge Instructions: Once received, stop the gentamicin and Apply Keystone under the calcium alginate with silver. Prim Dressing: KerraCel Ag Gelling Fiber Dressing, 4x5 in (silver alginate) (DME) (Generic) Every Other Day/30 Days ary Discharge Instructions: Apply silver alginate to wound bed as instructed Secondary Dressing: Woven Gauze  Sponge, Non-Sterile 4x4 in (DME) (Generic) Every Other Day/30 Days Discharge Instructions: Apply over primary dressing as directed. Secondary Dressing: ABD Pad, 8x10 (DME) Every Other Day/30 Days Discharge Instructions: Apply over primary dressing as directed. Compression Wrap: Kerlix Roll 4.5x3.1 (in/yd) (DME) (Generic) Every Other Day/30 Days Discharge Instructions: Apply Kerlix and Coban compression as directed. Compression Wrap: Coban Self-Adherent Wrap 4x5 (in/yd) (DME) (Generic) Every Other Day/30 Days Discharge Instructions: Apply over Kerlix as directed. 1. We are going to continue with the same basic dressings on the right leg gentamicin silver alginate Number to Sorbact on the others 2. Because of the changes in her right foot I am dropping her compression down to kerlix Coban 3. I have reached out to Dr. Oneida Alar to have 1 final look at this. 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 In any case she is not eligible for any further revascularization 4. The DVT test I did last week did not show progression of the clot in her veins in her thigh. I had thought perhaps we would have to anticoagulate her beyond her Plavix but that is not the case 5. She is going to need antibiotics. I am sending her to infectious disease. If we can get in to see Dr. Graylon Good who is her preference, I will text Dr. Graylon Good. 6. I brought up the idea of an amputation to her because I know this patient well enough that I felt I could. She wants to see everybody's opinions which is perfectly reasonable however if the pain escalates it certainly becomes an alternative from a pure wound care point of view. I am uncertain about her cardiopulmonary status vis--vis general anesthesia however 7. The right leg is certainly deteriorating. I put topical gentamicin on the wounds. Were going to send the PCR culture for topical  compounding antibiotics and to see infectious disease about systemic antibiotics. The only thing I could come up with a visit combination of linezolid and perhaps a third generation oral cephalosporin. I wonder whether IV antibiotics might be better for her Electronic Signature(s) Signed: 01/17/2021 5:23:51 PM By: Linton Ham MD Entered By: Linton Ham on 01/17/2021 12:28:13 -------------------------------------------------------------------------------- SuperBill Details Patient Name: Date of Service: [name], Yolanda Manges. 01/17/2021 Medical Record Number: [id number] Patient Account Number: [id number] Date of Birth/Sex: Treating RN: May 22, 1951 (69 y.o. 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 I87.331 Chronic venous hypertension (idiopathic) with ulcer and inflammation of right lower extremity L89.616 Pressure-induced deep tissue damage of right heel L97.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 L97.528 Non-pressure chronic ulcer of other part of left foot with other specified severity I70.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 I70.248 Atherosclerosis of native arteries of left leg with ulceration of other part of lower leg Facility Procedures CPT4 Code: [id number] Description: 04591 - DEB SUBQ TISSUE 20 SQ CM/< ICD-10 Diagnosis Description L97.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 L97.811 Non-pressure chronic ulcer of other part of  right lower leg limited to breakdow Modifier: n of skin Quantity: 1 CPT4 Code: [id number] Description: 41443 - DEB SUBQ TISS EA ADDL 20CM ICD-10 Diagnosis Description L97.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 L97.811 Non-pressure chronic ulcer of other part of right lower leg limited to breakdow Modifier: n of skin Quantity: 1 Physician Procedures : CPT4 Code Description Modifier [id number] 11042 - WC PHYS SUBQ TISS 20 SQ CM 1 ICD-10 Diagnosis Description L97.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 L97.811 Non-pressure chronic ulcer of other part of right lower leg  limited to breakdown of skin Quantity: : [id number] 11045 - WC PHYS SUBQ TISS EA ADDL 20 CM 1 ICD-10 Diagnosis Description L97.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

## 2021-01-18 NOTE — Telephone Encounter (Signed)
Placed patient on [facility] wait list. Wait List ID: [id number]

## 2021-01-18 NOTE — Progress Notes (Signed)
[name] [name], [name] [name] ([id number]) Visit Report for 01/17/2021 Arrival Information Details Patient Name: Date of Service: [name] 01/17/2021 10:45 A M Medical Record Number: [id number] Patient Account Number: [id number] Date of Birth/Sex: Treating RN: [birth year] (70 y.o. [name] [name], [name] Primary Care [name] [name]: [name] Other Clinician: Referring Lala Been: Treating [name] [name]/Extender: [name] in Treatment: 5 Visit Information History Since Last Visit Added or deleted any medications: No Patient Arrived: Wheel Chair Any new allergies or adverse reactions: No Arrival Time: 10:59 Had a fall or experienced change in No Accompanied By: spouse activities of daily living that may affect Transfer Assistance: None risk of falls: Patient Identification Verified: Yes Signs or symptoms of abuse/neglect since last visito No Secondary Verification Process Completed: Yes Hospitalized since last visit: No Patient Requires Transmission-Based Precautions: No Implantable device outside of the clinic excluding No Patient Has Alerts: Yes cellular tissue based products placed in the center Patient Alerts: R ABI: [place] TBI: 0.41 since last visit: L ABI: [place] (07/2020) Has Dressing in Place as Prescribed: Yes Has Compression in Place as Prescribed: Yes Pain Present Now: Yes Electronic Signature(s) Signed: 01/18/2021 12:00:41 PM By: Baruch Gouty RN, BSN Entered By: Baruch Gouty on 01/17/2021 11:05:20 -------------------------------------------------------------------------------- Lower Extremity Assessment Details Patient Name: Date of Service: [name], [name] 01/17/2021 10:45 A M Medical Record Number: [id number] Patient Account Number: [id number] Date of Birth/Sex: Treating RN: [birth year] (70 y.o. [name] [name] Primary Care Dylan Monforte: [name] Other Clinician: Referring [name] [name]: Treating Oak Dorey/Extender: [name] in  Treatment: 460 Edema Assessment Assessed: [name] [name]: No] [Right: No] Edema: [Left: Yes] [Right: Yes] Calf Left: Right: Point of Measurement: 36 cm From Medial Instep 28.6 cm 33.5 cm Ankle Left: Right: Point of Measurement: 10 cm From Medial Instep 21.8 cm 22 cm Vascular Assessment Pulses: Dorsalis Pedis Palpable: [Left:No] [Right:No] Electronic Signature(s) Signed: 01/18/2021 12:00:41 PM By: Baruch Gouty RN, BSN Entered By: Baruch Gouty on 01/17/2021 11:21:48 -------------------------------------------------------------------------------- [place] Details Patient Name: Date of Service: [name] 01/17/2021 10:45 A M Medical Record Number: [id number] Patient Account Number: [id number] Date of Birth/Sex: Treating RN: 1951/08/01 (70 y.o. [name] [name], [name] [name] Primary Care [name] [name]: [name] Other Clinician: Referring [name] [name]: Treating [name] [name]/Extender: [name] in Treatment: 460 Active Inactive Wound/Skin Impairment Nursing Diagnoses: Impaired tissue integrity Knowledge deficit related to ulceration/compromised skin integrity Goals: Patient/caregiver will verbalize understanding of skin care regimen Date Initiated: 10/11/2016 Target Resolution Date: 02/10/2021 Goal Status: Active Interventions: Assess patient/caregiver ability to obtain necessary supplies Assess patient/caregiver ability to perform ulcer/skin care regimen upon admission and as needed Assess ulceration(s) every visit Provide education on ulcer and skin care Treatment Activities: Skin care regimen initiated : 10/11/2016 Topical wound management initiated : 10/11/2016 Notes: Electronic Signature(s) Signed: 01/18/2021 5:25:13 PM By: [name] Hammock RN Entered By: [name] [name] on 01/17/2021 11:00:46 -------------------------------------------------------------------------------- Pain Assessment Details Patient Name: Date of Service: BLA  [name] 01/17/2021 10:45 A M Medical Record Number: [id number] Patient Account Number: [id number] Date of Birth/Sex: Treating RN: 03-Oct-1951 (70 y.o. [name] [name] Primary Care [name] [name]: [name] Other Clinician: Referring Abie Cheek: Treating [name] [name]/Extender: [name] in Treatment: 460 Active Problems Location of Pain Severity and Description of Pain Patient Has Paino Yes Site Locations Pain Location: Pain in Ulcers With Dressing Change: Yes Duration of the Pain. Constant / Intermittento Constant Rate the pain. Current Pain Level: 8 Worst Pain  Level: 10 Least Pain Level: 4 Character of Pain Describe the Pain: Aching, Burning Pain Management and Medication Current Pain Management: Medication: Yes Is the Current Pain Management Adequate: Adequate How does your wound impact your activities of daily livingo Sleep: Yes Bathing: No Appetite: No Relationship With Others: No Bladder Continence: No Emotions: Yes Bowel Continence: No Hobbies: No Toileting: No Dressing: No Electronic Signature(s) Signed: 01/18/2021 12:00:41 PM By: Baruch Gouty RN, BSN Entered By: Baruch Gouty on 01/17/2021 11:06:35 -------------------------------------------------------------------------------- Patient/Caregiver Education Details Patient Name: Date of Service: BLA [name] [name] 1/18/2022andnbsp10:45 A M Medical Record Number: [id number] Patient Account Number: [id number] Date of Birth/Gender: Treating RN: [birth year] (70 y.o. Benjaman Lobe Primary Care Physician: [name] Other Clinician: Referring Physician: Treating Physician/Extender: [name] in Treatment: [phone] Education Assessment Education Provided To: Patient Education Topics Provided Wound/Skin Impairment: Methods: Explain/Verbal Responses: State content correctly [employer]) Signed: 01/18/2021 5:25:13 PM By: [name] Hammock  RN Entered By: [name] [name] on 01/17/2021 11:01:10 -------------------------------------------------------------------------------- Wound Assessment Details Patient Name: Date of Service: [name] 01/17/2021 10:45 A M Medical Record Number: [id number] Patient Account Number: [id number] Date of Birth/Sex: Treating RN: [birth year] (70 y.o. [name] [name], [name] Primary Care Paton Crum: [name] Other Clinician: Referring Gaynor Genco: Treating Damon Baisch/Extender: [name] in Treatment: 460 Wound Status Wound Number: 16 Primary Pressure Ulcer Etiology: Wound Location: Right Calcaneus Wound Open Wounding Event: Pressure Injury Status: Date Acquired: 01/04/2020 Comorbid Anemia, Hypertension, Peripheral Arterial Disease, Peripheral Weeks Of Treatment: 54 History: Venous Disease, Raynauds, Scleroderma, Rheumatoid Arthritis, Clustered Wound: No Osteoarthritis Photos Photo Uploaded By: Mikeal Hawthorne on 01/18/2021 11:28:08 Wound Measurements Length: (cm) 0.5 Width: (cm) 0.9 Depth: (cm) 0.5 Area: (cm) 0.353 Volume: (cm) 0.177 % Reduction in Area: 97.2% % Reduction in Volume: 85.9% Epithelialization: Small (1-33%) Tunneling: No Undermining: No Wound Description Classification: Category/Stage III Wound Margin: Epibole Exudate Amount: Small Exudate Type: Serous Exudate Color: amber Foul Odor After Cleansing: No Slough/Fibrino Yes Wound Bed Granulation Amount: None Present (0%) Exposed Structure Necrotic Amount: Large (67-100%) Fascia Exposed: No Necrotic Quality: Adherent Slough Fat Layer (Subcutaneous Tissue) Exposed: Yes Tendon Exposed: No Muscle Exposed: No Joint Exposed: No Bone Exposed: No Electronic Signature(s) Signed: 01/18/2021 12:00:41 PM By: Baruch Gouty RN, BSN Entered By: Baruch Gouty on 01/17/2021 11:27:29 -------------------------------------------------------------------------------- Wound Assessment  Details Patient Name: Date of Service: [name] 01/17/2021 10:45 A M Medical Record Number: [id number] Patient Account Number: [id number] Date of Birth/Sex: Treating RN: 15-Jun-1951 (70 y.o. [name] [name], [name] Primary Care [name] [name]: [name] Other Clinician: Referring Jona Erkkila: Treating Yomara Toothman/Extender: [name] in Treatment: 460 Wound Status Wound Number: 19 Primary Venous Leg Ulcer Etiology: Wound Location: Right, Lateral Malleolus Wound Open Wounding Event: Gradually Appeared Status: Date Acquired: 05/02/2020 Comorbid Anemia, Hypertension, Peripheral Arterial Disease, Peripheral Weeks Of Treatment: 37 History: Venous Disease, Raynauds, Scleroderma, Rheumatoid Arthritis, Clustered Wound: No Osteoarthritis Photos Photo Uploaded By: Mikeal Hawthorne on 01/18/2021 11:28:08 Wound Measurements Length: (cm) 2 Width: (cm) 0.5 Depth: (cm) 0.1 Area: (cm) 0.785 Volume: (cm) 0.079 % Reduction in Area: -456.7% % Reduction in Volume: -182.1% Epithelialization: Small (1-33%) Tunneling: No Undermining: No Wound Description Classification: Full Thickness Without Exposed Support Structures Wound Margin: Distinct, outline attached Exudate Amount: Large Exudate Type: Purulent Exudate Color: yellow, brown, green Foul Odor After Cleansing: No Slough/Fibrino Yes Wound Bed Granulation Amount: Small (1-33%) Exposed Structure Granulation Quality: Pink Fascia Exposed: No Necrotic Amount: Large (67-100%) Fat  Layer (Subcutaneous Tissue) Exposed: Yes Necrotic Quality: Adherent Slough Tendon Exposed: No Muscle Exposed: No Joint Exposed: No Bone Exposed: No Assessment Notes excoriated periwound, bright green drainage Electronic Signature(s) Signed: 01/18/2021 12:00:41 PM By: Baruch Gouty RN, BSN Entered By: Baruch Gouty on 01/17/2021 11:28:47 -------------------------------------------------------------------------------- Wound Assessment  Details Patient Name: Date of Service: [name] 01/17/2021 10:45 A M Medical Record Number: [id number] Patient Account Number: [id number] Date of Birth/Sex: Treating RN: November 28, 1951 (70 y.o. [name] [name], [name] Primary Care Kali Deadwyler: [name] Other Clinician: Referring Muzamil Harker: Treating Vartan Kerins/Extender: [name] in Treatment: 460 Wound Status Wound Number: 20 Primary Arterial Insufficiency Ulcer Etiology: Wound Location: Left, Lateral Malleolus Wound Open Wounding Event: Gradually Appeared Status: Date Acquired: 05/02/2020 Comorbid Anemia, Hypertension, Peripheral Arterial Disease, Peripheral Weeks Of Treatment: 37 History: Venous Disease, Raynauds, Scleroderma, Rheumatoid Arthritis, Clustered Wound: No Osteoarthritis Photos Photo Uploaded By: Mikeal Hawthorne on 01/18/2021 11:25:16 Wound Measurements Length: (cm) 0.7 Width: (cm) 0.5 Depth: (cm) 0.2 Area: (cm) 0.275 Volume: (cm) 0.055 % Reduction in Area: 37.5% % Reduction in Volume: 37.5% Epithelialization: Small (1-33%) Tunneling: No Undermining: No Wound Description Classification: Full Thickness Without Exposed Support Structures Wound Margin: Flat and Intact Exudate Amount: Small Exudate Type: Purulent Exudate Color: yellow, brown, green Foul Odor After Cleansing: No Slough/Fibrino Yes Wound Bed Granulation Amount: None Present (0%) Exposed Structure Necrotic Amount: Large (67-100%) Fascia Exposed: No Necrotic Quality: Adherent Slough Fat Layer (Subcutaneous Tissue) Exposed: Yes Tendon Exposed: No Muscle Exposed: No Joint Exposed: No Bone Exposed: No Electronic Signature(s) Signed: 01/18/2021 12:00:41 PM By: Baruch Gouty RN, BSN Entered By: Baruch Gouty on 01/17/2021 11:29:47 -------------------------------------------------------------------------------- Wound Assessment Details Patient Name: Date of Service: [name] 01/17/2021 10:45 A  M Medical Record Number: [id number] Patient Account Number: [id number] Date of Birth/Sex: Treating RN: 11/26/1951 (70 y.o. [name] [name], [name] Primary Care Victorina Kable: [name] Other Clinician: Referring Sydna Brodowski: Treating Brindy Higginbotham/Extender: [name] in Treatment: 460 Wound Status Wound Number: 23 Primary Venous Leg Ulcer Etiology: Wound Location: Right, Posterior Lower Leg Wound Open Wounding Event: Gradually Appeared Status: Date Acquired: 08/15/2020 Comorbid Anemia, Hypertension, Peripheral Arterial Disease, Peripheral Weeks Of Treatment: 22 History: Venous Disease, Raynauds, Scleroderma, Rheumatoid Arthritis, Clustered Wound: Yes Osteoarthritis Photos Photo Uploaded By: Mikeal Hawthorne on 01/18/2021 14:25:16 Wound Measurements Length: (cm) 9.5 Width: (cm) 10 Depth: (cm) 0.5 Area: (cm) 74.613 Volume: (cm) 37.306 % Reduction in Area: -6544.1% % Reduction in Volume: -33208.9% Epithelialization: None Tunneling: No Undermining: No Wound Description Classification: Full Thickness With Exposed Support Structures Wound Margin: Distinct, outline attached Exudate Amount: Large Exudate Type: Purulent Exudate Color: yellow, brown, green Foul Odor After Cleansing: No Slough/Fibrino Yes Wound Bed Granulation Amount: None Present (0%) Exposed Structure Necrotic Amount: Large (67-100%) Fascia Exposed: No Necrotic Quality: Eschar, Adherent Slough Fat Layer (Subcutaneous Tissue) Exposed: Yes Tendon Exposed: No Muscle Exposed: Yes Necrosis of Muscle: Yes Joint Exposed: No Bone Exposed: No Assessment Notes excoriated periwound, bright green drainage, calcium deposits present in wound margin Electronic Signature(s) Signed: 01/18/2021 12:00:41 PM By: Baruch Gouty RN, BSN Entered By: Baruch Gouty on 01/17/2021 11:38:27 -------------------------------------------------------------------------------- Wound Assessment Details Patient Name: Date  of Service: [name] 01/17/2021 10:45 A M Medical Record Number: [id number] Patient Account Number: [id number] Date of Birth/Sex: Treating RN: [birth year] (70 y.o. [name] [name] Primary Care Marcena Dias: [name] Other Clinician: Referring Cresta Riden: Treating Jalie Eiland/Extender: [name] in Treatment: 460 Wound Status Wound  Number: 24 Primary Venous Leg Ulcer Etiology: Wound Location: Right, Anterior Lower Leg Wound Open Wounding Event: Gradually Appeared Status: Date Acquired: 10/10/2020 Comorbid Anemia, Hypertension, Peripheral Arterial Disease, Peripheral Weeks Of Treatment: 14 History: Venous Disease, Raynauds, Scleroderma, Rheumatoid Arthritis, Clustered Wound: No Osteoarthritis Photos Photo Uploaded By: Mikeal Hawthorne on 01/18/2021 11:52:51 Wound Measurements Length: (cm) 1.5 Width: (cm) 0.9 Depth: (cm) 0.2 Area: (cm) 1.06 Volume: (cm) 0.212 % Reduction in Area: 12.4% % Reduction in Volume: 41.6% Epithelialization: Small (1-33%) Tunneling: No Undermining: No Wound Description Classification: Full Thickness Without Exposed Support Structures Wound Margin: Flat and Intact Exudate Amount: Medium Exudate Type: Purulent Exudate Color: yellow, brown, green Foul Odor After Cleansing: No Slough/Fibrino Yes Wound Bed Granulation Amount: None Present (0%) Exposed Structure Necrotic Amount: Large (67-100%) Fascia Exposed: No Necrotic Quality: Adherent Slough Fat Layer (Subcutaneous Tissue) Exposed: Yes Tendon Exposed: No Muscle Exposed: No Joint Exposed: No Bone Exposed: No Electronic Signature(s) Signed: 01/18/2021 12:00:41 PM By: Baruch Gouty RN, BSN Entered By: Baruch Gouty on 01/17/2021 11:30:28 -------------------------------------------------------------------------------- Wound Assessment Details Patient Name: Date of Service: [name] 01/17/2021 10:45 A M Medical Record Number: [id number] Patient  Account Number: [id number] Date of Birth/Sex: Treating RN: November 26, 1951 (70 y.o. [name] [name], [name] Primary Care Obi Scrima: [name] Other Clinician: Referring Chrishonda Hesch: Treating Braeleigh Pyper/Extender: [name] in Treatment: 460 Wound Status Wound Number: 25 Primary Venous Leg Ulcer Etiology: Wound Location: Left, Lateral Foot Wound Open Wounding Event: Gradually Appeared Status: Date Acquired: 10/10/2020 Comorbid Anemia, Hypertension, Peripheral Arterial Disease, Peripheral Weeks Of Treatment: 14 History: Venous Disease, Raynauds, Scleroderma, Rheumatoid Arthritis, Clustered Wound: No Osteoarthritis Photos Photo Uploaded By: Mikeal Hawthorne on 01/18/2021 11:22:59 Wound Measurements Length: (cm) 1 Width: (cm) 0.6 Depth: (cm) 0.1 Area: (cm) 0.471 Volume: (cm) 0.047 % Reduction in Area: -24.9% % Reduction in Volume: -23.7% Epithelialization: None Tunneling: No Undermining: No Wound Description Classification: Full Thickness Without Exposed Support Structures Wound Margin: Flat and Intact Exudate Amount: Small Exudate Type: Purulent Exudate Color: yellow, brown, green Foul Odor After Cleansing: No Slough/Fibrino Yes Wound Bed Granulation Amount: None Present (0%) Exposed Structure Necrotic Amount: Large (67-100%) Fascia Exposed: No Necrotic Quality: Adherent Slough Fat Layer (Subcutaneous Tissue) Exposed: Yes Tendon Exposed: No Muscle Exposed: No Joint Exposed: No Bone Exposed: No Electronic Signature(s) Signed: 01/18/2021 12:00:41 PM By: Baruch Gouty RN, BSN Entered By: Baruch Gouty on 01/17/2021 11:31:03 -------------------------------------------------------------------------------- Wound Assessment Details Patient Name: Date of Service: [name] 01/17/2021 10:45 A M Medical Record Number: [id number] Patient Account Number: [id number] Date of Birth/Sex: Treating RN: 30-Jul-1951 (70 y.o. [name] [name], [name] Primary Care  Levante Simones: [name] Other Clinician: Referring Scottlyn Mchaney: Treating Ashly Goethe/Extender: [name] in Treatment: 460 Wound Status Wound Number: 26 Primary Venous Leg Ulcer Etiology: Wound Location: Right, Lateral Lower Leg Wound Open Wounding Event: Gradually Appeared Status: Date Acquired: 10/27/2020 Date Acquired: 10/27/2020 Comorbid Anemia, Hypertension, Peripheral Arterial Disease, Peripheral Weeks Of Treatment: 11 History: Venous Disease, Raynauds, Scleroderma, Rheumatoid Arthritis, Clustered Wound: No Osteoarthritis Photos Photo Uploaded By: Mikeal Hawthorne on 01/18/2021 14:24:53 Wound Measurements Length: (cm) 15 Width: (cm) 1.9 Depth: (cm) 0.1 Area: (cm) 22.384 Volume: (cm) 2.238 % Reduction in Area: -4422% % Reduction in Volume: -1412.2% Epithelialization: Small (1-33%) Tunneling: No Undermining: No Wound Description Classification: Full Thickness Without Exposed Support Structures Wound Margin: Flat and Intact Exudate Amount: Large Exudate Type: Purulent Exudate Color: yellow, brown, green Foul Odor After Cleansing: Yes Due to Product Use: No  Slough/Fibrino Yes Wound Bed Granulation Amount: None Present (0%) Exposed Structure Necrotic Amount: Large (67-100%) Fascia Exposed: No Necrotic Quality: Adherent Slough Fat Layer (Subcutaneous Tissue) Exposed: Yes Tendon Exposed: No Muscle Exposed: No Joint Exposed: No Bone Exposed: No Assessment Notes excoriated periwound, bright green drainage Electronic Signature(s) Signed: 01/18/2021 12:00:41 PM By: Baruch Gouty RN, BSN Entered By: Baruch Gouty on 01/17/2021 11:39:16 -------------------------------------------------------------------------------- Wound Assessment Details Patient Name: Date of Service: [name] 01/17/2021 10:45 A M Medical Record Number: [id number] Patient Account Number: [id number] Date of Birth/Sex: Treating RN: 06/20/1951 (70 y.o. [name] [name], [name] Primary Care Lavona Norsworthy: [name] Other Clinician: Referring Nicol Herbig: Treating Neyah Ellerman/Extender: [name] in Treatment: 460 Wound Status Wound Number: 27 Primary Venous Leg Ulcer Etiology: Wound Location: Right, Distal, Anterior Lower Leg Wound Open Wounding Event: Gradually Appeared Status: Date Acquired: 11/07/2020 Comorbid Anemia, Hypertension, Peripheral Arterial Disease, Peripheral Weeks Of Treatment: 10 History: Venous Disease, Raynauds, Scleroderma, Rheumatoid Arthritis, Clustered Wound: No Osteoarthritis Photos Photo Uploaded By: Mikeal Hawthorne on 01/18/2021 11:52:51 Wound Measurements Length: (cm) 1 Width: (cm) 1 Depth: (cm) 0.1 Area: (cm) 0.785 Volume: (cm) 0.079 % Reduction in Area: -177.4% % Reduction in Volume: -182.1% Epithelialization: Small (1-33%) Tunneling: No Undermining: No Wound Description Classification: Full Thickness Without Exposed Support Structures Wound Margin: Flat and Intact Exudate Amount: Medium Exudate Type: Purulent Exudate Color: yellow, brown, green Foul Odor After Cleansing: No Slough/Fibrino Yes Wound Bed Granulation Amount: None Present (0%) Exposed Structure Necrotic Amount: Large (67-100%) Fascia Exposed: No Necrotic Quality: Adherent Slough Fat Layer (Subcutaneous Tissue) Exposed: Yes Tendon Exposed: No Muscle Exposed: No Joint Exposed: No Bone Exposed: No Assessment Notes excoriated periwound, bright green drainage Electronic Signature(s) Signed: 01/18/2021 12:00:41 PM By: Baruch Gouty RN, BSN Entered By: Baruch Gouty on 01/17/2021 11:31:54 -------------------------------------------------------------------------------- Wound Assessment Details Patient Name: Date of Service: [name] 01/17/2021 10:45 A M Medical Record Number: [id number] Patient Account Number: [id number] Date of Birth/Sex: Treating RN: 1951-07-07 (70 y.o. [name] [name], [name] Primary  Care Nishika Parkhurst: [name] Other Clinician: Referring Malikiah Debarr: Treating Mansi Tokar/Extender: [name] in Treatment: 460 Wound Status Wound Number: 28 Primary Arterial Insufficiency Ulcer Etiology: Wound Location: Left, Lateral Lower Leg Wound Healed - Epithelialized Wounding Event: Trauma Status: Date Acquired: 12/09/2020 Comorbid Anemia, Hypertension, Peripheral Arterial Disease, Peripheral Weeks Of Treatment: 4 History: Venous Disease, Raynauds, Scleroderma, Rheumatoid Arthritis, Clustered Wound: No Osteoarthritis Photos Photo Uploaded By: Mikeal Hawthorne on 01/18/2021 11:25:37 Wound Measurements Length: (cm) Width: (cm) Depth: (cm) Area: (cm) Volume: (cm) 0 % Reduction in Area: 100% 0 % Reduction in Volume: 100% 0 Epithelialization: Large (67-100%) 0 Tunneling: No 0 Undermining: No Wound Description Classification: Full Thickness Without Exposed Support Structures Wound Margin: Flat and Intact Exudate Amount: None Present Foul Odor After Cleansing: No Slough/Fibrino No Wound Bed Granulation Amount: None Present (0%) Exposed Structure Necrotic Amount: None Present (0%) Fascia Exposed: No Fat Layer (Subcutaneous Tissue) Exposed: No Tendon Exposed: No Muscle Exposed: No Joint Exposed: No Bone Exposed: No Electronic Signature(s) Signed: 01/18/2021 12:00:41 PM By: Baruch Gouty RN, BSN Entered By: Baruch Gouty on 01/17/2021 11:33:51 -------------------------------------------------------------------------------- Wound Assessment Details Patient Name: Date of Service: [name] 01/17/2021 10:45 A M Medical Record Number: [id number] Patient Account Number: [id number] Date of Birth/Sex: Treating RN: 04-Jul-1951 (70 y.o. [name] [name] Primary Care Zeek Rostron: [name] Other Clinician: Referring Tarry Fountain: Treating Ismeal Heider/Extender: [name] in Treatment: 460 Wound Status  Wound Number: 29  Primary Arterial Insufficiency Ulcer Etiology: Wound Location: Right, Proximal, Lateral Lower Leg Wound Open Wounding Event: Gradually Appeared Status: Date Acquired: 12/14/2020 Comorbid Anemia, Hypertension, Peripheral Arterial Disease, Peripheral Weeks Of Treatment: 4 History: Venous Disease, Raynauds, Scleroderma, Rheumatoid Arthritis, Clustered Wound: No Osteoarthritis Photos Photo Uploaded By: Mikeal Hawthorne on 01/18/2021 14:24:53 Wound Measurements Length: (cm) 1.5 Width: (cm) 1 Depth: (cm) 0.1 Area: (cm) 1.178 Volume: (cm) 0.118 % Reduction in Area: 0% % Reduction in Volume: 83.3% Epithelialization: None Tunneling: No Undermining: No Wound Description Classification: Full Thickness Without Exposed Support Struct Wound Margin: Flat and Intact Exudate Amount: Medium Exudate Type: Purulent Exudate Color: yellow, brown, green ures Foul Odor After Cleansing: No Slough/Fibrino Yes Wound Bed Granulation Amount: None Present (0%) Exposed Structure Necrotic Amount: Large (67-100%) Fascia Exposed: No Necrotic Quality: Adherent Slough Fat Layer (Subcutaneous Tissue) Exposed: Yes Tendon Exposed: No Muscle Exposed: No Joint Exposed: No Bone Exposed: No Assessment Notes excoriated periwound, bright green drainage Electronic Signature(s) Signed: 01/18/2021 12:00:41 PM By: Baruch Gouty RN, BSN Entered By: Baruch Gouty on 01/17/2021 11:39:52 -------------------------------------------------------------------------------- Wound Assessment Details Patient Name: Date of Service: [name] 01/17/2021 10:45 A M Medical Record Number: [id number] Patient Account Number: [id number] Date of Birth/Sex: Treating RN: June 06, 1951 (70 y.o. 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 T Fourth oe Wound Open Wounding Event: Gradually Appeared Status: Date Acquired: 12/26/2020 Comorbid Anemia, Hypertension, Peripheral Arterial Disease, Peripheral Weeks Of Treatment: 3 History: Venous Disease, Raynauds, Scleroderma, Rheumatoid Arthritis, Clustered Wound: No Osteoarthritis Photos Photo Uploaded By: Mikeal Hawthorne on 01/18/2021 14:16:53 Wound Measurements Length: (cm) 1.5 Width: (cm) 2.8 Depth: (cm) 0.1 Area: (cm) 3.299 Volume: (cm) 0.33 % Reduction in Area: 25% % Reduction in Volume: 25% Epithelialization: None Tunneling: No Undermining: No Wound Description Classification: Full Thickness Without Exposed Support Structu Wound Margin: Flat and Intact Exudate Amount: Small Exudate Type: Purulent Exudate Color: yellow, brown, green res Foul Odor After Cleansing: No Slough/Fibrino Yes Wound Bed Granulation Amount: None Present (0%) Exposed Structure Necrotic Amount: Large (67-100%) Fascia Exposed: No Necrotic Quality: Eschar, Adherent Slough Fat Layer (Subcutaneous Tissue) Exposed: Yes Tendon Exposed: No Muscle Exposed: No Joint Exposed: No Bone Exposed: No Electronic Signature(s) Signed: 01/18/2021 12:00:41 PM By: Baruch Gouty RN, BSN Entered By: Baruch Gouty on 01/17/2021 11:23:44 -------------------------------------------------------------------------------- Wound Assessment Details Patient Name: Date of Service: [name] 01/17/2021 10:45 A M Medical Record Number: [id number] Patient Account Number: [id number] Date of Birth/Sex: Treating RN: [birth year] (70 y.o. [name] [name], [name] Primary Care Corwyn Vora: [name] Other Clinician: Referring Ephraim Reichel: Treating Bertrice Leder/Extender: [name] in Treatment: 460 Wound Status Wound Number: 31 Primary Venous Leg Ulcer Etiology: Wound Location: Left, Medial Ankle Wound Open Wounding Event: Gradually  Appeared Status: Date Acquired: 01/09/2021 Comorbid Anemia, Hypertension, Peripheral Arterial Disease, Peripheral Weeks Of Treatment: 1 History: Venous Disease, Raynauds, Scleroderma, Rheumatoid Arthritis, Clustered Wound: No Osteoarthritis Photos Photo Uploaded By: Mikeal Hawthorne on 01/18/2021 11:09:32 Wound Measurements Length: (cm) 0.3 Width: (cm) 0.9 Depth: (cm) 0.1 Area: (cm) 0.212 Volume: (cm) 0.021 % Reduction in Area: 71.9% % Reduction in Volume: 72% Epithelialization: Medium (34-66%) Tunneling: No Undermining: No Wound Description Classification: Full Thickness Without Exposed Support Structu Wound Margin: Fibrotic scar, thickened scar Exudate Amount: Small Exudate Type: Serous Exudate Color: amber res Foul  Odor After Cleansing: No Slough/Fibrino Yes Wound Bed Granulation Amount: Large (67-100%) Exposed Structure Granulation Quality: Red Fascia Exposed: No Necrotic Amount: Small (1-33%) Fat Layer (Subcutaneous Tissue) Exposed: Yes Necrotic Quality: Adherent Slough Tendon Exposed: No Muscle Exposed: No Joint Exposed: No Bone Exposed: No Electronic Signature(s) Signed: 01/18/2021 12:00:41 PM By: Baruch Gouty RN, BSN Entered By: Baruch Gouty on 01/17/2021 11:34:43 -------------------------------------------------------------------------------- Wound Assessment Details Patient Name: Date of Service: [name] 01/17/2021 10:45 A M Medical Record Number: [id number] Patient Account Number: [id number] Date of Birth/Sex: Treating RN: October 29, 1951 (70 y.o. [name] [name], [name] Primary Care Phelan Schadt: [name] Other Clinician: Referring Alailah Safley: Treating Ruth Tully/Extender: [name] in Treatment: 460 Wound Status Wound Number: 32 Primary Auto-immune Etiology: Wound Location: Right T Great oe Wound Open Wounding Event: Gradually Appeared Status: Date Acquired: 01/05/2021 Comorbid Anemia, Hypertension, Peripheral  Arterial Disease, Peripheral Weeks Of Treatment: 1 History: Venous Disease, Raynauds, Scleroderma, Rheumatoid Arthritis, Clustered Wound: No Osteoarthritis Photos Photo Uploaded By: Mikeal Hawthorne on 01/18/2021 14:16:54 Wound Measurements Length: (cm) 1.5 Width: (cm) 1.5 Depth: (cm) 0.1 Area: (cm) 1.767 Volume: (cm) 0.177 % Reduction in Area: -127.1% % Reduction in Volume: -126.9% Epithelialization: Small (1-33%) Tunneling: No Undermining: No Wound Description Classification: Full Thickness Without Exposed Support Structures Wound Margin: Flat and Intact Exudate Amount: Medium Exudate Type: Purulent Exudate Color: yellow, brown, green Foul Odor After Cleansing: No Slough/Fibrino No Wound Bed Granulation Amount: None Present (0%) Exposed Structure Necrotic Amount: Large (67-100%) Fascia Exposed: No Necrotic Quality: Adherent Slough Fat Layer (Subcutaneous Tissue) Exposed: Yes Tendon Exposed: No Muscle Exposed: No Joint Exposed: No Bone Exposed: No Electronic Signature(s) Signed: 01/18/2021 12:00:41 PM By: Baruch Gouty RN, BSN Entered By: Baruch Gouty on 01/17/2021 11:35:11 -------------------------------------------------------------------------------- Wound Assessment Details Patient Name: Date of Service: [name] 01/17/2021 10:45 A M Medical Record Number: [id number] Patient Account Number: [id number] Date of Birth/Sex: Treating RN: 1951/08/09 (70 y.o. [name] [name] Primary Care Ahtziri Jeffries: [name] Other Clinician: Referring Jamell Opfer: Treating Ruslan Mccabe/Extender: [name] in Treatment: 460 Wound Status Wound Number: 33 Primary Pressure Ulcer Etiology: Wound Location: Right T Second oe Wound Open Wounding Event: Pressure Injury Status: Date Acquired: 01/17/2021 Comorbid Anemia, Hypertension, Peripheral Arterial Disease, Peripheral Weeks Of Treatment: 0 History: Venous Disease, Raynauds, Scleroderma,  Rheumatoid Arthritis, Clustered Wound: No Osteoarthritis Photos Photo Uploaded By: Mikeal Hawthorne on 01/18/2021 14:25:17 Wound Measurements Length: (cm) 0.4 Width: (cm) 0.4 Depth: (cm) 0.1 Area: (cm) 0.126 Volume: (cm) 0.013 % Reduction in Area: % Reduction in Volume: Epithelialization: None Tunneling: No Undermining: No Wound Description Classification: Category/Stage III Wound Margin: Flat and Intact Exudate Amount: Small Exudate Type: Serous Exudate Color: amber Foul Odor After Cleansing: No Slough/Fibrino No Wound Bed Granulation Amount: Small (1-33%) Exposed Structure Granulation Quality: Pink Fascia Exposed: No Necrotic Amount: Large (67-100%) Fat Layer (Subcutaneous Tissue) Exposed: Yes Necrotic Quality: Adherent Slough Tendon Exposed: No Muscle Exposed: No Joint Exposed: No Bone Exposed: No Electronic Signature(s) Signed: 01/18/2021 12:00:41 PM By: Baruch Gouty RN, BSN Entered By: Baruch Gouty on 01/17/2021 11:41:24 -------------------------------------------------------------------------------- Wound Assessment Details Patient Name: Date of Service: [name] 01/17/2021 10:45 A M Medical Record Number: [id number] Patient Account Number: [id number] Date of Birth/Sex: Treating RN: 10-06-1951 (70 y.o. [name] [name] Primary Care Cayton Cuevas: [name] Other Clinician: Referring Risa Auman: Treating Hartlee Amedee/Extender: [name] in Treatment: 460 Wound Status Wound Number: 5 Primary Venous Leg Ulcer Etiology: Wound Location:  Right, Medial Lower Leg Wound Open Wounding Event: Gradually Appeared Status: Date Acquired: 01/13/2013 Comorbid Anemia, Hypertension, Peripheral Arterial Disease, Peripheral Weeks Of Treatment: 416 History: Venous Disease, Raynauds, Scleroderma, Rheumatoid Arthritis, Clustered Wound: Yes Osteoarthritis Photos Photo Uploaded By: Mikeal Hawthorne on 01/18/2021 11:52:28 Wound  Measurements Length: (cm) 2.3 Width: (cm) 2.1 Depth: (cm) 0.1 Area: (cm) 3.793 Volume: (cm) 0.379 % Reduction in Area: -168.2% % Reduction in Volume: -168.8% Epithelialization: Small (1-33%) Tunneling: No Undermining: No Wound Description Classification: Full Thickness With Exposed Support Structures Wound Margin: Flat and Intact Exudate Amount: Medium Exudate Type: Purulent Exudate Color: yellow, brown, green Foul Odor After Cleansing: No Slough/Fibrino Yes Wound Bed Granulation Amount: Small (1-33%) Exposed Structure Granulation Quality: Pink Fascia Exposed: No Necrotic Amount: Large (67-100%) Fat Layer (Subcutaneous Tissue) Exposed: Yes Necrotic Quality: Adherent Slough Tendon Exposed: No Muscle Exposed: No Joint Exposed: No Bone Exposed: No Electronic Signature(s) Signed: 01/18/2021 12:00:41 PM By: Baruch Gouty RN, BSN Entered By: Baruch Gouty on 01/17/2021 11:35:55 -------------------------------------------------------------------------------- Vitals Details Patient Name: Date of Service: [name], [name] 01/17/2021 10:45 A M Medical Record Number: [id number] Patient Account Number: [id number] Date of Birth/Sex: Treating RN: [birth year] (70 y.o. [name] [name] Primary Care Finley Chevez: [name] Other Clinician: Referring Danyka Merlin: Treating Colby Reels/Extender: [name] in Treatment: 460 Vital Signs Time Taken: 11:05 Temperature (F): 98.3 Height (in): 68 Pulse (bpm): 90 Source: Stated Respiratory Rate (breaths/min): 18 Weight (lbs): 132 Blood Pressure (mmHg): 164/72 Source: Stated Reference Range: 80 - 120 mg / dl Body Mass Index (BMI): 20.1 Electronic Signature(s) Signed: 01/18/2021 12:00:41 PM By: Baruch Gouty RN, BSN Entered By: Baruch Gouty on 01/17/2021 11:05:47

## 2021-01-18 NOTE — Telephone Encounter (Signed)
Patient was denied assistance for [employer]. She is over the income. Apparently she had PAP in 2019 and was denied for this same assistance since.   Called and left the patient a message to follow up with me.  Charlann Boxer, CPhT

## 2021-01-18 NOTE — Telephone Encounter (Signed)
Spoke to patient's husband. The patient had a [place] last year and a TAF grant in 2020. All I can do at this time is place her on the [facility] wait list.  Advised them to call me with any other questions they may have.  Charlann Boxer, CPhT

## 2021-01-19 ENCOUNTER — Ambulatory Visit (INDEPENDENT_AMBULATORY_CARE_PROVIDER_SITE_OTHER): Payer: Medicare Other | Admitting: Vascular Surgery

## 2021-01-19 ENCOUNTER — Encounter (HOSPITAL_BASED_OUTPATIENT_CLINIC_OR_DEPARTMENT_OTHER): Payer: Medicare Other | Admitting: Internal Medicine

## 2021-01-19 ENCOUNTER — Telehealth: Payer: Self-pay | Admitting: Internal Medicine

## 2021-01-19 ENCOUNTER — Other Ambulatory Visit: Payer: Self-pay

## 2021-01-19 ENCOUNTER — Encounter: Payer: Self-pay | Admitting: Vascular Surgery

## 2021-01-19 VITALS — BP 156/75 | HR 79 | Temp 98.3°F | Resp 20 | Ht 67.0 in | Wt 118.0 lb

## 2021-01-19 DIAGNOSIS — L97528 Non-pressure chronic ulcer of other part of left foot with other specified severity: Secondary | ICD-10-CM | POA: Diagnosis not present

## 2021-01-19 DIAGNOSIS — I739 Peripheral vascular disease, unspecified: Secondary | ICD-10-CM

## 2021-01-19 DIAGNOSIS — I779 Disorder of arteries and arterioles, unspecified: Secondary | ICD-10-CM

## 2021-01-19 DIAGNOSIS — L97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: Secondary | ICD-10-CM | POA: Diagnosis not present

## 2021-01-19 DIAGNOSIS — I70248 Atherosclerosis of native arteries of left leg with ulceration of other part of lower left leg: Secondary | ICD-10-CM | POA: Diagnosis not present

## 2021-01-19 DIAGNOSIS — L97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: Secondary | ICD-10-CM | POA: Diagnosis not present

## 2021-01-19 DIAGNOSIS — I70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 lower right leg: Secondary | ICD-10-CM | POA: Diagnosis not present

## 2021-01-19 DIAGNOSIS — L97811 Non-pressure chronic ulcer of other part of right lower leg limited to breakdown of skin: Secondary | ICD-10-CM | POA: Diagnosis not present

## 2021-01-19 NOTE — Telephone Encounter (Signed)
[name] [name] [phone]  [name] called to say that Marshell has constant pain in her leg and would like to know how you feel about Hemp for pain, and he also said they were still interested in 5 day dose of a pain reliever. He was just wandering about the Hemp since it is not a narcotic.

## 2021-01-19 NOTE — Telephone Encounter (Signed)
Pend Norco  5/325 one tab with food every 8 hours as needed for leg pain #15

## 2021-01-19 NOTE — Progress Notes (Signed)
Patient is a 70 year old female who returns for follow-up today.  She was last seen December 29, 2020.  Sent for evaluation of worsening wounds of her right lower extremity.  She has previously had bilateral femoral endarterectomies by Dr. [name].  She has also had previous laser ablation of her right and left greater saphenous vein by Dr. [name].  She has also had angioplasty of her left tibioperoneal trunk and anterior tibial artery by Dr. Trula Slade in October 2021.  She has scleroderma.  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  She has also had prior amputation of multiple toes.  She is currently being followed by Dr. Dellia Nims at the wound clinic.  She is on Plavix and a statin.  Current Outpatient Medications on File Prior to Visit  Medication Sig Dispense Refill  . acetaminophen (TYLENOL) 650 MG CR tablet Take 1,300 mg by mouth every 8 (eight) hours as needed for pain.     . Biotin 5000 MCG TABS Take 5,000 mcg by mouth daily.    . Calcium Carbonate-Vit D-Min (CALCIUM 1200 PO) Take 1,200 mg by mouth at bedtime. Chewable    . cefdinir (OMNICEF) 300 MG capsule Take 300 mg by mouth 2 (two) times daily.    . Cholecalciferol (VITAMIN D) 2000 units CAPS Take 2,000 Units by mouth at bedtime.    . citalopram (CELEXA) 40 MG tablet TAKE 1 TABLET BY MOUTH EVERY DAY 30 tablet 3  . clopidogrel (PLAVIX) 75 MG tablet TAKE 1 TABLET BY MOUTH EVERY DAY 90 tablet 3  . FERREX 150 150 MG capsule TAKE 1 CAPSULE BY MOUTH TWICE A DAY 180 capsule 1  . fish oil-omega-3 fatty acids 1000 MG capsule Take 1,000 mg by mouth 2 (two) times daily.    . folic acid (FOLVITE) 1 MG tablet TAKE 1 TABLET BY MOUTH EVERY DAY 90 tablet 1  . levothyroxine (SYNTHROID) 50 MCG tablet TAKE 1 TABLET BY MOUTH DAILY BEFORE BREAKFAST (Patient taking differently: Take 50 mcg by mouth daily before breakfast.) 90 tablet 1  . loperamide (IMODIUM) 2 MG capsule Take 2 mg by mouth daily as needed for  diarrhea or loose stools.     Marland Kitchen MAGNESIUM ASPARTATE PO Take 150 mg by mouth in the morning and at bedtime.    . Multiple Vitamin (MULTIVITAMIN WITH MINERALS) TABS tablet Take 1 tablet by mouth daily.    . OPSUMIT 10 MG tablet Take 1 tablet (22m) by mouth daily. (Patient taking differently: Take 10 mg by mouth daily.) 30 tablet 11  . OXYGEN Inhale 2-3 L into the lungs continuous.     . pantoprazole (PROTONIX) 40 MG tablet Take 1 tablet (40 mg total) by mouth 2 (two) times daily. 180 tablet 3  . potassium chloride 20 MEQ/15ML (10%) SOLN TAKE 2 TEASPOONFUL (DILUTED IN WATER) ONCE DAILY (Patient taking differently: Take 20 mEq by mouth daily.) 473 mL 11  . predniSONE (DELTASONE) 5 MG tablet Take 5 mg by mouth daily.    . RESTASIS 0.05 % ophthalmic emulsion Place 2 drops into both eyes 2 (two) times daily.     . rosuvastatin (CRESTOR) 5 MG tablet Take 1 tablet every Monday, Wednesday, and Friday. (Patient taking differently: Take 5 mg by mouth every Monday, Wednesday, and Friday.) 45 tablet 3  . SANTYL ointment Apply 1 application topically every other day.     . sildenafil (REVATIO) 20 MG tablet TAKE 3 TABLETS (60 MG TOTAL) BY MOUTH 3 (  THREE) TIMES DAILY. PLEASE CALL FOR OFFICE VISIT [phone] (Patient taking differently: Take 60 mg by mouth 3 (three) times daily.) 810 tablet 1  . spironolactone (ALDACTONE) 25 MG tablet TAKE 1 TABLET BY MOUTH EVERY DAY 90 tablet 0  . torsemide (DEMADEX) 20 MG tablet Take 20-40 mg by mouth See admin instructions. Takes 40 mg in the morning and 20 mg at night    . traMADol (ULTRAM) 50 MG tablet Take 50 mg by mouth every 6 (six) hours as needed for moderate pain or severe pain.    . valACYclovir (VALTREX) 500 MG tablet Take 1 tablet (500 mg total) by mouth 2 (two) times daily. (Patient taking differently: Take 500 mg by mouth daily as needed (lip sore).) 10 tablet 0  . vitamin B-12 (CYANOCOBALAMIN) 1000 MCG tablet Take 1,000 mcg by mouth daily.     No current  facility-administered medications on file prior to visit.     Review of systems: She has no fever or chills.  She is on home oxygen.  She has no chest pain.  Physical exam:     Data: Patient had a lower extremity arteriogram performed by Dr. Donzetta Matters on January 02, 2021.  I reviewed these images today.  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  She had two-vessel runoff bilaterally.  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  I reviewed and interpreted all these images at her office visit today.  Assessment: I believe from an arterial and venous standpoint the patient is currently as good as she is going to get as far as perfusion is concerned.  I did review her arteriogram which she should have adequate flow for wound healing in the right leg.  I also her venous duplex scan which although it does show DVT in the right superficial femoral vein she should have adequate collateral flow around this for wound healing.  I do not believe any venous intervention would improve her current symptoms.  Plan: I will discuss the patient's case with plastic surgery later today I believe she needs most likely excisional debridement to get back to healthy tissue in the right leg and potentially skin grafting if she can get healthy appearing tissue.  She apparently has an appointment scheduled with infectious disease in the near future regarding bacterial colonization of the wound.  I will call her later today after I have spoken with plastic surgery I got a voicemail from Dr. Mingo Amber earlier today.  Ruta Hinds, MD [facility] Office: [phone]

## 2021-01-19 NOTE — Telephone Encounter (Signed)
RX pended

## 2021-01-20 ENCOUNTER — Encounter: Payer: Self-pay | Admitting: Plastic Surgery

## 2021-01-20 ENCOUNTER — Other Ambulatory Visit: Payer: Self-pay

## 2021-01-20 ENCOUNTER — Ambulatory Visit (INDEPENDENT_AMBULATORY_CARE_PROVIDER_SITE_OTHER): Payer: Medicare Other | Admitting: Plastic Surgery

## 2021-01-20 ENCOUNTER — Telehealth: Payer: Self-pay | Admitting: Internal Medicine

## 2021-01-20 VITALS — BP 142/67 | HR 83 | Temp 97.7°F | Ht 67.0 in | Wt 118.0 lb

## 2021-01-20 DIAGNOSIS — S81801A Unspecified open wound, right lower leg, initial encounter: Secondary | ICD-10-CM | POA: Diagnosis not present

## 2021-01-20 DIAGNOSIS — I739 Peripheral vascular disease, unspecified: Secondary | ICD-10-CM

## 2021-01-20 MED ORDER — HYDROCODONE-ACETAMINOPHEN 5-325 MG PO TABS: ORAL_TABLET | ORAL | 0 refills | Status: DC

## 2021-01-20 NOTE — Telephone Encounter (Addendum)
Have sent in Rx for Norco 5/325 #15 for leg pain. [employer]. May take one tab with food sparingly up to 3 times daily but can only call in 5 day supply of this med.

## 2021-01-20 NOTE — Telephone Encounter (Signed)
LVM to CB, she is going to call in 5 day pain medication and hold off on [facility] referral since Vascular Surgeon is sending Kalifa to Psychiatric nurse. Dr Renold Genta thinks that is a good idea.

## 2021-01-20 NOTE — Progress Notes (Signed)
Referring Provider Baxley, Cresenciano Lick, MD [address] [place],  [place] [id number]   CC:  Chief Complaint  Patient presents with  . Advice Only      [name] [name] is an 70 y.o. female.  HPI: Patient presents with a chronic right lower extremity wound.  She has scleroderma and has had multiple vascular issues treated in the past.  She has had both arterial and venous insufficiency treated endovascularly by her vascular surgeons.  She is not on any to be disease modifying medications.  She has had a number of lower extremity wounds that have oscillated over the past number of years.  The current worrisome wound on the posterior aspect of her right lower extremity has been there for about 6 months.  It grew rapidly initially and occurred spontaneously.  Despite wound care performed through the wound center the wound has not progressed.  It is very painful and the patient wants something that might give her to help with healing.  Allergies  Allergen Reactions  . Levofloxacin Other (See Comments)    FLU LIKE SYMPTOM   . Sulfa Antibiotics Rash and Other (See Comments)    Face peeled Severe rash with significant peeling of face. No airway involvement per patient   . Doxycycline Hyclate Swelling    SWELLING REACTION UNSPECIFIED  "TABLETS ONLY - CAPSULES ARE TOLERATED FINE"  . Penicillins Rash    Has patient had a PCN reaction causing immediate rash, facial/tongue/throat swelling, SOB or lightheadedness with hypotension: no Has patient had a PCN reaction causing severe rash involving mucus membranes or skin necrosis: no Has patient had a PCN reaction that required hospitalization: no Has patient had a PCN reaction occurring within the last 10 years: {no If all of the above answers are "NO", then may proceed with Cephalosporin use.    Outpatient Encounter Medications as of 01/20/2021  Medication Sig  . Biotin 5000 MCG TABS Take 5,000 mcg by mouth daily.  . Calcium Carbonate-Vit  D-Min (CALCIUM 1200 PO) Take 1,200 mg by mouth at bedtime. Chewable  . cefdinir (OMNICEF) 300 MG capsule Take 300 mg by mouth 2 (two) times daily. (Patient not taking: Reported on 01/20/2021)  . Cholecalciferol (VITAMIN D) 2000 units CAPS Take 2,000 Units by mouth at bedtime.  . citalopram (CELEXA) 40 MG tablet TAKE 1 TABLET BY MOUTH EVERY DAY  . clopidogrel (PLAVIX) 75 MG tablet TAKE 1 TABLET BY MOUTH EVERY DAY  . FERREX 150 150 MG capsule TAKE 1 CAPSULE BY MOUTH TWICE A DAY  . fish oil-omega-3 fatty acids 1000 MG capsule Take 1,000 mg by mouth 2 (two) times daily.  . folic acid (FOLVITE) 1 MG tablet TAKE 1 TABLET BY MOUTH EVERY DAY  . levothyroxine (SYNTHROID) 50 MCG tablet TAKE 1 TABLET BY MOUTH DAILY BEFORE BREAKFAST (Patient taking differently: Take 50 mcg by mouth daily before breakfast.)  . loperamide (IMODIUM) 2 MG capsule Take 2 mg by mouth daily as needed for diarrhea or loose stools.   Marland Kitchen MAGNESIUM ASPARTATE PO Take 150 mg by mouth in the morning and at bedtime.  . Multiple Vitamin (MULTIVITAMIN WITH MINERALS) TABS tablet Take 1 tablet by mouth daily.  . OPSUMIT 10 MG tablet Take 1 tablet (68m) by mouth daily. (Patient taking differently: Take 10 mg by mouth daily.)  . OXYGEN Inhale 2-3 L into the lungs continuous.   . pantoprazole (PROTONIX) 40 MG tablet Take 1 tablet (40 mg total) by mouth 2 (two) times daily.  . potassium  chloride 20 MEQ/15ML (10%) SOLN TAKE 2 TEASPOONFUL (DILUTED IN WATER) ONCE DAILY (Patient taking differently: Take 20 mEq by mouth daily.)  . predniSONE (DELTASONE) 5 MG tablet Take 5 mg by mouth daily.  . RESTASIS 0.05 % ophthalmic emulsion Place 2 drops into both eyes 2 (two) times daily.   . rosuvastatin (CRESTOR) 5 MG tablet Take 1 tablet every Monday, Wednesday, and Friday. (Patient taking differently: Take 5 mg by mouth every Monday, Wednesday, and Friday.)  . SANTYL ointment Apply 1 application topically every other day.  (Patient not taking: Reported on  01/20/2021)  . sildenafil (REVATIO) 20 MG tablet TAKE 3 TABLETS (60 MG TOTAL) BY MOUTH 3 (THREE) TIMES DAILY. PLEASE CALL FOR OFFICE VISIT [phone] (Patient taking differently: Take 60 mg by mouth 3 (three) times daily.)  . spironolactone (ALDACTONE) 25 MG tablet TAKE 1 TABLET BY MOUTH EVERY DAY  . torsemide (DEMADEX) 20 MG tablet Take 20-40 mg by mouth See admin instructions. Takes 40 mg in the morning and 20 mg at night  . traMADol (ULTRAM) 50 MG tablet Take 50 mg by mouth every 6 (six) hours as needed for moderate pain or severe pain. (Patient not taking: Reported on 01/20/2021)  . valACYclovir (VALTREX) 500 MG tablet Take 1 tablet (500 mg total) by mouth 2 (two) times daily. (Patient taking differently: Take 500 mg by mouth daily as needed (lip sore).)  . vitamin B-12 (CYANOCOBALAMIN) 1000 MCG tablet Take 1,000 mcg by mouth daily.  . [DISCONTINUED] acetaminophen (TYLENOL) 650 MG CR tablet Take 1,300 mg by mouth every 8 (eight) hours as needed for pain.    No facility-administered encounter medications on file as of 01/20/2021.     Past Medical History:  Diagnosis Date  . Anemia   . Arthritis   . DOE (dyspnea on exertion)   . Epistaxis   . History of chicken pox   . Hypertension   . Hypothyroidism   . Leg ulcer ([place])   . Melanoma ([place]) 1999  . 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[place])   . Non Hodgkin's lymphoma ([place])    Treated with chemo 2010, felt to be cured.  Marland Kitchen PAH (pulmonary artery hypertension) ([place])   . Peripheral arterial occlusive disease ([place])   . Pulmonary hypertension ([place])   . Requires supplemental oxygen    2 liters-3liters when moving  . 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 ABDOMINAL AORTOGRAM W/LOWER EXTREMITY Bilateral 10/04/2020   Procedure: ABDOMINAL AORTOGRAM W/LOWER EXTREMITY;  Surgeon: Serafina Mitchell, MD;  Location: [place] [facility];  Service: Cardiovascular;  Laterality: Bilateral;  . ABDOMINAL AORTOGRAM W/LOWER  EXTREMITY Bilateral 01/02/2021   Procedure: ABDOMINAL AORTOGRAM W/LOWER EXTREMITY;  Surgeon: Waynetta Sandy, MD;  Location: [place] [facility];  Service: Cardiovascular;  Laterality: Bilateral;  . AMPUTATION Right 10/24/2017   Procedure: RAY resection right index;  Surgeon: Daryll Brod, MD;  Location: [place];  Service: Orthopedics;  Laterality: Right;  Axillary block  . AMPUTATION Right 11/21/2018   Procedure: AMPUTATION RAY RIGHT MIDDLE FINGER;  Surgeon: Daryll Brod, MD;  Location: [place];  Service: Orthopedics;  Laterality: Right;  . AMPUTATION Right 10/22/2019   Procedure: AMPUTATION DIGIT RIGHT RING AND SMALL FINGER, DIGITAL SYMPATHECTOMY RIGHT RADIAL ULNAR ARCH;  Surgeon: Daryll Brod, MD;  Location: [place];  Service: Orthopedics;  Laterality: Right;  AXILARY  . apligraph  12-04-2102,   12-18-2012   bilateral calves   [name]  .  biopsy on cerebellum    . COLONOSCOPY WITH PROPOFOL Left 10/07/2015   Procedure: COLONOSCOPY WITH PROPOFOL;  Surgeon: [name], MD;  Location: [place];  Service: Endoscopy;  Laterality: Left;  . ENDARTERECTOMY FEMORAL Right 09/14/2016   Procedure: ENDARTERECTOMY FEMORAL RIGHT;  Surgeon: [name], MD;  Location: [facility];  Service: Vascular;  Laterality: Right;  . ENDOVENOUS ABLATION SAPHENOUS VEIN W/ LASER  07-24-2012   right greater saphenous vein by [name], M.D.   . ENDOVENOUS ABLATION SAPHENOUS VEIN W/ LASER  10-16-2012   left greater saphenous vein  by Dr. [name]  . ENDOVENOUS ABLATION SAPHENOUS VEIN W/ LASER Right 09-01-2015   endovenous laser ablation right greater saphenous vein by [name] MD  . ESOPHAGOGASTRODUODENOSCOPY N/A 05/07/2016   Procedure: ESOPHAGOGASTRODUODENOSCOPY (EGD);  Surgeon: [name], MD;  Location: [facility] ENDOSCOPY;  Service: Endoscopy;  Laterality: N/A;  . ESOPHAGOGASTRODUODENOSCOPY N/A 11/23/2020   Procedure: ESOPHAGOGASTRODUODENOSCOPY (EGD);  Surgeon: [name], MD;   Location: [facility];  Service: Endoscopy;  Laterality: N/A;  . ESOPHAGOGASTRODUODENOSCOPY (EGD) WITH PROPOFOL Left 10/07/2015   Procedure: ESOPHAGOGASTRODUODENOSCOPY (EGD) WITH PROPOFOL;  Surgeon: [name], MD;  Location: [place];  Service: Endoscopy;  Laterality: Left;  . GIVENS CAPSULE STUDY N/A 07/31/2016   Procedure: GIVENS CAPSULE STUDY;  Surgeon: [name], MD;  Location: [facility] ENDOSCOPY;  Service: Endoscopy;  Laterality: N/A;  . GIVENS CAPSULE STUDY N/A 11/23/2020   Procedure: GIVENS CAPSULE STUDY;  Surgeon: [name], MD;  Location: [facility] ENDOSCOPY;  Service: Endoscopy;  Laterality: N/A;  . IR IVC FILTER PLMT / S&I Burke Keels GUID/MOD SED  11/22/2020  . LOWER EXTREMITY ANGIOGRAPHY N/A 10/25/2020   Procedure: LOWER EXTREMITY ANGIOGRAPHY - Left;  Surgeon: Serafina Mitchell, MD;  Location: [place] [facility];  Service: Cardiovascular;  Laterality: N/A;  . melonoma removes Right    behind right knee  . OPEN REDUCTION INTERNAL FIXATION (ORIF) DISTAL RADIAL FRACTURE Left 11/24/2019   Procedure: OPEN REDUCTION INTERNAL FIXATION (ORIF) DISTAL RADIAL FRACTURE;  Surgeon: Altamese Felton, MD;  Location: [place];  Service: Orthopedics;  Laterality: Left;  . PATCH ANGIOPLASTY Right 09/14/2016   Procedure: PATCH ANGIOPLASTY RIGHT FEMORAL ARTERY USING HEMASHIELD PLATINUM FINESSE PATCH;  Surgeon: [name], MD;  Location: [place];  Service: Vascular;  Laterality: Right;  . PERIPHERAL VASCULAR BALLOON ANGIOPLASTY Right 10/04/2020   Procedure: PERIPHERAL VASCULAR BALLOON ANGIOPLASTY;  Surgeon: Serafina Mitchell, MD;  Location: [place] [facility];  Service: Cardiovascular;  Laterality: Right;  SFA/PT/AT/PTtrunk  . PERIPHERAL VASCULAR BALLOON ANGIOPLASTY Left 10/25/2020   Procedure: PERIPHERAL VASCULAR BALLOON ANGIOPLASTY;  Surgeon: Serafina Mitchell, MD;  Location: [place] [facility];  Service: Cardiovascular;  Laterality: Left;  anterior tibial and tibioperoneal trunk  . PERIPHERAL VASCULAR CATHETERIZATION N/A  09/07/2016   Procedure: Abdominal Aortogram w/Lower Extremity;  Surgeon: [name], MD;  Location: [place] [facility];  Service: Cardiovascular;  Laterality: N/A;  . RIGHT HEART CATH N/A 04/19/2017   Procedure: Right Heart Cath;  Surgeon: [name], MD;  Location: [place] [facility];  Service: Cardiovascular;  Laterality: N/A;  . RIGHT HEART CATHETERIZATION N/A 11/14/2012   Procedure: RIGHT HEART CATH;  Surgeon: [name], MD;  Location: [facility] CATH LAB;  Service: Cardiovascular;  Laterality: N/A;  . rt little toe removal  10/1998  . stent left thigh  3/11  . TENDON TRANSFER Right 10/24/2017   Procedure: transfer extensor indecus propius/extensor digitorum commomus index to middle ring and small;  Surgeon: Daryll Brod,  MD;  Location: [place];  Service: Orthopedics;  Laterality: Right;  . TONSILLECTOMY    . TONSILLECTOMY AND ADENOIDECTOMY    . TUNNELED VENOUS CATHETER PLACEMENT  01/2013  . ULNAR HEAD EXCISION Right 10/24/2017   Procedure: darrach right ulna;  Surgeon: Daryll Brod, MD;  Location: [place];  Service: Orthopedics;  Laterality: Right;  . VEIN LIGATION AND STRIPPING  05/16/2012   Procedure: VEIN LIGATION AND STRIPPING;  Surgeon: [name], MD;  Location: [facility] OR;  Service: Vascular;  Laterality: Right;  Irrigation and Debridement right lower leg, ligation of saphenous vein.    Family History  Problem Relation Age of Onset  . Lupus Father   . Lymphoma Father   . Hyperlipidemia Mother   . Cancer Daughter        sarcoma  . Colon cancer Other     Social History   Social History Narrative  . Not on file     Review of Systems General: Denies fevers, chills, weight loss CV: Denies chest pain, shortness of breath, palpitations  Physical Exam Vitals with BMI 01/20/2021 01/19/2021 01/12/2021  Height _0  _1  _2   Weight 118 lbs 118 lbs 118 lbs  BMI 18.48 37.44 51.46  Systolic 047 998 721  Diastolic 67 75 70  Pulse 83 79 100    General:  No acute  distress,  Alert and oriented, Non-Toxic, Normal speech and affect On examination of the right lower extremity there is a large wound on the posterior surface.  Is difficult to say the depth for sure due to necrotic tissue at the base but it is at least down to fascia and probably Achilles tendon as well.  There is no obvious purulence but there is devitalized tissue.  She has additional scattered wounds throughout the other areas below the knee.  It looks like she has had a previous toe amputation on that side.  There is pitting edema.  Assessment/Plan Patient presents with a chronic right lower extremity wound.  This is been refractory to conservative measures.  There is a fairly significant burden of devitalized tissue and I think he would benefit from a debridement.  I had a long discussion with the patient and her husband about the challenges in her current situation.  I explained she has an underlying physiologic problem that is causing these wounds to occur and that would unlikely be altered by my intervention.  I do feel that removing the devitalized tissue and applying a wound matrix would offer some benefit and the potential for healing would increase.  We went through the risks of the procedure including the risk of anesthesia and the are interested in moving forward.  We will plan to get this scheduled for week after next.  Cindra Presume 01/20/2021, 12:36 PM

## 2021-01-20 NOTE — Addendum Note (Signed)
Addended by: [name] on: 01/20/2021 11:56 AM   Modules accepted: Orders

## 2021-01-20 NOTE — Telephone Encounter (Signed)
Al called back and I gave him message, he verbalized understanding and I also let him know he did not need to send a mychart message about plastic surgeons plans that you would get office notes on that.

## 2021-01-23 ENCOUNTER — Other Ambulatory Visit: Payer: Self-pay

## 2021-01-23 ENCOUNTER — Encounter (HOSPITAL_BASED_OUTPATIENT_CLINIC_OR_DEPARTMENT_OTHER): Payer: Medicare Other | Admitting: Internal Medicine

## 2021-01-23 DIAGNOSIS — I87311 Chronic venous hypertension (idiopathic) with ulcer of right lower extremity: Secondary | ICD-10-CM | POA: Diagnosis not present

## 2021-01-23 DIAGNOSIS — L97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: Secondary | ICD-10-CM | POA: Diagnosis not present

## 2021-01-23 DIAGNOSIS — D509 Iron deficiency anemia, unspecified: Secondary | ICD-10-CM | POA: Diagnosis not present

## 2021-01-23 DIAGNOSIS — L97212 Non-pressure chronic ulcer of right calf with fat layer exposed: Secondary | ICD-10-CM | POA: Diagnosis not present

## 2021-01-23 DIAGNOSIS — L97512 Non-pressure chronic ulcer of other part of right foot with fat layer exposed: Secondary | ICD-10-CM | POA: Diagnosis not present

## 2021-01-23 DIAGNOSIS — L97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: Secondary | ICD-10-CM | POA: Diagnosis not present

## 2021-01-23 DIAGNOSIS — L97528 Non-pressure chronic ulcer of other part of left foot with other specified severity: Secondary | ICD-10-CM | POA: Diagnosis not present

## 2021-01-23 DIAGNOSIS — I70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 lower right leg: Secondary | ICD-10-CM | POA: Diagnosis not present

## 2021-01-23 DIAGNOSIS — L97322 Non-pressure chronic ulcer of left ankle with fat layer exposed: Secondary | ICD-10-CM | POA: Diagnosis not present

## 2021-01-23 DIAGNOSIS — I70248 Atherosclerosis of native arteries of left leg with ulceration of other part of lower left leg: Secondary | ICD-10-CM | POA: Diagnosis not present

## 2021-01-23 DIAGNOSIS — L97811 Non-pressure chronic ulcer of other part of right lower leg limited to breakdown of skin: Secondary | ICD-10-CM | POA: Diagnosis not present

## 2021-01-23 NOTE — Progress Notes (Addendum)
SETAREH, ROM ([id number]) Visit Report for 01/23/2021 Debridement Details Patient Name: Date of Service: [name] 01/23/2021 10:30 A M Medical Record Number: [id number] Patient Account Number: [id number] Date of Birth/Sex: Treating RN: 10-23-1951 (70 y.o. [name] Primary Care Provider: [name] Other Clinician: Referring Provider: Treating Provider/Extender: [name] in Treatment: 461 Debridement Performed for Assessment: Wound #29 Right,Proximal,Lateral Lower Leg Performed By: Physician Ricard Dillon., MD Debridement Type: Chemical/Enzymatic/Mechanical Agent Used: anasept and gauze Severity of Tissue Pre Debridement: Fat layer exposed Level of Consciousness (Pre-procedure): Awake and Alert Pre-procedure Verification/Time Out No Taken: Bleeding: None Hemostasis Achieved: Pressure Response to Treatment: Procedure was tolerated well Level of Consciousness (Post- Awake and Alert procedure): Post Debridement Measurements of Total Wound Length: (cm) 3 Width: (cm) 1 Depth: (cm) 0.1 Volume: (cm) 0.236 Character of Wound/Ulcer Post Debridement: Requires Further Debridement Severity of Tissue Post Debridement: Fat layer exposed Post Procedure Diagnosis Same as Pre-procedure Electronic Signature(s) Signed: 01/24/2021 5:14:25 PM By: Linton Ham MD Signed: 01/24/2021 5:42:27 PM By: Baruch Gouty RN, BSN Entered By: Baruch Gouty on 01/24/2021 16:25:03 -------------------------------------------------------------------------------- Debridement Details Patient Name: Date of Service: [name], [name] 01/23/2021 10:30 A M Medical Record Number: [id number] Patient Account Number: [id number] Date of Birth/Sex: Treating RN: August 04, 1951 (70 y.o. [name] Primary Care Provider: [name] Other Clinician: Referring Provider: Treating Provider/Extender: [name] in Treatment:  461 Debridement Performed for Assessment: Wound #31 Left,Medial Ankle Performed By: Physician Ricard Dillon., MD Debridement Type: Chemical/Enzymatic/Mechanical Agent Used: anasept and gauze Severity of Tissue Pre Debridement: Fat layer exposed Level of Consciousness (Pre-procedure): Awake and Alert Pre-procedure Verification/Time Out No Taken: Bleeding: None Hemostasis Achieved: Pressure Response to Treatment: Procedure was tolerated well Level of Consciousness (Post- Awake and Alert procedure): Post Debridement Measurements of Total Wound Length: (cm) 0.3 Width: (cm) 1.1 Depth: (cm) 0.1 Volume: (cm) 0.026 Character of Wound/Ulcer Post Debridement: Requires Further Debridement Severity of Tissue Post Debridement: Fat layer exposed Post Procedure Diagnosis Same as Pre-procedure Electronic Signature(s) Signed: 01/24/2021 5:14:25 PM By: Linton Ham MD Signed: 01/24/2021 5:42:27 PM By: Baruch Gouty RN, BSN Entered By: Baruch Gouty on 01/24/2021 16:25:43 -------------------------------------------------------------------------------- Debridement Details Patient Name: Date of Service: [name], [name] 01/23/2021 10:30 A M Medical Record Number: [id number] Patient Account Number: [id number] Date of Birth/Sex: Treating RN: 10-Aug-1951 (70 y.o. [name] Primary Care Provider: [name] Other Clinician: Referring Provider: Treating Provider/Extender: [name] in Treatment: 461 Debridement Performed for Assessment: Wound #32 Right T Great oe Performed By: Physician Ricard Dillon., MD Debridement Type: Chemical/Enzymatic/Mechanical Agent Used: anasept and gauze Level of Consciousness (Pre-procedure): Awake and Alert Pre-procedure Verification/Time Out No Taken: Bleeding: None Hemostasis Achieved: Pressure Response to Treatment: Procedure was tolerated well Level of Consciousness (Post- Awake and Alert procedure): Post  Debridement Measurements of Total Wound Length: (cm) 1.7 Width: (cm) 1.8 Depth: (cm) 0.1 Volume: (cm) 0.24 Character of Wound/Ulcer Post Debridement: Requires Further Debridement Post Procedure Diagnosis Same as Pre-procedure Electronic Signature(s) Signed: 01/24/2021 5:14:25 PM By: Linton Ham MD Signed: 01/24/2021 5:42:27 PM By: Baruch Gouty RN, BSN Entered By: Baruch Gouty on 01/24/2021 16:26:15 -------------------------------------------------------------------------------- HPI Details Patient Name: Date of Service: [name], [name] 01/23/2021 10:30 A M Medical Record Number: [id number] Patient Account Number: [id number] Date of Birth/Sex: Treating RN: 1951-08-07 (70 y.o. [name] Primary Care Provider: [name] Other Clinician: Referring  Provider: Treating Provider/Extender: [name] in Treatment: 36 History of Present Illness HPI Description: this is a patient who initially came to [place] for wounds on the medial malleoli bilaterally as well as her upper medial lower extremities bilaterally. These wounds eventually healed with assistance of Apligraf's bilaterally. While this was occurring she developed the current wound which opened into a fairly substantial wound on the right lower extremity. These are mostly secondary to venous stasis physiology however the patient also has underlying scleroderma, pulmonary hypertension. The wound has been making good progress lately with the Hydrofera Blue-based dressing. 03/17/2015; patient had a arterial evaluation a year ago. Her right ABI was 0.86 left was 1.0. T brachial index was 0.41 on the right 0.45 on the left. Her oe bilateral common femoral artery waveforms were triphasic. Her white popliteal posterior tibial artery and anterior tibial artery waveforms were monophasic with good amplitude. Luteal artery waveforms were biphasic it was felt that her bilateral great toe pressures are of  normal although adequate for tissue healing. 03/24/2015. The condition of this wound is not really improved that. He was covered with as fibrinous surface slough and eschar. This underwent an aggressive debridement with both a curette and scalpel. I still cannot really get down to what I can would consider to be a viable surface. There is no evidence of infection. Previous workup for ischemia roughly a year ago was negative nevertheless I think that continues to be a concern 04/07/15. The patient arrived for application of second Apligraf. Once again the surface of this wound is certainly less viable than I would like for an advanced treatment option. An aggressive debridement was done. She developed some arteriolar bleeding which required that along pressure and silver alginate. 04/21/15: change in the condition of this wound. Once again it is covered in a gelatinous surface slough. After debridement today surface of the wound looks somewhat better but now a heeling surface 05/05/15 Apligraf #4 applied.Still a lot of slough on this wound. 05/19/15 Apligraf #5 applied. Still a lot of slough on this wound I did not aggressively remove this 06/02/15; continued copious amounts of surface slough. This debridement fairly easily. 06/08/2015 -- the last time she had a venous duplex study done was over 3 years ago and the surgery was prior to that. I have recommended that she sees Dr. early for a another opinion regarding a repeat venous duplex and possibly more endovenous ablation of vein stripping of micro-phlebotomies. 06/16/15; wound has a gelatinous surface eschar that the debridement fairly easily to a point. I don't disagree with the venous workup and perhaps even arterial re-evaluation. She is on prednisone 5 mg and continue his medications for her pulmonary artery hypertension I am not sure if the latter have any wound care healing issues I would need to investigate this. 06/23/15 continues with a gelatinous  surface eschar with of fibrinous underlying. What I can see of this wound does not look unhealthy however I just can't get this material which I think is 2 different layers off. Empiric culture done 06/30/15; unfortunately not a lot of change in this wound. A gelatinous surface eschar is easily removed however it has a tight fibrinous surface underneath the. 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 The wound comes back and basically and unchanged state the. She has a gelatinous surface that is more easily removed however there is a tightly fibrinous surface underneath the. There is no evidence of infection. She  has a vascular follow-up next month. I would have to inject her in order to do a more aggressive debridement of this area 07/21/15 the wound is roughly in the same state albeit the debridement is done with greater ease. There is less of the fibrinous eschar underneath the. There is no evidence of infection. She has follow-up with vascular surgery next week. No evidence of surrounding infection. Her original distal wounds healed while this one formed. 07/28/15; wound is easier to debride. No wound erythema. She is seeing Dr. [name] next week. 08/18/15 Has been to [facility] for repeat ablation. Have been using medihoney pad with some improvement 08/25/15; absolutely no change in the condition of this wound in either its overall size or surface condition in many months now. At one point I had this down to [place] healthier surface I think with [facility] however this did not actually progress towards closure. Do not believe that the wound has actually deteriorated in terms of volume at all. We have been using a medihoney pad which allows easier debridement of the gelatinous eschar but again no overall actual improvement. the patient is going towards an ablation with pain and vascular which I think is scheduled for next week. The only other investigation that I could first see  would be a biopsy. She does have underlying scleroderma 09/02/15 eschar is much easier to debridement however the base of this does not look particularly vibrant. We changed to Iodoflex. The patient had her ablation earlier this week 09/09/15 again the debridement over the base of this wound is easier and the base of the wound looks considerably better. We will continue the Iodoflex. Dr. Tawni Millers has expressed his satisfaction with the result of her ablation 09/15/15 once again the wound is relatively free of surface eschar. No debridement was done today. It has a pale-looking base to it. although this is not as deep as it once was it seems to be expanding especially inferiorly. She has had recent venous ablation but this is no closer to healing.I've gone ahead and done a punch biopsy this from the inferior part of the wound close to normal skin 09/22/15: the wound is relatively free of surface eschar. There is some surrounding eschar. I'm not exactly sure at what level the surface is that I am seeing. 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 No evidence of atypical infection, malignancy. 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 No debridement was done 09/29/15; the wound surface is relatively free of surface eschar. There is eschar surrounding the walls of the wound. Aside from the improvement in the amount of surface slough. This wound has not progressed any towards closure. There is not even a surface that looks like there at this is ready. There is no evidence of any infection nor maligancy based on biopsy I did on 9/15. I continued with the Iodoflex however I am looking towards some alternative to try to promote some closure or filling in of this surface. Consider triple layer Oasis. Collagen did not result in adequate control of the surface slough 10/13/15; the patient was in hospital last week with severe anemia. The wound looks somewhat better  after debridement although there is widening medially. There is no evidence of infection. 10/20/15; patient's wound on the right lateral lower leg is essentially unchanged. This underwent a light surface debridement and in general the debridement is easier and the surface looks improved. I noted in doing this on the side  of the wound what appears to be a piece of calcium deposition. The patient noted that she had previous things on the tips of her fingers. 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 She follows with rheumatology and I have asked them to talk to her about this. In view of that S the nonhealing ulcer may have something to do with calcinosis and also unrelieved Raynaud's disease in this area. I should note that her original wounds on the right and left medial malleolus and the inner aspects of both legs just below the knees did however heal over 10/30/15; the patient's wound on the right lateral lower leg is essentially unchanged. I was able to remove some calcified material from the medial wound edge. 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 Otherwise the wound appears essentially unchanged there is less adherent surface eschar. Some of the calcified material was sent to pathology for analysis 11/17/15. The patient's wound on the right lateral leg is essentially unchanged. Wider Medially. 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 There is some surface slough but I did not debridement this. No evidence of surrounding infection. 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 This will  make healing difficult 12/15/15; the wound has less depth surface has a fibrinous slough and calcifications in the wound edge no evidence of infection 12/22/15; the wound definitely has less Fibrinous slough on the base. Calcifications around the wound edges are still evident. Although the wound bed looks healthier it is still pale in appearance. Previous biopsy did not show malignancy 01/04/15; surgical debridement of nonviable slough and subcutaneous tissue the wound cleans up quite nicely but appears to be expanding outward calcifications around the wound edges are still there. 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 She is to see her rheumatologist I'll see if he has any opinions on this. My punch biopsy done in September did not show calcifications although these are clearly evident. 01/19/16 light selective debridement of nonviable surface slough. There is epithelialization medially. This gives me reason for cautious optimism. She has been to see her rheumatologist, there is nothing that can be done for this type of soft tissue calcification associated with scleroderma 02/02/16 no debridement although there is a light surface slough. She has 2 peninsulas of skin 1 inferiorly and one medially. We continue to make a slight and slow but definite progressive here 02/16/16; light surface debridement with more attention to the circumference of the wound bed where the fibrinous eschar is more prevalent. No calcifications detected. She seems to have done nicely with the [facility] with some epithelialization and some improvement in the overall wound volume. 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 Continued attention to the circumference of the wound where the fibrinous eschar in calcinosis or prevalent. No calcifications were  detected. She has continued improvement with Hydrofera Blue. The wound is no longer as deep 03/15/16 surgical debridement done to remove surface escha Especially around the circumference of the wound where there is nonviable subcutaneous tissue. In  spite of this there is considerable improvement in the overall dimensions and depth of the wound. Islands of epithelialization are seen especially medially inferiorly and superiorly to a lesser extent. She is using Hydrofera Blue at home 03/29/16; surgical debridement done to remove surface eschar and nonviable subcutaneous tissue. This cleans up quite nicely mention slightly larger in terms of length and width however depth is less 04/12/16; continued gradual improvement in terms of depth and the condition of the wound base. Debridement is done. Continuing long standing Hydrofera Blue at home with Kerlix Coban wraps 04/26/16; continued gradual improvement in terms of depth and management as well as condition of the wound base. Surgical debridement done she continues with Hydrofera Blue. 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 She initially came to this clinic venous insufficiency ulcers which have long since healed 05/17/16 continued improvement in terms of the depth and measurements of this wound although she has a tightly adherent fibrinous slough each time. We've been continued with long standing Hydrofera Blue which seems to done as well for this wound is many advanced treatment options. 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 She also has chronic venous insufficiency. She has an irritation on her lateral right ankle secondary to our wraps 05/10/16; wound appears to be smaller especially on the medial aspect and especially in the width. Wound was debridement surface looks better. She is also been in the hospital apparently with anemia again she tells me she had an endoscopy.  Since she got home after 3 days which I believe was sometime last week she has had an irritated painful area on the right lateral ankle surrounding the lateral malleolus 05/31/16; much more adherent surface slough today then recently although I don't think the dimensions of changed that much. A more aggressive debridement is required. The irritated area over her medial malleolus is more pruritic and painful and I don't think represents cellulitis 06/14/16; no major change in her wound dimensions however there is more tightly adherent surface slough which is disappointing. As well as she appears to have a new small area medially. Furthermore an irritated uncomfortable area on the lateral aspect of her right foot just below the lateral malleolus. 06/21/16; I'm seeing the patient back in follow-up for the new areas under her major wound on the right anterior leg. She has been using Hydrofera Blue to this area probably for several months now and although the dressings seem to be helping for quite a period of time I think things have stagnated lately. She comes in today with a relatively tight adherent surface slough and really no changes in the wound shape or dimension. The 2 small areas she had inferiorly are tiny but still open they seem improved this well. There is no uncontrolled edema and I don't think there is any evidence of cellulitis. 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 Patient has had vascular evaluations both venous and arterial. I have biopsied this area. There is no obvious infection. The worrisome thing today is that she seems to be developing areas of erythema and epithelial damage on the medial aspect of the right foot. Also to a lesser degree inferior to the actual wound itself. Again I see no obvious changes to suggest cellulitis however as this is the only treatable option I  will probably give her  antibiotics. 07/13/16 no major change in the lady's large anterior right leg wound. Still covered with a very tightly adherent surface slough which is difficult to debridement. There is less erythema around this, culture last week grew pseudomonas I gave her ciprofloxacin. The area on her lateral right malleolus looks better- 08/02/16 the patient's wounds continued to decline. Her original large anterior right leg wound looks deeper. Still adherent surface slough that is difficult to debridement. She has a small area just below this and a punched-out wound just below her lateral malleolus. In the meantime she is been in hospital with apparently an upper GI bleed on Plavix and aspirin. She is now just on Plavix she received 3 units of packed cells 08/23/16; since I last saw this 3 weeks ago, the open large area on her right leg looks about the same syrup. She has a small satellite lesion just underneath this. The area on her medial right ankle is now a deep necrotic wound. I attempted to debridement this however there is just too much pain. 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 She follows with vascular surgery and is scheduled for an angiogram in early September 09/06/16; the patient is going for an arteriogram tomorrow. Her original large wound on the right calf is about the same the satellite lesion underneath it is about the same however the area on her medial ankle is now deeper with exposed tendon. I am no longer attempting to debride these wounds 09/20/16; the patient has undergone a right femoral endarterectomy and Dacron patch angioplasty. This seems to have helped the flow in her right leg. 10/04/16; Arrived today for aggressive debridement of the wounds on her right calf the original wound the one beneath it and a difficult area over her right lateral leg just above the lateral malleolus. 10/25/16;  her 3 open wounds are about the same in terms of dimensions however the surface appears a lot healthier post debridement. Using Iodoflex 10/18/16 we have been using Iodoflex to her wounds which she tolerated with some difficulty. 10/11/16; has been using Santyl for a period of time with some improvement although again very adherent surface slough would prevent any attempted healing this. She has a original wound on the left calf, the satellite underneath that and the most recent wound on the right medial ankle. She has completed revascularization by Dr. early and has had venous ablation earlier. Want to go back to Iodoflex to see if week and get a healthier surface to this wound bed failing this I think she'll need to be taken to the OR and I am prepared to call Dr. Marla Roe to discuss this. She is obviously not a good candidate for general anesthesia however.; 11/08/16; I put her on Iodoflex last time to see if I can get the wound bed any healthier and unfortunately today still had tremendous surface slough. 11/15/16; 4 weeks' worth of Iodoflex with not much improvement. Debridement on the major wounds on her left anterior leg is easier however this does not maintain from week to week. The punched-out area on her medial right ankle 11/29/16; I attempted to change the patient last visit to [facility] however she states this burned and was very uncomfortable therefore we gave her permission to go back to the eye out of complex which she already had at home. Also she noted a lot of pain and swelling on the lateral aspect of her  leg before she traveled to [facility] for the holidays, I called her in doxycycline over the phone. This seems to have helped 12/06/16; Wounds unchanged by in large. Using Iodoflex 12/13/16; her wounds today actually looks somewhat better. The area on the right lateral lower leg has reasonably healthy-looking granulation and perhaps as actually filled and a  bit. Debridement of the 2 wounds on the medial calf is easier and post debridement appears to have a healthier base. We have referred her to Dr. Migdalia Dk for consideration of operative debridement 12/20/16; we have a quick note from Dr. Merri Ray who feels that the patient needs to be referred to an academic center/plastic surgeon. This is due to the complexity of the patient's medical issues as it applies to anything in the OR. We have been using Iodoflex 12/27/16; in general the wounds on the right leg are better in terms of the difficult to remove surface slough. She has been using Iodoflex. She is approved for Apligraf which I anticipated ordering in the next week or 2 when we get a better-looking surface 01/04/16 the deep wounds on the right leg generally look better. Both of them are debrided further surface slough. The area on the lateral right leg was not debrided. She is approved for Apligraf I think I'll probably order this either next week or the week after depending on the surface of the wounds superiorly. We have been using Iodoflex which will continue until then 01/11/16; the deep wounds on the right leg again have a surface slough that requires debridement. I've not been able to get the wound bed on either one of these wounds down to what would be acceptable for an advanced skin stab-like Apligraf. The area on the medial leg has been improving. We have been using hot Iodoflex to all wounds which seems to do the best at at least limiting the nonviable surface 01/24/17; we have continued Iodoflex and all her wound areas. Her debridement Gen. he is easier and the surface underneath this looks viable. Nevertheless these are large area wounds with exposed muscle at least on the anterior parts. We have ordered Apligraf's for 2 weeks from now. The patient will be away next week 02/07/17; the patient was close to have first Apligraf today however we did not order one. I therefore replaced her  Iodoflex. She essentially has 3 large punched- out areas on her right anterior leg and right medial ankle. 02/11/17; Apligraf #1 02/25/17; Apligraf #2. In general some improvement in the right medial ankle and right anterior leg wounds. The larger wound medially perhaps some better 03/11/17; Apligraf #3. In general continued improvement in the right medial ankle and the right anterior leg wounds. 03/25/17 Apligraf #4. In general continued improvement especially on the right medial ankle and the lower 04/08/17; Apligraf #5 in general continued improvement in all wound sites. 04/22/17; post Apligraf #5 her wound beds continued to look a lot better all of them up to the surface of the surrounding skin. Had a caramel-colored slough that I did not debrided in case there is residual Apligraf effect. The wounds are as good as I have seen these looking quite some time. 04/29/17; we applied Presho and last week after completing her Apligraf. Wounds look as though they've contracted somewhat although they have a nonviable surface which was problematic in the past. Apparently she has been approved for further Apligraf's. We applied Iodosorb today after debridement. 05/06/17; we're fortunate enough today to be able to apply additional  Apligraf approved by her insurance. In general all of her wounds look better Apligraf #6 05/24/17; Apligraf #7 continued improvement in all wounds 3 06/10/17 Apligraf #8. Continued improvement in the surface of all wounds. Not much of an improvement in dimensions as I might a follow 06/24/17 Apligraf #9 continued general improvement although not as much change in the wound areas I might of like. She has a new open area on the right anterior lateral ankle very small and superficial. She also has a necrotic wound on the tip of her right index finger probably secondary to severe uncontrolled Raynaud's phenomenon. She is already on sildenafil and already seen her rheumatologist who gave her  Keflex. 07/08/17; Apligraf #10. Generalized improvement although she has a small additional wound just medial to the major wound area. 07/22/17; after discussion we decided not to reorder any further Apligraf's although there is been considerable improvement with these it hasn't been recently. The major wound anteriorly looks better. Smaller wounds beneath this and the more recent one and laterally look about the same. The area on the right lateral lower leg looks about the same 07/29/17 this is a patient who is exceedingly complex. She has advanced scleroderma, crest syndrome including calcinosis, PAD status post revascularization, chronic venous insufficiency status post ablations. She initially presented to this clinic with wounds on her bilateral lower legs however these closed. More recently we have been dealing with a large open area superiorly on the right anterior leg, a smaller wound underneath this and then another one on the right medial lower extremity. These improve significantly with 10 Apligraf applications. Over the last 2-3 weeks we are making good progress with Hydrofera Blue and these seem to be making progress towards closure 08/19/17; wounds continued to make good improvement with Hydrofera Blue and episodic aggressive debridements 08/26/17; still using Hydrofera Blue. Good improvements 09/09/17; using Hydrofera Blue continued improvement. Area on the lateral part of her right leg has only a small remaining open area. The small inferior satellite region is for all intents and purposes closed. Her major wound also is come in in terms of depth and has advancing epithelialization. 09/16/17; using Hydrofera Blue with continued improvement. The smaller satellite wound. We've closed out today along with a new were satellite wound medially. The area on her medial ankle is still open and her major wound is still open but making improvement. All using Hydrofera Blue. Currently 09/30/17; using  Hydrofera Blue. Still a small open area on the lateral right ankle area and her original major wound seems to be making gradual and steady improvement. 10/14/17; still using Hydrofera Blue. Still too small open areas on the right lateral ankle. Her original major wound is horizontal and linear. The most problematic area paradoxically seems to be the area to the medial area wears I thought it would be the lateral. 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 10/28/17; still using Hydrofera Blue. Right lateral ankle has a very small open area superiorly on the most lateral part of the wound. Her original open wound has 2 open areas now separated by normal skin and we've redefined this. 11/11/17; still using Hydrofera Blue area and right lateral ankle continues to have a small open area on mostly the lateral part of the wound. Her original wound has 2 small open areas now separated by a considerable amount of normal skin 11/28/17; the patient called in slightly before Thanksgiving to report pain and erythema above the  wound on the right leg. In the past this is responded well to treatment for cellulitis and I gave her over the phone doxycycline. She stated this resulted in fairly abrupt improvement. We have been using Hydrofera Blue for a prolonged period of time to the larger wound anteriorly into the remaining wound on her right right lateral ankle. The latter is just about closed with only a small linear area and the bottom of the [place]. 12/02/17; use endoform and left the dressing on since last visit. There is no tenderness and no evidence of infection. 12/16/17; patient has been using Endoform but not making much progress. The 2 punched out open areas anteriorly which were the reminiscence of her major wound appear deeper. The area on the lateral aspect of her right calf also appears deeper. Also she has a puzzling tender swelling above her wound on the  right leg. This seems larger than last time. Just above her wounds there appears to be some fluctuance in this area it is not erythematous and there is no crepitus 12/30/17; patient has been using Endoform up until last week we used Hydrofera Blue. Ultrasound of the swelling above her 2 major wounds last time was negative for a fluid collection. I gave her cefaclor for the erythema and tenderness in this area which seems better. Unfortunately both punched out areas anteriorly and the area on her right medial lower leg appear deeper. In fact the lateral of the wounds anteriorly actually looks as though it has exposed tendon and/or muscle sheath. She is not systemically unwell. She is complaining of vaginitis type symptoms presumably Candida from her antibiotics. 01/06/18; we're using santyl. she has 2 punched out areas anteriorly which were initially part of a large wound. Unfortunately medially this is now open to tendon/muscle. All the wounds have the same adherent very difficult to debride surface. 01/20/18; 2 week follow-up using Santyl. She has the 2 punched out areas anteriorly which were initially part of her large surface wound there. Medially this still has exposed muscle. All of these have the same tightly adherent necrotic surface which requires debridement. PuraPly was not accepted by the patient's insurance however her insurance I think it changed therefore we are going to run Apligraf to gain 02/03/18; the patient has been using Santyl. The wound on the right lateral ankle looks improved and the 2 areas anteriorly on the right leg looks about the same. The medial one has exposed muscle. The lateral 1 requiresdebridement. We use PuraPly today for the first week 02/10/18; PuraPly #2. The patient has 3 wounds. The area on the right lateral ankle, 2 areas anteriorly that were part of her original large wound in this area the medial one has exposed muscle. All of the wounds were lightly debrided with  a number 3 curet. PuraPly #2 applied the lateral wound on the calf and the right lateral ankle look better. 02/17/18; PuraPly #3. Patient has 3 wounds. The area on the right lateral ankle in 2 areas internally that were part of her original large wound. The lateral area has exposed muscle. She arrived with some complaints of pain around the right ankle. 02/24/18; PuraPly #4; not much change in any of the 3 wound areas. Right lateral ankle, right lateral calf. Both of these required debridement with a #3 curet. She tolerates this marginally. The area on the medial leg still has exposed muscle. Not much change in dimensions 03/03/18 PuraPly #5. The area on the medial ankle actually looks better however  the 2 separate areas that were original parts of the larger right anterior leg wound look as though they're attempting to coalesce. 03/10/18; PuraPly #6. The area on the medial ankle actually continues to look and measures smaller however the 2 separate areas that were part of the original large wound on the right anterior leg have now coalesced. There hasn't been much improvement here. The lateral area actually has underlying exposed muscle 03/17/18-she is here in follow-up evaluation for ulcerations to the right lower extremity. She is voicing no complaints or concerns. She tolerated debridement. Puraply#7 placed 03/24/18; difficult right lower extremity ulcerations. PuraPly #8 place. She is been approved for [employer]. She did very well with Apligraf today however she is apparently reached her "lifetime max" 03/31/18; marginal improvement with PuraPly although her wounds looked as good as they have in several weeks today. Used TheraSkins #1 04/14/18 TheraSkin #2 today 04/28/18 TheraSkins #3. Wound slightly improved 05/12/18; TheraSkin skin #4. Wound response has been variable. 05/27/18 TheraSkin #5. Generally improvement in all wound areas. I've also put her in 3 layer compression to help with the severe venous  hypertension 06/09/18; patient is done quite well with the TheraSkins unfortunately we have no further applications. I also put her in 3 layer compression last week and that really seems to of helped. 06/16/18; we have been using silver collagen. Wounds are smaller. Still be open area to the muscle layer of her calf however even that is contracted somewhat. She tells me that at night sometimes she has pain on the right lateral calf at the site of her lower wound. Notable that I put her into 3 layer compression about 3 weeks ago. She states that she dangles her leg over the bed that makes it feel better but she does not describe claudication during the day She is going to call her secondary insurance to see if they will continue to cover advanced treatment products I have reviewed her arterial studies from 01/22/17; this showed an ABI in the right of 1 and on the left noncompressible. TBI on the right at 0.30 on the left at 0.34. It is therefore possible she has significant PAD with medial calcification falsely elevating her ABI into the normal range. I'll need to be careful about asking her about this next week it's possible the 3 layer compression is too much 06/23/18; was able to reapply TheraSkin 1 today. Edema control is good and she is not complaining of pain no claudication 07/07/18;no major change. New wound which was apparently a taper removal injury today in our clinic between her 2 wounds on the right calf TheraSkins #2 07/14/18; I think there is some improvement in the right lateral ankle and the medial part of her wound. There is still exposed muscle medially. 07/28/18; two-week follow-up. TheraSkins#3. Unfortunately no major change. She is not a candidate I don't think for skin grafting due to severe venous hypertension associated with her scleroderma and pulmonary hypertension 08/11/18 Patient is here today for her [employer]askin application #06 (#5 of the second set). She seems to be doing well and  in the base of the wound appears to show some progress at this point. This is the last approved Theraskin of the second set. 08/25/18; she has completed TheraSkin. There has been some improvement on the right lateral calf wound as well as the anterior leg wounds. The open area to muscle medially on the anterior leg wound is smaller. I'm going to transition her back to [facility] under [employer]  Coban change every second day. She reports that she had some calcification removed from her right upper arm. We have had previous problems with calcifications in her wounds on her legs but that has not happened recently 09/08/18;using Hydrofera Blue on both her wound areas. Wounds seem to of contracted nicely. She uses Kerlix Coban wrap and changes at home herself 09/22/2018; using Hydrofera Blue on both her wound areas. Dimensions seem to have come down somewhat. There is certainly less depth in the medial part of the mid tibia wound and I do not think there is any exposed muscle at this point. 10/06/2018; 2-week follow-up. Using [facility] on her wound areas. Dimensions have come down nicely both on the right lateral ankle area in the right mid tibial area. She has no new complaints 10/20/2018; 2-week follow-up. She is using Hydrofera Blue. Not much change from the last time she was here. The area on the lateral ankle has less depth although it has raised edges on one side. I attempted to remove as much of the raised edge as I could without creating more additional wounds. The area on the right anterior mid tibia area looks the same. 11/03/2018; 2-week follow-up she is using Hydrofera Blue. On the right anterior leg she now has 2 wounds separated by a large area of normal skin. The area on the medial part still has I think exposed muscle although this area itself is a lot smaller. The area laterally has some depth. Both areas with necrotic debris. The area on her right lateral ankle has come in  nicely 11/17/2018; patient continues to use Hydrofera Blue. We have been increasing separation of the 2 wounds anteriorly which were at one point joint the area on the right lateral calf continues to have I think some improvement in depth. 12/08/2018; patient continues to use Hydrofera Blue. There is some improvement in the area on the right lateral calf. The 2 areas that were initially part of the original large wound in the mid right tibia are probably about the same. In fact the medial area is probably somewhat larger. We will run puraply through the patient's insurance 12/22/2018; she has been using Hydrofera Blue. We have small wounds on the right lateral calf and 2 small areas that were initially part of a large wound in the right mid tibia. We applied pure apply #1 today. 12/29/2018; we applied puraply #2. Her wounds look somewhat better especially on the right lateral calf and the lateral part of her original wound in the mid tibial area. 01/05/2019; perhaps slightly improved in terms of wound bed condition but certainly not as much improvement as I might of liked. Puraply #3 1/13: we did not have a correct sized puraply to apply. wounds more pinched out looking, I increased her compression to 3 layer last week to help with significant multilevel venous hypertension. Since then I've reviewed her arterial status. She has a right femoral endarterectomy and a distal left SFA stent. She was being followed by Dr. [name] for a period however she does not appear to have seen him in 3 years. I will set up an appointment. 1/20. The patient has an additional wound on the right lateral calf between the distal wound and proximal wounds. We did not have Puraply last week. Still does not have a follow-up with Dr. early 1/27: Follow-up with Dr. early has been arranged apparently with follow-up noninvasive studies. Wounds are measuring roughly the same although they certainly look smaller 2/3; the patient  had  non invasive studies. Her ABIs on the right were 0.83 and on the left 1.02 however there was no great toe pressure bilaterally. Also worrisome monophasic waveforms at the PTA and dorsalis pedis. We are still using Puraply. We have had some improvement in all of the wounds especially the lateral part of the mid tibial area. 2/10; sees Dr. early of vein and vascular re-arterial studies next week. Puraply reapplied today. 2/17; Dr. early of vein and vascular his appointment is tomorrow. Puraply reapplied after debridement of all wounds 2/24; the patient saw Dr. early I reviewed his note. sHe noted the previous right femoral endarterectomy with a Dacron patch. He also noted the normal ABI and the monophasic waveforms suggesting tibial disease. Overall he did not feel that she had any evidence of arterial insufficiency that would impair her wound healing. He did note her venous disease as well. He suggested PRN follow-up. 3/2; I had the last puraply applied today. The original wounds over the mid tibia area are improved where is the area on the right lower leg is not 3/9; wounds are smaller especially in the right mid tibia perhaps slightly in the right lateral calf. We finished with puraply and went to endoform today 3/23; the patient arrives after 2 weeks. She has been using endoform. I think all of her wounds look slightly better which includes the area on the right lateral calf just above the right lateral malleolus and the 2 in the right mid tibia which were initially part of the same wound. 4/27; TELEHEALTH visit; the patient was seen for telehealth visit today with her consent in the middle of the worldwide epidemic. Since she was last here she called in for antibiotics with pain and tenderness around the area on the right medial ankle. I gave her empiric doxycycline. She states this feels better. She is using endoform on both of these areas 5/11 TELEHEALTH; the patient was seen for telehealth visit  today. She was accompanied at home by her husband. She has severe pulmonary hypertension accompanied scleroderma and in the face of the Covid epidemic cannot be safely brought into our clinic. We have been using endoform on her wound areas. There are essentially 3 wound areas now in the left mid tibia now 2 open areas that it one point were connected and one on the right lateral ankle just above the malleolus. 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 She cannot come to clinic. The wounds were reviewed today via telehealth. She has severe chronic venous hypertension which I think is centrally mediated. She has wounds on her right anterior tibia and right lateral ankle area. These are chronic. She has been using endoform. 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 We have been following her from telehealth. She has severe chronic venous hypertension which may be mostly centrally mediated secondary to right heart heart failure. She has wounds on her right anterior tibia and right lateral ankle these are chronic we have been using endoform 6/22; TELEHEALTH; this patient was seen today via telehealth. 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 Since the last time we had  contact with this patient she developed some pain and erythema around the wound on her right lateral malleolus/ankle and we put in antibiotics for her. This is resulted in good improvement with resolution of the erythema and tenderness. I changed her to silver alginate last time. We had been using endoform for an extended period of time 7/13;  TELEHEALTH; this patient was seen today via telehealth. 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 She would continue to be at a prohibitive risk to be brought into our clinic unless this was absolutely necessary. These visits have been done with her approval as well as her husband. We have been using silver alginate to the areas on the right mid tibia and right lateral lower leg. 7/27 TELEHEALTH; patient was seen along with her husband today via telehealth. 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 We changed her to sample last visit. She has 2 areas a chronic wound on her right mid tibia and one just above her ankle. These were not the original wounds when she came into this clinic but she developed them during treatment 8/17; she comes in for her first face-to-face visit in a long period. She has a remaining area just medial to the right tibia which is the last open part of her large wound across this area. She also has an area on the right lateral lower leg. We prescribed Santyl last telehealth visit but they were concerned that this was making a deeper so they put silver alginate on it last week. Her husband changes the dressings. 8/31; using Santyl to the 2 wound areas some improvement in wound surfaces. Husband has surgery in 2 weeks we will put her out 3 weeks. Any of the advanced treatment options that I can think of that would be eligible for this wound would also cause her to have to come in weekly. The risk that the patient is just too high 9/21. Using Santyl to the 2 wound areas. Both of these are somewhat better although the medial mid tibia area still has exposed muscle. Lateral ankle requiring debridement. Using Santyl 10/12; using Santyl to the 2 wound areas. One on the right lateral ankle and the other in the medial calf which still has exposed muscle. Both  areas have come down in size and have better looking surfaces. She has made nice progress with santyl 11/2; we have been using Santyl to the 2 wound areas. Right lateral ankle and the other in the mid tibia area the medial part of this still has exposed muscle. 11/23/2019 on evaluation today patient appears to be doing about the same really with regard to her wounds. She is actually not very pleased with how things seem to be progressing at this point she tells me that she really has not noted much improvement unfortunately. With that being said there is no signs of active infection at this time. There is some slough buildup noted at this time which again along with some dry skin around the edges of the wound I think would benefit her to try to debride some of this away. Fortunately her pain is doing fairly well. She still has exposed muscle in the right medial/tibial area. 12/14; TELEHEALTH; she was changed to [facility] to the right calf wound and right lateral ankle wound when she was here last time. Unfortunately since then she had a fall with a  pelvic fracture and a fracture of her wrist. She was apparently hospitalized for 5 days. I have not looked at her discharge summary. She apparently came out of the hospital with a blister on her right heel. She was seen today via telehealth by myself and our case manager. The patient and her husband were present. She has been using Hydrofera Blue at our direction from the last time she was in the clinic. There is been no major improvement in fact the areas appear deeper and with a less viable surface 01/04/2020; TELEHEALTH; the patient was seen today in accompaniment of her husband and our nurse. She has 3 open areas 1 on the right medial mid tibia, one on the right lateral ankle and a large eschared pressure area on the right heel. We have been using Iodoflex to the 2 original wounds. The patient has advanced scleroderma chronic respiratory failure on  oxygen. It is simply too perilous for her to be seen in any other way 1/26; TELEHEALTH; the patient was seen today in accompaniment of her husband and our RN one of our nurses. She still has the 3 open areas 1 on the right medial mid tibia which is the remanent of a more extensive wound in this area, one on the right lateral ankle and a large eschared pressure area on the right heel. We have been using Iodoflex to the 2 original wounds and a bed at 9 application to the eschared area on the heel 2/15; the first time we have seen this patient and then several months out of concern for the pandemic. She had a large horizontal wound in the mid tibia. Only the medial aspect of this is still open. Area on the lateral ankle is just about closed. She had a new pressure ulcer on the right heel which I have removed some of the eschar. We have been using Iodoflex which I will continue. The area in the mid tibia has a round circle in the middle of exposed muscle. I think we would have to use an advanced treatment product to stimulate granulation over this area. We will run this through her insurance. She is not eligible for plastic surgery for many reasons 3/1; TELEHEALTH. The patient was seen today by telehealth. 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 We have been using Iodoflex to the wound areas which include the right anterior mid tibia, right lateral ankle and the right heel. All of these were reviewed 3/8; the patient's wound just above the right ankle is closed. She still has a contracting black eschar on the heel where she had a pressure ulcer. The medial part of her original wound on the mid tibia has exposed muscle. We have made really made no progress in this area although we have managed to get a lot of the original wound in this area to close. We used Apligraf #1 today 3/22; the patient's ankle wound  remains closed. She still has a contracting black eschar on the heel although it seems to have less surface area. It still not clear whether there is any depth here. We used Apligraf #2 today in an attempt to get granulation over the exposed muscle and what is left of her mid tibia area. 4/5; everything is closed except the medial aspect of the mid tibia wound, as well as the pressure injury on the right heel. She has been using Betadine to the right  heel which has been gradually contracting. We used Apligraf #3 today. 4/19; Apligraf #4. Still a pressure area on the right heel she has been using Betadine superficial excoriated areas she has been applying salicylic acid to on the anterior leg and the thick horn on the leg just above her wound area 5/3; Apligraf #5. Unfortunately she came in today with a reopening of the area on the right lateral lower leg. Not much change in the wound we have been treating in the mid calf. Quite a bit of edema surrounding this wound. She reports that she was up on her feet quite a bit. The area on the right heel is separating she has been using Betadine She comes in with a new wound on the left lateral malleolus which is very disappointing this is been open since last week 5/17; we applied her last Apligraf last time. Unfortunately that did not have any effect on the deep area medially in the mid tibial area. However the area over the right lateral malleolus is a lot better. Right heel also is contracted we used Iodoflex last time. The new wound on the left lateral malleolus were using Santyl to. This required debridement. 6/1. Not much change in the area on the right medial mid tibia. Right lateral ankle is improved she has the necrotic area on the right heel which was a pressure area. New area on the left lateral malleolus from last time. I changed her to Iodoflex to the area on the left lateral malleolus she said this hurt I have put her back on Santyl 6/15;  right mid tibia is unchanged. I do not think there is anything I can do to this topically that we will get this to granulate over the muscle. And I am furthermore I am not sure that she is a surgical candidate either because of her severe pulmonary issues or because she really does not want to go through it. The area on the right lateral malleolus is a small punched out area. The area on the left lateral malleolus again small punched out with nonviable surface Pressure ulcer on the right heel at separating black eschar. I went ahead and remove this today she has an area on the left anterior mid tibia just laterally. Geographic wounds debris on the surface. Some erythema here. 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 There is skin changes widely Change the primary dressing to Sorbact to all wound areas. She needs compression on the left leg as well as the right 6/21; right mid tibia which was her one remaining wound at 1 point is unchanged The area on the right lateral malleolus actually is close to closing over still a small punched out area The area on the left lateral malleolus again a deep wound it is this 1 that she feels pain in. Pressure ulcer on the right heel was cultured today. New area on the left anterior mid tibia several geographic wounds last week in the setting of chronic venous insufficiency this actually looks some better 7/6; Right mid tibia about the same. Exposed muscle Left lateral malleolus again necrotic debris over the surface. Pressure ulcer on the right heel. She finished the Keflex we prescribed. Necrotic debris debrided with a #3 curette The rest of her wounds on the right mid tibia right lateral ankle are better Her husband reminds me that she had a right femoral endarterectomy and has 2 stents in her left thigh placed in 2011 approximately by vascular surgery  in [place]. She saw Dr. [name] last in February 2020. At that point he did not think  that the arterial insufficiency she had on the right was sufficient to explain any of her right lower extremity wounds however she now has an area on the left lateral malleolus that looks like an ischemic wound 7/12; we have been using Sorbact all wound areas Right mid tibia about the same exposed muscle Left lateral malleolus small wound with debris in the surface punched out Pressure ulcer of the right heel requiring debridement of necrotic debris Lateral ankle wound on the right is just about closed Right mid tibia wound about the same still with exposed muscle. 7/26 really no change in any wounds. We have been using Sorbact. She has bilateral lower extremity wounds. This is in the setting of scleroderma, severe venous hypertension. She also has known PAD and I had really hoped to be able to get a review by Dr. [name] but apparently that is not booked until August 31. I have asked her to call back to vein and vascular and get an appointment with somebody a little earlier if that is possible. 8/16; patient's area on the right ankle which was a chronic wound is closed over again. The area on the right mid lower leg, right heel left lateral ankle are all about the same. Pressure ulcer on the right heel is still not viable. New areas identified on the right lateral calf. She has a follow-up with Dr. [name] on 8/31. Phenomenally I would like him to go over her arterial status with regards to her underlying stents. 9/13; patient had her ARTERIAL studies done however apparently Dr. [name] is now in [place] so she did not get an appointment with him on 8/31 and was not referred to any other provider. On the right side her PTA was monophasic. Noncompressible. TBI on the right at 0.41 on the left she was monophasic and noncompressible they did not do a TBI because she had a Band-Aid on her big toe. Patient does not describe claudication although she is having a lot of pain in the left lateral  malleolus. 10/11; the patient had a right-sided interventions on 10/5. She had a angioplasty of the right anterior tibial artery as well as the tibioperoneal trunk/peroneal artery she had a drug-coated balloon angioplasty of the right superficial femoral artery. On the left she did not have any interventions yet but is going to have another look at this in 2 weeks. The superficial femoral artery on the left and the associated stent are patent popliteal artery is patent to the dominant vessel runoff is the anterior tibial which has several high-grade stenosis in the proximal one third. We have been using Sorbact. She also has a new area on the right upper mid tibia. 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 She tolerated this well. She is using Iodoflex and her husband is doing a kerlix Coban on her. She arrives in clinic today with the original medial part of her mid tibia wound with muscle exposure small area on the right ankle which was part of her original wound she has 2 punched-out areas laterally and 1 above her area anteriorly. Nonviable surfaces similarly the right heel is nonviable. On the left side she  has an area on the left foot and the left lateral ankle similar nonviable surfaces. 11/8; patient arrives in clinic today with nothing improved. She has a multiple lightly group of wounds on the right lower leg presumably different etiologies including a skin biopsy, the original wound she had medially and some punched-out areas that are not of clear defined etiology. She says she was in a lot of pain this week could not have her leg up on the bed did not get completely relief from dropping it over the bed. She also has a small area on the left  lateral malleolus and the left lateral foot necrotic surfaces on all of these wounds. We have been using Iodoflex I attempted to put her in 3 layer compression last week that did not go well. We will be backing off on kerlix Coban this week. 11/15; the ultrasound that I sent the patient for last week showed findings consistent with acute deep vein thrombosis involving the right distal femoral vein and right popliteal vein. I was concerned about the combination of Plavix and Xarelto however apparently Dr. [name] felt that was not a problem. She is now going through the acute dosing of Xarelto. She has an appointment with Dr. Trula Slade on 12/5. 11/29; 10 days ago before he left for weeks vacation she called to report pain in the right leg I sent her in some doxycycline her husband thought things were getting better however she required admission to hospital from 11/22 through 11/24 prompted by finding that her hemoglobin was 5.7 she was transfused 2 units of packed cells and her hemoglobin stabilized. She did undergo an endoscopy with findings of significant esophagitis part of which was candidal and part of it reflux. She was discharged on nystatin swish and swallow which she is doing. She seems to be better from that regard. There was no active other active source of bleeding Unfortunately the combination of Xarelto and Plavix was felt to be too much for her. The Xarelto was stopped and an IVC filter was placed she is continued on Plavix. Her big complaint today is pain in the right calf really from the mid aspect superiorly. This is very tender slightly red but not particularly warm. She did see vein and vascular in the hospital I will see if I can pull their note 12/6; patient was at Dr. Stephens Shire office today. Great toe pressure on the right is 54 on the left 49 she has a 50 to 74% stenosis of the right mid popliteal on the left at 30 to 49% proximal SFA and a 50 to 74% CFA. She was treated with  angioplasty of the right ATA, right TP trunk and peroneal artery as well as an angioplasty of the right SFA to popliteal artery. On 10/26 she underwent angioplasty of the left ATA and left TP trunk she has a history of a left common femoral endarterectomy and left superficial femoral artery stent as well as a right superficial femoral artery stent. In addition to her arterial disease she has significant venous disease with a history of a right greater saphenous vein ablation and a recent DVT in the right femoral and popliteal vein with insertion of an IVC filter. She has remained on Plavix but her anticoagulant was stopped. She continues to have a lot of swelling and pain in the right leg with significant erythema which I am assuming is stasis dermatitis. She has had substantial wound areas on the right leg including right lateral a large  necrotic wound with a smaller biopsy site above this. She has original wound on the left mid tibia with a small area above this as well. On the left she has a small area over the left lateral malleolus and the left lateral foot 12/22-week follow-up. Her right leg looks really terrible. She has another necrotic wound just above her original one on the right posterior leg. Greenish drainage suggestive of gram-negative/Pseudomonas infection. The entire mid part of her leg is erythematous I think most of this is stasis dermatitis rather than cellulitis. She has also a bothersome ischemic-looking right fourth toe which I noticed today without her really complaining. She has not been systemically unwell although she looks more physically frail 12/27; I reached out to vein and vascular last weekend Dr. [name] will be seeing her on Thursday. She has a gangrenous area on the tip of the right fourth toe. Multiple areas breaking down. I think this is a combination of arterial and severe venous insufficiency she has multiple wounds in the right leg but not a lot of pain. She has 3  small areas on the left leg we have been using Santyl. On the right leg we have been using silver alginate or at least that is what they have been using Culture I did last week showed abundant is Pseudomonas aeruginosa had a few Enterococcus faecalis. I have been using Ceftin here predominantly because of the green drainage suggesting Pseudomonas I have not yet addressed the Enterococcus which was ampicillin sensitive 1/7; the patient was kindly seen by vein and vascular. She underwent a repeat ANGIOGRAM on 01/02/2021 by Dr. Donzetta Matters. She appears to have had bilateral common femoral treatments in the past. The right side is heavily heavily calcified and she has 1 area of 50% stenosis in the SFA that is nonflow limiting. She has dominant runoff via the anterior tibial and peroneal arteries. On the left side she also has a heavily calcified SFA no flow-limiting stenosis in the popliteal artery then she she then has runoff via the anterior tibial and posterior tibial arteries without flow-limiting stenosis. She was not felt to require any intervention. She continues to have pain in the right leg with erythema although the erythema is somewhat better. Green drainage coming from the wounds on the posterior lateral right leg which are clearly necrotic. I have had her on 2-1/2 weeks of cefdinir this is while we awaited further arterial evaluation which is completed. I have now turned some thought to the status of her venous system particularly the DVT she had in November. This was an acute DVT involving the right distal femoral and right popliteal veins. She was then started on Xarelto in combination with Plavix but she required hospitalization in November with GI bleeding. She had an endoscopy by Dr. Lizbeth Bark of GI. This showed moderately severe esophagitis a combination of both food particles reflux and Candida. Localized mild inflammation characterized by erythema was found in the cardiac gastric fundus and the  gastric body. The duodenal bulb's in the stages of the duodenum were normal. She then underwent a capsule endoscopy which showed evidence of oozing blood from the esophagus but no evidence of small bowel bleeding. She was not restarted on her Xarelto, she continues on Plavix and she has an IVC filter 01/12/2021; we brought this patient in for a nurse visit today however I saw her because of copious amounts of green drainage. Under 3 layer compression the degree of erythema in her lower leg on the  right was better however there is indeed large amounts of green drainage I think coming out of the large wound on the posterior lateral leg. There is also a satellite area above this. She complains of fairly constant pain in fact she called our clinic earlier in the week to report that Sorbact which we are using topically was hurting her. We explained that we had never heard Sorbact do this. She has not been systemically unwell. My thoughts are on this lady that she had recent angiograms and there is nothing further that can be done. She has a gangrenous right fourth toe but there should be enough blood flow to heal wounds on her legs. She did not have another intervention. She is on Plavix with an IVC filter for her previous stents. She had an acute DVT in November I believe and was on Xarelto and Plavix but had a GI bleed of I think esophageal source. She has some swelling in the right thigh marked erythema and swelling in the right mid calf. I wonder whether she is extended the clot burden in her thigh as a cause of the distal DVT like symptoms. 1/18; the patient's repeat duplex ultrasound did not show extension of her DVT in fact this had recanalized and was better. At my request she is seeing Dr. Oneida Alar on Thursday to look over the recent angiogram in the ischemic looking areas in her right fourth and today her right second toe. This foot is cold and I wonder whether she either has microvascular disease  and/or severe Raynaud's on top of her known macrovascular disease although her recent angiogram did not show anything that needed a particular procedure. The other issue has been copious amounts of drainage coming out of these large necrotic wounds on her right lateral calf. This is almost a jelly green color and consistency. PCR culture I did last week showed high amounts of Pseudomonas which was hardly surprising but also high amounts of Enterococcus faecalis. She is allergic to penicillin, Levaquin and sulfa. There is not a good oral combination of a single drug that would cover this. I am going to ask for infectious disease. About the only positive thing I can say about the right leg as she is not in as much pain as she was several weeks ago. I gave her a prolonged course of cefdinir orally and have increased her compression. The leg is certainly less red and less tender but she is beginning develop ischemic changes in her right forefoot 1/24; the patient went to see Dr. Oneida Alar who said from an arterial/macrovascular and venous standpoint he thought everything was optimized. He referred her to Dr. [name]. Dr. [name] felt he could debride I presume the major wound area areas on the right posterior lateral calf and place a wound matrix presumably ACell. She sees infectious disease tomorrow I think she needs antibiotics perhaps IV antibiotics. This is to address the culturing Pseudomonas and Enterococcus from PCR culture that we got 10 days ago. She also has a topical antibiotic compounded which we can apply to this area topically. The patient has expanding necrotic wounds on the right posterior lateral calf and multiple small areas anteriorly. This whole area was very angry and erythematous when she came back from her arterial evaluation. At that point I gave her 2 weeks of cefdinir with some improvement in the pain and the erythema but things have never gotten any better. The 2 larger areas have now  coalesced and looks like there  is expansion to surrounding tissue She has ischemic areas on 3 of her toes fourth first and second which I am assuming is secondary to scleroderma with Raynaud's phenomenon and small vessel disease. Electronic Signature(s) Signed: 01/23/2021 5:37:42 PM By: Linton Ham MD Entered By: Linton Ham on 01/23/2021 12:49:32 -------------------------------------------------------------------------------- Physical Exam Details Patient Name: Date of Service: [name] 01/23/2021 10:30 A M Medical Record Number: [id number] Patient Account Number: [id number] Date of Birth/Sex: Treating RN: Dec 19, 1951 (70 y.o. [name] Primary Care Provider: [name] Other Clinician: Referring Provider: Treating Provider/Extender: [name] in Treatment: 461 Notes Wound exam The right lateral posterior calf has deteriorated quite a bit. The wounds have now expanded and coalesced. Necrotic tissue around the edges. She could not tolerate debridement in our clinic. Several small areas of skin that look like they are opening anteriorly. She has gangrenous right fourth toe first and second gangrenous changes at the tips. On the left 2 small areas on the lateral foot and ankle look about Electronic Signature(s) Signed: 01/23/2021 5:37:42 PM By: Linton Ham MD Entered By: Linton Ham on 01/23/2021 12:50:39 -------------------------------------------------------------------------------- Physician Orders Details Patient Name: Date of Service: [name], [name] 01/23/2021 10:30 A M Medical Record Number: [id number] Patient Account Number: [id number] Date of Birth/Sex: Treating RN: 01-Apr-1951 (70 y.o. 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 Bathing/ Shower/ Hygiene May shower and wash wound with soap and water. - on days that dressing is changed Edema Control - Lymphedema / SCD / Other Bilateral Lower Extremities Elevate legs to the level of the heart or above for 30 minutes daily and/or when sitting, a frequency of: - throughout the day Avoid standing for long periods of time. Exercise regularly Wound Treatment Wound #16 - Calcaneus Wound Laterality: Right Cleanser: Soap and Water Every Other Day/30 Days Discharge Instructions: May shower and wash wound with dial antibacterial soap and water prior to dressing change. Peri-Wound Care: Sween Lotion (Moisturizing lotion) Every Other Day/30 Days Discharge Instructions: Apply moisturizing lotion as directed Topical: Gentamicin Every Other Day/30 Days Discharge Instructions: As directed by physician Topical: Keystone antibiotic Every Other Day/30 Days Discharge Instructions: Once received, stop the gentamicin and Apply Keystone under the calcium alginate with silver. Prim Dressing: KerraCel Ag Gelling Fiber Dressing, 4x5 in (silver  alginate) (Generic) Every Other Day/30 Days ary Discharge Instructions: Apply silver alginate to wound bed as instructed Secondary Dressing: Woven Gauze Sponge, Non-Sterile 4x4 in (Generic) Every Other Day/30 Days Discharge Instructions: Apply over primary dressing as directed. Secondary Dressing: ABD Pad, 8x10 Every Other Day/30  Days Discharge Instructions: Apply over primary dressing as directed. Secondary Dressing: CarboFLEX Odor Control Dressing, 4x4 in Every Other Day/30 Days Discharge Instructions: Apply over primary dressing as directed. Compression Wrap: Kerlix Roll 4.5x3.1 (in/yd) (Generic) Every Other Day/30 Days Discharge Instructions: Apply Kerlix and Coban compression as directed. Compression Wrap: Coban Self-Adherent Wrap 4x5 (in/yd) (Generic) Every Other Day/30 Days Discharge Instructions: Apply over Kerlix as directed. Wound #19 - Malleolus Wound Laterality: Right, Lateral Cleanser: Soap and Water Every Other Day/30 Days Discharge Instructions: May shower and wash wound with dial antibacterial soap and water prior to dressing change. Peri-Wound Care: Sween Lotion (Moisturizing lotion) Every Other Day/30 Days Discharge Instructions: Apply moisturizing lotion as directed Topical: Gentamicin Every Other Day/30 Days Discharge Instructions: As directed by physician Topical: Keystone antibiotic Every Other Day/30 Days Discharge Instructions: Once received, stop the gentamicin and Apply Keystone under the calcium alginate with silver. Prim Dressing: KerraCel Ag Gelling Fiber Dressing, 4x5 in (silver alginate) (Generic) Every Other Day/30 Days ary Discharge Instructions: Apply silver alginate to wound bed as instructed Secondary Dressing: Woven Gauze Sponge, Non-Sterile 4x4 in (Generic) Every Other Day/30 Days Discharge Instructions: Apply over primary dressing as directed. Secondary Dressing: ABD Pad, 8x10 Every Other Day/30 Days Discharge Instructions: Apply over primary  dressing as directed. Compression Wrap: Kerlix Roll 4.5x3.1 (in/yd) (Generic) Every Other Day/30 Days Discharge Instructions: Apply Kerlix and Coban compression as directed. Compression Wrap: Coban Self-Adherent Wrap 4x5 (in/yd) (Generic) Every Other Day/30 Days Discharge Instructions: Apply over Kerlix as directed. Wound #20 - Malleolus Wound Laterality: Left, Lateral Cleanser: Soap and Water 1 x Per Week/7 Days Discharge Instructions: May shower and wash wound with dial antibacterial soap and water prior to dressing change. Peri-Wound Care: Sween Lotion (Moisturizing lotion) 1 x Per Week/7 Days Discharge Instructions: Apply moisturizing lotion as directed Prim Dressing: IODOFLEX 0.9% Cadexomer Iodine Pad 4x6 cm 1 x Per Week/7 Days ary Discharge Instructions: Apply to wound bed as instructed Secondary Dressing: Woven Gauze Sponge, Non-Sterile 4x4 in (Generic) 1 x Per Week/7 Days Discharge Instructions: Apply over primary dressing as directed. Secondary Dressing: ABD Pad, 8x10 1 x Per Week/7 Days Discharge Instructions: Apply over primary dressing as directed. Compression Wrap: Kerlix Roll 4.5x3.1 (in/yd) (Generic) 1 x Per Week/7 Days Discharge Instructions: Apply Kerlix and Coban compression as directed. Compression Wrap: Coban Self-Adherent Wrap 4x5 (in/yd) (Generic) 1 x Per Week/7 Days Discharge Instructions: Apply over Kerlix as directed. Wound #24 - Lower Leg Wound Laterality: Right, Anterior Cleanser: Soap and Water Every Other Day/30 Days Discharge Instructions: May shower and wash wound with dial antibacterial soap and water prior to dressing change. Peri-Wound Care: Sween Lotion (Moisturizing lotion) Every Other Day/30 Days Discharge Instructions: Apply moisturizing lotion as directed Topical: Gentamicin Every Other Day/30 Days Discharge Instructions: As directed by physician Topical: Keystone antibiotic Every Other Day/30 Days Discharge Instructions: Once received, stop the  gentamicin and Apply Keystone under the calcium alginate with silver. Prim Dressing: KerraCel Ag Gelling Fiber Dressing, 4x5 in (silver alginate) (Generic) Every Other Day/30 Days ary Discharge Instructions: Apply silver alginate to wound bed as instructed Secondary Dressing: Woven Gauze Sponge, Non-Sterile 4x4 in (Generic) Every Other Day/30 Days Discharge Instructions: Apply over primary dressing as directed. Secondary Dressing: ABD Pad, 8x10 Every Other Day/30 Days Discharge Instructions: Apply over primary dressing as directed. Compression Wrap: Kerlix Roll 4.5x3.1 (in/yd) (Generic) Every Other Day/30 Days Discharge Instructions: Apply Kerlix and Coban compression as directed. Compression Wrap: Coban Self-Adherent Wrap 4x5 (in/yd) (Generic) Every Other Day/30 Days Discharge Instructions:  Apply over Kerlix as directed. Wound #25 - Foot Wound Laterality: Left, Lateral Cleanser: Soap and Water 1 x Per Week/7 Days Discharge Instructions: May shower and wash wound with dial antibacterial soap and water prior to dressing change. Peri-Wound Care: Sween Lotion (Moisturizing lotion) 1 x Per Week/7 Days Discharge Instructions: Apply moisturizing lotion as directed Prim Dressing: IODOFLEX 0.9% Cadexomer Iodine Pad 4x6 cm 1 x Per Week/7 Days ary Discharge Instructions: Apply to wound bed as instructed Secondary Dressing: Woven Gauze Sponge, Non-Sterile 4x4 in (Generic) 1 x Per Week/7 Days Discharge Instructions: Apply over primary dressing as directed. Secondary Dressing: ABD Pad, 8x10 1 x Per Week/7 Days Discharge Instructions: Apply over primary dressing as directed. Compression Wrap: Kerlix Roll 4.5x3.1 (in/yd) (Generic) 1 x Per Week/7 Days Discharge Instructions: Apply Kerlix and Coban compression as directed. Compression Wrap: Coban Self-Adherent Wrap 4x5 (in/yd) (Generic) 1 x Per Week/7 Days Discharge Instructions: Apply over Kerlix as directed. Wound #26 - Lower Leg Wound Laterality:  Right, Lateral Cleanser: Soap and Water Every Other Day/30 Days Discharge Instructions: May shower and wash wound with dial antibacterial soap and water prior to dressing change. Peri-Wound Care: Sween Lotion (Moisturizing lotion) Every Other Day/30 Days Discharge Instructions: Apply moisturizing lotion as directed Topical: Gentamicin Every Other Day/30 Days Discharge Instructions: As directed by physician Topical: Keystone antibiotic Every Other Day/30 Days Discharge Instructions: Once received, stop the gentamicin and Apply Keystone under the calcium alginate with silver. Prim Dressing: KerraCel Ag Gelling Fiber Dressing, 4x5 in (silver alginate) (Generic) Every Other Day/30 Days ary Discharge Instructions: Apply silver alginate to wound bed as instructed Secondary Dressing: Woven Gauze Sponge, Non-Sterile 4x4 in (Generic) Every Other Day/30 Days Discharge Instructions: Apply over primary dressing as directed. Secondary Dressing: ABD Pad, 8x10 Every Other Day/30 Days Discharge Instructions: Apply over primary dressing as directed. Compression Wrap: Kerlix Roll 4.5x3.1 (in/yd) (Generic) Every Other Day/30 Days Discharge Instructions: Apply Kerlix and Coban compression as directed. Compression Wrap: Coban Self-Adherent Wrap 4x5 (in/yd) (Generic) Every Other Day/30 Days Discharge Instructions: Apply over Kerlix as directed. Wound #27 - Lower Leg Wound Laterality: Right, Anterior, Distal Cleanser: Soap and Water Every Other Day/30 Days Discharge Instructions: May shower and wash wound with dial antibacterial soap and water prior to dressing change. Peri-Wound Care: Sween Lotion (Moisturizing lotion) Every Other Day/30 Days Discharge Instructions: Apply moisturizing lotion as directed Topical: Gentamicin Every Other Day/30 Days Discharge Instructions: As directed by physician Topical: Keystone antibiotic Every Other Day/30 Days Discharge Instructions: Once received, stop the gentamicin and  Apply Keystone under the calcium alginate with silver. Prim Dressing: KerraCel Ag Gelling Fiber Dressing, 4x5 in (silver alginate) (Generic) Every Other Day/30 Days ary Discharge Instructions: Apply silver alginate to wound bed as instructed Secondary Dressing: Woven Gauze Sponge, Non-Sterile 4x4 in (Generic) Every Other Day/30 Days Discharge Instructions: Apply over primary dressing as directed. Secondary Dressing: ABD Pad, 8x10 Every Other Day/30 Days Discharge Instructions: Apply over primary dressing as directed. Compression Wrap: Kerlix Roll 4.5x3.1 (in/yd) (Generic) Every Other Day/30 Days Discharge Instructions: Apply Kerlix and Coban compression as directed. Compression Wrap: Coban Self-Adherent Wrap 4x5 (in/yd) (Generic) Every Other Day/30 Days Discharge Instructions: Apply over Kerlix as directed. Wound #29 - Lower Leg Wound Laterality: Right, Lateral, Proximal Cleanser: Soap and Water Every Other Day/30 Days Discharge Instructions: May shower and wash wound with dial antibacterial soap and water prior to dressing change. Peri-Wound Care: Sween Lotion (Moisturizing lotion) Every Other Day/30 Days Discharge Instructions: Apply moisturizing lotion as directed Topical: Gentamicin Every Other  Day/30 Days Discharge Instructions: As directed by physician Topical: Keystone antibiotic Every Other Day/30 Days Discharge Instructions: Once received, stop the gentamicin and Apply Keystone under the calcium alginate with silver. Prim Dressing: KerraCel Ag Gelling Fiber Dressing, 4x5 in (silver alginate) (Generic) Every Other Day/30 Days ary Discharge Instructions: Apply silver alginate to wound bed as instructed Secondary Dressing: Woven Gauze Sponge, Non-Sterile 4x4 in (Generic) Every Other Day/30 Days Discharge Instructions: Apply over primary dressing as directed. Secondary Dressing: ABD Pad, 8x10 Every Other Day/30 Days Discharge Instructions: Apply over primary dressing as  directed. Compression Wrap: Kerlix Roll 4.5x3.1 (in/yd) (Generic) Every Other Day/30 Days Discharge Instructions: Apply Kerlix and Coban compression as directed. Compression Wrap: Coban Self-Adherent Wrap 4x5 (in/yd) (Generic) Every Other Day/30 Days Discharge Instructions: Apply over Kerlix as directed. Wound #30 - T Fourth oe Wound Laterality: Right Cleanser: Soap and Water Every Other Day/30 Days Discharge Instructions: May shower and wash wound with dial antibacterial soap and water prior to dressing change. Peri-Wound Care: Sween Lotion (Moisturizing lotion) Every Other Day/30 Days Discharge Instructions: Apply moisturizing lotion as directed Topical: Gentamicin Every Other Day/30 Days Discharge Instructions: As directed by physician Topical: Keystone antibiotic Every Other Day/30 Days Discharge Instructions: Once received, stop the gentamicin and Apply Keystone under the calcium alginate with silver. Prim Dressing: KerraCel Ag Gelling Fiber Dressing, 4x5 in (silver alginate) (Generic) Every Other Day/30 Days ary Discharge Instructions: Apply silver alginate to wound bed as instructed Secondary Dressing: Woven Gauze Sponge, Non-Sterile 4x4 in (Generic) Every Other Day/30 Days Discharge Instructions: Apply over primary dressing as directed. Secured With: Child psychotherapist, Sterile 2x75 (in/in) Every Other Day/30 Days Discharge Instructions: Secure with stretch gauze as directed. Wound #31 - Ankle Wound Laterality: Left, Medial Cleanser: Soap and Water 1 x Per Week/7 Days Discharge Instructions: May shower and wash wound with dial antibacterial soap and water prior to dressing change. Peri-Wound Care: Sween Lotion (Moisturizing lotion) 1 x Per Week/7 Days Discharge Instructions: Apply moisturizing lotion as directed Prim Dressing: Cutimed Sorbact Swab (Generic) 1 x Per Week/7 Days ary Discharge Instructions: Apply to wound bed as instructed Secondary Dressing: Woven  Gauze Sponge, Non-Sterile 4x4 in (Generic) 1 x Per Week/7 Days Discharge Instructions: Apply over primary dressing as directed. Secondary Dressing: ABD Pad, 8x10 1 x Per Week/7 Days Discharge Instructions: Apply over primary dressing as directed. Compression Wrap: Kerlix Roll 4.5x3.1 (in/yd) (Generic) 1 x Per Week/7 Days Discharge Instructions: Apply Kerlix and Coban compression as directed. Compression Wrap: Coban Self-Adherent Wrap 4x5 (in/yd) (Generic) 1 x Per Week/7 Days Discharge Instructions: Apply over Kerlix as directed. Wound #32 - T Great oe Wound Laterality: Right Cleanser: Soap and Water Every Other Day/30 Days Discharge Instructions: May shower and wash wound with dial antibacterial soap and water prior to dressing change. Peri-Wound Care: Sween Lotion (Moisturizing lotion) Every Other Day/30 Days Discharge Instructions: Apply moisturizing lotion as directed Topical: Gentamicin Every Other Day/30 Days Discharge Instructions: As directed by physician Topical: Keystone antibiotic Every Other Day/30 Days Discharge Instructions: Once received, stop the gentamicin and Apply Keystone under the calcium alginate with silver. Prim Dressing: KerraCel Ag Gelling Fiber Dressing, 4x5 in (silver alginate) (Generic) Every Other Day/30 Days ary Discharge Instructions: Apply silver alginate to wound bed as instructed Secondary Dressing: Woven Gauze Sponge, Non-Sterile 4x4 in (Generic) Every Other Day/30 Days Discharge Instructions: Apply over primary dressing as directed. Secured With: Child psychotherapist, Sterile 2x75 (in/in) Every Other Day/30 Days Discharge Instructions: Secure with stretch gauze as directed.  Wound #33 - T Second oe Wound Laterality: Right Cleanser: Soap and Water Every Other Day/30 Days Discharge Instructions: May shower and wash wound with dial antibacterial soap and water prior to dressing change. Peri-Wound Care: Sween Lotion (Moisturizing lotion) Every  Other Day/30 Days Discharge Instructions: Apply moisturizing lotion as directed Topical: Gentamicin Every Other Day/30 Days Discharge Instructions: As directed by physician Topical: Keystone antibiotic Every Other Day/30 Days Discharge Instructions: Once received, stop the gentamicin and Apply Keystone under the calcium alginate with silver. Prim Dressing: KerraCel Ag Gelling Fiber Dressing, 4x5 in (silver alginate) (Generic) Every Other Day/30 Days ary Discharge Instructions: Apply silver alginate to wound bed as instructed Secondary Dressing: Woven Gauze Sponge, Non-Sterile 4x4 in (Generic) Every Other Day/30 Days Discharge Instructions: Apply over primary dressing as directed. Secured With: Child psychotherapist, Sterile 2x75 (in/in) Every Other Day/30 Days Discharge Instructions: Secure with stretch gauze as directed. Wound #5 - Lower Leg Wound Laterality: Right, Medial Cleanser: Soap and Water Every Other Day/30 Days Discharge Instructions: May shower and wash wound with dial antibacterial soap and water prior to dressing change. Peri-Wound Care: Sween Lotion (Moisturizing lotion) Every Other Day/30 Days Discharge Instructions: Apply moisturizing lotion as directed Topical: Gentamicin Every Other Day/30 Days Discharge Instructions: As directed by physician Topical: Keystone antibiotic Every Other Day/30 Days Discharge Instructions: Once received, stop the gentamicin and Apply Keystone under the calcium alginate with silver. Prim Dressing: KerraCel Ag Gelling Fiber Dressing, 4x5 in (silver alginate) (Generic) Every Other Day/30 Days ary Discharge Instructions: Apply silver alginate to wound bed as instructed Secondary Dressing: Woven Gauze Sponge, Non-Sterile 4x4 in (Generic) Every Other Day/30 Days Discharge Instructions: Apply over primary dressing as directed. Secondary Dressing: ABD Pad, 8x10 Every Other Day/30 Days Discharge Instructions: Apply over primary dressing as  directed. Compression Wrap: Kerlix Roll 4.5x3.1 (in/yd) (Generic) Every Other Day/30 Days Discharge Instructions: Apply Kerlix and Coban compression as directed. Compression Wrap: Coban Self-Adherent Wrap 4x5 (in/yd) (Generic) Every Other Day/30 Days Discharge Instructions: Apply over Kerlix as directed. Electronic Signature(s) Signed: 01/23/2021 5:37:42 PM By: Linton Ham MD Signed: 01/23/2021 6:10:15 PM By: Baruch Gouty RN, BSN Entered By: Baruch Gouty on 01/23/2021 13:00:38 -------------------------------------------------------------------------------- Problem List Details Patient Name: Date of Service: [name], [name] 01/23/2021 10:30 A M Medical Record Number: [id number] Patient Account Number: [id number] Date of Birth/Sex: Treating RN: October 05, 1951 (70 y.o. 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 02/24/2015 No Yes I87.331 Chronic venous hypertension (idiopathic) with ulcer and inflammation of right 10/04/2016 No Yes lower extremity L89.616 Pressure-induced deep tissue damage of right heel 12/14/2019 No Yes L97.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 05/31/2020 No Yes L97.528 Non-pressure chronic ulcer of other part of left foot with other specified 12/05/2020 No Yes severity I70.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 10/31/2020 No Yes lower leg I70.248 Atherosclerosis of native arteries of left  leg with ulceration of other part of 10/31/2020 No Yes lower leg Inactive Problems ICD-10 Code Description Active Date Inactive Date 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 01/23/2021 10:30 A M Medical Record Number: [id number] Patient Account Number: [id number] Date of Birth/Sex: Treating RN: 04-13-1951 (70 y.o. [name] Primary Care Provider: [name] Other Clinician: Referring Provider: Treating Provider/Extender: [name] in Treatment: 461 Subjective History of Present Illness (HPI) this is a patient who initially came to [place] for wounds on the medial malleoli bilaterally as well as her upper medial lower extremities bilaterally. These wounds eventually healed with assistance of Apligraf's bilaterally. While this was occurring she developed the current wound which opened into a fairly substantial wound on the right lower extremity. These are mostly secondary to venous stasis physiology however the patient also has underlying scleroderma, pulmonary hypertension. The wound has been making good progress lately with the Hydrofera Blue-based dressing. 03/17/2015; patient had a arterial evaluation a year ago. Her right ABI was 0.86 left was 1.0. T brachial index was 0.41 on the right 0.45 on the left. Her oe bilateral common femoral artery waveforms were triphasic. Her white  popliteal posterior tibial artery and anterior tibial artery waveforms were monophasic with good amplitude. Luteal artery waveforms were biphasic it was felt that her bilateral great toe pressures are of normal although adequate for tissue healing. 03/24/2015. The condition of this wound is not really improved that. He was covered with as fibrinous surface slough and eschar. This underwent an aggressive debridement with both a curette and scalpel. I still cannot really get down to what I can would consider to be a viable surface. There is no evidence of infection. Previous workup for ischemia roughly a year ago was negative nevertheless I think that continues to be a concern 04/07/15. The patient arrived for application of second Apligraf. Once again the surface of this wound is certainly less viable than I would like for an advanced treatment option. An aggressive debridement was done. She developed some arteriolar bleeding which required that along pressure and silver alginate. 04/21/15: change in the condition of this wound. Once again it is covered in a gelatinous surface slough. After debridement today surface of the wound looks somewhat better but now a heeling surface 05/05/15 Apligraf #4 applied.Still a lot of slough on this wound. 05/19/15 Apligraf #5 applied. Still a lot of slough on this wound I did not aggressively remove this 06/02/15; continued copious amounts of surface slough. This debridement fairly easily. 06/08/2015 -- the last time she had a venous duplex study done was over 3 years ago and the surgery was prior to that. I have recommended that she sees Dr. early for a another opinion regarding a repeat venous duplex and possibly more endovenous ablation of vein stripping of micro-phlebotomies. 06/16/15; wound has a gelatinous surface eschar that the debridement fairly easily to a point. I don't disagree with the venous workup and perhaps even arterial re-evaluation. She is on prednisone 5  mg and continue his medications for her pulmonary artery hypertension I am not sure if the latter have any wound care healing issues I would need to investigate this. 06/23/15 continues with a gelatinous surface eschar  with of fibrinous underlying. What I can see of this wound does not look unhealthy however I just can't get this material which I think is 2 different layers off. Empiric culture done 06/30/15; unfortunately not a lot of change in this wound. A gelatinous surface eschar is easily removed however it has a tight fibrinous surface underneath the. 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 The wound comes back and basically and unchanged state the. She has a gelatinous surface that is more easily removed however there is a tightly fibrinous surface underneath the. There is no evidence of infection. She has a vascular follow-up next month. I would have to inject her in order to do a more aggressive debridement of this area 07/21/15 the wound is roughly in the same state albeit the debridement is done with greater ease. There is less of the fibrinous eschar underneath the. There is no evidence of infection. She has follow-up with vascular surgery next week. No evidence of surrounding infection. Her original distal wounds healed while this one formed. 07/28/15; wound is easier to debride. No wound erythema. She is seeing Dr. [name] next week. 08/18/15 Has been to [place] for repeat ablation. Have been using medihoney pad with some improvement 08/25/15; absolutely no change in the condition of this wound in either its overall size or surface condition in many months now. At one point I had this down to [place] healthier surface I think with [facility] however this did not actually progress towards closure. Do not believe that the wound has actually deteriorated in terms of volume at all. We have been using a medihoney pad which allows easier debridement of the gelatinous eschar  but again no overall actual improvement. the patient is going towards an ablation with pain and vascular which I think is scheduled for next week. The only other investigation that I could first see would be a biopsy. She does have underlying scleroderma 09/02/15 eschar is much easier to debridement however the base of this does not look particularly vibrant. We changed to Iodoflex. The patient had her ablation earlier this week 09/09/15 again the debridement over the base of this wound is easier and the base of the wound looks considerably better. We will continue the Iodoflex. Dr. Tawni Millers has expressed his satisfaction with the result of her ablation 09/15/15 once again the wound is relatively free of surface eschar. No debridement was done today. It has a pale-looking base to it. although this is not as deep as it once was it seems to be expanding especially inferiorly. She has had recent venous ablation but this is no closer to healing.I've gone ahead and done a punch biopsy this from the inferior part of the wound close to normal skin 09/22/15: the wound is relatively free of surface eschar. There is some surrounding eschar. I'm not exactly sure at what level the surface is that I am seeing. 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 No evidence of atypical infection, malignancy. 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 No debridement was done 09/29/15; the wound surface is relatively free of surface eschar. There is eschar surrounding the walls of the wound. Aside from the improvement in the amount of surface slough. This wound has not progressed any towards closure. There is not even a surface that looks like there at this is ready. There is no evidence of any infection nor maligancy based on biopsy I did on 9/15.  I continued with the Iodoflex however I am looking towards some alternative to try to promote some closure or filling in of this  surface. Consider triple layer [place]. Collagen did not result in adequate control of the surface slough 10/13/15; the patient was in hospital last week with severe anemia. The wound looks somewhat better after debridement although there is widening medially. There is no evidence of infection. 10/20/15; patient's wound on the right lateral lower leg is essentially unchanged. This underwent a light surface debridement and in general the debridement is easier and the surface looks improved. I noted in doing this on the side of the wound what appears to be a piece of calcium deposition. The patient noted that she had previous things on the tips of her fingers. 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 She follows with rheumatology and I have asked them to talk to her about this. In view of that S the nonhealing ulcer may have something to do with calcinosis and also unrelieved Raynaud's disease in this area. I should note that her original wounds on the right and left medial malleolus and the inner aspects of both legs just below the knees did however heal over 10/30/15; the patient's wound on the right lateral lower leg is essentially unchanged. I was able to remove some calcified material from the medial wound edge. 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 Otherwise the wound appears essentially unchanged there is less adherent surface eschar. Some of the calcified material was sent to pathology for analysis 11/17/15. The patient's wound on the right lateral leg is essentially unchanged. Wider Medially. 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  There is some surface slough but I did not debridement this. No evidence of surrounding infection. 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 This will make healing difficult 12/15/15; the wound has less depth surface has a fibrinous slough and calcifications in the wound edge no evidence of infection 12/22/15; the wound definitely has less Fibrinous slough on the base. Calcifications around the wound edges are still evident. Although the wound bed looks healthier it is still pale in appearance. Previous biopsy did not show malignancy 01/04/15; surgical debridement of nonviable slough and subcutaneous tissue the wound cleans up quite nicely but appears to be expanding outward calcifications around the wound edges are still there. 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 She is to see her rheumatologist I'll see if he has any opinions on this. My punch biopsy done in September did not show calcifications although these are clearly evident. 01/19/16 light selective debridement of nonviable surface slough. There is epithelialization medially. This gives me reason for cautious optimism. She has been to see her rheumatologist, there is nothing that can be done for this type of soft tissue calcification associated with scleroderma 02/02/16 no debridement although there is a light surface slough. She has 2 peninsulas of skin 1 inferiorly and one medially. We continue to make a slight and slow but definite progressive here 02/16/16; light surface debridement with more attention to the circumference of the wound bed where the fibrinous eschar is more prevalent.  No calcifications detected. She seems to have done nicely with the [facility] with some epithelialization and some improvement in the overall wound volume. 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 Continued attention to the circumference of the wound where the fibrinous eschar in calcinosis or prevalent. No calcifications were detected. She has continued improvement with Hydrofera Blue. The wound is no longer as deep 03/15/16 surgical debridement done to remove surface escha Especially around the circumference of the wound where there is nonviable subcutaneous tissue. In spite of this there is considerable improvement in the overall dimensions and depth of the wound. Islands of epithelialization are seen especially medially inferiorly and superiorly to a lesser extent. She is using Hydrofera Blue at home 03/29/16; surgical debridement done to remove surface eschar and nonviable subcutaneous tissue. This cleans up quite nicely mention slightly larger in terms of length and width however depth is less 04/12/16; continued gradual improvement in terms of depth and the condition of the wound base. Debridement is done. Continuing long standing Hydrofera Blue at home with Kerlix Coban wraps 04/26/16; continued gradual improvement in terms of depth and management as well as condition of the wound base. Surgical debridement done she continues with Hydrofera Blue. 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 She initially came to this clinic venous insufficiency ulcers which have long since healed 05/17/16 continued improvement in terms of the depth and measurements of this wound although she has a tightly adherent fibrinous slough each time. We've been continued with long standing Hydrofera Blue which seems to done as well for this wound is many advanced treatment options. 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 She also has chronic venous insufficiency. She has an irritation on her lateral right ankle secondary to our wraps 05/10/16; wound appears to be smaller  especially on the medial aspect and especially in the width. Wound was debridement surface looks better. She is also been in the hospital apparently with anemia again she tells me she had an endoscopy. Since she got home after 3 days which I believe was sometime last week she has had an irritated painful area on the right lateral ankle surrounding the lateral malleolus 05/31/16; much more adherent surface slough today then recently although I don't think the dimensions of changed that much. A more aggressive debridement is required. The irritated area over her medial malleolus is more pruritic and painful and I don't think represents cellulitis 06/14/16; no major change in her wound dimensions however there is more tightly adherent surface slough which is disappointing. As well as she appears to have a new small area medially. Furthermore an irritated uncomfortable area on the lateral aspect of her right foot just below the lateral malleolus. 06/21/16; I'm seeing the patient back in follow-up for the new areas under her major wound on the right anterior leg. She has been using Hydrofera Blue to this area probably for several months now and although the dressings seem to be helping for quite a period of time I think things have stagnated lately. She comes in today with a relatively tight adherent surface slough and really no changes in the wound shape or dimension. The 2 small areas she had inferiorly are tiny but still open they seem improved this well. There is no uncontrolled edema and I don't think there is any  evidence of cellulitis. 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 Patient has had vascular evaluations both venous and arterial. I have biopsied this area. There is no obvious infection. The worrisome thing today is that she seems to be developing areas of erythema and epithelial damage on  the medial aspect of the right foot. Also to a lesser degree inferior to the actual wound itself. Again I see no obvious changes to suggest cellulitis however as this is the only treatable option I will probably give her antibiotics. 07/13/16 no major change in the lady's large anterior right leg wound. Still covered with a very tightly adherent surface slough which is difficult to debridement. There is less erythema around this, culture last week grew pseudomonas I gave her ciprofloxacin. The area on her lateral right malleolus looks better- 08/02/16 the patient's wounds continued to decline. Her original large anterior right leg wound looks deeper. Still adherent surface slough that is difficult to debridement. She has a small area just below this and a punched-out wound just below her lateral malleolus. In the meantime she is been in hospital with apparently an upper GI bleed on Plavix and aspirin. She is now just on Plavix she received 3 units of packed cells 08/23/16; since I last saw this 3 weeks ago, the open large area on her right leg looks about the same syrup. She has a small satellite lesion just underneath this. The area on her medial right ankle is now a deep necrotic wound. I attempted to debridement this however there is just too much pain. 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 She follows with vascular surgery and is scheduled for an angiogram in early September 09/06/16; the patient is going for an arteriogram tomorrow. Her original large wound on the right calf is about the same the satellite lesion underneath it is about the same however the area on her medial ankle is now deeper with exposed tendon. I am no longer attempting to debride these wounds 09/20/16; the patient has undergone a right femoral endarterectomy and Dacron patch angioplasty. This seems to have helped the flow in her right leg. 10/04/16; Arrived  today for aggressive debridement of the wounds on her right calf the original wound the one beneath it and a difficult area over her right lateral leg just above the lateral malleolus. 10/25/16; her 3 open wounds are about the same in terms of dimensions however the surface appears a lot healthier post debridement. Using Iodoflex 10/18/16 we have been using Iodoflex to her wounds which she tolerated with some difficulty. 10/11/16; has been using Santyl for a period of time with some improvement although again very adherent surface slough would prevent any attempted healing this. She has a original wound on the left calf, the satellite underneath that and the most recent wound on the right medial ankle. She has completed revascularization by Dr. early and has had venous ablation earlier. Want to go back to Iodoflex to see if week and get a healthier surface to this wound bed failing this I think she'll need to be taken to the OR and I am prepared to call Dr. Marla Roe to discuss this. She is obviously not a good candidate for general anesthesia however.; 11/08/16; I put her on Iodoflex last time to see if I can get the  wound bed any healthier and unfortunately today still had tremendous surface slough. 11/15/16; 4 weeks' worth of Iodoflex with not much improvement. Debridement on the major wounds on her left anterior leg is easier however this does not maintain from week to week. The punched-out area on her medial right ankle 11/29/16; I attempted to change the patient last visit to [facility] however she states this burned and was very uncomfortable therefore we gave her permission to go back to the eye out of complex which she already had at home. Also she noted a lot of pain and swelling on the lateral aspect of her leg before she traveled to [facility] for the holidays, I called her in doxycycline over the phone. This seems to have helped 12/06/16; Wounds unchanged by in large.  Using Iodoflex 12/13/16; her wounds today actually looks somewhat better. The area on the right lateral lower leg has reasonably healthy-looking granulation and perhaps as actually filled and a bit. Debridement of the 2 wounds on the medial calf is easier and post debridement appears to have a healthier base. We have referred her to Dr. Migdalia Dk for consideration of operative debridement 12/20/16; we have a quick note from Dr. Merri Ray who feels that the patient needs to be referred to an academic center/plastic surgeon. This is due to the complexity of the patient's medical issues as it applies to anything in the OR. We have been using Iodoflex 12/27/16; in general the wounds on the right leg are better in terms of the difficult to remove surface slough. She has been using Iodoflex. She is approved for Apligraf which I anticipated ordering in the next week or 2 when we get a better-looking surface 01/04/16 the deep wounds on the right leg generally look better. Both of them are debrided further surface slough. The area on the lateral right leg was not debrided. She is approved for Apligraf I think I'll probably order this either next week or the week after depending on the surface of the wounds superiorly. We have been using Iodoflex which will continue until then 01/11/16; the deep wounds on the right leg again have a surface slough that requires debridement. I've not been able to get the wound bed on either one of these wounds down to what would be acceptable for an advanced skin stab-like Apligraf. The area on the medial leg has been improving. We have been using hot Iodoflex to all wounds which seems to do the best at at least limiting the nonviable surface 01/24/17; we have continued Iodoflex and all her wound areas. Her debridement Gen. he is easier and the surface underneath this looks viable. Nevertheless these are large area wounds with exposed muscle at least on the anterior parts. We  have ordered Apligraf's for 2 weeks from now. The patient will be away next week 02/07/17; the patient was close to have first Apligraf today however we did not order one. I therefore replaced her Iodoflex. She essentially has 3 large punched- out areas on her right anterior leg and right medial ankle. 02/11/17; Apligraf #1 02/25/17; Apligraf #2. In general some improvement in the right medial ankle and right anterior leg wounds. The larger wound medially perhaps some better 03/11/17; Apligraf #3. In general continued improvement in the right medial ankle and the right anterior leg wounds. 03/25/17 Apligraf #4. In general continued improvement especially on the right medial ankle and the lower 04/08/17; Apligraf #5 in general continued improvement in all wound sites. 04/22/17; post Apligraf #  5 her wound beds continued to look a lot better all of them up to the surface of the surrounding skin. Had a caramel-colored slough that I did not debrided in case there is residual Apligraf effect. The wounds are as good as I have seen these looking quite some time. 04/29/17; we applied [place] and last week after completing her Apligraf. Wounds look as though they've contracted somewhat although they have a nonviable surface which was problematic in the past. Apparently she has been approved for further Apligraf's. We applied Iodosorb today after debridement. 05/06/17; we're fortunate enough today to be able to apply additional Apligraf approved by her insurance. In general all of her wounds look better Apligraf #6 05/24/17; Apligraf #7 continued improvement in all wounds o3 06/10/17 Apligraf #8. Continued improvement in the surface of all wounds. Not much of an improvement in dimensions as I might a follow 06/24/17 Apligraf #9 continued general improvement although not as much change in the wound areas I might of like. She has a new open area on the right anterior lateral ankle very small and superficial. She also  has a necrotic wound on the tip of her right index finger probably secondary to severe uncontrolled Raynaud's phenomenon. She is already on sildenafil and already seen her rheumatologist who gave her Keflex. 07/08/17; Apligraf #10. Generalized improvement although she has a small additional wound just medial to the major wound area. 07/22/17; after discussion we decided not to reorder any further Apligraf's although there is been considerable improvement with these it hasn't been recently. The major wound anteriorly looks better. Smaller wounds beneath this and the more recent one and laterally look about the same. The area on the right lateral lower leg looks about the same 07/29/17 this is a patient who is exceedingly complex. She has advanced scleroderma, crest syndrome including calcinosis, PAD status post revascularization, chronic venous insufficiency status post ablations. She initially presented to this clinic with wounds on her bilateral lower legs however these closed. More recently we have been dealing with a large open area superiorly on the right anterior leg, a smaller wound underneath this and then another one on the right medial lower extremity. These improve significantly with 10 Apligraf applications. Over the last 2-3 weeks we are making good progress with Hydrofera Blue and these seem to be making progress towards closure 08/19/17; wounds continued to make good improvement with Hydrofera Blue and episodic aggressive debridements 08/26/17; still using Hydrofera Blue. Good improvements 09/09/17; using Hydrofera Blue continued improvement. Area on the lateral part of her right leg has only a small remaining open area. The small inferior satellite region is for all intents and purposes closed. Her major wound also is come in in terms of depth and has advancing epithelialization. 09/16/17; using Hydrofera Blue with continued improvement. The smaller satellite wound. We've closed out today along  with a new were satellite wound medially. The area on her medial ankle is still open and her major wound is still open but making improvement. All using Hydrofera Blue. Currently 09/30/17; using Hydrofera Blue. Still a small open area on the lateral right ankle area and her original major wound seems to be making gradual and steady improvement. 10/14/17; still using Hydrofera Blue. Still too small open areas on the right lateral ankle. Her original major wound is horizontal and linear. The most problematic area paradoxically seems to be the area to the medial area wears I thought it would be the lateral. The patient is going for  amputation of her gangrenous fingertip on the right fourth finger. 10/28/17; still using Hydrofera Blue. Right lateral ankle has a very small open area superiorly on the most lateral part of the wound. Her original open wound has 2 open areas now separated by normal skin and we've redefined this. 11/11/17; still using Hydrofera Blue area and right lateral ankle continues to have a small open area on mostly the lateral part of the wound. Her original wound has 2 small open areas now separated by a considerable amount of normal skin 11/28/17; the patient called in slightly before Thanksgiving to report pain and erythema above the wound on the right leg. In the past this is responded well to treatment for cellulitis and I gave her over the phone doxycycline. She stated this resulted in fairly abrupt improvement. We have been using Hydrofera Blue for a prolonged period of time to the larger wound anteriorly into the remaining wound on her right right lateral ankle. The latter is just about closed with only a small linear area and the bottom of the [place]. 12/02/17; use endoform and left the dressing on since last visit. There is no tenderness and no evidence of infection. 12/16/17; patient has been using Endoform but not making much progress. The 2 punched out open areas anteriorly  which were the reminiscence of her major wound appear deeper. The area on the lateral aspect of her right calf also appears deeper. Also she has a puzzling tender swelling above her wound on the right leg. This seems larger than last time. Just above her wounds there appears to be some fluctuance in this area it is not erythematous and there is no crepitus 12/30/17; patient has been using Endoform up until last week we used Hydrofera Blue. Ultrasound of the swelling above her 2 major wounds last time was negative for a fluid collection. I gave her cefaclor for the erythema and tenderness in this area which seems better. Unfortunately both punched out areas anteriorly and the area on her right medial lower leg appear deeper. In fact the lateral of the wounds anteriorly actually looks as though it has exposed tendon and/or muscle sheath. She is not systemically unwell. She is complaining of vaginitis type symptoms presumably Candida from her antibiotics. 01/06/18; we're using santyl. she has 2 punched out areas anteriorly which were initially part of a large wound. Unfortunately medially this is now open to tendon/muscle. All the wounds have the same adherent very difficult to debride surface. 01/20/18; 2 week follow-up using Santyl. She has the 2 punched out areas anteriorly which were initially part of her large surface wound there. Medially this still has exposed muscle. All of these have the same tightly adherent necrotic surface which requires debridement. PuraPly was not accepted by the patient's insurance however her insurance I think it changed therefore we are going to run Apligraf to gain 02/03/18; the patient has been using Santyl. The wound on the right lateral ankle looks improved and the 2 areas anteriorly on the right leg looks about the same. The medial one has exposed muscle. The lateral 1 requiresdebridement. We use PuraPly today for the first week 02/10/18; PuraPly #2. The patient has 3  wounds. The area on the right lateral ankle, 2 areas anteriorly that were part of her original large wound in this area the medial one has exposed muscle. All of the wounds were lightly debrided with a number 3 curet. PuraPly #2 applied the lateral wound on the calf and the right  lateral ankle look better. 02/17/18; PuraPly #3. Patient has 3 wounds. The area on the right lateral ankle in 2 areas internally that were part of her original large wound. The lateral area has exposed muscle. She arrived with some complaints of pain around the right ankle. 02/24/18; PuraPly #4; not much change in any of the 3 wound areas. Right lateral ankle, right lateral calf. Both of these required debridement with a #3 curet. She tolerates this marginally. The area on the medial leg still has exposed muscle. Not much change in dimensions 03/03/18 PuraPly #5. The area on the medial ankle actually looks better however the 2 separate areas that were original parts of the larger right anterior leg wound look as though they're attempting to coalesce. 03/10/18; PuraPly #6. The area on the medial ankle actually continues to look and measures smaller however the 2 separate areas that were part of the original large wound on the right anterior leg have now coalesced. There hasn't been much improvement here. The lateral area actually has underlying exposed muscle 03/17/18-she is here in follow-up evaluation for ulcerations to the right lower extremity. She is voicing no complaints or concerns. She tolerated debridement. Puraply#7 placed 03/24/18; difficult right lower extremity ulcerations. PuraPly #8 place. She is been approved for [employer]. She did very well with Apligraf today however she is apparently reached her "lifetime max" 03/31/18; marginal improvement with PuraPly although her wounds looked as good as they have in several weeks today. Used TheraSkins #1 04/14/18 TheraSkin #2 today 04/28/18 TheraSkins #3. Wound slightly  improved 05/12/18; TheraSkin skin #4. Wound response has been variable. 05/27/18 TheraSkin #5. Generally improvement in all wound areas. I've also put her in 3 layer compression to help with the severe venous hypertension 06/09/18; patient is done quite well with the TheraSkins unfortunately we have no further applications. I also put her in 3 layer compression last week and that really seems to of helped. 06/16/18; we have been using silver collagen. Wounds are smaller. Still be open area to the muscle layer of her calf however even that is contracted somewhat. She tells me that at night sometimes she has pain on the right lateral calf at the site of her lower wound. Notable that I put her into 3 layer compression about 3 weeks ago. She states that she dangles her leg over the bed that makes it feel better but she does not describe claudication during the day ooShe is going to call her secondary insurance to see if they will continue to cover advanced treatment products I have reviewed her arterial studies from 01/22/17; this showed an ABI in the right of 1 and on the left noncompressible. TBI on the right at 0.30 on the left at 0.34. It is therefore possible she has significant PAD with medial calcification falsely elevating her ABI into the normal range. I'll need to be careful about asking her about this next week it's possible the 3 layer compression is too much 06/23/18; was able to reapply TheraSkin 1 today. Edema control is good and she is not complaining of pain no claudication 07/07/18;no major change. New wound which was apparently a taper removal injury today in our clinic between her 2 wounds on the right calf TheraSkins #2 07/14/18; I think there is some improvement in the right lateral ankle and the medial part of her wound. There is still exposed muscle medially. 07/28/18; two-week follow-up. TheraSkins#3. Unfortunately no major change. 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 She seems to be doing well and in the base of the wound appears to show some progress at this point. This is the last approved Theraskin of the second set. 08/25/18; she has completed TheraSkin. There has been some improvement on the right lateral calf wound as well as the anterior leg wounds. The open area to muscle medially on the anterior leg wound is smaller. I'm going to transition her back to [facility] under Kerlix Coban change every second day. She reports that she had some calcification removed from her right upper arm. We have had previous problems with calcifications in her wounds on her legs but that has not happened recently 09/08/18;using Hydrofera Blue on both her wound areas. Wounds seem to of contracted nicely. She uses Kerlix Coban wrap and changes at home herself 09/22/2018; using Hydrofera Blue on both her wound areas. Dimensions seem to have come down somewhat. There is certainly less depth in the medial part of the mid tibia wound and I do not think there is any exposed muscle at this point. 10/06/2018; 2-week follow-up. Using [facility] on her wound areas. Dimensions have come down nicely both on the right lateral ankle area in the right mid tibial area. She has no new complaints 10/20/2018; 2-week follow-up. She is using Hydrofera Blue. Not much change from the last time she was here. The area on the lateral ankle has less depth although it has raised edges on one side. I attempted to remove as much of the raised edge as I could without creating more additional wounds. The area on the right anterior mid tibia area looks the same. 11/03/2018; 2-week follow-up she is using Hydrofera Blue. On the right anterior leg she now has 2 wounds separated by a large area of normal skin. The area on the medial  part still has I think exposed muscle although this area itself is a lot smaller. The area laterally has some depth. Both areas with necrotic debris. ooThe area on her right lateral ankle has come in nicely 11/17/2018; patient continues to use Hydrofera Blue. We have been increasing separation of the 2 wounds anteriorly which were at one point joint the area on the right lateral calf continues to have I think some improvement in depth. 12/08/2018; patient continues to use Hydrofera Blue. There is some improvement in the area on the right lateral calf. The 2 areas that were initially part of the original large wound in the mid right tibia are probably about the same. In fact the medial area is probably somewhat larger. We will run puraply through the patient's insurance 12/22/2018; she has been using Hydrofera Blue. We have small wounds on the right lateral calf and 2 small areas that were initially part of a large wound in the right mid tibia. We applied pure apply #1 today. 12/29/2018; we applied puraply #2. Her wounds look somewhat better especially on the right lateral calf and the lateral part of her original wound in the mid tibial area. 01/05/2019; perhaps slightly improved in terms of wound bed condition but certainly not as much improvement as I might of liked. Puraply #3 1/13: we did not have a correct sized puraply to apply. wounds more pinched out looking, I increased her compression to 3 layer last week to help with significant multilevel venous hypertension. Since then  I've reviewed her arterial status. She has a right femoral endarterectomy and a distal left SFA stent. She was being followed by Dr. [name] for a period however she does not appear to have seen him in 3 years. I will set up an appointment. 1/20. The patient has an additional wound on the right lateral calf between the distal wound and proximal wounds. We did not have Puraply last week. Still does not have a follow-up with Dr.  early 1/27: Follow-up with Dr. early has been arranged apparently with follow-up noninvasive studies. Wounds are measuring roughly the same although they certainly look smaller 2/3; the patient had non invasive studies. Her ABIs on the right were 0.83 and on the left 1.02 however there was no great toe pressure bilaterally. Also worrisome monophasic waveforms at the PTA and dorsalis pedis. We are still using Puraply. We have had some improvement in all of the wounds especially the lateral part of the mid tibial area. 2/10; sees Dr. early of vein and vascular re-arterial studies next week. Puraply reapplied today. 2/17; Dr. early of vein and vascular his appointment is tomorrow. Puraply reapplied after debridement of all wounds 2/24; the patient saw Dr. early I reviewed his note. sHe noted the previous right femoral endarterectomy with a Dacron patch. He also noted the normal ABI and the monophasic waveforms suggesting tibial disease. Overall he did not feel that she had any evidence of arterial insufficiency that would impair her wound healing. He did note her venous disease as well. He suggested PRN follow-up. 3/2; I had the last puraply applied today. The original wounds over the mid tibia area are improved where is the area on the right lower leg is not 3/9; wounds are smaller especially in the right mid tibia perhaps slightly in the right lateral calf. We finished with puraply and went to endoform today 3/23; the patient arrives after 2 weeks. She has been using endoform. I think all of her wounds look slightly better which includes the area on the right lateral calf just above the right lateral malleolus and the 2 in the right mid tibia which were initially part of the same wound. 4/27; TELEHEALTH visit; the patient was seen for telehealth visit today with her consent in the middle of the worldwide epidemic. Since she was last here she called in for antibiotics with pain and tenderness around  the area on the right medial ankle. I gave her empiric doxycycline. She states this feels better. She is using endoform on both of these areas 5/11 TELEHEALTH; the patient was seen for telehealth visit today. She was accompanied at home by her husband. She has severe pulmonary hypertension accompanied scleroderma and in the face of the Covid epidemic cannot be safely brought into our clinic. We have been using endoform on her wound areas. There are essentially 3 wound areas now in the left mid tibia now 2 open areas that it one point were connected and one on the right lateral ankle just above the malleolus. 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 She cannot come to clinic. The wounds were reviewed today via telehealth. She has severe chronic venous hypertension which I think is centrally mediated. She has wounds on her right anterior tibia and right lateral ankle area. These are chronic. She has been using endoform. 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 We have been following her from telehealth. She has severe chronic venous hypertension which may be mostly centrally mediated secondary to right heart heart failure. She has wounds on her right anterior tibia and right lateral ankle these are chronic we have been using endoform 6/22; TELEHEALTH; this patient was seen today via telehealth. 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 Since the last time we had contact with this patient she developed some pain and erythema around the wound on her right lateral malleolus/ankle and we put in antibiotics for  her. This is resulted in good improvement with resolution of the erythema and tenderness. I changed her to silver alginate last time. We had been using endoform for an extended period of time 7/13; TELEHEALTH; this patient was seen today via telehealth. 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 She would continue to be at a prohibitive risk to be brought into our clinic unless this was absolutely necessary. These visits have been done with her approval as well as her husband. We have been using silver alginate to the areas on the right mid tibia and right lateral lower leg. 7/27 TELEHEALTH; patient was seen along with her husband today via telehealth. 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 We changed her to sample last visit. She has 2 areas a chronic wound on her right mid tibia and one just above her ankle. These were not the original wounds when she came into this clinic but she developed them during treatment 8/17; she comes in for her first face-to-face visit in a long period. She has a remaining area just medial to the right tibia which is the last open part of her large wound across this area. She also has an area on the right lateral lower leg. We prescribed Santyl last telehealth visit but they were concerned that this was making a deeper so they put silver alginate on it last week. Her husband changes the dressings. 8/31; using Santyl to the 2 wound areas some improvement in wound surfaces. Husband has surgery in 2 weeks we will put her out 3 weeks. Any of the advanced treatment options that I can think of that would be eligible for this wound would also cause her to have to come in weekly. The risk that the patient is just too high 9/21. Using Santyl to the 2 wound areas. Both of these are somewhat better although the medial mid tibia area still has exposed  muscle. Lateral ankle requiring debridement. Using Santyl 10/12; using Santyl to the 2 wound areas. One on the right lateral ankle and the other in the medial calf which still has exposed muscle. Both areas have come down in size and have better looking surfaces. She has made nice progress with santyl 11/2; we have been using Santyl to the 2 wound areas. Right lateral ankle and the other in the mid tibia area the medial part of this still has exposed muscle. 11/23/2019 on evaluation today patient appears to be doing about the same really with regard to her wounds. She is actually not very pleased with how things seem to be progressing at this point she tells me that  she really has not noted much improvement unfortunately. With that being said there is no signs of active infection at this time. There is some slough buildup noted at this time which again along with some dry skin around the edges of the wound I think would benefit her to try to debride some of this away. Fortunately her pain is doing fairly well. She still has exposed muscle in the right medial/tibial area. 12/14; TELEHEALTH; she was changed to [facility] to the right calf wound and right lateral ankle wound when she was here last time. Unfortunately since then she had a fall with a pelvic fracture and a fracture of her wrist. She was apparently hospitalized for 5 days. I have not looked at her discharge summary. She apparently came out of the hospital with a blister on her right heel. She was seen today via telehealth by myself and our case manager. The patient and her husband were present. She has been using Hydrofera Blue at our direction from the last time she was in the clinic. There is been no major improvement in fact the areas appear deeper and with a less viable surface 01/04/2020; TELEHEALTH; the patient was seen today in accompaniment of her husband and our nurse. She has 3 open areas 1 on the right medial mid tibia, one on  the right lateral ankle and a large eschared pressure area on the right heel. We have been using Iodoflex to the 2 original wounds. 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 It is simply too perilous for her to be seen in any other way 1/26; TELEHEALTH; the patient was seen today in accompaniment of her husband and our RN one of our nurses. She still has the 3 open areas 1 on the right medial mid tibia which is the remanent of a more extensive wound in this area, one on the right lateral ankle and a large eschared pressure area on the right heel. We have been using Iodoflex to the 2 original wounds and a bed at 9 application to the eschared area on the heel 2/15; the first time we have seen this patient and then several months out of concern for the pandemic. She had a large horizontal wound in the mid tibia. Only the medial aspect of this is still open. Area on the lateral ankle is just about closed. She had a new pressure ulcer on the right heel which I have removed some of the eschar. We have been using Iodoflex which I will continue. The area in the mid tibia has a round circle in the middle of exposed muscle. I think we would have to use an advanced treatment product to stimulate granulation over this area. We will run this through her insurance. She is not eligible for plastic surgery for many reasons 3/1; TELEHEALTH. The patient was seen today by telehealth. 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 We have been using Iodoflex to the wound areas which include the right anterior mid tibia, right lateral ankle and the right heel. All of these were reviewed 3/8; the patient's wound just above the right ankle is closed. She still has a contracting black eschar on the heel where she had a pressure ulcer. The medial part of her original wound on the mid tibia has exposed  muscle. We have made really made no progress in this area although we have managed to  get a lot of the original wound in this area to close. We used Apligraf #1 today 3/22; the patient's ankle wound remains closed. She still has a contracting black eschar on the heel although it seems to have less surface area. It still not clear whether there is any depth here. We used Apligraf #2 today in an attempt to get granulation over the exposed muscle and what is left of her mid tibia area. 4/5; everything is closed except the medial aspect of the mid tibia wound, as well as the pressure injury on the right heel. She has been using Betadine to the right heel which has been gradually contracting. We used Apligraf #3 today. 4/19; Apligraf #4. Still a pressure area on the right heel she has been using Betadine superficial excoriated areas she has been applying salicylic acid to on the anterior leg and the thick horn on the leg just above her wound area 5/3; Apligraf #5. Unfortunately she came in today with a reopening of the area on the right lateral lower leg. Not much change in the wound we have been treating in the mid calf. Quite a bit of edema surrounding this wound. She reports that she was up on her feet quite a bit. The area on the right heel is separating she has been using Betadine She comes in with a new wound on the left lateral malleolus which is very disappointing this is been open since last week 5/17; we applied her last Apligraf last time. Unfortunately that did not have any effect on the deep area medially in the mid tibial area. However the area over the right lateral malleolus is a lot better. Right heel also is contracted we used Iodoflex last time. The new wound on the left lateral malleolus were using Santyl to. This required debridement. 6/1. Not much change in the area on the right medial mid tibia. Right lateral ankle is improved she has the necrotic area on the right heel which was a  pressure area. New area on the left lateral malleolus from last time. I changed her to Iodoflex to the area on the left lateral malleolus she said this hurt I have put her back on Santyl 6/15; right mid tibia is unchanged. I do not think there is anything I can do to this topically that we will get this to granulate over the muscle. And I am furthermore I am not sure that she is a surgical candidate either because of her severe pulmonary issues or because she really does not want to go through it. ooThe area on the right lateral malleolus is a small punched out area. ooThe area on the left lateral malleolus again small punched out with nonviable surface ooPressure ulcer on the right heel at separating black eschar. I went ahead and remove this today Lura Em has an area on the left anterior mid tibia just laterally. Geographic wounds debris on the surface. Some erythema here. 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 There is skin changes widely Change the primary dressing to Sorbact to all wound areas. She needs compression on the left leg as well as the right 6/21; right mid tibia which was her one remaining wound at 1 point is unchanged ooThe area on the right lateral malleolus actually is close to closing over still a small punched out area ooThe area on the left lateral malleolus again a deep wound it is this 1 that she feels pain in. ooPressure ulcer on the right  heel was cultured today. ooNew area on the left anterior mid tibia several geographic wounds last week in the setting of chronic venous insufficiency this actually looks some better 7/6; ooRight mid tibia about the same. Exposed muscle ooLeft lateral malleolus again necrotic debris over the surface. ooPressure ulcer on the right heel. She finished the Keflex we prescribed. Necrotic debris debrided with a #3 curette ooThe rest of her wounds on the right mid tibia right lateral ankle are better Her  husband reminds me that she had a right femoral endarterectomy and has 2 stents in her left thigh placed in 2011 approximately by vascular surgery in [place]. She saw Dr. [name] last in February 2020. At that point he did not think that the arterial insufficiency she had on the right was sufficient to explain any of her right lower extremity wounds however she now has an area on the left lateral malleolus that looks like an ischemic wound 7/12; we have been using Sorbact all wound areas ooRight mid tibia about the same exposed muscle ooLeft lateral malleolus small wound with debris in the surface punched out ooPressure ulcer of the right heel requiring debridement of necrotic debris ooLateral ankle wound on the right is just about closed ooRight mid tibia wound about the same still with exposed muscle. 7/26 really no change in any wounds. We have been using Sorbact. She has bilateral lower extremity wounds. This is in the setting of scleroderma, severe venous hypertension. She also has known PAD and I had really hoped to be able to get a review by Dr. [name] but apparently that is not booked until August 31. I have asked her to call back to vein and vascular and get an appointment with somebody a little earlier if that is possible. 8/16; patient's area on the right ankle which was a chronic wound is closed over again. The area on the right mid lower leg, right heel left lateral ankle are all about the same. Pressure ulcer on the right heel is still not viable. New areas identified on the right lateral calf. She has a follow-up with Dr. [name] on 8/31. Phenomenally I would like him to go over her arterial status with regards to her underlying stents. 9/13; patient had her ARTERIAL studies done however apparently Dr. [name] is now in [place] so she did not get an appointment with him on 8/31 and was not referred to any other provider. On the right side her PTA was monophasic. Noncompressible. TBI  on the right at 0.41 on the left she was monophasic and noncompressible they did not do a TBI because she had a Band-Aid on her big toe. Patient does not describe claudication although she is having a lot of pain in the left lateral malleolus. 10/11; the patient had a right-sided interventions on 10/5. She had a angioplasty of the right anterior tibial artery as well as the tibioperoneal trunk/peroneal artery she had a drug-coated balloon angioplasty of the right superficial femoral artery. On the left she did not have any interventions yet but is going to have another look at this in 2 weeks. The superficial femoral artery on the left and the associated stent are patent popliteal artery is patent to the dominant vessel runoff is the anterior tibial which has several high-grade stenosis in the proximal one third. We have been using Sorbact. She also has a new area on the right upper mid tibia. 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 She tolerated this well. She is using Iodoflex and her husband is doing a kerlix Coban on her. She arrives in clinic today with the original medial part of her mid tibia wound with muscle exposure small area on the right ankle which was part of her original wound she has 2 punched-out areas laterally and 1 above her area anteriorly. Nonviable surfaces similarly the right heel is nonviable. On the left side she has an area on the left foot and the left lateral ankle similar nonviable surfaces. 11/8; patient arrives in clinic today with nothing improved. She has a multiple lightly group of wounds on the right lower leg presumably different etiologies including a skin biopsy, the original wound she had medially and some  punched-out areas that are not of clear defined etiology. She says she was in a lot of pain this week could not have her leg up on the bed did not get completely relief from dropping it over the bed. She also has a small area on the left lateral malleolus and the left lateral foot necrotic surfaces on all of these wounds. We have been using Iodoflex I attempted to put her in 3 layer compression last week that did not go well. We will be backing off on kerlix Coban this week. 11/15; the ultrasound that I sent the patient for last week showed findings consistent with acute deep vein thrombosis involving the right distal femoral vein and right popliteal vein. I was concerned about the combination of Plavix and Xarelto however apparently Dr. [name] felt that was not a problem. She is now going through the acute dosing of Xarelto. She has an appointment with Dr. Trula Slade on 12/5. 11/29; 10 days ago before he left for weeks vacation she called to report pain in the right leg I sent her in some doxycycline her husband thought things were getting better however she required admission to hospital from 11/22 through 11/24 prompted by finding that her hemoglobin was 5.7 she was transfused 2 units of packed cells and her hemoglobin stabilized. She did undergo an endoscopy with findings of significant esophagitis part of which was candidal and part of it reflux. She was discharged on nystatin swish and swallow which she is doing. She seems to be better from that regard. There was no active other active source of bleeding Unfortunately the combination of Xarelto and Plavix was felt to be too much for her. The Xarelto was stopped and an IVC filter was placed she is continued on Plavix. Her big complaint today is pain in the right calf really from the mid aspect superiorly. This is very tender slightly red but not particularly warm. She did see vein and vascular in the hospital I will see if I can pull their  note 12/6; patient was at Dr. Stephens Shire office today. Great toe pressure on the right is 54 on the left 49 she has a 50 to 74% stenosis of the right mid popliteal on the left at 30 to 49% proximal SFA and a 50 to 74% CFA. She was treated with angioplasty of the right ATA, right TP trunk and peroneal artery as well as an angioplasty of the right SFA to popliteal artery. On 10/26 she underwent angioplasty of the left ATA and left TP trunk she has a history of  a left common femoral endarterectomy and left superficial femoral artery stent as well as a right superficial femoral artery stent. In addition to her arterial disease she has significant venous disease with a history of a right greater saphenous vein ablation and a recent DVT in the right femoral and popliteal vein with insertion of an IVC filter. She has remained on Plavix but her anticoagulant was stopped. She continues to have a lot of swelling and pain in the right leg with significant erythema which I am assuming is stasis dermatitis. She has had substantial wound areas on the right leg including right lateral a large necrotic wound with a smaller biopsy site above this. She has original wound on the left mid tibia with a small area above this as well. On the left she has a small area over the left lateral malleolus and the left lateral foot 12/22-week follow-up. Her right leg looks really terrible. She has another necrotic wound just above her original one on the right posterior leg. Greenish drainage suggestive of gram-negative/Pseudomonas infection. The entire mid part of her leg is erythematous I think most of this is stasis dermatitis rather than cellulitis. She has also a bothersome ischemic-looking right fourth toe which I noticed today without her really complaining. She has not been systemically unwell although she looks more physically frail 12/27; I reached out to vein and vascular last weekend Dr. [name] will be seeing her on  Thursday. She has a gangrenous area on the tip of the right fourth toe. Multiple areas breaking down. I think this is a combination of arterial and severe venous insufficiency she has multiple wounds in the right leg but not a lot of pain. She has 3 small areas on the left leg we have been using Santyl. On the right leg we have been using silver alginate or at least that is what they have been using Culture I did last week showed abundant is Pseudomonas aeruginosa had a few Enterococcus faecalis. I have been using Ceftin here predominantly because of the green drainage suggesting Pseudomonas I have not yet addressed the Enterococcus which was ampicillin sensitive 1/7; the patient was kindly seen by vein and vascular. She underwent a repeat ANGIOGRAM on 01/02/2021 by Dr. Donzetta Matters. She appears to have had bilateral common femoral treatments in the past. The right side is heavily heavily calcified and she has 1 area of 50% stenosis in the SFA that is nonflow limiting. She has dominant runoff via the anterior tibial and peroneal arteries. On the left side she also has a heavily calcified SFA no flow-limiting stenosis in the popliteal artery then she she then has runoff via the anterior tibial and posterior tibial arteries without flow-limiting stenosis. She was not felt to require any intervention. She continues to have pain in the right leg with erythema although the erythema is somewhat better. Green drainage coming from the wounds on the posterior lateral right leg which are clearly necrotic. I have had her on 2-1/2 weeks of cefdinir this is while we awaited further arterial evaluation which is completed. I have now turned some thought to the status of her venous system particularly the DVT she had in November. This was an acute DVT involving the right distal femoral and right popliteal veins. She was then started on Xarelto in combination with Plavix but she required hospitalization in November with GI  bleeding. She had an endoscopy by Dr. Lizbeth Bark of GI. This showed moderately severe esophagitis a combination of both food  particles reflux and Candida. Localized mild inflammation characterized by erythema was found in the cardiac gastric fundus and the gastric body. The duodenal bulb's in the stages of the duodenum were normal. She then underwent a capsule endoscopy which showed evidence of oozing blood from the esophagus but no evidence of small bowel bleeding. She was not restarted on her Xarelto, she continues on Plavix and she has an IVC filter 01/12/2021; we brought this patient in for a nurse visit today however I saw her because of copious amounts of green drainage. Under 3 layer compression the degree of erythema in her lower leg on the right was better however there is indeed large amounts of green drainage I think coming out of the large wound on the posterior lateral leg. There is also a satellite area above this. She complains of fairly constant pain in fact she called our clinic earlier in the week to report that Sorbact which we are using topically was hurting her. We explained that we had never heard Sorbact do this. She has not been systemically unwell. My thoughts are on this lady that she had recent angiograms and there is nothing further that can be done. She has a gangrenous right fourth toe but there should be enough blood flow to heal wounds on her legs. She did not have another intervention. She is on Plavix with an IVC filter for her previous stents. She had an acute DVT in November I believe and was on Xarelto and Plavix but had a GI bleed of I think esophageal source. She has some swelling in the right thigh marked erythema and swelling in the right mid calf. I wonder whether she is extended the clot burden in her thigh as a cause of the distal DVT like symptoms. 1/18; the patient's repeat duplex ultrasound did not show extension of her DVT in fact this had recanalized and  was better. At my request she is seeing Dr. Oneida Alar on Thursday to look over the recent angiogram in the ischemic looking areas in her right fourth and today her right second toe. This foot is cold and I wonder whether she either has microvascular disease and/or severe Raynaud's on top of her known macrovascular disease although her recent angiogram did not show anything that needed a particular procedure. The other issue has been copious amounts of drainage coming out of these large necrotic wounds on her right lateral calf. This is almost a jelly green color and consistency. PCR culture I did last week showed high amounts of Pseudomonas which was hardly surprising but also high amounts of Enterococcus faecalis. She is allergic to penicillin, Levaquin and sulfa. There is not a good oral combination of a single drug that would cover this. I am going to ask for infectious disease. About the only positive thing I can say about the right leg as she is not in as much pain as she was several weeks ago. I gave her a prolonged course of cefdinir orally and have increased her compression. The leg is certainly less red and less tender but she is beginning develop ischemic changes in her right forefoot 1/24; the patient went to see Dr. Oneida Alar who said from an arterial/macrovascular and venous standpoint he thought everything was optimized. He referred her to Dr. [name]. Dr. [name] felt he could debride I presume the major wound area areas on the right posterior lateral calf and place a wound matrix presumably ACell. She sees infectious disease tomorrow I think she needs antibiotics  perhaps IV antibiotics. This is to address the culturing Pseudomonas and Enterococcus from PCR culture that we got 10 days ago. She also has a topical antibiotic compounded which we can apply to this area topically. The patient has expanding necrotic wounds on the right posterior lateral calf and multiple small areas anteriorly. This  whole area was very angry and erythematous when she came back from her arterial evaluation. At that point I gave her 2 weeks of cefdinir with some improvement in the pain and the erythema but things have never gotten any better. 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 Objective Constitutional Vitals Time Taken: 10:55 AM, Height: 68 in, Weight: 132 lbs, BMI: 20.1, Temperature: 98.1 F, Pulse: 84 bpm, Respiratory Rate: 17 breaths/min, Blood Pressure: 148/74 mmHg. Integumentary (Hair, Skin) Wound #16 status is Open. Original cause of wound was Pressure Injury. The wound is located on the Right Calcaneus. The wound measures 0.5cm length x 0.9cm width x 0.4cm depth; 0.353cm^2 area and 0.141cm^3 volume. There is Fat Layer (Subcutaneous Tissue) exposed. There is no tunneling or undermining noted. There is a large amount of serosanguineous drainage noted. The wound margin is distinct with the outline attached to the wound base. There is no granulation within the wound bed. There is a large (67-100%) amount of necrotic tissue within the wound bed including Adherent Slough. Wound #19 status is Open. Original cause of wound was Gradually Appeared. The wound is located on the Right,Lateral Malleolus. The wound measures 2.8cm length x 0.5cm width x 0.1cm depth; 1.1cm^2 area and 0.11cm^3 volume. There is no tunneling or undermining noted. There is a large amount of purulent drainage noted. The wound margin is distinct with the outline attached to the wound base. There is no granulation within the wound bed. There is a large (67- 100%) amount of necrotic tissue within the wound bed including Adherent Slough. Wound #20 status is Open. Original cause of wound was Gradually Appeared. The wound is  located on the Left,Lateral Malleolus. The wound measures 0.9cm length x 0.6cm width x 0.2cm depth; 0.424cm^2 area and 0.085cm^3 volume. There is Fat Layer (Subcutaneous Tissue) exposed. There is no tunneling or undermining noted. There is a large amount of serosanguineous drainage noted. The wound margin is distinct with the outline attached to the wound base. There is no granulation within the wound bed. There is a large (67-100%) amount of necrotic tissue within the wound bed including Adherent Slough. Wound #23 status is Converted. Original cause of wound was Gradually Appeared. The wound is located on the Right,Posterior Lower Leg. The wound measures 10cm length x 10cm width x 0.4cm depth; 78.54cm^2 area and 31.416cm^3 volume. There is no tunneling or undermining noted. There is a large amount of purulent drainage noted. The wound margin is distinct with the outline attached to the wound base. There is no granulation within the wound bed. There is a large (67-100%) amount of necrotic tissue within the wound bed including Eschar and Adherent Slough. General Notes: converted to right lateral wound #26. Wound #24 status is Open. Original cause of wound was Gradually Appeared. The wound is located on the Right,Anterior Lower Leg. The wound measures 1.5cm length x 1cm width x 0.1cm depth; 1.178cm^2 area and 0.118cm^3 volume. There is no tunneling or undermining noted. There  is a large amount of purulent drainage noted. The wound margin is distinct with the outline attached to the wound base. There is no granulation within the wound bed. There is a large (67-100%) amount of necrotic tissue within the wound bed including Eschar and Adherent Slough. Wound #25 status is Open. Original cause of wound was Gradually Appeared. The wound is located on the Left,Lateral Foot. The wound measures 1.2cm length x 0.6cm width x 0.1cm depth; 0.565cm^2 area and 0.057cm^3 volume. There is Fat Layer (Subcutaneous Tissue)  exposed. There is no tunneling or undermining noted. There is a large amount of purulent drainage noted. The wound margin is distinct with the outline attached to the wound base. There is no granulation within the wound bed. There is a large (67-100%) amount of necrotic tissue within the wound bed including Eschar and Adherent Slough. Wound #26 status is Open. Original cause of wound was Gradually Appeared. The wound is located on the Right,Lateral Lower Leg. The wound measures 10cm length x 10cm width x 0.5cm depth; 78.54cm^2 area and 39.27cm^3 volume. There is no tunneling or undermining noted. There is a large amount of purulent drainage noted. The wound margin is distinct with the outline attached to the wound base. There is no granulation within the wound bed. There is a large (67- 100%) amount of necrotic tissue within the wound bed including Eschar and Adherent Slough. Wound #27 status is Open. Original cause of wound was Gradually Appeared. The wound is located on the Right,Distal,Anterior Lower Leg. The wound measures 1.5cm length x 1.5cm width x 0.1cm depth; 1.767cm^2 area and 0.177cm^3 volume. There is no tunneling or undermining noted. There is a large amount of purulent drainage noted. The wound margin is distinct with the outline attached to the wound base. There is no granulation within the wound bed. There is a large (67-100%) amount of necrotic tissue within the wound bed including Eschar and Adherent Slough. Wound #29 status is Open. Original cause of wound was Gradually Appeared. The wound is located on the Right,Proximal,Lateral Lower Leg. The wound measures 3cm length x 1cm width x 0.1cm depth; 2.356cm^2 area and 0.236cm^3 volume. There is no tunneling or undermining noted. There is a large amount of purulent drainage noted. The wound margin is distinct with the outline attached to the wound base. There is no granulation within the wound bed. There is a large (67-100%) amount of  necrotic tissue within the wound bed including Eschar and Adherent Slough. Wound #30 status is Open. Original cause of wound was Gradually Appeared. The wound is located on the Right T Fourth. The wound measures 1.5cm length oe x 2.8cm width x 0.1cm depth; 3.299cm^2 area and 0.33cm^3 volume. There is no tunneling or undermining noted. There is a medium amount of serosanguineous drainage noted. The wound margin is distinct with the outline attached to the wound base. There is no granulation within the wound bed. There is a large (67-100%) amount of necrotic tissue within the wound bed including Eschar. Wound #31 status is Open. Original cause of wound was Gradually Appeared. The wound is located on the Left,Medial Ankle. The wound measures 0.3cm length x 1.1cm width x 0.1cm depth; 0.259cm^2 area and 0.026cm^3 volume. There is Fat Layer (Subcutaneous Tissue) exposed. There is no tunneling or undermining noted. There is a large amount of serosanguineous drainage noted. The wound margin is distinct with the outline attached to the wound base. There is no granulation within the wound bed. There is a large (67-100%) amount  of necrotic tissue within the wound bed including Adherent Slough. Wound #32 status is Open. Original cause of wound was Gradually Appeared. The wound is located on the Right T Great. The wound measures 1.7cm length oe x 1.8cm width x 0.1cm depth; 2.403cm^2 area and 0.24cm^3 volume. There is no tunneling or undermining noted. There is a large amount of purulent drainage noted. The wound margin is distinct with the outline attached to the wound base. There is no granulation within the wound bed. There is a large (67-100%) amount of necrotic tissue within the wound bed including Adherent Slough. Wound #33 status is Open. Original cause of wound was Pressure Injury. The wound is located on the Right T Second. The wound measures 1.6cm length x oe 0.7cm width x 0.1cm depth; 0.88cm^2 area and  0.088cm^3 volume. There is Fat Layer (Subcutaneous Tissue) exposed. There is no tunneling or undermining noted. There is a large amount of serosanguineous drainage noted. The wound margin is distinct with the outline attached to the wound base. There is no granulation within the wound bed. There is a large (67-100%) amount of necrotic tissue within the wound bed including Eschar and Adherent Slough. Wound #5 status is Open. Original cause of wound was Gradually Appeared. The wound is located on the Right,Medial Lower Leg. The wound measures 2cm length x 2cm width x 0.2cm depth; 3.142cm^2 area and 0.628cm^3 volume. There is no tunneling or undermining noted. There is a large amount of purulent drainage noted. The wound margin is distinct with the outline attached to the wound base. There is no granulation within the wound bed. There is a large (67- 100%) amount of necrotic tissue within the wound bed including Eschar and Adherent Slough. 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 There was a  Chemical/Enzymatic/Mechanical debridement performed by Ricard Dillon., MD.. Other agent used was anasept and gauze. There was no bleeding. The procedure was tolerated well. Post Debridement Measurements: 3cm length x 1cm width x 0.1cm depth; 0.236cm^3 volume. Character of Wound/Ulcer Post Debridement requires further debridement. Severity of Tissue Post Debridement is: Fat layer exposed. Post procedure Diagnosis Wound #29: Same as Pre-Procedure Wound #31 Pre-procedure diagnosis of Wound #31 is a Venous Leg Ulcer located on the Left,Medial Ankle .Severity of Tissue Pre Debridement is: Fat layer exposed. There was a Chemical/Enzymatic/Mechanical debridement performed by Ricard Dillon., MD.. Other agent used was anasept and gauze. There was no bleeding. The procedure was tolerated well. Post Debridement Measurements: 0.3cm length x 1.1cm width x 0.1cm depth; 0.026cm^3 volume. Character of Wound/Ulcer Post Debridement requires further debridement. Severity of Tissue Post Debridement is: Fat layer exposed. 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 There was a Chemical/Enzymatic/Mechanical debridement oe performed by Ricard Dillon., MD.. Other agent used was anasept and gauze. There was no bleeding. The procedure was tolerated well. Post Debridement Measurements: 1.7cm length x 1.8cm width x 0.1cm depth; 0.24cm^3 volume. Character of Wound/Ulcer Post Debridement requires further debridement. Post procedure Diagnosis Wound #32: Same as Pre-Procedure Plan Follow-up Appointments: Return Appointment in 1 week. Bathing/ Shower/ Hygiene: May shower and wash wound with soap and water. - on days that dressing is changed Edema Control - Lymphedema / SCD / Other: Elevate legs to the level of the heart or above for 30 minutes daily and/or when sitting, a frequency of: - throughout the  day Avoid standing for long periods of time. Exercise regularly WOUND #16: - Calcaneus Wound Laterality: Right Cleanser: Soap and Water Every Other Day/30 Days Discharge Instructions: May shower and wash wound with dial antibacterial soap and water prior to dressing change. Peri-Wound Care: Sween Lotion (Moisturizing lotion) Every Other Day/30 Days Discharge Instructions: Apply moisturizing lotion as directed Topical: Gentamicin Every Other Day/30 Days Discharge Instructions: As directed by physician Topical: Keystone antibiotic Every Other Day/30 Days Discharge Instructions: Once received, stop the gentamicin and Apply Keystone under the calcium alginate with silver. Prim Dressing: KerraCel Ag Gelling Fiber Dressing, 4x5 in (silver alginate) (Generic) Every Other Day/30 Days ary Discharge Instructions: Apply silver alginate to wound bed as instructed Secondary Dressing: Woven Gauze Sponge, Non-Sterile 4x4 in (Generic) Every Other Day/30 Days Discharge Instructions: Apply over primary dressing as directed. Secondary Dressing: ABD Pad, 8x10 Every Other Day/30 Days Discharge Instructions: Apply over primary dressing as directed. Secondary Dressing: CarboFLEX Odor Control Dressing, 4x4 in Every Other Day/30 Days Discharge Instructions: Apply over primary dressing as directed. Com pression Wrap: Kerlix Roll 4.5x3.1 (in/yd) (Generic) Every Other Day/30 Days Discharge Instructions: Apply Kerlix and Coban compression as directed. Com pression Wrap: Coban Self-Adherent Wrap 4x5 (in/yd) (Generic) Every Other Day/30 Days Discharge Instructions: Apply over Kerlix as directed. WOUND #19: - Malleolus Wound Laterality: Right, Lateral Cleanser: Soap and Water Every Other Day/30 Days Discharge Instructions: May shower and wash wound with dial antibacterial soap and water prior to dressing change. Peri-Wound Care: Sween Lotion (Moisturizing lotion) Every Other Day/30 Days Discharge Instructions: Apply  moisturizing lotion as directed Topical: Gentamicin Every Other Day/30 Days Discharge Instructions: As directed by physician Topical: Keystone antibiotic Every Other Day/30 Days Discharge Instructions: Once received, stop the gentamicin and Apply Keystone under the calcium alginate with silver. Prim Dressing: KerraCel Ag Gelling Fiber Dressing, 4x5 in (silver alginate) (Generic) Every Other Day/30 Days ary Discharge Instructions: Apply silver alginate to wound bed as instructed Secondary Dressing: Woven Gauze Sponge, Non-Sterile 4x4 in (Generic) Every Other Day/30 Days Discharge Instructions: Apply over primary dressing as directed. Secondary Dressing: ABD Pad, 8x10 Every Other Day/30 Days Discharge Instructions: Apply over primary dressing as directed. Com pression Wrap: Kerlix Roll 4.5x3.1 (in/yd) (Generic) Every Other Day/30 Days Discharge Instructions: Apply Kerlix and Coban compression as directed. Com pression Wrap: Coban Self-Adherent Wrap 4x5 (in/yd) (Generic) Every Other Day/30 Days Discharge Instructions: Apply over Kerlix as directed. WOUND #20: - Malleolus Wound Laterality: Left, Lateral Cleanser: Soap and Water 1 x Per Week/7 Days Discharge Instructions: May shower and wash wound with dial antibacterial soap and water prior to dressing change. Peri-Wound Care: Sween Lotion (Moisturizing lotion) 1 x Per Week/7 Days Discharge Instructions: Apply moisturizing lotion as directed Prim Dressing: IODOFLEX 0.9% Cadexomer Iodine Pad 4x6 cm  1 x Per Week/7 Days ary Discharge Instructions: Apply to wound bed as instructed Secondary Dressing: Woven Gauze Sponge, Non-Sterile 4x4 in (Generic) 1 x Per Week/7 Days Discharge Instructions: Apply over primary dressing as directed. Secondary Dressing: ABD Pad, 8x10 1 x Per Week/7 Days Discharge Instructions: Apply over primary dressing as directed. Com pression Wrap: Kerlix Roll 4.5x3.1 (in/yd) (Generic) 1 x Per Week/7 Days Discharge  Instructions: Apply Kerlix and Coban compression as directed. Com pression Wrap: Coban Self-Adherent Wrap 4x5 (in/yd) (Generic) 1 x Per Week/7 Days Discharge Instructions: Apply over Kerlix as directed. WOUND #24: - Lower Leg Wound Laterality: Right, Anterior Cleanser: Soap and Water Every Other Day/30 Days Discharge Instructions: May shower and wash wound with dial antibacterial soap and water prior to dressing change. Peri-Wound Care: Sween Lotion (Moisturizing lotion) Every Other Day/30 Days Discharge Instructions: Apply moisturizing lotion as directed Topical: Gentamicin Every Other Day/30 Days Discharge Instructions: As directed by physician Topical: Keystone antibiotic Every Other Day/30 Days Discharge Instructions: Once received, stop the gentamicin and Apply Keystone under the calcium alginate with silver. Prim Dressing: KerraCel Ag Gelling Fiber Dressing, 4x5 in (silver alginate) (Generic) Every Other Day/30 Days ary Discharge Instructions: Apply silver alginate to wound bed as instructed Secondary Dressing: Woven Gauze Sponge, Non-Sterile 4x4 in (Generic) Every Other Day/30 Days Discharge Instructions: Apply over primary dressing as directed. Secondary Dressing: ABD Pad, 8x10 Every Other Day/30 Days Discharge Instructions: Apply over primary dressing as directed. Com pression Wrap: Kerlix Roll 4.5x3.1 (in/yd) (Generic) Every Other Day/30 Days Discharge Instructions: Apply Kerlix and Coban compression as directed. Com pression Wrap: Coban Self-Adherent Wrap 4x5 (in/yd) (Generic) Every Other Day/30 Days Discharge Instructions: Apply over Kerlix as directed. WOUND #25: - Foot Wound Laterality: Left, Lateral Cleanser: Soap and Water 1 x Per Week/7 Days Discharge Instructions: May shower and wash wound with dial antibacterial soap and water prior to dressing change. Peri-Wound Care: Sween Lotion (Moisturizing lotion) 1 x Per Week/7 Days Discharge Instructions: Apply moisturizing  lotion as directed Prim Dressing: IODOFLEX 0.9% Cadexomer Iodine Pad 4x6 cm 1 x Per Week/7 Days ary Discharge Instructions: Apply to wound bed as instructed Secondary Dressing: Woven Gauze Sponge, Non-Sterile 4x4 in (Generic) 1 x Per Week/7 Days Discharge Instructions: Apply over primary dressing as directed. Secondary Dressing: ABD Pad, 8x10 1 x Per Week/7 Days Discharge Instructions: Apply over primary dressing as directed. Com pression Wrap: Kerlix Roll 4.5x3.1 (in/yd) (Generic) 1 x Per Week/7 Days Discharge Instructions: Apply Kerlix and Coban compression as directed. Com pression Wrap: Coban Self-Adherent Wrap 4x5 (in/yd) (Generic) 1 x Per Week/7 Days Discharge Instructions: Apply over Kerlix as directed. WOUND #26: - Lower Leg Wound Laterality: Right, Lateral Cleanser: Soap and Water Every Other Day/30 Days Discharge Instructions: May shower and wash wound with dial antibacterial soap and water prior to dressing change. Peri-Wound Care: Sween Lotion (Moisturizing lotion) Every Other Day/30 Days Discharge Instructions: Apply moisturizing lotion as directed Topical: Gentamicin Every Other Day/30 Days Discharge Instructions: As directed by physician Topical: Keystone antibiotic Every Other Day/30 Days Discharge Instructions: Once received, stop the gentamicin and Apply Keystone under the calcium alginate with silver. Prim Dressing: KerraCel Ag Gelling Fiber Dressing, 4x5 in (silver alginate) (Generic) Every Other Day/30 Days ary Discharge Instructions: Apply silver alginate to wound bed as instructed Secondary Dressing: Woven Gauze Sponge, Non-Sterile 4x4 in (Generic) Every Other Day/30 Days Discharge Instructions: Apply over primary dressing as directed. Secondary Dressing: ABD Pad, 8x10 Every Other Day/30 Days Discharge Instructions: Apply over primary dressing as directed.  Com pression Wrap: Kerlix Roll 4.5x3.1 (in/yd) (Generic) Every Other Day/30 Days Discharge Instructions: Apply  Kerlix and Coban compression as directed. Com pression Wrap: Coban Self-Adherent Wrap 4x5 (in/yd) (Generic) Every Other Day/30 Days Discharge Instructions: Apply over Kerlix as directed. WOUND #27: - Lower Leg Wound Laterality: Right, Anterior, Distal Cleanser: Soap and Water Every Other Day/30 Days Discharge Instructions: May shower and wash wound with dial antibacterial soap and water prior to dressing change. Peri-Wound Care: Sween Lotion (Moisturizing lotion) Every Other Day/30 Days Discharge Instructions: Apply moisturizing lotion as directed Topical: Gentamicin Every Other Day/30 Days Discharge Instructions: As directed by physician Topical: Keystone antibiotic Every Other Day/30 Days Discharge Instructions: Once received, stop the gentamicin and Apply Keystone under the calcium alginate with silver. Prim Dressing: KerraCel Ag Gelling Fiber Dressing, 4x5 in (silver alginate) (Generic) Every Other Day/30 Days ary Discharge Instructions: Apply silver alginate to wound bed as instructed Secondary Dressing: Woven Gauze Sponge, Non-Sterile 4x4 in (Generic) Every Other Day/30 Days Discharge Instructions: Apply over primary dressing as directed. Secondary Dressing: ABD Pad, 8x10 Every Other Day/30 Days Discharge Instructions: Apply over primary dressing as directed. Com pression Wrap: Kerlix Roll 4.5x3.1 (in/yd) (Generic) Every Other Day/30 Days Discharge Instructions: Apply Kerlix and Coban compression as directed. Com pression Wrap: Coban Self-Adherent Wrap 4x5 (in/yd) (Generic) Every Other Day/30 Days Discharge Instructions: Apply over Kerlix as directed. WOUND #29: - Lower Leg Wound Laterality: Right, Lateral, Proximal Cleanser: Soap and Water Every Other Day/30 Days Discharge Instructions: May shower and wash wound with dial antibacterial soap and water prior to dressing change. Peri-Wound Care: Sween Lotion (Moisturizing lotion) Every Other Day/30 Days Discharge Instructions: Apply  moisturizing lotion as directed Topical: Gentamicin Every Other Day/30 Days Discharge Instructions: As directed by physician Topical: Keystone antibiotic Every Other Day/30 Days Discharge Instructions: Once received, stop the gentamicin and Apply Keystone under the calcium alginate with silver. Prim Dressing: KerraCel Ag Gelling Fiber Dressing, 4x5 in (silver alginate) (Generic) Every Other Day/30 Days ary Discharge Instructions: Apply silver alginate to wound bed as instructed Secondary Dressing: Woven Gauze Sponge, Non-Sterile 4x4 in (Generic) Every Other Day/30 Days Discharge Instructions: Apply over primary dressing as directed. Secondary Dressing: ABD Pad, 8x10 Every Other Day/30 Days Discharge Instructions: Apply over primary dressing as directed. Com pression Wrap: Kerlix Roll 4.5x3.1 (in/yd) (Generic) Every Other Day/30 Days Discharge Instructions: Apply Kerlix and Coban compression as directed. Com pression Wrap: Coban Self-Adherent Wrap 4x5 (in/yd) (Generic) Every Other Day/30 Days Discharge Instructions: Apply over Kerlix as directed. WOUND #30: - T Fourth Wound Laterality: Right oe Cleanser: Soap and Water Every Other Day/30 Days Discharge Instructions: May shower and wash wound with dial antibacterial soap and water prior to dressing change. Peri-Wound Care: Sween Lotion (Moisturizing lotion) Every Other Day/30 Days Discharge Instructions: Apply moisturizing lotion as directed Topical: Gentamicin Every Other Day/30 Days Discharge Instructions: As directed by physician Topical: Keystone antibiotic Every Other Day/30 Days Discharge Instructions: Once received, stop the gentamicin and Apply Keystone under the calcium alginate with silver. Prim Dressing: KerraCel Ag Gelling Fiber Dressing, 4x5 in (silver alginate) (Generic) Every Other Day/30 Days ary Discharge Instructions: Apply silver alginate to wound bed as instructed Secondary Dressing: Woven Gauze Sponge, Non-Sterile 4x4  in (Generic) Every Other Day/30 Days Discharge Instructions: Apply over primary dressing as directed. Secured With: Child psychotherapist, Sterile 2x75 (in/in) Every Other Day/30 Days Discharge Instructions: Secure with stretch gauze as directed. WOUND #31: - Ankle Wound Laterality: Left, Medial Cleanser: Soap and Water 1 x Per  Week/7 Days Discharge Instructions: May shower and wash wound with dial antibacterial soap and water prior to dressing change. Peri-Wound Care: Sween Lotion (Moisturizing lotion) 1 x Per Week/7 Days Discharge Instructions: Apply moisturizing lotion as directed Prim Dressing: Cutimed Sorbact Swab (Generic) 1 x Per Week/7 Days ary Discharge Instructions: Apply to wound bed as instructed Secondary Dressing: Woven Gauze Sponge, Non-Sterile 4x4 in (Generic) 1 x Per Week/7 Days Discharge Instructions: Apply over primary dressing as directed. Secondary Dressing: ABD Pad, 8x10 1 x Per Week/7 Days Discharge Instructions: Apply over primary dressing as directed. Com pression Wrap: Kerlix Roll 4.5x3.1 (in/yd) (Generic) 1 x Per Week/7 Days Discharge Instructions: Apply Kerlix and Coban compression as directed. Com pression Wrap: Coban Self-Adherent Wrap 4x5 (in/yd) (Generic) 1 x Per Week/7 Days Discharge Instructions: Apply over Kerlix as directed. WOUND #32: - T Great Wound Laterality: Right oe Cleanser: Soap and Water Every Other Day/30 Days Discharge Instructions: May shower and wash wound with dial antibacterial soap and water prior to dressing change. Peri-Wound Care: Sween Lotion (Moisturizing lotion) Every Other Day/30 Days Discharge Instructions: Apply moisturizing lotion as directed Topical: Gentamicin Every Other Day/30 Days Discharge Instructions: As directed by physician Topical: Keystone antibiotic Every Other Day/30 Days Discharge Instructions: Once received, stop the gentamicin and Apply Keystone under the calcium alginate with silver. Prim  Dressing: KerraCel Ag Gelling Fiber Dressing, 4x5 in (silver alginate) (Generic) Every Other Day/30 Days ary Discharge Instructions: Apply silver alginate to wound bed as instructed Secondary Dressing: Woven Gauze Sponge, Non-Sterile 4x4 in (Generic) Every Other Day/30 Days Discharge Instructions: Apply over primary dressing as directed. Secured With: Child psychotherapist, Sterile 2x75 (in/in) Every Other Day/30 Days Discharge Instructions: Secure with stretch gauze as directed. WOUND #33: - T Second Wound Laterality: Right oe Cleanser: Soap and Water Every Other Day/30 Days Discharge Instructions: May shower and wash wound with dial antibacterial soap and water prior to dressing change. Peri-Wound Care: Sween Lotion (Moisturizing lotion) Every Other Day/30 Days Discharge Instructions: Apply moisturizing lotion as directed Topical: Gentamicin Every Other Day/30 Days Discharge Instructions: As directed by physician Topical: Keystone antibiotic Every Other Day/30 Days Discharge Instructions: Once received, stop the gentamicin and Apply Keystone under the calcium alginate with silver. Prim Dressing: KerraCel Ag Gelling Fiber Dressing, 4x5 in (silver alginate) (Generic) Every Other Day/30 Days ary Discharge Instructions: Apply silver alginate to wound bed as instructed Secondary Dressing: Woven Gauze Sponge, Non-Sterile 4x4 in (Generic) Every Other Day/30 Days Discharge Instructions: Apply over primary dressing as directed. Secured With: Child psychotherapist, Sterile 2x75 (in/in) Every Other Day/30 Days Discharge Instructions: Secure with stretch gauze as directed. WOUND #5: - Lower Leg Wound Laterality: Right, Medial Cleanser: Soap and Water Every Other Day/30 Days Discharge Instructions: May shower and wash wound with dial antibacterial soap and water prior to dressing change. Peri-Wound Care: Sween Lotion (Moisturizing lotion) Every Other Day/30 Days Discharge  Instructions: Apply moisturizing lotion as directed Topical: Gentamicin Every Other Day/30 Days Discharge Instructions: As directed by physician Topical: Keystone antibiotic Every Other Day/30 Days Discharge Instructions: Once received, stop the gentamicin and Apply Keystone under the calcium alginate with silver. Prim Dressing: KerraCel Ag Gelling Fiber Dressing, 4x5 in (silver alginate) (Generic) Every Other Day/30 Days ary Discharge Instructions: Apply silver alginate to wound bed as instructed Secondary Dressing: Woven Gauze Sponge, Non-Sterile 4x4 in (Generic) Every Other Day/30 Days Discharge Instructions: Apply over primary dressing as directed. Secondary Dressing: ABD Pad, 8x10 Every Other Day/30 Days Discharge Instructions: Apply over primary  dressing as directed. Com pression Wrap: Kerlix Roll 4.5x3.1 (in/yd) (Generic) Every Other Day/30 Days Discharge Instructions: Apply Kerlix and Coban compression as directed. Com pression Wrap: Coban Self-Adherent Wrap 4x5 (in/yd) (Generic) Every Other Day/30 Days Discharge Instructions: Apply over Kerlix as directed. 1. The right leg is clearly deteriorating 2. Infectious disease appointment with Dr. Gale Journey tomorrow. I am predominantly asking about antibiotics to address the culture that I did on 1/13 showing Pseudomonas and Enterococcus faecalis. 3. I think Dr. Oneida Alar believes from an arterial and venous point of view that is much has been done for her her as possible. There was no expansion of her recent DVT 4. Dr. [name] is willing to I think debride the larger areas on the right calf and place presumably ACell. 5. The right leg especially is deteriorating. She has macrovascular disease, venous insufficiency scleroderma with pulmonary hypertension. She has ischemic areas on the tips of 3 of her toes on the right which I think are probably related to scleroderma, Raynaud's and small vessel disease. I did talk to her about an amputation I was really  hoping to avoid this Electronic Signature(s) Signed: 01/24/2021 5:14:25 PM By: Linton Ham MD Signed: 01/24/2021 5:42:27 PM By: Baruch Gouty RN, BSN Previous Signature: 01/23/2021 5:37:42 PM Version By: Linton Ham MD Entered By: Baruch Gouty on 01/24/2021 16:26:36 -------------------------------------------------------------------------------- SuperBill Details Patient Name: Date of Service: BLA [name] 01/23/2021 Medical Record Number: [id number] Patient Account Number: [id number] Date of Birth/Sex: Treating RN: 07/12/1951 (70 y.o. 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 I87.331 Chronic venous hypertension (idiopathic) with ulcer and inflammation of right lower extremity L89.616 Pressure-induced deep tissue damage of right heel L97.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 L97.528 Non-pressure chronic ulcer of other part of left foot with other specified severity I70.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 I70.248 Atherosclerosis of native arteries of left leg with ulceration of other part of lower leg Facility Procedures CPT4 Code: [id number] Description: 29518 - WOUND CARE VISIT-LEV 5 EST PT Modifier: Quantity: 1 Physician Procedures : CPT4 Code Description Modifier [id number] - WC PHYS LEVEL 4 - EST PT ICD-10 Diagnosis Description L97.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 I70.238 Atherosclerosis of native  arteries of right leg with ulceration of  other part of lower leg I87.331 Chronic venous hypertension (idiopathic) with ulcer and inflammation of right lower extremity Quantity: 1 Electronic Signature(s) Signed: 01/24/2021 5:14:25 PM By: Linton Ham MD Previous Signature: 01/23/2021 5:37:42 PM Version By: Linton Ham MD Previous Signature: 01/23/2021 6:10:15 PM Version By: Baruch Gouty RN, BSN Entered By: Linton Ham on 01/24/2021 16:29:41

## 2021-01-24 ENCOUNTER — Encounter: Payer: Self-pay | Admitting: Internal Medicine

## 2021-01-24 ENCOUNTER — Other Ambulatory Visit: Payer: Self-pay | Admitting: Internal Medicine

## 2021-01-24 ENCOUNTER — Other Ambulatory Visit: Payer: Self-pay

## 2021-01-24 ENCOUNTER — Telehealth: Payer: Self-pay | Admitting: Hematology and Oncology

## 2021-01-24 ENCOUNTER — Ambulatory Visit (INDEPENDENT_AMBULATORY_CARE_PROVIDER_SITE_OTHER): Payer: Medicare Other | Admitting: Internal Medicine

## 2021-01-24 VITALS — BP 134/67 | HR 84 | Resp 18 | Ht 65.0 in | Wt 118.0 lb

## 2021-01-24 DIAGNOSIS — Z880 Allergy status to penicillin: Secondary | ICD-10-CM

## 2021-01-24 DIAGNOSIS — I739 Peripheral vascular disease, unspecified: Secondary | ICD-10-CM

## 2021-01-24 DIAGNOSIS — T148XXA Other injury of unspecified body region, initial encounter: Secondary | ICD-10-CM

## 2021-01-24 DIAGNOSIS — I872 Venous insufficiency (chronic) (peripheral): Secondary | ICD-10-CM | POA: Diagnosis not present

## 2021-01-24 DIAGNOSIS — S81801A Unspecified open wound, right lower leg, initial encounter: Secondary | ICD-10-CM | POA: Diagnosis not present

## 2021-01-24 DIAGNOSIS — M341 CR(E)ST syndrome: Secondary | ICD-10-CM | POA: Diagnosis not present

## 2021-01-24 NOTE — Telephone Encounter (Signed)
Received a new hem referral from Vicie Mutters from [facility] for IDA. Ms. Schreurs has been scheduled to see Dr. Lorenso Courier on 1/27 at 1pm. Pt aware to arrive 20 minutes early.

## 2021-01-24 NOTE — Patient Instructions (Signed)
I agree you appear to have infected wound with pseudomonas. The enterococcus likely is not playing a role in your infection   We will treat you with cefepime 2 gram iv q12hour for 2 weeks (this will given you some preparation for plastic surgery grafting). If you are admitted for surgery, please let your team call infectious disease to review the wound and see how much more antibiotics to treat you with  We'll setup an appointment to see [place] again in clinic in 10-14 days as well in case surgery fall through   Home health will provide antibiotics once PICC is placed   In terms of prognosis, I am not able to tell at this time if you'll respond well given your immunosuppression and connective tissue disease

## 2021-01-24 NOTE — Telephone Encounter (Signed)
Created in error

## 2021-01-24 NOTE — Progress Notes (Signed)
Call placed to [facility] IR and left voicemail with Caryl Pina to schedule patient for picc line placement. Patient will also need first dosing with short stay.  Referral faxed to [facility]. Awaiting IR appointment. Will continue to follow [facility]

## 2021-01-24 NOTE — Progress Notes (Signed)
  ID chart prep for 1/25 2pm visit  Patient referred by wound care center for LE chronic wound, ? 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 Monophasic waveforms pta and dorsalis pedis. Vascular surgery evaluation didn't feel evidence of arterial insufficiency that would ipair wound healing.  05/2020 bilateral lateral malleolus wound developed  09/2020 repeated angioplasty right anterior tibial artery and tibioperoneal trunk and right superficial femoral artery. subsequewntly angioplasty of left anterior tibial artery and left tibial peroneal trunk  10/2020 acute right LE dvt. S/p ivc filter due to gib; xarelto stopped late 10/2020. egd showed mostly esophagitis  01/17/2021 repeat duplex u/s did not show extension of her dvt; RLE affected veins recanulated and appear better  The past few weeks copious  greenish discharge from the rle wound and increased redness/swelling; wound cx pcr show pseudomonas, e faecalis  Patient allergic to pcn, levaquin, sulfa. So referred to ID. She had been started on cefdinir. Compression dressing also applied but she had developed some ischemic distal changes. The pain/redness have been better.   Last wound note visit 1/18 "right leg is deteriorating." consider amputation    Labs: 1/13 superficial wound APS pcr Pseudomonas aeruginosa high burden (no quinolone or beta lactam/bli, or cefepime, or aminoglycoside gene detected) E faecalis high burden    Review of wound care section also suggest patient has been getting topical gentamycin

## 2021-01-24 NOTE — Progress Notes (Signed)
[facility] for Infectious Disease  Reason for Consult:chronic wound infection Referring Provider: wound center [place]    Patient Active Problem List   Diagnosis Date Noted  . Anemia due to chronic blood loss 01/18/2021  . Anxiety state 01/18/2021  . 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 Disorder of calcium metabolism 01/18/2021  . Gastro-esophageal reflux disease with esophagitis, without bleeding 01/18/2021  . Oxygen dependent 01/18/2021  . Ulcer of esophagus with bleeding 01/18/2021  . Gangrene of finger of right hand ([place]) 11/29/2020  . Malnutrition of moderate degree 11/22/2020  . GI bleed 11/21/2020  . Eustachian tube dysfunction, bilateral 07/29/2020  . 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 Wrist fracture 11/24/2019  . Multiple closed stable fractures of pubic ramus ([place]) 11/23/2019  . Distal radius fracture, left 11/23/2019  . Fall at home, initial encounter 11/23/2019  . AMS (altered mental status) 11/12/2019  . Altered mental status 11/11/2019  . Hypomagnesemia 11/11/2019  . Osteoporosis 01/22/2019  . Hypothyroidism 06/16/2018  . Pulmonary hypertension associated with systemic disorder ([place]) 10/08/2017  . 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 Dry gangrene ([place]) 09/11/2017  . Medication reaction 09/11/2017  . ILD (interstitial lung disease) ([place]) 07/22/2017  . Nasal septal perforation 07/10/2017  . Chronic respiratory failure ([place]) 10/31/2016  . Atherosclerosis of native arteries of right leg with ulceration of unspecified site ([place]) 09/14/2016  . Prolonged Q-T interval on ECG 07/29/2016  . CKD (chronic kidney disease), stage III ([place]) 07/29/2016  . Scleroderma ([place]) 05/06/2016  . Depression 04/24/2016  . Extensor tenosynovitis of wrist, right 02/24/2016  . 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 Hypokalemia 10/07/2015  . Varicose veins of right  lower extremity with ulcer of calf ([place]) 09/01/2015  . Allergic rhinitis 03/08/2015  . High risk medication use 04/09/2013  . Venous hypertension, chronic, with ulcer and inflammation ([place]) 01/29/2013  . Lymphoma, non-Hodgkin's ([place]) 01/27/2013  . Right heart failure ([place]) 01/27/2013  . Varicose veins of lower extremities with ulcer and inflammation ([place]) 07/24/2012  . 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[place]) 04/08/2012  . Pulmonary hypertension associated with systemic disorder ([place]) 03/25/2012  . Peripheral arterial occlusive disease ([place]) 08/10/2011  . Essential hypertension 08/10/2011  . Hyperlipidemia 08/10/2011  . 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 At that time abi right 0.86, left 1.0  Previous superficial cx mrsa in 2016 There is a biopsy of the wound 09/2015 "lipodermatosclerosis, without malignancy"  Repeated wound care related debridement (along with hydrofera blue and apligraf, iodoflex, santyl, and theraskin) at one time in 2016 noted calcium deposition within surface of the wound  Wound progressively worse in terms of size/depth by summer 2017  08/2016 angiogram with right femoral endarterectomy/dacron patchy angioplasty   Wound care had deemed patient "not candidate for skin grafting due to scleroderma/venous  hypertension."  Also mentions of  calcification RUE  01/2019 repeat ABI 0.83 right, 1.02 left. Monophasic waveforms pta and dorsalis pedis. Vascular surgery evaluation didn't feel evidence of arterial insufficiency that would ipair wound healing.  05/2020 bilateral lateral malleolus wound developed  09/2020 repeated angioplasty right anterior tibial artery and tibioperoneal trunk and right superficial femoral artery. subsequewntly angioplasty of left anterior tibial artery and left tibial peroneal trunk  10/2020 acute right LE dvt. S/p ivc filter due to gib; xarelto stopped late 10/2020. egd showed mostly esophagitis  01/17/2021 repeat duplex u/s did not show extension of her dvt; RLE affected veins recanulated and appear better  The past few weeks copious greenish discharge from the rle wound and increased redness/swelling; wound cx pcr show pseudomonas, e faecalis  Patient allergic to pcn, levaquin, sulfa. So referred to ID. She had been started on cefdinir. Compression dressing also applied but she had developed some ischemic distal changes. The pain/redness have been better.   Last wound note visit 1/18 "right leg is deteriorating." consider amputation    Labs: 1/13 superficial wound APS pcr Pseudomonas aeruginosa high burden (no quinolone or beta lactam/bli, or cefepime, or aminoglycoside gene detected) E faecalis high burden    Review of wound care section also suggest patient has been getting topical gentamycin   Patient will see plastic surgery on 2/7 with plan to debride the ulcer/eschar and placed some kind of graft  Penicillin allergy -- 34 years ago. Rash on face. She takes cephalexin and other cephalosporin products fine. havent tried amoxicillin or augmentin yet.  levaquin -- face swelling years ago early 2000s. Patient had ciprofloxacin after and she did ok with that   Patient reports she also has been given topical silver product for 2 weeks. And noted improvement after that  She  still have some redness/mild pain/green discharge  No fever/chill  She recently finished 2 weeks some cephalosporin  She never smokes   Meds: Sildenafil Prednisone 5 mg a day (current dose for 9 years) No other immunosuppressants  Review of Systems: ROS       Past Medical History:  Diagnosis Date  . Anemia   . Arthritis   . DOE (dyspnea on exertion)   . Epistaxis   . History of chicken pox   . Hypertension   . Hypothyroidism   . Leg ulcer ([place])   . Melanoma ([place]) 1999  . 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[place])   . Non Hodgkin's lymphoma ([place])    Treated with chemo 2010, felt to be cured.  Marland Kitchen PAH (pulmonary artery hypertension) ([place])   . Peripheral arterial occlusive disease ([place])   . Pulmonary hypertension ([place])   . Requires supplemental oxygen    2 liters-3liters when moving  . 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 Smoking status: Never Smoker  . Smokeless tobacco: Never Used  Vaping Use  . Vaping Use: Never used  Substance Use Topics  . Alcohol use: Yes    Alcohol/week: 0.0 standard drinks    Comment: 1-2 drinks per day wine.liquor or beer  . Drug use: No    Family History  Problem Relation Age of Onset  . Lupus Father   . Lymphoma Father   . Hyperlipidemia Mother   . Cancer Daughter        sarcoma  . Colon cancer Other     Allergies  Allergen Reactions  . Levofloxacin Other (See Comments)    FLU LIKE SYMPTOM   .  Sulfa Antibiotics Rash and Other (See Comments)    Face peeled Severe rash with significant peeling of face. No airway involvement per patient   . Doxycycline Hyclate Swelling    SWELLING REACTION UNSPECIFIED  "TABLETS ONLY - CAPSULES ARE TOLERATED FINE"  . Penicillins Rash    Has patient had a PCN reaction causing immediate rash, facial/tongue/throat swelling, SOB or lightheadedness with hypotension: no Has patient had a PCN reaction causing severe rash involving mucus membranes or skin necrosis: no Has  patient had a PCN reaction that required hospitalization: no Has patient had a PCN reaction occurring within the last 10 years: {no If all of the above answers are "NO", then may proceed with Cephalosporin use.    OBJECTIVE: Vitals:   01/24/21 1424  BP: 134/67  Pulse: 84  Resp: 18  SpO2: 98%  Weight: 118 lb (53.5 kg)  Height: _0  (1.651 m)   Body mass index is 19.64 kg/m.   Physical Exam Here with husband, no distress, conversant, cooperative Heent: atraumatic; per; conj clear Neck supple cv rrr no mrg Lungs clear; on 2 liters oxygen supplement abd s/nt Ext mild edema on RLE; left LE in coban dressing Skin large 3x4 inch irregular edge stage 3 open wound with gelatinous green discharge and eschar at edge and satelite small ulcers, along with erythema and tenderness. No fluctuance Msk: s/p partial several fingers ampuation in the past.  Neuro nonfocal Psych alert/oriented  Lab: 1/11 cr 1.25; lft wnl; cbc 9/8.6/473  Microbiology: 12/20/20 superficial wound cx ?right LE Pseudomonas aearuginosa (pan sensitive) E faecalis  Serology:  Imaging:   Assessment/plan: 70 y.o. female crest, pulm htn, venous stasis, pvd referred by wound center for infected chronic open wound that appear infected    Problem List Items Addressed This Visit   None   Visit Diagnoses    Open wound    -  Primary   Relevant Orders   C-reactive protein   CBC w/Diff   Basic metabolic panel   IR Fluoro Guide CV Line Left   CREST (calcinosis, Raynaud's phenomenon, esophageal dysfunction, sclerodactyly, telangiectasia) (HCC)       Relevant Orders   C-reactive protein   CBC w/Diff   Basic metabolic panel   IR Fluoro Guide CV Line Left   Peripheral vascular disease of extremity (HCC)       Relevant Orders   C-reactive protein   CBC w/Diff   Basic metabolic panel   IR Fluoro Guide CV Line Left     Discussed with her uncertainty in prognosis given her morbidities. We can try treating  with abx, but agree a surgical component will be needed given eschar and poor blood penetration. I do worry that given her vasculopathy/connective tissue disease, further surgery also could be more damaging in terms of wound healing  We'll have to see how she responds  Plan 2 weeks iv cefepime. Advise her to discuss with surgeyr team to get involved when she is admitted for surgery to see if further abx needed  -f/u 10-14 days -picc per ir ordered -iv cefepime 2 gram q12hours x2 weeks -labs today including crp -weekly cbc, cmp, crp on iv abx  -consider after all this addressed/healed to have outpatient allergy testing for pcn/ciprofloxacin testing  I spend 60 minutes reviewing data/chart and >50% time face to face counseling/discussing treatment with patient     Follow-up: Return in about 2 weeks (around 02/07/2021).  Jabier Mutton, [facility] for Infectious Disease [facility]  Group -- -- pager   [phone] cell 01/24/2021, 2:24 PM

## 2021-01-25 ENCOUNTER — Telehealth: Payer: Self-pay | Admitting: *Deleted

## 2021-01-25 LAB — BASIC METABOLIC PANEL
BUN/Creatinine Ratio: 21 (calc) (ref 6–22)
BUN: 22 mg/dL (ref 7–25)
CO2: 26 mmol/L (ref 20–32)
Calcium: 8.5 mg/dL — ABNORMAL LOW (ref 8.6–10.4)
Chloride: 98 mmol/L (ref 98–110)
Creat: 1.04 mg/dL — ABNORMAL HIGH (ref 0.60–0.93)
Glucose, Bld: 103 mg/dL — ABNORMAL HIGH (ref 65–99)
Potassium: 4.7 mmol/L (ref 3.5–5.3)
Sodium: 137 mmol/L (ref 135–146)

## 2021-01-25 LAB — CBC WITH DIFFERENTIAL/PLATELET
Absolute Monocytes: 657 cells/uL (ref 200–950)
Basophils Absolute: 25 cells/uL (ref 0–200)
Basophils Relative: 0.2 %
Eosinophils Absolute: 12 cells/uL — ABNORMAL LOW (ref 15–500)
Eosinophils Relative: 0.1 %
HCT: 28.5 % — ABNORMAL LOW (ref 35.0–45.0)
Hemoglobin: 9.2 g/dL — ABNORMAL LOW (ref 11.7–15.5)
Lymphs Abs: 335 cells/uL — ABNORMAL LOW (ref 850–3900)
MCH: 30.5 pg (ref 27.0–33.0)
MCHC: 32.3 g/dL (ref 32.0–36.0)
MCV: 94.4 fL (ref 80.0–100.0)
MPV: 9.3 fL (ref 7.5–12.5)
Monocytes Relative: 5.3 %
Neutro Abs: 11371 cells/uL — ABNORMAL HIGH (ref 1500–7800)
Neutrophils Relative %: 91.7 %
Platelets: 461 10*3/uL — ABNORMAL HIGH (ref 140–400)
RBC: 3.02 10*6/uL — ABNORMAL LOW (ref 3.80–5.10)
RDW: 15.9 % — ABNORMAL HIGH (ref 11.0–15.0)
Total Lymphocyte: 2.7 %
WBC: 12.4 10*3/uL — ABNORMAL HIGH (ref 3.8–10.8)

## 2021-01-25 LAB — C-REACTIVE PROTEIN: CRP: 35.1 mg/L — ABNORMAL HIGH (ref <8.0)

## 2021-01-25 NOTE — Telephone Encounter (Signed)
TCT patient to see if she would be ok with moving her appt on 01/26/21 from 1pm to 2pm. She stated that this would be ok. Scheduling message sent.

## 2021-01-25 NOTE — Progress Notes (Signed)
[name] [name], [name] [name] ([id number]) Visit Report for 01/19/2021 SuperBill Details Patient Name: Date of Service: [name] 01/19/2021 Medical Record Number: [id number] Patient Account Number: [id number] Date of Birth/Sex: Treating RN: 20-Nov-1951 (70 y.o. [name], [name] Primary Care Provider: [name] Other Clinician: Referring Provider: Treating Provider/Extender: [name] in Treatment: 460 Diagnosis Coding ICD-10 Codes Code Description [phone] Non-pressure chronic ulcer of right calf with necrosis of muscle L97.811 Non-pressure chronic ulcer of other part of right lower leg limited to breakdown of skin I83.222 Varicose veins of left lower extremity with both ulcer of calf and inflammation I87.331 Chronic venous hypertension (idiopathic) with ulcer and inflammation of right lower extremity L89.616 Pressure-induced deep tissue damage of right heel L97.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 L97.528 Non-pressure chronic ulcer of other part of left foot with other specified severity I70.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 I70.248 Atherosclerosis of native arteries of left leg with ulceration of other part of lower leg Facility Procedures CPT4 Code Description Modifier Quantity [id number] - WOUND CARE VISIT-LEV 5 EST PT 1 Electronic Signature(s) Signed: 01/19/2021 4:36:20 PM By: Linton Ham MD Signed: 01/25/2021 5:56:51 PM By: Rhae Hammock RN Entered By: Rhae Hammock on 01/19/2021 13:28:55

## 2021-01-25 NOTE — Progress Notes (Signed)
NDEA, KILROY ([id number]) Visit Report for 01/19/2021 Arrival Information Details Patient Name: Date of Service: [name] 01/19/2021 12:45 PM Medical Record Number: [id number] Patient Account Number: [id number] Date of Birth/Sex: Treating RN: Jan 10, 1951 (70 y.o. [name], [name] Primary Care Braeden Kennan: [name] Other Clinician: Referring Jorene Kaylor: Treating Danasia Baker/Extender: [name] in Treatment: 31 Visit Information History Since Last Visit Added or deleted any medications: No Patient Arrived: Wheel Chair Any new allergies or adverse reactions: No Arrival Time: 12:58 Had a fall or experienced change in No Accompanied By: husband activities of daily living that may affect Transfer Assistance: None risk of falls: Patient Identification Verified: Yes Signs or symptoms of abuse/neglect since last visito No Secondary Verification Process Completed: Yes Hospitalized since last visit: No Patient Requires Transmission-Based Precautions: No Implantable device outside of the clinic excluding No Patient Has Alerts: Yes cellular tissue based products placed in the center Patient Alerts: R ABI: [place] TBI: 0.41 since last visit: L ABI: [place] (07/2020) Has Dressing in Place as Prescribed: Yes Pain Present Now: No Electronic Signature(s) Signed: 01/25/2021 5:56:51 PM By: Rhae Hammock RN Entered By: Rhae Hammock on 01/19/2021 12:59:13 -------------------------------------------------------------------------------- Clinic Level of Care Assessment Details Patient Name: Date of Service: [name] 01/19/2021 12:45 PM Medical Record Number: [id number] Patient Account Number: [id number] Date of Birth/Sex: Treating RN: 02-Oct-1951 (70 y.o. [name], [name] Primary Care Oumar Marcott: [name] Other Clinician: Referring Danell Verno: Treating Sherrell Weir/Extender: [name] in Treatment: 40 Clinic Level of Care  Assessment Items TOOL 4 Quantity Score X- 1 0 Use when only an EandM is performed on FOLLOW-UP visit ASSESSMENTS - Nursing Assessment / Reassessment X- 1 10 Reassessment of Co-morbidities (includes updates in patient status) X- 1 5 Reassessment of Adherence to Treatment Plan ASSESSMENTS - Wound and Skin A ssessment / Reassessment _0  - 0 Simple Wound Assessment / Reassessment - one wound X- 14 5 Complex Wound Assessment / Reassessment - multiple wounds _1  - 0 Dermatologic / Skin Assessment (not related to wound area) ASSESSMENTS - Focused Assessment _2  - 0 Circumferential Edema Measurements - multi extremities X- 1 10 Nutritional Assessment / Counseling / Intervention X- 1 5 Lower Extremity Assessment (monofilament, tuning fork, pulses) _3  - 0 Peripheral Arterial Disease Assessment (using hand held doppler) ASSESSMENTS - Ostomy and/or Continence Assessment and Care _4  - 0 Incontinence Assessment and Management _5  - 0 Ostomy Care Assessment and Management (repouching, etc.) PROCESS - Coordination of Care _6  - 0 Simple Patient / Family Education for ongoing care X- 1 20 Complex (extensive) Patient / Family Education for ongoing care X- 1 10 Staff obtains Programmer, systems, Records, T Results / Process Orders est _7  - 0 Staff telephones HHA, Nursing Homes / Clarify orders / etc _8  - 0 Routine Transfer to another Facility (non-emergent condition) _9  - 0 Routine Hospital Admission (non-emergent condition) _10  - 0 New Admissions / Biomedical engineer / Ordering NPWT Apligraf, etc. , _11  - 0 Emergency Hospital Admission (emergent condition) _12  - 0 Simple Discharge Coordination X- 1 15 Complex (extensive) Discharge Coordination PROCESS - Special Needs _13  - 0 Pediatric / Minor Patient Management _14  - 0 Isolation Patient Management _15  - 0 Hearing / Language / Visual special needs _16  - 0 Assessment of Community assistance (transportation, D/C planning, etc.) _17  -  0 Additional assistance / Altered mentation _18  - 0 Support Surface(s) Assessment (bed, cushion, seat, etc.) INTERVENTIONS - Wound Cleansing / Measurement _19  - 0 Simple  Wound Cleansing - one wound X- 14 5 Complex Wound Cleansing - multiple wounds X- 1 5 Wound Imaging (photographs - any number of wounds) _0  - 0 Wound Tracing (instead of photographs) _1  - 0 Simple Wound Measurement - one wound X- 14 5 Complex Wound Measurement - multiple wounds INTERVENTIONS - Wound Dressings _2  - 0 Small Wound Dressing one or multiple wounds _3  - 0 Medium Wound Dressing one or multiple wounds X- 14 20 Large Wound Dressing one or multiple wounds <QJJHERDEYCXKGYJE>_5<\/UDJSHFWYOVZCHYIF>_0  - 0 Application of Medications - topical <YDXAJOINOMVEHMCN>_4<\/BSJGGEZMOQHUTMLY>_6  - 0 Application of Medications - injection INTERVENTIONS - Miscellaneous _6  - 0 External ear exam _7  - 0 Specimen Collection (cultures, biopsies, blood, body fluids, etc.) _8  - 0 Specimen(s) / Culture(s) sent or taken to Lab for analysis _9  - 0 Patient Transfer (multiple staff / Civil Service fast streamer / Similar devices) _10  - 0 Simple Staple / Suture removal (25 or less) _11  - 0 Complex Staple / Suture removal (26 or more) _12  - 0 Hypo / Hyperglycemic Management (close monitor of Blood Glucose) _13  - 0 Ankle / Brachial Index (ABI) - do not check if billed separately X- 1 5 Vital Signs Has the patient been seen at the hospital within the last three years: Yes Total Score: 575 Level Of Care: New/Established - Level 5 Electronic Signature(s) Signed: 01/25/2021 5:56:51 PM By: Rhae Hammock RN Entered By: Rhae Hammock on 01/19/2021 13:28:40 -------------------------------------------------------------------------------- Encounter Discharge Information Details Patient Name: Date of Service: BLA [name]. 01/19/2021 12:45 PM Medical Record Number: [id number] Patient Account Number: [id number] Date of Birth/Sex: Treating RN: 05/26/1951 (70 y.o. [name], [name] Primary Care Desarae Placide: [name]  Other Clinician: Referring Kyndahl Jablon: Treating Jerusha Reising/Extender: [name] in Treatment: [phone] Encounter Discharge Information Items Discharge Condition: Stable Ambulatory Status: Wheelchair Discharge Destination: Home Transportation: Private Auto Accompanied By: husband Schedule Follow-up Appointment: Yes Clinical Summary of Care: Patient Declined Electronic Signature(s) Signed: 01/25/2021 5:56:51 PM By: Rhae Hammock RN Entered By: Rhae Hammock on 01/19/2021 13:26:33 -------------------------------------------------------------------------------- Patient/Caregiver Education Details Patient Name: Date of Service: BLA [name] 1/20/2022andnbsp12:45 PM Medical Record Number: [id number] Patient Account Number: [id number] Date of Birth/Gender: Treating RN: 12/24/1951 (70 y.o. [name], [name] Primary Care Physician: [name] Other Clinician: Referring Physician: Treating Physician/Extender: [name] in Treatment: 44 Education Assessment Education Provided To: Patient Education Topics Provided Wound/Skin Impairment: Handouts: Caring for Your Ulcer, Skin Care Do's and Dont's Methods: Explain/Verbal Responses: State content correctly Electronic Signature(s) Signed: 01/25/2021 5:56:51 PM By: Rhae Hammock RN Entered By: Rhae Hammock on 01/19/2021 13:26:15 -------------------------------------------------------------------------------- Wound Assessment Details Patient Name: Date of Service: BLA [name] 01/19/2021 12:45 PM Medical Record Number: [id number] Patient Account Number: [id number] Date of Birth/Sex: Treating RN: Aug 14, 1951 (70 y.o. [name], [name] Primary Care Noeli Lavery: [name] Other Clinician: Referring Kristena Wilhelmi: Treating Ottie Tillery/Extender: [name] in Treatment: 460 Wound Status Wound Number: 16 Primary Etiology: Pressure Ulcer Wound  Location: Right Calcaneus Wound Status: Open Wounding Event: Pressure Injury Date Acquired: 01/04/2020 Weeks Of Treatment: 54 Clustered Wound: No Wound Measurements Length: (cm) 0.5 Width: (cm) 0.9 Depth: (cm) 0.5 Area: (cm) 0.353 Volume: (cm) 0.177 % Reduction in Area: 97.2% % Reduction in Volume: 85.9% Wound Description Classification: Category/Stage III Electronic Signature(s) Signed: 01/25/2021 5:56:51 PM By: Rhae Hammock RN Entered By: Rhae Hammock on 01/19/2021 13:05:02 -------------------------------------------------------------------------------- Wound Assessment Details Patient Name: Date of Service: [name], Sebree A N F.  01/19/2021 12:45 PM Medical Record Number: [id number] Patient Account Number: [id number] Date of Birth/Sex: Treating RN: December 26, 1951 (70 y.o. [name], [name] Primary Care Chrissa Meetze: [name] Other Clinician: Referring Deborah Dondero: Treating Ayuub Penley/Extender: [name] in Treatment: 460 Wound Status Wound Number: 19 Primary Etiology: Venous Leg Ulcer Wound Location: Right, Lateral Malleolus Wound Status: Open Wounding Event: Gradually Appeared Date Acquired: 05/02/2020 Weeks Of Treatment: 37 Clustered Wound: No Wound Measurements Length: (cm) 2 Width: (cm) 0.5 Depth: (cm) 0.1 Area: (cm) 0.785 Volume: (cm) 0.079 Wound Description Classification: Full Thickness Without Exposed Support Structur es % Reduction in Area: -456.7% % Reduction in Volume: -182.1% Electronic Signature(s) Signed: 01/25/2021 5:56:51 PM By: Rhae Hammock RN Entered By: Rhae Hammock on 01/19/2021 13:05:02 -------------------------------------------------------------------------------- Wound Assessment Details Patient Name: Date of Service: [name] 01/19/2021 12:45 PM Medical Record Number: [id number] Patient Account Number: [id number] Date of Birth/Sex: Treating RN: July 29, 1951 (69 y.o. [name],  [name] Primary Care Diondra Pines: [name] Other Clinician: Referring Lei Dower: Treating Kloe Oates/Extender: [name] in Treatment: 460 Wound Status Wound Number: 20 Primary Etiology: Arterial Insufficiency Ulcer Wound Location: Left, Lateral Malleolus Wound Status: Open Wounding Event: Gradually Appeared Date Acquired: 05/02/2020 Weeks Of Treatment: 37 Clustered Wound: No Wound Measurements Length: (cm) 0.7 Width: (cm) 0.5 Depth: (cm) 0.2 Area: (cm) 0.275 Volume: (cm) 0.055 % Reduction in Area: 37.5% % Reduction in Volume: 37.5% Wound Description Classification: Full Thickness Without Exposed Support Structur es Electronic Signature(s) Signed: 01/25/2021 5:56:51 PM By: Rhae Hammock RN Entered By: Rhae Hammock on 01/19/2021 13:05:02 -------------------------------------------------------------------------------- Wound Assessment Details Patient Name: Date of Service: [name] 01/19/2021 12:45 PM Medical Record Number: [id number] Patient Account Number: [id number] Date of Birth/Sex: Treating RN: 1951-07-14 (70 y.o. [name], [name] Primary Care Waco Foerster: [name] Other Clinician: Referring Dorrien Grunder: Treating Emmer Lillibridge/Extender: [name] in Treatment: 460 Wound Status Wound Number: 23 Primary Etiology: Venous Leg Ulcer Wound Location: Right, Posterior Lower Leg Wound Status: Open Wounding Event: Gradually Appeared Date Acquired: 08/15/2020 Weeks Of Treatment: 22 Clustered Wound: Yes Wound Measurements Length: (cm) 9.5 Width: (cm) 10 Depth: (cm) 0.5 Area: (cm) 74.613 Volume: (cm) 37.306 % Reduction in Area: -6544.1% % Reduction in Volume: -33208.9% Wound Description Classification: Full Thickness With Exposed Support Structures Electronic Signature(s) Signed: 01/25/2021 5:56:51 PM By: Rhae Hammock RN Entered By: Rhae Hammock on 01/19/2021  13:05:02 -------------------------------------------------------------------------------- Wound Assessment Details Patient Name: Date of Service: [name] 01/19/2021 12:45 PM Medical Record Number: [id number] Patient Account Number: [id number] Date of Birth/Sex: Treating RN: 09-Jun-1951 (70 y.o. [name], [name] Primary Care Yurika Pereda: [name] Other Clinician: Referring Nubia Ziesmer: Treating Kobyn Kray/Extender: [name] in Treatment: 460 Wound Status Wound Number: 24 Primary Etiology: Venous Leg Ulcer Wound Location: Right, Anterior Lower Leg Wound Status: Open Wounding Event: Gradually Appeared Date Acquired: 10/10/2020 Weeks Of Treatment: 14 Clustered Wound: No Wound Measurements Length: (cm) 1.5 Width: (cm) 0.9 Depth: (cm) 0.2 Area: (cm) 1.06 Volume: (cm) 0.212 % Reduction in Area: 12.4% % Reduction in Volume: 41.6% Wound Description Classification: Full Thickness Without Exposed Support Structur es Electronic Signature(s) Signed: 01/25/2021 5:56:51 PM By: Rhae Hammock RN Entered By: Rhae Hammock on 01/19/2021 13:05:02 -------------------------------------------------------------------------------- Wound Assessment Details Patient Name: Date of Service: [name] 01/19/2021 12:45 PM Medical Record Number: [id number] Patient Account Number: [id number] Date of Birth/Sex: Treating RN: [birth year] (70 y.o. Benjaman Lobe Primary Care Holley Wirt:  [name] Other Clinician: Referring Jullie Arps: Treating Andora Krull/Extender: [name] in Treatment: 460 Wound Status Wound Number: 25 Primary Etiology: Venous Leg Ulcer Wound Location: Left, Lateral Foot Wound Status: Open Wounding Event: Gradually Appeared Date Acquired: 10/10/2020 Weeks Of Treatment: 14 Clustered Wound: No Wound Measurements Length: (cm) 1 Width: (cm) 0.6 Depth: (cm) 0.1 Area: (cm) 0.471 Volume: (cm)  0.047 % Reduction in Area: -24.9% % Reduction in Volume: -23.7% Wound Description Classification: Full Thickness Without Exposed Support Structur es Electronic Signature(s) Signed: 01/25/2021 5:56:51 PM By: Rhae Hammock RN Entered By: Rhae Hammock on 01/19/2021 13:05:02 -------------------------------------------------------------------------------- Wound Assessment Details Patient Name: Date of Service: [name] 01/19/2021 12:45 PM Medical Record Number: [id number] Patient Account Number: [id number] Date of Birth/Sex: Treating RN: January 09, 1951 (70 y.o. [name], [name] Primary Care Xavier Munger: [name] Other Clinician: Referring Elis Rawlinson: Treating Antaeus Karel/Extender: [name] in Treatment: 460 Wound Status Wound Number: 26 Primary Etiology: Venous Leg Ulcer Wound Location: Right, Lateral Lower Leg Wound Status: Open Wounding Event: Gradually Appeared Date Acquired: 10/27/2020 Weeks Of Treatment: 11 Clustered Wound: No Wound Measurements Length: (cm) 15 Width: (cm) 1.9 Depth: (cm) 0.1 Area: (cm) 22.384 Volume: (cm) 2.238 % Reduction in Area: -4422% % Reduction in Volume: -1412.2% Wound Description Classification: Full Thickness Without Exposed Support Structur es Electronic Signature(s) Signed: 01/25/2021 5:56:51 PM By: Rhae Hammock RN Entered By: Rhae Hammock on 01/19/2021 13:05:02 -------------------------------------------------------------------------------- Wound Assessment Details Patient Name: Date of Service: [name] 01/19/2021 12:45 PM Medical Record Number: [id number] Patient Account Number: [id number] Date of Birth/Sex: Treating RN: [birth year] (70 y.o. [name], [name] Primary Care Demontre Padin: Other Clinician: [name] Referring Marvena Tally: Treating Donnie Gedeon/Extender: [name] in Treatment: 460 Wound Status Wound Number: 27 Primary Etiology:  Venous Leg Ulcer Wound Location: Right, Distal, Anterior Lower Leg Wound Status: Open Wounding Event: Gradually Appeared Date Acquired: 11/07/2020 Weeks Of Treatment: 10 Clustered Wound: No Wound Measurements Length: (cm) 1 Width: (cm) 1 Depth: (cm) 0.1 Area: (cm) 0.785 Volume: (cm) 0.079 % Reduction in Area: -177.4% % Reduction in Volume: -182.1% Wound Description Classification: Full Thickness Without Exposed Support Structur es Electronic Signature(s) Signed: 01/25/2021 5:56:51 PM By: Rhae Hammock RN Entered By: Rhae Hammock on 01/19/2021 13:05:02 -------------------------------------------------------------------------------- Wound Assessment Details Patient Name: Date of Service: [name] 01/19/2021 12:45 PM Medical Record Number: [id number] Patient Account Number: [id number] Date of Birth/Sex: Treating RN: [birth year] (70 y.o. [name], [name] Primary Care Mystie Ormand: [name] Other Clinician: Referring Aiyah Scarpelli: Treating Sharod Petsch/Extender: [name] in Treatment: 460 Wound Status Wound Number: 29 Primary Etiology: Arterial Insufficiency Ulcer Wound Location: Right, Proximal, Lateral Lower Leg Wound Status: Open Wounding Event: Gradually Appeared Date Acquired: 12/14/2020 Weeks Of Treatment: 4 Clustered Wound: No Wound Measurements Length: (cm) 1.5 Width: (cm) 1 Depth: (cm) 0.1 Area: (cm) 1.178 Volume: (cm) 0.118 % Reduction in Area: 0% % Reduction in Volume: 83.3% Wound Description Classification: Full Thickness Without Exposed Support Structur es Electronic Signature(s) Signed: 01/25/2021 5:56:51 PM By: Rhae Hammock RN Entered By: Rhae Hammock on 01/19/2021 13:05:02 -------------------------------------------------------------------------------- Wound Assessment Details Patient Name: Date of Service: [name] 01/19/2021 12:45 PM Medical Record Number: [id number] Patient  Account Number: [id number] Date of Birth/Sex: Treating RN: [birth year] (70 y.o. [name], [name] Primary Care Yehudit Fulginiti: [name] Other Clinician: Referring Mylee Falin: Treating Ahmaud Duthie/Extender: [name] in Treatment: 460 Wound Status Wound Number:  30 Primary Etiology: Arterial Insufficiency Ulcer Wound Location: Right T Fourth oe Wound Status: Open Wounding Event: Gradually Appeared Date Acquired: 12/26/2020 Weeks Of Treatment: 3 Clustered Wound: No Wound Measurements Length: (cm) 1.5 Width: (cm) 2.8 Depth: (cm) 0.1 Area: (cm) 3.299 Volume: (cm) 0.33 % Reduction in Area: 25% % Reduction in Volume: 25% Wound Description Classification: Full Thickness Without Exposed Support Structur es Electronic Signature(s) Signed: 01/25/2021 5:56:51 PM By: Rhae Hammock RN Entered By: Rhae Hammock on 01/19/2021 13:05:02 -------------------------------------------------------------------------------- Wound Assessment Details Patient Name: Date of Service: [name] 01/19/2021 12:45 PM Medical Record Number: [id number] Patient Account Number: [id number] Date of Birth/Sex: Treating RN: February 20, 1951 (70 y.o. [name], [name] Primary Care Naraya Stoneberg: [name] Other Clinician: Referring Eniya Cannady: Treating Calista Crain/Extender: [name] in Treatment: 460 Wound Status Wound Number: 31 Primary Etiology: Venous Leg Ulcer Wound Location: Left, Medial Ankle Wound Status: Open Wounding Event: Gradually Appeared Date Acquired: 01/09/2021 Weeks Of Treatment: 1 Clustered Wound: No Wound Measurements Length: (cm) 0.3 Width: (cm) 0.9 Depth: (cm) 0.1 Area: (cm) 0.212 Volume: (cm) 0.021 % Reduction in Area: 71.9% % Reduction in Volume: 72% Wound Description Classification: Full Thickness Without Exposed Support Structur Electronic Signature(s) Signed: 01/25/2021 5:56:51 PM By: Rhae Hammock RN Entered  By: Rosanna Randy, [name] on 01/19/2021 13:05:02 -------------------------------------------------------------------------------- Wound Assessment Details Patient Name: Date of Service: [name] 01/19/2021 12:45 PM Medical Record Number: [id number] Patient Account Number: [id number] Date of Birth/Sex: Treating RN: [birth year] (70 y.o. 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 Wound Status: Open Wounding Event: Gradually Appeared Date Acquired: 01/05/2021 Weeks Of Treatment: 1 Clustered Wound: No Wound Measurements Length: (cm) 1.5 Width: (cm) 1.5 Depth: (cm) 0.1 Area: (cm) 1.767 Volume: (cm) 0.177 % Reduction in Area: -127.1% % Reduction in Volume: -126.9% Wound Description Classification: Full Thickness Without Exposed Support Structur es Electronic Signature(s) Signed: 01/25/2021 5:56:51 PM By: Rhae Hammock RN Entered By: Rhae Hammock on 01/19/2021 13:05:02 -------------------------------------------------------------------------------- Wound Assessment Details Patient Name: Date of Service: [name] 01/19/2021 12:45 PM Medical Record Number: [id number] Patient Account Number: [id number] Date of Birth/Sex: Treating RN: Dec 01, 1951 (70 y.o. [name], [name] Primary Care Halei Hanover: [name] Other Clinician: Referring Nachmen Mansel: Treating Zenia Guest/Extender: [name] in Treatment: 460 Wound Status Wound Number: 33 Primary Etiology: Pressure Ulcer Wound Location: Right T Second oe Wound Status: Open Wounding Event: Pressure Injury Date Acquired: 01/17/2021 Weeks Of Treatment: 0 Clustered Wound: No Wound Measurements Length: (cm) 0.4 Width: (cm) 0.4 Depth: (cm) 0.1 Area: (cm)  0.126 Volume: (cm) 0.013 % Reduction in Area: 0% % Reduction in Volume: 0% Wound Description Classification: Category/Stage III Electronic Signature(s) Signed: 01/25/2021 5:56:51 PM By: Rhae Hammock RN Entered By: Rhae Hammock on 01/19/2021 13:05:03 -------------------------------------------------------------------------------- Wound Assessment Details Patient Name: Date of Service: [name] 01/19/2021 12:45 PM Medical Record Number: [id number] Patient Account Number: [id number] Date of Birth/Sex: Treating RN: Feb 09, 1951 (70 y.o. [name], [name] Primary Care Shenell Rogalski: [name] Other Clinician: Referring Nawal Burling: Treating Dinnis Rog/Extender: [name] in Treatment: 460 Wound Status Wound Number: 5 Primary Etiology: Venous Leg Ulcer Wound Location: Right, Medial Lower Leg Wound Status: Open Wounding Event: Gradually Appeared Date Acquired: 01/13/2013 Weeks Of Treatment: 417 Clustered Wound: Yes Wound  Measurements Length: (cm) 2.3 Width: (cm) 2.1 Depth: (cm) 0.1 Area: (cm) 3.793 Volume: (cm) 0.379 % Reduction in Area: -168.2% % Reduction in Volume: -168.8% Wound Description Classification: Full Thickness With Exposed Support Structures Electronic Signature(s) Signed: 01/25/2021 5:56:51 PM By: Rhae Hammock RN Entered By: Rhae Hammock on 01/19/2021 13:05:03 -------------------------------------------------------------------------------- Vitals Details Patient Name: Date of Service: [name]. 01/19/2021 12:45 PM Medical Record Number: [id number] Patient Account Number: [id number] Date of Birth/Sex: Treating RN: 26-Jan-1951 (70 y.o. [name], [name] Primary Care Emelia Sandoval: [name] Other Clinician: Referring Sarahlynn Cisnero: Treating Lindi Abram/Extender: [name] in Treatment: 460 Vital Signs Time Taken: 12:59 Temperature (F): 98.4 Height (in): 68 Pulse (bpm):  90 Weight (lbs): 132 Respiratory Rate (breaths/min): 17 Body Mass Index (BMI): 20.1 Blood Pressure (mmHg): 178/71 Reference Range: 80 - 120 mg / dl Electronic Signature(s) Signed: 01/25/2021 5:56:51 PM By: Rhae Hammock RN Entered By: Rhae Hammock on 01/19/2021 13:02:52

## 2021-01-26 ENCOUNTER — Encounter: Payer: Self-pay | Admitting: Hematology and Oncology

## 2021-01-26 ENCOUNTER — Other Ambulatory Visit: Payer: Self-pay

## 2021-01-26 ENCOUNTER — Inpatient Hospital Stay (HOSPITAL_BASED_OUTPATIENT_CLINIC_OR_DEPARTMENT_OTHER): Payer: Medicare Other | Admitting: Hematology and Oncology

## 2021-01-26 ENCOUNTER — Inpatient Hospital Stay: Payer: Medicare Other | Attending: Hematology and Oncology

## 2021-01-26 VITALS — BP 150/61 | HR 79 | Temp 97.7°F | Resp 16 | Ht 65.0 in | Wt 117.4 lb

## 2021-01-26 DIAGNOSIS — D649 Anemia, unspecified: Secondary | ICD-10-CM

## 2021-01-26 DIAGNOSIS — D500 Iron deficiency anemia secondary to blood loss (chronic): Secondary | ICD-10-CM | POA: Insufficient documentation

## 2021-01-26 DIAGNOSIS — Z8572 Personal history of non-Hodgkin lymphomas: Secondary | ICD-10-CM | POA: Insufficient documentation

## 2021-01-26 DIAGNOSIS — E538 Deficiency of other specified B group vitamins: Secondary | ICD-10-CM

## 2021-01-26 DIAGNOSIS — D75839 Thrombocytosis, unspecified: Secondary | ICD-10-CM | POA: Diagnosis not present

## 2021-01-26 LAB — CBC WITH DIFFERENTIAL (CANCER CENTER ONLY)
Abs Immature Granulocytes: 0.11 10*3/uL — ABNORMAL HIGH (ref 0.00–0.07)
Basophils Absolute: 0 10*3/uL (ref 0.0–0.1)
Basophils Relative: 0 %
Eosinophils Absolute: 0 10*3/uL (ref 0.0–0.5)
Eosinophils Relative: 0 %
HCT: 30.8 % — ABNORMAL LOW (ref 36.0–46.0)
Hemoglobin: 9.3 g/dL — ABNORMAL LOW (ref 12.0–15.0)
Immature Granulocytes: 1 %
Lymphocytes Relative: 3 %
Lymphs Abs: 0.4 10*3/uL — ABNORMAL LOW (ref 0.7–4.0)
MCH: 30.5 pg (ref 26.0–34.0)
MCHC: 30.2 g/dL (ref 30.0–36.0)
MCV: 101 fL — ABNORMAL HIGH (ref 80.0–100.0)
Monocytes Absolute: 0.4 10*3/uL (ref 0.1–1.0)
Monocytes Relative: 3 %
Neutro Abs: 12.2 10*3/uL — ABNORMAL HIGH (ref 1.7–7.7)
Neutrophils Relative %: 93 %
Platelet Count: 475 10*3/uL — ABNORMAL HIGH (ref 150–400)
RBC: 3.05 MIL/uL — ABNORMAL LOW (ref 3.87–5.11)
RDW: 18.4 % — ABNORMAL HIGH (ref 11.5–15.5)
WBC Count: 13.1 10*3/uL — ABNORMAL HIGH (ref 4.0–10.5)
nRBC: 0 % (ref 0.0–0.2)

## 2021-01-26 LAB — CMP (CANCER CENTER ONLY)
ALT: 9 U/L (ref 0–44)
AST: 16 U/L (ref 15–41)
Albumin: 3.2 g/dL — ABNORMAL LOW (ref 3.5–5.0)
Alkaline Phosphatase: 103 U/L (ref 38–126)
Anion gap: 11 (ref 5–15)
BUN: 20 mg/dL (ref 8–23)
CO2: 29 mmol/L (ref 22–32)
Calcium: 9.5 mg/dL (ref 8.9–10.3)
Chloride: 98 mmol/L (ref 98–111)
Creatinine: 1.03 mg/dL — ABNORMAL HIGH (ref 0.44–1.00)
GFR, Estimated: 58 mL/min — ABNORMAL LOW (ref >60)
Glucose, Bld: 99 mg/dL (ref 70–99)
Potassium: 4.3 mmol/L (ref 3.5–5.1)
Sodium: 138 mmol/L (ref 135–145)
Total Bilirubin: 0.3 mg/dL (ref 0.3–1.2)
Total Protein: 8.3 g/dL — ABNORMAL HIGH (ref 6.5–8.1)

## 2021-01-26 LAB — RETIC PANEL
Immature Retic Fract: 23 % — ABNORMAL HIGH (ref 2.3–15.9)
RBC.: 3.04 MIL/uL — ABNORMAL LOW (ref 3.87–5.11)
Retic Count, Absolute: 73.3 10*3/uL (ref 19.0–186.0)
Retic Ct Pct: 2.4 % (ref 0.4–3.1)
Reticulocyte Hemoglobin: 32.3 pg (ref >27.9)

## 2021-01-26 LAB — C-REACTIVE PROTEIN: CRP: 2.6 mg/dL — ABNORMAL HIGH (ref <1.0)

## 2021-01-26 LAB — SEDIMENTATION RATE: Sed Rate: >140 mm/hr — ABNORMAL HIGH (ref 0–22)

## 2021-01-26 LAB — FOLATE: Folate: 94.9 ng/mL (ref >5.9)

## 2021-01-26 LAB — VITAMIN B12: Vitamin B-12: 865 pg/mL (ref 180–914)

## 2021-01-26 LAB — LACTATE DEHYDROGENASE: LDH: 279 U/L — ABNORMAL HIGH (ref 98–192)

## 2021-01-26 NOTE — Progress Notes (Signed)
LPN followed up with IR. Patient scheduled for Tuesday 01/31/21 at 9 am. Patient to arrive at Radiology department at 8:45am. Patient notified of appointment date and time. Patient accepts appointment.  [name] [name]

## 2021-01-26 NOTE — Progress Notes (Signed)
[place] Telephone:(336) [phone]   Fax:(336) [place] NOTE  Patient Care Team: [name], MD as PCP - General (Internal Medicine) Bensimhon, Shaune Pascal, MD as PCP - Cardiology (Cardiology) [name], MD (Inactive) as Consulting Physician (Gastroenterology) Bensimhon, Shaune Pascal, MD as Consulting Physician (Cardiology) Collene Gobble, MD as Consulting Physician (Pulmonary Disease) Hennie Duos, MD as Consulting Physician (Rheumatology) Marchia Bond, MD as Consulting Physician (Orthopedic Surgery)  Hematological/Oncological History #Normocytic Anemia #Thrombocytosis 07/22/2017: WBC 7.5 , Hgb 12.0, MCV 100.4, Plt 323 12/02/2018: WBC 5.2, Hgb 10.1, MCV 98, Plt 348 11/18/2019: WBC 7.4, Hgb 9.6, MCV 100, Plt 331 01/10/2021: WBC 9.3, Hgb 8.6, MCV 95.1, Plt 473 01/24/2021: WBC 12.4, Hgb 9.2, MCV 94.4, Plt 461 01/26/2021: establish care with Dr. Lorenso Courier   CHIEF COMPLAINTS/PURPOSE OF CONSULTATION:  "Normocytic Anemia "  HISTORY OF PRESENTING ILLNESS:  [name] [name] 70 y.o. female with medical history significant for HTN, hypothyroidism, pulmonary artery hypertension on oxygen, and non-Hodgkin lymphoma who presents for evaluation of anemia/thrombocytosis.   On review of the previous records [name] [name] had normal hemoglobin on 07/22/2017 at a hemoglobin of 12.0.  She had a steady decline in her hemoglobin starting next year.  She had a hemoglobin of 10.1 on 12/02/2018, hemoglobin of 9.6 on 11/08/2019, hemoglobin of 8.6 on 01/10/2021.  And most recently hemoglobin 9.2 on 01/24/2021.  Of note she did have a hospitalization on 11/25/2019 at which time her hemoglobin dropped to 5.9 with an MCV of 107.8.  She received 2 PRBC transfusions and hemoglobin subsequently prompt up to 9.5 on 11/25/2019.  On exam today [name] [name] reports that she has some recurring sores on her legs that have been failing to improve.  She notes that the largest wound is on her right  leg, but that is currently covered.  She reports it does periodically weep and occasionally does drain with infection.  She has been struggling with these sores on her legs for approximately 9 years time.  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  On further discussion she denies having any overt signs of bleeding, bruising, or dark stools.  Reports that she is currently taking p.o. iron and eats an unrestricted diet.  She has been on vitamin B12 1000 mcg daily and continues this.  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  She currently denies any fevers, chills, sweats, nausea, vomiting or diarrhea.  She does endorse fatigue.  A full 10 point ROS is listed below  MEDICAL HISTORY:  Past Medical History:  Diagnosis Date  . Anemia   . Arthritis   . DOE (dyspnea on exertion)   . Epistaxis   . History of chicken pox   . Hypertension   . Hypothyroidism   . Leg ulcer ([place])   . Melanoma ([place]) 1999  . 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[place])   . Non Hodgkin's lymphoma ([place])    Treated with chemo 2010, felt to be cured.  Marland Kitchen PAH (pulmonary artery hypertension) ([place])   . Peripheral arterial occlusive disease ([place])   . Pulmonary hypertension ([place])   . Requires supplemental oxygen    2 liters-3liters when moving  . 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  ABDOMINAL AORTOGRAM W/LOWER EXTREMITY Bilateral 10/04/2020   Procedure: ABDOMINAL AORTOGRAM W/LOWER EXTREMITY;  Surgeon: Serafina Mitchell, MD;  Location: [place] [facility];  Service: Cardiovascular;  Laterality: Bilateral;  . ABDOMINAL AORTOGRAM W/LOWER EXTREMITY Bilateral 01/02/2021   Procedure: ABDOMINAL AORTOGRAM W/LOWER EXTREMITY;  Surgeon: Waynetta Sandy, MD;  Location: [place] [facility];  Service: Cardiovascular;  Laterality: Bilateral;  . AMPUTATION Right 10/24/2017   Procedure: RAY resection right index;  Surgeon: Daryll Brod, MD;  Location: [place];  Service: Orthopedics;  Laterality: Right;  Axillary block  . AMPUTATION Right 11/21/2018   Procedure: AMPUTATION RAY RIGHT MIDDLE FINGER;  Surgeon: Daryll Brod, MD;  Location: [place];  Service: Orthopedics;  Laterality: Right;  . AMPUTATION Right 10/22/2019   Procedure: AMPUTATION DIGIT RIGHT RING AND SMALL FINGER, DIGITAL SYMPATHECTOMY RIGHT RADIAL ULNAR ARCH;  Surgeon: Daryll Brod, MD;  Location: [place];  Service: Orthopedics;  Laterality: Right;  AXILARY  . apligraph  12-04-2102,   12-18-2012   bilateral calves   [name]  . biopsy on cerebellum    . COLONOSCOPY WITH PROPOFOL Left 10/07/2015   Procedure: COLONOSCOPY WITH PROPOFOL;  Surgeon: [name], MD;  Location: [place];  Service: Endoscopy;  Laterality: Left;  . ENDARTERECTOMY FEMORAL Right 09/14/2016   Procedure: ENDARTERECTOMY FEMORAL RIGHT;  Surgeon: [name], MD;  Location: [facility] OR;  Service: Vascular;  Laterality: Right;  . ENDOVENOUS ABLATION SAPHENOUS VEIN W/ LASER  07-24-2012   right greater saphenous vein by [name], M.D.   . ENDOVENOUS ABLATION SAPHENOUS VEIN W/ LASER  10-16-2012   left greater saphenous vein  by Dr. [name]  . ENDOVENOUS ABLATION SAPHENOUS VEIN W/ LASER Right 09-01-2015   endovenous laser ablation right greater saphenous vein by [name] MD  . ESOPHAGOGASTRODUODENOSCOPY N/A 05/07/2016   Procedure: ESOPHAGOGASTRODUODENOSCOPY (EGD);  Surgeon: [name], MD;  Location: [facility] ENDOSCOPY;  Service: Endoscopy;  Laterality: N/A;  . ESOPHAGOGASTRODUODENOSCOPY N/A 11/23/2020   Procedure: ESOPHAGOGASTRODUODENOSCOPY (EGD);  Surgeon: [name], MD;  Location: [facility];  Service: Endoscopy;  Laterality: N/A;  . ESOPHAGOGASTRODUODENOSCOPY (EGD) WITH PROPOFOL Left 10/07/2015   Procedure:  ESOPHAGOGASTRODUODENOSCOPY (EGD) WITH PROPOFOL;  Surgeon: [name], MD;  Location: [place];  Service: Endoscopy;  Laterality: Left;  . GIVENS CAPSULE STUDY N/A 07/31/2016   Procedure: GIVENS CAPSULE STUDY;  Surgeon: [name], MD;  Location: [facility] ENDOSCOPY;  Service: Endoscopy;  Laterality: N/A;  . GIVENS CAPSULE STUDY N/A 11/23/2020   Procedure: GIVENS CAPSULE STUDY;  Surgeon: [name], MD;  Location: [facility] ENDOSCOPY;  Service: Endoscopy;  Laterality: N/A;  . IR IVC FILTER PLMT / S&I Burke Keels GUID/MOD SED  11/22/2020  . LOWER EXTREMITY ANGIOGRAPHY N/A 10/25/2020   Procedure: LOWER EXTREMITY ANGIOGRAPHY - Left;  Surgeon: Serafina Mitchell, MD;  Location: [place] [facility];  Service: Cardiovascular;  Laterality: N/A;  . melonoma removes Right    behind right knee  . OPEN REDUCTION INTERNAL FIXATION (ORIF) DISTAL RADIAL FRACTURE Left 11/24/2019   Procedure: OPEN REDUCTION INTERNAL FIXATION (ORIF) DISTAL RADIAL FRACTURE;  Surgeon: Altamese Guthrie, MD;  Location: [place];  Service: Orthopedics;  Laterality: Left;  . PATCH ANGIOPLASTY Right 09/14/2016   Procedure: PATCH ANGIOPLASTY RIGHT FEMORAL ARTERY USING HEMASHIELD PLATINUM FINESSE PATCH;  Surgeon: [name], MD;  Location: [place];  Service: Vascular;  Laterality: Right;  . PERIPHERAL VASCULAR BALLOON ANGIOPLASTY Right 10/04/2020   Procedure: PERIPHERAL VASCULAR BALLOON ANGIOPLASTY;  Surgeon: Serafina Mitchell, MD;  Location: [facility]  IN[facility];  Service: Cardiovascular;  Laterality: Right;  SFA/PT/AT/PTtrunk  . PERIPHERAL VASCULAR BALLOON ANGIOPLASTY Left 10/25/2020   Procedure: PERIPHERAL VASCULAR BALLOON ANGIOPLASTY;  Surgeon: Serafina Mitchell, MD;  Location: [place] [facility];  Service: Cardiovascular;  Laterality: Left;  anterior tibial and tibioperoneal trunk  . PERIPHERAL VASCULAR CATHETERIZATION N/A 09/07/2016   Procedure: Abdominal Aortogram w/Lower Extremity;  Surgeon: [name], MD;  Location: [place] [facility];  Service:  Cardiovascular;  Laterality: N/A;  . RIGHT HEART CATH N/A 04/19/2017   Procedure: Right Heart Cath;  Surgeon: [name], MD;  Location: [place] [facility];  Service: Cardiovascular;  Laterality: N/A;  . RIGHT HEART CATHETERIZATION N/A 11/14/2012   Procedure: RIGHT HEART CATH;  Surgeon: [name], MD;  Location: [facility] CATH LAB;  Service: Cardiovascular;  Laterality: N/A;  . rt little toe removal  10/1998  . stent left thigh  3/11  . TENDON TRANSFER Right 10/24/2017   Procedure: transfer extensor indecus propius/extensor digitorum commomus index to middle ring and small;  Surgeon: Daryll Brod, MD;  Location: [place];  Service: Orthopedics;  Laterality: Right;  . TONSILLECTOMY    . TONSILLECTOMY AND ADENOIDECTOMY    . TUNNELED VENOUS CATHETER PLACEMENT  01/2013  . ULNAR HEAD EXCISION Right 10/24/2017   Procedure: darrach right ulna;  Surgeon: Daryll Brod, MD;  Location: [place];  Service: Orthopedics;  Laterality: Right;  . VEIN LIGATION AND STRIPPING  05/16/2012   Procedure: VEIN LIGATION AND STRIPPING;  Surgeon: [name], MD;  Location: [facility] OR;  Service: Vascular;  Laterality: Right;  Irrigation and Debridement right lower leg, ligation of saphenous vein.    SOCIAL HISTORY: Social History   Socioeconomic History  . Marital status: Married    Spouse name: Not on file  . Number of children: 1  . Years of education: Not on file  . Highest education level: Not on file  Occupational History  . Occupation: homemaker  Tobacco Use  . Smoking status: Never Smoker  . Smokeless tobacco: Never Used  Vaping Use  . Vaping Use: Never used  Substance and Sexual Activity  . Alcohol use: Yes    Alcohol/week: 0.0 standard drinks    Comment: 1-2 drinks per day wine.liquor or beer  . Drug use: No  . Sexual activity: Not Currently  Other Topics Concern  . Not on file  Social History Narrative  . Not on file   Social Determinants of Health   Financial Resource Strain: Not on file   Food Insecurity: Not on file  Transportation Needs: Not on file  Physical Activity: Not on file  Stress: Not on file  Social Connections: Not on file  Intimate Partner Violence: Not on file    FAMILY HISTORY: Family History  Problem Relation Age of Onset  . Lupus Father   . Lymphoma Father   . Hyperlipidemia Mother   . Cancer Daughter        sarcoma  . Colon cancer Other     ALLERGIES:  is allergic to levofloxacin, sulfa antibiotics, doxycycline hyclate, and penicillins.  MEDICATIONS:  Current Outpatient Medications  Medication Sig Dispense Refill  . Biotin 1 MG CAPS 1 tablet    . Calcium Carb-Cholecalciferol 1000-800 MG-UNIT TABS 2 tablets    . Cholecalciferol (VITAMIN D) 2000 units CAPS Take 2,000 Units by mouth at bedtime.    . citalopram (CELEXA) 40 MG tablet TAKE 1 TABLET BY MOUTH EVERY DAY 30 tablet 3  . clopidogrel (  PLAVIX) 75 MG tablet TAKE 1 TABLET BY MOUTH EVERY DAY 90 tablet 3  . cycloSPORINE (RESTASIS) 0.05 % ophthalmic emulsion 1 drop into affected eye    . FERREX 150 150 MG capsule TAKE 1 CAPSULE BY MOUTH TWICE A DAY 180 capsule 1  . fish oil-omega-3 fatty acids 1000 MG capsule Take 1,000 mg by mouth 2 (two) times daily.    . folic acid (FOLVITE) 1 MG tablet TAKE 1 TABLET BY MOUTH EVERY DAY 90 tablet 1  . HYDROcodone-acetaminophen (NORCO) 5-325 MG tablet One tab by mouth with food sparingly up to 3 times daily as needed for leg wound pain 15 tablet 0  . levothyroxine (SYNTHROID) 50 MCG tablet TAKE 1 TABLET BY MOUTH DAILY BEFORE BREAKFAST (Patient taking differently: Take 50 mcg by mouth daily before breakfast.) 90 tablet 1  . loperamide (IMODIUM) 2 MG capsule Take 2 mg by mouth daily as needed for diarrhea or loose stools.     Marland Kitchen MAGNESIUM ASPARTATE PO Take 150 mg by mouth in the morning and at bedtime.    . Multiple Vitamin (MULTIVITAMIN ADULT PO) 2 tablets    . OPSUMIT 10 MG tablet Take 1 tablet (75m) by mouth daily. (Patient taking differently: Take 10 mg by  mouth daily.) 30 tablet 11  . OXYGEN Inhale 2-3 L into the lungs continuous.     . pantoprazole (PROTONIX) 40 MG tablet Take 1 tablet (40 mg total) by mouth 2 (two) times daily. 180 tablet 3  . potassium chloride 20 MEQ/15ML (10%) SOLN TAKE 2 TEASPOONFUL (DILUTED IN WATER) ONCE DAILY (Patient taking differently: Take 20 mEq by mouth daily.) 473 mL 11  . predniSONE (DELTASONE) 5 MG tablet Take 5 mg by mouth daily.    . rosuvastatin (CRESTOR) 5 MG tablet Take 1 tablet every Monday, Wednesday, and Friday. (Patient taking differently: Take 5 mg by mouth every Monday, Wednesday, and Friday.) 45 tablet 3  . SANTYL ointment Apply 1 application topically every other day.    . sildenafil (REVATIO) 20 MG tablet TAKE 3 TABLETS (60 MG TOTAL) BY MOUTH 3 (THREE) TIMES DAILY. PLEASE CALL FOR OFFICE VISIT [phone](Patient taking differently: Take 60 mg by mouth 3 (three) times daily.) 810 tablet 1  . spironolactone (ALDACTONE) 25 MG tablet TAKE 1 TABLET BY MOUTH EVERY DAY 90 tablet 0  . torsemide (DEMADEX) 20 MG tablet Take 20-40 mg by mouth See admin instructions. Takes 40 mg in the morning and 20 mg at night    . traMADol (ULTRAM) 50 MG tablet Take 50 mg by mouth every 6 (six) hours as needed for moderate pain or severe pain.    . valACYclovir (VALTREX) 500 MG tablet Take 1 tablet (500 mg total) by mouth 2 (two) times daily. (Patient taking differently: Take 500 mg by mouth daily as needed (lip sore).) 10 tablet 0  . vitamin B-12 (CYANOCOBALAMIN) 1000 MCG tablet Take 1,000 mcg by mouth daily.     No current facility-administered medications for this visit.    REVIEW OF SYSTEMS:   Constitutional: ( - ) fevers, ( - )  chills , ( - ) night sweats Eyes: ( - ) blurriness of vision, ( - ) double vision, ( - ) watery eyes Ears, nose, mouth, throat, and face: ( - ) mucositis, ( - ) sore throat Respiratory: ( - ) cough, ( - ) dyspnea, ( - ) wheezes Cardiovascular: ( - ) palpitation, ( - ) chest discomfort, ( - )  lower extremity swelling Gastrointestinal:  ( - )  nausea, ( - ) heartburn, ( - ) change in bowel habits Skin: ( - ) abnormal skin rashes Lymphatics: ( - ) new lymphadenopathy, ( - ) easy bruising Neurological: ( - ) numbness, ( - ) tingling, ( - ) new weaknesses Behavioral/Psych: ( - ) mood change, ( - ) new changes  All other systems were reviewed with the patient and are negative.  PHYSICAL EXAMINATION: ECOG PERFORMANCE STATUS: 3 - Symptomatic, >50% confined to bed  Vitals:   01/26/21 1414  BP: (!) 150/61  Pulse: 79  Resp: 16  Temp: 97.7 F (36.5 C)   Filed Weights   01/26/21 1414  Weight: 117 lb 6.4 oz (53.3 kg)    GENERAL: chronically ill appearing elderly Caucasian female in NAD  SKIN:  Dry skin on left hand, dry gangrene of right hand. Wounds on legs covered bilaterally  EYES: conjunctiva are pink and non-injected, sclera clear LUNGS: clear to auscultation and percussion with normal breathing effort HEART: regular rate & rhythm and no murmurs and no lower extremity edema Musculoskeletal: no cyanosis of digits and no clubbing  PSYCH: alert & oriented x 3, fluent speech NEURO: no focal motor/sensory deficits  LABORATORY DATA:  I have reviewed the data as listed CBC Latest Ref Rng & Units 01/26/2021 01/24/2021 01/10/2021  WBC 4.0 - 10.5 K/uL 13.1(H) 12.4(H) 9.3  Hemoglobin 12.0 - 15.0 g/dL 9.3(L) 9.2(L) 8.6(L)  Hematocrit 36.0 - 46.0 % 30.8(L) 28.5(L) 27.3(L)  Platelets 150 - 400 K/uL 475(H) 461(H) 473(H)    CMP Latest Ref Rng & Units 01/24/2021 01/10/2021 01/02/2021  Glucose 65 - 99 mg/dL 103(H) 81 91  BUN 7 - 25 mg/dL 22 34(H) 30(H)  Creatinine 0.60 - 0.93 mg/dL 1.04(H) 1.25(H) 1.50(H)  Sodium 135 - 146 mmol/L 137 139 137  Potassium 3.5 - 5.3 mmol/L 4.7 3.7 3.4(L)  Chloride 98 - 110 mmol/L 98 99 97(L)  CO2 20 - 32 mmol/L 26 29 -  Calcium 8.6 - 10.4 mg/dL 8.5(L) 9.1 -  Total Protein 6.1 - 8.1 g/dL - 6.5 -  Total Bilirubin 0.2 - 1.2 mg/dL - 0.4 -  Alkaline Phos 38 -  126 U/L - - -  AST 10 - 35 U/L - 18 -  ALT 6 - 29 U/L - 10 -    RADIOGRAPHIC STUDIES: PERIPHERAL VASCULAR CATHETERIZATION  Result Date: 01/02/2021 Patient name: ALYNA STENSLAND MRN: [id number] DOB: 1951/02/04 Sex: female 01/02/2021 Pre-operative Diagnosis: Bilateral lower extremity wounds, PAD Post-operative diagnosis:  Same Surgeon:  Eda Paschal. Donzetta Matters, MD Procedure Performed: 1.  Ultrasound-guided cannulation left common femoral artery 2.  Aortogram 3.  Selection of right common femoral artery and right lower extremity angiogram 4.  Left lower extremity angiogram 5.  Moderate sedation with fentanyl and Versed for 21 minutes Indications: 69 year old female with previous bilateral lower extremity endovascular invention.  She now has persistent wounds on her bilateral legs and is indicated for angiography with possible intervention. Findings: The aorta and iliac segments were calcified.  She appears to have bilateral common femoral treatments in the past.  The right side she is heavily calcified she has 1 area of approximately 50% stenosis in the SFA is nonflow limiting.  She has dominant runoff via the arterial tibial and peroneal arteries.  On the left side she also has heavily calcified SFA no flow-limiting stenosis in the popliteal artery.  She then has runoff via the anterior tibial and posterior tibial arteries without flow-limiting stenosis.  No intervention was undertaken. Patient will need continue  wound care  Procedure:  The patient was identified in the holding area and taken to room 8.  The patient was then placed supine on the table and prepped and draped in the usual sterile fashion.  A time out was called.  Ultrasound was used to evaluate the left common femoral artery.  The artery was noted to be patent.  To have a previous patch angioplasty but did have calcium higher up.  The area was anesthetized 1% lidocaine.  Moderate sedation was administered with fentanyl Versed and a nurse monitored her vital  signs throughout the course of the case.  The artery was cannulated with direct ultrasound visualization a micropuncture needle followed by wire sheath.  Images saved the permanent record.  Bentson wires placed followed by 5 [place] sheath.  Omni catheter was placed to the level of L1 and aortogram performed.  We then crossed the bifurcation to the common femoral artery on the right with Bentson wire and Omni catheter.  Wire was removed right lower extremity angiography performed with the above findings.  We then remove the catheter over wire.  Left lower extremity angiogram was performed via retrograde sheath angiogram.  Sheath will be pulled in postoperative holding.  She tolerated procedure without any complication. Contrast: 90cc Brandon C. Donzetta Matters, MD [facility] Office: [phone] Pager: [phone]  VAS [place] LOWER EXTREMITY VENOUS (DVT)  Result Date: 01/16/2021  Lower Venous DVT Study Indications: Pain, and Follow up previous thrombus.  Risk Factors: DVT Prior DVT per duplex 11/07/2020. Anticoagulation: Plavix. Limitations: Open wound and bandages. Comparison Study: Prior duplex 11/07/2020 showed distal right femoral and                   popliteal thrombus. Performing Technologist: Delorise Shiner RVT  Examination Guidelines: A complete evaluation includes B-mode imaging, spectral Doppler, color Doppler, and power Doppler as needed of all accessible portions of each vessel. Bilateral testing is considered an integral part of a complete examination. Limited examinations for reoccurring indications may be performed as noted. The reflux portion of the exam is performed with the patient in reverse Trendelenburg.  +---------+---------------+---------+-----------+---------------+-------------+ RIGHT    CompressibilityPhasicitySpontaneityProperties     Thrombus                                                                 Aging          +---------+---------------+---------+-----------+---------------+-------------+ CFV      Full           Yes      Yes                                     +---------+---------------+---------+-----------+---------------+-------------+ SFJ      Full           Yes      Yes                                     +---------+---------------+---------+-----------+---------------+-------------+ FV Prox  Full           Yes      Yes                                     +---------+---------------+---------+-----------+---------------+-------------+  FV Mid   Full           Yes      Yes                                     +---------+---------------+---------+-----------+---------------+-------------+ FV DistalPartial        Yes      Yes        brightly       Chronic                                                   echogenic                    +---------+---------------+---------+-----------+---------------+-------------+ PFV      Full           Yes                                              +---------+---------------+---------+-----------+---------------+-------------+ POP      Full           Yes      Yes                                     +---------+---------------+---------+-----------+---------------+-------------+ GSV      Full           Yes      Yes                                     +---------+---------------+---------+-----------+---------------+-------------+ SSV      Full                                                            +---------+---------------+---------+-----------+---------------+-------------+ Previously documented thrombus was identified in the distal femoral vein, where it now appears to be chronic and recanalized. Deep venous reflux is seen bilaterally. Limited imaging in the right calf secondary to bandages and open wounds.  +----+---------------+---------+-----------+----------+--------------+  LEFTCompressibilityPhasicitySpontaneityPropertiesThrombus Aging +----+---------------+---------+-----------+----------+--------------+ CFV Full           Yes      Yes                                 +----+---------------+---------+-----------+----------+--------------+    Findings reported to Dr. Dellia Nims at 15:23.  Summary: RIGHT: - There is recanalized thrombus in the right femoral vein(s). - Findings consistent with chronic deep vein thrombosis involving the right femoral vein. - Findings appear improved from previous examination. - There is no evidence of superficial venous thrombosis.  - No cystic structure found in the popliteal fossa.  LEFT: - No evidence of common femoral vein obstruction.  *See table(s) above for measurements and observations. Electronically signed by Servando Snare MD on 01/16/2021 at 7:19:56 AM.  Final     ASSESSMENT & PLAN [name] [name] 70 y.o. female with medical history significant for HTN, hypothyroidism, pulmonary artery hypertension on oxygen, and non-Hodgkin lymphoma who presents for evaluation of anemia/thrombocytosis.   After review the labs, review the records, schedule the patient the findings are most consistent with an anemia of chronic disease.  It is most likely the inflammation from her wounds and rheumatological condition are causing the findings we seen her iron panel.  Her iron binding capacity is quite low with low iron sat and moderately elevated ferritin.  In this case I do not believe the patient is able to properly absorb iron and may have bone marrow suppression.  I would recommend that we test labs in order to confirm this with an ESR, CRP, iron panel, ferritin, and erythropoietin level.  I also recommend we proceed with IV Feraheme 510 mg q. 7 days x 2 doses.  This will help to bolster the patient's iron levels which do appear to be low in the setting of the patient's anemia chronic disease.  In the event that this is unsuccessful and that  treatment of her leg wounds with decreasing inflammation do not cause improvement in her blood counts we would need to consider bone marrow biopsy.  The patient voiced understanding of this plan moving forward.  #Normocytic Anemia #Thrombocytosis --the differential for this patient's findings are quite broad --findings are concerning for anemia of inflammation coupled with iron deficiency anemia and possibly other nutritional deficiencies --will order iron panel, ferritin, Vitamin b12, folate, MMA, and homocysteine. Can order copper as well --sources of inflammation include the patient's scleroderma and current right leg/foot wound --will order ESR, CRP, and erythropoeitin as well as a reticulocyte panel.   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 --RTC 2-4 weeks after dose of IV iron   Orders Placed This Encounter  Procedures  . CBC with Differential (Cancer Center Only)    Standing Status:   Future    Number of Occurrences:   1    Standing Expiration Date:   01/26/2022  . Retic Panel    Standing Status:   Future    Number of Occurrences:   1    Standing Expiration Date:   01/26/2022  . Lactate dehydrogenase (LDH)    Standing Status:   Future    Number of Occurrences:   1    Standing Expiration Date:   01/26/2022  . CMP ([facility]    Standing Status:   Future    Number of Occurrences:   1    Standing Expiration Date:   01/26/2022  . Iron and TIBC    Standing Status:   Future    Number of Occurrences:   1    Standing Expiration Date:   01/26/2022  . Ferritin    Standing Status:   Future    Number of Occurrences:   1    Standing Expiration Date:   01/26/2022  . Sedimentation rate    Standing  Status:   Future    Number of Occurrences:   1    Standing Expiration Date:   01/26/2022  . C-reactive protein  Standing Status:   Future    Number of Occurrences:   1    Standing Expiration Date:   01/26/2022  . Vitamin B12    Standing Status:   Future    Number of Occurrences:   1    Standing Expiration Date:   01/26/2022  . Folate, Serum    Standing Status:   Future    Number of Occurrences:   1    Standing Expiration Date:   01/26/2022  . Erythropoietin    Standing Status:   Future    Number of Occurrences:   1    Standing Expiration Date:   01/26/2022   All questions were answered. The patient knows to call the clinic with any problems, questions or concerns.  A total of more than 60 minutes were spent on this encounter and over half of that time was spent on counseling and coordination of care as outlined above.   Ledell Peoples, MD Department of Hematology/Oncology [place] [facility] Phone: [phone] Pager: [phone] Email: [email or web address]  01/26/2021 3:35 PM

## 2021-01-26 NOTE — Progress Notes (Signed)
JEVON, LITTLEPAGE ([id number]) Visit Report for 01/23/2021 Arrival Information Details Patient Name: Date of Service: [name] 01/23/2021 10:30 A M Medical Record Number: [id number] Patient Account Number: [id number] Date of Birth/Sex: Treating RN: March 08, 1951 (70 y.o. [name], [name] Primary Care Julyan Gales: [name] Other Clinician: Referring Intisar Claudio: Treating Sergei Delo/Extender: [name] in Treatment: 49 Visit Information History Since Last Visit Added or deleted any medications: No Patient Arrived: Wheel Chair Any new allergies or adverse reactions: No Arrival Time: 10:54 Had a fall or experienced change in No Accompanied By: husband activities of daily living that may affect Transfer Assistance: None risk of falls: Patient Identification Verified: Yes Signs or symptoms of abuse/neglect since last visito No Secondary Verification Process Completed: Yes Hospitalized since last visit: No Patient Requires Transmission-Based Precautions: No Implantable device outside of the clinic excluding No Patient Has Alerts: Yes cellular tissue based products placed in the center Patient Alerts: R ABI: [place] TBI: 0.41 since last visit: L ABI: [place] (07/2020) Has Dressing in Place as Prescribed: Yes Pain Present Now: Yes Electronic Signature(s) Signed: 01/25/2021 5:56:51 PM By: Rhae Hammock RN Entered By: Rhae Hammock on 01/23/2021 10:55:08 -------------------------------------------------------------------------------- Clinic Level of Care Assessment Details Patient Name: Date of Service: [name], Yolanda Manges. 01/23/2021 10:30 A M Medical Record Number: [id number] Patient Account Number: [id number] Date of Birth/Sex: Treating RN: 04-01-1951 (70 y.o. [name] Primary Care Jamelle Goldston: [name] Other Clinician: Referring Racer Quam: Treating Rukaya Kleinschmidt/Extender: [name] in Treatment: 40 Clinic Level of Care  Assessment Items TOOL 4 Quantity Score _0  - 0 Use when only an EandM is performed on FOLLOW-UP visit ASSESSMENTS - Nursing Assessment / Reassessment X- 1 10 Reassessment of Co-morbidities (includes updates in patient status) X- 1 5 Reassessment of Adherence to Treatment Plan ASSESSMENTS - Wound and Skin A ssessment / Reassessment _1  - 0 Simple Wound Assessment / Reassessment - one wound X- 13 5 Complex Wound Assessment / Reassessment - multiple wounds _2  - 0 Dermatologic / Skin Assessment (not related to wound area) ASSESSMENTS - Focused Assessment X- 1 5 Circumferential Edema Measurements - multi extremities _3  - 0 Nutritional Assessment / Counseling / Intervention X- 1 5 Lower Extremity Assessment (monofilament, tuning fork, pulses) _4  - 0 Peripheral Arterial Disease Assessment (using hand held doppler) ASSESSMENTS - Ostomy and/or Continence Assessment and Care _5  - 0 Incontinence Assessment and Management _6  - 0 Ostomy Care Assessment and Management (repouching, etc.) PROCESS - Coordination of Care X - Simple Patient / Family Education for ongoing care 1 15 _7  - 0 Complex (extensive) Patient / Family Education for ongoing care X- 1 10 Staff obtains Programmer, systems, Records, T Results / Process Orders est _8  - 0 Staff telephones HHA, Nursing Homes / Clarify orders / etc _9  - 0 Routine Transfer to another Facility (non-emergent condition) _10  - 0 Routine Hospital Admission (non-emergent condition) _11  - 0 New Admissions / Biomedical engineer / Ordering NPWT Apligraf, etc. , _12  - 0 Emergency Hospital Admission (emergent condition) X- 1 10 Simple Discharge Coordination _13  - 0 Complex (extensive) Discharge Coordination PROCESS - Special Needs _14  - 0 Pediatric / Minor Patient Management _15  - 0 Isolation Patient Management _16  - 0 Hearing / Language / Visual special needs _17  - 0 Assessment of Community assistance (transportation, D/C planning, etc.) _18  -  0 Additional assistance / Altered mentation _19  - 0 Support Surface(s) Assessment (bed, cushion, seat, etc.) INTERVENTIONS - Wound Cleansing / Measurement _20  -  0 Simple Wound Cleansing - one wound X- 13 5 Complex Wound Cleansing - multiple wounds X- 1 5 Wound Imaging (photographs - any number of wounds) _0  - 0 Wound Tracing (instead of photographs) _1  - 0 Simple Wound Measurement - one wound X- 13 5 Complex Wound Measurement - multiple wounds INTERVENTIONS - Wound Dressings _2  - 0 Small Wound Dressing one or multiple wounds _3  - 0 Medium Wound Dressing one or multiple wounds X- 1 20 Large Wound Dressing one or multiple wounds X- 1 5 Application of Medications - topical <[id number]>_2<\/[id number]>_4  - 0 Application of Medications - injection INTERVENTIONS - Miscellaneous _5  - 0 External ear exam _6  - 0 Specimen Collection (cultures, biopsies, blood, body fluids, etc.) _7  - 0 Specimen(s) / Culture(s) sent or taken to Lab for analysis _8  - 0 Patient Transfer (multiple staff / Civil Service fast streamer / Similar devices) _9  - 0 Simple Staple / Suture removal (25 or less) _10  - 0 Complex Staple / Suture removal (26 or more) _11  - 0 Hypo / Hyperglycemic Management (close monitor of Blood Glucose) _12  - 0 Ankle / Brachial Index (ABI) - do not check if billed separately X- 1 5 Vital Signs Has the patient been seen at the hospital within the last three years: Yes Total Score: 290 Level Of Care: New/Established - Level 5 Electronic Signature(s) Signed: 01/23/2021 6:10:15 PM By: Baruch Gouty RN, BSN Entered By: Baruch Gouty on 01/23/2021 11:54:08 -------------------------------------------------------------------------------- Encounter Discharge Information Details Patient Name: Date of Service: [name], [name] 01/23/2021 10:30 A M Medical Record Number: [id number] Patient Account Number: [id number] Date of Birth/Sex: Treating RN: 04/20/1951 (70 y.o. [name] Primary Care Raife Lizer: [name]  Other Clinician: Referring Nameer Summer: Treating Ernestyne Caldwell/Extender: [name] in Treatment: [phone] Encounter Discharge Information Items Discharge Condition: Stable Ambulatory Status: Wheelchair Discharge Destination: Home Transportation: Private Auto Accompanied By: self Schedule Follow-up Appointment: Yes Clinical Summary of Care: Electronic Signature(s) Signed: 01/23/2021 6:39:14 PM By: Deon Pilling Entered By: Deon Pilling on 01/23/2021 13:08:19 -------------------------------------------------------------------------------- Lower Extremity Assessment Details Patient Name: Date of Service: [name] 01/23/2021 10:30 A M Medical Record Number: [id number] Patient Account Number: [id number] Date of Birth/Sex: Treating RN: 1951/06/27 (70 y.o. [name], [name] Primary Care Marcy Sookdeo: [name] Other Clinician: Referring Murna Backer: Treating Manmeet Arzola/Extender: [name] in Treatment: 461 Edema Assessment Assessed: [name]: No] [name]: No] Edema: [Left: Yes] [Right: Yes] Calf Left: Right: Point of Measurement: 36 cm From Medial Instep 34 cm 36 cm Ankle Left: Right: Point of Measurement: 10 cm From Medial Instep 22.5 cm 22 cm Vascular Assessment Pulses: Dorsalis Pedis Palpable: [Left:Yes] [Right:Yes] Posterior Tibial Palpable: [Left:Yes] [Right:Yes] Electronic Signature(s) Signed: 01/25/2021 5:56:51 PM By: Rhae Hammock RN Entered By: Rhae Hammock on 01/23/2021 11:13:27 -------------------------------------------------------------------------------- Multi Wound Chart Details Patient Name: Date of Service: [name] 01/23/2021 10:30 A M Medical Record Number: [id number] Patient Account Number: [id number] Date of Birth/Sex: Treating RN: [birth year] (70 y.o. 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] Primary Etiology: [16:Anemia, Hypertension, Peripheral] [19:Anemia, Hypertension, Peripheral] [  20:Anemia, Hypertension, Peripheral] Comorbid History: [16:Arterial Disease, Peripheral Venous Arterial Disease, Peripheral Venous Disease, Raynauds, Scleroderma, Rheumatoid Arthritis, Osteoarthritis Rheumatoid Arthritis, Osteoarthritis 01/04/2020] [19:Disease, Raynauds, Scleroderma,  05/02/2020] [20:Arterial Disease, Peripheral Venous Disease, Raynauds, Scleroderma, Rheumatoid Arthritis, Osteoarthritis 05/02/2020] Date Acquired: [16:55] [19:38] [20:38] Weeks of Treatment: [16:Open] [19:Open] [20:Open] Wound Status: [16:No] [19:No] [20:No] Clustered Wound: [16:0.5x0.9x0.4] [19:2.8x0.5x0.1] [20:0.9x0.6x0.2] Measurements L x W x D (cm) [16:0.353] [19:1.1] [20:0.424] A (cm) : rea [16:0.141] [19:0.11] [20:0.085] Volume (cm) : [16:97.20%] [19:-680.10%] [20:3.60%] % Reduction in Area: [16:88.80%] [19:-292.90%] [20:3.40%] % Reduction in Volume: [16:Category/Stage III] [19:Full Thickness Without Exposed] [20:Full Thickness Without Exposed] Classification: [16:Large] [19:Support Structures Large] [20:Support Structures Large] Exudate A mount: [16:Serosanguineous] [19:Purulent] [20:Serosanguineous] Exudate Type: [16:red, brown] [19:yellow, brown, green] [20:red, brown] Exudate Color: [16:Distinct, outline attached] [19:Distinct, outline attached] [20:Distinct, outline attached] Wound Margin: [16:None  Present (0%)] [19:None Present (0%)] [20:None Present (0%)] Granulation Amount: [16:Large (67-100%)] [19:Large (67-100%)] [20:Large (67-100%)] Necrotic Amount: [16:Adherent Slough] [19:Adherent Slough] [20:Adherent Slough] Necrotic Tissue: [16:Fat Layer (Subcutaneous Tissue): Yes Fascia: No] [20:Fat Layer (Subcutaneous Tissue): Yes] Exposed Structures: [16:Fascia: No Tendon: No Muscle: No Joint: No Bone: No None] [19:Fat Layer (Subcutaneous Tissue): No Tendon: No Muscle: No Joint: No Bone: No None] [20:Fascia: No Tendon: No Muscle: No Joint: No Bone: No None] Epithelialization: [16:N/A] [19:N/A] [20:N/A] Wound Number: _0 Photos: No Photos No Photos No Photos Right, Posterior Lower Leg Right, Anterior Lower Leg Left, Lateral Foot Wound Location: Gradually Appeared Gradually Appeared Gradually Appeared 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 Primary Etiology: Anemia, Hypertension, Peripheral Anemia, Hypertension, Peripheral Anemia, Hypertension, Peripheral Comorbid History: Arterial Disease, Peripheral Venous Arterial Disease, Peripheral Venous Arterial Disease, Peripheral Venous Disease, Raynauds, Scleroderma, Disease, Raynauds, Scleroderma, Disease, Raynauds, Scleroderma, Rheumatoid Arthritis, Osteoarthritis Rheumatoid Arthritis, Osteoarthritis Rheumatoid Arthritis, Osteoarthritis 08/15/2020 10/10/2020 10/10/2020 Date Acquired: _1 Weeks of Treatment: Converted Open Open Wound Status: Yes No No Clustered Wound: 10x10x0.4 1.5x1x0.1 1.2x0.6x0.1 Measurements L x W x D (cm) 78.54 1.178 0.565 A (cm) : rea 31.416 0.118 0.057 Volume (cm) : -6893.80% 2.60% -49.90% % Reduction in Area: -27950.00% 67.50% -50.00% % Reduction in Volume: Full Thickness With Exposed Support Full Thickness Without Exposed Full Thickness Without Exposed Classification: Structures Support Structures Support Structures Large Large Large Exudate A mount: Purulent  Purulent Purulent Exudate Type: yellow, brown, green yellow, brown, green yellow, brown, green Exudate Color: Distinct, outline attached Distinct, outline attached Distinct, outline attached Wound Margin: None Present (0%) None Present (0%) None Present (0%) Granulation Amount: Large (67-100%) Large (67-100%) Large (67-100%) Necrotic Amount: Eschar, Adherent Slough Eschar, Adherent [facility], Adherent Slough Necrotic Tissue: Fascia: No Fascia: No Fat Layer (Subcutaneous Tissue): Yes Exposed Structures: Fat Layer (Subcutaneous Tissue): No Fat Layer (Subcutaneous Tissue): No Fascia: No Tendon: No Tendon: No Tendon: No Muscle: No Muscle: No Muscle: No Joint: No Joint: No Joint: No Bone: No Bone: No Bone: No None None None Epithelialization: converted to right lateral wound #26. 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 Etiology: Anemia, Hypertension, Peripheral Anemia, Hypertension, Peripheral Anemia, Hypertension, Peripheral Comorbid History: Arterial Disease, Peripheral Venous Arterial Disease, Peripheral Venous Arterial Disease, Peripheral Venous Disease, Raynauds, Scleroderma, Disease, Raynauds, Scleroderma, Disease, Raynauds, Scleroderma, Rheumatoid Arthritis, OsteoarthritisRheumatoid Arthritis, Osteoarthritis Rheumatoid Arthritis, Osteoarthritis 10/27/2020 11/07/2020 12/14/2020 Date  Acquired: _0 Weeks of Treatment: Open Open Open Wound Status: No No No Clustered Wound: 10x10x0.5 1.5x1.5x0.1 3x1x0.1 Measurements L x W x D (cm) 78.54 1.767 2.356 A (cm) : rea 39.27 0.177 0.236 Volume (cm) : -15766.70% -524.40% -100.00% % Reduction in  Area: -26433.80% -532.10% 66.60% % Reduction in Volume: Unclassifiable Full Thickness Without Exposed Full Thickness Without Exposed Classification: Support Structures Support Structures Large Large Large Exudate A mount: Purulent Purulent Purulent Exudate Type: yellow, brown, green yellow, brown, green yellow, brown, green Exudate Color: Distinct, outline attached Distinct, outline attached Distinct, outline attached Wound Margin: None Present (0%) None Present (0%) None Present (0%) Granulation Amount: Large (67-100%) Large (67-100%) Large (67-100%) Necrotic Amount: Eschar, Adherent Slough Eschar, Adherent Slough Eschar, Adherent Slough Necrotic Tissue: Fascia: No Fascia: No N/A Exposed Structures: Fat Layer (Subcutaneous Tissue): No Fat Layer (Subcutaneous Tissue): No Tendon: No Tendon: No Muscle: No Muscle: No Joint: No Joint: No Bone: No Bone: No None None None Epithelialization: N/A N/A N/A Assessment Notes: Wound Number: [id number] Photos: No Photos No Photos No Photos Right T Fourth oe Left, Medial Ankle Right T Great oe Wound Location: Gradually Appeared Gradually Appeared Gradually Appeared 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 Etiology: Anemia, Hypertension, Peripheral Anemia, Hypertension, Peripheral Anemia, Hypertension, Peripheral Comorbid History: Arterial Disease, Peripheral Venous Arterial Disease, Peripheral Venous Arterial Disease, Peripheral Venous Disease, Raynauds, Scleroderma, Disease, Raynauds, Scleroderma, Disease, Raynauds, Scleroderma, Rheumatoid Arthritis, Osteoarthritis Rheumatoid Arthritis, Osteoarthritis Rheumatoid Arthritis, Osteoarthritis 12/26/2020 01/09/2021 01/05/2021 Date Acquired: _1 Weeks of Treatment: Open Open Open Wound Status: No No No Clustered Wound: 1.5x2.8x0.1 0.3x1.1x0.1 1.7x1.8x0.1 Measurements L x W x D (cm) 3.299 0.259 2.403 A (cm) : rea 0.33 0.026 0.24 Volume  (cm) : 25.00% 65.60% -208.90% % Reduction in Area: 25.00% 65.30% -207.70% % Reduction in Volume: Full Thickness Without Exposed Full Thickness Without Exposed Full Thickness Without Exposed Classification: Support Structures Support Structures Support Structures Medium Large Large Exudate Amount: Serosanguineous Serosanguineous Purulent Exudate Type: red, brown red, brown yellow, brown, green Exudate Color: Distinct, outline attached Distinct, outline attached Distinct, outline attached Wound Margin: None Present (0%) None Present (0%) None Present (0%) Granulation Amount: Large (67-100%) Large (67-100%) Large (67-100%) Necrotic Amount: Eschar Adherent Slough Adherent Slough Necrotic Tissue: Fascia: No Fat Layer (Subcutaneous Tissue): Yes Fascia: No Exposed Structures: Fat Layer (Subcutaneous Tissue): No Fascia: No Fat Layer (Subcutaneous Tissue): No Tendon: No Tendon: No Tendon: No Muscle: No Muscle: No Muscle: No Joint: No Joint: No Joint: No Bone: No Bone: No Bone: No None None None Epithelialization: N/A N/A N/A Assessment Notes: Wound Number: [id number] N/A Photos: No Photos No Photos N/A Right T Second oe Right, Medial Lower Leg N/A Wound Location: Pressure Injury Gradually Appeared N/A Wounding Event: Pressure Ulcer Venous Leg Ulcer N/A Primary Etiology: Anemia, Hypertension, Peripheral Anemia, Hypertension, Peripheral N/A Comorbid History: Arterial Disease, Peripheral Venous Arterial Disease, Peripheral Venous Disease, Raynauds, Scleroderma, Disease, Raynauds, Scleroderma, Rheumatoid Arthritis, Osteoarthritis Rheumatoid Arthritis, Osteoarthritis 01/17/2021 01/13/2013 N/A Date Acquired: 0 417 N/A Weeks of Treatment: Open Open N/A Wound Status: No Yes N/A Clustered Wound: 1.6x0.7x0.1 2x2x0.2 N/A Measurements L x W x D (cm) 0.88 3.142 N/A A (cm) : rea 0.088 0.628 N/A Volume (cm) : -598.40% -122.20% N/A % Reduction in Area: -576.90% -345.40%  N/A % Reduction in Volume: Category/Stage III Full Thickness With Exposed Support N/A Classification: Structures Large Large N/A Exudate A mount: Serosanguineous Purulent N/A Exudate Type: red, brown yellow, brown, green N/A Exudate  Color: Distinct, outline attached Distinct, outline attached N/A Wound Margin: None Present (0%) None Present (0%) N/A Granulation Amount: Large (67-100%) Large (67-100%) N/A Necrotic Amount: Eschar, Adherent Slough Eschar, Adherent Slough N/A Necrotic Tissue: Fat Layer (Subcutaneous Tissue): Yes Fascia: No N/A Exposed Structures: Fascia: No Fat Layer (Subcutaneous Tissue): No Tendon: No Tendon: No Muscle: No Muscle: No Joint: No Joint: No Bone: No Bone: No None None N/A Epithelialization: N/A N/A N/A Assessment Notes: Treatment Notes Electronic Signature(s) Signed: 01/23/2021 5:37:42 PM By: Linton Ham MD Signed: 01/26/2021 5:48:06 PM By: Levan Hurst RN, BSN Entered By: Linton Ham on 01/23/2021 12:46:37 -------------------------------------------------------------------------------- Multi-Disciplinary Care Plan Details Patient Name: Date of Service: [name], [name] 01/23/2021 10:30 A M Medical Record Number: [id number] Patient Account Number: [id number] Date of Birth/Sex: Treating RN: August 20, 1951 (70 y.o. [name] Primary Care Jayshun Galentine: [name] Other Clinician: Referring Khing Belcher: Treating Arianna Delsanto/Extender: [name] in Treatment: 461 Active Inactive Wound/Skin Impairment Nursing Diagnoses: Impaired tissue integrity Knowledge deficit related to ulceration/compromised skin integrity Goals: Patient/caregiver will verbalize understanding of skin care regimen Date Initiated: 10/11/2016 Target Resolution Date: 02/10/2021 Goal Status: Active Interventions: Assess patient/caregiver ability to obtain necessary supplies Assess patient/caregiver ability to perform ulcer/skin care  regimen upon admission and as needed Assess ulceration(s) every visit Provide education on ulcer and skin care Treatment Activities: Skin care regimen initiated : 10/11/2016 Topical wound management initiated : 10/11/2016 Notes: Electronic Signature(s) Signed: 01/23/2021 6:10:15 PM By: Baruch Gouty RN, BSN Entered By: Baruch Gouty on 01/23/2021 11:51:33 -------------------------------------------------------------------------------- Pain Assessment Details Patient Name: Date of Service: [name] 01/23/2021 10:30 A M Medical Record Number: [id number] Patient Account Number: [id number] Date of Birth/Sex: Treating RN: 01/25/1951 (70 y.o. [name], [name] Primary Care Leiby Pigeon: [name] Other Clinician: Referring Wilberth Damon: Treating Dema Timmons/Extender: [name] in Treatment: 461 Active Problems Location of Pain Severity and Description of Pain Patient Has Paino Yes Site Locations Pain Location: Generalized Pain, Pain in Ulcers With Dressing Change: Yes Duration of the Pain. Constant / Intermittento Intermittent Rate the pain. Current Pain Level: 5 Worst Pain Level: 10 Least Pain Level: 0 Tolerable Pain Level: 7 Character of Pain Describe the Pain: Aching Pain Management and Medication Current Pain Management: Medication: Yes Cold Application: No Rest: Yes Massage: No Activity: No T.E.N.S.: No Heat Application: No Leg drop or elevation: No Is the Current Pain Management Adequate: Adequate How does your wound impact your activities of daily livingo Sleep: No Bathing: No Appetite: No Relationship With Others: No Bladder Continence: No Emotions: No Bowel Continence: No Work: No Toileting: No Drive: No Dressing: No Hobbies: No Electronic Signature(s) Signed: 01/25/2021 5:56:51 PM By: Rhae Hammock RN Entered By: Rhae Hammock on 01/23/2021  11:02:46 -------------------------------------------------------------------------------- Patient/Caregiver Education Details Patient Name: Date of Service: BLA [name] 1/24/2022andnbsp10:30 A M Medical Record Number: [id number] Patient Account Number: [id number] Date of Birth/Gender: Treating RN: [birth year] (70 y.o. [name] Primary Care Physician: [name] Other Clinician: Referring Physician: Treating Physician/Extender: [name] in Treatment: 461 Education Assessment Education Provided To: Patient Education Topics Provided Pain: Methods: Explain/Verbal Responses: Reinforcements needed, State content correctly Venous: Methods: Explain/Verbal Responses: Reinforcements needed, State content correctly Wound/Skin Impairment: Methods: Explain/Verbal Responses: Reinforcements needed, State content correctly Electronic Signature(s) Signed: 01/23/2021 6:10:15 PM By: Baruch Gouty RN, BSN Entered By: Baruch Gouty on 01/23/2021 11:52:31 -------------------------------------------------------------------------------- Wound Assessment Details Patient Name: Date of Service: [name], Denice Paradise  A N F. 01/23/2021 10:30 A M Medical Record Number: [id number] Patient Account Number: [id number] Date of Birth/Sex: Treating RN: 02/23/1951 (70 y.o. [name], [name] Primary Care Domenica Weightman: [name] Other Clinician: Referring Zailynn Brandel: Treating Nael Petrosyan/Extender: [name] in Treatment: [place] Wound Status Wound Number: 16 Primary Pressure Ulcer Etiology: Wound Location: Right Calcaneus Wound Open Wounding Event: Pressure Injury Status: Date Acquired: 01/04/2020 Comorbid Anemia, Hypertension, Peripheral Arterial Disease, Peripheral Weeks Of Treatment: 55 History: Venous Disease, Raynauds, Scleroderma, Rheumatoid Arthritis, Clustered Wound: No Osteoarthritis Photos Photo Uploaded By: Mikeal Hawthorne on  01/24/2021 13:41:44 Wound Measurements Length: (cm) 0.5 Width: (cm) 0.9 Depth: (cm) 0.4 Area: (cm) 0.353 Volume: (cm) 0.141 % Reduction in Area: 97.2% % Reduction in Volume: 88.8% Epithelialization: None Tunneling: No Undermining: No Wound Description Classification: Category/Stage III Wound Margin: Distinct, outline attached Exudate Amount: Large Exudate Type: Serosanguineous Exudate Color: red, brown Foul Odor After Cleansing: No Slough/Fibrino Yes Wound Bed Granulation Amount: None Present (0%) Exposed Structure Necrotic Amount: Large (67-100%) Fascia Exposed: No Necrotic Quality: Adherent Slough Fat Layer (Subcutaneous Tissue) Exposed: Yes Tendon Exposed: No Muscle Exposed: No Joint Exposed: No Bone Exposed: No Treatment Notes Wound #16 (Calcaneus) Wound Laterality: Right Cleanser Soap and Water Discharge Instruction: May shower and wash wound with dial antibacterial soap and water prior to dressing change. Peri-Wound Care Sween Lotion (Moisturizing lotion) Discharge Instruction: Apply moisturizing lotion as directed Topical Gentamicin Discharge Instruction: As directed by physician [facility] antibiotic Discharge Instruction: Once received, stop the gentamicin and Apply Keystone under the calcium alginate with silver. Primary Dressing KerraCel Ag Gelling Fiber Dressing, 4x5 in (silver alginate) Discharge Instruction: Apply silver alginate to wound bed as instructed Secondary Dressing Woven Gauze Sponge, Non-Sterile 4x4 in Discharge Instruction: Apply over primary dressing as directed. ABD Pad, 8x10 Discharge Instruction: Apply over primary dressing as directed. Secured With Compression Wrap Kerlix Roll 4.5x3.1 (in/yd) Discharge Instruction: Apply Kerlix and Coban compression as directed. Coban Self-Adherent Wrap 4x5 (in/yd) Discharge Instruction: Apply over Kerlix as directed. Compression Stockings Add-Ons Electronic Signature(s) Signed: 01/25/2021  5:56:51 PM By: Rhae Hammock RN Entered By: Rhae Hammock on 01/23/2021 11:34:39 -------------------------------------------------------------------------------- Wound Assessment Details Patient Name: Date of Service: [name] 01/23/2021 10:30 A M Medical Record Number: [id number] Patient Account Number: [id number] Date of Birth/Sex: Treating RN: Jun 09, 1951 (70 y.o. [name], [name] Primary Care Haig Gerardo: [name] Other Clinician: Referring Gailen Venne: Treating Kaitlyn Franko/Extender: [name] in Treatment: [place] Wound Status Wound Number: 19 Primary Venous Leg Ulcer Etiology: Wound Location: Right, Lateral Malleolus Wound Open Wounding Event: Gradually Appeared Status: Date Acquired: 05/02/2020 Comorbid Anemia, Hypertension, Peripheral Arterial Disease, Peripheral Weeks Of Treatment: 38 History: Venous Disease, Raynauds, Scleroderma, Rheumatoid Arthritis, Clustered Wound: No Osteoarthritis Photos Photo Uploaded By: Mikeal Hawthorne on 01/24/2021 13:43:53 Wound Measurements Length: (cm) 2.8 Width: (cm) 0.5 Depth: (cm) 0.1 Area: (cm) 1.1 Volume: (cm) 0.11 % Reduction in Area: -680.1% % Reduction in Volume: -292.9% Epithelialization: None Tunneling: No Undermining: No Wound Description Classification: Full Thickness Without Exposed Support Structures Wound Margin: Distinct, outline attached Exudate Amount: Large Exudate Type: Purulent Exudate Color: yellow, brown, green Foul Odor After Cleansing: No Slough/Fibrino Yes Wound Bed Granulation Amount: None Present (0%) Exposed Structure Necrotic Amount: Large (67-100%) Fascia Exposed: No Necrotic Quality: Adherent Slough Fat Layer (Subcutaneous Tissue) Exposed: No Tendon Exposed: No Muscle Exposed: No Joint Exposed: No Bone Exposed: No Treatment Notes Wound #19 (Malleolus) Wound Laterality: Right, Lateral Cleanser Soap and Water Discharge Instruction: May shower and  wash wound with dial antibacterial soap and water prior to dressing change. Peri-Wound Care Sween Lotion (Moisturizing lotion) Discharge Instruction: Apply moisturizing lotion as directed Topical Gentamicin Discharge Instruction: As directed by physician [facility] antibiotic Discharge Instruction: Once received, stop the gentamicin and Apply Keystone under the calcium alginate with silver. Primary Dressing KerraCel Ag Gelling Fiber Dressing, 4x5 in (silver alginate) Discharge Instruction: Apply silver alginate to wound bed as instructed Secondary Dressing Woven Gauze Sponge, Non-Sterile 4x4 in Discharge Instruction: Apply over primary dressing as directed. ABD Pad, 8x10 Discharge Instruction: Apply over primary dressing as directed. Secured With Compression Wrap Kerlix Roll 4.5x3.1 (in/yd) Discharge Instruction: Apply Kerlix and Coban compression as directed. Coban Self-Adherent Wrap 4x5 (in/yd) Discharge Instruction: Apply over Kerlix as directed. Compression Stockings Add-Ons Electronic Signature(s) Signed: 01/25/2021 5:56:51 PM By: Rhae Hammock RN Entered By: Rhae Hammock on 01/23/2021 11:25:41 -------------------------------------------------------------------------------- Wound Assessment Details Patient Name: Date of Service: [name] 01/23/2021 10:30 A M Medical Record Number: [id number] Patient Account Number: [id number] Date of Birth/Sex: Treating RN: 1951-12-01 (70 y.o. [name], [name] Primary Care Sister Carbone: [name] Other Clinician: Referring Lovell Roe: Treating Elaine Roanhorse/Extender: [name] in Treatment: [place] Wound Status Wound Number: 20 Primary Arterial Insufficiency Ulcer Etiology: Wound Location: Left, Lateral Malleolus Wound Open Wounding Event: Gradually Appeared Status: Date Acquired: 05/02/2020 Comorbid Anemia, Hypertension, Peripheral Arterial Disease, Peripheral Weeks Of Treatment: 38 History:  Venous Disease, Raynauds, Scleroderma, Rheumatoid Arthritis, Clustered Wound: No Clustered Wound: No Osteoarthritis Photos Photo Uploaded By: Mikeal Hawthorne on 01/24/2021 13:36:25 Wound Measurements Length: (cm) 0.9 Width: (cm) 0.6 Depth: (cm) 0.2 Area: (cm) 0.424 Volume: (cm) 0.085 % Reduction in Area: 3.6% % Reduction in Volume: 3.4% Epithelialization: None Tunneling: No Undermining: No Wound Description Classification: Full Thickness Without Exposed Support Structu Wound Margin: Distinct, outline attached Exudate Amount: Large Exudate Type: Serosanguineous Exudate Color: red, brown res Foul Odor After Cleansing: No Slough/Fibrino Yes Wound Bed Granulation Amount: None Present (0%) Exposed Structure Necrotic Amount: Large (67-100%) Fascia Exposed: No Necrotic Quality: Adherent Slough Fat Layer (Subcutaneous Tissue) Exposed: Yes Tendon Exposed: No Muscle Exposed: No Joint Exposed: No Bone Exposed: No Treatment Notes Wound #20 (Malleolus) Wound Laterality: Left, Lateral Cleanser Soap and Water Discharge Instruction: May shower and wash wound with dial antibacterial soap and water prior to dressing change. Peri-Wound Care Sween Lotion (Moisturizing lotion) Discharge Instruction: Apply moisturizing lotion as directed Topical Primary Dressing Cutimed Sorbact Swab Discharge Instruction: Apply to wound bed as instructed Secondary Dressing Woven Gauze Sponge, Non-Sterile 4x4 in Discharge Instruction: Apply over primary dressing as directed. ABD Pad, 8x10 Discharge Instruction: Apply over primary dressing as directed. Secured With Compression Wrap Kerlix Roll 4.5x3.1 (in/yd) Discharge Instruction: Apply Kerlix and Coban compression as directed. Coban Self-Adherent Wrap 4x5 (in/yd) Discharge Instruction: Apply over Kerlix as directed. Compression Stockings Add-Ons Electronic Signature(s) Signed: 01/25/2021 5:56:51 PM By: Rhae Hammock RN Entered By:  Rhae Hammock on 01/23/2021 11:33:36 -------------------------------------------------------------------------------- Wound Assessment Details Patient Name: Date of Service: [name] 01/23/2021 10:30 A M Medical Record Number: [id number] Patient Account Number: [id number] Date of Birth/Sex: Treating RN: [birth year] (70 y.o. [name], [name] Primary Care Lorance Pickeral: [name] Other Clinician: Referring Demir Titsworth: Treating Rupal Childress/Extender: [name] in Treatment: [place] Wound Status Wound Number: 23 Primary Venous Leg Ulcer Etiology: Wound Location: Right, Posterior Lower Leg Wound Converted Wounding Event: Gradually Appeared Status: Date Acquired: 08/15/2020 Comorbid Anemia, Hypertension, Peripheral Arterial Disease, Peripheral Weeks Of Treatment: 23 History: Venous  Disease, Raynauds, Scleroderma, Rheumatoid Arthritis, Clustered Wound: Yes Osteoarthritis Photos Photo Uploaded By: Mikeal Hawthorne on 01/24/2021 13:45:05 Wound Measurements Length: (cm) 10 Width: (cm) 10 Depth: (cm) 0.4 Area: (cm) 78.54 Volume: (cm) 31.416 % Reduction in Area: -6893.8% % Reduction in Volume: -27950% Epithelialization: None Tunneling: No Undermining: No Wound Description Classification: Full Thickness With Exposed Support Structures Wound Margin: Distinct, outline attached Exudate Amount: Large Exudate Type: Purulent Exudate Color: yellow, brown, green Foul Odor After Cleansing: No Slough/Fibrino Yes Wound Bed Granulation Amount: None Present (0%) Exposed Structure Necrotic Amount: Large (67-100%) Fascia Exposed: No Necrotic Quality: Eschar, Adherent Slough Fat Layer (Subcutaneous Tissue) Exposed: No Tendon Exposed: No Muscle Exposed: No Joint Exposed: No Bone Exposed: No Assessment Notes converted to right lateral wound #26. Treatment Notes Wound #23 (Lower Leg) Wound Laterality: Right, Posterior Cleanser Peri-Wound Care Topical Primary  Dressing Secondary Dressing Secured With Compression Wrap Compression Stockings Add-Ons Electronic Signature(s) Signed: 01/25/2021 5:56:51 PM By: Rhae Hammock RN Entered By: Rhae Hammock on 01/23/2021 11:42:16 -------------------------------------------------------------------------------- Wound Assessment Details Patient Name: Date of Service: [name] 01/23/2021 10:30 A M Medical Record Number: [id number] Patient Account Number: [id number] Date of Birth/Sex: Treating RN: 1951-12-16 (70 y.o. [name], [name] Primary Care Lisett Dirusso: [name] Other Clinician: Referring Danetra Glock: Treating Dezi Schaner/Extender: [name] in Treatment: [place] Wound Status Wound Number: 24 Primary Venous Leg Ulcer Etiology: Wound Location: Right, Anterior Lower Leg Wound Open Wounding Event: Gradually Appeared Status: Date Acquired: 10/10/2020 Comorbid Anemia, Hypertension, Peripheral Arterial Disease, Peripheral Weeks Of Treatment: 15 History: Venous Disease, Raynauds, Scleroderma, Rheumatoid Arthritis, Clustered Wound: No Osteoarthritis Photos Photo Uploaded By: Mikeal Hawthorne on 01/24/2021 13:43:26 Wound Measurements Length: (cm) 1.5 Width: (cm) 1 Depth: (cm) 0.1 Area: (cm) 1.178 Volume: (cm) 0.118 % Reduction in Area: 2.6% % Reduction in Volume: 67.5% Epithelialization: None Tunneling: No Undermining: No Wound Description Classification: Full Thickness Without Exposed Support Structures Wound Margin: Distinct, outline attached Exudate Amount: Large Exudate Type: Purulent Exudate Color: yellow, brown, green Foul Odor After Cleansing: No Slough/Fibrino Yes Wound Bed Granulation Amount: None Present (0%) Exposed Structure Necrotic Amount: Large (67-100%) Fascia Exposed: No Necrotic Quality: Eschar, Adherent Slough Fat Layer (Subcutaneous Tissue) Exposed: No Tendon Exposed: No Muscle Exposed: No Joint Exposed: No Bone Exposed:  No Treatment Notes Wound #24 (Lower Leg) Wound Laterality: Right, Anterior Cleanser Soap and Water Discharge Instruction: May shower and wash wound with dial antibacterial soap and water prior to dressing change. Peri-Wound Care Sween Lotion (Moisturizing lotion) Discharge Instruction: Apply moisturizing lotion as directed Topical Gentamicin Discharge Instruction: As directed by physician [facility] antibiotic Discharge Instruction: Once received, stop the gentamicin and Apply Keystone under the calcium alginate with silver. Primary Dressing KerraCel Ag Gelling Fiber Dressing, 4x5 in (silver alginate) Discharge Instruction: Apply silver alginate to wound bed as instructed Secondary Dressing Woven Gauze Sponge, Non-Sterile 4x4 in Discharge Instruction: Apply over primary dressing as directed. ABD Pad, 8x10 Discharge Instruction: Apply over primary dressing as directed. Secured With Compression Wrap Kerlix Roll 4.5x3.1 (in/yd) Discharge Instruction: Apply Kerlix and Coban compression as directed. Coban Self-Adherent Wrap 4x5 (in/yd) Discharge Instruction: Apply over Kerlix as directed. Compression Stockings Add-Ons Electronic Signature(s) Signed: 01/25/2021 5:56:51 PM By: Rhae Hammock RN Entered By: Rhae Hammock on 01/23/2021 11:29:26 -------------------------------------------------------------------------------- Wound Assessment Details Patient Name: Date of Service: [name] 01/23/2021 10:30 A M Medical Record Number: [id number] Patient Account Number: [id number] Date of Birth/Sex: Treating RN: June 26, 1951 (70 y.o. [name], [name] Primary  Care Jamese Trauger: [name] Other Clinician: Referring Amandalynn Pitz: Treating Lional Icenogle/Extender: [name] in Treatment: [place] Wound Status Wound Number: 25 Primary Venous Leg Ulcer Etiology: Wound Location: Left, Lateral Foot Wound Open Wounding Event: Gradually Appeared Status: Date  Acquired: 10/10/2020 Comorbid Anemia, Hypertension, Peripheral Arterial Disease, Peripheral Weeks Of Treatment: 15 History: Venous Disease, Raynauds, Scleroderma, Rheumatoid Arthritis, Clustered Wound: No Osteoarthritis Photos Photo Uploaded By: Mikeal Hawthorne on 01/24/2021 13:36:26 Wound Measurements Length: (cm) 1.2 Width: (cm) 0.6 Depth: (cm) 0.1 Area: (cm) 0.565 Volume: (cm) 0.057 % Reduction in Area: -49.9% % Reduction in Volume: -50% Epithelialization: None Tunneling: No Undermining: No Wound Description Classification: Full Thickness Without Exposed Support Structures Wound Margin: Distinct, outline attached Exudate Amount: Large Exudate Type: Purulent Exudate Color: yellow, brown, green Foul Odor After Cleansing: No Slough/Fibrino Yes Wound Bed Granulation Amount: None Present (0%) Exposed Structure Necrotic Amount: Large (67-100%) Fascia Exposed: No Necrotic Quality: Eschar, Adherent Slough Fat Layer (Subcutaneous Tissue) Exposed: Yes Tendon Exposed: No Muscle Exposed: No Joint Exposed: No Bone Exposed: No Treatment Notes Wound #25 (Foot) Wound Laterality: Left, Lateral Cleanser Soap and Water Discharge Instruction: May shower and wash wound with dial antibacterial soap and water prior to dressing change. Peri-Wound Care Sween Lotion (Moisturizing lotion) Discharge Instruction: Apply moisturizing lotion as directed Topical Primary Dressing Cutimed Sorbact Swab Discharge Instruction: Apply to wound bed as instructed Secondary Dressing Woven Gauze Sponge, Non-Sterile 4x4 in Discharge Instruction: Apply over primary dressing as directed. ABD Pad, 8x10 Discharge Instruction: Apply over primary dressing as directed. Secured With Compression Wrap Kerlix Roll 4.5x3.1 (in/yd) Discharge Instruction: Apply Kerlix and Coban compression as directed. Coban Self-Adherent Wrap 4x5 (in/yd) Discharge Instruction: Apply over Kerlix as directed. Compression  Stockings Add-Ons Electronic Signature(s) Signed: 01/25/2021 5:56:51 PM By: Rhae Hammock RN Entered By: Rhae Hammock on 01/23/2021 11:32:30 -------------------------------------------------------------------------------- Wound Assessment Details Patient Name: Date of Service: [name] 01/23/2021 10:30 A M Medical Record Number: [id number] Patient Account Number: [id number] Date of Birth/Sex: Treating RN: 1951-01-30 (70 y.o. [name], [name] Primary Care Cristina Ceniceros: [name] Other Clinician: Referring Alexiya Franqui: Treating Jagger Beahm/Extender: [name] in Treatment: [place] Wound Status Wound Number: 26 Primary Venous Leg Ulcer Etiology: Wound Location: Right, Lateral Lower Leg Wound Open Wounding Event: Gradually Appeared Status: Date Acquired: 10/27/2020 Comorbid Anemia, Hypertension, Peripheral Arterial Disease, Peripheral Weeks Of Treatment: 12 History: Venous Disease, Raynauds, Scleroderma, Rheumatoid Arthritis, Clustered Wound: No Osteoarthritis Photos Photo Uploaded By: Mikeal Hawthorne on 01/24/2021 13:44:44 Wound Measurements Length: (cm) 10 Width: (cm) 10 Depth: (cm) 0.5 Area: (cm) 78.54 Volume: (cm) 39.27 % Reduction in Area: -15766.7% % Reduction in Volume: -26433.8% Epithelialization: None Tunneling: No Undermining: No Wound Description Classification: Unclassifiable Wound Margin: Distinct, outline attached Exudate Amount: Large Exudate Type: Purulent Exudate Color: yellow, brown, green Foul Odor After Cleansing: No Slough/Fibrino Yes Wound Bed Granulation Amount: None Present (0%) Exposed Structure Necrotic Amount: Large (67-100%) Fascia Exposed: No Necrotic Quality: Eschar, Adherent Slough Fat Layer (Subcutaneous Tissue) Exposed: No Tendon Exposed: No Muscle Exposed: No Joint Exposed: No Bone Exposed: No Treatment Notes Wound #26 (Lower Leg) Wound Laterality: Right, Lateral Cleanser Soap and  Water Discharge Instruction: May shower and wash wound with dial antibacterial soap and water prior to dressing change. Peri-Wound Care Sween Lotion (Moisturizing lotion) Discharge Instruction: Apply moisturizing lotion as directed Topical Gentamicin Discharge Instruction: As directed by physician [facility] antibiotic Discharge Instruction: Once received, stop the gentamicin and Apply Keystone under the calcium alginate with silver. Primary Dressing  KerraCel Ag Gelling Fiber Dressing, 4x5 in (silver alginate) Discharge Instruction: Apply silver alginate to wound bed as instructed Secondary Dressing Woven Gauze Sponge, Non-Sterile 4x4 in Discharge Instruction: Apply over primary dressing as directed. ABD Pad, 8x10 Discharge Instruction: Apply over primary dressing as directed. Secured With Compression Wrap Kerlix Roll 4.5x3.1 (in/yd) Discharge Instruction: Apply Kerlix and Coban compression as directed. Coban Self-Adherent Wrap 4x5 (in/yd) Discharge Instruction: Apply over Kerlix as directed. Compression Stockings Add-Ons Electronic Signature(s) Signed: 01/25/2021 5:56:51 PM By: Rhae Hammock RN Entered By: Rhae Hammock on 01/23/2021 11:42:49 -------------------------------------------------------------------------------- Wound Assessment Details Patient Name: Date of Service: [name] 01/23/2021 10:30 A M Medical Record Number: [id number] Patient Account Number: [id number] Date of Birth/Sex: Treating RN: 1951-05-16 (70 y.o. [name], [name] Primary Care Mccormick Macon: [name] Other Clinician: Referring Guiselle Mian: Treating Takari Lundahl/Extender: [name] in Treatment: [place] Wound Status Wound Number: 27 Primary Venous Leg Ulcer Etiology: Wound Location: Right, Distal, Anterior Lower Leg Wound Open Wounding Event: Gradually Appeared Status: Date Acquired: 11/07/2020 Comorbid Anemia, Hypertension, Peripheral Arterial Disease,  Peripheral Weeks Of Treatment: 11 History: Venous Disease, Raynauds, Scleroderma, Rheumatoid Arthritis, Clustered Wound: No Osteoarthritis Photos Photo Uploaded By: Mikeal Hawthorne on 01/24/2021 13:43:26 Wound Measurements Length: (cm) 1.5 Width: (cm) 1.5 Depth: (cm) 0.1 Area: (cm) 1.767 Volume: (cm) 0.177 % Reduction in Area: -524.4% % Reduction in Volume: -532.1% Epithelialization: None Tunneling: No Undermining: No Wound Description Classification: Full Thickness Without Exposed Support Structures Wound Margin: Distinct, outline attached Exudate Amount: Large Exudate Type: Purulent Exudate Color: yellow, brown, green Foul Odor After Cleansing: No Slough/Fibrino Yes Wound Bed Granulation Amount: None Present (0%) Exposed Structure Necrotic Amount: Large (67-100%) Fascia Exposed: No Necrotic Quality: Eschar, Adherent Slough Fat Layer (Subcutaneous Tissue) Exposed: No Tendon Exposed: No Muscle Exposed: No Joint Exposed: No Bone Exposed: No Treatment Notes Wound #27 (Lower Leg) Wound Laterality: Right, Anterior, Distal Cleanser Soap and Water Discharge Instruction: May shower and wash wound with dial antibacterial soap and water prior to dressing change. Peri-Wound Care Sween Lotion (Moisturizing lotion) Discharge Instruction: Apply moisturizing lotion as directed Topical Gentamicin Discharge Instruction: As directed by physician [facility] antibiotic Discharge Instruction: Once received, stop the gentamicin and Apply Keystone under the calcium alginate with silver. Primary Dressing KerraCel Ag Gelling Fiber Dressing, 4x5 in (silver alginate) Discharge Instruction: Apply silver alginate to wound bed as instructed Secondary Dressing Woven Gauze Sponge, Non-Sterile 4x4 in Discharge Instruction: Apply over primary dressing as directed. ABD Pad, 8x10 Discharge Instruction: Apply over primary dressing as directed. Secured With Compression Wrap Kerlix Roll 4.5x3.1  (in/yd) Discharge Instruction: Apply Kerlix and Coban compression as directed. Coban Self-Adherent Wrap 4x5 (in/yd) Discharge Instruction: Apply over Kerlix as directed. Compression Stockings Add-Ons Electronic Signature(s) Signed: 01/25/2021 5:56:51 PM By: Rhae Hammock RN Entered By: Rhae Hammock on 01/23/2021 11:24:10 -------------------------------------------------------------------------------- Wound Assessment Details Patient Name: Date of Service: [name] 01/23/2021 10:30 A M Medical Record Number: [id number] Patient Account Number: [id number] Date of Birth/Sex: Treating RN: [birth year] (70 y.o. [name], [name] Primary Care Ivori Storr: [name] Other Clinician: Referring Deiondre Harrower: Treating Leocadia Idleman/Extender: [name] in Treatment: [place] Wound Status Wound Number: 29 Primary Arterial Insufficiency Ulcer Etiology: Wound Location: Right, Proximal, Lateral Lower Leg Wound Open Wounding Event: Gradually Appeared Status: Date Acquired: 12/14/2020 Comorbid Anemia, Hypertension, Peripheral Arterial Disease, Peripheral Weeks Of Treatment: 5 History: Venous Disease, Raynauds, Scleroderma, Rheumatoid Arthritis, Clustered Wound: No Osteoarthritis Photos Photo Uploaded ByMikeal Hawthorne on 01/24/2021  13:45:06 Wound Measurements Length: (cm) 3 Width: (cm) 1 Depth: (cm) 0.1 Area: (cm) 2.356 Volume: (cm) 0.236 % Reduction in Area: -100% % Reduction in Volume: 66.6% Epithelialization: None Tunneling: No Undermining: No Wound Description Classification: Full Thickness Without Exposed Support Structures Wound Margin: Distinct, outline attached Exudate Amount: Large Exudate Type: Purulent Exudate Color: yellow, brown, green Foul Odor After Cleansing: No Slough/Fibrino Yes Wound Bed Granulation Amount: None Present (0%) Necrotic Amount: Large (67-100%) Necrotic Quality: Eschar, Adherent Slough Treatment Notes Wound  #29 (Lower Leg) Wound Laterality: Right, Lateral, Proximal Cleanser Soap and Water Discharge Instruction: May shower and wash wound with dial antibacterial soap and water prior to dressing change. Peri-Wound Care Sween Lotion (Moisturizing lotion) Discharge Instruction: Apply moisturizing lotion as directed Topical Gentamicin Discharge Instruction: As directed by physician [facility] antibiotic Discharge Instruction: Once received, stop the gentamicin and Apply Keystone under the calcium alginate with silver. Primary Dressing KerraCel Ag Gelling Fiber Dressing, 4x5 in (silver alginate) Discharge Instruction: Apply silver alginate to wound bed as instructed Secondary Dressing Woven Gauze Sponge, Non-Sterile 4x4 in Discharge Instruction: Apply over primary dressing as directed. ABD Pad, 8x10 Discharge Instruction: Apply over primary dressing as directed. Secured With Compression Wrap Kerlix Roll 4.5x3.1 (in/yd) Discharge Instruction: Apply Kerlix and Coban compression as directed. Coban Self-Adherent Wrap 4x5 (in/yd) Discharge Instruction: Apply over Kerlix as directed. Compression Stockings Add-Ons Electronic Signature(s) Signed: 01/25/2021 5:56:51 PM By: Rhae Hammock RN Entered By: Rhae Hammock on 01/23/2021 11:28:21 -------------------------------------------------------------------------------- Wound Assessment Details Patient Name: Date of Service: [name] 01/23/2021 10:30 A M Medical Record Number: [id number] Patient Account Number: [id number] Date of Birth/Sex: Treating RN: Jul 24, 1951 (70 y.o. [name], [name] Primary Care Carleigh Buccieri: [name] Other Clinician: Referring Yee Gangi: Treating Illyana Schorsch/Extender: [name] in Treatment: [place] Wound Status Wound Number: 30 Primary Arterial Insufficiency Ulcer Etiology: Wound Location: Right T Fourth oe Wound Open Wounding Event: Gradually Appeared Status: Date Acquired:  12/26/2020 Comorbid Anemia, Hypertension, Peripheral Arterial Disease, Peripheral Weeks Of Treatment: 4 History: Venous Disease, Raynauds, Scleroderma, Rheumatoid Arthritis, Clustered Wound: No Osteoarthritis Wound Measurements Length: (cm) 1.5 Width: (cm) 2.8 Depth: (cm) 0.1 Area: (cm) 3.299 Volume: (cm) 0.33 % Reduction in Area: 25% % Reduction in Volume: 25% Epithelialization: None Tunneling: No Undermining: No Wound Description Classification: Full Thickness Without Exposed Support Structures Wound Margin: Distinct, outline attached Exudate Amount: Medium Exudate Type: Serosanguineous Exudate Color: red, brown Foul Odor After Cleansing: No Slough/Fibrino Yes Wound Bed Granulation Amount: None Present (0%) Exposed Structure Necrotic Amount: Large (67-100%) Fascia Exposed: No Necrotic Quality: Eschar Fat Layer (Subcutaneous Tissue) Exposed: No Tendon Exposed: No Muscle Exposed: No Joint Exposed: No Bone Exposed: No Treatment Notes Wound #30 (Toe Fourth) Wound Laterality: Right Cleanser Soap and Water Discharge Instruction: May shower and wash wound with dial antibacterial soap and water prior to dressing change. Peri-Wound Care Sween Lotion (Moisturizing lotion) Discharge Instruction: Apply moisturizing lotion as directed Topical Gentamicin Discharge Instruction: As directed by physician [facility] antibiotic Discharge Instruction: Once received, stop the gentamicin and Apply Keystone under the calcium alginate with silver. Primary Dressing KerraCel Ag Gelling Fiber Dressing, 4x5 in (silver alginate) Discharge Instruction: Apply silver alginate to wound bed as instructed Secondary Dressing Woven Gauze Sponge, Non-Sterile 4x4 in Discharge Instruction: Apply over primary dressing as directed. ABD Pad, 8x10 Discharge Instruction: Apply over primary dressing as directed. Secured With Compression Wrap Kerlix Roll 4.5x3.1 (in/yd) Discharge Instruction: Apply  Kerlix and Coban compression as directed. Coban Self-Adherent Wrap 4x5 (in/yd) Discharge  Instruction: Apply over Kerlix as directed. Compression Stockings Add-Ons Electronic Signature(s) Signed: 01/25/2021 5:56:51 PM By: Rhae Hammock RN Entered By: Rhae Hammock on 01/23/2021 11:20:59 -------------------------------------------------------------------------------- Wound Assessment Details Patient Name: Date of Service: [name] 01/23/2021 10:30 A M Medical Record Number: [id number] Patient Account Number: [id number] Date of Birth/Sex: Treating RN: May 20, 1951 (70 y.o. [name], [name] Primary Care Gustavo Meditz: [name] Other Clinician: Referring Sabrine Patchen: Treating Taylyn Brame/Extender: [name] in Treatment: [place] Wound Status Wound Number: 31 Primary Venous Leg Ulcer Etiology: Wound Location: Left, Medial Ankle Wound Open Wounding Event: Gradually Appeared Status: Date Acquired: 01/09/2021 Comorbid Anemia, Hypertension, Peripheral Arterial Disease, Peripheral Weeks Of Treatment: 2 History: Venous Disease, Raynauds, Scleroderma, Rheumatoid Arthritis, Clustered Wound: No Osteoarthritis Photos Photo Uploaded By: Mikeal Hawthorne on 01/24/2021 13:34:23 Wound Measurements Length: (cm) 0.3 Width: (cm) 1.1 Depth: (cm) 0.1 Area: (cm) 0.259 Volume: (cm) 0.026 % Reduction in Area: 65.6% % Reduction in Volume: 65.3% Epithelialization: None Tunneling: No Undermining: No Wound Description Classification: Full Thickness Without Exposed Support Structures Wound Margin: Distinct, outline attached Exudate Amount: Large Exudate Type: Serosanguineous Exudate Color: red, brown Foul Odor After Cleansing: No Slough/Fibrino Yes Wound Bed Granulation Amount: None Present (0%) Exposed Structure Necrotic Amount: Large (67-100%) Fascia Exposed: No Necrotic Quality: Adherent Slough Fat Layer (Subcutaneous Tissue) Exposed: Yes Tendon Exposed:  No Muscle Exposed: No Joint Exposed: No Bone Exposed: No Treatment Notes Wound #31 (Ankle) Wound Laterality: Left, Medial Cleanser Soap and Water Discharge Instruction: May shower and wash wound with dial antibacterial soap and water prior to dressing change. Peri-Wound Care Sween Lotion (Moisturizing lotion) Discharge Instruction: Apply moisturizing lotion as directed Topical Primary Dressing Cutimed Sorbact Swab Discharge Instruction: Apply to wound bed as instructed Secondary Dressing Woven Gauze Sponge, Non-Sterile 4x4 in Discharge Instruction: Apply over primary dressing as directed. ABD Pad, 8x10 Discharge Instruction: Apply over primary dressing as directed. Secured With Compression Wrap Kerlix Roll 4.5x3.1 (in/yd) Discharge Instruction: Apply Kerlix and Coban compression as directed. Coban Self-Adherent Wrap 4x5 (in/yd) Discharge Instruction: Apply over Kerlix as directed. Compression Stockings Add-Ons Electronic Signature(s) Signed: 01/25/2021 5:56:51 PM By: Rhae Hammock RN Entered By: Rhae Hammock on 01/23/2021 11:36:30 -------------------------------------------------------------------------------- Wound Assessment Details Patient Name: Date of Service: [name] 01/23/2021 10:30 A M Medical Record Number: [id number] Patient Account Number: [id number] Date of Birth/Sex: Treating RN: 04-Aug-1951 (70 y.o. [name], [name] Primary Care Romayne Ticas: [name] Other Clinician: Referring Jamason Peckham: Treating Cattie Tineo/Extender: [name] in Treatment: [place] Wound Status Wound Number: 32 Primary Auto-immune Etiology: Wound Location: Right T Great oe Wound Open Wounding Event: Gradually Appeared Status: Date Acquired: 01/05/2021 Comorbid Anemia, Hypertension, Peripheral Arterial Disease, Peripheral Weeks Of Treatment: 2 History: Venous Disease, Raynauds, Scleroderma, Rheumatoid Arthritis, Clustered Wound: No  Osteoarthritis Photos Photo Uploaded By: Mikeal Hawthorne on 01/24/2021 13:41:16 Wound Measurements Length: (cm) 1.7 Width: (cm) 1.8 Depth: (cm) 0.1 Area: (cm) 2.403 Volume: (cm) 0.24 % Reduction in Area: -208.9% % Reduction in Volume: -207.7% Epithelialization: None Tunneling: No Undermining: No Wound Description Classification: Full Thickness Without Exposed Support Structures Wound Margin: Distinct, outline attached Exudate Amount: Large Exudate Type: Purulent Exudate Color: yellow, brown, green Foul Odor After Cleansing: No Slough/Fibrino Yes Wound Bed Granulation Amount: None Present (0%) Exposed Structure Necrotic Amount: Large (67-100%) Fascia Exposed: No Necrotic Quality: Adherent Slough Fat Layer (Subcutaneous Tissue) Exposed: No Tendon Exposed: No Muscle Exposed: No Joint Exposed: No Bone Exposed: No Treatment Notes Wound #32 (Toe Great) Wound  Laterality: Right Cleanser Soap and Water Discharge Instruction: May shower and wash wound with dial antibacterial soap and water prior to dressing change. Peri-Wound Care Sween Lotion (Moisturizing lotion) Discharge Instruction: Apply moisturizing lotion as directed Topical Gentamicin Discharge Instruction: As directed by physician [facility] antibiotic Discharge Instruction: Once received, stop the gentamicin and Apply Keystone under the calcium alginate with silver. Primary Dressing KerraCel Ag Gelling Fiber Dressing, 4x5 in (silver alginate) Discharge Instruction: Apply silver alginate to wound bed as instructed Secondary Dressing Woven Gauze Sponge, Non-Sterile 4x4 in Discharge Instruction: Apply over primary dressing as directed. ABD Pad, 8x10 Discharge Instruction: Apply over primary dressing as directed. Secured With Compression Wrap Kerlix Roll 4.5x3.1 (in/yd) Discharge Instruction: Apply Kerlix and Coban compression as directed. Coban Self-Adherent Wrap 4x5 (in/yd) Discharge Instruction: Apply over  Kerlix as directed. Compression Stockings Add-Ons Electronic Signature(s) Signed: 01/25/2021 5:56:51 PM By: Rhae Hammock RN Entered By: Rhae Hammock on 01/23/2021 11:22:29 -------------------------------------------------------------------------------- Wound Assessment Details Patient Name: Date of Service: [name] 01/23/2021 10:30 A M Medical Record Number: [id number] Patient Account Number: [id number] Date of Birth/Sex: Treating RN: 1951/06/08 (70 y.o. [name], [name] Primary Care Nanna Ertle: [name] Other Clinician: Referring Montrail Mehrer: Treating Mikeal Winstanley/Extender: [name] in Treatment: [place] Wound Status Wound Number: 33 Primary Pressure Ulcer Etiology: Wound Location: Right T Second oe Wound Open Wounding Event: Pressure Injury Status: Date Acquired: 01/17/2021 Comorbid Anemia, Hypertension, Peripheral Arterial Disease, Peripheral Weeks Of Treatment: 0 History: Venous Disease, Raynauds, Scleroderma, Rheumatoid Arthritis, Clustered Wound: No Osteoarthritis Photos Photo Uploaded By: Mikeal Hawthorne on 01/24/2021 13:41:16 Wound Measurements Length: (cm) 1.6 Width: (cm) 0.7 Depth: (cm) 0.1 Area: (cm) 0.88 Volume: (cm) 0.088 % Reduction in Area: -598.4% % Reduction in Volume: -576.9% Epithelialization: None Tunneling: No Undermining: No Wound Description Classification: Category/Stage III Wound Margin: Distinct, outline attached Exudate Amount: Large Exudate Type: Serosanguineous Exudate Color: red, brown Foul Odor After Cleansing: No Slough/Fibrino Yes Wound Bed Granulation Amount: None Present (0%) Exposed Structure Necrotic Amount: Large (67-100%) Fascia Exposed: No Necrotic Quality: Eschar, Adherent Slough Fat Layer (Subcutaneous Tissue) Exposed: Yes Tendon Exposed: No Muscle Exposed: No Joint Exposed: No Bone Exposed: No Treatment Notes Wound #33 (Toe Second) Wound Laterality:  Right Cleanser Soap and Water Discharge Instruction: May shower and wash wound with dial antibacterial soap and water prior to dressing change. Peri-Wound Care Sween Lotion (Moisturizing lotion) Discharge Instruction: Apply moisturizing lotion as directed Topical Gentamicin Discharge Instruction: As directed by physician [facility] antibiotic Discharge Instruction: Once received, stop the gentamicin and Apply Keystone under the calcium alginate with silver. Primary Dressing KerraCel Ag Gelling Fiber Dressing, 4x5 in (silver alginate) Discharge Instruction: Apply silver alginate to wound bed as instructed Secondary Dressing Woven Gauze Sponge, Non-Sterile 4x4 in Discharge Instruction: Apply over primary dressing as directed. ABD Pad, 8x10 Discharge Instruction: Apply over primary dressing as directed. Secured With Compression Wrap Kerlix Roll 4.5x3.1 (in/yd) Discharge Instruction: Apply Kerlix and Coban compression as directed. Coban Self-Adherent Wrap 4x5 (in/yd) Discharge Instruction: Apply over Kerlix as directed. Compression Stockings Add-Ons Electronic Signature(s) Signed: 01/25/2021 5:56:51 PM By: Rhae Hammock RN Entered By: Rhae Hammock on 01/23/2021 11:37:49 -------------------------------------------------------------------------------- Wound Assessment Details Patient Name: Date of Service: [name] 01/23/2021 10:30 A M Medical Record Number: [id number] Patient Account Number: [id number] Date of Birth/Sex: Treating RN: 10-23-1951 (70 y.o. [name], [name] Primary Care Rosalyn Archambault: [name] Other Clinician: Referring Dayyan Krist: Treating Ferman Basilio/Extender: [name] in Treatment: 461 Wound Status  Wound Number: 5 Primary Venous Leg Ulcer Etiology: Wound Location: Right, Medial Lower Leg Wound Open Wounding Event: Gradually Appeared Status: Date Acquired: 01/13/2013 Comorbid Anemia, Hypertension, Peripheral  Arterial Disease, Peripheral Weeks Of Treatment: 417 History: Venous Disease, Raynauds, Scleroderma, Rheumatoid Arthritis, Clustered Wound: Yes Osteoarthritis Photos Photo Uploaded By: Mikeal Hawthorne on 01/24/2021 13:42:34 Wound Measurements Length: (cm) 2 Width: (cm) 2 Depth: (cm) 0.2 Area: (cm) 3.142 Volume: (cm) 0.628 % Reduction in Area: -122.2% % Reduction in Volume: -345.4% Epithelialization: None Tunneling: No Undermining: No Wound Description Classification: Full Thickness With Exposed Support Structures Wound Margin: Distinct, outline attached Exudate Amount: Large Exudate Type: Purulent Exudate Color: yellow, brown, green Foul Odor After Cleansing: No Slough/Fibrino Yes Wound Bed Granulation Amount: None Present (0%) Exposed Structure Necrotic Amount: Large (67-100%) Fascia Exposed: No Necrotic Quality: Eschar, Adherent Slough Fat Layer (Subcutaneous Tissue) Exposed: No Tendon Exposed: No Muscle Exposed: No Joint Exposed: No Bone Exposed: No Treatment Notes Wound #5 (Lower Leg) Wound Laterality: Right, Medial Cleanser Soap and Water Discharge Instruction: May shower and wash wound with dial antibacterial soap and water prior to dressing change. Peri-Wound Care Sween Lotion (Moisturizing lotion) Discharge Instruction: Apply moisturizing lotion as directed Topical Gentamicin Discharge Instruction: As directed by physician [facility] antibiotic Discharge Instruction: Once received, stop the gentamicin and Apply Keystone under the calcium alginate with silver. Primary Dressing KerraCel Ag Gelling Fiber Dressing, 4x5 in (silver alginate) Discharge Instruction: Apply silver alginate to wound bed as instructed Secondary Dressing Woven Gauze Sponge, Non-Sterile 4x4 in Discharge Instruction: Apply over primary dressing as directed. ABD Pad, 8x10 Discharge Instruction: Apply over primary dressing as directed. Secured With Compression Wrap Kerlix Roll 4.5x3.1  (in/yd) Discharge Instruction: Apply Kerlix and Coban compression as directed. Coban Self-Adherent Wrap 4x5 (in/yd) Discharge Instruction: Apply over Kerlix as directed. Compression Stockings Add-Ons Electronic Signature(s) Signed: 01/25/2021 5:56:51 PM By: Rhae Hammock RN Entered By: Rhae Hammock on 01/23/2021 11:30:47 -------------------------------------------------------------------------------- Vitals Details Patient Name: Date of Service: [name], [name] 01/23/2021 10:30 A M Medical Record Number: [id number] Patient Account Number: [id number] Date of Birth/Sex: Treating RN: [birth year] (70 y.o. [name], [name] Primary Care Myisha Pickerel: [name] Other Clinician: Referring Shatina Streets: Treating Fonnie Crookshanks/Extender: [name] in Treatment: 461 Vital Signs Time Taken: 10:55 Temperature (F): 98.1 Height (in): 68 Pulse (bpm): 84 Weight (lbs): 132 Respiratory Rate (breaths/min): 17 Body Mass Index (BMI): 20.1 Blood Pressure (mmHg): 148/74 Reference Range: 80 - 120 mg / dl Electronic Signature(s) Signed: 01/25/2021 5:56:51 PM By: Rhae Hammock RN Entered By: Rhae Hammock on 01/23/2021 11:01:36

## 2021-01-27 LAB — IRON AND TIBC
Iron: 29 ug/dL — ABNORMAL LOW (ref 41–142)
Saturation Ratios: 11 % — ABNORMAL LOW (ref 21–57)
TIBC: 270 ug/dL (ref 236–444)
UIBC: 241 ug/dL (ref 120–384)

## 2021-01-27 LAB — ERYTHROPOIETIN: Erythropoietin: 25 m[IU]/mL — ABNORMAL HIGH (ref 2.6–18.5)

## 2021-01-27 LAB — FERRITIN: Ferritin: 270 ng/mL (ref 11–307)

## 2021-01-30 ENCOUNTER — Other Ambulatory Visit (HOSPITAL_COMMUNITY): Payer: Self-pay | Admitting: *Deleted

## 2021-01-30 ENCOUNTER — Telehealth: Payer: Self-pay | Admitting: Hematology and Oncology

## 2021-01-30 ENCOUNTER — Encounter (HOSPITAL_BASED_OUTPATIENT_CLINIC_OR_DEPARTMENT_OTHER): Payer: Medicare Other | Admitting: Internal Medicine

## 2021-01-30 NOTE — Telephone Encounter (Signed)
Scheduled per 1/27 los. Called and spoke with pt, confirmed 2/2 appt. Pt will get calendar print out at next appt

## 2021-01-30 NOTE — Patient Instructions (Signed)
Continue with current medications.  Increase Celexa from 20 to 40 mg daily for depression.  We will plan to see you in 6 months.  Continue follow-up with specialist with Dr. Oneida Alar, Dr. Lamonte Sakai, Dr. Dellia Nims and if necessary Dr. Watt Climes.  Iron infusion planned as ordered by GYN for iron deficiency anemia.

## 2021-01-31 ENCOUNTER — Telehealth: Payer: Self-pay | Admitting: Internal Medicine

## 2021-01-31 ENCOUNTER — Telehealth: Payer: Self-pay | Admitting: *Deleted

## 2021-01-31 ENCOUNTER — Ambulatory Visit (HOSPITAL_COMMUNITY)
Admission: RE | Admit: 2021-01-31 | Discharge: 2021-01-31 | Disposition: A | Discharge status: Home / Self Care | Payer: Medicare Other | Source: Ambulatory Visit | Attending: Internal Medicine | Admitting: Internal Medicine

## 2021-01-31 ENCOUNTER — Encounter: Payer: Self-pay | Admitting: Plastic Surgery

## 2021-01-31 ENCOUNTER — Other Ambulatory Visit: Payer: Self-pay

## 2021-01-31 ENCOUNTER — Encounter (HOSPITAL_COMMUNITY)
Admission: RE | Admit: 2021-01-31 | Discharge: 2021-01-31 | Disposition: A | Discharge status: Home / Self Care | Payer: Medicare Other | Source: Ambulatory Visit | Attending: Internal Medicine | Admitting: Internal Medicine

## 2021-01-31 DIAGNOSIS — X58XXXA Exposure to other specified factors, initial encounter: Secondary | ICD-10-CM | POA: Insufficient documentation

## 2021-01-31 DIAGNOSIS — I739 Peripheral vascular disease, unspecified: Secondary | ICD-10-CM | POA: Insufficient documentation

## 2021-01-31 DIAGNOSIS — Z452 Encounter for adjustment and management of vascular access device: Secondary | ICD-10-CM | POA: Diagnosis not present

## 2021-01-31 DIAGNOSIS — L089 Local infection of the skin and subcutaneous tissue, unspecified: Secondary | ICD-10-CM | POA: Insufficient documentation

## 2021-01-31 DIAGNOSIS — M341 CR(E)ST syndrome: Secondary | ICD-10-CM | POA: Diagnosis not present

## 2021-01-31 DIAGNOSIS — T148XXA Other injury of unspecified body region, initial encounter: Secondary | ICD-10-CM

## 2021-01-31 HISTORY — PX: IR FLUORO GUIDE CV LINE LEFT: IMG2282

## 2021-01-31 MED ORDER — LIDOCAINE HCL 1 % IJ SOLN
INTRAMUSCULAR | Status: AC
  Filled 2021-01-31: qty 20

## 2021-01-31 MED ORDER — HEPARIN SOD (PORK) LOCK FLUSH 100 UNIT/ML IV SOLN: 250.0000 [IU] | INTRAVENOUS | Status: DC | PRN

## 2021-01-31 MED ORDER — LIDOCAINE HCL 1 % IJ SOLN
INTRAMUSCULAR | Status: AC | PRN
Start: 2021-01-31 — End: 2021-01-31
  Administered 2021-01-31: 10 mL

## 2021-01-31 MED ORDER — HEPARIN SOD (PORK) LOCK FLUSH 100 UNIT/ML IV SOLN: 250.0000 [IU] | Freq: Every day | INTRAVENOUS | Status: DC

## 2021-01-31 MED ORDER — SODIUM CHLORIDE 0.9 % IV SOLN
2.0000 g | Freq: Once | INTRAVENOUS | Status: AC
  Administered 2021-01-31: 2 g via INTRAVENOUS
  Filled 2021-01-31: qty 2

## 2021-01-31 MED ORDER — HEPARIN SOD (PORK) LOCK FLUSH 100 UNIT/ML IV SOLN
INTRAVENOUS | Status: AC
  Administered 2021-01-31: 250 [IU]
  Filled 2021-01-31: qty 5

## 2021-01-31 NOTE — Telephone Encounter (Signed)
Faxed Signed Surgery Clearance Form to [facility] [phone]. Phone [phone]

## 2021-01-31 NOTE — Telephone Encounter (Signed)
Per Matthew,PA-C-Can someone please call this patient and let them know to hold her Plavix starting tomorrow and restarting on 02/07/2021 after her surgery with Dr. [name]. Thanks, let me know.   Called and spoke with the patient and informed her of the message above.  Patient verbalized understanding and again.//AB/CMA

## 2021-01-31 NOTE — Procedures (Signed)
Successful placement of single lumen PICC line to right basilic vein. Length 39 cm Tip at lower SVC/RA PICC capped No complications Ready for use.  EBL < 5 mL   Ascencion Dike PA-C 01/31/2021 9:41 AM

## 2021-02-01 ENCOUNTER — Inpatient Hospital Stay: Payer: Medicare Other | Attending: Hematology and Oncology

## 2021-02-01 ENCOUNTER — Other Ambulatory Visit: Payer: Self-pay

## 2021-02-01 VITALS — BP 158/86 | HR 82 | Temp 98.3°F | Resp 20

## 2021-02-01 DIAGNOSIS — E039 Hypothyroidism, unspecified: Secondary | ICD-10-CM | POA: Diagnosis not present

## 2021-02-01 DIAGNOSIS — I87331 Chronic venous hypertension (idiopathic) with ulcer and inflammation of right lower extremity: Secondary | ICD-10-CM | POA: Diagnosis not present

## 2021-02-01 DIAGNOSIS — L97812 Non-pressure chronic ulcer of other part of right lower leg with fat layer exposed: Secondary | ICD-10-CM | POA: Diagnosis not present

## 2021-02-01 DIAGNOSIS — B965 Pseudomonas (aeruginosa) (mallei) (pseudomallei) as the cause of diseases classified elsewhere: Secondary | ICD-10-CM | POA: Diagnosis not present

## 2021-02-01 DIAGNOSIS — J8489 Other specified interstitial pulmonary diseases: Secondary | ICD-10-CM | POA: Diagnosis not present

## 2021-02-01 DIAGNOSIS — B952 Enterococcus as the cause of diseases classified elsewhere: Secondary | ICD-10-CM | POA: Diagnosis not present

## 2021-02-01 DIAGNOSIS — J9610 Chronic respiratory failure, unspecified whether with hypoxia or hypercapnia: Secondary | ICD-10-CM | POA: Diagnosis not present

## 2021-02-01 DIAGNOSIS — M3481 Systemic sclerosis with lung involvement: Secondary | ICD-10-CM | POA: Diagnosis not present

## 2021-02-01 DIAGNOSIS — M341 CR(E)ST syndrome: Secondary | ICD-10-CM | POA: Diagnosis not present

## 2021-02-01 DIAGNOSIS — I130 Hypertensive heart and chronic kidney disease with heart failure and stage 1 through stage 4 chronic kidney disease, or unspecified chronic kidney disease: Secondary | ICD-10-CM | POA: Diagnosis not present

## 2021-02-01 DIAGNOSIS — I70209 Unspecified atherosclerosis of native arteries of extremities, unspecified extremity: Secondary | ICD-10-CM | POA: Diagnosis not present

## 2021-02-01 DIAGNOSIS — L03115 Cellulitis of right lower limb: Secondary | ICD-10-CM | POA: Diagnosis not present

## 2021-02-01 DIAGNOSIS — K922 Gastrointestinal hemorrhage, unspecified: Secondary | ICD-10-CM | POA: Insufficient documentation

## 2021-02-01 DIAGNOSIS — E782 Mixed hyperlipidemia: Secondary | ICD-10-CM | POA: Diagnosis not present

## 2021-02-01 DIAGNOSIS — F419 Anxiety disorder, unspecified: Secondary | ICD-10-CM | POA: Diagnosis not present

## 2021-02-01 DIAGNOSIS — F32A Depression, unspecified: Secondary | ICD-10-CM | POA: Diagnosis not present

## 2021-02-01 DIAGNOSIS — N1831 Chronic kidney disease, stage 3a: Secondary | ICD-10-CM | POA: Diagnosis not present

## 2021-02-01 DIAGNOSIS — K2940 Chronic atrophic gastritis without bleeding: Secondary | ICD-10-CM | POA: Diagnosis not present

## 2021-02-01 DIAGNOSIS — I50810 Right heart failure, unspecified: Secondary | ICD-10-CM | POA: Diagnosis not present

## 2021-02-01 DIAGNOSIS — I8501 Esophageal varices with bleeding: Secondary | ICD-10-CM | POA: Diagnosis not present

## 2021-02-01 DIAGNOSIS — K2100 Gastro-esophageal reflux disease with esophagitis, without bleeding: Secondary | ICD-10-CM | POA: Diagnosis not present

## 2021-02-01 DIAGNOSIS — D500 Iron deficiency anemia secondary to blood loss (chronic): Secondary | ICD-10-CM | POA: Diagnosis not present

## 2021-02-01 DIAGNOSIS — E440 Moderate protein-calorie malnutrition: Secondary | ICD-10-CM | POA: Diagnosis not present

## 2021-02-01 DIAGNOSIS — I82401 Acute embolism and thrombosis of unspecified deep veins of right lower extremity: Secondary | ICD-10-CM | POA: Diagnosis not present

## 2021-02-01 DIAGNOSIS — I2721 Secondary pulmonary arterial hypertension: Secondary | ICD-10-CM | POA: Diagnosis not present

## 2021-02-01 MED ORDER — SODIUM CHLORIDE 0.9 % IV SOLN
Freq: Once | INTRAVENOUS | Status: AC
  Filled 2021-02-01: qty 250

## 2021-02-01 MED ORDER — SODIUM CHLORIDE 0.9% FLUSH
3.0000 mL | Freq: Once | INTRAVENOUS | Status: AC | PRN
  Administered 2021-02-01: 3 mL
  Filled 2021-02-01: qty 10

## 2021-02-01 MED ORDER — SODIUM CHLORIDE 0.9 % IV SOLN
510.0000 mg | Freq: Once | INTRAVENOUS | Status: AC
  Administered 2021-02-01: 510 mg via INTRAVENOUS
  Filled 2021-02-01: qty 17

## 2021-02-01 MED ORDER — HEPARIN SOD (PORK) LOCK FLUSH 100 UNIT/ML IV SOLN
250.0000 [IU] | Freq: Once | INTRAVENOUS | Status: AC | PRN
  Administered 2021-02-01: 250 [IU]
  Filled 2021-02-01: qty 5

## 2021-02-01 NOTE — Patient Instructions (Signed)

## 2021-02-01 NOTE — Progress Notes (Signed)
Patient was observed for 30 minutes post infusion, she tolerated infusion well with no complaints. Vitals stable upon discharge of infusion clinic.

## 2021-02-02 ENCOUNTER — Other Ambulatory Visit: Payer: Self-pay

## 2021-02-02 ENCOUNTER — Telehealth: Payer: Self-pay | Admitting: Plastic Surgery

## 2021-02-02 ENCOUNTER — Other Ambulatory Visit: Payer: Self-pay | Admitting: Internal Medicine

## 2021-02-02 ENCOUNTER — Telehealth (HOSPITAL_COMMUNITY): Payer: Self-pay | Admitting: Pharmacy Technician

## 2021-02-02 ENCOUNTER — Other Ambulatory Visit (HOSPITAL_COMMUNITY): Payer: Self-pay | Admitting: *Deleted

## 2021-02-02 ENCOUNTER — Telehealth (HOSPITAL_COMMUNITY): Payer: Self-pay | Admitting: *Deleted

## 2021-02-02 ENCOUNTER — Encounter (HOSPITAL_COMMUNITY): Payer: Self-pay | Admitting: Plastic Surgery

## 2021-02-02 DIAGNOSIS — T148XXA Other injury of unspecified body region, initial encounter: Secondary | ICD-10-CM

## 2021-02-02 DIAGNOSIS — M341 CR(E)ST syndrome: Secondary | ICD-10-CM

## 2021-02-02 DIAGNOSIS — I739 Peripheral vascular disease, unspecified: Secondary | ICD-10-CM

## 2021-02-02 MED ORDER — SILDENAFIL CITRATE 20 MG PO TABS: 60.0000 mg | ORAL_TABLET | Freq: Three times a day (TID) | ORAL | 3 refills | Status: AC

## 2021-02-02 NOTE — Progress Notes (Signed)
[name] [name] denies chest pain or shortness of breath.  Patient denies s/s of Covid in anyone in her home and has not been exposed to her knowledge. [name] [name] will have Covid test on 01/03/21 and is aware that she needs to quarantine with just those that live in the home.  [name] [name] has Pulmonary Hypertension and has been followed by Dr. Arman Filter, patient has not seen Dr. Francena Hanly in several. I will ask anesthesia to review.  I see that a call from Dr. Keane Scrape office has been sent to Dr. [name].

## 2021-02-02 NOTE — Telephone Encounter (Signed)
Spoke with patient's husband, he was able to get the patient a TAF grant to cover her [place] medications for one month. After any requested documents are provided and sent in, the patient will be approved through the remainder of 2022.  Advised him that we could send her Sildenafil to the specialty pharmacy, the grant will cover that medication as well.   Charlann Boxer, CPhT

## 2021-02-02 NOTE — Telephone Encounter (Signed)
Left voicemail for the triage nurse for Dr. [name] asking for an update on the surgery clearance for this patient. I left my direct contact number, [phone], and requested a call back as soon as possible.

## 2021-02-02 NOTE — Telephone Encounter (Signed)
Received fax from [facility], pt needs debridement of RLE wound w/general anesthesia by Dr [name].  Per Dr [name]: "pt cleared for surgery, ok to hold Plavix 5 days prior"  Form faxed back to them at [phone]

## 2021-02-03 ENCOUNTER — Other Ambulatory Visit (HOSPITAL_COMMUNITY)
Admission: RE | Admit: 2021-02-03 | Discharge: 2021-02-03 | Disposition: A | Discharge status: Home / Self Care | Payer: Medicare Other | Source: Ambulatory Visit | Attending: Plastic Surgery | Admitting: Plastic Surgery

## 2021-02-03 ENCOUNTER — Encounter (HOSPITAL_COMMUNITY): Payer: Self-pay | Admitting: Physician Assistant

## 2021-02-03 ENCOUNTER — Encounter (HOSPITAL_COMMUNITY): Payer: Self-pay

## 2021-02-03 ENCOUNTER — Encounter: Payer: Self-pay | Admitting: Plastic Surgery

## 2021-02-03 DIAGNOSIS — Z20822 Contact with and (suspected) exposure to covid-19: Secondary | ICD-10-CM | POA: Diagnosis not present

## 2021-02-03 DIAGNOSIS — Z01812 Encounter for preprocedural laboratory examination: Secondary | ICD-10-CM | POA: Insufficient documentation

## 2021-02-03 LAB — SARS CORONAVIRUS 2 (TAT 6-24 HRS): SARS Coronavirus 2: NEGATIVE

## 2021-02-03 NOTE — Progress Notes (Signed)
Anesthesia Chart Review: Same day workup  Follows with cardiologist Dr. 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  She was unable to tolerate IV epoprostenol, Tyvaso, or selexipag.  She remains on Revatio and macitentan.  Last seen 08/16/2020, at that time note states that she was stable NYHA III on macitentan 10 and Revatio 60 3 times daily.  Last echo March 21 showed LVEF 65%, RV mildly dilated but function normal.  Is on Plavix for history of PAD. History of bilateral femoral endarterectomies by Dr. [name].  She has also had laser ablation of her right and left greater saphenous veins by Dr. [name].  She had angioplasty of her left tibioperoneal trunk and anterior tibial artery by Dr. Trula Slade October 2021.  She had angioplasty of her right anterior tibial tibioperoneal trunk and superficial femoral artery by Dr. Trula Slade also in October 2021.  Per telephone encounter 02/02/2021 Dr. [name] cleared patient for surgery.  Per note, "Per Dr [name]: "pt cleared for surgery, ok to hold Plavix 5 days prior""  Follows with pulmonologist Dr. 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  She is on supplemental oxygen 2 to 3 L/min at all times.  She is on prednisone 5 mg daily for autoimmune disease.  CMP and CBC from 01/26/2021 reviewed.  Chronic anemia with hemoglobin 9.3 (recent baseline around 9.0).  Platelets mildly elevated at 475.  Labs otherwise unremarkable.  She will need day of surgery evaluation.  TTE 03/17/2020: 1. Left ventricular ejection fraction, by estimation, is 60 to 65%. The  left ventricle has normal function. The left ventricle has no regional  wall motion abnormalities. There is moderate concentric left ventricular  hypertrophy. Left ventricular  diastolic parameters are consistent with Grade I diastolic dysfunction  (impaired relaxation). Elevated left ventricular end-diastolic pressure.  The average  left ventricular global longitudinal strain is -17.7 %.  2. Right ventricular systolic function is normal. The right ventricular  size is normal. There is normal pulmonary artery systolic pressure.  3. Left atrial size was mildly dilated.  4. Right atrial size was mildly dilated.  5. The mitral valve is normal in structure. Trivial mitral valve  regurgitation. No evidence of mitral stenosis.  6. The aortic valve is tricuspid. Aortic valve regurgitation is not  visualized. No aortic stenosis is present.  7. Aortic dilatation noted. There is mild dilatation at the level of the  sinuses of Valsalva measuring 36 mm.  8. The inferior vena cava is dilated in size with >50% respiratory  variability, suggesting right atrial pressure of 8 mmHg.   Right heart cath 04/19/2017: Findings:  RA = 4 RV = 80/8 PA = 79/17 (43) PCW = 6 Fick cardiac output/index = 4.1/2.4 PVR = 9.0 WU Ao sat = 94% PA sat = 59%, 62%  Assessment:  1. Moderate PAH   Plan/Discussion:  She has persistent moderate PAH with elevated PVR and relatively preserved RV function. WI have suggested adding selexipag. She will discuss with her husband.     [name] [name] [facility]/Anesthesiology Phone [phone] 02/03/2021 11:56 AM

## 2021-02-03 NOTE — Anesthesia Preprocedure Evaluation (Deleted)
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 On O2 and prednisone chronically          Cardiovascular hypertension, Pt. on medications + Peripheral Vascular Disease (on Plavix) and + DOE    Pulmonary HTN  Atrial fibrillation Ventricular premature complex Right bundle branch block   Neuro/Psych PSYCHIATRIC DISORDERS Anxiety Depression negative neurological ROS     GI/Hepatic PUD,   Endo/Other  Hypothyroidism   Renal/GU Renal Insufficiency and CRFRenal disease     Musculoskeletal  (+) Arthritis , steroids,    Abdominal   Peds  Hematology  (+) anemia ,   Anesthesia Other Findings scleroderma  Reproductive/Obstetrics                            Anesthesia Physical Anesthesia Plan  ASA:   Anesthesia Plan:    Post-op Pain Management:    Induction:   PONV Risk Score and Plan:   Airway Management Planned:   Additional Equipment:   Intra-op Plan:   Post-operative Plan:   Informed Consent:   Plan Discussed with:   Anesthesia Plan Comments: (PAT note by Karoline Caldwell, PA-C: Follows with cardiologist Dr. 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  She was unable to tolerate IV epoprostenol, Tyvaso, or selexipag.  She remains on Revatio and macitentan.  Last seen 08/16/2020, at that time note states that she was stable NYHA III on macitentan 10 and Revatio 60 3 times daily.  Last echo March 21 showed LVEF 65%, RV mildly dilated but function normal.  Is on Plavix for history of PAD. History of bilateral femoral endarterectomies by Dr. [name].  She has also had laser ablation of her right and left greater saphenous veins by Dr. [name].  She had angioplasty of her left tibioperoneal trunk and anterior tibial artery by Dr. Trula Slade October 2021.  She had angioplasty of her right anterior tibial tibioperoneal trunk and  superficial femoral artery by Dr. Trula Slade also in October 2021.  Per telephone encounter 02/02/2021 Dr. [name] cleared patient for surgery.  Per note, "Per Dr [name]: "pt cleared for surgery, ok to hold Plavix 5 days prior""  Follows with pulmonologist Dr. 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  She is on supplemental oxygen 2 to 3 L/min at all times.  She is on prednisone 5 mg daily for autoimmune disease.  CMP and CBC from 01/26/2021 reviewed.  Chronic anemia with hemoglobin 9.3 (recent baseline around 9.0).  Platelets mildly elevated at 475.  Labs otherwise unremarkable.  She will need day of surgery evaluation.  TTE 03/17/2020: 1. Left ventricular ejection fraction, by estimation, is 60 to 65%. The  left ventricle has normal function. The left ventricle has no regional  wall motion abnormalities. There is moderate concentric left ventricular  hypertrophy. Left ventricular  diastolic parameters are consistent with Grade I diastolic dysfunction  (impaired relaxation). Elevated left ventricular end-diastolic pressure.  The average left ventricular global longitudinal strain is -17.7 %.  2. Right ventricular systolic function is normal. The right ventricular  size is normal. There is normal pulmonary artery systolic pressure.  3. Left atrial size was mildly dilated.  4. Right atrial size was mildly dilated.  5. The mitral valve is normal in structure. Trivial mitral valve  regurgitation. No evidence of mitral stenosis.  6. The aortic valve is tricuspid.  Aortic valve regurgitation is not  visualized. No aortic stenosis is present.  7. Aortic dilatation noted. There is mild dilatation at the level of the  sinuses of Valsalva measuring 36 mm.  8. The inferior vena cava is dilated in size with >50% respiratory  variability, suggesting right atrial pressure of 8 mmHg.   Right heart cath 04/19/2017: Findings:  RA = 4 RV =  80/8 PA = 79/17 (43) PCW = 6 Fick cardiac output/index = 4.1/2.4 PVR = 9.0 WU Ao sat = 94% PA sat = 59%, 62%  Assessment:  1. Moderate PAH   Plan/Discussion:  She has persistent moderate PAH with elevated PVR and relatively preserved RV function. WI have suggested adding selexipag. She will discuss with her husband.   )        Anesthesia Quick Evaluation

## 2021-02-05 ENCOUNTER — Other Ambulatory Visit: Payer: Self-pay

## 2021-02-05 ENCOUNTER — Emergency Department (HOSPITAL_BASED_OUTPATIENT_CLINIC_OR_DEPARTMENT_OTHER)
Admission: EM | Admit: 2021-02-05 | Discharge: 2021-02-05 | Disposition: A | Discharge status: Home / Self Care | Payer: Medicare Other | Source: Home / Self Care | Attending: Emergency Medicine | Admitting: Emergency Medicine

## 2021-02-05 ENCOUNTER — Emergency Department (HOSPITAL_BASED_OUTPATIENT_CLINIC_OR_DEPARTMENT_OTHER): Payer: Medicare Other

## 2021-02-05 ENCOUNTER — Encounter (HOSPITAL_BASED_OUTPATIENT_CLINIC_OR_DEPARTMENT_OTHER): Payer: Self-pay | Admitting: Emergency Medicine

## 2021-02-05 DIAGNOSIS — I6503 Occlusion and stenosis of bilateral vertebral arteries: Secondary | ICD-10-CM | POA: Diagnosis not present

## 2021-02-05 DIAGNOSIS — E039 Hypothyroidism, unspecified: Secondary | ICD-10-CM | POA: Insufficient documentation

## 2021-02-05 DIAGNOSIS — R5383 Other fatigue: Secondary | ICD-10-CM | POA: Diagnosis not present

## 2021-02-05 DIAGNOSIS — H748X2 Other specified disorders of left middle ear and mastoid: Secondary | ICD-10-CM | POA: Diagnosis not present

## 2021-02-05 DIAGNOSIS — I6389 Other cerebral infarction: Secondary | ICD-10-CM | POA: Diagnosis not present

## 2021-02-05 DIAGNOSIS — I509 Heart failure, unspecified: Secondary | ICD-10-CM | POA: Insufficient documentation

## 2021-02-05 DIAGNOSIS — I21A1 Myocardial infarction type 2: Secondary | ICD-10-CM | POA: Diagnosis not present

## 2021-02-05 DIAGNOSIS — N183 Chronic kidney disease, stage 3 unspecified: Secondary | ICD-10-CM | POA: Insufficient documentation

## 2021-02-05 DIAGNOSIS — Z79899 Other long term (current) drug therapy: Secondary | ICD-10-CM | POA: Insufficient documentation

## 2021-02-05 DIAGNOSIS — I5032 Chronic diastolic (congestive) heart failure: Secondary | ICD-10-CM | POA: Diagnosis not present

## 2021-02-05 DIAGNOSIS — R410 Disorientation, unspecified: Secondary | ICD-10-CM | POA: Insufficient documentation

## 2021-02-05 DIAGNOSIS — N179 Acute kidney failure, unspecified: Secondary | ICD-10-CM | POA: Diagnosis not present

## 2021-02-05 DIAGNOSIS — N3000 Acute cystitis without hematuria: Secondary | ICD-10-CM | POA: Diagnosis not present

## 2021-02-05 DIAGNOSIS — I672 Cerebral atherosclerosis: Secondary | ICD-10-CM | POA: Diagnosis not present

## 2021-02-05 DIAGNOSIS — R531 Weakness: Secondary | ICD-10-CM | POA: Diagnosis not present

## 2021-02-05 DIAGNOSIS — I130 Hypertensive heart and chronic kidney disease with heart failure and stage 1 through stage 4 chronic kidney disease, or unspecified chronic kidney disease: Secondary | ICD-10-CM | POA: Insufficient documentation

## 2021-02-05 DIAGNOSIS — Z955 Presence of coronary angioplasty implant and graft: Secondary | ICD-10-CM | POA: Insufficient documentation

## 2021-02-05 DIAGNOSIS — I6782 Cerebral ischemia: Secondary | ICD-10-CM | POA: Diagnosis not present

## 2021-02-05 DIAGNOSIS — I517 Cardiomegaly: Secondary | ICD-10-CM | POA: Diagnosis not present

## 2021-02-05 DIAGNOSIS — R4789 Other speech disturbances: Secondary | ICD-10-CM | POA: Diagnosis not present

## 2021-02-05 DIAGNOSIS — I6523 Occlusion and stenosis of bilateral carotid arteries: Secondary | ICD-10-CM | POA: Diagnosis not present

## 2021-02-05 DIAGNOSIS — R2681 Unsteadiness on feet: Secondary | ICD-10-CM

## 2021-02-05 DIAGNOSIS — R479 Unspecified speech disturbances: Secondary | ICD-10-CM

## 2021-02-05 DIAGNOSIS — R4182 Altered mental status, unspecified: Secondary | ICD-10-CM | POA: Diagnosis not present

## 2021-02-05 DIAGNOSIS — G319 Degenerative disease of nervous system, unspecified: Secondary | ICD-10-CM | POA: Diagnosis not present

## 2021-02-05 DIAGNOSIS — Z20822 Contact with and (suspected) exposure to covid-19: Secondary | ICD-10-CM | POA: Diagnosis not present

## 2021-02-05 DIAGNOSIS — J849 Interstitial pulmonary disease, unspecified: Secondary | ICD-10-CM | POA: Diagnosis not present

## 2021-02-05 DIAGNOSIS — R29818 Other symptoms and signs involving the nervous system: Secondary | ICD-10-CM | POA: Diagnosis not present

## 2021-02-05 DIAGNOSIS — I10 Essential (primary) hypertension: Secondary | ICD-10-CM | POA: Diagnosis not present

## 2021-02-05 LAB — COMPREHENSIVE METABOLIC PANEL
ALT: 11 U/L (ref 0–44)
AST: 18 U/L (ref 15–41)
Albumin: 2.7 g/dL — ABNORMAL LOW (ref 3.5–5.0)
Alkaline Phosphatase: 71 U/L (ref 38–126)
Anion gap: 12 (ref 5–15)
BUN: 31 mg/dL — ABNORMAL HIGH (ref 8–23)
CO2: 25 mmol/L (ref 22–32)
Calcium: 8.4 mg/dL — ABNORMAL LOW (ref 8.9–10.3)
Chloride: 97 mmol/L — ABNORMAL LOW (ref 98–111)
Creatinine, Ser: 1.23 mg/dL — ABNORMAL HIGH (ref 0.44–1.00)
GFR, Estimated: 47 mL/min — ABNORMAL LOW (ref >60)
Glucose, Bld: 112 mg/dL — ABNORMAL HIGH (ref 70–99)
Potassium: 3.3 mmol/L — ABNORMAL LOW (ref 3.5–5.1)
Sodium: 134 mmol/L — ABNORMAL LOW (ref 135–145)
Total Bilirubin: 0.2 mg/dL — ABNORMAL LOW (ref 0.3–1.2)
Total Protein: 6.7 g/dL (ref 6.5–8.1)

## 2021-02-05 LAB — CBC
HCT: 28.8 % — ABNORMAL LOW (ref 36.0–46.0)
Hemoglobin: 9.2 g/dL — ABNORMAL LOW (ref 12.0–15.0)
MCH: 31.3 pg (ref 26.0–34.0)
MCHC: 31.9 g/dL (ref 30.0–36.0)
MCV: 98 fL (ref 80.0–100.0)
Platelets: 291 10*3/uL (ref 150–400)
RBC: 2.94 MIL/uL — ABNORMAL LOW (ref 3.87–5.11)
RDW: 18 % — ABNORMAL HIGH (ref 11.5–15.5)
WBC: 11.1 10*3/uL — ABNORMAL HIGH (ref 4.0–10.5)
nRBC: 0 % (ref 0.0–0.2)

## 2021-02-05 LAB — URINALYSIS, MICROSCOPIC (REFLEX)

## 2021-02-05 LAB — URINALYSIS, ROUTINE W REFLEX MICROSCOPIC
Bilirubin Urine: NEGATIVE
Glucose, UA: NEGATIVE mg/dL
Hgb urine dipstick: NEGATIVE
Ketones, ur: NEGATIVE mg/dL
Leukocytes,Ua: NEGATIVE
Nitrite: POSITIVE — AB
Protein, ur: NEGATIVE mg/dL
Specific Gravity, Urine: 1.01 (ref 1.005–1.030)
pH: 6.5 (ref 5.0–8.0)

## 2021-02-05 LAB — TSH: TSH: 1.593 u[IU]/mL (ref 0.350–4.500)

## 2021-02-05 MED ORDER — IOHEXOL 350 MG/ML SOLN
100.0000 mL | Freq: Once | INTRAVENOUS | Status: AC | PRN
  Administered 2021-02-05: 100 mL via INTRAVENOUS

## 2021-02-05 NOTE — ED Triage Notes (Signed)
Pt arrives pov with family member, with c/o confusion. Pts family member endorses pt taking abx that in the past has altered patients electrolytes. Pt on portable o2 tank, 3L. Pt scheduled for surgery tomorrow. AO x 4, but slow to answer questions. Pt denies numbness, bilateral equal.

## 2021-02-05 NOTE — ED Notes (Signed)
Assisted to use bedside commode.  Tolerated well with two person assist.

## 2021-02-05 NOTE — ED Provider Notes (Signed)
[facility] EMERGENCY DEPARTMENT Provider Note   CSN: [id number] Arrival date & time: 02/05/21  1441     History Chief Complaint  Patient presents with  . Altered Mental Status    [name] [name] is a 70 y.o. female.  The history is provided by the patient and medical records. No language interpreter was used.  Neurologic Problem This is a new problem. The current episode started yesterday. The problem occurs constantly. The problem has not changed since onset.Pertinent negatives include no chest pain, no abdominal pain, no headaches and no shortness of breath. Nothing aggravates the symptoms. Nothing relieves the symptoms. She has tried nothing for the symptoms. The treatment provided no relief.       Past Medical History:  Diagnosis Date  . Anemia   . Arthritis   . DOE (dyspnea on exertion)   . Epistaxis   . History of chicken pox   . Hypertension   . Hypothyroidism   . Leg ulcer ([place])   . Melanoma ([place]) 1999  . 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[place])   . Non Hodgkin's lymphoma ([place])    Treated with chemo 2010, felt to be cured.  Marland Kitchen PAH (pulmonary artery hypertension) ([place])   . Peripheral arterial occlusive disease ([place])   . Pneumonia 2008  . Pulmonary hypertension ([place])   . Requires supplemental oxygen    2 liters-3liters when moving  . 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 Iron deficiency anemia due to chronic blood loss 01/26/2021  . Anemia due to chronic blood loss 01/18/2021  . Anxiety state 01/18/2021  . 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 Disorder of calcium metabolism 01/18/2021  . Gastro-esophageal reflux disease with esophagitis, without bleeding 01/18/2021  . Oxygen dependent 01/18/2021  . Ulcer of esophagus with bleeding 01/18/2021  . Gangrene of finger of right hand ([place]) 11/29/2020  . Malnutrition of moderate degree 11/22/2020  . GI bleed 11/21/2020  . Eustachian tube dysfunction, bilateral  07/29/2020  . 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 Wrist fracture 11/24/2019  . Multiple closed stable fractures of pubic ramus ([place]) 11/23/2019  . Distal radius fracture, left 11/23/2019  . Fall at home, initial encounter 11/23/2019  . AMS (altered mental status) 11/12/2019  . Altered mental status 11/11/2019  . Hypomagnesemia 11/11/2019  . Osteoporosis 01/22/2019  . Hypothyroidism 06/16/2018  . Pulmonary hypertension associated with systemic disorder ([place]) 10/08/2017  . 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 Dry gangrene ([place]) 09/11/2017  . Medication reaction 09/11/2017  . ILD (interstitial lung disease) ([place]) 07/22/2017  . Nasal septal perforation 07/10/2017  . Chronic respiratory failure ([place]) 10/31/2016  . Atherosclerosis of native arteries of right leg with ulceration of unspecified site ([place]) 09/14/2016  . Prolonged Q-T interval on ECG 07/29/2016  . CKD (chronic kidney disease), stage III ([place]) 07/29/2016  . Scleroderma ([place]) 05/06/2016  . Depression 04/24/2016  . Extensor tenosynovitis of wrist, right 02/24/2016  . 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 Hypokalemia 10/07/2015  . 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[place]) 09/01/2015  . Allergic rhinitis 03/08/2015  . High risk medication use 04/09/2013  . Venous hypertension, chronic, with ulcer and inflammation ([place]) 01/29/2013  . Lymphoma, non-Hodgkin's ([place]) 01/27/2013  . Right heart failure ([place]) 01/27/2013  . Varicose veins of lower extremities with ulcer and inflammation ([place]) 07/24/2012  .  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[place]) 04/08/2012  . Pulmonary hypertension associated with systemic disorder ([place]) 03/25/2012  . Peripheral arterial occlusive disease ([place]) 08/10/2011  . Essential hypertension 08/10/2011  . Hyperlipidemia 08/10/2011  . 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 ABDOMINAL AORTOGRAM W/LOWER EXTREMITY Bilateral 10/04/2020   Procedure: ABDOMINAL AORTOGRAM W/LOWER EXTREMITY;  Surgeon: Serafina Mitchell, MD;  Location: [facility];  Service: Cardiovascular;  Laterality: Bilateral;  . ABDOMINAL AORTOGRAM W/LOWER EXTREMITY Bilateral 01/02/2021   Procedure: ABDOMINAL AORTOGRAM W/LOWER EXTREMITY;  Surgeon: Waynetta Sandy, MD;  Location: [facility];  Service: Cardiovascular;  Laterality: Bilateral;  . AMPUTATION Right 10/24/2017   Procedure: RAY resection right index;  Surgeon: Daryll Brod, MD;  Location: [facility];  Service: Orthopedics;  Laterality: Right;  Axillary block  . AMPUTATION Right 11/21/2018   Procedure: AMPUTATION RAY RIGHT MIDDLE FINGER;  Surgeon: Daryll Brod, MD;  Location: [facility];  Service: Orthopedics;  Laterality: Right;  . AMPUTATION Right 10/22/2019   Procedure: AMPUTATION DIGIT RIGHT RING AND SMALL FINGER, DIGITAL SYMPATHECTOMY RIGHT RADIAL ULNAR ARCH;  Surgeon: Daryll Brod, MD;  Location: [place];  Service: Orthopedics;  Laterality: Right;  AXILARY  . apligraph  12-04-2102,   12-18-2012   bilateral calves   [name]  . biopsy on cerebellum    . COLONOSCOPY WITH PROPOFOL Left 10/07/2015   Procedure: COLONOSCOPY WITH PROPOFOL;  Surgeon: [name], MD;  Location: [facility];  Service: Endoscopy;  Laterality: Left;  . ENDARTERECTOMY FEMORAL Right 09/14/2016   Procedure: ENDARTERECTOMY FEMORAL RIGHT;  Surgeon: [name], MD;  Location: [facility] OR;  Service: Vascular;  Laterality: Right;  . ENDOVENOUS ABLATION SAPHENOUS VEIN W/ LASER  07-24-2012   right greater saphenous vein by [name], M.D.   . ENDOVENOUS ABLATION SAPHENOUS VEIN W/ LASER  10-16-2012   left greater saphenous vein  by Dr. [name]  . ENDOVENOUS ABLATION SAPHENOUS VEIN W/ LASER Right 09-01-2015   endovenous laser ablation right greater saphenous vein by  [name] MD  . ESOPHAGOGASTRODUODENOSCOPY N/A 05/07/2016   Procedure: ESOPHAGOGASTRODUODENOSCOPY (EGD);  Surgeon: [name], MD;  [facility];  Service: Endoscopy;  Laterality: N/A;  . ESOPHAGOGASTRODUODENOSCOPY N/A 11/23/2020   Procedure: ESOPHAGOGASTRODUODENOSCOPY (EGD);  Surgeon: [name], MD;  Location: [facility];  Service: Endoscopy;  Laterality: N/A;  . ESOPHAGOGASTRODUODENOSCOPY (EGD) WITH PROPOFOL Left 10/07/2015   Procedure: ESOPHAGOGASTRODUODENOSCOPY (EGD) WITH PROPOFOL;  Surgeon: [name], MD;  Location: [facility];  Service: Endoscopy;  Laterality: Left;  . GIVENS CAPSULE STUDY N/A 07/31/2016   Procedure: GIVENS CAPSULE STUDY;  Surgeon: [name], MD;  Location: [facility] ENDOSCOPY;  Service: Endoscopy;  Laterality: N/A;  . GIVENS CAPSULE STUDY N/A 11/23/2020   Procedure: GIVENS CAPSULE STUDY;  Surgeon: [name], MD;  Location: [facility] ENDOSCOPY;  Service: Endoscopy;  Laterality: N/A;  . IR IVC FILTER PLMT / S&I Burke Keels GUID/MOD SED  11/22/2020  . LOWER EXTREMITY ANGIOGRAPHY N/A 10/25/2020   Procedure: LOWER EXTREMITY ANGIOGRAPHY - Left;  Surgeon: Serafina Mitchell, MD;  Location: [facility];  Service: Cardiovascular;  Laterality: N/A;  . melonoma removes Right    behind right knee  . OPEN REDUCTION INTERNAL FIXATION (ORIF) DISTAL RADIAL FRACTURE Left 11/24/2019   Procedure: OPEN REDUCTION INTERNAL FIXATION (ORIF) DISTAL RADIAL FRACTURE;  Surgeon: Altamese Roseburg, MD;  Location: [facility];  Service: Orthopedics;  Laterality: Left;  . PATCH ANGIOPLASTY  Right 09/14/2016   Procedure: PATCH ANGIOPLASTY RIGHT FEMORAL ARTERY USING HEMASHIELD PLATINUM FINESSE PATCH;  Surgeon: [name], MD;  Location: [place];  Service: Vascular;  Laterality: Right;  . PERIPHERAL VASCULAR BALLOON ANGIOPLASTY Right 10/04/2020   Procedure: PERIPHERAL VASCULAR BALLOON ANGIOPLASTY;  Surgeon: Serafina Mitchell, MD;  Location: [facility];  Service: Cardiovascular;  Laterality: Right;   SFA/PT/AT/PTtrunk  . PERIPHERAL VASCULAR BALLOON ANGIOPLASTY Left 10/25/2020   Procedure: PERIPHERAL VASCULAR BALLOON ANGIOPLASTY;  Surgeon: Serafina Mitchell, MD;  Location: [facility];  Service: Cardiovascular;  Laterality: Left;  anterior tibial and tibioperoneal trunk  . PERIPHERAL VASCULAR CATHETERIZATION N/A 09/07/2016   Procedure: Abdominal Aortogram w/Lower Extremity;  Surgeon: [name], MD;  Location: [facility];  Service: Cardiovascular;  Laterality: N/A;  . RIGHT HEART CATH N/A 04/19/2017   Procedure: Right Heart Cath;  Surgeon: [name], MD;  Location: [facility];  Service: Cardiovascular;  Laterality: N/A;  . RIGHT HEART CATHETERIZATION N/A 11/14/2012   Procedure: RIGHT HEART CATH;  Surgeon: [name], MD;  Location: [facility] CATH LAB;  Service: Cardiovascular;  Laterality: N/A;  . rt little toe removal  10/1998  . stent left thigh  3/11  . TENDON TRANSFER Right 10/24/2017   Procedure: transfer extensor indecus propius/extensor digitorum commomus index to middle ring and small;  Surgeon: Daryll Brod, MD;  Location: [facility];  Service: Orthopedics;  Laterality: Right;  . TONSILLECTOMY    . TONSILLECTOMY AND ADENOIDECTOMY    . TUNNELED VENOUS CATHETER PLACEMENT  01/2013  . ULNAR HEAD EXCISION Right 10/24/2017   Procedure: darrach right ulna;  Surgeon: Daryll Brod, MD;  Location: [facility];  Service: Orthopedics;  Laterality: Right;  . VEIN LIGATION AND STRIPPING  05/16/2012   Procedure: VEIN LIGATION AND STRIPPING;  Surgeon: [name], MD;  Location: [facility] OR;  Service: Vascular;  Laterality: Right;  Irrigation and Debridement right lower leg, ligation of saphenous vein.     OB History   No obstetric history on file.     Family History  Problem Relation Age of Onset  . Lupus Father   . Lymphoma Father   . Hyperlipidemia Mother   . Cancer Daughter        sarcoma  . Colon cancer Other     Social History   Tobacco Use  . Smoking status:  Never Smoker  . Smokeless tobacco: Never Used  Vaping Use  . Vaping Use: Never used  Substance Use Topics  . Alcohol use: Yes    Alcohol/week: 7.0 standard drinks    Types: 3 Standard drinks or equivalent, 4 Glasses of wine per week  . Drug use: No    Home Medications Prior to Admission medications   Medication Sig Start Date End Date Taking? Authorizing Provider  acetaminophen (TYLENOL) 650 MG CR tablet Take 1,300 mg by mouth every 8 (eight) hours as needed for pain.    [provider]  Biotin 5000 MCG CAPS Take 5,000 mcg by mouth daily.    [provider]  Calcium Carbonate-Vit D-Min (CALCIUM 1200 PO) Take 1 tablet by mouth at bedtime.    [provider]  Cholecalciferol (VITAMIN D) 2000 units CAPS Take 2,000 Units by mouth at bedtime.    [provider]  citalopram (CELEXA) 40 MG tablet TAKE 1 TABLET BY MOUTH EVERY DAY Patient taking differently: Take 40 mg by mouth at bedtime. 01/13/21   [name], MD  clopidogrel (  PLAVIX) 75 MG tablet TAKE 1 TABLET BY MOUTH EVERY DAY Patient taking differently: Take 75 mg by mouth daily. 01/05/21   Bensimhon, Shaune Pascal, MD  cycloSPORINE (RESTASIS) 0.05 % ophthalmic emulsion Place 2 drops into both eyes 2 (two) times daily.    [provider]  FERREX 150 150 MG capsule TAKE 1 CAPSULE BY MOUTH TWICE A DAY Patient taking differently: Take 150 mg by mouth 2 (two) times daily. 01/05/21   [name], MD  fish oil-omega-3 fatty acids 1000 MG capsule Take 1,000 mg by mouth 2 (two) times daily.    [provider]  folic acid (FOLVITE) 1 MG tablet TAKE 1 TABLET BY MOUTH EVERY DAY Patient taking differently: Take 1 mg by mouth daily. 01/05/21   [name], MD  HYDROcodone-acetaminophen (NORCO) 5-325 MG tablet One tab by mouth with food sparingly up to 3 times daily as needed for leg wound pain Patient not taking: No sig reported 01/20/21   [name], MD  levothyroxine (SYNTHROID) 50 MCG tablet  TAKE 1 TABLET BY MOUTH DAILY BEFORE BREAKFAST Patient taking differently: Take 50 mcg by mouth daily before breakfast. 08/31/20   Baxley, Cresenciano Lick, MD  loperamide (IMODIUM) 2 MG capsule Take 2-4 mg by mouth 4 (four) times daily as needed for diarrhea or loose stools.    [provider]  MAGNESIUM ASPARTATE PO Take 150 mg by mouth in the morning and at bedtime.    [provider]  Multiple Vitamin (MULTIVITAMIN WITH MINERALS) TABS tablet Take 1 tablet by mouth daily at 12 noon.    [provider]  OPSUMIT 10 MG tablet Take 1 tablet (56m) by mouth daily. Patient taking differently: Take 10 mg by mouth daily. 04/29/20   Bensimhon, DShaune Pascal MD  OXYGEN Inhale 2-3 L into the lungs continuous.     [provider]  pantoprazole (PROTONIX) 40 MG tablet Take 1 tablet (40 mg total) by mouth 2 (two) times daily. 10/05/20   BElby Showers MD  potassium chloride 20 MEQ/15ML (10%) SOLN TAKE 2 TEASPOONFUL (DILUTED IN WATER) ONCE DAILY Patient taking differently: Take 20 mEq by mouth daily. 08/02/20   BElby Showers MD  predniSONE (DELTASONE) 5 MG tablet Take 5 mg by mouth daily. 04/04/20   [provider]  rosuvastatin (CRESTOR) 5 MG tablet Take 1 tablet every Monday, Wednesday, and Friday. Patient taking differently: Take 5 mg by mouth every Monday, Wednesday, and Friday. 08/29/20   Bensimhon, DShaune Pascal MD  sildenafil (REVATIO) 20 MG tablet Take 3 tablets (60 mg total) by mouth 3 (three) times daily. 02/02/21   Bensimhon, DShaune Pascal MD  spironolactone (ALDACTONE) 25 MG tablet TAKE 1 TABLET BY MOUTH EVERY DAY Patient taking differently: Take 25 mg by mouth daily. 01/09/21   Bensimhon, DShaune Pascal MD  torsemide (DEMADEX) 20 MG tablet Take 20-40 mg by mouth See admin instructions. Take 2 tablets (40 mg) by mouth in the morning & take 1 tablet (20 mg) by mouth at night.    [provider]  traMADol (ULTRAM) 50 MG tablet Take 50 mg by mouth every 6 (six) hours as needed for  moderate pain or severe pain. 11/28/20   [provider]  valACYclovir (VALTREX) 500 MG tablet Take 1 tablet (500 mg total) by mouth 2 (two) times daily. Patient taking differently: Take 500 mg by mouth daily as needed (lip sore). 09/02/19   BElby Showers MD  vitamin B-12 (CYANOCOBALAMIN) 1000 MCG tablet Take  1,000 mcg by mouth daily.    [provider]    Allergies    Levofloxacin, Sulfa antibiotics, Doxycycline hyclate, and Penicillins  Review of Systems   Review of Systems  Constitutional: Negative for chills, diaphoresis, fatigue and fever.  HENT: Negative for congestion.   Eyes: Negative for visual disturbance.  Respiratory: Negative for cough, chest tightness, shortness of breath and wheezing.   Cardiovascular: Negative for chest pain, palpitations and leg swelling.  Gastrointestinal: Negative for abdominal pain, constipation, diarrhea, nausea and vomiting.  Genitourinary: Negative for dysuria.  Musculoskeletal: Positive for gait problem (insteadiness). Negative for back pain, neck pain and neck stiffness.  Neurological: Positive for speech difficulty. Negative for dizziness, syncope, weakness, light-headedness, numbness and headaches.  Psychiatric/Behavioral: Negative for agitation.  All other systems reviewed and are negative.   Physical Exam Updated Vital Signs BP (!) 151/49 (BP Location: Right Arm)   Pulse (!) 104   Temp 98 F (36.7 C) (Oral)   Resp 18   Ht 5' 8" (1.727 m)   Wt 54 kg   SpO2 97%   BMI 18.09 kg/m   Physical Exam Vitals and nursing note reviewed.  Constitutional:      General: She is not in acute distress.    Appearance: She is well-developed and well-nourished. She is not ill-appearing, toxic-appearing or diaphoretic.  HENT:     Head: Normocephalic and atraumatic.     Nose: No congestion or rhinorrhea.     Mouth/Throat:     Mouth: Mucous membranes are moist.     Pharynx: No oropharyngeal exudate or posterior oropharyngeal  erythema.  Eyes:     Extraocular Movements: Extraocular movements intact.     Conjunctiva/sclera: Conjunctivae normal.     Pupils: Pupils are equal, round, and reactive to light.  Cardiovascular:     Rate and Rhythm: Normal rate and regular rhythm.     Heart sounds: No murmur heard.   Pulmonary:     Effort: Pulmonary effort is normal. No respiratory distress.     Breath sounds: Normal breath sounds.  Abdominal:     Palpations: Abdomen is soft.     Tenderness: There is no abdominal tenderness. There is no right CVA tenderness, left CVA tenderness, guarding or rebound.  Musculoskeletal:        General: No tenderness or edema.     Cervical back: Neck supple.     Comments: Right leg wrapped from knee down with wound dressing, recently changed.   Skin:    General: Skin is warm and dry.     Findings: No erythema.  Neurological:     Mental Status: She is alert. She is disoriented.     Cranial Nerves: No dysarthria or facial asymmetry.     Sensory: No sensory deficit.     Motor: No weakness, abnormal muscle tone or seizure activity.     Coordination: Coordination normal. Finger-Nose-Finger Test normal.     Comments: Aphasia.  No dysarthria.  Patient reported unsteadiness with gait so we did not try to ambulate her at this time.  Psychiatric:        Mood and Affect: Mood and affect and mood normal.     ED Results / Procedures / Treatments   Labs (all labs ordered are listed, but only abnormal results are displayed) Labs Reviewed  COMPREHENSIVE METABOLIC PANEL - Abnormal; Notable for the following components:      Result Value   Sodium 134 (*)    Potassium 3.3 (*)  Chloride 97 (*)    Glucose, Bld 112 (*)    BUN 31 (*)    Creatinine, Ser 1.23 (*)    Calcium 8.4 (*)    Albumin 2.7 (*)    Total Bilirubin 0.2 (*)    GFR, Estimated 47 (*)    All other components within normal limits  CBC - Abnormal; Notable for the following components:   WBC 11.1 (*)    RBC 2.94 (*)     Hemoglobin 9.2 (*)    HCT 28.8 (*)    RDW 18.0 (*)    All other components within normal limits  URINALYSIS, ROUTINE W REFLEX MICROSCOPIC - Abnormal; Notable for the following components:   Nitrite POSITIVE (*)    All other components within normal limits  URINALYSIS, MICROSCOPIC (REFLEX) - Abnormal; Notable for the following components:   Bacteria, UA FEW (*)    All other components within normal limits  URINE CULTURE  TSH    EKG EKG Interpretation  Date/Time:  Sunday February 05 2021 15:09:14 EST Ventricular Rate:  80 PR Interval:    QRS Duration: 153 QT Interval:  432 QTC Calculation: 499 R Axis:   106 Text Interpretation: Sinus  rhythm Atrial premature complexes Short PR interval Right bundle branch block Lateral infarct, old When compared to prior, faster and more irregular rate with RBBB. No STEMI Confirmed by Antony Blackbird [phone]) on 02/05/2021 4:47:48 PM   Radiology CT Angio Head W or Wo Contrast  Result Date: 02/05/2021 CLINICAL DATA:  Neuro deficit, acute stroke suspected.  New aphasia. EXAM: CT ANGIOGRAPHY HEAD AND NECK TECHNIQUE: Multidetector CT imaging of the head and neck was performed using the standard protocol during bolus administration of intravenous contrast. Multiplanar CT image reconstructions and MIPs were obtained to evaluate the vascular anatomy. Carotid stenosis measurements (when applicable) are obtained utilizing NASCET criteria, using the distal internal carotid diameter as the denominator. CONTRAST:  146m OMNIPAQUE IOHEXOL 350 MG/ML SOLN COMPARISON:  CT head December 14, 2020. CTA November 11, 2019 FINDINGS: CT HEAD FINDINGS Brain: High left remote parietal cortical infarct, similar to prior. Remote right cerebellar lacunar infarct. No evidence of acute large vascular territory infarct. Patchy white matter hypoattenuation, likely the sequela of chronic microvascular ischemic disease. No acute hemorrhage. No hydrocephalus. Similar mild diffuse cerebral  atrophy with ex vacuo ventricular dilation. No mass lesion or abnormal mass effect. Skull: No acute fracture. Prior right occipital craniectomy with cranioplasty. Sinuses: Sinuses are largely clear. Orbits: No acute findings. Review of the MIP images confirms the above findings CTA NECK FINDINGS Aortic arch: Calcific atherosclerosis. Great vessel origins are patent. Right carotid system: Predominately calcific atherosclerosis at the carotid bifurcation without evidence of greater than 50% stenosis. Left carotid system: Predominately calcific atherosclerosis at the carotid bifurcation without evidence of greater than 50% stenosis. Vertebral arteries: Mild atherosclerotic narrowing of bilateral vertebral artery origins. Mild to moderate stenosis of the distal right vertebral artery. Otherwise, no significant stenosis. Codominant. 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 Other neck: No suspicious mass or adenopathy Upper chest: Partially imaged diffuse nodular and ground-glass opacities in the lung apices, possibly slightly progressed since prior CT chest from 01/21/2019 and further characterized on that study. Esophageal dilation with debris within in the partially visualized lower esophagus Review of the MIP images confirms the above findings CTA HEAD FINDINGS Anterior circulation: Mild-to-moderate bilateral cavernous carotid narrowing secondary to predominately calcific atherosclerosis. Otherwise,  no significant stenosis, proximal occlusion, aneurysm, or vascular malformation. Posterior circulation: Mild to moderate stenosis of the distal right vertebral artery. Otherwise, no significant stenosis. No large vessel occlusion. No aneurysm. Venous sinuses: As permitted by contrast timing, patent. Review of the MIP images confirms the above findings IMPRESSION: CT head: 1. No evidence of acute  intracranial abnormality. 2. 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 3. Chronic microvascular ischemic disease. CTA: 1. No emergent large vessel occlusion. 2. Similar mild to moderate bilateral cavernous carotid atherosclerosis. 3. Similar mild to moderate distal right vertebral artery stenosis. 4. Partially imaged diffuse nodular and ground-glass opacities in the lung apices, possibly slightly progressed since prior CT chest from 01/21/2019 and further characterized on that study. A repeat high-resolution CT of the chest could further evaluate if clinically indicated. 5. Esophageal dilation with debris within in the partially visualized lower esophagus, compatible with history of scleroderma. This places the patient at risk for aspiration. Electronically Signed   By: Margaretha Sheffield MD   On: 02/05/2021 19:09   CT Angio Neck W and/or Wo Contrast  Result Date: 02/05/2021 CLINICAL DATA:  Neuro deficit, acute stroke suspected.  New aphasia. EXAM: CT ANGIOGRAPHY HEAD AND NECK TECHNIQUE: Multidetector CT imaging of the head and neck was performed using the standard protocol during bolus administration of intravenous contrast. Multiplanar CT image reconstructions and MIPs were obtained to evaluate the vascular anatomy. Carotid stenosis measurements (when applicable) are obtained utilizing NASCET criteria, using the distal internal carotid diameter as the denominator. CONTRAST:  126m OMNIPAQUE IOHEXOL 350 MG/ML SOLN COMPARISON:  CT head December 14, 2020. CTA November 11, 2019 FINDINGS: CT HEAD FINDINGS Brain: High left remote parietal cortical infarct, similar to prior. Remote right cerebellar lacunar infarct. No evidence of acute large vascular territory infarct. Patchy white matter hypoattenuation, likely the sequela of chronic microvascular ischemic disease. No acute hemorrhage. No hydrocephalus. Similar mild diffuse cerebral atrophy with ex vacuo ventricular  dilation. No mass lesion or abnormal mass effect. Skull: No acute fracture. Prior right occipital craniectomy with cranioplasty. Sinuses: Sinuses are largely clear. Orbits: No acute findings. Review of the MIP images confirms the above findings CTA NECK FINDINGS Aortic arch: Calcific atherosclerosis. Great vessel origins are patent. Right carotid system: Predominately calcific atherosclerosis at the carotid bifurcation without evidence of greater than 50% stenosis. Left carotid system: Predominately calcific atherosclerosis at the carotid bifurcation without evidence of greater than 50% stenosis. Vertebral arteries: Mild atherosclerotic narrowing of bilateral vertebral artery origins. Mild to moderate stenosis of the distal right vertebral artery. Otherwise, no significant stenosis. Codominant. 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 Other neck: No suspicious mass or adenopathy Upper chest: Partially imaged diffuse nodular and ground-glass opacities in the lung apices, possibly slightly progressed since prior CT chest from 01/21/2019 and further characterized on that study. Esophageal dilation with debris within in the partially visualized lower esophagus Review of the MIP images confirms the above findings CTA HEAD FINDINGS Anterior circulation: Mild-to-moderate bilateral cavernous carotid narrowing secondary to predominately calcific atherosclerosis. Otherwise, no significant stenosis, proximal occlusion, aneurysm, or vascular malformation. Posterior circulation: Mild to moderate stenosis of the distal right vertebral artery. Otherwise, no significant stenosis. No large vessel occlusion. No aneurysm. Venous sinuses: As permitted by contrast timing, patent. Review of the MIP images confirms the above findings IMPRESSION: CT head: 1. No evidence of acute intracranial abnormality. 2. 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 3. Chronic microvascular ischemic disease. CTA: 1. No emergent large vessel occlusion. 2. Similar mild to moderate bilateral cavernous carotid atherosclerosis. 3. Similar mild to moderate distal right vertebral artery stenosis. 4. Partially imaged diffuse nodular and ground-glass opacities in the lung apices, possibly slightly progressed since prior CT chest from 01/21/2019 and further characterized on that study. A repeat high-resolution CT of the chest could further evaluate if clinically indicated. 5. Esophageal dilation with debris within in the partially visualized lower esophagus, compatible with history of scleroderma. This places the patient at risk for aspiration. Electronically Signed   By: Margaretha Sheffield MD   On: 02/05/2021 19:09    Procedures Procedures   Medications Ordered in ED Medications  iohexol (OMNIPAQUE) 350 MG/ML injection 100 mL (100 mLs Intravenous Contrast Given 02/05/21 1734)    ED Course  I have reviewed the triage vital signs and the nursing notes.  Pertinent labs & imaging results that were available during my care of the patient were reviewed by me and considered in my medical decision making (see chart for details).    MDM Rules/Calculators/A&P                          JACOLE CAPLEY is a 70 y.o. female with a past medical history significant for hypertension, hyperlipidemia, pulmonary hypertension on 3 L home oxygen, CKD, interstitial lung disease, peripheral arterial vascular disease with prior femoral vein endarterectomy as well as known mild to moderate stenosis in the carotids and vertebral arteries, chronic right leg wound currently scheduled for surgical debridement by plastic surgery tomorrow, prior DVT not on blood thinners due to GI bleed history with IVC filter in place, and prior non-Hodgkin's lymphoma who presents with aphasia and unsteadiness for the last 2 days.  Patient is  accompanied by husband who reports that she was at her baseline 2 days ago.  They have been on antibiotics to prepare for the surgical procedure tomorrow on her right leg for debridement to try to save the leg.  Has been reports that 2 days ago, she was ambulatory and speaking clearly without any difficulties but woke up and has not been able to speak a full sentence clearly since yesterday.  She is stuttering to get her words out and cannot get what she wants to say out.  He reports it is not slurred.  He says that she try to ambulate and was very unsteady which is new for her as well.  He does say that she has had some encephalopathy problems in the past with antibiotic use but this does seem slightly different.  He reports she has not been at her baseline since 2 days ago.  She is scheduled to have a debridement on her right leg chronic wounds tomorrow and he is concerned about that.  He reports it has not worsened.  Patient denies any fevers, cough, congestion, shortness of breath, chest pain, palpitations, urinary symptoms, constipation, diarrhea, nausea, or vomiting.  Other than the neurologic concerns and slight confusion, he reports he is at her baseline.  On exam, lungs are clear and chest is nontender.  Abdomen is nontender.  Her right leg is wrapped up completely but she reports it is unchanged from last several days.  She has intact sensation and strength in the legs.  Normal sensation and strength in the upper extremities.  Normal finger-nose-finger testing.  She has deformities to  both hands.  Pupils are symmetric and reactive normal extraocular was are present.  Symmetric smile.  Patient did have some difficulty answering questions at times but it was not dysarthric.  EKG did not show STEMI and showed some irregularity but it appeared to be a sinus irregularity.  I touch base with the teleneurology team and they do recommend getting the infectious and electrolyte work-up to look for an  encephalopathic cause of the speech difficulties but due to her known vascular disease, they did recommend is getting CTA head and neck as well as either admit for discovered stroke or ED to ED transfer for MRI prior to determining disposition tonight.  Patient and family are on board with this plan.   Work-up began to returned showing several findings.  Patient CT head did show evidence of remote infarct in the cerebellum and the left parietal cortex.  I called neurology again who looked at the images.  They suspect these were remote but could have recrudescence causing the patient's symptoms of speech and instability if these areas were aggravated by some other abnormality.  Patient was found to have likely UTI with nitrites and bacteria.  She was having urinary symptoms otherwise.  Patient is already on cefepime for her leg.  We discussed starting a different antibiotic however given her allergies and intolerances and current antibiotic therapy, they would rather talk to her PCP tomorrow to discuss antibiotic regimen changes as well as schedule her for outpatient MRI.  We offered her to get a ED to ED transfer tonight to get MRI as neurology initially recommended versus being admitted for the gait troubles and speech difficulties however family thought that it was likely starting to improve and they would rather go home if possible.  They also will call the surgeons tomorrow to discuss the operation in the morning and if it likely to be rescheduled.  We also offered to take down the dressing to look at the wound however given the clean and intact appearance of the dressing, lack of leg symptoms, and the pending procedure tomorrow, they would rather not take down the dressing if possible.  They understand extremely strict return precautions and will call their doctors tomorrow to discuss further management.  We did offer admission which they did not feel was needed at this time.  Anticipate admission if  they return to the emergency department for any worsening symptoms or any worsening concerns of infection.  Final Clinical Impression(s) / ED Diagnoses Final diagnoses:  Transient speech disturbance  Unsteadiness  Acute cystitis without hematuria  Fatigue, unspecified type    Rx / DC Orders ED Discharge Orders    None      Clinical Impression: 1. Transient speech disturbance   2. Unsteadiness   3. Acute cystitis without hematuria   4. Fatigue, unspecified type     Disposition: Discharge  Condition: Good  I have discussed the results, Dx and Tx plan with the pt(& family if present). He/she/they expressed understanding and agree(s) with the plan. Discharge instructions discussed at great length. Strict return precautions discussed and pt &/or family have verbalized understanding of the instructions. No further questions at time of discharge.    Discharge Medication List as of 02/05/2021  8:58 PM      Follow Up: [name], MD [id number] [place] [id number] [phone]     [place] [address] [id number] [place] [phone]  Domitila Stetler, Gwenyth Allegra, MD 02/05/21 2134

## 2021-02-05 NOTE — Discharge Instructions (Signed)
Your history and exam today was concerning for several different things.  Clinically I was concerned about possible TIA or stroke with your speech difficulty and difficulty with walking.  The CT scan did not show any acute stroke but did show evidence of possible old strokes.  The location of these injuries could cause the symptoms she has been having since yesterday.  I spoke with our neurology team who thinks it could be related to other irritation or infection that has caused her to have the presenting symptoms.  We did find bacteria and nitrites in her urine and thinks he may have a urinary tract infection however she did not have any urinary symptoms whatsoever.  We had a shared decision-making conversation and offered new or different antibiotics however as she is already on cefepime and has such intolerance and allergies to antibiotics, we agreed to let you speak to your primary doctor tomorrow to discuss antibiotic changes as well as outpatient MRI to definitively rule out stroke.  We also had a shared decision-making conversation about removing her dressing on her leg however as you are not having symptoms and it is being managed by the wound team and seeing the surgeons tomorrow, we agreed to hold on taking down the well-appearing dressing.  Please call the surgeon tomorrow and discuss if they feel comfortable doing the debridement and surgery.  We did offer admission for your symptoms being related to UTI and infection however you would rather try going home if possible.  We also offered MRI tonight with ADT to transfer which you did not want.  Please call your doctor tomorrow to discuss further management and if any symptoms change or worsen acutely, please return to the nearest emergency department.

## 2021-02-05 NOTE — ED Notes (Signed)
3L [place]

## 2021-02-05 NOTE — ED Notes (Signed)
Difficulty obtaining O2 sat at 1800- please see reading below

## 2021-02-06 ENCOUNTER — Ambulatory Visit (HOSPITAL_COMMUNITY): Admission: RE | Admit: 2021-02-06 | Payer: Medicare Other | Source: Home / Self Care | Admitting: Plastic Surgery

## 2021-02-06 ENCOUNTER — Telehealth: Payer: Self-pay | Admitting: Internal Medicine

## 2021-02-06 ENCOUNTER — Emergency Department (HOSPITAL_COMMUNITY): Payer: Medicare Other

## 2021-02-06 ENCOUNTER — Encounter (HOSPITAL_COMMUNITY): Payer: Self-pay | Admitting: Plastic Surgery

## 2021-02-06 ENCOUNTER — Encounter (HOSPITAL_BASED_OUTPATIENT_CLINIC_OR_DEPARTMENT_OTHER): Payer: Medicare Other | Admitting: Internal Medicine

## 2021-02-06 ENCOUNTER — Other Ambulatory Visit: Payer: Self-pay

## 2021-02-06 ENCOUNTER — Encounter: Payer: Self-pay | Admitting: Internal Medicine

## 2021-02-06 ENCOUNTER — Inpatient Hospital Stay (HOSPITAL_COMMUNITY)
Admission: EM | Admit: 2021-02-06 | Discharge: 2021-02-10 | DRG: 673 | Disposition: A | Discharge status: Home Health | Payer: Medicare Other | Attending: Internal Medicine | Admitting: Internal Medicine

## 2021-02-06 DIAGNOSIS — N390 Urinary tract infection, site not specified: Secondary | ICD-10-CM | POA: Diagnosis present

## 2021-02-06 DIAGNOSIS — D75839 Thrombocytosis, unspecified: Secondary | ICD-10-CM

## 2021-02-06 DIAGNOSIS — Z681 Body mass index (BMI) 19 or less, adult: Secondary | ICD-10-CM

## 2021-02-06 DIAGNOSIS — D539 Nutritional anemia, unspecified: Secondary | ICD-10-CM | POA: Diagnosis present

## 2021-02-06 DIAGNOSIS — Z9221 Personal history of antineoplastic chemotherapy: Secondary | ICD-10-CM

## 2021-02-06 DIAGNOSIS — I2721 Secondary pulmonary arterial hypertension: Secondary | ICD-10-CM

## 2021-02-06 DIAGNOSIS — E039 Hypothyroidism, unspecified: Secondary | ICD-10-CM | POA: Diagnosis present

## 2021-02-06 DIAGNOSIS — E861 Hypovolemia: Secondary | ICD-10-CM | POA: Diagnosis present

## 2021-02-06 DIAGNOSIS — I130 Hypertensive heart and chronic kidney disease with heart failure and stage 1 through stage 4 chronic kidney disease, or unspecified chronic kidney disease: Secondary | ICD-10-CM | POA: Diagnosis present

## 2021-02-06 DIAGNOSIS — Z83438 Family history of other disorder of lipoprotein metabolism and other lipidemia: Secondary | ICD-10-CM

## 2021-02-06 DIAGNOSIS — Z9981 Dependence on supplemental oxygen: Secondary | ICD-10-CM

## 2021-02-06 DIAGNOSIS — I5032 Chronic diastolic (congestive) heart failure: Secondary | ICD-10-CM | POA: Diagnosis present

## 2021-02-06 DIAGNOSIS — J9611 Chronic respiratory failure with hypoxia: Secondary | ICD-10-CM

## 2021-02-06 DIAGNOSIS — Z882 Allergy status to sulfonamides status: Secondary | ICD-10-CM

## 2021-02-06 DIAGNOSIS — Z7952 Long term (current) use of systemic steroids: Secondary | ICD-10-CM

## 2021-02-06 DIAGNOSIS — T148XXA Other injury of unspecified body region, initial encounter: Secondary | ICD-10-CM

## 2021-02-06 DIAGNOSIS — N3000 Acute cystitis without hematuria: Secondary | ICD-10-CM

## 2021-02-06 DIAGNOSIS — R5381 Other malaise: Secondary | ICD-10-CM

## 2021-02-06 DIAGNOSIS — Z8572 Personal history of non-Hodgkin lymphomas: Secondary | ICD-10-CM

## 2021-02-06 DIAGNOSIS — R531 Weakness: Secondary | ICD-10-CM

## 2021-02-06 DIAGNOSIS — S81801A Unspecified open wound, right lower leg, initial encounter: Secondary | ICD-10-CM | POA: Diagnosis present

## 2021-02-06 DIAGNOSIS — R404 Transient alteration of awareness: Secondary | ICD-10-CM | POA: Diagnosis not present

## 2021-02-06 DIAGNOSIS — R52 Pain, unspecified: Secondary | ICD-10-CM | POA: Diagnosis not present

## 2021-02-06 DIAGNOSIS — R4701 Aphasia: Secondary | ICD-10-CM | POA: Diagnosis present

## 2021-02-06 DIAGNOSIS — R0902 Hypoxemia: Secondary | ICD-10-CM | POA: Diagnosis not present

## 2021-02-06 DIAGNOSIS — Z880 Allergy status to penicillin: Secondary | ICD-10-CM

## 2021-02-06 DIAGNOSIS — Z20822 Contact with and (suspected) exposure to covid-19: Secondary | ICD-10-CM | POA: Diagnosis present

## 2021-02-06 DIAGNOSIS — E162 Hypoglycemia, unspecified: Secondary | ICD-10-CM | POA: Diagnosis not present

## 2021-02-06 DIAGNOSIS — N1832 Chronic kidney disease, stage 3b: Secondary | ICD-10-CM | POA: Diagnosis present

## 2021-02-06 DIAGNOSIS — N179 Acute kidney failure, unspecified: Principal | ICD-10-CM | POA: Diagnosis present

## 2021-02-06 DIAGNOSIS — E860 Dehydration: Secondary | ICD-10-CM | POA: Diagnosis present

## 2021-02-06 DIAGNOSIS — Z832 Family history of diseases of the blood and blood-forming organs and certain disorders involving the immune mechanism: Secondary | ICD-10-CM

## 2021-02-06 DIAGNOSIS — E785 Hyperlipidemia, unspecified: Secondary | ICD-10-CM | POA: Diagnosis present

## 2021-02-06 DIAGNOSIS — I4510 Unspecified right bundle-branch block: Secondary | ICD-10-CM | POA: Diagnosis present

## 2021-02-06 DIAGNOSIS — Z881 Allergy status to other antibiotic agents status: Secondary | ICD-10-CM

## 2021-02-06 DIAGNOSIS — M341 CR(E)ST syndrome: Secondary | ICD-10-CM | POA: Diagnosis present

## 2021-02-06 DIAGNOSIS — I480 Paroxysmal atrial fibrillation: Secondary | ICD-10-CM | POA: Diagnosis present

## 2021-02-06 DIAGNOSIS — R64 Cachexia: Secondary | ICD-10-CM | POA: Diagnosis present

## 2021-02-06 DIAGNOSIS — E876 Hypokalemia: Secondary | ICD-10-CM | POA: Diagnosis not present

## 2021-02-06 DIAGNOSIS — Z807 Family history of other malignant neoplasms of lymphoid, hematopoietic and related tissues: Secondary | ICD-10-CM

## 2021-02-06 DIAGNOSIS — M1990 Unspecified osteoarthritis, unspecified site: Secondary | ICD-10-CM | POA: Diagnosis present

## 2021-02-06 DIAGNOSIS — R000 Tachycardia, unspecified: Secondary | ICD-10-CM

## 2021-02-06 DIAGNOSIS — D849 Immunodeficiency, unspecified: Secondary | ICD-10-CM | POA: Diagnosis present

## 2021-02-06 DIAGNOSIS — I2781 Cor pulmonale (chronic): Secondary | ICD-10-CM | POA: Diagnosis present

## 2021-02-06 DIAGNOSIS — Z85820 Personal history of malignant melanoma of skin: Secondary | ICD-10-CM

## 2021-02-06 DIAGNOSIS — J849 Interstitial pulmonary disease, unspecified: Secondary | ICD-10-CM | POA: Diagnosis present

## 2021-02-06 DIAGNOSIS — H906 Mixed conductive and sensorineural hearing loss, bilateral: Secondary | ICD-10-CM | POA: Diagnosis present

## 2021-02-06 DIAGNOSIS — Z95828 Presence of other vascular implants and grafts: Secondary | ICD-10-CM

## 2021-02-06 DIAGNOSIS — E872 Acidosis: Secondary | ICD-10-CM | POA: Diagnosis present

## 2021-02-06 DIAGNOSIS — Z79899 Other long term (current) drug therapy: Secondary | ICD-10-CM

## 2021-02-06 DIAGNOSIS — I739 Peripheral vascular disease, unspecified: Secondary | ICD-10-CM | POA: Diagnosis present

## 2021-02-06 DIAGNOSIS — I21A1 Myocardial infarction type 2: Secondary | ICD-10-CM | POA: Diagnosis not present

## 2021-02-06 DIAGNOSIS — Z86718 Personal history of other venous thrombosis and embolism: Secondary | ICD-10-CM

## 2021-02-06 DIAGNOSIS — D6959 Other secondary thrombocytopenia: Secondary | ICD-10-CM | POA: Diagnosis present

## 2021-02-06 DIAGNOSIS — Z79890 Hormone replacement therapy (postmenopausal): Secondary | ICD-10-CM

## 2021-02-06 SURGERY — IRRIGATION AND DEBRIDEMENT EXTREMITY
Anesthesia: General | Site: Leg Lower | Laterality: Right

## 2021-02-06 NOTE — Telephone Encounter (Signed)
Phone call to [name] [name], patient's husband regarding status. ED notes from yesterday reviewed. Urine culture is pending. Is on antibiotics. I do not think UTI is causing her symptoms. CT angio yesterday did not show acute stroke. Remote left parietal cortical infarct and remote cerebellar infarct. Chronic microvascular disease. Still having dysarthria this am. 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 Also, remote hx of melanoma. Has severe scleroderma with chronic leg wound and was scheduled for surgery today for debridement. Dr. Lamonte Sakai see her for scleroderma. She is on chronic oxygen therapy. See my dictation January 13,2022.  Patient no better per phone call to husband. He feels she needs to go back to ED. Advised transfer by EMS to [facility] where she can be assessed by Neurology.

## 2021-02-06 NOTE — ED Triage Notes (Signed)
Reported was seen at [facility] facility yesterday and needs an MRI, here today for the same issue.

## 2021-02-07 ENCOUNTER — Emergency Department (HOSPITAL_COMMUNITY): Payer: Medicare Other

## 2021-02-07 DIAGNOSIS — J849 Interstitial pulmonary disease, unspecified: Secondary | ICD-10-CM | POA: Diagnosis not present

## 2021-02-07 DIAGNOSIS — R000 Tachycardia, unspecified: Secondary | ICD-10-CM | POA: Diagnosis not present

## 2021-02-07 DIAGNOSIS — R4701 Aphasia: Secondary | ICD-10-CM | POA: Diagnosis not present

## 2021-02-07 DIAGNOSIS — E039 Hypothyroidism, unspecified: Secondary | ICD-10-CM | POA: Diagnosis not present

## 2021-02-07 DIAGNOSIS — Z95828 Presence of other vascular implants and grafts: Secondary | ICD-10-CM | POA: Diagnosis not present

## 2021-02-07 DIAGNOSIS — D75839 Thrombocytosis, unspecified: Secondary | ICD-10-CM

## 2021-02-07 DIAGNOSIS — R5383 Other fatigue: Secondary | ICD-10-CM | POA: Diagnosis not present

## 2021-02-07 DIAGNOSIS — J9611 Chronic respiratory failure with hypoxia: Secondary | ICD-10-CM | POA: Diagnosis not present

## 2021-02-07 DIAGNOSIS — I5032 Chronic diastolic (congestive) heart failure: Secondary | ICD-10-CM

## 2021-02-07 DIAGNOSIS — E785 Hyperlipidemia, unspecified: Secondary | ICD-10-CM | POA: Diagnosis not present

## 2021-02-07 DIAGNOSIS — N3000 Acute cystitis without hematuria: Secondary | ICD-10-CM | POA: Diagnosis not present

## 2021-02-07 DIAGNOSIS — M1990 Unspecified osteoarthritis, unspecified site: Secondary | ICD-10-CM | POA: Diagnosis present

## 2021-02-07 DIAGNOSIS — H906 Mixed conductive and sensorineural hearing loss, bilateral: Secondary | ICD-10-CM | POA: Diagnosis present

## 2021-02-07 DIAGNOSIS — D539 Nutritional anemia, unspecified: Secondary | ICD-10-CM

## 2021-02-07 DIAGNOSIS — H748X2 Other specified disorders of left middle ear and mastoid: Secondary | ICD-10-CM | POA: Diagnosis not present

## 2021-02-07 DIAGNOSIS — R5381 Other malaise: Secondary | ICD-10-CM

## 2021-02-07 DIAGNOSIS — D849 Immunodeficiency, unspecified: Secondary | ICD-10-CM | POA: Diagnosis not present

## 2021-02-07 DIAGNOSIS — M341 CR(E)ST syndrome: Secondary | ICD-10-CM | POA: Diagnosis not present

## 2021-02-07 DIAGNOSIS — I130 Hypertensive heart and chronic kidney disease with heart failure and stage 1 through stage 4 chronic kidney disease, or unspecified chronic kidney disease: Secondary | ICD-10-CM | POA: Diagnosis not present

## 2021-02-07 DIAGNOSIS — N390 Urinary tract infection, site not specified: Secondary | ICD-10-CM | POA: Diagnosis not present

## 2021-02-07 DIAGNOSIS — I4891 Unspecified atrial fibrillation: Secondary | ICD-10-CM | POA: Diagnosis not present

## 2021-02-07 DIAGNOSIS — N1831 Chronic kidney disease, stage 3a: Secondary | ICD-10-CM

## 2021-02-07 DIAGNOSIS — I517 Cardiomegaly: Secondary | ICD-10-CM | POA: Diagnosis not present

## 2021-02-07 DIAGNOSIS — I21A1 Myocardial infarction type 2: Secondary | ICD-10-CM | POA: Diagnosis not present

## 2021-02-07 DIAGNOSIS — R531 Weakness: Secondary | ICD-10-CM | POA: Diagnosis not present

## 2021-02-07 DIAGNOSIS — R64 Cachexia: Secondary | ICD-10-CM | POA: Diagnosis not present

## 2021-02-07 DIAGNOSIS — E872 Acidosis: Secondary | ICD-10-CM | POA: Diagnosis present

## 2021-02-07 DIAGNOSIS — I6782 Cerebral ischemia: Secondary | ICD-10-CM | POA: Diagnosis not present

## 2021-02-07 DIAGNOSIS — N179 Acute kidney failure, unspecified: Secondary | ICD-10-CM

## 2021-02-07 DIAGNOSIS — S81801A Unspecified open wound, right lower leg, initial encounter: Secondary | ICD-10-CM | POA: Diagnosis not present

## 2021-02-07 DIAGNOSIS — I6389 Other cerebral infarction: Secondary | ICD-10-CM | POA: Diagnosis not present

## 2021-02-07 DIAGNOSIS — G319 Degenerative disease of nervous system, unspecified: Secondary | ICD-10-CM | POA: Diagnosis not present

## 2021-02-07 DIAGNOSIS — I480 Paroxysmal atrial fibrillation: Secondary | ICD-10-CM | POA: Diagnosis not present

## 2021-02-07 DIAGNOSIS — E860 Dehydration: Secondary | ICD-10-CM | POA: Diagnosis present

## 2021-02-07 DIAGNOSIS — D500 Iron deficiency anemia secondary to blood loss (chronic): Secondary | ICD-10-CM | POA: Diagnosis not present

## 2021-02-07 DIAGNOSIS — I2721 Secondary pulmonary arterial hypertension: Secondary | ICD-10-CM | POA: Diagnosis not present

## 2021-02-07 DIAGNOSIS — R4182 Altered mental status, unspecified: Secondary | ICD-10-CM | POA: Diagnosis not present

## 2021-02-07 DIAGNOSIS — Z20822 Contact with and (suspected) exposure to covid-19: Secondary | ICD-10-CM | POA: Diagnosis not present

## 2021-02-07 DIAGNOSIS — N1832 Chronic kidney disease, stage 3b: Secondary | ICD-10-CM | POA: Diagnosis present

## 2021-02-07 DIAGNOSIS — Z681 Body mass index (BMI) 19 or less, adult: Secondary | ICD-10-CM | POA: Diagnosis not present

## 2021-02-07 LAB — CBC WITH DIFFERENTIAL/PLATELET
Abs Immature Granulocytes: 0.2 10*3/uL — ABNORMAL HIGH (ref 0.00–0.07)
Abs Immature Granulocytes: 0.17 10*3/uL — ABNORMAL HIGH (ref 0.00–0.07)
Basophils Absolute: 0.1 10*3/uL (ref 0.0–0.1)
Basophils Absolute: 0 10*3/uL (ref 0.0–0.1)
Basophils Relative: 1 %
Basophils Relative: 0 %
Eosinophils Absolute: 0.1 10*3/uL (ref 0.0–0.5)
Eosinophils Absolute: 0.1 10*3/uL (ref 0.0–0.5)
Eosinophils Relative: 1 %
Eosinophils Relative: 1 %
HCT: 30.2 % — ABNORMAL LOW (ref 36.0–46.0)
HCT: 30.4 % — ABNORMAL LOW (ref 36.0–46.0)
Hemoglobin: 9.4 g/dL — ABNORMAL LOW (ref 12.0–15.0)
Hemoglobin: 9 g/dL — ABNORMAL LOW (ref 12.0–15.0)
Immature Granulocytes: 2 %
Immature Granulocytes: 2 %
Lymphocytes Relative: 8 %
Lymphocytes Relative: 9 %
Lymphs Abs: 0.8 10*3/uL (ref 0.7–4.0)
Lymphs Abs: 0.9 10*3/uL (ref 0.7–4.0)
MCH: 30.7 pg (ref 26.0–34.0)
MCH: 29.9 pg (ref 26.0–34.0)
MCHC: 31.1 g/dL (ref 30.0–36.0)
MCHC: 29.6 g/dL — ABNORMAL LOW (ref 30.0–36.0)
MCV: 98.7 fL (ref 80.0–100.0)
MCV: 101 fL — ABNORMAL HIGH (ref 80.0–100.0)
Monocytes Absolute: 1 10*3/uL (ref 0.1–1.0)
Monocytes Absolute: 0.9 10*3/uL (ref 0.1–1.0)
Monocytes Relative: 10 %
Monocytes Relative: 9 %
Neutro Abs: 8.1 10*3/uL — ABNORMAL HIGH (ref 1.7–7.7)
Neutro Abs: 8.2 10*3/uL — ABNORMAL HIGH (ref 1.7–7.7)
Neutrophils Relative %: 78 %
Neutrophils Relative %: 79 %
Platelets: 267 10*3/uL (ref 150–400)
Platelets: 275 10*3/uL (ref 150–400)
RBC: 3.06 MIL/uL — ABNORMAL LOW (ref 3.87–5.11)
RBC: 3.01 MIL/uL — ABNORMAL LOW (ref 3.87–5.11)
RDW: 18.6 % — ABNORMAL HIGH (ref 11.5–15.5)
RDW: 18.6 % — ABNORMAL HIGH (ref 11.5–15.5)
WBC: 10.3 10*3/uL (ref 4.0–10.5)
WBC: 10.3 10*3/uL (ref 4.0–10.5)
nRBC: 0 % (ref 0.0–0.2)
nRBC: 0 % (ref 0.0–0.2)

## 2021-02-07 LAB — COMPREHENSIVE METABOLIC PANEL
ALT: 15 U/L (ref 0–44)
AST: 25 U/L (ref 15–41)
Albumin: 2.4 g/dL — ABNORMAL LOW (ref 3.5–5.0)
Alkaline Phosphatase: 70 U/L (ref 38–126)
Anion gap: 16 — ABNORMAL HIGH (ref 5–15)
BUN: 35 mg/dL — ABNORMAL HIGH (ref 8–23)
CO2: 21 mmol/L — ABNORMAL LOW (ref 22–32)
Calcium: 8.1 mg/dL — ABNORMAL LOW (ref 8.9–10.3)
Chloride: 100 mmol/L (ref 98–111)
Creatinine, Ser: 1.91 mg/dL — ABNORMAL HIGH (ref 0.44–1.00)
GFR, Estimated: 28 mL/min — ABNORMAL LOW (ref >60)
Glucose, Bld: 74 mg/dL (ref 70–99)
Potassium: 3.6 mmol/L (ref 3.5–5.1)
Sodium: 137 mmol/L (ref 135–145)
Total Bilirubin: 0.8 mg/dL (ref 0.3–1.2)
Total Protein: 5.9 g/dL — ABNORMAL LOW (ref 6.5–8.1)

## 2021-02-07 LAB — URINALYSIS, ROUTINE W REFLEX MICROSCOPIC
Bilirubin Urine: NEGATIVE
Glucose, UA: NEGATIVE mg/dL
Ketones, ur: 5 mg/dL — AB
Leukocytes,Ua: NEGATIVE
Nitrite: NEGATIVE
Protein, ur: 100 mg/dL — AB
Specific Gravity, Urine: 1.025 (ref 1.005–1.030)
pH: 5 (ref 5.0–8.0)

## 2021-02-07 LAB — BASIC METABOLIC PANEL
Anion gap: 16 — ABNORMAL HIGH (ref 5–15)
BUN: 36 mg/dL — ABNORMAL HIGH (ref 8–23)
CO2: 21 mmol/L — ABNORMAL LOW (ref 22–32)
Calcium: 8.6 mg/dL — ABNORMAL LOW (ref 8.9–10.3)
Chloride: 98 mmol/L (ref 98–111)
Creatinine, Ser: 2.02 mg/dL — ABNORMAL HIGH (ref 0.44–1.00)
GFR, Estimated: 26 mL/min — ABNORMAL LOW (ref >60)
Glucose, Bld: 73 mg/dL (ref 70–99)
Potassium: 3.6 mmol/L (ref 3.5–5.1)
Sodium: 135 mmol/L (ref 135–145)

## 2021-02-07 LAB — URINE CULTURE: Culture: <10000 — AB

## 2021-02-07 LAB — LIPASE, BLOOD: Lipase: 113 U/L — ABNORMAL HIGH (ref 11–51)

## 2021-02-07 LAB — SARS CORONAVIRUS 2 (TAT 6-24 HRS): SARS Coronavirus 2: NEGATIVE

## 2021-02-07 MED ORDER — FENTANYL CITRATE (PF) 100 MCG/2ML IJ SOLN
25.0000 ug | Freq: Once | INTRAMUSCULAR | Status: AC
  Administered 2021-02-07: 25 ug via INTRAVENOUS
  Filled 2021-02-07: qty 2

## 2021-02-07 MED ORDER — SODIUM CHLORIDE 0.9 % IV BOLUS
1000.0000 mL | Freq: Once | INTRAVENOUS | Status: AC
  Administered 2021-02-07: 1000 mL via INTRAVENOUS

## 2021-02-07 MED ORDER — SILVER SULFADIAZINE 1 % EX CREA: TOPICAL_CREAM | Freq: Two times a day (BID) | CUTANEOUS | Status: DC

## 2021-02-07 MED ORDER — LORAZEPAM 2 MG/ML IJ SOLN
1.0000 mg | INTRAMUSCULAR | Status: DC | PRN
  Administered 2021-02-07 – 2021-02-09 (×4): 1 mg via INTRAVENOUS
  Filled 2021-02-07 (×4): qty 1

## 2021-02-07 MED ORDER — ACETAMINOPHEN 325 MG PO TABS
650.0000 mg | ORAL_TABLET | ORAL | Status: DC | PRN
  Administered 2021-02-08 (×2): 650 mg via ORAL
  Filled 2021-02-07 (×4): qty 2

## 2021-02-07 MED ORDER — SODIUM CHLORIDE 0.9 % IV SOLN: INTRAVENOUS | Status: DC

## 2021-02-07 MED ORDER — ONDANSETRON HCL 4 MG/2ML IJ SOLN
4.0000 mg | Freq: Once | INTRAMUSCULAR | Status: AC
  Administered 2021-02-07: 4 mg via INTRAVENOUS
  Filled 2021-02-07: qty 2

## 2021-02-07 MED ORDER — SODIUM CHLORIDE 0.9 % IV SOLN
1.0000 g | Freq: Once | INTRAVENOUS | Status: AC
  Administered 2021-02-07: 1 g via INTRAVENOUS
  Filled 2021-02-07: qty 10

## 2021-02-07 MED ORDER — HEPARIN SODIUM (PORCINE) 5000 UNIT/ML IJ SOLN
5000.0000 [IU] | Freq: Three times a day (TID) | INTRAMUSCULAR | Status: DC
  Administered 2021-02-07 (×2): 5000 [IU] via SUBCUTANEOUS
  Filled 2021-02-07 (×3): qty 1

## 2021-02-07 MED ORDER — SODIUM CHLORIDE 0.9 % IV BOLUS
500.0000 mL | Freq: Once | INTRAVENOUS | Status: AC
  Administered 2021-02-07: 500 mL via INTRAVENOUS

## 2021-02-07 MED ORDER — ACETAMINOPHEN 500 MG PO TABS
1000.0000 mg | ORAL_TABLET | Freq: Once | ORAL | Status: AC
  Administered 2021-02-07: 1000 mg via ORAL
  Filled 2021-02-07: qty 2

## 2021-02-07 MED ORDER — MUPIROCIN 2 % EX OINT
TOPICAL_OINTMENT | Freq: Two times a day (BID) | CUTANEOUS | Status: DC
  Administered 2021-02-07: 1 via TOPICAL
  Filled 2021-02-07 (×2): qty 22

## 2021-02-07 MED ORDER — OXYCODONE HCL 5 MG PO TABS: 5.0000 mg | ORAL_TABLET | ORAL | Status: DC | PRN

## 2021-02-07 MED ORDER — ACETAMINOPHEN 650 MG RE SUPP: 650.0000 mg | RECTAL | Status: DC | PRN

## 2021-02-07 NOTE — Assessment & Plan Note (Signed)
-  No concern for volume overload at this time.  No obvious pulmonary edema or effusions on CXR. -Patient currently on fluids in setting of acute on chronic renal failure -See discussion under pulmonary hypertension regarding CHF team -Continue fluids and monitor for any signs of volume overload

## 2021-02-07 NOTE — ED Provider Notes (Signed)
[facility] EMERGENCY DEPARTMENT Provider Note   CSN: [id number] Arrival date & time: 02/06/21  1308     History Chief Complaint  Patient presents with  . needs MRI    [name] [name] is a 70 y.o. female.  HPI Patient presents 2 days after being seen and evaluated at our affiliated facility, now with concern for ongoing weakness, and right leg lesions. History is obtained by the patient's husband, and the patient less so. Her medical history is substantial, including scleroderma, chronic leg ulcers, worsening weakness. With concern for this last condition, to the point where the patient is essentially nonambulatory, which is a marked change from her typical capacity to ambulate minimally, as well as complaints of dizziness, she was sent to our facility 2 days ago. There she had evaluation including labs, plan was for MRI after head CT demonstrated multiple nonspecific changes. An attempt was made to go home schedule outpatient MRI, but with worsening weakness, the patient presented yesterday to this facility, now after 1 failed attempts to obtain MRI due to her anxiousness she is here for evaluation. She complains of pain, seemingly right lower extremity, where it is typically present.  Pain is typically controlled at home with Tylenol, last dose was about 8 hours ago. No report of new fever, fall, other changes beyond worsening weakness, dizziness, and right lower extremity pain as above. She receives wound care at our wound clinic, as well as via home health.  Last dressing change was 1 week ago.     Past Medical History:  Diagnosis Date  . Anemia   . Arthritis   . DOE (dyspnea on exertion)   . Epistaxis   . History of chicken pox   . Hypertension   . Hypothyroidism   . Leg ulcer ([place])   . Melanoma ([place]) 1999  . 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[place])   . Non Hodgkin's lymphoma ([place])    Treated with chemo 2010, felt to be cured.  Marland Kitchen PAH (pulmonary artery hypertension)  ([place])   . Peripheral arterial occlusive disease ([place])   . Pneumonia 2008  . Pulmonary hypertension ([place])   . Requires supplemental oxygen    2 liters-3liters when moving  . 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 Iron deficiency anemia due to chronic blood loss 01/26/2021  . Anemia due to chronic blood loss 01/18/2021  . Anxiety state 01/18/2021  . 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 Disorder of calcium metabolism 01/18/2021  . Gastro-esophageal reflux disease with esophagitis, without bleeding 01/18/2021  . Oxygen dependent 01/18/2021  . Ulcer of esophagus with bleeding 01/18/2021  . Gangrene of finger of right hand ([place]) 11/29/2020  . Malnutrition of moderate degree 11/22/2020  . GI bleed 11/21/2020  . Eustachian tube dysfunction, bilateral 07/29/2020  . 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 Wrist fracture 11/24/2019  . Multiple closed stable fractures of pubic ramus ([place]) 11/23/2019  . Distal radius fracture, left 11/23/2019  . Fall at home, initial encounter 11/23/2019  . AMS (altered mental status) 11/12/2019  . Altered mental status 11/11/2019  . Hypomagnesemia 11/11/2019  . Osteoporosis 01/22/2019  . Hypothyroidism 06/16/2018  . Pulmonary hypertension associated with systemic disorder ([place]) 10/08/2017  . 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 Dry gangrene ([place]) 09/11/2017  . Medication reaction 09/11/2017  . ILD (interstitial lung disease) ([place]) 07/22/2017  . Nasal septal perforation 07/10/2017  . Chronic  respiratory failure ([place]) 10/31/2016  . Atherosclerosis of native arteries of right leg with ulceration of unspecified site ([place]) 09/14/2016  . Prolonged Q-T interval on ECG 07/29/2016  . CKD (chronic kidney disease), stage III ([place]) 07/29/2016  . Scleroderma ([place]) 05/06/2016  . Depression 04/24/2016  . Extensor tenosynovitis of wrist, right 02/24/2016  . 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 Hypokalemia 10/07/2015  . 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[place]) 09/01/2015  . Allergic rhinitis 03/08/2015  . High risk medication use 04/09/2013  . Venous hypertension, chronic, with ulcer and inflammation ([place]) 01/29/2013  . Lymphoma, non-Hodgkin's ([place]) 01/27/2013  . Right heart failure ([place]) 01/27/2013  . Varicose veins of lower extremities with ulcer and inflammation ([place]) 07/24/2012  . 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[place]) 04/08/2012  . Pulmonary hypertension associated with systemic disorder ([place]) 03/25/2012  . Peripheral arterial occlusive disease ([place]) 08/10/2011  . Essential hypertension 08/10/2011  . Hyperlipidemia 08/10/2011  . 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 ABDOMINAL AORTOGRAM W/LOWER EXTREMITY Bilateral 10/04/2020   Procedure: ABDOMINAL AORTOGRAM W/LOWER EXTREMITY;  Surgeon: Serafina Mitchell, MD;  Location: [facility] LAB;  Service: Cardiovascular;  Laterality: Bilateral;  . ABDOMINAL AORTOGRAM W/LOWER EXTREMITY Bilateral 01/02/2021   Procedure: ABDOMINAL AORTOGRAM W/LOWER EXTREMITY;  Surgeon: Waynetta Sandy, MD;  Location: [facility];  Service: Cardiovascular;  Laterality: Bilateral;  . AMPUTATION Right 10/24/2017   Procedure: RAY resection right index;  Surgeon: Daryll Brod, MD;  Location: [facility];  Service: Orthopedics;  Laterality: Right;  Axillary block  . AMPUTATION Right 11/21/2018   Procedure: AMPUTATION RAY RIGHT MIDDLE FINGER;  Surgeon: Daryll Brod, MD;  Location: [facility];  Service: Orthopedics;  Laterality: Right;  . AMPUTATION Right 10/22/2019   Procedure: AMPUTATION DIGIT RIGHT RING AND SMALL FINGER, DIGITAL SYMPATHECTOMY RIGHT RADIAL ULNAR ARCH;  Surgeon: Daryll Brod, MD;  Location: [facility];  Service: Orthopedics;  Laterality: Right;   AXILARY  . apligraph  12-04-2102,   12-18-2012   bilateral calves   [name]  . biopsy on cerebellum    . COLONOSCOPY WITH PROPOFOL Left 10/07/2015   Procedure: COLONOSCOPY WITH PROPOFOL;  Surgeon: [name], MD;  Location: [facility];  Service: Endoscopy;  Laterality: Left;  . ENDARTERECTOMY FEMORAL Right 09/14/2016   Procedure: ENDARTERECTOMY FEMORAL RIGHT;  Surgeon: [name], MD;  Location: [facility] OR;  Service: Vascular;  Laterality: Right;  . ENDOVENOUS ABLATION SAPHENOUS VEIN W/ LASER  07-24-2012   right greater saphenous vein by [name], M.D.   . ENDOVENOUS ABLATION SAPHENOUS VEIN W/ LASER  10-16-2012   left greater saphenous vein  by Dr. [name]  . ENDOVENOUS ABLATION SAPHENOUS VEIN W/ LASER Right 09-01-2015   endovenous laser ablation right greater saphenous vein by [name] MD  . ESOPHAGOGASTRODUODENOSCOPY N/A 05/07/2016   Procedure: ESOPHAGOGASTRODUODENOSCOPY (EGD);  Surgeon: [name], MD;  Location: [facility] ENDOSCOPY;  Service: Endoscopy;  Laterality: N/A;  . ESOPHAGOGASTRODUODENOSCOPY N/A 11/23/2020   Procedure: ESOPHAGOGASTRODUODENOSCOPY (EGD);  Surgeon: [name], MD;  Location: [facility];  Service: Endoscopy;  Laterality: N/A;  . ESOPHAGOGASTRODUODENOSCOPY (EGD) WITH PROPOFOL Left 10/07/2015   Procedure: ESOPHAGOGASTRODUODENOSCOPY (EGD) WITH PROPOFOL;  Surgeon: [name], MD;  Location: [facility];  Service: Endoscopy;  Laterality: Left;  . GIVENS CAPSULE STUDY N/A 07/31/2016   Procedure: GIVENS CAPSULE STUDY;  Surgeon: Altamese Dilling  Magod, MD;  Location: [facility] ENDOSCOPY;  Service: Endoscopy;  Laterality: N/A;  . GIVENS CAPSULE STUDY N/A 11/23/2020   Procedure: GIVENS CAPSULE STUDY;  Surgeon: [name], MD;  Location: [facility] ENDOSCOPY;  Service: Endoscopy;  Laterality: N/A;  . IR IVC FILTER PLMT / S&I Burke Keels GUID/MOD SED  11/22/2020  . LOWER EXTREMITY ANGIOGRAPHY N/A 10/25/2020   Procedure: LOWER EXTREMITY ANGIOGRAPHY - Left;  Surgeon: Serafina Mitchell, MD;   Location: [facility];  Service: Cardiovascular;  Laterality: N/A;  . melonoma removes Right    behind right knee  . OPEN REDUCTION INTERNAL FIXATION (ORIF) DISTAL RADIAL FRACTURE Left 11/24/2019   Procedure: OPEN REDUCTION INTERNAL FIXATION (ORIF) DISTAL RADIAL FRACTURE;  Surgeon: Altamese [place], MD;  Location: [facility];  Service: Orthopedics;  Laterality: Left;  . PATCH ANGIOPLASTY Right 09/14/2016   Procedure: PATCH ANGIOPLASTY RIGHT FEMORAL ARTERY USING HEMASHIELD PLATINUM FINESSE PATCH;  Surgeon: [name], MD;  Location: [facility];  Service: Vascular;  Laterality: Right;  . PERIPHERAL VASCULAR BALLOON ANGIOPLASTY Right 10/04/2020   Procedure: PERIPHERAL VASCULAR BALLOON ANGIOPLASTY;  Surgeon: Serafina Mitchell, MD;  Location: [facility];  Service: Cardiovascular;  Laterality: Right;  SFA/PT/AT/PTtrunk  . PERIPHERAL VASCULAR BALLOON ANGIOPLASTY Left 10/25/2020   Procedure: PERIPHERAL VASCULAR BALLOON ANGIOPLASTY;  Surgeon: Serafina Mitchell, MD;  Location: [place] [facility];  Service: Cardiovascular;  Laterality: Left;  anterior tibial and tibioperoneal trunk  . PERIPHERAL VASCULAR CATHETERIZATION N/A 09/07/2016   Procedure: Abdominal Aortogram w/Lower Extremity;  Surgeon: [name], MD;  Location: [facility] LAB;  Service: Cardiovascular;  Laterality: N/A;  . RIGHT HEART CATH N/A 04/19/2017   Procedure: Right Heart Cath;  Surgeon: [name], MD;  Location: [facility];  Service: Cardiovascular;  Laterality: N/A;  . RIGHT HEART CATHETERIZATION N/A 11/14/2012   Procedure: RIGHT HEART CATH;  Surgeon: [name], MD;  Location: [facility] CATH LAB;  Service: Cardiovascular;  Laterality: N/A;  . rt little toe removal  10/1998  . stent left thigh  3/11  . TENDON TRANSFER Right 10/24/2017   Procedure: transfer extensor indecus propius/extensor digitorum commomus index to middle ring and small;  Surgeon: Daryll Brod, MD;  Location: [facility];  Service: Orthopedics;   Laterality: Right;  . TONSILLECTOMY    . TONSILLECTOMY AND ADENOIDECTOMY    . TUNNELED VENOUS CATHETER PLACEMENT  01/2013  . ULNAR HEAD EXCISION Right 10/24/2017   Procedure: darrach right ulna;  Surgeon: Daryll Brod, MD;  Location: [facility];  Service: Orthopedics;  Laterality: Right;  . VEIN LIGATION AND STRIPPING  05/16/2012   Procedure: VEIN LIGATION AND STRIPPING;  Surgeon: [name], MD;  Location: [facility] OR;  Service: Vascular;  Laterality: Right;  Irrigation and Debridement right lower leg, ligation of saphenous vein.     OB History   No obstetric history on file.     Family History  Problem Relation Age of Onset  . Lupus Father   . Lymphoma Father   . Hyperlipidemia Mother   . Cancer Daughter        sarcoma  . Colon cancer Other     Social History   Tobacco Use  . Smoking status: Never Smoker  . Smokeless tobacco: Never Used  Vaping Use  . Vaping Use: Never used  Substance Use Topics  . Alcohol use: Yes    Alcohol/week: 7.0 standard drinks    Types: 3 Standard drinks or equivalent, 4 Glasses of wine per week  .  Drug use: No    Home Medications Prior to Admission medications   Medication Sig Start Date End Date Taking? Authorizing Provider  acetaminophen (TYLENOL) 650 MG CR tablet Take 1,300 mg by mouth every 8 (eight) hours as needed for pain.    [provider]  Biotin 5000 MCG CAPS Take 5,000 mcg by mouth daily.    [provider]  Calcium Carbonate-Vit D-Min (CALCIUM 1200 PO) Take 1 tablet by mouth at bedtime.    [provider]  Cholecalciferol (VITAMIN D) 2000 units CAPS Take 2,000 Units by mouth at bedtime.    [provider]  citalopram (CELEXA) 40 MG tablet TAKE 1 TABLET BY MOUTH EVERY DAY Patient taking differently: Take 40 mg by mouth at bedtime. 01/13/21   [name], MD  clopidogrel (PLAVIX) 75 MG tablet TAKE 1 TABLET BY MOUTH EVERY DAY Patient taking differently: Take 75 mg by mouth daily. 01/05/21   Bensimhon,  Shaune Pascal, MD  cycloSPORINE (RESTASIS) 0.05 % ophthalmic emulsion Place 2 drops into both eyes 2 (two) times daily.    [provider]  FERREX 150 150 MG capsule TAKE 1 CAPSULE BY MOUTH TWICE A DAY Patient taking differently: Take 150 mg by mouth 2 (two) times daily. 01/05/21   [name], MD  fish oil-omega-3 fatty acids 1000 MG capsule Take 1,000 mg by mouth 2 (two) times daily.    [provider]  folic acid (FOLVITE) 1 MG tablet TAKE 1 TABLET BY MOUTH EVERY DAY Patient taking differently: Take 1 mg by mouth daily. 01/05/21   [name], MD  HYDROcodone-acetaminophen (NORCO) 5-325 MG tablet One tab by mouth with food sparingly up to 3 times daily as needed for leg wound pain Patient not taking: No sig reported 01/20/21   [name], MD  levothyroxine (SYNTHROID) 50 MCG tablet TAKE 1 TABLET BY MOUTH DAILY BEFORE BREAKFAST Patient taking differently: Take 50 mcg by mouth daily before breakfast. 08/31/20   Baxley, Cresenciano Lick, MD  loperamide (IMODIUM) 2 MG capsule Take 2-4 mg by mouth 4 (four) times daily as needed for diarrhea or loose stools.    [provider]  MAGNESIUM ASPARTATE PO Take 150 mg by mouth in the morning and at bedtime.    [provider]  Multiple Vitamin (MULTIVITAMIN WITH MINERALS) TABS tablet Take 1 tablet by mouth daily at 12 noon.    [provider]  OPSUMIT 10 MG tablet Take 1 tablet (2m) by mouth daily. Patient taking differently: Take 10 mg by mouth daily. 04/29/20   Bensimhon, DShaune Pascal MD  OXYGEN Inhale 2-3 L into the lungs continuous.     [provider]  pantoprazole (PROTONIX) 40 MG tablet Take 1 tablet (40 mg total) by mouth 2 (two) times daily. 10/05/20   BElby Showers MD  potassium chloride 20 MEQ/15ML (10%) SOLN TAKE 2 TEASPOONFUL (DILUTED IN WATER) ONCE DAILY Patient taking differently: Take 20 mEq by mouth daily. 08/02/20   BElby Showers MD  predniSONE (DELTASONE) 5 MG tablet Take 5 mg by mouth daily.  04/04/20   [provider]  rosuvastatin (CRESTOR) 5 MG tablet Take 1 tablet every Monday, Wednesday, and Friday. Patient taking differently: Take 5 mg by mouth every Monday, Wednesday, and Friday. 08/29/20   Bensimhon, DShaune Pascal MD  sildenafil (REVATIO) 20 MG tablet Take 3 tablets (60 mg total) by mouth 3 (three) times daily. 02/02/21   Bensimhon, DShaune Pascal MD  spironolactone (ALDACTONE) 25 MG tablet  TAKE 1 TABLET BY MOUTH EVERY DAY Patient taking differently: Take 25 mg by mouth daily. 01/09/21   Bensimhon, Shaune Pascal, MD  torsemide (DEMADEX) 20 MG tablet Take 20-40 mg by mouth See admin instructions. Take 2 tablets (40 mg) by mouth in the morning & take 1 tablet (20 mg) by mouth at night.    [provider]  traMADol (ULTRAM) 50 MG tablet Take 50 mg by mouth every 6 (six) hours as needed for moderate pain or severe pain. 11/28/20   [provider]  valACYclovir (VALTREX) 500 MG tablet Take 1 tablet (500 mg total) by mouth 2 (two) times daily. Patient taking differently: No sig reported 09/02/19   [name], MD  vitamin B-12 (CYANOCOBALAMIN) 1000 MCG tablet Take 1,000 mcg by mouth daily.    [provider]    Allergies    Levofloxacin, Sulfa antibiotics, Doxycycline hyclate, and Penicillins  Review of Systems   Review of Systems  Constitutional: Negative for chills, diaphoresis, fatigue and fever.  HENT:       Chronic dysphagia  Eyes: Negative for visual disturbance.  Respiratory: Negative for chest tightness and shortness of breath.   Cardiovascular: Negative for chest pain.  Gastrointestinal: Negative for abdominal pain, constipation, diarrhea, nausea and vomiting.  Genitourinary: Negative for dysuria.  Musculoskeletal: Positive for gait problem (insteadiness). Negative for back pain, neck pain and neck stiffness.  Skin: Positive for color change and wound.  Neurological: Positive for speech difficulty and weakness. Negative for dizziness, syncope,  light-headedness, numbness and headaches.  Psychiatric/Behavioral: Negative for agitation.  All other systems reviewed and are negative.   Physical Exam Updated Vital Signs BP (!) 147/59   Pulse 77   Temp 98.2 F (36.8 C) (Oral)   Resp (!) 23   SpO2 100%    Physical Exam Vitals and nursing note reviewed.  Constitutional:      Appearance: She is well-developed and well-nourished. She is ill-appearing.  HENT:     Head: Normocephalic and atraumatic.     Mouth/Throat:     Mouth: Mucous membranes are dry.  Eyes:     General: No scleral icterus.    Extraocular Movements: EOM normal.     Conjunctiva/sclera: Conjunctivae normal.  Cardiovascular:     Rate and Rhythm: Normal rate and regular rhythm.  Pulmonary:     Effort: Pulmonary effort is normal. No respiratory distress.     Breath sounds: Normal breath sounds. No stridor.  Abdominal:     General: There is no distension.  Musculoskeletal:        General: No edema.     Comments: No deformity of either lower extremity, but substantial cutaneous changes as below.  Skin:    General: Skin is warm and dry.       Neurological:     Mental Status: She is alert and oriented to person, place, and time.     Cranial Nerves: Dysarthria present. No cranial nerve deficit.     Motor: Weakness and atrophy present. No tremor.  Psychiatric:        Mood and Affect: Mood and affect normal.        Behavior: Behavior is slowed.     R LE lateral wound w visible muscle and tendon  ED Results / Procedures / Treatments   Labs (all labs ordered are listed, but only abnormal results are displayed) Labs Reviewed  CBC WITH DIFFERENTIAL/PLATELET - Abnormal; Notable for the following components:      Result Value  RBC 3.06 (*)    Hemoglobin 9.4 (*)    HCT 30.2 (*)    RDW 18.6 (*)    Neutro Abs 8.1 (*)    Abs Immature Granulocytes 0.20 (*)    All other components within normal limits  BASIC METABOLIC PANEL - Abnormal; Notable for the  following components:   CO2 21 (*)    BUN 36 (*)    Creatinine, Ser 2.02 (*)    Calcium 8.6 (*)    GFR, Estimated 26 (*)    Anion gap 16 (*)    All other components within normal limits  COMPREHENSIVE METABOLIC PANEL  LIPASE, BLOOD  CBC WITH DIFFERENTIAL/PLATELET  URINALYSIS, ROUTINE W REFLEX MICROSCOPIC  POC SARS CORONAVIRUS 2 AG -  ED    EKG None  Radiology CT Angio Head W or Wo Contrast  Result Date: 02/05/2021 CLINICAL DATA:  Neuro deficit, acute stroke suspected.  New aphasia. EXAM: CT ANGIOGRAPHY HEAD AND NECK TECHNIQUE: Multidetector CT imaging of the head and neck was performed using the standard protocol during bolus administration of intravenous contrast. Multiplanar CT image reconstructions and MIPs were obtained to evaluate the vascular anatomy. Carotid stenosis measurements (when applicable) are obtained utilizing NASCET criteria, using the distal internal carotid diameter as the denominator. CONTRAST:  131m OMNIPAQUE IOHEXOL 350 MG/ML SOLN COMPARISON:  CT head December 14, 2020. CTA November 11, 2019 FINDINGS: CT HEAD FINDINGS Brain: High left remote parietal cortical infarct, similar to prior. Remote right cerebellar lacunar infarct. No evidence of acute large vascular territory infarct. Patchy white matter hypoattenuation, likely the sequela of chronic microvascular ischemic disease. No acute hemorrhage. No hydrocephalus. Similar mild diffuse cerebral atrophy with ex vacuo ventricular dilation. No mass lesion or abnormal mass effect. Skull: No acute fracture. Prior right occipital craniectomy with cranioplasty. Sinuses: Sinuses are largely clear. Orbits: No acute findings. Review of the MIP images confirms the above findings CTA NECK FINDINGS Aortic arch: Calcific atherosclerosis. Great vessel origins are patent. Right carotid system: Predominately calcific atherosclerosis at the carotid bifurcation without evidence of greater than 50% stenosis. Left carotid system: Predominately  calcific atherosclerosis at the carotid bifurcation without evidence of greater than 50% stenosis. Vertebral arteries: Mild atherosclerotic narrowing of bilateral vertebral artery origins. Mild to moderate stenosis of the distal right vertebral artery. Otherwise, no significant stenosis. Codominant. 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 Other neck: No suspicious mass or adenopathy Upper chest: Partially imaged diffuse nodular and ground-glass opacities in the lung apices, possibly slightly progressed since prior CT chest from 01/21/2019 and further characterized on that study. Esophageal dilation with debris within in the partially visualized lower esophagus Review of the MIP images confirms the above findings CTA HEAD FINDINGS Anterior circulation: Mild-to-moderate bilateral cavernous carotid narrowing secondary to predominately calcific atherosclerosis. Otherwise, no significant stenosis, proximal occlusion, aneurysm, or vascular malformation. Posterior circulation: Mild to moderate stenosis of the distal right vertebral artery. Otherwise, no significant stenosis. No large vessel occlusion. No aneurysm. Venous sinuses: As permitted by contrast timing, patent. Review of the MIP images confirms the above findings IMPRESSION: CT head: 1. No evidence of acute intracranial abnormality. 2. 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 3. Chronic microvascular ischemic disease. CTA: 1. No emergent large vessel occlusion. 2. Similar mild to moderate bilateral cavernous carotid atherosclerosis. 3. Similar mild to moderate distal right vertebral artery stenosis. 4. Partially imaged diffuse nodular and ground-glass  opacities in the lung apices, possibly slightly progressed since prior CT chest from 01/21/2019 and further characterized on that study. A repeat  high-resolution CT of the chest could further evaluate if clinically indicated. 5. Esophageal dilation with debris within in the partially visualized lower esophagus, compatible with history of scleroderma. This places the patient at risk for aspiration. Electronically Signed   By: Margaretha Sheffield MD   On: 02/05/2021 19:09   CT Angio Neck W and/or Wo Contrast  Result Date: 02/05/2021 CLINICAL DATA:  Neuro deficit, acute stroke suspected.  New aphasia. EXAM: CT ANGIOGRAPHY HEAD AND NECK TECHNIQUE: Multidetector CT imaging of the head and neck was performed using the standard protocol during bolus administration of intravenous contrast. Multiplanar CT image reconstructions and MIPs were obtained to evaluate the vascular anatomy. Carotid stenosis measurements (when applicable) are obtained utilizing NASCET criteria, using the distal internal carotid diameter as the denominator. CONTRAST:  156m OMNIPAQUE IOHEXOL 350 MG/ML SOLN COMPARISON:  CT head December 14, 2020. CTA November 11, 2019 FINDINGS: CT HEAD FINDINGS Brain: High left remote parietal cortical infarct, similar to prior. Remote right cerebellar lacunar infarct. No evidence of acute large vascular territory infarct. Patchy white matter hypoattenuation, likely the sequela of chronic microvascular ischemic disease. No acute hemorrhage. No hydrocephalus. Similar mild diffuse cerebral atrophy with ex vacuo ventricular dilation. No mass lesion or abnormal mass effect. Skull: No acute fracture. Prior right occipital craniectomy with cranioplasty. Sinuses: Sinuses are largely clear. Orbits: No acute findings. Review of the MIP images confirms the above findings CTA NECK FINDINGS Aortic arch: Calcific atherosclerosis. Great vessel origins are patent. Right carotid system: Predominately calcific atherosclerosis at the carotid bifurcation without evidence of greater than 50% stenosis. Left carotid system: Predominately calcific atherosclerosis at the carotid  bifurcation without evidence of greater than 50% stenosis. Vertebral arteries: Mild atherosclerotic narrowing of bilateral vertebral artery origins. Mild to moderate stenosis of the distal right vertebral artery. Otherwise, no significant stenosis. Codominant. 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 Other neck: No suspicious mass or adenopathy Upper chest: Partially imaged diffuse nodular and ground-glass opacities in the lung apices, possibly slightly progressed since prior CT chest from 01/21/2019 and further characterized on that study. Esophageal dilation with debris within in the partially visualized lower esophagus Review of the MIP images confirms the above findings CTA HEAD FINDINGS Anterior circulation: Mild-to-moderate bilateral cavernous carotid narrowing secondary to predominately calcific atherosclerosis. Otherwise, no significant stenosis, proximal occlusion, aneurysm, or vascular malformation. Posterior circulation: Mild to moderate stenosis of the distal right vertebral artery. Otherwise, no significant stenosis. No large vessel occlusion. No aneurysm. Venous sinuses: As permitted by contrast timing, patent. Review of the MIP images confirms the above findings IMPRESSION: CT head: 1. No evidence of acute intracranial abnormality. 2. 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 3. Chronic microvascular ischemic disease. CTA: 1. No emergent large vessel occlusion. 2. Similar mild to moderate bilateral cavernous carotid atherosclerosis. 3. Similar mild to moderate distal right vertebral artery stenosis. 4. Partially imaged diffuse nodular and ground-glass opacities in the lung apices, possibly slightly progressed since prior CT chest from 01/21/2019 and further characterized on that study. A repeat high-resolution CT of the chest could further  evaluate if clinically indicated. 5. Esophageal dilation with debris within in the partially visualized lower esophagus, compatible with history of scleroderma. This places the patient at risk for aspiration. Electronically Signed  By: Margaretha Sheffield MD   On: 02/05/2021 19:09   MR BRAIN WO CONTRAST  Result Date: 02/07/2021 CLINICAL DATA:  Weakness.  Mental status change. EXAM: MRI HEAD WITHOUT CONTRAST TECHNIQUE: Multiplanar, multiecho pulse sequences of the brain and surrounding structures were obtained without intravenous contrast. COMPARISON:  CT February 05, 2021. FINDINGS: Motion limited exam. Brain: No acute infarction, acute hemorrhage, hydrocephalus, extra-axial collection or mass lesion. Moderate diffuse cerebral atrophy with ex vacuo ventricular dilation. Moderate patchy T2/FLAIR hyperintensities within the white matter and pons, likely the sequela of chronic microvascular ischemic disease. 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 Remote right sellar bell ir lacunar infarct. Surrounding gliosis. Vascular: Major arterial flow voids are maintained at the skull base. Skull and upper cervical spine: Normal marrow signal. Sinuses/Orbits: Sinuses are largely clear.  Unremarkable orbits. Other: Left mastoid effusion. IMPRESSION: 1. No evidence of acute intracranial abnormality on this motion limited exam. 2. 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 3. Moderate chronic microvascular ischemic disease and generalized cerebral atrophy. 4. Left mastoid effusion. Electronically Signed   By: Margaretha Sheffield MD   On: 02/07/2021 10:45   DG Chest Port 1 View  Result Date: 02/07/2021 CLINICAL DATA:  Weakness.  Central catheter placement EXAM: PORTABLE CHEST 1 VIEW COMPARISON:  November 21, 2020 FINDINGS: Central catheter tip is at the cavoatrial junction. No pneumothorax. There is interstitial thickening without frank edema or consolidation. Heart is mildly enlarged  with pulmonary vascularity normal. No adenopathy. There is aortic atherosclerosis. No bone lesions. IMPRESSION: Central catheter tip at cavoatrial junction. No pneumothorax. Central interstitial thickening, likely indicative of a degree of chronic bronchitis. No edema or airspace opacity. Mild cardiomegaly. Aortic Atherosclerosis (ICD10-I70.0). Electronically Signed   By: Lowella Grip III M.D.   On: 02/07/2021 09:25     Medications Ordered in ED Medications  LORazepam (ATIVAN) injection 1 mg (1 mg Intravenous Given 02/07/21 0934)  sodium chloride 0.9 % bolus 1,000 mL (has no administration in time range)  cefTRIAXone (ROCEPHIN) 1 g in sodium chloride 0.9 % 100 mL IVPB (has no administration in time range)  sodium chloride 0.9 % bolus 1,000 mL (has no administration in time range)    And  0.9 %  sodium chloride infusion (has no administration in time range)  sodium chloride 0.9 % bolus 500 mL (0 mLs Intravenous Stopped 02/07/21 1149)  acetaminophen (TYLENOL) tablet 1,000 mg (1,000 mg Oral Given 02/07/21 0834)  fentaNYL (SUBLIMAZE) injection 25 mcg (25 mcg Intravenous Given 02/07/21 1207)  ondansetron (ZOFRAN) injection 4 mg (4 mg Intravenous Given 02/07/21 1206)    ED Course  I have reviewed the triage vital signs and the nursing notes.  Pertinent labs & imaging results that were available during my care of the patient were reviewed by me and considered in my medical decision making (see chart for details).    12:11 PM On repeat exam, after patient's legs have been unwrapped, is clear the patient has deep tissue involvement of her right leg wound, that was scheduled for surgical repair yesterday.  I reviewed the patient's labs with her and her husband, labs notable for acute kidney injury, with near doubling of her creatinine over 2 days.  With increased absolute immature granulocyte count, substantial wound, there is also concern for right lower extremity infection. Patient has continued fluid  resuscitation started after initial intervention, now with antibiotics for wound infection as above, as well as urinary tract infection diagnosed 2 days ago.  Given these abnormalities, though the patient's MRI did not demonstrate acute stroke, patient will require admission for appropriate ongoing monitoring and management.  MDM Rules/Calculators/A&P MDM Number of Diagnoses or Management Options Acute cystitis without hematuria: established, worsening AKI (acute kidney injury) ([place]): new, needed workup Weakness: established, worsening   Amount and/or Complexity of Data Reviewed Clinical lab tests: reviewed Tests in the radiology section of CPT: reviewed Tests in the medicine section of CPT: reviewed Decide to obtain previous medical records or to obtain history from someone other than the patient: yes Obtain history from someone other than the patient: yes Review and summarize past medical records: yes Discuss the patient with other providers: yes Independent visualization of images, tracings, or specimens: yes  Risk of Complications, Morbidity, and/or Mortality Presenting problems: high Diagnostic procedures: high Management options: high  Critical Care Total time providing critical care: < 30 minutes  Patient Progress Patient progress: stable  Final Clinical Impression(s) / ED Diagnoses Final diagnoses:  AKI (acute kidney injury) ([place])  Acute cystitis without hematuria  Weakness    Rx / DC Orders ED Discharge Orders    None       Carmin Muskrat, MD 02/07/21 1214

## 2021-02-07 NOTE — Assessment & Plan Note (Signed)
-  Check TSH -Continue home Synthroid after med rec complete

## 2021-02-07 NOTE — Assessment & Plan Note (Signed)
-  seen by Dr. Lorenso Courier for normocytic anemia in Jan 2022 - s/p iron infusions as well; too soon to follow up iron levels - given macrocytosis, check folate and B12 now

## 2021-02-07 NOTE — ED Notes (Signed)
Dinner Tray Ordered @ [phone].

## 2021-02-07 NOTE — Assessment & Plan Note (Signed)
-  Husband states patient is typically able to walk some at home although not long distances. She has become progressively weaker from her decreased appetite -Will ultimately need PT evaluation some before surgery but if has surgery while hospitalized, certainly after -Holding off on PT eval for now until more medically stable and able to partake in therapy better

## 2021-02-07 NOTE — Assessment & Plan Note (Signed)
-  on 2-3 L O2 at home; currently at baseline - covid swab pending - continue O2 and monitor status while on IVF

## 2021-02-07 NOTE — Consult Note (Signed)
[name] for Infectious Disease    Date of Admission:  02/06/2021     Reason for Consult: history of chronic wound infection, immunosuppressed    Referring Provider: Girguis    Lines:  2/1-c rue picc  Abx: Outpatient cefepime at least 1 week        Assessment: 70 y.o. female PMH crest, pulm htn, venous stasis, hx dvt s/p pvc filter (doac stopped 10/2020 due to gib), pvd referred by wound center for infected chronic open wound started on cefepime, admitted 2/08 for dehydration/aki  Despite cefepime, her wound remains the same. Her current clinical picture is an iatrogenic result of cefepime/abx leading to dehydration/aki. Mentation slow too due to dehydration/aki, and potentially cefepime cns toxicity  Given lack of response of the wound to cefepime (as expected), unless she develop frank sepsis with clinical evidence worsening cellulitis/ssi, would avoid further abx for now  She has no evidence of other localizing infectious syndrome     Plan: 1. No further abx indicated at this time 2. Continue supportive care for aki per primary team 3. I will see her again once in f/u and make sure she continues to do ok from ssi standpoint    Principal Problem:   Acute renal failure superimposed on stage 3a chronic kidney disease ([place]) Active Problems:   Chronic respiratory failure with hypoxia (HCC)   Hypothyroidism   Macrocytic anemia   Thrombocytosis   PAH (pulmonary artery hypertension) (HCC)   Chronic diastolic CHF (congestive heart failure) (HCC)   Non-healing wound of right lower extremity   Physical deconditioning   Scheduled Meds: . heparin  5,000 Units Subcutaneous Q8H   Continuous Infusions: . sodium chloride     PRN Meds:.acetaminophen **OR** acetaminophen, LORazepam, oxyCODONE  HPI: [name] [name] is a 70 y.o. 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 Seen by wound clinic Complicated by pulm htn/hypoxemic resp failure, venous stasis, crest syndrome Previous superficial cx mrsa in 2016 There is a biopsy of the wound 09/2015 "lipodermatosclerosis, without malignancy" Wound progressively worse in terms of size/depth by summer 2017 despite aggressive conservative care  08/2016 angiogram with right femoral endarterectomy/dacron patchy angioplasty Wound care had deemed patient "not candidate for skin grafting due to scleroderma/venous hypertension." 09/2020 repeated angioplasty right anterior tibial artery and tibioperoneal trunk and right superficial femoral artery. subsequewntly angioplasty of left anterior tibial artery and left tibial peroneal trunk 10/2020 acute right LE dvt. S/p ivc filter due to gib; xarelto stopped late 10/2020. 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 So referred to ID. She had been started on cefdinir. Compression dressing also applied but she had developed some ischemic distal changes. The pain/redness have been better.   She has plastic surgery outpatient f/u  I started her on iv cefepime to keep what seems to be ssi at [place] awaiting plastic surgery. I did discuss with her the poor prgnosis and the substantial probability that  the abx might not do anything. I also express concern surgery (debridement) might not help given her crest syndrome/pvd  She developed several days diarrhea and subsequently was fatigued. The diarrhea seems to  be improving the past few days. Denies f/c. The wound appears the same, not worse or better   On admission, she was afebrile, mild wbc elevation at 11 ucx 2/06 <10k  Insignificant growth Cr 1.2 --> 2.0 (baseline 1.03); lft wnl Due to fatigue and slow to response, an mri brain was obtained and showed no acute cva cxr no acute cardiopulm process   -------- Hx abx allergy: Penicillin allergy -- 34 years ago. Rash on face. She takes cephalexin and other cephalosporin products fine. havent tried amoxicillin or augmentin yet.  levaquin -- face swelling years ago early 2000s. Patient had ciprofloxacin after and she did ok with that   Soc Hx: Lives with husband She never smokes   Meds: Sildenafil Prednisone 5 mg a day (current dose for 9 years) No other immunosuppressants   Review of Systems: ROS  Past Medical History:  Diagnosis Date  . Anemia   . Arthritis   . DOE (dyspnea on exertion)   . Epistaxis   . History of chicken pox   . Hypertension   . Hypothyroidism   . Leg ulcer ([place])   . Melanoma ([place]) 1999  . 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[place])   . 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  Marland Kitchen PAH (pulmonary artery hypertension) ([place])   . Peripheral arterial occlusive disease ([place])   . Pneumonia 2008  . Pulmonary hypertension ([place])   . Requires supplemental oxygen    2 liters-3liters when moving  . 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 Smoking status: Never Smoker  . Smokeless tobacco: Never Used  Vaping Use  . Vaping Use: Never used  Substance Use Topics  . Alcohol use: Yes    Alcohol/week: 7.0 standard drinks    Types: 3 Standard drinks or equivalent, 4 Glasses of wine per week  . Drug use: No    Family History  Problem Relation Age of Onset  . Lupus Father   . Lymphoma Father   . Hyperlipidemia Mother   . Cancer Daughter        sarcoma  . Colon cancer Other    Allergies  Allergen  Reactions  . Levofloxacin Other (See Comments)    FLU LIKE SYMPTOM   . Sulfa Antibiotics Rash and Other (See Comments)    Face peeled Severe rash with significant peeling of face. No airway involvement per patient   . Doxycycline Hyclate Swelling    SWELLING REACTION UNSPECIFIED  "TABLETS ONLY - CAPSULES ARE TOLERATED FINE"  . Penicillins Rash    Has patient had a PCN reaction causing immediate rash, facial/tongue/throat swelling, SOB or lightheadedness with hypotension: no Has patient had a PCN reaction causing severe rash involving mucus membranes or skin necrosis: no Has patient had a PCN reaction that required hospitalization: no Has patient had a PCN reaction occurring within the last 10 years: {no If all of the above answers are "NO", then may proceed with Cephalosporin use.    OBJECTIVE: Blood pressure (!) 147/59, pulse 77, temperature 98.2 F (36.8 C), temperature source Oral, resp. rate (!) 23, SpO2 100 %.  Physical Exam Chronically ill appearing, sleeping but opened eyes to voice, not much interaction otherwise Heent: atraumatic; per; conj clear Neck  supple cv rrr no mrg Lungs clear; on baseline 2 liters oxygen supplement abd s/nt Ext mild edema on RLE; left LE in coban dressing Skin Stable (compared to when I saw her on 01/20/21) large 3x4 inch irregular edge stage 3 open wound with minimal gelatinous green discharge vs fibrinous exudate, and eschar at edge and satelite small ulcers, along with erythema and mild tenderness. No fluctuance or swelling Msk: s/p partial several fingers ampuation in the past.  Neuro nonfocal; appears fatigued Psych not much interaction, oriented  Lab Results Lab Results  Component Value Date   WBC 10.3 02/07/2021   HGB 9.0 (L) 02/07/2021   HCT 30.4 (L) 02/07/2021   MCV 101.0 (H) 02/07/2021   PLT 275 02/07/2021    Lab Results  Component Value Date   CREATININE 1.91 (H) 02/07/2021   BUN 35 (H) 02/07/2021   NA 137 02/07/2021   K  3.6 02/07/2021   CL 100 02/07/2021   CO2 21 (L) 02/07/2021    Lab Results  Component Value Date   ALT 15 02/07/2021   AST 25 02/07/2021   ALKPHOS 70 02/07/2021   BILITOT 0.8 02/07/2021     Microbiology: Recent Results (from the past 240 hour(s))  SARS CORONAVIRUS 2 (TAT 6-24 HRS) Nasopharyngeal Nasopharyngeal Swab     Status: None   Collection Time: 02/03/21 10:05 AM   Specimen: Nasopharyngeal Swab  Result Value Ref Range Status   SARS Coronavirus 2 NEGATIVE NEGATIVE Final    Comment: (NOTE) SARS-CoV-2 target nucleic acids are NOT DETECTED.  The SARS-CoV-2 RNA is generally detectable in upper and lower respiratory specimens during the acute phase of infection. Negative results do not preclude SARS-CoV-2 infection, do not rule out co-infections with other pathogens, and should not be used as the sole basis for treatment or other patient management decisions. Negative results must be combined with clinical observations, patient history, and epidemiological information. The expected result is Negative.  Fact Sheet for Patients: SugarRoll.be  Fact Sheet for Healthcare Providers: https://www.woods-mathews.com/  This test is not yet approved or cleared by the [place] FDA and  has been authorized for detection and/or diagnosis of SARS-CoV-2 by FDA under an Emergency Use Authorization (EUA). This EUA will remain  in effect (meaning this test can be used) for the duration of the COVID-19 declaration under Se ction 564(b)(1) of the Act, 21 U.S.C. section 360bbb-3(b)(1), unless the authorization is terminated or revoked sooner.  Performed at [facility], [place] [address], [place], [place] [address]   Urine culture     Status: Abnormal   Collection Time: 02/05/21  4:08 PM   Specimen: Urine, Clean Catch  Result Value Ref Range Status   Specimen Description   Final    URINE, CLEAN CATCH Performed at [facility], [address] [place], [place] [address]    Special Requests   Final    NONE Performed at [facility], [address] [place], [place] [address]    Culture (A)  Final    <10,000 COLONIES/mL INSIGNIFICANT GROWTH Performed at [facility], [place] [address] [place], [place] [address]    Report Status 02/07/2021 FINAL  Final   Imaging: 2/8 cxr Reviewed Central catheter tip at cavoatrial junction. No pneumothorax. Central interstitial thickening, likely indicative of a degree of chronic bronchitis. No edema or airspace opacity.  Mild cardiomegaly.  Aortic Atherosclerosis   2/8 brain mri 1. No evidence of acute intracranial abnormality on  this motion limited exam. 2. 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 3. Moderate chronic microvascular ischemic disease and generalized cerebral atrophy. 4. Left mastoid effusion.  Jabier Mutton, [facility] [place] [phone] pager    02/07/2021, 1:53 PM

## 2021-02-07 NOTE — Assessment & Plan Note (Addendum)
-  Etiology is considered secondary to her scleroderma - last note reviewed from Dr. [name] from last visit (11/2019), do not see a more recent one; at that time was to remain on revatio and macitentan (await med rec from pharmacy) - caution with respiratory status while on IVF and underlying dCHF. I have placed call to advanced CHF team to inform of patient in-house; currently no acute need for consult but will defer to team if they wish to consult or just remain in background available in case needed while patient hospitalized - currently no SOB and is on home O2 2-3L chronically

## 2021-02-07 NOTE — ED Notes (Signed)
Dinner Tray Ordered @ 1649. 

## 2021-02-07 NOTE — Assessment & Plan Note (Addendum)
-  Patient follows chronically with wound care center.  Her husband is very involved and dresses her wounds daily at home -Recently evaluated by plastic surgery on 01/20/2021.  Tentative plan was to continue on IV antibiotics with plans for debridement of devitalized tissue followed by application of wound matrix -Will inform plastic surgery that patient is in house in case of planned for performing surgery while here.  Per her husband, plan was to have had surgery on 02/06/2021 but was canceled due to her feeling poorly -Her Plavix has been on hold since Friday per husband; will continue to hold for now until further discussions from surgery - ID consulted for abx management while in house as well; saw ID (Dr. Gale Journey) on 01/24/21 and recommended for 2 weeks cefepime but unclear what first day of treatment technically was  -Apply Silvadene cream to lower extremity wounds, cover with ABD pad, and wrap with Kerlix

## 2021-02-07 NOTE — Assessment & Plan Note (Signed)
-  Previously elevated platelet count in January 2022.  Also seen by Dr. Lorenso Courier for this as well.  Differential had included reactive inflammation at that time from her underlying scleroderma and lower extremity wounds -If there was no improvement consideration was going to be made for possible bone marrow biopsy -On evaluation in the ER today, platelet count has returned back to normal range, currently 275k

## 2021-02-07 NOTE — Assessment & Plan Note (Signed)
-  patient has history of CKD3b. Baseline creat ~ 1.3, eGFR 40-45 - creat 2.02 and GFR 26 on admission - patient presents with increase in creat >0.3 mg/dL above baseline presumed to have occurred within past 7 days PTA -Has been reports that her appetite has been decreased for the past several days and she has also had ongoing diarrhea.  She has had no nausea or vomiting -At this time presumed prerenal -Continue fluids and caution with underlying pulm HTN and dCHF

## 2021-02-07 NOTE — ED Notes (Signed)
Pts bedding changed and patient repositioned. Husband remains at bedside.

## 2021-02-07 NOTE — ED Notes (Signed)
Right leq undressed. Saline applied for dressing removal. MD aware.

## 2021-02-07 NOTE — Hospital Course (Addendum)
[name] is a 70 yo female with extensive PMH including pulmonary HTN (followed by Dr. 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  She was recently evaluated by plastic surgery, Dr. [name] on 01/20/2021 and tentative plan was for debridement of devitalized tissue of her right lower extremity and application of a wound matrix.  Due to her underlying chronic comorbidities, wound healing has been the main focus of concern; and her husband understands she still may be facing possible amputation in the future if conservative measures are unsuccessful.  She has been on cefepime since 01/24/2021.  Recommendation per ID at that time was to continue on a 2-week course, the day of admission is completion of 2 weeks.  The reason for presenting to the ER per her husband is that she has had ongoing diarrhea and poor appetite for approximately the past week.  She has had no nausea/vomiting.  He states that she appears very weak/deconditioned and has been unable to stand and her speech has also been "mumbled", so he brought her to the ER for further evaluation.  After evaluation, she was found to have an elevated creatinine, 2.02 above her typical baseline of about 1.3.  She was placed on fluids and her husband stated that he already could tell some improvement especially with her speech returning to normal while in the ER.  Plan will be to admit patient for ongoing rehydration and she will be evaluated by ID and surgery while here.  Have also reached out to her CHF team in case of need for involvement while on fluids, but currently in the ER she was asymptomatic, tolerating fluids well, on her baseline oxygen demand of 2 to 3 L, and breathing comfortably.

## 2021-02-07 NOTE — H&P (Addendum)
History and Physical    [name] [name]  QJF:[id number]  DOB: [birth year]  DOA: 02/06/2021  PCP: [name], MD Patient coming from: home  Chief Complaint: weakness, decreased appetite   HPI:  [name] [name] is a 70 yo female with extensive PMH including pulmonary HTN (followed by Dr. 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  She was recently evaluated by plastic surgery, Dr. [name] on 01/20/2021 and tentative plan was for debridement of devitalized tissue of her right lower extremity and application of a wound matrix.  Due to her underlying chronic comorbidities, wound healing has been the main focus of concern; and her husband understands she still may be facing possible amputation in the future if conservative measures are unsuccessful.  She has been on cefepime since 01/24/2021.  Recommendation per ID at that time was to continue on a 2-week course, the day of admission is completion of 2 weeks.  The reason for presenting to the ER per her husband is that she has had ongoing diarrhea and poor appetite for approximately the past week.  She has had no nausea/vomiting.  He states that she appears very weak/deconditioned and has been unable to stand and her speech has also been "mumbled", so he brought her to the ER for further evaluation.  After evaluation, she was found to have an elevated creatinine, 2.02 above her typical baseline of about 1.3.  She was placed on fluids and her husband stated that he already could tell some improvement especially with her speech returning to normal while in the ER.  Plan will be to admit patient for ongoing rehydration and she will be evaluated by ID and surgery while here.  Have also reached out to her CHF team in case of need for involvement while on fluids, but currently in the ER she was asymptomatic, tolerating fluids well, on her baseline oxygen demand of 2 to 3 L,  and breathing comfortably.     I have personally briefly reviewed patient's old medical records in [facility] and discussed patient with the ER provider when appropriate/indicated.  Assessment/Plan: * Acute renal failure superimposed on stage 3a chronic kidney disease ([place]) - patient has history of CKD3b. Baseline creat ~ 1.3, eGFR 40-45 - creat 2.02 and GFR 26 on admission - patient presents with increase in creat >0.3 mg/dL above baseline presumed to have occurred within past 7 days PTA -Has been reports that her appetite has been decreased for the past several days and she has also had ongoing diarrhea.  She has had no nausea or vomiting -At this time presumed prerenal -Continue fluids and caution with underlying pulm HTN and dCHF   Non-healing wound of right lower extremity -Patient follows chronically with wound care center.  Her husband is very involved and dresses her wounds daily at home -Recently evaluated by plastic surgery on 01/20/2021.  Tentative plan was to continue on IV antibiotics with plans for debridement of devitalized tissue followed by application of wound matrix -Will inform plastic surgery that patient is in house in case of planned for performing surgery while here.  Per her husband, plan was to have had surgery on 02/06/2021 but was canceled due to her feeling poorly -Her Plavix has been on hold since Friday per husband; will continue to hold for now until further discussions from surgery - ID consulted for abx management while in house as well; saw ID (Dr. Gale Journey) on 01/24/21 and recommended  for 2 weeks cefepime but unclear what first day of treatment technically was  -Apply Silvadene cream to lower extremity wounds, cover with ABD pad, and wrap with Kerlix  Chronic diastolic CHF (congestive heart failure) (HCC) -No concern for volume overload at this time.  No obvious pulmonary edema or effusions on CXR. -Patient currently on fluids in setting of acute on chronic  renal failure -See discussion under pulmonary hypertension regarding CHF team -Continue fluids and monitor for any signs of volume overload  PAH (pulmonary artery hypertension) ([place]) -Etiology is considered secondary to her scleroderma - last note reviewed from Dr. [name] from last visit (11/2019), do not see a more recent one; at that time was to remain on revatio and macitentan (await med rec from pharmacy) - caution with respiratory status while on IVF and underlying dCHF. I have placed call to advanced CHF team to inform of patient in-house; currently no acute need for consult but will defer to team if they wish to consult or just remain in background available in case needed while patient hospitalized - currently no SOB and is on home O2 2-3L chronically  Physical deconditioning -Husband states patient is typically able to walk some at home although not long distances. She has become progressively weaker from her decreased appetite -Will ultimately need PT evaluation some before surgery but if has surgery while hospitalized, certainly after -Holding off on PT eval for now until more medically stable and able to partake in therapy better  Chronic respiratory failure with hypoxia ([place]) - on 2-3 L O2 at home; currently at baseline - covid swab pending - continue O2 and monitor status while on IVF  Thrombocytosis -Previously elevated platelet count in January 2022.  Also seen by Dr. Lorenso Courier for this as well.  Differential had included reactive inflammation at that time from her underlying scleroderma and lower extremity wounds -If there was no improvement consideration was going to be made for possible bone marrow biopsy -On evaluation in the ER today, platelet count has returned back to normal range, currently 275k  Macrocytic anemia - seen by Dr. Lorenso Courier for normocytic anemia in Jan 2022 - s/p iron infusions as well; too soon to follow up iron levels - given macrocytosis, check folate  and B12 now  Hypothyroidism -Check TSH -Continue home Synthroid after med rec complete    Code Status:     Code Status: Full Code  DVT Prophylaxis: HSQ     Anticipated disposition is to: pending PT eval   History: Past Medical History:  Diagnosis Date  . Anemia   . Arthritis   . DOE (dyspnea on exertion)   . Epistaxis   . History of chicken pox   . Hypertension   . Hypothyroidism   . Leg ulcer ([place])   . Melanoma ([place]) 1999  . 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[place])   . Non Hodgkin's lymphoma ([place])    Treated with chemo 2010, felt to be cured.  Marland Kitchen PAH (pulmonary artery hypertension) ([place])   . Peripheral arterial occlusive disease ([place])   . Pneumonia 2008  . Pulmonary hypertension ([place])   . Requires supplemental oxygen    2 liters-3liters when moving  . 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 ABDOMINAL AORTOGRAM W/LOWER EXTREMITY Bilateral 10/04/2020   Procedure: ABDOMINAL AORTOGRAM W/LOWER EXTREMITY;  Surgeon: Serafina Mitchell, MD;  Location: [place] CV LAB;  Service: Cardiovascular;  Laterality: Bilateral;  . ABDOMINAL AORTOGRAM W/LOWER EXTREMITY Bilateral  01/02/2021   Procedure: ABDOMINAL AORTOGRAM W/LOWER EXTREMITY;  Surgeon: Waynetta Sandy, MD;  Location: [place] CV LAB;  Service: Cardiovascular;  Laterality: Bilateral;  . AMPUTATION Right 10/24/2017   Procedure: RAY resection right index;  Surgeon: Daryll Brod, MD;  Location: [place];  Service: Orthopedics;  Laterality: Right;  Axillary block  . AMPUTATION Right 11/21/2018   Procedure: AMPUTATION RAY RIGHT MIDDLE FINGER;  Surgeon: Daryll Brod, MD;  Location: [place];  Service: Orthopedics;  Laterality: Right;  . AMPUTATION Right 10/22/2019   Procedure: AMPUTATION DIGIT RIGHT RING AND SMALL FINGER, DIGITAL SYMPATHECTOMY RIGHT RADIAL ULNAR ARCH;  Surgeon: Daryll Brod, MD;  Location: [place];  Service: Orthopedics;  Laterality: Right;  AXILARY  .  apligraph  12-04-2102,   12-18-2012   bilateral calves   [name]  . biopsy on cerebellum    . COLONOSCOPY WITH PROPOFOL Left 10/07/2015   Procedure: COLONOSCOPY WITH PROPOFOL;  Surgeon: [name], MD;  Location: [place];  Service: Endoscopy;  Laterality: Left;  . ENDARTERECTOMY FEMORAL Right 09/14/2016   Procedure: ENDARTERECTOMY FEMORAL RIGHT;  Surgeon: [name], MD;  Location: [facility] OR;  Service: Vascular;  Laterality: Right;  . ENDOVENOUS ABLATION SAPHENOUS VEIN W/ LASER  07-24-2012   right greater saphenous vein by [name], M.D.   . ENDOVENOUS ABLATION SAPHENOUS VEIN W/ LASER  10-16-2012   left greater saphenous vein  by Dr. [name]  . ENDOVENOUS ABLATION SAPHENOUS VEIN W/ LASER Right 09-01-2015   endovenous laser ablation right greater saphenous vein by [name] MD  . ESOPHAGOGASTRODUODENOSCOPY N/A 05/07/2016   Procedure: ESOPHAGOGASTRODUODENOSCOPY (EGD);  Surgeon: [name], MD;  Location: [facility] ENDOSCOPY;  Service: Endoscopy;  Laterality: N/A;  . ESOPHAGOGASTRODUODENOSCOPY N/A 11/23/2020   Procedure: ESOPHAGOGASTRODUODENOSCOPY (EGD);  Surgeon: [name], MD;  Location: [facility];  Service: Endoscopy;  Laterality: N/A;  . ESOPHAGOGASTRODUODENOSCOPY (EGD) WITH PROPOFOL Left 10/07/2015   Procedure: ESOPHAGOGASTRODUODENOSCOPY (EGD) WITH PROPOFOL;  Surgeon: [name], MD;  Location: [place];  Service: Endoscopy;  Laterality: Left;  . GIVENS CAPSULE STUDY N/A 07/31/2016   Procedure: GIVENS CAPSULE STUDY;  Surgeon: [name], MD;  Location: [facility] ENDOSCOPY;  Service: Endoscopy;  Laterality: N/A;  . GIVENS CAPSULE STUDY N/A 11/23/2020   Procedure: GIVENS CAPSULE STUDY;  Surgeon: [name], MD;  Location: [facility] ENDOSCOPY;  Service: Endoscopy;  Laterality: N/A;  . IR IVC FILTER PLMT / S&I Burke Keels GUID/MOD SED  11/22/2020  . LOWER EXTREMITY ANGIOGRAPHY N/A 10/25/2020   Procedure: LOWER EXTREMITY ANGIOGRAPHY - Left;  Surgeon: Serafina Mitchell, MD;  Location: [place] CV LAB;  Service: Cardiovascular;  Laterality: N/A;  . melonoma removes Right    behind right knee  . OPEN REDUCTION INTERNAL FIXATION (ORIF) DISTAL RADIAL FRACTURE Left 11/24/2019   Procedure: OPEN REDUCTION INTERNAL FIXATION (ORIF) DISTAL RADIAL FRACTURE;  Surgeon: Altamese Dickson, MD;  Location: [place];  Service: Orthopedics;  Laterality: Left;  . PATCH ANGIOPLASTY Right 09/14/2016   Procedure: PATCH ANGIOPLASTY RIGHT FEMORAL ARTERY USING HEMASHIELD PLATINUM FINESSE PATCH;  Surgeon: [name], MD;  Location: [place];  Service: Vascular;  Laterality: Right;  . PERIPHERAL VASCULAR BALLOON ANGIOPLASTY Right 10/04/2020   Procedure: PERIPHERAL VASCULAR BALLOON ANGIOPLASTY;  Surgeon: Serafina Mitchell, MD;  Location: [place] CV LAB;  Service: Cardiovascular;  Laterality: Right;  SFA/PT/AT/PTtrunk  . PERIPHERAL VASCULAR BALLOON ANGIOPLASTY Left 10/25/2020   Procedure: PERIPHERAL VASCULAR BALLOON ANGIOPLASTY;  Surgeon: Serafina Mitchell, MD;  Location: [place] CV LAB;  Service: Cardiovascular;  Laterality: Left;  anterior tibial and tibioperoneal trunk  . PERIPHERAL VASCULAR CATHETERIZATION N/A 09/07/2016   Procedure: Abdominal Aortogram w/Lower Extremity;  Surgeon: [name], MD;  Location: [place] CV LAB;  Service: Cardiovascular;  Laterality: N/A;  . RIGHT HEART CATH N/A 04/19/2017   Procedure: Right Heart Cath;  Surgeon: [name], MD;  Location: [place] CV LAB;  Service: Cardiovascular;  Laterality: N/A;  . RIGHT HEART CATHETERIZATION N/A 11/14/2012   Procedure: RIGHT HEART CATH;  Surgeon: [name], MD;  Location: [facility] CATH LAB;  Service: Cardiovascular;  Laterality: N/A;  . rt little toe removal  10/1998  . stent left thigh  3/11  . TENDON TRANSFER Right 10/24/2017   Procedure: transfer extensor indecus propius/extensor digitorum commomus index to middle ring and small;  Surgeon: Daryll Brod, MD;  Location: [place];  Service: Orthopedics;  Laterality:  Right;  . TONSILLECTOMY    . TONSILLECTOMY AND ADENOIDECTOMY    . TUNNELED VENOUS CATHETER PLACEMENT  01/2013  . ULNAR HEAD EXCISION Right 10/24/2017   Procedure: darrach right ulna;  Surgeon: Daryll Brod, MD;  Location: [place];  Service: Orthopedics;  Laterality: Right;  . VEIN LIGATION AND STRIPPING  05/16/2012   Procedure: VEIN LIGATION AND STRIPPING;  Surgeon: [name], MD;  Location: [facility] OR;  Service: Vascular;  Laterality: Right;  Irrigation and Debridement right lower leg, ligation of saphenous vein.     reports that she has never smoked. She has never used smokeless tobacco. She reports current alcohol use of about 7.0 standard drinks of alcohol per week. She reports that she does not use drugs.  Allergies  Allergen Reactions  . Levofloxacin Other (See Comments)    FLU LIKE SYMPTOM   . Sulfa Antibiotics Rash and Other (See Comments)    Face peeled Severe rash with significant peeling of face. No airway involvement per patient   . Doxycycline Hyclate Swelling    SWELLING REACTION UNSPECIFIED  "TABLETS ONLY - CAPSULES ARE TOLERATED FINE"  . Penicillins Rash    Has patient had a PCN reaction causing immediate rash, facial/tongue/throat swelling, SOB or lightheadedness with hypotension: no Has patient had a PCN reaction causing severe rash involving mucus membranes or skin necrosis: no Has patient had a PCN reaction that required hospitalization: no Has patient had a PCN reaction occurring within the last 10 years: {no If all of the above answers are "NO", then may proceed with Cephalosporin use.    Family History  Problem Relation Age of Onset  . Lupus Father   . Lymphoma Father   . Hyperlipidemia Mother   . Cancer Daughter        sarcoma  . Colon cancer Other    Home Medications: Prior to Admission medications   Medication Sig Start Date End Date Taking? Authorizing Provider  acetaminophen (TYLENOL) 650 MG CR tablet Take 1,300 mg by mouth every 8 (eight) hours as  needed for pain.    [provider]  Biotin 5000 MCG CAPS Take 5,000 mcg by mouth daily.    [provider]  Calcium Carbonate-Vit D-Min (CALCIUM 1200 PO) Take 1 tablet by mouth at bedtime.    [provider]  Cholecalciferol (VITAMIN D) 2000 units CAPS Take 2,000 Units by mouth at bedtime.    [provider]  citalopram (CELEXA) 40 MG tablet TAKE 1 TABLET BY MOUTH EVERY DAY Patient taking differently: Take 40 mg by mouth at bedtime. 01/13/21   [name]  J, MD  clopidogrel (PLAVIX) 75 MG tablet TAKE 1 TABLET BY MOUTH EVERY DAY Patient taking differently: Take 75 mg by mouth daily. 01/05/21   Bensimhon, Shaune Pascal, MD  cycloSPORINE (RESTASIS) 0.05 % ophthalmic emulsion Place 2 drops into both eyes 2 (two) times daily.    [provider]  FERREX 150 150 MG capsule TAKE 1 CAPSULE BY MOUTH TWICE A DAY Patient taking differently: Take 150 mg by mouth 2 (two) times daily. 01/05/21   [name], MD  fish oil-omega-3 fatty acids 1000 MG capsule Take 1,000 mg by mouth 2 (two) times daily.    [provider]  folic acid (FOLVITE) 1 MG tablet TAKE 1 TABLET BY MOUTH EVERY DAY Patient taking differently: Take 1 mg by mouth daily. 01/05/21   [name], MD  HYDROcodone-acetaminophen (NORCO) 5-325 MG tablet One tab by mouth with food sparingly up to 3 times daily as needed for leg wound pain Patient not taking: No sig reported 01/20/21   [name], MD  levothyroxine (SYNTHROID) 50 MCG tablet TAKE 1 TABLET BY MOUTH DAILY BEFORE BREAKFAST Patient taking differently: Take 50 mcg by mouth daily before breakfast. 08/31/20   Baxley, Cresenciano Lick, MD  loperamide (IMODIUM) 2 MG capsule Take 2-4 mg by mouth 4 (four) times daily as needed for diarrhea or loose stools.    [provider]  MAGNESIUM ASPARTATE PO Take 150 mg by mouth in the morning and at bedtime.    [provider]  Multiple Vitamin (MULTIVITAMIN WITH MINERALS) TABS tablet Take 1 tablet  by mouth daily at 12 noon.    [provider]  OPSUMIT 10 MG tablet Take 1 tablet (5m) by mouth daily. Patient taking differently: Take 10 mg by mouth daily. 04/29/20   Bensimhon, DShaune Pascal MD  OXYGEN Inhale 2-3 L into the lungs continuous.     [provider]  pantoprazole (PROTONIX) 40 MG tablet Take 1 tablet (40 mg total) by mouth 2 (two) times daily. 10/05/20   BElby Showers MD  potassium chloride 20 MEQ/15ML (10%) SOLN TAKE 2 TEASPOONFUL (DILUTED IN WATER) ONCE DAILY Patient taking differently: Take 20 mEq by mouth daily. 08/02/20   BElby Showers MD  predniSONE (DELTASONE) 5 MG tablet Take 5 mg by mouth daily. 04/04/20   [provider]  rosuvastatin (CRESTOR) 5 MG tablet Take 1 tablet every Monday, Wednesday, and Friday. Patient taking differently: Take 5 mg by mouth every Monday, Wednesday, and Friday. 08/29/20   Bensimhon, DShaune Pascal MD  sildenafil (REVATIO) 20 MG tablet Take 3 tablets (60 mg total) by mouth 3 (three) times daily. 02/02/21   Bensimhon, DShaune Pascal MD  spironolactone (ALDACTONE) 25 MG tablet TAKE 1 TABLET BY MOUTH EVERY DAY Patient taking differently: Take 25 mg by mouth daily. 01/09/21   Bensimhon, DShaune Pascal MD  torsemide (DEMADEX) 20 MG tablet Take 20-40 mg by mouth See admin instructions. Take 2 tablets (40 mg) by mouth in the morning & take 1 tablet (20 mg) by mouth at night.    [provider]  traMADol (ULTRAM) 50 MG tablet Take 50 mg by mouth every 6 (six) hours as needed for moderate pain or severe pain. 11/28/20   [provider]  valACYclovir (VALTREX) 500 MG tablet Take 1 tablet (500 mg total) by mouth 2 (two) times daily. Patient taking differently: No sig reported 09/02/19   BElby Showers MD  vitamin B-12 (CYANOCOBALAMIN) 1000 MCG tablet Take 1,000 mcg by mouth  daily.    [provider]    Review of Systems:  Pertinent items noted in HPI and remainder of comprehensive ROS otherwise negative.  Physical  Exam: Vitals:   02/07/21 0624 02/07/21 0830 02/07/21 0930 02/07/21 1200  BP: (!) 143/88 (!) 167/83 (!) 160/100 (!) 147/59  Pulse: 79 70 70 77  Resp: 16  17 (!) 23  Temp:      TempSrc:      SpO2: 93% (!) 74% 100% 100%   General appearance: slightly unkempt elderly woman who is very chronically ill appearing and is cachectic in bed in NAD and is comfortable too Head: Normocephalic, without obvious abnormality, atraumatic Eyes: EOMI Lungs: clear to auscultation bilaterally Heart: regular rate and rhythm and S1, S2 normal Abdomen: thin, soft, NT, ND, BS present Extremities: R hand has 2 contracted digits (remainder have been amputated). Left hand is slightly contracted but does have ability to make fist; palmar aspect of left thumb has hard calcification noted. LLE is wrapped is old bandage with flaking skin underneath and chronic wound. Right lower extremity covered in gauze but has chronic open wound with necrotic tissue noted (pic available in ER note) and the RLE wound is TTP Skin: diffuse xerosis; extra skin throughout from weight loss Neurologic: R hand contracted and hardened with no movement; has symmetric strength in LUE and B/L LE; AOx3, no dysarthria (at time of my exam, husband states her speech is back to normal)  Labs on Admission:  I have personally reviewed following labs and imaging studies Results for orders placed or performed during the hospital encounter of 02/06/21 (from the past 24 hour(s))  CBC with Differential     Status: Abnormal   Collection Time: 02/07/21  9:26 AM  Result Value Ref Range   WBC 10.3 4.0 - 10.5 K/uL   RBC 3.06 (L) 3.87 - 5.11 MIL/uL   Hemoglobin 9.4 (L) 12.0 - 15.0 g/dL   HCT 30.2 (L) 36.0 - 46.0 %   MCV 98.7 80.0 - 100.0 fL   MCH 30.7 26.0 - 34.0 pg   MCHC 31.1 30.0 - 36.0 g/dL   RDW 18.6 (H) 11.5 - 15.5 %   Platelets 267 150 - 400 K/uL   nRBC 0.0 0.0 - 0.2 %   Neutrophils Relative % 78 %   Neutro Abs 8.1 (H) 1.7 - 7.7 K/uL   Lymphocytes  Relative 8 %   Lymphs Abs 0.8 0.7 - 4.0 K/uL   Monocytes Relative 10 %   Monocytes Absolute 1.0 0.1 - 1.0 K/uL   Eosinophils Relative 1 %   Eosinophils Absolute 0.1 0.0 - 0.5 K/uL   Basophils Relative 1 %   Basophils Absolute 0.1 0.0 - 0.1 K/uL   Immature Granulocytes 2 %   Abs Immature Granulocytes 0.20 (H) 0.00 - 0.07 K/uL  Basic metabolic panel     Status: Abnormal   Collection Time: 02/07/21  9:26 AM  Result Value Ref Range   Sodium 135 135 - 145 mmol/L   Potassium 3.6 3.5 - 5.1 mmol/L   Chloride 98 98 - 111 mmol/L   CO2 21 (L) 22 - 32 mmol/L   Glucose, Bld 73 70 - 99 mg/dL   BUN 36 (H) 8 - 23 mg/dL   Creatinine, Ser 2.02 (H) 0.44 - 1.00 mg/dL   Calcium 8.6 (L) 8.9 - 10.3 mg/dL   GFR, Estimated 26 (L) >60 mL/min   Anion gap 16 (H) 5 - 15  Comprehensive metabolic panel  Status: Abnormal   Collection Time: 02/07/21 11:54 AM  Result Value Ref Range   Sodium 137 135 - 145 mmol/L   Potassium 3.6 3.5 - 5.1 mmol/L   Chloride 100 98 - 111 mmol/L   CO2 21 (L) 22 - 32 mmol/L   Glucose, Bld 74 70 - 99 mg/dL   BUN 35 (H) 8 - 23 mg/dL   Creatinine, Ser 1.91 (H) 0.44 - 1.00 mg/dL   Calcium 8.1 (L) 8.9 - 10.3 mg/dL   Total Protein 5.9 (L) 6.5 - 8.1 g/dL   Albumin 2.4 (L) 3.5 - 5.0 g/dL   AST 25 15 - 41 U/L   ALT 15 0 - 44 U/L   Alkaline Phosphatase 70 38 - 126 U/L   Total Bilirubin 0.8 0.3 - 1.2 mg/dL   GFR, Estimated 28 (L) >60 mL/min   Anion gap 16 (H) 5 - 15  Lipase, blood     Status: Abnormal   Collection Time: 02/07/21 11:54 AM  Result Value Ref Range   Lipase 113 (H) 11 - 51 U/L  CBC WITH DIFFERENTIAL     Status: Abnormal   Collection Time: 02/07/21 11:54 AM  Result Value Ref Range   WBC 10.3 4.0 - 10.5 K/uL   RBC 3.01 (L) 3.87 - 5.11 MIL/uL   Hemoglobin 9.0 (L) 12.0 - 15.0 g/dL   HCT 30.4 (L) 36.0 - 46.0 %   MCV 101.0 (H) 80.0 - 100.0 fL   MCH 29.9 26.0 - 34.0 pg   MCHC 29.6 (L) 30.0 - 36.0 g/dL   RDW 18.6 (H) 11.5 - 15.5 %   Platelets 275 150 - 400 K/uL    nRBC 0.0 0.0 - 0.2 %   Neutrophils Relative % 79 %   Neutro Abs 8.2 (H) 1.7 - 7.7 K/uL   Lymphocytes Relative 9 %   Lymphs Abs 0.9 0.7 - 4.0 K/uL   Monocytes Relative 9 %   Monocytes Absolute 0.9 0.1 - 1.0 K/uL   Eosinophils Relative 1 %   Eosinophils Absolute 0.1 0.0 - 0.5 K/uL   Basophils Relative 0 %   Basophils Absolute 0.0 0.0 - 0.1 K/uL   Immature Granulocytes 2 %   Abs Immature Granulocytes 0.17 (H) 0.00 - 0.07 K/uL  Urinalysis, Routine w reflex microscopic     Status: Abnormal   Collection Time: 02/07/21 12:47 PM  Result Value Ref Range   Color, Urine YELLOW YELLOW   APPearance CLEAR CLEAR   Specific Gravity, Urine 1.025 1.005 - 1.030   pH 5.0 5.0 - 8.0   Glucose, UA NEGATIVE NEGATIVE mg/dL   Hgb urine dipstick SMALL (A) NEGATIVE   Bilirubin Urine NEGATIVE NEGATIVE   Ketones, ur 5 (A) NEGATIVE mg/dL   Protein, ur 100 (A) NEGATIVE mg/dL   Nitrite NEGATIVE NEGATIVE   Leukocytes,Ua NEGATIVE NEGATIVE   RBC / HPF 0-5 0 - 5 RBC/hpf   WBC, UA 0-5 0 - 5 WBC/hpf   Bacteria, UA RARE (A) NONE SEEN   *Note: Due to a large number of results and/or encounters for the requested time period, some results have not been displayed. A complete set of results can be found in Results Review.     Radiological Exams on Admission: CT Angio Head W or Wo Contrast  Result Date: 02/05/2021 CLINICAL DATA:  Neuro deficit, acute stroke suspected.  New aphasia. EXAM: CT ANGIOGRAPHY HEAD AND NECK TECHNIQUE: Multidetector CT imaging of the head and neck was performed using the standard protocol during bolus administration  of intravenous contrast. Multiplanar CT image reconstructions and MIPs were obtained to evaluate the vascular anatomy. Carotid stenosis measurements (when applicable) are obtained utilizing NASCET criteria, using the distal internal carotid diameter as the denominator. CONTRAST:  154m OMNIPAQUE IOHEXOL 350 MG/ML SOLN COMPARISON:  CT head December 14, 2020. CTA November 11, 2019 FINDINGS:  CT HEAD FINDINGS Brain: High left remote parietal cortical infarct, similar to prior. Remote right cerebellar lacunar infarct. No evidence of acute large vascular territory infarct. Patchy white matter hypoattenuation, likely the sequela of chronic microvascular ischemic disease. No acute hemorrhage. No hydrocephalus. Similar mild diffuse cerebral atrophy with ex vacuo ventricular dilation. No mass lesion or abnormal mass effect. Skull: No acute fracture. Prior right occipital craniectomy with cranioplasty. Sinuses: Sinuses are largely clear. Orbits: No acute findings. Review of the MIP images confirms the above findings CTA NECK FINDINGS Aortic arch: Calcific atherosclerosis. Great vessel origins are patent. Right carotid system: Predominately calcific atherosclerosis at the carotid bifurcation without evidence of greater than 50% stenosis. Left carotid system: Predominately calcific atherosclerosis at the carotid bifurcation without evidence of greater than 50% stenosis. Vertebral arteries: Mild atherosclerotic narrowing of bilateral vertebral artery origins. Mild to moderate stenosis of the distal right vertebral artery. Otherwise, no significant stenosis. Codominant. 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 Other neck: No suspicious mass or adenopathy Upper chest: Partially imaged diffuse nodular and ground-glass opacities in the lung apices, possibly slightly progressed since prior CT chest from 01/21/2019 and further characterized on that study. Esophageal dilation with debris within in the partially visualized lower esophagus Review of the MIP images confirms the above findings CTA HEAD FINDINGS Anterior circulation: Mild-to-moderate bilateral cavernous carotid narrowing secondary to predominately calcific atherosclerosis. Otherwise, no significant stenosis, proximal occlusion, aneurysm, or  vascular malformation. Posterior circulation: Mild to moderate stenosis of the distal right vertebral artery. Otherwise, no significant stenosis. No large vessel occlusion. No aneurysm. Venous sinuses: As permitted by contrast timing, patent. Review of the MIP images confirms the above findings IMPRESSION: CT head: 1. No evidence of acute intracranial abnormality. 2. 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 3. Chronic microvascular ischemic disease. CTA: 1. No emergent large vessel occlusion. 2. Similar mild to moderate bilateral cavernous carotid atherosclerosis. 3. Similar mild to moderate distal right vertebral artery stenosis. 4. Partially imaged diffuse nodular and ground-glass opacities in the lung apices, possibly slightly progressed since prior CT chest from 01/21/2019 and further characterized on that study. A repeat high-resolution CT of the chest could further evaluate if clinically indicated. 5. Esophageal dilation with debris within in the partially visualized lower esophagus, compatible with history of scleroderma. This places the patient at risk for aspiration. Electronically Signed   By: FMargaretha SheffieldMD   On: 02/05/2021 19:09   CT Angio Neck W and/or Wo Contrast  Result Date: 02/05/2021 CLINICAL DATA:  Neuro deficit, acute stroke suspected.  New aphasia. EXAM: CT ANGIOGRAPHY HEAD AND NECK TECHNIQUE: Multidetector CT imaging of the head and neck was performed using the standard protocol during bolus administration of intravenous contrast. Multiplanar CT image reconstructions and MIPs were obtained to evaluate the vascular anatomy. Carotid stenosis measurements (when applicable) are obtained utilizing NASCET criteria, using the distal internal carotid diameter as the denominator. CONTRAST:  1040mOMNIPAQUE IOHEXOL 350 MG/ML SOLN COMPARISON:  CT head December 14, 2020. CTA November 11, 2019 FINDINGS: CT HEAD FINDINGS Brain: High left remote  parietal  cortical infarct, similar to prior. Remote right cerebellar lacunar infarct. No evidence of acute large vascular territory infarct. Patchy white matter hypoattenuation, likely the sequela of chronic microvascular ischemic disease. No acute hemorrhage. No hydrocephalus. Similar mild diffuse cerebral atrophy with ex vacuo ventricular dilation. No mass lesion or abnormal mass effect. Skull: No acute fracture. Prior right occipital craniectomy with cranioplasty. Sinuses: Sinuses are largely clear. Orbits: No acute findings. Review of the MIP images confirms the above findings CTA NECK FINDINGS Aortic arch: Calcific atherosclerosis. Great vessel origins are patent. Right carotid system: Predominately calcific atherosclerosis at the carotid bifurcation without evidence of greater than 50% stenosis. Left carotid system: Predominately calcific atherosclerosis at the carotid bifurcation without evidence of greater than 50% stenosis. Vertebral arteries: Mild atherosclerotic narrowing of bilateral vertebral artery origins. Mild to moderate stenosis of the distal right vertebral artery. Otherwise, no significant stenosis. Codominant. 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 Other neck: No suspicious mass or adenopathy Upper chest: Partially imaged diffuse nodular and ground-glass opacities in the lung apices, possibly slightly progressed since prior CT chest from 01/21/2019 and further characterized on that study. Esophageal dilation with debris within in the partially visualized lower esophagus Review of the MIP images confirms the above findings CTA HEAD FINDINGS Anterior circulation: Mild-to-moderate bilateral cavernous carotid narrowing secondary to predominately calcific atherosclerosis. Otherwise, no significant stenosis, proximal occlusion, aneurysm, or vascular malformation. Posterior circulation: Mild  to moderate stenosis of the distal right vertebral artery. Otherwise, no significant stenosis. No large vessel occlusion. No aneurysm. Venous sinuses: As permitted by contrast timing, patent. Review of the MIP images confirms the above findings IMPRESSION: CT head: 1. No evidence of acute intracranial abnormality. 2. 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 3. Chronic microvascular ischemic disease. CTA: 1. No emergent large vessel occlusion. 2. Similar mild to moderate bilateral cavernous carotid atherosclerosis. 3. Similar mild to moderate distal right vertebral artery stenosis. 4. Partially imaged diffuse nodular and ground-glass opacities in the lung apices, possibly slightly progressed since prior CT chest from 01/21/2019 and further characterized on that study. A repeat high-resolution CT of the chest could further evaluate if clinically indicated. 5. Esophageal dilation with debris within in the partially visualized lower esophagus, compatible with history of scleroderma. This places the patient at risk for aspiration. Electronically Signed   By: Margaretha Sheffield MD   On: 02/05/2021 19:09   MR BRAIN WO CONTRAST  Result Date: 02/07/2021 CLINICAL DATA:  Weakness.  Mental status change. EXAM: MRI HEAD WITHOUT CONTRAST TECHNIQUE: Multiplanar, multiecho pulse sequences of the brain and surrounding structures were obtained without intravenous contrast. COMPARISON:  CT February 05, 2021. FINDINGS: Motion limited exam. Brain: No acute infarction, acute hemorrhage, hydrocephalus, extra-axial collection or mass lesion. Moderate diffuse cerebral atrophy with ex vacuo ventricular dilation. Moderate patchy T2/FLAIR hyperintensities within the white matter and pons, likely the sequela of chronic microvascular ischemic disease. 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 Remote right sell[place] bell ir lacunar infarct. Surrounding gliosis. Vascular: Major arterial  flow voids are maintained at the skull base. Skull and upper cervical spine: Normal marrow signal. Sinuses/Orbits: Sinuses are largely clear.  Unremarkable orbits. Other: Left mastoid effusion. IMPRESSION: 1. No evidence of acute intracranial abnormality on this motion limited exam. 2. 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 3. Moderate chronic microvascular ischemic disease and generalized cerebral  atrophy. 4. Left mastoid effusion. Electronically Signed   By: Margaretha Sheffield MD   On: 02/07/2021 10:45   DG Chest Port 1 View  Result Date: 02/07/2021 CLINICAL DATA:  Weakness.  Central catheter placement EXAM: PORTABLE CHEST 1 VIEW COMPARISON:  November 21, 2020 FINDINGS: Central catheter tip is at the cavoatrial junction. No pneumothorax. There is interstitial thickening without frank edema or consolidation. Heart is mildly enlarged with pulmonary vascularity normal. No adenopathy. There is aortic atherosclerosis. No bone lesions. IMPRESSION: Central catheter tip at cavoatrial junction. No pneumothorax. Central interstitial thickening, likely indicative of a degree of chronic bronchitis. No edema or airspace opacity. Mild cardiomegaly. Aortic Atherosclerosis (ICD10-I70.0). Electronically Signed   By: Lowella Grip III M.D.   On: 02/07/2021 09:25   MR BRAIN WO CONTRAST  Final Result    DG Chest Port 1 View  Final Result      Consults called:  ID Plastic surgery called to inform of patient in hospital Advanced heart failure team called; not formally consulting yet  EKG: Independently reviewed. NSR, PVC, QTc 496   Dwyane Dee, MD [facility] 02/07/2021, 2:07 PM

## 2021-02-07 NOTE — ED Notes (Signed)
Pt to MRI via stretcher in stable condition

## 2021-02-07 NOTE — Consult Note (Addendum)
Advanced Heart Failure Team Consult Note   Primary Physician: [name], MD PCP-Cardiologist:  Glori Bickers, MD  Reason for Consultation: Management of Pulmonary Hypertension   HPI:    [name] [name] is seen today for evaluation of management of pulmonary hypertension at the request of Dr. Sabino Gasser, Internal Medicine.   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      PAH Meds:  1. Started on IV epoprostenol (Veletri) in 01/2013. She had intolerable side effects with Veletri.   2. Stopped Tyvaso in 2015.  3. February 2016 switched bosentan to Macitentan  4. Macitentan 10 and sildenafil60 tid (did not tolerate Adcirca).  5. Selexipag added 5/18 - stopped due to SEs    On 10/22/19 underwent amputation of R 4th and 5th fingers. She has also had prior amputation of multiple toes. She has previously had bilateral femoral endarterectomies by Dr. [name]. She has also had previous laser ablation of her right and left greater saphenous vein by Dr. [name]. She has also had angioplasty of her left tibioperoneal trunk and anterior tibial artery by Dr. Trula Slade in October 2021.    Had a fall in November 2020 broke her pelvis and left wrist.   Has chronic non-healing wound on RLE. Followed closely by the wound center, ID and plastic surgery. She has been on cefepime since 01/24/2021. Plan is for debridement of devitalized tissue followed by application of wound matrix after completing 2 weeks of cefepime.   She now presents to the ED w/ CC of ongoing diarrhea and poor appetite for approximately the past week. Found to have an AKI w/ SCr elevated at 2.0 (baseline ~1.2).  She is afebrile and WBC WNL at 10. CO2 low at 21.   She is being admitted for AKI and treatment w/ IVFs. AHF consulted to assist w/ management of  pulmonary hypertension/ close monitoring of volume status given need to treat w/ IVFs for AKI. She is currently receiving NaCl continuous infusion at 100 mL/hr.   Scr has improved today, down to 1.91.     Cardiac Studies  Echo 3/21: LVEF 60-65% RV mildly dilated but function normal Personally reviewed   Echo 01/16/2019 LVEF 60-65%, PA peak pressure 58 mm Hg    Echo 12/18 EF 60-65% RV dilated with mildly to moderately decreased function    Studies    6 MW 04/18/12 880 feet  6 MW 11/10/12 650 feet. Limited by legs. Sats down to 85% on 5L (sats at rest 95%)  6 MW 3/15 915 feet (279 m)  6 MW 2/16 930 feet (283 m)      RHC 04/19/17    RA = 4  RV = 80/8  PA = 79/17 (43)  PCW = 6  Fick cardiac output/index = 4.1/2.4  PVR = 9.0 WU  Ao sat = 94%  PA sat = 59%, 62%     Review of Systems: [y] = yes, _0  = no   . General: Weight gain _1 ; Weight loss _2 ; Anorexia _3 ; Fatigue [Y ]; Fever _4 ; Chills _5 ; Weakness [Y ]  . Cardiac: Chest pain/pressure _6 ; Resting SOB _7 ; Exertional SOB _8 ; Orthopnea _9 ; Pedal Edema _10 ; Palpitations _11 ; Syncope _12 ; Presyncope _13 ; Paroxysmal nocturnal dyspnea_14   . Pulmonary: Cough _15 ;  Wheezing_0 ; Hemoptysis_1 ; Sputum _2 ; Snoring _3   . GI: Vomiting_4 ; Dysphagia_5 ; Melena_6 ; Hematochezia _7 ; Heartburn_8 ; Abdominal pain _9 ; Constipation _10 ; Diarrhea _11 ; BRBPR _12   . GU: Hematuria_13 ; Dysuria _14 ; Nocturia_15   . Vascular: Pain in legs with walking _16 ; Pain in feet with lying flat _17 ; Non-healing sores [ Y]; Stroke _18 ; TIA _19 ; Slurred speech _20 ;  . Neuro: Headaches_21 ; Vertigo_22 ; Seizures_23 ; Paresthesias_24 ;Blurred vision _25 ; Diplopia _26 ; Vision changes _27   . Ortho/Skin: Arthritis _28 ; Joint pain _29 ; Muscle pain _30 ; Joint swelling _31 ; Back Pain _32 ; Rash _33   . Psych: Depression_34 ; Anxiety_35   . Heme: Bleeding problems _36 ; Clotting disorders _37 ; Anemia _38   . Endocrine: Diabetes _39 ; Thyroid dysfunction_40   Home  Medications Prior to Admission medications   Medication Sig Start Date End Date Taking? Authorizing Provider  acetaminophen (TYLENOL) 650 MG CR tablet Take 1,300 mg by mouth every 8 (eight) hours as needed for pain.   Yes [provider]  Biotin 5000 MCG CAPS Take 5,000 mcg by mouth daily.   Yes [provider]  Calcium Carbonate-Vit D-Min (CALCIUM 1200 PO) Take 1 tablet by mouth at bedtime.   Yes [provider]  ceFEPIme (MAXIPIME) 2 g injection 2 g every 12 (twelve) hours. 01/31/21  Yes [provider]  Cholecalciferol (VITAMIN D) 2000 units CAPS Take 2,000 Units by mouth at bedtime.   Yes [provider]  citalopram (CELEXA) 40 MG tablet TAKE 1 TABLET BY MOUTH EVERY DAY Patient taking differently: Take 40 mg by mouth at bedtime. 01/13/21  Yes Baxley, Cresenciano Lick, MD  clopidogrel (PLAVIX) 75 MG tablet TAKE 1 TABLET BY MOUTH EVERY DAY Patient taking differently: Take 75 mg by mouth daily. 01/05/21  Yes Seng Fouts, Shaune Pascal, MD  cycloSPORINE (RESTASIS) 0.05 % ophthalmic emulsion Place 2 drops into both eyes 2 (two) times daily.   Yes [provider]  FERREX 150 150 MG capsule TAKE 1 CAPSULE BY MOUTH TWICE A DAY Patient taking differently: Take 150 mg by mouth 2 (two) times daily. 01/05/21  Yes Baxley, Cresenciano Lick, MD  fish oil-omega-3 fatty acids 1000 MG capsule Take 1,000 mg by mouth 2 (two) times daily.   Yes [provider]  folic acid (FOLVITE) 1 MG tablet TAKE 1 TABLET BY MOUTH EVERY DAY Patient taking differently: Take 1 mg by mouth daily. 01/05/21  Yes Baxley, Cresenciano Lick, MD  levothyroxine (SYNTHROID) 50 MCG tablet TAKE 1 TABLET BY MOUTH DAILY BEFORE BREAKFAST Patient taking differently: Take 50 mcg by mouth daily before breakfast. 08/31/20  Yes Baxley, Cresenciano Lick, MD  loperamide (IMODIUM) 2 MG capsule Take 2-4 mg by mouth 4 (four) times daily as needed for diarrhea or loose stools.   Yes [provider]  MAGNESIUM ASPARTATE PO Take 150 mg by  mouth in the morning and at bedtime.   Yes [provider]  Multiple Vitamin (MULTIVITAMIN WITH MINERALS) TABS tablet Take 1 tablet by mouth daily at 12 noon.   Yes [provider]  OPSUMIT 10 MG tablet Take 1 tablet (64m) by mouth daily. Patient taking differently: Take 10 mg by mouth daily. 04/29/20  Yes Remiel Corti, DShaune Pascal MD  pantoprazole (PROTONIX) 40 MG tablet Take 1 tablet (40 mg total) by mouth 2 (two) times daily. 10/05/20  Yes Baxley, MCresenciano Lick  MD  potassium chloride 20 MEQ/15ML (10%) SOLN TAKE 2 TEASPOONFUL (DILUTED IN WATER) ONCE DAILY Patient taking differently: Take 20 mEq by mouth daily. 08/02/20  Yes Baxley, Cresenciano Lick, MD  predniSONE (DELTASONE) 5 MG tablet Take 5 mg by mouth daily. 04/04/20  Yes [provider]  rosuvastatin (CRESTOR) 5 MG tablet Take 1 tablet every Monday, Wednesday, and Friday. Patient taking differently: Take 5 mg by mouth every Monday, Wednesday, and Friday. 08/29/20  Yes Kennieth Plotts, Shaune Pascal, MD  sildenafil (REVATIO) 20 MG tablet Take 3 tablets (60 mg total) by mouth 3 (three) times daily. 02/02/21  Yes Madi Bonfiglio, Shaune Pascal, MD  spironolactone (ALDACTONE) 25 MG tablet TAKE 1 TABLET BY MOUTH EVERY DAY Patient taking differently: Take 25 mg by mouth daily. 01/09/21  Yes Habib Kise, Shaune Pascal, MD  torsemide (DEMADEX) 20 MG tablet Take 20-40 mg by mouth See admin instructions. Take 2 tablets (40 mg) by mouth in the morning & take 1 tablet (20 mg) by mouth at night.   Yes [provider]  traMADol (ULTRAM) 50 MG tablet Take 50 mg by mouth every 6 (six) hours as needed for moderate pain or severe pain. 11/28/20  Yes [provider]  valACYclovir (VALTREX) 500 MG tablet Take 1 tablet (500 mg total) by mouth 2 (two) times daily. Patient taking differently: Take 500 mg by mouth 2 (two) times daily as needed (lip sore). 09/02/19  Yes Baxley, Cresenciano Lick, MD  vitamin B-12 (CYANOCOBALAMIN) 1000 MCG tablet Take 1,000 mcg by mouth daily.   Yes [provider]  HYDROcodone-acetaminophen ([place]) 5-325 MG tablet One tab by mouth with food sparingly up to 3 times daily as needed for leg wound pain Patient not taking: Reported on 02/07/2021 01/20/21   [name], MD  OXYGEN Inhale 2-3 L into the lungs continuous.     [provider]    Past Medical History: Past Medical History:  Diagnosis Date  . Anemia   . Arthritis   . DOE (dyspnea on exertion)   . Epistaxis   . History of chicken pox   . Hypertension   . Hypothyroidism   . Leg ulcer ([place])   . Melanoma ([place]) 1999  . 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[place])   . Non Hodgkin's lymphoma ([place])    Treated with chemo 2010, felt to be cured.  Marland Kitchen PAH (pulmonary artery hypertension) ([place])   . Peripheral arterial occlusive disease ([place])   . Pneumonia 2008  . Pulmonary hypertension ([place])   . Requires supplemental oxygen    2 liters-3liters when moving  . 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 ABDOMINAL AORTOGRAM W/LOWER EXTREMITY Bilateral 10/04/2020   Procedure: ABDOMINAL AORTOGRAM W/LOWER EXTREMITY;  Surgeon: Serafina Mitchell, MD;  Location: [place] [facility];  Service: Cardiovascular;  Laterality: Bilateral;  . ABDOMINAL AORTOGRAM W/LOWER EXTREMITY Bilateral 01/02/2021   Procedure: ABDOMINAL AORTOGRAM W/LOWER EXTREMITY;  Surgeon: Waynetta Sandy, MD;  Location: [place] [facility];  Service: Cardiovascular;  Laterality: Bilateral;  . AMPUTATION Right 10/24/2017   Procedure: RAY resection right index;  Surgeon: Daryll Brod, MD;  Location: [place];  Service: Orthopedics;  Laterality: Right;  Axillary block  . AMPUTATION Right 11/21/2018   Procedure: AMPUTATION RAY RIGHT MIDDLE FINGER;  Surgeon: Daryll Brod, MD;  Location: [place];  Service: Orthopedics;  Laterality: Right;  . AMPUTATION Right 10/22/2019   Procedure: AMPUTATION DIGIT RIGHT RING AND SMALL FINGER,  DIGITAL SYMPATHECTOMY  RIGHT RADIAL ULNAR ARCH;  Surgeon: Daryll Brod, MD;  Location: [place];  Service: Orthopedics;  Laterality: Right;  AXILARY  . apligraph  12-04-2102,   12-18-2012   bilateral calves   [name]  . biopsy on cerebellum    . COLONOSCOPY WITH PROPOFOL Left 10/07/2015   Procedure: COLONOSCOPY WITH PROPOFOL;  Surgeon: [name], MD;  Location: [place];  Service: Endoscopy;  Laterality: Left;  . ENDARTERECTOMY FEMORAL Right 09/14/2016   Procedure: ENDARTERECTOMY FEMORAL RIGHT;  Surgeon: [name], MD;  Location: [facility] OR;  Service: Vascular;  Laterality: Right;  . ENDOVENOUS ABLATION SAPHENOUS VEIN W/ LASER  07-24-2012   right greater saphenous vein by [name], M.D.   . ENDOVENOUS ABLATION SAPHENOUS VEIN W/ LASER  10-16-2012   left greater saphenous vein  by Dr. [name]  . ENDOVENOUS ABLATION SAPHENOUS VEIN W/ LASER Right 09-01-2015   endovenous laser ablation right greater saphenous vein by [name] MD  . ESOPHAGOGASTRODUODENOSCOPY N/A 05/07/2016   Procedure: ESOPHAGOGASTRODUODENOSCOPY (EGD);  Surgeon: [name], MD;  Location[facility] ENDOSCOPY;  Service: Endoscopy;  Laterality: N/A;  . ESOPHAGOGASTRODUODENOSCOPY N/A 11/23/2020   Procedure: ESOPHAGOGASTRODUODENOSCOPY (EGD);  Surgeon: [name], MD;  Location: [facility];  Service: Endoscopy;  Laterality: N/A;  . ESOPHAGOGASTRODUODENOSCOPY (EGD) WITH PROPOFOL Left 10/07/2015   Procedure: ESOPHAGOGASTRODUODENOSCOPY (EGD) WITH PROPOFOL;  Surgeon: [name], MD;  Location: [place];  Service: Endoscopy;  Laterality: Left;  . GIVENS CAPSULE STUDY N/A 07/31/2016   Procedure: GIVENS CAPSULE STUDY;  Surgeon: [name], MD;  Location: [facility] ENDOSCOPY;  Service: Endoscopy;  Laterality: N/A;  . GIVENS CAPSULE STUDY N/A 11/23/2020   Procedure: GIVENS CAPSULE STUDY;  Surgeon: [name], MD;  Location: [facility] ENDOSCOPY;  Service: Endoscopy;  Laterality: N/A;  . IR IVC FILTER PLMT / S&I Burke Keels GUID/MOD SED  11/22/2020  . LOWER  EXTREMITY ANGIOGRAPHY N/A 10/25/2020   Procedure: LOWER EXTREMITY ANGIOGRAPHY - Left;  Surgeon: Serafina Mitchell, MD;  Location: [place] [facility];  Service: Cardiovascular;  Laterality: N/A;  . melonoma removes Right    behind right knee  . OPEN REDUCTION INTERNAL FIXATION (ORIF) DISTAL RADIAL FRACTURE Left 11/24/2019   Procedure: OPEN REDUCTION INTERNAL FIXATION (ORIF) DISTAL RADIAL FRACTURE;  Surgeon: Altamese Leonard, MD;  Location: [place];  Service: Orthopedics;  Laterality: Left;  . PATCH ANGIOPLASTY Right 09/14/2016   Procedure: PATCH ANGIOPLASTY RIGHT FEMORAL ARTERY USING HEMASHIELD PLATINUM FINESSE PATCH;  Surgeon: [name], MD;  Location: [place];  Service: Vascular;  Laterality: Right;  . PERIPHERAL VASCULAR BALLOON ANGIOPLASTY Right 10/04/2020   Procedure: PERIPHERAL VASCULAR BALLOON ANGIOPLASTY;  Surgeon: Serafina Mitchell, MD;  Location: [place] [facility];  Service: Cardiovascular;  Laterality: Right;  SFA/PT/AT/PTtrunk  . PERIPHERAL VASCULAR BALLOON ANGIOPLASTY Left 10/25/2020   Procedure: PERIPHERAL VASCULAR BALLOON ANGIOPLASTY;  Surgeon: Serafina Mitchell, MD;  Location: [place] [facility];  Service: Cardiovascular;  Laterality: Left;  anterior tibial and tibioperoneal trunk  . PERIPHERAL VASCULAR CATHETERIZATION N/A 09/07/2016   Procedure: Abdominal Aortogram w/Lower Extremity;  Surgeon: [name], MD;  Location: [place] [facility];  Service: Cardiovascular;  Laterality: N/A;  . RIGHT HEART CATH N/A 04/19/2017   Procedure: Right Heart Cath;  Surgeon: [name], MD;  Location: [place] [facility];  Service: Cardiovascular;  Laterality: N/A;  . RIGHT HEART CATHETERIZATION N/A 11/14/2012   Procedure: RIGHT HEART CATH;  Surgeon: [name], MD;  Location: [facility] CATH LAB;  Service:  Cardiovascular;  Laterality: N/A;  . rt little toe removal  10/1998  . stent left thigh  3/11  . TENDON TRANSFER Right 10/24/2017   Procedure: transfer extensor indecus propius/extensor  digitorum commomus index to middle ring and small;  Surgeon: Daryll Brod, MD;  Location: [place];  Service: Orthopedics;  Laterality: Right;  . TONSILLECTOMY    . TONSILLECTOMY AND ADENOIDECTOMY    . TUNNELED VENOUS CATHETER PLACEMENT  01/2013  . ULNAR HEAD EXCISION Right 10/24/2017   Procedure: darrach right ulna;  Surgeon: Daryll Brod, MD;  Location: [place];  Service: Orthopedics;  Laterality: Right;  . VEIN LIGATION AND STRIPPING  05/16/2012   Procedure: VEIN LIGATION AND STRIPPING;  Surgeon: [name], MD;  Location: [facility] OR;  Service: Vascular;  Laterality: Right;  Irrigation and Debridement right lower leg, ligation of saphenous vein.    Family History: Family History  Problem Relation Age of Onset  . Lupus Father   . Lymphoma Father   . Hyperlipidemia Mother   . Cancer Daughter        sarcoma  . Colon cancer Other     Social History: Social History   Socioeconomic History  . Marital status: Married    Spouse name: Not on file  . Number of children: 1  . Years of education: Not on file  . Highest education level: Not on file  Occupational History  . Occupation: homemaker  Tobacco Use  . Smoking status: Never Smoker  . Smokeless tobacco: Never Used  Vaping Use  . Vaping Use: Never used  Substance and Sexual Activity  . Alcohol use: Yes    Alcohol/week: 7.0 standard drinks    Types: 3 Standard drinks or equivalent, 4 Glasses of wine per week  . Drug use: No  . Sexual activity: Not Currently  Other Topics Concern  . Not on file  Social History Narrative  . Not on file   Social Determinants of Health   Financial Resource Strain: Not on file  Food Insecurity: Not on file  Transportation Needs: Not on file  Physical Activity: Not on file  Stress: Not on file  Social Connections: Not on file    Allergies:  Allergies  Allergen Reactions  . Levofloxacin Other (See Comments)    FLU LIKE SYMPTOM   . Sulfa Antibiotics Rash and Other (See Comments)    Face  peeled, Severe rash with significant peeling of face. No airway involvement per patient   . Doxycycline Hyclate Swelling    SWELLING REACTION UNSPECIFIED ; "TABLETS ONLY - CAPSULES ARE TOLERATED FINE"  . Penicillins Rash    Has patient had a PCN reaction causing immediate rash, facial/tongue/throat swelling, SOB or lightheadedness with hypotension: no Has patient had a PCN reaction causing severe rash involving mucus membranes or skin necrosis: no Has patient had a PCN reaction that required hospitalization: no Has patient had a PCN reaction occurring within the last 10 years: {no If all of the above answers are "NO", then may proceed with Cephalosporin use.    Objective:    Vital Signs:   Temp:  [98.2 F (36.8 C)] 98.2 F (36.8 C) (02/08 0155) Pulse Rate:  [70-117] 77 (02/08 1200) Resp:  [16-23] 23 (02/08 1200) BP: (133-167)/(54-100) 147/59 (02/08 1200) SpO2:  [74 %-100 %] 100 % (02/08 1200)    Weight change: There were no vitals filed for this visit.  Intake/Output:  No intake or output data in the 24 hours ending 02/07/21 1456  Physical Exam    General:  Chronically ill appearing. No resp difficulty HEENT: normal Neck: supple. JVP . Carotids 2+ bilat; no bruits. No lymphadenopathy or thyromegaly appreciated. Cor: PMI nondisplaced. Regular rate & rhythm. No rubs, gallops or murmurs. Lungs: clear Abdomen: soft, nontender, nondistended. No hepatosplenomegaly. No bruits or masses. Good bowel sounds. Extremities: no cyanosis, clubbing, rash, chronic non healing RLE wound Neuro: alert & orientedx3, cranial nerves grossly intact. moves all 4 extremities w/o difficulty. Affect pleasant   Telemetry   NSR 90s   EKG    Ectopic rhythm, ? WAP    Labs   Basic Metabolic Panel: Recent Labs  Lab 02/05/21 1608 02/07/21 0926 02/07/21 1154  NA 134* 135 137  K 3.3* 3.6 3.6  CL 97* 98 100  CO2 25 21* 21*  GLUCOSE 112* 73 74  BUN 31* 36* 35*  CREATININE 1.23* 2.02*  1.91*  CALCIUM 8.4* 8.6* 8.1*    Liver Function Tests: Recent Labs  Lab 02/05/21 1608 02/07/21 1154  AST 18 25  ALT 11 15  ALKPHOS 71 70  BILITOT 0.2* 0.8  PROT 6.7 5.9*  ALBUMIN 2.7* 2.4*   Recent Labs  Lab 02/07/21 1154  LIPASE 113*   No results for input(s): AMMONIA in the last 168 hours.  CBC: Recent Labs  Lab 02/05/21 1608 02/07/21 0926 02/07/21 1154  WBC 11.1* 10.3 10.3  NEUTROABS  --  8.1* 8.2*  HGB 9.2* 9.4* 9.0*  HCT 28.8* 30.2* 30.4*  MCV 98.0 98.7 101.0*  PLT 291 267 275    Cardiac Enzymes: No results for input(s): CKTOTAL, CKMB, CKMBINDEX, TROPONINI in the last 168 hours.  BNP: BNP (last 3 results) No results for input(s): BNP in the last 8760 hours.  ProBNP (last 3 results) Recent Labs    11/21/20 1439  PROBNP 526.0*     CBG: No results for input(s): GLUCAP in the last 168 hours.  Coagulation Studies: No results for input(s): LABPROT, INR in the last 72 hours.   Imaging   MR BRAIN WO CONTRAST  Result Date: 02/07/2021 CLINICAL DATA:  Weakness.  Mental status change. EXAM: MRI HEAD WITHOUT CONTRAST TECHNIQUE: Multiplanar, multiecho pulse sequences of the brain and surrounding structures were obtained without intravenous contrast. COMPARISON:  CT February 05, 2021. FINDINGS: Motion limited exam. Brain: No acute infarction, acute hemorrhage, hydrocephalus, extra-axial collection or mass lesion. Moderate diffuse cerebral atrophy with ex vacuo ventricular dilation. Moderate patchy T2/FLAIR hyperintensities within the white matter and pons, likely the sequela of chronic microvascular ischemic disease. 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 Remote right sellar bell ir lacunar infarct. Surrounding gliosis. Vascular: Major arterial flow voids are maintained at the skull base. Skull and upper cervical spine: Normal marrow signal. Sinuses/Orbits: Sinuses are largely clear.  Unremarkable orbits. Other: Left mastoid effusion. IMPRESSION: 1.  No evidence of acute intracranial abnormality on this motion limited exam. 2. 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 3. Moderate chronic microvascular ischemic disease and generalized cerebral atrophy. 4. Left mastoid effusion. Electronically Signed   By: Margaretha Sheffield MD   On: 02/07/2021 10:45   DG Chest Port 1 View  Result Date: 02/07/2021 CLINICAL DATA:  Weakness.  Central catheter placement EXAM: PORTABLE CHEST 1 VIEW COMPARISON:  November 21, 2020 FINDINGS: Central catheter tip is at the cavoatrial junction. No pneumothorax. There is interstitial thickening without frank edema or consolidation. Heart is mildly enlarged with pulmonary vascularity normal. No adenopathy. There is aortic atherosclerosis. No  bone lesions. IMPRESSION: Central catheter tip at cavoatrial junction. No pneumothorax. Central interstitial thickening, likely indicative of a degree of chronic bronchitis. No edema or airspace opacity. Mild cardiomegaly. Aortic Atherosclerosis (ICD10-I70.0). Electronically Signed   By: Lowella Grip III M.D.   On: 02/07/2021 09:25      Medications:     Current Medications: . heparin  5,000 Units Subcutaneous Q8H  . mupirocin ointment   Topical BID     Infusions: . sodium chloride 100 mL/hr at 02/07/21 1410     Assessment/Plan    1. AKI - likely prerenal in the setting of diarrhea and recent reduced PO intake - SCr 2.0 on admit, baseline 1.2 - improving w/ IVFs   2. 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 Unable to tolerate IV epoprostenol, Tyvaso or selexipag.She remains on Revatio and macitentan.  - Stable NYHA IIImacitentan 10 and revatio 60 tid  - RHC 4/18 as above -has now failed Selexipag as well as uptitration of her other PAH meds.  - Echo 12/18. Mild RV strain. Unable to estimate RVSP due to lack of significant TR  - Echo 01/16/2019 LVEF 60-65%, PA peak pressure 58 mm Hg. RV looks normal (much improved)  - Echo 3/21: LVEF  60-65% RV mildly dilated but function normal   - resume home PH regimen, macitentan 10 and revatio 60 tid  - torsemide and spiro on hold w/ AKI - monitor volume status closely w/ IVFs - resume diuretics once AKI resolves  - Continue home oxygen.   3. Chronic Non-Healing Rt LE Wound  - PAD and scleroderma  - Followed closely by the wound center, ID and plastic surgery.  - She has been on cefepime since 01/24/2021. Plan is for debridement of devitalized tissue followed by application of wound matrix after completing 2 weeks of cefepime. Scheduled 2/10 w/ plastic surgery. She is high risk under general anesthesia w/ underlying pulmonary HTN    4.  Scleroderma: - Advanced. She is followed byDr. Amil Amen.    5. Interstitial Lung Disease:  -Secondary toscleroderma.  - On Chronic Home O2  - Followed by Dr. Jerral Bonito chest CT reviewed and is stable   6. HTN  - mildly elevated - home spiro and torsemide currently on hold for AKI - resume meds once AKI resolves    Length of Stay: 0  Lyda Jester, PA-C  02/07/2021, 2:56 PM  Advanced Heart Failure Team Pager [phone] (M-F; 7a - 4p)  Please contact [facility] for night-coverage after hours (4p -7a ) and weekends on [email or web address]  Patient seen and examined with the above-signed Advanced Practice Provider and/or Housestaff. I personally reviewed laboratory data, imaging studies and relevant notes. I independently examined the patient and formulated the important aspects of the plan. I have edited the note to reflect any of my changes or salient points. I have personally discussed the plan with the patient and/or family.  70 y/o woman with scleroderma with severe PAH, ILD and PAD. She is s/p multiple amputations and auto-amputations. She has struggled with wounds for some time.   PAH has been relatively stable on combination therapy though she has refused repeat RHC and been followed by echo. Previously was non prostanoid  therapy but she wanted to stop this.   She is now struggling with non-healing wounds on her RLE and is pending I&D + wound matrix with Plastics. Admitted due to n/v/d/aki related to her abx, She is responding well to IVF.   General:  Very weak appearing. No resp  difficulty HEENT: normal Neck: supple. no JVD. Carotids 2+ bilat; no bruits. No lymphadenopathy or thryomegaly appreciated. Cor: PMI nondisplaced. Regular rate & rhythm. 2/6 TR Lungs: markedly decreased throguhout Abdomen: soft, nontender, nondistended. No hepatosplenomegaly. No bruits or masses. Good bowel sounds. Extremities: no cyanosis, clubbing, rash, edema multiple finger amputation. RLE wrapped and erythematous Neuro: alert & orientedx3, cranial nerves grossly intact. moves all 4 extremities w/o difficulty. Affect pleasant  Agree with IVF. Hold all diuretics. Continue PAH meds as BP tolerates. She would be at high risk for complications from GA and I will d/w anesthesia about possible use of a block with conscious sedation.   Glori Bickers, MD  5:00 PM

## 2021-02-08 ENCOUNTER — Inpatient Hospital Stay (HOSPITAL_COMMUNITY): Payer: Medicare Other

## 2021-02-08 ENCOUNTER — Other Ambulatory Visit: Payer: Self-pay

## 2021-02-08 DIAGNOSIS — I4891 Unspecified atrial fibrillation: Secondary | ICD-10-CM

## 2021-02-08 DIAGNOSIS — N179 Acute kidney failure, unspecified: Secondary | ICD-10-CM | POA: Diagnosis not present

## 2021-02-08 DIAGNOSIS — N1831 Chronic kidney disease, stage 3a: Secondary | ICD-10-CM | POA: Diagnosis not present

## 2021-02-08 LAB — BASIC METABOLIC PANEL
Anion gap: 18 — ABNORMAL HIGH (ref 5–15)
BUN: 34 mg/dL — ABNORMAL HIGH (ref 8–23)
CO2: 15 mmol/L — ABNORMAL LOW (ref 22–32)
Calcium: 7.1 mg/dL — ABNORMAL LOW (ref 8.9–10.3)
Chloride: 104 mmol/L (ref 98–111)
Creatinine, Ser: 1.71 mg/dL — ABNORMAL HIGH (ref 0.44–1.00)
GFR, Estimated: 32 mL/min — ABNORMAL LOW (ref >60)
Glucose, Bld: 62 mg/dL — ABNORMAL LOW (ref 70–99)
Potassium: 3.6 mmol/L (ref 3.5–5.1)
Sodium: 137 mmol/L (ref 135–145)

## 2021-02-08 LAB — TROPONIN I (HIGH SENSITIVITY)
Troponin I (High Sensitivity): 84 ng/L — ABNORMAL HIGH (ref <18)
Troponin I (High Sensitivity): 441 ng/L (ref <18)

## 2021-02-08 LAB — CBC WITH DIFFERENTIAL/PLATELET
Abs Immature Granulocytes: 0.17 10*3/uL — ABNORMAL HIGH (ref 0.00–0.07)
Basophils Absolute: 0 10*3/uL (ref 0.0–0.1)
Basophils Relative: 0 %
Eosinophils Absolute: 0 10*3/uL (ref 0.0–0.5)
Eosinophils Relative: 0 %
HCT: 29.7 % — ABNORMAL LOW (ref 36.0–46.0)
Hemoglobin: 8.7 g/dL — ABNORMAL LOW (ref 12.0–15.0)
Immature Granulocytes: 2 %
Lymphocytes Relative: 12 %
Lymphs Abs: 1.2 10*3/uL (ref 0.7–4.0)
MCH: 29.8 pg (ref 26.0–34.0)
MCHC: 29.3 g/dL — ABNORMAL LOW (ref 30.0–36.0)
MCV: 101.7 fL — ABNORMAL HIGH (ref 80.0–100.0)
Monocytes Absolute: 1 10*3/uL (ref 0.1–1.0)
Monocytes Relative: 10 %
Neutro Abs: 8.2 10*3/uL — ABNORMAL HIGH (ref 1.7–7.7)
Neutrophils Relative %: 76 %
Platelets: 275 10*3/uL (ref 150–400)
RBC: 2.92 MIL/uL — ABNORMAL LOW (ref 3.87–5.11)
RDW: 18.6 % — ABNORMAL HIGH (ref 11.5–15.5)
WBC: 10.7 10*3/uL — ABNORMAL HIGH (ref 4.0–10.5)
nRBC: 0 % (ref 0.0–0.2)

## 2021-02-08 LAB — VITAMIN B12: Vitamin B-12: 1429 pg/mL — ABNORMAL HIGH (ref 180–914)

## 2021-02-08 LAB — ECHOCARDIOGRAM COMPLETE
S' Lateral: 2.3 cm
Weight: 1961.21 oz

## 2021-02-08 LAB — GLUCOSE, CAPILLARY: Glucose-Capillary: 92 mg/dL (ref 70–99)

## 2021-02-08 LAB — HEPATIC FUNCTION PANEL
ALT: 23 U/L (ref 0–44)
AST: 43 U/L — ABNORMAL HIGH (ref 15–41)
Albumin: 2 g/dL — ABNORMAL LOW (ref 3.5–5.0)
Alkaline Phosphatase: 92 U/L (ref 38–126)
Bilirubin, Direct: <0.1 mg/dL (ref 0.0–0.2)
Total Bilirubin: 0.8 mg/dL (ref 0.3–1.2)
Total Protein: 5.3 g/dL — ABNORMAL LOW (ref 6.5–8.1)

## 2021-02-08 LAB — MAGNESIUM: Magnesium: 1.5 mg/dL — ABNORMAL LOW (ref 1.7–2.4)

## 2021-02-08 LAB — FOLATE: Folate: 58.1 ng/mL (ref >5.9)

## 2021-02-08 LAB — HEPARIN LEVEL (UNFRACTIONATED)
Heparin Unfractionated: 0.18 IU/mL — ABNORMAL LOW (ref 0.30–0.70)
Heparin Unfractionated: 0.21 IU/mL — ABNORMAL LOW (ref 0.30–0.70)

## 2021-02-08 LAB — TSH: TSH: 1.46 u[IU]/mL (ref 0.350–4.500)

## 2021-02-08 MED ORDER — BIOTIN 5000 MCG PO CAPS: 5000.0000 ug | ORAL_CAPSULE | Freq: Every day | ORAL | Status: DC

## 2021-02-08 MED ORDER — DILTIAZEM LOAD VIA INFUSION
10.0000 mg | Freq: Once | INTRAVENOUS | Status: AC
  Administered 2021-02-08: 10 mg via INTRAVENOUS
  Filled 2021-02-08: qty 10

## 2021-02-08 MED ORDER — POLYSACCHARIDE IRON COMPLEX 150 MG PO CAPS
150.0000 mg | ORAL_CAPSULE | Freq: Two times a day (BID) | ORAL | Status: DC
  Administered 2021-02-08 – 2021-02-10 (×5): 150 mg via ORAL
  Filled 2021-02-08 (×5): qty 1

## 2021-02-08 MED ORDER — FOLIC ACID 1 MG PO TABS
1.0000 mg | ORAL_TABLET | Freq: Every day | ORAL | Status: DC
  Administered 2021-02-08 – 2021-02-10 (×3): 1 mg via ORAL
  Filled 2021-02-08 (×3): qty 1

## 2021-02-08 MED ORDER — DILTIAZEM LOAD VIA INFUSION: 10.0000 mg | Freq: Once | INTRAVENOUS | Status: DC

## 2021-02-08 MED ORDER — SODIUM CHLORIDE 0.9% FLUSH
10.0000 mL | INTRAVENOUS | Status: DC | PRN
  Administered 2021-02-10 (×2): 10 mL

## 2021-02-08 MED ORDER — MACITENTAN 10 MG PO TABS
10.0000 mg | ORAL_TABLET | Freq: Every day | ORAL | Status: DC
  Administered 2021-02-08 – 2021-02-10 (×3): 10 mg via ORAL
  Filled 2021-02-08 (×3): qty 1

## 2021-02-08 MED ORDER — HEPARIN (PORCINE) 25000 UT/250ML-% IV SOLN
1150.0000 [IU]/h | INTRAVENOUS | Status: DC
  Administered 2021-02-08: 750 [IU]/h via INTRAVENOUS
  Administered 2021-02-09: 1050 [IU]/h via INTRAVENOUS
  Filled 2021-02-08 (×2): qty 250

## 2021-02-08 MED ORDER — VITAMIN D 25 MCG (1000 UNIT) PO TABS
2000.0000 [IU] | ORAL_TABLET | Freq: Every day | ORAL | Status: DC
  Administered 2021-02-08 – 2021-02-09 (×2): 2000 [IU] via ORAL
  Filled 2021-02-08 (×2): qty 2

## 2021-02-08 MED ORDER — MAGNESIUM SULFATE 2 GM/50ML IV SOLN
2.0000 g | Freq: Once | INTRAVENOUS | Status: AC
  Administered 2021-02-08: 2 g via INTRAVENOUS
  Filled 2021-02-08: qty 50

## 2021-02-08 MED ORDER — PREDNISONE 5 MG PO TABS
5.0000 mg | ORAL_TABLET | Freq: Every day | ORAL | Status: DC
  Administered 2021-02-08 – 2021-02-10 (×3): 5 mg via ORAL
  Filled 2021-02-08 (×3): qty 1

## 2021-02-08 MED ORDER — SODIUM BICARBONATE 8.4 % IV SOLN
INTRAVENOUS | Status: DC
  Filled 2021-02-08: qty 850

## 2021-02-08 MED ORDER — ROSUVASTATIN CALCIUM 5 MG PO TABS
5.0000 mg | ORAL_TABLET | ORAL | Status: DC
  Administered 2021-02-08 – 2021-02-10 (×2): 5 mg via ORAL
  Filled 2021-02-08 (×2): qty 1

## 2021-02-08 MED ORDER — LEVOTHYROXINE SODIUM 50 MCG PO TABS
50.0000 ug | ORAL_TABLET | Freq: Every day | ORAL | Status: DC
  Administered 2021-02-09 – 2021-02-10 (×2): 50 ug via ORAL
  Filled 2021-02-08 (×2): qty 1

## 2021-02-08 MED ORDER — SILDENAFIL CITRATE 20 MG PO TABS
60.0000 mg | ORAL_TABLET | Freq: Three times a day (TID) | ORAL | Status: DC
  Administered 2021-02-08 – 2021-02-10 (×6): 60 mg via ORAL
  Filled 2021-02-08 (×7): qty 3

## 2021-02-08 MED ORDER — ENSURE ENLIVE PO LIQD
237.0000 mL | Freq: Two times a day (BID) | ORAL | Status: DC
  Administered 2021-02-08 – 2021-02-10 (×2): 237 mL via ORAL

## 2021-02-08 MED ORDER — GLUCOSE 40 % PO GEL
1.0000 | ORAL | Status: AC
  Administered 2021-02-08: 37.5 g via ORAL
  Filled 2021-02-08: qty 1

## 2021-02-08 MED ORDER — SODIUM CHLORIDE 0.9% FLUSH
10.0000 mL | Freq: Two times a day (BID) | INTRAVENOUS | Status: DC
  Administered 2021-02-08 – 2021-02-09 (×4): 10 mL

## 2021-02-08 MED ORDER — HEPARIN BOLUS VIA INFUSION
3000.0000 [IU] | Freq: Once | INTRAVENOUS | Status: AC
  Administered 2021-02-08: 3000 [IU] via INTRAVENOUS
  Filled 2021-02-08: qty 3000

## 2021-02-08 MED ORDER — CHLORHEXIDINE GLUCONATE CLOTH 2 % EX PADS
6.0000 | MEDICATED_PAD | Freq: Every day | CUTANEOUS | Status: DC
  Administered 2021-02-08 – 2021-02-10 (×3): 6 via TOPICAL

## 2021-02-08 MED ORDER — DILTIAZEM HCL-DEXTROSE 125-5 MG/125ML-% IV SOLN (PREMIX)
5.0000 mg/h | INTRAVENOUS | Status: DC
  Administered 2021-02-08 – 2021-02-10 (×3): 5 mg/h via INTRAVENOUS
  Filled 2021-02-08 (×3): qty 125

## 2021-02-08 MED ORDER — CITALOPRAM HYDROBROMIDE 20 MG PO TABS
20.0000 mg | ORAL_TABLET | Freq: Every day | ORAL | Status: DC
  Administered 2021-02-08 – 2021-02-09 (×2): 20 mg via ORAL
  Filled 2021-02-08 (×2): qty 1

## 2021-02-08 MED ORDER — SODIUM BICARBONATE 8.4 % IV SOLN
50.0000 meq | Freq: Once | INTRAVENOUS | Status: AC
  Administered 2021-02-08: 50 meq via INTRAVENOUS
  Filled 2021-02-08: qty 50

## 2021-02-08 MED ORDER — MAGNESIUM SULFATE 2 GM/50ML IV SOLN
2.0000 g | Freq: Once | INTRAVENOUS | Status: AC
  Administered 2021-02-08: 2 g via INTRAVENOUS
  Filled 2021-02-08 (×2): qty 50

## 2021-02-08 MED ORDER — DILTIAZEM HCL-DEXTROSE 125-5 MG/125ML-% IV SOLN (PREMIX): 5.0000 mg/h | INTRAVENOUS | Status: DC

## 2021-02-08 NOTE — Progress Notes (Signed)
[place] for Heparin Indication: atrial fibrillation  Allergies  Allergen Reactions  . Levofloxacin Other (See Comments)    FLU LIKE SYMPTOM   . Sulfa Antibiotics Rash and Other (See Comments)    Face peeled, Severe rash with significant peeling of face. No airway involvement per patient   . Doxycycline Hyclate Swelling    SWELLING REACTION UNSPECIFIED ; "TABLETS ONLY - CAPSULES ARE TOLERATED FINE"  . Penicillins Rash    Has patient had a PCN reaction causing immediate rash, facial/tongue/throat swelling, SOB or lightheadedness with hypotension: no Has patient had a PCN reaction causing severe rash involving mucus membranes or skin necrosis: no Has patient had a PCN reaction that required hospitalization: no Has patient had a PCN reaction occurring within the last 10 years: {no If all of the above answers are "NO", then may proceed with Cephalosporin use.    Patient Measurements: Weight: 55.6 kg (122 lb 9.2 oz)   Vital Signs: Temp: 98 F (36.7 C) (02/09 0731) Temp Source: Oral (02/09 0731) BP: 141/75 (02/09 0600) Pulse Rate: 92 (02/09 0600)  Labs: Recent Labs    02/07/21 0926 02/07/21 1154 02/08/21 0448 02/08/21 1006 02/08/21 1404  HGB 9.4* 9.0* 8.7*  --   --   HCT 30.2* 30.4* 29.7*  --   --   PLT 267 275 275  --   --   HEPARINUNFRC  --   --   --   --  0.18*  CREATININE 2.02* 1.91* 1.71*  --   --   TROPONINIHS  --   --  84* 441*  --     Estimated Creatinine Clearance: 26.5 mL/min (A) (by C-G formula based on SCr of 1.71 mg/dL (H)).   Medical History: Past Medical History:  Diagnosis Date  . Anemia   . Arthritis   . DOE (dyspnea on exertion)   . Epistaxis   . History of chicken pox   . Hypertension   . Hypothyroidism   . Leg ulcer ([place])   . Melanoma ([place]) 1999  . 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[place])   . Non Hodgkin's lymphoma ([place])    Treated with chemo 2010, felt to be cured.  Marland Kitchen PAH (pulmonary artery hypertension)  ([place])   . Peripheral arterial occlusive disease ([place])   . Pneumonia 2008  . Pulmonary hypertension ([place])   . Requires supplemental oxygen    2 liters-3liters when moving  . 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 acetaminophen (TYLENOL) 650 MG CR tablet Take 1,300 mg by mouth every 8 (eight) hours as needed for pain.   unk at prn  . Biotin 5000 MCG CAPS Take 5,000 mcg by mouth daily.   02/05/2021 at Unknown time  . Calcium Carbonate-Vit D-Min (CALCIUM 1200 PO) Take 1 tablet by mouth at bedtime.   02/05/2021 at Unknown time  . Cholecalciferol (VITAMIN D) 2000 units CAPS Take 2,000 Units by mouth at bedtime.   02/05/2021 at Unknown time  . citalopram (CELEXA) 40 MG tablet TAKE 1 TABLET BY MOUTH EVERY DAY (Patient taking differently: Take 40 mg by mouth at bedtime.) 30 tablet 3 02/05/2021  . clopidogrel (PLAVIX) 75 MG tablet TAKE 1 TABLET BY MOUTH EVERY DAY (Patient taking differently: Take 75 mg by mouth daily.) 90 tablet 3 02/05/2021 at 2200  . cycloSPORINE (RESTASIS) 0.05 % ophthalmic emulsion Place 2 drops into both eyes 2 (two) times daily.  02/05/2021  . FERREX 150 150 MG capsule TAKE 1 CAPSULE BY MOUTH TWICE A DAY (Patient taking differently: Take 150 mg by mouth 2 (two) times daily.) 180 capsule 1 02/05/2021  . fish oil-omega-3 fatty acids 1000 MG capsule Take 1,000 mg by mouth 2 (two) times daily.   02/05/2021  . folic acid (FOLVITE) 1 MG tablet TAKE 1 TABLET BY MOUTH EVERY DAY (Patient taking differently: Take 1 mg by mouth daily.) 90 tablet 1 02/05/2021  . levothyroxine (SYNTHROID) 50 MCG tablet TAKE 1 TABLET BY MOUTH DAILY BEFORE BREAKFAST (Patient taking differently: Take 50 mcg by mouth daily before breakfast.) 90 tablet 1 02/04/2021  . loperamide (IMODIUM) 2 MG capsule Take 2-4 mg by mouth 4 (four) times daily as needed for diarrhea or loose stools.   unk at prn  . MAGNESIUM ASPARTATE PO Take 150 mg by mouth in the  morning and at bedtime.   02/05/2021  . Multiple Vitamin (MULTIVITAMIN WITH MINERALS) TABS tablet Take 1 tablet by mouth daily at 12 noon.   02/04/2021  . OPSUMIT 10 MG tablet Take 1 tablet (13m) by mouth daily. (Patient taking differently: Take 10 mg by mouth daily.) 30 tablet 11 02/05/2021  . pantoprazole (PROTONIX) 40 MG tablet Take 1 tablet (40 mg total) by mouth 2 (two) times daily. 180 tablet 3 02/05/2021  . potassium chloride 20 MEQ/15ML (10%) SOLN TAKE 2 TEASPOONFUL (DILUTED IN WATER) ONCE DAILY (Patient taking differently: Take 20 mEq by mouth daily.) 473 mL 11 02/05/2021  . predniSONE (DELTASONE) 5 MG tablet Take 5 mg by mouth daily.   02/05/2021  . rosuvastatin (CRESTOR) 5 MG tablet Take 1 tablet every Monday, Wednesday, and Friday. (Patient taking differently: Take 5 mg by mouth every Monday, Wednesday, and Friday.) 45 tablet 3 02/03/2021  . sildenafil (REVATIO) 20 MG tablet Take 3 tablets (60 mg total) by mouth 3 (three) times daily. 810 tablet 3 02/05/2021  . silver sulfADIAZINE (SILVADENE) 1 % cream Apply 1 application topically daily.   02/05/2021  . spironolactone (ALDACTONE) 25 MG tablet TAKE 1 TABLET BY MOUTH EVERY DAY (Patient taking differently: Take 25 mg by mouth daily.) 90 tablet 0 02/05/2021  . torsemide (DEMADEX) 20 MG tablet Take 20-40 mg by mouth See admin instructions. Take 2 tablets (40 mg) by mouth in the morning & take 1 tablet (20 mg) by mouth at night.   02/05/2021  . traMADol (ULTRAM) 50 MG tablet Take 50 mg by mouth every 6 (six) hours as needed for moderate pain or severe pain.   unk at prn  . valACYclovir (VALTREX) 500 MG tablet Take 1 tablet (500 mg total) by mouth 2 (two) times daily. (Patient taking differently: Take 500 mg by mouth 2 (two) times daily as needed (lip sore).) 10 tablet 0 unk at prn  . vitamin B-12 (CYANOCOBALAMIN) 1000 MCG tablet Take 1,000 mcg by mouth daily.   02/05/2021  . HYDROcodone-acetaminophen (NORCO) 5-325 MG tablet One tab by mouth with food sparingly up to 3  times daily as needed for leg wound pain (Patient not taking: No sig reported) 15 tablet 0 Not Taking at Unknown time  . OXYGEN Inhale 2-3 L into the lungs continuous.    n/a    Assessment: 70y.o. F presents with. To begin heparin for afib. No AC PTA. Hgb 9 (8-9 over past month).   Initial heparin level subtherapeutic at 0.18 after 3000 unit bolus and initiating heparin gtt at 750 units/hr. Hgb stable, plt wnl. No bleeding per  RN.   Goal of Therapy:  Heparin level 0.3-0.7 units/ml Monitor platelets by anticoagulation protocol: Yes  Plan:  Increase heparin gtt to 900 units/hr Will f/u heparin level in 8 hours Daily heparin level and [place], PharmD PGY1 Pharmacy Resident 02/08/2021 2:51 PM  Please check [email or web address] for unit-specific pharmacy phone numbers.

## 2021-02-08 NOTE — Progress Notes (Signed)
Echocardiogram 2D Echocardiogram has been performed.  Oneal Deputy Maejor Erven 02/08/2021, 11:38 AM

## 2021-02-08 NOTE — Progress Notes (Signed)
Chart check   Patient was getting u/s echo when I attempt to visit  aki improving Afebrile Wound care team involved   A/p Chronic open wound LE s/p cefepime SSI course; no improvement pulm htn with cor pulmonale CREST  -continues off abx -continue wound care

## 2021-02-08 NOTE — Progress Notes (Signed)
Pt had a-fib with RVR doctor notified and orders followed.

## 2021-02-08 NOTE — Consult Note (Signed)
[facility] Nurse Consult Note: Patient receiving care in [facility]. Reason for Consult: LE wounds Wound type: PAD, PVD, scleroderma, chronic of many years, non-healing--worsening per notes from [facility] Dr. Dellia Nims. I spoke at length with Dr. Ardeen Jourdain yesterday while the patient was in the [facility] ED.  He has entered wound care orders for the LE wounds.  I see Dr. [name] has her scheduled for debridement of the RLE tomorrow.  No new needs identified. Val Riles, RN, MSN, CWOCN, CNS-BC, pager [phone]

## 2021-02-08 NOTE — Progress Notes (Signed)
PROGRESS NOTE    [name] [name]  ZDG:[id number] DOB: 1951-10-04 DOA: 02/06/2021 PCP: [name], MD   Brief Narrative: 70 year old with past medical history significant for pulmonary hypertension followed by Dr. 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  Of note patient was recently evaluated by plastic surgery Dr. [name] on 1/21//2022 with tentative plan for debridement of devitalized tissue of her  right lower extremity and application of a wound matrix.  Patient has been on cefepime since 01/24/2021.  Recommendation per infectious disease at that time was to continue on 2-week course, the day of admission was completion of 2 weeks.  Patient presented to the ER with concern of ongoing diarrhea and poor appetite for approximately the past week.  He states that she appears very weak and deconditioned and has been unable to stand, and her speech has also been mumbled, so he brought her to the ER for further evaluation.  Patient was found to have AKI with a creatinine of 2.0, usually baseline 1.3.  Patient was a started on IV fluids.  Husband noticed improvement of patient is paged in the ER.  Patient is chronically on 2 to 3 L of oxygen at baseline.  Heart failure team, ID and plastic surgery has been consulted. She  develop A. fib with RVR overnight.   Assessment & Plan:   Principal Problem:   Acute renal failure superimposed on stage 3a chronic kidney disease (HCC) Active Problems:   Chronic respiratory failure with hypoxia (HCC)   Hypothyroidism   Macrocytic anemia   Thrombocytosis   PAH (pulmonary artery hypertension) (HCC)   Chronic diastolic CHF (congestive heart failure) (HCC)   Non-healing wound of right lower extremity   Physical deconditioning   1-Acute on Chronic Stage III a Renal Failure: -CKD stage III a, cr baseline 1.3.  -Cr peak to 2.  -Improving with IV  fluids.  -Likely pre-renal secondary to hypovolemia from diarrhea.   2-Metabolic acidosis;  -could be related to renal failure.  -Change IV fluids to bicarb Gtt.  -Follow labs   3-A fib RVR;  -Overnight develops A fib RVR.  -Started on Cardizem Gtt.  -Cardiology following.  -Elevation troponin, cardiology will be inform of level by nurse.  -on Heparin Gtt.  4-nonhealing with off right lower extremity -ID consulted, they recommend to stop IV antibiotics. -Plastic surgery was informed of patient admission.- they  will need to discuss with cardiology if patient is able to undergo the procedure.  5-Chronic diastolic heart failure, PAH: Continue to monitor volume status Continue to hold Lasix and spironolactone Appreciate  Dr. [name] assistance Resume Revatio and Opsumit.   6- deconditioning: Patient will need PT OT  7-Chronic respiratory failure with hypoxia: On chronic 2 to 3 L of oxygen at home. COVID-19 negative Resume prednisone.  8-Macrocytic  anemia: Hb slightly decrease to 8.7 -resume iron.   9-Hypothyroidism; resume synthroid./   10-Hypoglycemia; added D 5 to IV fluids.  11-Hypomagnesemia; replete IV  Repeat level in am.    Estimated body mass index is 18.64 kg/m as calculated from the following:   Height as of 02/05/21: _0  (1.727 m).   Weight as of this encounter: 55.6 kg.   DVT prophylaxis: heparin gtt Code Status: Full code Family Communication: Husband who was at bedside Disposition Plan:  Status is: Inpatient  Remains inpatient appropriate because:Hemodynamically unstable and IV treatments appropriate due to intensity of illness or inability to take PO  Dispo: The patient is from: Home              Anticipated d/c is to: To be determined              Anticipated d/c date is: 3 days              Patient currently is not medically stable to d/c.   Difficult to place patient No        Consultants:   Cardiology  ID  Procedures:    none  Antimicrobials:    Subjective: She is alert, denies chest pain.  She is feeling little bit better.  Denies diarrhea  Objective: Vitals:   02/08/21 0431 02/08/21 0539 02/08/21 0600 02/08/21 0731  BP: (!) 115/58 117/61 (!) 141/75   Pulse: (!) 157  92   Resp: (!) 23  (!) 25   Temp: 97.7 F (36.5 C)   98 F (36.7 C)  TempSrc:    Oral  SpO2:   100%   Weight:        Intake/Output Summary (Last 24 hours) at 02/08/2021 1043 Last data filed at 02/08/2021 0753 Gross per 24 hour  Intake 929.28 ml  Output 600 ml  Net 329.28 ml   Filed Weights   02/08/21 0122  Weight: 55.6 kg    Examination:  General exam: Appears calm and comfortable  Respiratory system: no wheezing.  Cardiovascular system: S1 & S2 heard, IRR Gastrointestinal system: Abdomen is nondistended, soft and nontender. No organomegaly or masses felt. Normal bowel sounds heard. Central nervous system: Alert and oriented. No focal neurological deficits. Extremities: LE with dressing.    Data Reviewed: I have personally reviewed following labs and imaging studies  CBC: Recent Labs  Lab 02/05/21 1608 02/07/21 0926 02/07/21 1154 02/08/21 0448  WBC 11.1* 10.3 10.3 10.7*  NEUTROABS  --  8.1* 8.2* 8.2*  HGB 9.2* 9.4* 9.0* 8.7*  HCT 28.8* 30.2* 30.4* 29.7*  MCV 98.0 98.7 101.0* 101.7*  PLT 291 267 275 366   Basic Metabolic Panel: Recent Labs  Lab 02/05/21 1608 02/07/21 0926 02/07/21 1154 02/08/21 0448  NA 134* 135 137 137  K 3.3* 3.6 3.6 3.6  CL 97* 98 100 104  CO2 25 21* 21* 15*  GLUCOSE 112* 73 74 62*  BUN 31* 36* 35* 34*  CREATININE 1.23* 2.02* 1.91* 1.71*  CALCIUM 8.4* 8.6* 8.1* 7.1*  MG  --   --   --  1.5*   GFR: Estimated Creatinine Clearance: 26.5 mL/min (A) (by C-G formula based on SCr of 1.71 mg/dL (H)). Liver Function Tests: Recent Labs  Lab 02/05/21 1608 02/07/21 1154  AST 18 25  ALT 11 15  ALKPHOS 71 70  BILITOT 0.2* 0.8  PROT 6.7 5.9*  ALBUMIN 2.7* 2.4*   Recent Labs   Lab 02/07/21 1154  LIPASE 113*   No results for input(s): AMMONIA in the last 168 hours. Coagulation Profile: No results for input(s): INR, PROTIME in the last 168 hours. Cardiac Enzymes: No results for input(s): CKTOTAL, CKMB, CKMBINDEX, TROPONINI in the last 168 hours. BNP (last 3 results) Recent Labs    11/21/20 1439  PROBNP 526.0*   HbA1C: No results for input(s): HGBA1C in the last 72 hours. CBG: No results for input(s): GLUCAP in the last 168 hours. Lipid Profile: No results for input(s): CHOL, HDL, LDLCALC, TRIG, CHOLHDL, LDLDIRECT in the last 72 hours. Thyroid Function Tests: Recent Labs    02/08/21 0530  TSH 1.460   Anemia  Panel: Recent Labs    02/08/21 0530  VITAMINB12 1,429*  FOLATE 58.1   Sepsis Labs: No results for input(s): PROCALCITON, LATICACIDVEN in the last 168 hours.  Recent Results (from the past 240 hour(s))  SARS CORONAVIRUS 2 (TAT 6-24 HRS) Nasopharyngeal Nasopharyngeal Swab     Status: None   Collection Time: 02/03/21 10:05 AM   Specimen: Nasopharyngeal Swab  Result Value Ref Range Status   SARS Coronavirus 2 NEGATIVE NEGATIVE Final    Comment: (NOTE) SARS-CoV-2 target nucleic acids are NOT DETECTED.  The SARS-CoV-2 RNA is generally detectable in upper and lower respiratory specimens during the acute phase of infection. Negative results do not preclude SARS-CoV-2 infection, do not rule out co-infections with other pathogens, and should not be used as the sole basis for treatment or other patient management decisions. Negative results must be combined with clinical observations, patient history, and epidemiological information. The expected result is Negative.  Fact Sheet for Patients: SugarRoll.be  Fact Sheet for Healthcare Providers: https://www.woods-mathews.com/  This test is not yet approved or cleared by the [place] FDA and  has been authorized for detection and/or diagnosis of  SARS-CoV-2 by FDA under an Emergency Use Authorization (EUA). This EUA will remain  in effect (meaning this test can be used) for the duration of the COVID-19 declaration under Se ction 564(b)(1) of the Act, 21 U.S.C. section 360bbb-3(b)(1), unless the authorization is terminated or revoked sooner.  Performed at [facility], [place] [address], [place], [place] [address]   Urine culture     Status: Abnormal   Collection Time: 02/05/21  4:08 PM   Specimen: Urine, Clean Catch  Result Value Ref Range Status   Specimen Description   Final    URINE, CLEAN CATCH Performed at [facility], [address] [place], [place] [address]    Special Requests   Final    NONE Performed at [facility], [address] [place], [place] [address]    Culture (A)  Final    <10,000 COLONIES/mL INSIGNIFICANT GROWTH Performed at [facility], [place] [address] [place], [place] [address]    Report Status 02/07/2021 FINAL  Final  SARS CORONAVIRUS 2 (TAT 6-24 HRS) Nasopharyngeal     Status: None   Collection Time: 02/07/21 12:01 PM   Specimen: Nasopharyngeal  Result Value Ref Range Status   SARS Coronavirus 2 NEGATIVE NEGATIVE Final    Comment: (NOTE) SARS-CoV-2 target nucleic acids are NOT DETECTED.  The SARS-CoV-2 RNA is generally detectable in upper and lower respiratory specimens during the acute phase of infection. Negative results do not preclude SARS-CoV-2 infection, do not rule out co-infections with other pathogens, and should not be used as the sole basis for treatment or other patient management decisions. Negative results must be combined with clinical observations, patient history, and epidemiological information. The expected result is Negative.  Fact Sheet for Patients: SugarRoll.be  Fact Sheet for Healthcare Providers: https://www.woods-mathews.com/  This test is not yet approved or cleared by the [place] FDA and  has been authorized for detection and/or diagnosis of SARS-CoV-2 by FDA under an Emergency Use Authorization (EUA). This EUA will remain  in effect (meaning this test can be used) for the duration of the COVID-19 declaration under Se ction 564(b)(1) of the Act, 21 U.S.C. section 360bbb-3(b)(1), unless the authorization is terminated or revoked sooner.  Performed at [facility], [place] [address] [place], [place] [address]  Radiology Studies: MR BRAIN WO CONTRAST  Result Date: 02/07/2021 CLINICAL DATA:  Weakness.  Mental status change. EXAM: MRI HEAD WITHOUT CONTRAST TECHNIQUE: Multiplanar, multiecho pulse sequences of the brain and surrounding structures were obtained without intravenous contrast. COMPARISON:  CT February 05, 2021. FINDINGS: Motion limited exam. Brain: No acute infarction, acute hemorrhage, hydrocephalus, extra-axial collection or mass lesion. Moderate diffuse cerebral atrophy with ex vacuo ventricular dilation. Moderate patchy T2/FLAIR hyperintensities within the white matter and pons, likely the sequela of chronic microvascular ischemic disease. 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 Remote right sellar bell ir lacunar infarct. Surrounding gliosis. Vascular: Major arterial flow voids are maintained at the skull base. Skull and upper cervical spine: Normal marrow signal. Sinuses/Orbits: Sinuses are largely clear.  Unremarkable orbits. Other: Left mastoid effusion. IMPRESSION: 1. No evidence of acute intracranial abnormality on this motion limited exam. 2. 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 3. Moderate chronic microvascular ischemic disease and generalized cerebral atrophy. 4. Left mastoid effusion. Electronically Signed   By: Margaretha Sheffield MD   On: 02/07/2021 10:45   DG CHEST PORT 1 VIEW  Result Date: 02/08/2021 CLINICAL DATA:  Tachycardia. EXAM: PORTABLE CHEST 1 VIEW COMPARISON:  02/07/2021  FINDINGS: 0517 hours. The cardio pericardial silhouette is enlarged. Interstitial markings are diffusely coarsened with chronic features. No dense focal airspace consolidation. No pneumothorax or pleural effusion. Right PICC line again noted. Telemetry leads overlie the chest. IMPRESSION: Stable.  Cardiomegaly with chronic interstitial lung disease. Electronically Signed   By: Misty Stanley M.D.   On: 02/08/2021 05:56   DG Chest Port 1 View  Result Date: 02/07/2021 CLINICAL DATA:  Weakness.  Central catheter placement EXAM: PORTABLE CHEST 1 VIEW COMPARISON:  November 21, 2020 FINDINGS: Central catheter tip is at the cavoatrial junction. No pneumothorax. There is interstitial thickening without frank edema or consolidation. Heart is mildly enlarged with pulmonary vascularity normal. No adenopathy. There is aortic atherosclerosis. No bone lesions. IMPRESSION: Central catheter tip at cavoatrial junction. No pneumothorax. Central interstitial thickening, likely indicative of a degree of chronic bronchitis. No edema or airspace opacity. Mild cardiomegaly. Aortic Atherosclerosis (ICD10-I70.0). Electronically Signed   By: Lowella Grip III M.D.   On: 02/07/2021 09:25        Scheduled Meds: . Chlorhexidine Gluconate Cloth  6 each Topical Daily  . macitentan  10 mg Oral Daily  . mupirocin ointment   Topical BID  . predniSONE  5 mg Oral Q breakfast  . sildenafil  60 mg Oral TID  . sodium chloride flush  10-40 mL Intracatheter Q12H   Continuous Infusions: . diltiazem (CARDIZEM) infusion 5 mg/hr (02/08/21 0535)  . heparin 750 Units/hr (02/08/21 0557)  . magnesium sulfate bolus IVPB    . sodium bicarbonate (isotonic) 150 mEq in D5W 1000 mL infusion       LOS: 1 day    Time spent: 35 minutes     Julus Kelley A Taishaun Levels, MD [facility]   If 7PM-7AM, please contact night-coverage [email or web address]  02/08/2021, 10:43 AM

## 2021-02-08 NOTE — Progress Notes (Addendum)
Floor coverage overnight event  Notified by RN that patient is tachycardic with heart rate in the 150s to 160s.  Blood pressure stable.  Per RN, patient is resting and has no complaints.  Stable on her home oxygen.  No respiratory distress.  EKG showing A. fib with RVR, RBBB, diffuse ST/T wave changes.  -Cardizem bolus and infusion -CHA2DS2VASc 5, start IV heparin -Echocardiogram ordered -Stat chest x-ray -Stat troponin.  A.m. labs currently in process to check CBC, BMP, magnesium, and TSH levels. -Monitor closely on progressive care unit  Addendum/update: Magnesium and bicarb levels low on labs, supplementation ordered.  Blood glucose 62, dextrose ordered.  Troponin, TSH, and chest x-ray pending.

## 2021-02-08 NOTE — Progress Notes (Signed)
Inpatient Diabetes Program Recommendations  AACE/ADA: New Consensus Statement on Inpatient Glycemic Control (2015)  Target Ranges:  Prepandial:   less than 140 mg/dL      Peak postprandial:   less than 180 mg/dL (1-2 hours)      Critically ill patients:  140 - 180 mg/dL     Review of Glycemic Control Results for [name] [name], [name] [name] (MRN [id number]) as of 02/08/2021 11:56  Ref. Range 02/08/2021 04:48  Glucose Latest Ref Range: 70 - 99 mg/dL 62 (L)   Diabetes history: No hx noted Outpatient Diabetes medications: none Current orders for Inpatient glycemic control: none  Inpatient Diabetes Program Recommendations:    Consider adding CBGs TID if glucose continues to require hypoglycemia interventions.   Thanks, Bronson Curb, MSN, RNC-OB Diabetes Coordinator [phone] (8a-5p)

## 2021-02-08 NOTE — ED Notes (Signed)
Difficulty getting pulse oximetry

## 2021-02-08 NOTE — Progress Notes (Signed)
Pharmacy Consult for Pulmonary Hypertension Treatment   Indication - Continuation of prior to admission medication   Patient is 69 y.o.  with history of PAH on chronic Macitentan (Opsumit) PTA and will be continued while hospitalized.   Continuing this medication order as an inpatient requires that monitoring parameters per REMS requirements must be met.  1. Chronic therapy is under the supervision of Bensimhon who is enrolled in the REMS program and is being notified of continuation of therapy. A staff message in EPIC has been sent notifying the certified prescriber.  2. Per patient report has previously been educated on Hepatotoxicity . On admission pregnancy risk has been assessed and no monitoring required. 3. Hepatic function has been evaluated. AST/ALT appropriate to continue medication at this time.  Hepatic Function Latest Ref Rng & Units 02/07/2021 02/05/2021 01/26/2021  Total Protein 6.5 - 8.1 g/dL 5.9(L) 6.7 8.3(H)  Albumin 3.5 - 5.0 g/dL 2.4(L) 2.7(L) 3.2(L)  AST 15 - 41 U/L 25 18 16  ALT 0 - 44 U/L 15 11 9  Alk Phosphatase 38 - 126 U/L 70 71 103  Total Bilirubin 0.3 - 1.2 mg/dL 0.8 0.2(L) 0.3  Bilirubin, Direct 0.0 - 0.2 mg/dL - - -    If any question arise or pregnancy is identified during hospitalization, contact for bosentan and macitentan: [phone]; ambrisentan: [phone].  Thank for you allowing us to participate in the care of this patient.    PharmD., BCPS Clinical Pharmacist 02/08/2021 11:45 AM   

## 2021-02-08 NOTE — Progress Notes (Addendum)
Advanced Heart Failure Rounding Note  PCP-Cardiologist: Glori Bickers, MD   Subjective:    SCr improved w/ IVFs, 2.02>>1.91>>1.71  Off NaCl infusion. Now on Sodium bicarb gtt at 75cc/hr. CO2 15   Went into afib w/ RVR overnight in the 160s. Placed on cardizem gtt. Back in NSR HR in the 80s.   HS trop up to 441.   K 3.9 Mg low at 1.5. IV mg ordered.   WBC 10.7. AF. On abx for chronic LEE wound. Scheduled for wound debridement tomorrow.   Echo pending.     Objective:   Weight Range: 55.6 kg Body mass index is 18.64 kg/m.   Vital Signs:   Temp:  [97.7 F (36.5 C)-98.3 F (36.8 C)] 98 F (36.7 C) (02/09 0731) Pulse Rate:  [69-157] 92 (02/09 0600) Resp:  [18-25] 25 (02/09 0600) BP: (115-156)/(58-104) 141/75 (02/09 0600) SpO2:  [82 %-100 %] 100 % (02/09 0600) Weight:  [55.6 kg] 55.6 kg (02/09 0122)    Weight change: Filed Weights   02/08/21 0122  Weight: 55.6 kg    Intake/Output:   Intake/Output Summary (Last 24 hours) at 02/08/2021 1050 Last data filed at 02/08/2021 0753 Gross per 24 hour  Intake 929.28 ml  Output 600 ml  Net 329.28 ml      Physical Exam    General:  Thin chronically ill appearing WF. No resp difficulty HEENT: Normal Neck: Supple. JVP elevated . Carotids 2+ bilat; no bruits. No lymphadenopathy or thyromegaly appreciated. Cor: PMI nondisplaced. Regular rate & rhythm. No rubs, gallops or murmurs. Lungs: Clear Abdomen: Soft, nontender, nondistended. No hepatosplenomegaly. No bruits or masses. Good bowel sounds. Extremities: No cyanosis, clubbing, rash, edema Neuro: Alert & orientedx3, cranial nerves grossly intact. moves all 4 extremities w/o difficulty. Affect pleasant   Telemetry   NSR 80s   EKG    Early AM EKGs showed rapid Afib 148 bpm   Labs    CBC Recent Labs    02/07/21 1154 02/08/21 0448  WBC 10.3 10.7*  NEUTROABS 8.2* 8.2*  HGB 9.0* 8.7*  HCT 30.4* 29.7*  MCV 101.0* 101.7*  PLT 275 737   Basic Metabolic  Panel Recent Labs    02/07/21 1154 02/08/21 0448  NA 137 137  K 3.6 3.6  CL 100 104  CO2 21* 15*  GLUCOSE 74 62*  BUN 35* 34*  CREATININE 1.91* 1.71*  CALCIUM 8.1* 7.1*  MG  --  1.5*   Liver Function Tests Recent Labs    02/05/21 1608 02/07/21 1154  AST 18 25  ALT 11 15  ALKPHOS 71 70  BILITOT 0.2* 0.8  PROT 6.7 5.9*  ALBUMIN 2.7* 2.4*   Recent Labs    02/07/21 1154  LIPASE 113*   Cardiac Enzymes No results for input(s): CKTOTAL, CKMB, CKMBINDEX, TROPONINI in the last 72 hours.  BNP: BNP (last 3 results) No results for input(s): BNP in the last 8760 hours.  ProBNP (last 3 results) Recent Labs    11/21/20 1439  PROBNP 526.0*     D-Dimer No results for input(s): DDIMER in the last 72 hours. Hemoglobin A1C No results for input(s): HGBA1C in the last 72 hours. Fasting Lipid Panel No results for input(s): CHOL, HDL, LDLCALC, TRIG, CHOLHDL, LDLDIRECT in the last 72 hours. Thyroid Function Tests Recent Labs    02/08/21 0530  TSH 1.460    Other results:   Imaging    DG CHEST PORT 1 VIEW  Result Date: 02/08/2021 CLINICAL DATA:  Tachycardia.  EXAM: PORTABLE CHEST 1 VIEW COMPARISON:  02/07/2021 FINDINGS: 0517 hours. The cardio pericardial silhouette is enlarged. Interstitial markings are diffusely coarsened with chronic features. No dense focal airspace consolidation. No pneumothorax or pleural effusion. Right PICC line again noted. Telemetry leads overlie the chest. IMPRESSION: Stable.  Cardiomegaly with chronic interstitial lung disease. Electronically Signed   By: Misty Stanley M.D.   On: 02/08/2021 05:56      Medications:     Scheduled Medications: . Chlorhexidine Gluconate Cloth  6 each Topical Daily  . feeding supplement  237 mL Oral BID BM  . macitentan  10 mg Oral Daily  . mupirocin ointment   Topical BID  . predniSONE  5 mg Oral Q breakfast  . sildenafil  60 mg Oral TID  . sodium chloride flush  10-40 mL Intracatheter Q12H      Infusions: . diltiazem (CARDIZEM) infusion 5 mg/hr (02/08/21 0535)  . heparin 750 Units/hr (02/08/21 0557)  . magnesium sulfate bolus IVPB    . sodium bicarbonate (isotonic) 150 mEq in D5W 1000 mL infusion       PRN Medications:  acetaminophen **OR** acetaminophen, LORazepam, oxyCODONE, sodium chloride flush    Assessment/Plan   1. AKI - likely prerenal in the setting of diarrhea and recent reduced PO intake - SCr 2.0 on admit, baseline 1.2 - improving w/ IVFs, down 2.02>>1.91>>1.71   2. 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 Unable to tolerate IV epoprostenol, Tyvaso or selexipag.She remains on Revatio and macitentan.  - Stable NYHA IIImacitentan 10 and revatio 60 tid  - RHC 4/18 as above -has now failed Selexipag as well as uptitration of her other PAH meds.  - Echo 12/18. Mild RV strain. Unable to estimate RVSP due to lack of significant TR  - Echo 01/16/2019 LVEF 60-65%, PA peak pressure 58 mm Hg. RV looks normal (much improved)  - Echo 3/21: LVEF 60-65% RV mildly dilated but function normal   - resume home PH regimen, macitentan 10 and revatio 60 tid  - torsemide and spiro on hold w/ AKI -volume trending up w/ IVFs. Will discontinue hydration  - resume diuretics once AKI resolves  - Continue home oxygen. - Echo pending to reassess RVSP.   3. Chronic Non-Healing Rt LE Wound  - PAD and scleroderma  - Followed closely by the wound center, ID and plastic surgery.  - She has been on cefepime since 01/24/2021. Plan is for debridement of devitalized tissue followed by application of wound matrix after completing 2 weeks of cefepime.  - Scheduled 2/10 w/ plastic surgery. She would be high risk under general anesthesia w/ underlying pulmonary HTN. Dr. [name] to d/w anesthesia about possible use of a block with conscious sedation.  4.  Scleroderma: - Advanced. She is followed byDr. Amil Amen.    5. Interstitial Lung Disease:  -Secondary toscleroderma.   - On Chronic Home O2  - Followed by Dr. Jerral Bonito chest CT reviewed and is stable   6. HTN  - mildly elevated - home spiro and torsemide currently on hold for AKI - resume meds once AKI resolves - monitor closely  7. Afib w/ RVR - Afib w/ RVR overnight, V-rates 140s - no prior documented history  - converted to NSR on Cardizem gtt  - aggressively replete Mg (1.5). Supp IV Mg ordered  - started on heparin gtt. Continue perioperatively  - Given 1st documented occurrence, in setting of AKI/dehydation and chronic wound infection, may consider outpatient monitor post d/c to assess for recurrence to  determine need for long term oral anticoagulation. Will defer to Dr. [name]. Of note, her husband reports a h/o bleeding previously w/ Xarelto.   8. Elevated HS Troponin - troponin bump to 441, likely demand ischemia from rapid afib - denies CP  - suspect she has underlying CAD given h/o severe PAD    Length of Stay: 1  Brittainy Simmons, PA-C  02/08/2021, 10:50 AM  Advanced Heart Failure Team Pager [phone] (M-F; 7a - 4p)  Please contact [facility] for night-coverage after hours (4p -7a ) and weekends on [email or web address]   Patient seen and examined with the above-signed Advanced Practice Provider and/or Housestaff. I personally reviewed laboratory data, imaging studies and relevant notes. I independently examined the patient and formulated the important aspects of the plan. I have edited the note to reflect any of my changes or salient points. I have personally discussed the plan with the patient and/or family.  Had AF with RVR to 160 last night. Started on cardizem gtt. Broke at [place]. Hstrop up to 441. Denies CP or SOB. Very weak. On bicarb gtt for bicarb of 15. Mag 1.5 -> supplemented  General:  Chronically ill appearing. No resp difficulty HEENT: normal Neck: supple. JVP to jaw Carotids 2+ bilat; no bruits. No lymphadenopathy or thryomegaly appreciated. Cor: PMI nondisplaced.  irregular rate & rhythm. 2/6 TR. Lungs: decreased throughout  Abdomen: soft, nontender, nondistended. No hepatosplenomegaly. No bruits or masses. Good bowel sounds. Extremities: no cyanosis, clubbing, rash, 2+ edema  + RLE dressing Neuro: alert & orientedx3, cranial nerves grossly intact. moves all 4 extremities w/o difficulty. Affect pleasant  She is very tenuous and likely close to end-stage. She had AF with RVR last night and has now had demand NSTEMI. I suspect she has diffuse CAD but not candidate for cath. Currently on heparin and diltiazem. Failed Xarelto recently (had DVT) due to bleeding and does not want AC. She is extremely high-risk for surgery tomorrow. I spoke with anesthesia team and said I would not be comfortable with GA. They will plan a block and conscious sedation   We will need to address Code Status prior to d/c. On D/c will continue Plavix and not be on AC.   Glori Bickers, MD  5:14 PM

## 2021-02-08 NOTE — Progress Notes (Signed)
ANTICOAGULATION CONSULT NOTE - Initial Consult  Pharmacy Consult for Heparin Indication: atrial fibrillation  Allergies  Allergen Reactions  . Levofloxacin Other (See Comments)    FLU LIKE SYMPTOM   . Sulfa Antibiotics Rash and Other (See Comments)    Face peeled, Severe rash with significant peeling of face. No airway involvement per patient   . Doxycycline Hyclate Swelling    SWELLING REACTION UNSPECIFIED ; "TABLETS ONLY - CAPSULES ARE TOLERATED FINE"  . Penicillins Rash    Has patient had a PCN reaction causing immediate rash, facial/tongue/throat swelling, SOB or lightheadedness with hypotension: no Has patient had a PCN reaction causing severe rash involving mucus membranes or skin necrosis: no Has patient had a PCN reaction that required hospitalization: no Has patient had a PCN reaction occurring within the last 10 years: {no If all of the above answers are "NO", then may proceed with Cephalosporin use.    Patient Measurements: Weight: 55.6 kg (122 lb 9.2 oz)   Vital Signs: Temp: 98.2 F (36.8 C) (02/09 0121) Temp Source: Oral (02/09 0121) BP: 115/58 (02/09 0431) Pulse Rate: 157 (02/09 0431)  Labs: Recent Labs    02/05/21 1608 02/07/21 0926 02/07/21 1154  HGB 9.2* 9.4* 9.0*  HCT 28.8* 30.2* 30.4*  PLT 291 267 275  CREATININE 1.23* 2.02* 1.91*    Estimated Creatinine Clearance: 23.7 mL/min (A) (by C-G formula based on SCr of 1.91 mg/dL (H)).   Medical History: Past Medical History:  Diagnosis Date  . Anemia   . Arthritis   . DOE (dyspnea on exertion)   . Epistaxis   . History of chicken pox   . Hypertension   . Hypothyroidism   . Leg ulcer ([place])   . Melanoma ([place]) 1999  . 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 Non Hodgkin's lymphoma ([place])    Treated with chemo 2010, felt to be cured.  Marland Kitchen PAH (pulmonary artery hypertension) ([place])   . Peripheral arterial occlusive disease ([place])   . Pneumonia 2008  . Pulmonary hypertension ([place])   . Requires  supplemental oxygen    2 liters-3liters when moving  . 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 acetaminophen (TYLENOL) 650 MG CR tablet Take 1,300 mg by mouth every 8 (eight) hours as needed for pain.   unk at prn  . Biotin 5000 MCG CAPS Take 5,000 mcg by mouth daily.   02/05/2021 at Unknown time  . Calcium Carbonate-Vit D-Min (CALCIUM 1200 PO) Take 1 tablet by mouth at bedtime.   02/05/2021 at Unknown time  . ceFEPIme (MAXIPIME) 2 g injection 2 g every 12 (twelve) hours.   02/05/2021  . Cholecalciferol (VITAMIN D) 2000 units CAPS Take 2,000 Units by mouth at bedtime.   02/05/2021 at Unknown time  . citalopram (CELEXA) 40 MG tablet TAKE 1 TABLET BY MOUTH EVERY DAY (Patient taking differently: Take 40 mg by mouth at bedtime.) 30 tablet 3 02/05/2021  . clopidogrel (PLAVIX) 75 MG tablet TAKE 1 TABLET BY MOUTH EVERY DAY (Patient taking differently: Take 75 mg by mouth daily.) 90 tablet 3 02/05/2021 at 2200  . cycloSPORINE (RESTASIS) 0.05 % ophthalmic emulsion Place 2 drops into both eyes 2 (two) times daily.   02/05/2021  . FERREX 150 150 MG capsule TAKE 1 CAPSULE BY MOUTH TWICE A DAY (Patient taking differently: Take 150 mg by mouth 2 (two) times daily.) 180 capsule 1 02/05/2021  . fish oil-omega-3 fatty  acids 1000 MG capsule Take 1,000 mg by mouth 2 (two) times daily.   02/05/2021  . folic acid (FOLVITE) 1 MG tablet TAKE 1 TABLET BY MOUTH EVERY DAY (Patient taking differently: Take 1 mg by mouth daily.) 90 tablet 1 02/05/2021  . levothyroxine (SYNTHROID) 50 MCG tablet TAKE 1 TABLET BY MOUTH DAILY BEFORE BREAKFAST (Patient taking differently: Take 50 mcg by mouth daily before breakfast.) 90 tablet 1 02/04/2021  . loperamide (IMODIUM) 2 MG capsule Take 2-4 mg by mouth 4 (four) times daily as needed for diarrhea or loose stools.   unk at prn  . MAGNESIUM ASPARTATE PO Take 150 mg by mouth in the morning and at bedtime.   02/05/2021  .  Multiple Vitamin (MULTIVITAMIN WITH MINERALS) TABS tablet Take 1 tablet by mouth daily at 12 noon.   02/04/2021  . OPSUMIT 10 MG tablet Take 1 tablet (75m) by mouth daily. (Patient taking differently: Take 10 mg by mouth daily.) 30 tablet 11 02/05/2021  . pantoprazole (PROTONIX) 40 MG tablet Take 1 tablet (40 mg total) by mouth 2 (two) times daily. 180 tablet 3 02/05/2021  . potassium chloride 20 MEQ/15ML (10%) SOLN TAKE 2 TEASPOONFUL (DILUTED IN WATER) ONCE DAILY (Patient taking differently: Take 20 mEq by mouth daily.) 473 mL 11 02/05/2021  . predniSONE (DELTASONE) 5 MG tablet Take 5 mg by mouth daily.   02/05/2021  . rosuvastatin (CRESTOR) 5 MG tablet Take 1 tablet every Monday, Wednesday, and Friday. (Patient taking differently: Take 5 mg by mouth every Monday, Wednesday, and Friday.) 45 tablet 3 02/03/2021  . sildenafil (REVATIO) 20 MG tablet Take 3 tablets (60 mg total) by mouth 3 (three) times daily. 810 tablet 3 02/05/2021  . silver sulfADIAZINE (SILVADENE) 1 % cream Apply 1 application topically daily.   02/05/2021  . spironolactone (ALDACTONE) 25 MG tablet TAKE 1 TABLET BY MOUTH EVERY DAY (Patient taking differently: Take 25 mg by mouth daily.) 90 tablet 0 02/05/2021  . torsemide (DEMADEX) 20 MG tablet Take 20-40 mg by mouth See admin instructions. Take 2 tablets (40 mg) by mouth in the morning & take 1 tablet (20 mg) by mouth at night.   02/05/2021  . traMADol (ULTRAM) 50 MG tablet Take 50 mg by mouth every 6 (six) hours as needed for moderate pain or severe pain.   unk at prn  . valACYclovir (VALTREX) 500 MG tablet Take 1 tablet (500 mg total) by mouth 2 (two) times daily. (Patient taking differently: Take 500 mg by mouth 2 (two) times daily as needed (lip sore).) 10 tablet 0 unk at prn  . vitamin B-12 (CYANOCOBALAMIN) 1000 MCG tablet Take 1,000 mcg by mouth daily.   02/05/2021  . HYDROcodone-acetaminophen (NORCO) 5-325 MG tablet One tab by mouth with food sparingly up to 3 times daily as needed for leg wound  pain (Patient not taking: No sig reported) 15 tablet 0 Not Taking at Unknown time  . OXYGEN Inhale 2-3 L into the lungs continuous.    n/a    Assessment: 70y.o. F presents with. To begin heparin for afib. No AC PTA. Hgb 9 (8-9 over past month).   Goal of Therapy:  Heparin level 0.3-0.7 units/ml Monitor platelets by anticoagulation protocol: Yes   Plan:  D/c SQ heparin Heparin IV bolus 3000 units Heparin gtt at 750 units/hr Will f/u heparin level in 8 hours Daily heparin level and CBC  CSherlon Handing PharmD, BCPS Please see amion for complete clinical pharmacist phone list 02/08/2021,5:01 AM

## 2021-02-09 ENCOUNTER — Inpatient Hospital Stay: Payer: Medicare Other

## 2021-02-09 ENCOUNTER — Inpatient Hospital Stay (HOSPITAL_COMMUNITY): Payer: Medicare Other | Admitting: Anesthesiology

## 2021-02-09 ENCOUNTER — Encounter (HOSPITAL_COMMUNITY)
Admission: EM | Disposition: A | Discharge status: Home Health | Payer: Self-pay | Source: Home / Self Care | Attending: Internal Medicine

## 2021-02-09 ENCOUNTER — Encounter (HOSPITAL_COMMUNITY): Payer: Self-pay | Admitting: Internal Medicine

## 2021-02-09 DIAGNOSIS — T148XXA Other injury of unspecified body region, initial encounter: Secondary | ICD-10-CM

## 2021-02-09 DIAGNOSIS — N1831 Chronic kidney disease, stage 3a: Secondary | ICD-10-CM | POA: Diagnosis not present

## 2021-02-09 DIAGNOSIS — R5383 Other fatigue: Secondary | ICD-10-CM

## 2021-02-09 DIAGNOSIS — I5032 Chronic diastolic (congestive) heart failure: Secondary | ICD-10-CM | POA: Diagnosis not present

## 2021-02-09 DIAGNOSIS — I21A1 Myocardial infarction type 2: Secondary | ICD-10-CM | POA: Diagnosis not present

## 2021-02-09 DIAGNOSIS — I130 Hypertensive heart and chronic kidney disease with heart failure and stage 1 through stage 4 chronic kidney disease, or unspecified chronic kidney disease: Secondary | ICD-10-CM | POA: Diagnosis not present

## 2021-02-09 DIAGNOSIS — I480 Paroxysmal atrial fibrillation: Secondary | ICD-10-CM

## 2021-02-09 DIAGNOSIS — S81801A Unspecified open wound, right lower leg, initial encounter: Secondary | ICD-10-CM

## 2021-02-09 DIAGNOSIS — I2721 Secondary pulmonary arterial hypertension: Secondary | ICD-10-CM | POA: Diagnosis not present

## 2021-02-09 DIAGNOSIS — N179 Acute kidney failure, unspecified: Secondary | ICD-10-CM | POA: Diagnosis not present

## 2021-02-09 DIAGNOSIS — M341 CR(E)ST syndrome: Secondary | ICD-10-CM | POA: Diagnosis not present

## 2021-02-09 HISTORY — PX: I & D EXTREMITY: SHX5045

## 2021-02-09 HISTORY — PX: APPLICATION OF A-CELL OF EXTREMITY: SHX6303

## 2021-02-09 LAB — CBC WITH DIFFERENTIAL/PLATELET
Abs Immature Granulocytes: 0.12 10*3/uL — ABNORMAL HIGH (ref 0.00–0.07)
Abs Immature Granulocytes: 0.15 10*3/uL — ABNORMAL HIGH (ref 0.00–0.07)
Basophils Absolute: 0 10*3/uL (ref 0.0–0.1)
Basophils Absolute: 0 10*3/uL (ref 0.0–0.1)
Basophils Relative: 0 %
Basophils Relative: 0 %
Eosinophils Absolute: 0 10*3/uL (ref 0.0–0.5)
Eosinophils Absolute: 0 10*3/uL (ref 0.0–0.5)
Eosinophils Relative: 0 %
Eosinophils Relative: 0 %
HCT: 24.8 % — ABNORMAL LOW (ref 36.0–46.0)
HCT: 30.2 % — ABNORMAL LOW (ref 36.0–46.0)
Hemoglobin: 7.9 g/dL — ABNORMAL LOW (ref 12.0–15.0)
Hemoglobin: 9.2 g/dL — ABNORMAL LOW (ref 12.0–15.0)
Immature Granulocytes: 1 %
Immature Granulocytes: 2 %
Lymphocytes Relative: 9 %
Lymphocytes Relative: 5 %
Lymphs Abs: 0.7 10*3/uL (ref 0.7–4.0)
Lymphs Abs: 0.5 10*3/uL — ABNORMAL LOW (ref 0.7–4.0)
MCH: 31.3 pg (ref 26.0–34.0)
MCH: 29.9 pg (ref 26.0–34.0)
MCHC: 31.9 g/dL (ref 30.0–36.0)
MCHC: 30.5 g/dL (ref 30.0–36.0)
MCV: 98.4 fL (ref 80.0–100.0)
MCV: 98.1 fL (ref 80.0–100.0)
Monocytes Absolute: 0.8 10*3/uL (ref 0.1–1.0)
Monocytes Absolute: 0.5 10*3/uL (ref 0.1–1.0)
Monocytes Relative: 9 %
Monocytes Relative: 5 %
Neutro Abs: 6.8 10*3/uL (ref 1.7–7.7)
Neutro Abs: 8.8 10*3/uL — ABNORMAL HIGH (ref 1.7–7.7)
Neutrophils Relative %: 81 %
Neutrophils Relative %: 88 %
Platelets: 231 10*3/uL (ref 150–400)
Platelets: 239 10*3/uL (ref 150–400)
RBC: 2.52 MIL/uL — ABNORMAL LOW (ref 3.87–5.11)
RBC: 3.08 MIL/uL — ABNORMAL LOW (ref 3.87–5.11)
RDW: 18.6 % — ABNORMAL HIGH (ref 11.5–15.5)
RDW: 19.2 % — ABNORMAL HIGH (ref 11.5–15.5)
WBC: 8.5 10*3/uL (ref 4.0–10.5)
WBC: 9.9 10*3/uL (ref 4.0–10.5)
nRBC: 0 % (ref 0.0–0.2)
nRBC: 0 % (ref 0.0–0.2)

## 2021-02-09 LAB — BASIC METABOLIC PANEL
Anion gap: 12 (ref 5–15)
BUN: 30 mg/dL — ABNORMAL HIGH (ref 8–23)
CO2: 19 mmol/L — ABNORMAL LOW (ref 22–32)
Calcium: 6.9 mg/dL — ABNORMAL LOW (ref 8.9–10.3)
Chloride: 103 mmol/L (ref 98–111)
Creatinine, Ser: 1.59 mg/dL — ABNORMAL HIGH (ref 0.44–1.00)
GFR, Estimated: 35 mL/min — ABNORMAL LOW (ref >60)
Glucose, Bld: 101 mg/dL — ABNORMAL HIGH (ref 70–99)
Potassium: 3.3 mmol/L — ABNORMAL LOW (ref 3.5–5.1)
Sodium: 134 mmol/L — ABNORMAL LOW (ref 135–145)

## 2021-02-09 LAB — PREPARE RBC (CROSSMATCH)

## 2021-02-09 LAB — MAGNESIUM: Magnesium: 2.3 mg/dL (ref 1.7–2.4)

## 2021-02-09 LAB — HEPARIN LEVEL (UNFRACTIONATED): Heparin Unfractionated: 0.26 IU/mL — ABNORMAL LOW (ref 0.30–0.70)

## 2021-02-09 SURGERY — IRRIGATION AND DEBRIDEMENT EXTREMITY
Anesthesia: Monitor Anesthesia Care | Site: Leg Lower | Laterality: Right

## 2021-02-09 MED ORDER — BUPIVACAINE HCL (PF) 0.25 % IJ SOLN
INTRAMUSCULAR | Status: DC | PRN
  Administered 2021-02-09: 30 mL

## 2021-02-09 MED ORDER — LIDOCAINE 2% (20 MG/ML) 5 ML SYRINGE
INTRAMUSCULAR | Status: AC
  Filled 2021-02-09: qty 5

## 2021-02-09 MED ORDER — FENTANYL CITRATE (PF) 100 MCG/2ML IJ SOLN: 25.0000 ug | INTRAMUSCULAR | Status: DC | PRN

## 2021-02-09 MED ORDER — LIDOCAINE-EPINEPHRINE 1 %-1:100000 IJ SOLN
INTRAMUSCULAR | Status: DC | PRN
  Administered 2021-02-09: 20 mL via INTRADERMAL

## 2021-02-09 MED ORDER — POTASSIUM CHLORIDE CRYS ER 20 MEQ PO TBCR
40.0000 meq | EXTENDED_RELEASE_TABLET | Freq: Once | ORAL | Status: DC
  Filled 2021-02-09: qty 2

## 2021-02-09 MED ORDER — BUPIVACAINE-EPINEPHRINE (PF) 0.5% -1:200000 IJ SOLN
INTRAMUSCULAR | Status: DC | PRN
  Administered 2021-02-09: 15 mL via PERINEURAL
  Administered 2021-02-09: 20 mL via PERINEURAL

## 2021-02-09 MED ORDER — SODIUM CHLORIDE 0.9 % IR SOLN
Status: DC | PRN
  Administered 2021-02-09: 3000 mL

## 2021-02-09 MED ORDER — ONDANSETRON HCL 4 MG/2ML IJ SOLN
INTRAMUSCULAR | Status: AC
  Filled 2021-02-09: qty 2

## 2021-02-09 MED ORDER — SODIUM CHLORIDE 0.9% IV SOLUTION: Freq: Once | INTRAVENOUS | Status: AC

## 2021-02-09 MED ORDER — FENTANYL CITRATE (PF) 100 MCG/2ML IJ SOLN
INTRAMUSCULAR | Status: AC
  Administered 2021-02-09: 25 ug via INTRAVENOUS
  Filled 2021-02-09: qty 2

## 2021-02-09 MED ORDER — ONDANSETRON HCL 4 MG/2ML IJ SOLN: 4.0000 mg | Freq: Once | INTRAMUSCULAR | Status: DC | PRN

## 2021-02-09 MED ORDER — FENTANYL CITRATE (PF) 250 MCG/5ML IJ SOLN
INTRAMUSCULAR | Status: AC
  Filled 2021-02-09: qty 5

## 2021-02-09 MED ORDER — PROPOFOL 10 MG/ML IV BOLUS
INTRAVENOUS | Status: AC
  Filled 2021-02-09: qty 20

## 2021-02-09 MED ORDER — LACTATED RINGERS IV SOLN: INTRAVENOUS | Status: DC | PRN

## 2021-02-09 MED ORDER — ENOXAPARIN SODIUM 30 MG/0.3ML ~~LOC~~ SOLN
30.0000 mg | SUBCUTANEOUS | Status: DC
  Administered 2021-02-09: 30 mg via SUBCUTANEOUS
  Filled 2021-02-09: qty 0.3

## 2021-02-09 MED ORDER — CLINDAMYCIN PHOSPHATE 900 MG/50ML IV SOLN
900.0000 mg | INTRAVENOUS | Status: AC
  Administered 2021-02-09: 900 mg via INTRAVENOUS
  Filled 2021-02-09: qty 50

## 2021-02-09 MED ORDER — FENTANYL CITRATE (PF) 100 MCG/2ML IJ SOLN
INTRAMUSCULAR | Status: DC | PRN
  Administered 2021-02-09: 25 ug via INTRAVENOUS

## 2021-02-09 MED ORDER — 0.9 % SODIUM CHLORIDE (POUR BTL) OPTIME
TOPICAL | Status: DC | PRN
  Administered 2021-02-09: 1000 mL

## 2021-02-09 MED ORDER — FENTANYL CITRATE (PF) 100 MCG/2ML IJ SOLN: 25.0000 ug | Freq: Once | INTRAMUSCULAR | Status: AC

## 2021-02-09 MED ORDER — BUPIVACAINE HCL (PF) 0.25 % IJ SOLN
INTRAMUSCULAR | Status: AC
  Filled 2021-02-09: qty 30

## 2021-02-09 MED ORDER — POTASSIUM CHLORIDE CRYS ER 20 MEQ PO TBCR
40.0000 meq | EXTENDED_RELEASE_TABLET | Freq: Once | ORAL | Status: AC
  Administered 2021-02-09: 40 meq via ORAL
  Filled 2021-02-09: qty 2

## 2021-02-09 MED ORDER — LIDOCAINE-EPINEPHRINE 1 %-1:100000 IJ SOLN
INTRAMUSCULAR | Status: AC
  Filled 2021-02-09: qty 1

## 2021-02-09 MED ORDER — HEPARIN BOLUS VIA INFUSION
1500.0000 [IU] | Freq: Once | INTRAVENOUS | Status: AC
  Administered 2021-02-09: 1500 [IU] via INTRAVENOUS
  Filled 2021-02-09: qty 1500

## 2021-02-09 MED ORDER — ACETAMINOPHEN 325 MG PO TABS
650.0000 mg | ORAL_TABLET | Freq: Once | ORAL | Status: AC
  Administered 2021-02-09: 650 mg via ORAL
  Filled 2021-02-09: qty 2

## 2021-02-09 SURGICAL SUPPLY — 55 items
BNDG CONFORM 2 STRL LF (GAUZE/BANDAGES/DRESSINGS) IMPLANT
BNDG ELASTIC 4X5.8 VLCR STR LF (GAUZE/BANDAGES/DRESSINGS) ×2 IMPLANT
BNDG GAUZE ELAST 4 BULKY (GAUZE/BANDAGES/DRESSINGS) ×1 IMPLANT
BRUSH SCRUB EZ PLAIN DRY (MISCELLANEOUS) ×1 IMPLANT
CHLORAPREP W/TINT 26 (MISCELLANEOUS) ×1 IMPLANT
CORD BIPOLAR FORCEPS 12FT (ELECTRODE) IMPLANT
COVER SURGICAL LIGHT HANDLE (MISCELLANEOUS) ×2 IMPLANT
COVER WAND RF STERILE (DRAPES) ×1 IMPLANT
CUFF TOURN SGL QUICK 18X4 (TOURNIQUET CUFF) IMPLANT
CUFF TOURN SGL QUICK 24 (TOURNIQUET CUFF)
CUFF TRNQT CYL 24X4X16.5-23 (TOURNIQUET CUFF) IMPLANT
DRAPE INCISE IOBAN 66X45 STRL (DRAPES) IMPLANT
DRSG ADAPTIC 3X8 NADH LF (GAUZE/BANDAGES/DRESSINGS) IMPLANT
DRSG MEPITEL 4X7.2 (GAUZE/BANDAGES/DRESSINGS) ×2 IMPLANT
GAUZE SPONGE 4X4 12PLY STRL (GAUZE/BANDAGES/DRESSINGS) ×1 IMPLANT
GAUZE SPONGE 4X4 12PLY STRL LF (GAUZE/BANDAGES/DRESSINGS) ×1 IMPLANT
GAUZE XEROFORM 1X8 LF (GAUZE/BANDAGES/DRESSINGS) ×1 IMPLANT
GLOVE BIO SURGEON STRL SZ7 (GLOVE) ×2 IMPLANT
GLOVE BIO SURGEON STRL SZ8 (GLOVE) ×2 IMPLANT
GLOVE BIOGEL M STRL SZ7.5 (GLOVE) ×2 IMPLANT
GOWN STRL REUS W/ TWL LRG LVL3 (GOWN DISPOSABLE) ×2 IMPLANT
GOWN STRL REUS W/TWL LRG LVL3 (GOWN DISPOSABLE) ×6
HANDPIECE VERSAJET 45 DEG 8 (MISCELLANEOUS) ×1 IMPLANT
KIT BASIN OR (CUSTOM PROCEDURE TRAY) ×2 IMPLANT
KIT TURNOVER KIT B (KITS) ×2 IMPLANT
MANIFOLD NEPTUNE II (INSTRUMENTS) ×2 IMPLANT
NDL HYPO 25GX1X1/2 BEV (NEEDLE) IMPLANT
NEEDLE HYPO 25GX1X1/2 BEV (NEEDLE) ×2 IMPLANT
NS IRRIG 1000ML POUR BTL (IV SOLUTION) ×2 IMPLANT
PACK ORTHO EXTREMITY (CUSTOM PROCEDURE TRAY) ×2 IMPLANT
PAD ABD 7.5X8 STRL (GAUZE/BANDAGES/DRESSINGS) ×2 IMPLANT
PAD ABD 8X10 STRL (GAUZE/BANDAGES/DRESSINGS) IMPLANT
PAD ARMBOARD 7.5X6 YLW CONV (MISCELLANEOUS) ×2 IMPLANT
PAD CAST 4YDX4 CTTN HI CHSV (CAST SUPPLIES) IMPLANT
PADDING CAST COTTON 4X4 STRL (CAST SUPPLIES)
PRIMATRIX AG 8X8 (Miscellaneous) ×1 IMPLANT
SET CYSTO W/LG BORE CLAMP LF (SET/KITS/TRAYS/PACK) IMPLANT
SOL PREP POV-IOD 4OZ 10% (MISCELLANEOUS) ×4 IMPLANT
SPONGE LAP 4X18 RFD (DISPOSABLE) ×2 IMPLANT
STAPLER VISISTAT 35W (STAPLE) IMPLANT
STOCKINETTE 6  STRL (DRAPES)
STOCKINETTE 6 STRL (DRAPES) IMPLANT
SUT ETHILON 3 0 FSL (SUTURE) IMPLANT
SUT VIC AB 3-0 CT1 27 (SUTURE) ×4
SUT VIC AB 3-0 CT1 TAPERPNT 27 (SUTURE) IMPLANT
SUT VIC AB 3-0 PS2 18 (SUTURE) ×4
SUT VIC AB 3-0 PS2 18XBRD (SUTURE) IMPLANT
SUT VIC AB 4-0 PS2 18 (SUTURE) IMPLANT
SWAB CULTURE ESWAB REG 1ML (MISCELLANEOUS) IMPLANT
SYR CONTROL 10ML LL (SYRINGE) ×1 IMPLANT
TOWEL GREEN STERILE (TOWEL DISPOSABLE) ×2 IMPLANT
TOWEL GREEN STERILE FF (TOWEL DISPOSABLE) ×2 IMPLANT
TUBE CONNECTING 12X1/4 (SUCTIONS) ×2 IMPLANT
WATER STERILE IRR 1000ML POUR (IV SOLUTION) ×1 IMPLANT
YANKAUER SUCT BULB TIP NO VENT (SUCTIONS) ×2 IMPLANT

## 2021-02-09 NOTE — Progress Notes (Signed)
[facility]  Date of Admission:  02/06/2021      Abx: Outpatient cefepime at least 1 week                                                             Assessment: 70 y.o. female PMH crest, pulm htn, venous stasis, hx dvt s/p pvc filter (doac stopped 10/2020 due to gib), pvd referred by wound center for infected chronic open wound started on cefepime, admitted 2/08 for dehydration/aki  Despite cefepime, her wound remains the same. Her current clinical picture is an iatrogenic result of cefepime/abx leading to dehydration/aki. Mentation slow too due to dehydration/aki, and potentially cefepime cns toxicity  Given lack of response of the wound to cefepime (as expected), unless she develop frank sepsis with clinical evidence worsening cellulitis/ssi, would avoid further abx for now  She has no evidence of other localizing infectious syndrome  ----- 2/10 assessment Strength/aki/appetite improved since admission. Remains off abx Ext wound stable and appears no active infection at this time   Tough case and poor prognosis for wound healing  Plan: 1. Await I&D of necrotic tissue 2. No indication for abx at this time 3. Continue supportive care for chronic wound 4. ID will sign off  Principal Problem:   Acute renal failure superimposed on stage 3a chronic kidney disease (HCC) Active Problems:   Chronic respiratory failure with hypoxia (HCC)   Hypothyroidism   Macrocytic anemia   Thrombocytosis   PAH (pulmonary artery hypertension) (HCC)   Chronic diastolic CHF (congestive heart failure) (HCC)   Non-healing wound of right lower extremity   Physical deconditioning   Scheduled Meds: . sodium chloride   Intravenous Once  . Chlorhexidine Gluconate Cloth  6 each Topical Daily  . cholecalciferol  2,000 Units Oral QHS  . citalopram  20 mg Oral QHS  . feeding supplement  237 mL Oral BID BM  . folic acid  1 mg Oral Daily  . iron  polysaccharides  150 mg Oral BID  . levothyroxine  50 mcg Oral QAC breakfast  . macitentan  10 mg Oral Daily  . mupirocin ointment   Topical BID  . potassium chloride  40 mEq Oral Once  . predniSONE  5 mg Oral Q breakfast  . rosuvastatin  5 mg Oral Q M,W,F  . sildenafil  60 mg Oral TID  . sodium chloride flush  10-40 mL Intracatheter Q12H   Continuous Infusions: . diltiazem (CARDIZEM) infusion 5 mg/hr (02/09/21 0500)  . heparin 1,050 Units/hr (02/09/21 0501)   PRN Meds:.acetaminophen **OR** acetaminophen, LORazepam, oxyCODONE, sodium chloride flush   SUBJECTIVE: Improved aki Afebrile RLE stable mild pain and no new erythema/swelling   Review of Systems: ROS Other ros negative  Allergies  Allergen Reactions  . Levofloxacin Other (See Comments)    FLU LIKE SYMPTOM   . Sulfa Antibiotics Rash and Other (See Comments)    Face peeled, Severe rash with significant peeling of face. No airway involvement per patient   . Doxycycline Hyclate Swelling    SWELLING REACTION UNSPECIFIED ; "TABLETS ONLY - CAPSULES ARE TOLERATED FINE"  . Penicillins Rash    Has patient had a PCN reaction causing immediate rash, facial/tongue/throat swelling, SOB or lightheadedness with hypotension: no Has  patient had a PCN reaction causing severe rash involving mucus membranes or skin necrosis: no Has patient had a PCN reaction that required hospitalization: no Has patient had a PCN reaction occurring within the last 10 years: {no If all of the above answers are "NO", then may proceed with Cephalosporin use.    OBJECTIVE: Vitals:   02/09/21 0000 02/09/21 0300 02/09/21 0800 02/09/21 0900  BP: (!) 121/53 (!) 122/57 130/60 130/82  Pulse: (!) 52 79 67 79  Resp: (!) 22 20 (!) 21 (!) 25  Temp: 98 F (36.7 C) 97.8 F (36.6 C) 97.6 F (36.4 C)   TempSrc:  Oral Oral   SpO2: 90% 90% (!) 86% 95%  Weight:  55.4 kg     Body mass index is 18.57 kg/m.  Physical Exam Chronically ill appearing, more  interactive today, some verbals Heent: atraumatic; per; conj clear Neck supple cv rrr no mrg Lungs clear; on baseline 2 liters oxygen supplement abd s/nt Ext mild edema on RLE; bilateral LE in coban dressing; the dressing with greenish stain; mild edge of ulcer erythema not worse Msk: s/p partial several fingers ampuation in the past.  Neuro nonfocal Psych alert/oriented  Lab Results Lab Results  Component Value Date   WBC 8.5 02/09/2021   HGB 7.9 (L) 02/09/2021   HCT 24.8 (L) 02/09/2021   MCV 98.4 02/09/2021   PLT 231 02/09/2021    Lab Results  Component Value Date   CREATININE 1.59 (H) 02/09/2021   BUN 30 (H) 02/09/2021   NA 134 (L) 02/09/2021   K 3.3 (L) 02/09/2021   CL 103 02/09/2021   CO2 19 (L) 02/09/2021    Lab Results  Component Value Date   ALT 23 02/08/2021   AST 43 (H) 02/08/2021   ALKPHOS 92 02/08/2021   BILITOT 0.8 02/08/2021     Microbiology: Recent Results (from the past 240 hour(s))  SARS CORONAVIRUS 2 (TAT 6-24 HRS) Nasopharyngeal Nasopharyngeal Swab     Status: None   Collection Time: 02/03/21 10:05 AM   Specimen: Nasopharyngeal Swab  Result Value Ref Range Status   SARS Coronavirus 2 NEGATIVE NEGATIVE Final    Comment: (NOTE) SARS-CoV-2 target nucleic acids are NOT DETECTED.  The SARS-CoV-2 RNA is generally detectable in upper and lower respiratory specimens during the acute phase of infection. Negative results do not preclude SARS-CoV-2 infection, do not rule out co-infections with other pathogens, and should not be used as the sole basis for treatment or other patient management decisions. Negative results must be combined with clinical observations, patient history, and epidemiological information. The expected result is Negative.  Fact Sheet for Patients: SugarRoll.be  Fact Sheet for Healthcare Providers: https://www.woods-mathews.com/  This test is not yet approved or cleared by the [place] FDA and  has been authorized for detection and/or diagnosis of SARS-CoV-2 by FDA under an Emergency Use Authorization (EUA). This EUA will remain  in effect (meaning this test can be used) for the duration of the COVID-19 declaration under Se ction 564(b)(1) of the Act, 21 U.S.C. section 360bbb-3(b)(1), unless the authorization is terminated or revoked sooner.  Performed at [facility], [place] [address] [place], [place] [address]   Urine culture     Status: Abnormal   Collection Time: 02/05/21  4:08 PM   Specimen: Urine, Clean Catch  Result Value Ref Range Status   Specimen Description   Final    URINE, CLEAN CATCH Performed at [facility], [place],  [place], [place] [address]    Special Requests   Final    NONE Performed at [facility], [address] [place], [place] [address]    Culture (A)  Final    <10,000 COLONIES/mL INSIGNIFICANT GROWTH Performed at [facility] [address], [place], [place] [address]    Report Status 02/07/2021 FINAL  Final  SARS CORONAVIRUS 2 (TAT 6-24 HRS) Nasopharyngeal     Status: None   Collection Time: 02/07/21 12:01 PM   Specimen: Nasopharyngeal  Result Value Ref Range Status   SARS Coronavirus 2 NEGATIVE NEGATIVE Final    Comment: (NOTE) SARS-CoV-2 target nucleic acids are NOT DETECTED.  The SARS-CoV-2 RNA is generally detectable in upper and lower respiratory specimens during the acute phase of infection. Negative results do not preclude SARS-CoV-2 infection, do not rule out co-infections with other pathogens, and should not be used as the sole basis for treatment or other patient management decisions. Negative results must be combined with clinical observations, patient history, and epidemiological information. The expected result is Negative.  Fact Sheet for Patients: SugarRoll.be  Fact Sheet for Healthcare  Providers: https://www.woods-mathews.com/  This test is not yet approved or cleared by the [place] FDA and  has been authorized for detection and/or diagnosis of SARS-CoV-2 by FDA under an Emergency Use Authorization (EUA). This EUA will remain  in effect (meaning this test can be used) for the duration of the COVID-19 declaration under Se ction 564(b)(1) of the Act, 21 U.S.C. section 360bbb-3(b)(1), unless the authorization is terminated or revoked sooner.  Performed at [facility], [place] [address] [place], [place] [address]      Jimeka Balan T Merilee Wible, [place] [facility] [phone] pager    02/09/2021, 11:38 AM

## 2021-02-09 NOTE — Progress Notes (Signed)
Patient is a 70 year old female status post debridement of right lower extremity wound, placement of wound matrix with Dr. [name] today.  Leave dressing in place for 3 days, after 3 days begin daily dressing changes with K-Y jelly applied to the Mepitel/Meshed dressing, cover with 4 x 4 gauze, ABD, wrap with Kerlix and Ace wrap.  We will place nursing order for nursing staff to assist with this.

## 2021-02-09 NOTE — Anesthesia Preprocedure Evaluation (Addendum)
Anesthesia Evaluation  Patient identified by MRN, date of birth, ID band Patient awake    Reviewed: Allergy & Precautions, H&P , NPO status , Patient's Chart, lab work & pertinent test results  Airway Mallampati: II   Neck ROM: full    Dental   Pulmonary neg pulmonary ROS,  Severe pulm HTN on sildenafil, 2-3LPM [place] when ambulating     breath sounds clear to auscultation       Cardiovascular hypertension, + Peripheral Vascular Disease, +CHF (severe RV failure and pulmonary HTN- follows w/ bensimhon) and + DOE  + Valvular Problems/Murmurs (mild MR, mod-severe TR) MR  Rhythm:regular Rate:Normal  Echo 02/08/21: 1. Abnormal septal motion . Left ventricular ejection fraction, by  estimation, is 55%. The left ventricle has normal function. The left  ventricle has no regional wall motion abnormalities. There is mild left  ventricular hypertrophy. Left ventricular  diastolic parameters were normal.  2. Right ventricular systolic function is severely reduced. The right  ventricular size is severely enlarged. There is severely elevated  pulmonary artery systolic pressure.  3. Left atrial size was severely dilated.  4. The mitral valve is normal in structure. Mild mitral valve  regurgitation. No evidence of mitral stenosis.  5. Severe pulmonary hypertension and cor pulmonale. Tricuspid valve  regurgitation is moderate to severe.  6. The aortic valve is tricuspid. Aortic valve regurgitation is not  visualized. Mild to moderate aortic valve sclerosis/calcification is  present, without any evidence of aortic stenosis.  7. The inferior vena cava is dilated in size with >50% respiratory  variability, suggesting right atrial pressure of 8 mmHg.    Neuro/Psych PSYCHIATRIC DISORDERS Anxiety Depression negative neurological ROS     GI/Hepatic Neg liver ROS, PUD, GERD  Medicated and Controlled,  Endo/Other  Hypothyroidism   Renal/GU Renal  InsufficiencyRenal diseaseCr 1.59  negative genitourinary   Musculoskeletal  (+) Arthritis , Osteoarthritis,  RLE nonhealing wound   Abdominal   Peds  Hematology  (+) Blood dyscrasia, anemia , H/H 7.9/24.8   Anesthesia Other Findings   Reproductive/Obstetrics negative OB ROS                            Anesthesia Physical Anesthesia Plan  ASA: IV  Anesthesia Plan: MAC and Regional   Post-op Pain Management:  Regional for Post-op pain   Induction: Intravenous  PONV Risk Score and Plan: 2 and Propofol infusion, TIVA and Treatment may vary due to age or medical condition  Airway Management Planned: Natural Airway and Simple Face Mask  Additional Equipment: None  Intra-op Plan:   Post-operative Plan:   Informed Consent: I have reviewed the patients History and Physical, chart, labs and discussed the procedure including the risks, benefits and alternatives for the proposed anesthesia with the patient or authorized representative who has indicated his/her understanding and acceptance.     Dental advisory given  Plan Discussed with: CRNA, Anesthesiologist and Surgeon  Anesthesia Plan Comments: (D/w Dr. [name]- strongly suggests regional w/ light sedation)       Anesthesia Quick Evaluation

## 2021-02-09 NOTE — Interval H&P Note (Signed)
History and Physical Interval Note:  02/09/2021 3:08 PM  [name] [name]  has presented today for surgery, with the diagnosis of Non-healing wound of right lower extremity.  The various methods of treatment have been discussed with the patient and family. After consideration of risks, benefits and other options for treatment, the patient has consented to  Procedure(s) with comments: Debridement of right lower extremity wound (Right) - 1 hour, please application of primatrix mesh (Right) as a surgical intervention.  The patient's history has been reviewed, patient examined, no change in status, stable for surgery.  I have reviewed the patient's chart and labs.  Questions were answered to the patient's satisfaction.     Cindra Presume

## 2021-02-09 NOTE — Progress Notes (Addendum)
Advanced Heart Failure Rounding Note  PCP-Cardiologist: Glori Bickers, MD   Subjective:    SCr improved w/ IVFs, 2.02>>1.91>>1.71>>1.59. Now off continuous IVFs.   Went into afib w/ RVR overnight on 2/9. Converted on cardizem gtt. Maintaining NSR w/ PACs  HS trop up to 441.   Echo yesterday showed severely enlarged RV w/ severely reduced systolic function and severely elevated RVSP at 113.0 mmHg. LVEF normal 55%. No RWMAs.   Scheduled for RLE wound debridement today, to be done w/ use of a block with conscious sedation.   She feels tired today.  Stable on [place]. No increased dyspnea beyond baseline. No CP.   Objective:   Weight Range: 55.4 kg Body mass index is 18.57 kg/m.   Vital Signs:   Temp:  [97.6 F (36.4 C)-98.4 F (36.9 C)] 97.6 F (36.4 C) (02/10 0800) Pulse Rate:  [52-79] 79 (02/10 0900) Resp:  [20-26] 25 (02/10 0900) BP: (115-130)/(52-82) 130/82 (02/10 0900) SpO2:  [86 %-95 %] 95 % (02/10 0900) Weight:  [55.4 kg] 55.4 kg (02/10 0300)    Weight change: Filed Weights   02/08/21 0122 02/09/21 0300  Weight: 55.6 kg 55.4 kg    Intake/Output:   Intake/Output Summary (Last 24 hours) at 02/09/2021 0910 Last data filed at 02/08/2021 1518 Gross per 24 hour  Intake 519.05 ml  Output --  Net 519.05 ml      Physical Exam    General:  Chronically ill/ fatigue appearing elderly WF. No resp difficulty HEENT: Normal Neck: Supple. JVP elevated to jaw . Carotids 2+ bilat; no bruits. No lymphadenopathy or thyromegaly appreciated. Cor: PMI nondisplaced. Regular rate & rhythm. No rubs, gallops or murmurs. Lungs: decreased BS at the bases Abdomen: Soft, nontender, nondistended. No hepatosplenomegaly. No bruits or masses. Good bowel sounds. Extremities: No cyanosis, clubbing, rash, edema + RLE wound dressing applied  Neuro: Alert & orientedx3, cranial nerves grossly intact. moves all 4 extremities w/o difficulty. Affect pleasant   Telemetry   NSR 80s w/ PACs    EKG   No new EKG to review   Labs    CBC Recent Labs    02/08/21 0448 02/09/21 0500  WBC 10.7* 8.5  NEUTROABS 8.2* 6.8  HGB 8.7* 7.9*  HCT 29.7* 24.8*  MCV 101.7* 98.4  PLT 275 287   Basic Metabolic Panel Recent Labs    02/08/21 0448 02/09/21 0500  NA 137 134*  K 3.6 3.3*  CL 104 103  CO2 15* 19*  GLUCOSE 62* 101*  BUN 34* 30*  CREATININE 1.71* 1.59*  CALCIUM 7.1* 6.9*  MG 1.5* 2.3   Liver Function Tests Recent Labs    02/07/21 1154 02/08/21 1006  AST 25 43*  ALT 15 23  ALKPHOS 70 92  BILITOT 0.8 0.8  PROT 5.9* 5.3*  ALBUMIN 2.4* 2.0*   Recent Labs    02/07/21 1154  LIPASE 113*   Cardiac Enzymes No results for input(s): CKTOTAL, CKMB, CKMBINDEX, TROPONINI in the last 72 hours.  BNP: BNP (last 3 results) No results for input(s): BNP in the last 8760 hours.  ProBNP (last 3 results) Recent Labs    11/21/20 1439  PROBNP 526.0*     D-Dimer No results for input(s): DDIMER in the last 72 hours. Hemoglobin A1C No results for input(s): HGBA1C in the last 72 hours. Fasting Lipid Panel No results for input(s): CHOL, HDL, LDLCALC, TRIG, CHOLHDL, LDLDIRECT in the last 72 hours. Thyroid Function Tests Recent Labs    02/08/21 0530  TSH 1.460    Other results:   Imaging    ECHOCARDIOGRAM COMPLETE  Result Date: 02/08/2021    ECHOCARDIOGRAM REPORT   Patient Name:   [name] [name] Date of Exam: 02/08/2021 Medical Rec #:  [id number]      Height:       68.0 in Accession #:    [id number]     Weight:       122.6 lb Date of Birth:  August 27, 1951      BSA:          1.660 m Patient Age:    70 years       BP:           141/75 mmHg Patient Gender: F              HR:           82 bpm. Exam Location:  Inpatient Procedure: 2D Echo, Color Doppler and Cardiac Doppler Indications:    I48.91* Unspecified atrial fibrillation  History:        Patient has prior history of Echocardiogram examinations, most                 recent 03/17/2020. CHF, Pulmonary HTN; Risk  Factors:Hypertension                 and Dyslipidemia.  Sonographer:    Raquel Sarna Senior RDCS Referring Phys: [id number] Shela Leff  Sonographer Comments: Difficult apical window, patient unable to reposition. IMPRESSIONS  1. Abnormal septal motion . Left ventricular ejection fraction, by estimation, is 55%. The left ventricle has normal function. The left ventricle has no regional wall motion abnormalities. There is mild left ventricular hypertrophy. Left ventricular diastolic parameters were normal.  2. Right ventricular systolic function is severely reduced. The right ventricular size is severely enlarged. There is severely elevated pulmonary artery systolic pressure.  3. Left atrial size was severely dilated.  4. The mitral valve is normal in structure. Mild mitral valve regurgitation. No evidence of mitral stenosis.  5. Severe pulmonary hypertension and cor pulmonale. Tricuspid valve regurgitation is moderate to severe.  6. The aortic valve is tricuspid. Aortic valve regurgitation is not visualized. Mild to moderate aortic valve sclerosis/calcification is present, without any evidence of aortic stenosis.  7. The inferior vena cava is dilated in size with >50% respiratory variability, suggesting right atrial pressure of 8 mmHg. FINDINGS  Left Ventricle: Abnormal septal motion. Left ventricular ejection fraction, by estimation, is 55%. The left ventricle has normal function. The left ventricle has no regional wall motion abnormalities. The left ventricular internal cavity size was normal  in size. There is mild left ventricular hypertrophy. Left ventricular diastolic parameters were normal. Right Ventricle: The right ventricular size is severely enlarged. Right vetricular wall thickness was not assessed. Right ventricular systolic function is severely reduced. There is severely elevated pulmonary artery systolic pressure. The tricuspid regurgitant velocity is 4.95 m/s, and with an assumed right atrial pressure of  15 mmHg, the estimated right ventricular systolic pressure is 376.2 mmHg. Left Atrium: Left atrial size was severely dilated. Right Atrium: Right atrial size was normal in size. Pericardium: There is no evidence of pericardial effusion. Mitral Valve: The mitral valve is normal in structure. There is mild thickening of the mitral valve leaflet(s). Mild mitral valve regurgitation. No evidence of mitral valve stenosis. Tricuspid Valve: Severe pulmonary hypertension and cor pulmonale. The tricuspid valve is normal in structure. Tricuspid valve regurgitation is moderate to severe. No evidence of tricuspid stenosis.  Aortic Valve: The aortic valve is tricuspid. Aortic valve regurgitation is not visualized. Mild to moderate aortic valve sclerosis/calcification is present, without any evidence of aortic stenosis. Pulmonic Valve: The pulmonic valve was normal in structure. Pulmonic valve regurgitation is mild. No evidence of pulmonic stenosis. Aorta: The aortic root is normal in size and structure. Venous: The inferior vena cava is dilated in size with greater than 50% respiratory variability, suggesting right atrial pressure of 8 mmHg. IAS/Shunts: No atrial level shunt detected by color flow Doppler.  LEFT VENTRICLE PLAX 2D LVIDd:         3.80 cm LVIDs:         2.30 cm LV PW:         1.30 cm LV IVS:        1.20 cm LVOT diam:     2.00 cm LV SV:         63 LV SV Index:   38 LVOT Area:     3.14 cm  RIGHT VENTRICLE RV S prime:     8.05 cm/s TAPSE (M-mode): 1.8 cm LEFT ATRIUM             Index       RIGHT ATRIUM           Index LA diam:        3.60 cm 2.17 cm/m  RA Area:     26.40 cm LA Vol (A2C):   39.3 ml 23.68 ml/m RA Volume:   102.00 ml 61.45 ml/m LA Vol (A4C):   55.5 ml 33.44 ml/m LA Biplane Vol: 51.1 ml 30.79 ml/m  AORTIC VALVE LVOT Vmax:   113.00 cm/s LVOT Vmean:  74.500 cm/s LVOT VTI:    0.200 m  AORTA Ao Root diam: 3.60 cm Ao Asc diam:  3.40 cm TRICUSPID VALVE TR Peak grad:   98.0 mmHg TR Vmax:        495.00 cm/s   SHUNTS Systemic VTI:  0.20 m Systemic Diam: 2.00 cm Jenkins Rouge MD Electronically signed by Jenkins Rouge MD Signature Date/Time: 02/08/2021/11:54:03 AM    Final      Medications:     Scheduled Medications: . Chlorhexidine Gluconate Cloth  6 each Topical Daily  . cholecalciferol  2,000 Units Oral QHS  . citalopram  20 mg Oral QHS  . feeding supplement  237 mL Oral BID BM  . folic acid  1 mg Oral Daily  . iron polysaccharides  150 mg Oral BID  . levothyroxine  50 mcg Oral QAC breakfast  . macitentan  10 mg Oral Daily  . mupirocin ointment   Topical BID  . potassium chloride  40 mEq Oral Once  . potassium chloride  40 mEq Oral Once  . predniSONE  5 mg Oral Q breakfast  . rosuvastatin  5 mg Oral Q M,W,F  . sildenafil  60 mg Oral TID  . sodium chloride flush  10-40 mL Intracatheter Q12H    Infusions: . diltiazem (CARDIZEM) infusion 5 mg/hr (02/09/21 0500)  . heparin 1,050 Units/hr (02/09/21 0501)    PRN Medications: acetaminophen **OR** acetaminophen, LORazepam, oxyCODONE, sodium chloride flush    Assessment/Plan   1. AKI - likely prerenal in the setting of diarrhea and recent reduced PO intake - SCr 2.0 on admit, baseline 1.2 - improved w/ IVFs, down 2.02>>1.91>>1.71>>1.59   2. 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 Unable to tolerate IV epoprostenol, Tyvaso or selexipag.She remains on Revatio and macitentan.  - Stable NYHA IIImacitentan 10 and revatio 60  tid  - RHC 4/18 as above -has now failed Selexipag as well as uptitration of her other PAH meds.  - Echo 12/18. Mild RV strain. Unable to estimate RVSP due to lack of significant TR  - Echo 01/16/2019 LVEF 60-65%, PA peak pressure 58 mm Hg. RV looks normal (much improved)  - Echo 3/21: LVEF 60-65% RV mildly dilated but function normal   - Echo 2/22: LVEF 55%, RV severely dilated/ severely reduced systolic function and severely elevated RVSP 113 mmHg - continue home PH regimen, macitentan 10 and revatio 60 tid  -  torsemide and spiro on hold w/ AKI - IVF hydration discontinued  - resume diuretics once AKI resolves  - Continue home oxygen. - She is very tenuous and likely close to end-stage. Will need to address CODE status prior to d/c.   3. Chronic Non-Healing Rt LE Wound  - PAD and scleroderma  - Followed closely by the wound center, ID and plastic surgery.  - She has been on cefepime since 01/24/2021. Plan is for debridement of devitalized tissue followed by application of wound matrix today  - She would be high risk under general anesthesia w/ underlying pulmonary HTN, RVSP 113 mmHg. Plan use of a block with conscious sedation.  4.  Scleroderma: - Advanced. She is followed byDr. Amil Amen.    5. Interstitial Lung Disease:  -Secondary toscleroderma.  - On Chronic Home O2  - Followed by Dr. Jerral Bonito chest CT reviewed and is stable   6. HTN  - controlled on current regimen, SBP 130s   7. Afib w/ RVR - Afib w/ RVR overnight 2/9, V-rates 160s - no prior documented history  - converted to NSR on Cardizem gtt. Remains in NSR   - Hypomagnesemia corrected w/ supplementation, 1.5>>2.3.  - Supp K today (3.3)  - started on heparin gtt. Continue perioperatively  - She declines long-term OA. Failed Xarelto recently (had DVT) due to bleeding and does not want AC. - On D/c will continue Plavix and not be on AC.    8. Elevated HS Troponin - troponin bump to 441, likely demand ischemia from rapid afib - denies CP  - suspect she has underlying CAD given h/o severe PAD but not a cath candidate  - Echo w/ preserved LVEF and no RWMAs    Length of Stay: 2  Lyda Jester, PA-C  02/09/2021, 9:10 AM  Advanced Heart Failure Team Pager [phone] (M-F; 7a - 4p)  Please contact [facility] for night-coverage after hours (4p -7a ) and weekends on [email or web address]  Patient seen and examined with the above-signed Advanced Practice Provider and/or Housestaff. I personally reviewed laboratory  data, imaging studies and relevant notes. I independently examined the patient and formulated the important aspects of the plan. I have edited the note to reflect any of my changes or salient points. I have personally discussed the plan with the patient and/or family.  Feels weak. No further AF. No CP or SOB. Remains on dilt drip and heparin. ECHO with severe PAH and RV dysfunction.   General:  Weak appearing. No resp difficulty HEENT: normal Neck: supple. JVP to jaw. Carotids 2+ bilat; no bruits. No lymphadenopathy or thryomegaly appreciated. Cor: PMI nondisplaced. Regular rate & rhythm. 2/6 TR Lungs: clear Abdomen: soft, nontender, nondistended. No hepatosplenomegaly. No bruits or masses. Good bowel sounds. Extremities: no cyanosis, clubbing, rash, 1+  Edema RLE wound  Neuro: alert & orientedx3, cranial nerves grossly intact. moves all 4 extremities w/o difficulty. Affect pleasant  She  has end-stage PAH. Not a candidate for IV therapies. No recurrent AF. Can change dilt to po. Can stop heparin. Discussed case with Dr. [name]. Can proceed to OR today with conscious sedation and block.   May need Palliative Involvement prior to d/c   Glori Bickers, MD  4:28 PM

## 2021-02-09 NOTE — Progress Notes (Addendum)
PROGRESS NOTE    [name] [name]  KCL:[id number] DOB: Sep 16, 1951 DOA: 02/06/2021 PCP: [name], MD   Brief Narrative: 70 year old with past medical history significant for pulmonary hypertension followed by Dr. 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  Of note patient was recently evaluated by plastic surgery Dr. [name] on 1/21//2022 with tentative plan for debridement of devitalized tissue of her  right lower extremity and application of a wound matrix.  Patient has been on cefepime since 01/24/2021.  Recommendation per infectious disease at that time was to continue on 2-week course, the day of admission was completion of 2 weeks.  Patient presented to the ER with concern of ongoing diarrhea and poor appetite for approximately the past week.  He states that she appears very weak and deconditioned and has been unable to stand, and her speech has also been mumbled, so he brought her to the ER for further evaluation.  Patient was found to have AKI with a creatinine of 2.0, usually baseline 1.3.  Patient was a started on IV fluids.  Husband noticed improvement of patient is paged in the ER.  Patient is chronically on 2 to 3 L of oxygen at baseline.  Heart failure team, ID and plastic surgery has been consulted. She  develop A. fib with RVR overnight.   Assessment & Plan:   Principal Problem:   Acute renal failure superimposed on stage 3a chronic kidney disease (HCC) Active Problems:   Chronic respiratory failure with hypoxia (HCC)   Hypothyroidism   Macrocytic anemia   Thrombocytosis   PAH (pulmonary artery hypertension) (HCC)   Chronic diastolic CHF (congestive heart failure) (HCC)   Non-healing wound of right lower extremity   Physical deconditioning   1-Acute on Chronic Stage III a Renal Failure: -CKD stage III a, cr baseline 1.3.  -Cr peak to 2. Down to 1.5.  -improved  with IV fluids.  -Likely pre-renal secondary to hypovolemia from diarrhea.  -IV fluids discontinue.   2-Metabolic acidosis;  -Could be related to renal failure.  -resolved with IV bicarb gtt.   3-A fib RVR;  -Overnight develops A fib RVR.  -Started on Cardizem Gtt.  -Cardiology following.  -on Heparin Gtt. No plan for anticoagulation at discharge.   4-nonhealing with off right lower extremity -ID consulted, they recommend to stop IV antibiotics. -Plastic surgery was informed of patient admission.- -High risk for procedure. Plan for local block no GA  5-Chronic diastolic heart failure, PAH: Continue to monitor volume status Continue to hold Lasix and spironolactone Appreciate  Dr. [name] assistance Resume Revatio and Opsumit.   6- deconditioning: Patient will need PT OT  7-Chronic respiratory failure with hypoxia: On chronic 2 to 3 L of oxygen at home. COVID-19 negative Resume prednisone.  8-Macrocytic  anemia: Hb slightly decrease to 8.7 -resume iron.  -hb down to 7.9. plan to transfuse one unit PRBC>   9-Hypothyroidism; resume synthroid./   10-Hypoglycemia; resolved 11-Hypomagnesemia; replaced.  Hypokalemia; replete orally.   Estimated body mass index is 18.57 kg/m as calculated from the following:   Height as of 02/05/21: _0  (1.727 m).   Weight as of this encounter: 55.4 kg.   DVT prophylaxis: heparin gtt Code Status: Full code Family Communication: Husband who was at bedside Disposition Plan:  Status is: Inpatient  Remains inpatient appropriate because:Hemodynamically unstable and IV treatments appropriate due to intensity of illness or inability to take PO   Dispo: The patient  is from: Home              Anticipated d/c is to: To be determined              Anticipated d/c date is: 3 days              Patient currently is not medically stable to d/c.   Difficult to place patient No        Consultants:   Cardiology  ID  Procedures:    none  Antimicrobials:    Subjective: She is feeling tired, weak.  No chest pain.   Objective: Vitals:   02/09/21 0000 02/09/21 0300 02/09/21 0800 02/09/21 0900  BP: (!) 121/53 (!) 122/57 130/60 130/82  Pulse: (!) 52 79 67 79  Resp: (!) 22 20 (!) 21 (!) 25  Temp: 98 F (36.7 C) 97.8 F (36.6 C) 97.6 F (36.4 C)   TempSrc:  Oral Oral   SpO2: 90% 90% (!) 86% 95%  Weight:  55.4 kg      Intake/Output Summary (Last 24 hours) at 02/09/2021 0958 Last data filed at 02/09/2021 5747 Gross per 24 hour  Intake 519.05 ml  Output 400 ml  Net 119.05 ml   Filed Weights   02/08/21 0122 02/09/21 0300  Weight: 55.6 kg 55.4 kg    Examination:  General exam: Chronic ill appearing Respiratory system: CTA Cardiovascular system: S 1, S 2 IRR Gastrointestinal system: BS present, soft, nt Central nervous system: alert. Extremities: LE with dressing.    Data Reviewed: I have personally reviewed following labs and imaging studies  CBC: Recent Labs  Lab 02/05/21 1608 02/07/21 0926 02/07/21 1154 02/08/21 0448 02/09/21 0500  WBC 11.1* 10.3 10.3 10.7* 8.5  NEUTROABS  --  8.1* 8.2* 8.2* 6.8  HGB 9.2* 9.4* 9.0* 8.7* 7.9*  HCT 28.8* 30.2* 30.4* 29.7* 24.8*  MCV 98.0 98.7 101.0* 101.7* 98.4  PLT 291 267 275 275 340   Basic Metabolic Panel: Recent Labs  Lab 02/05/21 1608 02/07/21 0926 02/07/21 1154 02/08/21 0448 02/09/21 0500  NA 134* 135 137 137 134*  K 3.3* 3.6 3.6 3.6 3.3*  CL 97* 98 100 104 103  CO2 25 21* 21* 15* 19*  GLUCOSE 112* 73 74 62* 101*  BUN 31* 36* 35* 34* 30*  CREATININE 1.23* 2.02* 1.91* 1.71* 1.59*  CALCIUM 8.4* 8.6* 8.1* 7.1* 6.9*  MG  --   --   --  1.5* 2.3   GFR: Estimated Creatinine Clearance: 28.4 mL/min (A) (by C-G formula based on SCr of 1.59 mg/dL (H)). Liver Function Tests: Recent Labs  Lab 02/05/21 1608 02/07/21 1154 02/08/21 1006  AST 18 25 43*  ALT _0 ALKPHOS 71 70 92  BILITOT 0.2* 0.8 0.8  PROT 6.7 5.9* 5.3*  ALBUMIN 2.7*  2.4* 2.0*   Recent Labs  Lab 02/07/21 1154  LIPASE 113*   No results for input(s): AMMONIA in the last 168 hours. Coagulation Profile: No results for input(s): INR, PROTIME in the last 168 hours. Cardiac Enzymes: No results for input(s): CKTOTAL, CKMB, CKMBINDEX, TROPONINI in the last 168 hours. BNP (last 3 results) Recent Labs    11/21/20 1439  PROBNP 526.0*   HbA1C: No results for input(s): HGBA1C in the last 72 hours. CBG: Recent Labs  Lab 02/08/21 1200  GLUCAP 92   Lipid Profile: No results for input(s): CHOL, HDL, LDLCALC, TRIG, CHOLHDL, LDLDIRECT in the last 72 hours. Thyroid Function Tests: Recent Labs  02/08/21 0530  TSH 1.460   Anemia Panel: Recent Labs    02/08/21 0530  VITAMINB12 1,429*  FOLATE 58.1   Sepsis Labs: No results for input(s): PROCALCITON, LATICACIDVEN in the last 168 hours.  Recent Results (from the past 240 hour(s))  SARS CORONAVIRUS 2 (TAT 6-24 HRS) Nasopharyngeal Nasopharyngeal Swab     Status: None   Collection Time: 02/03/21 10:05 AM   Specimen: Nasopharyngeal Swab  Result Value Ref Range Status   SARS Coronavirus 2 NEGATIVE NEGATIVE Final    Comment: (NOTE) SARS-CoV-2 target nucleic acids are NOT DETECTED.  The SARS-CoV-2 RNA is generally detectable in upper and lower respiratory specimens during the acute phase of infection. Negative results do not preclude SARS-CoV-2 infection, do not rule out co-infections with other pathogens, and should not be used as the sole basis for treatment or other patient management decisions. Negative results must be combined with clinical observations, patient history, and epidemiological information. The expected result is Negative.  Fact Sheet for Patients: SugarRoll.be  Fact Sheet for Healthcare Providers: https://www.woods-mathews.com/  This test is not yet approved or cleared by the [place] FDA and  has been authorized for detection  and/or diagnosis of SARS-CoV-2 by FDA under an Emergency Use Authorization (EUA). This EUA will remain  in effect (meaning this test can be used) for the duration of the COVID-19 declaration under Se ction 564(b)(1) of the Act, 21 U.S.C. section 360bbb-3(b)(1), unless the authorization is terminated or revoked sooner.  Performed at [facility], [place] [address], [place], [place] [address]   Urine culture     Status: Abnormal   Collection Time: 02/05/21  4:08 PM   Specimen: Urine, Clean Catch  Result Value Ref Range Status   Specimen Description   Final    URINE, CLEAN CATCH Performed at [facility], [address] [place], [place] [address]    Special Requests   Final    NONE Performed at [facility], [place] [address] [place], [place] [address]    Culture (A)  Final    <10,000 COLONIES/mL INSIGNIFICANT GROWTH Performed at [facility], [place] [address], [place], [place] [address]    Report Status 02/07/2021 FINAL  Final  SARS CORONAVIRUS 2 (TAT 6-24 HRS) Nasopharyngeal     Status: None   Collection Time: 02/07/21 12:01 PM   Specimen: Nasopharyngeal  Result Value Ref Range Status   SARS Coronavirus 2 NEGATIVE NEGATIVE Final    Comment: (NOTE) SARS-CoV-2 target nucleic acids are NOT DETECTED.  The SARS-CoV-2 RNA is generally detectable in upper and lower respiratory specimens during the acute phase of infection. Negative results do not preclude SARS-CoV-2 infection, do not rule out co-infections with other pathogens, and should not be used as the sole basis for treatment or other patient management decisions. Negative results must be combined with clinical observations, patient history, and epidemiological information. The expected result is Negative.  Fact Sheet for Patients: SugarRoll.be  Fact Sheet for Healthcare Providers: https://www.woods-mathews.com/  This test is not yet approved or  cleared by the [place] FDA and  has been authorized for detection and/or diagnosis of SARS-CoV-2 by FDA under an Emergency Use Authorization (EUA). This EUA will remain  in effect (meaning this test can be used) for the duration of the COVID-19 declaration under Se ction 564(b)(1) of the Act, 21 U.S.C. section 360bbb-3(b)(1), unless the authorization is terminated or revoked sooner.  Performed at [facility], [place]  [address], [place],  [address]          Radiology Studies: MR BRAIN WO CONTRAST  Result Date: 02/07/2021 CLINICAL DATA:  Weakness.  Mental status change. EXAM: MRI HEAD WITHOUT CONTRAST TECHNIQUE: Multiplanar, multiecho pulse sequences of the brain and surrounding structures were obtained without intravenous contrast. COMPARISON:  CT February 05, 2021. FINDINGS: Motion limited exam. Brain: No acute infarction, acute hemorrhage, hydrocephalus, extra-axial collection or mass lesion. Moderate diffuse cerebral atrophy with ex vacuo ventricular dilation. Moderate patchy T2/FLAIR hyperintensities within the white matter and pons, likely the sequela of chronic microvascular ischemic disease. 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 Remote right sellar bell ir lacunar infarct. Surrounding gliosis. Vascular: Major arterial flow voids are maintained at the skull base. Skull and upper cervical spine: Normal marrow signal. Sinuses/Orbits: Sinuses are largely clear.  Unremarkable orbits. Other: Left mastoid effusion. IMPRESSION: 1. No evidence of acute intracranial abnormality on this motion limited exam. 2. 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 3. Moderate chronic microvascular ischemic disease and generalized cerebral atrophy. 4. Left mastoid effusion. Electronically Signed   By: Margaretha Sheffield MD   On: 02/07/2021 10:45   DG CHEST PORT 1 VIEW  Result Date: 02/08/2021 CLINICAL DATA:  Tachycardia. EXAM: PORTABLE CHEST 1 VIEW  COMPARISON:  02/07/2021 FINDINGS: 0517 hours. The cardio pericardial silhouette is enlarged. Interstitial markings are diffusely coarsened with chronic features. No dense focal airspace consolidation. No pneumothorax or pleural effusion. Right PICC line again noted. Telemetry leads overlie the chest. IMPRESSION: Stable.  Cardiomegaly with chronic interstitial lung disease. Electronically Signed   By: Misty Stanley M.D.   On: 02/08/2021 05:56   ECHOCARDIOGRAM COMPLETE  Result Date: 02/08/2021    ECHOCARDIOGRAM REPORT   Patient Name:   TIPHANIE VO Date of Exam: 02/08/2021 Medical Rec #:  [id number]      Height:       68.0 in Accession #:    [id number]     Weight:       122.6 lb Date of Birth:  1951/12/04      BSA:          1.660 m Patient Age:    59 years       BP:           141/75 mmHg Patient Gender: F              HR:           82 bpm. Exam Location:  Inpatient Procedure: 2D Echo, Color Doppler and Cardiac Doppler Indications:    I48.91* Unspecified atrial fibrillation  History:        Patient has prior history of Echocardiogram examinations, most                 recent 03/17/2020. CHF, Pulmonary HTN; Risk Factors:Hypertension                 and Dyslipidemia.  Sonographer:    Raquel Sarna Senior RDCS Referring Phys: [id number] Shela Leff  Sonographer Comments: Difficult apical window, patient unable to reposition. IMPRESSIONS  1. Abnormal septal motion . Left ventricular ejection fraction, by estimation, is 55%. The left ventricle has normal function. The left ventricle has no regional wall motion abnormalities. There is mild left ventricular hypertrophy. Left ventricular diastolic parameters were normal.  2. Right ventricular systolic function is severely reduced. The right ventricular size is severely enlarged. There is severely elevated pulmonary artery systolic pressure.  3. Left atrial  size was severely dilated.  4. The mitral valve is normal in structure. Mild mitral valve regurgitation. No evidence of mitral  stenosis.  5. Severe pulmonary hypertension and cor pulmonale. Tricuspid valve regurgitation is moderate to severe.  6. The aortic valve is tricuspid. Aortic valve regurgitation is not visualized. Mild to moderate aortic valve sclerosis/calcification is present, without any evidence of aortic stenosis.  7. The inferior vena cava is dilated in size with >50% respiratory variability, suggesting right atrial pressure of 8 mmHg. FINDINGS  Left Ventricle: Abnormal septal motion. Left ventricular ejection fraction, by estimation, is 55%. The left ventricle has normal function. The left ventricle has no regional wall motion abnormalities. The left ventricular internal cavity size was normal  in size. There is mild left ventricular hypertrophy. Left ventricular diastolic parameters were normal. Right Ventricle: The right ventricular size is severely enlarged. Right vetricular wall thickness was not assessed. Right ventricular systolic function is severely reduced. There is severely elevated pulmonary artery systolic pressure. The tricuspid regurgitant velocity is 4.95 m/s, and with an assumed right atrial pressure of 15 mmHg, the estimated right ventricular systolic pressure is 195.0 mmHg. Left Atrium: Left atrial size was severely dilated. Right Atrium: Right atrial size was normal in size. Pericardium: There is no evidence of pericardial effusion. Mitral Valve: The mitral valve is normal in structure. There is mild thickening of the mitral valve leaflet(s). Mild mitral valve regurgitation. No evidence of mitral valve stenosis. Tricuspid Valve: Severe pulmonary hypertension and cor pulmonale. The tricuspid valve is normal in structure. Tricuspid valve regurgitation is moderate to severe. No evidence of tricuspid stenosis. Aortic Valve: The aortic valve is tricuspid. Aortic valve regurgitation is not visualized. Mild to moderate aortic valve sclerosis/calcification is present, without any evidence of aortic stenosis.  Pulmonic Valve: The pulmonic valve was normal in structure. Pulmonic valve regurgitation is mild. No evidence of pulmonic stenosis. Aorta: The aortic root is normal in size and structure. Venous: The inferior vena cava is dilated in size with greater than 50% respiratory variability, suggesting right atrial pressure of 8 mmHg. IAS/Shunts: No atrial level shunt detected by color flow Doppler.  LEFT VENTRICLE PLAX 2D LVIDd:         3.80 cm LVIDs:         2.30 cm LV PW:         1.30 cm LV IVS:        1.20 cm LVOT diam:     2.00 cm LV SV:         63 LV SV Index:   38 LVOT Area:     3.14 cm  RIGHT VENTRICLE RV S prime:     8.05 cm/s TAPSE (M-mode): 1.8 cm LEFT ATRIUM             Index       RIGHT ATRIUM           Index LA diam:        3.60 cm 2.17 cm/m  RA Area:     26.40 cm LA Vol (A2C):   39.3 ml 23.68 ml/m RA Volume:   102.00 ml 61.45 ml/m LA Vol (A4C):   55.5 ml 33.44 ml/m LA Biplane Vol: 51.1 ml 30.79 ml/m  AORTIC VALVE LVOT Vmax:   113.00 cm/s LVOT Vmean:  74.500 cm/s LVOT VTI:    0.200 m  AORTA Ao Root diam: 3.60 cm Ao Asc diam:  3.40 cm TRICUSPID VALVE TR Peak grad:   98.0 mmHg TR Vmax:  495.00 cm/s  SHUNTS Systemic VTI:  0.20 m Systemic Diam: 2.00 cm Jenkins Rouge MD Electronically signed by Jenkins Rouge MD Signature Date/Time: 02/08/2021/11:54:03 AM    Final         Scheduled Meds: . sodium chloride   Intravenous Once  . Chlorhexidine Gluconate Cloth  6 each Topical Daily  . cholecalciferol  2,000 Units Oral QHS  . citalopram  20 mg Oral QHS  . feeding supplement  237 mL Oral BID BM  . folic acid  1 mg Oral Daily  . iron polysaccharides  150 mg Oral BID  . levothyroxine  50 mcg Oral QAC breakfast  . macitentan  10 mg Oral Daily  . mupirocin ointment   Topical BID  . potassium chloride  40 mEq Oral Once  . predniSONE  5 mg Oral Q breakfast  . rosuvastatin  5 mg Oral Q M,W,F  . sildenafil  60 mg Oral TID  . sodium chloride flush  10-40 mL Intracatheter Q12H   Continuous  Infusions: . diltiazem (CARDIZEM) infusion 5 mg/hr (02/09/21 0500)  . heparin 1,050 Units/hr (02/09/21 0501)     LOS: 2 days    Time spent: 35 minutes     Jaliya Siegmann A Jabori Henegar, MD [facility]   If 7PM-7AM, please contact night-coverage [email or web address]  02/09/2021, 9:58 AM

## 2021-02-09 NOTE — Brief Op Note (Signed)
02/09/2021  4:45 PM  PATIENT:  [name] [name]  70 y.o. female  PRE-OPERATIVE DIAGNOSIS:  Non-healing wound of right lower extremity  POST-OPERATIVE DIAGNOSIS:  Non-healing wound of right lower extremity  PROCEDURE:  Procedure(s) with comments: Debridement of right lower extremity wound (Right) - 1 hour, please application of primatrix mesh (Right)  SURGEON:  Surgeon(s) and Role:    * Adelfa Lozito, Steffanie Dunn, MD - Primary  PHYSICIAN ASSISTANT: Software engineer, PA  ASSISTANTS: none   ANESTHESIA:   regional  EBL:  10   BLOOD ADMINISTERED:none  DRAINS: none   LOCAL MEDICATIONS USED:  MARCAINE     SPECIMEN:  No Specimen  DISPOSITION OF SPECIMEN:  PATHOLOGY  COUNTS:  YES  TOURNIQUET:  * No tourniquets in log *  DICTATION: .Dragon Dictation  PLAN OF CARE: Admit to inpatient   PATIENT DISPOSITION:  PACU - hemodynamically stable.   Delay start of Pharmacological VTE agent (>24hrs) due to surgical blood loss or risk of bleeding: not applicable

## 2021-02-09 NOTE — Transfer of Care (Signed)
Immediate Anesthesia Transfer of Care Note  Patient: [name] [name]  Procedure(s) Performed: Debridement of right lower extremity wound (Right Leg Lower) application of primatrix mesh (Right Leg Lower)  Patient Location: PACU  Anesthesia Type:MAC combined with regional for post-op pain  Level of Consciousness: awake, alert , oriented and patient cooperative  Airway & Oxygen Therapy: Patient Spontanous Breathing and Patient connected to face mask oxygen  Post-op Assessment: Report given to RN and Post -op Vital signs reviewed and stable  Post vital signs: Reviewed and stable  Last Vitals:  Vitals Value Taken Time  BP 121/60 02/09/21 1647  Temp    Pulse 78 02/09/21 1650  Resp 20 02/09/21 1650  SpO2 96 % 02/09/21 1650  Vitals shown include unvalidated device data.  Last Pain:  Vitals:   02/09/21 0800  TempSrc: Oral  PainSc:       Patients Stated Pain Goal: 3 (16/57/90 3833)  Complications: No complications documented.

## 2021-02-09 NOTE — Progress Notes (Signed)
[place] for Heparin Indication: atrial fibrillation  Allergies  Allergen Reactions  . Levofloxacin Other (See Comments)    FLU LIKE SYMPTOM   . Sulfa Antibiotics Rash and Other (See Comments)    Face peeled, Severe rash with significant peeling of face. No airway involvement per patient   . Doxycycline Hyclate Swelling    SWELLING REACTION UNSPECIFIED ; "TABLETS ONLY - CAPSULES ARE TOLERATED FINE"  . Penicillins Rash    Has patient had a PCN reaction causing immediate rash, facial/tongue/throat swelling, SOB or lightheadedness with hypotension: no Has patient had a PCN reaction causing severe rash involving mucus membranes or skin necrosis: no Has patient had a PCN reaction that required hospitalization: no Has patient had a PCN reaction occurring within the last 10 years: {no If all of the above answers are "NO", then may proceed with Cephalosporin use.    Patient Measurements: Weight: 55.4 kg (122 lb 2.2 oz)   Vital Signs: Temp: 97.6 F (36.4 C) (02/10 0800) Temp Source: Oral (02/10 0800) BP: 130/82 (02/10 0900) Pulse Rate: 79 (02/10 0900)  Labs: Recent Labs    02/07/21 1154 02/08/21 0448 02/08/21 1006 02/08/21 1404 02/08/21 2300 02/09/21 0500 02/09/21 0944  HGB 9.0* 8.7*  --   --   --  7.9*  --   HCT 30.4* 29.7*  --   --   --  24.8*  --   PLT 275 275  --   --   --  231  --   HEPARINUNFRC  --   --   --  0.18* 0.21*  --  0.26*  CREATININE 1.91* 1.71*  --   --   --  1.59*  --   TROPONINIHS  --  84* 441*  --   --   --   --     Estimated Creatinine Clearance: 28.4 mL/min (A) (by C-G formula based on SCr of 1.59 mg/dL (H)).   Medical History: Past Medical History:  Diagnosis Date  . Anemia   . Arthritis   . DOE (dyspnea on exertion)   . Epistaxis   . History of chicken pox   . Hypertension   . Hypothyroidism   . Leg ulcer ([place])   . Melanoma ([place]) 1999  . 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 Non Hodgkin's lymphoma  ([place])    Treated with chemo 2010, felt to be cured.  Marland Kitchen PAH (pulmonary artery hypertension) ([place])   . Peripheral arterial occlusive disease ([place])   . Pneumonia 2008  . Pulmonary hypertension ([place])   . Requires supplemental oxygen    2 liters-3liters when moving  . 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 F presents with. To begin heparin for afib. No AC PTA. Hgb 9 (8-9 over past month).   Heparin level subtherapeutic at 0.26 after 1500 unit re-bolus and increasing heparin gtt to 1050 units/hr. Hgb 7.9, plt wnl. No bleeding noted. Pt scheduled to go to the OR today for RLE wound debridement.   Goal of Therapy:  Heparin level 0.3-0.7 units/ml Monitor platelets by anticoagulation protocol: Yes  Plan:  Increase heparin gtt to 1150 units/hr Will f/u heparin restart after OR and timing next heparin level from restart Daily heparin level and [place], PharmD PGY1 Pharmacy Resident 02/09/2021 11:07 AM  Please check [email or web address] for unit-specific pharmacy phone numbers.

## 2021-02-09 NOTE — Op Note (Signed)
Operative Note   DATE OF OPERATION: 02/09/2021  SURGICAL DEPARTMENT: Plastic Surgery  PREOPERATIVE DIAGNOSES: Right leg wound  POSTOPERATIVE DIAGNOSES:  same  PROCEDURE: 1.  Debridement of right leg wound totaling 8 x 12 cm.  This was a sharp excisional debridement including skin and subcutaneous tissue 2.  Application of Primatrix AG mesh totaling 8 x 12 cm  SURGEON: Talmadge Coventry, MD  ASSISTANT: Verdie Shire, PA The advanced practice practitioner (APP) assisted throughout the case.  The APP was essential in retraction and counter traction when needed to make the case progress smoothly.  This retraction and assistance made it possible to see the tissue plans for the procedure.  The assistance was needed for blood control, tissue re-approximation and assisted with closure of the incision site.  ANESTHESIA:  General.   COMPLICATIONS: None.   INDICATIONS FOR PROCEDURE:  The patient, [name] [name] is a 70 y.o. female born on 1951-06-01, is here for treatment of chronic right lower extremity wound. MRN: [id number]  CONSENT:  Informed consent was obtained directly from the patient. Risks, benefits and alternatives were fully discussed. Specific risks including but not limited to bleeding, infection, hematoma, seroma, scarring, pain, contracture, asymmetry, wound healing problems, and need for further surgery were all discussed. The patient did have an ample opportunity to have questions answered to satisfaction.   DESCRIPTION OF PROCEDURE:  The patient was taken to the operating room. SCDs were placed and antibiotics were given.  Regional anesthesia was administered.  The patient's operative site was prepped and draped in a sterile fashion. A time out was performed and all information was confirmed to be correct.  Started by infiltrating the surrounding area of the wound with combination of lidocaine and Marcaine.  This was given time to work.  I then proceeded to sharply excise the  chronic eschar that was in place covering the majority of the wound bed.  This was done with pickups and a 15 blade and consisted of skin and subcutaneous tissue.  I then used the versa jet to gently debride the rest of the wound bed to remove as much fibrinous exudate and nonviable tissue was possible.  This essentially went down to her Achilles tendon tendon and some of her peroneal tendons.  Portions of the wound bed were still not actively bleeding at the end of the debridement.  I elected not to be more aggressive given her tenuous medical condition the likelihood of exposing more deeper structures.  At this point a piece of pry matrix AG mesh was brought onto the field and soaked in saline.  It was then applied to the wound and secured with 3-0 Vicryl sutures.  Mepitel was then sutured in place over top of this.  Mepitel was also placed over the anterior surface of the leg.  I then applied K-Y jelly, 4 x 4's, ABD and a soft wrap.  The patient tolerated the procedure well.  There were no complications. The patient was allowed to wake from anesthesia, and taken to the recovery room in satisfactory condition.

## 2021-02-09 NOTE — Anesthesia Procedure Notes (Signed)
Anesthesia Regional Block: Popliteal block   Pre-Anesthetic Checklist: ,, timeout performed, Correct Patient, Correct Site, Correct Laterality, Correct Procedure, Correct Position, site marked, Risks and benefits discussed,  Surgical consent,  Pre-op evaluation,  At surgeon's request and post-op pain management  Laterality: Right  Prep: chloraprep       Needles:  Injection technique: Single-shot  Needle Type: Echogenic Stimulator Needle          Additional Needles:   Procedures:, nerve stimulator,,,,,,,   Nerve Stimulator or Paresthesia:  Response: plantar flexion of foot, 0.45 mA,   Additional Responses:   Narrative:  Start time: 02/09/2021 3:20 PM End time: 02/09/2021 3:26 PM Injection made incrementally with aspirations every 5 mL.  Performed by: Personally  Anesthesiologist: Albertha Ghee, MD  Additional Notes: Functioning IV was confirmed and monitors were applied.  A 21m 21ga Arrow echogenic stimulator needle was used. Sterile prep and drape,hand hygiene and sterile gloves were used.  Negative aspiration and negative test dose prior to incremental administration of local anesthetic. The patient tolerated the procedure well.  Ultrasound guidance: relevent anatomy identified, needle position confirmed, local anesthetic spread visualized around nerve(s), vascular puncture avoided.  Image printed for medical record.

## 2021-02-09 NOTE — Progress Notes (Signed)
[place] for Heparin Indication: atrial fibrillation  Allergies  Allergen Reactions  . Levofloxacin Other (See Comments)    FLU LIKE SYMPTOM   . Sulfa Antibiotics Rash and Other (See Comments)    Face peeled, Severe rash with significant peeling of face. No airway involvement per patient   . Doxycycline Hyclate Swelling    SWELLING REACTION UNSPECIFIED ; "TABLETS ONLY - CAPSULES ARE TOLERATED FINE"  . Penicillins Rash    Has patient had a PCN reaction causing immediate rash, facial/tongue/throat swelling, SOB or lightheadedness with hypotension: no Has patient had a PCN reaction causing severe rash involving mucus membranes or skin necrosis: no Has patient had a PCN reaction that required hospitalization: no Has patient had a PCN reaction occurring within the last 10 years: {no If all of the above answers are "NO", then may proceed with Cephalosporin use.    Patient Measurements: Weight: 55.6 kg (122 lb 9.2 oz)   Vital Signs: Temp: 98 F (36.7 C) (02/10 0000) Temp Source: Oral (02/09 2050) BP: 121/53 (02/10 0000) Pulse Rate: 52 (02/10 0000)  Labs: Recent Labs    02/07/21 0926 02/07/21 1154 02/08/21 0448 02/08/21 1006 02/08/21 1404 02/08/21 2300  HGB 9.4* 9.0* 8.7*  --   --   --   HCT 30.2* 30.4* 29.7*  --   --   --   PLT 267 275 275  --   --   --   HEPARINUNFRC  --   --   --   --  0.18* 0.21*  CREATININE 2.02* 1.91* 1.71*  --   --   --   TROPONINIHS  --   --  84* 441*  --   --     Estimated Creatinine Clearance: 26.5 mL/min (A) (by C-G formula based on SCr of 1.71 mg/dL (H)).   Assessment: 70 y.o. female with Afib for heparin   Goal of Therapy:  Heparin level 0.3-0.7 units/ml Monitor platelets by anticoagulation protocol: Yes  Plan:  Heparin 1500 units IV bolus, then increase heparin 1050 units/hr Check heparin level in 8 hours.  Phillis Knack, PharmD, BCPS

## 2021-02-09 NOTE — Consult Note (Signed)
Saw the patient preoperatively today for evaluation of her right lower extremity wound.  She is had a chronic wound for quite some time that has had difficulty healing.  I had plan to debride this and applied wound matrix earlier this week however she was admitted to the hospital with dehydration and was in acute kidney injury that was thought to be prerenal.  She was subsequently hydrated however developed A. fib with RVR required treatment for that over the past day or so.  She did have demand ischemia and her troponins have been elevated.  Had a long discussion with the patient and her husband the anesthesiologist and her cardiologist about the risks and benefits of moving forward.  Cardiologist and anesthesiologist all agree that general anesthesia would not be safe for her but a block and some light sedation would be sufficient to accomplish the debridement.  This is considered to add minimal additional cardiac risk.  I explained to the husband and to the patient that I would plan to be reasonably conservative with the wound debridement as her overall condition seems to have deteriorated a bit.  We did discuss again the goals and although I do not expect her wound to heal the active cleaning this up might potentially improve the pain and potentially the infectious issues related to it.  We discussed wound debridement along with placement of pry matrix mesh in the event that a suitable wound bed can be obtained.  We discussed the risks include bleeding, infection, damage to surrounding structures and need for additional procedures.  All the patient's and her husband's questions were answered and we will plan to proceed today.

## 2021-02-09 NOTE — H&P (View-Only) (Signed)
Saw the patient preoperatively today for evaluation of her right lower extremity wound.  She is had a chronic wound for quite some time that has had difficulty healing.  I had plan to debride this and applied wound matrix earlier this week however she was admitted to the hospital with dehydration and was in acute kidney injury that was thought to be prerenal.  She was subsequently hydrated however developed A. fib with RVR required treatment for that over the past day or so.  She did have demand ischemia and her troponins have been elevated.  Had a long discussion with the patient and her husband the anesthesiologist and her cardiologist about the risks and benefits of moving forward.  Cardiologist and anesthesiologist all agree that general anesthesia would not be safe for her but a block and some light sedation would be sufficient to accomplish the debridement.  This is considered to add minimal additional cardiac risk.  I explained to the husband and to the patient that I would plan to be reasonably conservative with the wound debridement as her overall condition seems to have deteriorated a bit.  We did discuss again the goals and although I do not expect her wound to heal the active cleaning this up might potentially improve the pain and potentially the infectious issues related to it.  We discussed wound debridement along with placement of pry matrix mesh in the event that a suitable wound bed can be obtained.  We discussed the risks include bleeding, infection, damage to surrounding structures and need for additional procedures.  All the patient's and her husband's questions were answered and we will plan to proceed today.

## 2021-02-09 NOTE — Anesthesia Procedure Notes (Signed)
Procedure Name: MAC Date/Time: 02/09/2021 3:45 PM Performed by: Harden Mo, CRNA Pre-anesthesia Checklist: Patient identified, Emergency Drugs available, Suction available and Patient being monitored Patient Re-evaluated:Patient Re-evaluated prior to induction Oxygen Delivery Method: Simple face mask Preoxygenation: Pre-oxygenation with 100% oxygen Induction Type: IV induction Placement Confirmation: positive ETCO2 and breath sounds checked- equal and bilateral Dental Injury: Teeth and Oropharynx as per pre-operative assessment

## 2021-02-09 NOTE — Anesthesia Procedure Notes (Signed)
Anesthesia Regional Block: Adductor canal block   Pre-Anesthetic Checklist: ,, timeout performed, Correct Patient, Correct Site, Correct Laterality, Correct Procedure, Correct Position, site marked, Risks and benefits discussed,  Surgical consent,  Pre-op evaluation,  At surgeon's request and post-op pain management  Laterality: Right  Prep: chloraprep       Needles:  Injection technique: Single-shot  Needle Type: Echogenic Needle     Needle Length: 9cm  Needle Gauge: 21     Additional Needles:   Narrative:  Start time: 02/09/2021 3:22 PM End time: 02/09/2021 3:28 PM Injection made incrementally with aspirations every 5 mL.  Performed by: Personally  Anesthesiologist: Albertha Ghee, MD  Additional Notes: Pt tolerated the procedure well.

## 2021-02-09 NOTE — Plan of Care (Signed)
  Problem: Education: Goal: Ability to demonstrate management of disease process will improve Outcome: Progressing Goal: Ability to verbalize understanding of medication therapies will improve Outcome: Progressing   Problem: Activity: Goal: Capacity to carry out activities will improve Outcome: Progressing   

## 2021-02-10 ENCOUNTER — Encounter (HOSPITAL_COMMUNITY): Payer: Medicare Other

## 2021-02-10 ENCOUNTER — Other Ambulatory Visit: Payer: Self-pay | Admitting: Internal Medicine

## 2021-02-10 ENCOUNTER — Encounter (HOSPITAL_COMMUNITY): Payer: Self-pay | Admitting: Plastic Surgery

## 2021-02-10 DIAGNOSIS — N179 Acute kidney failure, unspecified: Secondary | ICD-10-CM | POA: Diagnosis not present

## 2021-02-10 LAB — CBC WITH DIFFERENTIAL/PLATELET
Abs Immature Granulocytes: 0.12 10*3/uL — ABNORMAL HIGH (ref 0.00–0.07)
Basophils Absolute: 0 10*3/uL (ref 0.0–0.1)
Basophils Relative: 0 %
Eosinophils Absolute: 0 10*3/uL (ref 0.0–0.5)
Eosinophils Relative: 0 %
HCT: 28.9 % — ABNORMAL LOW (ref 36.0–46.0)
Hemoglobin: 8.7 g/dL — ABNORMAL LOW (ref 12.0–15.0)
Immature Granulocytes: 1 %
Lymphocytes Relative: 6 %
Lymphs Abs: 0.5 10*3/uL — ABNORMAL LOW (ref 0.7–4.0)
MCH: 29.5 pg (ref 26.0–34.0)
MCHC: 30.1 g/dL (ref 30.0–36.0)
MCV: 98 fL (ref 80.0–100.0)
Monocytes Absolute: 0.6 10*3/uL (ref 0.1–1.0)
Monocytes Relative: 7 %
Neutro Abs: 7.5 10*3/uL (ref 1.7–7.7)
Neutrophils Relative %: 86 %
Platelets: 246 10*3/uL (ref 150–400)
RBC: 2.95 MIL/uL — ABNORMAL LOW (ref 3.87–5.11)
RDW: 19.6 % — ABNORMAL HIGH (ref 11.5–15.5)
WBC: 8.7 10*3/uL (ref 4.0–10.5)
nRBC: 0 % (ref 0.0–0.2)

## 2021-02-10 LAB — BPAM RBC
Blood Product Expiration Date: 202203032359
ISSUE DATE / TIME: 202202101304
Unit Type and Rh: 7300

## 2021-02-10 LAB — TYPE AND SCREEN
ABO/RH(D): B POS
Antibody Screen: NEGATIVE
Unit division: 0

## 2021-02-10 LAB — BASIC METABOLIC PANEL
Anion gap: 11 (ref 5–15)
BUN: 27 mg/dL — ABNORMAL HIGH (ref 8–23)
CO2: 20 mmol/L — ABNORMAL LOW (ref 22–32)
Calcium: 6.9 mg/dL — ABNORMAL LOW (ref 8.9–10.3)
Chloride: 102 mmol/L (ref 98–111)
Creatinine, Ser: 1.61 mg/dL — ABNORMAL HIGH (ref 0.44–1.00)
GFR, Estimated: 34 mL/min — ABNORMAL LOW (ref >60)
Glucose, Bld: 96 mg/dL (ref 70–99)
Potassium: 4 mmol/L (ref 3.5–5.1)
Sodium: 133 mmol/L — ABNORMAL LOW (ref 135–145)

## 2021-02-10 LAB — MAGNESIUM: Magnesium: 2.2 mg/dL (ref 1.7–2.4)

## 2021-02-10 MED ORDER — FUROSEMIDE 10 MG/ML IJ SOLN
40.0000 mg | Freq: Once | INTRAMUSCULAR | Status: AC
  Administered 2021-02-10: 40 mg via INTRAVENOUS
  Filled 2021-02-10: qty 4

## 2021-02-10 MED ORDER — DILTIAZEM HCL ER COATED BEADS 180 MG PO CP24: 180.0000 mg | ORAL_CAPSULE | Freq: Every day | ORAL | 3 refills | Status: DC

## 2021-02-10 MED ORDER — CITALOPRAM HYDROBROMIDE 20 MG PO TABS: 20.0000 mg | ORAL_TABLET | Freq: Every day | ORAL | 0 refills | Status: AC

## 2021-02-10 MED ORDER — LACTULOSE 10 GM/15ML PO SOLN
30.0000 g | Freq: Once | ORAL | Status: AC
  Administered 2021-02-10: 30 g via ORAL
  Filled 2021-02-10: qty 45

## 2021-02-10 MED ORDER — ADULT MULTIVITAMIN W/MINERALS CH
1.0000 | ORAL_TABLET | Freq: Every day | ORAL | Status: DC
  Filled 2021-02-10: qty 1

## 2021-02-10 MED ORDER — DILTIAZEM HCL ER COATED BEADS 180 MG PO CP24
180.0000 mg | ORAL_CAPSULE | Freq: Every day | ORAL | Status: DC
  Administered 2021-02-10: 180 mg via ORAL
  Filled 2021-02-10: qty 1

## 2021-02-10 MED ORDER — ENSURE MAX PROTEIN PO LIQD: 11.0000 [oz_av] | Freq: Every day | ORAL | 0 refills | Status: AC

## 2021-02-10 MED ORDER — JUVEN PO PACK: 1.0000 | PACK | Freq: Two times a day (BID) | ORAL | 0 refills | Status: AC

## 2021-02-10 MED ORDER — JUVEN PO PACK
1.0000 | PACK | Freq: Two times a day (BID) | ORAL | Status: DC
  Filled 2021-02-10: qty 1

## 2021-02-10 MED ORDER — ENSURE MAX PROTEIN PO LIQD
11.0000 [oz_av] | Freq: Every day | ORAL | Status: DC
  Filled 2021-02-10: qty 330

## 2021-02-10 MED FILL — CITALOPRAM HBR 20 MG TABLET: 20 | 30 days supply | Qty: 30 | Fill #0

## 2021-02-10 MED FILL — CARTIA XT 180 MG CAPSULE SA: 180 | 30 days supply | Qty: 30 | Fill #0

## 2021-02-10 NOTE — TOC Transition Note (Signed)
Transition of Care [facility]) - CM/SW Discharge Note   Patient Details  Name: AMEKA KRIGBAUM MRN: [id number] Date of Birth: May 16, 1951  [facility]) CM/SW Contact:  Zenon Mayo, RN Phone Number: 02/10/2021, 2:50 PM   Clinical Narrative:    Patient is from home with spouse,, he takes her to her MD apts, she has a scale at home, bp cuff, oximetry, walker , bsc, w/chair, and home oxygen.  [facility] [address] [facility] [place] but would like for [facility] to fill her meds for her today prior to dc. She states she eats a low sodium diet.  NCM offered choice, Spouse chose [facility], stating they have had them in the past.  NCM maed referral to [facility] with [place].  He is able to accept referral ,soc will begin 24 to 48 hrs post dc.  Final next level of care: [place] Barriers to Discharge: No Barriers Identified   Patient Goals and CMS Choice Patient states their goals for this hospitalization and ongoing recovery are:: get better, do everything on her own CMS Medicare.gov Compare Post Acute Care list provided to:: Patient Represenative (must comment) Choice offered to / list presented to : Spouse  Discharge Placement                       Discharge Plan and Services In-house Referral: NA Discharge Planning Services: CM Consult Post Acute Care Choice: Home Health            DME Agency: NA       HH Arranged: RN,PT,OT [place] Agency: [place] Date [facility] Agency Contacted: 02/10/21 Time [place]: [id number] Representative spoke with at [place]: [place] ([place]) Interventions     Readmission Risk Interventions Readmission Risk Prevention Plan 02/10/2021 11/25/2019  Transportation Screening Complete Complete  PCP or Specialist Appt within 3-5 Days - Complete  HRI or [place] - Complete  Social Work Consult for [place] Planning/Counseling - Complete  Palliative Care Screening -  Not Applicable  Medication Review [employer]) Complete Complete  PCP or Specialist appointment within 3-5 days of discharge Complete -  [place] or Home Care Consult Complete -  Palliative Care Screening Not Applicable -  [place] Not Applicable -  Some recent data might be hidden

## 2021-02-10 NOTE — Evaluation (Signed)
Physical Therapy Evaluation Patient Details Name: [name] [name] MRN: [id number] DOB: Jun 02, 1951 Today's Date: 02/10/2021   History of Present Illness  70 yo female with extensive PMH including pulmonary HTN (followed by Dr. 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 Brought to ED  02/06/21 by her husband due to poor appetite and diarrhea, with increased weakness and mumbled speech. In ED found to have elevated creatinine and was placed on fluids. Admitted for acute renal failure on stage III chronic kidney disease, and non-healing wound on R LE. Pt s/p debridement of R LE wound and placement of primatrix mesh 02/09/21  Clinical Impression  Pt asleep on entry in recliner, husband present and provides PLOF and home set up information. PTA pt living with husband in 2 story home with ramped entrance and bed and bath on first floor. Pt's husband provides assist with transfers from bed <>BSC<>transport chair<> recliner. Husband provides for ADLs and iADLs for the last month. Pt is limited in safe mobility by fatigue in presence of decreased strength, balance and endurance. Pt requires minAx2 for power up into standing with RW. Once in standing pt able to march in place x 10 before fatigue and need to sit back down. Given pt's home set up and care giver support, PT recommends d/c home with HHPT to work on strengthening and mobility. PT will continue to follow acutely.    Follow Up Recommendations Home health PT;Supervision/Assistance - 24 hour    Equipment Recommendations  None recommended by PT (has necessary equipment)    Recommendations for Other Services OT consult     Precautions / Restrictions Precautions Precautions: Fall Restrictions Weight Bearing Restrictions: No      Mobility  Bed Mobility               General bed mobility comments: OOB in recliner on entry    Transfers Overall  transfer level: Needs assistance   Transfers: Sit to/from Stand Sit to Stand: Min assist;+2 physical assistance         General transfer comment: minAx2 for power up and steadying with RW, vc for posterior pelvic tilt, upright chest and stepping feet backward under CoG  Ambulation/Gait             General Gait Details: deferred due to physician visits        Balance Overall balance assessment: Needs assistance Sitting-balance support: Feet supported;Bilateral upper extremity supported Sitting balance-Leahy Scale: Fair Sitting balance - Comments: able to static sit at edge of recliner for physical exam   Standing balance support: Bilateral upper extremity supported Standing balance-Leahy Scale: Poor Standing balance comment: requires UE support                             Pertinent Vitals/Pain Pain Assessment: Faces Faces Pain Scale: Hurts little more Pain Location: generalized with movement Pain Descriptors / Indicators: Grimacing;Guarding Pain Intervention(s): Limited activity within patient's tolerance;Monitored during session;Repositioned    Home Living Family/patient expects to be discharged to:: Private residence Living Arrangements: Spouse/significant other Available Help at Discharge: Family;Available 24 hours/day Type of Home: House Home Access: Stairs to enter;Ramped entrance Entrance Stairs-Rails: Can reach both Entrance Stairs-Number of Steps: 2 Home Layout: Two level;Able to live on main level with bedroom/bathroom Home Equipment: Gilford Rile - 2 wheels;Transport chair      Prior Function Level of Independence: Needs assistance   Gait / Transfers Assistance Needed:  last month husband pivots to transfport chair and BSC, prior to that able to ambulate household distance  ADL's / Homemaking Assistance Needed: husband assists with bathing and dressing and all iADLs  Comments: independent prior to pelvic fx a year ago     Hand Dominance    Dominant Hand: Left    Extremity/Trunk Assessment   Upper Extremity Assessment Upper Extremity Assessment: Defer to OT evaluation    Lower Extremity Assessment Lower Extremity Assessment: LLE deficits/detail;RLE deficits/detail RLE Deficits / Details: bandage over calf area, clean dry and intact, exposed skin proximal to bandage with slight warmth and edema in comparison, hip, knee, and ankle AROM lacking full extension and flexion, strength grossly 3/5, 4th toe blackened RLE Sensation: decreased light touch LLE Deficits / Details: bandage over ankle clean, dry and intact, hip, knee, and ankle AROM lacking full extension and flexion, strength grossly 3/5 LLE Sensation: decreased light touch    Cervical / Trunk Assessment Cervical / Trunk Assessment: Kyphotic  Communication   Communication: No difficulties  Cognition Arousal/Alertness: Awake/alert (asleep on entry, needs slight stimulus to wake) Behavior During Therapy: Flat affect Overall Cognitive Status: Within Functional Limits for tasks assessed                                        General Comments General comments (skin integrity, edema, etc.): VSS on 4L O2 via [place], bandages clean, dry and intact. Husband present during session provided home set up and PLOF while pt slept, and then provided assist for mobility    Exercises General Exercises - Lower Extremity Hip Flexion/Marching: AROM;Both;Standing   Assessment/Plan    PT Assessment Patient needs continued PT services  PT Problem List Decreased strength;Decreased range of motion;Decreased activity tolerance;Decreased balance;Decreased mobility;Decreased coordination;Decreased cognition;Cardiopulmonary status limiting activity;Pain;Decreased skin integrity;Impaired sensation       PT Treatment Interventions DME instruction;Gait training;Functional mobility training;Therapeutic activities;Therapeutic exercise;Balance training;Cognitive  remediation;Patient/family education    PT Goals (Current goals can be found in the Care Plan section)  Acute Rehab PT Goals Patient Stated Goal: go home PT Goal Formulation: With patient/family Time For Goal Achievement: 02/24/21 Potential to Achieve Goals: Fair    Frequency Min 3X/week    AM-PAC PT "6 Clicks" Mobility  Outcome Measure Help needed turning from your back to your side while in a flat bed without using bedrails?: A Little Help needed moving from lying on your back to sitting on the side of a flat bed without using bedrails?: A Lot Help needed moving to and from a bed to a chair (including a wheelchair)?: A Little Help needed standing up from a chair using your arms (e.g., wheelchair or bedside chair)?: A Little Help needed to walk in hospital room?: A Lot Help needed climbing 3-5 steps with a railing? : Total 6 Click Score: 14    End of Session Equipment Utilized During Treatment: Oxygen Activity Tolerance: Patient limited by fatigue Patient left: in chair;with call bell/phone within reach;with family/visitor present Nurse Communication: Mobility status PT Visit Diagnosis: Unsteadiness on feet (R26.81);Other abnormalities of gait and mobility (R26.89);Muscle weakness (generalized) (M62.81);Difficulty in walking, not elsewhere classified (R26.2);Adult, failure to thrive (R62.7)    Time: 1002-1034 PT Time Calculation (min) (ACUTE ONLY): 32 min   Charges:   PT Evaluation $PT Eval Moderate Complexity: 1 Mod PT Treatments $Therapeutic Activity: 8-22 mins        Alisha Bacus B. [employer] PT,  DPT Acute Rehabilitation Services Pager [phone] Office [phone]   [place] 02/10/2021, 10:45 AM

## 2021-02-10 NOTE — Anesthesia Postprocedure Evaluation (Signed)
Anesthesia Post Note  Patient: [name] [name]  Procedure(s) Performed: Debridement of right lower extremity wound (Right Leg Lower) application of primatrix mesh (Right Leg Lower)     Patient location during evaluation: PACU Anesthesia Type: Regional and MAC Level of consciousness: awake and alert Pain management: pain level controlled Vital Signs Assessment: post-procedure vital signs reviewed and stable Respiratory status: spontaneous breathing, nonlabored ventilation, respiratory function stable and patient connected to nasal cannula oxygen Cardiovascular status: stable and blood pressure returned to baseline Postop Assessment: no apparent nausea or vomiting Anesthetic complications: no   No complications documented.  Last Vitals:  Vitals:   02/10/21 0500 02/10/21 0700  BP: (!) 122/57 (!) 149/64  Pulse: 62 73  Resp: 20 20  Temp: 36.6 C 36.7 C  SpO2: 91% 90%    Last Pain:  Vitals:   02/10/21 0700  TempSrc: Oral  PainSc: 0-No pain                 Draysen Weygandt S

## 2021-02-10 NOTE — TOC Initial Note (Signed)
Transition of Care [facility]) - Initial/Assessment Note    Patient Details  Name: AARINI SLEE MRN: [id number] Date of Birth: January 19, 1951  Transition of Care [facility] CM/SW Contact:    Zenon Mayo, RN Phone Number: 02/10/2021, 2:43 PM  Clinical Narrative:                 Patient is from home with spouse,, he takes her to her MD apts, she has a scale at home, bp cuff, oximetry, walker , bsc, w/chair, and home oxygen.  [facility] [address] [facility] [place] but would like for [facility] to fill her meds for her today prior to dc. She states she eats a low sodium diet.  NCM offered choice, Spouse chose [facility], stating they have had them in the past.  NCM maed referral to [facility] with [place].  He is able to accept referral ,soc will begin 24 to 48 hrs post dc.    Expected Discharge Plan: [place] Barriers to Discharge: No Barriers Identified   Patient Goals and CMS Choice Patient states their goals for this hospitalization and ongoing recovery are:: get better, do everything on her own CMS Medicare.gov Compare Post Acute Care list provided to:: Patient Represenative (must comment) Choice offered to / list presented to : Spouse  Expected Discharge Plan and Services Expected Discharge Plan: [place] In-house Referral: NA Discharge Planning Services: CM Consult Post Acute Care Choice: [place] arrangements for the past 2 months: Single Family Home Expected Discharge Date: 02/10/21                 DME Agency: NA       HH Arranged: RN,PT,OT [place] Agency: [place] Date [facility] Agency Contacted: 02/10/21 Time HH Agency Contacted: 97 Representative spoke with at [place]: Tommi Rumps  Prior Living Arrangements/Services Living arrangements for the past 2 months: [place] Lives with:: Spouse Patient language and need for interpreter reviewed:: Yes Do you feel safe going back to the place where you live?: Yes       Need for Family Participation in Patient Care: Yes (Comment) Care giver support system in place?: Yes (comment) Current home services: DME (has home oxygen (3) liters with [employer], has bp cuff, walker, w/char, bsc, pulse oximeter, thermonitor) Criminal Activity/Legal Involvement Pertinent to Current Situation/Hospitalization: No - Comment as needed  Activities of Daily Living      Permission Sought/Granted                  Emotional Assessment Appearance:: Appears stated age Attitude/Demeanor/Rapport: Engaged Affect (typically observed): Appropriate Orientation: : Oriented to Self,Oriented to Place,Oriented to  Time,Oriented to Situation Alcohol / Substance Use: Not Applicable Psych Involvement: No (comment)  Admission diagnosis:  Weakness [R53.1] Acute cystitis without hematuria [N30.00] AKI (acute kidney injury) ([place]) [N17.9] Patient Active Problem List   Diagnosis Date Noted  . Open wound   . PAF (paroxysmal atrial fibrillation) ([place])   . AKI (acute kidney injury) ([place]) 02/07/2021  . Macrocytic anemia 02/07/2021  . Thrombocytosis 02/07/2021  . PAH (pulmonary artery hypertension) ([place]) 02/07/2021  . Chronic diastolic CHF (congestive heart failure) ([place]) 02/07/2021  . Non-healing wound of right lower extremity 02/07/2021  . Physical deconditioning 02/07/2021  . CREST syndrome ([place])   . Iron deficiency anemia due to chronic blood loss 01/26/2021  . Anemia due to chronic blood loss 01/18/2021  . Anxiety state 01/18/2021  . 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  Disorder of calcium metabolism 01/18/2021  . Gastro-esophageal reflux disease with esophagitis, without bleeding 01/18/2021  . Oxygen dependent 01/18/2021  . Ulcer of esophagus with bleeding 01/18/2021  . Gangrene of finger of right hand ([place]) 11/29/2020  . Malnutrition of moderate degree 11/22/2020  . GI bleed 11/21/2020  . Eustachian tube dysfunction, bilateral 07/29/2020  . Mixed conductive and sensorineural  hearing loss of both ears 07/29/2020  . Wrist fracture 11/24/2019  . Multiple closed stable fractures of pubic ramus ([place]) 11/23/2019  . Distal radius fracture, left 11/23/2019  . Fall at home, initial encounter 11/23/2019  . AMS (altered mental status) 11/12/2019  . Altered mental status 11/11/2019  . Hypomagnesemia 11/11/2019  . Osteoporosis 01/22/2019  . Hypothyroidism 06/16/2018  . Pulmonary hypertension associated with systemic disorder ([place]) 10/08/2017  . 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 Dry gangrene ([place]) 09/11/2017  . Medication reaction 09/11/2017  . ILD (interstitial lung disease) ([place]) 07/22/2017  . Nasal septal perforation 07/10/2017  . Chronic respiratory failure with hypoxia ([place]) 10/31/2016  . Atherosclerosis of native arteries of right leg with ulceration of unspecified site ([place]) 09/14/2016  . Prolonged Q-T interval on ECG 07/29/2016  . CKD (chronic kidney disease), stage III ([place]) 07/29/2016  . Scleroderma ([place]) 05/06/2016  . Depression 04/24/2016  . Extensor tenosynovitis of wrist, right 02/24/2016  . 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 Hypokalemia 10/07/2015  . 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[place]) 09/01/2015  . Allergic rhinitis 03/08/2015  . High risk medication use 04/09/2013  . Venous hypertension, chronic, with ulcer and inflammation ([place]) 01/29/2013  . Lymphoma, non-Hodgkin's ([place]) 01/27/2013  . Right heart failure ([place]) 01/27/2013  . Varicose veins of lower extremities with ulcer and inflammation ([place]) 07/24/2012  . 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[place]) 04/08/2012  . Pulmonary hypertension associated with systemic disorder ([place]) 03/25/2012  . Peripheral arterial occlusive disease ([place]) 08/10/2011  . Essential hypertension 08/10/2011  . Hyperlipidemia 08/10/2011  . Arterial embolus and thrombosis of lower extremity ([place]) 08/10/2011   PCP:  [name],  MD [facility]   [id number] [place] [place] [place]Phone: [phone]: [address] [place] [address]Phone: [phone]: [phone] [facility]Zacarias PontesTransitions of [place]- [address]Phone: [phone]: [phone]    Social Determinants of Health (SDOH) Interventions    Readmission Risk Interventions Readmission Risk Prevention Plan 02/10/2021 11/25/2019  Transportation Screening Complete Complete  PCP or Specialist Appt within 3-5 Days - Complete  HRI or [place]- Complete  Social Work Consult for R[place]orewoodPlanning/Counseling - Complete  Palliative Care Screening - Not Applicable  Medication Review (Press photographer Complete Complete  PCP or Specialist appointment within 3-5 days of discharge Complete -  [place]or Home Care Consult Complete -  Palliative Care Screening Not Applicable -  [place]s Ferry[place] Applicable -  Some recent data might be hidden

## 2021-02-10 NOTE — Progress Notes (Addendum)
Advanced Heart Failure Rounding Note  PCP-Cardiologist: Glori Bickers, MD   Subjective:    SCr improved w/ IVFs, 2.02>>1.91>>1.71>>1.59>>1.61. + 5 lbs today with UOP 868m. On 4L N/C.   Went into afib w/ RVR on 2/9. Converted on cardizem gtt. Maintaining NSR w/ PACs. HS trop up to 441.   Echo showed severely enlarged RV w/ severely reduced systolic function and severely elevated RVSP at 113.0 mmHg. LVEF normal 55%. No RWMAs.   S/P RLE wound debridement   [facility]  Reports feeling much better today.  Denies chest pain or SOB.  Has non-productive cough when laying back.  Denies abd pain, N/V.  No BM in several days.   Objective:   Weight Range: 57.9 kg Body mass index is 19.41 kg/m.   Vital Signs:   Temp:  [97.5 F (36.4 C)-98 F (36.7 C)] 98 F (36.7 C) (02/11 0700) Pulse Rate:  [62-84] 81 (02/11 0800) Resp:  [19-28] 28 (02/11 0800) BP: (111-152)/(49-89) 150/89 (02/11 0800) SpO2:  [89 %-97 %] 93 % (02/11 0800) Weight:  [57.9 kg] 57.9 kg (02/11 0500)    Weight change: Filed Weights   02/08/21 0122 02/09/21 0300 02/10/21 0500  Weight: 55.6 kg 55.4 kg 57.9 kg    Intake/Output:   Intake/Output Summary (Last 24 hours) at 02/10/2021 0944 Last data filed at 02/09/2021 2117 Gross per 24 hour  Intake 1386.86 ml  Output 405 ml  Net 981.86 ml      Physical Exam    General:  Chronic ill appearing HEENT: normal Neck: supple.   Cor: Regular rate & rhythm. No rubs, gallops or murmurs. Lungs: Bilateral rales anterior and posterior.   Abdomen: soft, nontender, + distended.  Good bowel sounds. Extremities: no cyanosis, clubbing, rash.  ACE wrap to BLE.   Neuro: alert & oriented x 3.  Affect pleasant  Telemetry   NSR 80s.  Personally reviewed.   EKG   No new EKG to review   Labs    CBC Recent Labs    02/09/21 1920 02/10/21 0320  WBC 9.9 8.7  NEUTROABS 8.8* 7.5  HGB 9.2* 8.7*  HCT 30.2* 28.9*  MCV 98.1 98.0  PLT 239 2443   Basic Metabolic Panel Recent Labs    02/09/21 0500 02/10/21 0320  NA 134* 133*  K 3.3* 4.0  CL 103 102  CO2 19* 20*  GLUCOSE 101* 96  BUN 30* 27*  CREATININE 1.59* 1.61*  CALCIUM 6.9* 6.9*  MG 2.3 2.2   Liver Function Tests Recent Labs    02/07/21 1154 02/08/21 1006  AST 25 43*  ALT 15 23  ALKPHOS 70 92  BILITOT 0.8 0.8  PROT 5.9* 5.3*  ALBUMIN 2.4* 2.0*   Recent Labs    02/07/21 1154  LIPASE 113*   Cardiac Enzymes No results for input(s): CKTOTAL, CKMB, CKMBINDEX, TROPONINI in the last 72 hours.  BNP: BNP (last 3 results) No results for input(s): BNP in the last 8760 hours.  ProBNP (last 3 results) Recent Labs    11/21/20 1439  PROBNP 526.0*     D-Dimer No results for input(s): DDIMER in the last 72 hours. Hemoglobin A1C No results for input(s): HGBA1C in the last 72 hours. Fasting Lipid Panel No results for input(s): CHOL, HDL, LDLCALC, TRIG, CHOLHDL, LDLDIRECT in the last 72 hours. Thyroid Function Tests Recent Labs    02/08/21 0530  TSH 1.460    Other results:   Imaging  No results found.   Medications:     Scheduled Medications: . Chlorhexidine Gluconate Cloth  6 each Topical Daily  . cholecalciferol  2,000 Units Oral QHS  . citalopram  20 mg Oral QHS  . enoxaparin (LOVENOX) injection  30 mg Subcutaneous Q24H  . feeding supplement  237 mL Oral BID BM  . folic acid  1 mg Oral Daily  . iron polysaccharides  150 mg Oral BID  . levothyroxine  50 mcg Oral QAC breakfast  . macitentan  10 mg Oral Daily  . mupirocin ointment   Topical BID  . potassium chloride  40 mEq Oral Once  . predniSONE  5 mg Oral Q breakfast  . rosuvastatin  5 mg Oral Q M,W,F  . sildenafil  60 mg Oral TID  . sodium chloride flush  10-40 mL Intracatheter Q12H    Infusions: . diltiazem (CARDIZEM) infusion 5 mg/hr (02/10/21 0321)    PRN Medications: acetaminophen **OR** acetaminophen, LORazepam, oxyCODONE, sodium chloride flush    Assessment/Plan    1. AKI - likely prerenal in the setting of diarrhea and recent reduced PO intake - SCr 2.0 on admit, baseline 1.2 - improved w/ IVFs, down 2.02>>1.91>>1.71>>1.59>>1.61   2. 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 Unable to tolerate IV epoprostenol, Tyvaso or selexipag.She remains on Revatio and macitentan.  - Stable NYHA IIImacitentan 10 and revatio 60 tid  - RHC 4/18 as above -has now failed Selexipag as well as uptitration of her other PAH meds.  - Echo 12/18. Mild RV strain. Unable to estimate RVSP due to lack of significant TR  - Echo 01/16/2019 LVEF 60-65%, PA peak pressure 58 mm Hg. RV looks normal (much improved)  - Echo 3/21: LVEF 60-65% RV mildly dilated but function normal   - Echo 2/22: LVEF 55%, RV severely dilated/ severely reduced systolic function and severely elevated RVSP 113 mmHg - continue home PH regimen, macitentan 10 and revatio 60 tid  - torsemide and spiro was on hold w/ AKI  - Will give lasix today due to rales and increase in weight.  - Continue home oxygen. - She is very tenuous and likely close to end-stage. Will need to address CODE status prior to d/c.   3. Chronic Non-Healing Rt LE Wound  - PAD and scleroderma  - Followed closely by the wound center, ID and plastic surgery.  - She has been on cefepime since 01/24/2021. - s/p debridement of devitalized tissue followed by application of wound matrix   4.  Scleroderma: - Advanced. She is followed byDr. Amil Amen.   5. Interstitial Lung Disease:  -Secondary toscleroderma.  - On Chronic Home O2  - Followed by Dr. Jerral Bonito chest CT reviewed and is stable   6. HTN  - controlled on current regimen, SBP 120-150s  7. Afib w/ RVR - Afib w/ RVR overnight 2/9, V-rates 160s - no prior documented history  - converted to NSR on Cardizem gtt. Remains in NSR  - Change Cardizem gtt to PO.   - Replete electrolytes PRN - Was on heparin gtt preoperatively.  - She declines long-term OA. Failed  Xarelto recently (had DVT) due to bleeding and does not want AC. - On D/c will continue Plavix and not be on AC.    8. Elevated HS Troponin - troponin bump to 441, likely demand ischemia from rapid afib - denies CP  - suspect she has underlying CAD given h/o severe PAD but not a cath candidate  - Echo w/ preserved LVEF and  no RWMAs    Length of Stay: 3  Carlene Coria, NP  02/10/2021, 9:44 AM  Advanced Heart Failure Team Pager [phone] (M-F; 7a - 4p)  Please contact [facility] for night-coverage after hours (4p -7a ) and weekends on [email or web address]  Patient seen and examined with the above-signed Advanced Practice Provider and/or Housestaff. I personally reviewed laboratory data, imaging studies and relevant notes. I independently examined the patient and formulated the important aspects of the plan. I have edited the note to reflect any of my changes or salient points. I have personally discussed the plan with the patient and/or family.   S/p wound I&D yesterday. Tolerated well. Remains in NSR on dilt drip. Denies SOB.   General:  Sitting up in chair. Weak appearing No resp difficulty HEENT: normal Neck: supple. JVP to jaw Carotids 2+ bilat; no bruits. No lymphadenopathy or thryomegaly appreciated. Cor: PMI nondisplaced. Regular rate & rhythm 3/6 TR. Loud P2 Lungs: clear Abdomen: soft, nontender, nondistended. No hepatosplenomegaly. No bruits or masses. Good bowel sounds. Extremities: no cyanosis, clubbing, rash, 1+ edema + LE dressings Neuro: alert & orientedx3, cranial nerves grossly intact. moves all 4 extremities w/o difficulty. Affect pleasant  She is improved. Will switch dilt drip to oral. Give one dose IV lasix. Otherwise ok to go home from our standpoint. Will need f/u in HF/PAH clinic.   Glori Bickers, MD  11:14 AM

## 2021-02-10 NOTE — Discharge Summary (Signed)
Physician Discharge Summary  [name] VQQ:[id number] DOB: [birth year] DOA: 02/06/2021  PCP: [name], MD  Admit date: 02/06/2021 Discharge date: 02/10/2021  Admitted From: Home  Disposition: Home   Recommendations for Outpatient Follow-up:  1. Follow up with PCP in 1-2 weeks 2. Please obtain BMP/CBC in one week 3. Needs to follow up with Dr [name] for further care of wound.  4. Follow up With cardiology for further care PHT and HF>  5. Needs Bmet to follow renal function.  6. Needs further discussion in regards Code status.    Home Health: yes.   Discharge Condition: Stable.  CODE STATUS: Full code Diet recommendation: Heart Healthy   Brief/Interim Summary: 70 year old with past medical history significant for pulmonary hypertension followed by Dr. 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  Of note patient was recently evaluated by plastic surgery Dr. [name] on 1/21//2022 with tentative plan for debridement of devitalized tissue of her  right lower extremity and application of a wound matrix.  Patient has been on cefepime since 01/24/2021.  Recommendation per infectious disease at that time was to continue on 2-week course, the day of admission was completion of 2 weeks.  Patient presented to the ER with concern of ongoing diarrhea and poor appetite for approximately the past week.  He states that she appears very weak and deconditioned and has been unable to stand, and her speech has also been mumbled, so he brought her to the ER for further evaluation.  Patient was found to have AKI with a creatinine of 2.0, usually baseline 1.3.  Patient was a started on IV fluids.  Husband noticed improvement of patient is paged in the ER.  Patient is chronically on 2 to 3 L of oxygen at baseline.  Heart failure team, ID and plastic surgery has been consulted. She  develop A. fib with  RVR overnight. HR controlled on diltiazem. No plan for anticoagulation at discharge per cardiology.   Patient underwent debridement of RLE wound. She needs to follow up with Dr [name].     1-Acute on Chronic Stage III a Renal Failure: -CKD stage III a, cr baseline 1.3.  -Cr peak to 2. Down to 1.5.  -improved with IV fluids.  -Likely pre-renal secondary to hypovolemia from diarrhea.  -IV fluids discontinue.  -stable.   2-Metabolic acidosis;  -Could be related to renal failure.  -resolved with IV bicarb gtt.   3-A fib RVR;  -Overnight develops A fib RVR.  -Started on Cardizem Gtt. Transition to oral Cardizem today.  -Cardiology following.  -Received  Heparin Gtt. No plan for anticoagulation at discharge.   4-nonhealing with off right lower extremity -ID consulted, they recommend to stop IV antibiotics. -Plastic surgery was informed of patient admission.- -High risk for procedure. Plan for local block no GA   5-Chronic diastolic heart failure, PAH, Cor pulmonary.   Continue to monitor volume status Continue to hold  Spironolactone at discharge. Resume torsemide.  Appreciate  Dr. [name] assistance Resume Revatio and Opsumit.   6- deconditioning: Patient will need PT OT [place] ordered.   7-Chronic respiratory failure with hypoxia: On chronic 2 to 3 L of oxygen at home. COVID-19 negative Continue with prednisone.  8-Macrocytic  anemia: Hb slightly decrease to 8.7 -resume iron.  -hb down to 7.9. received one unit PRBC> repeated hb 8.7   9-Hypothyroidism; resume synthroid./   10-Hypoglycemia; resolved 11-Hypomagnesemia; replaced.  Hypokalemia; replete orally.  Estimated body mass index is 18.57 kg/m as calculated from the following:   Height as of 02/05/21: _0  (1.727 m).   Weight as of this encounter: 55.4 kg.   Discharge Diagnoses:  Principal Problem:   AKI (acute kidney injury) ([place]) Active Problems:   Chronic respiratory failure with hypoxia  (H[facility])   Hypothyroidism   Macrocytic anemia   Thrombocytosis   PAH (pulmonary artery hypertension) (HCC)   Chronic diastolic CHF (congestive heart failure) (HCC)   Non-healing wound of right lower extremity   Physical deconditioning   Open wound   PAF (paroxysmal atrial fibrillation) [facility])    Discharge Instructions  Discharge Instructions    Diet - low sodium heart healthy   Complete by: As directed    Discharge wound care:   Complete by: As directed    Leave dressing in place for 3 days(from 2/10), after 3 days begin daily dressing changes with K-Y jelly applied to the Mepitel/Meshed dressing, cover with 4 x 4 gauze, ABD, wrap with Kerlix and Ace wrap.   Increase activity slowly   Complete by: As directed      Allergies as of 02/10/2021      Reactions   Levofloxacin Other (See Comments)   FLU LIKE SYMPTOM   Sulfa Antibiotics Rash, Other (See Comments)   Face peeled, Severe rash with significant peeling of face. No airway involvement per patient   Doxycycline Hyclate Swelling   SWELLING REACTION UNSPECIFIED ; "TABLETS ONLY - CAPSULES ARE TOLERATED FINE"   Penicillins Rash   Has patient had a PCN reaction causing immediate rash, facial/tongue/throat swelling, SOB or lightheadedness with hypotension: no Has patient had a PCN reaction causing severe rash involving mucus membranes or skin necrosis: no Has patient had a PCN reaction that required hospitalization: no Has patient had a PCN reaction occurring within the last 10 years: {no If all of the above answers are "NO", then may proceed with Cephalosporin use.      Medication List    STOP taking these medications   HYDROcodone-acetaminophen 5-325 MG tablet Commonly known as: Norco   silver sulfADIAZINE 1 % cream Commonly known as: SILVADENE   spironolactone 25 MG tablet Commonly known as: ALDACTONE     TAKE these medications   acetaminophen 650 MG CR tablet Commonly known as: TYLENOL Take 1,300 mg by mouth every 8  (eight) hours as needed for pain.   Biotin 5000 MCG Caps Take 5,000 mcg by mouth daily.   CALCIUM 1200 PO Take 1 tablet by mouth at bedtime.   citalopram 20 MG tablet Commonly known as: CELEXA Take 1 tablet (20 mg total) by mouth at bedtime.   clopidogrel 75 MG tablet Commonly known as: PLAVIX TAKE 1 TABLET BY MOUTH EVERY DAY   diltiazem 180 MG 24 hr capsule Commonly known as: CARDIZEM CD Take 1 capsule (180 mg total) by mouth daily. Start taking on: February 11, 2021   Ferrex 150 150 MG capsule Generic drug: iron polysaccharides TAKE 1 CAPSULE BY MOUTH TWICE A DAY What changed: how much to take   fish oil-omega-3 fatty acids 1000 MG capsule Take 1,000 mg by mouth 2 (two) times daily.   folic acid 1 MG tablet Commonly known as: FOLVITE TAKE 1 TABLET BY MOUTH EVERY DAY   levothyroxine 50 MCG tablet Commonly known as: SYNTHROID TAKE 1 TABLET BY MOUTH DAILY BEFORE BREAKFAST What changed: See the new instructions.   loperamide 2 MG capsule Commonly known as: IMODIUM Take 2-4 mg by mouth  4 (four) times daily as needed for diarrhea or loose stools.   MAGNESIUM ASPARTATE PO Take 150 mg by mouth in the morning and at bedtime.   multivitamin with minerals Tabs tablet Take 1 tablet by mouth daily at 12 noon.   nutrition supplement (JUVEN) Pack Take 1 packet by mouth 2 (two) times daily between meals.   Ensure Max Protein Liqd Take 330 mLs (11 oz total) by mouth at bedtime.   Opsumit 10 MG tablet Generic drug: macitentan Take 1 tablet (42m) by mouth daily. What changed:   how much to take  how to take this  when to take this  additional instructions   OXYGEN Inhale 2-3 L into the lungs continuous.   pantoprazole 40 MG tablet Commonly known as: PROTONIX Take 1 tablet (40 mg total) by mouth 2 (two) times daily.   potassium chloride 20 MEQ/15ML (10%) Soln TAKE 2 TEASPOONFUL (DILUTED IN WATER) ONCE DAILY What changed: See the new instructions.    predniSONE 5 MG tablet Commonly known as: DELTASONE Take 5 mg by mouth daily.   Restasis 0.05 % ophthalmic emulsion Generic drug: cycloSPORINE Place 2 drops into both eyes 2 (two) times daily.   rosuvastatin 5 MG tablet Commonly known as: CRESTOR Take 1 tablet every Monday, Wednesday, and Friday. What changed:   how much to take  how to take this  when to take this  additional instructions   sildenafil 20 MG tablet Commonly known as: REVATIO Take 3 tablets (60 mg total) by mouth 3 (three) times daily.   torsemide 20 MG tablet Commonly known as: DEMADEX Take 20-40 mg by mouth See admin instructions. Take 2 tablets (40 mg) by mouth in the morning & take 1 tablet (20 mg) by mouth at night.   traMADol 50 MG tablet Commonly known as: ULTRAM Take 50 mg by mouth every 6 (six) hours as needed for moderate pain or severe pain.   valACYclovir 500 MG tablet Commonly known as: Valtrex Take 1 tablet (500 mg total) by mouth 2 (two) times daily. What changed:   when to take this  reasons to take this   vitamin B-12 1000 MCG tablet Commonly known as: CYANOCOBALAMIN Take 1,000 mcg by mouth daily.   Vitamin D 50 MCG (2000 UT) Caps Take 2,000 Units by mouth at bedtime.            Discharge Care Instructions  (From admission, onward)         Start     Ordered   02/10/21 0000  Discharge wound care:       Comments: Leave dressing in place for 3 days(from 2/10), after 3 days begin daily dressing changes with K-Y jelly applied to the Mepitel/Meshed dressing, cover with 4 x 4 gauze, ABD, wrap with Kerlix and Ace wrap.   02/10/21 1436          Follow-up Information    PCindra Presume MD Follow up in 1 week(s).   Specialty: Plastic Surgery Contact information: [id number]       BElby Showers MD Follow up in 1 week(s).   Specialty: Internal Medicine Contact information: [address] [place] [phone]       Bensimhon, DShaune Pascal MD .   Specialty: Cardiology Contact information: [phone]             Allergies  Allergen  Reactions  . Levofloxacin Other (See Comments)    FLU LIKE SYMPTOM   . Sulfa Antibiotics Rash and Other (See Comments)    Face peeled, Severe rash with significant peeling of face. No airway involvement per patient   . Doxycycline Hyclate Swelling    SWELLING REACTION UNSPECIFIED ; "TABLETS ONLY - CAPSULES ARE TOLERATED FINE"  . Penicillins Rash    Has patient had a PCN reaction causing immediate rash, facial/tongue/throat swelling, SOB or lightheadedness with hypotension: no Has patient had a PCN reaction causing severe rash involving mucus membranes or skin necrosis: no Has patient had a PCN reaction that required hospitalization: no Has patient had a PCN reaction occurring within the last 10 years: {no If all of the above answers are "NO", then may proceed with Cephalosporin use.    Consultations:  Cardiology    Procedures/Studies: CT Angio Head W or Wo Contrast  Result Date: 02/05/2021 CLINICAL DATA:  Neuro deficit, acute stroke suspected.  New aphasia. EXAM: CT ANGIOGRAPHY HEAD AND NECK TECHNIQUE: Multidetector CT imaging of the head and neck was performed using the standard protocol during bolus administration of intravenous contrast. Multiplanar CT image reconstructions and MIPs were obtained to evaluate the vascular anatomy. Carotid stenosis measurements (when applicable) are obtained utilizing NASCET criteria, using the distal internal carotid diameter as the denominator. CONTRAST:  132m OMNIPAQUE IOHEXOL 350 MG/ML SOLN COMPARISON:  CT head December 14, 2020. CTA November 11, 2019 FINDINGS: CT HEAD FINDINGS Brain: High left remote parietal cortical infarct, similar to prior. Remote right cerebellar lacunar infarct. No evidence of acute large vascular territory  infarct. Patchy white matter hypoattenuation, likely the sequela of chronic microvascular ischemic disease. No acute hemorrhage. No hydrocephalus. Similar mild diffuse cerebral atrophy with ex vacuo ventricular dilation. No mass lesion or abnormal mass effect. Skull: No acute fracture. Prior right occipital craniectomy with cranioplasty. Sinuses: Sinuses are largely clear. Orbits: No acute findings. Review of the MIP images confirms the above findings CTA NECK FINDINGS Aortic arch: Calcific atherosclerosis. Great vessel origins are patent. Right carotid system: Predominately calcific atherosclerosis at the carotid bifurcation without evidence of greater than 50% stenosis. Left carotid system: Predominately calcific atherosclerosis at the carotid bifurcation without evidence of greater than 50% stenosis. Vertebral arteries: Mild atherosclerotic narrowing of bilateral vertebral artery origins. Mild to moderate stenosis of the distal right vertebral artery. Otherwise, no significant stenosis. Codominant. 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 Other neck: No suspicious mass or adenopathy Upper chest: Partially imaged diffuse nodular and ground-glass opacities in the lung apices, possibly slightly progressed since prior CT chest from 01/21/2019 and further characterized on that study. Esophageal dilation with debris within in the partially visualized lower esophagus Review of the MIP images confirms the above findings CTA HEAD FINDINGS Anterior circulation: Mild-to-moderate bilateral cavernous carotid narrowing secondary to predominately calcific atherosclerosis. Otherwise, no significant stenosis, proximal occlusion, aneurysm, or vascular malformation. Posterior circulation: Mild to moderate stenosis of the distal right vertebral artery. Otherwise, no significant stenosis. No large vessel occlusion.  No aneurysm. Venous sinuses: As permitted by contrast timing, patent. Review of the MIP images confirms the above findings IMPRESSION: CT head: 1. No evidence of acute intracranial abnormality. 2. 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 3. Chronic microvascular ischemic disease. CTA: 1. No emergent large vessel occlusion. 2. Similar mild to moderate bilateral cavernous carotid atherosclerosis. 3. Similar mild to moderate distal  right vertebral artery stenosis. 4. Partially imaged diffuse nodular and ground-glass opacities in the lung apices, possibly slightly progressed since prior CT chest from 01/21/2019 and further characterized on that study. A repeat high-resolution CT of the chest could further evaluate if clinically indicated. 5. Esophageal dilation with debris within in the partially visualized lower esophagus, compatible with history of scleroderma. This places the patient at risk for aspiration. Electronically Signed   By: Margaretha Sheffield MD   On: 02/05/2021 19:09   CT Angio Neck W and/or Wo Contrast  Result Date: 02/05/2021 CLINICAL DATA:  Neuro deficit, acute stroke suspected.  New aphasia. EXAM: CT ANGIOGRAPHY HEAD AND NECK TECHNIQUE: Multidetector CT imaging of the head and neck was performed using the standard protocol during bolus administration of intravenous contrast. Multiplanar CT image reconstructions and MIPs were obtained to evaluate the vascular anatomy. Carotid stenosis measurements (when applicable) are obtained utilizing NASCET criteria, using the distal internal carotid diameter as the denominator. CONTRAST:  160m OMNIPAQUE IOHEXOL 350 MG/ML SOLN COMPARISON:  CT head December 14, 2020. CTA November 11, 2019 FINDINGS: CT HEAD FINDINGS Brain: High left remote parietal cortical infarct, similar to prior. Remote right cerebellar lacunar infarct. No evidence of acute large vascular territory infarct. Patchy white matter hypoattenuation,  likely the sequela of chronic microvascular ischemic disease. No acute hemorrhage. No hydrocephalus. Similar mild diffuse cerebral atrophy with ex vacuo ventricular dilation. No mass lesion or abnormal mass effect. Skull: No acute fracture. Prior right occipital craniectomy with cranioplasty. Sinuses: Sinuses are largely clear. Orbits: No acute findings. Review of the MIP images confirms the above findings CTA NECK FINDINGS Aortic arch: Calcific atherosclerosis. Great vessel origins are patent. Right carotid system: Predominately calcific atherosclerosis at the carotid bifurcation without evidence of greater than 50% stenosis. Left carotid system: Predominately calcific atherosclerosis at the carotid bifurcation without evidence of greater than 50% stenosis. Vertebral arteries: Mild atherosclerotic narrowing of bilateral vertebral artery origins. Mild to moderate stenosis of the distal right vertebral artery. Otherwise, no significant stenosis. Codominant. 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 Other neck: No suspicious mass or adenopathy Upper chest: Partially imaged diffuse nodular and ground-glass opacities in the lung apices, possibly slightly progressed since prior CT chest from 01/21/2019 and further characterized on that study. Esophageal dilation with debris within in the partially visualized lower esophagus Review of the MIP images confirms the above findings CTA HEAD FINDINGS Anterior circulation: Mild-to-moderate bilateral cavernous carotid narrowing secondary to predominately calcific atherosclerosis. Otherwise, no significant stenosis, proximal occlusion, aneurysm, or vascular malformation. Posterior circulation: Mild to moderate stenosis of the distal right vertebral artery. Otherwise, no significant stenosis. No large vessel occlusion. No aneurysm. Venous sinuses: As permitted by  contrast timing, patent. Review of the MIP images confirms the above findings IMPRESSION: CT head: 1. No evidence of acute intracranial abnormality. 2. 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 3. Chronic microvascular ischemic disease. CTA: 1. No emergent large vessel occlusion. 2. Similar mild to moderate bilateral cavernous carotid atherosclerosis. 3. Similar mild to moderate distal right vertebral artery stenosis. 4. Partially imaged diffuse nodular and ground-glass opacities in the lung apices, possibly slightly progressed since prior CT chest from 01/21/2019 and further characterized on that study. A repeat high-resolution CT of the chest could further evaluate if clinically indicated. 5. Esophageal dilation with debris within in the partially visualized lower esophagus, compatible with history of scleroderma. This  places the patient at risk for aspiration. Electronically Signed   By: Margaretha Sheffield MD   On: 02/05/2021 19:09   MR BRAIN WO CONTRAST  Result Date: 02/07/2021 CLINICAL DATA:  Weakness.  Mental status change. EXAM: MRI HEAD WITHOUT CONTRAST TECHNIQUE: Multiplanar, multiecho pulse sequences of the brain and surrounding structures were obtained without intravenous contrast. COMPARISON:  CT February 05, 2021. FINDINGS: Motion limited exam. Brain: No acute infarction, acute hemorrhage, hydrocephalus, extra-axial collection or mass lesion. Moderate diffuse cerebral atrophy with ex vacuo ventricular dilation. Moderate patchy T2/FLAIR hyperintensities within the white matter and pons, likely the sequela of chronic microvascular ischemic disease. 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 Remote right sellar bell ir lacunar infarct. Surrounding gliosis. Vascular: Major arterial flow voids are maintained at the skull base. Skull and upper cervical spine: Normal marrow signal. Sinuses/Orbits: Sinuses are largely clear.  Unremarkable orbits.  Other: Left mastoid effusion. IMPRESSION: 1. No evidence of acute intracranial abnormality on this motion limited exam. 2. 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 3. Moderate chronic microvascular ischemic disease and generalized cerebral atrophy. 4. Left mastoid effusion. Electronically Signed   By: Margaretha Sheffield MD   On: 02/07/2021 10:45   DG CHEST PORT 1 VIEW  Result Date: 02/08/2021 CLINICAL DATA:  Tachycardia. EXAM: PORTABLE CHEST 1 VIEW COMPARISON:  02/07/2021 FINDINGS: 0517 hours. The cardio pericardial silhouette is enlarged. Interstitial markings are diffusely coarsened with chronic features. No dense focal airspace consolidation. No pneumothorax or pleural effusion. Right PICC line again noted. Telemetry leads overlie the chest. IMPRESSION: Stable.  Cardiomegaly with chronic interstitial lung disease. Electronically Signed   By: Misty Stanley M.D.   On: 02/08/2021 05:56   DG Chest Port 1 View  Result Date: 02/07/2021 CLINICAL DATA:  Weakness.  Central catheter placement EXAM: PORTABLE CHEST 1 VIEW COMPARISON:  November 21, 2020 FINDINGS: Central catheter tip is at the cavoatrial junction. No pneumothorax. There is interstitial thickening without frank edema or consolidation. Heart is mildly enlarged with pulmonary vascularity normal. No adenopathy. There is aortic atherosclerosis. No bone lesions. IMPRESSION: Central catheter tip at cavoatrial junction. No pneumothorax. Central interstitial thickening, likely indicative of a degree of chronic bronchitis. No edema or airspace opacity. Mild cardiomegaly. Aortic Atherosclerosis (ICD10-I70.0). Electronically Signed   By: Lowella Grip III M.D.   On: 02/07/2021 09:25   ECHOCARDIOGRAM COMPLETE  Result Date: 02/08/2021    ECHOCARDIOGRAM REPORT   Patient Name:   [name] [name] Date of Exam: 02/08/2021 Medical Rec #:  [id number]      Height:       68.0 in Accession #:    [id number]     Weight:       122.6 lb Date of  Birth:  [birth year]      BSA:          1.660 m Patient Age:    41 years       BP:           141/75 mmHg Patient Gender: F              HR:           82 bpm. Exam Location:  Inpatient Procedure: 2D Echo, Color Doppler and Cardiac Doppler Indications:    I48.91* Unspecified atrial fibrillation  History:        Patient has prior history of Echocardiogram examinations, most  recent 03/17/2020. CHF, Pulmonary HTN; Risk Factors:Hypertension                 and Dyslipidemia.  Sonographer:    Raquel Sarna Senior RDCS Referring Phys: [id number] Shela Leff  Sonographer Comments: Difficult apical window, patient unable to reposition. IMPRESSIONS  1. Abnormal septal motion . Left ventricular ejection fraction, by estimation, is 55%. The left ventricle has normal function. The left ventricle has no regional wall motion abnormalities. There is mild left ventricular hypertrophy. Left ventricular diastolic parameters were normal.  2. Right ventricular systolic function is severely reduced. The right ventricular size is severely enlarged. There is severely elevated pulmonary artery systolic pressure.  3. Left atrial size was severely dilated.  4. The mitral valve is normal in structure. Mild mitral valve regurgitation. No evidence of mitral stenosis.  5. Severe pulmonary hypertension and cor pulmonale. Tricuspid valve regurgitation is moderate to severe.  6. The aortic valve is tricuspid. Aortic valve regurgitation is not visualized. Mild to moderate aortic valve sclerosis/calcification is present, without any evidence of aortic stenosis.  7. The inferior vena cava is dilated in size with >50% respiratory variability, suggesting right atrial pressure of 8 mmHg. FINDINGS  Left Ventricle: Abnormal septal motion. Left ventricular ejection fraction, by estimation, is 55%. The left ventricle has normal function. The left ventricle has no regional wall motion abnormalities. The left ventricular internal cavity size was normal   in size. There is mild left ventricular hypertrophy. Left ventricular diastolic parameters were normal. Right Ventricle: The right ventricular size is severely enlarged. Right vetricular wall thickness was not assessed. Right ventricular systolic function is severely reduced. There is severely elevated pulmonary artery systolic pressure. The tricuspid regurgitant velocity is 4.95 m/s, and with an assumed right atrial pressure of 15 mmHg, the estimated right ventricular systolic pressure is 048.8 mmHg. Left Atrium: Left atrial size was severely dilated. Right Atrium: Right atrial size was normal in size. Pericardium: There is no evidence of pericardial effusion. Mitral Valve: The mitral valve is normal in structure. There is mild thickening of the mitral valve leaflet(s). Mild mitral valve regurgitation. No evidence of mitral valve stenosis. Tricuspid Valve: Severe pulmonary hypertension and cor pulmonale. The tricuspid valve is normal in structure. Tricuspid valve regurgitation is moderate to severe. No evidence of tricuspid stenosis. Aortic Valve: The aortic valve is tricuspid. Aortic valve regurgitation is not visualized. Mild to moderate aortic valve sclerosis/calcification is present, without any evidence of aortic stenosis. Pulmonic Valve: The pulmonic valve was normal in structure. Pulmonic valve regurgitation is mild. No evidence of pulmonic stenosis. Aorta: The aortic root is normal in size and structure. Venous: The inferior vena cava is dilated in size with greater than 50% respiratory variability, suggesting right atrial pressure of 8 mmHg. IAS/Shunts: No atrial level shunt detected by color flow Doppler.  LEFT VENTRICLE PLAX 2D LVIDd:         3.80 cm LVIDs:         2.30 cm LV PW:         1.30 cm LV IVS:        1.20 cm LVOT diam:     2.00 cm LV SV:         63 LV SV Index:   38 LVOT Area:     3.14 cm  RIGHT VENTRICLE RV S prime:     8.05 cm/s TAPSE (M-mode): 1.8 cm LEFT ATRIUM             Index  RIGHT ATRIUM           Index LA diam:        3.60 cm 2.17 cm/m  RA Area:     26.40 cm LA Vol (A2C):   39.3 ml 23.68 ml/m RA Volume:   102.00 ml 61.45 ml/m LA Vol (A4C):   55.5 ml 33.44 ml/m LA Biplane Vol: 51.1 ml 30.79 ml/m  AORTIC VALVE LVOT Vmax:   113.00 cm/s LVOT Vmean:  74.500 cm/s LVOT VTI:    0.200 m  AORTA Ao Root diam: 3.60 cm Ao Asc diam:  3.40 cm TRICUSPID VALVE TR Peak grad:   98.0 mmHg TR Vmax:        495.00 cm/s  SHUNTS Systemic VTI:  0.20 m Systemic Diam: 2.00 cm Jenkins Rouge MD Electronically signed by Jenkins Rouge MD Signature Date/Time: 02/08/2021/11:54:03 AM    Final    VAS [place] LOWER EXTREMITY VENOUS (DVT)  Result Date: 01/16/2021  Lower Venous DVT Study Indications: Pain, and Follow up previous thrombus.  Risk Factors: DVT Prior DVT per duplex 11/07/2020. Anticoagulation: Plavix. Limitations: Open wound and bandages. Comparison Study: Prior duplex 11/07/2020 showed distal right femoral and                   popliteal thrombus. Performing Technologist: Delorise Shiner RVT  Examination Guidelines: A complete evaluation includes B-mode imaging, spectral Doppler, color Doppler, and power Doppler as needed of all accessible portions of each vessel. Bilateral testing is considered an integral part of a complete examination. Limited examinations for reoccurring indications may be performed as noted. The reflux portion of the exam is performed with the patient in reverse Trendelenburg.  +---------+---------------+---------+-----------+---------------+-------------+ RIGHT    CompressibilityPhasicitySpontaneityProperties     Thrombus                                                                 Aging         +---------+---------------+---------+-----------+---------------+-------------+ CFV      Full           Yes      Yes                                     +---------+---------------+---------+-----------+---------------+-------------+ SFJ      Full           Yes      Yes                                      +---------+---------------+---------+-----------+---------------+-------------+ FV Prox  Full           Yes      Yes                                     +---------+---------------+---------+-----------+---------------+-------------+ FV Mid   Full           Yes      Yes                                     +---------+---------------+---------+-----------+---------------+-------------+  FV DistalPartial        Yes      Yes        brightly       Chronic                                                   echogenic                    +---------+---------------+---------+-----------+---------------+-------------+ PFV      Full           Yes                                              +---------+---------------+---------+-----------+---------------+-------------+ POP      Full           Yes      Yes                                     +---------+---------------+---------+-----------+---------------+-------------+ GSV      Full           Yes      Yes                                     +---------+---------------+---------+-----------+---------------+-------------+ SSV      Full                                                            +---------+---------------+---------+-----------+---------------+-------------+ Previously documented thrombus was identified in the distal femoral vein, where it now appears to be chronic and recanalized. Deep venous reflux is seen bilaterally. Limited imaging in the right calf secondary to bandages and open wounds.  +----+---------------+---------+-----------+----------+--------------+ LEFTCompressibilityPhasicitySpontaneityPropertiesThrombus Aging +----+---------------+---------+-----------+----------+--------------+ CFV Full           Yes      Yes                                 +----+---------------+---------+-----------+----------+--------------+    Findings reported to Dr. Dellia Nims  at 15:23.  Summary: RIGHT: - There is recanalized thrombus in the right femoral vein(s). - Findings consistent with chronic deep vein thrombosis involving the right femoral vein. - Findings appear improved from previous examination. - There is no evidence of superficial venous thrombosis.  - No cystic structure found in the popliteal fossa.  LEFT: - No evidence of common femoral vein obstruction.  *See table(s) above for measurements and observations. Electronically signed by Servando Snare MD on 01/16/2021 at 7:19:56 AM.    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 Request PICC line placement for prolonged IV antibiotic therapy EXAM: PICC LINE PLACEMENT WITH ULTRASOUND AND FLUOROSCOPIC GUIDANCE MEDICATIONS: None. ANESTHESIA/SEDATION: None. FLUOROSCOPY TIME:  Fluoroscopy Time: 0  minutes 12 seconds (0 mGy). COMPLICATIONS: None immediate. PROCEDURE: The procedure, risks, benefits, and alternatives were explained to the patient and informed written consent was obtained. A timeout was performed prior to the initiation of the procedure. The right upper extremity was prepped with chlorhexidine in a sterile fashion, and a sterile drape was applied covering the operative field. Maximum barrier sterile technique with sterile gowns and gloves were used for the procedure. A timeout was performed prior to the initiation of the procedure. Local anesthesia was provided with 1% lidocaine. Under direct ultrasound guidance, the basilic vein was accessed with a micropuncture kit after the overlying soft tissues were anesthetized with 1% lidocaine. After the overlying soft tissues were anesthetized, a small venotomy incision was created and a micropuncture kit was utilized to access the right basilic vein. Real-time ultrasound guidance was utilized for vascular access including the acquisition of a permanent  ultrasound image documenting patency of the accessed vessel. A guidewire was advanced to the level of the superior caval-atrial junction for measurement purposes and the PICC line was cut to length. A peel-away sheath was placed and a 39 cm, 5 [place], single lumen was inserted to level of the superior caval-atrial junction. A post procedure spot fluoroscopic was obtained. The catheter easily aspirated and flushed and was secured in place. A dressing was placed. The patient tolerated the procedure well without immediate post procedural complication. IMPRESSION: Successful ultrasound and fluoroscopic guided placement of a right basilic vein approach, 39 cm, 5 French, single lumen PICC with tip at the superior caval-atrial junction. The PICC line is ready for immediate use. Read by: Ascencion Dike PA-C Electronically Signed   By: Jacqulynn Cadet M.D.   On: 02/03/2021 11:19      Subjective: She is breathing better, feels better. Feels weak.   Discharge Exam: Vitals:   02/10/21 1010 02/10/21 1048  BP: (!) 141/50 (!) 141/50  Pulse: 91 91  Resp: 15 15  Temp: 98.6 F (37 C)   SpO2:       General: Pt is alert, awake, not in acute distress Cardiovascular: RRR, S1/S2 +, no rubs, no gallops Respiratory: CTA bilaterally, no wheezing, no rhonchi Abdominal: Soft, NT, ND, bowel sounds + Extremities: no edema, no cyanosis    The results of significant diagnostics from this hospitalization (including imaging, microbiology, ancillary and laboratory) are listed below for reference.     Microbiology: Recent Results (from the past 240 hour(s))  SARS CORONAVIRUS 2 (TAT 6-24 HRS) Nasopharyngeal Nasopharyngeal Swab     Status: None   Collection Time: 02/03/21 10:05 AM   Specimen: Nasopharyngeal Swab  Result Value Ref Range Status   SARS Coronavirus 2 NEGATIVE NEGATIVE Final    Comment: (NOTE) SARS-CoV-2 target nucleic acids are NOT DETECTED.  The SARS-CoV-2 RNA is generally detectable in upper and  lower respiratory specimens during the acute phase of infection. Negative results do not preclude SARS-CoV-2 infection, do not rule out co-infections with other pathogens, and should not be used as the sole basis for treatment or other patient management decisions. Negative results must be combined with clinical observations, patient history, and epidemiological information. The expected result is Negative.  Fact Sheet for Patients: SugarRoll.be  Fact Sheet for Healthcare Providers: https://www.woods-mathews.com/  This test is not yet approved or cleared by the [place] FDA and  has been authorized for detection and/or diagnosis of SARS-CoV-2 by FDA under an Emergency Use Authorization (EUA). This EUA will remain  in effect (meaning this test can be used)  for the duration of the COVID-19 declaration under Se ction 564(b)(1) of the Act, 21 U.S.C. section 360bbb-3(b)(1), unless the authorization is terminated or revoked sooner.  Performed at [facility], [place] [address], [place], [place] [address]   Urine culture     Status: Abnormal   Collection Time: 02/05/21  4:08 PM   Specimen: Urine, Clean Catch  Result Value Ref Range Status   Specimen Description   Final    URINE, CLEAN CATCH Performed at [facility], [address] [place], [place] [address]    Special Requests   Final    NONE Performed at [facility], [address] [place], [place] [address]    Culture (A)  Final    <10,000 COLONIES/mL INSIGNIFICANT GROWTH Performed at [facility], [place] [address] [place], [place] [address]    Report Status 02/07/2021 FINAL  Final  SARS CORONAVIRUS 2 (TAT 6-24 HRS) Nasopharyngeal     Status: None   Collection Time: 02/07/21 12:01 PM   Specimen: Nasopharyngeal  Result Value Ref Range Status   SARS Coronavirus 2 NEGATIVE NEGATIVE Final    Comment: (NOTE) SARS-CoV-2 target nucleic acids are NOT  DETECTED.  The SARS-CoV-2 RNA is generally detectable in upper and lower respiratory specimens during the acute phase of infection. Negative results do not preclude SARS-CoV-2 infection, do not rule out co-infections with other pathogens, and should not be used as the sole basis for treatment or other patient management decisions. Negative results must be combined with clinical observations, patient history, and epidemiological information. The expected result is Negative.  Fact Sheet for Patients: SugarRoll.be  Fact Sheet for Healthcare Providers: https://www.woods-mathews.com/  This test is not yet approved or cleared by the [place] FDA and  has been authorized for detection and/or diagnosis of SARS-CoV-2 by FDA under an Emergency Use Authorization (EUA). This EUA will remain  in effect (meaning this test can be used) for the duration of the COVID-19 declaration under Se ction 564(b)(1) of the Act, 21 U.S.C. section 360bbb-3(b)(1), unless the authorization is terminated or revoked sooner.  Performed at [facility], [place] [address] [place], [place] [address]      Labs: BNP (last 3 results) No results for input(s): BNP in the last 8760 hours. Basic Metabolic Panel: Recent Labs  Lab 02/07/21 0926 02/07/21 1154 02/08/21 0448 02/09/21 0500 02/10/21 0320  NA 135 137 137 134* 133*  K 3.6 3.6 3.6 3.3* 4.0  CL 98 100 104 103 102  CO2 21* 21* 15* 19* 20*  GLUCOSE 73 74 62* 101* 96  BUN 36* 35* 34* 30* 27*  CREATININE 2.02* 1.91* 1.71* 1.59* 1.61*  CALCIUM 8.6* 8.1* 7.1* 6.9* 6.9*  MG  --   --  1.5* 2.3 2.2   Liver Function Tests: Recent Labs  Lab 02/05/21 1608 02/07/21 1154 02/08/21 1006  AST 18 25 43*  ALT _0 ALKPHOS 71 70 92  BILITOT 0.2* 0.8 0.8  PROT 6.7 5.9* 5.3*  ALBUMIN 2.7* 2.4* 2.0*   Recent Labs  Lab 02/07/21 1154  LIPASE 113*   No results for input(s): AMMONIA in the last 168  hours. CBC: Recent Labs  Lab 02/07/21 1154 02/08/21 0448 02/09/21 0500 02/09/21 1920 02/10/21 0320  WBC 10.3 10.7* 8.5 9.9 8.7  NEUTROABS 8.2* 8.2* 6.8 8.8* 7.5  HGB 9.0* 8.7* 7.9* 9.2* 8.7*  HCT 30.4* 29.7* 24.8* 30.2* 28.9*  MCV 101.0* 101.7* 98.4 98.1  98.0  PLT 275 275 231 239 246   Cardiac Enzymes: No results for input(s): CKTOTAL, CKMB, CKMBINDEX, TROPONINI in the last 168 hours. BNP: Invalid input(s): POCBNP CBG: Recent Labs  Lab 02/08/21 1200  GLUCAP 92   D-Dimer No results for input(s): DDIMER in the last 72 hours. Hgb A1c No results for input(s): HGBA1C in the last 72 hours. Lipid Profile No results for input(s): CHOL, HDL, LDLCALC, TRIG, CHOLHDL, LDLDIRECT in the last 72 hours. Thyroid function studies Recent Labs    02/08/21 0530  TSH 1.460   Anemia work up Recent Labs    02/08/21 0530  VITAMINB12 1,429*  FOLATE 58.1   Urinalysis    Component Value Date/Time   COLORURINE YELLOW 02/07/2021 1247   APPEARANCEUR CLEAR 02/07/2021 1247   LABSPEC 1.025 02/07/2021 1247   PHURINE 5.0 02/07/2021 1247   GLUCOSEU NEGATIVE 02/07/2021 1247   HGBUR SMALL (A) 02/07/2021 1247   BILIRUBINUR NEGATIVE 02/07/2021 1247   BILIRUBINUR NEG 01/10/2021 0922   KETONESUR 5 (A) 02/07/2021 1247   PROTEINUR 100 (A) 02/07/2021 1247   UROBILINOGEN 0.2 01/10/2021 0922   NITRITE NEGATIVE 02/07/2021 1247   LEUKOCYTESUR NEGATIVE 02/07/2021 1247   Sepsis Labs Invalid input(s): PROCALCITONIN,  WBC,  LACTICIDVEN Microbiology Recent Results (from the past 240 hour(s))  SARS CORONAVIRUS 2 (TAT 6-24 HRS) Nasopharyngeal Nasopharyngeal Swab     Status: None   Collection Time: 02/03/21 10:05 AM   Specimen: Nasopharyngeal Swab  Result Value Ref Range Status   SARS Coronavirus 2 NEGATIVE NEGATIVE Final    Comment: (NOTE) SARS-CoV-2 target nucleic acids are NOT DETECTED.  The SARS-CoV-2 RNA is generally detectable in upper and lower respiratory specimens during the acute phase of  infection. Negative results do not preclude SARS-CoV-2 infection, do not rule out co-infections with other pathogens, and should not be used as the sole basis for treatment or other patient management decisions. Negative results must be combined with clinical observations, patient history, and epidemiological information. The expected result is Negative.  Fact Sheet for Patients: SugarRoll.be  Fact Sheet for Healthcare Providers: https://www.woods-mathews.com/  This test is not yet approved or cleared by the [place] FDA and  has been authorized for detection and/or diagnosis of SARS-CoV-2 by FDA under an Emergency Use Authorization (EUA). This EUA will remain  in effect (meaning this test can be used) for the duration of the COVID-19 declaration under Se ction 564(b)(1) of the Act, 21 U.S.C. section 360bbb-3(b)(1), unless the authorization is terminated or revoked sooner.  Performed at [facility], [place] [address], [place], [place] [address]   Urine culture     Status: Abnormal   Collection Time: 02/05/21  4:08 PM   Specimen: Urine, Clean Catch  Result Value Ref Range Status   Specimen Description   Final    URINE, CLEAN CATCH Performed at [facility], [address] [place], [place] [address]    Special Requests   Final    NONE Performed at [facility], [address] [place], [place] [address]    Culture (A)  Final    <10,000 COLONIES/mL INSIGNIFICANT GROWTH Performed at [facility], [place] [address], [place], [place] [address]    Report Status 02/07/2021 FINAL  Final  SARS CORONAVIRUS 2 (TAT 6-24 HRS) Nasopharyngeal     Status: None   Collection Time: 02/07/21 12:01 PM   Specimen: Nasopharyngeal  Result Value Ref Range Status   SARS Coronavirus 2 NEGATIVE NEGATIVE  Final    Comment: (NOTE) SARS-CoV-2 target nucleic acids are NOT DETECTED.  The SARS-CoV-2 RNA is generally detectable  in upper and lower respiratory specimens during the acute phase of infection. Negative results do not preclude SARS-CoV-2 infection, do not rule out co-infections with other pathogens, and should not be used as the sole basis for treatment or other patient management decisions. Negative results must be combined with clinical observations, patient history, and epidemiological information. The expected result is Negative.  Fact Sheet for Patients: SugarRoll.be  Fact Sheet for Healthcare Providers: https://www.woods-mathews.com/  This test is not yet approved or cleared by the [place] FDA and  has been authorized for detection and/or diagnosis of SARS-CoV-2 by FDA under an Emergency Use Authorization (EUA). This EUA will remain  in effect (meaning this test can be used) for the duration of the COVID-19 declaration under Se ction 564(b)(1) of the Act, 21 U.S.C. section 360bbb-3(b)(1), unless the authorization is terminated or revoked sooner.  Performed at [facility], [place] [address] [place], [place] [address]      Time coordinating discharge: 40 minutes  SIGNED:   Elmarie Shiley, MD  [facility]

## 2021-02-10 NOTE — Progress Notes (Signed)
Patient produced stool Picc line out and transported home by husband.

## 2021-02-10 NOTE — Progress Notes (Signed)
Initial Nutrition Assessment  DOCUMENTATION CODES:   Not applicable  INTERVENTION:   -MVI with minerals daily -1 packet Juven BID, each packet provides 95 calories, 2.5 grams of protein (collagen), and 9.8 grams of carbohydrate (3 grams sugar); also contains 7 grams of L-arginine and L-glutamine, 300 mg vitamin C, 15 mg vitamin E, 1.2 mcg vitamin B-12, 9.5 mg zinc, 200 mg calcium, and 1.5 g  Calcium Beta-hydroxy-Beta-methylbutyrate to support wound healing -Ensure Max po daily, each supplement provides 150 kcal and 30 grams of protein.  -Magic cup TID with meals, each supplement provides 290 kcal and 9 grams of protein  NUTRITION DIAGNOSIS:   Increased nutrient needs related to wound healing,post-op healing as evidenced by estimated needs.  GOAL:   Patient will meet greater than or equal to 90% of their needs  MONITOR:   PO intake,Supplement acceptance,Labs,Weight trends,Skin,I & O's  REASON FOR ASSESSMENT:   Consult Assessment of nutrition requirement/status  ASSESSMENT:   Ms. Andon is a 70 yo female with extensive PMH including pulmonary HTN (followed by Dr. 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  Pt admitted with acute renal failure superimposed on stage 3 CKD and non-healing wound of rt lower extremity.   2/11- s/p PROCEDURE: 1.  Debridement of right leg wound totaling 8 x 12 cm.  This was a sharp excisional debridement including skin and subcutaneous tissue 2.  Application of Primatrix AG mesh totaling 8 x 12 cm  Reviewed I/O's: +822 ml x 24 hours and +1.4 L since admission  UOP: 800 ml x 24 hours  Pt unavailable at time of visit.   Per chart review, pt with chronic, poorly healing rt lower extremity wound, for which she is followed closely by wound center, ID, and plastics. Over the past week, pt has been experiencing both poor oral intake and generalized weakness.  Pt's poor wound healing is also very concerning to her husband, who is heavily involved in her care and performs daily dressing changes.   Pt's intake appears to have improved since admission. Noted meal completion 80-95%.   Reviewed wt hx; wt has been stable over the past year.   Pt with increased nutritional needs for wound healing and would greatly benefit from addition of oral nutrition supplements.    Labs reviewed: Na: 133.  Diet Order:   Diet Order            Diet Heart Room service appropriate? Yes; Fluid consistency: Thin  Diet effective now                 EDUCATION NEEDS:   No education needs have been identified at this time  Skin:  Skin Assessment: Skin Integrity Issues: Skin Integrity Issues:: Other (Comment),Incisions Incisions: rt leg Other: chronic venous stasis ulcer to lt pretibial  Last BM:  Unknown  Height:   Ht Readings from Last 1 Encounters:  02/05/21 _0  (1.727 m)    Weight:   Wt Readings from Last 1 Encounters:  02/10/21 57.9 kg    Ideal Body Weight:  63.6 kg  BMI:  Body mass index is 19.41 kg/m.  Estimated Nutritional Needs:   Kcal:  1800-2000  Protein:  100-115 grams  Fluid:  > 1.8 L    Loistine Chance, RD, LDN, [place] Registered Dietitian II Certified Diabetes Care and Education Specialist Please refer to [facility] for RD and/or RD on-call/weekend/after hours pager

## 2021-02-10 NOTE — Evaluation (Signed)
Occupational Therapy Evaluation Patient Details Name: [name] [name] MRN: [id number] DOB: 02-06-1951 Today's Date: 02/10/2021    History of Present Illness 70 yo female with extensive PMH including pulmonary HTN (followed by Dr. 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 Brought to ED  02/06/21 by her husband due to poor appetite and diarrhea, with increased weakness and mumbled speech. In ED found to have elevated creatinine and was placed on fluids. Admitted for acute renal failure on stage III chronic kidney disease, and non-healing wound on R LE. Pt s/p debridement of R LE wound and placement of primatrix mesh 02/09/21   Clinical Impression   Pt admitted with the above diagnoses and presents with below problem list. Pt will benefit from continued acute OT to address the below listed deficits and maximize independence with basic ADLs prior to d/c home with spouse providing 24 hr assist. At baseline, pt needs assist with basic ADLs. Assist provided by spouse at home. Pt currently mod-max A with LB ADLs and functional transfers.      Follow Up Recommendations  Home health OT;Supervision/Assistance - 24 hour    Equipment Recommendations  None recommended by OT    Recommendations for Other Services       Precautions / Restrictions Precautions Precautions: Fall Restrictions Weight Bearing Restrictions: No      Mobility Bed Mobility               General bed mobility comments: On BSC then recliner during session    Transfers Overall transfer level: Needs assistance   Transfers: Sit to/from Stand;Stand Pivot Transfers Sit to Stand: Mod assist Stand pivot transfers: Mod assist       General transfer comment: partial stand to pivot from [facility] to recliner. assist to steady and powerup    Balance Overall balance assessment: Needs assistance Sitting-balance support: Feet  supported;Bilateral upper extremity supported Sitting balance-Leahy Scale: Fair     Standing balance support: Bilateral upper extremity supported Standing balance-Leahy Scale: Poor Standing balance comment: requires UE support                           ADL either performed or assessed with clinical judgement   ADL Overall ADL's : Needs assistance/impaired Eating/Feeding: Set up;Sitting   Grooming: Moderate assistance;Sitting   Upper Body Bathing: Maximal assistance;Sitting   Lower Body Bathing: Maximal assistance;Sit to/from stand   Upper Body Dressing : Moderate assistance;Sitting   Lower Body Dressing: Maximal assistance;Sit to/from stand   Toilet Transfer: Moderate assistance;Stand-pivot;BSC   Toileting- Clothing Manipulation and Hygiene: Moderate assistance;Sitting/lateral lean Toileting - Clothing Manipulation Details (indicate cue type and reason): Pt able to lean into position sitting on BSC, total A for pericare task.       General ADL Comments: Pt received on BSC. Assist for pericare and pivoting back to recliner. Spouse present at start of session.     Vision Baseline Vision/History: Wears glasses       Perception     Praxis      Pertinent Vitals/Pain Pain Assessment: Faces Faces Pain Scale: Hurts little more Pain Location: generalized with movement Pain Descriptors / Indicators: Grimacing;Guarding Pain Intervention(s): Limited activity within patient's tolerance;Monitored during session;Repositioned     Hand Dominance Left   Extremity/Trunk Assessment Upper Extremity Assessment Upper Extremity Assessment: Generalized weakness (baseline joint deformities of B digits)   Lower Extremity Assessment Lower Extremity Assessment: Defer to PT evaluation  Cervical / Trunk Assessment Cervical / Trunk Assessment: Kyphotic   Communication Communication Communication: No difficulties   Cognition Arousal/Alertness: Awake/alert Behavior During  Therapy: WFL for tasks assessed/performed;Flat affect Overall Cognitive Status: Within Functional Limits for tasks assessed                                     General Comments       Exercises     Shoulder Instructions      Home Living Family/patient expects to be discharged to:: Private residence Living Arrangements: Spouse/significant other Available Help at Discharge: Family;Available 24 hours/day Type of Home: House Home Access: Stairs to enter;Ramped entrance Entrance Stairs-Number of Steps: 2 Entrance Stairs-Rails: Can reach both Home Layout: Two level;Able to live on main level with bedroom/bathroom     Bathroom Shower/Tub: Occupational psychologist: Handicapped height     Home Equipment: Environmental consultant - 2 wheels;Transport chair          Prior Functioning/Environment Level of Independence: Needs assistance  Gait / Transfers Assistance Needed: last month husband pivots to transfport chair and BSC, prior to that able to ambulate household distance ADL's / Homemaking Assistance Needed: husband assists with bathing and dressing and all iADLs   Comments: independent prior to pelvic fx a year ago        OT Problem List: Decreased strength;Decreased activity tolerance;Impaired balance (sitting and/or standing);Decreased knowledge of use of DME or AE;Decreased knowledge of precautions;Pain      OT Treatment/Interventions: Self-care/ADL training;Therapeutic exercise;Energy conservation;DME and/or AE instruction;Therapeutic activities;Patient/family education;Balance training    OT Goals(Current goals can be found in the care plan section) Acute Rehab OT Goals Patient Stated Goal: go home today OT Goal Formulation: With patient/family Time For Goal Achievement: 02/24/21 Potential to Achieve Goals: Good ADL Goals Pt Will Perform Upper Body Dressing: with min assist;sitting Pt Will Perform Lower Body Dressing: with mod assist;sitting/lateral leans Pt  Will Transfer to Toilet: with min assist;stand pivot transfer;squat pivot transfer;bedside commode Pt Will Perform Toileting - Clothing Manipulation and hygiene: with mod assist;sitting/lateral leans  OT Frequency: Min 2X/week   Barriers to D/C:            Co-evaluation              AM-PAC OT "6 Clicks" Daily Activity     Outcome Measure Help from another person eating meals?: A Little Help from another person taking care of personal grooming?: A Little Help from another person toileting, which includes using toliet, bedpan, or urinal?: A Lot Help from another person bathing (including washing, rinsing, drying)?: A Lot Help from another person to put on and taking off regular upper body clothing?: A Lot Help from another person to put on and taking off regular lower body clothing?: A Lot 6 Click Score: 14   End of Session    Activity Tolerance: Patient tolerated treatment well Patient left: in chair;with call bell/phone within reach  OT Visit Diagnosis: Unsteadiness on feet (R26.81);Muscle weakness (generalized) (M62.81);Pain;Other abnormalities of gait and mobility (R26.89)                Time: 4174-0814 OT Time Calculation (min): 13 min Charges:  OT General Charges $OT Visit: 1 Visit OT Evaluation $OT Eval Low Complexity: [employer], OT Acute Rehabilitation Services Pager: [phone] Office: [phone]   Hortencia Pilar 02/10/2021, 2:29 PM

## 2021-02-10 NOTE — Plan of Care (Signed)
  Problem: Education: Goal: Ability to demonstrate management of disease process will improve Outcome: Progressing Goal: Ability to verbalize understanding of medication therapies will improve Outcome: Progressing   Problem: Activity: Goal: Capacity to carry out activities will improve Outcome: Progressing   

## 2021-02-12 DIAGNOSIS — I87331 Chronic venous hypertension (idiopathic) with ulcer and inflammation of right lower extremity: Secondary | ICD-10-CM | POA: Diagnosis not present

## 2021-02-12 DIAGNOSIS — B965 Pseudomonas (aeruginosa) (mallei) (pseudomallei) as the cause of diseases classified elsewhere: Secondary | ICD-10-CM | POA: Diagnosis not present

## 2021-02-12 DIAGNOSIS — B952 Enterococcus as the cause of diseases classified elsewhere: Secondary | ICD-10-CM | POA: Diagnosis not present

## 2021-02-12 DIAGNOSIS — L03115 Cellulitis of right lower limb: Secondary | ICD-10-CM | POA: Diagnosis not present

## 2021-02-12 DIAGNOSIS — L97812 Non-pressure chronic ulcer of other part of right lower leg with fat layer exposed: Secondary | ICD-10-CM | POA: Diagnosis not present

## 2021-02-12 DIAGNOSIS — M3481 Systemic sclerosis with lung involvement: Secondary | ICD-10-CM | POA: Diagnosis not present

## 2021-02-13 ENCOUNTER — Encounter (HOSPITAL_BASED_OUTPATIENT_CLINIC_OR_DEPARTMENT_OTHER): Payer: Medicare Other | Admitting: Internal Medicine

## 2021-02-13 ENCOUNTER — Telehealth: Payer: Self-pay

## 2021-02-13 ENCOUNTER — Encounter: Payer: Self-pay | Admitting: Internal Medicine

## 2021-02-13 ENCOUNTER — Telehealth: Payer: Self-pay | Admitting: Internal Medicine

## 2021-02-13 ENCOUNTER — Telehealth: Payer: Self-pay | Admitting: Surgical

## 2021-02-13 DIAGNOSIS — B952 Enterococcus as the cause of diseases classified elsewhere: Secondary | ICD-10-CM | POA: Diagnosis not present

## 2021-02-13 DIAGNOSIS — M3481 Systemic sclerosis with lung involvement: Secondary | ICD-10-CM | POA: Diagnosis not present

## 2021-02-13 DIAGNOSIS — B965 Pseudomonas (aeruginosa) (mallei) (pseudomallei) as the cause of diseases classified elsewhere: Secondary | ICD-10-CM | POA: Diagnosis not present

## 2021-02-13 DIAGNOSIS — L03115 Cellulitis of right lower limb: Secondary | ICD-10-CM | POA: Diagnosis not present

## 2021-02-13 DIAGNOSIS — L97812 Non-pressure chronic ulcer of other part of right lower leg with fat layer exposed: Secondary | ICD-10-CM | POA: Diagnosis not present

## 2021-02-13 DIAGNOSIS — I87331 Chronic venous hypertension (idiopathic) with ulcer and inflammation of right lower extremity: Secondary | ICD-10-CM | POA: Diagnosis not present

## 2021-02-13 MED ORDER — FLUCONAZOLE 150 MG PO TABS: 150.0000 mg | ORAL_TABLET | Freq: Once | ORAL | 0 refills | Status: AC

## 2021-02-13 NOTE — Telephone Encounter (Signed)
Go the QT interval prolongation message when prescribing diflucan.

## 2021-02-13 NOTE — Telephone Encounter (Signed)
[name] [name] [phone]  [name] called to say she has thrush from the antibiotics that she was put on from her surgery they done on Thursday and would like for you to give her something for it.

## 2021-02-13 NOTE — Telephone Encounter (Signed)
Send in Diflucan 150 mg tablet.

## 2021-02-13 NOTE — Telephone Encounter (Signed)
Called [name], patient's husband to discuss wound care instructions and follow-up instructions.  He called the surgical scheduling staff with questions about wound care instructions.  I discussed the patient that at this point I would recommend daily or every other day dressing changes based on soil level/drainage.  I discussed with him the wound care protocol which would include unwrapping the current dressings, covering the Mepitel dressing with K-Y jelly, covering with 4 x 4 gauze, ABD pad, Kerlix, Ace wrap.  He reported that he does not have any K-Y jelly at home, I discussed with him that for today he can change without K-Y jelly.  I also called home health RN Kingsley Plan from [facility] to discuss wound care protocol with her.  I discussed with her that if she could go out today that would be great, however tomorrow will be fine as well.  She reports that she has a very full schedule today and she will try to make it today, however it will likely be tomorrow.  I discussed with her the wound care instructions which would involve removing current dressing, leaving Mepitel and wound matrix in place.  Then lightly covering with K-Y jelly, Profore gauze, Kerlix, Ace wrap.  This can be changed daily or every other day depending on soil level.  She is understanding of this and was able to take a verbal order.  I appreciate their help.  I then called Mr. Smyre back and let him know that home health will try to come out today, however they would likely come tomorrow and he seemed comfortable with changing her dressing today.  I also discussed with him that we would schedule follow-up with our office in the next 1 to 2 weeks to evaluate her wound.

## 2021-02-13 NOTE — Telephone Encounter (Signed)
Transition Care Management Follow-up Telephone Call  Date of discharge and from where: 02/10/21 from [facility]  How have you been since you were released from the hospital? Pt stated that she is doing well and has some soreness.   Any questions or concerns? No  Items Reviewed:  Did the pt receive and understand the discharge instructions provided? Yes   Medications obtained and verified? Yes   Other? No   Any new allergies since your discharge? No   Dietary orders reviewed? Heart Health  Do you have support at home? Yes   Functional Questionnaire: (I = Independent and D = Dependent) ADLs: I  Bathing/Dressing- I  Meal Prep- I  Eating- I  Maintaining continence- I  Transferring/Ambulation- D  Managing Meds- I  Follow up appointments reviewed:   PCP Hospital f/u appt confirmed? Yes  Scheduled to see [name], MD on 02/17/21 @ 12:00pm.  [facility] f/u appt confirmed? Yes  Scheduled to see Linton Ham, MD on 02/20/21 @ 11:00am.  Are transportation arrangements needed? No   If their condition worsens, is the pt aware to call PCP or go to the Emergency Dept.? Yes  Was the patient provided with contact information for the PCP's office or ED? Yes  Was to pt encouraged to call back with questions or concerns? Yes

## 2021-02-13 NOTE — Telephone Encounter (Signed)
Transition Care Management Follow-up Telephone Call Date of discharge and from where: 02/10/21 [facility]    How have you been since you were released from the hospital? Leg pain from surgery, she said she is okay hopes she was better.   Any questions or concerns? No  Items Reviewed:  Did the pt receive and understand the discharge instructions provided? Yes   Medications obtained and verified? Yes  CARTIA 180MG daily  Other? No   Any new allergies since your discharge? No   Dietary orders reviewed? Yes  Do you have support at home? Yes , nurse comes to change bandage on foot every other day. PT and OC.  Home Care and Equipment/Supplies: Were home health services ordered? yes If so, what is the name of the agency? [facility]   Has the agency set up a time to come to the patient's home? yes Were any new equipment or medical supplies ordered?  No What is the name of the medical supply agency? N/A Were you able to get the supplies/equipment? no Do you have any questions related to the use of the equipment or supplies?  N/A   Functional Questionnaire: (I = Independent and D = Dependent) ADLs: D  Bathing/Dressing- I  Meal Prep- D  Eating- I  Maintaining continence- I  Transferring/Ambulation- D  Managing Meds- Husband   Follow up appointments reviewed:   PCP Hospital f/u appt confirmed? Yes  Scheduled to see Dr. Renold Genta on 02/17/21 @ 12:00pm.  [facility] f/u appt confirmed? Yes  Scheduled to see Dr. Dellia Nims wound care on 02/20/21 @ 11:00am.  Are transportation arrangements needed? Yes   If their condition worsens, is the pt aware to call PCP or go to the Emergency Dept.? Yes  Was the patient provided with contact information for the PCP's office or ED? Yes  Was to pt encouraged to call back with questions or concerns? Yes

## 2021-02-13 NOTE — Telephone Encounter (Signed)
Sending in [place]

## 2021-02-13 NOTE — Telephone Encounter (Signed)
[name] [name], OT for [facility], called to say that he is with the patient, her bandage is green tinged and he is concerned about possible infection.  Timmothy Sours said it's his understanding in talking with patient's husband, that the green color was coming out prior to surgery and they scraped it.  Patient's husband said that two days ago it was white and it turned green last night.  [name] [name] requested that we please send orders sent to them and he gave me his fax number [phone]).  Please call.

## 2021-02-14 ENCOUNTER — Ambulatory Visit (INDEPENDENT_AMBULATORY_CARE_PROVIDER_SITE_OTHER): Payer: Medicare Other | Admitting: Plastic Surgery

## 2021-02-14 ENCOUNTER — Telehealth: Payer: Self-pay

## 2021-02-14 ENCOUNTER — Encounter: Payer: Self-pay | Admitting: Plastic Surgery

## 2021-02-14 ENCOUNTER — Other Ambulatory Visit: Payer: Self-pay

## 2021-02-14 VITALS — BP 148/74 | HR 78

## 2021-02-14 DIAGNOSIS — S81801A Unspecified open wound, right lower leg, initial encounter: Secondary | ICD-10-CM

## 2021-02-14 DIAGNOSIS — L97812 Non-pressure chronic ulcer of other part of right lower leg with fat layer exposed: Secondary | ICD-10-CM | POA: Diagnosis not present

## 2021-02-14 DIAGNOSIS — I87331 Chronic venous hypertension (idiopathic) with ulcer and inflammation of right lower extremity: Secondary | ICD-10-CM | POA: Diagnosis not present

## 2021-02-14 DIAGNOSIS — L03115 Cellulitis of right lower limb: Secondary | ICD-10-CM | POA: Diagnosis not present

## 2021-02-14 DIAGNOSIS — M3481 Systemic sclerosis with lung involvement: Secondary | ICD-10-CM | POA: Diagnosis not present

## 2021-02-14 DIAGNOSIS — B952 Enterococcus as the cause of diseases classified elsewhere: Secondary | ICD-10-CM | POA: Diagnosis not present

## 2021-02-14 DIAGNOSIS — B965 Pseudomonas (aeruginosa) (mallei) (pseudomallei) as the cause of diseases classified elsewhere: Secondary | ICD-10-CM | POA: Diagnosis not present

## 2021-02-14 MED ORDER — HYDROCODONE-ACETAMINOPHEN 5-325 MG PO TABS: 1.0000 | ORAL_TABLET | ORAL | 0 refills | Status: AC | PRN

## 2021-02-14 NOTE — Telephone Encounter (Signed)
Patient was seen in the office today.//AB/CMA

## 2021-02-14 NOTE — Progress Notes (Signed)
Subjective:     [name], female    DOB: 1951-05-17, 70 y.o.   MRN: [id number]  Chief Complaint  Patient presents with  . Follow-up    HPI: The patient is a 70 y.o. female here for follow-up after undergoing debridement of right leg wound totaling 8 x 12 cm.  And application of primatrix AG mesh on 02/09/21 with Dr. [name].  Patient presents today with her husband because home health was concerned about drainage and greenish color.  They reports she had previous known infection with " green bacteria".  Patient denies fever/chills, nausea/vomiting.  Reports no current pain but at times she said pain can be quite high of the wound.  Reports the wound care center takes care of the wound on her left leg.  Review of Systems  Constitutional: Negative for chills and fever.  Respiratory: Negative for shortness of breath.   Cardiovascular: Negative for chest pain and leg swelling.  Gastrointestinal: Negative for constipation, diarrhea, nausea and vomiting.  Skin: Positive for color change (Green drainage) and wound (Bilateral leg wounds). Negative for pallor and rash.     Objective:   Vital Signs BP (!) 148/74 (BP Location: Left Arm, Patient Position: Sitting, Cuff Size: Normal)   Pulse 78   SpO2 93% Comment: With 3 liters of O2 Vital Signs and Nursing Note Reviewed  Physical Exam Constitutional:      General: She is not in acute distress.    Appearance: Normal appearance. She is ill-appearing.  HENT:     Head: Normocephalic and atraumatic.  Eyes:     Extraocular Movements: Extraocular movements intact and EOM normal.  Cardiovascular:     Rate and Rhythm: Normal rate.  Pulmonary:     Comments: Nasal cannula with 3 L of oxygen Musculoskeletal:     Cervical back: Normal range of motion.     Comments: Right leg wound with Mepitel mesh in place.  There is a bright green color to the mesh.  When mesh is removed green color remains on the mesh mostly.  Edema present.   Chronic erythema present.  Yellowish drainage on the gauze.  Skin:    General: Skin is warm and dry.     Coloration: Skin is not pale.     Findings: No erythema or rash.  Neurological:     Mental Status: She is alert and oriented to person, place, and time.     Gait: Gait is intact.  Psychiatric:        Mood and Affect: Mood and affect normal.        Behavior: Behavior normal.        Thought Content: Thought content normal.        Cognition and Memory: Memory normal.        Judgment: Judgment normal.        Pictures were obtained of the patient and placed in the chart with the patient's or guardian's permission.      Assessment/Plan:     ICD-10-CM   1. Non-healing wound of right lower extremity  S81.801A    [name] presents today with her husband with concerns for her right lower leg wound.  This wound was debrided and primatrix AG was placed approximately 5 days ago.  Dr. [name] was available for exam today.  Greenish hue to wound comes off when Mepitel mesh was removed.  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  Patient reports GI issues with previous antibiotic  use.  Appearance and drainage of wound is as expected.  Recommend continuing daily or every other day dressing changes consisting of K-Y jelly applied to Mepitel Mast/Adaptic, 4 x 4 gauze, ABDs, wrapped with Kerlix and Ace wrap.  Keep follow-up appointment for next week.  Patient requested pain medication for nighttime.  Norco sent to pharmacy.  Call office with any questions/concerns.  Threasa Heads, PA-C 02/14/2021, 4:33 PM

## 2021-02-14 NOTE — Telephone Encounter (Signed)
[name] [name] called from [name] [name] to say that she is with the patient.  She said they called yesterday evening regarding having concern about infection on the patient's grafted leg (left leg).  [name] [name] said that the patient has a lot of green drainage coming out and it looks like it's infected.  **Patient now has an appointment with [place] today at 3:40pm.**

## 2021-02-14 NOTE — Telephone Encounter (Signed)
Spoke with Shirlean Kelly with [facility] on (02/13/21).  Regarding the message below.  Informed Mr. [name] [name] that [facility] spoke with the patient's husband, regarding the patient's wound care instructions, and Sharradha,RN with [facility] to discuss wound care protocol with the patient, and wound care instructions.    Also informed Mr. [name] [name] that [facility] discussed with [name] [name] that if she could go out today that would be great, however tomorrow will be fine as well.  [name] [name] reported to [facility] that she has a very full schedule today and she will try to make it today, however it will likely be tomorrow.    Mr. [name] [name] verbalized understanding and stated that he wanted to make sure someone was aware of the bandage been green.  Informed Mr. Wynetta Emery he can give Sharradha,RN call and let her know what he sees.  He verbalized understanding and agreed and stated okay.//AB/CMA

## 2021-02-15 ENCOUNTER — Encounter: Payer: Medicare Other | Admitting: Plastic Surgery

## 2021-02-16 ENCOUNTER — Encounter: Payer: Self-pay | Admitting: Internal Medicine

## 2021-02-16 ENCOUNTER — Telehealth: Payer: Self-pay | Admitting: Internal Medicine

## 2021-02-16 DIAGNOSIS — K121 Other forms of stomatitis: Secondary | ICD-10-CM

## 2021-02-16 MED ORDER — FLUCONAZOLE 150 MG PO TABS: 150.0000 mg | ORAL_TABLET | Freq: Once | ORAL | 1 refills | Status: AC

## 2021-02-16 NOTE — Telephone Encounter (Addendum)
I called [name] [name] to move appointment to next week because of weather and she was good with that. She said her mouth was raw because of the antibiotics, she stated you have given her a pill in the past that has helped, she was hoping she might could get that.   Sending in one dose of Diflucan 150 mg tablet with one refill

## 2021-02-17 ENCOUNTER — Inpatient Hospital Stay: Payer: Medicare Other | Admitting: Internal Medicine

## 2021-02-20 ENCOUNTER — Ambulatory Visit (INDEPENDENT_AMBULATORY_CARE_PROVIDER_SITE_OTHER): Payer: Medicare Other | Admitting: Internal Medicine

## 2021-02-20 ENCOUNTER — Encounter (HOSPITAL_BASED_OUTPATIENT_CLINIC_OR_DEPARTMENT_OTHER): Payer: Medicare Other | Attending: Internal Medicine | Admitting: Internal Medicine

## 2021-02-20 ENCOUNTER — Telehealth: Payer: Self-pay

## 2021-02-20 ENCOUNTER — Other Ambulatory Visit: Payer: Self-pay

## 2021-02-20 ENCOUNTER — Encounter: Payer: Self-pay | Admitting: Internal Medicine

## 2021-02-20 VITALS — BP 122/59 | HR 70 | Temp 97.8°F

## 2021-02-20 DIAGNOSIS — T148XXA Other injury of unspecified body region, initial encounter: Secondary | ICD-10-CM | POA: Diagnosis not present

## 2021-02-20 DIAGNOSIS — S81801A Unspecified open wound, right lower leg, initial encounter: Secondary | ICD-10-CM | POA: Diagnosis not present

## 2021-02-20 DIAGNOSIS — L89616 Pressure-induced deep tissue damage of right heel: Secondary | ICD-10-CM | POA: Insufficient documentation

## 2021-02-20 DIAGNOSIS — Z2239 Carrier of other specified bacterial diseases: Secondary | ICD-10-CM | POA: Diagnosis not present

## 2021-02-20 DIAGNOSIS — L97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: Secondary | ICD-10-CM | POA: Insufficient documentation

## 2021-02-20 DIAGNOSIS — L97811 Non-pressure chronic ulcer of other part of right lower leg limited to breakdown of skin: Secondary | ICD-10-CM | POA: Diagnosis not present

## 2021-02-20 DIAGNOSIS — I890 Lymphedema, not elsewhere classified: Secondary | ICD-10-CM | POA: Diagnosis not present

## 2021-02-20 DIAGNOSIS — L97522 Non-pressure chronic ulcer of other part of left foot with fat layer exposed: Secondary | ICD-10-CM | POA: Diagnosis not present

## 2021-02-20 DIAGNOSIS — L97528 Non-pressure chronic ulcer of other part of left foot with other specified severity: Secondary | ICD-10-CM | POA: Insufficient documentation

## 2021-02-20 DIAGNOSIS — Z86718 Personal history of other venous thrombosis and embolism: Secondary | ICD-10-CM | POA: Diagnosis not present

## 2021-02-20 DIAGNOSIS — L97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: Secondary | ICD-10-CM | POA: Insufficient documentation

## 2021-02-20 DIAGNOSIS — L97513 Non-pressure chronic ulcer of other part of right foot with necrosis of muscle: Secondary | ICD-10-CM | POA: Insufficient documentation

## 2021-02-20 DIAGNOSIS — M341 CR(E)ST syndrome: Secondary | ICD-10-CM | POA: Insufficient documentation

## 2021-02-20 DIAGNOSIS — Z7902 Long term (current) use of antithrombotics/antiplatelets: Secondary | ICD-10-CM | POA: Diagnosis not present

## 2021-02-20 DIAGNOSIS — M069 Rheumatoid arthritis, unspecified: Secondary | ICD-10-CM | POA: Diagnosis not present

## 2021-02-20 DIAGNOSIS — L97512 Non-pressure chronic ulcer of other part of right foot with fat layer exposed: Secondary | ICD-10-CM | POA: Diagnosis not present

## 2021-02-20 DIAGNOSIS — L97523 Non-pressure chronic ulcer of other part of left foot with necrosis of muscle: Secondary | ICD-10-CM | POA: Insufficient documentation

## 2021-02-20 DIAGNOSIS — Z7901 Long term (current) use of anticoagulants: Secondary | ICD-10-CM | POA: Diagnosis not present

## 2021-02-20 DIAGNOSIS — D849 Immunodeficiency, unspecified: Secondary | ICD-10-CM

## 2021-02-20 DIAGNOSIS — Z9981 Dependence on supplemental oxygen: Secondary | ICD-10-CM | POA: Insufficient documentation

## 2021-02-20 DIAGNOSIS — L97322 Non-pressure chronic ulcer of left ankle with fat layer exposed: Secondary | ICD-10-CM | POA: Diagnosis not present

## 2021-02-20 NOTE — Telephone Encounter (Signed)
Verbal orders have been given to [facility] with Alvis Lemmings for OT. I have also advised Timmothy Sours that future OT/PT orders should go to the patients pcp.

## 2021-02-20 NOTE — Progress Notes (Addendum)
[facility]  Reason for Consult:chronic wound infection Referring Provider: wound center [facility]    Patient Active Problem List   Diagnosis Date Noted  . Open wound   . PAF (paroxysmal atrial fibrillation) ([place])   . AKI (acute kidney injury) ([place]) 02/07/2021  . Macrocytic anemia 02/07/2021  . Thrombocytosis 02/07/2021  . PAH (pulmonary artery hypertension) ([place]) 02/07/2021  . Chronic diastolic CHF (congestive heart failure) ([place]) 02/07/2021  . Non-healing wound of right lower extremity 02/07/2021  . Physical deconditioning 02/07/2021  . CREST syndrome ([place])   . Iron deficiency anemia due to chronic blood loss 01/26/2021  . Anemia due to chronic blood loss 01/18/2021  . Anxiety state 01/18/2021  . 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 Disorder of calcium metabolism 01/18/2021  . Gastro-esophageal reflux disease with esophagitis, without bleeding 01/18/2021  . Oxygen dependent 01/18/2021  . Ulcer of esophagus with bleeding 01/18/2021  . Gangrene of finger of right hand ([place]) 11/29/2020  . Malnutrition of moderate degree 11/22/2020  . GI bleed 11/21/2020  . Eustachian tube dysfunction, bilateral 07/29/2020  . 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 Wrist fracture 11/24/2019  . Multiple closed stable fractures of pubic ramus ([place]) 11/23/2019  . Distal radius fracture, left 11/23/2019  . Fall at home, initial encounter 11/23/2019  . AMS (altered mental status) 11/12/2019  . Altered mental status 11/11/2019  . Hypomagnesemia 11/11/2019  . Osteoporosis 01/22/2019  . Hypothyroidism 06/16/2018  . Pulmonary hypertension associated with systemic disorder ([place]) 10/08/2017  . 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 Dry gangrene ([place]) 09/11/2017  . Medication reaction 09/11/2017  . ILD (interstitial lung disease) ([place]) 07/22/2017  . Nasal septal perforation 07/10/2017  . Chronic respiratory failure  with hypoxia ([place]) 10/31/2016  . Atherosclerosis of native arteries of right leg with ulceration of unspecified site ([place]) 09/14/2016  . Prolonged Q-T interval on ECG 07/29/2016  . CKD (chronic kidney disease), stage III ([place]) 07/29/2016  . Scleroderma ([place]) 05/06/2016  . Depression 04/24/2016  . Extensor tenosynovitis of wrist, right 02/24/2016  . 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 Hypokalemia 10/07/2015  . 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[place]) 09/01/2015  . Allergic rhinitis 03/08/2015  . High risk medication use 04/09/2013  . Venous hypertension, chronic, with ulcer and inflammation ([place]) 01/29/2013  . Lymphoma, non-Hodgkin's ([place]) 01/27/2013  . Right heart failure ([place]) 01/27/2013  . Varicose veins of lower extremities with ulcer and inflammation ([place]) 07/24/2012  . 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[place]) 04/08/2012  . Pulmonary hypertension associated with systemic disorder ([place]) 03/25/2012  . Peripheral arterial occlusive disease ([place]) 08/10/2011  . Essential hypertension 08/10/2011  . Hyperlipidemia 08/10/2011  . 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 female  PMH crest, pulm htn, venous stasis, hx dvt s/p pvc filter (doac stopped 10/2020 due to gib), pvd referred by wound center for infected chronic open wound started on cefepime, admitted 2/08 for dehydration/aki  02/20/21 id f/u Patient is here for f/u of recent hospital admission for abx related n/v/diarrhea and aki She had just more than 1 week cefepime without really any change in her chronic wound (green discharge, moderate pain (chronic) and swelling. She had no fever chill  She underwent debridement on 2/10 of the right LE large open wound  8Y64 cm with application of primatrix AG mesh of same size; by plastic surgery dr [name] [name]. No cultures were sent. I saw her in the hospital and  advise no abx at that time She has recent f/u with plastic surgery team -- "greenish discharge on mesh... 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 Seen by wound clinic Complicated by pulm htn/hypoxemic resp failure, venous stasis, crest syndrome Previous superficial cx mrsa in 2016 There is a biopsy of the wound 09/2015 "lipodermatosclerosis, without malignancy" Wound progressively worse in terms of size/depth by summer 2017 despite aggressive conservative care  08/2016 angiogram with right femoral endarterectomy/dacron patchy angioplasty Wound care had deemed patient "not candidate for skin grafting due to scleroderma/venous hypertension." 09/2020 repeated angioplasty right anterior tibial artery and tibioperoneal trunk and right superficial femoral artery. subsequewntly angioplasty of left anterior tibial artery and left tibial peroneal trunk 10/2020 acute right LE dvt. S/p ivc filter due to gib; xarelto stopped late 10/2020. 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 So referred to ID. She had been started on cefdinir. Compression dressing also applied but she had developed some ischemic distal changes. The pain/redness have been better.   She has plastic surgery outpatient f/u  I started her on iv cefepime to keep what seems to be ssi at [place] awaiting  plastic surgery. I did discuss with her the poor prgnosis and the substantial probability that the abx might not do anything. I also express concern surgery (debridement) might not help given her crest syndrome/pvd  She developed several days diarrhea and subsequently was fatigued. The diarrhea seems to be improving the past few days. Denies f/c. The wound appears the same, not worse or better   On admission, she was afebrile, mild wbc elevation at 11 ucx 2/06 <10k  Insignificant growth Cr 1.2 --> 2.0 (baseline 1.03); lft wnl Due to fatigue and slow to response, an mri brain was obtained and showed no acute cva cxr no acute cardiopulm process   -------- Hx abx allergy: Penicillin allergy -- 34 years ago. Rash on face. She takes cephalexin and other cephalosporin products fine. havent tried amoxicillin or augmentin yet.  levaquin -- face swelling years ago early 2000s. Patient had ciprofloxacin after and she did ok with that   Soc Hx: Lives with husband She never smokes   Meds: Sildenafil Prednisone 5 mg a day (current dose for 9 years) No other immunosuppressants  Review of Systems: ROS Other ros negative      Past Medical History:  Diagnosis Date  . Anemia   . Arthritis   . DOE (dyspnea on exertion)   . Epistaxis   . History of chicken pox   . Hypertension   . Hypothyroidism   . Leg ulcer ([place])   . Melanoma ([place]) 1999  . 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[place])   . Non Hodgkin's lymphoma ([place])    Treated with chemo 2010, felt to be cured.  Marland Kitchen  PAH (pulmonary artery hypertension) ([place])   . Peripheral arterial occlusive disease ([place])   . Pneumonia 2008  . Pulmonary hypertension ([place])   . Requires supplemental oxygen    2 liters-3liters when moving  . 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 Smoking status: Never Smoker  . Smokeless tobacco: Never Used  Vaping Use  . Vaping Use: Never used  Substance Use Topics  . Alcohol use:  Yes    Alcohol/week: 7.0 standard drinks    Types: 3 Standard drinks or equivalent, 4 Glasses of wine per week  . Drug use: No    Family History  Problem Relation Age of Onset  . Lupus Father   . Lymphoma Father   . Hyperlipidemia Mother   . Cancer Daughter        sarcoma  . Colon cancer Other     Allergies  Allergen Reactions  . Levofloxacin Other (See Comments)    FLU LIKE SYMPTOM   . Sulfa Antibiotics Rash and Other (See Comments)    Face peeled, Severe rash with significant peeling of face. No airway involvement per patient   . Doxycycline Hyclate Swelling    SWELLING REACTION UNSPECIFIED ; "TABLETS ONLY - CAPSULES ARE TOLERATED FINE"  . Penicillins Rash    Has patient had a PCN reaction causing immediate rash, facial/tongue/throat swelling, SOB or lightheadedness with hypotension: no Has patient had a PCN reaction causing severe rash involving mucus membranes or skin necrosis: no Has patient had a PCN reaction that required hospitalization: no Has patient had a PCN reaction occurring within the last 10 years: {no If all of the above answers are "NO", then may proceed with Cephalosporin use.    OBJECTIVE: There were no vitals filed for this visit. There is no height or weight on file to calculate BMI.   Physical Exam Here with husband, no distress, conversant, cooperative Heent: atraumatic; per; conj clear Neck supple cv rrr no mrg Lungs clear; on 2 liters oxygen supplement abd s/nt Ext mild edema on RLE; left LE in coban dressing Msk: s/p partial several fingers ampuation in the past.  Neuro nonfocal Psych alert/oriented  Lab: 2/11 cr 1.6; cbc 9/9/246 2/08 cr 2.02 1/11 cr 1.25; lft wnl; cbc 9/8.6/473  Crp: 01/26/21      2.6 (<1) 01/24/21    35.1 (<8)  Microbiology: 12/20/20 superficial wound cx ?right LE Pseudomonas aearuginosa (pan sensitive) E faecalis  Serology:  Imaging:   Assessment/plan: 70 y.o.femalePMHcrest, pulm htn, venous  stasis,hx dvt s/p pvc filter (doac stopped 10/2020 due to gib),pvd referred by wound center for infected chronic open woundstarted on cefepime, admitted 2/08 for dehydration/aki  Despite cefepime, her wound remains the same. Her current clinical picture is an iatrogenic result of cefepime/abx leading to dehydration/aki. Mentation slow too due to dehydration/aki, and potentially cefepime cns toxicity  Given lack of response of the wound to cefepime (as expected), unless she develop frank sepsis with clinical evidence worsening cellulitis/ssi, would avoid further abx for now  She has no evidence of other localizing infectious syndrome  ----- 2/10 assessment Strength/aki/appetite improved since admission. Remains off abx Ext wound stable and appears no active infection at this time  Tough case and poor prognosis for wound healing  2/21 id clinic assessment Clinically well appearing again. Not able to assess wound but I reviewed picture of wound from the last outpatient clinic visit with plastic surgery. It appears similar to before/during cefepime treatment. Clinically no evidence  sepsis.  I agree with plastic surgery she has chronic pseudomonas colonization. Advise against topical or systemic abx at this time  -Prognosis discussed again. Guarded/poor. Would continue supportive care. Maggot therapy is discussed -baseline labs crp -f/u as needed  I spend 20 minutes reviewing data/chart and >50% time face to face counseling/discussing treatment with patient     Follow-up: Return if symptoms worsen or fail to improve.  Jabier Mutton, [facility] [place] -- -- pager   [phone] cell 02/20/2021, 2:12 PM

## 2021-02-20 NOTE — Telephone Encounter (Signed)
Don with [facility] [phone]) is calling requesting verbal orders for OT for the patient. OT for ADL transfers, exercises, non pharmacological for pain control and ulcer prevention.  Once a week for 2 weeks 0 times a week for 1 week Once a week for 3 weeks  If orders are ok I will call Timmothy Sours and give verbal orders.

## 2021-02-20 NOTE — Addendum Note (Signed)
Addended byMeriel Pica F on: 02/20/2021 03:02 PM   Modules accepted: Orders

## 2021-02-20 NOTE — Progress Notes (Signed)
[name] [name], [name] [name] ([id number]) Visit Report for 02/20/2021 HPI Details Patient Name: Date of Service: [name] 02/20/2021 11:00 A M Medical Record Number: [id number] Patient Account Number: [id number] Date of Birth/Sex: Treating RN: 14-Mar-1951 (70 y.o. [name] Primary Care Provider: [name] Other Clinician: Referring Provider: Treating Provider/Extender: [name] in Treatment: 74 History of Present Illness HPI Description: this is a patient who initially came to [place] for wounds on the medial malleoli bilaterally as well as her upper medial lower extremities bilaterally. These wounds eventually healed with assistance of Apligraf's bilaterally. While this was occurring she developed the current wound which opened into a fairly substantial wound on the right lower extremity. These are mostly secondary to venous stasis physiology however the patient also has underlying scleroderma, pulmonary hypertension. The wound has been making good progress lately with the Hydrofera Blue-based dressing. 03/17/2015; patient had a arterial evaluation a year ago. Her right ABI was 0.86 left was 1.0. T brachial index was 0.41 on the right 0.45 on the left. Her oe bilateral common femoral artery waveforms were triphasic. Her white popliteal posterior tibial artery and anterior tibial artery waveforms were monophasic with good amplitude. Luteal artery waveforms were biphasic it was felt that her bilateral great toe pressures are of normal although adequate for tissue healing. 03/24/2015. The condition of this wound is not really improved that. He was covered with as fibrinous surface slough and eschar. This underwent an aggressive debridement with both a curette and scalpel. I still cannot really get down to what I can would consider to be a viable surface. There is no evidence of infection. Previous workup for ischemia roughly a year ago was negative nevertheless I think  that continues to be a concern 04/07/15. The patient arrived for application of second Apligraf. Once again the surface of this wound is certainly less viable than I would like for an advanced treatment option. An aggressive debridement was done. She developed some arteriolar bleeding which required that along pressure and silver alginate. 04/21/15: change in the condition of this wound. Once again it is covered in a gelatinous surface slough. After debridement today surface of the wound looks somewhat better but now a heeling surface 05/05/15 Apligraf #4 applied.Still a lot of slough on this wound. 05/19/15 Apligraf #5 applied. Still a lot of slough on this wound I did not aggressively remove this 06/02/15; continued copious amounts of surface slough. This debridement fairly easily. 06/08/2015 -- the last time she had a venous duplex study done was over 3 years ago and the surgery was prior to that. I have recommended that she sees Dr. early for a another opinion regarding a repeat venous duplex and possibly more endovenous ablation of vein stripping of micro-phlebotomies. 06/16/15; wound has a gelatinous surface eschar that the debridement fairly easily to a point. I don't disagree with the venous workup and perhaps even arterial re-evaluation. She is on prednisone 5 mg and continue his medications for her pulmonary artery hypertension I am not sure if the latter have any wound care healing issues I would need to investigate this. 06/23/15 continues with a gelatinous surface eschar with of fibrinous underlying. What I can see of this wound does not look unhealthy however I just can't get this material which I think is 2 different layers off. Empiric culture done 06/30/15; unfortunately not a lot of change in this wound. A gelatinous surface eschar is easily removed however it has a tight  fibrinous surface underneath the. 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 The wound comes back and  basically and unchanged state the. She has a gelatinous surface that is more easily removed however there is a tightly fibrinous surface underneath the. There is no evidence of infection. She has a vascular follow-up next month. I would have to inject her in order to do a more aggressive debridement of this area 07/21/15 the wound is roughly in the same state albeit the debridement is done with greater ease. There is less of the fibrinous eschar underneath the. There is no evidence of infection. She has follow-up with vascular surgery next week. No evidence of surrounding infection. Her original distal wounds healed while this one formed. 07/28/15; wound is easier to debride. No wound erythema. She is seeing Dr. [name] next week. 08/18/15 Has been to [place] for repeat ablation. Have been using medihoney pad with some improvement 08/25/15; absolutely no change in the condition of this wound in either its overall size or surface condition in many months now. At one point I had this down to [place] healthier surface I think with [facility] however this did not actually progress towards closure. Do not believe that the wound has actually deteriorated in terms of volume at all. We have been using a medihoney pad which allows easier debridement of the gelatinous eschar but again no overall actual improvement. the patient is going towards an ablation with pain and vascular which I think is scheduled for next week. The only other investigation that I could first see would be a biopsy. She does have underlying scleroderma 09/02/15 eschar is much easier to debridement however the base of this does not look particularly vibrant. We changed to Iodoflex. The patient had her ablation earlier this week 09/09/15 again the debridement over the base of this wound is easier and the base of the wound looks considerably better. We will continue the Iodoflex. Dr. Tawni Millers has expressed his satisfaction with the result of her  ablation 09/15/15 once again the wound is relatively free of surface eschar. No debridement was done today. It has a pale-looking base to it. although this is not as deep as it once was it seems to be expanding especially inferiorly. She has had recent venous ablation but this is no closer to healing.I've gone ahead and done a punch biopsy this from the inferior part of the wound close to normal skin 09/22/15: the wound is relatively free of surface eschar. There is some surrounding eschar. I'm not exactly sure at what level the surface is that I am seeing. 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 No evidence of atypical infection, malignancy. 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 No debridement was done 09/29/15; the wound surface is relatively free of surface eschar. There is eschar surrounding the walls of the wound. Aside from the improvement in the amount of surface slough. This wound has not progressed any towards closure. There is not even a surface that looks like there at this is ready. There is no evidence of any infection nor maligancy based on biopsy I did on 9/15. I continued with the Iodoflex however I am looking towards some alternative to try to promote some closure or filling in of this surface. Consider triple layer Oasis. Collagen did not result in adequate control of the surface slough 10/13/15; the patient was in hospital last week with severe anemia. The wound looks somewhat  better after debridement although there is widening medially. There is no evidence of infection. 10/20/15; patient's wound on the right lateral lower leg is essentially unchanged. This underwent a light surface debridement and in general the debridement is easier and the surface looks improved. I noted in doing this on the side of the wound what appears to be a piece of calcium deposition. The patient noted that she had previous things on the tips of  her fingers. 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 She follows with rheumatology and I have asked them to talk to her about this. In view of that S the nonhealing ulcer may have something to do with calcinosis and also unrelieved Raynaud's disease in this area. I should note that her original wounds on the right and left medial malleolus and the inner aspects of both legs just below the knees did however heal over 10/30/15; the patient's wound on the right lateral lower leg is essentially unchanged. I was able to remove some calcified material from the medial wound edge. 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 Otherwise the wound appears essentially unchanged there is less adherent surface eschar. Some of the calcified material was sent to pathology for analysis 11/17/15. The patient's wound on the right lateral leg is essentially unchanged. Wider Medially. 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 There is some surface slough but I did not debridement this. No evidence of surrounding infection. 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 This will make healing difficult 12/15/15; the wound has less depth surface has a fibrinous slough and calcifications in the wound edge no evidence of infection 12/22/15; the wound definitely has less Fibrinous slough on the base. Calcifications around the wound edges are still evident. Although the wound bed looks healthier it is still pale in appearance. Previous biopsy did not show malignancy 01/04/15; surgical debridement of nonviable slough and  subcutaneous tissue the wound cleans up quite nicely but appears to be expanding outward calcifications around the wound edges are still there. 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 She is to see her rheumatologist I'll see if he has any opinions on this. My punch biopsy done in September did not show calcifications although these are clearly evident. 01/19/16 light selective debridement of nonviable surface slough. There is epithelialization medially. This gives me reason for cautious optimism. She has been to see her rheumatologist, there is nothing that can be done for this type of soft tissue calcification associated with scleroderma 02/02/16 no debridement although there is a light surface slough. She has 2 peninsulas of skin 1 inferiorly and one medially. We continue to make a slight and slow but definite progressive here 02/16/16; light surface debridement with more attention to the circumference of the wound bed where the fibrinous eschar is more prevalent. No calcifications detected. She seems to have done nicely with the [facility] with some epithelialization and some improvement in the overall wound volume. 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 Continued attention to the  circumference of the wound where the fibrinous eschar in calcinosis or prevalent. No calcifications were detected. She has continued improvement with Hydrofera Blue. The wound is no longer as deep 03/15/16 surgical debridement done to remove surface escha Especially around the circumference of the wound where there is nonviable subcutaneous tissue. In spite of this there is considerable improvement in the overall dimensions and depth of the wound. Islands of epithelialization are seen especially medially inferiorly and superiorly to a lesser extent.  She is using Hydrofera Blue at home 03/29/16; surgical debridement done to remove surface eschar and nonviable subcutaneous tissue. This cleans up quite nicely mention slightly larger in terms of length and width however depth is less 04/12/16; continued gradual improvement in terms of depth and the condition of the wound base. Debridement is done. Continuing long standing Hydrofera Blue at home with Kerlix Coban wraps 04/26/16; continued gradual improvement in terms of depth and management as well as condition of the wound base. Surgical debridement done she continues with Hydrofera Blue. 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 She initially came to this clinic venous insufficiency ulcers which have long since healed 05/17/16 continued improvement in terms of the depth and measurements of this wound although she has a tightly adherent fibrinous slough each time. We've been continued with long standing Hydrofera Blue which seems to done as well for this wound is many advanced treatment options. 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 She also has chronic venous insufficiency. She has an irritation on her lateral right ankle secondary to our wraps 05/10/16; wound appears to be smaller especially on the medial aspect and especially in the width. Wound was debridement surface looks better. She is also been in the hospital apparently with anemia again she tells me she had an endoscopy. Since she got home after 3 days which I believe was sometime last week she has had an irritated painful area on the right lateral ankle surrounding the lateral malleolus 05/31/16; much more adherent surface slough today then recently although I don't think the dimensions of changed that much. A more aggressive debridement is required. The irritated area over her medial malleolus is more pruritic and painful and I don't think represents  cellulitis 06/14/16; no major change in her wound dimensions however there is more tightly adherent surface slough which is disappointing. As well as she appears to have a new small area medially. Furthermore an irritated uncomfortable area on the lateral aspect of her right foot just below the lateral malleolus. 06/21/16; I'm seeing the patient back in follow-up for the new areas under her major wound on the right anterior leg. She has been using Hydrofera Blue to this area probably for several months now and although the dressings seem to be helping for quite a period of time I think things have stagnated lately. She comes in today with a relatively tight adherent surface slough and really no changes in the wound shape or dimension. The 2 small areas she had inferiorly are tiny but still open they seem improved this well. There is no uncontrolled edema and I don't think there is any evidence of cellulitis. 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 Patient has had vascular evaluations both venous and arterial. I have biopsied this area. There is no obvious infection. The worrisome thing today is that  she seems to be developing areas of erythema and epithelial damage on the medial aspect of the right foot. Also to a lesser degree inferior to the actual wound itself. Again I see no obvious changes to suggest cellulitis however as this is the only treatable option I will probably give her antibiotics. 07/13/16 no major change in the lady's large anterior right leg wound. Still covered with a very tightly adherent surface slough which is difficult to debridement. There is less erythema around this, culture last week grew pseudomonas I gave her ciprofloxacin. The area on her lateral right malleolus looks better- 08/02/16 the patient's wounds continued to decline. Her original large anterior right leg  wound looks deeper. Still adherent surface slough that is difficult to debridement. She has a small area just below this and a punched-out wound just below her lateral malleolus. In the meantime she is been in hospital with apparently an upper GI bleed on Plavix and aspirin. She is now just on Plavix she received 3 units of packed cells 08/23/16; since I last saw this 3 weeks ago, the open large area on her right leg looks about the same syrup. She has a small satellite lesion just underneath this. The area on her medial right ankle is now a deep necrotic wound. I attempted to debridement this however there is just too much pain. 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 She follows with vascular surgery and is scheduled for an angiogram in early September 09/06/16; the patient is going for an arteriogram tomorrow. Her original large wound on the right calf is about the same the satellite lesion underneath it is about the same however the area on her medial ankle is now deeper with exposed tendon. I am no longer attempting to debride these wounds 09/20/16; the patient has undergone a right femoral endarterectomy and Dacron patch angioplasty. This seems to have helped the flow in her right leg. 10/04/16; Arrived today for aggressive debridement of the wounds on her right calf the original wound the one beneath it and a difficult area over her right lateral leg just above the lateral malleolus. 10/25/16; her 3 open wounds are about the same in terms of dimensions however the surface appears a lot healthier post debridement. Using Iodoflex 10/18/16 we have been using Iodoflex to her wounds which she tolerated with some difficulty. 10/11/16; has been using Santyl for a period of time with some improvement although again very adherent surface slough would prevent any attempted healing this. She has a original wound on the left calf, the  satellite underneath that and the most recent wound on the right medial ankle. She has completed revascularization by Dr. early and has had venous ablation earlier. Want to go back to Iodoflex to see if week and get a healthier surface to this wound bed failing this I think she'll need to be taken to the OR and I am prepared to call Dr. Marla Roe to discuss this. She is obviously not a good candidate for general anesthesia however.; 11/08/16; I put her on Iodoflex last time to see if I can get the wound bed any healthier and unfortunately today still had tremendous surface slough. 11/15/16; 4 weeks' worth of Iodoflex with not much improvement. Debridement on the major wounds on her left anterior leg is easier however this does not maintain from week to week. The punched-out area on her medial right ankle 11/29/16; I attempted  to change the patient last visit to [facility] however she states this burned and was very uncomfortable therefore we gave her permission to go back to the eye out of complex which she already had at home. Also she noted a lot of pain and swelling on the lateral aspect of her leg before she traveled to [facility] for the holidays, I called her in doxycycline over the phone. This seems to have helped 12/06/16; Wounds unchanged by in large. Using Iodoflex 12/13/16; her wounds today actually looks somewhat better. The area on the right lateral lower leg has reasonably healthy-looking granulation and perhaps as actually filled and a bit. Debridement of the 2 wounds on the medial calf is easier and post debridement appears to have a healthier base. We have referred her to Dr. Migdalia Dk for consideration of operative debridement 12/20/16; we have a quick note from Dr. Merri Ray who feels that the patient needs to be referred to an academic center/plastic surgeon. This is due to the complexity of the patient's medical issues as it applies to anything in the OR. We  have been using Iodoflex 12/27/16; in general the wounds on the right leg are better in terms of the difficult to remove surface slough. She has been using Iodoflex. She is approved for Apligraf which I anticipated ordering in the next week or 2 when we get a better-looking surface 01/04/16 the deep wounds on the right leg generally look better. Both of them are debrided further surface slough. The area on the lateral right leg was not debrided. She is approved for Apligraf I think I'll probably order this either next week or the week after depending on the surface of the wounds superiorly. We have been using Iodoflex which will continue until then 01/11/16; the deep wounds on the right leg again have a surface slough that requires debridement. I've not been able to get the wound bed on either one of these wounds down to what would be acceptable for an advanced skin stab-like Apligraf. The area on the medial leg has been improving. We have been using hot Iodoflex to all wounds which seems to do the best at at least limiting the nonviable surface 01/24/17; we have continued Iodoflex and all her wound areas. Her debridement Gen. he is easier and the surface underneath this looks viable. Nevertheless these are large area wounds with exposed muscle at least on the anterior parts. We have ordered Apligraf's for 2 weeks from now. The patient will be away next week 02/07/17; the patient was close to have first Apligraf today however we did not order one. I therefore replaced her Iodoflex. She essentially has 3 large punched- out areas on her right anterior leg and right medial ankle. 02/11/17; Apligraf #1 02/25/17; Apligraf #2. In general some improvement in the right medial ankle and right anterior leg wounds. The larger wound medially perhaps some better 03/11/17; Apligraf #3. In general continued improvement in the right medial ankle and the right anterior leg wounds. 03/25/17 Apligraf #4. In general continued  improvement especially on the right medial ankle and the lower 04/08/17; Apligraf #5 in general continued improvement in all wound sites. 04/22/17; post Apligraf #5 her wound beds continued to look a lot better all of them up to the surface of the surrounding skin. Had a caramel-colored slough that I did not debrided in case there is residual Apligraf effect. The wounds are as good as I have seen these looking quite some time. 04/29/17; we applied  [place] and last week after completing her Apligraf. Wounds look as though they've contracted somewhat although they have a nonviable surface which was problematic in the past. Apparently she has been approved for further Apligraf's. We applied Iodosorb today after debridement. 05/06/17; we're fortunate enough today to be able to apply additional Apligraf approved by her insurance. In general all of her wounds look better Apligraf #6 05/24/17; Apligraf #7 continued improvement in all wounds 3 06/10/17 Apligraf #8. Continued improvement in the surface of all wounds. Not much of an improvement in dimensions as I might a follow 06/24/17 Apligraf #9 continued general improvement although not as much change in the wound areas I might of like. She has a new open area on the right anterior lateral ankle very small and superficial. She also has a necrotic wound on the tip of her right index finger probably secondary to severe uncontrolled Raynaud's phenomenon. She is already on sildenafil and already seen her rheumatologist who gave her Keflex. 07/08/17; Apligraf #10. Generalized improvement although she has a small additional wound just medial to the major wound area. 07/22/17; after discussion we decided not to reorder any further Apligraf's although there is been considerable improvement with these it hasn't been recently. The major wound anteriorly looks better. Smaller wounds beneath this and the more recent one and laterally look about the same. The area on the  right lateral lower leg looks about the same 07/29/17 this is a patient who is exceedingly complex. She has advanced scleroderma, crest syndrome including calcinosis, PAD status post revascularization, chronic venous insufficiency status post ablations. She initially presented to this clinic with wounds on her bilateral lower legs however these closed. More recently we have been dealing with a large open area superiorly on the right anterior leg, a smaller wound underneath this and then another one on the right medial lower extremity. These improve significantly with 10 Apligraf applications. Over the last 2-3 weeks we are making good progress with Hydrofera Blue and these seem to be making progress towards closure 08/19/17; wounds continued to make good improvement with Hydrofera Blue and episodic aggressive debridements 08/26/17; still using Hydrofera Blue. Good improvements 09/09/17; using Hydrofera Blue continued improvement. Area on the lateral part of her right leg has only a small remaining open area. The small inferior satellite region is for all intents and purposes closed. Her major wound also is come in in terms of depth and has advancing epithelialization. 09/16/17; using Hydrofera Blue with continued improvement. The smaller satellite wound. We've closed out today along with a new were satellite wound medially. The area on her medial ankle is still open and her major wound is still open but making improvement. All using Hydrofera Blue. Currently 09/30/17; using Hydrofera Blue. Still a small open area on the lateral right ankle area and her original major wound seems to be making gradual and steady improvement. 10/14/17; still using Hydrofera Blue. Still too small open areas on the right lateral ankle. Her original major wound is horizontal and linear. The most problematic area paradoxically seems to be the area to the medial area wears I thought it would be the lateral. 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 10/28/17; still using Hydrofera Blue. Right lateral ankle has a very small open area superiorly on the most lateral part of the wound. Her original open wound has 2 open areas now separated by normal skin and we've redefined this. 11/11/17; still using Hydrofera  Blue area and right lateral ankle continues to have a small open area on mostly the lateral part of the wound. Her original wound has 2 small open areas now separated by a considerable amount of normal skin 11/28/17; the patient called in slightly before Thanksgiving to report pain and erythema above the wound on the right leg. In the past this is responded well to treatment for cellulitis and I gave her over the phone doxycycline. She stated this resulted in fairly abrupt improvement. We have been using Hydrofera Blue for a prolonged period of time to the larger wound anteriorly into the remaining wound on her right right lateral ankle. The latter is just about closed with only a small linear area and the bottom of the [place]. 12/02/17; use endoform and left the dressing on since last visit. There is no tenderness and no evidence of infection. 12/16/17; patient has been using Endoform but not making much progress. The 2 punched out open areas anteriorly which were the reminiscence of her major wound appear deeper. The area on the lateral aspect of her right calf also appears deeper. Also she has a puzzling tender swelling above her wound on the right leg. This seems larger than last time. Just above her wounds there appears to be some fluctuance in this area it is not erythematous and there is no crepitus 12/30/17; patient has been using Endoform up until last week we used Hydrofera Blue. Ultrasound of the swelling above her 2 major wounds last time was negative for a fluid collection. I gave her cefaclor for the erythema and tenderness in this area which seems better.  Unfortunately both punched out areas anteriorly and the area on her right medial lower leg appear deeper. In fact the lateral of the wounds anteriorly actually looks as though it has exposed tendon and/or muscle sheath. She is not systemically unwell. She is complaining of vaginitis type symptoms presumably Candida from her antibiotics. 01/06/18; we're using santyl. she has 2 punched out areas anteriorly which were initially part of a large wound. Unfortunately medially this is now open to tendon/muscle. All the wounds have the same adherent very difficult to debride surface. 01/20/18; 2 week follow-up using Santyl. She has the 2 punched out areas anteriorly which were initially part of her large surface wound there. Medially this still has exposed muscle. All of these have the same tightly adherent necrotic surface which requires debridement. PuraPly was not accepted by the patient's insurance however her insurance I think it changed therefore we are going to run Apligraf to gain 02/03/18; the patient has been using Santyl. The wound on the right lateral ankle looks improved and the 2 areas anteriorly on the right leg looks about the same. The medial one has exposed muscle. The lateral 1 requiresdebridement. We use PuraPly today for the first week 02/10/18; PuraPly #2. The patient has 3 wounds. The area on the right lateral ankle, 2 areas anteriorly that were part of her original large wound in this area the medial one has exposed muscle. All of the wounds were lightly debrided with a number 3 curet. PuraPly #2 applied the lateral wound on the calf and the right lateral ankle look better. 02/17/18; PuraPly #3. Patient has 3 wounds. The area on the right lateral ankle in 2 areas internally that were part of her original large wound. The lateral area has exposed muscle. She arrived with some complaints of pain around the right ankle. 02/24/18; PuraPly #4; not much change in any  of the 3 wound areas. Right  lateral ankle, right lateral calf. Both of these required debridement with a #3 curet. She tolerates this marginally. The area on the medial leg still has exposed muscle. Not much change in dimensions 03/03/18 PuraPly #5. The area on the medial ankle actually looks better however the 2 separate areas that were original parts of the larger right anterior leg wound look as though they're attempting to coalesce. 03/10/18; PuraPly #6. The area on the medial ankle actually continues to look and measures smaller however the 2 separate areas that were part of the original large wound on the right anterior leg have now coalesced. There hasn't been much improvement here. The lateral area actually has underlying exposed muscle 03/17/18-she is here in follow-up evaluation for ulcerations to the right lower extremity. She is voicing no complaints or concerns. She tolerated debridement. Puraply#7 placed 03/24/18; difficult right lower extremity ulcerations. PuraPly #8 place. She is been approved for [employer]. She did very well with Apligraf today however she is apparently reached her "lifetime max" 03/31/18; marginal improvement with PuraPly although her wounds looked as good as they have in several weeks today. Used TheraSkins #1 04/14/18 TheraSkin #2 today 04/28/18 TheraSkins #3. Wound slightly improved 05/12/18; TheraSkin skin #4. Wound response has been variable. 05/27/18 TheraSkin #5. Generally improvement in all wound areas. I've also put her in 3 layer compression to help with the severe venous hypertension 06/09/18; patient is done quite well with the TheraSkins unfortunately we have no further applications. I also put her in 3 layer compression last week and that really seems to of helped. 06/16/18; we have been using silver collagen. Wounds are smaller. Still be open area to the muscle layer of her calf however even that is contracted somewhat. She tells me that at night sometimes she has pain on the right  lateral calf at the site of her lower wound. Notable that I put her into 3 layer compression about 3 weeks ago. She states that she dangles her leg over the bed that makes it feel better but she does not describe claudication during the day She is going to call her secondary insurance to see if they will continue to cover advanced treatment products I have reviewed her arterial studies from 01/22/17; this showed an ABI in the right of 1 and on the left noncompressible. TBI on the right at 0.30 on the left at 0.34. It is therefore possible she has significant PAD with medial calcification falsely elevating her ABI into the normal range. I'll need to be careful about asking her about this next week it's possible the 3 layer compression is too much 06/23/18; was able to reapply TheraSkin 1 today. Edema control is good and she is not complaining of pain no claudication 07/07/18;no major change. New wound which was apparently a taper removal injury today in our clinic between her 2 wounds on the right calf TheraSkins #2 07/14/18; I think there is some improvement in the right lateral ankle and the medial part of her wound. There is still exposed muscle medially. 07/28/18; two-week follow-up. TheraSkins#3. Unfortunately no major change. 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 She seems to be doing well and in the base of the wound appears to show some progress at this point. This is the last approved  Theraskin of the second set. 08/25/18; she has completed TheraSkin. There has been some improvement on the right lateral calf wound as well as the anterior leg wounds. The open area to muscle medially on the anterior leg wound is smaller. I'm going to transition her back to [facility] under Kerlix Coban change every  second day. She reports that she had some calcification removed from her right upper arm. We have had previous problems with calcifications in her wounds on her legs but that has not happened recently 09/08/18;using Hydrofera Blue on both her wound areas. Wounds seem to of contracted nicely. She uses Kerlix Coban wrap and changes at home herself 09/22/2018; using Hydrofera Blue on both her wound areas. Dimensions seem to have come down somewhat. There is certainly less depth in the medial part of the mid tibia wound and I do not think there is any exposed muscle at this point. 10/06/2018; 2-week follow-up. Using [facility] on her wound areas. Dimensions have come down nicely both on the right lateral ankle area in the right mid tibial area. She has no new complaints 10/20/2018; 2-week follow-up. She is using Hydrofera Blue. Not much change from the last time she was here. The area on the lateral ankle has less depth although it has raised edges on one side. I attempted to remove as much of the raised edge as I could without creating more additional wounds. The area on the right anterior mid tibia area looks the same. 11/03/2018; 2-week follow-up she is using Hydrofera Blue. On the right anterior leg she now has 2 wounds separated by a large area of normal skin. The area on the medial part still has I think exposed muscle although this area itself is a lot smaller. The area laterally has some depth. Both areas with necrotic debris. The area on her right lateral ankle has come in nicely 11/17/2018; patient continues to use Hydrofera Blue. We have been increasing separation of the 2 wounds anteriorly which were at one point joint the area on the right lateral calf continues to have I think some improvement in depth. 12/08/2018; patient continues to use Hydrofera Blue. There is some improvement in the area on the right lateral calf. The 2 areas that were initially part of the original large wound in the  mid right tibia are probably about the same. In fact the medial area is probably somewhat larger. We will run puraply through the patient's insurance 12/22/2018; she has been using Hydrofera Blue. We have small wounds on the right lateral calf and 2 small areas that were initially part of a large wound in the right mid tibia. We applied pure apply #1 today. 12/29/2018; we applied puraply #2. Her wounds look somewhat better especially on the right lateral calf and the lateral part of her original wound in the mid tibial area. 01/05/2019; perhaps slightly improved in terms of wound bed condition but certainly not as much improvement as I might of liked. Puraply #3 1/13: we did not have a correct sized puraply to apply. wounds more pinched out looking, I increased her compression to 3 layer last week to help with significant multilevel venous hypertension. Since then I've reviewed her arterial status. She has a right femoral endarterectomy and a distal left SFA stent. She was being followed by Dr. [name] for a period however she does not appear to have seen him in 3 years. I will set up an appointment. 1/20. The patient has an additional wound on the  right lateral calf between the distal wound and proximal wounds. We did not have Puraply last week. Still does not have a follow-up with Dr. early 1/27: Follow-up with Dr. early has been arranged apparently with follow-up noninvasive studies. Wounds are measuring roughly the same although they certainly look smaller 2/3; the patient had non invasive studies. Her ABIs on the right were 0.83 and on the left 1.02 however there was no great toe pressure bilaterally. Also worrisome monophasic waveforms at the PTA and dorsalis pedis. We are still using Puraply. We have had some improvement in all of the wounds especially the lateral part of the mid tibial area. 2/10; sees Dr. early of vein and vascular re-arterial studies next week. Puraply reapplied today. 2/17;  Dr. early of vein and vascular his appointment is tomorrow. Puraply reapplied after debridement of all wounds 2/24; the patient saw Dr. early I reviewed his note. sHe noted the previous right femoral endarterectomy with a Dacron patch. He also noted the normal ABI and the monophasic waveforms suggesting tibial disease. Overall he did not feel that she had any evidence of arterial insufficiency that would impair her wound healing. He did note her venous disease as well. He suggested PRN follow-up. 3/2; I had the last puraply applied today. The original wounds over the mid tibia area are improved where is the area on the right lower leg is not 3/9; wounds are smaller especially in the right mid tibia perhaps slightly in the right lateral calf. We finished with puraply and went to endoform today 3/23; the patient arrives after 2 weeks. She has been using endoform. I think all of her wounds look slightly better which includes the area on the right lateral calf just above the right lateral malleolus and the 2 in the right mid tibia which were initially part of the same wound. 4/27; TELEHEALTH visit; the patient was seen for telehealth visit today with her consent in the middle of the worldwide epidemic. Since she was last here she called in for antibiotics with pain and tenderness around the area on the right medial ankle. I gave her empiric doxycycline. She states this feels better. She is using endoform on both of these areas 5/11 TELEHEALTH; the patient was seen for telehealth visit today. She was accompanied at home by her husband. She has severe pulmonary hypertension accompanied scleroderma and in the face of the Covid epidemic cannot be safely brought into our clinic. We have been using endoform on her wound areas. There are essentially 3 wound areas now in the left mid tibia now 2 open areas that it one point were connected and one on the right lateral ankle just above the malleolus. 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 She cannot come to clinic. The wounds were reviewed today via telehealth. She has severe chronic venous hypertension which I think is centrally mediated. She has wounds on her right anterior tibia and right lateral ankle area. These are chronic. She has been using endoform. 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 We have been following her from telehealth. She has severe chronic venous hypertension which may be mostly centrally mediated secondary to right heart heart failure. She has wounds on  her right anterior tibia and right lateral ankle these are chronic we have been using endoform 6/22; TELEHEALTH; this patient was seen today via telehealth. 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 Since the last time we had contact with this patient she developed some pain and erythema around the wound on her right lateral malleolus/ankle and we put in antibiotics for her. This is resulted in good improvement with resolution of the erythema and tenderness. I changed her to silver alginate last time. We had been using endoform for an extended period of time 7/13; TELEHEALTH; this patient was seen today via telehealth. 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 She would continue to be at a prohibitive risk to be brought into our clinic unless this was absolutely necessary. These visits have been done with her approval as well as her husband. We have been using silver alginate to the areas on the right mid tibia and right lateral lower leg. 7/27  TELEHEALTH; patient was seen along with her husband today via telehealth. 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 We changed her to sample last visit. She has 2 areas a chronic wound on her right mid tibia and one just above her ankle. These were not the original wounds when she came into this clinic but she developed them during treatment 8/17; she comes in for her first face-to-face visit in a long period. She has a remaining area just medial to the right tibia which is the last open part of her large wound across this area. She also has an area on the right lateral lower leg. We prescribed Santyl last telehealth visit but they were concerned that this was making a deeper so they put silver alginate on it last week. Her husband changes the dressings. 8/31; using Santyl to the 2 wound areas some improvement in wound surfaces. Husband has surgery in 2 weeks we will put her out 3 weeks. Any of the advanced treatment options that I can think of that would be eligible for this wound would also cause her to have to come in weekly. The risk that the patient is just too high 9/21. Using Santyl to the 2 wound areas. Both of these are somewhat better although the medial mid tibia area still has exposed muscle. Lateral ankle requiring debridement. Using Santyl 10/12; using Santyl to the 2 wound areas. One on the right lateral ankle and the other in the medial calf which still has exposed muscle. Both areas have come down in size and have better looking surfaces. She has made nice progress with santyl 11/2; we have been using Santyl to the 2 wound areas. Right lateral ankle and the other in the mid tibia area the medial part of this still has exposed muscle. 11/23/2019 on evaluation today patient appears to be doing about the same really with regard to her wounds. She is actually not very pleased with how things seem to  be progressing at this point she tells me that she really has not noted much improvement unfortunately. With that being said there is no signs of active infection at this time. There is some slough buildup noted at this time which again along with some dry skin around the edges of the wound I think would benefit her to try to debride some  of this away. Fortunately her pain is doing fairly well. She still has exposed muscle in the right medial/tibial area. 12/14; TELEHEALTH; she was changed to [facility] to the right calf wound and right lateral ankle wound when she was here last time. Unfortunately since then she had a fall with a pelvic fracture and a fracture of her wrist. She was apparently hospitalized for 5 days. I have not looked at her discharge summary. She apparently came out of the hospital with a blister on her right heel. She was seen today via telehealth by myself and our case manager. The patient and her husband were present. She has been using Hydrofera Blue at our direction from the last time she was in the clinic. There is been no major improvement in fact the areas appear deeper and with a less viable surface 01/04/2020; TELEHEALTH; the patient was seen today in accompaniment of her husband and our nurse. She has 3 open areas 1 on the right medial mid tibia, one on the right lateral ankle and a large eschared pressure area on the right heel. We have been using Iodoflex to the 2 original wounds. 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 It is simply too perilous for her to be seen in any other way 1/26; TELEHEALTH; the patient was seen today in accompaniment of her husband and our RN one of our nurses. She still has the 3 open areas 1 on the right medial mid tibia which is the remanent of a more extensive wound in this area, one on the right lateral ankle and a large eschared pressure area on the right heel. We have been using Iodoflex to the 2  original wounds and a bed at 9 application to the eschared area on the heel 2/15; the first time we have seen this patient and then several months out of concern for the pandemic. She had a large horizontal wound in the mid tibia. Only the medial aspect of this is still open. Area on the lateral ankle is just about closed. She had a new pressure ulcer on the right heel which I have removed some of the eschar. We have been using Iodoflex which I will continue. The area in the mid tibia has a round circle in the middle of exposed muscle. I think we would have to use an advanced treatment product to stimulate granulation over this area. We will run this through her insurance. She is not eligible for plastic surgery for many reasons 3/1; TELEHEALTH. The patient was seen today by telehealth. 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 We have been using Iodoflex to the wound areas which include the right anterior mid tibia, right lateral ankle and the right heel. All of these were reviewed 3/8; the patient's wound just above the right ankle is closed. She still has a contracting black eschar on the heel where she had a pressure ulcer. The medial part of her original wound on the mid tibia has exposed muscle. We have made really made no progress in this area although we have managed to get a lot of the original wound in this area to close. We used Apligraf #1 today 3/22; the patient's ankle wound remains closed. She still has a contracting black eschar on the heel although it seems to have less surface area. It still not clear whether there is any depth here. We used Apligraf #  2 today in an attempt to get granulation over the exposed muscle and what is left of her mid tibia area. 4/5; everything is closed except the medial aspect of the mid tibia wound, as well as the pressure injury on the right heel. She has been  using Betadine to the right heel which has been gradually contracting. We used Apligraf #3 today. 4/19; Apligraf #4. Still a pressure area on the right heel she has been using Betadine superficial excoriated areas she has been applying salicylic acid to on the anterior leg and the thick horn on the leg just above her wound area 5/3; Apligraf #5. Unfortunately she came in today with a reopening of the area on the right lateral lower leg. Not much change in the wound we have been treating in the mid calf. Quite a bit of edema surrounding this wound. She reports that she was up on her feet quite a bit. The area on the right heel is separating she has been using Betadine She comes in with a new wound on the left lateral malleolus which is very disappointing this is been open since last week 5/17; we applied her last Apligraf last time. Unfortunately that did not have any effect on the deep area medially in the mid tibial area. However the area over the right lateral malleolus is a lot better. Right heel also is contracted we used Iodoflex last time. The new wound on the left lateral malleolus were using Santyl to. This required debridement. 6/1. Not much change in the area on the right medial mid tibia. Right lateral ankle is improved she has the necrotic area on the right heel which was a pressure area. New area on the left lateral malleolus from last time. I changed her to Iodoflex to the area on the left lateral malleolus she said this hurt I have put her back on Santyl 6/15; right mid tibia is unchanged. I do not think there is anything I can do to this topically that we will get this to granulate over the muscle. And I am furthermore I am not sure that she is a surgical candidate either because of her severe pulmonary issues or because she really does not want to go through it. The area on the right lateral malleolus is a small punched out area. The area on the left lateral malleolus again small  punched out with nonviable surface Pressure ulcer on the right heel at separating black eschar. I went ahead and remove this today she has an area on the left anterior mid tibia just laterally. Geographic wounds debris on the surface. Some erythema here. 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 There is skin changes widely Change the primary dressing to Sorbact to all wound areas. She needs compression on the left leg as well as the right 6/21; right mid tibia which was her one remaining wound at 1 point is unchanged The area on the right lateral malleolus actually is close to closing over still a small punched out area The area on the left lateral malleolus again a deep wound it is this 1 that she feels pain in. Pressure ulcer on the right heel was cultured today. New area on the left anterior mid tibia several geographic wounds last week in the setting of chronic venous insufficiency this actually looks some better 7/6; Right mid tibia about the same. Exposed muscle Left lateral malleolus again necrotic debris over the surface. Pressure ulcer on the right heel. She  finished the Keflex we prescribed. Necrotic debris debrided with a #3 curette The rest of her wounds on the right mid tibia right lateral ankle are better Her husband reminds me that she had a right femoral endarterectomy and has 2 stents in her left thigh placed in 2011 approximately by vascular surgery in [place]. She saw Dr. [name] last in February 2020. At that point he did not think that the arterial insufficiency she had on the right was sufficient to explain any of her right lower extremity wounds however she now has an area on the left lateral malleolus that looks like an ischemic wound 7/12; we have been using Sorbact all wound areas Right mid tibia about the same exposed muscle Left lateral malleolus small wound with debris in the surface punched out Pressure ulcer of the right heel requiring  debridement of necrotic debris Lateral ankle wound on the right is just about closed Right mid tibia wound about the same still with exposed muscle. 7/26 really no change in any wounds. We have been using Sorbact. She has bilateral lower extremity wounds. This is in the setting of scleroderma, severe venous hypertension. She also has known PAD and I had really hoped to be able to get a review by Dr. [name] but apparently that is not booked until August 31. I have asked her to call back to vein and vascular and get an appointment with somebody a little earlier if that is possible. 8/16; patient's area on the right ankle which was a chronic wound is closed over again. The area on the right mid lower leg, right heel left lateral ankle are all about the same. Pressure ulcer on the right heel is still not viable. New areas identified on the right lateral calf. She has a follow-up with Dr. [name] on 8/31. Phenomenally I would like him to go over her arterial status with regards to her underlying stents. 9/13; patient had her ARTERIAL studies done however apparently Dr. [name] is now in [place] so she did not get an appointment with him on 8/31 and was not referred to any other provider. On the right side her PTA was monophasic. Noncompressible. TBI on the right at 0.41 on the left she was monophasic and noncompressible they did not do a TBI because she had a Band-Aid on her big toe. Patient does not describe claudication although she is having a lot of pain in the left lateral malleolus. 10/11; the patient had a right-sided interventions on 10/5. She had a angioplasty of the right anterior tibial artery as well as the tibioperoneal trunk/peroneal artery she had a drug-coated balloon angioplasty of the right superficial femoral artery. On the left she did not have any interventions yet but is going to have another look at this in 2 weeks. The superficial femoral artery on the left and the associated stent  are patent popliteal artery is patent to the dominant vessel runoff is the anterior tibial which has several high-grade stenosis in the proximal one third. We have been using Sorbact. She also has a new area on the right upper mid tibia. 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 She tolerated this well. She is using Iodoflex and her husband is doing a kerlix Coban on her. She arrives in  clinic today with the original medial part of her mid tibia wound with muscle exposure small area on the right ankle which was part of her original wound she has 2 punched-out areas laterally and 1 above her area anteriorly. Nonviable surfaces similarly the right heel is nonviable. On the left side she has an area on the left foot and the left lateral ankle similar nonviable surfaces. 11/8; patient arrives in clinic today with nothing improved. She has a multiple lightly group of wounds on the right lower leg presumably different etiologies including a skin biopsy, the original wound she had medially and some punched-out areas that are not of clear defined etiology. She says she was in a lot of pain this week could not have her leg up on the bed did not get completely relief from dropping it over the bed. She also has a small area on the left lateral malleolus and the left lateral foot necrotic surfaces on all of these wounds. We have been using Iodoflex I attempted to put her in 3 layer compression last week that did not go well. We will be backing off on kerlix Coban this week. 11/15; the ultrasound that I sent the patient for last week showed findings consistent with acute deep vein thrombosis involving the right distal femoral vein and right popliteal vein. I was  concerned about the combination of Plavix and Xarelto however apparently Dr. [name] felt that was not a problem. She is now going through the acute dosing of Xarelto. She has an appointment with Dr. Trula Slade on 12/5. 11/29; 10 days ago before he left for weeks vacation she called to report pain in the right leg I sent her in some doxycycline her husband thought things were getting better however she required admission to hospital from 11/22 through 11/24 prompted by finding that her hemoglobin was 5.7 she was transfused 2 units of packed cells and her hemoglobin stabilized. She did undergo an endoscopy with findings of significant esophagitis part of which was candidal and part of it reflux. She was discharged on nystatin swish and swallow which she is doing. She seems to be better from that regard. There was no active other active source of bleeding Unfortunately the combination of Xarelto and Plavix was felt to be too much for her. The Xarelto was stopped and an IVC filter was placed she is continued on Plavix. Her big complaint today is pain in the right calf really from the mid aspect superiorly. This is very tender slightly red but not particularly warm. She did see vein and vascular in the hospital I will see if I can pull their note 12/6; patient was at Dr. Stephens Shire office today. Great toe pressure on the right is 54 on the left 49 she has a 50 to 74% stenosis of the right mid popliteal on the left at 30 to 49% proximal SFA and a 50 to 74% CFA. She was treated with angioplasty of the right ATA, right TP trunk and peroneal artery as well as an angioplasty of the right SFA to popliteal artery. On 10/26 she underwent angioplasty of the left ATA and left TP trunk she has a history of a left common femoral endarterectomy and left superficial femoral artery stent as well as a right superficial femoral artery stent. In addition to her arterial disease she has significant venous disease with a  history of a right greater saphenous vein ablation and a recent DVT in the right femoral and popliteal vein with insertion  of an IVC filter. She has remained on Plavix but her anticoagulant was stopped. She continues to have a lot of swelling and pain in the right leg with significant erythema which I am assuming is stasis dermatitis. She has had substantial wound areas on the right leg including right lateral a large necrotic wound with a smaller biopsy site above this. She has original wound on the left mid tibia with a small area above this as well. On the left she has a small area over the left lateral malleolus and the left lateral foot 12/22-week follow-up. Her right leg looks really terrible. She has another necrotic wound just above her original one on the right posterior leg. Greenish drainage suggestive of gram-negative/Pseudomonas infection. The entire mid part of her leg is erythematous I think most of this is stasis dermatitis rather than cellulitis. She has also a bothersome ischemic-looking right fourth toe which I noticed today without her really complaining. She has not been systemically unwell although she looks more physically frail 12/27; I reached out to vein and vascular last weekend Dr. [name] will be seeing her on Thursday. She has a gangrenous area on the tip of the right fourth toe. Multiple areas breaking down. I think this is a combination of arterial and severe venous insufficiency she has multiple wounds in the right leg but not a lot of pain. She has 3 small areas on the left leg we have been using Santyl. On the right leg we have been using silver alginate or at least that is what they have been using Culture I did last week showed abundant is Pseudomonas aeruginosa had a few Enterococcus faecalis. I have been using Ceftin here predominantly because of the green drainage suggesting Pseudomonas I have not yet addressed the Enterococcus which was ampicillin sensitive 1/7;  the patient was kindly seen by vein and vascular. She underwent a repeat ANGIOGRAM on 01/02/2021 by Dr. Donzetta Matters. She appears to have had bilateral common femoral treatments in the past. The right side is heavily heavily calcified and she has 1 area of 50% stenosis in the SFA that is nonflow limiting. She has dominant runoff via the anterior tibial and peroneal arteries. On the left side she also has a heavily calcified SFA no flow-limiting stenosis in the popliteal artery then she she then has runoff via the anterior tibial and posterior tibial arteries without flow-limiting stenosis. She was not felt to require any intervention. She continues to have pain in the right leg with erythema although the erythema is somewhat better. Green drainage coming from the wounds on the posterior lateral right leg which are clearly necrotic. I have had her on 2-1/2 weeks of cefdinir this is while we awaited further arterial evaluation which is completed. I have now turned some thought to the status of her venous system particularly the DVT she had in November. This was an acute DVT involving the right distal femoral and right popliteal veins. She was then started on Xarelto in combination with Plavix but she required hospitalization in November with GI bleeding. She had an endoscopy by Dr. Lizbeth Bark of GI. This showed moderately severe esophagitis a combination of both food particles reflux and Candida. Localized mild inflammation characterized by erythema was found in the cardiac gastric fundus and the gastric body. The duodenal bulb's in the stages of the duodenum were normal. She then underwent a capsule endoscopy which showed evidence of oozing blood from the esophagus but no evidence of small bowel bleeding. She  was not restarted on her Xarelto, she continues on Plavix and she has an IVC filter 01/12/2021; we brought this patient in for a nurse visit today however I saw her because of copious amounts of green drainage.  Under 3 layer compression the degree of erythema in her lower leg on the right was better however there is indeed large amounts of green drainage I think coming out of the large wound on the posterior lateral leg. There is also a satellite area above this. She complains of fairly constant pain in fact she called our clinic earlier in the week to report that Sorbact which we are using topically was hurting her. We explained that we had never heard Sorbact do this. She has not been systemically unwell. My thoughts are on this lady that she had recent angiograms and there is nothing further that can be done. She has a gangrenous right fourth toe but there should be enough blood flow to heal wounds on her legs. She did not have another intervention. She is on Plavix with an IVC filter for her previous stents. She had an acute DVT in November I believe and was on Xarelto and Plavix but had a GI bleed of I think esophageal source. She has some swelling in the right thigh marked erythema and swelling in the right mid calf. I wonder whether she is extended the clot burden in her thigh as a cause of the distal DVT like symptoms. 1/18; the patient's repeat duplex ultrasound did not show extension of her DVT in fact this had recanalized and was better. At my request she is seeing Dr. Oneida Alar on Thursday to look over the recent angiogram in the ischemic looking areas in her right fourth and today her right second toe. This foot is cold and I wonder whether she either has microvascular disease and/or severe Raynaud's on top of her known macrovascular disease although her recent angiogram did not show anything that needed a particular procedure. The other issue has been copious amounts of drainage coming out of these large necrotic wounds on her right lateral calf. This is almost a jelly green color and consistency. PCR culture I did last week showed high amounts of Pseudomonas which was hardly surprising but also  high amounts of Enterococcus faecalis. She is allergic to penicillin, Levaquin and sulfa. There is not a good oral combination of a single drug that would cover this. I am going to ask for infectious disease. About the only positive thing I can say about the right leg as she is not in as much pain as she was several weeks ago. I gave her a prolonged course of cefdinir orally and have increased her compression. The leg is certainly less red and less tender but she is beginning develop ischemic changes in her right forefoot 1/24; the patient went to see Dr. Oneida Alar who said from an arterial/macrovascular and venous standpoint he thought everything was optimized. He referred her to Dr. [name]. Dr. [name] felt he could debride I presume the major wound area areas on the right posterior lateral calf and place a wound matrix presumably ACell. She sees infectious disease tomorrow I think she needs antibiotics perhaps IV antibiotics. This is to address the culturing Pseudomonas and Enterococcus from PCR culture that we got 10 days ago. She also has a topical antibiotic compounded which we can apply to this area topically. The patient has expanding necrotic wounds on the right posterior lateral calf and multiple small areas anteriorly. This  whole area was very angry and erythematous when she came back from her arterial evaluation. At that point I gave her 2 weeks of cefdinir with some improvement in the pain and the erythema but things have never gotten any better. 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 2/21; its been almost a month since I saw this patient. She was admitted the hospital from 02/06/2021 through 02/10/2021. Letter with encephalopathy acute on chronic renal failure, atrial fibrillation.  ID was consulted they stopped the antibiotics that Dr. Gale Journey has started as an outpatient. She did receive operative debridement of the wounds on the right posterior lower extremity. Apparently they were grossly necrotic. This was seen by plastic surgery last week. We did not look at this today but apparently there is seeing Dr. [name] this week on Wednesday. She comes in today with her leg wrapped on the right side. She had necrotic areas on the dorsal aspect of her first third and fourth toes as well as the left second. These were all new to me. I suspect this is probably related to digital vasculopathy related to her scleroderma. She also has small punched-out areas on the left lateral foot left lateral malleolus which are not new Electronic Signature(s) Signed: 02/20/2021 5:55:48 PM By: Linton Ham MD Entered By: Linton Ham on 02/20/2021 17:43:53 -------------------------------------------------------------------------------- Physical Exam Details Patient Name: Date of Service: [name], [name] 02/20/2021 11:00 A M Medical Record Number: [id number] Patient Account Number: [id number] Date of Birth/Sex: Treating RN: January 31, 1951 (70 y.o. [name] Primary Care Provider: [name] Other Clinician: Referring Provider: Treating Provider/Extender: [name] in Treatment: 465 Constitutional Sitting or standing Blood Pressure is within target range for patient.. Pulse regular and within target range for patient.Marland Kitchen Respirations regular, non-labored and within target range.. Temperature is normal and within the target range for the patient.. She is not in any distress but looks more frail. Respiratory work of breathing is normal. Notes Wound exam I did not look at her right leg however she has necrotic wounds of the dorsal first third and fourth toes I did not attempt debridement here these are relatively substantial. Also a similar area on the left second  toe. The 2 small areas on the left lateral foot and left lateral ankle/lateral malleolus look about the same to have nonviable tissue Electronic Signature(s) Signed: 02/20/2021 5:55:48 PM By: Linton Ham MD Entered By: Linton Ham on 02/20/2021 17:44:58 -------------------------------------------------------------------------------- Physician Orders Details Patient Name: Date of Service: [name], [name] 02/20/2021 11:00 A M Medical Record Number: [id number] Patient Account Number: [id number] Date of Birth/Sex: Treating RN: 1951/11/04 (70 y.o. 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 Bathing/ Shower/ Hygiene May shower and wash wound with soap and water. - on days that dressing is changed Edema Control - Lymphedema / SCD / Other Bilateral Lower Extremities Elevate legs to the level of the heart or above for 30 minutes daily and/or when sitting, a  frequency of: - throughout the day Avoid standing for long periods of time. Exercise regularly [place] wound care orders this week; continue Home Health for wound care. May utilize formulary equivalent dressing for wound treatment orders unless otherwise specified. - Silver alginate to all toe wounds, change primary dressing on left leg and left foot to Iodosorb gel (or Iodoflex). Continue to follow orders by Dr. [name] for right lower leg wounds Other Home Health Orders/Instructions: - [facility] Wound Treatment Wound #20 - Malleolus Wound Laterality: Left, Lateral Cleanser: Soap and Water Every Other Day/7 Days Discharge Instructions: May shower and wash wound with dial antibacterial soap and water prior to dressing change. Peri-Wound Care: Sween Lotion (Moisturizing lotion) Every Other Day/7 Days Discharge Instructions: Apply moisturizing lotion as directed Prim Dressing: Iodosorb Gel 10 (gm) Tube Every Other Day/7 Days ary Discharge Instructions: Apply to wound bed as instructed Secondary Dressing: Woven Gauze Sponge, Non-Sterile 4x4 in (Generic) Every Other Day/7 Days Discharge Instructions: Apply over primary dressing as directed. Secondary Dressing: ABD Pad, 8x10 Every Other Day/7 Days Discharge Instructions: Apply over primary dressing as directed. Compression Wrap: Kerlix Roll 4.5x3.1 (in/yd) (Generic) Every Other Day/7 Days Discharge Instructions: Apply Kerlix and Coban compression as directed. Compression Wrap: Coban Self-Adherent Wrap 4x5 (in/yd) (Generic) Every Other Day/7 Days Discharge Instructions: Apply over Kerlix as directed. Wound #25 - Foot Wound Laterality: Left, Lateral Cleanser: Soap and Water Every Other Day/7 Days Discharge Instructions: May shower and wash wound with dial antibacterial soap and water prior to dressing change. Peri-Wound Care: Sween Lotion (Moisturizing lotion) Every Other Day/7 Days Discharge Instructions: Apply moisturizing lotion as  directed Prim Dressing: Iodosorb Gel 10 (gm) Tube Every Other Day/7 Days ary Discharge Instructions: Apply to wound bed as instructed Secondary Dressing: Woven Gauze Sponge, Non-Sterile 4x4 in (Generic) Every Other Day/7 Days Discharge Instructions: Apply over primary dressing as directed. Secondary Dressing: ABD Pad, 8x10 Every Other Day/7 Days Discharge Instructions: Apply over primary dressing as directed. Compression Wrap: Kerlix Roll 4.5x3.1 (in/yd) (Generic) Every Other Day/7 Days Discharge Instructions: Apply Kerlix and Coban compression as directed. Compression Wrap: Coban Self-Adherent Wrap 4x5 (in/yd) (Generic) Every Other Day/7 Days Discharge Instructions: Apply over Kerlix as directed. Wound #30 - T Fourth oe Wound Laterality: Right Cleanser: Soap and Water Every Other Day/30 Days Discharge Instructions: May shower and wash wound with dial antibacterial soap and water prior to dressing change. Peri-Wound Care: Sween Lotion (Moisturizing lotion) Every Other Day/30 Days Discharge Instructions: Apply moisturizing lotion as directed Prim Dressing: KerraCel Ag Gelling Fiber Dressing, 4x5 in (silver alginate) (Generic) Every Other Day/30 Days ary Discharge Instructions: Apply silver alginate to wound bed as instructed Secondary Dressing: Woven Gauze Sponge, Non-Sterile 4x4 in (Generic) Every Other Day/30 Days Discharge Instructions: Apply over primary dressing as directed. Secured With: Child psychotherapist, Sterile 2x75 (in/in) Every Other Day/30 Days Discharge Instructions: Secure with stretch gauze as directed. Wound #  31 - Ankle Wound Laterality: Left, Medial Cleanser: Soap and Water Every Other Day/7 Days Discharge Instructions: May shower and wash wound with dial antibacterial soap and water prior to dressing change. Peri-Wound Care: Sween Lotion (Moisturizing lotion) Every Other Day/7 Days Discharge Instructions: Apply moisturizing lotion as directed Prim  Dressing: Iodosorb Gel 10 (gm) Tube Every Other Day/7 Days ary Discharge Instructions: Apply to wound bed as instructed Secondary Dressing: Woven Gauze Sponge, Non-Sterile 4x4 in (Generic) Every Other Day/7 Days Discharge Instructions: Apply over primary dressing as directed. Secondary Dressing: ABD Pad, 8x10 Every Other Day/7 Days Discharge Instructions: Apply over primary dressing as directed. Compression Wrap: Kerlix Roll 4.5x3.1 (in/yd) (Generic) Every Other Day/7 Days Discharge Instructions: Apply Kerlix and Coban compression as directed. Compression Wrap: Coban Self-Adherent Wrap 4x5 (in/yd) (Generic) Every Other Day/7 Days Discharge Instructions: Apply over Kerlix as directed. Wound #32 - T Great oe Wound Laterality: Right Cleanser: Soap and Water Every Other Day/30 Days Discharge Instructions: May shower and wash wound with dial antibacterial soap and water prior to dressing change. Peri-Wound Care: Sween Lotion (Moisturizing lotion) Every Other Day/30 Days Discharge Instructions: Apply moisturizing lotion as directed Prim Dressing: KerraCel Ag Gelling Fiber Dressing, 4x5 in (silver alginate) (Generic) Every Other Day/30 Days ary Discharge Instructions: Apply silver alginate to wound bed as instructed Secondary Dressing: Woven Gauze Sponge, Non-Sterile 4x4 in (Generic) Every Other Day/30 Days Discharge Instructions: Apply over primary dressing as directed. Secured With: Child psychotherapist, Sterile 2x75 (in/in) Every Other Day/30 Days Discharge Instructions: Secure with stretch gauze as directed. Wound #33 - T Second oe Wound Laterality: Right Cleanser: Soap and Water Every Other Day/30 Days Discharge Instructions: May shower and wash wound with dial antibacterial soap and water prior to dressing change. Peri-Wound Care: Sween Lotion (Moisturizing lotion) Every Other Day/30 Days Discharge Instructions: Apply moisturizing lotion as directed Prim Dressing: KerraCel Ag  Gelling Fiber Dressing, 4x5 in (silver alginate) (Generic) Every Other Day/30 Days ary Discharge Instructions: Apply silver alginate to wound bed as instructed Secondary Dressing: Woven Gauze Sponge, Non-Sterile 4x4 in (Generic) Every Other Day/30 Days Discharge Instructions: Apply over primary dressing as directed. Secured With: Child psychotherapist, Sterile 2x75 (in/in) Every Other Day/30 Days Discharge Instructions: Secure with stretch gauze as directed. Wound #34 - T Third oe Wound Laterality: Right Cleanser: Soap and Water Every Other Day/30 Days Discharge Instructions: May shower and wash wound with dial antibacterial soap and water prior to dressing change. Peri-Wound Care: Sween Lotion (Moisturizing lotion) Every Other Day/30 Days Discharge Instructions: Apply moisturizing lotion as directed Prim Dressing: KerraCel Ag Gelling Fiber Dressing, 4x5 in (silver alginate) (Generic) Every Other Day/30 Days ary Discharge Instructions: Apply silver alginate to wound bed as instructed Secondary Dressing: Woven Gauze Sponge, Non-Sterile 4x4 in (Generic) Every Other Day/30 Days Discharge Instructions: Apply over primary dressing as directed. Secured With: Child psychotherapist, Sterile 2x75 (in/in) Every Other Day/30 Days Discharge Instructions: Secure with stretch gauze as directed. Wound #35 - T Second oe Wound Laterality: Left Cleanser: Soap and Water Every Other Day/30 Days Discharge Instructions: May shower and wash wound with dial antibacterial soap and water prior to dressing change. Peri-Wound Care: Sween Lotion (Moisturizing lotion) Every Other Day/30 Days Discharge Instructions: Apply moisturizing lotion as directed Prim Dressing: KerraCel Ag Gelling Fiber Dressing, 4x5 in (silver alginate) (Generic) Every Other Day/30 Days ary Discharge Instructions: Apply silver alginate to wound bed as instructed Secondary Dressing: Woven Gauze Sponge, Non-Sterile 4x4 in  (Generic) Every Other Day/30 Days  Discharge Instructions: Apply over primary dressing as directed. Secured With: Child psychotherapist, Sterile 2x75 (in/in) Every Other Day/30 Days Discharge Instructions: Secure with stretch gauze as directed. Electronic Signature(s) Signed: 02/20/2021 5:41:15 PM By: Levan Hurst RN, BSN Signed: 02/20/2021 5:55:48 PM By: Linton Ham MD Entered By: Levan Hurst on 02/20/2021 12:08:39 -------------------------------------------------------------------------------- Problem List Details Patient Name: Date of Service: [name], [name] 02/20/2021 11:00 A M Medical Record Number: [id number] Patient Account Number: [id number] Date of Birth/Sex: Treating RN: 11/12/1951 (70 y.o. 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 02/24/2015 No Yes I87.331 Chronic venous hypertension (idiopathic) with ulcer and inflammation of right 10/04/2016 No Yes lower extremity L89.616 Pressure-induced deep tissue damage of right heel 12/14/2019 No Yes L97.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 05/31/2020 No Yes L97.528 Non-pressure chronic ulcer of other part of left foot with other specified 12/05/2020 No Yes severity I70.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 10/31/2020 No Yes lower leg I70.248 Atherosclerosis of native arteries of left leg with ulceration of other part of 10/31/2020 No  Yes lower leg L97.518 Non-pressure chronic ulcer of other part of right foot with other specified 02/20/2021 No Yes severity Inactive Problems ICD-10 Code Description Active Date Inactive Date 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 02/20/2021 11:00 A M Medical Record Number: [id number] Patient Account Number: [id number] Date of Birth/Sex: Treating RN: [birth year] (70 y.o. [name] Primary Care Provider: [name] Other Clinician: Referring Provider: Treating Provider/Extender: [name] in Treatment: 465 Subjective History of Present Illness (HPI) this is a patient who initially came to [place] for wounds on the medial malleoli bilaterally as well as her upper medial lower extremities bilaterally. These wounds eventually healed with assistance of Apligraf's bilaterally. While this was occurring she developed the current wound which opened into a fairly substantial wound on the right lower extremity. These are mostly secondary to venous stasis physiology however the patient also has underlying scleroderma, pulmonary hypertension. The wound has been making good progress lately with the Hydrofera Blue-based dressing. 03/17/2015; patient had a arterial evaluation a year ago. Her right ABI was 0.86 left was 1.0. T brachial index was 0.41 on the right 0.45 on the left. Her oe bilateral  common femoral artery waveforms were triphasic. Her white popliteal posterior tibial artery and anterior tibial artery waveforms were monophasic with good amplitude. Luteal artery waveforms were biphasic it was felt that her bilateral great toe pressures are of normal although adequate for tissue healing. 03/24/2015. The condition of this wound is not really improved that. He was covered with  as fibrinous surface slough and eschar. This underwent an aggressive debridement with both a curette and scalpel. I still cannot really get down to what I can would consider to be a viable surface. There is no evidence of infection. Previous workup for ischemia roughly a year ago was negative nevertheless I think that continues to be a concern 04/07/15. The patient arrived for application of second Apligraf. Once again the surface of this wound is certainly less viable than I would like for an advanced treatment option. An aggressive debridement was done. She developed some arteriolar bleeding which required that along pressure and silver alginate. 04/21/15: change in the condition of this wound. Once again it is covered in a gelatinous surface slough. After debridement today surface of the wound looks somewhat better but now a heeling surface 05/05/15 Apligraf #4 applied.Still a lot of slough on this wound. 05/19/15 Apligraf #5 applied. Still a lot of slough on this wound I did not aggressively remove this 06/02/15; continued copious amounts of surface slough. This debridement fairly easily. 06/08/2015 -- the last time she had a venous duplex study done was over 3 years ago and the surgery was prior to that. I have recommended that she sees Dr. early for a another opinion regarding a repeat venous duplex and possibly more endovenous ablation of vein stripping of micro-phlebotomies. 06/16/15; wound has a gelatinous surface eschar that the debridement fairly easily to a point. I don't disagree with the venous workup and  perhaps even arterial re-evaluation. She is on prednisone 5 mg and continue his medications for her pulmonary artery hypertension I am not sure if the latter have any wound care healing issues I would need to investigate this. 06/23/15 continues with a gelatinous surface eschar with of fibrinous underlying. What I can see of this wound does not look unhealthy however I just can't get this material which I think is 2 different layers off. Empiric culture done 06/30/15; unfortunately not a lot of change in this wound. A gelatinous surface eschar is easily removed however it has a tight fibrinous surface underneath the. 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 The wound comes back and basically and unchanged state the. She has a gelatinous surface that is more easily removed however there is a tightly fibrinous surface underneath the. There is no evidence of infection. She has a vascular follow-up next month. I would have to inject her in order to do a more aggressive debridement of this area 07/21/15 the wound is roughly in the same state albeit the debridement is done with greater ease. There is less of the fibrinous eschar underneath the. There is no evidence of infection. She has follow-up with vascular surgery next week. No evidence of surrounding infection. Her original distal wounds healed while this one formed. 07/28/15; wound is easier to debride. No wound erythema. She is seeing Dr. [name] next week. 08/18/15 Has been to [place] for repeat ablation. Have been using medihoney pad with some improvement 08/25/15; absolutely no change in the condition of this wound in either its overall size or surface condition in many months now. At one point I had this down to [place] healthier surface I think with [facility] however this did not actually progress towards closure. Do not believe that the wound has actually deteriorated in terms of volume at all. We have been using a medihoney  pad which allows easier debridement of the gelatinous eschar but again no overall actual improvement.  the patient is going towards an ablation with pain and vascular which I think is scheduled for next week. The only other investigation that I could first see would be a biopsy. She does have underlying scleroderma 09/02/15 eschar is much easier to debridement however the base of this does not look particularly vibrant. We changed to Iodoflex. The patient had her ablation earlier this week 09/09/15 again the debridement over the base of this wound is easier and the base of the wound looks considerably better. We will continue the Iodoflex. Dr. Tawni Millers has expressed his satisfaction with the result of her ablation 09/15/15 once again the wound is relatively free of surface eschar. No debridement was done today. It has a pale-looking base to it. although this is not as deep as it once was it seems to be expanding especially inferiorly. She has had recent venous ablation but this is no closer to healing.I've gone ahead and done a punch biopsy this from the inferior part of the wound close to normal skin 09/22/15: the wound is relatively free of surface eschar. There is some surrounding eschar. I'm not exactly sure at what level the surface is that I am seeing. 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 No evidence of atypical infection, malignancy. 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 No debridement was done 09/29/15; the wound surface is relatively free of surface eschar. There is eschar surrounding the walls of the wound. Aside from the improvement in the amount of surface slough. This wound has not progressed any towards closure. There is not even a surface that looks like there at this is ready. There is no evidence of any infection nor maligancy based on biopsy I did on 9/15. I continued with the Iodoflex however I am looking towards some  alternative to try to promote some closure or filling in of this surface. Consider triple layer Oasis. Collagen did not result in adequate control of the surface slough 10/13/15; the patient was in hospital last week with severe anemia. The wound looks somewhat better after debridement although there is widening medially. There is no evidence of infection. 10/20/15; patient's wound on the right lateral lower leg is essentially unchanged. This underwent a light surface debridement and in general the debridement is easier and the surface looks improved. I noted in doing this on the side of the wound what appears to be a piece of calcium deposition. The patient noted that she had previous things on the tips of her fingers. 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 She follows with rheumatology and I have asked them to talk to her about this. In view of that S the nonhealing ulcer may have something to do with calcinosis and also unrelieved Raynaud's disease in this area. I should note that her original wounds on the right and left medial malleolus and the inner aspects of both legs just below the knees did however heal over 10/30/15; the patient's wound on the right lateral lower leg is essentially unchanged. I was able to remove some calcified material from the medial wound edge. 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 Otherwise the wound appears essentially unchanged there is less adherent surface eschar. Some of the calcified material was sent to pathology for analysis 11/17/15. The patient's wound on the right lateral leg is essentially unchanged. Wider Medially. 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 There is some surface slough but I did not debridement this. No evidence of surrounding infection. 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 This will make healing difficult 12/15/15; the wound has less depth surface has a fibrinous slough and calcifications in the wound edge no evidence of infection 12/22/15; the wound definitely has less Fibrinous slough on the base. Calcifications around the wound edges are still evident. Although the wound bed looks healthier it is still pale in appearance. Previous biopsy did not show malignancy 01/04/15; surgical debridement of nonviable slough and subcutaneous tissue the wound cleans up quite nicely but appears to be expanding outward calcifications around the wound edges are still there. 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 She is to see her rheumatologist I'll see if he has any opinions on this. My punch biopsy done in September did not show calcifications although these are clearly evident. 01/19/16 light selective debridement of nonviable surface slough. There is epithelialization medially. This gives me reason for cautious optimism. She has been to see her rheumatologist, there is nothing that can be done for this type of soft tissue calcification associated with scleroderma 02/02/16 no debridement although there is a light surface slough. She has 2 peninsulas of skin 1 inferiorly and one medially. We continue to make a slight and slow but definite progressive here 02/16/16; light surface debridement with more attention to the circumference of the wound bed where the fibrinous eschar is more prevalent. No calcifications detected. She seems to have done nicely with the [facility] with some epithelialization and some improvement in the overall wound volume. 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 Continued attention to the circumference of the wound where the fibrinous eschar in calcinosis or prevalent. No calcifications were detected. She has continued improvement with Hydrofera Blue. The wound is no longer as deep 03/15/16 surgical debridement done to remove surface escha Especially around the circumference of the wound where there is nonviable subcutaneous tissue. In spite of this there is considerable improvement in the overall dimensions and depth of the wound. Islands of epithelialization are seen especially medially inferiorly and superiorly to a lesser extent. She is using Hydrofera Blue at home 03/29/16; surgical debridement done to remove surface eschar and nonviable subcutaneous tissue. This cleans up quite nicely mention slightly larger in terms of length and width however depth is less 04/12/16; continued gradual improvement in terms of depth and the condition of the wound base. Debridement is done. Continuing long standing Hydrofera Blue at home with Kerlix Coban wraps 04/26/16; continued gradual improvement in terms of depth and management as well as condition of the wound base. Surgical debridement done she continues with Hydrofera Blue. 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 She initially came to this clinic venous insufficiency ulcers which have long since healed 05/17/16 continued improvement in terms of the depth and measurements of this wound although she has a tightly adherent fibrinous slough each time. We've been continued with long standing Hydrofera Blue which seems to done as well for this wound is many advanced treatment  options. 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 She also has chronic venous insufficiency. She has an irritation on her lateral right  ankle secondary to our wraps 05/10/16; wound appears to be smaller especially on the medial aspect and especially in the width. Wound was debridement surface looks better. She is also been in the hospital apparently with anemia again she tells me she had an endoscopy. Since she got home after 3 days which I believe was sometime last week she has had an irritated painful area on the right lateral ankle surrounding the lateral malleolus 05/31/16; much more adherent surface slough today then recently although I don't think the dimensions of changed that much. A more aggressive debridement is required. The irritated area over her medial malleolus is more pruritic and painful and I don't think represents cellulitis 06/14/16; no major change in her wound dimensions however there is more tightly adherent surface slough which is disappointing. As well as she appears to have a new small area medially. Furthermore an irritated uncomfortable area on the lateral aspect of her right foot just below the lateral malleolus. 06/21/16; I'm seeing the patient back in follow-up for the new areas under her major wound on the right anterior leg. She has been using Hydrofera Blue to this area probably for several months now and although the dressings seem to be helping for quite a period of time I think things have stagnated lately. She comes in today with a relatively tight adherent surface slough and really no changes in the wound shape or dimension. The 2 small areas she had inferiorly are tiny but still open they seem improved this well. There is no uncontrolled edema and I don't think there is any evidence of cellulitis. 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 Patient has had vascular evaluations both venous and arterial. I have biopsied this area. There is no obvious infection. The worrisome thing today is that  she seems to be developing areas of erythema and epithelial damage on the medial aspect of the right foot. Also to a lesser degree inferior to the actual wound itself. Again I see no obvious changes to suggest cellulitis however as this is the only treatable option I will probably give her antibiotics. 07/13/16 no major change in the lady's large anterior right leg wound. Still covered with a very tightly adherent surface slough which is difficult to debridement. There is less erythema around this, culture last week grew pseudomonas I gave her ciprofloxacin. The area on her lateral right malleolus looks better- 08/02/16 the patient's wounds continued to decline. Her original large anterior right leg wound looks deeper. Still adherent surface slough that is difficult to debridement. She has a small area just below this and a punched-out wound just below her lateral malleolus. In the meantime she is been in hospital with apparently an upper GI bleed on Plavix and aspirin. She is now just on Plavix she received 3 units of packed cells 08/23/16; since I last saw this 3 weeks ago, the open large area on her right leg looks about the same syrup. She has a small satellite lesion just underneath this. The area on her medial right ankle is now a deep necrotic wound. I attempted to debridement this however there is just too much pain. It is difficult to feel her peripheral pulses however I think a lot of  this may be vasospasm and micro-calcinosis. She follows with vascular surgery and is scheduled for an angiogram in early September 09/06/16; the patient is going for an arteriogram tomorrow. Her original large wound on the right calf is about the same the satellite lesion underneath it is about the same however the area on her medial ankle is now deeper with exposed tendon. I am no longer attempting to debride these wounds 09/20/16; the patient has undergone a right femoral endarterectomy and Dacron patch angioplasty.  This seems to have helped the flow in her right leg. 10/04/16; Arrived today for aggressive debridement of the wounds on her right calf the original wound the one beneath it and a difficult area over her right lateral leg just above the lateral malleolus. 10/25/16; her 3 open wounds are about the same in terms of dimensions however the surface appears a lot healthier post debridement. Using Iodoflex 10/18/16 we have been using Iodoflex to her wounds which she tolerated with some difficulty. 10/11/16; has been using Santyl for a period of time with some improvement although again very adherent surface slough would prevent any attempted healing this. She has a original wound on the left calf, the satellite underneath that and the most recent wound on the right medial ankle. She has completed revascularization by Dr. early and has had venous ablation earlier. Want to go back to Iodoflex to see if week and get a healthier surface to this wound bed failing this I think she'll need to be taken to the OR and I am prepared to call Dr. Marla Roe to discuss this. She is obviously not a good candidate for general anesthesia however.; 11/08/16; I put her on Iodoflex last time to see if I can get the wound bed any healthier and unfortunately today still had tremendous surface slough. 11/15/16; 4 weeks' worth of Iodoflex with not much improvement. Debridement on the major wounds on her left anterior leg is easier however this does not maintain from week to week. The punched-out area on her medial right ankle 11/29/16; I attempted to change the patient last visit to [facility] however she states this burned and was very uncomfortable therefore we gave her permission to go back to the eye out of complex which she already had at home. Also she noted a lot of pain and swelling on the lateral aspect of her leg before she traveled to [facility] for the holidays, I called her in doxycycline over the  phone. This seems to have helped 12/06/16; Wounds unchanged by in large. Using Iodoflex 12/13/16; her wounds today actually looks somewhat better. The area on the right lateral lower leg has reasonably healthy-looking granulation and perhaps as actually filled and a bit. Debridement of the 2 wounds on the medial calf is easier and post debridement appears to have a healthier base. We have referred her to Dr. Migdalia Dk for consideration of operative debridement 12/20/16; we have a quick note from Dr. Merri Ray who feels that the patient needs to be referred to an academic center/plastic surgeon. This is due to the complexity of the patient's medical issues as it applies to anything in the OR. We have been using Iodoflex 12/27/16; in general the wounds on the right leg are better in terms of the difficult to remove surface slough. She has been using Iodoflex. She is approved for Apligraf which I anticipated ordering in the next week or 2 when we get a better-looking surface 01/04/16 the deep wounds on the right leg  generally look better. Both of them are debrided further surface slough. The area on the lateral right leg was not debrided. She is approved for Apligraf I think I'll probably order this either next week or the week after depending on the surface of the wounds superiorly. We have been using Iodoflex which will continue until then 01/11/16; the deep wounds on the right leg again have a surface slough that requires debridement. I've not been able to get the wound bed on either one of these wounds down to what would be acceptable for an advanced skin stab-like Apligraf. The area on the medial leg has been improving. We have been using hot Iodoflex to all wounds which seems to do the best at at least limiting the nonviable surface 01/24/17; we have continued Iodoflex and all her wound areas. Her debridement Gen. he is easier and the surface underneath this looks viable. Nevertheless these are  large area wounds with exposed muscle at least on the anterior parts. We have ordered Apligraf's for 2 weeks from now. The patient will be away next week 02/07/17; the patient was close to have first Apligraf today however we did not order one. I therefore replaced her Iodoflex. She essentially has 3 large punched- out areas on her right anterior leg and right medial ankle. 02/11/17; Apligraf #1 02/25/17; Apligraf #2. In general some improvement in the right medial ankle and right anterior leg wounds. The larger wound medially perhaps some better 03/11/17; Apligraf #3. In general continued improvement in the right medial ankle and the right anterior leg wounds. 03/25/17 Apligraf #4. In general continued improvement especially on the right medial ankle and the lower 04/08/17; Apligraf #5 in general continued improvement in all wound sites. 04/22/17; post Apligraf #5 her wound beds continued to look a lot better all of them up to the surface of the surrounding skin. Had a caramel-colored slough that I did not debrided in case there is residual Apligraf effect. The wounds are as good as I have seen these looking quite some time. 04/29/17; we applied [place] and last week after completing her Apligraf. Wounds look as though they've contracted somewhat although they have a nonviable surface which was problematic in the past. Apparently she has been approved for further Apligraf's. We applied Iodosorb today after debridement. 05/06/17; we're fortunate enough today to be able to apply additional Apligraf approved by her insurance. In general all of her wounds look better Apligraf #6 05/24/17; Apligraf #7 continued improvement in all wounds o3 06/10/17 Apligraf #8. Continued improvement in the surface of all wounds. Not much of an improvement in dimensions as I might a follow 06/24/17 Apligraf #9 continued general improvement although not as much change in the wound areas I might of like. She has a new open area on  the right anterior lateral ankle very small and superficial. She also has a necrotic wound on the tip of her right index finger probably secondary to severe uncontrolled Raynaud's phenomenon. She is already on sildenafil and already seen her rheumatologist who gave her Keflex. 07/08/17; Apligraf #10. Generalized improvement although she has a small additional wound just medial to the major wound area. 07/22/17; after discussion we decided not to reorder any further Apligraf's although there is been considerable improvement with these it hasn't been recently. The major wound anteriorly looks better. Smaller wounds beneath this and the more recent one and laterally look about the same. The area on the right lateral lower leg looks about the same 07/29/17  this is a patient who is exceedingly complex. She has advanced scleroderma, crest syndrome including calcinosis, PAD status post revascularization, chronic venous insufficiency status post ablations. She initially presented to this clinic with wounds on her bilateral lower legs however these closed. More recently we have been dealing with a large open area superiorly on the right anterior leg, a smaller wound underneath this and then another one on the right medial lower extremity. These improve significantly with 10 Apligraf applications. Over the last 2-3 weeks we are making good progress with Hydrofera Blue and these seem to be making progress towards closure 08/19/17; wounds continued to make good improvement with Hydrofera Blue and episodic aggressive debridements 08/26/17; still using Hydrofera Blue. Good improvements 09/09/17; using Hydrofera Blue continued improvement. Area on the lateral part of her right leg has only a small remaining open area. The small inferior satellite region is for all intents and purposes closed. Her major wound also is come in in terms of depth and has advancing epithelialization. 09/16/17; using Hydrofera Blue with continued  improvement. The smaller satellite wound. We've closed out today along with a new were satellite wound medially. The area on her medial ankle is still open and her major wound is still open but making improvement. All using Hydrofera Blue. Currently 09/30/17; using Hydrofera Blue. Still a small open area on the lateral right ankle area and her original major wound seems to be making gradual and steady improvement. 10/14/17; still using Hydrofera Blue. Still too small open areas on the right lateral ankle. Her original major wound is horizontal and linear. The most problematic area paradoxically seems to be the area to the medial area wears I thought it would be the lateral. 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 10/28/17; still using Hydrofera Blue. Right lateral ankle has a very small open area superiorly on the most lateral part of the wound. Her original open wound has 2 open areas now separated by normal skin and we've redefined this. 11/11/17; still using Hydrofera Blue area and right lateral ankle continues to have a small open area on mostly the lateral part of the wound. Her original wound has 2 small open areas now separated by a considerable amount of normal skin 11/28/17; the patient called in slightly before Thanksgiving to report pain and erythema above the wound on the right leg. In the past this is responded well to treatment for cellulitis and I gave her over the phone doxycycline. She stated this resulted in fairly abrupt improvement. We have been using Hydrofera Blue for a prolonged period of time to the larger wound anteriorly into the remaining wound on her right right lateral ankle. The latter is just about closed with only a small linear area and the bottom of the [place]. 12/02/17; use endoform and left the dressing on since last visit. There is no tenderness and no evidence of infection. 12/16/17; patient has been using  Endoform but not making much progress. The 2 punched out open areas anteriorly which were the reminiscence of her major wound appear deeper. The area on the lateral aspect of her right calf also appears deeper. Also she has a puzzling tender swelling above her wound on the right leg. This seems larger than last time. Just above her wounds there appears to be some fluctuance in this area it is not erythematous and there is no crepitus 12/30/17; patient has been using Endoform up until last week we used Hydrofera  Blue. Ultrasound of the swelling above her 2 major wounds last time was negative for a fluid collection. I gave her cefaclor for the erythema and tenderness in this area which seems better. Unfortunately both punched out areas anteriorly and the area on her right medial lower leg appear deeper. In fact the lateral of the wounds anteriorly actually looks as though it has exposed tendon and/or muscle sheath. She is not systemically unwell. She is complaining of vaginitis type symptoms presumably Candida from her antibiotics. 01/06/18; we're using santyl. she has 2 punched out areas anteriorly which were initially part of a large wound. Unfortunately medially this is now open to tendon/muscle. All the wounds have the same adherent very difficult to debride surface. 01/20/18; 2 week follow-up using Santyl. She has the 2 punched out areas anteriorly which were initially part of her large surface wound there. Medially this still has exposed muscle. All of these have the same tightly adherent necrotic surface which requires debridement. PuraPly was not accepted by the patient's insurance however her insurance I think it changed therefore we are going to run Apligraf to gain 02/03/18; the patient has been using Santyl. The wound on the right lateral ankle looks improved and the 2 areas anteriorly on the right leg looks about the same. The medial one has exposed muscle. The lateral 1 requiresdebridement. We  use PuraPly today for the first week 02/10/18; PuraPly #2. The patient has 3 wounds. The area on the right lateral ankle, 2 areas anteriorly that were part of her original large wound in this area the medial one has exposed muscle. All of the wounds were lightly debrided with a number 3 curet. PuraPly #2 applied the lateral wound on the calf and the right lateral ankle look better. 02/17/18; PuraPly #3. Patient has 3 wounds. The area on the right lateral ankle in 2 areas internally that were part of her original large wound. The lateral area has exposed muscle. She arrived with some complaints of pain around the right ankle. 02/24/18; PuraPly #4; not much change in any of the 3 wound areas. Right lateral ankle, right lateral calf. Both of these required debridement with a #3 curet. She tolerates this marginally. The area on the medial leg still has exposed muscle. Not much change in dimensions 03/03/18 PuraPly #5. The area on the medial ankle actually looks better however the 2 separate areas that were original parts of the larger right anterior leg wound look as though they're attempting to coalesce. 03/10/18; PuraPly #6. The area on the medial ankle actually continues to look and measures smaller however the 2 separate areas that were part of the original large wound on the right anterior leg have now coalesced. There hasn't been much improvement here. The lateral area actually has underlying exposed muscle 03/17/18-she is here in follow-up evaluation for ulcerations to the right lower extremity. She is voicing no complaints or concerns. She tolerated debridement. Puraply#7 placed 03/24/18; difficult right lower extremity ulcerations. PuraPly #8 place. She is been approved for [employer]. She did very well with Apligraf today however she is apparently reached her "lifetime max" 03/31/18; marginal improvement with PuraPly although her wounds looked as good as they have in several weeks today. Used TheraSkins  #1 04/14/18 TheraSkin #2 today 04/28/18 TheraSkins #3. Wound slightly improved 05/12/18; TheraSkin skin #4. Wound response has been variable. 05/27/18 TheraSkin #5. Generally improvement in all wound areas. I've also put her in 3 layer compression to help with the severe venous hypertension 06/09/18;  patient is done quite well with the TheraSkins unfortunately we have no further applications. I also put her in 3 layer compression last week and that really seems to of helped. 06/16/18; we have been using silver collagen. Wounds are smaller. Still be open area to the muscle layer of her calf however even that is contracted somewhat. She tells me that at night sometimes she has pain on the right lateral calf at the site of her lower wound. Notable that I put her into 3 layer compression about 3 weeks ago. She states that she dangles her leg over the bed that makes it feel better but she does not describe claudication during the day ooShe is going to call her secondary insurance to see if they will continue to cover advanced treatment products I have reviewed her arterial studies from 01/22/17; this showed an ABI in the right of 1 and on the left noncompressible. TBI on the right at 0.30 on the left at 0.34. It is therefore possible she has significant PAD with medial calcification falsely elevating her ABI into the normal range. I'll need to be careful about asking her about this next week it's possible the 3 layer compression is too much 06/23/18; was able to reapply TheraSkin 1 today. Edema control is good and she is not complaining of pain no claudication 07/07/18;no major change. New wound which was apparently a taper removal injury today in our clinic between her 2 wounds on the right calf TheraSkins #2 07/14/18; I think there is some improvement in the right lateral ankle and the medial part of her wound. There is still exposed muscle medially. 07/28/18; two-week follow-up. TheraSkins#3. Unfortunately no  major change. She is not a candidate I don't think for skin grafting due to severe venous hypertension associated with her scleroderma and pulmonary hypertension 08/11/18 Patient is here today for her [employer] application #37 (#5 of the second set). She seems to be doing well and in the base of the wound appears to show some progress at this point. This is the last approved [employer] of the second set. 08/25/18; she has completed TheraSkin. There has been some improvement on the right lateral calf wound as well as the anterior leg wounds. The open area to muscle medially on the anterior leg wound is smaller. I'm going to transition her back to [facility] under Kerlix Coban change every second day. She reports that she had some calcification removed from her right upper arm. We have had previous problems with calcifications in her wounds on her legs but that has not happened recently 09/08/18;using Hydrofera Blue on both her wound areas. Wounds seem to of contracted nicely. She uses Kerlix Coban wrap and changes at home herself 09/22/2018; using Hydrofera Blue on both her wound areas. Dimensions seem to have come down somewhat. There is certainly less depth in the medial part of the mid tibia wound and I do not think there is any exposed muscle at this point. 10/06/2018; 2-week follow-up. Using [facility] on her wound areas. Dimensions have come down nicely both on the right lateral ankle area in the right mid tibial area. She has no new complaints 10/20/2018; 2-week follow-up. She is using Hydrofera Blue. Not much change from the last time she was here. The area on the lateral ankle has less depth although it has raised edges on one side. I attempted to remove as much of the raised edge as I could without creating more additional wounds. The area on the  right anterior mid tibia area looks the same. 11/03/2018; 2-week follow-up she is using Hydrofera Blue. On the right anterior leg she now has 2  wounds separated by a large area of normal skin. The area on the medial part still has I think exposed muscle although this area itself is a lot smaller. The area laterally has some depth. Both areas with necrotic debris. ooThe area on her right lateral ankle has come in nicely 11/17/2018; patient continues to use Hydrofera Blue. We have been increasing separation of the 2 wounds anteriorly which were at one point joint the area on the right lateral calf continues to have I think some improvement in depth. 12/08/2018; patient continues to use Hydrofera Blue. There is some improvement in the area on the right lateral calf. The 2 areas that were initially part of the original large wound in the mid right tibia are probably about the same. In fact the medial area is probably somewhat larger. We will run puraply through the patient's insurance 12/22/2018; she has been using Hydrofera Blue. We have small wounds on the right lateral calf and 2 small areas that were initially part of a large wound in the right mid tibia. We applied pure apply #1 today. 12/29/2018; we applied puraply #2. Her wounds look somewhat better especially on the right lateral calf and the lateral part of her original wound in the mid tibial area. 01/05/2019; perhaps slightly improved in terms of wound bed condition but certainly not as much improvement as I might of liked. Puraply #3 1/13: we did not have a correct sized puraply to apply. wounds more pinched out looking, I increased her compression to 3 layer last week to help with significant multilevel venous hypertension. Since then I've reviewed her arterial status. She has a right femoral endarterectomy and a distal left SFA stent. She was being followed by Dr. [name] for a period however she does not appear to have seen him in 3 years. I will set up an appointment. 1/20. The patient has an additional wound on the right lateral calf between the distal wound and proximal wounds. We  did not have Puraply last week. Still does not have a follow-up with Dr. early 1/27: Follow-up with Dr. early has been arranged apparently with follow-up noninvasive studies. Wounds are measuring roughly the same although they certainly look smaller 2/3; the patient had non invasive studies. Her ABIs on the right were 0.83 and on the left 1.02 however there was no great toe pressure bilaterally. Also worrisome monophasic waveforms at the PTA and dorsalis pedis. We are still using Puraply. We have had some improvement in all of the wounds especially the lateral part of the mid tibial area. 2/10; sees Dr. early of vein and vascular re-arterial studies next week. Puraply reapplied today. 2/17; Dr. early of vein and vascular his appointment is tomorrow. Puraply reapplied after debridement of all wounds 2/24; the patient saw Dr. early I reviewed his note. sHe noted the previous right femoral endarterectomy with a Dacron patch. He also noted the normal ABI and the monophasic waveforms suggesting tibial disease. Overall he did not feel that she had any evidence of arterial insufficiency that would impair her wound healing. He did note her venous disease as well. He suggested PRN follow-up. 3/2; I had the last puraply applied today. The original wounds over the mid tibia area are improved where is the area on the right lower leg is not 3/9; wounds are smaller especially in the right  mid tibia perhaps slightly in the right lateral calf. We finished with puraply and went to endoform today 3/23; the patient arrives after 2 weeks. She has been using endoform. I think all of her wounds look slightly better which includes the area on the right lateral calf just above the right lateral malleolus and the 2 in the right mid tibia which were initially part of the same wound. 4/27; TELEHEALTH visit; the patient was seen for telehealth visit today with her consent in the middle of the worldwide epidemic. Since she was  last here she called in for antibiotics with pain and tenderness around the area on the right medial ankle. I gave her empiric doxycycline. She states this feels better. She is using endoform on both of these areas 5/11 TELEHEALTH; the patient was seen for telehealth visit today. She was accompanied at home by her husband. She has severe pulmonary hypertension accompanied scleroderma and in the face of the Covid epidemic cannot be safely brought into our clinic. We have been using endoform on her wound areas. There are essentially 3 wound areas now in the left mid tibia now 2 open areas that it one point were connected and one on the right lateral ankle just above the malleolus. 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 She cannot come to clinic. The wounds were reviewed today via telehealth. She has severe chronic venous hypertension which I think is centrally mediated. She has wounds on her right anterior tibia and right lateral ankle area. These are chronic. She has been using endoform. 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 We have been following her from telehealth. She has severe chronic venous hypertension which may be mostly centrally mediated secondary to right heart heart failure. She has wounds on her right anterior tibia and right lateral ankle these are chronic we have been using endoform 6/22; TELEHEALTH; this patient was seen today via telehealth. 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 Since the last time we had contact with this patient she developed some pain and erythema around the  wound on her right lateral malleolus/ankle and we put in antibiotics for her. This is resulted in good improvement with resolution of the erythema and tenderness. I changed her to silver alginate last time. We had been using endoform for an extended period of time 7/13; TELEHEALTH; this patient was seen today via telehealth. 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 She would continue to be at a prohibitive risk to be brought into our clinic unless this was absolutely necessary. These visits have been done with her approval as well as her husband. We have been using silver alginate to the areas on the right mid tibia and right lateral lower leg. 7/27 TELEHEALTH; patient was seen along with her husband today via telehealth. 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 We changed her to sample last visit. She has 2 areas a chronic wound on her right mid tibia and one just above her ankle. These were not the original  wounds when she came into this clinic but she developed them during treatment 8/17; she comes in for her first face-to-face visit in a long period. She has a remaining area just medial to the right tibia which is the last open part of her large wound across this area. She also has an area on the right lateral lower leg. We prescribed Santyl last telehealth visit but they were concerned that this was making a deeper so they put silver alginate on it last week. Her husband changes the dressings. 8/31; using Santyl to the 2 wound areas some improvement in wound surfaces. Husband has surgery in 2 weeks we will put her out 3 weeks. Any of the advanced treatment options that I can think of that would be eligible for this wound would also cause her to have to come in weekly. The risk that the patient is just too high 9/21. Using Santyl to the 2 wound areas. Both of these  are somewhat better although the medial mid tibia area still has exposed muscle. Lateral ankle requiring debridement. Using Santyl 10/12; using Santyl to the 2 wound areas. One on the right lateral ankle and the other in the medial calf which still has exposed muscle. Both areas have come down in size and have better looking surfaces. She has made nice progress with santyl 11/2; we have been using Santyl to the 2 wound areas. Right lateral ankle and the other in the mid tibia area the medial part of this still has exposed muscle. 11/23/2019 on evaluation today patient appears to be doing about the same really with regard to her wounds. She is actually not very pleased with how things seem to be progressing at this point she tells me that she really has not noted much improvement unfortunately. With that being said there is no signs of active infection at this time. There is some slough buildup noted at this time which again along with some dry skin around the edges of the wound I think would benefit her to try to debride some of this away. Fortunately her pain is doing fairly well. She still has exposed muscle in the right medial/tibial area. 12/14; TELEHEALTH; she was changed to [facility] to the right calf wound and right lateral ankle wound when she was here last time. Unfortunately since then she had a fall with a pelvic fracture and a fracture of her wrist. She was apparently hospitalized for 5 days. I have not looked at her discharge summary. She apparently came out of the hospital with a blister on her right heel. She was seen today via telehealth by myself and our case manager. The patient and her husband were present. She has been using Hydrofera Blue at our direction from the last time she was in the clinic. There is been no major improvement in fact the areas appear deeper and with a less viable surface 01/04/2020; TELEHEALTH; the patient was seen today in accompaniment of her husband and  our nurse. She has 3 open areas 1 on the right medial mid tibia, one on the right lateral ankle and a large eschared pressure area on the right heel. We have been using Iodoflex to the 2 original wounds. 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 It is simply too perilous for her to be seen in any other way 1/26; TELEHEALTH; the patient was seen today in accompaniment of her husband and our RN one of our nurses. She still has the 3  open areas 1 on the right medial mid tibia which is the remanent of a more extensive wound in this area, one on the right lateral ankle and a large eschared pressure area on the right heel. We have been using Iodoflex to the 2 original wounds and a bed at 9 application to the eschared area on the heel 2/15; the first time we have seen this patient and then several months out of concern for the pandemic. She had a large horizontal wound in the mid tibia. Only the medial aspect of this is still open. Area on the lateral ankle is just about closed. She had a new pressure ulcer on the right heel which I have removed some of the eschar. We have been using Iodoflex which I will continue. The area in the mid tibia has a round circle in the middle of exposed muscle. I think we would have to use an advanced treatment product to stimulate granulation over this area. We will run this through her insurance. She is not eligible for plastic surgery for many reasons 3/1; TELEHEALTH. The patient was seen today by telehealth. 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 We have been using Iodoflex to the wound areas which include the right anterior mid tibia, right lateral ankle and the right heel. All of these were reviewed 3/8; the patient's wound just above the right ankle is closed. She still has a contracting black eschar on the heel where she had a pressure  ulcer. The medial part of her original wound on the mid tibia has exposed muscle. We have made really made no progress in this area although we have managed to get a lot of the original wound in this area to close. We used Apligraf #1 today 3/22; the patient's ankle wound remains closed. She still has a contracting black eschar on the heel although it seems to have less surface area. It still not clear whether there is any depth here. We used Apligraf #2 today in an attempt to get granulation over the exposed muscle and what is left of her mid tibia area. 4/5; everything is closed except the medial aspect of the mid tibia wound, as well as the pressure injury on the right heel. She has been using Betadine to the right heel which has been gradually contracting. We used Apligraf #3 today. 4/19; Apligraf #4. Still a pressure area on the right heel she has been using Betadine superficial excoriated areas she has been applying salicylic acid to on the anterior leg and the thick horn on the leg just above her wound area 5/3; Apligraf #5. Unfortunately she came in today with a reopening of the area on the right lateral lower leg. Not much change in the wound we have been treating in the mid calf. Quite a bit of edema surrounding this wound. She reports that she was up on her feet quite a bit. The area on the right heel is separating she has been using Betadine She comes in with a new wound on the left lateral malleolus which is very disappointing this is been open since last week 5/17; we applied her last Apligraf last time. Unfortunately that did not have any effect on the deep area medially in the mid tibial area. However the area over the right lateral malleolus is a lot better. Right heel also is contracted we used Iodoflex last time. The new wound on  the left lateral malleolus were using Santyl to. This required debridement. 6/1. Not much change in the area on the right medial mid tibia. Right  lateral ankle is improved she has the necrotic area on the right heel which was a pressure area. New area on the left lateral malleolus from last time. I changed her to Iodoflex to the area on the left lateral malleolus she said this hurt I have put her back on Santyl 6/15; right mid tibia is unchanged. I do not think there is anything I can do to this topically that we will get this to granulate over the muscle. And I am furthermore I am not sure that she is a surgical candidate either because of her severe pulmonary issues or because she really does not want to go through it. ooThe area on the right lateral malleolus is a small punched out area. ooThe area on the left lateral malleolus again small punched out with nonviable surface ooPressure ulcer on the right heel at separating black eschar. I went ahead and remove this today Lura Em has an area on the left anterior mid tibia just laterally. Geographic wounds debris on the surface. Some erythema here. 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 There is skin changes widely Change the primary dressing to Sorbact to all wound areas. She needs compression on the left leg as well as the right 6/21; right mid tibia which was her one remaining wound at 1 point is unchanged ooThe area on the right lateral malleolus actually is close to closing over still a small punched out area ooThe area on the left lateral malleolus again a deep wound it is this 1 that she feels pain in. ooPressure ulcer on the right heel was cultured today. ooNew area on the left anterior mid tibia several geographic wounds last week in the setting of chronic venous insufficiency this actually looks some better 7/6; ooRight mid tibia about the same. Exposed muscle ooLeft lateral malleolus again necrotic debris over the surface. ooPressure ulcer on the right heel. She finished the Keflex we prescribed. Necrotic debris debrided with a #3  curette ooThe rest of her wounds on the right mid tibia right lateral ankle are better Her husband reminds me that she had a right femoral endarterectomy and has 2 stents in her left thigh placed in 2011 approximately by vascular surgery in [employer]. She saw Dr. [name] last in February 2020. At that point he did not think that the arterial insufficiency she had on the right was sufficient to explain any of her right lower extremity wounds however she now has an area on the left lateral malleolus that looks like an ischemic wound 7/12; we have been using Sorbact all wound areas ooRight mid tibia about the same exposed muscle ooLeft lateral malleolus small wound with debris in the surface punched out ooPressure ulcer of the right heel requiring debridement of necrotic debris ooLateral ankle wound on the right is just about closed ooRight mid tibia wound about the same still with exposed muscle. 7/26 really no change in any wounds. We have been using Sorbact. She has bilateral lower extremity wounds. This is in the setting of scleroderma, severe venous hypertension. She also has known PAD and I had really hoped to be able to get a review by Dr. [name] but apparently that is not booked until August 31. I have asked her to call back to vein and vascular and get an appointment with somebody a little earlier if  that is possible. 8/16; patient's area on the right ankle which was a chronic wound is closed over again. The area on the right mid lower leg, right heel left lateral ankle are all about the same. Pressure ulcer on the right heel is still not viable. New areas identified on the right lateral calf. She has a follow-up with Dr. [name] on 8/31. Phenomenally I would like him to go over her arterial status with regards to her underlying stents. 9/13; patient had her ARTERIAL studies done however apparently Dr. [name] is now in [place] so she did not get an appointment with him on 8/31 and was  not referred to any other provider. On the right side her PTA was monophasic. Noncompressible. TBI on the right at 0.41 on the left she was monophasic and noncompressible they did not do a TBI because she had a Band-Aid on her big toe. Patient does not describe claudication although she is having a lot of pain in the left lateral malleolus. 10/11; the patient had a right-sided interventions on 10/5. She had a angioplasty of the right anterior tibial artery as well as the tibioperoneal trunk/peroneal artery she had a drug-coated balloon angioplasty of the right superficial femoral artery. On the left she did not have any interventions yet but is going to have another look at this in 2 weeks. The superficial femoral artery on the left and the associated stent are patent popliteal artery is patent to the dominant vessel runoff is the anterior tibial which has several high-grade stenosis in the proximal one third. We have been using Sorbact. She also has a new area on the right upper mid tibia. 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 She tolerated this well. She is using Iodoflex and her husband is doing a kerlix Coban on her. She arrives in clinic today with the original medial part of her mid tibia wound with muscle exposure small area on the right ankle which was part of her original wound she has 2 punched-out areas laterally and 1 above her area anteriorly. Nonviable surfaces similarly the right heel is nonviable. On the left side she has an area on the left foot and the left lateral ankle similar nonviable surfaces. 11/8; patient arrives in clinic today with nothing improved. She has a multiple lightly group of wounds on the right lower leg  presumably different etiologies including a skin biopsy, the original wound she had medially and some punched-out areas that are not of clear defined etiology. She says she was in a lot of pain this week could not have her leg up on the bed did not get completely relief from dropping it over the bed. She also has a small area on the left lateral malleolus and the left lateral foot necrotic surfaces on all of these wounds. We have been using Iodoflex I attempted to put her in 3 layer compression last week that did not go well. We will be backing off on kerlix Coban this week. 11/15; the ultrasound that I sent the patient for last week showed findings consistent with acute deep vein thrombosis involving the right distal femoral vein and right popliteal vein. I was concerned about the combination of Plavix and Xarelto however apparently Dr. [name] felt that was not  a problem. She is now going through the acute dosing of Xarelto. She has an appointment with Dr. Trula Slade on 12/5. 11/29; 10 days ago before he left for weeks vacation she called to report pain in the right leg I sent her in some doxycycline her husband thought things were getting better however she required admission to hospital from 11/22 through 11/24 prompted by finding that her hemoglobin was 5.7 she was transfused 2 units of packed cells and her hemoglobin stabilized. She did undergo an endoscopy with findings of significant esophagitis part of which was candidal and part of it reflux. She was discharged on nystatin swish and swallow which she is doing. She seems to be better from that regard. There was no active other active source of bleeding Unfortunately the combination of Xarelto and Plavix was felt to be too much for her. The Xarelto was stopped and an IVC filter was placed she is continued on Plavix. Her big complaint today is pain in the right calf really from the mid aspect superiorly. This is very tender slightly red but not  particularly warm. She did see vein and vascular in the hospital I will see if I can pull their note 12/6; patient was at Dr. Stephens Shire office today. Great toe pressure on the right is 54 on the left 49 she has a 50 to 74% stenosis of the right mid popliteal on the left at 30 to 49% proximal SFA and a 50 to 74% CFA. She was treated with angioplasty of the right ATA, right TP trunk and peroneal artery as well as an angioplasty of the right SFA to popliteal artery. On 10/26 she underwent angioplasty of the left ATA and left TP trunk she has a history of a left common femoral endarterectomy and left superficial femoral artery stent as well as a right superficial femoral artery stent. In addition to her arterial disease she has significant venous disease with a history of a right greater saphenous vein ablation and a recent DVT in the right femoral and popliteal vein with insertion of an IVC filter. She has remained on Plavix but her anticoagulant was stopped. She continues to have a lot of swelling and pain in the right leg with significant erythema which I am assuming is stasis dermatitis. She has had substantial wound areas on the right leg including right lateral a large necrotic wound with a smaller biopsy site above this. She has original wound on the left mid tibia with a small area above this as well. On the left she has a small area over the left lateral malleolus and the left lateral foot 12/22-week follow-up. Her right leg looks really terrible. She has another necrotic wound just above her original one on the right posterior leg. Greenish drainage suggestive of gram-negative/Pseudomonas infection. The entire mid part of her leg is erythematous I think most of this is stasis dermatitis rather than cellulitis. She has also a bothersome ischemic-looking right fourth toe which I noticed today without her really complaining. She has not been systemically unwell although she looks more physically  frail 12/27; I reached out to vein and vascular last weekend Dr. [name] will be seeing her on Thursday. She has a gangrenous area on the tip of the right fourth toe. Multiple areas breaking down. I think this is a combination of arterial and severe venous insufficiency she has multiple wounds in the right leg but not a lot of pain. She has 3 small areas on the left leg we  have been using Santyl. On the right leg we have been using silver alginate or at least that is what they have been using Culture I did last week showed abundant is Pseudomonas aeruginosa had a few Enterococcus faecalis. I have been using Ceftin here predominantly because of the green drainage suggesting Pseudomonas I have not yet addressed the Enterococcus which was ampicillin sensitive 1/7; the patient was kindly seen by vein and vascular. She underwent a repeat ANGIOGRAM on 01/02/2021 by Dr. Donzetta Matters. She appears to have had bilateral common femoral treatments in the past. The right side is heavily heavily calcified and she has 1 area of 50% stenosis in the SFA that is nonflow limiting. She has dominant runoff via the anterior tibial and peroneal arteries. On the left side she also has a heavily calcified SFA no flow-limiting stenosis in the popliteal artery then she she then has runoff via the anterior tibial and posterior tibial arteries without flow-limiting stenosis. She was not felt to require any intervention. She continues to have pain in the right leg with erythema although the erythema is somewhat better. Green drainage coming from the wounds on the posterior lateral right leg which are clearly necrotic. I have had her on 2-1/2 weeks of cefdinir this is while we awaited further arterial evaluation which is completed. I have now turned some thought to the status of her venous system particularly the DVT she had in November. This was an acute DVT involving the right distal femoral and right popliteal veins. She was then started on  Xarelto in combination with Plavix but she required hospitalization in November with GI bleeding. She had an endoscopy by Dr. Lizbeth Bark of GI. This showed moderately severe esophagitis a combination of both food particles reflux and Candida. Localized mild inflammation characterized by erythema was found in the cardiac gastric fundus and the gastric body. The duodenal bulb's in the stages of the duodenum were normal. She then underwent a capsule endoscopy which showed evidence of oozing blood from the esophagus but no evidence of small bowel bleeding. She was not restarted on her Xarelto, she continues on Plavix and she has an IVC filter 01/12/2021; we brought this patient in for a nurse visit today however I saw her because of copious amounts of green drainage. Under 3 layer compression the degree of erythema in her lower leg on the right was better however there is indeed large amounts of green drainage I think coming out of the large wound on the posterior lateral leg. There is also a satellite area above this. She complains of fairly constant pain in fact she called our clinic earlier in the week to report that Sorbact which we are using topically was hurting her. We explained that we had never heard Sorbact do this. She has not been systemically unwell. My thoughts are on this lady that she had recent angiograms and there is nothing further that can be done. She has a gangrenous right fourth toe but there should be enough blood flow to heal wounds on her legs. She did not have another intervention. She is on Plavix with an IVC filter for her previous stents. She had an acute DVT in November I believe and was on Xarelto and Plavix but had a GI bleed of I think esophageal source. She has some swelling in the right thigh marked erythema and swelling in the right mid calf. I wonder whether she is extended the clot burden in her thigh as a cause of the  distal DVT like symptoms. 1/18; the patient's  repeat duplex ultrasound did not show extension of her DVT in fact this had recanalized and was better. At my request she is seeing Dr. Oneida Alar on Thursday to look over the recent angiogram in the ischemic looking areas in her right fourth and today her right second toe. This foot is cold and I wonder whether she either has microvascular disease and/or severe Raynaud's on top of her known macrovascular disease although her recent angiogram did not show anything that needed a particular procedure. The other issue has been copious amounts of drainage coming out of these large necrotic wounds on her right lateral calf. This is almost a jelly green color and consistency. PCR culture I did last week showed high amounts of Pseudomonas which was hardly surprising but also high amounts of Enterococcus faecalis. She is allergic to penicillin, Levaquin and sulfa. There is not a good oral combination of a single drug that would cover this. I am going to ask for infectious disease. About the only positive thing I can say about the right leg as she is not in as much pain as she was several weeks ago. I gave her a prolonged course of cefdinir orally and have increased her compression. The leg is certainly less red and less tender but she is beginning develop ischemic changes in her right forefoot 1/24; the patient went to see Dr. Oneida Alar who said from an arterial/macrovascular and venous standpoint he thought everything was optimized. He referred her to Dr. [name]. Dr. [name] felt he could debride I presume the major wound area areas on the right posterior lateral calf and place a wound matrix presumably ACell. She sees infectious disease tomorrow I think she needs antibiotics perhaps IV antibiotics. This is to address the culturing Pseudomonas and Enterococcus from PCR culture that we got 10 days ago. She also has a topical antibiotic compounded which we can apply to this area topically. The patient has expanding  necrotic wounds on the right posterior lateral calf and multiple small areas anteriorly. This whole area was very angry and erythematous when she came back from her arterial evaluation. At that point I gave her 2 weeks of cefdinir with some improvement in the pain and the erythema but things have never gotten any better. 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 2/21; its been almost a month since I saw this patient. She was admitted the hospital from 02/06/2021 through 02/10/2021. Letter with encephalopathy acute on chronic renal failure, atrial fibrillation. ID was consulted they stopped the antibiotics that Dr. Gale Journey has started as an outpatient. She did receive operative debridement of the wounds on the right posterior lower extremity. Apparently they were grossly necrotic. This was seen by plastic surgery last week. We did not look at this today but apparently there is seeing Dr. [name] this week on Wednesday. She comes in today with her leg wrapped on the right side. She had necrotic areas on the dorsal aspect of her first third and fourth toes as well as the left second. These were all new to me. I suspect this is probably related to digital vasculopathy related to her scleroderma. She also has small punched-out areas on the left lateral foot left lateral malleolus which are not new Objective  Constitutional Sitting or standing Blood Pressure is within target range for patient.. Pulse regular and within target range for patient.Marland Kitchen Respirations regular, non-labored and within target range.. Temperature is normal and within the target range for the patient.. She is not in any distress but looks more frail. Vitals Time Taken: 11:39 AM, Height: 68 in, Weight: 132 lbs, BMI: 20.1, Temperature: 98  F, Pulse: 74 bpm, Respiratory Rate: 17 breaths/min, Blood Pressure: 137/79 mmHg. Respiratory work of breathing is normal. General Notes: Wound exam ooI did not look at her right leg however she has necrotic wounds of the dorsal first third and fourth toes I did not attempt debridement here these are relatively substantial. Also a similar area on the left second toe. ooThe 2 small areas on the left lateral foot and left lateral ankle/lateral malleolus look about the same to have nonviable tissue Integumentary (Hair, Skin) Wound #20 status is Open. Original cause of wound was Gradually Appeared. The date acquired was: 05/02/2020. The wound has been in treatment 42 weeks. The wound is located on the Left,Lateral Malleolus. The wound measures 0.8cm length x 0.9cm width x 0.2cm depth; 0.565cm^2 area and 0.113cm^3 volume. There is Fat Layer (Subcutaneous Tissue) exposed. There is no tunneling or undermining noted. There is a large amount of serosanguineous drainage noted. The wound margin is distinct with the outline attached to the wound base. There is no granulation within the wound bed. There is a large (67-100%) amount of necrotic tissue within the wound bed including Adherent Slough. Wound #25 status is Open. Original cause of wound was Gradually Appeared. The date acquired was: 10/10/2020. The wound has been in treatment 19 weeks. The wound is located on the Left,Lateral Foot. The wound measures 0.7cm length x 0.7cm width x 0.2cm depth; 0.385cm^2 area and 0.077cm^3 volume. There is Fat Layer (Subcutaneous Tissue) exposed. There is no tunneling or undermining noted. There is a large amount of purulent drainage noted. The wound margin is distinct with the outline attached to the wound base. There is no granulation within the wound bed. There is a large (67-100%) amount of necrotic tissue within the wound bed including Eschar and Adherent Slough. Wound #30 status is Open. Original cause of wound was  Gradually Appeared. The date acquired was: 12/26/2020. The wound has been in treatment 8 weeks. The wound is located on the Right T Fourth. The wound measures 3cm length x 2.8cm width x 0.1cm depth; 6.597cm^2 area and 0.66cm^3 volume. There is oe no tunneling or undermining noted. There is a medium amount of serosanguineous drainage noted. The wound margin is distinct with the outline attached to the wound base. There is no granulation within the wound bed. There is a large (67-100%) amount of necrotic tissue within the wound bed including Eschar. Wound #31 status is Open. Original cause of wound was Gradually Appeared. The date acquired was: 01/09/2021. The wound has been in treatment 6 weeks. The wound is located on the Left,Medial Ankle. The wound measures 0.3cm length x 2cm width x 0.2cm depth; 0.471cm^2 area and 0.094cm^3 volume. There is Fat Layer (Subcutaneous Tissue) exposed. There is no tunneling or undermining noted. There is a large amount of serosanguineous drainage noted. The wound margin is distinct with the outline attached to the wound base. There is no granulation within the wound bed. There is a large (67-100%) amount of necrotic tissue within the wound bed including Adherent Slough. Wound #32 status is Open. Original cause of wound was Gradually Appeared. The date acquired was:  01/05/2021. The wound has been in treatment 6 weeks. The wound is located on the Right T Great. The wound measures 4.5cm length x 3cm width x 0.1cm depth; 10.603cm^2 area and 1.06cm^3 volume. There is no oe tunneling or undermining noted. There is a large amount of purulent drainage noted. The wound margin is distinct with the outline attached to the wound base. There is no granulation within the wound bed. There is a large (67-100%) amount of necrotic tissue within the wound bed including Adherent Slough. Wound #33 status is Open. Original cause of wound was Pressure Injury. The date acquired was: 01/17/2021.  The wound has been in treatment 4 weeks. The wound is located on the Right T Second. The wound measures 1.2cm length x 1cm width x 0.1cm depth; 0.942cm^2 area and 0.094cm^3 volume. There is Fat oe Layer (Subcutaneous Tissue) exposed. There is no tunneling or undermining noted. There is a large amount of serosanguineous drainage noted. The wound margin is distinct with the outline attached to the wound base. There is no granulation within the wound bed. There is a large (67-100%) amount of necrotic tissue within the wound bed including Eschar and Adherent Slough. Wound #34 status is Open. Original cause of wound was Gradually Appeared. The date acquired was: 02/10/2021. The wound is located on the Right T Third. oe The wound measures 1.4cm length x 2cm width x 0.1cm depth; 2.199cm^2 area and 0.22cm^3 volume. There is no tunneling or undermining noted. There is a medium amount of purulent drainage noted. The wound margin is distinct with the outline attached to the wound base. There is no granulation within the wound bed. There is a large (67-100%) amount of necrotic tissue within the wound bed including Eschar and Adherent Slough. Wound #35 status is Open. Original cause of wound was Gradually Appeared. The date acquired was: 02/10/2021. The wound is located on the Left T Second. oe The wound measures 1.7cm length x 1cm width x 0.1cm depth; 1.335cm^2 area and 0.134cm^3 volume. There is no tunneling or undermining noted. There is a medium amount of purulent drainage noted. The wound margin is distinct with the outline attached to the wound base. There is no granulation within the wound bed. There is a large (67-100%) amount of necrotic tissue within the wound bed including Adherent Slough. 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 Bathing/ Shower/ Hygiene: May shower and wash wound with soap and water. - on days that dressing is changed Edema Control - Lymphedema / SCD / Other: Elevate legs to the level of the heart or above for 30 minutes daily and/or when sitting, a frequency of: - throughout the day Avoid standing for long periods of time. Exercise regularly Home Health: New wound care orders this week;  continue Home Health for wound care. May utilize formulary equivalent dressing for wound treatment orders unless otherwise specified. - Silver alginate to all toe wounds, change primary dressing on left leg and left foot to Iodosorb gel (or Iodoflex). Continue to follow orders by Dr. [name] for right lower leg wounds Other Home Health Orders/Instructions: Alvis Lemmings WOUND #20: - Malleolus Wound Laterality: Left, Lateral Cleanser: Soap and Water Every Other Day/7 Days Discharge Instructions: May shower and wash wound with dial antibacterial soap and water prior to dressing change. Peri-Wound Care: Sween Lotion (Moisturizing lotion) Every Other Day/7 Days Discharge Instructions: Apply moisturizing lotion as directed Prim Dressing: Iodosorb Gel 10 (gm) Tube Every Other Day/7 Days ary Discharge Instructions: Apply to wound bed as instructed Secondary  Dressing: Woven Gauze Sponge, Non-Sterile 4x4 in (Generic) Every Other Day/7 Days Discharge Instructions: Apply over primary dressing as directed. Secondary Dressing: ABD Pad, 8x10 Every Other Day/7 Days Discharge Instructions: Apply over primary dressing as directed. Com pression Wrap: Kerlix Roll 4.5x3.1 (in/yd) (Generic) Every Other Day/7 Days Discharge Instructions: Apply Kerlix and Coban compression as directed. Com pression Wrap: Coban Self-Adherent Wrap 4x5 (in/yd) (Generic) Every Other Day/7 Days Discharge Instructions: Apply over Kerlix as directed. WOUND #25: - Foot Wound Laterality: Left, Lateral Cleanser: Soap and Water Every Other Day/7 Days Discharge Instructions: May shower and wash wound with dial antibacterial soap and water prior to dressing change. Peri-Wound Care: Sween Lotion (Moisturizing lotion) Every Other Day/7 Days Discharge Instructions: Apply moisturizing lotion as directed Prim Dressing: Iodosorb Gel 10 (gm) Tube Every Other Day/7 Days ary Discharge Instructions: Apply to wound bed as instructed Secondary Dressing: Woven Gauze Sponge, Non-Sterile 4x4 in (Generic) Every Other Day/7 Days Discharge Instructions: Apply over primary dressing as directed. Secondary Dressing: ABD Pad, 8x10 Every Other Day/7 Days Discharge Instructions: Apply over primary dressing as directed. Com pression Wrap: Kerlix Roll 4.5x3.1 (in/yd) (Generic) Every Other Day/7 Days Discharge Instructions: Apply Kerlix and Coban compression as directed. Com pression Wrap: Coban Self-Adherent Wrap 4x5 (in/yd) (Generic) Every Other Day/7 Days Discharge Instructions: Apply over Kerlix as directed. WOUND #30: - T Fourth Wound Laterality: Right oe Cleanser: Soap and Water Every Other Day/30 Days Discharge Instructions: May shower and wash wound with dial antibacterial soap and water prior to dressing change. Peri-Wound Care: Sween Lotion (Moisturizing lotion) Every Other Day/30 Days Discharge  Instructions: Apply moisturizing lotion as directed Prim Dressing: KerraCel Ag Gelling Fiber Dressing, 4x5 in (silver alginate) (Generic) Every Other Day/30 Days ary Discharge Instructions: Apply silver alginate to wound bed as instructed Secondary Dressing: Woven Gauze Sponge, Non-Sterile 4x4 in (Generic) Every Other Day/30 Days Discharge Instructions: Apply over primary dressing as directed. Secured With: Child psychotherapist, Sterile 2x75 (in/in) Every Other Day/30 Days Discharge Instructions: Secure with stretch gauze as directed. WOUND #31: - Ankle Wound Laterality: Left, Medial Cleanser: Soap and Water Every Other Day/7 Days Discharge Instructions: May shower and wash wound with dial antibacterial soap and water prior to dressing change. Peri-Wound Care: Sween Lotion (Moisturizing lotion) Every Other Day/7 Days Discharge Instructions: Apply moisturizing lotion as directed Prim Dressing: Iodosorb Gel 10 (gm) Tube Every Other Day/7 Days ary Discharge Instructions: Apply to wound bed as instructed Secondary Dressing: Woven Gauze Sponge, Non-Sterile 4x4 in (Generic) Every Other Day/7 Days Discharge Instructions: Apply over primary dressing as directed. Secondary Dressing: ABD Pad, 8x10 Every Other Day/7 Days Discharge Instructions: Apply over primary dressing as directed. Com pression Wrap: Kerlix Roll 4.5x3.1 (in/yd) (Generic) Every Other Day/7 Days Discharge Instructions: Apply  Kerlix and Coban compression as directed. Com pression Wrap: Coban Self-Adherent Wrap 4x5 (in/yd) (Generic) Every Other Day/7 Days Discharge Instructions: Apply over Kerlix as directed. WOUND #32: - T Great Wound Laterality: Right oe Cleanser: Soap and Water Every Other Day/30 Days Discharge Instructions: May shower and wash wound with dial antibacterial soap and water prior to dressing change. Peri-Wound Care: Sween Lotion (Moisturizing lotion) Every Other Day/30 Days Discharge Instructions: Apply  moisturizing lotion as directed Prim Dressing: KerraCel Ag Gelling Fiber Dressing, 4x5 in (silver alginate) (Generic) Every Other Day/30 Days ary Discharge Instructions: Apply silver alginate to wound bed as instructed Secondary Dressing: Woven Gauze Sponge, Non-Sterile 4x4 in (Generic) Every Other Day/30 Days Discharge Instructions: Apply over primary dressing as directed. Secured With: Child psychotherapist, Sterile 2x75 (in/in) Every Other Day/30 Days Discharge Instructions: Secure with stretch gauze as directed. WOUND #33: - T Second Wound Laterality: Right oe Cleanser: Soap and Water Every Other Day/30 Days Discharge Instructions: May shower and wash wound with dial antibacterial soap and water prior to dressing change. Peri-Wound Care: Sween Lotion (Moisturizing lotion) Every Other Day/30 Days Discharge Instructions: Apply moisturizing lotion as directed Prim Dressing: KerraCel Ag Gelling Fiber Dressing, 4x5 in (silver alginate) (Generic) Every Other Day/30 Days ary Discharge Instructions: Apply silver alginate to wound bed as instructed Secondary Dressing: Woven Gauze Sponge, Non-Sterile 4x4 in (Generic) Every Other Day/30 Days Discharge Instructions: Apply over primary dressing as directed. Secured With: Child psychotherapist, Sterile 2x75 (in/in) Every Other Day/30 Days Discharge Instructions: Secure with stretch gauze as directed. WOUND #34: - T Third Wound Laterality: Right oe Cleanser: Soap and Water Every Other Day/30 Days Discharge Instructions: May shower and wash wound with dial antibacterial soap and water prior to dressing change. Peri-Wound Care: Sween Lotion (Moisturizing lotion) Every Other Day/30 Days Discharge Instructions: Apply moisturizing lotion as directed Prim Dressing: KerraCel Ag Gelling Fiber Dressing, 4x5 in (silver alginate) (Generic) Every Other Day/30 Days ary Discharge Instructions: Apply silver alginate to wound bed as  instructed Secondary Dressing: Woven Gauze Sponge, Non-Sterile 4x4 in (Generic) Every Other Day/30 Days Discharge Instructions: Apply over primary dressing as directed. Secured With: Child psychotherapist, Sterile 2x75 (in/in) Every Other Day/30 Days Discharge Instructions: Secure with stretch gauze as directed. WOUND #35: - T Second Wound Laterality: Left oe Cleanser: Soap and Water Every Other Day/30 Days Discharge Instructions: May shower and wash wound with dial antibacterial soap and water prior to dressing change. Peri-Wound Care: Sween Lotion (Moisturizing lotion) Every Other Day/30 Days Discharge Instructions: Apply moisturizing lotion as directed Prim Dressing: KerraCel Ag Gelling Fiber Dressing, 4x5 in (silver alginate) (Generic) Every Other Day/30 Days ary Discharge Instructions: Apply silver alginate to wound bed as instructed Secondary Dressing: Woven Gauze Sponge, Non-Sterile 4x4 in (Generic) Every Other Day/30 Days Discharge Instructions: Apply over primary dressing as directed. Secured With: Child psychotherapist, Sterile 2x75 (in/in) Every Other Day/30 Days Discharge Instructions: Secure with stretch gauze as directed. 1. I am going to continue using Iodoflex to the areas on the left ankle and left foot silver alginate to all the new toe wounds 2. The toe wounds are completely necrotic. 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 I suppose she could have embolized at some point. 3. I did not look at the right leg but I gather the wounds did not look good there. This is a complex situation she has arterial disease all and has been revascularized although I suspect she also has severe chronic venous hypertension probably  central in etiology. 4. Infectious disease does not feel that there is a systemic infection or local infection we are treating with antibiotics and they were not at all confident about giving her antibiotics before she went in the hospital.  Nevertheless apparently the wounds on her right lateral leg are completely necrotic with green drainage. She sees Dr. [name] for this on Wednesday 5. The patient does not look systemically nearly as well as she used to. I am not sure of the exact etiology here. Electronic Signature(s) Signed: 02/20/2021 5:55:48 PM By: Linton Ham MD Entered By: Linton Ham on 02/20/2021 17:47:59 -------------------------------------------------------------------------------- SuperBill Details Patient Name: Date of Service: BLA [name] 02/20/2021 Medical Record Number: [id number] Patient Account Number: [id number] Date of Birth/Sex: Treating RN: 08/07/1951 (70 y.o. [name] Primary Care Provider: [name] Other Clinician: Referring Provider: Treating Provider/Extender: [name] in Treatment: 465 Diagnosis Coding ICD-10 Codes Code Description [phone] Non-pressure chronic ulcer of right calf with necrosis of muscle L97.811 Non-pressure chronic ulcer of other part of right lower leg limited to breakdown of skin I83.222 Varicose veins of left lower extremity with both ulcer of calf and inflammation I87.331 Chronic venous hypertension (idiopathic) with ulcer and inflammation of right lower extremity L89.616 Pressure-induced deep tissue damage of right heel L97.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 L97.528 Non-pressure chronic ulcer of other part of left foot with other specified severity I70.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 I70.248 Atherosclerosis of native arteries of left leg with ulceration of other part of lower leg L97.518 Non-pressure chronic ulcer of other part of right foot with other specified severity Facility Procedures Physician Procedures : CPT4 Code Description Modifier [id number] - WC PHYS LEVEL 3 - EST PT ICD-10 Diagnosis Description L97.328  Non-pressure chronic ulcer of left ankle with other specified severity L97.518 Non-pressure chronic ulcer of other part of right foot with  other specified severity L97.528 Non-pressure chronic ulcer of other part of left foot with other specified severity Quantity: 1 Electronic Signature(s) Signed: 02/20/2021 5:55:48 PM By: Linton Ham MD Previous Signature: 02/20/2021 5:41:15 PM Version By: Levan Hurst RN, BSN Entered By: Linton Ham on 02/20/2021 17:48:57

## 2021-02-20 NOTE — Patient Instructions (Signed)
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 There is no active cellulitis at this time. I would advise against systemic or topical antibiotics at this time  We have an option of medical maggot therapy on the table for discussion. If this is undertaken, the wound center would be someone who carries this out. From my standpoint, there is no absolute contraindication. It could promote healing as well.    Will check a set of baseline blood test today. Call my clinic if there is sign of infection. Otherwise follow up with wound care and plastic surgery

## 2021-02-21 ENCOUNTER — Encounter: Payer: Self-pay | Admitting: Internal Medicine

## 2021-02-21 ENCOUNTER — Ambulatory Visit (INDEPENDENT_AMBULATORY_CARE_PROVIDER_SITE_OTHER): Payer: Medicare Other | Admitting: Internal Medicine

## 2021-02-21 VITALS — BP 120/70 | HR 75 | Ht 65.0 in | Wt 119.0 lb

## 2021-02-21 DIAGNOSIS — D508 Other iron deficiency anemias: Secondary | ICD-10-CM | POA: Diagnosis not present

## 2021-02-21 DIAGNOSIS — M349 Systemic sclerosis, unspecified: Secondary | ICD-10-CM | POA: Diagnosis not present

## 2021-02-21 DIAGNOSIS — M810 Age-related osteoporosis without current pathological fracture: Secondary | ICD-10-CM

## 2021-02-21 DIAGNOSIS — E782 Mixed hyperlipidemia: Secondary | ICD-10-CM

## 2021-02-21 DIAGNOSIS — I2729 Other secondary pulmonary hypertension: Secondary | ICD-10-CM

## 2021-02-21 DIAGNOSIS — N1831 Chronic kidney disease, stage 3a: Secondary | ICD-10-CM

## 2021-02-21 DIAGNOSIS — Z8659 Personal history of other mental and behavioral disorders: Secondary | ICD-10-CM

## 2021-02-21 DIAGNOSIS — I10 Essential (primary) hypertension: Secondary | ICD-10-CM | POA: Diagnosis not present

## 2021-02-21 DIAGNOSIS — L97921 Non-pressure chronic ulcer of unspecified part of left lower leg limited to breakdown of skin: Secondary | ICD-10-CM

## 2021-02-21 LAB — COMPREHENSIVE METABOLIC PANEL
AG Ratio: 1 (calc) (ref 1.0–2.5)
ALT: 12 U/L (ref 6–29)
AST: 13 U/L (ref 10–35)
Albumin: 3.2 g/dL — ABNORMAL LOW (ref 3.6–5.1)
Alkaline phosphatase (APISO): 102 U/L (ref 37–153)
BUN/Creatinine Ratio: 26 (calc) — ABNORMAL HIGH (ref 6–22)
BUN: 31 mg/dL — ABNORMAL HIGH (ref 7–25)
CO2: 31 mmol/L (ref 20–32)
Calcium: 8.6 mg/dL (ref 8.6–10.4)
Chloride: 90 mmol/L — ABNORMAL LOW (ref 98–110)
Creat: 1.19 mg/dL — ABNORMAL HIGH (ref 0.60–0.93)
Globulin: 3.1 g/dL (calc) (ref 1.9–3.7)
Glucose, Bld: 110 mg/dL — ABNORMAL HIGH (ref 65–99)
Potassium: 4.8 mmol/L (ref 3.5–5.3)
Sodium: 133 mmol/L — ABNORMAL LOW (ref 135–146)
Total Bilirubin: 0.3 mg/dL (ref 0.2–1.2)
Total Protein: 6.3 g/dL (ref 6.1–8.1)

## 2021-02-21 LAB — CBC WITH DIFFERENTIAL/PLATELET
Absolute Monocytes: 613 cells/uL (ref 200–950)
Basophils Absolute: 25 cells/uL (ref 0–200)
Basophils Relative: 0.2 %
Eosinophils Absolute: 13 cells/uL — ABNORMAL LOW (ref 15–500)
Eosinophils Relative: 0.1 %
HCT: 30.4 % — ABNORMAL LOW (ref 35.0–45.0)
Hemoglobin: 9.9 g/dL — ABNORMAL LOW (ref 11.7–15.5)
Lymphs Abs: 375 cells/uL — ABNORMAL LOW (ref 850–3900)
MCH: 30.1 pg (ref 27.0–33.0)
MCHC: 32.6 g/dL (ref 32.0–36.0)
MCV: 92.4 fL (ref 80.0–100.0)
MPV: 9.3 fL (ref 7.5–12.5)
Monocytes Relative: 4.9 %
Neutro Abs: 11475 cells/uL — ABNORMAL HIGH (ref 1500–7800)
Neutrophils Relative %: 91.8 %
Platelets: 402 10*3/uL — ABNORMAL HIGH (ref 140–400)
RBC: 3.29 10*6/uL — ABNORMAL LOW (ref 3.80–5.10)
RDW: 15.2 % — ABNORMAL HIGH (ref 11.0–15.0)
Total Lymphocyte: 3 %
WBC: 12.5 10*3/uL — ABNORMAL HIGH (ref 3.8–10.8)

## 2021-02-21 LAB — C-REACTIVE PROTEIN: CRP: 60.3 mg/L — ABNORMAL HIGH (ref <8.0)

## 2021-02-21 MED ORDER — ONDANSETRON HCL 4 MG PO TABS: 4.0000 mg | ORAL_TABLET | Freq: Three times a day (TID) | ORAL | 0 refills | Status: DC | PRN

## 2021-02-21 NOTE — Progress Notes (Signed)
[name] [name], [name] [name] ([id number]) Visit Report for 02/20/2021 Arrival Information Details Patient Name: Date of Service: [name] [name] 02/20/2021 11:00 A M Medical Record Number: [id number] Patient Account Number: [id number] Date of Birth/Sex: Treating RN: [birth year] (70 y.o. [name], [name] Primary Care Provider: [name] Other Clinician: Referring Provider: Treating Provider/Extender: [name] in Treatment: 74 Visit Information History Since Last Visit Added or deleted any medications: No Patient Arrived: Wheel Chair Any new allergies or adverse reactions: No Arrival Time: 11:39 Had a fall or experienced change in No Accompanied By: husband activities of daily living that may affect Transfer Assistance: None risk of falls: Patient Identification Verified: Yes Signs or symptoms of abuse/neglect since last visito No Secondary Verification Process Completed: Yes Hospitalized since last visit: No Patient Requires Transmission-Based Precautions: No Implantable device outside of the clinic excluding No Patient Has Alerts: Yes cellular tissue based products placed in the center Patient Alerts: R ABI: [place] TBI: 0.41 since last visit: L ABI: [place] (07/2020) Has Dressing in Place as Prescribed: Yes Pain Present Now: Yes Electronic Signature(s) Signed: 02/21/2021 5:43:17 PM By: Rhae Hammock RN Entered By: Rhae Hammock on 02/20/2021 11:40:25 -------------------------------------------------------------------------------- Clinic Level of Care Assessment Details Patient Name: Date of Service: [name] [name], [name] Manges. 02/20/2021 11:00 A M Medical Record Number: [id number] Patient Account Number: [id number] Date of Birth/Sex: Treating RN: 12/16/1951 (70 y.o. [name] Primary Care Provider: [name] Other Clinician: Referring Provider: Treating Provider/Extender: [name] in Treatment: [facility] Level of Care  Assessment Items TOOL 4 Quantity Score X- 1 0 Use when only an EandM is performed on FOLLOW-UP visit ASSESSMENTS - Nursing Assessment / Reassessment X- 1 10 Reassessment of Co-morbidities (includes updates in patient status) X- 1 5 Reassessment of Adherence to Treatment Plan ASSESSMENTS - Wound and Skin A ssessment / Reassessment _0  - 0 Simple Wound Assessment / Reassessment - one wound X- 8 5 Complex Wound Assessment / Reassessment - multiple wounds _1  - 0 Dermatologic / Skin Assessment (not related to wound area) ASSESSMENTS - Focused Assessment _2  - 0 Circumferential Edema Measurements - multi extremities _3  - 0 Nutritional Assessment / Counseling / Intervention X- 1 5 Lower Extremity Assessment (monofilament, tuning fork, pulses) _4  - 0 Peripheral Arterial Disease Assessment (using hand held doppler) ASSESSMENTS - Ostomy and/or Continence Assessment and Care _5  - 0 Incontinence Assessment and Management _6  - 0 Ostomy Care Assessment and Management (repouching, etc.) PROCESS - Coordination of Care X - Simple Patient / Family Education for ongoing care 1 15 _7  - 0 Complex (extensive) Patient / Family Education for ongoing care X- 1 10 Staff obtains Programmer, systems, Records, T Results / Process Orders est X- 1 10 Staff telephones HHA, Nursing Homes / Clarify orders / etc _8  - 0 Routine Transfer to another Facility (non-emergent condition) _9  - 0 Routine Hospital Admission (non-emergent condition) _10  - 0 New Admissions / Biomedical engineer / Ordering NPWT Apligraf, etc. , _11  - 0 Emergency Hospital Admission (emergent condition) X- 1 10 Simple Discharge Coordination _12  - 0 Complex (extensive) Discharge Coordination PROCESS - Special Needs _13  - 0 Pediatric / Minor Patient Management _14  - 0 Isolation Patient Management _15  - 0 Hearing / Language / Visual special needs _16  - 0 Assessment of Community assistance (transportation, D/C planning, etc.) _17  -  0 Additional assistance / Altered mentation _18  - 0 Support Surface(s) Assessment (bed, cushion, seat, etc.) INTERVENTIONS - Wound Cleansing / Measurement _19  -  0 Simple Wound Cleansing - one wound X- 8 5 Complex Wound Cleansing - multiple wounds X- 1 5 Wound Imaging (photographs - any number of wounds) _0  - 0 Wound Tracing (instead of photographs) _1  - 0 Simple Wound Measurement - one wound X- 8 5 Complex Wound Measurement - multiple wounds INTERVENTIONS - Wound Dressings _2  - 0 Small Wound Dressing one or multiple wounds _3  - 0 Medium Wound Dressing one or multiple wounds X- 1 20 Large Wound Dressing one or multiple wounds X- 1 5 Application of Medications - topical <[id number]>_5<\/[id number]>_7  - 0 Application of Medications - injection INTERVENTIONS - Miscellaneous _5  - 0 External ear exam _6  - 0 Specimen Collection (cultures, biopsies, blood, body fluids, etc.) _7  - 0 Specimen(s) / Culture(s) sent or taken to Lab for analysis _8  - 0 Patient Transfer (multiple staff / Civil Service fast streamer / Similar devices) _9  - 0 Simple Staple / Suture removal (25 or less) _10  - 0 Complex Staple / Suture removal (26 or more) _11  - 0 Hypo / Hyperglycemic Management (close monitor of Blood Glucose) _12  - 0 Ankle / Brachial Index (ABI) - do not check if billed separately X- 1 5 Vital Signs Has the patient been seen at the hospital within the last three years: Yes Total Score: 220 Level Of Care: New/Established - Level 5 Electronic Signature(s) Signed: 02/20/2021 5:41:15 PM By: Levan Hurst RN, BSN Entered By: Levan Hurst on 02/20/2021 13:30:15 -------------------------------------------------------------------------------- Encounter Discharge Information Details Patient Name: Date of Service: [name] [name], [name] 02/20/2021 11:00 A M Medical Record Number: [id number] Patient Account Number: [id number] Date of Birth/Sex: Treating RN: Sep 24, 1951 (70 y.o. [name] Primary Care Provider: [name] Other  Clinician: Referring Provider: Treating Provider/Extender: [name] in Treatment: [phone] Encounter Discharge Information Items Discharge Condition: Stable Ambulatory Status: Wheelchair Discharge Destination: Home Transportation: Private Auto Accompanied By: husband Schedule Follow-up Appointment: Yes Clinical Summary of Care: Electronic Signature(s) Signed: 02/20/2021 6:14:41 PM By: Deon Pilling Entered By: Deon Pilling on 02/20/2021 17:41:10 -------------------------------------------------------------------------------- Lower Extremity Assessment Details Patient Name: Date of Service: [name] [name] 02/20/2021 11:00 A M Medical Record Number: [id number] Patient Account Number: [id number] Date of Birth/Sex: Treating RN: 1951/06/04 (70 y.o. [name], [name] Primary Care Provider: [name] Other Clinician: Referring Provider: Treating Provider/Extender: [name] in Treatment: 465 Edema Assessment Assessed: [name]: Yes] [name]: No] Edema: [Left: Yes] [Right: Yes] Calf Left: Right: Point of Measurement: 36 cm From Medial Instep 34 cm Ankle Left: Right: Point of Measurement: 10 cm From Medial Instep 22.5 cm Vascular Assessment Pulses: Dorsalis Pedis Palpable: [Left:Yes] Posterior Tibial Palpable: [Left:Yes] Notes pt. did not want right leg wrap taken off today Electronic Signature(s) Signed: 02/21/2021 5:43:17 PM By: Rhae Hammock RN Entered By: Rhae Hammock on 02/20/2021 11:39:31 -------------------------------------------------------------------------------- Multi Wound Chart Details Patient Name: Date of Service: [name] [name] 02/20/2021 11:00 A M Medical Record Number: [id number] Patient Account Number: [id number] Date of Birth/Sex: Treating RN: April 12, 1951 (70 y.o. 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]  Primary Etiology: [20:Anemia, Hypertension, Peripheral] [25:Anemia, Hypertension, Peripheral] [30:Anemia, Hypertension, Peripheral] Comorbid History: [20:Arterial Disease, Peripheral Venous Arterial Disease, Peripheral Venous Arterial Disease, Peripheral Venous Disease, Raynauds, Scleroderma, Disease, Raynauds, Scleroderma, Disease, Raynauds, Scleroderma, Rheumatoid Arthritis,  Osteoarthritis Rheumatoid Arthritis, Osteoarthritis Rheumatoid Arthritis, Osteoarthritis 05/02/2020] [25:10/10/2020] [30:12/26/2020] Date Acquired: [20:42] [25:19] [30:8] Weeks of Treatment: [20:Open] [25:Open] [30:Open] Wound Status: [20:0.8x0.9x0.2] [25:0.7x0.7x0.2] [30:3x2.8x0.1] Measurements L x W x D (cm) [20:0.565] [25:0.385] [30:6.597] A (cm) : rea [20:0.113] [25:0.077] [30:0.66] Volume (cm) : [20:-28.40%] [25:-2.10%] [30:-50.00%] % Reduction in Area: [20:-28.40%] [25:-102.60%] [30:-50.00%] % Reduction in Volume: [20:Full Thickness Without Exposed] [25:Full Thickness Without Exposed] [30:Full Thickness Without Exposed] Classification: [20:Support Structures Large] [25:Support Structures Large] [30:Support Structures Medium] Exudate A mount: [20:Serosanguineous] [25:Purulent] [30:Serosanguineous] Exudate Type: [20:red, brown] [25:yellow, brown, green] [30:red, brown] Exudate Color: [20:Distinct, outline attached] [25:Distinct, outline attached] [30:Distinct, outline attached] Wound Margin:  [20:None Present (0%)] [25:None Present (0%)] [30:None Present (0%)] Granulation Amount: [20:Large (67-100%)] [25:Large (67-100%)] [30:Large (67-100%)] Necrotic Amount: [20:Adherent Slough] [25:Eschar, Adherent Slough] [30:Eschar] Necrotic Tissue: [20:Fat Layer (Subcutaneous Tissue): Yes Fat Layer (Subcutaneous Tissue): Yes Fascia: No] Exposed Structures: [20:Fascia: No Tendon: No Muscle: No Joint: No Bone: No None] [25:Fascia: No Tendon: No Muscle: No Joint: No Bone: No None] [30:Fat Layer (Subcutaneous Tissue): No Tendon: No Muscle: No Joint: No Bone: No None] Wound Number: [id number] Photos: No Photos No Photos No Photos Left, Medial Ankle Right T Great oe Right T Second oe Wound Location: Gradually Appeared Gradually Appeared Pressure Injury 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 Primary Etiology: Anemia, Hypertension, Peripheral Anemia, Hypertension, Peripheral Anemia, Hypertension, Peripheral Comorbid History: Arterial Disease, Peripheral Venous Arterial Disease, Peripheral Venous Arterial Disease, Peripheral Venous Disease, Raynauds, Scleroderma, Disease, Raynauds, Scleroderma, Disease, Raynauds, Scleroderma, Rheumatoid Arthritis, Osteoarthritis Rheumatoid Arthritis, Osteoarthritis Rheumatoid Arthritis, Osteoarthritis 01/09/2021 01/05/2021 01/17/2021 Date Acquired: _0 Weeks of Treatment: Open Open Open Wound Status: 0.3x2x0.2 4.5x3x0.1 1.2x1x0.1 Measurements L x W x D (cm) 0.471 10.603 0.942 A (cm) : rea 0.094 1.06 0.094 Volume (cm) : 37.50% -1262.90% -647.60% % Reduction in Area: -25.30% -1259.00% -623.10% % Reduction in Volume: Full Thickness Without Exposed Full Thickness Without Exposed Category/Stage III Classification: Support Structures Support Structures Large Large Large Exudate A mount: Serosanguineous Purulent Serosanguineous Exudate Type: red, brown yellow, brown, green red, brown Exudate Color: Distinct, outline attached  Distinct, outline attached Distinct, outline attached Wound Margin: None Present (0%) None Present (0%) None Present (0%) Granulation Amount: Large (67-100%) Large (67-100%) Large (67-100%) Necrotic Amount: Adherent Slough Adherent Slough Eschar, Adherent Slough Necrotic Tissue: Fat Layer (Subcutaneous Tissue): Yes Fascia: No Fat Layer (Subcutaneous Tissue): Yes Exposed Structures: Fascia: No Fat Layer (Subcutaneous Tissue): No Fascia: No Tendon: No Tendon: No Tendon: No Muscle: No Muscle: No Muscle: No Joint: No Joint: No Joint: No Bone: No Bone: No Bone: No None None None Epithelialization: Wound Number: [id number] N/A Photos: No Photos No Photos N/A Right T Third oe Left T Second oe N/A Wound Location: Gradually Appeared Gradually Appeared N/A 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 N/A Primary Etiology: Anemia, Hypertension, Peripheral Anemia, Hypertension, Peripheral N/A Comorbid History: Arterial Disease, Peripheral Venous Arterial Disease, Peripheral Venous Disease, Raynauds, Scleroderma, Disease, Raynauds, Scleroderma, Rheumatoid Arthritis, Osteoarthritis Rheumatoid Arthritis, Osteoarthritis 02/10/2021 02/10/2021 N/A Date Acquired: 0 0 N/A Weeks of Treatment: Open Open N/A Wound Status: 1.4x2x0.1 1.7x1x0.1 N/A Measurements L x W x D (cm) 2.199 1.335 N/A A (cm) : rea 0.22 0.134 N/A Volume (cm) : N/A N/A N/A % Reduction in Area: N/A  N/A N/A % Reduction in Volume: Full Thickness Without Exposed Full Thickness Without Exposed N/A Classification: Support Structures Support Structures Medium Medium N/A Exudate A mount: Purulent Purulent N/A Exudate Type: yellow, brown, green yellow, brown, green N/A Exudate Color: Distinct, outline attached Distinct, outline attached N/A Wound Margin: None Present (0%) None Present (0%) N/A Granulation Amount: Large (67-100%) Large (67-100%) N/A Necrotic Amount: Eschar, Adherent  Slough Adherent Slough N/A Necrotic Tissue: Fascia: No Fascia: No N/A Exposed Structures: Fat Layer (Subcutaneous Tissue): No Fat Layer (Subcutaneous Tissue): No Tendon: No Tendon: No Muscle: No Muscle: No Joint: No Joint: No Bone: No Bone: No None None N/A Epithelialization: Treatment Notes Wound #16 (Calcaneus) Wound Laterality: Right Cleanser Peri-Wound Care Topical Primary Dressing Secondary Dressing Secured With Compression Wrap Compression Stockings Add-Ons Wound #19 (Malleolus) Wound Laterality: Right, Lateral Cleanser Peri-Wound Care Topical Primary Dressing Secondary Dressing Secured With Compression Wrap Compression Stockings Add-Ons Wound #20 (Malleolus) Wound Laterality: Left, Lateral Cleanser Soap and Water Discharge Instruction: May shower and wash wound with dial antibacterial soap and water prior to dressing change. Peri-Wound Care Sween Lotion (Moisturizing lotion) Discharge Instruction: Apply moisturizing lotion as directed Topical Primary Dressing Iodosorb Gel 10 (gm) Tube Discharge Instruction: Apply to wound bed as instructed Secondary Dressing Woven Gauze Sponge, Non-Sterile 4x4 in Discharge Instruction: Apply over primary dressing as directed. ABD Pad, 8x10 Discharge Instruction: Apply over primary dressing as directed. Secured With Compression Wrap Kerlix Roll 4.5x3.1 (in/yd) Discharge Instruction: Apply Kerlix and Coban compression as directed. Coban Self-Adherent Wrap 4x5 (in/yd) Discharge Instruction: Apply over Kerlix as directed. Compression Stockings Add-Ons Wound #24 (Lower Leg) Wound Laterality: Right, Anterior Cleanser Peri-Wound Care Topical Primary Dressing Secondary Dressing Secured With Compression Wrap Compression Stockings Add-Ons Wound #25 (Foot) Wound Laterality: Left, Lateral Cleanser Soap and Water Discharge Instruction: May shower and wash wound with dial antibacterial soap and water prior to  dressing change. Peri-Wound Care Sween Lotion (Moisturizing lotion) Discharge Instruction: Apply moisturizing lotion as directed Topical Primary Dressing Iodosorb Gel 10 (gm) Tube Discharge Instruction: Apply to wound bed as instructed Secondary Dressing Woven Gauze Sponge, Non-Sterile 4x4 in Discharge Instruction: Apply over primary dressing as directed. ABD Pad, 8x10 Discharge Instruction: Apply over primary dressing as directed. Secured With Compression Wrap Kerlix Roll 4.5x3.1 (in/yd) Discharge Instruction: Apply Kerlix and Coban compression as directed. Coban Self-Adherent Wrap 4x5 (in/yd) Discharge Instruction: Apply over Kerlix as directed. Compression Stockings Add-Ons Wound #26 (Lower Leg) Wound Laterality: Right, Lateral Cleanser Peri-Wound Care Topical Primary Dressing Secondary Dressing Secured With Compression Wrap Compression Stockings Add-Ons Wound #27 (Lower Leg) Wound Laterality: Right, Anterior, Distal Cleanser Peri-Wound Care Topical Primary Dressing Secondary Dressing Secured With Compression Wrap Compression Stockings Add-Ons Wound #29 (Lower Leg) Wound Laterality: Right, Lateral, Proximal Cleanser Peri-Wound Care Topical Primary Dressing Secondary Dressing Secured With Compression Wrap Compression Stockings Add-Ons Wound #30 (Toe Fourth) Wound Laterality: Right Cleanser Soap and Water Discharge Instruction: May shower and wash wound with dial antibacterial soap and water prior to dressing change. Peri-Wound Care Sween Lotion (Moisturizing lotion) Discharge Instruction: Apply moisturizing lotion as directed Topical Primary Dressing KerraCel Ag Gelling Fiber Dressing, 4x5 in (silver alginate) Discharge Instruction: Apply silver alginate to wound bed as instructed Secondary Dressing Woven Gauze Sponge, Non-Sterile 4x4 in Discharge Instruction: Apply over primary dressing as directed. Secured With Conforming Stretch Gauze Bandage,  Sterile 2x75 (in/in) Discharge Instruction: Secure with stretch gauze as directed. Compression Wrap Compression Stockings Add-Ons Wound #31 (Ankle) Wound Laterality: Left, Medial Cleanser Soap and Water Discharge Instruction: May shower  and wash wound with dial antibacterial soap and water prior to dressing change. Peri-Wound Care Sween Lotion (Moisturizing lotion) Discharge Instruction: Apply moisturizing lotion as directed Topical Primary Dressing Iodosorb Gel 10 (gm) Tube Discharge Instruction: Apply to wound bed as instructed Secondary Dressing Woven Gauze Sponge, Non-Sterile 4x4 in Discharge Instruction: Apply over primary dressing as directed. ABD Pad, 8x10 Discharge Instruction: Apply over primary dressing as directed. Secured With Compression Wrap Kerlix Roll 4.5x3.1 (in/yd) Discharge Instruction: Apply Kerlix and Coban compression as directed. Coban Self-Adherent Wrap 4x5 (in/yd) Discharge Instruction: Apply over Kerlix as directed. Compression Stockings Add-Ons Wound #32 (Toe Great) Wound Laterality: Right Cleanser Soap and Water Discharge Instruction: May shower and wash wound with dial antibacterial soap and water prior to dressing change. Peri-Wound Care Sween Lotion (Moisturizing lotion) Discharge Instruction: Apply moisturizing lotion as directed Topical Primary Dressing KerraCel Ag Gelling Fiber Dressing, 4x5 in (silver alginate) Discharge Instruction: Apply silver alginate to wound bed as instructed Secondary Dressing Woven Gauze Sponge, Non-Sterile 4x4 in Discharge Instruction: Apply over primary dressing as directed. Secured With Conforming Stretch Gauze Bandage, Sterile 2x75 (in/in) Discharge Instruction: Secure with stretch gauze as directed. Compression Wrap Compression Stockings Add-Ons Wound #33 (Toe Second) Wound Laterality: Right Cleanser Soap and Water Discharge Instruction: May shower and wash wound with dial antibacterial soap and water  prior to dressing change. Peri-Wound Care Sween Lotion (Moisturizing lotion) Discharge Instruction: Apply moisturizing lotion as directed Topical Primary Dressing KerraCel Ag Gelling Fiber Dressing, 4x5 in (silver alginate) Discharge Instruction: Apply silver alginate to wound bed as instructed Secondary Dressing Woven Gauze Sponge, Non-Sterile 4x4 in Discharge Instruction: Apply over primary dressing as directed. Secured With Conforming Stretch Gauze Bandage, Sterile 2x75 (in/in) Discharge Instruction: Secure with stretch gauze as directed. Compression Wrap Compression Stockings Add-Ons Wound #34 (Toe Third) Wound Laterality: Right Cleanser Soap and Water Discharge Instruction: May shower and wash wound with dial antibacterial soap and water prior to dressing change. Peri-Wound Care Sween Lotion (Moisturizing lotion) Discharge Instruction: Apply moisturizing lotion as directed Topical Primary Dressing KerraCel Ag Gelling Fiber Dressing, 4x5 in (silver alginate) Discharge Instruction: Apply silver alginate to wound bed as instructed Secondary Dressing Woven Gauze Sponge, Non-Sterile 4x4 in Discharge Instruction: Apply over primary dressing as directed. Secured With Conforming Stretch Gauze Bandage, Sterile 2x75 (in/in) Discharge Instruction: Secure with stretch gauze as directed. Compression Wrap Compression Stockings Add-Ons Wound #35 (Toe Second) Wound Laterality: Left Cleanser Soap and Water Discharge Instruction: May shower and wash wound with dial antibacterial soap and water prior to dressing change. Peri-Wound Care Sween Lotion (Moisturizing lotion) Discharge Instruction: Apply moisturizing lotion as directed Topical Primary Dressing KerraCel Ag Gelling Fiber Dressing, 4x5 in (silver alginate) Discharge Instruction: Apply silver alginate to wound bed as instructed Secondary Dressing Woven Gauze Sponge, Non-Sterile 4x4 in Discharge Instruction: Apply over  primary dressing as directed. Secured With Conforming Stretch Gauze Bandage, Sterile 2x75 (in/in) Discharge Instruction: Secure with stretch gauze as directed. Compression Wrap Compression Stockings Add-Ons Wound #5 (Lower Leg) Wound Laterality: Right, Medial Cleanser Peri-Wound Care Topical Primary Dressing Secondary Dressing Secured With Compression Wrap Compression Stockings Add-Ons Electronic Signature(s) Signed: 02/20/2021 5:55:48 PM By: Linton Ham MD Signed: 02/21/2021 5:33:10 PM By: Levan Hurst RN, BSN Entered By: Linton Ham on 02/20/2021 17:41:16 -------------------------------------------------------------------------------- Multi-Disciplinary Care Plan Details Patient Name: Date of Service: [name] [name], [name] 02/20/2021 11:00 A M Medical Record Number: [id number] Patient Account Number: [id number] Date of Birth/Sex: Treating RN: 24-Aug-1951 (70 y.o. [name] Primary Care Provider: Renold Genta,  Stanton Kidney Other Clinician: Referring Provider: Treating Provider/Extender: [name] in Treatment: [place] reviewed with physician Active Inactive Wound/Skin Impairment Nursing Diagnoses: Impaired tissue integrity Knowledge deficit related to ulceration/compromised skin integrity Goals: Patient/caregiver will verbalize understanding of skin care regimen Date Initiated: 10/11/2016 Target Resolution Date: 03/24/2021 Goal Status: Active Interventions: Assess patient/caregiver ability to obtain necessary supplies Assess patient/caregiver ability to perform ulcer/skin care regimen upon admission and as needed Assess ulceration(s) every visit Provide education on ulcer and skin care Treatment Activities: Skin care regimen initiated : 10/11/2016 Topical wound management initiated : 10/11/2016 Notes: Electronic Signature(s) Signed: 02/20/2021 5:41:15 PM By: Levan Hurst RN, BSN Entered By: Levan Hurst on  02/20/2021 13:29:08 -------------------------------------------------------------------------------- Pain Assessment Details Patient Name: Date of Service: [name] [name] 02/20/2021 11:00 A M Medical Record Number: [id number] Patient Account Number: [id number] Date of Birth/Sex: Treating RN: November 13, 1951 (70 y.o. [name], [name] Primary Care Provider: [name] Other Clinician: Referring Provider: Treating Provider/Extender: [name] in Treatment: 465 Active Problems Location of Pain Severity and Description of Pain Patient Has Paino Yes Site Locations Pain Location: Pain in Ulcers With Dressing Change: Yes Duration of the Pain. Constant / Intermittento Intermittent Rate the pain. Current Pain Level: 8 Worst Pain Level: 10 Least Pain Level: 0 Tolerable Pain Level: 8 Character of Pain Describe the Pain: Aching Pain Management and Medication Current Pain Management: Medication: Yes Cold Application: No Rest: Yes Massage: No Activity: No T.E.N.S.: No Heat Application: No Leg drop or elevation: No Is the Current Pain Management Adequate: Adequate How does your wound impact your activities of daily livingo Sleep: No Bathing: No Appetite: No Relationship With Others: No Bladder Continence: No Emotions: No Bowel Continence: No Work: No Toileting: No Drive: No Dressing: No Hobbies: No Electronic Signature(s) Signed: 02/21/2021 5:43:17 PM By: Rhae Hammock RN Entered By: Rhae Hammock on 02/20/2021 11:40:57 -------------------------------------------------------------------------------- Patient/Caregiver Education Details Patient Name: Date of Service: [name] [name], [name] A N F. 2/21/2022andnbsp11:00 A M Medical Record Number: [id number] Patient Account Number: [id number] Date of Birth/Gender: Treating RN: Jul 20, 1951 (70 y.o. [name] Primary Care Physician: [name] Other Clinician: Referring  Physician: Treating Physician/Extender: [name] in Treatment: 47 Education Assessment Education Provided To: Patient Education Topics Provided Wound/Skin Impairment: Methods: Explain/Verbal Responses: State content correctly [employer]) Signed: 02/20/2021 5:41:15 PM By: Levan Hurst RN, BSN Entered By: Levan Hurst on 02/20/2021 13:29:33 -------------------------------------------------------------------------------- Wound Assessment Details Patient Name: Date of Service: [name] [name], [name] 02/20/2021 11:00 A M Medical Record Number: [id number] Patient Account Number: [id number] Date of Birth/Sex: Treating RN: 17-Jun-1951 (70 y.o. 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, Lateral Malleolus Wound Open Wounding Event: Gradually Appeared Status: Date Acquired: 05/02/2020 Comorbid Anemia, Hypertension, Peripheral Arterial Disease, Peripheral Weeks Of Treatment: 42 History: Venous Disease, Raynauds, Scleroderma, Rheumatoid Arthritis, Clustered Wound: No Osteoarthritis Wound Measurements Length: (cm) 0.8 Width: (cm) 0.9 Depth: (cm) 0.2 Area: (cm) 0.565 Volume: (cm) 0.113 % Reduction in Area: -28.4% % Reduction in Volume: -28.4% Epithelialization: None Tunneling: No Undermining: No Wound Description Classification: Full Thickness Without Exposed Support Structures Wound Margin: Distinct, outline attached Exudate Amount: Large Exudate Type: Serosanguineous Exudate Color: red, brown Foul Odor After Cleansing: No Slough/Fibrino Yes Wound Bed Granulation Amount: None Present (0%) Exposed Structure  Necrotic Amount: Large (67-100%) Fascia Exposed: No Necrotic Quality: Adherent Slough Fat Layer  (Subcutaneous Tissue) Exposed: Yes Tendon Exposed: No Muscle Exposed: No Joint Exposed: No Bone Exposed: No Treatment Notes Wound #20 (Malleolus) Wound Laterality: Left, Lateral Cleanser Soap and Water Discharge Instruction: May shower and wash wound with dial antibacterial soap and water prior to dressing change. Peri-Wound Care Sween Lotion (Moisturizing lotion) Discharge Instruction: Apply moisturizing lotion as directed Topical Primary Dressing Iodosorb Gel 10 (gm) Tube Discharge Instruction: Apply to wound bed as instructed Secondary Dressing Woven Gauze Sponge, Non-Sterile 4x4 in Discharge Instruction: Apply over primary dressing as directed. ABD Pad, 8x10 Discharge Instruction: Apply over primary dressing as directed. Secured With Compression Wrap Kerlix Roll 4.5x3.1 (in/yd) Discharge Instruction: Apply Kerlix and Coban compression as directed. Coban Self-Adherent Wrap 4x5 (in/yd) Discharge Instruction: Apply over Kerlix as directed. Compression Stockings Add-Ons Electronic Signature(s) Signed: 02/21/2021 5:43:17 PM By: Rhae Hammock RN Entered By: Rhae Hammock on 02/20/2021 11:38:13 -------------------------------------------------------------------------------- Wound Assessment Details Patient Name: Date of Service: [name] [name] 02/20/2021 11:00 A M Medical Record Number: [id number] Patient Account Number: [id number] Date of Birth/Sex: Treating RN: 05/18/1951 (70 y.o. [name], [name] Primary Care Provider: [name] Other Clinician: Referring Provider: Treating Provider/Extender: [name] in Treatment: 465 Wound Status Wound Number: 25 Primary Venous Leg Ulcer Etiology: Wound Location: Left, Lateral Foot Wound Open Wounding Event: Gradually Appeared Status: Date Acquired: 10/10/2020 Comorbid Anemia, Hypertension, Peripheral Arterial Disease, Peripheral Weeks Of Treatment: 19 History: Venous Disease,  Raynauds, Scleroderma, Rheumatoid Arthritis, Clustered Wound: No Osteoarthritis Wound Measurements Length: (cm) 0.7 Width: (cm) 0.7 Depth: (cm) 0.2 Area: (cm) 0.385 Volume: (cm) 0.077 % Reduction in Area: -2.1% % Reduction in Volume: -102.6% Epithelialization: None Tunneling: No Undermining: No Wound Description Classification: Full Thickness Without Exposed Support Structures Wound Margin: Distinct, outline attached Exudate Amount: Large Exudate Type: Purulent Exudate Color: yellow, brown, green Foul Odor After Cleansing: No Slough/Fibrino Yes Wound Bed Granulation Amount: None Present (0%) Exposed Structure Necrotic Amount: Large (67-100%) Fascia Exposed: No Necrotic Quality: Eschar, Adherent Slough Fat Layer (Subcutaneous Tissue) Exposed: Yes Tendon Exposed: No Muscle Exposed: No Joint Exposed: No Bone Exposed: No Treatment Notes Wound #25 (Foot) Wound Laterality: Left, Lateral Cleanser Soap and Water Discharge Instruction: May shower and wash wound with dial antibacterial soap and water prior to dressing change. Peri-Wound Care Sween Lotion (Moisturizing lotion) Discharge Instruction: Apply moisturizing lotion as directed Topical Primary Dressing Iodosorb Gel 10 (gm) Tube Discharge Instruction: Apply to wound bed as instructed Secondary Dressing Woven Gauze Sponge, Non-Sterile 4x4 in Discharge Instruction: Apply over primary dressing as directed. ABD Pad, 8x10 Discharge Instruction: Apply over primary dressing as directed. Secured With Compression Wrap Kerlix Roll 4.5x3.1 (in/yd) Discharge Instruction: Apply Kerlix and Coban compression as directed. Coban Self-Adherent Wrap 4x5 (in/yd) Discharge Instruction: Apply over Kerlix as directed. Compression Stockings Add-Ons Electronic Signature(s) Signed: 02/21/2021 5:43:17 PM By: Rhae Hammock RN Entered By: Rhae Hammock on 02/20/2021  11:36:49 -------------------------------------------------------------------------------- Wound Assessment Details Patient Name: Date of Service: [name] [name] 02/20/2021 11:00 A M Medical Record Number: [id number] Patient Account Number: [id number] Date of Birth/Sex: Treating RN: 1951/07/10 (70 y.o. [name], [name] Primary Care Provider: [name] Other Clinician: Referring Provider: Treating Provider/Extender: [name] in Treatment: [place] Wound Status Wound Number: 30 Primary Arterial Insufficiency Ulcer Etiology: Wound Location: Right T Fourth oe Wound Open Wounding Event: Gradually Appeared Status: Date Acquired: 12/26/2020 Comorbid Anemia, Hypertension, Peripheral Arterial  Disease, Peripheral Weeks Of Treatment: 8 History: Venous Disease, Raynauds, Scleroderma, Rheumatoid Arthritis, Clustered Wound: No Osteoarthritis Wound Measurements Length: (cm) 3 Width: (cm) 2.8 Depth: (cm) 0.1 Area: (cm) 6.597 Volume: (cm) 0.66 % Reduction in Area: -50% % Reduction in Volume: -50% Epithelialization: None Tunneling: No Undermining: No Wound Description Classification: Full Thickness Without Exposed Support Structures Wound Margin: Distinct, outline attached Exudate Amount: Medium Exudate Type: Serosanguineous Exudate Color: red, brown Foul Odor After Cleansing: No Slough/Fibrino Yes Wound Bed Granulation Amount: None Present (0%) Exposed Structure Necrotic Amount: Large (67-100%) Fascia Exposed: No Necrotic Quality: Eschar Fat Layer (Subcutaneous Tissue) Exposed: No Tendon Exposed: No Muscle Exposed: No Joint Exposed: No Bone Exposed: No Treatment Notes Wound #30 (Toe Fourth) Wound Laterality: Right Cleanser Soap and Water Discharge Instruction: May shower and wash wound with dial antibacterial soap and water prior to dressing change. Peri-Wound Care Sween Lotion (Moisturizing lotion) Discharge Instruction: Apply  moisturizing lotion as directed Topical Primary Dressing KerraCel Ag Gelling Fiber Dressing, 4x5 in (silver alginate) Discharge Instruction: Apply silver alginate to wound bed as instructed Secondary Dressing Woven Gauze Sponge, Non-Sterile 4x4 in Discharge Instruction: Apply over primary dressing as directed. Secured With Conforming Stretch Gauze Bandage, Sterile 2x75 (in/in) Discharge Instruction: Secure with stretch gauze as directed. Compression Wrap Compression Stockings Add-Ons Electronic Signature(s) Signed: 02/21/2021 5:43:17 PM By: Rhae Hammock RN Entered By: Rhae Hammock on 02/20/2021 11:31:11 -------------------------------------------------------------------------------- Wound Assessment Details Patient Name: Date of Service: [name] [name] 02/20/2021 11:00 A M Medical Record Number: [id number] Patient Account Number: [id number] Date of Birth/Sex: Treating RN: Jul 20, 1951 (70 y.o. [name], [name] Primary Care Provider: [name] Other Clinician: Referring Provider: Treating Provider/Extender: [name] in Treatment: [place] Wound Status Wound Number: 31 Primary Venous Leg Ulcer Etiology: Wound Location: Left, Medial Ankle Wound Open Wounding Event: Gradually Appeared Status: Date Acquired: 01/09/2021 Comorbid Anemia, Hypertension, Peripheral Arterial Disease, Peripheral Weeks Of Treatment: 6 History: Venous Disease, Raynauds, Scleroderma, Rheumatoid Arthritis, Clustered Wound: No Osteoarthritis Wound Measurements Length: (cm) 0.3 Width: (cm) 2 Depth: (cm) 0.2 Area: (cm) 0.471 Volume: (cm) 0.094 % Reduction in Area: 37.5% % Reduction in Volume: -25.3% Epithelialization: None Tunneling: No Undermining: No Wound Description Classification: Full Thickness Without Exposed Support Structures Wound Margin: Distinct, outline attached Exudate Amount: Large Exudate Type: Serosanguineous Exudate Color: red,  brown Foul Odor After Cleansing: No Slough/Fibrino Yes Wound Bed Granulation Amount: None Present (0%) Exposed Structure Necrotic Amount: Large (67-100%) Fascia Exposed: No Necrotic Quality: Adherent Slough Fat Layer (Subcutaneous Tissue) Exposed: Yes Tendon Exposed: No Muscle Exposed: No Joint Exposed: No Bone Exposed: No Treatment Notes Wound #31 ([place]) Wound Laterality: Left, Medial Cleanser Soap and Water Discharge Instruction: May shower and wash wound with dial antibacterial soap and water prior to dressing change. Peri-Wound Care Sween Lotion (Moisturizing lotion) Discharge Instruction: Apply moisturizing lotion as directed Topical Primary Dressing Iodosorb Gel 10 (gm) Tube Discharge Instruction: Apply to wound bed as instructed Secondary Dressing Woven Gauze Sponge, Non-Sterile 4x4 in Discharge Instruction: Apply over primary dressing as directed. ABD Pad, 8x10 Discharge Instruction: Apply over primary dressing as directed. Secured With Compression Wrap Kerlix Roll 4.5x3.1 (in/yd) Discharge Instruction: Apply Kerlix and Coban compression as directed. Coban Self-Adherent Wrap 4x5 (in/yd) Discharge Instruction: Apply over Kerlix as directed. Compression Stockings Add-Ons Electronic Signature(s) Signed: 02/21/2021 5:43:17 PM By: Rhae Hammock RN Entered By: Rhae Hammock on 02/20/2021 11:37:28 -------------------------------------------------------------------------------- Wound Assessment Details Patient Name: Date of Service: [name] [name], [name] A N F. 02/20/2021 11:00  A M Medical Record Number: [id number] Patient Account Number: [id number] Date of Birth/Sex: Treating RN: November 06, 1951 (71 y.o. [name], [name] Primary Care Provider: [name] Other Clinician: Referring Provider: Treating Provider/Extender: [name] in Treatment: [place] Wound Status Wound Number: 32 Primary Auto-immune Etiology: Wound Location: Right T  Great oe Wound Open Wounding Event: Gradually Appeared Status: Date Acquired: 01/05/2021 Comorbid Anemia, Hypertension, Peripheral Arterial Disease, Peripheral Weeks Of Treatment: 6 History: Venous Disease, Raynauds, Scleroderma, Rheumatoid Arthritis, Clustered Wound: No Osteoarthritis Wound Measurements Length: (cm) 4.5 Width: (cm) 3 Depth: (cm) 0.1 Area: (cm) 10.603 Volume: (cm) 1.06 % Reduction in Area: -1262.9% % Reduction in Volume: -1259% Epithelialization: None Tunneling: No Undermining: No Wound Description Classification: Full Thickness Without Exposed Support Structures Wound Margin: Distinct, outline attached Exudate Amount: Large Exudate Type: Purulent Exudate Color: yellow, brown, green Foul Odor After Cleansing: No Slough/Fibrino Yes Wound Bed Granulation Amount: None Present (0%) Exposed Structure Necrotic Amount: Large (67-100%) Fascia Exposed: No Necrotic Quality: Adherent Slough Fat Layer (Subcutaneous Tissue) Exposed: No Tendon Exposed: No Muscle Exposed: No Joint Exposed: No Bone Exposed: No Treatment Notes Wound #32 (Toe Great) Wound Laterality: Right Cleanser Soap and Water Discharge Instruction: May shower and wash wound with dial antibacterial soap and water prior to dressing change. Peri-Wound Care Sween Lotion (Moisturizing lotion) Discharge Instruction: Apply moisturizing lotion as directed Topical Primary Dressing KerraCel Ag Gelling Fiber Dressing, 4x5 in (silver alginate) Discharge Instruction: Apply silver alginate to wound bed as instructed Secondary Dressing Woven Gauze Sponge, Non-Sterile 4x4 in Discharge Instruction: Apply over primary dressing as directed. Secured With Conforming Stretch Gauze Bandage, Sterile 2x75 (in/in) Discharge Instruction: Secure with stretch gauze as directed. Compression Wrap Compression Stockings Add-Ons Electronic Signature(s) Signed: 02/21/2021 5:43:17 PM By: Rhae Hammock RN Entered By:  Rhae Hammock on 02/20/2021 11:31:49 -------------------------------------------------------------------------------- Wound Assessment Details Patient Name: Date of Service: [name] [name] 02/20/2021 11:00 A M Medical Record Number: [id number] Patient Account Number: [id number] Date of Birth/Sex: Treating RN: Aug 21, 1951 (70 y.o. [name], [name] Primary Care Provider: [name] Other Clinician: Referring Provider: Treating Provider/Extender: [name] in Treatment: [place] Wound Status Wound Number: 33 Primary Pressure Ulcer Etiology: Wound Location: Right T Second oe Wound Open Wounding Event: Pressure Injury Status: Date Acquired: 01/17/2021 Comorbid Anemia, Hypertension, Peripheral Arterial Disease, Peripheral Weeks Of Treatment: 4 History: Venous Disease, Raynauds, Scleroderma, Rheumatoid Arthritis, Clustered Wound: No Osteoarthritis Wound Measurements Length: (cm) 1.2 Width: (cm) 1 Depth: (cm) 0.1 Area: (cm) 0.942 Volume: (cm) 0.094 % Reduction in Area: -647.6% % Reduction in Volume: -623.1% Epithelialization: None Tunneling: No Undermining: No Wound Description Classification: Category/Stage III Wound Margin: Distinct, outline attached Exudate Amount: Large Exudate Type: Serosanguineous Exudate Color: red, brown Foul Odor After Cleansing: No Slough/Fibrino Yes Wound Bed Granulation Amount: None Present (0%) Exposed Structure Necrotic Amount: Large (67-100%) Fascia Exposed: No Necrotic Quality: Eschar, Adherent Slough Fat Layer (Subcutaneous Tissue) Exposed: Yes Tendon Exposed: No Muscle Exposed: No Joint Exposed: No Bone Exposed: No Treatment Notes Wound #33 (Toe Second) Wound Laterality: Right Cleanser Soap and Water Discharge Instruction: May shower and wash wound with dial antibacterial soap and water prior to dressing change. Peri-Wound Care Sween Lotion (Moisturizing lotion) Discharge Instruction: Apply  moisturizing lotion as directed Topical Primary Dressing KerraCel Ag Gelling Fiber Dressing, 4x5 in (silver alginate) Discharge Instruction: Apply silver alginate to wound bed as instructed Secondary Dressing Woven Gauze Sponge, Non-Sterile 4x4 in Discharge Instruction: Apply over primary dressing as directed. Secured With [employer]  Stretch Gauze Bandage, Sterile 2x75 (in/in) Discharge Instruction: Secure with stretch gauze as directed. Compression Wrap Compression Stockings Add-Ons Electronic Signature(s) Signed: 02/21/2021 5:43:17 PM By: Rhae Hammock RN Entered By: Rhae Hammock on 02/20/2021 11:32:45 -------------------------------------------------------------------------------- Wound Assessment Details Patient Name: Date of Service: [name] [name] 02/20/2021 11:00 A M Medical Record Number: [id number] Patient Account Number: [id number] Date of Birth/Sex: Treating RN: 1951-06-17 (70 y.o. 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 Third oe Wound Open Wounding Event: Gradually Appeared Status: Date Acquired: 02/10/2021 Comorbid Anemia, Hypertension, Peripheral Arterial Disease, Peripheral Weeks Of Treatment: 0 History: Venous Disease, Raynauds, Scleroderma, Rheumatoid Arthritis, Clustered Wound: No Osteoarthritis Wound Measurements Length: (cm) 1.4 Width: (cm) 2 Depth: (cm) 0.1 Area: (cm) 2.199 Volume: (cm) 0.22 % Reduction in Area: % Reduction in Volume: Epithelialization: None Tunneling: No Undermining: No Wound Description Classification: Full Thickness Without Exposed Support Structures Wound Margin: Distinct, outline attached Exudate Amount: Medium Exudate Type: Purulent Exudate Color: yellow, brown,  green Foul Odor After Cleansing: No Slough/Fibrino Yes Wound Bed Granulation Amount: None Present (0%) Exposed Structure Necrotic Amount: Large (67-100%) Fascia Exposed: No Necrotic Quality: Eschar, Adherent Slough Fat Layer (Subcutaneous Tissue) Exposed: No Tendon Exposed: No Muscle Exposed: No Joint Exposed: No Bone Exposed: No Treatment Notes Wound #34 (Toe Third) Wound Laterality: Right Cleanser Soap and Water Discharge Instruction: May shower and wash wound with dial antibacterial soap and water prior to dressing change. Peri-Wound Care Sween Lotion (Moisturizing lotion) Discharge Instruction: Apply moisturizing lotion as directed Topical Primary Dressing KerraCel Ag Gelling Fiber Dressing, 4x5 in (silver alginate) Discharge Instruction: Apply silver alginate to wound bed as instructed Secondary Dressing Woven Gauze Sponge, Non-Sterile 4x4 in Discharge Instruction: Apply over primary dressing as directed. Secured With Conforming Stretch Gauze Bandage, Sterile 2x75 (in/in) Discharge Instruction: Secure with stretch gauze as directed. Compression Wrap Compression Stockings Add-Ons Electronic Signature(s) Signed: 02/21/2021 5:43:17 PM By: Rhae Hammock RN Entered By: Rhae Hammock on 02/20/2021 11:28:29 -------------------------------------------------------------------------------- Wound Assessment Details Patient Name: Date of Service: [name] [name] 02/20/2021 11:00 A M Medical Record Number: [id number] Patient Account Number: [id number] Date of Birth/Sex: Treating RN: 1951-08-31 (70 y.o. 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 T Second oe Wound Open Wounding Event: Gradually Appeared Status: Date Acquired:  02/10/2021 Comorbid Anemia, Hypertension, Peripheral Arterial Disease, Peripheral Weeks Of Treatment: 0 History: Venous Disease, Raynauds, Scleroderma, Rheumatoid Arthritis, Clustered Wound: No Osteoarthritis Wound Measurements Length: (cm) 1.7 Width: (cm) 1 Depth: (cm) 0.1 Area: (cm) 1.335 Volume: (cm) 0.134 % Reduction in Area: % Reduction in Volume: Epithelialization: None Tunneling: No Undermining: No Wound Description Classification: Full Thickness Without Exposed Support Structures Wound Margin: Distinct, outline attached Exudate Amount: Medium Exudate Type: Purulent Exudate Color: yellow, brown, green Foul Odor After Cleansing: No Slough/Fibrino Yes Wound Bed Granulation Amount: None Present (0%) Exposed Structure Necrotic Amount: Large (67-100%) Fascia Exposed: No Necrotic Quality: Adherent Slough Fat Layer (Subcutaneous Tissue) Exposed: No Tendon Exposed: No Muscle Exposed: No Joint Exposed: No Bone Exposed: No Treatment Notes Wound #35 (Toe Second) Wound Laterality: Left Cleanser Soap and Water Discharge Instruction: May shower and wash wound with dial antibacterial soap and water prior  to dressing change. Peri-Wound Care Sween Lotion (Moisturizing lotion) Discharge Instruction: Apply moisturizing lotion as directed Topical Primary Dressing KerraCel Ag Gelling Fiber Dressing, 4x5 in (silver alginate) Discharge Instruction: Apply silver alginate to wound bed as instructed Secondary Dressing Woven Gauze Sponge, Non-Sterile 4x4 in Discharge Instruction: Apply over primary dressing as directed. Secured With Conforming Stretch Gauze Bandage, Sterile 2x75 (in/in) Discharge Instruction: Secure with stretch gauze as directed. Compression Wrap Compression Stockings Add-Ons Electronic Signature(s) Signed: 02/21/2021 5:43:17 PM By: Rhae Hammock RN Entered By: Rhae Hammock on 02/20/2021  11:35:52 -------------------------------------------------------------------------------- Vitals Details Patient Name: Date of Service: [name] [name], [name] 02/20/2021 11:00 A M Medical Record Number: [id number] Patient Account Number: [id number] Date of Birth/Sex: Treating RN: June 20, 1951 (70 y.o. [name], [name] Primary Care Provider: [name] Other Clinician: Referring Provider: Treating Provider/Extender: [name] in Treatment: 465 Vital Signs Time Taken: 11:39 Temperature (F): 98 Height (in): 68 Pulse (bpm): 74 Weight (lbs): 132 Respiratory Rate (breaths/min): 17 Body Mass Index (BMI): 20.1 Blood Pressure (mmHg): 137/79 Reference Range: 80 - 120 mg / dl Electronic Signature(s) Signed: 02/21/2021 5:43:17 PM By: Rhae Hammock RN Entered By: Rhae Hammock on 02/20/2021 11:39:55

## 2021-02-21 NOTE — Progress Notes (Signed)
Subjective:    [name], female    DOB: 10-20-1951, 70 y.o.   MRN: [id number]  HPI  70 year old Female for hospital follow up. To see plastic surgeon tomorrow. Saw ID physician yesterday. Labs reviewed so will not draw additional labs today.has been taking Hydrocodone APAP for pain.  Patient has history of CREST syndrome, pulmonary hypertension, venous stasis, history of DVT status post PVC filter, scleroderma.  She is on 3 L oxygen via nasal cannula for pulmonary fibrosis.  She recently underwent debridement of her right leg wound by Dr. [name] on February 09, 2021.  On February 7 she was admitted with diarrhea and poor appetite for the week previous to her presentation.  Creatinine was 2.0 which was elevated from baseline of 1.3.  She developed A. fib with RVR and heart rate was controlled with diltiazem.  She underwent debridement of right lower extremity wound on February 10 by Dr. [name], plastic surgeon.  She was discharged from the hospital on February 11.  This is why she is now here for follow-up.  A CBC and c-Met were requested at discharge to be done today.  Hemoglobin is stable at 9.9 g, white blood cell count elevated at 12,500, platelet count 402,000.  Was seen in follow-up by Crist Infante, PA at plastic surgery office on February 15.  Greenish hue to wound was noted with Mepitel mesh was removed.  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  They are to see infectious disease specialist in a few days.  They also are seeing Dr. Dellia Nims at the wound care center.  She is a bit depressed with this setback.    She has thought surgery would help.    She has chronic anemia.  Hemoglobin on February 11 was 8.7 g.  CBC checked on February 21 showed elevated white count of 12,500 and hemoglobin of 9.9 g.  C-reactive protein was 16.3 and had been 2.6 in January 2022.  Creatinine was elevated at 1.19 but has been higher earlier in the month.    She has a history of  intermittently low magnesium.  It was checked on February 11 and was normal at 2.2.  She does take magnesium supplement.     Review of Systems see above-no nausea or vomiting.     Objective:   Physical Exam Blood pressure 120/70 pulse 75 pulse oximetry 99% weight 119 pounds BMI 19.80  Her leg was not undressed today.  Her affect is slightly depressed.  Chest is clear.  Cardiac exam: Regular rate and rhythm.  Her husband is with her and is very supportive.     Assessment & Plan:  Scleroderma with nonhealing leg wound-followed by plastic surgery.  She is to see infectious disease consultant tomorrow.  She just had labs done yesterday.  She does have a leukocytosis with white blood cell count 12,500 which is a bit disturbing.  She has longstanding history of anemia and is depressed about the nonhealing leg wound.  She has hypothyroidism and hypertension.  Remote history of non-Hodgkin's lymphoma treated with chemotherapy in 2010 and felt to be cured.  History of osteoporosis treated with Prolia.  History of hypokalemia and hypomagnesemia in November 2020 when hospitalized with diarrhea.  This is resolved.  Remote history of melanoma.  History of iron deficiency and was prescribed iron supplement January 2022.  She had capsule endoscopy in November 2021 at [facility] showing some blood oozing from esophagitis.  At that time  was on chronic anticoagulation.  Hemoglobin dropped to 5.7 g in November 2021 and she had to have transfusion of 2 units packed red blood cells.  Esophagitis was thought to be a combination of food particles, reflux and Candida infection.  Was treated with nystatin and PPI.  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 Fredna Dow.  History of hearing loss but does not want to wear hearing aids.  See dictation 01/12/2021 for additional past medical history.  She is allergic to penicillin-causes a rash.  Sulfa causes her face to peel and  Levaquin causes flulike symptoms but she can take Cipro.  Plan: She will see Infectious Disease consultant tomorrow.  They are anxious to learn more about why her leg wound is not been healing. They are open to other options such as evaluation at a major medical center.

## 2021-02-22 ENCOUNTER — Encounter: Payer: Self-pay | Admitting: Surgical

## 2021-02-22 ENCOUNTER — Ambulatory Visit (INDEPENDENT_AMBULATORY_CARE_PROVIDER_SITE_OTHER): Payer: Medicare Other | Admitting: Surgical

## 2021-02-22 ENCOUNTER — Other Ambulatory Visit: Payer: Self-pay

## 2021-02-22 VITALS — BP 147/62 | HR 96

## 2021-02-22 DIAGNOSIS — M3481 Systemic sclerosis with lung involvement: Secondary | ICD-10-CM | POA: Diagnosis not present

## 2021-02-22 DIAGNOSIS — L97812 Non-pressure chronic ulcer of other part of right lower leg with fat layer exposed: Secondary | ICD-10-CM | POA: Diagnosis not present

## 2021-02-22 DIAGNOSIS — L03115 Cellulitis of right lower limb: Secondary | ICD-10-CM | POA: Diagnosis not present

## 2021-02-22 DIAGNOSIS — S81801A Unspecified open wound, right lower leg, initial encounter: Secondary | ICD-10-CM | POA: Diagnosis not present

## 2021-02-22 DIAGNOSIS — B952 Enterococcus as the cause of diseases classified elsewhere: Secondary | ICD-10-CM | POA: Diagnosis not present

## 2021-02-22 DIAGNOSIS — I87331 Chronic venous hypertension (idiopathic) with ulcer and inflammation of right lower extremity: Secondary | ICD-10-CM | POA: Diagnosis not present

## 2021-02-22 DIAGNOSIS — B965 Pseudomonas (aeruginosa) (mallei) (pseudomallei) as the cause of diseases classified elsewhere: Secondary | ICD-10-CM | POA: Diagnosis not present

## 2021-02-22 NOTE — Progress Notes (Signed)
Referring Provider Baxley, Cresenciano Lick, MD [address] [place],  [place] [id number]   CC:  Chief Complaint  Patient presents with  . Post-op Follow-up      [name] [name] is an 70 y.o. female.  HPI: 70 year old female with a history of crest syndrome, pulmonary hypertension, venous stasis, history of DVT status post PVC filter, significant vascular disease.  Patient is on 3 L of oxygen via nasal cannula. She presents for follow-up after undergoing debridement of her right leg wound and application of primatrix with Dr. [name] on 02/09/2021.  Patient was admitted during this time for dehydration/AKI  Patient has been evaluated by infectious disease.  They recommend avoiding further antibiotics at this time given the lack of response to cefepime.  She has chronic colonization with Pseudomonas.  Patient presents today with her husband. They report they have been doing every other day dressing changes with silver alginate followed by 4 x 4 gauze, Kerlix and Coban wraps. She has been trying to ambulate it, but reports it is painful with ambulation. She also reports that she has been using Tylenol and occasional hydrocodone for pain control. She reports she is not having any infectious symptoms. She does report that she had one episode of vomiting the other day, but no recent episodes.  Review of Systems General: No f/c  Physical Exam Vitals with BMI 02/21/2021 02/20/2021 02/14/2021  Height _0  - -  Weight 119 lbs - -  BMI 27.0 - -  Systolic 350 093 818  Diastolic 70 59 74  Pulse 75 70 78    General:  No acute distress,  Alert and oriented, Non-Toxic, Normal speech and affect Pulm: On nasal cannula Right lower extremity: Right lower extremity with chronic wound. She has necrosis of the fourth toe distally. She also has some wounds on the first second and third toes. She has some chronic erythema of the entire right lower extremity distal to the knee. New satellite lesions noted  throughout the right lower extremity. She had a thick layer of drainage/exudate over the right lateral malleoli and approximately which eroded the superficial layer of her epithelium. I do not notice any foul odor on exam today, however I was masked. She has green drainage throughout the majority of the primary wound on the right lateral posterior leg.  Assessment/Plan   Patient presents today for evaluation of her right lower extremity. She reports that she has been using Tylenol and hydrocodone intermittently as needed for pain. She reports difficulty with sleeping at night due to the pain. She reports difficulty with elevation of the leg due to pain as well. She has seen infectious disease and they recommend holding off on antibiotics at this time. They have been using silver alginate dressings every other day. She has a significant amount of bioburden and drainage which is causing some erosion of the distal portion of her leg. I do not see any active purulence today on exam. The majority of the wound matrix has either incorporated or sloughed off, the base of the wound bed has significant amount of fibrinous exudate and drainage. I did discuss with the patient today that it does not appear to be responding well to recent debridement and I do not notice much improvement overall. I did discuss with them that amputation is a possibility and very likely. I discussed with them that I would like to send in a referral for them to meet with Ortho team to discuss amputation/evaluate based on their  opinion. She has had a history of an amputation of her fifth toe, reports this was in 1998 by Dr. Sharol Given.  I did debride the right lower extremity using pickups. The overall area was approximately 6 x 4 cm.  I would like him to follow-up in 1 week, we will reevaluate at that time. I think at this time they would like to continue with conservative measures and local wound care to see if there is much improvement, but they  are understanding of my position on how things are doing. They were very pleasant and I recommended they call with any questions or concerns or if anything changes over the next week. Pictures were obtained and placed in the patient's chart with her permission. We sent a form for instructions on wound care with them and will fax 1 to home health to assist them with dressing changes twice per week. We will do alginate dressing changes every other day followed by 4 x 4 gauze, Kerlix, Coban wraps. Recommend elevating as able. Recommend using Tylenol and hydrocodone as needed for pain.     [name] [name] 02/22/2021, 1:54 PM

## 2021-02-23 ENCOUNTER — Telehealth: Payer: Self-pay

## 2021-02-23 DIAGNOSIS — M3481 Systemic sclerosis with lung involvement: Secondary | ICD-10-CM | POA: Diagnosis not present

## 2021-02-23 DIAGNOSIS — I87331 Chronic venous hypertension (idiopathic) with ulcer and inflammation of right lower extremity: Secondary | ICD-10-CM | POA: Diagnosis not present

## 2021-02-23 DIAGNOSIS — L03115 Cellulitis of right lower limb: Secondary | ICD-10-CM | POA: Diagnosis not present

## 2021-02-23 DIAGNOSIS — B952 Enterococcus as the cause of diseases classified elsewhere: Secondary | ICD-10-CM | POA: Diagnosis not present

## 2021-02-23 DIAGNOSIS — L97812 Non-pressure chronic ulcer of other part of right lower leg with fat layer exposed: Secondary | ICD-10-CM | POA: Diagnosis not present

## 2021-02-23 DIAGNOSIS — B965 Pseudomonas (aeruginosa) (mallei) (pseudomallei) as the cause of diseases classified elsewhere: Secondary | ICD-10-CM | POA: Diagnosis not present

## 2021-02-23 NOTE — Telephone Encounter (Signed)
Faxed wound changes to [facility]: skilled nursing visits 2 times a week. Patient will need to change dressings every other day: Alginate, 4x4, Kerlix, and coban. Husband will change dressings the day [facility]HC does not come.

## 2021-02-27 ENCOUNTER — Other Ambulatory Visit (INDEPENDENT_AMBULATORY_CARE_PROVIDER_SITE_OTHER): Payer: Medicare Other

## 2021-02-27 ENCOUNTER — Encounter (HOSPITAL_BASED_OUTPATIENT_CLINIC_OR_DEPARTMENT_OTHER): Payer: Medicare Other | Admitting: Internal Medicine

## 2021-02-27 ENCOUNTER — Telehealth: Payer: Self-pay | Admitting: *Deleted

## 2021-02-27 DIAGNOSIS — R8290 Unspecified abnormal findings in urine: Secondary | ICD-10-CM

## 2021-02-27 DIAGNOSIS — L97812 Non-pressure chronic ulcer of other part of right lower leg with fat layer exposed: Secondary | ICD-10-CM | POA: Diagnosis not present

## 2021-02-27 DIAGNOSIS — I87331 Chronic venous hypertension (idiopathic) with ulcer and inflammation of right lower extremity: Secondary | ICD-10-CM | POA: Diagnosis not present

## 2021-02-27 DIAGNOSIS — B965 Pseudomonas (aeruginosa) (mallei) (pseudomallei) as the cause of diseases classified elsewhere: Secondary | ICD-10-CM | POA: Diagnosis not present

## 2021-02-27 DIAGNOSIS — B952 Enterococcus as the cause of diseases classified elsewhere: Secondary | ICD-10-CM | POA: Diagnosis not present

## 2021-02-27 DIAGNOSIS — M3481 Systemic sclerosis with lung involvement: Secondary | ICD-10-CM | POA: Diagnosis not present

## 2021-02-27 DIAGNOSIS — L03115 Cellulitis of right lower limb: Secondary | ICD-10-CM | POA: Diagnosis not present

## 2021-02-27 LAB — POCT URINALYSIS DIPSTICK
Appearance: NEGATIVE
Bilirubin, UA: NEGATIVE
Blood, UA: NEGATIVE
Glucose, UA: NEGATIVE
Ketones, UA: NEGATIVE
Leukocytes, UA: NEGATIVE
Nitrite, UA: POSITIVE
Odor: NEGATIVE
Protein, UA: POSITIVE — AB
Spec Grav, UA: 1.01 (ref 1.010–1.025)
Urobilinogen, UA: 0.2 E.U./dL
pH, UA: 6.5 (ref 5.0–8.0)

## 2021-02-27 NOTE — Telephone Encounter (Signed)
Received Orders from [facility] requesting signature and return.  Orders given to provider to sign.  Orders signed and faxed back to [facility].  Confirmation received and copy scanned into the chart.//AB/CMA

## 2021-02-27 NOTE — Patient Instructions (Signed)
She will see infectious disease consultant tomorrow .  She will be in touch with me after that visit.

## 2021-03-01 ENCOUNTER — Encounter: Payer: Self-pay | Admitting: Surgical

## 2021-03-01 ENCOUNTER — Other Ambulatory Visit: Payer: Self-pay

## 2021-03-01 ENCOUNTER — Ambulatory Visit (INDEPENDENT_AMBULATORY_CARE_PROVIDER_SITE_OTHER): Payer: Medicare Other | Admitting: Surgical

## 2021-03-01 VITALS — BP 155/88

## 2021-03-01 DIAGNOSIS — I7301 Raynaud's syndrome with gangrene: Secondary | ICD-10-CM | POA: Diagnosis not present

## 2021-03-01 DIAGNOSIS — M340 Progressive systemic sclerosis: Secondary | ICD-10-CM | POA: Diagnosis not present

## 2021-03-01 DIAGNOSIS — Z681 Body mass index (BMI) 19 or less, adult: Secondary | ICD-10-CM | POA: Diagnosis not present

## 2021-03-01 DIAGNOSIS — D638 Anemia in other chronic diseases classified elsewhere: Secondary | ICD-10-CM | POA: Diagnosis not present

## 2021-03-01 DIAGNOSIS — S81801D Unspecified open wound, right lower leg, subsequent encounter: Secondary | ICD-10-CM | POA: Diagnosis not present

## 2021-03-01 DIAGNOSIS — M79672 Pain in left foot: Secondary | ICD-10-CM | POA: Diagnosis not present

## 2021-03-01 DIAGNOSIS — I272 Pulmonary hypertension, unspecified: Secondary | ICD-10-CM | POA: Diagnosis not present

## 2021-03-01 DIAGNOSIS — G629 Polyneuropathy, unspecified: Secondary | ICD-10-CM | POA: Diagnosis not present

## 2021-03-01 DIAGNOSIS — S81801A Unspecified open wound, right lower leg, initial encounter: Secondary | ICD-10-CM | POA: Diagnosis not present

## 2021-03-01 DIAGNOSIS — K219 Gastro-esophageal reflux disease without esophagitis: Secondary | ICD-10-CM | POA: Diagnosis not present

## 2021-03-01 DIAGNOSIS — M810 Age-related osteoporosis without current pathological fracture: Secondary | ICD-10-CM | POA: Diagnosis not present

## 2021-03-01 NOTE — Progress Notes (Signed)
   Referring Provider Baxley, Cresenciano Lick, MD [address] [place],  [place] [address]   CC:  Chief Complaint  Patient presents with  . Follow-up      [name] [name] is an 70 y.o. female.  HPI: Patient is a 70 year old female here for follow-up on her nonhealing right lower extremity wound. She has a history of crest syndrome, pulmonary hypertension, venous stasis, history of DVT status post IVC filter, significant vascular disease.  She has previously been referred to orthopedic team for evaluation/discussion of possible amputation of the right lower extremity due to the severity of her right lower extremity wound and the poor disposition.  Patient is receiving home health assistance with dressing changes. Patient reports overall she is doing okay today, reports she has an appointment with Dr. Sharol Given next week to discuss options.  She also reports she has a follow-up with wound care center, Dr. Dellia Nims in 2 weeks.  She is not having any infectious symptoms.  She is here with her husband.  Review of Systems General: No fevers, chills  Physical Exam Vitals with BMI 03/01/2021 02/22/2021 02/21/2021  Height - - _0   Weight - - 119 lbs  BMI - - 95.9  Systolic 747 185 501  Diastolic 88 62 70  Pulse - 96 75    General:  No acute distress,  Alert and oriented, Non-Toxic, Normal speech and affect Right lower extremity: Significant right lower extremity wounds.  Wounds extend from distal toes to proximal calf.  Significant amount of fibrinous brown/yellow exudate noted throughout the wound beds.  No foul odor is noted today.  No cellulitic changes, but chronic erythema is noted.  The right lower extremity is very tender to palpation. No thickened eschar is noted.  Necrotic digit noted.  Crepitus noted with palpation of the entire leg.  Swelling is noted. Tendon is exposed within the wound bed of the large wound on the distal calf.  Assessment/Plan  Patient is scheduled for assessment  with orthopedic team next week for discussion of possible amputation/another opinion.  She is also scheduled to see the wound care center in 2 weeks for reevaluation.  She has been followed by the wound care center prior to her consultation with [place] in January 2022.  Recommend continuing with alginate dressing changes every other day.  Change outer dressings as needed due to drainage.  She has been receiving assistance with home health.  Pictures were taken and placed in the patient's chart with patient permission.  I recommend they follow-up with [place] in 2 weeks as well, recommend calling with any questions or concerns.  We remain available as needed.  [name] 03/01/2021, 12:14 PM

## 2021-03-02 ENCOUNTER — Other Ambulatory Visit: Payer: Self-pay | Admitting: Hematology and Oncology

## 2021-03-02 ENCOUNTER — Ambulatory Visit (HOSPITAL_COMMUNITY)
Admission: RE | Admit: 2021-03-02 | Discharge: 2021-03-02 | Disposition: A | Discharge status: Home / Self Care | Payer: Medicare Other | Source: Ambulatory Visit | Attending: Internal Medicine | Admitting: Internal Medicine

## 2021-03-02 ENCOUNTER — Encounter (HOSPITAL_COMMUNITY): Payer: Self-pay | Admitting: Internal Medicine

## 2021-03-02 VITALS — BP 136/50 | HR 70 | Wt 118.0 lb

## 2021-03-02 DIAGNOSIS — Z7952 Long term (current) use of systemic steroids: Secondary | ICD-10-CM | POA: Diagnosis not present

## 2021-03-02 DIAGNOSIS — J849 Interstitial pulmonary disease, unspecified: Secondary | ICD-10-CM | POA: Insufficient documentation

## 2021-03-02 DIAGNOSIS — I272 Pulmonary hypertension, unspecified: Secondary | ICD-10-CM

## 2021-03-02 DIAGNOSIS — L97919 Non-pressure chronic ulcer of unspecified part of right lower leg with unspecified severity: Secondary | ICD-10-CM

## 2021-03-02 DIAGNOSIS — I5032 Chronic diastolic (congestive) heart failure: Secondary | ICD-10-CM

## 2021-03-02 DIAGNOSIS — Z7902 Long term (current) use of antithrombotics/antiplatelets: Secondary | ICD-10-CM | POA: Diagnosis not present

## 2021-03-02 DIAGNOSIS — Z79899 Other long term (current) drug therapy: Secondary | ICD-10-CM | POA: Insufficient documentation

## 2021-03-02 DIAGNOSIS — I110 Hypertensive heart disease with heart failure: Secondary | ICD-10-CM | POA: Insufficient documentation

## 2021-03-02 DIAGNOSIS — I739 Peripheral vascular disease, unspecified: Secondary | ICD-10-CM

## 2021-03-02 DIAGNOSIS — D500 Iron deficiency anemia secondary to blood loss (chronic): Secondary | ICD-10-CM

## 2021-03-02 DIAGNOSIS — Z86718 Personal history of other venous thrombosis and embolism: Secondary | ICD-10-CM | POA: Diagnosis not present

## 2021-03-02 DIAGNOSIS — I480 Paroxysmal atrial fibrillation: Secondary | ICD-10-CM

## 2021-03-02 DIAGNOSIS — I82401 Acute embolism and thrombosis of unspecified deep veins of right lower extremity: Secondary | ICD-10-CM | POA: Diagnosis not present

## 2021-03-02 DIAGNOSIS — M349 Systemic sclerosis, unspecified: Secondary | ICD-10-CM | POA: Insufficient documentation

## 2021-03-02 DIAGNOSIS — Z9981 Dependence on supplemental oxygen: Secondary | ICD-10-CM | POA: Insufficient documentation

## 2021-03-02 DIAGNOSIS — I2721 Secondary pulmonary arterial hypertension: Secondary | ICD-10-CM | POA: Insufficient documentation

## 2021-03-02 HISTORY — DX: Heart failure, unspecified: I50.9

## 2021-03-02 NOTE — Progress Notes (Signed)
Advanced Heart Failure Clinic Note   Date:  03/02/2021   ID:  [name] [name], DOB [birth year], MRN [id number]  Location: Home  Provider location: [facility] Type of Visit: Established patient  PCP:  [name], MD  Cardiologist:  Glori Bickers, MD Primary HF:   Chief Complaint: Heart Failure follow-up   History of Present Illness:  [name] [name] is a 70 y.o. 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 PAF, RLE DVT and severe PAD with recurrent leg wounds.   PAH Meds: 1. Started on IV epoprostenol (Veletri) in 01/2013. She had intolerable side effects with Veletri.   2. Stopped Tyvaso in 2015. 3. February 2016 switched bosentan to Macitentan 4. Macitentan 10 and sildenafil 60 tid (did not tolerate Adcirca).  5. 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   Had a fall in 11/21 broke her pelvis and left wrist.   RLE u/s 1/22: Findings consistent with chronic deep vein thrombosis involving the right femoral vein. Unable to tolerate AC due to bleeding -> IVC filter placed   VQ 11/21 negative.  Admitted 2/22 with dehydration, AKI worsening RL wound. Underwent IVF hydration and debridement of RLE wound. While in hospital developed PAF. Treated with IV diltiazem. Refused anticoagulation due to previous bleeding. Echo 2/22: LVEF 55% RV severely dilated and HK. RVSP 15mHG  Here for hospital f/u with her husband. Doing ok. Still struggling with LE wound but feels it is healing slowly. Mobility limited but able to do ADLs. No recurrence of AF. Denies CP or SOB. Mild edema in RLE  Echo 3/21: LVEF 60-65% RV mildly dilated but function normal Personally reviewed Echo 01/16/2019 LVEF 60-65%, PA peak pressure 58 mm Hg  Echo 12/18 EF 60-65% RV dilated with mildly to moderately  decreased function   Studies  6 MW 04/18/12 880 feet 6 MW 11/10/12 650 feet. Limited by legs. Sats down to 85% on 5L (sats at rest 95%)  6 MW 3/15 915 feet (279 m) 6 MW 2/16 930 feet (283 m)   RHC 04/19/17  RA = 4 RV = 80/8 PA = 79/17 (43) PCW = 6 Fick cardiac output/index = 4.1/2.4 PVR = 9.0 WU Ao sat = 94% PA sat = 59%, 62%    Past Medical History:  Diagnosis Date  . Anemia   . Arthritis   . CHF (congestive heart failure) ([place]   . DOE (dyspnea on exertion)   . Epistaxis   . History of chicken pox   . Hypertension   . Hypothyroidism   . Leg ulcer ([place]   . Melanoma ([place] 1999  . 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[place]   . Non Hodgkin's lymphoma ([place]    Treated with chemo 2010, felt to be cured.  .Marland KitchenPAH (pulmonary artery hypertension) ([place]   . Peripheral arterial occlusive disease ([place]   . Pneumonia 2008  . Pulmonary hypertension ([place]   . Requires supplemental oxygen    2 liters-3liters when moving  . [facility] ([facility]    Past Surgical History:  Procedure Laterality Date  . ABDOMINAL AORTOGRAM W/LOWER EXTREMITY Bilateral 10/04/2020   Procedure: ABDOMINAL AORTOGRAM W/LOWER EXTREMITY;  Surgeon: BSerafina Mitchell MD;  [facility];  Service: Cardiovascular;  Laterality: Bilateral;  . ABDOMINAL AORTOGRAM W/LOWER EXTREMITY Bilateral 01/02/2021  Procedure: ABDOMINAL AORTOGRAM W/LOWER EXTREMITY;  Surgeon: Waynetta Sandy, MD;  Location: [place] CV LAB;  Service: Cardiovascular;  Laterality: Bilateral;  . AMPUTATION Right 10/24/2017   Procedure: RAY resection right index;  Surgeon: Daryll Brod, MD;  Location: [place];  Service: Orthopedics;  Laterality: Right;  Axillary block  . AMPUTATION Right 11/21/2018   Procedure: AMPUTATION RAY RIGHT MIDDLE FINGER;  Surgeon: Daryll Brod, MD;  Location: [place];  Service: Orthopedics;  Laterality: Right;  . AMPUTATION Right 10/22/2019   Procedure: AMPUTATION DIGIT RIGHT RING AND  SMALL FINGER, DIGITAL SYMPATHECTOMY RIGHT RADIAL ULNAR ARCH;  Surgeon: Daryll Brod, MD;  Location: [place];  Service: Orthopedics;  Laterality: Right;  AXILARY  . apligraph  12-04-2102,   12-18-2012   bilateral calves   [name]  . APPLICATION OF A-CELL OF EXTREMITY Right 02/09/2021   Procedure: application of primatrix mesh;  Surgeon: Cindra Presume, MD;  Location: [place];  Service: Plastics;  Laterality: Right;  . biopsy on cerebellum    . COLONOSCOPY WITH PROPOFOL Left 10/07/2015   Procedure: COLONOSCOPY WITH PROPOFOL;  Surgeon: [name], MD;  Location: [place];  Service: Endoscopy;  Laterality: Left;  . ENDARTERECTOMY FEMORAL Right 09/14/2016   Procedure: ENDARTERECTOMY FEMORAL RIGHT;  Surgeon: [name], MD;  Location: [facility] OR;  Service: Vascular;  Laterality: Right;  . ENDOVENOUS ABLATION SAPHENOUS VEIN W/ LASER  07-24-2012   right greater saphenous vein by [name], M.D.   . ENDOVENOUS ABLATION SAPHENOUS VEIN W/ LASER  10-16-2012   left greater saphenous vein  by Dr. [name]  . ENDOVENOUS ABLATION SAPHENOUS VEIN W/ LASER Right 09-01-2015   endovenous laser ablation right greater saphenous vein by [name] MD  . ESOPHAGOGASTRODUODENOSCOPY N/A 05/07/2016   Procedure: ESOPHAGOGASTRODUODENOSCOPY (EGD);  Surgeon: [name], MD;  Location: [facility] ENDOSCOPY;  Service: Endoscopy;  Laterality: N/A;  . ESOPHAGOGASTRODUODENOSCOPY N/A 11/23/2020   Procedure: ESOPHAGOGASTRODUODENOSCOPY (EGD);  Surgeon: [name], MD;  [facility];  Service: Endoscopy;  Laterality: N/A;  . ESOPHAGOGASTRODUODENOSCOPY (EGD) WITH PROPOFOL Left 10/07/2015   Procedure: ESOPHAGOGASTRODUODENOSCOPY (EGD) WITH PROPOFOL;  Surgeon: [name], MD;  Location: [place];  Service: Endoscopy;  Laterality: Left;  . GIVENS CAPSULE STUDY N/A 07/31/2016   Procedure: GIVENS CAPSULE STUDY;  Surgeon: [name], MD;  Location: [facility] ENDOSCOPY;  Service: Endoscopy;  Laterality: N/A;  . GIVENS  CAPSULE STUDY N/A 11/23/2020   Procedure: GIVENS CAPSULE STUDY;  Surgeon: [name], MD;  Location: [facility] ENDOSCOPY;  Service: Endoscopy;  Laterality: N/A;  . I & D EXTREMITY Right 02/09/2021   Procedure: Debridement of right lower extremity wound;  Surgeon: Cindra Presume, MD;  Location: [place];  Service: Plastics;  Laterality: Right;  1 hour, please  . IR IVC FILTER PLMT / S&I /IMG GUID/MOD SED  11/22/2020  . LOWER EXTREMITY ANGIOGRAPHY N/A 10/25/2020   Procedure: LOWER EXTREMITY ANGIOGRAPHY - Left;  Surgeon: Serafina Mitchell, MD;  Location: [place] CV LAB;  Service: Cardiovascular;  Laterality: N/A;  . melonoma removes Right    behind right knee  . OPEN REDUCTION INTERNAL FIXATION (ORIF) DISTAL RADIAL FRACTURE Left 11/24/2019   Procedure: OPEN REDUCTION INTERNAL FIXATION (ORIF) DISTAL RADIAL FRACTURE;  Surgeon: Altamese [place], MD;  Location: [place];  Service: Orthopedics;  Laterality: Left;  . PATCH ANGIOPLASTY Right 09/14/2016   Procedure: PATCH ANGIOPLASTY RIGHT FEMORAL ARTERY USING HEMASHIELD PLATINUM FINESSE PATCH;  Surgeon: [name], MD;  Location: [place];  Service:  Vascular;  Laterality: Right;  . PERIPHERAL VASCULAR BALLOON ANGIOPLASTY Right 10/04/2020   Procedure: PERIPHERAL VASCULAR BALLOON ANGIOPLASTY;  Surgeon: Serafina Mitchell, MD;  Location: [place] CV LAB;  Service: Cardiovascular;  Laterality: Right;  SFA/PT/AT/PTtrunk  . PERIPHERAL VASCULAR BALLOON ANGIOPLASTY Left 10/25/2020   Procedure: PERIPHERAL VASCULAR BALLOON ANGIOPLASTY;  Surgeon: Serafina Mitchell, MD;  Location: [place] CV LAB;  Service: Cardiovascular;  Laterality: Left;  anterior tibial and tibioperoneal trunk  . PERIPHERAL VASCULAR CATHETERIZATION N/A 09/07/2016   Procedure: Abdominal Aortogram w/Lower Extremity;  Surgeon: [name], MD;  Location: [place] CV LAB;  Service: Cardiovascular;  Laterality: N/A;  . RIGHT HEART CATH N/A 04/19/2017   Procedure: Right Heart Cath;  Surgeon: [name], MD;  Location: [place] CV LAB;  Service: Cardiovascular;  Laterality: N/A;  . RIGHT HEART CATHETERIZATION N/A 11/14/2012   Procedure: RIGHT HEART CATH;  Surgeon: [name], MD;  [facility] CATH LAB;  Service: Cardiovascular;  Laterality: N/A;  . rt little toe removal  10/1998  . stent left thigh  3/11  . TENDON TRANSFER Right 10/24/2017   Procedure: transfer extensor indecus propius/extensor digitorum commomus index to middle ring and small;  Surgeon: Daryll Brod, MD;  Location: [place];  Service: Orthopedics;  Laterality: Right;  . TONSILLECTOMY    . TONSILLECTOMY AND ADENOIDECTOMY    . TUNNELED VENOUS CATHETER PLACEMENT  01/2013  . ULNAR HEAD EXCISION Right 10/24/2017   Procedure: darrach right ulna;  Surgeon: Daryll Brod, MD;  Location: [place];  Service: Orthopedics;  Laterality: Right;  . VEIN LIGATION AND STRIPPING  05/16/2012   Procedure: VEIN LIGATION AND STRIPPING;  Surgeon: [name], MD;  Location: [facility] OR;  Service: Vascular;  Laterality: Right;  Irrigation and Debridement right lower leg, ligation of saphenous vein.     Current Outpatient Medications  Medication Sig Dispense Refill  . acetaminophen (TYLENOL) 650 MG CR tablet Take 1,300 mg by mouth every 8 (eight) hours as needed for pain.    . Biotin 5000 MCG CAPS Take 5,000 mcg by mouth daily.    . Calcium Carbonate-Vit D-Min (CALCIUM 1200 PO) Take 1 tablet by mouth at bedtime.    . Cholecalciferol (VITAMIN D) 2000 units CAPS Take 2,000 Units by mouth at bedtime.    . citalopram (CELEXA) 20 MG tablet Take 1 tablet (20 mg total) by mouth at bedtime. 30 tablet 0  . clopidogrel (PLAVIX) 75 MG tablet TAKE 1 TABLET BY MOUTH EVERY DAY 90 tablet 3  . cycloSPORINE (RESTASIS) 0.05 % ophthalmic emulsion Place 2 drops into both eyes 2 (two) times daily.    Marland Kitchen diltiazem (CARDIZEM CD) 180 MG 24 hr capsule Take 1 capsule (180 mg total) by mouth daily. 30 capsule 3  . Ensure Max Protein (ENSURE MAX PROTEIN) LIQD Take 330  mLs (11 oz total) by mouth at bedtime. 330 mL 0  . FERREX 150 150 MG capsule TAKE 1 CAPSULE BY MOUTH TWICE A DAY 180 capsule 1  . fish oil-omega-3 fatty acids 1000 MG capsule Take 1,000 mg by mouth 2 (two) times daily.    . folic acid (FOLVITE) 1 MG tablet TAKE 1 TABLET BY MOUTH EVERY DAY 90 tablet 1  . HYDROcodone-acetaminophen (NORCO) 5-325 MG tablet Take 1 tablet by mouth as needed for severe pain. (Do not exceed 1 tablet every 8 hours) 15 tablet 0  . levothyroxine (SYNTHROID) 50 MCG tablet TAKE 1 TABLET BY MOUTH DAILY BEFORE BREAKFAST 90 tablet 1  .  loperamide (IMODIUM) 2 MG capsule Take 2-4 mg by mouth 4 (four) times daily as needed for diarrhea or loose stools.    Marland Kitchen MAGNESIUM ASPARTATE PO Take 150 mg by mouth in the morning and at bedtime.    . Multiple Vitamin (MULTIVITAMIN WITH MINERALS) TABS tablet Take 1 tablet by mouth daily at 12 noon.    . nutrition supplement, JUVEN, (JUVEN) PACK Take 1 packet by mouth 2 (two) times daily between meals. 30 packet 0  . ondansetron (ZOFRAN) 4 MG tablet Take 1 tablet (4 mg total) by mouth every 8 (eight) hours as needed for nausea or vomiting. 20 tablet 0  . OPSUMIT 10 MG tablet Take 1 tablet (70m) by mouth daily. 30 tablet 11  . OXYGEN Inhale 2-3 L into the lungs continuous.     . pantoprazole (PROTONIX) 40 MG tablet Take 1 tablet (40 mg total) by mouth 2 (two) times daily. 180 tablet 3  . potassium chloride 20 MEQ/15ML (10%) SOLN TAKE 2 TEASPOONFUL (DILUTED IN WATER) ONCE DAILY 473 mL 11  . predniSONE (DELTASONE) 5 MG tablet Take 5 mg by mouth daily.    . rosuvastatin (CRESTOR) 5 MG tablet Take 1 tablet every Monday, Wednesday, and Friday. 45 tablet 3  . sildenafil (REVATIO) 20 MG tablet Take 3 tablets (60 mg total) by mouth 3 (three) times daily. 810 tablet 3  . torsemide (DEMADEX) 20 MG tablet Take 20-40 mg by mouth See admin instructions. Take 2 tablets (40 mg) by mouth in the morning & take 1 tablet (20 mg) by mouth at night.    . valACYclovir  (VALTREX) 500 MG tablet Take 500 mg by mouth as needed.    . vitamin B-12 (CYANOCOBALAMIN) 1000 MCG tablet Take 1,000 mcg by mouth daily.     No current facility-administered medications for this encounter.    Allergies:   Levofloxacin, Sulfa antibiotics, Doxycycline hyclate, and Penicillins   Social History:  The patient  reports that she has never smoked. She has never used smokeless tobacco. She reports current alcohol use of about 7.0 standard drinks of alcohol per week. She reports that she does not use drugs.   Family History:  The patient's family history includes Cancer in her daughter; Colon cancer in an other family member; Hyperlipidemia in her mother; Lupus in her father; Lymphoma in her father.   ROS:  Please see the history of present illness.   All other systems are personally reviewed and negative.   Vitals:   03/02/21 1502  BP: (!) 136/50  Pulse: 70  SpO2: 95%  Weight: 53.5 kg (118 lb)   Exam:   General:  Weak appearing. On O2 No resp difficulty HEENT: normal Neck: supple. no JVD. Carotids 2+ bilat; no bruits. No lymphadenopathy or thryomegaly appreciated. Cor: PMI nondisplaced. Regular rate & rhythm. No rubs, gallops or murmurs. Lungs: clear Abdomen: soft, nontender, nondistended. No hepatosplenomegaly. No bruits or masses. Good bowel sounds. Extremities: no cyanosis, clubbing, rash, RLE wrapped. Mild edema  Diffuse scleroderma changes. Missing 3 fingers on left  Neuro: alert & orientedx3, cranial nerves grossly intact. moves all 4 extremities w/o difficulty. Affect pleasant   Recent Labs: 11/21/2020: Pro B Natriuretic peptide (BNP) 526.0 02/08/2021: TSH 1.460 02/10/2021: Magnesium 2.2 02/20/2021: ALT 12; BUN 31; Creat 1.19; Hemoglobin 9.9; Platelets 402; Potassium 4.8; Sodium 133  Personally reviewed   Wt Readings from Last 3 Encounters:  03/02/21 53.5 kg (118 lb)  02/21/21 54 kg (119 lb)  02/10/21 57.9 kg (127 lb 10.3  oz)      ASSESSMENT AND PLAN:  1.  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  Unable to tolerate IV epoprostenol, Tyvaso or selexipag.  She remains on Revatio and macitentan. -  Stable NYHA III n macitentan 10 and revatio 60 tid -  RHC 4/18 as above -has now failed Selexipag as well as uptitration of her other PAH meds. - Echo 12/18. Mild RV strain. Unable to estimate RVSP due to lack of significant TR  - Echo 01/16/2019 LVEF 60-65%, PA peak pressure 58 mm Hg. RV looks normal (much improved) - Echo 3/21: LVEF 60-65% RV mildly dilated but function normal  - Echo 2/22: LVEF 55% RV severely dilated and HK. RVSP 159mHG - Volume status seems stable on torsemide 40 bid and spiro.  - We discussed her echo and the fact that pressures seem to be progressively elevated. We discussed repeat RHC and trial of selexipeg but she would like to defer for now. Prefers watchful waiting with repeat echo in a few months. If right-sided pressures still this high she would reconsider. Not interested in IV therapies currently. 2. Scleroderma:  - Advanced. She is followed by Dr. BAmil Amen - No  changes 3. PAD with LE wounds: - Follows with Dr. EDonnetta Hutchingin  V[facility]. Continue Plavix - Recent RLE I&D. [facility]ues [facility] follow [facility] 4. PAF - diagnosed during admission 2/22 - converted spontaneously.  - [facility]  5. Interstitial lung disease:  - Secondary to scleroderma.  - Stable on chronic O2.  - Followed by Dr. BLamonte Sakai Recent chest CT reviewed and is stable 6. HTN - BP ok 7. RLE DVT dx'sd11/21 - [facility] to previous bleeding  - VQ negative - IVC filter in place 8. HL - worried about myalgias with atorva -> now off - Consider switch to Crestor 5 MWF   Signed, DGlori Bickers MD  03/02/2021 3:23 PM  Advanced Heart Failure [facility]ona[address]Heart and [id number]([phone](office) ([phone](fax)

## 2021-03-02 NOTE — Patient Instructions (Signed)
Nice to see you today Your physician has requested that you have an echocardiogram. Echocardiography is a painless test that uses sound waves to create images of your heart. It provides your doctor with information about the size and shape of your heart and how well your heart's chambers and valves are working. This procedure takes approximately one hour. There are no restrictions for this procedure.  Your physician recommends that you schedule a follow-up appointment in: 2-3 months with a echocardiogram  If you have any questions or concerns before your next appointment please send [place] a message through [place] or call our office at [phone].    TO LEAVE A MESSAGE FOR THE NURSE SELECT OPTION 2, PLEASE LEAVE A MESSAGE INCLUDING: . YOUR NAME . DATE OF BIRTH . CALL BACK NUMBER . REASON FOR CALL**this is important as we prioritize the call backs  [place] AS LONG AS YOU CALL BEFORE 4:00 PM  At the [facility], you and your health needs are our priority. As part of our continuing mission to provide you with exceptional heart care, we have created designated Provider Care Teams. These Care Teams include your primary Cardiologist (physician) and Advanced Practice Providers (APPs- Physician Assistants and Nurse Practitioners) who all work together to provide you with the care you need, when you need it.   You may see any of the following providers on your designated Care Team at your next follow up: Marland Kitchen Dr Glori Bickers . Dr Loralie Champagne . Dr Vickki Muff . Darrick Grinder, NP . Lyda Jester, [place] . Audry Riles, PharmD   Please be sure to bring in all your medications bottles to every appointment.

## 2021-03-02 NOTE — Progress Notes (Signed)
[place] [phone]   [phone]  PROGRESS NOTE  Patient Care Team: [name], MD as PCP - General (Internal Medicine) Bensimhon, Shaune Pascal, MD as PCP - Cardiology (Cardiology) [name], MD (Inactive) as Consulting Physician (Gastroenterology) Bensimhon, Shaune Pascal, MD as Consulting Physician (Cardiology) Collene Gobble, MD as Consulting Physician (Pulmonary Disease) Hennie Duos, MD as Consulting Physician (Rheumatology) Marchia Bond, MD as Consulting Physician (Orthopedic Surgery)  Hematological/Oncological History #Normocytic Anemia #Thrombocytosis 07/22/2017: WBC 7.5 , Hgb 12.0, MCV 100.4, Plt 323 12/02/2018: WBC 5.2, Hgb 10.1, MCV 98, Plt 348 11/18/2019: WBC 7.4, Hgb 9.6, MCV 100, Plt 331 01/10/2021: WBC 9.3, Hgb 8.6, MCV 95.1, Plt 473 01/24/2021: WBC 12.4, Hgb 9.2, MCV 94.4, Plt 461 01/26/2021: establish care with Dr. Lorenso Courier  02/01/2021: Feraheme 55m IV x 1 dose. 2nd dose missed due to hospitalization.  Interval History:  [name] ZERVAS70y.o. female with medical history significant for iron deficiency anemia/anemia of inflammation who presents for a follow up visit. The patient's last visit was on 01/26/2021 at which time she established care. In the interim since the last visit Mrs. BPetraliahad one dose of IV Feraheme but suddenly missed the second dose as she was admitted to the hospital due to acute on chronic renal failure.  On exam today Ms. BWinquistreports that she is improved from when she was in the hospital.  She reports that she was having issues with delirium while in house but it improved with hydration and a decrease in her creatinine back to baseline.  She reports that she tolerated the first dose of IV Feraheme well without any concerning symptoms.  She also notes that she did not have any increase in energy or any improvement in her breathing with a single dose of IV Feraheme.  Improvement in her hemoglobin was  modest with an increase up to 10.1 up from 9.3 will establish care with her about 6 weeks ealier.   She currently denies any fevers, chills, sweats, nausea, vomiting or diarrhea.  She is otherwise at her baseline level of health.  A full ten point ROS is listed below.  MEDICAL HISTORY:  Past Medical History:  Diagnosis Date  . Anemia   . Arthritis   . CHF (congestive heart failure) ([place]   . DOE (dyspnea on exertion)   . Epistaxis   . History of chicken pox   . Hypertension   . Hypothyroidism   . Leg ulcer ([place]   . Melanoma ([place] 1999  . 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[facility]   . Non Hodgkin's lymphoma ([facility]    Treated with chemo 2010, felt to be cured.  .Marland KitchenPAH (pulmonary artery hypertension) ([facility]   . Peripheral arterial occlusive disease ([place]   . Pneumonia 2008  . Pulmonary hypertension ([facility]   . Requires supplemental oxygen    2 liters-3liters when moving  . Sclerodermia generalized ([place]     SURGICAL HISTORY: Past Surgical History:  Procedure Laterality Date  . ABDOMINAL AORTOGRAM W/LOWER EXTREMITY Bilateral 10/04/2020   Procedure: ABDOMINAL AORTOGRAM W/LOWER EXTREMITY;  Surgeon: BSerafina Mitchell MD;  [facility];  Service: Cardiovascular;  Laterality: Bilateral;  . ABDOMINAL AORTOGRAM W/LOWER EXTREMITY Bilateral 01/02/2021   Procedure: ABDOMINAL AORTOGRAM W/LOWER EXTREMITY;  Surgeon: CWaynetta Sandy MD;  [facility];  Service: Cardiovascular;  Laterality: Bilateral;  . AMPUTATION Right 10/24/2017   Procedure: RAY resection right index;  Surgeon: KDaryll Brod MD;  [place]  Service: Orthopedics;  Laterality: Right;  Axillary block  . AMPUTATION Right 11/21/2018   Procedure: AMPUTATION RAY RIGHT MIDDLE FINGER;  Surgeon: Daryll Brod, MD;  Location: [place];  Service: Orthopedics;  Laterality: Right;  . AMPUTATION Right 10/22/2019   Procedure: AMPUTATION DIGIT RIGHT RING AND SMALL FINGER, DIGITAL SYMPATHECTOMY RIGHT  RADIAL ULNAR ARCH;  Surgeon: Daryll Brod, MD;  Location: [place];  Service: Orthopedics;  Laterality: Right;  AXILARY  . apligraph  12-04-2102,   12-18-2012   bilateral calves   [name]  . APPLICATION OF A-CELL OF EXTREMITY Right 02/09/2021   Procedure: application of primatrix mesh;  Surgeon: Cindra Presume, MD;  Location: [place];  Service: Plastics;  Laterality: Right;  . biopsy on cerebellum    . COLONOSCOPY WITH PROPOFOL Left 10/07/2015   Procedure: COLONOSCOPY WITH PROPOFOL;  Surgeon: [name], MD;  Location: [place];  Service: Endoscopy;  Laterality: Left;  . ENDARTERECTOMY FEMORAL Right 09/14/2016   Procedure: ENDARTERECTOMY FEMORAL RIGHT;  Surgeon: [name], MD;  Location: [facility] OR;  Service: Vascular;  Laterality: Right;  . ENDOVENOUS ABLATION SAPHENOUS VEIN W/ LASER  07-24-2012   right greater saphenous vein by [name], M.D.   . ENDOVENOUS ABLATION SAPHENOUS VEIN W/ LASER  10-16-2012   left greater saphenous vein  by Dr. [name]  . ENDOVENOUS ABLATION SAPHENOUS VEIN W/ LASER Right 09-01-2015   endovenous laser ablation right greater saphenous vein by [name] MD  . ESOPHAGOGASTRODUODENOSCOPY N/A 05/07/2016   Procedure: ESOPHAGOGASTRODUODENOSCOPY (EGD);  Surgeon: [name], MD;  Location: [facility] ENDOSCOPY;  Service: Endoscopy;  Laterality: N/A;  . ESOPHAGOGASTRODUODENOSCOPY N/A 11/23/2020   Procedure: ESOPHAGOGASTRODUODENOSCOPY (EGD);  Surgeon: [name], MD;  Location: [facility];  Service: Endoscopy;  Laterality: N/A;  . ESOPHAGOGASTRODUODENOSCOPY (EGD) WITH PROPOFOL Left 10/07/2015   Procedure: ESOPHAGOGASTRODUODENOSCOPY (EGD) WITH PROPOFOL;  Surgeon: [name], MD;  Location: [place];  Service: Endoscopy;  Laterality: Left;  . GIVENS CAPSULE STUDY N/A 07/31/2016   Procedure: GIVENS CAPSULE STUDY;  Surgeon: [name], MD;  Location: [facility] ENDOSCOPY;  Service: Endoscopy;  Laterality: N/A;  . GIVENS CAPSULE STUDY N/A 11/23/2020   Procedure:  GIVENS CAPSULE STUDY;  Surgeon: [name], MD;  Location: [facility] ENDOSCOPY;  Service: Endoscopy;  Laterality: N/A;  . I & D EXTREMITY Right 02/09/2021   Procedure: Debridement of right lower extremity wound;  Surgeon: Cindra Presume, MD;  Location: [place];  Service: Plastics;  Laterality: Right;  1 hour, please  . IR IVC FILTER PLMT / S&I /IMG GUID/MOD SED  11/22/2020  . LOWER EXTREMITY ANGIOGRAPHY N/A 10/25/2020   Procedure: LOWER EXTREMITY ANGIOGRAPHY - Left;  Surgeon: Serafina Mitchell, MD;  Location: [place] [facility];  Service: Cardiovascular;  Laterality: N/A;  . melonoma removes Right    behind right knee  . OPEN REDUCTION INTERNAL FIXATION (ORIF) DISTAL RADIAL FRACTURE Left 11/24/2019   Procedure: OPEN REDUCTION INTERNAL FIXATION (ORIF) DISTAL RADIAL FRACTURE;  Surgeon: Altamese L[place], MD;  Location: [place];  Service: Orthopedics;  Laterality: Left;  . PATCH ANGIOPLASTY Right 09/14/2016   Procedure: PATCH ANGIOPLASTY RIGHT FEMORAL ARTERY USING HEMASHIELD PLATINUM FINESSE PATCH;  Surgeon: [name], MD;  Location: [place];  Service: Vascular;  Laterality: Right;  . PERIPHERAL VASCULAR BALLOON ANGIOPLASTY Right 10/04/2020   Procedure: PERIPHERAL VASCULAR BALLOON ANGIOPLASTY;  Surgeon: Serafina Mitchell, MD;  Location: [place] [facility];  Service: Cardiovascular;  Laterality: Right;  SFA/PT/AT/PTtrunk  . PERIPHERAL VASCULAR BALLOON ANGIOPLASTY  Left 10/25/2020   Procedure: PERIPHERAL VASCULAR BALLOON ANGIOPLASTY;  Surgeon: Serafina Mitchell, MD;  Location: [place] [facility];  Service: Cardiovascular;  Laterality: Left;  anterior tibial and tibioperoneal trunk  . PERIPHERAL VASCULAR CATHETERIZATION N/A 09/07/2016   Procedure: Abdominal Aortogram w/Lower Extremity;  Surgeon: [name], MD;  Location: [place] [facility];  Service: Cardiovascular;  Laterality: N/A;  . RIGHT HEART CATH N/A 04/19/2017   Procedure: Right Heart Cath;  Surgeon: [name], MD;  Location: [place] [facility];   Service: Cardiovascular;  Laterality: N/A;  . RIGHT HEART CATHETERIZATION N/A 11/14/2012   Procedure: RIGHT HEART CATH;  Surgeon: [name], MD;  Location: [facility] CATH LAB;  Service: Cardiovascular;  Laterality: N/A;  . rt little toe removal  10/1998  . stent left thigh  3/11  . TENDON TRANSFER Right 10/24/2017   Procedure: transfer extensor indecus propius/extensor digitorum commomus index to middle ring and small;  Surgeon: Daryll Brod, MD;  Location: [place];  Service: Orthopedics;  Laterality: Right;  . TONSILLECTOMY    . TONSILLECTOMY AND ADENOIDECTOMY    . TUNNELED VENOUS CATHETER PLACEMENT  01/2013  . ULNAR HEAD EXCISION Right 10/24/2017   Procedure: darrach right ulna;  Surgeon: Daryll Brod, MD;  Location: [place];  Service: Orthopedics;  Laterality: Right;  . VEIN LIGATION AND STRIPPING  05/16/2012   Procedure: VEIN LIGATION AND STRIPPING;  Surgeon: [name], MD;  Location: [facility] OR;  Service: Vascular;  Laterality: Right;  Irrigation and Debridement right lower leg, ligation of saphenous vein.    SOCIAL HISTORY: Social History   Socioeconomic History  . Marital status: Married    Spouse name: Not on file  . Number of children: 1  . Years of education: Not on file  . Highest education level: Not on file  Occupational History  . Occupation: homemaker  Tobacco Use  . Smoking status: Never Smoker  . Smokeless tobacco: Never Used  Vaping Use  . Vaping Use: Never used  Substance and Sexual Activity  . Alcohol use: Yes    Alcohol/week: 7.0 standard drinks    Types: 3 Standard drinks or equivalent, 4 Glasses of wine per week  . Drug use: No  . Sexual activity: Not Currently  Other Topics Concern  . Not on file  Social History Narrative  . Not on file   Social Determinants of Health   Financial Resource Strain: Not on file  Food Insecurity: Not on file  Transportation Needs: Not on file  Physical Activity: Not on file  Stress: Not on file  Social Connections: Not on  file  Intimate Partner Violence: Not on file    FAMILY HISTORY: Family History  Problem Relation Age of Onset  . Lupus Father   . Lymphoma Father   . Hyperlipidemia Mother   . Cancer Daughter        sarcoma  . Colon cancer Other     ALLERGIES:  is allergic to levofloxacin, sulfa antibiotics, doxycycline hyclate, and penicillins.  MEDICATIONS:  Current Outpatient Medications  Medication Sig Dispense Refill  . acetaminophen (TYLENOL) 650 MG CR tablet Take 1,300 mg by mouth every 8 (eight) hours as needed for pain.    . Biotin 5000 MCG CAPS Take 5,000 mcg by mouth daily.    . Calcium Carbonate-Vit D-Min (CALCIUM 1200 PO) Take 1 tablet by mouth at bedtime.    . Cholecalciferol (VITAMIN D) 2000 units CAPS Take 2,000 Units by mouth at bedtime.    Marland Kitchen  citalopram (CELEXA) 20 MG tablet Take 1 tablet (20 mg total) by mouth at bedtime. 30 tablet 0  . clopidogrel (PLAVIX) 75 MG tablet TAKE 1 TABLET BY MOUTH EVERY DAY 90 tablet 3  . cycloSPORINE (RESTASIS) 0.05 % ophthalmic emulsion Place 2 drops into both eyes 2 (two) times daily.    Marland Kitchen diltiazem (CARDIZEM CD) 180 MG 24 hr capsule Take 1 capsule (180 mg total) by mouth daily. 30 capsule 3  . Ensure Max Protein (ENSURE MAX PROTEIN) LIQD Take 330 mLs (11 oz total) by mouth at bedtime. 330 mL 0  . FERREX 150 150 MG capsule TAKE 1 CAPSULE BY MOUTH TWICE A DAY 180 capsule 1  . fish oil-omega-3 fatty acids 1000 MG capsule Take 1,000 mg by mouth 2 (two) times daily.    . folic acid (FOLVITE) 1 MG tablet TAKE 1 TABLET BY MOUTH EVERY DAY 90 tablet 1  . gabapentin (NEURONTIN) 300 MG capsule Take 300 mg by mouth daily.    Marland Kitchen HYDROcodone-acetaminophen (NORCO) 5-325 MG tablet Take 1 tablet by mouth as needed for severe pain. (Do not exceed 1 tablet every 8 hours) 15 tablet 0  . levothyroxine (SYNTHROID) 50 MCG tablet TAKE 1 TABLET BY MOUTH DAILY BEFORE BREAKFAST 90 tablet 1  . loperamide (IMODIUM) 2 MG capsule Take 2-4 mg by mouth 4 (four) times daily as  needed for diarrhea or loose stools.    Marland Kitchen MAGNESIUM ASPARTATE PO Take 150 mg by mouth in the morning and at bedtime.    . Multiple Vitamin (MULTIVITAMIN WITH MINERALS) TABS tablet Take 1 tablet by mouth daily at 12 noon.    . nutrition supplement, JUVEN, (JUVEN) PACK Take 1 packet by mouth 2 (two) times daily between meals. 30 packet 0  . ondansetron (ZOFRAN) 4 MG tablet TAKE 1 TABLET BY MOUTH EVERY 8 HOURS AS NEEDED FOR NAUSEA AND VOMITING 20 tablet 0  . OPSUMIT 10 MG tablet Take 1 tablet (55m) by mouth daily. 30 tablet 11  . OXYGEN Inhale 2-3 L into the lungs continuous.     . pantoprazole (PROTONIX) 40 MG tablet Take 1 tablet (40 mg total) by mouth 2 (two) times daily. 180 tablet 3  . potassium chloride 20 MEQ/15ML (10%) SOLN TAKE 2 TEASPOONFUL (DILUTED IN WATER) ONCE DAILY 473 mL 11  . predniSONE (DELTASONE) 5 MG tablet Take 5 mg by mouth daily.    . rosuvastatin (CRESTOR) 5 MG tablet Take 1 tablet every Monday, Wednesday, and Friday. 45 tablet 3  . sildenafil (REVATIO) 20 MG tablet Take 3 tablets (60 mg total) by mouth 3 (three) times daily. 810 tablet 3  . torsemide (DEMADEX) 20 MG tablet TAKE 2 TABLETS BY MOUTH IN THE AM AND 1 TABLET BY MOUTH IN THE PM. 270 tablet 3  . valACYclovir (VALTREX) 500 MG tablet Take 500 mg by mouth as needed.    . vitamin B-12 (CYANOCOBALAMIN) 1000 MCG tablet Take 1,000 mcg by mouth daily.     No current facility-administered medications for this visit.    REVIEW OF SYSTEMS:   Constitutional: ( - ) fevers, ( - )  chills , ( - ) night sweats Eyes: ( - ) blurriness of vision, ( - ) double vision, ( - ) watery eyes Ears, nose, mouth, throat, and face: ( - ) mucositis, ( - ) sore throat Respiratory: ( - ) cough, ( - ) dyspnea, ( - ) wheezes Cardiovascular: ( - ) palpitation, ( - ) chest discomfort, ( - ) lower  extremity swelling Gastrointestinal:  ( - ) nausea, ( - ) heartburn, ( - ) change in bowel habits Skin: ( - ) abnormal skin rashes Lymphatics: ( - )  new lymphadenopathy, ( - ) easy bruising Neurological: ( - ) numbness, ( - ) tingling, ( - ) new weaknesses Behavioral/Psych: ( - ) mood change, ( - ) new changes  All other systems were reviewed with the patient and are negative.  PHYSICAL EXAMINATION:  Vitals:   03/03/21 1555  BP: (!) 127/52  Pulse: 70  Resp: 16  Temp: (!) 97.4 F (36.3 C)  SpO2: 99%   Filed Weights    GENERAL: chronically ill appearing elderly Caucasian female. alert, no distress and comfortable SKIN: skin color, texture, turgor are normal, no rashes or significant lesions EYES: conjunctiva are pink and non-injected, sclera clear LUNGS: clear to auscultation and percussion with normal breathing effort HEART: regular rate & rhythm and no murmurs and no lower extremity edema Musculoskeletal: no cyanosis of digits and no clubbing. Fingers missing on right hand to dry gangrene PSYCH: alert & oriented x 3, fluent speech NEURO: no focal motor/sensory deficits  LABORATORY DATA:  I have reviewed the data as listed CBC Latest Ref Rng & Units 03/03/2021 02/20/2021 02/10/2021  WBC 4.0 - 10.5 K/uL 10.6(H) 12.5(H) 8.7  Hemoglobin 12.0 - 15.0 g/dL 10.1(L) 9.9(L) 8.7(L)  Hematocrit 36.0 - 46.0 % 32.0(L) 30.4(L) 28.9(L)  Platelets 150 - 400 K/uL 386 402(H) 246    CMP Latest Ref Rng & Units 03/03/2021 02/20/2021 02/10/2021  Glucose 70 - 99 mg/dL 110(H) 110(H) 96  BUN 8 - 23 mg/dL 41(H) 31(H) 27(H)  Creatinine 0.44 - 1.00 mg/dL 1.31(H) 1.19(H) 1.61(H)  Sodium 135 - 145 mmol/L 132(L) 133(L) 133(L)  Potassium 3.5 - 5.1 mmol/L 5.3(H) 4.8 4.0  Chloride 98 - 111 mmol/L 96(L) 90(L) 102  CO2 22 - 32 mmol/L 26 31 20(L)  Calcium 8.9 - 10.3 mg/dL 9.1 8.6 6.9(L)  Total Protein 6.5 - 8.1 g/dL 7.2 6.3 -  Total Bilirubin 0.3 - 1.2 mg/dL 0.2(L) 0.3 -  Alkaline Phos 38 - 126 U/L 90 - -  AST 15 - 41 U/L 15 13 -  ALT 0 - 44 U/L 11 12 -    RADIOGRAPHIC STUDIES:  MR BRAIN WO CONTRAST  Result Date: 02/07/2021 CLINICAL DATA:  Weakness.   Mental status change. EXAM: MRI HEAD WITHOUT CONTRAST TECHNIQUE: Multiplanar, multiecho pulse sequences of the brain and surrounding structures were obtained without intravenous contrast. COMPARISON:  CT February 05, 2021. FINDINGS: Motion limited exam. Brain: No acute infarction, acute hemorrhage, hydrocephalus, extra-axial collection or mass lesion. Moderate diffuse cerebral atrophy with ex vacuo ventricular dilation. Moderate patchy T2/FLAIR hyperintensities within the white matter and pons, likely the sequela of chronic microvascular ischemic disease. 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 Remote right sellar bell ir lacunar infarct. Surrounding gliosis. Vascular: Major arterial flow voids are maintained at the skull base. Skull and upper cervical spine: Normal marrow signal. Sinuses/Orbits: Sinuses are largely clear.  Unremarkable orbits. Other: Left mastoid effusion. IMPRESSION: 1. No evidence of acute intracranial abnormality on this motion limited exam. 2. 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 3. Moderate chronic microvascular ischemic disease and generalized cerebral atrophy. 4. Left mastoid effusion. Electronically Signed   By: Margaretha Sheffield MD   On: 02/07/2021 10:45   DG CHEST PORT 1 VIEW  Result Date: 02/08/2021 CLINICAL DATA:  Tachycardia. EXAM: PORTABLE CHEST 1 VIEW COMPARISON:  02/07/2021 FINDINGS: 0517 hours. The cardio pericardial silhouette is enlarged. Interstitial markings are diffusely coarsened with chronic features. No dense focal airspace consolidation. No pneumothorax or pleural effusion. Right PICC line again noted. Telemetry leads overlie the chest. IMPRESSION: Stable.  Cardiomegaly with chronic interstitial lung disease. Electronically Signed   By: Misty Stanley M.D.   On: 02/08/2021 05:56   DG Chest Port 1 View  Result Date: 02/07/2021 CLINICAL DATA:  Weakness.  Central catheter placement EXAM: PORTABLE CHEST 1 VIEW COMPARISON:   November 21, 2020 FINDINGS: Central catheter tip is at the cavoatrial junction. No pneumothorax. There is interstitial thickening without frank edema or consolidation. Heart is mildly enlarged with pulmonary vascularity normal. No adenopathy. There is aortic atherosclerosis. No bone lesions. IMPRESSION: Central catheter tip at cavoatrial junction. No pneumothorax. Central interstitial thickening, likely indicative of a degree of chronic bronchitis. No edema or airspace opacity. Mild cardiomegaly. Aortic Atherosclerosis (ICD10-I70.0). Electronically Signed   By: Lowella Grip III M.D.   On: 02/07/2021 09:25   ECHOCARDIOGRAM COMPLETE  Result Date: 02/08/2021    ECHOCARDIOGRAM REPORT   Patient Name:   KINSLIE HOVE Date of Exam: 02/08/2021 Medical Rec #:  [id number]      Height:       68.0 in Accession #:    [id number]     Weight:       122.6 lb Date of Birth:  1951-04-15      BSA:          1.660 m Patient Age:    70 years       BP:           141/75 mmHg Patient Gender: F              HR:           82 bpm. Exam Location:  Inpatient Procedure: 2D Echo, Color Doppler and Cardiac Doppler Indications:    I48.91* Unspecified atrial fibrillation  History:        Patient has prior history of Echocardiogram examinations, most                 recent 03/17/2020. CHF, Pulmonary HTN; Risk Factors:Hypertension                 and Dyslipidemia.  Sonographer:    Raquel Sarna Senior RDCS Referring Phys: [id number] Shela Leff  Sonographer Comments: Difficult apical window, patient unable to reposition. IMPRESSIONS  1. Abnormal septal motion . Left ventricular ejection fraction, by estimation, is 55%. The left ventricle has normal function. The left ventricle has no regional wall motion abnormalities. There is mild left ventricular hypertrophy. Left ventricular diastolic parameters were normal.  2. Right ventricular systolic function is severely reduced. The right ventricular size is severely enlarged. There is severely elevated  pulmonary artery systolic pressure.  3. Left atrial size was severely dilated.  4. The mitral valve is normal in structure. Mild mitral valve regurgitation. No evidence of mitral stenosis.  5. Severe pulmonary hypertension and cor pulmonale. Tricuspid valve regurgitation is moderate to severe.  6. The aortic valve is tricuspid. Aortic valve regurgitation is not visualized. Mild to moderate aortic valve sclerosis/calcification is present, without any evidence of aortic stenosis.  7. The inferior vena cava is dilated in size with >50% respiratory variability, suggesting right atrial pressure of 8 mmHg. FINDINGS  Left Ventricle: Abnormal septal motion. Left ventricular ejection fraction, by estimation, is 55%. The left ventricle has normal function. The left ventricle has no regional wall motion  abnormalities. The left ventricular internal cavity size was normal  in size. There is mild left ventricular hypertrophy. Left ventricular diastolic parameters were normal. Right Ventricle: The right ventricular size is severely enlarged. Right vetricular wall thickness was not assessed. Right ventricular systolic function is severely reduced. There is severely elevated pulmonary artery systolic pressure. The tricuspid regurgitant velocity is 4.95 m/s, and with an assumed right atrial pressure of 15 mmHg, the estimated right ventricular systolic pressure is 774.1 mmHg. Left Atrium: Left atrial size was severely dilated. Right Atrium: Right atrial size was normal in size. Pericardium: There is no evidence of pericardial effusion. Mitral Valve: The mitral valve is normal in structure. There is mild thickening of the mitral valve leaflet(s). Mild mitral valve regurgitation. No evidence of mitral valve stenosis. Tricuspid Valve: Severe pulmonary hypertension and cor pulmonale. The tricuspid valve is normal in structure. Tricuspid valve regurgitation is moderate to severe. No evidence of tricuspid stenosis. Aortic Valve: The aortic  valve is tricuspid. Aortic valve regurgitation is not visualized. Mild to moderate aortic valve sclerosis/calcification is present, without any evidence of aortic stenosis. Pulmonic Valve: The pulmonic valve was normal in structure. Pulmonic valve regurgitation is mild. No evidence of pulmonic stenosis. Aorta: The aortic root is normal in size and structure. Venous: The inferior vena cava is dilated in size with greater than 50% respiratory variability, suggesting right atrial pressure of 8 mmHg. IAS/Shunts: No atrial level shunt detected by color flow Doppler.  LEFT VENTRICLE PLAX 2D LVIDd:         3.80 cm LVIDs:         2.30 cm LV PW:         1.30 cm LV IVS:        1.20 cm LVOT diam:     2.00 cm LV SV:         63 LV SV Index:   38 LVOT Area:     3.14 cm  RIGHT VENTRICLE RV S prime:     8.05 cm/s TAPSE (M-mode): 1.8 cm LEFT ATRIUM             Index       RIGHT ATRIUM           Index LA diam:        3.60 cm 2.17 cm/m  RA Area:     26.40 cm LA Vol (A2C):   39.3 ml 23.68 ml/m RA Volume:   102.00 ml 61.45 ml/m LA Vol (A4C):   55.5 ml 33.44 ml/m LA Biplane Vol: 51.1 ml 30.79 ml/m  AORTIC VALVE LVOT Vmax:   113.00 cm/s LVOT Vmean:  74.500 cm/s LVOT VTI:    0.200 m  AORTA Ao Root diam: 3.60 cm Ao Asc diam:  3.40 cm TRICUSPID VALVE TR Peak grad:   98.0 mmHg TR Vmax:        495.00 cm/s  SHUNTS Systemic VTI:  0.20 m Systemic Diam: 2.00 cm Jenkins Rouge MD Electronically signed by Jenkins Rouge MD Signature Date/Time: 02/08/2021/11:54:03 AM    Final     ASSESSMENT & PLAN [name] [name] 70 y.o. female with medical history significant for iron deficiency anemia/anemia of inflammation who presents for a follow up visit.  On review the labs today Ms. Madonia did have a modest bump in her hemoglobin up to 10.1 and her ferritin stored up to 786, however her iron saturation is approximately the same at ten she continues to have a low total iron binding capacity at 198.  I  do believe that the main cause of her low hemoglobin  is anemia of chronic inflammation.  There is not appear to be a great benefit from the IV iron therapy, however given her continually low iron saturation rate if she felt it would benefit we could offer her additional doses of IV Feraheme.  In the interim we would recommend treating the underlying inflammatory condition in order to get under better control so the body is able to better absorb iron and produce red blood cells.  Ms. Ahuja voiced her understanding of this plan moving forward.   #Anemia of Chronic Disease/Iron Deficiency Anemia #Normocytic Anemia #Thrombocytosis, resolved --findings are concerning for anemia of inflammation coupled with iron deficiency anemia  --patient received 1 dose of IV feraheme 518m without much benefit --repeat iron studies today --if iron levels remain low will offer the patient repeat IV iron infusions, though in the setting of such marked inflammation her ability to utilize this iron for hematopoesis is severely compromised. --RTC in 3 months time or sooner if additional IV iron is desired.   No orders of the defined types were placed in this encounter.   All questions were answered. The patient knows to call the clinic with any problems, questions or concerns.  A total of more than 30 minutes were spent on this encounter and over half of that time was spent on counseling and coordination of care as outlined above.   JLedell Peoples MD Department of Hematology/Oncology [facility]at [facility] LLCPhone: [phone]: [phone]: [email or web address]  03/07/2021 7:16 PM

## 2021-03-03 ENCOUNTER — Other Ambulatory Visit: Payer: Self-pay

## 2021-03-03 ENCOUNTER — Inpatient Hospital Stay: Payer: Medicare Other | Attending: Hematology and Oncology | Admitting: Hematology and Oncology

## 2021-03-03 ENCOUNTER — Encounter: Payer: Self-pay | Admitting: Hematology and Oncology

## 2021-03-03 ENCOUNTER — Inpatient Hospital Stay: Payer: Medicare Other

## 2021-03-03 ENCOUNTER — Telehealth: Payer: Self-pay | Admitting: Internal Medicine

## 2021-03-03 VITALS — BP 127/52 | HR 70 | Temp 97.4°F | Resp 16

## 2021-03-03 DIAGNOSIS — H906 Mixed conductive and sensorineural hearing loss, bilateral: Secondary | ICD-10-CM | POA: Diagnosis not present

## 2021-03-03 DIAGNOSIS — D500 Iron deficiency anemia secondary to blood loss (chronic): Secondary | ICD-10-CM

## 2021-03-03 DIAGNOSIS — J9610 Chronic respiratory failure, unspecified whether with hypoxia or hypercapnia: Secondary | ICD-10-CM | POA: Diagnosis not present

## 2021-03-03 DIAGNOSIS — L97819 Non-pressure chronic ulcer of other part of right lower leg with unspecified severity: Secondary | ICD-10-CM | POA: Diagnosis not present

## 2021-03-03 DIAGNOSIS — M810 Age-related osteoporosis without current pathological fracture: Secondary | ICD-10-CM | POA: Diagnosis not present

## 2021-03-03 DIAGNOSIS — I8501 Esophageal varices with bleeding: Secondary | ICD-10-CM | POA: Diagnosis not present

## 2021-03-03 DIAGNOSIS — D509 Iron deficiency anemia, unspecified: Secondary | ICD-10-CM | POA: Insufficient documentation

## 2021-03-03 DIAGNOSIS — I2721 Secondary pulmonary arterial hypertension: Secondary | ICD-10-CM | POA: Diagnosis not present

## 2021-03-03 DIAGNOSIS — I50810 Right heart failure, unspecified: Secondary | ICD-10-CM | POA: Diagnosis not present

## 2021-03-03 DIAGNOSIS — K2100 Gastro-esophageal reflux disease with esophagitis, without bleeding: Secondary | ICD-10-CM | POA: Diagnosis not present

## 2021-03-03 DIAGNOSIS — I70209 Unspecified atherosclerosis of native arteries of extremities, unspecified extremity: Secondary | ICD-10-CM | POA: Diagnosis not present

## 2021-03-03 DIAGNOSIS — M069 Rheumatoid arthritis, unspecified: Secondary | ICD-10-CM | POA: Diagnosis not present

## 2021-03-03 DIAGNOSIS — N1831 Chronic kidney disease, stage 3a: Secondary | ICD-10-CM | POA: Diagnosis not present

## 2021-03-03 DIAGNOSIS — J8489 Other specified interstitial pulmonary diseases: Secondary | ICD-10-CM | POA: Diagnosis not present

## 2021-03-03 DIAGNOSIS — I87331 Chronic venous hypertension (idiopathic) with ulcer and inflammation of right lower extremity: Secondary | ICD-10-CM | POA: Diagnosis not present

## 2021-03-03 DIAGNOSIS — E440 Moderate protein-calorie malnutrition: Secondary | ICD-10-CM | POA: Diagnosis not present

## 2021-03-03 DIAGNOSIS — F419 Anxiety disorder, unspecified: Secondary | ICD-10-CM | POA: Diagnosis not present

## 2021-03-03 DIAGNOSIS — I130 Hypertensive heart and chronic kidney disease with heart failure and stage 1 through stage 4 chronic kidney disease, or unspecified chronic kidney disease: Secondary | ICD-10-CM | POA: Diagnosis not present

## 2021-03-03 DIAGNOSIS — E039 Hypothyroidism, unspecified: Secondary | ICD-10-CM | POA: Diagnosis not present

## 2021-03-03 DIAGNOSIS — H6993 Unspecified Eustachian tube disorder, bilateral: Secondary | ICD-10-CM | POA: Diagnosis not present

## 2021-03-03 DIAGNOSIS — M341 CR(E)ST syndrome: Secondary | ICD-10-CM | POA: Diagnosis not present

## 2021-03-03 DIAGNOSIS — M3481 Systemic sclerosis with lung involvement: Secondary | ICD-10-CM | POA: Diagnosis not present

## 2021-03-03 DIAGNOSIS — E782 Mixed hyperlipidemia: Secondary | ICD-10-CM | POA: Diagnosis not present

## 2021-03-03 DIAGNOSIS — I82401 Acute embolism and thrombosis of unspecified deep veins of right lower extremity: Secondary | ICD-10-CM | POA: Diagnosis not present

## 2021-03-03 DIAGNOSIS — K2940 Chronic atrophic gastritis without bleeding: Secondary | ICD-10-CM | POA: Diagnosis not present

## 2021-03-03 LAB — RETIC PANEL
Immature Retic Fract: 15.3 % (ref 2.3–15.9)
RBC.: 3.35 MIL/uL — ABNORMAL LOW (ref 3.87–5.11)
Retic Count, Absolute: 44.2 10*3/uL (ref 19.0–186.0)
Retic Ct Pct: 1.3 % (ref 0.4–3.1)
Reticulocyte Hemoglobin: 32.9 pg (ref >27.9)

## 2021-03-03 LAB — CBC WITH DIFFERENTIAL (CANCER CENTER ONLY)
Abs Immature Granulocytes: 0.18 10*3/uL — ABNORMAL HIGH (ref 0.00–0.07)
Basophils Absolute: 0 10*3/uL (ref 0.0–0.1)
Basophils Relative: 0 %
Eosinophils Absolute: 0 10*3/uL (ref 0.0–0.5)
Eosinophils Relative: 0 %
HCT: 32 % — ABNORMAL LOW (ref 36.0–46.0)
Hemoglobin: 10.1 g/dL — ABNORMAL LOW (ref 12.0–15.0)
Immature Granulocytes: 2 %
Lymphocytes Relative: 3 %
Lymphs Abs: 0.4 10*3/uL — ABNORMAL LOW (ref 0.7–4.0)
MCH: 30.4 pg (ref 26.0–34.0)
MCHC: 31.6 g/dL (ref 30.0–36.0)
MCV: 96.4 fL (ref 80.0–100.0)
Monocytes Absolute: 0.5 10*3/uL (ref 0.1–1.0)
Monocytes Relative: 5 %
Neutro Abs: 9.6 10*3/uL — ABNORMAL HIGH (ref 1.7–7.7)
Neutrophils Relative %: 90 %
Platelet Count: 386 10*3/uL (ref 150–400)
RBC: 3.32 MIL/uL — ABNORMAL LOW (ref 3.87–5.11)
RDW: 17.2 % — ABNORMAL HIGH (ref 11.5–15.5)
WBC Count: 10.6 10*3/uL — ABNORMAL HIGH (ref 4.0–10.5)
nRBC: 0 % (ref 0.0–0.2)

## 2021-03-03 LAB — CMP (CANCER CENTER ONLY)
ALT: 11 U/L (ref 0–44)
AST: 15 U/L (ref 15–41)
Albumin: 2.4 g/dL — ABNORMAL LOW (ref 3.5–5.0)
Alkaline Phosphatase: 90 U/L (ref 38–126)
Anion gap: 10 (ref 5–15)
BUN: 41 mg/dL — ABNORMAL HIGH (ref 8–23)
CO2: 26 mmol/L (ref 22–32)
Calcium: 9.1 mg/dL (ref 8.9–10.3)
Chloride: 96 mmol/L — ABNORMAL LOW (ref 98–111)
Creatinine: 1.31 mg/dL — ABNORMAL HIGH (ref 0.44–1.00)
GFR, Estimated: 44 mL/min — ABNORMAL LOW (ref >60)
Glucose, Bld: 110 mg/dL — ABNORMAL HIGH (ref 70–99)
Potassium: 5.3 mmol/L — ABNORMAL HIGH (ref 3.5–5.1)
Sodium: 132 mmol/L — ABNORMAL LOW (ref 135–145)
Total Bilirubin: 0.2 mg/dL — ABNORMAL LOW (ref 0.3–1.2)
Total Protein: 7.2 g/dL (ref 6.5–8.1)

## 2021-03-03 NOTE — Telephone Encounter (Signed)
[place]  [facility]ariann Laster called to say they had received home health orders and wanted to see if you would be following and signing off on them for wound care and other home health services.

## 2021-03-03 NOTE — Telephone Encounter (Signed)
Check with patient to see if she wants home health.

## 2021-03-03 NOTE — Telephone Encounter (Signed)
Called patient they are aware of the [place] but they understand that the Surgeons office will be signing off on orders

## 2021-03-03 NOTE — Telephone Encounter (Signed)
When I called back to let Alvis Lemmings know that we would not be signing off on orders because we did not know what was going and that the PA from Plastic Surgery had order the [place]. The lady that answered phone said that she would let them know but normally they call PCP to sign off on orders. They will call back if they have any questions.

## 2021-03-03 NOTE — Telephone Encounter (Signed)
The plastic surgery PA ordered this and should sign off. I also understand that patient is to go back to wound care center in 2 weeks per plastic surgery notes.

## 2021-03-04 ENCOUNTER — Other Ambulatory Visit (HOSPITAL_COMMUNITY): Payer: Self-pay | Admitting: Internal Medicine

## 2021-03-04 ENCOUNTER — Other Ambulatory Visit: Payer: Self-pay | Admitting: Internal Medicine

## 2021-03-06 ENCOUNTER — Telehealth: Payer: Self-pay

## 2021-03-06 LAB — IRON AND TIBC
Iron: 21 ug/dL — ABNORMAL LOW (ref 41–142)
Saturation Ratios: 10 % — ABNORMAL LOW (ref 21–57)
TIBC: 198 ug/dL — ABNORMAL LOW (ref 236–444)
UIBC: 177 ug/dL (ref 120–384)

## 2021-03-06 LAB — FERRITIN: Ferritin: 786 ng/mL — ABNORMAL HIGH (ref 11–307)

## 2021-03-06 NOTE — Telephone Encounter (Signed)
Returned husbands call. Advised him I faxed (same fax number) the order to [facility] on 02/23/21 and received a confirmation that fax went through. Will fax again.

## 2021-03-06 NOTE — Telephone Encounter (Signed)
Patient's husband Ellah Otte) called to say that Roetta Sessions, PA-C had recommended [facility] change the patient's bandage two times per week but Alvis Lemmings does not have those orders.  Please call.  Juanda Crumble gave [place] the phone number for Alvis Lemmings [phone]*

## 2021-03-07 ENCOUNTER — Encounter: Payer: Self-pay | Admitting: Hematology and Oncology

## 2021-03-07 DIAGNOSIS — L97819 Non-pressure chronic ulcer of other part of right lower leg with unspecified severity: Secondary | ICD-10-CM | POA: Diagnosis not present

## 2021-03-07 DIAGNOSIS — I87331 Chronic venous hypertension (idiopathic) with ulcer and inflammation of right lower extremity: Secondary | ICD-10-CM | POA: Diagnosis not present

## 2021-03-07 DIAGNOSIS — I2721 Secondary pulmonary arterial hypertension: Secondary | ICD-10-CM | POA: Diagnosis not present

## 2021-03-07 DIAGNOSIS — M341 CR(E)ST syndrome: Secondary | ICD-10-CM | POA: Diagnosis not present

## 2021-03-07 DIAGNOSIS — M3481 Systemic sclerosis with lung involvement: Secondary | ICD-10-CM | POA: Diagnosis not present

## 2021-03-07 DIAGNOSIS — J8489 Other specified interstitial pulmonary diseases: Secondary | ICD-10-CM | POA: Diagnosis not present

## 2021-03-08 ENCOUNTER — Telehealth: Payer: Self-pay | Admitting: Internal Medicine

## 2021-03-08 ENCOUNTER — Encounter: Payer: Self-pay | Admitting: Internal Medicine

## 2021-03-08 DIAGNOSIS — M349 Systemic sclerosis, unspecified: Secondary | ICD-10-CM | POA: Diagnosis not present

## 2021-03-08 DIAGNOSIS — L97912 Non-pressure chronic ulcer of unspecified part of right lower leg with fat layer exposed: Secondary | ICD-10-CM | POA: Diagnosis not present

## 2021-03-08 DIAGNOSIS — L97921 Non-pressure chronic ulcer of unspecified part of left lower leg limited to breakdown of skin: Secondary | ICD-10-CM | POA: Diagnosis not present

## 2021-03-08 DIAGNOSIS — L97519 Non-pressure chronic ulcer of other part of right foot with unspecified severity: Secondary | ICD-10-CM | POA: Diagnosis not present

## 2021-03-08 DIAGNOSIS — I890 Lymphedema, not elsewhere classified: Secondary | ICD-10-CM | POA: Diagnosis not present

## 2021-03-08 DIAGNOSIS — R269 Unspecified abnormalities of gait and mobility: Secondary | ICD-10-CM | POA: Diagnosis not present

## 2021-03-08 DIAGNOSIS — L97521 Non-pressure chronic ulcer of other part of left foot limited to breakdown of skin: Secondary | ICD-10-CM | POA: Diagnosis not present

## 2021-03-08 DIAGNOSIS — D8989 Other specified disorders involving the immune mechanism, not elsewhere classified: Secondary | ICD-10-CM | POA: Diagnosis not present

## 2021-03-08 DIAGNOSIS — I10 Essential (primary) hypertension: Secondary | ICD-10-CM | POA: Diagnosis not present

## 2021-03-08 DIAGNOSIS — I872 Venous insufficiency (chronic) (peripheral): Secondary | ICD-10-CM | POA: Diagnosis not present

## 2021-03-08 DIAGNOSIS — R600 Localized edema: Secondary | ICD-10-CM | POA: Diagnosis not present

## 2021-03-08 NOTE — Telephone Encounter (Signed)
Received call from husband. They are at [facility] for an opinion regarding leg wound. We faxed 110 pages to [facility]. We have confirmed they have been received and are in [facility]'s system.  Amputation has been suggested by ID and Plastic Surgery here here. Patient and husband asked to go there to see if there are other options.

## 2021-03-09 ENCOUNTER — Other Ambulatory Visit (HOSPITAL_COMMUNITY): Payer: Self-pay | Admitting: *Deleted

## 2021-03-09 ENCOUNTER — Ambulatory Visit: Payer: Medicare Other | Admitting: Orthopedic Surgery

## 2021-03-09 ENCOUNTER — Telehealth: Payer: Self-pay | Admitting: Internal Medicine

## 2021-03-09 DIAGNOSIS — J8489 Other specified interstitial pulmonary diseases: Secondary | ICD-10-CM | POA: Diagnosis not present

## 2021-03-09 DIAGNOSIS — I2721 Secondary pulmonary arterial hypertension: Secondary | ICD-10-CM | POA: Diagnosis not present

## 2021-03-09 DIAGNOSIS — M3481 Systemic sclerosis with lung involvement: Secondary | ICD-10-CM | POA: Diagnosis not present

## 2021-03-09 DIAGNOSIS — I87331 Chronic venous hypertension (idiopathic) with ulcer and inflammation of right lower extremity: Secondary | ICD-10-CM | POA: Diagnosis not present

## 2021-03-09 DIAGNOSIS — L97819 Non-pressure chronic ulcer of other part of right lower leg with unspecified severity: Secondary | ICD-10-CM | POA: Diagnosis not present

## 2021-03-09 DIAGNOSIS — M341 CR(E)ST syndrome: Secondary | ICD-10-CM | POA: Diagnosis not present

## 2021-03-09 MED ORDER — DILTIAZEM HCL ER COATED BEADS 180 MG PO CP24: 180.0000 mg | ORAL_CAPSULE | Freq: Every day | ORAL | 3 refills | Status: DC

## 2021-03-09 NOTE — Telephone Encounter (Signed)
Called and spoke with Al about [name] [name]'s appointment at [facility] yesterday, they were very encouraged. No talk of amputation. Great experience! 3 Nurse Practiceners, Proactive approach, They have their own gels and ointments and a foam to put in between her toes, and also something to put own the infection that will kill the bacteria infection that her receiving IV antibiotics didn't touch. They were very positive, also want to get their own Vascular studies, then go from there. She goes back in 2 weeks.

## 2021-03-10 ENCOUNTER — Other Ambulatory Visit (HOSPITAL_COMMUNITY): Payer: Self-pay | Admitting: *Deleted

## 2021-03-10 MED ORDER — DILTIAZEM HCL ER COATED BEADS 180 MG PO CP24: 180.0000 mg | ORAL_CAPSULE | Freq: Every day | ORAL | 3 refills | Status: DC

## 2021-03-13 ENCOUNTER — Encounter (HOSPITAL_COMMUNITY): Payer: Medicare Other

## 2021-03-13 ENCOUNTER — Encounter (HOSPITAL_BASED_OUTPATIENT_CLINIC_OR_DEPARTMENT_OTHER): Payer: Medicare Other | Admitting: Internal Medicine

## 2021-03-13 ENCOUNTER — Ambulatory Visit: Payer: Medicare Other | Admitting: Surgery

## 2021-03-13 DIAGNOSIS — J8489 Other specified interstitial pulmonary diseases: Secondary | ICD-10-CM | POA: Diagnosis not present

## 2021-03-13 DIAGNOSIS — M341 CR(E)ST syndrome: Secondary | ICD-10-CM | POA: Diagnosis not present

## 2021-03-13 DIAGNOSIS — M3481 Systemic sclerosis with lung involvement: Secondary | ICD-10-CM | POA: Diagnosis not present

## 2021-03-13 DIAGNOSIS — I87331 Chronic venous hypertension (idiopathic) with ulcer and inflammation of right lower extremity: Secondary | ICD-10-CM | POA: Diagnosis not present

## 2021-03-13 DIAGNOSIS — L97819 Non-pressure chronic ulcer of other part of right lower leg with unspecified severity: Secondary | ICD-10-CM | POA: Diagnosis not present

## 2021-03-13 DIAGNOSIS — I2721 Secondary pulmonary arterial hypertension: Secondary | ICD-10-CM | POA: Diagnosis not present

## 2021-03-14 DIAGNOSIS — J8489 Other specified interstitial pulmonary diseases: Secondary | ICD-10-CM | POA: Diagnosis not present

## 2021-03-14 DIAGNOSIS — I87331 Chronic venous hypertension (idiopathic) with ulcer and inflammation of right lower extremity: Secondary | ICD-10-CM | POA: Diagnosis not present

## 2021-03-14 DIAGNOSIS — L97819 Non-pressure chronic ulcer of other part of right lower leg with unspecified severity: Secondary | ICD-10-CM | POA: Diagnosis not present

## 2021-03-14 DIAGNOSIS — M3481 Systemic sclerosis with lung involvement: Secondary | ICD-10-CM | POA: Diagnosis not present

## 2021-03-14 DIAGNOSIS — M341 CR(E)ST syndrome: Secondary | ICD-10-CM | POA: Diagnosis not present

## 2021-03-14 DIAGNOSIS — I2721 Secondary pulmonary arterial hypertension: Secondary | ICD-10-CM | POA: Diagnosis not present

## 2021-03-15 ENCOUNTER — Ambulatory Visit: Payer: Medicare Other | Admitting: Surgical

## 2021-03-15 DIAGNOSIS — M3481 Systemic sclerosis with lung involvement: Secondary | ICD-10-CM | POA: Diagnosis not present

## 2021-03-15 DIAGNOSIS — I87331 Chronic venous hypertension (idiopathic) with ulcer and inflammation of right lower extremity: Secondary | ICD-10-CM | POA: Diagnosis not present

## 2021-03-15 DIAGNOSIS — I2721 Secondary pulmonary arterial hypertension: Secondary | ICD-10-CM | POA: Diagnosis not present

## 2021-03-15 DIAGNOSIS — J8489 Other specified interstitial pulmonary diseases: Secondary | ICD-10-CM | POA: Diagnosis not present

## 2021-03-15 DIAGNOSIS — L97819 Non-pressure chronic ulcer of other part of right lower leg with unspecified severity: Secondary | ICD-10-CM | POA: Diagnosis not present

## 2021-03-15 DIAGNOSIS — M341 CR(E)ST syndrome: Secondary | ICD-10-CM | POA: Diagnosis not present

## 2021-03-15 IMAGING — MR MR HEAD W/O CM
10 series · 48 of 48 positions shown · non-contrast
Comparison: CT February 05, 2021.

CLINICAL DATA: Weakness.  Mental status change.

EXAM:
MRI HEAD WITHOUT CONTRAST
TECHNIQUE: Multiplanar, multiecho pulse sequences of the brain and surrounding
structures were obtained without intravenous contrast.

[Series 5: DWI · axial · 3.0mm · 0.92mm/px · z∈[-98,+54]mm · 10 of 104 slices shown (1 of 4)]
[im 1/104]
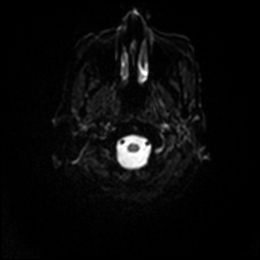
[im 12/104]
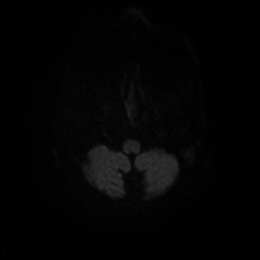
[im 23/104]
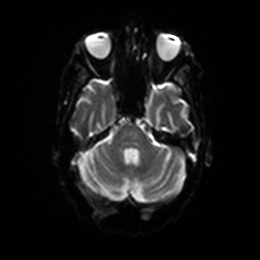
[im 35/104]
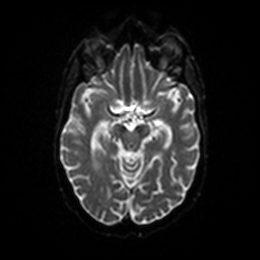
[im 46/104]
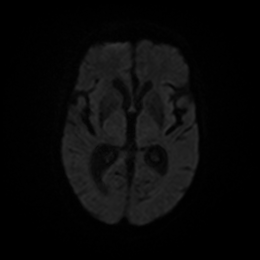
[im 58/104]
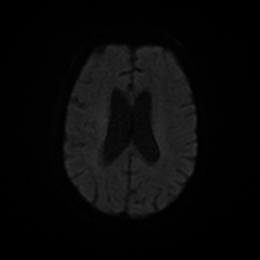
[im 69/104]
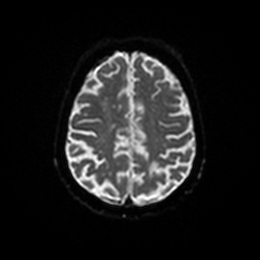
[im 81/104]
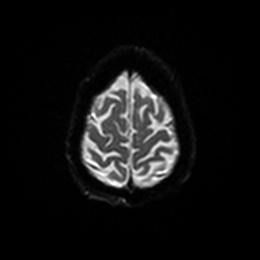
[im 92/104]
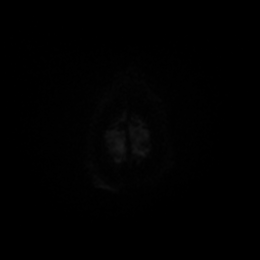
[im 104/104]
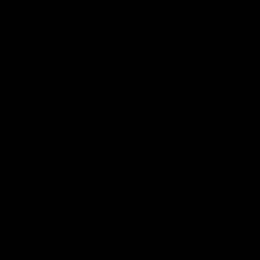

[Series 6: DWI · axial · 3.0mm · 0.92mm/px · z∈[-98,+51]mm · 5 of 51 slices shown (2 of 4)]
[im 1/51]
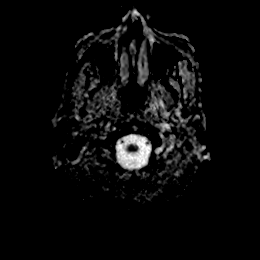
[im 13/51]
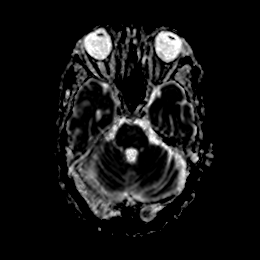
[im 26/51]
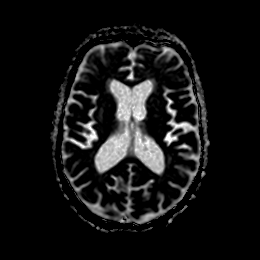
[im 38/51]
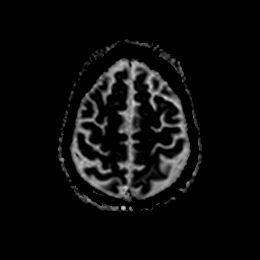
[im 51/51]
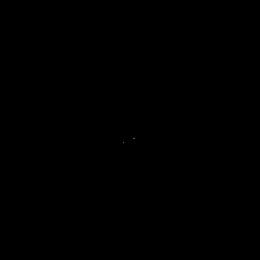

[Series 7: DWI · coronal · 4.0mm · 0.88mm/px · 7 of 68 slices shown (3 of 4)]
[im 1/68]
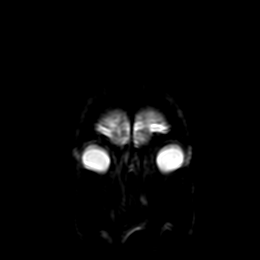
[im 12/68]
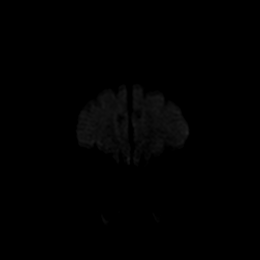
[im 23/68]
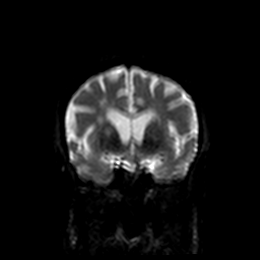
[im 34/68]
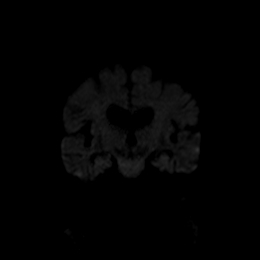
[im 45/68]
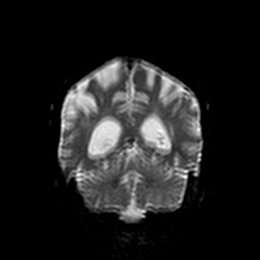
[im 56/68]
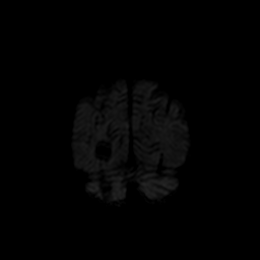
[im 68/68]
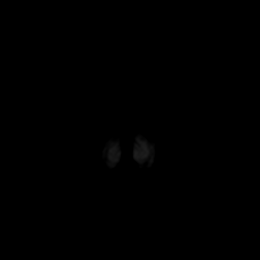

[Series 8: DWI · coronal · 4.0mm · 0.88mm/px · 4 of 34 slices shown (4 of 4)]
[im 1/34]
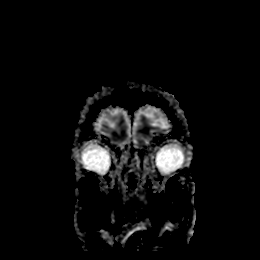
[im 12/34]
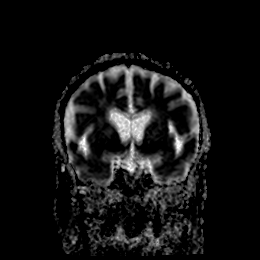
[im 23/34]
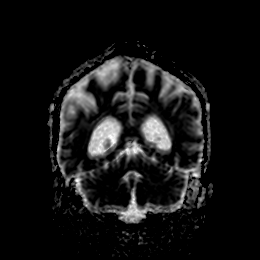
[im 34/34]
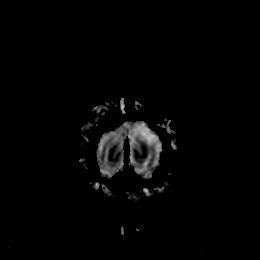

[Series 10: T2 · axial · 5.0mm · 0.75mm/px · z∈[-115,+71]mm · 4 of 32 slices shown (1 of 2)]
[im 1/32]
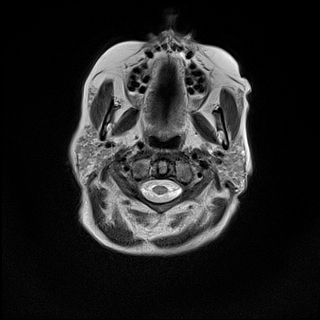
[im 11/32]
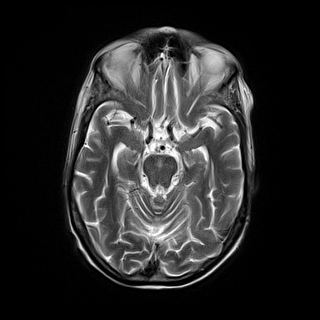
[im 21/32]
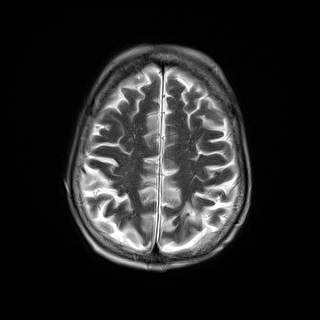
[im 32/32]
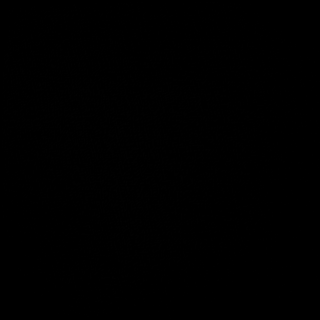

[Series 11: FLAIR · axial · 5.0mm · 0.90mm/px · z∈[-114,+71]mm · 4 of 32 slices shown (1 of 2)]
[im 1/32]
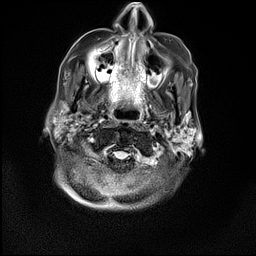
[im 11/32]
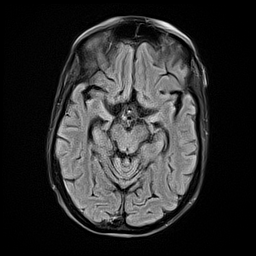
[im 21/32]
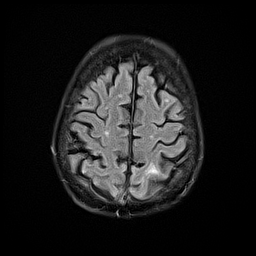
[im 32/32]
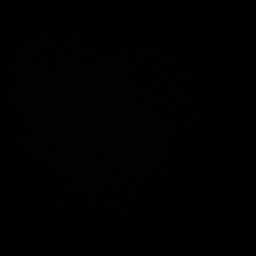

[Series 12: FLAIR · axial · 5.0mm · 0.90mm/px · z∈[-114,+71]mm · 4 of 32 slices shown (2 of 2)]
[im 1/32]
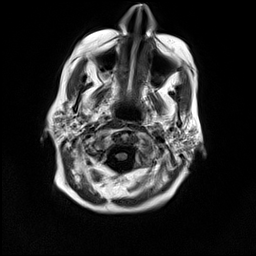
[im 11/32]
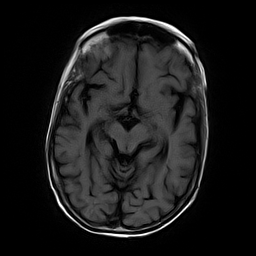
[im 21/32]
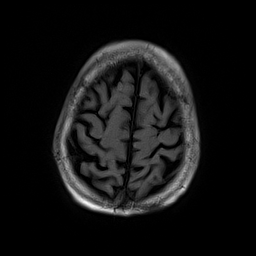
[im 32/32]
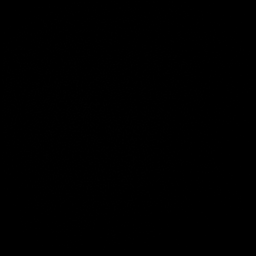

[Series 13: ax hemo · axial · 5.0mm · 0.86mm/px · z∈[-113,+73]mm · 4 of 32 slices shown]
[im 1/32]
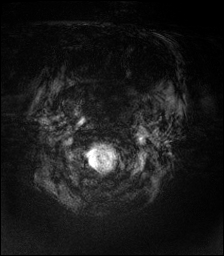
[im 11/32]
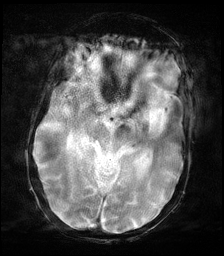
[im 21/32]
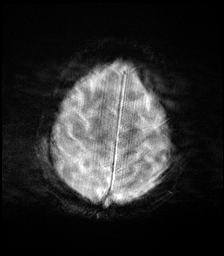
[im 32/32]
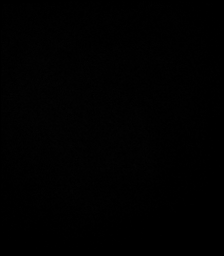

[Series 14: T2 · coronal · 5.0mm · 0.72mm/px · 3 of 28 slices shown (2 of 2)]
[im 1/28]
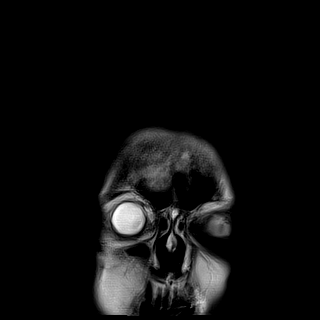
[im 14/28]
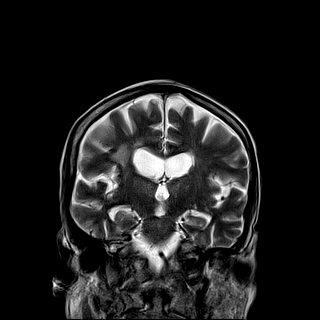
[im 28/28]
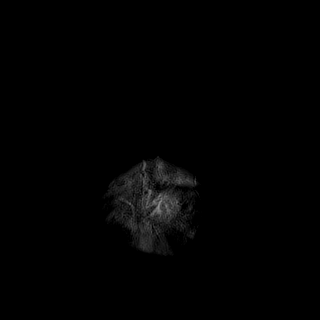

[Series 15: T1 · sagittal · 5.0mm · 0.90mm/px · 3 of 23 slices shown]
[im 1/23]
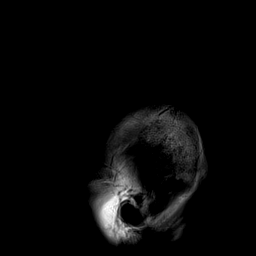
[im 12/23]
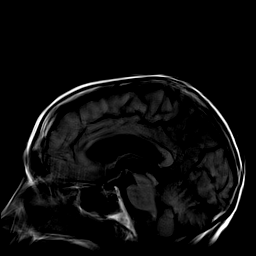
[im 23/23]
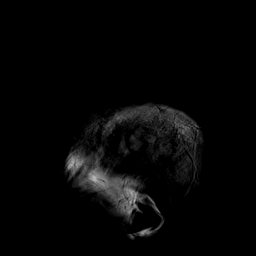

[48 of 48 positions shown; findings below may reference images not displayed]

FINDINGS: Motion limited exam.

Brain: No acute infarction, acute hemorrhage, hydrocephalus,
extra-axial collection or mass lesion. Moderate diffuse cerebral
atrophy with ex vacuo ventricular dilation. Moderate patchy T2/FLAIR
hyperintensities within the white matter and pons, likely the
sequela of chronic microvascular ischemic disease. Remote cortical
infarct in the high left parietal lobe. Remote right Djassem Class ir
lacunar infarct. Surrounding gliosis.

Vascular: Major arterial flow voids are maintained at the skull
base.

Skull and upper cervical spine: Normal marrow signal.

Sinuses/Orbits: Sinuses are largely clear.  Unremarkable orbits.

Other: Left mastoid effusion.
IMPRESSION: 1. No evidence of acute intracranial abnormality on this motion
limited exam.
2. Remote infarcts in the high left parietal cortex and right
cerebellum.
3. Moderate chronic microvascular ischemic disease and generalized
cerebral atrophy.
4. Left mastoid effusion.

## 2021-03-20 DIAGNOSIS — M341 CR(E)ST syndrome: Secondary | ICD-10-CM | POA: Diagnosis not present

## 2021-03-20 DIAGNOSIS — I2721 Secondary pulmonary arterial hypertension: Secondary | ICD-10-CM | POA: Diagnosis not present

## 2021-03-20 DIAGNOSIS — I87331 Chronic venous hypertension (idiopathic) with ulcer and inflammation of right lower extremity: Secondary | ICD-10-CM | POA: Diagnosis not present

## 2021-03-20 DIAGNOSIS — L97819 Non-pressure chronic ulcer of other part of right lower leg with unspecified severity: Secondary | ICD-10-CM | POA: Diagnosis not present

## 2021-03-20 DIAGNOSIS — J8489 Other specified interstitial pulmonary diseases: Secondary | ICD-10-CM | POA: Diagnosis not present

## 2021-03-20 DIAGNOSIS — M3481 Systemic sclerosis with lung involvement: Secondary | ICD-10-CM | POA: Diagnosis not present

## 2021-03-22 DIAGNOSIS — I110 Hypertensive heart disease with heart failure: Secondary | ICD-10-CM | POA: Diagnosis not present

## 2021-03-22 DIAGNOSIS — R269 Unspecified abnormalities of gait and mobility: Secondary | ICD-10-CM | POA: Diagnosis not present

## 2021-03-22 DIAGNOSIS — L97921 Non-pressure chronic ulcer of unspecified part of left lower leg limited to breakdown of skin: Secondary | ICD-10-CM | POA: Diagnosis not present

## 2021-03-22 DIAGNOSIS — T86821 Skin graft (allograft) (autograft) failure: Secondary | ICD-10-CM | POA: Diagnosis not present

## 2021-03-22 DIAGNOSIS — L97519 Non-pressure chronic ulcer of other part of right foot with unspecified severity: Secondary | ICD-10-CM | POA: Diagnosis not present

## 2021-03-22 DIAGNOSIS — L97521 Non-pressure chronic ulcer of other part of left foot limited to breakdown of skin: Secondary | ICD-10-CM | POA: Diagnosis not present

## 2021-03-22 DIAGNOSIS — I878 Other specified disorders of veins: Secondary | ICD-10-CM | POA: Diagnosis not present

## 2021-03-22 DIAGNOSIS — L97912 Non-pressure chronic ulcer of unspecified part of right lower leg with fat layer exposed: Secondary | ICD-10-CM | POA: Diagnosis not present

## 2021-03-22 DIAGNOSIS — I890 Lymphedema, not elsewhere classified: Secondary | ICD-10-CM | POA: Diagnosis not present

## 2021-03-22 DIAGNOSIS — I739 Peripheral vascular disease, unspecified: Secondary | ICD-10-CM | POA: Diagnosis not present

## 2021-03-22 DIAGNOSIS — M349 Systemic sclerosis, unspecified: Secondary | ICD-10-CM | POA: Diagnosis not present

## 2021-03-22 DIAGNOSIS — I872 Venous insufficiency (chronic) (peripheral): Secondary | ICD-10-CM | POA: Diagnosis not present

## 2021-03-22 DIAGNOSIS — D649 Anemia, unspecified: Secondary | ICD-10-CM | POA: Diagnosis not present

## 2021-03-22 DIAGNOSIS — M341 CR(E)ST syndrome: Secondary | ICD-10-CM | POA: Diagnosis not present

## 2021-03-22 DIAGNOSIS — R600 Localized edema: Secondary | ICD-10-CM | POA: Diagnosis not present

## 2021-03-23 DIAGNOSIS — J8489 Other specified interstitial pulmonary diseases: Secondary | ICD-10-CM | POA: Diagnosis not present

## 2021-03-23 DIAGNOSIS — M3481 Systemic sclerosis with lung involvement: Secondary | ICD-10-CM | POA: Diagnosis not present

## 2021-03-23 DIAGNOSIS — L97819 Non-pressure chronic ulcer of other part of right lower leg with unspecified severity: Secondary | ICD-10-CM | POA: Diagnosis not present

## 2021-03-23 DIAGNOSIS — M341 CR(E)ST syndrome: Secondary | ICD-10-CM | POA: Diagnosis not present

## 2021-03-23 DIAGNOSIS — I87331 Chronic venous hypertension (idiopathic) with ulcer and inflammation of right lower extremity: Secondary | ICD-10-CM | POA: Diagnosis not present

## 2021-03-23 DIAGNOSIS — I2721 Secondary pulmonary arterial hypertension: Secondary | ICD-10-CM | POA: Diagnosis not present

## 2021-03-24 DIAGNOSIS — M3481 Systemic sclerosis with lung involvement: Secondary | ICD-10-CM | POA: Diagnosis not present

## 2021-03-24 DIAGNOSIS — I2721 Secondary pulmonary arterial hypertension: Secondary | ICD-10-CM | POA: Diagnosis not present

## 2021-03-24 DIAGNOSIS — L97819 Non-pressure chronic ulcer of other part of right lower leg with unspecified severity: Secondary | ICD-10-CM | POA: Diagnosis not present

## 2021-03-24 DIAGNOSIS — M341 CR(E)ST syndrome: Secondary | ICD-10-CM | POA: Diagnosis not present

## 2021-03-24 DIAGNOSIS — I87331 Chronic venous hypertension (idiopathic) with ulcer and inflammation of right lower extremity: Secondary | ICD-10-CM | POA: Diagnosis not present

## 2021-03-24 DIAGNOSIS — J8489 Other specified interstitial pulmonary diseases: Secondary | ICD-10-CM | POA: Diagnosis not present

## 2021-03-28 DIAGNOSIS — L97819 Non-pressure chronic ulcer of other part of right lower leg with unspecified severity: Secondary | ICD-10-CM | POA: Diagnosis not present

## 2021-03-28 DIAGNOSIS — I87331 Chronic venous hypertension (idiopathic) with ulcer and inflammation of right lower extremity: Secondary | ICD-10-CM | POA: Diagnosis not present

## 2021-03-28 DIAGNOSIS — M341 CR(E)ST syndrome: Secondary | ICD-10-CM | POA: Diagnosis not present

## 2021-03-28 DIAGNOSIS — J8489 Other specified interstitial pulmonary diseases: Secondary | ICD-10-CM | POA: Diagnosis not present

## 2021-03-28 DIAGNOSIS — M3481 Systemic sclerosis with lung involvement: Secondary | ICD-10-CM | POA: Diagnosis not present

## 2021-03-28 DIAGNOSIS — I2721 Secondary pulmonary arterial hypertension: Secondary | ICD-10-CM | POA: Diagnosis not present

## 2021-03-29 DIAGNOSIS — D649 Anemia, unspecified: Secondary | ICD-10-CM | POA: Diagnosis not present

## 2021-03-29 DIAGNOSIS — L97519 Non-pressure chronic ulcer of other part of right foot with unspecified severity: Secondary | ICD-10-CM | POA: Diagnosis not present

## 2021-03-29 DIAGNOSIS — I878 Other specified disorders of veins: Secondary | ICD-10-CM | POA: Diagnosis not present

## 2021-03-29 DIAGNOSIS — I110 Hypertensive heart disease with heart failure: Secondary | ICD-10-CM | POA: Diagnosis not present

## 2021-03-29 DIAGNOSIS — L97912 Non-pressure chronic ulcer of unspecified part of right lower leg with fat layer exposed: Secondary | ICD-10-CM | POA: Diagnosis not present

## 2021-03-29 DIAGNOSIS — L97521 Non-pressure chronic ulcer of other part of left foot limited to breakdown of skin: Secondary | ICD-10-CM | POA: Diagnosis not present

## 2021-03-29 DIAGNOSIS — R269 Unspecified abnormalities of gait and mobility: Secondary | ICD-10-CM | POA: Diagnosis not present

## 2021-03-29 DIAGNOSIS — M341 CR(E)ST syndrome: Secondary | ICD-10-CM | POA: Diagnosis not present

## 2021-03-29 DIAGNOSIS — I890 Lymphedema, not elsewhere classified: Secondary | ICD-10-CM | POA: Diagnosis not present

## 2021-03-29 DIAGNOSIS — I739 Peripheral vascular disease, unspecified: Secondary | ICD-10-CM | POA: Diagnosis not present

## 2021-03-31 ENCOUNTER — Telehealth (HOSPITAL_COMMUNITY): Payer: Self-pay | Admitting: *Deleted

## 2021-03-31 DIAGNOSIS — J8489 Other specified interstitial pulmonary diseases: Secondary | ICD-10-CM | POA: Diagnosis not present

## 2021-03-31 DIAGNOSIS — L97819 Non-pressure chronic ulcer of other part of right lower leg with unspecified severity: Secondary | ICD-10-CM | POA: Diagnosis not present

## 2021-03-31 DIAGNOSIS — M341 CR(E)ST syndrome: Secondary | ICD-10-CM | POA: Diagnosis not present

## 2021-03-31 DIAGNOSIS — I2721 Secondary pulmonary arterial hypertension: Secondary | ICD-10-CM | POA: Diagnosis not present

## 2021-03-31 DIAGNOSIS — M3481 Systemic sclerosis with lung involvement: Secondary | ICD-10-CM | POA: Diagnosis not present

## 2021-03-31 DIAGNOSIS — I87331 Chronic venous hypertension (idiopathic) with ulcer and inflammation of right lower extremity: Secondary | ICD-10-CM | POA: Diagnosis not present

## 2021-03-31 NOTE — Telephone Encounter (Signed)
Ok to stop

## 2021-03-31 NOTE — Telephone Encounter (Signed)
Pt left VM stating diltiazem has her feeling very lethargic and wants to decrease or stop medication. Pt said she has no energy since medication was added.   Routed to [facility] for advice

## 2021-03-31 NOTE — Telephone Encounter (Signed)
Called pt to advise no answer/voicemail box full.

## 2021-04-02 DIAGNOSIS — I70209 Unspecified atherosclerosis of native arteries of extremities, unspecified extremity: Secondary | ICD-10-CM | POA: Diagnosis not present

## 2021-04-02 DIAGNOSIS — E039 Hypothyroidism, unspecified: Secondary | ICD-10-CM | POA: Diagnosis not present

## 2021-04-02 DIAGNOSIS — I130 Hypertensive heart and chronic kidney disease with heart failure and stage 1 through stage 4 chronic kidney disease, or unspecified chronic kidney disease: Secondary | ICD-10-CM | POA: Diagnosis not present

## 2021-04-02 DIAGNOSIS — K2940 Chronic atrophic gastritis without bleeding: Secondary | ICD-10-CM | POA: Diagnosis not present

## 2021-04-02 DIAGNOSIS — E782 Mixed hyperlipidemia: Secondary | ICD-10-CM | POA: Diagnosis not present

## 2021-04-02 DIAGNOSIS — I2721 Secondary pulmonary arterial hypertension: Secondary | ICD-10-CM | POA: Diagnosis not present

## 2021-04-02 DIAGNOSIS — M069 Rheumatoid arthritis, unspecified: Secondary | ICD-10-CM | POA: Diagnosis not present

## 2021-04-02 DIAGNOSIS — J9610 Chronic respiratory failure, unspecified whether with hypoxia or hypercapnia: Secondary | ICD-10-CM | POA: Diagnosis not present

## 2021-04-02 DIAGNOSIS — M3481 Systemic sclerosis with lung involvement: Secondary | ICD-10-CM | POA: Diagnosis not present

## 2021-04-02 DIAGNOSIS — K2100 Gastro-esophageal reflux disease with esophagitis, without bleeding: Secondary | ICD-10-CM | POA: Diagnosis not present

## 2021-04-02 DIAGNOSIS — H6993 Unspecified Eustachian tube disorder, bilateral: Secondary | ICD-10-CM | POA: Diagnosis not present

## 2021-04-02 DIAGNOSIS — F419 Anxiety disorder, unspecified: Secondary | ICD-10-CM | POA: Diagnosis not present

## 2021-04-02 DIAGNOSIS — H906 Mixed conductive and sensorineural hearing loss, bilateral: Secondary | ICD-10-CM | POA: Diagnosis not present

## 2021-04-02 DIAGNOSIS — I8501 Esophageal varices with bleeding: Secondary | ICD-10-CM | POA: Diagnosis not present

## 2021-04-02 DIAGNOSIS — I87331 Chronic venous hypertension (idiopathic) with ulcer and inflammation of right lower extremity: Secondary | ICD-10-CM | POA: Diagnosis not present

## 2021-04-02 DIAGNOSIS — M341 CR(E)ST syndrome: Secondary | ICD-10-CM | POA: Diagnosis not present

## 2021-04-02 DIAGNOSIS — N1831 Chronic kidney disease, stage 3a: Secondary | ICD-10-CM | POA: Diagnosis not present

## 2021-04-02 DIAGNOSIS — I50810 Right heart failure, unspecified: Secondary | ICD-10-CM | POA: Diagnosis not present

## 2021-04-02 DIAGNOSIS — I82401 Acute embolism and thrombosis of unspecified deep veins of right lower extremity: Secondary | ICD-10-CM | POA: Diagnosis not present

## 2021-04-02 DIAGNOSIS — L97812 Non-pressure chronic ulcer of other part of right lower leg with fat layer exposed: Secondary | ICD-10-CM | POA: Diagnosis not present

## 2021-04-02 DIAGNOSIS — D631 Anemia in chronic kidney disease: Secondary | ICD-10-CM | POA: Diagnosis not present

## 2021-04-02 DIAGNOSIS — J8489 Other specified interstitial pulmonary diseases: Secondary | ICD-10-CM | POA: Diagnosis not present

## 2021-04-02 DIAGNOSIS — D500 Iron deficiency anemia secondary to blood loss (chronic): Secondary | ICD-10-CM | POA: Diagnosis not present

## 2021-04-02 DIAGNOSIS — E440 Moderate protein-calorie malnutrition: Secondary | ICD-10-CM | POA: Diagnosis not present

## 2021-04-03 ENCOUNTER — Telehealth (HOSPITAL_COMMUNITY): Payer: Self-pay | Admitting: Internal Medicine

## 2021-04-03 DIAGNOSIS — I2721 Secondary pulmonary arterial hypertension: Secondary | ICD-10-CM | POA: Diagnosis not present

## 2021-04-03 DIAGNOSIS — I87331 Chronic venous hypertension (idiopathic) with ulcer and inflammation of right lower extremity: Secondary | ICD-10-CM | POA: Diagnosis not present

## 2021-04-03 DIAGNOSIS — J8489 Other specified interstitial pulmonary diseases: Secondary | ICD-10-CM | POA: Diagnosis not present

## 2021-04-03 DIAGNOSIS — M341 CR(E)ST syndrome: Secondary | ICD-10-CM | POA: Diagnosis not present

## 2021-04-03 DIAGNOSIS — L97812 Non-pressure chronic ulcer of other part of right lower leg with fat layer exposed: Secondary | ICD-10-CM | POA: Diagnosis not present

## 2021-04-03 DIAGNOSIS — M3481 Systemic sclerosis with lung involvement: Secondary | ICD-10-CM | POA: Diagnosis not present

## 2021-04-03 NOTE — Telephone Encounter (Signed)
Medication Consultation , please call pt _0 [phone]

## 2021-04-04 DIAGNOSIS — I872 Venous insufficiency (chronic) (peripheral): Secondary | ICD-10-CM | POA: Diagnosis not present

## 2021-04-04 DIAGNOSIS — L97921 Non-pressure chronic ulcer of unspecified part of left lower leg limited to breakdown of skin: Secondary | ICD-10-CM | POA: Diagnosis not present

## 2021-04-04 DIAGNOSIS — L97912 Non-pressure chronic ulcer of unspecified part of right lower leg with fat layer exposed: Secondary | ICD-10-CM | POA: Diagnosis not present

## 2021-04-04 DIAGNOSIS — L97519 Non-pressure chronic ulcer of other part of right foot with unspecified severity: Secondary | ICD-10-CM | POA: Diagnosis not present

## 2021-04-04 DIAGNOSIS — L97521 Non-pressure chronic ulcer of other part of left foot limited to breakdown of skin: Secondary | ICD-10-CM | POA: Diagnosis not present

## 2021-04-04 DIAGNOSIS — T86821 Skin graft (allograft) (autograft) failure: Secondary | ICD-10-CM | POA: Diagnosis not present

## 2021-04-04 NOTE — Telephone Encounter (Signed)
Spoke with pt and advised pt to stop diltiazem per Dr.Bensimhons advice on 03/31/21.

## 2021-04-05 DIAGNOSIS — I87331 Chronic venous hypertension (idiopathic) with ulcer and inflammation of right lower extremity: Secondary | ICD-10-CM | POA: Diagnosis not present

## 2021-04-05 DIAGNOSIS — J8489 Other specified interstitial pulmonary diseases: Secondary | ICD-10-CM | POA: Diagnosis not present

## 2021-04-05 DIAGNOSIS — L97812 Non-pressure chronic ulcer of other part of right lower leg with fat layer exposed: Secondary | ICD-10-CM | POA: Diagnosis not present

## 2021-04-05 DIAGNOSIS — M341 CR(E)ST syndrome: Secondary | ICD-10-CM | POA: Diagnosis not present

## 2021-04-05 DIAGNOSIS — M3481 Systemic sclerosis with lung involvement: Secondary | ICD-10-CM | POA: Diagnosis not present

## 2021-04-05 DIAGNOSIS — I2721 Secondary pulmonary arterial hypertension: Secondary | ICD-10-CM | POA: Diagnosis not present

## 2021-04-06 ENCOUNTER — Inpatient Hospital Stay (HOSPITAL_BASED_OUTPATIENT_CLINIC_OR_DEPARTMENT_OTHER)
Admission: EM | Admit: 2021-04-06 | Discharge: 2021-04-30 | DRG: 871 | Disposition: E | Payer: Medicare Other | Attending: Internal Medicine | Admitting: Internal Medicine

## 2021-04-06 ENCOUNTER — Other Ambulatory Visit: Payer: Self-pay

## 2021-04-06 ENCOUNTER — Emergency Department (HOSPITAL_BASED_OUTPATIENT_CLINIC_OR_DEPARTMENT_OTHER): Payer: Medicare Other

## 2021-04-06 ENCOUNTER — Encounter (HOSPITAL_BASED_OUTPATIENT_CLINIC_OR_DEPARTMENT_OTHER): Payer: Self-pay

## 2021-04-06 ENCOUNTER — Telehealth: Payer: Self-pay | Admitting: Internal Medicine

## 2021-04-06 DIAGNOSIS — M2012 Hallux valgus (acquired), left foot: Secondary | ICD-10-CM | POA: Diagnosis not present

## 2021-04-06 DIAGNOSIS — Z9221 Personal history of antineoplastic chemotherapy: Secondary | ICD-10-CM

## 2021-04-06 DIAGNOSIS — I480 Paroxysmal atrial fibrillation: Secondary | ICD-10-CM | POA: Diagnosis not present

## 2021-04-06 DIAGNOSIS — I10 Essential (primary) hypertension: Secondary | ICD-10-CM | POA: Diagnosis not present

## 2021-04-06 DIAGNOSIS — R0602 Shortness of breath: Secondary | ICD-10-CM

## 2021-04-06 DIAGNOSIS — I452 Bifascicular block: Secondary | ICD-10-CM | POA: Diagnosis not present

## 2021-04-06 DIAGNOSIS — Z880 Allergy status to penicillin: Secondary | ICD-10-CM | POA: Diagnosis not present

## 2021-04-06 DIAGNOSIS — B965 Pseudomonas (aeruginosa) (mallei) (pseudomallei) as the cause of diseases classified elsewhere: Secondary | ICD-10-CM | POA: Diagnosis not present

## 2021-04-06 DIAGNOSIS — R6510 Systemic inflammatory response syndrome (SIRS) of non-infectious origin without acute organ dysfunction: Secondary | ICD-10-CM | POA: Diagnosis not present

## 2021-04-06 DIAGNOSIS — I70261 Atherosclerosis of native arteries of extremities with gangrene, right leg: Secondary | ICD-10-CM | POA: Diagnosis not present

## 2021-04-06 DIAGNOSIS — S81802A Unspecified open wound, left lower leg, initial encounter: Secondary | ICD-10-CM | POA: Diagnosis not present

## 2021-04-06 DIAGNOSIS — Z20822 Contact with and (suspected) exposure to covid-19: Secondary | ICD-10-CM | POA: Diagnosis not present

## 2021-04-06 DIAGNOSIS — Z7189 Other specified counseling: Secondary | ICD-10-CM | POA: Diagnosis not present

## 2021-04-06 DIAGNOSIS — E871 Hypo-osmolality and hyponatremia: Secondary | ICD-10-CM | POA: Diagnosis present

## 2021-04-06 DIAGNOSIS — A419 Sepsis, unspecified organism: Secondary | ICD-10-CM | POA: Diagnosis not present

## 2021-04-06 DIAGNOSIS — L03032 Cellulitis of left toe: Secondary | ICD-10-CM | POA: Diagnosis present

## 2021-04-06 DIAGNOSIS — I5032 Chronic diastolic (congestive) heart failure: Secondary | ICD-10-CM | POA: Diagnosis present

## 2021-04-06 DIAGNOSIS — E43 Unspecified severe protein-calorie malnutrition: Secondary | ICD-10-CM | POA: Diagnosis not present

## 2021-04-06 DIAGNOSIS — N1832 Chronic kidney disease, stage 3b: Secondary | ICD-10-CM | POA: Diagnosis present

## 2021-04-06 DIAGNOSIS — I130 Hypertensive heart and chronic kidney disease with heart failure and stage 1 through stage 4 chronic kidney disease, or unspecified chronic kidney disease: Secondary | ICD-10-CM | POA: Diagnosis not present

## 2021-04-06 DIAGNOSIS — Z681 Body mass index (BMI) 19 or less, adult: Secondary | ICD-10-CM

## 2021-04-06 DIAGNOSIS — N183 Chronic kidney disease, stage 3 unspecified: Secondary | ICD-10-CM | POA: Diagnosis present

## 2021-04-06 DIAGNOSIS — R54 Age-related physical debility: Secondary | ICD-10-CM | POA: Diagnosis present

## 2021-04-06 DIAGNOSIS — M341 CR(E)ST syndrome: Secondary | ICD-10-CM | POA: Diagnosis not present

## 2021-04-06 DIAGNOSIS — I779 Disorder of arteries and arterioles, unspecified: Secondary | ICD-10-CM | POA: Diagnosis present

## 2021-04-06 DIAGNOSIS — Z79899 Other long term (current) drug therapy: Secondary | ICD-10-CM

## 2021-04-06 DIAGNOSIS — H906 Mixed conductive and sensorineural hearing loss, bilateral: Secondary | ICD-10-CM | POA: Diagnosis present

## 2021-04-06 DIAGNOSIS — Z807 Family history of other malignant neoplasms of lymphoid, hematopoietic and related tissues: Secondary | ICD-10-CM

## 2021-04-06 DIAGNOSIS — M869 Osteomyelitis, unspecified: Secondary | ICD-10-CM | POA: Diagnosis present

## 2021-04-06 DIAGNOSIS — N179 Acute kidney failure, unspecified: Secondary | ICD-10-CM | POA: Diagnosis present

## 2021-04-06 DIAGNOSIS — Z86718 Personal history of other venous thrombosis and embolism: Secondary | ICD-10-CM

## 2021-04-06 DIAGNOSIS — I2721 Secondary pulmonary arterial hypertension: Secondary | ICD-10-CM | POA: Diagnosis present

## 2021-04-06 DIAGNOSIS — Z8572 Personal history of non-Hodgkin lymphomas: Secondary | ICD-10-CM

## 2021-04-06 DIAGNOSIS — K2100 Gastro-esophageal reflux disease with esophagitis, without bleeding: Secondary | ICD-10-CM | POA: Diagnosis present

## 2021-04-06 DIAGNOSIS — R52 Pain, unspecified: Secondary | ICD-10-CM | POA: Diagnosis not present

## 2021-04-06 DIAGNOSIS — S91301A Unspecified open wound, right foot, initial encounter: Secondary | ICD-10-CM | POA: Diagnosis not present

## 2021-04-06 DIAGNOSIS — J9611 Chronic respiratory failure with hypoxia: Secondary | ICD-10-CM | POA: Diagnosis present

## 2021-04-06 DIAGNOSIS — N170 Acute kidney failure with tubular necrosis: Secondary | ICD-10-CM | POA: Diagnosis present

## 2021-04-06 DIAGNOSIS — R6520 Severe sepsis without septic shock: Secondary | ICD-10-CM | POA: Diagnosis present

## 2021-04-06 DIAGNOSIS — Z4682 Encounter for fitting and adjustment of non-vascular catheter: Secondary | ICD-10-CM | POA: Diagnosis not present

## 2021-04-06 DIAGNOSIS — I2781 Cor pulmonale (chronic): Secondary | ICD-10-CM | POA: Diagnosis present

## 2021-04-06 DIAGNOSIS — Z7952 Long term (current) use of systemic steroids: Secondary | ICD-10-CM

## 2021-04-06 DIAGNOSIS — L0390 Cellulitis, unspecified: Secondary | ICD-10-CM | POA: Diagnosis present

## 2021-04-06 DIAGNOSIS — J90 Pleural effusion, not elsewhere classified: Secondary | ICD-10-CM | POA: Diagnosis not present

## 2021-04-06 DIAGNOSIS — S91002A Unspecified open wound, left ankle, initial encounter: Secondary | ICD-10-CM | POA: Diagnosis not present

## 2021-04-06 DIAGNOSIS — M86672 Other chronic osteomyelitis, left ankle and foot: Secondary | ICD-10-CM | POA: Diagnosis not present

## 2021-04-06 DIAGNOSIS — Z993 Dependence on wheelchair: Secondary | ICD-10-CM

## 2021-04-06 DIAGNOSIS — A4152 Sepsis due to Pseudomonas: Principal | ICD-10-CM | POA: Diagnosis present

## 2021-04-06 DIAGNOSIS — L97509 Non-pressure chronic ulcer of other part of unspecified foot with unspecified severity: Secondary | ICD-10-CM | POA: Diagnosis present

## 2021-04-06 DIAGNOSIS — M349 Systemic sclerosis, unspecified: Secondary | ICD-10-CM | POA: Diagnosis present

## 2021-04-06 DIAGNOSIS — Z95828 Presence of other vascular implants and grafts: Secondary | ICD-10-CM

## 2021-04-06 DIAGNOSIS — E870 Hyperosmolality and hypernatremia: Secondary | ICD-10-CM | POA: Diagnosis not present

## 2021-04-06 DIAGNOSIS — Z7902 Long term (current) use of antithrombotics/antiplatelets: Secondary | ICD-10-CM

## 2021-04-06 DIAGNOSIS — G9340 Encephalopathy, unspecified: Secondary | ICD-10-CM | POA: Diagnosis not present

## 2021-04-06 DIAGNOSIS — I2729 Other secondary pulmonary hypertension: Secondary | ICD-10-CM | POA: Diagnosis not present

## 2021-04-06 DIAGNOSIS — G928 Other toxic encephalopathy: Secondary | ICD-10-CM | POA: Diagnosis not present

## 2021-04-06 DIAGNOSIS — L97504 Non-pressure chronic ulcer of other part of unspecified foot with necrosis of bone: Secondary | ICD-10-CM | POA: Diagnosis not present

## 2021-04-06 DIAGNOSIS — E039 Hypothyroidism, unspecified: Secondary | ICD-10-CM | POA: Diagnosis present

## 2021-04-06 DIAGNOSIS — R531 Weakness: Secondary | ICD-10-CM | POA: Diagnosis not present

## 2021-04-06 DIAGNOSIS — R064 Hyperventilation: Secondary | ICD-10-CM

## 2021-04-06 DIAGNOSIS — I517 Cardiomegaly: Secondary | ICD-10-CM | POA: Diagnosis not present

## 2021-04-06 DIAGNOSIS — E46 Unspecified protein-calorie malnutrition: Secondary | ICD-10-CM

## 2021-04-06 DIAGNOSIS — A498 Other bacterial infections of unspecified site: Secondary | ICD-10-CM | POA: Diagnosis not present

## 2021-04-06 DIAGNOSIS — T17908A Unspecified foreign body in respiratory tract, part unspecified causing other injury, initial encounter: Secondary | ICD-10-CM

## 2021-04-06 DIAGNOSIS — E785 Hyperlipidemia, unspecified: Secondary | ICD-10-CM | POA: Diagnosis present

## 2021-04-06 DIAGNOSIS — D631 Anemia in chronic kidney disease: Secondary | ICD-10-CM | POA: Diagnosis present

## 2021-04-06 DIAGNOSIS — Z8619 Personal history of other infectious and parasitic diseases: Secondary | ICD-10-CM | POA: Diagnosis not present

## 2021-04-06 DIAGNOSIS — E1169 Type 2 diabetes mellitus with other specified complication: Secondary | ICD-10-CM | POA: Diagnosis present

## 2021-04-06 DIAGNOSIS — L89153 Pressure ulcer of sacral region, stage 3: Secondary | ICD-10-CM | POA: Diagnosis present

## 2021-04-06 DIAGNOSIS — Z881 Allergy status to other antibiotic agents status: Secondary | ICD-10-CM

## 2021-04-06 DIAGNOSIS — Z83438 Family history of other disorder of lipoprotein metabolism and other lipidemia: Secondary | ICD-10-CM

## 2021-04-06 DIAGNOSIS — I700 Atherosclerosis of aorta: Secondary | ICD-10-CM | POA: Diagnosis not present

## 2021-04-06 DIAGNOSIS — S91302A Unspecified open wound, left foot, initial encounter: Secondary | ICD-10-CM | POA: Diagnosis not present

## 2021-04-06 DIAGNOSIS — R7881 Bacteremia: Secondary | ICD-10-CM | POA: Diagnosis not present

## 2021-04-06 DIAGNOSIS — J849 Interstitial pulmonary disease, unspecified: Secondary | ICD-10-CM | POA: Diagnosis not present

## 2021-04-06 DIAGNOSIS — Z9981 Dependence on supplemental oxygen: Secondary | ICD-10-CM

## 2021-04-06 DIAGNOSIS — R0600 Dyspnea, unspecified: Secondary | ICD-10-CM | POA: Diagnosis not present

## 2021-04-06 DIAGNOSIS — I739 Peripheral vascular disease, unspecified: Secondary | ICD-10-CM | POA: Diagnosis not present

## 2021-04-06 DIAGNOSIS — Z515 Encounter for palliative care: Secondary | ICD-10-CM

## 2021-04-06 DIAGNOSIS — E876 Hypokalemia: Secondary | ICD-10-CM | POA: Diagnosis not present

## 2021-04-06 DIAGNOSIS — I272 Pulmonary hypertension, unspecified: Secondary | ICD-10-CM | POA: Diagnosis not present

## 2021-04-06 DIAGNOSIS — R21 Rash and other nonspecific skin eruption: Secondary | ICD-10-CM | POA: Diagnosis not present

## 2021-04-06 DIAGNOSIS — R64 Cachexia: Secondary | ICD-10-CM | POA: Diagnosis present

## 2021-04-06 DIAGNOSIS — M1990 Unspecified osteoarthritis, unspecified site: Secondary | ICD-10-CM | POA: Diagnosis present

## 2021-04-06 DIAGNOSIS — Z8679 Personal history of other diseases of the circulatory system: Secondary | ICD-10-CM | POA: Diagnosis not present

## 2021-04-06 DIAGNOSIS — E873 Alkalosis: Secondary | ICD-10-CM | POA: Diagnosis not present

## 2021-04-06 DIAGNOSIS — L03116 Cellulitis of left lower limb: Secondary | ICD-10-CM | POA: Diagnosis not present

## 2021-04-06 DIAGNOSIS — M7989 Other specified soft tissue disorders: Secondary | ICD-10-CM | POA: Diagnosis not present

## 2021-04-06 DIAGNOSIS — Z85820 Personal history of malignant melanoma of skin: Secondary | ICD-10-CM

## 2021-04-06 DIAGNOSIS — Z882 Allergy status to sulfonamides status: Secondary | ICD-10-CM

## 2021-04-06 DIAGNOSIS — J811 Chronic pulmonary edema: Secondary | ICD-10-CM | POA: Diagnosis not present

## 2021-04-06 DIAGNOSIS — J9621 Acute and chronic respiratory failure with hypoxia: Secondary | ICD-10-CM | POA: Diagnosis not present

## 2021-04-06 DIAGNOSIS — Z0189 Encounter for other specified special examinations: Secondary | ICD-10-CM

## 2021-04-06 DIAGNOSIS — J9622 Acute and chronic respiratory failure with hypercapnia: Secondary | ICD-10-CM | POA: Diagnosis not present

## 2021-04-06 DIAGNOSIS — Z832 Family history of diseases of the blood and blood-forming organs and certain disorders involving the immune mechanism: Secondary | ICD-10-CM

## 2021-04-06 DIAGNOSIS — Z66 Do not resuscitate: Secondary | ICD-10-CM | POA: Diagnosis not present

## 2021-04-06 DIAGNOSIS — R404 Transient alteration of awareness: Secondary | ICD-10-CM | POA: Diagnosis not present

## 2021-04-06 DIAGNOSIS — Z79890 Hormone replacement therapy (postmenopausal): Secondary | ICD-10-CM

## 2021-04-06 DIAGNOSIS — E860 Dehydration: Secondary | ICD-10-CM | POA: Diagnosis not present

## 2021-04-06 DIAGNOSIS — L97502 Non-pressure chronic ulcer of other part of unspecified foot with fat layer exposed: Secondary | ICD-10-CM | POA: Diagnosis not present

## 2021-04-06 DIAGNOSIS — L03119 Cellulitis of unspecified part of limb: Secondary | ICD-10-CM | POA: Diagnosis present

## 2021-04-06 DIAGNOSIS — L03115 Cellulitis of right lower limb: Secondary | ICD-10-CM | POA: Diagnosis not present

## 2021-04-06 DIAGNOSIS — I071 Rheumatic tricuspid insufficiency: Secondary | ICD-10-CM | POA: Diagnosis present

## 2021-04-06 DIAGNOSIS — S91001A Unspecified open wound, right ankle, initial encounter: Secondary | ICD-10-CM | POA: Diagnosis not present

## 2021-04-06 DIAGNOSIS — S81801A Unspecified open wound, right lower leg, initial encounter: Secondary | ICD-10-CM | POA: Diagnosis not present

## 2021-04-06 DIAGNOSIS — R4182 Altered mental status, unspecified: Secondary | ICD-10-CM | POA: Diagnosis not present

## 2021-04-06 LAB — PROTIME-INR
INR: 0.9 (ref 0.8–1.2)
Prothrombin Time: 11.7 seconds (ref 11.4–15.2)

## 2021-04-06 LAB — COMPREHENSIVE METABOLIC PANEL
ALT: 17 U/L (ref 0–44)
AST: 26 U/L (ref 15–41)
Albumin: 2.6 g/dL — ABNORMAL LOW (ref 3.5–5.0)
Alkaline Phosphatase: 119 U/L (ref 38–126)
Anion gap: 11 (ref 5–15)
BUN: 49 mg/dL — ABNORMAL HIGH (ref 8–23)
CO2: 25 mmol/L (ref 22–32)
Calcium: 8.5 mg/dL — ABNORMAL LOW (ref 8.9–10.3)
Chloride: 92 mmol/L — ABNORMAL LOW (ref 98–111)
Creatinine, Ser: 1.27 mg/dL — ABNORMAL HIGH (ref 0.44–1.00)
GFR, Estimated: 45 mL/min — ABNORMAL LOW (ref >60)
Glucose, Bld: 127 mg/dL — ABNORMAL HIGH (ref 70–99)
Potassium: 4.6 mmol/L (ref 3.5–5.1)
Sodium: 128 mmol/L — ABNORMAL LOW (ref 135–145)
Total Bilirubin: 0.2 mg/dL — ABNORMAL LOW (ref 0.3–1.2)
Total Protein: 6.4 g/dL — ABNORMAL LOW (ref 6.5–8.1)

## 2021-04-06 LAB — URINALYSIS, ROUTINE W REFLEX MICROSCOPIC
Bilirubin Urine: NEGATIVE
Glucose, UA: NEGATIVE mg/dL
Hgb urine dipstick: NEGATIVE
Ketones, ur: NEGATIVE mg/dL
Leukocytes,Ua: NEGATIVE
Nitrite: NEGATIVE
Protein, ur: NEGATIVE mg/dL
Specific Gravity, Urine: <1.005 — ABNORMAL LOW (ref 1.005–1.030)
pH: 6 (ref 5.0–8.0)

## 2021-04-06 LAB — CBC WITH DIFFERENTIAL/PLATELET
Abs Immature Granulocytes: 0.82 10*3/uL — ABNORMAL HIGH (ref 0.00–0.07)
Basophils Absolute: 0 10*3/uL (ref 0.0–0.1)
Basophils Relative: 0 %
Eosinophils Absolute: 0 10*3/uL (ref 0.0–0.5)
Eosinophils Relative: 0 %
HCT: 25.3 % — ABNORMAL LOW (ref 36.0–46.0)
Hemoglobin: 8 g/dL — ABNORMAL LOW (ref 12.0–15.0)
Immature Granulocytes: 4 %
Lymphocytes Relative: 1 %
Lymphs Abs: 0.3 10*3/uL — ABNORMAL LOW (ref 0.7–4.0)
MCH: 30.7 pg (ref 26.0–34.0)
MCHC: 31.6 g/dL (ref 30.0–36.0)
MCV: 96.9 fL (ref 80.0–100.0)
Monocytes Absolute: 0.9 10*3/uL (ref 0.1–1.0)
Monocytes Relative: 4 %
Neutro Abs: 20.8 10*3/uL — ABNORMAL HIGH (ref 1.7–7.7)
Neutrophils Relative %: 91 %
Platelets: 364 10*3/uL (ref 150–400)
RBC: 2.61 MIL/uL — ABNORMAL LOW (ref 3.87–5.11)
RDW: 19.1 % — ABNORMAL HIGH (ref 11.5–15.5)
WBC: 22.8 10*3/uL — ABNORMAL HIGH (ref 4.0–10.5)
nRBC: 0.1 % (ref 0.0–0.2)

## 2021-04-06 LAB — MAGNESIUM: Magnesium: 2 mg/dL (ref 1.7–2.4)

## 2021-04-06 LAB — RESP PANEL BY RT-PCR (FLU A&B, COVID) ARPGX2
Influenza A by PCR: NEGATIVE
Influenza B by PCR: NEGATIVE
SARS Coronavirus 2 by RT PCR: NEGATIVE

## 2021-04-06 LAB — LACTIC ACID, PLASMA: Lactic Acid, Venous: 1.1 mmol/L (ref 0.5–1.9)

## 2021-04-06 LAB — APTT: aPTT: 39 seconds — ABNORMAL HIGH (ref 24–36)

## 2021-04-06 MED ORDER — LACTATED RINGERS IV BOLUS
1000.0000 mL | Freq: Once | INTRAVENOUS | Status: AC
  Administered 2021-04-06: 1000 mL via INTRAVENOUS

## 2021-04-06 MED ORDER — VANCOMYCIN HCL IN DEXTROSE 1-5 GM/200ML-% IV SOLN
1000.0000 mg | Freq: Once | INTRAVENOUS | Status: AC
  Administered 2021-04-06: 1000 mg via INTRAVENOUS
  Filled 2021-04-06: qty 200

## 2021-04-06 MED ORDER — SODIUM CHLORIDE 0.9 % IV SOLN
2.0000 g | Freq: Once | INTRAVENOUS | Status: AC
  Administered 2021-04-06: 2 g via INTRAVENOUS
  Filled 2021-04-06: qty 2

## 2021-04-06 MED ORDER — SODIUM CHLORIDE 0.9 % IV SOLN: INTRAVENOUS | Status: DC | PRN

## 2021-04-06 NOTE — Telephone Encounter (Signed)
FYI  [name] [name] [phone]  Al called to say that Ilhan was not making any sense this morning, asking for her mother. She was a little better now than she was earlier this morning. He was asking if he could bring her to get some labs, that maybe her hemoglobin was low again. I et him know it was 12:58 and we were closing for lunch, so he said it might be better if I take her to [place] so they can get labs back in an hour or less. So he said he was going to do that.

## 2021-04-06 NOTE — ED Triage Notes (Addendum)
Per pt and husband pt with confusion started ~5am-pt states "I feel fine now"-pt assisted from w/c to stretcher by husband and staff-pt NAD-husband states when pt presents with confusion it has been r/t to low hgb-husband states pt is being treated for wounds to bilat LE

## 2021-04-06 NOTE — ED Notes (Signed)
Xray at bedside

## 2021-04-06 NOTE — ED Notes (Signed)
[facility] at beside

## 2021-04-06 NOTE — ED Notes (Signed)
Blood work drawn with PIV in lab on hold

## 2021-04-06 NOTE — H&P (Addendum)
History and Physical    [name] [name] JQB:[id number] DOB: 1951/10/09 DOA: 04/12/2021  PCP: [name], MD  Patient coming from: [facility] ED  I have personally briefly reviewed patient's old medical records in [place]  Chief Complaint: AMS  HPI: [name] [name] is a 70 y.o. 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   Pt back to baseline on my evaluation. States she was confused and reportedly per husband who is not currently at bedside pt was disoriented and talking about deceased mother this morning.  Pt has chronic leg ulcer that she thinks is no worse than usual. She followed with wound clinic in [place] in the past but recently saw wound care at [id number] in March for second opinion.  Per documentation, patient had wound culture of the right leg in January showing high amounts of Enterococcus faecalis and Pseudomonas and was referred to ID. Initially placed on IV Cefepime but discontinued due to lack of response. Pt has chronic colonization with Pseudomonas. Has seen plastic surgery and had debridement with wound matrix placed in February that was unsuccessful.  She has been referred to orthopedic given poor prognosis and likely need for amputation.  Patient reportedly has not been interested in amputation.  ED Course: She was afebrile normotensive and on her baseline 2 to 3 L.  Had WBC of 22.8, lactate 2.4, hemoglobin of 8, sodium of 128, creatinine of 1.27.  Negative UA, negative Covid PCR.  Negative bilateral tibia/fibula  x-ray.  Negative bilateral ankle x-ray.  Left foot x-ray concerning for fourth and fifth digit osteomyelitis.  Review of Systems: Constitutional: No Weight Change, No Fever ENT/Mouth: No sore throat, No Rhinorrhea Eyes: No Eye Pain, No Vision Changes Cardiovascular: No Chest Pain, no SOB,no Edema Respiratory: No Cough, No Sputum Gastrointestinal: No Nausea, No Vomiting, No Diarrhea, No Constipation, No Pain Genitourinary: No Urgency, No Flank Pain Musculoskeletal: No Arthralgias, No Myalgias Skin: + Skin Lesions, No Pruritus, Neuro: no Weakness, + Numbness Psych: No Anxiety/Panic, No Depression, no decrease appetite Heme/Lymph: No Bruising, No Bleeding Past Medical History:  Diagnosis Date  . Anemia   . Arthritis   . CHF (congestive heart failure) ([place])   . DOE (dyspnea on exertion)   . Epistaxis   . History of chicken pox   . Hypertension   . Hypothyroidism   . Leg ulcer ([place])   . Melanoma ([place]) 1999  . 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[place])   . Non Hodgkin's lymphoma ([place])    Treated with chemo 2010, felt to be cured.  Marland Kitchen PAH (pulmonary artery hypertension) ([place])   . Peripheral arterial occlusive disease ([place])   . Pneumonia 2008  . Pulmonary hypertension ([place])   . Requires supplemental oxygen    2 liters-3liters when moving  . Sclerodermia generalized [facility])     Past Surgical History:  Procedure Laterality Date  . ABDOMINAL AORTOGRAM W/LOWER EXTREMITY Bilateral 10/04/2020   Procedure: ABDOMINAL AORTOGRAM W/LOWER EXTREMITY;  Surgeon: Serafina Mitchell, MD;  Location: [place] CV LAB;  Service: Cardiovascular;  Laterality: Bilateral;  . ABDOMINAL AORTOGRAM W/LOWER  EXTREMITY Bilateral 01/02/2021   Procedure: ABDOMINAL AORTOGRAM W/LOWER EXTREMITY;  Surgeon: Waynetta Sandy, MD;  Location: [place] CV LAB;  Service: Cardiovascular;  Laterality: Bilateral;  . AMPUTATION Right 10/24/2017   Procedure: RAY resection right index;  Surgeon: Daryll Brod, MD;  Location: [place];   Service: Orthopedics;  Laterality: Right;  Axillary block  . AMPUTATION Right 11/21/2018   Procedure: AMPUTATION RAY RIGHT MIDDLE FINGER;  Surgeon: Daryll Brod, MD;  Location: [place];  Service: Orthopedics;  Laterality: Right;  . AMPUTATION Right 10/22/2019   Procedure: AMPUTATION DIGIT RIGHT RING AND SMALL FINGER, DIGITAL SYMPATHECTOMY RIGHT RADIAL ULNAR ARCH;  Surgeon: Daryll Brod, MD;  Location: [place];  Service: Orthopedics;  Laterality: Right;  AXILARY  . apligraph  12-04-2102,   12-18-2012   bilateral calves   [name]  . APPLICATION OF A-CELL OF EXTREMITY Right 02/09/2021   Procedure: application of primatrix mesh;  Surgeon: Cindra Presume, MD;  Location: [place];  Service: Plastics;  Laterality: Right;  . biopsy on cerebellum    . COLONOSCOPY WITH PROPOFOL Left 10/07/2015   Procedure: COLONOSCOPY WITH PROPOFOL;  Surgeon: [name], MD;  Location: [place];  Service: Endoscopy;  Laterality: Left;  . ENDARTERECTOMY FEMORAL Right 09/14/2016   Procedure: ENDARTERECTOMY FEMORAL RIGHT;  Surgeon: [name], MD;  Location: [facility] OR;  Service: Vascular;  Laterality: Right;  . ENDOVENOUS ABLATION SAPHENOUS VEIN W/ LASER  07-24-2012   right greater saphenous vein by [name], M.D.   . ENDOVENOUS ABLATION SAPHENOUS VEIN W/ LASER  10-16-2012   left greater saphenous vein  by Dr. [name]  . ENDOVENOUS ABLATION SAPHENOUS VEIN W/ LASER Right 09-01-2015   endovenous laser ablation right greater saphenous vein by [name] MD  . ESOPHAGOGASTRODUODENOSCOPY N/A 05/07/2016   Procedure: ESOPHAGOGASTRODUODENOSCOPY (EGD);  Surgeon: [name], MD;  Location: [facility] ENDOSCOPY;  Service: Endoscopy;  Laterality: N/A;  . ESOPHAGOGASTRODUODENOSCOPY N/A 11/23/2020   Procedure: ESOPHAGOGASTRODUODENOSCOPY (EGD);  Surgeon: [name], MD;  Location: [facility];  Service: Endoscopy;  Laterality: N/A;  . ESOPHAGOGASTRODUODENOSCOPY (EGD) WITH PROPOFOL Left 10/07/2015    Procedure: ESOPHAGOGASTRODUODENOSCOPY (EGD) WITH PROPOFOL;  Surgeon: [name], MD;  Location: [place];  Service: Endoscopy;  Laterality: Left;  . GIVENS CAPSULE STUDY N/A 07/31/2016   Procedure: GIVENS CAPSULE STUDY;  Surgeon: [name], MD;  Location: [facility] ENDOSCOPY;  Service: Endoscopy;  Laterality: N/A;  . GIVENS CAPSULE STUDY N/A 11/23/2020   Procedure: GIVENS CAPSULE STUDY;  Surgeon: [name], MD;  Location: [facility] ENDOSCOPY;  Service: Endoscopy;  Laterality: N/A;  . I & D EXTREMITY Right 02/09/2021   Procedure: Debridement of right lower extremity wound;  Surgeon: Cindra Presume, MD;  Location: [place];  Service: Plastics;  Laterality: Right;  1 hour, please  . IR IVC FILTER PLMT / S&I /IMG GUID/MOD SED  11/22/2020  . LOWER EXTREMITY ANGIOGRAPHY N/A 10/25/2020   Procedure: LOWER EXTREMITY ANGIOGRAPHY - Left;  Surgeon: Serafina Mitchell, MD;  Location: [place] [facility] LAB;  Service: Cardiovascular;  Laterality: N/A;  . melonoma removes Right    behind right knee  . OPEN REDUCTION INTERNAL FIXATION (ORIF) DISTAL RADIAL FRACTURE Left 11/24/2019   Procedure: OPEN REDUCTION INTERNAL FIXATION (ORIF) DISTAL RADIAL FRACTURE;  Surgeon: Altamese Glennallen, MD;  Location: [place];  Service: Orthopedics;  Laterality: Left;  . PATCH ANGIOPLASTY Right 09/14/2016   Procedure: PATCH ANGIOPLASTY RIGHT FEMORAL ARTERY USING HEMASHIELD PLATINUM FINESSE PATCH;  Surgeon: [name], MD;  Location: [facility] OR;  Service: Vascular;  Laterality: Right;  . PERIPHERAL VASCULAR BALLOON ANGIOPLASTY Right 10/04/2020   Procedure: PERIPHERAL VASCULAR BALLOON ANGIOPLASTY;  Surgeon: Serafina Mitchell, MD;  Location: [place] [facility];  Service: Cardiovascular;  Laterality: Right;  SFA/PT/AT/PTtrunk  . PERIPHERAL VASCULAR BALLOON ANGIOPLASTY Left 10/25/2020   Procedure: PERIPHERAL VASCULAR BALLOON ANGIOPLASTY;  Surgeon: Serafina Mitchell, MD;  Location: [place] [facility];  Service: Cardiovascular;  Laterality: Left;  anterior tibial  and tibioperoneal trunk  . PERIPHERAL VASCULAR CATHETERIZATION N/A 09/07/2016   Procedure: Abdominal Aortogram w/Lower Extremity;  Surgeon: [name], MD;  Location: [place] [facility];  Service: Cardiovascular;  Laterality: N/A;  . RIGHT HEART CATH N/A 04/19/2017   Procedure: Right Heart Cath;  Surgeon: [name], MD;  Location: [place] [facility];  Service: Cardiovascular;  Laterality: N/A;  . RIGHT HEART CATHETERIZATION N/A 11/14/2012   Procedure: RIGHT HEART CATH;  Surgeon: [name], MD;  Location: [facility] CATH LAB;  Service: Cardiovascular;  Laterality: N/A;  . rt little toe removal  10/1998  . stent left thigh  3/11  . TENDON TRANSFER Right 10/24/2017   Procedure: transfer extensor indecus propius/extensor digitorum commomus index to middle ring and small;  Surgeon: Daryll Brod, MD;  Location: [place];  Service: Orthopedics;  Laterality: Right;  . TONSILLECTOMY    . TONSILLECTOMY AND ADENOIDECTOMY    . TUNNELED VENOUS CATHETER PLACEMENT  01/2013  . ULNAR HEAD EXCISION Right 10/24/2017   Procedure: darrach right ulna;  Surgeon: Daryll Brod, MD;  Location: [place];  Service: Orthopedics;  Laterality: Right;  . VEIN LIGATION AND STRIPPING  05/16/2012   Procedure: VEIN LIGATION AND STRIPPING;  Surgeon: [name], MD;  Location: [facility] OR;  Service: Vascular;  Laterality: Right;  Irrigation and Debridement right lower leg, ligation of saphenous vein.     reports that she has never smoked. She has never used smokeless tobacco. She reports current alcohol use of about 7.0 standard drinks of alcohol per week. She reports that she does not use drugs. Social History  Allergies  Allergen Reactions  . Levofloxacin Other (See Comments)    FLU LIKE SYMPTOM   . Sulfa Antibiotics Rash and Other (See Comments)    Face peeled, Severe rash with significant peeling of face. No airway involvement per patient   . Doxycycline Hyclate Swelling    SWELLING REACTION UNSPECIFIED ; "TABLETS ONLY  - CAPSULES ARE TOLERATED FINE"  . Penicillins Rash    Has patient had a PCN reaction causing immediate rash, facial/tongue/throat swelling, SOB or lightheadedness with hypotension: no Has patient had a PCN reaction causing severe rash involving mucus membranes or skin necrosis: no Has patient had a PCN reaction that required hospitalization: no Has patient had a PCN reaction occurring within the last 10 years: {no If all of the above answers are "NO", then may proceed with Cephalosporin use.    Family History  Problem Relation Age of Onset  . Lupus Father   . Lymphoma Father   . Hyperlipidemia Mother   . Cancer Daughter        sarcoma  . Colon cancer Other      Prior to Admission medications   Medication Sig Start Date End Date Taking? Authorizing Provider  OXYGEN Inhale 2-3 L into the lungs continuous.    Yes [provider]  acetaminophen (TYLENOL) 650 MG CR tablet Take 1,300 mg by mouth every 8 (eight) hours as needed for pain.  [provider]  Biotin 5000 MCG CAPS Take 5,000 mcg by mouth daily.    [provider]  Calcium Carbonate-Vit D-Min (CALCIUM 1200 PO) Take 1 tablet by mouth at bedtime.    [provider]  Cholecalciferol (VITAMIN D) 2000 units CAPS Take 2,000 Units by mouth at bedtime.    [provider]  citalopram (CELEXA) 20 MG tablet Take 1 tablet (20 mg total) by mouth at bedtime. 02/10/21   Regalado, Belkys A, MD  citalopram (CELEXA) 20 MG tablet TAKE 1 TABLET (20 MG TOTAL) BY MOUTH AT BEDTIME. 02/10/21 02/10/22  Regalado, Belkys A, MD  clopidogrel (PLAVIX) 75 MG tablet TAKE 1 TABLET BY MOUTH EVERY DAY 01/05/21   Bensimhon, Shaune Pascal, MD  cycloSPORINE (RESTASIS) 0.05 % ophthalmic emulsion Place 2 drops into both eyes 2 (two) times daily.    [provider]  Ensure Max Protein (ENSURE MAX PROTEIN) LIQD Take 330 mLs (11 oz total) by mouth at bedtime. 02/10/21   Regalado, Belkys A, MD  FERREX 150 150 MG capsule TAKE 1  CAPSULE BY MOUTH TWICE A DAY 01/05/21   [name], MD  fish oil-omega-3 fatty acids 1000 MG capsule Take 1,000 mg by mouth 2 (two) times daily.    [provider]  folic acid (FOLVITE) 1 MG tablet TAKE 1 TABLET BY MOUTH EVERY DAY 01/05/21   [name], MD  gabapentin (NEURONTIN) 100 MG capsule Take 100 mg by mouth 4 (four) times daily. 03/01/21   [provider]  HYDROcodone-acetaminophen (NORCO) 5-325 MG tablet Take 1 tablet by mouth as needed for severe pain. (Do not exceed 1 tablet every 8 hours) 02/14/21   Young, Franz Dell, PA-C  levothyroxine (SYNTHROID) 50 MCG tablet TAKE 1 TABLET BY MOUTH DAILY BEFORE BREAKFAST 03/05/21   [name], MD  loperamide (IMODIUM) 2 MG capsule Take 2-4 mg by mouth 4 (four) times daily as needed for diarrhea or loose stools.    [provider]  MAGNESIUM ASPARTATE PO Take 150 mg by mouth in the morning and at bedtime.    [provider]  Multiple Vitamin (MULTIVITAMIN WITH MINERALS) TABS tablet Take 1 tablet by mouth daily at 12 noon.    [provider]  nutrition supplement, JUVEN, (JUVEN) PACK Take 1 packet by mouth 2 (two) times daily between meals. 02/10/21   Regalado, Cassie Freer, MD  nutrition supplement, JUVEN, (JUVEN) PACK TAKE 1 PACKET BY MOUTH 2 (TWO) TIMES DAILY BETWEEN MEALS. 02/10/21 02/10/22  Regalado, Jerald Kief A, MD  Nutritional Supplements (ENSURE HIGH PROTEIN) LIQD TAKE 330 MLS (11 OZ TOTAL) BY MOUTH AT BEDTIME. 02/10/21 02/10/22  Regalado, Belkys A, MD  ondansetron (ZOFRAN) 4 MG tablet TAKE 1 TABLET BY MOUTH EVERY 8 HOURS AS NEEDED FOR NAUSEA AND VOMITING 03/05/21   [name], MD  OPSUMIT 10 MG tablet Take 1 tablet (51m) by mouth daily. 04/29/20   Bensimhon, DShaune Pascal MD  pantoprazole (PROTONIX) 40 MG tablet Take 1 tablet (40 mg total) by mouth 2 (two) times daily. 10/05/20   BElby Showers MD  potassium chloride 20 MEQ/15ML (10%) SOLN TAKE 2 TEASPOONFUL (DILUTED IN WATER) ONCE DAILY 08/02/20   BElby Showers  MD  predniSONE (DELTASONE) 5 MG tablet Take 5 mg by mouth daily. 04/04/20   [provider]  rosuvastatin (CRESTOR) 5 MG tablet Take 1 tablet every Monday, Wednesday, and Friday. 08/29/20   Bensimhon, DShaune Pascal MD  sildenafil (REVATIO) 20 MG tablet Take 3 tablets (60  mg total) by mouth 3 (three) times daily. Patient not taking: Reported on 04/22/2021 02/02/21   Bensimhon, Shaune Pascal, MD  torsemide (DEMADEX) 20 MG tablet TAKE 2 TABLETS BY MOUTH IN THE AM AND 1 TABLET BY MOUTH IN THE PM. 03/07/21   Bensimhon, Shaune Pascal, MD  valACYclovir (VALTREX) 500 MG tablet Take 500 mg by mouth as needed.    [provider]  vitamin B-12 (CYANOCOBALAMIN) 1000 MCG tablet Take 1,000 mcg by mouth daily.    [provider]    Physical Exam: Vitals:   04/25/2021 1815 04/07/2021 1900 04/23/2021 2000 04/21/2021 2300  BP: (!) 126/52  (!) 154/60 (!) 140/52  Pulse: 69   94  Resp: 17  18 (!) 21  Temp:   98.6 F (37 C) 98.6 F (37 C)  TempSrc:   Oral Oral  SpO2: 94% 94% 96% (!) 80%  Weight:      Height:        Constitutional: NAD, thin frail chronically ill-appearing female laying in bed Vitals:   04/14/2021 1815 04/28/2021 1900 04/21/2021 2000 04/28/2021 2300  BP: (!) 126/52  (!) 154/60 (!) 140/52  Pulse: 69   94  Resp: 17  18 (!) 21  Temp:   98.6 F (37 C) 98.6 F (37 C)  TempSrc:   Oral Oral  SpO2: 94% 94% 96% (!) 80%  Weight:      Height:       Eyes: PERRL, lids and conjunctivae normal ENMT: Mucous membranes are moist.  Neck: normal, supple Respiratory: clear to auscultation bilaterally, no wheezing, no crackles. Normal respiratory effort on 3L of O2 via . No accessory muscle use.  Cardiovascular: Regular rate and rhythm, no murmurs / rubs / gallops. No extremity edema.  Abdomen: no tenderness, no masses palpated.  Bowel sounds positive.  Musculoskeletal: no clubbing / cyanosis.  Multiple digit amputation of the right hand and right foot,contractures of upper extremity.   Skin: LE wound not  directly visualized due to wound dressing.  See photo below                Neurologic: CN 2-12 grossly intact. Sensation intact, DTR normal. Strength 5/5 in all 4.  Psychiatric: Normal judgment and insight. Alert and oriented x 3. Normal mood.     Labs on Admission: I have personally reviewed following labs and imaging studies  CBC: Recent Labs  Lab 04/09/2021 1539  WBC 22.8*  NEUTROABS 20.8*  HGB 8.0*  HCT 25.3*  MCV 96.9  PLT 409   Basic Metabolic Panel: Recent Labs  Lab 04/13/2021 1539 04/14/2021 1623  NA 128*  --   K 4.6  --   CL 92*  --   CO2 25  --   GLUCOSE 127*  --   BUN 49*  --   CREATININE 1.27*  --   CALCIUM 8.5*  --   MG  --  2.0   GFR: Estimated Creatinine Clearance: 34.6 mL/min (A) (by C-G formula based on SCr of 1.27 mg/dL (H)). Liver Function Tests: Recent Labs  Lab 04/28/2021 1539  AST 26  ALT 17  ALKPHOS 119  BILITOT 0.2*  PROT 6.4*  ALBUMIN 2.6*   No results for input(s): LIPASE, AMYLASE in the last 168 hours. No results for input(s): AMMONIA in the last 168 hours. Coagulation Profile: Recent Labs  Lab 04/07/2021 1539  INR 0.9   Cardiac Enzymes: No results for input(s): CKTOTAL, CKMB, CKMBINDEX, TROPONINI in the last 168 hours. BNP (last 3 results)  Recent Labs    11/21/20 1439  PROBNP 526.0*   HbA1C: No results for input(s): HGBA1C in the last 72 hours. CBG: No results for input(s): GLUCAP in the last 168 hours. Lipid Profile: No results for input(s): CHOL, HDL, LDLCALC, TRIG, CHOLHDL, LDLDIRECT in the last 72 hours. Thyroid Function Tests: No results for input(s): TSH, T4TOTAL, FREET4, T3FREE, THYROIDAB in the last 72 hours. Anemia Panel: No results for input(s): VITAMINB12, FOLATE, FERRITIN, TIBC, IRON, RETICCTPCT in the last 72 hours. Urine analysis:    Component Value Date/Time   COLORURINE YELLOW 04/26/2021 1626   APPEARANCEUR CLEAR 04/09/2021 1626   LABSPEC <1.005 (L) 04/29/2021 1626   PHURINE 6.0 04/18/2021  1626   GLUCOSEU NEGATIVE 04/15/2021 1626   HGBUR NEGATIVE 04/18/2021 1626   BILIRUBINUR NEGATIVE 04/09/2021 1626   BILIRUBINUR NEG 02/27/2021 1226   KETONESUR NEGATIVE 04/04/2021 1626   PROTEINUR NEGATIVE 04/21/2021 1626   UROBILINOGEN 0.2 02/27/2021 1226   NITRITE NEGATIVE 04/21/2021 1626   LEUKOCYTESUR NEGATIVE 04/07/2021 1626    Radiological Exams on Admission: DG Tibia/Fibula Left  Result Date: 04/03/2021 CLINICAL DATA:  Wounds EXAM: LEFT TIBIA AND FIBULA - 2 VIEW COMPARISON:  None. FINDINGS: Diffuse vascular calcifications. No acute bony abnormality. Specifically, no fracture, subluxation, or dislocation. No bone destruction to suggest osteomyelitis. IMPRESSION: No acute bony abnormality. Electronically Signed   By: Rolm Baptise M.D.   On: 04/09/2021 19:54   DG Tibia/Fibula Right  Result Date: 04/13/2021 CLINICAL DATA:  Wounds EXAM: RIGHT TIBIA AND FIBULA - 2 VIEW COMPARISON:  None. FINDINGS: Dilated calcified vascular structure, likely calcified varicose veins. Arterial calcifications. No acute bony abnormality. Specifically, no fracture, subluxation, or dislocation. No bone destruction. IMPRESSION: No acute bony abnormality. Electronically Signed   By: Rolm Baptise M.D.   On: 04/16/2021 19:57   DG Ankle Complete Left  Result Date: 04/09/2021 CLINICAL DATA:  Wounds EXAM: LEFT ANKLE COMPLETE - 3+ VIEW COMPARISON:  None. FINDINGS: Vascular calcifications. No acute bony abnormality. Specifically, no fracture, subluxation, or dislocation. No bone destruction. Soft tissues are intact. IMPRESSION: No acute bony abnormality. Electronically Signed   By: Rolm Baptise M.D.   On: 04/08/2021 19:55   DG Ankle Complete Right  Result Date: 04/08/2021 CLINICAL DATA:  Wounds EXAM: RIGHT ANKLE - COMPLETE 3+ VIEW COMPARISON:  None. FINDINGS: Diffuse vascular calcifications. Diffuse osteopenia. No acute bony abnormality. Specifically, no fracture, subluxation, or dislocation. No bone destruction.  IMPRESSION: No acute bony abnormality. Electronically Signed   By: Rolm Baptise M.D.   On: 04/21/2021 19:59   CT Head Wo Contrast  Result Date: 04/16/2021 CLINICAL DATA:  70 year old female with altered mental status. EXAM: CT HEAD WITHOUT CONTRAST TECHNIQUE: Contiguous axial images were obtained from the base of the skull through the vertex without intravenous contrast. COMPARISON:  Head CT dated 02/05/2021. FINDINGS: Brain:Mild age-related atrophy and moderate chronic microvascular ischemic changes. Postsurgical changes of the right cerebellar lobe with an area of encephalomalacia. There is no acute intracranial hemorrhage. No mass effect or midline shift. No extra-axial fluid collection. Vascular: No hyperdense vessel or unexpected calcification. Skull: No acute calvarial pathology. Prior right occipital craniectomy. Sinuses/Orbits: Mild mucoperiosteal thickening of paranasal sinuses. There is perforation of the nasal septum. No air-fluid level. Left mastoid effusions. The right mastoid air cells are clear. Other: None IMPRESSION: 1. No acute intracranial pathology. 2. Age-related atrophy and chronic microvascular ischemic changes. 3. Postsurgical changes of right cerebellar lobe with an area of encephalomalacia. 4. Left mastoid effusions. Electronically Signed   By:  Anner Crete M.D.   On: 04/09/2021 16:33   DG Chest Port 1 View  Result Date: 04/19/2021 CLINICAL DATA:  70 year old female with sepsis. EXAM: PORTABLE CHEST 1 VIEW COMPARISON:  Chest radiograph dated 02/08/2021. FINDINGS: There is diffuse chronic interstitial coarsening and bronchitic changes. No focal consolidation, pleural effusion, or pneumothorax. There is cardiomegaly. Bilateral hilar prominence, likely enlarged pulmonary arteries and suggestive of pulmonary hypertension. Atherosclerotic calcification of the aortic arch. No acute osseous pathology. IMPRESSION: 1. No acute cardiopulmonary process. 2. Cardiomegaly. Electronically Signed    By: Anner Crete M.D.   On: 04/29/2021 16:35   DG Foot Complete Left  Result Date: 04/19/2021 CLINICAL DATA:  Wounds EXAM: LEFT FOOT - COMPLETE 3+ VIEW COMPARISON:  None. FINDINGS: Hallux valgus deformity. Diffuse osteopenia. No acute bony abnormality. Specifically, no fracture, subluxation, or dislocation. There appears to be bone destruction in the distal phalanx of the 4th and possibly 5th toes, age indeterminate. IMPRESSION: Apparent bone destruction in the distal phalanges of the left 4th and 5th toes, question chronicity. Cannot exclude acute or chronic osteomyelitis. Electronically Signed   By: Rolm Baptise M.D.   On: 04/24/2021 19:56   DG Foot Complete Right  Result Date: 04/13/2021 CLINICAL DATA:  Wounds EXAM: RIGHT FOOT COMPLETE - 3+ VIEW COMPARISON:  None. 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 No bone destruction seen to suggest acute osteomyelitis. Diffuse osteopenia. Hallux valgus deformity. No acute bony abnormality. Specifically, no fracture, subluxation, or dislocation. IMPRESSION: Chronic changes as above. No acute bony abnormality. Electronically Signed   By: Rolm Baptise M.D.   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  Need ID consult in the morning to help with antibiotic regimen Patient has failed treatment with wound care, plastic surgery and has already been optimized for her PAD.  Numerous specialists have recommended orthopedic referral for amputation.  Patient does not want amputation.  Likely osteomyelitis of the  left 4 and 5th digit obtain MRI of left foot   Lactic acidosis Continue to trend lactate Continuous IV fluid  AKI on CKD stage 3 improving with fluids continue to monitor with repeat lab  Anemia of chronic disease/iron deficiency has hx of GI bleed nursing noted dark stool although pt also on iron supplementation-obtain FOBT  Hyponatremia Improving with fluids.  Continue to follow.  Interstitial lung disease with chronic hypoxemia Continue baseline O2 of 2 to 3 L Follows with outpatient pulmonology  Pulmonary hypertension continue on Revatio and macitentan continue Torsemide   Scleroderma Continue chronic prednisone  PAD continue Plavix   Paroxysmal atrial fibrillation Not on anticoagulation due to previous GI bleed  History of DVT Has IVC filter in place  Hyperlipidemia Off statin due to myalgia  DVT prophylaxis:none due to possible need for debridement for potential osteomyelitis pending MRI, unable to receive SCD due to bilateral chronic lower leg wounds code Status: Full Family Communication: Plan discussed with patient at bedside  disposition Plan: Home with at least 2 midnight stays  Consults called:  Admission status: inpatient   Level of  care: Med-Surg  Status is: Inpatient  Remains inpatient appropriate because:Inpatient level of care appropriate due to severity of illness   Dispo: The patient is from: Home              Anticipated d/c is to: Home              Patient currently is not medically stable to d/c.   Difficult to place patient No         Orene Desanctis DO [facility]   If 7PM-7AM, please contact night-coverage [email or web address]   04/01/2021, 11:48 PM

## 2021-04-06 NOTE — ED Notes (Signed)
Handoff report given to [facility].  Report given to [facility] long Surveyor, quantity

## 2021-04-06 NOTE — ED Provider Notes (Signed)
[facility] EMERGENCY DEPARTMENT Provider Note   CSN: [id number] Arrival date & time: 04/23/2021  1448     History Chief Complaint  Patient presents with  . Altered Mental Status    [name] [name] is a 70 y.o. female with hx of severe scleroderma who presents the emergency department after episode at home this morning where she was altered in her mental status.  According to her husband she was talking about her mother asking where she was but her mother has been deceased for several years additionally she was looking for family members who live out of state and was extremely disoriented.  Patient states that this has happened in the past, and her husband offers insight that this was occurred when she was extremely dehydrated and her electrolytes were abnormal.  Patient undergoes wound management with [facility] for severe scleroderma in her lower extremities.  Patient has lost most of the digits on her right hand secondary to scleroderma and now has bacterial infections in the toes of her feet secondary to this disease.  She denies any fevers or chills at home, appears to be at her baseline mentally at this time.  I personally reviewed this patient's medical record.  She is history of generalized scleroderma, pulmonary hypertension on 2 L of supplemental oxygen via [place] at baseline, non-Hodgkin's lymphoma hypothyroidism, history of melanoma, congestive heart failure.  Patient is anticoagulated on clopidogrel   HPI     Past Medical History:  Diagnosis Date  . Anemia   . Arthritis   . CHF (congestive heart failure) ([place])   . DOE (dyspnea on exertion)   . Epistaxis   . History of chicken pox   . Hypertension   . Hypothyroidism   . Leg ulcer ([place])   . Melanoma ([place]) 1999  . 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[place])   . Non Hodgkin's lymphoma ([place])    Treated with chemo 2010, felt to be cured.  Marland Kitchen PAH (pulmonary artery hypertension) ([place])   . Peripheral arterial occlusive disease  ([place])   . Pneumonia 2008  . Pulmonary hypertension ([place])   . Requires supplemental oxygen    2 liters-3liters when moving  . Sclerodermia generalized [facility])     Patient Active Problem List   Diagnosis Date Noted  . Cellulitis, leg 04/23/2021  . Open wound   . PAF (paroxysmal atrial fibrillation) ([place])   . AKI (acute kidney injury) ([place]) 02/07/2021  . Macrocytic anemia 02/07/2021  . Thrombocytosis 02/07/2021  . PAH (pulmonary artery hypertension) ([place]) 02/07/2021  . Chronic diastolic CHF (congestive heart failure) ([place]) 02/07/2021  . Non-healing wound of right lower extremity 02/07/2021  . Physical deconditioning 02/07/2021  . CREST syndrome ([place])   . Iron deficiency anemia due to chronic blood loss 01/26/2021  . Anemia due to chronic blood loss 01/18/2021  . Anxiety state 01/18/2021  . 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 Disorder of calcium metabolism 01/18/2021  . Gastro-esophageal reflux disease with esophagitis, without bleeding 01/18/2021  . Oxygen dependent 01/18/2021  . Ulcer of esophagus with bleeding 01/18/2021  . Gangrene of finger of right hand ([place]) 11/29/2020  . Malnutrition of moderate degree 11/22/2020  . GI bleed 11/21/2020  . Eustachian tube dysfunction, bilateral 07/29/2020  . 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 Wrist fracture 11/24/2019  . Multiple closed stable fractures of pubic ramus ([name]) 11/23/2019  . Distal radius fracture, left 11/23/2019  . Fall at home, initial encounter 11/23/2019  . AMS (altered  mental status) 11/12/2019  . Altered mental status 11/11/2019  . Hypomagnesemia 11/11/2019  . Osteoporosis 01/22/2019  . Hypothyroidism 06/16/2018  . Pulmonary hypertension associated with systemic disorder ([place]) 10/08/2017  . 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 Dry gangrene ([place]) 09/11/2017  . Medication reaction 09/11/2017  . ILD (interstitial lung disease) ([place]) 07/22/2017  . Nasal septal  perforation 07/10/2017  . Chronic respiratory failure with hypoxia ([place]) 10/31/2016  . Atherosclerosis of native arteries of right leg with ulceration of unspecified site ([place]) 09/14/2016  . Prolonged Q-T interval on ECG 07/29/2016  . CKD (chronic kidney disease), stage III ([place]) 07/29/2016  . Scleroderma ([place]) 05/06/2016  . Depression 04/24/2016  . Extensor tenosynovitis of wrist, right 02/24/2016  . 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 Hypokalemia 10/07/2015  . 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[place]) 09/01/2015  . Allergic rhinitis 03/08/2015  . High risk medication use 04/09/2013  . Venous hypertension, chronic, with ulcer and inflammation ([place]) 01/29/2013  . Lymphoma, non-Hodgkin's ([place]) 01/27/2013  . Right heart failure ([place]) 01/27/2013  . Varicose veins of lower extremities with ulcer and inflammation ([place]) 07/24/2012  . 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[place]) 04/08/2012  . Pulmonary hypertension associated with systemic disorder ([place]) 03/25/2012  . Peripheral arterial occlusive disease ([place]) 08/10/2011  . Essential hypertension 08/10/2011  . Hyperlipidemia 08/10/2011  . Arterial embolus and thrombosis of lower extremity ([place]) 08/10/2011    Past Surgical History:  Procedure Laterality Date  . ABDOMINAL AORTOGRAM W/LOWER EXTREMITY Bilateral 10/04/2020   Procedure: ABDOMINAL AORTOGRAM W/LOWER EXTREMITY;  Surgeon: Serafina Mitchell, MD;  Location: [facility];  Service: Cardiovascular;  Laterality: Bilateral;  . ABDOMINAL AORTOGRAM W/LOWER EXTREMITY Bilateral 01/02/2021   Procedure: ABDOMINAL AORTOGRAM W/LOWER EXTREMITY;  Surgeon: Waynetta Sandy, MD;  Location: [facility];  Service: Cardiovascular;  Laterality: Bilateral;  . AMPUTATION Right 10/24/2017   Procedure: RAY resection right index;  Surgeon: Daryll Brod, MD;  Location: [facility];  Service: Orthopedics;  Laterality: Right;  Axillary  block  . AMPUTATION Right 11/21/2018   Procedure: AMPUTATION RAY RIGHT MIDDLE FINGER;  Surgeon: Daryll Brod, MD;  Location: [facility];  Service: Orthopedics;  Laterality: Right;  . AMPUTATION Right 10/22/2019   Procedure: AMPUTATION DIGIT RIGHT RING AND SMALL FINGER, DIGITAL SYMPATHECTOMY RIGHT RADIAL ULNAR ARCH;  Surgeon: Daryll Brod, MD;  Location: [facility];  Service: Orthopedics;  Laterality: Right;  AXILARY  . apligraph  12-04-2102,   12-18-2012   bilateral calves   [name]  . APPLICATION OF A-CELL OF EXTREMITY Right 02/09/2021   Procedure: application of primatrix mesh;  Surgeon: Cindra Presume, MD;  Location: [facility];  Service: Plastics;  Laterality: Right;  . biopsy on cerebellum    . COLONOSCOPY WITH PROPOFOL Left 10/07/2015   Procedure: COLONOSCOPY WITH PROPOFOL;  Surgeon: [name], MD;  Location: [facility];  Service: Endoscopy;  Laterality: Left;  . ENDARTERECTOMY FEMORAL Right 09/14/2016   Procedure: ENDARTERECTOMY FEMORAL RIGHT;  Surgeon: [name], MD;  Location: [facility] OR;  Service: Vascular;  Laterality: Right;  . ENDOVENOUS ABLATION SAPHENOUS VEIN W/ LASER  07-24-2012   right greater saphenous vein by [name], M.D.   . ENDOVENOUS ABLATION SAPHENOUS VEIN W/ LASER  10-16-2012   left greater saphenous vein  by Dr. [name]  . ENDOVENOUS ABLATION SAPHENOUS VEIN W/ LASER Right 09-01-2015   endovenous laser ablation right greater saphenous  vein by [name] MD  . ESOPHAGOGASTRODUODENOSCOPY N/A 05/07/2016   Procedure: ESOPHAGOGASTRODUODENOSCOPY (EGD);  Surgeon: [name], MD;  Location: [facility] ENDOSCOPY;  Service: Endoscopy;  Laterality: N/A;  . ESOPHAGOGASTRODUODENOSCOPY N/A 11/23/2020   Procedure: ESOPHAGOGASTRODUODENOSCOPY (EGD);  Surgeon: [name], MD;  Location: [facility];  Service: Endoscopy;  Laterality: N/A;  . ESOPHAGOGASTRODUODENOSCOPY (EGD) WITH PROPOFOL Left 10/07/2015   Procedure: ESOPHAGOGASTRODUODENOSCOPY (EGD) WITH PROPOFOL;   Surgeon: [name], MD;  Location: [facility];  Service: Endoscopy;  Laterality: Left;  . GIVENS CAPSULE STUDY N/A 07/31/2016   Procedure: GIVENS CAPSULE STUDY;  Surgeon: [name], MD;  Location: [facility] ENDOSCOPY;  Service: Endoscopy;  Laterality: N/A;  . GIVENS CAPSULE STUDY N/A 11/23/2020   Procedure: GIVENS CAPSULE STUDY;  Surgeon: [name], MD;  Location: [facility] ENDOSCOPY;  Service: Endoscopy;  Laterality: N/A;  . I & D EXTREMITY Right 02/09/2021   Procedure: Debridement of right lower extremity wound;  Surgeon: Cindra Presume, MD;  Location: [facility];  Service: Plastics;  Laterality: Right;  1 hour, please  . IR IVC FILTER PLMT / S&I /IMG GUID/MOD SED  11/22/2020  . LOWER EXTREMITY ANGIOGRAPHY N/A 10/25/2020   Procedure: LOWER EXTREMITY ANGIOGRAPHY - Left;  Surgeon: Serafina Mitchell, MD;  Location: [facility];  Service: Cardiovascular;  Laterality: N/A;  . melonoma removes Right    behind right knee  . OPEN REDUCTION INTERNAL FIXATION (ORIF) DISTAL RADIAL FRACTURE Left 11/24/2019   Procedure: OPEN REDUCTION INTERNAL FIXATION (ORIF) DISTAL RADIAL FRACTURE;  Surgeon: Altamese Webster, MD;  Location: [facility];  Service: Orthopedics;  Laterality: Left;  . PATCH ANGIOPLASTY Right 09/14/2016   Procedure: PATCH ANGIOPLASTY RIGHT FEMORAL ARTERY USING HEMASHIELD PLATINUM FINESSE PATCH;  Surgeon: [name], MD;  Location: [facility];  Service: Vascular;  Laterality: Right;  . PERIPHERAL VASCULAR BALLOON ANGIOPLASTY Right 10/04/2020   Procedure: PERIPHERAL VASCULAR BALLOON ANGIOPLASTY;  Surgeon: Serafina Mitchell, MD;  Location: [facility];  Service: Cardiovascular;  Laterality: Right;  SFA/PT/AT/PTtrunk  . PERIPHERAL VASCULAR BALLOON ANGIOPLASTY Left 10/25/2020   Procedure: PERIPHERAL VASCULAR BALLOON ANGIOPLASTY;  Surgeon: Serafina Mitchell, MD;  Location: [facility];  Service: Cardiovascular;  Laterality: Left;  anterior tibial and tibioperoneal trunk  . PERIPHERAL VASCULAR  CATHETERIZATION N/A 09/07/2016   Procedure: Abdominal Aortogram w/Lower Extremity;  Surgeon: [name], MD;  Location: [facility];  Service: Cardiovascular;  Laterality: N/A;  . RIGHT HEART CATH N/A 04/19/2017   Procedure: Right Heart Cath;  Surgeon: [name], MD;  Location: [facility];  Service: Cardiovascular;  Laterality: N/A;  . RIGHT HEART CATHETERIZATION N/A 11/14/2012   Procedure: RIGHT HEART CATH;  Surgeon: [name], MD;  Location: [facility] CATH LAB;  Service: Cardiovascular;  Laterality: N/A;  . rt little toe removal  10/1998  . stent left thigh  3/11  . TENDON TRANSFER Right 10/24/2017   Procedure: transfer extensor indecus propius/extensor digitorum commomus index to middle ring and small;  Surgeon: Daryll Brod, MD;  Location: [facility];  Service: Orthopedics;  Laterality: Right;  . TONSILLECTOMY    . TONSILLECTOMY AND ADENOIDECTOMY    . TUNNELED VENOUS CATHETER PLACEMENT  01/2013  . ULNAR HEAD EXCISION Right 10/24/2017   Procedure: darrach right ulna;  Surgeon: Daryll Brod, MD;  Location: [facility];  Service: Orthopedics;  Laterality: Right;  . VEIN LIGATION AND STRIPPING  05/16/2012   Procedure: VEIN LIGATION AND STRIPPING;  Surgeon: [name], MD;  Location: [facility]  OR;  Service: Vascular;  Laterality: Right;  Irrigation and Debridement right lower leg, ligation of saphenous vein.     OB History   No obstetric history on file.     Family History  Problem Relation Age of Onset  . Lupus Father   . Lymphoma Father   . Hyperlipidemia Mother   . Cancer Daughter        sarcoma  . Colon cancer Other     Social History   Tobacco Use  . Smoking status: Never Smoker  . Smokeless tobacco: Never Used  Vaping Use  . Vaping Use: Never used  Substance Use Topics  . Alcohol use: Yes    Alcohol/week: 7.0 standard drinks    Types: 4 Glasses of wine, 3 Standard drinks or equivalent per week    Comment: weekly  . Drug use: No    Home Medications Prior  to Admission medications   Medication Sig Start Date End Date Taking? Authorizing Provider  acetaminophen (TYLENOL) 650 MG CR tablet Take 1,300 mg by mouth every 8 (eight) hours as needed for pain.    [provider]  Biotin 5000 MCG CAPS Take 5,000 mcg by mouth daily.    [provider]  Calcium Carbonate-Vit D-Min (CALCIUM 1200 PO) Take 1 tablet by mouth at bedtime.    [provider]  Cholecalciferol (VITAMIN D) 2000 units CAPS Take 2,000 Units by mouth at bedtime.    [provider]  citalopram (CELEXA) 20 MG tablet Take 1 tablet (20 mg total) by mouth at bedtime. 02/10/21   Regalado, Belkys A, MD  citalopram (CELEXA) 20 MG tablet TAKE 1 TABLET (20 MG TOTAL) BY MOUTH AT BEDTIME. 02/10/21 02/10/22  Regalado, Belkys A, MD  clopidogrel (PLAVIX) 75 MG tablet TAKE 1 TABLET BY MOUTH EVERY DAY 01/05/21   Bensimhon, Shaune Pascal, MD  cycloSPORINE (RESTASIS) 0.05 % ophthalmic emulsion Place 2 drops into both eyes 2 (two) times daily.    [provider]  Ensure Max Protein (ENSURE MAX PROTEIN) LIQD Take 330 mLs (11 oz total) by mouth at bedtime. 02/10/21   Regalado, Belkys A, MD  FERREX 150 150 MG capsule TAKE 1 CAPSULE BY MOUTH TWICE A DAY 01/05/21   [name], MD  fish oil-omega-3 fatty acids 1000 MG capsule Take 1,000 mg by mouth 2 (two) times daily.    [provider]  folic acid (FOLVITE) 1 MG tablet TAKE 1 TABLET BY MOUTH EVERY DAY 01/05/21   [name], MD  gabapentin (NEURONTIN) 300 MG capsule Take 300 mg by mouth daily. 03/01/21   [provider]  HYDROcodone-acetaminophen (NORCO) 5-325 MG tablet Take 1 tablet by mouth as needed for severe pain. (Do not exceed 1 tablet every 8 hours) 02/14/21   Young, Franz Dell, PA-C  levothyroxine (SYNTHROID) 50 MCG tablet TAKE 1 TABLET BY MOUTH DAILY BEFORE BREAKFAST 03/05/21   [name], MD  loperamide (IMODIUM) 2 MG capsule Take 2-4 mg by mouth 4 (four) times daily as needed for diarrhea or loose  stools.    [provider]  MAGNESIUM ASPARTATE PO Take 150 mg by mouth in the morning and at bedtime.    [provider]  Multiple Vitamin (MULTIVITAMIN WITH MINERALS) TABS tablet Take 1 tablet by mouth daily at 12 noon.    [provider]  nutrition supplement, JUVEN, (JUVEN) PACK Take 1 packet by mouth 2 (two) times daily between meals. 02/10/21   Regalado, Cassie Freer, MD  nutrition supplement,  JUVEN, (JUVEN) PACK TAKE 1 PACKET BY MOUTH 2 (TWO) TIMES DAILY BETWEEN MEALS. 02/10/21 02/10/22  Regalado, Jerald Kief A, MD  Nutritional Supplements (ENSURE HIGH PROTEIN) LIQD TAKE 330 MLS (11 OZ TOTAL) BY MOUTH AT BEDTIME. 02/10/21 02/10/22  Regalado, Belkys A, MD  ondansetron (ZOFRAN) 4 MG tablet TAKE 1 TABLET BY MOUTH EVERY 8 HOURS AS NEEDED FOR NAUSEA AND VOMITING 03/05/21   [name], MD  OPSUMIT 10 MG tablet Take 1 tablet (67m) by mouth daily. 04/29/20   Bensimhon, DShaune Pascal MD  OXYGEN Inhale 2-3 L into the lungs continuous.     [provider]  pantoprazole (PROTONIX) 40 MG tablet Take 1 tablet (40 mg total) by mouth 2 (two) times daily. 10/05/20   BElby Showers MD  potassium chloride 20 MEQ/15ML (10%) SOLN TAKE 2 TEASPOONFUL (DILUTED IN WATER) ONCE DAILY 08/02/20   BElby Showers MD  predniSONE (DELTASONE) 5 MG tablet Take 5 mg by mouth daily. 04/04/20   [provider]  rosuvastatin (CRESTOR) 5 MG tablet Take 1 tablet every Monday, Wednesday, and Friday. 08/29/20   Bensimhon, DShaune Pascal MD  sildenafil (REVATIO) 20 MG tablet Take 3 tablets (60 mg total) by mouth 3 (three) times daily. 02/02/21   Bensimhon, DShaune Pascal MD  torsemide (DEMADEX) 20 MG tablet TAKE 2 TABLETS BY MOUTH IN THE AM AND 1 TABLET BY MOUTH IN THE PM. 03/07/21   Bensimhon, DShaune Pascal MD  valACYclovir (VALTREX) 500 MG tablet Take 500 mg by mouth as needed.    [provider]  vitamin B-12 (CYANOCOBALAMIN) 1000 MCG tablet Take 1,000 mcg by mouth daily.    [provider]    Allergies     Levofloxacin, Sulfa antibiotics, Doxycycline hyclate, and Penicillins  Review of Systems   Review of Systems  Constitutional: Negative.   HENT: Negative.   Respiratory: Negative.   Cardiovascular: Negative.   Gastrointestinal: Negative.   Musculoskeletal: Negative.   Skin: Positive for wound.  Neurological: Negative.   Psychiatric/Behavioral: Positive for confusion.    Physical Exam Updated Vital Signs BP (!) 126/52   Pulse 69   Temp 98.6 F (37 C) (Oral)   Resp 17   Ht _0  (1.727 m)   Wt 54 kg   SpO2 94%   BMI 18.09 kg/m   Physical Exam Vitals and nursing note reviewed.  HENT:     Head: Normocephalic and atraumatic.     Nose: Nose normal.     Mouth/Throat:     Mouth: Mucous membranes are moist.     Pharynx: Oropharynx is clear. Uvula midline. No oropharyngeal exudate or posterior oropharyngeal erythema.     Tonsils: No tonsillar exudate.  Eyes:     General: Lids are normal. Vision grossly intact.        Right eye: No discharge.        Left eye: No discharge.     Extraocular Movements: Extraocular movements intact.     Conjunctiva/sclera: Conjunctivae normal.     Pupils: Pupils are equal, round, and reactive to light.  Neck:     Trachea: Trachea and phonation normal.  Cardiovascular:     Rate and Rhythm: Normal rate and regular rhythm.     Pulses: Normal pulses.     Heart sounds: Normal heart sounds. No murmur heard.   Pulmonary:     Effort: Pulmonary effort is normal. No tachypnea, bradypnea, accessory muscle usage or respiratory distress.     Breath sounds: Normal breath sounds. No wheezing  or rales.  Chest:     Chest wall: No mass, lacerations, deformity, swelling, tenderness, crepitus or edema.  Abdominal:     General: Bowel sounds are normal. There is no distension.     Palpations: Abdomen is soft.     Tenderness: There is no abdominal tenderness. There is no right CVA tenderness, left CVA tenderness, guarding or rebound.  Musculoskeletal:         General: No deformity.     Cervical back: Normal range of motion and neck supple. No rigidity or crepitus. No pain with movement, spinous process tenderness or muscular tenderness.     Right lower leg: No edema.     Left lower leg: No edema.     Comments: Most of the digits of the right hand are missing secondary to amputation due to scleroderma changes. Lower extremities bilaterally are wrapped from the knee down wound management at [facility] for scleroderma wounds.  Not undressed at time of the initial evaluation.  Lymphadenopathy:     Cervical: No cervical adenopathy.  Skin:    General: Skin is warm and dry.     Capillary Refill: Capillary refill takes less than 2 seconds.  Neurological:     Mental Status: She is alert and oriented to person, place, and time. Mental status is at baseline.     Cranial Nerves: Cranial nerves are intact.     Sensory: Sensation is intact.     Motor: Motor function is intact.     Comments: Wheelchair bound at baseline.  Psychiatric:        Mood and Affect: Mood normal. Affect is flat.                      ED Results / Procedures / Treatments   Labs (all labs ordered are listed, but only abnormal results are displayed) Labs Reviewed  COMPREHENSIVE METABOLIC PANEL - Abnormal; Notable for the following components:      Result Value   Sodium 128 (*)    Chloride 92 (*)    Glucose, Bld 127 (*)    BUN 49 (*)    Creatinine, Ser 1.27 (*)    Calcium 8.5 (*)    Total Protein 6.4 (*)    Albumin 2.6 (*)    Total Bilirubin 0.2 (*)    GFR, Estimated 45 (*)    All other components within normal limits  CBC WITH DIFFERENTIAL/PLATELET - Abnormal; Notable for the following components:   WBC 22.8 (*)    RBC 2.61 (*)    Hemoglobin 8.0 (*)    HCT 25.3 (*)    RDW 19.1 (*)    Neutro Abs 20.8 (*)    Lymphs Abs 0.3 (*)    Abs Immature Granulocytes 0.82 (*)    All other components within normal limits  APTT - Abnormal; Notable for the  following components:   aPTT 39 (*)    All other components within normal limits  URINE CULTURE  CULTURE, BLOOD (ROUTINE X 2)  CULTURE, BLOOD (ROUTINE X 2)  RESP PANEL BY RT-PCR (FLU A&B, COVID) ARPGX2  LACTIC ACID, PLASMA  PROTIME-INR  MAGNESIUM  LACTIC ACID, PLASMA  URINALYSIS, ROUTINE W REFLEX MICROSCOPIC    EKG EKG Interpretation  Date/Time:  Thursday April 06 2021 16:28:45 EDT Ventricular Rate:  75 PR Interval:  173 QRS Duration: 162 QT Interval:  450 QTC Calculation: 503 R Axis:   114 Text Interpretation: Sinus rhythm RBBB and LPFB Anteroseptal infarct, age indeterminate  Lateral leads are also involved no acute ischemia Confirmed by Lorre Munroe ([id number]) on 04/22/2021 4:56:03 PM   Radiology CT Head Wo Contrast  Result Date: 04/16/2021 CLINICAL DATA:  70 year old female with altered mental status. EXAM: CT HEAD WITHOUT CONTRAST TECHNIQUE: Contiguous axial images were obtained from the base of the skull through the vertex without intravenous contrast. COMPARISON:  Head CT dated 02/05/2021. FINDINGS: Brain:Mild age-related atrophy and moderate chronic microvascular ischemic changes. Postsurgical changes of the right cerebellar lobe with an area of encephalomalacia. There is no acute intracranial hemorrhage. No mass effect or midline shift. No extra-axial fluid collection. Vascular: No hyperdense vessel or unexpected calcification. Skull: No acute calvarial pathology. Prior right occipital craniectomy. Sinuses/Orbits: Mild mucoperiosteal thickening of paranasal sinuses. There is perforation of the nasal septum. No air-fluid level. Left mastoid effusions. The right mastoid air cells are clear. Other: None IMPRESSION: 1. No acute intracranial pathology. 2. Age-related atrophy and chronic microvascular ischemic changes. 3. Postsurgical changes of right cerebellar lobe with an area of encephalomalacia. 4. Left mastoid effusions. Electronically Signed   By: Anner Crete M.D.   On: 04/05/2021  16:33   DG Chest Port 1 View  Result Date: 04/12/2021 CLINICAL DATA:  70 year old female with sepsis. EXAM: PORTABLE CHEST 1 VIEW COMPARISON:  Chest radiograph dated 02/08/2021. FINDINGS: There is diffuse chronic interstitial coarsening and bronchitic changes. No focal consolidation, pleural effusion, or pneumothorax. There is cardiomegaly. Bilateral hilar prominence, likely enlarged pulmonary arteries and suggestive of pulmonary hypertension. Atherosclerotic calcification of the aortic arch. No acute osseous pathology. IMPRESSION: 1. No acute cardiopulmonary process. 2. Cardiomegaly. Electronically Signed   By: Anner Crete M.D.   On: 04/28/2021 16:35    Procedures Procedures   Medications Ordered in ED Medications  0.9 %  sodium chloride infusion ( Intravenous New Bag/Given 04/01/2021 1728)  lactated ringers bolus 1,000 mL (0 mLs Intravenous Paused 04/04/2021 1727)  vancomycin (VANCOCIN) IVPB 1000 mg/200 mL premix (1,000 mg Intravenous New Bag/Given 03/31/2021 1834)  ceFEPIme (MAXIPIME) 2 g in sodium chloride 0.9 % 100 mL IVPB (0 g Intravenous Stopped 04/05/2021 1800)    ED Course  I have reviewed the triage vital signs and the nursing notes.  Pertinent labs & imaging results that were available during my care of the patient were reviewed by me and considered in my medical decision making (see chart for details).  Clinical Course as of 04/28/2021 1946  Thu Apr 06, 2021  1940 Consult to hospitalist, Dr. Tonie Griffith, who is agreeable to seeing this patient and admitting her to his service.  I appreciate his collaboration in the care of this patient.  He recommends admission to either [facility] or [facility], which ever can receive her more quickly. [RS]    Clinical Course User Index [RS] Zarah Carbon, Gypsy Balsam, PA-C   MDM Rules/Calculators/A&P                          70 year old female with history of scleroderma who presents to the emergency department after approximately 7 hours of altered  mental status from 530 this morning to 1230 this afternoon.  Vital signs are normal on intake.  Cardiopulmonary exam is normal, abdominal exam is benign.  Musculoskeletal exam revealed right hand with many patient secondary to scleroderma, bilateral lower extremities wrapped and dressed by wound care for scleroderma wounds, please see photos above for reference to her wounds. Purulent drainage from multiple sites, per husband increased from baseline. We will proceed  with sepsis order set at this time.   CBC with leukocytosis of 22,000, anemia with hemoglobin of 8.  CMP with BUN/creatinine at baseline.  Lactic acid is normal, 1.  Magnesium is normal.  Vital signs remained stable.  We will proceed with IV antibiotics, cefepime and Vanco given patient's extensive medication allergies.  CT scan of the head is negative for acute intracranial abnormality.  Chest x-ray is normal.  Given leukocytosis of 22,000, and purulent drainage visualized from multiple areas of the patient's lower extremity scleroderma wounds, feels patient would benefit for admission to the hospital for possible osteomyelitis.  Plain films ordered of the legs, ankles, and feet.  Shakeita voiced understanding of her medical evaluation and treatment plan.  Each of her questions was answered to her expressed satisfaction.  Patient is amenable to plan for admission at this time.  Consult to hospitalist as above, patient will be admitted to either [facility] or [facility], which ever can receive her more expediently. COVID-19 test pending  This chart was dictated using voice recognition software, Dragon. Despite the best efforts of this provider to proofread and correct errors, errors may still occur which can change documentation meaning.  Final Clinical Impression(s) / ED Diagnoses Final diagnoses:  None    Rx / DC Orders ED Discharge Orders    None       Aura Dials 04/29/2021 1947    Arnaldo Natal,  MD 04/08/21 [phone]

## 2021-04-06 NOTE — H&P (Incomplete)
History and Physical    [name] [name] JGO:[id number] DOB: July 15, 1951 DOA: 04/16/2021  PCP: [name], MD  Patient coming from: ***  I have personally briefly reviewed patient's old medical records in [place]  Chief Complaint: ***  HPI: [name] [name] is a 70 y.o. female with medical history significant for with PMH of scleroderma diagnosed in 1968, crest syndrome, pulmonary HTN, on continuous oxygen between 2-3L [place], non Hodgkin lymphoma, PAD, severe PVD s/p multiple stents and ablations, CHF, DVT s/p IVC filter-not on anticoagulation due to GI bleed, anemia, chronic leg ulcers with chronic pseudomonas colonization     ED Course: ***  Review of Systems:    Past Medical History:  Diagnosis Date  . Anemia   . Arthritis   . CHF (congestive heart failure) ([place])   . DOE (dyspnea on exertion)   . Epistaxis   . History of chicken pox   . Hypertension   . Hypothyroidism   . Leg ulcer ([place])   . Melanoma ([place]) 1999  . 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[place])   . Non Hodgkin's lymphoma ([place])    Treated with chemo 2010, felt to be cured.  Marland Kitchen PAH (pulmonary artery hypertension) ([place])   . Peripheral arterial occlusive disease ([place])   . Pneumonia 2008  . Pulmonary hypertension ([place])   . Requires supplemental oxygen    2 liters-3liters when moving  . Sclerodermia generalized [facility])     Past Surgical History:  Procedure Laterality Date  . ABDOMINAL AORTOGRAM W/LOWER EXTREMITY Bilateral 10/04/2020   Procedure: ABDOMINAL AORTOGRAM W/LOWER EXTREMITY;  Surgeon: Serafina Mitchell, MD;  Location: [place] [facility];  Service: Cardiovascular;  Laterality: Bilateral;  . ABDOMINAL AORTOGRAM W/LOWER EXTREMITY Bilateral 01/02/2021   Procedure: ABDOMINAL AORTOGRAM W/LOWER EXTREMITY;  Surgeon: Waynetta Sandy, MD;  Location: [place] [facility];  Service: Cardiovascular;  Laterality: Bilateral;  . AMPUTATION Right 10/24/2017   Procedure: RAY resection right index;  Surgeon: Daryll Brod, MD;   Location: [place];  Service: Orthopedics;  Laterality: Right;  Axillary block  . AMPUTATION Right 11/21/2018   Procedure: AMPUTATION RAY RIGHT MIDDLE FINGER;  Surgeon: Daryll Brod, MD;  Location: [place];  Service: Orthopedics;  Laterality: Right;  . AMPUTATION Right 10/22/2019   Procedure: AMPUTATION DIGIT RIGHT RING AND SMALL FINGER, DIGITAL SYMPATHECTOMY RIGHT RADIAL ULNAR ARCH;  Surgeon: Daryll Brod, MD;  Location: [place];  Service: Orthopedics;  Laterality: Right;  AXILARY  . apligraph  12-04-2102,   12-18-2012   bilateral calves   [name]  . APPLICATION OF A-CELL OF EXTREMITY Right 02/09/2021   Procedure: application of primatrix mesh;  Surgeon: Cindra Presume, MD;  Location: [place];  Service: Plastics;  Laterality: Right;  . biopsy on cerebellum    . COLONOSCOPY WITH PROPOFOL Left 10/07/2015   Procedure: COLONOSCOPY WITH PROPOFOL;  Surgeon: [name], MD;  Location: [place];  Service: Endoscopy;  Laterality: Left;  . ENDARTERECTOMY FEMORAL Right 09/14/2016   Procedure: ENDARTERECTOMY FEMORAL RIGHT;  Surgeon: [name], MD;  Location: [facility] OR;  Service: Vascular;  Laterality: Right;  . ENDOVENOUS ABLATION SAPHENOUS VEIN W/ LASER  07-24-2012   right greater saphenous vein by [name], M.D.   . ENDOVENOUS ABLATION SAPHENOUS VEIN W/ LASER  10-16-2012   left greater saphenous vein  by Dr. [name]  . ENDOVENOUS ABLATION SAPHENOUS VEIN W/ LASER Right 09-01-2015   endovenous laser ablation right greater saphenous vein by [name] MD  .  ESOPHAGOGASTRODUODENOSCOPY N/A 05/07/2016   Procedure: ESOPHAGOGASTRODUODENOSCOPY (EGD);  Surgeon: [name], MD;  Location: [facility] ENDOSCOPY;  Service: Endoscopy;  Laterality: N/A;  . ESOPHAGOGASTRODUODENOSCOPY N/A 11/23/2020   Procedure: ESOPHAGOGASTRODUODENOSCOPY (EGD);  Surgeon: [name], MD;  Location: [facility];  Service: Endoscopy;  Laterality: N/A;  . ESOPHAGOGASTRODUODENOSCOPY (EGD) WITH PROPOFOL Left  10/07/2015   Procedure: ESOPHAGOGASTRODUODENOSCOPY (EGD) WITH PROPOFOL;  Surgeon: [name], MD;  Location: [place];  Service: Endoscopy;  Laterality: Left;  . GIVENS CAPSULE STUDY N/A 07/31/2016   Procedure: GIVENS CAPSULE STUDY;  Surgeon: [name], MD;  Location: [facility] END[facility]COPY;  Service: Endoscopy;  Laterality: N/A;  . GIVENS CAPSULE STUDY N/A 11/23/2020   Procedure: GIVENS CAPSULE STUDY;  Surgeon: [name], MD;  Location: [facility] ENDOSCOPY;  Service: Endoscopy;  Laterality: N/A;  . I & D EXTREMITY Right 02/09/2021   Procedure: Debridement of right lower extremity wound;  Surgeon: Cindra Presume, MD;  Location: [place];  Service: Plastics;  Laterality: Right;  1 hour, please  . IR IVC FILTER PLMT / S&I /IMG GUID/MOD SED  11/22/2020  . LOWER EXTREMITY ANGIOGRAPHY N/A 10/25/2020   Procedure: LOWER EXTREMITY ANGIOGRAPHY - Left;  Surgeon: Serafina Mitchell, MD;  Location: [place] [facility];  Service: Cardiovascular;  Laterality: N/A;  . melonoma removes Right    behind right knee  . OPEN REDUCTION INTERNAL FIXATION (ORIF) DISTAL RADIAL FRACTURE Left 11/24/2019   Procedure: OPEN REDUCTION INTERNAL FIXATION (ORIF) DISTAL RADIAL FRACTURE;  Surgeon: Altamese Crossett, MD;  Location: [place];  Service: Orthopedics;  Laterality: Left;  . PATCH ANGIOPLASTY Right 09/14/2016   Procedure: PATCH ANGIOPLASTY RIGHT FEMORAL ARTERY USING HEMASHIELD PLATINUM FINESSE PATCH;  Surgeon: [name], MD;  Location: [place];  Service: Vascular;  Laterality: Right;  . PERIPHERAL VASCULAR BALLOON ANGIOPLASTY Right 10/04/2020   Procedure: PERIPHERAL VASCULAR BALLOON ANGIOPLASTY;  Surgeon: Serafina Mitchell, MD;  Location: [place] [facility];  Service: Cardiovascular;  Laterality: Right;  SFA/PT/AT/PTtrunk  . PERIPHERAL VASCULAR BALLOON ANGIOPLASTY Left 10/25/2020   Procedure: PERIPHERAL VASCULAR BALLOON ANGIOPLASTY;  Surgeon: Serafina Mitchell, MD;  Location: [place] [facility];  Service: Cardiovascular;  Laterality: Left;   anterior tibial and tibioperoneal trunk  . PERIPHERAL VASCULAR CATHETERIZATION N/A 09/07/2016   Procedure: Abdominal Aortogram w/Lower Extremity;  Surgeon: [name], MD;  Location: [place] [facility];  Service: Cardiovascular;  Laterality: N/A;  . RIGHT HEART CATH N/A 04/19/2017   Procedure: Right Heart Cath;  Surgeon: [name], MD;  Location: [place] [facility];  Service: Cardiovascular;  Laterality: N/A;  . RIGHT HEART CATHETERIZATION N/A 11/14/2012   Procedure: RIGHT HEART CATH;  Surgeon: [name], MD;  Location: [facility] CATH LAB;  Service: Cardiovascular;  Laterality: N/A;  . rt little toe removal  10/1998  . stent left thigh  3/11  . TENDON TRANSFER Right 10/24/2017   Procedure: transfer extensor indecus propius/extensor digitorum commomus index to middle ring and small;  Surgeon: Daryll Brod, MD;  Location: [place];  Service: Orthopedics;  Laterality: Right;  . TONSILLECTOMY    . TONSILLECTOMY AND ADENOIDECTOMY    . TUNNELED VENOUS CATHETER PLACEMENT  01/2013  . ULNAR HEAD EXCISION Right 10/24/2017   Procedure: darrach right ulna;  Surgeon: Daryll Brod, MD;  Location: [place];  Service: Orthopedics;  Laterality: Right;  . VEIN LIGATION AND STRIPPING  05/16/2012   Procedure: VEIN LIGATION AND STRIPPING;  Surgeon: [name], MD;  Location: [place];  Service: Vascular;  Laterality: Right;  Irrigation and Debridement right lower leg, ligation of saphenous vein.     reports that she has never smoked. She has never used smokeless tobacco. She reports current alcohol use of about 7.0 standard drinks of alcohol per week. She reports that she does not use drugs. Social History  Allergies  Allergen Reactions  . Levofloxacin Other (See Comments)    FLU LIKE SYMPTOM   . Sulfa Antibiotics Rash and Other (See Comments)    Face peeled, Severe rash with significant peeling of face. No airway involvement per patient   . Doxycycline Hyclate Swelling    SWELLING REACTION UNSPECIFIED  ; "TABLETS ONLY - CAPSULES ARE TOLERATED FINE"  . Penicillins Rash    Has patient had a PCN reaction causing immediate rash, facial/tongue/throat swelling, SOB or lightheadedness with hypotension: no Has patient had a PCN reaction causing severe rash involving mucus membranes or skin necrosis: no Has patient had a PCN reaction that required hospitalization: no Has patient had a PCN reaction occurring within the last 10 years: {no If all of the above answers are "NO", then may proceed with Cephalosporin use.    Family History  Problem Relation Age of Onset  . Lupus Father   . Lymphoma Father   . Hyperlipidemia Mother   . Cancer Daughter        sarcoma  . Colon cancer Other      Prior to Admission medications   Medication Sig Start Date End Date Taking? Authorizing Provider  OXYGEN Inhale 2-3 L into the lungs continuous.    Yes [provider]  acetaminophen (TYLENOL) 650 MG CR tablet Take 1,300 mg by mouth every 8 (eight) hours as needed for pain.    [provider]  Biotin 5000 MCG CAPS Take 5,000 mcg by mouth daily.    [provider]  Calcium Carbonate-Vit D-Min (CALCIUM 1200 PO) Take 1 tablet by mouth at bedtime.    [provider]  Cholecalciferol (VITAMIN D) 2000 units CAPS Take 2,000 Units by mouth at bedtime.    [provider]  citalopram (CELEXA) 20 MG tablet Take 1 tablet (20 mg total) by mouth at bedtime. 02/10/21   Regalado, Belkys A, MD  citalopram (CELEXA) 20 MG tablet TAKE 1 TABLET (20 MG TOTAL) BY MOUTH AT BEDTIME. 02/10/21 02/10/22  Regalado, Belkys A, MD  clopidogrel (PLAVIX) 75 MG tablet TAKE 1 TABLET BY MOUTH EVERY DAY 01/05/21   Bensimhon, Shaune Pascal, MD  cycloSPORINE (RESTASIS) 0.05 % ophthalmic emulsion Place 2 drops into both eyes 2 (two) times daily.    [provider]  Ensure Max Protein (ENSURE MAX PROTEIN) LIQD Take 330 mLs (11 oz total) by mouth at bedtime. 02/10/21   Regalado, Belkys A, MD  FERREX 150 150 MG  capsule TAKE 1 CAPSULE BY MOUTH TWICE A DAY 01/05/21   [name], MD  fish oil-omega-3 fatty acids 1000 MG capsule Take 1,000 mg by mouth 2 (two) times daily.    [provider]  folic acid (FOLVITE) 1 MG tablet TAKE 1 TABLET BY MOUTH EVERY DAY 01/05/21   [name], MD  gabapentin (NEURONTIN) 100 MG capsule Take 100 mg by mouth 4 (four) times daily. 03/01/21   [provider]  HYDROcodone-acetaminophen (NORCO) 5-325 MG tablet Take 1 tablet by mouth as needed for severe pain. (Do not exceed 1 tablet every 8 hours) 02/14/21   Young, Franz Dell, PA-C  levothyroxine (SYNTHROID) 50 MCG tablet TAKE 1 TABLET BY MOUTH DAILY BEFORE BREAKFAST  03/05/21   [name], MD  loperamide (IMODIUM) 2 MG capsule Take 2-4 mg by mouth 4 (four) times daily as needed for diarrhea or loose stools.    [provider]  MAGNESIUM ASPARTATE PO Take 150 mg by mouth in the morning and at bedtime.    [provider]  Multiple Vitamin (MULTIVITAMIN WITH MINERALS) TABS tablet Take 1 tablet by mouth daily at 12 noon.    [provider]  nutrition supplement, JUVEN, (JUVEN) PACK Take 1 packet by mouth 2 (two) times daily between meals. 02/10/21   Regalado, Cassie Freer, MD  nutrition supplement, JUVEN, (JUVEN) PACK TAKE 1 PACKET BY MOUTH 2 (TWO) TIMES DAILY BETWEEN MEALS. 02/10/21 02/10/22  Regalado, Jerald Kief A, MD  Nutritional Supplements (ENSURE HIGH PROTEIN) LIQD TAKE 330 MLS (11 OZ TOTAL) BY MOUTH AT BEDTIME. 02/10/21 02/10/22  Regalado, Belkys A, MD  ondansetron (ZOFRAN) 4 MG tablet TAKE 1 TABLET BY MOUTH EVERY 8 HOURS AS NEEDED FOR NAUSEA AND VOMITING 03/05/21   [name], MD  OPSUMIT 10 MG tablet Take 1 tablet (33m) by mouth daily. 04/29/20   Bensimhon, DShaune Pascal MD  pantoprazole (PROTONIX) 40 MG tablet Take 1 tablet (40 mg total) by mouth 2 (two) times daily. 10/05/20   BElby Showers MD  potassium chloride 20 MEQ/15ML (10%) SOLN TAKE 2 TEASPOONFUL (DILUTED IN WATER) ONCE DAILY 08/02/20    BElby Showers MD  predniSONE (DELTASONE) 5 MG tablet Take 5 mg by mouth daily. 04/04/20   [provider]  rosuvastatin (CRESTOR) 5 MG tablet Take 1 tablet every Monday, Wednesday, and Friday. 08/29/20   Bensimhon, DShaune Pascal MD  sildenafil (REVATIO) 20 MG tablet Take 3 tablets (60 mg total) by mouth 3 (three) times daily. Patient not taking: Reported on 04/12/2021 02/02/21   Bensimhon, DShaune Pascal MD  torsemide (DEMADEX) 20 MG tablet TAKE 2 TABLETS BY MOUTH IN THE AM AND 1 TABLET BY MOUTH IN THE PM. 03/07/21   Bensimhon, DShaune Pascal MD  valACYclovir (VALTREX) 500 MG tablet Take 500 mg by mouth as needed.    [provider]  vitamin B-12 (CYANOCOBALAMIN) 1000 MCG tablet Take 1,000 mcg by mouth daily.    [provider]    Physical Exam: Vitals:   04/01/2021 1815 04/11/2021 1900 04/18/2021 2000 04/20/2021 2300  BP: (!) 126/52  (!) 154/60 (!) 140/52  Pulse: 69   94  Resp: 17  18 (!) 21  Temp:   98.6 F (37 C) 98.6 F (37 C)  TempSrc:   Oral Oral  SpO2: 94% 94% 96% (!) 80%  Weight:      Height:        Constitutional: NAD, calm, comfortable Vitals:   04/20/2021 1815 04/27/2021 1900 04/09/2021 2000 04/14/2021 2300  BP: (!) 126/52  (!) 154/60 (!) 140/52  Pulse: 69   94  Resp: 17  18 (!) 21  Temp:   98.6 F (37 C) 98.6 F (37 C)  TempSrc:   Oral Oral  SpO2: 94% 94% 96% (!) 80%  Weight:      Height:       Eyes: PERRL, lids and conjunctivae normal ENMT: Mucous membranes are moist. Posterior pharynx clear of any exudate or lesions.Normal dentition.  Neck: normal, supple, no masses, no thyromegaly Respiratory: clear to auscultation bilaterally, no wheezing, no crackles. Normal respiratory effort. No accessory muscle use.  Cardiovascular: Regular rate and rhythm, no murmurs / rubs / gallops. No extremity edema. 2+ pedal pulses. No carotid  bruits.  Abdomen: no tenderness, no masses palpated. No hepatosplenomegaly. Bowel sounds positive.  Musculoskeletal: no clubbing / cyanosis. No  joint deformity upper and lower extremities. Good ROM, no contractures. Normal muscle tone.  Skin: no rashes, lesions, ulcers. No induration Neurologic: CN 2-12 grossly intact. Sensation intact, DTR normal. Strength 5/5 in all 4.  Psychiatric: Normal judgment and insight. Alert and oriented x 3. Normal mood.   (Anything < 9 systems with 2 bullets each down codes to level 1) (If patient refuses exam can't bill higher level) (Make sure to document decubitus ulcers present on admission -- if possible -- and whether patient has chronic indwelling catheter at time of admission)  Labs on Admission: I have personally reviewed following labs and imaging studies  CBC: Recent Labs  Lab 04/26/2021 1539  WBC 22.8*  NEUTROABS 20.8*  HGB 8.0*  HCT 25.3*  MCV 96.9  PLT 160   Basic Metabolic Panel: Recent Labs  Lab 04/16/2021 1539 04/22/2021 1623  NA 128*  --   K 4.6  --   CL 92*  --   CO2 25  --   GLUCOSE 127*  --   BUN 49*  --   CREATININE 1.27*  --   CALCIUM 8.5*  --   MG  --  2.0   GFR: Estimated Creatinine Clearance: 34.6 mL/min (A) (by C-G formula based on SCr of 1.27 mg/dL (H)). Liver Function Tests: Recent Labs  Lab 04/22/2021 1539  AST 26  ALT 17  ALKPHOS 119  BILITOT 0.2*  PROT 6.4*  ALBUMIN 2.6*   No results for input(s): LIPASE, AMYLASE in the last 168 hours. No results for input(s): AMMONIA in the last 168 hours. Coagulation Profile: Recent Labs  Lab 04/22/2021 1539  INR 0.9   Cardiac Enzymes: No results for input(s): CKTOTAL, CKMB, CKMBINDEX, TROPONINI in the last 168 hours. BNP (last 3 results) Recent Labs    11/21/20 1439  PROBNP 526.0*   HbA1C: No results for input(s): HGBA1C in the last 72 hours. CBG: No results for input(s): GLUCAP in the last 168 hours. Lipid Profile: No results for input(s): CHOL, HDL, LDLCALC, TRIG, CHOLHDL, LDLDIRECT in the last 72 hours. Thyroid Function Tests: No results for input(s): TSH, T4TOTAL, FREET4, T3FREE, THYROIDAB in  the last 72 hours. Anemia Panel: No results for input(s): VITAMINB12, FOLATE, FERRITIN, TIBC, IRON, RETICCTPCT in the last 72 hours. Urine analysis:    Component Value Date/Time   COLORURINE YELLOW 04/27/2021 1626   APPEARANCEUR CLEAR 04/28/2021 1626   LABSPEC <1.005 (L) 04/21/2021 1626   PHURINE 6.0 04/25/2021 1626   GLUCOSEU NEGATIVE 04/22/2021 1626   HGBUR NEGATIVE 04/03/2021 1626   BILIRUBINUR NEGATIVE 04/05/2021 1626   BILIRUBINUR NEG 02/27/2021 1226   KETONESUR NEGATIVE 04/26/2021 1626   PROTEINUR NEGATIVE 03/31/2021 1626   UROBILINOGEN 0.2 02/27/2021 1226   NITRITE NEGATIVE 04/29/2021 1626   LEUKOCYTESUR NEGATIVE 03/31/2021 1626    Radiological Exams on Admission: DG Tibia/Fibula Left  Result Date: 04/22/2021 CLINICAL DATA:  Wounds EXAM: LEFT TIBIA AND FIBULA - 2 VIEW COMPARISON:  None. FINDINGS: Diffuse vascular calcifications. No acute bony abnormality. Specifically, no fracture, subluxation, or dislocation. No bone destruction to suggest osteomyelitis. IMPRESSION: No acute bony abnormality. Electronically Signed   By: Rolm Baptise M.D.   On: 04/16/2021 19:54   DG Tibia/Fibula Right  Result Date: 04/25/2021 CLINICAL DATA:  Wounds EXAM: RIGHT TIBIA AND FIBULA - 2 VIEW COMPARISON:  None. FINDINGS: Dilated calcified vascular structure, likely calcified varicose veins. Arterial calcifications. No  acute bony abnormality. Specifically, no fracture, subluxation, or dislocation. No bone destruction. IMPRESSION: No acute bony abnormality. Electronically Signed   By: Rolm Baptise M.D.   On: 04/09/2021 19:57   DG Ankle Complete Left  Result Date: 04/21/2021 CLINICAL DATA:  Wounds EXAM: LEFT ANKLE COMPLETE - 3+ VIEW COMPARISON:  None. FINDINGS: Vascular calcifications. No acute bony abnormality. Specifically, no fracture, subluxation, or dislocation. No bone destruction. Soft tissues are intact. IMPRESSION: No acute bony abnormality. Electronically Signed   By: Rolm Baptise M.D.   On:  04/21/2021 19:55   DG Ankle Complete Right  Result Date: 04/05/2021 CLINICAL DATA:  Wounds EXAM: RIGHT ANKLE - COMPLETE 3+ VIEW COMPARISON:  None. FINDINGS: Diffuse vascular calcifications. Diffuse osteopenia. No acute bony abnormality. Specifically, no fracture, subluxation, or dislocation. No bone destruction. IMPRESSION: No acute bony abnormality. Electronically Signed   By: Rolm Baptise M.D.   On: 04/13/2021 19:59   CT Head Wo Contrast  Result Date: 04/05/2021 CLINICAL DATA:  70 year old female with altered mental status. EXAM: CT HEAD WITHOUT CONTRAST TECHNIQUE: Contiguous axial images were obtained from the base of the skull through the vertex without intravenous contrast. COMPARISON:  Head CT dated 02/05/2021. FINDINGS: Brain:Mild age-related atrophy and moderate chronic microvascular ischemic changes. Postsurgical changes of the right cerebellar lobe with an area of encephalomalacia. There is no acute intracranial hemorrhage. No mass effect or midline shift. No extra-axial fluid collection. Vascular: No hyperdense vessel or unexpected calcification. Skull: No acute calvarial pathology. Prior right occipital craniectomy. Sinuses/Orbits: Mild mucoperiosteal thickening of paranasal sinuses. There is perforation of the nasal septum. No air-fluid level. Left mastoid effusions. The right mastoid air cells are clear. Other: None IMPRESSION: 1. No acute intracranial pathology. 2. Age-related atrophy and chronic microvascular ischemic changes. 3. Postsurgical changes of right cerebellar lobe with an area of encephalomalacia. 4. Left mastoid effusions. Electronically Signed   By: Anner Crete M.D.   On: 04/08/2021 16:33   DG Chest Port 1 View  Result Date: 04/16/2021 CLINICAL DATA:  70 year old female with sepsis. EXAM: PORTABLE CHEST 1 VIEW COMPARISON:  Chest radiograph dated 02/08/2021. FINDINGS: There is diffuse chronic interstitial coarsening and bronchitic changes. No focal consolidation, pleural  effusion, or pneumothorax. There is cardiomegaly. Bilateral hilar prominence, likely enlarged pulmonary arteries and suggestive of pulmonary hypertension. Atherosclerotic calcification of the aortic arch. No acute osseous pathology. IMPRESSION: 1. No acute cardiopulmonary process. 2. Cardiomegaly. Electronically Signed   By: Anner Crete M.D.   On: 03/31/2021 16:35   DG Foot Complete Left  Result Date: 03/31/2021 CLINICAL DATA:  Wounds EXAM: LEFT FOOT - COMPLETE 3+ VIEW COMPARISON:  None. FINDINGS: Hallux valgus deformity. Diffuse osteopenia. No acute bony abnormality. Specifically, no fracture, subluxation, or dislocation. There appears to be bone destruction in the distal phalanx of the 4th and possibly 5th toes, age indeterminate. IMPRESSION: Apparent bone destruction in the distal phalanges of the left 4th and 5th toes, question chronicity. Cannot exclude acute or chronic osteomyelitis. Electronically Signed   By: Rolm Baptise M.D.   On: 04/12/2021 19:56   DG Foot Complete Right  Result Date: 04/13/2021 CLINICAL DATA:  Wounds EXAM: RIGHT FOOT COMPLETE - 3+ VIEW COMPARISON:  None. 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 No bone destruction seen to suggest acute osteomyelitis. Diffuse osteopenia. Hallux valgus deformity. No acute bony abnormality. Specifically, no fracture, subluxation, or dislocation. IMPRESSION: Chronic changes as above. No acute bony abnormality. Electronically Signed  By: Rolm Baptise M.D.   On: 04/02/2021 20:00      Assessment/Plan Active Problems:   Cellulitis, leg   Wound cellulitis  (please populate well all problems here in Problem List. (For example, if patient is on BP meds at home and you resume or decide to hold them, it is a problem that needs to be her. Same for CAD, COPD, HLD and so on)   ***  DVT prophylaxis: *** (Lovenox/Heparin/SCD's/anticoagulated/None (if comfort  care) Code Status: *** (Full/Partial (specify details) Family Communication: *** (Specify name, relationship. Do not write "discussed with patient". Specify tel # if discussed over the phone) Disposition Plan: *** (specify when and where you expect patient to be discharged) Consults called: *** (with names) Admission status: *** (inpatient / obs / tele / medical floor / SDU)  Level of care: Med-Surg  Status is: Inpatient  {Inpatient:23812}  Dispo: The patient is from: {From:23814}              Anticipated d/c is to: {To:23815}              Patient currently {Medically stable:23817}   Difficult to place patient {Yes/No:25151}         Orene Desanctis DO [facility]   If 7PM-7AM, please contact night-coverage [email or web address]   04/05/2021, 11:48 PM

## 2021-04-06 NOTE — ED Notes (Signed)
Episode at about 0530 am  Of confusion.  States speaking incoherent.  Weaker than normal at this time  Patient states does not remeber episode. Episode lasted about three hours.  States back to baseline at present.  States this happen before 2-3 times in last four years.  States electrolytes where "off" at these times.

## 2021-04-06 NOTE — ED Notes (Signed)
BLOOD CULTURES X 2 OBTAINED

## 2021-04-06 NOTE — ED Notes (Signed)
RT had a difficult time obtaining pulse ox. Finally got it to register on forehead. SAT 99-100% on 2LNC. Wears o2 at home

## 2021-04-07 ENCOUNTER — Inpatient Hospital Stay (HOSPITAL_COMMUNITY): Payer: Medicare Other

## 2021-04-07 DIAGNOSIS — S81801A Unspecified open wound, right lower leg, initial encounter: Secondary | ICD-10-CM | POA: Diagnosis not present

## 2021-04-07 DIAGNOSIS — N179 Acute kidney failure, unspecified: Secondary | ICD-10-CM | POA: Diagnosis not present

## 2021-04-07 DIAGNOSIS — Z8619 Personal history of other infectious and parasitic diseases: Secondary | ICD-10-CM

## 2021-04-07 DIAGNOSIS — R404 Transient alteration of awareness: Secondary | ICD-10-CM | POA: Diagnosis not present

## 2021-04-07 DIAGNOSIS — S81802A Unspecified open wound, left lower leg, initial encounter: Secondary | ICD-10-CM

## 2021-04-07 DIAGNOSIS — R6510 Systemic inflammatory response syndrome (SIRS) of non-infectious origin without acute organ dysfunction: Secondary | ICD-10-CM

## 2021-04-07 DIAGNOSIS — J849 Interstitial pulmonary disease, unspecified: Secondary | ICD-10-CM | POA: Diagnosis not present

## 2021-04-07 LAB — BASIC METABOLIC PANEL
Anion gap: 12 (ref 5–15)
BUN: 44 mg/dL — ABNORMAL HIGH (ref 8–23)
CO2: 24 mmol/L (ref 22–32)
Calcium: 8.4 mg/dL — ABNORMAL LOW (ref 8.9–10.3)
Chloride: 94 mmol/L — ABNORMAL LOW (ref 98–111)
Creatinine, Ser: 1.16 mg/dL — ABNORMAL HIGH (ref 0.44–1.00)
GFR, Estimated: 50 mL/min — ABNORMAL LOW (ref >60)
Glucose, Bld: 90 mg/dL (ref 70–99)
Potassium: 5.2 mmol/L — ABNORMAL HIGH (ref 3.5–5.1)
Sodium: 130 mmol/L — ABNORMAL LOW (ref 135–145)

## 2021-04-07 LAB — BLOOD CULTURE ID PANEL (REFLEXED) - BCID2

## 2021-04-07 LAB — CBC
HCT: 29.2 % — ABNORMAL LOW (ref 36.0–46.0)
Hemoglobin: 9.1 g/dL — ABNORMAL LOW (ref 12.0–15.0)
MCH: 31.1 pg (ref 26.0–34.0)
MCHC: 31.2 g/dL (ref 30.0–36.0)
MCV: 99.7 fL (ref 80.0–100.0)
Platelets: 365 10*3/uL (ref 150–400)
RBC: 2.93 MIL/uL — ABNORMAL LOW (ref 3.87–5.11)
RDW: 19.2 % — ABNORMAL HIGH (ref 11.5–15.5)
WBC: 18.3 10*3/uL — ABNORMAL HIGH (ref 4.0–10.5)
nRBC: 0.3 % — ABNORMAL HIGH (ref 0.0–0.2)

## 2021-04-07 LAB — LACTIC ACID, PLASMA
Lactic Acid, Venous: 1.8 mmol/L (ref 0.5–1.9)
Lactic Acid, Venous: 2.4 mmol/L (ref 0.5–1.9)
Lactic Acid, Venous: 2.4 mmol/L (ref 0.5–1.9)

## 2021-04-07 MED ORDER — MAGNESIUM OXIDE 400 (241.3 MG) MG PO TABS
200.0000 mg | ORAL_TABLET | Freq: Two times a day (BID) | ORAL | Status: DC
  Administered 2021-04-07 – 2021-04-13 (×6): 200 mg via ORAL
  Filled 2021-04-07 (×8): qty 1

## 2021-04-07 MED ORDER — JUVEN PO PACK
1.0000 | PACK | Freq: Two times a day (BID) | ORAL | Status: DC
  Administered 2021-04-07 – 2021-04-08 (×2): 1 via ORAL
  Filled 2021-04-07 (×11): qty 1

## 2021-04-07 MED ORDER — ACETAMINOPHEN 325 MG PO TABS
650.0000 mg | ORAL_TABLET | Freq: Four times a day (QID) | ORAL | Status: DC | PRN
  Administered 2021-04-07 – 2021-04-08 (×3): 650 mg via ORAL
  Filled 2021-04-07 (×3): qty 2

## 2021-04-07 MED ORDER — VANCOMYCIN HCL 1000 MG/200ML IV SOLN
1000.0000 mg | INTRAVENOUS | Status: DC
  Administered 2021-04-08: 1000 mg via INTRAVENOUS
  Filled 2021-04-07: qty 200

## 2021-04-07 MED ORDER — MACITENTAN 10 MG PO TABS
10.0000 mg | ORAL_TABLET | Freq: Every day | ORAL | Status: DC
  Administered 2021-04-07 – 2021-04-09 (×3): 10 mg via ORAL
  Filled 2021-04-07 (×6): qty 1

## 2021-04-07 MED ORDER — SODIUM CHLORIDE 0.9 % IV SOLN: INTRAVENOUS | Status: AC

## 2021-04-07 MED ORDER — OXYCODONE-ACETAMINOPHEN 5-325 MG PO TABS
1.0000 | ORAL_TABLET | ORAL | Status: DC | PRN
  Administered 2021-04-12: 1 via ORAL
  Filled 2021-04-07: qty 1

## 2021-04-07 MED ORDER — FOLIC ACID 1 MG PO TABS
1.0000 mg | ORAL_TABLET | Freq: Every day | ORAL | Status: DC
  Administered 2021-04-07 – 2021-04-09 (×3): 1 mg via ORAL
  Filled 2021-04-07 (×4): qty 1

## 2021-04-07 MED ORDER — CLOPIDOGREL BISULFATE 75 MG PO TABS
75.0000 mg | ORAL_TABLET | Freq: Every day | ORAL | Status: DC
  Administered 2021-04-07 – 2021-04-09 (×3): 75 mg via ORAL
  Filled 2021-04-07 (×4): qty 1

## 2021-04-07 MED ORDER — CYCLOSPORINE 0.05 % OP EMUL
2.0000 [drp] | Freq: Two times a day (BID) | OPHTHALMIC | Status: DC
  Administered 2021-04-07 – 2021-04-16 (×16): 2 [drp] via OPHTHALMIC
  Filled 2021-04-07 (×21): qty 1

## 2021-04-07 MED ORDER — MACITENTAN 10 MG PO TABS: 10.0000 mg | ORAL_TABLET | Freq: Every day | ORAL | Status: DC

## 2021-04-07 MED ORDER — ENSURE MAX PROTEIN PO LIQD
11.0000 [oz_av] | Freq: Every day | ORAL | Status: DC
  Administered 2021-04-07 (×2): 11 [oz_av] via ORAL
  Filled 2021-04-07 (×4): qty 330

## 2021-04-07 MED ORDER — SODIUM CHLORIDE 0.9 % IV SOLN
2.0000 g | Freq: Two times a day (BID) | INTRAVENOUS | Status: DC
  Administered 2021-04-07 – 2021-04-11 (×9): 2 g via INTRAVENOUS
  Filled 2021-04-07: qty 2
  Filled 2021-04-07: qty 1.67
  Filled 2021-04-07 (×3): qty 2
  Filled 2021-04-07: qty 1.67
  Filled 2021-04-07 (×4): qty 2

## 2021-04-07 MED ORDER — VITAMIN B-12 1000 MCG PO TABS
1000.0000 ug | ORAL_TABLET | Freq: Every day | ORAL | Status: DC
  Administered 2021-04-07 – 2021-04-09 (×3): 1000 ug via ORAL
  Filled 2021-04-07 (×4): qty 1

## 2021-04-07 MED ORDER — CITALOPRAM HYDROBROMIDE 20 MG PO TABS
20.0000 mg | ORAL_TABLET | Freq: Every day | ORAL | Status: DC
  Administered 2021-04-07 – 2021-04-13 (×3): 20 mg via ORAL
  Filled 2021-04-07 (×4): qty 1

## 2021-04-07 MED ORDER — HYDROMORPHONE HCL 1 MG/ML IJ SOLN
0.5000 mg | INTRAMUSCULAR | Status: DC | PRN
  Administered 2021-04-07 – 2021-04-10 (×4): 0.5 mg via INTRAVENOUS
  Filled 2021-04-07 (×4): qty 0.5

## 2021-04-07 MED ORDER — PANTOPRAZOLE SODIUM 40 MG PO TBEC
40.0000 mg | DELAYED_RELEASE_TABLET | Freq: Two times a day (BID) | ORAL | Status: DC
  Administered 2021-04-07 – 2021-04-09 (×4): 40 mg via ORAL
  Filled 2021-04-07 (×5): qty 1

## 2021-04-07 MED ORDER — LEVOTHYROXINE SODIUM 50 MCG PO TABS
50.0000 ug | ORAL_TABLET | Freq: Every day | ORAL | Status: DC
  Administered 2021-04-07 – 2021-04-14 (×5): 50 ug via ORAL
  Filled 2021-04-07 (×5): qty 1

## 2021-04-07 MED ORDER — TORSEMIDE 20 MG PO TABS
40.0000 mg | ORAL_TABLET | Freq: Every day | ORAL | Status: DC
  Administered 2021-04-07 – 2021-04-09 (×3): 40 mg via ORAL
  Filled 2021-04-07 (×4): qty 2

## 2021-04-07 MED ORDER — TORSEMIDE 20 MG PO TABS
20.0000 mg | ORAL_TABLET | Freq: Every day | ORAL | Status: DC
  Administered 2021-04-07: 20 mg via ORAL
  Filled 2021-04-07 (×3): qty 1

## 2021-04-07 MED ORDER — HYDROCODONE-ACETAMINOPHEN 5-325 MG PO TABS
1.0000 | ORAL_TABLET | ORAL | Status: DC | PRN
  Administered 2021-04-07: 1 via ORAL
  Filled 2021-04-07 (×2): qty 1

## 2021-04-07 MED ORDER — MORPHINE SULFATE (PF) 2 MG/ML IV SOLN
0.5000 mg | INTRAVENOUS | Status: DC | PRN
  Administered 2021-04-07 (×2): 0.5 mg via INTRAVENOUS
  Filled 2021-04-07 (×2): qty 1

## 2021-04-07 MED ORDER — HEPARIN SODIUM (PORCINE) 5000 UNIT/ML IJ SOLN
5000.0000 [IU] | Freq: Three times a day (TID) | INTRAMUSCULAR | Status: DC
  Administered 2021-04-07 – 2021-04-16 (×27): 5000 [IU] via SUBCUTANEOUS
  Filled 2021-04-07 (×28): qty 1

## 2021-04-07 MED ORDER — ADULT MULTIVITAMIN W/MINERALS CH
1.0000 | ORAL_TABLET | Freq: Every day | ORAL | Status: DC
  Administered 2021-04-07 – 2021-04-09 (×3): 1 via ORAL
  Filled 2021-04-07 (×3): qty 1

## 2021-04-07 NOTE — Consult Note (Addendum)
WOC consult requested for bilat legs.  I reviewed the progress notes and photos in the EMR; pt has multiple full thickness wounds which are significantly necrotic with mod amt green drainage and their severity is beyond the scope of practice for [facility] nurses.  Pt had amputation recommended in the past and refused the suggestion at that time.  Communicated with primary team via secure chat message; please consult ortho service for further recommendations of this complex situation.   Please re-consult if further assistance is needed.  Thank-you,  Julien Girt MSN, [place], [place], [place], [place]

## 2021-04-07 NOTE — Progress Notes (Signed)
Patient left unit for MRI.

## 2021-04-07 NOTE — Progress Notes (Signed)
Pt unable to hold still for MRI. Pt constantly moving legs due to pain. Pain meds were given prior to MRI. Limited sequences sent to PACS. RN made aware of very motion degraded images and limited imaging sent to pacs. Pt is currently in too much pain to obtain entire MRI imaging.

## 2021-04-07 NOTE — Telephone Encounter (Signed)
Faxed ED notes to Hulen Shouts , NP at [facility] [phone].

## 2021-04-07 NOTE — Progress Notes (Signed)
PROGRESS NOTE    [name] [name]  IRJ:[id number] DOB: 1951-02-12 DOA: 04/11/2021 PCP: [name], MD    Brief Narrative:  70 y.o. 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   Assessment & Plan:   Principal Problem:   Cellulitis, leg Active Problems:   Peripheral arterial occlusive disease (HCC)   Pulmonary hypertension associated with systemic disorder (HCC)   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HCC)   Scleroderma (HCC)   Chronic respiratory failure with hypoxia (HCC)   ILD (interstitial lung disease) (HCC)   AKI (acute kidney injury) ([place])   PAF (paroxysmal atrial fibrillation) (HCC)   Wound cellulitis  Celluitis of chronic wounds of bilateral lower extremity  pt presented with WBC of 22.8, lactate of 2.4 Previously had Enterococcus faecalis and Pseudomonas on wound culture Pt is continued on IV vancomycin and cefepime Patient has previously been seen by ID and was on a short course of IV cefepime but discontinued due to lack of response and that she is a chronic Pseudomonas colonizer.  Patient has failed treatment with wound care, plastic surgery and has already been optimized for her PAD.   Chart reviewed. Numerous specialists have recommended orthopedic referral for amputation, however patient had previously expressed wish against amputations Given complexity of case, have consulted ID for recs  Likely osteomyelitis of the left 4 and 5th digit MRI foot reviewed. Findings notable for abnormal edema distally in the prox  phalanx of the small toe suspicious for active osteomyelitis. Also findings suggesting cellulitis  Lactic acidosis Trending down with IVF   AKI on CKD stage 3 improving with fluids Repeat cmp in AM  Anemia of chronic disease/iron deficiency has hx of GI bleed nursing noted dark stool although pt also on iron supplementation-obtain FOBT  Hyponatremia Improving with fluids. -recheck bmet in AM  Interstitial lung disease with chronic hypoxemia Continue baseline O2 of 2 to 3 L Follows with outpatient pulmonology  Pulmonary hypertension continue on Revatio and macitentan Had been continued on torsemide  Scleroderma Continue chronic prednisone  PAD continue Plavix as tolerated  Paroxysmal atrial fibrillation Not on anticoagulation due to previous GI bleed  History of DVT Has IVC filter in place Seems to be stable at this time  Hyperlipidemia Off statin due to myalgia   DVT prophylaxis: Heparin subq Code Status: Full Family Communication: Pt in room, family at bedside  Status is: Inpatient  Remains inpatient appropriate because:IV treatments appropriate due to intensity of illness or inability to take PO and Inpatient level of care appropriate due to severity of illness   Dispo: The patient is from: Home              Anticipated d/c is to: Home              Patient currently is not medically stable to d/c.   Difficult to place patient No       Consultants:   ID  Procedures:     Antimicrobials: Anti-infectives (From admission, onward)   Start     Dose/Rate Route Frequency Ordered Stop   04/08/21 0600  vancomycin (VANCOREADY) IVPB 1000 mg/200 mL        1,000 mg 200 mL/hr over 60 Minutes Intravenous Every 36 hours 04/07/21 0059     04/07/21 0600  ceFEPIme (MAXIPIME) 2 g in sodium chloride 0.9 % 100 mL IVPB        2 g 200 mL/hr over 30 Minutes Intravenous Every 12 hours 04/07/21 0059     04/12/2021 1715  vancomycin (VANCOCIN) IVPB 1000  mg/200 mL premix        1,000 mg 200 mL/hr over 60 Minutes Intravenous  Once 04/27/2021 1711 04/15/2021 2045   04/12/2021 1715  ceFEPIme (MAXIPIME) 2 g in sodium chloride 0.9 % 100 mL IVPB        2 g 200 mL/hr over 30 Minutes Intravenous  Once 04/16/2021 1711 04/26/2021 1800       Subjective: Complains of LE pains  Objective: Vitals:   04/07/21 0240 04/07/21 0642 04/07/21 1126 04/07/21 1242  BP: (!) 140/57 (!) 139/56 (!) 95/48 (!) 95/49  Pulse: 89 (!) 104 84 79  Resp: _0 Temp: 99 F (37.2 C) 98.1 F (36.7 C) 97.9 F (36.6 C) 97.6 F (36.4 C)  TempSrc: Oral Oral Oral Oral  SpO2: 95%     Weight:      Height:        Intake/Output Summary (Last 24 hours) at 04/07/2021 1405 Last data filed at 04/07/2021 0456 Gross per 24 hour  Intake 0 ml  Output 650 ml  Net -650 ml   Filed Weights   04/05/2021 1502 04/24/2021 2300  Weight: 54 kg 53.9 kg    Examination: General exam: Awake, laying in bed, in nad Respiratory system: Normal respiratory effort, no wheezing Cardiovascular system: regular rate, s1, s2 Gastrointestinal system: Soft, nondistended, positive BS Central nervous system: CN2-12 grossly intact, strength intact Extremities: Perfused, no clubbing Skin: Normal skin turgor, BLE with dressings in place Psychiatry: Mood normal // no visual hallucinations    Data Reviewed: I have personally reviewed following labs and imaging studies  CBC: Recent Labs  Lab 04/10/2021 1539 04/07/21 0006  WBC 22.8* 18.3*  NEUTROABS 20.8*  --   HGB 8.0* 9.1*  HCT 25.3* 29.2*  MCV 96.9 99.7  PLT 364 094   Basic Metabolic Panel: Recent Labs  Lab 04/19/2021 1539 04/27/2021 1623 04/07/21 0006  NA 128*  --  130*  K 4.6  --  5.2*  CL 92*  --  94*  CO2 25  --  24  GLUCOSE 127*  --  90  BUN 49*  --  44*  CREATININE 1.27*  --  1.16*  CALCIUM 8.5*  --  8.4*  MG  --  2.0  --    GFR: Estimated Creatinine Clearance: 37.9 mL/min (A) (by C-G formula based on SCr of 1.16 mg/dL (H)). Liver  Function Tests: Recent Labs  Lab 04/26/2021 1539  AST 26  ALT 17  ALKPHOS 119  BILITOT 0.2*  PROT 6.4*  ALBUMIN 2.6*   No results for input(s): LIPASE, AMYLASE in the last 168 hours. No results for input(s): AMMONIA in the last 168 hours. Coagulation Profile: Recent Labs  Lab 03/31/2021 1539  INR 0.9   Cardiac Enzymes: No results for input(s): CKTOTAL, CKMB, CKMBINDEX, TROPONINI in the last 168 hours. BNP (last 3 results) Recent Labs    11/21/20 1439  PROBNP 526.0*   HbA1C: No results for input(s): HGBA1C in the last 72 hours. CBG: No results for input(s): GLUCAP in the last  168 hours. Lipid Profile: No results for input(s): CHOL, HDL, LDLCALC, TRIG, CHOLHDL, LDLDIRECT in the last 72 hours. Thyroid Function Tests: No results for input(s): TSH, T4TOTAL, FREET4, T3FREE, THYROIDAB in the last 72 hours. Anemia Panel: No results for input(s): VITAMINB12, FOLATE, FERRITIN, TIBC, IRON, RETICCTPCT in the last 72 hours. Sepsis Labs: Recent Labs  Lab 04/13/2021 1623 04/20/2021 2341 04/07/21 0204 04/07/21 0553  LATICACIDVEN 1.1 2.4* 2.4* 1.8    Recent Results (from the past 240 hour(s))  Blood Culture (routine x 2)     Status: None (Preliminary result)   Collection Time: 04/01/2021  4:23 PM   Specimen: BLOOD  Result Value Ref Range Status   Specimen Description   Final    BLOOD RIGHT ANTECUBITAL Performed at [facility], [place] [address], [place], [place] [address]    Special Requests   Final    BOTTLES DRAWN AEROBIC AND ANAEROBIC Blood Culture adequate volume Performed at [facility], [address] [place], [place] [address]    Culture   Final    NO GROWTH < 12 HOURS Performed at [facility] [address] [place], [place] [address]    Report Status PENDING  Incomplete  Blood Culture (routine x 2)     Status: None (Preliminary result)   Collection Time: 04/24/2021  4:23 PM   Specimen: BLOOD  Result Value Ref Range Status   Specimen  Description   Final    BLOOD LEFT ANTECUBITAL Performed at [facility], [place] [address] [place],  [address]    Special Requests   Final    BOTTLES DRAWN AEROBIC AND ANAEROBIC Blood Culture adequate volume Performed at [facility], [address] [place], [place] [address]    Culture   Final    NO GROWTH < 12 HOURS Performed at [facility] [address], [place],  [address]    Report Status PENDING  Incomplete  Resp Panel by RT-PCR (Flu A&B, Covid) Nasopharyngeal Swab     Status: None   Collection Time: 04/08/2021  8:00 PM   Specimen: Nasopharyngeal Swab; Nasopharyngeal(NP) swabs in vial transport medium  Result Value Ref Range Status   SARS Coronavirus 2 by RT PCR NEGATIVE NEGATIVE Final    Comment: (NOTE) SARS-CoV-2 target nucleic acids are NOT DETECTED.  The SARS-CoV-2 RNA is generally detectable in upper respiratory specimens during the acute phase of infection. The lowest concentration of SARS-CoV-2 viral copies this assay can detect is 138 copies/mL. A negative result does not preclude SARS-Cov-2 infection and should not be used as the sole basis for treatment or other patient management decisions. A negative result may occur with  improper specimen collection/handling, submission of specimen other than nasopharyngeal swab, presence of viral mutation(s) within the areas targeted by this assay, and inadequate number of viral copies(<138 copies/mL). A negative result must be combined with clinical observations, patient history, and epidemiological information. The expected result is Negative.  Fact Sheet for Patients:  EntrepreneurPulse.com.au  Fact Sheet for Healthcare Providers:  IncredibleEmployment.be  This test is no t yet approved or cleared by the [place] FDA and  has been authorized for detection and/or diagnosis of SARS-CoV-2 by FDA under an Emergency Use Authorization (EUA). This  EUA will remain  in effect (meaning this test can be used) for the duration of the COVID-19 declaration under Section 564(b)(1) of the Act, 21 U.S.C.section 360bbb-3(b)(1), unless the authorization is terminated  or revoked  sooner.       Influenza A by PCR NEGATIVE NEGATIVE Final   Influenza B by PCR NEGATIVE NEGATIVE Final    Comment: (NOTE) The Xpert Xpress SARS-CoV-2/FLU/RSV plus assay is intended as an aid in the diagnosis of influenza from Nasopharyngeal swab specimens and should not be used as a sole basis for treatment. Nasal washings and aspirates are unacceptable for Xpert Xpress SARS-CoV-2/FLU/RSV testing.  Fact Sheet for Patients: EntrepreneurPulse.com.au  Fact Sheet for Healthcare Providers: IncredibleEmployment.be  This test is not yet approved or cleared by the [place] FDA and has been authorized for detection and/or diagnosis of SARS-CoV-2 by FDA under an Emergency Use Authorization (EUA). This EUA will remain in effect (meaning this test can be used) for the duration of the COVID-19 declaration under Section 564(b)(1) of the Act, 21 U.S.C. section 360bbb-3(b)(1), unless the authorization is terminated or revoked.  Performed at [facility], [address] [place], [place] [address]      Radiology Studies: DG Tibia/Fibula Left  Result Date: 04/26/2021 CLINICAL DATA:  Wounds EXAM: LEFT TIBIA AND FIBULA - 2 VIEW COMPARISON:  None. FINDINGS: Diffuse vascular calcifications. No acute bony abnormality. Specifically, no fracture, subluxation, or dislocation. No bone destruction to suggest osteomyelitis. IMPRESSION: No acute bony abnormality. Electronically Signed   By: Rolm Baptise M.D.   On: 04/14/2021 19:54   DG Tibia/Fibula Right  Result Date: 04/24/2021 CLINICAL DATA:  Wounds EXAM: RIGHT TIBIA AND FIBULA - 2 VIEW COMPARISON:  None. FINDINGS: Dilated calcified vascular structure, likely calcified varicose veins.  Arterial calcifications. No acute bony abnormality. Specifically, no fracture, subluxation, or dislocation. No bone destruction. IMPRESSION: No acute bony abnormality. Electronically Signed   By: Rolm Baptise M.D.   On: 04/26/2021 19:57   DG Ankle Complete Left  Result Date: 04/13/2021 CLINICAL DATA:  Wounds EXAM: LEFT ANKLE COMPLETE - 3+ VIEW COMPARISON:  None. FINDINGS: Vascular calcifications. No acute bony abnormality. Specifically, no fracture, subluxation, or dislocation. No bone destruction. Soft tissues are intact. IMPRESSION: No acute bony abnormality. Electronically Signed   By: Rolm Baptise M.D.   On: 04/19/2021 19:55   DG Ankle Complete Right  Result Date: 04/07/2021 CLINICAL DATA:  Wounds EXAM: RIGHT ANKLE - COMPLETE 3+ VIEW COMPARISON:  None. FINDINGS: Diffuse vascular calcifications. Diffuse osteopenia. No acute bony abnormality. Specifically, no fracture, subluxation, or dislocation. No bone destruction. IMPRESSION: No acute bony abnormality. Electronically Signed   By: Rolm Baptise M.D.   On: 04/28/2021 19:59   CT Head Wo Contrast  Result Date: 04/12/2021 CLINICAL DATA:  70 year old female with altered mental status. EXAM: CT HEAD WITHOUT CONTRAST TECHNIQUE: Contiguous axial images were obtained from the base of the skull through the vertex without intravenous contrast. COMPARISON:  Head CT dated 02/05/2021. FINDINGS: Brain:Mild age-related atrophy and moderate chronic microvascular ischemic changes. Postsurgical changes of the right cerebellar lobe with an area of encephalomalacia. There is no acute intracranial hemorrhage. No mass effect or midline shift. No extra-axial fluid collection. Vascular: No hyperdense vessel or unexpected calcification. Skull: No acute calvarial pathology. Prior right occipital craniectomy. Sinuses/Orbits: Mild mucoperiosteal thickening of paranasal sinuses. There is perforation of the nasal septum. No air-fluid level. Left mastoid effusions. The right mastoid  air cells are clear. Other: None IMPRESSION: 1. No acute intracranial pathology. 2. Age-related atrophy and chronic microvascular ischemic changes. 3. Postsurgical changes of right cerebellar lobe with an area of encephalomalacia. 4. Left mastoid effusions. Electronically Signed   By: Laren Everts.D.  On: 04/02/2021 16:33   MR FOOT LEFT WO CONTRAST  Result Date: 04/07/2021 CLINICAL DATA:  Foot swelling. History of non-Hodgkin's lymphoma. Peripheral vascular disease. EXAM: MRI OF THE LEFT FOOT WITHOUT CONTRAST TECHNIQUE: Multiplanar, multisequence MR imaging of the left forefoot was performed. No intravenous contrast was administered. COMPARISON:  Radiographs 04/24/2021 FINDINGS: Despite efforts by the technologist and patient, severe motion artifact is present on today's exam and could not be eliminated. This reduces exam sensitivity and specificity. The patient terminated the exam prior to completion due to discomfort, and the sagittal series could not be obtained, and repeat attempts to compensate for the severe motion artifact were also curtailed. Bones/Joint/Cartilage There is abnormal marrow edema distally in the proximal phalanx of the small toe. Appearance concerning for osteomyelitis. On conventional radiography, there appear to be truncation of the fourth digit at the level of the distal metaphysis of the proximal phalanx, with nonvisualization of the middle and distal phalanges. In the absence of a prior amputation this probably reflects bony destruction from osteomyelitis. I do not see obvious edema signal in the residual proximal half of the proximal phalanx, although images must be understood to be severely limited due to the motion artifact. Hallux valgus noted. Ligaments Indeterminate due to motion Muscles and Tendons Low-level edema tracks along the forefoot musculature and is probably neurogenic. Soft tissues Subcutaneous edema dorsally in the forefoot is nonspecific. Cellulitis is not  excluded. IMPRESSION: 1. Severe motion artifact limits sensitivity and specificity, and truncated the exam. 2. Abnormal edema distally in the proximal phalanx of the small toe suspicious for active osteomyelitis. I do not observe definite edema in the remaining portion of the proximal phalanx of the fourth toe, with the understanding that the region is substantially blurred by motion artifact. On conventional radiography there is absence of the middle and distal phalanges of the fourth toe suspicious for bony destruction from osteomyelitis assuming no prior amputation. 3. Low-level edema in the forefoot musculature, probably neurogenic. 4. Dorsal subcutaneous edema in the forefoot could reflect cellulitis. Electronically Signed   By: Van Clines M.D.   On: 04/07/2021 09:57   DG Chest Port 1 View  Result Date: 04/28/2021 CLINICAL DATA:  70 year old female with sepsis. EXAM: PORTABLE CHEST 1 VIEW COMPARISON:  Chest radiograph dated 02/08/2021. FINDINGS: There is diffuse chronic interstitial coarsening and bronchitic changes. No focal consolidation, pleural effusion, or pneumothorax. There is cardiomegaly. Bilateral hilar prominence, likely enlarged pulmonary arteries and suggestive of pulmonary hypertension. Atherosclerotic calcification of the aortic arch. No acute osseous pathology. IMPRESSION: 1. No acute cardiopulmonary process. 2. Cardiomegaly. Electronically Signed   By: Anner Crete M.D.   On: 04/26/2021 16:35   DG Foot Complete Left  Result Date: 04/10/2021 CLINICAL DATA:  Wounds EXAM: LEFT FOOT - COMPLETE 3+ VIEW COMPARISON:  None. FINDINGS: Hallux valgus deformity. Diffuse osteopenia. No acute bony abnormality. Specifically, no fracture, subluxation, or dislocation. There appears to be bone destruction in the distal phalanx of the 4th and possibly 5th toes, age indeterminate. IMPRESSION: Apparent bone destruction in the distal phalanges of the left 4th and 5th toes, question chronicity.  Cannot exclude acute or chronic osteomyelitis. Electronically Signed   By: Rolm Baptise M.D.   On: 04/18/2021 19:56   DG Foot Complete Right  Result Date: 04/01/2021 CLINICAL DATA:  Wounds EXAM: RIGHT FOOT COMPLETE - 3+ VIEW COMPARISON:  None. 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 No bone destruction seen  to suggest acute osteomyelitis. Diffuse osteopenia. Hallux valgus deformity. No acute bony abnormality. Specifically, no fracture, subluxation, or dislocation. IMPRESSION: Chronic changes as above. No acute bony abnormality. Electronically Signed   By: Rolm Baptise M.D.   On: 04/02/2021 20:00    Scheduled Meds: . citalopram  20 mg Oral QHS  . clopidogrel  75 mg Oral Daily  . cycloSPORINE  2 drop Both Eyes BID  . folic acid  1 mg Oral Daily  . levothyroxine  50 mcg Oral Q0600  . macitentan  10 mg Oral Daily  . magnesium oxide  200 mg Oral BID  . multivitamin with minerals  1 tablet Oral Daily  . nutrition supplement (JUVEN)  1 packet Oral BID WC  . pantoprazole  40 mg Oral BID  . Ensure Max Protein  11 oz Oral Daily  . torsemide  20 mg Oral QHS  . torsemide  40 mg Oral Daily  . vitamin B-12  1,000 mcg Oral Daily   Continuous Infusions: . sodium chloride 50 mL/hr at 04/03/2021 1728  . ceFEPime (MAXIPIME) IV 2 g (04/07/21 0531)  . [START ON 04/08/2021] vancomycin       LOS: 1 day   Marylu Lund, MD [facility] Pager On Amion  If 7PM-7AM, please contact night-coverage 04/07/2021, 2:05 PM

## 2021-04-07 NOTE — Progress Notes (Signed)
   04/07/21 0020  Provider Notification  Provider Name/Title Ileene Musa  Date Provider Notified 04/07/21  Time Provider Notified 0020  Notification Type Face-to-face  Notification Reason Critical result (Lactic Acid 2.4 & small jet black stool)  Test performed and critical result Lactic Acid 2.4  Date Critical Result Received 04/07/21  Time Critical Result Received 0000 (Approximate. Another RN on the floor took critical on the phone)  Provider response At bedside (MD stated she would possibly give a bolus but later relized patient recieved bolus at [place] and said to start regular IVFs ordered)  Date of Provider Response 04/07/21  Time of Provider Response [phone]

## 2021-04-07 NOTE — Progress Notes (Addendum)
Patient brought back to unit. MRI technicians explained patient was unable to lay still due to pain. Patient received both Morphine 0.67m IV and Norco spread out, before going down for MRI.

## 2021-04-07 NOTE — Progress Notes (Signed)
Initial Nutrition Assessment  DOCUMENTATION CODES:   Underweight  INTERVENTION:   -Ensure MAX Protein po daily, each supplement provides 150 kcal and 30 grams of protein  -Juven Fruit Punch BID, each serving provides 95kcal and 2.5g of protein (amino acids glutamine and arginine)  -Multivitamin with minerals daily   NUTRITION DIAGNOSIS:   Increased nutrient needs related to wound healing as evidenced by estimated needs.  GOAL:   Patient will meet greater than or equal to 90% of their needs  MONITOR:   PO intake,Supplement acceptance,Labs,Weight trends,I & O's  REASON FOR ASSESSMENT:   Other (Comment) (Low BMI, wound)    ASSESSMENT:   70 y.o. 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  Pt with chronic wounds, ortho to see.  Pt currently on diet but was advanced only a few hours ago. No PO documented at this time. Will order supplements to aid in wound healing.   Per RN documentation, pt with moderate BLE edema.  Medications: Folic acid, MAG-OX, Vitamin B-12  Labs reviewed: Low Na Elevated K  NUTRITION - FOCUSED PHYSICAL EXAM:  Deferred.  Diet Order:   Diet Order            Diet Heart Room service appropriate? Yes; Fluid consistency: Thin  Diet effective now                 EDUCATION NEEDS:   No education needs have been identified at this time  Skin:  Skin Assessment: Skin Integrity Issues: Skin Integrity Issues:: Stage III,Other (Comment) Stage III: mid sacrum Other: left pre-tibial  venous stasis ulcer  Last BM:  4/7 -type 4  Height:   Ht Readings from Last 1 Encounters:  04/13/2021 5' 7.5" (1.715 m)    Weight:   Wt Readings from Last 1 Encounters:  04/13/2021 53.9 kg    BMI:  Body mass index is 18.34 kg/m.  Estimated Nutritional Needs:   Kcal:  1700-1900  Protein:  85-100g  Fluid:  1.9L/day  Clayton Bibles, MS, RD, LDN Inpatient Clinical Dietitian Contact information available via Amion

## 2021-04-07 NOTE — Progress Notes (Signed)
PHARMACY - PHYSICIAN COMMUNICATION CRITICAL VALUE ALERT - BLOOD CULTURE IDENTIFICATION (BCID)  [name] [name] is an 70 y.o. female who presented to [facility] on 04/15/2021 with a chief complaint of worsening chronic leg wounds  Assessment: 1/4 GNR, pseudomonas  Name of physician (or Provider) Contacted: [place]  Current antibiotics: cefepime and vancomycin  Changes to prescribed antibiotics recommended:  none  Results for orders placed or performed during the hospital encounter of 04/18/2021  Blood Culture ID Panel (Reflexed) (Collected: 04/23/2021  4:23 PM)  Result Value Ref Range   Enterococcus faecalis NOT DETECTED NOT DETECTED   Enterococcus Faecium NOT DETECTED NOT DETECTED   Listeria monocytogenes NOT DETECTED NOT DETECTED   Staphylococcus species NOT DETECTED NOT DETECTED   Staphylococcus aureus (BCID) NOT DETECTED NOT DETECTED   Staphylococcus epidermidis NOT DETECTED NOT DETECTED   Staphylococcus lugdunensis NOT DETECTED NOT DETECTED   Streptococcus species NOT DETECTED NOT DETECTED   Streptococcus agalactiae NOT DETECTED NOT DETECTED   Streptococcus pneumoniae NOT DETECTED NOT DETECTED   Streptococcus pyogenes NOT DETECTED NOT DETECTED   A.calcoaceticus-baumannii NOT DETECTED NOT DETECTED   Bacteroides fragilis NOT DETECTED NOT DETECTED   Enterobacterales NOT DETECTED NOT DETECTED   Enterobacter cloacae complex NOT DETECTED NOT DETECTED   Escherichia coli NOT DETECTED NOT DETECTED   Klebsiella aerogenes NOT DETECTED NOT DETECTED   Klebsiella oxytoca NOT DETECTED NOT DETECTED   Klebsiella pneumoniae NOT DETECTED NOT DETECTED   Proteus species NOT DETECTED NOT DETECTED   Salmonella species NOT DETECTED NOT DETECTED   Serratia marcescens NOT DETECTED NOT DETECTED   Haemophilus influenzae NOT DETECTED NOT DETECTED   Neisseria meningitidis NOT DETECTED NOT DETECTED   Pseudomonas aeruginosa DETECTED (A) NOT DETECTED   Stenotrophomonas maltophilia NOT DETECTED NOT DETECTED    Candida albicans NOT DETECTED NOT DETECTED   Candida auris NOT DETECTED NOT DETECTED   Candida glabrata NOT DETECTED NOT DETECTED   Candida krusei NOT DETECTED NOT DETECTED   Candida parapsilosis NOT DETECTED NOT DETECTED   Candida tropicalis NOT DETECTED NOT DETECTED   Cryptococcus neoformans/gattii NOT DETECTED NOT DETECTED   CTX-M ESBL NOT DETECTED NOT DETECTED   Carbapenem resistance IMP NOT DETECTED NOT DETECTED   Carbapenem resistance KPC NOT DETECTED NOT DETECTED   Carbapenem resistance NDM NOT DETECTED NOT DETECTED   Carbapenem resistance VIM NOT DETECTED NOT DETECTED    Dolly Rias RPh 04/07/2021, 11:34 PM

## 2021-04-07 NOTE — Progress Notes (Signed)
Pharmacy Antibiotic Note  [name] [name] is a 70 y.o. female admitted on 04/15/2021  presents to ED with altered mental statis. 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  Patient has lost most of the digits on her right hand secondary to scleroderma and now has bacterial infections in the toes of her feet secondary to this disease. Pharmacy has been consulted to dose vancomycin and cefepime for cellultis/wound infection.  Plan: Vancomycin 1 gm IV x 1(given earlier) then 1gm q36 (AUC 478, Scr 1.16, TBW) Cefepime 2gm q12h Follow renal function and clinical course  Height: 5' 8" (172.7 cm) Weight: 54 kg (119 lb) IBW/kg (Calculated) : 63.9  Temp (24hrs), Avg:98.6 F (37 C), Min:98.6 F (37 C), Max:98.6 F (37 C)  Recent Labs  Lab 04/04/2021 1539 04/03/2021 1623 04/23/2021 2341 04/07/21 0006  WBC 22.8*  --   --  18.3*  CREATININE 1.27*  --   --  1.16*  LATICACIDVEN  --  1.1 2.4*  --     Estimated Creatinine Clearance: 37.9 mL/min (A) (by C-G formula based on SCr of 1.16 mg/dL (H)).    Allergies  Allergen Reactions  . Levofloxacin Other (See Comments)    FLU LIKE SYMPTOM   . Sulfa Antibiotics Rash and Other (See Comments)    Face peeled, Severe rash with significant peeling of face. No airway involvement per patient   . Doxycycline Hyclate Swelling    SWELLING REACTION UNSPECIFIED ; "TABLETS ONLY - CAPSULES ARE TOLERATED FINE"  . Penicillins Rash    Has patient had a PCN reaction causing immediate rash, facial/tongue/throat swelling, SOB or lightheadedness with hypotension: no Has patient had a PCN reaction causing severe rash involving mucus membranes or skin necrosis: no Has patient had a PCN reaction that required hospitalization: no Has patient had a PCN reaction occurring within the last 10 years: {no If all of the above answers are "NO", then may proceed with Cephalosporin use.    Antimicrobials this  admission: 4/7 vanc >> 4/7 cefepime >> Dose adjustments this admission:   Microbiology results: 4/7 BCx:  4/7 UCx:    Thank you for allowing pharmacy to be a part of this patient's care.  Dolly Rias RPh 04/07/2021, 12:56 AM

## 2021-04-07 NOTE — Consult Note (Addendum)
[name] [name] for Infectious Disease    Date of Admission:  04/09/2021     Reason for Consult: sirs, sepsis, chronic open wound with pseudomonas colonization    Referring Provider: Wyline Copas   CC: Sirs/sepsis   Lines:  peripheral  Abx: 4/8-c vanc 4/8-c cefepime        Assessment/recs: 70 y.o. 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 Sundowning & polypharmacy in consideration. But given open wound reasonable to cover for BSI pending blood cx workup  -would resume her home prednisone 5 mg; doesn't appear she is in frank adrenal insufficiency at this time -continue vanc/cefepime pending culture; can stop on 4/10 if blood culture negative -she has no focal localizing sign of infection at this time -will follow peripherally for blood cx result over the weekend   #chronic open wound #crest #pvd #chronic steroid use Seen by multiple wound clinic and surgeons and also by ID previously. Chronic open wound due to crest/pvd/venous stasis/immunosuppression. 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 At risk for potential infection.  At this time wound clinically appears much better than when I saw her in clinic, with topical wound care from [facility]  -keep on above abx; if blood culture negative, would stop abx -continue supportive care with wound team -she can follow up with wound clinic on discharge   #mri finding left toe distal phalanx om This is likely overcall. She has pvd/crest and likely changes due to that Even if it is OM would watch and not treat at this  time  -as above, f/u blood cx result    I spent 60 minute reviewing data/chart, and coordinating care and >50% direct face to face time providing counseling/discussing diagnostics/treatment plan with patient   ------------------------------------------------ Principal Problem:   Cellulitis, leg Active Problems:   Peripheral arterial occlusive disease ([place])   Pulmonary hypertension associated with systemic disorder ([place])   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[facility])   Scleroderma ([place])   Chronic respiratory failure with hypoxia ([place])   ILD (interstitial lung disease) ([place])   AKI (acute kidney injury) ([place])   PAF (paroxysmal atrial fibrillation) ([place])   Wound cellulitis    HPI: [name] [name] is a 70 y.o. 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 (colonized with pseudomonas), recent admission for aki due to nausea/vomiting/diarrhea from cefepime (in 02/07/2021), readmitted 4/08 for sirs/ams  Patient familiar to me from outpatient/recent inpatient ID consult  From 01/2021 ID consult: --------------- Chronic bilateral LE wound most severe RLE since 2016 Seen by wound clinic Complicated by pulm htn/hypoxemic resp failure, venous stasis, crest syndrome Previous superficial cx mrsa in 2016 There is a biopsy of the wound 09/2015 "lipodermatosclerosis, without malignancy" Wound progressively worse in terms of size/depth by summer 2017 despite aggressive conservative care  08/2016 angiogram with right femoral endarterectomy/dacron patchy angioplasty Wound care had deemed patient "not candidate for skin grafting due to scleroderma/venous hypertension." 09/2020 repeated angioplasty right anterior tibial  artery and tibioperoneal trunk and right superficial femoral artery. subsequewntly angioplasty of left anterior  tibial artery and left tibial peroneal trunk 10/2020 acute right LE dvt. S/p ivc filter due to gib; xarelto stopped late 10/2020. 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 So referred to ID. She had been started on cefdinir. Compression dressing also applied but she had developed some ischemic distal changes. The pain/redness have been better.   She has plastic surgery outpatient f/u  I started her on iv cefepime 2 week course planned end of 12/2020 for her open wounds in LE (worst on right LE) that seems to have clinical suggestion of active SSI while awaiting surgery. I did discuss with patient that the antibiotics are unlikely to help as she had this issue chronically. She again was offerred amputation in the past but wasn't ready ---------------------  New history: It appeared during the 01/2021 admissions the exam finding of her rle and other open wound remained the same as I saw her in clinic. She ultimately underwent I&D and mesh graft on 2/10 by plastic surgery. Her aki recovered with supportive care. It was obvious that the cefepime only caused iatrogenic dehydration/aki rather than helping the open wound. No antibiotics were given during that admission  She followed up with me in clinic on 2/21 with again stable wound appearance and as expected elevated inflammatory markers, however the graft failed. I agreed with plastic surgery clinic's observation that she her chronic greenish discharge from wound was due to pseudomonas colonization  She had seen [facility] wound [facility] recently 03/29/2021. Noted to still refuse amputation. There is some plan for repeat ABI study. The wound care again was  pessimistic in terms of any possible meaningful recovery and agrees likelihood of her needing amputation is rather substantial.  Patient has brief confusion and was brought in on 4/7-8 No f/c Wound looks same per her  I discussed with patient and  Her husband the history. Patient actually had done well outpatient and new wound clinic. With the topical care she received, the swelling of the LE had improved and there is not much greenish discharge.  On the day of admission she was talking randomly and appeared confused for the morning. By the time she was taken to the hospital this had resolved and she was back to baseline  According to the husband, patient does this now and then Again no n/v/diarrhea, cough, chest pain, headache, sob, abd pain, dysuria  Wbc high 18. No fever She remains on her chronic low dose prednisone 5 mg daily and cyclosporin Blood culture is in process. On empiric vanc/cefepime Chest xray negative Multiple imaging including left foot mri as below possible small toe OM per imaging.      Family History  Problem Relation Age of Onset  . Lupus Father   . Lymphoma Father   . Hyperlipidemia Mother   . Cancer Daughter        sarcoma  . Colon cancer Other     Social History   Tobacco Use  . Smoking status: Never Smoker  . Smokeless tobacco: Never Used  Vaping Use  . Vaping Use: Never used  Substance Use Topics  . Alcohol use: Yes    Alcohol/week: 7.0  standard drinks    Types: 4 Glasses of wine, 3 Standard drinks or equivalent per week    Comment: weekly  . Drug use: No    Allergies  Allergen Reactions  . Levofloxacin Other (See Comments)    FLU LIKE SYMPTOM   . Sulfa Antibiotics Rash and Other (See Comments)    Face peeled, Severe rash with significant peeling of face. No airway involvement per patient   . Doxycycline Hyclate Swelling    SWELLING REACTION UNSPECIFIED ; "TABLETS ONLY - CAPSULES ARE TOLERATED FINE"  . Penicillins Rash    Has  patient had a PCN reaction causing immediate rash, facial/tongue/throat swelling, SOB or lightheadedness with hypotension: no Has patient had a PCN reaction causing severe rash involving mucus membranes or skin necrosis: no Has patient had a PCN reaction that required hospitalization: no Has patient had a PCN reaction occurring within the last 10 years: {no If all of the above answers are "NO", then may proceed with Cephalosporin use.    Review of Systems: ROS All Other ROS was negative, except mentioned above   Past Medical History:  Diagnosis Date  . Anemia   . Arthritis   . CHF (congestive heart failure) ([place])   . DOE (dyspnea on exertion)   . Epistaxis   . History of chicken pox   . Hypertension   . Hypothyroidism   . Leg ulcer ([place])   . Melanoma ([place]) 1999  . 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[place])   . 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  Marland Kitchen PAH (pulmonary artery hypertension) ([place])   . Peripheral arterial occlusive disease ([place])   . Pneumonia 2008  . Pulmonary hypertension ([place])   . Requires supplemental oxygen    2 liters-3liters when moving  . 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 citalopram  20 mg Oral QHS  . clopidogrel  75 mg Oral Daily  . cycloSPORINE  2 drop Both Eyes BID  . folic acid  1 mg Oral Daily  . heparin  5,000 Units Subcutaneous Q8H  . levothyroxine  50 mcg Oral Q0600  . macitentan  10 mg Oral Daily  . magnesium oxide  200 mg Oral BID  . multivitamin with minerals  1 tablet Oral Daily  . nutrition supplement (JUVEN)  1 packet Oral BID WC  . pantoprazole  40 mg Oral BID  . Ensure Max Protein  11 oz Oral Daily  . torsemide  20 mg Oral QHS  . torsemide  40 mg Oral Daily  . vitamin B-12  1,000 mcg Oral Daily   Continuous Infusions: . sodium chloride 50 mL/hr at 04/23/2021 1728  . ceFEPime (MAXIPIME) IV 2 g (04/07/21 0531)  . [START ON 04/08/2021] vancomycin     PRN Meds:.sodium chloride,  HYDROmorphone (DILAUDID) injection, morphine injection, oxyCODONE-acetaminophen   OBJECTIVE: Blood pressure (!) 95/49, pulse 79, temperature 97.6 F (36.4 C), temperature source Oral, resp. rate 18, height 5' 7.5" (1.715 m), weight 53.9 kg, SpO2 95 %.  Physical Exam General/constitutional: no distress, pleasant, nontoxic HEENT: Normocephalic, PER, Conj Clear, EOMI, Oropharynx clear Neck supple CV: rrr no mrg Lungs: clear to auscultation, normal respiratory effort Abd: Soft, Nontender Ext/skin: full thickness wound on rle with green discharge no surrounding erythema; bilateral LE with brawny skin changes but much better swelling than baseline Msk: s/p partial several fingers ampuation in the past.  Neuro nonfocal, strength 4/5 symmetric Psych alert/oriented  Lab  Results Lab Results  Component Value Date   WBC 18.3 (H) 04/07/2021   HGB 9.1 (L) 04/07/2021   HCT 29.2 (L) 04/07/2021   MCV 99.7 04/07/2021   PLT 365 04/07/2021    Lab Results  Component Value Date   CREATININE 1.16 (H) 04/07/2021   BUN 44 (H) 04/07/2021   NA 130 (L) 04/07/2021   K 5.2 (H) 04/07/2021   CL 94 (L) 04/07/2021   CO2 24 04/07/2021    Lab Results  Component Value Date   ALT 17 04/14/2021   AST 26 04/18/2021   ALKPHOS 119 04/03/2021   BILITOT 0.2 (L) 04/28/2021      Microbiology: Recent Results (from the past 240 hour(s))  Blood Culture (routine x 2)     Status: None (Preliminary result)   Collection Time: 04/13/2021  4:23 PM   Specimen: BLOOD  Result Value Ref Range Status   Specimen Description   Final    BLOOD RIGHT ANTECUBITAL Performed at [facility], [place] [address], [place], [place] [address]    Special Requests   Final    BOTTLES DRAWN AEROBIC AND ANAEROBIC Blood Culture adequate volume Performed at [facility], [address] [place], [place] [address]    Culture   Final    NO GROWTH < 12 HOURS Performed at [facility] [address], [place],  [place] [address]    Report Status PENDING  Incomplete  Blood Culture (routine x 2)     Status: None (Preliminary result)   Collection Time: 04/01/2021  4:23 PM   Specimen: BLOOD  Result Value Ref Range Status   Specimen Description   Final    BLOOD LEFT ANTECUBITAL Performed at [facility], [place] [address] [place], [place] [address]    Special Requests   Final    BOTTLES DRAWN AEROBIC AND ANAEROBIC Blood Culture adequate volume Performed at [facility], [address] [place], [place] [address]    Culture   Final    NO GROWTH < 12 HOURS Performed at [facility] [address], [place], [place] [address]    Report Status PENDING  Incomplete  Resp Panel by RT-PCR (Flu A&B, Covid) Nasopharyngeal Swab     Status: None   Collection Time: 04/05/2021  8:00 PM   Specimen: Nasopharyngeal Swab; Nasopharyngeal(NP) swabs in vial transport medium  Result Value Ref Range Status   SARS Coronavirus 2 by RT PCR NEGATIVE NEGATIVE Final    Comment: (NOTE) SARS-CoV-2 target nucleic acids are NOT DETECTED.  The SARS-CoV-2 RNA is generally detectable in upper respiratory specimens during the acute phase of infection. The lowest concentration of SARS-CoV-2 viral copies this assay can detect is 138 copies/mL. A negative result does not preclude SARS-Cov-2 infection and should not be used as the sole basis for treatment or other patient management decisions. A negative result may occur with  improper specimen collection/handling, submission of specimen other than nasopharyngeal swab, presence of viral mutation(s) within the areas targeted by this assay, and inadequate number of viral copies(<138 copies/mL). A negative result must be combined with clinical observations, patient history, and epidemiological information. The expected result is Negative.  Fact Sheet for Patients:  EntrepreneurPulse.com.au  Fact Sheet for Healthcare Providers:   IncredibleEmployment.be  This test is no t yet approved or cleared by the [place] FDA and  has been authorized for detection and/or diagnosis of SARS-CoV-2 by FDA under an Emergency Use  Authorization (EUA). This EUA will remain  in effect (meaning this test can be used) for the duration of the COVID-19 declaration under Section 564(b)(1) of the Act, 21 U.S.C.section 360bbb-3(b)(1), unless the authorization is terminated  or revoked sooner.       Influenza A by PCR NEGATIVE NEGATIVE Final   Influenza B by PCR NEGATIVE NEGATIVE Final    Comment: (NOTE) The Xpert Xpress SARS-CoV-2/FLU/RSV plus assay is intended as an aid in the diagnosis of influenza from Nasopharyngeal swab specimens and should not be used as a sole basis for treatment. Nasal washings and aspirates are unacceptable for Xpert Xpress SARS-CoV-2/FLU/RSV testing.  Fact Sheet for Patients: EntrepreneurPulse.com.au  Fact Sheet for Healthcare Providers: IncredibleEmployment.be  This test is not yet approved or cleared by the [place] FDA and has been authorized for detection and/or diagnosis of SARS-CoV-2 by FDA under an Emergency Use Authorization (EUA). This EUA will remain in effect (meaning this test can be used) for the duration of the COVID-19 declaration under Section 564(b)(1) of the Act, 21 U.S.C. section 360bbb-3(b)(1), unless the authorization is terminated or revoked.  Performed at [facility], [address], [place], [place] [address]      Serology:    Imaging: If present, new imagings (plain films, ct scans, and mri) have been personally visualized and interpreted; radiology reports have been reviewed. Decision making incorporated into the Impression / Recommendations.  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 No bone destruction seen to suggest acute osteomyelitis. Diffuse osteopenia. Hallux valgus deformity. No acute bony abnormality. Specifically, no fracture, subluxation, or dislocation.  4/08 mri left foot 1. Severe motion artifact limits sensitivity and specificity, and truncated the exam. 2. Abnormal edema distally in the proximal phalanx of the small toe suspicious for active osteomyelitis. I do not observe definite edema in the remaining portion of the proximal phalanx of the fourth toe, with the understanding that the region is substantially blurred by motion artifact. On conventional radiography there is absence of the middle and distal phalanges of the fourth toe suspicious for bony destruction from osteomyelitis assuming no prior amputation. 3. Low-level edema in the forefoot musculature, probably neurogenic. 4. Dorsal subcutaneous edema in the forefoot could reflect cellulitis.  Jabier Mutton, [place] for [facility]ious [place] [phone] pager    04/07/2021, 2:28 PM

## 2021-04-07 NOTE — Progress Notes (Signed)
Patient's oxygen readings in 80s, STARTED ON 2L Nasal cannula to increase same , increase to 90% standing order is to maintain O2 >92%, on call notified.

## 2021-04-08 DIAGNOSIS — N179 Acute kidney failure, unspecified: Secondary | ICD-10-CM | POA: Diagnosis not present

## 2021-04-08 DIAGNOSIS — J9611 Chronic respiratory failure with hypoxia: Secondary | ICD-10-CM

## 2021-04-08 DIAGNOSIS — A498 Other bacterial infections of unspecified site: Secondary | ICD-10-CM

## 2021-04-08 DIAGNOSIS — I2729 Other secondary pulmonary hypertension: Secondary | ICD-10-CM

## 2021-04-08 DIAGNOSIS — I779 Disorder of arteries and arterioles, unspecified: Secondary | ICD-10-CM

## 2021-04-08 DIAGNOSIS — L03115 Cellulitis of right lower limb: Secondary | ICD-10-CM | POA: Diagnosis not present

## 2021-04-08 DIAGNOSIS — L97502 Non-pressure chronic ulcer of other part of unspecified foot with fat layer exposed: Secondary | ICD-10-CM

## 2021-04-08 DIAGNOSIS — M349 Systemic sclerosis, unspecified: Secondary | ICD-10-CM

## 2021-04-08 DIAGNOSIS — J849 Interstitial pulmonary disease, unspecified: Secondary | ICD-10-CM

## 2021-04-08 LAB — COMPREHENSIVE METABOLIC PANEL
ALT: 52 U/L — ABNORMAL HIGH (ref 0–44)
AST: 92 U/L — ABNORMAL HIGH (ref 15–41)
Albumin: 2.1 g/dL — ABNORMAL LOW (ref 3.5–5.0)
Alkaline Phosphatase: 113 U/L (ref 38–126)
Anion gap: 15 (ref 5–15)
BUN: 45 mg/dL — ABNORMAL HIGH (ref 8–23)
CO2: 21 mmol/L — ABNORMAL LOW (ref 22–32)
Calcium: 8.3 mg/dL — ABNORMAL LOW (ref 8.9–10.3)
Chloride: 98 mmol/L (ref 98–111)
Creatinine, Ser: 1.24 mg/dL — ABNORMAL HIGH (ref 0.44–1.00)
GFR, Estimated: 47 mL/min — ABNORMAL LOW (ref >60)
Glucose, Bld: 76 mg/dL (ref 70–99)
Potassium: 4.3 mmol/L (ref 3.5–5.1)
Sodium: 134 mmol/L — ABNORMAL LOW (ref 135–145)
Total Bilirubin: 0.4 mg/dL (ref 0.3–1.2)
Total Protein: 5.7 g/dL — ABNORMAL LOW (ref 6.5–8.1)

## 2021-04-08 LAB — URINE CULTURE: Culture: NO GROWTH

## 2021-04-08 LAB — CBC
HCT: 22.6 % — ABNORMAL LOW (ref 36.0–46.0)
HCT: 23.3 % — ABNORMAL LOW (ref 36.0–46.0)
Hemoglobin: 7 g/dL — ABNORMAL LOW (ref 12.0–15.0)
Hemoglobin: 7.2 g/dL — ABNORMAL LOW (ref 12.0–15.0)
MCH: 30.3 pg (ref 26.0–34.0)
MCH: 30.4 pg (ref 26.0–34.0)
MCHC: 31 g/dL (ref 30.0–36.0)
MCHC: 30.9 g/dL (ref 30.0–36.0)
MCV: 97.8 fL (ref 80.0–100.0)
MCV: 98.3 fL (ref 80.0–100.0)
Platelets: 408 10*3/uL — ABNORMAL HIGH (ref 150–400)
Platelets: 341 10*3/uL (ref 150–400)
RBC: 2.31 MIL/uL — ABNORMAL LOW (ref 3.87–5.11)
RBC: 2.37 MIL/uL — ABNORMAL LOW (ref 3.87–5.11)
RDW: 19.2 % — ABNORMAL HIGH (ref 11.5–15.5)
RDW: 19.3 % — ABNORMAL HIGH (ref 11.5–15.5)
WBC: 15.8 10*3/uL — ABNORMAL HIGH (ref 4.0–10.5)
WBC: 16.7 10*3/uL — ABNORMAL HIGH (ref 4.0–10.5)
nRBC: 0 % (ref 0.0–0.2)
nRBC: 0 % (ref 0.0–0.2)

## 2021-04-08 MED ORDER — PANTOPRAZOLE SODIUM 40 MG IV SOLR
40.0000 mg | Freq: Once | INTRAVENOUS | Status: AC
  Administered 2021-04-08: 40 mg via INTRAVENOUS
  Filled 2021-04-08: qty 40

## 2021-04-08 NOTE — Progress Notes (Signed)
Pt. moaning in pain at this time and moving legs constantly. Patient's husband Juanda Crumble did not want her taking anything other than tylenol, but was notified because patient unable to take PO meds at this time.  Also, it was not quite time for tylenol.  Juanda Crumble (husband) agreed to giving patient ordered dilaudid. Will continue to monitor patient.Roderick Pee

## 2021-04-08 NOTE — Progress Notes (Signed)
PROGRESS NOTE    [name] [name]  IRW:[id number] DOB: 03-27-1951 DOA: 04/04/2021 PCP: [name], MD    Brief Narrative:  70 y.o. 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   Assessment & Plan:   Principal Problem:   Cellulitis, leg Active Problems:   Peripheral arterial occlusive disease (HCC)   Pulmonary hypertension associated with systemic disorder (HCC)   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HCC)   Scleroderma (HCC)   Chronic respiratory failure with hypoxia (HCC)   ILD (interstitial lung disease) (HCC)   AKI (acute kidney injury) ([place])   PAF (paroxysmal atrial fibrillation) (HCC)   Wound cellulitis   SIRS (systemic inflammatory response syndrome) (HCC)  Celluitis of chronic wounds of bilateral lower extremity  pt presented with WBC of 22.8, lactate of 2.4 Previously had Enterococcus faecalis and Pseudomonas on wound culture Pt was initially started on IV vancomycin and cefepime Patient has previously been seen by ID and was on a short course of IV cefepime but discontinued due to lack of response and that she is a chronic Pseudomonas colonizer.  Patient has failed treatment with wound care, plastic surgery and has already been optimized for her PAD.   Chart reviewed. Numerous specialists have recommended orthopedic referral for amputation, however patient had previously expressed wish against amputations Given complexity of case, have consulted ID for recs, appreciate recs Blood cultures are pos for Pseudomonas. Discussed  with ID. Have d/c vanc  Likely osteomyelitis of the left 4 and 5th digit MRI foot reviewed. Findings notable for abnormal edema distally in the prox phalanx of the small toe suspicious for active osteomyelitis.  -Discussed with ID who is familiar with pt. Rec to hold off on surgical consult and to continue with wound care as per [facility] specialists  Lactic acidosis Noted to have trended down with IVF  AKI on CKD stage 3 improving with fluids Recheck cmp in AM  Anemia of chronic disease/iron deficiency has hx of GI bleed nursing noted dark stool although pt also on iron supplementation-obtain FOBT  Hyponatremia Improving with fluids. -Repeat bmet in AM  Interstitial lung disease with chronic hypoxemia Continue baseline O2 of 2 to 3 L Follows with outpatient pulmonology  Pulmonary hypertension continue on Revatio and macitentan Had been continued on torsemide  Scleroderma Continue chronic prednisone  PAD Continued with Plavix as tolerated  Paroxysmal atrial fibrillation Not on anticoagulation due to previous GI bleed  History of DVT Has IVC filter in place Seems to be stable at this time  Hyperlipidemia Off statin due to myalgia  DVT prophylaxis: Heparin subq Code Status: Full Family Communication: Pt in room, family at bedside  Status is: Inpatient  Remains inpatient appropriate because:IV treatments appropriate due to intensity of illness or inability to take PO and Inpatient level of care appropriate due to severity of illness   Dispo: The patient is from: Home              Anticipated d/c is to: Home              Patient currently is not medically  stable to d/c.   Difficult to place patient No   Consultants:   ID  Procedures:     Antimicrobials: Anti-infectives (From admission, onward)   Start     Dose/Rate Route Frequency Ordered Stop   04/08/21 0600  vancomycin (VANCOREADY) IVPB 1000 mg/200 mL  Status:  Discontinued        1,000  mg 200 mL/hr over 60 Minutes Intravenous Every 36 hours 04/07/21 0059 04/08/21 0828   04/07/21 0600  ceFEPIme (MAXIPIME) 2 g in sodium chloride 0.9 % 100 mL IVPB        2 g 200 mL/hr over 30 Minutes Intravenous Every 12 hours 04/07/21 0059     03/31/2021 1715  vancomycin (VANCOCIN) IVPB 1000 mg/200 mL premix        1,000 mg 200 mL/hr over 60 Minutes Intravenous  Once 04/19/2021 1711 04/01/2021 2045   04/15/2021 1715  ceFEPIme (MAXIPIME) 2 g in sodium chloride 0.9 % 100 mL IVPB        2 g 200 mL/hr over 30 Minutes Intravenous  Once 04/23/2021 1711 04/22/2021 1800      Subjective: Still complaining of LE pains  Objective: Vitals:   04/07/21 2045 04/07/21 2119 04/08/21 0608 04/08/21 1437  BP:  (!) 117/51 129/63 (!) 145/74  Pulse:  88 90 96  Resp:  16 (!) 22 18  Temp:  97.6 F (36.4 C) 98.5 F (36.9 C) 98.2 F (36.8 C)  TempSrc:  Oral Oral Oral  SpO2: 90% 92% 91% (!) 88%  Weight:      Height:        Intake/Output Summary (Last 24 hours) at 04/08/2021 1515 Last data filed at 04/08/2021 6301 Gross per 24 hour  Intake 387.87 ml  Output 1375 ml  Net -987.13 ml   Filed Weights   04/18/2021 1502 04/24/2021 2300  Weight: 54 kg 53.9 kg    Examination: General exam: Conversant, in no acute distress Respiratory system: normal chest rise, clear, no audible wheezing Cardiovascular system: regular rhythm, s1-s2 Gastrointestinal system: Nondistended, nontender, pos BS Central nervous system: No seizures, no tremors Extremities: No cyanosis, no joint deformities Skin: LE dressings in place Psychiatry: Affect normal // no auditory hallucinations   Data Reviewed: I have personally reviewed following labs and imaging studies  CBC: Recent Labs  Lab 04/08/2021 1539 04/07/21 0006 04/08/21 0523 04/08/21 1234  WBC 22.8* 18.3* 15.8* 16.7*  NEUTROABS 20.8*  --   --   --   HGB 8.0* 9.1* 7.0* 7.2*  HCT 25.3* 29.2* 22.6* 23.3*  MCV 96.9 99.7 97.8 98.3  PLT 364 365 408* 601   Basic Metabolic  Panel: Recent Labs  Lab 03/31/2021 1539 04/20/2021 1623 04/07/21 0006 04/08/21 0523  NA 128*  --  130* 134*  K 4.6  --  5.2* 4.3  CL 92*  --  94* 98  CO2 25  --  24 21*  GLUCOSE 127*  --  90 76  BUN 49*  --  44* 45*  CREATININE 1.27*  --  1.16* 1.24*  CALCIUM 8.5*  --  8.4* 8.3*  MG  --  2.0  --   --    GFR: Estimated Creatinine Clearance: 35.4 mL/min (A) (by C-G formula based on SCr of 1.24 mg/dL (H)). Liver Function Tests: Recent Labs  Lab 04/10/2021 1539 04/08/21 0523  AST 26 92*  ALT 17 52*  ALKPHOS 119 113  BILITOT 0.2* 0.4  PROT 6.4* 5.7*  ALBUMIN 2.6* 2.1*   No results for input(s):  LIPASE, AMYLASE in the last 168 hours. No results for input(s): AMMONIA in the last 168 hours. Coagulation Profile: Recent Labs  Lab 04/12/2021 1539  INR 0.9   Cardiac Enzymes: No results for input(s): CKTOTAL, CKMB, CKMBINDEX, TROPONINI in the last 168 hours. BNP (last 3 results) Recent Labs    11/21/20 1439  PROBNP 526.0*   HbA1C: No results for input(s): HGBA1C in the last 72 hours. CBG: No results for input(s): GLUCAP in the last 168 hours. Lipid Profile: No results for input(s): CHOL, HDL, LDLCALC, TRIG, CHOLHDL, LDLDIRECT in the last 72 hours. Thyroid Function Tests: No results for input(s): TSH, T4TOTAL, FREET4, T3FREE, THYROIDAB in the last 72 hours. Anemia Panel: No results for input(s): VITAMINB12, FOLATE, FERRITIN, TIBC, IRON, RETICCTPCT in the last 72 hours. Sepsis Labs: Recent Labs  Lab 04/08/2021 1623 04/18/2021 2341 04/07/21 0204 04/07/21 0553  LATICACIDVEN 1.1 2.4* 2.4* 1.8    Recent Results (from the past 240 hour(s))  Blood Culture (routine x 2)     Status: None (Preliminary result)   Collection Time: 04/18/2021  4:23 PM   Specimen: BLOOD  Result Value Ref Range Status   Specimen Description   Final    BLOOD RIGHT ANTECUBITAL Performed at [facility], [place] [address], [place], [place] [address]    Special Requests   Final    BOTTLES DRAWN  AEROBIC AND ANAEROBIC Blood Culture adequate volume Performed at [facility], [address] [place], [place] [address]    Culture  Setup Time   Final    GRAM NEGATIVE RODS AEROBIC BOTTLE ONLY Organism ID to follow CRITICAL RESULT CALLED TO, READ BACK BY AND VERIFIED WITHSeleta Rhymes [facility] [id number] 04/07/21 A BROWNING Performed at [facility], [place] [address], [place], [place] [address]    Culture GRAM NEGATIVE RODS  Final   Report Status PENDING  Incomplete  Blood Culture (routine x 2)     Status: None (Preliminary result)   Collection Time: 04/15/2021  4:23 PM   Specimen: BLOOD  Result Value Ref Range Status   Specimen Description   Final    BLOOD LEFT ANTECUBITAL Performed at [facility], [place] [address], [place], [place] [address]    Special Requests   Final    BOTTLES DRAWN AEROBIC AND ANAEROBIC Blood Culture adequate volume Performed at [facility], [address] [place], [place] [address]    Culture   Final    NO GROWTH 2 DAYS Performed at [facility] [facility], [place] [address] [place], [place] [address]    Report Status PENDING  Incomplete  Blood Culture ID Panel (Reflexed)     Status: Abnormal   Collection Time: 04/29/2021  4:23 PM  Result Value Ref Range Status   Enterococcus faecalis NOT DETECTED NOT DETECTED Final   Enterococcus Faecium NOT DETECTED NOT DETECTED Final   Listeria monocytogenes NOT DETECTED NOT DETECTED Final   Staphylococcus species NOT DETECTED NOT DETECTED Final   Staphylococcus aureus (BCID) NOT DETECTED NOT DETECTED Final   Staphylococcus epidermidis NOT DETECTED NOT DETECTED Final   Staphylococcus lugdunensis NOT DETECTED NOT DETECTED Final   Streptococcus species NOT DETECTED NOT DETECTED Final   Streptococcus agalactiae NOT DETECTED NOT DETECTED Final   Streptococcus pneumoniae NOT DETECTED NOT DETECTED Final   Streptococcus pyogenes NOT DETECTED NOT DETECTED Final   A.calcoaceticus-baumannii NOT DETECTED NOT  DETECTED Final   Bacteroides fragilis NOT DETECTED NOT DETECTED Final   Enterobacterales  NOT DETECTED NOT DETECTED Final   Enterobacter cloacae complex NOT DETECTED NOT DETECTED Final   Escherichia coli NOT DETECTED NOT DETECTED Final   Klebsiella aerogenes NOT DETECTED NOT DETECTED Final   Klebsiella oxytoca NOT DETECTED NOT DETECTED Final   Klebsiella pneumoniae NOT DETECTED NOT DETECTED Final   Proteus species NOT DETECTED NOT DETECTED Final   Salmonella species NOT DETECTED NOT DETECTED Final   Serratia marcescens NOT DETECTED NOT DETECTED Final   Haemophilus influenzae NOT DETECTED NOT DETECTED Final   Neisseria meningitidis NOT DETECTED NOT DETECTED Final   Pseudomonas aeruginosa DETECTED (A) NOT DETECTED Final    Comment: CRITICAL RESULT CALLED TO, READ BACK BY AND VERIFIED WITH: Seleta Rhymes PHARMD [id number] 04/07/21 A BROWNING    Stenotrophomonas maltophilia NOT DETECTED NOT DETECTED Final   Candida albicans NOT DETECTED NOT DETECTED Final   Candida auris NOT DETECTED NOT DETECTED Final   Candida glabrata NOT DETECTED NOT DETECTED Final   Candida krusei NOT DETECTED NOT DETECTED Final   Candida parapsilosis NOT DETECTED NOT DETECTED Final   Candida tropicalis NOT DETECTED NOT DETECTED Final   Cryptococcus neoformans/gattii NOT DETECTED NOT DETECTED Final   CTX-M ESBL NOT DETECTED NOT DETECTED Final   Carbapenem resistance IMP NOT DETECTED NOT DETECTED Final   Carbapenem resistance KPC NOT DETECTED NOT DETECTED Final   Carbapenem resistance NDM NOT DETECTED NOT DETECTED Final   Carbapenem resistance VIM NOT DETECTED NOT DETECTED Final    Comment: Performed at [facility], [address], [place], [place] [address]  Urine culture     Status: None   Collection Time: 03/31/2021  4:26 PM   Specimen: Urine, Random  Result Value Ref Range Status   Specimen Description   Final    URINE, RANDOM Performed at [facility], [address] [place], [place] [address]     Special Requests   Final    NONE Performed at [facility], [address] [place], [place] [address]    Culture   Final    NO GROWTH Performed at [facility], [place] [address], [place], [place] [address]    Report Status 04/08/2021 FINAL  Final  Resp Panel by RT-PCR (Flu A&B, Covid) Nasopharyngeal Swab     Status: None   Collection Time: 04/13/2021  8:00 PM   Specimen: Nasopharyngeal Swab; Nasopharyngeal(NP) swabs in vial transport medium  Result Value Ref Range Status   SARS Coronavirus 2 by RT PCR NEGATIVE NEGATIVE Final    Comment: (NOTE) SARS-CoV-2 target nucleic acids are NOT DETECTED.  The SARS-CoV-2 RNA is generally detectable in upper respiratory specimens during the acute phase of infection. The lowest concentration of SARS-CoV-2 viral copies this assay can detect is 138 copies/mL. A negative result does not preclude SARS-Cov-2 infection and should not be used as the sole basis for treatment or other patient management decisions. A negative result may occur with  improper specimen collection/handling, submission of specimen other than nasopharyngeal swab, presence of viral mutation(s) within the areas targeted by this assay, and inadequate number of viral copies(<138 copies/mL). A negative result must be combined with clinical observations, patient history, and epidemiological information. The expected result is Negative.  Fact Sheet for Patients:  EntrepreneurPulse.com.au  Fact Sheet for Healthcare Providers:  IncredibleEmployment.be  This test is no t yet approved or cleared by the [place] FDA and  has been authorized for detection and/or diagnosis of SARS-CoV-2 by FDA under an Emergency Use  Authorization (EUA). This EUA will remain  in effect (meaning this test can be used) for the duration of the COVID-19 declaration under Section 564(b)(1) of the Act, 21 U.S.C.section 360bbb-3(b)(1), unless the  authorization is terminated  or revoked sooner.       Influenza A by PCR NEGATIVE NEGATIVE Final   Influenza B by PCR NEGATIVE NEGATIVE Final    Comment: (NOTE) The Xpert Xpress SARS-CoV-2/FLU/RSV plus assay is intended as an aid in the diagnosis of influenza from Nasopharyngeal swab specimens and should not be used as a sole basis for treatment. Nasal washings and aspirates are unacceptable for Xpert Xpress SARS-CoV-2/FLU/RSV testing.  Fact Sheet for Patients: EntrepreneurPulse.com.au  Fact Sheet for Healthcare Providers: IncredibleEmployment.be  This test is not yet approved or cleared by the [place] FDA and has been authorized for detection and/or diagnosis of SARS-CoV-2 by FDA under an Emergency Use Authorization (EUA). This EUA will remain in effect (meaning this test can be used) for the duration of the COVID-19 declaration under Section 564(b)(1) of the Act, 21 U.S.C. section 360bbb-3(b)(1), unless the authorization is terminated or revoked.  Performed at [facility], [address] [place], [place] [address]      Radiology Studies: DG Tibia/Fibula Left  Result Date: 04/26/2021 CLINICAL DATA:  Wounds EXAM: LEFT TIBIA AND FIBULA - 2 VIEW COMPARISON:  None. FINDINGS: Diffuse vascular calcifications. No acute bony abnormality. Specifically, no fracture, subluxation, or dislocation. No bone destruction to suggest osteomyelitis. IMPRESSION: No acute bony abnormality. Electronically Signed   By: Rolm Baptise M.D.   On: 04/07/2021 19:54   DG Tibia/Fibula Right  Result Date: 04/22/2021 CLINICAL DATA:  Wounds EXAM: RIGHT TIBIA AND FIBULA - 2 VIEW COMPARISON:  None. FINDINGS: Dilated calcified vascular structure, likely calcified varicose veins. Arterial calcifications. No acute bony abnormality. Specifically, no fracture, subluxation, or dislocation. No bone destruction. IMPRESSION: No acute bony abnormality. Electronically  Signed   By: Rolm Baptise M.D.   On: 04/05/2021 19:57   DG Ankle Complete Left  Result Date: 04/18/2021 CLINICAL DATA:  Wounds EXAM: LEFT ANKLE COMPLETE - 3+ VIEW COMPARISON:  None. FINDINGS: Vascular calcifications. No acute bony abnormality. Specifically, no fracture, subluxation, or dislocation. No bone destruction. Soft tissues are intact. IMPRESSION: No acute bony abnormality. Electronically Signed   By: Rolm Baptise M.D.   On: 04/23/2021 19:55   DG Ankle Complete Right  Result Date: 04/08/2021 CLINICAL DATA:  Wounds EXAM: RIGHT ANKLE - COMPLETE 3+ VIEW COMPARISON:  None. FINDINGS: Diffuse vascular calcifications. Diffuse osteopenia. No acute bony abnormality. Specifically, no fracture, subluxation, or dislocation. No bone destruction. IMPRESSION: No acute bony abnormality. Electronically Signed   By: Rolm Baptise M.D.   On: 04/08/2021 19:59   CT Head Wo Contrast  Result Date: 04/28/2021 CLINICAL DATA:  70 year old female with altered mental status. EXAM: CT HEAD WITHOUT CONTRAST TECHNIQUE: Contiguous axial images were obtained from the base of the skull through the vertex without intravenous contrast. COMPARISON:  Head CT dated 02/05/2021. FINDINGS: Brain:Mild age-related atrophy and moderate chronic microvascular ischemic changes. Postsurgical changes of the right cerebellar lobe with an area of encephalomalacia. There is no acute intracranial hemorrhage. No mass effect or midline shift. No extra-axial fluid collection. Vascular: No hyperdense vessel or unexpected calcification. Skull: No acute calvarial pathology. Prior right occipital craniectomy. Sinuses/Orbits: Mild mucoperiosteal thickening of paranasal sinuses. There is perforation of the nasal septum. No air-fluid level. Left mastoid effusions. The right mastoid air cells are clear. Other: None IMPRESSION: 1.  No acute intracranial pathology. 2. Age-related atrophy and chronic microvascular ischemic changes. 3. Postsurgical changes of right  cerebellar lobe with an area of encephalomalacia. 4. Left mastoid effusions. Electronically Signed   By: Anner Crete M.D.   On: 04/23/2021 16:33   MR FOOT LEFT WO CONTRAST  Result Date: 04/07/2021 CLINICAL DATA:  Foot swelling. History of non-Hodgkin's lymphoma. Peripheral vascular disease. EXAM: MRI OF THE LEFT FOOT WITHOUT CONTRAST TECHNIQUE: Multiplanar, multisequence MR imaging of the left forefoot was performed. No intravenous contrast was administered. COMPARISON:  Radiographs 04/28/2021 FINDINGS: Despite efforts by the technologist and patient, severe motion artifact is present on today's exam and could not be eliminated. This reduces exam sensitivity and specificity. The patient terminated the exam prior to completion due to discomfort, and the sagittal series could not be obtained, and repeat attempts to compensate for the severe motion artifact were also curtailed. Bones/Joint/Cartilage There is abnormal marrow edema distally in the proximal phalanx of the small toe. Appearance concerning for osteomyelitis. On conventional radiography, there appear to be truncation of the fourth digit at the level of the distal metaphysis of the proximal phalanx, with nonvisualization of the middle and distal phalanges. In the absence of a prior amputation this probably reflects bony destruction from osteomyelitis. I do not see obvious edema signal in the residual proximal half of the proximal phalanx, although images must be understood to be severely limited due to the motion artifact. Hallux valgus noted. Ligaments Indeterminate due to motion Muscles and Tendons Low-level edema tracks along the forefoot musculature and is probably neurogenic. Soft tissues Subcutaneous edema dorsally in the forefoot is nonspecific. Cellulitis is not excluded. IMPRESSION: 1. Severe motion artifact limits sensitivity and specificity, and truncated the exam. 2. Abnormal edema distally in the proximal phalanx of the small toe  suspicious for active osteomyelitis. I do not observe definite edema in the remaining portion of the proximal phalanx of the fourth toe, with the understanding that the region is substantially blurred by motion artifact. On conventional radiography there is absence of the middle and distal phalanges of the fourth toe suspicious for bony destruction from osteomyelitis assuming no prior amputation. 3. Low-level edema in the forefoot musculature, probably neurogenic. 4. Dorsal subcutaneous edema in the forefoot could reflect cellulitis. Electronically Signed   By: Van Clines M.D.   On: 04/07/2021 09:57   DG Chest Port 1 View  Result Date: 04/09/2021 CLINICAL DATA:  70 year old female with sepsis. EXAM: PORTABLE CHEST 1 VIEW COMPARISON:  Chest radiograph dated 02/08/2021. FINDINGS: There is diffuse chronic interstitial coarsening and bronchitic changes. No focal consolidation, pleural effusion, or pneumothorax. There is cardiomegaly. Bilateral hilar prominence, likely enlarged pulmonary arteries and suggestive of pulmonary hypertension. Atherosclerotic calcification of the aortic arch. No acute osseous pathology. IMPRESSION: 1. No acute cardiopulmonary process. 2. Cardiomegaly. Electronically Signed   By: Anner Crete M.D.   On: 04/13/2021 16:35   DG Foot Complete Left  Result Date: 04/19/2021 CLINICAL DATA:  Wounds EXAM: LEFT FOOT - COMPLETE 3+ VIEW COMPARISON:  None. FINDINGS: Hallux valgus deformity. Diffuse osteopenia. No acute bony abnormality. Specifically, no fracture, subluxation, or dislocation. There appears to be bone destruction in the distal phalanx of the 4th and possibly 5th toes, age indeterminate. IMPRESSION: Apparent bone destruction in the distal phalanges of the left 4th and 5th toes, question chronicity. Cannot exclude acute or chronic osteomyelitis. Electronically Signed   By: Rolm Baptise M.D.   On: 04/07/2021 19:56   DG Foot Complete Right  Result Date:  04/11/2021 CLINICAL  DATA:  Wounds EXAM: RIGHT FOOT COMPLETE - 3+ VIEW COMPARISON:  None. 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 No bone destruction seen to suggest acute osteomyelitis. Diffuse osteopenia. Hallux valgus deformity. No acute bony abnormality. Specifically, no fracture, subluxation, or dislocation. IMPRESSION: Chronic changes as above. No acute bony abnormality. Electronically Signed   By: Rolm Baptise M.D.   On: 04/02/2021 20:00    Scheduled Meds: . citalopram  20 mg Oral QHS  . clopidogrel  75 mg Oral Daily  . cycloSPORINE  2 drop Both Eyes BID  . folic acid  1 mg Oral Daily  . heparin  5,000 Units Subcutaneous Q8H  . levothyroxine  50 mcg Oral Q0600  . macitentan  10 mg Oral Daily  . magnesium oxide  200 mg Oral BID  . multivitamin with minerals  1 tablet Oral Daily  . nutrition supplement (JUVEN)  1 packet Oral BID WC  . pantoprazole  40 mg Oral BID  . Ensure Max Protein  11 oz Oral Daily  . torsemide  20 mg Oral QHS  . torsemide  40 mg Oral Daily  . vitamin B-12  1,000 mcg Oral Daily   Continuous Infusions: . sodium chloride 50 mL/hr at 04/22/2021 1728  . ceFEPime (MAXIPIME) IV 2 g (04/08/21 0505)     LOS: 2 days   Marylu Lund, MD [facility] Pager On Amion  If 7PM-7AM, please contact night-coverage 04/08/2021, 3:15 PM

## 2021-04-08 NOTE — Progress Notes (Signed)
Subjective: No new complaints   Antibiotics:  Anti-infectives (From admission, onward)   Start     Dose/Rate Route Frequency Ordered Stop   04/08/21 0600  vancomycin (VANCOREADY) IVPB 1000 mg/200 mL  Status:  Discontinued        1,000 mg 200 mL/hr over 60 Minutes Intravenous Every 36 hours 04/07/21 0059 04/08/21 0828   04/07/21 0600  ceFEPIme (MAXIPIME) 2 g in sodium chloride 0.9 % 100 mL IVPB        2 g 200 mL/hr over 30 Minutes Intravenous Every 12 hours 04/07/21 0059     04/18/2021 1715  vancomycin (VANCOCIN) IVPB 1000 mg/200 mL premix        1,000 mg 200 mL/hr over 60 Minutes Intravenous  Once 04/16/2021 1711 04/11/2021 2045   04/26/2021 1715  ceFEPIme (MAXIPIME) 2 g in sodium chloride 0.9 % 100 mL IVPB        2 g 200 mL/hr over 30 Minutes Intravenous  Once 04/08/2021 1711 04/21/2021 1800      Medications: Scheduled Meds: . citalopram  20 mg Oral QHS  . clopidogrel  75 mg Oral Daily  . cycloSPORINE  2 drop Both Eyes BID  . folic acid  1 mg Oral Daily  . heparin  5,000 Units Subcutaneous Q8H  . levothyroxine  50 mcg Oral Q0600  . macitentan  10 mg Oral Daily  . magnesium oxide  200 mg Oral BID  . multivitamin with minerals  1 tablet Oral Daily  . nutrition supplement (JUVEN)  1 packet Oral BID WC  . pantoprazole  40 mg Oral BID  . Ensure Max Protein  11 oz Oral Daily  . torsemide  20 mg Oral QHS  . torsemide  40 mg Oral Daily  . vitamin B-12  1,000 mcg Oral Daily   Continuous Infusions: . sodium chloride 50 mL/hr at 04/11/2021 1728  . ceFEPime (MAXIPIME) IV 2 g (04/08/21 0505)   PRN Meds:.sodium chloride, acetaminophen, HYDROmorphone (DILAUDID) injection, morphine injection, oxyCODONE-acetaminophen    Objective: Weight change:   Intake/Output Summary (Last 24 hours) at 04/08/2021 1650 Last data filed at 04/08/2021 0608 Gross per 24 hour  Intake 387.87 ml  Output 1375 ml  Net -987.13 ml   Blood pressure (!) 145/74, pulse 96, temperature 98.2 F (36.8 C),  temperature source Oral, resp. rate 18, height 5' 7.5" (1.715 m), weight 53.9 kg, SpO2 (!) 88 %. Temp:  [97.6 F (36.4 C)-98.5 F (36.9 C)] 98.2 F (36.8 C) (04/09 1437) Pulse Rate:  [88-96] 96 (04/09 1437) Resp:  [16-22] 18 (04/09 1437) BP: (117-145)/(51-74) 145/74 (04/09 1437) SpO2:  [88 %-92 %] 88 % (04/09 1437)  Physical Exam: Physical Exam Constitutional:      Appearance: She is cachectic. She is ill-appearing.  Cardiovascular:     Rate and Rhythm: Tachycardia present.  Pulmonary:     Effort: No respiratory distress.     Breath sounds: No wheezing.  Abdominal:     General: There is no distension.  Neurological:     General: No focal deficit present.     Mental Status: She is alert.  Psychiatric:        Speech: Speech is delayed.        Behavior: Behavior is cooperative.     Extremities wrapped  CBC:    BMET Recent Labs    04/07/21 0006 04/08/21 0523  NA 130* 134*  K 5.2* 4.3  CL 94* 98  CO2 24 21*  GLUCOSE 90 76  BUN 44* 45*  CREATININE 1.16* 1.24*  CALCIUM 8.4* 8.3*     Liver Panel  Recent Labs    04/05/2021 1539 04/08/21 0523  PROT 6.4* 5.7*  ALBUMIN 2.6* 2.1*  AST 26 92*  ALT 17 52*  ALKPHOS 119 113  BILITOT 0.2* 0.4       Sedimentation Rate No results for input(s): ESRSEDRATE in the last 72 hours. C-Reactive Protein No results for input(s): CRP in the last 72 hours.  Micro Results: Recent Results (from the past 720 hour(s))  Blood Culture (routine x 2)     Status: None (Preliminary result)   Collection Time: 04/29/2021  4:23 PM   Specimen: BLOOD  Result Value Ref Range Status   Specimen Description   Final    BLOOD RIGHT ANTECUBITAL Performed at [facility], [place] [address], [place], [place] [address]    Special Requests   Final    BOTTLES DRAWN AEROBIC AND ANAEROBIC Blood Culture adequate volume Performed at [facility], [address] [place], [place] [address]    Culture  Setup Time   Final    GRAM  NEGATIVE RODS AEROBIC BOTTLE ONLY Organism ID to follow CRITICAL RESULT CALLED TO, READ BACK BY AND VERIFIED WITHSeleta Rhymes [facility] [id number] 04/07/21 A BROWNING Performed at [facility], [place] [address] [place], [place] [address]    Culture GRAM NEGATIVE RODS  Final   Report Status PENDING  Incomplete  Blood Culture (routine x 2)     Status: None (Preliminary result)   Collection Time: 04/10/2021  4:23 PM   Specimen: BLOOD  Result Value Ref Range Status   Specimen Description   Final    BLOOD LEFT ANTECUBITAL Performed at [facility], [place] [address], [place], [place] [address]    Special Requests   Final    BOTTLES DRAWN AEROBIC AND ANAEROBIC Blood Culture adequate volume Performed at [facility], [address] [place], [place] [address]    Culture   Final    NO GROWTH 2 DAYS Performed at [facility], [place] [address] [place],  [address]    Report Status PENDING  Incomplete  Blood Culture ID Panel (Reflexed)     Status: Abnormal   Collection Time: 04/07/2021  4:23 PM  Result Value Ref Range Status   Enterococcus faecalis NOT DETECTED NOT DETECTED Final   Enterococcus Faecium NOT DETECTED NOT DETECTED Final   Listeria monocytogenes NOT DETECTED NOT DETECTED Final   Staphylococcus species NOT DETECTED NOT DETECTED Final   Staphylococcus aureus (BCID) NOT DETECTED NOT DETECTED Final   Staphylococcus epidermidis NOT DETECTED NOT DETECTED Final   Staphylococcus lugdunensis NOT DETECTED NOT DETECTED Final   Streptococcus species NOT DETECTED NOT DETECTED Final   Streptococcus agalactiae NOT DETECTED NOT DETECTED Final   Streptococcus pneumoniae NOT DETECTED NOT DETECTED Final   Streptococcus pyogenes NOT DETECTED NOT DETECTED Final   A.calcoaceticus-baumannii NOT DETECTED NOT DETECTED Final   Bacteroides fragilis NOT DETECTED NOT DETECTED Final   Enterobacterales NOT DETECTED NOT DETECTED Final   Enterobacter cloacae complex NOT DETECTED NOT DETECTED  Final   Escherichia coli NOT DETECTED NOT DETECTED Final   Klebsiella aerogenes NOT DETECTED NOT DETECTED Final   Klebsiella oxytoca NOT DETECTED NOT DETECTED Final   Klebsiella pneumoniae NOT DETECTED NOT DETECTED Final   Proteus species NOT DETECTED NOT DETECTED Final   Salmonella species NOT DETECTED NOT DETECTED Final  Serratia marcescens NOT DETECTED NOT DETECTED Final   Haemophilus influenzae NOT DETECTED NOT DETECTED Final   Neisseria meningitidis NOT DETECTED NOT DETECTED Final   Pseudomonas aeruginosa DETECTED (A) NOT DETECTED Final    Comment: CRITICAL RESULT CALLED TO, READ BACK BY AND VERIFIED WITH: Seleta Rhymes PHARMD [id number] 04/07/21 A BROWNING    Stenotrophomonas maltophilia NOT DETECTED NOT DETECTED Final   Candida albicans NOT DETECTED NOT DETECTED Final   Candida auris NOT DETECTED NOT DETECTED Final   Candida glabrata NOT DETECTED NOT DETECTED Final   Candida krusei NOT DETECTED NOT DETECTED Final   Candida parapsilosis NOT DETECTED NOT DETECTED Final   Candida tropicalis NOT DETECTED NOT DETECTED Final   Cryptococcus neoformans/gattii NOT DETECTED NOT DETECTED Final   CTX-M ESBL NOT DETECTED NOT DETECTED Final   Carbapenem resistance IMP NOT DETECTED NOT DETECTED Final   Carbapenem resistance KPC NOT DETECTED NOT DETECTED Final   Carbapenem resistance NDM NOT DETECTED NOT DETECTED Final   Carbapenem resistance VIM NOT DETECTED NOT DETECTED Final    Comment: Performed at [facility], [address], [place], [place] [address]  Urine culture     Status: None   Collection Time: 04/04/2021  4:26 PM   Specimen: Urine, Random  Result Value Ref Range Status   Specimen Description   Final    URINE, RANDOM Performed at [facility], [address] [place], [place] [address]    Special Requests   Final    NONE Performed at [facility], [address] [place], [place] [address]    Culture   Final    NO GROWTH Performed at [place], [place] [address] [place], [place] [address]    Report Status 04/08/2021 FINAL  Final  Resp Panel by RT-PCR (Flu A&B, Covid) Nasopharyngeal Swab     Status: None   Collection Time: 04/14/2021  8:00 PM   Specimen: Nasopharyngeal Swab; Nasopharyngeal(NP) swabs in vial transport medium  Result Value Ref Range Status   SARS Coronavirus 2 by RT PCR NEGATIVE NEGATIVE Final    Comment: (NOTE) SARS-CoV-2 target nucleic acids are NOT DETECTED.  The SARS-CoV-2 RNA is generally detectable in upper respiratory specimens during the acute phase of infection. The lowest concentration of SARS-CoV-2 viral copies this assay can detect is 138 copies/mL. A negative result does not preclude SARS-Cov-2 infection and should not be used as the sole basis for treatment or other patient management decisions. A negative result may occur with  improper specimen collection/handling, submission of specimen other than nasopharyngeal swab, presence of viral mutation(s) within the areas targeted by this assay, and inadequate number of viral copies(<138 copies/mL). A negative result must be combined with clinical observations, patient history, and epidemiological information. The expected result is Negative.  Fact Sheet for Patients:  EntrepreneurPulse.com.au  Fact Sheet for Healthcare Providers:  IncredibleEmployment.be  This test is no t yet approved or cleared by the [place] FDA and  has been authorized for detection and/or diagnosis of SARS-CoV-2 by FDA under an Emergency Use Authorization (EUA). This EUA will remain  in effect (meaning this test can be used) for the duration of the COVID-19 declaration under Section 564(b)(1) of the Act, 21 U.S.C.section 360bbb-3(b)(1), unless the authorization is terminated  or revoked sooner.       Influenza A by PCR NEGATIVE NEGATIVE Final   Influenza B by PCR NEGATIVE NEGATIVE Final    Comment: (NOTE) The Xpert Xpress  SARS-CoV-2/FLU/RSV plus assay is intended as an aid in the diagnosis of influenza from Nasopharyngeal swab specimens and should not be used as a sole basis for treatment. Nasal washings and aspirates are unacceptable for Xpert Xpress SARS-CoV-2/FLU/RSV testing.  Fact Sheet for Patients: EntrepreneurPulse.com.au  Fact Sheet for Healthcare Providers: IncredibleEmployment.be  This test is not yet approved or cleared by the [place] FDA and has been authorized for detection and/or diagnosis of SARS-CoV-2 by FDA under an Emergency Use Authorization (EUA). This EUA will remain in effect (meaning this test can be used) for the duration of the COVID-19 declaration under Section 564(b)(1) of the Act, 21 U.S.C. section 360bbb-3(b)(1), unless the authorization is terminated or revoked.  Performed at [facility], [address] [place], [place] [address]     Studies/Results: [place] Tibia/Fibula Left  Result Date: 04/22/2021 CLINICAL DATA:  Wounds EXAM: LEFT TIBIA AND FIBULA - 2 VIEW COMPARISON:  None. FINDINGS: Diffuse vascular calcifications. No acute bony abnormality. Specifically, no fracture, subluxation, or dislocation. No bone destruction to suggest osteomyelitis. IMPRESSION: No acute bony abnormality. Electronically Signed   By: Rolm Baptise M.D.   On: 04/15/2021 19:54   DG Tibia/Fibula Right  Result Date: 04/09/2021 CLINICAL DATA:  Wounds EXAM: RIGHT TIBIA AND FIBULA - 2 VIEW COMPARISON:  None. FINDINGS: Dilated calcified vascular structure, likely calcified varicose veins. Arterial calcifications. No acute bony abnormality. Specifically, no fracture, subluxation, or dislocation. No bone destruction. IMPRESSION: No acute bony abnormality. Electronically Signed   By: Rolm Baptise M.D.   On: 04/07/2021 19:57   DG Ankle Complete Left  Result Date: 04/13/2021 CLINICAL DATA:  Wounds EXAM: LEFT ANKLE COMPLETE - 3+ VIEW COMPARISON:  None.  FINDINGS: Vascular calcifications. No acute bony abnormality. Specifically, no fracture, subluxation, or dislocation. No bone destruction. Soft tissues are intact. IMPRESSION: No acute bony abnormality. Electronically Signed   By: Rolm Baptise M.D.   On: 04/20/2021 19:55   DG Ankle Complete Right  Result Date: 04/11/2021 CLINICAL DATA:  Wounds EXAM: RIGHT ANKLE - COMPLETE 3+ VIEW COMPARISON:  None. FINDINGS: Diffuse vascular calcifications. Diffuse osteopenia. No acute bony abnormality. Specifically, no fracture, subluxation, or dislocation. No bone destruction. IMPRESSION: No acute bony abnormality. Electronically Signed   By: Rolm Baptise M.D.   On: 04/07/2021 19:59   MR FOOT LEFT WO CONTRAST  Result Date: 04/07/2021 CLINICAL DATA:  Foot swelling. History of non-Hodgkin's lymphoma. Peripheral vascular disease. EXAM: MRI OF THE LEFT FOOT WITHOUT CONTRAST TECHNIQUE: Multiplanar, multisequence MR imaging of the left forefoot was performed. No intravenous contrast was administered. COMPARISON:  Radiographs 04/07/2021 FINDINGS: Despite efforts by the technologist and patient, severe motion artifact is present on today's exam and could not be eliminated. This reduces exam sensitivity and specificity. The patient terminated the exam prior to completion due to discomfort, and the sagittal series could not be obtained, and repeat attempts to compensate for the severe motion artifact were also curtailed. Bones/Joint/Cartilage There is abnormal marrow edema distally in the proximal phalanx of the small toe. Appearance concerning for osteomyelitis. On conventional radiography, there appear to be truncation of the fourth digit at the level of the distal metaphysis of the proximal phalanx, with nonvisualization of the middle and distal phalanges. In the absence of a prior amputation this probably reflects bony destruction from osteomyelitis. I do not see obvious edema signal in the residual proximal half of the proximal  phalanx, although images must be understood to be severely limited due to the motion artifact. Hallux  valgus noted. Ligaments Indeterminate due to motion Muscles and Tendons Low-level edema tracks along the forefoot musculature and is probably neurogenic. Soft tissues Subcutaneous edema dorsally in the forefoot is nonspecific. Cellulitis is not excluded. IMPRESSION: 1. Severe motion artifact limits sensitivity and specificity, and truncated the exam. 2. Abnormal edema distally in the proximal phalanx of the small toe suspicious for active osteomyelitis. I do not observe definite edema in the remaining portion of the proximal phalanx of the fourth toe, with the understanding that the region is substantially blurred by motion artifact. On conventional radiography there is absence of the middle and distal phalanges of the fourth toe suspicious for bony destruction from osteomyelitis assuming no prior amputation. 3. Low-level edema in the forefoot musculature, probably neurogenic. 4. Dorsal subcutaneous edema in the forefoot could reflect cellulitis. Electronically Signed   By: Van Clines M.D.   On: 04/07/2021 09:57   DG Foot Complete Left  Result Date: 04/02/2021 CLINICAL DATA:  Wounds EXAM: LEFT FOOT - COMPLETE 3+ VIEW COMPARISON:  None. FINDINGS: Hallux valgus deformity. Diffuse osteopenia. No acute bony abnormality. Specifically, no fracture, subluxation, or dislocation. There appears to be bone destruction in the distal phalanx of the 4th and possibly 5th toes, age indeterminate. IMPRESSION: Apparent bone destruction in the distal phalanges of the left 4th and 5th toes, question chronicity. Cannot exclude acute or chronic osteomyelitis. Electronically Signed   By: Rolm Baptise M.D.   On: 04/02/2021 19:56   DG Foot Complete Right  Result Date: 04/05/2021 CLINICAL DATA:  Wounds EXAM: RIGHT FOOT COMPLETE - 3+ VIEW COMPARISON:  None. 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 No bone destruction seen to suggest acute osteomyelitis. Diffuse osteopenia. Hallux valgus deformity. No acute bony abnormality. Specifically, no fracture, subluxation, or dislocation. IMPRESSION: Chronic changes as above. No acute bony abnormality. Electronically Signed   By: Rolm Baptise M.D.   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  Blood cultures were taken and she was placed on vancomycin and cefepime in the interim 1 of 2 blood cultures is positive for Pseudomonas.  DC vancomycin.  Continue cefepime for now.  Husband is very anxious about overusing antibiotics and I assured him would be judicious with antibiotics with his wife.     LOS: 2 days   Alcide Evener 04/08/2021, 4:50 PM

## 2021-04-08 NOTE — Progress Notes (Signed)
Patient is very confused tonight. She is unable to take her oral medications.NP on call notified.  She was also given a list of all medications that patient was unable to take. I attempted crushing, putting in applesauce, etc. Patient would hold in mouth and spit out. Vitals stable at this time.  Roderick Pee

## 2021-04-08 NOTE — Plan of Care (Signed)
  Problem: Education: Goal: Knowledge of General Education information will improve Description: Including pain rating scale, medication(s)/side effects and non-pharmacologic comfort measures Outcome: Progressing

## 2021-04-09 DIAGNOSIS — N179 Acute kidney failure, unspecified: Secondary | ICD-10-CM | POA: Diagnosis not present

## 2021-04-09 DIAGNOSIS — L03115 Cellulitis of right lower limb: Secondary | ICD-10-CM | POA: Diagnosis not present

## 2021-04-09 DIAGNOSIS — J9611 Chronic respiratory failure with hypoxia: Secondary | ICD-10-CM | POA: Diagnosis not present

## 2021-04-09 DIAGNOSIS — J849 Interstitial pulmonary disease, unspecified: Secondary | ICD-10-CM | POA: Diagnosis not present

## 2021-04-09 DIAGNOSIS — I480 Paroxysmal atrial fibrillation: Secondary | ICD-10-CM

## 2021-04-09 LAB — CBC
HCT: 26.3 % — ABNORMAL LOW (ref 36.0–46.0)
Hemoglobin: 8.2 g/dL — ABNORMAL LOW (ref 12.0–15.0)
MCH: 30.1 pg (ref 26.0–34.0)
MCHC: 31.2 g/dL (ref 30.0–36.0)
MCV: 96.7 fL (ref 80.0–100.0)
Platelets: 408 10*3/uL — ABNORMAL HIGH (ref 150–400)
RBC: 2.72 MIL/uL — ABNORMAL LOW (ref 3.87–5.11)
RDW: 19.3 % — ABNORMAL HIGH (ref 11.5–15.5)
WBC: 17.7 10*3/uL — ABNORMAL HIGH (ref 4.0–10.5)
nRBC: 0.1 % (ref 0.0–0.2)

## 2021-04-09 LAB — COMPREHENSIVE METABOLIC PANEL
ALT: 60 U/L — ABNORMAL HIGH (ref 0–44)
ALT: 55 U/L — ABNORMAL HIGH (ref 0–44)
AST: 97 U/L — ABNORMAL HIGH (ref 15–41)
AST: 85 U/L — ABNORMAL HIGH (ref 15–41)
Albumin: 2.2 g/dL — ABNORMAL LOW (ref 3.5–5.0)
Albumin: 2.3 g/dL — ABNORMAL LOW (ref 3.5–5.0)
Alkaline Phosphatase: 153 U/L — ABNORMAL HIGH (ref 38–126)
Alkaline Phosphatase: 147 U/L — ABNORMAL HIGH (ref 38–126)
Anion gap: 19 — ABNORMAL HIGH (ref 5–15)
Anion gap: 25 — ABNORMAL HIGH (ref 5–15)
BUN: 46 mg/dL — ABNORMAL HIGH (ref 8–23)
BUN: 44 mg/dL — ABNORMAL HIGH (ref 8–23)
CO2: 17 mmol/L — ABNORMAL LOW (ref 22–32)
CO2: 16 mmol/L — ABNORMAL LOW (ref 22–32)
Calcium: 8.4 mg/dL — ABNORMAL LOW (ref 8.9–10.3)
Calcium: 8.8 mg/dL — ABNORMAL LOW (ref 8.9–10.3)
Chloride: 99 mmol/L (ref 98–111)
Chloride: 101 mmol/L (ref 98–111)
Creatinine, Ser: 1.14 mg/dL — ABNORMAL HIGH (ref 0.44–1.00)
Creatinine, Ser: 1.44 mg/dL — ABNORMAL HIGH (ref 0.44–1.00)
GFR, Estimated: 51 mL/min — ABNORMAL LOW (ref >60)
GFR, Estimated: 39 mL/min — ABNORMAL LOW (ref >60)
Glucose, Bld: 72 mg/dL (ref 70–99)
Glucose, Bld: 75 mg/dL (ref 70–99)
Potassium: 3.9 mmol/L (ref 3.5–5.1)
Potassium: 4.1 mmol/L (ref 3.5–5.1)
Sodium: 135 mmol/L (ref 135–145)
Sodium: 142 mmol/L (ref 135–145)
Total Bilirubin: 0.8 mg/dL (ref 0.3–1.2)
Total Bilirubin: 1.4 mg/dL — ABNORMAL HIGH (ref 0.3–1.2)
Total Protein: 5.8 g/dL — ABNORMAL LOW (ref 6.5–8.1)
Total Protein: 6.3 g/dL — ABNORMAL LOW (ref 6.5–8.1)

## 2021-04-09 LAB — AMMONIA: Ammonia: 24 umol/L (ref 9–35)

## 2021-04-09 NOTE — Progress Notes (Signed)
Patient has been very confused over the past 2 nights. Disoriented x 4, not eating or drinking anything. Husband concerned regarding nutritional status. NP on call notified.  Attempt at POs tonight was unsuccessful. Roderick Pee

## 2021-04-09 NOTE — Progress Notes (Signed)
Subjective:  Patient is more confused today after having had Dilaudid last night   Antibiotics:  Anti-infectives (From admission, onward)   Start     Dose/Rate Route Frequency Ordered Stop   04/08/21 0600  vancomycin (VANCOREADY) IVPB 1000 mg/200 mL  Status:  Discontinued        1,000 mg 200 mL/hr over 60 Minutes Intravenous Every 36 hours 04/07/21 0059 04/08/21 0828   04/07/21 0600  ceFEPIme (MAXIPIME) 2 g in sodium chloride 0.9 % 100 mL IVPB        2 g 200 mL/hr over 30 Minutes Intravenous Every 12 hours 04/07/21 0059     04/12/2021 1715  vancomycin (VANCOCIN) IVPB 1000 mg/200 mL premix        1,000 mg 200 mL/hr over 60 Minutes Intravenous  Once 04/10/2021 1711 04/10/2021 2045   04/10/2021 1715  ceFEPIme (MAXIPIME) 2 g in sodium chloride 0.9 % 100 mL IVPB        2 g 200 mL/hr over 30 Minutes Intravenous  Once 04/29/2021 1711 04/08/2021 1800      Medications: Scheduled Meds: . citalopram  20 mg Oral QHS  . clopidogrel  75 mg Oral Daily  . cycloSPORINE  2 drop Both Eyes BID  . folic acid  1 mg Oral Daily  . heparin  5,000 Units Subcutaneous Q8H  . levothyroxine  50 mcg Oral Q0600  . macitentan  10 mg Oral Daily  . magnesium oxide  200 mg Oral BID  . multivitamin with minerals  1 tablet Oral Daily  . nutrition supplement (JUVEN)  1 packet Oral BID WC  . pantoprazole  40 mg Oral BID  . Ensure Max Protein  11 oz Oral Daily  . torsemide  20 mg Oral QHS  . torsemide  40 mg Oral Daily  . vitamin B-12  1,000 mcg Oral Daily   Continuous Infusions: . sodium chloride 50 mL/hr at 04/09/21 0510  . ceFEPime (MAXIPIME) IV 2 g (04/09/21 0647)   PRN Meds:.sodium chloride, acetaminophen, HYDROmorphone (DILAUDID) injection, morphine injection, oxyCODONE-acetaminophen    Objective: Weight change:   Intake/Output Summary (Last 24 hours) at 04/09/2021 1545 Last data filed at 04/09/2021 0211 Gross per 24 hour  Intake 350 ml  Output 950 ml  Net -600 ml   Blood pressure (!) 148/66,  pulse 90, temperature (!) 97.4 F (36.3 C), resp. rate 18, height 5' 7.5" (1.715 m), weight 53.9 kg, SpO2 (!) 76 %. Temp:  [97.3 F (36.3 C)-97.5 F (36.4 C)] 97.4 F (36.3 C) (04/10 0628) Pulse Rate:  [87-90] 90 (04/10 0628) Resp:  [18] 18 (04/10 0628) BP: (143-149)/(63-66) 148/66 (04/10 0628) SpO2:  [76 %-94 %] 76 % (04/10 7654)  Physical Exam: Physical Exam Constitutional:      Appearance: She is cachectic. She is ill-appearing.  Cardiovascular:     Rate and Rhythm: Tachycardia present.  Pulmonary:     Effort: No respiratory distress.     Breath sounds: No wheezing.  Abdominal:     General: There is no distension.  Neurological:     General: No focal deficit present.     Mental Status: She is alert. She is disoriented.  Psychiatric:        Speech: She is noncommunicative.        Behavior: Behavior is cooperative.     Extremities wrapped  CBC:    BMET Recent Labs    04/08/21 0523 04/09/21 0615  NA 134* 135  K 4.3  3.9  CL 98 99  CO2 21* 17*  GLUCOSE 76 72  BUN 45* 46*  CREATININE 1.24* 1.14*  CALCIUM 8.3* 8.4*     Liver Panel  Recent Labs    04/08/21 0523 04/09/21 0615  PROT 5.7* 5.8*  ALBUMIN 2.1* 2.2*  AST 92* 97*  ALT 52* 60*  ALKPHOS 113 153*  BILITOT 0.4 0.8       Sedimentation Rate No results for input(s): ESRSEDRATE in the last 72 hours. C-Reactive Protein No results for input(s): CRP in the last 72 hours.  Micro Results: Recent Results (from the past 720 hour(s))  Blood Culture (routine x 2)     Status: Abnormal (Preliminary result)   Collection Time: 04/14/2021  4:23 PM   Specimen: BLOOD  Result Value Ref Range Status   Specimen Description   Final    BLOOD RIGHT ANTECUBITAL Performed at [facility], [place] [address] [place], [place] [address]    Special Requests   Final    BOTTLES DRAWN AEROBIC AND ANAEROBIC Blood Culture adequate volume Performed at [facility], [address] [place], [place]  [address]    Culture  Setup Time   Final    GRAM NEGATIVE RODS AEROBIC BOTTLE ONLY Organism ID to follow CRITICAL RESULT CALLED TO, READ BACK BY AND VERIFIED WITHSeleta Rhymes [facility] 5732 04/07/21 A BROWNING Performed at [facility], [place] [address], [place], [place] [address]    Culture PSEUDOMONAS AERUGINOSA (A)  Final   Report Status PENDING  Incomplete  Blood Culture (routine x 2)     Status: None (Preliminary result)   Collection Time: 04/08/2021  4:23 PM   Specimen: BLOOD  Result Value Ref Range Status   Specimen Description   Final    BLOOD LEFT ANTECUBITAL Performed at [facility], [place] [address], [place], [place] [address]    Special Requests   Final    BOTTLES DRAWN AEROBIC AND ANAEROBIC Blood Culture adequate volume Performed at [facility], [place] [place], [place] [address]    Culture   Final    NO GROWTH 3 DAYS Performed at [facility], [place] [address], [place], [place] [address]    Report Status PENDING  Incomplete  Blood Culture ID Panel (Reflexed)     Status: Abnormal   Collection Time: 04/09/2021  4:23 PM  Result Value Ref Range Status   Enterococcus faecalis NOT DETECTED NOT DETECTED Final   Enterococcus Faecium NOT DETECTED NOT DETECTED Final   Listeria monocytogenes NOT DETECTED NOT DETECTED Final   Staphylococcus species NOT DETECTED NOT DETECTED Final   Staphylococcus aureus (BCID) NOT DETECTED NOT DETECTED Final   Staphylococcus epidermidis NOT DETECTED NOT DETECTED Final   Staphylococcus lugdunensis NOT DETECTED NOT DETECTED Final   Streptococcus species NOT DETECTED NOT DETECTED Final   Streptococcus agalactiae NOT DETECTED NOT DETECTED Final   Streptococcus pneumoniae NOT DETECTED NOT DETECTED Final   Streptococcus pyogenes NOT DETECTED NOT DETECTED Final   A.calcoaceticus-baumannii NOT DETECTED NOT DETECTED Final   Bacteroides fragilis NOT DETECTED NOT DETECTED Final   Enterobacterales NOT DETECTED NOT DETECTED Final    Enterobacter cloacae complex NOT DETECTED NOT DETECTED Final   Escherichia coli NOT DETECTED NOT DETECTED Final   Klebsiella aerogenes NOT DETECTED NOT DETECTED Final   Klebsiella oxytoca NOT DETECTED NOT DETECTED Final   Klebsiella pneumoniae NOT DETECTED NOT DETECTED Final   Proteus species NOT DETECTED NOT DETECTED  Final   Salmonella species NOT DETECTED NOT DETECTED Final   Serratia marcescens NOT DETECTED NOT DETECTED Final   Haemophilus influenzae NOT DETECTED NOT DETECTED Final   Neisseria meningitidis NOT DETECTED NOT DETECTED Final   Pseudomonas aeruginosa DETECTED (A) NOT DETECTED Final    Comment: CRITICAL RESULT CALLED TO, READ BACK BY AND VERIFIED WITH: Seleta Rhymes PHARMD [id number] 04/07/21 A BROWNING    Stenotrophomonas maltophilia NOT DETECTED NOT DETECTED Final   Candida albicans NOT DETECTED NOT DETECTED Final   Candida auris NOT DETECTED NOT DETECTED Final   Candida glabrata NOT DETECTED NOT DETECTED Final   Candida krusei NOT DETECTED NOT DETECTED Final   Candida parapsilosis NOT DETECTED NOT DETECTED Final   Candida tropicalis NOT DETECTED NOT DETECTED Final   Cryptococcus neoformans/gattii NOT DETECTED NOT DETECTED Final   CTX-M ESBL NOT DETECTED NOT DETECTED Final   Carbapenem resistance IMP NOT DETECTED NOT DETECTED Final   Carbapenem resistance KPC NOT DETECTED NOT DETECTED Final   Carbapenem resistance NDM NOT DETECTED NOT DETECTED Final   Carbapenem resistance VIM NOT DETECTED NOT DETECTED Final    Comment: Performed at [facility], [address], [place], [place] [address]  Urine culture     Status: None   Collection Time: 04/02/2021  4:26 PM   Specimen: Urine, Random  Result Value Ref Range Status   Specimen Description   Final    URINE, RANDOM Performed at [facility], [address] [place], [place] [address]    Special Requests   Final    NONE Performed at [facility], [place] [place], [place] [address]    Culture    Final    NO GROWTH Performed at [facility], [place] [address] [place], [place] [address]    Report Status 04/08/2021 FINAL  Final  Resp Panel by RT-PCR (Flu A&B, Covid) Nasopharyngeal Swab     Status: None   Collection Time: 04/05/2021  8:00 PM   Specimen: Nasopharyngeal Swab; Nasopharyngeal(NP) swabs in vial transport medium  Result Value Ref Range Status   SARS Coronavirus 2 by RT PCR NEGATIVE NEGATIVE Final    Comment: (NOTE) SARS-CoV-2 target nucleic acids are NOT DETECTED.  The SARS-CoV-2 RNA is generally detectable in upper respiratory specimens during the acute phase of infection. The lowest concentration of SARS-CoV-2 viral copies this assay can detect is 138 copies/mL. A negative result does not preclude SARS-Cov-2 infection and should not be used as the sole basis for treatment or other patient management decisions. A negative result may occur with  improper specimen collection/handling, submission of specimen other than nasopharyngeal swab, presence of viral mutation(s) within the areas targeted by this assay, and inadequate number of viral copies(<138 copies/mL). A negative result must be combined with clinical observations, patient history, and epidemiological information. The expected result is Negative.  Fact Sheet for Patients:  EntrepreneurPulse.com.au  Fact Sheet for Healthcare Providers:  IncredibleEmployment.be  This test is no t yet approved or cleared by the [place] FDA and  has been authorized for detection and/or diagnosis of SARS-CoV-2 by FDA under an Emergency Use Authorization (EUA). This EUA will remain  in effect (meaning this test can be used) for the duration of the COVID-19 declaration under Section 564(b)(1) of the Act, 21 U.S.C.section 360bbb-3(b)(1), unless the authorization is terminated  or revoked sooner.       Influenza A by PCR NEGATIVE NEGATIVE Final   Influenza B by PCR  NEGATIVE  NEGATIVE Final    Comment: (NOTE) The Xpert Xpress SARS-CoV-2/FLU/RSV plus assay is intended as an aid in the diagnosis of influenza from Nasopharyngeal swab specimens and should not be used as a sole basis for treatment. Nasal washings and aspirates are unacceptable for Xpert Xpress SARS-CoV-2/FLU/RSV testing.  Fact Sheet for Patients: EntrepreneurPulse.com.au  Fact Sheet for Healthcare Providers: IncredibleEmployment.be  This test is not yet approved or cleared by the [place] FDA and has been authorized for detection and/or diagnosis of SARS-CoV-2 by FDA under an Emergency Use Authorization (EUA). This EUA will remain in effect (meaning this test can be used) for the duration of the COVID-19 declaration under Section 564(b)(1) of the Act, 21 U.S.C. section 360bbb-3(b)(1), unless the authorization is terminated or revoked.  Performed at [facility] [place], [address] [place], [place] [address]   Culture, blood (routine x 2)     Status: None (Preliminary result)   Collection Time: 04/08/21  8:49 AM   Specimen: BLOOD  Result Value Ref Range Status   Specimen Description   Final    BLOOD RIGHT ANTECUBITAL Performed at [facility] [address] [place], [place] [address]    Special Requests   Final    BOTTLES DRAWN AEROBIC AND ANAEROBIC Blood Culture results may not be optimal due to an inadequate volume of blood received in culture bottles Performed at [place] [address], [place], [place] [address]    Culture   Final    NO GROWTH < 24 HOURS Performed at [facility] [address], [place], [place] [address]    Report Status PENDING  Incomplete  Culture, blood (routine x 2)     Status: None (Preliminary result)   Collection Time: 04/08/21  8:49 AM   Specimen: BLOOD RIGHT HAND  Result Value Ref Range Status   Specimen Description   Final    BLOOD RIGHT  HAND Performed at [facility] [address], [place], [place] [address]    Special Requests   Final    BOTTLES DRAWN AEROBIC AND ANAEROBIC Blood Culture adequate volume Performed at [place] [address] [place], [place] [address]    Culture   Final    NO GROWTH < 24 HOURS Performed at [facility] [address], [place], [place] [address]    Report Status PENDING  Incomplete    Studies/Results: No results found.    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  Blood cultures were taken and she was placed on vancomycin and cefepime in the interim 1 of 2 blood cultures is positive for Pseudomonas.  Plain films and MRI of the left foot had raised question of osteomyelitis involving the fifth toe.  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  Dr. Juleen China will take over the service tomorrow.   LOS: 3 days   Alcide Evener 04/09/2021, 3:45 PM

## 2021-04-09 NOTE — Progress Notes (Signed)
PROGRESS NOTE    [name] [name]  GUY:[id number] DOB: Jun 01, 1951 DOA: 04/01/2021 PCP: [name], MD    Brief Narrative:  70 y.o. 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   Assessment & Plan:   Principal Problem:   Cellulitis, leg Active Problems:   Peripheral arterial occlusive disease (HCC)   Pulmonary hypertension associated with systemic disorder (HCC)   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HCC)   Scleroderma (HCC)   Chronic respiratory failure with hypoxia (HCC)   ILD (interstitial lung disease) (HCC)   AKI (acute kidney injury) ([place])   PAF (paroxysmal atrial fibrillation) (HCC)   Wound cellulitis   SIRS (systemic inflammatory response syndrome) (HCC)   Pseudomonas aeruginosa infection  Chronic wounds of bilateral lower extremity with pseudomonas bacteremia pt presented with WBC of 22.8, lactate of 2.4 Previously had Enterococcus faecalis and Pseudomonas on wound culture Pt was initially started on IV vancomycin and cefepime Patient has previously been seen by ID and was on a short course of IV cefepime but discontinued due to lack of response and that she is a chronic Pseudomonas colonizer.  Patient has failed treatment with wound care, plastic surgery and has already been optimized for her PAD.   Chart reviewed. Numerous specialists have recommended orthopedic referral for amputation, however patient had previously expressed wish against amputations Blood cultures are pos for Pseudomonas, had consulted ID for recs,  appreciate recs. 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 Findings notable for abnormal edema distally in the prox phalanx of the small toe suspicious for active osteomyelitis.  -Had earlier discussed with ID who is familiar with pt. Rec to hold off on surgical consult and to continue with wound care as per [facility] specialists  Lactic acidosis Noted to have trended down with IVF  AKI on CKD stage 3 improving with fluids Recheck cmp in AM  Anemia of chronic disease/iron deficiency has hx of GI bleed nursing noted dark stool although pt also on iron supplementation Hgb is trending up. Family and staff reports no evidence of acute blood loss currently  Hyponatremia Improving with fluids. -Repeat bmet in AM  Interstitial lung disease with chronic hypoxemia Continue baseline O2 of 2 to 3 L Follows with outpatient pulmonology  Pulmonary hypertension continue on Revatio and macitentan Had been continued on torsemide prior to admit  Scleroderma Continue chronic prednisone  PAD Continued with Plavix as tolerated  Paroxysmal atrial fibrillation Not on anticoagulation due to previous GI bleed  History of DVT Has IVC filter in place Seems to be stable at this time  Hyperlipidemia Off statin due to myalgia  DVT prophylaxis: Heparin subq Code Status: Full Family Communication: Pt in room, family at bedside  Status is: Inpatient  Remains inpatient appropriate because:IV treatments appropriate due to intensity of illness or inability to take PO and Inpatient level of care appropriate due to severity of illness   Dispo: The patient is from: Home              Anticipated d/c is to: Home  Patient currently is not medically stable to d/c.   Difficult to place patient No   Consultants:   ID  Procedures:     Antimicrobials: Anti-infectives (From admission, onward)   Start     Dose/Rate Route Frequency  Ordered Stop   04/08/21 0600  vancomycin (VANCOREADY) IVPB 1000 mg/200 mL  Status:  Discontinued        1,000 mg 200 mL/hr over 60 Minutes Intravenous Every 36 hours 04/07/21 0059 04/08/21 0828   04/07/21 0600  ceFEPIme (MAXIPIME) 2 g in sodium chloride 0.9 % 100 mL IVPB        2 g 200 mL/hr over 30 Minutes Intravenous Every 12 hours 04/07/21 0059     04/20/2021 1715  vancomycin (VANCOCIN) IVPB 1000 mg/200 mL premix        1,000 mg 200 mL/hr over 60 Minutes Intravenous  Once 04/20/2021 1711 04/24/2021 2045   04/07/2021 1715  ceFEPIme (MAXIPIME) 2 g in sodium chloride 0.9 % 100 mL IVPB        2 g 200 mL/hr over 30 Minutes Intravenous  Once 04/14/2021 1711 04/11/2021 1800      Subjective: Without issues  Objective: Vitals:   04/08/21 1437 04/08/21 2021 04/08/21 2325 04/09/21 0628  BP: (!) 145/74 (!) 143/65 (!) 149/63 (!) 148/66  Pulse: 96 87 87 90  Resp: _0 Temp: 98.2 F (36.8 C) (!) 97.5 F (36.4 C) (!) 97.3 F (36.3 C) (!) 97.4 F (36.3 C)  TempSrc: Oral Oral Oral   SpO2: (!) 88% 91% 94% (!) 76%  Weight:      Height:        Intake/Output Summary (Last 24 hours) at 04/09/2021 1524 Last data filed at 04/09/2021 0211 Gross per 24 hour  Intake 350 ml  Output 950 ml  Net -600 ml   Filed Weights   04/14/2021 1502 03/31/2021 2300  Weight: 54 kg 53.9 kg    Examination: General exam: Laying in bed, in nad Respiratory system: Normal respiratory effort, no wheezing Cardiovascular system: regular rate, s1, s2 Gastrointestinal system: Soft, nondistended, positive BS Central nervous system: CN2-12 grossly intact, strength intact Extremities: Perfused, no clubbing Skin: Normal skin turgor, no notable skin lesions seen Psychiatry: difficult to assess given mentation    Data Reviewed: I have personally reviewed following labs and imaging studies  CBC: Recent Labs  Lab 04/19/2021 1539 04/07/21 0006 04/08/21 0523 04/08/21 1234 04/09/21 0615  WBC 22.8* 18.3* 15.8* 16.7* 17.7*   NEUTROABS 20.8*  --   --   --   --   HGB 8.0* 9.1* 7.0* 7.2* 8.2*  HCT 25.3* 29.2* 22.6* 23.3* 26.3*  MCV 96.9 99.7 97.8 98.3 96.7  PLT 364 365 408* 341 485*   Basic Metabolic Panel: Recent Labs  Lab 04/13/2021 1539 04/23/2021 1623 04/07/21 0006 04/08/21 0523 04/09/21 0615  NA 128*  --  130* 134* 135  K 4.6  --  5.2* 4.3 3.9  CL 92*  --  94* 98 99  CO2 25  --  24 21* 17*  GLUCOSE 127*  --  90 76 72  BUN 49*  --  44* 45* 46*  CREATININE 1.27*  --  1.16* 1.24* 1.14*  CALCIUM 8.5*  --  8.4* 8.3* 8.4*  MG  --  2.0  --   --   --    GFR: Estimated Creatinine Clearance: 38.5 mL/min (A) (by C-G formula based on SCr of 1.14 mg/dL (H)). Liver Function Tests: Recent Labs  Lab 04/19/2021 1539 04/08/21 0523 04/09/21 0615  AST 26 92* 97*  ALT 17 52* 60*  ALKPHOS 119 113 153*  BILITOT 0.2* 0.4 0.8  PROT 6.4* 5.7* 5.8*  ALBUMIN 2.6* 2.1* 2.2*   No results for input(s): LIPASE, AMYLASE in the last 168 hours. No results for input(s): AMMONIA in the last 168 hours. Coagulation Profile: Recent Labs  Lab 04/29/2021 1539  INR 0.9   Cardiac Enzymes: No results for input(s): CKTOTAL, CKMB, CKMBINDEX, TROPONINI in the last 168 hours. BNP (last 3 results) Recent Labs    11/21/20 1439  PROBNP 526.0*   HbA1C: No results for input(s): HGBA1C in the last 72 hours. CBG: No results for input(s): GLUCAP in the last 168 hours. Lipid Profile: No results for input(s): CHOL, HDL, LDLCALC, TRIG, CHOLHDL, LDLDIRECT in the last 72 hours. Thyroid Function Tests: No results for input(s): TSH, T4TOTAL, FREET4, T3FREE, THYROIDAB in the last 72 hours. Anemia Panel: No results for input(s): VITAMINB12, FOLATE, FERRITIN, TIBC, IRON, RETICCTPCT in the last 72 hours. Sepsis Labs: Recent Labs  Lab 04/16/2021 1623 04/19/2021 2341 04/07/21 0204 04/07/21 0553  LATICACIDVEN 1.1 2.4* 2.4* 1.8    Recent Results (from the past 240 hour(s))  Blood Culture (routine x 2)     Status: Abnormal (Preliminary  result)   Collection Time: 04/27/2021  4:23 PM   Specimen: BLOOD  Result Value Ref Range Status   Specimen Description   Final    BLOOD RIGHT ANTECUBITAL Performed at [facility], [place] [address], [place], [place] [address]    Special Requests   Final    BOTTLES DRAWN AEROBIC AND ANAEROBIC Blood Culture adequate volume Performed at [facility], [address] [place], [place] [address]    Culture  Setup Time   Final    GRAM NEGATIVE RODS AEROBIC BOTTLE ONLY Organism ID to follow CRITICAL RESULT CALLED TO, READ BACK BY AND VERIFIED WITHSeleta Rhymes [facility] [id number] 04/07/21 A BROWNING Performed at [facility], [place] [address] [place], [place] [address]    Culture PSEUDOMONAS AERUGINOSA (A)  Final   Report Status PENDING  Incomplete  Blood Culture (routine x 2)     Status: None (Preliminary result)   Collection Time: 04/10/2021  4:23 PM   Specimen: BLOOD  Result Value Ref Range Status   Specimen Description   Final    BLOOD LEFT ANTECUBITAL Performed at [facility], [place] [address] [place], [place] [address]    Special Requests   Final    BOTTLES DRAWN AEROBIC AND ANAEROBIC Blood Culture adequate volume Performed at [facility], [address] [place], [place] [address]    Culture   Final    NO GROWTH 3 DAYS Performed at [facility], [place] [address] [place], [place] [address]    Report Status PENDING  Incomplete  Blood Culture ID Panel (Reflexed)     Status: Abnormal   Collection Time: 04/24/2021  4:23 PM  Result Value Ref Range Status   Enterococcus faecalis NOT DETECTED NOT DETECTED Final   Enterococcus Faecium NOT DETECTED NOT DETECTED Final   Listeria monocytogenes NOT DETECTED NOT DETECTED Final   Staphylococcus species NOT DETECTED NOT DETECTED Final   Staphylococcus aureus (BCID) NOT DETECTED NOT DETECTED Final   Staphylococcus epidermidis NOT DETECTED NOT DETECTED Final   Staphylococcus lugdunensis NOT DETECTED NOT DETECTED  Final   Streptococcus species NOT DETECTED NOT DETECTED Final   Streptococcus agalactiae  NOT DETECTED NOT DETECTED Final   Streptococcus pneumoniae NOT DETECTED NOT DETECTED Final   Streptococcus pyogenes NOT DETECTED NOT DETECTED Final   A.calcoaceticus-baumannii NOT DETECTED NOT DETECTED Final   Bacteroides fragilis NOT DETECTED NOT DETECTED Final   Enterobacterales NOT DETECTED NOT DETECTED Final   Enterobacter cloacae complex NOT DETECTED NOT DETECTED Final   Escherichia coli NOT DETECTED NOT DETECTED Final   Klebsiella aerogenes NOT DETECTED NOT DETECTED Final   Klebsiella oxytoca NOT DETECTED NOT DETECTED Final   Klebsiella pneumoniae NOT DETECTED NOT DETECTED Final   Proteus species NOT DETECTED NOT DETECTED Final   Salmonella species NOT DETECTED NOT DETECTED Final   Serratia marcescens NOT DETECTED NOT DETECTED Final   Haemophilus influenzae NOT DETECTED NOT DETECTED Final   Neisseria meningitidis NOT DETECTED NOT DETECTED Final   Pseudomonas aeruginosa DETECTED (A) NOT DETECTED Final    Comment: CRITICAL RESULT CALLED TO, READ BACK BY AND VERIFIED WITH: Seleta Rhymes PHARMD [id number] 04/07/21 A BROWNING    Stenotrophomonas maltophilia NOT DETECTED NOT DETECTED Final   Candida albicans NOT DETECTED NOT DETECTED Final   Candida auris NOT DETECTED NOT DETECTED Final   Candida glabrata NOT DETECTED NOT DETECTED Final   Candida krusei NOT DETECTED NOT DETECTED Final   Candida parapsilosis NOT DETECTED NOT DETECTED Final   Candida tropicalis NOT DETECTED NOT DETECTED Final   Cryptococcus neoformans/gattii NOT DETECTED NOT DETECTED Final   CTX-M ESBL NOT DETECTED NOT DETECTED Final   Carbapenem resistance IMP NOT DETECTED NOT DETECTED Final   Carbapenem resistance KPC NOT DETECTED NOT DETECTED Final   Carbapenem resistance NDM NOT DETECTED NOT DETECTED Final   Carbapenem resistance VIM NOT DETECTED NOT DETECTED Final    Comment: Performed at [facility], [address],  [place], [place] [address]  Urine culture     Status: None   Collection Time: 03/31/2021  4:26 PM   Specimen: Urine, Random  Result Value Ref Range Status   Specimen Description   Final    URINE, RANDOM Performed at [facility], [address] [place], [place] [address]    Special Requests   Final    NONE Performed at [facility], [address] [place], [place] [address]    Culture   Final    NO GROWTH Performed at [facility], [place] [address] [place], [place] [address]    Report Status 04/08/2021 FINAL  Final  Resp Panel by RT-PCR (Flu A&B, Covid) Nasopharyngeal Swab     Status: None   Collection Time: 04/21/2021  8:00 PM   Specimen: Nasopharyngeal Swab; Nasopharyngeal(NP) swabs in vial transport medium  Result Value Ref Range Status   SARS Coronavirus 2 by RT PCR NEGATIVE NEGATIVE Final    Comment: (NOTE) SARS-CoV-2 target nucleic acids are NOT DETECTED.  The SARS-CoV-2 RNA is generally detectable in upper respiratory specimens during the acute phase of infection. The lowest concentration of SARS-CoV-2 viral copies this assay can detect is 138 copies/mL. A negative result does not preclude SARS-Cov-2 infection and should not be used as the sole basis for treatment or other patient management decisions. A negative result may occur with  improper specimen collection/handling, submission of specimen other than nasopharyngeal swab, presence of viral mutation(s) within the areas targeted by this assay, and inadequate number of viral copies(<138 copies/mL). A negative result must be combined with clinical observations, patient history, and epidemiological information. The expected result is Negative.  Fact Sheet for  Patients:  EntrepreneurPulse.com.au  Fact Sheet for Healthcare Providers:  IncredibleEmployment.be  This test is no t yet approved or cleared by the [place] FDA and  has been authorized for  detection and/or diagnosis of SARS-CoV-2 by FDA under an Emergency Use Authorization (EUA). This EUA will remain  in effect (meaning this test can be used) for the duration of the COVID-19 declaration under Section 564(b)(1) of the Act, 21 U.S.C.section 360bbb-3(b)(1), unless the authorization is terminated  or revoked sooner.       Influenza A by PCR NEGATIVE NEGATIVE Final   Influenza B by PCR NEGATIVE NEGATIVE Final    Comment: (NOTE) The Xpert Xpress SARS-CoV-2/FLU/RSV plus assay is intended as an aid in the diagnosis of influenza from Nasopharyngeal swab specimens and should not be used as a sole basis for treatment. Nasal washings and aspirates are unacceptable for Xpert Xpress SARS-CoV-2/FLU/RSV testing.  Fact Sheet for Patients: EntrepreneurPulse.com.au  Fact Sheet for Healthcare Providers: IncredibleEmployment.be  This test is not yet approved or cleared by the [place] FDA and has been authorized for detection and/or diagnosis of SARS-CoV-2 by FDA under an Emergency Use Authorization (EUA). This EUA will remain in effect (meaning this test can be used) for the duration of the COVID-19 declaration under Section 564(b)(1) of the Act, 21 U.S.C. section 360bbb-3(b)(1), unless the authorization is terminated or revoked.  Performed at [facility], [address] [place], [place] [address]   Culture, blood (routine x 2)     Status: None (Preliminary result)   Collection Time: 04/08/21  8:49 AM   Specimen: BLOOD  Result Value Ref Range Status   Specimen Description   Final    BLOOD RIGHT ANTECUBITAL Performed at [facility] [address] [place], [place] [address]    Special Requests   Final    BOTTLES DRAWN AEROBIC AND ANAEROBIC Blood Culture results may not be optimal due to an inadequate volume of blood received in culture bottles Performed at [facility] [address], [place], [place] [address]    Culture   Final    NO GROWTH < 24 HOURS Performed at [facility] [address], [place], [place] [address]    Report Status PENDING  Incomplete  Culture, blood (routine x 2)     Status: None (Preliminary result)   Collection Time: 04/08/21  8:49 AM   Specimen: BLOOD RIGHT HAND  Result Value Ref Range Status   Specimen Description   Final    BLOOD RIGHT HAND Performed at [facility] [address], [place], [place] [address]    Special Requests   Final    BOTTLES DRAWN AEROBIC AND ANAEROBIC Blood Culture adequate volume Performed at [facility] [address], [place], [place] [address]    Culture   Final    NO GROWTH < 24 HOURS Performed at [facility] [address], [place],  [address]    Report Status PENDING  Incomplete     Radiology Studies: No results found.  Scheduled Meds: . citalopram  20 mg Oral QHS  . clopidogrel  75 mg Oral Daily  . cycloSPORINE  2 drop Both Eyes BID  . folic acid  1 mg Oral Daily  . heparin  5,000 Units Subcutaneous Q8H  . levothyroxine  50 mcg Oral Q0600  . macitentan  10 mg Oral Daily  . magnesium oxide  200 mg Oral BID  .  multivitamin with minerals  1 tablet Oral Daily  . nutrition supplement (JUVEN)  1 packet Oral BID WC  . pantoprazole  40 mg Oral BID  . Ensure Max Protein  11 oz Oral Daily  . torsemide  20 mg Oral QHS  . torsemide  40 mg Oral Daily  . vitamin B-12  1,000 mcg Oral Daily   Continuous Infusions: . sodium chloride 50 mL/hr at 04/09/21 0510  . ceFEPime (MAXIPIME) IV 2 g (04/09/21 0647)     LOS: 3 days   Marylu Lund, MD [facility] Pager On Amion  If 7PM-7AM, please contact night-coverage 04/09/2021, 3:24 PM

## 2021-04-09 NOTE — Progress Notes (Signed)
Patient today is refusing to eat and take any medication PO. Would not allow anything by mouth. Yesterday patient would only take a couple of bites of food.

## 2021-04-10 ENCOUNTER — Telehealth (HOSPITAL_COMMUNITY): Payer: Self-pay | Admitting: *Deleted

## 2021-04-10 DIAGNOSIS — R7881 Bacteremia: Secondary | ICD-10-CM | POA: Diagnosis not present

## 2021-04-10 DIAGNOSIS — N179 Acute kidney failure, unspecified: Secondary | ICD-10-CM | POA: Diagnosis not present

## 2021-04-10 DIAGNOSIS — B965 Pseudomonas (aeruginosa) (mallei) (pseudomallei) as the cause of diseases classified elsewhere: Secondary | ICD-10-CM | POA: Diagnosis not present

## 2021-04-10 DIAGNOSIS — E43 Unspecified severe protein-calorie malnutrition: Secondary | ICD-10-CM

## 2021-04-10 DIAGNOSIS — M341 CR(E)ST syndrome: Secondary | ICD-10-CM

## 2021-04-10 DIAGNOSIS — I739 Peripheral vascular disease, unspecified: Secondary | ICD-10-CM

## 2021-04-10 DIAGNOSIS — J849 Interstitial pulmonary disease, unspecified: Secondary | ICD-10-CM | POA: Diagnosis not present

## 2021-04-10 DIAGNOSIS — M86672 Other chronic osteomyelitis, left ankle and foot: Secondary | ICD-10-CM

## 2021-04-10 DIAGNOSIS — I272 Pulmonary hypertension, unspecified: Secondary | ICD-10-CM

## 2021-04-10 DIAGNOSIS — J9611 Chronic respiratory failure with hypoxia: Secondary | ICD-10-CM | POA: Diagnosis not present

## 2021-04-10 LAB — COMPREHENSIVE METABOLIC PANEL
ALT: 52 U/L — ABNORMAL HIGH (ref 0–44)
AST: 67 U/L — ABNORMAL HIGH (ref 15–41)
Albumin: 2.2 g/dL — ABNORMAL LOW (ref 3.5–5.0)
Alkaline Phosphatase: 135 U/L — ABNORMAL HIGH (ref 38–126)
Anion gap: 22 — ABNORMAL HIGH (ref 5–15)
BUN: 45 mg/dL — ABNORMAL HIGH (ref 8–23)
CO2: 14 mmol/L — ABNORMAL LOW (ref 22–32)
Calcium: 8.4 mg/dL — ABNORMAL LOW (ref 8.9–10.3)
Chloride: 104 mmol/L (ref 98–111)
Creatinine, Ser: 1.5 mg/dL — ABNORMAL HIGH (ref 0.44–1.00)
GFR, Estimated: 37 mL/min — ABNORMAL LOW (ref >60)
Glucose, Bld: 77 mg/dL (ref 70–99)
Potassium: 3.4 mmol/L — ABNORMAL LOW (ref 3.5–5.1)
Sodium: 140 mmol/L (ref 135–145)
Total Bilirubin: 1.6 mg/dL — ABNORMAL HIGH (ref 0.3–1.2)
Total Protein: 5.9 g/dL — ABNORMAL LOW (ref 6.5–8.1)

## 2021-04-10 LAB — CBC
HCT: 25.7 % — ABNORMAL LOW (ref 36.0–46.0)
Hemoglobin: 7.8 g/dL — ABNORMAL LOW (ref 12.0–15.0)
MCH: 30.4 pg (ref 26.0–34.0)
MCHC: 30.4 g/dL (ref 30.0–36.0)
MCV: 100 fL (ref 80.0–100.0)
Platelets: 334 10*3/uL (ref 150–400)
RBC: 2.57 MIL/uL — ABNORMAL LOW (ref 3.87–5.11)
RDW: 19.5 % — ABNORMAL HIGH (ref 11.5–15.5)
WBC: 15.1 10*3/uL — ABNORMAL HIGH (ref 4.0–10.5)
nRBC: 0.3 % — ABNORMAL HIGH (ref 0.0–0.2)

## 2021-04-10 LAB — CULTURE, BLOOD (ROUTINE X 2): Special Requests: ADEQUATE

## 2021-04-10 LAB — CORTISOL-AM, BLOOD: Cortisol - AM: 29.4 ug/dL — ABNORMAL HIGH (ref 6.7–22.6)

## 2021-04-10 LAB — LACTIC ACID, PLASMA: Lactic Acid, Venous: 1.8 mmol/L (ref 0.5–1.9)

## 2021-04-10 MED ORDER — PANTOPRAZOLE SODIUM 40 MG IV SOLR
40.0000 mg | INTRAVENOUS | Status: DC
  Administered 2021-04-10 – 2021-04-16 (×7): 40 mg via INTRAVENOUS
  Filled 2021-04-10 (×7): qty 40

## 2021-04-10 MED ORDER — SODIUM CHLORIDE 0.9 % IV SOLN: INTRAVENOUS | Status: DC

## 2021-04-10 MED ORDER — ENSURE ENLIVE PO LIQD: 237.0000 mL | Freq: Two times a day (BID) | ORAL | Status: DC

## 2021-04-10 MED ORDER — POTASSIUM CHLORIDE CRYS ER 20 MEQ PO TBCR: 60.0000 meq | EXTENDED_RELEASE_TABLET | Freq: Once | ORAL | Status: DC

## 2021-04-10 MED ORDER — HYDROCORTISONE NA SUCCINATE PF 100 MG IJ SOLR
50.0000 mg | Freq: Every day | INTRAMUSCULAR | Status: DC
  Administered 2021-04-10 – 2021-04-16 (×7): 50 mg via INTRAVENOUS
  Filled 2021-04-10 (×7): qty 2

## 2021-04-10 MED ORDER — POTASSIUM CHLORIDE 10 MEQ/100ML IV SOLN
10.0000 meq | INTRAVENOUS | Status: AC
  Administered 2021-04-10 (×6): 10 meq via INTRAVENOUS
  Filled 2021-04-10 (×6): qty 100

## 2021-04-10 NOTE — Progress Notes (Signed)
[facility] for Infectious Disease  Date of Admission:  04/26/2021           Reason for visit: Follow up on PsA bacteremia and OM  Current antibiotics: Cefepime 4/7--present   Previous antibiotics: Vancomycin 4/7--4/8  ASSESSMENT:    1. 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  Blood cultures 04/24/2021 are positive for Pseudomonas aeruginosa.  Repeat blood cultures 04/08/2021 no growth to date.  Per chart review patient has previously declined recommendation for amputation 2. 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 Acute kidney injury on CKD: Creatinine increased today up to 1.5 with creatinine clearance approximately 29 4. Peripheral vascular disease: Status post multiple stents and ablations previously 5. CREST 6. Pulmonary hypertension on continuous oxygen  PLAN:    . Continue cefepime renally dosed for now . Continue wound care . Baseline ESR/CRP in the morning . Unlikely to heal these wounds without more definitive source control such as amputation, however, patient has previously declined this recommendation . Discussed with patients husband that will likely require prolonged antibiotic course given bacteremia complicated by likely osteomyelitis of her left foot . Consider goals of care/palliative consultation  Principal Problem:   Cellulitis, leg Active Problems:   Peripheral arterial occlusive disease (HCC)   Pulmonary hypertension associated with systemic disorder (HCC)   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HCC)   Scleroderma (HCC)   Chronic respiratory failure with hypoxia (HCC)   ILD (interstitial lung disease) (HCC)   AKI (acute kidney injury) (HCC)   PAF (paroxysmal atrial fibrillation) (HCC)   Wound cellulitis   SIRS (systemic inflammatory response syndrome) (HCC)   Pseudomonas aeruginosa  infection    MEDICATIONS:    Scheduled Meds: . citalopram  20 mg Oral QHS  . clopidogrel  75 mg Oral Daily  . cycloSPORINE  2 drop Both Eyes BID  . feeding supplement  237 mL Oral BID BM  . folic acid  1 mg Oral Daily  . heparin  5,000 Units Subcutaneous Q8H  . hydrocortisone sod succinate (SOLU-CORTEF) inj  50 mg Intravenous Daily  . levothyroxine  50 mcg Oral Q0600  . macitentan  10 mg Oral Daily  . magnesium oxide  200 mg Oral BID  . multivitamin with minerals  1 tablet Oral Daily  . nutrition supplement (JUVEN)  1 packet Oral BID WC  . pantoprazole (PROTONIX) IV  40 mg Intravenous Q24H  . vitamin B-12  1,000 mcg Oral Daily   Continuous Infusions: . sodium chloride 50 mL/hr at 04/09/21 0510  . sodium chloride 75 mL/hr at 04/10/21 0821  . ceFEPime (MAXIPIME) IV 2 g (04/10/21 0617)  . potassium chloride 10 mEq (04/10/21 1218)   PRN Meds:.sodium chloride, acetaminophen, HYDROmorphone (DILAUDID) injection, morphine injection, oxyCODONE-acetaminophen  SUBJECTIVE:   24 hour events:  No acute events noted  Patient is lethargic this morning after receiving Dilaudid.  Her husband is present in the room.  Review of Systems  Unable to perform ROS: Mental acuity      OBJECTIVE:   Blood pressure (!) 141/93, pulse 90, temperature 97.8 F (36.6 C), temperature source Oral, resp. rate 18, height 5' 7.5" (1.715 m), weight 53.9 kg, SpO2 98 %. Body mass index is 18.34 kg/m.  Physical Exam Constitutional:      Comments: Cachectic, chronically ill-appearing woman, lying in bed, no acute distress  HENT:  Head: Normocephalic and atraumatic.  Eyes:     Extraocular Movements: Extraocular movements intact.     Conjunctiva/sclera: Conjunctivae normal.  Pulmonary:     Effort: Pulmonary effort is normal. No respiratory distress.     Comments: On nasal cannula Abdominal:     General: There is no distension.     Palpations: Abdomen is soft.  Skin:    Comments: Pictures of  wounds reviewed in the media tab  Neurological:     General: No focal deficit present.      Lab Results: Lab Results  Component Value Date   WBC 15.1 (H) 04/10/2021   HGB 7.8 (L) 04/10/2021   HCT 25.7 (L) 04/10/2021   MCV 100.0 04/10/2021   PLT 334 04/10/2021    Lab Results  Component Value Date   NA 140 04/10/2021   K 3.4 (L) 04/10/2021   CO2 14 (L) 04/10/2021   GLUCOSE 77 04/10/2021   BUN 45 (H) 04/10/2021   CREATININE 1.50 (H) 04/10/2021   CALCIUM 8.4 (L) 04/10/2021   GFRNONAA 37 (L) 04/10/2021   GFRAA 50 (L) 01/10/2021    Lab Results  Component Value Date   ALT 52 (H) 04/10/2021   AST 67 (H) 04/10/2021   ALKPHOS 135 (H) 04/10/2021   BILITOT 1.6 (H) 04/10/2021       Component Value Date/Time   CRP 60.3 (H) 02/20/2021 0000       Component Value Date/Time   ESRSEDRATE >140 (H) 01/26/2021 1508     I have reviewed the micro and lab results in Epic.  Imaging: No results found.    [facility] [phone] pager 04/10/2021, 12:20 PM  I spent greater than 35 minutes with the patient including greater than 50% of time in face to face counsel of the patient and in coordination of their care.

## 2021-04-10 NOTE — Care Management Important Message (Signed)
Important Message  Patient Details IM Letter given to the Patient. Name: [name] [name] MRN: [id number] Date of Birth: 12/03/1951   Medicare Important Message Given:  Yes     Kerin Salen 04/10/2021, 10:49 AM

## 2021-04-10 NOTE — Progress Notes (Signed)
PROGRESS NOTE    [name] [name]  JHE:[id number] DOB: 07-12-1951 DOA: 04/01/2021 PCP: [name], MD    Brief Narrative:  70 y.o. 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   Assessment & Plan:   Principal Problem:   Cellulitis, leg Active Problems:   Peripheral arterial occlusive disease (HCC)   Pulmonary hypertension associated with systemic disorder (HCC)   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HCC)   Scleroderma (HCC)   Chronic respiratory failure with hypoxia (HCC)   ILD (interstitial lung disease) (HCC)   AKI (acute kidney injury) ([place])   PAF (paroxysmal atrial fibrillation) (HCC)   Wound cellulitis   SIRS (systemic inflammatory response syndrome) (HCC)   Pseudomonas aeruginosa infection  Chronic wounds of bilateral lower extremity with pseudomonas bacteremia pt presented with WBC of 22.8, lactate of 2.4 Previously had Enterococcus faecalis and Pseudomonas on wound culture Pt was initially started on IV vancomycin and cefepime Patient has previously been seen by ID and was on a short course of IV cefepime but discontinued due to lack of response and that she is a chronic Pseudomonas colonizer.  Patient has failed treatment with wound care, plastic surgery and has already been optimized for her PAD.   Chart reviewed. Numerous specialists have recommended orthopedic referral for amputation, however patient had previously expressed wish against amputations Blood cultures are pos for Pseudomonas, had consulted ID for recs,  appreciate recs. Pt is continued on cefepime, likely will require prolonged abx therapy per ID Recommendation for Palliative Care, will place consult for [place]  Likely osteomyelitis of the left 4 and 5th digit MRI foot reviewed. Findings notable for abnormal edema distally in the prox phalanx of the small toe suspicious for active osteomyelitis.  -Had earlier discussed with ID who is familiar with pt. Rec to hold off on surgical consult and to continue with wound care as per [facility] specialists  Lactic acidosis Noted to have trended down with IVF  AKI on CKD stage 3 Renal function now worsening, likely secondary to poor PO intake Have discontinued diuretic tx, continue on gentle IVF Repeat bmet in AM  Anemia of chronic disease/iron deficiency has hx of GI bleed nursing noted dark stool although pt also on iron supplementation Hgb is trending up. Family and staff reports no evidence of acute blood loss currently  Hyponatremia Improving with fluids. -Repeat bmet in AM  Interstitial lung disease with chronic hypoxemia Continue baseline O2 of 2 to 3 L Follows with outpatient pulmonology  Pulmonary hypertension continue on Revatio and macitentan Had been continued on torsemide prior to admit, now on hold given concerns of dehydration  Scleroderma Had been on chronic prednisone, currently on hold given limited PO intake Cont on IV steroid for now until pt can tolerate PO reliably  PAD Had been continued with Plavix as tolerated  Paroxysmal atrial fibrillation Not on anticoagulation due to previous GI bleed  History of DVT Has IVC filter in place Seems to be stable at this time  Hyperlipidemia Off statin due to myalgia  Toxic metabolic encephalopathy -Suspect secondary to dehydration -Continue gentle IVF  as tolerated  Hyopkalemia -Will replace  -Repeat lytes in AM  DVT prophylaxis: Heparin subq Code Status: Full Family Communication: Pt in room, family at  bedside  Status is: Inpatient  Remains inpatient appropriate because:IV treatments appropriate due to intensity of illness or inability to take PO and Inpatient level of care appropriate due to severity of illness   Dispo: The patient is from: Home              Anticipated d/c is to: Home              Patient currently is not medically stable to d/c.   Difficult to place patient No   Consultants:   ID  Palliative Care  Procedures:     Antimicrobials: Anti-infectives (From admission, onward)   Start     Dose/Rate Route Frequency Ordered Stop   04/08/21 0600  vancomycin (VANCOREADY) IVPB 1000 mg/200 mL  Status:  Discontinued        1,000 mg 200 mL/hr over 60 Minutes Intravenous Every 36 hours 04/07/21 0059 04/08/21 0828   04/07/21 0600  ceFEPIme (MAXIPIME) 2 g in sodium chloride 0.9 % 100 mL IVPB        2 g 200 mL/hr over 30 Minutes Intravenous Every 12 hours 04/07/21 0059     04/02/2021 1715  vancomycin (VANCOCIN) IVPB 1000 mg/200 mL premix        1,000 mg 200 mL/hr over 60 Minutes Intravenous  Once 04/02/2021 1711 04/04/2021 2045   04/08/2021 1715  ceFEPIme (MAXIPIME) 2 g in sodium chloride 0.9 % 100 mL IVPB        2 g 200 mL/hr over 30 Minutes Intravenous  Once 04/01/2021 1711 04/20/2021 1800      Subjective: Unable to assess given mentation  Objective: Vitals:   04/10/21 0719 04/10/21 0743 04/10/21 0746 04/10/21 1328  BP:    (!) 162/72  Pulse:    94  Resp:    20  Temp:    97.7 F (36.5 C)  TempSrc:    Oral  SpO2: 98% 98% 98% 96%  Weight:      Height:        Intake/Output Summary (Last 24 hours) at 04/10/2021 1610 Last data filed at 04/10/2021 1430 Gross per 24 hour  Intake 549.56 ml  Output 400 ml  Net 149.56 ml   Filed Weights   04/12/2021 1502 04/18/2021 2300  Weight: 54 kg 53.9 kg    Examination: General exam: Asleep, difficult to arouse, laying in bed, in nad Respiratory system: Normal respiratory effort, no wheezing Cardiovascular system: regular rate, s1,  s2 Gastrointestinal system: Soft, nondistended, positive BS Central nervous system: CN2-12 grossly intact, strength intact Extremities: Perfused, no clubbing Skin: Normal skin turgor, no notable skin lesions seen Psychiatry: unable to assess given mentation   Data Reviewed: I have personally reviewed following labs and imaging studies  CBC: Recent Labs  Lab 04/26/2021 1539 04/07/21 0006 04/08/21 0523 04/08/21 1234 04/09/21 0615 04/10/21 0648  WBC 22.8* 18.3* 15.8* 16.7* 17.7* 15.1*  NEUTROABS 20.8*  --   --   --   --   --   HGB 8.0* 9.1* 7.0* 7.2* 8.2* 7.8*  HCT 25.3* 29.2* 22.6* 23.3* 26.3* 25.7*  MCV 96.9 99.7 97.8 98.3 96.7 100.0  PLT 364 365 408* 341 408* 017   Basic Metabolic Panel: Recent Labs  Lab 04/14/2021 1623 04/07/21 0006 04/08/21 0523 04/09/21 0615 04/09/21 2202 04/10/21 0648  NA  --  130* 134* 135 142 140  K  --  5.2* 4.3 3.9 4.1 3.4*  CL  --  94* 98 99 101 104  CO2  --  24 21* 17* 16* 14*  GLUCOSE  --  90 76 72 75 77  BUN  --  44* 45* 46* 44* 45*  CREATININE  --  1.16* 1.24* 1.14* 1.44* 1.50*  CALCIUM  --  8.4* 8.3* 8.4* 8.8* 8.4*  MG 2.0  --   --   --   --   --    GFR: Estimated Creatinine Clearance: 29.3 mL/min (A) (by C-G formula based on SCr of 1.5 mg/dL (H)). Liver Function Tests: Recent Labs  Lab 04/01/2021 1539 04/08/21 0523 04/09/21 0615 04/09/21 2202 04/10/21 0648  AST 26 92* 97* 85* 67*  ALT 17 52* 60* 55* 52*  ALKPHOS 119 113 153* 147* 135*  BILITOT 0.2* 0.4 0.8 1.4* 1.6*  PROT 6.4* 5.7* 5.8* 6.3* 5.9*  ALBUMIN 2.6* 2.1* 2.2* 2.3* 2.2*   No results for input(s): LIPASE, AMYLASE in the last 168 hours. Recent Labs  Lab 04/09/21 2202  AMMONIA 24   Coagulation Profile: Recent Labs  Lab 04/21/2021 1539  INR 0.9   Cardiac Enzymes: No results for input(s): CKTOTAL, CKMB, CKMBINDEX, TROPONINI in the last 168 hours. BNP (last 3 results) Recent Labs    11/21/20 1439  PROBNP 526.0*   HbA1C: No results for input(s): HGBA1C in the  last 72 hours. CBG: No results for input(s): GLUCAP in the last 168 hours. Lipid Profile: No results for input(s): CHOL, HDL, LDLCALC, TRIG, CHOLHDL, LDLDIRECT in the last 72 hours. Thyroid Function Tests: No results for input(s): TSH, T4TOTAL, FREET4, T3FREE, THYROIDAB in the last 72 hours. Anemia Panel: No results for input(s): VITAMINB12, FOLATE, FERRITIN, TIBC, IRON, RETICCTPCT in the last 72 hours. Sepsis Labs: Recent Labs  Lab 04/23/2021 2341 04/07/21 0204 04/07/21 0553 04/09/21 2202  LATICACIDVEN 2.4* 2.4* 1.8 1.8    Recent Results (from the past 240 hour(s))  Blood Culture (routine x 2)     Status: Abnormal   Collection Time: 04/08/2021  4:23 PM   Specimen: BLOOD  Result Value Ref Range Status   Specimen Description   Final    BLOOD RIGHT ANTECUBITAL Performed at [facility], [place] [address] [place], [place] [address]    Special Requests   Final    BOTTLES DRAWN AEROBIC AND ANAEROBIC Blood Culture adequate volume Performed at [facility], [address] [place], [place] [address]    Culture  Setup Time   Final    GRAM NEGATIVE RODS AEROBIC BOTTLE ONLY Organism ID to follow CRITICAL RESULT CALLED TO, READ BACK BY AND VERIFIED WITHSeleta Rhymes [facility] 4332 04/07/21 A BROWNING Performed at [facility], [place] [address] [place], [place] [address]    Culture PSEUDOMONAS AERUGINOSA (A)  Final   Report Status 04/10/2021 FINAL  Final   Organism ID, Bacteria PSEUDOMONAS AERUGINOSA  Final      Susceptibility   Pseudomonas aeruginosa - MIC*    CEFTAZIDIME 4 SENSITIVE Sensitive     CIPROFLOXACIN <=0.25 SENSITIVE Sensitive     GENTAMICIN <=1 SENSITIVE Sensitive     IMIPENEM 2 SENSITIVE Sensitive     PIP/TAZO <=4 SENSITIVE Sensitive     CEFEPIME 2 SENSITIVE Sensitive     * PSEUDOMONAS AERUGINOSA  Blood Culture (routine x 2)     Status: None (Preliminary result)   Collection Time: 04/14/2021  4:23 PM   Specimen: BLOOD  Result  Value Ref Range Status    Specimen Description   Final    BLOOD LEFT ANTECUBITAL Performed at [facility], [place] [address] [place], [place] [address]    Special Requests   Final    BOTTLES DRAWN AEROBIC AND ANAEROBIC Blood Culture adequate volume Performed at [facility], [address] [place], [place] [address]    Culture   Final    NO GROWTH 4 DAYS Performed at [facility], [place] [address] [place], [place] [address]    Report Status PENDING  Incomplete  Blood Culture ID Panel (Reflexed)     Status: Abnormal   Collection Time: 04/16/2021  4:23 PM  Result Value Ref Range Status   Enterococcus faecalis NOT DETECTED NOT DETECTED Final   Enterococcus Faecium NOT DETECTED NOT DETECTED Final   Listeria monocytogenes NOT DETECTED NOT DETECTED Final   Staphylococcus species NOT DETECTED NOT DETECTED Final   Staphylococcus aureus (BCID) NOT DETECTED NOT DETECTED Final   Staphylococcus epidermidis NOT DETECTED NOT DETECTED Final   Staphylococcus lugdunensis NOT DETECTED NOT DETECTED Final   Streptococcus species NOT DETECTED NOT DETECTED Final   Streptococcus agalactiae NOT DETECTED NOT DETECTED Final   Streptococcus pneumoniae NOT DETECTED NOT DETECTED Final   Streptococcus pyogenes NOT DETECTED NOT DETECTED Final   A.calcoaceticus-baumannii NOT DETECTED NOT DETECTED Final   Bacteroides fragilis NOT DETECTED NOT DETECTED Final   Enterobacterales NOT DETECTED NOT DETECTED Final   Enterobacter cloacae complex NOT DETECTED NOT DETECTED Final   Escherichia coli NOT DETECTED NOT DETECTED Final   Klebsiella aerogenes NOT DETECTED NOT DETECTED Final   Klebsiella oxytoca NOT DETECTED NOT DETECTED Final   Klebsiella pneumoniae NOT DETECTED NOT DETECTED Final   Proteus species NOT DETECTED NOT DETECTED Final   Salmonella species NOT DETECTED NOT DETECTED Final   Serratia marcescens NOT DETECTED NOT DETECTED Final   Haemophilus influenzae NOT DETECTED NOT DETECTED Final   Neisseria meningitidis  NOT DETECTED NOT DETECTED Final   Pseudomonas aeruginosa DETECTED (A) NOT DETECTED Final    Comment: CRITICAL RESULT CALLED TO, READ BACK BY AND VERIFIED WITH: Seleta Rhymes PHAR[facility] [id number] 04/07/21 A BROWNING    Stenotrophomonas maltophilia NOT DETECTED NOT DETECTED Final   Candida albicans NOT DETECTED NOT DETECTED Final   Candida auris NOT DETECTED NOT DETECTED Final   Candida glabrata NOT DETECTED NOT DETECTED Final   Candida krusei NOT DETECTED NOT DETECTED Final   Candida parapsilosis NOT DETECTED NOT DETECTED Final   Candida tropicalis NOT DETECTED NOT DETECTED Final   Cryptococcus neoformans/gattii NOT DETECTED NOT DETECTED Final   CTX-M ESBL NOT DETECTED NOT DETECTED Final   Carbapenem resistance IMP NOT DETECTED NOT DETECTED Final   Carbapenem resistance KPC NOT DETECTED NOT DETECTED Final   Carbapenem resistance NDM NOT DETECTED NOT DETECTED Final   Carbapenem resistance VIM NOT DETECTED NOT DETECTED Final    Comment: Performed at [facility], [address], [place], [place] [address]  Urine culture     Status: None   Collection Time: 04/12/2021  4:26 PM   Specimen: Urine, Random  Result Value Ref Range Status   Specimen Description   Final    URINE, RANDOM Performed at [facility], [address] [place], [place] [address]    Special Requests   Final    NONE Performed at [facility], [address], [place], [place] [address]    Culture   Final  NO GROWTH Performed at [facility], [place] [address] [place], [place] [address]    Report Status 04/08/2021 FINAL  Final  Resp Panel by RT-PCR (Flu A&B, Covid) Nasopharyngeal Swab     Status: None   Collection Time: 04/29/2021  8:00 PM   Specimen: Nasopharyngeal Swab; Nasopharyngeal(NP) swabs in vial transport medium  Result Value Ref Range Status   SARS Coronavirus 2 by RT PCR NEGATIVE NEGATIVE Final    Comment: (NOTE) SARS-CoV-2 target nucleic acids are NOT DETECTED.  The SARS-CoV-2 RNA is  generally detectable in upper respiratory specimens during the acute phase of infection. The lowest concentration of SARS-CoV-2 viral copies this assay can detect is 138 copies/mL. A negative result does not preclude SARS-Cov-2 infection and should not be used as the sole basis for treatment or other patient management decisions. A negative result may occur with  improper specimen collection/handling, submission of specimen other than nasopharyngeal swab, presence of viral mutation(s) within the areas targeted by this assay, and inadequate number of viral copies(<138 copies/mL). A negative result must be combined with clinical observations, patient history, and epidemiological information. The expected result is Negative.  Fact Sheet for Patients:  EntrepreneurPulse.com.au  Fact Sheet for Healthcare Providers:  IncredibleEmployment.be  This test is no t yet approved or cleared by the [place] FDA and  has been authorized for detection and/or diagnosis of SARS-CoV-2 by FDA under an Emergency Use Authorization (EUA). This EUA will remain  in effect (meaning this test can be used) for the duration of the COVID-19 declaration under Section 564(b)(1) of the Act, 21 U.S.C.section 360bbb-3(b)(1), unless the authorization is terminated  or revoked sooner.       Influenza A by PCR NEGATIVE NEGATIVE Final   Influenza B by PCR NEGATIVE NEGATIVE Final    Comment: (NOTE) The Xpert Xpress SARS-CoV-2/FLU/RSV plus assay is intended as an aid in the diagnosis of influenza from Nasopharyngeal swab specimens and should not be used as a sole basis for treatment. Nasal washings and aspirates are unacceptable for Xpert Xpress SARS-CoV-2/FLU/RSV testing.  Fact Sheet for Patients: EntrepreneurPulse.com.au  Fact Sheet for Healthcare Providers: IncredibleEmployment.be  This test is not yet approved or cleared by the [place] FDA and has been authorized for detection and/or diagnosis of SARS-CoV-2 by FDA under an Emergency Use Authorization (EUA). This EUA will remain in effect (meaning this test can be used) for the duration of the COVID-19 declaration under Section 564(b)(1) of the Act, 21 U.S.C. section 360bbb-3(b)(1), unless the authorization is terminated or revoked.  Performed at [facility], [address] [place], [place] [address]   Culture, blood (routine x 2)     Status: None (Preliminary result)   Collection Time: 04/08/21  8:49 AM   Specimen: BLOOD  Result Value Ref Range Status   Specimen Description   Final    BLOOD RIGHT ANTECUBITAL Performed at [place] [address], [place], [place] [address]    Special Requests   Final    BOTTLES DRAWN AEROBIC AND ANAEROBIC Blood Culture results may not be optimal due to an inadequate volume of blood received in culture bottles Performed at [place] [address], [place], [place] [address]    Culture   Final    NO GROWTH 2 DAYS Performed at [place] [address], [place], [place] [address]    Report Status PENDING  Incomplete  Culture, blood (routine x 2)  Status: None (Preliminary result)   Collection Time: 04/08/21  8:49 AM   Specimen: BLOOD RIGHT HAND  Result Value Ref Range Status   Specimen Description   Final    BLOOD RIGHT HAND Performed at [place] [address] [place], [place] [address]    Special Requests   Final    BOTTLES DRAWN AEROBIC AND ANAEROBIC Blood Culture adequate volume Performed at [facility] [address], [place], [place] [address]    Culture   Final    NO GROWTH 2 DAYS Performed at [place] [address], [place], [place] [address]    Report Status PENDING  Incomplete     Radiology Studies: No results found.  Scheduled Meds: . citalopram  20 mg Oral QHS  .  clopidogrel  75 mg Oral Daily  . cycloSPORINE  2 drop Both Eyes BID  . feeding supplement  237 mL Oral BID BM  . folic acid  1 mg Oral Daily  . heparin  5,000 Units Subcutaneous Q8H  . hydrocortisone sod succinate (SOLU-CORTEF) inj  50 mg Intravenous Daily  . levothyroxine  50 mcg Oral Q0600  . macitentan  10 mg Oral Daily  . magnesium oxide  200 mg Oral BID  . multivitamin with minerals  1 tablet Oral Daily  . nutrition supplement (JUVEN)  1 packet Oral BID WC  . pantoprazole (PROTONIX) IV  40 mg Intravenous Q24H  . vitamin B-12  1,000 mcg Oral Daily   Continuous Infusions: . sodium chloride 50 mL/hr at 04/09/21 0510  . sodium chloride 75 mL/hr at 04/10/21 0821  . ceFEPime (MAXIPIME) IV 2 g (04/10/21 0617)  . potassium chloride 10 mEq (04/10/21 1452)     LOS: 4 days   Marylu Lund, MD [facility] Pager On Amion  If 7PM-7AM, please contact night-coverage 04/10/2021, 4:10 PM

## 2021-04-10 NOTE — Progress Notes (Signed)
Pharmacy Antibiotic Note  [name] [name] is a 70 y.o. female with hx scleroderma and chronic leg wounds with PsA colonization presented to the ED on 04/25/2021 with AMS.  Blood culture collected on 4/7 has PsA and left foot MRI showed findings with concern for osteomyelitis.  She's currently on cefepime for infection.  Today, 04/10/2021: - day #4 abx -  Afeb, wbc elevated - scr trending up 1.50 (crcl~29), RN reported that pt is not eating or drinking;  d/cing torsemide and starting NS _0  ml/hr today  Plan: - continue cefepime 2gm IV q12h for now - monitor renal function closely - f/u cultures  ___________________________________________  Height: 5' 7.5" (171.5 cm) Weight: 53.9 kg (118 lb 13.3 oz) IBW/kg (Calculated) : 62.75  Temp (24hrs), Avg:97.9 F (36.6 C), Min:97.8 F (36.6 C), Max:97.9 F (36.6 C)  Recent Labs  Lab 04/14/2021 1623 04/23/2021 2341 04/07/21 0006 04/07/21 0204 04/07/21 0553 04/08/21 0523 04/08/21 1234 04/09/21 0615 04/09/21 2202 04/10/21 0648  WBC  --   --  18.3*  --   --  15.8* 16.7* 17.7*  --  15.1*  CREATININE  --   --  1.16*  --   --  1.24*  --  1.14* 1.44* 1.50*  LATICACIDVEN 1.1 2.4*  --  2.4* 1.8  --   --   --  1.8  --     Estimated Creatinine Clearance: 29.3 mL/min (A) (by C-G formula based on SCr of 1.5 mg/dL (H)).    Allergies  Allergen Reactions  . Levofloxacin Other (See Comments)    FLU LIKE SYMPTOM   . Sulfa Antibiotics Rash and Other (See Comments)    Face peeled, Severe rash with significant peeling of face. No airway involvement per patient   . Doxycycline Hyclate Swelling    SWELLING REACTION UNSPECIFIED ; "TABLETS ONLY - CAPSULES ARE TOLERATED FINE"  . Penicillins Rash    Has patient had a PCN reaction causing immediate rash, facial/tongue/throat swelling, SOB or lightheadedness with hypotension: no Has patient had a PCN reaction causing severe rash involving mucus membranes or skin necrosis: no Has patient had a PCN reaction  that required hospitalization: no Has patient had a PCN reaction occurring within the last 10 years: {no If all of the above answers are "NO", then may proceed with Cephalosporin use.   Antimicrobials this admission: 4/7 vanc >>4/9 4/7 cefepime >>   Microbiology results: 4/7 BCx x2: 1/4 GNR, PsA 4/7 UCx: ngF 4/9 BCx2:  4/7 salmonella: neg    Thank you for allowing pharmacy to be a part of this patient's care.  Lynelle Doctor 04/10/2021 8:12 AM

## 2021-04-10 NOTE — Progress Notes (Addendum)
Initial Nutrition Assessment  DOCUMENTATION CODES:   Severe malnutrition in context of chronic illness,Underweight  INTERVENTION:   -Recommend placement of small bore feeding tube (please note: Cortrak not available at [facility]) Initiate Osmolite 1.2 @ 20 ml/hr, advance by 10 ml every 6 hours to goal rate of 60 ml/hr. Provides at goal 1728 kcals, 79g protein and 1180 ml H2O.  -Juven Fruit Punch BID via tube, each serving provides 95kcal and 2.5g of protein (amino acids glutamine and arginine)  -D/c Ensure Max, ordered Ensure Enlive instead  NEW NUTRITION DIAGNOSIS:   Severe Malnutrition related to chronic illness,wound healing as evidenced by severe fat depletion,severe muscle depletion.  GOAL:   Patient will meet greater than or equal to 90% of their needs  Not meeting, refusing POs.  MONITOR:   PO intake,Supplement acceptance,Labs,Weight trends,I & O's,Skin  REASON FOR ASSESSMENT:   Consult Assessment of nutrition requirement/status  ASSESSMENT:   70 y.o. 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  Patient in room, sleeping, was just given pain medications per husband at bedside. Pt's husband inquiring about pt's nutrition options. States pt has not eaten for 3 days, currently disoriented x 4. Pt has been refusing most POs. Given severe malnutrition, pt will likely benefit from temporary placement of small bore feeding tube to help meet increased needs from malnutrition and chronic wounds. Pt's husband seemed open  this.  PTA pt's husband states she was eating well at home with good appetite. Pt with increased needs from wounds.  Admission weight: 118 lbs.  Medications: Folic acid, MAG-OX, Multivitamin with minerals daily, Vitamin B-12, IV KCl,  Labs reviewed: Low K  NUTRITION - FOCUSED PHYSICAL EXAM:  Flowsheet Row Most Recent Value  Orbital Region Severe depletion  Upper Arm Region Severe depletion  Thoracic and Lumbar Region Unable to assess  Buccal Region Severe depletion  Temple Region Severe depletion  Clavicle Bone Region Severe depletion  Clavicle and Acromion Bone Region Severe depletion  Scapular Bone Region Severe depletion  Dorsal Hand Severe depletion  Patellar Region Unable to assess  [chronic wounds]  Anterior Thigh Region Unable to assess  Posterior Calf Region Unable to assess  Edema (RD Assessment) Moderate       Diet Order:   Diet Order            Diet Heart Room service appropriate? Yes; Fluid consistency: Thin  Diet effective now                 EDUCATION NEEDS:   Not appropriate for education at this time  Skin:  Skin Assessment: Skin Integrity Issues: Skin Integrity Issues:: Stage III,Other (Comment) Stage III: mid sacrum Other: left pre-tibial venous stasis ulcer  Last BM:  4/10  Height:   Ht Readings from Last 1 Encounters:  04/23/2021 5' 7.5" (1.715 m)    Weight:   Wt Readings from Last 1 Encounters:  04/03/2021 53.9 kg    BMI:  Body mass index is 18.34 kg/m.  Estimated Nutritional Needs:   Kcal:  1700-1900  Protein:  85-100g  Fluid:  1.9L/day  Clayton Bibles, MS, RD, LDN Inpatient Clinical Dietitian Contact information available via Amion

## 2021-04-10 NOTE — Telephone Encounter (Signed)
pts husband called to inform Dr.Bensimhon that patient is admitted to [facility] and ask that he review her chart.  Routed to [place]

## 2021-04-11 ENCOUNTER — Inpatient Hospital Stay (HOSPITAL_COMMUNITY): Payer: Medicare Other

## 2021-04-11 ENCOUNTER — Inpatient Hospital Stay (HOSPITAL_COMMUNITY)
Admit: 2021-04-11 | Discharge: 2021-04-11 | Disposition: A | Discharge status: Home / Self Care | Payer: Medicare Other | Attending: Internal Medicine | Admitting: Internal Medicine

## 2021-04-11 ENCOUNTER — Encounter (HOSPITAL_COMMUNITY): Payer: Self-pay | Admitting: Family Medicine

## 2021-04-11 DIAGNOSIS — L0390 Cellulitis, unspecified: Secondary | ICD-10-CM | POA: Diagnosis not present

## 2021-04-11 DIAGNOSIS — Z515 Encounter for palliative care: Secondary | ICD-10-CM | POA: Diagnosis not present

## 2021-04-11 DIAGNOSIS — E43 Unspecified severe protein-calorie malnutrition: Secondary | ICD-10-CM | POA: Diagnosis not present

## 2021-04-11 DIAGNOSIS — R52 Pain, unspecified: Secondary | ICD-10-CM

## 2021-04-11 DIAGNOSIS — I779 Disorder of arteries and arterioles, unspecified: Secondary | ICD-10-CM | POA: Diagnosis not present

## 2021-04-11 DIAGNOSIS — Z7189 Other specified counseling: Secondary | ICD-10-CM

## 2021-04-11 DIAGNOSIS — N179 Acute kidney failure, unspecified: Secondary | ICD-10-CM | POA: Diagnosis not present

## 2021-04-11 DIAGNOSIS — J849 Interstitial pulmonary disease, unspecified: Secondary | ICD-10-CM | POA: Diagnosis not present

## 2021-04-11 LAB — CULTURE, BLOOD (ROUTINE X 2)
Culture: NO GROWTH
Special Requests: ADEQUATE

## 2021-04-11 LAB — CBC
HCT: 27 % — ABNORMAL LOW (ref 36.0–46.0)
Hemoglobin: 8 g/dL — ABNORMAL LOW (ref 12.0–15.0)
MCH: 31.1 pg (ref 26.0–34.0)
MCHC: 29.6 g/dL — ABNORMAL LOW (ref 30.0–36.0)
MCV: 105.1 fL — ABNORMAL HIGH (ref 80.0–100.0)
Platelets: 312 10*3/uL (ref 150–400)
RBC: 2.57 MIL/uL — ABNORMAL LOW (ref 3.87–5.11)
RDW: 20 % — ABNORMAL HIGH (ref 11.5–15.5)
WBC: 13.9 10*3/uL — ABNORMAL HIGH (ref 4.0–10.5)
nRBC: 0.2 % (ref 0.0–0.2)

## 2021-04-11 LAB — COMPREHENSIVE METABOLIC PANEL
ALT: 45 U/L — ABNORMAL HIGH (ref 0–44)
AST: 63 U/L — ABNORMAL HIGH (ref 15–41)
Albumin: 2.1 g/dL — ABNORMAL LOW (ref 3.5–5.0)
Alkaline Phosphatase: 118 U/L (ref 38–126)
Anion gap: 19 — ABNORMAL HIGH (ref 5–15)
BUN: 39 mg/dL — ABNORMAL HIGH (ref 8–23)
CO2: 16 mmol/L — ABNORMAL LOW (ref 22–32)
Calcium: 8.4 mg/dL — ABNORMAL LOW (ref 8.9–10.3)
Chloride: 113 mmol/L — ABNORMAL HIGH (ref 98–111)
Creatinine, Ser: 1.75 mg/dL — ABNORMAL HIGH (ref 0.44–1.00)
GFR, Estimated: 31 mL/min — ABNORMAL LOW (ref >60)
Glucose, Bld: 103 mg/dL — ABNORMAL HIGH (ref 70–99)
Potassium: 3.8 mmol/L (ref 3.5–5.1)
Sodium: 148 mmol/L — ABNORMAL HIGH (ref 135–145)
Total Bilirubin: 1.2 mg/dL (ref 0.3–1.2)
Total Protein: 5.9 g/dL — ABNORMAL LOW (ref 6.5–8.1)

## 2021-04-11 LAB — C-REACTIVE PROTEIN: CRP: 16.8 mg/dL — ABNORMAL HIGH (ref <1.0)

## 2021-04-11 LAB — SEDIMENTATION RATE: Sed Rate: 138 mm/hr — ABNORMAL HIGH (ref 0–22)

## 2021-04-11 MED ORDER — SODIUM CHLORIDE 0.9 % IV SOLN
2.0000 g | INTRAVENOUS | Status: DC
  Administered 2021-04-12: 2 g via INTRAVENOUS
  Filled 2021-04-11: qty 2

## 2021-04-11 MED ORDER — DEXTROSE-NACL 5-0.9 % IV SOLN: INTRAVENOUS | Status: DC

## 2021-04-11 NOTE — Progress Notes (Signed)
EEG completed, results pending. 

## 2021-04-11 NOTE — Consult Note (Signed)
[place] Nurse Consult Note: Reason for Consult: Stage 3 pressure injury to sacrum (25% red, 75% yellow wound bed) Wound type:Pressure Pressure Injury POA: Yes Measurement:Bedside RN to obtain sacral PI measurement and document on Nursing flow sheet today prior to first dressing change (LxWxD in cm) Wound bed:As noted above Drainage (amount, consistency, odor) small serous to light yellow consistent with autolytically debriding slough Periwound: intact Dressing procedure/placement/frequency: I have provided Nursing with guidance for the care of this wound using a xeroform gauze wound contact layer and topping with dry gauze followed by a silicone bordered form dressing. A mattress replacement is provided as patient is often in the supine position with deep and complex wounds to the bilateral LEs.   My partner, D. Barbie Haggis was consulted on 04/07/21 regarding the patient's complex bilateral LE wounds. She documented in her note on that date that they are outside the scope of practice for [place] nursing due to their duration and severity.  [place] nursing team will not follow, but will remain available to this patient, the nursing and medical teams.  Please re-consult if needed. Thanks, Maudie Flakes, MSN, RN, [place], Arther Abbott  Pager# [phone]

## 2021-04-11 NOTE — Progress Notes (Signed)
PROGRESS NOTE    [name] [name]  FGH:[id number] DOB: 1951/03/31 DOA: 04/28/2021 PCP: [name], MD    Brief Narrative:  70 y.o. 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   Assessment & Plan:   Principal Problem:   Cellulitis, leg Active Problems:   Peripheral arterial occlusive disease (HCC)   Pulmonary hypertension associated with systemic disorder (HCC)   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HCC)   Scleroderma (HCC)   Chronic respiratory failure with hypoxia (HCC)   ILD (interstitial lung disease) (HCC)   AKI (acute kidney injury) ([place])   PAF (paroxysmal atrial fibrillation) (HCC)   Wound cellulitis   SIRS (systemic inflammatory response syndrome) (HCC)   Pseudomonas aeruginosa infection   Protein-calorie malnutrition, severe  Chronic wounds of bilateral lower extremity with pseudomonas bacteremia pt presented with WBC of 22.8, lactate of 2.4 Previously had Enterococcus faecalis and Pseudomonas on wound culture Pt was initially started on IV vancomycin and cefepime Patient has failed treatment with wound care, plastic surgery and has already been optimized for her PAD.   Chart reviewed. Numerous specialists have recommended orthopedic referral for amputation, however patient had previously expressed wish against amputations Blood cultures are pos for Pseudomonas, ID following with recs for continued cefepime Appreciate Palliative Care assistance. Remains full code, full scope for now  Likely osteomyelitis of the left 4 and  5th digit MRI foot reviewed. Findings notable for abnormal edema distally in the prox phalanx of the small toe suspicious for active osteomyelitis.  -Had earlier discussed with ID who is familiar with pt. Rec to hold off on surgical consult and to continue with wound care as per [facility] specialists  Lactic acidosis Noted to have trended down with IVF, was 1.8 on most recent check  AKI on CKD stage 3 Renal function now worsening, likely secondary to dehydration related to poor PO intake Have discontinued diuretic tx, continue on gentle IVF Recheck bmet in AM  Anemia of chronic disease/iron deficiency hx of GI bleed nursing noted dark stool although pt also on iron supplementation Hgb is trending up. Family and staff reports no evidence of acute blood loss currently  Hyponatremia, hypernatremia Trending up, how hypernatremic Suspect secondary to dehydration. Have continued on IVF hydration Recheck bmet in AM  Interstitial lung disease with chronic hypoxemia Continue baseline O2 of 2 to 3 L Follows with outpatient pulmonology  Pulmonary hypertension continue on Revatio and macitentan Had been continued on torsemide prior to admit, now on hold given concerns of dehydration  Scleroderma Had been on chronic prednisone, currently on hold given limited PO intake Cont on IV steroid for now until pt can tolerate PO reliably  PAD Had been continued with Plavix as tolerated  Paroxysmal atrial fibrillation Not on anticoagulation due to previous GI bleed  History of DVT Has IVC filter in place Seems to be stable at this time  Hyperlipidemia Off statin due to myalgia  Acute toxic metabolic encephalopathy -Suspect secondary to above dehydration, IVF increased to 100cc/hr -Have ordered STAT head CT, f/u on results -Pt appears to have gaze on exam, have  ordered EEG  Hyopkalemia -replaced -Will repeat bmet in AM  DVT prophylaxis: Heparin subq Code Status: Full Family  Communication: Pt in room, family remains at bedside  Status is: Inpatient  Remains inpatient appropriate because:IV treatments appropriate due to intensity of illness or inability to take PO and Inpatient level of care appropriate due to severity of illness   Dispo: The patient is from: Home              Anticipated d/c is to: Home              Patient currently is not medically stable to d/c.   Difficult to place patient No   Consultants:   ID  Palliative Care  Procedures:     Antimicrobials: Anti-infectives (From admission, onward)   Start     Dose/Rate Route Frequency Ordered Stop   04/12/21 0600  ceFEPIme (MAXIPIME) 2 g in sodium chloride 0.9 % 100 mL IVPB        2 g 200 mL/hr over 30 Minutes Intravenous Every 24 hours 04/11/21 0712     04/08/21 0600  vancomycin (VANCOREADY) IVPB 1000 mg/200 mL  Status:  Discontinued        1,000 mg 200 mL/hr over 60 Minutes Intravenous Every 36 hours 04/07/21 0059 04/08/21 0828   04/07/21 0600  ceFEPIme (MAXIPIME) 2 g in sodium chloride 0.9 % 100 mL IVPB  Status:  Discontinued        2 g 200 mL/hr over 30 Minutes Intravenous Every 12 hours 04/07/21 0059 04/11/21 0712   04/24/2021 1715  vancomycin (VANCOCIN) IVPB 1000 mg/200 mL premix        1,000 mg 200 mL/hr over 60 Minutes Intravenous  Once 04/25/2021 1711 04/18/2021 2045   04/21/2021 1715  ceFEPIme (MAXIPIME) 2 g in sodium chloride 0.9 % 100 mL IVPB        2 g 200 mL/hr over 30 Minutes Intravenous  Once 04/28/2021 1711 04/14/2021 1800      Subjective: Difficult to assess given mentation  Objective: Vitals:   04/10/21 1850 04/10/21 2129 04/11/21 0544 04/11/21 1351  BP:  (!) 157/87 (!) 131/55 (!) 158/80  Pulse:  93 79 85  Resp:  18 20   Temp:  98.9 F (37.2 C) 98 F (36.7 C) 97.8 F (36.6 C)  TempSrc:  Oral Oral Oral  SpO2: 100% 94% (!) 75%   Weight:      Height:        Intake/Output Summary (Last 24 hours) at 04/11/2021 1618 Last data filed at 04/11/2021 1355 Gross per 24  hour  Intake 805 ml  Output 1 ml  Net 804 ml   Filed Weights   04/10/2021 1502 04/15/2021 2300  Weight: 54 kg 53.9 kg    Examination: General exam: lethargic but arousable, in no acute distress Respiratory system: normal chest rise, clear, no audible wheezing Cardiovascular system: regular rhythm, s1-s2 Gastrointestinal system: Nondistended, nontender, pos BS Central nervous system: No seizures, no tremors Extremities: No cyanosis, no joint deformities Skin: No rashes, no pallor Psychiatry: unable to assess given current mentation   Data Reviewed: I have personally reviewed following labs and imaging studies  CBC: Recent Labs  Lab 04/15/2021 1539 04/07/21 0006 04/08/21 0523 04/08/21 1234 04/09/21 0615 04/10/21 0648 04/11/21 0525  WBC 22.8*   < > 15.8* 16.7* 17.7* 15.1* 13.9*  NEUTROABS 20.8*  --   --   --   --   --   --   HGB 8.0*   < >  7.0* 7.2* 8.2* 7.8* 8.0*  HCT 25.3*   < > 22.6* 23.3* 26.3* 25.7* 27.0*  MCV 96.9   < > 97.8 98.3 96.7 100.0 105.1*  PLT 364   < > 408* 341 408* 334 312   < > = values in this interval not displayed.   Basic Metabolic Panel: Recent Labs  Lab 04/21/2021 1623 04/07/21 0006 04/08/21 0523 04/09/21 0615 04/09/21 2202 04/10/21 0648 04/11/21 0525  NA  --    < > 134* 135 142 140 148*  K  --    < > 4.3 3.9 4.1 3.4* 3.8  CL  --    < > 98 99 101 104 113*  CO2  --    < > 21* 17* 16* 14* 16*  GLUCOSE  --    < > 76 72 75 77 103*  BUN  --    < > 45* 46* 44* 45* 39*  CREATININE  --    < > 1.24* 1.14* 1.44* 1.50* 1.75*  CALCIUM  --    < > 8.3* 8.4* 8.8* 8.4* 8.4*  MG 2.0  --   --   --   --   --   --    < > = values in this interval not displayed.   GFR: Estimated Creatinine Clearance: 25.1 mL/min (A) (by C-G formula based on SCr of 1.75 mg/dL (H)). Liver Function Tests: Recent Labs  Lab 04/08/21 0523 04/09/21 0615 04/09/21 2202 04/10/21 0648 04/11/21 0525  AST 92* 97* 85* 67* 63*  ALT 52* 60* 55* 52* 45*  ALKPHOS 113 153* 147* 135* 118   BILITOT 0.4 0.8 1.4* 1.6* 1.2  PROT 5.7* 5.8* 6.3* 5.9* 5.9*  ALBUMIN 2.1* 2.2* 2.3* 2.2* 2.1*   No results for input(s): LIPASE, AMYLASE in the last 168 hours. Recent Labs  Lab 04/09/21 2202  AMMONIA 24   Coagulation Profile: Recent Labs  Lab 04/19/2021 1539  INR 0.9   Cardiac Enzymes: No results for input(s): CKTOTAL, CKMB, CKMBINDEX, TROPONINI in the last 168 hours. BNP (last 3 results) Recent Labs    11/21/20 1439  PROBNP 526.0*   HbA1C: No results for input(s): HGBA1C in the last 72 hours. CBG: No results for input(s): GLUCAP in the last 168 hours. Lipid Profile: No results for input(s): CHOL, HDL, LDLCALC, TRIG, CHOLHDL, LDLDIRECT in the last 72 hours. Thyroid Function Tests: No results for input(s): TSH, T4TOTAL, FREET4, T3FREE, THYROIDAB in the last 72 hours. Anemia Panel: No results for input(s): VITAMINB12, FOLATE, FERRITIN, TIBC, IRON, RETICCTPCT in the last 72 hours. Sepsis Labs: Recent Labs  Lab 04/27/2021 2341 04/07/21 0204 04/07/21 0553 04/09/21 2202  LATICACIDVEN 2.4* 2.4* 1.8 1.8    Recent Results (from the past 240 hour(s))  Blood Culture (routine x 2)     Status: Abnormal   Collection Time: 04/15/2021  4:23 PM   Specimen: BLOOD  Result Value Ref Range Status   Specimen Description   Final    BLOOD RIGHT ANTECUBITAL Performed at [facility], [place] [address], [place], [place] [address]    Special Requests   Final    BOTTLES DRAWN AEROBIC AND ANAEROBIC Blood Culture adequate volume Performed at [facility], [address] [place], [place] [address]    Culture  Setup Time   Final    GRAM NEGATIVE RODS AEROBIC BOTTLE ONLY Organism ID to follow CRITICAL RESULT CALLED TO, READ BACK BY AND VERIFIED WITHSeleta Rhymes [facility] 4268 04/07/21 A BROWNING Performed  at [facility], [place] [address] [place], [place] [address]    Culture PSEUDOMONAS AERUGINOSA (A)  Final   Report Status 04/10/2021 FINAL  Final   Organism ID, Bacteria  PSEUDOMONAS AERUGINOSA  Final      Susceptibility   Pseudomonas aeruginosa - MIC*    CEFTAZIDIME 4 SENSITIVE Sensitive     CIPROFLOXACIN <=0.25 SENSITIVE Sensitive     GENTAMICIN <=1 SENSITIVE Sensitive     IMIPENEM 2 SENSITIVE Sensitive     PIP/TAZO <=4 SENSITIVE Sensitive     CEFEPIME 2 SENSITIVE Sensitive     * PSEUDOMONAS AERUGINOSA  Blood Culture (routine x 2)     Status: None   Collection Time: 04/04/2021  4:23 PM   Specimen: BLOOD  Result Value Ref Range Status   Specimen Description   Final    BLOOD LEFT ANTECUBITAL Performed at [facility], [place] [address] [place], [place] [address]    Special Requests   Final    BOTTLES DRAWN AEROBIC AND ANAEROBIC Blood Culture adequate volume Performed at [facility], [place] [place], [place] [address]    Culture   Final    NO GROWTH 5 DAYS Performed at [facility], [place] [address], [place], [place] [address]    Report Status 04/11/2021 FINAL  Final  Blood Culture ID Panel (Reflexed)     Status: Abnormal   Collection Time: 04/25/2021  4:23 PM  Result Value Ref Range Status   Enterococcus faecalis NOT DETECTED NOT DETECTED Final   Enterococcus Faecium NOT DETECTED NOT DETECTED Final   Listeria monocytogenes NOT DETECTED NOT DETECTED Final   Staphylococcus species NOT DETECTED NOT DETECTED Final   Staphylococcus aureus (BCID) NOT DETECTED NOT DETECTED Final   Staphylococcus epidermidis NOT DETECTED NOT DETECTED Final   Staphylococcus lugdunensis NOT DETECTED NOT DETECTED Final   Streptococcus species NOT DETECTED NOT DETECTED Final   Streptococcus agalactiae NOT DETECTED NOT DETECTED Final   Streptococcus pneumoniae NOT DETECTED NOT DETECTED Final   Streptococcus pyogenes NOT DETECTED NOT DETECTED Final   A.calcoaceticus-baumannii NOT DETECTED NOT DETECTED Final   Bacteroides fragilis NOT DETECTED NOT DETECTED Final   Enterobacterales NOT DETECTED NOT DETECTED Final   Enterobacter cloacae complex NOT  DETECTED NOT DETECTED Final   Escherichia coli NOT DETECTED NOT DETECTED Final   Klebsiella aerogenes NOT DETECTED NOT DETECTED Final   Klebsiella oxytoca NOT DETECTED NOT DETECTED Final   Klebsiella pneumoniae NOT DETECTED NOT DETECTED Final   Proteus species NOT DETECTED NOT DETECTED Final   Salmonella species NOT DETECTED NOT DETECTED Final   Serratia marcescens NOT DETECTED NOT DETECTED Final   Haemophilus influenzae NOT DETECTED NOT DETECTED Final   Neisseria meningitidis NOT DETECTED NOT DETECTED Final   Pseudomonas aeruginosa DETECTED (A) NOT DETECTED Final    Comment: CRITICAL RESULT CALLED TO, READ BACK BY AND VERIFIED WITH: Seleta Rhymes PHARMD 2327 04/07/21 A BROWNING    Stenotrophomonas maltophilia NOT DETECTED NOT DETECTED Final   Candida albicans NOT DETECTED NOT DETECTED Final   Candida auris NOT DETECTED NOT DETECTED Final   Candida glabrata NOT DETECTED NOT DETECTED Final   Candida krusei NOT DETECTED NOT DETECTED Final   Candida parapsilosis NOT DETECTED NOT DETECTED Final   Candida tropicalis NOT DETECTED NOT DETECTED Final   Cryptococcus neoformans/gattii NOT DETECTED NOT DETECTED Final   CTX-M ESBL NOT DETECTED NOT DETECTED Final   Carbapenem resistance IMP NOT DETECTED NOT DETECTED Final   Carbapenem resistance  KPC NOT DETECTED NOT DETECTED Final   Carbapenem resistance NDM NOT DETECTED NOT DETECTED Final   Carbapenem resistance VIM NOT DETECTED NOT DETECTED Final    Comment: Performed at [facility], [place] [address] [place], [place] [address]  Urine culture     Status: None   Collection Time: 04/12/2021  4:26 PM   Specimen: Urine, Random  Result Value Ref Range Status   Specimen Description   Final    URINE, RANDOM Performed at [facility], [place] [place], [place] [address]    Special Requests   Final    NONE Performed at [facility], [address] [place], [place] [address]    Culture   Final    NO GROWTH Performed at  [facility], [place] [address] [place], [place] [address]    Report Status 04/08/2021 FINAL  Final  Resp Panel by RT-PCR (Flu A&B, Covid) Nasopharyngeal Swab     Status: None   Collection Time: 04/22/2021  8:00 PM   Specimen: Nasopharyngeal Swab; Nasopharyngeal(NP) swabs in vial transport medium  Result Value Ref Range Status   SARS Coronavirus 2 by RT PCR NEGATIVE NEGATIVE Final    Comment: (NOTE) SARS-CoV-2 target nucleic acids are NOT DETECTED.  The SARS-CoV-2 RNA is generally detectable in upper respiratory specimens during the acute phase of infection. The lowest concentration of SARS-CoV-2 viral copies this assay can detect is 138 copies/mL. A negative result does not preclude SARS-Cov-2 infection and should not be used as the sole basis for treatment or other patient management decisions. A negative result may occur with  improper specimen collection/handling, submission of specimen other than nasopharyngeal swab, presence of viral mutation(s) within the areas targeted by this assay, and inadequate number of viral copies(<138 copies/mL). A negative result must be combined with clinical observations, patient history, and epidemiological information. The expected result is Negative.  Fact Sheet for Patients:  EntrepreneurPulse.com.au  Fact Sheet for Healthcare Providers:  IncredibleEmployment.be  This test is no t yet approved or cleared by the [place] FDA and  has been authorized for detection and/or diagnosis of SARS-CoV-2 by FDA under an Emergency Use Authorization (EUA). This EUA will remain  in effect (meaning this test can be used) for the duration of the COVID-19 declaration under Section 564(b)(1) of the Act, 21 U.S.C.section 360bbb-3(b)(1), unless the authorization is terminated  or revoked sooner.       Influenza A by PCR NEGATIVE NEGATIVE Final   Influenza B by PCR NEGATIVE NEGATIVE Final    Comment: (NOTE) The  Xpert Xpress SARS-CoV-2/FLU/RSV plus assay is intended as an aid in the diagnosis of influenza from Nasopharyngeal swab specimens and should not be used as a sole basis for treatment. Nasal washings and aspirates are unacceptable for Xpert Xpress SARS-CoV-2/FLU/RSV testing.  Fact Sheet for Patients: EntrepreneurPulse.com.au  Fact Sheet for Healthcare Providers: IncredibleEmployment.be  This test is not yet approved or cleared by the [place] FDA and has been authorized for detection and/or diagnosis of SARS-CoV-2 by FDA under an Emergency Use Authorization (EUA). This EUA will remain in effect (meaning this test can be used) for the duration of the COVID-19 declaration under Section 564(b)(1) of the Act, 21 U.S.C. section 360bbb-3(b)(1), unless the authorization is terminated or revoked.  Performed at [facility], [address] [place], [place] [address]   Culture, blood (routine x 2)     Status: None (Preliminary result)  Collection Time: 04/08/21  8:49 AM   Specimen: BLOOD  Result Value Ref Range Status   Specimen Description   Final    BLOOD RIGHT ANTECUBITAL Performed at [place] [address], [place], [place] [address]    Special Requests   Final    BOTTLES DRAWN AEROBIC AND ANAEROBIC Blood Culture results may not be optimal due to an inadequate volume of blood received in culture bottles Performed at [place] [address] [place], [place] [address]    Culture   Final    NO GROWTH 3 DAYS Performed at [facility], [place] [address] [place], [place] [address]    Report Status PENDING  Incomplete  Culture, blood (routine x 2)     Status: None (Preliminary result)   Collection Time: 04/08/21  8:49 AM   Specimen: BLOOD RIGHT HAND  Result Value Ref Range Status   Specimen Description   Final    BLOOD RIGHT HAND Performed at [place] [address] [place], [place] [address]    Special Requests   Final    BOTTLES DRAWN AEROBIC AND ANAEROBIC Blood Culture adequate volume Performed at [place] [address], [place], [place] [address]    Culture   Final    NO GROWTH 3 DAYS Performed at [facility], [place] [address], [place], [place] [address]    Report Status PENDING  Incomplete     Radiology Studies: No results found.  Scheduled Meds: . citalopram  20 mg Oral QHS  . clopidogrel  75 mg Oral Daily  . cycloSPORINE  2 drop Both Eyes BID  . feeding supplement  237 mL Oral BID BM  . folic acid  1 mg Oral Daily  . heparin  5,000 Units Subcutaneous Q8H  . hydrocortisone sod succinate (SOLU-CORTEF) inj  50 mg Intravenous Daily  . levothyroxine  50 mcg Oral Q0600  . macitentan  10 mg Oral Daily  . magnesium oxide  200 mg Oral BID  . multivitamin with minerals  1 tablet Oral Daily  . nutrition supplement (JUVEN)  1 packet Oral BID WC  . pantoprazole (PROTONIX) IV  40 mg Intravenous Q24H  . vitamin B-12  1,000 mcg Oral Daily   Continuous Infusions: . sodium chloride 50 mL/hr at 04/09/21 0510  . sodium chloride 75 mL/hr at 04/10/21 2029  . [START ON 04/12/2021] ceFEPime (MAXIPIME) IV    . dextrose 5 % and 0.9% NaCl 100 mL/hr at 04/11/21 1124     LOS: 5 days   Marylu Lund, MD [facility] Pager On Amion  If 7PM-7AM, please contact night-coverage 04/11/2021, 4:18 PM

## 2021-04-11 NOTE — Procedures (Signed)
ROUTINE EEG REPORT REFERRING PHYSICIAN:  Marylu Lund MD DATE OF STUDY:  04/11/2021  HISTORY: 70yo woman with history of scleroderma, NHL with cerebral involvement s/p chemotherapy in remission, chronic leg wounds with confusion, evaluate for seizure as etiology.  DESCRIPTION: Diffuse EMG artifact obscured the majority of the recording.  A slow but continuous background could be seen in the posterior leads with an admixture of high amplitude delta>theta activity between 2-6 hz.  Rhythmic sharply contoured delta at 2 Hz was seen in the posterior leads throughout much of the recording at times with a triphasic morphology- favored to be generalized but most of the anterior leads on the recording were obscured by artifact. No sleep structures were seen.  CLASSIFICATION (EEG IMPRESSION): Abnormal Significance III (stuporous) 1. Artifact obscured EEG 2. Technically difficult 3. Continuous slow, generalized 4. Intermittent Rhythmic Slow, Generalized with Sharp activity (GRDA+S)  CLINICAL INTERPRETATION: 1) The study was extremely limited by diffuse artifact.  Subtle abnormalities will be missed. 2) There is evidence of a diffuse severe encephalopathy with cortical irritability.  Neurology consultation and repeat EEG are recommended.  Jhamal Plucinski A. Marvel Plan, MD Neurology and Clinical Neurophysiology

## 2021-04-11 NOTE — Progress Notes (Signed)
Pharmacy Antibiotic Note  [name] [name] is a 70 y.o. female with hx scleroderma and chronic leg wounds with PsA colonization presented to the ED on 04/02/2021 with AMS.  Blood culture collected on 4/7 has PsA and left foot MRI showed findings with concern for osteomyelitis.  She's currently on cefepime for infection.  Today, 04/11/2021: - day #5 abx -  Afeb, wbc elevated but trending down - scr continues to trend up 1.75 (crcl~25) despite torsemide d/ced and NS _0  ml/hr started yesterday  Plan: - Adjust cefepime dose to 2gm IV q24h - monitor renal function closely - f/u cultures  ___________________________________________  Height: 5' 7.5" (171.5 cm) Weight: 53.9 kg (118 lb 13.3 oz) IBW/kg (Calculated) : 62.75  Temp (24hrs), Avg:98.2 F (36.8 C), Min:97.7 F (36.5 C), Max:98.9 F (37.2 C)  Recent Labs  Lab 04/20/2021 1623 04/09/2021 2341 04/07/21 0006 04/07/21 0204 04/07/21 0553 04/08/21 0523 04/08/21 1234 04/09/21 0615 04/09/21 2202 04/10/21 0648 04/11/21 0525  WBC  --   --    < >  --   --  15.8* 16.7* 17.7*  --  15.1* 13.9*  CREATININE  --   --    < >  --   --  1.24*  --  1.14* 1.44* 1.50* 1.75*  LATICACIDVEN 1.1 2.4*  --  2.4* 1.8  --   --   --  1.8  --   --    < > = values in this interval not displayed.    Estimated Creatinine Clearance: 25.1 mL/min (A) (by C-G formula based on SCr of 1.75 mg/dL (H)).    Allergies  Allergen Reactions  . Levofloxacin Other (See Comments)    FLU LIKE SYMPTOM   . Sulfa Antibiotics Rash and Other (See Comments)    Face peeled, Severe rash with significant peeling of face. No airway involvement per patient   . Doxycycline Hyclate Swelling    SWELLING REACTION UNSPECIFIED ; "TABLETS ONLY - CAPSULES ARE TOLERATED FINE"  . Penicillins Rash    Has patient had a PCN reaction causing immediate rash, facial/tongue/throat swelling, SOB or lightheadedness with hypotension: no Has patient had a PCN reaction causing severe rash involving  mucus membranes or skin necrosis: no Has patient had a PCN reaction that required hospitalization: no Has patient had a PCN reaction occurring within the last 10 years: {no If all of the above answers are "NO", then may proceed with Cephalosporin use.   Antimicrobials this admission: 4/7 vanc >>4/9 4/7 cefepime >>   Microbiology results: 4/7 BCx x2: 1/4 GNR, PsA 4/7 UCx: ngF 4/9 BCx2:  4/7 salmonella: neg    Thank you for allowing pharmacy to be a part of this patient's care.  Lynelle Doctor 04/11/2021 7:17 AM

## 2021-04-11 NOTE — Consult Note (Signed)
Consultation Note Date: 04/11/2021   Patient Name: [name] [name]  DOB: [birth year]  MRN: [id number]  Age / Sex: 70 y.o., female  PCP: [name], MD Referring Physician: Donne Hazel, MD  Reason for Consultation: Establishing goals of care  HPI/Patient Profile: 70 y.o. 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 It is noted that patient reportedly declined amputation in the past. Remains on antibiotics. Palliative consult for goals of care discussions has been requested.   Patient has received pain medication, she is resting in bed, appears weak and cachectic, doesn't verbalize much. I met with her husband who was at bedside. I introduced myself and palliative care as follows:  Palliative medicine is specialized medical care for people living with serious illness. It focuses on providing relief from the symptoms and stress of a serious illness. The goal is to improve quality of life for both the patient and the family.  Goals of care: Broad aims of medical therapy in relation to the patient's values and preferences. Our aim is to provide medical care aimed at enabling patients to achieve the goals  that matter most to them, given the circumstances of their particular medical situation and their constraints.   We discussed about scope of current hospitalization and the serious nature of the patient's current illness. Differences between hospice and palliative care were brought up and explored. Brief life review also performed, see below.   NEXT OF KIN  lives at home with husband, they have a grand daughter.   SUMMARY OF RECOMMENDATIONS    Full Code, Full Scope care for now. Continue with current hospitalization, follow recommendations from ID, remains admitted on hospital medicine service.  Home with palliative services when patient is stable enough to be d/c home discussed with husband at bedside today. Also, briefly touched upon hospice philosophy of care. All of his concerns addressed to the best of my ability.  Thank you for the consult.   Code Status/Advance Care Planning:  Full code    Symptom Management:    has IV Dilaudid PRN.   Palliative Prophylaxis:   Delirium Protocol  Additional Recommendations (Limitations, Scope, Preferences):  Full Scope Treatment  Psycho-social/Spiritual:   Desire for further Chaplaincy support:yes  Additional Recommendations: Caregiving  Support/Resources  Prognosis:   Unable to determine  Discharge Planning: To Be Determined      Primary Diagnoses: Present on Admission: . Cellulitis, leg .  Wound cellulitis . Peripheral arterial occlusive disease ([place]) . Pulmonary hypertension associated with systemic disorder ([place]) . 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[place]) . Scleroderma ([place]) . ILD (interstitial lung disease) ([place]) . AKI (acute kidney injury) ([place]) . Chronic respiratory failure with hypoxia ([place]) . PAF (paroxysmal atrial fibrillation) ([place])   I have reviewed the medical record, interviewed the patient and family, and examined the patient. The following aspects are pertinent.  Past Medical History:  Diagnosis Date  . Anemia    . Arthritis   . CHF (congestive heart failure) ([place])   . DOE (dyspnea on exertion)   . Epistaxis   . History of chicken pox   . Hypertension   . Hypothyroidism   . Leg ulcer ([place])   . Melanoma ([place]) 1999  . 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[place])   . Non Hodgkin's lymphoma ([place])    Treated with chemo 2010, felt to be cured.  Marland Kitchen PAH (pulmonary artery hypertension) ([place])   . Peripheral arterial occlusive disease ([place])   . Pneumonia 2008  . Pulmonary hypertension ([place])   . Requires supplemental oxygen    2 liters-3liters when moving  . 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 Marital status: Married    Spouse name: Not on file  . Number of children: 1  . Years of education: Not on file  . Highest education level: Not on file  Occupational History  . Occupation: homemaker  Tobacco Use  . Smoking status: Never Smoker  . Smokeless tobacco: Never Used  Vaping Use  . Vaping Use: Never used  Substance and Sexual Activity  . Alcohol use: Yes    Alcohol/week: 7.0 standard drinks    Types: 4 Glasses of wine, 3 Standard drinks or equivalent per week    Comment: weekly  . Drug use: No  . Sexual activity: Not on file  Other Topics Concern  . Not on file  Social History Narrative  . Not on file   Social Determinants of Health   Financial Resource Strain: Not on file  Food Insecurity: Not on file  Transportation Needs: Not on file  Physical Activity: Not on file  Stress: Not on file  Social Connections: Not on file   Family History  Problem Relation Age of Onset  . Lupus Father   . Lymphoma Father   . Hyperlipidemia Mother   . Cancer Daughter        sarcoma  . Colon cancer Other    Scheduled Meds: . citalopram  20 mg Oral QHS  . clopidogrel  75 mg Oral Daily  . cycloSPORINE  2 drop Both Eyes BID  . feeding supplement  237 mL Oral BID BM  . folic acid  1 mg Oral Daily  . heparin  5,000 Units Subcutaneous Q8H  . hydrocortisone sod  succinate (SOLU-CORTEF) inj  50 mg Intravenous Daily  . levothyroxine  50 mcg Oral Q0600  . macitentan  10 mg Oral Daily  . magnesium oxide  200 mg Oral BID  . multivitamin with minerals  1 tablet Oral Daily  . nutrition supplement (JUVEN)  1 packet Oral BID WC  . pantoprazole (PROTONIX) IV  40 mg Intravenous Q24H  . vitamin B-12  1,000 mcg Oral Daily   Continuous Infusions: . sodium chloride 50 mL/hr at 04/09/21 0510  . sodium chloride 75 mL/hr at 04/10/21 2029  . [START ON 04/12/2021] ceFEPime (MAXIPIME) IV    . dextrose 5 % and 0.9% NaCl 100  mL/hr at 04/11/21 1124   PRN Meds:.sodium chloride, acetaminophen, HYDROmorphone (DILAUDID) injection, oxyCODONE-acetaminophen Medications Prior to Admission:  Prior to Admission medications   Medication Sig Start Date End Date Taking? Authorizing Provider  acetaminophen (TYLENOL) 650 MG CR tablet Take 1,300 mg by mouth every 8 (eight) hours as needed for pain.   Yes [provider]  Biotin 5000 MCG CAPS Take 5,000 mcg by mouth daily.   Yes [provider]  Calcium Carbonate-Vit D-Min (CALCIUM 1200 PO) Take 1 tablet by mouth at bedtime.   Yes [provider]  Cholecalciferol (VITAMIN D) 2000 units CAPS Take 2,000 Units by mouth at bedtime.   Yes [provider]  citalopram (CELEXA) 20 MG tablet Take 1 tablet (20 mg total) by mouth at bedtime. 02/10/21  Yes Regalado, Belkys A, MD  clopidogrel (PLAVIX) 75 MG tablet TAKE 1 TABLET BY MOUTH EVERY DAY Patient taking differently: Take 75 mg by mouth daily. 01/05/21  Yes Bensimhon, Shaune Pascal, MD  cycloSPORINE (RESTASIS) 0.05 % ophthalmic emulsion Place 2 drops into both eyes 2 (two) times daily.   Yes [provider]  diltiazem (TIAZAC) 180 MG 24 hr capsule Take 180 mg by mouth at bedtime. 02/11/21  Yes [provider]  Ensure Max Protein (ENSURE MAX PROTEIN) LIQD Take 330 mLs (11 oz total) by mouth at bedtime. 02/10/21  Yes Regalado, Belkys A, MD  FERREX 150  150 MG capsule TAKE 1 CAPSULE BY MOUTH TWICE A DAY Patient taking differently: Take 150 mg by mouth 2 (two) times daily. 01/05/21  Yes Baxley, Cresenciano Lick, MD  fish oil-omega-3 fatty acids 1000 MG capsule Take 1,000 mg by mouth 2 (two) times daily.   Yes [provider]  folic acid (FOLVITE) 1 MG tablet TAKE 1 TABLET BY MOUTH EVERY DAY Patient taking differently: Take 1 mg by mouth daily. 01/05/21  Yes Baxley, Cresenciano Lick, MD  gabapentin (NEURONTIN) 100 MG capsule Take 100 mg by mouth every morning. 03/01/21  Yes [provider]  gabapentin (NEURONTIN) 300 MG capsule Take 300 mg by mouth at bedtime. 03/24/21  Yes [provider]  levothyroxine (SYNTHROID) 50 MCG tablet TAKE 1 TABLET BY MOUTH DAILY BEFORE BREAKFAST Patient taking differently: Take 50 mcg by mouth daily before breakfast. 03/05/21  Yes Baxley, Cresenciano Lick, MD  MAGNESIUM ASPARTATE PO Take 150 mg by mouth in the morning and at bedtime.   Yes [provider]  Multiple Vitamin (MULTIVITAMIN WITH MINERALS) TABS tablet Take 1 tablet by mouth daily at 12 noon.   Yes [provider]  nutrition supplement, JUVEN, (JUVEN) PACK Take 1 packet by mouth 2 (two) times daily between meals. 02/10/21  Yes Regalado, Belkys A, MD  ondansetron (ZOFRAN) 4 MG tablet TAKE 1 TABLET BY MOUTH EVERY 8 HOURS AS NEEDED FOR NAUSEA AND VOMITING Patient taking differently: Take 4 mg by mouth every 8 (eight) hours as needed for nausea or vomiting. 03/05/21  Yes Baxley, Cresenciano Lick, MD  OPSUMIT 10 MG tablet Take 1 tablet (22m) by mouth daily. 04/29/20  Yes Bensimhon, DShaune Pascal MD  OXYGEN Inhale 2-3 L into the lungs continuous.    Yes [provider]  pantoprazole (PROTONIX) 40 MG tablet Take 1 tablet (40 mg total) by mouth 2 (two) times daily. 10/05/20  Yes Baxley, MCresenciano Lick MD  potassium chloride 20 MEQ/15ML (10%) SOLN TAKE 2 TEASPOONFUL (DILUTED IN WATER) ONCE DAILY Patient taking differently: Take 13.3 mEq by mouth See admin instructions. Dilute  in water once daily  08/02/20  Yes Baxley, Cresenciano Lick, MD  predniSONE (DELTASONE) 5 MG tablet Take 5 mg by mouth daily. 04/04/20  Yes [provider]  rosuvastatin (CRESTOR) 5 MG tablet Take 1 tablet every Monday, Wednesday, and Friday. Patient taking differently: Take 5 mg by mouth every Monday, Wednesday, and Friday. Take 1 tablet every Monday, Wednesday, and Friday. 08/29/20  Yes Bensimhon, Shaune Pascal, MD  spironolactone (ALDACTONE) 25 MG tablet Take 25 mg by mouth daily. 03/14/21  Yes [provider]  torsemide (DEMADEX) 20 MG tablet TAKE 2 TABLETS BY MOUTH IN THE AM AND 1 TABLET BY MOUTH IN THE PM. Patient taking differently: Take 20-40 mg by mouth See admin instructions. Take 2 tablets by mouth every morning, and 1 tablet every evening 03/07/21  Yes Bensimhon, Shaune Pascal, MD  triamcinolone ointment (KENALOG) 0.1 % Apply 1 application topically daily as needed. Each dressing change 04/02/21  Yes [provider]  valACYclovir (VALTREX) 500 MG tablet Take 500 mg by mouth as needed (cold sore).   Yes [provider]  vitamin B-12 (CYANOCOBALAMIN) 1000 MCG tablet Take 1,000 mcg by mouth daily.   Yes [provider]  HYDROcodone-acetaminophen (NORCO) 5-325 MG tablet Take 1 tablet by mouth as needed for severe pain. (Do not exceed 1 tablet every 8 hours) Patient not taking: No sig reported 02/14/21   Phoebe Sharps C, PA-C  loperamide (IMODIUM) 2 MG capsule Take 2-4 mg by mouth 4 (four) times daily as needed for diarrhea or loose stools.    [provider]  sildenafil (REVATIO) 20 MG tablet Take 3 tablets (60 mg total) by mouth 3 (three) times daily. Patient not taking: No sig reported 02/02/21   Bensimhon, Shaune Pascal, MD   Allergies  Allergen Reactions  . Levofloxacin Other (See Comments)    FLU LIKE SYMPTOM   . Sulfa Antibiotics Rash and Other (See Comments)    Face peeled, Severe rash with significant peeling of face. No airway involvement per patient   .  Doxycycline Hyclate Swelling    SWELLING REACTION UNSPECIFIED ; "TABLETS ONLY - CAPSULES ARE TOLERATED FINE"  . Penicillins Rash    Has patient had a PCN reaction causing immediate rash, facial/tongue/throat swelling, SOB or lightheadedness with hypotension: no Has patient had a PCN reaction causing severe rash involving mucus membranes or skin necrosis: no Has patient had a PCN reaction that required hospitalization: no Has patient had a PCN reaction occurring within the last 10 years: {no If all of the above answers are "NO", then may proceed with Cephalosporin use.   Review of Systems Doesn't verbalize with me.    Physical Exam Resting in bed, eyes closed, doesn't verbalize No distress currently Regular work of breathing Appears cachectic Pictures of wounds from media section on the chart have been viewed.   Vital Signs: BP (!) 131/55 (BP Location: Right Arm)   Pulse 79   Temp 98 F (36.7 C) (Oral)   Resp 20   Ht 5' 7.5" (1.715 m)   Wt 53.9 kg   SpO2 (!) 75%   BMI 18.34 kg/m  Pain Scale: Faces POSS *See Group Information*: 1-Acceptable,Awake and alert Pain Score: Asleep   SpO2: SpO2: (!) 75 % O2 Device:SpO2: (!) 75 % O2 Flow Rate: .O2 Flow Rate (L/min): 1 L/min  IO: Intake/output summary:   Intake/Output Summary (Last 24 hours) at 04/11/2021 1315 Last data filed at 04/11/2021 0600 Gross per 24 hour  Intake 805 ml  Output 1 ml  Net 804 ml  LBM: Last BM Date: 04/16/2021 (per chart pt unable to advise) Baseline Weight: Weight: 54 kg Most recent weight: Weight: 53.9 kg     Palliative Assessment/Data:   PPS 40%  Time In:  12 Time Out:  1300 Time Total:   60 min.  Greater than 50%  of this time was spent counseling and coordinating care related to the above assessment and plan.  Signed by: Loistine Chance, MD   Please contact Palliative Medicine Team phone at [phone] for questions and concerns.  For individual provider: See Shea Evans

## 2021-04-12 ENCOUNTER — Inpatient Hospital Stay (HOSPITAL_COMMUNITY): Payer: Medicare Other

## 2021-04-12 DIAGNOSIS — M86672 Other chronic osteomyelitis, left ankle and foot: Secondary | ICD-10-CM | POA: Diagnosis not present

## 2021-04-12 DIAGNOSIS — E43 Unspecified severe protein-calorie malnutrition: Secondary | ICD-10-CM | POA: Diagnosis not present

## 2021-04-12 DIAGNOSIS — Z880 Allergy status to penicillin: Secondary | ICD-10-CM

## 2021-04-12 DIAGNOSIS — R7881 Bacteremia: Secondary | ICD-10-CM | POA: Diagnosis not present

## 2021-04-12 DIAGNOSIS — N179 Acute kidney failure, unspecified: Secondary | ICD-10-CM | POA: Diagnosis not present

## 2021-04-12 DIAGNOSIS — Z7189 Other specified counseling: Secondary | ICD-10-CM | POA: Diagnosis not present

## 2021-04-12 DIAGNOSIS — Z515 Encounter for palliative care: Secondary | ICD-10-CM | POA: Diagnosis not present

## 2021-04-12 DIAGNOSIS — L03115 Cellulitis of right lower limb: Secondary | ICD-10-CM | POA: Diagnosis not present

## 2021-04-12 DIAGNOSIS — R531 Weakness: Secondary | ICD-10-CM

## 2021-04-12 DIAGNOSIS — G9340 Encephalopathy, unspecified: Secondary | ICD-10-CM

## 2021-04-12 DIAGNOSIS — B965 Pseudomonas (aeruginosa) (mallei) (pseudomallei) as the cause of diseases classified elsewhere: Secondary | ICD-10-CM | POA: Diagnosis not present

## 2021-04-12 DIAGNOSIS — A498 Other bacterial infections of unspecified site: Secondary | ICD-10-CM | POA: Diagnosis not present

## 2021-04-12 LAB — COMPREHENSIVE METABOLIC PANEL
ALT: 40 U/L (ref 0–44)
AST: 44 U/L — ABNORMAL HIGH (ref 15–41)
Albumin: 2.2 g/dL — ABNORMAL LOW (ref 3.5–5.0)
Alkaline Phosphatase: 112 U/L (ref 38–126)
Anion gap: 13 (ref 5–15)
BUN: 43 mg/dL — ABNORMAL HIGH (ref 8–23)
CO2: 21 mmol/L — ABNORMAL LOW (ref 22–32)
Calcium: 8.2 mg/dL — ABNORMAL LOW (ref 8.9–10.3)
Chloride: 121 mmol/L — ABNORMAL HIGH (ref 98–111)
Creatinine, Ser: 1.86 mg/dL — ABNORMAL HIGH (ref 0.44–1.00)
GFR, Estimated: 29 mL/min — ABNORMAL LOW (ref >60)
Glucose, Bld: 139 mg/dL — ABNORMAL HIGH (ref 70–99)
Potassium: 2.9 mmol/L — ABNORMAL LOW (ref 3.5–5.1)
Sodium: 155 mmol/L — ABNORMAL HIGH (ref 135–145)
Total Bilirubin: 0.6 mg/dL (ref 0.3–1.2)
Total Protein: 6.1 g/dL — ABNORMAL LOW (ref 6.5–8.1)

## 2021-04-12 LAB — CBC
HCT: 29.9 % — ABNORMAL LOW (ref 36.0–46.0)
Hemoglobin: 8.6 g/dL — ABNORMAL LOW (ref 12.0–15.0)
MCH: 30 pg (ref 26.0–34.0)
MCHC: 28.8 g/dL — ABNORMAL LOW (ref 30.0–36.0)
MCV: 104.2 fL — ABNORMAL HIGH (ref 80.0–100.0)
Platelets: 282 10*3/uL (ref 150–400)
RBC: 2.87 MIL/uL — ABNORMAL LOW (ref 3.87–5.11)
RDW: 20.4 % — ABNORMAL HIGH (ref 11.5–15.5)
WBC: 9.9 10*3/uL (ref 4.0–10.5)
nRBC: 0.3 % — ABNORMAL HIGH (ref 0.0–0.2)

## 2021-04-12 LAB — PHOSPHORUS
Phosphorus: 1.7 mg/dL — ABNORMAL LOW (ref 2.5–4.6)
Phosphorus: 2.1 mg/dL — ABNORMAL LOW (ref 2.5–4.6)

## 2021-04-12 LAB — MAGNESIUM
Magnesium: 2.3 mg/dL (ref 1.7–2.4)
Magnesium: 2.4 mg/dL (ref 1.7–2.4)

## 2021-04-12 LAB — GLUCOSE, CAPILLARY
Glucose-Capillary: 126 mg/dL — ABNORMAL HIGH (ref 70–99)
Glucose-Capillary: 143 mg/dL — ABNORMAL HIGH (ref 70–99)

## 2021-04-12 MED ORDER — DEXTROSE 5 % IV SOLN: INTRAVENOUS | Status: DC

## 2021-04-12 MED ORDER — JUVEN PO PACK
1.0000 | PACK | Freq: Two times a day (BID) | ORAL | Status: DC
  Administered 2021-04-12 – 2021-04-16 (×8): 1
  Filled 2021-04-12 (×10): qty 1

## 2021-04-12 MED ORDER — POTASSIUM CHLORIDE 10 MEQ/100ML IV SOLN
10.0000 meq | INTRAVENOUS | Status: AC
  Administered 2021-04-12 (×6): 10 meq via INTRAVENOUS
  Filled 2021-04-12 (×6): qty 100

## 2021-04-12 MED ORDER — PIPERACILLIN-TAZOBACTAM 3.375 G IVPB
3.3750 g | Freq: Three times a day (TID) | INTRAVENOUS | Status: DC
  Administered 2021-04-13 – 2021-04-17 (×12): 3.375 g via INTRAVENOUS
  Filled 2021-04-12 (×14): qty 50

## 2021-04-12 MED ORDER — OSMOLITE 1.2 CAL PO LIQD
1000.0000 mL | ORAL | Status: DC
  Administered 2021-04-12 – 2021-04-16 (×5): 1000 mL

## 2021-04-12 MED ORDER — ADULT MULTIVITAMIN W/MINERALS CH
1.0000 | ORAL_TABLET | Freq: Every day | ORAL | Status: DC
  Administered 2021-04-13 – 2021-04-16 (×4): 1
  Filled 2021-04-12 (×4): qty 1

## 2021-04-12 NOTE — Progress Notes (Addendum)
PROGRESS NOTE  [name] [name]  DOB: 1951/10/02  PCP: [name], MD TKZ:[id number]  DOA: 04/04/2021  LOS: 6 days   Chief Complaint  Patient presents with  . Altered Mental Status    Brief narrative: [name] [name] is a 70 y.o. female with PMH significant for scleroderma diagnosed in 1968, crest syndrome, pulmonary HTN, on continuous oxygen between 2-3L [place], non Hodgkin lymphomaof the brains/p chemotherapy in remission, PAD, severe PVD s/p multiple stents and ablations, CHF, DVT s/p IVC filter-not on anticoagulation due to GI bleed, chronic anemiarequiring IV iron transfusion, 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 Patient was brought from home to the ED on 04/19/2021 for altered mental status and worsening of chronic leg wounds.  During the course of this hospitalization, patient was noted to have Pseudomonas aeruginosa bacteremia likely due to osteomyelitis of her foot.  Patient continued to decline amputation like she did in the past. ID, palliative consultations were obtained. Patient hospitalized and is prolonged because of persistently altered mental status change. See below for details  Subjective: Patient was seen and examined this morning.  Pleasant elderly Caucasian female.  Lying down in bed.  Not awake, but continues to have nonpurposeful movements of her head and arms..  Husband at bedside.  Confirms full CODE STATUS.  Is concerned about her nutritional status.  We discussed about options.  Agrees to NG tube feeding.  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  In the past, she refused amputation.   -Presented with worsening wound, leukocytosis to 23,000,  elevated lactic acid to 2.4. -4/8, MRI left foot showed abnormal edema distally in the prox phalanx of the small toe suspicious for active osteomyelitis.  -Blood culture from 4/7 grew Pseudomonas aeruginosa.  Repeat blood culture on 4/9 negative -Currently on renally dosed Zosyn. -Since patient has been traditionally refusing amputation, she may need prolonged antibiotic course.  -Wound care consult obtained.  But it seems that her right lower extremity wound is not salvageable.  I texted Dr. Sharol Given.  He will see the patient tomorrow morning. -White count and lactic acid level improved to normal. Recent Labs  Lab 04/12/2021 1623 04/27/2021 2341 04/07/21 0006 04/07/21 0204 04/07/21 0553 04/08/21 0523 04/08/21 1234 04/09/21 0615 04/09/21 2202 04/10/21 0648 04/11/21 0525 04/12/21 0553  WBC  --   --    < >  --   --    < > 16.7* 17.7*  --  15.1* 13.9* 9.9  LATICACIDVEN 1.1 2.4*  --  2.4* 1.8  --   --   --  1.8  --   --   --    < > = values in this interval not displayed.   Acute metabolic encephalopathy -Unclear etiology.  Probably multifactorial: Dehydration, hypernatremia, infection, scleroderma, history of craniectomy -4/12 CT head did not show any acute abnormality. 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 -On IV fluid for dehydration.  Switch to dextrose drip because of hypernatremia  AKI on CKD 3B -Creatinine at baseline less than 1.4.  Currently elevated and trending up. -Monitor on IV fluid. Recent Labs    02/20/21  0000 03/03/21 1522 04/04/2021 1539 04/07/21 0006 04/08/21 0523 04/09/21 0615 04/09/21 2202 04/10/21 0648 04/11/21 0525 04/12/21 0553  BUN 31* 41* 49* 44* 45* 46* 44* 45* 39* 43*  CREATININE 1.19* 1.31* 1.27* 1.16* 1.24* 1.14* 1.44* 1.50* 1.75* 1.86*   Hypernatremia -Sodium  level trending up because of poor oral intake, 155 today. -IV fluid switch to dextrose drip today.  Currently running at 75 mL/h.  Continue to monitor sodium level. Recent Labs  Lab 04/04/2021 1539 04/07/21 0006 04/08/21 0523 04/09/21 0615 04/09/21 2202 04/10/21 0648 04/11/21 0525 04/12/21 0553  NA 128* 130* 134* 135 142 140 148* 155*   Hypokalemia/hypophosphatemia -Potassium level low at 2.9 and phosphorus level low at 1.7 today. -IV replacement ordered.  If NG tube insertion is successful, can give enteral replacement as well. Recent Labs  Lab 04/12/2021 1623 04/07/21 0006 04/09/21 0615 04/09/21 2202 04/10/21 5427 04/11/21 0525 04/12/21 0553 04/12/21 1141  K  --    < > 3.9 4.1 3.4* 3.8 2.9*  --   MG 2.0  --   --   --   --   --   --  2.3  PHOS  --   --   --   --   --   --   --  1.7*   < > = values in this interval not displayed.   Anemia of chronic disease  hx of GI bleed -Baseline hemoglobin between 9-10. -No active bleeding but hemoglobin this admission is running between 8-9.   -Continue monitor Recent Labs    07/08/20 1134 10/04/20 0629 11/21/20 1603 11/22/20 0310 01/10/21 0933 01/24/21 0000 01/26/21 1508 01/26/21 1509 02/05/21 1608 02/08/21 0530 02/09/21 0500 03/03/21 1521 03/03/21 1522 04/09/2021 1539 04/08/21 1234 04/09/21 0615 04/10/21 0648 04/11/21 0525 04/12/21 0553  HGB 10.0*   < > 5.3*   < > 8.6*   < > 9.3*  --    < >  --    < >  --  10.1*   < > 7.2* 8.2* 7.8* 8.0* 8.6*  MCV 100.0   < > 113.1*   < > 95.1   < > 101.0*  --    < >  --    < >  --  96.4   < > 98.3 96.7 100.0 105.1* 104.2*  VITAMINB12  --   --  742  --  675  --  865  --   --  1,429*  --   --   --   --   --   --   --   --   --   FOLATE  --   --  106.8  --  21.3  --   --  94.9  --  58.1  --   --   --   --   --   --   --   --   --   FERRITIN  --   --  195  --  179  --  270  --   --   --   --   --  786*  --   --   --   --   --   --   TIBC  --   --  376  --  269  --  270  --   --   --   --    --  198*  --   --   --   --   --   --  IRON 95  --  39  --  41*  --  29*  --   --   --   --   --  21*  --   --   --   --   --   --   RETICCTPCT  --   --  10.5*  --   --   --  2.4  --   --   --   --  1.3  --   --   --   --   --   --   --    < > = values in this interval not displayed.   Scleroderma Crest syndrome  interstitial lung disease with chronic hypoxemia Pulmonary hypertension -Chronically on prednisone.  Currently on IV hydrocortisone 50 mg daily.   -On Revatio and macitentan for pulmonary hypertension -On 2 to 3 L oxygen by nasal cannula at baseline -Follows with outpatient pulmonology  PAD -Had been continued with Plavix as tolerated  Paroxysmal atrial fibrillation -Not on anticoagulation due to previous GI bleed  History of DVT -Has IVC filter in place Seems to be stable at this time  Hyperlipidemia -Off statin due to myalgia  Goals of care -Patient's overall clinical status seems to be worsening.  Palliative care consultation was obtained.  Husband chose to keep her full code.  He is concerned about her nutritional status.  NG tube feeding planned.  NG tube placement by nursing unsuccessful.  IR order placed.  Mobility: may encourage ambulation after mental status improves Code Status:   Code Status: Full Code  Nutritional status: Body mass index is 18.34 kg/m. Nutrition Problem: Severe Malnutrition Etiology: chronic illness,wound healing Signs/Symptoms: severe fat depletion,severe muscle depletion Diet Order            Diet Heart Room service appropriate? Yes; Fluid consistency: Thin  Diet effective now                 DVT prophylaxis: heparin injection 5,000 Units Start: 04/07/21 1500   Antimicrobials:  IV Zosyn Fluid: Dextrose drip at 75 mL/h Consultants: Palliative care, ID Family Communication:  Husband at bedside  Status is: Inpatient  Remains inpatient appropriate because -continue to remain altered  Dispo: The patient is from: Home               Anticipated d/c is to: Home versus SNF              Patient currently is not medically stable to d/c.   Difficult to place patient No       Infusions:  . sodium chloride Stopped (04/12/21 0600)  . dextrose 75 mL/hr at 04/12/21 0936  . dextrose 5 % and 0.9% NaCl Stopped (04/12/21 0935)  . feeding supplement (OSMOLITE 1.2 CAL)    . [START ON 04/13/2021] piperacillin-tazobactam (ZOSYN)  IV    . potassium chloride 10 mEq (04/12/21 1415)    Scheduled Meds: . citalopram  20 mg Oral QHS  . clopidogrel  75 mg Oral Daily  . cycloSPORINE  2 drop Both Eyes BID  . feeding supplement  237 mL Oral BID BM  . folic acid  1 mg Oral Daily  . heparin  5,000 Units Subcutaneous Q8H  . hydrocortisone sod succinate (SOLU-CORTEF) inj  50 mg Intravenous Daily  . levothyroxine  50 mcg Oral Q0600  . magnesium oxide  200 mg Oral BID  . [START ON 04/13/2021] multivitamin with minerals  1 tablet Per Tube Daily  . nutrition  supplement (JUVEN)  1 packet Per Tube BID WC  . pantoprazole (PROTONIX) IV  40 mg Intravenous Q24H  . vitamin B-12  1,000 mcg Oral Daily    Antimicrobials: Anti-infectives (From admission, onward)   Start     Dose/Rate Route Frequency Ordered Stop   04/13/21 0600  piperacillin-tazobactam (ZOSYN) IVPB 3.375 g        3.375 g 12.5 mL/hr over 240 Minutes Intravenous Every 8 hours 04/12/21 1055     04/12/21 0600  ceFEPIme (MAXIPIME) 2 g in sodium chloride 0.9 % 100 mL IVPB  Status:  Discontinued        2 g 200 mL/hr over 30 Minutes Intravenous Every 24 hours 04/11/21 0712 04/12/21 1055   04/08/21 0600  vancomycin (VANCOREADY) IVPB 1000 mg/200 mL  Status:  Discontinued        1,000 mg 200 mL/hr over 60 Minutes Intravenous Every 36 hours 04/07/21 0059 04/08/21 0828   04/07/21 0600  ceFEPIme (MAXIPIME) 2 g in sodium chloride 0.9 % 100 mL IVPB  Status:  Discontinued        2 g 200 mL/hr over 30 Minutes Intravenous Every 12 hours 04/07/21 0059 04/11/21 0712   04/09/2021 1715   vancomycin (VANCOCIN) IVPB 1000 mg/200 mL premix        1,000 mg 200 mL/hr over 60 Minutes Intravenous  Once 04/27/2021 1711 03/31/2021 2045   04/25/2021 1715  ceFEPIme (MAXIPIME) 2 g in sodium chloride 0.9 % 100 mL IVPB        2 g 200 mL/hr over 30 Minutes Intravenous  Once 04/27/2021 1711 04/11/2021 1800      PRN meds: sodium chloride, acetaminophen, HYDROmorphone (DILAUDID) injection, oxyCODONE-acetaminophen   Objective: Vitals:   04/11/21 2030 04/12/21 0430  BP: (!) 154/55 (!) 155/69  Pulse: 97 90  Resp:    Temp: 97.8 F (36.6 C) 98.8 F (37.1 C)  SpO2: 100% 100%    Intake/Output Summary (Last 24 hours) at 04/12/2021 1421 Last data filed at 04/12/2021 0935 Gross per 24 hour  Intake 3889.11 ml  Output --  Net 3889.11 ml   Filed Weights   04/01/2021 1502 04/25/2021 2300  Weight: 54 kg 53.9 kg   Weight change:  Body mass index is 18.34 kg/m.   Physical Exam: General exam: Elderly Caucasian female.  Tries to open eyes and sternal rub.  Making involuntary movements  skin: No rashes, lesions or ulcers. HEENT: Atraumatic, normocephalic, no obvious bleeding Lungs: Clear to auscultation bilaterally CVS: Regular rate and rhythm, no murmur GI/Abd soft, nontender, nondistended, bowel sound present CNS: Altered, tries to open eyes on sternal rub, nonpurposeful movement of head noted. Psychiatry: Unable to examine because of altered mental status Extremities: Chronic wound on the legs bandaged.  No pedal edema.  Data Review: I have personally reviewed the laboratory data and studies available.  Recent Labs  Lab 04/03/2021 1539 04/07/21 0006 04/08/21 1234 04/09/21 0615 04/10/21 0648 04/11/21 0525 04/12/21 0553  WBC 22.8*   < > 16.7* 17.7* 15.1* 13.9* 9.9  NEUTROABS 20.8*  --   --   --   --   --   --   HGB 8.0*   < > 7.2* 8.2* 7.8* 8.0* 8.6*  HCT 25.3*   < > 23.3* 26.3* 25.7* 27.0* 29.9*  MCV 96.9   < > 98.3 96.7 100.0 105.1* 104.2*  PLT 364   < > 341 408* 334 312 282   < > =  values in this interval not displayed.  Recent Labs  Lab 04/18/2021 1623 04/07/21 0006 04/09/21 0615 04/09/21 2202 04/10/21 3125 04/11/21 0525 04/12/21 0553 04/12/21 1141  NA  --    < > 135 142 140 148* 155*  --   K  --    < > 3.9 4.1 3.4* 3.8 2.9*  --   CL  --    < > 99 101 104 113* 121*  --   CO2  --    < > 17* 16* 14* 16* 21*  --   GLUCOSE  --    < > 72 75 77 103* 139*  --   BUN  --    < > 46* 44* 45* 39* 43*  --   CREATININE  --    < > 1.14* 1.44* 1.50* 1.75* 1.86*  --   CALCIUM  --    < > 8.4* 8.8* 8.4* 8.4* 8.2*  --   MG 2.0  --   --   --   --   --   --  2.3  PHOS  --   --   --   --   --   --   --  1.7*   < > = values in this interval not displayed.    F/u labs ordered Unresulted Labs (From admission, onward)          Start     Ordered   04/13/21 0871  Basic metabolic panel  Daily,   R     Question:  Specimen collection method  Answer:  Lab=Lab collect   04/12/21 1301   04/13/21 0500  CBC  Daily,   R     Question:  Specimen collection method  Answer:  Lab=Lab collect   04/12/21 1301   04/13/21 0500  Magnesium  Tomorrow morning,   STAT       Question:  Specimen collection method  Answer:  Lab=Lab collect   04/12/21 1301   04/13/21 0500  Phosphorus  Tomorrow morning,   R       Question:  Specimen collection method  Answer:  Lab=Lab collect   04/12/21 1301   04/12/21 1129  Magnesium  (ICU Tube Feeding: PEPuP )  5A & 5P,   R     Question:  Specimen collection method  Answer:  Lab=Lab collect   04/12/21 1128   04/12/21 1129  Phosphorus  (ICU Tube Feeding: PEPuP )  5A & 5P,   R     Question:  Specimen collection method  Answer:  Lab=Lab collect   04/12/21 1128   04/12/21 0500  Creatinine, serum  Daily,   R     Question:  Specimen collection method  Answer:  Lab=Lab collect   04/11/21 0719   Unscheduled  Occult blood card to lab, stool RN will collect  As needed,   R     Question:  Specimen to be collected by:  Answer:  RN will collect   04/07/21 0030           Signed, Terrilee Croak, MD [facility] 04/12/2021

## 2021-04-12 NOTE — Progress Notes (Signed)
Pharmacy Antibiotic Note  [name] [name] is a 70 y.o. female with hx scleroderma and chronic leg wounds with PsA colonization presented to the ED on 04/28/2021 with AMS.  Blood culture collected on 4/7 has PsA and left foot MRI showed findings with concern for osteomyelitis.    She has been having some encephalopathy, although there is low suspicion it is from the cefepime we will change to zosyn just in case.   The patient has a penicillin allergy, I spoke with her husband who endorses that her reaction was a rash to penicillin about 40 years ago. She ha tolerated keflex and other cephalosporins since this reaction. There is a high likelihood she has outgrown this allergy and we have agreed to directly IV challenge her.   Plan: - Stop cefepime - Start Zosyn  - monitor renal function closely - f/u cultures  ___________________________________________  Height: 5' 7.5" (171.5 cm) Weight: 53.9 kg (118 lb 13.3 oz) IBW/kg (Calculated) : 62.75  Temp (24hrs), Avg:98.1 F (36.7 C), Min:97.8 F (36.6 C), Max:98.8 F (37.1 C)  Recent Labs  Lab 04/14/2021 1623 04/23/2021 2341 04/07/21 0006 04/07/21 0204 04/07/21 0553 04/08/21 0523 04/08/21 1234 04/09/21 0615 04/09/21 2202 04/10/21 0648 04/11/21 0525 04/12/21 0553  WBC  --   --    < >  --   --    < > 16.7* 17.7*  --  15.1* 13.9* 9.9  CREATININE  --   --    < >  --   --    < >  --  1.14* 1.44* 1.50* 1.75* 1.86*  LATICACIDVEN 1.1 2.4*  --  2.4* 1.8  --   --   --  1.8  --   --   --    < > = values in this interval not displayed.    Estimated Creatinine Clearance: 23.6 mL/min (A) (by C-G formula based on SCr of 1.86 mg/dL (H)).    Allergies  Allergen Reactions  . Levofloxacin Other (See Comments)    FLU LIKE SYMPTOM   . Sulfa Antibiotics Rash and Other (See Comments)    Face peeled, Severe rash with significant peeling of face. No airway involvement per patient   . Doxycycline Hyclate Swelling    SWELLING REACTION UNSPECIFIED ;  "TABLETS ONLY - CAPSULES ARE TOLERATED FINE"  . Penicillins Rash    Has patient had a PCN reaction causing immediate rash, facial/tongue/throat swelling, SOB or lightheadedness with hypotension: no Has patient had a PCN reaction causing severe rash involving mucus membranes or skin necrosis: no Has patient had a PCN reaction that required hospitalization: no Has patient had a PCN reaction occurring within the last 10 years: {no If all of the above answers are "NO", then may proceed with Cephalosporin use.   Antimicrobials this admission: 4/7 vanc >>4/9 4/7 cefepime >> 4/13 4/13 zosyn >>   Microbiology results: 4/7 BCx x2: 1/4 GNR, PsA 4/7 UCx: ngF 4/9 BCx2:  4/7 salmonella: neg    Thank you for allowing pharmacy to be a part of this patient's care.  Phillis Haggis 04/12/2021 11:45 AM

## 2021-04-12 NOTE — Progress Notes (Signed)
[facility] for Infectious Disease  Date of Admission:  04/16/2021           Reason for visit: Follow up on PsA bacteremia and OM  Current antibiotics: Pip Tazo 4/13--present   Previous antibiotics: Vancomycin 4/7--4/8 Cefepime 4/7--4/13  ASSESSMENT:    1. 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  Blood cultures 04/29/2021 are positive for Pseudomonas aeruginosa.  Repeat blood cultures 04/08/2021 no growth to date.  Per chart review patient has previously declined recommendation for amputation 2. 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 PCN Allergy: Listed in her chart.  We spoke with her husband who endorses a rash about 40 years ago.  She has tolerated Keflex another cephalosporin since this reaction.  There is a high likelihood she has outgrown this allergy and we have all agreed to directly challenge her. 4. Encephalopathy: Likely multifactorial 5. Acute kidney injury on CKD: Creatinine continues to increase, but hopefully starting to plateau 6. Peripheral vascular disease: Status post multiple stents and ablations previously 7. CREST 8. Pulmonary hypertension on continuous oxygen  PLAN:    . Will change antibiotics to renally dosed piperacillin tazobactam to remove cefepime induced neurotoxicity from the differential although overall suspicion for this is low . Continue wound care . Appreciate palliative care discussions . Unlikely to heal these wounds without more definitive source control such as amputation, however, patient has previously declined this recommendation. Appreciate orthopedic surgery evaluation.  Dr. Sharol Given to see patient tomorrow morning     Principal Problem:   Cellulitis, leg Active Problems:   Peripheral arterial occlusive disease (HCC)   Pulmonary hypertension associated with systemic disorder  (HCC)   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HCC)   Scleroderma (HCC)   Chronic respiratory failure with hypoxia (HCC)   ILD (interstitial lung disease) ([place])   AKI (acute kidney injury) ([place])   PAF (paroxysmal atrial fibrillation) (HCC)   Wound cellulitis   SIRS (systemic inflammatory response syndrome) (HCC)   Pseudomonas aeruginosa infection   Protein-calorie malnutrition, severe    MEDICATIONS:    Scheduled Meds: . citalopram  20 mg Oral QHS  . clopidogrel  75 mg Oral Daily  . cycloSPORINE  2 drop Both Eyes BID  . feeding supplement  237 mL Oral BID BM  . folic acid  1 mg Oral Daily  . heparin  5,000 Units Subcutaneous Q8H  . hydrocortisone sod succinate (SOLU-CORTEF) inj  50 mg Intravenous Daily  . levothyroxine  50 mcg Oral Q0600  . magnesium oxide  200 mg Oral BID  . [START ON 04/13/2021] multivitamin with minerals  1 tablet Per Tube Daily  . nutrition supplement (JUVEN)  1 packet Per Tube BID WC  . pantoprazole (PROTONIX) IV  40 mg Intravenous Q24H  . vitamin B-12  1,000 mcg Oral Daily   Continuous Infusions: . sodium chloride Stopped (04/12/21 0600)  . dextrose 75 mL/hr at 04/12/21 0936  . dextrose 5 % and 0.9% NaCl Stopped (04/12/21 0935)  . feeding supplement (OSMOLITE 1.2 CAL)    . [START ON 04/13/2021] piperacillin-tazobactam (ZOSYN)  IV    . potassium chloride 10 mEq (04/12/21 1415)   PRN Meds:.sodium chloride, acetaminophen, HYDROmorphone (DILAUDID) injection, oxyCODONE-acetaminophen  SUBJECTIVE:   24 hour events:  No acute events noted  Patient is lethargic this morning after receiving Dilaudid.  Her husband is present in the room.  Review of Systems  Unable to perform ROS: Mental status change      OBJECTIVE:   Blood pressure (!) 152/94, pulse 98, temperature 98.5 F (36.9 C), temperature source Oral, resp. rate 20, height 5' 7.5" (1.715 m), weight 53.9 kg, SpO2 100 %. Body mass index is 18.34 kg/m.  Physical Exam Constitutional:      Comments:  Cachectic, chronically ill-appearing woman, lying in bed, mild distress.  HENT:     Head: Normocephalic and atraumatic.     Mouth/Throat:     Mouth: Mucous membranes are dry.  Eyes:     Extraocular Movements: Extraocular movements intact.     Conjunctiva/sclera: Conjunctivae normal.  Pulmonary:     Comments: On nasal cannula, increased WOB Skin:    General: Skin is warm and dry.     Findings: No rash.     Comments: Pictures of wounds reviewed in the media tab  Neurological:     General: No focal deficit present.     Comments: Not alert or oriented.      Lab Results: Lab Results  Component Value Date   WBC 9.9 04/12/2021   HGB 8.6 (L) 04/12/2021   HCT 29.9 (L) 04/12/2021   MCV 104.2 (H) 04/12/2021   PLT 282 04/12/2021    Lab Results  Component Value Date   NA 155 (H) 04/12/2021   K 2.9 (L) 04/12/2021   CO2 21 (L) 04/12/2021   GLUCOSE 139 (H) 04/12/2021   BUN 43 (H) 04/12/2021   CREATININE 1.86 (H) 04/12/2021   CALCIUM 8.2 (L) 04/12/2021   GFRNONAA 29 (L) 04/12/2021   GFRAA 50 (L) 01/10/2021    Lab Results  Component Value Date   ALT 40 04/12/2021   AST 44 (H) 04/12/2021   ALKPHOS 112 04/12/2021   BILITOT 0.6 04/12/2021       Component Value Date/Time   CRP 16.8 (H) 04/11/2021 0525       Component Value Date/Time   ESRSEDRATE 138 (H) 04/11/2021 0525     I have reviewed the micro and lab results in Epic.  Imaging: CT HEAD WO CONTRAST  Result Date: 04/11/2021 CLINICAL DATA:  Altered mental status. Weakness. EXAM: CT HEAD WITHOUT CONTRAST TECHNIQUE: Contiguous axial images were obtained from the base of the skull through the vertex without intravenous contrast. COMPARISON:  04/07/2021 FINDINGS: Brain: Stable moderately enlarged ventricles and subarachnoid spaces. Mild-to-moderate patchy white matter low density in both cerebral hemispheres without significant change. Stable right cerebellar hemisphere encephalomalacia beneath a craniectomy defect. Stable  small left posterior parietal infarct. No intracranial hemorrhage, mass lesion or CT evidence of acute infarction. Vascular: No hyperdense vessel or unexpected calcification. Skull: Stable right occipital post craniectomy changes. Otherwise, unremarkable. Sinuses/Orbits: Unremarkable. Other: Stable small right frontal scalp mass, possibly representing a lipoma. IMPRESSION: 1. No acute abnormality. 2. 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 Electronically Signed   By: Claudie Revering M.D.   On: 04/11/2021 16:39   DG CHEST PORT 1 VIEW  Result Date: 04/12/2021 CLINICAL DATA:  Nasogastric tube positioning EXAM: PORTABLE CHEST 1 VIEW COMPARISON:  Portable exam 1431 hours compared to earlier study of 12/30 9 hours FINDINGS: Tip of nasogastric tube now projects over stomach. Remainder of chest unchanged since preceding exam. Atherosclerotic calcification aorta. IVC filter noted. Paucity of bowel gas in the upper abdomen. IMPRESSION: Tip of nasogastric tube now projects over stomach. Findings  discussed with Maudie Mercury RN on [place] on 04/12/2021 at 1445 hours. Electronically Signed   By: Lavonia Dana M.D.   On: 04/12/2021 14:47   DG CHEST PORT 1 VIEW  Result Date: 04/12/2021 CLINICAL DATA:  Nasogastric tube placement EXAM: PORTABLE CHEST 1 VIEW COMPARISON:  Portable exam 1239 hours compared to 04/13/2021 FINDINGS: Nasogastric tube extends into RIGHT lower lobe bronchus recommend withdrawal and replacement. Patient rotated to the LEFT. Upper normal size of cardiac silhouette. Atherosclerotic calcifications aorta. Chronic accentuation of pulmonary markings. Lungs otherwise clear. No pleural effusion or pneumothorax. Bones demineralized. IMPRESSION: Tip of nasogastric tube is in the RIGHT lower lobe bronchus, recommend withdrawal and replacement. Critical Value/emergent  results were called by telephone at the time of interpretation on 04/12/2021 at 1434 hours to [facility] on [place], who verbally acknowledged these results. Electronically Signed   By: Lavonia Dana M.D.   On: 04/12/2021 14:35   EEG adult  Result Date: 04/11/2021 Jaynie Bream, MD     04/11/2021 11:11 PM ROUTINE EEG REPORT REFERRING PHYSICIAN:  Marylu Lund MD DATE OF STUDY:  04/11/2021 HISTORY: 70yo woman with history of scleroderma, NHL with cerebral involvement s/p chemotherapy in remission, chronic leg wounds with confusion, evaluate for seizure as etiology. DESCRIPTION: Diffuse EMG artifact obscured the majority of the recording.  A slow but continuous background could be seen in the posterior leads with an admixture of high amplitude delta>theta activity between 2-6 hz.  Rhythmic sharply contoured delta at 2 Hz was seen in the posterior leads throughout much of the recording at times with a triphasic morphology- favored to be generalized but most of the anterior leads on the recording were obscured by artifact. No sleep structures were seen. CLASSIFICATION (EEG IMPRESSION): Abnormal Significance III (stuporous) 1. Artifact obscured EEG 2. Technically difficult 3. Continuous slow, generalized 4. Intermittent Rhythmic Slow, Generalized with Sharp activity (GRDA+S) CLINICAL INTERPRETATION: 1) The study was extremely limited by diffuse artifact.  Subtle abnormalities will be missed. 2) There is evidence of a diffuse severe encephalopathy with cortical irritability.  Neurology consultation and repeat EEG are recommended. Colby A. Marvel Plan, MD Neurology and Clinical Neurophysiology     [facility] for Infectious Disease [facility] [phone] pager 04/12/2021, 4:18 PM

## 2021-04-12 NOTE — Progress Notes (Signed)
Unable to document measurement on leg wounds, they are wrapped and there no orders for wound care to legs.

## 2021-04-12 NOTE — Progress Notes (Signed)
Patient husband at bedside with dressing supplies brought from home and is changing both lower leg dressings.

## 2021-04-12 NOTE — Progress Notes (Signed)
Daily Progress Note   Patient Name: [name] [name]       Date: 04/12/2021 DOB: 01-01-1951  Age: 70 y.o. MRN#: [id number] Attending Physician: Terrilee Croak, MD Primary Care Physician: [name], MD Admit Date: 04/23/2021  Reason for Consultation/Follow-up: Establishing goals of care  Subjective:  resting in bed, mild distress. Husband at bedside.   Length of Stay: 6  Current Medications: Scheduled Meds:  . citalopram  20 mg Oral QHS  . clopidogrel  75 mg Oral Daily  . cycloSPORINE  2 drop Both Eyes BID  . feeding supplement  237 mL Oral BID BM  . folic acid  1 mg Oral Daily  . heparin  5,000 Units Subcutaneous Q8H  . hydrocortisone sod succinate (SOLU-CORTEF) inj  50 mg Intravenous Daily  . levothyroxine  50 mcg Oral Q0600  . macitentan  10 mg Oral Daily  . magnesium oxide  200 mg Oral BID  . multivitamin with minerals  1 tablet Oral Daily  . nutrition supplement (JUVEN)  1 packet Oral BID WC  . pantoprazole (PROTONIX) IV  40 mg Intravenous Q24H  . vitamin B-12  1,000 mcg Oral Daily    Continuous Infusions: . sodium chloride Stopped (04/12/21 0600)  . dextrose 75 mL/hr at 04/12/21 0936  . dextrose 5 % and 0.9% NaCl Stopped (04/12/21 0935)  . [START ON 04/13/2021] piperacillin-tazobactam (ZOSYN)  IV    . potassium chloride 10 mEq (04/12/21 0939)    PRN Meds: sodium chloride, acetaminophen, HYDROmorphone (DILAUDID) injection, oxyCODONE-acetaminophen  Physical Exam         Resting in bed, eyes closed, doesn't verbalize No distress currently Regular work of breathing Appears cachectic Pictures of wounds from media section on the chart have been viewed.   Vital Signs: BP (!) 155/69 (BP Location: Right Arm)   Pulse 90   Temp 98.8 F (37.1 C) (Oral)   Resp 20   Ht  5' 7.5" (1.715 m)   Wt 53.9 kg   SpO2 100%   BMI 18.34 kg/m  SpO2: SpO2: 100 % O2 Device: O2 Device: Nasal Cannula O2 Flow Rate: O2 Flow Rate (L/min): 2 L/min  Intake/output summary:   Intake/Output Summary (Last 24 hours) at 04/12/2021 1108 Last data filed at 04/12/2021 0935 Gross per 24 hour  Intake 3789.11 ml  Output --  Net 3789.11 ml   LBM: Last BM Date: 04/14/2021 Baseline Weight: Weight: 54 kg Most recent weight: Weight: 53.9 kg      PPS 40% Palliative Assessment/Data:      Patient Active Problem List   Diagnosis Date Noted  . Protein-calorie malnutrition, severe 04/10/2021  . Pseudomonas aeruginosa infection   . SIRS (systemic inflammatory response syndrome) (HCC)   . Cellulitis, leg 04/01/2021  . Wound cellulitis 04/04/2021  . Open wound   . PAF (paroxysmal atrial fibrillation) ([place])   . AKI (acute kidney injury) ([place]) 02/07/2021  . Macrocytic anemia 02/07/2021  . Thrombocytosis 02/07/2021  . PAH (pulmonary artery hypertension) ([place]) 02/07/2021  . Chronic diastolic CHF (congestive heart failure) ([place]) 02/07/2021  . Non-healing wound of right lower extremity 02/07/2021  . Physical deconditioning 02/07/2021  . CREST syndrome ([place])   . Iron deficiency anemia due to chronic blood loss 01/26/2021  . Anemia due to chronic blood loss 01/18/2021  . Anxiety state 01/18/2021  . 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 Disorder of calcium metabolism 01/18/2021  . Gastro-esophageal reflux disease with esophagitis, without bleeding 01/18/2021  . Oxygen dependent 01/18/2021  . Ulcer of esophagus with bleeding 01/18/2021  . Gangrene of finger of right hand ([place]) 11/29/2020  . Malnutrition of moderate degree 11/22/2020  . GI bleed 11/21/2020  . Eustachian tube dysfunction, bilateral 07/29/2020  . 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 Wrist fracture 11/24/2019  . Multiple closed stable fractures of pubic ramus ([place]) 11/23/2019  . Distal  radius fracture, left 11/23/2019  . Fall at home, initial encounter 11/23/2019  . AMS (altered mental status) 11/12/2019  . Altered mental status 11/11/2019  . Hypomagnesemia 11/11/2019  . Osteoporosis 01/22/2019  . Hypothyroidism 06/16/2018  . Pulmonary hypertension associated with systemic disorder ([place]) 10/08/2017  . 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 Dry gangrene ([place]) 09/11/2017  . Medication reaction 09/11/2017  . ILD (interstitial lung disease) ([place]) 07/22/2017  . Nasal septal perforation 07/10/2017  . Chronic respiratory failure with hypoxia ([place]) 10/31/2016  . Atherosclerosis of native arteries of right leg with ulceration of unspecified site ([place]) 09/14/2016  . Prolonged Q-T interval on ECG 07/29/2016  . CKD (chronic kidney disease), stage III ([place]) 07/29/2016  . Scleroderma ([place]) 05/06/2016  . Depression 04/24/2016  . Extensor tenosynovitis of wrist, right 02/24/2016  . 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 Hypokalemia 10/07/2015  . 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[place]) 09/01/2015  . Allergic rhinitis 03/08/2015  . High risk medication use 04/09/2013  . Venous hypertension, chronic, with ulcer and inflammation ([place]) 01/29/2013  . Lymphoma, non-Hodgkin's ([place]) 01/27/2013  . Right heart failure ([place]) 01/27/2013  . Varicose veins of lower extremities with ulcer and inflammation ([place]) 07/24/2012  . 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[place]) 04/08/2012  . Pulmonary hypertension associated with systemic disorder ([place]) 03/25/2012  . Peripheral arterial occlusive disease () 08/10/2011  . Essential hypertension 08/10/2011  . Hyperlipidemia 08/10/2011  . Arterial embolus and thrombosis of lower extremity ([place]) 08/10/2011    Palliative Care Assessment & Plan   Patient Profile:  70 y.o.femalewith medical history significant forwith PMH of scleroderma diagnosed in 1968, crest syndrome,  pulmonary HTN, on continuous oxygen between 2-3L [place], non Hodgkin lymphomaof the brains/p chemotherapy in remission, PAD, severe PVD s/p multiple stents and ablations, CHF, DVT s/p IVC filter-not on anticoagulation due to GI bleed, anemiarequiring IV iron transfusion, chronic leg  ulcers with chronic pseudomonas colonizationwho presents as a transfer from [facility] ED for AMS and worsening chronic leg wounds.    Patient remains admitted to hospital medicine service, with ID colleagues also following, for pseudomonas aeruginosa bacteremia, likely with osteomyelitis foot, AKI on CKD. It is noted that patient reportedly declined amputation in the past. Remains on antibiotics. Palliative consult for goals of care discussions has been requested.   Assessment:  not awake not alert Appears in mild distress  Recommendations/Plan:   full code full scope care for now.   Trial of artificial nutrition Continue current mode of care.   Goals of Care and Additional Recommendations:  Limitations on Scope of Treatment: Full Scope Treatment  Code Status:    Code Status Orders  (From admission, onward)         Start     Ordered   04/27/2021 2323  Full code  Continuous        04/22/2021 2323        Code Status History    Date Active Date Inactive Code Status Order ID Comments User Context   02/07/2021 1344 02/10/2021 2358 Full Code [id number]  Dwyane Dee, MD ED   01/02/2021 1404 01/02/2021 2329 Full Code [id number]  Waynetta Sandy, MD Inpatient   11/21/2020 1747 11/24/2020 0106 Full Code [id number]  Hosie Poisson, MD ED   10/04/2020 1200 10/04/2020 2140 Full Code [id number]  Serafina [place], MD Inpatient   11/23/2019 1807 11/26/2019 1603 Full Code [id number]  Norval Morton, MD ED   11/13/2019 1032 11/15/2019 1822 DNR [id number]  Domenic Polite, MD Inpatient   11/11/2019 1835 11/13/2019 1032 Full Code [id number]  Barb Merino, MD ED   04/19/2017 0956 04/19/2017 1411 Full Code [id number]  Bensimhon,  Shaune Pascal, MD Inpatient   09/14/2016 2000 09/15/2016 1342 Full Code [id number]  Alvia Grove, PA-C Inpatient   09/07/2016 1107 09/07/2016 2014 Full Code [id number]  [name], MD Inpatient   07/29/2016 1214 08/01/2016 1719 Full Code [id number]  Willia Craze, NP ED   05/06/2016 2036 05/08/2016 1649 Full Code [id number]  Reubin Milan, MD Inpatient   10/05/2015 1752 10/08/2015 1502 Full Code [id number]  Eugenie Filler, MD Inpatient   12/17/2013 1036 12/18/2013 1643 Full Code [id number]  Conrad [place], NP Inpatient   Advance Care Planning Activity    Advance Directive Documentation   Flowsheet Row Most Recent Value  Type of Advance Directive Living will  Pre-existing out of facility DNR order (yellow form or pink MOST form) --  "MOST" Form in Place? --       Prognosis:   Unable to determine  Discharge Planning:  To Be Determined  Care plan was discussed with  Patient's husband at bedside.   Thank you for allowing the Palliative Medicine Team to assist in the care of this patient.   Time In: 10 Time Out: 10.25 Total Time 25 Prolonged Time Billed  no       Greater than 50%  of this time was spent counseling and coordinating care related to the above assessment and plan.  Loistine Chance, MD  Please contact Palliative Medicine Team phone at [phone] for questions and concerns.

## 2021-04-12 NOTE — Plan of Care (Signed)

## 2021-04-12 NOTE — Progress Notes (Signed)
Nutrition Follow-up  DOCUMENTATION CODES:   Severe malnutrition in context of chronic illness,Underweight  INTERVENTION:   Monitor magnesium, potassium, and phosphorus daily for at least 3 days, MD to replete as needed, as pt is at risk for refeeding syndrome.  Once tube is placed (recommend small bore): -Initiate Osmolite 1.2 @ 20 ml/hr, advance by 10 ml every 6 hours to goal rate of 60 ml/hr. Provides at goal 1728 kcals, 79g protein and 1180 ml H2O.  -Juven Fruit Punch BID via tube, each serving provides 95kcal and 2.5g of protein (amino acids glutamine and arginine) -Multivitamin with minerals daily   Once more alert/awake: -resume Ensure Enlive PO  NUTRITION DIAGNOSIS:   Severe Malnutrition related to chronic illness,wound healing as evidenced by severe fat depletion,severe muscle depletion. Ongoing.  GOAL:   Patient will meet greater than or equal to 90% of their needs  Not meeting.  MONITOR:   PO intake,Supplement acceptance,Labs,Weight trends,I & O's,Skin  REASON FOR ASSESSMENT:   Consult Enteral/tube feeding initiation and management  ASSESSMENT:   70 y.o. 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  MD consulted RD for initiation of NGT feeds. Pt continues to be non-responsive. Family agreeable to trial of enteral feeds. Will start slow and recommend monitor for refeeding syndrome. Per palliative care note, family desires full code.  Will place recommendations above for when tube is  placed.  Medications: Folic acid, MAG-OX, Multivitamin with minerals daily, Vitamin B-12, D5 infusion, IV KCl  Labs reviewed: Elevated Na Low K  Diet Order:   Diet Order            Diet Heart Room service appropriate? Yes; Fluid consistency: Thin  Diet effective now                 EDUCATION NEEDS:   Not appropriate for education at this time  Skin:  Skin Assessment: Skin Integrity Issues: Skin Integrity Issues:: Stage III,Other (Comment) Stage III: mid sacrum Other: left pre-tibial venous stasis ulcer  Last BM:  4/10  Height:   Ht Readings from Last 1 Encounters:  03/31/2021 5' 7.5" (1.715 m)    Weight:   Wt Readings from Last 1 Encounters:  03/31/2021 53.9 kg   BMI:  Body mass index is 18.34 kg/m.  Estimated Nutritional Needs:   Kcal:  1700-1900  Protein:  85-100g  Fluid:  1.9L/day  Clayton Bibles, MS, RD, LDN Inpatient Clinical Dietitian Contact information available via Amion

## 2021-04-13 DIAGNOSIS — N179 Acute kidney failure, unspecified: Secondary | ICD-10-CM | POA: Diagnosis not present

## 2021-04-13 DIAGNOSIS — L03115 Cellulitis of right lower limb: Secondary | ICD-10-CM | POA: Diagnosis not present

## 2021-04-13 DIAGNOSIS — A498 Other bacterial infections of unspecified site: Secondary | ICD-10-CM | POA: Diagnosis not present

## 2021-04-13 DIAGNOSIS — E43 Unspecified severe protein-calorie malnutrition: Secondary | ICD-10-CM | POA: Diagnosis not present

## 2021-04-13 DIAGNOSIS — I70261 Atherosclerosis of native arteries of extremities with gangrene, right leg: Secondary | ICD-10-CM

## 2021-04-13 DIAGNOSIS — I779 Disorder of arteries and arterioles, unspecified: Secondary | ICD-10-CM | POA: Diagnosis not present

## 2021-04-13 DIAGNOSIS — L0390 Cellulitis, unspecified: Secondary | ICD-10-CM

## 2021-04-13 DIAGNOSIS — L97504 Non-pressure chronic ulcer of other part of unspecified foot with necrosis of bone: Secondary | ICD-10-CM

## 2021-04-13 LAB — CULTURE, BLOOD (ROUTINE X 2)
Culture: NO GROWTH
Culture: NO GROWTH
Special Requests: ADEQUATE

## 2021-04-13 LAB — BASIC METABOLIC PANEL
Anion gap: 8 (ref 5–15)
BUN: 55 mg/dL — ABNORMAL HIGH (ref 8–23)
CO2: 22 mmol/L (ref 22–32)
Calcium: 8.5 mg/dL — ABNORMAL LOW (ref 8.9–10.3)
Chloride: 121 mmol/L — ABNORMAL HIGH (ref 98–111)
Creatinine, Ser: 1.93 mg/dL — ABNORMAL HIGH (ref 0.44–1.00)
GFR, Estimated: 27 mL/min — ABNORMAL LOW (ref >60)
Glucose, Bld: 129 mg/dL — ABNORMAL HIGH (ref 70–99)
Potassium: 4.1 mmol/L (ref 3.5–5.1)
Sodium: 151 mmol/L — ABNORMAL HIGH (ref 135–145)

## 2021-04-13 LAB — CBC
HCT: 28.9 % — ABNORMAL LOW (ref 36.0–46.0)
Hemoglobin: 8.3 g/dL — ABNORMAL LOW (ref 12.0–15.0)
MCH: 30.5 pg (ref 26.0–34.0)
MCHC: 28.7 g/dL — ABNORMAL LOW (ref 30.0–36.0)
MCV: 106.3 fL — ABNORMAL HIGH (ref 80.0–100.0)
Platelets: 219 10*3/uL (ref 150–400)
RBC: 2.72 MIL/uL — ABNORMAL LOW (ref 3.87–5.11)
RDW: 21.2 % — ABNORMAL HIGH (ref 11.5–15.5)
WBC: 10.2 10*3/uL (ref 4.0–10.5)
nRBC: 0.5 % — ABNORMAL HIGH (ref 0.0–0.2)

## 2021-04-13 LAB — MAGNESIUM
Magnesium: 2.1 mg/dL (ref 1.7–2.4)
Magnesium: 2 mg/dL (ref 1.7–2.4)

## 2021-04-13 LAB — GLUCOSE, CAPILLARY
Glucose-Capillary: 121 mg/dL — ABNORMAL HIGH (ref 70–99)
Glucose-Capillary: 132 mg/dL — ABNORMAL HIGH (ref 70–99)
Glucose-Capillary: 176 mg/dL — ABNORMAL HIGH (ref 70–99)
Glucose-Capillary: 182 mg/dL — ABNORMAL HIGH (ref 70–99)
Glucose-Capillary: 187 mg/dL — ABNORMAL HIGH (ref 70–99)
Glucose-Capillary: 78 mg/dL (ref 70–99)

## 2021-04-13 LAB — PHOSPHORUS: Phosphorus: 1.1 mg/dL — ABNORMAL LOW (ref 2.5–4.6)

## 2021-04-13 MED ORDER — POTASSIUM PHOSPHATES 15 MMOLE/5ML IV SOLN
15.0000 mmol | Freq: Once | INTRAVENOUS | Status: AC
  Administered 2021-04-13: 15 mmol via INTRAVENOUS
  Filled 2021-04-13 (×2): qty 5

## 2021-04-13 NOTE — Progress Notes (Signed)
[facility]  Date of Admission:  04/01/2021           Reason for visit: Follow up on PsA bacteremia and OM  Current antibiotics: Pip Tazo 4/13--present   Previous antibiotics: Vancomycin 4/7--4/8 Cefepime 4/7--4/13  ASSESSMENT:    1. 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  Blood cultures 04/22/2021 are positive for Pseudomonas aeruginosa.  Repeat blood cultures 04/08/2021 no growth finalized.  2. 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 PCN Allergy: Listed in her chart.  We spoke with her husband who endorses a rash about 40 years ago.  She has tolerated Keflex and other cephalosporins since this reaction.  There is a high likelihood she has outgrown this allergy and we have all agreed to directly challenge her.  She is tolerating Pip tazo thus far.  4. Encephalopathy: Likely multifactorial 5. Acute kidney injury on CKD: Creatinine continues to increase, but hopefully starting to plateau 6. Peripheral vascular disease: Status post multiple stents and ablations previously 7. CREST 8. Pulmonary hypertension on continuous oxygen  PLAN:    . Continue renally dosed piperacillin tazobactam to remove cefepime induced neurotoxicity from the differential although overall suspicion for this is low . Continue wound care . Appreciate palliative care discussions . Appreciate orthopedic evaluation and Dr Jess Barters recommendations . Overall, seems unlikely to heal these wounds without more definitive source control such as amputation, however, patient has previously declined this recommendation and currently not a surgical candidate either.  . Will follow     Principal Problem:   Cellulitis, leg Active Problems:   Peripheral arterial occlusive disease (HCC)   Pulmonary hypertension associated with systemic  disorder (HCC)   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HCC)   Scleroderma (HCC)   Atherosclerosis of native arteries of extremities with gangrene, right leg (HCC)   Chronic respiratory failure with hypoxia (HCC)   ILD (interstitial lung disease) (HCC)   AKI (acute kidney injury) ([place])   PAF (paroxysmal atrial fibrillation) (HCC)   Wound cellulitis   SIRS (systemic inflammatory response syndrome) (HCC)   Pseudomonas aeruginosa infection   Severe protein-calorie malnutrition (HCC)    MEDICATIONS:    Scheduled Meds: . citalopram  20 mg Oral QHS  . clopidogrel  75 mg Oral Daily  . cycloSPORINE  2 drop Both Eyes BID  . feeding supplement  237 mL Oral BID BM  . folic acid  1 mg Oral Daily  . heparin  5,000 Units Subcutaneous Q8H  . hydrocortisone sod succinate (SOLU-CORTEF) inj  50 mg Intravenous Daily  . levothyroxine  50 mcg Oral Q0600  . magnesium oxide  200 mg Oral BID  . multivitamin with minerals  1 tablet Per Tube Daily  . nutrition supplement (JUVEN)  1 packet Per Tube BID WC  . pantoprazole (PROTONIX) IV  40 mg Intravenous Q24H  . vitamin B-12  1,000 mcg Oral Daily   Continuous Infusions: . sodium chloride Stopped (04/12/21 0600)  . dextrose 75 mL/hr at 04/13/21 0843  . dextrose 5 % and 0.9% NaCl Stopped (04/12/21 0935)  . feeding supplement (OSMOLITE 1.2 CAL) 50 mL/hr at 04/13/21 1036  . piperacillin-tazobactam (ZOSYN)  IV 3.375 g (04/13/21 0627)  . potassium PHOSPHATE IVPB (in mmol) 15 mmol (04/13/21 1032)   PRN Meds:.sodium chloride, acetaminophen, HYDROmorphone (DILAUDID) injection, oxyCODONE-acetaminophen  SUBJECTIVE:   24 hour events:  No  acute events noted  Patient remains unchanged, not alert or oriented.  Review of Systems  Unable to perform ROS: Mental acuity      OBJECTIVE:   Blood pressure (!) 131/54, pulse 84, temperature (!) 97.2 F (36.2 C), temperature source Axillary, resp. rate 20, height 5' 7.5" (1.715 m), weight 53.5 kg, SpO2 100 %. Body mass index  is 18.2 kg/m.  Physical Exam Constitutional:      Comments: Cachectic, chronically ill-appearing woman, lying in bed  HENT:     Head: Normocephalic and atraumatic.     Comments: Feeding tube in place. Pulmonary:     Comments: Continues on nasal cannula Skin:    General: Skin is warm and dry.     Findings: No rash.     Comments: Pictures of wounds reviewed in the media tab  Neurological:     Comments: Not alert or oriented.      Lab Results: Lab Results  Component Value Date   WBC 10.2 04/13/2021   HGB 8.3 (L) 04/13/2021   HCT 28.9 (L) 04/13/2021   MCV 106.3 (H) 04/13/2021   PLT 219 04/13/2021    Lab Results  Component Value Date   NA 151 (H) 04/13/2021   K 4.1 04/13/2021   CO2 22 04/13/2021   GLUCOSE 129 (H) 04/13/2021   BUN 55 (H) 04/13/2021   CREATININE 1.93 (H) 04/13/2021   CALCIUM 8.5 (L) 04/13/2021   GFRNONAA 27 (L) 04/13/2021   GFRAA 50 (L) 01/10/2021    Lab Results  Component Value Date   ALT 40 04/12/2021   AST 44 (H) 04/12/2021   ALKPHOS 112 04/12/2021   BILITOT 0.6 04/12/2021       Component Value Date/Time   CRP 16.8 (H) 04/11/2021 0525       Component Value Date/Time   ESRSEDRATE 138 (H) 04/11/2021 0525     I have reviewed the micro and lab results in Epic.  Imaging: CT HEAD WO CONTRAST  Result Date: 04/11/2021 CLINICAL DATA:  Altered mental status. Weakness. EXAM: CT HEAD WITHOUT CONTRAST TECHNIQUE: Contiguous axial images were obtained from the base of the skull through the vertex without intravenous contrast. COMPARISON:  04/27/2021 FINDINGS: Brain: Stable moderately enlarged ventricles and subarachnoid spaces. Mild-to-moderate patchy white matter low density in both cerebral hemispheres without significant change. Stable right cerebellar hemisphere encephalomalacia beneath a craniectomy defect. Stable small left posterior parietal infarct. No intracranial hemorrhage, mass lesion or CT evidence of acute infarction. Vascular: No  hyperdense vessel or unexpected calcification. Skull: Stable right occipital post craniectomy changes. Otherwise, unremarkable. Sinuses/Orbits: Unremarkable. Other: Stable small right frontal scalp mass, possibly representing a lipoma. IMPRESSION: 1. No acute abnormality. 2. 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 Electronically Signed   By: Claudie Revering M.D.   On: 04/11/2021 16:39   DG CHEST PORT 1 VIEW  Result Date: 04/12/2021 CLINICAL DATA:  Nasogastric tube positioning EXAM: PORTABLE CHEST 1 VIEW COMPARISON:  Portable exam 1431 hours compared to earlier study of 12/30 9 hours FINDINGS: Tip of nasogastric tube now projects over stomach. Remainder of chest unchanged since preceding exam. Atherosclerotic calcification aorta. IVC filter noted. Paucity of bowel gas in the upper abdomen. IMPRESSION: Tip of nasogastric tube now projects over stomach. Findings discussed with Maudie Mercury RN on 6E on 04/12/2021 at 1445 hours. Electronically Signed   By: Lavonia Dana M.D.   On: 04/12/2021 14:47  DG CHEST PORT 1 VIEW  Result Date: 04/12/2021 CLINICAL DATA:  Nasogastric tube placement EXAM: PORTABLE CHEST 1 VIEW COMPARISON:  Portable exam 1239 hours compared to 04/09/2021 FINDINGS: Nasogastric tube extends into RIGHT lower lobe bronchus recommend withdrawal and replacement. Patient rotated to the LEFT. Upper normal size of cardiac silhouette. Atherosclerotic calcifications aorta. Chronic accentuation of pulmonary markings. Lungs otherwise clear. No pleural effusion or pneumothorax. Bones demineralized. IMPRESSION: Tip of nasogastric tube is in the RIGHT lower lobe bronchus, recommend withdrawal and replacement. Critical Value/emergent results were called by telephone at the time of interpretation on 04/12/2021 at 1434 hours to [facility] on [place], who verbally  acknowledged these results. Electronically Signed   By: Lavonia Dana M.D.   On: 04/12/2021 14:35   EEG adult  Result Date: 04/11/2021 Jaynie Bream, MD     04/11/2021 11:11 PM ROUTINE EEG REPORT REFERRING PHYSICIAN:  Marylu Lund MD DATE OF STUDY:  04/11/2021 HISTORY: 70yo woman with history of scleroderma, NHL with cerebral involvement s/p chemotherapy in remission, chronic leg wounds with confusion, evaluate for seizure as etiology. DESCRIPTION: Diffuse EMG artifact obscured the majority of the recording.  A slow but continuous background could be seen in the posterior leads with an admixture of high amplitude delta>theta activity between 2-6 hz.  Rhythmic sharply contoured delta at 2 Hz was seen in the posterior leads throughout much of the recording at times with a triphasic morphology- favored to be generalized but most of the anterior leads on the recording were obscured by artifact. No sleep structures were seen. CLASSIFICATION (EEG IMPRESSION): Abnormal Significance III (stuporous) 1. Artifact obscured EEG 2. Technically difficult 3. Continuous slow, generalized 4. Intermittent Rhythmic Slow, Generalized with Sharp activity (GRDA+S) CLINICAL INTERPRETATION: 1) The study was extremely limited by diffuse artifact.  Subtle abnormalities will be missed. 2) There is evidence of a diffuse severe encephalopathy with cortical irritability.  Neurology consultation and repeat EEG are recommended. Colby A. Marvel Plan, MD Neurology and Clinical Neurophysiology     [facility] [phone] pager 04/13/2021, 12:05 PM

## 2021-04-13 NOTE — Progress Notes (Signed)
   04/13/21 0422  Assess: MEWS Score  Temp (!) 97.4 F (36.3 C)  BP (!) 160/96  Pulse Rate (!) 109  Resp 20  Assess: MEWS Score  MEWS Temp 0  MEWS Systolic 0  MEWS Pulse 1  MEWS RR 0  MEWS LOC 1  MEWS Score 2  MEWS Score Color Yellow  Assess: if the MEWS score is Yellow or Red  Were vital signs taken at a resting state? Yes  Focused Assessment No change from prior assessment  Early Detection of Sepsis Score *See Row Information* Low  MEWS guidelines implemented *See Row Information* Yes  Treat  Pain Scale PAINAD  Breathing 0  Negative Vocalization 1  Facial Expression 0  Body Language 0  Consolability 0  PAINAD Score 1  Take Vital Signs  Increase Vital Sign Frequency  Yellow: Q 2hr X 2 then Q 4hr X 2, if remains yellow, continue Q 4hrs  Escalate  MEWS: Escalate Yellow: discuss with charge nurse/RN and consider discussing with provider and RRT  Notify: Charge Nurse/RN  Name of Charge Nurse/RN Notified Renita, RN  Date Charge Nurse/RN Notified 04/13/21  Time Charge Nurse/RN Notified 0503

## 2021-04-13 NOTE — Consult Note (Signed)
ORTHOPAEDIC CONSULTATION  REQUESTING PHYSICIAN: Terrilee Croak, MD  Chief Complaint: Chronic necrotic ulcers bilateral lower extremities right worse than left.  HPI: [name] [name] is a 70 y.o. female who presents with multiple medical problems specifically peripheral vascular disease of both lower extremities.  Patient has undergone arteriogram and endovascular intervention with no further revascularization options available.  Past Medical History:  Diagnosis Date  . Anemia   . Arthritis   . CHF (congestive heart failure) ([place])   . DOE (dyspnea on exertion)   . Epistaxis   . History of chicken pox   . Hypertension   . Hypothyroidism   . Leg ulcer ([place])   . Melanoma ([place]) 1999  . 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[place])   . Non Hodgkin's lymphoma ([place])    Treated with chemo 2010, felt to be cured.  Marland Kitchen PAH (pulmonary artery hypertension) ([place])   . Peripheral arterial occlusive disease ([place])   . Pneumonia 2008  . Pulmonary hypertension ([place])   . Requires supplemental oxygen    2 liters-3liters when moving  . 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 ABDOMINAL AORTOGRAM W/LOWER EXTREMITY Bilateral 10/04/2020   Procedure: ABDOMINAL AORTOGRAM W/LOWER EXTREMITY;  Surgeon: Serafina Mitchell, MD;  Location: [place] [facility];  Service: Cardiovascular;  Laterality: Bilateral;  . ABDOMINAL AORTOGRAM W/LOWER EXTREMITY Bilateral 01/02/2021   Procedure: ABDOMINAL AORTOGRAM W/LOWER EXTREMITY;  Surgeon: Waynetta Sandy, MD;  Location: [place] [facility];  Service: Cardiovascular;  Laterality: Bilateral;  . AMPUTATION Right 10/24/2017   Procedure: RAY resection right index;  Surgeon: Daryll Brod, MD;  Location: [place];  Service: Orthopedics;  Laterality: Right;  Axillary block  . AMPUTATION Right 11/21/2018   Procedure: AMPUTATION RAY RIGHT MIDDLE FINGER;  Surgeon: Daryll Brod, MD;  Location: [place];  Service: Orthopedics;   Laterality: Right;  . AMPUTATION Right 10/22/2019   Procedure: AMPUTATION DIGIT RIGHT RING AND SMALL FINGER, DIGITAL SYMPATHECTOMY RIGHT RADIAL ULNAR ARCH;  Surgeon: Daryll Brod, MD;  Location: [place];  Service: Orthopedics;  Laterality: Right;  AXILARY  . apligraph  12-04-2102,   12-18-2012   bilateral calves   [name]  . APPLICATION OF A-CELL OF EXTREMITY Right 02/09/2021   Procedure: application of primatrix mesh;  Surgeon: Cindra Presume, MD;  Location: [place];  Service: Plastics;  Laterality: Right;  . biopsy on cerebellum    . COLONOSCOPY WITH PROPOFOL Left 10/07/2015   Procedure: COLONOSCOPY WITH PROPOFOL;  Surgeon: [name], MD;  Location: [place];  Service: Endoscopy;  Laterality: Left;  . ENDARTERECTOMY FEMORAL Right 09/14/2016   Procedure: ENDARTERECTOMY FEMORAL RIGHT;  Surgeon: [name], MD;  Location: [facility] OR;  Service: Vascular;  Laterality: Right;  . ENDOVENOUS ABLATION SAPHENOUS VEIN W/ LASER  07-24-2012   right greater saphenous vein by [name], M.D.   . ENDOVENOUS ABLATION SAPHENOUS VEIN W/ LASER  10-16-2012   left greater saphenous vein  by Dr. [name]  . ENDOVENOUS ABLATION SAPHENOUS VEIN W/ LASER Right 09-01-2015   endovenous laser ablation right greater saphenous vein by [name] MD  . ESOPHAGOGASTRODUODENOSCOPY N/A 05/07/2016   Procedure: ESOPHAGOGASTRODUODENOSCOPY (EGD);  Surgeon: [name], MD;  Location: [facility] ENDOSCOPY;  Service: Endoscopy;  Laterality: N/A;  . ESOPHAGOGASTRODUODENOSCOPY N/A 11/23/2020   Procedure: ESOPHAGOGASTRODUODENOSCOPY (EGD);  Surgeon: [name], MD;  Location: [facility];  Service: Endoscopy;  Laterality: N/A;  . ESOPHAGOGASTRODUODENOSCOPY (EGD) WITH PROPOFOL Left 10/07/2015  Procedure: ESOPHAGOGASTRODUODENOSCOPY (EGD) WITH PROPOFOL;  Surgeon: [name], MD;  Location: [place];  Service: Endoscopy;  Laterality: Left;  . GIVENS CAPSULE STUDY N/A 07/31/2016   Procedure: GIVENS CAPSULE STUDY;  Surgeon:  [name], MD;  Location: [facility] ENDOSCOPY;  Service: Endoscopy;  Laterality: N/A;  . GIVENS CAPSULE STUDY N/A 11/23/2020   Procedure: GIVENS CAPSULE STUDY;  Surgeon: [name], MD;  Location: [facility] ENDOSCOPY;  Service: Endoscopy;  Laterality: N/A;  . I & D EXTREMITY Right 02/09/2021   Procedure: Debridement of right lower extremity wound;  Surgeon: Cindra Presume, MD;  Location: [place];  Service: Plastics;  Laterality: Right;  1 hour, please  . IR IVC FILTER PLMT / S&I /IMG GUID/MOD SED  11/22/2020  . LOWER EXTREMITY ANGIOGRAPHY N/A 10/25/2020   Procedure: LOWER EXTREMITY ANGIOGRAPHY - Left;  Surgeon: Serafina Mitchell, MD;  Location: [place] [facility];  Service: Cardiovascular;  Laterality: N/A;  . melonoma removes Right    behind right knee  . OPEN REDUCTION INTERNAL FIXATION (ORIF) DISTAL RADIAL FRACTURE Left 11/24/2019   Procedure: OPEN REDUCTION INTERNAL FIXATION (ORIF) DISTAL RADIAL FRACTURE;  Surgeon: Altamese , MD;  Location: [place];  Service: Orthopedics;  Laterality: Left;  . PATCH ANGIOPLASTY Right 09/14/2016   Procedure: PATCH ANGIOPLASTY RIGHT FEMORAL ARTERY USING HEMASHIELD PLATINUM FINESSE PATCH;  Surgeon: [name], MD;  Location: [place];  Service: Vascular;  Laterality: Right;  . PERIPHERAL VASCULAR BALLOON ANGIOPLASTY Right 10/04/2020   Procedure: PERIPHERAL VASCULAR BALLOON ANGIOPLASTY;  Surgeon: Serafina Mitchell, MD;  Location: [place] [facility];  Service: Cardiovascular;  Laterality: Right;  SFA/PT/AT/PTtrunk  . PERIPHERAL VASCULAR BALLOON ANGIOPLASTY Left 10/25/2020   Procedure: PERIPHERAL VASCULAR BALLOON ANGIOPLASTY;  Surgeon: Serafina Mitchell, MD;  Location: [place] [facility];  Service: Cardiovascular;  Laterality: Left;  anterior tibial and tibioperoneal trunk  . PERIPHERAL VASCULAR CATHETERIZATION N/A 09/07/2016   Procedure: Abdominal Aortogram w/Lower Extremity;  Surgeon: [name], MD;  Location: [place] [facility];  Service: Cardiovascular;  Laterality: N/A;   . RIGHT HEART CATH N/A 04/19/2017   Procedure: Right Heart Cath;  Surgeon: [name], MD;  Location: [place] [facility];  Service: Cardiovascular;  Laterality: N/A;  . RIGHT HEART CATHETERIZATION N/A 11/14/2012   Procedure: RIGHT HEART CATH;  Surgeon: [name], MD;  Location: [facility] CATH LAB;  Service: Cardiovascular;  Laterality: N/A;  . rt little toe removal  10/1998  . stent left thigh  3/11  . TENDON TRANSFER Right 10/24/2017   Procedure: transfer extensor indecus propius/extensor digitorum commomus index to middle ring and small;  Surgeon: Daryll Brod, MD;  Location: [place];  Service: Orthopedics;  Laterality: Right;  . TONSILLECTOMY    . TONSILLECTOMY AND ADENOIDECTOMY    . TUNNELED VENOUS CATHETER PLACEMENT  01/2013  . ULNAR HEAD EXCISION Right 10/24/2017   Procedure: darrach right ulna;  Surgeon: Daryll Brod, MD;  Location: [place];  Service: Orthopedics;  Laterality: Right;  . VEIN LIGATION AND STRIPPING  05/16/2012   Procedure: VEIN LIGATION AND STRIPPING;  Surgeon: [name], MD;  Location: [facility] OR;  Service: Vascular;  Laterality: Right;  Irrigation and Debridement right lower leg, ligation of saphenous vein.   Social History   Socioeconomic History  . Marital status: Married    Spouse name: Not on file  . Number of children: 1  . Years of education: Not on file  . Highest education level: Not on file  Occupational History  .  Occupation: homemaker  Tobacco Use  . Smoking status: Never Smoker  . Smokeless tobacco: Never Used  Vaping Use  . Vaping Use: Never used  Substance and Sexual Activity  . Alcohol use: Yes    Alcohol/week: 7.0 standard drinks    Types: 4 Glasses of wine, 3 Standard drinks or equivalent per week    Comment: weekly  . Drug use: No  . Sexual activity: Not on file  Other Topics Concern  . Not on file  Social History Narrative  . Not on file   Social Determinants of Health   Financial Resource Strain: Not on file  Food Insecurity:  Not on file  Transportation Needs: Not on file  Physical Activity: Not on file  Stress: Not on file  Social Connections: Not on file   Family History  Problem Relation Age of Onset  . Lupus Father   . Lymphoma Father   . Hyperlipidemia Mother   . Cancer Daughter        sarcoma  . Colon cancer Other    - negative except otherwise stated in the family history section Allergies  Allergen Reactions  . Levofloxacin Other (See Comments)    FLU LIKE SYMPTOM   . Sulfa Antibiotics Rash and Other (See Comments)    Face peeled, Severe rash with significant peeling of face. No airway involvement per patient   . Doxycycline Hyclate Swelling    SWELLING REACTION UNSPECIFIED ; "TABLETS ONLY - CAPSULES ARE TOLERATED FINE"  . Penicillins Rash    Has patient had a PCN reaction causing immediate rash, facial/tongue/throat swelling, SOB or lightheadedness with hypotension: no Has patient had a PCN reaction causing severe rash involving mucus membranes or skin necrosis: no Has patient had a PCN reaction that required hospitalization: no Has patient had a PCN reaction occurring within the last 10 years: {no If all of the above answers are "NO", then may proceed with Cephalosporin use.   Prior to Admission medications   Medication Sig Start Date End Date Taking? Authorizing Provider  acetaminophen (TYLENOL) 650 MG CR tablet Take 1,300 mg by mouth every 8 (eight) hours as needed for pain.   Yes [provider]  Biotin 5000 MCG CAPS Take 5,000 mcg by mouth daily.   Yes [provider]  Calcium Carbonate-Vit D-Min (CALCIUM 1200 PO) Take 1 tablet by mouth at bedtime.   Yes [provider]  Cholecalciferol (VITAMIN D) 2000 units CAPS Take 2,000 Units by mouth at bedtime.   Yes [provider]  citalopram (CELEXA) 20 MG tablet Take 1 tablet (20 mg total) by mouth at bedtime. 02/10/21  Yes Regalado, Belkys A, MD  clopidogrel (PLAVIX) 75 MG tablet TAKE 1 TABLET BY MOUTH  EVERY DAY Patient taking differently: Take 75 mg by mouth daily. 01/05/21  Yes Bensimhon, Shaune Pascal, MD  cycloSPORINE (RESTASIS) 0.05 % ophthalmic emulsion Place 2 drops into both eyes 2 (two) times daily.   Yes [provider]  diltiazem (TIAZAC) 180 MG 24 hr capsule Take 180 mg by mouth at bedtime. 02/11/21  Yes [provider]  Ensure Max Protein (ENSURE MAX PROTEIN) LIQD Take 330 mLs (11 oz total) by mouth at bedtime. 02/10/21  Yes Regalado, Belkys A, MD  FERREX 150 150 MG capsule TAKE 1 CAPSULE BY MOUTH TWICE A DAY Patient taking differently: Take 150 mg by mouth 2 (two) times daily. 01/05/21  Yes Baxley, Cresenciano Lick, MD  fish oil-omega-3 fatty acids 1000 MG capsule Take 1,000 mg by mouth  2 (two) times daily.   Yes [provider]  folic acid (FOLVITE) 1 MG tablet TAKE 1 TABLET BY MOUTH EVERY DAY Patient taking differently: Take 1 mg by mouth daily. 01/05/21  Yes Baxley, Cresenciano Lick, MD  gabapentin (NEURONTIN) 100 MG capsule Take 100 mg by mouth every morning. 03/01/21  Yes [provider]  gabapentin (NEURONTIN) 300 MG capsule Take 300 mg by mouth at bedtime. 03/24/21  Yes [provider]  levothyroxine (SYNTHROID) 50 MCG tablet TAKE 1 TABLET BY MOUTH DAILY BEFORE BREAKFAST Patient taking differently: Take 50 mcg by mouth daily before breakfast. 03/05/21  Yes Baxley, Cresenciano Lick, MD  MAGNESIUM ASPARTATE PO Take 150 mg by mouth in the morning and at bedtime.   Yes [provider]  Multiple Vitamin (MULTIVITAMIN WITH MINERALS) TABS tablet Take 1 tablet by mouth daily at 12 noon.   Yes [provider]  nutrition supplement, JUVEN, (JUVEN) PACK Take 1 packet by mouth 2 (two) times daily between meals. 02/10/21  Yes Regalado, Belkys A, MD  ondansetron (ZOFRAN) 4 MG tablet TAKE 1 TABLET BY MOUTH EVERY 8 HOURS AS NEEDED FOR NAUSEA AND VOMITING Patient taking differently: Take 4 mg by mouth every 8 (eight) hours as needed for nausea or vomiting. 03/05/21  Yes Baxley,  Cresenciano Lick, MD  OPSUMIT 10 MG tablet Take 1 tablet (34m) by mouth daily. 04/29/20  Yes Bensimhon, DShaune Pascal MD  OXYGEN Inhale 2-3 L into the lungs continuous.    Yes [provider]  pantoprazole (PROTONIX) 40 MG tablet Take 1 tablet (40 mg total) by mouth 2 (two) times daily. 10/05/20  Yes Baxley, MCresenciano Lick MD  potassium chloride 20 MEQ/15ML (10%) SOLN TAKE 2 TEASPOONFUL (DILUTED IN WATER) ONCE DAILY Patient taking differently: Take 13.3 mEq by mouth See admin instructions. Dilute in water once daily 08/02/20  Yes Baxley, MCresenciano Lick MD  predniSONE (DELTASONE) 5 MG tablet Take 5 mg by mouth daily. 04/04/20  Yes [provider]  rosuvastatin (CRESTOR) 5 MG tablet Take 1 tablet every Monday, Wednesday, and Friday. Patient taking differently: Take 5 mg by mouth every Monday, Wednesday, and Friday. Take 1 tablet every Monday, Wednesday, and Friday. 08/29/20  Yes Bensimhon, DShaune Pascal MD  spironolactone (ALDACTONE) 25 MG tablet Take 25 mg by mouth daily. 03/14/21  Yes [provider]  torsemide (DEMADEX) 20 MG tablet TAKE 2 TABLETS BY MOUTH IN THE AM AND 1 TABLET BY MOUTH IN THE PM. Patient taking differently: Take 20-40 mg by mouth See admin instructions. Take 2 tablets by mouth every morning, and 1 tablet every evening 03/07/21  Yes Bensimhon, DShaune Pascal MD  triamcinolone ointment (KENALOG) 0.1 % Apply 1 application topically daily as needed. Each dressing change 04/02/21  Yes [provider]  valACYclovir (VALTREX) 500 MG tablet Take 500 mg by mouth as needed (cold sore).   Yes [provider]  vitamin B-12 (CYANOCOBALAMIN) 1000 MCG tablet Take 1,000 mcg by mouth daily.   Yes [provider]  HYDROcodone-acetaminophen (NORCO) 5-325 MG tablet Take 1 tablet by mouth as needed for severe pain. (Do not exceed 1 tablet every 8 hours) Patient not taking: No sig reported 02/14/21   YPhoebe SharpsC, PA-C  loperamide (IMODIUM) 2 MG capsule Take 2-4 mg by mouth 4 (four) times  daily as needed for diarrhea or loose stools.    [provider]  sildenafil (REVATIO) 20 MG tablet Take 3 tablets (60 mg total) by mouth 3 (three) times daily. Patient not  taking: No sig reported 02/02/21   Bensimhon, Shaune Pascal, MD   CT HEAD WO CONTRAST  Result Date: 04/11/2021 CLINICAL DATA:  Altered mental status. Weakness. EXAM: CT HEAD WITHOUT CONTRAST TECHNIQUE: Contiguous axial images were obtained from the base of the skull through the vertex without intravenous contrast. COMPARISON:  04/02/2021 FINDINGS: Brain: Stable moderately enlarged ventricles and subarachnoid spaces. Mild-to-moderate patchy white matter low density in both cerebral hemispheres without significant change. Stable right cerebellar hemisphere encephalomalacia beneath a craniectomy defect. Stable small left posterior parietal infarct. No intracranial hemorrhage, mass lesion or CT evidence of acute infarction. Vascular: No hyperdense vessel or unexpected calcification. Skull: Stable right occipital post craniectomy changes. Otherwise, unremarkable. Sinuses/Orbits: Unremarkable. Other: Stable small right frontal scalp mass, possibly representing a lipoma. IMPRESSION: 1. No acute abnormality. 2. 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 Electronically Signed   By: Claudie Revering M.D.   On: 04/11/2021 16:39   DG CHEST PORT 1 VIEW  Result Date: 04/12/2021 CLINICAL DATA:  Nasogastric tube positioning EXAM: PORTABLE CHEST 1 VIEW COMPARISON:  Portable exam 1431 hours compared to earlier study of 12/30 9 hours FINDINGS: Tip of nasogastric tube now projects over stomach. Remainder of chest unchanged since preceding exam. Atherosclerotic calcification aorta. IVC filter noted. Paucity of bowel gas in the upper abdomen. IMPRESSION: Tip of nasogastric tube now projects over stomach.  Findings discussed with Maudie Mercury RN on 6E on 04/12/2021 at 1445 hours. Electronically Signed   By: Lavonia Dana M.D.   On: 04/12/2021 14:47   DG CHEST PORT 1 VIEW  Result Date: 04/12/2021 CLINICAL DATA:  Nasogastric tube placement EXAM: PORTABLE CHEST 1 VIEW COMPARISON:  Portable exam 1239 hours compared to 04/15/2021 FINDINGS: Nasogastric tube extends into RIGHT lower lobe bronchus recommend withdrawal and replacement. Patient rotated to the LEFT. Upper normal size of cardiac silhouette. Atherosclerotic calcifications aorta. Chronic accentuation of pulmonary markings. Lungs otherwise clear. No pleural effusion or pneumothorax. Bones demineralized. IMPRESSION: Tip of nasogastric tube is in the RIGHT lower lobe bronchus, recommend withdrawal and replacement. Critical Value/emergent results were called by telephone at the time of interpretation on 04/12/2021 at 1434 hours to [facility] on [place], who verbally acknowledged these results. Electronically Signed   By: Lavonia Dana M.D.   On: 04/12/2021 14:35   EEG adult  Result Date: 04/11/2021 Jaynie Bream, MD     04/11/2021 11:11 PM ROUTINE EEG REPORT REFERRING PHYSICIAN:  Marylu Lund MD DATE OF STUDY:  04/11/2021 HISTORY: 70yo woman with history of scleroderma, NHL with cerebral involvement s/p chemotherapy in remission, chronic leg wounds with confusion, evaluate for seizure as etiology. DESCRIPTION: Diffuse EMG artifact obscured the majority of the recording.  A slow but continuous background could be seen in the posterior leads with an admixture of high amplitude delta>theta activity between 2-6 hz.  Rhythmic sharply contoured delta at 2 Hz was seen in the posterior leads throughout much of the recording at times with a triphasic morphology- favored to be generalized but most of the anterior leads on the recording were obscured by artifact. No sleep structures were seen. CLASSIFICATION (EEG IMPRESSION): Abnormal Significance III (stuporous) 1. Artifact obscured  EEG 2. Technically difficult 3. Continuous slow, generalized 4. Intermittent Rhythmic Slow, Generalized with Sharp activity (GRDA+S) CLINICAL INTERPRETATION: 1) The study was extremely limited by diffuse artifact.  Subtle abnormalities will be missed. 2) There is evidence of a diffuse  severe encephalopathy with cortical irritability.  Neurology consultation and repeat EEG are recommended. Colby A. Marvel Plan, MD Neurology and Clinical Neurophysiology  - pertinent xrays, CT, MRI studies were reviewed and independently interpreted  Positive ROS: All other systems have been reviewed and were otherwise negative with the exception of those mentioned in the HPI and as above.  Physical Exam: General: Patient is not alert she does not follow commands her eyes are fixed. Psychiatric: Patient is not competent for consent . Lymphatic: No axillary or cervical lymphadenopathy Cardiovascular: Patient has peripheral vascular disease with venous and lymphatic insufficiency. Respiratory: No cyanosis, no use of accessory musculature GI: No organomegaly, abdomen is soft and non-tender    Images:  _0 @  Labs:  Lab Results  Component Value Date   ESRSEDRATE 138 (H) 04/11/2021   ESRSEDRATE >140 (H) 01/26/2021   ESRSEDRATE 34 (H) 08/10/2011   CRP 16.8 (H) 04/11/2021   CRP 60.3 (H) 02/20/2021   CRP 2.6 (H) 01/26/2021   REPTSTATUS PENDING 04/08/2021   REPTSTATUS PENDING 04/08/2021   GRAMSTAIN  12/20/2020    NO WBC SEEN ABUNDANT GRAM NEGATIVE RODS RARE GRAM POSITIVE COCCI    CULT  04/08/2021    NO GROWTH 4 DAYS Performed at [facility], [place] [address], [place], [place] [address]    CULT  04/08/2021    NO GROWTH 4 DAYS Performed at [place] [address], [place], [place] [address]    LABORGA PSEUDOMONAS AERUGINOSA 04/08/2021    Lab Results  Component Value Date   ALBUMIN 2.2 (L) 04/12/2021   ALBUMIN 2.1 (L) 04/11/2021   ALBUMIN 2.2 (L) 04/10/2021    No results  found for: CBC  Neurologic: Patient does not have protective sensation bilateral lower extremities.   MUSCULOSKELETAL:   Skin: Examination patient's legs are wrapped with a [place] compressive wrap.  Review of the photographs shows extensive necrotic skin changes involving the right foot and calf, with smaller necrotic ulcers of the left ankle and extensive necrosis of the right forefoot.  Radiographs show destructive bony changes in the foot consistent with chronic osteomyelitis.  Review of patient's previous arteriogram studies demonstrates no further intervention available for revascularization to her lower extremities.  Patient's albumin is 2.2 she is on nutritional supplements.  Patient's sed rate has been approximately 140 with a C-reactive protein as high as 60 currently 16.8 with sepsis secondary to Pseudomonas.  Assessment: Gangrenous changes right leg and left foot, with venous and lymphatic and arterial insufficiency bilateral lower extremities.  Plan: Patient is currently not a candidate for any surgical intervention.  If patient recovers from this sepsis and encephalopathy and wishes to proceed with surgical intervention her best option would be a right above-the-knee amputation and a left below the knee amputation.  Thank you for the consult and the opportunity to see [name], [place] [phone] 7:46 AM

## 2021-04-13 NOTE — Progress Notes (Signed)
PROGRESS NOTE  [name] [name]  DOB: 1951/10/15  PCP: [name], MD TDS:[id number]  DOA: 04/05/2021  LOS: 7 days   Chief Complaint  Patient presents with  . Altered Mental Status    Brief narrative: [name] [name] is a 70 y.o. female with PMH significant for scleroderma diagnosed in 1968, crest syndrome, pulmonary HTN, on continuous oxygen between 2-3L [place], non Hodgkin lymphomaof the brains/p chemotherapy in remission, PAD, severe PVD s/p multiple stents and ablations, CHF, DVT s/p IVC filter-not on anticoagulation due to GI bleed, chronic anemiarequiring IV iron transfusion, 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 Patient was brought from home to the ED on 04/14/2021 for altered mental status and worsening of chronic leg wounds.  During the course of this hospitalization, patient was noted to have Pseudomonas aeruginosa bacteremia likely due to osteomyelitis of her foot.  Patient continued to decline amputation like she did in the past. ID, palliative consultations were obtained. Patient hospitalized and is prolonged because of persistently altered mental status change. See below for details  Subjective: Patient was seen and examined this morning.   Lying on bed.  Nonpurposeful movements of her head.  Tries to open eyes on sternal rub but not able to.  Has NG tube feeding ongoing.  Husband at bedside. I had a long conversation with him.  I relayed him the recommendation from Dr. Sharol Given.  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  In the past, she refused amputation.   -Presented with worsening wound, leukocytosis to 23,000, elevated lactic acid to 2.4. -4/8, MRI left foot  showed abnormal edema distally in the prox phalanx of the small toe suspicious for active osteomyelitis.  -Blood culture from 4/7 grew Pseudomonas aeruginosa.  Repeat blood culture on 4/9 negative -Currently on renally dosed Zosyn. -Since patient has been traditionally refusing amputation, she may need prolonged antibiotic course.  -Wound care consult obtained.  But the extent of the wound is beyond the expertise of wound care.  -4/14, consultation seen by Dr. Sharol Given.  For the extent of her wound, ideally she would need amputation of both her lower extremities but because of her frailty, she is not a candidate for any surgical intervention at this time.  -White count and lactic acid level improved to normal. Recent Labs  Lab 04/18/2021 2341 04/07/21 0006 04/07/21 0204 04/07/21 0553 04/08/21 0523 04/09/21 0615 04/09/21 2202 04/10/21 0648 04/11/21 0525 04/12/21 0553 04/13/21 0541  WBC  --    < >  --   --    < > 17.7*  --  15.1* 13.9* 9.9 10.2  LATICACIDVEN 2.4*  --  2.4* 1.8  --   --  1.8  --   --   --   --    < > = values in this interval not displayed.   Acute metabolic encephalopathy -Unclear etiology.  Probably multifactorial: Dehydration, hypernatremia, infection, scleroderma, history of craniectomy -4/12 CT head did not show any acute abnormality. 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 -On dextrose drip because of hypernatremia AKI on CKD 3B -Creatinine at baseline less than 1.4.  Currently  elevated and continues to trend up despite hydration and nutrition -Monitor on IV fluid. Recent Labs    03/03/21 1522 04/08/2021 1539 04/07/21 0006 04/08/21 0523 04/09/21 0615 04/09/21 2202 04/10/21 8676 04/11/21 0525 04/12/21 0553 04/13/21 0541  BUN 41* 49* 44* 45* 46* 44* 45* 39* 43* 55*  CREATININE  1.31* 1.27* 1.16* 1.24* 1.14* 1.44* 1.50* 1.75* 1.86* 1.93*   Hypernatremia -Sodium level is running high because of poor oral intake -4/13, IV fluid was switched to dextrose drip. Currently running at 75 mL/h.  Sodium level improved from 155 yesterday to 151 today.  Continue to monitor sodium level. Recent Labs  Lab 04/07/21 0006 04/08/21 0523 04/09/21 0615 04/09/21 2202 04/10/21 1950 04/11/21 0525 04/12/21 0553 04/13/21 0541  NA 130* 134* 135 142 140 148* 155* 151*   Hypokalemia/hypophosphatemia -Labs this morning with phosphorus level significantly low at 1.1.  Discussed with pharmacy.  Replacement ordered.  Recheck tomorrow. Recent Labs  Lab 04/09/21 2202 04/10/21 0648 04/11/21 0525 04/12/21 0553 04/12/21 1141 04/12/21 1730 04/13/21 0541  K 4.1 3.4* 3.8 2.9*  --   --  4.1  MG  --   --   --   --  2.3 2.4 2.1  PHOS  --   --   --   --  1.7* 2.1* 1.1*   Anemia of chronic disease  hx of GI bleed -Baseline hemoglobin between 9-10. -No active bleeding but hemoglobin this admission is running between 8-9.   -Continue monitor Recent Labs    07/08/20 1134 10/04/20 0629 11/21/20 1603 11/22/20 0310 01/10/21 0933 01/24/21 0000 01/26/21 1508 01/26/21 1509 02/05/21 1608 02/08/21 0530 02/09/21 0500 03/03/21 1521 03/03/21 1522 04/11/2021 1539 04/09/21 0615 04/10/21 0648 04/11/21 0525 04/12/21 0553 04/13/21 0541  HGB 10.0*   < > 5.3*   < > 8.6*   < > 9.3*  --    < >  --    < >  --  10.1*   < > 8.2* 7.8* 8.0* 8.6* 8.3*  MCV 100.0   < > 113.1*   < > 95.1   < > 101.0*  --    < >  --    < >  --  96.4   < > 96.7 100.0 105.1* 104.2* 106.3*  VITAMINB12  --   --  742  --  675  --  865  --   --  1,429*  --   --   --   --   --   --   --   --   --   FOLATE  --   --  106.8  --  21.3  --   --  94.9  --  58.1  --   --   --   --   --   --   --   --   --   FERRITIN  --   --  195  --  179  --  270  --   --   --   --   --  786*  --   --   --   --   --   --   TIBC  --   --  376  --  269   --  270  --   --   --   --   --  198*  --   --   --   --   --   --   IRON 95  --  39  --  41*  --  29*  --   --   --   --   --  21*  --   --   --   --   --   --   RETICCTPCT  --   --  10.5*  --   --   --  2.4  --   --   --   --  1.3  --   --   --   --   --   --   --    < > = values in this interval not displayed.   Scleroderma Crest syndrome  interstitial lung disease with chronic hypoxemia Pulmonary hypertension -Chronically on prednisone.  Currently on IV hydrocortisone 50 mg daily.   -On Revatio and macitentan for pulmonary hypertension -On 2 to 3 L oxygen by nasal cannula at baseline -Follows with outpatient pulmonology  PAD -Plavix as tolerated  Paroxysmal atrial fibrillation -Not on anticoagulation due to previous GI bleed  History of DVT -Has IVC filter in place -Seems to be stable at this time  Hyperlipidemia -Off statin due to myalgia  Goals of care -Patient's overall clinical status seems to be worsening.  Palliative care consultation was obtained.  Husband chose to keep her full code.  -Yesterday and today, I had a long conversation with him about patient's worsening health condition.  Patient is at high risk of in-hospital death which I clearly stated to him.  My recommendation would be to consider comfort care at this time.  Has been remains hopeful especially after she was started on NG feeding yesterday he wants to wait next few days before deciding differently.    Mobility: may encourage ambulation after mental status improves Code Status:   Code Status: Full Code  Nutritional status: Body mass index is 18.2 kg/m. Nutrition Problem: Severe Malnutrition Etiology: chronic illness,wound healing Signs/Symptoms: severe fat depletion,severe muscle depletion Diet Order            Diet Heart Room service appropriate? Yes; Fluid consistency: Thin  Diet effective now                 DVT prophylaxis: heparin injection 5,000 Units Start: 04/07/21 1500    Antimicrobials:  IV Zosyn Fluid: Dextrose drip at 75 mL/h Consultants: Palliative care, ID Family Communication:  Husband at bedside  Status is: Inpatient  Remains inpatient appropriate because -continue to remain altered  Dispo: The patient is from: Home              Anticipated d/c is to: Home versus SNF              Patient currently is not medically stable to d/c.   Difficult to place patient No       Infusions:  . sodium chloride Stopped (04/12/21 0600)  . dextrose 75 mL/hr at 04/13/21 0843  . dextrose 5 % and 0.9% NaCl Stopped (04/12/21 0935)  . feeding supplement (OSMOLITE 1.2 CAL) 50 mL/hr at 04/13/21 1036  . piperacillin-tazobactam (ZOSYN)  IV 3.375 g (04/13/21 1355)    Scheduled Meds: . citalopram  20 mg Oral QHS  . clopidogrel  75 mg Oral Daily  . cycloSPORINE  2 drop Both Eyes BID  . feeding supplement  237 mL Oral BID BM  . folic acid  1 mg Oral Daily  . heparin  5,000 Units Subcutaneous Q8H  . hydrocortisone sod succinate (SOLU-CORTEF) inj  50 mg Intravenous Daily  . levothyroxine  50 mcg Oral Q0600  .  magnesium oxide  200 mg Oral BID  . multivitamin with minerals  1 tablet Per Tube Daily  . nutrition supplement (JUVEN)  1 packet Per Tube BID WC  . pantoprazole (PROTONIX) IV  40 mg Intravenous Q24H  . vitamin B-12  1,000 mcg Oral Daily    Antimicrobials: Anti-infectives (From admission, onward)   Start     Dose/Rate Route Frequency Ordered Stop   04/13/21 0600  piperacillin-tazobactam (ZOSYN) IVPB 3.375 g        3.375 g 12.5 mL/hr over 240 Minutes Intravenous Every 8 hours 04/12/21 1055     04/12/21 0600  ceFEPIme (MAXIPIME) 2 g in sodium chloride 0.9 % 100 mL IVPB  Status:  Discontinued        2 g 200 mL/hr over 30 Minutes Intravenous Every 24 hours 04/11/21 0712 04/12/21 1055   04/08/21 0600  vancomycin (VANCOREADY) IVPB 1000 mg/200 mL  Status:  Discontinued        1,000 mg 200 mL/hr over 60 Minutes Intravenous Every 36 hours 04/07/21 0059  04/08/21 0828   04/07/21 0600  ceFEPIme (MAXIPIME) 2 g in sodium chloride 0.9 % 100 mL IVPB  Status:  Discontinued        2 g 200 mL/hr over 30 Minutes Intravenous Every 12 hours 04/07/21 0059 04/11/21 0712   04/02/2021 1715  vancomycin (VANCOCIN) IVPB 1000 mg/200 mL premix        1,000 mg 200 mL/hr over 60 Minutes Intravenous  Once 04/16/2021 1711 04/07/2021 2045   04/10/2021 1715  ceFEPIme (MAXIPIME) 2 g in sodium chloride 0.9 % 100 mL IVPB        2 g 200 mL/hr over 30 Minutes Intravenous  Once 04/01/2021 1711 04/28/2021 1800      PRN meds: sodium chloride, acetaminophen, HYDROmorphone (DILAUDID) injection, oxyCODONE-acetaminophen   Objective: Vitals:   04/13/21 0843 04/13/21 1051  BP: (!) 133/58 (!) 131/54  Pulse: 77 84  Resp:  20  Temp: (!) 97.2 F (36.2 C)   SpO2:  100%    Intake/Output Summary (Last 24 hours) at 04/13/2021 1711 Last data filed at 04/13/2021 0639 Gross per 24 hour  Intake 1475.1 ml  Output 300 ml  Net 1175.1 ml   Filed Weights   03/31/2021 1502 04/03/2021 2300 04/13/21 0500  Weight: 54 kg 53.9 kg 53.5 kg   Weight change:  Body mass index is 18.2 kg/m.   Physical Exam: General exam: Elderly Caucasian female.  Frail.  Chronically sick looking.  Tries to open eyes and sternal rub.  Making involuntary movements  skin: No rashes, lesions or ulcers. HEENT: Atraumatic, normocephalic, no obvious bleeding Lungs: Clear to auscultation bilaterally CVS: Regular rate and rhythm, no murmur GI/Abd soft, nontender, nondistended, bowel sound present CNS: Altered, tries to open eyes on sternal rub, nonpurposeful movement of head noted. Psychiatry: Unable to examine because of altered mental status Extremities: Chronic wound on the legs bandaged.  No pedal edema.  Data Review: I have personally reviewed the laboratory data and studies available.  Recent Labs  Lab 04/09/21 0615 04/10/21 0648 04/11/21 0525 04/12/21 0553 04/13/21 0541  WBC 17.7* 15.1* 13.9* 9.9 10.2  HGB  8.2* 7.8* 8.0* 8.6* 8.3*  HCT 26.3* 25.7* 27.0* 29.9* 28.9*  MCV 96.7 100.0 105.1* 104.2* 106.3*  PLT 408* 334 312 282 219   Recent Labs  Lab 04/09/21 2202 04/10/21 0648 04/11/21 0525 04/12/21 0553 04/12/21 1141 04/12/21 1730 04/13/21 0541  NA 142 140 148* 155*  --   --  151*  K 4.1 3.4* 3.8 2.9*  --   --  4.1  CL 101 104 113* 121*  --   --  121*  CO2 16* 14* 16* 21*  --   --  22  GLUCOSE 75 77 103* 139*  --   --  129*  BUN 44* 45* 39* 43*  --   --  55*  CREATININE 1.44* 1.50* 1.75* 1.86*  --   --  1.93*  CALCIUM 8.8* 8.4* 8.4* 8.2*  --   --  8.5*  MG  --   --   --   --  2.3 2.4 2.1  PHOS  --   --   --   --  1.7* 2.1* 1.1*    F/u labs ordered Unresulted Labs (From admission, onward)          Start     Ordered   04/14/21 0500  Phosphorus  (ICU Tube Feeding: PEPuP )  Tomorrow morning,   R       Question:  Specimen collection method  Answer:  Lab=Lab collect   04/13/21 1017   04/13/21 5974  Basic metabolic panel  Daily,   R     Question:  Specimen collection method  Answer:  Lab=Lab collect   04/12/21 1301   04/13/21 0500  CBC  Daily,   R     Question:  Specimen collection method  Answer:  Lab=Lab collect   04/12/21 1301   04/12/21 1129  Magnesium  (ICU Tube Feeding: PEPuP )  5A & 5P,   R     Question:  Specimen collection method  Answer:  Lab=Lab collect   04/12/21 1128   Unscheduled  Occult blood card to lab, stool RN will collect  As needed,   R     Question:  Specimen to be collected by:  Answer:  RN will collect   04/07/21 0030          Signed, Terrilee Croak, MD [facility] 04/13/2021

## 2021-04-13 NOTE — Care Management Important Message (Signed)
Important Message  Patient Details IM Letter given to the Patient Name: [name] [name] MRN: [id number] Date of Birth: December 18, 1951   Medicare Important Message Given:  Yes     Kerin Salen 04/13/2021, 8:40 AM

## 2021-04-14 ENCOUNTER — Inpatient Hospital Stay (HOSPITAL_COMMUNITY): Payer: Medicare Other

## 2021-04-14 DIAGNOSIS — B965 Pseudomonas (aeruginosa) (mallei) (pseudomallei) as the cause of diseases classified elsewhere: Secondary | ICD-10-CM | POA: Diagnosis not present

## 2021-04-14 DIAGNOSIS — Z7189 Other specified counseling: Secondary | ICD-10-CM | POA: Diagnosis not present

## 2021-04-14 DIAGNOSIS — R7881 Bacteremia: Secondary | ICD-10-CM | POA: Diagnosis not present

## 2021-04-14 DIAGNOSIS — N179 Acute kidney failure, unspecified: Secondary | ICD-10-CM | POA: Diagnosis not present

## 2021-04-14 DIAGNOSIS — R531 Weakness: Secondary | ICD-10-CM | POA: Diagnosis not present

## 2021-04-14 DIAGNOSIS — Z515 Encounter for palliative care: Secondary | ICD-10-CM

## 2021-04-14 DIAGNOSIS — M86672 Other chronic osteomyelitis, left ankle and foot: Secondary | ICD-10-CM | POA: Diagnosis not present

## 2021-04-14 DIAGNOSIS — L03115 Cellulitis of right lower limb: Secondary | ICD-10-CM | POA: Diagnosis not present

## 2021-04-14 LAB — BLOOD GAS, ARTERIAL
Acid-Base Excess: 0.1 mmol/L (ref 0.0–2.0)
Bicarbonate: 23 mmol/L (ref 20.0–28.0)
Drawn by: 422461
O2 Content: 3 L/min
O2 Saturation: 96 %
Patient temperature: 97.8
pCO2 arterial: 31.4 mmHg — ABNORMAL LOW (ref 32.0–48.0)
pH, Arterial: 7.476 — ABNORMAL HIGH (ref 7.350–7.450)
pO2, Arterial: 73.6 mmHg — ABNORMAL LOW (ref 83.0–108.0)

## 2021-04-14 LAB — CBC
HCT: 28.3 % — ABNORMAL LOW (ref 36.0–46.0)
Hemoglobin: 8.1 g/dL — ABNORMAL LOW (ref 12.0–15.0)
MCH: 30 pg (ref 26.0–34.0)
MCHC: 28.6 g/dL — ABNORMAL LOW (ref 30.0–36.0)
MCV: 104.8 fL — ABNORMAL HIGH (ref 80.0–100.0)
Platelets: 193 10*3/uL (ref 150–400)
RBC: 2.7 MIL/uL — ABNORMAL LOW (ref 3.87–5.11)
RDW: 21.2 % — ABNORMAL HIGH (ref 11.5–15.5)
WBC: 11.2 10*3/uL — ABNORMAL HIGH (ref 4.0–10.5)
nRBC: 0.2 % (ref 0.0–0.2)

## 2021-04-14 LAB — COMPREHENSIVE METABOLIC PANEL
ALT: 42 U/L (ref 0–44)
AST: 50 U/L — ABNORMAL HIGH (ref 15–41)
Albumin: 1.9 g/dL — ABNORMAL LOW (ref 3.5–5.0)
Alkaline Phosphatase: 97 U/L (ref 38–126)
Anion gap: 11 (ref 5–15)
BUN: 74 mg/dL — ABNORMAL HIGH (ref 8–23)
CO2: 20 mmol/L — ABNORMAL LOW (ref 22–32)
Calcium: 7.8 mg/dL — ABNORMAL LOW (ref 8.9–10.3)
Chloride: 110 mmol/L (ref 98–111)
Creatinine, Ser: 1.74 mg/dL — ABNORMAL HIGH (ref 0.44–1.00)
GFR, Estimated: 31 mL/min — ABNORMAL LOW (ref >60)
Glucose, Bld: 204 mg/dL — ABNORMAL HIGH (ref 70–99)
Potassium: 4.3 mmol/L (ref 3.5–5.1)
Sodium: 141 mmol/L (ref 135–145)
Total Bilirubin: 0.5 mg/dL (ref 0.3–1.2)
Total Protein: 5.3 g/dL — ABNORMAL LOW (ref 6.5–8.1)

## 2021-04-14 LAB — BASIC METABOLIC PANEL
Anion gap: 6 (ref 5–15)
BUN: 76 mg/dL — ABNORMAL HIGH (ref 8–23)
CO2: 24 mmol/L (ref 22–32)
Calcium: 8.1 mg/dL — ABNORMAL LOW (ref 8.9–10.3)
Chloride: 114 mmol/L — ABNORMAL HIGH (ref 98–111)
Creatinine, Ser: 1.91 mg/dL — ABNORMAL HIGH (ref 0.44–1.00)
GFR, Estimated: 28 mL/min — ABNORMAL LOW (ref >60)
Glucose, Bld: 156 mg/dL — ABNORMAL HIGH (ref 70–99)
Potassium: 4 mmol/L (ref 3.5–5.1)
Sodium: 144 mmol/L (ref 135–145)

## 2021-04-14 LAB — GLUCOSE, CAPILLARY
Glucose-Capillary: 130 mg/dL — ABNORMAL HIGH (ref 70–99)
Glucose-Capillary: 140 mg/dL — ABNORMAL HIGH (ref 70–99)
Glucose-Capillary: 147 mg/dL — ABNORMAL HIGH (ref 70–99)
Glucose-Capillary: 150 mg/dL — ABNORMAL HIGH (ref 70–99)
Glucose-Capillary: 202 mg/dL — ABNORMAL HIGH (ref 70–99)
Glucose-Capillary: 214 mg/dL — ABNORMAL HIGH (ref 70–99)

## 2021-04-14 LAB — PHOSPHORUS: Phosphorus: 1.3 mg/dL — ABNORMAL LOW (ref 2.5–4.6)

## 2021-04-14 LAB — AMMONIA: Ammonia: 20 umol/L (ref 9–35)

## 2021-04-14 LAB — LACTIC ACID, PLASMA: Lactic Acid, Venous: 1.9 mmol/L (ref 0.5–1.9)

## 2021-04-14 MED ORDER — CITALOPRAM HYDROBROMIDE 20 MG PO TABS
20.0000 mg | ORAL_TABLET | Freq: Every day | ORAL | Status: DC
  Administered 2021-04-15 (×2): 20 mg
  Filled 2021-04-14 (×2): qty 1

## 2021-04-14 MED ORDER — POTASSIUM PHOSPHATES 15 MMOLE/5ML IV SOLN
15.0000 mmol | Freq: Once | INTRAVENOUS | Status: AC
  Administered 2021-04-14: 15 mmol via INTRAVENOUS
  Filled 2021-04-14: qty 5

## 2021-04-14 MED ORDER — MAGNESIUM OXIDE 400 (241.3 MG) MG PO TABS
200.0000 mg | ORAL_TABLET | Freq: Two times a day (BID) | ORAL | Status: DC
  Administered 2021-04-15 – 2021-04-16 (×4): 200 mg
  Filled 2021-04-14 (×4): qty 1

## 2021-04-14 MED ORDER — CLOPIDOGREL BISULFATE 75 MG PO TABS
75.0000 mg | ORAL_TABLET | Freq: Every day | ORAL | Status: DC
  Administered 2021-04-15 – 2021-04-16 (×2): 75 mg
  Filled 2021-04-14 (×2): qty 1

## 2021-04-14 MED ORDER — OXYCODONE-ACETAMINOPHEN 5-325 MG PO TABS: 1.0000 | ORAL_TABLET | ORAL | Status: DC | PRN

## 2021-04-14 MED ORDER — LEVOTHYROXINE SODIUM 50 MCG PO TABS
50.0000 ug | ORAL_TABLET | Freq: Every day | ORAL | Status: DC
  Administered 2021-04-15 – 2021-04-16 (×2): 50 ug
  Filled 2021-04-14 (×2): qty 1

## 2021-04-14 MED ORDER — ORAL CARE MOUTH RINSE
15.0000 mL | Freq: Two times a day (BID) | OROMUCOSAL | Status: DC
  Administered 2021-04-15 – 2021-04-16 (×6): 15 mL via OROMUCOSAL

## 2021-04-14 MED ORDER — FOLIC ACID 1 MG PO TABS
1.0000 mg | ORAL_TABLET | Freq: Every day | ORAL | Status: DC
  Administered 2021-04-15 – 2021-04-16 (×2): 1 mg
  Filled 2021-04-14 (×2): qty 1

## 2021-04-14 MED ORDER — CHLORHEXIDINE GLUCONATE CLOTH 2 % EX PADS
6.0000 | MEDICATED_PAD | Freq: Every day | CUTANEOUS | Status: DC
  Administered 2021-04-14 – 2021-04-16 (×3): 6 via TOPICAL

## 2021-04-14 MED ORDER — VITAMIN B-12 1000 MCG PO TABS
1000.0000 ug | ORAL_TABLET | Freq: Every day | ORAL | Status: DC
  Administered 2021-04-15 – 2021-04-16 (×2): 1000 ug
  Filled 2021-04-14 (×2): qty 1

## 2021-04-14 MED ORDER — ACETAMINOPHEN 325 MG PO TABS: 650.0000 mg | ORAL_TABLET | Freq: Four times a day (QID) | ORAL | Status: DC | PRN

## 2021-04-14 NOTE — Progress Notes (Signed)
Patient transferred to room 1229 after rapid and NP assessment. Report given to oncoming RN.

## 2021-04-14 NOTE — Progress Notes (Signed)
Daily Progress Note   Patient Name: [name] [name]       Date: 04/14/2021 DOB: [birth year]  Age: 70 y.o. MRN#: [id number] Attending Physician: Bonnell Public, MD Primary Care Physician: [name], MD Admit Date: 04/09/2021  Reason for Consultation/Follow-up: Establishing goals of care  Subjective: Awake today, tracks me in the room, doesn't verbalize, but has been nodding appropriately earlier today, as per nursing staff in the room, husband at bedside.   Length of Stay: 8  Current Medications: Scheduled Meds:  . citalopram  20 mg Oral QHS  . clopidogrel  75 mg Oral Daily  . cycloSPORINE  2 drop Both Eyes BID  . feeding supplement  237 mL Oral BID BM  . folic acid  1 mg Oral Daily  . heparin  5,000 Units Subcutaneous Q8H  . hydrocortisone sod succinate (SOLU-CORTEF) inj  50 mg Intravenous Daily  . levothyroxine  50 mcg Oral Q0600  . magnesium oxide  200 mg Oral BID  . multivitamin with minerals  1 tablet Per Tube Daily  . nutrition supplement (JUVEN)  1 packet Per Tube BID WC  . pantoprazole (PROTONIX) IV  40 mg Intravenous Q24H  . vitamin B-12  1,000 mcg Oral Daily    Continuous Infusions: . sodium chloride Stopped (04/12/21 0600)  . dextrose 75 mL/hr at 04/14/21 1056  . dextrose 5 % and 0.9% NaCl Stopped (04/12/21 0935)  . feeding supplement (OSMOLITE 1.2 CAL) 1,000 mL (04/13/21 1945)  . piperacillin-tazobactam (ZOSYN)  IV 3.375 g (04/14/21 0618)  . potassium PHOSPHATE IVPB (in mmol) 15 mmol (04/14/21 1051)    PRN Meds: sodium chloride, acetaminophen, HYDROmorphone (DILAUDID) injection, oxyCODONE-acetaminophen  Physical Exam         Resting in bed, awake, more alert today although doesn't verbalize No distress currently Regular work of breathing Appears  cachectic Pictures of wounds from media section on the chart have been viewed.   Vital Signs: BP (!) 150/61 (BP Location: Right Arm)   Pulse 91   Temp (!) 97.5 F (36.4 C) (Oral)   Resp 17   Ht 5' 7.5" (1.715 m)   Wt 56.5 kg   SpO2 (!) 76%   BMI 19.22 kg/m  SpO2: SpO2: (!) 76 % O2 Device: O2 Device: Nasal Cannula O2 Flow Rate: O2 Flow Rate (L/min): 2  L/min  Intake/output summary:   Intake/Output Summary (Last 24 hours) at 04/14/2021 1233 Last data filed at 04/14/2021 0700 Gross per 24 hour  Intake 2806.28 ml  Output 200 ml  Net 2606.28 ml   LBM: Last BM Date: 04/12/21 (per chart) Baseline Weight: Weight: 54 kg Most recent weight: Weight: 56.5 kg      PPS 40% Palliative Assessment/Data:      Patient Active Problem List   Diagnosis Date Noted  . Severe protein-calorie malnutrition ([place]) 04/10/2021  . Pseudomonas aeruginosa infection   . SIRS (systemic inflammatory response syndrome) ([facility])   . Cellulitis, leg 04/07/2021  . Wound cellulitis 04/25/2021  . Open wound   . PAF (paroxysmal atrial fibrillation) ([place])   . AKI (acute kidney injury) ([place]) 02/07/2021  . Macrocytic anemia 02/07/2021  . Thrombocytosis 02/07/2021  . PAH (pulmonary artery hypertension) ([place]) 02/07/2021  . Chronic diastolic CHF (congestive heart failure) ([place]) 02/07/2021  . Non-healing wound of right lower extremity 02/07/2021  . Physical deconditioning 02/07/2021  . CREST syndrome ([place])   . Iron deficiency anemia due to chronic blood loss 01/26/2021  . Anemia due to chronic blood loss 01/18/2021  . Anxiety state 01/18/2021  . 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 Disorder of calcium metabolism 01/18/2021  . Gastro-esophageal reflux disease with esophagitis, without bleeding 01/18/2021  . Oxygen dependent 01/18/2021  . Ulcer of esophagus with bleeding 01/18/2021  . Gangrene of finger of right hand ([place]) 11/29/2020  . Malnutrition of moderate degree 11/22/2020  . GI bleed 11/21/2020  .  Eustachian tube dysfunction, bilateral 07/29/2020  . 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 Wrist fracture 11/24/2019  . Multiple closed stable fractures of pubic ramus ([place]) 11/23/2019  . Distal radius fracture, left 11/23/2019  . Fall at home, initial encounter 11/23/2019  . AMS (altered mental status) 11/12/2019  . Altered mental status 11/11/2019  . Hypomagnesemia 11/11/2019  . Osteoporosis 01/22/2019  . Hypothyroidism 06/16/2018  . Pulmonary hypertension associated with systemic disorder ([place]) 10/08/2017  . 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 Dry gangrene ([place]) 09/11/2017  . Medication reaction 09/11/2017  . ILD (interstitial lung disease) ([place]) 07/22/2017  . Nasal septal perforation 07/10/2017  . Chronic respiratory failure with hypoxia ([place]) 10/31/2016  . Atherosclerosis of native arteries of extremities with gangrene, right leg ([place]) 09/14/2016  . Prolonged Q-T interval on ECG 07/29/2016  . CKD (chronic kidney disease), stage III ([place]) 07/29/2016  . Scleroderma ([place]) 05/06/2016  . Depression 04/24/2016  . Extensor tenosynovitis of wrist, right 02/24/2016  . 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 Hypokalemia 10/07/2015  . 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[place]) 09/01/2015  . Allergic rhinitis 03/08/2015  . High risk medication use 04/09/2013  . Venous hypertension, chronic, with ulcer and inflammation ([place]) 01/29/2013  . Lymphoma, non-Hodgkin's ([place]) 01/27/2013  . Right heart failure ([place]) 01/27/2013  . Varicose veins of lower extremities with ulcer and inflammation ([place]) 07/24/2012  . 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[place]) 04/08/2012  . Pulmonary hypertension associated with systemic disorder ([place]) 03/25/2012  . Peripheral arterial occlusive disease ([place]) 08/10/2011  . Essential hypertension 08/10/2011  . Hyperlipidemia 08/10/2011  . Arterial embolus  and thrombosis of lower extremity ([place]) 08/10/2011    Palliative Care Assessment & Plan   Patient Profile:  70 y.o.femalewith medical history significant forwith PMH of scleroderma diagnosed in 1968, crest syndrome, pulmonary HTN, on continuous oxygen between 2-3L  [place], non Hodgkin lymphomaof the brains/p chemotherapy in remission, PAD, severe PVD s/p multiple stents and ablations, CHF, DVT s/p IVC filter-not on anticoagulation due to GI bleed, anemiarequiring IV iron transfusion, chronic leg ulcers with chronic pseudomonas colonizationwho presents as a transfer from [facility] ED for AMS and worsening chronic leg wounds.    Patient remains admitted to hospital medicine service, with ID colleagues also following, for pseudomonas aeruginosa bacteremia, likely with osteomyelitis foot, AKI on CKD. It is noted that patient reportedly declined amputation in the past. Remains on antibiotics. Palliative consult for goals of care discussions has been requested.   Assessment: More awake today.  Appears in mild distress  Recommendations/Plan:   full code full scope care for now.   Trial of artificial nutrition Continue current mode of care: re discussed goals of care with patient's husband, he wishes to continue.    Goals of Care and Additional Recommendations:  Limitations on Scope of Treatment: Full Scope Treatment  Code Status:    Code Status Orders  (From admission, onward)         Start     Ordered   04/14/2021 2323  Full code  Continuous        04/11/2021 2323        Code Status History    Date Active Date Inactive Code Status Order ID Comments User Context   02/07/2021 1344 02/10/2021 2358 Full Code [id number]  Dwyane Dee, MD ED   01/02/2021 1404 01/02/2021 2329 Full Code [id number]  Waynetta Sandy, MD Inpatient   11/21/2020 1747 11/24/2020 0106 Full Code [id number]  Hosie Poisson, MD ED   10/04/2020 1200 10/04/2020 2140 Full Code [id number]  Serafina Mitchell, MD Inpatient    11/23/2019 1807 11/26/2019 1603 Full Code [id number]  Norval Morton, MD ED   11/13/2019 1032 11/15/2019 1822 DNR [id number]  Domenic Polite, MD Inpatient   11/11/2019 1835 11/13/2019 1032 Full Code [id number]  Barb Merino, MD ED   04/19/2017 0956 04/19/2017 1411 Full Code [id number]  Bensimhon, Shaune Pascal, MD Inpatient   09/14/2016 2000 09/15/2016 1342 Full Code [id number]  Alvia Grove, PA-C Inpatient   09/07/2016 1107 09/07/2016 2014 Full Code [id number]  [name], MD Inpatient   07/29/2016 1214 08/01/2016 1719 Full Code [id number]  Willia Craze, NP ED   05/06/2016 2036 05/08/2016 1649 Full Code [id number]  Reubin Milan, MD Inpatient   10/05/2015 1752 10/08/2015 1502 Full Code [id number]  Eugenie Filler, MD Inpatient   12/17/2013 1036 12/18/2013 1643 Full Code [id number]  Conrad [place], NP Inpatient   Advance Care Planning Activity    Advance Directive Documentation   Flowsheet Row Most Recent Value  Type of Advance Directive Living will  Pre-existing out of facility DNR order (yellow form or pink MOST form) --  "MOST" Form in Place? --       Prognosis:   Unable to determine  Discharge Planning:  To Be Determined  Care plan was discussed with  Patient's husband at bedside.   Thank you for allowing the Palliative Medicine Team to assist in the care of this patient.   Time In: 10 Time Out: 10.25 Total Time 25 Prolonged Time Billed  no       Greater than 50%  of this time was spent counseling and coordinating care related to the above assessment and plan.  Loistine Chance, MD  Please contact Palliative Medicine Team phone at [phone] for questions and concerns.

## 2021-04-14 NOTE — Progress Notes (Signed)
No external measurements for NG tube charted. NG tube measured externally at 64 cm, secured to nose with tape, auscultated for air bubble at this time. Lung sounds coarse diminished upon auscultation. Last x-ray 4/15 2130 verifies placement.

## 2021-04-14 NOTE — Progress Notes (Signed)
Patients RR increased to 36 and breathing is labored. NP an rapid response contacted.

## 2021-04-14 NOTE — Progress Notes (Signed)
PROGRESS NOTE  [name] [name]  DOB: [birth year]  PCP: [name], MD JPE:[id number]  DOA: 04/08/2021  LOS: 8 days   Chief Complaint  Patient presents with  . Altered Mental Status    Brief narrative: [name] [name] is a 70 y.o. 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 Patient was brought from home to the ED on 04/18/2021 for altered mental status and worsening of chronic leg wounds.  During the course of this hospitalization, patient was noted to have Pseudomonas aeruginosa bacteremia likely due to osteomyelitis of her foot.  Patient continued to decline amputation like she did in the past. ID, palliative consultations were obtained. Patient hospitalized and is prolonged because of persistently altered mental status change. See below for details  04/14/2021: Patient seen alongside patient's husband.  Patient remains minimally responsive.  Patient opens her eyes intermittently.  Otherwise, no significant communication with the patient.  Prognosis remains guarded  Subjective: No history from patient.  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  In the past, she refused amputation.   -Presented with worsening wound, leukocytosis to 23,000, elevated lactic acid to 2.4. -4/8, MRI left foot showed abnormal edema distally in the  prox phalanx of the small toe suspicious for active osteomyelitis.  -Blood culture from 4/7 grew Pseudomonas aeruginosa.  Repeat blood culture on 4/9 negative -Currently on renally dosed Zosyn. -Since patient has been traditionally refusing amputation, she may need prolonged antibiotic course.  -Wound care consult obtained.  But the extent of the wound is beyond the expertise of wound care.  -4/14, consultation seen by Dr. Sharol Given.  For the extent of her wound, ideally she would need amputation of both her lower extremities but because of her frailty, she is not a candidate for any surgical intervention at this time.  -White count and lactic acid level improved to normal. 04/14/2021: Continue antibiotics. Recent Labs  Lab 04/09/21 2202 04/10/21 0648 04/11/21 0525 04/12/21 0553 04/13/21 0541 04/14/21 0531  WBC  --  15.1* 13.9* 9.9 10.2 11.2*  LATICACIDVEN 1.8  --   --   --   --   --    Acute metabolic encephalopathy -Unclear etiology.  Probably multifactorial: Dehydration, hypernatremia, infection, scleroderma, history of craniectomy -4/12 CT head did not show any acute abnormality. 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 -On dextrose drip because of hypernatremia AKI on CKD 3B -Creatinine at baseline less than 1.4.  Currently elevated and continues to trend up despite hydration and nutrition -Monitor on IV fluid. 04/14/2021: Serum creatinine today is 1.91.  AKI is likely multifactorial.  Suspect combined prerenal and ATN. Recent Labs    04/24/2021 1539 04/07/21 0006 04/08/21 0523 04/09/21 0615 04/09/21 2202 04/10/21 7225 04/11/21 0525 04/12/21 0553 04/13/21 0541 04/14/21 0531  BUN 49* 44* 45* 46* 44* 45* 39* 43*  55* 76*  CREATININE 1.27* 1.16* 1.24* 1.14* 1.44* 1.50* 1.75* 1.86* 1.93* 1.91*    Hypernatremia -Sodium level is running high because of poor oral intake -4/13, IV fluid was switched to dextrose drip. Currently running at 75 mL/h.  Sodium level improved from 155 yesterday to 151 today.  Continue to monitor sodium level. Recent Labs  Lab 04/08/21 0523 04/09/21 0615 04/09/21 2202 04/10/21 0648 04/11/21 0525 04/12/21 0553 04/13/21 0541 04/14/21 0531  NA 134* 135 142 140 148* 155* 151* 144   Hypokalemia/hypophosphatemia -Labs this morning with phosphorus level significantly low at 1.1.  Discussed with pharmacy.  Replacement ordered.  Recheck tomorrow. Recent Labs  Lab 04/10/21 0648 04/11/21 0525 04/12/21 0553 04/12/21 1141 04/12/21 1730 04/13/21 0541 04/13/21 1732 04/14/21 0531  K 3.4* 3.8 2.9*  --   --  4.1  --  4.0  MG  --   --   --  2.3 2.4 2.1 2.0  --   PHOS  --   --   --  1.7* 2.1* 1.1*  --  1.3*   Anemia of chronic disease  hx of GI bleed -Baseline hemoglobin between 9-10. -No active bleeding but hemoglobin this admission is running between 8-9.   -Continue monitor Recent Labs    07/08/20 1134 10/04/20 0629 11/21/20 1603 11/22/20 0310 01/10/21 0933 01/24/21 0000 01/26/21 1508 01/26/21 1509 02/05/21 1608 02/08/21 0530 02/09/21 0500 03/03/21 1521 03/03/21 1522 04/24/2021 1539 04/10/21 0648 04/11/21 0525 04/12/21 0553 04/13/21 0541 04/14/21 0531  HGB 10.0*   < > 5.3*   < > 8.6*   < > 9.3*  --    < >  --    < >  --  10.1*   < > 7.8* 8.0* 8.6* 8.3* 8.1*  MCV 100.0   < > 113.1*   < > 95.1   < > 101.0*  --    < >  --    < >  --  96.4   < > 100.0 105.1* 104.2* 106.3* 104.8*  VITAMINB12  --   --  742  --  675  --  865  --   --  1,429*  --   --   --   --   --   --   --   --   --   FOLATE  --   --  106.8  --  21.3  --   --  94.9  --  58.1  --   --   --   --   --   --   --   --   --   FERRITIN  --   --  195  --  179  --  270  --   --   --   --   --  786*  --   --   --   --   --   --   TIBC  --   --  376  --  269  --  270  --   --   --   --    --  198*  --   --   --   --   --   --   IRON 95  --  39  --  41*  --  29*  --   --   --   --   --  21*  --   --   --   --   --   --  RETICCTPCT  --   --  10.5*  --   --   --  2.4  --   --   --   --  1.3  --   --   --   --   --   --   --    < > = values in this interval not displayed.   Scleroderma Crest syndrome  interstitial lung disease with chronic hypoxemia Pulmonary hypertension -Chronically on prednisone.  Currently on IV hydrocortisone 50 mg daily.   -On Revatio and macitentan for pulmonary hypertension -On 2 to 3 L oxygen by nasal cannula at baseline -Follows with outpatient pulmonology  PAD -Plavix as tolerated  Paroxysmal atrial fibrillation -Not on anticoagulation due to previous GI bleed  History of DVT -Has IVC filter in place -Seems to be stable at this time  Hyperlipidemia -Off statin due to myalgia  Goals of care -Patient's overall clinical status seems to be worsening.  Palliative care consultation was obtained.  Husband chose to keep her full code.  -Yesterday and today, I had a long conversation with him about patient's worsening health condition.  Patient is at high risk of in-hospital death which I clearly stated to him.  My recommendation would be to consider comfort care at this time.  Has been remains hopeful especially after she was started on NG feeding yesterday he wants to wait next few days before deciding differently.    Mobility: may encourage ambulation after mental status improves Code Status:   Code Status: Full Code  Nutritional status: Body mass index is 19.22 kg/m. Nutrition Problem: Severe Malnutrition Etiology: chronic illness,wound healing Signs/Symptoms: severe fat depletion,severe muscle depletion Diet Order            Diet Heart Room service appropriate? Yes; Fluid consistency: Thin  Diet effective now                 DVT prophylaxis: heparin injection 5,000 Units Start: 04/07/21 1500   Antimicrobials:  IV  Zosyn Fluid: Dextrose drip at 75 mL/h Consultants: Palliative care, ID Family Communication:  Husband at bedside  Status is: Inpatient  Remains inpatient appropriate because -continue to remain altered  Dispo: The patient is from: Home              Anticipated d/c is to: Home versus SNF              Patient currently is not medically stable to d/c.   Difficult to place patient No       Infusions:  . sodium chloride Stopped (04/12/21 0600)  . dextrose 75 mL/hr at 04/14/21 1056  . dextrose 5 % and 0.9% NaCl Stopped (04/12/21 0935)  . feeding supplement (OSMOLITE 1.2 CAL) 1,000 mL (04/14/21 1420)  . piperacillin-tazobactam (ZOSYN)  IV 3.375 g (04/14/21 1419)  . potassium PHOSPHATE IVPB (in mmol) 15 mmol (04/14/21 1051)    Scheduled Meds: . citalopram  20 mg Oral QHS  . clopidogrel  75 mg Oral Daily  . cycloSPORINE  2 drop Both Eyes BID  . feeding supplement  237 mL Oral BID BM  . folic acid  1 mg Oral Daily  . heparin  5,000 Units Subcutaneous Q8H  . hydrocortisone sod succinate (SOLU-CORTEF) inj  50 mg Intravenous Daily  . levothyroxine  50 mcg Oral Q0600  . magnesium oxide  200 mg Oral BID  . multivitamin with minerals  1 tablet Per Tube Daily  . nutrition supplement (JUVEN)  1 packet Per Tube  BID WC  . pantoprazole (PROTONIX) IV  40 mg Intravenous Q24H  . vitamin B-12  1,000 mcg Oral Daily    Antimicrobials: Anti-infectives (From admission, onward)   Start     Dose/Rate Route Frequency Ordered Stop   04/13/21 0600  piperacillin-tazobactam (ZOSYN) IVPB 3.375 g        3.375 g 12.5 mL/hr over 240 Minutes Intravenous Every 8 hours 04/12/21 1055     04/12/21 0600  ceFEPIme (MAXIPIME) 2 g in sodium chloride 0.9 % 100 mL IVPB  Status:  Discontinued        2 g 200 mL/hr over 30 Minutes Intravenous Every 24 hours 04/11/21 0712 04/12/21 1055   04/08/21 0600  vancomycin (VANCOREADY) IVPB 1000 mg/200 mL  Status:  Discontinued        1,000 mg 200 mL/hr over 60 Minutes  Intravenous Every 36 hours 04/07/21 0059 04/08/21 0828   04/07/21 0600  ceFEPIme (MAXIPIME) 2 g in sodium chloride 0.9 % 100 mL IVPB  Status:  Discontinued        2 g 200 mL/hr over 30 Minutes Intravenous Every 12 hours 04/07/21 0059 04/11/21 0712   04/02/2021 1715  vancomycin (VANCOCIN) IVPB 1000 mg/200 mL premix        1,000 mg 200 mL/hr over 60 Minutes Intravenous  Once 04/21/2021 1711 04/23/2021 2045   04/28/2021 1715  ceFEPIme (MAXIPIME) 2 g in sodium chloride 0.9 % 100 mL IVPB        2 g 200 mL/hr over 30 Minutes Intravenous  Once 04/24/2021 1711 04/15/2021 1800      PRN meds: sodium chloride, acetaminophen, HYDROmorphone (DILAUDID) injection, oxyCODONE-acetaminophen   Objective: Vitals:   04/14/21 1227 04/14/21 1300  BP: (!) 150/61 (!) 142/70  Pulse: 91 84  Resp: 17   Temp: (!) 97.5 F (36.4 C) 97.8 F (36.6 C)  SpO2: (!) 76% 99%    Intake/Output Summary (Last 24 hours) at 04/14/2021 1631 Last data filed at 04/14/2021 0700 Gross per 24 hour  Intake 2806.28 ml  Output 200 ml  Net 2606.28 ml   Filed Weights   04/05/2021 2300 04/13/21 0500 04/14/21 0700  Weight: 53.9 kg 53.5 kg 56.5 kg   Weight change: 3 kg Body mass index is 19.22 kg/m.   Physical Exam: General exam: Chronically ill looking.  Encephalopathic. 10.  Opens eyes intermittently.  No significant interaction.   HEENT: Patient is pale.  No jaundice.   Lungs: Clear to auscultation bilaterally CVS: S1-S2.   GI/Abd soft, nontender, nondistended, bowel sound present CNS: Patient remains encephalopathic.  Patient opens eyes intermittently.   Extremities: Chronic wound on the legs bandaged. .  Data Review: I have personally reviewed the laboratory data and studies available.  Recent Labs  Lab 04/10/21 0648 04/11/21 0525 04/12/21 0553 04/13/21 0541 04/14/21 0531  WBC 15.1* 13.9* 9.9 10.2 11.2*  HGB 7.8* 8.0* 8.6* 8.3* 8.1*  HCT 25.7* 27.0* 29.9* 28.9* 28.3*  MCV 100.0 105.1* 104.2* 106.3* 104.8*  PLT 334 312  282 219 193   Recent Labs  Lab 04/10/21 0648 04/11/21 0525 04/12/21 0553 04/12/21 1141 04/12/21 1730 04/13/21 0541 04/13/21 1732 04/14/21 0531  NA 140 148* 155*  --   --  151*  --  144  K 3.4* 3.8 2.9*  --   --  4.1  --  4.0  CL 104 113* 121*  --   --  121*  --  114*  CO2 14* 16* 21*  --   --  22  --  24  GLUCOSE 77 103* 139*  --   --  129*  --  156*  BUN 45* 39* 43*  --   --  55*  --  76*  CREATININE 1.50* 1.75* 1.86*  --   --  1.93*  --  1.91*  CALCIUM 8.4* 8.4* 8.2*  --   --  8.5*  --  8.1*  MG  --   --   --  2.3 2.4 2.1 2.0  --   PHOS  --   --   --  1.7* 2.1* 1.1*  --  1.3*    F/u labs ordered Unresulted Labs (From admission, onward)          Start     Ordered   04/15/21 0500  Phosphorus  Tomorrow morning,   R       Question:  Specimen collection method  Answer:  Lab=Lab collect   04/14/21 1152   04/13/21 5686  Basic metabolic panel  Daily,   R     Question:  Specimen collection method  Answer:  Lab=Lab collect   04/12/21 1301   04/13/21 0500  CBC  Daily,   R     Question:  Specimen collection method  Answer:  Lab=Lab collect   04/12/21 1301   Unscheduled  Occult blood card to lab, stool RN will collect  As needed,   R     Question:  Specimen to be collected by:  Answer:  RN will collect   04/07/21 0030          Signed, Bonnell Public, MD [facility] 04/14/2021

## 2021-04-14 NOTE — Progress Notes (Signed)
[place] for Infectious Disease  Date of Admission:  04/14/2021           Reason for visit: Follow up on PsA bacteremia and OM  Current antibiotics: Pip Tazo 4/13--present   Previous antibiotics: Vancomycin 4/7--4/8 Cefepime 4/7--4/13  ASSESSMENT:    1. 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  Blood cultures 04/12/2021 are positive for Pseudomonas aeruginosa.  Repeat blood cultures 04/08/2021 no growth finalized.  2. 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 PCN Allergy: Listed in her chart.  We spoke with her husband who endorsed a rash about 40 years ago.  She has tolerated Keflex and other cephalosporins since this reaction.  There is a high likelihood she has outgrown this allergy and we have all agreed to directly challenge her.  She is tolerating Pip tazo thus far and penicillin has been removed from her allergy list 4. Encephalopathy: Likely multifactorial and hopefully will start to improve.  Low suspicion for cefepime induced neurotoxicity, however, we switched antibiotics from cefepime to PIP days ago on 4/13 to remove this from consideration 5. Acute kidney injury on CKD: Creatinine stabilized today 6. Peripheral vascular disease: Status post multiple stents and ablations previously 7. CREST 8. Pulmonary hypertension on continuous oxygen  PLAN:    . Continue renally dosed piperacillin tazobactam  . Continue wound care . Appreciate palliative care discussions . Appreciate orthopedic evaluation and Dr Jess Barters recommendations . Overall, seems unlikely to heal these wounds without more definitive source control such as amputation, however, patient has previously declined this recommendation and currently not a surgical candidate either.  . Dr. [place] Bali is on-call over the weekend as needed, otherwise I will be back on  Monday     Principal Problem:   Cellulitis, leg Active Problems:   Peripheral arterial occlusive disease ([place])   Pulmonary hypertension associated with systemic disorder ([place])   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[place])   Scleroderma ([place])   Atherosclerosis of native arteries of extremities with gangrene, right leg (H[facility])   Chronic respiratory failure with hypoxia (H[facility])   ILD (interstitial lung disease) ([place])   AKI (acute kidney injury) ([place])   PAF (paroxysmal atrial fibrillation) ([place])   Wound cellulitis   SIRS (systemic inflammatory response syndrome) ([place])   Pseudomonas aeruginosa infection   Severe protein-calorie malnutrition (HCC)    MEDICATIONS:    Scheduled Meds: . citalopram  20 mg Oral QHS  . clopidogrel  75 mg Oral Daily  . cycloSPORINE  2 drop Both Eyes BID  . feeding supplement  237 mL Oral BID BM  . folic acid  1 mg Oral Daily  . heparin  5,000 Units Subcutaneous Q8H  . hydrocortisone sod succinate (SOLU-CORTEF) inj  50 mg Intravenous Daily  . levothyroxine  50 mcg Oral Q0600  . magnesium oxide  200 mg Oral BID  . multivitamin with minerals  1 tablet Per Tube Daily  . nutrition supplement (JUVEN)  1 packet Per Tube BID WC  . pantoprazole (PROTONIX) IV  40 mg Intravenous Q24H  . vitamin B-12  1,000 mcg Oral Daily   Continuous Infusions: . sodium chloride Stopped (04/12/21 0600)  . dextrose 75 mL/hr at 04/14/21 1056  . dextrose 5 % and 0.9% NaCl Stopped (04/12/21 0935)  . feeding supplement (OSMOLITE 1.2 CAL) 1,000 mL (04/13/21 1945)  . piperacillin-tazobactam (ZOSYN)  IV 3.375 g (04/14/21  [place].Seta)  . potassium PHOSPHATE IVPB (in mmol) 15 mmol (04/14/21 1051)   PRN Meds:.sodium chloride, acetaminophen, HYDROmorphone (DILAUDID) injection, oxyCODONE-acetaminophen  SUBJECTIVE:   24 hour events:  No acute events noted Stable WBC Creatinine stable Sodium improved, potassium stable Afebrile No new imaging No new cultures  Patient's husband present at the bedside.   Discussed plan of care at this point.  Review of Systems  Unable to perform ROS: Medical condition      OBJECTIVE:   Blood pressure 132/73, pulse 80, temperature 98 F (36.7 C), temperature source Oral, resp. rate 19, height 5' 7.5" (1.715 m), weight 56.5 kg, SpO2 100 %. Body mass index is 19.22 kg/m.  Physical Exam Constitutional:      Comments: Ill-appearing, cachectic, thin woman lying in bed no acute distress  HENT:     Head: Normocephalic and atraumatic.     Comments: NG tube in place Eyes:     Extraocular Movements: Extraocular movements intact.     Conjunctiva/sclera: Conjunctivae normal.  Pulmonary:     Effort: No respiratory distress.     Comments: Continues on nasal cannula Skin:    General: Skin is warm and dry.     Findings: No rash.     Comments: Pictures of wounds reviewed in the media tab  Neurological:     Comments: She appears to be more awake today although not engaging or oriented.  She does seem to be tracking some with her eyes.      Lab Results: Lab Results  Component Value Date   WBC 11.2 (H) 04/14/2021   HGB 8.1 (L) 04/14/2021   HCT 28.3 (L) 04/14/2021   MCV 104.8 (H) 04/14/2021   PLT 193 04/14/2021    Lab Results  Component Value Date   NA 144 04/14/2021   K 4.0 04/14/2021   CO2 24 04/14/2021   GLUCOSE 156 (H) 04/14/2021   BUN 76 (H) 04/14/2021   CREATININE 1.91 (H) 04/14/2021   CALCIUM 8.1 (L) 04/14/2021   GFRNONAA 28 (L) 04/14/2021   GFRAA 50 (L) 01/10/2021    Lab Results  Component Value Date   ALT 40 04/12/2021   AST 44 (H) 04/12/2021   ALKPHOS 112 04/12/2021   BILITOT 0.6 04/12/2021       Component Value Date/Time   CRP 16.8 (H) 04/11/2021 0525       Component Value Date/Time   ESRSEDRATE 138 (H) 04/11/2021 0525     I have reviewed the micro and lab results in Epic.  Imaging: DG CHEST PORT 1 VIEW  Result Date: 04/12/2021 CLINICAL DATA:  Nasogastric tube positioning EXAM: PORTABLE CHEST 1 VIEW COMPARISON:   Portable exam 1431 hours compared to earlier study of 12/30 9 hours FINDINGS: Tip of nasogastric tube now projects over stomach. Remainder of chest unchanged since preceding exam. Atherosclerotic calcification aorta. IVC filter noted. Paucity of bowel gas in the upper abdomen. IMPRESSION: Tip of nasogastric tube now projects over stomach. Findings discussed with Maudie Mercury RN on 6E on 04/12/2021 at 1445 hours. Electronically Signed   By: Lavonia Dana M.D.   On: 04/12/2021 14:47   DG CHEST PORT 1 VIEW  Result Date: 04/12/2021 CLINICAL DATA:  Nasogastric tube placement EXAM: PORTABLE CHEST 1 VIEW COMPARISON:  Portable exam 1239 hours compared to 04/25/2021 FINDINGS: Nasogastric tube extends into RIGHT lower lobe bronchus recommend withdrawal and replacement. Patient rotated to the LEFT. Upper normal size of cardiac silhouette. Atherosclerotic calcifications aorta. Chronic accentuation of pulmonary markings. Lungs otherwise clear. No pleural  effusion or pneumothorax. Bones demineralized. IMPRESSION: Tip of nasogastric tube is in the RIGHT lower lobe bronchus, recommend withdrawal and replacement. Critical Value/emergent results were called by telephone at the time of interpretation on 04/12/2021 at 1434 hours to [facility] on [place], who verbally acknowledged these results. Electronically Signed   By: Lavonia Dana M.D.   On: 04/12/2021 14:35      [facility] [phone] pager 04/14/2021, 11:15 AM

## 2021-04-14 NOTE — Care Management Important Message (Signed)
Medicare IM printed to give to the patient, by Donnie Coffin

## 2021-04-15 DIAGNOSIS — Z7189 Other specified counseling: Secondary | ICD-10-CM | POA: Diagnosis not present

## 2021-04-15 DIAGNOSIS — N179 Acute kidney failure, unspecified: Secondary | ICD-10-CM | POA: Diagnosis not present

## 2021-04-15 DIAGNOSIS — L03115 Cellulitis of right lower limb: Secondary | ICD-10-CM | POA: Diagnosis not present

## 2021-04-15 DIAGNOSIS — J9611 Chronic respiratory failure with hypoxia: Secondary | ICD-10-CM | POA: Diagnosis not present

## 2021-04-15 DIAGNOSIS — Z515 Encounter for palliative care: Secondary | ICD-10-CM | POA: Diagnosis not present

## 2021-04-15 DIAGNOSIS — R531 Weakness: Secondary | ICD-10-CM | POA: Diagnosis not present

## 2021-04-15 LAB — BASIC METABOLIC PANEL
Anion gap: 11 (ref 5–15)
Anion gap: 9 (ref 5–15)
BUN: 72 mg/dL — ABNORMAL HIGH (ref 8–23)
BUN: 73 mg/dL — ABNORMAL HIGH (ref 8–23)
CO2: 20 mmol/L — ABNORMAL LOW (ref 22–32)
CO2: 24 mmol/L (ref 22–32)
Calcium: 7.9 mg/dL — ABNORMAL LOW (ref 8.9–10.3)
Calcium: 8.1 mg/dL — ABNORMAL LOW (ref 8.9–10.3)
Chloride: 112 mmol/L — ABNORMAL HIGH (ref 98–111)
Chloride: 110 mmol/L (ref 98–111)
Creatinine, Ser: 1.92 mg/dL — ABNORMAL HIGH (ref 0.44–1.00)
Creatinine, Ser: 1.77 mg/dL — ABNORMAL HIGH (ref 0.44–1.00)
GFR, Estimated: 28 mL/min — ABNORMAL LOW (ref >60)
GFR, Estimated: 30 mL/min — ABNORMAL LOW (ref >60)
Glucose, Bld: 142 mg/dL — ABNORMAL HIGH (ref 70–99)
Glucose, Bld: 255 mg/dL — ABNORMAL HIGH (ref 70–99)
Potassium: 4.9 mmol/L (ref 3.5–5.1)
Potassium: 4.1 mmol/L (ref 3.5–5.1)
Sodium: 143 mmol/L (ref 135–145)
Sodium: 143 mmol/L (ref 135–145)

## 2021-04-15 LAB — CBC WITH DIFFERENTIAL/PLATELET
Abs Immature Granulocytes: 0.65 10*3/uL — ABNORMAL HIGH (ref 0.00–0.07)
Basophils Absolute: 0 10*3/uL (ref 0.0–0.1)
Basophils Relative: 0 %
Eosinophils Absolute: 0 10*3/uL (ref 0.0–0.5)
Eosinophils Relative: 0 %
HCT: 26.3 % — ABNORMAL LOW (ref 36.0–46.0)
Hemoglobin: 7.6 g/dL — ABNORMAL LOW (ref 12.0–15.0)
Immature Granulocytes: 5 %
Lymphocytes Relative: 5 %
Lymphs Abs: 0.7 10*3/uL (ref 0.7–4.0)
MCH: 30.8 pg (ref 26.0–34.0)
MCHC: 28.9 g/dL — ABNORMAL LOW (ref 30.0–36.0)
MCV: 106.5 fL — ABNORMAL HIGH (ref 80.0–100.0)
Monocytes Absolute: 0.9 10*3/uL (ref 0.1–1.0)
Monocytes Relative: 6 %
Neutro Abs: 12.1 10*3/uL — ABNORMAL HIGH (ref 1.7–7.7)
Neutrophils Relative %: 84 %
Platelets: 181 10*3/uL (ref 150–400)
RBC: 2.47 MIL/uL — ABNORMAL LOW (ref 3.87–5.11)
RDW: 21.2 % — ABNORMAL HIGH (ref 11.5–15.5)
WBC: 14.4 10*3/uL — ABNORMAL HIGH (ref 4.0–10.5)
nRBC: 0.2 % (ref 0.0–0.2)

## 2021-04-15 LAB — GLUCOSE, CAPILLARY
Glucose-Capillary: 187 mg/dL — ABNORMAL HIGH (ref 70–99)
Glucose-Capillary: 105 mg/dL — ABNORMAL HIGH (ref 70–99)
Glucose-Capillary: 44 mg/dL — CL (ref 70–99)
Glucose-Capillary: 23 mg/dL — CL (ref 70–99)
Glucose-Capillary: 205 mg/dL — ABNORMAL HIGH (ref 70–99)
Glucose-Capillary: 228 mg/dL — ABNORMAL HIGH (ref 70–99)
Glucose-Capillary: 228 mg/dL — ABNORMAL HIGH (ref 70–99)
Glucose-Capillary: 203 mg/dL — ABNORMAL HIGH (ref 70–99)

## 2021-04-15 LAB — CBC
HCT: 27.4 % — ABNORMAL LOW (ref 36.0–46.0)
Hemoglobin: 7.8 g/dL — ABNORMAL LOW (ref 12.0–15.0)
MCH: 29.9 pg (ref 26.0–34.0)
MCHC: 28.5 g/dL — ABNORMAL LOW (ref 30.0–36.0)
MCV: 105 fL — ABNORMAL HIGH (ref 80.0–100.0)
Platelets: 189 10*3/uL (ref 150–400)
RBC: 2.61 MIL/uL — ABNORMAL LOW (ref 3.87–5.11)
RDW: 21.5 % — ABNORMAL HIGH (ref 11.5–15.5)
WBC: 13.2 10*3/uL — ABNORMAL HIGH (ref 4.0–10.5)
nRBC: 0.2 % (ref 0.0–0.2)

## 2021-04-15 LAB — MRSA PCR SCREENING: MRSA by PCR: NEGATIVE

## 2021-04-15 LAB — PROTEIN / CREATININE RATIO, URINE
Creatinine, Urine: 33.15 mg/dL
Protein Creatinine Ratio: 4.1 mg/mg{Cre} — ABNORMAL HIGH (ref 0.00–0.15)
Total Protein, Urine: 136 mg/dL

## 2021-04-15 LAB — SODIUM, URINE, RANDOM: Sodium, Ur: 27 mmol/L

## 2021-04-15 LAB — PHOSPHORUS: Phosphorus: 2.5 mg/dL (ref 2.5–4.6)

## 2021-04-15 LAB — LACTIC ACID, PLASMA: Lactic Acid, Venous: 1.8 mmol/L (ref 0.5–1.9)

## 2021-04-15 MED ORDER — DEXTROSE 50 % IV SOLN
INTRAVENOUS | Status: AC
  Filled 2021-04-15: qty 50

## 2021-04-15 MED ORDER — DEXTROSE 50 % IV SOLN
INTRAVENOUS | Status: AC
  Administered 2021-04-15: 50 mL
  Filled 2021-04-15: qty 50

## 2021-04-15 NOTE — Progress Notes (Signed)
PROGRESS NOTE  [name] [name]  DOB: 02-12-1951  PCP: [name], MD MMN:[id number]  DOA: 04/11/2021  LOS: 9 days   Chief Complaint  Patient presents with  . Altered Mental Status    Brief narrative: [name] [name] is a 70 y.o. female with PMH significant for scleroderma diagnosed in 1968, crest syndrome, pulmonary HTN, on continuous oxygen between 2-3L [place], non Hodgkin lymphomaof the brains/p chemotherapy in remission, PAD, severe PVD s/p multiple stents and ablations, CHF, DVT s/p IVC filter-not on anticoagulation due to GI bleed, chronic anemiarequiring IV iron transfusion, 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 Patient was brought from home to the ED on 04/01/2021 for altered mental status and worsening of chronic leg wounds.  During the course of this hospitalization, patient was noted to have Pseudomonas aeruginosa bacteremia likely due to osteomyelitis of her foot.  Patient continued to decline amputation like she did in the past. ID, palliative consultations were obtained. Patient hospitalized and is prolonged because of persistently altered mental status change. See below for details  04/14/2021: Patient seen alongside patient's husband.  Patient remains minimally responsive.  Patient opens her eyes intermittently.  Otherwise, no significant communication with the patient.  Prognosis remains guarded  04/15/2021: Patient was transferred to higher level of care last night.  Patient remains same.  Concerns for low blood sugar, however, fingersticks may not be accurate.  Slight improvement in patient's encephalopathy.  Subjective: No history from patient.  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  In the past, she refused amputation.   -Presented with worsening wound, leukocytosis to 23,000, elevated lactic acid to 2.4. -4/8, MRI left foot showed abnormal edema distally in the prox phalanx of the small toe suspicious for active osteomyelitis.  -Blood culture from 4/7 grew Pseudomonas aeruginosa.  Repeat blood culture on 4/9 negative -Currently on renally dosed Zosyn. -Since patient has been traditionally refusing amputation, she may need prolonged antibiotic course.  -Wound care consult obtained.  But the extent of the wound is beyond the expertise of wound care.  -4/14, consultation seen by Dr. Sharol Given.  For the extent of her wound, ideally she would need amputation of both her lower extremities but because of her frailty, she is not a candidate for any surgical intervention at this time.  -White count and lactic acid level improved to normal. 04/15/2021: Continue antibiotics. Recent Labs  Lab 04/09/21 2202 04/10/21 0648 04/12/21 0553 04/13/21 0541 04/14/21 0531 04/14/21 2138 04/15/21 0023  WBC  --    < > 9.9 10.2 11.2* 14.4* 13.2*  LATICACIDVEN 1.8  --   --   --   --  1.9 1.8   < > = values in this interval not displayed.   Acute metabolic encephalopathy -Unclear etiology.  Probably multifactorial: Dehydration, hypernatremia, infection, scleroderma, history of craniectomy -4/12 CT head did not show any acute abnormality. 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 -On dextrose drip because of hypernatremia AKI on CKD 3B -Creatinine at baseline less than 1.4.  Currently elevated and continues to trend up despite hydration and nutrition -Monitor on IV fluid. 04/14/2021: Serum  creatinine today is 1.91.  AKI is likely multifactorial.  Suspect combined prerenal  and ATN. 04/15/2021: Check urine sodium, UPC, ANA, C3 and C4.  Continue to monitor renal function.  Serum creatinine is 1.92. Recent Labs    04/09/21 0615 04/09/21 2202 04/10/21 0648 04/11/21 0525 04/12/21 0553 04/13/21 0541 04/14/21 0531 04/14/21 2138 04/15/21 0023 04/15/21 0908  BUN 46* 44* 45* 39* 43* 55* 76* 74* 72* 73*  CREATININE 1.14* 1.44* 1.50* 1.75* 1.86* 1.93* 1.91* 1.74* 1.92* 1.77*   Hypernatremia -Sodium level is running high because of poor oral intake -4/13, IV fluid was switched to dextrose drip. Currently running at 75 mL/h.  Sodium level improved from 155 yesterday to 151 today.  Continue to monitor sodium level. 04/15/2021: Resolved.  Sodium is 143 today. Recent Labs  Lab 04/09/21 0615 04/09/21 2202 04/10/21 0648 04/11/21 0525 04/12/21 0553 04/13/21 0541 04/14/21 0531 04/14/21 2138 04/15/21 0023 04/15/21 0908  NA 135 142 140 148* 155* 151* 144 141 143 143   Hypokalemia/hypophosphatemia -Labs this morning with phosphorus level significantly low at 1.1.  Discussed with pharmacy.  Replacement ordered.  Recheck tomorrow. 04/15/2021: Resolved.  Potassium is 4.9 and phosphorus is 2.5 today. Recent Labs  Lab 04/12/21 1141 04/12/21 1730 04/13/21 0541 04/13/21 1732 04/14/21 0531 04/14/21 2138 04/15/21 0023 04/15/21 0908  K  --   --  4.1  --  4.0 4.3 4.9 4.1  MG 2.3 2.4 2.1 2.0  --   --   --   --   PHOS 1.7* 2.1* 1.1*  --  1.3*  --  2.5  --    Anemia of chronic disease  hx of GI bleed -Baseline hemoglobin between 9-10. -No active bleeding but hemoglobin this admission is running between 8-9.   -Continue monitor Recent Labs    07/08/20 1134 10/04/20 0629 11/21/20 1603 11/22/20 0310 01/10/21 0933 01/24/21 0000 01/26/21 1508 01/26/21 1509 02/05/21 1608 02/08/21 0530 02/09/21 0500 03/03/21 1521 03/03/21 1522 03/31/2021 1539 04/12/21 0553 04/13/21 0541 04/14/21 0531 04/14/21 2138 04/15/21 0023  HGB 10.0*   < > 5.3*   < > 8.6*   < > 9.3*   --    < >  --    < >  --  10.1*   < > 8.6* 8.3* 8.1* 7.6* 7.8*  MCV 100.0   < > 113.1*   < > 95.1   < > 101.0*  --    < >  --    < >  --  96.4   < > 104.2* 106.3* 104.8* 106.5* 105.0*  VITAMINB12  --   --  742  --  675  --  865  --   --  1,429*  --   --   --   --   --   --   --   --   --   FOLATE  --   --  106.8  --  21.3  --   --  94.9  --  58.1  --   --   --   --   --   --   --   --   --   FERRITIN  --   --  195  --  179  --  270  --   --   --   --   --  786*  --   --   --   --   --   --   TIBC  --   --  376  --  269  --  270  --   --   --   --   --  198*  --   --   --   --   --   --   IRON 95  --  39  --  41*  --  29*  --   --   --   --   --  21*  --   --   --   --   --   --   RETICCTPCT  --   --  10.5*  --   --   --  2.4  --   --   --   --  1.3  --   --   --   --   --   --   --    < > = values in this interval not displayed.   Scleroderma Crest syndrome  interstitial lung disease with chronic hypoxemia Pulmonary hypertension -Chronically on prednisone.  Currently on IV hydrocortisone 50 mg daily.   -On Revatio and macitentan for pulmonary hypertension -On 2 to 3 L oxygen by nasal cannula at baseline -Follows with outpatient pulmonology  PAD -Plavix as tolerated  Paroxysmal atrial fibrillation -Not on anticoagulation due to previous GI bleed  History of DVT -Has IVC filter in place -Seems to be stable at this time  Hyperlipidemia -Off statin due to myalgia  Goals of care -Patient's overall clinical status seems to be worsening.  Palliative care consultation was obtained.  Husband chose to keep her full code.  -Yesterday and today, I had a long conversation with him about patient's worsening health condition.  Patient is at high risk of in-hospital death which I clearly stated to him.  My recommendation would be to consider comfort care at this time.  Has been remains hopeful especially after she was started on NG feeding yesterday he wants to wait next few days before  deciding differently.    Mobility: may encourage ambulation after mental status improves Code Status:   Code Status: Full Code  Nutritional status: Body mass index is 19.61 kg/m. Nutrition Problem: Severe Malnutrition Etiology: chronic illness,wound healing Signs/Symptoms: severe fat depletion,severe muscle depletion Diet Order            Diet NPO time specified  Diet effective now                 DVT prophylaxis: heparin injection 5,000 Units Start: 04/07/21 1500   Antimicrobials:  IV Zosyn Fluid: Dextrose drip at 75 mL/h Consultants: Palliative care, ID Family Communication:  Husband at bedside  Status is: Inpatient  Remains inpatient appropriate because -continue to remain altered  Dispo: The patient is from: Home              Anticipated d/c is to: Home versus SNF              Patient currently is not medically stable to d/c.   Difficult to place patient No       Infusions:  . sodium chloride Stopped (04/12/21 0600)  . dextrose 75 mL/hr at 04/15/21 1100  . dextrose 5 % and 0.9% NaCl Stopped (04/12/21 0935)  . feeding supplement (OSMOLITE 1.2 CAL) 1,000 mL (04/15/21 1051)  . piperacillin-tazobactam (ZOSYN)  IV 12.5 mL/hr at 04/15/21 1100    Scheduled Meds: . Chlorhexidine Gluconate Cloth  6 each Topical Q0600  . citalopram  20 mg Per Tube QHS  . clopidogrel  75 mg Per Tube Daily  .  cycloSPORINE  2 drop Both Eyes BID  . feeding supplement  237 mL Oral BID BM  . folic acid  1 mg Per Tube Daily  . heparin  5,000 Units Subcutaneous Q8H  . hydrocortisone sod succinate (SOLU-CORTEF) inj  50 mg Intravenous Daily  . levothyroxine  50 mcg Per Tube Q0600  . magnesium oxide  200 mg Per Tube BID  . mouth rinse  15 mL Mouth Rinse BID  . multivitamin with minerals  1 tablet Per Tube Daily  . nutrition supplement (JUVEN)  1 packet Per Tube BID WC  . pantoprazole (PROTONIX) IV  40 mg Intravenous Q24H  . vitamin B-12  1,000 mcg Per Tube Daily     Antimicrobials: Anti-infectives (From admission, onward)   Start     Dose/Rate Route Frequency Ordered Stop   04/13/21 0600  piperacillin-tazobactam (ZOSYN) IVPB 3.375 g        3.375 g 12.5 mL/hr over 240 Minutes Intravenous Every 8 hours 04/12/21 1055     04/12/21 0600  ceFEPIme (MAXIPIME) 2 g in sodium chloride 0.9 % 100 mL IVPB  Status:  Discontinued        2 g 200 mL/hr over 30 Minutes Intravenous Every 24 hours 04/11/21 0712 04/12/21 1055   04/08/21 0600  vancomycin (VANCOREADY) IVPB 1000 mg/200 mL  Status:  Discontinued        1,000 mg 200 mL/hr over 60 Minutes Intravenous Every 36 hours 04/07/21 0059 04/08/21 0828   04/07/21 0600  ceFEPIme (MAXIPIME) 2 g in sodium chloride 0.9 % 100 mL IVPB  Status:  Discontinued        2 g 200 mL/hr over 30 Minutes Intravenous Every 12 hours 04/07/21 0059 04/11/21 0712   04/27/2021 1715  vancomycin (VANCOCIN) IVPB 1000 mg/200 mL premix        1,000 mg 200 mL/hr over 60 Minutes Intravenous  Once 04/23/2021 1711 04/20/2021 2045   04/15/2021 1715  ceFEPIme (MAXIPIME) 2 g in sodium chloride 0.9 % 100 mL IVPB        2 g 200 mL/hr over 30 Minutes Intravenous  Once 04/04/2021 1711 04/04/2021 1800      PRN meds: sodium chloride, acetaminophen, HYDROmorphone (DILAUDID) injection, oxyCODONE-acetaminophen   Objective: Vitals:   04/15/21 1012 04/15/21 1100  BP:  (!) 155/67  Pulse: 81 82  Resp: (!) 23 (!) 22  Temp:    SpO2: 96% 92%    Intake/Output Summary (Last 24 hours) at 04/15/2021 1129 Last data filed at 04/15/2021 1100 Gross per 24 hour  Intake 4575.65 ml  Output 700 ml  Net 3875.65 ml   Filed Weights   04/14/21 0700 04/14/21 2230 04/15/21 0300  Weight: 56.5 kg 58.5 kg 58.5 kg   Weight change: 2 kg Body mass index is 19.61 kg/m.   Physical Exam: General exam: Chronically ill looking.  Encephalopathic. 10.  Opens eyes intermittently.  No significant interaction.   HEENT: Patient is pale.  No jaundice.   Lungs: Clear to auscultation  bilaterally CVS: S1-S2.   GI/Abd soft, nontender, nondistended, bowel sound present CNS: Patient remains encephalopathic.  Patient opens eyes intermittently.   Extremities: Chronic wound on the legs bandaged. .  Data Review: I have personally reviewed the laboratory data and studies available.  Recent Labs  Lab 04/12/21 0553 04/13/21 0541 04/14/21 0531 04/14/21 2138 04/15/21 0023  WBC 9.9 10.2 11.2* 14.4* 13.2*  NEUTROABS  --   --   --  12.1*  --   HGB 8.6* 8.3* 8.1*  7.6* 7.8*  HCT 29.9* 28.9* 28.3* 26.3* 27.4*  MCV 104.2* 106.3* 104.8* 106.5* 105.0*  PLT 282 219 193 181 189   Recent Labs  Lab 04/12/21 1141 04/12/21 1730 04/13/21 0541 04/13/21 1732 04/14/21 0531 04/14/21 2138 04/15/21 0023 04/15/21 0908  NA  --   --  151*  --  144 141 143 143  K  --   --  4.1  --  4.0 4.3 4.9 4.1  CL  --   --  121*  --  114* 110 112* 110  CO2  --   --  22  --  24 20* 20* 24  GLUCOSE  --   --  129*  --  156* 204* 142* 255*  BUN  --   --  55*  --  76* 74* 72* 73*  CREATININE  --   --  1.93*  --  1.91* 1.74* 1.92* 1.77*  CALCIUM  --   --  8.5*  --  8.1* 7.8* 7.9* 8.1*  MG 2.3 2.4 2.1 2.0  --   --   --   --   PHOS 1.7* 2.1* 1.1*  --  1.3*  --  2.5  --     F/u labs ordered Unresulted Labs (From admission, onward)          Start     Ordered   04/13/21 3500  Basic metabolic panel  Daily,   R     Question:  Specimen collection method  Answer:  Lab=Lab collect   04/12/21 1301   04/13/21 0500  CBC  Daily,   R     Question:  Specimen collection method  Answer:  Lab=Lab collect   04/12/21 1301   Unscheduled  Occult blood card to lab, stool RN will collect  As needed,   R     Question:  Specimen to be collected by:  Answer:  RN will collect   04/07/21 0030          Signed, Bonnell Public, MD [facility] 04/15/2021

## 2021-04-15 NOTE — Progress Notes (Signed)
Called due to pt increased labored breathing.  Found pt to be very lethargic and unable to follow simple commands.  Pt barely could open her mouth found her mouth to caked with old dried mucous.  Removed all that I could get with suction.  Pt other VSS see flow sheet.  Orders received and initiated.  Pt to transfer to SD for closer monitoring per [facility] NP.  Pt remains a FULL code.  [facility] NP attempted to call pt husband to give him an update,  however no answer.

## 2021-04-15 NOTE — Progress Notes (Signed)
Daily Progress Note   Patient Name: [name] [name]       Date: 04/15/2021 DOB: 26-Jan-1951  Age: 70 y.o. MRN#: [id number] Attending Physician: Bonnell Public, MD Primary Care Physician: [name], MD Admit Date: 04/01/2021  Reason for Consultation/Follow-up: Establishing goals of care  Subjective: Now in stepdown, more awake today, tracks me in the room, opens eyes, recognizes husband at bedside, interacts some with me.     Length of Stay: 9  Current Medications: Scheduled Meds:  . Chlorhexidine Gluconate Cloth  6 each Topical Q0600  . citalopram  20 mg Per Tube QHS  . clopidogrel  75 mg Per Tube Daily  . cycloSPORINE  2 drop Both Eyes BID  . feeding supplement  237 mL Oral BID BM  . folic acid  1 mg Per Tube Daily  . heparin  5,000 Units Subcutaneous Q8H  . hydrocortisone sod succinate (SOLU-CORTEF) inj  50 mg Intravenous Daily  . levothyroxine  50 mcg Per Tube Q0600  . magnesium oxide  200 mg Per Tube BID  . mouth rinse  15 mL Mouth Rinse BID  . multivitamin with minerals  1 tablet Per Tube Daily  . nutrition supplement (JUVEN)  1 packet Per Tube BID WC  . pantoprazole (PROTONIX) IV  40 mg Intravenous Q24H  . vitamin B-12  1,000 mcg Per Tube Daily    Continuous Infusions: . sodium chloride Stopped (04/12/21 0600)  . dextrose 75 mL/hr at 04/15/21 1100  . dextrose 5 % and 0.9% NaCl Stopped (04/12/21 0935)  . feeding supplement (OSMOLITE 1.2 CAL) 1,000 mL (04/15/21 1051)  . piperacillin-tazobactam (ZOSYN)  IV 12.5 mL/hr at 04/15/21 1100    PRN Meds: sodium chloride, acetaminophen, HYDROmorphone (DILAUDID) injection, oxyCODONE-acetaminophen  Physical Exam         Resting in bed, awake, more alert today follows commands and interacts some.  No distress  currently Regular work of breathing Appears cachectic Pictures of wounds from media section on the chart have been viewed.   Vital Signs: BP (!) 155/67   Pulse 82   Temp 97.9 F (36.6 C) (Oral)   Resp (!) 22   Ht 5' 8" (1.727 m)   Wt 58.5 kg   SpO2 92%   BMI 19.61 kg/m  SpO2: SpO2: 92 % O2 Device: O2 Device:  Nasal Cannula O2 Flow Rate: O2 Flow Rate (L/min): 2 L/min  Intake/output summary:   Intake/Output Summary (Last 24 hours) at 04/15/2021 1151 Last data filed at 04/15/2021 1100 Gross per 24 hour  Intake 4575.65 ml  Output 700 ml  Net 3875.65 ml   LBM: Last BM Date: 04/15/21 Baseline Weight: Weight: 54 kg Most recent weight: Weight: 58.5 kg      PPS 40% Palliative Assessment/Data:      Patient Active Problem List   Diagnosis Date Noted  . Palliative care by specialist   . Goals of care, counseling/discussion   . General weakness   . Severe protein-calorie malnutrition ([place]) 04/10/2021  . Pseudomonas aeruginosa infection   . SIRS (systemic inflammatory response syndrome) (HCC)   . Cellulitis, leg 04/10/2021  . Wound cellulitis 03/31/2021  . Open wound   . PAF (paroxysmal atrial fibrillation) ([place])   . AKI (acute kidney injury) ([place]) 02/07/2021  . Macrocytic anemia 02/07/2021  . Thrombocytosis 02/07/2021  . PAH (pulmonary artery hypertension) ([place]) 02/07/2021  . Chronic diastolic CHF (congestive heart failure) ([place]) 02/07/2021  . Non-healing wound of right lower extremity 02/07/2021  . Physical deconditioning 02/07/2021  . CREST syndrome ([place])   . Iron deficiency anemia due to chronic blood loss 01/26/2021  . Anemia due to chronic blood loss 01/18/2021  . Anxiety state 01/18/2021  . 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 Disorder of calcium metabolism 01/18/2021  . Gastro-esophageal reflux disease with esophagitis, without bleeding 01/18/2021  . Oxygen dependent 01/18/2021  . Ulcer of esophagus with bleeding 01/18/2021  . Gangrene of finger of right hand  ([place]) 11/29/2020  . Malnutrition of moderate degree 11/22/2020  . GI bleed 11/21/2020  . Eustachian tube dysfunction, bilateral 07/29/2020  . 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 Wrist fracture 11/24/2019  . Multiple closed stable fractures of pubic ramus ([place]) 11/23/2019  . Distal radius fracture, left 11/23/2019  . Fall at home, initial encounter 11/23/2019  . AMS (altered mental status) 11/12/2019  . Altered mental status 11/11/2019  . Hypomagnesemia 11/11/2019  . Osteoporosis 01/22/2019  . Hypothyroidism 06/16/2018  . Pulmonary hypertension associated with systemic disorder ([place]) 10/08/2017  . 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 Dry gangrene ([place]) 09/11/2017  . Medication reaction 09/11/2017  . ILD (interstitial lung disease) ([place]) 07/22/2017  . Nasal septal perforation 07/10/2017  . Chronic respiratory failure with hypoxia ([place]) 10/31/2016  . Atherosclerosis of native arteries of extremities with gangrene, right leg ([place]) 09/14/2016  . Prolonged Q-T interval on ECG 07/29/2016  . CKD (chronic kidney disease), stage III ([place]) 07/29/2016  . Scleroderma ([place]) 05/06/2016  . Depression 04/24/2016  . Extensor tenosynovitis of wrist, right 02/24/2016  . 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 Hypokalemia 10/07/2015  . 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[place]) 09/01/2015  . Allergic rhinitis 03/08/2015  . High risk medication use 04/09/2013  . Venous hypertension, chronic, with ulcer and inflammation ([place]) 01/29/2013  . Lymphoma, non-Hodgkin's ([place]) 01/27/2013  . Right heart failure ([place]) 01/27/2013  . Varicose veins of lower extremities with ulcer and inflammation ([place]) 07/24/2012  . 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[place]) 04/08/2012  . Pulmonary hypertension associated with systemic disorder ([place]) 03/25/2012  . Peripheral arterial occlusive disease ([place])  08/10/2011  . Essential hypertension 08/10/2011  . Hyperlipidemia 08/10/2011  . Arterial embolus and thrombosis of lower extremity (HCC) 08/10/2011    Palliative Care Assessment &  Plan   Patient Profile:  70 y.o.femalewith medical history significant forwith PMH of scleroderma diagnosed in 1968, crest syndrome, pulmonary HTN, on continuous oxygen between 2-3L [place], non Hodgkin lymphomaof the brains/p chemotherapy in remission, PAD, severe PVD s/p multiple stents and ablations, CHF, DVT s/p IVC filter-not on anticoagulation due to GI bleed, anemiarequiring IV iron transfusion, chronic leg ulcers with chronic pseudomonas colonizationwho presents as a transfer from [name] for AMS and worsening chronic leg wounds.    Patient remains admitted to hospital medicine service, with ID colleagues also following, for pseudomonas aeruginosa bacteremia, likely with osteomyelitis foot, AKI on CKD. It is noted that patient reportedly declined amputation in the past. Remains on antibiotics. Palliative consult for goals of care discussions has been requested.   Assessment: More awake today.  Appears in mild distress  Recommendations/Plan:   full code full scope care for now: re discussed with husband today. He remains hopeful for ongoing stabilization/recovery. will benefit from outpatient palliative support when stable enough to be discharged. No additional PMT specific intervention or recommendation for today.    Trial of artificial nutrition and current treatments.       Goals of Care and Additional Recommendations:  Limitations on Scope of Treatment: Full Scope Treatment  Code Status:    Code Status Orders  (From admission, onward)         Start     Ordered   04/20/2021 2323  Full code  Continuous        04/12/2021 2323        Code Status History    Date Active Date Inactive Code Status Order ID Comments User Context   02/07/2021 1344 02/10/2021 2358 Full Code [id number]  Dwyane Dee,  MD ED   01/02/2021 1404 01/02/2021 2329 Full Code [id number]  Waynetta Sandy, MD Inpatient   11/21/2020 1747 11/24/2020 0106 Full Code [id number]  Hosie Poisson, MD ED   10/04/2020 1200 10/04/2020 2140 Full Code [id number]  Serafina Mitchell, MD Inpatient   11/23/2019 1807 11/26/2019 1603 Full Code [id number]  Norval Morton, MD ED   11/13/2019 1032 11/15/2019 1822 DNR [id number]  Domenic Polite, MD Inpatient   11/11/2019 1835 11/13/2019 1032 Full Code [id number]  Barb Merino, MD ED   04/19/2017 0956 04/19/2017 1411 Full Code [id number]  Bensimhon, Shaune Pascal, MD Inpatient   09/14/2016 2000 09/15/2016 1342 Full Code [id number]  Alvia Grove, PA-C Inpatient   09/07/2016 1107 09/07/2016 2014 Full Code [id number]  [name], MD Inpatient   07/29/2016 1214 08/01/2016 1719 Full Code [id number]  Willia Craze, NP ED   05/06/2016 2036 05/08/2016 1649 Full Code [id number]  Reubin Milan, MD Inpatient   10/05/2015 1752 10/08/2015 1502 Full Code [id number]  Eugenie Filler, MD Inpatient   12/17/2013 1036 12/18/2013 1643 Full Code [id number]  Conrad San Isidro, NP Inpatient   Advance Care Planning Activity    Advance Directive Documentation   Flowsheet Row Most Recent Value  Type of Advance Directive Living will  Pre-existing out of facility DNR order (yellow form or pink MOST form) --  "MOST" Form in Place? --       Prognosis:   Unable to determine  Discharge Planning:  To Be Determined  Care plan was discussed with  Patient's husband at bedside.   Thank you for allowing the Palliative Medicine Team to assist in the care of this patient.   Time In: 10 Time Out: 10.25 Total Time 25 Prolonged Time Billed  no  Greater than 50%  of this time was spent counseling and coordinating care related to the above assessment and plan.  Loistine Chance, MD  Please contact Palliative Medicine Team phone at [phone] for questions and concerns.

## 2021-04-15 NOTE — Progress Notes (Signed)
PHARMACY NOTE -  [name] [name] has been assisting with dosing of Zosyn for PsA bacteremia.  Dosage remains stable at 3.375 g IV q8 hr and further renal adjustments per institutional Pharmacy antibiotic protocol  Repeat BCx clear  Pharmacy will sign off, following peripherally for culture results or dose adjustments. Please reconsult if a change in clinical status warrants re-evaluation of dosage.  Reuel Boom, PharmD, BCPS [phone] 04/15/2021, 2:27 PM

## 2021-04-15 NOTE — Progress Notes (Signed)
Hypoglycemic Event  CBG: 44  Treatment: D50  Symptoms: pt lethargic  Follow-up CBG: Time  CBG Result:  Possible Reasons for Event: Pt on tube feed and D5 Comments/MD notified:Dr Ogbata notified    Mckynna Vanloan, Theodosia Paling

## 2021-04-16 ENCOUNTER — Inpatient Hospital Stay (HOSPITAL_COMMUNITY): Payer: Medicare Other

## 2021-04-16 ENCOUNTER — Other Ambulatory Visit: Payer: Self-pay

## 2021-04-16 DIAGNOSIS — L03115 Cellulitis of right lower limb: Secondary | ICD-10-CM | POA: Diagnosis not present

## 2021-04-16 DIAGNOSIS — N179 Acute kidney failure, unspecified: Secondary | ICD-10-CM | POA: Diagnosis not present

## 2021-04-16 DIAGNOSIS — J9621 Acute and chronic respiratory failure with hypoxia: Secondary | ICD-10-CM

## 2021-04-16 DIAGNOSIS — J849 Interstitial pulmonary disease, unspecified: Secondary | ICD-10-CM | POA: Diagnosis not present

## 2021-04-16 DIAGNOSIS — J9622 Acute and chronic respiratory failure with hypercapnia: Secondary | ICD-10-CM

## 2021-04-16 LAB — GLUCOSE, CAPILLARY
Glucose-Capillary: 148 mg/dL — ABNORMAL HIGH (ref 70–99)
Glucose-Capillary: 198 mg/dL — ABNORMAL HIGH (ref 70–99)
Glucose-Capillary: 198 mg/dL — ABNORMAL HIGH (ref 70–99)
Glucose-Capillary: 230 mg/dL — ABNORMAL HIGH (ref 70–99)
Glucose-Capillary: 39 mg/dL — CL (ref 70–99)
Glucose-Capillary: 57 mg/dL — ABNORMAL LOW (ref 70–99)
Glucose-Capillary: 80 mg/dL (ref 70–99)

## 2021-04-16 LAB — CBC
HCT: 24.5 % — ABNORMAL LOW (ref 36.0–46.0)
Hemoglobin: 7.2 g/dL — ABNORMAL LOW (ref 12.0–15.0)
MCH: 30.1 pg (ref 26.0–34.0)
MCHC: 29.4 g/dL — ABNORMAL LOW (ref 30.0–36.0)
MCV: 102.5 fL — ABNORMAL HIGH (ref 80.0–100.0)
Platelets: 202 10*3/uL (ref 150–400)
RBC: 2.39 MIL/uL — ABNORMAL LOW (ref 3.87–5.11)
RDW: 21.3 % — ABNORMAL HIGH (ref 11.5–15.5)
WBC: 15.1 10*3/uL — ABNORMAL HIGH (ref 4.0–10.5)
nRBC: 0.2 % (ref 0.0–0.2)

## 2021-04-16 LAB — BASIC METABOLIC PANEL
Anion gap: 10 (ref 5–15)
BUN: 78 mg/dL — ABNORMAL HIGH (ref 8–23)
CO2: 23 mmol/L (ref 22–32)
Calcium: 7.9 mg/dL — ABNORMAL LOW (ref 8.9–10.3)
Chloride: 105 mmol/L (ref 98–111)
Creatinine, Ser: 1.63 mg/dL — ABNORMAL HIGH (ref 0.44–1.00)
GFR, Estimated: 34 mL/min — ABNORMAL LOW (ref >60)
Glucose, Bld: 221 mg/dL — ABNORMAL HIGH (ref 70–99)
Potassium: 4.1 mmol/L (ref 3.5–5.1)
Sodium: 138 mmol/L (ref 135–145)

## 2021-04-16 LAB — D-DIMER, QUANTITATIVE: D-Dimer, Quant: 14.76 ug/mL-FEU — ABNORMAL HIGH (ref 0.00–0.50)

## 2021-04-16 LAB — BLOOD GAS, ARTERIAL
Acid-Base Excess: 1.5 mmol/L (ref 0.0–2.0)
Bicarbonate: 24.1 mmol/L (ref 20.0–28.0)
FIO2: 40
O2 Saturation: 93.7 %
Patient temperature: 98.6
pCO2 arterial: 30.6 mmHg — ABNORMAL LOW (ref 32.0–48.0)
pH, Arterial: 7.508 — ABNORMAL HIGH (ref 7.350–7.450)
pO2, Arterial: 63.8 mmHg — ABNORMAL LOW (ref 83.0–108.0)

## 2021-04-16 LAB — BRAIN NATRIURETIC PEPTIDE: B Natriuretic Peptide: 1344.8 pg/mL — ABNORMAL HIGH (ref 0.0–100.0)

## 2021-04-16 MED ORDER — FUROSEMIDE 10 MG/ML IJ SOLN
20.0000 mg | Freq: Once | INTRAMUSCULAR | Status: AC
  Administered 2021-04-16: 20 mg via INTRAVENOUS
  Filled 2021-04-16: qty 2

## 2021-04-16 MED ORDER — DEXTROSE 50 % IV SOLN: 25.0000 g | INTRAVENOUS | Status: AC

## 2021-04-16 MED ORDER — DEXTROSE 50 % IV SOLN: 12.5000 g | INTRAVENOUS | Status: AC

## 2021-04-16 MED ORDER — ENOXAPARIN SODIUM 60 MG/0.6ML ~~LOC~~ SOLN
60.0000 mg | SUBCUTANEOUS | Status: DC
  Administered 2021-04-16: 60 mg via SUBCUTANEOUS
  Filled 2021-04-16: qty 0.6

## 2021-04-16 MED ORDER — DEXTROSE 50 % IV SOLN
INTRAVENOUS | Status: AC
  Administered 2021-04-16: 12.5 g via INTRAVENOUS
  Filled 2021-04-16: qty 50

## 2021-04-16 MED ORDER — TECHNETIUM TO 99M ALBUMIN AGGREGATED
3.7000 | Freq: Once | INTRAVENOUS | Status: AC
  Administered 2021-04-16: 3.7 via INTRAVENOUS

## 2021-04-16 NOTE — Progress Notes (Signed)
[place] for Enoxaparin Indication: pulmonary embolus  Allergies  Allergen Reactions  . Levofloxacin Other (See Comments)    FLU LIKE SYMPTOM   . Sulfa Antibiotics Rash and Other (See Comments)    Face peeled, Severe rash with significant peeling of face. No airway involvement per patient   . Doxycycline Hyclate Swelling    SWELLING REACTION UNSPECIFIED ; "TABLETS ONLY - CAPSULES ARE TOLERATED FINE"    Patient Measurements: Height: _0  (172.7 cm) Weight: 57.8 kg (127 lb 6.8 oz) IBW/kg (Calculated) : 63.9 Heparin Dosing Weight:   Vital Signs: Temp: 98 F (36.7 C) (04/17 1647) Temp Source: Axillary (04/17 1647) BP: 138/78 (04/17 1600) Pulse Rate: 85 (04/17 1600)  Labs: Recent Labs    04/14/21 2138 04/15/21 0023 04/15/21 0908 04/16/21 0238  HGB 7.6* 7.8*  --  7.2*  HCT 26.3* 27.4*  --  24.5*  PLT 181 189  --  202  CREATININE 1.74* 1.92* 1.77* 1.63*    Estimated Creatinine Clearance: 28.9 mL/min (A) (by C-G formula based on SCr of 1.63 mg/dL (H)).   Medications:  Scheduled:  . Chlorhexidine Gluconate Cloth  6 each Topical Q0600  . citalopram  20 mg Per Tube QHS  . clopidogrel  75 mg Per Tube Daily  . cycloSPORINE  2 drop Both Eyes BID  . feeding supplement  237 mL Oral BID BM  . folic acid  1 mg Per Tube Daily  . hydrocortisone sod succinate (SOLU-CORTEF) inj  50 mg Intravenous Daily  . levothyroxine  50 mcg Per Tube Q0600  . magnesium oxide  200 mg Per Tube BID  . mouth rinse  15 mL Mouth Rinse BID  . multivitamin with minerals  1 tablet Per Tube Daily  . nutrition supplement (JUVEN)  1 packet Per Tube BID WC  . pantoprazole (PROTONIX) IV  40 mg Intravenous Q24H  . vitamin B-12  1,000 mcg Per Tube Daily   Infusions:  . sodium chloride Stopped (04/12/21 0600)  . dextrose Stopped (04/16/21 1057)  . dextrose 5 % and 0.9% NaCl Stopped (04/12/21 0935)  . feeding supplement (OSMOLITE 1.2 CAL) Stopped (04/16/21 1400)   . piperacillin-tazobactam (ZOSYN)  IV 3.375 g (04/16/21 1717)    Assessment: 34 yoF admitted on 4/7 with altered mental status .  Pharmacy is consulted to dose enoxaparin for PE.  She has been on Heparin 5000 units SQ since 4/8 and only missed one dose. D-dimer 14.76 SCr elevated but improving (1.9 > 1.77 > 1.6) with baseline SCr 1.1-1.2 CrCl ~ 29 ml/min CBC:  Hgb remains low, decreased at 7.2.  Plt WNL  Goal of Therapy:  Anti-Xa level 0.6-1 units/ml 4hrs after LMWH dose given Monitor platelets by anticoagulation protocol: Yes   Plan:  Enoxaparin 1 mg/kg (70m) SQ q24h Follow up renal function and CBC  CGretta ArabPharmD, BCPS Clinical Pharmacist [facility] main pharmacy [phone]/17/2022 5:51 PM

## 2021-04-16 NOTE — Plan of Care (Signed)
  Problem: Education: Goal: Knowledge of General Education information will improve Description: Including pain rating scale, medication(s)/side effects and non-pharmacologic comfort measures Outcome: Not Progressing   Problem: Nutrition: Goal: Adequate nutrition will be maintained Outcome: Progressing   Problem: Pain Managment: Goal: General experience of comfort will improve Outcome: Progressing   Problem: Safety: Goal: Ability to remain free from injury will improve Outcome: Progressing

## 2021-04-16 NOTE — Progress Notes (Signed)
PROGRESS NOTE  [name] [name]  DOB: [birth year]  PCP: [name], MD VQQ:[id number]  DOA: 04/08/2021  LOS: 10 days   Chief Complaint  Patient presents with  . Altered Mental Status    Brief narrative: [name] [name] is a 70 y.o. 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 Patient was brought from home to the ED on 04/28/2021 for altered mental status and worsening of chronic leg wounds.  During the course of this hospitalization, patient was noted to have Pseudomonas aeruginosa bacteremia likely due to osteomyelitis of her foot.  Patient continued to decline amputation like she did in the past. ID, palliative consultations were obtained. Patient hospitalized and is prolonged because of persistently altered mental status change. See below for details  04/14/2021: Patient seen alongside patient's husband.  Patient remains minimally responsive.  Patient opens her eyes intermittently.  Otherwise, no significant communication with the patient.  Prognosis remains guarded  04/15/2021: Patient was transferred to higher level of care last night.  Patient remains same.  Concerns for low blood sugar, however, fingersticks may not be accurate.  Slight improvement in patient's encephalopathy.  04/16/2021: Patient seen alongside patient's husband and nurse.  Patient is more awake today.  However, patient's breathing has significantly worsened.  We will hold IV fluids.  We will get an ABG, chest x-ray and a troponin.  We will also check cardiac BNP and D-dimer.  Echocardiogram done in February of this year reviewed, normal left ventricular  ejection fraction with severely reduced right ventricular ejection fraction, severe pulmonary hypertension with cor pulmonale, moderate to severe tricuspid regurgitation.  Cardiac BNP is elevated.  However, patient has chronically elevated her cardiac BNP.  Chest x-ray revealed atelectasis on the left side.  For trial of BiPAP and diuretics.  Guarded/poor prognosis.  Subjective: Patient is short of breath.  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  In the past, she refused amputation.   -Presented with worsening wound, leukocytosis to 23,000, elevated lactic acid to 2.4. -4/8, MRI left foot showed abnormal edema distally in the prox phalanx of the small toe suspicious for active osteomyelitis.  -Blood culture from 4/7 grew Pseudomonas aeruginosa.  Repeat blood culture on 4/9 negative -Currently on renally dosed Zosyn. -Since patient has been traditionally refusing amputation, she may need prolonged antibiotic course.  -Wound care consult obtained.  But the extent of the wound is beyond the expertise of wound care.  -4/14, consultation seen by Dr. Sharol Given.  For the extent of her wound, ideally she would need amputation of both her lower extremities but because of her frailty, she is not a candidate for any surgical intervention at this time.  -White count and lactic acid level improved to normal. 04/16/2021: Continue antibiotics. Recent Labs  Lab 04/09/21 2202 04/10/21 0648 04/13/21 0541 04/14/21 0531 04/14/21 2138 04/15/21 0023 04/16/21 0238  WBC  --    < > 10.2 11.2* 14.4* 13.2* 15.1*  LATICACIDVEN 1.8  --   --   --  1.9 1.8  --    < > = values in this interval not displayed.   Acute metabolic  encephalopathy -Unclear etiology.  Probably multifactorial: Dehydration, hypernatremia, infection, scleroderma, history of craniectomy -4/12 CT head did not show any acute abnormality. 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 -On dextrose drip because of hypernatremia AKI on CKD 3B -Creatinine at baseline less than 1.4.  Currently elevated and continues to trend up despite hydration and nutrition -Monitor on IV fluid. 04/14/2021: Serum creatinine today is 1.91.  AKI is likely multifactorial.  Suspect combined prerenal and ATN. 04/15/2021: Check urine sodium, UPC, ANA, C3 and C4.  Continue to monitor renal function.  Serum creatinine is 1.92. 04/16/2021: AKI is slowly improving.  Serum creatinine is 1.63. Recent Labs    04/09/21 2202 04/10/21 0648 04/11/21 0525 04/12/21 0553 04/13/21 0541 04/14/21 0531 04/14/21 2138 04/15/21 0023 04/15/21 0908 04/16/21 0238  BUN 44* 45* 39* 43* 55* 76* 74* 72* 73* 78*  CREATININE 1.44* 1.50* 1.75* 1.86* 1.93* 1.91* 1.74* 1.92* 1.77* 1.63*   Hypernatremia -Sodium level is running high because of poor oral intake -4/13, IV fluid was switched to dextrose drip. Currently running at 75 mL/h.  Sodium level improved from 155 yesterday to 151 today.  Continue to monitor sodium level. 04/15/2021: Resolved.  Sodium is 143 today. 04/16/2021: Resolved.  Sodium is 138. Recent Labs  Lab 04/09/21 2202 04/10/21 0648 04/11/21 0525 04/12/21 0553 04/13/21 0541 04/14/21 0531 04/14/21 2138 04/15/21 0023 04/15/21 0908 04/16/21 0238  NA 142 140 148* 155* 151* 144 141 143 143 138   Hypokalemia/hypophosphatemia -Labs this morning with phosphorus level significantly low at 1.1.  Discussed with pharmacy.  Replacement ordered.  Recheck tomorrow. 04/15/2021: Resolved.  Potassium is 4.9  and phosphorus is 2.5 today. 04/16/2021: Resolved.  Potassium is 4.1 today. Recent Labs  Lab 04/12/21 1141 04/12/21 1730 04/13/21 0541 04/13/21 1732 04/14/21 0531 04/14/21 2138 04/15/21 0023 04/15/21 0908 04/16/21 0238  K  --   --  4.1  --  4.0 4.3 4.9 4.1 4.1  MG 2.3 2.4 2.1 2.0  --   --   --   --   --   PHOS 1.7* 2.1* 1.1*  --  1.3*  --  2.5  --   --    Anemia of chronic disease  hx of GI bleed -Baseline hemoglobin between 9-10. -No active bleeding but hemoglobin this admission is running between 8-9.   -Continue monitor Recent Labs    07/08/20 1134 10/04/20 0629 11/21/20 1603 11/22/20 0310 01/10/21 0933 01/24/21 0000 01/26/21 1508 01/26/21 1509 02/05/21 1608 02/08/21 0530 02/09/21 0500 03/03/21 1521 03/03/21 1522 04/05/2021 1539 04/13/21 0541 04/14/21 0531 04/14/21 2138 04/15/21 0023 04/16/21 0238  HGB 10.0*   < > 5.3*   < > 8.6*   < > 9.3*  --    < >  --    < >  --  10.1*   < > 8.3* 8.1* 7.6* 7.8* 7.2*  MCV 100.0   < > 113.1*   < > 95.1   < > 101.0*  --    < >  --    < >  --  96.4   < > 106.3* 104.8* 106.5* 105.0* 102.5*  VITAMINB12  --   --  742  --  675  --  865  --   --  1,429*  --   --   --   --   --   --   --   --   --   FOLATE  --   --  106.8  --  21.3  --   --  94.9  --  58.1  --   --   --   --   --   --   --   --   --   FERRITIN  --   --  195  --  179  --  270  --   --   --   --   --  786*  --   --   --   --   --   --   TIBC  --   --  376  --  269  --  270  --   --   --   --   --  198*  --   --   --   --   --   --   IRON 95  --  39  --  41*  --  29*  --   --   --   --   --  21*  --   --   --   --   --   --   RETICCTPCT  --   --  10.5*  --   --   --  2.4  --   --   --   --  1.3  --   --   --   --   --   --   --    < > = values in this interval not displayed.   Scleroderma Crest syndrome  interstitial lung disease with chronic hypoxemia Pulmonary hypertension -Chronically on prednisone.  Currently on IV hydrocortisone 50 mg daily.   -On Revatio and  macitentan for pulmonary hypertension -On 2 to 3 L oxygen by nasal cannula at baseline -Follows with outpatient pulmonology  PAD -Plavix as tolerated  Paroxysmal atrial fibrillation -Not on anticoagulation due to previous GI bleed  History of DVT -Has IVC filter in place -Seems to be stable at this time  Hyperlipidemia -Off statin due to myalgia  Hypoalbuminemia: -Likely multifactorial. -May reflect malnutrition. -May have prognostic significance.  Goals of care -Patient's overall clinical status seems to be worsening.  Palliative care consultation was obtained.  Husband chose to keep her full code.  -Yesterday and today, I had a long conversation with him about patient's worsening health condition.  Patient is at high risk of in-hospital death which I clearly stated to him.  My recommendation would be to consider comfort care at this time.  Has been remains hopeful especially after she was started on NG feeding yesterday he wants to wait next few days before deciding differently.    Mobility: may encourage ambulation after mental status improves Code Status:   Code Status: Full Code  Nutritional status: Body mass index is 19.37 kg/m. Nutrition Problem: Severe Malnutrition Etiology: chronic illness,wound healing Signs/Symptoms: severe fat depletion,severe muscle depletion Diet Order            Diet NPO time specified  Diet effective now                 DVT prophylaxis: heparin injection 5,000 Units Start: 04/07/21 1500   Antimicrobials:  IV Zosyn Fluid: Dextrose drip at 75 mL/h Consultants: Palliative care, ID Family Communication:  Husband at bedside  Status  is: Inpatient  Remains inpatient appropriate because -continue to remain altered  Dispo: The patient is from: Home              Anticipated d/c is to: Home versus SNF              Patient currently is not medically stable to d/c.   Difficult to place patient No       Infusions:  . sodium  chloride Stopped (04/12/21 0600)  . dextrose Stopped (04/16/21 1057)  . dextrose 5 % and 0.9% NaCl Stopped (04/12/21 0935)  . feeding supplement (OSMOLITE 1.2 CAL) 1,000 mL (04/16/21 0423)  . piperacillin-tazobactam (ZOSYN)  IV Stopped (04/16/21 1221)    Scheduled Meds: . Chlorhexidine Gluconate Cloth  6 each Topical Q0600  . citalopram  20 mg Per Tube QHS  . clopidogrel  75 mg Per Tube Daily  . cycloSPORINE  2 drop Both Eyes BID  . feeding supplement  237 mL Oral BID BM  . folic acid  1 mg Per Tube Daily  . furosemide  20 mg Intravenous Once  . heparin  5,000 Units Subcutaneous Q8H  . hydrocortisone sod succinate (SOLU-CORTEF) inj  50 mg Intravenous Daily  . levothyroxine  50 mcg Per Tube Q0600  . magnesium oxide  200 mg Per Tube BID  . mouth rinse  15 mL Mouth Rinse BID  . multivitamin with minerals  1 tablet Per Tube Daily  . nutrition supplement (JUVEN)  1 packet Per Tube BID WC  . pantoprazole (PROTONIX) IV  40 mg Intravenous Q24H  . vitamin B-12  1,000 mcg Per Tube Daily    Antimicrobials: Anti-infectives (From admission, onward)   Start     Dose/Rate Route Frequency Ordered Stop   04/13/21 0600  piperacillin-tazobactam (ZOSYN) IVPB 3.375 g        3.375 g 12.5 mL/hr over 240 Minutes Intravenous Every 8 hours 04/12/21 1055     04/12/21 0600  ceFEPIme (MAXIPIME) 2 g in sodium chloride 0.9 % 100 mL IVPB  Status:  Discontinued        2 g 200 mL/hr over 30 Minutes Intravenous Every 24 hours 04/11/21 0712 04/12/21 1055   04/08/21 0600  vancomycin (VANCOREADY) IVPB 1000 mg/200 mL  Status:  Discontinued        1,000 mg 200 mL/hr over 60 Minutes Intravenous Every 36 hours 04/07/21 0059 04/08/21 0828   04/07/21 0600  ceFEPIme (MAXIPIME) 2 g in sodium chloride 0.9 % 100 mL IVPB  Status:  Discontinued        2 g 200 mL/hr over 30 Minutes Intravenous Every 12 hours 04/07/21 0059 04/11/21 0712   04/09/2021 1715  vancomycin (VANCOCIN) IVPB 1000 mg/200 mL premix        1,000 mg 200  mL/hr over 60 Minutes Intravenous  Once 04/25/2021 1711 04/25/2021 2045   04/04/2021 1715  ceFEPIme (MAXIPIME) 2 g in sodium chloride 0.9 % 100 mL IVPB        2 g 200 mL/hr over 30 Minutes Intravenous  Once 04/14/2021 1711 04/28/2021 1800      PRN meds: sodium chloride, acetaminophen, HYDROmorphone (DILAUDID) injection, oxyCODONE-acetaminophen   Objective: Vitals:   04/16/21 1200 04/16/21 1300  BP: (!) 175/60 139/61  Pulse: 96 90  Resp: (!) 30 (!) 30  Temp: 98.4 F (36.9 C)   SpO2: 97% 90%    Intake/Output Summary (Last 24 hours) at 04/16/2021 1407 Last data filed at 04/16/2021 1308 Gross per 24 hour  Intake 2806.7  ml  Output 700 ml  Net 2106.7 ml   Filed Weights   04/14/21 2230 04/15/21 0300 04/16/21 0652  Weight: 58.5 kg 58.5 kg 57.8 kg   Weight change: -0.7 kg Body mass index is 19.37 kg/m.   Physical Exam: General exam: Chronically ill looking.  Encephalopathy has improved.  Short of breath.   HEENT: Patient is pale.  No jaundice.   Lungs: Clear to auscultation bilaterally CVS: S1-S2.   GI/Abd soft, nontender, nondistended, bowel sound present CNS: Patient remains encephalopathic.  Patient opens eyes intermittently.   Extremities: Chronic wound on the legs bandaged. .  Data Review: I have personally reviewed the laboratory data and studies available.  Recent Labs  Lab 04/13/21 0541 04/14/21 0531 04/14/21 2138 04/15/21 0023 04/16/21 0238  WBC 10.2 11.2* 14.4* 13.2* 15.1*  NEUTROABS  --   --  12.1*  --   --   HGB 8.3* 8.1* 7.6* 7.8* 7.2*  HCT 28.9* 28.3* 26.3* 27.4* 24.5*  MCV 106.3* 104.8* 106.5* 105.0* 102.5*  PLT 219 193 181 189 202   Recent Labs  Lab 04/12/21 1141 04/12/21 1730 04/13/21 0541 04/13/21 1732 04/14/21 0531 04/14/21 2138 04/15/21 0023 04/15/21 0908 04/16/21 0238  NA  --   --  151*  --  144 141 143 143 138  K  --   --  4.1  --  4.0 4.3 4.9 4.1 4.1  CL  --   --  121*  --  114* 110 112* 110 105  CO2  --   --  22  --  24 20* 20* 24 23   GLUCOSE  --   --  129*  --  156* 204* 142* 255* 221*  BUN  --   --  55*  --  76* 74* 72* 73* 78*  CREATININE  --   --  1.93*  --  1.91* 1.74* 1.92* 1.77* 1.63*  CALCIUM  --   --  8.5*  --  8.1* 7.8* 7.9* 8.1* 7.9*  MG 2.3 2.4 2.1 2.0  --   --   --   --   --   PHOS 1.7* 2.1* 1.1*  --  1.3*  --  2.5  --   --     F/u labs ordered Unresulted Labs (From admission, onward)          Start     Ordered   04/16/21 1347  D-dimer, quantitative  Once,   R        04/16/21 1346   04/15/21 1134  ANA, IFA (with reflex)  Once,   R       Question:  Specimen collection method  Answer:  Lab=Lab collect   04/15/21 1133   04/15/21 1134  C3 complement  Once,   R       Question:  Specimen collection method  Answer:  Lab=Lab collect   04/15/21 1133   04/15/21 1134  C4 complement  Once,   R       Question:  Specimen collection method  Answer:  Lab=Lab collect   04/15/21 1133   04/13/21 2836  Basic metabolic panel  Daily,   R     Question:  Specimen collection method  Answer:  Lab=Lab collect   04/12/21 1301   04/13/21 0500  CBC  Daily,   R     Question:  Specimen collection method  Answer:  Lab=Lab collect   04/12/21 1301   Unscheduled  Occult blood card to lab, stool RN will  collect  As needed,   R     Question:  Specimen to be collected by:  Answer:  RN will collect   04/07/21 0030          Signed, Bonnell Public, MD [facility] 04/16/2021

## 2021-04-16 NOTE — Progress Notes (Signed)
Dressings on legs changed by husband.

## 2021-04-16 NOTE — Progress Notes (Signed)
Placed patient on BiPAP per MD's order.

## 2021-04-17 LAB — BASIC METABOLIC PANEL
Anion gap: 16 — ABNORMAL HIGH (ref 5–15)
BUN: 79 mg/dL — ABNORMAL HIGH (ref 8–23)
CO2: 29 mmol/L (ref 22–32)
Calcium: 8.4 mg/dL — ABNORMAL LOW (ref 8.9–10.3)
Chloride: 100 mmol/L (ref 98–111)
Creatinine, Ser: 1.83 mg/dL — ABNORMAL HIGH (ref 0.44–1.00)
GFR, Estimated: 29 mL/min — ABNORMAL LOW (ref >60)
Glucose, Bld: 167 mg/dL — ABNORMAL HIGH (ref 70–99)
Potassium: 3.9 mmol/L (ref 3.5–5.1)
Sodium: 145 mmol/L (ref 135–145)

## 2021-04-17 LAB — CBC
HCT: 25.5 % — ABNORMAL LOW (ref 36.0–46.0)
Hemoglobin: 7.8 g/dL — ABNORMAL LOW (ref 12.0–15.0)
MCH: 30.5 pg (ref 26.0–34.0)
MCHC: 30.6 g/dL (ref 30.0–36.0)
MCV: 99.6 fL (ref 80.0–100.0)
Platelets: 242 10*3/uL (ref 150–400)
RBC: 2.56 MIL/uL — ABNORMAL LOW (ref 3.87–5.11)
RDW: 21.5 % — ABNORMAL HIGH (ref 11.5–15.5)
WBC: 19.9 10*3/uL — ABNORMAL HIGH (ref 4.0–10.5)
nRBC: 0.5 % — ABNORMAL HIGH (ref 0.0–0.2)

## 2021-04-17 LAB — C3 COMPLEMENT: C3 Complement: 96 mg/dL (ref 82–167)

## 2021-04-17 LAB — C4 COMPLEMENT: Complement C4, Body Fluid: 9 mg/dL — ABNORMAL LOW (ref 12–38)

## 2021-04-17 LAB — GLUCOSE, CAPILLARY
Glucose-Capillary: 115 mg/dL — ABNORMAL HIGH (ref 70–99)
Glucose-Capillary: 138 mg/dL — ABNORMAL HIGH (ref 70–99)

## 2021-04-17 MED ORDER — DEXTROSE 50 % IV SOLN
INTRAVENOUS | Status: AC
  Administered 2021-04-17: 25 g via INTRAVENOUS
  Filled 2021-04-17: qty 50

## 2021-04-17 MED ORDER — DEXTROSE 10 % IV SOLN: INTRAVENOUS | Status: DC

## 2021-04-17 MED ORDER — HYDROMORPHONE HCL 1 MG/ML IJ SOLN
1.0000 mg | INTRAMUSCULAR | Status: DC | PRN
  Administered 2021-04-17: 1 mg via INTRAVENOUS
  Filled 2021-04-17: qty 1

## 2021-04-18 LAB — ANTINUCLEAR ANTIBODIES, IFA: ANA Ab, IFA: POSITIVE — AB

## 2021-04-18 LAB — FANA STAINING PATTERNS
CENTROMERE AB: 3 — ABNORMAL HIGH
Homogeneous Pattern: 1 — ABNORMAL HIGH

## 2021-04-20 ENCOUNTER — Ambulatory Visit: Payer: Medicare Other | Admitting: Vascular Surgery

## 2021-04-20 ENCOUNTER — Encounter (HOSPITAL_COMMUNITY): Payer: Medicare Other

## 2021-04-30 NOTE — Progress Notes (Signed)
Paged by bedside RN several times throughout the night for pt having several episodes of projectile vomiting and possible aspiration. Pt continues to decline clinically. Discussed this with pt husband over the phone and at bedside. Husband agreed to make pt DNR. Tearfully stating "We have been marriend for over 45 years I don't want her to suffer anymore." Pt made DNR will continue current treatment course.   Lovey Newcomer, NP [facility] 7p-7a [phone]

## 2021-04-30 DEATH — deceased

## 2021-05-31 NOTE — Discharge Summary (Signed)
Physician Discharge Summary  [name] [name] YSH:[id number] DOB: 02/05/1951 DOA: 2021/04/18  PCP: [name], MD  Admit date: April 18, 2021 Died on: 04/29/21    Diagnoses:    Pseudomonas bacteremia/sepsis   Cellulitis, leg   Acute encephalopathy, likely combined metabolic and toxic.   Hypernatremia   Hypokalemia   Hypophosphatemia   Peripheral arterial occlusive disease (HCC)   Pulmonary hypertension associated with systemic disorder (HCC)   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HCC)   Scleroderma (HCC)   Atherosclerosis of native arteries of extremities with gangrene, right leg (HCC)   Chronic respiratory failure with hypoxia (HCC)   ILD (interstitial lung disease) (HCC)   AKI (acute kidney injury) (HCC)   PAF (paroxysmal atrial fibrillation) (HCC)   Wound cellulitis   SIRS (systemic inflammatory response syndrome) ([place])   Pseudomonas aeruginosa infection   Severe protein-calorie malnutrition ([place])   Palliative care by specialist   Goals of care, counseling/discussion   General weakness  Hospital Course: Patient was a 70 year old Caucasian female with past medical history significant for scleroderma diagnosed in 1968, crest syndrome, pulmonary HTN, on continuous oxygen between 2-3L [place], non Hodgkin lymphomaof the brains/p chemotherapy in remission, PAD, severe PVD s/p multiple stents and ablations, CHF, DVT s/p IVC filter-not on anticoagulation due to GI bleed, chronic anemiarequiring IV iron transfusion, 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  Patient was admitted with acute encephalopathy and worsening chronic leg wounds.  Work-up done revealed Pseudomonas bacteremia Sepsis.  Despite aggressive management, patient died on 19-Apr-2021.   Signed:  Dana Allan, MD  [facility] Pager #: [phone] 7PM-7AM contact night coverage as above

## 2021-05-31 NOTE — Discharge Summary (Signed)
[name] [name] WKM:[id number] DOB: 08-02-1951 DOA: 04/16/21  PCP: [name], MD  Admit date: 04-16-2021 Died on: 2021/04/27    Diagnoses:    Pseudomonas bacteremia/sepsis   Cellulitis, leg   Acute encephalopathy, likely combined metabolic and toxic.   Hypernatremia   Hypokalemia   Hypophosphatemia   Peripheral arterial occlusive disease (HCC)   Pulmonary hypertension associated with systemic disorder (HCC)   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HCC)   Scleroderma (HCC)   Atherosclerosis of native arteries of extremities with gangrene, right leg (HCC)   Chronic respiratory failure with hypoxia (HCC)   ILD (interstitial lung disease) (HCC)   AKI (acute kidney injury) (HCC)   PAF (paroxysmal atrial fibrillation) (HCC)   Wound cellulitis   SIRS (systemic inflammatory response syndrome) ([place])   Pseudomonas aeruginosa infection   Severe protein-calorie malnutrition ([place])   Palliative care by specialist   Goals of care, counseling/discussion   General weakness  Hospital Course: Patient was a 70 year old Caucasian female with past medical history significant for scleroderma diagnosed in 1968, crest syndrome, pulmonary HTN, on continuous oxygen between 2-3L [place], non Hodgkin lymphomaof the brains/p chemotherapy in remission, PAD, severe PVD s/p multiple stents and ablations, CHF, DVT s/p IVC filter-not on anticoagulation due to GI bleed, chronic anemiarequiring IV iron transfusion, 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  Patient was admitted with acute encephalopathy and worsening chronic leg wounds.  Work-up done revealed Pseudomonas bacteremia Sepsis.  Despite aggressive management, patient died on 04/19/2021.   Signed:  Dana Allan, MD  [facility] Pager #: [phone] 7PM-7AM contact night coverage as above

## 2021-06-01 ENCOUNTER — Encounter (HOSPITAL_COMMUNITY): Payer: Medicare Other | Admitting: Internal Medicine

## 2021-06-01 ENCOUNTER — Other Ambulatory Visit (HOSPITAL_COMMUNITY): Payer: Medicare Other

## 2021-07-11 ENCOUNTER — Other Ambulatory Visit: Payer: Medicare Other | Admitting: Internal Medicine

## 2021-07-13 ENCOUNTER — Ambulatory Visit: Payer: Medicare Other | Admitting: Internal Medicine

## 2022-06-28 ENCOUNTER — Telehealth: Payer: Self-pay | Admitting: *Deleted

## 2022-06-28 NOTE — Telephone Encounter (Signed)
Received on (02/27/21) via of fax Physician Order from [facility] requesting signature,date,and return.  Given to provider to sign,date, and return.     Physician Order signed,dated, and faxed back to [facility]  Confirmation received and copy scanned into the chart.//AB/CMA
